# Patient Record
Sex: Male | Born: 1945 | Race: White | Hispanic: No | Marital: Married | Smoking: Never smoker
Health system: Southern US, Community
[De-identification: ages and names within clinical notes are randomized; demographics above are authoritative.]

## PROBLEM LIST (undated history)

## (undated) DIAGNOSIS — E785 Hyperlipidemia, unspecified: Secondary | ICD-10-CM

## (undated) DIAGNOSIS — K219 Gastro-esophageal reflux disease without esophagitis: Secondary | ICD-10-CM

## (undated) DIAGNOSIS — I2510 Atherosclerotic heart disease of native coronary artery without angina pectoris: Secondary | ICD-10-CM

## (undated) DIAGNOSIS — E119 Type 2 diabetes mellitus without complications: Secondary | ICD-10-CM

## (undated) DIAGNOSIS — D649 Anemia, unspecified: Secondary | ICD-10-CM

## (undated) DIAGNOSIS — G4700 Insomnia, unspecified: Secondary | ICD-10-CM

## (undated) DIAGNOSIS — I8390 Asymptomatic varicose veins of unspecified lower extremity: Secondary | ICD-10-CM

## (undated) DIAGNOSIS — R5383 Other fatigue: Secondary | ICD-10-CM

## (undated) DIAGNOSIS — I5030 Unspecified diastolic (congestive) heart failure: Secondary | ICD-10-CM

## (undated) DIAGNOSIS — R5381 Other malaise: Secondary | ICD-10-CM

## (undated) DIAGNOSIS — I639 Cerebral infarction, unspecified: Secondary | ICD-10-CM

## (undated) DIAGNOSIS — M545 Low back pain, unspecified: Secondary | ICD-10-CM

## (undated) DIAGNOSIS — I890 Lymphedema, not elsewhere classified: Secondary | ICD-10-CM

## (undated) DIAGNOSIS — J449 Chronic obstructive pulmonary disease, unspecified: Secondary | ICD-10-CM

## (undated) DIAGNOSIS — I10 Essential (primary) hypertension: Secondary | ICD-10-CM

## (undated) DIAGNOSIS — B029 Zoster without complications: Secondary | ICD-10-CM

## (undated) DIAGNOSIS — R079 Chest pain, unspecified: Secondary | ICD-10-CM

## (undated) DIAGNOSIS — E1140 Type 2 diabetes mellitus with diabetic neuropathy, unspecified: Secondary | ICD-10-CM

## (undated) DIAGNOSIS — J4489 Other specified chronic obstructive pulmonary disease: Secondary | ICD-10-CM

## (undated) HISTORY — DX: Anemia, unspecified: D64.9

## (undated) HISTORY — DX: Asymptomatic varicose veins of unspecified lower extremity: I83.90

## (undated) HISTORY — DX: Other fatigue: R53.81

## (undated) HISTORY — DX: Chest pain, unspecified: R07.9

## (undated) HISTORY — DX: Zoster without complications: B02.9

## (undated) HISTORY — DX: Other specified chronic obstructive pulmonary disease: J44.89

## (undated) HISTORY — DX: Gastro-esophageal reflux disease without esophagitis: K21.9

## (undated) HISTORY — DX: Low back pain, unspecified: M54.50

## (undated) HISTORY — DX: Chronic obstructive pulmonary disease, unspecified: J44.9

## (undated) HISTORY — DX: Atherosclerotic heart disease of native coronary artery without angina pectoris: I25.10

## (undated) HISTORY — DX: Essential (primary) hypertension: I10

## (undated) HISTORY — PX: CORONARY ARTERY BYPASS GRAFT: SHX141

## (undated) HISTORY — DX: Other fatigue: R53.83

## (undated) HISTORY — DX: Cerebral infarction, unspecified: I63.9

## (undated) HISTORY — DX: Low back pain: M54.5

## (undated) HISTORY — PX: CHOLECYSTECTOMY: SHX55

## (undated) HISTORY — DX: Type 2 diabetes mellitus with diabetic neuropathy, unspecified: E11.40

## (undated) HISTORY — DX: Hyperlipidemia, unspecified: E78.5

## (undated) HISTORY — DX: Insomnia, unspecified: G47.00

## (undated) HISTORY — DX: Type 2 diabetes mellitus without complications: E11.9

---

## 2003-12-28 ENCOUNTER — Emergency Department (HOSPITAL_COMMUNITY): Admission: EM | Admit: 2003-12-28 | Discharge: 2003-12-28 | Payer: Self-pay | Admitting: Emergency Medicine

## 2004-09-15 ENCOUNTER — Inpatient Hospital Stay: Payer: Self-pay | Admitting: Internal Medicine

## 2005-06-19 ENCOUNTER — Ambulatory Visit: Payer: Self-pay | Admitting: Family Medicine

## 2006-04-16 ENCOUNTER — Ambulatory Visit: Payer: Self-pay | Admitting: Cardiology

## 2006-05-03 ENCOUNTER — Ambulatory Visit: Payer: Self-pay

## 2007-04-05 ENCOUNTER — Encounter: Payer: Self-pay | Admitting: Unknown Physician Specialty

## 2007-04-06 ENCOUNTER — Encounter: Payer: Self-pay | Admitting: Unknown Physician Specialty

## 2007-05-06 ENCOUNTER — Encounter: Payer: Self-pay | Admitting: Unknown Physician Specialty

## 2007-05-21 ENCOUNTER — Ambulatory Visit: Payer: Self-pay | Admitting: Internal Medicine

## 2008-01-03 ENCOUNTER — Inpatient Hospital Stay: Payer: Self-pay | Admitting: Internal Medicine

## 2008-01-03 ENCOUNTER — Other Ambulatory Visit: Payer: Self-pay

## 2008-01-30 ENCOUNTER — Encounter: Payer: Self-pay | Admitting: Internal Medicine

## 2008-02-06 ENCOUNTER — Encounter: Payer: Self-pay | Admitting: Internal Medicine

## 2008-03-03 ENCOUNTER — Ambulatory Visit: Payer: Self-pay | Admitting: Internal Medicine

## 2008-04-03 ENCOUNTER — Encounter: Payer: Self-pay | Admitting: Internal Medicine

## 2008-04-05 ENCOUNTER — Encounter: Payer: Self-pay | Admitting: Internal Medicine

## 2008-05-05 ENCOUNTER — Encounter: Payer: Self-pay | Admitting: Internal Medicine

## 2008-06-05 ENCOUNTER — Encounter: Payer: Self-pay | Admitting: Internal Medicine

## 2008-10-12 ENCOUNTER — Ambulatory Visit: Payer: Self-pay | Admitting: Internal Medicine

## 2009-04-13 ENCOUNTER — Ambulatory Visit: Payer: Self-pay | Admitting: Unknown Physician Specialty

## 2009-10-03 ENCOUNTER — Emergency Department: Payer: Self-pay | Admitting: Internal Medicine

## 2009-10-06 ENCOUNTER — Inpatient Hospital Stay: Payer: Self-pay | Admitting: Internal Medicine

## 2011-06-29 DIAGNOSIS — E782 Mixed hyperlipidemia: Secondary | ICD-10-CM | POA: Diagnosis not present

## 2011-06-29 DIAGNOSIS — I509 Heart failure, unspecified: Secondary | ICD-10-CM | POA: Diagnosis not present

## 2011-06-29 DIAGNOSIS — E669 Obesity, unspecified: Secondary | ICD-10-CM | POA: Diagnosis not present

## 2011-06-29 DIAGNOSIS — I10 Essential (primary) hypertension: Secondary | ICD-10-CM | POA: Diagnosis not present

## 2011-06-29 DIAGNOSIS — E119 Type 2 diabetes mellitus without complications: Secondary | ICD-10-CM | POA: Diagnosis not present

## 2011-06-29 DIAGNOSIS — G4710 Hypersomnia, unspecified: Secondary | ICD-10-CM | POA: Diagnosis not present

## 2011-06-29 DIAGNOSIS — I8310 Varicose veins of unspecified lower extremity with inflammation: Secondary | ICD-10-CM | POA: Diagnosis not present

## 2011-06-29 DIAGNOSIS — E1142 Type 2 diabetes mellitus with diabetic polyneuropathy: Secondary | ICD-10-CM | POA: Diagnosis not present

## 2011-07-13 DIAGNOSIS — I509 Heart failure, unspecified: Secondary | ICD-10-CM | POA: Diagnosis not present

## 2011-07-13 DIAGNOSIS — R0602 Shortness of breath: Secondary | ICD-10-CM | POA: Diagnosis not present

## 2011-07-13 DIAGNOSIS — R059 Cough, unspecified: Secondary | ICD-10-CM | POA: Diagnosis not present

## 2011-07-13 DIAGNOSIS — I10 Essential (primary) hypertension: Secondary | ICD-10-CM | POA: Diagnosis not present

## 2011-07-13 DIAGNOSIS — J209 Acute bronchitis, unspecified: Secondary | ICD-10-CM | POA: Diagnosis not present

## 2011-07-13 DIAGNOSIS — R05 Cough: Secondary | ICD-10-CM | POA: Diagnosis not present

## 2011-07-13 DIAGNOSIS — R5381 Other malaise: Secondary | ICD-10-CM | POA: Diagnosis not present

## 2011-07-13 DIAGNOSIS — E119 Type 2 diabetes mellitus without complications: Secondary | ICD-10-CM | POA: Diagnosis not present

## 2011-07-20 DIAGNOSIS — J209 Acute bronchitis, unspecified: Secondary | ICD-10-CM | POA: Diagnosis not present

## 2011-07-20 DIAGNOSIS — R062 Wheezing: Secondary | ICD-10-CM | POA: Diagnosis not present

## 2011-07-20 DIAGNOSIS — E119 Type 2 diabetes mellitus without complications: Secondary | ICD-10-CM | POA: Diagnosis not present

## 2011-07-20 DIAGNOSIS — J441 Chronic obstructive pulmonary disease with (acute) exacerbation: Secondary | ICD-10-CM | POA: Diagnosis not present

## 2011-07-20 DIAGNOSIS — D619 Aplastic anemia, unspecified: Secondary | ICD-10-CM | POA: Diagnosis not present

## 2011-07-20 DIAGNOSIS — I10 Essential (primary) hypertension: Secondary | ICD-10-CM | POA: Diagnosis not present

## 2011-07-20 DIAGNOSIS — I25810 Atherosclerosis of coronary artery bypass graft(s) without angina pectoris: Secondary | ICD-10-CM | POA: Diagnosis not present

## 2011-10-05 ENCOUNTER — Ambulatory Visit: Payer: Self-pay

## 2011-10-05 DIAGNOSIS — R5383 Other fatigue: Secondary | ICD-10-CM | POA: Diagnosis not present

## 2011-10-05 DIAGNOSIS — R5381 Other malaise: Secondary | ICD-10-CM | POA: Diagnosis not present

## 2011-10-05 DIAGNOSIS — I509 Heart failure, unspecified: Secondary | ICD-10-CM | POA: Diagnosis not present

## 2011-10-05 DIAGNOSIS — G4710 Hypersomnia, unspecified: Secondary | ICD-10-CM | POA: Diagnosis not present

## 2011-10-05 DIAGNOSIS — I890 Lymphedema, not elsewhere classified: Secondary | ICD-10-CM | POA: Diagnosis not present

## 2011-10-05 DIAGNOSIS — G4730 Sleep apnea, unspecified: Secondary | ICD-10-CM | POA: Diagnosis not present

## 2011-10-05 DIAGNOSIS — E119 Type 2 diabetes mellitus without complications: Secondary | ICD-10-CM | POA: Diagnosis not present

## 2011-10-05 DIAGNOSIS — M7989 Other specified soft tissue disorders: Secondary | ICD-10-CM | POA: Diagnosis not present

## 2011-10-05 DIAGNOSIS — L02419 Cutaneous abscess of limb, unspecified: Secondary | ICD-10-CM | POA: Diagnosis not present

## 2011-10-05 DIAGNOSIS — J449 Chronic obstructive pulmonary disease, unspecified: Secondary | ICD-10-CM | POA: Diagnosis not present

## 2011-10-20 DIAGNOSIS — I8310 Varicose veins of unspecified lower extremity with inflammation: Secondary | ICD-10-CM | POA: Diagnosis not present

## 2011-10-20 DIAGNOSIS — I5042 Chronic combined systolic (congestive) and diastolic (congestive) heart failure: Secondary | ICD-10-CM | POA: Diagnosis not present

## 2011-10-20 DIAGNOSIS — I25810 Atherosclerosis of coronary artery bypass graft(s) without angina pectoris: Secondary | ICD-10-CM | POA: Diagnosis not present

## 2011-10-20 DIAGNOSIS — L03119 Cellulitis of unspecified part of limb: Secondary | ICD-10-CM | POA: Diagnosis not present

## 2011-10-20 DIAGNOSIS — I890 Lymphedema, not elsewhere classified: Secondary | ICD-10-CM | POA: Diagnosis not present

## 2011-10-20 DIAGNOSIS — R634 Abnormal weight loss: Secondary | ICD-10-CM | POA: Diagnosis not present

## 2011-10-20 DIAGNOSIS — I10 Essential (primary) hypertension: Secondary | ICD-10-CM | POA: Diagnosis not present

## 2011-10-20 DIAGNOSIS — E119 Type 2 diabetes mellitus without complications: Secondary | ICD-10-CM | POA: Diagnosis not present

## 2011-10-20 DIAGNOSIS — L02419 Cutaneous abscess of limb, unspecified: Secondary | ICD-10-CM | POA: Diagnosis not present

## 2011-10-27 DIAGNOSIS — I10 Essential (primary) hypertension: Secondary | ICD-10-CM | POA: Diagnosis not present

## 2011-10-27 DIAGNOSIS — E785 Hyperlipidemia, unspecified: Secondary | ICD-10-CM | POA: Diagnosis not present

## 2011-10-27 DIAGNOSIS — I2510 Atherosclerotic heart disease of native coronary artery without angina pectoris: Secondary | ICD-10-CM | POA: Diagnosis not present

## 2011-10-27 DIAGNOSIS — G4730 Sleep apnea, unspecified: Secondary | ICD-10-CM | POA: Diagnosis not present

## 2012-01-09 ENCOUNTER — Other Ambulatory Visit: Payer: Self-pay

## 2012-01-09 DIAGNOSIS — R5381 Other malaise: Secondary | ICD-10-CM | POA: Diagnosis not present

## 2012-01-09 DIAGNOSIS — D619 Aplastic anemia, unspecified: Secondary | ICD-10-CM | POA: Diagnosis not present

## 2012-01-09 DIAGNOSIS — I890 Lymphedema, not elsewhere classified: Secondary | ICD-10-CM | POA: Diagnosis not present

## 2012-01-09 DIAGNOSIS — I509 Heart failure, unspecified: Secondary | ICD-10-CM | POA: Diagnosis not present

## 2012-01-09 DIAGNOSIS — R0602 Shortness of breath: Secondary | ICD-10-CM | POA: Diagnosis not present

## 2012-01-09 DIAGNOSIS — L02419 Cutaneous abscess of limb, unspecified: Secondary | ICD-10-CM | POA: Diagnosis not present

## 2012-01-09 DIAGNOSIS — R5383 Other fatigue: Secondary | ICD-10-CM | POA: Diagnosis not present

## 2012-01-09 DIAGNOSIS — R609 Edema, unspecified: Secondary | ICD-10-CM | POA: Diagnosis not present

## 2012-01-09 DIAGNOSIS — J449 Chronic obstructive pulmonary disease, unspecified: Secondary | ICD-10-CM | POA: Diagnosis not present

## 2012-01-09 DIAGNOSIS — L03119 Cellulitis of unspecified part of limb: Secondary | ICD-10-CM | POA: Diagnosis not present

## 2012-01-09 DIAGNOSIS — I8310 Varicose veins of unspecified lower extremity with inflammation: Secondary | ICD-10-CM | POA: Diagnosis not present

## 2012-01-09 LAB — COMPREHENSIVE METABOLIC PANEL
Albumin: 3.6 g/dL (ref 3.4–5.0)
Anion Gap: 7 (ref 7–16)
BUN: 16 mg/dL (ref 7–18)
Bilirubin,Total: 1.2 mg/dL — ABNORMAL HIGH (ref 0.2–1.0)
Calcium, Total: 9 mg/dL (ref 8.5–10.1)
Chloride: 104 mmol/L (ref 98–107)
EGFR (African American): >60
Osmolality: 280 (ref 275–301)
Potassium: 4 mmol/L (ref 3.5–5.1)
SGOT(AST): 26 U/L (ref 15–37)
SGPT (ALT): 24 U/L (ref 12–78)

## 2012-01-09 LAB — CBC WITH DIFFERENTIAL/PLATELET
Basophil %: 0.2 %
Eosinophil #: 0.2 10*3/uL (ref 0.0–0.7)
Eosinophil %: 1.9 %
HCT: 43.2 % (ref 40.0–52.0)
HGB: 15.1 g/dL (ref 13.0–18.0)
Lymphocyte %: 18 %
MCHC: 35.1 g/dL (ref 32.0–36.0)
MCV: 94 fL (ref 80–100)
Monocyte %: 7.7 %
Neutrophil #: 6.8 10*3/uL — ABNORMAL HIGH (ref 1.4–6.5)
RBC: 4.57 10*6/uL (ref 4.40–5.90)
RDW: 13.5 % (ref 11.5–14.5)
WBC: 9.4 10*3/uL (ref 3.8–10.6)

## 2012-01-09 LAB — HEMOGLOBIN A1C: Hemoglobin A1C: 6.7 % — ABNORMAL HIGH (ref 4.2–6.3)

## 2012-01-09 LAB — PRO B NATRIURETIC PEPTIDE: B-Type Natriuretic Peptide: 49 pg/mL (ref 0–125)

## 2012-01-09 LAB — TSH: Thyroid Stimulating Horm: 4.8 u[IU]/mL — ABNORMAL HIGH

## 2012-01-09 LAB — FOLATE: Folic Acid: 18.1 ng/mL (ref 3.1–100.0)

## 2012-01-12 DIAGNOSIS — R0789 Other chest pain: Secondary | ICD-10-CM | POA: Diagnosis not present

## 2012-01-12 DIAGNOSIS — R0602 Shortness of breath: Secondary | ICD-10-CM | POA: Diagnosis not present

## 2012-01-12 DIAGNOSIS — R5381 Other malaise: Secondary | ICD-10-CM | POA: Diagnosis not present

## 2012-01-12 DIAGNOSIS — R5383 Other fatigue: Secondary | ICD-10-CM | POA: Diagnosis not present

## 2012-01-16 DIAGNOSIS — I359 Nonrheumatic aortic valve disorder, unspecified: Secondary | ICD-10-CM | POA: Diagnosis not present

## 2012-01-16 DIAGNOSIS — R5383 Other fatigue: Secondary | ICD-10-CM | POA: Diagnosis not present

## 2012-01-16 DIAGNOSIS — R609 Edema, unspecified: Secondary | ICD-10-CM | POA: Diagnosis not present

## 2012-01-16 DIAGNOSIS — R5381 Other malaise: Secondary | ICD-10-CM | POA: Diagnosis not present

## 2012-01-16 DIAGNOSIS — J449 Chronic obstructive pulmonary disease, unspecified: Secondary | ICD-10-CM | POA: Diagnosis not present

## 2012-01-16 DIAGNOSIS — R0602 Shortness of breath: Secondary | ICD-10-CM | POA: Diagnosis not present

## 2012-01-16 DIAGNOSIS — I890 Lymphedema, not elsewhere classified: Secondary | ICD-10-CM | POA: Diagnosis not present

## 2012-01-16 DIAGNOSIS — I10 Essential (primary) hypertension: Secondary | ICD-10-CM | POA: Diagnosis not present

## 2012-01-25 DIAGNOSIS — R0602 Shortness of breath: Secondary | ICD-10-CM | POA: Diagnosis not present

## 2012-01-25 DIAGNOSIS — I209 Angina pectoris, unspecified: Secondary | ICD-10-CM | POA: Diagnosis not present

## 2012-01-25 DIAGNOSIS — I119 Hypertensive heart disease without heart failure: Secondary | ICD-10-CM | POA: Diagnosis not present

## 2012-01-25 DIAGNOSIS — I2510 Atherosclerotic heart disease of native coronary artery without angina pectoris: Secondary | ICD-10-CM | POA: Diagnosis not present

## 2012-03-01 DIAGNOSIS — I2510 Atherosclerotic heart disease of native coronary artery without angina pectoris: Secondary | ICD-10-CM | POA: Diagnosis not present

## 2012-03-01 DIAGNOSIS — E782 Mixed hyperlipidemia: Secondary | ICD-10-CM | POA: Diagnosis not present

## 2012-03-01 DIAGNOSIS — I10 Essential (primary) hypertension: Secondary | ICD-10-CM | POA: Diagnosis not present

## 2012-03-01 DIAGNOSIS — E119 Type 2 diabetes mellitus without complications: Secondary | ICD-10-CM | POA: Diagnosis not present

## 2012-04-19 DIAGNOSIS — R5381 Other malaise: Secondary | ICD-10-CM | POA: Diagnosis not present

## 2012-04-19 DIAGNOSIS — M545 Low back pain, unspecified: Secondary | ICD-10-CM | POA: Diagnosis not present

## 2012-04-19 DIAGNOSIS — I890 Lymphedema, not elsewhere classified: Secondary | ICD-10-CM | POA: Diagnosis not present

## 2012-04-19 DIAGNOSIS — E1142 Type 2 diabetes mellitus with diabetic polyneuropathy: Secondary | ICD-10-CM | POA: Diagnosis not present

## 2012-04-19 DIAGNOSIS — E119 Type 2 diabetes mellitus without complications: Secondary | ICD-10-CM | POA: Diagnosis not present

## 2012-04-19 DIAGNOSIS — R609 Edema, unspecified: Secondary | ICD-10-CM | POA: Diagnosis not present

## 2012-04-26 DIAGNOSIS — H2510 Age-related nuclear cataract, unspecified eye: Secondary | ICD-10-CM | POA: Diagnosis not present

## 2012-04-26 DIAGNOSIS — E119 Type 2 diabetes mellitus without complications: Secondary | ICD-10-CM | POA: Diagnosis not present

## 2012-05-28 DIAGNOSIS — J45909 Unspecified asthma, uncomplicated: Secondary | ICD-10-CM | POA: Diagnosis not present

## 2012-05-28 DIAGNOSIS — E1142 Type 2 diabetes mellitus with diabetic polyneuropathy: Secondary | ICD-10-CM | POA: Diagnosis not present

## 2012-05-28 DIAGNOSIS — E119 Type 2 diabetes mellitus without complications: Secondary | ICD-10-CM | POA: Diagnosis not present

## 2012-05-28 DIAGNOSIS — I25810 Atherosclerosis of coronary artery bypass graft(s) without angina pectoris: Secondary | ICD-10-CM | POA: Diagnosis not present

## 2012-05-28 DIAGNOSIS — G4700 Insomnia, unspecified: Secondary | ICD-10-CM | POA: Diagnosis not present

## 2012-05-28 DIAGNOSIS — M159 Polyosteoarthritis, unspecified: Secondary | ICD-10-CM | POA: Diagnosis not present

## 2012-05-28 DIAGNOSIS — I10 Essential (primary) hypertension: Secondary | ICD-10-CM | POA: Diagnosis not present

## 2012-06-20 DIAGNOSIS — E785 Hyperlipidemia, unspecified: Secondary | ICD-10-CM | POA: Diagnosis not present

## 2012-06-20 DIAGNOSIS — E119 Type 2 diabetes mellitus without complications: Secondary | ICD-10-CM | POA: Diagnosis not present

## 2012-06-20 DIAGNOSIS — I10 Essential (primary) hypertension: Secondary | ICD-10-CM | POA: Diagnosis not present

## 2012-06-20 DIAGNOSIS — I2510 Atherosclerotic heart disease of native coronary artery without angina pectoris: Secondary | ICD-10-CM | POA: Diagnosis not present

## 2012-08-23 DIAGNOSIS — I10 Essential (primary) hypertension: Secondary | ICD-10-CM | POA: Diagnosis not present

## 2012-08-23 DIAGNOSIS — J45909 Unspecified asthma, uncomplicated: Secondary | ICD-10-CM | POA: Diagnosis not present

## 2012-08-23 DIAGNOSIS — E119 Type 2 diabetes mellitus without complications: Secondary | ICD-10-CM | POA: Diagnosis not present

## 2012-08-23 DIAGNOSIS — R5381 Other malaise: Secondary | ICD-10-CM | POA: Diagnosis not present

## 2012-08-23 DIAGNOSIS — G4710 Hypersomnia, unspecified: Secondary | ICD-10-CM | POA: Diagnosis not present

## 2012-08-23 DIAGNOSIS — J069 Acute upper respiratory infection, unspecified: Secondary | ICD-10-CM | POA: Diagnosis not present

## 2012-08-23 DIAGNOSIS — R0602 Shortness of breath: Secondary | ICD-10-CM | POA: Diagnosis not present

## 2012-08-27 DIAGNOSIS — R5381 Other malaise: Secondary | ICD-10-CM | POA: Diagnosis not present

## 2012-09-02 DIAGNOSIS — Z006 Encounter for examination for normal comparison and control in clinical research program: Secondary | ICD-10-CM | POA: Diagnosis not present

## 2012-09-02 DIAGNOSIS — G4730 Sleep apnea, unspecified: Secondary | ICD-10-CM | POA: Diagnosis not present

## 2012-09-02 DIAGNOSIS — R05 Cough: Secondary | ICD-10-CM | POA: Diagnosis not present

## 2012-09-02 DIAGNOSIS — J449 Chronic obstructive pulmonary disease, unspecified: Secondary | ICD-10-CM | POA: Diagnosis not present

## 2012-09-02 DIAGNOSIS — I10 Essential (primary) hypertension: Secondary | ICD-10-CM | POA: Diagnosis not present

## 2012-09-02 DIAGNOSIS — G4710 Hypersomnia, unspecified: Secondary | ICD-10-CM | POA: Diagnosis not present

## 2012-09-02 DIAGNOSIS — R609 Edema, unspecified: Secondary | ICD-10-CM | POA: Diagnosis not present

## 2012-09-02 DIAGNOSIS — R059 Cough, unspecified: Secondary | ICD-10-CM | POA: Diagnosis not present

## 2012-09-10 DIAGNOSIS — G4720 Circadian rhythm sleep disorder, unspecified type: Secondary | ICD-10-CM | POA: Diagnosis not present

## 2012-09-10 DIAGNOSIS — G4710 Hypersomnia, unspecified: Secondary | ICD-10-CM | POA: Diagnosis not present

## 2012-10-01 DIAGNOSIS — R5381 Other malaise: Secondary | ICD-10-CM | POA: Diagnosis not present

## 2012-10-01 DIAGNOSIS — E669 Obesity, unspecified: Secondary | ICD-10-CM | POA: Diagnosis not present

## 2012-10-01 DIAGNOSIS — J069 Acute upper respiratory infection, unspecified: Secondary | ICD-10-CM | POA: Diagnosis not present

## 2012-10-01 DIAGNOSIS — I10 Essential (primary) hypertension: Secondary | ICD-10-CM | POA: Diagnosis not present

## 2012-10-01 DIAGNOSIS — J449 Chronic obstructive pulmonary disease, unspecified: Secondary | ICD-10-CM | POA: Diagnosis not present

## 2012-10-01 DIAGNOSIS — I25810 Atherosclerosis of coronary artery bypass graft(s) without angina pectoris: Secondary | ICD-10-CM | POA: Diagnosis not present

## 2012-10-01 DIAGNOSIS — R5383 Other fatigue: Secondary | ICD-10-CM | POA: Diagnosis not present

## 2012-10-01 DIAGNOSIS — R609 Edema, unspecified: Secondary | ICD-10-CM | POA: Diagnosis not present

## 2012-10-01 DIAGNOSIS — E1142 Type 2 diabetes mellitus with diabetic polyneuropathy: Secondary | ICD-10-CM | POA: Diagnosis not present

## 2012-11-07 DIAGNOSIS — G4730 Sleep apnea, unspecified: Secondary | ICD-10-CM | POA: Diagnosis not present

## 2012-11-07 DIAGNOSIS — G4710 Hypersomnia, unspecified: Secondary | ICD-10-CM | POA: Diagnosis not present

## 2012-11-07 DIAGNOSIS — G4720 Circadian rhythm sleep disorder, unspecified type: Secondary | ICD-10-CM | POA: Diagnosis not present

## 2012-11-07 DIAGNOSIS — J449 Chronic obstructive pulmonary disease, unspecified: Secondary | ICD-10-CM | POA: Diagnosis not present

## 2012-11-22 DIAGNOSIS — E1142 Type 2 diabetes mellitus with diabetic polyneuropathy: Secondary | ICD-10-CM | POA: Diagnosis not present

## 2012-11-22 DIAGNOSIS — L02419 Cutaneous abscess of limb, unspecified: Secondary | ICD-10-CM | POA: Diagnosis not present

## 2012-11-22 DIAGNOSIS — E119 Type 2 diabetes mellitus without complications: Secondary | ICD-10-CM | POA: Diagnosis not present

## 2012-11-22 DIAGNOSIS — R0602 Shortness of breath: Secondary | ICD-10-CM | POA: Diagnosis not present

## 2012-11-22 DIAGNOSIS — R0789 Other chest pain: Secondary | ICD-10-CM | POA: Diagnosis not present

## 2012-11-22 DIAGNOSIS — L03119 Cellulitis of unspecified part of limb: Secondary | ICD-10-CM | POA: Diagnosis not present

## 2012-12-03 DIAGNOSIS — I10 Essential (primary) hypertension: Secondary | ICD-10-CM | POA: Diagnosis not present

## 2012-12-03 DIAGNOSIS — L03119 Cellulitis of unspecified part of limb: Secondary | ICD-10-CM | POA: Diagnosis not present

## 2012-12-03 DIAGNOSIS — E1142 Type 2 diabetes mellitus with diabetic polyneuropathy: Secondary | ICD-10-CM | POA: Diagnosis not present

## 2012-12-03 DIAGNOSIS — G4700 Insomnia, unspecified: Secondary | ICD-10-CM | POA: Diagnosis not present

## 2012-12-03 DIAGNOSIS — E119 Type 2 diabetes mellitus without complications: Secondary | ICD-10-CM | POA: Diagnosis not present

## 2012-12-03 DIAGNOSIS — R5381 Other malaise: Secondary | ICD-10-CM | POA: Diagnosis not present

## 2012-12-03 DIAGNOSIS — R609 Edema, unspecified: Secondary | ICD-10-CM | POA: Diagnosis not present

## 2012-12-03 DIAGNOSIS — L02419 Cutaneous abscess of limb, unspecified: Secondary | ICD-10-CM | POA: Diagnosis not present

## 2012-12-03 DIAGNOSIS — J449 Chronic obstructive pulmonary disease, unspecified: Secondary | ICD-10-CM | POA: Diagnosis not present

## 2012-12-03 DIAGNOSIS — J441 Chronic obstructive pulmonary disease with (acute) exacerbation: Secondary | ICD-10-CM | POA: Diagnosis not present

## 2012-12-03 DIAGNOSIS — R5383 Other fatigue: Secondary | ICD-10-CM | POA: Diagnosis not present

## 2012-12-24 DIAGNOSIS — I25810 Atherosclerosis of coronary artery bypass graft(s) without angina pectoris: Secondary | ICD-10-CM | POA: Diagnosis not present

## 2012-12-24 DIAGNOSIS — E1142 Type 2 diabetes mellitus with diabetic polyneuropathy: Secondary | ICD-10-CM | POA: Diagnosis not present

## 2012-12-24 DIAGNOSIS — I10 Essential (primary) hypertension: Secondary | ICD-10-CM | POA: Diagnosis not present

## 2012-12-24 DIAGNOSIS — J441 Chronic obstructive pulmonary disease with (acute) exacerbation: Secondary | ICD-10-CM | POA: Diagnosis not present

## 2012-12-24 DIAGNOSIS — R5381 Other malaise: Secondary | ICD-10-CM | POA: Diagnosis not present

## 2012-12-24 DIAGNOSIS — R062 Wheezing: Secondary | ICD-10-CM | POA: Diagnosis not present

## 2012-12-24 DIAGNOSIS — R5383 Other fatigue: Secondary | ICD-10-CM | POA: Diagnosis not present

## 2012-12-24 DIAGNOSIS — J209 Acute bronchitis, unspecified: Secondary | ICD-10-CM | POA: Diagnosis not present

## 2012-12-24 DIAGNOSIS — R0602 Shortness of breath: Secondary | ICD-10-CM | POA: Diagnosis not present

## 2012-12-31 DIAGNOSIS — R609 Edema, unspecified: Secondary | ICD-10-CM | POA: Diagnosis not present

## 2012-12-31 DIAGNOSIS — I10 Essential (primary) hypertension: Secondary | ICD-10-CM | POA: Diagnosis not present

## 2012-12-31 DIAGNOSIS — E785 Hyperlipidemia, unspecified: Secondary | ICD-10-CM | POA: Diagnosis not present

## 2012-12-31 DIAGNOSIS — I2510 Atherosclerotic heart disease of native coronary artery without angina pectoris: Secondary | ICD-10-CM | POA: Diagnosis not present

## 2013-01-01 ENCOUNTER — Inpatient Hospital Stay: Payer: Self-pay | Admitting: Internal Medicine

## 2013-01-01 DIAGNOSIS — Z7982 Long term (current) use of aspirin: Secondary | ICD-10-CM | POA: Diagnosis not present

## 2013-01-01 DIAGNOSIS — J440 Chronic obstructive pulmonary disease with acute lower respiratory infection: Secondary | ICD-10-CM | POA: Diagnosis not present

## 2013-01-01 DIAGNOSIS — J441 Chronic obstructive pulmonary disease with (acute) exacerbation: Secondary | ICD-10-CM | POA: Diagnosis not present

## 2013-01-01 DIAGNOSIS — Z951 Presence of aortocoronary bypass graft: Secondary | ICD-10-CM | POA: Diagnosis not present

## 2013-01-01 DIAGNOSIS — I2510 Atherosclerotic heart disease of native coronary artery without angina pectoris: Secondary | ICD-10-CM | POA: Diagnosis present

## 2013-01-01 DIAGNOSIS — J189 Pneumonia, unspecified organism: Secondary | ICD-10-CM | POA: Diagnosis not present

## 2013-01-01 DIAGNOSIS — R079 Chest pain, unspecified: Secondary | ICD-10-CM | POA: Diagnosis not present

## 2013-01-01 DIAGNOSIS — Z8673 Personal history of transient ischemic attack (TIA), and cerebral infarction without residual deficits: Secondary | ICD-10-CM | POA: Diagnosis not present

## 2013-01-01 DIAGNOSIS — Z79899 Other long term (current) drug therapy: Secondary | ICD-10-CM | POA: Diagnosis not present

## 2013-01-01 DIAGNOSIS — R7881 Bacteremia: Secondary | ICD-10-CM | POA: Diagnosis not present

## 2013-01-01 DIAGNOSIS — D619 Aplastic anemia, unspecified: Secondary | ICD-10-CM | POA: Diagnosis not present

## 2013-01-01 DIAGNOSIS — F40298 Other specified phobia: Secondary | ICD-10-CM | POA: Diagnosis present

## 2013-01-01 DIAGNOSIS — J45909 Unspecified asthma, uncomplicated: Secondary | ICD-10-CM | POA: Diagnosis not present

## 2013-01-01 DIAGNOSIS — J209 Acute bronchitis, unspecified: Secondary | ICD-10-CM | POA: Diagnosis not present

## 2013-01-01 DIAGNOSIS — G4733 Obstructive sleep apnea (adult) (pediatric): Secondary | ICD-10-CM | POA: Diagnosis present

## 2013-01-01 DIAGNOSIS — Z6841 Body Mass Index (BMI) 40.0 and over, adult: Secondary | ICD-10-CM | POA: Diagnosis not present

## 2013-01-01 DIAGNOSIS — R197 Diarrhea, unspecified: Secondary | ICD-10-CM | POA: Diagnosis present

## 2013-01-01 DIAGNOSIS — J479 Bronchiectasis, uncomplicated: Secondary | ICD-10-CM | POA: Diagnosis present

## 2013-01-01 DIAGNOSIS — I129 Hypertensive chronic kidney disease with stage 1 through stage 4 chronic kidney disease, or unspecified chronic kidney disease: Secondary | ICD-10-CM | POA: Diagnosis present

## 2013-01-01 DIAGNOSIS — N179 Acute kidney failure, unspecified: Secondary | ICD-10-CM | POA: Diagnosis not present

## 2013-01-01 DIAGNOSIS — E119 Type 2 diabetes mellitus without complications: Secondary | ICD-10-CM | POA: Diagnosis not present

## 2013-01-01 DIAGNOSIS — E785 Hyperlipidemia, unspecified: Secondary | ICD-10-CM | POA: Diagnosis not present

## 2013-01-01 DIAGNOSIS — J310 Chronic rhinitis: Secondary | ICD-10-CM | POA: Diagnosis present

## 2013-01-01 DIAGNOSIS — R0602 Shortness of breath: Secondary | ICD-10-CM | POA: Diagnosis not present

## 2013-01-01 DIAGNOSIS — Z87891 Personal history of nicotine dependence: Secondary | ICD-10-CM | POA: Diagnosis not present

## 2013-01-01 DIAGNOSIS — I2789 Other specified pulmonary heart diseases: Secondary | ICD-10-CM | POA: Diagnosis not present

## 2013-01-01 DIAGNOSIS — E669 Obesity, unspecified: Secondary | ICD-10-CM | POA: Diagnosis present

## 2013-01-01 DIAGNOSIS — N189 Chronic kidney disease, unspecified: Secondary | ICD-10-CM | POA: Diagnosis present

## 2013-01-01 DIAGNOSIS — Z8249 Family history of ischemic heart disease and other diseases of the circulatory system: Secondary | ICD-10-CM | POA: Diagnosis not present

## 2013-01-01 DIAGNOSIS — I10 Essential (primary) hypertension: Secondary | ICD-10-CM | POA: Diagnosis not present

## 2013-01-01 DIAGNOSIS — I509 Heart failure, unspecified: Secondary | ICD-10-CM | POA: Diagnosis not present

## 2013-01-01 LAB — BASIC METABOLIC PANEL
BUN: 18 mg/dL (ref 7–18)
Calcium, Total: 9.2 mg/dL (ref 8.5–10.1)
Chloride: 104 mmol/L (ref 98–107)
Co2: 26 mmol/L (ref 21–32)
EGFR (Non-African Amer.): 34 — ABNORMAL LOW
Glucose: 128 mg/dL — ABNORMAL HIGH (ref 65–99)
Osmolality: 277 (ref 275–301)
Potassium: 4.1 mmol/L (ref 3.5–5.1)

## 2013-01-01 LAB — CBC
HCT: 36.1 % — ABNORMAL LOW (ref 40.0–52.0)
HGB: 12.1 g/dL — ABNORMAL LOW (ref 13.0–18.0)
MCH: 30.6 pg (ref 26.0–34.0)
RBC: 3.96 10*6/uL — ABNORMAL LOW (ref 4.40–5.90)
RDW: 13.3 % (ref 11.5–14.5)
WBC: 11.6 10*3/uL — ABNORMAL HIGH (ref 3.8–10.6)

## 2013-01-01 LAB — TROPONIN I: Troponin-I: <0.02 ng/mL

## 2013-01-02 LAB — LIPID PANEL
HDL Cholesterol: 26 mg/dL — ABNORMAL LOW (ref 40–60)
Ldl Cholesterol, Calc: 64 mg/dL (ref 0–100)
VLDL Cholesterol, Calc: 14 mg/dL (ref 5–40)

## 2013-01-02 LAB — CBC WITH DIFFERENTIAL/PLATELET
Basophil #: 0 10*3/uL (ref 0.0–0.1)
Basophil %: 0.2 %
Eosinophil #: 0 10*3/uL (ref 0.0–0.7)
HCT: 34.4 % — ABNORMAL LOW (ref 40.0–52.0)
MCHC: 34.9 g/dL (ref 32.0–36.0)
Monocyte #: 0.1 x10 3/mm — ABNORMAL LOW (ref 0.2–1.0)
Monocyte %: 1 %
Neutrophil #: 9.6 10*3/uL — ABNORMAL HIGH (ref 1.4–6.5)

## 2013-01-02 LAB — BASIC METABOLIC PANEL
Anion Gap: 8 (ref 7–16)
BUN: 19 mg/dL — ABNORMAL HIGH (ref 7–18)
Calcium, Total: 9.2 mg/dL (ref 8.5–10.1)
Co2: 26 mmol/L (ref 21–32)
Creatinine: 1.81 mg/dL — ABNORMAL HIGH (ref 0.60–1.30)
EGFR (African American): 44 — ABNORMAL LOW
Osmolality: 281 (ref 275–301)
Sodium: 136 mmol/L (ref 136–145)

## 2013-01-02 LAB — TROPONIN I
Troponin-I: <0.02 ng/mL
Troponin-I: <0.02 ng/mL

## 2013-01-03 LAB — BASIC METABOLIC PANEL
BUN: 24 mg/dL — ABNORMAL HIGH (ref 7–18)
Creatinine: 1.56 mg/dL — ABNORMAL HIGH (ref 0.60–1.30)
EGFR (African American): 52 — ABNORMAL LOW
EGFR (Non-African Amer.): 45 — ABNORMAL LOW
Glucose: 180 mg/dL — ABNORMAL HIGH (ref 65–99)
Potassium: 4.6 mmol/L (ref 3.5–5.1)

## 2013-01-03 LAB — TSH: Thyroid Stimulating Horm: 1.31 u[IU]/mL

## 2013-01-04 LAB — BASIC METABOLIC PANEL
Anion Gap: 4 — ABNORMAL LOW (ref 7–16)
BUN: 24 mg/dL — ABNORMAL HIGH (ref 7–18)
Chloride: 103 mmol/L (ref 98–107)
Co2: 30 mmol/L (ref 21–32)
EGFR (African American): 54 — ABNORMAL LOW
EGFR (Non-African Amer.): 47 — ABNORMAL LOW
Potassium: 4.4 mmol/L (ref 3.5–5.1)
Sodium: 137 mmol/L (ref 136–145)

## 2013-01-04 LAB — CBC WITH DIFFERENTIAL/PLATELET
Basophil #: 0 10*3/uL (ref 0.0–0.1)
Basophil %: 0.1 %
Eosinophil #: 0 10*3/uL (ref 0.0–0.7)
HGB: 11.7 g/dL — ABNORMAL LOW (ref 13.0–18.0)
Lymphocyte #: 0.6 10*3/uL — ABNORMAL LOW (ref 1.0–3.6)
Lymphocyte %: 4.1 %
MCH: 31 pg (ref 26.0–34.0)
MCHC: 34.1 g/dL (ref 32.0–36.0)
Monocyte #: 0.5 x10 3/mm (ref 0.2–1.0)
Monocyte %: 3.7 %
Neutrophil #: 12.8 10*3/uL — ABNORMAL HIGH (ref 1.4–6.5)
Neutrophil %: 92 %
RBC: 3.77 10*6/uL — ABNORMAL LOW (ref 4.40–5.90)
RDW: 13.4 % (ref 11.5–14.5)

## 2013-01-04 LAB — CULTURE, BLOOD (SINGLE)

## 2013-01-06 LAB — CULTURE, BLOOD (SINGLE)

## 2013-01-07 DIAGNOSIS — G4730 Sleep apnea, unspecified: Secondary | ICD-10-CM | POA: Diagnosis not present

## 2013-01-07 DIAGNOSIS — R0602 Shortness of breath: Secondary | ICD-10-CM | POA: Diagnosis not present

## 2013-01-07 DIAGNOSIS — G4710 Hypersomnia, unspecified: Secondary | ICD-10-CM | POA: Diagnosis not present

## 2013-01-07 DIAGNOSIS — J440 Chronic obstructive pulmonary disease with acute lower respiratory infection: Secondary | ICD-10-CM | POA: Diagnosis not present

## 2013-01-08 LAB — CULTURE, BLOOD (SINGLE)

## 2013-01-17 ENCOUNTER — Ambulatory Visit: Payer: Self-pay | Admitting: Internal Medicine

## 2013-01-17 ENCOUNTER — Other Ambulatory Visit: Payer: Self-pay | Admitting: Physician Assistant

## 2013-01-17 DIAGNOSIS — J189 Pneumonia, unspecified organism: Secondary | ICD-10-CM | POA: Diagnosis not present

## 2013-01-17 DIAGNOSIS — J984 Other disorders of lung: Secondary | ICD-10-CM | POA: Diagnosis not present

## 2013-01-17 DIAGNOSIS — E860 Dehydration: Secondary | ICD-10-CM | POA: Diagnosis not present

## 2013-01-17 DIAGNOSIS — R059 Cough, unspecified: Secondary | ICD-10-CM | POA: Diagnosis not present

## 2013-01-17 LAB — CBC WITH DIFFERENTIAL/PLATELET
Basophil %: 0.4 %
Eosinophil #: 0.3 10*3/uL (ref 0.0–0.7)
Eosinophil %: 2.4 %
HGB: 11.9 g/dL — ABNORMAL LOW (ref 13.0–18.0)
Lymphocyte %: 13.9 %
MCH: 31 pg (ref 26.0–34.0)
MCHC: 34.4 g/dL (ref 32.0–36.0)
MCV: 90 fL (ref 80–100)
Monocyte %: 8.1 %
Neutrophil #: 8 10*3/uL — ABNORMAL HIGH (ref 1.4–6.5)
Neutrophil %: 75.2 %
RDW: 13.8 % (ref 11.5–14.5)
WBC: 10.6 10*3/uL (ref 3.8–10.6)

## 2013-01-17 LAB — COMPREHENSIVE METABOLIC PANEL
Albumin: 3 g/dL — ABNORMAL LOW (ref 3.4–5.0)
Alkaline Phosphatase: 118 U/L (ref 50–136)
Anion Gap: 4 — ABNORMAL LOW (ref 7–16)
BUN: 11 mg/dL (ref 7–18)
Calcium, Total: 9 mg/dL (ref 8.5–10.1)
Chloride: 103 mmol/L (ref 98–107)
Co2: 29 mmol/L (ref 21–32)
EGFR (African American): 57 — ABNORMAL LOW
SGPT (ALT): 26 U/L (ref 12–78)
Sodium: 136 mmol/L (ref 136–145)
Total Protein: 6.8 g/dL (ref 6.4–8.2)

## 2013-01-20 DIAGNOSIS — J45909 Unspecified asthma, uncomplicated: Secondary | ICD-10-CM | POA: Diagnosis not present

## 2013-01-20 DIAGNOSIS — G4733 Obstructive sleep apnea (adult) (pediatric): Secondary | ICD-10-CM | POA: Diagnosis not present

## 2013-01-21 DIAGNOSIS — G4710 Hypersomnia, unspecified: Secondary | ICD-10-CM | POA: Diagnosis not present

## 2013-01-21 DIAGNOSIS — E1159 Type 2 diabetes mellitus with other circulatory complications: Secondary | ICD-10-CM | POA: Diagnosis not present

## 2013-01-21 DIAGNOSIS — J449 Chronic obstructive pulmonary disease, unspecified: Secondary | ICD-10-CM | POA: Diagnosis not present

## 2013-01-30 DIAGNOSIS — J440 Chronic obstructive pulmonary disease with acute lower respiratory infection: Secondary | ICD-10-CM | POA: Diagnosis not present

## 2013-01-30 DIAGNOSIS — E1142 Type 2 diabetes mellitus with diabetic polyneuropathy: Secondary | ICD-10-CM | POA: Diagnosis not present

## 2013-01-30 DIAGNOSIS — R5381 Other malaise: Secondary | ICD-10-CM | POA: Diagnosis not present

## 2013-01-30 DIAGNOSIS — I8310 Varicose veins of unspecified lower extremity with inflammation: Secondary | ICD-10-CM | POA: Diagnosis not present

## 2013-01-30 DIAGNOSIS — L0291 Cutaneous abscess, unspecified: Secondary | ICD-10-CM | POA: Diagnosis not present

## 2013-01-30 DIAGNOSIS — E119 Type 2 diabetes mellitus without complications: Secondary | ICD-10-CM | POA: Diagnosis not present

## 2013-02-05 DIAGNOSIS — R0602 Shortness of breath: Secondary | ICD-10-CM | POA: Diagnosis not present

## 2013-02-24 DIAGNOSIS — IMO0001 Reserved for inherently not codable concepts without codable children: Secondary | ICD-10-CM | POA: Diagnosis not present

## 2013-02-24 DIAGNOSIS — R609 Edema, unspecified: Secondary | ICD-10-CM | POA: Diagnosis not present

## 2013-02-24 DIAGNOSIS — J440 Chronic obstructive pulmonary disease with acute lower respiratory infection: Secondary | ICD-10-CM | POA: Diagnosis not present

## 2013-02-24 DIAGNOSIS — R5381 Other malaise: Secondary | ICD-10-CM | POA: Diagnosis not present

## 2013-02-24 DIAGNOSIS — I8310 Varicose veins of unspecified lower extremity with inflammation: Secondary | ICD-10-CM | POA: Diagnosis not present

## 2013-02-24 DIAGNOSIS — E1142 Type 2 diabetes mellitus with diabetic polyneuropathy: Secondary | ICD-10-CM | POA: Diagnosis not present

## 2013-02-24 DIAGNOSIS — L02419 Cutaneous abscess of limb, unspecified: Secondary | ICD-10-CM | POA: Diagnosis not present

## 2013-03-05 DIAGNOSIS — I890 Lymphedema, not elsewhere classified: Secondary | ICD-10-CM | POA: Diagnosis not present

## 2013-03-05 DIAGNOSIS — L02419 Cutaneous abscess of limb, unspecified: Secondary | ICD-10-CM | POA: Diagnosis not present

## 2013-03-05 DIAGNOSIS — J449 Chronic obstructive pulmonary disease, unspecified: Secondary | ICD-10-CM | POA: Diagnosis not present

## 2013-03-05 DIAGNOSIS — R0602 Shortness of breath: Secondary | ICD-10-CM | POA: Diagnosis not present

## 2013-03-05 DIAGNOSIS — R5381 Other malaise: Secondary | ICD-10-CM | POA: Diagnosis not present

## 2013-03-05 DIAGNOSIS — IMO0001 Reserved for inherently not codable concepts without codable children: Secondary | ICD-10-CM | POA: Diagnosis not present

## 2013-03-05 DIAGNOSIS — I25810 Atherosclerosis of coronary artery bypass graft(s) without angina pectoris: Secondary | ICD-10-CM | POA: Diagnosis not present

## 2013-03-05 DIAGNOSIS — I10 Essential (primary) hypertension: Secondary | ICD-10-CM | POA: Diagnosis not present

## 2013-03-13 DIAGNOSIS — E782 Mixed hyperlipidemia: Secondary | ICD-10-CM | POA: Diagnosis not present

## 2013-03-13 DIAGNOSIS — E1142 Type 2 diabetes mellitus with diabetic polyneuropathy: Secondary | ICD-10-CM | POA: Diagnosis not present

## 2013-03-13 DIAGNOSIS — E1159 Type 2 diabetes mellitus with other circulatory complications: Secondary | ICD-10-CM | POA: Diagnosis not present

## 2013-03-13 DIAGNOSIS — E669 Obesity, unspecified: Secondary | ICD-10-CM | POA: Diagnosis not present

## 2013-03-13 DIAGNOSIS — I10 Essential (primary) hypertension: Secondary | ICD-10-CM | POA: Diagnosis not present

## 2013-03-18 DIAGNOSIS — E782 Mixed hyperlipidemia: Secondary | ICD-10-CM | POA: Diagnosis not present

## 2013-03-18 DIAGNOSIS — E669 Obesity, unspecified: Secondary | ICD-10-CM | POA: Diagnosis not present

## 2013-03-18 DIAGNOSIS — E1159 Type 2 diabetes mellitus with other circulatory complications: Secondary | ICD-10-CM | POA: Diagnosis not present

## 2013-03-18 DIAGNOSIS — E1142 Type 2 diabetes mellitus with diabetic polyneuropathy: Secondary | ICD-10-CM | POA: Diagnosis not present

## 2013-03-18 DIAGNOSIS — I10 Essential (primary) hypertension: Secondary | ICD-10-CM | POA: Diagnosis not present

## 2013-03-25 DIAGNOSIS — E782 Mixed hyperlipidemia: Secondary | ICD-10-CM | POA: Diagnosis not present

## 2013-03-25 DIAGNOSIS — E1159 Type 2 diabetes mellitus with other circulatory complications: Secondary | ICD-10-CM | POA: Diagnosis not present

## 2013-03-25 DIAGNOSIS — E669 Obesity, unspecified: Secondary | ICD-10-CM | POA: Diagnosis not present

## 2013-03-25 DIAGNOSIS — E1142 Type 2 diabetes mellitus with diabetic polyneuropathy: Secondary | ICD-10-CM | POA: Diagnosis not present

## 2013-03-25 DIAGNOSIS — I10 Essential (primary) hypertension: Secondary | ICD-10-CM | POA: Diagnosis not present

## 2013-04-03 DIAGNOSIS — E782 Mixed hyperlipidemia: Secondary | ICD-10-CM | POA: Diagnosis not present

## 2013-04-03 DIAGNOSIS — E669 Obesity, unspecified: Secondary | ICD-10-CM | POA: Diagnosis not present

## 2013-04-03 DIAGNOSIS — E1159 Type 2 diabetes mellitus with other circulatory complications: Secondary | ICD-10-CM | POA: Diagnosis not present

## 2013-04-03 DIAGNOSIS — E1142 Type 2 diabetes mellitus with diabetic polyneuropathy: Secondary | ICD-10-CM | POA: Diagnosis not present

## 2013-04-03 DIAGNOSIS — I10 Essential (primary) hypertension: Secondary | ICD-10-CM | POA: Diagnosis not present

## 2013-04-04 DIAGNOSIS — D518 Other vitamin B12 deficiency anemias: Secondary | ICD-10-CM | POA: Diagnosis not present

## 2013-04-04 DIAGNOSIS — R7989 Other specified abnormal findings of blood chemistry: Secondary | ICD-10-CM | POA: Diagnosis not present

## 2013-04-04 DIAGNOSIS — D643 Other sideroblastic anemias: Secondary | ICD-10-CM | POA: Diagnosis not present

## 2013-04-04 DIAGNOSIS — M129 Arthropathy, unspecified: Secondary | ICD-10-CM | POA: Diagnosis not present

## 2013-04-04 DIAGNOSIS — D649 Anemia, unspecified: Secondary | ICD-10-CM | POA: Diagnosis not present

## 2013-04-08 DIAGNOSIS — I10 Essential (primary) hypertension: Secondary | ICD-10-CM | POA: Diagnosis not present

## 2013-04-08 DIAGNOSIS — R609 Edema, unspecified: Secondary | ICD-10-CM | POA: Diagnosis not present

## 2013-04-08 DIAGNOSIS — I2510 Atherosclerotic heart disease of native coronary artery without angina pectoris: Secondary | ICD-10-CM | POA: Diagnosis not present

## 2013-04-08 DIAGNOSIS — E785 Hyperlipidemia, unspecified: Secondary | ICD-10-CM | POA: Diagnosis not present

## 2013-04-14 DIAGNOSIS — R609 Edema, unspecified: Secondary | ICD-10-CM | POA: Diagnosis not present

## 2013-04-14 DIAGNOSIS — E1159 Type 2 diabetes mellitus with other circulatory complications: Secondary | ICD-10-CM | POA: Diagnosis not present

## 2013-04-15 DIAGNOSIS — E669 Obesity, unspecified: Secondary | ICD-10-CM | POA: Diagnosis not present

## 2013-04-15 DIAGNOSIS — E1142 Type 2 diabetes mellitus with diabetic polyneuropathy: Secondary | ICD-10-CM | POA: Diagnosis not present

## 2013-04-15 DIAGNOSIS — E782 Mixed hyperlipidemia: Secondary | ICD-10-CM | POA: Diagnosis not present

## 2013-04-15 DIAGNOSIS — I10 Essential (primary) hypertension: Secondary | ICD-10-CM | POA: Diagnosis not present

## 2013-04-15 DIAGNOSIS — E1159 Type 2 diabetes mellitus with other circulatory complications: Secondary | ICD-10-CM | POA: Diagnosis not present

## 2013-05-12 DIAGNOSIS — J449 Chronic obstructive pulmonary disease, unspecified: Secondary | ICD-10-CM | POA: Diagnosis not present

## 2013-05-12 DIAGNOSIS — G4710 Hypersomnia, unspecified: Secondary | ICD-10-CM | POA: Diagnosis not present

## 2013-05-12 DIAGNOSIS — I10 Essential (primary) hypertension: Secondary | ICD-10-CM | POA: Diagnosis not present

## 2013-05-12 DIAGNOSIS — I8310 Varicose veins of unspecified lower extremity with inflammation: Secondary | ICD-10-CM | POA: Diagnosis not present

## 2013-05-12 DIAGNOSIS — IMO0001 Reserved for inherently not codable concepts without codable children: Secondary | ICD-10-CM | POA: Diagnosis not present

## 2013-05-12 DIAGNOSIS — K219 Gastro-esophageal reflux disease without esophagitis: Secondary | ICD-10-CM | POA: Diagnosis not present

## 2013-05-12 DIAGNOSIS — E1142 Type 2 diabetes mellitus with diabetic polyneuropathy: Secondary | ICD-10-CM | POA: Diagnosis not present

## 2013-05-12 DIAGNOSIS — R609 Edema, unspecified: Secondary | ICD-10-CM | POA: Diagnosis not present

## 2013-05-27 DIAGNOSIS — G4700 Insomnia, unspecified: Secondary | ICD-10-CM | POA: Diagnosis not present

## 2013-05-27 DIAGNOSIS — E1149 Type 2 diabetes mellitus with other diabetic neurological complication: Secondary | ICD-10-CM | POA: Diagnosis not present

## 2013-05-27 DIAGNOSIS — J449 Chronic obstructive pulmonary disease, unspecified: Secondary | ICD-10-CM | POA: Diagnosis not present

## 2013-06-04 DIAGNOSIS — R059 Cough, unspecified: Secondary | ICD-10-CM | POA: Diagnosis not present

## 2013-06-04 DIAGNOSIS — J440 Chronic obstructive pulmonary disease with acute lower respiratory infection: Secondary | ICD-10-CM | POA: Diagnosis not present

## 2013-06-04 DIAGNOSIS — R05 Cough: Secondary | ICD-10-CM | POA: Diagnosis not present

## 2013-06-06 DIAGNOSIS — R0902 Hypoxemia: Secondary | ICD-10-CM | POA: Diagnosis not present

## 2013-06-06 DIAGNOSIS — J449 Chronic obstructive pulmonary disease, unspecified: Secondary | ICD-10-CM | POA: Diagnosis not present

## 2013-06-12 DIAGNOSIS — E1142 Type 2 diabetes mellitus with diabetic polyneuropathy: Secondary | ICD-10-CM | POA: Diagnosis not present

## 2013-06-12 DIAGNOSIS — R059 Cough, unspecified: Secondary | ICD-10-CM | POA: Diagnosis not present

## 2013-06-12 DIAGNOSIS — IMO0001 Reserved for inherently not codable concepts without codable children: Secondary | ICD-10-CM | POA: Diagnosis not present

## 2013-06-12 DIAGNOSIS — I8310 Varicose veins of unspecified lower extremity with inflammation: Secondary | ICD-10-CM | POA: Diagnosis not present

## 2013-06-12 DIAGNOSIS — R5381 Other malaise: Secondary | ICD-10-CM | POA: Diagnosis not present

## 2013-06-12 DIAGNOSIS — R05 Cough: Secondary | ICD-10-CM | POA: Diagnosis not present

## 2013-06-12 DIAGNOSIS — J209 Acute bronchitis, unspecified: Secondary | ICD-10-CM | POA: Diagnosis not present

## 2013-06-12 DIAGNOSIS — I10 Essential (primary) hypertension: Secondary | ICD-10-CM | POA: Diagnosis not present

## 2013-06-17 DIAGNOSIS — I25810 Atherosclerosis of coronary artery bypass graft(s) without angina pectoris: Secondary | ICD-10-CM | POA: Diagnosis not present

## 2013-06-17 DIAGNOSIS — I8310 Varicose veins of unspecified lower extremity with inflammation: Secondary | ICD-10-CM | POA: Diagnosis not present

## 2013-06-17 DIAGNOSIS — J440 Chronic obstructive pulmonary disease with acute lower respiratory infection: Secondary | ICD-10-CM | POA: Diagnosis not present

## 2013-06-17 DIAGNOSIS — J449 Chronic obstructive pulmonary disease, unspecified: Secondary | ICD-10-CM | POA: Diagnosis not present

## 2013-06-17 DIAGNOSIS — E1142 Type 2 diabetes mellitus with diabetic polyneuropathy: Secondary | ICD-10-CM | POA: Diagnosis not present

## 2013-06-17 DIAGNOSIS — E119 Type 2 diabetes mellitus without complications: Secondary | ICD-10-CM | POA: Diagnosis not present

## 2013-06-17 DIAGNOSIS — G4700 Insomnia, unspecified: Secondary | ICD-10-CM | POA: Diagnosis not present

## 2013-06-17 DIAGNOSIS — I10 Essential (primary) hypertension: Secondary | ICD-10-CM | POA: Diagnosis not present

## 2013-06-19 ENCOUNTER — Ambulatory Visit: Payer: Self-pay

## 2013-06-19 DIAGNOSIS — E785 Hyperlipidemia, unspecified: Secondary | ICD-10-CM | POA: Diagnosis not present

## 2013-06-19 DIAGNOSIS — I110 Hypertensive heart disease with heart failure: Secondary | ICD-10-CM | POA: Diagnosis not present

## 2013-06-19 DIAGNOSIS — E119 Type 2 diabetes mellitus without complications: Secondary | ICD-10-CM | POA: Diagnosis not present

## 2013-06-19 DIAGNOSIS — G4730 Sleep apnea, unspecified: Secondary | ICD-10-CM | POA: Diagnosis not present

## 2013-06-19 DIAGNOSIS — I509 Heart failure, unspecified: Secondary | ICD-10-CM | POA: Diagnosis not present

## 2013-06-19 DIAGNOSIS — Z713 Dietary counseling and surveillance: Secondary | ICD-10-CM | POA: Diagnosis not present

## 2013-06-29 ENCOUNTER — Emergency Department: Payer: Self-pay | Admitting: Emergency Medicine

## 2013-06-29 DIAGNOSIS — Z951 Presence of aortocoronary bypass graft: Secondary | ICD-10-CM | POA: Diagnosis not present

## 2013-06-29 DIAGNOSIS — R609 Edema, unspecified: Secondary | ICD-10-CM | POA: Diagnosis not present

## 2013-06-29 DIAGNOSIS — Z9089 Acquired absence of other organs: Secondary | ICD-10-CM | POA: Diagnosis not present

## 2013-06-29 DIAGNOSIS — E785 Hyperlipidemia, unspecified: Secondary | ICD-10-CM | POA: Diagnosis not present

## 2013-06-29 DIAGNOSIS — R42 Dizziness and giddiness: Secondary | ICD-10-CM | POA: Diagnosis not present

## 2013-06-29 LAB — COMPREHENSIVE METABOLIC PANEL
ALBUMIN: 3.4 g/dL (ref 3.4–5.0)
ALK PHOS: 85 U/L
AST: 48 U/L — AB (ref 15–37)
Anion Gap: 4 — ABNORMAL LOW (ref 7–16)
BUN: 11 mg/dL (ref 7–18)
Bilirubin,Total: 0.4 mg/dL (ref 0.2–1.0)
Calcium, Total: 8.6 mg/dL (ref 8.5–10.1)
Chloride: 106 mmol/L (ref 98–107)
Co2: 26 mmol/L (ref 21–32)
Creatinine: 1.11 mg/dL (ref 0.60–1.30)
EGFR (Non-African Amer.): >60
GLUCOSE: 131 mg/dL — AB (ref 65–99)
Osmolality: 273 (ref 275–301)
Potassium: 4.6 mmol/L (ref 3.5–5.1)
SGPT (ALT): 25 U/L (ref 12–78)
SODIUM: 136 mmol/L (ref 136–145)
TOTAL PROTEIN: 7.3 g/dL (ref 6.4–8.2)

## 2013-06-29 LAB — URINALYSIS, COMPLETE
Bacteria: NONE SEEN
Bilirubin,UR: NEGATIVE
Blood: NEGATIVE
Glucose,UR: NEGATIVE mg/dL (ref 0–75)
KETONE: NEGATIVE
Leukocyte Esterase: NEGATIVE
NITRITE: NEGATIVE
PH: 6 (ref 4.5–8.0)
Protein: NEGATIVE
RBC,UR: NONE SEEN /HPF (ref 0–5)
SPECIFIC GRAVITY: 1.01 (ref 1.003–1.030)
Squamous Epithelial: NONE SEEN
WBC UR: <1 /HPF (ref 0–5)

## 2013-06-29 LAB — CBC
HCT: 39.9 % — ABNORMAL LOW (ref 40.0–52.0)
HGB: 13.3 g/dL (ref 13.0–18.0)
MCH: 30.7 pg (ref 26.0–34.0)
MCHC: 33.4 g/dL (ref 32.0–36.0)
MCV: 92 fL (ref 80–100)
PLATELETS: 150 10*3/uL (ref 150–440)
RBC: 4.34 10*6/uL — ABNORMAL LOW (ref 4.40–5.90)
RDW: 15.6 % — AB (ref 11.5–14.5)
WBC: 8.9 10*3/uL (ref 3.8–10.6)

## 2013-06-29 LAB — TROPONIN I: Troponin-I: <0.02 ng/mL

## 2013-06-30 DIAGNOSIS — J449 Chronic obstructive pulmonary disease, unspecified: Secondary | ICD-10-CM | POA: Diagnosis not present

## 2013-06-30 DIAGNOSIS — G4730 Sleep apnea, unspecified: Secondary | ICD-10-CM | POA: Diagnosis not present

## 2013-06-30 DIAGNOSIS — E1142 Type 2 diabetes mellitus with diabetic polyneuropathy: Secondary | ICD-10-CM | POA: Diagnosis not present

## 2013-06-30 DIAGNOSIS — G4710 Hypersomnia, unspecified: Secondary | ICD-10-CM | POA: Diagnosis not present

## 2013-06-30 DIAGNOSIS — M79609 Pain in unspecified limb: Secondary | ICD-10-CM | POA: Diagnosis not present

## 2013-06-30 DIAGNOSIS — E119 Type 2 diabetes mellitus without complications: Secondary | ICD-10-CM | POA: Diagnosis not present

## 2013-06-30 DIAGNOSIS — I10 Essential (primary) hypertension: Secondary | ICD-10-CM | POA: Diagnosis not present

## 2013-07-06 ENCOUNTER — Ambulatory Visit: Payer: Self-pay

## 2013-07-06 DIAGNOSIS — E119 Type 2 diabetes mellitus without complications: Secondary | ICD-10-CM | POA: Diagnosis not present

## 2013-07-06 DIAGNOSIS — R197 Diarrhea, unspecified: Secondary | ICD-10-CM | POA: Diagnosis not present

## 2013-07-06 DIAGNOSIS — Z79899 Other long term (current) drug therapy: Secondary | ICD-10-CM | POA: Diagnosis not present

## 2013-07-06 DIAGNOSIS — I10 Essential (primary) hypertension: Secondary | ICD-10-CM | POA: Diagnosis not present

## 2013-07-06 DIAGNOSIS — Z5181 Encounter for therapeutic drug level monitoring: Secondary | ICD-10-CM | POA: Diagnosis not present

## 2013-07-06 DIAGNOSIS — R110 Nausea: Secondary | ICD-10-CM | POA: Diagnosis not present

## 2013-07-06 DIAGNOSIS — R109 Unspecified abdominal pain: Secondary | ICD-10-CM | POA: Diagnosis not present

## 2013-07-06 DIAGNOSIS — Z794 Long term (current) use of insulin: Secondary | ICD-10-CM | POA: Diagnosis not present

## 2013-08-07 DIAGNOSIS — IMO0001 Reserved for inherently not codable concepts without codable children: Secondary | ICD-10-CM | POA: Diagnosis not present

## 2013-08-07 DIAGNOSIS — R5383 Other fatigue: Secondary | ICD-10-CM | POA: Diagnosis not present

## 2013-08-07 DIAGNOSIS — K219 Gastro-esophageal reflux disease without esophagitis: Secondary | ICD-10-CM | POA: Diagnosis not present

## 2013-08-07 DIAGNOSIS — I8310 Varicose veins of unspecified lower extremity with inflammation: Secondary | ICD-10-CM | POA: Diagnosis not present

## 2013-08-07 DIAGNOSIS — I10 Essential (primary) hypertension: Secondary | ICD-10-CM | POA: Diagnosis not present

## 2013-08-07 DIAGNOSIS — R5381 Other malaise: Secondary | ICD-10-CM | POA: Diagnosis not present

## 2013-08-07 DIAGNOSIS — E1142 Type 2 diabetes mellitus with diabetic polyneuropathy: Secondary | ICD-10-CM | POA: Diagnosis not present

## 2013-08-07 DIAGNOSIS — J449 Chronic obstructive pulmonary disease, unspecified: Secondary | ICD-10-CM | POA: Diagnosis not present

## 2013-08-21 DIAGNOSIS — IMO0001 Reserved for inherently not codable concepts without codable children: Secondary | ICD-10-CM | POA: Diagnosis not present

## 2013-08-21 DIAGNOSIS — E1142 Type 2 diabetes mellitus with diabetic polyneuropathy: Secondary | ICD-10-CM | POA: Diagnosis not present

## 2013-08-21 DIAGNOSIS — J449 Chronic obstructive pulmonary disease, unspecified: Secondary | ICD-10-CM | POA: Diagnosis not present

## 2013-08-21 DIAGNOSIS — I25810 Atherosclerosis of coronary artery bypass graft(s) without angina pectoris: Secondary | ICD-10-CM | POA: Diagnosis not present

## 2013-08-21 DIAGNOSIS — G4700 Insomnia, unspecified: Secondary | ICD-10-CM | POA: Diagnosis not present

## 2013-08-21 DIAGNOSIS — I10 Essential (primary) hypertension: Secondary | ICD-10-CM | POA: Diagnosis not present

## 2013-08-21 DIAGNOSIS — I739 Peripheral vascular disease, unspecified: Secondary | ICD-10-CM | POA: Diagnosis not present

## 2013-08-26 ENCOUNTER — Other Ambulatory Visit: Payer: Self-pay | Admitting: *Deleted

## 2013-08-26 DIAGNOSIS — I739 Peripheral vascular disease, unspecified: Secondary | ICD-10-CM

## 2013-09-04 DIAGNOSIS — R5381 Other malaise: Secondary | ICD-10-CM | POA: Diagnosis not present

## 2013-09-04 DIAGNOSIS — I25810 Atherosclerosis of coronary artery bypass graft(s) without angina pectoris: Secondary | ICD-10-CM | POA: Diagnosis not present

## 2013-09-04 DIAGNOSIS — I739 Peripheral vascular disease, unspecified: Secondary | ICD-10-CM | POA: Diagnosis not present

## 2013-09-04 DIAGNOSIS — R5383 Other fatigue: Secondary | ICD-10-CM | POA: Diagnosis not present

## 2013-09-04 DIAGNOSIS — I10 Essential (primary) hypertension: Secondary | ICD-10-CM | POA: Diagnosis not present

## 2013-09-04 DIAGNOSIS — IMO0001 Reserved for inherently not codable concepts without codable children: Secondary | ICD-10-CM | POA: Diagnosis not present

## 2013-09-04 DIAGNOSIS — I8310 Varicose veins of unspecified lower extremity with inflammation: Secondary | ICD-10-CM | POA: Diagnosis not present

## 2013-09-04 DIAGNOSIS — G4710 Hypersomnia, unspecified: Secondary | ICD-10-CM | POA: Diagnosis not present

## 2013-09-04 DIAGNOSIS — G4730 Sleep apnea, unspecified: Secondary | ICD-10-CM | POA: Diagnosis not present

## 2013-09-04 DIAGNOSIS — J449 Chronic obstructive pulmonary disease, unspecified: Secondary | ICD-10-CM | POA: Diagnosis not present

## 2013-09-15 ENCOUNTER — Encounter: Payer: Self-pay | Admitting: Surgery

## 2013-09-18 DIAGNOSIS — R5381 Other malaise: Secondary | ICD-10-CM | POA: Diagnosis not present

## 2013-09-18 DIAGNOSIS — R5383 Other fatigue: Secondary | ICD-10-CM | POA: Diagnosis not present

## 2013-09-18 DIAGNOSIS — E1142 Type 2 diabetes mellitus with diabetic polyneuropathy: Secondary | ICD-10-CM | POA: Diagnosis not present

## 2013-09-18 DIAGNOSIS — I739 Peripheral vascular disease, unspecified: Secondary | ICD-10-CM | POA: Diagnosis not present

## 2013-09-18 DIAGNOSIS — J449 Chronic obstructive pulmonary disease, unspecified: Secondary | ICD-10-CM | POA: Diagnosis not present

## 2013-09-18 DIAGNOSIS — I890 Lymphedema, not elsewhere classified: Secondary | ICD-10-CM | POA: Diagnosis not present

## 2013-09-18 DIAGNOSIS — I10 Essential (primary) hypertension: Secondary | ICD-10-CM | POA: Diagnosis not present

## 2013-09-18 DIAGNOSIS — E119 Type 2 diabetes mellitus without complications: Secondary | ICD-10-CM | POA: Diagnosis not present

## 2013-09-18 DIAGNOSIS — I25810 Atherosclerosis of coronary artery bypass graft(s) without angina pectoris: Secondary | ICD-10-CM | POA: Diagnosis not present

## 2013-09-26 ENCOUNTER — Encounter: Payer: Self-pay | Admitting: Surgery

## 2013-09-29 ENCOUNTER — Encounter: Payer: Self-pay | Admitting: Surgery

## 2013-09-29 ENCOUNTER — Ambulatory Visit (INDEPENDENT_AMBULATORY_CARE_PROVIDER_SITE_OTHER): Payer: Medicare Other | Admitting: Surgery

## 2013-09-29 ENCOUNTER — Ambulatory Visit (HOSPITAL_COMMUNITY)
Admission: RE | Admit: 2013-09-29 | Discharge: 2013-09-29 | Disposition: A | Discharge status: Home / Self Care | Payer: Medicare Other | Source: Ambulatory Visit | Attending: Surgery | Admitting: Surgery

## 2013-09-29 VITALS — BP 149/77 | HR 72 | Ht 66.0 in | Wt 349.0 lb

## 2013-09-29 DIAGNOSIS — I739 Peripheral vascular disease, unspecified: Secondary | ICD-10-CM | POA: Diagnosis not present

## 2013-09-29 NOTE — Addendum Note (Signed)
Addended by: Mena Goes on: 09/29/2013 11:35 AM   Modules accepted: Orders

## 2013-09-29 NOTE — Progress Notes (Signed)
Patient name: Adam Merritt MRN: [id number] DOB: 05/23/1946 Sex: male   Referred by: Dr. Chancy Milroy  Reason for referral:  Chief Complaint  Patient presents with  . New Evaluation    abnormal arterial -outside exam    HISTORY OF PRESENT ILLNESS: This is a 68 year old male referred for abnormal ABI's.  The patient reports significant pain in his legs and feet.  He states that his feet hurt him more than his legs.  He does understand how his feet can be numb but yet cause such significant pain.  He has had some relief from Neurontin.  He will also take Percocet for pain.  The patient also suffers from significant bilateral leg swelling.  He tells me he has undergone an extensive workup.  He has tried to wear compression.  He and attempts to keep his legs elevated, however both of these 2 strategies caused significant pain, and therefore he cannot do them.  The patient is a diabetic.  He reports his blood sugars ranged from 150-200.  He suffers from hypercholesterolemia which is managed with a statin.  He is medically managed for hypertension.  He is a nonsmoker.  He denies nonhealing ulcers on his legs.  Past Medical History  Diagnosis Date  . Obstructive chronic bronchitis without exacerbation   . Insomnia   . Chest pain   . Lumbago   . Herpes zoster without mention of complication   . Neuropathy in diabetes   . Hypertension   . Hyperlipidemia   . Diabetes mellitus without complication   . Coronary artery disease   . Varicose veins   . Esophageal reflux   . Other malaise and fatigue   . Anemia   . CAD (coronary artery disease)   . Stroke     Past Surgical History  Procedure Laterality Date  . Coronary artery bypass graft    . Cholecystectomy      History   Social History  . Marital Status: Married    Spouse Name: N/A    Number of Children: N/A  . Years of Education: N/A   Occupational History  . Not on file.   Social History Main Topics  . Smoking status: Never  Smoker   . Smokeless tobacco: Never Used  . Alcohol Use: No  . Drug Use: No  . Sexual Activity: Not on file   Other Topics Concern  . Not on file   Social History Narrative  . No narrative on file    Family History  Problem Relation Age of Onset  . Diabetes Mother   . Hyperlipidemia Mother   . Hypertension Mother   . Diabetes Father   . Hyperlipidemia Father   . Hypertension Father     Allergies as of 09/29/2013  . (No Known Allergies)    Current Outpatient Prescriptions on File Prior to Visit  Medication Sig Dispense Refill  . Albuterol Sulfate (PROAIR HFA IN) Inhale 90 mcg into the lungs.      Marland Kitchen aspirin 325 MG tablet Take 325 mg by mouth daily.      Marland Kitchen atorvastatin (LIPITOR) 40 MG tablet Take 40 mg by mouth daily.      . bisacodyl (BISACODYL) 5 MG EC tablet Take 5 mg by mouth daily as needed for moderate constipation.      . carvedilol (COREG) 25 MG tablet Take 25 mg by mouth 2 (two) times daily with a meal.      . clobetasol ointment (TEMOVATE)  [id number] % Apply 1 application topically 2 (two) times daily. Bilateral lower leg      . docusate sodium (COLACE) 100 MG capsule Take 100 mg by mouth 2 (two) times daily.      Marland Kitchen Fe Cbn-Fe Gluc-FA-B12-C-DSS (FERRALET 90 PO) Take by mouth. Dual-iron  Tab 90-1 -12-50 mg  One Tab daily      . Fluticasone-Salmeterol (ADVAIR DISKUS) 250-50 MCG/DOSE AEPB Inhale 1 puff into the lungs 2 (two) times daily.      . furosemide (LASIX) 20 MG tablet Take 20 mg by mouth.      . gabapentin (NEURONTIN) 100 MG capsule Take 100 mg by mouth 3 (three) times daily.      Marland Kitchen glucose blood test strip 1 each by Other route as needed for other. Use as instructed      . hydrALAZINE (APRESOLINE) 25 MG tablet Take 25 mg by mouth 3 (three) times daily.      Marland Kitchen ibuprofen (ADVIL,MOTRIN) 600 MG tablet Take 600 mg by mouth every 6 (six) hours as needed.      . Insulin Detemir (LEVEMIR FLEXPEN [place]) Inject 100 Units into the skin.      Marland Kitchen ipratropium-albuterol (DUONEB)  0.5-2.5 (3) MG/3ML SOLN Take 3 mLs by nebulization.      . Liraglutide (VICTOZA) 18 MG/3ML SOPN Inject into the skin.      . montelukast (SINGULAIR) 10 MG tablet Take 10 mg by mouth at bedtime.      . Multiple Vitamin (MULTIVITAMIN) tablet Take 1 tablet by mouth daily.      Marland Kitchen OMEPRAZOLE PO Take 20 mg by mouth daily. Dr      . oxyCODONE-acetaminophen (PERCOCET/ROXICET) 5-325 MG per tablet Take by mouth every 4 (four) hours as needed for severe pain.      Marland Kitchen zolpidem (AMBIEN CR) 12.5 MG CR tablet Take 12.5 mg by mouth at bedtime as needed for sleep.       No current facility-administered medications on file prior to visit.     REVIEW OF SYSTEMS: Cardiovascular: No chest pain, chest pressure, palpitations, positive for pain in legs from walking and lying flat.  Positive for Pulmonary: No productive cough, asthma or wheezing. Neurologic: No weakness, paresthesias, aphasia, or amaurosis. No dizziness. Hematologic: Positive for bleeding problems. Musculoskeletal: No joint pain or joint swelling. Gastrointestinal: No blood in stool or hematemesis Genitourinary: No dysuria or hematuria. Psychiatric:: No history of major depression. Integumentary: No rashes or ulcers. Constitutional: No fever or chills.  PHYSICAL EXAMINATION: General: The patient appears their stated age.  Vital signs are BP 149/77  Pulse 72  Ht 5\' 6"  (1.676 m)  Wt 349 lb (158.305 kg)  BMI 56.36 kg/m2  SpO2 96% HEENT:  No gross abnormalities Pulmonary: Respirations are non-labored Abdomen: Soft and non-tender  Musculoskeletal: There are no major deformities.   Neurologic: No focal weakness or paresthesias are detected, Skin: No open wounds, however he does have significant skin changes bilaterally, left greater than right, from the knee to the ankle Psychiatric: The patient has normal affect. Cardiovascular: There is a regular rate and rhythm without significant murmur appreciated.  I cannot palpate pedal pulses.  He has  nonpitting edema from the forefoot up to the knee.  He has skin discoloration on both lower extremities, left greater than right.  No carotid bruits  Diagnostic Studies: I have reviewed his outside ultrasound which shows an ankle-brachial index of 0.72 on the left and 1.0 on the right.  This study was repeated at  our office.  The ankle-brachial index on the right was 1.26 with triphasic waveforms.  On the left it was a 1.04 with biphasic waveforms.  The patient had a toe pressure of 121 on the right and 100 on the left   Assessment:  Diabetic lower extremity neuropathy Plan: Despite discrepancies with ultrasound findings, I do not feel that the patient's symptoms are related to arterial insufficiency.  He does not endorse symptoms of claudication.  I think it is rest pain is secondary to neuropathy.  I discussed the importance of treating his lymphedema with elevation and compression, however the patient states that he cannot tolerate either of these treatment strategies.  We discussed the importance of daily foot examination given his neuropathy.  In summary, I would not recommend any further imaging , as I do not think his symptoms are related to arterial insufficiency, but rather secondary to diabetic neuropathy.  The patient still has room for increasing his Neurontin dose, which he states does help him.  I have scheduled him to come back in one year for a repeat arterial evaluation.     Eldridge Abrahams, M.D. [facility] Office: [phone] Pager:  [phone]

## 2013-10-10 ENCOUNTER — Emergency Department: Payer: Self-pay | Admitting: Emergency Medicine

## 2013-10-10 DIAGNOSIS — Z951 Presence of aortocoronary bypass graft: Secondary | ICD-10-CM | POA: Diagnosis not present

## 2013-10-10 DIAGNOSIS — Z9089 Acquired absence of other organs: Secondary | ICD-10-CM | POA: Diagnosis not present

## 2013-10-10 DIAGNOSIS — M79609 Pain in unspecified limb: Secondary | ICD-10-CM | POA: Diagnosis not present

## 2013-10-10 DIAGNOSIS — L02419 Cutaneous abscess of limb, unspecified: Secondary | ICD-10-CM | POA: Diagnosis not present

## 2013-10-10 DIAGNOSIS — I10 Essential (primary) hypertension: Secondary | ICD-10-CM | POA: Diagnosis not present

## 2013-10-10 DIAGNOSIS — M7989 Other specified soft tissue disorders: Secondary | ICD-10-CM | POA: Diagnosis not present

## 2013-10-10 DIAGNOSIS — E119 Type 2 diabetes mellitus without complications: Secondary | ICD-10-CM | POA: Diagnosis not present

## 2013-10-10 DIAGNOSIS — E785 Hyperlipidemia, unspecified: Secondary | ICD-10-CM | POA: Diagnosis not present

## 2013-10-10 DIAGNOSIS — R609 Edema, unspecified: Secondary | ICD-10-CM | POA: Diagnosis not present

## 2013-10-10 LAB — CBC WITH DIFFERENTIAL/PLATELET
BASOS ABS: 0.1 10*3/uL (ref 0.0–0.1)
Basophil %: 0.7 %
EOS PCT: 3.4 %
Eosinophil #: 0.3 10*3/uL (ref 0.0–0.7)
HCT: 38.4 % — AB (ref 40.0–52.0)
HGB: 12.7 g/dL — ABNORMAL LOW (ref 13.0–18.0)
LYMPHS ABS: 1.3 10*3/uL (ref 1.0–3.6)
LYMPHS PCT: 14.5 %
MCH: 30.6 pg (ref 26.0–34.0)
MCHC: 33.1 g/dL (ref 32.0–36.0)
MCV: 93 fL (ref 80–100)
MONO ABS: 0.7 x10 3/mm (ref 0.2–1.0)
Monocyte %: 8.4 %
Neutrophil #: 6.5 10*3/uL (ref 1.4–6.5)
Neutrophil %: 73 %
Platelet: 153 10*3/uL (ref 150–440)
RBC: 4.15 10*6/uL — ABNORMAL LOW (ref 4.40–5.90)
RDW: 14.2 % (ref 11.5–14.5)
WBC: 8.9 10*3/uL (ref 3.8–10.6)

## 2013-10-10 LAB — BASIC METABOLIC PANEL
Anion Gap: 4 — ABNORMAL LOW (ref 7–16)
BUN: 14 mg/dL (ref 7–18)
CALCIUM: 8.2 mg/dL — AB (ref 8.5–10.1)
CO2: 28 mmol/L (ref 21–32)
CREATININE: 1.57 mg/dL — AB (ref 0.60–1.30)
Chloride: 108 mmol/L — ABNORMAL HIGH (ref 98–107)
EGFR (African American): 51 — ABNORMAL LOW
EGFR (Non-African Amer.): 44 — ABNORMAL LOW
Glucose: 170 mg/dL — ABNORMAL HIGH (ref 65–99)
OSMOLALITY: 284 (ref 275–301)
Potassium: 3.9 mmol/L (ref 3.5–5.1)
Sodium: 140 mmol/L (ref 136–145)

## 2013-10-11 DIAGNOSIS — M7989 Other specified soft tissue disorders: Secondary | ICD-10-CM | POA: Diagnosis not present

## 2013-10-11 DIAGNOSIS — M79609 Pain in unspecified limb: Secondary | ICD-10-CM | POA: Diagnosis not present

## 2013-10-14 DIAGNOSIS — R609 Edema, unspecified: Secondary | ICD-10-CM | POA: Diagnosis not present

## 2013-10-14 DIAGNOSIS — R5381 Other malaise: Secondary | ICD-10-CM | POA: Diagnosis not present

## 2013-10-14 DIAGNOSIS — R5383 Other fatigue: Secondary | ICD-10-CM | POA: Diagnosis not present

## 2013-10-14 DIAGNOSIS — L03119 Cellulitis of unspecified part of limb: Secondary | ICD-10-CM | POA: Diagnosis not present

## 2013-10-14 DIAGNOSIS — E119 Type 2 diabetes mellitus without complications: Secondary | ICD-10-CM | POA: Diagnosis not present

## 2013-10-14 DIAGNOSIS — I25810 Atherosclerosis of coronary artery bypass graft(s) without angina pectoris: Secondary | ICD-10-CM | POA: Diagnosis not present

## 2013-10-14 DIAGNOSIS — B353 Tinea pedis: Secondary | ICD-10-CM | POA: Diagnosis not present

## 2013-10-14 DIAGNOSIS — L02419 Cutaneous abscess of limb, unspecified: Secondary | ICD-10-CM | POA: Diagnosis not present

## 2013-10-14 DIAGNOSIS — I10 Essential (primary) hypertension: Secondary | ICD-10-CM | POA: Diagnosis not present

## 2013-10-14 DIAGNOSIS — I739 Peripheral vascular disease, unspecified: Secondary | ICD-10-CM | POA: Diagnosis not present

## 2013-10-20 DIAGNOSIS — R609 Edema, unspecified: Secondary | ICD-10-CM | POA: Diagnosis not present

## 2013-10-20 DIAGNOSIS — L03119 Cellulitis of unspecified part of limb: Secondary | ICD-10-CM | POA: Diagnosis not present

## 2013-10-20 DIAGNOSIS — E1142 Type 2 diabetes mellitus with diabetic polyneuropathy: Secondary | ICD-10-CM | POA: Diagnosis not present

## 2013-10-20 DIAGNOSIS — E119 Type 2 diabetes mellitus without complications: Secondary | ICD-10-CM | POA: Diagnosis not present

## 2013-10-20 DIAGNOSIS — L02419 Cutaneous abscess of limb, unspecified: Secondary | ICD-10-CM | POA: Diagnosis not present

## 2013-10-20 DIAGNOSIS — I10 Essential (primary) hypertension: Secondary | ICD-10-CM | POA: Diagnosis not present

## 2013-10-20 DIAGNOSIS — R5381 Other malaise: Secondary | ICD-10-CM | POA: Diagnosis not present

## 2013-10-29 DIAGNOSIS — I219 Acute myocardial infarction, unspecified: Secondary | ICD-10-CM | POA: Diagnosis not present

## 2013-10-29 DIAGNOSIS — R0602 Shortness of breath: Secondary | ICD-10-CM | POA: Diagnosis not present

## 2013-10-29 DIAGNOSIS — E782 Mixed hyperlipidemia: Secondary | ICD-10-CM | POA: Insufficient documentation

## 2013-10-29 DIAGNOSIS — I10 Essential (primary) hypertension: Secondary | ICD-10-CM | POA: Diagnosis not present

## 2013-10-29 DIAGNOSIS — I6350 Cerebral infarction due to unspecified occlusion or stenosis of unspecified cerebral artery: Secondary | ICD-10-CM | POA: Diagnosis not present

## 2013-11-10 DIAGNOSIS — H25049 Posterior subcapsular polar age-related cataract, unspecified eye: Secondary | ICD-10-CM | POA: Diagnosis not present

## 2013-12-02 DIAGNOSIS — I739 Peripheral vascular disease, unspecified: Secondary | ICD-10-CM | POA: Diagnosis not present

## 2013-12-02 DIAGNOSIS — E119 Type 2 diabetes mellitus without complications: Secondary | ICD-10-CM | POA: Diagnosis not present

## 2013-12-02 DIAGNOSIS — J449 Chronic obstructive pulmonary disease, unspecified: Secondary | ICD-10-CM | POA: Diagnosis not present

## 2013-12-02 DIAGNOSIS — R5383 Other fatigue: Secondary | ICD-10-CM | POA: Diagnosis not present

## 2013-12-02 DIAGNOSIS — B353 Tinea pedis: Secondary | ICD-10-CM | POA: Diagnosis not present

## 2013-12-02 DIAGNOSIS — R5381 Other malaise: Secondary | ICD-10-CM | POA: Diagnosis not present

## 2013-12-02 DIAGNOSIS — E1142 Type 2 diabetes mellitus with diabetic polyneuropathy: Secondary | ICD-10-CM | POA: Diagnosis not present

## 2013-12-02 DIAGNOSIS — D619 Aplastic anemia, unspecified: Secondary | ICD-10-CM | POA: Diagnosis not present

## 2013-12-15 ENCOUNTER — Other Ambulatory Visit: Payer: Self-pay

## 2013-12-15 DIAGNOSIS — R5381 Other malaise: Secondary | ICD-10-CM | POA: Diagnosis not present

## 2013-12-15 DIAGNOSIS — D649 Anemia, unspecified: Secondary | ICD-10-CM | POA: Diagnosis not present

## 2013-12-15 DIAGNOSIS — R0602 Shortness of breath: Secondary | ICD-10-CM | POA: Diagnosis not present

## 2013-12-15 DIAGNOSIS — R5383 Other fatigue: Secondary | ICD-10-CM | POA: Diagnosis not present

## 2013-12-15 LAB — COMPREHENSIVE METABOLIC PANEL
ALBUMIN: 3.2 g/dL — AB (ref 3.4–5.0)
ANION GAP: 7 (ref 7–16)
Alkaline Phosphatase: 88 U/L
BILIRUBIN TOTAL: 0.4 mg/dL (ref 0.2–1.0)
BUN: 11 mg/dL (ref 7–18)
CALCIUM: 8.3 mg/dL — AB (ref 8.5–10.1)
CHLORIDE: 106 mmol/L (ref 98–107)
Co2: 26 mmol/L (ref 21–32)
Creatinine: 1.52 mg/dL — ABNORMAL HIGH (ref 0.60–1.30)
EGFR (Non-African Amer.): 46 — ABNORMAL LOW
GFR CALC AF AMER: 53 — AB
GLUCOSE: 129 mg/dL — AB (ref 65–99)
Osmolality: 279 (ref 275–301)
Potassium: 3.9 mmol/L (ref 3.5–5.1)
SGOT(AST): 26 U/L (ref 15–37)
SGPT (ALT): 24 U/L (ref 12–78)
Sodium: 139 mmol/L (ref 136–145)
Total Protein: 7.2 g/dL (ref 6.4–8.2)

## 2013-12-15 LAB — CBC WITH DIFFERENTIAL/PLATELET
BASOS PCT: 0.8 %
Basophil #: 0.1 10*3/uL (ref 0.0–0.1)
EOS PCT: 4.4 %
Eosinophil #: 0.4 10*3/uL (ref 0.0–0.7)
HCT: 39.8 % — ABNORMAL LOW (ref 40.0–52.0)
HGB: 13.3 g/dL (ref 13.0–18.0)
Lymphocyte #: 1.2 10*3/uL (ref 1.0–3.6)
Lymphocyte %: 13.2 %
MCH: 31 pg (ref 26.0–34.0)
MCHC: 33.5 g/dL (ref 32.0–36.0)
MCV: 93 fL (ref 80–100)
MONOS PCT: 6 %
Monocyte #: 0.6 x10 3/mm (ref 0.2–1.0)
NEUTROS PCT: 75.6 %
Neutrophil #: 7.1 10*3/uL — ABNORMAL HIGH (ref 1.4–6.5)
Platelet: 164 10*3/uL (ref 150–440)
RBC: 4.3 10*6/uL — AB (ref 4.40–5.90)
RDW: 14 % (ref 11.5–14.5)
WBC: 9.4 10*3/uL (ref 3.8–10.6)

## 2013-12-15 LAB — TSH: THYROID STIMULATING HORM: 1.76 u[IU]/mL

## 2013-12-15 LAB — FERRITIN: Ferritin (ARMC): 31 ng/mL (ref 8–388)

## 2013-12-15 LAB — FOLATE: Folic Acid: 33.4 ng/mL (ref 3.1–100.0)

## 2013-12-30 ENCOUNTER — Ambulatory Visit: Payer: Self-pay | Admitting: Ophthalmology

## 2013-12-30 DIAGNOSIS — Z01810 Encounter for preprocedural cardiovascular examination: Secondary | ICD-10-CM | POA: Diagnosis not present

## 2013-12-30 DIAGNOSIS — I10 Essential (primary) hypertension: Secondary | ICD-10-CM | POA: Diagnosis not present

## 2013-12-30 DIAGNOSIS — Z01812 Encounter for preprocedural laboratory examination: Secondary | ICD-10-CM | POA: Diagnosis not present

## 2013-12-30 DIAGNOSIS — H2510 Age-related nuclear cataract, unspecified eye: Secondary | ICD-10-CM | POA: Diagnosis not present

## 2013-12-30 LAB — POTASSIUM: Potassium: 4.4 mmol/L (ref 3.5–5.1)

## 2014-01-05 ENCOUNTER — Observation Stay: Payer: Self-pay | Admitting: Internal Medicine

## 2014-01-05 DIAGNOSIS — K219 Gastro-esophageal reflux disease without esophagitis: Secondary | ICD-10-CM | POA: Diagnosis not present

## 2014-01-05 DIAGNOSIS — Z6841 Body Mass Index (BMI) 40.0 and over, adult: Secondary | ICD-10-CM | POA: Diagnosis not present

## 2014-01-05 DIAGNOSIS — G4733 Obstructive sleep apnea (adult) (pediatric): Secondary | ICD-10-CM | POA: Diagnosis not present

## 2014-01-05 DIAGNOSIS — E1142 Type 2 diabetes mellitus with diabetic polyneuropathy: Secondary | ICD-10-CM | POA: Diagnosis not present

## 2014-01-05 DIAGNOSIS — Z794 Long term (current) use of insulin: Secondary | ICD-10-CM | POA: Diagnosis not present

## 2014-01-05 DIAGNOSIS — I129 Hypertensive chronic kidney disease with stage 1 through stage 4 chronic kidney disease, or unspecified chronic kidney disease: Secondary | ICD-10-CM | POA: Diagnosis not present

## 2014-01-05 DIAGNOSIS — E119 Type 2 diabetes mellitus without complications: Secondary | ICD-10-CM | POA: Diagnosis not present

## 2014-01-05 DIAGNOSIS — R52 Pain, unspecified: Secondary | ICD-10-CM | POA: Diagnosis not present

## 2014-01-05 DIAGNOSIS — R0602 Shortness of breath: Secondary | ICD-10-CM | POA: Diagnosis not present

## 2014-01-05 DIAGNOSIS — R079 Chest pain, unspecified: Secondary | ICD-10-CM | POA: Diagnosis not present

## 2014-01-05 DIAGNOSIS — N183 Chronic kidney disease, stage 3 unspecified: Secondary | ICD-10-CM | POA: Diagnosis not present

## 2014-01-05 DIAGNOSIS — E1149 Type 2 diabetes mellitus with other diabetic neurological complication: Secondary | ICD-10-CM | POA: Diagnosis not present

## 2014-01-05 DIAGNOSIS — Z79899 Other long term (current) drug therapy: Secondary | ICD-10-CM | POA: Diagnosis not present

## 2014-01-05 DIAGNOSIS — E669 Obesity, unspecified: Secondary | ICD-10-CM | POA: Diagnosis not present

## 2014-01-05 DIAGNOSIS — I200 Unstable angina: Secondary | ICD-10-CM | POA: Diagnosis not present

## 2014-01-05 DIAGNOSIS — J449 Chronic obstructive pulmonary disease, unspecified: Secondary | ICD-10-CM | POA: Diagnosis not present

## 2014-01-05 DIAGNOSIS — I2510 Atherosclerotic heart disease of native coronary artery without angina pectoris: Secondary | ICD-10-CM | POA: Diagnosis not present

## 2014-01-05 DIAGNOSIS — I209 Angina pectoris, unspecified: Secondary | ICD-10-CM | POA: Diagnosis not present

## 2014-01-05 DIAGNOSIS — Z951 Presence of aortocoronary bypass graft: Secondary | ICD-10-CM | POA: Diagnosis not present

## 2014-01-05 DIAGNOSIS — R0789 Other chest pain: Secondary | ICD-10-CM | POA: Diagnosis not present

## 2014-01-05 DIAGNOSIS — Z7982 Long term (current) use of aspirin: Secondary | ICD-10-CM | POA: Diagnosis not present

## 2014-01-05 LAB — BASIC METABOLIC PANEL
ANION GAP: 7 (ref 7–16)
BUN: 14 mg/dL (ref 7–18)
CALCIUM: 8.9 mg/dL (ref 8.5–10.1)
Chloride: 102 mmol/L (ref 98–107)
Co2: 31 mmol/L (ref 21–32)
Creatinine: 1.64 mg/dL — ABNORMAL HIGH (ref 0.60–1.30)
EGFR (African American): 49 — ABNORMAL LOW
EGFR (Non-African Amer.): 42 — ABNORMAL LOW
GLUCOSE: 162 mg/dL — AB (ref 65–99)
Osmolality: 283 (ref 275–301)
POTASSIUM: 4.3 mmol/L (ref 3.5–5.1)
SODIUM: 140 mmol/L (ref 136–145)

## 2014-01-05 LAB — PROTIME-INR
INR: 1
PROTHROMBIN TIME: 12.9 s (ref 11.5–14.7)

## 2014-01-05 LAB — CBC
HCT: 39.1 % — AB (ref 40.0–52.0)
HGB: 12.7 g/dL — ABNORMAL LOW (ref 13.0–18.0)
MCH: 30.6 pg (ref 26.0–34.0)
MCHC: 32.4 g/dL (ref 32.0–36.0)
MCV: 94 fL (ref 80–100)
PLATELETS: 150 10*3/uL (ref 150–440)
RBC: 4.14 10*6/uL — ABNORMAL LOW (ref 4.40–5.90)
RDW: 14.1 % (ref 11.5–14.5)
WBC: 11.2 10*3/uL — ABNORMAL HIGH (ref 3.8–10.6)

## 2014-01-05 LAB — CK TOTAL AND CKMB (NOT AT ARMC)
CK, TOTAL: 118 U/L
CK, TOTAL: 77 U/L
CK, Total: 90 U/L
CK-MB: 1.5 ng/mL (ref 0.5–3.6)
CK-MB: 1.2 ng/mL (ref 0.5–3.6)
CK-MB: 1.2 ng/mL (ref 0.5–3.6)

## 2014-01-05 LAB — PRO B NATRIURETIC PEPTIDE: B-Type Natriuretic Peptide: 144 pg/mL — ABNORMAL HIGH (ref 0–125)

## 2014-01-05 LAB — TROPONIN I
Troponin-I: <0.02 ng/mL
Troponin-I: 0.04 ng/mL
Troponin-I: 0.05 ng/mL

## 2014-01-06 DIAGNOSIS — J449 Chronic obstructive pulmonary disease, unspecified: Secondary | ICD-10-CM | POA: Diagnosis not present

## 2014-01-06 DIAGNOSIS — E119 Type 2 diabetes mellitus without complications: Secondary | ICD-10-CM | POA: Diagnosis not present

## 2014-01-06 DIAGNOSIS — I209 Angina pectoris, unspecified: Secondary | ICD-10-CM | POA: Diagnosis not present

## 2014-01-06 DIAGNOSIS — K219 Gastro-esophageal reflux disease without esophagitis: Secondary | ICD-10-CM | POA: Diagnosis not present

## 2014-01-06 DIAGNOSIS — R0602 Shortness of breath: Secondary | ICD-10-CM | POA: Diagnosis not present

## 2014-01-06 DIAGNOSIS — I200 Unstable angina: Secondary | ICD-10-CM | POA: Diagnosis not present

## 2014-01-06 DIAGNOSIS — I2510 Atherosclerotic heart disease of native coronary artery without angina pectoris: Secondary | ICD-10-CM | POA: Diagnosis not present

## 2014-01-06 LAB — BASIC METABOLIC PANEL
Anion Gap: 9 (ref 7–16)
BUN: 11 mg/dL (ref 7–18)
Calcium, Total: 8.9 mg/dL (ref 8.5–10.1)
Chloride: 102 mmol/L (ref 98–107)
Co2: 30 mmol/L (ref 21–32)
Creatinine: 1.36 mg/dL — ABNORMAL HIGH (ref 0.60–1.30)
EGFR (African American): >60
EGFR (Non-African Amer.): 53 — ABNORMAL LOW
Glucose: 121 mg/dL — ABNORMAL HIGH (ref 65–99)
Osmolality: 282 (ref 275–301)
Potassium: 3.6 mmol/L (ref 3.5–5.1)
Sodium: 141 mmol/L (ref 136–145)

## 2014-01-06 LAB — CBC WITH DIFFERENTIAL/PLATELET
Basophil #: 0 10*3/uL (ref 0.0–0.1)
Basophil %: 0.5 %
Eosinophil #: 0.5 10*3/uL (ref 0.0–0.7)
Eosinophil %: 6.8 %
HCT: 35.7 % — ABNORMAL LOW (ref 40.0–52.0)
HGB: 12 g/dL — ABNORMAL LOW (ref 13.0–18.0)
Lymphocyte #: 1.3 10*3/uL (ref 1.0–3.6)
Lymphocyte %: 17 %
MCH: 31.3 pg (ref 26.0–34.0)
MCHC: 33.7 g/dL (ref 32.0–36.0)
MCV: 93 fL (ref 80–100)
Monocyte #: 0.8 x10 3/mm (ref 0.2–1.0)
Monocyte %: 10.6 %
Neutrophil #: 5.1 10*3/uL (ref 1.4–6.5)
Neutrophil %: 65.1 %
Platelet: 134 10*3/uL — ABNORMAL LOW (ref 150–440)
RBC: 3.84 10*6/uL — ABNORMAL LOW (ref 4.40–5.90)
RDW: 14.2 % (ref 11.5–14.5)
WBC: 7.8 10*3/uL (ref 3.8–10.6)

## 2014-01-08 DIAGNOSIS — R5383 Other fatigue: Secondary | ICD-10-CM | POA: Diagnosis not present

## 2014-01-08 DIAGNOSIS — E1142 Type 2 diabetes mellitus with diabetic polyneuropathy: Secondary | ICD-10-CM | POA: Diagnosis not present

## 2014-01-08 DIAGNOSIS — I25810 Atherosclerosis of coronary artery bypass graft(s) without angina pectoris: Secondary | ICD-10-CM | POA: Diagnosis not present

## 2014-01-08 DIAGNOSIS — R0789 Other chest pain: Secondary | ICD-10-CM | POA: Diagnosis not present

## 2014-01-08 DIAGNOSIS — I8310 Varicose veins of unspecified lower extremity with inflammation: Secondary | ICD-10-CM | POA: Diagnosis not present

## 2014-01-08 DIAGNOSIS — J449 Chronic obstructive pulmonary disease, unspecified: Secondary | ICD-10-CM | POA: Diagnosis not present

## 2014-01-08 DIAGNOSIS — R5381 Other malaise: Secondary | ICD-10-CM | POA: Diagnosis not present

## 2014-01-08 DIAGNOSIS — I10 Essential (primary) hypertension: Secondary | ICD-10-CM | POA: Diagnosis not present

## 2014-01-08 DIAGNOSIS — E119 Type 2 diabetes mellitus without complications: Secondary | ICD-10-CM | POA: Diagnosis not present

## 2014-01-13 ENCOUNTER — Ambulatory Visit: Payer: Self-pay | Admitting: Ophthalmology

## 2014-01-13 DIAGNOSIS — Z7982 Long term (current) use of aspirin: Secondary | ICD-10-CM | POA: Diagnosis not present

## 2014-01-13 DIAGNOSIS — E119 Type 2 diabetes mellitus without complications: Secondary | ICD-10-CM | POA: Diagnosis not present

## 2014-01-13 DIAGNOSIS — Z79899 Other long term (current) drug therapy: Secondary | ICD-10-CM | POA: Diagnosis not present

## 2014-01-13 DIAGNOSIS — I498 Other specified cardiac arrhythmias: Secondary | ICD-10-CM | POA: Diagnosis not present

## 2014-01-13 DIAGNOSIS — H2510 Age-related nuclear cataract, unspecified eye: Secondary | ICD-10-CM | POA: Diagnosis not present

## 2014-01-13 DIAGNOSIS — H9190 Unspecified hearing loss, unspecified ear: Secondary | ICD-10-CM | POA: Diagnosis not present

## 2014-01-13 DIAGNOSIS — I10 Essential (primary) hypertension: Secondary | ICD-10-CM | POA: Diagnosis not present

## 2014-01-13 DIAGNOSIS — R0601 Orthopnea: Secondary | ICD-10-CM | POA: Diagnosis not present

## 2014-01-13 DIAGNOSIS — G589 Mononeuropathy, unspecified: Secondary | ICD-10-CM | POA: Diagnosis not present

## 2014-01-13 DIAGNOSIS — G4730 Sleep apnea, unspecified: Secondary | ICD-10-CM | POA: Diagnosis not present

## 2014-01-13 DIAGNOSIS — I2510 Atherosclerotic heart disease of native coronary artery without angina pectoris: Secondary | ICD-10-CM | POA: Diagnosis not present

## 2014-01-13 DIAGNOSIS — D619 Aplastic anemia, unspecified: Secondary | ICD-10-CM | POA: Diagnosis not present

## 2014-01-13 DIAGNOSIS — Z8673 Personal history of transient ischemic attack (TIA), and cerebral infarction without residual deficits: Secondary | ICD-10-CM | POA: Diagnosis not present

## 2014-01-13 DIAGNOSIS — H269 Unspecified cataract: Secondary | ICD-10-CM | POA: Diagnosis not present

## 2014-01-13 DIAGNOSIS — R609 Edema, unspecified: Secondary | ICD-10-CM | POA: Diagnosis not present

## 2014-01-13 DIAGNOSIS — Z951 Presence of aortocoronary bypass graft: Secondary | ICD-10-CM | POA: Diagnosis not present

## 2014-01-19 DIAGNOSIS — N183 Chronic kidney disease, stage 3 unspecified: Secondary | ICD-10-CM | POA: Insufficient documentation

## 2014-01-19 DIAGNOSIS — IMO0001 Reserved for inherently not codable concepts without codable children: Secondary | ICD-10-CM | POA: Insufficient documentation

## 2014-01-19 DIAGNOSIS — E119 Type 2 diabetes mellitus without complications: Secondary | ICD-10-CM

## 2014-01-19 DIAGNOSIS — R079 Chest pain, unspecified: Secondary | ICD-10-CM | POA: Diagnosis not present

## 2014-01-19 DIAGNOSIS — I10 Essential (primary) hypertension: Secondary | ICD-10-CM | POA: Diagnosis not present

## 2014-01-19 DIAGNOSIS — I25810 Atherosclerosis of coronary artery bypass graft(s) without angina pectoris: Secondary | ICD-10-CM | POA: Diagnosis not present

## 2014-01-19 DIAGNOSIS — G4733 Obstructive sleep apnea (adult) (pediatric): Secondary | ICD-10-CM | POA: Diagnosis not present

## 2014-01-19 DIAGNOSIS — E1140 Type 2 diabetes mellitus with diabetic neuropathy, unspecified: Secondary | ICD-10-CM | POA: Insufficient documentation

## 2014-01-19 DIAGNOSIS — J209 Acute bronchitis, unspecified: Secondary | ICD-10-CM | POA: Insufficient documentation

## 2014-01-19 DIAGNOSIS — J440 Chronic obstructive pulmonary disease with acute lower respiratory infection: Secondary | ICD-10-CM

## 2014-01-19 DIAGNOSIS — E1122 Type 2 diabetes mellitus with diabetic chronic kidney disease: Secondary | ICD-10-CM | POA: Insufficient documentation

## 2014-01-19 DIAGNOSIS — Z794 Long term (current) use of insulin: Secondary | ICD-10-CM

## 2014-02-12 DIAGNOSIS — R5383 Other fatigue: Secondary | ICD-10-CM | POA: Diagnosis not present

## 2014-02-12 DIAGNOSIS — I8310 Varicose veins of unspecified lower extremity with inflammation: Secondary | ICD-10-CM | POA: Diagnosis not present

## 2014-02-12 DIAGNOSIS — R5381 Other malaise: Secondary | ICD-10-CM | POA: Diagnosis not present

## 2014-02-12 DIAGNOSIS — I739 Peripheral vascular disease, unspecified: Secondary | ICD-10-CM | POA: Diagnosis not present

## 2014-02-12 DIAGNOSIS — E1142 Type 2 diabetes mellitus with diabetic polyneuropathy: Secondary | ICD-10-CM | POA: Diagnosis not present

## 2014-02-12 DIAGNOSIS — E119 Type 2 diabetes mellitus without complications: Secondary | ICD-10-CM | POA: Diagnosis not present

## 2014-02-12 DIAGNOSIS — J449 Chronic obstructive pulmonary disease, unspecified: Secondary | ICD-10-CM | POA: Diagnosis not present

## 2014-02-12 DIAGNOSIS — I10 Essential (primary) hypertension: Secondary | ICD-10-CM | POA: Diagnosis not present

## 2014-05-04 DIAGNOSIS — J449 Chronic obstructive pulmonary disease, unspecified: Secondary | ICD-10-CM | POA: Diagnosis not present

## 2014-05-04 DIAGNOSIS — J209 Acute bronchitis, unspecified: Secondary | ICD-10-CM | POA: Diagnosis not present

## 2014-05-04 DIAGNOSIS — E1165 Type 2 diabetes mellitus with hyperglycemia: Secondary | ICD-10-CM | POA: Diagnosis not present

## 2014-05-04 DIAGNOSIS — G4700 Insomnia, unspecified: Secondary | ICD-10-CM | POA: Diagnosis not present

## 2014-05-04 DIAGNOSIS — R0602 Shortness of breath: Secondary | ICD-10-CM | POA: Diagnosis not present

## 2014-05-14 DIAGNOSIS — J449 Chronic obstructive pulmonary disease, unspecified: Secondary | ICD-10-CM | POA: Diagnosis not present

## 2014-05-14 DIAGNOSIS — J209 Acute bronchitis, unspecified: Secondary | ICD-10-CM | POA: Diagnosis not present

## 2014-05-14 DIAGNOSIS — E1140 Type 2 diabetes mellitus with diabetic neuropathy, unspecified: Secondary | ICD-10-CM | POA: Diagnosis not present

## 2014-05-14 DIAGNOSIS — E1165 Type 2 diabetes mellitus with hyperglycemia: Secondary | ICD-10-CM | POA: Diagnosis not present

## 2014-05-14 DIAGNOSIS — M25579 Pain in unspecified ankle and joints of unspecified foot: Secondary | ICD-10-CM | POA: Diagnosis not present

## 2014-05-14 DIAGNOSIS — I10 Essential (primary) hypertension: Secondary | ICD-10-CM | POA: Diagnosis not present

## 2014-05-14 DIAGNOSIS — Z23 Encounter for immunization: Secondary | ICD-10-CM | POA: Diagnosis not present

## 2014-05-19 DIAGNOSIS — G4733 Obstructive sleep apnea (adult) (pediatric): Secondary | ICD-10-CM | POA: Diagnosis not present

## 2014-05-19 DIAGNOSIS — I25810 Atherosclerosis of coronary artery bypass graft(s) without angina pectoris: Secondary | ICD-10-CM | POA: Diagnosis not present

## 2014-05-19 DIAGNOSIS — E782 Mixed hyperlipidemia: Secondary | ICD-10-CM | POA: Diagnosis not present

## 2014-05-19 DIAGNOSIS — I10 Essential (primary) hypertension: Secondary | ICD-10-CM | POA: Diagnosis not present

## 2014-06-22 DIAGNOSIS — J449 Chronic obstructive pulmonary disease, unspecified: Secondary | ICD-10-CM | POA: Diagnosis not present

## 2014-06-22 DIAGNOSIS — E119 Type 2 diabetes mellitus without complications: Secondary | ICD-10-CM | POA: Diagnosis not present

## 2014-06-22 DIAGNOSIS — D509 Iron deficiency anemia, unspecified: Secondary | ICD-10-CM | POA: Diagnosis not present

## 2014-06-22 DIAGNOSIS — J069 Acute upper respiratory infection, unspecified: Secondary | ICD-10-CM | POA: Diagnosis not present

## 2014-06-29 DIAGNOSIS — E119 Type 2 diabetes mellitus without complications: Secondary | ICD-10-CM | POA: Diagnosis not present

## 2014-06-29 DIAGNOSIS — R944 Abnormal results of kidney function studies: Secondary | ICD-10-CM | POA: Diagnosis not present

## 2014-06-29 DIAGNOSIS — D509 Iron deficiency anemia, unspecified: Secondary | ICD-10-CM | POA: Diagnosis not present

## 2014-06-29 DIAGNOSIS — J069 Acute upper respiratory infection, unspecified: Secondary | ICD-10-CM | POA: Diagnosis not present

## 2014-06-29 DIAGNOSIS — I10 Essential (primary) hypertension: Secondary | ICD-10-CM | POA: Diagnosis not present

## 2014-06-29 DIAGNOSIS — E1140 Type 2 diabetes mellitus with diabetic neuropathy, unspecified: Secondary | ICD-10-CM | POA: Diagnosis not present

## 2014-07-07 DIAGNOSIS — Z23 Encounter for immunization: Secondary | ICD-10-CM | POA: Diagnosis not present

## 2014-07-07 DIAGNOSIS — J449 Chronic obstructive pulmonary disease, unspecified: Secondary | ICD-10-CM | POA: Diagnosis not present

## 2014-07-07 DIAGNOSIS — R0602 Shortness of breath: Secondary | ICD-10-CM | POA: Diagnosis not present

## 2014-07-07 DIAGNOSIS — J209 Acute bronchitis, unspecified: Secondary | ICD-10-CM | POA: Diagnosis not present

## 2014-07-07 DIAGNOSIS — E1165 Type 2 diabetes mellitus with hyperglycemia: Secondary | ICD-10-CM | POA: Diagnosis not present

## 2014-09-25 NOTE — Discharge Summary (Signed)
PATIENT NAME:  Adam Merritt, DODA MR#:  [id number] DATE OF BIRTH:  [birth year]  DATE OF ADMISSION:  01/01/2013  DATE OF DISCHARGE:  01/04/2013  DISCHARGE DIAGNOSES: 1.  Chronic obstructive pulmonary disease exacerbation.   3.  Acute on chronic renal failure.  4.  Hypertension.  5.  Diabetes. 7 6.  Obesity.  7.  Obstructive sleep apnea.  8.  Hyperlipidemia.   CONDITION ON DISCHARGE: Stable.   CODE STATUS ON DISCHARGE:  Full Code.    MEDICATIONS ON DISCHARGE: 1.  Aspirin 325 mg once a day.  2.  Carvedilol 25 mg 2 times a day.  3.  Atorvastatin 80 mg oral tablet once a day.  4.  Multivitamin with minerals once a day.  5.  Omeprazole 20 mg once a day.  6.  Zolpidem 12.5 mg oral once a day.  7.  Gabapentin 100 mg, take 2 capsules 3 times a day.  8.  Symbicort 2 puffs 2 times a day.  9.  Prednisone 10 mg, start 60 mg and taper x 10 mg daily until complete.  10.  Glipizide 5 mg oral 2 times a day.  11.  Amlodipine 10 mg once a day.  12.  Furosemide 500 mg 2 times a day for 12 days.  13.  Furosemide 20 mg once a day.  14.  Montelukast 10 mg once a day.  15.  Albuterol 2 puffs inhaled 4 times a day as needed for shortness of breath.   DIET: Low sodium, low fat, low cholesterol, carbohydrate-controlled ADA diet. Diet consistency: Regular.   ACTIVITY LIMITATION: As tolerated.   FOLLOWUP:  Timeframe to follow-up within 1 to 2 weeks with Dr. Raul Del and also advised to check kidney function in 1 to 2 weeks for correction, and as routine with PMD.    HISTORY OF PRESENT ILLNESS: The patient is a 69 year old man who had bronchitis symptoms for 11 days, shortness of breath and cough. He took antibiotic, Levaquin, for seven days, but his coughing was not going away, and he was hardly able to eat anything because of that and shortness of breath. He was also not able to sleep properly because of cough and shortness of breath. He also had diarrhea on and off and felt worse, and so decided to come to  the hospital for further [place]:   1.  Acute bronchitis.  The patient's blood culture done on admission was positive for Staphylococcus epidermides. Renal function was improving gradually.  2.  Diabetes. We maintained him on Lantus 10 units subcutaneous and held his metformin.  3.  Hyperlipidemia. We continued home medication.  4.  Coronary artery disease history. We continued Coreg and aspirin and statin.   5.  Hypertension. Remained under control with Coreg.  6.  Sleep apnea. He was unable to tolerate CPAP and did not want to have it.  7.  History of cerebrovascular accident. We continued aspirin and statin.  8.  Diarrhea. The plan was to send stool for C. difficile, but the patient's diarrhea stopped and he was not able to give any sample, and so we just cancelled this now, as there was no more diarrhea.    LABORATORY RESULTS:  On presentation, creatinine was 1.95. WBC was 11,600. Hemoglobin was 12.1. Blood culture was positive for Staph epidermidis which was sensitive to oxacillin. His creatinine level gradually improved to 1.56 on August 1.  Blood cultures repeated on August 1 were negative.   Total time spent  on this discharge: 45 minutes.    ____________________________ Ceasar Lund Anselm Jungling, MD vgv:nts D: 01/07/2013 22:08:19 ET T: 01/08/2013 04:46:52 ET JOB#: [id number]  cc: Ceasar Lund. Anselm Jungling, MD, <Dictator> Herbon E. Raul Del, MD [name], MD  Rosalio Macadamia [facility] MD ELECTRONICALLY SIGNED 01/16/2013 1:34

## 2014-09-25 NOTE — H&P (Signed)
PATIENT NAME:  Adam Merritt, Adam Merritt MR#:  [id number] DATE OF BIRTH:  1945/07/27  DATE OF ADMISSION:  01/01/2013  PRIMARY CARE PHYSICIAN: Dr. [name].   CARDIOLOGIST: Dr. Nehemiah Massed.   CHIEF COMPLAINT: Shortness of breath and cough.   HISTORY OF PRESENT ILLNESS: This is a 69 year old man who has been battling a bronchitis for 11 days with shortness of breath and cough. He has been on antibiotics, Levaquin. This is day 7. He could not stop coughing. He can hardly eat. He can hardly sleep. He has been having diarrhea on and off. He actually feels worse than last week. Hospitalist services were contacted for further evaluation.   PAST MEDICAL HISTORY: Aplastic anemia, coronary artery disease, hypertension, hyperlipidemia, diabetes, edema, sleep apnea, CVA and obesity.   PAST SURGICAL HISTORY: CABG, a mole on the head, cholecystectomy.   ALLERGIES: No known drug allergies.   MEDICATIONS: Include aspirin 325 mg daily, atorvastatin 80 mg at bedtime, Coreg 25 mg twice a day, Lasix 40 mg twice a day, gabapentin 100 mg 2 tablets 3 times a day, glyburide/metformin 2.5/500 two tablets twice a day, ibuprofen 800 mg 3 times a day as needed for pain, Levaquin day 7 of ten 500 mg daily, multivitamin 1 tablet daily, omeprazole 20 mg daily, Slo-Niacin 500 mg extended release 2 tablets at bedtime, Symbicort 2 puffs twice a day 160/4.5, Ambien 12.5 mg extended release nightly.   SOCIAL HISTORY: No smoking. Did stop drinking alcohol in 1989. 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   FAMILY HISTORY: Mother died of COPD. Father died of alcohol. Brother died of alcohol. Sister with hypertension and heart disease.   REVIEW OF SYSTEMS:   CONSTITUTIONAL: Positive for sweats. Positive for weight gain. Positive for fatigue and weakness.  EYES: Does wear glasses.  EARS, NOSE, MOUTH AND THROAT: Decreased hearing. Positive for runny nose. No sore throat.  CARDIOVASCULAR: No chest  pain. No palpitations.  RESPIRATORY: Positive for shortness of breath. Positive for cough. Positive for wheeze. No hemoptysis. Does not look at the color of his sputum.  GASTROINTESTINAL: Positive for vomiting the other day. Positive for nausea, some lower abdominal pain and diarrhea.  GENITOURINARY: No burning on urination. No hematuria.  MUSCULOSKELETAL: History of joint pain.  INTEGUMENT: Chronic lower extremity discoloration.  NEUROLOGIC: No fainting or blackouts.  PSYCHIATRIC: No anxiety.  ENDOCRINE: No thyroid problems.  HEMATOLOGIC AND LYMPHATIC: History of aplastic anemia.   PHYSICAL EXAMINATION:  VITAL SIGNS: Temperature 98.6, pulse 88, respirations 22, blood pressure 165/64, pulse ox 99%.  GENERAL: No respiratory distress. Continuously coughing.  EYES: Conjunctivae and lids normal. Pupils equal, round and reactive to light. Extraocular muscles intact. No nystagmus.  EARS, NOSE, MOUTH AND THROAT: Tympanic membranes: No erythema. Nasal mucosa: No erythema. Throat: No erythema. No exudate seen. Lips and gums: No lesions.  NECK: No JVD. No bruits. No lymphadenopathy. No thyromegaly. No thyroid nodules palpated.  RESPIRATORY: Poor inspiratory effort. Positive wheeze throughout entire lung field.  CARDIOVASCULAR SYSTEM: S1, S2 normal. No gallops, rubs or murmurs heard. Carotid upstroke 2+ bilaterally. No bruits.  EXTREMITIES: Dorsalis pedis pulses difficult to palpate secondary to 3+ edema of the lower extremity.  ABDOMEN: Soft, slight tenderness in the lower abdomen. No organo- or splenomegaly. Normoactive bowel sounds. No masses felt.  LYMPHATIC: No lymph nodes in the neck.  MUSCULOSKELETAL: Edema 3+. No clubbing. No cyanosis.  SKIN: Chronic lower extremity discoloration bilateral lower extremities. Slight pinkish but no warmth.  NEUROLOGIC:  Cranial nerves II through XII grossly intact. Deep tendon reflexes 0.5+ bilateral lower extremities.  PSYCHIATRIC: The patient is oriented to  person, place and time.   LABORATORY AND RADIOLOGICAL DATA: EKG showed a normal sinus rhythm, 93 beats per minute. No acute ST-T wave changes. Chest x-ray shows low lung volumes. Mild basilar opacities likely secondary to atelectasis. Troponin negative. Glucose 128, BUN 18, creatinine 1.95, sodium 137, potassium 4.1, chloride 104, CO2 26, calcium 9.2. White blood cell count 11.6, H and H 12.1 and 36.1, platelet count of 192. BNP 125.   ASSESSMENT AND PLAN:  1. Asthmatic bronchitis: I will give low-dose Solu-Medrol 40 mg intravenous x1 and 20 mg intravenous b.i.d. Will give budesonide nebulizer, DuoNeb nebulizer. Continue Symbicort. Will change Levaquin to Rocephin and Zithromax. I think the reason why he is not getting better is he is not on steroids. The patient was very hesitant about steroids at this point, stating that his sugars go up very high when put on steroids.  2. Acute renal failure: Will hold Lasix. Give 1 liter of intravenous fluid. Glucophage is contraindicated. Ibuprofen is contraindicated.  3. Diabetes: The patient states that sugars go up very high when on steroids. Will start Lantus 10 units subcutaneous injection at bedtime. Put on sliding scale. Continue Glyburide. Glucophage is contraindicated.  4. Hyperlipidemia: Continue current medications.  5. History of coronary artery disease, on Coreg and aspirin.  6. Hypertension: Continue Coreg.  7. Sleep apnea and obesity: Unable to tolerate CPAP.  8. History of cerebrovascular accident, on aspirin.  9. Diarrhea: Will send off a stool for Clostridium difficile.    TIME SPENT ON ADMISSION: 55 minutes.   CODE STATUS: The patient is a FULL CODE.   ____________________________ Tana Conch. Leslye Peer, MD rjw:gb D: 01/01/2013 20:38:39 ET T: 01/01/2013 21:03:53 ET JOB#: [id number]  cc: Tana Conch. Leslye Peer, MD, <Dictator> [name], MD Corey Skains, MD Marisue Brooklyn MD ELECTRONICALLY SIGNED 01/13/2013 11:53

## 2014-09-26 NOTE — Op Note (Signed)
PATIENT NAME:  Adam Merritt, Adam Merritt MR#:  [id number] DATE OF BIRTH:  06/27/1945  DATE OF PROCEDURE:  01/13/2014  LOCATION:  [place]  PREOPERATIVE DIAGNOSIS: Visually significant cataract of the right eye.   POSTOPERATIVE DIAGNOSIS: Visually significant cataract of the right eye.   OPERATIVE PROCEDURE: Cataract extraction by phacoemulsification with implant of intraocular lens to right eye.   SURGEON: Birder Robson, MD.   ANESTHESIA:  1. Managed anesthesia care.  2. Topical tetracaine drops followed by 2% Xylocaine jelly applied in the preoperative holding area.   COMPLICATIONS: None.   TECHNIQUE:  Stop and chop.  DESCRIPTION OF PROCEDURE: The patient was examined and consented in the preoperative holding area where the aforementioned topical anesthesia was applied to the right eye and then brought back to the Operating Room where the right eye was prepped and draped in the usual sterile ophthalmic fashion and a lid speculum was placed. A paracentesis was created with the side port blade and the anterior chamber was filled with viscoelastic. A near clear corneal incision was performed with the steel keratome. A continuous curvilinear capsulorrhexis was performed with a cystotome followed by the capsulorrhexis forceps. Hydrodissection and hydrodelineation were carried out with BSS on a blunt cannula. The lens was removed in a stop and chop technique and the remaining cortical material was removed with the irrigation-aspiration handpiece. The capsular bag was inflated with viscoelastic and the Tecnis ZCB00 21.0 diopter lens, serial [id number] was placed in the capsular bag without complication. The remaining viscoelastic was removed from the eye with the irrigation-aspiration handpiece. The wounds were hydrated. The anterior chamber was flushed with Miostat and the eye was inflated to physiologic pressure. 0.1 mL of cefuroxime concentration 10 mg/mL was placed in the anterior chamber.  The wounds were found to be water tight. The eye was dressed with Vigamox and Combigan. The patient was given protective glasses to wear throughout the day and a shield with which to sleep tonight. The patient was also given drops with which to begin a drop regimen today and will follow-up with me in one day.     ____________________________ Livingston Diones. Malan Werk, MD wlp:DT D: 01/13/2014 19:16:51 ET T: 01/13/2014 20:05:04 ET JOB#: [id number]  cc: Tonda Wiederhold L. Rhen Kawecki, MD, <Dictator> Livingston Diones Mukhtar Shams MD ELECTRONICALLY SIGNED 01/14/2014 11:28

## 2014-09-26 NOTE — Discharge Summary (Signed)
PATIENT NAME:  Adam Merritt, Adam Merritt MR#:  [id number] DATE OF BIRTH:  [birth year]  DATE OF ADMISSION:  01/05/2014 DATE OF DISCHARGE:  01/06/2014  ADMITTING PHYSICIAN:  Gladstone Lighter, M.D.   DISCHARGING PHYSICIAN:  Gladstone Lighter, M.D.   PRIMARY CARE PHYSICIAN: [name], M. D.   PRIMARY CARDIOLOGIST:  Corey Skains, M.D.   CONSULTATIONS IN THE HOSPITAL: Cardiology consultation with Dr. [name].   DISCHARGE DIAGNOSES:  1.  Chest pain, likely gastroesophageal reflux disease versus unstable angina.  The patient wanted to get an outpatient cardiac catheterization.  2.  Coronary artery disease, status post bypass graft surgery in 1996.  3.  Chronic obstructive pulmonary disease, not on any home oxygen.  4.  Obstructive sleep apnea, unable to tolerate CPAP.  5.  Insulin-dependent diabetes mellitus.  6.  Diabetic neuropathy.  7.  History of aplastic anemia. 8.  Chronic kidney disease stage III.  9.  Hypertension.  10. Gastroesophageal reflux disease.   HOME MEDICATIONS:   1.  Aspirin 325 mg p.o. daily.  2.  Carvedilol 25 mg p.o. b.i.d.  3.  Symbicort 160/4.5 two puffs b.i.d.  4.  Montelukast 10 mg p.o. daily.  5.  Albuterol inhaler 2 puffs 4 times a day as needed for shortness of breath.  6.  Victoza 18 mg/3 mL subcutaneous solution, 1 application subcutaneously daily.  7.  Oxycodone 5 mg q. 8 hours p.r.n. for pain.  8.  Gabapentin 100 mg 4 capsules 3 times a day. 9.  Lipitor 40 mg p.o. daily.  10. Lasix 40 mg p.o. daily.  11. Colace 100 mg p.o. b.i.d.  12. Hydralazine 25 mg p.o. 3 times a day.  13. Multivitamin with minerals 1 tablet p.o. daily.  14. Ambien 12.5 mg 1 tablet at bedtime.  15. Omeprazole 20 mg p.o. b.i.d.  15. Maalox 30 mL q. 6 hours p.r.n. for dyspepsia.  16. Sublingual nitroglycerin 0.4 mg p.r.n. for chest pain.   DISCHARGE DIET: Low-sodium, ADA 1800 calorie diet.   DISCHARGE ACTIVITY: As tolerated.   FOLLOWUP INSTRUCTIONS:  1.  PCP followup in 1-2  weeks.  2.  Cardiology followup in 1-2 weeks.   LABORATORIES AND IMAGING STUDIES PRIOR TO DISCHARGE: WBC 7.8, hemoglobin 12.0, hematocrit 35.7, platelet count 134,000, sodium 141, potassium 3.6, chloride 102, bicarbonate 30, BUN 11, creatinine 1.36, glucose 121 and calcium of 8.9. Troponins have remained negative during the hospital course. BNP is borderline at 144, INR is 1.0.   Echocardiogram Doppler showing LV ejection fraction of 50-55%, borderline LV hypertrophy.    Chest x-ray on admission showing no acute cardiopulmonary disease. Changes of CABG reflected. No effusion, edema or infiltrate noted.   BRIEF HOSPITAL COURSE: Mr. Venegas is an obese 69 year old Caucasian male with past medical history significant for coronary artery disease, status post bypass graft surgery, COPD, not on home oxygen, hypertension, diabetes, sleep apnea, not on CPAP, who presented to the hospital secondary to chest pain.  1.  Chest pain. It was a burning pain in the chest, could have been gastroesophageal reflux disease. The patient could not tolerate a stress test because he is extremely claustrophobic. He was monitored on telemetry. Enzymes remain negative and he has not had further episodes of chest pain requiring any nitroglycerin. He was seen by cardiologist, Dr. [name], who recommended that if his symptoms did not improve he might need a cardiac catheterization to reveal his anatomy. His bypass surgery was in 1996 and after that he has been stable and has not needed any  stents. The patient was feeling great and he wanted to go home and want to have cardiac catheterization evaluation done as an outpatient if needed. All his cardiac medications are being continued at the time of discharge. The patient is on aspirin, Coreg and sublingual nitroglycerin as needed, and a statin.  2.  Gastroesophageal reflux disease. Could have been the cause for his pain. He is started on Maalox p.r.n. for dyspepsia and also his Prilosec  is increased to b.i.d.  3.  Diabetes mellitus. The patient is on Victoza which is continued.  4.  Chronic obstructive pulmonary disease, stable. His inhalers are being continued. The patient has a diagnosis of sleep apnea but could not tolerate CPAP at home.  5.  Chronic kidney disease stage III. At baseline, not on any ACE or ERB due to that.  Echo showed normal ejection fraction of 50%. No wall motion abnormalities noted.   His course has been otherwise uneventful in the hospital.   DISCHARGE CONDITION: Stable.   DISCHARGE DISPOSITION: Home.   Time spent on discharge: 45 minutes.    ____________________________ Gladstone Lighter, MD rk:lt D: 01/06/2014 12:51:32 ET T: 01/06/2014 13:13:02 ET JOB#: [id number]  cc: Gladstone Lighter, MD, <Dictator> Corey Skains, MD [name], MD  Gladstone Lighter MD ELECTRONICALLY SIGNED 01/20/2014 14:03

## 2014-09-26 NOTE — Consult Note (Signed)
PATIENT NAME:  Adam Merritt, Adam Merritt MR#:  [id number] DATE OF BIRTH:  1946-03-21  DATE OF CONSULTATION:  01/05/2014  REFERRING PHYSICIAN:   CONSULTING PHYSICIAN:  Mitzi Lilja D. [name], MD  FAMILY PHYSICIAN:  [name], MD  INDICATION: Chest pain.   HISTORY OF PRESENT ILLNESS: The patient is a 69 year old white male with history of coronary artery disease, , and diabetes, diabetic neuropathy, COPD, obstructive sleep apnea, aplastic anemia, hypertension, reflux disease who presented to the hospital with chest pain symptoms and chest pressure for over 24 hours.  Patient states she has had bypass surgery in 1996. Since then, he has been doing fine without significant followup and studies.  He has seen Dr. Nehemiah Massed in the past, but has not had any functional studies. No catheterization done. Patient states he is  claustrophobic, so he refuses to have a stress test. The patient noticed burning in his chest mid sternal with radiation to his shoulders. He had a band-like tightness similar to when he had his bypass.  It was waxing, waning, and nagging all day. He woke up suddenly in the middle of the night with diaphoresis, sick to his stomach, and chest pain. Presented to Emergency Room. He was able to be given aspirin. Became pain-free. Has a history of reflux disease. Is not clear. The symptoms are not related to that. He was to be admitted for further evaluation and management.  PAST MEDICAL HISTORY: Coronary artery disease, COPD, diabetes, diabetic neuropathy, obesity, obstructive sleep apnea, reflux, history of aplastic anemia, hypertension, renal insufficiency.   PAST SURGICAL HISTORY: Coronary bypass surgery, cholecystectomy, skin surgery on scalp for mole removal.   ALLERGIES:  None.   MEDICATIONS: Lasix 40 a day, Colace 100 a day, gabapentin 400 two times a day, aspirin 325 a day, hydralazine 25 three times a day, Lipitor 40 a day, multivitamin 1 tablet daily, Coreg 25 twice a day, Prilosec 20 mg a day.    SOCIAL HISTORY: Lives with his wife. Denies smoking or alcohol use.  Uses a cane because of unsteady gait.  FAMILY HISTORY: Alcoholism.   REVIEW OF SYSTEMS: Denies  . Denies significant nausea or vomiting. No fever.  No chills. No cough. No weight loss. No weight gain. No hemoptysis.  No  blood per rectum. Has had some chest discomfort.   PHYSICAL EXAMINATION:  VITAL SIGNS: Blood pressure 126/70, pulse of 100, respiratory rate of 14, afebrile.  HEENT: Normocephalic, atraumatic. Pupils equal and reactive to light.  NECK: Supple. No significant JVD or adenopathy.  LUNGS: Clear to auscultation and percussion. No significant wheeze, rhonchi, or rale.  HEART: Regular rate and rhythm. Positive bowel sounds. No rebound, guarding, or tenderness.  EXTREMITIES: Within normal limits.  NEUROLOGIC: Intact.  SKIN: Normal.   LABORATORY: White count of 11, hemoglobin 12.7, hematocrit 39, platelet count 115,000. Sodium 140, potassium 4.3, chloride 102, bicarbonate 31, BUN of 14, creatinine 1.6, calcium of 8.9.  Troponin less than 0.02. INR 1.0. BNP 144.   CHEST X-RAY: Cardiomegaly, nonspecific finding.   EKG: Sinus tachycardia, nonspecific ST-T wave changes.   ASSESSMENT:  1.  Possible unstable angina.  2.  Chest pain.  3.  Obesity.  4.  Coronary artery disease.  5.  History of hypertension. 6.  Reflux. 7.  Diabetes. 8.  Chronic obstructive pulmonary disease. 9.  Possible obstructive sleep apnea.   PLAN:  1.  Agree with admit. Rule out for myocardial infarction. Follow up cardiac enzymes. Follow up EKG. Anticoagulation for possible acute coronary syndrome. DVT prophylaxis.  Echocardiogram will be helpful for assessment of reflux   . Recommended blockade therapy in addition to nitrate.   GI workup  2.  Renal insufficiency. Continue mild hydration. Oxygen therapy as necessary. If the patient rules out, would consider further work-up. He is unable to tolerate a nuclear study and cannot walk for a  regular treadmill. Will consider whether catheterization is indicated even though he has renal insufficiency. Continue the patient   in that regard.    ____________________________ Loran Senters [name], MD ddc:am D: 01/05/2014 23:57:00 ET T: 01/06/2014 05:50:07 ET JOB#: [id number]  cc: Braun Rocca D. [name], MD, <Dictator> Yolonda Kida MD ELECTRONICALLY SIGNED 01/28/2014 14:05

## 2014-09-26 NOTE — Consult Note (Signed)
Brief Consult Note: Diagnosis: [place]/CAD/DM/OSA.   Patient was seen by consultant.   Consult note dictated.   Recommend further assessment or treatment.   Orders entered.   Discussed with Attending MD.   Comments: [place] cp cad htn DM Obesity COPD Hyperlipidemia GERD . PLAN ROMI NTG Insulin for DM 02 Inhalers HTN control Agree with ECHO Consider cardiac cath.  Electronic Signatures: Lujean Amel D (MD)  (Signed 04-Aug-15 07:34)  Authored: Brief Consult Note   Last Updated: 04-Aug-15 07:34 by Lujean Amel D (MD)

## 2014-09-26 NOTE — H&P (Signed)
PATIENT NAME:  Adam Merritt, Adam Merritt MR#:  [id number] DATE OF BIRTH:  December 25, 1945  DATE OF ADMISSION:  01/05/2014  ADMITTING PHYSICIAN: Gladstone Lighter, M.D.   PRIMARY CARE PHYSICIAN: Dr. [name].   CHIEF COMPLAINT: Chest pain.   PRIMARY CARDIOLOGIST: Dr. Nehemiah Massed.   HISTORY OF PRESENT ILLNESS: Adam Merritt is a very obese, 69 year old Caucasian male, with past medical history significant for coronary artery disease status post bypass graft surgery, diabetes, diabetic neuropathy, COPD, obstructive sleep apnea, history of aplastic anemia, hypertension, reflux disease, who presents to the hospital secondary to chest pain that started yesterday. The patient states that his bypass surgery was in 1996. Since then, he has been doing fine, has not had a recent stress test because he is extremely claustrophobic and cannot go in the stress test machine. He is being followed by Dr. Nehemiah Massed almost once every year. Since yesterday, he noticed that he had a burning kind of pain in the left chest that was radiating up to the shoulder, and down his arm. He did not feel any bandlike tightness, which he had initially when he had the bypass surgery. It was nagging all day yesterday, and he went to bed last night, but he woke up suddenly in the middle of the night with diaphoresis, extremely sick to his stomach, worsening chest pain and presented to the ER. He did get aspirin here in the ER, and he has been chest pain free since then. He also has reflux disease and has been belching a lot. He does not know if this is his reflux acting up or unstable angina. His first set of troponins has been negative.   PAST MEDICAL HISTORY:  1. Coronary artery disease, status post bypass graft surgery in 1996.  2. COPD.  3. Diabetes mellitus.  4. Diabetic neuropathy.  5. History of aplastic anemia.  6. Gastroesophageal reflux disease.  7. Hypertension.  8. Obstructive sleep apnea, intolerant to CPAP.  9. CKD, stage III.  PAST  SURGICAL HISTORY:  1. Coronary artery bypass graft surgery.  2. Cholecystectomy.  3. Skin surgery on the scalp for a mole removal.   ALLERGIES TO MEDICATIONS: No known drug allergies.   CURRENT HOME MEDICATIONS:  1.         Zolpidem 12.5mg  extended releasetab, p.o. at bedtime.  2. Lasix 40 mg p.o. daily.  3. Colace 100 mg p.o. daily.  4. Gabapentin 400 mg p.o. t.i.d.  5. Montelukast 10 mg p.o. daily.  6. Aspirin 325 mg p.o. daily.  7. Hydralazine 25 mg 3 times a day. 8. Lipitor 40 mg p.o. daily.  9. Multivitamin 1 tablet p.o. daily.  10. Coreg 25 mg p.o. b.i.d.  11. Prilosec 20 mg p.o. daily.   SOCIAL HISTORY: Lives at home with his wife. Unsteady gait. No smoking or alcohol use. He uses a cane at baseline.   FAMILY HISTORY: Mom with . Dad had died from complications of alcohol.   REVIEW OF SYSTEMS:  CONSTITUTIONAL: No fever, fatigue, or weakness.  EYES: No double vision or blurred vision, has cataracts. Scheduled for cataract surgery next week. No glaucoma or inflammation. ENT: No tinnitus, ear pain, epistaxis or discharge.  RESPIRATORY: No cough, no wheezing. Positive for history of COPD and sleep apnea, and positive dyspnea on exertion.  CARDIOVASCULAR: Posterior chest pain. No orthopnea, edema, arrhythmia, palpitations or syncope.  GASTROINTESTINAL: No nausea or vomiting, diarrhea, abdominal pain, hematemesis or melena.  GENITOURINARY: No dysuria, hematuria, renal calculus, frequency or incontinence.  ENDOCRINE: No polyuria, nocturia,  thyroid problems, heat or cold intolerance.  HEMATOLOGY: No anemia, easy bruising or bleeding. SKIN: No acne, rash or lesions. MUSCULOSKELETAL: no joint swelling or tenderness. NEUROLOGIC: Positive for peripheral neuropathy. No CVA, TIA or seizures.  PSYCHOLOGICAL: No anxiety or depression.   PHYSICAL EXAMINATION:  VITAL SIGNS: Temperature 97.3 degrees Fahrenheit, pulse 99, respirations 20, blood pressure 126/72, pulse oximetry 91% on room  air and 94% on 2 liters nasal cannula.  GENERAL: Well-developed, well-nourished male, lying in bed, not in any acute distress. HEENT: Normocephalic, atraumatic. Pupils equal, round, and reactive to light. Anicteric sclerae. Extraocular movements are intact. Oropharynx is clear. No erythema, mass or exudates. NECK: Supple, no thyromegaly, JVD, or carotid bruits, and no lymphadenopathy noted. Normal range of motion without pain.  RESPIRATORY: Moving air well bilaterally. No wheezes or crackles. No use of accessory muscles for breathing  CARDIOVASCULAR: S1, S2 regular rate and rhythm, systolic murmur heard. No rubs or gallops. ABDOMEN: Obese, soft, nontender, nondistended. No hepatosplenomegaly, normal bowel tones. EXTREMITIES: Has 3+ pedal edema, no clubbing or cyanosis. Unable to palpate dorsalis pedis pulses.  SKIN: No acne, rash or lesions. Has chronic skin changes on bilateral lower extremities from chronic edema.  LYMPHATICS: No cervical lymphadenopathy.  NEUROLOGIC: Cranial nerves intact. No focal neuro deficits. The patient is awake, alert oriented x 3.   LABORATORY DATA: WBC 11.2, hemoglobin 12.7, hematocrit 39.1, platelet count 115,000. Sodium 140, potassium 4.3, chloride 102, bicarbonate 31, BUN 14, creatinine 1.6, glucose 161,  calcium of 8.9. Troponin is less than 0.02. CK 90, CK-MB 1.2.   Chest x-ray showing cardiomegaly, post bypass graft surgery changes. No edema or consolidation, effusion, or pneumothorax. there is chronic pleural or extrapleural thickening noted along mid left chest wall noted, which is likely the scar.   INR is 1.0. BNP is slightly elevated at 144.   EKG showing sinus tachycardia, heart rate of 115. No acute ST-T wave changes.   ASSESSMENT AND PLAN: A 69 year old male with past medical history significant for hypertension, diabetes, coronary artery disease status post bypass graft surgery, presenting with chest pain.   1. Chest pain. Could be either unstable  angina versus gastroesophageal reflux disease because of the burning pain. However, with his risk of cardiac history, he is being admitted under observation. Admit to telemetry. We will check cardiac enzymes x 3 sets, echocardiogram. Ideally would like to do a stress test; however, the patient is extremely claustrophobic and cannot undergo even a chemical stress test. We will place a cardiology consult for Dr. Nehemiah Massed. Continue cardiac medications, aspirin, Coreg, statin. Also nitroglycerin has been added for further chest pain.  2. Gastroesophageal reflux disease. Increase his PPI to b.i.d. and add Maalox p.r.n. at this time and monitor.  3. Hypertension. Continue home medications.  4. Diabetes mellitus. The patient is on Victoza. 5. Chronic obstructive pulmonary disease and obstructive sleep apnea, unable to tolerate CPAP. Continue inhalers. Lungs are clear at this time.  6. Deep vein thrombosis prophylaxis with Lovenox.   CODE STATUS: Full Code.   TIME SPENT ON ADMISSION: 50 minutes.   ____________________________ Gladstone Lighter, MD rk:jr D: 01/05/2014 13:59:05 ET T: 01/05/2014 15:21:29 ET JOB#: [id number]  cc: Gladstone Lighter, MD, <Dictator> [name], MD Corey Skains, MD  Gladstone Lighter MD ELECTRONICALLY SIGNED 01/20/2014 14:03

## 2014-09-26 NOTE — Consult Note (Signed)
Chief Complaint:  Subjective/Chief Complaint Pt states no cp today. No sob. He does not want a cath and refuses a myoview because of clastophobia.Feels ready to go home.   VITAL SIGNS/ANCILLARY NOTES: **Vital Signs.:   04-Aug-15 07:53  Pulse Pulse 92  Respirations Respirations 20  Systolic BP Systolic BP 782  Diastolic BP (mmHg) Diastolic BP (mmHg) 90  Mean BP 111  Pulse Ox % Pulse Ox % 94  Pulse Ox Activity Level  At rest  Oxygen Delivery Room Air/ 21 %  *Intake and Output.:   04-Aug-15 08:15  Grand Totals Intake:  240 Output:      Net:  240 24 Hr.:  240  Oral Intake      In:  240  Percentage of Meal Eaten  80   Brief Assessment:  GEN well developed, well nourished, no acute distress   Cardiac Regular   Respiratory normal resp effort   Gastrointestinal Normal   Gastrointestinal details normal Soft   EXTR negative cyanosis/clubbing, negative edema   Lab Results: Routine Chem:  04-Aug-15 04:56   Glucose, Serum  121  BUN 11  Creatinine (comp)  1.36  Sodium, Serum 141  Potassium, Serum 3.6  Chloride, Serum 102  CO2, Serum 30  Calcium (Total), Serum 8.9  Anion Gap 9  Osmolality (calc) 282  eGFR (African American) >60  eGFR (Non-African American)  53 (eGFR values <18m/min/1.73 m2 may be an indication of chronic kidney disease (CKD). Calculated eGFR is useful in patients with stable renal function. The eGFR calculation will not be reliable in acutely ill patients when serum creatinine is changing rapidly. It is not useful in  patients on dialysis. The eGFR calculation may not be applicable to patients at the low and high extremes of body sizes, pregnant women, and vegetarians.)  Routine Hem:  04-Aug-15 04:56   WBC (CBC) 7.8  RBC (CBC)  3.84  Hemoglobin (CBC)  12.0  Hematocrit (CBC)  35.7  Platelet Count (CBC)  134  MCV 93  MCH 31.3  MCHC 33.7  RDW 14.2  Neutrophil % 65.1  Lymphocyte % 17.0  Monocyte % 10.6  Eosinophil % 6.8  Basophil % 0.5   Neutrophil # 5.1  Lymphocyte # 1.3  Monocyte # 0.8  Eosinophil # 0.5  Basophil # 0.0 (Result(s) reported on 06 Jan 2014 at 05:48AM.)   Radiology Results: XRay:    03-Aug-15 06:40, Chest PA and Lateral  Chest PA and Lateral   REASON FOR EXAM:    cp  COMMENTS:       PROCEDURE: DXR - DXR CHEST PA (OR AP) AND LATERAL  - Jan 05 2014  6:40AM     CLINICAL DATA:  Chest pain for 1 day.    EXAM:  CHEST  2 VIEW    COMPARISON:  06/29/2013    FINDINGS:  Stable mild cardiomegaly, accentuated by mediastinal fat. Status  post CABG. There is chronic fracturing of multiple sternal wires.  Unchanged cerclage wire alignment. Unchanged upper mediastinal  contours.    There is no edema, consolidation, effusion, or pneumothorax. Chronic  pleural or extrapleural thickening along mid left chest wall, likely  scar.     IMPRESSION:  No active cardiopulmonary disease.      Electronically Signed    By: JJorje GuildM.D.    On: 01/05/2014 06:56     Verified By: JGilford Silvius M.D.,  Cardiology:    03-Aug-15 05:53, ED ECG  Ventricular Rate 115  Atrial Rate 115  P-R Interval 144  QRS Duration 106  QT 328  QTc 453  P Axis -20  R Axis 54  T Axis 26  ECG interpretation   Sinus tachycardia  Septal infarct , age undetermined  Abnormal ECG  When compared with ECG of 30-Dec-2013 14:41,  No significant change was found  ----------unconfirmed----------  Confirmed by OVERREAD, NOT ([id number]), editor PEARSON, BARBARA ([id number]) on 01/05/2014 2:14:39 PM  ED ECG     03-Aug-15 14:12, Echo Doppler  Echo Doppler   REASON FOR EXAM:      COMMENTS:       PROCEDURE: [facility] - ECHO DOPPLER COMPLETE(TRANSTHOR)  - Jan 05 2014  2:12PM     RESULT: Echocardiogram Report    Patient Name:   Adam Merritt Date of Exam: 01/05/2014  Medical Rec #:  [id number]        Custom1:  Date of Birth:  12/29/1945     Height:       66.0 in  Patient Age:    69 years      Weight:       351.0 lb  Patient Gender: M             BSA:           2.54 m??    Indications: Angina  Sonographer:    Sherrie Sport RDCS  Referring Phys: Gladstone Lighter    Sonographer Comments: Technically very difficult study due to very poor   echo windows.    Summary:   1. Left ventricular ejection fraction, by visual estimation, is 50 to   55%.   2. Low normal global left ventricular systolic function.   3. Borderline left ventricular hypertrophy.   4. Mildly increased left ventricular posterior wall thickness.  2D AND M-MODE MEASUREMENTS (normal ranges within parentheses):  Left Ventricle:          Normal  IVSd (2D):      1.37 cm (0.7-1.1)  LVPWd (2D):     1.15 cm (0.7-1.1) Aorta/LA:                  Normal  LVIDd (2D):     3.60 cm (3.4-5.7) Aortic Root (2D): 2.20 cm (2.4-3.7)  LVIDs (2D):     2.65 cm           Left Atrium (2D): 3.90 cm (1.9-4.0)  LV FS (2D):     26.4 %   (>25%)  LV EF (2D):     52.6 %   (>50%)                                    Right Ventricle:                                    RVd (2D):  SPECTRAL DOPPLER ANALYSIS (where applicable):  LVOT Vmax:  LVOT VTI:  LVOT Diameter: 2.20 cm    PHYSICIAN INTERPRETATION:  Left Ventricle: Not well seen.The left ventricular internal cavity size   was normal. LV septal wall thickness was normal. LV posterior wall   thickness was mildly increased. Borderline left ventricular hypertrophy.   Global LV systolic function was low normal. Left ventricular ejection   fraction, by visual estimation, is 50 to 55%.  Right Ventricle: The right ventricle was not well seen. The right   ventricular size  is normal. Global RV systolic function is normal.  Left Atrium: The left atrium is normal in size.  Right Atrium: The right atrium was not well visualized. The right atrium   is normal in size.  Pericardium: There is no evidence of pericardial effusion.  Mitral Valve: The mitral valve is not well seen. The mitral valve is   normal in structure. Trace mitral valve regurgitation is  seen.  Tricuspid Valve: The tricuspid valve is normal. Trivial tricuspid   regurgitation is visualized.  Aortic Valve: The aortic valve is normal.  Pulmonic Valve: The pulmonic valve is normal.    Lincoln MD  Electronically signed by Trego MD  Signature Date/Time: 01/05/2014/5:34:32 PM    *** Final ***    IMPRESSION: .        Verified By: Yolonda Kida, M.D., MD   Assessment/Plan:  Assessment/Plan:  Assessment IMP [place] Obesity  OSA CAD HTN DM .   Plan PLAN Rec cath before d/c but pt refuses Ptr also co an not tolerate myoview because of clastophobia Rec wgt loss Continue asa and meds for CAD Statin therapy for lipids Agree witth DM control Continue htn control F/U with BK 1-2 weeks   Electronic Signatures: Lujean Amel D (MD)  (Signed 04-Aug-15 23:11)  Authored: Chief Complaint, VITAL SIGNS/ANCILLARY NOTES, Brief Assessment, Lab Results, Radiology Results, Assessment/Plan   Last Updated: 04-Aug-15 23:11 by Lujean Amel D (MD)

## 2014-09-28 ENCOUNTER — Other Ambulatory Visit
Admit: 2014-09-28 | Disposition: A | Discharge status: Home / Self Care | Payer: Self-pay | Attending: Nurse Practitioner | Admitting: Nurse Practitioner

## 2014-09-28 DIAGNOSIS — R0602 Shortness of breath: Secondary | ICD-10-CM | POA: Diagnosis not present

## 2014-09-28 DIAGNOSIS — R4182 Altered mental status, unspecified: Secondary | ICD-10-CM | POA: Diagnosis not present

## 2014-09-28 DIAGNOSIS — J449 Chronic obstructive pulmonary disease, unspecified: Secondary | ICD-10-CM | POA: Diagnosis not present

## 2014-09-28 DIAGNOSIS — E1165 Type 2 diabetes mellitus with hyperglycemia: Secondary | ICD-10-CM | POA: Diagnosis not present

## 2014-09-28 DIAGNOSIS — R5383 Other fatigue: Secondary | ICD-10-CM | POA: Diagnosis not present

## 2014-09-28 DIAGNOSIS — I25791 Atherosclerosis of other coronary artery bypass graft(s) with angina pectoris with documented spasm: Secondary | ICD-10-CM | POA: Diagnosis not present

## 2014-09-28 DIAGNOSIS — D510 Vitamin B12 deficiency anemia due to intrinsic factor deficiency: Secondary | ICD-10-CM | POA: Diagnosis not present

## 2014-09-28 DIAGNOSIS — G4730 Sleep apnea, unspecified: Secondary | ICD-10-CM | POA: Diagnosis not present

## 2014-09-28 DIAGNOSIS — D509 Iron deficiency anemia, unspecified: Secondary | ICD-10-CM | POA: Diagnosis not present

## 2014-09-28 DIAGNOSIS — I10 Essential (primary) hypertension: Secondary | ICD-10-CM | POA: Diagnosis not present

## 2014-09-28 DIAGNOSIS — J069 Acute upper respiratory infection, unspecified: Secondary | ICD-10-CM | POA: Diagnosis not present

## 2014-09-28 LAB — FOLATE: FOLIC ACID: 29 ng/mL

## 2014-09-28 LAB — CBC WITH DIFFERENTIAL/PLATELET
Basophil #: 0 10*3/uL (ref 0.0–0.1)
Basophil %: 0.5 %
Eosinophil #: 0.3 10*3/uL (ref 0.0–0.7)
Eosinophil %: 3.7 %
HCT: 40.9 % (ref 40.0–52.0)
HGB: 13.8 g/dL (ref 13.0–18.0)
Lymphocyte #: 1.7 10*3/uL (ref 1.0–3.6)
Lymphocyte %: 18.9 %
MCH: 31.1 pg (ref 26.0–34.0)
MCHC: 33.8 g/dL (ref 32.0–36.0)
MCV: 92 fL (ref 80–100)
MONOS PCT: 7.3 %
Monocyte #: 0.6 x10 3/mm (ref 0.2–1.0)
Neutrophil #: 6.1 10*3/uL (ref 1.4–6.5)
Neutrophil %: 69.6 %
Platelet: 142 10*3/uL — ABNORMAL LOW (ref 150–440)
RBC: 4.44 10*6/uL (ref 4.40–5.90)
RDW: 13.5 % (ref 11.5–14.5)
WBC: 8.8 10*3/uL (ref 3.8–10.6)

## 2014-09-28 LAB — COMPREHENSIVE METABOLIC PANEL
AST: 27 U/L
Albumin: 3.6 g/dL
Alkaline Phosphatase: 88 U/L
Anion Gap: 8 (ref 7–16)
BUN: 12 mg/dL
Bilirubin,Total: 0.6 mg/dL
CHLORIDE: 103 mmol/L
Calcium, Total: 8.5 mg/dL — ABNORMAL LOW
Co2: 27 mmol/L
Creatinine: 1.41 mg/dL — ABNORMAL HIGH
EGFR (African American): 58 — ABNORMAL LOW
GFR CALC NON AF AMER: 50 — AB
Glucose: 149 mg/dL — ABNORMAL HIGH
Potassium: 3.5 mmol/L
SGPT (ALT): 22 U/L
Sodium: 138 mmol/L
Total Protein: 6.8 g/dL

## 2014-09-28 LAB — FERRITIN: FERRITIN (ARMC): 35 ng/mL

## 2014-10-01 DIAGNOSIS — E119 Type 2 diabetes mellitus without complications: Secondary | ICD-10-CM | POA: Diagnosis not present

## 2014-10-05 ENCOUNTER — Encounter (HOSPITAL_COMMUNITY): Payer: Medicare Other

## 2014-10-05 ENCOUNTER — Ambulatory Visit: Payer: Medicare Other | Admitting: Family

## 2014-10-05 ENCOUNTER — Other Ambulatory Visit (HOSPITAL_COMMUNITY): Payer: Medicare Other

## 2014-10-05 DIAGNOSIS — J449 Chronic obstructive pulmonary disease, unspecified: Secondary | ICD-10-CM | POA: Diagnosis not present

## 2014-10-15 DIAGNOSIS — N390 Urinary tract infection, site not specified: Secondary | ICD-10-CM | POA: Diagnosis not present

## 2014-11-06 DIAGNOSIS — I10 Essential (primary) hypertension: Secondary | ICD-10-CM | POA: Insufficient documentation

## 2014-11-09 DIAGNOSIS — L03119 Cellulitis of unspecified part of limb: Secondary | ICD-10-CM | POA: Diagnosis not present

## 2014-11-17 ENCOUNTER — Encounter: Payer: Medicare Other | Attending: Surgery | Admitting: Surgery

## 2014-11-17 DIAGNOSIS — E11622 Type 2 diabetes mellitus with other skin ulcer: Secondary | ICD-10-CM | POA: Insufficient documentation

## 2014-11-17 DIAGNOSIS — J449 Chronic obstructive pulmonary disease, unspecified: Secondary | ICD-10-CM | POA: Diagnosis not present

## 2014-11-17 DIAGNOSIS — X58XXXA Exposure to other specified factors, initial encounter: Secondary | ICD-10-CM | POA: Diagnosis not present

## 2014-11-17 DIAGNOSIS — I509 Heart failure, unspecified: Secondary | ICD-10-CM | POA: Diagnosis not present

## 2014-11-17 DIAGNOSIS — E11610 Type 2 diabetes mellitus with diabetic neuropathic arthropathy: Secondary | ICD-10-CM | POA: Diagnosis not present

## 2014-11-17 DIAGNOSIS — I10 Essential (primary) hypertension: Secondary | ICD-10-CM | POA: Insufficient documentation

## 2014-11-17 DIAGNOSIS — I890 Lymphedema, not elsewhere classified: Secondary | ICD-10-CM | POA: Diagnosis not present

## 2014-11-17 DIAGNOSIS — I2510 Atherosclerotic heart disease of native coronary artery without angina pectoris: Secondary | ICD-10-CM | POA: Insufficient documentation

## 2014-11-17 DIAGNOSIS — E0840 Diabetes mellitus due to underlying condition with diabetic neuropathy, unspecified: Secondary | ICD-10-CM | POA: Diagnosis not present

## 2014-11-17 DIAGNOSIS — G4730 Sleep apnea, unspecified: Secondary | ICD-10-CM | POA: Diagnosis not present

## 2014-11-17 DIAGNOSIS — S81801A Unspecified open wound, right lower leg, initial encounter: Secondary | ICD-10-CM | POA: Diagnosis not present

## 2014-11-17 DIAGNOSIS — L97811 Non-pressure chronic ulcer of other part of right lower leg limited to breakdown of skin: Secondary | ICD-10-CM | POA: Diagnosis not present

## 2014-11-18 NOTE — Progress Notes (Signed)
ROMULUS, CRAFTS ([id number]) Visit Report for 11/17/2014 Abuse/Suicide Risk Screen Details Patient Name: Adam Merritt, Adam Merritt Date of Service: 11/17/2014 9:00 AM Medical Record Number: [id number] Patient Account Number: [id number] Date of Birth/Sex: 1945-07-12 (69 y.o. Male) Treating RN: Montey Hora Primary Care Physician: [name] Other Clinician: Referring Physician: [name] Treating Physician/Extender: Frann Rider in Treatment: 0 Abuse/Suicide Risk Screen Items Answer ABUSE/SUICIDE RISK SCREEN: Has anyone close to you tried to hurt or harm you recentlyo No Do you feel uncomfortable with anyone in your familyo No Has anyone forced you do things that you didnot want to doo No Do you have any thoughts of harming yourselfo No Patient displays signs or symptoms of abuse and/or neglect. No Electronic Signature(s) Signed: 11/17/2014 5:02:20 PM By: Montey Hora Entered By: Montey Hora on 11/17/2014 09:33:48 Adam Merritt ([id number]) -------------------------------------------------------------------------------- Activities of Daily Living Details Patient Name: Adam Merritt, Adam Merritt Date of Service: 11/17/2014 9:00 AM Medical Record Number: [id number] Patient Account Number: [id number] Date of Birth/Sex: [birth year] (69 y.o. Male) Treating RN: Montey Hora Primary Care Physician: [name] Other Clinician: Referring Physician: [name] Treating Physician/Extender: Frann Rider in Treatment: 0 Activities of Daily Living Items Answer Activities of Daily Living (Please select one for each item) Drive Automobile Need Assistance Take Medications Completely Able Use Telephone Completely Able Care for Appearance Completely Able Use Toilet Completely Able Bath / Shower Need Assistance Dress Self Completely Able Feed Self Completely Able Walk Need Assistance Get In / Out Bed Completely Able Housework Need Assistance Prepare Meals Completely [place] for Self Need Assistance Electronic Signature(s) Signed: 11/17/2014 5:02:20 PM By: Montey Hora Entered By: Montey Hora on 11/17/2014 09:34:33 Adam Merritt ([id number]) -------------------------------------------------------------------------------- Education Assessment Details Patient Name: Adam Merritt Date of Service: 11/17/2014 9:00 AM Medical Record Number: [id number] Patient Account Number: [id number] Date of Birth/Sex: 11-21-1945 (69 y.o. Male) Treating RN: Montey Hora Primary Care Physician: [name] Other Clinician: Referring Physician: [name] Treating Physician/Extender: Frann Rider in Treatment: 0 Primary Learner Assessed: Patient Learning Preferences/Education Level/Primary Language Learning Preference: Explanation, Demonstration, Printed Material Highest Education Level: High School Preferred Language: English Cognitive Barrier Assessment/Beliefs Language Barrier: No Translator Needed: No Memory Deficit: No Emotional Barrier: No Cultural/Religious Beliefs Affecting Medical No Care: Physical Barrier Assessment Impaired Vision: Yes right eye lazy Impaired Hearing: No Decreased Hand dexterity: No Knowledge/Comprehension Assessment Knowledge Level: Medium Comprehension Level: Medium Ability to understand written Medium instructions: Ability to understand verbal Medium instructions: Motivation Assessment Anxiety Level: Calm Cooperation: Cooperative Education Importance: Acknowledges Need Interest in Health Problems: Asks Questions Perception: Coherent Willingness to Engage in Self- Medium Management Activities: Readiness to Engage in Self- Medium Management Activities: Electronic Signature(s) Adam Merritt, Adam Merritt ([id number]) Signed: 11/17/2014 5:02:20 PM By: Montey Hora Entered By: Montey Hora on 11/17/2014 09:39:30 Adam Merritt, Adam Merritt  ([id number]) -------------------------------------------------------------------------------- Fall Risk Assessment Details Patient Name: Adam Merritt Date of Service: 11/17/2014 9:00 AM Medical Record Number: [id number] Patient Account Number: [id number] Date of Birth/Sex: 1945/09/17 (69 y.o. Male) Treating RN: Montey Hora Primary Care Physician: [name] Other Clinician: Referring Physician: [name] Treating Physician/Extender: Frann Rider in Treatment: 0 Fall Risk Assessment Items FALL RISK ASSESSMENT: History of falling - immediate or within 3 months 25 Yes Secondary diagnosis 0 No Ambulatory aid None/bed rest/wheelchair/nurse 0 No Crutches/cane/walker 15 Yes Furniture 0 No IV Access/Saline Lock 0 No Gait/Training Normal/bed rest/immobile 0 No Weak 10 Yes Impaired 0 No Mental Status Oriented to own ability 0 Yes  Electronic Signature(s) Signed: 11/17/2014 5:02:20 PM By: Montey Hora Entered By: Montey Hora on 11/17/2014 09:35:04 Adam Merritt ([id number]) -------------------------------------------------------------------------------- Foot Assessment Details Patient Name: Adam Merritt Date of Service: 11/17/2014 9:00 AM Medical Record Number: [id number] Patient Account Number: [id number] Date of Birth/Sex: [birth year] (69 y.o. Male) Treating RN: Montey Hora Primary Care Physician: [name] Other Clinician: Referring Physician: [name] Treating Physician/Extender: Frann Rider in Treatment: 0 Foot Assessment Items Site Locations + = Sensation present, - = Sensation absent, C = Callus, U = Ulcer R = Redness, W = Warmth, M = Maceration, PU = Pre-ulcerative lesion F = Fissure, S = Swelling, D = Dryness Assessment Right: Left: Other Deformity: No No Prior Foot Ulcer: No No Prior Amputation: No No Charcot Joint: No No Ambulatory Status: Ambulatory With Help Assistance Device: Cane Gait: Steady Electronic Signature(s) Signed:  11/17/2014 5:02:20 PM By: Montey Hora Entered By: Montey Hora on 11/17/2014 10:13:32 Adam Merritt ([id number]) -------------------------------------------------------------------------------- Nutrition Risk Assessment Details Patient Name: Adam Merritt Date of Service: 11/17/2014 9:00 AM Medical Record Number: [id number] Patient Account Number: [id number] Date of Birth/Sex: 1945/10/05 (69 y.o. Male) Treating RN: Montey Hora Primary Care Physician: [name] Other Clinician: Referring Physician: [name] Treating Physician/Extender: Frann Rider in Treatment: 0 Height (in): 65 Weight (lbs): 342 Body Mass Index (BMI): 56.9 Nutrition Risk Assessment Items NUTRITION RISK SCREEN: I have an illness or condition that made me change the kind and/or 0 No amount of food I eat I eat fewer than two meals per day 0 No I eat few fruits and vegetables, or milk products 0 No I have three or more drinks of beer, liquor or wine almost every day 0 No I have tooth or mouth problems that make it hard for me to eat 0 No I don't always have enough money to buy the food I need 0 No I eat alone most of the time 0 No I take three or more different prescribed or over-the-counter drugs a 1 Yes day Without wanting to, I have lost or gained 10 pounds in the last six 0 No months I am not always physically able to shop, cook and/or feed myself 0 No Nutrition Protocols Good Risk Protocol Moderate Risk Protocol Electronic Signature(s) Signed: 11/17/2014 5:02:20 PM By: Montey Hora Entered By: Montey Hora on 11/17/2014 09:35:36

## 2014-11-18 NOTE — Progress Notes (Signed)
[name], GOTTSHALL ([id number]) Visit Report for 11/17/2014 Chief Complaint Document Details Patient Name: Adam Merritt, Adam Merritt Date of Service: 11/17/2014 9:00 AM Medical Record Number: [id number] Patient Account Number: [id number] Date of Birth/Sex: 01-Jan-1946 (69 y.o. Male) Treating RN: Primary Care Physician: [name] Other Clinician: Referring Physician: [name] Treating Physician/Extender: Frann Rider in Treatment: 0 Information Obtained from: Patient Chief Complaint Patient presents to the wound care center for a consult due non healing wound. 69 year old gentleman who comes with bilateral lower limb edema for over 20 years and recent attack of blisters and redness for about 2 weeks. Electronic Signature(s) Signed: 11/17/2014 12:20:02 PM By: Christin Fudge MD, FACS Entered By: Christin Fudge on 11/17/2014 10:33:50 Adam Merritt ([id number]) -------------------------------------------------------------------------------- HPI Details Patient Name: Adam Merritt Date of Service: 11/17/2014 9:00 AM Medical Record Number: [id number] Patient Account Number: [id number] Date of Birth/Sex: Jul 04, 1945 (69 y.o. Male) Treating RN: Primary Care Physician: [name] Other Clinician: Referring Physician: [name] Treating Physician/Extender: Frann Rider in Treatment: 0 History of Present Illness Location: bilateral lower leg edema with some blisters recently. Quality: Patient reports experiencing a dull pain to affected area(s). Severity: Patient states wound (s) are getting better. Duration: Patient has had the wound for < 2 weeks prior to presenting for treatment Timing: Pain in wound is Intermittent (comes and goes Context: The wound appeared gradually over time Modifying Factors: Consults to this date include: he has been seen by his PCP and started on Keflex recently. Associated Signs and Symptoms: Patient reports having difficulty standing for long periods. HPI  Description: The patient says that he may have scratched some blisters and started getting a infection and he is now better after his PCP put him on Keflex for 2 weeks. he has had bilateral lower limb edema since 1996. He has been noncompliant with wearing wraps in the past and even was given a boot to wear at night which she never tolerated and didn't wear it. He has a past medical history of sleep apnea, neuropathy with diabetes, hypertension, hyperlipidemia, coronary artery disease, varicose veins, carotid artery disease and is status post CABG and cholecystectomy. He was seen by the vascular surgeon on 09/29/2013 and Dr. Trula Slade opinion was:"I have reviewed his outside ultrasound which shows an ankle-brachial index of 0.72 on the left and 1.0 on the right.o This study was repeated at our office.o The ankle-brachial index on the right was 1.26 with triphasic waveforms.o On the left it was a 1.04 with biphasic waveforms.o The patient had a toe pressure of 121 on the right and 100 on the left. In summary, I would not recommend any further imaging , as I do not think his symptoms are related to arterial insufficiency, but rather secondary to diabetic neuropathy.o The patient still has room for increasing his Neurontin dose, which he states does help him.o I have scheduled him to come back in one year for a repeat arterial evaluation." Electronic Signature(s) Signed: 11/17/2014 12:20:02 PM By: Christin Fudge MD, FACS Entered By: Christin Fudge on 11/17/2014 10:36:09 Adam Merritt ([id number]) -------------------------------------------------------------------------------- Physical Exam Details Patient Name: Adam Merritt Date of Service: 11/17/2014 9:00 AM Medical Record Number: [id number] Patient Account Number: [id number] Date of Birth/Sex: 14-Feb-1946 (69 y.o. Male) Treating RN: Primary Care Physician: [name] Other Clinician: Referring Physician: [name] Treating  Physician/Extender: Frann Rider in Treatment: 0 Constitutional . Pulse regular. Respirations normal and unlabored. Afebrile. . Eyes Nonicteric. Reactive to light. Ears, Nose, Mouth, and Throat Lips, teeth,  and gums WNL.Marland Kitchen Moist mucosa without lesions . Neck supple and nontender. No palpable supraclavicular or cervical adenopathy. Normal sized without goiter. Respiratory WNL. No retractions.. Cardiovascular Pedal Pulses WNL. the pedal pulses and they were noncompressible and ABI could not be measured.. he has stage II lymphedema bilateral lower legs left worse than right.. Integumentary (Hair, Skin) small ulcerated area on the right lower extremity.. he has significant thickening of both lower extremities but the left is worse than the right and is colored dermal thickening and changes of hyperkeratosis.Marland Kitchen Psychiatric Judgement and insight Intact.. No evidence of depression, anxiety, or agitation.. Electronic Signature(s) Signed: 11/17/2014 12:20:02 PM By: Christin Fudge MD, FACS Entered By: Christin Fudge on 11/17/2014 10:38:41 Adam Merritt ([id number]) -------------------------------------------------------------------------------- Physician Orders Details Patient Name: Adam Merritt Date of Service: 11/17/2014 9:00 AM Medical Record Number: [id number] Patient Account Number: [id number] Date of Birth/Sex: June 28, 1945 (69 y.o. 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 Anesthetic Wound #1 Right,Posterior Lower Leg o Topical Lidocaine 4% cream applied to wound bed prior to debridement Primary Wound Dressing Wound #1 Right,Posterior Lower Leg o Aquacel Ag Dressing Change Frequency Wound #1 Right,Posterior Lower Leg o Change dressing every week o Other: - RN visit Friday if needed Follow-up Appointments Wound #1 Right,Posterior Lower Leg o Return Appointment in 1 week. o Nurse Visit as needed - Friday Edema Control o 2 Layer Lite Compression System - Right Lower Extremity Medications-please add to medication list. Wound #1 Right,Posterior Lower Leg o P.O. Antibiotics - Continue antibiotics prescribed by PCP Adam Merritt ([id number]) Services and Therapies o Venous Studies -Bilateral oooo Electronic Signature(s) Signed: 11/17/2014 12:20:02 PM By: Christin Fudge MD, FACS Signed: 11/17/2014 5:14:43 PM By: Junious Dresser RN Entered By: Junious Dresser on 11/17/2014 10:29:33 Adam Merritt ([id number]) -------------------------------------------------------------------------------- Problem List Details Patient Name: Adam Merritt Date of Service: 11/17/2014 9:00 AM Medical Record Number: [id number] Patient Account Number: [id number] Date of Birth/Sex: 1945-10-13 (69 y.o. Male) Treating RN: Primary Care Physician: [name] Other Clinician: Referring Physician: [name] Treating Physician/Extender: Frann Rider in Treatment: 0 Active Problems ICD-10 Encounter Code Description Active Date Diagnosis E11.610 Type 2 diabetes mellitus with diabetic neuropathic 11/17/2014 Yes arthropathy E08.40 Diabetes mellitus due to underlying condition with diabetic 11/17/2014 Yes neuropathy, unspecified I89.0 Lymphedema, not elsewhere classified 11/17/2014 Yes E66.01 Morbid (severe) obesity due to excess calories 11/17/2014  Yes Inactive Problems Resolved Problems Electronic Signature(s) Signed: 11/17/2014 12:20:02 PM By: Christin Fudge MD, FACS Entered By: Christin Fudge on 11/17/2014 10:33:07 Adam Merritt ([id number]) -------------------------------------------------------------------------------- Progress Note Details Patient Name: Adam Merritt Date of Service: 11/17/2014 9:00 AM Medical Record Number: [id number] Patient Account Number: [id number] Date of Birth/Sex: 10/25/1945 (69 y.o. Male) Treating RN: Primary Care Physician: [name] Other Clinician: Referring Physician: [name] Treating Physician/Extender: Frann Rider in Treatment: 0 Subjective Chief Complaint Information obtained from Patient Patient presents to the wound care center for a consult due non healing wound. 69 year old gentleman who comes with bilateral lower limb edema for over 20 years  and recent attack of blisters and redness for about 2 weeks. History of Present Illness (HPI) The following HPI elements were documented for the patient's wound: Location: bilateral lower leg edema with some blisters recently. Quality: Patient reports experiencing a dull pain to affected area(s). Severity: Patient states wound (s) are getting better. Duration: Patient has had the wound for < 2 weeks prior to presenting for treatment Timing: Pain in wound is Intermittent (comes and goes Context: The wound appeared gradually over time Modifying Factors: Consults to this date include: he has been seen by his PCP and started on Keflex recently. Associated Signs and Symptoms: Patient reports having difficulty standing for long periods. The patient says that he may have scratched some blisters and started getting a infection and he is now better after his PCP put him on Keflex for 2 weeks. he has had bilateral lower limb edema since 1996. He has been noncompliant with wearing wraps in the past and even was given a boot to wear at night  which she never tolerated and didn't wear it. He has a past medical history of sleep apnea, neuropathy with diabetes, hypertension, hyperlipidemia, coronary artery disease, varicose veins, carotid artery disease and is status post CABG and cholecystectomy. He was seen by the vascular surgeon on 09/29/2013 and Dr. Trula Slade opinion was:"I have reviewed his outside ultrasound which shows an ankle-brachial index of 0.72 on the left and 1.0 on the right. This study was repeated at our office. The ankle-brachial index on the right was 1.26 with triphasic waveforms. On the left it was a 1.04 with biphasic waveforms. The patient had a toe pressure of 121 on the right and 100 on the left. In summary, I would not recommend any further imaging , as I do not think his symptoms are related to arterial insufficiency, but rather secondary to diabetic neuropathy. The patient still has room for increasing his Neurontin dose, which he states does help him. I have scheduled him to come back in one year for a repeat arterial evaluation." I have reviewed some recent arterial studies from 2015 and also got his most recent hemoglobin A1c done on 09/28/2014 to be 7.5. ADONY, BURNINGHAM ([id number]) Patient History Allergies Corticosteroids (Glucocorticoids) Family History Cancer - Paternal Grandparents, Kidney Disease - Siblings, Lung Disease - Mother, No family history of Diabetes, Heart Disease, Hereditary Spherocytosis, Hypertension, Seizures, Stroke, Thyroid Problems, Tuberculosis. Social History Never smoker, Marital Status - Married, Alcohol Use - Never, Drug Use - No History, Caffeine Use - Never. Medical History Eyes Denies history of Cataracts, Glaucoma, Optic Neuritis Ear/Nose/Mouth/Throat Denies history of Chronic sinus problems/congestion, Middle ear problems Hematologic/Lymphatic Patient has history of Anemia - aplastic anemia, Lymphedema Denies history of Hemophilia, Human Immunodeficiency Virus,  Sickle Cell Disease Respiratory Patient has history of Chronic Obstructive Pulmonary Disease (COPD), Sleep Apnea Denies history of Aspiration, Asthma, Pneumothorax, Tuberculosis Cardiovascular Patient has history of Congestive Heart Failure, Coronary Artery Disease, Hypertension, Peripheral Venous Disease Denies history of Angina, Arrhythmia, Deep Vein Thrombosis, Hypotension, Myocardial Infarction, Peripheral Arterial Disease, Phlebitis, Vasculitis Gastrointestinal Denies history of Cirrhosis , Colitis, Crohn s, Hepatitis A, Hepatitis B, Hepatitis C Endocrine Patient has history of Type II Diabetes Denies history of Type I Diabetes Genitourinary Denies history of End Stage Renal Disease Immunological Denies history of Lupus Erythematosus, Raynaud s, Scleroderma Integumentary (Skin) Denies history of History of Burn, History of pressure wounds Musculoskeletal Patient has history of Osteoarthritis Denies history of Gout, Rheumatoid Arthritis, Osteomyelitis Neurologic Patient has history of Neuropathy Denies  history of Dementia, Quadriplegia, Paraplegia, Seizure Disorder Oncologic Patient has history of Received Chemotherapy - last dose 2006 Denies history of Received Radiation GAIL, BOTSCH ([id number]) Psychiatric Denies history of Anorexia/bulimia, Confinement Anxiety Patient is treated with Insulin. Blood sugar is tested. Hospitalization/Surgery History - 11/03/2013, [facility], bronchitis. Medical And Surgical History Notes Cardiovascular varicose veins Neurologic CVA - 1992 Review of Systems (ROS) Constitutional Symptoms (General Health) The patient has no complaints or symptoms. Eyes Complains or has symptoms of Vision Changes - stroke in eye. Denies complaints or symptoms of Dry Eyes, Glasses / Contacts. Ear/Nose/Mouth/Throat The patient has no complaints or symptoms. Hematologic/Lymphatic The patient has no complaints or symptoms. Respiratory Complains or has  symptoms of Shortness of Breath. Denies complaints or symptoms of Chronic or frequent coughs. Cardiovascular Complains or has symptoms of LE edema. Denies complaints or symptoms of Chest pain. Gastrointestinal The patient has no complaints or symptoms. Endocrine The patient has no complaints or symptoms. Genitourinary The patient has no complaints or symptoms. Immunological The patient has no complaints or symptoms. Integumentary (Skin) The patient has no complaints or symptoms. Musculoskeletal The patient has no complaints or symptoms. Neurologic The patient has no complaints or symptoms. Oncologic The patient has no complaints or symptoms. Psychiatric The patient has no complaints or symptoms. Medications: reviewed her list of medications and he is on DuoNeb, ferrous sulfate, furosemide, gabapentin, hydralazine, ibuprofen, Keflex, Lipitor, omeprazole, oxycodone, pentoxifylline, Singulair, Victoza, zolpidem. KEVANTE, NARDINI ([id number]) Objective Constitutional Pulse regular. Respirations normal and unlabored. Afebrile. Vitals Time Taken: 9:31 AM, Height: 65 in, Source: Stated, Weight: 342 lbs, Source: Stated, BMI: 56.9, Temperature: 97.9 F, Pulse: 80 bpm, Respiratory Rate: 22 breaths/min, Blood Pressure: 152/117 mmHg, Capillary Blood Glucose: 170 mg/dl. Eyes Nonicteric. Reactive to light. Ears, Nose, Mouth, and Throat Lips, teeth, and gums WNL.Marland Kitchen Moist mucosa without lesions . Neck supple and nontender. No palpable supraclavicular or cervical adenopathy. Normal sized without goiter. Respiratory WNL. No retractions.. Cardiovascular Pedal Pulses WNL. the pedal pulses and they were noncompressible and ABI could not be measured.. he has stage II lymphedema bilateral lower legs left worse than right.. Psychiatric Judgement and insight Intact.. No evidence of depression, anxiety, or agitation.. Integumentary (Hair, Skin) small ulcerated area on the right lower extremity..  he has significant thickening of both lower extremities but the left is worse than the right and is colored dermal thickening and changes of hyperkeratosis.. Wound #1 status is Open. Original cause of wound was Gradually Appeared. The wound is located on the Right,Posterior Lower Leg. The wound measures 0.7cm length x 0.4cm width x 0.1cm depth; 0.22cm^2 area and 0.022cm^3 volume. The wound is limited to skin breakdown. There is no tunneling or undermining noted. There is a none present amount of drainage noted. The wound margin is flat and intact. There is large (67-100%) red granulation within the wound bed. There is no necrotic tissue within the wound bed. The periwound skin appearance did not exhibit: Callus, Crepitus, Excoriation, Fluctuance, LAKIM, LEWANDOSKI. ([id number]) Induration, Localized Edema, Rash, Scarring, Dry/Scaly, Maceration, Moist, Atrophie Blanche, Cyanosis, Ecchymosis, Hemosiderin Staining, Mottled, Pallor, Rubor, Erythema. Periwound temperature was noted as No Abnormality. Assessment Active Problems ICD-10 E11.610 - Type 2 diabetes mellitus with diabetic neuropathic arthropathy E08.40 - Diabetes mellitus due to underlying condition with diabetic neuropathy, unspecified I89.0 - Lymphedema, not elsewhere classified E66.01 - Morbid (severe) obesity due to excess calories Diagnoses ICD-10 E11.610: Type 2 diabetes mellitus with diabetic neuropathic arthropathy E08.40: Diabetes mellitus due to underlying condition with diabetic neuropathy,  unspecified I89.0: Lymphedema, not elsewhere classified E66.01: Morbid (severe) obesity due to excess calories E11.622: Type 2 diabetes mellitus with other skin ulcer 69 year old gentleman has been worked up in the past both arterial and for possible DVTs of his legs. Vascular opinion was that most of his pain in his legs is due to neuropathy due to his diabetes and his lymphedema needed to treated with chronic compression and  lymphedema pumps but the patient was not compliant. at this stage he has agreed to wear a light compression on the right side and we will use some silver alginate and a 2 layer compression on the right lower extremity. I will also recommend venous Doppler studies to see if he has any significant varicose veins. I'm going to refer him to the lymphedema clinic at [facility] to see if they will accept his care and see if they can work with him. I was asked to come back and see me at regular intervals and he is agreeable about this. Plan Wound Cleansing: Wound #1 Right,Posterior Lower Leg: Clean wound with Normal Saline. Anesthetic: HAGOP, MAKELY ([id number]) Wound #1 Right,Posterior Lower Leg: Topical Lidocaine 4% cream applied to wound bed prior to debridement Primary Wound Dressing: Wound #1 Right,Posterior Lower Leg: Aquacel Ag Dressing Change Frequency: Wound #1 Right,Posterior Lower Leg: Change dressing every week Other: - RN visit Friday if needed Follow-up Appointments: Wound #1 Right,Posterior Lower Leg: Return Appointment in 1 week. Nurse Visit as needed - Friday Edema Control: 2 Layer Lite Compression System - Right Lower Extremity Medications-please add to medication list.: Wound #1 Right,Posterior Lower Leg: P.O. Antibiotics - Continue antibiotics prescribed by PCP Services and Therapies ordered were: Venous Studies -Bilateral Follow-Up Appointments: A follow-up appointment should be scheduled. A Patient Clinical Summary of Care was provided to BF 69 year old gentleman has been worked up in the past both arterial and for possible DVTs of his legs. Vascular opinion was that most of his pain in his legs is due to neuropathy due to his diabetes and his lymphedema needed to treated with chronic compression and lymphedema pumps but the patient was not compliant. at this stage he has agreed to wear a light compression on the right side and we will use  some silver alginate and a 2 layer compression on the right lower extremity. I will also recommend venous Doppler studies to see if he has any significant varicose veins. I'm going to refer him to the lymphedema clinic at [facility] to see if they will accept his care and see if they can work with him. I was asked to come back and see me at regular intervals and he is agreeable about this. Electronic Signature(s) Signed: 11/17/2014 5:08:26 PM By: Junious Dresser RN Signed: 11/18/2014 4:45:26 PM By: Christin Fudge MD, FACS Previous Signature: 11/17/2014 12:20:02 PM Version By: Christin Fudge MD, FACS LAQUENTIN, PREGLER ([id number]) Entered By: Junious Dresser on 11/17/2014 17:08:26 Adam Merritt ([id number]) -------------------------------------------------------------------------------- ROS/PFSH Details Patient Name: Adam Merritt Date of Service: 11/17/2014 9:00 AM Medical Record Number: [id number] Patient Account Number: [id number] Date of Birth/Sex: [birth year] (69 y.o. Male) Treating RN: Montey Hora Primary Care Physician: [name] Other Clinician: Referring Physician: [name] Treating Physician/Extender: Frann Rider in Treatment: 0 Wound History Eyes Complaints and Symptoms: Positive for: Vision Changes - stroke in eye Negative for: Dry Eyes; Glasses / Contacts Medical History: Negative for: Cataracts; Glaucoma; Optic Neuritis Respiratory Complaints and Symptoms: Positive for: Shortness of Breath Negative for:  Chronic or frequent coughs Medical History: Positive for: Chronic Obstructive Pulmonary Disease (COPD); Sleep Apnea Negative for: Aspiration; Asthma; Pneumothorax; Tuberculosis Cardiovascular Complaints and Symptoms: Positive for: LE edema Negative for: Chest pain Medical History: Positive for: Congestive Heart Failure; Coronary Artery Disease; Hypertension; Peripheral Venous Disease Negative for: Angina; Arrhythmia; Deep Vein  Thrombosis; Hypotension; Myocardial Infarction; Peripheral Arterial Disease; Phlebitis; Vasculitis Past Medical History Notes: varicose veins Constitutional Symptoms (General Health) Complaints and Symptoms: No Complaints or Symptoms Ear/Nose/Mouth/Throat Complaints and Symptoms: No Complaints or Symptoms LOVELLE, WESTLING ([id number]) Medical History: Negative for: Chronic sinus problems/congestion; Middle ear problems Hematologic/Lymphatic Complaints and Symptoms: No Complaints or Symptoms Medical History: Positive for: Anemia - aplastic anemia; Lymphedema Negative for: Hemophilia; Human Immunodeficiency Virus; Sickle Cell Disease Gastrointestinal Complaints and Symptoms: No Complaints or Symptoms Medical History: Negative for: Cirrhosis ; Colitis; Crohnos; Hepatitis A; Hepatitis B; Hepatitis C Endocrine Complaints and Symptoms: No Complaints or Symptoms Medical History: Positive for: Type II Diabetes Negative for: Type I Diabetes Time with diabetes: since 2006 Treated with: Insulin Blood sugar tested every day: Yes Tested : Genitourinary Complaints and Symptoms: No Complaints or Symptoms Medical History: Negative for: End Stage Renal Disease Immunological Complaints and Symptoms: No Complaints or Symptoms Medical History: Negative for: Lupus Erythematosus; Raynaudos; Scleroderma Integumentary (Skin) DUAINE, LOCURTO ([id number]) Complaints and Symptoms: No Complaints or Symptoms Medical History: Negative for: History of Burn; History of pressure wounds Musculoskeletal Complaints and Symptoms: No Complaints or Symptoms Medical History: Positive for: Osteoarthritis Negative for: Gout; Rheumatoid Arthritis; Osteomyelitis Neurologic Complaints and Symptoms: No Complaints or Symptoms Medical History: Positive for: Neuropathy Negative for: Dementia; Quadriplegia; Paraplegia; Seizure Disorder Past Medical History Notes: CVA - 1992 Oncologic Complaints and  Symptoms: No Complaints or Symptoms Medical History: Positive for: Received Chemotherapy - last dose 2006 Negative for: Received Radiation Psychiatric Complaints and Symptoms: No Complaints or Symptoms Medical History: Negative for: Anorexia/bulimia; Confinement Anxiety Hospitalization / Surgery History Name of Hospital Purpose of Hospitalization/Surgery Date [facility] bronchitis 11/03/2013 Family and Social History Cancer: Yes - Paternal Grandparents; Diabetes: No; Heart Disease: No; Hereditary Spherocytosis: No; Hypertension: No; Kidney Disease: Yes - Siblings; Lung Disease: Yes - Mother; Seizures: No; Stroke: No; Thyroid Problems: No; Tuberculosis: No; Never smoker; Marital Status - Married; Alcohol Use: Never; Drug VI, KANAN ([id number]) Use: No History; Caffeine Use: Never; Financial Concerns: No; Food, Clothing or Shelter Needs: No; Support System Lacking: No; Transportation Concerns: No; Advanced Directives: No; Patient does not want information on Advanced Directives Physician Affirmation I have reviewed and agree with the above information. Electronic Signature(s) Signed: 11/17/2014 12:20:02 PM By: Christin Fudge MD, FACS Signed: 11/17/2014 5:02:20 PM By: Montey Hora Entered By: Christin Fudge on 11/17/2014 10:10:41 Adam Merritt ([id number]) -------------------------------------------------------------------------------- SuperBill Details Patient Name: Adam Merritt Date of Service: 11/17/2014 Medical Record Number: [id number] Patient Account Number: [id number] Date of Birth/Sex: Oct 05, 1945 (69 y.o. 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 E08.40 Diabetes mellitus due to underlying condition with diabetic neuropathy,  unspecified I89.0 Lymphedema, not elsewhere classified E66.01 Morbid (severe) obesity due to excess calories E11.622 Type 2 diabetes mellitus with other skin ulcer Facility Procedures CPT4: Description Modifier Quantity Code [id number] - WOUND CARE VISIT-LEV 3 EST PT 1 CPT4: [id number] (Facility Use Only) [id number] - APPLY MULTLAY COMPRS LWR RT 1 LEG Physician Procedures CPT4: Description Modifier Quantity Code  [id number] - WC PHYS LEVEL 4 - NEW PT 1 ICD-10 Description Diagnosis 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

## 2014-11-18 NOTE — Progress Notes (Signed)
SERGI, PICKRON ([id number]) Visit Report for 11/17/2014 Allergy List Details Patient Name: Adam Merritt, Adam Merritt Date of Service: 11/17/2014 9:00 AM Medical Record Number: [id number] Patient Account Number: [id number] Date of Birth/Sex: Oct 09, 1945 (69 y.o. Male) Treating RN: Montey Hora Primary Care Physician: [name] Other Clinician: Referring Physician: [name] Treating Physician/Extender: Frann Rider in Treatment: 0 Allergies Active Allergies Corticosteroids (Glucocorticoids) Allergy Notes Electronic Signature(s) Signed: 11/17/2014 5:02:20 PM By: Montey Hora Entered By: Montey Hora on 11/17/2014 09:33:33 Adam Merritt ([id number]) -------------------------------------------------------------------------------- Arrival Information Details Patient Name: Adam Merritt Date of Service: 11/17/2014 9:00 AM Medical Record Number: [id number] Patient Account Number: [id number] Date of Birth/Sex: 06/14/1945 (69 y.o. Male) Treating RN: Montey Hora Primary Care Physician: [name] Other Clinician: Referring Physician: [name] Treating Physician/Extender: Frann Rider in Treatment: 0 Visit Information Patient Arrived: Cane Arrival Time: 09:30 Accompanied By: spouse Transfer Assistance: None Patient Identification Verified: Yes Secondary Verification Process Yes Completed: Patient Has Alerts: Yes Patient Alerts: Patient on Blood Thinner DMII asa 325 ABI [place] bilateral Electronic Signature(s) Signed: 11/17/2014 5:02:20 PM By: Montey Hora Entered By: Montey Hora on 11/17/2014 10:12:03 Adam Merritt ([id number]) -------------------------------------------------------------------------------- Clinic Level of Care Assessment Details Patient Name: Adam Merritt Date of Service: 11/17/2014 9:00 AM Medical Record Number: [id number] Patient Account Number: [id number] Date of Birth/Sex: May 17, 1946 (69 y.o. Male) Treating RN: Junious Dresser Primary  Care Physician: [name] Other Clinician: Referring Physician: [name] Treating Physician/Extender: Frann Rider in Treatment: 0 Clinic Level of Care Assessment Items TOOL 1 Quantity Score []  - Use when EandM and Procedure is performed on INITIAL visit 0 ASSESSMENTS - Nursing Assessment / Reassessment []  - General Physical Exam (combine w/ comprehensive assessment (listed just 0 below) when performed on new pt. evals) X - Comprehensive Assessment (HX, ROS, Risk Assessments, Wounds Hx, etc.) 1 25 ASSESSMENTS - Wound and Skin Assessment / Reassessment []  - Dermatologic / Skin Assessment (not related to wound area) 0 ASSESSMENTS - Ostomy and/or Continence Assessment and Care []  - Incontinence Assessment and Management 0 []  - Ostomy Care Assessment and Management (repouching, etc.) 0 PROCESS - Coordination of Care X - Simple Patient / Family Education for ongoing care 1 15 []  - Complex (extensive) Patient / Family Education for ongoing care 0 X - Staff obtains Programmer, systems, Records, Test Results / Process Orders 1 10 []  - Staff telephones HHA, Nursing Homes / Clarify orders / etc 0 []  - Routine Transfer to another Facility (non-emergent condition) 0 []  - Routine Hospital Admission (non-emergent condition) 0 X - New Admissions / Biomedical engineer / Ordering NPWT, Apligraf, etc. 1 15 []  - Emergency Hospital Admission (emergent condition) 0 PROCESS - Special Needs []  - Pediatric / Minor Patient Management 0 []  - Isolation Patient Management 0 Adam Merritt, Adam Merritt ([id number]) []  - Hearing / Language / Visual special needs 0 []  - Assessment of Community assistance (transportation, D/C planning, etc.) 0 []  - Additional assistance / Altered mentation 0 []  - Support Surface(s) Assessment (bed, cushion, seat, etc.) 0 INTERVENTIONS - Miscellaneous []  - External ear exam 0 []  - Patient Transfer (multiple staff / Civil Service fast streamer / Similar devices) 0 []  - Simple Staple / Suture removal  (25 or less) 0 []  - Complex Staple / Suture removal (26 or more) 0 []  - Hypo/Hyperglycemic Management (do not check if billed separately) 0 X - Ankle / Brachial Index (ABI) - do not check if billed separately 1 15 Has the patient been seen at the hospital within the  last three years: Yes Total Score: 80 Level Of Care: New/Established - Level 3 Electronic Signature(s) Signed: 11/17/2014 5:14:43 PM By: Junious Dresser RN Entered By: Junious Dresser on 11/17/2014 11:14:23 Adam Merritt ([id number]) -------------------------------------------------------------------------------- Encounter Discharge Information Details Patient Name: Adam Merritt Date of Service: 11/17/2014 9:00 AM Medical Record Number: [id number] Patient Account Number: [id number] Date of Birth/Sex: 23-Mar-1946 (69 y.o. Male) Treating RN: Primary Care Physician: [name] Other Clinician: Referring Physician: [name] Treating Physician/Extender: Frann Rider in Treatment: 0 Encounter Discharge Information Items Discharge Pain Level: 0 Discharge Condition: Stable Ambulatory Status: Ambulatory Discharge Destination: Home Transportation: Private Auto Accompanied By: spouse Schedule Follow-up Appointment: Yes Medication Reconciliation completed and provided to Patient/Care No Cassia Fein: Provided on Clinical Summary of Care: 11/17/2014 Form Type Recipient Paper Patient BF Electronic Signature(s) Signed: 11/17/2014 12:20:11 PM By: Montey Hora Previous Signature: 11/17/2014 10:44:23 AM Version By: Ruthine Dose Entered By: Montey Hora on 11/17/2014 12:20:11 Adam Merritt ([id number]) -------------------------------------------------------------------------------- Lower Extremity Assessment Details Patient Name: Adam Merritt Date of Service: 11/17/2014 9:00 AM Medical Record Number: [id number] Patient Account Number: [id number] Date of Birth/Sex: 1946/02/08 (69 y.o. Male) Treating RN: Montey Hora Primary Care Physician: [name] Other Clinician: Referring Physician: [name] Treating Physician/Extender: Frann Rider in Treatment: 0 Edema Assessment Assessed: [Left: No] [Right: No] E[Left: dema] [Right: :] Calf Left: Right: Point of Measurement: 34 cm From Medial Instep 63 cm 57 cm Ankle Left: Right: Point of Measurement: 10 cm From Medial Instep 38.4 cm 34 cm Vascular Assessment Pulses: Posterior Tibial Palpable: [Left:No] [Right:No] Doppler: [Left:Multiphasic] [Right:Multiphasic] Dorsalis Pedis Palpable: [Left:Yes] [Right:Yes] Doppler: [Left:Multiphasic] [Right:Multiphasic] Extremity colors, hair growth, and conditions: Extremity Color: [Left:Hyperpigmented] [Right:Hyperpigmented] Hair Growth on Extremity: [Left:No] [Right:No] Temperature of Extremity: [Left:Warm] [Right:Warm] Capillary Refill: [Left:< 3 seconds] [Right:< 3 seconds] Toe Nail Assessment Left: Right: Thick: Yes Yes Discolored: No No Deformed: No No Improper Length and Hygiene: No No Electronic Signature(s) Signed: 11/17/2014 5:02:20 PM By: Leonides Cave ([id number]) Entered By: Montey Hora on 11/17/2014 10:11:37 Adam Merritt ([id number]) -------------------------------------------------------------------------------- Multi Wound Chart Details Patient Name: Adam Merritt Date of Service: 11/17/2014 9:00 AM Medical Record Number: [id number] Patient Account Number: [id number] Date of Birth/Sex: 17-Apr-1946 (69 y.o. Male) Treating RN: Junious Dresser Primary Care Physician: [name] Other Clinician: Referring Physician: [name] Treating Physician/Extender: Frann Rider in Treatment: 0 Vital Signs Height(in): 65 Capillary Blood 170 Glucose(mg/dl): Weight(lbs): 342 Pulse(bpm): 80 Body Mass Index(BMI): 57 Blood Pressure Temperature(F): 97.9 152/117 (mmHg): Respiratory Rate 22 (breaths/min): Photos: [1:No Photos] [N/A:N/A] Wound  Location: [1:Right Lower Leg - Posterior] [N/A:N/A] Wounding Event: [1:Gradually Appeared] [N/A:N/A] Primary Etiology: [1:Lymphedema] [N/A:N/A] Comorbid History: [1:Anemia, Lymphedema, Chronic Obstructive Pulmonary Disease (COPD), Sleep Apnea, Congestive Heart Failure, Coronary Artery Disease, Hypertension, Peripheral Venous Disease, Type II Diabetes, Osteoarthritis, Neuropathy, Received  Chemotherapy] [N/A:N/A] Date Acquired: [1:10/26/2014] [N/A:N/A] Weeks of Treatment: [1:0] [N/A:N/A] Wound Status: [1:Open] [N/A:N/A] Measurements L x W x D 0.7x0.4x0.1 [N/A:N/A] (cm) Area (cm) : [1:0.22] [N/A:N/A] Volume (cm) : [1:0.022] [N/A:N/A] % Reduction in Area: [1:0.00%] [N/A:N/A] % Reduction in Volume: 0.00% [N/A:N/A] Classification: [1:Partial Thickness] [N/A:N/A] HBO Classification: [1:Grade 1] [N/A:N/A] Exudate Amount: [1:None Present] [N/A:N/A] Wound Margin: [1:Flat and Intact] [N/A:N/A] Granulation Amount: [1:Large (67-100%)] [N/A:N/A] Granulation Quality: Red N/A N/A Necrotic Amount: None Present (0%) N/A N/A Exposed Structures: Fascia: No N/A N/A Fat: No Tendon: No Muscle: No Joint: No Bone: No Limited to Skin Breakdown Epithelialization: None N/A N/A Periwound Skin Texture: Edema: No N/A N/A Excoriation: No Induration:  No Callus: No Crepitus: No Fluctuance: No Friable: No Rash: No Scarring: No Periwound Skin Maceration: No N/A N/A Moisture: Moist: No Dry/Scaly: No Periwound Skin Color: Atrophie Blanche: No N/A N/A Cyanosis: No Ecchymosis: No Erythema: No Hemosiderin Staining: No Mottled: No Pallor: No Rubor: No Temperature: No Abnormality N/A N/A Tenderness on No N/A N/A Palpation: Wound Preparation: Ulcer Cleansing: N/A N/A Rinsed/Irrigated with Saline Topical Anesthetic Applied: Other: lidocaine 4% Treatment Notes Electronic Signature(s) Signed: 11/17/2014 5:14:43 PM By: Junious Dresser RN Entered By: Junious Dresser on 11/17/2014 10:24:19 Adam Merritt ([id number]) -------------------------------------------------------------------------------- [place] Details Patient Name: Adam Merritt Date of Service: 11/17/2014 9:00 AM Medical Record Number: [id number] Patient Account Number: [id number] Date of Birth/Sex: June 08, 1945 (69 y.o. Male) Treating RN: Junious Dresser Primary Care Physician: [name] Other Clinician: Referring Physician: [name] Treating Physician/Extender: Frann Rider in Treatment: 0 Active Inactive Abuse / Safety / Falls / Self Care Management Nursing Diagnoses: Potential for falls Goals: Patient will remain injury free Date Initiated: 11/17/2014 Goal Status: Active Patient/caregiver will verbalize understanding of skin care regimen Date Initiated: 11/17/2014 Goal Status: Active Patient/caregiver will verbalize/demonstrate measures taken to prevent injury and/or falls Date Initiated: 11/17/2014 Goal Status: Active Patient/caregiver will verbalize/demonstrate understanding of what to do in case of emergency Date Initiated: 11/17/2014 Goal Status: Active Interventions: Assess fall risk on admission and as needed Assess: immobility, friction, shearing, incontinence upon admission and as needed Assess impairment of mobility on admission and as needed per policy Provide education on basic hygiene Provide education on fall prevention Notes: Orientation to the Wound Care Program Nursing Diagnoses: Knowledge deficit related to the wound healing center program Goals: Patient/caregiver will verbalize understanding of the [place] Date Initiated: 11/17/2014 Adam Merritt, Adam Merritt ([id number]) Goal Status: Active Interventions: Provide education on orientation to the wound center Notes: Venous Leg Ulcer Nursing Diagnoses: Knowledge deficit related to disease process and management Potential for venous Insuffiency (use before diagnosis  confirmed) Goals: Non-invasive venous studies are completed as ordered Date Initiated: 11/17/2014 Goal Status: Active Patient will maintain optimal edema control Date Initiated: 11/17/2014 Goal Status: Active Patient/caregiver will verbalize understanding of disease process and disease management Date Initiated: 11/17/2014 Goal Status: Active Verify adequate tissue perfusion prior to therapeutic compression application Date Initiated: 11/17/2014 Goal Status: Active Interventions: Assess peripheral edema status every visit. Compression as ordered Provide education on venous insufficiency Treatment Activities: Non-invasive vascular studies : 11/17/2014 Test ordered outside of clinic : 11/17/2014 Therapeutic compression applied : 11/17/2014 Notes: Wound/Skin Impairment Nursing Diagnoses: Impaired tissue integrity Knowledge deficit related to ulceration/compromised skin integrity Goals: Adam Merritt, Adam Merritt ([id number]) Patient/caregiver will verbalize understanding of skin care regimen Date Initiated: 11/17/2014 Goal Status: Active Ulcer/skin breakdown will heal within 14 weeks Date Initiated: 11/17/2014 Goal Status: Active Interventions: Assess patient/caregiver ability to obtain necessary supplies Assess patient/caregiver ability to perform ulcer/skin care regimen upon admission and as needed Assess ulceration(s) every visit Provide education on ulcer and skin care Treatment Activities: Skin care regimen initiated : 11/17/2014 Topical wound management initiated : 11/17/2014 Notes: Electronic Signature(s) Signed: 11/17/2014 5:14:43 PM By: Junious Dresser RN Entered By: Junious Dresser on 11/17/2014 10:24:01 Adam Merritt ([id number]) -------------------------------------------------------------------------------- Pain Assessment Details Patient Name: Adam Merritt Date of Service: 11/17/2014 9:00 AM Medical Record Number: [id number] Patient Account Number: [id number] Date of Birth/Sex:  05-29-1946 (69 y.o. Male) Treating RN: Montey Hora Primary Care Physician: [name] Other Clinician: Referring Physician: [name] Treating Physician/Extender: Frann Rider in Treatment: 0 Active Problems  Location of Pain Severity and Description of Pain Patient Has Paino Yes Site Locations Pain Location: Generalized Pain Pain Management and Medication Current Pain Management: Electronic Signature(s) Signed: 11/17/2014 5:02:20 PM By: Montey Hora Entered By: Montey Hora on 11/17/2014 10:12:17 Adam Merritt ([id number]) -------------------------------------------------------------------------------- Patient/Caregiver Education Details Patient Name: Adam Merritt Date of Service: 11/17/2014 9:00 AM Medical Record Number: [id number] Patient Account Number: [id number] Date of Birth/Gender: 11/09/1945 (69 y.o. Male) Treating RN: Montey Hora Primary Care Physician: [name] Other Clinician: Referring Physician: [name] Treating Physician/Extender: Frann Rider in Treatment: 0 Education Assessment Education Provided To: Patient and Caregiver Education Topics Provided Venous: Handouts: Other: compression wrap safety Methods: Demonstration, Explain/Verbal Responses: State content correctly Electronic Signature(s) Signed: 11/17/2014 12:20:39 PM By: Montey Hora Entered By: Montey Hora on 11/17/2014 12:20:39 Adam Merritt ([id number]) -------------------------------------------------------------------------------- Wound Assessment Details Patient Name: Adam Merritt Date of Service: 11/17/2014 9:00 AM Medical Record Number: [id number] Patient Account Number: [id number] Date of Birth/Sex: 19-Feb-1946 (69 y.o. Male) Treating RN: Montey Hora Primary Care Physician: [name] Other Clinician: Referring Physician: [name] Treating Physician/Extender: Frann Rider in Treatment: 0 Wound Status Wound Number: 1 Primary  Lymphedema Etiology: Wound Location: Right Lower Leg - Posterior Wound Open Wounding Event: Gradually Appeared Status: Date Acquired: 10/26/2014 Comorbid Anemia, Lymphedema, Chronic Weeks Of Treatment: 0 History: Obstructive Pulmonary Disease (COPD), Clustered Wound: No Sleep Apnea, Congestive Heart Failure, Coronary Artery Disease, Hypertension, Peripheral Venous Disease, Type II Diabetes, Osteoarthritis, Neuropathy, Received Chemotherapy Photos Photo Uploaded By: Montey Hora on 11/17/2014 12:16:05 Wound Measurements Length: (cm) 0.7 Width: (cm) 0.4 Depth: (cm) 0.1 Area: (cm) 0.22 Volume: (cm) 0.022 % Reduction in Area: 0% % Reduction in Volume: 0% Epithelialization: None Tunneling: No Undermining: No Wound Description Classification: Partial Thickness Foul Odor A Diabetic Severity ([place]): Grade 1 Wound Margin: Flat and Intact Exudate Amount: None Present fter Cleansing: No Wound Bed Granulation Amount: Large (67-100%) Exposed Structure Adam Merritt, Adam Merritt ([id number]) Granulation Quality: Red Fascia Exposed: No Necrotic Amount: None Present (0%) Fat Layer Exposed: No Tendon Exposed: No Muscle Exposed: No Joint Exposed: No Bone Exposed: No Limited to Skin Breakdown Periwound Skin Texture Texture Color No Abnormalities Noted: No No Abnormalities Noted: No Callus: No Atrophie Blanche: No Crepitus: No Cyanosis: No Excoriation: No Ecchymosis: No Fluctuance: No Erythema: No Friable: No Hemosiderin Staining: No Induration: No Mottled: No Localized Edema: No Pallor: No Rash: No Rubor: No Scarring: No Temperature / Pain Moisture Temperature: No Abnormality No Abnormalities Noted: No Dry / Scaly: No Maceration: No Moist: No Wound Preparation Ulcer Cleansing: Rinsed/Irrigated with Saline Topical Anesthetic Applied: Other: lidocaine 4%, Treatment Notes Wound #1 (Right, Posterior Lower Leg) 1. Cleansed with: Clean wound with Normal Saline 2.  Anesthetic Topical Lidocaine 4% cream to wound bed prior to debridement 4. Dressing Applied: Aquacel Ag 5. Secondary Dressing Applied ABD Pad 7. Secured with 2 Layer Lite Compression System - Right Lower Extremity Electronic Signature(s) Signed: 11/17/2014 5:02:20 PM By: Montey Hora Entered By: Montey Hora on 11/17/2014 10:03:36 Adam Merritt ([id number]) Adam Merritt, Adam Merritt ([id number]) -------------------------------------------------------------------------------- Vitals Details Patient Name: Adam Merritt Date of Service: 11/17/2014 9:00 AM Medical Record Number: [id number] Patient Account Number: [id number] Date of Birth/Sex: 1946/03/23 (69 y.o. Male) Treating RN: Montey Hora Primary Care Physician: [name] Other Clinician: Referring Physician: [name] Treating Physician/Extender: Frann Rider in Treatment: 0 Vital Signs Time Taken: 09:31 Temperature (F): 97.9 Height (in): 65 Pulse (bpm): 80 Source: Stated Respiratory Rate (breaths/min): 22 Weight (lbs): 342 Blood  Pressure (mmHg): 152/117 Source: Stated Capillary Blood Glucose (mg/dl): 170 Body Mass Index (BMI): 56.9 Reference Range: 80 - 120 mg / dl Electronic Signature(s) Signed: 11/17/2014 5:02:20 PM By: Montey Hora Entered By: Montey Hora on 11/17/2014 09:32:22

## 2014-11-19 DIAGNOSIS — L03119 Cellulitis of unspecified part of limb: Secondary | ICD-10-CM | POA: Diagnosis not present

## 2014-11-19 DIAGNOSIS — E11622 Type 2 diabetes mellitus with other skin ulcer: Secondary | ICD-10-CM | POA: Diagnosis not present

## 2014-11-19 DIAGNOSIS — I739 Peripheral vascular disease, unspecified: Secondary | ICD-10-CM | POA: Diagnosis not present

## 2014-11-19 DIAGNOSIS — S81801A Unspecified open wound, right lower leg, initial encounter: Secondary | ICD-10-CM | POA: Diagnosis not present

## 2014-11-19 DIAGNOSIS — E11610 Type 2 diabetes mellitus with diabetic neuropathic arthropathy: Secondary | ICD-10-CM | POA: Diagnosis not present

## 2014-11-19 DIAGNOSIS — M25579 Pain in unspecified ankle and joints of unspecified foot: Secondary | ICD-10-CM | POA: Diagnosis not present

## 2014-11-19 DIAGNOSIS — I10 Essential (primary) hypertension: Secondary | ICD-10-CM | POA: Diagnosis not present

## 2014-11-19 DIAGNOSIS — I890 Lymphedema, not elsewhere classified: Secondary | ICD-10-CM | POA: Diagnosis not present

## 2014-11-19 DIAGNOSIS — E1140 Type 2 diabetes mellitus with diabetic neuropathy, unspecified: Secondary | ICD-10-CM | POA: Diagnosis not present

## 2014-11-19 DIAGNOSIS — I509 Heart failure, unspecified: Secondary | ICD-10-CM | POA: Diagnosis not present

## 2014-11-19 NOTE — Progress Notes (Signed)
DONLEY, BIERMANN ([id number]) Visit Report for 11/19/2014 Arrival Information Details Patient Name: Adam Merritt, DAMP Date of Service: 11/19/2014 11:30 AM Medical Record Number: [id number] Patient Account Number: [id number] Date of Birth/Sex: 22-Mar-1946 (69 y.o. Male) Treating RN: Afful, RN, BSN, Velva Harman Primary Care Physician: [name] Other Clinician: Referring Physician: [name] Treating Physician/Extender: Frann Rider in Treatment: 0 Visit Information History Since Last Visit Any new allergies or adverse reactions: No Patient Arrived: [name] Had a fall or experienced change in No Arrival Time: 11:25 activities of daily living that may affect Accompanied By: wife risk of falls: Transfer Assistance: None Signs or symptoms of abuse/neglect since last No Patient Identification Verified: Yes visito Secondary Verification Process Yes Hospitalized since last visit: No Completed: Has Dressing in Place as Prescribed: Yes Patient Has Alerts: Yes Has Compression in Place as Prescribed: Yes Patient Alerts: Patient on Blood Pain Present Now: No Thinner DMII asa 325 ABI [place] bilateral Electronic Signature(s) Signed: 11/19/2014 4:04:56 PM By: Regan Lemming BSN, RN Entered By: Regan Lemming on 11/19/2014 11:37:08 Adam Merritt ([id number]) -------------------------------------------------------------------------------- Encounter Discharge Information Details Patient Name: Adam Merritt Date of Service: 11/19/2014 11:30 AM Medical Record Number: [id number] Patient Account Number: [id number] Date of Birth/Sex: [birth year] (69 y.o. Male) Treating RN: Baruch Gouty, RN, BSN, Velva Harman Primary Care Physician: [name] Other Clinician: Referring Physician: [name] Treating Physician/Extender: Frann Rider in Treatment: 0 Encounter Discharge Information Items Discharge Pain Level: 0 Discharge Condition: Stable Ambulatory Status: Cane Discharge Destination:  Home Private Transportation: Auto Accompanied By: wife Schedule Follow-up Appointment: No Medication Reconciliation completed and No provided to Patient/Care Kaislyn Gulas: Clinical Summary of Care: Electronic Signature(s) Signed: 11/19/2014 4:04:56 PM By: Regan Lemming BSN, RN Entered By: Regan Lemming on 11/19/2014 11:43:56 Adam Merritt ([id number]) -------------------------------------------------------------------------------- Patient/Caregiver Education Details Patient Name: Adam Merritt Date of Service: 11/19/2014 11:30 AM Medical Record Number: [id number] Patient Account Number: [id number] Date of Birth/Gender: 23-May-1946 (69 y.o. Male) Treating RN: Baruch Gouty, RN, BSN, Velva Harman Primary Care Physician: [name] Other Clinician: Referring Physician: [name] Treating Physician/Extender: Frann Rider in Treatment: 0 Education Assessment Education Provided To: Patient Education Topics Provided Venous: Methods: Explain/Verbal Responses: State content correctly Electronic Signature(s) Signed: 11/19/2014 4:04:56 PM By: Regan Lemming BSN, RN Entered By: Regan Lemming on 11/19/2014 11:43:34

## 2014-11-24 ENCOUNTER — Encounter: Payer: Medicare Other | Admitting: Surgery

## 2014-11-24 DIAGNOSIS — I890 Lymphedema, not elsewhere classified: Secondary | ICD-10-CM | POA: Diagnosis not present

## 2014-11-24 DIAGNOSIS — I509 Heart failure, unspecified: Secondary | ICD-10-CM | POA: Diagnosis not present

## 2014-11-24 DIAGNOSIS — E669 Obesity, unspecified: Secondary | ICD-10-CM | POA: Diagnosis not present

## 2014-11-24 DIAGNOSIS — L97211 Non-pressure chronic ulcer of right calf limited to breakdown of skin: Secondary | ICD-10-CM | POA: Diagnosis not present

## 2014-11-24 DIAGNOSIS — I83209 Varicose veins of unspecified lower extremity with both ulcer of unspecified site and inflammation: Secondary | ICD-10-CM | POA: Diagnosis not present

## 2014-11-24 DIAGNOSIS — I10 Essential (primary) hypertension: Secondary | ICD-10-CM | POA: Diagnosis not present

## 2014-11-24 DIAGNOSIS — E785 Hyperlipidemia, unspecified: Secondary | ICD-10-CM | POA: Diagnosis not present

## 2014-11-24 DIAGNOSIS — S81801A Unspecified open wound, right lower leg, initial encounter: Secondary | ICD-10-CM | POA: Diagnosis not present

## 2014-11-24 DIAGNOSIS — E119 Type 2 diabetes mellitus without complications: Secondary | ICD-10-CM | POA: Diagnosis not present

## 2014-11-24 DIAGNOSIS — M7989 Other specified soft tissue disorders: Secondary | ICD-10-CM | POA: Diagnosis not present

## 2014-11-24 DIAGNOSIS — L97209 Non-pressure chronic ulcer of unspecified calf with unspecified severity: Secondary | ICD-10-CM | POA: Diagnosis not present

## 2014-11-24 DIAGNOSIS — E0840 Diabetes mellitus due to underlying condition with diabetic neuropathy, unspecified: Secondary | ICD-10-CM | POA: Diagnosis not present

## 2014-11-24 DIAGNOSIS — E11622 Type 2 diabetes mellitus with other skin ulcer: Secondary | ICD-10-CM | POA: Diagnosis not present

## 2014-11-24 DIAGNOSIS — E11610 Type 2 diabetes mellitus with diabetic neuropathic arthropathy: Secondary | ICD-10-CM | POA: Diagnosis not present

## 2014-11-24 DIAGNOSIS — Z09 Encounter for follow-up examination after completed treatment for conditions other than malignant neoplasm: Secondary | ICD-10-CM | POA: Diagnosis not present

## 2014-11-24 DIAGNOSIS — Z872 Personal history of diseases of the skin and subcutaneous tissue: Secondary | ICD-10-CM | POA: Diagnosis not present

## 2014-11-24 DIAGNOSIS — M79609 Pain in unspecified limb: Secondary | ICD-10-CM | POA: Diagnosis not present

## 2014-11-24 DIAGNOSIS — I2510 Atherosclerotic heart disease of native coronary artery without angina pectoris: Secondary | ICD-10-CM | POA: Diagnosis not present

## 2014-11-25 NOTE — Progress Notes (Signed)
Adam Merritt, Adam Merritt ([id number]) Visit Report for 11/24/2014 Arrival Information Details Patient Name: Adam Merritt, Adam Merritt Date of Service: 11/24/2014 11:30 AM Medical Record Number: [id number] Patient Account Number: [id number] Date of Birth/Sex: 1945-07-06 (69 y.o. Male) Treating RN: Afful, RN, BSN, Velva Harman Primary Care Physician: [name] Other Clinician: Referring Physician: [name] Treating Physician/Extender: Frann Rider in Treatment: 1 Visit Information History Since Last Visit Any new allergies or adverse reactions: No Patient Arrived: Adam Merritt Had a fall or experienced change in No Arrival Time: 11:24 activities of daily living that may affect Transfer Assistance: None risk of falls: Patient Identification Verified: Yes Signs or symptoms of abuse/neglect since last No Secondary Verification Process Yes visito Completed: Hospitalized since last visit: No Patient Has Alerts: Yes Has Dressing in Place as Prescribed: Yes Patient Alerts: Patient on Blood Has Compression in Place as Prescribed: Yes Thinner Pain Present Now: No DMII asa 325 ABI [place] bilateral Electronic Signature(s) Signed: 11/24/2014 4:04:52 PM By: Regan Lemming BSN, RN Entered By: Regan Lemming on 11/24/2014 11:25:13 Adam Merritt ([id number]) -------------------------------------------------------------------------------- Clinic Level of Care Assessment Details Patient Name: Adam Merritt Date of Service: 11/24/2014 11:30 AM Medical Record Number: [id number] Patient Account Number: [id number] Date of Birth/Sex: 1945/12/27 (69 y.o. Male) Treating RN: Junious Dresser Primary Care Physician: [name] Other Clinician: Referring Physician: [name] Treating Physician/Extender: Frann Rider in Treatment: 1 Clinic Level of Care Assessment Items TOOL 4 Quantity Score []  - Use when only an EandM is performed on FOLLOW-UP visit 0 ASSESSMENTS - Nursing Assessment / Reassessment []  - Reassessment of  Co-morbidities (includes updates in patient status) 0 []  - Reassessment of Adherence to Treatment Plan 0 ASSESSMENTS - Wound and Skin Assessment / Reassessment []  - Simple Wound Assessment / Reassessment - one wound 0 []  - Complex Wound Assessment / Reassessment - multiple wounds 0 []  - Dermatologic / Skin Assessment (not related to wound area) 0 ASSESSMENTS - Focused Assessment X - Circumferential Edema Measurements - multi extremities 1 5 []  - Nutritional Assessment / Counseling / Intervention 0 X - Lower Extremity Assessment (monofilament, tuning fork, pulses) 1 5 []  - Peripheral Arterial Disease Assessment (using hand held doppler) 0 ASSESSMENTS - Ostomy and/or Continence Assessment and Care []  - Incontinence Assessment and Management 0 []  - Ostomy Care Assessment and Management (repouching, etc.) 0 PROCESS - Coordination of Care X - Simple Patient / Family Education for ongoing care 1 15 []  - Complex (extensive) Patient / Family Education for ongoing care 0 X - Staff obtains Programmer, systems, Records, Test Results / Process Orders 1 10 []  - Staff telephones HHA, Nursing Homes / Clarify orders / etc 0 []  - Routine Transfer to another Facility (non-emergent condition) 0 Adam Merritt, Adam Merritt ([id number]) []  - Routine Hospital Admission (non-emergent condition) 0 []  - New Admissions / Biomedical engineer / Ordering NPWT, Apligraf, etc. 0 []  - Emergency Hospital Admission (emergent condition) 0 X - Simple Discharge Coordination 1 10 []  - Complex (extensive) Discharge Coordination 0 PROCESS - Special Needs []  - Pediatric / Minor Patient Management 0 []  - Isolation Patient Management 0 []  - Hearing / Language / Visual special needs 0 []  - Assessment of Community assistance (transportation, D/C planning, etc.) 0 []  - Additional assistance / Altered mentation 0 []  - Support Surface(s) Assessment (bed, cushion, seat, etc.) 0 INTERVENTIONS - Wound Cleansing / Measurement []  - Simple Wound  Cleansing - one wound 0 []  - Complex Wound Cleansing - multiple wounds 0 X - Wound Imaging (photographs - any  number of wounds) 1 5 []  - Wound Tracing (instead of photographs) 0 []  - Simple Wound Measurement - one wound 0 []  - Complex Wound Measurement - multiple wounds 0 INTERVENTIONS - Wound Dressings []  - Small Wound Dressing one or multiple wounds 0 []  - Medium Wound Dressing one or multiple wounds 0 []  - Large Wound Dressing one or multiple wounds 0 []  - Application of Medications - topical 0 []  - Application of Medications - injection 0 INTERVENTIONS - Miscellaneous []  - External ear exam 0 Adam Merritt, Adam Merritt ([id number]) []  - Specimen Collection (cultures, biopsies, blood, body fluids, etc.) 0 []  - Specimen(s) / Culture(s) sent or taken to Lab for analysis 0 []  - Patient Transfer (multiple staff / Harrel Lemon Lift / Similar devices) 0 []  - Simple Staple / Suture removal (25 or less) 0 []  - Complex Staple / Suture removal (26 or more) 0 []  - Hypo / Hyperglycemic Management (close monitor of Blood Glucose) 0 []  - Ankle / Brachial Index (ABI) - do not check if billed separately 0 X - Vital Signs 1 5 Has the patient been seen at the hospital within the last three years: Yes Total Score: 55 Level Of Care: New/Established - Level 2 Electronic Signature(s) Signed: 11/24/2014 5:19:44 PM By: Junious Dresser RN Entered By: Junious Dresser on 11/24/2014 11:42:54 Adam Merritt ([id number]) -------------------------------------------------------------------------------- Encounter Discharge Information Details Patient Name: Adam Merritt Date of Service: 11/24/2014 11:30 AM Medical Record Number: [id number] Patient Account Number: [id number] Date of Birth/Sex: [birth year] (69 y.o. Male) Treating RN: Primary Care Physician: [name] Other Clinician: Referring Physician: [name] Treating Physician/Extender: Frann Rider in Treatment: 1 Encounter Discharge Information Items Schedule  Follow-up Appointment: No Medication Reconciliation completed No and provided to Patient/Care Adam Merritt: Provided on Clinical Summary of Care: 11/24/2014 Form Type Recipient Paper Patient JT Electronic Signature(s) Signed: 11/24/2014 11:44:01 AM By: Ruthine Dose Entered By: Ruthine Dose on 11/24/2014 11:44:01 Adam Merritt ([id number]) -------------------------------------------------------------------------------- General Visit Notes Details Patient Name: Adam Merritt Date of Service: 11/24/2014 11:30 AM Medical Record Number: [id number] Patient Account Number: [id number] Date of Birth/Sex: 1946/05/06 (69 y.o. Male) Treating RN: Baruch Gouty, RN, BSN, Velva Harman Primary Care Physician: [name] Other Clinician: Referring Physician: [name] Treating Physician/Extender: Frann Rider in Treatment: 1 Notes Applied moisturizing lotion applied to left lower leg. Electronic Signature(s) Signed: 11/24/2014 4:04:52 PM By: Regan Lemming BSN, RN Entered By: Regan Lemming on 11/24/2014 11:49:10 Adam Merritt ([id number]) -------------------------------------------------------------------------------- Lower Extremity Assessment Details Patient Name: Adam Merritt Date of Service: 11/24/2014 11:30 AM Medical Record Number: [id number] Patient Account Number: [id number] Date of Birth/Sex: [birth year] (69 y.o. Male) Treating RN: Afful, RN, BSN, Velva Harman Primary Care Physician: [name] Other Clinician: Referring Physician: [name] Treating Physician/Extender: Frann Rider in Treatment: 1 Edema Assessment Assessed: [Left: No] [Right: No] E[Left: dema] [Right: :] Calf Left: Right: Point of Measurement: 34 cm From Medial Instep cm 57.8 cm Ankle Left: Right: Point of Measurement: 10 cm From Medial Instep cm 34.7 cm Vascular Assessment Claudication: Claudication Assessment [Right:None] Pulses: Posterior Tibial Dorsalis Pedis Palpable: [Right:Yes] Extremity colors, hair  growth, and conditions: Extremity Color: [Right:Hyperpigmented] Hair Growth on Extremity: [Right:No] Temperature of Extremity: [Right:Warm] Capillary Refill: [Right:< 3 seconds] Dependent Rubor: [Right:No] Blanched when Elevated: [Right:No] Lipodermatosclerosis: [Right:No] Toe Nail Assessment Left: Right: Thick: Yes Discolored: Yes Deformed: No Improper Length and Hygiene: No Adam Merritt, Adam Merritt ([id number]) Electronic Signature(s) Signed: 11/24/2014 4:04:52 PM By: Regan Lemming BSN, RN Entered By: Regan Lemming on 11/24/2014 11:25:56  Adam Merritt, Adam Merritt ([id number]) -------------------------------------------------------------------------------- Multi Wound Chart Details Patient Name: Adam Merritt, Adam Merritt Date of Service: 11/24/2014 11:30 AM Medical Record Number: [id number] Patient Account Number: [id number] Date of Birth/Sex: 1946/05/28 (69 y.o. Male) Treating RN: Junious Dresser Primary Care Physician: [name] Other Clinician: Referring Physician: [name] Treating Physician/Extender: Frann Rider in Treatment: 1 Vital Signs Height(in): 65 Pulse(bpm): 75 Weight(lbs): 342 Blood Pressure 165/82 (mmHg): Body Mass Index(BMI): 57 Temperature(F): 97.9 Respiratory Rate 21 (breaths/min): Photos: [1:No Photos] [N/A:N/A] Wound Location: [1:Right Lower Leg - Posterior] [N/A:N/A] Wounding Event: [1:Gradually Appeared] [N/A:N/A] Primary Etiology: [1:Lymphedema] [N/A:N/A] Comorbid History: [1:Anemia, Lymphedema, Chronic Obstructive Pulmonary Disease (COPD), Sleep Apnea, Congestive Heart Failure, Coronary Artery Disease, Hypertension, Peripheral Venous Disease, Type II Diabetes, Osteoarthritis, Neuropathy, Received  Chemotherapy] [N/A:N/A] Date Acquired: [1:10/26/2014] [N/A:N/A] Weeks of Treatment: [1:1] [N/A:N/A] Wound Status: [1:Open] [N/A:N/A] Measurements L x W x D 0x0x0 [N/A:N/A] (cm) Area (cm) : [1:0] [N/A:N/A] Volume (cm) : [1:0] [N/A:N/A] % Reduction in Area: [1:100.00%]  [N/A:N/A] % Reduction in Volume: 100.00% [N/A:N/A] Classification: [1:Partial Thickness] [N/A:N/A] HBO Classification: [1:Grade 1] [N/A:N/A] Exudate Amount: [1:None Present] [N/A:N/A] Wound Margin: [1:Flat and Intact] [N/A:N/A] Granulation Amount: [1:Large (67-100%)] [N/A:N/A] Granulation Quality: Red N/A N/A Necrotic Amount: None Present (0%) N/A N/A Exposed Structures: Fascia: No N/A N/A Fat: No Tendon: No Muscle: No Joint: No Bone: No Limited to Skin Breakdown Epithelialization: Large (67-100%) N/A N/A Periwound Skin Texture: Edema: Yes N/A N/A Excoriation: No Induration: No Callus: No Crepitus: No Fluctuance: No Friable: No Rash: No Scarring: No Periwound Skin Maceration: No N/A N/A Moisture: Moist: No Dry/Scaly: No Periwound Skin Color: Atrophie Blanche: No N/A N/A Cyanosis: No Ecchymosis: No Erythema: No Hemosiderin Staining: No Mottled: No Pallor: No Rubor: No Temperature: No Abnormality N/A N/A Tenderness on No N/A N/A Palpation: Wound Preparation: Ulcer Cleansing: Other: N/A N/A soap and water Treatment Notes Electronic Signature(s) Signed: 11/24/2014 5:19:44 PM By: Junious Dresser RN Entered By: Junious Dresser on 11/24/2014 11:39:39 Adam Merritt ([id number]) -------------------------------------------------------------------------------- [place] Details Patient Name: Adam Merritt Date of Service: 11/24/2014 11:30 AM Medical Record Number: [id number] Patient Account Number: [id number] Date of Birth/Sex: 05/19/1946 (69 y.o. Male) Treating RN: Junious Dresser Primary Care Physician: [name] Other Clinician: Referring Physician: [name] Treating Physician/Extender: Frann Rider in Treatment: 1 Active Inactive Electronic Signature(s) Signed: 11/24/2014 5:19:44 PM By: Junious Dresser RN Entered By: Junious Dresser on 11/24/2014 11:39:30 Adam Merritt  ([id number]) -------------------------------------------------------------------------------- Pain Assessment Details Patient Name: Adam Merritt Date of Service: 11/24/2014 11:30 AM Medical Record Number: [id number] Patient Account Number: [id number] Date of Birth/Sex: 1945-06-29 (69 y.o. Male) Treating RN: Baruch Gouty, RN, BSN, Velva Harman Primary Care Physician: [name] Other Clinician: Referring Physician: [name] Treating Physician/Extender: Frann Rider in Treatment: 1 Active Problems Location of Pain Severity and Description of Pain Patient Has Paino No Site Locations Pain Management and Medication Current Pain Management: Electronic Signature(s) Signed: 11/24/2014 4:04:52 PM By: Regan Lemming BSN, RN Entered By: Regan Lemming on 11/24/2014 11:25:19 Adam Merritt ([id number]) -------------------------------------------------------------------------------- Wound Assessment Details Patient Name: Adam Merritt Date of Service: 11/24/2014 11:30 AM Medical Record Number: [id number] Patient Account Number: [id number] Date of Birth/Sex: 29-Oct-1945 (69 y.o. Male) Treating RN: Baruch Gouty, RN, BSN, Velva Harman Primary Care Physician: [name] Other Clinician: Referring Physician: [name] Treating Physician/Extender: Frann Rider in Treatment: 1 Wound Status Wound Number: 1 Primary Lymphedema Etiology: Wound Location: Right Lower Leg - Posterior Wound Healed - Epithelialized Wounding Event: Gradually Appeared Status: Date Acquired: 10/26/2014 Comorbid  Anemia, Lymphedema, Chronic Weeks Of Treatment: 1 History: Obstructive Pulmonary Disease (COPD), Clustered Wound: No Sleep Apnea, Congestive Heart Failure, Coronary Artery Disease, Hypertension, Peripheral Venous Disease, Type II Diabetes, Osteoarthritis, Neuropathy, Received Chemotherapy Photos Photo Uploaded By: Regan Lemming on 11/24/2014 15:53:14 Wound Measurements Length: (cm) 0 % Reduction Width: (cm) 0 %  Reduction Depth: (cm) 0 Epitheliali Area: (cm) 0 Tunneling: Volume: (cm) 0 Underminin in Area: 100% in Volume: 100% zation: Large (67-100%) No g: No Wound Description Classification: Partial Thickness Foul Odor Diabetic Severity Adam [place]): Grade 1 Wound Margin: Flat and Intact Exudate Amount: None Present After Cleansing: No Wound Bed Granulation Amount: None Present (0%) Exposed Structure Adam Merritt, Adam Merritt ([id number]) Necrotic Amount: None Present (0%) Fascia Exposed: No Fat Layer Exposed: No Tendon Exposed: No Muscle Exposed: No Joint Exposed: No Bone Exposed: No Limited to Skin Breakdown Periwound Skin Texture Texture Color No Abnormalities Noted: No No Abnormalities Noted: No Callus: No Atrophie Blanche: No Crepitus: No Cyanosis: No Excoriation: No Ecchymosis: No Fluctuance: No Erythema: No Friable: No Hemosiderin Staining: No Induration: No Mottled: No Localized Edema: Yes Pallor: No Rash: No Rubor: No Scarring: No Temperature / Pain Moisture Temperature: No Abnormality No Abnormalities Noted: No Dry / Scaly: No Maceration: No Moist: No Wound Preparation Ulcer Cleansing: Other: soap and water, Electronic Signature(s) Signed: 11/24/2014 4:04:52 PM By: Regan Lemming BSN, RN Signed: 11/24/2014 5:19:44 PM By: Junious Dresser RN Entered By: Junious Dresser on 11/24/2014 11:41:09 Adam Merritt ([id number]) -------------------------------------------------------------------------------- Vitals Details Patient Name: Adam Merritt Date of Service: 11/24/2014 11:30 AM Medical Record Number: [id number] Patient Account Number: [id number] Date of Birth/Sex: [birth year] (69 y.o. Male) Treating RN: Afful, RN, BSN, [place] Primary Care Physician: [name] Other Clinician: Referring Physician: [name] Treating Physician/Extender: Frann Rider in Treatment: 1 Vital Signs Time Taken: 11:30 Temperature (F): 97.9 Height (in): 65 Pulse (bpm):  75 Weight (lbs): 342 Respiratory Rate (breaths/min): 21 Body Mass Index (BMI): 56.9 Blood Pressure (mmHg): 165/82 Reference Range: 80 - 120 mg / dl Electronic Signature(s) Signed: 11/24/2014 4:04:52 PM By: Regan Lemming BSN, RN Entered By: Regan Lemming on 11/24/2014 11:30:40

## 2014-11-26 NOTE — Progress Notes (Signed)
Adam Merritt ([id number]) Visit Report for 11/24/2014 Chief Complaint Document Details Patient Name: Adam Merritt Date of Service: 11/24/2014 11:30 AM Medical Record Number: [id number] Patient Account Number: [id number] Date of Birth/Sex: January 18, 1946 (69 y.o. Male) Treating RN: Primary Care Physician: [name] Other Clinician: Referring Physician: [name] Treating Physician/Extender: Frann Rider in Treatment: 1 Information Obtained from: Patient Chief Complaint Patient presents to the wound care center for a consult due non healing wound. 69 year old gentleman who comes with bilateral lower limb edema for over 20 years and recent attack of blisters and redness for about 2 weeks. Electronic Signature(s) Signed: 11/24/2014 12:24:35 PM By: Christin Fudge MD, FACS Entered By: Christin Fudge on 11/24/2014 11:45:04 Adam Merritt ([id number]) -------------------------------------------------------------------------------- HPI Details Patient Name: Adam Merritt Date of Service: 11/24/2014 11:30 AM Medical Record Number: [id number] Patient Account Number: [id number] Date of Birth/Sex: Oct 10, 1945 (69 y.o. Male) Treating RN: Primary Care Physician: [name] Other Clinician: Referring Physician: [name] Treating Physician/Extender: Frann Rider in Treatment: 1 History of Present Illness Location: bilateral lower leg edema with some blisters recently. Quality: Patient reports experiencing a dull pain to affected area(s). Severity: Patient states wound (s) are getting better. Duration: Patient has had the wound for < 2 weeks prior to presenting for treatment Timing: Pain in wound is Intermittent (comes and goes Context: The wound appeared gradually over time Modifying Factors: Consults to this date include: he has been seen by his PCP and started on Keflex recently. Associated Signs and Symptoms: Patient reports having difficulty standing for long periods. HPI  Description: The patient says that he may have scratched some blisters and started getting a infection and he is now better after his PCP put him on Keflex for 2 weeks. he has had bilateral lower limb edema since 1996. He has been noncompliant with wearing wraps in the past and even was given a boot to wear at night which she never tolerated and didn't wear it. He has a past medical history of sleep apnea, neuropathy with diabetes, hypertension, hyperlipidemia, coronary artery disease, varicose veins, carotid artery disease and is status post CABG and cholecystectomy. He was seen by the vascular surgeon on 09/29/2013 and Dr. Trula Slade opinion was:"I have reviewed his outside ultrasound which shows an ankle-brachial index of 0.72 on the left and 1.0 on the right.o This study was repeated at our office.o The ankle-brachial index on the right was 1.26 with triphasic waveforms.o On the left it was a 1.04 with biphasic waveforms.o The patient had a toe pressure of 121 on the right and 100 on the left. In summary, I would not recommend any further imaging , as I do not think his symptoms are related to arterial insufficiency, but rather secondary to diabetic neuropathy.o The patient still has room for increasing his Neurontin dose, which he states does help him.o I have scheduled him to come back in one year for a repeat arterial evaluation." 11/24/2014 -- he has done very well and he has a venous duplex study to be done this afternoon. He is also going to be accepted to the lymphedema clinic and we will make the necessary arrangements. Electronic Signature(s) Signed: 11/24/2014 12:24:35 PM By: Christin Fudge MD, FACS Entered By: Christin Fudge on 11/24/2014 11:45:41 Adam Merritt ([id number]) -------------------------------------------------------------------------------- Physical Exam Details Patient Name: Adam Merritt Date of Service: 11/24/2014 11:30 AM Medical Record Number: [id number] Patient  Account Number: [id number] Date of Birth/Sex: 24-Jan-1946 (69 y.o. Male) Treating RN: Primary Care Physician: Humphrey Rolls,  [place].Rei Other Clinician: Referring Physician: [name] Treating Physician/Extender: Frann Rider in Treatment: 1 Constitutional . Pulse regular. Respirations normal and unlabored. Afebrile. . Eyes Nonicteric. Reactive to light. Ears, Nose, Mouth, and Throat Lips, teeth, and gums WNL.Marland Kitchen Moist mucosa without lesions . Neck supple and nontender. No palpable supraclavicular or cervical adenopathy. Normal sized without goiter. Respiratory WNL. No retractions.. Cardiovascular Pedal Pulses WNL. massive bilateral lymphedema stage II. Integumentary (Hair, Skin) the ulceration is a right lower extremity is completely healed.. No crepitus or fluctuance. No peri-wound warmth or erythema. No masses.Marland Kitchen Psychiatric Judgement and insight Intact.. No evidence of depression, anxiety, or agitation.. Electronic Signature(s) Signed: 11/24/2014 12:24:35 PM By: Christin Fudge MD, FACS Entered By: Christin Fudge on 11/24/2014 11:46:35 Adam Merritt ([id number]) -------------------------------------------------------------------------------- Physician Orders Details Patient Name: Adam Merritt Date of Service: 11/24/2014 11:30 AM Medical Record Number: [id number] Patient Account Number: [id number] Date of Birth/Sex: 1945-08-01 (69 y.o. Male) Treating RN: Junious Dresser Primary Care Physician: [name] Other Clinician: Referring Physician: [name] Treating Physician/Extender: Frann Rider in Treatment: 1 Verbal / Phone Orders: Yes Clinician: Junious Dresser Read Back and Verified: Yes Diagnosis Coding Skin Barriers/Peri-Wound Care o Moisturizing lotion Edema Control o Support Garment 20-30 mm/Hg pressure to: Discharge From [facility] Services o Discharge from [place] [facility] [place] [facility] - Refer to clinic oooo Electronic  Signature(s) Signed: 11/24/2014 12:24:35 PM By: Christin Fudge MD, FACS Signed: 11/24/2014 5:19:44 PM By: Junious Dresser RN Entered By: Junious Dresser on 11/24/2014 11:42:26 Adam Merritt ([id number]) -------------------------------------------------------------------------------- Problem List Details Patient Name: Adam Merritt Date of Service: 11/24/2014 11:30 AM Medical Record Number: [id number] Patient Account Number: [id number] Date of Birth/Sex: 07-14-1945 (69 y.o. 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/17/2014 Yes arthropathy E08.40 Diabetes mellitus due to underlying condition with diabetic 11/17/2014 Yes neuropathy, unspecified I89.0 Lymphedema, not elsewhere classified 11/17/2014 Yes E66.01 Morbid (severe) obesity due to excess calories 11/17/2014 Yes Inactive Problems Resolved Problems Electronic Signature(s) Signed: 11/24/2014 12:24:35 PM By: Christin Fudge MD, FACS Entered By: Christin Fudge on 11/24/2014 11:44:57 Adam Merritt ([id number]) -------------------------------------------------------------------------------- Progress Note Details Patient Name: Adam Merritt Date of Service: 11/24/2014 11:30 AM Medical Record Number: [id number] Patient Account Number: [id number] Date of Birth/Sex: 1945/07/27 (69 y.o. Male) Treating RN: Primary Care Physician: [name] Other Clinician: Referring Physician: [name] Treating Physician/Extender: Frann Rider in Treatment: 1 Subjective Chief Complaint Information obtained from Patient Patient presents to the wound care center for a consult due non healing wound. 69 year old gentleman who comes with bilateral lower limb edema for over 20 years and recent  attack of blisters and redness for about 2 weeks. History of Present Illness (HPI) The following HPI elements were documented for the patient's wound: Location: bilateral lower leg edema with some blisters recently. Quality: Patient reports experiencing a dull pain to affected area(s). Severity: Patient states wound (s) are getting better. Duration: Patient has had the wound for < 2 weeks prior to presenting for treatment Timing: Pain in wound is Intermittent (comes and goes Context: The wound appeared gradually over time Modifying Factors: Consults to this date include: he has been seen by his PCP and started on Keflex recently. Associated Signs and Symptoms: Patient reports having difficulty standing for long periods. The patient says that he may have  scratched some blisters and started getting a infection and he is now better after his PCP put him on Keflex for 2 weeks. he has had bilateral lower limb edema since 1996. He has been noncompliant with wearing wraps in the past and even was given a boot to wear at night which she never tolerated and didn't wear it. He has a past medical history of sleep apnea, neuropathy with diabetes, hypertension, hyperlipidemia, coronary artery disease, varicose veins, carotid artery disease and is status post CABG and cholecystectomy. He was seen by the vascular surgeon on 09/29/2013 and Dr. Trula Slade opinion was:"I have reviewed his outside ultrasound which shows an ankle-brachial index of 0.72 on the left and 1.0 on the right. This study was repeated at our office. The ankle-brachial index on the right was 1.26 with triphasic waveforms. On the left it was a 1.04 with biphasic waveforms. The patient had a toe pressure of 121 on the right and 100 on the left. In summary, I would not recommend any further imaging , as I do not think his symptoms are related to arterial insufficiency, but rather secondary to diabetic neuropathy. The patient still has room  for increasing his Neurontin dose, which he states does help him. I have scheduled him to come back in one year for a repeat arterial evaluation." 11/24/2014 -- he has done very well and he has a venous duplex study to be done this afternoon. He is also going to be accepted to the lymphedema clinic and we will make the necessary arrangements. Adam Merritt, Adam Merritt ([id number]) Objective Constitutional Pulse regular. Respirations normal and unlabored. Afebrile. Vitals Time Taken: 11:30 AM, Height: 65 in, Weight: 342 lbs, BMI: 56.9, Temperature: 97.9 F, Pulse: 75 bpm, Respiratory Rate: 21 breaths/min, Blood Pressure: 165/82 mmHg. Eyes Nonicteric. Reactive to light. Ears, Nose, Mouth, and Throat Lips, teeth, and gums WNL.Marland Kitchen Moist mucosa without lesions . Neck supple and nontender. No palpable supraclavicular or cervical adenopathy. Normal sized without goiter. Respiratory WNL. No retractions.. Cardiovascular Pedal Pulses WNL. massive bilateral lymphedema stage II. Psychiatric Judgement and insight Intact.. No evidence of depression, anxiety, or agitation.. Integumentary (Hair, Skin) the ulceration is a right lower extremity is completely healed.. No crepitus or fluctuance. No peri-wound warmth or erythema. No masses.. Wound #1 status is Healed - Epithelialized. Original cause of wound was Gradually Appeared. The wound is located on the Right,Posterior Lower Leg. The wound measures 0cm length x 0cm width x 0cm depth; 0cm^2 area and 0cm^3 volume. The wound is limited to skin breakdown. There is no tunneling or undermining noted. There is a none present amount of drainage noted. The wound margin is flat and intact. There is no granulation within the wound bed. There is no necrotic tissue within the wound bed. The periwound skin appearance exhibited: Localized Edema. The periwound skin appearance did not exhibit: Callus, Crepitus, Excoriation, Fluctuance, Friable, Induration, Rash, Scarring,  Dry/Scaly, Maceration, Moist, Atrophie Blanche, Cyanosis, Ecchymosis, Hemosiderin Staining, Mottled, Pallor, Rubor, Erythema. Periwound temperature was noted as No Abnormality. Adam Merritt, Adam Merritt ([id number]) Assessment Active Problems ICD-10 E11.610 - Type 2 diabetes mellitus with diabetic neuropathic arthropathy E08.40 - Diabetes mellitus due to underlying condition with diabetic neuropathy, unspecified I89.0 - Lymphedema, not elsewhere classified E66.01 - Morbid (severe) obesity due to excess calories Diagnoses ICD-10 E11.610: Type 2 diabetes mellitus with diabetic neuropathic arthropathy E08.40: Diabetes mellitus due to underlying condition with diabetic neuropathy, unspecified I89.0: Lymphedema, not elsewhere classified E66.01: Morbid (severe) obesity due to excess calories He has done very  well and he has a venous duplex study to be done this afternoon. He is also going to be accepted to the lymphedema clinic and we will make the necessary arrangements. I have advised him to wear compression stockings of the 20-30 mm variety tell he gets into the lymphedema clinic. A prescription is given. Asked him to follow-up with the vascular surgeons in case they have significant varicose veins. He is discharged with wound care services and will be seen back as needed. Plan Skin Barriers/Peri-Wound Care: Moisturizing lotion Edema Control: Support Garment 20-30 mm/Hg pressure to: Discharge From [facility] Services: Discharge from [place] and Therapies ordered were: Lymphedema Clinic - Refer to clinic Adam Merritt, Adam Merritt. ([id number]) He has done very well and he has a venous duplex study to be done this afternoon. He is also going to be accepted to the lymphedema clinic and we will make the necessary arrangements. I have advised him to wear compression stockings of the 20-30 mm variety tell he gets into the lymphedema clinic. A prescription is given. Asked him to follow-up with the  vascular surgeons in case they have significant varicose veins. He is discharged with wound care services and will be seen back as needed. Electronic Signature(s) Signed: 11/24/2014 5:13:38 PM By: Junious Dresser RN Signed: 11/25/2014 5:31:33 PM By: Christin Fudge MD, FACS Previous Signature: 11/24/2014 12:24:35 PM Version By: Christin Fudge MD, FACS Entered By: Junious Dresser on 11/24/2014 17:13:38 Adam Merritt ([id number]) -------------------------------------------------------------------------------- SuperBill Details Patient Name: Adam Merritt Date of Service: 11/24/2014 Medical Record Number: [id number] Patient Account Number: [id number] Date of Birth/Sex: 03/24/1946 (69 y.o. 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 E08.40 Diabetes mellitus due to underlying condition with diabetic neuropathy, unspecified I89.0 Lymphedema, not elsewhere classified E66.01 Morbid (severe) obesity due to excess calories Facility Procedures CPT4 Code: [id number] Description: [phone] - WOUND CARE VISIT-LEV 2 EST PT Modifier: Quantity: 1 Physician Procedures CPT4: Description Modifier Quantity Code [id number] - WC PHYS LEVEL 3 - EST PT 1 ICD-10 Description Diagnosis E11.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

## 2014-11-30 DIAGNOSIS — I5032 Chronic diastolic (congestive) heart failure: Secondary | ICD-10-CM | POA: Diagnosis not present

## 2014-11-30 DIAGNOSIS — G4733 Obstructive sleep apnea (adult) (pediatric): Secondary | ICD-10-CM | POA: Diagnosis not present

## 2014-11-30 DIAGNOSIS — R600 Localized edema: Secondary | ICD-10-CM | POA: Diagnosis not present

## 2014-11-30 DIAGNOSIS — I25810 Atherosclerosis of coronary artery bypass graft(s) without angina pectoris: Secondary | ICD-10-CM | POA: Diagnosis not present

## 2014-12-16 DIAGNOSIS — R0602 Shortness of breath: Secondary | ICD-10-CM | POA: Diagnosis not present

## 2014-12-17 ENCOUNTER — Ambulatory Visit: Payer: Medicare Other | Admitting: Occupational Therapy

## 2014-12-24 DIAGNOSIS — E11610 Type 2 diabetes mellitus with diabetic neuropathic arthropathy: Secondary | ICD-10-CM | POA: Diagnosis not present

## 2014-12-24 DIAGNOSIS — I10 Essential (primary) hypertension: Secondary | ICD-10-CM | POA: Diagnosis not present

## 2014-12-24 DIAGNOSIS — M792 Neuralgia and neuritis, unspecified: Secondary | ICD-10-CM | POA: Diagnosis not present

## 2014-12-24 DIAGNOSIS — J449 Chronic obstructive pulmonary disease, unspecified: Secondary | ICD-10-CM | POA: Diagnosis not present

## 2014-12-24 DIAGNOSIS — J069 Acute upper respiratory infection, unspecified: Secondary | ICD-10-CM | POA: Diagnosis not present

## 2014-12-24 DIAGNOSIS — M150 Primary generalized (osteo)arthritis: Secondary | ICD-10-CM | POA: Diagnosis not present

## 2014-12-28 ENCOUNTER — Ambulatory Visit: Payer: Medicare Other | Attending: Surgery | Admitting: Occupational Therapy

## 2014-12-28 ENCOUNTER — Encounter: Payer: Self-pay | Admitting: Occupational Therapy

## 2014-12-28 VITALS — Ht 66.0 in | Wt 349.0 lb

## 2014-12-28 DIAGNOSIS — I890 Lymphedema, not elsewhere classified: Secondary | ICD-10-CM

## 2014-12-29 ENCOUNTER — Encounter: Payer: Self-pay | Admitting: Occupational Therapy

## 2014-12-30 NOTE — Patient Instructions (Signed)

## 2014-12-30 NOTE — Therapy (Signed)
[place] [facility] SERVICES [address] [place], [place], [address] Phone: [phone]   Fax:  [phone]  Occupational Therapy Evaluation  Patient Details  Name: Adam Merritt MRN: [id number] Date of Birth: 1946/02/20 Referring Provider:  Christin Fudge, MD  Encounter Date: 12/28/2014      OT End of Session - 12/30/14 0829    Visit Number 1   Number of Visits 36   Date for OT Re-Evaluation 03/28/15   OT Start Time 1300   OT Stop Time 1350   OT Time Calculation (min) 50 min   Activity Tolerance Patient tolerated treatment well;No increased pain   Behavior During Therapy [facility] for tasks assessed/performed      Past Medical History  Diagnosis Date  . Obstructive chronic bronchitis without exacerbation   . Insomnia   . Chest pain   . Lumbago   . Herpes zoster without mention of complication   . Neuropathy in diabetes   . Hypertension   . Hyperlipidemia   . Diabetes mellitus without complication   . Coronary artery disease   . Varicose veins   . Esophageal reflux   . Other malaise and fatigue   . Anemia   . CAD (coronary artery disease)   . Stroke     Past Surgical History  Procedure Laterality Date  . Coronary artery bypass graft    . Cholecystectomy      Filed Vitals:   12/28/14 1316  Height: 5\' 6"  (1.676 m)  Weight: 349 lb (158.305 kg)    Visit Diagnosis:  Lymphedema - Plan: Ot plan of care cert/re-cert      Subjective Assessment - 12/29/14 1147    Subjective  Pt presents for OT evaluation and treatment for BLE lymphedema (LE)  2/2 to periferal vascular disease and obesity, L>R, w/ onset > 20 years . LLE is considered mild stage III elephantiasis in keeping w/ tissue condition and skin changes, and RLE appears consistent w/ moderate stage II LE. Pt endorses worsening sensory symptoms and funtional limitations associated w/ LE.Marland Kitchen   Patient is accompained by: Family member   Pertinent History DM precautions, Carotid  artery precautions, diabetic neuropathy, hx vascular ulcers, hx keflex, OA, no hx DVT   Limitations difficulty walking, altered sensation, chronic leg pain,    Patient Stated Goals decrease swelling,, limit infection, prevents chronic non healing wounds   Currently in Pain? No/denies             LYMPHEDEMA/ONCOLOGY QUESTIONNAIRE - 12/29/14 1204    What other symptoms do you have   Are you Having Heaviness or Tightness Yes   Are you having Pain Yes   Are you having pitting edema No   Is it Hard or Difficult finding clothes that fit Yes   Do you have infections Yes   Stemmer Sign Yes   Lymphedema Stage   Stage STAGE 3 ELEPHANTIASIS   Lymphedema Assessments   Lymphedema Assessments Lower extremities   Right Lower Extremity Lymphedema   Other TBA   Left Lower Extremity Lymphedema   Other TBA                       OT Education - 12/30/14 0826    Education provided Yes   Education Details Skilled education provided for lymphedema (LE) etiology, progression and treatment. Discussed infection risk and implications for comorbidity w/ DM. Provided LE Workbook.   Person(s) Educated Patient;Spouse   Methods Explanation;Demonstration;Tactile cues;Verbal cues;Handout  Comprehension Verbalized understanding;Need further instruction             OT Long Term Goals - 12/30/14 0855    OT LONG TERM GOAL #1   Title Pt able to correctly apply gradient compression wraps to below knee with moderate assistance from caregiver within 2 weeks for optimal limb volume reduction.   Baseline dependent   Time 2   Period Weeks   Status New   OT LONG TERM GOAL #2   Title 20% limb volume reduction to be achhieved bilaterally by end of Intensive Phase CDT   Baseline dependent   Time 12   Period Weeks   Status New   OT LONG TERM GOAL #3   Title Pt mod assist and at least 75% compliant with all LE self-care protocols, including simple self-manual lymphatic drainage (MLD), skin  care, lymphatic pumping ther ex, and donning/ doffing progression garments.   Baseline dependent   Time 12   Period Weeks   Status New   OT LONG TERM GOAL #4   Title Pt to remain infection free to limit non healing wounds/ leg ulcers, infection and LE progression.   Baseline dependent   Time 12   Period Weeks   Status On-going   OT LONG TERM GOAL #5   Title During Management Phase CDT Pt to sustain limb volume reductions achieved during Intensive Phase CDT within 5% utilizing LE self care protocols.   Baseline dependent   Time 6   Period Months   Status New               Plan - 12/30/14 0830    Clinical Impression Statement Pt presents with BLE lymphedema, L>R, secondary to PVD and obesity. RLE is assessed as moderate, stage II and LLE is assessed as moderate, stage 3 elephantiasis. Swelling and associated pain / discomfort limits ambulation and functional mobility and Pt's ability to perform basic and instrumental ADLs. LE limits his participation in productive and leisure activities, and limits his ability to participate in family and community activities. Without skilled Occupational Therapy for Complete Decongestive Therapy (CDT) to address chronic, progressive BLE LE, This Pt's condition will worsen and further functional decline is expected.   Pt will benefit from skilled therapeutic intervention in order to improve on the following deficits (Retired) Decreased skin integrity;Decreased knowledge of precautions;Impaired perceived functional ability;Decreased activity tolerance;Decreased knowledge of use of DME;Decreased mobility;Difficulty walking;Impaired sensation;Obesity;Decreased range of motion;Increased edema;Pain   Rehab Potential Good   OT Frequency 3x / week   OT Duration 12 weeks   OT Treatment/Interventions Self-care/ADL training;Therapeutic exercise;Patient/family education;Manual Therapy;Manual lymph drainage;DME and/or AE instruction;Compression  bandaging;Therapeutic activities   Plan Emphasis of CDT will be on LLE Rx. Once limb volume is reduced and reaches a clinical plateau, Pt will be fit with custom, flat knit, ccl 3 or 4 Jobst Elvarex knee high for full time daily use. He will also be fit with an adjustable, convoluted foam boot (Reid Sleeve) calibrated to 30 mmHg to assist with reduction of tissue fibrosis and to facilitate lymphatic decongestion during HOS. Once CDT to LLE is complete, we'll repeat the above Intensive Phase course for the RLE. Pt will be encouraged to follow along with OT on regular basis during Management Phase to facilitate successful, ongoing LE self-care.   OT Home Exercise Plan see Pt Instructions   Recommended Other Services Bilateral, custom Jobst Elvarex AD,  ccl 3-4, T heel, open toe, w/ silicone top band; Reid sleeve classic AD, Consider  toe caps PRN.   Consulted and Agree with Plan of Care Patient          G-Codes - 11-Jan-2015 0904    Functional Assessment Tool Used clinical observation and assessment, anatomical measurements and limb volumetrics, interview   Functional Limitation Self care   Self Care Current Status [phone]) At least 80 percent but less than 100 percent impaired, limited or restricted   Self Care Goal Status [id number]) At least 20 percent but less than 40 percent impaired, limited or restricted      Problem List Patient Active Problem List   Diagnosis Date Noted  . Peripheral vascular disease, unspecified 09/29/2013   Andrey Spearman, MS, OTR/L, CLT-LANA 01/11/15 9:19 AM  Ansel Bong 01/11/2015, 9:19 AM  [place] [facility] SERVICES [address] [place], [place], [address] Phone: [phone]   Fax:  [phone]

## 2015-01-04 ENCOUNTER — Ambulatory Visit: Payer: Medicare Other | Attending: Surgery | Admitting: Occupational Therapy

## 2015-01-04 ENCOUNTER — Encounter: Payer: Self-pay | Admitting: Occupational Therapy

## 2015-01-04 VITALS — Ht 66.0 in | Wt 345.0 lb

## 2015-01-04 DIAGNOSIS — I890 Lymphedema, not elsewhere classified: Secondary | ICD-10-CM | POA: Diagnosis not present

## 2015-01-04 NOTE — Therapy (Signed)
[place] MAIN [facility] SERVICES [address] [place], [place], [address] Phone: [phone]   Fax:  [phone]  Occupational Therapy Treatment  Patient Details  Name: Adam Merritt MRN: [id number] Date of Birth: Jan 15, 1946 Referring Provider:  Christin Fudge, MD  Encounter Date: 01/04/2015      OT End of Session - 01/04/15 1038    Visit Number 2   Number of Visits 36   Date for OT Re-Evaluation 03/28/15   OT Start Time 0805   OT Stop Time 0910   OT Time Calculation (min) 65 min      Past Medical History  Diagnosis Date  . Obstructive chronic bronchitis without exacerbation   . Insomnia   . Chest pain   . Lumbago   . Herpes zoster without mention of complication   . Neuropathy in diabetes   . Hypertension   . Hyperlipidemia   . Diabetes mellitus without complication   . Coronary artery disease   . Varicose veins   . Esophageal reflux   . Other malaise and fatigue   . Anemia   . CAD (coronary artery disease)   . Stroke     Past Surgical History  Procedure Laterality Date  . Coronary artery bypass graft    . Cholecystectomy      Filed Vitals:   01/04/15 1026  Height: 5\' 6"  (1.676 m)  Weight: 345 lb (156.491 kg)    Visit Diagnosis:  Lymphedema      Subjective Assessment - 01/04/15 1203    Subjective  Pt presents for OT visit 2 for CDT to BLE lymphedema (LE)  2/2 to periferal vascular disease and obesity, L>R, w/ onset > 20 years . 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 Pt endorses worsening sensory symptoms and funtional limitations associated w/ LE.Marland Kitchen Pt is accompanied by his wife, Adam Merritt. He states, "I'm not feeling good today. I almost didnt come. Pt has hx of non-compliance w/ lymphedema self care and participation in OT. We discussed importance of follow through with OT instructions for optimal clinical  outcome, and discussed importance of attending all scheduled apppointments.  Pt  agreed to let me know in advance if he  finds the burden of care is too great for him.   Patient is accompained by: Family member   Pertinent History DM precautions, Carotid artery precautions, diabetic neuropathy, hx vascular ulcers, hx keflex, OA, no hx DVT   Limitations difficulty walking, altered sensation, chronic leg pain,    Patient Stated Goals decrease swelling,, limit infection, prevents chronic non healing wounds             LYMPHEDEMA/ONCOLOGY QUESTIONNAIRE - 01/04/15 1034    Right Lower Extremity Lymphedema   Other 7025.48ml   Left Lower Extremity Lymphedema   Other 9479.92   Other limb volume differential (LVD)= 25.89%, L>R                 OT Treatments/Exercises (OP) - 01/04/15 0001    ADLs   ADL Comments Provided intro level Pt and caregiver edu for gradient compressin wrapping and lymphatic pumping ther ex. Pt unable to wrap without max assistance 2/2 difficulty reaching his feet and distal legs   ADL Education Given Yes   Manual Therapy   Manual Therapy Edema management;Compression Bandaging   Compression Bandaging LLE gradient compression wraps applied circumferentially below the knee: toe wrap x1 ,  under cotton stockinett; 8 cm x 1 to foot, + 10 cm x 1, 12 cm x 2 all over .04 x 12 cm Rosidol Soft x 1.5 foam,   from A-D.   Other Manual Therapy BLE volum,etrics- below knees  only                OT Education - 01/04/15 1036    Education provided Yes   Education Details Skilled Pt and caregiver edu for lymphatic pumping ther ex and intro level compression wrapping   Person(s) Educated Patient;Spouse   Methods Explanation;Demonstration;Tactile cues;Verbal cues;Handout   Comprehension Verbalized understanding;Returned demonstration;Need further instruction;Tactile cues required;Verbal cues required             OT Long Term Goals - 12/30/14 0855    OT LONG  TERM GOAL #1   Title Pt able to correctly apply gradient compression wraps to below knee with moderate assistance from caregiver within 2 weeks for optimal limb volume reduction.   Baseline dependent   Time 2   Period Weeks   Status New   OT LONG TERM GOAL #2   Title 20% limb volume reduction to be achhieved bilaterally by end of Intensive Phase CDT   Baseline dependent   Time 12   Period Weeks   Status New   OT LONG TERM GOAL #3   Title Pt mod assist and at least 75% compliant with all LE self-care protocols, including simple self-manual lymphatic drainage (MLD), skin care, lymphatic pumping ther ex, and donning/ doffing progression garments.   Baseline dependent   Time 12   Period Weeks   Status New   OT LONG TERM GOAL #4   Title Pt to remain infection free to limit non healing wounds/ leg ulcers, infection and LE progression.   Baseline dependent   Time 12   Period Weeks   Status On-going   OT LONG TERM GOAL #5   Title During Management Phase CDT Pt to sustain limb volume reductions achieved during Intensive Phase CDT within 5% utilizing LE self care protocols.   Baseline dependent   Time 6   Period Months   Status New               Plan - 01/04/15 1207    Clinical Impression Statement Pt participation in OT for CDT was limited by pain and fatigue. BLE comparative limb volumetrics reveal a significant 25.89% limb volume differential (LVD), L>R. After skilled education for Lymphedema (LE) self care, Pt able to perform BLE lymphatic pumping ther ex with verbal and tactile cues. He verbalized rational for compression wraps and ther ex, but did not participate in wrapping.    Pt will benefit from skilled therapeutic intervention in order to improve on the following deficits (Retired) Decreased skin integrity;Decreased knowledge of precautions;Impaired perceived functional ability;Decreased activity tolerance;Decreased knowledge of use of DME;Decreased mobility;Difficulty  walking;Impaired sensation;Obesity;Decreased range of motion;Increased edema;Pain   Rehab Potential Good   OT Treatment/Interventions Self-care/ADL training;Therapeutic exercise;Patient/family education;Manual Therapy;Manual lymph drainage;DME and/or AE instruction;Compression bandaging;Therapeutic activities   Plan Cont LE self care edu and practice . Once Pt and spouse are able to apply compression wraps using recommended techniques, we'll begin LLE manual lymphatic drainage (MLD) Cont to monitior skin closely.   OT Home Exercise Plan 10 reps each ther ex in order, 2 x daily-bilaterally. See Pt Instructions.   Consulted and Agree with Plan of Care Patient;Family member/caregiver        Problem List Patient Active Problem List  Diagnosis Date Noted  . Peripheral vascular disease, unspecified 09/29/2013   Andrey Spearman, MS, OTR/L, CLT-LANA 01/04/2015 12:13 PM  Ansel Bong 01/04/2015, 12:13 PM  [place] MAIN [facility] SERVICES [address] [place], [place], [address] Phone: [phone]   Fax:  [phone]

## 2015-01-04 NOTE — Patient Instructions (Signed)

## 2015-01-08 ENCOUNTER — Ambulatory Visit: Payer: Medicare Other | Admitting: Occupational Therapy

## 2015-01-08 DIAGNOSIS — I890 Lymphedema, not elsewhere classified: Secondary | ICD-10-CM

## 2015-01-08 NOTE — Patient Instructions (Signed)
Practice wrpas over the weekend

## 2015-01-08 NOTE — Therapy (Signed)
[place] MAIN [facility] SERVICES [address] [place], [place], [address] Phone: [phone]   Fax:  [phone]  Occupational Therapy Treatment  Patient Details  Name: Adam Merritt MRN: [id number] Date of Birth: 1945/11/07 Referring Provider:  Christin Fudge, MD  Encounter Date: 01/08/2015      OT End of Session - 01/08/15 0902    Visit Number 3   Number of Visits 36   Date for OT Re-Evaluation 03/28/15   OT Start Time 0804   OT Stop Time 0900   OT Time Calculation (min) 56 min   Activity Tolerance Patient tolerated treatment well   Behavior During Therapy [facility] for tasks assessed/performed      Past Medical History  Diagnosis Date  . Obstructive chronic bronchitis without exacerbation   . Insomnia   . Chest pain   . Lumbago   . Herpes zoster without mention of complication   . Neuropathy in diabetes   . Hypertension   . Hyperlipidemia   . Diabetes mellitus without complication   . Coronary artery disease   . Varicose veins   . Esophageal reflux   . Other malaise and fatigue   . Anemia   . CAD (coronary artery disease)   . Stroke     Past Surgical History  Procedure Laterality Date  . Coronary artery bypass graft    . Cholecystectomy      There were no vitals filed for this visit.  Visit Diagnosis:  Lymphedema      Subjective Assessment - 01/08/15 0811    Subjective  Pt presents for OT visit 3 for CDT to BLE lymphedema (LE)  2/2 to periferal vascular disease and obesity, L>R, w/ onset > 20 years Pt reports he tolerated compression wrap during visit interval without difficulty. Pt reports wraps fell down yesterday and he finally took it off.   Patient is accompained by: Family member   Pertinent History DM precautions, Carotid artery precautions, diabetic neuropathy, hx vascular ulcers, hx keflex, OA, no hx DVT   Limitations difficulty walking, altered sensation, chronic leg pain,    Patient Stated Goals decrease  swelling,, limit infection, prevents chronic non healing wounds   Currently in Pain? No/denies                      OT Treatments/Exercises (OP) - 01/08/15 0001    ADLs   ADL Comments Pt and caregiver edu for gradient compression . Caregiver needed max A w/ VC and PC to apply toe wraps x 2 using correct techniques.   ADL Education Given Yes   Manual Therapy   Manual Therapy Edema management;Compression Bandaging   Edema Management slight viusible reduction on LLE limb volume today   Compression Bandaging LLE gradient compression wraps applied circumferentially below the knee: toe wrap x1 ,  under cotton stockinett; 8 cm x 1 to foot, + 10 cm x 1, 12 cm x 2 all over .04 x 12 cm Rosidol Soft x 1.5 foam,   from A-D.   Other Manual Therapy skin care w/ low pH lotion to BLE                OT Education - 01/08/15 0902    Education provided Yes   Education Details Pt and caregiver edu for gradient compression . Caregiver needed max A w/ VC and PC to apply toe wraps x 2 using correct techniques.   Person(s) Educated Patient;Spouse   Methods Explanation;Demonstration;Tactile cues;Verbal  cues;Handout   Comprehension Verbalized understanding;Returned demonstration;Verbal cues required;Tactile cues required;Need further instruction             OT Long Term Goals - 12/30/14 0855    OT LONG TERM GOAL #1   Title Pt able to correctly apply gradient compression wraps to below knee with moderate assistance from caregiver within 2 weeks for optimal limb volume reduction.   Baseline dependent   Time 2   Period Weeks   Status New   OT LONG TERM GOAL #2   Title 20% limb volume reduction to be achhieved bilaterally by end of Intensive Phase CDT   Baseline dependent   Time 12   Period Weeks   Status New   OT LONG TERM GOAL #3   Title Pt mod assist and at least 75% compliant with all LE self-care protocols, including simple self-manual lymphatic drainage (MLD), skin care,  lymphatic pumping ther ex, and donning/ doffing progression garments.   Baseline dependent   Time 12   Period Weeks   Status New   OT LONG TERM GOAL #4   Title Pt to remain infection free to limit non healing wounds/ leg ulcers, infection and LE progression.   Baseline dependent   Time 12   Period Weeks   Status On-going   OT LONG TERM GOAL #5   Title During Management Phase CDT Pt to sustain limb volume reductions achieved during Intensive Phase CDT within 5% utilizing LE self care protocols.   Baseline dependent   Time 6   Period Months   Status New               Plan - 01/08/15 1028    Clinical Impression Statement Pt tolerating compression wraps during last visit interval without difficulty. Spouse is eager to learn compression wrapping and fully participated in session today. Pt was able to direct her to adjust application . We'll continue next session w/ emphasis on skilled edu and commence MLD once those skills are mastered to ensure optimal continuity w/ LE self care at home between sessions.   Pt will benefit from skilled therapeutic intervention in order to improve on the following deficits (Retired) Decreased skin integrity;Decreased knowledge of precautions;Impaired perceived functional ability;Decreased activity tolerance;Decreased knowledge of use of DME;Decreased mobility;Difficulty walking;Impaired sensation;Obesity;Decreased range of motion;Increased edema;Pain   Rehab Potential Good   OT Treatment/Interventions Self-care/ADL training;Therapeutic exercise;Patient/family education;Manual Therapy;Manual lymph drainage;DME and/or AE instruction;Compression bandaging;Therapeutic activities   OT Home Exercise Plan 10 reps each ther ex in order, 2 x daily-bilaterally. See Pt Instructions.   Consulted and Agree with Plan of Care Patient;Family member/caregiver        Problem List Patient Active Problem List   Diagnosis Date Noted  . Peripheral vascular disease,  unspecified 09/29/2013  Andrey Spearman, MS, OTR/L, CLT-LANA 01/08/2015 10:32 AM    Ansel Bong 01/08/2015, 10:32 AM  [place] MAIN [facility] SERVICES [address] [place], [place], [address] Phone: [phone]   Fax:  [phone]

## 2015-01-11 ENCOUNTER — Encounter: Payer: Medicare Other | Admitting: Occupational Therapy

## 2015-01-13 ENCOUNTER — Ambulatory Visit: Payer: Medicare Other | Admitting: Occupational Therapy

## 2015-01-13 DIAGNOSIS — I890 Lymphedema, not elsewhere classified: Secondary | ICD-10-CM | POA: Diagnosis not present

## 2015-01-13 NOTE — Therapy (Signed)
[place] MAIN [facility] SERVICES [address] [place], [place], [address] Phone: [phone]   Fax:  [phone]  Occupational Therapy Treatment  Patient Details  Name: Adam Merritt MRN: [id number] Date of Birth: May 16, 1946 Referring Provider:  Christin Fudge, MD  Encounter Date: 01/13/2015      OT End of Session - 01/13/15 1221    Visit Number 4   Number of Visits 36   Date for OT Re-Evaluation 03/28/15   OT Start Time 1031   OT Stop Time 1140   OT Time Calculation (min) 69 min   Equipment Utilized During Treatment compression wraps and supplies, Eucerine lotion      Past Medical History  Diagnosis Date  . Obstructive chronic bronchitis without exacerbation   . Insomnia   . Chest pain   . Lumbago   . Herpes zoster without mention of complication   . Neuropathy in diabetes   . Hypertension   . Hyperlipidemia   . Diabetes mellitus without complication   . Coronary artery disease   . Varicose veins   . Esophageal reflux   . Other malaise and fatigue   . Anemia   . CAD (coronary artery disease)   . Stroke     Past Surgical History  Procedure Laterality Date  . Coronary artery bypass graft    . Cholecystectomy      There were no vitals filed for this visit.  Visit Diagnosis:  Lymphedema      Subjective Assessment - 01/13/15 1033    Subjective  Pt presents for OT visit 4 for CDT to BLE lymphedema (LE)  2/2 to periferal vascular disease and obesity, L>R, w/ onset > 20 years Pt reports he did not let his spouse practice compression wrapping during visit interval b/c :his legs hurt and he didn't want to be messed with."  We had a lengthy discussion during skilled Pt and CG edu portion of visit re the rational and importance of daily bandage changes to limit infection risk  and formation of non-healing wounds, and to optimize interstitial compression to facilitate optimal decomgestion Pt agreed to assist his wife with practice..   Patient is accompained by: Family member   Limitations difficulty walking, decreased B/I ADL performance, limited functional mobility, poor skin condition   Repetition Increases Symptoms   Patient Stated Goals improve legs so I can walk better and so I dont lose a leg   Currently in Pain? No/denies                      OT Treatments/Exercises (OP) - 01/13/15 0001    ADLs   ADL Comments Pt and CG edu for LE self care: compression wrapping, skin care, ther ex and simple self MLD. Pt and CG edu for adapted ADL equipment, including sock donners and long handled sponge   ADL Education Given Yes   Manual Therapy   Manual Therapy Edema management;Compression Bandaging   Compression Bandaging LLE gradient compression wraps applied circumferentially below the knee: toe wrap x1 ,  under cotton stockinett; 8 cm x 1 to foot, + 10 cm x 1, 12 cm x 2 all over .04 x 12 cm Rosidol Soft x 1.5 foam,   from A-D.   Other Manual Therapy skin care w/ low pH lotion to BLE                OT Education - 01/13/15 1220    Education provided Yes  Education Details Pt and CG edu for LE self care: compression wrapping, skin care, ther ex and simple self MLD. Pt and CG edu for adapted ADL equipment, including sock donners and long handled sponge. CG's learning is somewhat delayed. Pt did a good job of verbally cuing her w/ bandaging sequence today.   Person(s) Educated Patient;Spouse   Methods Explanation;Demonstration;Tactile cues;Verbal cues;Handout   Comprehension Verbalized understanding;Returned demonstration;Verbal cues required;Tactile cues required;Need further instruction             OT Long Term Goals - 12/30/14 0855    OT LONG TERM GOAL #1   Title Pt able to correctly apply gradient compression wraps to below knee with moderate assistance from caregiver within 2 weeks for optimal limb volume reduction.   Baseline dependent   Time 2   Period Weeks   Status New   OT LONG TERM  GOAL #2   Title 20% limb volume reduction to be achhieved bilaterally by end of Intensive Phase CDT   Baseline dependent   Time 12   Period Weeks   Status New   OT LONG TERM GOAL #3   Title Pt mod assist and at least 75% compliant with all LE self-care protocols, including simple self-manual lymphatic drainage (MLD), skin care, lymphatic pumping ther ex, and donning/ doffing progression garments.   Baseline dependent   Time 12   Period Weeks   Status New   OT LONG TERM GOAL #4   Title Pt to remain infection free to limit non healing wounds/ leg ulcers, infection and LE progression.   Baseline dependent   Time 12   Period Weeks   Status On-going   OT LONG TERM GOAL #5   Title During Management Phase CDT Pt to sustain limb volume reductions achieved during Intensive Phase CDT within 5% utilizing LE self care protocols.   Baseline dependent   Time 6   Period Months   Status New               Plan - 01/13/15 1222    Clinical Impression Statement Pt demonstrates slow progress towards learning LE self care thus far. Pt and caregiver benefit from repetitions, hands on learning style and from clear explanation of rational for each component of Intensive Phase CDT. Cont as per POC. Hope next session is last for teaching wrapping   so we can move on to MLD and extra skin care.   Pt will benefit from skilled therapeutic intervention in order to improve on the following deficits (Retired) Decreased skin integrity;Decreased knowledge of precautions;Impaired perceived functional ability;Decreased activity tolerance;Decreased knowledge of use of DME;Decreased mobility;Difficulty walking;Impaired sensation;Obesity;Decreased range of motion;Increased edema;Pain   Rehab Potential Good   OT Frequency 3x / week   OT Duration 12 weeks   OT Treatment/Interventions Self-care/ADL training;Therapeutic exercise;Patient/family education;Manual Therapy;Manual lymph drainage;DME and/or AE  instruction;Compression bandaging;Therapeutic activities   OT Home Exercise Plan 10 reps each ther ex in order, 2 x daily-bilaterally. See Pt Instructions.   Consulted and Agree with Plan of Care Patient;Family member/caregiver        Problem List Patient Active Problem List   Diagnosis Date Noted  . Peripheral vascular disease, unspecified 09/29/2013   Andrey Spearman, MS, OTR/L, CLT-LANA 01/13/2015 12:25 PM  Ansel Bong 01/13/2015, 12:25 PM  [name] MAIN [facility] SERVICES [address] [place], [place], [address] Phone: [phone]   Fax:  [phone]

## 2015-01-13 NOTE — Patient Instructions (Signed)

## 2015-01-15 ENCOUNTER — Ambulatory Visit: Payer: Medicare Other | Admitting: Occupational Therapy

## 2015-01-15 DIAGNOSIS — I890 Lymphedema, not elsewhere classified: Secondary | ICD-10-CM

## 2015-01-15 NOTE — Therapy (Signed)
[place] MAIN [facility] SERVICES [address] [place], [place], [address] Phone: [phone]   Fax:  [phone]  Occupational Therapy Treatment  Patient Details  Name: Adam Merritt MRN: [id number] Date of Birth: [birth year] Referring Provider:  Christin Fudge, MD  Encounter Date: 01/15/2015      OT End of Session - 01/15/15 0906    Visit Number 5   Number of Visits 36   Date for OT Re-Evaluation 03/28/15   OT Start Time 0805   OT Stop Time 0900   OT Time Calculation (min) 55 min   Equipment Utilized During Treatment compression wraps and supplies, Eucerine lotion   Activity Tolerance Patient tolerated treatment well;Patient limited by pain   Behavior During Therapy [facility] for tasks assessed/performed      Past Medical History  Diagnosis Date  . Obstructive chronic bronchitis without exacerbation   . Insomnia   . Chest pain   . Lumbago   . Herpes zoster without mention of complication   . Neuropathy in diabetes   . Hypertension   . Hyperlipidemia   . Diabetes mellitus without complication   . Coronary artery disease   . Varicose veins   . Esophageal reflux   . Other malaise and fatigue   . Anemia   . CAD (coronary artery disease)   . Stroke     Past Surgical History  Procedure Laterality Date  . Coronary artery bypass graft    . Cholecystectomy      There were no vitals filed for this visit.  Visit Diagnosis:  Lymphedema      Subjective Assessment - 01/15/15 0834    Subjective  Pt presents for OT visit 5 for CDT to BLE lymphedema (LE)  2/2 to periferal vascular disease and obesity, L>R, w/ onset > 20 years. Pt and spouse collaborated to rewrap bandages during visit interval. Pt and spouse are feeling more confident with compression wrapping between visits.   Patient is accompained by: Family member   Pertinent History DM precautions, Carotid artery precautions, diabetic neuropathy, hx vascular ulcers, hx keflex, OA, no hx  DVT   Limitations difficulty walking, decreased B/I ADL performance, limited functional mobility, poor skin condition   Repetition Increases Symptoms   Patient Stated Goals improve legs so I can walk better and so I dont lose a leg   Currently in Pain? Yes   Pain Score 3    Pain Location Leg   Pain Orientation Right;Left   Pain Descriptors / Indicators Aching   Pain Type Chronic pain   Pain Onset More than a month ago   Pain Frequency Constant   Aggravating Factors  standing, walking   Pain Relieving Factors compression wrapping, elevation   Effect of Pain on Daily Activities pain and BLE swelling limits ability to perform B/I ADLs, productive activities, liezure pursuits and social participation             LYMPHEDEMA/ONCOLOGY QUESTIONNAIRE - 01/15/15 0901    Left Lower Extremity Lymphedema   Other LLE limb volume below knee=8867.80 ml, decrease6.46% since 8/1/16d by    Other LVD=20.78%, L>R                 OT Treatments/Exercises (OP) - 01/15/15 0001    ADLs   ADL Comments see Pt EDU section   ADL Education Given Yes   Manual Therapy   Manual Therapy Edema management;Compression Bandaging   Edema Management slight viusible reduction on LLE limb volume today  Compression Bandaging LLE gradient compression wraps applied circumferentially below the knee: toe wrap x1 ,  under cotton stockinett; 8 cm x 1 to foot, + 10 cm x 1, 12 cm x 2 all over .04 x 12 cm Rosidol Soft x 1.5 foam,   from A-D.   Other Manual Therapy skin care w/ low pH lotion to LLE                OT Education - 01/15/15 1145    Education provided Yes             OT Long Term Goals - 12/30/14 0855    OT LONG TERM GOAL #1   Title Pt able to correctly apply gradient compression wraps to below knee with moderate assistance from caregiver within 2 weeks for optimal limb volume reduction.   Baseline dependent   Time 2   Period Weeks   Status New   OT LONG TERM GOAL #2   Title 20%  limb volume reduction to be achhieved bilaterally by end of Intensive Phase CDT   Baseline dependent   Time 12   Period Weeks   Status New   OT LONG TERM GOAL #3   Title Pt mod assist and at least 75% compliant with all LE self-care protocols, including simple self-manual lymphatic drainage (MLD), skin care, lymphatic pumping ther ex, and donning/ doffing progression garments.   Baseline dependent   Time 12   Period Weeks   Status New   OT LONG TERM GOAL #4   Title Pt to remain infection free to limit non healing wounds/ leg ulcers, infection and LE progression.   Baseline dependent   Time 12   Period Weeks   Status On-going   OT LONG TERM GOAL #5   Title During Management Phase CDT Pt to sustain limb volume reductions achieved during Intensive Phase CDT within 5% utilizing LE self care protocols.   Baseline dependent   Time 6   Period Months   Status New               Plan - 01/15/15 1146    Clinical Impression Statement Pt arrived w/ wraps in place. Spouse and Pt rewrapped together once during visit interval as directed with fair results. Continued practice will improve technique. Comparative LLE limb volumetrics reveal 6.46% limb volume reduction in the LLE below the knee since initally measured and calculated on  01/15/15. LVD is decreased  to 20.78%   from 25.89%. Pt is tolerating CDT without difficulty and is engaged throughout Rx sessions.. Cont as per POC.   Pt will benefit from skilled therapeutic intervention in order to improve on the following deficits (Retired) Decreased skin integrity;Decreased knowledge of precautions;Impaired perceived functional ability;Decreased activity tolerance;Decreased knowledge of use of DME;Decreased mobility;Difficulty walking;Impaired sensation;Obesity;Decreased range of motion;Increased edema;Pain   Rehab Potential Good   OT Frequency 3x / week   OT Duration 12 weeks   OT Treatment/Interventions Self-care/ADL training;Therapeutic  exercise;Patient/family education;Manual Therapy;Manual lymph drainage;DME and/or AE instruction;Compression bandaging;Therapeutic activities   OT Home Exercise Plan 10 reps each ther ex in order, 2 x daily-bilaterally. See Pt Instructions.   Consulted and Agree with Plan of Care Patient;Family member/caregiver        Problem List Patient Active Problem List   Diagnosis Date Noted  . Peripheral vascular disease, unspecified 09/29/2013   Andrey Spearman, MS, OTR/L, CLT-LANA 01/15/2015 11:51 AM   Ansel Bong 01/15/2015, 11:51 AM  [place] MAIN  [place], [place], [address] Phone: [phone]   Fax:  [phone]

## 2015-01-15 NOTE — Patient Instructions (Signed)

## 2015-01-18 ENCOUNTER — Encounter: Payer: Medicare Other | Admitting: Occupational Therapy

## 2015-01-19 ENCOUNTER — Ambulatory Visit: Payer: Medicare Other | Admitting: Occupational Therapy

## 2015-01-19 DIAGNOSIS — I890 Lymphedema, not elsewhere classified: Secondary | ICD-10-CM | POA: Diagnosis not present

## 2015-01-19 NOTE — Therapy (Signed)
[place] MAIN [facility] SERVICES [address] [place], [place], [address] Phone: [phone]   Fax:  [phone]  Occupational Therapy Treatment  Patient Details  Name: Adam Merritt MRN: [id number] Date of Birth: 11/04/1945 Referring Provider:  Christin Fudge, MD  Encounter Date: 01/19/2015      OT End of Session - 01/19/15 0918    Visit Number 6   Number of Visits 36   Date for OT Re-Evaluation 03/28/15   OT Start Time 0822   OT Stop Time 0855   OT Time Calculation (min) 33 min   Equipment Utilized During Treatment compression wraps and supplies, Eucerine lotion   Activity Tolerance Patient limited by fatigue;Patient limited by lethargy   Behavior During Therapy Flat affect      Past Medical History  Diagnosis Date  . Obstructive chronic bronchitis without exacerbation   . Insomnia   . Chest pain   . Lumbago   . Herpes zoster without mention of complication   . Neuropathy in diabetes   . Hypertension   . Hyperlipidemia   . Diabetes mellitus without complication   . Coronary artery disease   . Varicose veins   . Esophageal reflux   . Other malaise and fatigue   . Anemia   . CAD (coronary artery disease)   . Stroke     Past Surgical History  Procedure Laterality Date  . Coronary artery bypass graft    . Cholecystectomy      There were no vitals filed for this visit.  Visit Diagnosis:  Lymphedema      Subjective Assessment - 01/19/15 0911    Subjective  Pt presents for OT visit 6 for CDT to BLE lymphedema (LE)  2/2 to periferal vascular disease and obesity, L>R, w/ onset > 20 years. Pt states, "I'm not good today.I don't feel good." Spouse reports Pt removed wraps 2/2 foot and distal leg pain at about 11 last night. Pt is very lethargic  and falling asleep sitting upright throughout visit. Visit is abbreviated today to maximize participation and to limit discomfort.    Patient is accompained by: Family member   Pertinent  History DM precautions, Carotid artery precautions, diabetic neuropathy, hx vascular ulcers, hx keflex, OA, no hx DVT   Limitations difficulty walking, decreased B/I ADL performance, limited functional mobility, poor skin condition   Repetition Increases Symptoms   Patient Stated Goals improve legs so I can walk better and so I dont lose a leg   Pain Score Asleep   Pain Location Foot   Pain Orientation Right;Left   Pain Type Neuropathic pain;Chronic pain   Aggravating Factors  standing, walking, compression too tight   Effect of Pain on Daily Activities see eval                      OT Treatments/Exercises (OP) - 01/19/15 0001    ADLs   ADL Comments see Pt EDU- no skilled ADL training todaty   Manual Therapy   Manual Therapy Edema management;Compression Bandaging   Compression Bandaging LLE gradient compression wraps applied circumferentially below the knee: toe wrap x1 ,  under cotton stockinett; 8 cm x 1 to foot, + 10 cm x 1, 12 cm x 2 all over .04 x 12 cm Rosidol Soft x 1.5 foam,   from A-D.   Other Manual Therapy skin care w/ low pH lotion to LLE  OT Education - 01/19/15 [phone]    Education provided No   Education Details Pt too sleepy to participate in LE self care training today. Encouraged Pt to utilize rolling walker at home and in community since he appears moderately unsteady on his feet today. Pt declines walker.   Person(s) Educated Patient;Spouse   Methods Explanation   Comprehension Verbalized understanding;Need further instruction             OT Long Term Goals - 12/30/14 0855    OT LONG TERM GOAL #1   Title Pt able to correctly apply gradient compression wraps to below knee with moderate assistance from caregiver within 2 weeks for optimal limb volume reduction.   Baseline dependent   Time 2   Period Weeks   Status New   OT LONG TERM GOAL #2   Title 20% limb volume reduction to be achhieved bilaterally by end of Intensive  Phase CDT   Baseline dependent   Time 12   Period Weeks   Status New   OT LONG TERM GOAL #3   Title Pt mod assist and at least 75% compliant with all LE self-care protocols, including simple self-manual lymphatic drainage (MLD), skin care, lymphatic pumping ther ex, and donning/ doffing progression garments.   Baseline dependent   Time 12   Period Weeks   Status New   OT LONG TERM GOAL #4   Title Pt to remain infection free to limit non healing wounds/ leg ulcers, infection and LE progression.   Baseline dependent   Time 12   Period Weeks   Status On-going   OT LONG TERM GOAL #5   Title During Management Phase CDT Pt to sustain limb volume reductions achieved during Intensive Phase CDT within 5% utilizing LE self care protocols.   Baseline dependent   Time 6   Period Months   Status New               Plan - 01/19/15 0919    Clinical Impression Statement Pt participation in CDT very limited tLimb volume reduction visible at last visit appears unchanged today. Skin remains today 2/2 sleepiness, generalized discomfort, and neuropathic pain in deet and distal legs. LLE remains. significantly enlarged, nonpitting, malodorous w/  hyperpigmented, cobblestone-like lesions extending from the malleolus to several inches below the knee. The surrounding area is slightly erythematous and tender. Despite these symptoms, signs of infection are absennt . Pt and spouse in aagreement with plan to leave toes unwrapped today and until next visit while continuing to apply all other wraps in an effort to limit neuropathic pain and tolerance of compression. Pt and spouse in agreement w/ plan to contact vendor to start researching benefits for alternative, adjustable compression knee highs ( circ aid?) and [employer].   Pt will benefit from skilled therapeutic intervention in order to improve on the following deficits (Retired) Decreased skin integrity;Decreased knowledge of precautions;Impaired  perceived functional ability;Decreased activity tolerance;Decreased knowledge of use of DME;Decreased mobility;Difficulty walking;Impaired sensation;Obesity;Decreased range of motion;Increased edema;Pain   Rehab Potential Good   OT Frequency 3x / week   OT Duration 12 weeks   OT Treatment/Interventions Self-care/ADL training;Therapeutic exercise;Patient/family education;Manual Therapy;Manual lymph drainage;DME and/or AE instruction;Compression bandaging;Therapeutic activities   OT Home Exercise Plan 10 reps each ther ex in order, 2 x daily-bilaterally. See Pt Instructions.   Consulted and Agree with Plan of Care Patient;Family member/caregiver        Problem List Patient Active Problem List   Diagnosis Date Noted  .  Peripheral vascular disease, unspecified 09/29/2013   Andrey Spearman, MS, OTR/L, CLT-LANA 01/19/2015 9:32 AM   Ansel Bong 01/19/2015, 9:32 AM  [place] MAIN [facility] SERVICES [address] [place], [place], [address] Phone: [phone]   Fax:  [phone]

## 2015-01-20 DIAGNOSIS — J069 Acute upper respiratory infection, unspecified: Secondary | ICD-10-CM | POA: Diagnosis not present

## 2015-01-20 DIAGNOSIS — M792 Neuralgia and neuritis, unspecified: Secondary | ICD-10-CM | POA: Diagnosis not present

## 2015-01-20 DIAGNOSIS — I739 Peripheral vascular disease, unspecified: Secondary | ICD-10-CM | POA: Diagnosis not present

## 2015-01-20 DIAGNOSIS — M25579 Pain in unspecified ankle and joints of unspecified foot: Secondary | ICD-10-CM | POA: Diagnosis not present

## 2015-01-20 DIAGNOSIS — I10 Essential (primary) hypertension: Secondary | ICD-10-CM | POA: Diagnosis not present

## 2015-01-20 DIAGNOSIS — J449 Chronic obstructive pulmonary disease, unspecified: Secondary | ICD-10-CM | POA: Diagnosis not present

## 2015-01-21 ENCOUNTER — Encounter: Payer: Medicare Other | Admitting: Occupational Therapy

## 2015-01-22 ENCOUNTER — Ambulatory Visit: Payer: Medicare Other | Admitting: Occupational Therapy

## 2015-01-27 ENCOUNTER — Encounter: Payer: Medicare Other | Admitting: Occupational Therapy

## 2015-01-28 ENCOUNTER — Ambulatory Visit: Payer: Medicare Other | Admitting: Occupational Therapy

## 2015-01-28 ENCOUNTER — Encounter: Payer: Self-pay | Admitting: Occupational Therapy

## 2015-01-28 DIAGNOSIS — I890 Lymphedema, not elsewhere classified: Secondary | ICD-10-CM

## 2015-01-28 NOTE — Therapy (Unsigned)
[place] MAIN [facility] SERVICES [address] [place], [place], [address] Phone: [phone]   Fax:  [phone]  January 28, 2015    _0 @  Occupational Therapy Discharge Summary   Patient: Adam Merritt MRN: [id number] Date of Birth: Nov 30, 1945  Diagnosis: Lymphedema  Referring Provider:  Christin Fudge, MD, MD The above patient had been seen in Occupational Therapy for intensive phase Complete Decongestive Therapy (CDT) to address severe, stage 3, BLE lymphedema (LE). He attended 6 of 36 visits, including intial evaluation. He had 2 cancellations 2/2 illness.  The treatment consisted of CDT w/ emphasis to LLE, including extensive patient and caregiver education and ADL training for LE self-care, knee high gradient compression wrapping, skin care, Manual Lymphatic Drainage (MLD), and therapeutic exercise.   The patient demonstrates no improvement in skin condition. Caregiver is able to apply compression wraps with fair technique with moderate assistance and ongoing verbal cues at DC.  Subjective: Pt's spouse declines further OT forCDT per telephone to clinic today, stating that pt is no longer interested in participating in therapy.  Discharge Findings: OT long term goals not met. Condition unchanged. Pt declines further OT for CDT and is discharged from care this date.  Functional Status at Discharge: Pt dependent for all aspects of LE self care. Neuropathic pain, functional mobility and ambulation difficulties are unchanged. Lymphedema is unchanged and condition is likely to progress along with associated functional decline. Pt continues to present with a substantial infection risk and risk of non healing wounds.      OT Long Term Goals - 12/30/14 0855    OT LONG TERM GOAL #1   Title Pt able to correctly apply gradient compression wraps to below knee with moderate assistance from caregiver within 2 weeks for optimal limb volume  reduction.   Baseline dependent   Time 2   Period Weeks   Status New   OT LONG TERM GOAL #2   Title 20% limb volume reduction to be achhieved bilaterally by end of Intensive Phase CDT   Baseline dependent   Time 12   Period Weeks   Status New   OT LONG TERM GOAL #3   Title Pt mod assist and at least 75% compliant with all LE self-care protocols, including simple self-manual lymphatic drainage (MLD), skin care, lymphatic pumping ther ex, and donning/ doffing progression garments.   Baseline dependent   Time 12   Period Weeks   Status New   OT LONG TERM GOAL #4   Title Pt to remain infection free to limit non healing wounds/ leg ulcers, infection and LE progression.   Baseline dependent   Time 12   Period Weeks   Status On-going   OT LONG TERM GOAL #5   Title During Management Phase CDT Pt to sustain limb volume reductions achieved during Intensive Phase CDT within 5% utilizing LE self care protocols.   Baseline dependent   Time 6   Period Months   Status New       Sincerely,  Andrey Spearman, MS, OTR/L, [facility] 01/28/2015 10:26 AM    [place] _1 @  [place], [place], [address] Phone: [phone]   Fax:  [phone]

## 2015-01-29 ENCOUNTER — Encounter: Payer: Medicare Other | Admitting: Occupational Therapy

## 2015-02-01 ENCOUNTER — Encounter: Payer: Medicare Other | Admitting: Occupational Therapy

## 2015-02-04 ENCOUNTER — Encounter: Payer: Medicare Other | Admitting: Occupational Therapy

## 2015-02-05 ENCOUNTER — Encounter: Payer: Medicare Other | Admitting: Occupational Therapy

## 2015-02-09 ENCOUNTER — Encounter: Payer: Medicare Other | Admitting: Occupational Therapy

## 2015-02-10 ENCOUNTER — Encounter: Payer: Medicare Other | Admitting: Occupational Therapy

## 2015-02-12 ENCOUNTER — Observation Stay
Admission: EM | Admit: 2015-02-12 | Discharge: 2015-02-13 | Disposition: A | Discharge status: Home / Self Care | Payer: Medicare Other | Attending: Specialist | Admitting: Specialist

## 2015-02-12 ENCOUNTER — Encounter: Payer: Self-pay | Admitting: Emergency Medicine

## 2015-02-12 ENCOUNTER — Emergency Department: Payer: Medicare Other

## 2015-02-12 ENCOUNTER — Encounter: Payer: Medicare Other | Admitting: Occupational Therapy

## 2015-02-12 DIAGNOSIS — E1140 Type 2 diabetes mellitus with diabetic neuropathy, unspecified: Secondary | ICD-10-CM | POA: Diagnosis not present

## 2015-02-12 DIAGNOSIS — M545 Low back pain: Secondary | ICD-10-CM | POA: Insufficient documentation

## 2015-02-12 DIAGNOSIS — Z7982 Long term (current) use of aspirin: Secondary | ICD-10-CM | POA: Insufficient documentation

## 2015-02-12 DIAGNOSIS — D619 Aplastic anemia, unspecified: Secondary | ICD-10-CM | POA: Insufficient documentation

## 2015-02-12 DIAGNOSIS — R5381 Other malaise: Secondary | ICD-10-CM | POA: Insufficient documentation

## 2015-02-12 DIAGNOSIS — G4733 Obstructive sleep apnea (adult) (pediatric): Secondary | ICD-10-CM | POA: Insufficient documentation

## 2015-02-12 DIAGNOSIS — E785 Hyperlipidemia, unspecified: Secondary | ICD-10-CM | POA: Diagnosis not present

## 2015-02-12 DIAGNOSIS — I517 Cardiomegaly: Secondary | ICD-10-CM | POA: Diagnosis not present

## 2015-02-12 DIAGNOSIS — R200 Anesthesia of skin: Secondary | ICD-10-CM | POA: Diagnosis not present

## 2015-02-12 DIAGNOSIS — Z833 Family history of diabetes mellitus: Secondary | ICD-10-CM | POA: Insufficient documentation

## 2015-02-12 DIAGNOSIS — E119 Type 2 diabetes mellitus without complications: Secondary | ICD-10-CM | POA: Diagnosis not present

## 2015-02-12 DIAGNOSIS — Z8673 Personal history of transient ischemic attack (TIA), and cerebral infarction without residual deficits: Secondary | ICD-10-CM | POA: Insufficient documentation

## 2015-02-12 DIAGNOSIS — F40240 Claustrophobia: Secondary | ICD-10-CM | POA: Diagnosis not present

## 2015-02-12 DIAGNOSIS — Z79899 Other long term (current) drug therapy: Secondary | ICD-10-CM | POA: Insufficient documentation

## 2015-02-12 DIAGNOSIS — Z8249 Family history of ischemic heart disease and other diseases of the circulatory system: Secondary | ICD-10-CM | POA: Diagnosis not present

## 2015-02-12 DIAGNOSIS — Z6839 Body mass index (BMI) 39.0-39.9, adult: Secondary | ICD-10-CM | POA: Insufficient documentation

## 2015-02-12 DIAGNOSIS — R112 Nausea with vomiting, unspecified: Secondary | ICD-10-CM | POA: Insufficient documentation

## 2015-02-12 DIAGNOSIS — Z951 Presence of aortocoronary bypass graft: Secondary | ICD-10-CM | POA: Insufficient documentation

## 2015-02-12 DIAGNOSIS — I739 Peripheral vascular disease, unspecified: Secondary | ICD-10-CM | POA: Insufficient documentation

## 2015-02-12 DIAGNOSIS — K219 Gastro-esophageal reflux disease without esophagitis: Secondary | ICD-10-CM | POA: Diagnosis not present

## 2015-02-12 DIAGNOSIS — I10 Essential (primary) hypertension: Secondary | ICD-10-CM | POA: Diagnosis not present

## 2015-02-12 DIAGNOSIS — Z794 Long term (current) use of insulin: Secondary | ICD-10-CM | POA: Diagnosis not present

## 2015-02-12 DIAGNOSIS — R5383 Other fatigue: Secondary | ICD-10-CM | POA: Diagnosis not present

## 2015-02-12 DIAGNOSIS — R918 Other nonspecific abnormal finding of lung field: Secondary | ICD-10-CM | POA: Diagnosis not present

## 2015-02-12 DIAGNOSIS — Z7951 Long term (current) use of inhaled steroids: Secondary | ICD-10-CM | POA: Insufficient documentation

## 2015-02-12 DIAGNOSIS — R29810 Facial weakness: Secondary | ICD-10-CM | POA: Diagnosis not present

## 2015-02-12 DIAGNOSIS — D649 Anemia, unspecified: Secondary | ICD-10-CM | POA: Diagnosis not present

## 2015-02-12 DIAGNOSIS — R51 Headache: Secondary | ICD-10-CM | POA: Insufficient documentation

## 2015-02-12 DIAGNOSIS — J449 Chronic obstructive pulmonary disease, unspecified: Secondary | ICD-10-CM | POA: Diagnosis not present

## 2015-02-12 DIAGNOSIS — G510 Bell's palsy: Secondary | ICD-10-CM | POA: Insufficient documentation

## 2015-02-12 DIAGNOSIS — I69392 Facial weakness following cerebral infarction: Secondary | ICD-10-CM | POA: Insufficient documentation

## 2015-02-12 DIAGNOSIS — R531 Weakness: Secondary | ICD-10-CM | POA: Insufficient documentation

## 2015-02-12 DIAGNOSIS — R079 Chest pain, unspecified: Secondary | ICD-10-CM | POA: Insufficient documentation

## 2015-02-12 DIAGNOSIS — I2510 Atherosclerotic heart disease of native coronary artery without angina pectoris: Secondary | ICD-10-CM | POA: Diagnosis not present

## 2015-02-12 DIAGNOSIS — I8390 Asymptomatic varicose veins of unspecified lower extremity: Secondary | ICD-10-CM | POA: Diagnosis not present

## 2015-02-12 DIAGNOSIS — Z9049 Acquired absence of other specified parts of digestive tract: Secondary | ICD-10-CM | POA: Diagnosis not present

## 2015-02-12 DIAGNOSIS — G4700 Insomnia, unspecified: Secondary | ICD-10-CM | POA: Diagnosis not present

## 2015-02-12 DIAGNOSIS — I639 Cerebral infarction, unspecified: Principal | ICD-10-CM | POA: Insufficient documentation

## 2015-02-12 LAB — COMPREHENSIVE METABOLIC PANEL
ALBUMIN: 3.6 g/dL (ref 3.5–5.0)
ALT: 22 U/L (ref 17–63)
ANION GAP: 7 (ref 5–15)
AST: 29 U/L (ref 15–41)
Alkaline Phosphatase: 85 U/L (ref 38–126)
BILIRUBIN TOTAL: 0.5 mg/dL (ref 0.3–1.2)
BUN: 13 mg/dL (ref 6–20)
CHLORIDE: 104 mmol/L (ref 101–111)
CO2: 26 mmol/L (ref 22–32)
Calcium: 8.9 mg/dL (ref 8.9–10.3)
Creatinine, Ser: 1.49 mg/dL — ABNORMAL HIGH (ref 0.61–1.24)
GFR calc Af Amer: 53 mL/min — ABNORMAL LOW (ref >60)
GFR calc non Af Amer: 46 mL/min — ABNORMAL LOW (ref >60)
GLUCOSE: 142 mg/dL — AB (ref 65–99)
POTASSIUM: 4.3 mmol/L (ref 3.5–5.1)
SODIUM: 137 mmol/L (ref 135–145)
TOTAL PROTEIN: 7.1 g/dL (ref 6.5–8.1)

## 2015-02-12 LAB — DIFFERENTIAL
BASOS PCT: 1 %
Basophils Absolute: 0.1 10*3/uL (ref 0–0.1)
EOS ABS: 0.3 10*3/uL (ref 0–0.7)
EOS PCT: 3 %
Lymphocytes Relative: 14 %
Lymphs Abs: 1.4 10*3/uL (ref 1.0–3.6)
Monocytes Absolute: 0.8 10*3/uL (ref 0.2–1.0)
Monocytes Relative: 8 %
NEUTROS PCT: 74 %
Neutro Abs: 7.7 10*3/uL — ABNORMAL HIGH (ref 1.4–6.5)

## 2015-02-12 LAB — CBC
HCT: 40.2 % (ref 40.0–52.0)
Hemoglobin: 13.8 g/dL (ref 13.0–18.0)
MCH: 31.9 pg (ref 26.0–34.0)
MCHC: 34.3 g/dL (ref 32.0–36.0)
MCV: 93.1 fL (ref 80.0–100.0)
PLATELETS: 148 10*3/uL — AB (ref 150–440)
RBC: 4.32 MIL/uL — ABNORMAL LOW (ref 4.40–5.90)
RDW: 13.3 % (ref 11.5–14.5)
WBC: 10.3 10*3/uL (ref 3.8–10.6)

## 2015-02-12 LAB — PROTIME-INR
INR: 1.02
PROTHROMBIN TIME: 13.6 s (ref 11.4–15.0)

## 2015-02-12 LAB — APTT: APTT: 36 s (ref 24–36)

## 2015-02-12 LAB — GLUCOSE, CAPILLARY: Glucose-Capillary: 153 mg/dL — ABNORMAL HIGH (ref 65–99)

## 2015-02-12 MED ORDER — ONDANSETRON HCL 4 MG/2ML IJ SOLN
4.0000 mg | Freq: Four times a day (QID) | INTRAMUSCULAR | Status: DC | PRN
  Administered 2015-02-13: 10:00:00 4 mg via INTRAVENOUS
  Filled 2015-02-12: qty 2

## 2015-02-12 MED ORDER — GABAPENTIN 100 MG PO CAPS
200.0000 mg | ORAL_CAPSULE | Freq: Three times a day (TID) | ORAL | Status: DC
  Administered 2015-02-12 – 2015-02-13 (×2): 200 mg via ORAL
  Filled 2015-02-12 (×2): qty 2

## 2015-02-12 MED ORDER — ASPIRIN EC 325 MG PO TBEC: 325.0000 mg | DELAYED_RELEASE_TABLET | Freq: Every day | ORAL | Status: DC

## 2015-02-12 MED ORDER — HYDRALAZINE HCL 20 MG/ML IJ SOLN
10.0000 mg | Freq: Four times a day (QID) | INTRAMUSCULAR | Status: DC | PRN
  Administered 2015-02-12 (×2): 10 mg via INTRAVENOUS

## 2015-02-12 MED ORDER — CARVEDILOL 3.125 MG PO TABS
25.0000 mg | ORAL_TABLET | Freq: Two times a day (BID) | ORAL | Status: DC
  Administered 2015-02-13: 25 mg via ORAL
  Filled 2015-02-12: qty 8

## 2015-02-12 MED ORDER — MONTELUKAST SODIUM 10 MG PO TABS: 10.0000 mg | ORAL_TABLET | Freq: Every day | ORAL | Status: DC | PRN

## 2015-02-12 MED ORDER — HYDRALAZINE HCL 25 MG PO TABS
25.0000 mg | ORAL_TABLET | Freq: Three times a day (TID) | ORAL | Status: DC
  Administered 2015-02-12 – 2015-02-13 (×2): 25 mg via ORAL
  Filled 2015-02-12 (×2): qty 1

## 2015-02-12 MED ORDER — DIAZEPAM 5 MG/ML IJ SOLN
5.0000 mg | Freq: Once | INTRAMUSCULAR | Status: AC
  Administered 2015-02-13: 09:00:00 5 mg via INTRAVENOUS
  Filled 2015-02-12: qty 2

## 2015-02-12 MED ORDER — HYDRALAZINE HCL 20 MG/ML IJ SOLN
INTRAMUSCULAR | Status: AC
  Administered 2015-02-12: 10 mg via INTRAVENOUS
  Filled 2015-02-12: qty 1

## 2015-02-12 MED ORDER — LIRAGLUTIDE 18 MG/3ML ~~LOC~~ SOPN: 1.8000 mg | PEN_INJECTOR | Freq: Every day | SUBCUTANEOUS | Status: DC

## 2015-02-12 MED ORDER — INSULIN ASPART 100 UNIT/ML ~~LOC~~ SOLN
0.0000 [IU] | Freq: Three times a day (TID) | SUBCUTANEOUS | Status: DC
  Administered 2015-02-13: 4 [IU] via SUBCUTANEOUS
  Administered 2015-02-13: 3 [IU] via SUBCUTANEOUS
  Filled 2015-02-12: qty 3
  Filled 2015-02-12: qty 4

## 2015-02-12 MED ORDER — CLOBETASOL PROPIONATE 0.05 % EX OINT
1.0000 "application " | TOPICAL_OINTMENT | Freq: Two times a day (BID) | CUTANEOUS | Status: DC
  Filled 2015-02-12: qty 15

## 2015-02-12 MED ORDER — HEPARIN SODIUM (PORCINE) 5000 UNIT/ML IJ SOLN
5000.0000 [IU] | Freq: Three times a day (TID) | INTRAMUSCULAR | Status: DC
  Filled 2015-02-12: qty 1

## 2015-02-12 MED ORDER — ASPIRIN 81 MG PO CHEW
324.0000 mg | CHEWABLE_TABLET | Freq: Once | ORAL | Status: AC
  Administered 2015-02-12: 324 mg via ORAL
  Filled 2015-02-12: qty 4

## 2015-02-12 MED ORDER — ZOLPIDEM TARTRATE 5 MG PO TABS
5.0000 mg | ORAL_TABLET | Freq: Every evening | ORAL | Status: DC | PRN
  Administered 2015-02-12: 5 mg via ORAL
  Filled 2015-02-12: qty 1

## 2015-02-12 MED ORDER — ONDANSETRON HCL 4 MG PO TABS: 4.0000 mg | ORAL_TABLET | Freq: Four times a day (QID) | ORAL | Status: DC | PRN

## 2015-02-12 MED ORDER — IPRATROPIUM-ALBUTEROL 0.5-2.5 (3) MG/3ML IN SOLN: 3.0000 mL | RESPIRATORY_TRACT | Status: DC | PRN

## 2015-02-12 MED ORDER — ACETAMINOPHEN 650 MG RE SUPP: 650.0000 mg | Freq: Four times a day (QID) | RECTAL | Status: DC | PRN

## 2015-02-12 MED ORDER — ADULT MULTIVITAMIN W/MINERALS CH
1.0000 | ORAL_TABLET | Freq: Every day | ORAL | Status: DC
  Administered 2015-02-12 – 2015-02-13 (×2): 1 via ORAL
  Filled 2015-02-12 (×2): qty 1

## 2015-02-12 MED ORDER — ACETAMINOPHEN 325 MG PO TABS: 650.0000 mg | ORAL_TABLET | Freq: Four times a day (QID) | ORAL | Status: DC | PRN

## 2015-02-12 MED ORDER — ALBUTEROL SULFATE (2.5 MG/3ML) 0.083% IN NEBU: 2.5000 mg | INHALATION_SOLUTION | Freq: Four times a day (QID) | RESPIRATORY_TRACT | Status: DC | PRN

## 2015-02-12 MED ORDER — PANTOPRAZOLE SODIUM 40 MG PO TBEC
40.0000 mg | DELAYED_RELEASE_TABLET | Freq: Every day | ORAL | Status: DC
  Administered 2015-02-13: 08:00:00 40 mg via ORAL
  Filled 2015-02-12: qty 1

## 2015-02-12 MED ORDER — ATORVASTATIN CALCIUM 20 MG PO TABS
20.0000 mg | ORAL_TABLET | Freq: Every day | ORAL | Status: DC
  Administered 2015-02-12: 23:00:00 20 mg via ORAL
  Filled 2015-02-12: qty 1

## 2015-02-12 MED ORDER — INSULIN ASPART 100 UNIT/ML ~~LOC~~ SOLN: 0.0000 [IU] | Freq: Every day | SUBCUTANEOUS | Status: DC

## 2015-02-12 MED ORDER — SODIUM CHLORIDE 0.9 % IJ SOLN
3.0000 mL | Freq: Two times a day (BID) | INTRAMUSCULAR | Status: DC
  Administered 2015-02-12 – 2015-02-13 (×2): 3 mL via INTRAVENOUS

## 2015-02-12 MED ORDER — OXYCODONE-ACETAMINOPHEN 7.5-325 MG PO TABS
1.0000 | ORAL_TABLET | Freq: Four times a day (QID) | ORAL | Status: DC | PRN
  Administered 2015-02-12 – 2015-02-13 (×3): 1 via ORAL
  Filled 2015-02-12 (×3): qty 1

## 2015-02-12 NOTE — H&P (Signed)
[facility] [place] NAME: Adam Merritt    MR#:  [id number]  DATE OF BIRTH:  1945/06/29  DATE OF ADMISSION:  02/12/2015  PRIMARY CARE PHYSICIAN: [name], MD   REQUESTING/REFERRING PHYSICIAN: Dr. Larae Grooms  CHIEF COMPLAINT:   Chief Complaint  Patient presents with  . Code Stroke   of right-sided facial droop, left-sided facial numbness.  HISTORY OF PRESENT ILLNESS:  [name]  is a 69 y.o. male with a known history of morbid obesity, COPD, diabetes, hypertension, hyperlipidemia, history of coronary disease status post bypass, who presents to the hospital due to right-sided facial droop and left-sided numbness that began earlier today. Patient denies any upper or lower extremity weakness or numbness. He does admit to a mild headache and some nausea and vomiting 2 days ago. Patient presented to the ER and was noted to be significantly hypertensive with systolic blood pressures in the 190s to 200s. Given his acute neurologic symptoms hospitalist services were contacted further treatment and evaluation.  PAST MEDICAL HISTORY:   Past Medical History  Diagnosis Date  . Obstructive chronic bronchitis without exacerbation   . Insomnia   . Chest pain   . Lumbago   . Herpes zoster without mention of complication   . Neuropathy in diabetes   . Hypertension   . Hyperlipidemia   . Diabetes mellitus without complication   . Coronary artery disease   . Varicose veins   . Esophageal reflux   . Other malaise and fatigue   . Anemia   . CAD (coronary artery disease)   . Stroke     PAST SURGICAL HISTORY:   Past Surgical History  Procedure Laterality Date  . Coronary artery bypass graft    . Cholecystectomy      SOCIAL HISTORY:   Social History  Substance Use Topics  . Smoking status: Never Smoker   . Smokeless tobacco: Never Used  . Alcohol Use: No    FAMILY HISTORY:   Family History  Problem Relation Age of  Onset  . Diabetes Mother   . Hyperlipidemia Mother   . Hypertension Mother   . Diabetes Father   . Hyperlipidemia Father   . Hypertension Father     DRUG ALLERGIES:  No Known Allergies  REVIEW OF SYSTEMS:   Review of Systems  Constitutional: Negative for fever and weight loss.  HENT: Negative for congestion, nosebleeds and tinnitus.   Eyes: Negative for blurred vision, double vision and redness.  Respiratory: Negative for cough, hemoptysis and shortness of breath.   Cardiovascular: Negative for chest pain, orthopnea, leg swelling and PND.  Gastrointestinal: Negative for nausea, vomiting, abdominal pain, diarrhea and melena.  Genitourinary: Negative for dysuria, urgency and hematuria.  Musculoskeletal: Negative for joint pain and falls.  Neurological: Positive for sensory change (left-sided facial numbness) and speech change. Negative for dizziness, tingling, focal weakness, seizures, weakness and headaches.  Endo/Heme/Allergies: Negative for polydipsia. Does not bruise/bleed easily.  Psychiatric/Behavioral: Negative for depression and memory loss. The patient is not nervous/anxious.     MEDICATIONS AT HOME:   Prior to Admission medications   Medication Sig Start Date End Date Taking? Authorizing Provider  albuterol (PROVENTIL HFA;VENTOLIN HFA) 108 (90 BASE) MCG/ACT inhaler Inhale 1 puff into the lungs every 6 (six) hours as needed for wheezing or shortness of breath.   Yes Historical Provider, MD  aspirin EC 325 MG tablet Take 325 mg by mouth at bedtime.   Yes Historical Provider,  MD  atorvastatin (LIPITOR) 40 MG tablet Take 20 mg by mouth at bedtime.    Yes Historical Provider, MD  carvedilol (COREG) 25 MG tablet Take 25 mg by mouth 2 (two) times daily with a meal.   Yes Historical Provider, MD  clobetasol ointment (TEMOVATE) [id number] % Apply 1 application topically 2 (two) times daily. Pt applies to lower legs.   Yes Historical Provider, MD  gabapentin (NEURONTIN) 100 MG capsule  Take 200 mg by mouth 3 (three) times daily.    Yes Historical Provider, MD  hydrALAZINE (APRESOLINE) 25 MG tablet Take 25 mg by mouth 3 (three) times daily.   Yes Historical Provider, MD  ipratropium-albuterol (DUONEB) 0.5-2.5 (3) MG/3ML SOLN Take 3 mLs by nebulization every 4 (four) hours as needed (for shortness of breath).   Yes Historical Provider, MD  Liraglutide (VICTOZA) 18 MG/3ML SOPN Inject 1.8 mg into the skin daily.    Yes Historical Provider, MD  montelukast (SINGULAIR) 10 MG tablet Take 10 mg by mouth daily as needed (for allergies).    Yes Historical Provider, MD  Multiple Vitamin (MULTIVITAMIN WITH MINERALS) TABS tablet Take 1 tablet by mouth daily.   Yes Historical Provider, MD  omeprazole (PRILOSEC) 20 MG capsule Take 20 mg by mouth daily.   Yes Historical Provider, MD  oxyCODONE-acetaminophen (PERCOCET) 7.5-325 MG per tablet Take 1 tablet by mouth 4 (four) times daily as needed for severe pain.   Yes Historical Provider, MD  zolpidem (AMBIEN CR) 12.5 MG CR tablet Take 12.5 mg by mouth at bedtime as needed for sleep.   Yes Historical Provider, MD      VITAL SIGNS:  Blood pressure 196/108, pulse 86, temperature 97.7 F (36.5 C), temperature source Oral, resp. rate 15, height 5\' 5"  (1.651 m), weight 108.863 kg (240 lb), SpO2 94 %.  PHYSICAL EXAMINATION:  Physical Exam  GENERAL:  69 y.o.-year-old morbidly obese patient lying in the bed with no acute distress.  EYES: Pupils equal, round, reactive to light and accommodation. No scleral icterus. Extraocular muscles intact.  HEENT: Head atraumatic, normocephalic. Oropharynx and nasopharynx clear. No oropharyngeal erythema, moist oral mucosa  NECK:  Supple, no jugular venous distention. No thyroid enlargement, no tenderness.  LUNGS: Normal breath sounds bilaterally, no wheezing, rales, rhonchi. No use of accessory muscles of respiration.  CARDIOVASCULAR: S1, S2 RRR. No murmurs, rubs, gallops, clicks.  ABDOMEN: Soft, nontender,  nondistended. Bowel sounds present. No organomegaly or mass.  EXTREMITIES: Chronic lymphedema bilaterally, no cyanosis, or clubbing. + 2 radial pulses b/l.   NEUROLOGIC: Cranial nerves II through XII are intact. No focal Motor or sensory deficits appreciated b/l, globally weak.  Right-sided facial droop.   PSYCHIATRIC: The patient is alert and oriented x 3. Good affect.  SKIN: No obvious rash, lesion, or ulcer.   LABORATORY PANEL:   CBC  Recent Labs Lab 02/12/15 1838  WBC 10.3  HGB 13.8  HCT 40.2  PLT 148*   ------------------------------------------------------------------------------------------------------------------  Chemistries   Recent Labs Lab 02/12/15 1838  NA 137  K 4.3  CL 104  CO2 26  GLUCOSE 142*  BUN 13  CREATININE 1.49*  CALCIUM 8.9  AST 29  ALT 22  ALKPHOS 85  BILITOT 0.5   ------------------------------------------------------------------------------------------------------------------  Cardiac Enzymes No results for input(s): TROPONINI in the last 168 hours. ------------------------------------------------------------------------------------------------------------------  RADIOLOGY:  Dg Chest 1 View  02/12/2015   CLINICAL DATA:  RIGHT-sided facial numbness which began earlier today. Hypertension.  EXAM: CHEST  1 VIEW  COMPARISON:  01/05/2014.  FINDINGS: Cardiomegaly. Low lung volumes. Previous median sternotomy for CABG. No active infiltrates or failure. No effusion or pneumothorax.  IMPRESSION: Stable chest.   Electronically Signed   By: Staci Righter M.D.   On: 02/12/2015 19:37   Ct Head Wo Contrast  02/12/2015   CLINICAL DATA:  Right-sided facial droop and right-sided weakness. Code stroke.  EXAM: CT HEAD WITHOUT CONTRAST  TECHNIQUE: Contiguous axial images were obtained from the base of the skull through the vertex without intravenous contrast.  COMPARISON:  06/29/2013  FINDINGS: Vertebrobasilar dolichoectasia and vascular calcifications  reidentified. No acute hemorrhage, infarct, or mass lesion is identified. No midline shift. Ventricles are normal in size. Orbits and paranasal sinuses are unremarkable. No skull fracture. No midline shift. No acute osseous abnormality. Orbits and paranasal sinuses are unremarkable.  IMPRESSION: No acute intracranial finding. Critical Value/emergent results were called by telephone at the time of interpretation on 02/12/2015 at 6:44 pm to Dr. Larae Grooms , who verbally acknowledged these results.   Electronically Signed   By: Conchita Paris M.D.   On: 02/12/2015 18:43     IMPRESSION AND PLAN:   69 year old male with past medical history of COPD, obstructive sleep apnea, morbid obesity, hypertension, hyperlipidemia, GERD, history of aplastic anemia came into the hospital due to right-sided facial droop and also left-sided facial numbness.  #1 CVA-this is a likely diagnosis given the patient's facial droop. -CT head on admission is negative for any acute pathology. I will get MRI of the brain, carotid duplex, and two-dimensional echocardiogram -Continue aspirin, statin, follow every 4 hours neuro checks. Get a neurology consult. -I will get a speech, physical therapy evaluation.  #2 accelerated hypertension-continue Coreg. We'll start when necessary hydralazine -We will tolerate some element of hypertension given his possible acute CVA.  #3 hyperlipidemia-continue atorvastatin.  #4 GERD-continue Protonix  #5 diabetes-continue sliding scale insulin.  #6 diabetic neuropathy-continue Neurontin.   All the records are reviewed and case discussed with ED provider. Management plans discussed with the patient, family and they are in agreement.  CODE STATUS: Full  TOTAL TIME TAKING CARE OF THIS PATIENT: 45 minutes.    Henreitta Leber M.D on 02/12/2015 at 8:24 PM  Between 7am to 6pm - Pager - [phone]  After 6pm go to [email or web address] - password [email or web address]   Office  [phone]  CC: Primary care physician; [name], MD

## 2015-02-12 NOTE — ED Notes (Signed)
hospitalist in to see pt.

## 2015-02-12 NOTE — Plan of Care (Signed)
Problem: Discharge/Transitional Outcomes Goal: Barriers To Progression Addressed/Resolved Individualization Pt prefers to be called Adam Merritt who lives at home with his wife.  Hx HTN, Hyperlipdemia, DM, Cad controlled by home medications.  Moderate fall risk. Bed alarm on, hourly rounding. Pt shows understanding how to use call system for assistance.

## 2015-02-12 NOTE — Progress Notes (Signed)
   02/12/15 1800  Clinical Encounter Type  Visited With Patient and family together  Visit Type Code  Referral From Nurse  Consult/Referral To Chaplain  Provided pastoral support and presence to patient and spouse for a code stroke.  Pt was responsive.  Pt's wife appeared to be calm and thanked me for coming to visit with them.  No other needs identified.  [place] [phone]

## 2015-02-12 NOTE — ED Notes (Signed)
Report received from amy, rn.

## 2015-02-12 NOTE — ED Notes (Addendum)
Pt brought in by wife from home with left side weakness and facial droop on the right side since 1100am today. Pt has a headache in the back of head.  Pt alert.  Speech slurred.  md at bedside on arrival to treatment room and then pt taken to ct with er tech colton.   Iv started and labsent.  ekg done.  nsr on monitor.

## 2015-02-12 NOTE — ED Notes (Signed)
Dr. Clearnce Hasten notified regarding pt's blood pressure of 196/108. No new orders received.

## 2015-02-12 NOTE — ED Notes (Signed)
Pt to ED with c/o right sided facial dropping and right sided weakness since 1100 this am, pt has obvious right sided facial droop present on exam

## 2015-02-12 NOTE — ED Provider Notes (Signed)
[facility] Emergency Department Provider Note  ____________________________________________  Time seen: Seen upon arrival to the emergency department  I have reviewed the triage vital signs and the nursing notes.   HISTORY  Chief Complaint Code Stroke    HPI Adam Merritt is a 69 y.o. male with a history of hypertension and hyperlipidemia who is presenting today with several days of diarrhea and now right-sided facial droop with left lower extremity weakness. He denies any pain at this time. Says that he noticed the weakness at about 11 AM this morning upon waking.Takes only aspirin for anticoagulation.   Past Medical History  Diagnosis Date  . Obstructive chronic bronchitis without exacerbation   . Insomnia   . Chest pain   . Lumbago   . Herpes zoster without mention of complication   . Neuropathy in diabetes   . Hypertension   . Hyperlipidemia   . Diabetes mellitus without complication   . Coronary artery disease   . Varicose veins   . Esophageal reflux   . Other malaise and fatigue   . Anemia   . CAD (coronary artery disease)   . Stroke     Patient Active Problem List   Diagnosis Date Noted  . Peripheral vascular disease, unspecified 09/29/2013    Past Surgical History  Procedure Laterality Date  . Coronary artery bypass graft    . Cholecystectomy      Current Outpatient Rx  Name  Route  Sig  Dispense  Refill  . aspirin EC 325 MG tablet   Oral   Take 325 mg by mouth at bedtime.         . Multiple Vitamin (MULTIVITAMIN WITH MINERALS) TABS tablet   Oral   Take 1 tablet by mouth daily.         Marland Kitchen atorvastatin (LIPITOR) 40 MG tablet   Oral   Take 40 mg by mouth daily.         . bisacodyl (BISACODYL) 5 MG EC tablet   Oral   Take 5 mg by mouth daily as needed for moderate constipation.         . carvedilol (COREG) 25 MG tablet   Oral   Take 25 mg by mouth 2 (two) times daily with a meal.         . clobetasol  ointment (TEMOVATE) 0.05 %   Topical   Apply 1 application topically 2 (two) times daily. Bilateral lower leg         . Fe Cbn-Fe Gluc-FA-B12-C-DSS (FERRALET 90 PO)   Oral   Take by mouth. Dual-iron  Tab 90-1 -12-50 mg  One Tab daily         . Fluticasone-Salmeterol (ADVAIR DISKUS) 250-50 MCG/DOSE AEPB   Inhalation   Inhale 1 puff into the lungs 2 (two) times daily.         . furosemide (LASIX) 20 MG tablet   Oral   Take 20 mg by mouth.         . gabapentin (NEURONTIN) 100 MG capsule   Oral   Take 100 mg by mouth 3 (three) times daily.         . hydrALAZINE (APRESOLINE) 25 MG tablet   Oral   Take 25 mg by mouth 3 (three) times daily.         Marland Kitchen ibuprofen (ADVIL,MOTRIN) 600 MG tablet   Oral   Take 600 mg by mouth every 6 (six) hours as needed.         Marland Kitchen  Insulin Detemir (LEVEMIR FLEXPEN [place])   Subcutaneous   Inject 100 Units into the skin.         Marland Kitchen ipratropium-albuterol (DUONEB) 0.5-2.5 (3) MG/3ML SOLN   Nebulization   Take 3 mLs by nebulization.         . Liraglutide (VICTOZA) 18 MG/3ML SOPN   Subcutaneous   Inject into the skin.         . montelukast (SINGULAIR) 10 MG tablet   Oral   Take 10 mg by mouth at bedtime.         . OMEPRAZOLE PO   Oral   Take 20 mg by mouth daily. Dr         . oxyCODONE-acetaminophen (PERCOCET/ROXICET) 5-325 MG per tablet   Oral   Take by mouth every 4 (four) hours as needed for severe pain.         Marland Kitchen zolpidem (AMBIEN CR) 12.5 MG CR tablet   Oral   Take 12.5 mg by mouth at bedtime as needed for sleep.           Allergies Review of patient's allergies indicates no known allergies.  Family History  Problem Relation Age of Onset  . Diabetes Mother   . Hyperlipidemia Mother   . Hypertension Mother   . Diabetes Father   . Hyperlipidemia Father   . Hypertension Father     Social History Social History  Substance Use Topics  . Smoking status: Never Smoker   . Smokeless tobacco: Never Used  .  Alcohol Use: No    Review of Systems Constitutional: No fever/chills Eyes: No visual changes. ENT: No sore throat. Cardiovascular: Denies chest pain. Respiratory: Denies shortness of breath. Gastrointestinal: No abdominal pain.  No nausea, no vomiting.  No diarrhea.  No constipation. Genitourinary: Negative for dysuria. Musculoskeletal: Negative for back pain. Skin: Negative for rash. Neurological: Negative for headaches, numbness.  10-point ROS otherwise negative.  ____________________________________________   PHYSICAL EXAM:  VITAL SIGNS: ED Triage Vitals  Enc Vitals Group     BP 02/12/15 1821 183/97 mmHg     Pulse Rate 02/12/15 1821 83     Resp 02/12/15 1821 18     Temp 02/12/15 1821 97.7 F (36.5 C)     Temp Source 02/12/15 1821 Oral     SpO2 02/12/15 1821 97 %     Weight 02/12/15 1821 240 lb (108.863 kg)     Height 02/12/15 1821 5\' 5"  (1.651 m)     Head Cir --      Peak Flow --      Pain Score --      Pain Loc --      Pain Edu? --      Excl. in [place]? --     Constitutional: Alert and oriented. Well appearing and in no acute distress. Eyes: Conjunctivae are normal. PERRL. EOMI. Head: Atraumatic. Nose: No congestion/rhinnorhea. Mouth/Throat: Mucous membranes are moist.  Oropharynx non-erythematous. Neck: No stridor.   Cardiovascular: Normal rate, regular rhythm. Grossly normal heart sounds.  Good peripheral circulation. Respiratory: Normal respiratory effort.  No retractions. Lungs CTAB. Gastrointestinal: Soft and nontender. No distention. No abdominal bruits. No CVA tenderness. Musculoskeletal: No lower extremity tenderness nor edema.  No joint effusions. Neurologic:  Normal speech and language. Right-sided facial droop. Also with weak 3 out of 5 strength the left lower extremity. No ataxia on finger to nose. Skin:  Skin is warm, dry and intact. No rash noted. Psychiatric: Mood and affect are normal. Speech and  behavior are normal.  NIH Stroke Scale  Person  Administering Scale: Doran Stabler  Administer stroke scale items in the order listed. Record performance in each category after each subscale exam. Do not go back and change scores. Follow directions provided for each exam technique. Scores should reflect what the patient does, not what the clinician thinks the patient can do. The clinician should record answers while administering the exam and work quickly. Except where indicated, the patient should not be coached (i.e., repeated requests to patient to make a special effort).   1a  Level of consciousness: 0=alert; keenly responsive  1b. LOC questions:  0=Performs both tasks correctly  1c. LOC commands: 0=Performs both tasks correctly  2.  Best Gaze: 0=normal  3.  Visual: 0=No visual loss  4. Facial Palsy: 2=Partial paralysis (total or near total paralysis of the lower face)  5a.  Motor left arm: 0=No drift, limb holds 90 (or 45) degrees for full 10 seconds  5b.  Motor right arm: 0=No drift, limb holds 90 (or 45) degrees for full 10 seconds  6a. motor left leg: 2=Some effort against gravity, limb cannot get to or maintain (if cured) 90 (or 45) degrees, drifts down to bed, but has some effort against gravity  6b  Motor right leg:  0=No drift, limb holds 90 (or 45) degrees for full 10 seconds  7. Limb Ataxia: 0=Absent  8.  Sensory: 0=Normal; no sensory loss  9. Best Language:  0=No aphasia, normal  10. Dysarthria: 0=Normal  11. Extinction and Inattention: 0=No abnormality  12. Distal motor function: 0=Normal   Total:   4    ____________________________________________   LABS (all labs ordered are listed, but only abnormal results are displayed)  Labs Reviewed  CBC - Abnormal; Notable for the following:    RBC 4.32 (*)    Platelets 148 (*)    All other components within normal limits  DIFFERENTIAL - Abnormal; Notable for the following:    Neutro Abs 7.7 (*)    All other components within normal limits  COMPREHENSIVE METABOLIC  PANEL - Abnormal; Notable for the following:    Glucose, Bld 142 (*)    Creatinine, Ser 1.49 (*)    GFR calc non Af Amer 46 (*)    GFR calc Af Amer 53 (*)    All other components within normal limits  PROTIME-INR  APTT  CBG MONITORING, ED   ____________________________________________  EKG  ED ECG REPORT I, Doran Stabler, the attending physician, personally viewed and interpreted this ECG.   Date: 02/12/2015  EKG Time: 1842  Rate: 86  Rhythm: normal EKG, normal sinus rhythm  Axis: Normal axis  Intervals:none  ST&T Change: No ST segment elevations or depressions. No abnormal T-wave inversion.  ____________________________________________  RADIOLOGY  CAT scan of the brain without acute findings. ____________________________________________   PROCEDURES   ____________________________________________   INITIAL IMPRESSION / ASSESSMENT AND PLAN / ED COURSE  Pertinent labs & imaging results that were available during my care of the patient were reviewed by me and considered in my medical decision making (see chart for details).  ----------------------------------------- 7:27 PM on 02/12/2015 -----------------------------------------  Patient Merritt be admitted to the hospital we'll give aspirin. Signed out to Dr. Verdell Carmine. We'll presume acute CVA. Patient is out of the window for TPA administration. ____________________________________________   FINAL CLINICAL IMPRESSION(S) / ED DIAGNOSES  Acute CVA. Initial visit.    Orbie Pyo, MD 02/12/15 [phone]

## 2015-02-13 ENCOUNTER — Encounter: Payer: Self-pay | Admitting: Radiology

## 2015-02-13 ENCOUNTER — Observation Stay: Payer: Medicare Other

## 2015-02-13 ENCOUNTER — Observation Stay
Admit: 2015-02-13 | Discharge: 2015-02-13 | Disposition: A | Discharge status: Home / Self Care | Payer: Medicare Other | Attending: Specialist | Admitting: Specialist

## 2015-02-13 DIAGNOSIS — I639 Cerebral infarction, unspecified: Secondary | ICD-10-CM | POA: Diagnosis not present

## 2015-02-13 DIAGNOSIS — I63232 Cerebral infarction due to unspecified occlusion or stenosis of left carotid arteries: Secondary | ICD-10-CM | POA: Diagnosis not present

## 2015-02-13 DIAGNOSIS — G510 Bell's palsy: Secondary | ICD-10-CM | POA: Diagnosis not present

## 2015-02-13 DIAGNOSIS — I10 Essential (primary) hypertension: Secondary | ICD-10-CM | POA: Diagnosis not present

## 2015-02-13 DIAGNOSIS — E119 Type 2 diabetes mellitus without complications: Secondary | ICD-10-CM | POA: Diagnosis not present

## 2015-02-13 DIAGNOSIS — I63231 Cerebral infarction due to unspecified occlusion or stenosis of right carotid arteries: Secondary | ICD-10-CM | POA: Diagnosis not present

## 2015-02-13 DIAGNOSIS — R29810 Facial weakness: Secondary | ICD-10-CM | POA: Diagnosis not present

## 2015-02-13 DIAGNOSIS — I638 Other cerebral infarction: Secondary | ICD-10-CM | POA: Diagnosis not present

## 2015-02-13 LAB — CBC
HCT: 37.7 % — ABNORMAL LOW (ref 40.0–52.0)
Hemoglobin: 12.9 g/dL — ABNORMAL LOW (ref 13.0–18.0)
MCH: 32.1 pg (ref 26.0–34.0)
MCHC: 34.4 g/dL (ref 32.0–36.0)
MCV: 93.4 fL (ref 80.0–100.0)
PLATELETS: 133 10*3/uL — AB (ref 150–440)
RBC: 4.03 MIL/uL — ABNORMAL LOW (ref 4.40–5.90)
RDW: 13.1 % (ref 11.5–14.5)
WBC: 9.9 10*3/uL (ref 3.8–10.6)

## 2015-02-13 LAB — BASIC METABOLIC PANEL
Anion gap: 7 (ref 5–15)
BUN: 15 mg/dL (ref 6–20)
CO2: 26 mmol/L (ref 22–32)
CREATININE: 1.47 mg/dL — AB (ref 0.61–1.24)
Calcium: 8.5 mg/dL — ABNORMAL LOW (ref 8.9–10.3)
Chloride: 105 mmol/L (ref 101–111)
GFR calc Af Amer: 54 mL/min — ABNORMAL LOW (ref >60)
GFR, EST NON AFRICAN AMERICAN: 47 mL/min — AB (ref >60)
GLUCOSE: 136 mg/dL — AB (ref 65–99)
Potassium: 3.8 mmol/L (ref 3.5–5.1)
SODIUM: 138 mmol/L (ref 135–145)

## 2015-02-13 LAB — GLUCOSE, CAPILLARY
GLUCOSE-CAPILLARY: 160 mg/dL — AB (ref 65–99)
Glucose-Capillary: 150 mg/dL — ABNORMAL HIGH (ref 65–99)

## 2015-02-13 MED ORDER — CLOBETASOL PROPIONATE 0.05 % EX CREA
TOPICAL_CREAM | Freq: Two times a day (BID) | CUTANEOUS | Status: DC
  Filled 2015-02-13: qty 15

## 2015-02-13 MED ORDER — PREDNISONE 10 MG PO TABS: 10.0000 mg | ORAL_TABLET | Freq: Every day | ORAL | Status: DC

## 2015-02-13 NOTE — Discharge Summary (Signed)
[facility] [place] NAME: Adam Merritt    MR#:  [id number]  DATE OF BIRTH:  July 05, 1945  DATE OF ADMISSION:  02/12/2015 ADMITTING PHYSICIAN: Henreitta Leber, MD  DATE OF DISCHARGE: 02/13/2015  1:53 PM  PRIMARY CARE PHYSICIAN: [name], MD    ADMISSION DIAGNOSIS:  Cerebral infarction due to unspecified mechanism [I63.9]  DISCHARGE DIAGNOSIS:  Active Problems:   CVA (cerebral infarction)  Bell's palsy  SECONDARY DIAGNOSIS:   Past Medical History  Diagnosis Date  . Obstructive chronic bronchitis without exacerbation   . Insomnia   . Chest pain   . Lumbago   . Herpes zoster without mention of complication   . Neuropathy in diabetes   . Hypertension   . Hyperlipidemia   . Diabetes mellitus without complication   . Coronary artery disease   . Varicose veins   . Esophageal reflux   . Other malaise and fatigue   . Anemia   . CAD (coronary artery disease)   . Stroke     HOSPITAL COURSE:   69 year old male with past medical history of COPD, obstructive sleep apnea, morbid obesity, hypertension, hyperlipidemia, GERD, history of aplastic anemia came into the hospital due to right-sided facial droop and also left-sided facial numbness.  #1 CVA ruled out/Bell's palsy-this was a suspected diagnosis given patient's right-sided facial droop.  -CT head on admission is negative for any acute pathology. Patient could not tolerate MRI due to claustrophobia despite getting anxiolytics and therefore repeat CT obtained which was negative for acute CVA. Patient had a carotid duplex which showed no evidence of hemodynamically significant carotid stenosis. -Patient only had ipsilateral right-sided facial weakness and therefore was seen by neurology and this was likely secondary to Bell's palsy rather than acute CVA. -Patient was seen by physical therapy and he did not recommend any services, also seen by speech and he was tolerating by mouth  well. Patient was therefore discharged on oral prednisone taper.  #2 accelerated hypertension-much improved while in the hospital. -continue Coreg, hydralazine.   #3 hyperlipidemia-patient will continue atorvastatin.  #4 GERD-continue Protonix  #5 diabetes-on the hospital patient was maintained on sliding scale insulin but he will resume his Victoza upon discharge  #6 diabetic neuropathy-patient will continue Neurontin.  DISCHARGE CONDITIONS:   Stable  CONSULTS OBTAINED:  Treatment Team:  Leotis Pain, MD  DRUG ALLERGIES:  No Known Allergies  DISCHARGE MEDICATIONS:   Discharge Medication List as of 02/13/2015 12:50 PM    START taking these medications   Details  predniSONE (DELTASONE) 10 MG tablet Take 1 tablet (10 mg total) by mouth daily with breakfast., Starting 02/13/2015, Until Discontinued, Print      CONTINUE these medications which have NOT CHANGED   Details  albuterol (PROVENTIL HFA;VENTOLIN HFA) 108 (90 BASE) MCG/ACT inhaler Inhale 1 puff into the lungs every 6 (six) hours as needed for wheezing or shortness of breath., Until Discontinued, Historical Med    aspirin EC 325 MG tablet Take 325 mg by mouth at bedtime., Until Discontinued, Historical Med    atorvastatin (LIPITOR) 40 MG tablet Take 20 mg by mouth at bedtime. , Until Discontinued, Historical Med    carvedilol (COREG) 25 MG tablet Take 25 mg by mouth 2 (two) times daily with a meal., Until Discontinued, Historical Med    clobetasol ointment (TEMOVATE) [id number] % Apply 1 application topically 2 (two) times daily. Pt applies to lower legs., Until Discontinued, Historical Med    gabapentin (  NEURONTIN) 100 MG capsule Take 200 mg by mouth 3 (three) times daily. , Until Discontinued, Historical Med    hydrALAZINE (APRESOLINE) 25 MG tablet Take 25 mg by mouth 3 (three) times daily., Until Discontinued, Historical Med    ipratropium-albuterol (DUONEB) 0.5-2.5 (3) MG/3ML SOLN Take 3 mLs by nebulization every 4  (four) hours as needed (for shortness of breath)., Until Discontinued, Historical Med    Liraglutide (VICTOZA) 18 MG/3ML SOPN Inject 1.8 mg into the skin daily. , Until Discontinued, Historical Med    montelukast (SINGULAIR) 10 MG tablet Take 10 mg by mouth daily as needed (for allergies). , Until Discontinued, Historical Med    Multiple Vitamin (MULTIVITAMIN WITH MINERALS) TABS tablet Take 1 tablet by mouth daily., Until Discontinued, Historical Med    omeprazole (PRILOSEC) 20 MG capsule Take 20 mg by mouth daily., Until Discontinued, Historical Med    oxyCODONE-acetaminophen (PERCOCET) 7.5-325 MG per tablet Take 1 tablet by mouth 4 (four) times daily as needed for severe pain., Until Discontinued, Historical Med    zolpidem (AMBIEN CR) 12.5 MG CR tablet Take 12.5 mg by mouth at bedtime as needed for sleep., Until Discontinued, Historical Med         DISCHARGE INSTRUCTIONS:   DIET:  Cardiac diet and Diabetic diet  DISCHARGE CONDITION:  Stable  ACTIVITY:  Activity as tolerated  OXYGEN:  Home Oxygen: No.   Oxygen Delivery: room air  DISCHARGE LOCATION:  home   If you experience worsening of your admission symptoms, develop shortness of breath, life threatening emergency, suicidal or homicidal thoughts you must seek medical attention immediately by calling 911 or calling your MD immediately  if symptoms less severe.  You Must read complete instructions/literature along with all the possible adverse reactions/side effects for all the Medicines you take and that have been prescribed to you. Take any new Medicines after you have completely understood and accpet all the possible adverse reactions/side effects.   Please note  You were cared for by a hospitalist during your hospital stay. If you have any questions about your discharge medications or the care you received while you were in the hospital after you are discharged, you can call the unit and asked to speak with the  hospitalist on call if the hospitalist that took care of you is not available. Once you are discharged, your primary care physician will handle any further medical issues. Please note that NO REFILLS for any discharge medications will be authorized once you are discharged, as it is imperative that you return to your primary care physician (or establish a relationship with a primary care physician if you do not have one) for your aftercare needs so that they can reassess your need for medications and monitor your lab values.   DATA REVIEW:   CBC  Recent Labs Lab 02/13/15 0448  WBC 9.9  HGB 12.9*  HCT 37.7*  PLT 133*    Chemistries   Recent Labs Lab 02/12/15 1838 02/13/15 0448  NA 137 138  K 4.3 3.8  CL 104 105  CO2 26 26  GLUCOSE 142* 136*  BUN 13 15  CREATININE 1.49* 1.47*  CALCIUM 8.9 8.5*  AST 29  --   ALT 22  --   ALKPHOS 85  --   BILITOT 0.5  --     Cardiac Enzymes No results for input(s): TROPONINI in the last 168 hours.  RADIOLOGY:  Dg Chest 1 View  02/12/2015   CLINICAL DATA:  RIGHT-sided facial numbness which  began earlier today. Hypertension.  EXAM: CHEST  1 VIEW  COMPARISON:  01/05/2014.  FINDINGS: Cardiomegaly. Low lung volumes. Previous median sternotomy for CABG. No active infiltrates or failure. No effusion or pneumothorax.  IMPRESSION: Stable chest.   Electronically Signed   By: Staci Righter M.D.   On: 02/12/2015 19:37   Ct Head Wo Contrast  02/13/2015   CLINICAL DATA:  RIGHT-sided facial droop and LEFT-sided facial numbness. This began yesterday.  EXAM: CT HEAD WITHOUT CONTRAST  TECHNIQUE: Contiguous axial images were obtained from the base of the skull through the vertex without intravenous contrast.  COMPARISON:  02/12/2015.  FINDINGS: Unchanged mild atrophy and white matter hypoattenuation, age-related. No visible acute stroke or hemorrhage. No mass lesion or hydrocephalus. No extra-axial fluid no CT signs of proximal vascular thrombosis. Calvarium  intact. Vascular calcification. No sinus or mastoid air fluid level.  IMPRESSION: No acute intracranial findings. Stable appearance from yesterday's CT head.   Electronically Signed   By: Staci Righter M.D.   On: 02/13/2015 11:44   Ct Head Wo Contrast  02/12/2015   CLINICAL DATA:  Right-sided facial droop and right-sided weakness. Code stroke.  EXAM: CT HEAD WITHOUT CONTRAST  TECHNIQUE: Contiguous axial images were obtained from the base of the skull through the vertex without intravenous contrast.  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 No acute hemorrhage, infarct, or mass lesion is identified. No midline shift. Ventricles are normal in size. Orbits and paranasal sinuses are unremarkable. No skull fracture. No midline shift. No acute osseous abnormality. Orbits and paranasal sinuses are unremarkable.  IMPRESSION: No acute intracranial finding. Critical Value/emergent results were called by telephone at the time of interpretation on 02/12/2015 at 6:44 pm to Dr. Larae Grooms , who verbally acknowledged these results.   Electronically Signed   By: Conchita Paris M.D.   On: 02/12/2015 18:43   US Carotid Bilateral  02/13/2015   CLINICAL DATA:  Cerebrovascular accident.  EXAM: BILATERAL CAROTID DUPLEX ULTRASOUND  TECHNIQUE: Pearline Cables scale imaging, color Doppler and duplex ultrasound were performed of bilateral carotid and vertebral arteries in the neck.  COMPARISON:  None.  FINDINGS: Criteria: Quantification of carotid stenosis is based on velocity parameters that correlate the residual internal carotid diameter with NASCET-based stenosis levels, using the diameter of the distal internal carotid lumen as the denominator for stenosis measurement.  The following velocity measurements were obtained:  RIGHT  ICA:  124/33 cm/sec  CCA:  [id number] cm/sec  SYSTOLIC ICA/CCA RATIO:  1.5  DIASTOLIC ICA/CCA RATIO:  2.1  ECA:  185 cm/sec  LEFT  ICA:  139/82 cm/sec   CCA:  123XX123 cm/sec  SYSTOLIC ICA/CCA RATIO:  1.2  DIASTOLIC ICA/CCA RATIO:  1.2  ECA:  117 cm/sec  RIGHT CAROTID ARTERY: Mild eccentric partially calcified plaque is noted in the right carotid bulb and proximal right internal carotid artery consistent with less than 50% diameter stenosis based on ultrasound and Doppler criteria.  RIGHT VERTEBRAL ARTERY:  Antegrade flow is noted.  LEFT CAROTID ARTERY: Irregular and partially calcified plaque formation is noted in the proximal left internal carotid artery consistent with 50-69% stenosis based on Doppler and ultrasound criteria.  LEFT VERTEBRAL ARTERY:  Antegrade flow is noted.  IMPRESSION: Mild eccentric partially calcified plaque noted in right carotid bulb and proximal right internal carotid artery consistent with less than 50% diameter stenosis based on ultrasound and Doppler criteria.  Irregular and partially calcified plaque formation is noted in the proximal left internal carotid  artery consistent with 50-69% stenosis based on Doppler and ultrasound criteria.   Electronically Signed   By: Marijo Conception, M.D.   On: 02/13/2015 14:05      Management plans discussed with the patient, family and they are in agreement.  CODE STATUS:     Code Status Orders        Start     Ordered   02/12/15 2157  Full code   Continuous     02/12/15 2156      TOTAL TIME TAKING CARE OF THIS PATIENT: 40 minutes.    Henreitta Leber M.D on 02/13/2015 at 2:35 PM  Between 7am to 6pm - Pager - [phone]  After 6pm go to [email or web address] - password [email or web address] [facility]  Office  [phone]  CC: Primary care physician; [name], MD

## 2015-02-13 NOTE — Evaluation (Signed)
  Speech Language Pathology Evaluation Patient Details Name: Adam Merritt MRN: [id number] DOB: 1945/12/02 Today's Date: 02/13/2015 Time:  -1200     Problem List:  Patient Active Problem List   Diagnosis Date Noted  . CVA (cerebral infarction) 02/12/2015  . Peripheral vascular disease, unspecified 09/29/2013   Past Medical History:  Past Medical History  Diagnosis Date  . Obstructive chronic bronchitis without exacerbation   . Insomnia   . Chest pain   . Lumbago   . Herpes zoster without mention of complication   . Neuropathy in diabetes   . Hypertension   . Hyperlipidemia   . Diabetes mellitus without complication   . Coronary artery disease   . Varicose veins   . Esophageal reflux   . Other malaise and fatigue   . Anemia   . CAD (coronary artery disease)   . Stroke    Past Surgical History:  Past Surgical History  Procedure Laterality Date  . Coronary artery bypass graft    . Cholecystectomy     HPI:    Adam Merritt is an 69 y.o. male . male with a known history of morbid obesity, COPD, diabetes, hypertension, hyperlipidemia, history of coronary disease status post bypass, who presents to the hospital due to right-sided facial droop and left-sided numbness that began earlier 9/9.   Assessment / Plan / Recommendation Clinical Impression  Pt presents with functional swallowing at bedside with no overt s/s of aspiration with any tested consistency. Oral mech exam revealed decreased strength on the right with obvios droop. Pt reports normal sensation. Pt tolerated thin liquids from the spoon, cup and straw but seemed to do better from the straw secondary to right sided droop. Pt was able to masticate solids despite not wearing dentures. No pocketing and only mild oral residue after the swallow.Vocal quality remained clear throughout assesment, laryngeal elevation appeared adequate. Noted mild dysarthria but speech was mostly intelligible.No apparent language or cognitive  deficits.     SLP Assessment   No apparent dysphagia. Noted mild Dysarthria and definite, right facial droop and difficulty closing his right eye. Neurology confirmed Bells Palsy.   Follow Up Recommendations   No ST needs at discharge    Frequency and Duration min 3x week     Pertinent Vitals/Pain Pain Assessment: No/denies pain   SLP Goals  Patient/Family Stated Goal: Pt reported he wants to go home today  SLP Evaluation Prior Functioning   Able to tolerate all consistencies per Pt. Speech normal per family and now with mild dysarthria.   Cognition    WFL   Comprehension    WFL   Expression   Mild dysathria but no mostly intelligible speech  Oral / Motor Oral Motor/Sensory Function Labial ROM: Reduced right Labial Symmetry: Abnormal symmetry right Labial Strength: Reduced Labial Sensation: Reduced Lingual ROM: Reduced right Lingual Symmetry: Abnormal symmetry right Lingual Strength: Reduced Lingual Sensation: Reduced Facial ROM: Reduced right Facial Symmetry: Right droop Facial Strength: Reduced Facial Sensation: Reduced Mandible: Within Functional Limits       [name] 02/13/2015, 12:20 PM

## 2015-02-13 NOTE — Consult Note (Signed)
CC: R facial droop and R sided numbness   HPI: Adam Merritt is an 69 y.o. male . male with a known history of morbid obesity, COPD, diabetes, hypertension, hyperlipidemia, history of coronary disease status post bypass, who presents to the hospital due to right-sided facial droop and left-sided numbness that began earlier today. Patient presented to the ER and was noted to be significantly hypertensive with systolic blood pressures in the 190s to 200s.  CTH no acute intracranial abnormality but pt is claustrophobic.    Past Medical History  Diagnosis Date  . Obstructive chronic bronchitis without exacerbation   . Insomnia   . Chest pain   . Lumbago   . Herpes zoster without mention of complication   . Neuropathy in diabetes   . Hypertension   . Hyperlipidemia   . Diabetes mellitus without complication   . Coronary artery disease   . Varicose veins   . Esophageal reflux   . Other malaise and fatigue   . Anemia   . CAD (coronary artery disease)   . Stroke     Past Surgical History  Procedure Laterality Date  . Coronary artery bypass graft    . Cholecystectomy      Family History  Problem Relation Age of Onset  . Diabetes Mother   . Hyperlipidemia Mother   . Hypertension Mother   . Diabetes Father   . Hyperlipidemia Father   . Hypertension Father     Social History:  reports that he has never smoked. He has never used smokeless tobacco. He reports that he does not drink alcohol or use illicit drugs.  No Known Allergies  Medications: I have reviewed the patient's current medications.  ROS: History obtained from the patient  General ROS: negative for - chills, fatigue, fever, night sweats, weight gain or weight loss Psychological ROS: negative for - behavioral disorder, hallucinations, memory difficulties, mood swings or suicidal ideation Ophthalmic ROS: negative for - blurry vision, double vision, eye pain or loss of vision ENT ROS: negative for - epistaxis, nasal  discharge, oral lesions, sore throat, tinnitus or vertigo Allergy and Immunology ROS: negative for - hives or itchy/watery eyes Hematological and Lymphatic ROS: negative for - bleeding problems, bruising or swollen lymph nodes Endocrine ROS: negative for - galactorrhea, hair pattern changes, polydipsia/polyuria or temperature intolerance Respiratory ROS: negative for - cough, hemoptysis, shortness of breath or wheezing Cardiovascular ROS: negative for - chest pain, dyspnea on exertion, edema or irregular heartbeat Gastrointestinal ROS: negative for - abdominal pain, diarrhea, hematemesis, nausea/vomiting or stool incontinence Genito-Urinary ROS: negative for - dysuria, hematuria, incontinence or urinary frequency/urgency Musculoskeletal ROS: negative for - joint swelling or muscular weakness Neurological ROS: as noted in HPI Dermatological ROS: negative for rash and skin lesion changes  Physical Examination: Blood pressure 168/88, pulse 85, temperature 98 F (36.7 C), temperature source Oral, resp. rate 18, height 5\' 5"  (1.651 m), weight 108.863 kg (240 lb), SpO2 96 %.  Neurological Examination Mental Status: Alert, oriented, thought content appropriate.  Speech fluent without evidence of aphasia.  Able to follow 3 step commands without difficulty. Cranial Nerves: II: Discs flat bilaterally; Visual fields grossly normal, pupils equal, round, reactive to light and accommodation III,IV, VI: R eye difficulty closing V,VII: R facial droop VIII: hearing normal bilaterally IX,X: gag reflex present XI: bilateral shoulder shrug XII: midline tongue extension Motor: Right : Upper extremity   4+/5    Left:     Upper extremity   4+/5  Lower  extremity   4+/5     Lower extremity   4+/5 Tone and bulk:normal tone throughout; no atrophy noted Sensory: Pinprick and light touch intact throughout, bilaterally Deep Tendon Reflexes: 1+ and symmetric throughout Plantars: Right: downgoing   Left:  downgoing Cerebellar: normal finger-to-nose, normal rapid alternating movements and normal heel-to-shin test Gait: not tested      Laboratory Studies:   Basic Metabolic Panel:  Recent Labs Lab 02/12/15 1838 02/13/15 0448  NA 137 138  K 4.3 3.8  CL 104 105  CO2 26 26  GLUCOSE 142* 136*  BUN 13 15  CREATININE 1.49* 1.47*  CALCIUM 8.9 8.5*    Liver Function Tests:  Recent Labs Lab 02/12/15 1838  AST 29  ALT 22  ALKPHOS 85  BILITOT 0.5  PROT 7.1  ALBUMIN 3.6   No results for input(s): LIPASE, AMYLASE in the last 168 hours. No results for input(s): AMMONIA in the last 168 hours.  CBC:  Recent Labs Lab 02/12/15 1838 02/13/15 0448  WBC 10.3 9.9  NEUTROABS 7.7*  --   HGB 13.8 12.9*  HCT 40.2 37.7*  MCV 93.1 93.4  PLT 148* 133*    Cardiac Enzymes: No results for input(s): CKTOTAL, CKMB, CKMBINDEX, TROPONINI in the last 168 hours.  BNP: Invalid input(s): POCBNP  CBG:  Recent Labs Lab 02/12/15 2314 02/13/15 0733  GLUCAP 153* 150*    Microbiology: Results for orders placed or performed in visit on 01/01/13  Culture, blood (single)     Status: None   Collection Time: 01/01/13  5:07 PM  Result Value Ref Range Status   Micro Text Report   Final       ORGANISM 1                STAPHYLOCOCCUS EPIDERMIDIS   COMMENT                   IN AEROBIC AND ANAEROBIC BOTTLES   COMMENT                   -   GRAM STAIN                GRAM POSITIVE COCCI IN CLUSTERS   GRAM STAIN                -   ANTIBIOTIC                    ORG#1    ORG#2     CIPROFLOXACIN                 R                  CLINDAMYCIN                   R                  ERYTHROMYCIN                  R                  GENTAMICIN                    S                  LEVOFLOXACIN                  I  LINEZOLID                     S                  OXACILLIN                     S                    Culture, blood (single)     Status: None   Collection Time: 01/01/13  5:07  PM  Result Value Ref Range Status   Micro Text Report   Final       ORGANISM 1                STAPHYLOCOCCUS EPIDERMIDIS   COMMENT                   IN AEROBIC BOTTLE ONLY   GRAM STAIN                GRAM POSITIVE COCCI IN CLUSTERS   ANTIBIOTIC                    ORG#1     CIPROFLOXACIN                 R         CLINDAMYCIN                   R         ERYTHROMYCIN                  R         GENTAMICIN                    S         LEVOFLOXACIN                  I         LINEZOLID                     S         OXACILLIN                     S           Culture, blood (single)     Status: None   Collection Time: 01/03/13 10:28 AM  Result Value Ref Range Status   Micro Text Report   Final       COMMENT                   NO GROWTH AEROBICALLY/ANAEROBICALLY IN 5 DAYS   ANTIBIOTIC                                                      Culture, blood (single)     Status: None   Collection Time: 01/03/13 10:28 AM  Result Value Ref Range Status   Micro Text Report   Final       COMMENT                   NO GROWTH AEROBICALLY/ANAEROBICALLY IN 5 DAYS   ANTIBIOTIC  Coagulation Studies:  Recent Labs  02/12/15 1838  LABPROT 13.6  INR 1.02    Urinalysis: No results for input(s): COLORURINE, LABSPEC, PHURINE, GLUCOSEU, HGBUR, BILIRUBINUR, KETONESUR, PROTEINUR, UROBILINOGEN, NITRITE, LEUKOCYTESUR in the last 168 hours.  Invalid input(s): APPERANCEUR  Lipid Panel:     Component Value Date/Time   CHOL 104 01/02/2013 0149   TRIG 69 01/02/2013 0149   HDL 26* 01/02/2013 0149   VLDL 14 01/02/2013 0149   LDLCALC 64 01/02/2013 0149    HgbA1C:  Lab Results  Component Value Date   HGBA1C 6.7* 01/09/2012    Urine Drug Screen:  No results found for: LABOPIA, COCAINSCRNUR, LABBENZ, AMPHETMU, THCU, LABBARB  Alcohol Level: No results for input(s): ETH in the last 168 hours.  Other results: EKG: normal EKG, normal sinus rhythm,  unchanged from previous tracings.  Imaging: Dg Chest 1 View  02/12/2015   CLINICAL DATA:  RIGHT-sided facial numbness which began earlier today. Hypertension.  EXAM: CHEST  1 VIEW  COMPARISON:  01/05/2014.  FINDINGS: Cardiomegaly. Low lung volumes. Previous median sternotomy for CABG. No active infiltrates or failure. No effusion or pneumothorax.  IMPRESSION: Stable chest.   Electronically Signed   By: Staci Righter M.D.   On: 02/12/2015 19:37   Ct Head Wo Contrast  02/12/2015   CLINICAL DATA:  Right-sided facial droop and right-sided weakness. Code stroke.  EXAM: CT HEAD WITHOUT CONTRAST  TECHNIQUE: Contiguous axial images were obtained from the base of the skull through the vertex without intravenous contrast.  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 No acute hemorrhage, infarct, or mass lesion is identified. No midline shift. Ventricles are normal in size. Orbits and paranasal sinuses are unremarkable. No skull fracture. No midline shift. No acute osseous abnormality. Orbits and paranasal sinuses are unremarkable.  IMPRESSION: No acute intracranial finding. Critical Value/emergent results were called by telephone at the time of interpretation on 02/12/2015 at 6:44 pm to Dr. Larae Grooms , who verbally acknowledged these results.   Electronically Signed   By: Conchita Paris M.D.   On: 02/12/2015 18:43     Assessment/Plan:  69 y.o. male . male with a known history of morbid obesity, COPD, diabetes, hypertension, hyperlipidemia, history of coronary disease status post bypass, who presents to the hospital due to right-sided facial droop and left-sided numbness that began earlier today. Patient presented to the ER and was noted to be significantly hypertensive with systolic blood pressures in the 190s to 200s.  CTH no acute intracranial abnormality but pt is claustrophobic.    Repeat CTH today prelim no acute abnormality seen From  examination pt has difficulty closing R eye and R facial droop. This appears to e Bell's Palsy Do no think needs an more stroke work up Pt is on ASA please con't as out pt.  D/c planning today on 5 day course medrol pack. Eyes drop to prevent drying.  Do not think he needs an eye patch as he can close the R eye but it is week. Leotis Pain  02/13/2015, 10:58 AM

## 2015-02-13 NOTE — Progress Notes (Signed)
Chart reviewed. Pt has been out for CT for past hr. Reportedly tolerating diet. Will reattempt eval when Pt is back in his room.

## 2015-02-13 NOTE — Progress Notes (Signed)
[facility] [place] NAME: Keno Tuckerman    MR#:  [id number]  DATE OF BIRTH:  1946/02/19  SUBJECTIVE:  CHIEF COMPLAINT:   Chief Complaint  Patient presents with  . Code Stroke   right-sided facial droop and left facial numbness.  Patient denies any headache, nausea, vomiting. Patient could not tolerate MRI despite getting some anxiolytics.  REVIEW OF SYSTEMS:    Review of Systems  Constitutional: Negative for fever and chills.  HENT: Negative for congestion and tinnitus.   Eyes: Negative for blurred vision and double vision.  Respiratory: Negative for cough, shortness of breath and wheezing.   Cardiovascular: Negative for chest pain, orthopnea and PND.  Gastrointestinal: Negative for nausea, vomiting, abdominal pain and diarrhea.  Genitourinary: Negative for dysuria and hematuria.  Neurological: Positive for sensory change (right-sided facial droop). Negative for dizziness and focal weakness.  All other systems reviewed and are negative.   Nutrition: Heart healthy Tolerating Diet: Yes Tolerating PT: Await eval   DRUG ALLERGIES:  No Known Allergies  VITALS:  Blood pressure 168/88, pulse 85, temperature 98 F (36.7 C), temperature source Oral, resp. rate 18, height 5\' 5"  (1.651 m), weight 108.863 kg (240 lb), SpO2 96 %.  PHYSICAL EXAMINATION:   Physical Exam  GENERAL:  69 y.o.-year-old patient lying in the bed with no acute distress.  EYES: Pupils equal, round, reactive to light and accommodation. No scleral icterus. Extraocular muscles intact.  HEENT: Head atraumatic, normocephalic. Oropharynx and nasopharynx clear.  NECK:  Supple, no jugular venous distention. No thyroid enlargement, no tenderness.  LUNGS: Normal breath sounds bilaterally, no wheezing, rales, rhonchi. No use of accessory muscles of respiration.  CARDIOVASCULAR: S1, S2 normal. No murmurs, rubs, or gallops.  ABDOMEN: Soft, nontender, nondistended. Bowel  sounds present. No organomegaly or mass.  EXTREMITIES: No cyanosis, clubbing or edema b/l.    NEUROLOGIC: Right-sided facial droop. Cranial nerves II through XII are intact. 5 out of 5 strength in the upper extremities bilaterally. Lower extremities are globally weak which is unchanged and chronic PSYCHIATRIC: The patient is alert and oriented x 3. Good affect SKIN: No obvious rash, lesion, or ulcer.    LABORATORY PANEL:   CBC  Recent Labs Lab 02/13/15 0448  WBC 9.9  HGB 12.9*  HCT 37.7*  PLT 133*   ------------------------------------------------------------------------------------------------------------------  Chemistries   Recent Labs Lab 02/12/15 1838 02/13/15 0448  NA 137 138  K 4.3 3.8  CL 104 105  CO2 26 26  GLUCOSE 142* 136*  BUN 13 15  CREATININE 1.49* 1.47*  CALCIUM 8.9 8.5*  AST 29  --   ALT 22  --   ALKPHOS 85  --   BILITOT 0.5  --    ------------------------------------------------------------------------------------------------------------------  Cardiac Enzymes No results for input(s): TROPONINI in the last 168 hours. ------------------------------------------------------------------------------------------------------------------  RADIOLOGY:  Dg Chest 1 View  02/12/2015   CLINICAL DATA:  RIGHT-sided facial numbness which began earlier today. Hypertension.  EXAM: CHEST  1 VIEW  COMPARISON:  01/05/2014.  FINDINGS: Cardiomegaly. Low lung volumes. Previous median sternotomy for CABG. No active infiltrates or failure. No effusion or pneumothorax.  IMPRESSION: Stable chest.   Electronically Signed   By: Staci Righter M.D.   On: 02/12/2015 19:37   Ct Head Wo Contrast  02/12/2015   CLINICAL DATA:  Right-sided facial droop and right-sided weakness. Code stroke.  EXAM: CT HEAD WITHOUT CONTRAST  TECHNIQUE: Contiguous axial images were obtained from the base of the skull through  the vertex without intravenous contrast.  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 No acute hemorrhage, infarct, or mass lesion is identified. No midline shift. Ventricles are normal in size. Orbits and paranasal sinuses are unremarkable. No skull fracture. No midline shift. No acute osseous abnormality. Orbits and paranasal sinuses are unremarkable.  IMPRESSION: No acute intracranial finding. Critical Value/emergent results were called by telephone at the time of interpretation on 02/12/2015 at 6:44 pm to Dr. Larae Grooms , who verbally acknowledged these results.   Electronically Signed   By: Conchita Paris M.D.   On: 02/12/2015 18:43     ASSESSMENT AND PLAN:   69 year old male with past medical history of COPD, obstructive sleep apnea, morbid obesity, hypertension, hyperlipidemia, GERD, history of aplastic anemia came into the hospital due to right-sided facial droop and also left-sided facial numbness.  #1 CVA-this was a suspected diagnosis given patient's right-sided facial droop. No other focal neurologic deficits and therefore this could also be Bell's palsy -CT head on admission is negative for any acute pathology. Patient could not tolerate MRI due to claustrophobia despite getting anxiolytics and therefore we'll get repeat CT head today. -Await carotid duplex, and two-dimensional echocardiogram results -Continue aspirin, statin.  Discussed plan with neurology and will await input -Await speech, PT eval  #2 accelerated hypertension-much improved since yesterday  -continue Coreg, hydralazine.  Continue when necessary hydralazine IV -We will tolerate some element of hypertension given his possible acute CVA.  #3 hyperlipidemia-continue atorvastatin.  #4 GERD-continue Protonix  #5 diabetes-continue sliding scale insulin.  #6 diabetic neuropathy-continue Neurontin.  All the records are reviewed and case discussed with Care Management/Social Workerr. Management plans discussed with the  patient, family and they are in agreement.  CODE STATUS: Full  DVT Prophylaxis: Heparin subcutaneous  TOTAL TIME TAKING CARE OF THIS PATIENT: 30 minutes.   POSSIBLE D/C IN 1-2 DAYS, DEPENDING ON CLINICAL CONDITION.   Henreitta Leber M.D on 02/13/2015 at 9:48 AM  Between 7am to 6pm - Pager - [phone]  After 6pm go to [email or web address] - password [email or web address] [facility]  Office  [phone]  CC: Primary care physician; [name], MD

## 2015-02-13 NOTE — Plan of Care (Signed)
Problem: Discharge/Transitional Outcomes Goal: Other Discharge Outcomes/Goals Outcome: Progressing Plan of care progress to goals: Barriers to progression- none identified at present. Educational plan complete- admission information given and stroke education given, continue to educate patient as needed.  Hemodynamically stable- VSS, Head CT results negative per report, continue to monitor labs. Independent mobility- pt instructed to call for assistance with ambulation while here in the hospital, moderate fall risk with a history of 1 fall in the past 6 months.  Tolerating diet- no c/o of nausea or vomiting since admission. Passed nursing bedside swallow evaluation in the ED per report from ED RN. Pt has had no difficulty swallowing since arrival to room. INR monitor plan- baseline PT/INR established prior to admission to room 119.  Family and patient agree on discharge plan- plan is that patient will be discharged to home when stable.  Family/caregiver willing and able to support plan- patient verbalized that his wife and his daughter are willing and able to assist with his care after discharge. PCP appointment- family can provided transportation, will need to obtain appointment time. Ability to attain medications- pt verbalized no difficulty attaining medications.  A&O patient admitted with stroke to room 119. Pt scored a "3" on the NIHSS. Off unit telemetry is NSR, hr 70's with continuous sp02. Elevated BP, MD aware, otherwise VSS. Moderate fall risk.

## 2015-02-13 NOTE — Progress Notes (Signed)
PT AND WIFE GIVEN INSTRUCTIONS R/T ACTIVITY, DIET, FOLLOWUP CARE, WHEN TO CALL MD AND MEDICATIONS, VOICED UNDERSTANDING, PRESCRIPTION X 1 GIVEN TO PT WIFE, BELLS PALSY INFORMATION SHEET GIVEN TO THE PT, PT D/C HOME VIA WHEELCHAIR ESCORTED BY STAFF AND FAMILY

## 2015-02-13 NOTE — Discharge Instructions (Signed)
°  DIET:  °Cardiac diet and Diabetic diet ° °DISCHARGE CONDITION:  °Stable ° °ACTIVITY:  °Activity as tolerated ° °OXYGEN:  °Home Oxygen: No. °  °Oxygen Delivery: room air ° °DISCHARGE LOCATION:  °home  ° °If you experience worsening of your admission symptoms, develop shortness of breath, life threatening emergency, suicidal or homicidal thoughts you must seek medical attention immediately by calling 911 or calling your MD immediately  if symptoms less severe. ° °You Must read complete instructions/literature along with all the possible adverse reactions/side effects for all the Medicines you take and that have been prescribed to you. Take any new Medicines after you have completely understood and accpet all the possible adverse reactions/side effects.  ° °Please note ° °You were cared for by a hospitalist during your hospital stay. If you have any questions about your discharge medications or the care you received while you were in the hospital after you are discharged, you can call the unit and asked to speak with the hospitalist on call if the hospitalist that took care of you is not available. Once you are discharged, your primary care physician will handle any further medical issues. Please note that NO REFILLS for any discharge medications will be authorized once you are discharged, as it is imperative that you return to your primary care physician (or establish a relationship with a primary care physician if you do not have one) for your aftercare needs so that they can reassess your need for medications and monitor your lab values. ° ° ° °

## 2015-02-13 NOTE — Evaluation (Signed)
Physical Therapy Evaluation Patient Details Name: Adam Merritt MRN: [id number] DOB: 1945/09/21 Today's Date: 02/13/2015   History of Present Illness  Pt here with R facial droop, some UE numbness  Clinical Impression  Pt shows good confidence and safety with all requested acts.  Adjusted SPC to more appropriate height and he reports that he feels better with it.  He has no LOBs and generally shows good, near baseline mobiltiy and ambulation.  He is able to negotiate up/down 6 steps with single rail and ultimately shows no significant need for further PT intervention.    Follow Up Recommendations No PT follow up    Equipment Recommendations       Recommendations for Other Services       Precautions / Restrictions Precautions Precautions: Fall Restrictions Weight Bearing Restrictions: No      Mobility  Bed Mobility Overal bed mobility: Independent                Transfers Overall transfer level: Independent               General transfer comment: Pt is able to easily rise and maintain standing balance   Ambulation/Gait Ambulation/Gait assistance: Modified independent (Device/Increase time) Ambulation Distance (Feet): 260 Feet Assistive device: Straight cane       General Gait Details: Pt ambulates with good confidence and consistent cadence and speed.  He reports that he somewhat slower than his baseline, but feels good about how he did agrees that he does not need further PT at home.  Stairs            Wheelchair Mobility    Modified Rankin (Stroke Patients Only)       Balance                                             Pertinent Vitals/Pain Pain Assessment: No/denies pain    Home Living Family/patient expects to be discharged to:: Private residence Living Arrangements: Spouse/significant other   Type of Home: House Home Access: Stairs to enter;Ramped entrance Entrance Stairs-Rails: Can reach both Entrance  Stairs-Number of Steps: 5          Prior Function Level of Independence: Independent with assistive device(s) (recently started using a cane)         Comments: "I can do everything I need"     Hand Dominance        Extremity/Trunk Assessment   Upper Extremity Assessment: Overall WFL for tasks assessed           Lower Extremity Assessment: Overall WFL for tasks assessed         Communication   Communication: No difficulties  Cognition Arousal/Alertness: Awake/alert Behavior During Therapy: WFL for tasks assessed/performed Overall Cognitive Status: Within Functional Limits for tasks assessed                      General Comments      Exercises        Assessment/Plan    PT Assessment Patent does not need any further PT services  PT Diagnosis Difficulty walking   PT Problem List    PT Treatment Interventions     PT Goals (Current goals can be found in the Care Plan section) Acute Rehab PT Goals Patient Stated Goal: "I want to be home by 7:30 to watch the [facility] game"  Frequency     Barriers to discharge        Co-evaluation               End of Session Equipment Utilized During Treatment: Gait belt Activity Tolerance: Patient tolerated treatment well Patient left: with bed alarm set      Functional Assessment Tool Used:  (clinical judgement) Functional Limitation: Mobility: Walking and moving around Mobility: Walking and Moving Around Current Status [phone]): 0 percent impaired, limited or restricted Mobility: Walking and Moving Around Goal Status [phone]): 0 percent impaired, limited or restricted Mobility: Walking and Moving Around Discharge Status [phone]): 0 percent impaired, limited or restricted    Time: [id number] PT Time Calculation (min) (ACUTE ONLY): 18 min   Charges:   PT Evaluation $Initial PT Evaluation Tier I: 1 Procedure     PT G Codes:   PT G-Codes **NOT FOR INPATIENT CLASS** Functional Assessment Tool Used:   (clinical judgement) Functional Limitation: Mobility: Walking and moving around Mobility: Walking and Moving Around Current Status [id number]): 0 percent impaired, limited or restricted Mobility: Walking and Moving Around Goal Status [id number]): 0 percent impaired, limited or restricted Mobility: Walking and Moving Around Discharge Status [id number]): 0 percent impaired, limited or restricted   [name], PT, DPT [phone]  Kreg Shropshire 02/13/2015, 1:13 PM

## 2015-02-15 ENCOUNTER — Encounter: Payer: Medicare Other | Admitting: Occupational Therapy

## 2015-02-16 DIAGNOSIS — H4901 Third [oculomotor] nerve palsy, right eye: Secondary | ICD-10-CM | POA: Diagnosis not present

## 2015-02-16 DIAGNOSIS — E1140 Type 2 diabetes mellitus with diabetic neuropathy, unspecified: Secondary | ICD-10-CM | POA: Diagnosis not present

## 2015-02-16 DIAGNOSIS — G4700 Insomnia, unspecified: Secondary | ICD-10-CM | POA: Diagnosis not present

## 2015-02-16 DIAGNOSIS — I8310 Varicose veins of unspecified lower extremity with inflammation: Secondary | ICD-10-CM | POA: Diagnosis not present

## 2015-02-16 DIAGNOSIS — L03119 Cellulitis of unspecified part of limb: Secondary | ICD-10-CM | POA: Diagnosis not present

## 2015-02-17 ENCOUNTER — Encounter: Payer: Medicare Other | Admitting: Occupational Therapy

## 2015-02-19 ENCOUNTER — Encounter: Payer: Medicare Other | Admitting: Occupational Therapy

## 2015-02-22 ENCOUNTER — Encounter: Payer: Medicare Other | Admitting: Occupational Therapy

## 2015-02-24 ENCOUNTER — Encounter: Payer: Medicare Other | Admitting: Occupational Therapy

## 2015-02-26 ENCOUNTER — Encounter: Payer: Medicare Other | Admitting: Occupational Therapy

## 2015-03-01 ENCOUNTER — Encounter: Payer: Medicare Other | Admitting: Occupational Therapy

## 2015-03-02 DIAGNOSIS — G2581 Restless legs syndrome: Secondary | ICD-10-CM | POA: Diagnosis not present

## 2015-03-02 DIAGNOSIS — G4730 Sleep apnea, unspecified: Secondary | ICD-10-CM | POA: Diagnosis not present

## 2015-03-02 DIAGNOSIS — R601 Generalized edema: Secondary | ICD-10-CM | POA: Diagnosis not present

## 2015-03-03 ENCOUNTER — Encounter: Payer: Medicare Other | Admitting: Occupational Therapy

## 2015-03-05 ENCOUNTER — Encounter: Payer: Medicare Other | Admitting: Occupational Therapy

## 2015-03-18 DIAGNOSIS — I10 Essential (primary) hypertension: Secondary | ICD-10-CM | POA: Diagnosis not present

## 2015-03-18 DIAGNOSIS — M25579 Pain in unspecified ankle and joints of unspecified foot: Secondary | ICD-10-CM | POA: Diagnosis not present

## 2015-03-18 DIAGNOSIS — G4700 Insomnia, unspecified: Secondary | ICD-10-CM | POA: Diagnosis not present

## 2015-03-18 DIAGNOSIS — I739 Peripheral vascular disease, unspecified: Secondary | ICD-10-CM | POA: Diagnosis not present

## 2015-03-18 DIAGNOSIS — E11610 Type 2 diabetes mellitus with diabetic neuropathic arthropathy: Secondary | ICD-10-CM | POA: Diagnosis not present

## 2015-03-18 DIAGNOSIS — G510 Bell's palsy: Secondary | ICD-10-CM | POA: Diagnosis not present

## 2015-03-18 DIAGNOSIS — Z0001 Encounter for general adult medical examination with abnormal findings: Secondary | ICD-10-CM | POA: Diagnosis not present

## 2015-03-24 DIAGNOSIS — H16211 Exposure keratoconjunctivitis, right eye: Secondary | ICD-10-CM | POA: Diagnosis not present

## 2015-04-05 DIAGNOSIS — G510 Bell's palsy: Secondary | ICD-10-CM | POA: Diagnosis not present

## 2015-04-20 DIAGNOSIS — I10 Essential (primary) hypertension: Secondary | ICD-10-CM | POA: Diagnosis not present

## 2015-04-20 DIAGNOSIS — M25579 Pain in unspecified ankle and joints of unspecified foot: Secondary | ICD-10-CM | POA: Diagnosis not present

## 2015-04-20 DIAGNOSIS — G510 Bell's palsy: Secondary | ICD-10-CM | POA: Diagnosis not present

## 2015-04-20 DIAGNOSIS — J449 Chronic obstructive pulmonary disease, unspecified: Secondary | ICD-10-CM | POA: Diagnosis not present

## 2015-04-20 DIAGNOSIS — E1140 Type 2 diabetes mellitus with diabetic neuropathy, unspecified: Secondary | ICD-10-CM | POA: Diagnosis not present

## 2015-05-04 ENCOUNTER — Ambulatory Visit: Payer: Medicare Other | Attending: Nurse Practitioner | Admitting: Speech Pathology

## 2015-05-04 DIAGNOSIS — G510 Bell's palsy: Secondary | ICD-10-CM | POA: Insufficient documentation

## 2015-05-04 NOTE — Therapy (Signed)
[place] MAIN [facility] SERVICES [address] 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  The patient's speech is intelligible and he has a functional swallow. Unfortunately, there is no speech therapy approach that will reverse the Bell's Palsy.  The patient elected to not continue with the evaluation after this was explained.  The patient declined the physical therapy as well.  Leroy Sea, MS/CCC- SLP    Lou Miner 05/04/2015, 3:25 PM  [place] MAIN [facility] SERVICES [address] [place], [place], [address] Phone: [phone]   Fax:  [phone]

## 2015-05-11 ENCOUNTER — Encounter: Payer: Medicare Other | Admitting: Speech Pathology

## 2015-05-13 ENCOUNTER — Ambulatory Visit: Payer: Medicare Other | Admitting: Physical Therapy

## 2015-05-13 ENCOUNTER — Encounter: Payer: Medicare Other | Admitting: Speech Pathology

## 2015-05-24 DIAGNOSIS — I10 Essential (primary) hypertension: Secondary | ICD-10-CM | POA: Diagnosis not present

## 2015-05-24 DIAGNOSIS — E1140 Type 2 diabetes mellitus with diabetic neuropathy, unspecified: Secondary | ICD-10-CM | POA: Diagnosis not present

## 2015-05-24 DIAGNOSIS — J449 Chronic obstructive pulmonary disease, unspecified: Secondary | ICD-10-CM | POA: Diagnosis not present

## 2015-05-24 DIAGNOSIS — G4700 Insomnia, unspecified: Secondary | ICD-10-CM | POA: Diagnosis not present

## 2015-05-24 DIAGNOSIS — M150 Primary generalized (osteo)arthritis: Secondary | ICD-10-CM | POA: Diagnosis not present

## 2015-05-24 DIAGNOSIS — G510 Bell's palsy: Secondary | ICD-10-CM | POA: Diagnosis not present

## 2015-05-24 DIAGNOSIS — I739 Peripheral vascular disease, unspecified: Secondary | ICD-10-CM | POA: Diagnosis not present

## 2015-06-22 DIAGNOSIS — G510 Bell's palsy: Secondary | ICD-10-CM | POA: Diagnosis not present

## 2015-06-22 DIAGNOSIS — M25579 Pain in unspecified ankle and joints of unspecified foot: Secondary | ICD-10-CM | POA: Diagnosis not present

## 2015-06-22 DIAGNOSIS — E1165 Type 2 diabetes mellitus with hyperglycemia: Secondary | ICD-10-CM | POA: Diagnosis not present

## 2015-06-22 DIAGNOSIS — M792 Neuralgia and neuritis, unspecified: Secondary | ICD-10-CM | POA: Diagnosis not present

## 2015-06-22 DIAGNOSIS — I10 Essential (primary) hypertension: Secondary | ICD-10-CM | POA: Diagnosis not present

## 2015-06-22 DIAGNOSIS — G4700 Insomnia, unspecified: Secondary | ICD-10-CM | POA: Diagnosis not present

## 2015-06-22 DIAGNOSIS — J449 Chronic obstructive pulmonary disease, unspecified: Secondary | ICD-10-CM | POA: Diagnosis not present

## 2015-06-24 DIAGNOSIS — G4733 Obstructive sleep apnea (adult) (pediatric): Secondary | ICD-10-CM | POA: Diagnosis not present

## 2015-06-24 DIAGNOSIS — R0602 Shortness of breath: Secondary | ICD-10-CM | POA: Diagnosis not present

## 2015-06-24 DIAGNOSIS — I25810 Atherosclerosis of coronary artery bypass graft(s) without angina pectoris: Secondary | ICD-10-CM | POA: Diagnosis not present

## 2015-06-24 DIAGNOSIS — I10 Essential (primary) hypertension: Secondary | ICD-10-CM | POA: Diagnosis not present

## 2015-07-29 DIAGNOSIS — M25579 Pain in unspecified ankle and joints of unspecified foot: Secondary | ICD-10-CM | POA: Diagnosis not present

## 2015-07-29 DIAGNOSIS — E11610 Type 2 diabetes mellitus with diabetic neuropathic arthropathy: Secondary | ICD-10-CM | POA: Diagnosis not present

## 2015-07-29 DIAGNOSIS — R0602 Shortness of breath: Secondary | ICD-10-CM | POA: Diagnosis not present

## 2015-07-29 DIAGNOSIS — M545 Low back pain: Secondary | ICD-10-CM | POA: Diagnosis not present

## 2015-07-29 DIAGNOSIS — I739 Peripheral vascular disease, unspecified: Secondary | ICD-10-CM | POA: Diagnosis not present

## 2015-07-29 DIAGNOSIS — J209 Acute bronchitis, unspecified: Secondary | ICD-10-CM | POA: Diagnosis not present

## 2015-08-03 DIAGNOSIS — J449 Chronic obstructive pulmonary disease, unspecified: Secondary | ICD-10-CM | POA: Diagnosis not present

## 2015-08-03 DIAGNOSIS — G4730 Sleep apnea, unspecified: Secondary | ICD-10-CM | POA: Diagnosis not present

## 2015-08-03 DIAGNOSIS — I5042 Chronic combined systolic (congestive) and diastolic (congestive) heart failure: Secondary | ICD-10-CM | POA: Diagnosis not present

## 2015-09-16 DIAGNOSIS — M792 Neuralgia and neuritis, unspecified: Secondary | ICD-10-CM | POA: Diagnosis not present

## 2015-09-16 DIAGNOSIS — M25579 Pain in unspecified ankle and joints of unspecified foot: Secondary | ICD-10-CM | POA: Diagnosis not present

## 2015-09-16 DIAGNOSIS — L03119 Cellulitis of unspecified part of limb: Secondary | ICD-10-CM | POA: Diagnosis not present

## 2015-09-16 DIAGNOSIS — I739 Peripheral vascular disease, unspecified: Secondary | ICD-10-CM | POA: Diagnosis not present

## 2015-09-16 DIAGNOSIS — E1165 Type 2 diabetes mellitus with hyperglycemia: Secondary | ICD-10-CM | POA: Diagnosis not present

## 2015-09-16 DIAGNOSIS — J449 Chronic obstructive pulmonary disease, unspecified: Secondary | ICD-10-CM | POA: Diagnosis not present

## 2015-10-21 DIAGNOSIS — J069 Acute upper respiratory infection, unspecified: Secondary | ICD-10-CM | POA: Diagnosis not present

## 2015-10-21 DIAGNOSIS — J449 Chronic obstructive pulmonary disease, unspecified: Secondary | ICD-10-CM | POA: Diagnosis not present

## 2015-10-21 DIAGNOSIS — I10 Essential (primary) hypertension: Secondary | ICD-10-CM | POA: Diagnosis not present

## 2015-10-21 DIAGNOSIS — E1165 Type 2 diabetes mellitus with hyperglycemia: Secondary | ICD-10-CM | POA: Diagnosis not present

## 2015-10-21 DIAGNOSIS — G4700 Insomnia, unspecified: Secondary | ICD-10-CM | POA: Diagnosis not present

## 2015-11-11 DIAGNOSIS — L03119 Cellulitis of unspecified part of limb: Secondary | ICD-10-CM | POA: Diagnosis not present

## 2015-11-11 DIAGNOSIS — M150 Primary generalized (osteo)arthritis: Secondary | ICD-10-CM | POA: Diagnosis not present

## 2015-11-11 DIAGNOSIS — J449 Chronic obstructive pulmonary disease, unspecified: Secondary | ICD-10-CM | POA: Diagnosis not present

## 2015-11-11 DIAGNOSIS — J069 Acute upper respiratory infection, unspecified: Secondary | ICD-10-CM | POA: Diagnosis not present

## 2015-11-11 DIAGNOSIS — E11610 Type 2 diabetes mellitus with diabetic neuropathic arthropathy: Secondary | ICD-10-CM | POA: Diagnosis not present

## 2015-11-11 DIAGNOSIS — I739 Peripheral vascular disease, unspecified: Secondary | ICD-10-CM | POA: Diagnosis not present

## 2015-11-18 ENCOUNTER — Encounter: Payer: Medicare Other | Attending: Surgery | Admitting: Surgery

## 2015-11-18 DIAGNOSIS — I739 Peripheral vascular disease, unspecified: Secondary | ICD-10-CM | POA: Insufficient documentation

## 2015-11-18 DIAGNOSIS — Z79899 Other long term (current) drug therapy: Secondary | ICD-10-CM | POA: Diagnosis not present

## 2015-11-18 DIAGNOSIS — J449 Chronic obstructive pulmonary disease, unspecified: Secondary | ICD-10-CM | POA: Insufficient documentation

## 2015-11-18 DIAGNOSIS — Z6841 Body Mass Index (BMI) 40.0 and over, adult: Secondary | ICD-10-CM | POA: Insufficient documentation

## 2015-11-18 DIAGNOSIS — Z7982 Long term (current) use of aspirin: Secondary | ICD-10-CM | POA: Diagnosis not present

## 2015-11-18 DIAGNOSIS — D619 Aplastic anemia, unspecified: Secondary | ICD-10-CM | POA: Diagnosis not present

## 2015-11-18 DIAGNOSIS — G4730 Sleep apnea, unspecified: Secondary | ICD-10-CM | POA: Diagnosis not present

## 2015-11-18 DIAGNOSIS — I890 Lymphedema, not elsewhere classified: Secondary | ICD-10-CM | POA: Diagnosis not present

## 2015-11-18 DIAGNOSIS — E11620 Type 2 diabetes mellitus with diabetic dermatitis: Secondary | ICD-10-CM | POA: Diagnosis not present

## 2015-11-18 DIAGNOSIS — I2510 Atherosclerotic heart disease of native coronary artery without angina pectoris: Secondary | ICD-10-CM | POA: Diagnosis not present

## 2015-11-18 DIAGNOSIS — Z794 Long term (current) use of insulin: Secondary | ICD-10-CM | POA: Diagnosis not present

## 2015-11-18 DIAGNOSIS — E1151 Type 2 diabetes mellitus with diabetic peripheral angiopathy without gangrene: Secondary | ICD-10-CM | POA: Diagnosis not present

## 2015-11-18 DIAGNOSIS — I110 Hypertensive heart disease with heart failure: Secondary | ICD-10-CM | POA: Diagnosis not present

## 2015-11-18 DIAGNOSIS — I509 Heart failure, unspecified: Secondary | ICD-10-CM | POA: Insufficient documentation

## 2015-11-18 DIAGNOSIS — Z8673 Personal history of transient ischemic attack (TIA), and cerebral infarction without residual deficits: Secondary | ICD-10-CM | POA: Insufficient documentation

## 2015-11-18 DIAGNOSIS — E1140 Type 2 diabetes mellitus with diabetic neuropathy, unspecified: Secondary | ICD-10-CM | POA: Insufficient documentation

## 2015-11-18 DIAGNOSIS — E785 Hyperlipidemia, unspecified: Secondary | ICD-10-CM | POA: Insufficient documentation

## 2015-11-18 NOTE — Progress Notes (Addendum)
DEMARKO, LUMBARD ([id number]) Visit Report for 11/18/2015 Allergy List Details Patient Name: Adam Merritt, Adam Merritt Date of Service: 11/18/2015 8:00 AM Medical Record Number: [id number] Patient Account Number: [id number] Date of Birth/Sex: 14-Mar-1946 (70 y.o. Male) Treating RN: Cornell Barman Primary Care Physician: [name] Other Clinician: Referring Physician: [name] Treating Physician/Extender: Frann Rider in Treatment: 0 Allergies Active Allergies Corticosteroids (Glucocorticoids) Allergy Notes Electronic Signature(s) Signed: 11/18/2015 11:40:57 AM By: Gretta Cool, RN, BSN, Kim RN, BSN Entered By: Gretta Cool, RN, BSN, Kim on 11/18/2015 08:34:01 Adam Merritt ([id number]) -------------------------------------------------------------------------------- [place] Details Patient Name: Adam Merritt Date of Service: 11/18/2015 8:00 AM Medical Record Number: [id number] Patient Account Number: [id number] Date of Birth/Sex: Oct 04, 1945 (70 y.o. Male) Treating RN: Cornell Barman Primary Care Physician: [name] Other Clinician: Referring Physician: [name] Treating Physician/Extender: Frann Rider in Treatment: 0 Visit Information Patient Arrived: [name] Arrival Time: 08:09 Accompanied By: wife Transfer Assistance: None Patient Identification Verified: Yes Secondary Verification Process Yes Completed: Patient Requires Transmission- No Based Precautions: Patient Has Alerts: Yes Patient Alerts: Type Diabetic II Aspirin 325 mg ABI: Right Leg Non-compressibe >220 History Since Last Visit Added or deleted any medications: No Any new allergies or adverse reactions: No Had a fall or experienced change in activities of daily living that may affect risk of falls: No Electronic Signature(s) Signed: 11/18/2015 11:40:57 AM By: Gretta Cool, RN, BSN, Kim RN, BSN Entered By: Gretta Cool, RN, BSN, Kim on 11/18/2015 09:27:24 Adam Merritt  ([id number]) -------------------------------------------------------------------------------- Clinic Level of Care Assessment Details Patient Name: Adam Merritt Date of Service: 11/18/2015 8:00 AM Medical Record Number: [id number] Patient Account Number: [id number] Date of Birth/Sex: 11-Mar-1946 (70 y.o. Male) Treating RN: Cornell Barman Primary Care Physician: [name] Other Clinician: Referring Physician: [name] Treating Physician/Extender: Frann Rider in Treatment: 0 Clinic Level of Care Assessment Items TOOL 2 Quantity Score []  - Use when only an EandM is performed on the INITIAL visit 0 ASSESSMENTS - Nursing Assessment / Reassessment X - General Physical Exam (combine w/ comprehensive assessment (listed just 1 20 below) when performed on new pt. evals) X - Comprehensive Assessment (HX, ROS, Risk Assessments, Wounds Hx, etc.) 1 25 ASSESSMENTS - Wound and Skin Assessment / Reassessment []  - Simple Wound Assessment / Reassessment - one wound 0 []  - Complex Wound Assessment / Reassessment - multiple wounds 0 []  - Dermatologic / Skin Assessment (not related to wound area) 0 ASSESSMENTS - Ostomy and/or Continence Assessment and Care []  - Incontinence Assessment and Management 0 []  - Ostomy Care Assessment and Management (repouching, etc.) 0 PROCESS - Coordination of Care X - Simple Patient / Family Education for ongoing care 1 15 []  - Complex (extensive) Patient / Family Education for ongoing care 0 X - Staff obtains Programmer, systems, Records, Test Results / Process Orders 1 10 []  - Staff telephones HHA, Nursing Homes / Clarify orders / etc 0 []  - Routine Transfer to another Facility (non-emergent condition) 0 []  - Routine Hospital Admission (non-emergent condition) 0 []  - New Admissions / Biomedical engineer / Ordering NPWT, Apligraf, etc. 0 []  - Emergency Hospital Admission (emergent condition) 0 X - Simple Discharge Coordination 1 10 Adam Merritt, ARGUDO. ([id number]) []  -  Complex (extensive) Discharge Coordination 0 PROCESS - Special Needs []  - Pediatric / Minor Patient Management 0 []  - Isolation Patient Management 0 []  - Hearing / Language / Visual special needs 0 []  - Assessment of Community assistance (transportation, D/C planning, etc.) 0 []  - Additional assistance / Altered mentation 0 []  -  Support Surface(s) Assessment (bed, cushion, seat, etc.) 0 INTERVENTIONS - Wound Cleansing / Measurement []  - Wound Imaging (photographs - any number of wounds) 0 []  - Wound Tracing (instead of photographs) 0 []  - Simple Wound Measurement - one wound 0 []  - Complex Wound Measurement - multiple wounds 0 []  - Simple Wound Cleansing - one wound 0 []  - Complex Wound Cleansing - multiple wounds 0 INTERVENTIONS - Wound Dressings []  - Small Wound Dressing one or multiple wounds 0 []  - Medium Wound Dressing one or multiple wounds 0 []  - Large Wound Dressing one or multiple wounds 0 []  - Application of Medications - injection 0 INTERVENTIONS - Miscellaneous []  - External ear exam 0 []  - Specimen Collection (cultures, biopsies, blood, body fluids, etc.) 0 []  - Specimen(s) / Culture(s) sent or taken to Lab for analysis 0 []  - Patient Transfer (multiple staff / Harrel Lemon Lift / Similar devices) 0 []  - Simple Staple / Suture removal (25 or less) 0 []  - Complex Staple / Suture removal (26 or more) 0 Adam Merritt, BUDZYNSKI. ([id number]) []  - Hypo / Hyperglycemic Management (close monitor of Blood Glucose) 0 X - Ankle / Brachial Index (ABI) - do not check if billed separately 1 15 Has the patient been seen at the hospital within the last three years: Yes Total Score: 95 Level Of Care: New/Established - Level 3 Electronic Signature(s) Signed: 11/18/2015 11:40:57 AM By: Gretta Cool, RN, BSN, Kim RN, BSN Entered By: Gretta Cool, RN, BSN, Kim on 11/18/2015 09:02:17 Adam Merritt ([id number]) -------------------------------------------------------------------------------- Encounter Discharge  Information Details Patient Name: Adam Merritt Date of Service: 11/18/2015 8:00 AM Medical Record Number: [id number] Patient Account Number: [id number] Date of Birth/Sex: [birth year] (70 y.o. Male) Treating RN: Cornell Barman Primary Care Physician: [name] Other Clinician: Referring Physician: [name] Treating Physician/Extender: Frann Rider in Treatment: 0 Encounter Discharge Information Items Discharge Pain Level: 0 Discharge Condition: Stable Ambulatory Status: Ambulatory Discharge Destination: Home Transportation: Private Auto Accompanied By: wife Schedule Follow-up Appointment: Yes Medication Reconciliation completed and provided to Patient/Care No Mimie Goering: Provided on Clinical Summary of Care: 11/18/2015 Form Type Recipient Paper Patient JT Electronic Signature(s) Signed: 11/18/2015 9:06:02 AM By: Ruthine Dose Entered By: Ruthine Dose on 11/18/2015 [place] Adam Merritt ([id number]) -------------------------------------------------------------------------------- General Visit Notes Details Patient Name: Adam Merritt Date of Service: 11/18/2015 8:00 AM Medical Record Number: [id number] Patient Account Number: [id number] Date of Birth/Sex: [birth year] (70 y.o. Male) Treating RN: Cornell Barman Primary Care Physician: [name] Other Clinician: Referring Physician: [name] Treating Physician/Extender: Frann Rider in Treatment: 0 Notes Patient came in today with no wounds. He was referred to [place] for wounds across his toes. There are no open areas today. 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 I was unable to get ABI on left due to size of the leg and the right was non compressible >220. Referral for Arterial Studies will be made. Patient is instructed to follow up with [place] once the studies are complete. Patient also aware to call wound care center if any of the wounds open up. Electronic Signature(s) Signed: 11/18/2015 11:40:57 AM By: Gretta Cool,  RN, BSN, Kim RN, BSN Entered By: Gretta Cool, RN, BSN, Kim on 11/18/2015 09:43:45 Adam Merritt ([id number]) -------------------------------------------------------------------------------- Lower Extremity Assessment Details Patient Name: Adam Merritt, Adam Merritt Date of Service: 11/18/2015 8:00 AM Medical Record Number: [id number] Patient Account Number: [id number] Date of Birth/Sex: 1946-04-04 (70 y.o. Male) Treating RN: Cornell Barman Primary Care Physician: [name] Other Clinician: Referring Physician: [name] Treating Physician/Extender:  Britto, [name] [name]: 0 Vascular Assessment Pulses: Posterior Tibial Palpable: [Left:Yes] [Right:Yes] Doppler: [Left:Monophasic] [Right:Monophasic] Dorsalis Pedis Palpable: [Left:Yes] [Right:Yes] Doppler: [Left:Monophasic] [Right:Monophasic] Extremity colors, hair growth, and conditions: Extremity Color: [Left:Mottled] [Right:Mottled] Hair Growth on Extremity: [Left:No] [Right:No] Temperature of Extremity: [Left:Warm] [Right:Warm] Capillary Refill: [Left:> 3 seconds] [Right:> 3 seconds] Toe Nail Assessment Left: Right: Thick: No No Discolored: No No Deformed: Yes Yes Improper Length and Hygiene: Yes Yes Notes Right leg patient is non-compressible > 220. Left leg was to large, cuff would not fit patient. Electronic Signature(s) Signed: 11/18/2015 11:40:57 AM By: Gretta Cool, RN, BSN, Kim RN, BSN Entered By: Gretta Cool, RN, BSN, Kim on 11/18/2015 09:26:33 Adam Merritt, Adam Merritt ([id number]) -------------------------------------------------------------------------------- Multi Wound Chart Details Patient Name: Adam Merritt Date of Service: 11/18/2015 8:00 AM Medical Record Number: [id number] Patient Account Number: [id number] Date of Birth/Sex: 08-04-1945 (70 y.o. Male) Treating RN: Cornell Barman Primary Care Physician: [name] Other Clinician: Referring Physician: [name] Treating Physician/Extender: Frann Rider in Treatment: 0 Vital  Signs Height(in): 65 Pulse(bpm): 82 Weight(lbs): 333 Blood Pressure 166/90 (mmHg): Body Mass Index(BMI): 55 Temperature(F): 98.2 Respiratory Rate 20 (breaths/min): Wound Assessments Treatment Notes Electronic Signature(s) Signed: 11/18/2015 11:40:57 AM By: Gretta Cool, RN, BSN, Kim RN, BSN Entered By: Gretta Cool, RN, BSN, Kim on 11/18/2015 08:59:41 Adam Merritt ([id number]) -------------------------------------------------------------------------------- [place] Details Patient Name: Adam Merritt, MCFEELEY. Date of Service: 11/18/2015 8:00 AM Medical Record Number: [id number] Patient Account Number: [id number] Date of Birth/Sex: 01/22/1946 (70 y.o. Male) Treating RN: Cornell Barman Primary Care Physician: [name] Other Clinician: Referring Physician: [name] Treating Physician/Extender: Frann Rider in Treatment: 0 Active Inactive Electronic Signature(s) Signed: 11/26/2015 9:19:18 AM By: Gretta Cool, RN, BSN, Kim RN, BSN Previous Signature: 11/18/2015 11:40:57 AM Version By: Gretta Cool, RN, BSN, Kim RN, BSN Entered By: Gretta Cool, RN, BSN, Kim on 11/26/2015 09:19:18 Adam Merritt ([id number]) -------------------------------------------------------------------------------- Non-Wound Condition Assessment Details Patient Name: Adam Merritt Date of Service: 11/18/2015 8:00 AM Medical Record Number: [id number] Patient Account Number: [id number] Date of Birth/Sex: Feb 04, 1946 (70 y.o. Male) Treating RN: Cornell Barman Primary Care Physician: [name] Other Clinician: Referring Physician: [name] Treating Physician/Extender: Frann Rider in Treatment: 0 Non-Wound Condition: Condition: Lymphedema Location: Leg Side: Bilateral Periwound Skin Texture Texture Color No Abnormalities Noted: No No Abnormalities Noted: No Callus: No Atrophie Blanche: No Crepitus: No Cyanosis: No Excoriation: No Ecchymosis: No Fluctuance: No Erythema: No Friable: No Hemosiderin  Staining: Yes Induration: Yes Mottled: No Localized Edema: No Pallor: No Rash: No Rubor: No Scarring: No Temperature / Pain Moisture Temperature: No Abnormality No Abnormalities Noted: No Dry / Scaly: Yes Maceration: No Moist: No Electronic Signature(s) Signed: 11/18/2015 11:40:57 AM By: Gretta Cool, RN, BSN, Kim RN, BSN Entered By: Gretta Cool, RN, BSN, Kim on 11/18/2015 09:44:55 Adam Merritt ([id number]) -------------------------------------------------------------------------------- Pain Assessment Details Patient Name: Adam Merritt Date of Service: 11/18/2015 8:00 AM Medical Record Number: [id number] Patient Account Number: [id number] Date of Birth/Sex: July 05, 1945 (70 y.o. Male) Treating RN: Cornell Barman Primary Care Physician: [name] Other Clinician: Referring Physician: [name] Treating Physician/Extender: Frann Rider in Treatment: 0 Active Problems Location of Pain Severity and Description of Pain Patient Has Paino No Site Locations With Dressing Change: No Pain Management and Medication Current Pain Management: Electronic Signature(s) Signed: 11/18/2015 11:40:57 AM By: Gretta Cool, RN, BSN, Kim RN, BSN Entered By: Gretta Cool, RN, BSN, Kim on 11/18/2015 08:14:29 Adam Merritt ([id number]) -------------------------------------------------------------------------------- Patient/Caregiver Education Details Patient Name: Adam Merritt Date of Service: 11/18/2015 8:00 AM Medical  Record Number: [id number] Patient Account Number: [id number] Date of Birth/Gender: 1945-12-06 (70 y.o. Male) Treating RN: Cornell Barman Primary Care Physician: [name] Other Clinician: Referring Physician: [name] Treating Physician/Extender: Frann Rider in Treatment: 0 Education Assessment Education Provided To: Patient Education Topics Provided Welcome To [facility]: Handouts: Welcome To [facility] Methods: Demonstration Responses: State content  correctly Electronic Signature(s) Signed: 11/18/2015 11:40:57 AM By: Gretta Cool, RN, BSN, Kim RN, BSN Entered By: Gretta Cool, RN, BSN, Kim on 11/18/2015 09:02:50 Adam Merritt ([id number]) -------------------------------------------------------------------------------- [place] Details Patient Name: Adam Merritt Date of Service: 11/18/2015 8:00 AM Medical Record Number: [id number] Patient Account Number: [id number] Date of Birth/Sex: 1945-12-04 (70 y.o. Male) Treating RN: Cornell Barman Primary Care Physician: [name] Other Clinician: Referring Physician: [name] Treating Physician/Extender: Frann Rider in Treatment: 0 Vital Signs Time Taken: 08:14 Temperature (F): 98.2 Height (in): 65 Pulse (bpm): 82 Weight (lbs): 333 Respiratory Rate (breaths/min): 20 Body Mass Index (BMI): 55.4 Blood Pressure (mmHg): 166/90 Reference Range: 80 - 120 mg / dl Notes Patient states he has not taken BP meds today. Electronic Signature(s) Signed: 11/18/2015 11:40:57 AM By: Gretta Cool, RN, BSN, Kim RN, BSN Entered By: Gretta Cool, RN, BSN, Kim on 11/18/2015 08:16:07

## 2015-11-18 NOTE — Progress Notes (Signed)
DARRIS, BIPPUS ([id number]) Visit Report for 11/18/2015 Chief Complaint Document Details Patient Name: Adam Merritt, Adam Merritt Date of Service: 11/18/2015 8:00 AM Medical Record Number: [id number] Patient Account Number: [id number] Date of Birth/Sex: Feb 28, 1946 (70 y.o. Male) Treating RN: Cornell Barman Primary Care Physician: [name] Other Clinician: Referring Physician: [name] Treating Physician/Extender: Frann Rider in Treatment: 0 Information Obtained from: Patient Chief Complaint the patient was a poor historian comes to the wound center sent by his PCP for possible open wounds of his toes and bluish discoloration of them on and off for the last several weeks. Electronic Signature(s) Signed: 11/18/2015 9:22:08 AM By: Christin Fudge MD, FACS Entered By: Christin Fudge on 11/18/2015 09:22:08 Adam Merritt ([id number]) -------------------------------------------------------------------------------- HPI Details Patient Name: Adam Merritt Date of Service: 11/18/2015 8:00 AM Medical Record Number: [id number] Patient Account Number: [id number] Date of Birth/Sex: 07-26-1945 (70 y.o. Male) Treating RN: Cornell Barman Primary Care Physician: [name] Other Clinician: Referring Physician: [name] Treating Physician/Extender: Frann Rider in Treatment: 0 History of Present Illness Location: bilateral lower leg edema with some blisters recently near the toes of his right foot. Quality: Patient reports experiencing a dull pain to affected area(s). Severity: Patient states wound (s) are getting better. Duration: Patient has had the wound for < 2 weeks prior to presenting for treatment Timing: Pain in wound is Intermittent (comes and goes Context: The wound appeared gradually over time Modifying Factors: Consults to this date include: he has been seen by his PCP and she sent him off to see [place]. Associated Signs and Symptoms: Patient reports having difficulty standing for long  periods. HPI Description: 70 year old gentleman who comes with bilateral lower limb edema for over 20 years and recent attack of blisters and redness for about 2 weeks. The patient says he has had bilateral lower limb edema since 1996. He has been noncompliant with wearing wraps in the past and even was given a boot to wear at night which she never tolerated and didn't wear it. He has a past medical history of sleep apnea, neuropathy with diabetes, hypertension, hyperlipidemia, coronary artery disease, varicose veins, carotid artery disease and is status post CABG and cholecystectomy. He was seen by the vascular surgeon on 09/29/2013 and Dr. Trula Slade opinion was:"I have reviewed his outside ultrasound which shows an ankle-brachial index of 0.72 on the left and 1.0 on the right.o This study was repeated at our office.o The ankle-brachial index on the right was 1.26 with triphasic waveforms.o On the left it was a 1.04 with biphasic waveforms.o The patient had a toe pressure of 121 on the right and 100 on the left. In summary, I would not recommend any further imaging , as I do not think his symptoms are related to arterial insufficiency, but rather secondary to diabetic neuropathy.o The patient still has room for increasing his Neurontin dose, which he states does help him.o I have scheduled him to come back in one year for a repeat arterial evaluation." in June 2016, a year ago, he has a venous duplex study to be done. He was also going to be accepted to the lymphedema clinic but I did not believe the patient went regularly for this. Electronic Signature(s) Signed: 11/18/2015 9:32:04 AM By: Christin Fudge MD, FACS Previous Signature: 11/18/2015 9:24:02 AM Version By: Christin Fudge MD, FACS Entered By: Christin Fudge on 11/18/2015 09:32:04 Adam Merritt ([id number]) -------------------------------------------------------------------------------- Physical Exam Details Patient Name: Adam Merritt Date of Service: 11/18/2015 8:00 AM Medical Record  Number: [id number] Patient Account Number: [id number] Date of Birth/Sex: 06-12-1945 (70 y.o. Male) Treating RN: Cornell Barman Primary Care Physician: [name] Other Clinician: Referring Physician: [name] Treating Physician/Extender: Frann Rider in Treatment: 0 Constitutional . Pulse regular. Respirations normal and unlabored. Afebrile. . Eyes Nonicteric. Reactive to light. Ears, Nose, Mouth, and Throat Lips, teeth, and gums WNL.Marland Kitchen Moist mucosa without lesions. Neck supple and nontender. No palpable supraclavicular or cervical adenopathy. Normal sized without goiter. Respiratory WNL. No retractions.. Cardiovascular Pedal Pulses WNL. the right lower extremity was noncompressible while trying to check his ABIs. No clubbing, cyanosis or edema. Chest Breasts symmetical and no nipple discharge.. Breast tissue WNL, no masses, lumps, or tenderness.. Gastrointestinal (GI) Abdomen without masses or tenderness.. No liver or spleen enlargement or tenderness.. Lymphatic No adneopathy. No adenopathy. No adenopathy. Musculoskeletal Adexa without tenderness or enlargement.. Digits and nails w/o clubbing, cyanosis, infection, petechiae, ischemia, or inflammatory conditions.. Integumentary (Hair, Skin) No suspicious lesions. No crepitus or fluctuance. No peri-wound warmth or erythema. No masses.Marland Kitchen Psychiatric Judgement and insight Intact.. No evidence of depression, anxiety, or agitation.. Notes The patient has bilateral lymphedema stage III on the left and stage II on the right. He has no open ulcerations. The areas on his toes where he had open wounds have all now healed. No evidence of any skin ulcerations or lesions. Electronic Signature(s) Signed: 11/18/2015 9:34:21 AM By: Christin Fudge MD, FACS Adam Merritt, Adam Merritt ([id number]) Entered By: Christin Fudge on 11/18/2015 09:34:21 Adam Merritt  ([id number]) -------------------------------------------------------------------------------- Physician Orders Details Patient Name: Adam Merritt Date of Service: 11/18/2015 8:00 AM Medical Record Number: [id number] Patient Account Number: [id number] Date of Birth/Sex: 01-20-1946 (70 y.o. Male) Treating RN: Cornell Barman Primary Care Physician: [name] Other Clinician: Referring Physician: [name] Treating Physician/Extender: Frann Rider in Treatment: 0 Verbal / Phone Orders: Yes Clinician: Cornell Barman Read Back and Verified: Yes Diagnosis Coding Follow-up Appointments o Other: - Return after Arterial studies are complete. Consults o Cardiology - Arterial Studies Electronic Signature(s) Signed: 11/18/2015 11:40:57 AM By: Gretta Cool, RN, BSN, Kim RN, BSN Signed: 11/18/2015 3:21:55 PM By: Christin Fudge MD, FACS Entered By: Gretta Cool RN, BSN, Kim on 11/18/2015 09:00:58 Adam Merritt ([id number]) -------------------------------------------------------------------------------- Problem List Details Patient Name: Adam Merritt, Adam Merritt. Date of Service: 11/18/2015 8:00 AM Medical Record Number: [id number] Patient Account Number: [id number] Date of Birth/Sex: 1946-06-01 (70 y.o. 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 11/18/2015 Yes E11.51 Type 2 diabetes mellitus with diabetic peripheral 11/18/2015 Yes angiopathy without gangrene I73.9 Peripheral vascular disease, unspecified 11/18/2015 Yes I89.0 Lymphedema, not elsewhere classified 11/18/2015 Yes E66.01 Morbid (severe) obesity due to excess calories 11/18/2015 Yes Inactive Problems Resolved Problems Electronic Signature(s) Signed: 11/18/2015 9:54:39 AM By: Christin Fudge MD, FACS Previous Signature: 11/18/2015 9:21:13 AM Version By: Christin Fudge MD, FACS Entered By: Christin Fudge on 11/18/2015 09:54:38 Adam Merritt ([id number]) -------------------------------------------------------------------------------- Progress Note Details Patient Name: Adam Merritt Date of Service: 11/18/2015 8:00 AM Medical Record Number: [id number] Patient Account Number: [id number] Date of Birth/Sex: 1945/12/15 (70 y.o. Male) Treating RN: Cornell Barman Primary Care Physician: [name] Other Clinician: Referring Physician: [name] Treating Physician/Extender: Frann Rider in Treatment: 0 Subjective Chief Complaint Information obtained from Patient the patient was a poor historian comes to the wound center sent by his PCP  for possible open wounds of his toes and bluish discoloration of them on and off for the last several weeks. History of Present Illness (HPI) The following HPI elements were documented for the patient's wound: Location: bilateral lower leg edema with some blisters recently near the toes of his right foot. Quality: Patient reports experiencing a dull pain to affected area(s). Severity: Patient states wound (s) are getting better. Duration: Patient has had the wound for < 2 weeks prior to presenting for treatment Timing: Pain in wound is Intermittent (comes and goes Context: The wound appeared gradually over time Modifying Factors: Consults to this date include: he has been seen by his PCP and she sent him off to see [place]. Associated Signs and Symptoms: Patient reports having difficulty standing for long periods. 70 year old gentleman who comes with bilateral lower limb edema for over 20 years and recent attack of blisters and redness for about 2 weeks. The patient says he has had bilateral lower limb edema since 1996. He has been noncompliant with wearing wraps in the past and even was given a boot to wear at night which she never tolerated  and didn't wear it. He has a past medical history of sleep apnea, neuropathy with diabetes, hypertension, hyperlipidemia, coronary artery disease, varicose veins, carotid artery disease and is status post CABG and cholecystectomy. He was seen by the vascular surgeon on 09/29/2013 and Dr. Trula Slade opinion was:"I have reviewed his outside ultrasound which shows an ankle-brachial index of 0.72 on the left and 1.0 on the right. This study was repeated at our office. The ankle-brachial index on the right was 1.26 with triphasic waveforms. On the left it was a 1.04 with biphasic waveforms. The patient had a toe pressure of 121 on the right and 100 on the left. In summary, I would not recommend any further imaging , as I do not think his symptoms are related to arterial insufficiency, but rather secondary to diabetic neuropathy. The patient still has room for increasing his Neurontin dose, which he states does help him. I have scheduled him to come back in one year for a repeat arterial evaluation." in June 2016, a year ago, he has a venous duplex study to be done. He was also going to be accepted to the lymphedema clinic but I did not believe the patient went regularly for this. Adam Merritt, Adam Merritt ([id number]) Patient History Information obtained from Patient. Allergies Corticosteroids (Glucocorticoids) Family History Cancer - Paternal Grandparents, Kidney Disease - Siblings, Lung Disease - Mother, No family history of Diabetes, Heart Disease, Hereditary Spherocytosis, Hypertension, Seizures, Stroke, Thyroid Problems, Tuberculosis. Social History Never smoker, Marital Status - Married, Alcohol Use - Never, Drug Use - No History, Caffeine Use - Never. Medical History Endocrine Patient has history of Type II Diabetes Denies history of Type I Diabetes Patient is treated with Insulin. Blood sugar is tested. Blood sugar results noted at the following times: Breakfast - 209. Hospitalization/Surgery  History - 11/03/2013, [facility], bronchitis. Medical And Surgical History Notes Cardiovascular varicose veins Neurologic CVA - 1992 Review of Systems (ROS) Eyes The patient has no complaints or symptoms. Ear/Nose/Mouth/Throat The patient has no complaints or symptoms. Hematologic/Lymphatic The patient has no complaints or symptoms. Cardiovascular The patient has no complaints or symptoms. Gastrointestinal The patient has no complaints or symptoms. Endocrine The patient has no complaints or symptoms. Genitourinary The patient has no complaints or symptoms. Immunological The patient has no complaints or symptoms. Integumentary (Skin) The patient has no complaints or symptoms. Musculoskeletal Adam Merritt, Adam  W. ([id number]) The patient has no complaints or symptoms. Neurologic The patient has no complaints or symptoms. Oncologic The patient has no complaints or symptoms. Psychiatric The patient has no complaints or symptoms. Medications carvedilol 25 mg tablet oral 1 1 tablet oral two times daily aspirin 325 mg tablet,delayed release oral 1 1 tablet,delayed release (DR/EC) oral daily gabapentin 400 mg capsule oral 2 2 capsules oral (800mg .) three times daily Victoza 3-Pak 0.6 mg/0.1 mL (18 mg/3 mL) subcutaneous pen injector subcutaneous pen injector subcutaneous inject 1.18 units daily Lipitor 40 mg tablet oral 1 1 tablet oral daily hydralazine 25 mg tablet oral 1 1 tablet oral three times daily Requip 0.25 mg tablet oral 1 1 tablet oral nightly ProAir HFA 90 mcg/actuation aerosol inhaler inhalation 2 2 PUFFS HFA aerosol inhaler inhalation every four to six hours as needed pentoxifylline ER 400 mg tablet,extended release oral 1 1 tablet extended release oral three times daily Humalog KwikPen 100 unit/mL subcutaneous subcutaneous insulin pen subcutaneous use per sliding scale docusate sodium 100 mg capsule oral 2 2 capsule oral DAILY Dulcolax (bisacodyl) 5 mg tablet,delayed release  oral 2 2 tablet,delayed release (DR/EC) oral daily as needed furosemide 40 mg tablet oral 1 1 tablet oral daily multivitamin tablet oral 1 1 tablet oral daily oxycodone-acetaminophen 7.5 mg-325 mg tablet oral 1 1 tablet oral three to four times daily as needed ibuprofen 600 mg tablet oral 1 1 tablet oral two times daily as needed omeprazole 20 mg capsule,delayed release oral 1 1 capsule,delayed release(DR/EC) oral daily zolpidem ER 12.5 mg tablet,extended release,multiphase oral 1 1 tablet,ext release multiphase oral nightly as needed clobetasol 0.05 % topical ointment topical ointment topical apply to bilateral lower legs two times daily Objective Constitutional Pulse regular. Respirations normal and unlabored. Afebrile. Vitals Time Taken: 8:14 AM, Height: 65 in, Weight: 333 lbs, BMI: 55.4, Temperature: 98.2 F, Pulse: 82 bpm, Respiratory Rate: 20 breaths/min, Blood Pressure: 166/90 mmHg. General Notes: Patient states he has not taken BP meds today. Adam Merritt, Adam Merritt ([id number]) Eyes Nonicteric. Reactive to light. Ears, Nose, Mouth, and Throat Lips, teeth, and gums WNL.Marland Kitchen Moist mucosa without lesions. Neck supple and nontender. No palpable supraclavicular or cervical adenopathy. Normal sized without goiter. Respiratory WNL. No retractions.. Cardiovascular Pedal Pulses WNL. the right lower extremity was noncompressible while trying to check his ABIs. No clubbing, cyanosis or edema. Chest Breasts symmetical and no nipple discharge.. Breast tissue WNL, no masses, lumps, or tenderness.. Gastrointestinal (GI) Abdomen without masses or tenderness.. No liver or spleen enlargement or tenderness.. Lymphatic No adneopathy. No adenopathy. No adenopathy. Musculoskeletal Adexa without tenderness or enlargement.. Digits and nails w/o clubbing, cyanosis, infection, petechiae, ischemia, or inflammatory conditions.Marland Kitchen Psychiatric Judgement and insight Intact.. No evidence of depression, anxiety, or  agitation.. General Notes: The patient has bilateral lymphedema stage III on the left and stage II on the right. He has no open ulcerations. The areas on his toes where he had open wounds have all now healed. No evidence of any skin ulcerations or lesions. Integumentary (Hair, Skin) No suspicious lesions. No crepitus or fluctuance. No peri-wound warmth or erythema. No masses.. Other Condition(s) Patient presents with Lymphedema located on the Bilateral Leg. The skin appearance exhibited: Dry/Scaly, Hemosiderin Staining, Induration. The skin appearance did not exhibit: Atrophie Blanche, Callus, Crepitus, Cyanosis, Ecchymosis, Erythema, Excoriation, Fluctuance, Friable, Localized Edema, Maceration, Moist, Mottled, Pallor, Rash, Rubor, Scarring. Skin temperature was noted as No Abnormality. Assessment Adam Merritt, Adam Merritt ([id number]) Active Problems ICD-10 E11.620 - Type 2 diabetes mellitus with diabetic dermatitis  E11.51 - Type 2 diabetes mellitus with diabetic peripheral angiopathy without gangrene I73.9 - Peripheral vascular disease, unspecified I89.0 - Lymphedema, not elsewhere classified E66.01 - Morbid (severe) obesity due to excess calories 70 year old gentleman has been worked up in the past both arterial and for possible DVTs of his legs,was seen by me last year. Vascular opinion was that most of his pain in his legs is due to neuropathy due to his diabetes and his lymphedema needed to treated with chronic compression and lymphedema pumps but the patient was not compliant. He had referred him to the lymphedema clinic and he was accepted but he did not follow-up with them. At this stage he has agreed to get a repeat arterial study done and he has seen Dr. Trula Slade in [place]. I had also recommend venous Doppler studies to see if he has any significant varicose veins, last year and I believe he may have had them done and we will check on these reports. I was asked to come back and see  me at regular intervals and he is agreeable about this. Plan Follow-up Appointments: Other: - Return after Arterial studies are complete. Consults ordered were: Cardiology - Arterial Studies 70 year old gentleman has been worked up in the past both arterial and for possible DVTs of his legs,was seen by me last year. Vascular opinion was that most of his pain in his legs is due to neuropathy due to his diabetes and his lymphedema needed to treated with chronic compression and lymphedema pumps but the patient was not compliant. He had referred him to the lymphedema clinic and he was accepted but he did not follow-up with them. Adam Merritt, Adam Merritt ([id number]) At this stage he has agreed to get a repeat arterial study done and he has seen Dr. Trula Slade in [place]. I had also recommend venous Doppler studies to see if he has any significant varicose veins, last year and I believe he may have had them done and we will check on these reports. I was asked to come back and see me at regular intervals and he is agreeable about this. Electronic Signature(s) Signed: 11/18/2015 9:55:05 AM By: Christin Fudge MD, FACS Previous Signature: 11/18/2015 9:54:53 AM Version By: Christin Fudge MD, FACS Previous Signature: 11/18/2015 9:38:34 AM Version By: Christin Fudge MD, FACS Entered By: Christin Fudge on 11/18/2015 09:55:05 Adam Merritt ([id number]) -------------------------------------------------------------------------------- ROS/PFSH Details Patient Name: Adam Merritt Date of Service: 11/18/2015 8:00 AM Medical Record Number: [id number] Patient Account Number: [id number] Date of Birth/Sex: April 25, 1946 (70 y.o. Male) Treating RN: Cornell Barman Primary Care Physician: [name] Other Clinician: Referring Physician: [name] Treating Physician/Extender: Frann Rider in Treatment: 0 Information Obtained From Patient Wound History Do you currently have one or more open woundso No Eyes Complaints  and Symptoms: No Complaints or Symptoms Medical History: Negative for: Cataracts; Glaucoma; Optic Neuritis Ear/Nose/Mouth/Throat Complaints and Symptoms: No Complaints or Symptoms Medical History: Negative for: Chronic sinus problems/congestion; Middle ear problems Hematologic/Lymphatic Complaints and Symptoms: No Complaints or Symptoms Medical History: Positive for: Anemia - aplastic anemia; Lymphedema Negative for: Hemophilia; Human Immunodeficiency Virus; Sickle Cell Disease Respiratory Medical History: Positive for: Chronic Obstructive Pulmonary Disease (COPD); Sleep Apnea Negative for: Aspiration; Asthma; Pneumothorax; Tuberculosis Cardiovascular Complaints and Symptoms: No Complaints or Symptoms Medical History: Positive for: Congestive Heart Failure; Coronary Artery Disease; Hypertension; Peripheral Venous Adam Merritt, SCHULL. ([id number]) Disease Negative for: Angina; Arrhythmia; Deep Vein Thrombosis; Hypotension; Myocardial Infarction; Peripheral Arterial Disease; Phlebitis; Vasculitis Past Medical History Notes: varicose veins Gastrointestinal Complaints  and Symptoms: No Complaints or Symptoms Medical History: Negative for: Cirrhosis ; Colitis; Crohnos; Hepatitis A; Hepatitis B; Hepatitis C Endocrine Complaints and Symptoms: No Complaints or Symptoms Medical History: Positive for: Type II Diabetes Negative for: Type I Diabetes Time with diabetes: since 2006 Treated with: Insulin Blood sugar tested every day: Yes Tested : Blood sugar testing results: Breakfast: 209 Genitourinary Complaints and Symptoms: No Complaints or Symptoms Medical History: Negative for: End Stage Renal Disease Immunological Complaints and Symptoms: No Complaints or Symptoms Medical History: Negative for: Lupus Erythematosus; Raynaudos; Scleroderma Integumentary (Skin) Complaints and Symptoms: No Complaints or Symptoms Medical History: JYREE, DEE ([id number]) Negative for:  History of Burn; History of pressure wounds Musculoskeletal Complaints and Symptoms: No Complaints or Symptoms Medical History: Positive for: Osteoarthritis Negative for: Gout; Rheumatoid Arthritis; Osteomyelitis Neurologic Complaints and Symptoms: No Complaints or Symptoms Medical History: Positive for: Neuropathy Negative for: Dementia; Quadriplegia; Paraplegia; Seizure Disorder Past Medical History Notes: CVA - 1992 Oncologic Complaints and Symptoms: No Complaints or Symptoms Medical History: Positive for: Received Chemotherapy - last dose 2006 Negative for: Received Radiation Psychiatric Complaints and Symptoms: No Complaints or Symptoms Medical History: Negative for: Anorexia/bulimia; Confinement Anxiety Hospitalization / Surgery History Name of Hospital Purpose of Hospitalization/Surgery Date [facility] bronchitis 11/03/2013 Family and Social History Cancer: Yes - Paternal Grandparents; Diabetes: No; Heart Disease: No; Hereditary Spherocytosis: No; Hypertension: No; Kidney Disease: Yes - Siblings; Lung Disease: Yes - Mother; Seizures: No; Stroke: No; Thyroid Problems: No; Tuberculosis: No; Never smoker; Marital Status - Married; Alcohol Use: Never; Drug Use: No History; Caffeine Use: Never; Financial Concerns: No; Food, Clothing or Shelter Needs: No; Support System Lacking: No; Transportation Concerns: No; Advanced Directives: No; Patient does not want information on [place]. ([id number]) I have reviewed and agree with the above information. Electronic Signature(s) Signed: 11/18/2015 9:54:15 AM By: Christin Fudge MD, FACS Signed: 11/18/2015 11:40:57 AM By: Gretta Cool RN, BSN, Kim RN, BSN Entered By: Christin Fudge on 11/18/2015 09:54:15 Adam Merritt ([id number]) -------------------------------------------------------------------------------- [place] Details Patient Name: Adam Merritt Date of Service: 11/18/2015 Medical Record  Number: [id number] Patient Account Number: [id number] Date of Birth/Sex: 04/25/1946 (70 y.o. 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 gangrene I73.9 Peripheral vascular disease, unspecified I89.0 Lymphedema, not elsewhere classified Facility Procedures CPT4 Code: [id number] Description: 99213 - WOUND CARE VISIT-LEV 3 EST PT Modifier: Quantity: 1 Physician Procedures CPT4: Description Modifier Quantity Code [id number] - WC PHYS LEVEL 4 - EST PT 1 ICD-10 Description Diagnosis 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

## 2015-11-18 NOTE — Progress Notes (Signed)
COLTRANE, TIRABASSI ([id number]) Visit Report for 11/18/2015 Abuse/Suicide Risk Screen Details Patient Name: Adam Merritt, Adam Merritt Date of Service: 11/18/2015 8:00 AM Medical Record Number: [id number] Patient Account Number: [id number] Date of Birth/Sex: 1946-01-16 (70 y.o. Male) Treating RN: Cornell Barman Primary Care Physician: [name] Other Clinician: Referring Physician: [name] Treating Physician/Extender: Frann Rider in Treatment: 0 Abuse/Suicide Risk Screen Items Answer ABUSE/SUICIDE RISK SCREEN: Has anyone close to you tried to hurt or harm you recentlyo No Do you feel uncomfortable with anyone in your familyo No Has anyone forced you do things that you didnot want to doo No Do you have any thoughts of harming yourselfo No Patient displays signs or symptoms of abuse and/or neglect. No Electronic Signature(s) Signed: 11/18/2015 11:40:57 AM By: Gretta Cool, RN, BSN, Kim RN, BSN Entered By: Gretta Cool, RN, BSN, Kim on 11/18/2015 08:20:55 Adam Merritt ([id number]) -------------------------------------------------------------------------------- Activities of Daily Living Details Patient Name: Adam Merritt, Adam Merritt Date of Service: 11/18/2015 8:00 AM Medical Record Number: [id number] Patient Account Number: [id number] Date of Birth/Sex: 05/29/1946 (70 y.o. Male) Treating RN: Cornell Barman Primary Care Physician: [name] Other Clinician: Referring Physician: [name] Treating Physician/Extender: Frann Rider in Treatment: 0 Activities of Daily Living Items Answer Activities of Daily Living (Please select one for each item) Drive Automobile Completely Able Take Medications Completely Able Use Telephone Completely Able Care for Appearance Completely Able Use Toilet Completely Able Bath / Shower Completely Able Dress Self Completely Able Feed Self Completely Able Walk Completely Able Get In / Out Bed Completely Able Housework Completely Able Prepare Meals Completely  [place] for Self Completely Able Electronic Signature(s) Signed: 11/18/2015 11:40:57 AM By: Gretta Cool, RN, BSN, Kim RN, BSN Entered By: Gretta Cool, RN, BSN, Kim on 11/18/2015 08:21:08 Adam Merritt ([id number]) -------------------------------------------------------------------------------- Education Assessment Details Patient Name: Adam Merritt Date of Service: 11/18/2015 8:00 AM Medical Record Number: [id number] Patient Account Number: [id number] Date of Birth/Sex: [birth year] (70 y.o. Male) Treating RN: Cornell Barman Primary Care Physician: [name] Other Clinician: Referring Physician: [name] Treating Physician/Extender: Frann Rider in Treatment: 0 Primary Learner Assessed: Patient Learning Preferences/Education Level/Primary Language Learning Preference: Explanation Highest Education Level: High School Preferred Language: English Cognitive Barrier Assessment/Beliefs Language Barrier: No Translator Needed: No Memory Deficit: No Emotional Barrier: No Cultural/Religious Beliefs Affecting Medical No Care: Physical Barrier Assessment Impaired Vision: No Impaired Hearing: No Knowledge/Comprehension Assessment Knowledge Level: High Comprehension Level: High Ability to understand written High instructions: Ability to understand verbal High instructions: Motivation Assessment Anxiety Level: Calm Cooperation: Cooperative Education Importance: Acknowledges Need Interest in Health Problems: Asks Questions Perception: Coherent Willingness to Engage in Self- High Management Activities: Readiness to Engage in Self- High Management Activities: Electronic Signature(s) Signed: 11/18/2015 11:40:57 AM By: Gretta Cool, RN, BSN, Kim RN, BSN [address], [place] ([id number]) Entered By: Gretta Cool, RN, BSN, Kim on 11/18/2015 08:21:32 Adam Merritt, Adam Merritt ([id number]) -------------------------------------------------------------------------------- Fall Risk  Assessment Details Patient Name: Adam Merritt Date of Service: 11/18/2015 8:00 AM Medical Record Number: [id number] Patient Account Number: [id number] Date of Birth/Sex: 01-24-1946 (70 y.o. Male) Treating RN: Cornell Barman Primary Care Physician: [name] Other Clinician: Referring Physician: [name] Treating Physician/Extender: Frann Rider in Treatment: 0 Fall Risk Assessment Items Have you had 2 or more falls in the last 12 monthso 0 No Have you had any fall that resulted in injury in the last 12 monthso 0 No FALL RISK ASSESSMENT: History of falling - immediate or within 3 months 0 No Secondary diagnosis  0 No Ambulatory aid None/bed rest/wheelchair/nurse 0 No Crutches/cane/walker 15 Yes Furniture 0 No IV Access/Saline Lock 0 No Gait/Training Normal/bed rest/immobile 0 Yes Weak 0 No Impaired 0 No Mental Status Oriented to own ability 0 Yes Electronic Signature(s) Signed: 11/18/2015 11:40:57 AM By: Gretta Cool, RN, BSN, Kim RN, BSN Entered By: Gretta Cool, RN, BSN, Kim on 11/18/2015 08:22:04 Adam Merritt ([id number]) -------------------------------------------------------------------------------- Foot Assessment Details Patient Name: Adam Merritt Date of Service: 11/18/2015 8:00 AM Medical Record Number: [id number] Patient Account Number: [id number] Date of Birth/Sex: [birth year] (70 y.o. Male) Treating RN: Cornell Barman Primary Care Physician: [name] Other Clinician: Referring Physician: [name] Treating Physician/Extender: Frann Rider in Treatment: 0 Foot Assessment Items Site Locations + = Sensation present, - = Sensation absent, C = Callus, U = Ulcer R = Redness, W = Warmth, M = Maceration, PU = Pre-ulcerative lesion F = Fissure, S = Swelling, D = Dryness Assessment Right: Left: Other Deformity: No No Prior Foot Ulcer: No No Prior Amputation: No No Charcot Joint: No No Ambulatory Status: Ambulatory With Help Assistance Device: Cane Gait:  Steady Electronic Signature(s) Signed: 11/18/2015 11:40:57 AM By: Gretta Cool, RN, BSN, Kim RN, BSN Entered By: Gretta Cool, RN, BSN, Kim on 11/18/2015 08:23:12 Adam Merritt ([id number]) -------------------------------------------------------------------------------- Nutrition Risk Assessment Details Patient Name: Adam Merritt Date of Service: 11/18/2015 8:00 AM Medical Record Number: [id number] Patient Account Number: [id number] Date of Birth/Sex: 09-Sep-1945 (70 y.o. Male) Treating RN: Cornell Barman Primary Care Physician: [name] Other Clinician: Referring Physician: [name] Treating Physician/Extender: Frann Rider in Treatment: 0 Height (in): 65 Weight (lbs): 333 Body Mass Index (BMI): 55.4 Nutrition Risk Assessment Items NUTRITION RISK SCREEN: I have an illness or condition that made me change the kind and/or 0 No amount of food I eat I eat fewer than two meals per day 0 No I eat few fruits and vegetables, or milk products 0 No I have three or more drinks of beer, liquor or wine almost every day 0 No I have tooth or mouth problems that make it hard for me to eat 0 No I don't always have enough money to buy the food I need 0 No I eat alone most of the time 0 No I take three or more different prescribed or over-the-counter drugs a 1 Yes day Without wanting to, I have lost or gained 10 pounds in the last six 0 No months I am not always physically able to shop, cook and/or feed myself 0 No Nutrition Protocols Good Risk Protocol Provide education on elevated blood sugars Moderate Risk Protocol 0 and impact on wound healing, as applicable Electronic Signature(s) Signed: 11/18/2015 11:40:57 AM By: Gretta Cool, RN, BSN, Kim RN, BSN Entered By: Gretta Cool, RN, BSN, Kim on 11/18/2015 [id number]

## 2015-12-01 ENCOUNTER — Other Ambulatory Visit: Payer: Self-pay

## 2015-12-01 DIAGNOSIS — I739 Peripheral vascular disease, unspecified: Secondary | ICD-10-CM

## 2015-12-21 ENCOUNTER — Other Ambulatory Visit
Admission: RE | Admit: 2015-12-21 | Discharge: 2015-12-21 | Disposition: A | Discharge status: Home / Self Care | Payer: Medicare Other | Source: Ambulatory Visit | Attending: Nurse Practitioner | Admitting: Nurse Practitioner

## 2015-12-21 DIAGNOSIS — I739 Peripheral vascular disease, unspecified: Secondary | ICD-10-CM | POA: Diagnosis not present

## 2015-12-21 DIAGNOSIS — R5383 Other fatigue: Secondary | ICD-10-CM | POA: Insufficient documentation

## 2015-12-21 DIAGNOSIS — E1165 Type 2 diabetes mellitus with hyperglycemia: Secondary | ICD-10-CM | POA: Diagnosis not present

## 2015-12-21 DIAGNOSIS — I10 Essential (primary) hypertension: Secondary | ICD-10-CM | POA: Diagnosis not present

## 2015-12-21 DIAGNOSIS — M129 Arthropathy, unspecified: Secondary | ICD-10-CM | POA: Insufficient documentation

## 2015-12-21 DIAGNOSIS — D619 Aplastic anemia, unspecified: Secondary | ICD-10-CM | POA: Insufficient documentation

## 2015-12-21 DIAGNOSIS — L03119 Cellulitis of unspecified part of limb: Secondary | ICD-10-CM | POA: Diagnosis not present

## 2015-12-21 DIAGNOSIS — M25579 Pain in unspecified ankle and joints of unspecified foot: Secondary | ICD-10-CM | POA: Diagnosis not present

## 2015-12-21 LAB — COMPREHENSIVE METABOLIC PANEL
ALK PHOS: 70 U/L (ref 38–126)
ALT: 27 U/L (ref 17–63)
ANION GAP: 8 (ref 5–15)
AST: 27 U/L (ref 15–41)
Albumin: 3.7 g/dL (ref 3.5–5.0)
BILIRUBIN TOTAL: 0.5 mg/dL (ref 0.3–1.2)
BUN: 11 mg/dL (ref 6–20)
CALCIUM: 8.9 mg/dL (ref 8.9–10.3)
CO2: 26 mmol/L (ref 22–32)
CREATININE: 1.35 mg/dL — AB (ref 0.61–1.24)
Chloride: 104 mmol/L (ref 101–111)
GFR, EST AFRICAN AMERICAN: 59 mL/min — AB (ref >60)
GFR, EST NON AFRICAN AMERICAN: 51 mL/min — AB (ref >60)
Glucose, Bld: 129 mg/dL — ABNORMAL HIGH (ref 65–99)
Potassium: 3.4 mmol/L — ABNORMAL LOW (ref 3.5–5.1)
SODIUM: 138 mmol/L (ref 135–145)
TOTAL PROTEIN: 6.7 g/dL (ref 6.5–8.1)

## 2015-12-21 LAB — CBC
HCT: 41.3 % (ref 40.0–52.0)
HEMOGLOBIN: 14.3 g/dL (ref 13.0–18.0)
MCH: 32.9 pg (ref 26.0–34.0)
MCHC: 34.7 g/dL (ref 32.0–36.0)
MCV: 95 fL (ref 80.0–100.0)
Platelets: 150 10*3/uL (ref 150–440)
RBC: 4.35 MIL/uL — AB (ref 4.40–5.90)
RDW: 13 % (ref 11.5–14.5)
WBC: 9.1 10*3/uL (ref 3.8–10.6)

## 2015-12-21 LAB — VITAMIN B12: VITAMIN B 12: 400 pg/mL (ref 180–914)

## 2015-12-21 LAB — SEDIMENTATION RATE: SED RATE: 37 mm/h — AB (ref 0–20)

## 2015-12-21 LAB — FOLATE: FOLATE: 36 ng/mL (ref >5.9)

## 2015-12-21 LAB — FERRITIN: Ferritin: 55 ng/mL (ref 24–336)

## 2015-12-21 LAB — T4, FREE: FREE T4: 0.98 ng/dL (ref 0.61–1.12)

## 2015-12-21 LAB — TSH: TSH: 4.421 u[IU]/mL (ref 0.350–4.500)

## 2015-12-22 LAB — ANA W/REFLEX IF POSITIVE: ANA: NEGATIVE

## 2015-12-22 LAB — RHEUMATOID FACTOR: Rhuematoid fact SerPl-aCnc: <10 IU/mL (ref 0.0–13.9)

## 2016-01-03 ENCOUNTER — Encounter (HOSPITAL_COMMUNITY): Payer: Medicare Other

## 2016-01-03 ENCOUNTER — Ambulatory Visit: Payer: Medicare Other | Admitting: Surgery

## 2016-02-01 DIAGNOSIS — G4730 Sleep apnea, unspecified: Secondary | ICD-10-CM | POA: Diagnosis not present

## 2016-02-01 DIAGNOSIS — I5042 Chronic combined systolic (congestive) and diastolic (congestive) heart failure: Secondary | ICD-10-CM | POA: Diagnosis not present

## 2016-02-01 DIAGNOSIS — G4700 Insomnia, unspecified: Secondary | ICD-10-CM | POA: Diagnosis not present

## 2016-02-01 DIAGNOSIS — J449 Chronic obstructive pulmonary disease, unspecified: Secondary | ICD-10-CM | POA: Diagnosis not present

## 2016-02-02 DIAGNOSIS — I739 Peripheral vascular disease, unspecified: Secondary | ICD-10-CM | POA: Diagnosis not present

## 2016-02-09 DIAGNOSIS — I25810 Atherosclerosis of coronary artery bypass graft(s) without angina pectoris: Secondary | ICD-10-CM | POA: Diagnosis not present

## 2016-02-09 DIAGNOSIS — R601 Generalized edema: Secondary | ICD-10-CM | POA: Diagnosis not present

## 2016-02-09 DIAGNOSIS — I10 Essential (primary) hypertension: Secondary | ICD-10-CM | POA: Diagnosis not present

## 2016-02-09 DIAGNOSIS — R600 Localized edema: Secondary | ICD-10-CM | POA: Diagnosis not present

## 2016-02-09 DIAGNOSIS — I5032 Chronic diastolic (congestive) heart failure: Secondary | ICD-10-CM | POA: Diagnosis not present

## 2016-02-22 DIAGNOSIS — D619 Aplastic anemia, unspecified: Secondary | ICD-10-CM | POA: Diagnosis not present

## 2016-02-22 DIAGNOSIS — M25579 Pain in unspecified ankle and joints of unspecified foot: Secondary | ICD-10-CM | POA: Diagnosis not present

## 2016-02-22 DIAGNOSIS — E1165 Type 2 diabetes mellitus with hyperglycemia: Secondary | ICD-10-CM | POA: Diagnosis not present

## 2016-02-22 DIAGNOSIS — M792 Neuralgia and neuritis, unspecified: Secondary | ICD-10-CM | POA: Diagnosis not present

## 2016-02-22 DIAGNOSIS — I10 Essential (primary) hypertension: Secondary | ICD-10-CM | POA: Diagnosis not present

## 2016-02-22 DIAGNOSIS — I739 Peripheral vascular disease, unspecified: Secondary | ICD-10-CM | POA: Diagnosis not present

## 2016-02-25 DIAGNOSIS — I2510 Atherosclerotic heart disease of native coronary artery without angina pectoris: Secondary | ICD-10-CM | POA: Diagnosis not present

## 2016-02-25 DIAGNOSIS — M79609 Pain in unspecified limb: Secondary | ICD-10-CM | POA: Diagnosis not present

## 2016-02-25 DIAGNOSIS — I70223 Atherosclerosis of native arteries of extremities with rest pain, bilateral legs: Secondary | ICD-10-CM | POA: Diagnosis not present

## 2016-02-25 DIAGNOSIS — E785 Hyperlipidemia, unspecified: Secondary | ICD-10-CM | POA: Diagnosis not present

## 2016-02-25 DIAGNOSIS — E119 Type 2 diabetes mellitus without complications: Secondary | ICD-10-CM | POA: Diagnosis not present

## 2016-02-25 DIAGNOSIS — M7989 Other specified soft tissue disorders: Secondary | ICD-10-CM | POA: Diagnosis not present

## 2016-02-25 DIAGNOSIS — E669 Obesity, unspecified: Secondary | ICD-10-CM | POA: Diagnosis not present

## 2016-02-25 DIAGNOSIS — L97209 Non-pressure chronic ulcer of unspecified calf with unspecified severity: Secondary | ICD-10-CM | POA: Diagnosis not present

## 2016-02-25 DIAGNOSIS — I10 Essential (primary) hypertension: Secondary | ICD-10-CM | POA: Diagnosis not present

## 2016-02-25 DIAGNOSIS — I83209 Varicose veins of unspecified lower extremity with both ulcer of unspecified site and inflammation: Secondary | ICD-10-CM | POA: Diagnosis not present

## 2016-02-25 DIAGNOSIS — I890 Lymphedema, not elsewhere classified: Secondary | ICD-10-CM | POA: Diagnosis not present

## 2016-02-28 ENCOUNTER — Ambulatory Visit: Admission: RE | Admit: 2016-02-28 | Payer: Medicare Other | Source: Ambulatory Visit | Admitting: Vascular Surgery

## 2016-02-28 ENCOUNTER — Encounter: Admission: RE | Payer: Self-pay | Source: Ambulatory Visit

## 2016-02-28 ENCOUNTER — Other Ambulatory Visit: Payer: Self-pay | Admitting: Vascular Surgery

## 2016-02-28 SURGERY — LOWER EXTREMITY ANGIOGRAPHY
Anesthesia: Moderate Sedation | Site: Leg Lower | Laterality: Right

## 2016-03-06 ENCOUNTER — Ambulatory Visit: Admit: 2016-03-06 | Payer: Medicare Other | Admitting: Vascular Surgery

## 2016-03-06 SURGERY — LOWER EXTREMITY ANGIOGRAPHY
Anesthesia: Moderate Sedation | Site: Leg Lower | Laterality: Left

## 2016-03-20 DIAGNOSIS — M792 Neuralgia and neuritis, unspecified: Secondary | ICD-10-CM | POA: Diagnosis not present

## 2016-03-20 DIAGNOSIS — E1165 Type 2 diabetes mellitus with hyperglycemia: Secondary | ICD-10-CM | POA: Diagnosis not present

## 2016-03-20 DIAGNOSIS — I10 Essential (primary) hypertension: Secondary | ICD-10-CM | POA: Diagnosis not present

## 2016-03-20 DIAGNOSIS — J449 Chronic obstructive pulmonary disease, unspecified: Secondary | ICD-10-CM | POA: Diagnosis not present

## 2016-03-20 DIAGNOSIS — Z0001 Encounter for general adult medical examination with abnormal findings: Secondary | ICD-10-CM | POA: Diagnosis not present

## 2016-03-20 DIAGNOSIS — M79669 Pain in unspecified lower leg: Secondary | ICD-10-CM | POA: Diagnosis not present

## 2016-03-20 DIAGNOSIS — I739 Peripheral vascular disease, unspecified: Secondary | ICD-10-CM | POA: Diagnosis not present

## 2016-04-12 DIAGNOSIS — J449 Chronic obstructive pulmonary disease, unspecified: Secondary | ICD-10-CM | POA: Diagnosis not present

## 2016-04-12 DIAGNOSIS — I739 Peripheral vascular disease, unspecified: Secondary | ICD-10-CM | POA: Diagnosis not present

## 2016-04-12 DIAGNOSIS — M792 Neuralgia and neuritis, unspecified: Secondary | ICD-10-CM | POA: Diagnosis not present

## 2016-04-12 DIAGNOSIS — M79669 Pain in unspecified lower leg: Secondary | ICD-10-CM | POA: Diagnosis not present

## 2016-04-12 DIAGNOSIS — E1165 Type 2 diabetes mellitus with hyperglycemia: Secondary | ICD-10-CM | POA: Diagnosis not present

## 2016-04-12 DIAGNOSIS — G894 Chronic pain syndrome: Secondary | ICD-10-CM | POA: Diagnosis not present

## 2016-05-23 DIAGNOSIS — G4730 Sleep apnea, unspecified: Secondary | ICD-10-CM | POA: Diagnosis not present

## 2016-05-23 DIAGNOSIS — J449 Chronic obstructive pulmonary disease, unspecified: Secondary | ICD-10-CM | POA: Diagnosis not present

## 2016-05-23 DIAGNOSIS — I5042 Chronic combined systolic (congestive) and diastolic (congestive) heart failure: Secondary | ICD-10-CM | POA: Diagnosis not present

## 2016-05-23 DIAGNOSIS — G4700 Insomnia, unspecified: Secondary | ICD-10-CM | POA: Diagnosis not present

## 2016-05-25 DIAGNOSIS — E1165 Type 2 diabetes mellitus with hyperglycemia: Secondary | ICD-10-CM | POA: Diagnosis not present

## 2016-05-25 DIAGNOSIS — G4714 Hypersomnia due to medical condition: Secondary | ICD-10-CM | POA: Diagnosis not present

## 2016-05-25 DIAGNOSIS — N182 Chronic kidney disease, stage 2 (mild): Secondary | ICD-10-CM | POA: Diagnosis not present

## 2016-05-25 DIAGNOSIS — M79661 Pain in right lower leg: Secondary | ICD-10-CM | POA: Diagnosis not present

## 2016-05-25 DIAGNOSIS — M792 Neuralgia and neuritis, unspecified: Secondary | ICD-10-CM | POA: Diagnosis not present

## 2016-06-11 ENCOUNTER — Emergency Department: Payer: Medicare Other

## 2016-06-11 ENCOUNTER — Emergency Department
Admission: EM | Admit: 2016-06-11 | Discharge: 2016-06-11 | Disposition: A | Discharge status: Home / Self Care | Payer: Medicare Other | Attending: Emergency Medicine | Admitting: Emergency Medicine

## 2016-06-11 DIAGNOSIS — I10 Essential (primary) hypertension: Secondary | ICD-10-CM | POA: Diagnosis not present

## 2016-06-11 DIAGNOSIS — Z951 Presence of aortocoronary bypass graft: Secondary | ICD-10-CM | POA: Insufficient documentation

## 2016-06-11 DIAGNOSIS — Z794 Long term (current) use of insulin: Secondary | ICD-10-CM | POA: Diagnosis not present

## 2016-06-11 DIAGNOSIS — Z79899 Other long term (current) drug therapy: Secondary | ICD-10-CM | POA: Diagnosis not present

## 2016-06-11 DIAGNOSIS — Z7982 Long term (current) use of aspirin: Secondary | ICD-10-CM | POA: Insufficient documentation

## 2016-06-11 DIAGNOSIS — J069 Acute upper respiratory infection, unspecified: Secondary | ICD-10-CM | POA: Diagnosis not present

## 2016-06-11 DIAGNOSIS — E1165 Type 2 diabetes mellitus with hyperglycemia: Secondary | ICD-10-CM | POA: Diagnosis not present

## 2016-06-11 DIAGNOSIS — J449 Chronic obstructive pulmonary disease, unspecified: Secondary | ICD-10-CM | POA: Insufficient documentation

## 2016-06-11 DIAGNOSIS — I2510 Atherosclerotic heart disease of native coronary artery without angina pectoris: Secondary | ICD-10-CM | POA: Insufficient documentation

## 2016-06-11 DIAGNOSIS — R05 Cough: Secondary | ICD-10-CM | POA: Diagnosis present

## 2016-06-11 DIAGNOSIS — R739 Hyperglycemia, unspecified: Secondary | ICD-10-CM

## 2016-06-11 LAB — BASIC METABOLIC PANEL
Anion gap: 8 (ref 5–15)
BUN: 25 mg/dL — ABNORMAL HIGH (ref 6–20)
CO2: 26 mmol/L (ref 22–32)
Calcium: 9.3 mg/dL (ref 8.9–10.3)
Chloride: 101 mmol/L (ref 101–111)
Creatinine, Ser: 1.44 mg/dL — ABNORMAL HIGH (ref 0.61–1.24)
GFR, EST AFRICAN AMERICAN: 55 mL/min — AB (ref >60)
GFR, EST NON AFRICAN AMERICAN: 47 mL/min — AB (ref >60)
GLUCOSE: 354 mg/dL — AB (ref 65–99)
POTASSIUM: 3.6 mmol/L (ref 3.5–5.1)
Sodium: 135 mmol/L (ref 135–145)

## 2016-06-11 LAB — CBC
HEMATOCRIT: 41.6 % (ref 40.0–52.0)
HEMOGLOBIN: 14.3 g/dL (ref 13.0–18.0)
MCH: 32.4 pg (ref 26.0–34.0)
MCHC: 34.4 g/dL (ref 32.0–36.0)
MCV: 94.2 fL (ref 80.0–100.0)
Platelets: 145 10*3/uL — ABNORMAL LOW (ref 150–440)
RBC: 4.41 MIL/uL (ref 4.40–5.90)
RDW: 12.6 % (ref 11.5–14.5)
WBC: 8.3 10*3/uL (ref 3.8–10.6)

## 2016-06-11 LAB — URINALYSIS, COMPLETE (UACMP) WITH MICROSCOPIC
BACTERIA UA: NONE SEEN
BILIRUBIN URINE: NEGATIVE
Glucose, UA: >=500 mg/dL — AB
HGB URINE DIPSTICK: NEGATIVE
KETONES UR: NEGATIVE mg/dL
Leukocytes, UA: NEGATIVE
NITRITE: NEGATIVE
PROTEIN: NEGATIVE mg/dL
RBC / HPF: NONE SEEN RBC/hpf (ref 0–5)
SPECIFIC GRAVITY, URINE: 1.022 (ref 1.005–1.030)
pH: 5 (ref 5.0–8.0)

## 2016-06-11 LAB — RAPID INFLUENZA A&B ANTIGENS (ARMC ONLY)
INFLUENZA A (ARMC): NEGATIVE
INFLUENZA B (ARMC): NEGATIVE

## 2016-06-11 LAB — GLUCOSE, CAPILLARY
GLUCOSE-CAPILLARY: 440 mg/dL — AB (ref 65–99)
Glucose-Capillary: 323 mg/dL — ABNORMAL HIGH (ref 65–99)

## 2016-06-11 MED ORDER — INSULIN ASPART 100 UNIT/ML ~~LOC~~ SOLN
10.0000 [IU] | Freq: Once | SUBCUTANEOUS | Status: AC
  Administered 2016-06-11: 10 [IU] via SUBCUTANEOUS
  Filled 2016-06-11: qty 10

## 2016-06-11 MED ORDER — IPRATROPIUM-ALBUTEROL 0.5-2.5 (3) MG/3ML IN SOLN
3.0000 mL | Freq: Once | RESPIRATORY_TRACT | Status: AC
  Administered 2016-06-11: 3 mL via RESPIRATORY_TRACT
  Filled 2016-06-11: qty 3

## 2016-06-11 NOTE — ED Triage Notes (Signed)
First Nurse:  Arrives c/o hyperglycemia for several days.  Sent to ED for evaluation by PCP.  Patient is AAOx3.  Skin warm and dry.  NAD

## 2016-06-11 NOTE — ED Triage Notes (Signed)
Pt reports hyperglycemia with a reading of 553 this am - at this time cbg is 440 - pt c/o excessive thrist and unable to quench it - reports increased urination - pt has been sick with bronchitis and c/o shortness of breath r/t that

## 2016-06-11 NOTE — ED Provider Notes (Signed)
[facility] Emergency Department Provider Note  ____________________________________________  Time seen: Approximately 12:49 PM  I have reviewed the triage vital signs and the nursing notes.   HISTORY  Chief Complaint Hyperglycemia    HPI Adam Merritt is a 71 y.o. male with a history of obstructive chronic bronchitis, diabetes, CAD, HTN, HL, presenting to the emergency department for hyperglycemia. The patient and his wife described that he has just completed a Z-Pak today for bronchitis. He has had an improving cough, but his blood sugar was in the 500s this morning. The patient reports thirst and urinary frequency but no nausea or vomiting, diarrhea, shortness of breath.   Past Medical History:  Diagnosis Date  . Anemia   . CAD (coronary artery disease)   . Chest pain   . Coronary artery disease   . Diabetes mellitus without complication ([place])   . Esophageal reflux   . Herpes zoster without mention of complication   . Hyperlipidemia   . Hypertension   . Insomnia   . Lumbago   . Neuropathy in diabetes ([place])   . Obstructive chronic bronchitis without exacerbation ([place])   . Other malaise and fatigue   . Stroke ([place])   . Varicose veins     Patient Active Problem List   Diagnosis Date Noted  . CVA (cerebral infarction) 02/12/2015  . Peripheral vascular disease, unspecified 09/29/2013    Past Surgical History:  Procedure Laterality Date  . CHOLECYSTECTOMY    . CORONARY ARTERY BYPASS GRAFT      Current Outpatient Rx  . Order #: [id number] Class: Historical Med  . Order #: [id number] Class: Historical Med  . Order #: [id number] Class: Historical Med  . Order #: [id number] Class: Historical Med  . Order #: [id number] Class: Historical Med  . Order #: [id number] Class: Historical Med  . Order #: [id number] Class: Historical Med  . Order #: [id number] Class: Historical Med  . Order #: [id number] Class: Historical Med  . Order #: [id number] Class: Historical  Med  . Order #: [id number] Class: Historical Med  . Order #: [id number] Class: Historical Med  . Order #: [id number] Class: Historical Med  . Order #: [id number] Class: Print  . Order #: [id number] Class: Historical Med    Allergies Patient has no known allergies.  Family History  Problem Relation Age of Onset  . Diabetes Mother   . Hyperlipidemia Mother   . Hypertension Mother   . Diabetes Father   . Hyperlipidemia Father   . Hypertension Father     Social History Social History  Substance Use Topics  . Smoking status: Never Smoker  . Smokeless tobacco: Never Used  . Alcohol use No    Review of Systems Constitutional: No fever/chills.No lightheadedness or syncope. Eyes: No visual changes. No eye discharge. ENT: No sore throat. Positive mild congestion and rhinorrhea. Cardiovascular: Denies chest pain. Denies palpitations. Respiratory: Denies shortness of breath.  Positive improving cough. Gastrointestinal: No abdominal pain.  No nausea, no vomiting.  No diarrhea.  No constipation. Genitourinary: Negative for dysuria. Musculoskeletal: Negative for back pain. Skin: Negative for rash. Neurological: Negative for headaches. No focal numbness, tingling or weakness.   10-point ROS otherwise negative.  ____________________________________________   PHYSICAL EXAM:  VITAL SIGNS: ED Triage Vitals  Enc Vitals Group     BP 06/11/16 1223 (!) 175/89     Pulse Rate 06/11/16 1223 75     Resp 06/11/16 1223 18     Temp 06/11/16 1223 97.8 F (36.6 C)     Temp Source 06/11/16 1223 Oral  SpO2 06/11/16 1223 96 %     Weight 06/11/16 1224 (!) 320 lb (145.2 kg)     Height 06/11/16 1224 5\' 5"  (1.651 m)     Head Circumference --      Peak Flow --      Pain Score 06/11/16 1227 0     Pain Loc --      Pain Edu? --      Excl. in [place]? --     Constitutional: Alert and oriented. Chronically ill appearing but in no acute distress. Answers questions appropriately. Eyes: Patient wears a eye  patch over the right eye. The left eye has normal extraocular movements Head: Atraumatic. Nose: No congestion/rhinnorhea. Mouth/Throat: Mucous membranes are moist.  Neck: No stridor.  Supple.  No meningismus. Cardiovascular: Normal rate, regular rhythm. No murmurs, rubs or gallops.  Respiratory: Normal respiratory effort.  No accessory muscle use or retractions. Lungs CTAB.  Mild end expiratory wheezing bilaterally.  Gastrointestinal: Obese. Soft, nontender and nondistended.  No guarding or rebound.  No peritoneal signs. Musculoskeletal: Symmetric pitting lower extremity edema to the mid thighs. No ttp in the calves or palpable cords.  Negative Homan's sign. Neurologic:  A&Ox3.  Speech is clear.  Face and smile are symmetric.  EOMI.  Moves all extremities well. Skin:  Skin is warm, dry and intact. No rash noted. Psychiatric: Mood and affect are normal. Speech and behavior are normal.  Normal judgement.  ____________________________________________   LABS (all labs ordered are listed, but only abnormal results are displayed)  Labs Reviewed  GLUCOSE, CAPILLARY - Abnormal; Notable for the following:       Result Value   Glucose-Capillary 440 (*)    All other components within normal limits  BASIC METABOLIC PANEL - Abnormal; Notable for the following:    Glucose, Bld 354 (*)    BUN 25 (*)    Creatinine, Ser 1.44 (*)    GFR calc non Af Amer 47 (*)    GFR calc Af Amer 55 (*)    All other components within normal limits  CBC - Abnormal; Notable for the following:    Platelets 145 (*)    All other components within normal limits  URINALYSIS, COMPLETE (UACMP) WITH MICROSCOPIC - Abnormal; Notable for the following:    Color, Urine STRAW (*)    APPearance CLEAR (*)    Glucose, UA >=500 (*)    Squamous Epithelial / LPF 0-5 (*)    All other components within normal limits  BLOOD GAS, VENOUS - Abnormal; Notable for the following:    Bicarbonate 29.1 (*)    Acid-Base Excess 3.1 (*)    All  other components within normal limits  RAPID INFLUENZA A&B ANTIGENS ([facility] ONLY)  CBG MONITORING, ED   ____________________________________________  EKG  ED ECG REPORT I, Eula Listen, the attending physician, personally viewed and interpreted this ECG.   Date: 06/11/2016  EKG Time: 1304  Rate: 74  Rhythm: normal sinus rhythm  Axis: normal  Intervals:none  ST&T Change: No STEMI  ____________________________________________  RADIOLOGY  Dg Chest 2 View  Result Date: 06/11/2016 CLINICAL DATA:  Hyperglycemia. EXAM: CHEST  2 VIEW COMPARISON:  Single-view of the chest 02/12/2015. PA and lateral chest 01/05/2014. FINDINGS: The patient is status post CABG with broken median sternotomy wires unchanged. Lungs are clear. Heart size is normal. No pneumothorax or pleural effusion. No acute bony abnormality. IMPRESSION: No acute disease. Electronically Signed   By: Inge Rise M.D.   On: 06/11/2016 13:08  ____________________________________________   PROCEDURES  Procedure(s) performed: None  Procedures  Critical Care performed: No ____________________________________________   INITIAL IMPRESSION / ASSESSMENT AND PLAN / ED COURSE  Pertinent labs & imaging results that were available during my care of the patient were reviewed by me and considered in my medical decision making (see chart for details).  71 y.o. male with a history of chronic bronchitis, diabetes, and CAD presenting with hyperglycemia in the setting of a recent bronchitis flare. The patient's pulmonary symptoms appear to be improving, per his history. We will evaluate him for DKA. Also get a screening EKG. We'll get a chest x-ray to rule out pneumonia. Overall, the patient's vital signs are reassuring, and his hyperglycemia may be from his recent illness. He has not been on steroids which would be a potential culprit for hyperglycemia.  ----------------------------------------- 1:40 PM on  06/11/2016 -----------------------------------------  The patient has hyperglycemia without DKA. He does not have any evidence of pneumonia on his chest x-ray. The remainder of his electrolytes are reassuring, and he does not have UTI. He does have some glucose in his urine, which is expected given his hyperglycemia. At this time, the patient continues to be hemodynamic stable and after we lower his blood sugar, he'll be discharged home. I had a long discussion with the patient and his wife about sugar management, and recommended that they continue their prescribed long-acting insulin and sliding scale in the mornings. They will call her primary care physician Mar[facility] morning for further management. Return precautions were discussed.  ____________________________________________  FINAL CLINICAL IMPRESSION(S) / ED DIAGNOSES  Final diagnoses:  Hyperglycemia  Upper respiratory tract infection, unspecified type    Clinical Course       NEW MEDICATIONS STARTED DURING THIS VISIT:  New Prescriptions   No medications on file      Eula Listen, MD 06/11/16 1344

## 2016-06-11 NOTE — Discharge Instructions (Signed)
Please complete your last day of azithromycin. Please continue to take your insulin, including the sliding scale, as prescribed.  Return to the emergency department for fever, vomiting, severe pain, altered mental status, or any other symptoms concerning to you.

## 2016-06-12 LAB — BLOOD GAS, VENOUS
Acid-Base Excess: 3.1 mmol/L — ABNORMAL HIGH (ref 0.0–2.0)
Bicarbonate: 29.1 mmol/L — ABNORMAL HIGH (ref 20.0–28.0)
O2 SAT: 78.1 %
PO2 VEN: 43 mmHg (ref 32.0–45.0)
Patient temperature: 37
pCO2, Ven: 48 mmHg (ref 44.0–60.0)
pH, Ven: 7.39 (ref 7.250–7.430)

## 2016-06-15 DIAGNOSIS — J449 Chronic obstructive pulmonary disease, unspecified: Secondary | ICD-10-CM | POA: Diagnosis not present

## 2016-07-03 ENCOUNTER — Other Ambulatory Visit
Admission: RE | Admit: 2016-07-03 | Discharge: 2016-07-03 | Disposition: A | Discharge status: Home / Self Care | Payer: Medicare Other | Source: Ambulatory Visit | Attending: Internal Medicine | Admitting: Internal Medicine

## 2016-07-03 DIAGNOSIS — G4714 Hypersomnia due to medical condition: Secondary | ICD-10-CM | POA: Insufficient documentation

## 2016-07-03 DIAGNOSIS — E1122 Type 2 diabetes mellitus with diabetic chronic kidney disease: Secondary | ICD-10-CM | POA: Insufficient documentation

## 2016-07-03 DIAGNOSIS — M79661 Pain in right lower leg: Secondary | ICD-10-CM | POA: Diagnosis present

## 2016-07-03 LAB — COMPREHENSIVE METABOLIC PANEL
ALK PHOS: 80 U/L (ref 38–126)
ALT: 18 U/L (ref 17–63)
AST: 32 U/L (ref 15–41)
Albumin: 3.8 g/dL (ref 3.5–5.0)
Anion gap: 11 (ref 5–15)
BILIRUBIN TOTAL: 0.5 mg/dL (ref 0.3–1.2)
BUN: 43 mg/dL — ABNORMAL HIGH (ref 6–20)
CALCIUM: 8.8 mg/dL — AB (ref 8.9–10.3)
CO2: 27 mmol/L (ref 22–32)
CREATININE: 2.31 mg/dL — AB (ref 0.61–1.24)
Chloride: 96 mmol/L — ABNORMAL LOW (ref 101–111)
GFR, EST AFRICAN AMERICAN: 31 mL/min — AB (ref >60)
GFR, EST NON AFRICAN AMERICAN: 27 mL/min — AB (ref >60)
Glucose, Bld: 292 mg/dL — ABNORMAL HIGH (ref 65–99)
Potassium: 2.9 mmol/L — ABNORMAL LOW (ref 3.5–5.1)
Sodium: 134 mmol/L — ABNORMAL LOW (ref 135–145)
Total Protein: 7.2 g/dL (ref 6.5–8.1)

## 2016-07-03 LAB — LIPID PANEL
CHOLESTEROL: 107 mg/dL (ref 0–200)
HDL: 17 mg/dL — ABNORMAL LOW (ref >40)
LDL Cholesterol: 28 mg/dL (ref 0–99)
Total CHOL/HDL Ratio: 6.3 RATIO
Triglycerides: 311 mg/dL — ABNORMAL HIGH (ref <150)
VLDL: 62 mg/dL — AB (ref 0–40)

## 2016-07-03 LAB — SEDIMENTATION RATE: Sed Rate: 44 mm/hr — ABNORMAL HIGH (ref 0–20)

## 2016-07-03 LAB — CBC
HCT: 40.6 % (ref 40.0–52.0)
HEMOGLOBIN: 14.2 g/dL (ref 13.0–18.0)
MCH: 32.6 pg (ref 26.0–34.0)
MCHC: 34.9 g/dL (ref 32.0–36.0)
MCV: 93.3 fL (ref 80.0–100.0)
Platelets: 151 10*3/uL (ref 150–440)
RBC: 4.35 MIL/uL — AB (ref 4.40–5.90)
RDW: 12.6 % (ref 11.5–14.5)
WBC: 9.9 10*3/uL (ref 3.8–10.6)

## 2016-07-03 LAB — TSH: TSH: 2.326 u[IU]/mL (ref 0.350–4.500)

## 2016-07-03 LAB — VITAMIN B12: VITAMIN B 12: 453 pg/mL (ref 180–914)

## 2016-07-03 LAB — T4, FREE: Free T4: 0.93 ng/dL (ref 0.61–1.12)

## 2016-07-03 LAB — PSA: PSA: 0.17 ng/mL (ref 0.00–4.00)

## 2016-08-30 ENCOUNTER — Emergency Department: Payer: Medicare Other

## 2016-08-30 ENCOUNTER — Encounter: Payer: Self-pay | Admitting: Emergency Medicine

## 2016-08-30 ENCOUNTER — Emergency Department
Admission: EM | Admit: 2016-08-30 | Discharge: 2016-08-30 | Disposition: A | Discharge status: Home / Self Care | Payer: Medicare Other | Attending: Student in an Organized Health Care Education/Training Program | Admitting: Student in an Organized Health Care Education/Training Program

## 2016-08-30 DIAGNOSIS — I2510 Atherosclerotic heart disease of native coronary artery without angina pectoris: Secondary | ICD-10-CM | POA: Insufficient documentation

## 2016-08-30 DIAGNOSIS — I10 Essential (primary) hypertension: Secondary | ICD-10-CM | POA: Insufficient documentation

## 2016-08-30 DIAGNOSIS — I872 Venous insufficiency (chronic) (peripheral): Secondary | ICD-10-CM | POA: Insufficient documentation

## 2016-08-30 DIAGNOSIS — Z7982 Long term (current) use of aspirin: Secondary | ICD-10-CM | POA: Insufficient documentation

## 2016-08-30 DIAGNOSIS — Z7984 Long term (current) use of oral hypoglycemic drugs: Secondary | ICD-10-CM | POA: Diagnosis not present

## 2016-08-30 DIAGNOSIS — Z79899 Other long term (current) drug therapy: Secondary | ICD-10-CM | POA: Insufficient documentation

## 2016-08-30 DIAGNOSIS — E1165 Type 2 diabetes mellitus with hyperglycemia: Secondary | ICD-10-CM | POA: Diagnosis not present

## 2016-08-30 DIAGNOSIS — R739 Hyperglycemia, unspecified: Secondary | ICD-10-CM

## 2016-08-30 LAB — URINALYSIS, COMPLETE (UACMP) WITH MICROSCOPIC
Bacteria, UA: NONE SEEN
Bilirubin Urine: NEGATIVE
GLUCOSE, UA: NEGATIVE mg/dL
HGB URINE DIPSTICK: NEGATIVE
Ketones, ur: NEGATIVE mg/dL
LEUKOCYTES UA: NEGATIVE
NITRITE: NEGATIVE
Protein, ur: NEGATIVE mg/dL
SPECIFIC GRAVITY, URINE: 1.014 (ref 1.005–1.030)
Squamous Epithelial / LPF: NONE SEEN
pH: 5 (ref 5.0–8.0)

## 2016-08-30 LAB — BASIC METABOLIC PANEL
Anion gap: 11 (ref 5–15)
BUN: 29 mg/dL — AB (ref 6–20)
CO2: 25 mmol/L (ref 22–32)
Calcium: 9 mg/dL (ref 8.9–10.3)
Chloride: 101 mmol/L (ref 101–111)
Creatinine, Ser: 2 mg/dL — ABNORMAL HIGH (ref 0.61–1.24)
GFR calc non Af Amer: 32 mL/min — ABNORMAL LOW (ref >60)
GFR, EST AFRICAN AMERICAN: 37 mL/min — AB (ref >60)
Glucose, Bld: 253 mg/dL — ABNORMAL HIGH (ref 65–99)
POTASSIUM: 3.1 mmol/L — AB (ref 3.5–5.1)
SODIUM: 137 mmol/L (ref 135–145)

## 2016-08-30 LAB — CBC
HEMATOCRIT: 38.5 % — AB (ref 40.0–52.0)
Hemoglobin: 13.7 g/dL (ref 13.0–18.0)
MCH: 33.2 pg (ref 26.0–34.0)
MCHC: 35.7 g/dL (ref 32.0–36.0)
MCV: 93.1 fL (ref 80.0–100.0)
PLATELETS: 169 10*3/uL (ref 150–440)
RBC: 4.13 MIL/uL — ABNORMAL LOW (ref 4.40–5.90)
RDW: 13.5 % (ref 11.5–14.5)
WBC: 9.5 10*3/uL (ref 3.8–10.6)

## 2016-08-30 LAB — GLUCOSE, CAPILLARY: Glucose-Capillary: 254 mg/dL — ABNORMAL HIGH (ref 65–99)

## 2016-08-30 NOTE — ED Triage Notes (Signed)
Pt comes into the ED via POV c/o hyperglycemia and cellulitis in bilateral lower legs.  Patient in NAD at this time with even and unlabored respirations.  Patient currently taking an antibiotic (doxycyline) and placed on a cream (hydrocortisone) for his cellulitis.  Patient has even and unlabored respirations.

## 2016-08-30 NOTE — ED Notes (Signed)
CBG 247 

## 2016-08-30 NOTE — ED Notes (Signed)
Pt discharged to home.  Family member driving.  Discharge instructions reviewed.  Verbalized understanding.  No questions or concerns at this time.  Teach back verified.  Pt in NAD.  No items left in ED.   

## 2016-08-30 NOTE — ED Provider Notes (Signed)
[facility] Emergency Department Provider Note    First MD Initiated Contact with Patient 08/30/16 2158     (approximate)  I have reviewed the triage vital signs and the nursing notes.   HISTORY  Chief Complaint Cellulitis and Hyperglycemia    HPI Adam Merritt is a 71 y.o. male presents with wife due to concern for generalized malaise that started this morning. Patient states he just "felt bad". States his blood sugars been elevated. He is currently on antibiotics, doxycycline as well as steroid cream for cellulitis and what appears to be chronic venous stasis ulcers bilateral lower extremities. Has not had any fevers. He denies any shortness of breath or chest pain. No orthopnea. Has a home wound care nurse that comes to check on him daily. She checked his blood pressure this morning was elevated. She called the patient's primary care doctor who told the patient come to the ER.  Her out of the ER he denies any complaints at this time. States is been taking his medication as directed. Denies any numbness or tingling.   Past Medical History:  Diagnosis Date  . Anemia   . CAD (coronary artery disease)   . Chest pain   . Coronary artery disease   . Diabetes mellitus without complication ([place])   . Esophageal reflux   . Herpes zoster without mention of complication   . Hyperlipidemia   . Hypertension   . Insomnia   . Lumbago   . Neuropathy in diabetes ([place])   . Obstructive chronic bronchitis without exacerbation ([place])   . Other malaise and fatigue   . Stroke ([place])   . Varicose veins    Family History  Problem Relation Age of Onset  . Diabetes Mother   . Hyperlipidemia Mother   . Hypertension Mother   . Diabetes Father   . Hyperlipidemia Father   . Hypertension Father    Past Surgical History:  Procedure Laterality Date  . CHOLECYSTECTOMY    . CORONARY ARTERY BYPASS GRAFT     Patient Active Problem List   Diagnosis Date Noted  . CVA  (cerebral infarction) 02/12/2015  . Peripheral vascular disease, unspecified 09/29/2013      Prior to Admission medications   Medication Sig Start Date End Date Taking? Authorizing Provider  albuterol (PROVENTIL HFA;VENTOLIN HFA) 108 (90 BASE) MCG/ACT inhaler Inhale 1 puff into the lungs every 6 (six) hours as needed for wheezing or shortness of breath.    Historical Provider, MD  aspirin EC 325 MG tablet Take 325 mg by mouth at bedtime.    Historical Provider, MD  atorvastatin (LIPITOR) 40 MG tablet Take 20 mg by mouth at bedtime.     Historical Provider, MD  carvedilol (COREG) 25 MG tablet Take 25 mg by mouth 2 (two) times daily with a meal.    Historical Provider, MD  clobetasol ointment (TEMOVATE) 7.25 % Apply 1 application topically 2 (two) times daily. Pt applies to lower legs.    Historical Provider, MD  gabapentin (NEURONTIN) 100 MG capsule Take 200 mg by mouth 3 (three) times daily.     Historical Provider, MD  hydrALAZINE (APRESOLINE) 25 MG tablet Take 25 mg by mouth 3 (three) times daily.    Historical Provider, MD  ipratropium-albuterol (DUONEB) 0.5-2.5 (3) MG/3ML SOLN Take 3 mLs by nebulization every 4 (four) hours as needed (for shortness of breath).    Historical Provider, MD  Liraglutide (VICTOZA) 18 MG/3ML SOPN Inject 1.8 mg into the skin  daily.     Historical Provider, MD  montelukast (SINGULAIR) 10 MG tablet Take 10 mg by mouth daily as needed (for allergies).     Historical Provider, MD  Multiple Vitamin (MULTIVITAMIN WITH MINERALS) TABS tablet Take 1 tablet by mouth daily.    Historical Provider, MD  omeprazole (PRILOSEC) 20 MG capsule Take 20 mg by mouth daily.    Historical Provider, MD  oxyCODONE-acetaminophen (PERCOCET) 7.5-325 MG per tablet Take 1 tablet by mouth 4 (four) times daily as needed for severe pain.    Historical Provider, MD  predniSONE (DELTASONE) 10 MG tablet Take 1 tablet (10 mg total) by mouth daily with breakfast. 02/13/15   Henreitta Leber, MD    zolpidem (AMBIEN CR) 12.5 MG CR tablet Take 12.5 mg by mouth at bedtime as needed for sleep.    Historical Provider, MD    Allergies Patient has no known allergies.    Social History Social History  Substance Use Topics  . Smoking status: Never Smoker  . Smokeless tobacco: Never Used  . Alcohol use No    Review of Systems Patient denies headaches, rhinorrhea, blurry vision, numbness, shortness of breath, chest pain, edema, cough, abdominal pain, nausea, vomiting, diarrhea, dysuria, fevers, rashes or hallucinations unless otherwise stated above in HPI. ____________________________________________   PHYSICAL EXAM:  VITAL SIGNS: Vitals:   08/30/16 1710 08/30/16 2049  BP: (!) 146/95 (!) 132/92  Pulse: 79 74  Resp: 16 20  Temp: 97.7 F (36.5 C) 98.3 F (36.8 C)    Constitutional: Alert and oriented. Chronically ill appearing and in no acute distress. Eyes: Conjunctivae are normal. PERRL. EOMI. Head: Atraumatic. Nose: No congestion/rhinnorhea. Mouth/Throat: Mucous membranes are moist.  Oropharynx non-erythematous. Neck: No stridor. Painless ROM. No cervical spine tenderness to palpation Hematological/Lymphatic/Immunilogical: No cervical lymphadenopathy. Cardiovascular: Normal rate, regular rhythm. Grossly normal heart sounds.  Good peripheral circulation. Respiratory: Normal respiratory effort.  No retractions. Lungs with bibasilar crackles. Gastrointestinal: Soft and nontender. No distention. No abdominal bruits. No CVA tenderness. Musculoskeletal: No lower extremity tenderness 3+ lymphedema bilaterally with chronic venous stasis ulcers, brisk cap refill bilaterally. No open wounds, no blisters or crepitus.  No joint effusions. Neurologic:  Normal speech and language. No gross focal neurologic deficits are appreciated. No gait instability. Skin:  Skin is warm, dry and intact. No rash noted. Psychiatric: Mood and affect are normal. Speech and behavior are  normal.  ____________________________________________   LABS (all labs ordered are listed, but only abnormal results are displayed)  Results for orders placed or performed during the hospital encounter of 08/30/16 (from the past 24 hour(s))  Urinalysis, Complete w Microscopic     Status: Abnormal   Collection Time: 08/30/16 10:15 AM  Result Value Ref Range   Color, Urine YELLOW (A) YELLOW   APPearance CLEAR (A) CLEAR   Specific Gravity, Urine 1.014 1.005 - 1.030   pH 5.0 5.0 - 8.0   Glucose, UA NEGATIVE NEGATIVE mg/dL   Hgb urine dipstick NEGATIVE NEGATIVE   Bilirubin Urine NEGATIVE NEGATIVE   Ketones, ur NEGATIVE NEGATIVE mg/dL   Protein, ur NEGATIVE NEGATIVE mg/dL   Nitrite NEGATIVE NEGATIVE   Leukocytes, UA NEGATIVE NEGATIVE   RBC / HPF 0-5 0 - 5 RBC/hpf   WBC, UA 0-5 0 - 5 WBC/hpf   Bacteria, UA NONE SEEN NONE SEEN   Squamous Epithelial / LPF NONE SEEN NONE SEEN   Mucous PRESENT    Hyaline Casts, UA PRESENT   Basic metabolic panel     Status:  Abnormal   Collection Time: 08/30/16  5:06 PM  Result Value Ref Range   Sodium 137 135 - 145 mmol/L   Potassium 3.1 (L) 3.5 - 5.1 mmol/L   Chloride 101 101 - 111 mmol/L   CO2 25 22 - 32 mmol/L   Glucose, Bld 253 (H) 65 - 99 mg/dL   BUN 29 (H) 6 - 20 mg/dL   Creatinine, Ser 2.00 (H) 0.61 - 1.24 mg/dL   Calcium 9.0 8.9 - 10.3 mg/dL   GFR calc non Af Amer 32 (L) >60 mL/min   GFR calc Af Amer 37 (L) >60 mL/min   Anion gap 11 5 - 15  CBC     Status: Abnormal   Collection Time: 08/30/16  5:06 PM  Result Value Ref Range   WBC 9.5 3.8 - 10.6 K/uL   RBC 4.13 (L) 4.40 - 5.90 MIL/uL   Hemoglobin 13.7 13.0 - 18.0 g/dL   HCT 38.5 (L) 40.0 - 52.0 %   MCV 93.1 80.0 - 100.0 fL   MCH 33.2 26.0 - 34.0 pg   MCHC 35.7 32.0 - 36.0 g/dL   RDW 13.5 11.5 - 14.5 %   Platelets 169 150 - 440 K/uL  Glucose, capillary     Status: Abnormal   Collection Time: 08/30/16  5:13 PM  Result Value Ref Range   Glucose-Capillary 254 (H) 65 - 99 mg/dL    ____________________________________________  EKG My review and personal interpretation at Time: 23:05   Indication: dizziness  Rate: 75  Rhythm: sinus Axis: normal Other: no acute ischemia, normal intervals ____________________________________________  RADIOLOGY  I personally reviewed all radiographic images ordered to evaluate for the above acute complaints and reviewed radiology reports and findings.  These findings were personally discussed with the patient.  Please see medical record for radiology report.  ____________________________________________   PROCEDURES  Procedure(s) performed:  Procedures    Critical Care performed: no ____________________________________________   INITIAL IMPRESSION / ASSESSMENT AND PLAN / ED COURSE  Pertinent labs & imaging results that were available during my care of the patient were reviewed by me and considered in my medical decision making (see chart for details).  DDX: hypoerglycemia, dehydration, chf, cellulitis  HOOVER GREWE is a 70 y.o. who presents to the ED with 2 complaints as above. Patient arrives afebrile hemodynamic stable. He is currently asymptomatic. Blood work is reassuring and at his baseline. No evidence of systemic infection.  Blood pressure has improved he denies any chest pain or shortness of breath. We'll check urinalysis to evaluate for any evidence of ketones given his hyperglycemia though he does not have any acidosis.  Clinical Course as of Aug 30 2309  Wed Aug 30, 2016  2301 Patient had acute distress. Urinalysis without any evidence of infection. Chest x-ray without any edema or acute abnormality. Patient stable for discharge home with outpatient follow-up.  [PR]    Clinical Course User Index [PR] Merlyn Lot, MD     ____________________________________________   FINAL CLINICAL IMPRESSION(S) / ED DIAGNOSES  Final diagnoses:  Hyperglycemia      NEW MEDICATIONS STARTED DURING THIS  VISIT:  New Prescriptions   No medications on file     Note:  This document was prepared using Dragon voice recognition software and may include unintentional dictation errors.    Merlyn Lot, MD 08/30/16 [phone]

## 2016-08-30 NOTE — ED Notes (Signed)
Patient transported to X-ray 

## 2016-08-30 NOTE — Discharge Instructions (Signed)
Return for worsening symptoms.  Follow up with PCP.

## 2016-09-15 ENCOUNTER — Observation Stay
Admission: EM | Admit: 2016-09-15 | Discharge: 2016-09-16 | Disposition: A | Discharge status: Home / Self Care | Payer: Medicare Other | Attending: Internal Medicine | Admitting: Internal Medicine

## 2016-09-15 ENCOUNTER — Emergency Department: Payer: Medicare Other

## 2016-09-15 ENCOUNTER — Encounter: Payer: Self-pay | Admitting: Emergency Medicine

## 2016-09-15 DIAGNOSIS — E1140 Type 2 diabetes mellitus with diabetic neuropathy, unspecified: Secondary | ICD-10-CM | POA: Insufficient documentation

## 2016-09-15 DIAGNOSIS — R531 Weakness: Secondary | ICD-10-CM

## 2016-09-15 DIAGNOSIS — G4700 Insomnia, unspecified: Secondary | ICD-10-CM | POA: Insufficient documentation

## 2016-09-15 DIAGNOSIS — N189 Chronic kidney disease, unspecified: Secondary | ICD-10-CM | POA: Insufficient documentation

## 2016-09-15 DIAGNOSIS — J449 Chronic obstructive pulmonary disease, unspecified: Secondary | ICD-10-CM | POA: Diagnosis not present

## 2016-09-15 DIAGNOSIS — E1122 Type 2 diabetes mellitus with diabetic chronic kidney disease: Secondary | ICD-10-CM | POA: Insufficient documentation

## 2016-09-15 DIAGNOSIS — E785 Hyperlipidemia, unspecified: Secondary | ICD-10-CM | POA: Diagnosis not present

## 2016-09-15 DIAGNOSIS — R29810 Facial weakness: Secondary | ICD-10-CM | POA: Insufficient documentation

## 2016-09-15 DIAGNOSIS — Z79899 Other long term (current) drug therapy: Secondary | ICD-10-CM | POA: Insufficient documentation

## 2016-09-15 DIAGNOSIS — Z6841 Body Mass Index (BMI) 40.0 and over, adult: Secondary | ICD-10-CM | POA: Insufficient documentation

## 2016-09-15 DIAGNOSIS — R079 Chest pain, unspecified: Secondary | ICD-10-CM | POA: Insufficient documentation

## 2016-09-15 DIAGNOSIS — I129 Hypertensive chronic kidney disease with stage 1 through stage 4 chronic kidney disease, or unspecified chronic kidney disease: Secondary | ICD-10-CM | POA: Diagnosis not present

## 2016-09-15 DIAGNOSIS — L03116 Cellulitis of left lower limb: Secondary | ICD-10-CM | POA: Insufficient documentation

## 2016-09-15 DIAGNOSIS — R200 Anesthesia of skin: Secondary | ICD-10-CM | POA: Insufficient documentation

## 2016-09-15 DIAGNOSIS — I639 Cerebral infarction, unspecified: Secondary | ICD-10-CM | POA: Diagnosis present

## 2016-09-15 DIAGNOSIS — Z7982 Long term (current) use of aspirin: Secondary | ICD-10-CM | POA: Insufficient documentation

## 2016-09-15 DIAGNOSIS — G459 Transient cerebral ischemic attack, unspecified: Principal | ICD-10-CM | POA: Insufficient documentation

## 2016-09-15 DIAGNOSIS — I6522 Occlusion and stenosis of left carotid artery: Secondary | ICD-10-CM | POA: Insufficient documentation

## 2016-09-15 DIAGNOSIS — Z7952 Long term (current) use of systemic steroids: Secondary | ICD-10-CM | POA: Insufficient documentation

## 2016-09-15 DIAGNOSIS — E1151 Type 2 diabetes mellitus with diabetic peripheral angiopathy without gangrene: Secondary | ICD-10-CM | POA: Diagnosis not present

## 2016-09-15 DIAGNOSIS — R4781 Slurred speech: Secondary | ICD-10-CM | POA: Diagnosis not present

## 2016-09-15 DIAGNOSIS — L03115 Cellulitis of right lower limb: Secondary | ICD-10-CM | POA: Insufficient documentation

## 2016-09-15 DIAGNOSIS — Z8673 Personal history of transient ischemic attack (TIA), and cerebral infarction without residual deficits: Secondary | ICD-10-CM | POA: Insufficient documentation

## 2016-09-15 DIAGNOSIS — I2510 Atherosclerotic heart disease of native coronary artery without angina pectoris: Secondary | ICD-10-CM | POA: Insufficient documentation

## 2016-09-15 DIAGNOSIS — Z951 Presence of aortocoronary bypass graft: Secondary | ICD-10-CM | POA: Diagnosis not present

## 2016-09-15 DIAGNOSIS — Z9049 Acquired absence of other specified parts of digestive tract: Secondary | ICD-10-CM | POA: Diagnosis not present

## 2016-09-15 LAB — PROTIME-INR
INR: 1.04
PROTHROMBIN TIME: 13.6 s (ref 11.4–15.2)

## 2016-09-15 LAB — DIFFERENTIAL
Basophils Absolute: 0 10*3/uL (ref 0–0.1)
Basophils Relative: 0 %
EOS PCT: 3 %
Eosinophils Absolute: 0.3 10*3/uL (ref 0–0.7)
LYMPHS PCT: 14 %
Lymphs Abs: 1.3 10*3/uL (ref 1.0–3.6)
MONO ABS: 0.6 10*3/uL (ref 0.2–1.0)
MONOS PCT: 6 %
Neutro Abs: 7.1 10*3/uL — ABNORMAL HIGH (ref 1.4–6.5)
Neutrophils Relative %: 77 %

## 2016-09-15 LAB — APTT: aPTT: 38 seconds — ABNORMAL HIGH (ref 24–36)

## 2016-09-15 LAB — TROPONIN I
Troponin I: <0.03 ng/mL (ref <0.03)
Troponin I: <0.03 ng/mL (ref <0.03)

## 2016-09-15 LAB — COMPREHENSIVE METABOLIC PANEL
ALK PHOS: 77 U/L (ref 38–126)
ALT: 24 U/L (ref 17–63)
AST: 29 U/L (ref 15–41)
Albumin: 3.2 g/dL — ABNORMAL LOW (ref 3.5–5.0)
Anion gap: 8 (ref 5–15)
BUN: 20 mg/dL (ref 6–20)
CHLORIDE: 106 mmol/L (ref 101–111)
CO2: 24 mmol/L (ref 22–32)
CREATININE: 1.42 mg/dL — AB (ref 0.61–1.24)
Calcium: 8.6 mg/dL — ABNORMAL LOW (ref 8.9–10.3)
GFR calc Af Amer: 55 mL/min — ABNORMAL LOW (ref >60)
GFR calc non Af Amer: 48 mL/min — ABNORMAL LOW (ref >60)
Glucose, Bld: 187 mg/dL — ABNORMAL HIGH (ref 65–99)
Potassium: 4.2 mmol/L (ref 3.5–5.1)
Sodium: 138 mmol/L (ref 135–145)
Total Bilirubin: 0.7 mg/dL (ref 0.3–1.2)
Total Protein: 6.4 g/dL — ABNORMAL LOW (ref 6.5–8.1)

## 2016-09-15 LAB — GLUCOSE, CAPILLARY: GLUCOSE-CAPILLARY: 156 mg/dL — AB (ref 65–99)

## 2016-09-15 LAB — CBC
HEMATOCRIT: 38.9 % — AB (ref 40.0–52.0)
Hemoglobin: 13.3 g/dL (ref 13.0–18.0)
MCH: 32.8 pg (ref 26.0–34.0)
MCHC: 34.2 g/dL (ref 32.0–36.0)
MCV: 95.7 fL (ref 80.0–100.0)
PLATELETS: 124 10*3/uL — AB (ref 150–440)
RBC: 4.06 MIL/uL — AB (ref 4.40–5.90)
RDW: 13.7 % (ref 11.5–14.5)
WBC: 9.2 10*3/uL (ref 3.8–10.6)

## 2016-09-15 LAB — TSH: TSH: 1.746 u[IU]/mL (ref 0.350–4.500)

## 2016-09-15 MED ORDER — ASPIRIN 81 MG PO CHEW
324.0000 mg | CHEWABLE_TABLET | Freq: Once | ORAL | Status: AC
  Administered 2016-09-15: 324 mg via ORAL
  Filled 2016-09-15: qty 4

## 2016-09-15 MED ORDER — SODIUM CHLORIDE 0.9 % IV SOLN
INTRAVENOUS | Status: DC
Start: 2016-09-15 — End: 2016-09-16
  Administered 2016-09-15: via INTRAVENOUS

## 2016-09-15 MED ORDER — FENTANYL 25 MCG/HR TD PT72
25.0000 ug | MEDICATED_PATCH | TRANSDERMAL | Status: DC
  Administered 2016-09-16: 25 ug via TRANSDERMAL
  Filled 2016-09-15 (×2): qty 1

## 2016-09-15 MED ORDER — ACETAMINOPHEN 650 MG RE SUPP: 650.0000 mg | RECTAL | Status: DC | PRN

## 2016-09-15 MED ORDER — OXYCODONE HCL 5 MG PO TABS
5.0000 mg | ORAL_TABLET | Freq: Three times a day (TID) | ORAL | Status: DC | PRN
  Administered 2016-09-15: 5 mg via ORAL
  Filled 2016-09-15: qty 1

## 2016-09-15 MED ORDER — ENOXAPARIN SODIUM 40 MG/0.4ML ~~LOC~~ SOLN
40.0000 mg | SUBCUTANEOUS | Status: DC
Start: 2016-09-15 — End: 2016-09-16
  Administered 2016-09-15: 40 mg via SUBCUTANEOUS
  Filled 2016-09-15: qty 0.4

## 2016-09-15 MED ORDER — LABETALOL HCL 5 MG/ML IV SOLN
5.0000 mg | Freq: Once | INTRAVENOUS | Status: AC
  Administered 2016-09-15: 5 mg via INTRAVENOUS
  Filled 2016-09-15: qty 4

## 2016-09-15 MED ORDER — CARVEDILOL 25 MG PO TABS
25.0000 mg | ORAL_TABLET | Freq: Two times a day (BID) | ORAL | Status: DC
  Administered 2016-09-16: 25 mg via ORAL
  Filled 2016-09-15: qty 1

## 2016-09-15 MED ORDER — ACETAMINOPHEN 160 MG/5ML PO SOLN
650.0000 mg | ORAL | Status: DC | PRN
  Filled 2016-09-15: qty 20.3

## 2016-09-15 MED ORDER — STROKE: EARLY STAGES OF RECOVERY BOOK
Freq: Once | Status: AC
  Administered 2016-09-16: 01:00:00

## 2016-09-15 MED ORDER — SENNOSIDES-DOCUSATE SODIUM 8.6-50 MG PO TABS: 1.0000 | ORAL_TABLET | Freq: Every evening | ORAL | Status: DC | PRN

## 2016-09-15 MED ORDER — ALBUTEROL SULFATE (2.5 MG/3ML) 0.083% IN NEBU: 2.5000 mg | INHALATION_SOLUTION | Freq: Four times a day (QID) | RESPIRATORY_TRACT | Status: DC | PRN

## 2016-09-15 MED ORDER — NITROGLYCERIN 0.4 MG SL SUBL: 0.4000 mg | SUBLINGUAL_TABLET | SUBLINGUAL | Status: DC | PRN

## 2016-09-15 MED ORDER — ASPIRIN EC 325 MG PO TBEC: 325.0000 mg | DELAYED_RELEASE_TABLET | Freq: Every day | ORAL | Status: DC

## 2016-09-15 MED ORDER — PREDNISONE 10 MG PO TABS
10.0000 mg | ORAL_TABLET | Freq: Every day | ORAL | Status: DC
  Administered 2016-09-16: 10 mg via ORAL
  Filled 2016-09-15 (×2): qty 1

## 2016-09-15 MED ORDER — IPRATROPIUM-ALBUTEROL 0.5-2.5 (3) MG/3ML IN SOLN: 3.0000 mL | RESPIRATORY_TRACT | Status: DC | PRN

## 2016-09-15 MED ORDER — MONTELUKAST SODIUM 10 MG PO TABS: 10.0000 mg | ORAL_TABLET | Freq: Every day | ORAL | Status: DC | PRN

## 2016-09-15 MED ORDER — MORPHINE SULFATE (PF) 4 MG/ML IV SOLN
1.0000 mg | INTRAVENOUS | Status: DC | PRN
  Administered 2016-09-16: 1 mg via INTRAVENOUS
  Filled 2016-09-15: qty 1

## 2016-09-15 MED ORDER — LORAZEPAM 2 MG/ML IJ SOLN
0.5000 mg | Freq: Once | INTRAMUSCULAR | Status: AC
  Administered 2016-09-15: 0.5 mg via INTRAVENOUS
  Filled 2016-09-15: qty 1

## 2016-09-15 MED ORDER — IOPAMIDOL (ISOVUE-370) INJECTION 76%
125.0000 mL | Freq: Once | INTRAVENOUS | Status: AC | PRN
  Administered 2016-09-15: 125 mL via INTRAVENOUS

## 2016-09-15 MED ORDER — ATORVASTATIN CALCIUM 20 MG PO TABS
20.0000 mg | ORAL_TABLET | Freq: Every day | ORAL | Status: DC
  Administered 2016-09-15: 20 mg via ORAL
  Filled 2016-09-15: qty 1

## 2016-09-15 MED ORDER — INSULIN ASPART 100 UNIT/ML ~~LOC~~ SOLN
0.0000 [IU] | SUBCUTANEOUS | Status: DC
  Administered 2016-09-16 (×2): 4 [IU] via SUBCUTANEOUS
  Administered 2016-09-16: 7 [IU] via SUBCUTANEOUS
  Filled 2016-09-15: qty 4
  Filled 2016-09-15: qty 7
  Filled 2016-09-15: qty 4

## 2016-09-15 MED ORDER — ADULT MULTIVITAMIN W/MINERALS CH
1.0000 | ORAL_TABLET | Freq: Every day | ORAL | Status: DC
  Administered 2016-09-16: 1 via ORAL
  Filled 2016-09-15: qty 1

## 2016-09-15 MED ORDER — GABAPENTIN 100 MG PO CAPS
200.0000 mg | ORAL_CAPSULE | Freq: Three times a day (TID) | ORAL | Status: DC
  Administered 2016-09-15 – 2016-09-16 (×2): 200 mg via ORAL
  Filled 2016-09-15 (×2): qty 2

## 2016-09-15 MED ORDER — ACETAMINOPHEN 325 MG PO TABS: 650.0000 mg | ORAL_TABLET | ORAL | Status: DC | PRN

## 2016-09-15 NOTE — ED Triage Notes (Signed)
Pt reports left sided chest pain and left shoulder pain last night about 1700 after walking in driveway. Pt also reports right sided facial numbness since 1700 yesterday. Pt with left sided facial droop, family unsure if new or old. Pt also with some slurred speech, pt reports this is new since yesterday.

## 2016-09-15 NOTE — ED Provider Notes (Signed)
[facility] Emergency Department Provider Note    First MD Initiated Contact with Patient 09/15/16 1722     (approximate)  I have reviewed the triage vital signs and the nursing notes.   HISTORY  Chief Complaint Numbness and Chest Pain    HPI Adam Merritt is a 71 y.o. male presents with chest pain and right facial droop that occurred last night in the evening. Patient was working in the driveway and started complaining of left-sided shoulder pain that was radiating through to his back and right side of his chest. This lasted roughly 1 hour. States that he started feeling numbness and tingling on the right side of his face. Woke up this morning and had trouble closing his right eye as well as feeling that he is having slurred speech. His home health nurse came to check on him and was concerned for stroke as he has a history of TIA. Denies any active chest pain at this moment.   Past Medical History:  Diagnosis Date  . Anemia   . CAD (coronary artery disease)   . Chest pain   . Coronary artery disease   . Diabetes mellitus without complication ([place])   . Esophageal reflux   . Herpes zoster without mention of complication   . Hyperlipidemia   . Hypertension   . Insomnia   . Lumbago   . Neuropathy in diabetes ([place])   . Obstructive chronic bronchitis without exacerbation ([place])   . Other malaise and fatigue   . Stroke ([place])   . Varicose veins    Family History  Problem Relation Age of Onset  . Diabetes Mother   . Hyperlipidemia Mother   . Hypertension Mother   . Diabetes Father   . Hyperlipidemia Father   . Hypertension Father    Past Surgical History:  Procedure Laterality Date  . CHOLECYSTECTOMY    . CORONARY ARTERY BYPASS GRAFT     Patient Active Problem List   Diagnosis Date Noted  . CVA (cerebral vascular accident) ([place]) 09/15/2016  . CVA (cerebral infarction) 02/12/2015  . Peripheral vascular disease, unspecified 09/29/2013       Prior to Admission medications   Medication Sig Start Date End Date Taking? Authorizing Provider  aspirin EC 325 MG tablet Take 325 mg by mouth at bedtime.   Yes Historical Provider, MD  clobetasol ointment (TEMOVATE) 3.87 % Apply 1 application topically 2 (two) times daily. Pt applies to lower legs.   Yes Historical Provider, MD  fentaNYL (DURAGESIC - DOSED MCG/HR) 25 MCG/HR patch 1 patch every 3 (three) days. 09/14/16  Yes Historical Provider, MD  Liraglutide (VICTOZA) 18 MG/3ML SOPN Inject 1.8 mg into the skin daily.    Yes Historical Provider, MD  Multiple Vitamin (MULTIVITAMIN WITH MINERALS) TABS tablet Take 1 tablet by mouth daily.   Yes Historical Provider, MD  Oxycodone HCl 10 MG TABS Take 0.5-1 tablets by mouth 3 (three) times daily as needed. 08/31/16  Yes Historical Provider, MD  zolpidem (AMBIEN CR) 12.5 MG CR tablet Take 12.5 mg by mouth at bedtime as needed for sleep.   Yes Historical Provider, MD  albuterol (PROVENTIL HFA;VENTOLIN HFA) 108 (90 BASE) MCG/ACT inhaler Inhale 1 puff into the lungs every 6 (six) hours as needed for wheezing or shortness of breath.    Historical Provider, MD  atorvastatin (LIPITOR) 40 MG tablet Take 20 mg by mouth at bedtime.     Historical Provider, MD  carvedilol (COREG) 25 MG tablet Take  25 mg by mouth 2 (two) times daily with a meal.    Historical Provider, MD  gabapentin (NEURONTIN) 100 MG capsule Take 200 mg by mouth 3 (three) times daily.     Historical Provider, MD  ipratropium-albuterol (DUONEB) 0.5-2.5 (3) MG/3ML SOLN Take 3 mLs by nebulization every 4 (four) hours as needed (for shortness of breath).    Historical Provider, MD  montelukast (SINGULAIR) 10 MG tablet Take 10 mg by mouth daily as needed (for allergies).     Historical Provider, MD  predniSONE (DELTASONE) 10 MG tablet Take 1 tablet (10 mg total) by mouth daily with breakfast. 02/13/15   Henreitta Leber, MD    Allergies Patient has no known allergies.    Social  History Social History  Substance Use Topics  . Smoking status: Never Smoker  . Smokeless tobacco: Never Used  . Alcohol use No    Review of Systems Patient denies headaches, rhinorrhea, blurry vision, numbness, shortness of breath, chest pain, edema, cough, abdominal pain, nausea, vomiting, diarrhea, dysuria, fevers, rashes or hallucinations unless otherwise stated above in HPI. ____________________________________________   PHYSICAL EXAM:  VITAL SIGNS: Vitals:   09/15/16 1930 09/15/16 2100  BP: (!) 155/93 (!) 152/74  Pulse: 72 79  Resp: 17 15  Temp:      Constitutional: Alert and oriented. Well appearing and in no acute distress. Eyes: Conjunctivae are normal. PERRL. EOMI. Head: Atraumatic. Nose: No congestion/rhinnorhea. Mouth/Throat: Mucous membranes are moist.  Oropharynx non-erythematous. Neck: No stridor. Painless ROM. No cervical spine tenderness to palpation Hematological/Lymphatic/Immunilogical: No cervical lymphadenopathy. Cardiovascular: Normal rate, regular rhythm. Grossly normal heart sounds.  Good peripheral circulation. Respiratory: Normal respiratory effort.  No retractions. Lungs CTAB. Gastrointestinal: Soft and nontender. No distention. No abdominal bruits. No CVA tenderness. Musculoskeletal: extensive BLE edema and venous stasis ulcerations Neurologic:  Right facial droop sparing the forehead, RUE weakness, + right arm drift. Skin:  Skin is warm, dry and intact. No rash noted. Psychiatric: Mood and affect are normal. Speech and behavior are normal.  ____________________________________________   LABS (all labs ordered are listed, but only abnormal results are displayed)  Results for orders placed or performed during the hospital encounter of 09/15/16 (from the past 24 hour(s))  Protime-INR     Status: None   Collection Time: 09/15/16  4:45 PM  Result Value Ref Range   Prothrombin Time 13.6 11.4 - 15.2 seconds   INR 1.04   APTT     Status: Abnormal    Collection Time: 09/15/16  4:45 PM  Result Value Ref Range   aPTT 38 (H) 24 - 36 seconds  CBC     Status: Abnormal   Collection Time: 09/15/16  4:45 PM  Result Value Ref Range   WBC 9.2 3.8 - 10.6 K/uL   RBC 4.06 (L) 4.40 - 5.90 MIL/uL   Hemoglobin 13.3 13.0 - 18.0 g/dL   HCT 38.9 (L) 40.0 - 52.0 %   MCV 95.7 80.0 - 100.0 fL   MCH 32.8 26.0 - 34.0 pg   MCHC 34.2 32.0 - 36.0 g/dL   RDW 13.7 11.5 - 14.5 %   Platelets 124 (L) 150 - 440 K/uL  Differential     Status: Abnormal   Collection Time: 09/15/16  4:45 PM  Result Value Ref Range   Neutrophils Relative % 77 %   Neutro Abs 7.1 (H) 1.4 - 6.5 K/uL   Lymphocytes Relative 14 %   Lymphs Abs 1.3 1.0 - 3.6 K/uL   Monocytes Relative  6 %   Monocytes Absolute 0.6 0.2 - 1.0 K/uL   Eosinophils Relative 3 %   Eosinophils Absolute 0.3 0 - 0.7 K/uL   Basophils Relative 0 %   Basophils Absolute 0.0 0 - 0.1 K/uL  Comprehensive metabolic panel     Status: Abnormal   Collection Time: 09/15/16  4:45 PM  Result Value Ref Range   Sodium 138 135 - 145 mmol/L   Potassium 4.2 3.5 - 5.1 mmol/L   Chloride 106 101 - 111 mmol/L   CO2 24 22 - 32 mmol/L   Glucose, Bld 187 (H) 65 - 99 mg/dL   BUN 20 6 - 20 mg/dL   Creatinine, Ser 1.42 (H) 0.61 - 1.24 mg/dL   Calcium 8.6 (L) 8.9 - 10.3 mg/dL   Total Protein 6.4 (L) 6.5 - 8.1 g/dL   Albumin 3.2 (L) 3.5 - 5.0 g/dL   AST 29 15 - 41 U/L   ALT 24 17 - 63 U/L   Alkaline Phosphatase 77 38 - 126 U/L   Total Bilirubin 0.7 0.3 - 1.2 mg/dL   GFR calc non Af Amer 48 (L) >60 mL/min   GFR calc Af Amer 55 (L) >60 mL/min   Anion gap 8 5 - 15  Troponin I     Status: None   Collection Time: 09/15/16  4:45 PM  Result Value Ref Range   Troponin I <0.03 <0.03 ng/mL   ____________________________________________  EKG My review and personal interpretation at Time: 16:33   Indication: cva  Rate: 70  Rhythm: sinus Axis: normal Other: normal intervals, no acute  ischemia ____________________________________________  RADIOLOGY  I personally reviewed all radiographic images ordered to evaluate for the above acute complaints and reviewed radiology reports and findings.  These findings were personally discussed with the patient.  Please see medical record for radiology report.  ____________________________________________   PROCEDURES  Procedure(s) performed:  Procedures    Critical Care performed: no ____________________________________________   INITIAL IMPRESSION / ASSESSMENT AND PLAN / ED COURSE  Pertinent labs & imaging results that were available during my care of the patient were reviewed by me and considered in my medical decision making (see chart for details).  DDX: cva, tia, hypoglycemia, dehydration, electrolyte abnormality, dissection, sepsis   JACARIUS HANDEL is a 71 y.o. who presents to the ED with complaints of chest pain as well as right facial droop as described above. This does appear new as I just saw the patient 2 weeks ago here in the ER for lower extremity swelling. Blood work otherwise reassuring and shows no explanation for today's symptoms. EKG shows no evidence of acute ischemia and troponin is negative. Based on this chest pain and weakness CT dissection protocol ordered to evaluate for evidence of acute abnormality.  Clinical Course as of Sep 16 2139  Fri Sep 15, 2016  2014 Patient did have contrast extravasation during CT angiogram. Checked his left upper extremity does appear well perfused. We'll continue to monitor. No evidence of significant hemorrhage or mass effect on CT imaging. No evidence of dissection. He has no evidence of acute ischemia on EKG and his troponin is negative. Based on his presentation with persistent neuro deficits or do feel patient will need admission for further evaluation of his stroke.  Have discussed with the patient and available family all diagnostics and treatments performed thus far  and all questions were answered to the best of my ability. The patient demonstrates understanding and agreement with plan.   [PR]  Clinical Course User Index [PR] Merlyn Lot, MD     ____________________________________________   FINAL CLINICAL IMPRESSION(S) / ED DIAGNOSES  Final diagnoses:  Acute right-sided weakness  Facial droop  Chest pain, unspecified type      NEW MEDICATIONS STARTED DURING THIS VISIT:  New Prescriptions   No medications on file     Note:  This document was prepared using Dragon voice recognition software and may include unintentional dictation errors.    Merlyn Lot, MD 09/15/16 2141

## 2016-09-15 NOTE — ED Notes (Signed)
Pt refusing CT scan  

## 2016-09-15 NOTE — H&P (Signed)
[name] Admission History and Physical [name], D.O.  ---------------------------------------------------------------------------------------------------------------------   PATIENT NAME: Adam Merritt MR#: [id number] DATE OF BIRTH: 08/15/1945 DATE OF ADMISSION: 09/15/2016 PRIMARY CARE PHYSICIAN: [name], MD  REQUESTING/REFERRING PHYSICIAN: ED Dr. Quentin Cornwall  CHIEF COMPLAINT: Chief Complaint  Patient presents with  . Numbness  . Chest Pain    HISTORY OF PRESENT ILLNESS: Adam Merritt is a 71 y.o. male with a known history of CAD, DM, GERD, HLD, HTN, COPD, CVA was in a usual state of health until last Night when he noticed right-sided facial droop.Adam Merritt He also reports numbness and tingling on the right side of his face. He went to sleep and woke up this morning with slurred speech, right-sided facial droop and trouble closing his right eye. Of note he has a left-sided facial droop secondary to Bell's palsy which is old. Patient had a TIA or CVA in the past with no residual deficits.  Also of note patient reports left-sided chest pain that radiated to the right side of his chest and through to his back and lasted about one hour. Despite his pressure, 6 out of 10 and resolved on its own.  Patient is being treated for bilateral lower extremity cellulitis and has a home care nurse coming to do wound care.  Otherwise there has been no change in status. Patient has been taking medication as prescribed and there has been no recent change in medication or diet.  There has been no recent illness, travel or sick contacts.    Patient denies fevers/chills, weakness, dizziness, shortness of breath, N/V/C/D, abdominal pain, dysuria/frequency, changes in mental status.   EMS/ED COURSE:  Patient rec'd aspirin, labetalol, Ativan  PAST MEDICAL HISTORY: Past Medical History:  Diagnosis Date  . Anemia   . CAD (coronary artery disease)   . Chest pain   . Coronary  artery disease   . Diabetes mellitus without complication ([place])   . Esophageal reflux   . Herpes zoster without mention of complication   . Hyperlipidemia   . Hypertension   . Insomnia   . Lumbago   . Neuropathy in diabetes ([place])   . Obstructive chronic bronchitis without exacerbation ([place])   . Other malaise and fatigue   . Stroke ([place])   . Varicose veins       PAST SURGICAL HISTORY: Past Surgical History:  Procedure Laterality Date  . CHOLECYSTECTOMY    . CORONARY ARTERY BYPASS GRAFT        SOCIAL HISTORY: Social History  Substance Use Topics  . Smoking status: Never Smoker  . Smokeless tobacco: Never Used  . Alcohol use No      FAMILY HISTORY: Family History  Problem Relation Age of Onset  . Diabetes Mother   . Hyperlipidemia Mother   . Hypertension Mother   . Diabetes Father   . Hyperlipidemia Father   . Hypertension Father      MEDICATIONS AT HOME: Prior to Admission medications   Medication Sig Start Date End Date Taking? Authorizing Provider  aspirin EC 325 MG tablet Take 325 mg by mouth at bedtime.   Yes Historical Provider, MD  clobetasol ointment (TEMOVATE) 1.01 % Apply 1 application topically 2 (two) times daily. Pt applies to lower legs.   Yes Historical Provider, MD  fentaNYL (DURAGESIC - DOSED MCG/HR) 25 MCG/HR patch 1 patch every 3 (three) days. 09/14/16  Yes Historical Provider, MD  Liraglutide (VICTOZA) 18 MG/3ML SOPN Inject 1.8 mg into the skin daily.  Yes Historical Provider, MD  Multiple Vitamin (MULTIVITAMIN WITH MINERALS) TABS tablet Take 1 tablet by mouth daily.   Yes Historical Provider, MD  Oxycodone HCl 10 MG TABS Take 0.5-1 tablets by mouth 3 (three) times daily as needed. 08/31/16  Yes Historical Provider, MD  zolpidem (AMBIEN CR) 12.5 MG CR tablet Take 12.5 mg by mouth at bedtime as needed for sleep.   Yes Historical Provider, MD  albuterol (PROVENTIL HFA;VENTOLIN HFA) 108 (90 BASE) MCG/ACT inhaler Inhale 1 puff into the lungs every 6  (six) hours as needed for wheezing or shortness of breath.    Historical Provider, MD  atorvastatin (LIPITOR) 40 MG tablet Take 20 mg by mouth at bedtime.     Historical Provider, MD  carvedilol (COREG) 25 MG tablet Take 25 mg by mouth 2 (two) times daily with a meal.    Historical Provider, MD  gabapentin (NEURONTIN) 100 MG capsule Take 200 mg by mouth 3 (three) times daily.     Historical Provider, MD  ipratropium-albuterol (DUONEB) 0.5-2.5 (3) MG/3ML SOLN Take 3 mLs by nebulization every 4 (four) hours as needed (for shortness of breath).    Historical Provider, MD  montelukast (SINGULAIR) 10 MG tablet Take 10 mg by mouth daily as needed (for allergies).     Historical Provider, MD  predniSONE (DELTASONE) 10 MG tablet Take 1 tablet (10 mg total) by mouth daily with breakfast. 02/13/15   Adam Leber, MD    DRUG ALLERGIES: No Known Allergies   REVIEW OF SYSTEMS: CONSTITUTIONAL: No fever/chills, fatigue, weakness, weight gain/loss, headache EYES: No blurry or double vision. ENT: No tinnitus, postnasal drip, redness or soreness of the oropharynx. RESPIRATORY: No cough, wheeze, hemoptysis, dyspnea. CARDIOVASCULAR: No chest pain, orthopnea, palpitations, syncope. GASTROINTESTINAL: No nausea, vomiting, constipation, diarrhea, abdominal pain, hematemesis, melena or hematochezia. GENITOURINARY: No dysuria or hematuria. ENDOCRINE: No polyuria or nocturia. No heat or cold intolerance. HEMATOLOGY: No anemia, bruising, bleeding. INTEGUMENTARY: No rashes, ulcers, lesions. MUSCULOSKELETAL: No arthritis, swelling, gout. NEUROLOGIC: No numbness, tingling, weakness or ataxia. No seizure-type activity. PSYCHIATRIC: No anxiety, depression, insomnia.  PHYSICAL EXAMINATION: VITAL SIGNS: Blood pressure (!) 174/104, pulse 72, temperature 97.9 F (36.6 C), temperature source Oral, resp. rate 19, height 5\' 6"  (1.676 m), weight (!) 149.7 kg (330 lb), SpO2 99 %.  GENERAL: 71 y.o.-year-old male patient,  well-developed, well-nourished lying in the bed in no acute distress.  Pleasant and cooperative.   HEENT: Head atraumatic, normocephalic. Pupils equal, round, reactive to light and accommodation. No scleral icterus. Extraocular muscles intact. Nares are patent. Oropharynx is clear. Mucus membranes moist. NECK: Supple, full range of motion. No JVD, no bruit heard. No thyroid enlargement, no tenderness, no cervical lymphadenopathy. CHEST: Normal breath sounds bilaterally. No wheezing, rales, rhonchi or crackles. No use of accessory muscles of respiration.  No reproducible chest wall tenderness.  CARDIOVASCULAR: S1, S2 normal. No murmurs, rubs, or gallops. Cap refill <2 seconds. ABDOMEN: Soft, nontender, nondistended. No rebound, guarding, rigidity. Normoactive bowel sounds present in all four quadrants. No organomegaly or mass. EXTREMITIES: There is massive edema bilateral lower extremities to mid calf with erythema and with chronic hypertrophic skin changes. NEUROLOGIC: There is a right facial droop with tongue deviation to the left, minor speech impairment. Left upper extremity and left lower extremity strength are 5/5, right upper from a right lower extremity strength are 3/5. Sensation is intact. PSYCHIATRIC: The patient is alert and oriented x 3. Normal affect, mood, thought content. SKIN: Warm, dry, and intact without obvious rash, lesion,  or ulcer.  LABORATORY PANEL:  CBC  Recent Labs Lab 09/15/16 1645  WBC 9.2  HGB 13.3  HCT 38.9*  PLT 124*   ----------------------------------------------------------------------------------------------------------------- Chemistries  Recent Labs Lab 09/15/16 1645  NA 138  K 4.2  CL 106  CO2 24  GLUCOSE 187*  BUN 20  CREATININE 1.42*  CALCIUM 8.6*  AST 29  ALT 24  ALKPHOS 77  BILITOT 0.7   ------------------------------------------------------------------------------------------------------------------ Cardiac Enzymes  Recent  Labs Lab 09/15/16 1645  TROPONINI <0.03   ------------------------------------------------------------------------------------------------------------------  RADIOLOGY: Ct Angio Head W Or Wo Contrast  Result Date: 09/15/2016 CLINICAL DATA:  Initial evaluation for right-sided facial numbness with left-sided facial droop. Slurred speech. EXAM: CT ANGIOGRAPHY HEAD AND NECK TECHNIQUE: Multidetector CT imaging of the head and neck was performed using the standard protocol during bolus administration of intravenous contrast. Multiplanar CT image reconstructions and MIPs were obtained to evaluate the vascular anatomy. Carotid stenosis measurements (when applicable) are obtained utilizing NASCET criteria, using the distal internal carotid diameter as the denominator. CONTRAST:  125 cc of Isovue 370. COMPARISON:  Prior CT from 02/13/2015. FINDINGS: CT HEAD FINDINGS Brain: Diffuse prominence of the CSF containing spaces is compatible with generalized cerebral atrophy. Mild chronic microvascular changes present within the periventricular and deep white matter both cerebral hemispheres. No acute intracranial hemorrhage. No definite evidence for acute large vessel territory infarct. Somewhat vague asymmetric hypodensity involving the E left occipital pole is relatively stable from prior, likely related to chronic microvascular ischemic changes. No mass lesion, midline shift or mass effect. No hydrocephalus. No extra-axial fluid collection. Vascular: Prominent vascular calcifications throughout the carotid siphons and distal vertebral arteries. No hyperdense vessel. Skull: Scalp soft tissues within normal limits.  Calvarium intact. Sinuses: Paranasal sinuses and mastoid air cells are clear. Orbits: Globes and oval soft tissues within normal limits. Patient status post lens extraction on the right. CTA NECK FINDINGS Aortic arch: Examination is limited as only at the contrast bolus lies within the arterial vasculature.  Remaining half infiltrated into the patient's arm during injection. Visualized aortic arch of normal caliber with normal branch pattern. Mild atheromatous plaque within the aortic arch and about the origin the great vessels without flow-limiting stenosis. Visualized subclavian arteries widely patent. Right carotid system: Right common carotid artery widely patent from its origin to the bifurcation. A centric calcified plaque about the proximal right ICA without significant stenosis. Right ICA patent distally to the skullbase without stenosis, dissection, or occlusion. Left carotid system: Left common carotid artery patent from its origin to the bifurcation. Mild atheromatous plaque at the proximal left ICA without flow-limiting stenosis. Left ICA patent distally without stenosis, dissection, or occlusion. Vertebral arteries: Both of the vertebral arteries arise from the subclavian arteries. Focal plaque at the origin of the vertebral arteries bilaterally with probable moderate stenoses. Vertebral arteries otherwise patent within the neck. No obvious stenosis or other acute vascular abnormality, although evaluation limited on this exam. Skeleton: No acute osseous abnormality. No worrisome lytic or blastic osseous lesions. Moderate degenerative spondylolysis at C4-5 through C6-7. Other neck: Soft tissues of the neck demonstrate no acute abnormality. No adenopathy. 1 cm hypodense left thyroid nodule noted, of doubtful significance. Upper chest: Visualized mediastinum within normal limits. Visualized lungs are clear. Review of the MIP images confirms the above findings CTA HEAD FINDINGS Anterior circulation: Petrous segments widely patent bilaterally. Moderate atheromatous plaque within the cavernous ICAs with mild to moderate multifocal narrowing. No high-grade flow-limiting stenosis. A1 segments patent. Left A1 segment slightly hypoplastic.  Anterior communicating artery normal. Anterior cerebral arteries patent to  their distal aspects. M1 segments patent without stenosis or occlusion. Distal MCA branches opacified and symmetric. Posterior circulation: Moderate atheromatous plaque within the V4 segments bilaterally without flow-limiting stenosis. Posterior inferior cerebral arteries not well evaluated on this exam. Basilar artery widely patent to its distal aspect. Superior cerebral arteries grossly patent proximally. Both of the posterior cerebral arteries primarily supplied via the basilar artery. PCAs are patent to their distal aspects without obvious high-grade stenosis. Venous sinuses: Patent. Anatomic variants: No significant anatomic variant. No obvious aneurysm or vascular malformation. Delayed phase: No pathologic enhancement. Review of the MIP images confirms the above findings IMPRESSION: CTA NECK IMPRESSION: 1. Somewhat limited study due to timing of the contrast bolus and body habitus. 2. No acute arterial vascular abnormality identified within the neck. No dissection. 3. Mild atheromatous plaque about the carotid bifurcations without flow-limiting stenosis. 4. Atheromatous plaque at the origin of the vertebral arteries with suspected moderate stenosis. Vertebral arteries otherwise patent within the neck. 5. Mild aortic arch atherosclerotic disease without significant stenosis. CTA HEAD IMPRESSION: 1. Somewhat limited study due to timing of the contrast bolus and body habitus. 2. Negative CTA for large vessel occlusion. 3. Moderate atheromatous plaque throughout the carotid siphons and distal vertebral arteries without high-grade stenosis. No high-grade or correctable stenosis within the intracranial circulation. Electronically Signed   By: Jeannine Boga M.D.   On: 09/15/2016 19:55   Ct Angio Neck W And/or Wo Contrast  Result Date: 09/15/2016 CLINICAL DATA:  Initial evaluation for right-sided facial numbness with left-sided facial droop. Slurred speech. EXAM: CT ANGIOGRAPHY HEAD AND NECK TECHNIQUE:  Multidetector CT imaging of the head and neck was performed using the standard protocol during bolus administration of intravenous contrast. Multiplanar CT image reconstructions and MIPs were obtained to evaluate the vascular anatomy. Carotid stenosis measurements (when applicable) are obtained utilizing NASCET criteria, using the distal internal carotid diameter as the denominator. CONTRAST:  125 cc of Isovue 370. COMPARISON:  Prior CT from 02/13/2015. FINDINGS: CT HEAD FINDINGS Brain: Diffuse prominence of the CSF containing spaces is compatible with generalized cerebral atrophy. Mild chronic microvascular changes present within the periventricular and deep white matter both cerebral hemispheres. No acute intracranial hemorrhage. No definite evidence for acute large vessel territory infarct. Somewhat vague asymmetric hypodensity involving the E left occipital pole is relatively stable from prior, likely related to chronic microvascular ischemic changes. No mass lesion, midline shift or mass effect. No hydrocephalus. No extra-axial fluid collection. Vascular: Prominent vascular calcifications throughout the carotid siphons and distal vertebral arteries. No hyperdense vessel. Skull: Scalp soft tissues within normal limits.  Calvarium intact. Sinuses: Paranasal sinuses and mastoid air cells are clear. Orbits: Globes and oval soft tissues within normal limits. Patient status post lens extraction on the right. CTA NECK FINDINGS Aortic arch: Examination is limited as only at the contrast bolus lies within the arterial vasculature. Remaining half infiltrated into the patient's arm during injection. Visualized aortic arch of normal caliber with normal branch pattern. Mild atheromatous plaque within the aortic arch and about the origin the great vessels without flow-limiting stenosis. Visualized subclavian arteries widely patent. Right carotid system: Right common carotid artery widely patent from its origin to the  bifurcation. A centric calcified plaque about the proximal right ICA without significant stenosis. Right ICA patent distally to the skullbase without stenosis, dissection, or occlusion. Left carotid system: Left common carotid artery patent from its origin to the bifurcation. Mild atheromatous plaque at  the proximal left ICA without flow-limiting stenosis. Left ICA patent distally without stenosis, dissection, or occlusion. Vertebral arteries: Both of the vertebral arteries arise from the subclavian arteries. Focal plaque at the origin of the vertebral arteries bilaterally with probable moderate stenoses. Vertebral arteries otherwise patent within the neck. No obvious stenosis or other acute vascular abnormality, although evaluation limited on this exam. Skeleton: No acute osseous abnormality. No worrisome lytic or blastic osseous lesions. Moderate degenerative spondylolysis at C4-5 through C6-7. Other neck: Soft tissues of the neck demonstrate no acute abnormality. No adenopathy. 1 cm hypodense left thyroid nodule noted, of doubtful significance. Upper chest: Visualized mediastinum within normal limits. Visualized lungs are clear. Review of the MIP images confirms the above findings CTA HEAD FINDINGS Anterior circulation: Petrous segments widely patent bilaterally. Moderate atheromatous plaque within the cavernous ICAs with mild to moderate multifocal narrowing. No high-grade flow-limiting stenosis. A1 segments patent. Left A1 segment slightly hypoplastic. Anterior communicating artery normal. Anterior cerebral arteries patent to their distal aspects. M1 segments patent without stenosis or occlusion. Distal MCA branches opacified and symmetric. Posterior circulation: Moderate atheromatous plaque within the V4 segments bilaterally without flow-limiting stenosis. Posterior inferior cerebral arteries not well evaluated on this exam. Basilar artery widely patent to its distal aspect. Superior cerebral arteries grossly  patent proximally. Both of the posterior cerebral arteries primarily supplied via the basilar artery. PCAs are patent to their distal aspects without obvious high-grade stenosis. Venous sinuses: Patent. Anatomic variants: No significant anatomic variant. No obvious aneurysm or vascular malformation. Delayed phase: No pathologic enhancement. Review of the MIP images confirms the above findings IMPRESSION: CTA NECK IMPRESSION: 1. Somewhat limited study due to timing of the contrast bolus and body habitus. 2. No acute arterial vascular abnormality identified within the neck. No dissection. 3. Mild atheromatous plaque about the carotid bifurcations without flow-limiting stenosis. 4. Atheromatous plaque at the origin of the vertebral arteries with suspected moderate stenosis. Vertebral arteries otherwise patent within the neck. 5. Mild aortic arch atherosclerotic disease without significant stenosis. CTA HEAD IMPRESSION: 1. Somewhat limited study due to timing of the contrast bolus and body habitus. 2. Negative CTA for large vessel occlusion. 3. Moderate atheromatous plaque throughout the carotid siphons and distal vertebral arteries without high-grade stenosis. No high-grade or correctable stenosis within the intracranial circulation. Electronically Signed   By: Jeannine Boga M.D.   On: 09/15/2016 19:55   Ct Angio Chest Aorta W And/or Wo Contrast  Result Date: 09/15/2016 CLINICAL DATA:  71 year old male with acute chest pain. Query dissection. EXAM: CT ANGIOGRAPHY CHEST WITH CONTRAST TECHNIQUE: Multidetector CT imaging of the chest was performed using the standard protocol during bolus administration of intravenous contrast. Multiplanar CT image reconstructions and MIPs were obtained to evaluate the vascular anatomy. CONTRAST:  125 mL Isovue 370, an estimated half of which infiltrated into the patient's left forearm at the IV site. COMPARISON:  Chest CTA 01/03/2008 FINDINGS: Cardiovascular: Suboptimal  intravascular contrast enhancement due to extravasation into the left upper extremity. Associated streak artifact from the left upper extremity contrast, most pronounced in the upper abdomen. Previous CABG. Relatively mild Calcified aortic atherosclerosis. No thoracic or upper abdominal aortic aneurysm. No aortic dissection identified. Other major mediastinal vascular structures appear patent. No pericardial effusion. Mediastinum/Nodes: Negative.  No lymphadenopathy. Lungs/Pleura: Atelectatic changes to the major airways. Lower lung volumes. Mild crowding of lung markings. Mild respiratory motion artifact at the lung bases. Stable small right middle lobe pulmonary nodules since 2009 (series 6, images 22 and 23). Stable mild  scarring in the left costophrenic angle. No other abnormal pulmonary opacity. No pleural effusion. Upper Abdomen: Streak artifact from the left upper extremity contrast extravasation. Surgically absent gallbladder. Negative visualized largely noncontrast liver, spleen, pancreas, adrenal glands, kidneys, and bowel in the upper abdomen. Musculoskeletal: Chronic nonunion of the median sternotomy. No acute osseous abnormality identified. Review of the MIP images confirms the above findings. IMPRESSION: 1. Estimated 65 mL contrast extravasation into the patient's left forearm at the IV site. This was discussed by telephone with Dr. Merlyn Lot on 09/15/2016 at 19:50 . 2. No evidence of thoracic or upper abdominal aortic aneurysm or dissection. 3. No acute findings in the chest. Pulmonary atelectasis. Small benign right middle lobe pulmonary nodules. Calcified aortic and coronary artery atherosclerosis with prior CABG. Electronically Signed   By: Genevie Ann M.D.   On: 09/15/2016 19:50    EKG:  NSR@70bpm , normal axis, nonspecific ST-T wave changes  IMPRESSION AND PLAN:  This is a 71 y.o. male with a history of  CAD, DM, GERD, HLD, HTN, COPD, CVA now being admitted with:  1. CVA.  - Admit  telemetry observation for neuro workup including: - Studies: MRA/MRI, Echo, Carotids - Labs: CBC, BMP, Lipids, TFTs, A1C - Nursing: Neurochecks, O2, dysphagia screen, permissive hypertension.  - Consults: Neurology, PT/OT, S/S consults.  - Meds: Daily aspirin 325 mg.   - Fluids: IVNS@75cc /hr.   - Routine DVT Px: with Lovenox, SCDs, early ambulation  2. Chest pain, rule out ACS - Trend troponins, check lipids and TSH. - Morphine, nitro, aspirin and statin ordered.  Hold beta blocker - Check echo - Cardiology consultation has been requested.  3. CKD, stable baseline - Monitor BMP  4. H/O HTN - Hold Coreg for now  5. H/O HLD - Continue Lipitor  6. H/o Diabetes - Accuchecks Q4h with RISS coverage  Admission status: Observation, tele IVFs: NS Diet: NPO Consults: Neuro Code Status: Full Disposition Plan: To home in <24 hours  All the records are reviewed and case discussed with ED provider. Management plans discussed with the patient and/or family who express understanding and agree with plan of care.   TOTAL TIME TAKING CARE OF THIS PATIENT: 60 minutes.   [name] [name] D.O. on 09/15/2016 at 8:59 PM Between 7am to 6pm - Pager - [phone] After 6pm go to [email or web address] - [employer]reader [facility] [place] [facility] Office [phone] CC: Primary care physician; [name], MD     Note: This dictation was prepared with Dragon dictation along with smaller phrase technology. Any transcriptional errors that result from this process are unintentional.

## 2016-09-15 NOTE — ED Notes (Signed)
FIRST NURSE: Pt starting having some weakness yesterday per home health.

## 2016-09-16 ENCOUNTER — Observation Stay
Admit: 2016-09-16 | Discharge: 2016-09-16 | Disposition: A | Discharge status: Home / Self Care | Payer: Medicare Other | Attending: Family Medicine | Admitting: Family Medicine

## 2016-09-16 ENCOUNTER — Observation Stay: Payer: Medicare Other

## 2016-09-16 DIAGNOSIS — G459 Transient cerebral ischemic attack, unspecified: Secondary | ICD-10-CM | POA: Diagnosis not present

## 2016-09-16 LAB — COMPREHENSIVE METABOLIC PANEL
ALT: 18 U/L (ref 17–63)
AST: 25 U/L (ref 15–41)
Albumin: 2.6 g/dL — ABNORMAL LOW (ref 3.5–5.0)
Alkaline Phosphatase: 63 U/L (ref 38–126)
Anion gap: 6 (ref 5–15)
BILIRUBIN TOTAL: 0.8 mg/dL (ref 0.3–1.2)
BUN: 17 mg/dL (ref 6–20)
CHLORIDE: 106 mmol/L (ref 101–111)
CO2: 27 mmol/L (ref 22–32)
Calcium: 8.3 mg/dL — ABNORMAL LOW (ref 8.9–10.3)
Creatinine, Ser: 1.18 mg/dL (ref 0.61–1.24)
GFR calc Af Amer: >60 mL/min (ref >60)
GFR, EST NON AFRICAN AMERICAN: 60 mL/min — AB (ref >60)
Glucose, Bld: 93 mg/dL (ref 65–99)
POTASSIUM: 3.8 mmol/L (ref 3.5–5.1)
Sodium: 139 mmol/L (ref 135–145)
TOTAL PROTEIN: 5.5 g/dL — AB (ref 6.5–8.1)

## 2016-09-16 LAB — URINALYSIS, DIPSTICK ONLY
BILIRUBIN URINE: NEGATIVE
Glucose, UA: NEGATIVE mg/dL
HGB URINE DIPSTICK: NEGATIVE
Ketones, ur: NEGATIVE mg/dL
Leukocytes, UA: NEGATIVE
Nitrite: NEGATIVE
PROTEIN: NEGATIVE mg/dL
Specific Gravity, Urine: 1.017 (ref 1.005–1.030)
pH: 6 (ref 5.0–8.0)

## 2016-09-16 LAB — LIPID PANEL
CHOL/HDL RATIO: 3.5 ratio
Cholesterol: 81 mg/dL (ref 0–200)
HDL: 23 mg/dL — AB (ref >40)
LDL Cholesterol: 35 mg/dL (ref 0–99)
Triglycerides: 114 mg/dL (ref <150)
VLDL: 23 mg/dL (ref 0–40)

## 2016-09-16 LAB — URINE DRUG SCREEN, QUALITATIVE (ARMC ONLY)
Amphetamines, Ur Screen: NOT DETECTED
Barbiturates, Ur Screen: NOT DETECTED
Benzodiazepine, Ur Scrn: NOT DETECTED
COCAINE METABOLITE, UR ~~LOC~~: NOT DETECTED
Cannabinoid 50 Ng, Ur ~~LOC~~: NOT DETECTED
MDMA (ECSTASY) UR SCREEN: NOT DETECTED
Methadone Scn, Ur: NOT DETECTED
OPIATE, UR SCREEN: NOT DETECTED
PHENCYCLIDINE (PCP) UR S: NOT DETECTED
Tricyclic, Ur Screen: NOT DETECTED

## 2016-09-16 LAB — GLUCOSE, CAPILLARY
GLUCOSE-CAPILLARY: 104 mg/dL — AB (ref 65–99)
GLUCOSE-CAPILLARY: 166 mg/dL — AB (ref 65–99)
Glucose-Capillary: 232 mg/dL — ABNORMAL HIGH (ref 65–99)

## 2016-09-16 LAB — CBC
HCT: 35.5 % — ABNORMAL LOW (ref 40.0–52.0)
Hemoglobin: 12.3 g/dL — ABNORMAL LOW (ref 13.0–18.0)
MCH: 33.5 pg (ref 26.0–34.0)
MCHC: 34.7 g/dL (ref 32.0–36.0)
MCV: 96.4 fL (ref 80.0–100.0)
PLATELETS: 114 10*3/uL — AB (ref 150–440)
RBC: 3.68 MIL/uL — ABNORMAL LOW (ref 4.40–5.90)
RDW: 13.5 % (ref 11.5–14.5)
WBC: 8.2 10*3/uL (ref 3.8–10.6)

## 2016-09-16 LAB — TROPONIN I
Troponin I: <0.03 ng/mL (ref <0.03)
Troponin I: <0.03 ng/mL (ref <0.03)

## 2016-09-16 MED ORDER — ENOXAPARIN SODIUM 40 MG/0.4ML ~~LOC~~ SOLN: 40.0000 mg | Freq: Two times a day (BID) | SUBCUTANEOUS | Status: DC

## 2016-09-16 MED ORDER — CLOPIDOGREL BISULFATE 75 MG PO TABS: 75.0000 mg | ORAL_TABLET | Freq: Every day | ORAL | 1 refills | Status: DC

## 2016-09-16 MED ORDER — ASPIRIN 81 MG PO TBEC: 81.0000 mg | DELAYED_RELEASE_TABLET | Freq: Every day | ORAL | 1 refills | Status: DC

## 2016-09-16 MED ORDER — ASPIRIN EC 81 MG PO TBEC
81.0000 mg | DELAYED_RELEASE_TABLET | Freq: Every day | ORAL | Status: DC
  Administered 2016-09-16: 81 mg via ORAL
  Filled 2016-09-16: qty 1

## 2016-09-16 MED ORDER — CLOPIDOGREL BISULFATE 75 MG PO TABS
75.0000 mg | ORAL_TABLET | Freq: Every day | ORAL | Status: DC
  Administered 2016-09-16: 75 mg via ORAL
  Filled 2016-09-16: qty 1

## 2016-09-16 NOTE — Discharge Summary (Signed)
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 Anemia   . CAD (coronary artery disease)   . Chest pain   . Coronary artery disease   . Diabetes mellitus without complication ([place])   . Esophageal reflux   . Herpes zoster without mention of complication   . Hyperlipidemia   . Hypertension   . Insomnia   . Lumbago   . Neuropathy in diabetes ([place])   . Obstructive chronic bronchitis without exacerbation ([place])   . Other malaise and fatigue   . Stroke ([place])   . Varicose veins     HOSPITAL COURSE:   71 y.o. male with a history of  CAD, DM, GERD, HLD, HTN, COPD, CVA now being admitted with:  1. Right facial numbness,droop and weakness-improving suspected TIA  -- Studies: MRA/MRI -pt declined - Carotids--show left ICA mild to moderate stenosis--follow up vascualr as oupt -d/w Neurology Dr. Doy Mince recommends add Plavix. Continue aspirin at 81 mg -Patient advised healthy diet and exercise - Physical therapy recommended home health PT -Lipid profile within normal limits continue statins  2. Chest pain, rule out ACS -  troponins 3 negative - Morphine, nitro, aspirin and statin .  -patient is chest pain-free.  3. Morbid obesity  -Patient advised that an exercise. Recommended healthy choices of meals  4. H/O HTN -Continue home meds  5. H/O HLD - Continue Lipitor  6. H/o Diabetes - Accuchecks Q4h with RISS coverage -Resume home meds  Seen by physical  therapy recommends PT. Home health PT arranged  Discharge home. Discharge instructions discussed with patient's wife. CONSULTS OBTAINED:  Treatment Team:  Alexis Goodell, MD  DRUG ALLERGIES:  No Known Allergies  DISCHARGE MEDICATIONS:   Current Discharge Medication List    START taking these medications   Details  clopidogrel (PLAVIX) 75 MG tablet Take 1 tablet (75 mg total) by mouth daily. Qty: 30 tablet, Refills: 1      CONTINUE these medications which have CHANGED   Details  aspirin EC 81 MG EC tablet Take 1 tablet (81 mg total) by mouth daily. Qty: 30 tablet, Refills: 1      CONTINUE these medications which have NOT CHANGED   Details  clobetasol ointment (TEMOVATE) 1.69 % Apply 1 application topically 2 (two) times daily. Pt applies to lower legs.    fentaNYL (DURAGESIC - DOSED MCG/HR) 25 MCG/HR patch 1 patch every 3 (three) days. Refills: 0    Liraglutide (VICTOZA) 18 MG/3ML SOPN Inject 1.8 mg into the skin daily.     Multiple Vitamin (MULTIVITAMIN WITH MINERALS) TABS tablet Take 1 tablet by mouth daily.    Oxycodone HCl 10 MG TABS Take 0.5-1 tablets by mouth 3 (three) times daily as needed. Refills: 0    zolpidem (AMBIEN CR) 12.5 MG CR tablet Take 12.5 mg by mouth at bedtime as needed for sleep.    albuterol (PROVENTIL HFA;VENTOLIN HFA) 108 (90 BASE) MCG/ACT inhaler Inhale 1 puff into the lungs every 6 (six) hours  as needed for wheezing or shortness of breath.    atorvastatin (LIPITOR) 40 MG tablet Take 20 mg by mouth at bedtime.     carvedilol (COREG) 25 MG tablet Take 25 mg by mouth 2 (two) times daily with a meal.    gabapentin (NEURONTIN) 100 MG capsule Take 200 mg by mouth 3 (three) times daily.     ipratropium-albuterol (DUONEB) 0.5-2.5 (3) MG/3ML SOLN Take 3 mLs by nebulization every 4 (four) hours as needed (for shortness of breath).    montelukast (SINGULAIR) 10 MG tablet Take 10 mg by mouth daily as needed (for allergies).     predniSONE  (DELTASONE) 10 MG tablet Take 1 tablet (10 mg total) by mouth daily with breakfast. Qty: 15 tablet, Refills: 0        If you experience worsening of your admission symptoms, develop shortness of breath, life threatening emergency, suicidal or homicidal thoughts you must seek medical attention immediately by calling 911 or calling your MD immediately  if symptoms less severe.  You Must read complete instructions/literature along with all the possible adverse reactions/side effects for all the Medicines you take and that have been prescribed to you. Take any new Medicines after you have completely understood and accept all the possible adverse reactions/side effects.   Please note  You were cared for by a hospitalist during your hospital stay. If you have any questions about your discharge medications or the care you received while you were in the hospital after you are discharged, you can call the unit and asked to speak with the hospitalist on call if the hospitalist that took care of you is not available. Once you are discharged, your primary care physician will handle any further medical issues. Please note that NO REFILLS for any discharge medications will be authorized once you are discharged, as it is imperative that you return to your primary care physician (or establish a relationship with a primary care physician if you do not have one) for your aftercare needs so that they can reassess your need for medications and monitor your lab values. Today   SUBJECTIVE  Right-sided symptoms improving. Patient feels near baseline   VITAL SIGNS:  Blood pressure (!) 169/100, pulse 90, temperature 98.2 F (36.8 C), temperature source Oral, resp. rate 20, height 5\' 6"  (1.676 m), weight (!) 154.5 kg (340 lb 11.2 oz), SpO2 98 %.  I/O:   Intake/Output Summary (Last 24 hours) at 09/16/16 1245 Last data filed at 09/16/16 0500  Gross per 24 hour  Intake                0 ml  Output             1300 ml   Net            -1300 ml    PHYSICAL EXAMINATION:  GENERAL:  71 y.o.-year-old patient lying in the bed with no acute distress. Morbidly obese EYES: Pupils equal, round, reactive to light and accommodation. No scleral icterus. Extraocular muscles intact.  HEENT: Head atraumatic, normocephalic. Oropharynx and nasopharynx clear.  NECK:  Supple, no jugular venous distention. No thyroid enlargement, no tenderness.  LUNGS: Normal breath sounds bilaterally, no wheezing, rales,rhonchi or crepitation. No use of accessory muscles of respiration.  CARDIOVASCULAR: S1, S2 normal. No murmurs, rubs, or gallops.  ABDOMEN: Soft, non-tender, non-distended. Bowel sounds present. No organomegaly or mass.  EXTREMITIES: No pedal edema, cyanosis, or clubbing. Chronic venous stasis NEUROLOGIC: Cranial nerves II through XII are intact.  Muscle strength 5/5 in all extremities. Sensation intact. Gait not checked.  PSYCHIATRIC: The patient is alert and oriented x 3.  SKIN: No obvious rash, lesion, or ulcer.   DATA REVIEW:   CBC   Recent Labs Lab 09/16/16 0423  WBC 8.2  HGB 12.3*  HCT 35.5*  PLT 114*    Chemistries   Recent Labs Lab 09/16/16 0423  NA 139  K 3.8  CL 106  CO2 27  GLUCOSE 93  BUN 17  CREATININE 1.18  CALCIUM 8.3*  AST 25  ALT 18  ALKPHOS 63  BILITOT 0.8    Microbiology Results   No results found for this or any previous visit (from the past 240 hour(s)).  RADIOLOGY:  Ct Angio Head W Or Wo Contrast  Result Date: 09/15/2016 CLINICAL DATA:  Initial evaluation for right-sided facial numbness with left-sided facial droop. Slurred speech. EXAM: CT ANGIOGRAPHY HEAD AND NECK TECHNIQUE: Multidetector CT imaging of the head and neck was performed using the standard protocol during bolus administration of intravenous contrast. Multiplanar CT image reconstructions and MIPs were obtained to evaluate the vascular anatomy. Carotid stenosis measurements (when applicable) are obtained  utilizing NASCET criteria, using the distal internal carotid diameter as the denominator. CONTRAST:  125 cc of Isovue 370. COMPARISON:  Prior CT from 02/13/2015. FINDINGS: CT HEAD FINDINGS Brain: Diffuse prominence of the CSF containing spaces is compatible with generalized cerebral atrophy. Mild chronic microvascular changes present within the periventricular and deep white matter both cerebral hemispheres. No acute intracranial hemorrhage. No definite evidence for acute large vessel territory infarct. Somewhat vague asymmetric hypodensity involving the E left occipital pole is relatively stable from prior, likely related to chronic microvascular ischemic changes. No mass lesion, midline shift or mass effect. No hydrocephalus. No extra-axial fluid collection. Vascular: Prominent vascular calcifications throughout the carotid siphons and distal vertebral arteries. No hyperdense vessel. Skull: Scalp soft tissues within normal limits.  Calvarium intact. Sinuses: Paranasal sinuses and mastoid air cells are clear. Orbits: Globes and oval soft tissues within normal limits. Patient status post lens extraction on the right. CTA NECK FINDINGS Aortic arch: Examination is limited as only at the contrast bolus lies within the arterial vasculature. Remaining half infiltrated into the patient's arm during injection. Visualized aortic arch of normal caliber with normal branch pattern. Mild atheromatous plaque within the aortic arch and about the origin the great vessels without flow-limiting stenosis. Visualized subclavian arteries widely patent. Right carotid system: Right common carotid artery widely patent from its origin to the bifurcation. A centric calcified plaque about the proximal right ICA without significant stenosis. Right ICA patent distally to the skullbase without stenosis, dissection, or occlusion. Left carotid system: Left common carotid artery patent from its origin to the bifurcation. Mild atheromatous plaque  at the proximal left ICA without flow-limiting stenosis. Left ICA patent distally without stenosis, dissection, or occlusion. Vertebral arteries: Both of the vertebral arteries arise from the subclavian arteries. Focal plaque at the origin of the vertebral arteries bilaterally with probable moderate stenoses. Vertebral arteries otherwise patent within the neck. No obvious stenosis or other acute vascular abnormality, although evaluation limited on this exam. Skeleton: No acute osseous abnormality. No worrisome lytic or blastic osseous lesions. Moderate degenerative spondylolysis at C4-5 through C6-7. Other neck: Soft tissues of the neck demonstrate no acute abnormality. No adenopathy. 1 cm hypodense left thyroid nodule noted, of doubtful significance. Upper chest: Visualized mediastinum within normal limits. Visualized lungs are clear. Review of the MIP images confirms the above  findings CTA HEAD FINDINGS Anterior circulation: Petrous segments widely patent bilaterally. Moderate atheromatous plaque within the cavernous ICAs with mild to moderate multifocal narrowing. No high-grade flow-limiting stenosis. A1 segments patent. Left A1 segment slightly hypoplastic. Anterior communicating artery normal. Anterior cerebral arteries patent to their distal aspects. M1 segments patent without stenosis or occlusion. Distal MCA branches opacified and symmetric. Posterior circulation: Moderate atheromatous plaque within the V4 segments bilaterally without flow-limiting stenosis. Posterior inferior cerebral arteries not well evaluated on this exam. Basilar artery widely patent to its distal aspect. Superior cerebral arteries grossly patent proximally. Both of the posterior cerebral arteries primarily supplied via the basilar artery. PCAs are patent to their distal aspects without obvious high-grade stenosis. Venous sinuses: Patent. Anatomic variants: No significant anatomic variant. No obvious aneurysm or vascular malformation.  Delayed phase: No pathologic enhancement. Review of the MIP images confirms the above findings IMPRESSION: CTA NECK IMPRESSION: 1. Somewhat limited study due to timing of the contrast bolus and body habitus. 2. No acute arterial vascular abnormality identified within the neck. No dissection. 3. Mild atheromatous plaque about the carotid bifurcations without flow-limiting stenosis. 4. Atheromatous plaque at the origin of the vertebral arteries with suspected moderate stenosis. Vertebral arteries otherwise patent within the neck. 5. Mild aortic arch atherosclerotic disease without significant stenosis. CTA HEAD IMPRESSION: 1. Somewhat limited study due to timing of the contrast bolus and body habitus. 2. Negative CTA for large vessel occlusion. 3. Moderate atheromatous plaque throughout the carotid siphons and distal vertebral arteries without high-grade stenosis. No high-grade or correctable stenosis within the intracranial circulation. Electronically Signed   By: Jeannine Boga M.D.   On: 09/15/2016 19:55   Ct Angio Neck W And/or Wo Contrast  Result Date: 09/15/2016 CLINICAL DATA:  Initial evaluation for right-sided facial numbness with left-sided facial droop. Slurred speech. EXAM: CT ANGIOGRAPHY HEAD AND NECK TECHNIQUE: Multidetector CT imaging of the head and neck was performed using the standard protocol during bolus administration of intravenous contrast. Multiplanar CT image reconstructions and MIPs were obtained to evaluate the vascular anatomy. Carotid stenosis measurements (when applicable) are obtained utilizing NASCET criteria, using the distal internal carotid diameter as the denominator. CONTRAST:  125 cc of Isovue 370. COMPARISON:  Prior CT from 02/13/2015. FINDINGS: CT HEAD FINDINGS Brain: Diffuse prominence of the CSF containing spaces is compatible with generalized cerebral atrophy. Mild chronic microvascular changes present within the periventricular and deep white matter both cerebral  hemispheres. No acute intracranial hemorrhage. No definite evidence for acute large vessel territory infarct. Somewhat vague asymmetric hypodensity involving the E left occipital pole is relatively stable from prior, likely related to chronic microvascular ischemic changes. No mass lesion, midline shift or mass effect. No hydrocephalus. No extra-axial fluid collection. Vascular: Prominent vascular calcifications throughout the carotid siphons and distal vertebral arteries. No hyperdense vessel. Skull: Scalp soft tissues within normal limits.  Calvarium intact. Sinuses: Paranasal sinuses and mastoid air cells are clear. Orbits: Globes and oval soft tissues within normal limits. Patient status post lens extraction on the right. CTA NECK FINDINGS Aortic arch: Examination is limited as only at the contrast bolus lies within the arterial vasculature. Remaining half infiltrated into the patient's arm during injection. Visualized aortic arch of normal caliber with normal branch pattern. Mild atheromatous plaque within the aortic arch and about the origin the great vessels without flow-limiting stenosis. Visualized subclavian arteries widely patent. Right carotid system: Right common carotid artery widely patent from its origin to the bifurcation. A centric calcified plaque about the proximal  right ICA without significant stenosis. Right ICA patent distally to the skullbase without stenosis, dissection, or occlusion. Left carotid system: Left common carotid artery patent from its origin to the bifurcation. Mild atheromatous plaque at the proximal left ICA without flow-limiting stenosis. Left ICA patent distally without stenosis, dissection, or occlusion. Vertebral arteries: Both of the vertebral arteries arise from the subclavian arteries. Focal plaque at the origin of the vertebral arteries bilaterally with probable moderate stenoses. Vertebral arteries otherwise patent within the neck. No obvious stenosis or other acute  vascular abnormality, although evaluation limited on this exam. Skeleton: No acute osseous abnormality. No worrisome lytic or blastic osseous lesions. Moderate degenerative spondylolysis at C4-5 through C6-7. Other neck: Soft tissues of the neck demonstrate no acute abnormality. No adenopathy. 1 cm hypodense left thyroid nodule noted, of doubtful significance. Upper chest: Visualized mediastinum within normal limits. Visualized lungs are clear. Review of the MIP images confirms the above findings CTA HEAD FINDINGS Anterior circulation: Petrous segments widely patent bilaterally. Moderate atheromatous plaque within the cavernous ICAs with mild to moderate multifocal narrowing. No high-grade flow-limiting stenosis. A1 segments patent. Left A1 segment slightly hypoplastic. Anterior communicating artery normal. Anterior cerebral arteries patent to their distal aspects. M1 segments patent without stenosis or occlusion. Distal MCA branches opacified and symmetric. Posterior circulation: Moderate atheromatous plaque within the V4 segments bilaterally without flow-limiting stenosis. Posterior inferior cerebral arteries not well evaluated on this exam. Basilar artery widely patent to its distal aspect. Superior cerebral arteries grossly patent proximally. Both of the posterior cerebral arteries primarily supplied via the basilar artery. PCAs are patent to their distal aspects without obvious high-grade stenosis. Venous sinuses: Patent. Anatomic variants: No significant anatomic variant. No obvious aneurysm or vascular malformation. Delayed phase: No pathologic enhancement. Review of the MIP images confirms the above findings IMPRESSION: CTA NECK IMPRESSION: 1. Somewhat limited study due to timing of the contrast bolus and body habitus. 2. No acute arterial vascular abnormality identified within the neck. No dissection. 3. Mild atheromatous plaque about the carotid bifurcations without flow-limiting stenosis. 4. Atheromatous  plaque at the origin of the vertebral arteries with suspected moderate stenosis. Vertebral arteries otherwise patent within the neck. 5. Mild aortic arch atherosclerotic disease without significant stenosis. CTA HEAD IMPRESSION: 1. Somewhat limited study due to timing of the contrast bolus and body habitus. 2. Negative CTA for large vessel occlusion. 3. Moderate atheromatous plaque throughout the carotid siphons and distal vertebral arteries without high-grade stenosis. No high-grade or correctable stenosis within the intracranial circulation. Electronically Signed   By: Jeannine Boga M.D.   On: 09/15/2016 19:55   US Carotid Bilateral (at [facility] Only)  Result Date: 09/16/2016 CLINICAL DATA:  CVA.  Blurry vision.  Morbid obesity. EXAM: BILATERAL CAROTID DUPLEX ULTRASOUND TECHNIQUE: Pearline Cables scale imaging, color Doppler and duplex ultrasound were performed of bilateral carotid and vertebral arteries in the neck. COMPARISON:  None. FINDINGS: Criteria: Quantification of carotid stenosis is based on velocity parameters that correlate the residual internal carotid diameter with NASCET-based stenosis levels, using the diameter of the distal internal carotid lumen as the denominator for stenosis measurement. The following velocity measurements were obtained: RIGHT ICA:  124 cm/sec CCA:  80 cm/sec SYSTOLIC ICA/CCA RATIO:  1.5 DIASTOLIC ICA/CCA RATIO:  2.1 ECA:  185 cm/sec LEFT ICA:  139 cm/sec CCA:  580 cm/sec SYSTOLIC ICA/CCA RATIO:  1.2 DIASTOLIC ICA/CCA RATIO:  1.2 ECA:  117 cm/sec RIGHT CAROTID ARTERY: There is mild focal calcified plaque in the bulb. Low resistance internal  carotid Doppler pattern. RIGHT VERTEBRAL ARTERY:  Antegrade. LEFT CAROTID ARTERY: There is moderate calcified plaque in the left bulb. Low resistance internal carotid Doppler pattern is preserved. Tachycardia is noted. LEFT VERTEBRAL ARTERY:  Antegrade. IMPRESSION: Less than 50% stenosis in the right internal carotid artery. 50-69%  stenosis in the left internal carotid artery. Electronically Signed   By: Marybelle Killings M.D.   On: 09/16/2016 12:09   Ct Angio Chest Aorta W And/or Wo Contrast  Result Date: 09/15/2016 CLINICAL DATA:  71 year old male with acute chest pain. Query dissection. EXAM: CT ANGIOGRAPHY CHEST WITH CONTRAST TECHNIQUE: Multidetector CT imaging of the chest was performed using the standard protocol during bolus administration of intravenous contrast. Multiplanar CT image reconstructions and MIPs were obtained to evaluate the vascular anatomy. CONTRAST:  125 mL Isovue 370, an estimated half of which infiltrated into the patient's left forearm at the IV site. COMPARISON:  Chest CTA 01/03/2008 FINDINGS: Cardiovascular: Suboptimal intravascular contrast enhancement due to extravasation into the left upper extremity. Associated streak artifact from the left upper extremity contrast, most pronounced in the upper abdomen. Previous CABG. Relatively mild Calcified aortic atherosclerosis. No thoracic or upper abdominal aortic aneurysm. No aortic dissection identified. Other major mediastinal vascular structures appear patent. No pericardial effusion. Mediastinum/Nodes: Negative.  No lymphadenopathy. Lungs/Pleura: Atelectatic changes to the major airways. Lower lung volumes. Mild crowding of lung markings. Mild respiratory motion artifact at the lung bases. Stable small right middle lobe pulmonary nodules since 2009 (series 6, images 22 and 23). Stable mild scarring in the left costophrenic angle. No other abnormal pulmonary opacity. No pleural effusion. Upper Abdomen: Streak artifact from the left upper extremity contrast extravasation. Surgically absent gallbladder. Negative visualized largely noncontrast liver, spleen, pancreas, adrenal glands, kidneys, and bowel in the upper abdomen. Musculoskeletal: Chronic nonunion of the median sternotomy. No acute osseous abnormality identified. Review of the MIP images confirms the above  findings. IMPRESSION: 1. Estimated 65 mL contrast extravasation into the patient's left forearm at the IV site. This was discussed by telephone with Dr. Merlyn Lot on 09/15/2016 at 19:50 . 2. No evidence of thoracic or upper abdominal aortic aneurysm or dissection. 3. No acute findings in the chest. Pulmonary atelectasis. Small benign right middle lobe pulmonary nodules. Calcified aortic and coronary artery atherosclerosis with prior CABG. Electronically Signed   By: Genevie Ann M.D.   On: 09/15/2016 19:50     Management plans discussed with the patient, family and they are in agreement.  CODE STATUS:     Code Status Orders        Start     Ordered   09/15/16 2234  Full code  Continuous     09/15/16 2233    Code Status History    Date Active Date Inactive Code Status Order ID Comments User Context   02/12/2015  9:56 PM 02/13/2015  4:59 PM Full Code [id number]  Henreitta Leber, MD Inpatient      TOTAL TIME TAKING CARE OF THIS PATIENT: 40 minutes.    Chaka Jefferys M.D on 09/16/2016 at 12:45 PM  Between 7am to 6pm - Pager - [phone] After 6pm go to [email or web address] - password [email or web address] [facility]  Office  [phone]  CC: Primary care physician; [name], MD

## 2016-09-16 NOTE — Progress Notes (Signed)
Patient is admitted to room 243 with the diagnosis of CVA. Alert and oriented x 4. Tele box reconciled to CCMD with Estill Bamberg NT as a second verifier. Skin assessment done with Maeola Harman RN, noted non pitting edema on bilateral LE , redness on bil. LE as well and MASD to bilateral abdominal folds. Bed alarm activated and the bed is in the lowest position. No acute distress noted. Will continue to monitor.

## 2016-09-16 NOTE — Progress Notes (Signed)
Anticoagulation monitoring(Lovenox):  71yo  M ordered Lovenox 40 mg Q24h  Filed Weights   09/15/16 1629 09/15/16 2239  Weight: (!) 330 lb (149.7 kg) (!) 340 lb 11.2 oz (154.5 kg)   BMI 53.4   Lab Results  Component Value Date   CREATININE 1.18 09/16/2016   CREATININE 1.42 (H) 09/15/2016   CREATININE 2.00 (H) 08/30/2016   Estimated Creatinine Clearance: 80.1 mL/min (by C-G formula based on SCr of 1.18 mg/dL). Hemoglobin & Hematocrit     Component Value Date/Time   HGB 12.3 (L) 09/16/2016 0423   HGB 13.8 09/28/2014 1320   HCT 35.5 (L) 09/16/2016 0423   HCT 40.9 09/28/2014 1320     Per Protocol for Patient with estCrcl > 30 ml/min and BMI > 40, will transition to Lovenox 40 mg Q12h.      Chinita Greenland PharmD Clinical Pharmacist 09/16/2016

## 2016-09-16 NOTE — Evaluation (Signed)
Physical Therapy Evaluation Patient Details Name: TANIA PERROTT MRN: [id number] DOB: May 10, 1946 Today's Date: 09/16/2016   History of Present Illness  Pt is a 71 y.o. male with a known history of CAD, DM, GERD, HLD, HTN, COPD, CVA was in a usual state of health until last night when he noticed right-sided facial droop.Marland Kitchen He also reports numbness and tingling on the right side of his face. He went to sleep and woke up this morning with slurred speech, right-sided facial droop and trouble closing his right eye. Of note he has a left-sided facial droop secondary to Bell's palsy which is old. Patient had a TIA or CVA in the past with no residual deficits  Clinical Impression  Pt is returning home soon with HHPT previously ordered and will resume with pt.  He is getting around well on RW, will need to use his at home and progress with gait and strengthening as ordered previously.  Follow acutely as he remains in hosp and will try stairs as needed for progression of strength.  PT is requesting that pt follow up with HHPT and pt and his wife are in agreement.    Follow Up Recommendations Home health PT;Supervision for mobility/OOB    Equipment Recommendations  None recommended by PT    Recommendations for Other Services       Precautions / Restrictions Precautions Precautions: Fall (telemetry) Restrictions Weight Bearing Restrictions: No      Mobility  Bed Mobility Overal bed mobility: Modified Independent             General bed mobility comments: additional time/effort to perform, no physical assist needed, heavy reliance on bed rails  Transfers Overall transfer level: Needs assistance Equipment used: Rolling walker (2 wheeled);1 person hand held assist Transfers: Sit to/from [facility] Sit to Stand: Min guard Stand pivot transfers: Min assist       General transfer comment: uses good hand placmeent  Ambulation/Gait Ambulation/Gait assistance: Min  guard Ambulation Distance (Feet): 50 Feet Assistive device: Rolling walker (2 wheeled) Gait Pattern/deviations: Step-through pattern;Decreased stride length;Wide base of support;Shuffle Gait velocity: reduced Gait velocity interpretation: Below normal speed for age/gender General Gait Details: has some stiffness in ankles related to chronic LE edema  Stairs            Wheelchair Mobility    Modified Rankin (Stroke Patients Only)       Balance Overall balance assessment: Needs assistance Sitting-balance support: Feet supported Sitting balance-Leahy Scale: Good   Postural control: Posterior lean Standing balance support: Bilateral upper extremity supported Standing balance-Leahy Scale: Fair                               Pertinent Vitals/Pain Pain Assessment: No/denies pain Pain Score: 10-Worst pain ever Pain Location: feet Pain Descriptors / Indicators: Aching Pain Intervention(s): Premedicated before session;Repositioned;Monitored during session;Limited activity within patient's tolerance    Home Living Family/patient expects to be discharged to:: Private residence Living Arrangements: Spouse/significant other Available Help at Discharge: Family;Friend(s);Available 24 hours/day Type of Home: House Home Access: Stairs to enter;Ramped entrance Entrance Stairs-Rails: Right;Left Entrance Stairs-Number of Steps: 4 Home Layout: One level Home Equipment: Walker - 2 wheels;Toilet riser;Shower seat;Hand held Veterinary surgeon - single point      Prior Function Level of Independence: Independent with assistive device(s)         Comments: pt reports able to perform ADL, IADL independently, wife drives, has walker but doesn't  use it, says he will use it moving foward; reports 4-5 falls in past 12 months     Hand Dominance        Extremity/Trunk Assessment   Upper Extremity Assessment Upper Extremity Assessment: RUE deficits/detail RUE Deficits /  Details: weakness on RUE     Lower Extremity Assessment Lower Extremity Assessment: Overall WFL for tasks assessed    Cervical / Trunk Assessment Cervical / Trunk Assessment: Normal  Communication   Communication: No difficulties  Cognition Arousal/Alertness: Awake/alert Behavior During Therapy: WFL for tasks assessed/performed Overall Cognitive Status: Within Functional Limits for tasks assessed                                        General Comments      Exercises Other Exercises Other Exercises: observed ankles are 0 deg DF due to edema   Assessment/Plan    PT Assessment Patient needs continued PT services  PT Problem List Decreased strength;Decreased range of motion;Decreased balance;Decreased activity tolerance;Decreased mobility;Decreased coordination;Decreased knowledge of use of DME;Decreased safety awareness;Cardiopulmonary status limiting activity;Obesity;Decreased skin integrity       PT Treatment Interventions DME instruction;Gait training;Stair training;Functional mobility training;Therapeutic activities;Therapeutic exercise;Neuromuscular re-education;Balance training;Patient/family education    PT Goals (Current goals can be found in the Care Plan section)  Acute Rehab PT Goals Patient Stated Goal: go home PT Goal Formulation: With patient/family Time For Goal Achievement: 09/30/16 Potential to Achieve Goals: Good    Frequency Min 2X/week   Barriers to discharge Inaccessible home environment ramp/stairs    Co-evaluation               End of Session   Activity Tolerance: Patient tolerated treatment well;Patient limited by fatigue Patient left: in chair;with call bell/phone within reach;with family/visitor present Nurse Communication: Mobility status PT Visit Diagnosis: Unsteadiness on feet (R26.81)    Time: 7517-0017 PT Time Calculation (min) (ACUTE ONLY): 30 min   Charges:   PT Evaluation $PT Eval Moderate Complexity: 1  Procedure PT Treatments $Gait Training: 8-22 mins   PT G Codes:   PT G-Codes **NOT FOR INPATIENT CLASS** Functional Assessment Tool Used: AM-PAC 6 Clicks Basic Mobility;Clinical judgement Functional Limitation: Mobility: Walking and moving around Mobility: Walking and Moving Around Current Status (C9449): At least 20 percent but less than 40 percent impaired, limited or restricted Mobility: Walking and Moving Around Goal Status [phone]): At least 1 percent but less than 20 percent impaired, limited or restricted     Ramond Dial 09/16/2016, 1:52 PM   Mee Hives, PT MS Acute Rehab Dept. Number: [place] and [place]

## 2016-09-16 NOTE — Progress Notes (Signed)
Patient passed swallowing screen x 2 both on the floor and at ER. Dr. Ara Kussmaul notified and she indicated she will place a diet order in. Will continue to monitor.

## 2016-09-16 NOTE — Discharge Instructions (Signed)

## 2016-09-16 NOTE — Care Management Note (Addendum)
Case Management Note  Patient Details  Name: Adam Merritt MRN: [id number] Date of Birth: [birth year]  Subjective/Objective:      A referral for HH-PT was called to [place] at [facility].                Action/Plan:   Expected Discharge Date:  09/16/16               Expected Discharge Plan:     In-House Referral:     Discharge planning Services     Post Acute Care Choice:    Choice offered to:     DME Arranged:    DME Agency:     HH Arranged:    HH Agency:     Status of Service:     If discussed at [employer], dates discussed:    Additional Comments:  Libra Gatz A, RN 09/16/2016, 12:49 PM

## 2016-09-16 NOTE — Evaluation (Signed)
Speech Language Pathology Evaluation Patient Details Name: Adam Merritt MRN: [id number] DOB: 1946/02/19 Today's Date: 09/16/2016 Time: 1030-1100 SLP Time Calculation (min) (ACUTE ONLY): 30 min  Problem List:  Patient Active Problem List   Diagnosis Date Noted  . CVA (cerebral vascular accident) ([place]) 09/15/2016  . CVA (cerebral infarction) 02/12/2015  . Peripheral vascular disease, unspecified ([place]) 09/29/2013   Past Medical History:  Past Medical History:  Diagnosis Date  . Anemia   . CAD (coronary artery disease)   . Chest pain   . Coronary artery disease   . Diabetes mellitus without complication ([place])   . Esophageal reflux   . Herpes zoster without mention of complication   . Hyperlipidemia   . Hypertension   . Insomnia   . Lumbago   . Neuropathy in diabetes ([place])   . Obstructive chronic bronchitis without exacerbation ([place])   . Other malaise and fatigue   . Stroke ([place])   . Varicose veins    Past Surgical History:  Past Surgical History:  Procedure Laterality Date  . CHOLECYSTECTOMY    . CORONARY ARTERY BYPASS GRAFT     HPI:      Assessment / Plan / Recommendation Clinical Impression  pt presents with a mild dysarthria characterized by a baseline left sided bels palsy and new right sided weakness. Pt states that speech is much harder to produce and is having to repeat himself. pt was noted to have left and right sided facial droop with right side being new. SLP instructed pt on ROM excercises and strengthening activities to improve speech intellegibility.     SLP Assessment  SLP Recommendation/Assessment: Patient needs continued Speech Lanaguage Pathology Services SLP Visit Diagnosis: Dysarthria and anarthria (R47.1)    Follow Up Recommendations       Frequency and Duration min 3x week  1 week      SLP Evaluation Cognition  Overall Cognitive Status: Within Functional Limits for tasks assessed Arousal/Alertness: Awake/alert Orientation Level: Oriented  X4 Attention: Focused Focused Attention: Appears intact Memory: Appears intact Awareness: Appears intact Problem Solving: Appears intact Executive Function: Reasoning;Sequencing Reasoning: Appears intact Sequencing: Appears intact Behaviors: Restless Safety/Judgment: Appears intact       Comprehension  Auditory Comprehension Overall Auditory Comprehension: Appears within functional limits for tasks assessed Yes/No Questions: Within Functional Limits Commands: Within Functional Limits Conversation: Complex Visual Recognition/Discrimination Discrimination: Within Function Limits Reading Comprehension Reading Status: Within funtional limits    Expression Expression Primary Mode of Expression: Verbal Verbal Expression Overall Verbal Expression: Appears within functional limits for tasks assessed Repetition: No impairment Naming: No impairment Pragmatics: No impairment   Oral / Motor  Oral Motor/Sensory Function Overall Oral Motor/Sensory Function: Mild impairment Facial ROM: Reduced right;Reduced left Facial Symmetry: Abnormal symmetry right;Abnormal symmetry left Facial Strength: Reduced right;Reduced left Lingual Strength: Reduced Motor Speech Overall Motor Speech: Impaired Respiration: Within functional limits Phonation: Normal Resonance: Within functional limits Articulation: Impaired Level of Impairment: Phrase Intelligibility: Intelligible Motor Planning: Witnin functional limits Motor Speech Errors: Not applicable   GO                    Adam Merritt 09/16/2016, 11:41 AM

## 2016-09-16 NOTE — Evaluation (Signed)
Occupational Therapy Evaluation Patient Details Name: Adam Merritt MRN: [id number] DOB: Aug 05, 1945 Today's Date: 09/16/2016    History of Present Illness Pt is a 71 y.o. male with a known history of CAD, DM, GERD, HLD, HTN, COPD, CVA was in a usual state of health until last night when he noticed right-sided facial droop.Marland Kitchen He also reports numbness and tingling on the right side of his face. He went to sleep and woke up this morning with slurred speech, right-sided facial droop and trouble closing his right eye. Of note he has a left-sided facial droop secondary to Bell's palsy which is old. Patient had a TIA or CVA in the past with no residual deficits   Clinical Impression   Pt seen for OT evaluation this date. Pt presents with impairments in RUE strength, decreased activity tolerance, decreased knowledge of DME/AE for ADL, and increased falls risk (hx of 4-5 falls in past 12 months). Pt will benefit from skilled OT services to address noted impairments and functional deficits in dressing, bathing, to maximize return to PLOF and minimize future falls risk, injury, and rehospitalization. Recommend HHOT services following hospitalization.    Follow Up Recommendations  Home health OT    Equipment Recommendations  None recommended by OT    Recommendations for Other Services       Precautions / Restrictions Precautions Precautions: Fall Restrictions Weight Bearing Restrictions: No      Mobility Bed Mobility Overal bed mobility: Modified Independent             General bed mobility comments: additional time/effort to perform, no physical assist needed, heavy reliance on bed rails  Transfers Overall transfer level: Needs assistance   Transfers: Sit to/from Stand Sit to Stand: Min guard         General transfer comment: no LOB, good confidence    Balance Overall balance assessment: History of Falls;Needs assistance Sitting-balance support: No upper extremity  supported;Feet supported Sitting balance-Leahy Scale: Good     Standing balance support: Single extremity supported Standing balance-Leahy Scale: Good                             ADL either performed or assessed with clinical judgement   ADL Overall ADL's : Needs assistance/impaired Eating/Feeding: Sitting;Set up   Grooming: Sitting;Set up   Upper Body Bathing: Set up;Sitting;Supervision/ safety   Lower Body Bathing: Minimal assistance;Sitting/lateral leans;Sit to/from stand   Upper Body Dressing : Sitting;Set up   Lower Body Dressing: Minimal assistance;Sitting/lateral leans;Sit to/from stand Lower Body Dressing Details (indicate cue type and reason): min assist to initiate donning over feet; pt may benefit from AE for dressing to improve functional independence   Toilet Transfer Details (indicate cue type and reason): deferred due to 10/10 pain/safety           General ADL Comments: pt generally min assist for LB ADL     Vision Baseline Vision/History: Wears glasses (pt reports [facility] is "trying to figure out what's going on with my right eye. It's like looking at nighttime when I look out that eye.") Wears Glasses: At all times Patient Visual Report: Blurring of vision (pt reports slight blurriness before hospitalization as well, no worsening at this time) Vision Assessment?: Vision impaired- to be further tested in functional context Additional Comments: visual impairment difficult to assess, appears not to be functionally limiting pt other than no longer driving "because my wife won't let me"  Perception     Praxis Praxis Praxis tested?: Within functional limits    Pertinent Vitals/Pain Pain Assessment: 0-10 Pain Score: 10-Worst pain ever Pain Location: feet Pain Descriptors / Indicators: Aching Pain Intervention(s): Premedicated before session;Repositioned;Monitored during session;Limited activity within patient's tolerance     Hand Dominance      Extremity/Trunk Assessment Upper Extremity Assessment Upper Extremity Assessment: RUE deficits/detail (LUE WFL) RUE Deficits / Details: ROM WFL, grossly 3+/5, intact sensation   Lower Extremity Assessment Lower Extremity Assessment: Defer to PT evaluation;Generalized weakness   Cervical / Trunk Assessment Cervical / Trunk Assessment: Normal   Communication Communication Communication: No difficulties   Cognition Arousal/Alertness: Awake/alert Behavior During Therapy: WFL for tasks assessed/performed Overall Cognitive Status: Within Functional Limits for tasks assessed                                     General Comments       Exercises Other Exercises Other Exercises: pt educated in safety/falls prevention, importance of using RW at home for mobility   Shoulder Instructions      Home Living Family/patient expects to be discharged to:: Private residence Living Arrangements: Spouse/significant other Available Help at Discharge: Family;Friend(s);Available 24 hours/day Type of Home: House Home Access: Stairs to enter [employer] of Steps: 4 Entrance Stairs-Rails: Right;Left (cannot reach both at same time) Home Layout: One level     Bathroom Shower/Tub: Walk-in shower;Door   Bathroom Toilet: Handicapped height (standard with riser attached) Bathroom Accessibility: Yes How Accessible: Accessible via walker Home Equipment: Walker - 2 wheels;Toilet riser;Shower seat;Hand held shower head;Cane - single point          Prior Functioning/Environment Level of Independence: Independent with assistive device(s)        Comments: pt reports able to perform ADL, IADL independently, wife drives, has walker but doesn't use it, says he will use it moving foward; reports 4-5 falls in past 12 months        OT Problem List: Decreased strength;Pain;Decreased activity tolerance;Obesity;Decreased knowledge of use of DME or AE;Impaired UE functional use       OT Treatment/Interventions: Self-care/ADL training;Energy conservation;DME and/or AE instruction;Patient/family education;Therapeutic exercise;Therapeutic activities    OT Goals(Current goals can be found in the care plan section) Acute Rehab OT Goals Patient Stated Goal: go home OT Goal Formulation: With patient Time For Goal Achievement: 09/30/16 Potential to Achieve Goals: Good  OT Frequency: Min 1X/week   Barriers to D/C:            Co-evaluation              End of Session    Activity Tolerance: Patient limited by pain Patient left: in bed;with call bell/phone within reach;Other (comment) (SLP in room to assess)  OT Visit Diagnosis: Repeated falls (R29.6);History of falling (Z91.81);Muscle weakness (generalized) (M62.81);Other symptoms and signs involving cognitive function;Pain Pain - Right/Left:  (both) Pain - part of body: Ankle and joints of foot                Time: 0950-1013 OT Time Calculation (min): 23 min Charges:  OT General Charges $OT Visit: 1 Procedure OT Evaluation $OT Eval Moderate Complexity: 1 Procedure OT Treatments $Self Care/Home Management : 8-22 mins G-Codes: OT G-codes **NOT FOR INPATIENT CLASS** Functional Assessment Tool Used: AM-PAC 6 Clicks Daily Activity;Clinical judgement Functional Limitation: Self care Self Care Current Status (I7124): At least 20 percent but less than 40  percent impaired, limited or restricted Self Care Goal Status (I9485): At least 1 percent but less than 20 percent impaired, limited or restricted   Jeni Salles, MPH, MS, OTR/L ascom [phone] 09/16/16, 10:44 AM

## 2016-09-17 LAB — HEMOGLOBIN A1C
Hgb A1c MFr Bld: 9 % — ABNORMAL HIGH (ref 4.8–5.6)
MEAN PLASMA GLUCOSE: 212 mg/dL

## 2016-09-17 LAB — ECHOCARDIOGRAM COMPLETE
Height: 66 in
Weight: 5451.2 [oz_av]

## 2016-09-28 ENCOUNTER — Encounter: Payer: Medicare Other | Attending: Surgery | Admitting: Surgery

## 2016-09-28 DIAGNOSIS — I2510 Atherosclerotic heart disease of native coronary artery without angina pectoris: Secondary | ICD-10-CM | POA: Diagnosis not present

## 2016-09-28 DIAGNOSIS — Z9221 Personal history of antineoplastic chemotherapy: Secondary | ICD-10-CM | POA: Diagnosis not present

## 2016-09-28 DIAGNOSIS — I890 Lymphedema, not elsewhere classified: Secondary | ICD-10-CM | POA: Insufficient documentation

## 2016-09-28 DIAGNOSIS — J449 Chronic obstructive pulmonary disease, unspecified: Secondary | ICD-10-CM | POA: Insufficient documentation

## 2016-09-28 DIAGNOSIS — Z8673 Personal history of transient ischemic attack (TIA), and cerebral infarction without residual deficits: Secondary | ICD-10-CM | POA: Diagnosis not present

## 2016-09-28 DIAGNOSIS — L97311 Non-pressure chronic ulcer of right ankle limited to breakdown of skin: Secondary | ICD-10-CM | POA: Diagnosis not present

## 2016-09-28 DIAGNOSIS — Z9119 Patient's noncompliance with other medical treatment and regimen: Secondary | ICD-10-CM | POA: Diagnosis not present

## 2016-09-28 DIAGNOSIS — L97221 Non-pressure chronic ulcer of left calf limited to breakdown of skin: Secondary | ICD-10-CM | POA: Diagnosis not present

## 2016-09-28 DIAGNOSIS — E1140 Type 2 diabetes mellitus with diabetic neuropathy, unspecified: Secondary | ICD-10-CM | POA: Diagnosis not present

## 2016-09-28 DIAGNOSIS — I10 Essential (primary) hypertension: Secondary | ICD-10-CM | POA: Insufficient documentation

## 2016-09-28 DIAGNOSIS — E11620 Type 2 diabetes mellitus with diabetic dermatitis: Secondary | ICD-10-CM | POA: Insufficient documentation

## 2016-09-28 DIAGNOSIS — E1151 Type 2 diabetes mellitus with diabetic peripheral angiopathy without gangrene: Secondary | ICD-10-CM | POA: Diagnosis not present

## 2016-09-28 DIAGNOSIS — Z6841 Body Mass Index (BMI) 40.0 and over, adult: Secondary | ICD-10-CM | POA: Insufficient documentation

## 2016-09-28 DIAGNOSIS — G4730 Sleep apnea, unspecified: Secondary | ICD-10-CM | POA: Diagnosis not present

## 2016-09-29 NOTE — Progress Notes (Signed)
ROURKE, MCQUITTY ([id number]) Visit Report for 09/28/2016 Abuse/Suicide Risk Screen Details Patient Name: Adam Merritt, Adam Merritt Date of Service: 09/28/2016 8:00 AM Medical Record Number: [id number] Patient Account Number: [id number] Date of Birth/Sex: [birth year] (71 y.o. Male) Treating RN: Montey Hora Primary Care Guenevere Roorda: [name] Other Clinician: Referring Reann Dobias: [name] Treating Reghan Thul/Extender: Frann Rider in Treatment: 0 Abuse/Suicide Risk Screen Items Answer ABUSE/SUICIDE RISK SCREEN: Has anyone close to you tried to hurt or harm you recentlyo No Do you feel uncomfortable with anyone in your familyo No Has anyone forced you do things that you didnot want to doo No Do you have any thoughts of harming yourselfo No Patient displays signs or symptoms of abuse and/or neglect. No Electronic Signature(s) Signed: 09/28/2016 5:14:16 PM By: Montey Hora Entered By: Montey Hora on 09/28/2016 08:24:06 Adam Merritt ([id number]) -------------------------------------------------------------------------------- Activities of Daily Living Details Patient Name: Adam Merritt Date of Service: 09/28/2016 8:00 AM Medical Record Number: [id number] Patient Account Number: [id number] Date of Birth/Sex: Jan 18, 1946 (71 y.o. Male) Treating RN: Montey Hora Primary Care Rael Yo: [name] Other Clinician: Referring Marwin Primmer: [name] Treating Siara Gorder/Extender: Frann Rider in Treatment: 0 Activities of Daily Living Items Answer Activities of Daily Living (Please select one for each item) Drive Automobile Need Assistance Take Medications Completely Able Use Telephone Completely [place] for Appearance Completely Able Use Toilet Completely Able Bath / Shower Need Assistance Dress Self Need Assistance Feed Self Completely Able Walk Need Assistance Get In / Out Bed Need Assistance Housework Need Assistance Prepare Meals Need Assistance Handle Money  Completely Able Shop for Self Need Assistance Electronic Signature(s) Signed: 09/28/2016 5:14:16 PM By: Montey Hora Entered By: Montey Hora on 09/28/2016 08:24:43 Adam Merritt ([id number]) -------------------------------------------------------------------------------- Education Assessment Details Patient Name: Adam Merritt Date of Service: 09/28/2016 8:00 AM Medical Record Number: [id number] Patient Account Number: [id number] Date of Birth/Sex: [birth year] (71 y.o. Male) Treating RN: Montey Hora Primary Care Jontavia Leatherbury: [name] Other Clinician: Referring Arraya Buck: [name] Treating Penny Frisbie/Extender: Frann Rider in Treatment: 0 Primary Learner Assessed: Caregiver Reason Patient is not Primary Learner: wound location Learning Preferences/Education Level/Primary Language Learning Preference: Explanation, Demonstration Highest Education Level: High School Preferred Language: English Cognitive Barrier Assessment/Beliefs Language Barrier: No Translator Needed: No Memory Deficit: No Emotional Barrier: No Cultural/Religious Beliefs Affecting Medical No Care: Physical Barrier Assessment Impaired Vision: No Impaired Hearing: No Decreased Hand dexterity: No Knowledge/Comprehension Assessment Knowledge Level: Medium Comprehension Level: Medium Ability to understand written Medium instructions: Ability to understand verbal Medium instructions: Motivation Assessment Anxiety Level: Calm Cooperation: Cooperative Education Importance: Acknowledges Need Interest in Health Problems: Uninterested Perception: Coherent Willingness to [employer] in Self- Low Management Activities: Readiness to Engage in Self- Low Management Activities: VERNA, HAMON ([id number]) Electronic Signature(s) Signed: 09/28/2016 5:14:16 PM By: Montey Hora Entered By: Montey Hora on 09/28/2016 08:25:15 Adam Merritt  ([id number]) -------------------------------------------------------------------------------- Fall Risk Assessment Details Patient Name: Adam Merritt Date of Service: 09/28/2016 8:00 AM Medical Record Number: [id number] Patient Account Number: [id number] Date of Birth/Sex: 10-Aug-1945 (71 y.o. Male) Treating RN: Montey Hora Primary Care Troy Hartzog: [name] Other Clinician: Referring Aki Abalos: [name] Treating Kalden Wanke/Extender: Frann Rider in Treatment: 0 Fall Risk Assessment Items Have you had 2 or more falls in the last 12 monthso 0 Yes Have you had any fall that resulted in injury in the last 12 monthso 0 No FALL RISK ASSESSMENT: History of falling - immediate or within 3 months 25 Yes Secondary diagnosis 0 No Ambulatory aid None/bed rest/wheelchair/nurse  0 No Crutches/cane/walker 15 Yes Furniture 0 No IV Access/Saline Lock 0 No Gait/Training Normal/bed rest/immobile 0 No Weak 10 Yes Impaired 0 No Mental Status Oriented to own ability 0 Yes Electronic Signature(s) Signed: 09/28/2016 5:14:16 PM By: Montey Hora Entered By: Montey Hora on 09/28/2016 08:25:30 Adam Merritt ([id number]) -------------------------------------------------------------------------------- Foot Assessment Details Patient Name: Adam Merritt Date of Service: 09/28/2016 8:00 AM Medical Record Number: [id number] Patient Account Number: [id number] Date of Birth/Sex: May 07, 1946 (71 y.o. Male) Treating RN: Montey Hora Primary Care Marylouise Mallet: [name] Other Clinician: Referring Ayisha Pol: [name] Treating Tayanna Talford/Extender: Frann Rider in Treatment: 0 Foot Assessment Items Site Locations + = Sensation present, - = Sensation absent, C = Callus, U = Ulcer R = Redness, W = Warmth, M = Maceration, PU = Pre-ulcerative lesion F = Fissure, S = Swelling, D = Dryness Assessment Right: Left: Other Deformity: No No Prior Foot Ulcer: No No Prior Amputation: No  No Charcot Joint: No No Ambulatory Status: Ambulatory With Help Assistance Device: Cane Gait: Buyer, retail Signature(s) Signed: 09/28/2016 5:14:16 PM By: Montey Hora Entered By: Montey Hora on 09/28/2016 08:29:56 Adam Merritt ([id number]) -------------------------------------------------------------------------------- Nutrition Risk Assessment Details Patient Name: Adam Merritt Date of Service: 09/28/2016 8:00 AM Medical Record Number: [id number] Patient Account Number: [id number] Date of Birth/Sex: Sep 05, 1945 (71 y.o. Male) Treating RN: Montey Hora Primary Care Margaret Cockerill: [name] Other Clinician: Referring Tynisha Ogan: [name] Treating Jaylan Hinojosa/Extender: Frann Rider in Treatment: 0 Height (in): 65 Weight (lbs): 336 Body Mass Index (BMI): 55.9 Nutrition Risk Assessment Items NUTRITION RISK SCREEN: I have an illness or condition that made me change the kind and/or 0 No amount of food I eat I eat fewer than two meals per day 0 No I eat few fruits and vegetables, or milk products 0 No I have three or more drinks of beer, liquor or wine almost every day 0 No I have tooth or mouth problems that make it hard for me to eat 0 No I don't always have enough money to buy the food I need 0 No I eat alone most of the time 0 No I take three or more different prescribed or over-the-counter drugs a 1 Yes day Without wanting to, I have lost or gained 10 pounds in the last six 0 No months I am not always physically able to shop, cook and/or feed myself 0 No Nutrition Protocols Good Risk Protocol 0 No interventions needed Moderate Risk Protocol Electronic Signature(s) Signed: 09/28/2016 5:14:16 PM By: Montey Hora Entered By: Montey Hora on 09/28/2016 08:25:36

## 2016-09-29 NOTE — Progress Notes (Signed)
Adam Merritt, Adam Merritt ([id number]) Visit Report for 09/28/2016 Chief Complaint Document Details Patient Name: Adam Merritt, Adam Merritt Date of Service: 09/28/2016 8:00 AM Medical Record Number: [id number] Patient Account Number: [id number] Date of Birth/Sex: Apr 25, 1946 (71 y.o. Male) Treating RN: Montey Hora Primary Care Provider: [name] Other Clinician: Referring Provider: [name] Treating Provider/Extender: Frann Rider in Treatment: 0 Information Obtained from: Patient Chief Complaint the patient comes along with his wife for excessive swelling of both lower extremities left more than right and weeping of fluid for over 2 months now Electronic Signature(s) Signed: 09/28/2016 8:59:56 AM By: Christin Fudge MD, FACS Entered By: Christin Fudge on 09/28/2016 08:59:56 Adam Merritt ([id number]) -------------------------------------------------------------------------------- HPI Details Patient Name: Adam Merritt Date of Service: 09/28/2016 8:00 AM Medical Record Number: [id number] Patient Account Number: [id number] Date of Birth/Sex: March 12, 1946 (71 y.o. Male) Treating RN: Montey Hora Primary Care Provider: [name] Other Clinician: Referring Provider: [name] Treating Provider/Extender: Frann Rider in Treatment: 0 History of Present Illness Location: bilateral lower leg edema with some blisters recently and weeping of fluid left side more than right Quality: Patient reports experiencing a dull pain to affected area(s). Severity: Patient states wound are getting worse. Duration: Patient has had the wound for > 3 months prior to seeking treatment at the wound center Timing: Pain in wound is Intermittent (comes and goes Context: The wound appeared gradually over time Modifying Factors: Consults to this date include: he has been seen by his PCP and she sent him off to see [place]. Associated Signs and Symptoms: Patient reports having difficulty standing for long  periods. HPI Description: 71 year old gentleman seen earlier at our clinic in June 2016 and again in June 2017 is noncompliant with recommendations and does not follow-up regularly. 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 He had referred him to the lymphedema clinic and he was accepted but he did not follow-up with them. At that stage he had agreed to get a repeat arterial study done and he has seen Dr. Trula Slade in [place]. This time around he has been seen by his PCP Dr. Tiburcio Bash who referred him urgently to [place] for bilateral lower extremity cellulitis in spite of being on antibiotics -- he was placed on Keflex and 500 mg 3 times a day for 10 days. His comorbidities include coronary artery disease, diabetes mellitus, hypertension, Bell's palsy and chronic lymphedema. he was also recently admitted to the hospital on April 13 and kept overnight with the diagnosis of a TIA and a left ICA stenosis to follow-up with vascular surgery as an outpatient ==== Old Notes: 71 year old gentleman who comes with bilateral lower limb edema for over 20 years and recent attack of blisters and redness for about 2 weeks. The patient says he has had bilateral lower limb edema since 1996. He has been noncompliant with wearing wraps in the past and even was given a boot to wear at night which she never tolerated and didn't wear it. He has a past medical history of sleep apnea, neuropathy with diabetes, hypertension, hyperlipidemia, coronary artery disease, varicose veins, carotid artery disease and is status post CABG and cholecystectomy. He was seen by the vascular surgeon on 09/29/2013 and Dr. Trula Slade opinion was:"I have reviewed his outside ultrasound which shows an ankle-brachial  index of 0.72 on the  left and 1.0 on the right.o This study was repeated at our office.o The ankle-brachial index on the right was 1.26 with triphasic waveforms.o On MERLE, CIRELLI. ([id number]) the left it was a 1.04 with biphasic waveforms.o The patient had a toe pressure of 121 on the right and 100 on the left. In summary, I would not recommend any further imaging , as I do not think his symptoms are related to arterial insufficiency, but rather secondary to diabetic neuropathy.o The patient still has room for increasing his Neurontin dose, which he states does help him.o I have scheduled him to come back in one year for a repeat arterial evaluation." in June 2016, a year ago, he has a venous duplex study to be done. He was also going to be accepted to the lymphedema clinic but I did not believe the patient went regularly for this. ==== Electronic Signature(s) Signed: 09/28/2016 9:00:34 AM By: Christin Fudge MD, FACS Previous Signature: 09/28/2016 8:32:42 AM Version By: Christin Fudge MD, FACS Previous Signature: 09/28/2016 8:31:58 AM Version By: Christin Fudge MD, FACS Entered By: Christin Fudge on 09/28/2016 09:00:34 Adam Merritt ([id number]) -------------------------------------------------------------------------------- Physical Exam Details Patient Name: Adam Merritt Date of Service: 09/28/2016 8:00 AM Medical Record Number: [id number] Patient Account Number: [id number] Date of Birth/Sex: [birth year] (71 y.o. Male) Treating RN: Montey Hora Primary Care Provider: [name] Other Clinician: Referring Provider: [name] Treating Provider/Extender: Frann Rider in Treatment: 0 Constitutional . Pulse regular. Respirations normal and unlabored. Afebrile. . Eyes Nonicteric. Reactive to light. Ears, Nose, Mouth, and Throat Lips, teeth, and gums WNL.Marland Kitchen Moist mucosa without lesions. Neck supple and nontender. No palpable supraclavicular or cervical adenopathy. Normal sized without  goiter. Respiratory WNL. No retractions.. Cardiovascular Pedal Pulses WNL. ABI was noncompressible bilaterally. stage III lymphedema left side worse than right side. Chest Breasts symmetical and no nipple discharge.. Breast tissue WNL, no masses, lumps, or tenderness.. Gastrointestinal (GI) Abdomen without masses or tenderness.. No liver or spleen enlargement or tenderness.. Lymphatic No adneopathy. No adenopathy. No adenopathy. Musculoskeletal Adexa without tenderness or enlargement.. Digits and nails w/o clubbing, cyanosis, infection, petechiae, ischemia, or inflammatory conditions.. Integumentary (Hair, Skin) No suspicious lesions. No crepitus or fluctuance. No peri-wound warmth or erythema. No masses.Marland Kitchen Psychiatric Judgement and insight Intact.. No evidence of depression, anxiety, or agitation.. Notes the patient has massive bilateral lymphedema left worse than right and the left side has breakdown of skin with micro-ulcerations and oozing with some evidence of deeper breakdown of skin. Right side as was a very tiny ulcer which is limited to breakdown of skin. Electronic Signature(s) Signed: 09/28/2016 9:01:42 AM By: Christin Fudge MD, FACS Entered By: Christin Fudge on 09/28/2016 09:01:42 Adam Merritt ([id number]) Adam Merritt, Adam Merritt ([id number]) -------------------------------------------------------------------------------- Physician Orders Details Patient Name: Adam Merritt Date of Service: 09/28/2016 8:00 AM Medical Record Number: [id number] Patient Account Number: [id number] Date of Birth/Sex: 05/17/1946 (71 y.o. Male) Treating RN: Montey Hora Primary Care Provider: [name] Other Clinician: Referring Provider: [name] Treating Provider/Extender: Frann Rider in Treatment: 0 Verbal / Phone Orders: No Diagnosis Coding Wound Cleansing Wound #2 Left,Circumferential Lower Leg o Clean wound with Normal Saline. o Cleanse wound with mild soap and  water Wound #3 Right Lower Leg o Clean wound with Normal Saline. o Cleanse wound with mild soap and water Primary Wound Dressing Wound #2 Left,Circumferential Lower Leg o Aquacel Ag Wound #3 Right Lower Leg o Aquacel Ag Secondary Dressing Wound #2 Left,Circumferential Lower  Leg o ABD pad o XtraSorb Wound #3 Right Lower Leg o ABD pad o XtraSorb Dressing Change Frequency Wound #2 Left,Circumferential Lower Leg o Three times weekly Wound #3 Right Lower Leg o Three times weekly Follow-up Appointments Wound #2 Left,Circumferential Lower Leg o Return Appointment in 1 week. VIVIAN, OKELLEY ([id number]) Wound #3 Right Lower Leg o Return Appointment in 1 week. Edema Control Wound #2 Left,Circumferential Lower Leg o Kerlix and Coban - Bilateral - may anchor with unna paste Wound #3 Right Lower Leg o Kerlix and Coban - Bilateral - may anchor with unna paste Additional Orders / Instructions Wound #2 Left,Circumferential Lower Leg o Increase protein intake. o Other: - please work to keep blood sugars below 180 Wound #3 Right Lower Leg o Increase protein intake. o Other: - please work to keep blood sugars below [place] #2 Left,Circumferential Lower Leg o [place] Visits - [place] Nurse may visit PRN to address patientos wound care needs. o FACE TO FACE ENCOUNTER: MEDICARE and MEDICAID PATIENTS: I certify that this patient is under my care and that I had a face-to-face encounter that meets the physician face-to-face encounter requirements with this patient on this date. The encounter with the patient was in whole or in part for the following MEDICAL CONDITION: (primary reason for [place]) MEDICAL NECESSITY: I certify, that based on my findings, NURSING services are a medically necessary home health service. HOME BOUND STATUS: I certify that my clinical findings support that this patient is  homebound (i.e., Due to illness or injury, pt requires aid of supportive devices such as crutches, cane, wheelchairs, walkers, the use of special transportation or the assistance of another person to leave their place of residence. There is a normal inability to leave the home and doing so requires considerable and taxing effort. Other absences are for medical reasons / religious services and are infrequent or of short duration when for other reasons). o If current dressing causes regression in wound condition, may D/C ordered dressing product/s and apply Normal Saline Moist Dressing daily until next [place] / Other MD appointment. [place] of regression in wound condition at [phone]. o Please direct any NON-WOUND related issues/requests for orders to patient's Primary Care Physician Wound #3 Right Lower Leg o [place] Visits - [place] Nurse may visit PRN to address patientos wound care needs. o FACE TO FACE ENCOUNTER: MEDICARE and MEDICAID PATIENTS: I certify that this patient is under my care and that I had a face-to-face encounter that meets the physician face-to-face encounter requirements with this patient on this date. The encounter with the patient was in [place]. ([id number]) whole or in part for the following MEDICAL CONDITION: (primary reason for [place]) MEDICAL NECESSITY: I certify, that based on my findings, NURSING services are a medically necessary home health service. HOME BOUND STATUS: I certify that my clinical findings support that this patient is homebound (i.e., Due to illness or injury, pt requires aid of supportive devices such as crutches, cane, wheelchairs, walkers, the use of special transportation or the assistance of another person to leave their place of residence. There is a normal inability to leave the home and doing so requires considerable and taxing effort.  Other absences are for medical reasons / religious services and are infrequent or of short duration when for other reasons). o If current dressing causes regression in wound condition, may D/C ordered dressing product/s  and apply Normal Saline Moist Dressing daily until next [place] / Other MD appointment. [place] of regression in wound condition at [phone]. o Please direct any NON-WOUND related issues/requests for orders to patient's Primary Care Physician Electronic Signature(s) Signed: 09/28/2016 4:50:03 PM By: Christin Fudge MD, FACS Signed: 09/28/2016 5:14:16 PM By: Montey Hora Entered By: Montey Hora on 09/28/2016 08:56:54 Adam Merritt ([id number]) -------------------------------------------------------------------------------- Problem List Details Patient Name: Adam Merritt Date of Service: 09/28/2016 8:00 AM Medical Record Number: [id number] Patient Account Number: [id number] Date of Birth/Sex: Feb 27, 1946 (71 y.o. 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 Male) Treating RN: Montey Hora Primary Care Provider: [name] Other Clinician: Referring Provider: [name] Treating Provider/Extender: Frann Rider in Treatment: 0 Subjective Chief Complaint Information obtained from Patient the patient comes along with his wife for excessive swelling of both lower extremities left more than right and weeping of fluid for over 2 months now History of Present Illness (HPI) The following HPI elements were documented for the patient's wound: Location: bilateral lower leg edema with some blisters recently and weeping of fluid left side more than right Quality: Patient reports experiencing a dull pain to affected area(s). Severity: Patient states wound are getting worse. Duration: Patient has had the wound for > 3 months prior to seeking treatment at the wound center Timing: Pain in wound is Intermittent (comes and goes Context: The wound appeared gradually over time Modifying Factors: Consults to this date include: he has been seen by his PCP and she sent him off to see [place]. Associated Signs and Symptoms: Patient reports having difficulty standing for long periods. 71 year old gentleman seen earlier at our clinic in June 2016 and again in June 2017 is noncompliant with recommendations and does not follow-up regularly. 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 He had referred him to the lymphedema clinic and he was accepted but he did not follow-up with them. At that stage he had agreed to get a repeat arterial study done and he has seen Dr. Trula Slade  in [place]. This time around he has been seen by his PCP Dr. Tiburcio Bash who referred him urgently to [place] for bilateral lower extremity cellulitis in spite of being on antibiotics -- he was placed on Keflex and 500 mg 3 times a day for 10 days. His comorbidities include coronary artery disease, diabetes mellitus, hypertension, Bell's palsy and chronic lymphedema. he was also recently admitted to the hospital on April  13 and kept overnight with the diagnosis of a TIA and a left ICA stenosis to follow-up with vascular surgery as an outpatient ==== Old Notes: 71 year old gentleman who comes with bilateral lower limb edema for over 20 years and recent attack of blisters and redness for about 2 weeks. The patient says he has had bilateral lower limb edema since 1996. He has been noncompliant with wearing wraps in the past and even was given a boot to wear at night which she never tolerated and didn't Adam Merritt, Adam Merritt. ([id number]) wear it. He has a past medical history of sleep apnea, neuropathy with diabetes, hypertension, hyperlipidemia, coronary artery disease, varicose veins, carotid artery disease and is status post CABG and cholecystectomy. He was seen by the vascular surgeon on 09/29/2013 and Dr. Trula Slade opinion was:"I have reviewed his outside ultrasound which shows an ankle-brachial index of 0.72 on the left and 1.0 on the right. This study was repeated at our office. The ankle-brachial index on the right was 1.26 with triphasic waveforms. On the left it was a 1.04 with biphasic waveforms. The patient had a toe pressure of 121 on the right and 100 on the left. In summary, I would not recommend any further imaging , as I do not think his symptoms are related to arterial insufficiency, but rather secondary to diabetic neuropathy. The patient still has room for increasing his Neurontin dose, which he states does help him. I have scheduled him to come back in one year for a repeat arterial  evaluation." in June 2016, a year ago, he has a venous duplex study to be done. He was also going to be accepted to the lymphedema clinic but I did not believe the patient went regularly for this. ==== Wound History Patient presents with 2 open wounds that have been present for approximately 2 months. Patient has been treating wounds in the following manner: open to air. Laboratory tests have not been performed in the last month. Patient reportedly has not tested positive for an antibiotic resistant organism. Patient reportedly has not tested positive for osteomyelitis. Patient reportedly has had testing performed to evaluate circulation in the legs. Patient experiences the following problems associated with their wounds: swelling. Patient History Information obtained from Patient. Allergies Corticosteroids (Glucocorticoids) Family History Cancer - Paternal Grandparents, Kidney Disease - Siblings, Lung Disease - Mother, No family history of Diabetes, Heart Disease, Hereditary Spherocytosis, Hypertension, Seizures, Stroke, Thyroid Problems, Tuberculosis. Social History Never smoker, Marital Status - Married, Alcohol Use - Never, Drug Use - No History, Caffeine Use - Never. Medical History Hospitalization/Surgery History - 11/03/2013, [facility], bronchitis. Medical And Surgical History Notes Cardiovascular varicose veins Neurologic CVA - 1992 Review of Systems (ROS) Adam Merritt, Adam Merritt. ([id number]) Genitourinary Complains or has symptoms of Kidney failure/ Dialysis - CKD stage 3. Medications carvedilol 25 mg tablet oral 1 1 tablet oral two times daily Duragesic 25 mcg/hr transdermal patch transdermal 1 1 patch 72 hour transdermal oxycodone 10 mg tablet oral one half to one tablet oral three times daily as needed Victoza 2-Pak 0.6 mg/0.1 mL (18 mg/3 mL) subcutaneous pen injector subcutaneous pen injector subcutaneous as directed hydralazine 25 mg tablet oral 1 1 tablet oral three times  daily ProAir HFA 90 mcg/actuation aerosol inhaler inhalation 2 2 HFA aerosol inhaler inhalations every four to six hours as needed ipratropium-albuterol 0.5 mg-3 mg(2.5 mg base)/3 mL nebulization soln inhalation 1 1 solution for nebulization inhalation four times daily as needed pentoxifylline ER 400 mg tablet,extended release oral 1 1 tablet  extended release oral three times daily Basaglar KwikPen U-100 Insulin 100 unit/mL (3 mL) subcutaneous subcutaneous insulin pen subcutaneous inject 10 units at bedtime Humalog KwikPen (U-100) Insulin 100 unit/mL subcutaneous subcutaneous insulin pen subcutaneous sliding scale Dulcolax (bisacodyl) 5 mg tablet,delayed release oral 2 2 tablets,delayed release (DR/EC) oral daily as needed multivitamin tablet oral 1 1 tablet oral daily ibuprofen 600 mg tablet oral 1 1 tablet oral two times daily as needed potassium chloride ER 10 mEq tablet,extended release oral 1 1 tablet extended release oral daily as needed omeprazole 20 mg capsule,delayed release oral 1 1 capsule,delayed release(DR/EC) oral daily zolpidem ER 12.5 mg tablet,extended release,multiphase oral 1 1 tablet,ext release multiphase oral nightly as needed metolazone 2.5 mg tablet oral 1 1 tablet oral daily urea 20 % topical cream topical cream topical as directed three times weekly hydrocortisone 2.5 % topical cream topical cream topical apply three times weekly Objective Constitutional Pulse regular. Respirations normal and unlabored. Afebrile. Vitals Time Taken: 8:20 AM, Height: 65 in, Source: Measured, Weight: 336 lbs, Source: Measured, BMI: 55.9, Temperature: 97.5 F, Pulse: 69 bpm, Respiratory Rate: 20 breaths/min, Blood Pressure: 158/83 mmHg. Adam Merritt, Adam Merritt ([id number]) Eyes Nonicteric. Reactive to light. Ears, Nose, Mouth, and Throat Lips, teeth, and gums WNL.Marland Kitchen Moist mucosa without lesions. Neck supple and nontender. No palpable supraclavicular or cervical adenopathy. Normal sized  without goiter. Respiratory WNL. No retractions.. Cardiovascular Pedal Pulses WNL. ABI was noncompressible bilaterally. stage III lymphedema left side worse than right side. Chest Breasts symmetical and no nipple discharge.. Breast tissue WNL, no masses, lumps, or tenderness.. Gastrointestinal (GI) Abdomen without masses or tenderness.. No liver or spleen enlargement or tenderness.. Lymphatic No adneopathy. No adenopathy. No adenopathy. Musculoskeletal Adexa without tenderness or enlargement.. Digits and nails w/o clubbing, cyanosis, infection, petechiae, ischemia, or inflammatory conditions.Marland Kitchen Psychiatric Judgement and insight Intact.. No evidence of depression, anxiety, or agitation.. General Notes: the patient has massive bilateral lymphedema left worse than right and the left side has breakdown of skin with micro-ulcerations and oozing with some evidence of deeper breakdown of skin. Right side as was a very tiny ulcer which is limited to breakdown of skin. Integumentary (Hair, Skin) No suspicious lesions. No crepitus or fluctuance. No peri-wound warmth or erythema. No masses.. Wound #2 status is Open. Original cause of wound was Gradually Appeared. The wound is located on the Left,Circumferential Lower Leg. The wound measures 12cm length x 34cm width x 0.1cm depth; 320.442cm^2 area and 32.044cm^3 volume. The wound is limited to skin breakdown. There is no tunneling or undermining noted. There is a large amount of serous drainage noted. The wound margin is flat and intact. There is medium (34-66%) pink granulation within the wound bed. There is a medium (34-66%) amount of necrotic tissue within the wound bed including Adherent Slough. The periwound skin appearance did not exhibit: Callus, Crepitus, Excoriation, Induration, Rash, Scarring, Dry/Scaly, Maceration, Atrophie Blanche, Cyanosis, Ecchymosis, Hemosiderin Staining, Mottled, Pallor, Rubor, Erythema. Periwound temperature was  noted as No Abnormality. Wound #3 status is Open. Original cause of wound was Gradually Appeared. The wound is located on the Adam Merritt, Adam Merritt. ([id number]) Right Lower Leg. The wound measures 3cm length x 2cm width x 0.1cm depth; 4.712cm^2 area and 0.471cm^3 volume. The wound is limited to skin breakdown. There is no tunneling or undermining noted. There is a large amount of serous drainage noted. The wound margin is flat and intact. There is large (67- 100%) red granulation within the wound bed. There is no necrotic tissue within the wound  bed. The periwound skin appearance did not exhibit: Callus, Crepitus, Excoriation, Induration, Rash, Scarring, Dry/Scaly, Maceration, Atrophie Blanche, Cyanosis, Ecchymosis, Hemosiderin Staining, Mottled, Pallor, Rubor, Erythema. Periwound temperature was noted as No Abnormality. 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 is rather noncompliant and refuses to have compression stockings applied and work with the lymphedema clinic. 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 He had referred him to the lymphedema clinic and he was accepted but he did not follow-up with them. after review I have recommended: 1. Silver alginate and a light Kerlix and Coban dressing both lower extremities. 2. Continue with his antibiotics  as prescribed by his PCP 3. Good control of his diabetes mellitus 4. Elevation and exercise. 5. Regular visits to the wound center. Once this skin breakdown is healed we will refer him back to the lymphedema clinic and I have urged him and his wife to be compliant with our recommendations I was asked to come back and see me at regular intervals and he is agreeable about this. Plan Adam Merritt, Adam Merritt ([id number]) Wound Cleansing: Wound #2 Left,Circumferential Lower Leg: Clean wound with Normal Saline. Cleanse wound with mild soap and water Wound #3 Right Lower Leg: Clean wound with Normal Saline. Cleanse wound with mild soap and water Primary Wound Dressing: Wound #2 Left,Circumferential Lower Leg: Aquacel Ag Wound #3 Right Lower Leg: Aquacel Ag Secondary Dressing: Wound #2 Left,Circumferential Lower Leg: ABD pad XtraSorb Wound #3 Right Lower Leg: ABD pad XtraSorb Dressing Change Frequency: Wound #2 Left,Circumferential Lower Leg: Three times weekly Wound #3 Right Lower Leg: Three times weekly Follow-up Appointments: Wound #2 Left,Circumferential Lower Leg: Return Appointment in 1 week. Wound #3 Right Lower Leg: Return Appointment in 1 week. Edema Control: Wound #2 Left,Circumferential Lower Leg: Kerlix and Coban - Bilateral - may anchor with unna paste Wound #3 Right Lower Leg: Kerlix and Coban - Bilateral - may anchor with unna paste Additional Orders / Instructions: Wound #2 Left,Circumferential Lower Leg: Increase protein intake. Other: - please work to keep blood sugars below 180 Wound #3 Right Lower Leg: Increase protein intake. Other: - please work to keep blood sugars below [place]: Wound #2 Left,Circumferential Lower Leg: Continue Home Health Visits - [place] Nurse may visit PRN to address patient s wound care needs. FACE TO FACE ENCOUNTER: MEDICARE and MEDICAID PATIENTS: I certify that this patient is under my care and that I had a  face-to-face encounter that meets the physician face-to-face encounter requirements with this patient on this date. The encounter with the patient was in whole or in part for the following MEDICAL CONDITION: (primary reason for [place]) MEDICAL NECESSITY: I certify, that based on my findings, NURSING services are a medically necessary home health service. [place] ([id number]) BOUND STATUS: I certify that my clinical findings support that this patient is homebound (i.e., Due to illness  or injury, pt requires aid of supportive devices such as crutches, cane, wheelchairs, walkers, the use of special transportation or the assistance of another person to leave their place of residence. There is a normal inability to leave the home and doing so requires considerable and taxing effort. Other absences are for medical reasons / religious services and are infrequent or of short duration when for other reasons). If current dressing causes regression in wound condition, may D/C ordered dressing product/s and apply Normal Saline Moist Dressing daily until next [place] / Other MD appointment. [place] of regression in wound condition at [phone]. Please direct any NON-WOUND related issues/requests for orders to patient's Primary Care Physician Wound #3 Right Lower Leg: [place] Visits - [place] Nurse may visit PRN to address patient s wound care needs. FACE TO FACE ENCOUNTER: MEDICARE and MEDICAID PATIENTS: I certify that this patient is under my care and that I had a face-to-face encounter that meets the physician face-to-face encounter requirements with this patient on this date. The encounter with the patient was in whole or in part for the following MEDICAL CONDITION: (primary reason for [place]) MEDICAL NECESSITY: I certify, that based on my findings, NURSING services are a medically necessary home health service.  HOME BOUND STATUS: I certify that my clinical findings support that this patient is homebound (i.e., Due to illness or injury, pt requires aid of supportive devices such as crutches, cane, wheelchairs, walkers, the use of special transportation or the assistance of another person to leave their place of residence. There is a normal inability to leave the home and doing so requires considerable and taxing effort. Other absences are for medical reasons / religious services and are infrequent or of short duration when for other reasons). If current dressing causes regression in wound condition, may D/C ordered dressing product/s and apply Normal Saline Moist Dressing daily until next [place] / Other MD appointment. [place] of regression in wound condition at [phone]. Please direct any NON-WOUND related issues/requests for orders to patient's Primary Care Physician 71 year old gentleman has been worked up in the past both arterial and for possible DVTs of his legs,was seen by me last year. is rather noncompliant and refuses to have compression stockings applied and work with the lymphedema clinic. 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 He had referred him to the lymphedema clinic and he was accepted but he did not follow-up with them. after review I have recommended: 1. Silver alginate and a light Kerlix and Coban dressing both lower extremities. 2. Continue with his antibiotics as prescribed by his PCP 3. Good control of his diabetes mellitus 4. Elevation and exercise. 5. Regular visits to the wound center. Once this skin breakdown is healed we will refer him back to the lymphedema clinic and I have urged him and his wife to be compliant with our  recommendations Adam Merritt, Adam Merritt ([id number]) I was asked to come back and see me at regular intervals and he is agreeable about this. Electronic Signature(s) Signed: 09/28/2016 12:22:12 PM By: Christin Fudge MD, FACS Previous Signature: 09/28/2016 9:05:23 AM Version By: Christin Fudge MD, FACS Entered By: Christin Fudge on 09/28/2016 12:22:12 Adam Merritt, Adam Merritt ([id number]) -------------------------------------------------------------------------------- ROS/PFSH Details Patient Name:  Adam Merritt Date of Service: 09/28/2016 8:00 AM Medical Record Number: [id number] Patient Account Number: [id number] Date of Birth/Sex: 12/14/1945 (71 y.o. Male) Treating RN: Montey Hora Primary Care Provider: [name] Other Clinician: Referring Provider: [name] Treating Provider/Extender: Frann Rider in Treatment: 0 Information Obtained From Patient Wound History Do you currently have one or more open woundso Yes How many open wounds do you currently haveo 2 Approximately how long have you had your woundso 2 months How have you been treating your wound(s) until nowo open to air Has your wound(s) ever healed and then re-openedo No Have you had any lab work done in the past montho No Have you tested positive for an antibiotic resistant organism (MRSA, VRE)o No Have you tested positive for osteomyelitis (bone infection)o No Have you had any tests for circulation on your legso Yes Who ordered the testo PCP Where was the test doneo [facility] Have you had other problems associated with your woundso Swelling Genitourinary Complaints and Symptoms: Positive for: Kidney failure/ Dialysis - CKD stage 3 Medical History: Negative for: End Stage Renal Disease Eyes Medical History: Negative for: Cataracts; Glaucoma; Optic Neuritis Ear/Nose/Mouth/Throat Medical History: Negative for: Chronic sinus problems/congestion; Middle ear problems Hematologic/Lymphatic Medical History: Positive for: Anemia -  aplastic anemia; Lymphedema Negative for: Hemophilia; Human Immunodeficiency Virus; Sickle Cell Disease Respiratory Adam Merritt, Adam Merritt ([id number]) Medical History: Positive for: Chronic Obstructive Pulmonary Disease (COPD); Sleep Apnea Negative for: Aspiration; Asthma; Pneumothorax; Tuberculosis Cardiovascular Medical History: Positive for: Congestive Heart Failure; Coronary Artery Disease; Hypertension; Peripheral Venous Disease Negative for: Angina; Arrhythmia; Deep Vein Thrombosis; Hypotension; Myocardial Infarction; Peripheral Arterial Disease; Phlebitis; Vasculitis Past Medical History Notes: varicose veins Gastrointestinal Medical History: Negative for: Cirrhosis ; Colitis; Crohnos; Hepatitis A; Hepatitis B; Hepatitis C Endocrine Medical History: Positive for: Type II Diabetes Negative for: Type I Diabetes Time with diabetes: since 2006 Treated with: Insulin Blood sugar tested every day: Yes Tested : Blood sugar testing results: Breakfast: 209 Immunological Medical History: Negative for: Lupus Erythematosus; Raynaudos; Scleroderma Integumentary (Skin) Medical History: Negative for: History of Burn; History of pressure wounds Musculoskeletal Medical History: Positive for: Osteoarthritis Negative for: Gout; Rheumatoid Arthritis; Osteomyelitis Neurologic Medical History: Positive for: Neuropathy Negative for: Dementia; Quadriplegia; Paraplegia; Seizure Disorder Adam Merritt, KISTLER ([id number]) Past Medical History Notes: CVA - 1992 Oncologic Medical History: Positive for: Received Chemotherapy - last dose 2006 Negative for: Received Radiation Psychiatric Medical History: Negative for: Anorexia/bulimia; Confinement Anxiety Immunizations Pneumococcal Vaccine: Received Pneumococcal Vaccination: Yes Hospitalization / Surgery History Name of Hospital Purpose of Hospitalization/Surgery Date [facility] bronchitis 11/03/2013 Family and Social History Cancer: Yes - Paternal  Grandparents; Diabetes: No; Heart Disease: No; Hereditary Spherocytosis: No; Hypertension: No; Kidney Disease: Yes - Siblings; Lung Disease: Yes - Mother; Seizures: No; Stroke: No; Thyroid Problems: No; Tuberculosis: No; Never smoker; Marital Status - Married; Alcohol Use: Never; Drug Use: No History; Caffeine Use: Never; Financial Concerns: No; Food, Clothing or Shelter Needs: No; Support System Lacking: No; Transportation Concerns: No; Advanced Directives: No; Patient does not want information on Advanced Directives Physician Affirmation I have reviewed and agree with the above information. Electronic Signature(s) Signed: 09/28/2016 4:50:03 PM By: Christin Fudge MD, FACS Signed: 09/28/2016 5:14:16 PM By: Montey Hora Entered By: Christin Fudge on 09/28/2016 08:49:34 CLANCY, MULLARKEY ([id number]) -------------------------------------------------------------------------------- SuperBill Details Patient Name: Adam Merritt Date of Service: 09/28/2016 Medical Record Number: [id number] Patient Account Number: [id number] Date of Birth/Sex: 1946/03/24 (71 y.o. 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 Facility Procedures CPT4 Code: [id number] Description: 94854 - WOUND CARE VISIT-LEV 5 EST PT Modifier: Quantity: 1 Physician Procedures CPT4 Code Description: [id number] - WC PHYS LEVEL 4 - EST PT ICD-10 Description Diagnosis 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

## 2016-09-29 NOTE — Progress Notes (Signed)
ICARUS, PARTCH ([id number]) Visit Report for 09/28/2016 Allergy List Details Patient Name: Adam Merritt, Adam Merritt Date of Service: 09/28/2016 8:00 AM Medical Record Number: [id number] Patient Account Number: [id number] Date of Birth/Sex: November 11, 1945 (71 y.o. Male) Treating RN: Montey Hora Primary Care Danarius Mcconathy: [name] Other Clinician: Referring Forestine Macho: [name] Treating Shiasia Porro/Extender: Frann Rider in Treatment: 0 Allergies Active Allergies Corticosteroids (Glucocorticoids) Allergy Notes Electronic Signature(s) Signed: 09/28/2016 5:14:16 PM By: Montey Hora Entered By: Montey Hora on 09/28/2016 08:21:52 Adam Merritt ([id number]) -------------------------------------------------------------------------------- [place] Details Patient Name: Adam Merritt Date of Service: 09/28/2016 8:00 AM Medical Record Number: [id number] Patient Account Number: [id number] Date of Birth/Sex: March 19, 1946 (71 y.o. Male) Treating RN: Montey Hora Primary Care Clessie Karras: [name] Other Clinician: Referring Duron Meister: [name] Treating Phala Schraeder/Extender: Frann Rider in Treatment: 0 Visit Information Patient Arrived: [name] Arrival Time: 08:18 Accompanied By: spouse Transfer Assistance: None Patient Identification Verified: Yes Secondary Verification Process Yes Completed: Patient Has Alerts: Yes Patient Alerts: DMII ABI [place] BILATERAL >220 History Since Last Visit Added or deleted any medications: No Any new allergies or adverse reactions: No Had a fall or experienced change in activities of daily living that may affect risk of falls: No Signs or symptoms of abuse/neglect since last visito No Hospitalized since last visit: No Electronic Signature(s) Signed: 09/28/2016 5:14:16 PM By: Montey Hora Entered By: Montey Hora on 09/28/2016 08:44:28 Adam Merritt  ([id number]) -------------------------------------------------------------------------------- Clinic Level of Care Assessment Details Patient Name: Adam Merritt Date of Service: 09/28/2016 8:00 AM Medical Record Number: [id number] Patient Account Number: [id number] Date of Birth/Sex: [birth year] (71 y.o. Male) Treating RN: Montey Hora Primary Care Doninique Lwin: [name] Other Clinician: Referring Katricia Prehn: [name] Treating Jereld Presti/Extender: Frann Rider in Treatment: 0 Clinic Level of Care Assessment Items TOOL 2 Quantity Score []  - Use when only an EandM is performed on the INITIAL visit 0 ASSESSMENTS - Nursing Assessment / Reassessment X - General Physical Exam (combine w/ comprehensive assessment (listed just 1 20 below) when performed on new pt. evals) X - Comprehensive Assessment (HX, ROS, Risk Assessments, Wounds Hx, etc.) 1 25 ASSESSMENTS - Wound and Skin Assessment / Reassessment []  - Simple Wound Assessment / Reassessment - one wound 0 X - Complex Wound Assessment / Reassessment - multiple wounds 2 5 []  - Dermatologic / Skin Assessment (not related to wound area) 0 ASSESSMENTS - Ostomy and/or Continence Assessment and Care []  - Incontinence Assessment and Management 0 []  - Ostomy Care Assessment and Management (repouching, etc.) 0 PROCESS - Coordination of Care X - Simple Patient / Family Education for ongoing care 1 15 []  - Complex (extensive) Patient / Family Education for ongoing care 0 []  - Staff obtains Programmer, systems, Records, Test Results / Process Orders 0 []  - Staff telephones HHA, Nursing Homes / Clarify orders / etc 0 []  - Routine Transfer to another Facility (non-emergent condition) 0 []  - Routine Hospital Admission (non-emergent condition) 0 X - New Admissions / Biomedical engineer / Ordering NPWT, Apligraf, etc. 1 15 []  - Emergency Hospital Admission (emergent condition) 0 X - Simple Discharge Coordination 1 10 Adam Merritt, PTACEK. ([id number]) []  -  Complex (extensive) Discharge Coordination 0 PROCESS - Special Needs []  - Pediatric / Minor Patient Management 0 []  - Isolation Patient Management 0 []  - Hearing / Language / Visual special needs 0 []  - Assessment of Community assistance (transportation, D/C planning, etc.) 0 []  - Additional assistance / Altered mentation 0 []  - Support Surface(s) Assessment (bed, cushion, seat, etc.)  0 INTERVENTIONS - Wound Cleansing / Measurement X - Wound Imaging (photographs - any number of wounds) 1 5 []  - Wound Tracing (instead of photographs) 0 []  - Simple Wound Measurement - one wound 0 X - Complex Wound Measurement - multiple wounds 2 5 []  - Simple Wound Cleansing - one wound 0 X - Complex Wound Cleansing - multiple wounds 2 5 INTERVENTIONS - Wound Dressings []  - Small Wound Dressing one or multiple wounds 0 []  - Medium Wound Dressing one or multiple wounds 0 X - Large Wound Dressing one or multiple wounds 2 20 []  - Application of Medications - injection 0 INTERVENTIONS - Miscellaneous []  - External ear exam 0 []  - Specimen Collection (cultures, biopsies, blood, body fluids, etc.) 0 []  - Specimen(s) / Culture(s) sent or taken to Lab for analysis 0 []  - Patient Transfer (multiple staff / Harrel Lemon Lift / Similar devices) 0 []  - Simple Staple / Suture removal (25 or less) 0 []  - Complex Staple / Suture removal (26 or more) 0 Adam Merritt, BIEL. ([id number]) []  - Hypo / Hyperglycemic Management (close monitor of Blood Glucose) 0 X - Ankle / Brachial Index (ABI) - do not check if billed separately 1 15 Has the patient been seen at the hospital within the last three years: Yes Total Score: 175 Level Of Care: New/Established - Level 5 Electronic Signature(s) Signed: 09/28/2016 5:14:16 PM By: Montey Hora Entered By: Montey Hora on 09/28/2016 10:08:12 Adam Merritt ([id number]) -------------------------------------------------------------------------------- Encounter Discharge Information  Details Patient Name: Adam Merritt Date of Service: 09/28/2016 8:00 AM Medical Record Number: [id number] Patient Account Number: [id number] Date of Birth/Sex: Sep 03, 1945 (71 y.o. Male) Treating RN: Montey Hora Primary Care Kiffany Schelling: [name] Other Clinician: Referring Debria Broecker: [name] Treating Shabnam Ladd/Extender: Frann Rider in Treatment: 0 Encounter Discharge Information Items Discharge Pain Level: 0 Discharge Condition: Stable Ambulatory Status: Cane Discharge Destination: Home Transportation: Private Auto Accompanied By: spouse Schedule Follow-up Appointment: Yes Medication Reconciliation completed No and provided to Patient/Care Beckett Hickmon: Provided on Clinical Summary of Care: 09/28/2016 Form Type Recipient Paper Patient JT Electronic Signature(s) Signed: 09/28/2016 9:16:09 AM By: Ruthine Dose Entered By: Ruthine Dose on 09/28/2016 09:16:09 Adam Merritt ([id number]) -------------------------------------------------------------------------------- General Visit Notes Details Patient Name: Adam Merritt Date of Service: 09/28/2016 8:00 AM Medical Record Number: [id number] Patient Account Number: [id number] Date of Birth/Sex: [birth year] (71 y.o. Male) Treating RN: Montey Hora Primary Care Shenelle Klas: [name] Other Clinician: Referring Paytyn Mesta: [name] Treating Lyriq Jarchow/Extender: Frann Rider in Treatment: 0 Notes CBG log given to patient and patient educated to record CBGs Electronic Signature(s) Signed: 09/28/2016 5:04:49 PM By: Montey Hora Entered By: Montey Hora on 09/28/2016 17:04:49 Adam Merritt ([id number]) -------------------------------------------------------------------------------- Lower Extremity Assessment Details Patient Name: Adam Merritt Date of Service: 09/28/2016 8:00 AM Medical Record Number: [id number] Patient Account Number: [id number] Date of Birth/Sex: [birth year] (71 y.o. Male) Treating RN: Montey Hora Primary Care Dianne Bady: [name] Other Clinician: Referring Christinea Brizuela: [name] Treating Donne Robillard/Extender: Frann Rider in Treatment: 0 Edema Assessment Assessed: [Left: No] [Right: No] Edema: [Left: Yes] [Right: Yes] Calf Left: Right: Point of Measurement: 34 cm From Medial Instep 59.7 cm 69.5 cm Ankle Left: Right: Point of Measurement: 12 cm From Medial Instep 41 cm 33.4 cm Vascular Assessment Pulses: Dorsalis Pedis Palpable: [Left:Yes] [Right:Yes] Doppler Audible: [Left:Yes] [Right:Yes] Posterior Tibial Palpable: [Left:No] [Right:No] Doppler Audible: [Left:Yes] [Right:Yes] Extremity colors, hair growth, and conditions: Extremity Color: [Left:Hyperpigmented] [Right:Hyperpigmented] Hair Growth on Extremity: [Left:No] [Right:No] Temperature of Extremity: [Left:Warm] [  Right:Warm] Capillary Refill: [Left:< 3 seconds] [Right:< 3 seconds] Toe Nail Assessment Left: Right: Thick: Yes Yes Discolored: Yes Yes Deformed: No No Improper Length and Hygiene: No No Notes ABI [place] BILATERAL >220 Adam Merritt, Adam Merritt ([id number]) Electronic Signature(s) Signed: 09/28/2016 5:14:16 PM By: Montey Hora Entered By: Montey Hora on 09/28/2016 08:44:11 Adam Merritt ([id number]) -------------------------------------------------------------------------------- Multi Wound Chart Details Patient Name: Adam Merritt Date of Service: 09/28/2016 8:00 AM Medical Record Number: [id number] Patient Account Number: [id number] Date of Birth/Sex: [birth year] (71 y.o. Male) Treating RN: Montey Hora Primary Care Jasier Calabretta: [name] Other Clinician: Referring Leslie Jester: [name] Treating Taeden Geller/Extender: Frann Rider in Treatment: 0 Vital Signs Height(in): 65 Pulse(bpm): 69 Weight(lbs): 336 Blood Pressure 158/83 (mmHg): Body Mass Index(BMI): 56 Temperature(F): 97.5 Respiratory Rate 20 (breaths/min): Photos: [2:No Photos] [3:No Photos] [N/A:N/A] Wound Location:  [2:Left Lower Leg - Circumfernential] [3:Right Lower Leg] [N/A:N/A] Wounding Event: [2:Gradually Appeared] [3:Gradually Appeared] [N/A:N/A] Primary Etiology: [2:Lymphedema] [3:Lymphedema] [N/A:N/A] Comorbid History: [2:Anemia, Lymphedema, Chronic Obstructive Pulmonary Disease (COPD), Sleep Apnea, Congestive Heart Failure, Coronary Artery Disease, Hypertension, Peripheral Venous Disease, Type II Diabetes, Osteoarthritis, Neuropathy, Received  Chemotherapy] [3:Anemia, Lymphedema, Chronic Obstructive Pulmonary Disease (COPD), Sleep Apnea, Congestive Heart Failure, Coronary Artery Disease, Hypertension, Peripheral Venous Disease, Type II Diabetes, Osteoarthritis, Neuropathy, Received  Chemotherapy] [N/A:N/A] Date Acquired: [2:07/10/2016] [3:07/10/2016] [N/A:N/A] Weeks of Treatment: [2:0] [3:0] [N/A:N/A] Wound Status: [2:Open] [3:Open] [N/A:N/A] Measurements L x W x D 12x34x0.1 [3:3x2x0.1] [N/A:N/A] (cm) Area (cm) : [2:320.442] [3:4.712] [N/A:N/A] Volume (cm) : [2:32.044] [3:0.471] [N/A:N/A] Classification: [2:Partial Thickness] [3:Partial Thickness] [N/A:N/A] HBO Classification: [2:Grade 1] [3:Grade 1] [N/A:N/A] Exudate Amount: [2:Large] [3:Large] [N/A:N/A] Exudate Type: [2:Serous] [3:Serous] [N/A:N/A] Exudate Color: [2:amber] [3:amber] [N/A:N/A] Wound Margin: [2:Flat and Intact] [3:Flat and Intact] [N/A:N/A] Granulation Amount: [2:Medium (34-66%)] [3:Large (67-100%)] [N/A:N/A] Granulation Quality: Pink Red N/A Necrotic Amount: Medium (34-66%) None Present (0%) N/A Exposed Structures: Fascia: No Fascia: No N/A Fat Layer (Subcutaneous Fat Layer (Subcutaneous Tissue) Exposed: No Tissue) Exposed: No Tendon: No Tendon: No Muscle: No Muscle: No Joint: No Joint: No Bone: No Bone: No Limited to Skin Limited to Skin Breakdown Breakdown Epithelialization: Small (1-33%) None N/A Periwound Skin Texture: Excoriation: No Excoriation: No N/A Induration: No Induration: No Callus: No Callus:  No Crepitus: No Crepitus: No Rash: No Rash: No Scarring: No Scarring: No Periwound Skin Maceration: No Maceration: No N/A Moisture: Dry/Scaly: No Dry/Scaly: No Periwound Skin Color: Atrophie Blanche: No Atrophie Blanche: No N/A Cyanosis: No Cyanosis: No Ecchymosis: No Ecchymosis: No Erythema: No Erythema: No Hemosiderin Staining: No Hemosiderin Staining: No Mottled: No Mottled: No Pallor: No Pallor: No Rubor: No Rubor: No Temperature: No Abnormality No Abnormality N/A Tenderness on No No N/A Palpation: Wound Preparation: Ulcer Cleansing: Ulcer Cleansing: N/A Rinsed/Irrigated with Rinsed/Irrigated with Saline Saline Topical Anesthetic Topical Anesthetic Applied: None Applied: None Treatment Notes Electronic Signature(s) Signed: 09/28/2016 8:59:22 AM By: Christin Fudge MD, FACS Entered By: Christin Fudge on 09/28/2016 08:59:21 Adam Merritt ([id number]) -------------------------------------------------------------------------------- [place] Details Patient Name: Adam Merritt Date of Service: 09/28/2016 8:00 AM Medical Record Number: [id number] Patient Account Number: [id number] Date of Birth/Sex: [birth year] (71 y.o. Male) Treating RN: Montey Hora Primary Care Jelani Trueba: [name] Other Clinician: Referring Deseree Zemaitis: [name] Treating Demetruis Depaul/Extender: Frann Rider in Treatment: 0 Active Inactive ` Abuse / Safety / Falls / Self Care Management Nursing Diagnoses: Impaired physical mobility Potential for falls Goals: Patient will remain injury free Date Initiated: 09/28/2016 Target Resolution Date: 12/08/2016 Goal Status: Active Interventions: Assess fall risk on admission and as  needed Notes: ` Orientation to the Wound Care Program Nursing Diagnoses: Knowledge deficit related to the wound healing center program Goals: Patient/caregiver will verbalize understanding of the [place] Program Date Initiated:  09/28/2016 Target Resolution Date: 12/08/2016 Goal Status: Active Interventions: Provide education on orientation to the wound center Notes: ` Wound/Skin Impairment Nursing Diagnoses: Impaired tissue integrity Adam Merritt, Adam Merritt ([id number]) Goals: Patient/caregiver will verbalize understanding of skin care regimen Date Initiated: 09/28/2016 Target Resolution Date: 12/08/2016 Goal Status: Active Ulcer/skin breakdown will have a volume reduction of 30% by week 4 Date Initiated: 09/28/2016 Target Resolution Date: 12/08/2016 Goal Status: Active Ulcer/skin breakdown will have a volume reduction of 50% by week 8 Date Initiated: 09/28/2016 Target Resolution Date: 12/08/2016 Goal Status: Active Ulcer/skin breakdown will have a volume reduction of 80% by week 12 Date Initiated: 09/28/2016 Target Resolution Date: 12/08/2016 Goal Status: Active Ulcer/skin breakdown will heal within 14 weeks Date Initiated: 09/28/2016 Target Resolution Date: 12/09/2016 Goal Status: Active Interventions: Assess patient/caregiver ability to obtain necessary supplies Assess patient/caregiver ability to perform ulcer/skin care regimen upon admission and as needed Assess ulceration(s) every visit Notes: Electronic Signature(s) Signed: 09/28/2016 5:14:16 PM By: Montey Hora Entered By: Montey Hora on 09/28/2016 08:53:22 Adam Merritt ([id number]) -------------------------------------------------------------------------------- Pain Assessment Details Patient Name: Adam Merritt Date of Service: 09/28/2016 8:00 AM Medical Record Number: [id number] Patient Account Number: [id number] Date of Birth/Sex: 1945/09/05 (71 y.o. Male) Treating RN: Montey Hora Primary Care Joselle Deeds: [name] Other Clinician: Referring Osama Coleson: [name] Treating Esmirna Ravan/Extender: Frann Rider in Treatment: 0 Active Problems Location of Pain Severity and Description of Pain Patient Has Paino Yes Site Locations Pain  Location: Pain in Ulcers Duration of the Pain. Constant / Intermittento Constant Pain Management and Medication Current Pain Management: Notes Topical or injectable lidocaine is offered to patient for acute pain when surgical debridement is performed. If needed, Patient is instructed to use over the counter pain medication for the following 24-48 hours after debridement. Wound care MDs do not prescribed pain medications. Patient has chronic pain or uncontrolled pain. Patient has been instructed to make an appointment with their Primary Care Physician for pain management. Electronic Signature(s) Signed: 09/28/2016 5:14:16 PM By: Montey Hora Entered By: Montey Hora on 09/28/2016 08:20:13 Adam Merritt ([id number]) -------------------------------------------------------------------------------- Patient/Caregiver Education Details Patient Name: Adam Merritt Date of Service: 09/28/2016 8:00 AM Medical Record Number: [id number] Patient Account Number: [id number] Date of Birth/Gender: 21-Jun-1945 (71 y.o. Male) Treating RN: Montey Hora Primary Care Physician: [name] Other Clinician: Referring Physician: [name] Treating Physician/Extender: Frann Rider in Treatment: 0 Education Assessment Education Provided To: Patient and Caregiver Education Topics Provided Venous: Handouts: Other: importance of following up with ymphedema clinic Methods: Explain/Verbal Responses: State content correctly Electronic Signature(s) Signed: 09/28/2016 5:14:16 PM By: Montey Hora Entered By: Montey Hora on 09/28/2016 08:54:54 Adam Merritt ([id number]) -------------------------------------------------------------------------------- Wound Assessment Details Patient Name: Adam Merritt Date of Service: 09/28/2016 8:00 AM Medical Record Number: [id number] Patient Account Number: [id number] Date of Birth/Sex: 06-13-1945 (71 y.o. Male) Treating RN: Montey Hora Primary  Care Brealyn Baril: [name] Other Clinician: Referring Hazen Brumett: [name] Treating Kayci [place]/Extender: Frann Rider in Treatment: 0 Wound Status Wound Number: 2 Primary Lymphedema Etiology: Wound Location: Left Lower Leg - Circumfernential Wound Open Status: Wounding Event: Gradually Appeared Comorbid Anemia, Lymphedema, Chronic Date Acquired: 07/10/2016 History: Obstructive Pulmonary Disease (COPD), Weeks Of Treatment: 0 Sleep Apnea, Congestive Heart Failure, Clustered Wound: No Coronary Artery Disease, Hypertension, Peripheral Venous Disease, Type II Diabetes, Osteoarthritis,  Neuropathy, Received Chemotherapy Photos Wound Measurements Length: (cm) 12 Width: (cm) 34 Depth: (cm) 0.1 Area: (cm) 320.442 Volume: (cm) 32.044 % Reduction in Area: 0% % Reduction in Volume: 0% Epithelialization: Small (1-33%) Tunneling: No Undermining: No Wound Description Classification: Partial Thickness Foul Odor After Diabetic Severity Earleen Newport): Grade 1 Slough/Fibrino Wound Margin: Flat and Intact Exudate Amount: Large Exudate Type: Serous Exudate Color: amber Cleansing: No Yes Wound Bed Adam Merritt, Adam Merritt ([id number]) Granulation Amount: Medium (34-66%) Exposed Structure Granulation Quality: Pink Fascia Exposed: No Necrotic Amount: Medium (34-66%) Fat Layer (Subcutaneous Tissue) Exposed: No Necrotic Quality: Adherent Slough Tendon Exposed: No Muscle Exposed: No Joint Exposed: No Bone Exposed: No Limited to Skin Breakdown Periwound Skin Texture Texture Color No Abnormalities Noted: No No Abnormalities Noted: No Callus: No Atrophie Blanche: No Crepitus: No Cyanosis: No Excoriation: No Ecchymosis: No Induration: No Erythema: No Rash: No Hemosiderin Staining: No Scarring: No Mottled: No Pallor: No Moisture Rubor: No No Abnormalities Noted: No Dry / Scaly: No Temperature / Pain Maceration: No Temperature: No Abnormality Wound Preparation Ulcer Cleansing:  Rinsed/Irrigated with Saline Topical Anesthetic Applied: None Treatment Notes Wound #2 (Left, Circumferential Lower Leg) 1. Cleansed with: Clean wound with Normal Saline 4. Dressing Applied: Aquacel Ag Other dressing (specify in notes) 5. Secondary Dressing Applied ABD Pad 7. Secured with Tape Other (specify in notes) Notes xtrasorb, kerlix and coban wrap with unna to anchor Electronic Signature(s) Signed: 09/28/2016 5:14:16 PM By: Montey Hora Entered By: Montey Hora on 09/28/2016 10:14:59 Adam Merritt ([id number]) Adam Merritt, Adam Merritt ([id number]) -------------------------------------------------------------------------------- Wound Assessment Details Patient Name: Adam Merritt Date of Service: 09/28/2016 8:00 AM Medical Record Number: [id number] Patient Account Number: [id number] Date of Birth/Sex: 05/22/1946 (71 y.o. Male) Treating RN: Montey Hora Primary Care Ramiel Forti: [name] Other Clinician: Referring Ayson Cherubini: [name] Treating Murvin Gift/Extender: Frann Rider in Treatment: 0 Wound Status Wound Number: 3 Primary Lymphedema Etiology: Wound Location: Right Lower Leg Wound Open Wounding Event: Gradually Appeared Status: Date Acquired: 07/10/2016 Comorbid Anemia, Lymphedema, Chronic Weeks Of Treatment: 0 History: Obstructive Pulmonary Disease (COPD), Clustered Wound: No Sleep Apnea, Congestive Heart Failure, Coronary Artery Disease, Hypertension, Peripheral Venous Disease, Type II Diabetes, Osteoarthritis, Neuropathy, Received Chemotherapy Photos Photo Uploaded By: Montey Hora on 09/28/2016 10:13:58 Wound Measurements Length: (cm) 3 Width: (cm) 2 Depth: (cm) 0.1 Area: (cm) 4.712 Volume: (cm) 0.471 % Reduction in Area: % Reduction in Volume: Epithelialization: None Tunneling: No Undermining: No Wound Description Classification: Partial Thickness Foul Odor After Diabetic Severity Earleen Newport): Grade 1 Slough/Fibrino Wound Margin:  Flat and Intact Exudate Amount: Large Exudate Type: Serous Exudate Color: amber Adam Merritt, Adam Merritt ([id number]) Cleansing: No No Wound Bed Granulation Amount: Large (67-100%) Exposed Structure Granulation Quality: Red Fascia Exposed: No Necrotic Amount: None Present (0%) Fat Layer (Subcutaneous Tissue) Exposed: No Tendon Exposed: No Muscle Exposed: No Joint Exposed: No Bone Exposed: No Limited to Skin Breakdown Periwound Skin Texture Texture Color No Abnormalities Noted: No No Abnormalities Noted: No Callus: No Atrophie Blanche: No Crepitus: No Cyanosis: No Excoriation: No Ecchymosis: No Induration: No Erythema: No Rash: No Hemosiderin Staining: No Scarring: No Mottled: No Pallor: No Moisture Rubor: No No Abnormalities Noted: No Dry / Scaly: No Temperature / Pain Maceration: No Temperature: No Abnormality Wound Preparation Ulcer Cleansing: Rinsed/Irrigated with Saline Topical Anesthetic Applied: None Treatment Notes Wound #3 (Right Lower Leg) 1. Cleansed with: Clean wound with Normal Saline 4. Dressing Applied: Aquacel Ag Other dressing (specify in notes) 5. Secondary Dressing Applied ABD Pad 7. Secured with Tape Other (specify in  notes) Notes xtrasorb, kerlix and coban wrap with unna to anchor Electronic Signature(s) Signed: 09/28/2016 5:14:16 PM By: Montey Hora Entered By: Montey Hora on 09/28/2016 08:33:32 Adam Merritt, Adam Merritt ([id number]) Adam Merritt, Adam Merritt ([id number]) -------------------------------------------------------------------------------- Vitals Details Patient Name: Adam Merritt Date of Service: 09/28/2016 8:00 AM Medical Record Number: [id number] Patient Account Number: [id number] Date of Birth/Sex: Nov 09, 1945 (71 y.o. Male) Treating RN: Montey Hora Primary Care Reyne Falconi: [name] Other Clinician: Referring Katheryne Gorr: [name] Treating Waino Mounsey/Extender: Frann Rider in Treatment: 0 Vital Signs Time Taken:  08:20 Temperature (F): 97.5 Height (in): 65 Pulse (bpm): 69 Source: Measured Respiratory Rate (breaths/min): 20 Weight (lbs): 336 Blood Pressure (mmHg): 158/83 Source: Measured Reference Range: 80 - 120 mg / dl Body Mass Index (BMI): 55.9 Electronic Signature(s) Signed: 09/28/2016 5:14:16 PM By: Montey Hora Entered By: Montey Hora on 09/28/2016 08:21:43

## 2016-10-05 ENCOUNTER — Encounter: Payer: Medicare Other | Attending: Surgery | Admitting: Surgery

## 2016-10-05 DIAGNOSIS — I2510 Atherosclerotic heart disease of native coronary artery without angina pectoris: Secondary | ICD-10-CM | POA: Insufficient documentation

## 2016-10-05 DIAGNOSIS — L97221 Non-pressure chronic ulcer of left calf limited to breakdown of skin: Secondary | ICD-10-CM | POA: Diagnosis not present

## 2016-10-05 DIAGNOSIS — G510 Bell's palsy: Secondary | ICD-10-CM | POA: Insufficient documentation

## 2016-10-05 DIAGNOSIS — I6522 Occlusion and stenosis of left carotid artery: Secondary | ICD-10-CM | POA: Insufficient documentation

## 2016-10-05 DIAGNOSIS — L97311 Non-pressure chronic ulcer of right ankle limited to breakdown of skin: Secondary | ICD-10-CM | POA: Diagnosis not present

## 2016-10-05 DIAGNOSIS — Z6841 Body Mass Index (BMI) 40.0 and over, adult: Secondary | ICD-10-CM | POA: Insufficient documentation

## 2016-10-05 DIAGNOSIS — E785 Hyperlipidemia, unspecified: Secondary | ICD-10-CM | POA: Diagnosis not present

## 2016-10-05 DIAGNOSIS — E11620 Type 2 diabetes mellitus with diabetic dermatitis: Secondary | ICD-10-CM | POA: Insufficient documentation

## 2016-10-05 DIAGNOSIS — Z951 Presence of aortocoronary bypass graft: Secondary | ICD-10-CM | POA: Diagnosis not present

## 2016-10-05 DIAGNOSIS — Z8673 Personal history of transient ischemic attack (TIA), and cerebral infarction without residual deficits: Secondary | ICD-10-CM | POA: Insufficient documentation

## 2016-10-05 DIAGNOSIS — I509 Heart failure, unspecified: Secondary | ICD-10-CM | POA: Insufficient documentation

## 2016-10-05 DIAGNOSIS — E1151 Type 2 diabetes mellitus with diabetic peripheral angiopathy without gangrene: Secondary | ICD-10-CM | POA: Diagnosis not present

## 2016-10-05 DIAGNOSIS — E1140 Type 2 diabetes mellitus with diabetic neuropathy, unspecified: Secondary | ICD-10-CM | POA: Diagnosis not present

## 2016-10-05 DIAGNOSIS — I890 Lymphedema, not elsewhere classified: Secondary | ICD-10-CM | POA: Insufficient documentation

## 2016-10-05 DIAGNOSIS — I110 Hypertensive heart disease with heart failure: Secondary | ICD-10-CM | POA: Diagnosis not present

## 2016-10-05 DIAGNOSIS — G4730 Sleep apnea, unspecified: Secondary | ICD-10-CM | POA: Diagnosis not present

## 2016-10-07 NOTE — Progress Notes (Signed)
PROMISE, BUSHONG ([id number]) Visit Report for 10/05/2016 Chief Complaint Document Details Patient Name: Adam Merritt, Adam Merritt Date of Service: 10/05/2016 12:30 PM Medical Record Number: [id number] Patient Account Number: [id number] Date of Birth/Sex: December 29, 1945 (71 y.o. Male) Treating RN: Montey Hora Primary Care Provider: [name] Other Clinician: Referring Provider: [name] Treating Provider/Extender: Frann Rider in Treatment: 1 Information Obtained from: Patient Chief Complaint the patient comes along with his wife for excessive swelling of both lower extremities left more than right and weeping of fluid for over 2 months now Electronic Signature(s) Signed: 10/05/2016 1:16:41 PM By: Christin Fudge MD, FACS Entered By: Christin Fudge on 10/05/2016 13:16:41 Adam Merritt ([id number]) -------------------------------------------------------------------------------- HPI Details Patient Name: Adam Merritt Date of Service: 10/05/2016 12:30 PM Medical Record Number: [id number] Patient Account Number: [id number] Date of Birth/Sex: May 02, 1946 (71 y.o. Male) Treating RN: Montey Hora Primary Care Provider: [name] Other Clinician: Referring Provider: [name] Treating Provider/Extender: Frann Rider in Treatment: 1 History of Present Illness Location: bilateral lower leg edema with some blisters recently and weeping of fluid left side more than right Quality: Patient reports experiencing a dull pain to affected area(s). Severity: Patient states wound are getting worse. Duration: Patient has had the wound for > 3 months prior to seeking treatment at the wound center Timing: Pain in wound is Intermittent (comes and goes Context: The wound appeared gradually over time Modifying Factors: Consults to this date include: he has been seen by his PCP and she sent him off to see [place]. Associated Signs and Symptoms: Patient reports having difficulty standing for long  periods. HPI Description: 71 year old gentleman seen earlier at our clinic in June 2016 and again in June 2017 is noncompliant with recommendations and does not follow-up regularly. 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 He had referred him to the lymphedema clinic and he was accepted but he did not follow-up with them. At that stage he had agreed to get a repeat arterial study done and he has seen Dr. Trula Slade in [place]. This time around he has been seen by his PCP Dr. Tiburcio Bash who referred him urgently to [place] for bilateral lower extremity cellulitis in spite of being on antibiotics -- he was placed on Keflex and 500 mg 3 times a day for 10 days. His comorbidities include coronary artery disease, diabetes mellitus, hypertension, Bell's palsy and chronic lymphedema. he was also recently admitted to the hospital on April 13 and kept overnight with the diagnosis of a TIA and a left ICA stenosis to follow-up with vascular surgery as an outpatient 10/05/2016 -- patient has been compliant this week and left his compression and dressing on for the entire week and had home health change it appropriately. ==== Old Notes: 71 year old gentleman who comes with bilateral lower limb edema for over 20 years and recent attack of blisters and redness for about 2 weeks. The patient says he has had bilateral lower limb edema since 1996. He has been noncompliant with wearing wraps in the past and even was given a boot to wear at night which she never tolerated and didn't wear it. He has a past medical history of sleep apnea, neuropathy with diabetes, hypertension, hyperlipidemia, coronary artery disease, varicose veins, carotid artery disease and is status post  CABG and cholecystectomy. He was  seen by the vascular surgeon on 09/29/2013 and Dr. Trula Slade opinion was:"I have reviewed his HAEDEN, HUDOCK ([id number]) outside ultrasound which shows an ankle-brachial index of 0.72 on the left and 1.0 on the right.o This study was repeated at our office.o The ankle-brachial index on the right was 1.26 with triphasic waveforms.o On the left it was a 1.04 with biphasic waveforms.o The patient had a toe pressure of 121 on the right and 100 on the left. In summary, I would not recommend any further imaging , as I do not think his symptoms are related to arterial insufficiency, but rather secondary to diabetic neuropathy.o The patient still has room for increasing his Neurontin dose, which he states does help him.o I have scheduled him to come back in one year for a repeat arterial evaluation." in June 2016, a year ago, he has a venous duplex study to be done. He was also going to be accepted to the lymphedema clinic but I did not believe the patient went regularly for this. ==== Electronic Signature(s) Signed: 10/05/2016 1:17:26 PM By: Christin Fudge MD, FACS Entered By: Christin Fudge on 10/05/2016 13:17:26 Adam Merritt ([id number]) -------------------------------------------------------------------------------- Physical Exam Details Patient Name: Adam Merritt Date of Service: 10/05/2016 12:30 PM Medical Record Number: [id number] Patient Account Number: [id number] Date of Birth/Sex: [birth year] (71 y.o. Male) Treating RN: Montey Hora Primary Care Provider: [name] Other Clinician: Referring Provider: [name] Treating Provider/Extender: Frann Rider in Treatment: 1 Constitutional . Pulse regular. Respirations normal and unlabored. Afebrile. . Eyes Nonicteric. Reactive to light. Ears, Nose, Mouth, and Throat Lips, teeth, and gums WNL.Marland Kitchen Moist mucosa without lesions. Neck supple and nontender. No palpable supraclavicular or cervical adenopathy. Normal sized without  goiter. Respiratory WNL. No retractions.. Breath sounds WNL, No rubs, rales, rhonchi, or wheeze.. Cardiovascular Heart rhythm and rate regular, no murmur or gallop.. Pedal Pulses WNL. No clubbing, cyanosis or edema. Chest Breasts symmetical and no nipple discharge.. Breast tissue WNL, no masses, lumps, or tenderness.. Lymphatic No adneopathy. No adenopathy. No adenopathy. Musculoskeletal Adexa without tenderness or enlargement.. Digits and nails w/o clubbing, cyanosis, infection, petechiae, ischemia, or inflammatory conditions.. Integumentary (Hair, Skin) No suspicious lesions. No crepitus or fluctuance. No peri-wound warmth or erythema. No masses.Marland Kitchen Psychiatric Judgement and insight Intact.. No evidence of depression, anxiety, or agitation.. Notes the left-sided lymphedema continues to be more significant and he has got several areas of micro- ulcerations and closing but there is no evidence of gross cellulitis. The right side is much better with minimal breakdown of skin. Electronic Signature(s) Signed: 10/05/2016 1:17:57 PM By: Christin Fudge MD, FACS Entered By: Christin Fudge on 10/05/2016 13:17:57 Adam Merritt ([id number]) -------------------------------------------------------------------------------- Physician Orders Details Patient Name: Adam Merritt Date of Service: 10/05/2016 12:30 PM Medical Record Number: [id number] Patient Account Number: [id number] Date of Birth/Sex: 1946/06/04 (71 y.o. Male) Treating RN: Montey Hora Primary Care Provider: [name] Other Clinician: Referring Provider: [name] Treating Provider/Extender: Frann Rider in Treatment: 1 Verbal / Phone Orders: No Diagnosis Coding Wound Cleansing Wound #2 Left,Circumferential Lower Leg o Clean wound with Normal Saline. o Cleanse wound with mild soap and water Wound #3 Right Lower Leg o Clean wound with Normal Saline. o Cleanse wound with mild soap and water Primary Wound  Dressing Wound #2 Left,Circumferential Lower Leg o Aquacel Ag Wound #3 Right Lower Leg o Aquacel Ag Secondary Dressing Wound #2 Left,Circumferential Lower Leg o ABD pad o XtraSorb Wound #3 Right Lower Leg   o ABD pad o XtraSorb Dressing Change Frequency Wound #2 Left,Circumferential Lower Leg o Three times weekly Wound #3 Right Lower Leg o Three times weekly Follow-up Appointments Wound #2 Left,Circumferential Lower Leg o Return Appointment in 1 week. DEKLIN, BIELER ([id number]) Wound #3 Right Lower Leg o Return Appointment in 1 week. Edema Control Wound #2 Left,Circumferential Lower Leg o Kerlix and Coban - Bilateral - may anchor with unna paste Wound #3 Right Lower Leg o Kerlix and Coban - Bilateral - may anchor with unna paste Additional Orders / Instructions Wound #2 Left,Circumferential Lower Leg o Increase protein intake. o Other: - please work to keep blood sugars below 180 Wound #3 Right Lower Leg o Increase protein intake. o Other: - please work to keep blood sugars below [place] #2 Left,Circumferential Lower Leg o [place] Visits - [place] Nurse may visit PRN to address patientos wound care needs. o FACE TO FACE ENCOUNTER: MEDICARE and MEDICAID PATIENTS: I certify that this patient is under my care and that I had a face-to-face encounter that meets the physician face-to-face encounter requirements with this patient on this date. The encounter with the patient was in whole or in part for the following MEDICAL CONDITION: (primary reason for [place]) MEDICAL NECESSITY: I certify, that based on my findings, NURSING services are a medically necessary home health service. HOME BOUND STATUS: I certify that my clinical findings support that this patient is homebound (i.e., Due to illness or injury, pt requires aid of supportive devices such as crutches, cane, wheelchairs, walkers, the  use of special transportation or the assistance of another person to leave their place of residence. There is a normal inability to leave the home and doing so requires considerable and taxing effort. Other absences are for medical reasons / religious services and are infrequent or of short duration when for other reasons). o If current dressing causes regression in wound condition, may D/C ordered dressing product/s and apply Normal Saline Moist Dressing daily until next [place] / Other MD appointment. [place] of regression in wound condition at [phone]. o Please direct any NON-WOUND related issues/requests for orders to patient's Primary Care Physician Wound #3 Right Lower Leg o [place] Visits - [place] Nurse may visit PRN to address patientos wound care needs. o FACE TO FACE ENCOUNTER: MEDICARE and MEDICAID PATIENTS: I certify that this patient is under my care and that I had a face-to-face encounter that meets the physician face-to-face encounter requirements with this patient on this date. The encounter with the patient was in [place]. ([id number]) whole or in part for the following MEDICAL CONDITION: (primary reason for [place]) MEDICAL NECESSITY: I certify, that based on my findings, NURSING services are a medically necessary home health service. HOME BOUND STATUS: I certify that my clinical findings support that this patient is homebound (i.e., Due to illness or injury, pt requires aid of supportive devices such as crutches, cane, wheelchairs, walkers, the use of special transportation or the assistance of another person to leave their place of residence. There is a normal inability to leave the home and doing so requires considerable and taxing effort. Other absences are for medical reasons / religious services and are infrequent or of short duration when for other reasons). o If current  dressing causes regression in wound condition, may D/C ordered dressing product/s and apply Normal Saline Moist Dressing daily until next [facility] / Other MD appointment. [place] of regression in wound condition at [phone]. o Please direct any NON-WOUND related issues/requests for orders to patient's Primary Care Physician Electronic Signature(s) Signed: 10/05/2016 3:54:55 PM By: Christin Fudge MD, FACS Signed: 10/05/2016 4:40:41 PM By: Montey Hora Entered By: Montey Hora on 10/05/2016 13:00:35 Adam Merritt ([id number]) -------------------------------------------------------------------------------- Problem List Details Patient Name: Adam Merritt Date of Service: 10/05/2016 12:30 PM Medical Record Number: [id number] Patient Account Number: [id number] Date of Birth/Sex: [birth year] (71 y.o. 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 Male) Treating RN: Montey Hora Primary Care Provider: [name] Other Clinician: Referring Provider: [name] Treating Provider/Extender: Frann Rider in Treatment: 1 Subjective Chief Complaint Information obtained from Patient the patient comes along with his wife for excessive swelling of both lower extremities left more than right and weeping of fluid for over 2 months now History of Present Illness (HPI) The following HPI elements were documented for the patient's wound: Location: bilateral lower leg edema with some blisters recently and weeping of fluid left side more than right Quality: Patient reports experiencing a dull pain to affected area(s). Severity: Patient states wound are getting worse. Duration: Patient has had the wound for > 3 months prior to seeking treatment at the wound center Timing: Pain in wound is Intermittent (comes and goes Context: The wound appeared gradually over time Modifying Factors: Consults to this date include: he has been seen by his PCP and she sent him off to see [place]. Associated Signs and Symptoms: Patient reports having difficulty standing for long periods. 71 year old gentleman seen earlier at our clinic in June 2016 and again in June 2017 is noncompliant with recommendations and does not follow-up regularly. 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 He had referred him to the lymphedema clinic and he was accepted but he did not follow-up with them. At that stage he had agreed to get a repeat arterial study done and he has seen Dr. Trula Slade in [place]. This time around he has been seen by his PCP Dr. Tiburcio Bash who referred him urgently to [place] for bilateral lower extremity  cellulitis in spite of being on antibiotics -- he was placed on Keflex and 500 mg 3 times a day for 10 days. His comorbidities include coronary artery disease, diabetes mellitus, hypertension, Bell's palsy and chronic lymphedema. he was also recently admitted to the hospital on April 13 and kept overnight with the diagnosis of a TIA and  a left ICA stenosis to follow-up with vascular surgery as an outpatient 10/05/2016 -- patient has been compliant this week and left his compression and dressing on for the entire week and had home health change it appropriately. ==== Old Notes: 71 year old gentleman who comes with bilateral lower limb edema for over 20 years and recent attack of blisters and redness for about 2 weeks. NATHYN, LUIZ ([id number]) The patient says he has had bilateral lower limb edema since 1996. He has been noncompliant with wearing wraps in the past and even was given a boot to wear at night which she never tolerated and didn't wear it. He has a past medical history of sleep apnea, neuropathy with diabetes, hypertension, hyperlipidemia, coronary artery disease, varicose veins, carotid artery disease and is status post CABG and cholecystectomy. He was seen by the vascular surgeon on 09/29/2013 and Dr. Trula Slade opinion was:"I have reviewed his outside ultrasound which shows an ankle-brachial index of 0.72 on the left and 1.0 on the right. This study was repeated at our office. The ankle-brachial index on the right was 1.26 with triphasic waveforms. On the left it was a 1.04 with biphasic waveforms. The patient had a toe pressure of 121 on the right and 100 on the left. In summary, I would not recommend any further imaging , as I do not think his symptoms are related to arterial insufficiency, but rather secondary to diabetic neuropathy. The patient still has room for increasing his Neurontin dose, which he states does help him. I have scheduled him to come back in one year for  a repeat arterial evaluation." in June 2016, a year ago, he has a venous duplex study to be done. He was also going to be accepted to the lymphedema clinic but I did not believe the patient went regularly for this. ==== Objective Constitutional Pulse regular. Respirations normal and unlabored. Afebrile. Vitals Time Taken: 12:48 PM, Height: 65 in, Weight: 336 lbs, BMI: 55.9, Temperature: 97.5 F, Pulse: 75 bpm, Respiratory Rate: 20 breaths/min, Blood Pressure: 130/69 mmHg. Eyes Nonicteric. Reactive to light. Ears, Nose, Mouth, and Throat Lips, teeth, and gums WNL.Marland Kitchen Moist mucosa without lesions. Neck supple and nontender. No palpable supraclavicular or cervical adenopathy. Normal sized without goiter. Respiratory WNL. No retractions.. Breath sounds WNL, No rubs, rales, rhonchi, or wheeze.. Cardiovascular Heart rhythm and rate regular, no murmur or gallop.. Pedal Pulses WNL. No clubbing, cyanosis or edema. Chest Breasts symmetical and no nipple discharge.. Breast tissue WNL, no masses, lumps, or tenderness.LAMEL, MCCARLEY ([id number]) Lymphatic No adneopathy. No adenopathy. No adenopathy. Musculoskeletal Adexa without tenderness or enlargement.. Digits and nails w/o clubbing, cyanosis, infection, petechiae, ischemia, or inflammatory conditions.Marland Kitchen Psychiatric Judgement and insight Intact.. No evidence of depression, anxiety, or agitation.. General Notes: the left-sided lymphedema continues to be more significant and he has got several areas of micro-ulcerations and closing but there is no evidence of gross cellulitis. The right side is much better with minimal breakdown of skin. Integumentary (Hair, Skin) No suspicious lesions. No crepitus or fluctuance. No peri-wound warmth or erythema. No masses.. Wound #2 status is Open. Original cause of wound was Gradually Appeared. The wound is located on the Left,Circumferential Lower Leg. The wound measures 11cm length x 34cm width x 0.1cm  depth; 293.739cm^2 area and 29.374cm^3 volume. The wound is limited to skin breakdown. There is no tunneling or undermining noted. There is a large amount of serous drainage noted. The wound margin is flat and intact. There is medium (34-66%) pink granulation  within the wound bed. There is a medium (34-66%) amount of necrotic tissue within the wound bed including Adherent Slough. The periwound skin appearance did not exhibit: Callus, Crepitus, Excoriation, Induration, Rash, Scarring, Dry/Scaly, Maceration, Atrophie Blanche, Cyanosis, Ecchymosis, Hemosiderin Staining, Mottled, Pallor, Rubor, Erythema. Periwound temperature was noted as No Abnormality. Wound #3 status is Open. Original cause of wound was Gradually Appeared. The wound is located on the Right Lower Leg. The wound measures 2cm length x 1.5cm width x 0.1cm depth; 2.356cm^2 area and 0.236cm^3 volume. The wound is limited to skin breakdown. There is no tunneling or undermining noted. There is a large amount of serous drainage noted. The wound margin is flat and intact. There is large (67- 100%) red granulation within the wound bed. There is no necrotic tissue within the wound bed. The periwound skin appearance did not exhibit: Callus, Crepitus, Excoriation, Induration, Rash, Scarring, Dry/Scaly, Maceration, Atrophie Blanche, Cyanosis, Ecchymosis, Hemosiderin Staining, Mottled, Pallor, Rubor, Erythema. Periwound temperature was noted as No Abnormality. 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 Cleanse  wound with mild soap and water Wound #3 Right Lower Leg: Clean wound with Normal Saline. Cleanse wound with mild soap and water Primary Wound Dressing: Wound #2 Left,Circumferential Lower Leg: Aquacel Ag Wound #3 Right Lower Leg: Aquacel Ag Secondary Dressing: Wound #2 Left,Circumferential Lower Leg: ABD pad XtraSorb Wound #3 Right Lower Leg: ABD pad XtraSorb Dressing Change Frequency: Wound #2 Left,Circumferential Lower Leg: Three times weekly Wound #3 Right Lower Leg: Three times weekly Follow-up Appointments: Wound #2 Left,Circumferential Lower Leg: Return Appointment in 1 week. Wound #3 Right Lower Leg: Return Appointment in 1 week. Edema Control: Wound #2 Left,Circumferential Lower Leg: Kerlix and Coban - Bilateral - may anchor with unna paste Wound #3 Right Lower Leg: Kerlix and Coban - Bilateral - may anchor with unna paste Additional Orders / Instructions: Wound #2 Left,Circumferential Lower Leg: Increase protein intake. Other: - please work to keep blood sugars below 180 Wound #3 Right Lower Leg: Increase protein intake. Other: - please work to keep blood sugars below [place] ([id number]) Home Health: Wound #2 Left,Circumferential Lower Leg: Continue Home Health Visits - [place] Nurse may visit PRN to address patient s wound care needs. FACE TO FACE ENCOUNTER: MEDICARE and MEDICAID PATIENTS: I certify that this patient is under my care and that I had a face-to-face encounter that meets the physician face-to-face encounter requirements with this patient on this date. The encounter with the patient was in whole or in part for the following MEDICAL CONDITION: (primary reason for [place]) MEDICAL NECESSITY: I certify, that based on my findings, NURSING services are a medically necessary home health service. HOME BOUND STATUS: I certify that my clinical findings support that this patient is homebound (i.e., Due to illness or injury,  pt requires aid of supportive devices such as crutches, cane, wheelchairs, walkers, the use of special transportation or the assistance of another person to leave their place of residence. There is a normal inability to leave the home and doing so requires considerable and taxing effort. Other absences are for medical reasons / religious services  and are infrequent or of short duration when for other reasons). If current dressing causes regression in wound condition, may D/C ordered dressing product/s and apply Normal Saline Moist Dressing daily until next [place] / Other MD appointment. [place] of regression in wound condition at [phone]. Please direct any NON-WOUND related issues/requests for orders to patient's Primary Care Physician Wound #3 Right Lower Leg: [place] Visits - [place] Nurse may visit PRN to address patient s wound care needs. FACE TO FACE ENCOUNTER: MEDICARE and MEDICAID PATIENTS: I certify that this patient is under my care and that I had a face-to-face encounter that meets the physician face-to-face encounter requirements with this patient on this date. The encounter with the patient was in whole or in part for the following MEDICAL CONDITION: (primary reason for [place]) MEDICAL NECESSITY: I certify, that based on my findings, NURSING services are a medically necessary home health service. HOME BOUND STATUS: I certify that my clinical findings support that this patient is homebound (i.e., Due to illness or injury, pt requires aid of supportive devices such as crutches, cane, wheelchairs, walkers, the use of special transportation or the assistance of another person to leave their place of residence. There is a normal inability to leave the home and doing so requires considerable and taxing effort. Other absences are for medical reasons / religious services and are infrequent or of short duration when for  other reasons). If current dressing causes regression in wound condition, may D/C ordered dressing product/s and apply Normal Saline Moist Dressing daily until next [place] / Other MD appointment. [place] of regression in wound condition at [phone]. Please direct any NON-WOUND related issues/requests for orders to patient's Primary Care Physician the patient has been much more compliant and has kept his dressings on. After review I have recommended: 1. Silver alginate and a light Kerlix and Coban dressing both lower extremities. 2. Good control of his diabetes mellitus 3. Elevation and exercise. 4. Regular visits to the wound center. Once this skin breakdown is healed we will refer him back to the lymphedema clinic and I have urged him and his wife to be compliant with our recommendations JOANTHONY, HAMZA ([id number]) I was asked to come back and see me at regular intervals and he is agreeable about this. Electronic Signature(s) Signed: 10/05/2016 1:19:22 PM By: Christin Fudge MD, FACS Entered By: Christin Fudge on 10/05/2016 13:19:21 Adam Merritt ([id number]) -------------------------------------------------------------------------------- SuperBill Details Patient Name: Adam Merritt Date of Service: 10/05/2016 Medical Record Number: [id number] Patient Account Number: [id number] Date of Birth/Sex: [birth year] (71 y.o. 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 Facility Procedures CPT4 Code: [id number] Description: 99214 - WOUND CARE VISIT-LEV 4 EST PT Modifier: Quantity: 1 Physician Procedures CPT4 Code Description: [id number] [id number] - WC PHYS LEVEL 3 - EST PT ICD-10 Description Diagnosis 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

## 2016-10-07 NOTE — Progress Notes (Signed)
CORDIE, BUENING ([id number]) Visit Report for 10/05/2016 Arrival Information Details Patient Name: Adam Merritt, Adam Merritt Date of Service: 10/05/2016 12:30 PM Medical Record Number: [id number] Patient Account Number: [id number] Date of Birth/Sex: February 02, 1946 (71 y.o. Male) Treating RN: Montey Hora Primary Care Caitland Porchia: [name] Other Clinician: Referring Jocilynn Grade: [name] Treating Zariya Minner/Extender: Frann Rider in Treatment: 1 Visit Information History Since Last Visit Added or deleted any medications: No Patient Arrived: Ambulatory Any new allergies or adverse reactions: No Arrival Time: 12:40 Had a fall or experienced change in No Accompanied By: spouse activities of daily living that may affect Transfer Assistance: None risk of falls: Patient Identification Verified: Yes Signs or symptoms of abuse/neglect since last No Secondary Verification Yes visito Process Completed: Hospitalized since last visit: No Patient Has Alerts: Yes Has Dressing in Place as Prescribed: Yes Patient Alerts: DMII Has Compression in Place as Prescribed: Yes ABI Clever BILATERAL Pain Present Now: Yes >220 Electronic Signature(s) Signed: 10/05/2016 4:40:41 PM By: Montey Hora Entered By: Montey Hora on 10/05/2016 12:40:53 Adam Merritt ([id number]) -------------------------------------------------------------------------------- Clinic Level of Care Assessment Details Patient Name: Adam Merritt Date of Service: 10/05/2016 12:30 PM Medical Record Number: [id number] Patient Account Number: [id number] Date of Birth/Sex: [birth year] (71 y.o. Male) Treating RN: Montey Hora Primary Care Jaira Canady: [name] Other Clinician: Referring Yosef Krogh: [name] Treating Rylea Selway/Extender: Frann Rider in Treatment: 1 Clinic Level of Care Assessment Items TOOL 4 Quantity Score []  - Use when only an EandM is performed on FOLLOW-UP visit 0 ASSESSMENTS - Nursing Assessment / Reassessment X  - Reassessment of Co-morbidities (includes updates in patient status) 1 10 X - Reassessment of Adherence to Treatment Plan 1 5 ASSESSMENTS - Wound and Skin Assessment / Reassessment []  - Simple Wound Assessment / Reassessment - one wound 0 X - Complex Wound Assessment / Reassessment - multiple wounds 2 5 []  - Dermatologic / Skin Assessment (not related to wound area) 0 ASSESSMENTS - Focused Assessment X - Circumferential Edema Measurements - multi extremities 2 5 []  - Nutritional Assessment / Counseling / Intervention 0 X - Lower Extremity Assessment (monofilament, tuning fork, pulses) 1 5 []  - Peripheral Arterial Disease Assessment (using hand held doppler) 0 ASSESSMENTS - Ostomy and/or Continence Assessment and Care []  - Incontinence Assessment and Management 0 []  - Ostomy Care Assessment and Management (repouching, etc.) 0 PROCESS - Coordination of Care X - Simple Patient / Family Education for ongoing care 1 15 []  - Complex (extensive) Patient / Family Education for ongoing care 0 []  - Staff obtains Programmer, systems, Records, Test Results / Process Orders 0 []  - Staff telephones HHA, Nursing Homes / Clarify orders / etc 0 []  - Routine Transfer to another Facility (non-emergent condition) 0 Adam Merritt, Adam Merritt ([id number]) []  - Routine Hospital Admission (non-emergent condition) 0 []  - New Admissions / Biomedical engineer / Ordering NPWT, Apligraf, etc. 0 []  - Emergency Hospital Admission (emergent condition) 0 X - Simple Discharge Coordination 1 10 []  - Complex (extensive) Discharge Coordination 0 PROCESS - Special Needs []  - Pediatric / Minor Patient Management 0 []  - Isolation Patient Management 0 []  - Hearing / Language / Visual special needs 0 []  - Assessment of Community assistance (transportation, D/C planning, etc.) 0 []  - Additional assistance / Altered mentation 0 []  - Support Surface(s) Assessment (bed, cushion, seat, etc.) 0 INTERVENTIONS - Wound Cleansing / Measurement []  -  Simple Wound Cleansing - one wound 0 X - Complex Wound Cleansing - multiple wounds 2 5 X - Wound Imaging (  photographs - any number of wounds) 1 5 []  - Wound Tracing (instead of photographs) 0 []  - Simple Wound Measurement - one wound 0 X - Complex Wound Measurement - multiple wounds 2 5 INTERVENTIONS - Wound Dressings []  - Small Wound Dressing one or multiple wounds 0 []  - Medium Wound Dressing one or multiple wounds 0 X - Large Wound Dressing one or multiple wounds 2 20 []  - Application of Medications - topical 0 []  - Application of Medications - injection 0 INTERVENTIONS - Miscellaneous []  - External ear exam 0 Adam Merritt, Adam Merritt ([id number]) []  - Specimen Collection (cultures, biopsies, blood, body fluids, etc.) 0 []  - Specimen(s) / Culture(s) sent or taken to Lab for analysis 0 []  - Patient Transfer (multiple staff / Harrel Lemon Lift / Similar devices) 0 []  - Simple Staple / Suture removal (25 or less) 0 []  - Complex Staple / Suture removal (26 or more) 0 []  - Hypo / Hyperglycemic Management (close monitor of Blood Glucose) 0 []  - Ankle / Brachial Index (ABI) - do not check if billed separately 0 X - Vital Signs 1 5 Has the patient been seen at the hospital within the last three years: Yes Total Score: 135 Level Of Care: New/Established - Level 4 Electronic Signature(s) Signed: 10/05/2016 4:40:41 PM By: Montey Hora Entered By: Montey Hora on 10/05/2016 13:01:38 Adam Merritt ([id number]) -------------------------------------------------------------------------------- Encounter Discharge Information Details Patient Name: Adam Merritt Date of Service: 10/05/2016 12:30 PM Medical Record Number: [id number] Patient Account Number: [id number] Date of Birth/Sex: 04/19/1946 (71 y.o. Male) Treating RN: Montey Hora Primary Care Belvie Iribe: [name] Other Clinician: Referring Kierre Hintz: [name] Treating Rykin Route/Extender: Frann Rider in Treatment: 1 Encounter Discharge  Information Items Discharge Pain Level: 0 Discharge Condition: Stable Ambulatory Status: Ambulatory Discharge Destination: Home Transportation: Private Auto Accompanied By: spouse Schedule Follow-up Appointment: Yes Medication Reconciliation completed and provided to Patient/Care No Elena Cothern: Provided on Clinical Summary of Care: 10/05/2016 Form Type Recipient Paper Patient JT Electronic Signature(s) Signed: 10/05/2016 3:03:59 PM By: Montey Hora Previous Signature: 10/05/2016 1:22:05 PM Version By: Ruthine Dose Entered By: Montey Hora on 10/05/2016 15:03:59 Adam Merritt ([id number]) -------------------------------------------------------------------------------- Lower Extremity Assessment Details Patient Name: Adam Merritt Date of Service: 10/05/2016 12:30 PM Medical Record Number: [id number] Patient Account Number: [id number] Date of Birth/Sex: Apr 26, 1946 (71 y.o. Male) Treating RN: Montey Hora Primary Care Jayleon Mcfarlane: [name] Other Clinician: Referring Joydan Gretzinger: [name] Treating Bascom Biel/Extender: Frann Rider in Treatment: 1 Edema Assessment Assessed: [Left: No] [Right: No] E[Left: dema] [Right: :] Calf Left: Right: Point of Measurement: 34 cm From Medial Instep 66.2 cm 56.2 cm Ankle Left: Right: Point of Measurement: 12 cm From Medial Instep 39 cm 33 cm Vascular Assessment Pulses: Dorsalis Pedis Palpable: [Left:Yes] [Right:Yes] Posterior Tibial Extremity colors, hair growth, and conditions: Extremity Color: [Left:Hyperpigmented] [Right:Hyperpigmented] Hair Growth on Extremity: [Left:No] [Right:No] Temperature of Extremity: [Left:Warm] [Right:Warm] Capillary Refill: [Left:< 3 seconds] [Right:< 3 seconds] Electronic Signature(s) Signed: 10/05/2016 4:40:41 PM By: Montey Hora Entered By: Montey Hora on 10/05/2016 12:48:19 Adam Merritt ([id number]) -------------------------------------------------------------------------------- Multi  Wound Chart Details Patient Name: Adam Merritt Date of Service: 10/05/2016 12:30 PM Medical Record Number: [id number] Patient Account Number: [id number] Date of Birth/Sex: 02-Aug-1945 (71 y.o. Male) Treating RN: Montey Hora Primary Care Zaydyn Havey: [name] Other Clinician: Referring Jeydi Klingel: [name] Treating Geniyah Eischeid/Extender: Frann Rider in Treatment: 1 Vital Signs Height(in): 65 Pulse(bpm): 75 Weight(lbs): 336 Blood Pressure 130/69 (mmHg): Body Mass Index(BMI): 56 Temperature(F): 97.5 Respiratory Rate 20 (breaths/min): Photos: [  2:No Photos] [3:No Photos] [N/A:N/A] Wound Location: [2:Left Lower Leg - Circumfernential] [3:Right Lower Leg] [N/A:N/A] Wounding Event: [2:Gradually Appeared] [3:Gradually Appeared] [N/A:N/A] Primary Etiology: [2:Lymphedema] [3:Lymphedema] [N/A:N/A] Comorbid History: [2:Anemia, Lymphedema, Chronic Obstructive Pulmonary Disease (COPD), Sleep Apnea, Congestive Heart Failure, Coronary Artery Disease, Hypertension, Peripheral Venous Disease, Type II Diabetes, Osteoarthritis, Neuropathy, Received  Chemotherapy] [3:Anemia, Lymphedema, Chronic Obstructive Pulmonary Disease (COPD), Sleep Apnea, Congestive Heart Failure, Coronary Artery Disease, Hypertension, Peripheral Venous Disease, Type II Diabetes, Osteoarthritis, Neuropathy, Received  Chemotherapy] [N/A:N/A] Date Acquired: [2:07/10/2016] [3:07/10/2016] [N/A:N/A] Weeks of Treatment: [2:1] [3:1] [N/A:N/A] Wound Status: [2:Open] [3:Open] [N/A:N/A] Measurements L x W x D 11x34x0.1 [3:2x1.5x0.1] [N/A:N/A] (cm) Area (cm) : [2:293.739] [3:2.356] [N/A:N/A] Volume (cm) : [6:23.762] [3:0.236] [N/A:N/A] % Reduction in Area: [2:8.30%] [3:50.00%] [N/A:N/A] % Reduction in Volume: 8.30% [3:49.90%] [N/A:N/A] Classification: [2:Partial Thickness] [3:Partial Thickness] [N/A:N/A] HBO Classification: [2:Grade 1] [3:Grade 1] [N/A:N/A] Exudate Amount: [2:Large] [3:Large] [N/A:N/A] Exudate Type: [2:Serous]  [3:Serous] [N/A:N/A] Exudate Color: [2:amber] [3:amber] [N/A:N/A] Wound Margin: Flat and Intact Flat and Intact N/A Granulation Amount: Medium (34-66%) Large (67-100%) N/A Granulation Quality: Pink Red N/A Necrotic Amount: Medium (34-66%) None Present (0%) N/A Exposed Structures: Fascia: No Fascia: No N/A Fat Layer (Subcutaneous Fat Layer (Subcutaneous Tissue) Exposed: No Tissue) Exposed: No Tendon: No Tendon: No Muscle: No Muscle: No Joint: No Joint: No Bone: No Bone: No Limited to Skin Limited to Skin Breakdown Breakdown Epithelialization: Small (1-33%) None N/A Periwound Skin Texture: Excoriation: No Excoriation: No N/A Induration: No Induration: No Callus: No Callus: No Crepitus: No Crepitus: No Rash: No Rash: No Scarring: No Scarring: No Periwound Skin Maceration: No Maceration: No N/A Moisture: Dry/Scaly: No Dry/Scaly: No Periwound Skin Color: Atrophie Blanche: No Atrophie Blanche: No N/A Cyanosis: No Cyanosis: No Ecchymosis: No Ecchymosis: No Erythema: No Erythema: No Hemosiderin Staining: No Hemosiderin Staining: No Mottled: No Mottled: No Pallor: No Pallor: No Rubor: No Rubor: No Temperature: No Abnormality No Abnormality N/A Tenderness on No No N/A Palpation: Wound Preparation: Ulcer Cleansing: Ulcer Cleansing: N/A Rinsed/Irrigated with Rinsed/Irrigated with Saline Saline Topical Anesthetic Topical Anesthetic Applied: None Applied: None Treatment Notes Electronic Signature(s) Signed: 10/05/2016 1:16:32 PM By: Christin Fudge MD, FACS Entered By: Christin Fudge on 10/05/2016 13:16:32 Adam Merritt ([id number]) -------------------------------------------------------------------------------- [place] Details Patient Name: Adam Merritt Date of Service: 10/05/2016 12:30 PM Medical Record Number: [id number] Patient Account Number: [id number] Date of Birth/Sex: [birth year] (71 y.o. Male) Treating RN: Montey Hora Primary  Care Osiah Haring: [name] Other Clinician: Referring Ilaria Much: [name] Treating Jlyn Bracamonte/Extender: Frann Rider in Treatment: 1 Active Inactive ` Abuse / Safety / Falls / Self Care Management Nursing Diagnoses: Impaired physical mobility Potential for falls Goals: Patient will remain injury free Date Initiated: 09/28/2016 Target Resolution Date: 12/08/2016 Goal Status: Active Interventions: Assess fall risk on admission and as needed Notes: ` Orientation to the Wound Care Program Nursing Diagnoses: Knowledge deficit related to the wound healing center program Goals: Patient/caregiver will verbalize understanding of the [facility] Program Date Initiated: 09/28/2016 Target Resolution Date: 12/08/2016 Goal Status: Active Interventions: Provide education on orientation to the wound center Notes: ` Wound/Skin Impairment Nursing Diagnoses: Impaired tissue integrity Adam Merritt, Adam Merritt ([id number]) Goals: Patient/caregiver will verbalize understanding of skin care regimen Date Initiated: 09/28/2016 Target Resolution Date: 12/08/2016 Goal Status: Active Ulcer/skin breakdown will have a volume reduction of 30% by week 4 Date Initiated: 09/28/2016 Target Resolution Date: 12/08/2016 Goal Status: Active Ulcer/skin breakdown will have a volume reduction of 50% by week 8 Date Initiated: 09/28/2016 Target Resolution  Date: 12/08/2016 Goal Status: Active Ulcer/skin breakdown will have a volume reduction of 80% by week 12 Date Initiated: 09/28/2016 Target Resolution Date: 12/08/2016 Goal Status: Active Ulcer/skin breakdown will heal within 14 weeks Date Initiated: 09/28/2016 Target Resolution Date: 12/09/2016 Goal Status: Active Interventions: Assess patient/caregiver ability to obtain necessary supplies Assess patient/caregiver ability to perform ulcer/skin care regimen upon admission and as needed Assess ulceration(s) every visit Notes: Electronic Signature(s) Signed:  10/05/2016 4:40:41 PM By: Montey Hora Entered By: Montey Hora on 10/05/2016 12:59:58 Adam Merritt ([id number]) -------------------------------------------------------------------------------- Pain Assessment Details Patient Name: Adam Merritt Date of Service: 10/05/2016 12:30 PM Medical Record Number: [id number] Patient Account Number: [id number] Date of Birth/Sex: Jul 17, 1945 (71 y.o. Male) Treating RN: Montey Hora Primary Care Vegas Coffin: [name] Other Clinician: Referring Joury Allcorn: [name] Treating Brekyn Huntoon/Extender: Frann Rider in Treatment: 1 Active Problems Location of Pain Severity and Description of Pain Patient Has Paino Yes Site Locations Pain Management and Medication Current Pain Management: Notes Topical or injectable lidocaine is offered to patient for acute pain when surgical debridement is performed. If needed, Patient is instructed to use over the counter pain medication for the following 24-48 hours after debridement. Wound care MDs do not prescribed pain medications. Patient has chronic pain or uncontrolled pain. Patient has been instructed to make an appointment with their Primary Care Physician for pain management. [id number] Electronic Signature(s) Signed: 10/05/2016 4:40:41 PM By: Montey Hora Entered By: Montey Hora on 10/05/2016 12:41:02 Adam Merritt ([id number]) -------------------------------------------------------------------------------- Patient/Caregiver Education Details Patient Name: Adam Merritt Date of Service: 10/05/2016 12:30 PM Medical Record Number: [id number] Patient Account Number: [id number] Date of Birth/Gender: 1945/11/25 (71 y.o. Male) Treating RN: Montey Hora Primary Care Physician: [name] Other Clinician: Referring Physician: [name] Treating Physician/Extender: Frann Rider in Treatment: 1 Education Assessment Education Provided To: Patient Education Topics Provided Wound/Skin  Impairment: Handouts: Other: wound care as ordered Methods: Demonstration, Explain/Verbal Responses: State content correctly Electronic Signature(s) Signed: 10/05/2016 4:40:41 PM By: Montey Hora Entered By: Montey Hora on 10/05/2016 15:06:04 Adam Merritt ([id number]) -------------------------------------------------------------------------------- Wound Assessment Details Patient Name: Adam Merritt Date of Service: 10/05/2016 12:30 PM Medical Record Number: [id number] Patient Account Number: [id number] Date of Birth/Sex: 06-Jun-1945 (71 y.o. Male) Treating RN: Montey Hora Primary Care Timmy Cleverly: [name] Other Clinician: Referring Angelina Venard: [name] Treating Quetzally Callas/Extender: Frann Rider in Treatment: 1 Wound Status Wound Number: 2 Primary Lymphedema Etiology: Wound Location: Left Lower Leg - Circumfernential Wound Open Status: Wounding Event: Gradually Appeared Comorbid Anemia, Lymphedema, Chronic Date Acquired: 07/10/2016 History: Obstructive Pulmonary Disease (COPD), Weeks Of Treatment: 1 Sleep Apnea, Congestive Heart Failure, Clustered Wound: No Coronary Artery Disease, Hypertension, Peripheral Venous Disease, Type II Diabetes, Osteoarthritis, Neuropathy, Received Chemotherapy Photos Photo Uploaded By: Montey Hora on 10/06/2016 12:50:45 Wound Measurements Length: (cm) 11 Width: (cm) 34 Depth: (cm) 0.1 Area: (cm) 293.739 Volume: (cm) 29.374 % Reduction in Area: 8.3% % Reduction in Volume: 8.3% Epithelialization: Small (1-33%) Tunneling: No Undermining: No Wound Description Classification: Partial Thickness Foul Odor After Diabetic Severity Adam Newport): Grade 1 Slough/Fibrino Wound Margin: Flat and Intact Exudate Amount: Large Exudate Type: Serous Exudate Color: amber Adam Merritt, Adam Merritt ([id number]) Cleansing: No Yes Wound Bed Granulation Amount: Medium (34-66%) Exposed Structure Granulation Quality: Pink Fascia Exposed: No Necrotic  Amount: Medium (34-66%) Fat Layer (Subcutaneous Tissue) Exposed: No Necrotic Quality: Adherent Slough Tendon Exposed: No Muscle Exposed: No Joint Exposed: No Bone Exposed: No Limited to Skin Breakdown Periwound Skin Texture Texture Color No Abnormalities Noted: No No Abnormalities  Noted: No Callus: No Atrophie Blanche: No Crepitus: No Cyanosis: No Excoriation: No Ecchymosis: No Induration: No Erythema: No Rash: No Hemosiderin Staining: No Scarring: No Mottled: No Pallor: No Moisture Rubor: No No Abnormalities Noted: No Dry / Scaly: No Temperature / Pain Maceration: No Temperature: No Abnormality Wound Preparation Ulcer Cleansing: Rinsed/Irrigated with Saline Topical Anesthetic Applied: None Treatment Notes Wound #2 (Left, Circumferential Lower Leg) 1. Cleansed with: Clean wound with Normal Saline 4. Dressing Applied: Aquacel Ag Other dressing (specify in notes) 5. Secondary Dressing Applied ABD Pad 7. Secured with Tape Other (specify in notes) Notes xtrasorb, kerlix and coban wrap with unna to anchor Electronic Signature(s) Signed: 10/05/2016 4:40:41 PM By: Montey Hora Entered By: Montey Hora on 10/05/2016 12:59:21 Adam Merritt ([id number]) Adam Merritt, Adam Merritt ([id number]) -------------------------------------------------------------------------------- Wound Assessment Details Patient Name: Adam Merritt Date of Service: 10/05/2016 12:30 PM Medical Record Number: [id number] Patient Account Number: [id number] Date of Birth/Sex: 21-Feb-1946 (71 y.o. Male) Treating RN: Montey Hora Primary Care Neysha Criado: [name] Other Clinician: Referring Talley Kreiser: [name] Treating Demira Gwynne/Extender: Frann Rider in Treatment: 1 Wound Status Wound Number: 3 Primary Lymphedema Etiology: Wound Location: Right Lower Leg Wound Open Wounding Event: Gradually Appeared Status: Date Acquired: 07/10/2016 Comorbid Anemia, Lymphedema, Chronic Weeks Of  Treatment: 1 History: Obstructive Pulmonary Disease (COPD), Clustered Wound: No Sleep Apnea, Congestive Heart Failure, Coronary Artery Disease, Hypertension, Peripheral Venous Disease, Type II Diabetes, Osteoarthritis, Neuropathy, Received Chemotherapy Photos Photo Uploaded By: Montey Hora on 10/06/2016 12:51:21 Wound Measurements Length: (cm) 2 Width: (cm) 1.5 Depth: (cm) 0.1 Area: (cm) 2.356 Volume: (cm) 0.236 % Reduction in Area: 50% % Reduction in Volume: 49.9% Epithelialization: None Tunneling: No Undermining: No Wound Description Classification: Partial Thickness Foul Odor After Diabetic Severity Adam Newport): Grade 1 Slough/Fibrino Wound Margin: Flat and Intact Exudate Amount: Large Exudate Type: Serous Exudate Color: amber Adam Merritt, Adam Merritt ([id number]) Cleansing: No No Wound Bed Granulation Amount: Large (67-100%) Exposed Structure Granulation Quality: Red Fascia Exposed: No Necrotic Amount: None Present (0%) Fat Layer (Subcutaneous Tissue) Exposed: No Tendon Exposed: No Muscle Exposed: No Joint Exposed: No Bone Exposed: No Limited to Skin Breakdown Periwound Skin Texture Texture Color No Abnormalities Noted: No No Abnormalities Noted: No Callus: No Atrophie Blanche: No Crepitus: No Cyanosis: No Excoriation: No Ecchymosis: No Induration: No Erythema: No Rash: No Hemosiderin Staining: No Scarring: No Mottled: No Pallor: No Moisture Rubor: No No Abnormalities Noted: No Dry / Scaly: No Temperature / Pain Maceration: No Temperature: No Abnormality Wound Preparation Ulcer Cleansing: Rinsed/Irrigated with Saline Topical Anesthetic Applied: None Treatment Notes Wound #3 (Right Lower Leg) 1. Cleansed with: Clean wound with Normal Saline 4. Dressing Applied: Aquacel Ag Other dressing (specify in notes) 5. Secondary Dressing Applied ABD Pad 7. Secured with Tape Other (specify in notes) Notes xtrasorb, kerlix and coban wrap with unna to  anchor Electronic Signature(s) Signed: 10/05/2016 4:40:41 PM By: Montey Hora Entered By: Montey Hora on 10/05/2016 12:59:32 Adam Merritt ([id number]) MALIQUE, DRISKILL ([id number]) -------------------------------------------------------------------------------- [place] Details Patient Name: Adam Merritt Date of Service: 10/05/2016 12:30 PM Medical Record Number: [id number] Patient Account Number: [id number] Date of Birth/Sex: [birth year] (71 y.o. Male) Treating RN: Montey Hora Primary Care Promise Weldin: [name] Other Clinician: Referring Jawad Wiacek: [name] Treating Dawson Albers/Extender: Frann Rider in Treatment: 1 Vital Signs Time Taken: 12:48 Temperature (F): 97.5 Height (in): 65 Pulse (bpm): 75 Weight (lbs): 336 Respiratory Rate (breaths/min): 20 Body Mass Index (BMI): 55.9 Blood Pressure (mmHg): 130/69 Reference Range: 80 - 120 mg / dl Electronic  Signature(s) Signed: 10/05/2016 4:40:41 PM By: Montey Hora Entered By: Montey Hora on 10/05/2016 12:49:18

## 2016-10-12 ENCOUNTER — Ambulatory Visit: Payer: Medicare Other | Admitting: Surgery

## 2016-10-16 ENCOUNTER — Encounter: Payer: Medicare Other | Admitting: Surgery

## 2016-10-16 DIAGNOSIS — E11620 Type 2 diabetes mellitus with diabetic dermatitis: Secondary | ICD-10-CM | POA: Diagnosis not present

## 2016-10-17 NOTE — Progress Notes (Signed)
DEVUN, ANNA ([id number]) Visit Report for 10/16/2016 Chief Complaint Document Details Patient Name: Adam Merritt, Adam Merritt Date of Service: 10/16/2016 2:30 PM Medical Record Number: [id number] Patient Account Number: [id number] Date of Birth/Sex: May 30, 1946 (71 y.o. Male) Treating RN: Montey Hora Primary Care Provider: [name] Other Clinician: Referring Provider: [name] Treating Provider/Extender: Frann Rider in Treatment: 2 Information Obtained from: Patient Chief Complaint the patient comes along with his wife for excessive swelling of both lower extremities left more than right and weeping of fluid for over 2 months now Electronic Signature(s) Signed: 10/16/2016 2:42:34 PM By: Christin Fudge MD, FACS Entered By: Christin Fudge on 10/16/2016 14:42:33 Adam Merritt ([id number]) -------------------------------------------------------------------------------- HPI Details Patient Name: Adam Merritt Date of Service: 10/16/2016 2:30 PM Medical Record Number: [id number] Patient Account Number: [id number] Date of Birth/Sex: 1945-09-08 (71 y.o. Male) Treating RN: Montey Hora Primary Care Provider: [name] Other Clinician: Referring Provider: [name] Treating Provider/Extender: Frann Rider in Treatment: 2 History of Present Illness Location: bilateral lower leg edema with some blisters recently and weeping of fluid left side more than right Quality: Patient reports experiencing a dull pain to affected area(s). Severity: Patient states wound are getting worse. Duration: Patient has had the wound for > 3 months prior to seeking treatment at the wound center Timing: Pain in wound is Intermittent (comes and goes Context: The wound appeared gradually over time Modifying Factors: Consults to this date include: he has been seen by his PCP and she sent him off to see [place]. Associated Signs and Symptoms: Patient reports having difficulty standing for long  periods. HPI Description: 72 year old gentleman seen earlier at our clinic in June 2016 and again in June 2017 is noncompliant with recommendations and does not follow-up regularly. 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 He had referred him to the lymphedema clinic and he was accepted but he did not follow-up with them. At that stage he had agreed to get a repeat arterial study done and he has seen Dr. Trula Slade in [place]. This time around he has been seen by his PCP Dr. Tiburcio Bash who referred him urgently to [place] for bilateral lower extremity cellulitis in spite of being on antibiotics -- he was placed on Keflex and 500 mg 3 times a day for 10 days. His comorbidities include coronary artery disease, diabetes mellitus, hypertension, Bell's palsy and chronic lymphedema. he was also recently admitted to the hospital on April 13 and kept overnight with the diagnosis of a TIA and a left ICA stenosis to follow-up with vascular surgery as an outpatient 10/05/2016 -- patient has been compliant this week and left his compression and dressing on for the entire week and had home health change it appropriately. ==== Old Notes: 71 year old gentleman who comes with bilateral lower limb edema for over 20 years and recent attack of blisters and redness for about 2 weeks. The patient says he has had bilateral lower limb edema since 1996. He has been noncompliant with wearing wraps in the past and even was given a boot to wear at night which she never tolerated and didn't wear it. He has a past medical history of sleep apnea, neuropathy with diabetes, hypertension, hyperlipidemia, coronary artery disease, varicose veins, carotid artery disease and is status post  CABG and cholecystectomy. He was  seen by the vascular surgeon on 09/29/2013 and Dr. Trula Slade opinion was:"I have reviewed his Adam Merritt, Adam Merritt ([id number]) outside ultrasound which shows an ankle-brachial index of 0.72 on the left and 1.0 on the right.o This study was repeated at our office.o The ankle-brachial index on the right was 1.26 with triphasic waveforms.o On the left it was a 1.04 with biphasic waveforms.o The patient had a toe pressure of 121 on the right and 100 on the left. In summary, I would not recommend any further imaging , as I do not think his symptoms are related to arterial insufficiency, but rather secondary to diabetic neuropathy.o The patient still has room for increasing his Neurontin dose, which he states does help him.o I have scheduled him to come back in one year for a repeat arterial evaluation." in June 2016, a year ago, he has a venous duplex study to be done. He was also going to be accepted to the lymphedema clinic but I did not believe the patient went regularly for this. ==== Electronic Signature(s) Signed: 10/16/2016 2:42:44 PM By: Christin Fudge MD, FACS Entered By: Christin Fudge on 10/16/2016 14:42:43 Adam Merritt ([id number]) -------------------------------------------------------------------------------- Physical Exam Details Patient Name: Adam Merritt Date of Service: 10/16/2016 2:30 PM Medical Record Number: [id number] Patient Account Number: [id number] Date of Birth/Sex: 19-Jun-1945 (71 y.o. Male) Treating RN: Montey Hora Primary Care Provider: [name] Other Clinician: Referring Provider: [name] Treating Provider/Extender: Frann Rider in Treatment: 2 Constitutional . Pulse regular. Respirations normal and unlabored. Afebrile. . Eyes Nonicteric. Reactive to light. Ears, Nose, Mouth, and Throat Lips, teeth, and gums WNL.Marland Kitchen Moist mucosa without lesions. Neck supple and nontender. No palpable supraclavicular or cervical adenopathy. Normal sized without  goiter. Respiratory WNL. No retractions.. Cardiovascular Pedal Pulses WNL. No clubbing, cyanosis or edema. Lymphatic No adneopathy. No adenopathy. No adenopathy. Musculoskeletal Adexa without tenderness or enlargement.. Digits and nails w/o clubbing, cyanosis, infection, petechiae, ischemia, or inflammatory conditions.. Integumentary (Hair, Skin) No suspicious lesions. No crepitus or fluctuance. No peri-wound warmth or erythema. No masses.Marland Kitchen Psychiatric Judgement and insight Intact.. No evidence of depression, anxiety, or agitation.. Notes the right leg looks better than the left leg and the microulceration and weeping purposes. The overall improvement has been about the same as last week Electronic Signature(s) Signed: 10/16/2016 2:43:32 PM By: Christin Fudge MD, FACS Entered By: Christin Fudge on 10/16/2016 14:43:32 Adam Merritt ([id number]) -------------------------------------------------------------------------------- Physician Orders Details Patient Name: Adam Merritt Date of Service: 10/16/2016 2:30 PM Medical Record Number: [id number] Patient Account Number: [id number] Date of Birth/Sex: Oct 31, 1945 (71 y.o. Male) Treating RN: Montey Hora Primary Care Provider: [name] Other Clinician: Referring Provider: [name] Treating Provider/Extender: Frann Rider in Treatment: 2 Verbal / Phone Orders: No Diagnosis Coding Wound Cleansing Wound #2 Left,Circumferential Lower Leg o Clean wound with Normal Saline. o Cleanse wound with mild soap and water Wound #3 Right Lower Leg o Clean wound with Normal Saline. o Cleanse wound with mild soap and water Primary Wound Dressing Wound #2 Left,Circumferential Lower Leg o Aquacel Ag Wound #3 Right Lower Leg o Aquacel Ag Secondary Dressing Wound #2 Left,Circumferential Lower Leg o ABD pad o XtraSorb - HHRN PLEASE PROVIDE XTRASORB FOR PATIENT Wound #3 Right Lower Leg o ABD pad o  XtraSorb Dressing Change Frequency Wound #2 Left,Circumferential Lower Leg o Three times weekly Wound #3 Right Lower Leg o Three times weekly Follow-up Appointments Wound #2 Left,Circumferential Lower Leg o Return Appointment in 1  week. RAAHIL, ONG ([id number]) Wound #3 Right Lower Leg o Return Appointment in 1 week. Edema Control Wound #2 Left,Circumferential Lower Leg o Kerlix and Coban - Bilateral - may anchor with unna paste Wound #3 Right Lower Leg o Kerlix and Coban - Bilateral - may anchor with unna paste Additional Orders / Instructions Wound #2 Left,Circumferential Lower Leg o Increase protein intake. o Other: - please work to keep blood sugars below 180 Wound #3 Right Lower Leg o Increase protein intake. o Other: - please work to keep blood sugars below [place] #2 Left,Circumferential Lower Leg o [place] Visits - [place] Nurse may visit PRN to address patientos wound care needs. o FACE TO FACE ENCOUNTER: MEDICARE and MEDICAID PATIENTS: I certify that this patient is under my care and that I had a face-to-face encounter that meets the physician face-to-face encounter requirements with this patient on this date. The encounter with the patient was in whole or in part for the following MEDICAL CONDITION: (primary reason for [place]) MEDICAL NECESSITY: I certify, that based on my findings, NURSING services are a medically necessary home health service. HOME BOUND STATUS: I certify that my clinical findings support that this patient is homebound (i.e., Due to illness or injury, pt requires aid of supportive devices such as crutches, cane, wheelchairs, walkers, the use of special transportation or the assistance of another person to leave their place of residence. There is a normal inability to leave the home and doing so requires considerable and taxing effort. Other absences are for medical  reasons / religious services and are infrequent or of short duration when for other reasons). o If current dressing causes regression in wound condition, may D/C ordered dressing product/s and apply Normal Saline Moist Dressing daily until next [place] / Other MD appointment. [place] of regression in wound condition at [phone]. o Please direct any NON-WOUND related issues/requests for orders to patient's Primary Care Physician Wound #3 Right Lower Leg o [place] Visits - [place] Nurse may visit PRN to address patientos wound care needs. o FACE TO FACE ENCOUNTER: MEDICARE and MEDICAID PATIENTS: I certify that this patient is under my care and that I had a face-to-face encounter that meets the physician face-to-face encounter requirements with this patient on this date. The encounter with the patient was in [place]. ([id number]) whole or in part for the following MEDICAL CONDITION: (primary reason for [place]) MEDICAL NECESSITY: I certify, that based on my findings, NURSING services are a medically necessary home health service. HOME BOUND STATUS: I certify that my clinical findings support that this patient is homebound (i.e., Due to illness or injury, pt requires aid of supportive devices such as crutches, cane, wheelchairs, walkers, the use of special transportation or the assistance of another person to leave their place of residence. There is a normal inability to leave the home and doing so requires considerable and taxing effort. Other absences are for medical reasons / religious services and are infrequent or of short duration when for other reasons). o If current dressing causes regression in wound condition, may D/C ordered dressing product/s and apply Normal Saline Moist Dressing daily until next [place] / Other MD appointment. [place] of regression in wound  condition at [phone]. o Please direct any NON-WOUND related issues/requests for orders to patient's Primary Care Physician Electronic Signature(s) Signed: 10/16/2016 4:48:28 PM By: Con Memos,  Roderick Pee MD, FACS Signed: 10/16/2016 5:05:09 PM By: Montey Hora Entered By: Montey Hora on 10/16/2016 14:41:30 Adam Merritt ([id number]) -------------------------------------------------------------------------------- Problem List Details Patient Name: Adam Merritt Date of Service: 10/16/2016 2:30 PM Medical Record Number: [id number] Patient Account Number: [id number] Date of Birth/Sex: 05-28-1946 (71 y.o. 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 Male) Treating RN: Montey Hora Primary Care Provider: [name] Other Clinician: Referring Provider: [name] Treating Provider/Extender: Frann Rider in Treatment:  2 Subjective Chief Complaint Information obtained from Patient the patient comes along with his wife for excessive swelling of both lower extremities left more than right and weeping of fluid for over 2 months now History of Present Illness (HPI) The following HPI elements were documented for the patient's wound: Location: bilateral lower leg edema with some blisters recently and weeping of fluid left side more than right Quality: Patient reports experiencing a dull pain to affected area(s). Severity: Patient states wound are getting worse. Duration: Patient has had the wound for > 3 months prior to seeking treatment at the wound center Timing: Pain in wound is Intermittent (comes and goes Context: The wound appeared gradually over time Modifying Factors: Consults to this date include: he has been seen by his PCP and she sent him off to see [place]. Associated Signs and Symptoms: Patient reports having difficulty standing for long periods. 71 year old gentleman seen earlier at our clinic in June 2016 and again in June 2017 is noncompliant with recommendations and does not follow-up regularly. 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 He had referred him to the lymphedema clinic and he was accepted but he did not follow-up with them. At that stage he had agreed to get a repeat arterial study done and he has seen Dr. Trula Slade in [place]. This time around he has been seen by his PCP Dr. Tiburcio Bash who referred him urgently to [place] for bilateral lower extremity cellulitis in spite of being on antibiotics -- he was placed on Keflex and 500 mg 3 times a day for 10 days. His comorbidities include coronary artery disease, diabetes mellitus, hypertension, Bell's palsy and chronic lymphedema. he was  also recently admitted to the hospital on April 13 and kept overnight with the diagnosis of a TIA and a left ICA stenosis to follow-up with vascular surgery as an outpatient 10/05/2016 -- patient has been compliant this week and left his compression and dressing on for the entire week and had home health change it appropriately. ====  Old Notes: 71 year old gentleman who comes with bilateral lower limb edema for over 20 years and recent attack of blisters and redness for about 2 weeks. Adam Merritt, Adam Merritt ([id number]) The patient says he has had bilateral lower limb edema since 1996. He has been noncompliant with wearing wraps in the past and even was given a boot to wear at night which she never tolerated and didn't wear it. He has a past medical history of sleep apnea, neuropathy with diabetes, hypertension, hyperlipidemia, coronary artery disease, varicose veins, carotid artery disease and is status post CABG and cholecystectomy. He was seen by the vascular surgeon on 09/29/2013 and Dr. Trula Slade opinion was:"I have reviewed his outside ultrasound which shows an ankle-brachial index of 0.72 on the left and 1.0 on the right. This study was repeated at our office. The ankle-brachial index on the right was 1.26 with triphasic waveforms. On the left it was a 1.04 with biphasic waveforms. The patient had a toe pressure of 121 on the right and 100 on the left. In summary, I would not recommend any further imaging , as I do not think his symptoms are related to arterial insufficiency, but rather secondary to diabetic neuropathy. The patient still has room for increasing his Neurontin dose, which he states does help him. I have scheduled him to come back in one year for a repeat arterial evaluation." in June 2016, a year ago, he has a venous duplex study to be done. He was also going to be accepted to the lymphedema clinic but I did not believe the patient went regularly for  this. ==== Objective Constitutional Pulse regular. Respirations normal and unlabored. Afebrile. Vitals Time Taken: 2:18 PM, Height: 65 in, Weight: 336 lbs, BMI: 55.9, Temperature: 98.1 F, Pulse: 76 bpm, Respiratory Rate: 20 breaths/min, Blood Pressure: 131/64 mmHg. Eyes Nonicteric. Reactive to light. Ears, Nose, Mouth, and Throat Lips, teeth, and gums WNL.Marland Kitchen Moist mucosa without lesions. Neck supple and nontender. No palpable supraclavicular or cervical adenopathy. Normal sized without goiter. Respiratory WNL. No retractions.. Cardiovascular Pedal Pulses WNL. No clubbing, cyanosis or edema. Lymphatic No adneopathy. No adenopathy. No adenopathy. Adam Merritt, Adam Merritt ([id number]) Musculoskeletal Adexa without tenderness or enlargement.. Digits and nails w/o clubbing, cyanosis, infection, petechiae, ischemia, or inflammatory conditions.Marland Kitchen Psychiatric Judgement and insight Intact.. No evidence of depression, anxiety, or agitation.. General Notes: the right leg looks better than the left leg and the microulceration and weeping purposes. The overall improvement has been about the same as last week Integumentary (Hair, Skin) No suspicious lesions. No crepitus or fluctuance. No peri-wound warmth or erythema. No masses.. Wound #2 status is Open. Original cause of wound was Gradually Appeared. The wound is located on the Left,Circumferential Lower Leg. The wound measures 13cm length x 37cm width x 0.1cm depth; 377.777cm^2 area and 37.778cm^3 volume. The wound is limited to skin breakdown. There is no tunneling or undermining noted. There is a large amount of serous drainage noted. The wound margin is flat and intact. There is large (67-100%) pink granulation within the wound bed. There is a small (1-33%) amount of necrotic tissue within the wound bed including Adherent Slough. The periwound skin appearance did not exhibit: Callus, Crepitus, Excoriation, Induration, Rash, Scarring, Dry/Scaly,  Maceration, Atrophie Blanche, Cyanosis, Ecchymosis, Hemosiderin Staining, Mottled, Pallor, Rubor, Erythema. Periwound temperature was noted as No Abnormality. Wound #3 status is Open. Original cause of wound was Gradually Appeared. The wound is located on the Right Lower Leg. The wound measures 2.5cm length x 2cm width x 0.1cm  depth; 3.927cm^2 area and 0.393cm^3 volume. The wound is limited to skin breakdown. There is no tunneling or undermining noted. There is a large amount of serous drainage noted. The wound margin is flat and intact. There is large (67- 100%) red granulation within the wound bed. There is no necrotic tissue within the wound bed. The periwound skin appearance did not exhibit: Callus, Crepitus, Excoriation, Induration, Rash, Scarring, Dry/Scaly, Maceration, Atrophie Blanche, Cyanosis, Ecchymosis, Hemosiderin Staining, Mottled, Pallor, Rubor, Erythema. Periwound temperature was noted as No Abnormality. 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 Cleanse wound with mild soap and water Wound #3 Right Lower Leg: Clean wound with Normal Saline. Cleanse wound with mild soap and water Primary Wound Dressing: Wound #2 Left,Circumferential Lower Leg: Aquacel Ag Wound #3 Right Lower Leg: Aquacel Ag Secondary Dressing: Wound #2 Left,Circumferential Lower Leg: ABD pad XtraSorb - HHRN PLEASE PROVIDE XTRASORB FOR PATIENT Wound #3 Right Lower Leg: ABD pad XtraSorb Dressing Change Frequency: Wound #2 Left,Circumferential Lower Leg: Three times  weekly Wound #3 Right Lower Leg: Three times weekly Follow-up Appointments: Wound #2 Left,Circumferential Lower Leg: Return Appointment in 1 week. Wound #3 Right Lower Leg: Return Appointment in 1 week. Edema Control: Wound #2 Left,Circumferential Lower Leg: Kerlix and Coban - Bilateral - may anchor with unna paste Wound #3 Right Lower Leg: Kerlix and Coban - Bilateral - may anchor with unna paste Additional Orders / Instructions: Wound #2 Left,Circumferential Lower Leg: Increase protein intake. Other: - please work to keep blood sugars below 180 Wound #3 Right Lower Leg: Increase protein intake. Other: - please work to keep blood sugars below [place]: Wound #2 Left,Circumferential Lower Leg: Continue Home Health Visits - [place] Nurse may visit PRN to address patient s wound care needs. Adam Merritt, Adam Merritt ([id number]) FACE TO FACE ENCOUNTER: MEDICARE and MEDICAID PATIENTS: I certify that this patient is under my care and that I had a face-to-face encounter that meets the physician face-to-face encounter requirements with this patient on this date. The encounter with the patient was in whole or in part for the following MEDICAL CONDITION: (primary reason for [place]) MEDICAL NECESSITY: I certify, that based on my findings, NURSING services are a medically necessary home health service. HOME BOUND STATUS: I certify that my clinical findings support that this patient is homebound (i.e., Due to illness or injury, pt requires aid of supportive devices such as crutches, cane, wheelchairs, walkers, the use of special transportation or the assistance of another person to leave their place of residence. There is a normal inability to leave the home and doing so requires considerable and taxing effort. Other absences are for medical reasons / religious services and are infrequent or of short duration when for other reasons). If current dressing causes regression in  wound condition, may D/C ordered dressing product/s and apply Normal Saline Moist Dressing daily until next [place] / Other MD appointment. [place] of regression in wound condition at [phone]. Please direct any NON-WOUND related issues/requests for orders to patient's Primary Care Physician Wound #3 Right Lower Leg: [place] Visits - [facility]  [facility] Nurse may visit PRN to address patient s wound care needs. FACE TO FACE ENCOUNTER: MEDICARE and MEDICAID PATIENTS: I certify that this patient is under my care and that I had a face-to-face encounter that meets the physician face-to-face encounter requirements with this patient on this date. The encounter with the patient was in whole or in part for the following MEDICAL CONDITION: (primary reason for [place]) MEDICAL NECESSITY: I certify, that based on my findings, NURSING services are a medically necessary home health service. HOME BOUND STATUS: I certify that my clinical findings support that this patient is homebound (i.e., Due to illness or injury, pt requires aid of supportive devices such as crutches, cane, wheelchairs, walkers, the use of special transportation or the assistance of another person to leave their place of residence. There is a normal inability to leave the home and doing so requires considerable and taxing effort. Other absences are for medical reasons / religious services and are infrequent or of short duration when for other reasons). If current dressing causes regression in wound condition, may D/C ordered dressing product/s and apply Normal Saline Moist Dressing daily until next [place] / Other MD appointment. [place] of regression in wound condition at [phone]. Please direct any NON-WOUND related issues/requests for orders to patient's Primary Care Physician the response to light compression is slow and after review I have  recommended: 1. Silver alginate and a light Kerlix and Coban dressing both lower extremities. 2. Good control of his diabetes mellitus 3. Elevation and exercise. 4. Regular visits to the wound center. Once this skin breakdown is healed we will refer him back to the lymphedema clinic and I have urged him and his wife to be compliant with our recommendations I was asked to come back and see me at regular intervals and he is agreeable about this. Electronic Signature(s) Adam Merritt, Adam Merritt ([id number]) Signed: 10/16/2016 2:46:15 PM By: Christin Fudge MD, FACS Entered By: Christin Fudge on 10/16/2016 14:46:14 Adam Merritt ([id number]) -------------------------------------------------------------------------------- SuperBill Details Patient Name: Adam Merritt Date of Service: 10/16/2016 Medical Record Number: [id number] Patient Account Number: [id number] Date of Birth/Sex: September 05, 1945 (71 y.o. 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 Facility Procedures CPT4 Code: [id number] Description: 99214 - WOUND CARE VISIT-LEV 4 EST PT Modifier: Quantity: 1 Physician Procedures CPT4 Code Description: 5170017 [id number] - WC PHYS LEVEL 3 - EST PT ICD-10 Description Diagnosis 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

## 2016-10-17 NOTE — Progress Notes (Signed)
Adam Merritt ([id number]) Visit Report for 10/16/2016 Arrival Information Details Patient Name: Adam Merritt, Adam Merritt Date of Service: 10/16/2016 2:30 PM Medical Record Number: [id number] Patient Account Number: [id number] Date of Birth/Sex: 21-Aug-1945 (71 y.o. Male) Treating RN: Montey Hora Primary Care Jhamal Plucinski: [name] Other Clinician: Referring Rosella Crandell: [name] Treating Rennee Coyne/Extender: Frann Rider in Treatment: 2 Visit Information History Since Last Visit Added or deleted any medications: No Patient Arrived: [name] Any new allergies or adverse reactions: No Arrival Time: 14:13 Had a fall or experienced change in No Accompanied By: spouse activities of daily living that may affect Transfer Assistance: None risk of falls: Patient Identification Verified: Yes Signs or symptoms of abuse/neglect since last No Secondary Verification Yes visito Process Completed: Hospitalized since last visit: No Patient Has Alerts: Yes Has Dressing in Place as Prescribed: Yes Patient Alerts: DMII Has Compression in Place as Prescribed: Yes ABI [place] BILATERAL Pain Present Now: Yes >220 Electronic Signature(s) Signed: 10/16/2016 5:05:09 PM By: Montey Hora Entered By: Montey Hora on 10/16/2016 14:14:09 Adam Merritt ([id number]) -------------------------------------------------------------------------------- Clinic Level of Care Assessment Details Patient Name: Adam Merritt Date of Service: 10/16/2016 2:30 PM Medical Record Number: [id number] Patient Account Number: [id number] Date of Birth/Sex: 06-28-1945 (71 y.o. Male) Treating RN: Montey Hora Primary Care Amante Fomby: [name] Other Clinician: Referring Ariyannah Pauling: [name] Treating Mayreli Alden/Extender: Frann Rider in Treatment: 2 Clinic Level of Care Assessment Items TOOL 4 Quantity Score []  - Use when only an EandM is performed on FOLLOW-UP visit 0 ASSESSMENTS - Nursing Assessment / Reassessment X -  Reassessment of Co-morbidities (includes updates in patient status) 1 10 X - Reassessment of Adherence to Treatment Plan 1 5 ASSESSMENTS - Wound and Skin Assessment / Reassessment []  - Simple Wound Assessment / Reassessment - one wound 0 X - Complex Wound Assessment / Reassessment - multiple wounds 2 5 []  - Dermatologic / Skin Assessment (not related to wound area) 0 ASSESSMENTS - Focused Assessment X - Circumferential Edema Measurements - multi extremities 2 5 []  - Nutritional Assessment / Counseling / Intervention 0 X - Lower Extremity Assessment (monofilament, tuning fork, pulses) 1 5 []  - Peripheral Arterial Disease Assessment (using hand held doppler) 0 ASSESSMENTS - Ostomy and/or Continence Assessment and Care []  - Incontinence Assessment and Management 0 []  - Ostomy Care Assessment and Management (repouching, etc.) 0 PROCESS - Coordination of Care X - Simple Patient / Family Education for ongoing care 1 15 []  - Complex (extensive) Patient / Family Education for ongoing care 0 []  - Staff obtains Programmer, systems, Records, Test Results / Process Orders 0 []  - Staff telephones HHA, Nursing Homes / Clarify orders / etc 0 []  - Routine Transfer to another Facility (non-emergent condition) 0 YASIEL, GOYNE ([id number]) []  - Routine Hospital Admission (non-emergent condition) 0 []  - New Admissions / Biomedical engineer / Ordering NPWT, Apligraf, etc. 0 []  - Emergency Hospital Admission (emergent condition) 0 X - Simple Discharge Coordination 1 10 []  - Complex (extensive) Discharge Coordination 0 PROCESS - Special Needs []  - Pediatric / Minor Patient Management 0 []  - Isolation Patient Management 0 []  - Hearing / Language / Visual special needs 0 []  - Assessment of Community assistance (transportation, D/C planning, etc.) 0 []  - Additional assistance / Altered mentation 0 []  - Support Surface(s) Assessment (bed, cushion, seat, etc.) 0 INTERVENTIONS - Wound Cleansing / Measurement []  -  Simple Wound Cleansing - one wound 0 X - Complex Wound Cleansing - multiple wounds 2 5 X - Wound Imaging (  photographs - any number of wounds) 1 5 []  - Wound Tracing (instead of photographs) 0 []  - Simple Wound Measurement - one wound 0 X - Complex Wound Measurement - multiple wounds 2 5 INTERVENTIONS - Wound Dressings []  - Small Wound Dressing one or multiple wounds 0 []  - Medium Wound Dressing one or multiple wounds 0 X - Large Wound Dressing one or multiple wounds 2 20 []  - Application of Medications - topical 0 []  - Application of Medications - injection 0 INTERVENTIONS - Miscellaneous []  - External ear exam 0 TARANCE, BALAN ([id number]) []  - Specimen Collection (cultures, biopsies, blood, body fluids, etc.) 0 []  - Specimen(s) / Culture(s) sent or taken to Lab for analysis 0 []  - Patient Transfer (multiple staff / Civil Service fast streamer / Similar devices) 0 []  - Simple Staple / Suture removal (25 or less) 0 []  - Complex Staple / Suture removal (26 or more) 0 []  - Hypo / Hyperglycemic Management (close monitor of Blood Glucose) 0 []  - Ankle / Brachial Index (ABI) - do not check if billed separately 0 X - Vital Signs 1 5 Has the patient been seen at the hospital within the last three years: Yes Total Score: 135 Level Of Care: New/Established - Level 4 Electronic Signature(s) Signed: 10/16/2016 5:05:09 PM By: Montey Hora Entered By: Montey Hora on 10/16/2016 15:13:23 Adam Merritt ([id number]) -------------------------------------------------------------------------------- Encounter Discharge Information Details Patient Name: Adam Merritt Date of Service: 10/16/2016 2:30 PM Medical Record Number: [id number] Patient Account Number: [id number] Date of Birth/Sex: [birth year] (71 y.o. Male) Treating RN: Montey Hora Primary Care Joshua Soulier: [name] Other Clinician: Referring Vonn Sliger: [name] Treating Feiga Nadel/Extender: Frann Rider in Treatment: 2 Encounter Discharge  Information Items Discharge Pain Level: 0 Discharge Condition: Stable Ambulatory Status: Ambulatory Discharge Destination: Home Transportation: Private Auto Accompanied By: spouse Schedule Follow-up Appointment: Yes Medication Reconciliation completed and provided to Patient/Care No Anneliese Leblond: Provided on Clinical Summary of Care: 10/16/2016 Form Type Recipient Paper Patient JT Electronic Signature(s) Signed: 10/16/2016 2:59:56 PM By: Ruthine Dose Entered By: Ruthine Dose on 10/16/2016 14:59:55 Adam Merritt ([id number]) -------------------------------------------------------------------------------- Lower Extremity Assessment Details Patient Name: Adam Merritt Date of Service: 10/16/2016 2:30 PM Medical Record Number: [id number] Patient Account Number: [id number] Date of Birth/Sex: [birth year] (71 y.o. Male) Treating RN: Montey Hora Primary Care Robben Jagiello: [name] Other Clinician: Referring Caroll Cunnington: [name] Treating Aveya Beal/Extender: Frann Rider in Treatment: 2 Edema Assessment Assessed: [Left: No] [Right: No] Edema: [Left: Yes] [Right: Yes] Calf Left: Right: Point of Measurement: 34 cm From Medial Instep 64.3 cm 57.4 cm Ankle Left: Right: Point of Measurement: 12 cm From Medial Instep 40.8 cm 32.8 cm Vascular Assessment Pulses: Dorsalis Pedis Palpable: [Left:Yes] [Right:Yes] Posterior Tibial Extremity colors, hair growth, and conditions: Extremity Color: [Left:Hyperpigmented] [Right:Hyperpigmented] Hair Growth on Extremity: [Left:No] [Right:No] Temperature of Extremity: [Left:Warm] [Right:Warm] Capillary Refill: [Left:< 3 seconds] [Right:< 3 seconds] Electronic Signature(s) Signed: 10/16/2016 5:05:09 PM By: Montey Hora Entered By: Montey Hora on 10/16/2016 14:31:20 Adam Merritt ([id number]) -------------------------------------------------------------------------------- Multi Wound Chart Details Patient Name: Adam Merritt Date  of Service: 10/16/2016 2:30 PM Medical Record Number: [id number] Patient Account Number: [id number] Date of Birth/Sex: 10-13-1945 (71 y.o. Male) Treating RN: Montey Hora Primary Care Lior Cartelli: [name] Other Clinician: Referring Renesmee Raine: [name] Treating Katianne Barre/Extender: Frann Rider in Treatment: 2 Vital Signs Height(in): 65 Pulse(bpm): 76 Weight(lbs): 336 Blood Pressure 131/64 (mmHg): Body Mass Index(BMI): 56 Temperature(F): 98.1 Respiratory Rate 20 (breaths/min): Photos: [N/A:N/A] Wound Location: Left Lower Leg - Right  Lower Leg N/A Circumfernential Wounding Event: Gradually Appeared Gradually Appeared N/A Primary Etiology: Lymphedema Lymphedema N/A Comorbid History: Anemia, Lymphedema, Anemia, Lymphedema, N/A Chronic Obstructive Chronic Obstructive Pulmonary Disease Pulmonary Disease (COPD), Sleep Apnea, (COPD), Sleep Apnea, Congestive Heart Failure, Congestive Heart Failure, Coronary Artery Disease, Coronary Artery Disease, Hypertension, Peripheral Hypertension, Peripheral Venous Disease, Type II Venous Disease, Type II Diabetes, Osteoarthritis, Diabetes, Osteoarthritis, Neuropathy, Received Neuropathy, Received Chemotherapy Chemotherapy Date Acquired: 07/10/2016 07/10/2016 N/A BRYANT, SAYE ([id number]) Weeks of Treatment: 2 2 N/A Wound Status: Open Open N/A Measurements L x W x D 13x37x0.1 2.5x2x0.1 N/A (cm) Area (cm) : 377.777 3.927 N/A Volume (cm) : 37.778 0.393 N/A % Reduction in Area: -17.90% 16.70% N/A % Reduction in Volume: -17.90% 16.60% N/A Classification: Partial Thickness Partial Thickness N/A HBO Classification: Grade 1 Grade 1 N/A Exudate Amount: Large Large N/A Exudate Type: Serous Serous N/A Exudate Color: amber amber N/A Wound Margin: Flat and Intact Flat and Intact N/A Granulation Amount: Large (67-100%) Large (67-100%) N/A Granulation Quality: Pink Red N/A Necrotic Amount: Small (1-33%) None Present (0%) N/A Exposed  Structures: Fascia: No Fascia: No N/A Fat Layer (Subcutaneous Fat Layer (Subcutaneous Tissue) Exposed: No Tissue) Exposed: No Tendon: No Tendon: No Muscle: No Muscle: No Joint: No Joint: No Bone: No Bone: No Limited to Skin Limited to Skin Breakdown Breakdown Epithelialization: Small (1-33%) None N/A Periwound Skin Texture: Excoriation: No Excoriation: No N/A Induration: No Induration: No Callus: No Callus: No Crepitus: No Crepitus: No Rash: No Rash: No Scarring: No Scarring: No Periwound Skin Maceration: No Maceration: No N/A Moisture: Dry/Scaly: No Dry/Scaly: No Periwound Skin Color: Atrophie Blanche: No Atrophie Blanche: No N/A Cyanosis: No Cyanosis: No Ecchymosis: No Ecchymosis: No Erythema: No Erythema: No Hemosiderin Staining: No Hemosiderin Staining: No Mottled: No Mottled: No Pallor: No Pallor: No Rubor: No Rubor: No Temperature: No Abnormality No Abnormality N/A Tenderness on No No N/A Palpation: Wound Preparation: Ulcer Cleansing: Ulcer Cleansing: N/A Rinsed/Irrigated with Rinsed/Irrigated with Saline Saline JEROLD, YOSS ([id number]) Topical Anesthetic Topical Anesthetic Applied: None Applied: None Treatment Notes Electronic Signature(s) Signed: 10/16/2016 2:42:19 PM By: Christin Fudge MD, FACS Entered By: Christin Fudge on 10/16/2016 14:42:18 Adam Merritt ([id number]) -------------------------------------------------------------------------------- [place] Details Patient Name: Adam Merritt Date of Service: 10/16/2016 2:30 PM Medical Record Number: [id number] Patient Account Number: [id number] Date of Birth/Sex: 1945-06-07 (71 y.o. Male) Treating RN: Montey Hora Primary Care Doye Montilla: [name] Other Clinician: Referring Izza Bickle: [name] Treating Tyjuan Demetro/Extender: Frann Rider in Treatment: 2 Active Inactive ` Abuse / Safety / Falls / Self Care Management Nursing Diagnoses: Impaired  physical mobility Potential for falls Goals: Patient will remain injury free Date Initiated: 09/28/2016 Target Resolution Date: 12/08/2016 Goal Status: Active Interventions: Assess fall risk on admission and as needed Notes: ` Orientation to the Wound Care Program Nursing Diagnoses: Knowledge deficit related to the wound healing center program Goals: Patient/caregiver will verbalize understanding of the [place] Program Date Initiated: 09/28/2016 Target Resolution Date: 12/08/2016 Goal Status: Active Interventions: Provide education on orientation to the wound center Notes: ` Wound/Skin Impairment Nursing Diagnoses: Impaired tissue integrity SANDRA, TELLEFSEN ([id number]) Goals: Patient/caregiver will verbalize understanding of skin care regimen Date Initiated: 09/28/2016 Target Resolution Date: 12/08/2016 Goal Status: Active Ulcer/skin breakdown will have a volume reduction of 30% by week 4 Date Initiated: 09/28/2016 Target Resolution Date: 12/08/2016 Goal Status: Active Ulcer/skin breakdown will have a volume reduction of 50% by week 8 Date Initiated: 09/28/2016 Target Resolution Date: 12/08/2016 Goal Status: Active Ulcer/skin  breakdown will have a volume reduction of 80% by week 12 Date Initiated: 09/28/2016 Target Resolution Date: 12/08/2016 Goal Status: Active Ulcer/skin breakdown will heal within 14 weeks Date Initiated: 09/28/2016 Target Resolution Date: 12/09/2016 Goal Status: Active Interventions: Assess patient/caregiver ability to obtain necessary supplies Assess patient/caregiver ability to perform ulcer/skin care regimen upon admission and as needed Assess ulceration(s) every visit Notes: Electronic Signature(s) Signed: 10/16/2016 5:05:09 PM By: Montey Hora Entered By: Montey Hora on 10/16/2016 14:38:48 Adam Merritt ([id number]) -------------------------------------------------------------------------------- Pain Assessment Details Patient Name:  Adam Merritt Date of Service: 10/16/2016 2:30 PM Medical Record Number: [id number] Patient Account Number: [id number] Date of Birth/Sex: 04/01/1946 (71 y.o. Male) Treating RN: Montey Hora Primary Care Satya Bohall: [name] Other Clinician: Referring Taym Twist: [name] Treating Aubriee Szeto/Extender: Frann Rider in Treatment: 2 Active Problems Location of Pain Severity and Description of Pain Patient Has Paino Yes Site Locations Pain Location: Generalized Pain With Dressing Change: Yes Duration of the Pain. Constant / Intermittento Intermittent Pain Management and Medication Current Pain Management: Notes Topical or injectable lidocaine is offered to patient for acute pain when surgical debridement is performed. If needed, Patient is instructed to use over the counter pain medication for the following 24-48 hours after debridement. Wound care MDs do not prescribed pain medications. Patient has chronic pain or uncontrolled pain. Patient has been instructed to make an appointment with their Primary Care Physician for pain management. Electronic Signature(s) Signed: 10/16/2016 5:05:09 PM By: Montey Hora Entered By: Montey Hora on 10/16/2016 14:14:30 Adam Merritt ([id number]) -------------------------------------------------------------------------------- Patient/Caregiver Education Details Patient Name: Adam Merritt Date of Service: 10/16/2016 2:30 PM Medical Record Number: [id number] Patient Account Number: [id number] Date of Birth/Gender: 1945/07/05 (70 y.o. Male) Treating RN: Montey Hora Primary Care Physician: [name] Other Clinician: Referring Physician: [name] Treating Physician/Extender: Frann Rider in Treatment: 2 Education Assessment Education Provided To: Patient and Caregiver Education Topics Provided Venous: Handouts: Other: compression and leg elevation Methods: Explain/Verbal Responses: State content  correctly Electronic Signature(s) Signed: 10/16/2016 5:05:09 PM By: Montey Hora Entered By: Montey Hora on 10/16/2016 14:40:31 Adam Merritt ([id number]) -------------------------------------------------------------------------------- Wound Assessment Details Patient Name: Adam Merritt Date of Service: 10/16/2016 2:30 PM Medical Record Number: [id number] Patient Account Number: [id number] Date of Birth/Sex: 02/05/1946 (71 y.o. Male) Treating RN: Montey Hora Primary Care Ryen Heitmeyer: [name] Other Clinician: Referring Hermann Dottavio: [name] Treating Jason Frisbee/Extender: Frann Rider in Treatment: 2 Wound Status Wound Number: 2 Primary Lymphedema Etiology: Wound Location: Left Lower Leg - Circumfernential Wound Open Status: Wounding Event: Gradually Appeared Comorbid Anemia, Lymphedema, Chronic Date Acquired: 07/10/2016 History: Obstructive Pulmonary Disease (COPD), Weeks Of Treatment: 2 Sleep Apnea, Congestive Heart Failure, Clustered Wound: No Coronary Artery Disease, Hypertension, Peripheral Venous Disease, Type II Diabetes, Osteoarthritis, Neuropathy, Received Chemotherapy Photos Wound Measurements Length: (cm) 13 Width: (cm) 37 Depth: (cm) 0.1 Area: (cm) 377.777 Volume: (cm) 37.778 % Reduction in Area: -17.9% % Reduction in Volume: -17.9% Epithelialization: Small (1-33%) Tunneling: No Undermining: No Wound Description Classification: Partial Thickness Foul Odor After Diabetic Severity Earleen Newport): Grade 1 Slough/Fibrino Wound Margin: Flat and Intact Exudate Amount: Large Exudate Type: Serous Exudate Color: amber Cleansing: No Yes Wound Bed STAVROS, CAIL ([id number]) Granulation Amount: Large (67-100%) Exposed Structure Granulation Quality: Pink Fascia Exposed: No Necrotic Amount: Small (1-33%) Fat Layer (Subcutaneous Tissue) Exposed: No Necrotic Quality: Adherent Slough Tendon Exposed: No Muscle Exposed: No Joint Exposed: No Bone  Exposed: No Limited to Skin Breakdown Periwound Skin Texture Texture Color No Abnormalities Noted: No No  Abnormalities Noted: No Callus: No Atrophie Blanche: No Crepitus: No Cyanosis: No Excoriation: No Ecchymosis: No Induration: No Erythema: No Rash: No Hemosiderin Staining: No Scarring: No Mottled: No Pallor: No Moisture Rubor: No No Abnormalities Noted: No Dry / Scaly: No Temperature / Pain Maceration: No Temperature: No Abnormality Wound Preparation Ulcer Cleansing: Rinsed/Irrigated with Saline Topical Anesthetic Applied: None Treatment Notes Wound #2 (Left, Circumferential Lower Leg) 1. Cleansed with: Cleanse wound with antibacterial soap and water 4. Dressing Applied: Aquacel Ag Other dressing (specify in notes) 5. Secondary Dressing Applied ABD Pad 7. Secured with Other (specify in notes) Notes kerlix and coban wrap with unna to anchor Electronic Signature(s) Signed: 10/16/2016 2:32:58 PM By: Montey Hora Entered By: Montey Hora on 10/16/2016 14:32:58 Adam Merritt ([id number]) -------------------------------------------------------------------------------- Wound Assessment Details Patient Name: Adam Merritt Date of Service: 10/16/2016 2:30 PM Medical Record Number: [id number] Patient Account Number: [id number] Date of Birth/Sex: 03/23/1946 (71 y.o. Male) Treating RN: Montey Hora Primary Care Zayaan Kozak: [name] Other Clinician: Referring Hawa Henly: [name] Treating Joella Saefong/Extender: Frann Rider in Treatment: 2 Wound Status Wound Number: 3 Primary Lymphedema Etiology: Wound Location: Right Lower Leg Wound Open Wounding Event: Gradually Appeared Status: Date Acquired: 07/10/2016 Comorbid Anemia, Lymphedema, Chronic Weeks Of Treatment: 2 History: Obstructive Pulmonary Disease (COPD), Clustered Wound: No Sleep Apnea, Congestive Heart Failure, Coronary Artery Disease, Hypertension, Peripheral Venous Disease, Type  II Diabetes, Osteoarthritis, Neuropathy, Received Chemotherapy Photos Wound Measurements Length: (cm) 2.5 Width: (cm) 2 Depth: (cm) 0.1 Area: (cm) 3.927 Volume: (cm) 0.393 % Reduction in Area: 16.7% % Reduction in Volume: 16.6% Epithelialization: None Tunneling: No Undermining: No Wound Description Classification: Partial Thickness Foul Odor After Diabetic Severity Earleen Newport): Grade 1 Slough/Fibrino Wound Margin: Flat and Intact Exudate Amount: Large Exudate Type: Serous Exudate Color: amber Cleansing: No No Wound Bed LEONDRE, TAUL ([id number]) Granulation Amount: Large (67-100%) Exposed Structure Granulation Quality: Red Fascia Exposed: No Necrotic Amount: None Present (0%) Fat Layer (Subcutaneous Tissue) Exposed: No Tendon Exposed: No Muscle Exposed: No Joint Exposed: No Bone Exposed: No Limited to Skin Breakdown Periwound Skin Texture Texture Color No Abnormalities Noted: No No Abnormalities Noted: No Callus: No Atrophie Blanche: No Crepitus: No Cyanosis: No Excoriation: No Ecchymosis: No Induration: No Erythema: No Rash: No Hemosiderin Staining: No Scarring: No Mottled: No Pallor: No Moisture Rubor: No No Abnormalities Noted: No Dry / Scaly: No Temperature / Pain Maceration: No Temperature: No Abnormality Wound Preparation Ulcer Cleansing: Rinsed/Irrigated with Saline Topical Anesthetic Applied: None Treatment Notes Wound #3 (Right Lower Leg) 1. Cleansed with: Cleanse wound with antibacterial soap and water 4. Dressing Applied: Aquacel Ag Other dressing (specify in notes) 5. Secondary Dressing Applied ABD Pad 7. Secured with Other (specify in notes) Notes kerlix and coban wrap with unna to anchor Electronic Signature(s) Signed: 10/16/2016 2:33:34 PM By: Montey Hora Entered By: Montey Hora on 10/16/2016 14:33:34 Adam Merritt ([id number]) -------------------------------------------------------------------------------- [place]  Details Patient Name: Adam Merritt Date of Service: 10/16/2016 2:30 PM Medical Record Number: [id number] Patient Account Number: [id number] Date of Birth/Sex: 01-Feb-1946 (71 y.o. Male) Treating RN: Montey Hora Primary Care Riley Hallum: [name] Other Clinician: Referring Jadarius Commons: [name] Treating Marcellis Frampton/Extender: Frann Rider in Treatment: 2 Vital Signs Time Taken: 14:18 Temperature (F): 98.1 Height (in): 65 Pulse (bpm): 76 Weight (lbs): 336 Respiratory Rate (breaths/min): 20 Body Mass Index (BMI): 55.9 Blood Pressure (mmHg): 131/64 Reference Range: 80 - 120 mg / dl Electronic Signature(s) Signed: 10/16/2016 5:05:09 PM By: Montey Hora Entered By: Montey Hora on 10/16/2016 14:18:37

## 2016-10-23 ENCOUNTER — Encounter: Payer: Medicare Other | Admitting: Surgery

## 2016-10-23 DIAGNOSIS — E11620 Type 2 diabetes mellitus with diabetic dermatitis: Secondary | ICD-10-CM | POA: Diagnosis not present

## 2016-10-24 NOTE — Progress Notes (Signed)
Adam, Merritt ([id number]) Visit Report for 10/23/2016 Arrival Information Details Patient Name: Adam Merritt, Adam Merritt Date of Service: 10/23/2016 9:45 AM Medical Record Number: [id number] Patient Account Number: [id number] Date of Birth/Sex: [birth year] (71 y.o. Male) Treating RN: Ahmed Prima Primary Care Faven Watterson: [name] Other Clinician: Referring Ry Moody: [name] Treating Embree Brawley/Extender: Frann Rider in Treatment: 3 Visit Information History Since Last Visit All ordered tests and consults were completed: No Patient Arrived: [name] Added or deleted any medications: No Arrival Time: 10:15 Any new allergies or adverse reactions: No Accompanied By: wife Had a fall or experienced change in No Transfer Assistance: EasyPivot Patient activities of daily living that may affect Lift risk of falls: Patient Identification Verified: Yes Signs or symptoms of abuse/neglect since last No Secondary Verification Process Yes visito Completed: Hospitalized since last visit: No Patient Requires Transmission- No Has Dressing in Place as Prescribed: Yes Based Precautions: Has Compression in Place as Prescribed: Yes Patient Has Alerts: Yes Pain Present Now: No Patient Alerts: DMII ABI [place] BILATERAL >220 Electronic Signature(s) Signed: 10/23/2016 4:36:09 PM By: Alric Quan Entered By: Alric Quan on 10/23/2016 10:18:09 Adam Merritt ([id number]) -------------------------------------------------------------------------------- Clinic Level of Care Assessment Details Patient Name: Adam Merritt Date of Service: 10/23/2016 9:45 AM Medical Record Number: [id number] Patient Account Number: [id number] Date of Birth/Sex: 1945-11-26 (71 y.o. Male) Treating RN: Montey Hora Primary Care Alvilda Mckenna: [name] Other Clinician: Referring Renard Caperton: [name] Treating Akira Adelsberger/Extender: Frann Rider in Treatment: 3 Clinic Level of Care Assessment Items TOOL 4  Quantity Score []  - Use when only an EandM is performed on FOLLOW-UP visit 0 ASSESSMENTS - Nursing Assessment / Reassessment X - Reassessment of Co-morbidities (includes updates in patient status) 1 10 X - Reassessment of Adherence to Treatment Plan 1 5 ASSESSMENTS - Wound and Skin Assessment / Reassessment []  - Simple Wound Assessment / Reassessment - one wound 0 X - Complex Wound Assessment / Reassessment - multiple wounds 2 5 []  - Dermatologic / Skin Assessment (not related to wound area) 0 ASSESSMENTS - Focused Assessment X - Circumferential Edema Measurements - multi extremities 2 5 []  - Nutritional Assessment / Counseling / Intervention 0 X - Lower Extremity Assessment (monofilament, tuning fork, pulses) 1 5 []  - Peripheral Arterial Disease Assessment (using hand held doppler) 0 ASSESSMENTS - Ostomy and/or Continence Assessment and Care []  - Incontinence Assessment and Management 0 []  - Ostomy Care Assessment and Management (repouching, etc.) 0 PROCESS - Coordination of Care X - Simple Patient / Family Education for ongoing care 1 15 []  - Complex (extensive) Patient / Family Education for ongoing care 0 []  - Staff obtains Programmer, systems, Records, Test Results / Process Orders 0 []  - Staff telephones HHA, Nursing Homes / Clarify orders / etc 0 []  - Routine Transfer to another Facility (non-emergent condition) 0 COE, ANGELOS ([id number]) []  - Routine Hospital Admission (non-emergent condition) 0 []  - New Admissions / Biomedical engineer / Ordering NPWT, Apligraf, etc. 0 []  - Emergency Hospital Admission (emergent condition) 0 X - Simple Discharge Coordination 1 10 []  - Complex (extensive) Discharge Coordination 0 PROCESS - Special Needs []  - Pediatric / Minor Patient Management 0 []  - Isolation Patient Management 0 []  - Hearing / Language / Visual special needs 0 []  - Assessment of Community assistance (transportation, D/C planning, etc.) 0 []  - Additional assistance / Altered  mentation 0 []  - Support Surface(s) Assessment (bed, cushion, seat, etc.) 0 INTERVENTIONS - Wound Cleansing / Measurement []  - Simple Wound Cleansing - one  wound 0 X - Complex Wound Cleansing - multiple wounds 2 5 X - Wound Imaging (photographs - any number of wounds) 1 5 []  - Wound Tracing (instead of photographs) 0 []  - Simple Wound Measurement - one wound 0 X - Complex Wound Measurement - multiple wounds 2 5 INTERVENTIONS - Wound Dressings []  - Small Wound Dressing one or multiple wounds 0 []  - Medium Wound Dressing one or multiple wounds 0 X - Large Wound Dressing one or multiple wounds 1 20 []  - Application of Medications - topical 0 []  - Application of Medications - injection 0 INTERVENTIONS - Miscellaneous []  - External ear exam 0 WESSLEY, EMERT ([id number]) []  - Specimen Collection (cultures, biopsies, blood, body fluids, etc.) 0 []  - Specimen(s) / Culture(s) sent or taken to Lab for analysis 0 []  - Patient Transfer (multiple staff / Harrel Lemon Lift / Similar devices) 0 []  - Simple Staple / Suture removal (25 or less) 0 []  - Complex Staple / Suture removal (26 or more) 0 []  - Hypo / Hyperglycemic Management (close monitor of Blood Glucose) 0 []  - Ankle / Brachial Index (ABI) - do not check if billed separately 0 X - Vital Signs 1 5 Has the patient been seen at the hospital within the last three years: Yes Total Score: 115 Level Of Care: New/Established - Level 3 Electronic Signature(s) Signed: 10/23/2016 4:49:11 PM By: Montey Hora Entered By: Montey Hora on 10/23/2016 11:10:55 Adam Merritt ([id number]) -------------------------------------------------------------------------------- Encounter Discharge Information Details Patient Name: Adam Merritt Date of Service: 10/23/2016 9:45 AM Medical Record Number: [id number] Patient Account Number: [id number] Date of Birth/Sex: 1945/09/04 (71 y.o. Male) Treating RN: Montey Hora Primary Care Paloma Grange: [name] Other  Clinician: Referring Norabelle Kondo: [name] Treating Pearlee Arvizu/Extender: Frann Rider in Treatment: 3 Encounter Discharge Information Items Discharge Pain Level: 0 Discharge Condition: Stable Ambulatory Status: Cane Discharge Destination: Home Transportation: Private Auto Accompanied By: spouse Schedule Follow-up Appointment: Yes Medication Reconciliation completed No and provided to Patient/Care Onnie Hatchel: Provided on Clinical Summary of Care: 10/23/2016 Form Type Recipient Paper Patient JT Electronic Signature(s) Signed: 10/23/2016 11:00:51 AM By: Ruthine Dose Entered By: Ruthine Dose on 10/23/2016 11:00:50 Adam Merritt ([id number]) -------------------------------------------------------------------------------- Lower Extremity Assessment Details Patient Name: Adam Merritt Date of Service: 10/23/2016 9:45 AM Medical Record Number: [id number] Patient Account Number: [id number] Date of Birth/Sex: [birth year] (71 y.o. Male) Treating RN: Ahmed Prima Primary Care Coutney Wildermuth: [name] Other Clinician: Referring Parthenia Tellefsen: [name] Treating Ryder Chesmore/Extender: Frann Rider in Treatment: 3 Edema Assessment Assessed: [Left: No] [Right: No] Edema: [Left: Yes] [Right: Yes] Calf Left: Right: Point of Measurement: 34 cm From Medial Instep 62.3 cm 56.5 cm Ankle Left: Right: Point of Measurement: 12 cm From Medial Instep 43 cm 33.5 cm Vascular Assessment Pulses: Dorsalis Pedis Palpable: [Left:Yes] [Right:Yes] Posterior Tibial Extremity colors, hair growth, and conditions: Extremity Color: [Left:Hyperpigmented] [Right:Hyperpigmented] Temperature of Extremity: [Left:Warm] [Right:Warm] Capillary Refill: [Left:< 3 seconds] [Right:< 3 seconds] Electronic Signature(s) Signed: 10/23/2016 4:36:09 PM By: Alric Quan Entered By: Alric Quan on 10/23/2016 10:34:02 Adam Merritt  ([id number]) -------------------------------------------------------------------------------- Multi Wound Chart Details Patient Name: Adam Merritt Date of Service: 10/23/2016 9:45 AM Medical Record Number: [id number] Patient Account Number: [id number] Date of Birth/Sex: 29-Jun-1945 (71 y.o. Male) Treating RN: Ahmed Prima Primary Care Lilian Fuhs: [name] Other Clinician: Referring Kerryn Tennant: [name] Treating Jin Shockley/Extender: Frann Rider in Treatment: 3 Vital Signs Height(in): 65 Pulse(bpm): 88 Weight(lbs): 336 Blood Pressure 128/71 (mmHg): Body Mass Index(BMI): 56 Temperature(F): 98.2 Respiratory Rate 20 (  breaths/min): Photos: [2:No Photos] [3:No Photos] [N/A:N/A] Wound Location: [2:Left Lower Leg - Circumfernential] [3:Right Lower Leg] [N/A:N/A] Wounding Event: [2:Gradually Appeared] [3:Gradually Appeared] [N/A:N/A] Primary Etiology: [2:Lymphedema] [3:Lymphedema] [N/A:N/A] Comorbid History: [2:Anemia, Lymphedema, Chronic Obstructive Pulmonary Disease (COPD), Sleep Apnea, Congestive Heart Failure, Coronary Artery Disease, Hypertension, Peripheral Venous Disease, Type II Diabetes, Osteoarthritis, Neuropathy, Received  Chemotherapy] [3:Anemia, Lymphedema, Chronic Obstructive Pulmonary Disease (COPD), Sleep Apnea, Congestive Heart Failure, Coronary Artery Disease, Hypertension, Peripheral Venous Disease, Type II Diabetes, Osteoarthritis, Neuropathy, Received  Chemotherapy] [N/A:N/A] Date Acquired: [2:07/10/2016] [3:07/10/2016] [N/A:N/A] Weeks of Treatment: [2:3] [3:3] [N/A:N/A] Wound Status: [2:Open] [3:Open] [N/A:N/A] Measurements L x W x D 13x57.8x0.1 [3:0.1x0.1x0.1] [N/A:N/A] (cm) Area (cm) : [2:590.148] [3:0.008] [N/A:N/A] Volume (cm) : [2:59.015] [3:0.001] [N/A:N/A] % Reduction in Area: [2:-84.20%] [3:99.80%] [N/A:N/A] % Reduction in Volume: -84.20% [3:99.80%] [N/A:N/A] Classification: [2:Partial Thickness] [3:Partial Thickness] [N/A:N/A] HBO  Classification: [2:Grade 1] [3:Grade 1] [N/A:N/A] Exudate Amount: [2:Large] [3:Large] [N/A:N/A] Exudate Type: [2:Serosanguineous] [3:Serous] [N/A:N/A] Exudate Color: [2:red, brown] [3:amber] [N/A:N/A] Foul Odor After Yes No N/A Cleansing: Odor Anticipated Due to No N/A N/A Product Use: Wound Margin: Flat and Intact Flat and Intact N/A Granulation Amount: Medium (34-66%) Medium (34-66%) N/A Granulation Quality: Pink Red N/A Necrotic Amount: Medium (34-66%) Medium (34-66%) N/A Exposed Structures: Fascia: No Fascia: No N/A Fat Layer (Subcutaneous Fat Layer (Subcutaneous Tissue) Exposed: No Tissue) Exposed: No Tendon: No Tendon: No Muscle: No Muscle: No Joint: No Joint: No Bone: No Bone: No Limited to Skin Limited to Skin Breakdown Breakdown Epithelialization: Small (1-33%) None N/A Periwound Skin Texture: Excoriation: No Excoriation: No N/A Induration: No Induration: No Callus: No Callus: No Crepitus: No Crepitus: No Rash: No Rash: No Scarring: No Scarring: No Periwound Skin Maceration: Yes Maceration: No N/A Moisture: Dry/Scaly: No Dry/Scaly: No Periwound Skin Color: Atrophie Blanche: No Atrophie Blanche: No N/A Cyanosis: No Cyanosis: No Ecchymosis: No Ecchymosis: No Erythema: No Erythema: No Hemosiderin Staining: No Hemosiderin Staining: No Mottled: No Mottled: No Pallor: No Pallor: No Rubor: No Rubor: No Temperature: No Abnormality No Abnormality N/A Tenderness on Yes Yes N/A Palpation: Wound Preparation: Ulcer Cleansing: Ulcer Cleansing: N/A Rinsed/Irrigated with Rinsed/Irrigated with Saline Saline Topical Anesthetic Topical Anesthetic Applied: Other: lidocaine Applied: Other: lidocaine 4% 4% Treatment Notes Electronic Signature(s) Signed: 10/23/2016 10:48:13 AM By: Christin Fudge MD, FACS RASHOD, GOUGEON ([id number]) Entered By: Christin Fudge on 10/23/2016 10:48:12 Adam Merritt  ([id number]) -------------------------------------------------------------------------------- [place] Details Patient Name: Adam Merritt Date of Service: 10/23/2016 9:45 AM Medical Record Number: [id number] Patient Account Number: [id number] Date of Birth/Sex: 1945-08-10 (71 y.o. Male) Treating RN: Ahmed Prima Primary Care Dymond Gutt: [name] Other Clinician: Referring Kaelah Hayashi: [name] Treating Demarie Uhlig/Extender: Frann Rider in Treatment: 3 Active Inactive ` Abuse / Safety / Falls / Self Care Management Nursing Diagnoses: Impaired physical mobility Potential for falls Goals: Patient will remain injury free Date Initiated: 09/28/2016 Target Resolution Date: 12/08/2016 Goal Status: Active Interventions: Assess fall risk on admission and as needed Notes: ` Orientation to the Wound Care Program Nursing Diagnoses: Knowledge deficit related to the wound healing center program Goals: Patient/caregiver will verbalize understanding of the [place] Date Initiated: 09/28/2016 Target Resolution Date: 12/08/2016 Goal Status: Active Interventions: Provide education on orientation to the wound center Notes: ` Wound/Skin Impairment Nursing Diagnoses: Impaired tissue integrity RAMIREZ, FULLBRIGHT ([id number]) Goals: Patient/caregiver will verbalize understanding of skin care regimen Date Initiated: 09/28/2016 Target Resolution Date: 12/08/2016 Goal Status: Active Ulcer/skin breakdown will have a volume reduction of 30% by week 4 Date  Initiated: 09/28/2016 Target Resolution Date: 12/08/2016 Goal Status: Active Ulcer/skin breakdown will have a volume reduction of 50% by week 8 Date Initiated: 09/28/2016 Target Resolution Date: 12/08/2016 Goal Status: Active Ulcer/skin breakdown will have a volume reduction of 80% by week 12 Date Initiated: 09/28/2016 Target Resolution Date: 12/08/2016 Goal Status: Active Ulcer/skin breakdown will heal  within 14 weeks Date Initiated: 09/28/2016 Target Resolution Date: 12/09/2016 Goal Status: Active Interventions: Assess patient/caregiver ability to obtain necessary supplies Assess patient/caregiver ability to perform ulcer/skin care regimen upon admission and as needed Assess ulceration(s) every visit Notes: Electronic Signature(s) Signed: 10/23/2016 4:36:09 PM By: Alric Quan Entered By: Alric Quan on 10/23/2016 10:34:14 Adam Merritt ([id number]) -------------------------------------------------------------------------------- Pain Assessment Details Patient Name: Adam Merritt Date of Service: 10/23/2016 9:45 AM Medical Record Number: [id number] Patient Account Number: [id number] Date of Birth/Sex: 1945-11-30 (71 y.o. Male) Treating RN: Ahmed Prima Primary Care Tyvion Edmondson: [name] Other Clinician: Referring Masiyah Engen: [name] Treating Haniel Fix/Extender: Frann Rider in Treatment: 3 Active Problems Location of Pain Severity and Description of Pain Patient Has Paino No Site Locations With Dressing Change: No Pain Management and Medication Current Pain Management: Electronic Signature(s) Signed: 10/23/2016 4:36:09 PM By: Alric Quan Entered By: Alric Quan on 10/23/2016 10:18:17 Adam Merritt ([id number]) -------------------------------------------------------------------------------- Patient/Caregiver Education Details Patient Name: Adam Merritt Date of Service: 10/23/2016 9:45 AM Medical Record Number: [id number] Patient Account Number: [id number] Date of Birth/Gender: 05-07-1946 (71 y.o. Male) Treating RN: Montey Hora Primary Care Physician: [name] Other Clinician: Referring Physician: [name] Treating Physician/Extender: Frann Rider in Treatment: 3 Education Assessment Education Provided To: Patient and Caregiver Education Topics Provided Venous: Handouts: Other: need for compression on RLE Methods:  Explain/Verbal Responses: State content correctly Electronic Signature(s) Signed: 10/23/2016 4:49:11 PM By: Montey Hora Entered By: Montey Hora on 10/23/2016 10:42:13 Adam Merritt ([id number]) -------------------------------------------------------------------------------- Wound Assessment Details Patient Name: Adam Merritt Date of Service: 10/23/2016 9:45 AM Medical Record Number: [id number] Patient Account Number: [id number] Date of Birth/Sex: 1945/06/09 (71 y.o. Male) Treating RN: Ahmed Prima Primary Care Zaydn Gutridge: [name] Other Clinician: Referring Bennett Vanscyoc: [name] Treating Shayan Bramhall/Extender: Frann Rider in Treatment: 3 Wound Status Wound Number: 2 Primary Lymphedema Etiology: Wound Location: Left Lower Leg - Circumfernential Wound Open Status: Wounding Event: Gradually Appeared Comorbid Anemia, Lymphedema, Chronic Date Acquired: 07/10/2016 History: Obstructive Pulmonary Disease (COPD), Weeks Of Treatment: 3 Sleep Apnea, Congestive Heart Failure, Clustered Wound: No Coronary Artery Disease, Hypertension, Peripheral Venous Disease, Type II Diabetes, Osteoarthritis, Neuropathy, Received Chemotherapy Photos Photo Uploaded By: Alric Quan on 10/23/2016 16:15:35 Wound Measurements Length: (cm) 13 Width: (cm) 57.8 Depth: (cm) 0.1 Area: (cm) 590.148 Volume: (cm) 59.015 % Reduction in Area: -84.2% % Reduction in Volume: -84.2% Epithelialization: Small (1-33%) Tunneling: No Undermining: No Wound Description Classification: Partial Thickness Diabetic Severity Earleen Newport): Grade 1 Wound Margin: Flat and Intact Exudate Amount: Large Exudate Type: Serosanguineous Exudate Color: red, brown TAHJAE, CLAUSING ([id number]) Foul Odor After Cleansing: Yes Due to Product Use: No Slough/Fibrino Yes Wound Bed Granulation Amount: Medium (34-66%) Exposed Structure Granulation Quality: Pink Fascia Exposed: No Necrotic Amount: Medium (34-66%) Fat  Layer (Subcutaneous Tissue) Exposed: No Necrotic Quality: Adherent Slough Tendon Exposed: No Muscle Exposed: No Joint Exposed: No Bone Exposed: No Limited to Skin Breakdown Periwound Skin Texture Texture Color No Abnormalities Noted: No No Abnormalities Noted: No Callus: No Atrophie Blanche: No Crepitus: No Cyanosis: No Excoriation: No Ecchymosis: No Induration: No Erythema: No Rash: No Hemosiderin Staining: No Scarring: No Mottled: No Pallor: No Moisture  Rubor: No No Abnormalities Noted: No Dry / Scaly: No Temperature / Pain Maceration: Yes Temperature: No Abnormality Tenderness on Palpation: Yes Wound Preparation Ulcer Cleansing: Rinsed/Irrigated with Saline Topical Anesthetic Applied: Other: lidocaine 4%, Treatment Notes Wound #2 (Left, Circumferential Lower Leg) 1. Cleansed with: Cleanse wound with antibacterial soap and water 2. Anesthetic Topical Lidocaine 4% cream to wound bed prior to debridement 4. Dressing Applied: Aquacel Ag Other dressing (specify in notes) Notes kerlix and coban wrap with unna to anchor, xtrasorb Electronic Signature(s) Signed: 10/23/2016 4:36:09 PM By: Alric Quan Entered By: Alric Quan on 10/23/2016 10:30:52 Adam Merritt ([id number]) -------------------------------------------------------------------------------- Wound Assessment Details Patient Name: Adam Merritt Date of Service: 10/23/2016 9:45 AM Medical Record Number: [id number] Patient Account Number: [id number] Date of Birth/Sex: 17-Oct-1945 (71 y.o. Male) Treating RN: Montey Hora Primary Care Jru Pense: [name] Other Clinician: Referring Judge Duque: [name] Treating Aaliyah Cancro/Extender: Frann Rider in Treatment: 3 Wound Status Wound Number: 3 Primary Lymphedema Etiology: Wound Location: Right Lower Leg Wound Open Wounding Event: Gradually Appeared Status: Date Acquired: 07/10/2016 Comorbid Anemia, Lymphedema, Chronic Weeks Of  Treatment: 3 History: Obstructive Pulmonary Disease (COPD), Clustered Wound: No Sleep Apnea, Congestive Heart Failure, Coronary Artery Disease, Hypertension, Peripheral Venous Disease, Type II Diabetes, Osteoarthritis, Neuropathy, Received Chemotherapy Photos Photo Uploaded By: Alric Quan on 10/23/2016 16:16:04 Wound Measurements Length: (cm) 0.1 Width: (cm) 0.1 Depth: (cm) 0.1 Area: (cm) 0.008 Volume: (cm) 0.001 % Reduction in Area: 99.8% % Reduction in Volume: 99.8% Epithelialization: None Tunneling: No Undermining: No Wound Description Classification: Partial Thickness Foul Odor After Diabetic Severity Earleen Newport): Grade 1 Slough/Fibrino Wound Margin: Flat and Intact Exudate Amount: Large Exudate Type: Serous Exudate Color: amber SHANDON, BURLINGAME ([id number]) Cleansing: No No Wound Bed Granulation Amount: Medium (34-66%) Exposed Structure Granulation Quality: Red Fascia Exposed: No Necrotic Amount: Medium (34-66%) Fat Layer (Subcutaneous Tissue) Exposed: No Tendon Exposed: No Muscle Exposed: No Joint Exposed: No Bone Exposed: No Limited to Skin Breakdown Periwound Skin Texture Texture Color No Abnormalities Noted: No No Abnormalities Noted: No Callus: No Atrophie Blanche: No Crepitus: No Cyanosis: No Excoriation: No Ecchymosis: No Induration: No Erythema: No Rash: No Hemosiderin Staining: No Scarring: No Mottled: No Pallor: No Moisture Rubor: No No Abnormalities Noted: No Dry / Scaly: No Temperature / Pain Maceration: No Temperature: No Abnormality Tenderness on Palpation: Yes Wound Preparation Ulcer Cleansing: Rinsed/Irrigated with Saline Topical Anesthetic Applied: Other: lidocaine 4%, Electronic Signature(s) Signed: 10/23/2016 4:49:11 PM By: Montey Hora Entered By: Montey Hora on 10/23/2016 10:42:41 Adam Merritt ([id number]) -------------------------------------------------------------------------------- [place]  Details Patient Name: Adam Merritt Date of Service: 10/23/2016 9:45 AM Medical Record Number: [id number] Patient Account Number: [id number] Date of Birth/Sex: 02-Jul-1945 (71 y.o. Male) Treating RN: Ahmed Prima Primary Care Daniele Dillow: [name] Other Clinician: Referring Sheletha Bow: [name] Treating Kensly Bowmer/Extender: Frann Rider in Treatment: 3 Vital Signs Time Taken: 10:18 Temperature (F): 98.2 Height (in): 65 Pulse (bpm): 88 Weight (lbs): 336 Respiratory Rate (breaths/min): 20 Body Mass Index (BMI): 55.9 Blood Pressure (mmHg): 128/71 Reference Range: 80 - 120 mg / dl Electronic Signature(s) Signed: 10/23/2016 4:36:09 PM By: Alric Quan Entered By: Alric Quan on 10/23/2016 10:21:59

## 2016-10-24 NOTE — Progress Notes (Signed)
Adam Merritt ([id number]) Visit Report for 10/23/2016 Chief Complaint Document Details Patient Name: Adam Merritt Date of Service: 10/23/2016 9:45 AM Medical Record Number: [id number] Patient Account Number: [id number] Date of Birth/Sex: 1946/04/19 (71 y.o. Male) Treating RN: Adam Merritt Primary Care Provider: Clayborn Merritt Other Clinician: Referring Provider: Clayborn Merritt Treating Provider/Extender: Adam Merritt in Treatment: 3 Information Obtained from: Patient Chief Complaint the patient comes along with his wife for excessive swelling of both lower extremities left more than right and weeping of fluid for over 2 months now Electronic Signature(s) Signed: 10/23/2016 10:48:22 AM By: Adam Fudge MD, FACS Entered By: Adam Merritt on 10/23/2016 10:48:22 Adam Merritt ([id number]) -------------------------------------------------------------------------------- HPI Details Patient Name: Adam Merritt Date of Service: 10/23/2016 9:45 AM Medical Record Number: [id number] Patient Account Number: [id number] Date of Birth/Sex: 1945/08/21 (71 y.o. Male) Treating RN: Adam Merritt Primary Care Provider: Clayborn Merritt Other Clinician: Referring Provider: Clayborn Merritt Treating Provider/Extender: Adam Merritt in Treatment: 3 History of Present Illness Location: bilateral lower leg edema with some blisters recently and weeping of fluid left side more than right Quality: Patient reports experiencing a dull pain to affected area(s). Severity: Patient states wound are getting worse. Duration: Patient has had the wound for > 3 months prior to seeking treatment at the wound center Timing: Pain in wound is Intermittent (comes and goes Context: The wound appeared gradually over time Modifying Factors: Consults to this date include: he has been seen by his PCP and she sent him off to see [place]. Associated Signs and Symptoms: Patient reports having difficulty standing for long  periods. HPI Description: 71 year old gentleman seen earlier at our clinic in June 2016 and again in June 2017 is noncompliant with recommendations and does not follow-up regularly. 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 He had referred him to the lymphedema clinic and he was accepted but he did not follow-up with them. At that stage he had agreed to get a repeat arterial study done and he has seen Adam Merritt in [place]. This time around he has been seen by his PCP Adam Merritt who referred him urgently to [place] for bilateral lower extremity cellulitis in spite of being on antibiotics -- he was placed on Keflex and 500 mg 3 times a day for 10 days. His comorbidities include coronary artery disease, diabetes mellitus, hypertension, Bell's palsy and chronic lymphedema. he was also recently admitted to the hospital on April 13 and kept overnight with the diagnosis of a TIA and a left ICA stenosis to follow-up with vascular surgery as an outpatient 10/05/2016 -- patient has been compliant this week and left his compression and dressing on for the entire week and had home health change it appropriately. ==== Old Notes: 71 year old gentleman who comes with bilateral lower limb edema for over 20 years and recent attack of blisters and redness for about 2 weeks. The patient says he has had bilateral lower limb edema since 1996. He has been noncompliant with wearing wraps in the past and even was given a boot to wear at night which she never tolerated and didn't wear it. He has a past medical history of sleep apnea, neuropathy with diabetes, hypertension, hyperlipidemia, coronary artery disease, varicose veins, carotid artery disease and is status post  CABG and cholecystectomy. He was  seen by the vascular surgeon on 09/29/2013 and Adam Merritt opinion was:"I have reviewed his Adam Merritt ([id number]) outside ultrasound which shows an ankle-brachial index of 0.72 on the left and 1.0 on the right.o This study was repeated at our office.o The ankle-brachial index on the right was 1.26 with triphasic waveforms.o On the left it was a 1.04 with biphasic waveforms.o The patient had a toe pressure of 121 on the right and 100 on the left. In summary, I would not recommend any further imaging , as I do not think his symptoms are related to arterial insufficiency, but rather secondary to diabetic neuropathy.o The patient still has room for increasing his Neurontin dose, which he states does help him.o I have scheduled him to come back in one year for a repeat arterial evaluation." in June 2016, a year ago, he has a venous duplex study to be done. He was also going to be accepted to the lymphedema clinic but I did not believe the patient went regularly for this. ==== Electronic Signature(s) Signed: 10/23/2016 10:48:29 AM By: Adam Fudge MD, FACS Entered By: Adam Merritt on 10/23/2016 10:48:28 Adam Merritt ([id number]) -------------------------------------------------------------------------------- Physical Exam Details Patient Name: Adam Merritt Date of Service: 10/23/2016 9:45 AM Medical Record Number: [id number] Patient Account Number: [id number] Date of Birth/Sex: [birth year] (71 y.o. Male) Treating RN: Adam Merritt Primary Care Provider: Clayborn Merritt Other Clinician: Referring Provider: Clayborn Merritt Treating Provider/Extender: Adam Merritt in Treatment: 3 Constitutional . Pulse regular. Respirations normal and unlabored. Afebrile. . Eyes Nonicteric. Reactive to light. Ears, Nose, Mouth, and Throat Lips, teeth, and gums WNL.Marland Kitchen Moist mucosa without lesions. Neck supple and nontender. No palpable supraclavicular or cervical adenopathy. Normal sized without  goiter. Respiratory WNL. No retractions.. Breath sounds WNL, No rubs, rales, rhonchi, or wheeze.. Cardiovascular Heart rhythm and rate regular, no murmur or gallop.. Pedal Pulses WNL. No clubbing, cyanosis or edema. Chest Breasts symmetical and no nipple discharge.. Breast tissue WNL, no masses, lumps, or tenderness.. Lymphatic No adneopathy. No adenopathy. No adenopathy. Musculoskeletal Adexa without tenderness or enlargement.. Digits and nails w/o clubbing, cyanosis, infection, petechiae, ischemia, or inflammatory conditions.. Integumentary (Hair, Skin) No suspicious lesions. No crepitus or fluctuance. No peri-wound warmth or erythema. No masses.Marland Kitchen Psychiatric Judgement and insight Intact.. No evidence of depression, anxiety, or agitation.. Notes the right leg has healed and there is no open ulceration. The left leg continues to have a lot of weeping and "micro-perforations. No sharp debridement was required today. Electronic Signature(s) Signed: 10/23/2016 10:49:01 AM By: Adam Fudge MD, FACS Entered By: Adam Merritt on 10/23/2016 10:49:00 Adam Merritt ([id number]) -------------------------------------------------------------------------------- Physician Orders Details Patient Name: Adam Merritt Date of Service: 10/23/2016 9:45 AM Medical Record Number: [id number] Patient Account Number: [id number] Date of Birth/Sex: Apr 23, 1946 (71 y.o. Male) Treating RN: Montey Hora Primary Care Provider: Clayborn Merritt Other Clinician: Referring Provider: Clayborn Merritt Treating Provider/Extender: Adam Merritt in Treatment: 3 Verbal / Phone Orders: No Diagnosis Coding Wound Cleansing Wound #2 Left,Circumferential Lower Leg o Clean wound with Normal Saline. o Cleanse wound with mild soap and water Wound #3 Right Lower Leg o Clean wound with Normal Saline. o Cleanse wound with mild soap and water Primary Wound Dressing Wound #2 Left,Circumferential Lower Leg o Aquacel  Ag Secondary Dressing Wound #2 Left,Circumferential Lower Leg o ABD pad o XtraSorb - HHRN PLEASE PROVIDE XTRASORB FOR PATIENT Dressing Change Frequency Wound #2 Left,Circumferential Lower Leg o Three times weekly Follow-up Appointments Wound #  2 Left,Circumferential Lower Leg o Return Appointment in 1 week. Wound #3 Right Lower Leg o Return Appointment in 1 week. Edema Control Wound #2 Left,Circumferential Lower Leg o Kerlix and Coban - Bilateral - may anchor with unna paste Wound #3 Right Lower Leg o Patient to wear own Juxtalite/Juzo compression garment. - HHRN to teach patient and spouse how to apply juxtalite once available Adam Merritt, Adam Merritt ([id number]) Additional Orders / Instructions Wound #2 Left,Circumferential Lower Leg o Increase protein intake. o Other: - please work to keep blood sugars below 180 Wound #3 Right Lower Leg o Increase protein intake. o Other: - please work to keep blood sugars below [place] #2 Left,Circumferential Lower Leg o [place] Visits - [place] Nurse may visit PRN to address patientos wound care needs. o FACE TO FACE ENCOUNTER: MEDICARE and MEDICAID PATIENTS: I certify that this patient is under my care and that I had a face-to-face encounter that meets the physician face-to-face encounter requirements with this patient on this date. The encounter with the patient was in whole or in part for the following MEDICAL CONDITION: (primary reason for [place]) MEDICAL NECESSITY: I certify, that based on my findings, NURSING services are a medically necessary home health service. HOME BOUND STATUS: I certify that my clinical findings support that this patient is homebound (i.e., Due to illness or injury, pt requires aid of supportive devices such as crutches, cane, wheelchairs, walkers, the use of special transportation or the assistance of another person to leave their place of  residence. There is a normal inability to leave the home and doing so requires considerable and taxing effort. Other absences are for medical reasons / religious services and are infrequent or of short duration when for other reasons). o If current dressing causes regression in wound condition, may D/C ordered dressing product/s and apply Normal Saline Moist Dressing daily until next [place] / Other MD appointment. [place] of regression in wound condition at [phone]. o Please direct any NON-WOUND related issues/requests for orders to patient's Primary Care Physician Wound #3 Right Lower Leg o [place] Visits - [place] Nurse may visit PRN to address patientos wound care needs. o FACE TO FACE ENCOUNTER: MEDICARE and MEDICAID PATIENTS: I certify that this patient is under my care and that I had a face-to-face encounter that meets the physician face-to-face encounter requirements with this patient on this date. The encounter with the patient was in whole or in part for the following MEDICAL CONDITION: (primary reason for [place]) MEDICAL NECESSITY: I certify, that based on my findings, NURSING services are a medically necessary home health service. HOME BOUND STATUS: I certify that my clinical findings support that this patient is homebound (i.e., Due to illness or injury, pt requires aid of supportive devices such as crutches, cane, wheelchairs, walkers, the use of special transportation or the assistance of another person to leave their place of residence. There is a normal inability to leave the home and doing so requires considerable and taxing effort. Other absences are for medical reasons / religious services and are infrequent or of short duration when for other reasons). Adam Merritt, Adam Merritt ([id number]) o If current dressing causes regression in wound condition, may D/C ordered dressing product/s and apply  Normal Saline Moist Dressing daily until next [place] / Other MD appointment. [place] of regression in wound condition at [phone]. o Please direct any  NON-WOUND related issues/requests for orders to patient's Primary Care Physician Electronic Signature(s) Signed: 10/23/2016 4:05:08 PM By: Adam Fudge MD, FACS Signed: 10/23/2016 4:49:11 PM By: Montey Hora Entered By: Montey Hora on 10/23/2016 10:44:22 Adam Merritt ([id number]) -------------------------------------------------------------------------------- Problem List Details Patient Name: Adam Merritt Date of Service: 10/23/2016 9:45 AM Medical Record Number: [id number] Patient Account Number: [id number] Date of Birth/Sex: 08/22/1945 (71 y.o. 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 Male) Treating RN: Adam Merritt Primary Care  Provider: Clayborn Merritt Other Clinician: Referring Provider: Clayborn Merritt Treating Provider/Extender: Adam Merritt in Treatment: 3 Subjective Chief Complaint Information obtained from Patient the patient comes along with his wife for excessive swelling of both lower extremities left more than right and weeping of fluid for over 2 months now History of Present Illness (HPI) The following HPI elements were documented for the patient's wound: Location: bilateral lower leg edema with some blisters recently and weeping of fluid left side more than right Quality: Patient reports experiencing a dull pain to affected area(s). Severity: Patient states wound are getting worse. Duration: Patient has had the wound for > 3 months prior to seeking treatment at the wound center Timing: Pain in wound is Intermittent (comes and goes Context: The wound appeared gradually over time Modifying Factors: Consults to this date include: he has been seen by his PCP and she sent him off to see [place]. Associated Signs and Symptoms: Patient reports having difficulty standing for long periods. 71 year old gentleman seen earlier at our clinic in June 2016 and again in June 2017 is noncompliant with recommendations and does not follow-up regularly. 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 He had referred him to the lymphedema clinic and he was accepted but he did not follow-up with them. At that stage he had agreed to get a repeat arterial study done and he has seen Adam Merritt in [place]. This time around he has been seen by his PCP Adam Merritt who referred him urgently to [place] for bilateral lower extremity cellulitis in spite of being on antibiotics -- he was placed on Keflex and 500 mg 3 times a day for  10 days. His comorbidities include coronary artery disease, diabetes mellitus, hypertension, Bell's palsy and chronic lymphedema. he was also recently admitted to the hospital on April 13 and kept overnight with the diagnosis of a TIA and a left ICA stenosis to follow-up with vascular surgery as an outpatient 10/05/2016 -- patient has been compliant this week  and left his compression and dressing on for the entire week and had home health change it appropriately. ==== Old Notes: 71 year old gentleman who comes with bilateral lower limb edema for over 20 years and recent attack of blisters and redness for about 2 weeks. Adam Merritt, Adam Merritt ([id number]) The patient says he has had bilateral lower limb edema since 1996. He has been noncompliant with wearing wraps in the past and even was given a boot to wear at night which she never tolerated and didn't wear it. He has a past medical history of sleep apnea, neuropathy with diabetes, hypertension, hyperlipidemia, coronary artery disease, varicose veins, carotid artery disease and is status post CABG and cholecystectomy. He was seen by the vascular surgeon on 09/29/2013 and Adam Merritt opinion was:"I have reviewed his outside ultrasound which shows an ankle-brachial index of 0.72 on the left and 1.0 on the right. This study was repeated at our office. The ankle-brachial index on the right was 1.26 with triphasic waveforms. On the left it was a 1.04 with biphasic waveforms. The patient had a toe pressure of 121 on the right and 100 on the left. In summary, I would not recommend any further imaging , as I do not think his symptoms are related to arterial insufficiency, but rather secondary to diabetic neuropathy. The patient still has room for increasing his Neurontin dose, which he states does help him. I have scheduled him to come back in one year for a repeat arterial evaluation." in June 2016, a year ago, he has a venous duplex study to be done. He  was also going to be accepted to the lymphedema clinic but I did not believe the patient went regularly for this. ==== Objective Constitutional Pulse regular. Respirations normal and unlabored. Afebrile. Vitals Time Taken: 10:18 AM, Height: 65 in, Weight: 336 lbs, BMI: 55.9, Temperature: 98.2 F, Pulse: 88 bpm, Respiratory Rate: 20 breaths/min, Blood Pressure: 128/71 mmHg. Eyes Nonicteric. Reactive to light. Ears, Nose, Mouth, and Throat Lips, teeth, and gums WNL.Marland Kitchen Moist mucosa without lesions. Neck supple and nontender. No palpable supraclavicular or cervical adenopathy. Normal sized without goiter. Respiratory WNL. No retractions.. Breath sounds WNL, No rubs, rales, rhonchi, or wheeze.. Cardiovascular Heart rhythm and rate regular, no murmur or gallop.. Pedal Pulses WNL. No clubbing, cyanosis or edema. Chest Breasts symmetical and no nipple discharge.. Breast tissue WNL, no masses, lumps, or tenderness.Adam Merritt, Adam Merritt ([id number]) Lymphatic No adneopathy. No adenopathy. No adenopathy. Musculoskeletal Adexa without tenderness or enlargement.. Digits and nails w/o clubbing, cyanosis, infection, petechiae, ischemia, or inflammatory conditions.Marland Kitchen Psychiatric Judgement and insight Intact.. No evidence of depression, anxiety, or agitation.. General Notes: the right leg has healed and there is no open ulceration. The left leg continues to have a lot of weeping and "micro-perforations. No sharp debridement was required today. Integumentary (Hair, Skin) No suspicious lesions. No crepitus or fluctuance. No peri-wound warmth or erythema. No masses.. Wound #2 status is Open. Original cause of wound was Gradually Appeared. The wound is located on the Left,Circumferential Lower Leg. The wound measures 13cm length x 57.8cm width x 0.1cm depth; 590.148cm^2 area and 59.015cm^3 volume. The wound is limited to skin breakdown. There is no tunneling or undermining noted. There is a large amount of  serosanguineous drainage noted. The wound margin is flat and intact. There is medium (34-66%) pink granulation within the wound bed. There is a medium (34- 66%) amount of necrotic tissue within the wound bed including Adherent Slough. The periwound skin appearance exhibited: Maceration. The  periwound skin appearance did not exhibit: Callus, Crepitus, Excoriation, Induration, Rash, Scarring, Dry/Scaly, Atrophie Blanche, Cyanosis, Ecchymosis, Hemosiderin Staining, Mottled, Pallor, Rubor, Erythema. Periwound temperature was noted as No Abnormality. The periwound has tenderness on palpation. Wound #3 status is Open. Original cause of wound was Gradually Appeared. The wound is located on the Right Lower Leg. The wound measures 0.1cm length x 0.1cm width x 0.1cm depth; 0.008cm^2 area and 0.001cm^3 volume. The wound is limited to skin breakdown. There is no tunneling or undermining noted. There is a large amount of serous drainage noted. The wound margin is flat and intact. There is medium (34-66%) red granulation within the wound bed. There is a medium (34-66%) amount of necrotic tissue within the wound bed. The periwound skin appearance did not exhibit: Callus, Crepitus, Excoriation, Induration, Rash, Scarring, Dry/Scaly, Maceration, Atrophie Blanche, Cyanosis, Ecchymosis, Hemosiderin Staining, Mottled, Pallor, Rubor, Erythema. Periwound temperature was noted as No Abnormality. The periwound has tenderness on palpation. 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 Cleanse wound with mild soap and water Wound #3 Right Lower Leg: Clean wound with Normal Saline. Cleanse wound with mild soap and water Primary Wound Dressing: Wound #2 Left,Circumferential Lower Leg: Aquacel Ag Secondary Dressing: Wound #2 Left,Circumferential Lower Leg: ABD pad XtraSorb - HHRN PLEASE PROVIDE XTRASORB FOR PATIENT Dressing Change Frequency: Wound #2 Left,Circumferential Lower Leg: Three times weekly Follow-up Appointments: Wound #2 Left,Circumferential Lower Leg: Return Appointment in 1 week. Wound #3 Right Lower Leg: Return Appointment in 1 week. Edema Control: Wound #2 Left,Circumferential Lower Leg: Kerlix and Coban - Bilateral - may anchor with unna paste Wound #3 Right Lower Leg: Patient to wear own Juxtalite/Juzo compression garment. - HHRN to teach patient and spouse how to apply juxtalite once available Additional Orders / Instructions: Wound #2 Left,Circumferential Lower Leg: Increase protein intake. Other: - please work to keep blood sugars below 180 Wound #3 Right Lower Leg: Increase protein intake. Other: - please work to keep blood sugars below [place]: Wound #2 Left,Circumferential Lower Leg: Continue Home Health Visits - [place] Nurse may visit PRN to address patient s wound care needs. FACE TO FACE ENCOUNTER: MEDICARE and MEDICAID PATIENTS: I certify that this patient is under Adam Merritt, Adam Merritt ([id number]) my care and that I had a face-to-face encounter that meets the physician face-to-face encounter requirements with this patient on this date. The encounter with the patient was in whole or in part for the following MEDICAL CONDITION: (primary reason for [place]) MEDICAL NECESSITY: I certify, that based on my findings, NURSING services are a medically necessary home health service. HOME BOUND STATUS: I certify that my clinical findings support that this patient is homebound (i.e., Due to illness or injury,  pt requires aid of supportive devices such as crutches, cane, wheelchairs, walkers, the use of special transportation or the assistance of another person to leave their place of residence. There is a normal inability to leave the home and doing so requires considerable and taxing effort. Other absences are for medical reasons / religious services and are infrequent or of short duration when for other reasons). If current dressing  causes regression in wound condition, may D/C ordered dressing product/s and apply Normal Saline Moist Dressing daily until next [place] / Other MD appointment. [place] of regression in wound condition at [phone]. Please direct any NON-WOUND related issues/requests for orders to patient's Primary Care Physician Wound #3 Right Lower Leg: [place] Visits - [place] Nurse may visit PRN to address patient s wound care needs. FACE TO FACE ENCOUNTER: MEDICARE and MEDICAID PATIENTS: I certify that this patient is under my care and that I had a face-to-face encounter that meets the physician face-to-face encounter requirements with this patient on this date. The encounter with the patient was in whole or in part for the following MEDICAL CONDITION: (primary reason for [place]) MEDICAL NECESSITY: I certify, that based on my findings, NURSING services are a medically necessary home health service. HOME BOUND STATUS: I certify that my clinical findings support that this patient is homebound (i.e., Due to illness or injury, pt requires aid of supportive devices such as crutches, cane, wheelchairs, walkers, the use of special transportation or the assistance of another person to leave their place of residence. There is a normal inability to leave the home and doing so requires considerable and taxing effort. Other absences are for medical reasons / religious services and are infrequent or of short duration when for  other reasons). If current dressing causes regression in wound condition, may D/C ordered dressing product/s and apply Normal Saline Moist Dressing daily until next [place] / Other MD appointment. [place] of regression in wound condition at [phone]. Please direct any NON-WOUND related issues/requests for orders to patient's Primary Care Physician his right leg has healed and we will continue with compression to the left lower extremity. I have recommended: 1. Silver alginate and a light Kerlix and Coban dressing to the left lower extremities. 2. order juxta lites for the right lower extremity 3. Good control of his diabetes mellitus 4. Elevation and exercise. Electronic Signature(s) Signed: 10/23/2016 10:50:22 AM By: Adam Fudge MD, FACS Entered By: Adam Merritt on 10/23/2016 10:50:22 Adam Merritt ([id number]) -------------------------------------------------------------------------------- SuperBill Details Patient Name: Adam Merritt Date of Service: 10/23/2016 Medical Record Number: [id number] Patient Account Number: [id number] Date of Birth/Sex: [birth year] (71 y.o. 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 Facility Procedures CPT4 Code: [id number] Description: 99213 - WOUND CARE VISIT-LEV 3 EST PT Modifier: Quantity: 1 Physician Procedures CPT4 Code Description: [id number] - WC PHYS LEVEL 3 - EST PT ICD-10 Description Diagnosis E11.620 Type 2  diabetes mellitus with diabetic dermatitis I89.0 Lymphedema, not elsewhere classified L97.221 Non-pressure chronic ulcer of left calf limited to  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

## 2016-11-02 ENCOUNTER — Encounter: Payer: Medicare Other | Admitting: Surgery

## 2016-11-02 DIAGNOSIS — E11620 Type 2 diabetes mellitus with diabetic dermatitis: Secondary | ICD-10-CM | POA: Diagnosis not present

## 2016-11-03 NOTE — Progress Notes (Signed)
Adam, Merritt ([id number]) Visit Report for 11/02/2016 Arrival Information Details Patient Name: Adam Merritt, Adam Merritt Date of Service: 11/02/2016 9:45 AM Medical Record Number: [id number] Patient Account Number: [id number] Date of Birth/Sex: 07-26-1945 (71 y.o. Male) Treating RN: Ahmed Prima Primary Care Mimi Debellis: [name] Other Clinician: Referring Ishi Danser: [name] Treating Kylinn Shropshire/Extender: Frann Rider in Treatment: 5 Visit Information History Since Last Visit All ordered tests and consults were completed: No Patient Arrived: [name] Added or deleted any medications: No Arrival Time: 09:40 Any new allergies or adverse reactions: No Accompanied By: wife Had a fall or experienced change in No Transfer Assistance: EasyPivot Patient activities of daily living that may affect Lift risk of falls: Patient Identification Verified: Yes Signs or symptoms of abuse/neglect since last No Secondary Verification Process Yes visito Completed: Hospitalized since last visit: No Patient Requires Transmission- No Has Dressing in Place as Prescribed: Yes Based Precautions: Has Compression in Place as Prescribed: Yes Patient Has Alerts: Yes Pain Present Now: No Patient Alerts: DMII ABI [place] BILATERAL >220 Electronic Signature(s) Signed: 11/02/2016 4:16:11 PM By: Alric Quan Entered By: Alric Quan on 11/02/2016 09:44:56 Adam Merritt ([id number]) -------------------------------------------------------------------------------- Clinic Level of Care Assessment Details Patient Name: Adam Merritt Date of Service: 11/02/2016 9:45 AM Medical Record Number: [id number] Patient Account Number: [id number] Date of Birth/Sex: 1946/01/29 (71 y.o. Male) Treating RN: Ahmed Prima Primary Care Terasa Orsini: [name] Other Clinician: Referring Sayid Moll: [name] Treating Charlcie Prisco/Extender: Frann Rider in Treatment: 5 Clinic Level of Care Assessment Items TOOL 4  Quantity Score X - Use when only an EandM is performed on FOLLOW-UP visit 1 0 ASSESSMENTS - Nursing Assessment / Reassessment X - Reassessment of Co-morbidities (includes updates in patient status) 1 10 X - Reassessment of Adherence to Treatment Plan 1 5 ASSESSMENTS - Wound and Skin Assessment / Reassessment []  - Simple Wound Assessment / Reassessment - one wound 0 X - Complex Wound Assessment / Reassessment - multiple wounds 2 5 []  - Dermatologic / Skin Assessment (not related to wound area) 0 ASSESSMENTS - Focused Assessment X - Circumferential Edema Measurements - multi extremities 2 5 []  - Nutritional Assessment / Counseling / Intervention 0 []  - Lower Extremity Assessment (monofilament, tuning fork, pulses) 0 []  - Peripheral Arterial Disease Assessment (using hand held doppler) 0 ASSESSMENTS - Ostomy and/or Continence Assessment and Care []  - Incontinence Assessment and Management 0 []  - Ostomy Care Assessment and Management (repouching, etc.) 0 PROCESS - Coordination of Care []  - Simple Patient / Family Education for ongoing care 0 X - Complex (extensive) Patient / Family Education for ongoing care 1 20 X - Staff obtains Programmer, systems, Records, Test Results / Process Orders 1 10 X - Staff telephones HHA, Nursing Homes / Clarify orders / etc 1 10 []  - Routine Transfer to another Facility (non-emergent condition) 0 [facility], [facility] ([id number]) []  - Routine Hospital Admission (non-emergent condition) 0 []  - New Admissions / Biomedical engineer / Ordering NPWT, Apligraf, etc. 0 []  - Emergency Hospital Admission (emergent condition) 0 X - Simple Discharge Coordination 1 10 []  - Complex (extensive) Discharge Coordination 0 PROCESS - Special Needs []  - Pediatric / Minor Patient Management 0 []  - Isolation Patient Management 0 []  - Hearing / Language / Visual special needs 0 []  - Assessment of Community assistance (transportation, D/C planning, etc.) 0 []  - Additional assistance /  Altered mentation 0 []  - Support Surface(s) Assessment (bed, cushion, seat, etc.) 0 INTERVENTIONS - Wound Cleansing / Measurement []  - Simple Wound Cleansing -  one wound 0 X - Complex Wound Cleansing - multiple wounds 2 5 X - Wound Imaging (photographs - any number of wounds) 1 5 []  - Wound Tracing (instead of photographs) 0 []  - Simple Wound Measurement - one wound 0 X - Complex Wound Measurement - multiple wounds 2 5 INTERVENTIONS - Wound Dressings []  - Small Wound Dressing one or multiple wounds 0 []  - Medium Wound Dressing one or multiple wounds 0 X - Large Wound Dressing one or multiple wounds 2 20 X - Application of Medications - topical 1 5 []  - Application of Medications - injection 0 INTERVENTIONS - Miscellaneous []  - External ear exam 0 ZORAWAR, STROLLO ([id number]) []  - Specimen Collection (cultures, biopsies, blood, body fluids, etc.) 0 []  - Specimen(s) / Culture(s) sent or taken to Lab for analysis 0 []  - Patient Transfer (multiple staff / Civil Service fast streamer / Similar devices) 0 []  - Simple Staple / Suture removal (25 or less) 0 []  - Complex Staple / Suture removal (26 or more) 0 []  - Hypo / Hyperglycemic Management (close monitor of Blood Glucose) 0 []  - Ankle / Brachial Index (ABI) - do not check if billed separately 0 X - Vital Signs 1 5 Has the patient been seen at the hospital within the last three years: Yes Total Score: 160 Level Of Care: New/Established - Level 5 Electronic Signature(s) Signed: 11/02/2016 4:16:11 PM By: Alric Quan Entered By: Alric Quan on 11/02/2016 11:14:56 Adam Merritt ([id number]) -------------------------------------------------------------------------------- Encounter Discharge Information Details Patient Name: Adam Merritt Date of Service: 11/02/2016 9:45 AM Medical Record Number: [id number] Patient Account Number: [id number] Date of Birth/Sex: December 20, 1945 (71 y.o. Male) Treating RN: Ahmed Prima Primary Care Mussa Groesbeck: [name] Other Clinician: Referring Ivalene Platte: [name] Treating Winefred Hillesheim/Extender: Frann Rider in Treatment: 5 Encounter Discharge Information Items Discharge Pain Level: 0 Discharge Condition: Stable Ambulatory Status: Cane Discharge Destination: Home Transportation: Private Auto Accompanied By: wife Schedule Follow-up Appointment: Yes Medication Reconciliation completed No and provided to Patient/Care Autumnrose Yore: Provided on Clinical Summary of Care: 11/02/2016 Form Type Recipient Paper Patient JT Electronic Signature(s) Signed: 11/02/2016 10:18:28 AM By: Ruthine Dose Entered By: Ruthine Dose on 11/02/2016 10:18:28 Adam Merritt ([id number]) -------------------------------------------------------------------------------- Lower Extremity Assessment Details Patient Name: Adam Merritt Date of Service: 11/02/2016 9:45 AM Medical Record Number: [id number] Patient Account Number: [id number] Date of Birth/Sex: Aug 22, 1945 (71 y.o. Male) Treating RN: Carolyne Fiscal, Debi Primary Care Auna Mikkelsen: [name] Other Clinician: Referring Jamir Rone: [name] Treating Marsean Elkhatib/Extender: Frann Rider in Treatment: 5 Edema Assessment Assessed: [Left: No] [Right: No] E[Left: dema] [Right: :] Calf Left: Right: Point of Measurement: 34 cm From Medial Instep 62.3 cm 56.5 cm Ankle Left: Right: Point of Measurement: 12 cm From Medial Instep 43 cm 33.5 cm Vascular Assessment Pulses: Dorsalis Pedis Palpable: [Left:Yes] [Right:Yes] Posterior Tibial Extremity colors, hair growth, and conditions: Extremity Color: [Left:Hyperpigmented] [Right:Hyperpigmented] Temperature of Extremity: [Left:Warm] [Right:Warm] Capillary Refill: [Left:< 3 seconds] [Right:< 3 seconds] Electronic Signature(s) Signed: 11/02/2016 4:16:11 PM By: Alric Quan Entered By: Alric Quan on 11/02/2016 10:01:37 Adam Merritt  ([id number]) -------------------------------------------------------------------------------- Multi Wound Chart Details Patient Name: Adam Merritt Date of Service: 11/02/2016 9:45 AM Medical Record Number: [id number] Patient Account Number: [id number] Date of Birth/Sex: [birth year] (71 y.o. Male) Treating RN: Ahmed Prima Primary Care Wylie Coon: [name] Other Clinician: Referring Hilberto Burzynski: [name] Treating Javonnie Illescas/Extender: Frann Rider in Treatment: 5 Vital Signs Height(in): 65 Pulse(bpm): 85 Weight(lbs): 336 Blood Pressure 173/78 (mmHg): Body Mass Index(BMI): 56 Temperature(F): 98.1 Respiratory Rate  18 (breaths/min): Photos: [2:No Photos] [3:No Photos] [N/A:N/A] Wound Location: [2:Left Lower Leg - Circumfernential] [3:Right Lower Leg] [N/A:N/A] Wounding Event: [2:Gradually Appeared] [3:Gradually Appeared] [N/A:N/A] Primary Etiology: [2:Lymphedema] [3:Lymphedema] [N/A:N/A] Comorbid History: [2:Anemia, Lymphedema, Chronic Obstructive Pulmonary Disease (COPD), Sleep Apnea, Congestive Heart Failure, Coronary Artery Disease, Hypertension, Peripheral Venous Disease, Type II Diabetes, Osteoarthritis, Neuropathy, Received  Chemotherapy] [3:Anemia, Lymphedema, Chronic Obstructive Pulmonary Disease (COPD), Sleep Apnea, Congestive Heart Failure, Coronary Artery Disease, Hypertension, Peripheral Venous Disease, Type II Diabetes, Osteoarthritis, Neuropathy, Received  Chemotherapy] [N/A:N/A] Date Acquired: [2:07/10/2016] [3:07/10/2016] [N/A:N/A] Weeks of Treatment: [2:5] [3:5] [N/A:N/A] Wound Status: [2:Open] [3:Open] [N/A:N/A] Measurements L x W x D 8.5x56x0.1 [3:0.1x0.1x0.1] [N/A:N/A] (cm) Area (cm) : [2:373.85] [3:0.008] [N/A:N/A] Volume (cm) : [2:37.385] [3:0.001] [N/A:N/A] % Reduction in Area: [2:-16.70%] [3:99.80%] [N/A:N/A] % Reduction in Volume: -16.70% [3:99.80%] [N/A:N/A] Classification: [2:Partial Thickness] [3:Partial Thickness] [N/A:N/A] HBO Classification:  [2:Grade 1] [3:Grade 1] [N/A:N/A] Exudate Amount: [2:Large] [3:Large] [N/A:N/A] Exudate Type: [2:Serous] [3:Serous] [N/A:N/A] Exudate Color: [2:amber] [3:amber] [N/A:N/A] Foul Odor After Yes No N/A Cleansing: Odor Anticipated Due to No N/A N/A Product Use: Wound Margin: Flat and Intact Flat and Intact N/A Granulation Amount: Medium (34-66%) Medium (34-66%) N/A Granulation Quality: Pink Red N/A Necrotic Amount: Medium (34-66%) Medium (34-66%) N/A Exposed Structures: Fascia: No Fascia: No N/A Fat Layer (Subcutaneous Fat Layer (Subcutaneous Tissue) Exposed: No Tissue) Exposed: No Tendon: No Tendon: No Muscle: No Muscle: No Joint: No Joint: No Bone: No Bone: No Limited to Skin Limited to Skin Breakdown Breakdown Epithelialization: Small (1-33%) None N/A Periwound Skin Texture: Excoriation: No Excoriation: No N/A Induration: No Induration: No Callus: No Callus: No Crepitus: No Crepitus: No Rash: No Rash: No Scarring: No Scarring: No Periwound Skin Maceration: Yes Maceration: No N/A Moisture: Dry/Scaly: No Dry/Scaly: No Periwound Skin Color: Atrophie Blanche: No Atrophie Blanche: No N/A Cyanosis: No Cyanosis: No Ecchymosis: No Ecchymosis: No Erythema: No Erythema: No Hemosiderin Staining: No Hemosiderin Staining: No Mottled: No Mottled: No Pallor: No Pallor: No Rubor: No Rubor: No Temperature: No Abnormality No Abnormality N/A Tenderness on Yes Yes N/A Palpation: Wound Preparation: Ulcer Cleansing: Ulcer Cleansing: N/A Rinsed/Irrigated with Rinsed/Irrigated with Saline Saline Topical Anesthetic Topical Anesthetic Applied: Other: lidocaine Applied: Other: lidocaine 4% 4% Treatment Notes Wound #2 (Left, Circumferential Lower Leg) 1. Cleansed with: Clean wound with Normal Saline Cleanse wound with antibacterial soap and water CHING, RABIDEAU. ([id number]) 2. Anesthetic Topical Lidocaine 4% cream to wound bed prior to debridement 4. Dressing  Applied: Aquacel Ag 5. Secondary Dressing Applied ABD Pad Dry Gauze 7. Secured with Tape Notes kerlix. coban, xtrasorb Wound #3 (Right Lower Leg) 1. Cleansed with: Clean wound with Normal Saline Cleanse wound with antibacterial soap and water 2. Anesthetic Topical Lidocaine 4% cream to wound bed prior to debridement 4. Dressing Applied: Aquacel Ag 5. Secondary Dressing Applied ABD Pad Dry Gauze 7. Secured with Tape Notes kerlix. Myrle Sheng, xtrasorb Engineer, maintenance) Signed: 11/02/2016 11:10:10 AM By: Christin Fudge MD, FACS Entered By: Christin Fudge on 11/02/2016 11:10:10 Adam Merritt ([id number]) -------------------------------------------------------------------------------- [place] Details Patient Name: Adam Merritt Date of Service: 11/02/2016 9:45 AM Medical Record Number: [id number] Patient Account Number: [id number] Date of Birth/Sex: 07-17-1945 (71 y.o. Male) Treating RN: Ahmed Prima Primary Care Tamar Miano: [name] Other Clinician: Referring Calani Gick: [name] Treating Ottie Tillery/Extender: Frann Rider in Treatment: 5 Active Inactive ` Abuse / Safety / Falls / Self Care Management Nursing Diagnoses: Impaired physical mobility Potential for falls Goals: Patient will remain injury free Date Initiated: 09/28/2016 Target Resolution Date:  12/08/2016 Goal Status: Active Interventions: Assess fall risk on admission and as needed Notes: ` Orientation to the Wound Care Program Nursing Diagnoses: Knowledge deficit related to the wound healing center program Goals: Patient/caregiver will verbalize understanding of the [place] Program Date Initiated: 09/28/2016 Target Resolution Date: 12/08/2016 Goal Status: Active Interventions: Provide education on orientation to the wound center Notes: ` Wound/Skin Impairment Nursing Diagnoses: Impaired tissue integrity ANDERSSON, LARRABEE  ([id number]) Goals: Patient/caregiver will verbalize understanding of skin care regimen Date Initiated: 09/28/2016 Target Resolution Date: 12/08/2016 Goal Status: Active Ulcer/skin breakdown will have a volume reduction of 30% by week 4 Date Initiated: 09/28/2016 Target Resolution Date: 12/08/2016 Goal Status: Active Ulcer/skin breakdown will have a volume reduction of 50% by week 8 Date Initiated: 09/28/2016 Target Resolution Date: 12/08/2016 Goal Status: Active Ulcer/skin breakdown will have a volume reduction of 80% by week 12 Date Initiated: 09/28/2016 Target Resolution Date: 12/08/2016 Goal Status: Active Ulcer/skin breakdown will heal within 14 weeks Date Initiated: 09/28/2016 Target Resolution Date: 12/09/2016 Goal Status: Active Interventions: Assess patient/caregiver ability to obtain necessary supplies Assess patient/caregiver ability to perform ulcer/skin care regimen upon admission and as needed Assess ulceration(s) every visit Notes: Electronic Signature(s) Signed: 11/02/2016 4:16:11 PM By: Alric Quan Entered By: Alric Quan on 11/02/2016 10:01:42 Adam Merritt ([id number]) -------------------------------------------------------------------------------- Pain Assessment Details Patient Name: Adam Merritt Date of Service: 11/02/2016 9:45 AM Medical Record Number: [id number] Patient Account Number: [id number] Date of Birth/Sex: 1945-12-17 (71 y.o. Male) Treating RN: Ahmed Prima Primary Care Maram Bently: [name] Other Clinician: Referring Brody Bonneau: [name] Treating Mandela Bello/Extender: Frann Rider in Treatment: 5 Active Problems Location of Pain Severity and Description of Pain Patient Has Paino No Site Locations With Dressing Change: No Pain Management and Medication Current Pain Management: Electronic Signature(s) Signed: 11/02/2016 4:16:11 PM By: Alric Quan Entered By: Alric Quan on 11/02/2016 09:45:01 Adam Merritt  ([id number]) -------------------------------------------------------------------------------- Patient/Caregiver Education Details Patient Name: Adam Merritt Date of Service: 11/02/2016 9:45 AM Medical Record Number: [id number] Patient Account Number: [id number] Date of Birth/Gender: 10/07/1945 (71 y.o. Male) Treating RN: Ahmed Prima Primary Care Physician: [name] Other Clinician: Referring Physician: [name] Treating Physician/Extender: Frann Rider in Treatment: 5 Education Assessment Education Provided To: Patient Education Topics Provided Wound/Skin Impairment: Handouts: Other: change dressing as ordered Methods: Demonstration, Explain/Verbal Responses: State content correctly Electronic Signature(s) Signed: 11/02/2016 4:16:11 PM By: Alric Quan Entered By: Alric Quan on 11/02/2016 09:59:29 Adam Merritt ([id number]) -------------------------------------------------------------------------------- Wound Assessment Details Patient Name: Adam Merritt Date of Service: 11/02/2016 9:45 AM Medical Record Number: [id number] Patient Account Number: [id number] Date of Birth/Sex: 1945/09/18 (71 y.o. Male) Treating RN: Ahmed Prima Primary Care Carlous Olivares: [name] Other Clinician: Referring Peg Fifer: [name] Treating Hasna Stefanik/Extender: Frann Rider in Treatment: 5 Wound Status Wound Number: 2 Primary Lymphedema Etiology: Wound Location: Left Lower Leg - Circumfernential Wound Open Status: Wounding Event: Gradually Appeared Comorbid Anemia, Lymphedema, Chronic Date Acquired: 07/10/2016 History: Obstructive Pulmonary Disease (COPD), Weeks Of Treatment: 5 Sleep Apnea, Congestive Heart Failure, Clustered Wound: No Coronary Artery Disease, Hypertension, Peripheral Venous Disease, Type II Diabetes, Osteoarthritis, Neuropathy, Received Chemotherapy Photos Photo Uploaded By: Alric Quan on 11/02/2016 15:04:22 Wound  Measurements Length: (cm) 8.5 Width: (cm) 56 Depth: (cm) 0.1 Area: (cm) 373.85 Volume: (cm) 37.385 % Reduction in Area: -16.7% % Reduction in Volume: -16.7% Epithelialization: Small (1-33%) Tunneling: No Undermining: No Wound Description Classification: Partial Thickness Foul Odor After Diabetic Severity Earleen Newport): Grade 1 Due to Product Wound Margin: Flat and Intact  Slough/Fibrino Exudate Amount: Large Exudate Type: Serous Exudate Color: amber RAYSHAWN, MANEY ([id number]) Cleansing: Yes Use: No Yes Wound Bed Granulation Amount: Medium (34-66%) Exposed Structure Granulation Quality: Pink Fascia Exposed: No Necrotic Amount: Medium (34-66%) Fat Layer (Subcutaneous Tissue) Exposed: No Necrotic Quality: Adherent Slough Tendon Exposed: No Muscle Exposed: No Joint Exposed: No Bone Exposed: No Limited to Skin Breakdown Periwound Skin Texture Texture Color No Abnormalities Noted: No No Abnormalities Noted: No Callus: No Atrophie Blanche: No Crepitus: No Cyanosis: No Excoriation: No Ecchymosis: No Induration: No Erythema: No Rash: No Hemosiderin Staining: No Scarring: No Mottled: No Pallor: No Moisture Rubor: No No Abnormalities Noted: No Dry / Scaly: No Temperature / Pain Maceration: Yes Temperature: No Abnormality Tenderness on Palpation: Yes Wound Preparation Ulcer Cleansing: Rinsed/Irrigated with Saline Topical Anesthetic Applied: Other: lidocaine 4%, Treatment Notes Wound #2 (Left, Circumferential Lower Leg) 1. Cleansed with: Clean wound with Normal Saline Cleanse wound with antibacterial soap and water 2. Anesthetic Topical Lidocaine 4% cream to wound bed prior to debridement 4. Dressing Applied: Aquacel Ag 5. Secondary Dressing Applied ABD Pad Dry Gauze 7. Secured with Tape Notes kerlix. Myrle Sheng xtrasorb [place], [place] ([id number]) Electronic Signature(s) Signed: 11/02/2016 4:16:11 PM By: Alric Quan Entered By: Alric Quan on  11/02/2016 09:54:56 Adam Merritt ([id number]) -------------------------------------------------------------------------------- Wound Assessment Details Patient Name: Adam Merritt Date of Service: 11/02/2016 9:45 AM Medical Record Number: [id number] Patient Account Number: [id number] Date of Birth/Sex: December 10, 1945 (71 y.o. Male) Treating RN: Ahmed Prima Primary Care Aime Carreras: [name] Other Clinician: Referring Rielyn Krupinski: [name] Treating Bradi Arbuthnot/Extender: Frann Rider in Treatment: 5 Wound Status Wound Number: 3 Primary Lymphedema Etiology: Wound Location: Right Lower Leg Wound Open Wounding Event: Gradually Appeared Status: Date Acquired: 07/10/2016 Comorbid Anemia, Lymphedema, Chronic Weeks Of Treatment: 5 History: Obstructive Pulmonary Disease (COPD), Clustered Wound: No Sleep Apnea, Congestive Heart Failure, Coronary Artery Disease, Hypertension, Peripheral Venous Disease, Type II Diabetes, Osteoarthritis, Neuropathy, Received Chemotherapy Photos Photo Uploaded By: Alric Quan on 11/02/2016 15:05:09 Wound Measurements Length: (cm) 0.1 Width: (cm) 0.1 Depth: (cm) 0.1 Area: (cm) 0.008 Volume: (cm) 0.001 % Reduction in Area: 99.8% % Reduction in Volume: 99.8% Epithelialization: None Tunneling: No Undermining: No Wound Description Classification: Partial Thickness Foul Odor After Diabetic Severity Earleen Newport): Grade 1 Slough/Fibrino Wound Margin: Flat and Intact Exudate Amount: Large Exudate Type: Serous Exudate Color: amber TRISTON, SKARE ([id number]) Cleansing: No No Wound Bed Granulation Amount: Medium (34-66%) Exposed Structure Granulation Quality: Red Fascia Exposed: No Necrotic Amount: Medium (34-66%) Fat Layer (Subcutaneous Tissue) Exposed: No Tendon Exposed: No Muscle Exposed: No Joint Exposed: No Bone Exposed: No Limited to Skin Breakdown Periwound Skin Texture Texture Color No Abnormalities Noted: No No Abnormalities  Noted: No Callus: No Atrophie Blanche: No Crepitus: No Cyanosis: No Excoriation: No Ecchymosis: No Induration: No Erythema: No Rash: No Hemosiderin Staining: No Scarring: No Mottled: No Pallor: No Moisture Rubor: No No Abnormalities Noted: No Dry / Scaly: No Temperature / Pain Maceration: No Temperature: No Abnormality Tenderness on Palpation: Yes Wound Preparation Ulcer Cleansing: Rinsed/Irrigated with Saline Topical Anesthetic Applied: Other: lidocaine 4%, Treatment Notes Wound #3 (Right Lower Leg) 1. Cleansed with: Clean wound with Normal Saline Cleanse wound with antibacterial soap and water 2. Anesthetic Topical Lidocaine 4% cream to wound bed prior to debridement 4. Dressing Applied: Aquacel Ag 5. Secondary Dressing Applied ABD Pad Dry Gauze 7. Secured with Tape Notes kerlix. guled, gahan ([id number]) Electronic Signature(s) Signed: 11/02/2016 4:16:11 PM By: Alric Quan Entered By: Alric Quan on  11/02/2016 09:54:05 EDSEL, SHIVES ([id number]) -------------------------------------------------------------------------------- Vitals Details Patient Name: KELIJAH, TOWRY Date of Service: 11/02/2016 9:45 AM Medical Record Number: [id number] Patient Account Number: [id number] Date of Birth/Sex: September 14, 1945 (71 y.o. Male) Treating RN: Ahmed Prima Primary Care Kaimani Clayson: [name] Other Clinician: Referring Meggie Laseter: [name] Treating Andre Swander/Extender: Frann Rider in Treatment: 5 Vital Signs Time Taken: 09:45 Temperature (F): 98.1 Height (in): 65 Pulse (bpm): 85 Weight (lbs): 336 Respiratory Rate (breaths/min): 18 Body Mass Index (BMI): 55.9 Blood Pressure (mmHg): 173/78 Reference Range: 80 - 120 mg / dl Electronic Signature(s) Signed: 11/02/2016 4:16:11 PM By: Alric Quan Entered By: Alric Quan on 11/02/2016 09:46:45

## 2016-11-04 NOTE — Progress Notes (Signed)
BUDDIE, MARSTON ([id number]) Visit Report for 11/02/2016 Chief Complaint Document Details Patient Name: Adam Merritt, Adam Merritt Date of Service: 11/02/2016 9:45 AM Medical Record Number: [id number] Patient Account Number: [id number] Date of Birth/Sex: 1945-06-17 (71 y.o. Male) Treating RN: Ahmed Prima Primary Care Provider: [name] Other Clinician: Referring Provider: [name] Treating Provider/Extender: Frann Rider in Treatment: 5 Information Obtained from: Patient Chief Complaint the patient comes along with his wife for excessive swelling of both lower extremities left more than right and weeping of fluid for over 2 months now Electronic Signature(s) Signed: 11/02/2016 11:10:20 AM By: Christin Fudge MD, FACS Entered By: Christin Fudge on 11/02/2016 11:10:20 Adam Merritt ([id number]) -------------------------------------------------------------------------------- HPI Details Patient Name: Adam Merritt Date of Service: 11/02/2016 9:45 AM Medical Record Number: [id number] Patient Account Number: [id number] Date of Birth/Sex: 31-Dec-1945 (71 y.o. Male) Treating RN: Ahmed Prima Primary Care Provider: [name] Other Clinician: Referring Provider: [name] Treating Provider/Extender: Frann Rider in Treatment: 5 History of Present Illness Location: bilateral lower leg edema with some blisters recently and weeping of fluid left side more than right Quality: Patient reports experiencing a dull pain to affected area(s). Severity: Patient states wound are getting worse. Duration: Patient has had the wound for > 3 months prior to seeking treatment at the wound center Timing: Pain in wound is Intermittent (comes and goes Context: The wound appeared gradually over time Modifying Factors: Consults to this date include: he has been seen by his PCP and she sent him off to see [place]. Associated Signs and Symptoms: Patient reports having difficulty standing for long  periods. HPI Description: 71 year old gentleman seen earlier at our clinic in June 2016 and again in June 2017 is noncompliant with recommendations and does not follow-up regularly. 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 He had referred him to the lymphedema clinic and he was accepted but he did not follow-up with them. At that stage he had agreed to get a repeat arterial study done and he has seen Dr. Trula Slade in [place]. This time around he has been seen by his PCP Dr. Tiburcio Bash who referred him urgently to [place] for bilateral lower extremity cellulitis in spite of being on antibiotics -- he was placed on Keflex and 500 mg 3 times a day for 10 days. His comorbidities include coronary artery disease, diabetes mellitus, hypertension, Bell's palsy and chronic lymphedema. he was also recently admitted to the hospital on April 13 and kept overnight with the diagnosis of a TIA and a left ICA stenosis to follow-up with vascular surgery as an outpatient 10/05/2016 -- patient has been compliant this week and left his compression and dressing on for the entire week and had home health change it appropriately. ==== Old Notes: 71 year old gentleman who comes with bilateral lower limb edema for over 20 years and recent attack of blisters and redness for about 2 weeks. The patient says he has had bilateral lower limb edema since 1996. He has been noncompliant with wearing wraps in the past and even was given a boot to wear at night which she never tolerated and didn't wear it. He has a past medical history of sleep apnea, neuropathy with diabetes, hypertension, hyperlipidemia, coronary artery disease, varicose veins, carotid artery disease and is status post  CABG and cholecystectomy. He was  seen by the vascular surgeon on 09/29/2013 and Dr. Trula Slade opinion was:"I have reviewed his YOSHIO, SELIGA ([id number]) outside ultrasound which shows an ankle-brachial index of 0.72 on the left and 1.0 on the right.o This study was repeated at our office.o The ankle-brachial index on the right was 1.26 with triphasic waveforms.o On the left it was a 1.04 with biphasic waveforms.o The patient had a toe pressure of 121 on the right and 100 on the left. In summary, I would not recommend any further imaging , as I do not think his symptoms are related to arterial insufficiency, but rather secondary to diabetic neuropathy.o The patient still has room for increasing his Neurontin dose, which he states does help him.o I have scheduled him to come back in one year for a repeat arterial evaluation." in June 2016, a year ago, he has a venous duplex study to be done. He was also going to be accepted to the lymphedema clinic but I did not believe the patient went regularly for this. ==== Electronic Signature(s) Signed: 11/02/2016 11:10:50 AM By: Christin Fudge MD, FACS Entered By: Christin Fudge on 11/02/2016 11:10:50 Adam Merritt ([id number]) -------------------------------------------------------------------------------- Physical Exam Details Patient Name: Adam Merritt Date of Service: 11/02/2016 9:45 AM Medical Record Number: [id number] Patient Account Number: [id number] Date of Birth/Sex: Mar 26, 1946 (71 y.o. Male) Treating RN: Ahmed Prima Primary Care Provider: [name] Other Clinician: Referring Provider: [name] Treating Provider/Extender: Frann Rider in Treatment: 5 Constitutional . Pulse regular. Respirations normal and unlabored. Afebrile. . Eyes Nonicteric. Reactive to light. Ears, Nose, Mouth, and Throat Lips, teeth, and gums WNL.Marland Kitchen Moist mucosa without lesions. Neck supple and nontender. No palpable supraclavicular or cervical adenopathy. Normal sized without  goiter. Respiratory WNL. No retractions.. Breath sounds WNL, No rubs, rales, rhonchi, or wheeze.. Cardiovascular Heart rhythm and rate regular, no murmur or gallop.. Pedal Pulses WNL. No clubbing, cyanosis or edema. Lymphatic No adneopathy. No adenopathy. No adenopathy. Musculoskeletal Adexa without tenderness or enlargement.. Digits and nails w/o clubbing, cyanosis, infection, petechiae, ischemia, or inflammatory conditions.. Integumentary (Hair, Skin) No suspicious lesions. No crepitus or fluctuance. No peri-wound warmth or erythema. No masses.Marland Kitchen Psychiatric Judgement and insight Intact.. No evidence of depression, anxiety, or agitation.. Notes the right leg has started oozing a bit from the medial calf area. The left leg continues to have a lot of open ulceration which is superficial. No sharp debridement was required today. Electronic Signature(s) Signed: 11/02/2016 11:11:28 AM By: Christin Fudge MD, FACS Entered By: Christin Fudge on 11/02/2016 11:11:27 Adam Merritt ([id number]) -------------------------------------------------------------------------------- Physician Orders Details Patient Name: Adam Merritt Date of Service: 11/02/2016 9:45 AM Medical Record Number: [id number] Patient Account Number: [id number] Date of Birth/Sex: Jun 20, 1945 (71 y.o. Male) Treating RN: Ahmed Prima Primary Care Provider: [name] Other Clinician: Referring Provider: [name] Treating Provider/Extender: Frann Rider in Treatment: 5 Verbal / Phone Orders: Yes ClinicianCarolyne Fiscal, Debi Read Back and Verified: Yes Diagnosis Coding Wound Cleansing Wound #2 Left,Circumferential Lower Leg o Clean wound with Normal Saline. o Cleanse wound with mild soap and water Wound #3 Right Lower Leg o Clean wound with Normal Saline. o Cleanse wound with mild soap and water Primary Wound Dressing Wound #2 Left,Circumferential Lower Leg o Aquacel Ag Wound #3 Right Lower Leg o  Aquacel Ag Secondary Dressing Wound #2 Left,Circumferential Lower Leg o ABD pad o XtraSorb - HHRN PLEASE PROVIDE XTRASORB FOR PATIENT Wound #3 Right Lower Leg o ABD pad o XtraSorb -  HHRN PLEASE PROVIDE XTRASORB FOR PATIENT Dressing Change Frequency Wound #2 Left,Circumferential Lower Leg o Three times weekly Wound #3 Right Lower Leg o Three times weekly Follow-up Appointments Wound #2 Left,Circumferential Lower Leg o Return Appointment in 1 week. KODY, BRANDL ([id number]) Wound #3 Right Lower Leg o Return Appointment in 1 week. Edema Control Wound #2 Left,Circumferential Lower Leg o Kerlix and Coban - Bilateral - may anchor with unna paste Wound #3 Right Lower Leg o Patient to wear own Juxtalite/Juzo compression garment. - HHRN to teach patient and spouse how to apply juxtalite once available Additional Orders / Instructions Wound #2 Left,Circumferential Lower Leg o Increase protein intake. o Other: - please work to keep blood sugars below 180 Wound #3 Right Lower Leg o Increase protein intake. o Other: - please work to keep blood sugars below [place] #2 Left,Circumferential Lower Leg o [place] Visits - [place] Nurse may visit PRN to address patientos wound care needs. o FACE TO FACE ENCOUNTER: MEDICARE and MEDICAID PATIENTS: I certify that this patient is under my care and that I had a face-to-face encounter that meets the physician face-to-face encounter requirements with this patient on this date. The encounter with the patient was in whole or in part for the following MEDICAL CONDITION: (primary reason for [place]) MEDICAL NECESSITY: I certify, that based on my findings, NURSING services are a medically necessary home health service. HOME BOUND STATUS: I certify that my clinical findings support that this patient is homebound (i.e., Due to illness or injury, pt requires aid  of supportive devices such as crutches, cane, wheelchairs, walkers, the use of special transportation or the assistance of another person to leave their place of residence. There is a normal inability to leave the home and doing so requires considerable and taxing effort. Other absences are for medical reasons / religious services and are infrequent or of short duration when for other reasons). o If current dressing causes regression in wound condition, may D/C ordered dressing product/s and apply Normal Saline Moist Dressing daily until next [place] / Other MD appointment. [place] of regression in wound condition at [phone]. o Please direct any NON-WOUND related issues/requests for orders to patient's Primary Care Physician Wound #3 Right Lower Leg o [place] Visits - [place] Nurse may visit PRN to address patientos wound care needs. o FACE TO FACE ENCOUNTER: MEDICARE and MEDICAID PATIENTS: I certify that this patient is under my care and that I had a face-to-face encounter that meets the physician face-to-face JERMON, CHALFANT ([id number]) encounter requirements with this patient on this date. The encounter with the patient was in whole or in part for the following MEDICAL CONDITION: (primary reason for [place]) MEDICAL NECESSITY: I certify, that based on my findings, NURSING services are a medically necessary home health service. HOME BOUND STATUS: I certify that my clinical findings support that this patient is homebound (i.e., Due to illness or injury, pt requires aid of supportive devices such as crutches, cane, wheelchairs, walkers, the use of special transportation or the assistance of another person to leave their place of residence. There is a normal inability to leave the home and doing so requires considerable and taxing effort. Other absences are for medical reasons / religious services and are  infrequent or of short duration when for other reasons). o If current dressing causes regression in wound condition, may D/C ordered dressing product/s and  apply Normal Saline Moist Dressing daily until next [place] / Other MD appointment. [place] of regression in wound condition at [phone]. o Please direct any NON-WOUND related issues/requests for orders to patient's Primary Care Physician Electronic Signature(s) Signed: 11/02/2016 4:16:11 PM By: Alric Quan Signed: 11/02/2016 4:34:04 PM By: Christin Fudge MD, FACS Entered By: Alric Quan on 11/02/2016 10:02:57 Adam Merritt ([id number]) -------------------------------------------------------------------------------- Problem List Details Patient Name: Adam Merritt Date of Service: 11/02/2016 9:45 AM Medical Record Number: [id number] Patient Account Number: [id number] Date of Birth/Sex: 09/12/1945 (71 y.o. 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 Male) Treating RN: Ahmed Prima Primary Care Provider: [name] Other Clinician: Referring Provider: [name] Treating Provider/Extender: Frann Rider in Treatment: 5 Subjective Chief Complaint Information obtained from Patient the patient comes along with his wife for excessive swelling of both lower extremities left more than right and weeping of fluid for over 2 months now History of Present Illness (HPI) The following HPI elements were documented for the patient's wound: Location: bilateral lower leg edema with some blisters recently and weeping of fluid left side more than right Quality: Patient reports experiencing a dull pain to affected area(s). Severity: Patient states wound are getting worse. Duration: Patient has had the wound for > 3 months prior to seeking treatment at the wound center Timing: Pain in wound is Intermittent (comes and goes Context: The wound appeared gradually over time Modifying Factors: Consults to this date include: he has been seen by his PCP and she sent him off to see [place]. Associated Signs and Symptoms: Patient reports having difficulty standing for long periods. 71 year old gentleman seen earlier at our clinic in June 2016 and again in June 2017 is noncompliant with recommendations and does not follow-up regularly. 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 He had referred him to the lymphedema clinic and he was accepted but he did not follow-up with them. At that stage he had agreed to get a repeat arterial study done and he has seen Dr. Trula Slade in [place]. This time around he has been seen by his PCP Dr.  Tiburcio Bash who referred him urgently to [place] for bilateral lower extremity cellulitis in spite of being on antibiotics -- he was placed on Keflex and 500 mg 3 times a day for 10 days. His comorbidities include coronary artery disease, diabetes mellitus, hypertension, Bell's palsy and chronic lymphedema. he was also recently admitted to the hospital on April 13  and kept overnight with the diagnosis of a TIA and a left ICA stenosis to follow-up with vascular surgery as an outpatient 10/05/2016 -- patient has been compliant this week and left his compression and dressing on for the entire week and had home health change it appropriately. ==== Old Notes: 71 year old gentleman who comes with bilateral lower limb edema for over 20 years and recent attack of blisters and redness for about 2 weeks. ZAMEER, BORMAN ([id number]) The patient says he has had bilateral lower limb edema since 1996. He has been noncompliant with wearing wraps in the past and even was given a boot to wear at night which she never tolerated and didn't wear it. He has a past medical history of sleep apnea, neuropathy with diabetes, hypertension, hyperlipidemia, coronary artery disease, varicose veins, carotid artery disease and is status post CABG and cholecystectomy. He was seen by the vascular surgeon on 09/29/2013 and Dr. Trula Slade opinion was:"I have reviewed his outside ultrasound which shows an ankle-brachial index of 0.72 on the left and 1.0 on the right. This study was repeated at our office. The ankle-brachial index on the right was 1.26 with triphasic waveforms. On the left it was a 1.04 with biphasic waveforms. The patient had a toe pressure of 121 on the right and 100 on the left. In summary, I would not recommend any further imaging , as I do not think his symptoms are related to arterial insufficiency, but rather secondary to diabetic neuropathy. The patient still has room for increasing his Neurontin dose, which he  states does help him. I have scheduled him to come back in one year for a repeat arterial evaluation." in June 2016, a year ago, he has a venous duplex study to be done. He was also going to be accepted to the lymphedema clinic but I did not believe the patient went regularly for this. ==== Objective Constitutional Pulse regular. Respirations normal and unlabored. Afebrile. Vitals Time Taken: 9:45 AM, Height: 65 in, Weight: 336 lbs, BMI: 55.9, Temperature: 98.1 F, Pulse: 85 bpm, Respiratory Rate: 18 breaths/min, Blood Pressure: 173/78 mmHg. Eyes Nonicteric. Reactive to light. Ears, Nose, Mouth, and Throat Lips, teeth, and gums WNL.Marland Kitchen Moist mucosa without lesions. Neck supple and nontender. No palpable supraclavicular or cervical adenopathy. Normal sized without goiter. Respiratory WNL. No retractions.. Breath sounds WNL, No rubs, rales, rhonchi, or wheeze.. Cardiovascular Heart rhythm and rate regular, no murmur or gallop.. Pedal Pulses WNL. No clubbing, cyanosis or edema. Lymphatic No adneopathy. No adenopathy. No adenopathy. AVEON, COLQU[place]N ([id number]) Musculoskeletal Adexa without tenderness or enlargement.. Digits and nails w/o clubbing, cyanosis, infection, petechiae, ischemia, or inflammatory conditions.Marland Kitchen Psychiatric Judgement and insight Intact.. No evidence of depression, anxiety, or agitation.. General Notes: the right leg has started oozing a bit from the medial calf area. The left leg continues to have a lot of open ulceration which is superficial. No sharp debridement was required today. Integumentary (Hair, Skin) No suspicious lesions. No crepitus or fluctuance. No peri-wound warmth or erythema. No masses.. Wound #2 status is Open. Original cause of wound was Gradually Appeared. The wound is located on the Left,Circumferential Lower Leg. The wound measures 8.5cm length x 56cm width x 0.1cm depth; 373.85cm^2 area and 37.385cm^3 volume. The wound is limited to skin  breakdown. There is no tunneling or undermining noted. There is a large amount of serous drainage noted. The wound margin is flat and intact. There is medium (34-66%) pink granulation within the wound bed. There is a medium (34-66%)  amount of necrotic tissue within the wound bed including Adherent Slough. The periwound skin appearance exhibited: Maceration. The periwound skin appearance did not exhibit: Callus, Crepitus, Excoriation, Induration, Rash, Scarring, Dry/Scaly, Atrophie Blanche, Cyanosis, Ecchymosis, Hemosiderin Staining, Mottled, Pallor, Rubor, Erythema. Periwound temperature was noted as No Abnormality. The periwound has tenderness on palpation. Wound #3 status is Open. Original cause of wound was Gradually Appeared. The wound is located on the Right Lower Leg. The wound measures 0.1cm length x 0.1cm width x 0.1cm depth; 0.008cm^2 area and 0.001cm^3 volume. The wound is limited to skin breakdown. There is no tunneling or undermining noted. There is a large amount of serous drainage noted. The wound margin is flat and intact. There is medium (34-66%) red granulation within the wound bed. There is a medium (34-66%) amount of necrotic tissue within the wound bed. The periwound skin appearance did not exhibit: Callus, Crepitus, Excoriation, Induration, Rash, Scarring, Dry/Scaly, Maceration, Atrophie Blanche, Cyanosis, Ecchymosis, Hemosiderin Staining, Mottled, Pallor, Rubor, Erythema. Periwound temperature was noted as No Abnormality. The periwound has tenderness on palpation. 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 Cleanse wound with mild soap and water Wound #3 Right Lower Leg: Clean wound with Normal Saline. Cleanse wound with mild soap and water Primary Wound Dressing: Wound #2 Left,Circumferential Lower Leg: Aquacel Ag Wound #3 Right Lower Leg: Aquacel Ag Secondary Dressing: Wound #2 Left,Circumferential Lower Leg: ABD pad XtraSorb - HHRN PLEASE PROVIDE XTRASORB FOR PATIENT Wound #3 Right Lower Leg: ABD pad XtraSorb - HHRN PLEASE PROVIDE XTRASORB FOR PATIENT Dressing Change Frequency: Wound #2 Left,Circumferential Lower Leg: Three times weekly Wound #3 Right Lower Leg: Three times weekly Follow-up Appointments: Wound #2 Left,Circumferential Lower Leg: Return Appointment in 1 week. Wound #3 Right Lower Leg: Return Appointment in 1 week. Edema Control: Wound #2 Left,Circumferential Lower Leg: Kerlix and Coban - Bilateral - may anchor with unna paste Wound #3 Right Lower Leg: Patient to wear own Juxtalite/Juzo compression garment. - HHRN to teach patient and spouse how to apply juxtalite once available Additional Orders / Instructions: Wound #2 Left,Circumferential Lower Leg: Increase protein intake. Other: - please work to keep blood sugars below 180 Wound #3 Right Lower Leg: Increase protein intake. Other: - please work to keep blood sugars below [place] ([id number]) Home Health: Wound #2 Left,Circumferential Lower Leg: Continue Home Health Visits - [place] Nurse may visit PRN to address patient s wound care needs. FACE TO FACE ENCOUNTER: MEDICARE and MEDICAID PATIENTS: I certify that this patient is under my care and that I had a face-to-face encounter that meets the physician face-to-face encounter requirements with this patient on this date. The encounter with the patient was in whole or in part for the following MEDICAL CONDITION: (primary reason for [place]) MEDICAL  NECESSITY: I certify, that based on my findings, NURSING services are a medically necessary home health service. HOME BOUND STATUS: I certify that my clinical findings support that this patient is homebound (i.e., Due to illness or injury, pt requires aid of supportive devices such as crutches, cane, wheelchairs, walkers, the use of special transportation or the assistance of  another person to leave their place of residence. There is a normal inability to leave the home and doing so requires considerable and taxing effort. Other absences are for medical reasons / religious services and are infrequent or of short duration when for other reasons). If current dressing causes regression in wound condition, may D/C ordered dressing product/s and apply Normal Saline Moist Dressing daily until next [place] / Other MD appointment. [place] of regression in wound condition at [phone]. Please direct any NON-WOUND related issues/requests for orders to patient's Primary Care Physician Wound #3 Right Lower Leg: [place] Visits - [place] Nurse may visit PRN to address patient s wound care needs. FACE TO FACE ENCOUNTER: MEDICARE and MEDICAID PATIENTS: I certify that this patient is under my care and that I had a face-to-face encounter that meets the physician face-to-face encounter requirements with this patient on this date. The encounter with the patient was in whole or in part for the following MEDICAL CONDITION: (primary reason for [place]) MEDICAL NECESSITY: I certify, that based on my findings, NURSING services are a medically necessary home health service. HOME BOUND STATUS: I certify that my clinical findings support that this patient is homebound (i.e., Due to illness or injury, pt requires aid of supportive devices such as crutches, cane, wheelchairs, walkers, the use of special transportation or the assistance of another person to  leave their place of residence. There is a normal inability to leave the home and doing so requires considerable and taxing effort. Other absences are for medical reasons / religious services and are infrequent or of short duration when for other reasons). If current dressing causes regression in wound condition, may D/C ordered dressing product/s and apply Normal Saline Moist Dressing daily until next [place] / Other MD appointment. Andrew of regression in wound condition at [phone]. Please direct any NON-WOUND related issues/requests for orders to patient's Primary Care Physician His right leg has minor superficial ulcers and we will continue with compression to both lower extremity. I have recommended: 1. Silver alginate and a light Kerlix and Coban dressing to both lower extremities. 2. order juxta lites for the right lower extremity 3. Good control of his diabetes mellitus 4. Elevation and exercise. NASHAUN, HILLMER ([id number]) Electronic Signature(s) Signed: 11/02/2016 11:12:47 AM By: Christin Fudge MD, FACS Entered By: Christin Fudge on 11/02/2016 11:12:47 Adam Merritt ([id number]) -------------------------------------------------------------------------------- SuperBill Details Patient Name: Adam Merritt Date of Service: 11/02/2016 Medical Record Number: [id number] Patient Account Number: [id number] Date of Birth/Sex: [birth year] (71 y.o. 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 Facility Procedures CPT4 Code: [id number] Description: 03559 - WOUND CARE VISIT-LEV 5 EST PT Modifier: Quantity: 1 Physician Procedures CPT4 Code Description: 7416384 99213 - WC PHYS LEVEL 3 - EST PT ICD-10 Description Diagnosis 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

## 2016-11-06 ENCOUNTER — Encounter: Payer: Medicare Other | Attending: Surgery | Admitting: Surgery

## 2016-11-06 DIAGNOSIS — Z9221 Personal history of antineoplastic chemotherapy: Secondary | ICD-10-CM | POA: Insufficient documentation

## 2016-11-06 DIAGNOSIS — Z6841 Body Mass Index (BMI) 40.0 and over, adult: Secondary | ICD-10-CM | POA: Diagnosis not present

## 2016-11-06 DIAGNOSIS — I2510 Atherosclerotic heart disease of native coronary artery without angina pectoris: Secondary | ICD-10-CM | POA: Insufficient documentation

## 2016-11-06 DIAGNOSIS — Z9119 Patient's noncompliance with other medical treatment and regimen: Secondary | ICD-10-CM | POA: Insufficient documentation

## 2016-11-06 DIAGNOSIS — J449 Chronic obstructive pulmonary disease, unspecified: Secondary | ICD-10-CM | POA: Insufficient documentation

## 2016-11-06 DIAGNOSIS — Z8673 Personal history of transient ischemic attack (TIA), and cerebral infarction without residual deficits: Secondary | ICD-10-CM | POA: Insufficient documentation

## 2016-11-06 DIAGNOSIS — I890 Lymphedema, not elsewhere classified: Secondary | ICD-10-CM | POA: Diagnosis not present

## 2016-11-06 DIAGNOSIS — L97311 Non-pressure chronic ulcer of right ankle limited to breakdown of skin: Secondary | ICD-10-CM | POA: Diagnosis not present

## 2016-11-06 DIAGNOSIS — E1140 Type 2 diabetes mellitus with diabetic neuropathy, unspecified: Secondary | ICD-10-CM | POA: Diagnosis not present

## 2016-11-06 DIAGNOSIS — L97221 Non-pressure chronic ulcer of left calf limited to breakdown of skin: Secondary | ICD-10-CM | POA: Insufficient documentation

## 2016-11-06 DIAGNOSIS — E1151 Type 2 diabetes mellitus with diabetic peripheral angiopathy without gangrene: Secondary | ICD-10-CM | POA: Insufficient documentation

## 2016-11-06 DIAGNOSIS — G4730 Sleep apnea, unspecified: Secondary | ICD-10-CM | POA: Diagnosis not present

## 2016-11-06 DIAGNOSIS — E11620 Type 2 diabetes mellitus with diabetic dermatitis: Secondary | ICD-10-CM | POA: Diagnosis not present

## 2016-11-06 DIAGNOSIS — I10 Essential (primary) hypertension: Secondary | ICD-10-CM | POA: Diagnosis not present

## 2016-11-06 NOTE — Progress Notes (Signed)
HOSTEEN, KIENAST ([id number]) Visit Report for 11/06/2016 Arrival Information Details Patient Name: Adam Merritt, Adam Merritt Date of Service: 11/06/2016 1:30 PM Medical Record Number: [id number] Patient Account Number: [id number] Date of Birth/Sex: [birth year] (71 y.o. Male) Treating RN: Montey Hora Primary Care Sevin Farone: [name] Other Clinician: Referring Gift Rueckert: [name] Treating Zaylei Mullane/Extender: Frann Rider in Treatment: 5 Visit Information History Since Last Visit Added or deleted any medications: No Patient Arrived: [name] Any new allergies or adverse reactions: No Arrival Time: 13:24 Had a fall or experienced change in No Accompanied By: spouse activities of daily living that may affect Transfer Assistance: None risk of falls: Patient Identification Verified: Yes Signs or symptoms of abuse/neglect since last No Secondary Verification Process Yes visito Completed: Hospitalized since last visit: No Patient Requires Transmission- No Has Dressing in Place as Prescribed: Yes Based Precautions: Has Compression in Place as Prescribed: Yes Patient Has Alerts: Yes Pain Present Now: No Patient Alerts: DMII ABI [place] BILATERAL >220 Electronic Signature(s) Signed: 11/06/2016 3:57:57 PM By: Montey Hora Entered By: Montey Hora on 11/06/2016 13:25:53 Adam Merritt ([id number]) -------------------------------------------------------------------------------- Clinic Level of Care Assessment Details Patient Name: Adam Merritt Date of Service: 11/06/2016 1:30 PM Medical Record Number: [id number] Patient Account Number: [id number] Date of Birth/Sex: 05/27/1946 (71 y.o. Male) Treating RN: Montey Hora Primary Care Cain Fitzhenry: [name] Other Clinician: Referring Aylssa Herrig: [name] Treating Jaselyn Nahm/Extender: Frann Rider in Treatment: 5 Clinic Level of Care Assessment Items TOOL 4 Quantity Score []  - Use when only an EandM is performed on FOLLOW-UP visit  0 ASSESSMENTS - Nursing Assessment / Reassessment X - Reassessment of Co-morbidities (includes updates in patient status) 1 10 X - Reassessment of Adherence to Treatment Plan 1 5 ASSESSMENTS - Wound and Skin Assessment / Reassessment []  - Simple Wound Assessment / Reassessment - one wound 0 X - Complex Wound Assessment / Reassessment - multiple wounds 2 5 []  - Dermatologic / Skin Assessment (not related to wound area) 0 ASSESSMENTS - Focused Assessment X - Circumferential Edema Measurements - multi extremities 2 5 []  - Nutritional Assessment / Counseling / Intervention 0 X - Lower Extremity Assessment (monofilament, tuning fork, pulses) 1 5 []  - Peripheral Arterial Disease Assessment (using hand held doppler) 0 ASSESSMENTS - Ostomy and/or Continence Assessment and Care []  - Incontinence Assessment and Management 0 []  - Ostomy Care Assessment and Management (repouching, etc.) 0 PROCESS - Coordination of Care X - Simple Patient / Family Education for ongoing care 1 15 []  - Complex (extensive) Patient / Family Education for ongoing care 0 []  - Staff obtains Programmer, systems, Records, Test Results / Process Orders 0 []  - Staff telephones HHA, Nursing Homes / Clarify orders / etc 0 []  - Routine Transfer to another Facility (non-emergent condition) 0 DONALDSON, RICHTER ([id number]) []  - Routine Hospital Admission (non-emergent condition) 0 []  - New Admissions / Biomedical engineer / Ordering NPWT, Apligraf, etc. 0 []  - Emergency Hospital Admission (emergent condition) 0 X - Simple Discharge Coordination 1 10 []  - Complex (extensive) Discharge Coordination 0 PROCESS - Special Needs []  - Pediatric / Minor Patient Management 0 []  - Isolation Patient Management 0 []  - Hearing / Language / Visual special needs 0 []  - Assessment of Community assistance (transportation, D/C planning, etc.) 0 []  - Additional assistance / Altered mentation 0 []  - Support Surface(s) Assessment (bed, cushion, seat, etc.)  0 INTERVENTIONS - Wound Cleansing / Measurement []  - Simple Wound Cleansing - one wound 0 X - Complex Wound Cleansing - multiple wounds  2 5 X - Wound Imaging (photographs - any number of wounds) 1 5 []  - Wound Tracing (instead of photographs) 0 []  - Simple Wound Measurement - one wound 0 X - Complex Wound Measurement - multiple wounds 2 5 INTERVENTIONS - Wound Dressings []  - Small Wound Dressing one or multiple wounds 0 []  - Medium Wound Dressing one or multiple wounds 0 X - Large Wound Dressing one or multiple wounds 2 20 []  - Application of Medications - topical 0 []  - Application of Medications - injection 0 INTERVENTIONS - Miscellaneous []  - External ear exam 0 ORVIL, FARAONE ([id number]) []  - Specimen Collection (cultures, biopsies, blood, body fluids, etc.) 0 []  - Specimen(s) / Culture(s) sent or taken to Lab for analysis 0 []  - Patient Transfer (multiple staff / Civil Service fast streamer / Similar devices) 0 []  - Simple Staple / Suture removal (25 or less) 0 []  - Complex Staple / Suture removal (26 or more) 0 []  - Hypo / Hyperglycemic Management (close monitor of Blood Glucose) 0 []  - Ankle / Brachial Index (ABI) - do not check if billed separately 0 X - Vital Signs 1 5 Has the patient been seen at the hospital within the last three years: Yes Total Score: 135 Level Of Care: New/Established - Level 4 Electronic Signature(s) Signed: 11/06/2016 3:57:57 PM By: Montey Hora Entered By: Montey Hora on 11/06/2016 14:19:59 Adam Merritt ([id number]) -------------------------------------------------------------------------------- Encounter Discharge Information Details Patient Name: Adam Merritt Date of Service: 11/06/2016 1:30 PM Medical Record Number: [id number] Patient Account Number: [id number] Date of Birth/Sex: 09-26-1945 (71 y.o. Male) Treating RN: Montey Hora Primary Care Ryman Rathgeber: [name] Other Clinician: Referring Cardelia Sassano: [name] Treating Kriss Ishler/Extender: Frann Rider in Treatment: 5 Encounter Discharge Information Items Discharge Pain Level: 0 Discharge Condition: Stable Ambulatory Status: Cane Discharge Destination: Home Transportation: Private Auto Accompanied By: spouse Schedule Follow-up Appointment: Yes Medication Reconciliation completed No and provided to Patient/Care Lailoni Baquera: Provided on Clinical Summary of Care: 11/13/2016 Form Type Recipient Paper Patient JT Electronic Signature(s) Signed: 11/06/2016 2:15:01 PM By: Sharon Mt Entered By: Sharon Mt on 11/06/2016 14:15:00 Adam Merritt ([id number]) -------------------------------------------------------------------------------- Lower Extremity Assessment Details Patient Name: Adam Merritt Date of Service: 11/06/2016 1:30 PM Medical Record Number: [id number] Patient Account Number: [id number] Date of Birth/Sex: [birth year] (71 y.o. Male) Treating RN: Montey Hora Primary Care Exilda Wilhite: [name] Other Clinician: Referring Cullen Lahaie: [name] Treating Shanterria Franta/Extender: Frann Rider in Treatment: 5 Edema Assessment Assessed: [Left: No] [Right: No] E[Left: dema] [Right: :] Calf Left: Right: Point of Measurement: 34 cm From Medial Instep 63.5 cm 55.3 cm Ankle Left: Right: Point of Measurement: 12 cm From Medial Instep 40.5 cm 32.5 cm Vascular Assessment Pulses: Dorsalis Pedis Palpable: [Left:Yes] [Right:Yes] Posterior Tibial Extremity colors, hair growth, and conditions: Extremity Color: [Left:Hyperpigmented] [Right:Hyperpigmented] Hair Growth on Extremity: [Left:No] [Right:No] Temperature of Extremity: [Left:Warm] [Right:Warm] Capillary Refill: [Left:< 3 seconds] [Right:< 3 seconds] Toe Nail Assessment Left: Right: Thick: Yes Yes Discolored: Yes Yes Deformed: Yes Yes Improper Length and Hygiene: No No Electronic Signature(s) Signed: 11/06/2016 3:57:57 PM By: Montey Hora Entered By: Montey Hora on 11/06/2016 13:43:26 Adam Merritt  ([id number]) -------------------------------------------------------------------------------- Multi Wound Chart Details Patient Name: Adam Merritt Date of Service: 11/06/2016 1:30 PM Medical Record Number: [id number] Patient Account Number: [id number] Date of Birth/Sex: 1945/11/21 (71 y.o. Male) Treating RN: Montey Hora Primary Care Loella Hickle: [name] Other Clinician: Referring Destanee Bedonie: [name] Treating Shain Pauwels/Extender: Frann Rider in Treatment: 5 Vital Signs Height(in): 65 Pulse(bpm): 85  Weight(lbs): 336 Blood Pressure 143/65 (mmHg): Body Mass Index(BMI): 56 Temperature(F): 98.3 Respiratory Rate 18 (breaths/min): Photos: [2:No Photos] [3:No Photos] [N/A:N/A] Wound Location: [2:Left Lower Leg - Circumfernential] [3:Right Lower Leg] [N/A:N/A] Wounding Event: [2:Gradually Appeared] [3:Gradually Appeared] [N/A:N/A] Primary Etiology: [2:Lymphedema] [3:Lymphedema] [N/A:N/A] Comorbid History: [2:Anemia, Lymphedema, Chronic Obstructive Pulmonary Disease (COPD), Sleep Apnea, Congestive Heart Failure, Coronary Artery Disease, Hypertension, Peripheral Venous Disease, Type II Diabetes, Osteoarthritis, Neuropathy, Received  Chemotherapy] [3:Anemia, Lymphedema, Chronic Obstructive Pulmonary Disease (COPD), Sleep Apnea, Congestive Heart Failure, Coronary Artery Disease, Hypertension, Peripheral Venous Disease, Type II Diabetes, Osteoarthritis, Neuropathy, Received  Chemotherapy] [N/A:N/A] Date Acquired: [2:07/10/2016] [3:07/10/2016] [N/A:N/A] Weeks of Treatment: [2:5] [3:5] [N/A:N/A] Wound Status: [2:Open] [3:Open] [N/A:N/A] Measurements L x W x D 8x34x0.1 [3:2x1x0.1] [N/A:N/A] (cm) Area (cm) : [2:213.628] [3:1.571] [N/A:N/A] Volume (cm) : [2:21.363] [3:0.157] [N/A:N/A] % Reduction in Area: [2:33.30%] [3:66.70%] [N/A:N/A] % Reduction in Volume: 33.30% [3:66.70%] [N/A:N/A] Classification: [2:Partial Thickness] [3:Partial Thickness] [N/A:N/A] HBO Classification: [2:Grade  1] [3:Grade 1] [N/A:N/A] Exudate Amount: [2:Large] [3:Large] [N/A:N/A] Exudate Type: [2:Serous] [3:Serous] [N/A:N/A] Exudate Color: [2:amber] [3:amber] [N/A:N/A] Foul Odor After Yes No N/A Cleansing: Odor Anticipated Due to No N/A N/A Product Use: Wound Margin: Flat and Intact Flat and Intact N/A Granulation Amount: Medium (34-66%) Medium (34-66%) N/A Granulation Quality: Pink Red N/A Necrotic Amount: Medium (34-66%) Medium (34-66%) N/A Exposed Structures: Fascia: No Fascia: No N/A Fat Layer (Subcutaneous Fat Layer (Subcutaneous Tissue) Exposed: No Tissue) Exposed: No Tendon: No Tendon: No Muscle: No Muscle: No Joint: No Joint: No Bone: No Bone: No Limited to Skin Limited to Skin Breakdown Breakdown Epithelialization: Small (1-33%) None N/A Periwound Skin Texture: Excoriation: No Excoriation: No N/A Induration: No Induration: No Callus: No Callus: No Crepitus: No Crepitus: No Rash: No Rash: No Scarring: No Scarring: No Periwound Skin Maceration: Yes Maceration: No N/A Moisture: Dry/Scaly: No Dry/Scaly: No Periwound Skin Color: Atrophie Blanche: No Atrophie Blanche: No N/A Cyanosis: No Cyanosis: No Ecchymosis: No Ecchymosis: No Erythema: No Erythema: No Hemosiderin Staining: No Hemosiderin Staining: No Mottled: No Mottled: No Pallor: No Pallor: No Rubor: No Rubor: No Temperature: No Abnormality No Abnormality N/A Tenderness on Yes Yes N/A Palpation: Wound Preparation: Ulcer Cleansing: Other: Ulcer Cleansing: Other: N/A soap and water soap and water Topical Anesthetic Topical Anesthetic Applied: Other: lidocaine Applied: Other: lidocaine 4% 4% Treatment Notes Electronic Signature(s) Signed: 11/06/2016 2:02:21 PM By: Christin Fudge MD, FACS Entered By: Christin Fudge on 11/06/2016 14:02:21 Adam Merritt ([id number]) ASHYR, HEDGEPATH ([id number]) -------------------------------------------------------------------------------- [place] Details Patient Name: Adam Merritt Date of Service: 11/06/2016 1:30 PM Medical Record Number: [id number] Patient Account Number: [id number] Date of Birth/Sex: August 23, 1945 (71 y.o. Male) Treating RN: Montey Hora Primary Care Chrisann Melaragno: [name] Other Clinician: Referring Chamberlain Steinborn: [name] Treating Gael Delude/Extender: Frann Rider in Treatment: 5 Active Inactive ` Abuse / Safety / Falls / Self Care Management Nursing Diagnoses: Impaired physical mobility Potential for falls Goals: Patient will remain injury free Date Initiated: 09/28/2016 Target Resolution Date: 12/08/2016 Goal Status: Active Interventions: Assess fall risk on admission and as needed Notes: ` Orientation to the Wound Care Program Nursing Diagnoses: Knowledge deficit related to the wound healing center program Goals: Patient/caregiver will verbalize understanding of the [place] Date Initiated: 09/28/2016 Target Resolution Date: 12/08/2016 Goal Status: Active Interventions: Provide education on orientation to the wound center Notes: ` Wound/Skin Impairment Nursing Diagnoses: Impaired tissue integrity DIOR, STEPTER ([id number]) Goals: Patient/caregiver will verbalize understanding of skin care regimen Date Initiated: 09/28/2016 Target Resolution Date: 12/08/2016  Goal Status: Active Ulcer/skin breakdown will have a volume reduction of 30% by week 4 Date Initiated: 09/28/2016 Target Resolution Date: 12/08/2016 Goal Status: Active Ulcer/skin breakdown will have a volume reduction of 50% by week 8 Date Initiated: 09/28/2016 Target Resolution Date: 12/08/2016 Goal Status: Active Ulcer/skin breakdown will have a volume reduction of 80% by week 12 Date Initiated: 09/28/2016 Target Resolution Date: 12/08/2016 Goal Status: Active Ulcer/skin breakdown will heal within 14 weeks Date Initiated: 09/28/2016 Target Resolution Date: 12/09/2016 Goal Status: Active Interventions: Assess  patient/caregiver ability to obtain necessary supplies Assess patient/caregiver ability to perform ulcer/skin care regimen upon admission and as needed Assess ulceration(s) every visit Notes: Electronic Signature(s) Signed: 11/06/2016 3:57:57 PM By: Montey Hora Entered By: Montey Hora on 11/06/2016 13:43:39 Adam Merritt ([id number]) -------------------------------------------------------------------------------- Pain Assessment Details Patient Name: Adam Merritt Date of Service: 11/06/2016 1:30 PM Medical Record Number: [id number] Patient Account Number: [id number] Date of Birth/Sex: [birth year] (71 y.o. Male) Treating RN: Montey Hora Primary Care Takenya Travaglini: [name] Other Clinician: Referring Malyiah Fellows: [name] Treating Lyvia Mondesir/Extender: Frann Rider in Treatment: 5 Active Problems Location of Pain Severity and Description of Pain Patient Has Paino No Site Locations Pain Management and Medication Current Pain Management: Notes Topical or injectable lidocaine is offered to patient for acute pain when surgical debridement is performed. If needed, Patient is instructed to use over the counter pain medication for the following 24-48 hours after debridement. Wound care MDs do not prescribed pain medications. Patient has chronic pain or uncontrolled pain. Patient has been instructed to make an appointment with their Primary Care Physician for pain management. Electronic Signature(s) Signed: 11/06/2016 3:57:57 PM By: Montey Hora Entered By: Montey Hora on 11/06/2016 13:26:00 Adam Merritt ([id number]) -------------------------------------------------------------------------------- Patient/Caregiver Education Details Patient Name: Adam Merritt Date of Service: 11/06/2016 1:30 PM Medical Record Number: [id number] Patient Account Number: [id number] Date of Birth/Gender: Jan 03, 1946 (71 y.o. Male) Treating RN: Montey Hora Primary Care Physician: [name] Other Clinician: Referring Physician: [name] Treating Physician/Extender: Frann Rider in Treatment: 5 Education Assessment Education Provided To: Patient and Caregiver Education Topics Provided Venous: Handouts: Other: leg elevation Methods: Explain/Verbal Responses: State content correctly Electronic Signature(s) Signed: 11/06/2016 3:57:57 PM By: Montey Hora Entered By: Montey Hora on 11/06/2016 13:44:47 Adam Merritt ([id number]) -------------------------------------------------------------------------------- Wound Assessment Details Patient Name: Adam Merritt Date of Service: 11/06/2016 1:30 PM Medical Record Number: [id number] Patient Account Number: [id number] Date of Birth/Sex: 06-Feb-1946 (71 y.o. Male) Treating RN: Montey Hora Primary Care Keondrick Dilks: [name] Other Clinician: Referring Devell Parkerson: [name] Treating Baby Gieger/Extender: Frann Rider in Treatment: 5 Wound Status Wound Number: 2 Primary Lymphedema Etiology: Wound Location: Left Lower Leg - Circumfernential Wound Open Status: Wounding Event: Gradually Appeared Comorbid Anemia, Lymphedema, Chronic Date Acquired: 07/10/2016 History: Obstructive Pulmonary Disease (COPD), Weeks Of Treatment: 5 Sleep Apnea, Congestive Heart Failure, Clustered Wound: No Coronary Artery Disease, Hypertension, Peripheral Venous Disease, Type II Diabetes, Osteoarthritis, Neuropathy, Received Chemotherapy Photos Photo Uploaded By: Montey Hora on 11/06/2016 15:59:34 Wound Measurements Length: (cm) 8 Width: (cm) 34 Depth: (cm) 0.1 Area: (cm) 213.628 Volume: (cm) 21.363 % Reduction in Area: 33.3% % Reduction in Volume: 33.3% Epithelialization: Small (1-33%) Tunneling: No Undermining: No Wound Description Classification: Partial Thickness Foul Odor After Diabetic Severity Earleen Newport): Grade 1 Due to Product Wound Margin: Flat and Intact Slough/Fibrino Exudate Amount:  Large Exudate Type: Serous Exudate Color: amber ROSHUN, KLINGENSMITH. ([id number]) Cleansing: Yes Use: No Yes Wound Bed Granulation Amount: Medium (34-66%) Exposed Structure Granulation Quality: Pink  Fascia Exposed: No Necrotic Amount: Medium (34-66%) Fat Layer (Subcutaneous Tissue) Exposed: No Necrotic Quality: Adherent Slough Tendon Exposed: No Muscle Exposed: No Joint Exposed: No Bone Exposed: No Limited to Skin Breakdown Periwound Skin Texture Texture Color No Abnormalities Noted: No No Abnormalities Noted: No Callus: No Atrophie Blanche: No Crepitus: No Cyanosis: No Excoriation: No Ecchymosis: No Induration: No Erythema: No Rash: No Hemosiderin Staining: No Scarring: No Mottled: No Pallor: No Moisture Rubor: No No Abnormalities Noted: No Dry / Scaly: No Temperature / Pain Maceration: Yes Temperature: No Abnormality Tenderness on Palpation: Yes Wound Preparation Ulcer Cleansing: Other: soap and water, Topical Anesthetic Applied: Other: lidocaine 4%, Treatment Notes Wound #2 (Left, Circumferential Lower Leg) 1. Cleansed with: Clean wound with Normal Saline Cleanse wound with antibacterial soap and water 4. Dressing Applied: Aquacel Ag Other dressing (specify in notes) 5. Secondary Dressing Applied ABD Pad 7. Secured with Tape Other (specify in notes) Notes kerlix. coban, [employer]) JAMILL, WETMORE ([id number]) Signed: 11/06/2016 3:57:57 PM By: Montey Hora Entered By: Montey Hora on 11/06/2016 13:40:39 Adam Merritt ([id number]) -------------------------------------------------------------------------------- Wound Assessment Details Patient Name: Adam Merritt Date of Service: 11/06/2016 1:30 PM Medical Record Number: [id number] Patient Account Number: [id number] Date of Birth/Sex: [birth year] (71 y.o. Male) Treating RN: Montey Hora Primary Care Elisse Pennick: [name] Other Clinician: Referring Kaidyn Javid: [name] Treating Marletta Bousquet/Extender: Frann Rider in Treatment: 5 Wound Status Wound Number: 3 Primary Lymphedema Etiology: Wound Location: Right Lower Leg Wound Open Wounding Event: Gradually Appeared Status: Date Acquired: 07/10/2016 Comorbid Anemia, Lymphedema, Chronic Weeks Of Treatment: 5 History: Obstructive Pulmonary Disease (COPD), Clustered Wound: No Sleep Apnea, Congestive Heart Failure, Coronary Artery Disease, Hypertension, Peripheral Venous Disease, Type II Diabetes, Osteoarthritis, Neuropathy, Received Chemotherapy Photos Photo Uploaded By: Montey Hora on 11/06/2016 15:59:35 Wound Measurements Length: (cm) 2 Width: (cm) 1 Depth: (cm) 0.1 Area: (cm) 1.571 Volume: (cm) 0.157 % Reduction in Area: 66.7% % Reduction in Volume: 66.7% Epithelialization: None Tunneling: No Undermining: No Wound Description Classification: Partial Thickness Foul Odor After Diabetic Severity Earleen Newport): Grade 1 Slough/Fibrino Wound Margin: Flat and Intact Exudate Amount: Large Exudate Type: Serous Exudate Color: amber EILAN, MCINERNY ([id number]) Cleansing: No No Wound Bed Granulation Amount: Medium (34-66%) Exposed Structure Granulation Quality: Red Fascia Exposed: No Necrotic Amount: Medium (34-66%) Fat Layer (Subcutaneous Tissue) Exposed: No Tendon Exposed: No Muscle Exposed: No Joint Exposed: No Bone Exposed: No Limited to Skin Breakdown Periwound Skin Texture Texture Color No Abnormalities Noted: No No Abnormalities Noted: No Callus: No Atrophie Blanche: No Crepitus: No Cyanosis: No Excoriation: No Ecchymosis: No Induration: No Erythema: No Rash: No Hemosiderin Staining: No Scarring: No Mottled: No Pallor: No Moisture Rubor: No No Abnormalities Noted: No Dry / Scaly: No Temperature / Pain Maceration: No Temperature: No Abnormality Tenderness on Palpation: Yes Wound Preparation Ulcer Cleansing: Other: soap and water, Topical Anesthetic  Applied: Other: lidocaine 4%, Treatment Notes Wound #3 (Right Lower Leg) 1. Cleansed with: Clean wound with Normal Saline Cleanse wound with antibacterial soap and water 4. Dressing Applied: Aquacel Ag Other dressing (specify in notes) 5. Secondary Dressing Applied ABD Pad 7. Secured with Tape Other (specify in notes) Notes kerlix. Myrle Sheng, [employer]) GRACYN, SANTILLANES ([id number]) Signed: 11/06/2016 3:57:57 PM By: Montey Hora Entered By: Montey Hora on 11/06/2016 13:41:09 Adam Merritt ([id number]) -------------------------------------------------------------------------------- [place] Details Patient Name: Adam Merritt Date of Service: 11/06/2016 1:30 PM Medical Record Number: [id number] Patient Account Number: [id number] Date of Birth/Sex: 04/25/1946 (71 y.o. Male) Treating RN: Marjory Lies,  Di Kindle Primary Care Jaunice Mirza: [name] Other Clinician: Referring Cruzito Standre: [name] Treating Alvah Lagrow/Extender: Frann Rider in Treatment: 5 Vital Signs Time Taken: 13:27 Temperature (F): 98.3 Height (in): 65 Pulse (bpm): 85 Weight (lbs): 336 Respiratory Rate (breaths/min): 18 Body Mass Index (BMI): 55.9 Blood Pressure (mmHg): 143/65 Reference Range: 80 - 120 mg / dl Electronic Signature(s) Signed: 11/06/2016 3:57:57 PM By: Montey Hora Entered By: Montey Hora on 11/06/2016 13:28:54

## 2016-11-06 NOTE — Progress Notes (Addendum)
Adam Merritt, Adam Merritt ([id number]) Visit Report for 11/06/2016 Chief Complaint Document Details Patient Name: Adam, Merritt Date of Service: 11/06/2016 1:30 PM Medical Record Number: [id number] Patient Account Number: [id number] Date of Birth/Sex: 03/30/1946 (71 y.o. Male) Treating RN: Adam Merritt Primary Care Provider: Clayborn Merritt Other Clinician: Referring Provider: Clayborn Merritt Treating Provider/Extender: Adam Merritt in Treatment: 5 Information Obtained from: Patient Chief Complaint the patient comes along with his wife for excessive swelling of both lower extremities left more than right and weeping of fluid for over 2 months now Electronic Signature(s) Signed: 11/06/2016 2:02:28 PM By: Adam Fudge MD, FACS Entered By: Adam Merritt on 11/06/2016 14:02:28 Adam Merritt ([id number]) -------------------------------------------------------------------------------- HPI Details Patient Name: Adam Merritt Date of Service: 11/06/2016 1:30 PM Medical Record Number: [id number] Patient Account Number: [id number] Date of Birth/Sex: 04/23/1946 (71 y.o. Male) Treating RN: Adam Merritt Primary Care Provider: Clayborn Merritt Other Clinician: Referring Provider: Clayborn Merritt Treating Provider/Extender: Adam Merritt in Treatment: 5 History of Present Illness Location: bilateral lower leg edema with some blisters recently and weeping of fluid left side more than right Quality: Patient reports experiencing a dull pain to affected area(s). Severity: Patient states wound are getting worse. Duration: Patient has had the wound for > 3 months prior to seeking treatment at the wound center Timing: Pain in wound is Intermittent (comes and goes Context: The wound appeared gradually over time Modifying Factors: Consults to this date include: he has been seen by his PCP and she sent him off to see [place]. Associated Signs and Symptoms: Patient reports having difficulty standing for long  periods. HPI Description: 71 year old gentleman seen earlier at our clinic in June 2016 and again in June 2017 is noncompliant with recommendations and does not follow-up regularly. 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 He had referred him to the lymphedema clinic and he was accepted but he did not follow-up with them. At that stage he had agreed to get a repeat arterial study done and he has seen Dr. Trula Merritt in [place]. This time around he has been seen by his PCP Dr. Tiburcio Merritt who referred him urgently to [place] for bilateral lower extremity cellulitis in spite of being on antibiotics -- he was placed on Keflex and 500 mg 3 times a day for 10 days. His comorbidities include coronary artery disease, diabetes mellitus, hypertension, Bell's palsy and chronic lymphedema. he was also recently admitted to the hospital on April 13 and kept overnight with the diagnosis of a TIA and a left ICA stenosis to follow-up with vascular surgery as an outpatient 10/05/2016 -- patient has been compliant this week and left his compression and dressing on for the entire week and had home health change it appropriately. ==== Old Notes: 71 year old gentleman who comes with bilateral lower limb edema for over 20 years and recent attack of blisters and redness for about 2 weeks. The patient says he has had bilateral lower limb edema since 1996. He has been noncompliant with wearing wraps in the past and even was given a boot to wear at night which she never tolerated and didn't wear it. He has a past medical history of sleep apnea, neuropathy with diabetes, hypertension, hyperlipidemia, coronary artery disease, varicose veins, carotid artery disease and is status post  CABG and cholecystectomy. He was  seen by the vascular surgeon on 09/29/2013 and Dr. Trula Merritt opinion was:"I have reviewed his TAVORIS, BRISK ([id number]) outside ultrasound which shows an ankle-brachial index of 0.72 on the left and 1.0 on the right.o This study was repeated at our office.o The ankle-brachial index on the right was 1.26 with triphasic waveforms.o On the left it was a 1.04 with biphasic waveforms.o The patient had a toe pressure of 121 on the right and 100 on the left. In summary, I would not recommend any further imaging , as I do not think his symptoms are related to arterial insufficiency, but rather secondary to diabetic neuropathy.o The patient still has room for increasing his Neurontin dose, which he states does help him.o I have scheduled him to come back in one year for a repeat arterial evaluation." in June 2016, a year ago, he has a venous duplex study to be done. He was also going to be accepted to the lymphedema clinic but I did not believe the patient went regularly for this. ==== Electronic Signature(s) Signed: 11/06/2016 2:02:34 PM By: Adam Fudge MD, FACS Entered By: Adam Merritt on 11/06/2016 14:02:34 Adam Merritt ([id number]) -------------------------------------------------------------------------------- Physical Exam Details Patient Name: Adam Merritt Date of Service: 11/06/2016 1:30 PM Medical Record Number: [id number] Patient Account Number: [id number] Date of Birth/Sex: [birth year] (71 y.o. Male) Treating RN: Adam Merritt Primary Care Provider: Clayborn Merritt Other Clinician: Referring Provider: Clayborn Merritt Treating Provider/Extender: Adam Merritt in Treatment: 5 Constitutional . Pulse regular. Respirations normal and unlabored. Afebrile. . Eyes Nonicteric. Reactive to light. Ears, Nose, Mouth, and Throat Lips, teeth, and gums WNL.Marland Kitchen Moist mucosa without lesions. Neck supple and nontender. No palpable supraclavicular or cervical adenopathy. Normal sized without  goiter. Respiratory WNL. No retractions.. Breath sounds WNL, No rubs, rales, rhonchi, or wheeze.. Cardiovascular Heart rhythm and rate regular, no murmur or gallop.. Pedal Pulses WNL. No clubbing, cyanosis or edema. Chest Breasts symmetical and no nipple discharge.. Breast tissue WNL, no masses, lumps, or tenderness.. Lymphatic No adneopathy. No adenopathy. No adenopathy. Musculoskeletal Adexa without tenderness or enlargement.. Digits and nails w/o clubbing, cyanosis, infection, petechiae, ischemia, or inflammatory conditions.. Integumentary (Hair, Skin) No suspicious lesions. No crepitus or fluctuance. No peri-wound warmth or erythema. No masses.Marland Kitchen Psychiatric Judgement and insight Intact.. No evidence of depression, anxiety, or agitation.. Notes the left leg continues to be much bigger and causing fluid much more from his ulceration on the lateral calf. The right leg has minimal opening medially. Electronic Signature(s) Signed: 11/06/2016 2:03:05 PM By: Adam Fudge MD, FACS Entered By: Adam Merritt on 11/06/2016 14:03:05 Adam Merritt ([id number]) -------------------------------------------------------------------------------- Physician Orders Details Patient Name: Adam Merritt, Adam Merritt. Date of Service: 11/06/2016 1:30 PM Medical Record Number: [id number] Patient Account Number: [id number] Date of Birth/Sex: November 26, 1945 (71 y.o. 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 o Cleanse wound with mild soap and water Wound #3 Right Lower Leg o Clean wound with Normal Saline. o Cleanse wound with mild soap  and water Primary Wound Dressing Wound #2 Left,Circumferential Lower Leg o Aquacel Ag Wound #3 Right Lower Leg o Aquacel Ag Secondary Dressing Wound #2 Left,Circumferential Lower Leg o ABD pad o XtraSorb - HHRN PLEASE PROVIDE XTRASORB FOR PATIENT Wound #3 Right Lower Leg o ABD pad o XtraSorb - HHRN PLEASE PROVIDE XTRASORB FOR PATIENT Dressing Change Frequency Wound #2 Left,Circumferential Lower Leg MARGUES, FILIPPINI ([id number]) o Three times weekly Wound #3 Right Lower Leg o Three times weekly Follow-up Appointments Wound #2 Left,Circumferential Lower Leg o Return Appointment in 1 week. Wound #3 Right Lower Leg o Return Appointment in 1 week. Edema Control Wound #2 Left,Circumferential Lower Leg o Kerlix and Coban - Bilateral - may anchor with unna paste Wound #3 Right Lower Leg o Patient to wear own Juxtalite/Juzo compression garment. - HHRN to teach patient and spouse how to apply juxtalite once available Additional Orders / Instructions Wound #2 Left,Circumferential Lower Leg o Increase protein intake. o Other: - please work to keep blood sugars below 180 Wound #3 Right Lower Leg o Increase protein intake. o Other: - please work to keep blood sugars below [place] #2 Left,Circumferential Lower Leg o [place] Visits - [place] Nurse may visit PRN to address patientos wound care needs. o FACE TO FACE ENCOUNTER: MEDICARE and MEDICAID PATIENTS: I certify that this patient is under my care and that I had a face-to-face encounter that meets the physician face-to-face encounter requirements with this patient on this date. The encounter with the patient was in whole  or in part for the following MEDICAL CONDITION: (primary reason for [place]) MEDICAL NECESSITY: I certify, that based on my findings, NURSING services are a medically necessary home health service. HOME BOUND STATUS: I certify that my clinical findings support that this patient is homebound (i.e., Due to illness or injury, pt requires aid of supportive devices such as crutches, cane, wheelchairs, walkers, the use of special transportation or the assistance of another person to leave their place of residence. There is a normal inability to leave the home and doing so requires considerable and taxing effort. Other absences are for medical reasons / religious services and are infrequent or of short duration when for other reasons). o If current dressing causes regression in wound condition, may D/C ordered dressing product/s and apply Normal Saline Moist Dressing daily until next [place] / Other MD appointment. [employer] of regression in wound condition at [place] ([id number]) o Please direct any NON-WOUND related issues/requests for orders to patient's Primary Care Physician Wound #3 Right Lower Leg o [place] Visits - [place] Nurse may visit PRN to address patientos wound care needs. o FACE TO FACE ENCOUNTER: MEDICARE and MEDICAID PATIENTS: I certify that this patient is under my care and that I had a face-to-face encounter that meets the physician face-to-face encounter requirements with this patient on this date. The encounter with the patient was in whole or in part for the following MEDICAL CONDITION: (primary reason for [place]) MEDICAL NECESSITY: I certify, that based on my findings, NURSING services are a medically necessary home health service. HOME BOUND STATUS: I certify that my clinical findings support that this patient is homebound (i.e., Due to illness or injury, pt requires  aid of supportive devices such as crutches, cane, wheelchairs, walkers, the use of special transportation or the assistance of another person to leave their place of residence. There  is a normal inability to leave the home and doing so requires considerable and taxing effort. Other absences are for medical reasons / religious services and are infrequent or of short duration when for other reasons). o If current dressing causes regression in wound condition, may D/C ordered dressing product/s and apply Normal Saline Moist Dressing daily until next [place] / Other MD appointment. [place] of regression in wound condition at [phone]. o Please direct any NON-WOUND related issues/requests for orders to patient's Primary Care Physician Electronic Signature(s) Signed: 11/06/2016 3:36:12 PM By: Adam Fudge MD, FACS Signed: 11/06/2016 3:57:57 PM By: Adam Merritt Entered By: Adam Merritt on 11/06/2016 14:14:35 Adam Merritt ([id number]) -------------------------------------------------------------------------------- Problem List Details Patient Name: Adam Merritt Date of Service: 11/06/2016 1:30 PM Medical Record Number: [id number] Patient Account Number: [id number] Date of Birth/Sex: 10/03/1945 (71 y.o. 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 Male) Treating RN: Adam Merritt Primary Care Provider: Clayborn Merritt Other Clinician: Referring Provider: Clayborn Merritt Treating Provider/Extender: Adam Merritt in Treatment: 5 Subjective Chief Complaint Information obtained from Patient the patient comes along with his wife for excessive swelling of both lower extremities left more than right and weeping of fluid for over 2 months now History of Present Illness (HPI) The following HPI elements were documented for the patient's wound: Location: bilateral lower leg edema with some blisters recently and weeping of fluid left side more than right Quality: Patient reports experiencing a dull pain to affected area(s). Severity: Patient states wound are getting worse. Duration: Patient has had the wound for > 3 months prior to seeking treatment at the wound center Timing: Pain in wound is Intermittent (comes and goes Context: The wound appeared gradually over time Modifying Factors: Consults to this date include: he has been seen by his PCP and she sent him off to see [place]. Associated Signs and Symptoms: Patient reports having difficulty standing for long periods. 71 year old gentleman seen earlier at our clinic in June 2016 and again in June 2017 is noncompliant with recommendations and does not follow-up regularly. 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 He had referred him to the lymphedema clinic and he was accepted but he did not follow-up with them. At that stage he had agreed to get a repeat arterial study done and he has seen Dr. Trula Merritt in [place]. This time around he has been seen by his PCP Dr. Tiburcio Merritt who referred him urgently to [place] for  bilateral lower extremity cellulitis in spite of being on antibiotics -- he was placed on Keflex and 500 mg 3 times a day for 10 days. His comorbidities include coronary artery disease, diabetes mellitus, hypertension, Bell's palsy and chronic lymphedema. he was also recently admitted to the hospital on April 13 and kept overnight with the diagnosis of a TIA and a left ICA stenosis to follow-up with vascular surgery as an outpatient 10/05/2016 -- patient has been compliant this week and left his compression and dressing on for the entire week and had home health change it appropriately. ==== Old Notes: 71 year old gentleman who comes with bilateral lower limb edema for over 20 years and recent attack of blisters and redness for about 2 weeks. Adam Merritt, Adam Merritt ([id number]) The patient says he has had bilateral lower limb edema since 1996. He has been noncompliant with wearing wraps in the past and even was given a boot to wear at night which she never tolerated and didn't wear it. He has a past medical history of sleep apnea, neuropathy with diabetes, hypertension, hyperlipidemia, coronary artery disease, varicose veins, carotid artery disease and is status post CABG and cholecystectomy. He was seen by the vascular surgeon on 09/29/2013 and Dr. Trula Merritt opinion was:"I have reviewed his outside ultrasound which shows an ankle-brachial index of 0.72 on the left and 1.0 on the right. This study was repeated at our office. The ankle-brachial index on the right was 1.26 with triphasic waveforms. On the left it was a 1.04  with biphasic waveforms. The patient had a toe pressure of 121 on the right and 100 on the left. In summary, I would not recommend any further imaging , as I do not think his symptoms are related to arterial insufficiency, but rather secondary to diabetic neuropathy. The patient still has room for increasing his Neurontin dose, which he states does help him. I have scheduled him to come back in one year for a repeat arterial evaluation." in June 2016, a year ago, he has a venous duplex study to be done. He was also going to be accepted to the lymphedema clinic but I did not believe the patient went regularly for this. ==== Objective Constitutional Pulse regular. Respirations normal and unlabored. Afebrile. Vitals Time Taken: 1:27 PM, Height: 65 in, Weight: 336 lbs, BMI: 55.9, Temperature: 98.3 F, Pulse: 85 bpm, Respiratory Rate: 18 breaths/min, Blood Pressure: 143/65 mmHg. Eyes Nonicteric. Reactive to light. Ears, Nose, Mouth, and Throat Lips, teeth, and gums WNL.Marland Kitchen Moist mucosa without lesions. Neck supple and nontender. No palpable supraclavicular or cervical adenopathy. Normal sized without goiter. Respiratory WNL. No retractions.. Breath sounds WNL, No rubs, rales, rhonchi, or wheeze.. Cardiovascular Heart rhythm and rate regular, no murmur or gallop.. Pedal Pulses WNL. No clubbing, cyanosis or edema. Chest Breasts symmetical and no nipple discharge.. Breast tissue WNL, no masses, lumps, or tenderness.Adam Merritt, Adam Merritt ([id number]) Lymphatic No adneopathy. No adenopathy. No adenopathy. Musculoskeletal Adexa without tenderness or enlargement.. Digits and nails w/o clubbing, cyanosis, infection, petechiae, ischemia, or inflammatory conditions.Marland Kitchen Psychiatric Judgement and insight Intact.. No evidence of depression, anxiety, or agitation.. General Notes: the left leg continues to be much bigger and causing fluid much more from his ulceration on the lateral calf. The right leg has  minimal opening medially. Integumentary (Hair, Skin) No suspicious lesions. No crepitus or fluctuance. No peri-wound warmth or erythema. No masses.. Wound #2 status is Open. Original cause of wound was Gradually Appeared. The wound is located on the Left,Circumferential Lower Leg. The  wound measures 8cm length x 34cm width x 0.1cm depth; 213.628cm^2 area and 21.363cm^3 volume. The wound is limited to skin breakdown. There is no tunneling or undermining noted. There is a large amount of serous drainage noted. The wound margin is flat and intact. There is medium (34-66%) pink granulation within the wound bed. There is a medium (34-66%) amount of necrotic tissue within the wound bed including Adherent Slough. The periwound skin appearance exhibited: Maceration. The periwound skin appearance did not exhibit: Callus, Crepitus, Excoriation, Induration, Rash, Scarring, Dry/Scaly, Atrophie Blanche, Cyanosis, Ecchymosis, Hemosiderin Staining, Mottled, Pallor, Rubor, Erythema. Periwound temperature was noted as No Abnormality. The periwound has tenderness on palpation. Wound #3 status is Open. Original cause of wound was Gradually Appeared. The wound is located on the Right Lower Leg. The wound measures 2cm length x 1cm width x 0.1cm depth; 1.571cm^2 area and 0.157cm^3 volume. The wound is limited to skin breakdown. There is no tunneling or undermining noted. There is a large amount of serous drainage noted. The wound margin is flat and intact. There is medium (34-66%) red granulation within the wound bed. There is a medium (34-66%) amount of necrotic tissue within the wound bed. The periwound skin appearance did not exhibit: Callus, Crepitus, Excoriation, Induration, Rash, Scarring, Dry/Scaly, Maceration, Atrophie Blanche, Cyanosis, Ecchymosis, Hemosiderin Staining, Mottled, Pallor, Rubor, Erythema. Periwound temperature was noted as No Abnormality. The periwound has tenderness on  palpation. 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 Cleanse wound with mild soap and water Wound #3 Right Lower Leg: Clean wound with Normal Saline. Cleanse wound with mild soap and water Primary Wound Dressing: Wound #2 Left,Circumferential Lower Leg: Aquacel Ag Wound #3 Right Lower Leg: Aquacel Ag Secondary Dressing: Wound #2 Left,Circumferential Lower Leg: ABD pad XtraSorb - HHRN PLEASE PROVIDE XTRASORB FOR PATIENT Wound #3 Right Lower Leg: ABD pad XtraSorb - HHRN PLEASE PROVIDE XTRASORB FOR PATIENT Dressing Change Frequency: Wound #2 Left,Circumferential Lower Leg: Three times weekly Wound #3 Right Lower Leg: Three times weekly Follow-up Appointments: Wound #2 Left,Circumferential Lower Leg: Return Appointment in 1 week. Wound #3 Right Lower Leg: Return Appointment in 1 week. Edema Control: Wound #2 Left,Circumferential Lower Leg: Kerlix and Coban - Bilateral - may anchor with unna paste Wound #3 Right Lower Leg: Patient to wear own Juxtalite/Juzo compression garment. - HHRN to teach patient and spouse how to apply juxtalite once available Additional Orders / Instructions: Wound #2 Left,Circumferential Lower Leg: Increase protein intake. Other: - please work to keep blood sugars below 180 Wound #3 Right Lower Leg: Adam Merritt, Adam Merritt. ([id number]) Increase protein intake. Other: - please work to keep blood sugars below [place]: Wound #2 Left,Circumferential Lower Leg: Continue Home Health Visits - [place] Nurse  may visit PRN to address patient s wound care needs. FACE TO FACE ENCOUNTER: MEDICARE and MEDICAID PATIENTS: I certify that this patient is under my care and that I had a face-to-face encounter that meets the physician face-to-face encounter requirements with this patient on this date. The encounter with the patient was in whole or in part for the following MEDICAL CONDITION: (primary reason for [place])  MEDICAL NECESSITY: I certify, that based on my findings, NURSING services are a medically necessary home health service. HOME BOUND STATUS: I certify that my clinical findings support that this patient is homebound (i.e., Due to illness or injury, pt requires aid of supportive devices such as crutches, cane, wheelchairs, walkers, the use of special transportation or the assistance of another person to leave their place of residence. There is a normal inability to leave the home and doing so requires considerable and taxing effort. Other absences are for medical reasons / religious services and are infrequent or of short duration when for other reasons). If current dressing causes regression in wound condition, may D/C ordered dressing product/s and apply Normal Saline Moist Dressing daily until next [place] / Other MD appointment. [place] of regression in wound condition at [phone]. Please direct any NON-WOUND related issues/requests for orders to patient's Primary Care Physician Wound #3 Right Lower Leg: [place] Visits - [place] Nurse may visit PRN to address patient s wound care needs. FACE TO FACE ENCOUNTER: MEDICARE and MEDICAID PATIENTS: I certify that this patient is under my care and that I had a face-to-face encounter that meets the physician face-to-face encounter requirements with this patient on this date. The encounter with the patient was in whole or in part for the following MEDICAL CONDITION: (primary reason  for [place]) MEDICAL NECESSITY: I certify, that based on my findings, NURSING services are a medically necessary home health service. HOME BOUND STATUS: I certify that my clinical findings support that this patient is homebound (i.e., Due to illness or injury, pt requires aid of supportive devices such as crutches, cane, wheelchairs, walkers, the use of special transportation or the assistance of another person to leave their place of residence. There is a normal inability to leave the home and doing so requires considerable and taxing effort. Other absences are for medical reasons / religious services and are infrequent or of short duration when for other reasons). If current dressing causes regression in wound condition, may D/C ordered dressing product/s and apply Normal Saline Moist Dressing daily until next [place] / Other MD appointment. [place] of regression in wound condition at [phone]. Please direct any NON-WOUND related issues/requests for orders to patient's Primary Care Physician He has not yet received his juxta light stockings and I have asked our office to look into this and reorder them if need be I have recommended: 1. Silver alginate and a light Kerlix and Coban dressing to both lower extremities. 2. order juxta lites for the right lower extremity Adam Merritt, Adam Merritt. ([id number]) 3. Good control of his diabetes mellitus 4. Elevation and exercise. Electronic Signature(s) Signed: 11/06/2016 3:40:42 PM By: Adam Fudge MD, FACS Previous Signature: 11/06/2016 2:03:58 PM Version By: Adam Fudge MD, FACS Entered By: Adam Merritt on 11/06/2016 15:40:42 Adam Merritt ([id number]) -------------------------------------------------------------------------------- SuperBill Details Patient Name: Adam Merritt Date of Service: 11/06/2016 Medical Record Number: [id number] Patient Account Number: [id number] Date of Birth/Sex: 06/13/1945 (71 y.o.  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  Facility Procedures CPT4 Code: 85927639 Description: 43200 - WOUND CARE VISIT-LEV 4 EST PT Modifier: Quantity: 1 Physician Procedures CPT4 Code Description: [id number] 19012 - WC PHYS LEVEL 3 - EST PT ICD-10 Description Diagnosis 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

## 2016-11-13 ENCOUNTER — Encounter: Payer: Medicare Other | Admitting: Surgery

## 2016-11-13 DIAGNOSIS — L97221 Non-pressure chronic ulcer of left calf limited to breakdown of skin: Secondary | ICD-10-CM | POA: Diagnosis not present

## 2016-11-14 NOTE — Progress Notes (Signed)
KALID, GHAN ([id number]) Visit Report for 11/13/2016 Chief Complaint Document Details Patient Name: Adam Merritt, Adam Merritt Date of Service: 11/13/2016 3:00 PM Medical Record Number: [id number] Patient Account Number: [id number] Date of Birth/Sex: 1945-08-19 (71 y.o. Male) Treating RN: Montey Hora Primary Care Provider: [name] Other Clinician: Referring Provider: [name] Treating Provider/Extender: Frann Rider in Treatment: 6 Information Obtained from: Patient Chief Complaint the patient comes along with his wife for excessive swelling of both lower extremities left more than right and weeping of fluid for over 2 months now Electronic Signature(s) Signed: 11/13/2016 3:27:13 PM By: Christin Fudge MD, FACS Entered By: Christin Fudge on 11/13/2016 15:27:13 Adam Merritt ([id number]) -------------------------------------------------------------------------------- HPI Details Patient Name: Adam Merritt Date of Service: 11/13/2016 3:00 PM Medical Record Number: [id number] Patient Account Number: [id number] Date of Birth/Sex: 16-Jun-1945 (71 y.o. Male) Treating RN: Montey Hora Primary Care Provider: [name] Other Clinician: Referring Provider: [name] Treating Provider/Extender: Frann Rider in Treatment: 6 History of Present Illness Location: bilateral lower leg edema with some blisters recently and weeping of fluid left side more than right Quality: Patient reports experiencing a dull pain to affected area(s). Severity: Patient states wound are getting worse. Duration: Patient has had the wound for > 3 months prior to seeking treatment at the wound center Timing: Pain in wound is Intermittent (comes and goes Context: The wound appeared gradually over time Modifying Factors: Consults to this date include: he has been seen by his PCP and she sent him off to see [place]. Associated Signs and Symptoms: Patient reports having difficulty standing for long  periods. HPI Description: 71 year old gentleman seen earlier at our clinic in June 2016 and again in June 2017 is noncompliant with recommendations and does not follow-up regularly. 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 He had referred him to the lymphedema clinic and he was accepted but he did not follow-up with them. At that stage he had agreed to get a repeat arterial study done and he has seen Dr. Trula Slade in [place]. This time around he has been seen by his PCP Dr. Tiburcio Bash who referred him urgently to [place] for bilateral lower extremity cellulitis in spite of being on antibiotics -- he was placed on Keflex and 500 mg 3 times a day for 10 days. His comorbidities include coronary artery disease, diabetes mellitus, hypertension, Bell's palsy and chronic lymphedema. he was also recently admitted to the hospital on April 13 and kept overnight with the diagnosis of a TIA and a left ICA stenosis to follow-up with vascular surgery as an outpatient 10/05/2016 -- patient has been compliant this week and left his compression and dressing on for the entire week and had home health change it appropriately. 11/13/2016 -- his insurance will not cover compression stockings for lymphedema and hence we will have to send him to the lymphedema clinic as soon as we have finished with his healing. ==== Old Notes: 71 year old gentleman who comes with bilateral lower limb edema for over 20 years and recent attack of blisters and redness for about 2 weeks. The patient says he has had bilateral lower limb edema since 1996. He has been noncompliant with wearing wraps in the past and even was given a boot to wear at night which she  never tolerated and didn't wear it. He has a past medical history of  sleep apnea, neuropathy with diabetes, hypertension, hyperlipidemia, coronary artery disease, varicose veins, carotid artery disease and is status post CABG and Adam Merritt, Adam Merritt. ([id number]) cholecystectomy. He was seen by the vascular surgeon on 09/29/2013 and Dr. Trula Slade opinion was:"I have reviewed his outside ultrasound which shows an ankle-brachial index of 0.72 on the left and 1.0 on the right.o This study was repeated at our office.o The ankle-brachial index on the right was 1.26 with triphasic waveforms.o On the left it was a 1.04 with biphasic waveforms.o The patient had a toe pressure of 121 on the right and 100 on the left. In summary, I would not recommend any further imaging , as I do not think his symptoms are related to arterial insufficiency, but rather secondary to diabetic neuropathy.o The patient still has room for increasing his Neurontin dose, which he states does help him.o I have scheduled him to come back in one year for a repeat arterial evaluation." in June 2016, a year ago, he has a venous duplex study to be done. He was also going to be accepted to the lymphedema clinic but I did not believe the patient went regularly for this. ==== Electronic Signature(s) Signed: 11/13/2016 3:27:50 PM By: Christin Fudge MD, FACS Entered By: Christin Fudge on 11/13/2016 15:27:49 Adam Merritt ([id number]) -------------------------------------------------------------------------------- Physical Exam Details Patient Name: Adam Merritt Date of Service: 11/13/2016 3:00 PM Medical Record Number: [id number] Patient Account Number: [id number] Date of Birth/Sex: [birth year] (71 y.o. Male) Treating RN: Montey Hora Primary Care Provider: [name] Other Clinician: Referring Provider: [name] Treating Provider/Extender: Frann Rider in Treatment: 6 Constitutional . Pulse regular. Respirations normal and unlabored. Afebrile. . Eyes Nonicteric. Reactive to light. Ears,  Nose, Mouth, and Throat Lips, teeth, and gums WNL.Marland Kitchen Moist mucosa without lesions. Neck supple and nontender. No palpable supraclavicular or cervical adenopathy. Normal sized without goiter. Respiratory WNL. No retractions.. Breath sounds WNL, No rubs, rales, rhonchi, or wheeze.. Cardiovascular Heart rhythm and rate regular, no murmur or gallop.. Pedal Pulses WNL. No clubbing, cyanosis or edema. Chest Breasts symmetical and no nipple discharge.. Breast tissue WNL, no masses, lumps, or tenderness.. Gastrointestinal (GI) Abdomen without masses or tenderness.. No liver or spleen enlargement or tenderness.. Genitourinary (GU) No hydrocele, spermatocele, tenderness of the cord, or testicular mass.Marland Kitchen Penis without lesions.Lowella Fairy without lesions. No cystocele, or rectocele. Pelvic support intact, no discharge.Marland Kitchen Urethra without masses, tenderness or scarring.Marland Kitchen Lymphatic No adneopathy. No adenopathy. No adenopathy. Musculoskeletal Adexa without tenderness or enlargement.. Digits and nails w/o clubbing, cyanosis, infection, petechiae, ischemia, or inflammatory conditions.. Integumentary (Hair, Skin) No suspicious lesions. No crepitus or fluctuance. No peri-wound warmth or erythema. No masses.Marland Kitchen Psychiatric Judgement and insight Intact.. No evidence of depression, anxiety, or agitation.. Notes the left leg is looking much better and has micro-ulcerations with minimal oozing Electronic Signature(s) Signed: 11/13/2016 3:28:12 PM By: Christin Fudge MD, FACS Adam Merritt, Adam Merritt ([id number]) Entered By: Christin Fudge on 11/13/2016 15:28:10 Adam Merritt ([id number]) -------------------------------------------------------------------------------- Physician Orders Details Patient Name: Adam Merritt Date of Service: 11/13/2016 3:00 PM Medical Record Number: [id number] Patient Account Number: [id number] Date of Birth/Sex: [birth year] (71 y.o. Male) Treating RN: Montey Hora Primary Care Provider:  [name] Other Clinician: Referring Provider: [name] Treating Provider/Extender: Frann Rider in Treatment: 6 Verbal / Phone Orders: No Diagnosis Coding Wound Cleansing Wound #2 Left,Circumferential Lower Leg o Clean wound with Normal Saline. o Cleanse wound with mild  soap and water Wound #3 Right Lower Leg o Clean wound with Normal Saline. o Cleanse wound with mild soap and water Primary Wound Dressing Wound #2 Left,Circumferential Lower Leg o Aquacel Ag Wound #3 Right Lower Leg o Aquacel Ag Secondary Dressing Wound #2 Left,Circumferential Lower Leg o ABD pad o XtraSorb - HHRN PLEASE PROVIDE XTRASORB FOR PATIENT Wound #3 Right Lower Leg o ABD pad o XtraSorb - HHRN PLEASE PROVIDE XTRASORB FOR PATIENT Dressing Change Frequency Wound #2 Left,Circumferential Lower Leg o Three times weekly Wound #3 Right Lower Leg o Three times weekly Follow-up Appointments Wound #2 Left,Circumferential Lower Leg o Return Appointment in 1 week. Adam Merritt, Adam Merritt ([id number]) Wound #3 Right Lower Leg o Return Appointment in 1 week. Edema Control Wound #2 Left,Circumferential Lower Leg o Kerlix and Coban - Bilateral - may anchor with unna paste Wound #3 Right Lower Leg o Patient to wear own Juxtalite/Juzo compression garment. - HHRN to teach patient and spouse how to apply juxtalite once available Additional Orders / Instructions Wound #2 Left,Circumferential Lower Leg o Increase protein intake. o Other: - please work to keep blood sugars below 180 Wound #3 Right Lower Leg o Increase protein intake. o Other: - please work to keep blood sugars below [place] #2 Left,Circumferential Lower Leg o [place] Visits - [place] Nurse may visit PRN to address patientos wound care needs. o FACE TO FACE ENCOUNTER: MEDICARE and MEDICAID PATIENTS: I certify that this patient is under my care and that I  had a face-to-face encounter that meets the physician face-to-face encounter requirements with this patient on this date. The encounter with the patient was in whole or in part for the following MEDICAL CONDITION: (primary reason for [place]) MEDICAL NECESSITY: I certify, that based on my findings, NURSING services are a medically necessary home health service. HOME BOUND STATUS: I certify that my clinical findings support that this patient is homebound (i.e., Due to illness or injury, pt requires aid of supportive devices such as crutches, cane, wheelchairs, walkers, the use of special transportation or the assistance of another person to leave their place of residence. There is a normal inability to leave the home and doing so requires considerable and taxing effort. Other absences are for medical reasons / religious services and are infrequent or of short duration when for other reasons). o If current dressing causes regression in wound condition, may D/C ordered dressing product/s and apply Normal Saline Moist Dressing daily until next [place] / Other MD appointment. [place] of regression in wound condition at [phone]. o Please direct any NON-WOUND related issues/requests for orders to patient's Primary Care Physician Wound #3 Right Lower Leg o [place] Visits - [place] Nurse may visit PRN to address patientos wound care needs. o FACE TO FACE ENCOUNTER: MEDICARE and MEDICAID PATIENTS: I certify that this patient is under my care and that I had a face-to-face encounter that meets the physician face-to-face Adam Merritt, Adam Merritt ([id number]) encounter requirements with this patient on this date. The encounter with the patient was in whole or in part for the following MEDICAL CONDITION: (primary reason for [place]) MEDICAL NECESSITY: I certify, that based on my findings, NURSING services are a  medically necessary home health service. HOME BOUND STATUS: I certify that my clinical findings support that this patient is homebound (i.e., Due to illness or injury, pt requires aid of supportive devices such as crutches, cane,  wheelchairs, walkers, the use of special transportation or the assistance of another person to leave their place of residence. There is a normal inability to leave the home and doing so requires considerable and taxing effort. Other absences are for medical reasons / religious services and are infrequent or of short duration when for other reasons). o If current dressing causes regression in wound condition, may D/C ordered dressing product/s and apply Normal Saline Moist Dressing daily until next [place] / Other MD appointment. [place] of regression in wound condition at [phone]. o Please direct any NON-WOUND related issues/requests for orders to patient's Primary Care Physician Electronic Signature(s) Signed: 11/13/2016 4:03:37 PM By: Christin Fudge MD, FACS Signed: 11/13/2016 4:59:03 PM By: Montey Hora Entered By: Montey Hora on 11/13/2016 15:21:13 Adam Merritt ([id number]) -------------------------------------------------------------------------------- Problem List Details Patient Name: Adam Merritt Date of Service: 11/13/2016 3:00 PM Medical Record Number: [id number] Patient Account Number: [id number] Date of Birth/Sex: [birth year] (71 y.o. 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 Male) Treating RN: Montey Hora Primary Care Provider: [name] Other Clinician: Referring Provider: [name] Treating Provider/Extender: Frann Rider in Treatment: 6 Subjective Chief Complaint Information obtained from Patient the patient comes along with his wife for excessive swelling of both lower extremities left more than right and weeping of fluid for over 2 months now History of Present Illness (HPI) The following HPI elements were documented for the patient's wound: Location: bilateral lower leg edema with some blisters recently and weeping of fluid left side more than right Quality: Patient reports experiencing a dull pain to affected area(s). Severity: Patient states wound are getting worse. Duration: Patient has had the wound for > 3 months prior to seeking treatment at the wound center Timing: Pain in wound is Intermittent (comes and goes Context: The wound appeared gradually over time Modifying Factors: Consults to this date include: he has been seen by his PCP and she sent him off to see [place]. Associated Signs and Symptoms: Patient reports having difficulty standing for long periods. 71 year old gentleman seen earlier at our clinic in June 2016 and again in June 2017 is noncompliant with recommendations and does not follow-up  regularly. 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 He had referred him to the lymphedema clinic and he was accepted but he did not follow-up with them. At that stage he had agreed to get a repeat arterial study done and he has seen Dr. Trula Slade in [place].  This time around he has been seen by his PCP Dr. Tiburcio Bash who referred him urgently to [place] for bilateral lower extremity cellulitis in spite of being on antibiotics -- he was placed on Keflex and 500 mg 3 times a day for 10 days. His comorbidities include coronary artery disease, diabetes mellitus, hypertension, Bell's palsy and chronic lymphedema. he was also recently admitted to the hospital on April 13 and kept overnight with the diagnosis of a TIA and a left ICA stenosis to follow-up with vascular surgery as an outpatient 10/05/2016 -- patient has been compliant this week and left his compression and dressing on for the entire week and had home health change it appropriately. 11/13/2016 -- his insurance will not cover compression stockings for lymphedema and hence we will have to send him to the lymphedema clinic as soon as we have finished with his healing. ==== Old Notes: Adam Merritt, Adam Merritt ([id number]) 71 year old gentleman who comes with bilateral lower limb edema for over 20 years and recent attack of blisters and redness for about 2 weeks. The patient says he has had bilateral lower limb edema since 1996. He has been noncompliant with wearing wraps in the past and even was given a boot to wear at night which she never tolerated and didn't wear it. He has a past medical history of sleep apnea, neuropathy with diabetes, hypertension, hyperlipidemia, coronary artery disease, varicose veins, carotid artery disease and is status  post CABG and cholecystectomy. He was seen by the vascular surgeon on 09/29/2013 and Dr. Trula Slade opinion was:"I have reviewed his outside ultrasound which shows an ankle-brachial index of 0.72 on the left and 1.0 on the right. This study was repeated at our office. The ankle-brachial index on the right was 1.26 with triphasic waveforms. On the left it was a 1.04 with biphasic waveforms. The patient had a toe pressure of 121 on the right and 100 on the left. In summary, I would not recommend any further imaging , as I do not think his symptoms are related to arterial insufficiency, but rather secondary to diabetic neuropathy. The patient still has room for increasing his Neurontin dose, which he states does help him. I have scheduled him to come back in one year for a repeat arterial evaluation." in June 2016, a year ago, he has a venous duplex study to be done. He was also going to be accepted to the lymphedema clinic but I did not believe the patient went regularly for this. ==== Objective Constitutional Pulse regular. Respirations normal and unlabored. Afebrile. Vitals Time Taken: 2:56 PM, Height: 65 in, Weight: 336 lbs, BMI: 55.9, Temperature: 98.4 F, Pulse: 82 bpm, Respiratory Rate: 20 breaths/min, Blood Pressure: 143/68 mmHg. Eyes Nonicteric. Reactive to light. Ears, Nose, Mouth, and Throat Lips, teeth, and gums WNL.Marland Kitchen Moist mucosa without lesions. Neck supple and nontender. No palpable supraclavicular or cervical adenopathy. Normal sized without goiter. Respiratory WNL. No retractions.. Breath sounds WNL, No rubs, rales, rhonchi, or wheeze.. Cardiovascular Heart rhythm and rate regular, no murmur or gallop.. Pedal Pulses WNL. No clubbing, cyanosis or edema. Adam Merritt, Adam Merritt ([id number]) Chest Breasts symmetical and no nipple discharge.. Breast tissue WNL, no masses, lumps, or tenderness.. Gastrointestinal (GI) Abdomen without masses or tenderness.. No liver or spleen enlargement  or tenderness.. Genitourinary (GU) No hydrocele, spermatocele, tenderness of the cord, or testicular mass.Marland Kitchen Penis without lesions.Lowella Fairy without lesions. No cystocele, or rectocele. Pelvic support intact, no discharge.Marland Kitchen Urethra without masses, tenderness or scarring.Marland Kitchen Lymphatic No adneopathy. No adenopathy. No  adenopathy. Musculoskeletal Adexa without tenderness or enlargement.. Digits and nails w/o clubbing, cyanosis, infection, petechiae, ischemia, or inflammatory conditions.Marland Kitchen Psychiatric Judgement and insight Intact.. No evidence of depression, anxiety, or agitation.. General Notes: the left leg is looking much better and has micro-ulcerations with minimal oozing Integumentary (Hair, Skin) No suspicious lesions. No crepitus or fluctuance. No peri-wound warmth or erythema. No masses.. Wound #2 status is Open. Original cause of wound was Gradually Appeared. The wound is located on the Left,Circumferential Lower Leg. The wound measures 8cm length x 16cm width x 0.1cm depth; 100.531cm^2 area and 10.053cm^3 volume. The wound is limited to skin breakdown. There is no tunneling or undermining noted. There is a large amount of serous drainage noted. The wound margin is flat and intact. There is medium (34-66%) pink granulation within the wound bed. There is a medium (34-66%) amount of necrotic tissue within the wound bed including Adherent Slough. The periwound skin appearance exhibited: Maceration. The periwound skin appearance did not exhibit: Callus, Crepitus, Excoriation, Induration, Rash, Scarring, Dry/Scaly, Atrophie Blanche, Cyanosis, Ecchymosis, Hemosiderin Staining, Mottled, Pallor, Rubor, Erythema. Periwound temperature was noted as No Abnormality. The periwound has tenderness on palpation. Wound #3 status is Open. Original cause of wound was Gradually Appeared. The wound is located on the Right Lower Leg. The wound measures 0.1cm length x 0.1cm width x 0.1cm depth; 0.008cm^2 area  and 0.001cm^3 volume. The wound is limited to skin breakdown. There is no tunneling or undermining noted. There is a large amount of serous drainage noted. The wound margin is flat and intact. There is medium (34-66%) red granulation within the wound bed. There is a medium (34-66%) amount of necrotic tissue within the wound bed. The periwound skin appearance did not exhibit: Callus, Crepitus, Excoriation, Induration, Rash, Scarring, Dry/Scaly, Maceration, Atrophie Blanche, Cyanosis, Ecchymosis, Hemosiderin Staining, Mottled, Pallor, Rubor, Erythema. Periwound temperature was noted as No Abnormality. The periwound has tenderness on palpation. 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 Cleanse wound with mild soap and water Wound #3 Right Lower Leg: Clean wound with Normal Saline. Cleanse wound with mild soap and water Primary Wound Dressing: Wound #2 Left,Circumferential Lower Leg: Aquacel Ag Wound #3 Right Lower Leg: Aquacel Ag Secondary Dressing: Wound #2 Left,Circumferential Lower Leg: ABD pad XtraSorb - HHRN PLEASE PROVIDE XTRASORB FOR PATIENT Wound #3 Right Lower Leg: ABD pad XtraSorb - HHRN PLEASE PROVIDE XTRASORB FOR PATIENT Dressing Change Frequency: Wound #2 Left,Circumferential Lower Leg: Three times weekly Wound #3 Right Lower Leg: Three times weekly Follow-up Appointments: Wound #2 Left,Circumferential Lower Leg: Return Appointment in 1 week. Wound #3 Right Lower Leg: Return Appointment in 1 week. Edema Control: Wound #2  Left,Circumferential Lower Leg: Kerlix and Coban - Bilateral - may anchor with unna paste Adam Merritt, Adam Merritt ([id number]) Wound #3 Right Lower Leg: Patient to wear own Juxtalite/Juzo compression garment. - HHRN to teach patient and spouse how to apply juxtalite once available Additional Orders / Instructions: Wound #2 Left,Circumferential Lower Leg: Increase protein intake. Other: - please work to keep blood sugars below 180 Wound #3 Right Lower Leg: Increase protein intake. Other: - please work to keep blood sugars below  [place]: Wound #2 Left,Circumferential Lower Leg: Continue Home Health Visits - [place] Nurse may visit PRN to address patient s wound care needs. FACE TO FACE ENCOUNTER: MEDICARE and MEDICAID PATIENTS: I certify that this patient is under my care and that I had a face-to-face encounter that meets the physician face-to-face encounter requirements with this patient on this date. The encounter with the patient was in whole or in part for the following MEDICAL CONDITION: (primary reason for [place]) MEDICAL NECESSITY: I certify, that based on my findings, NURSING services are a medically necessary home health service. HOME BOUND STATUS: I certify that my clinical findings support that this patient is homebound (i.e., Due to illness or injury, pt requires aid of supportive devices such as crutches, cane, wheelchairs, walkers, the use of special transportation or the assistance of another person to leave their place of residence. There is a normal inability to leave the home and doing so requires considerable and taxing effort. Other absences are for medical reasons / religious services and are infrequent or of short duration when for other reasons). If current dressing causes regression in wound condition, may D/C ordered dressing product/s and apply Normal Saline Moist Dressing daily until next [place] / Other MD appointment. [place] of regression in wound condition at [phone]. Please direct any NON-WOUND related issues/requests for orders to patient's Primary Care Physician Wound #3 Right Lower Leg: Miguel Barrera Visits - [place] Nurse may visit PRN to address patient s wound care needs. FACE TO FACE ENCOUNTER: MEDICARE and MEDICAID PATIENTS: I certify that this patient is under my care and that I had a face-to-face encounter that meets the physician face-to-face encounter requirements with this patient on this date. The encounter with the patient was in whole or in part for the following MEDICAL CONDITION: (primary reason for [place]) MEDICAL NECESSITY: I certify, that based on my findings, NURSING services are a medically necessary home health service. HOME BOUND STATUS: I certify that my clinical findings support that this patient is homebound (i.e., Due to illness or injury, pt requires aid of supportive devices such as crutches, cane, wheelchairs, walkers, the use of special transportation or the assistance of another person to leave their place of residence. There is a normal inability to leave the home and doing so requires considerable and taxing effort. Other absences are for medical reasons / religious services and are infrequent or of short duration when for other reasons). If current dressing causes regression in wound condition, may D/C ordered dressing product/s and apply Normal Saline Moist Dressing daily until next [place] / Other MD appointment. [place] of regression in wound condition at [phone]. Please direct any NON-WOUND related issues/requests for orders to patient's Primary Care Physician Adam Merritt, Adam Merritt. ([id number]) He will not be getting any compression stockings and hence we were left to elevate complete closure of his wounds before he gets to the lymphedema clinic. I have recommended: 1. Silver alginate  and a light Kerlix and Coban dressing to both lower extremities. 2. start his paperwork to refer him to the lymphedema clinic 3. Good control of his diabetes mellitus 4. Elevation and exercise. Electronic Signature(s) Signed: 11/13/2016 3:29:13 PM By: Christin Fudge MD, FACS Entered By: Christin Fudge on 11/13/2016 15:29:13 Adam Merritt ([id number]) -------------------------------------------------------------------------------- SuperBill Details Patient Name: Adam Merritt Date of Service: 11/13/2016 Medical Record Number: [id number] Patient Account Number: [id number] Date of Birth/Sex:  02/18/1946 (71 y.o. 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 Facility Procedures CPT4 Code: [id number] Description: 99214 - WOUND CARE VISIT-LEV 4 EST PT Modifier: Quantity: 1 Physician Procedures CPT4 Code Description: [id number] 19509 - WC PHYS LEVEL 3 - EST PT ICD-10 Description Diagnosis 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

## 2016-11-14 NOTE — Progress Notes (Signed)
Adam Merritt, Adam Merritt ([id number]) Visit Report for 11/13/2016 Arrival Information Details Patient Name: Adam Merritt Date of Service: 11/13/2016 3:00 PM Medical Record Number: [id number] Patient Account Number: [id number] Date of Birth/Sex: [birth year] (71 y.o. Male) Treating RN: Montey Hora Primary Care Ridley Dileo: [name] Other Clinician: Referring Hallie Ertl: [name] Treating Laurella Tull/Extender: Frann Rider in Treatment: 6 Visit Information History Since Last Visit Added or deleted any medications: No Patient Arrived: [name] Any new allergies or adverse reactions: No Arrival Time: 14:54 Had a fall or experienced change in No Accompanied By: spouse activities of daily living that may affect Transfer Assistance: None risk of falls: Patient Identification Verified: Yes Signs or symptoms of abuse/neglect since last No Secondary Verification Process Yes visito Completed: Hospitalized since last visit: No Patient Requires Transmission- No Has Dressing in Place as Prescribed: Yes Based Precautions: Has Compression in Place as Prescribed: Yes Patient Has Alerts: Yes Pain Present Now: No Patient Alerts: DMII ABI [place] BILATERAL >220 Electronic Signature(s) Signed: 11/13/2016 4:59:03 PM By: Montey Hora Entered By: Montey Hora on 11/13/2016 14:56:35 Adam Merritt ([id number]) -------------------------------------------------------------------------------- Clinic Level of Care Assessment Details Patient Name: Adam Merritt Date of Service: 11/13/2016 3:00 PM Medical Record Number: [id number] Patient Account Number: [id number] Date of Birth/Sex: 1946-06-04 (71 y.o. Male) Treating RN: Montey Hora Primary Care Beyza Bellino: [name] Other Clinician: Referring Rihan Schueler: [name] Treating Dakoda Bassette/Extender: Frann Rider in Treatment: 6 Clinic Level of Care Assessment Items TOOL 4 Quantity Score []  - Use when only an EandM is performed on FOLLOW-UP visit  0 ASSESSMENTS - Nursing Assessment / Reassessment X - Reassessment of Co-morbidities (includes updates in patient status) 1 10 X - Reassessment of Adherence to Treatment Plan 1 5 ASSESSMENTS - Wound and Skin Assessment / Reassessment []  - Simple Wound Assessment / Reassessment - one wound 0 X - Complex Wound Assessment / Reassessment - multiple wounds 2 5 []  - Dermatologic / Skin Assessment (not related to wound area) 0 ASSESSMENTS - Focused Assessment X - Circumferential Edema Measurements - multi extremities 2 5 []  - Nutritional Assessment / Counseling / Intervention 0 X - Lower Extremity Assessment (monofilament, tuning fork, pulses) 1 5 []  - Peripheral Arterial Disease Assessment (using hand held doppler) 0 ASSESSMENTS - Ostomy and/or Continence Assessment and Care []  - Incontinence Assessment and Management 0 []  - Ostomy Care Assessment and Management (repouching, etc.) 0 PROCESS - Coordination of Care X - Simple Patient / Family Education for ongoing care 1 15 []  - Complex (extensive) Patient / Family Education for ongoing care 0 []  - Staff obtains Programmer, systems, Records, Test Results / Process Orders 0 []  - Staff telephones HHA, Nursing Homes / Clarify orders / etc 0 []  - Routine Transfer to another Facility (non-emergent condition) 0 Adam Merritt, Adam Merritt ([id number]) []  - Routine Hospital Admission (non-emergent condition) 0 []  - New Admissions / Biomedical engineer / Ordering NPWT, Apligraf, etc. 0 []  - Emergency Hospital Admission (emergent condition) 0 X - Simple Discharge Coordination 1 10 []  - Complex (extensive) Discharge Coordination 0 PROCESS - Special Needs []  - Pediatric / Minor Patient Management 0 []  - Isolation Patient Management 0 []  - Hearing / Language / Visual special needs 0 []  - Assessment of Community assistance (transportation, D/C planning, etc.) 0 []  - Additional assistance / Altered mentation 0 []  - Support Surface(s) Assessment (bed, cushion, seat, etc.)  0 INTERVENTIONS - Wound Cleansing / Measurement []  - Simple Wound Cleansing - one wound 0 X - Complex Wound Cleansing - multiple wounds  2 5 X - Wound Imaging (photographs - any number of wounds) 1 5 []  - Wound Tracing (instead of photographs) 0 []  - Simple Wound Measurement - one wound 0 X - Complex Wound Measurement - multiple wounds 2 5 INTERVENTIONS - Wound Dressings []  - Small Wound Dressing one or multiple wounds 0 []  - Medium Wound Dressing one or multiple wounds 0 X - Large Wound Dressing one or multiple wounds 2 20 []  - Application of Medications - topical 0 []  - Application of Medications - injection 0 INTERVENTIONS - Miscellaneous []  - External ear exam 0 Adam Merritt, Adam Merritt ([id number]) []  - Specimen Collection (cultures, biopsies, blood, body fluids, etc.) 0 []  - Specimen(s) / Culture(s) sent or taken to Lab for analysis 0 []  - Patient Transfer (multiple staff / Civil Service fast streamer / Similar devices) 0 []  - Simple Staple / Suture removal (25 or less) 0 []  - Complex Staple / Suture removal (26 or more) 0 []  - Hypo / Hyperglycemic Management (close monitor of Blood Glucose) 0 []  - Ankle / Brachial Index (ABI) - do not check if billed separately 0 X - Vital Signs 1 5 Has the patient been seen at the hospital within the last three years: Yes Total Score: 135 Level Of Care: New/Established - Level 4 Electronic Signature(s) Signed: 11/13/2016 4:59:03 PM By: Montey Hora Entered By: Montey Hora on 11/13/2016 16:25:13 Adam Merritt ([id number]) -------------------------------------------------------------------------------- Encounter Discharge Information Details Patient Name: Adam Merritt Date of Service: 11/13/2016 3:00 PM Medical Record Number: [id number] Patient Account Number: [id number] Date of Birth/Sex: 09/05/1945 (71 y.o. Male) Treating RN: Montey Hora Primary Care Rontrell Moquin: [name] Other Clinician: Referring Weylin Plagge: [name] Treating Brenya Taulbee/Extender:  Frann Rider in Treatment: 6 Encounter Discharge Information Items Discharge Pain Level: 0 Discharge Condition: Stable Ambulatory Status: Cane Discharge Destination: Home Transportation: Private Auto Accompanied By: spouse Schedule Follow-up Appointment: Yes Medication Reconciliation completed No and provided to Patient/Care Preslee Regas: Provided on Clinical Summary of Care: 11/13/2016 Form Type Recipient Paper Patient JT Electronic Signature(s) Signed: 11/13/2016 3:40:39 PM By: Ruthine Dose Entered By: Ruthine Dose on 11/13/2016 15:40:39 Adam Merritt ([id number]) -------------------------------------------------------------------------------- Lower Extremity Assessment Details Patient Name: Adam Merritt Date of Service: 11/13/2016 3:00 PM Medical Record Number: [id number] Patient Account Number: [id number] Date of Birth/Sex: [birth year] (71 y.o. Male) Treating RN: Montey Hora Primary Care Aadin Gaut: [name] Other Clinician: Referring Cadince Hilscher: [name] Treating Anaelle Dunton/Extender: Frann Rider in Treatment: 6 Edema Assessment Assessed: [Left: No] [Right: No] E[Left: dema] [Right: :] Calf Left: Right: Point of Measurement: 34 cm From Medial Instep 61.5 cm 53.2 cm Ankle Left: Right: Point of Measurement: 12 cm From Medial Instep 38.4 cm 32 cm Vascular Assessment Pulses: Dorsalis Pedis Palpable: [Left:Yes] [Right:Yes] Posterior Tibial Extremity colors, hair growth, and conditions: Extremity Color: [Left:Hyperpigmented] [Right:Hyperpigmented] Hair Growth on Extremity: [Left:No] [Right:No] Temperature of Extremity: [Left:Warm] [Right:Warm] Capillary Refill: [Left:< 3 seconds] [Right:< 3 seconds] Electronic Signature(s) Signed: 11/13/2016 4:59:03 PM By: Montey Hora Entered By: Montey Hora on 11/13/2016 15:04:36 Adam Merritt ([id number]) -------------------------------------------------------------------------------- Multi Wound Chart  Details Patient Name: Adam Merritt Date of Service: 11/13/2016 3:00 PM Medical Record Number: [id number] Patient Account Number: [id number] Date of Birth/Sex: [birth year] (71 y.o. Male) Treating RN: Montey Hora Primary Care Cashe Gatt: [name] Other Clinician: Referring Maleiya Pergola: [name] Treating Jalen Daluz/Extender: Frann Rider in Treatment: 6 Vital Signs Height(in): 65 Pulse(bpm): 82 Weight(lbs): 336 Blood Pressure 143/68 (mmHg): Body Mass Index(BMI): 56 Temperature(F): 98.4 Respiratory Rate 20 (breaths/min): Photos: [2:No Photos] [3:No  Photos] [N/A:N/A] Wound Location: [2:Left Lower Leg - Circumfernential] [3:Right Lower Leg] [N/A:N/A] Wounding Event: [2:Gradually Appeared] [3:Gradually Appeared] [N/A:N/A] Primary Etiology: [2:Lymphedema] [3:Lymphedema] [N/A:N/A] Comorbid History: [2:Anemia, Lymphedema, Chronic Obstructive Pulmonary Disease (COPD), Sleep Apnea, Congestive Heart Failure, Coronary Artery Disease, Hypertension, Peripheral Venous Disease, Type II Diabetes, Osteoarthritis, Neuropathy, Received  Chemotherapy] [3:Anemia, Lymphedema, Chronic Obstructive Pulmonary Disease (COPD), Sleep Apnea, Congestive Heart Failure, Coronary Artery Disease, Hypertension, Peripheral Venous Disease, Type II Diabetes, Osteoarthritis, Neuropathy, Received  Chemotherapy] [N/A:N/A] Date Acquired: [2:07/10/2016] [3:07/10/2016] [N/A:N/A] Weeks of Treatment: [2:6] [3:6] [N/A:N/A] Wound Status: [2:Open] [3:Open] [N/A:N/A] Measurements L x W x D 8x16x0.1 [3:0.1x0.1x0.1] [N/A:N/A] (cm) Area (cm) : [2:100.531] [3:0.008] [N/A:N/A] Volume (cm) : [2:10.053] [3:0.001] [N/A:N/A] % Reduction in Area: [2:68.60%] [3:99.80%] [N/A:N/A] % Reduction in Volume: 68.60% [3:99.80%] [N/A:N/A] Classification: [2:Partial Thickness] [3:Partial Thickness] [N/A:N/A] HBO Classification: [2:Grade 1] [3:Grade 1] [N/A:N/A] Exudate Amount: [2:Large] [3:Large] [N/A:N/A] Exudate Type: [2:Serous]  [3:Serous] [N/A:N/A] Exudate Color: [2:amber] [3:amber] [N/A:N/A] Foul Odor After Yes No N/A Cleansing: Odor Anticipated Due to No N/A N/A Product Use: Wound Margin: Flat and Intact Flat and Intact N/A Granulation Amount: Medium (34-66%) Medium (34-66%) N/A Granulation Quality: Pink Red N/A Necrotic Amount: Medium (34-66%) Medium (34-66%) N/A Exposed Structures: Fascia: No Fascia: No N/A Fat Layer (Subcutaneous Fat Layer (Subcutaneous Tissue) Exposed: No Tissue) Exposed: No Tendon: No Tendon: No Muscle: No Muscle: No Joint: No Joint: No Bone: No Bone: No Limited to Skin Limited to Skin Breakdown Breakdown Epithelialization: Small (1-33%) None N/A Periwound Skin Texture: Excoriation: No Excoriation: No N/A Induration: No Induration: No Callus: No Callus: No Crepitus: No Crepitus: No Rash: No Rash: No Scarring: No Scarring: No Periwound Skin Maceration: Yes Maceration: No N/A Moisture: Dry/Scaly: No Dry/Scaly: No Periwound Skin Color: Atrophie Blanche: No Atrophie Blanche: No N/A Cyanosis: No Cyanosis: No Ecchymosis: No Ecchymosis: No Erythema: No Erythema: No Hemosiderin Staining: No Hemosiderin Staining: No Mottled: No Mottled: No Pallor: No Pallor: No Rubor: No Rubor: No Temperature: No Abnormality No Abnormality N/A Tenderness on Yes Yes N/A Palpation: Wound Preparation: Ulcer Cleansing: Other: Ulcer Cleansing: Other: N/A soap and water soap and water Topical Anesthetic Topical Anesthetic Applied: Other: lidocaine Applied: Other: lidocaine 4% 4% Treatment Notes Electronic Signature(s) Signed: 11/13/2016 3:27:07 PM By: Christin Fudge MD, FACS Entered By: Christin Fudge on 11/13/2016 15:27:07 Adam Merritt ([id number]) Adam Merritt, Adam Merritt ([id number]) -------------------------------------------------------------------------------- [place] Details Patient Name: Adam Merritt Date of Service: 11/13/2016 3:00 PM Medical Record  Number: [id number] Patient Account Number: [id number] Date of Birth/Sex: 09-19-1945 (71 y.o. Male) Treating RN: Montey Hora Primary Care Earlyn Sylvan: [name] Other Clinician: Referring Tandi Hanko: [name] Treating Jaden Batchelder/Extender: Frann Rider in Treatment: 6 Active Inactive ` Abuse / Safety / Falls / Self Care Management Nursing Diagnoses: Impaired physical mobility Potential for falls Goals: Patient will remain injury free Date Initiated: 09/28/2016 Target Resolution Date: 12/08/2016 Goal Status: Active Interventions: Assess fall risk on admission and as needed Notes: ` Orientation to the Wound Care Program Nursing Diagnoses: Knowledge deficit related to the wound healing center program Goals: Patient/caregiver will verbalize understanding of the [place] Date Initiated: 09/28/2016 Target Resolution Date: 12/08/2016 Goal Status: Active Interventions: Provide education on orientation to the wound center Notes: ` Wound/Skin Impairment Nursing Diagnoses: Impaired tissue integrity Adam Merritt, Adam Merritt ([id number]) Goals: Patient/caregiver will verbalize understanding of skin care regimen Date Initiated: 09/28/2016 Target Resolution Date: 12/08/2016 Goal Status: Active Ulcer/skin breakdown will have a volume reduction of 30% by week 4 Date Initiated: 09/28/2016 Target Resolution  Date: 12/08/2016 Goal Status: Active Ulcer/skin breakdown will have a volume reduction of 50% by week 8 Date Initiated: 09/28/2016 Target Resolution Date: 12/08/2016 Goal Status: Active Ulcer/skin breakdown will have a volume reduction of 80% by week 12 Date Initiated: 09/28/2016 Target Resolution Date: 12/08/2016 Goal Status: Active Ulcer/skin breakdown will heal within 14 weeks Date Initiated: 09/28/2016 Target Resolution Date: 12/09/2016 Goal Status: Active Interventions: Assess patient/caregiver ability to obtain necessary supplies Assess patient/caregiver ability to  perform ulcer/skin care regimen upon admission and as needed Assess ulceration(s) every visit Notes: Electronic Signature(s) Signed: 11/13/2016 4:59:03 PM By: Montey Hora Entered By: Montey Hora on 11/13/2016 15:19:39 Adam Merritt ([id number]) -------------------------------------------------------------------------------- Pain Assessment Details Patient Name: Adam Merritt Date of Service: 11/13/2016 3:00 PM Medical Record Number: [id number] Patient Account Number: [id number] Date of Birth/Sex: 1945-09-26 (71 y.o. Male) Treating RN: Montey Hora Primary Care Cameka Rae: [name] Other Clinician: Referring Detrich Rakestraw: [name] Treating Jenney Brester/Extender: Frann Rider in Treatment: 6 Active Problems Location of Pain Severity and Description of Pain Patient Has Paino No Site Locations Pain Management and Medication Current Pain Management: Notes Topical or injectable lidocaine is offered to patient for acute pain when surgical debridement is performed. If needed, Patient is instructed to use over the counter pain medication for the following 24-48 hours after debridement. Wound care MDs do not prescribed pain medications. Patient has chronic pain or uncontrolled pain. Patient has been instructed to make an appointment with their Primary Care Physician for pain management. Electronic Signature(s) Signed: 11/13/2016 4:59:03 PM By: Montey Hora Entered By: Montey Hora on 11/13/2016 14:56:42 Adam Merritt ([id number]) -------------------------------------------------------------------------------- Patient/Caregiver Education Details Patient Name: Adam Merritt Date of Service: 11/13/2016 3:00 PM Medical Record Number: [id number] Patient Account Number: [id number] Date of Birth/Gender: 03/26/1946 (71 y.o. Male) Treating RN: Montey Hora Primary Care Physician: [name] Other Clinician: Referring Physician: [name] Treating Physician/Extender:  Frann Rider in Treatment: 6 Education Assessment Education Provided To: Patient and Caregiver Education Topics Provided Venous: Handouts: Other: leg elevation and need to f/u with lymphedema clinic asap Methods: Explain/Verbal Responses: State content correctly Electronic Signature(s) Signed: 11/13/2016 4:59:03 PM By: Montey Hora Entered By: Montey Hora on 11/13/2016 15:22:16 Adam Merritt ([id number]) -------------------------------------------------------------------------------- Wound Assessment Details Patient Name: Adam Merritt Date of Service: 11/13/2016 3:00 PM Medical Record Number: [id number] Patient Account Number: [id number] Date of Birth/Sex: December 31, 1945 (72 y.o. Male) Treating RN: Montey Hora Primary Care Jaedan Huttner: [name] Other Clinician: Referring Danetra Glock: [name] Treating Mayford Alberg/Extender: Frann Rider in Treatment: 6 Wound Status Wound Number: 2 Primary Lymphedema Etiology: Wound Location: Left Lower Leg - Circumfernential Wound Open Status: Wounding Event: Gradually Appeared Comorbid Anemia, Lymphedema, Chronic Date Acquired: 07/10/2016 History: Obstructive Pulmonary Disease (COPD), Weeks Of Treatment: 6 Sleep Apnea, Congestive Heart Failure, Clustered Wound: No Coronary Artery Disease, Hypertension, Peripheral Venous Disease, Type II Diabetes, Osteoarthritis, Neuropathy, Received Chemotherapy Photos Photo Uploaded By: Montey Hora on 11/13/2016 16:38:58 Wound Measurements Length: (cm) 8 Width: (cm) 16 Depth: (cm) 0.1 Area: (cm) 100.531 Volume: (cm) 10.053 % Reduction in Area: 68.6% % Reduction in Volume: 68.6% Epithelialization: Small (1-33%) Tunneling: No Undermining: No Wound Description Classification: Partial Thickness Foul Odor After Diabetic Severity Adam Newport): Grade 1 Due to Product Wound Margin: Flat and Intact Slough/Fibrino Exudate Amount: Large Exudate Type: Serous Exudate Color:  amber Adam Merritt, Adam Merritt ([id number]) Cleansing: Yes Use: No Yes Wound Bed Granulation Amount: Medium (34-66%) Exposed Structure Granulation Quality: Pink Fascia Exposed: No Necrotic Amount: Medium (34-66%) Fat Layer (Subcutaneous Tissue) Exposed:  No Necrotic Quality: Adherent Slough Tendon Exposed: No Muscle Exposed: No Joint Exposed: No Bone Exposed: No Limited to Skin Breakdown Periwound Skin Texture Texture Color No Abnormalities Noted: No No Abnormalities Noted: No Callus: No Atrophie Blanche: No Crepitus: No Cyanosis: No Excoriation: No Ecchymosis: No Induration: No Erythema: No Rash: No Hemosiderin Staining: No Scarring: No Mottled: No Pallor: No Moisture Rubor: No No Abnormalities Noted: No Dry / Scaly: No Temperature / Pain Maceration: Yes Temperature: No Abnormality Tenderness on Palpation: Yes Wound Preparation Ulcer Cleansing: Other: soap and water, Topical Anesthetic Applied: Other: lidocaine 4%, Treatment Notes Wound #2 (Left, Circumferential Lower Leg) 1. Cleansed with: Clean wound with Normal Saline Cleanse wound with antibacterial soap and water 4. Dressing Applied: Aquacel Ag 5. Secondary Dressing Applied ABD Pad 7. Secured with Tape Other (specify in notes) Notes kerlix. Adora Fridge to Engineer, production) Signed: 11/13/2016 4:59:03 PM By: Leonides Cave ([id number]) Entered By: Montey Hora on 11/13/2016 15:20:08 Adam Merritt ([id number]) -------------------------------------------------------------------------------- Wound Assessment Details Patient Name: Adam Merritt Date of Service: 11/13/2016 3:00 PM Medical Record Number: [id number] Patient Account Number: [id number] Date of Birth/Sex: 1946/04/22 (71 y.o. Male) Treating RN: Montey Hora Primary Care Ginnie Marich: [name] Other Clinician: Referring Diezel Mazur: [name] Treating Aceyn Kathol/Extender: Frann Rider in Treatment: 6 Wound  Status Wound Number: 3 Primary Lymphedema Etiology: Wound Location: Right Lower Leg Wound Open Wounding Event: Gradually Appeared Status: Date Acquired: 07/10/2016 Comorbid Anemia, Lymphedema, Chronic Weeks Of Treatment: 6 History: Obstructive Pulmonary Disease (COPD), Clustered Wound: No Sleep Apnea, Congestive Heart Failure, Coronary Artery Disease, Hypertension, Peripheral Venous Disease, Type II Diabetes, Osteoarthritis, Neuropathy, Received Chemotherapy Photos Photo Uploaded By: Montey Hora on 11/13/2016 16:39:16 Wound Measurements Length: (cm) 0.1 Width: (cm) 0.1 Depth: (cm) 0.1 Area: (cm) 0.008 Volume: (cm) 0.001 % Reduction in Area: 99.8% % Reduction in Volume: 99.8% Epithelialization: None Tunneling: No Undermining: No Wound Description Classification: Partial Thickness Foul Odor After Diabetic Severity Adam Newport): Grade 1 Slough/Fibrino Wound Margin: Flat and Intact Exudate Amount: Large Exudate Type: Serous Exudate Color: amber Adam Merritt, Adam Merritt ([id number]) Cleansing: No No Wound Bed Granulation Amount: Medium (34-66%) Exposed Structure Granulation Quality: Red Fascia Exposed: No Necrotic Amount: Medium (34-66%) Fat Layer (Subcutaneous Tissue) Exposed: No Tendon Exposed: No Muscle Exposed: No Joint Exposed: No Bone Exposed: No Limited to Skin Breakdown Periwound Skin Texture Texture Color No Abnormalities Noted: No No Abnormalities Noted: No Callus: No Atrophie Blanche: No Crepitus: No Cyanosis: No Excoriation: No Ecchymosis: No Induration: No Erythema: No Rash: No Hemosiderin Staining: No Scarring: No Mottled: No Pallor: No Moisture Rubor: No No Abnormalities Noted: No Dry / Scaly: No Temperature / Pain Maceration: No Temperature: No Abnormality Tenderness on Palpation: Yes Wound Preparation Ulcer Cleansing: Other: soap and water, Topical Anesthetic Applied: Other: lidocaine 4%, Treatment Notes Wound #3 (Right Lower  Leg) 1. Cleansed with: Clean wound with Normal Saline Cleanse wound with antibacterial soap and water 4. Dressing Applied: Aquacel Ag 5. Secondary Dressing Applied ABD Pad 7. Secured with Tape Other (specify in notes) Notes kerlix. Adora Fridge to Engineer, production) Signed: 11/13/2016 4:59:03 PM By: Leonides Cave ([id number]) Entered By: Montey Hora on 11/13/2016 15:20:26 Adam Merritt ([id number]) -------------------------------------------------------------------------------- Vitals Details Patient Name: Adam Merritt Date of Service: 11/13/2016 3:00 PM Medical Record Number: [id number] Patient Account Number: [id number] Date of Birth/Sex: Oct 16, 1945 (71 y.o. Male) Treating RN: Montey Hora Primary Care Michalina Calbert: [name] Other Clinician: Referring Nakiya [name]: [name] Treating Ulis Kaps/Extender: Frann Rider in  Treatment: 6 Vital Signs Time Taken: 14:56 Temperature (F): 98.4 Height (in): 65 Pulse (bpm): 82 Weight (lbs): 336 Respiratory Rate (breaths/min): 20 Body Mass Index (BMI): 55.9 Blood Pressure (mmHg): 143/68 Reference Range: 80 - 120 mg / dl Electronic Signature(s) Signed: 11/13/2016 4:59:03 PM By: Montey Hora Entered By: Montey Hora on 11/13/2016 14:57:05

## 2016-11-20 ENCOUNTER — Encounter: Payer: Medicare Other | Admitting: Surgery

## 2016-11-20 DIAGNOSIS — L97221 Non-pressure chronic ulcer of left calf limited to breakdown of skin: Secondary | ICD-10-CM | POA: Diagnosis not present

## 2016-11-21 NOTE — Progress Notes (Signed)
Adam Merritt ([id number]) Visit Report for 11/20/2016 Chief Complaint Document Details Patient Name: Adam Merritt, Adam Merritt Date of Service: 11/20/2016 2:30 PM Medical Record Number: [id number] Patient Account Number: [id number] Date of Birth/Sex: 05-Mar-1946 (71 y.o. Male) Treating RN: Montey Hora Primary Care Provider: [name] Other Clinician: Referring Provider: [name] Treating Provider/Extender: Frann Rider in Treatment: 7 Information Obtained from: Patient Chief Complaint the patient comes along with his wife for excessive swelling of both lower extremities left more than right and weeping of fluid for over 2 months now Electronic Signature(s) Signed: 11/20/2016 3:03:29 PM By: Christin Fudge MD, FACS Entered By: Christin Fudge on 11/20/2016 15:03:28 Adam Merritt ([id number]) -------------------------------------------------------------------------------- HPI Details Patient Name: Adam Merritt Date of Service: 11/20/2016 2:30 PM Medical Record Number: [id number] Patient Account Number: [id number] Date of Birth/Sex: [birth year] (71 y.o. Male) Treating RN: Montey Hora Primary Care Provider: [name] Other Clinician: Referring Provider: [name] Treating Provider/Extender: Frann Rider in Treatment: 7 History of Present Illness Location: bilateral lower leg edema with some blisters recently and weeping of fluid left side more than right Quality: Patient reports experiencing a dull pain to affected area(s). Severity: Patient states wound are getting worse. Duration: Patient has had the wound for > 3 months prior to seeking treatment at the wound center Timing: Pain in wound is Intermittent (comes and goes Context: The wound appeared gradually over time Modifying Factors: Consults to this date include: he has been seen by his PCP and she sent him off to see [place]. Associated Signs and Symptoms: Patient reports having difficulty standing for long  periods. HPI Description: 71 year old gentleman seen earlier at our clinic in June 2016 and again in June 2017 is noncompliant with recommendations and does not follow-up regularly. 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 He had referred him to the lymphedema clinic and he was accepted but he did not follow-up with them. At that stage he had agreed to get a repeat arterial study done and he has seen Dr. Trula Slade in [place]. This time around he has been seen by his PCP Dr. Tiburcio Bash who referred him urgently to [place] for bilateral lower extremity cellulitis in spite of being on antibiotics -- he was placed on Keflex and 500 mg 3 times a day for 10 days. His comorbidities include coronary artery disease, diabetes mellitus, hypertension, Bell's palsy and chronic lymphedema. he was also recently admitted to the hospital on April 13 and kept overnight with the diagnosis of a TIA and a left ICA stenosis to follow-up with vascular surgery as an outpatient 10/05/2016 -- patient has been compliant this week and left his compression and dressing on for the entire week and had home health change it appropriately. 11/13/2016 -- his insurance will not cover compression stockings for lymphedema and hence we will have to send him to the lymphedema clinic as soon as we have finished with his healing. 11/20/2016 -- his lymphedema clinic appointment is still pending but other than that he has done very well and we are ready to transition him over. ==== Old Notes: 71 year old gentleman who comes with bilateral lower limb edema for over 20 years and recent attack of Adam Merritt. ([id number]) blisters and redness for about 2 weeks. The patient says he  has had bilateral lower limb edema since 1996. He  has been noncompliant with wearing wraps in the past and even was given a boot to wear at night which she never tolerated and didn't wear it. He has a past medical history of sleep apnea, neuropathy with diabetes, hypertension, hyperlipidemia, coronary artery disease, varicose veins, carotid artery disease and is status post CABG and cholecystectomy. He was seen by the vascular surgeon on 09/29/2013 and Dr. Trula Slade opinion was:"I have reviewed his outside ultrasound which shows an ankle-brachial index of 0.72 on the left and 1.0 on the right.o This study was repeated at our office.o The ankle-brachial index on the right was 1.26 with triphasic waveforms.o On the left it was a 1.04 with biphasic waveforms.o The patient had a toe pressure of 121 on the right and 100 on the left. In summary, I would not recommend any further imaging , as I do not think his symptoms are related to arterial insufficiency, but rather secondary to diabetic neuropathy.o The patient still has room for increasing his Neurontin dose, which he states does help him.o I have scheduled him to come back in one year for a repeat arterial evaluation." in June 2016, a year ago, he has a venous duplex study to be done. He was also going to be accepted to the lymphedema clinic but I did not believe the patient went regularly for this. ==== Electronic Signature(s) Signed: 11/20/2016 3:04:18 PM By: Christin Fudge MD, FACS Entered By: Christin Fudge on 11/20/2016 15:04:17 Adam Merritt ([id number]) -------------------------------------------------------------------------------- Physical Exam Details Patient Name: Adam Merritt Date of Service: 11/20/2016 2:30 PM Medical Record Number: [id number] Patient Account Number: [id number] Date of Birth/Sex: 12-09-1945 (71 y.o. Male) Treating RN: Montey Hora Primary Care Provider: [name] Other Clinician: Referring Provider: [name] Treating Provider/Extender: Frann Rider in Treatment: 7 Constitutional . Pulse regular. Respirations normal and unlabored. Afebrile. . Eyes Nonicteric. Reactive to light. Ears, Nose, Mouth, and Throat Lips, teeth, and gums WNL.Marland Kitchen Moist mucosa without lesions. Neck supple and nontender. No palpable supraclavicular or cervical adenopathy. Normal sized without goiter. Respiratory WNL. No retractions.. Breath sounds WNL, No rubs, rales, rhonchi, or wheeze.. Cardiovascular Heart rhythm and rate regular, no murmur or gallop.. Pedal Pulses WNL. No clubbing, cyanosis or edema. Chest Breasts symmetical and no nipple discharge.. Breast tissue WNL, no masses, lumps, or tenderness.. Lymphatic No adneopathy. No adenopathy. No adenopathy. Musculoskeletal Adexa without tenderness or enlargement.. Digits and nails w/o clubbing, cyanosis, infection, petechiae, ischemia, or inflammatory conditions.. Integumentary (Hair, Skin) No suspicious lesions. No crepitus or fluctuance. No peri-wound warmth or erythema. No masses.Marland Kitchen Psychiatric Judgement and insight Intact.. No evidence of depression, anxiety, or agitation.. Notes the lymphedema on the right leg is much less than on the left but both of the wounds have healed nicely and there are no open ulcerations. Electronic Signature(s) Signed: 11/20/2016 3:04:47 PM By: Christin Fudge MD, FACS Entered By: Christin Fudge on 11/20/2016 15:04:46 Adam Merritt ([id number]) -------------------------------------------------------------------------------- Physician Orders Details Patient Name: Adam Merritt Date of Service: 11/20/2016 2:30 PM Medical Record Number: [id number] Patient Account Number: [id number] Date of Birth/Sex: [birth year] (71 y.o. Male) Treating RN: Montey Hora Primary Care Provider: [name] Other Clinician: Referring Provider: [name] Treating Provider/Extender: Frann Rider in Treatment: 7 Verbal / Phone Orders: No Diagnosis Coding Wound  Cleansing Wound #2 Left,Circumferential Lower Leg o Clean wound with Normal Saline. o Cleanse wound with mild soap and water Wound #3 Right Lower Leg o Clean wound  with Normal Saline. o Cleanse wound with mild soap and water Primary Wound Dressing Wound #2 Left,Circumferential Lower Leg o Aquacel Ag Wound #3 Right Lower Leg o Aquacel Ag Secondary Dressing Wound #2 Left,Circumferential Lower Leg o ABD pad Wound #3 Right Lower Leg o ABD pad Dressing Change Frequency Wound #2 Left,Circumferential Lower Leg o Three times weekly Wound #3 Right Lower Leg o Three times weekly Follow-up Appointments Wound #2 Left,Circumferential Lower Leg o Return Appointment in 1 week. Wound #3 Right Lower Leg o Return Appointment in 1 week. Adam Merritt, Adam Merritt ([id number]) Edema Control Wound #2 Left,Circumferential Lower Leg o Kerlix and Coban - Bilateral - may anchor with unna paste Wound #3 Right Lower Leg o Patient to wear own Juxtalite/Juzo compression garment. - HHRN to teach patient and spouse how to apply juxtalite once available Additional Orders / Instructions Wound #2 Left,Circumferential Lower Leg o Increase protein intake. o Other: - please work to keep blood sugars below 180 Wound #3 Right Lower Leg o Increase protein intake. o Other: - please work to keep blood sugars below [place] #2 Left,Circumferential Lower Leg o [place] Visits - [place] Nurse may visit PRN to address patientos wound care needs. o FACE TO FACE ENCOUNTER: MEDICARE and MEDICAID PATIENTS: I certify that this patient is under my care and that I had a face-to-face encounter that meets the physician face-to-face encounter requirements with this patient on this date. The encounter with the patient was in whole or in part for the following MEDICAL CONDITION: (primary reason for [place]) MEDICAL NECESSITY: I certify, that based  on my findings, NURSING services are a medically necessary home health service. HOME BOUND STATUS: I certify that my clinical findings support that this patient is homebound (i.e., Due to illness or injury, pt requires aid of supportive devices such as crutches, cane, wheelchairs, walkers, the use of special transportation or the assistance of another person to leave their place of residence. There is a normal inability to leave the home and doing so requires considerable and taxing effort. Other absences are for medical reasons / religious services and are infrequent or of short duration when for other reasons). o If current dressing causes regression in wound condition, may D/C ordered dressing product/s and apply Normal Saline Moist Dressing Merritt until next [place] / Other MD appointment. [place] of regression in wound condition at [phone]. o Please direct any NON-WOUND related issues/requests for orders to patient's Primary Care Physician Wound #3 Right Lower Leg o [place] Visits - [place] Nurse may visit PRN to address patientos wound care needs. o FACE TO FACE ENCOUNTER: MEDICARE and MEDICAID PATIENTS: I certify that this patient is under my care and that I had a face-to-face encounter that meets the physician face-to-face encounter requirements with this patient on this date. The encounter with the patient was in whole or in part for the following MEDICAL CONDITION: (primary reason for [place]) MEDICAL NECESSITY: I certify, that based on my findings, NURSING services are a medically Adam Merritt, Adam Merritt ([id number]) necessary home health service. HOME BOUND STATUS: I certify that my clinical findings support that this patient is homebound (i.e., Due to illness or injury, pt requires aid of supportive devices such as crutches, cane, wheelchairs, walkers, the use of special transportation or the assistance of  another person to leave their place of residence. There is a normal inability to leave the home and  doing so requires considerable and taxing effort. Other absences are for medical reasons / religious services and are infrequent or of short duration when for other reasons). o If current dressing causes regression in wound condition, may D/C ordered dressing product/s and apply Normal Saline Moist Dressing Merritt until next [place] / Other MD appointment. [place] of regression in wound condition at [phone]. o Please direct any NON-WOUND related issues/requests for orders to patient's Primary Care Physician Electronic Signature(s) Signed: 11/20/2016 3:10:48 PM By: Christin Fudge MD, FACS Signed: 11/20/2016 4:28:06 PM By: Montey Hora Entered By: Montey Hora on 11/20/2016 14:45:17 Adam Merritt ([id number]) -------------------------------------------------------------------------------- Problem List Details Patient Name: Adam Merritt Date of Service: 11/20/2016 2:30 PM Medical Record Number: [id number] Patient Account Number: [id number] Date of Birth/Sex: [birth year] (71 y.o. 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 Male) Treating RN: Montey Hora Primary Care Provider: [name] Other Clinician: Referring Provider: [name] Treating Provider/Extender: Frann Rider in Treatment: 7 Subjective Chief Complaint Information obtained from Patient the patient comes along with his wife for excessive swelling of both lower extremities left more than right and weeping of fluid for over 2 months now History of Present Illness (HPI) The following HPI elements were documented for the patient's wound: Location: bilateral lower leg edema with some blisters recently and weeping of fluid left side more than right Quality: Patient reports experiencing a dull pain to affected area(s). Severity: Patient states wound are getting worse. Duration: Patient has had the wound for > 3 months prior to seeking treatment at the wound center Timing: Pain in wound is Intermittent (comes and goes Context: The wound appeared gradually over time Modifying Factors: Consults to this date include: he has been seen by his PCP and she sent him off to see [place]. Associated Signs and Symptoms: Patient reports having difficulty standing for long periods. 71 year old gentleman seen earlier at our clinic in June 2016 and again in June 2017 is noncompliant with recommendations and does not follow-up regularly. 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 He had referred him to the lymphedema clinic and  he was accepted but he did not follow-up with them. At that stage he had agreed to get a repeat arterial study done and he has seen Dr. Trula Slade in [place]. This time around he has been seen by his PCP Dr. Tiburcio Bash who referred him urgently to [place] for bilateral lower extremity cellulitis in spite of being on  antibiotics -- he was placed on Keflex and 500 mg 3 times a day for 10 days. His comorbidities include coronary artery disease, diabetes mellitus, hypertension, Bell's palsy and chronic lymphedema. he was also recently admitted to the hospital on April 13 and kept overnight with the diagnosis of a TIA and a left ICA stenosis to follow-up with vascular surgery as an outpatient 10/05/2016 -- patient has been compliant this week and left his compression and dressing on for the entire week and had home health change it appropriately. 11/13/2016 -- his insurance will not cover compression stockings for lymphedema and hence we will have to send him to the lymphedema clinic as soon as we have finished with his healing. 11/20/2016 -- his lymphedema clinic appointment is still pending but other than that he has done very well Adam Merritt, Adam Merritt. ([id number]) and we are ready to transition him over. ==== Old Notes: 71 year old gentleman who comes with bilateral lower limb edema for over 20 years and recent attack of blisters and redness for about 2 weeks. The patient says he has had bilateral lower limb edema since 1996. He has been noncompliant with wearing wraps in the past and even was given a boot to wear at night which she never tolerated and didn't wear it. He has a past medical history of sleep apnea, neuropathy with diabetes, hypertension, hyperlipidemia, coronary artery disease, varicose veins, carotid artery disease and is status post CABG and cholecystectomy. He was seen by the vascular surgeon on 09/29/2013 and Dr. Trula Slade opinion was:"I have reviewed his outside ultrasound which shows  an ankle-brachial index of 0.72 on the left and 1.0 on the right. This study was repeated at our office. The ankle-brachial index on the right was 1.26 with triphasic waveforms. On the left it was a 1.04 with biphasic waveforms. The patient had a toe pressure of 121 on the right and 100 on the left. In summary, I would not recommend any further imaging , as I do not think his symptoms are related to arterial insufficiency, but rather secondary to diabetic neuropathy. The patient still has room for increasing his Neurontin dose, which he states does help him. I have scheduled him to come back in one year for a repeat arterial evaluation." in June 2016, a year ago, he has a venous duplex study to be done. He was also going to be accepted to the lymphedema clinic but I did not believe the patient went regularly for this. ==== Objective Constitutional Pulse regular. Respirations normal and unlabored. Afebrile. Vitals Time Taken: 2:23 PM, Height: 65 in, Weight: 336 lbs, BMI: 55.9, Temperature: 98.2 F, Pulse: 80 bpm, Respiratory Rate: 20 breaths/min, Blood Pressure: 132/71 mmHg. Eyes Nonicteric. Reactive to light. Ears, Nose, Mouth, and Throat Lips, teeth, and gums WNL.Marland Kitchen Moist mucosa without lesions. Neck supple and nontender. No palpable supraclavicular or cervical adenopathy. Normal sized without goiter. Adam Merritt, Adam Merritt ([id number]) Respiratory WNL. No retractions.. Breath sounds WNL, No rubs, rales, rhonchi, or wheeze.. Cardiovascular Heart rhythm and rate regular, no murmur or gallop.. Pedal Pulses WNL. No clubbing, cyanosis or edema. Chest Breasts symmetical and no nipple discharge.. Breast tissue WNL, no masses, lumps, or tenderness.. Lymphatic No adneopathy. No adenopathy. No adenopathy. Musculoskeletal Adexa without tenderness or enlargement.. Digits and nails w/o clubbing, cyanosis, infection, petechiae, ischemia, or inflammatory conditions.Marland Kitchen Psychiatric Judgement and insight  Intact.. No evidence of depression, anxiety, or agitation.. General Notes: the lymphedema on the right leg is much less than on the left but both of the  wounds have healed nicely and there are no open ulcerations. Integumentary (Hair, Skin) No suspicious lesions. No crepitus or fluctuance. No peri-wound warmth or erythema. No masses.. Wound #2 status is Open. Original cause of wound was Gradually Appeared. The wound is located on the Left,Circumferential Lower Leg. The wound measures 3cm length x 5cm width x 0.1cm depth; 11.781cm^2 area and 1.178cm^3 volume. The wound is limited to skin breakdown. There is no tunneling or undermining noted. There is a large amount of serous drainage noted. The wound margin is flat and intact. There is large (67-100%) pink granulation within the wound bed. There is no necrotic tissue within the wound bed. The periwound skin appearance exhibited: Maceration. The periwound skin appearance did not exhibit: Callus, Crepitus, Excoriation, Induration, Rash, Scarring, Dry/Scaly, Atrophie [place], Cyanosis, Ecchymosis, Hemosiderin Staining, Mottled, Pallor, Rubor, Erythema. Periwound temperature was noted as No Abnormality. The periwound has tenderness on palpation. Wound #3 status is Open. Original cause of wound was Gradually Appeared. The wound is located on the Right Lower Leg. The wound measures 0.1cm length x 0.1cm width x 0.1cm depth; 0.008cm^2 area and 0.001cm^3 volume. The wound is limited to skin breakdown. There is no tunneling or undermining noted. There is a large amount of serous drainage noted. The wound margin is flat and intact. There is large (67- 100%) red granulation within the wound bed. There is no necrotic tissue within the wound bed. The periwound skin appearance did not exhibit: Callus, Crepitus, Excoriation, Induration, Rash, Scarring, Dry/Scaly, Maceration, Atrophie Blanche, Cyanosis, Ecchymosis, Hemosiderin Staining, Mottled, Pallor, Rubor,  Erythema. Periwound temperature was noted as No Abnormality. The periwound has tenderness on palpation. 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 Cleanse wound with mild soap and water Wound #3 Right Lower Leg: Clean wound with Normal Saline. Cleanse wound with mild soap and water Primary Wound Dressing: Wound #2 Left,Circumferential Lower Leg: Aquacel Ag Wound #3 Right Lower Leg: Aquacel Ag Secondary Dressing: Wound #2 Left,Circumferential Lower Leg: ABD pad Wound #3 Right Lower Leg: ABD pad Dressing Change Frequency: Wound #2 Left,Circumferential Lower Leg: Three times weekly Wound #3 Right Lower Leg: Three times weekly Follow-up Appointments: Wound #2 Left,Circumferential Lower Leg: Return Appointment in 1 week. Wound #3 Right Lower Leg: Return Appointment in 1 week. Edema Control: Wound #2 Left,Circumferential Lower Leg: Kerlix and Coban - Bilateral - may anchor with unna paste Wound #3 Right Lower Leg: Patient to wear own Juxtalite/Juzo compression garment. - HHRN to teach patient and spouse how to Adam Merritt, Adam Merritt. ([id number]) apply juxtalite once available Additional Orders / Instructions: Wound #2 Left,Circumferential Lower Leg: Increase protein intake. Other: - please work to keep blood sugars below 180 Wound #3 Right Lower Leg: Increase protein intake. Other: - please work to keep blood sugars below [place]: Wound #2 Left,Circumferential Lower Leg: Continue Home Health Visits - [place] Nurse may visit PRN  to address patient s wound care needs. FACE TO FACE ENCOUNTER: MEDICARE and MEDICAID PATIENTS: I certify that this patient is under my care and that I had a face-to-face encounter that meets the physician face-to-face encounter requirements with this patient on  this date. The encounter with the patient was in whole or in part for the following MEDICAL CONDITION: (primary reason for [place]) MEDICAL NECESSITY: I certify, that based on my findings, NURSING services are a medically necessary home health service. HOME BOUND STATUS: I certify that my clinical findings support that this patient is homebound (i.e., Due to illness or injury, pt requires aid of supportive devices such as crutches, cane, wheelchairs, walkers, the use of special transportation or the assistance of another person to leave their place of residence. There is a normal inability to leave the home and doing so requires considerable and taxing effort. Other absences are for medical reasons / religious services and are infrequent or of short duration when for other reasons). If current dressing causes regression in wound condition, may D/C ordered dressing product/s and apply Normal Saline Moist Dressing Merritt until next [place] / Other MD appointment. [place] of regression in wound condition at [phone]. Please direct any NON-WOUND related issues/requests for orders to patient's Primary Care Physician Wound #3 Right Lower Leg: [place] Visits - [place] Nurse may visit PRN to address patient s wound care needs. FACE TO FACE ENCOUNTER: MEDICARE and MEDICAID PATIENTS: I certify that this patient is under my care and that I had a face-to-face encounter that meets the physician face-to-face encounter requirements with this patient on this date. The encounter with the patient was in whole or in part for the following MEDICAL CONDITION: (primary reason for [place]) MEDICAL NECESSITY: I certify, that based on my findings, NURSING services are a medically necessary home health service. HOME BOUND STATUS: I certify that my clinical findings support that this patient is homebound (i.e., Due to illness or injury, pt requires aid of supportive devices such as crutches, cane, wheelchairs, walkers, the use of special transportation or the assistance of another person to leave their place of residence. There is a normal inability to leave the home and doing so requires considerable and taxing effort. Other absences are for medical reasons / religious services and are infrequent or of short duration when for other reasons). If current dressing causes regression in wound condition, may D/C ordered dressing product/s and apply Normal Saline Moist Dressing [place] until next [place] / Other MD appointment. [place] of regression in wound condition at [phone]. Please direct any NON-WOUND related issues/requests for orders to patient's Primary Care Physician Adam Merritt, Adam Merritt. ([id number]) He has complete closure of his wounds and we would like to get him to the lymphedema clinic. I have recommended: 1. Silver alginate and a light Kerlix and Coban dressing to both lower extremities. 2. refer him to the lymphedema clinic 3. Good control of his diabetes mellitus 4. Elevation and exercise. Electronic Signature(s) Signed: 11/20/2016 3:06:39 PM By: Christin Fudge MD, FACS Entered By: Christin Fudge on 11/20/2016 15:06:38 Adam Merritt ([id number]) -------------------------------------------------------------------------------- SuperBill Details Patient Name: Adam Merritt Date of Service: 11/20/2016 Medical Record Number: [id number] Patient Account Number: [id number] Date of Birth/Sex: 10-13-1945 (71 y.o. 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 Facility Procedures CPT4 Code: [id number] Description: 43154 - WOUND CARE VISIT-LEV 4 EST PT Modifier: Quantity: 1 Physician Procedures CPT4 Code Description: [id number] - WC PHYS LEVEL 3 - EST PT ICD-10 Description Diagnosis 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

## 2016-11-21 NOTE — Progress Notes (Signed)
GEORDAN, XU ([id number]) Visit Report for 11/20/2016 Arrival Information Details Patient Name: Adam Merritt, Adam Merritt Date of Service: 11/20/2016 2:30 PM Medical Record Number: [id number] Patient Account Number: [id number] Date of Birth/Sex: Apr 03, 1946 (71 y.o. Male) Treating RN: Adam Merritt Hora Primary Care Latrina Guttman: [name] Other Clinician: Referring Mckaylee Dimalanta: [name] Treating Nels Munn/Extender: Frann Rider in Treatment: 7 Visit Information History Since Last Visit Added or deleted any medications: No Patient Arrived: Adam Merritt Any new allergies or adverse reactions: No Arrival Time: 14:20 Had a fall or experienced change in No Accompanied By: self activities of daily living that may affect Transfer Assistance: None risk of falls: Patient Identification Verified: Yes Signs or symptoms of abuse/neglect since last No Secondary Verification Process Yes visito Completed: Hospitalized since last visit: No Patient Requires Transmission- No Has Dressing in Place as Prescribed: Yes Based Precautions: Has Compression in Place as Prescribed: Yes Patient Has Alerts: Yes Pain Present Now: No Patient Alerts: DMII ABI Donald BILATERAL >220 Electronic Signature(s) Signed: 11/20/2016 4:28:06 PM By: Adam Merritt Hora Entered By: Adam Merritt Hora on 11/20/2016 14:23:29 Adam Merritt ([id number]) -------------------------------------------------------------------------------- Clinic Level of Care Assessment Details Patient Name: Adam Merritt Date of Service: 11/20/2016 2:30 PM Medical Record Number: [id number] Patient Account Number: [id number] Date of Birth/Sex: 1946-06-01 (71 y.o. Male) Treating RN: Adam Merritt Hora Primary Care Zaheer Wageman: [name] Other Clinician: Referring Tamura Lasky: [name] Treating Bekim Werntz/Extender: Frann Rider in Treatment: 7 Clinic Level of Care Assessment Items TOOL 4 Quantity Score []  - Use when only an EandM is performed on FOLLOW-UP visit  0 ASSESSMENTS - Nursing Assessment / Reassessment X - Reassessment of Co-morbidities (includes updates in patient status) 1 10 X - Reassessment of Adherence to Treatment Plan 1 5 ASSESSMENTS - Wound and Skin Assessment / Reassessment []  - Simple Wound Assessment / Reassessment - one wound 0 X - Complex Wound Assessment / Reassessment - multiple wounds 2 5 []  - Dermatologic / Skin Assessment (not related to wound area) 0 ASSESSMENTS - Focused Assessment X - Circumferential Edema Measurements - multi extremities 2 5 []  - Nutritional Assessment / Counseling / Intervention 0 X - Lower Extremity Assessment (monofilament, tuning fork, pulses) 1 5 []  - Peripheral Arterial Disease Assessment (using hand held doppler) 0 ASSESSMENTS - Ostomy and/or Continence Assessment and Care []  - Incontinence Assessment and Management 0 []  - Ostomy Care Assessment and Management (repouching, etc.) 0 PROCESS - Coordination of Care X - Simple Patient / Family Education for ongoing care 1 15 []  - Complex (extensive) Patient / Family Education for ongoing care 0 []  - Staff obtains Programmer, systems, Records, Test Results / Process Orders 0 []  - Staff telephones HHA, Nursing Homes / Clarify orders / etc 0 []  - Routine Transfer to another Facility (non-emergent condition) 0 Adam Merritt, Adam Merritt ([id number]) []  - Routine Hospital Admission (non-emergent condition) 0 []  - New Admissions / Biomedical engineer / Ordering NPWT, Apligraf, etc. 0 []  - Emergency Hospital Admission (emergent condition) 0 X - Simple Discharge Coordination 1 10 []  - Complex (extensive) Discharge Coordination 0 PROCESS - Special Needs []  - Pediatric / Minor Patient Management 0 []  - Isolation Patient Management 0 []  - Hearing / Language / Visual special needs 0 []  - Assessment of Community assistance (transportation, D/C planning, etc.) 0 []  - Additional assistance / Altered mentation 0 []  - Support Surface(s) Assessment (bed, cushion, seat, etc.)  0 INTERVENTIONS - Wound Cleansing / Measurement []  - Simple Wound Cleansing - one wound 0 X - Complex Wound Cleansing - multiple wounds  2 5 X - Wound Imaging (photographs - any number of wounds) 1 5 []  - Wound Tracing (instead of photographs) 0 []  - Simple Wound Measurement - one wound 0 X - Complex Wound Measurement - multiple wounds 2 5 INTERVENTIONS - Wound Dressings []  - Small Wound Dressing one or multiple wounds 0 X - Medium Wound Dressing one or multiple wounds 2 15 []  - Large Wound Dressing one or multiple wounds 0 []  - Application of Medications - topical 0 []  - Application of Medications - injection 0 INTERVENTIONS - Miscellaneous []  - External ear exam 0 Adam Merritt, Adam Merritt ([id number]) []  - Specimen Collection (cultures, biopsies, blood, body fluids, etc.) 0 []  - Specimen(s) / Culture(s) sent or taken to Lab for analysis 0 []  - Patient Transfer (multiple staff / Civil Service fast streamer / Similar devices) 0 []  - Simple Staple / Suture removal (25 or less) 0 []  - Complex Staple / Suture removal (26 or more) 0 []  - Hypo / Hyperglycemic Management (close monitor of Blood Glucose) 0 []  - Ankle / Brachial Index (ABI) - do not check if billed separately 0 X - Vital Signs 1 5 Has the patient been seen at the hospital within the last three years: Yes Total Score: 125 Level Of Care: New/Established - Level 4 Electronic Signature(s) Signed: 11/20/2016 4:28:06 PM By: Adam Merritt Hora Entered By: Adam Merritt Hora on 11/20/2016 15:26:53 Adam Merritt ([id number]) -------------------------------------------------------------------------------- Encounter Discharge Information Details Patient Name: Adam Merritt Date of Service: 11/20/2016 2:30 PM Medical Record Number: [id number] Patient Account Number: [id number] Date of Birth/Sex: 1945/12/09 (71 y.o. Male) Treating RN: Adam Merritt Hora Primary Care Carolos Fecher: [name] Other Clinician: Referring Yasmeen Manka: [name] Treating Deborahann Poteat/Extender:  Frann Rider in Treatment: 7 Encounter Discharge Information Items Discharge Pain Level: 0 Discharge Condition: Stable Ambulatory Status: Cane Discharge Destination: Home Transportation: Private Auto Accompanied By: spouse Schedule Follow-up Appointment: Yes Medication Reconciliation completed No and provided to Patient/Care Fouad Taul: Provided on Clinical Summary of Care: 11/20/2016 Form Type Recipient Paper Patient JT Electronic Signature(s) Signed: 11/20/2016 3:01:24 PM By: Adam Merritt Entered By: Adam Merritt on 11/20/2016 15:01:24 Adam Merritt ([id number]) -------------------------------------------------------------------------------- Lower Extremity Assessment Details Patient Name: Adam Merritt Date of Service: 11/20/2016 2:30 PM Medical Record Number: [id number] Patient Account Number: [id number] Date of Birth/Sex: [birth year] (71 y.o. Male) Treating RN: Adam Merritt Hora Primary Care Gertude Benito: [name] Other Clinician: Referring Karla Vines: [name] Treating Exavier Lina/Extender: Frann Rider in Treatment: 7 Edema Assessment Assessed: [Left: No] [Right: No] Edema: [Left: Yes] [Right: Yes] Calf Left: Right: Point of Measurement: 34 cm From Medial Instep 60.2 cm 52.3 cm Ankle Left: Right: Point of Measurement: 12 cm From Medial Instep 38.4 cm 31.1 cm Vascular Assessment Pulses: Dorsalis Pedis Palpable: [Left:Yes] [Right:Yes] Posterior Tibial Extremity colors, hair growth, and conditions: Extremity Color: [Left:Hyperpigmented] [Right:Hyperpigmented] Hair Growth on Extremity: [Left:No] [Right:No] Temperature of Extremity: [Left:Warm] [Right:Warm] Capillary Refill: [Left:< 3 seconds] [Right:< 3 seconds] Electronic Signature(s) Signed: 11/20/2016 4:28:06 PM By: Adam Merritt Hora Entered By: Adam Merritt Hora on 11/20/2016 14:31:24 Adam Merritt ([id number]) -------------------------------------------------------------------------------- Multi Wound  Chart Details Patient Name: Adam Merritt Date of Service: 11/20/2016 2:30 PM Medical Record Number: [id number] Patient Account Number: [id number] Date of Birth/Sex: 24-Jan-1946 (71 y.o. Male) Treating RN: Adam Merritt Hora Primary Care Natalyia Innes: [name] Other Clinician: Referring Kyara Boxer: [name] Treating Senon Nixon/Extender: Frann Rider in Treatment: 7 Vital Signs Height(in): 65 Pulse(bpm): 80 Weight(lbs): 336 Blood Pressure 132/71 (mmHg): Body Mass Index(BMI): 56 Temperature(F): 98.2 Respiratory Rate 20 (breaths/min): Photos: [2:No Photos] [  3:No Photos] [N/A:N/A] Wound Location: [2:Left Lower Leg - Circumfernential] [3:Right Lower Leg] [N/A:N/A] Wounding Event: [2:Gradually Appeared] [3:Gradually Appeared] [N/A:N/A] Primary Etiology: [2:Lymphedema] [3:Lymphedema] [N/A:N/A] Comorbid History: [2:Anemia, Lymphedema, Chronic Obstructive Pulmonary Disease (COPD), Sleep Apnea, Congestive Heart Failure, Coronary Artery Disease, Hypertension, Peripheral Venous Disease, Type II Diabetes, Osteoarthritis, Neuropathy, Received  Chemotherapy] [3:Anemia, Lymphedema, Chronic Obstructive Pulmonary Disease (COPD), Sleep Apnea, Congestive Heart Failure, Coronary Artery Disease, Hypertension, Peripheral Venous Disease, Type II Diabetes, Osteoarthritis, Neuropathy, Received  Chemotherapy] [N/A:N/A] Date Acquired: [2:07/10/2016] [3:07/10/2016] [N/A:N/A] Weeks of Treatment: [2:7] [3:7] [N/A:N/A] Wound Status: [2:Open] [3:Open] [N/A:N/A] Measurements L x W x D 3x5x0.1 [3:0.1x0.1x0.1] [N/A:N/A] (cm) Area (cm) : [2:11.781] [3:0.008] [N/A:N/A] Volume (cm) : [2:1.178] [3:0.001] [N/A:N/A] % Reduction in Area: [2:96.30%] [3:99.80%] [N/A:N/A] % Reduction in Volume: 96.30% [3:99.80%] [N/A:N/A] Classification: [2:Partial Thickness] [3:Partial Thickness] [N/A:N/A] HBO Classification: [2:Grade 1] [3:Grade 1] [N/A:N/A] Exudate Amount: [2:Large] [3:Large] [N/A:N/A] Exudate Type: [2:Serous]  [3:Serous] [N/A:N/A] Exudate Color: [2:amber] [3:amber] [N/A:N/A] Foul Odor After Yes No N/A Cleansing: Odor Anticipated Due to No N/A N/A Product Use: Wound Margin: Flat and Intact Flat and Intact N/A Granulation Amount: Large (67-100%) Large (67-100%) N/A Granulation Quality: Pink Red N/A Necrotic Amount: None Present (0%) None Present (0%) N/A Exposed Structures: Fascia: No Fascia: No N/A Fat Layer (Subcutaneous Fat Layer (Subcutaneous Tissue) Exposed: No Tissue) Exposed: No Tendon: No Tendon: No Muscle: No Muscle: No Joint: No Joint: No Bone: No Bone: No Limited to Skin Limited to Skin Breakdown Breakdown Epithelialization: Large (67-100%) Large (67-100%) N/A Periwound Skin Texture: Excoriation: No Excoriation: No N/A Induration: No Induration: No Callus: No Callus: No Crepitus: No Crepitus: No Rash: No Rash: No Scarring: No Scarring: No Periwound Skin Maceration: Yes Maceration: No N/A Moisture: Dry/Scaly: No Dry/Scaly: No Periwound Skin Color: Atrophie Blanche: No Atrophie Blanche: No N/A Cyanosis: No Cyanosis: No Ecchymosis: No Ecchymosis: No Erythema: No Erythema: No Hemosiderin Staining: No Hemosiderin Staining: No Mottled: No Mottled: No Pallor: No Pallor: No Rubor: No Rubor: No Temperature: No Abnormality No Abnormality N/A Tenderness on Yes Yes N/A Palpation: Wound Preparation: Ulcer Cleansing: Other: Ulcer Cleansing: Other: N/A soap and water soap and water Topical Anesthetic Topical Anesthetic Applied: Other: lidocaine Applied: Other: lidocaine 4% 4% Treatment Notes Electronic Signature(s) Signed: 11/20/2016 3:03:20 PM By: Adam Fudge MD, FACS Entered By: Adam Merritt on 11/20/2016 15:03:20 Adam Merritt ([id number]) Adam Merritt, Adam Merritt ([id number]) -------------------------------------------------------------------------------- [place] Details Patient Name: Adam Merritt Date of Service: 11/20/2016 2:30  PM Medical Record Number: [id number] Patient Account Number: [id number] Date of Birth/Sex: [birth year] (71 y.o. Male) Treating RN: Adam Merritt Hora Primary Care Delyle Weider: [name] Other Clinician: Referring Stefanie Hodgens: [name] Treating Devany Aja/Extender: Frann Rider in Treatment: 7 Active Inactive ` Abuse / Safety / Falls / Self Care Management Nursing Diagnoses: Impaired physical mobility Potential for falls Goals: Patient will remain injury free Date Initiated: 09/28/2016 Target Resolution Date: 12/08/2016 Goal Status: Active Interventions: Assess fall risk on admission and as needed Notes: ` Orientation to the Wound Care Program Nursing Diagnoses: Knowledge deficit related to the wound healing center program Goals: Patient/caregiver will verbalize understanding of the [place] Date Initiated: 09/28/2016 Target Resolution Date: 12/08/2016 Goal Status: Active Interventions: Provide education on orientation to the wound center Notes: ` Wound/Skin Impairment Nursing Diagnoses: Impaired tissue integrity Adam Merritt, Adam Merritt ([id number]) Goals: Patient/caregiver will verbalize understanding of skin care regimen Date Initiated: 09/28/2016 Target Resolution Date: 12/08/2016 Goal Status: Active Ulcer/skin breakdown will have a volume reduction of 30% by week 4 Date  Initiated: 09/28/2016 Target Resolution Date: 12/08/2016 Goal Status: Active Ulcer/skin breakdown will have a volume reduction of 50% by week 8 Date Initiated: 09/28/2016 Target Resolution Date: 12/08/2016 Goal Status: Active Ulcer/skin breakdown will have a volume reduction of 80% by week 12 Date Initiated: 09/28/2016 Target Resolution Date: 12/08/2016 Goal Status: Active Ulcer/skin breakdown will heal within 14 weeks Date Initiated: 09/28/2016 Target Resolution Date: 12/09/2016 Goal Status: Active Interventions: Assess patient/caregiver ability to obtain necessary supplies Assess  patient/caregiver ability to perform ulcer/skin care regimen upon admission and as needed Assess ulceration(s) every visit Notes: Electronic Signature(s) Signed: 11/20/2016 4:28:06 PM By: Adam Merritt Hora Entered By: Adam Merritt Hora on 11/20/2016 14:42:16 Adam Merritt ([id number]) -------------------------------------------------------------------------------- Pain Assessment Details Patient Name: Adam Merritt Date of Service: 11/20/2016 2:30 PM Medical Record Number: [id number] Patient Account Number: [id number] Date of Birth/Sex: [birth year] (71 y.o. Male) Treating RN: Adam Merritt Hora Primary Care Christipher Rieger: [name] Other Clinician: Referring Shyonna Carlin: [name] Treating Veta Dambrosia/Extender: Frann Rider in Treatment: 7 Active Problems Location of Pain Severity and Description of Pain Patient Has Paino No Site Locations Pain Management and Medication Current Pain Management: Notes Topical or injectable lidocaine is offered to patient for acute pain when surgical debridement is performed. If needed, Patient is instructed to use over the counter pain medication for the following 24-48 hours after debridement. Wound care MDs do not prescribed pain medications. Patient has chronic pain or uncontrolled pain. Patient has been instructed to make an appointment with their Primary Care Physician for pain management. Electronic Signature(s) Signed: 11/20/2016 4:28:06 PM By: Adam Merritt Hora Entered By: Adam Merritt Hora on 11/20/2016 14:23:37 Adam Merritt ([id number]) -------------------------------------------------------------------------------- Patient/Caregiver Education Details Patient Name: Adam Merritt Date of Service: 11/20/2016 2:30 PM Medical Record Number: [id number] Patient Account Number: [id number] Date of Birth/Gender: Mar 04, 1946 (71 y.o. Male) Treating RN: Adam Merritt Hora Primary Care Physician: [name] Other Clinician: Referring Physician: [name] Treating Physician/Extender: Frann Rider in Treatment: 7 Education Assessment Education Provided To: Patient and Caregiver Education Topics Provided Venous: Handouts: Other: referral will be sent to lymphedema clinic Methods: Explain/Verbal Responses: State content correctly Electronic Signature(s) Signed: 11/20/2016 4:28:06 PM By: Adam Merritt Hora Entered By: Adam Merritt Hora on 11/20/2016 14:44:16 Adam Merritt ([id number]) -------------------------------------------------------------------------------- Wound Assessment Details Patient Name: Adam Merritt Date of Service: 11/20/2016 2:30 PM Medical Record Number: [id number] Patient Account Number: [id number] Date of Birth/Sex: 1946/04/29 (71 y.o. Male) Treating RN: Adam Merritt Hora Primary Care Tiburcio Linder: [name] Other Clinician: Referring Alandra Sando: [name] Treating Adam Merritt/Extender: Frann Rider in Treatment: 7 Wound Status Wound Number: 2 Primary Lymphedema Etiology: Wound Location: Left Lower Leg - Circumfernential Wound Open Status: Wounding Event: Gradually Appeared Comorbid Anemia, Lymphedema, Chronic Date Acquired: 07/10/2016 History: Obstructive Pulmonary Disease (COPD), Weeks Of Treatment: 7 Sleep Apnea, Congestive Heart Failure, Clustered Wound: No Coronary Artery Disease, Hypertension, Peripheral Venous Disease, Type II Diabetes, Osteoarthritis, Neuropathy, Received Chemotherapy Photos Photo Uploaded By: Adam Merritt Hora on 11/20/2016 15:49:21 Wound Measurements Length: (cm) 3 Width: (cm) 5 Depth: (cm) 0.1 Area: (cm) 11.781 Volume: (cm) 1.178 % Reduction in Area: 96.3% % Reduction in Volume: 96.3% Epithelialization: Large (67-100%) Tunneling: No Undermining: No Wound Description Classification: Partial Thickness Foul Odor After Diabetic Severity Adam Merritt): Grade 1 Due to Product Wound Margin: Flat and Intact Slough/Fibrino Exudate Amount: Large Exudate Type:  Serous Exudate Color: amber Adam Merritt, Adam Merritt ([id number]) Cleansing: Yes Use: No Yes Wound Bed Granulation Amount: Large (67-100%) Exposed Structure Granulation Quality: Pink Fascia Exposed: No Necrotic Amount: None Present (0%) Fat Layer (Subcutaneous  Tissue) Exposed: No Tendon Exposed: No Muscle Exposed: No Joint Exposed: No Bone Exposed: No Limited to Skin Breakdown Periwound Skin Texture Texture Color No Abnormalities Noted: No No Abnormalities Noted: No Callus: No Atrophie Blanche: No Crepitus: No Cyanosis: No Excoriation: No Ecchymosis: No Induration: No Erythema: No Rash: No Hemosiderin Staining: No Scarring: No Mottled: No Pallor: No Moisture Rubor: No No Abnormalities Noted: No Dry / Scaly: No Temperature / Pain Maceration: Yes Temperature: No Abnormality Tenderness on Palpation: Yes Wound Preparation Ulcer Cleansing: Other: soap and water, Topical Anesthetic Applied: Other: lidocaine 4%, Treatment Notes Wound #2 (Left, Circumferential Lower Leg) 1. Cleansed with: Clean wound with Normal Saline Cleanse wound with antibacterial soap and water 4. Dressing Applied: Aquacel Ag 5. Secondary Dressing Applied ABD Pad 7. Secured with Tape Other (specify in notes) Notes kerlix. Adam Merritt to Engineer, production) Signed: 11/20/2016 4:28:06 PM By: Adam Merritt ([id number]) Entered By: Adam Merritt Hora on 11/20/2016 14:36:31 Adam Merritt ([id number]) -------------------------------------------------------------------------------- Wound Assessment Details Patient Name: Adam Merritt Date of Service: 11/20/2016 2:30 PM Medical Record Number: [id number] Patient Account Number: [id number] Date of Birth/Sex: Jul 02, 1945 (71 y.o. Male) Treating RN: Adam Merritt Hora Primary Care Harrold Fitchett: [name] Other Clinician: Referring Arber Wiemers: [name] Treating Manie Bealer/Extender: Frann Rider in Treatment: 7 Wound  Status Wound Number: 3 Primary Lymphedema Etiology: Wound Location: Right Lower Leg Wound Open Wounding Event: Gradually Appeared Status: Date Acquired: 07/10/2016 Comorbid Anemia, Lymphedema, Chronic Weeks Of Treatment: 7 History: Obstructive Pulmonary Disease (COPD), Clustered Wound: No Sleep Apnea, Congestive Heart Failure, Coronary Artery Disease, Hypertension, Peripheral Venous Disease, Type II Diabetes, Osteoarthritis, Neuropathy, Received Chemotherapy Photos Photo Uploaded By: Adam Merritt Hora on 11/20/2016 15:48:47 Wound Measurements Length: (cm) 0.1 Width: (cm) 0.1 Depth: (cm) 0.1 Area: (cm) 0.008 Volume: (cm) 0.001 % Reduction in Area: 99.8% % Reduction in Volume: 99.8% Epithelialization: Large (67-100%) Tunneling: No Undermining: No Wound Description Classification: Partial Thickness Foul Odor After Diabetic Severity Adam Merritt): Grade 1 Slough/Fibrino Wound Margin: Flat and Intact Exudate Amount: Large Exudate Type: Serous Exudate Color: amber Adam Merritt, Adam Merritt ([id number]) Cleansing: No No Wound Bed Granulation Amount: Large (67-100%) Exposed Structure Granulation Quality: Red Fascia Exposed: No Necrotic Amount: None Present (0%) Fat Layer (Subcutaneous Tissue) Exposed: No Tendon Exposed: No Muscle Exposed: No Joint Exposed: No Bone Exposed: No Limited to Skin Breakdown Periwound Skin Texture Texture Color No Abnormalities Noted: No No Abnormalities Noted: No Callus: No Atrophie Blanche: No Crepitus: No Cyanosis: No Excoriation: No Ecchymosis: No Induration: No Erythema: No Rash: No Hemosiderin Staining: No Scarring: No Mottled: No Pallor: No Moisture Rubor: No No Abnormalities Noted: No Dry / Scaly: No Temperature / Pain Maceration: No Temperature: No Abnormality Tenderness on Palpation: Yes Wound Preparation Ulcer Cleansing: Other: soap and water, Topical Anesthetic Applied: Other: lidocaine 4%, Treatment Notes Wound #3 (Right  Lower Leg) 1. Cleansed with: Clean wound with Normal Saline Cleanse wound with antibacterial soap and water 4. Dressing Applied: Aquacel Ag 5. Secondary Dressing Applied ABD Pad 7. Secured with Tape Other (specify in notes) Notes kerlix. Adam Merritt to Engineer, production) Signed: 11/20/2016 4:28:06 PM By: Adam Merritt ([id number]) Entered By: Adam Merritt Hora on 11/20/2016 14:36:48 Adam Merritt ([id number]) -------------------------------------------------------------------------------- Vitals Details Patient Name: Adam Merritt Date of Service: 11/20/2016 2:30 PM Medical Record Number: [id number] Patient Account Number: [id number] Date of Birth/Sex: 03/02/1946 (72 y.o. Male) Treating RN: Adam Merritt Hora Primary Care Erian Lariviere: [name] Other Clinician: Referring Amanda Steuart: [name] Treating Ashanta Amoroso/Extender: Frann Rider in  Treatment: 7 Vital Signs Time Taken: 14:23 Temperature (F): 98.2 Height (in): 65 Pulse (bpm): 80 Weight (lbs): 336 Respiratory Rate (breaths/min): 20 Body Mass Index (BMI): 55.9 Blood Pressure (mmHg): 132/71 Reference Range: 80 - 120 mg / dl Electronic Signature(s) Signed: 11/20/2016 4:28:06 PM By: Adam Merritt Hora Entered By: Adam Merritt Hora on 11/20/2016 14:23:56

## 2016-11-27 ENCOUNTER — Encounter: Payer: Medicare Other | Admitting: Physician Assistant

## 2016-11-27 DIAGNOSIS — L97221 Non-pressure chronic ulcer of left calf limited to breakdown of skin: Secondary | ICD-10-CM | POA: Diagnosis not present

## 2016-11-28 NOTE — Progress Notes (Signed)
[employer] ([id number]) Visit Report for 11/27/2016 Chief Complaint Document Details Patient Name: Adam Merritt Date of Service: 11/27/2016 3:45 PM Medical Record Number: [id number] Patient Account Number: [id number] Date of Birth/Sex: 01-04-1946 (71 y.o. Male) Treating RN: Cornell Barman Primary Care Provider: [name] Other Clinician: Referring Provider: [name] Treating Provider/Extender: Melburn Hake, [name]: 8 Information Obtained from: Patient Chief Complaint the patient comes along with his wife for excessive swelling of both lower extremities left more than right and weeping of fluid Electronic Signature(s) Signed: 11/27/2016 5:09:57 PM By: Worthy Keeler PA-C Entered By: Worthy Keeler on 11/27/2016 16:22:49 Adam Merritt ([id number]) -------------------------------------------------------------------------------- HPI Details Patient Name: Adam Merritt Date of Service: 11/27/2016 3:45 PM Medical Record Number: [id number] Patient Account Number: [id number] Date of Birth/Sex: [birth year] (71 y.o. Male) Treating RN: Cornell Barman Primary Care Provider: [name] Other Clinician: Referring Provider: [name] Treating Provider/Extender: Melburn Hake, [name]: 8 History of Present Illness Location: bilateral lower leg edema with some blisters recently and weeping of fluid left side more than right Quality: Patient has no wounds Severity: Patient states wound are getting better Duration: Patient has had the wound for > 3 months prior to seeking treatment at the wound center Timing: Patient has no pain Context: The wound appeared gradually over time Modifying Factors: Consults to this date include: he has been seen by his PCP and she sent him off to see [place]. Associated Signs and Symptoms: Patient reports having difficulty standing for long periods. HPI Description: 71 year old gentleman seen earlier at our clinic in June 2016 and again  in June 2017 is noncompliant with recommendations and does not follow-up regularly. 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 He had referred him to the lymphedema clinic and he was accepted but he did not follow-up with them. At that stage he had agreed to get a repeat arterial study done and he has seen Dr. Trula Slade in [place]. This time around he has been seen by his PCP Dr. Tiburcio Bash who referred him urgently to [place] for bilateral lower extremity cellulitis in spite of being on antibiotics -- he was placed on Keflex and 500 mg 3 times a day for 10 days. His comorbidities include coronary artery disease, diabetes mellitus, hypertension, Bell's palsy and chronic lymphedema. he was also recently admitted to the hospital on April 13 and kept overnight with the diagnosis of a TIA and a left ICA stenosis to follow-up with vascular surgery as an outpatient 10/05/2016 -- patient has been compliant this week and left his compression and dressing on for the entire week and had home health change it appropriately. 11/13/2016 -- his insurance will not cover compression stockings for lymphedema and hence we will have to send him to the lymphedema clinic as soon as we have finished with his healing. 11/20/2016 -- his lymphedema clinic appointment is still pending but other than that he has done very well and we are ready to transition him over. 11/27/16 patient's wounds appeared to be completely healed on evaluation today. He still has bilateral left edema and has been referred to the lymphedema clinic although we are still waiting to hear back from them on scheduling. Apparently she has to be discharged from home health  services prior to being able to establish with the lymphedema clinic. At this  point considering he is completely healed from the standpoint of wounds of the bilateral lower extremities I think it is appropriate to discharge him from home health and Adam Merritt, Adam Merritt. ([id number]) initiate weekly wraps on our end. There is no evidence of infection. ==== Old Notes: 71 year old gentleman who comes with bilateral lower limb edema for over 20 years and recent attack of blisters and redness for about 2 weeks. The patient says he has had bilateral lower limb edema since 1996. He has been noncompliant with wearing wraps in the past and even was given a boot to wear at night which she never tolerated and didn't wear it. He has a past medical history of sleep apnea, neuropathy with diabetes, hypertension, hyperlipidemia, coronary artery disease, varicose veins, carotid artery disease and is status post CABG and cholecystectomy. He was seen by the vascular surgeon on 09/29/2013 and Dr. Trula Slade opinion was:"I have reviewed his outside ultrasound which shows an ankle-brachial index of 0.72 on the left and 1.0 on the right.o This study was repeated at our office.o The ankle-brachial index on the right was 1.26 with triphasic waveforms.o On the left it was a 1.04 with biphasic waveforms.o The patient had a toe pressure of 121 on the right and 100 on the left. In summary, I would not recommend any further imaging , as I do not think his symptoms are related to arterial insufficiency, but rather secondary to diabetic neuropathy.o The patient still has room for increasing his Neurontin dose, which he states does help him.o I have scheduled him to come back in one year for a repeat arterial evaluation." in June 2016, a year ago, he has a venous duplex study to be done. He was also going to be accepted to the lymphedema clinic but I did not believe the patient went regularly for this. ==== Electronic Signature(s) Signed: 11/27/2016 5:09:57 PM By: Worthy Keeler PA-C Entered By: Worthy Keeler on 11/27/2016 16:35:24 Adam Merritt ([id number]) -------------------------------------------------------------------------------- Physical Exam Details Patient Name: Adam Merritt Date of Service: 11/27/2016 3:45 PM Medical Record Number: [id number] Patient Account Number: [id number] Date of Birth/Sex: 1945-08-15 (71 y.o. Male) Treating RN: Cornell Barman Primary Care Provider: [name] Other Clinician: Referring Provider: [name] Treating Provider/Extender: Melburn Hake, [name]: 8 Constitutional Obese and well-hydrated in no acute distress. Respiratory normal breathing without difficulty. clear to auscultation bilaterally. Cardiovascular Bilateral lower extremity lymphedema. Psychiatric this patient is able to make decisions and demonstrates good insight into disease process. Alert and Oriented x 3. pleasant and cooperative. Notes But lymphedema but no wounds noted on inspection today. Electronic Signature(s) Signed: 11/27/2016 5:09:57 PM By: Worthy Keeler PA-C Entered By: Worthy Keeler on 11/27/2016 16:36:37 Adam Merritt ([id number]) -------------------------------------------------------------------------------- Physician Orders Details Patient Name: Adam Merritt Date of Service: 11/27/2016 3:45 PM Medical Record Number: [id number] Patient Account Number: [id number] Date of Birth/Sex: 25-Aug-1945 (71 y.o. 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 On the phone today they told us they could get him in for evaluation rather quickly initially but to fully assume care I'm not sure how long that will take. In the interim we will continue to perform the compression wrapping for him in order to prevent further outbreak of wounds. Patient is in agreement with this plan and his wife was present during evaluation today. Electronic Signature(s) Signed: 11/27/2016 5:09:57 PM By: Worthy Keeler PA-C Entered By: Worthy Keeler on 11/27/2016 16:37:47 Adam Merritt ([id number]) -------------------------------------------------------------------------------- Problem List Details Patient Name: Adam Merritt Date of Service: 11/27/2016 3:45 PM Medical Record Number: [id number] Patient Account Number: [id number] Date of Birth/Sex: 1946/04/06 (71 y.o. 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 Male) Treating RN: Cornell Barman Primary Care Provider: [name] Other Clinician: Referring Provider: [name] Treating Provider/Extender: Melburn Hake, [name]: 8 Subjective Chief Complaint Information obtained from Patient the patient comes along with his wife for excessive swelling of both lower extremities left more than right and weeping of fluid History of Present Illness (HPI) The following HPI elements were documented for the patient's wound: Location: bilateral lower leg edema with some blisters recently and weeping of fluid left side more than right Quality: Patient has no wounds Severity: Patient states wound are getting better Duration: Patient has had the wound for > 3 months prior to seeking treatment at the wound center Timing: Patient has no pain Context: The wound appeared gradually over time Modifying Factors: Consults to this date include: he has been seen by his PCP and she sent him off to see [place]. Associated Signs and Symptoms: Patient reports having difficulty standing for long periods. 71 year old gentleman seen earlier at our clinic in June 2016 and again in June 2017 is noncompliant with recommendations and does not follow-up regularly. 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 He had referred him to the lymphedema clinic and he was accepted but he  did not follow-up with them. At that stage he had agreed to get a repeat arterial study done and he has seen Dr. Trula Slade in [place]. This time around he has been seen by his PCP Dr. Tiburcio Bash who referred him urgently to [place] for bilateral lower extremity cellulitis in spite of being on antibiotics -- he was placed on Keflex and 500 mg 3 times a day for 10 days. His comorbidities include coronary artery disease, diabetes mellitus, hypertension, Bell's palsy and chronic lymphedema. he was also recently admitted to the hospital on April 13 and kept overnight with the diagnosis of a TIA and a left ICA stenosis to follow-up with vascular surgery as an outpatient 10/05/2016 -- patient has been compliant this week and left his compression and dressing on for the entire week and had home health change it appropriately. 11/13/2016 -- his insurance will not cover compression stockings for lymphedema and hence we will have to send him to the lymphedema clinic as soon as we have finished with his healing. 11/20/2016 -- his lymphedema clinic appointment is still pending but other than that he has done very well Adam Merritt, Adam Merritt. ([id number]) and we are ready to transition him over. 11/27/16 patient's wounds appeared to be completely healed on evaluation today. He still has bilateral left edema and has been referred to the lymphedema clinic although we are still waiting to hear back from them on scheduling. Apparently she has to be discharged from home health services prior to being able to establish with the lymphedema clinic. At this point considering he is completely healed from the standpoint of wounds of the bilateral lower extremities I think it is appropriate to discharge him from home health and initiate weekly  wraps on our end. There is no evidence of infection. ==== Old Notes: 71 year old gentleman who comes with bilateral lower limb edema for over 20 years and recent attack of blisters and redness for about 2 weeks. The patient says he has had bilateral lower limb edema since 1996. He has been noncompliant with wearing wraps in the past and even was given a boot to wear at night which she never tolerated and didn't wear it. He has a past medical history of sleep apnea, neuropathy with diabetes, hypertension, hyperlipidemia, coronary artery disease, varicose veins, carotid artery disease and is status post CABG and cholecystectomy. He was seen by the vascular surgeon on 09/29/2013 and Dr. Trula Slade opinion was:"I have reviewed his outside ultrasound which shows an ankle-brachial index of 0.72 on the left and 1.0 on the right. This study was repeated at our office. The ankle-brachial index on the right was 1.26 with triphasic waveforms. On the left it was a 1.04 with biphasic waveforms. The patient had a toe pressure of 121 on the right and 100 on the left. In summary, I would not recommend any further imaging , as I do not think his symptoms are related to arterial insufficiency, but rather secondary to diabetic neuropathy. The patient still has room for increasing his Neurontin dose, which he states does help him. I have scheduled him to come back in one year for a repeat arterial evaluation." in June 2016, a year ago, he has a venous duplex study to be done. He was also going to be accepted to the lymphedema clinic but I did not believe the patient went regularly for this. ==== Objective Constitutional Obese and well-hydrated in no acute distress. Vitals Time Taken: 3:55 PM, Height: 65 in, Weight: 336 lbs, BMI: 55.9,  Temperature: 97.7 F, Pulse: 76 bpm, Respiratory Rate: 18 breaths/min, Blood Pressure: 106/63 mmHg. Respiratory Adam Merritt, Adam Merritt ([id number]) normal breathing without difficulty.  clear to auscultation bilaterally. Cardiovascular Bilateral lower extremity lymphedema. Psychiatric this patient is able to make decisions and demonstrates good insight into disease process. Alert and Oriented x 3. pleasant and cooperative. General Notes: But lymphedema but no wounds noted on inspection today. Integumentary (Hair, Skin) Wound #2 status is Healed - Epithelialized. Original cause of wound was Gradually Appeared. The wound is located on the Left,Circumferential Lower Leg. The wound measures 0cm length x 0cm width x 0cm depth; 0cm^2 area and 0cm^3 volume. The wound is limited to skin breakdown. There is a none present amount of drainage noted. The wound margin is flat and intact. There is large (67-100%) pink granulation within the wound bed. There is no necrotic tissue within the wound bed. The periwound skin appearance exhibited: Maceration. The periwound skin appearance did not exhibit: Callus, Crepitus, Excoriation, Induration, Rash, Scarring, Dry/Scaly, Atrophie Blanche, Cyanosis, Ecchymosis, Hemosiderin Staining, Mottled, Pallor, Rubor, Erythema. Periwound temperature was noted as No Abnormality. The periwound has tenderness on palpation. Wound #3 status is Healed - Epithelialized. Original cause of wound was Gradually Appeared. The wound is located on the Right Lower Leg. The wound measures 0cm length x 0cm width x 0cm depth; 0cm^2 area and 0cm^3 volume. The wound is limited to skin breakdown. There is no tunneling or undermining noted. There is a large amount of serous drainage noted. The wound margin is flat and intact. There is large (67- 100%) red granulation within the wound bed. There is no necrotic tissue within the wound bed. The periwound skin appearance did not exhibit: Callus, Crepitus, Excoriation, Induration, Rash, Scarring, Dry/Scaly, Maceration, Atrophie Blanche, Cyanosis, Ecchymosis, Hemosiderin Staining, Mottled, Pallor, Rubor, Erythema. Periwound  temperature was noted as No Abnormality. The periwound has tenderness on palpation. 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 - Non-pressure chronic ulcer of right ankle limited to breakdown of skin Adam Merritt, Adam Merritt ([id number]) Plan Wound Cleansing: Cleanse wound with mild soap and water Anesthetic: Topical Lidocaine 4% cream applied to wound bed prior to debridement Secondary Dressing: Gauze and Kerlix/Conform Edema Control: Elevate legs to the level of the heart and pump ankles as often as possible Additional Orders / Instructions: Other: - Referral to Lymphedema Clinic Home Health: D/C Home Health Services - Wounds healed General Notes: Will continue to see this patient for wraps weekly to provide compression until his care is fully taken over by the lymphedema clinic. On the phone today they told us they could get him in for evaluation rather quickly initially but to fully assume care I'm not sure how long that will take. In the interim we will continue to perform the compression wrapping for him in order to prevent further outbreak of wounds. Patient is in agreement with this plan and his wife was present during evaluation today. Electronic Signature(s) Signed: 11/27/2016 5:09:57 PM By: Worthy Keeler PA-C Entered By: Worthy Keeler on 11/27/2016 16:38:28 Adam Merritt, Adam Merritt ([id number]) -------------------------------------------------------------------------------- SuperBill Details Patient Name: Adam Merritt Date of Service: 11/27/2016 Medical Record Number: [id number] Patient Account Number: [id number] Date of Birth/Sex: 04-26-1946 (71 y.o. 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

## 2016-11-28 NOTE — Progress Notes (Signed)
Adam Merritt, Adam Merritt ([id number]) Visit Report for 11/27/2016 Arrival Information Details Patient Name: Adam Merritt Date of Service: 11/27/2016 3:45 PM Medical Record Number: [id number] Patient Account Number: [id number] Date of Birth/Sex: May 06, 1946 (71 y.o. Male) Treating RN: Cornell Barman Primary Care Uchenna Seufert: [name] Other Clinician: Referring Naiomy Watters: [name] Treating Adam Demary/Extender: Melburn Hake, [name]: 8 Visit Information History Since Last Visit Added or deleted any medications: No Patient Arrived: Ambulatory Any new allergies or adverse reactions: No Arrival Time: 15:54 Had a fall or experienced change in No Accompanied By: wife activities of daily living that may affect Transfer Assistance: None risk of falls: Patient Identification Verified: Yes Signs or symptoms of abuse/neglect since last No Secondary Verification Process Yes visito Completed: Hospitalized since last visit: No Patient Requires Transmission- No Has Dressing in Place as Prescribed: Yes Based Precautions: Has Compression in Place as Prescribed: Yes Patient Has Alerts: Yes Pain Present Now: No Patient Alerts: DMII ABI [place] BILATERAL >220 Electronic Signature(s) Signed: 11/27/2016 4:17:42 PM By: Gretta Cool, BSN, RN, CWS, Kim RN, BSN Entered By: Gretta Cool, BSN, RN, CWS, Kim on 11/27/2016 15:54:57 Adam Merritt ([id number]) -------------------------------------------------------------------------------- Clinic Level of Care Assessment Details Patient Name: Adam Merritt Date of Service: 11/27/2016 3:45 PM Medical Record Number: [id number] Patient Account Number: [id number] Date of Birth/Sex: 01-15-1946 (71 y.o. Male) Treating RN: Cornell Barman Primary Care Evangelyne Loja: [name] Other Clinician: Referring Vidyuth Belsito: [name] Treating Brevin Mcfadden/Extender: Melburn Hake, [name]: 8 Clinic Level of Care Assessment Items TOOL 4 Quantity Score []  - Use when only an EandM is  performed on FOLLOW-UP visit 0 ASSESSMENTS - Nursing Assessment / Reassessment []  - Reassessment of Co-morbidities (includes updates in patient status) 0 X - Reassessment of Adherence to Treatment Plan 1 5 ASSESSMENTS - Wound and Skin Assessment / Reassessment X - Simple Wound Assessment / Reassessment - one wound 1 5 []  - Complex Wound Assessment / Reassessment - multiple wounds 0 []  - Dermatologic / Skin Assessment (not related to wound area) 0 ASSESSMENTS - Focused Assessment []  - Circumferential Edema Measurements - multi extremities 0 []  - Nutritional Assessment / Counseling / Intervention 0 []  - Lower Extremity Assessment (monofilament, tuning fork, pulses) 0 []  - Peripheral Arterial Disease Assessment (using hand held doppler) 0 ASSESSMENTS - Ostomy and/or Continence Assessment and Care []  - Incontinence Assessment and Management 0 []  - Ostomy Care Assessment and Management (repouching, etc.) 0 PROCESS - Coordination of Care X - Simple Patient / Family Education for ongoing care 1 15 []  - Complex (extensive) Patient / Family Education for ongoing care 0 []  - Staff obtains Programmer, systems, Records, Test Results / Process Orders 0 []  - Staff telephones HHA, Nursing Homes / Clarify orders / etc 0 []  - Routine Transfer to another Facility (non-emergent condition) 0 CURVIN, HUNGER ([id number]) []  - Routine Hospital Admission (non-emergent condition) 0 []  - New Admissions / Biomedical engineer / Ordering NPWT, Apligraf, etc. 0 []  - Emergency Hospital Admission (emergent condition) 0 X - Simple Discharge Coordination 1 10 []  - Complex (extensive) Discharge Coordination 0 PROCESS - Special Needs []  - Pediatric / Minor Patient Management 0 []  - Isolation Patient Management 0 []  - Hearing / Language / Visual special needs 0 []  - Assessment of Community assistance (transportation, D/C planning, etc.) 0 []  - Additional assistance / Altered mentation 0 []  - Support Surface(s) Assessment  (bed, cushion, seat, etc.) 0 INTERVENTIONS - Wound Cleansing / Measurement X - Simple Wound Cleansing - one wound 1 5 []  -  Complex Wound Cleansing - multiple wounds 0 X - Wound Imaging (photographs - any number of wounds) 1 5 []  - Wound Tracing (instead of photographs) 0 X - Simple Wound Measurement - one wound 1 5 []  - Complex Wound Measurement - multiple wounds 0 INTERVENTIONS - Wound Dressings []  - Small Wound Dressing one or multiple wounds 0 []  - Medium Wound Dressing one or multiple wounds 0 X - Large Wound Dressing one or multiple wounds 2 20 []  - Application of Medications - topical 0 []  - Application of Medications - injection 0 INTERVENTIONS - Miscellaneous []  - External ear exam 0 Adam Merritt ([id number]) []  - Specimen Collection (cultures, biopsies, blood, body fluids, etc.) 0 []  - Specimen(s) / Culture(s) sent or taken to Lab for analysis 0 []  - Patient Transfer (multiple staff / Harrel Lemon Lift / Similar devices) 0 []  - Simple Staple / Suture removal (25 or less) 0 []  - Complex Staple / Suture removal (26 or more) 0 []  - Hypo / Hyperglycemic Management (close monitor of Blood Glucose) 0 []  - Ankle / Brachial Index (ABI) - do not check if billed separately 0 X - Vital Signs 1 5 Has the patient been seen at the hospital within the last three years: Yes Total Score: 95 Level Of Care: New/Established - Level 3 Electronic Signature(s) Signed: 11/27/2016 5:15:36 PM By: Gretta Cool, BSN, RN, CWS, Kim RN, BSN Entered By: Gretta Cool, BSN, RN, CWS, Kim on 11/27/2016 16:51:56 Adam Merritt ([id number]) -------------------------------------------------------------------------------- Encounter Discharge Information Details Patient Name: Adam Merritt Date of Service: 11/27/2016 3:45 PM Medical Record Number: [id number] Patient Account Number: [id number] Date of Birth/Sex: 08-04-1945 (71 y.o. Male) Treating RN: Cornell Barman Primary Care Shenetta Schnackenberg: [name] Other Clinician: Referring  Michol Emory: [name] Treating Ileane Sando/Extender: Melburn Hake, [name]: 8 Encounter Discharge Information Items Discharge Pain Level: 3 Discharge Condition: Stable Ambulatory Status: Ambulatory Discharge Destination: Home Transportation: Private Auto Accompanied By: self Schedule Follow-up Appointment: Yes Medication Reconciliation completed and provided to Patient/Care Yes Oluwatomisin Hustead: Provided on Clinical Summary of Care: 11/27/2016 Form Type Recipient Paper Patient JT Electronic Signature(s) Signed: 11/27/2016 4:53:10 PM By: Gretta Cool, BSN, RN, CWS, Kim RN, BSN Previous Signature: 11/27/2016 4:39:26 PM Version By: Ruthine Dose Entered By: Gretta Cool, BSN, RN, CWS, Kim on 11/27/2016 16:53:10 Adam Merritt ([id number]) -------------------------------------------------------------------------------- Lower Extremity Assessment Details Patient Name: Adam Merritt Date of Service: 11/27/2016 3:45 PM Medical Record Number: [id number] Patient Account Number: [id number] Date of Birth/Sex: 04/25/1946 (71 y.o. Male) Treating RN: Cornell Barman Primary Care Lenah Messenger: [name] Other Clinician: Referring Chaniya Genter: [name] Treating Jafari Mckillop/Extender: Melburn Hake, [name]: 8 Edema Assessment Assessed: [Left: No] [Right: No] E[Left: dema] [Right: :] Calf Left: Right: Point of Measurement: 34 cm From Medial Instep 55.8 cm 49 cm Ankle Left: Right: Point of Measurement: 12 cm From Medial Instep 34.5 cm 29.9 cm Vascular Assessment Pulses: Dorsalis Pedis Palpable: [Left:Yes] [Right:Yes] Posterior Tibial Extremity colors, hair growth, and conditions: Extremity Color: [Left:Hyperpigmented] [Right:Hyperpigmented] Hair Growth on Extremity: [Left:No] [Right:No] Temperature of Extremity: [Left:Warm] [Right:Warm] Capillary Refill: [Left:< 3 seconds] [Right:< 3 seconds] Dependent Rubor: [Left:No] [Right:No] Blanched when Elevated: [Left:No]  [Right:No] Lipodermatosclerosis: [Left:No] [Right:No] Toe Nail Assessment Left: Right: Thick: Yes Yes Discolored: Yes Yes Deformed: Yes Yes Improper Length and Hygiene: Yes Yes Electronic Signature(s) Signed: 11/27/2016 4:17:42 PM By: Gretta Cool, BSN, RN, CWS, Kim RN, BSN [address] [place] ([id number]) Entered By: Gretta Cool, BSN, RN, CWS, Kim on 11/27/2016 16:09:06 Adam Merritt ([id number]) -------------------------------------------------------------------------------- Multi Wound Chart  Details Patient Name: NICHALAS, COIN Date of Service: 11/27/2016 3:45 PM Medical Record Number: [id number] Patient Account Number: [id number] Date of Birth/Sex: 05/11/1946 (71 y.o. Male) Treating RN: Cornell Barman Primary Care [name] [name]: [name] Other Clinician: Referring Kacyn Souder: [name] Treating Selmer Adduci/Extender: Melburn Hake, [name]: 8 Vital Signs Height(in): 65 Pulse(bpm): 76 Weight(lbs): 336 Blood Pressure 106/63 (mmHg): Body Mass Index(BMI): 56 Temperature(F): 97.7 Respiratory Rate 18 (breaths/min): Photos: [N/A:N/A] Wound Location: Left Lower Leg - Right Lower Leg N/A Circumfernential Wounding Event: Gradually Appeared Gradually Appeared N/A Primary Etiology: Lymphedema Lymphedema N/A Comorbid History: Anemia, Lymphedema, Anemia, Lymphedema, N/A Chronic Obstructive Chronic Obstructive Pulmonary Disease Pulmonary Disease (COPD), Sleep Apnea, (COPD), Sleep Apnea, Congestive Heart Failure, Congestive Heart Failure, Coronary Artery Disease, Coronary Artery Disease, Hypertension, Peripheral Hypertension, Peripheral Venous Disease, Type II Venous Disease, Type II Diabetes, Osteoarthritis, Diabetes, Osteoarthritis, Neuropathy, Received Neuropathy, Received Chemotherapy Chemotherapy Date Acquired: 07/10/2016 07/10/2016 N/A Weeks of Treatment: 8 8 N/A Wound Status: Healed - Epithelialized Healed - Epithelialized N/A Measurements L x W x D 0x0x0 0x0x0 N/A (cm) Area (cm) :  0 0 N/A Volume (cm) : 0 0 N/A % Reduction in Area: 100.00% 100.00% N/A % Reduction in Volume: 100.00% 100.00% N/A Classification: Partial Thickness Partial Thickness N/A FLEM, ENDERLE ([id number]) HBO Classification: Grade 1 Grade 1 N/A Exudate Amount: None Present Large N/A Exudate Type: N/A Serous N/A Exudate Color: N/A amber N/A Foul Odor After Yes No N/A Cleansing: Odor Anticipated Due to No N/A N/A Product Use: Wound Margin: Flat and Intact Flat and Intact N/A Granulation Amount: Large (67-100%) Large (67-100%) N/A Granulation Quality: Pink Red N/A Necrotic Amount: None Present (0%) None Present (0%) N/A Exposed Structures: Fascia: No Fascia: No N/A Fat Layer (Subcutaneous Fat Layer (Subcutaneous Tissue) Exposed: No Tissue) Exposed: No Tendon: No Tendon: No Muscle: No Muscle: No Joint: No Joint: No Bone: No Bone: No Limited to Skin Limited to Skin Breakdown Breakdown Epithelialization: Large (67-100%) Large (67-100%) N/A Periwound Skin Texture: Excoriation: No Excoriation: No N/A Induration: No Induration: No Callus: No Callus: No Crepitus: No Crepitus: No Rash: No Rash: No Scarring: No Scarring: No Periwound Skin Maceration: Yes Maceration: No N/A Moisture: Dry/Scaly: No Dry/Scaly: No Periwound Skin Color: Atrophie Blanche: No Atrophie Blanche: No N/A Cyanosis: No Cyanosis: No Ecchymosis: No Ecchymosis: No Erythema: No Erythema: No Hemosiderin Staining: No Hemosiderin Staining: No Mottled: No Mottled: No Pallor: No Pallor: No Rubor: No Rubor: No Temperature: No Abnormality No Abnormality N/A Tenderness on Yes Yes N/A Palpation: Wound Preparation: Ulcer Cleansing: Other: Ulcer Cleansing: Other: N/A soap and water soap and water Topical Anesthetic Topical Anesthetic Applied: Other: lidocaine Applied: Other: lidocaine 4% 4% Treatment Notes MITSURU, DAULT ([id number]) Electronic Signature(s) Signed: 11/27/2016 5:15:36 PM By: Gretta Cool,  BSN, RN, CWS, Kim RN, BSN Previous Signature: 11/27/2016 4:15:25 PM Version By: Gretta Cool, BSN, RN, CWS, Kim RN, BSN Entered By: Gretta Cool, BSN, RN, CWS, Kim on 11/27/2016 16:24:32 Adam Merritt ([id number]) -------------------------------------------------------------------------------- [place] Details Patient Name: HILLMAN, ATTIG Date of Service: 11/27/2016 3:45 PM Medical Record Number: [id number] Patient Account Number: [id number] Date of Birth/Sex: 14-Sep-1945 (71 y.o. Male) Treating RN: Cornell Barman Primary Care Rosaleah Person: [name] Other Clinician: Referring Yurianna Tusing: [name] Treating Danyela Posas/Extender: Melburn Hake, [name]: 8 Active Inactive ` Abuse / Safety / Falls / Self Care Management Nursing Diagnoses: Impaired physical mobility Potential for falls Goals: Patient will remain injury free Date Initiated: 09/28/2016 Target Resolution Date: 12/08/2016 Goal Status: Active Interventions: Assess fall  risk on admission and as needed Notes: ` Orientation to the Wound Care Program Nursing Diagnoses: Knowledge deficit related to the wound healing center program Goals: Patient/caregiver will verbalize understanding of the [place] Date Initiated: 09/28/2016 Target Resolution Date: 12/08/2016 Goal Status: Active Interventions: Provide education on orientation to the wound center Notes: ` Wound/Skin Impairment Nursing Diagnoses: Impaired tissue integrity ADISON, JERGER ([id number]) Goals: Patient/caregiver will verbalize understanding of skin care regimen Date Initiated: 09/28/2016 Target Resolution Date: 12/08/2016 Goal Status: Active Ulcer/skin breakdown will have a volume reduction of 30% by week 4 Date Initiated: 09/28/2016 Target Resolution Date: 12/08/2016 Goal Status: Active Ulcer/skin breakdown will have a volume reduction of 50% by week 8 Date Initiated: 09/28/2016 Target Resolution Date: 12/08/2016 Goal Status:  Active Ulcer/skin breakdown will have a volume reduction of 80% by week 12 Date Initiated: 09/28/2016 Target Resolution Date: 12/08/2016 Goal Status: Active Ulcer/skin breakdown will heal within 14 weeks Date Initiated: 09/28/2016 Target Resolution Date: 12/09/2016 Goal Status: Active Interventions: Assess patient/caregiver ability to obtain necessary supplies Assess patient/caregiver ability to perform ulcer/skin care regimen upon admission and as needed Assess ulceration(s) every visit Notes: Electronic Signature(s) Signed: 11/27/2016 4:17:42 PM By: Gretta Cool, BSN, RN, CWS, Kim RN, BSN Entered By: Gretta Cool, BSN, RN, CWS, Kim on 11/27/2016 16:09:11 Adam Merritt ([id number]) -------------------------------------------------------------------------------- Pain Assessment Details Patient Name: Adam Merritt Date of Service: 11/27/2016 3:45 PM Medical Record Number: [id number] Patient Account Number: [id number] Date of Birth/Sex: [birth year] (71 y.o. Male) Treating RN: Cornell Barman Primary Care Mauri Temkin: [name] Other Clinician: Referring Danisa Kopec: [name] Treating Vastie Douty/Extender: Melburn Hake, [name]: 8 Active Problems Location of Pain Severity and Description of Pain Patient Has Paino No Site Locations With Dressing Change: No Duration of the Pain. Constant / Intermittento Intermittent Rate the pain. Current Pain Level: 3 Character of Pain Describe the Pain: Burning Pain Management and Medication Current Pain Management: Goals for Pain Management Topical or injectable lidocaine is offered to patient for acute pain when surgical debridement is performed. If needed, Patient is instructed to use over the counter pain medication for the following 24-48 hours after debridement. Wound care MDs do not prescribed pain medications. Patient has chronic pain or uncontrolled pain. Patient has been instructed to make an appointment with their Primary Care Physician for pain  management. Electronic Signature(s) Signed: 11/27/2016 4:17:42 PM By: Gretta Cool, BSN, RN, CWS, Kim RN, BSN Entered By: Gretta Cool, BSN, RN, CWS, Kim on 11/27/2016 15:55:28 Adam Merritt ([id number]) -------------------------------------------------------------------------------- Patient/Caregiver Education Details Patient Name: Adam Merritt Date of Service: 11/27/2016 3:45 PM Medical Record Number: [id number] Patient Account Number: [id number] Date of Birth/Gender: [birth year] (71 y.o. Male) Treating RN: Cornell Barman Primary Care Physician: [name] Other Clinician: Referring Physician: [name] Treating Physician/Extender: Sharalyn Ink in Treatment: 8 Education Assessment Education Provided To: Patient Education Topics Provided Wound/Skin Impairment: Handouts: Caring for Your Ulcer Methods: Demonstration Responses: State content correctly Electronic Signature(s) Signed: 11/27/2016 5:15:36 PM By: Gretta Cool, BSN, RN, CWS, Kim RN, BSN Entered By: Gretta Cool, BSN, RN, CWS, Kim on 11/27/2016 16:54:27 Adam Merritt ([id number]) -------------------------------------------------------------------------------- Wound Assessment Details Patient Name: Adam Merritt Date of Service: 11/27/2016 3:45 PM Medical Record Number: [id number] Patient Account Number: [id number] Date of Birth/Sex: 1945-06-13 (71 y.o. 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  Wound Healed - Epithelialized Status: Wounding Event: Gradually Appeared Comorbid Anemia, Lymphedema, Chronic Date Acquired: 07/10/2016 History: Obstructive Pulmonary Disease (COPD), Weeks Of Treatment: 8 Sleep Apnea, Congestive Heart Failure, Clustered Wound: No Coronary Artery Disease,  Hypertension, Peripheral Venous Disease, Type II Diabetes, Osteoarthritis, Neuropathy, Received Chemotherapy Photos Wound Measurements Length: (cm) 0 % Reduction Width: (cm) 0 % Reduction Depth: (cm) 0 Epitheliali Area: (cm) 0 Volume: (cm) 0 in Area: 100% in Volume: 100% zation: Large (67-100%) Wound Description Classification: Partial Thickness Foul Odor A Diabetic Severity (Wagner): Grade 1 Due to Prod Wound Margin: Flat and Intact Slough/Fibr Exudate Amount: None Present fter Cleansing: Yes uct Use: No ino Yes Wound Bed Granulation Amount: Large (67-100%) Exposed Structure Granulation Quality: Pink Fascia Exposed: No Necrotic Amount: None Present (0%) Fat Layer (Subcutaneous Tissue) Exposed: No Tendon Exposed: No DAVONTAY, [place] ([id number]) Muscle Exposed: No Joint Exposed: No Bone Exposed: No Limited to Skin Breakdown Periwound Skin Texture Texture Color No Abnormalities Noted: No No Abnormalities Noted: No Callus: No Atrophie Blanche: No Crepitus: No Cyanosis: No Excoriation: No Ecchymosis: No Induration: No Erythema: No Rash: No Hemosiderin Staining: No Scarring: No Mottled: No Pallor: No Moisture Rubor: No No Abnormalities Noted: No Dry / Scaly: No Temperature / Pain Maceration: Yes Temperature: No Abnormality Tenderness on Palpation: Yes Wound Preparation Ulcer Cleansing: Other: soap and water, Topical Anesthetic Applied: Other: lidocaine 4%, Electronic Signature(s) Signed: 11/27/2016 4:17:42 PM By: Gretta Cool, BSN, RN, CWS, Kim RN, BSN Entered By: Gretta Cool, BSN, RN, CWS, Kim on 11/27/2016 16:05:42 Adam Merritt ([id number]) -------------------------------------------------------------------------------- Wound Assessment Details Patient Name: Adam Merritt Date of Service: 11/27/2016 3:45 PM Medical Record Number: [id number] Patient Account Number: [id number] Date of Birth/Sex: 10-05-1945 (71 y.o. Male) Treating RN: Cornell Barman Primary Care  Gotham Raden: [name] Other Clinician: Referring Treyshawn Muldrew: [name] Treating Zada Haser/Extender: Melburn Hake, [name]: 8 Wound Status Wound Number: 3 Primary Lymphedema Etiology: Wound Location: Right Lower Leg Wound Healed - Epithelialized Wounding Event: Gradually Appeared Status: Date Acquired: 07/10/2016 Comorbid Anemia, Lymphedema, Chronic Weeks Of Treatment: 8 History: Obstructive Pulmonary Disease (COPD), Clustered Wound: No Sleep Apnea, Congestive Heart Failure, Coronary Artery Disease, Hypertension, Peripheral Venous Disease, Type II Diabetes, Osteoarthritis, Neuropathy, Received Chemotherapy Photos Wound Measurements Length: (cm) 0 % Reduction Width: (cm) 0 % Reduction Depth: (cm) 0 Epitheliali Area: (cm) 0 Tunneling: Volume: (cm) 0 Underminin in Area: 100% in Volume: 100% zation: Large (67-100%) No g: No Wound Description Classification: Partial Thickness Foul Odor A Diabetic Severity (Wagner): Grade 1 Slough/Fibr Wound Margin: Flat and Intact Exudate Amount: Large Exudate Type: Serous Exudate Color: amber fter Cleansing: No ino No Wound Bed Granulation Amount: Large (67-100%) Exposed Structure Granulation Quality: Red Fascia Exposed: No BREYTON, VANSCYOC ([id number]) Necrotic Amount: None Present (0%) Fat Layer (Subcutaneous Tissue) Exposed: No Tendon Exposed: No Muscle Exposed: No Joint Exposed: No Bone Exposed: No Limited to Skin Breakdown Periwound Skin Texture Texture Color No Abnormalities Noted: No No Abnormalities Noted: No Callus: No Atrophie Blanche: No Crepitus: No Cyanosis: No Excoriation: No Ecchymosis: No Induration: No Erythema: No Rash: No Hemosiderin Staining: No Scarring: No Mottled: No Pallor: No Moisture Rubor: No No Abnormalities Noted: No Dry / Scaly: No Temperature / Pain Maceration: No Temperature: No Abnormality Tenderness on Palpation: Yes Wound Preparation Ulcer Cleansing: Other:  soap and water, Topical Anesthetic Applied: Other: lidocaine 4%, Electronic Signature(s) Signed: 11/27/2016 4:17:42 PM By: Gretta Cool, BSN, RN, CWS, Kim RN, BSN Entered By: Gretta Cool, BSN, RN, CWS, Kim on 11/27/2016 16:06:10 [name]  Viona Gilmore ([id number]) -------------------------------------------------------------------------------- [place] Details Patient Name: JAXIN, FULFER Date of Service: 11/27/2016 3:45 PM Medical Record Number: [id number] Patient Account Number: [id number] Date of Birth/Sex: 12/11/1945 (71 y.o. Male) Treating RN: Cornell Barman Primary Care Toniette Devera: [name] Other Clinician: Referring Chasen Mendell: [name] Treating Bryannah Boston/Extender: Melburn Hake, [name]: 8 Vital Signs Time Taken: 15:55 Temperature (F): 97.7 Height (in): 65 Pulse (bpm): 76 Weight (lbs): 336 Respiratory Rate (breaths/min): 18 Body Mass Index (BMI): 55.9 Blood Pressure (mmHg): 106/63 Reference Range: 80 - 120 mg / dl Electronic Signature(s) Signed: 11/27/2016 4:17:42 PM By: Gretta Cool, BSN, RN, CWS, Kim RN, BSN Entered By: Gretta Cool, BSN, RN, CWS, Kim on 11/27/2016 15:55:55

## 2016-12-04 ENCOUNTER — Ambulatory Visit: Payer: Medicare Other | Admitting: Surgery

## 2016-12-04 ENCOUNTER — Encounter: Payer: Medicare Other | Attending: Surgery | Admitting: Surgery

## 2016-12-04 DIAGNOSIS — Z9049 Acquired absence of other specified parts of digestive tract: Secondary | ICD-10-CM | POA: Insufficient documentation

## 2016-12-04 DIAGNOSIS — Z6841 Body Mass Index (BMI) 40.0 and over, adult: Secondary | ICD-10-CM | POA: Insufficient documentation

## 2016-12-04 DIAGNOSIS — E11620 Type 2 diabetes mellitus with diabetic dermatitis: Secondary | ICD-10-CM | POA: Insufficient documentation

## 2016-12-04 DIAGNOSIS — G510 Bell's palsy: Secondary | ICD-10-CM | POA: Diagnosis not present

## 2016-12-04 DIAGNOSIS — I509 Heart failure, unspecified: Secondary | ICD-10-CM | POA: Insufficient documentation

## 2016-12-04 DIAGNOSIS — E785 Hyperlipidemia, unspecified: Secondary | ICD-10-CM | POA: Diagnosis not present

## 2016-12-04 DIAGNOSIS — E1151 Type 2 diabetes mellitus with diabetic peripheral angiopathy without gangrene: Secondary | ICD-10-CM | POA: Insufficient documentation

## 2016-12-04 DIAGNOSIS — Z8673 Personal history of transient ischemic attack (TIA), and cerebral infarction without residual deficits: Secondary | ICD-10-CM | POA: Diagnosis not present

## 2016-12-04 DIAGNOSIS — E1140 Type 2 diabetes mellitus with diabetic neuropathy, unspecified: Secondary | ICD-10-CM | POA: Diagnosis not present

## 2016-12-04 DIAGNOSIS — Z951 Presence of aortocoronary bypass graft: Secondary | ICD-10-CM | POA: Diagnosis not present

## 2016-12-04 DIAGNOSIS — I2510 Atherosclerotic heart disease of native coronary artery without angina pectoris: Secondary | ICD-10-CM | POA: Insufficient documentation

## 2016-12-04 DIAGNOSIS — G4730 Sleep apnea, unspecified: Secondary | ICD-10-CM | POA: Diagnosis not present

## 2016-12-04 DIAGNOSIS — I890 Lymphedema, not elsewhere classified: Secondary | ICD-10-CM | POA: Diagnosis not present

## 2016-12-04 DIAGNOSIS — I110 Hypertensive heart disease with heart failure: Secondary | ICD-10-CM | POA: Diagnosis not present

## 2016-12-04 DIAGNOSIS — L97221 Non-pressure chronic ulcer of left calf limited to breakdown of skin: Secondary | ICD-10-CM | POA: Diagnosis not present

## 2016-12-04 DIAGNOSIS — L97311 Non-pressure chronic ulcer of right ankle limited to breakdown of skin: Secondary | ICD-10-CM | POA: Diagnosis not present

## 2016-12-05 NOTE — Progress Notes (Signed)
Adam, Merritt ([id number]) Visit Report for 12/04/2016 Arrival Information Details Patient Name: Adam Merritt, Adam Merritt Date of Service: 12/04/2016 12:30 PM Medical Record Number: [id number] Patient Account Number: [id number] Date of Birth/Sex: [birth year] (71 y.o. Male) Treating RN: Montey Hora Primary Care Blossom Crume: [name] Other Clinician: Referring Nami Strawder: [name] Treating Shanaia Sievers/Extender: Frann Rider in Treatment: 9 Visit Information History Since Last Visit Added or deleted any medications: No Patient Arrived: [name] Any new allergies or adverse reactions: No Arrival Time: 12:26 Had a fall or experienced change in No Accompanied By: spouse activities of daily living that may affect Transfer Assistance: None risk of falls: Patient Identification Verified: Yes Signs or symptoms of abuse/neglect since last No Secondary Verification Process Yes visito Completed: Hospitalized since last visit: No Patient Requires Transmission- No Has Compression in Place as Prescribed: Yes Based Precautions: Pain Present Now: No Patient Has Alerts: Yes Patient Alerts: DMII ABI [place] BILATERAL >220 Electronic Signature(s) Signed: 12/04/2016 4:56:49 PM By: Montey Hora Entered By: Montey Hora on 12/04/2016 12:27:37 Adam Merritt ([id number]) -------------------------------------------------------------------------------- Clinic Level of Care Assessment Details Patient Name: Adam Merritt Date of Service: 12/04/2016 12:30 PM Medical Record Number: [id number] Patient Account Number: [id number] Date of Birth/Sex: 01/19/1946 (71 y.o. Male) Treating RN: Montey Hora Primary Care Samarah Hogle: [name] Other Clinician: Referring Jaxson Anglin: [name] Treating Bliss Behnke/Extender: Frann Rider in Treatment: 9 Clinic Level of Care Assessment Items TOOL 4 Quantity Score []  - Use when only an EandM is performed on FOLLOW-UP visit 0 ASSESSMENTS - Nursing Assessment /  Reassessment X - Reassessment of Co-morbidities (includes updates in patient status) 1 10 X - Reassessment of Adherence to Treatment Plan 1 5 ASSESSMENTS - Wound and Skin Assessment / Reassessment []  - Simple Wound Assessment / Reassessment - one wound 0 []  - Complex Wound Assessment / Reassessment - multiple wounds 0 X - Dermatologic / Skin Assessment (not related to wound area) 1 10 ASSESSMENTS - Focused Assessment X - Circumferential Edema Measurements - multi extremities 2 5 []  - Nutritional Assessment / Counseling / Intervention 0 X - Lower Extremity Assessment (monofilament, tuning fork, pulses) 1 5 []  - Peripheral Arterial Disease Assessment (using hand held doppler) 0 ASSESSMENTS - Ostomy and/or Continence Assessment and Care []  - Incontinence Assessment and Management 0 []  - Ostomy Care Assessment and Management (repouching, etc.) 0 PROCESS - Coordination of Care X - Simple Patient / Family Education for ongoing care 1 15 []  - Complex (extensive) Patient / Family Education for ongoing care 0 []  - Staff obtains Programmer, systems, Records, Test Results / Process Orders 0 []  - Staff telephones HHA, Nursing Homes / Clarify orders / etc 0 []  - Routine Transfer to another Facility (non-emergent condition) 0 AWAIS, COBARRUBIAS ([id number]) []  - Routine Hospital Admission (non-emergent condition) 0 []  - New Admissions / Biomedical engineer / Ordering NPWT, Apligraf, etc. 0 []  - Emergency Hospital Admission (emergent condition) 0 X - Simple Discharge Coordination 1 10 []  - Complex (extensive) Discharge Coordination 0 PROCESS - Special Needs []  - Pediatric / Minor Patient Management 0 []  - Isolation Patient Management 0 []  - Hearing / Language / Visual special needs 0 []  - Assessment of Community assistance (transportation, D/C planning, etc.) 0 []  - Additional assistance / Altered mentation 0 []  - Support Surface(s) Assessment (bed, cushion, seat, etc.) 0 INTERVENTIONS - Wound Cleansing /  Measurement []  - Simple Wound Cleansing - one wound 0 []  - Complex Wound Cleansing - multiple wounds 0 []  - Wound Imaging (photographs -  any number of wounds) 0 []  - Wound Tracing (instead of photographs) 0 []  - Simple Wound Measurement - one wound 0 []  - Complex Wound Measurement - multiple wounds 0 INTERVENTIONS - Wound Dressings []  - Small Wound Dressing one or multiple wounds 0 []  - Medium Wound Dressing one or multiple wounds 0 X - Large Wound Dressing one or multiple wounds 2 20 []  - Application of Medications - topical 0 []  - Application of Medications - injection 0 INTERVENTIONS - Miscellaneous []  - External ear exam 0 HEBERTO, STURDEVANT ([id number]) []  - Specimen Collection (cultures, biopsies, blood, body fluids, etc.) 0 []  - Specimen(s) / Culture(s) sent or taken to Lab for analysis 0 []  - Patient Transfer (multiple staff / Harrel Lemon Lift / Similar devices) 0 []  - Simple Staple / Suture removal (25 or less) 0 []  - Complex Staple / Suture removal (26 or more) 0 []  - Hypo / Hyperglycemic Management (close monitor of Blood Glucose) 0 []  - Ankle / Brachial Index (ABI) - do not check if billed separately 0 X - Vital Signs 1 5 Has the patient been seen at the hospital within the last three years: Yes Total Score: 110 Level Of Care: New/Established - Level 3 Electronic Signature(s) Signed: 12/04/2016 4:56:49 PM By: Montey Hora Entered By: Montey Hora on 12/04/2016 13:25:28 Adam Merritt ([id number]) -------------------------------------------------------------------------------- Encounter Discharge Information Details Patient Name: Adam Merritt Date of Service: 12/04/2016 12:30 PM Medical Record Number: [id number] Patient Account Number: [id number] Date of Birth/Sex: 1945/06/26 (71 y.o. Male) Treating RN: Montey Hora Primary Care Neisha Hinger: [name] Other Clinician: Referring Renelda Kilian: [name] Treating Jayden Kratochvil/Extender: Frann Rider in Treatment:  9 Encounter Discharge Information Items Discharge Pain Level: 0 Discharge Condition: Stable Ambulatory Status: Cane Discharge Destination: Home Transportation: Private Auto Accompanied By: spouse Schedule Follow-up Appointment: No Medication Reconciliation completed No and provided to Patient/Care Suzannah Bettes: Provided on Clinical Summary of Care: 12/04/2016 Form Type Recipient Paper Patient JT Electronic Signature(s) Signed: 12/04/2016 1:26:01 PM By: Montey Hora Previous Signature: 12/04/2016 1:11:11 PM Version By: Ruthine Dose Entered By: Montey Hora on 12/04/2016 13:26:01 Adam Merritt ([id number]) -------------------------------------------------------------------------------- General Visit Notes Details Patient Name: Adam Merritt Date of Service: 12/04/2016 12:30 PM Medical Record Number: [id number] Patient Account Number: [id number] Date of Birth/Sex: 06/27/1945 (71 y.o. Male) Treating RN: Montey Hora Primary Care Derrico Zhong: [name] Other Clinician: Referring Jaquanda Wickersham: [name] Treating Danyiel Crespin/Extender: Frann Rider in Treatment: 9 Notes kerlix/coban wrap applied to both lower legs Electronic Signature(s) Signed: 12/04/2016 1:26:49 PM By: Montey Hora Entered By: Montey Hora on 12/04/2016 13:26:49 Adam Merritt ([id number]) -------------------------------------------------------------------------------- Lower Extremity Assessment Details Patient Name: Adam Merritt Date of Service: 12/04/2016 12:30 PM Medical Record Number: [id number] Patient Account Number: [id number] Date of Birth/Sex: 1945/12/10 (71 y.o. Male) Treating RN: Montey Hora Primary Care Manroop Jakubowicz: [name] Other Clinician: Referring Leighanna Kirn: [name] Treating Givanni Staron/Extender: Frann Rider in Treatment: 9 Edema Assessment Assessed: [Left: No] [Right: No] Edema: [Left: Yes] [Right: Yes] Calf Left: Right: Point of Measurement: 34 cm From Medial Instep 59 cm  53 cm Ankle Left: Right: Point of Measurement: 12 cm From Medial Instep 36.5 cm 31.6 cm Vascular Assessment Pulses: Posterior Tibial Extremity colors, hair growth, and conditions: Extremity Color: [Left:Hyperpigmented] [Right:Hyperpigmented] Hair Growth on Extremity: [Left:No] [Right:No] Temperature of Extremity: [Left:Warm] [Right:Warm] Capillary Refill: [Left:< 3 seconds] [Right:< 3 seconds] Electronic Signature(s) Signed: 12/04/2016 4:56:49 PM By: Montey Hora Entered By: Montey Hora on 12/04/2016 12:35:09 Adam Merritt ([id number]) -------------------------------------------------------------------------------- Multi Wound  Chart Details Patient Name: JAYMESON, MENGEL Date of Service: 12/04/2016 12:30 PM Medical Record Number: [id number] Patient Account Number: [id number] Date of Birth/Sex: 1945-08-12 (71 y.o. Male) Treating RN: Montey Hora Primary Care Diyari Cherne: [name] Other Clinician: Referring Keniah Klemmer: [name] Treating Smera Guyette/Extender: Frann Rider in Treatment: 9 Vital Signs Height(in): 65 Pulse(bpm): 78 Weight(lbs): 336 Blood Pressure 144/72 (mmHg): Body Mass Index(BMI): 56 Temperature(F): 97.9 Respiratory Rate 18 (breaths/min): Wound Assessments Treatment Notes Electronic Signature(s) Signed: 12/04/2016 1:09:34 PM By: Christin Fudge MD, FACS Entered By: Christin Fudge on 12/04/2016 13:09:34 Adam Merritt ([id number]) -------------------------------------------------------------------------------- [place] Details Patient Name: Adam Merritt Date of Service: 12/04/2016 12:30 PM Medical Record Number: [id number] Patient Account Number: [id number] Date of Birth/Sex: [birth year] (71 y.o. Male) Treating RN: Montey Hora Primary Care Lorae Roig: [name] Other Clinician: Referring Kean Gautreau: [name] Treating Latasia Silberstein/Extender: Frann Rider in Treatment: 9 Active Inactive Electronic Signature(s) Signed:  12/04/2016 1:24:38 PM By: Montey Hora Entered By: Montey Hora on 12/04/2016 13:24:37 Adam Merritt ([id number]) -------------------------------------------------------------------------------- Non-Wound Condition Assessment Details Patient Name: Adam Merritt Date of Service: 12/04/2016 12:30 PM Medical Record Number: [id number] Patient Account Number: [id number] Date of Birth/Sex: 05/30/1946 (71 y.o. Male) Treating RN: Montey Hora Primary Care Vadis Slabach: [name] Other Clinician: Referring Rochel Privett: [name] Treating Marquitta Persichetti/Extender: Frann Rider in Treatment: 9 Non-Wound Condition: Condition: Lymphedema Location: Leg Side: Bilateral Photos Periwound Skin Texture Texture Color No Abnormalities Noted: No No Abnormalities Noted: No Induration: Yes Hemosiderin Staining: Yes Mottled: Yes Moisture No Abnormalities Noted: No Temperature / Pain Dry / Scaly: Yes Temperature: No Abnormality Electronic Signature(s) Signed: 12/04/2016 2:40:47 PM By: Montey Hora Entered By: Montey Hora on 12/04/2016 14:40:46 Adam Merritt ([id number]) -------------------------------------------------------------------------------- Pain Assessment Details Patient Name: Adam Merritt Date of Service: 12/04/2016 12:30 PM Medical Record Number: [id number] Patient Account Number: [id number] Date of Birth/Sex: 11-01-1945 (71 y.o. Male) Treating RN: Montey Hora Primary Care Radley Barto: [name] Other Clinician: Referring Shalandra Leu: [name] Treating Lakresha Stifter/Extender: Frann Rider in Treatment: 9 Active Problems Location of Pain Severity and Description of Pain Patient Has Paino No Site Locations Pain Management and Medication Current Pain Management: Notes Topical or injectable lidocaine is offered to patient for acute pain when surgical debridement is performed. If needed, Patient is instructed to use over the counter pain medication for the following 24-48  hours after debridement. Wound care MDs do not prescribed pain medications. Patient has chronic pain or uncontrolled pain. Patient has been instructed to make an appointment with their Primary Care Physician for pain management. Electronic Signature(s) Signed: 12/04/2016 4:56:49 PM By: Montey Hora Entered By: Montey Hora on 12/04/2016 12:27:45 Adam Merritt ([id number]) -------------------------------------------------------------------------------- Patient/Caregiver Education Details Patient Name: Adam Merritt Date of Service: 12/04/2016 12:30 PM Medical Record Number: [id number] Patient Account Number: [id number] Date of Birth/Gender: 07-20-1945 (71 y.o. Male) Treating RN: Montey Hora Primary Care Physician: [name] Other Clinician: Referring Physician: [name] Treating Physician/Extender: Frann Rider in Treatment: 9 Education Assessment Education Provided To: Patient and Caregiver Education Topics Provided Venous: Handouts: Other: keep apt with lymphedema clinic Methods: Explain/Verbal Responses: State content correctly Electronic Signature(s) Signed: 12/04/2016 4:56:49 PM By: Montey Hora Entered By: Montey Hora on 12/04/2016 13:26:25 Adam Merritt ([id number]) -------------------------------------------------------------------------------- [place] Details Patient Name: Adam Merritt Date of Service: 12/04/2016 12:30 PM Medical Record Number: [id number] Patient Account Number: [id number] Date of Birth/Sex: 06-Aug-1945 (71 y.o. Male) Treating RN: Montey Hora Primary Care Datron Brakebill: [name] Other Clinician: Referring Calysta Craigo: [name]  Treating Roiza Wiedel/Extender: Frann Rider in Treatment: 9 Vital Signs Time Taken: 12:27 Temperature (F): 97.9 Height (in): 65 Pulse (bpm): 78 Weight (lbs): 336 Respiratory Rate (breaths/min): 18 Body Mass Index (BMI): 55.9 Blood Pressure (mmHg): 144/72 Reference Range: 80 - 120 mg /  dl Electronic Signature(s) Signed: 12/04/2016 4:56:49 PM By: Montey Hora Entered By: Montey Hora on 12/04/2016 12:29:09

## 2016-12-05 NOTE — Progress Notes (Signed)
KASHUS, KARLEN ([id number]) Visit Report for 12/04/2016 Chief Complaint Document Details Patient Name: Adam Merritt, Adam Merritt Date of Service: 12/04/2016 12:30 PM Medical Record Number: [id number] Patient Account Number: [id number] Date of Birth/Sex: 1946-01-09 (71 y.o. Male) Treating RN: Montey Hora Primary Care Provider: [name] Other Clinician: Referring Provider: [name] Treating Provider/Extender: Frann Rider in Treatment: 9 Information Obtained from: Patient Chief Complaint the patient comes along with his wife for excessive swelling of both lower extremities left more than right and weeping of fluid Electronic Signature(s) Signed: 12/04/2016 1:09:40 PM By: Christin Fudge MD, FACS Entered By: Christin Fudge on 12/04/2016 13:09:40 Adam Merritt ([id number]) -------------------------------------------------------------------------------- HPI Details Patient Name: Adam Merritt Date of Service: 12/04/2016 12:30 PM Medical Record Number: [id number] Patient Account Number: [id number] Date of Birth/Sex: [birth year] (71 y.o. Male) Treating RN: Montey Hora Primary Care Provider: [name] Other Clinician: Referring Provider: [name] Treating Provider/Extender: Frann Rider in Treatment: 9 History of Present Illness Location: bilateral lower leg edema with some blisters recently and weeping of fluid left side more than right Quality: Patient has no wounds Severity: Patient states wound are getting better Duration: Patient has had the wound for > 3 months prior to seeking treatment at the wound center Timing: Patient has no pain Context: The wound appeared gradually over time Modifying Factors: Consults to this date include: he has been seen by his PCP and she sent him off to see [place]. Associated Signs and Symptoms: Patient reports having difficulty standing for long periods. HPI Description: 70 year old gentleman seen earlier at our clinic in June 2016 and again  in June 2017 is noncompliant with recommendations and does not follow-up regularly. 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 He had referred him to the lymphedema clinic and he was accepted but he did not follow-up with them. At that stage he had agreed to get a repeat arterial study done and he has seen Dr. Trula Adam Merritt in [place]. This time around he has been seen by his PCP Dr. Tiburcio Bash who referred him urgently to [place] for bilateral lower extremity cellulitis in spite of being on antibiotics -- he was placed on Keflex and 500 mg 3 times a day for 10 days. His comorbidities include coronary artery disease, diabetes mellitus, hypertension, Bell's palsy and chronic lymphedema. he was also recently admitted to the hospital on April 13 and kept overnight with the diagnosis of a TIA and a left ICA stenosis to follow-up with vascular surgery as an outpatient 10/05/2016 -- patient has been compliant this week and left his compression and dressing on for the entire week and had home health change it appropriately. 11/13/2016 -- his insurance will not cover compression stockings for lymphedema and hence we will have to send him to the lymphedema clinic as soon as we have finished with his healing. 11/20/2016 -- his lymphedema clinic appointment is still pending but other than that he has done very well and we are ready to transition him over. 11/27/16 patient's wounds appeared to be completely healed on evaluation today. He still has bilateral left edema and has been referred to the lymphedema clinic although we are still waiting to hear back from them on scheduling. Apparently she has to be discharged from home health services prior to  being able to establish with the lymphedema clinic. At this  point considering he is completely healed from the standpoint of wounds of the bilateral lower extremities I think it is appropriate to discharge him from home health and Adam Merritt, Adam Merritt. ([id number]) initiate weekly wraps on our end. There is no evidence of infection. ==== Old Notes: 71 year old gentleman who comes with bilateral lower limb edema for over 20 years and recent attack of blisters and redness for about 2 weeks. The patient says he has had bilateral lower limb edema since 1996. He has been noncompliant with wearing wraps in the past and even was given a boot to wear at night which she never tolerated and didn't wear it. He has a past medical history of sleep apnea, neuropathy with diabetes, hypertension, hyperlipidemia, coronary artery disease, varicose veins, carotid artery disease and is status post CABG and cholecystectomy. He was seen by the vascular surgeon on 09/29/2013 and Dr. Trula Adam Merritt opinion was:"I have reviewed his outside ultrasound which shows an ankle-brachial index of 0.72 on the left and 1.0 on the right.o This study was repeated at our office.o The ankle-brachial index on the right was 1.26 with triphasic waveforms.o On the left it was a 1.04 with biphasic waveforms.o The patient had a toe pressure of 121 on the right and 100 on the left. In summary, I would not recommend any further imaging , as I do not think his symptoms are related to arterial insufficiency, but rather secondary to diabetic neuropathy.o The patient still has room for increasing his Neurontin dose, which he states does help him.o I have scheduled him to come back in one year for a repeat arterial evaluation." in June 2016, a year ago, he has a venous duplex study to be done. He was also going to be accepted to the lymphedema clinic but I did not believe the patient went regularly for this. ==== Electronic Signature(s) Signed: 12/04/2016 1:09:46 PM By: Christin Fudge MD, FACS Entered By: Christin Fudge on 12/04/2016 13:09:46 Adam Merritt ([id number]) -------------------------------------------------------------------------------- Physical Exam Details Patient Name: Adam Merritt Date of Service: 12/04/2016 12:30 PM Medical Record Number: [id number] Patient Account Number: [id number] Date of Birth/Sex: 02-08-1946 (71 y.o. Male) Treating RN: Montey Hora Primary Care Provider: [name] Other Clinician: Referring Provider: [name] Treating Provider/Extender: Frann Rider in Treatment: 9 Constitutional . Pulse regular. Respirations normal and unlabored. Afebrile. . Eyes Nonicteric. Reactive to light. Ears, Nose, Mouth, and Throat Lips, teeth, and gums WNL.Marland Kitchen Moist mucosa without lesions. Neck supple and nontender. No palpable supraclavicular or cervical adenopathy. Normal sized without goiter. Respiratory WNL. No retractions.. Breath sounds WNL, No rubs, rales, rhonchi, or wheeze.. Cardiovascular Heart rhythm and rate regular, no murmur or gallop.. Pedal Pulses WNL. No clubbing, cyanosis or edema. Chest Breasts symmetical and no nipple discharge.. Breast tissue WNL, no masses, lumps, or tenderness.. Lymphatic No adneopathy. No adenopathy. No adenopathy. Musculoskeletal Adexa without tenderness or enlargement.. Digits and nails w/o clubbing, cyanosis, infection, petechiae, ischemia, or inflammatory conditions.. Integumentary (Hair, Skin) No suspicious lesions. No crepitus or fluctuance. No peri-wound warmth or erythema. No masses.Marland Kitchen Psychiatric Judgement and insight Intact.. No evidence of depression, anxiety, or agitation.. Notes his ulcers have all healed and he has significant stage II lymphedema Electronic Signature(s) Signed: 12/04/2016 1:10:06 PM By: Christin Fudge MD, FACS Entered By: Christin Fudge on 12/04/2016 13:10:05 Adam Merritt ([id number]) -------------------------------------------------------------------------------- Physician Orders  Details Patient Name: Adam Merritt Date of Service: 12/04/2016 12:30 PM Medical Record Number: [id number]  Patient Account Number: [id number] Date of Birth/Sex: 1946/02/23 (71 y.o. Male) Treating RN: Montey Hora Primary Care Provider: [name] Other Clinician: Referring Provider: [name] Treating Provider/Extender: Frann Rider in Treatment: 9 Verbal / Phone Orders: No Diagnosis Coding Edema Control o Kerlix and Coban - Bilateral Discharge From [facility] o Discharge from [facility] Signature(s) Signed: 12/04/2016 4:25:24 PM By: Christin Fudge MD, FACS Signed: 12/04/2016 4:56:49 PM By: Montey Hora Entered By: Montey Hora on 12/04/2016 12:56:01 Adam Merritt ([id number]) -------------------------------------------------------------------------------- Problem List Details Patient Name: Adam Merritt Date of Service: 12/04/2016 12:30 PM Medical Record Number: [id number] Patient Account Number: [id number] Date of Birth/Sex: [birth year] (71 y.o. 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 Male) Treating RN: Montey Hora Primary Care Provider: [name] Other Clinician: Referring Provider: [name] Treating Provider/Extender: Frann Rider in Treatment: 9 Subjective Chief Complaint Information obtained from Patient the patient comes along with his wife for excessive swelling of both lower extremities left more than right and weeping of fluid History of Present Illness (HPI) The following HPI elements were documented for the patient's wound: Location: bilateral lower leg edema with some blisters recently and weeping of fluid left side more than right Quality: Patient has no wounds Severity: Patient states wound are getting better Duration: Patient has had the wound for > 3 months prior to seeking treatment at the wound center Timing: Patient has no pain Context: The wound appeared gradually over time Modifying Factors: Consults to this date include: he has been seen by his PCP and she sent him off to see [place]. Associated Signs and Symptoms: Patient reports having difficulty standing for long periods. 71 year old gentleman seen earlier at our clinic in June 2016 and again in June 2017 is noncompliant with recommendations and does not follow-up regularly. 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 He had referred him to the lymphedema clinic and he was accepted but he did not follow-up with them. At that stage he had agreed to get a repeat arterial study done and he has seen  Dr. Trula Adam Merritt in [place]. This time around he has been seen by his PCP Dr. Tiburcio Bash who referred him urgently to [place] for bilateral lower extremity cellulitis in spite of being on antibiotics -- he was placed on Keflex and 500 mg 3 times a day for 10 days. His comorbidities include coronary artery disease, diabetes mellitus, hypertension, Bell's palsy and chronic lymphedema. he was also recently admitted to  the hospital on April 13 and kept overnight with the diagnosis of a TIA and a left ICA stenosis to follow-up with vascular surgery as an outpatient 10/05/2016 -- patient has been compliant this week and left his compression and dressing on for the entire week and had home health change it appropriately. 11/13/2016 -- his insurance will not cover compression stockings for lymphedema and hence we will have to send him to the lymphedema clinic as soon as we have finished with his healing. 11/20/2016 -- his lymphedema clinic appointment is still pending but other than that he has done very well Adam Merritt, Adam Merritt. ([id number]) and we are ready to transition him over. 11/27/16 patient's wounds appeared to be completely healed on evaluation today. He still has bilateral left edema and has been referred to the lymphedema clinic although we are still waiting to hear back from them on scheduling. Apparently she has to be discharged from home health services prior to being able to establish with the lymphedema clinic. At this point considering he is completely healed from the standpoint of wounds of the bilateral lower extremities I think it is appropriate to discharge him from home health and initiate weekly wraps on our end. There is no evidence of infection. ==== Old Notes: 71 year old gentleman who comes with bilateral lower limb edema for over 20 years and recent attack of blisters and redness for about 2 weeks. The patient says he has had bilateral lower limb edema since 1996. He has been  noncompliant with wearing wraps in the past and even was given a boot to wear at night which she never tolerated and didn't wear it. He has a past medical history of sleep apnea, neuropathy with diabetes, hypertension, hyperlipidemia, coronary artery disease, varicose veins, carotid artery disease and is status post CABG and cholecystectomy. He was seen by the vascular surgeon on 09/29/2013 and Dr. Trula Adam Merritt opinion was:"I have reviewed his outside ultrasound which shows an ankle-brachial index of 0.72 on the left and 1.0 on the right. This study was repeated at our office. The ankle-brachial index on the right was 1.26 with triphasic waveforms. On the left it was a 1.04 with biphasic waveforms. The patient had a toe pressure of 121 on the right and 100 on the left. In summary, I would not recommend any further imaging , as I do not think his symptoms are related to arterial insufficiency, but rather secondary to diabetic neuropathy. The patient still has room for increasing his Neurontin dose, which he states does help him. I have scheduled him to come back in one year for a repeat arterial evaluation." in June 2016, a year ago, he has a venous duplex study to be done. He was also going to be accepted to the lymphedema clinic but I did not believe the patient went regularly for this. ==== Objective Constitutional Pulse regular. Respirations normal and unlabored. Afebrile. Vitals Time Taken: 12:27 PM, Height: 65 in, Weight: 336 lbs, BMI: 55.9, Temperature: 97.9 F, Pulse: 78 bpm, Respiratory Rate: 18 breaths/min, Blood Pressure: 144/72 mmHg. Eyes Nonicteric. Reactive to light. Adam Merritt, Adam Merritt ([id number]) Ears, Nose, Mouth, and Throat Lips, teeth, and gums WNL.Marland Kitchen Moist mucosa without lesions. Neck supple and nontender. No palpable supraclavicular or cervical adenopathy. Normal sized without goiter. Respiratory WNL. No retractions.. Breath sounds WNL, No rubs, rales, rhonchi, or  wheeze.. Cardiovascular Heart rhythm and rate regular, no murmur or gallop.. Pedal Pulses WNL. No clubbing, cyanosis or edema. Chest Breasts symmetical and no nipple discharge.. Breast tissue  WNL, no masses, lumps, or tenderness.. Lymphatic No adneopathy. No adenopathy. No adenopathy. Musculoskeletal Adexa without tenderness or enlargement.. Digits and nails w/o clubbing, cyanosis, infection, petechiae, ischemia, or inflammatory conditions.Marland Kitchen Psychiatric Judgement and insight Intact.. No evidence of depression, anxiety, or agitation.. General Notes: his ulcers have all healed and he has significant stage II lymphedema Integumentary (Hair, Skin) No suspicious lesions. No crepitus or fluctuance. No peri-wound warmth or erythema. No masses.. Other Condition(s) Patient presents with Lymphedema located on the Bilateral Leg. The skin appearance exhibited: Dry/Scaly, Hemosiderin Staining, Induration, Mottled. Skin temperature was noted as No Abnormality. 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 foam and a light Kerlix and Coban dressing to both lower extremities. 2. refer him to the lymphedema clinic - Appointment on July 10 3. Good control of his diabetes mellitus 4. Elevation and exercise. 5. he is discharged from the wound care services and will be seen back only if needed Electronic Signature(s) Signed: 12/04/2016 4:27:06 PM By: Christin Fudge  MD, FACS Previous Signature: 12/04/2016 1:11:23 PM Version By: Christin Fudge MD, FACS Entered By: Christin Fudge on 12/04/2016 16:27:05 Adam Merritt ([id number]) -------------------------------------------------------------------------------- SuperBill Details Patient Name: Adam Merritt Date of Service: 12/04/2016 Medical Record Number: [id number] Patient Account Number: [id number] Date of Birth/Sex: 12/28/1945 (72 y.o. 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 Facility Procedures CPT4 Code: [id number] Description: 99213 - WOUND CARE VISIT-LEV 3 EST PT Modifier: Quantity: 1 Physician Procedures CPT4 Code Description: [id number] - WC PHYS LEVEL 3 - EST PT ICD-10 Description Diagnosis 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

## 2016-12-12 ENCOUNTER — Encounter: Payer: Self-pay | Admitting: Occupational Therapy

## 2016-12-12 ENCOUNTER — Ambulatory Visit: Payer: Medicare Other | Attending: Surgery | Admitting: Occupational Therapy

## 2016-12-12 DIAGNOSIS — I890 Lymphedema, not elsewhere classified: Secondary | ICD-10-CM | POA: Insufficient documentation

## 2016-12-12 NOTE — Therapy (Signed)
[place] MAIN [facility] SERVICES [address] [place], [place], [address] Phone: [phone]   Fax:  [phone]  Occupational Therapy Evaluation  Patient Details  Name: Adam Merritt MRN: [id number] Date of Birth: Jun 05, 1946 No Data Recorded  Encounter Date: 12/12/2016      OT End of Session - 12/12/16 1215    Visit Number 1   Number of Visits 36   Date for OT Re-Evaluation 03/12/17   OT Start Time 0905   OT Stop Time 1003   OT Time Calculation (min) 58 min   Activity Tolerance Patient tolerated treatment well;No increased pain   Behavior During Therapy WFL for tasks assessed/performed      Past Medical History:  Diagnosis Date  . Anemia   . CAD (coronary artery disease)   . Chest pain   . Coronary artery disease   . Diabetes mellitus without complication ([place])   . Esophageal reflux   . Herpes zoster without mention of complication   . Hyperlipidemia   . Hypertension   . Insomnia   . Lumbago   . Neuropathy in diabetes ([place])   . Obstructive chronic bronchitis without exacerbation ([place])   . Other malaise and fatigue   . Stroke ([place])   . Varicose veins     Past Surgical History:  Procedure Laterality Date  . CHOLECYSTECTOMY    . CORONARY ARTERY BYPASS GRAFT      There were no vitals filed for this visit.      Subjective Assessment - 12/12/16 1725    Subjective  Pt is referred for Occupational Therapy evaluation and treatment of BLE lymphedema by Sharolyn Douglas, MD. Pt was previously evaluated by this OT for the same diagnosis on 12/28/2014., but patient did not return for treatment.  Pt is accompanied by his spouse, Adam Merritt, again today. Pt was recently DC'd from the [facility] wound clinic after treatment for a vascular leg ulcer.    Patient is accompained by: Family member   Pertinent History DM, OSA, CAROTID artery disease, diabetic neuropathy, Hx TIA, recent Dopler negative for DVT, hx recurrent cellulitis, Obesity, CVI    Limitations difficulty walking, impaired functional mobility, difficulty bathing feet and legs, difficulty fitting lower body clothing and street shoes;  difficulty donning preferred shoes and socks;    Repetition Increases Symptoms   Patient Stated Goals Get my legs better so I dont have to worry about losing them all the time.    Currently in Pain? Yes   Pain Score 10-Worst pain ever   Pain Location Leg   Pain Orientation Right;Left   Pain Descriptors / Indicators Aching;Burning;Sore;Numbness;Stabbing;Cramping;Tender;Pins and needles;Tightness;Discomfort;Tingling;Pressure;Tiring;Sharp;Heaviness;Shooting   Pain Type Chronic pain   Pain Onset Other (comment)   Aggravating Factors  prolonged standing, walking and sitting, dependent positioning   Pain Relieving Factors elevation   Effect of Pain on Daily Activities see LIMITATIONS above                                 OT Long Term Goals - 12/12/16 1438      OT LONG TERM GOAL #1   Title Pt independent w/ lymphedema precautions and prevention principals and strategies to limit LE progression and infection risk using printed handout for reference to limit lymphedema progression and infection risk.   Baseline dependent   Time 1   Period Weeks   Status New     OT LONG TERM GOAL #  2   Title Lymphedema (LE) management/ self-care: Pt able to apply knee length, multi layered,  gradient compression wraps with maximum caregiver assistance using proper techniques within 2 weeks to achieve optimal limb volume reductions bilaterally.   Baseline dependent   Time 2   Period Weeks   Status New     OT LONG TERM GOAL #3   Title Lymphedema (LE) management/ self-care:  Pt to achieve at least 10% limb volume reduction  bilaterally below the knees during Intensive Phase CDT to limit progression, to reduce leg pain, and to improve ADLs performance, and to facilitate safe functional mobility and ambulation.   Baseline dependent   Time  12   Period Weeks   Status New     OT LONG TERM GOAL #4   Title Lymphedema (LE) management/ self-care:  Pt to tolerate daily compression wraps, compression garments and/ or HOS devices in keeping w/ prescribed wear regime within 1 week of issue date of each to progress and retain clinical and functional gains and to limit LE progression.   Baseline dependent   Time 12   Period Weeks     OT LONG TERM GOAL #5   Title Lymphedema (LE) management/ self-care:  During Management Phase CDT Pt to sustain current limb volumes within 5%, and all other clinical gains achieved during OT treatment with needed level of caregiver assistance to limit LE progression, infection risk and further functional decline.   Baseline dependent   Time 6   Period Months   Status New     Long Term Additional Goals   Additional Long Term Goals Yes     OT LONG TERM GOAL #6   Title Pt to remain wound and nfection free in BLE for 6 months to limit lymphedema progression , to limit non-heealing leg wounds , and to limit increased difficulty w/ functional ambulation and mobility needed for optimal ADLs performance   Baseline dependent   Time 6   Period Weeks   Status New               Plan - 12/12/16 1647    Clinical Impression Statement Pt presents fow/ severe, stage III, (welephantiasis) LLE lymphedema, and severe stage II, RLE lymphedema secondary to chronic venous insufficiency and obesity, LLE LE is significantly exacerbated due to L surgical scarring at medial knee across lymphatic "bottleneck". Onset ~ 20 years ago reportedly cooincides with vein harvest for heart surgery.  Episodes of venous ulcers and hx of recurrent cellulitis infections contribute to worsening of leg condition and progression of lymphedema over time . BLE leg swelling, associated soft tissue changes and pain limits Pt's ability to ambulate, to perform funtional transfers and bed mobility, to perform basic and instrumental ADLs, limits  ability to participate in productive, leisure and social activities, and contributes to falls risk and decreased balance. Without skilled Occupational Therapy for Complete Decongestive Therapy (CDT to limit chronic, progressive LE, Pt's condition is expected to worsen and further functional decline is expected.   Occupational Profile and client history currently impacting functional performance Retired , married gentleman enjoys attending grandson's baseball games , working on his computer, and being on the go.No compression garments used at present   Occupational performance deficits (Please refer to evaluation for details): ADL's;IADL's;Rest and Sleep;Work;Leisure;Social Participation   Rehab Potential Good   Current Impairments/barriers affecting progress: difficulty reaching distal legs and feet for skin infection, dressing, bathing, nail care and skin care. ZUnable to apply compression wraps himself. Will require  max caregiver assist for optimal outcome. Deep tissue induration may limit clinical response to CDT. Hx of wounds and  recurrent infection  Falls risk   OT Frequency 3x / week   OT Duration 12 weeks   OT Treatment/Interventions Self-care/ADL training;Therapeutic exercise;Patient/family education;Manual Therapy;Manual lymph drainage;Therapeutic exercises;DME and/or AE instruction;Compression bandaging;Other (comment)  skin care with low PH lotion   or castor oil during MLD   Plan Treat one leg at a time beginning w/ LLE. When Intensive Phase CDT is complete fit w/ custom, BLE compression knee highs ( consider ccl 3 Elvarex) and convoluted foam boots to facilitate improved lymphatic circulation and to decrease fibrosis formation during HOS.   Clinical Decision Making Several treatment options, min-mod task modification necessary   Consulted and Agree with Plan of Care Patient;Family member/caregiver      Patient will benefit from skilled therapeutic intervention in order to improve the  following deficits and impairments:  Decreased endurance, Decreased skin integrity, Decreased knowledge of precautions, Decreased scar mobility, Impaired perceived functional ability, Improper body mechanics, Decreased activity tolerance, Decreased knowledge of use of DME, Impaired flexibility, Decreased balance, Decreased mobility, Difficulty walking, Impaired sensation, Obesity, Decreased range of motion, Increased edema, Pain, Other (comment) (increased fall risk; increased infection risk; increased risk non-healing wounds)  Visit Diagnosis: Lymphedema, not elsewhere classified - Plan: Ot plan of care cert/re-cert      G-Codes - 88/41/66 09-04-1216    Functional Assessment Tool Used (Outpatient only) medical chart review,physical evaluation, interview, comparative limb volumetrics, observation   Functional Limitation Self care   Self Care Current Status (A6301) At least 80 percent but less than 100 percent impaired, limited or restricted   Self Care Goal Status (S0109) At least 20 percent but less than 40 percent impaired, limited or restricted      Problem List Patient Active Problem List   Diagnosis Date Noted  . CVA (cerebral vascular accident) ([place]) 09/15/2016  . CVA (cerebral infarction) 02/12/2015  . Peripheral vascular disease, unspecified ([place]) 09/29/2013    Andrey Spearman, MS, OTR/L, [facility] 12/12/16 5:35 PM  [place] MAIN [facility] SERVICES [address] [place], [place], [address] Phone: [phone]   Fax:  [phone]  Name: Adam Merritt MRN: [id number] Date of Birth: 07-20-1945

## 2017-01-01 ENCOUNTER — Encounter: Payer: Medicare Other | Admitting: Surgery

## 2017-01-01 DIAGNOSIS — E11620 Type 2 diabetes mellitus with diabetic dermatitis: Secondary | ICD-10-CM | POA: Diagnosis not present

## 2017-01-02 NOTE — Progress Notes (Addendum)
SCOTTY, WEIGELT ([id number]) Visit Report for 01/01/2017 Arrival Information Details Patient Name: Adam Merritt, Adam Merritt Date of Service: 01/01/2017 8:00 AM Medical Record Number: [id number] Patient Account Number: [id number] Date of Birth/Sex: 03/05/1946 (71 y.o. Male) Treating RN: Ahmed Prima Primary Care [place]: [name] Other Clinician: Referring Hiya Point: [name] Treating Latorie Montesano/Extender: Frann Rider in Treatment: 13 Visit Information History Since Last Visit All ordered tests and consults were completed: No Patient Arrived: [name] Added or deleted any medications: No Arrival Time: 08:11 Any new allergies or adverse reactions: No Accompanied By: wife Had a fall or experienced change in No Transfer Assistance: None activities of daily living that may affect Patient Identification Verified: Yes risk of falls: Secondary Verification Process Yes Signs or symptoms of abuse/neglect since last No Completed: visito Patient Requires Transmission- No Hospitalized since last visit: No Based Precautions: Pain Present Now: No Patient Has Alerts: Yes Patient Alerts: DMII ABI [place] BILATERAL >220 Electronic Signature(s) Signed: 01/01/2017 5:05:30 PM By: Alric Quan Entered By: Alric Quan on 01/01/2017 08:13:03 Adam Merritt ([id number]) -------------------------------------------------------------------------------- Clinic Level of Care Assessment Details Patient Name: Adam Merritt Date of Service: 01/01/2017 8:00 AM Medical Record Number: [id number] Patient Account Number: [id number] Date of Birth/Sex: 10-13-1945 (71 y.o. Male) Treating RN: Ahmed Prima Primary Care Jordain Radin: [name] Other Clinician: Referring Tyler Cubit: [name] Treating Deshara Rossi/Extender: Frann Rider in Treatment: 13 Clinic Level of Care Assessment Items TOOL 4 Quantity Score X - Use when only an EandM is performed on FOLLOW-UP visit 1 0 ASSESSMENTS - Nursing  Assessment / Reassessment X - Reassessment of Co-morbidities (includes updates in patient status) 1 10 X - Reassessment of Adherence to Treatment Plan 1 5 ASSESSMENTS - Wound and Skin Assessment / Reassessment [] - Simple Wound Assessment / Reassessment - one wound 0 X - Complex Wound Assessment / Reassessment - multiple wounds 2 5 [] - Dermatologic / Skin Assessment (not related to wound area) 0 ASSESSMENTS - Focused Assessment [] - Circumferential Edema Measurements - multi extremities 0 [] - Nutritional Assessment / Counseling / Intervention 0 [] - Lower Extremity Assessment (monofilament, tuning fork, pulses) 0 [] - Peripheral Arterial Disease Assessment (using hand held doppler) 0 ASSESSMENTS - Ostomy and/or Continence Assessment and Care [] - Incontinence Assessment and Management 0 [] - Ostomy Care Assessment and Management (repouching, etc.) 0 PROCESS - Coordination of Care [] - Simple Patient / Family Education for ongoing care 0 X - Complex (extensive) Patient / Family Education for ongoing care 1 20 X - Staff obtains Programmer, systems, Records, Test Results / Process Orders 1 10 [] - Staff telephones HHA, Nursing Homes / Clarify orders / etc 0 [] - Routine Transfer to another Facility (non-emergent condition) 0 KARA, MIERZEJEWSKI ([id number]) [] - Routine Hospital Admission (non-emergent condition) 0 [] - New Admissions / Biomedical engineer / Ordering NPWT, Apligraf, etc. 0 [] - Emergency Hospital Admission (emergent condition) 0 X - Simple Discharge Coordination 1 10 [] - Complex (extensive) Discharge Coordination 0 PROCESS - Special Needs [] - Pediatric / Minor Patient Management 0 [] - Isolation Patient Management 0 [] - Hearing / Language / Visual special needs 0 [] - Assessment of Community assistance (transportation, D/C planning, etc.) 0 [] - Additional assistance / Altered mentation 0 [] - Support Surface(s) Assessment (bed, cushion, seat, etc.) 0 INTERVENTIONS - Wound  Cleansing / Measurement [] - Simple Wound Cleansing - one wound 0 X - Complex Wound Cleansing - multiple wounds 2 5 [] - Wound Imaging (  photographs - any number of wounds) 0 X - Wound Tracing (instead of photographs) 1 5 [] - Simple Wound Measurement - one wound 0 X - Complex Wound Measurement - multiple wounds 2 5 INTERVENTIONS - Wound Dressings [] - Small Wound Dressing one or multiple wounds 0 [] - Medium Wound Dressing one or multiple wounds 0 X - Large Wound Dressing one or multiple wounds 2 20 X - Application of Medications - topical 1 5 [] - Application of Medications - injection 0 INTERVENTIONS - Miscellaneous [] - External ear exam 0 TYESON, TANIMOTO ([id number]) [] - Specimen Collection (cultures, biopsies, blood, body fluids, etc.) 0 [] - Specimen(s) / Culture(s) sent or taken to Lab for analysis 0 [] - Patient Transfer (multiple staff / Harrel Lemon Lift / Similar devices) 0 [] - Simple Staple / Suture removal (25 or less) 0 [] - Complex Staple / Suture removal (26 or more) 0 [] - Hypo / Hyperglycemic Management (close monitor of Blood Glucose) 0 [] - Ankle / Brachial Index (ABI) - do not check if billed separately 0 X - Vital Signs 1 5 Has the patient been seen at the hospital within the last three years: Yes Total Score: 140 Level Of Care: New/Established - Level 4 Electronic Signature(s) Signed: 01/01/2017 5:05:30 PM By: Alric Quan Entered By: Alric Quan on 01/01/2017 10:10:57 Adam Merritt ([id number]) -------------------------------------------------------------------------------- Encounter Discharge Information Details Patient Name: Adam Merritt Date of Service: 01/01/2017 8:00 AM Medical Record Number: [id number] Patient Account Number: [id number] Date of Birth/Sex: 01-29-1946 (71 y.o. Male) Treating RN: Ahmed Prima Primary Care : [name] Other Clinician: Referring : [name] Treating /Extender: Frann Rider in  Treatment: 13 Encounter Discharge Information Items Discharge Pain Level: 0 Discharge Condition: Stable Ambulatory Status: Cane Discharge Destination: Home Transportation: Private Auto Accompanied By: wife Schedule Follow-up Appointment: Yes Medication Reconciliation completed No and provided to Patient/Care : Provided on Clinical Summary of Care: 01/01/2017 Form Type Recipient Paper Patient JT Electronic Signature(s) Signed: 01/01/2017 5:05:30 PM By: Alric Quan Previous Signature: 01/01/2017 9:10:45 AM Version By: Ruthine Dose Entered By: Alric Quan on 01/01/2017 09:18:36 Adam Merritt ([id number]) -------------------------------------------------------------------------------- Lower Extremity Assessment Details Patient Name: Adam Merritt Date of Service: 01/01/2017 8:00 AM Medical Record Number: [id number] Patient Account Number: [id number] Date of Birth/Sex: [birth year] (71 y.o. Male) Treating RN: Ahmed Prima Primary Care : [name] Other Clinician: Referring : [name] Treating /Extender: Frann Rider in Treatment: 13 Edema Assessment Assessed: [Left: No] [Right: No] E[Left: dema] [Right: :] Calf Left: Right: Point of Measurement: 34 cm From Medial Instep 57 cm 51 cm Ankle Left: Right: Point of Measurement: 12 cm From Medial Instep 36.5 cm 30.5 cm Vascular Assessment Pulses: Dorsalis Pedis Palpable: [Left:Yes] [Right:Yes] Posterior Tibial Extremity colors, hair growth, and conditions: Extremity Color: [Left:Hyperpigmented] [Right:Hyperpigmented] Temperature of Extremity: [Left:Warm] [Right:Warm] Capillary Refill: [Left:< 3 seconds] [Right:< 3 seconds] Toe Nail Assessment Left: Right: Thick: Yes Yes Discolored: Yes Yes Deformed: No No Improper Length and Hygiene: Yes Yes Electronic Signature(s) Signed: 01/01/2017 5:05:30 PM By: Alric Quan Entered By: Alric Quan on 01/01/2017  08:29:46 Adam Merritt ([id number]) -------------------------------------------------------------------------------- Multi Wound Chart Details Patient Name: Adam Merritt Date of Service: 01/01/2017 8:00 AM Medical Record Number: [id number] Patient Account Number: [id number] Date of Birth/Sex: July 05, 1945 (71 y.o. Male) Treating RN: Ahmed Prima Primary Care : [name] Other Clinician: Referring : [name] Treating /Extender: Frann Rider in Treatment: 13 Vital Signs Height(in): 65 Pulse(bpm): 74 Weight(lbs): 336  Blood Pressure 130/71 (mmHg): Body Mass Index(BMI): 56 Temperature(F): 98.1 Respiratory Rate 18 (breaths/min): Photos: [4:No Photos] [5:No Photos] [N/A:N/A] Wound Location: [4:Left, Medial Lower Leg] [5:Left Lower Leg - Midline, Proximal] [N/A:N/A] Wounding Event: [4:Gradually Appeared] [5:Gradually Appeared] [N/A:N/A] Primary Etiology: [4:Lymphedema] [5:Lymphedema] [N/A:N/A] Comorbid History: [4:Anemia, Lymphedema, Chronic Obstructive Pulmonary Disease (COPD), Sleep Apnea, Congestive Heart Failure, Coronary Artery Disease, Hypertension, Peripheral Venous Disease, Type II Diabetes, Osteoarthritis, Neuropathy, Received  Chemotherapy] [5:Anemia, Lymphedema, Chronic Obstructive Pulmonary Disease (COPD), Sleep Apnea, Congestive Heart Failure, Coronary Artery Disease, Hypertension, Peripheral Venous Disease, Type II Diabetes, Osteoarthritis, Neuropathy, Received  Chemotherapy] [N/A:N/A] Date Acquired: [4:12/25/2016] [5:12/25/2016] [N/A:N/A] Weeks of Treatment: [4:0] [5:0] [N/A:N/A] Wound Status: [4:Open] [5:Open] [N/A:N/A] Measurements L x W x D 1.6x0.8x0.2 [5:0.2x0.2x0.1] [N/A:N/A] (cm) Area (cm) : [4:1.005] [5:0.031] [N/A:N/A] Volume (cm) : [4:0.201] [5:0.003] [N/A:N/A] Classification: [4:Partial Thickness] [5:Partial Thickness] [N/A:N/A] HBO Classification: [4:Grade 1] [5:Grade 1] [N/A:N/A] Exudate Amount: [4:Large] [5:Large]  [N/A:N/A] Exudate Type: [4:Serous] [5:Serous] [N/A:N/A] Exudate Color: [4:amber] [5:amber] [N/A:N/A] Wound Margin: [4:Distinct, outline attached] [5:Distinct, outline attached] [N/A:N/A] Granulation Amount: [4:None Present (0%)] [5:None Present (0%)] [N/A:N/A] Necrotic Amount: Large (67-100%) Large (67-100%) N/A Epithelialization: None None N/A Periwound Skin Texture: No Abnormalities Noted No Abnormalities Noted N/A Periwound Skin No Abnormalities Noted No Abnormalities Noted N/A Moisture: Periwound Skin Color: No Abnormalities Noted No Abnormalities Noted N/A Temperature: No Abnormality No Abnormality N/A Tenderness on Yes Yes N/A Palpation: Wound Preparation: Ulcer Cleansing: Ulcer Cleansing: N/A Rinsed/Irrigated with Rinsed/Irrigated with Saline Saline Topical Anesthetic Topical Anesthetic Applied: Other: lidocaine Applied: Other: lidocaine 4% 4% Treatment Notes Electronic Signature(s) Signed: 01/01/2017 9:04:27 AM By: Christin Fudge MD, FACS Entered By: Christin Fudge on 01/01/2017 09:04:27 Adam Merritt ([id number]) -------------------------------------------------------------------------------- [place] Details Patient Name: Adam Merritt Date of Service: 01/01/2017 8:00 AM Medical Record Number: [id number] Patient Account Number: [id number] Date of Birth/Sex: 1945-11-23 (71 y.o. Male) Treating RN: Ahmed Prima Primary Care Daqwan Dougal: [name] Other Clinician: Referring Ai Sonnenfeld: [name] Treating Mills Mitton/Extender: Frann Rider in Treatment: 72 Active Inactive ` Abuse / Safety / Falls / Self Care Management Nursing Diagnoses: Impaired physical mobility Potential for falls Goals: Patient will remain injury free Target Resolution Date Initiated: 09/28/2016 Date Inactivated: 12/04/2016 Date: 12/08/2016 Goal Status: Met Patient will remain injury free related to falls Date Initiated: 01/01/2017 Target Resolution Date: 03/10/2017 Goal  Status: Active Interventions: Assess fall risk on admission and as needed Assess impairment of mobility on admission and as needed per policy Notes: ` Orientation to the Wound Care Program Nursing Diagnoses: Knowledge deficit related to the wound healing center program Goals: Patient/caregiver will verbalize understanding of the [place] Program Date Initiated: 09/28/2016 Target Resolution Date: 05/05/2017 Goal Status: Active Interventions: Provide education on orientation to the wound center Notes: HOANG, PETTINGILL ([id number]) ` Wound/Skin Impairment Nursing Diagnoses: Impaired tissue integrity Knowledge deficit related to ulceration/compromised skin integrity Goals: Ulcer/skin breakdown will have a volume reduction of 80% by week 12 Date Initiated: 09/28/2016 Target Resolution Date: 04/14/2017 Goal Status: Active Interventions: Assess patient/caregiver ability to perform ulcer/skin care regimen upon admission and as needed Assess ulceration(s) every visit Notes: Electronic Signature(s) Signed: 01/01/2017 5:05:30 PM By: Alric Quan Entered By: Alric Quan on 01/01/2017 08:37:15 Adam Merritt ([id number]) -------------------------------------------------------------------------------- Pain Assessment Details Patient Name: Adam Merritt Date of Service: 01/01/2017 8:00 AM Medical Record Number: [id number] Patient Account Number: [id number] Date of Birth/Sex: Dec 24, 1945 (71 y.o. Male) Treating RN: Ahmed Prima Primary Care Hester Forget: [name] Other Clinician: Referring Ajay Strubel: [name] Treating Kalsey Lull/Extender: Con Memos,  [name] [name]: 13 Active Problems Location of Pain Severity and Description of Pain Patient Has Paino No Site Locations With Dressing Change: No Pain Management and Medication Current Pain Management: Electronic Signature(s) Signed: 01/01/2017 5:05:30 PM By: Alric Quan Entered By: Alric Quan on  01/01/2017 08:13:10 Adam Merritt ([id number]) -------------------------------------------------------------------------------- Patient/Caregiver Education Details Patient Name: Adam Merritt Date of Service: 01/01/2017 8:00 AM Medical Record Number: [id number] Patient Account Number: [id number] Date of Birth/Gender: 11/24/1945 (71 y.o. Male) Treating RN: Ahmed Prima Primary Care Physician: [name] Other Clinician: Referring Physician: [name] Treating Physician/Extender: Frann Rider in Treatment: 13 Education Assessment Education Provided To: Patient and Caregiver Education Topics Provided Welcome To [facility] [place]: Handouts: Welcome To [facility] Methods: Explain/Verbal Responses: State content correctly Wound/Skin Impairment: Handouts: Other: change dressing as ordered Methods: Demonstration, Explain/Verbal Responses: State content correctly Electronic Signature(s) Signed: 01/01/2017 5:05:30 PM By: Alric Quan Entered By: Alric Quan on 01/01/2017 08:38:13 Adam Merritt ([id number]) -------------------------------------------------------------------------------- Wound Assessment Details Patient Name: Adam Merritt Date of Service: 01/01/2017 8:00 AM Medical Record Number: [id number] Patient Account Number: [id number] Date of Birth/Sex: 1946-05-30 (71 y.o. Male) Treating RN: Ahmed Prima Primary Care : [name] Other Clinician: Referring : [name] Treating /Extender: Frann Rider in Treatment: 13 Wound Status Wound Number: 4 Primary Lymphedema Etiology: Wound Location: Left, Medial Lower Leg Wound Open Wounding Event: Gradually Appeared Status: Date Acquired: 12/25/2016 Comorbid Anemia, Lymphedema, Chronic Weeks Of Treatment: 0 History: Obstructive Pulmonary Disease (COPD), Clustered Wound: No Sleep Apnea, Congestive Heart Failure, Coronary Artery Disease,  Hypertension, Peripheral Venous Disease, Type II Diabetes, Osteoarthritis, Neuropathy, Received Chemotherapy Wound Measurements Length: (cm) 1.6 Width: (cm) 0.8 Depth: (cm) 0.2 Area: (cm) 1.005 Volume: (cm) 0.201 % Reduction in Area: % Reduction in Volume: Epithelialization: None Tunneling: No Undermining: No Wound Description Classification: Partial Thickness Foul O Diabetic Severity (Wagner): Grade 1 Slough Wound Margin: Distinct, outline attached Exudate Amount: Large Exudate Type: Serous Exudate Color: amber dor After Cleansing: No /Fibrino Yes Wound Bed Granulation Amount: None Present (0%) Necrotic Amount: Large (67-100%) Necrotic Quality: Adherent Slough Periwound Skin Texture Texture Color No Abnormalities Noted: No No Abnormalities Noted: No Moisture Temperature / Pain No Abnormalities Noted: No Temperature: No Abnormality Tenderness on Palpation: Yes HAREL, REPETTO ([id number]) Wound Preparation Ulcer Cleansing: Rinsed/Irrigated with Saline Topical Anesthetic Applied: Other: lidocaine 4%, Electronic Signature(s) Signed: 01/01/2017 5:05:30 PM By: Alric Quan Entered By: Alric Quan on 01/01/2017 08:25:35 Adam Merritt ([id number]) -------------------------------------------------------------------------------- Wound Assessment Details Patient Name: Adam Merritt Date of Service: 01/01/2017 8:00 AM Medical Record Number: [id number] Patient Account Number: [id number] Date of Birth/Sex: 1945-06-30 (71 y.o. Male) Treating RN: Ahmed Prima Primary Care : [name] Other Clinician: Referring : [name] Treating /Extender: Frann Rider in Treatment: 13 Wound Status Wound Number: 5 Primary Lymphedema Etiology: Wound Location: Left Lower Leg - Midline, Proximal Wound Open Status: Wounding Event: Gradually Appeared Comorbid Anemia, Lymphedema, Chronic Date Acquired: 12/25/2016 History: Obstructive  Pulmonary Disease (COPD), Weeks Of Treatment: 0 Sleep Apnea, Congestive Heart Failure, Clustered Wound: No Coronary Artery Disease, Hypertension, Peripheral Venous Disease, Type II Diabetes, Osteoarthritis, Neuropathy, Received Chemotherapy Wound Measurements Length: (cm) 0.2 Width: (cm) 0.2 Depth: (cm) 0.1 Area: (cm) 0.031 Volume: (cm) 0.003 % Reduction in Area: % Reduction in Volume: Epithelialization: None Tunneling: No Undermining: No Wound Description Classification: Partial Thickness Foul O Diabetic Severity (Wagner): Grade 1 Slough Wound Margin: Distinct, outline attached Exudate Amount: Large Exudate Type: Serous Exudate Color:  amber dor After Cleansing: No /Fibrino Yes Wound Bed Granulation Amount: None Present (0%) Necrotic Amount: Large (67-100%) Necrotic Quality: Adherent Slough Periwound Skin Texture Texture Color No Abnormalities Noted: No No Abnormalities Noted: No Moisture Temperature / Pain No Abnormalities Noted: No Temperature: No Abnormality Tenderness on Palpation: Yes DOYCE, SALING ([id number]) Wound Preparation Ulcer Cleansing: Rinsed/Irrigated with Saline Topical Anesthetic Applied: Other: lidocaine 4%, Treatment Notes Wound #5 (Left, Proximal, Midline Lower Leg) 1. Cleansed with: Clean wound with Normal Saline 2. Anesthetic Topical Lidocaine 4% cream to wound bed prior to debridement 3. Peri-wound Care: Moisturizing lotion 4. Dressing Applied: Aquacel Ag 5. Secondary Dressing Applied ABD Pad 7. Secured with Tape Notes kerlix, coban, unna to Engineer, production) Signed: 01/01/2017 5:05:30 PM By: Alric Quan Entered By: Alric Quan on 01/01/2017 08:29:36 Adam Merritt ([id number]) -------------------------------------------------------------------------------- Wound Assessment Details Patient Name: Adam Merritt Date of Service: 01/01/2017 8:00 AM Medical Record Number: [id number] Patient Account  Number: [id number] Date of Birth/Sex: [birth year] (71 y.o. Male) Treating RN: Ahmed Prima Primary Care Gwen Edler: [name] Other Clinician: Referring Iyania Denne: [name] Treating Keyron Pokorski/Extender: Frann Rider in Treatment: 13 Wound Status Wound Number: 6 Primary Diabetic Wound/Ulcer of the Lower Etiology: Extremity Wound Location: Right Lower Leg - Medial Wound Open Wounding Event: Gradually Appeared Status: Date Acquired: 12/25/2016 Comorbid Anemia, Lymphedema, Chronic Weeks Of Treatment: 0 History: Obstructive Pulmonary Disease (COPD), Clustered Wound: No Sleep Apnea, Congestive Heart Failure, Coronary Artery Disease, Hypertension, Peripheral Venous Disease, Type II Diabetes, Osteoarthritis, Neuropathy, Received Chemotherapy Wound Measurements Length: (cm) 0.1 Width: (cm) 0.1 Depth: (cm) 0.1 Area: (cm) 0.008 Volume: (cm) 0.001 % Reduction in Area: % Reduction in Volume: Epithelialization: None Tunneling: No Undermining: No Wound Description Classification: Grade 1 Foul Odor Aft Wound Margin: Flat and Intact Slough/Fibrin Exudate Amount: Large Exudate Type: Serous Exudate Color: amber er Cleansing: No o Yes Wound Bed Granulation Amount: None Present (0%) Necrotic Amount: Large (67-100%) Necrotic Quality: Adherent Slough Periwound Skin Texture Texture Color No Abnormalities Noted: No No Abnormalities Noted: No Moisture Temperature / Pain No Abnormalities Noted: No Temperature: No Abnormality Tenderness on Palpation: Yes Wound Preparation ABOU, STERKEL ([id number]) Ulcer Cleansing: Rinsed/Irrigated with Saline Topical Anesthetic Applied: Other: lidocaine 4%, Treatment Notes Wound #6 (Right, Medial Lower Leg) 1. Cleansed with: Clean wound with Normal Saline 2. Anesthetic Topical Lidocaine 4% cream to wound bed prior to debridement 4. Dressing Applied: Aquacel Ag 5. Secondary Dressing Applied ABD Pad 7. Secured  with Tape Notes kerlix, coban, unna to Engineer, production) Signed: 01/01/2017 5:05:30 PM By: Alric Quan Entered By: Alric Quan on 01/01/2017 17:00:17 Adam Merritt ([id number]) -------------------------------------------------------------------------------- [place] Details Patient Name: Adam Merritt Date of Service: 01/01/2017 8:00 AM Medical Record Number: [id number] Patient Account Number: [id number] Date of Birth/Sex: 1946/02/28 (71 y.o. Male) Treating RN: Ahmed Prima Primary Care [place] Hair: [name] Other Clinician: Referring Taviana Westergren: [name] Treating Nickisha Hum/Extender: Frann Rider in Treatment: 13 Vital Signs Time Taken: 08:17 Temperature (F): 98.1 Height (in): 65 Pulse (bpm): 74 Weight (lbs): 336 Respiratory Rate (breaths/min): 18 Body Mass Index (BMI): 55.9 Blood Pressure (mmHg): 130/71 Reference Range: 80 - 120 mg / dl Electronic Signature(s) Signed: 01/01/2017 5:05:30 PM By: Alric Quan Entered By: Alric Quan on 01/01/2017 08:18:43

## 2017-01-04 NOTE — Progress Notes (Addendum)
Adam Merritt, Adam Merritt ([id number]) Visit Report for 01/01/2017 Chief Complaint Document Details Patient Name: Adam Merritt, Adam Merritt Date of Service: 01/01/2017 8:00 AM Medical Record Number: [id number] Patient Account Number: [id number] Date of Birth/Sex: 09/01/1945 (71 y.o. Male) Treating RN: Ahmed Prima Primary Care Provider: [name] Other Clinician: Referring Provider: [name] Treating Provider/Extender: Frann Rider in Treatment: 13 Information Obtained from: Patient Chief Complaint the patient comes along with his wife for excessive swelling of both lower extremities left more than right and weeping of fluid Electronic Signature(s) Signed: 01/01/2017 9:04:33 AM By: Christin Fudge MD, FACS Entered By: Christin Fudge on 01/01/2017 09:04:33 Adam Merritt ([id number]) -------------------------------------------------------------------------------- HPI Details Patient Name: Adam Merritt Date of Service: 01/01/2017 8:00 AM Medical Record Number: [id number] Patient Account Number: [id number] Date of Birth/Sex: July 01, 1945 (71 y.o. Male) Treating RN: Ahmed Prima Primary Care Provider: [name] Other Clinician: Referring Provider: [name] Treating Provider/Extender: Frann Rider in Treatment: 13 History of Present Illness Location: bilateral lower leg edema with some blisters recently and weeping of fluid left side more than right Quality: Patient has no wounds Severity: Patient states wound are getting better Duration: Patient has had the wound for > 3 months prior to seeking treatment at the wound center Timing: Patient has no pain Context: The wound appeared gradually over time Modifying Factors: Consults to this date include: he has been seen by his PCP and she sent him off to see [place]. Associated Signs and Symptoms: Patient reports having difficulty standing for long periods. HPI Description: 71 year old gentleman seen earlier at our clinic in June 2016 and  again in June 2017 is noncompliant with recommendations and does not follow-up regularly. 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 He had referred him to the lymphedema clinic and he was accepted but he did not follow-up with them. At that stage he had agreed to get a repeat arterial study done and he has seen Dr. Trula Slade in [place]. This time around he has been seen by his PCP Dr. Tiburcio Bash who referred him urgently to [place] for bilateral lower extremity cellulitis in spite of being on antibiotics -- he was placed on Keflex and 500 mg 3 times a day for 10 days. His comorbidities include coronary artery disease, diabetes mellitus, hypertension, Bell's palsy and chronic lymphedema. he was also recently admitted to the hospital on April 13 and kept overnight with the diagnosis of a TIA and a left ICA stenosis to follow-up with vascular surgery as an outpatient 10/05/2016 -- patient has been compliant this week and left his compression and dressing on for the entire week and had home health change it appropriately. 11/13/2016 -- his insurance will not cover compression stockings for lymphedema and hence we will have to send him to the lymphedema clinic as soon as we have finished with his healing. 11/20/2016 -- his lymphedema clinic appointment is still pending but other than that he has done very well and we are ready to transition him over. 11/27/16 patient's wounds appeared to be completely healed on evaluation today. He still has bilateral left edema and has been referred to the lymphedema clinic although we are still waiting to hear back from them on scheduling. Apparently she has to be discharged from home health services prior to  being able to establish with the lymphedema clinic. At  this point considering he is completely healed from the standpoint of wounds of the bilateral lower extremities I think it is appropriate to discharge him from home health and Adam Merritt, Adam Merritt. ([id number]) initiate weekly wraps on our end. There is no evidence of infection. 01/01/2017 -- the patient was recently discharged on 12/04/2016 and asked to follow-up in the lymphedema clinic. However they seem to be some issues with his going to the lymphedema clinic and then referring him to dermatology for an opinion regarding his skin. Ultimately the patient did not have a follow-up and has now redeveloped wheezing from the right lower extremity and ulceration on the left lower extremity. The patient and his wife are rather helpless and do not seem to be very compliant with their care. ==== Old Notes: 71 year old gentleman who comes with bilateral lower limb edema for over 20 years and recent attack of blisters and redness for about 2 weeks. The patient says he has had bilateral lower limb edema since 1996. He has been noncompliant with wearing wraps in the past and even was given a boot to wear at night which she never tolerated and didn't wear it. He has a past medical history of sleep apnea, neuropathy with diabetes, hypertension, hyperlipidemia, coronary artery disease, varicose veins, carotid artery disease and is status post CABG and cholecystectomy. He was seen by the vascular surgeon on 09/29/2013 and Dr. Trula Slade opinion was:"I have reviewed his outside ultrasound which shows an ankle-brachial index of 0.72 on the left and 1.0 on the right.o This study was repeated at our office.o The ankle-brachial index on the right was 1.26 with triphasic waveforms.o On the left it was a 1.04 with biphasic waveforms.o The patient had a toe pressure of 121 on the right and 100 on the left. In summary, I would not recommend any further imaging , as I do not think his symptoms are related to arterial  insufficiency, but rather secondary to diabetic neuropathy.o The patient still has room for increasing his Neurontin dose, which he states does help him.o I have scheduled him to come back in one year for a repeat arterial evaluation." in June 2016, a year ago, he has a venous duplex study to be done. He was also going to be accepted to the lymphedema clinic but I did not believe the patient went regularly for this. ==== Electronic Signature(s) Signed: 01/01/2017 9:05:43 AM By: Christin Fudge MD, FACS Entered By: Christin Fudge on 01/01/2017 09:05:42 Adam Merritt ([id number]) -------------------------------------------------------------------------------- Physical Exam Details Patient Name: Adam Merritt Date of Service: 01/01/2017 8:00 AM Medical Record Number: [id number] Patient Account Number: [id number] Date of Birth/Sex: Jan 25, 1946 (71 y.o. Male) Treating RN: Ahmed Prima Primary Care Provider: [name] Other Clinician: Referring Provider: [name] Treating Provider/Extender: Frann Rider in Treatment: 13 Constitutional . Pulse regular. Respirations normal and unlabored. Afebrile. . Eyes Nonicteric. Reactive to light. Ears, Nose, Mouth, and Throat Lips, teeth, and gums WNL.Marland Kitchen Moist mucosa without lesions. Neck supple and nontender. No palpable supraclavicular or cervical adenopathy. Normal sized without goiter. Respiratory WNL. No retractions.. Cardiovascular Pedal Pulses WNL. No clubbing, cyanosis or edema. Lymphatic No adneopathy. No adenopathy. No adenopathy. Musculoskeletal Adexa without tenderness or enlargement.. Digits and nails w/o clubbing, cyanosis, infection, petechiae, ischemia, or inflammatory conditions.. Integumentary (Hair, Skin) No suspicious lesions. No crepitus or fluctuance. No peri-wound warmth or erythema. No masses.Marland Kitchen Psychiatric Judgement and insight Intact.. No evidence of depression, anxiety, or agitation.. Notes  the lymphedema  has increased and the left lower extremity is much larger than the right. On the right lower extremity he's got some fluid draining from his calf medially but there are no open ulcerations. On the left lower extremity he has an open ulceration on the lower anterior calf and there is some oozing from the posterior calf. No sharp debridement was required today. Electronic Signature(s) Signed: 01/01/2017 9:06:36 AM By: Christin Fudge MD, FACS Entered By: Christin Fudge on 01/01/2017 09:06:35 Adam Merritt ([id number]) -------------------------------------------------------------------------------- Physician Orders Details Patient Name: Adam Merritt Date of Service: 01/01/2017 8:00 AM Medical Record Number: [id number] Patient Account Number: [id number] Date of Birth/Sex: 02/13/1946 (71 y.o. Male) Treating RN: Ahmed Prima Primary Care Provider: [name] Other Clinician: Referring Provider: [name] Treating Provider/Extender: Frann Rider in Treatment: 2 Verbal / Phone Orders: Yes ClinicianCarolyne Fiscal, Debi Read Back and Verified: Yes Diagnosis Coding Wound Cleansing Wound #4 Left,Distal,Medial Lower Leg o Clean wound with Normal Saline. o May shower with protection. Wound #6 Right,Medial Lower Leg o Clean wound with Normal Saline. o May shower with protection. Wound #5 Left,Proximal,Midline Lower Leg o Clean wound with Normal Saline. o May shower with protection. Anesthetic Wound #4 Left,Distal,Medial Lower Leg o Topical Lidocaine 4% cream applied to wound bed prior to debridement Wound #5 Left,Proximal,Midline Lower Leg o Topical Lidocaine 4% cream applied to wound bed prior to debridement Wound #6 Right,Medial Lower Leg o Topical Lidocaine 4% cream applied to wound bed prior to debridement Skin Barriers/Peri-Wound Care Wound #4 Left,Distal,Medial Lower Leg o Moisturizing lotion Wound #6 Right,Medial Lower Leg o Moisturizing  lotion Wound #5 Left,Proximal,Midline Lower Leg o Moisturizing lotion Primary Wound Dressing Wound #4 Left,Distal,Medial Lower Leg o Aquacel Ag Adam Merritt, Adam Merritt ([id number]) Wound #6 Right,Medial Lower Leg o Aquacel Ag Wound #5 Left,Proximal,Midline Lower Leg o Aquacel Ag Secondary Dressing Wound #4 Left,Distal,Medial Lower Leg o ABD pad Wound #6 Right,Medial Lower Leg o ABD pad Wound #5 Left,Proximal,Midline Lower Leg o ABD pad Dressing Change Frequency Wound #4 Left,Distal,Medial Lower Leg o Change dressing every week Wound #5 Left,Proximal,Midline Lower Leg o Change dressing every week Wound #6 Right,Medial Lower Leg o Change dressing every week Follow-up Appointments Wound #4 Left,Distal,Medial Lower Leg o Return Appointment in 1 week. Wound #5 Left,Proximal,Midline Lower Leg o Return Appointment in 1 week. Wound #6 Right,Medial Lower Leg o Return Appointment in 1 week. Edema Control Wound #4 Left,Distal,Medial Lower Leg o Kerlix and Coban - Bilateral - unna to anchor Wound #6 Right,Medial Lower Leg o Kerlix and Coban - Bilateral - unna to anchor Wound #5 Left,Proximal,Midline Lower Leg o Kerlix and Coban - Bilateral - unna to anchor Adam Merritt, Adam Merritt ([id number]) Additional Orders / Instructions Wound #4 Left,Distal,Medial Lower Leg o Increase protein intake. Wound #6 Right,Medial Lower Leg o Increase protein intake. Wound #5 Left,Proximal,Midline Lower Leg o Increase protein intake. Electronic Signature(s) Signed: 01/01/2017 5:05:30 PM By: Alric Quan Signed: 01/02/2017 5:18:49 PM By: Christin Fudge MD, FACS Previous Signature: 01/01/2017 4:12:07 PM Version By: Christin Fudge MD, FACS Entered By: Alric Quan on 01/01/2017 17:01:14 Adam Merritt ([id number]) -------------------------------------------------------------------------------- Problem List Details Patient Name: Adam Merritt, Adam Merritt Date of Service: 01/01/2017  8:00 AM Medical Record Number: [id number] Patient Account Number: [id number] Date of Birth/Sex: [birth year] (71 y.o. 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 Male) Treating RN: Ahmed Prima Primary Care Provider: [name] Other Clinician: Referring Provider: [name] Treating Provider/Extender: Frann Rider in Treatment: 13 Subjective Chief Complaint Information obtained from Patient the patient comes along with his wife for excessive swelling of both lower extremities left more than right and weeping of fluid History of Present Illness (HPI) The following HPI elements were documented for the patient's wound: Location: bilateral lower leg edema with some blisters recently and weeping of fluid left side more than right Quality: Patient has no wounds Severity: Patient states wound are getting better Duration:  Patient has had the wound for > 3 months prior to seeking treatment at the wound center Timing: Patient has no pain Context: The wound appeared gradually over time Modifying Factors: Consults to this date include: he has been seen by his PCP and she sent him off to see [place]. Associated Signs and Symptoms: Patient reports having difficulty standing for long periods. 71 year old gentleman seen earlier at our clinic in June 2016 and again in June 2017 is noncompliant with recommendations and does not follow-up regularly. 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 He had referred him to the lymphedema clinic and he was accepted but he did not follow-up with them. At that stage he had agreed to get a repeat arterial study done and he has seen Dr. Trula Slade in [place]. This time around he has been seen by his PCP Dr. Tiburcio Bash who referred him urgently to [place] for bilateral lower extremity cellulitis in spite of being on antibiotics -- he was placed on Keflex and 500 mg 3 times a day for 10 days. His comorbidities include coronary artery disease, diabetes mellitus, hypertension, Bell's palsy and chronic lymphedema. he was also recently admitted to the hospital on April 13 and kept overnight with the diagnosis of a TIA and a left ICA stenosis to follow-up with vascular surgery as an outpatient 10/05/2016 -- patient has been compliant this week and left his compression and dressing on for the entire week and had home health change it appropriately. 11/13/2016 -- his insurance will not cover compression stockings for lymphedema and hence we will have to send him to the lymphedema clinic as soon as we have finished with his healing. 11/20/2016 -- his lymphedema clinic appointment is still pending but  other than that he has done very well Adam Merritt, Adam Merritt. ([id number]) and we are ready to transition him over. 11/27/16 patient's wounds appeared to be completely healed on evaluation today. He still has bilateral left edema and has been referred to the lymphedema clinic although we are still waiting to hear back from them on scheduling. Apparently she  has to be discharged from home health services prior to being able to establish with the lymphedema clinic. At this point considering he is completely healed from the standpoint of wounds of the bilateral lower extremities I think it is appropriate to discharge him from home health and initiate weekly wraps on our end. There is no evidence of infection. 01/01/2017 -- the patient was recently discharged on 12/04/2016 and asked to follow-up in the lymphedema clinic. However they seem to be some issues with his going to the lymphedema clinic and then referring him to dermatology for an opinion regarding his skin. Ultimately the patient did not have a follow-up and has now redeveloped wheezing from the right lower extremity and ulceration on the left lower extremity. The patient and his wife are rather helpless and do not seem to be very compliant with their care. ==== Old Notes: 71 year old gentleman who comes with bilateral lower limb edema for over 20 years and recent attack of blisters and redness for about 2 weeks. The patient says he has had bilateral lower limb edema since 1996. He has been noncompliant with wearing wraps in the past and even was given a boot to wear at night which she never tolerated and didn't wear it. He has a past medical history of sleep apnea, neuropathy with diabetes, hypertension, hyperlipidemia, coronary artery disease, varicose veins, carotid artery disease and is status post CABG and cholecystectomy. He was seen by the vascular surgeon on 09/29/2013 and Dr. Trula Slade opinion was:"I have reviewed his outside ultrasound  which shows an ankle-brachial index of 0.72 on the left and 1.0 on the right. This study was repeated at our office. The ankle-brachial index on the right was 1.26 with triphasic waveforms. On the left it was a 1.04 with biphasic waveforms. The patient had a toe pressure of 121 on the right and 100 on the left. In summary, I would not recommend any further imaging , as I do not think his symptoms are related to arterial insufficiency, but rather secondary to diabetic neuropathy. The patient still has room for increasing his Neurontin dose, which he states does help him. I have scheduled him to come back in one year for a repeat arterial evaluation." in June 2016, a year ago, he has a venous duplex study to be done. He was also going to be accepted to the lymphedema clinic but I did not believe the patient went regularly for this. ==== Adam Merritt, Adam Merritt. ([id number]) Objective Constitutional Pulse regular. Respirations normal and unlabored. Afebrile. Vitals Time Taken: 8:17 AM, Height: 65 in, Weight: 336 lbs, BMI: 55.9, Temperature: 98.1 F, Pulse: 74 bpm, Respiratory Rate: 18 breaths/min, Blood Pressure: 130/71 mmHg. Eyes Nonicteric. Reactive to light. Ears, Nose, Mouth, and Throat Lips, teeth, and gums WNL.Marland Kitchen Moist mucosa without lesions. Neck supple and nontender. No palpable supraclavicular or cervical adenopathy. Normal sized without goiter. Respiratory WNL. No retractions.. Cardiovascular Pedal Pulses WNL. No clubbing, cyanosis or edema. Lymphatic No adneopathy. No adenopathy. No adenopathy. Musculoskeletal Adexa without tenderness or enlargement.. Digits and nails w/o clubbing, cyanosis, infection, petechiae, ischemia, or inflammatory conditions.Marland Kitchen Psychiatric Judgement and insight Intact.. No evidence of depression, anxiety, or agitation.. General Notes: the lymphedema has increased and the left lower extremity is much larger than the right. On the right lower extremity he's got  some fluid draining from his calf medially but there are no open ulcerations. On the left lower extremity he has an open ulceration on the lower anterior calf and there is some oozing  from the posterior calf. No sharp debridement was required today. Integumentary (Hair, Skin) No suspicious lesions. No crepitus or fluctuance. No peri-wound warmth or erythema. No masses.. Wound #4 status is Open. Original cause of wound was Gradually Appeared. The wound is located on the Left,Distal,Medial Lower Leg. The wound measures 1.6cm length x 0.8cm width x 0.2cm depth; 1.005cm^2 area and 0.201cm^3 volume. There is no tunneling or undermining noted. There is a large amount of serous drainage noted. The wound margin is distinct with the outline attached to the wound base. There is no granulation within the wound bed. There is a large (67-100%) amount of necrotic tissue within the wound bed including Adherent Slough. Periwound temperature was noted as No Abnormality. The periwound has LOUI, [place]. ([id number]) tenderness on palpation. Wound #5 status is Open. Original cause of wound was Gradually Appeared. The wound is located on the Left,Proximal,Midline Lower Leg. The wound measures 0.2cm length x 0.2cm width x 0.1cm depth; 0.031cm^2 area and 0.003cm^3 volume. There is no tunneling or undermining noted. There is a large amount of serous drainage noted. The wound margin is distinct with the outline attached to the wound base. There is no granulation within the wound bed. There is a large (67-100%) amount of necrotic tissue within the wound bed including Adherent Slough. Periwound temperature was noted as No Abnormality. The periwound has tenderness on palpation. Wound #6 status is Open. Original cause of wound was Gradually Appeared. The wound is located on the Right,Medial Lower Leg. The wound measures 0.1cm length x 0.1cm width x 0.1cm depth; 0.008cm^2 area and 0.001cm^3 volume. There is no tunneling or  undermining noted. There is a large amount of serous drainage noted. The wound margin is flat and intact. There is no granulation within the wound bed. There is a large (67-100%) amount of necrotic tissue within the wound bed including Adherent Slough. Periwound temperature was noted as No Abnormality. The periwound has tenderness on palpation. 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 May shower with protection. Wound #6 Right,Medial Lower Leg: Clean wound with Normal Saline. May shower with protection. Wound #5 Left,Proximal,Midline Lower Leg: Clean wound with Normal Saline. May shower with protection. Anesthetic: Wound #4 Left,Distal,Medial Lower Leg: Adam Merritt, Adam Merritt ([id number]) Topical Lidocaine 4% cream applied to wound bed prior to debridement Wound #5 Left,Proximal,Midline Lower Leg: Topical Lidocaine 4% cream applied to wound bed prior to debridement Wound #6 Right,Medial Lower Leg: Topical Lidocaine 4% cream applied to wound bed prior to debridement Skin Barriers/Peri-Wound Care: Wound #4 Left,Distal,Medial Lower Leg: Moisturizing lotion Wound #6 Right,Medial Lower Leg: Moisturizing lotion Wound #5 Left,Proximal,Midline Lower Leg: Moisturizing lotion Primary Wound Dressing: Wound #4 Left,Distal,Medial Lower Leg: Aquacel Ag Wound #6 Right,Medial Lower Leg: Aquacel Ag Wound #5 Left,Proximal,Midline Lower Leg: Aquacel Ag Secondary Dressing: Wound #4 Left,Distal,Medial Lower Leg: ABD pad Wound #6 Right,Medial Lower Leg: ABD pad Wound #5 Left,Proximal,Midline Lower  Leg: ABD pad Dressing Change Frequency: Wound #4 Left,Distal,Medial Lower Leg: Change dressing every week Wound #5 Left,Proximal,Midline Lower Leg: Change dressing every week Wound #6 Right,Medial Lower Leg: Change dressing every week Follow-up Appointments: Wound #4 Left,Distal,Medial Lower Leg: Return Appointment in 1  week. Wound #5 Left,Proximal,Midline Lower Leg: Return Appointment in 1 week. Wound #6 Right,Medial Lower Leg: Return Appointment in 1 week. Edema Control: Wound #4 Left,Distal,Medial Lower Leg: Kerlix and Coban - Bilateral - unna to anchor Wound #6 Right,Medial Lower Leg: Kerlix and Coban - Bilateral - unna to anchor Wound #5 Left,Proximal,Midline Lower Leg: Kerlix and Coban - Bilateral - unna to anchor Additional Orders / Instructions: Wound #4 Left,Distal,Medial Lower Leg: Increase protein intake. Wound #6 Right,Medial Lower Leg: Adam Merritt, Adam Merritt. ([id number]) Increase protein intake. Wound #5 Left,Proximal,Midline Lower Leg: Increase protein intake. the patient and his wife are rather helpless and have not been very compliant with their lymphedema clinic follow-up. We will give them a call and talk to the lymphedema clinic to find out what issues have arisen in the last month. In the meanwhile he will need treatment for his ulceration and I have recommended: 1. Silver alginate and a light Kerlix and Coban dressing to both lower extremities. 2. elevation and exercise have been again reiterated 3. Good control of his diabetes mellitus 4. follow-up with the lymphedema clinic as soon as possible Electronic Signature(s) Signed: 01/02/2017 5:20:16 PM By: Christin Fudge MD, FACS Previous Signature: 01/01/2017 4:14:08 PM Version By: Christin Fudge MD, FACS Previous Signature: 01/01/2017 9:08:48 AM Version By: Christin Fudge MD, FACS Entered By: Christin Fudge on 01/02/2017 17:20:16 Adam Merritt  ([id number]) -------------------------------------------------------------------------------- SuperBill Details Patient Name: Adam Merritt Date of Service: 01/01/2017 Medical Record Number: [id number] Patient Account Number: [id number] Date of Birth/Sex: [birth year] (71 y.o. 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 Facility Procedures CPT4 Code: [id number] Description: 99214 - WOUND CARE VISIT-LEV 4 EST PT Modifier: Quantity: 1 Physician Procedures CPT4 Code Description: [id number] - WC PHYS LEVEL 3 - EST PT ICD-10 Description Diagnosis 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

## 2017-01-08 ENCOUNTER — Encounter: Payer: Medicare Other | Attending: Physician Assistant | Admitting: Physician Assistant

## 2017-01-08 DIAGNOSIS — E1151 Type 2 diabetes mellitus with diabetic peripheral angiopathy without gangrene: Secondary | ICD-10-CM | POA: Insufficient documentation

## 2017-01-08 DIAGNOSIS — L97221 Non-pressure chronic ulcer of left calf limited to breakdown of skin: Secondary | ICD-10-CM | POA: Insufficient documentation

## 2017-01-08 DIAGNOSIS — I110 Hypertensive heart disease with heart failure: Secondary | ICD-10-CM | POA: Diagnosis not present

## 2017-01-08 DIAGNOSIS — G510 Bell's palsy: Secondary | ICD-10-CM | POA: Diagnosis not present

## 2017-01-08 DIAGNOSIS — E785 Hyperlipidemia, unspecified: Secondary | ICD-10-CM | POA: Diagnosis not present

## 2017-01-08 DIAGNOSIS — E1140 Type 2 diabetes mellitus with diabetic neuropathy, unspecified: Secondary | ICD-10-CM | POA: Diagnosis not present

## 2017-01-08 DIAGNOSIS — Z8673 Personal history of transient ischemic attack (TIA), and cerebral infarction without residual deficits: Secondary | ICD-10-CM | POA: Insufficient documentation

## 2017-01-08 DIAGNOSIS — G4730 Sleep apnea, unspecified: Secondary | ICD-10-CM | POA: Insufficient documentation

## 2017-01-08 DIAGNOSIS — Z6841 Body Mass Index (BMI) 40.0 and over, adult: Secondary | ICD-10-CM | POA: Diagnosis not present

## 2017-01-08 DIAGNOSIS — I890 Lymphedema, not elsewhere classified: Secondary | ICD-10-CM | POA: Diagnosis not present

## 2017-01-08 DIAGNOSIS — Z9049 Acquired absence of other specified parts of digestive tract: Secondary | ICD-10-CM | POA: Diagnosis not present

## 2017-01-08 DIAGNOSIS — L97311 Non-pressure chronic ulcer of right ankle limited to breakdown of skin: Secondary | ICD-10-CM | POA: Diagnosis not present

## 2017-01-08 DIAGNOSIS — E11620 Type 2 diabetes mellitus with diabetic dermatitis: Secondary | ICD-10-CM | POA: Diagnosis not present

## 2017-01-08 DIAGNOSIS — I509 Heart failure, unspecified: Secondary | ICD-10-CM | POA: Diagnosis not present

## 2017-01-08 DIAGNOSIS — I2510 Atherosclerotic heart disease of native coronary artery without angina pectoris: Secondary | ICD-10-CM | POA: Insufficient documentation

## 2017-01-08 DIAGNOSIS — Z951 Presence of aortocoronary bypass graft: Secondary | ICD-10-CM | POA: Diagnosis not present

## 2017-01-09 NOTE — Progress Notes (Signed)
Adam Merritt, Adam Merritt ([id number]) Visit Report for 01/08/2017 Chief Complaint Document Details Patient Name: Adam Merritt, Adam Merritt Date of Service: 01/08/2017 1:30 PM Medical Record Number: [id number] Patient Account Number: [id number] Date of Birth/Sex: May 08, 1946 (71 y.o. Male) Treating RN: Montey Hora Primary Care Provider: [name] Other Clinician: Referring Provider: [name] Treating Provider/Extender: Melburn Hake, Joquan Lotz [name]: 14 Information Obtained from: Patient Chief Complaint the patient comes along with his wife for excessive swelling of both lower extremities left more than right and weeping of fluid Electronic Signature(s) Signed: 01/08/2017 4:13:08 PM By: Worthy Keeler PA-C Entered By: Worthy Keeler on 01/08/2017 13:41:58 Adam Merritt ([id number]) -------------------------------------------------------------------------------- HPI Details Patient Name: Adam Merritt Date of Service: 01/08/2017 1:30 PM Medical Record Number: [id number] Patient Account Number: [id number] Date of Birth/Sex: [birth year] (71 y.o. Male) Treating RN: Montey Hora Primary Care Provider: [name] Other Clinician: Referring Provider: [name] Treating Provider/Extender: Melburn Hake, Tahjay Binion [name]: 14 History of Present Illness Location: bilateral lower leg edema with some blisters recently and weeping of fluid left side more than right Severity: Patient states wound are getting better Duration: Patient has had the wound for > 3 months prior to seeking treatment at the wound center Context: The wound appeared gradually over time Modifying Factors: Consults to this date include: he has been seen by his PCP and she sent him off to see [place]. Associated Signs and Symptoms: Patient reports having difficulty standing for long periods. HPI Description: 71 year old gentleman seen earlier at our clinic in June 2016 and again in June 2017 is noncompliant with recommendations and  does not follow-up regularly. 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 He had referred him to the lymphedema clinic and he was accepted but he did not follow-up with them. At that stage he had agreed to get a repeat arterial study done and he has seen Dr. Trula Slade in [place]. This time around he has been seen by his PCP Dr. Tiburcio Bash who referred him urgently to [place] for bilateral lower extremity cellulitis in spite of being on antibiotics -- he was placed on Keflex and 500 mg 3 times a day for 10 days. His comorbidities include coronary artery disease, diabetes mellitus, hypertension, Bell's palsy and chronic lymphedema. he was also recently admitted to the hospital on April 13 and kept overnight with the diagnosis of a TIA and a left ICA stenosis to follow-up with vascular surgery as an outpatient 10/05/2016 -- patient has been compliant this week and left his compression and dressing on for the entire week and had home health change it appropriately. 11/13/2016 -- his insurance will not cover compression stockings for lymphedema and hence we will have to send him to the lymphedema clinic as soon as we have finished with his healing. 11/20/2016 -- his lymphedema clinic appointment is still pending but other than that he has done very well and we are ready to transition him over. 11/27/16 patient's wounds appeared to be completely healed on evaluation today. He still has bilateral left edema and has been referred to the lymphedema clinic although we are still waiting to hear back from them on scheduling. Apparently she has to be discharged from home health services prior to being able to establish with the lymphedema  clinic. At this point considering he is completely healed from the  standpoint of wounds of the bilateral lower extremities I think it is appropriate to discharge him from home health and initiate weekly wraps on our end. There is no evidence of infection. JA, [place] ([id number]) 01/01/2017 -- the patient was recently discharged on 12/04/2016 and asked to follow-up in the lymphedema clinic. However they seem to be some issues with his going to the lymphedema clinic and then referring him to dermatology for an opinion regarding his skin. Ultimately the patient did not have a follow-up and has now redeveloped wheezing from the right lower extremity and ulceration on the left lower extremity. The patient and his wife are rather helpless and do not seem to be very compliant with their care. 01/08/17 on evaluation today patient has just a single lower extremity wounds noted. Fortunately he is having no significant pain, no bowe or bladder dysfunction, no nausea and no vomiting. There's nothing to indicate a systemic worsening infection. ==== Old Notes: 71 year old gentleman who comes with bilateral lower limb edema for over 20 years and recent attack of blisters and redness for about 2 weeks. The patient says he has had bilateral lower limb edema since 1996. He has been noncompliant with wearing wraps in the past and even was given a boot to wear at night which she never tolerated and didn't wear it. He has a past medical history of sleep apnea, neuropathy with diabetes, hypertension, hyperlipidemia, coronary artery disease, varicose veins, carotid artery disease and is status post CABG and cholecystectomy. He was seen by the vascular surgeon on 09/29/2013 and Dr. Trula Slade opinion was:"I have reviewed his outside ultrasound which shows an ankle-brachial index of 0.72 on the left and 1.0 on the right.o This study was repeated at our office.o The ankle-brachial index on the right was 1.26 with triphasic waveforms.o On the left it was a 1.04 with biphasic  waveforms.o The patient had a toe pressure of 121 on the right and 100 on the left. In summary, I would not recommend any further imaging , as I do not think his symptoms are related to arterial insufficiency, but rather secondary to diabetic neuropathy.o The patient still has room for increasing his Neurontin dose, which he states does help him.o I have scheduled him to come back in one year for a repeat arterial evaluation." in June 2016, a year ago, he has a venous duplex study to be done. He was also going to be accepted to the lymphedema clinic but I did not believe the patient went regularly for this. ==== Electronic Signature(s) Signed: 01/08/2017 4:13:08 PM By: Worthy Keeler PA-C Entered By: Worthy Keeler on 01/08/2017 14:29:49 Adam Merritt ([id number]) -------------------------------------------------------------------------------- Physical Exam Details Patient Name: Adam Merritt Date of Service: 01/08/2017 1:30 PM Medical Record Number: [id number] Patient Account Number: [id number] Date of Birth/Sex: 09-Oct-1945 (71 y.o. Male) Treating RN: Montey Hora Primary Care Provider: [name] Other Clinician: Referring Provider: [name] Treating Provider/Extender: Melburn Hake, Jenaveve Fenstermaker [name]: 14 Constitutional Obese and well-hydrated in no acute distress. Respiratory normal breathing without difficulty. clear to auscultation bilaterally. Cardiovascular regular rate and rhythm with normal S1, S2. 2+ pitting edema of the bilateral lower extremities, Left worse than right.. Psychiatric this patient is able to make decisions and demonstrates good insight into disease process. Alert and Oriented x 3. pleasant and cooperative. Notes Patient's wound has no surrounding erythema and no purulent discharge. He has an excellent granular bed  noted at this point. Electronic Signature(s) Signed: 01/08/2017 4:13:08 PM By: Worthy Keeler PA-C Entered By: Worthy Keeler on  01/08/2017 14:30:49 Adam Merritt ([id number]) -------------------------------------------------------------------------------- Physician Orders Details Patient Name: Adam Merritt Date of Service: 01/08/2017 1:30 PM Medical Record Number: [id number] Patient Account Number: [id number] Date of Birth/Sex: 08/07/1945 (71 y.o. 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 o May shower with protection. Anesthetic Wound #4 Left,Distal,Medial Lower Leg o Topical Lidocaine 4% cream applied to wound bed prior to debridement Skin Barriers/Peri-Wound Care Wound #4 Left,Distal,Medial Lower Leg o Moisturizing lotion Primary Wound Dressing Wound #4 Left,Distal,Medial Lower Leg o Aquacel Ag Secondary Dressing Wound #4 Left,Distal,Medial Lower Leg o ABD pad Dressing Change Frequency Wound #4 Left,Distal,Medial Lower Leg o Change dressing every week Follow-up Appointments Adam Merritt, Adam Merritt. ([id number]) Wound #4 Left,Distal,Medial Lower Leg o Return Appointment in 1 week. Edema Control Wound #4 Left,Distal,Medial Lower Leg o Kerlix and Coban - Bilateral - unna to anchor Additional Orders / Instructions Wound #4 Left,Distal,Medial Lower Leg o Increase protein intake. Notes We are going to switch his dressing per above to a silver collagen dressing for the next week.  We will see him for reevaluation at that point. If anything worsens in the interim patient will contact our office for additional recommendations. Otherwise it was a pleasure seeing him today and hopefully this one will continue to show signs of great improvement over the next several weeks. Electronic Signature(s) Signed: 01/08/2017 4:13:08 PM By: Worthy Keeler PA-C Entered By: Worthy Keeler on 01/08/2017 14:31:24 Adam Merritt ([id number]) -------------------------------------------------------------------------------- Problem List Details Patient Name: Adam Merritt Date of Service: 01/08/2017 1:30 PM Medical Record Number: [id number] Patient Account Number: [id number] Date of Birth/Sex: 1945-08-12 (71 y.o. 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 Male) Treating RN: Montey Hora Primary Care Provider: [name] Other  Clinician: Referring Provider: [name] Treating Provider/Extender: Melburn Hake, Shervin Cypert [name]: 14 Subjective Chief Complaint Information obtained from Patient the patient comes along with his wife for excessive swelling of both lower extremities left more than right and weeping of fluid History of Present Illness (HPI) The following HPI elements were documented for the patient's  wound: Location: bilateral lower leg edema with some blisters recently and weeping of fluid left side more than right Severity: Patient states wound are getting better Duration: Patient has had the wound for > 3 months prior to seeking treatment at the wound center Context: The wound appeared gradually over time Modifying Factors: Consults to this date include: he has been seen by his PCP and she sent him off to see [place]. Associated Signs and Symptoms: Patient reports having difficulty standing for long periods. 71 year old gentleman seen earlier at our clinic in June 2016 and again in June 2017 is noncompliant with recommendations and does not follow-up regularly. 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 He had referred him to the lymphedema clinic and he was accepted but he did not follow-up with them. At that stage he had agreed to get a repeat arterial study done and he has seen Dr. Trula Slade in [place]. This time around he has been seen by his PCP Dr. Tiburcio Bash who referred him urgently to [place] for bilateral lower extremity cellulitis in spite of being on antibiotics -- he was placed on Keflex and 500 mg 3 times a day for 10 days. His comorbidities include coronary artery disease, diabetes mellitus, hypertension, Bell's palsy and chronic lymphedema. he was also recently admitted to the  hospital on April 13 and kept overnight with the diagnosis of a TIA and a left ICA stenosis to follow-up with vascular surgery as an outpatient 10/05/2016 -- patient has been compliant this week and left his compression and dressing on for the entire week and had home health change it appropriately. 11/13/2016 -- his insurance will not cover compression stockings for lymphedema and hence we will have to send him to the lymphedema clinic as soon as we have finished with his healing. 11/20/2016 -- his lymphedema clinic appointment is still pending but other than that he has done very well and we are ready to transition him over. Adam Merritt, Adam Merritt ([id number]) 11/27/16 patient's wounds appeared to be completely healed on evaluation today. He still has bilateral left edema and has been referred to the lymphedema clinic although we are still waiting to hear back from them on scheduling. Apparently she has to be discharged from home health services prior to being able to establish with the lymphedema clinic. At this point considering he is completely healed from the standpoint of wounds of the bilateral lower extremities I think it is appropriate to discharge him from home health and initiate weekly wraps on our end. There is no evidence of infection. 01/01/2017 -- the patient was recently discharged on 12/04/2016 and asked to follow-up in the lymphedema clinic. However they seem to be some issues with his going to the lymphedema clinic and then referring him to dermatology for an opinion regarding his skin. Ultimately the patient did not have a follow-up and has now redeveloped wheezing from the right lower extremity and ulceration on the left lower extremity. The patient and his wife are rather helpless and do not seem to be very compliant with their care. 01/08/17 on evaluation today patient has just a single lower extremity wounds noted. Fortunately he is having no significant pain, no bowe or bladder  dysfunction, no nausea and no vomiting. There's nothing to indicate a systemic  worsening infection. ==== Old Notes: 71 year old gentleman who comes with bilateral lower limb edema for over 20 years and recent attack of blisters and redness for about 2 weeks. The patient says he has had bilateral lower limb edema since 1996. He has been noncompliant with wearing wraps in the past and even was given a boot to wear at night which she never tolerated and didn't wear it. He has a past medical history of sleep apnea, neuropathy with diabetes, hypertension, hyperlipidemia, coronary artery disease, varicose veins, carotid artery disease and is status post CABG and cholecystectomy. He was seen by the vascular surgeon on 09/29/2013 and Dr. Trula Slade opinion was:"I have reviewed his outside ultrasound which shows an ankle-brachial index of 0.72 on the left and 1.0 on the right. This study was repeated at our office. The ankle-brachial index on the right was 1.26 with triphasic waveforms. On the left it was a 1.04 with biphasic waveforms. The patient had a toe pressure of 121 on the right and 100 on the left. In summary, I would not recommend any further imaging , as I do not think his symptoms are related to arterial insufficiency, but rather secondary to diabetic neuropathy. The patient still has room for increasing his Neurontin dose, which he states does help him. I have scheduled him to come back in one year for a repeat arterial evaluation." in June 2016, a year ago, he has a venous duplex study to be done. He was also going to be accepted to the lymphedema clinic but I did not believe the patient went regularly for this. ==== Objective Adam Merritt, Adam Merritt ([id number]) Constitutional Obese and well-hydrated in no acute distress. Vitals Time Taken: 1:29 PM, Height: 65 in, Weight: 336 lbs, BMI: 55.9, Temperature: 98.4 F, Pulse: 72 bpm, Respiratory Rate: 18 breaths/min, Blood Pressure: 148/72  mmHg. Respiratory normal breathing without difficulty. clear to auscultation bilaterally. Cardiovascular regular rate and rhythm with normal S1, S2. 2+ pitting edema of the bilateral lower extremities, Left worse than right.. Psychiatric this patient is able to make decisions and demonstrates good insight into disease process. Alert and Oriented x 3. pleasant and cooperative. General Notes: Patient's wound has no surrounding erythema and no purulent discharge. He has an excellent granular bed noted at this point. Integumentary (Hair, Skin) Wound #4 status is Open. Original cause of wound was Gradually Appeared. The wound is located on the Left,Distal,Medial Lower Leg. The wound measures 1cm length x 0.8cm width x 0.1cm depth; 0.628cm^2 area and 0.063cm^3 volume. There is Fat Layer (Subcutaneous Tissue) Exposed exposed. There is no tunneling or undermining noted. There is a large amount of serous drainage noted. The wound margin is flat and intact. There is medium (34-66%) red granulation within the wound bed. There is a medium (34-66%) amount of necrotic tissue within the wound bed including Adherent Slough. The periwound skin appearance did not exhibit: Callus, Crepitus, Excoriation, Induration, Rash, Scarring, Dry/Scaly, Maceration, Atrophie Blanche, Cyanosis, Ecchymosis, Hemosiderin Staining, Mottled, Pallor, Rubor, Erythema. Periwound temperature was noted as No Abnormality. Wound #5 status is Healed - Epithelialized. Original cause of wound was Gradually Appeared. The wound is located on the Left,Proximal,Midline Lower Leg. The wound measures 0cm length x 0cm width x 0cm depth; 0cm^2 area and 0cm^3 volume. Wound #6 status is Healed - Epithelialized. Original cause of wound was Gradually Appeared. The wound is located on the Right,Medial Lower Leg. The wound measures 0cm length x 0cm width x 0cm depth; 0cm^2 area and 0cm^3 volume. 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 May shower with protection. Anesthetic: Wound #4 Left,Distal,Medial Lower Leg: Topical Lidocaine 4% cream applied to wound bed prior to debridement Skin Barriers/Peri-Wound Care: Wound #4 Left,Distal,Medial Lower Leg: Moisturizing lotion Primary Wound Dressing: Wound #4 Left,Distal,Medial Lower Leg: Aquacel Ag Secondary Dressing: Wound #4 Left,Distal,Medial Lower Leg: ABD pad Dressing Change Frequency: Wound #4 Left,Distal,Medial Lower Leg: Change dressing every week Follow-up Appointments: Wound #4 Left,Distal,Medial Lower Leg: Return Appointment in 1 week. Edema Control: Wound #4 Left,Distal,Medial Lower Leg: Kerlix and Coban - Bilateral - unna to anchor Additional Orders / Instructions: Wound #4 Left,Distal,Medial Lower Leg: Increase protein intake. General Notes: We are going to switch his dressing per above to a silver collagen dressing for the next week. We will see him for reevaluation at that point. If anything worsens in the interim patient will contact our office for additional recommendations. Otherwise it was a pleasure seeing him today and hopefully this one will continue to show signs of great improvement over the next several weeks. Adam Merritt, Adam Merritt ([id number]) Electronic Signature(s) Signed: 01/08/2017 4:13:08 PM By: Worthy Keeler PA-C Entered By: Worthy Keeler on 01/08/2017 14:31:35 Adam Merritt ([id number]) -------------------------------------------------------------------------------- SuperBill Details Patient Name:  Adam Merritt Date of Service: 01/08/2017 Medical Record Number: [id number] Patient Account Number: [id number] Date of Birth/Sex: Aug 11, 1945 (71 y.o. 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 Facility Procedures CPT4 Code: [id number] Description: 99214 - WOUND CARE VISIT-LEV 4 EST PT Modifier: Quantity: 1 Physician Procedures CPT4 Code Description: [id number] - WC PHYS LEVEL 3 - EST PT ICD-10 Description Diagnosis 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

## 2017-01-09 NOTE — Progress Notes (Signed)
JHADEN, PIZZUTO ([id number]) Visit Report for 01/08/2017 Arrival Information Details Patient Name: Adam Merritt, Adam Merritt Date of Service: 01/08/2017 1:30 PM Medical Record Number: [id number] Patient Account Number: [id number] Date of Birth/Sex: 07/17/1945 (71 y.o. Male) Treating RN: Montey Hora Primary Care Wilver Tignor: [name] Other Clinician: Referring Cane Dubray: [name] Treating Davionte Lusby/Extender: Melburn Hake, [name]: 14 Visit Information History Since Last Visit All ordered tests and consults were completed: No Patient Arrived: [name] Added or deleted any medications: No Arrival Time: 13:28 Any new allergies or adverse reactions: No Accompanied By: spouse Had a fall or experienced change in No Transfer Assistance: None activities of daily living that may affect Patient Identification Verified: Yes risk of falls: Secondary Verification Process Yes Signs or symptoms of abuse/neglect since last No Completed: visito Patient Requires Transmission- No Hospitalized since last visit: No Based Precautions: Has Dressing in Place as Prescribed: Yes Patient Has Alerts: Yes Has Compression in Place as Prescribed: Yes Patient Alerts: DMII Pain Present Now: Yes ABI [place] BILATERAL >220 Electronic Signature(s) Signed: 01/08/2017 4:48:06 PM By: Montey Hora Entered By: Montey Hora on 01/08/2017 13:29:22 Adam Merritt ([id number]) -------------------------------------------------------------------------------- Clinic Level of Care Assessment Details Patient Name: Adam Merritt Date of Service: 01/08/2017 1:30 PM Medical Record Number: [id number] Patient Account Number: [id number] Date of Birth/Sex: 11/05/1945 (71 y.o. Male) Treating RN: Montey Hora Primary Care Pinchos Topel: [name] Other Clinician: Referring Anahy Esh: [name] Treating Jacquel Redditt/Extender: Melburn Hake, [name]: 14 Clinic Level of Care Assessment Items TOOL 4 Quantity Score _0  - Use  when only an EandM is performed on FOLLOW-UP visit 0 ASSESSMENTS - Nursing Assessment / Reassessment X - Reassessment of Co-morbidities (includes updates in patient status) 1 10 X - Reassessment of Adherence to Treatment Plan 1 5 ASSESSMENTS - Wound and Skin Assessment / Reassessment _1  - Simple Wound Assessment / Reassessment - one wound 0 X - Complex Wound Assessment / Reassessment - multiple wounds 3 5 _2  - Dermatologic / Skin Assessment (not related to wound area) 0 ASSESSMENTS - Focused Assessment X - Circumferential Edema Measurements - multi extremities 2 5 _3  - Nutritional Assessment / Counseling / Intervention 0 X - Lower Extremity Assessment (monofilament, tuning fork, pulses) 1 5 _4  - Peripheral Arterial Disease Assessment (using hand held doppler) 0 ASSESSMENTS - Ostomy and/or Continence Assessment and Care _5  - Incontinence Assessment and Management 0 _6  - Ostomy Care Assessment and Management (repouching, etc.) 0 PROCESS - Coordination of Care X - Simple Patient / Family Education for ongoing care 1 15 _7  - Complex (extensive) Patient / Family Education for ongoing care 0 _8  - Staff obtains Programmer, systems, Records, Test Results / Process Orders 0 _9  - Staff telephones HHA, Nursing Homes / Clarify orders / etc 0 _10  - Routine Transfer to another Facility (non-emergent condition) 0 BEARETT, PORCARO ([id number]) _11  - Routine Hospital Admission (non-emergent condition) 0 _12  - New Admissions / Biomedical engineer / Ordering NPWT, Apligraf, etc. 0 _13  - Emergency Hospital Admission (emergent condition) 0 X - Simple Discharge Coordination 1 10 _14  - Complex (extensive) Discharge Coordination 0 PROCESS - Special Needs _15  - Pediatric / Minor Patient Management 0 _16  - Isolation Patient Management 0 _17  - Hearing / Language / Visual special needs 0 _18  - Assessment of Community assistance (transportation, D/C planning, etc.) 0 _19  - Additional assistance / Altered mentation 0 _20  - Support  Surface(s) Assessment (bed, cushion, seat, etc.) 0 INTERVENTIONS - Wound Cleansing / Measurement _21  - Simple Wound Cleansing - one  wound 0 X - Complex Wound Cleansing - multiple wounds 3 5 X - Wound Imaging (photographs - any number of wounds) 1 5 _0  - Wound Tracing (instead of photographs) 0 _1  - Simple Wound Measurement - one wound 0 X - Complex Wound Measurement - multiple wounds 3 5 INTERVENTIONS - Wound Dressings _2  - Small Wound Dressing one or multiple wounds 0 _3  - Medium Wound Dressing one or multiple wounds 0 X - Large Wound Dressing one or multiple wounds 1 20 <CBJSEGBTDVVOHYWV>_3<\/XTGGYIRSWNIOEVOJ>_5  - Application of Medications - topical 0 <KKXFGHWEXHBZJIRC>_7<\/ELFYBOFBPZWCHENI>_7  - Application of Medications - injection 0 INTERVENTIONS - Miscellaneous _6  - External ear exam 0 LAZER, WOLLARD ([id number]) _7  - Specimen Collection (cultures, biopsies, blood, body fluids, etc.) 0 _8  - Specimen(s) / Culture(s) sent or taken to Lab for analysis 0 _9  - Patient Transfer (multiple staff / Harrel Lemon Lift / Similar devices) 0 _10  - Simple Staple / Suture removal (25 or less) 0 _11  - Complex Staple / Suture removal (26 or more) 0 _12  - Hypo / Hyperglycemic Management (close monitor of Blood Glucose) 0 _13  - Ankle / Brachial Index (ABI) - do not check if billed separately 0 X - Vital Signs 1 5 Has the patient been seen at the hospital within the last three years: Yes Total Score: 130 Level Of Care: New/Established - Level 4 Electronic Signature(s) Signed: 01/08/2017 4:48:06 PM By: Montey Hora Entered By: Montey Hora on 01/08/2017 15:23:17 Adam Merritt ([id number]) -------------------------------------------------------------------------------- Encounter Discharge Information Details Patient Name: Adam Merritt Date of Service: 01/08/2017 1:30 PM Medical Record Number: [id number] Patient Account Number: [id number] Date of Birth/Sex: 01/25/1946 (71 y.o. Male) Treating RN: Montey Hora Primary Care Abel Ra: [name] Other Clinician: Referring Saraiyah Hemminger:  [name] Treating Latrisa Hellums/Extender: Melburn Hake, [name]: 14 Encounter Discharge Information Items Discharge Pain Level: 0 Discharge Condition: Stable Ambulatory Status: Cane Discharge Destination: Home Transportation: Private Auto Accompanied By: spouse Schedule Follow-up Appointment: Yes Medication Reconciliation completed No and provided to Patient/Care Trellis Vanoverbeke: Provided on Clinical Summary of Care: 01/08/2017 Form Type Recipient Paper Patient JT Electronic Signature(s) Signed: 01/08/2017 3:24:33 PM By: Montey Hora Previous Signature: 01/08/2017 2:12:07 PM Version By: Ruthine Dose Entered By: Montey Hora on 01/08/2017 15:24:32 Adam Merritt ([id number]) -------------------------------------------------------------------------------- Lower Extremity Assessment Details Patient Name: Adam Merritt Date of Service: 01/08/2017 1:30 PM Medical Record Number: [id number] Patient Account Number: [id number] Date of Birth/Sex: 1945/06/13 (71 y.o. Male) Treating RN: Montey Hora Primary Care Rye Dorado: [name] Other Clinician: Referring Vivaan Helseth: [name] Treating Juanito Gonyer/Extender: Melburn Hake, [name]: 14 Edema Assessment Assessed: [Left: No] [Right: No] Edema: [Left: Yes] [Right: Yes] Calf Left: Right: Point of Measurement: 34 cm From Medial Instep 53.6 cm 48.3 cm Ankle Left: Right: Point of Measurement: 12 cm From Medial Instep 34.5 cm 29.5 cm Vascular Assessment Pulses: Dorsalis Pedis Palpable: [Left:Yes] [Right:Yes] Posterior Tibial Extremity colors, hair growth, and conditions: Extremity Color: [Left:Hyperpigmented] [Right:Hyperpigmented] Hair Growth on Extremity: [Left:No] [Right:No] Temperature of Extremity: [Left:Warm] [Right:Warm] Capillary Refill: [Left:< 3 seconds] [Right:< 3 seconds] Electronic Signature(s) Signed: 01/08/2017 4:48:06 PM By: Montey Hora Entered By: Montey Hora on 01/08/2017 13:53:13 Adam Merritt ([id number]) -------------------------------------------------------------------------------- Multi Wound Chart Details Patient Name: Adam Merritt Date of Service: 01/08/2017 1:30 PM Medical Record Number: [id number] Patient Account Number: [id number] Date of Birth/Sex: 05-27-1946 (71 y.o. Male) Treating RN: Montey Hora Primary Care Tiny Chaudhary: [name] Other Clinician: Referring Lavette Yankovich: [name] Treating Yelena Metzer/Extender: STONE III, [name]: 14 Vital Signs Height(in):  65 Pulse(bpm): 72 Weight(lbs): 336 Blood Pressure 148/72 (mmHg): Body Mass Index(BMI): 56 Temperature(F): 98.4 Respiratory Rate 18 (breaths/min): Photos: [4:No Photos] [5:No Photos] [6:No Photos] Wound Location: [4:Left Lower Leg - Medial, Distal] [5:Left, Proximal, Midline Lower Leg] [6:Right, Medial Lower Leg] Wounding Event: [4:Gradually Appeared] [5:Gradually Appeared] [6:Gradually Appeared] Primary Etiology: [4:Lymphedema] [5:Lymphedema] [6:Diabetic Wound/Ulcer of the Lower Extremity] Comorbid History: [4:Anemia, Lymphedema, Chronic Obstructive Pulmonary Disease (COPD), Sleep Apnea, Congestive Heart Failure, Coronary Artery Disease, Hypertension, Peripheral Venous Disease, Type II Diabetes, Osteoarthritis, Neuropathy, Received  Chemotherapy] [5:N/A] [6:N/A] Date Acquired: [4:12/25/2016] [5:12/25/2016] [6:12/25/2016] Weeks of Treatment: [4:1] [5:1] [6:1] Wound Status: [4:Open] [5:Healed - Epithelialized] [6:Healed - Epithelialized] Measurements L x W x D 1x0.8x0.1 [5:0x0x0] [6:0x0x0] (cm) Area (cm) : [4:0.628] [5:0] [6:0] Volume (cm) : [4:0.063] [5:0] [6:0] % Reduction in Area: [4:37.50%] [5:100.00%] [6:100.00%] % Reduction in Volume: 68.70% [5:100.00%] [6:100.00%] Classification: [4:Full Thickness Without Exposed Support Structures] [5:Partial Thickness] [6:Grade 1] HBO Classification: [4:Grade 1] [5:N/A] [6:N/A] Exudate Amount: Large N/A N/A Exudate Type: Serous N/A  N/A Exudate Color: amber N/A N/A Wound Margin: Flat and Intact N/A N/A Granulation Amount: Medium (34-66%) N/A N/A Granulation Quality: Red N/A N/A Necrotic Amount: Medium (34-66%) N/A N/A Exposed Structures: Fat Layer (Subcutaneous N/A N/A Tissue) Exposed: Yes Fascia: No Tendon: No Muscle: No Joint: No Bone: No Epithelialization: None N/A N/A Periwound Skin Texture: Excoriation: No No Abnormalities Noted No Abnormalities Noted Induration: No Callus: No Crepitus: No Rash: No Scarring: No Periwound Skin Maceration: No No Abnormalities Noted No Abnormalities Noted Moisture: Dry/Scaly: No Periwound Skin Color: Atrophie Blanche: No No Abnormalities Noted No Abnormalities Noted Cyanosis: No Ecchymosis: No Erythema: No Hemosiderin Staining: No Mottled: No Pallor: No Rubor: No Temperature: No Abnormality N/A N/A Tenderness on No No No Palpation: Wound Preparation: Ulcer Cleansing: N/A N/A Rinsed/Irrigated with Saline, Other: soap and water Topical Anesthetic Applied: None Treatment Notes Electronic Signature(s) Signed: 01/08/2017 4:48:06 PM By: Montey Hora Entered By: Montey Hora on 01/08/2017 13:53:28 Adam Merritt ([id number]) -------------------------------------------------------------------------------- [place] Details Patient Name: Adam Merritt Date of Service: 01/08/2017 1:30 PM Medical Record Number: [id number] Patient Account Number: [id number] Date of Birth/Sex: August 31, 1945 (71 y.o. Male) Treating RN: Montey Hora Primary Care Maresha Anastos: [name] Other Clinician: Referring Jayli Fogleman: [name] Treating Reveca Desmarais/Extender: Melburn Hake, [name]: 14 Active Inactive ` Abuse / Safety / Falls / Self Care Management Nursing Diagnoses: Impaired physical mobility Potential for falls Goals: Patient will remain injury free Target Resolution Date Initiated: 09/28/2016 Date Inactivated: 12/04/2016 Date: 12/08/2016 Goal  Status: Met Patient will remain injury free related to falls Date Initiated: 01/01/2017 Target Resolution Date: 03/10/2017 Goal Status: Active Interventions: Assess fall risk on admission and as needed Assess impairment of mobility on admission and as needed per policy Notes: ` Orientation to the Wound Care Program Nursing Diagnoses: Knowledge deficit related to the wound healing center program Goals: Patient/caregiver will verbalize understanding of the [place] Program Date Initiated: 09/28/2016 Target Resolution Date: 05/05/2017 Goal Status: Active Interventions: Provide education on orientation to the wound center Notes: HINTON, LUELLEN ([id number]) ` Wound/Skin Impairment Nursing Diagnoses: Impaired tissue integrity Knowledge deficit related to ulceration/compromised skin integrity Goals: Ulcer/skin breakdown will have a volume reduction of 80% by week 12 Date Initiated: 09/28/2016 Target Resolution Date: 04/14/2017 Goal Status: Active Interventions: Assess patient/caregiver ability to perform ulcer/skin care regimen upon admission and as needed Assess ulceration(s) every visit Notes: Electronic Signature(s) Signed: 01/08/2017 4:48:06 PM By: Montey Hora Entered By: Montey Hora on 01/08/2017 13:53:19  KAIZEN, IBSEN ([id number]) -------------------------------------------------------------------------------- Pain Assessment Details Patient Name: HARNOOR, KOHLES Date of Service: 01/08/2017 1:30 PM Medical Record Number: [id number] Patient Account Number: [id number] Date of Birth/Sex: Jan 01, 1946 (71 y.o. Male) Treating RN: Montey Hora Primary Care Abigail Teall: [name] Other Clinician: Referring Leani Myron: [name] Treating Steele Ledonne/Extender: Melburn Hake, [name]: 14 Active Problems Location of Pain Severity and Description of Pain Patient Has Paino Yes Site Locations Rate the pain. Current Pain Level: 3 Character of Pain Describe  the Pain: Aching Pain Management and Medication Current Pain Management: Electronic Signature(s) Signed: 01/08/2017 4:48:06 PM By: Montey Hora Entered By: Montey Hora on 01/08/2017 13:29:47 Adam Merritt ([id number]) -------------------------------------------------------------------------------- Patient/Caregiver Education Details Patient Name: Adam Merritt Date of Service: 01/08/2017 1:30 PM Medical Record Number: [id number] Patient Account Number: [id number] Date of Birth/Gender: 12-16-1945 (71 y.o. Male) Treating RN: Montey Hora Primary Care Physician: [name] Other Clinician: Referring Physician: [name] Treating Physician/Extender: Sharalyn Ink in Treatment: 14 Education Assessment Education Provided To: Patient and Caregiver Education Topics Provided Venous: Handouts: Other: leg elevation Methods: Explain/Verbal Responses: State content correctly Electronic Signature(s) Signed: 01/08/2017 4:48:06 PM By: Montey Hora Entered By: Montey Hora on 01/08/2017 15:24:51 Adam Merritt ([id number]) -------------------------------------------------------------------------------- Wound Assessment Details Patient Name: Adam Merritt Date of Service: 01/08/2017 1:30 PM Medical Record Number: [id number] Patient Account Number: [id number] Date of Birth/Sex: 06/15/1945 (71 y.o. Male) Treating RN: Montey Hora Primary Care Dayona Shaheen: [name] Other Clinician: Referring Jylian Pappalardo: [name] Treating Kollyns Mickelson/Extender: Melburn Hake, [name]: 14 Wound Status Wound Number: 4 Primary Lymphedema Etiology: Wound Location: Left Lower Leg - Medial, Distal Wound Open Wounding Event: Gradually Appeared Status: Date Acquired: 12/25/2016 Comorbid Anemia, Lymphedema, Chronic Weeks Of Treatment: 1 History: Obstructive Pulmonary Disease (COPD), Clustered Wound: No Sleep Apnea, Congestive Heart Failure, Coronary Artery Disease,  Hypertension, Peripheral Venous Disease, Type II Diabetes, Osteoarthritis, Neuropathy, Received Chemotherapy Photos Photo Uploaded By: Montey Hora on 01/08/2017 14:57:46 Wound Measurements Length: (cm) 1 Width: (cm) 0.8 Depth: (cm) 0.1 Area: (cm) 0.628 Volume: (cm) 0.063 % Reduction in Area: 37.5% % Reduction in Volume: 68.7% Epithelialization: None Tunneling: No Undermining: No Wound Description Full Thickness Without Classification: Exposed Support Structures Diabetic Severity Grade 1 (Wagner): Wound Margin: Flat and Intact Exudate Amount: Large Exudate Type: Serous JAYDE, DAFFIN ([id number]) Foul Odor After Cleansing: No Slough/Fibrino Yes Exudate Color: amber Wound Bed Granulation Amount: Medium (34-66%) Exposed Structure Granulation Quality: Red Fascia Exposed: No Necrotic Amount: Medium (34-66%) Fat Layer (Subcutaneous Tissue) Exposed: Yes Necrotic Quality: Adherent Slough Tendon Exposed: No Muscle Exposed: No Joint Exposed: No Bone Exposed: No Periwound Skin Texture Texture Color No Abnormalities Noted: No No Abnormalities Noted: No Callus: No Atrophie Blanche: No Crepitus: No Cyanosis: No Excoriation: No Ecchymosis: No Induration: No Erythema: No Rash: No Hemosiderin Staining: No Scarring: No Mottled: No Pallor: No Moisture Rubor: No No Abnormalities Noted: No Dry / Scaly: No Temperature / Pain Maceration: No Temperature: No Abnormality Wound Preparation Ulcer Cleansing: Rinsed/Irrigated with Saline, Other: soap and water, Topical Anesthetic Applied: None Treatment Notes Wound #4 (Left, Distal, Medial Lower Leg) 1. Cleansed with: Cleanse wound with antibacterial soap and water 2. Anesthetic Topical Lidocaine 4% cream to wound bed prior to debridement 3. Peri-wound Care: Moisturizing lotion 4. Dressing Applied: Prisma Ag 5. Secondary Dressing Applied ABD Pad 7. Secured with Other (specify in notes) Notes kerlix, coban,  unna to anchor LUI, BELLIS ([id number]) Electronic Signature(s) Signed: 01/08/2017 4:48:06 PM By: Montey Hora Entered By:  Montey Hora on 01/08/2017 13:40:26 ALMOND, FITZGIBBON ([id number]) -------------------------------------------------------------------------------- Wound Assessment Details Patient Name: ASAPH, SERENA Date of Service: 01/08/2017 1:30 PM Medical Record Number: [id number] Patient Account Number: [id number] Date of Birth/Sex: [birth year] (71 y.o. Male) Treating RN: Montey Hora Primary Care Lorijean Husser: [name] Other Clinician: Referring Sunny Gains: [name] Treating Berea Majkowski/Extender: Melburn Hake, [name]: 14 Wound Status Wound Number: 5 Primary Etiology: Lymphedema Wound Location: Left, Proximal, Midline Lower Wound Status: Healed - Epithelialized Leg Wounding Event: Gradually Appeared Date Acquired: 12/25/2016 Weeks Of Treatment: 1 Clustered Wound: No Photos Photo Uploaded By: Montey Hora on 01/08/2017 14:58:05 Wound Measurements Length: (cm) 0 % Reduction Width: (cm) 0 % Reduction Depth: (cm) 0 Area: (cm) 0 Volume: (cm) 0 in Area: 100% in Volume: 100% Wound Description Classification: Partial Thickness Periwound Skin Texture Texture Color No Abnormalities Noted: No No Abnormalities Noted: No Moisture No Abnormalities Noted: No Electronic Signature(s) Signed: 01/08/2017 4:48:06 PM By: Leonides Cave ([id number]) Entered By: Montey Hora on 01/08/2017 13:40:41 Adam Merritt ([id number]) -------------------------------------------------------------------------------- Wound Assessment Details Patient Name: Adam Merritt Date of Service: 01/08/2017 1:30 PM Medical Record Number: [id number] Patient Account Number: [id number] Date of Birth/Sex: Aug 04, 1945 (71 y.o. Male) Treating RN: Montey Hora Primary Care Metzli Pollick: [name] Other Clinician: Referring Sammye Staff: [name] Treating  Banesa Tristan/Extender: STONE III, [name]: 14 Wound Status Wound Number: 6 Primary Diabetic Wound/Ulcer of the Lower Etiology: Extremity Wound Location: Right, Medial Lower Leg Wound Status: Healed - Epithelialized Wounding Event: Gradually Appeared Date Acquired: 12/25/2016 Weeks Of Treatment: 1 Clustered Wound: No Photos Photo Uploaded By: Montey Hora on 01/08/2017 14:57:03 Wound Measurements Length: (cm) 0 % Reduction Width: (cm) 0 % Reduction Depth: (cm) 0 Area: (cm) 0 Volume: (cm) 0 in Area: 100% in Volume: 100% Wound Description Classification: Grade 1 Periwound Skin Texture Texture Color No Abnormalities Noted: No No Abnormalities Noted: No Moisture No Abnormalities Noted: No Electronic Signature(s) Signed: 01/08/2017 4:48:06 PM By: Leonides Cave ([id number]) Entered By: Montey Hora on 01/08/2017 13:39:10 Adam Merritt ([id number]) -------------------------------------------------------------------------------- Vitals Details Patient Name: Adam Merritt Date of Service: 01/08/2017 1:30 PM Medical Record Number: [id number] Patient Account Number: [id number] Date of Birth/Sex: 12/11/1945 (71 y.o. Male) Treating RN: Montey Hora Primary Care Amay Mijangos: [name] Other Clinician: Referring Jakhiya Brower: [name] Treating Sascha Baugher/Extender: STONE III, [name]: 14 Vital Signs Time Taken: 13:29 Temperature (F): 98.4 Height (in): 65 Pulse (bpm): 72 Weight (lbs): 336 Respiratory Rate (breaths/min): 18 Body Mass Index (BMI): 55.9 Blood Pressure (mmHg): 148/72 Reference Range: 80 - 120 mg / dl Electronic Signature(s) Signed: 01/08/2017 4:48:06 PM By: Montey Hora Entered By: Montey Hora on 01/08/2017 13:31:42

## 2017-01-16 ENCOUNTER — Ambulatory Visit: Payer: Medicare Other | Admitting: Occupational Therapy

## 2017-01-16 ENCOUNTER — Encounter: Payer: Medicare Other | Admitting: Physician Assistant

## 2017-01-16 DIAGNOSIS — E11620 Type 2 diabetes mellitus with diabetic dermatitis: Secondary | ICD-10-CM | POA: Diagnosis not present

## 2017-01-18 ENCOUNTER — Ambulatory Visit: Payer: Medicare Other | Admitting: Occupational Therapy

## 2017-01-18 NOTE — Progress Notes (Signed)
Adam Merritt, Adam Merritt ([id number]) Visit Report for 01/16/2017 Arrival Information Details Patient Name: Adam Merritt, Adam Merritt Date of Service: 01/16/2017 11:00 AM Medical Record Number: [id number] Patient Account Number: [id number] Date of Birth/Sex: [birth year] (71 y.o. Male) Treating RN: Montey Hora Primary Care Amarie Tarte: [name] Other Clinician: Referring Japneet Staggs: [name] Treating Arletha Marschke/Extender: Melburn Hake, [name]: 15 Visit Information History Since Last Visit All ordered tests and consults were completed: No Patient Arrived: [name] Added or deleted any medications: No Arrival Time: 11:13 Any new allergies or adverse reactions: No Accompanied By: wife Had a fall or experienced change in No Transfer Assistance: EasyPivot Patient activities of daily living that may affect Lift risk of falls: Patient Identification Verified: Yes Signs or symptoms of abuse/neglect since last No Secondary Verification Process Yes visito Completed: Hospitalized since last visit: No Patient Requires Transmission- No Has Dressing in Place as Prescribed: Yes Based Precautions: Has Compression in Place as Prescribed: Yes Patient Has Alerts: Yes Pain Present Now: No Patient Alerts: DMII ABI [place] BILATERAL >220 Electronic Signature(s) Signed: 01/16/2017 4:51:44 PM By: Montey Hora Entered By: Montey Hora on 01/16/2017 11:13:39 Adam Merritt ([id number]) -------------------------------------------------------------------------------- Clinic Level of Care Assessment Details Patient Name: Adam Merritt Date of Service: 01/16/2017 11:00 AM Medical Record Number: [id number] Patient Account Number: [id number] Date of Birth/Sex: August 05, 1945 (71 y.o. Male) Treating RN: Montey Hora Primary Care Velma Hanna: [name] Other Clinician: Referring Senetra Dillin: [name] Treating Latese Dufault/Extender: Melburn Hake, [name]: 15 Clinic Level of Care Assessment Items TOOL 4  Quantity Score '[]'  - Use when only an EandM is performed on FOLLOW-UP visit 0 ASSESSMENTS - Nursing Assessment / Reassessment X - Reassessment of Co-morbidities (includes updates in patient status) 1 10 X - Reassessment of Adherence to Treatment Plan 1 5 ASSESSMENTS - Wound and Skin Assessment / Reassessment X - Simple Wound Assessment / Reassessment - one wound 1 5 '[]'  - Complex Wound Assessment / Reassessment - multiple wounds 0 '[]'  - Dermatologic / Skin Assessment (not related to wound area) 0 ASSESSMENTS - Focused Assessment X - Circumferential Edema Measurements - multi extremities 1 5 '[]'  - Nutritional Assessment / Counseling / Intervention 0 X - Lower Extremity Assessment (monofilament, tuning fork, pulses) 1 5 '[]'  - Peripheral Arterial Disease Assessment (using hand held doppler) 0 ASSESSMENTS - Ostomy and/or Continence Assessment and Care '[]'  - Incontinence Assessment and Management 0 '[]'  - Ostomy Care Assessment and Management (repouching, etc.) 0 PROCESS - Coordination of Care X - Simple Patient / Family Education for ongoing care 1 15 '[]'  - Complex (extensive) Patient / Family Education for ongoing care 0 '[]'  - Staff obtains Programmer, systems, Records, Test Results / Process Orders 0 '[]'  - Staff telephones HHA, Nursing Homes / Clarify orders / etc 0 '[]'  - Routine Transfer to another Facility (non-emergent condition) 0 Adam Merritt, Adam Merritt ([id number]) '[]'  - Routine Hospital Admission (non-emergent condition) 0 '[]'  - New Admissions / Biomedical engineer / Ordering NPWT, Apligraf, etc. 0 '[]'  - Emergency Hospital Admission (emergent condition) 0 X - Simple Discharge Coordination 1 10 '[]'  - Complex (extensive) Discharge Coordination 0 PROCESS - Special Needs '[]'  - Pediatric / Minor Patient Management 0 '[]'  - Isolation Patient Management 0 '[]'  - Hearing / Language / Visual special needs 0 '[]'  - Assessment of Community assistance (transportation, D/C planning, etc.) 0 '[]'  - Additional assistance / Altered  mentation 0 '[]'  - Support Surface(s) Assessment (bed, cushion, seat, etc.) 0 INTERVENTIONS - Wound Cleansing / Measurement X - Simple Wound Cleansing -  one wound 1 5 '[]'  - Complex Wound Cleansing - multiple wounds 0 X - Wound Imaging (photographs - any number of wounds) 1 5 '[]'  - Wound Tracing (instead of photographs) 0 X - Simple Wound Measurement - one wound 1 5 '[]'  - Complex Wound Measurement - multiple wounds 0 INTERVENTIONS - Wound Dressings '[]'  - Small Wound Dressing one or multiple wounds 0 '[]'  - Medium Wound Dressing one or multiple wounds 0 X - Large Wound Dressing one or multiple wounds 1 20 '[]'  - Application of Medications - topical 0 '[]'  - Application of Medications - injection 0 INTERVENTIONS - Miscellaneous '[]'  - External ear exam 0 Adam Merritt, Adam Merritt ([id number]) '[]'  - Specimen Collection (cultures, biopsies, blood, body fluids, etc.) 0 '[]'  - Specimen(s) / Culture(s) sent or taken to Lab for analysis 0 '[]'  - Patient Transfer (multiple staff / Civil Service fast streamer / Similar devices) 0 '[]'  - Simple Staple / Suture removal (25 or less) 0 '[]'  - Complex Staple / Suture removal (26 or more) 0 '[]'  - Hypo / Hyperglycemic Management (close monitor of Blood Glucose) 0 '[]'  - Ankle / Brachial Index (ABI) - do not check if billed separately 0 X - Vital Signs 1 5 Has the patient been seen at the hospital within the last three years: Yes Total Score: 95 Level Of Care: New/Established - Level 3 Electronic Signature(s) Signed: 01/16/2017 4:51:44 PM By: Montey Hora Entered By: Montey Hora on 01/16/2017 13:00:13 Adam Merritt ([id number]) -------------------------------------------------------------------------------- Encounter Discharge Information Details Patient Name: Adam Merritt Date of Service: 01/16/2017 11:00 AM Medical Record Number: [id number] Patient Account Number: [id number] Date of Birth/Sex: 1946-05-23 (71 y.o. Male) Treating RN: Montey Hora Primary Care Rylin Seavey: [name] Other  Clinician: Referring Matej Sappenfield: [name] Treating Neima Lacross/Extender: Melburn Hake, [name]: 15 Encounter Discharge Information Items Discharge Pain Level: 0 Discharge Condition: Stable Ambulatory Status: Cane Discharge Destination: Home Transportation: Private Auto Accompanied By: spouse Schedule Follow-up Appointment: Yes Medication Reconciliation completed No and provided to Patient/Care Donetta Isaza: Provided on Clinical Summary of Care: 01/16/2017 Form Type Recipient Paper Patient JT Electronic Signature(s) Signed: 01/16/2017 1:01:12 PM By: Montey Hora Entered By: Montey Hora on 01/16/2017 13:01:12 Adam Merritt ([id number]) -------------------------------------------------------------------------------- Lower Extremity Assessment Details Patient Name: Adam Merritt Date of Service: 01/16/2017 11:00 AM Medical Record Number: [id number] Patient Account Number: [id number] Date of Birth/Sex: 08-Oct-1945 (71 y.o. Male) Treating RN: Montey Hora Primary Care Appollonia Klee: [name] Other Clinician: Referring Wynston Romey: [name] Treating Brigetta Beckstrom/Extender: Melburn Hake, [name]: 15 Edema Assessment Assessed: [Left: No] [Right: No] E[Left: dema] [Right: :] Calf Left: Right: Point of Measurement: 34 cm From Medial Instep 53.6 cm 48.3 cm Ankle Left: Right: Point of Measurement: 12 cm From Medial Instep 34.5 cm 29.5 cm Vascular Assessment Pulses: Dorsalis Pedis Palpable: [Left:Yes] [Right:Yes] Posterior Tibial Extremity colors, hair growth, and conditions: Extremity Color: [Left:Hyperpigmented] [Right:Hyperpigmented] Temperature of Extremity: [Left:Warm] [Right:Warm] Capillary Refill: [Left:< 3 seconds] [Right:< 3 seconds] Toe Nail Assessment Left: Right: Thick: Yes Yes Discolored: Yes Yes Deformed: No No Improper Length and Hygiene: Yes Yes Electronic Signature(s) Signed: 01/16/2017 4:51:44 PM By: Montey Hora Entered By: Montey Hora on 01/16/2017 11:25:24 Adam Merritt ([id number]) -------------------------------------------------------------------------------- Multi Wound Chart Details Patient Name: Adam Merritt Date of Service: 01/16/2017 11:00 AM Medical Record Number: [id number] Patient Account Number: [id number] Date of Birth/Sex: 10/15/1945 (71 y.o. Male) Treating RN: Montey Hora Primary Care Aliesha Dolata: [name] Other Clinician: Referring Livana Yerian: [name] Treating Kareli Hossain/Extender: STONE III, [name] in  Treatment: 15 Vital Signs Height(in): 65 Pulse(bpm): 79 Weight(lbs): 336 Blood Pressure 152/87 (mmHg): Body Mass Index(BMI): 56 Temperature(F): 98.1 Respiratory Rate 18 (breaths/min): Photos: [4:No Photos] [N/A:N/A] Wound Location: [4:Left Lower Leg - Medial, Distal] [N/A:N/A] Wounding Event: [4:Gradually Appeared] [N/A:N/A] Primary Etiology: [4:Lymphedema] [N/A:N/A] Comorbid History: [4:Anemia, Lymphedema, Chronic Obstructive Pulmonary Disease (COPD), Sleep Apnea, Congestive Heart Failure, Coronary Artery Disease, Hypertension, Peripheral Venous Disease, Type II Diabetes, Osteoarthritis, Neuropathy, Received  Chemotherapy] [N/A:N/A] Date Acquired: [4:12/25/2016] [N/A:N/A] Weeks of Treatment: [4:2] [N/A:N/A] Wound Status: [4:Open] [N/A:N/A] Measurements L x W x D 0.5x0.4x0.1 [N/A:N/A] (cm) Area (cm) : [4:0.157] [N/A:N/A] Volume (cm) : [4:0.016] [N/A:N/A] % Reduction in Area: [4:84.40%] [N/A:N/A] % Reduction in Volume: 92.00% [N/A:N/A] Classification: [4:Full Thickness Without Exposed Support Structures] [N/A:N/A] HBO Classification: [4:Grade 1] [N/A:N/A] Exudate Amount: [4:Large] [N/A:N/A] Exudate Type: Serous N/A N/A Exudate Color: amber N/A N/A Wound Margin: Flat and Intact N/A N/A Granulation Amount: Medium (34-66%) N/A N/A Granulation Quality: Red N/A N/A Necrotic Amount: Medium (34-66%) N/A N/A Exposed Structures: Fat Layer (Subcutaneous N/A N/A Tissue)  Exposed: Yes Fascia: No Tendon: No Muscle: No Joint: No Bone: No Epithelialization: Medium (34-66%) N/A N/A Periwound Skin Texture: Excoriation: No N/A N/A Induration: No Callus: No Crepitus: No Rash: No Scarring: No Periwound Skin Maceration: No N/A N/A Moisture: Dry/Scaly: No Periwound Skin Color: Atrophie Blanche: No N/A N/A Cyanosis: No Ecchymosis: No Erythema: No Hemosiderin Staining: No Mottled: No Pallor: No Rubor: No Temperature: No Abnormality N/A N/A Tenderness on No N/A N/A Palpation: Wound Preparation: Ulcer Cleansing: N/A N/A Rinsed/Irrigated with Saline, Other: soap and water Topical Anesthetic Applied: None Treatment Notes Electronic Signature(s) Signed: 01/16/2017 4:51:44 PM By: Montey Hora Entered By: Montey Hora on 01/16/2017 11:26:04 Adam Merritt ([id number]) -------------------------------------------------------------------------------- [place] Details Patient Name: Adam Merritt Date of Service: 01/16/2017 11:00 AM Medical Record Number: [id number] Patient Account Number: [id number] Date of Birth/Sex: [birth year] (71 y.o. Male) Treating RN: Montey Hora Primary Care Renn Dirocco: [name] Other Clinician: Referring Suhayla Chisom: [name] Treating Adhira Jamil/Extender: Melburn Hake, [name]: 15 Active Inactive ` Abuse / Safety / Falls / Self Care Management Nursing Diagnoses: Impaired physical mobility Potential for falls Goals: Patient will remain injury free Target Resolution Date Initiated: 09/28/2016 Date Inactivated: 12/04/2016 Date: 12/08/2016 Goal Status: Met Patient will remain injury free related to falls Date Initiated: 01/01/2017 Target Resolution Date: 03/10/2017 Goal Status: Active Interventions: Assess fall risk on admission and as needed Assess impairment of mobility on admission and as needed per policy Notes: ` Orientation to the Wound Care Program Nursing Diagnoses: Knowledge  deficit related to the wound healing center program Goals: Patient/caregiver will verbalize understanding of the Real Program Date Initiated: 09/28/2016 Target Resolution Date: 05/05/2017 Goal Status: Active Interventions: Provide education on orientation to the wound center Notes: Adam Merritt, Adam Merritt ([id number]) ` Wound/Skin Impairment Nursing Diagnoses: Impaired tissue integrity Knowledge deficit related to ulceration/compromised skin integrity Goals: Ulcer/skin breakdown will have a volume reduction of 80% by week 12 Date Initiated: 09/28/2016 Target Resolution Date: 04/14/2017 Goal Status: Active Interventions: Assess patient/caregiver ability to perform ulcer/skin care regimen upon admission and as needed Assess ulceration(s) every visit Notes: Electronic Signature(s) Signed: 01/16/2017 4:51:44 PM By: Montey Hora Entered By: Montey Hora on 01/16/2017 11:25:38 Adam Merritt ([id number]) -------------------------------------------------------------------------------- Pain Assessment Details Patient Name: Adam Merritt Date of Service: 01/16/2017 11:00 AM Medical Record Number: [id number] Patient Account Number: [id number] Date of Birth/Sex: 06-13-1945 (71 y.o. Male) Treating RN: Montey Hora Primary Care Anja Neuzil: [name] Other  Clinician: Referring Courteney Alderete: [name] Treating Refoel Palladino/Extender: Melburn Hake, [name]: 15 Active Problems Location of Pain Severity and Description of Pain Patient Has Paino No Site Locations With Dressing Change: No Character of Pain Describe the Pain: Splitting Pain Management and Medication Current Pain Management: Electronic Signature(s) Signed: 01/16/2017 4:51:44 PM By: Montey Hora Entered By: Montey Hora on 01/16/2017 11:13:49 Adam Merritt ([id number]) -------------------------------------------------------------------------------- Patient/Caregiver Education Details Patient Name:  Adam Merritt Date of Service: 01/16/2017 11:00 AM Medical Record Number: [id number] Patient Account Number: [id number] Date of Birth/Gender: 1946/02/27 (71 y.o. Male) Treating RN: Montey Hora Primary Care Physician: [name] Other Clinician: Referring Physician: [name] Treating Physician/Extender: Sharalyn Ink in Treatment: 15 Education Assessment Education Provided To: Patient Education Topics Provided Venous: Handouts: Other: leg elevation Methods: Explain/Verbal Responses: State content correctly Electronic Signature(s) Signed: 01/16/2017 4:51:44 PM By: Montey Hora Entered By: Montey Hora on 01/16/2017 13:01:56 Adam Merritt ([id number]) -------------------------------------------------------------------------------- Wound Assessment Details Patient Name: Adam Merritt Date of Service: 01/16/2017 11:00 AM Medical Record Number: [id number] Patient Account Number: [id number] Date of Birth/Sex: [birth year] (71 y.o. Male) Treating RN: Montey Hora Primary Care Treacy Holcomb: [name] Other Clinician: Referring Callahan Peddie: [name] Treating Romie Tay/Extender: Melburn Hake, [name]: 15 Wound Status Wound Number: 4 Primary Lymphedema Etiology: Wound Location: Left Lower Leg - Medial, Distal Wound Open Wounding Event: Gradually Appeared Status: Date Acquired: 12/25/2016 Comorbid Anemia, Lymphedema, Chronic Weeks Of Treatment: 2 History: Obstructive Pulmonary Disease (COPD), Clustered Wound: No Sleep Apnea, Congestive Heart Failure, Coronary Artery Disease, Hypertension, Peripheral Venous Disease, Type II Diabetes, Osteoarthritis, Neuropathy, Received Chemotherapy Photos Photo Uploaded By: Montey Hora on 01/16/2017 12:52:21 Wound Measurements Length: (cm) 0.5 Width: (cm) 0.4 Depth: (cm) 0.1 Area: (cm) 0.157 Volume: (cm) 0.016 % Reduction in Area: 84.4% % Reduction in Volume: 92% Epithelialization: Medium  (34-66%) Tunneling: No Undermining: No Wound Description Full Thickness Without Classification: Exposed Support Structures Diabetic Severity Grade 1 (Wagner): Wound Margin: Flat and Intact Exudate Amount: Large Exudate Type: Serous Adam Merritt, Adam Merritt ([id number]) Foul Odor After Cleansing: No Slough/Fibrino Yes Exudate Color: amber Wound Bed Granulation Amount: Medium (34-66%) Exposed Structure Granulation Quality: Red Fascia Exposed: No Necrotic Amount: Medium (34-66%) Fat Layer (Subcutaneous Tissue) Exposed: Yes Necrotic Quality: Adherent Slough Tendon Exposed: No Muscle Exposed: No Joint Exposed: No Bone Exposed: No Periwound Skin Texture Texture Color No Abnormalities Noted: No No Abnormalities Noted: No Callus: No Atrophie Blanche: No Crepitus: No Cyanosis: No Excoriation: No Ecchymosis: No Induration: No Erythema: No Rash: No Hemosiderin Staining: No Scarring: No Mottled: No Pallor: No Moisture Rubor: No No Abnormalities Noted: No Dry / Scaly: No Temperature / Pain Maceration: No Temperature: No Abnormality Wound Preparation Ulcer Cleansing: Rinsed/Irrigated with Saline, Other: soap and water, Topical Anesthetic Applied: None Treatment Notes Wound #4 (Left, Distal, Medial Lower Leg) 1. Cleansed with: Cleanse wound with antibacterial soap and water 3. Peri-wound Care: Moisturizing lotion 4. Dressing Applied: Aquacel Ag 5. Secondary Dressing Applied Dry Gauze 7. Secured with Tape Other (specify in notes) Notes kerlix, coban, unna to Engineer, production) Signed: 01/16/2017 4:51:44 PM By: Leonides Cave ([id number]) Entered By: Montey Hora on 01/16/2017 11:24:21 Adam Merritt ([id number]) -------------------------------------------------------------------------------- Vitals Details Patient Name: Adam Merritt Date of Service: 01/16/2017 11:00 AM Medical Record Number: [id number] Patient Account Number:  [id number] Date of Birth/Sex: [birth year] (71 y.o. Male) Treating RN: Montey Hora Primary Care Laird Runnion: [name] Other Clinician: Referring Ossie Yebra: [name] Treating Tenille Morrill/Extender: Melburn Hake, [name]  in Treatment: 15 Vital Signs Time Taken: 11:13 Temperature (F): 98.1 Height (in): 65 Pulse (bpm): 79 Weight (lbs): 336 Respiratory Rate (breaths/min): 18 Body Mass Index (BMI): 55.9 Blood Pressure (mmHg): 152/87 Reference Range: 80 - 120 mg / dl Electronic Signature(s) Signed: 01/16/2017 4:51:44 PM By: Montey Hora Entered By: Montey Hora on 01/16/2017 11:14:14

## 2017-01-18 NOTE — Progress Notes (Signed)
FINNEAN, CERAMI ([id number]) Visit Report for 01/16/2017 Chief Complaint Document Details Patient Name: Adam Merritt, Adam Merritt Date of Service: 01/16/2017 11:00 AM Medical Record Number: [id number] Patient Account Number: [id number] Date of Birth/Sex: 1945/06/26 (71 y.o. Male) Treating RN: Montey Hora Primary Care Provider: [name] Other Clinician: Referring Provider: [name] Treating Provider/Extender: Melburn Hake,  [name]: 15 Information Obtained from: Patient Chief Complaint the patient comes along with his wife for excessive swelling of both lower extremities left more than right and weeping of fluid Electronic Signature(s) Signed: 01/17/2017 1:25:22 AM By: Worthy Keeler PA-C Entered By: Worthy Keeler on 01/16/2017 11:33:03 Adam Merritt ([id number]) -------------------------------------------------------------------------------- HPI Details Patient Name: Adam Merritt Date of Service: 01/16/2017 11:00 AM Medical Record Number: [id number] Patient Account Number: [id number] Date of Birth/Sex: 12/31/1945 (71 y.o. Male) Treating RN: Montey Hora Primary Care Provider: [name] Other Clinician: Referring Provider: [name] Treating Provider/Extender: Melburn Hake,  [name]: 15 History of Present Illness Location: bilateral lower leg edema with some blisters recently and weeping of fluid left side more than right Severity: Patient states wound are getting better Duration: Patient has had the wound for > 3 months prior to seeking treatment at the wound center Context: The wound appeared gradually over time Modifying Factors: Consults to this date include: he has been seen by his PCP and she sent him off to see [place]. Associated Signs and Symptoms: Patient reports having difficulty standing for long periods. HPI Description: 71 year old gentleman seen earlier at our clinic in June 2016 and again in June 2017 is noncompliant with recommendations  and does not follow-up regularly. 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 He had referred him to the lymphedema clinic and he was accepted but he did not follow-up with them. At that stage he had agreed to get a repeat arterial study done and he has seen Dr. Trula Slade in [place]. This time around he has been seen by his PCP Dr. Tiburcio Bash who referred him urgently to [place] for bilateral lower extremity cellulitis in spite of being on antibiotics -- he was placed on Keflex and 500 mg 3 times a day for 10 days. His comorbidities include coronary artery disease, diabetes mellitus, hypertension, Bell's palsy and chronic lymphedema. he was also recently admitted to the hospital on April 13 and kept overnight with the diagnosis of a TIA and a left ICA stenosis to follow-up with vascular surgery as an outpatient 10/05/2016 -- patient has been compliant this week and left his compression and dressing on for the entire week and had home health change it appropriately. 11/13/2016 -- his insurance will not cover compression stockings for lymphedema and hence we will have to send him to the lymphedema clinic as soon as we have finished with his healing. 11/20/2016 -- his lymphedema clinic appointment is still pending but other than that he has done very well and we are ready to transition him over. 11/27/16 patient's wounds appeared to be completely healed on evaluation today. He still has bilateral left edema and has been referred to the lymphedema clinic although we are still waiting to hear back from them on scheduling. Apparently she has to be discharged from home health services prior to being able to establish with the lymphedema  clinic. At this point considering he is completely healed from the  standpoint of wounds of the bilateral lower extremities I think it is appropriate to discharge him from home health and initiate weekly wraps on our end. There is no evidence of infection. Adam Merritt, Adam Merritt ([id number]) 01/01/2017 -- the patient was recently discharged on 12/04/2016 and asked to follow-up in the lymphedema clinic. However they seem to be some issues with his going to the lymphedema clinic and then referring him to dermatology for an opinion regarding his skin. Ultimately the patient did not have a follow-up and has now redeveloped wheezing from the right lower extremity and ulceration on the left lower extremity. The patient and his wife are rather helpless and do not seem to be very compliant with their care. 01/08/17 on evaluation today patient has just a single lower extremity wounds noted. Fortunately he is having no significant pain, no bowe or bladder dysfunction, no nausea and no vomiting. There's nothing to indicate a systemic worsening infection. 01/16/17 on a violation today patient appears to be doing fairly well in regard to his bilateral lower extremities. He has a very small ulcer which is barely openable left lower extremity and otherwise is completely closed in regard to both legs. I believe we may be ready to get him back to the lymphedema clinic at this point. ==== Old Notes: 71 year old gentleman who comes with bilateral lower limb edema for over 20 years and recent attack of blisters and redness for about 2 weeks. The patient says he has had bilateral lower limb edema since 1996. He has been noncompliant with wearing wraps in the past and even was given a boot to wear at night which she never tolerated and didn't wear it. He has a past medical history of sleep apnea, neuropathy with diabetes, hypertension, hyperlipidemia, coronary artery disease, varicose veins, carotid artery disease and is status post CABG and cholecystectomy. He was seen by the vascular  surgeon on 09/29/2013 and Dr. Trula Slade opinion was:"I have reviewed his outside ultrasound which shows an ankle-brachial index of 0.72 on the left and 1.0 on the right.o This study was repeated at our office.o The ankle-brachial index on the right was 1.26 with triphasic waveforms.o On the left it was a 1.04 with biphasic waveforms.o The patient had a toe pressure of 121 on the right and 100 on the left. In summary, I would not recommend any further imaging , as I do not think his symptoms are related to arterial insufficiency, but rather secondary to diabetic neuropathy.o The patient still has room for increasing his Neurontin dose, which he states does help him.o I have scheduled him to come back in one year for a repeat arterial evaluation." in June 2016, a year ago, he has a venous duplex study to be done. He was also going to be accepted to the lymphedema clinic but I did not believe the patient went regularly for this. ==== Electronic Signature(s) Signed: 01/17/2017 1:25:22 AM By: Worthy Keeler PA-C Entered By: Worthy Keeler on 01/16/2017 11:34:12 Adam Merritt ([id number]) -------------------------------------------------------------------------------- Physical Exam Details Patient Name: Adam Merritt Date of Service: 01/16/2017 11:00 AM Medical Record Number: [id number] Patient Account Number: [id number] Date of Birth/Sex: 01-27-1946 (71 y.o. Male) Treating RN: Montey Hora Primary Care Provider: [name] Other Clinician: Referring Provider: [name] Treating Provider/Extender: Melburn Hake,  [name]: 15 Constitutional Obese and well-hydrated in no acute distress. Respiratory normal breathing without difficulty. clear to auscultation bilaterally. Cardiovascular regular  rate and rhythm with normal S1, S2. 1+ pitting edema of the bilateral lower extremities. Psychiatric this patient is able to make decisions and demonstrates good insight into disease  process. Alert and Oriented x 3. pleasant and cooperative. Notes Patient's remaining wound is mostly capitalized with a very tiny area still open into pinpoint locations of the wound. I completely expect this to be close next week. Electronic Signature(s) Signed: 01/17/2017 1:25:22 AM By: Worthy Keeler PA-C Entered By: Worthy Keeler on 01/16/2017 11:34:49 Adam Merritt ([id number]) -------------------------------------------------------------------------------- Physician Orders Details Patient Name: Adam Merritt Date of Service: 01/16/2017 11:00 AM Medical Record Number: [id number] Patient Account Number: [id number] Date of Birth/Sex: 12-05-1945 (71 y.o. 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 o May shower with protection. Anesthetic Wound #4 Left,Distal,Medial Lower Leg o Topical Lidocaine 4% cream applied to wound bed prior to debridement Skin Barriers/Peri-Wound Care Wound #4 Left,Distal,Medial Lower Leg o Moisturizing lotion Primary Wound Dressing Wound #4 Left,Distal,Medial Lower Leg o Aquacel Ag Secondary Dressing Wound #4 Left,Distal,Medial Lower Leg o ABD pad Dressing Change Frequency Wound #4 Left,Distal,Medial Lower Leg o Change dressing every week Follow-up  Appointments Adam Merritt, GEHRES. ([id number]) Wound #4 Left,Distal,Medial Lower Leg o Return Appointment in 1 week. Edema Control Wound #4 Left,Distal,Medial Lower Leg o Kerlix and Coban - Bilateral - unna to anchor Additional Orders / Instructions Wound #4 Left,Distal,Medial Lower Leg o Increase protein intake. Electronic Signature(s) Signed: 01/16/2017 4:51:44 PM By: Montey Hora Signed: 01/17/2017 1:25:22 AM By: Worthy Keeler PA-C Entered By: Montey Hora on 01/16/2017 11:30:44 Adam Merritt ([id number]) -------------------------------------------------------------------------------- Problem List Details Patient Name: Adam Merritt Date of Service: 01/16/2017 11:00 AM Medical Record Number: [id number] Patient Account Number: [id number] Date of Birth/Sex: June 24, 1945 (71 y.o. 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  Male) Treating RN: Montey Hora Primary Care Provider: [name] Other Clinician: Referring Provider: [name] Treating Provider/Extender: Melburn Hake,  [name]: 15 Subjective Chief Complaint Information obtained from Patient the patient comes along with his wife for excessive swelling of both lower extremities left more than right and weeping of fluid History of Present Illness (  HPI) The following HPI elements were documented for the patient's wound: Location: bilateral lower leg edema with some blisters recently and weeping of fluid left side more than right Severity: Patient states wound are getting better Duration: Patient has had the wound for > 3 months prior to seeking treatment at the wound center Context: The wound appeared gradually over time Modifying Factors: Consults to this date include: he has been seen by his PCP and she sent him off to see [place]. Associated Signs and Symptoms: Patient reports having difficulty standing for long periods. 70 year old gentleman seen earlier at our clinic in June 2016 and again in June 2017 is noncompliant with recommendations and does not follow-up regularly. 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 He had referred him to the lymphedema clinic and he was accepted but he did not follow-up with them. At that stage he had agreed to get a repeat arterial study done and he has seen Dr. Trula Slade in [place]. This time around he has been seen by his PCP Dr. Tiburcio Bash who referred him urgently to [place] for bilateral lower extremity cellulitis in spite of being on antibiotics -- he was placed on Keflex and 500 mg 3 times a day for 10 days. His comorbidities include coronary artery disease, diabetes mellitus, hypertension,  Bell's palsy and chronic lymphedema. he was also recently admitted to the hospital on April 13 and kept overnight with the diagnosis of a TIA and a left ICA stenosis to follow-up with vascular surgery as an outpatient 10/05/2016 -- patient has been compliant this week and left his compression and dressing on for the entire week and had home health change it appropriately. 11/13/2016 -- his insurance will not cover compression stockings for lymphedema and hence we will have to send him to the lymphedema clinic as soon as we have finished with his healing. 11/20/2016 -- his lymphedema clinic appointment is still pending but other than that he has done very well and we are ready to transition him over. Adam Merritt, Adam Merritt ([id number]) 11/27/16 patient's wounds appeared to be completely healed on evaluation today. He still has bilateral left edema and has been referred to the lymphedema clinic although we are still waiting to hear back from them on scheduling. Apparently she has to be discharged from home health services prior to being able to establish with the lymphedema clinic. At this point considering he is completely healed from the standpoint of wounds of the bilateral lower extremities I think it is appropriate to discharge him from home health and initiate weekly wraps on our end. There is no evidence of infection. 01/01/2017 -- the patient was recently discharged on 12/04/2016 and asked to follow-up in the lymphedema clinic. However they seem to be some issues with his going to the lymphedema clinic and then referring him to dermatology for an opinion regarding his skin. Ultimately the patient did not have a follow-up and has now redeveloped wheezing from the right lower extremity and ulceration on the left lower extremity. The patient and his wife are rather helpless and do not seem to be very compliant with their care. 01/08/17 on evaluation today patient has just a single lower extremity  wounds noted. Fortunately he is having no significant pain, no bowe or bladder dysfunction, no  nausea and no vomiting. There's nothing to indicate a systemic worsening infection. 01/16/17 on a violation today patient appears to be doing fairly well in regard to his bilateral lower extremities. He has a very small ulcer which is barely openable left lower extremity and otherwise is completely closed in regard to both legs. I believe we may be ready to get him back to the lymphedema clinic at this point. ==== Old Notes: 71 year old gentleman who comes with bilateral lower limb edema for over 20 years and recent attack of blisters and redness for about 2 weeks. The patient says he has had bilateral lower limb edema since 1996. He has been noncompliant with wearing wraps in the past and even was given a boot to wear at night which she never tolerated and didn't wear it. He has a past medical history of sleep apnea, neuropathy with diabetes, hypertension, hyperlipidemia, coronary artery disease, varicose veins, carotid artery disease and is status post CABG and cholecystectomy. He was seen by the vascular surgeon on 09/29/2013 and Dr. Trula Slade opinion was:"I have reviewed his outside ultrasound which shows an ankle-brachial index of 0.72 on the left and 1.0 on the right. This study was repeated at our office. The ankle-brachial index on the right was 1.26 with triphasic waveforms. On the left it was a 1.04 with biphasic waveforms. The patient had a toe pressure of 121 on the right and 100 on the left. In summary, I would not recommend any further imaging , as I do not think his symptoms are related to arterial insufficiency, but rather secondary to diabetic neuropathy. The patient still has room for increasing his Neurontin dose, which he states does help him. I have scheduled him to come back in one year for a repeat arterial evaluation." in June 2016, a year ago, he has a venous duplex study to  be done. He was also going to be accepted to the lymphedema clinic but I did not believe the patient went regularly for this. ==== Adam Merritt, Adam Merritt. ([id number]) Objective Constitutional Obese and well-hydrated in no acute distress. Vitals Time Taken: 11:13 AM, Height: 65 in, Weight: 336 lbs, BMI: 55.9, Temperature: 98.1 F, Pulse: 79 bpm, Respiratory Rate: 18 breaths/min, Blood Pressure: 152/87 mmHg. Respiratory normal breathing without difficulty. clear to auscultation bilaterally. Cardiovascular regular rate and rhythm with normal S1, S2. 1+ pitting edema of the bilateral lower extremities. Psychiatric this patient is able to make decisions and demonstrates good insight into disease process. Alert and Oriented x 3. pleasant and cooperative. General Notes: Patient's remaining wound is mostly capitalized with a very tiny area still open into pinpoint locations of the wound. I completely expect this to be close next week. Integumentary (Hair, Skin) Wound #4 status is Open. Original cause of wound was Gradually Appeared. The wound is located on the Left,Distal,Medial Lower Leg. The wound measures 0.5cm length x 0.4cm width x 0.1cm depth; 0.157cm^2 area and 0.016cm^3 volume. There is Fat Layer (Subcutaneous Tissue) Exposed exposed. There is no tunneling or undermining noted. There is a large amount of serous drainage noted. The wound margin is flat and intact. There is medium (34-66%) red granulation within the wound bed. There is a medium (34-66%) amount of necrotic tissue within the wound bed including Adherent Slough. The periwound skin appearance did not exhibit: Callus, Crepitus, Excoriation, Induration, Rash, Scarring, Dry/Scaly, Maceration, Atrophie Blanche, Cyanosis, Ecchymosis, Hemosiderin Staining, Mottled, Pallor, Rubor, Erythema. Periwound temperature was noted as No Abnormality. 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 May shower with protection. Anesthetic: Wound #4 Left,Distal,Medial Lower Leg: Topical Lidocaine 4% cream applied to wound bed prior to debridement Skin Barriers/Peri-Wound Care: Wound #4 Left,Distal,Medial Lower Leg: Moisturizing lotion Primary Wound Dressing: Wound #4 Left,Distal,Medial Lower Leg: Aquacel Ag Secondary Dressing: Wound #4 Left,Distal,Medial Lower Leg: ABD pad Dressing Change Frequency: Wound #4 Left,Distal,Medial Lower Leg: Change dressing every week Follow-up Appointments: Wound #4 Left,Distal,Medial Lower Leg: Return Appointment in 1 week. Edema Control: Wound #4 Left,Distal,Medial Lower Leg: Kerlix and Coban - Bilateral - unna to anchor Additional Orders / Instructions: Wound #4 Left,Distal,Medial Lower Leg: Increase protein intake. I'm going to recommend that we continue with the Kerlex in [place] wraps for the next week. We will see him for reevaluation at that point ahead and make the referral back to the lymphedema clinic for him as well. If you have any other concerns in the meantime he will contact the office for additional recommendations. Otherwise we will see him for reevaluation in one weeks time. Electronic Signature(s) Adam Merritt, Adam Merritt ([id number]) Signed: 01/17/2017 1:25:22 AM By: Worthy Keeler PA-C Entered By: Worthy Keeler on 01/16/2017 11:35:33 Adam Merritt ([id number]) -------------------------------------------------------------------------------- SuperBill Details Patient Name: Adam Merritt Date of Service: 01/16/2017 Medical Record Number:  [id number] Patient Account Number: [id number] Date of Birth/Sex: 07-Jun-1945 (71 y.o. 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 Facility Procedures CPT4 Code: [id number] Description: 99213 - WOUND CARE VISIT-LEV 3 EST PT Modifier: Quantity: 1 Physician Procedures CPT4 Code Description: [id number] - WC PHYS LEVEL 3 - EST PT ICD-10 Description Diagnosis 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

## 2017-01-22 ENCOUNTER — Ambulatory Visit: Payer: Medicare Other | Admitting: Occupational Therapy

## 2017-01-23 ENCOUNTER — Ambulatory Visit: Payer: Medicare Other | Admitting: Occupational Therapy

## 2017-01-25 ENCOUNTER — Encounter: Payer: Medicare Other | Admitting: Surgery

## 2017-01-25 ENCOUNTER — Ambulatory Visit: Payer: Medicare Other | Admitting: Occupational Therapy

## 2017-01-25 DIAGNOSIS — E11620 Type 2 diabetes mellitus with diabetic dermatitis: Secondary | ICD-10-CM | POA: Diagnosis not present

## 2017-01-27 NOTE — Progress Notes (Signed)
Merritt, Adam ([id number]) Visit Report for 01/25/2017 Chief Complaint Document Details Patient Name: Adam Merritt, Adam Merritt Date of Service: 01/25/2017 12:00 PM Medical Record Number: [id number] Patient Account Number: [id number] Date of Birth/Sex: 1945-08-22 (71 y.o. Male) Treating RN: Montey Hora Primary Care Provider: [name] Other Clinician: Referring Provider: [name] Treating Provider/Extender: Frann Rider in Treatment: 17 Information Obtained from: Patient Chief Complaint the patient comes along with his wife for excessive swelling of both lower extremities left more than right and weeping of fluid Electronic Signature(s) Signed: 01/25/2017 3:24:30 PM By: Christin Fudge MD, FACS Entered By: Christin Fudge on 01/25/2017 12:28:13 Adam Merritt ([id number]) -------------------------------------------------------------------------------- HPI Details Patient Name: Adam Merritt Date of Service: 01/25/2017 12:00 PM Medical Record Number: [id number] Patient Account Number: [id number] Date of Birth/Sex: 04/13/1946 (71 y.o. Male) Treating RN: Montey Hora Primary Care Provider: [name] Other Clinician: Referring Provider: [name] Treating Provider/Extender: Frann Rider in Treatment: 17 History of Present Illness Location: bilateral lower leg edema with some blisters recently and weeping of fluid left side more than right Severity: Patient states wound are getting better Duration: Patient has had the wound for > 3 months prior to seeking treatment at the wound center Context: The wound appeared gradually over time Modifying Factors: Consults to this date include: he has been seen by his PCP and she sent him off to see [place]. Associated Signs and Symptoms: Patient reports having difficulty standing for long periods. HPI Description: 71 year old gentleman seen earlier at our clinic in June 2016 and again in June 2017 is noncompliant with recommendations and  does not follow-up regularly. 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 He had referred him to the lymphedema clinic and he was accepted but he did not follow-up with them. At that stage he had agreed to get a repeat arterial study done and he has seen Dr. Trula Slade in [place]. This time around he has been seen by his PCP Dr. Tiburcio Bash who referred him urgently to [place] for bilateral lower extremity cellulitis in spite of being on antibiotics -- he was placed on Keflex and 500 mg 3 times a day for 10 days. His comorbidities include coronary artery disease, diabetes mellitus, hypertension, Bell's palsy and chronic lymphedema. he was also recently admitted to the hospital on April 13 and kept overnight with the diagnosis of a TIA and a left ICA stenosis to follow-up with vascular surgery as an outpatient 10/05/2016 -- patient has been compliant this week and left his compression and dressing on for the entire week and had home health change it appropriately. 11/13/2016 -- his insurance will not cover compression stockings for lymphedema and hence we will have to send him to the lymphedema clinic as soon as we have finished with his healing. 11/20/2016 -- his lymphedema clinic appointment is still pending but other than that he has done very well and we are ready to transition him over. 11/27/16 patient's wounds appeared to be completely healed on evaluation today. He still has bilateral left edema and has been referred to the lymphedema clinic although we are still waiting to hear back from them on scheduling. Apparently she has to be discharged from home health services prior to being able to establish with the lymphedema clinic. At this  point considering he is completely healed from the  standpoint of wounds of the bilateral lower extremities I think it is appropriate to discharge him from home health and initiate weekly wraps on our end. There is no evidence of infection. Merritt, Adam ([id number]) 01/01/2017 -- the patient was recently discharged on 12/04/2016 and asked to follow-up in the lymphedema clinic. However they seem to be some issues with his going to the lymphedema clinic and then referring him to dermatology for an opinion regarding his skin. Ultimately the patient did not have a follow-up and has now redeveloped wheezing from the right lower extremity and ulceration on the left lower extremity. The patient and his wife are rather helpless and do not seem to be very compliant with their care. 01/08/17 on evaluation today patient has just a single lower extremity wounds noted. Fortunately he is having no significant pain, no bowe or bladder dysfunction, no nausea and no vomiting. There's nothing to indicate a systemic worsening infection. 01/16/17 on a violation today patient appears to be doing fairly well in regard to his bilateral lower extremities. He has a very small ulcer which is barely openable left lower extremity and otherwise is completely closed in regard to both legs. I believe we may be ready to get him back to the lymphedema clinic at this point. ==== Old Notes: 71 year old gentleman who comes with bilateral lower limb edema for over 20 years and recent attack of blisters and redness for about 2 weeks. The patient says he has had bilateral lower limb edema since 1996. He has been noncompliant with wearing wraps in the past and even was given a boot to wear at night which she never tolerated and didn't wear it. He has a past medical history of sleep apnea, neuropathy with diabetes, hypertension, hyperlipidemia, coronary artery disease, varicose veins, carotid artery disease and is status post CABG and cholecystectomy. He was seen by the vascular  surgeon on 09/29/2013 and Dr. Trula Slade opinion was:"I have reviewed his outside ultrasound which shows an ankle-brachial index of 0.72 on the left and 1.0 on the right.o This study was repeated at our office.o The ankle-brachial index on the right was 1.26 with triphasic waveforms.o On the left it was a 1.04 with biphasic waveforms.o The patient had a toe pressure of 121 on the right and 100 on the left. In summary, I would not recommend any further imaging , as I do not think his symptoms are related to arterial insufficiency, but rather secondary to diabetic neuropathy.o The patient still has room for increasing his Neurontin dose, which he states does help him.o I have scheduled him to come back in one year for a repeat arterial evaluation." in June 2016, a year ago, he has a venous duplex study to be done. He was also going to be accepted to the lymphedema clinic but I did not believe the patient went regularly for this. ==== Electronic Signature(s) Signed: 01/25/2017 3:24:30 PM By: Christin Fudge MD, FACS Entered By: Christin Fudge on 01/25/2017 12:28:24 Adam Merritt ([id number]) -------------------------------------------------------------------------------- Physical Exam Details Patient Name: Adam Merritt Date of Service: 01/25/2017 12:00 PM Medical Record Number: [id number] Patient Account Number: [id number] Date of Birth/Sex: 11/25/1945 (71 y.o. Male) Treating RN: Montey Hora Primary Care Provider: [name] Other Clinician: Referring Provider: [name] Treating Provider/Extender: Frann Rider in Treatment: 17 Constitutional . Pulse regular. Respirations normal and unlabored. Afebrile. . Eyes Nonicteric. Reactive to light. Ears, Nose, Mouth, and Throat Lips, teeth, and gums  WNL.. Moist mucosa without lesions. Neck supple and nontender. No palpable supraclavicular or cervical adenopathy. Normal sized without goiter. Respiratory WNL. No  retractions.. Cardiovascular Pedal Pulses WNL. No clubbing, cyanosis or edema. Lymphatic No adneopathy. No adenopathy. No adenopathy. Musculoskeletal Adexa without tenderness or enlargement.. Digits and nails w/o clubbing, cyanosis, infection, petechiae, ischemia, or inflammatory conditions.. Integumentary (Hair, Skin) No suspicious lesions. No crepitus or fluctuance. No peri-wound warmth or erythema. No masses.Marland Kitchen Psychiatric Judgement and insight Intact.. No evidence of depression, anxiety, or agitation.. Notes the left lower extremity continues to have signs of his chronic lymphedema and there is no open area of ulceration. Electronic Signature(s) Signed: 01/25/2017 3:24:30 PM By: Christin Fudge MD, FACS Entered By: Christin Fudge on 01/25/2017 12:28:51 Adam Merritt ([id number]) -------------------------------------------------------------------------------- Physician Orders Details Patient Name: Adam Merritt Date of Service: 01/25/2017 12:00 PM Medical Record Number: [id number] Patient Account Number: [id number] Date of Birth/Sex: 1945-06-20 (71 y.o. Male) Treating RN: Cornell Barman Primary Care Provider: [name] Other Clinician: Referring Provider: [name] Treating Provider/Extender: Frann Rider in Treatment: 37 Verbal / Phone Orders: No Diagnosis Coding Wound Cleansing Wound #4 Left,Distal,Medial Lower Leg o Clean wound with Normal Saline. o May shower with protection. Skin Barriers/Peri-Wound Care Wound #4 Left,Distal,Medial Lower Leg o Moisturizing lotion Dressing Change Frequency Wound #4 Left,Distal,Medial Lower Leg o Change dressing every week Follow-up Appointments Wound #4 Left,Distal,Medial Lower Leg o Return Appointment in 1 week. Edema Control Wound #4 Left,Distal,Medial Lower Leg o Kerlix and Coban - Left Lower Extremity - unna paste to anchor Additional Orders / Instructions Wound #4 Left,Distal,Medial Lower Leg o Increase  protein intake. Discharge From [facility] Wound #4 Left,Distal,Medial Lower Leg o Discharge from [place] - Treatment complete. Follow up with Lymphedema Clinic. Electronic Signature(s) Signed: 01/25/2017 3:24:30 PM By: Christin Fudge MD, FACS Signed: 01/25/2017 7:53:58 PM By: Gretta Cool, BSN, RN, CWS, Kim RN, BSN Entered By: Gretta Cool, BSN, RN, CWS, Kim on 01/25/2017 12:22:45 Adam Merritt ([id number]) KEMET, NIJJAR ([id number]) -------------------------------------------------------------------------------- Problem List Details Patient Name: MELBERT, BOTELHO Date of Service: 01/25/2017 12:00 PM Medical Record Number: [id number] Patient Account Number: [id number] Date of Birth/Sex: Jul 23, 1945 (71 y.o. 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 Male) Treating RN: Montey Hora Primary Care Provider: [name] Other  Clinician: Referring Provider: [name] Treating Provider/Extender: Frann Rider in Treatment: 17 Subjective Chief Complaint Information obtained from Patient the patient comes along with his wife for excessive swelling of both lower extremities left more than right and weeping of fluid History of Present Illness (HPI) The following HPI elements were documented for the patient's wound: Location: bilateral lower leg edema with some blisters recently and weeping of fluid left side more  than right Severity: Patient states wound are getting better Duration: Patient has had the wound for > 3 months prior to seeking treatment at the wound center Context: The wound appeared gradually over time Modifying Factors: Consults to this date include: he has been seen by his PCP and she sent him off to see [place]. Associated Signs and Symptoms: Patient reports having difficulty standing for long periods. 71 year old gentleman seen earlier at our clinic in June 2016 and again in June 2017 is noncompliant with recommendations and does not follow-up regularly. 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 He had referred him to the lymphedema clinic and he was accepted but he did not follow-up with them. At that stage he had agreed to get a repeat arterial study done and he has seen Dr. Trula Slade in [place]. This time around he has been seen by his PCP Dr. Tiburcio Bash who referred him urgently to [place] for bilateral lower extremity cellulitis in spite of being on antibiotics -- he was placed on Keflex and 500 mg 3 times a day for 10 days. His comorbidities include coronary artery disease, diabetes mellitus, hypertension, Bell's palsy and chronic lymphedema. he was also recently admitted to the hospital  on April 13 and kept overnight with the diagnosis of a TIA and a left ICA stenosis to follow-up with vascular surgery as an outpatient 10/05/2016 -- patient has been compliant this week and left his compression and dressing on for the entire week and had home health change it appropriately. 11/13/2016 -- his insurance will not cover compression stockings for lymphedema and hence we will have to send him to the lymphedema clinic as soon as we have finished with his healing. 11/20/2016 -- his lymphedema clinic appointment is still pending but other than that he has done very well and we are ready to transition him over. JAVAUN, DIMPERIO ([id number]) 11/27/16 patient's wounds appeared to be completely healed on evaluation today. He still has bilateral left edema and has been referred to the lymphedema clinic although we are still waiting to hear back from them on scheduling. Apparently she has to be discharged from home health services prior to being able to establish with the lymphedema clinic. At this point considering he is completely healed from the standpoint of wounds of the bilateral lower extremities I think it is appropriate to discharge him from home health and initiate weekly wraps on our end. There is no evidence of infection. 01/01/2017 -- the patient was recently discharged on 12/04/2016 and asked to follow-up in the lymphedema clinic. However they seem to be some issues with his going to the lymphedema clinic and then referring him to dermatology for an opinion regarding his skin. Ultimately the patient did not have a follow-up and has now redeveloped wheezing from the right lower extremity and ulceration on the left lower extremity. The patient and his wife are rather helpless and do not seem to be very compliant with their care. 01/08/17 on evaluation today patient has just a single lower extremity wounds noted. Fortunately he is having no significant pain, no bowe or bladder  dysfunction, no nausea and no vomiting. There's nothing to indicate a systemic worsening infection. 01/16/17 on a violation today patient appears to be doing fairly well in regard to  his bilateral lower extremities. He has a very small ulcer which is barely openable left lower extremity and otherwise is completely closed in regard to both legs. I believe we may be ready to get him back to the lymphedema clinic at this point. ==== Old Notes: 71 year old gentleman who comes with bilateral lower limb edema for over 20 years and recent attack of blisters and redness for about 2 weeks. The patient says he has had bilateral lower limb edema since 1996. He has been noncompliant with wearing wraps in the past and even was given a boot to wear at night which she never tolerated and didn't wear it. He has a past medical history of sleep apnea, neuropathy with diabetes, hypertension, hyperlipidemia, coronary artery disease, varicose veins, carotid artery disease and is status post CABG and cholecystectomy. He was seen by the vascular surgeon on 09/29/2013 and Dr. Trula Slade opinion was:"I have reviewed his outside ultrasound which shows an ankle-brachial index of 0.72 on the left and 1.0 on the right. This study was repeated at our office. The ankle-brachial index on the right was 1.26 with triphasic waveforms. On the left it was a 1.04 with biphasic waveforms. The patient had a toe pressure of 121 on the right and 100 on the left. In summary, I would not recommend any further imaging , as I do not think his symptoms are related to arterial insufficiency, but rather secondary to diabetic neuropathy. The patient still has room for increasing his Neurontin dose, which he states does help him. I have scheduled him to come back in one year for a repeat arterial evaluation." in June 2016, a year ago, he has a venous duplex study to be done. He was also going to be accepted to the lymphedema clinic but I did  not believe the patient went regularly for this. ==== LYNDELL, ALLAIRE. ([id number]) Objective Constitutional Pulse regular. Respirations normal and unlabored. Afebrile. Vitals Time Taken: 11:41 AM, Height: 65 in, Weight: 336 lbs, BMI: 55.9, Temperature: 98.1 F, Pulse: 79 bpm, Respiratory Rate: 16 breaths/min, Blood Pressure: 131/73 mmHg. Eyes Nonicteric. Reactive to light. Ears, Nose, Mouth, and Throat Lips, teeth, and gums WNL.Marland Kitchen Moist mucosa without lesions. Neck supple and nontender. No palpable supraclavicular or cervical adenopathy. Normal sized without goiter. Respiratory WNL. No retractions.. Cardiovascular Pedal Pulses WNL. No clubbing, cyanosis or edema. Lymphatic No adneopathy. No adenopathy. No adenopathy. Musculoskeletal Adexa without tenderness or enlargement.. Digits and nails w/o clubbing, cyanosis, infection, petechiae, ischemia, or inflammatory conditions.Marland Kitchen Psychiatric Judgement and insight Intact.. No evidence of depression, anxiety, or agitation.. General Notes: the left lower extremity continues to have signs of his chronic lymphedema and there is no open area of ulceration. Integumentary (Hair, Skin) No suspicious lesions. No crepitus or fluctuance. No peri-wound warmth or erythema. No masses.. Wound #4 status is Open. Original cause of wound was Gradually Appeared. The wound is located on the Left,Distal,Medial Lower Leg. The wound measures 0.1cm length x 0.1cm width x 0.1cm depth; 0.008cm^2 area and 0.001cm^3 volume. 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 May shower with protection. Skin Barriers/Peri-Wound Care: Wound #4 Left,Distal,Medial Lower Leg: Moisturizing  lotion Dressing Change Frequency: Wound #4 Left,Distal,Medial Lower Leg: Change dressing every week Follow-up Appointments: Wound #4 Left,Distal,Medial Lower Leg: Return Appointment in 1 week. Edema Control: Wound #4 Left,Distal,Medial Lower Leg: Kerlix and Coban - Left Lower Extremity - unna paste to anchor Additional Orders / Instructions: Wound #4 Left,Distal,Medial Lower Leg: Increase protein intake. Discharge From [facility]: Wound #4 Left,Distal,Medial Lower Leg: Discharge from [place] - Treatment complete. Follow up with Lymphedema Clinic. the patient's wounds have completely healed and I have discharged him from the wound care clinic. CAMRY, THEISS ([id number]) I have recommended: 1. foam over the areas recently healed and a light Kerlix and Coban dressing to both lower extremities. 2. elevation and exercise have been again reiterated 3. Good control of his diabetes mellitus 4. follow-up with the lymphedema clinic as soon as possible -- he and his wife have been urged to follow- up with them this week Electronic Signature(s) Signed: 01/25/2017 3:24:30 PM By: Christin Fudge MD, FACS Entered By: Christin Fudge on 01/25/2017 12:30:23 Adam Merritt ([id number]) -------------------------------------------------------------------------------- [place] Details Patient Name: Adam Merritt Date of Service: 01/25/2017 Medical Record Number: [id number] Patient Account Number: [id number] Date of Birth/Sex: 10/04/1945 (71 y.o. 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 Facility Procedures CPT4 Code: [id number] Description: 00174 - WOUND CARE VISIT-LEV 2 EST PT Modifier: Quantity: 1 Physician Procedures CPT4 Code Description: [id number] 91638 - WC PHYS LEVEL 3 - EST PT ICD-10 Description Diagnosis 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

## 2017-01-28 NOTE — Progress Notes (Signed)
[name], Adam ([id number]) Visit Report for 01/25/2017 Arrival Information Details Patient Name: Adam Merritt, Adam Merritt Date of Service: 01/25/2017 12:00 PM Medical Record Number: [id number] Patient Account Number: [id number] Date of Birth/Sex: 1946/01/31 (71 y.o. Male) Treating RN: Ahmed Prima Primary Care Syncere Kaminski: [name] Other Clinician: Referring Haskell Rihn: [name] Treating Camden Knotek/Extender: Frann Rider in Treatment: 72 Visit Information History Since Last Visit All ordered tests and consults were completed: No Patient Arrived: Adam Merritt Added or deleted any medications: No Arrival Time: 11:40 Any new allergies or adverse reactions: No Accompanied By: wife Had a fall or experienced change in No Transfer Assistance: None activities of daily living that may affect Patient Identification Verified: Yes risk of falls: Secondary Verification Process Yes Signs or symptoms of abuse/neglect since last No Completed: visito Patient Requires Transmission- No Hospitalized since last visit: No Based Precautions: Has Dressing in Place as Prescribed: No Patient Has Alerts: Yes Has Compression in Place as Prescribed: No Patient Alerts: DMII Pain Present Now: No ABI [place] BILATERAL >220 Electronic Signature(s) Signed: 01/26/2017 4:07:04 PM By: Alric Quan Entered By: Alric Quan on 01/25/2017 11:40:58 Adam Merritt ([id number]) -------------------------------------------------------------------------------- Clinic Level of Care Assessment Details Patient Name: Adam Merritt Date of Service: 01/25/2017 12:00 PM Medical Record Number: [id number] Patient Account Number: [id number] Date of Birth/Sex: 1945/10/17 (71 y.o. Male) Treating RN: Cornell Barman Primary Care Keyonni Percival: [name] Other Clinician: Referring Kaleb Linquist: [name] Treating Marte Celani/Extender: Frann Rider in Treatment: 17 Clinic Level of Care Assessment Items TOOL 4 Quantity Score []  - Use  when only an EandM is performed on FOLLOW-UP visit 0 ASSESSMENTS - Nursing Assessment / Reassessment []  - Reassessment of Co-morbidities (includes updates in patient status) 0 X - Reassessment of Adherence to Treatment Plan 1 5 ASSESSMENTS - Wound and Skin Assessment / Reassessment X - Simple Wound Assessment / Reassessment - one wound 1 5 []  - Complex Wound Assessment / Reassessment - multiple wounds 0 []  - Dermatologic / Skin Assessment (not related to wound area) 0 ASSESSMENTS - Focused Assessment []  - Circumferential Edema Measurements - multi extremities 0 []  - Nutritional Assessment / Counseling / Intervention 0 []  - Lower Extremity Assessment (monofilament, tuning fork, pulses) 0 []  - Peripheral Arterial Disease Assessment (using hand held doppler) 0 ASSESSMENTS - Ostomy and/or Continence Assessment and Care []  - Incontinence Assessment and Management 0 []  - Ostomy Care Assessment and Management (repouching, etc.) 0 PROCESS - Coordination of Care X - Simple Patient / Family Education for ongoing care 1 15 []  - Complex (extensive) Patient / Family Education for ongoing care 0 []  - Staff obtains Programmer, systems, Records, Test Results / Process Orders 0 []  - Staff telephones HHA, Nursing Homes / Clarify orders / etc 0 []  - Routine Transfer to another Facility (non-emergent condition) 0 Adam, Merritt ([id number]) []  - Routine Hospital Admission (non-emergent condition) 0 []  - New Admissions / Biomedical engineer / Ordering NPWT, Apligraf, etc. 0 []  - Emergency Hospital Admission (emergent condition) 0 X - Simple Discharge Coordination 1 10 []  - Complex (extensive) Discharge Coordination 0 PROCESS - Special Needs []  - Pediatric / Minor Patient Management 0 []  - Isolation Patient Management 0 []  - Hearing / Language / Visual special needs 0 []  - Assessment of Community assistance (transportation, D/C planning, etc.) 0 []  - Additional assistance / Altered mentation 0 []  - Support  Surface(s) Assessment (bed, cushion, seat, etc.) 0 INTERVENTIONS - Wound Cleansing / Measurement X - Simple Wound Cleansing - one wound 1 5 []  -  Complex Wound Cleansing - multiple wounds 0 X - Wound Imaging (photographs - any number of wounds) 1 5 []  - Wound Tracing (instead of photographs) 0 X - Simple Wound Measurement - one wound 1 5 []  - Complex Wound Measurement - multiple wounds 0 INTERVENTIONS - Wound Dressings []  - Small Wound Dressing one or multiple wounds 0 X - Medium Wound Dressing one or multiple wounds 1 15 []  - Large Wound Dressing one or multiple wounds 0 []  - Application of Medications - topical 0 []  - Application of Medications - injection 0 INTERVENTIONS - Miscellaneous []  - External ear exam 0 Adam, Merritt ([id number]) []  - Specimen Collection (cultures, biopsies, blood, body fluids, etc.) 0 []  - Specimen(s) / Culture(s) sent or taken to Lab for analysis 0 []  - Patient Transfer (multiple staff / Harrel Lemon Lift / Similar devices) 0 []  - Simple Staple / Suture removal (25 or less) 0 []  - Complex Staple / Suture removal (26 or more) 0 []  - Hypo / Hyperglycemic Management (close monitor of Blood Glucose) 0 []  - Ankle / Brachial Index (ABI) - do not check if billed separately 0 X - Vital Signs 1 5 Has the patient been seen at the hospital within the last three years: Yes Total Score: 70 Level Of Care: New/Established - Level 2 Electronic Signature(s) Signed: 01/25/2017 7:53:58 PM By: Adam Merritt, BSN, RN, CWS, Kim RN, BSN Entered By: Adam Merritt, BSN, RN, CWS, Kim on 01/25/2017 12:29:53 Adam Merritt ([id number]) -------------------------------------------------------------------------------- Encounter Discharge Information Details Patient Name: Adam Merritt Date of Service: 01/25/2017 12:00 PM Medical Record Number: [id number] Patient Account Number: [id number] Date of Birth/Sex: 1946-02-28 (71 y.o. Male) Treating RN: Montey Hora Primary Care Adam Merritt: [name] Other  Clinician: Referring Ozzie Remmers: [name] Treating Fabricio Endsley/Extender: Frann Rider in Treatment: 50 Encounter Discharge Information Items Discharge Pain Level: 0 Discharge Condition: Stable Ambulatory Status: Ambulatory Discharge Destination: Home Transportation: Private Auto Accompanied By: wife Schedule Follow-up Appointment: Yes Medication Reconciliation completed and provided to Patient/Care Yes Nyles Mitton: Provided on Clinical Summary of Care: 01/25/2017 Form Type Recipient Paper Patient JT Electronic Signature(s) Signed: 01/25/2017 7:53:58 PM By: Adam Merritt, BSN, RN, CWS, Kim RN, BSN Entered By: Adam Merritt, BSN, RN, CWS, Kim on 01/25/2017 12:31:05 Adam Merritt ([id number]) -------------------------------------------------------------------------------- Lower Extremity Assessment Details Patient Name: Adam Merritt Date of Service: 01/25/2017 12:00 PM Medical Record Number: [id number] Patient Account Number: [id number] Date of Birth/Sex: 1946/05/19 (71 y.o. Male) Treating RN: Ahmed Prima Primary Care Latiqua Daloia: [name] Other Clinician: Referring Alawna Graybeal: [name] Treating Vaneza Pickart/Extender: Frann Rider in Treatment: 17 Edema Assessment Assessed: [Left: No] [Right: No] E[Left: dema] [Right: :] Calf Left: Right: Point of Measurement: 34 cm From Medial Instep 55 cm 50 cm Ankle Left: Right: Point of Measurement: 12 cm From Medial Instep 34.5 cm 30 cm Vascular Assessment Pulses: Dorsalis Pedis Palpable: [Left:Yes] [Right:Yes] Posterior Tibial Extremity colors, hair growth, and conditions: Extremity Color: [Left:Hyperpigmented] [Right:Hyperpigmented] Temperature of Extremity: [Left:Warm] [Right:Warm] Capillary Refill: [Left:< 3 seconds] [Right:< 3 seconds] Toe Nail Assessment Left: Right: Thick: Yes Yes Discolored: Yes Yes Deformed: No No Improper Length and Hygiene: Yes Yes Electronic Signature(s) Signed: 01/26/2017 4:07:04 PM By: Alric Quan Entered By: Alric Quan on 01/25/2017 11:51:04 Adam Merritt ([id number]) -------------------------------------------------------------------------------- Multi Wound Chart Details Patient Name: Adam Merritt Date of Service: 01/25/2017 12:00 PM Medical Record Number: [id number] Patient Account Number: [id number] Date of Birth/Sex: 1945-09-16 (71 y.o. Male) Treating RN: Cornell Barman Primary Care Laynee Lockamy: [name] Other Clinician: Referring Jackilyn Umphlett: Humphrey Rolls,  FOZIA Treating Rooney Gladwin/Extender: Frann Rider in Treatment: 17 Vital Signs Height(in): 65 Pulse(bpm): 79 Weight(lbs): 336 Blood Pressure 131/73 (mmHg): Body Mass Index(BMI): 56 Temperature(F): 98.1 Respiratory Rate 16 (breaths/min): Photos: [N/A:N/A] Wound Location: Left, Distal, Medial Lower N/A N/A Leg Wounding Event: Gradually Appeared N/A N/A Primary Etiology: Lymphedema N/A N/A Date Acquired: 12/25/2016 N/A N/A Weeks of Treatment: 3 N/A N/A Wound Status: Open N/A N/A Measurements L x W x D 0.1x0.1x0.1 N/A N/A (cm) Area (cm) : 0.008 N/A N/A Volume (cm) : 0.001 N/A N/A % Reduction in Area: 99.20% N/A N/A % Reduction in Volume: 99.50% N/A N/A Classification: Full Thickness Without N/A N/A Exposed Support Structures Periwound Skin Texture: No Abnormalities Noted N/A N/A Periwound Skin No Abnormalities Noted N/A N/A Moisture: Periwound Skin Color: No Abnormalities Noted N/A N/A Tenderness on No N/A N/A Palpation: Treatment Notes JARRIN, STALEY ([id number]) Electronic Signature(s) Signed: 01/25/2017 3:24:30 PM By: Christin Fudge MD, FACS Entered By: Christin Fudge on 01/25/2017 12:28:06 Adam Merritt ([id number]) -------------------------------------------------------------------------------- [place] Details Patient Name: Adam Merritt Date of Service: 01/25/2017 12:00 PM Medical Record Number: [id number] Patient Account Number: [id number] Date of Birth/Sex:  [birth year] (71 y.o. Male) Treating RN: Cornell Barman Primary Care Timberlyn Pickford: [name] Other Clinician: Referring [place]: [name] Treating Laquanna Veazey/Extender: Frann Rider in Treatment: 88 Active Inactive Electronic Signature(s) Signed: 01/25/2017 7:53:58 PM By: Adam Merritt, BSN, RN, CWS, Kim RN, BSN Entered By: Adam Merritt, BSN, RN, CWS, Kim on 01/25/2017 12:20:42 Adam Merritt ([id number]) -------------------------------------------------------------------------------- Pain Assessment Details Patient Name: Adam Merritt Date of Service: 01/25/2017 12:00 PM Medical Record Number: [id number] Patient Account Number: [id number] Date of Birth/Sex: 1946/03/25 (71 y.o. Male) Treating RN: Ahmed Prima Primary Care Donold Marotto: [name] Other Clinician: Referring Tiffanyann Deroo: [name] Treating Kewan Mcnease/Extender: Frann Rider in Treatment: 17 Active Problems Location of Pain Severity and Description of Pain Patient Has Paino No Site Locations Pain Management and Medication Current Pain Management: Electronic Signature(s) Signed: 01/26/2017 4:07:04 PM By: Alric Quan Entered By: Alric Quan on 01/25/2017 11:41:03 Adam Merritt ([id number]) -------------------------------------------------------------------------------- Wound Assessment Details Patient Name: Adam Merritt Date of Service: 01/25/2017 12:00 PM Medical Record Number: [id number] Patient Account Number: [id number] Date of Birth/Sex: 08/30/1945 (71 y.o. Male) Treating RN: Cornell Barman Primary Care Deandre Stansel: [name] Other Clinician: Referring Paeton Studer: [name] Treating Sebastian Dzik/Extender: Frann Rider in Treatment: 17 Wound Status Wound Number: 4 Primary Etiology: Lymphedema Wound Location: Left, Distal, Medial Lower Leg Wound Status: Open Wounding Event: Gradually Appeared Date Acquired: 12/25/2016 Weeks Of Treatment: 3 Clustered Wound: No Photos Photo Uploaded By: Adam Merritt, BSN, RN,  CWS, Kim on 01/25/2017 11:47:44 Wound Measurements Length: (cm) 0.1 Width: (cm) 0.1 Depth: (cm) 0.1 Area: (cm) 0.008 Volume: (cm) 0.001 % Reduction in Area: 99.2% % Reduction in Volume: 99.5% Wound Description Full Thickness Without Exposed Classification: Support Structures Periwound Skin Texture Texture Color No Abnormalities Noted: No No Abnormalities Noted: No Moisture No Abnormalities Noted: No Electronic Signature(s) Signed: 01/25/2017 7:53:58 PM By: Adam Merritt, BSN, RN, CWS, Kim RN, BSN Entered By: Adam Merritt, BSN, RN, CWS, Kim on 01/25/2017 11:47:00 Adam Merritt ([id number]) JAKHARI, SPACE ([id number]) -------------------------------------------------------------------------------- [place] Details Patient Name: Adam Merritt Date of Service: 01/25/2017 12:00 PM Medical Record Number: [id number] Patient Account Number: [id number] Date of Birth/Sex: 1945/10/07 (71 y.o. Male) Treating RN: Ahmed Prima Primary Care Aadin Gaut: [name] Other Clinician: Referring Afnan Cadiente: [name] Treating Khoen Genet/Extender: Frann Rider in Treatment: 17 Vital Signs Time Taken: 11:41 Temperature (F): 98.1 Height (in): 65  Pulse (bpm): 79 Weight (lbs): 336 Respiratory Rate (breaths/min): 16 Body Mass Index (BMI): 55.9 Blood Pressure (mmHg): 131/73 Reference Range: 80 - 120 mg / dl Electronic Signature(s) Signed: 01/26/2017 4:07:04 PM By: Alric Quan Entered By: Alric Quan on 01/25/2017 11:42:38

## 2017-01-29 ENCOUNTER — Ambulatory Visit: Payer: Medicare Other | Admitting: Occupational Therapy

## 2017-01-30 ENCOUNTER — Ambulatory Visit: Payer: Medicare Other | Admitting: Occupational Therapy

## 2017-02-01 ENCOUNTER — Encounter: Payer: Medicare Other | Admitting: Occupational Therapy

## 2017-02-06 ENCOUNTER — Encounter: Payer: Medicare Other | Admitting: Occupational Therapy

## 2017-02-08 ENCOUNTER — Encounter: Payer: Medicare Other | Admitting: Occupational Therapy

## 2017-02-12 ENCOUNTER — Encounter: Payer: Medicare Other | Admitting: Occupational Therapy

## 2017-02-13 ENCOUNTER — Encounter: Payer: Medicare Other | Admitting: Occupational Therapy

## 2017-02-15 ENCOUNTER — Encounter: Payer: Medicare Other | Admitting: Occupational Therapy

## 2017-02-19 ENCOUNTER — Ambulatory Visit: Payer: Medicare Other | Attending: Surgery | Admitting: Occupational Therapy

## 2017-02-19 ENCOUNTER — Encounter: Payer: Medicare Other | Admitting: Occupational Therapy

## 2017-02-19 DIAGNOSIS — I890 Lymphedema, not elsewhere classified: Secondary | ICD-10-CM | POA: Diagnosis not present

## 2017-02-19 NOTE — Patient Instructions (Signed)

## 2017-02-19 NOTE — Therapy (Signed)
[place] MAIN [facility] SERVICES [address] [place], [place], [address] Phone: [phone]   Fax:  [phone]  Occupational Therapy Treatment  Patient Details  Name: Adam Merritt MRN: [id number] Date of Birth: [birth year] No Data Recorded  Encounter Date: 02/19/2017      OT End of Session - 02/19/17 1219    Visit Number 2   Number of Visits 36   Date for OT Re-Evaluation 03/12/17   OT Start Time 1050   OT Stop Time 1200   OT Time Calculation (min) 70 min   Activity Tolerance Patient tolerated treatment well;No increased pain   Behavior During Therapy WFL for tasks assessed/performed      Past Medical History:  Diagnosis Date  . Anemia   . CAD (coronary artery disease)   . Chest pain   . Coronary artery disease   . Diabetes mellitus without complication ([place])   . Esophageal reflux   . Herpes zoster without mention of complication   . Hyperlipidemia   . Hypertension   . Insomnia   . Lumbago   . Neuropathy in diabetes ([place])   . Obstructive chronic bronchitis without exacerbation ([place])   . Other malaise and fatigue   . Stroke ([place])   . Varicose veins     Past Surgical History:  Procedure Laterality Date  . CHOLECYSTECTOMY    . CORONARY ARTERY BYPASS GRAFT      There were no vitals filed for this visit.      Subjective Assessment - 02/19/17 1050    Subjective  Pt returns to OT today for CDT to BLE, LLE initially and then L. Pt was last seen for 2nd LE evaluation on 12/12/16. He has been undergoing LE wound care treatment during the interval. Pt is accompanied by his spouse, Bethena Roys, who is in agreement with plan to learn to assist Pt with daily LE self care home program,  to include compression wrapping, skin care, ther ex and  simple self-MLD, Pt reports BLE foot pain 7-8/10 2/2 nperiferal neuropathy. "I forgot to put a pain patch on this morning before we left."   Patient is accompained by: Family member   Pertinent History  DM, OSA, CAROTID artery disease, diabetic neuropathy, Hx TIA, recent Dopler negative for DVT, hx recurrent cellulitis, Obesity, CVI   Limitations difficulty walking, impaired functional mobility, difficulty bathing feet and legs, difficulty fitting lower body clothing and street shoes;  difficulty donning preferred shoes and socks;    Repetition Increases Symptoms   Patient Stated Goals Get my legs better so I dont have to worry about losing them all the time.    Currently in Pain? Yes   Pain Score 7    Pain Location Foot   Pain Orientation Right;Left   Pain Descriptors / Indicators Aching;Jabbing;Sore;Constant;Burning;Numbness;Cramping;Penetrating;Throbbing;Tightness;Pins and needles;Discomfort;Tingling;Dull;Tiring;Heaviness   Pain Type Chronic pain;Neuropathic pain   Pain Onset Other (comment)   Pain Frequency Constant   Aggravating Factors  standing, walking, prolonged sitting   Pain Relieving Factors meds,    Effect of Pain on Daily Activities limits ambulation and functional mobility, limits all basic and instrumental ADLs, limits productive activities performance and leisure pusuits, impairs social participation              LYMPHEDEMA/ONCOLOGY QUESTIONNAIRE - 02/19/17 1209      Lymphedema Assessments   Lymphedema Assessments Lower extremities     Right Lower Extremity Lymphedema   Other RLE A-D =6357.71 ml     Left Lower  Extremity Lymphedema   Other LLE A-D limb volume = 8069.35 ml   Other limb volume diffierential (LVD) = 21.21%, L>R                 OT Treatments/Exercises (OP) - 02/19/17 0001      ADLs   ADL Education Given Yes     Manual Therapy   Manual Therapy Edema management;Compression Bandaging   Edema Management BLE comparative limb volumetrics- from ankle to tibial tuberosity (A-D)                     OT Long Term Goals - 12/12/16 1438      OT LONG TERM GOAL #1   Title Pt independent w/ lymphedema precautions and prevention  principals and strategies to limit LE progression and infection risk using printed handout for reference to limit lymphedema progression and infection risk.   Baseline dependent   Time 1   Period Weeks   Status New     OT LONG TERM GOAL #2   Title Lymphedema (LE) management/ self-care: Pt able to apply knee length, multi layered,  gradient compression wraps with maximum caregiver assistance using proper techniques within 2 weeks to achieve optimal limb volume reductions bilaterally.   Baseline dependent   Time 2   Period Weeks   Status New     OT LONG TERM GOAL #3   Title Lymphedema (LE) management/ self-care:  Pt to achieve at least 10% limb volume reduction  bilaterally below the knees during Intensive Phase CDT to limit progression, to reduce leg pain, and to improve ADLs performance, and to facilitate safe functional mobility and ambulation.   Baseline dependent   Time 12   Period Weeks   Status New     OT LONG TERM GOAL #4   Title Lymphedema (LE) management/ self-care:  Pt to tolerate daily compression wraps, compression garments and/ or HOS devices in keeping w/ prescribed wear regime within 1 week of issue date of each to progress and retain clinical and functional gains and to limit LE progression.   Baseline dependent   Time 12   Period Weeks     OT LONG TERM GOAL #5   Title Lymphedema (LE) management/ self-care:  During Management Phase CDT Pt to sustain current limb volumes within 5%, and all other clinical gains achieved during OT treatment with needed level of caregiver assistance to limit LE progression, infection risk and further functional decline.   Baseline dependent   Time 6   Period Months   Status New     Long Term Additional Goals   Additional Long Term Goals Yes     OT LONG TERM GOAL #6   Title Pt to remain wound and nfection free in BLE for 6 months to limit lymphedema progression , to limit non-heealing leg wounds , and to limit increased difficulty w/  functional ambulation and mobility needed for optimal ADLs performance   Baseline dependent   Time 6   Period Weeks   Status New               Plan - 02/19/17 1212    Clinical Impression Statement Initial BLE comparative limb volumetrics reveal limb volume diffierential (LVD)  measuring 21.21%, L>R below the knees (A-=D). Pt tolerated knee length compression application using shourt stretch wraps. Spouse asked for clarification orepeatedly on wear and care instructions. No Pt or spouse edu on application  steps for wraps as Iit is anticipated that they  will need extra time to learn proper gradient technique, plenty of opportunities to practice, and frequent review and demonstration until skills are mastered for self care ,during visit intervals.   Occupational performance deficits (Please refer to evaluation for details): ADL's;IADL's;Rest and Sleep;Education;Play;Leisure;Social Participation;Other  functional ambulation , transfers and bed mobility   Rehab Potential Good   Current Impairments/barriers affecting progress: difficulty reaching distal legs and feet for skin infection, dressing, bathing, nail care and skin care. ZUnable to apply compression wraps himself. Will require max caregiver assist for optimal outcome. Deep tissue induration may limit clinical response to CDT. Hx of wounds and  recurrent infection  Falls risk   OT Frequency 3x / week   OT Duration 12 weeks   OT Treatment/Interventions Self-care/ADL training;Therapeutic exercise;Patient/family education;Manual Therapy;Manual lymph drainage;Therapeutic exercises;DME and/or AE instruction;Compression bandaging;Other (comment)  skin care with low PH lotion   or castor oil during MLD   Consulted and Agree with Plan of Care Patient;Family member/caregiver      Patient will benefit from skilled therapeutic intervention in order to improve the following deficits and impairments:  Decreased endurance, Decreased skin integrity,  Decreased knowledge of precautions, Decreased scar mobility, Impaired perceived functional ability, Improper body mechanics, Decreased activity tolerance, Decreased knowledge of use of DME, Impaired flexibility, Decreased balance, Decreased mobility, Difficulty walking, Impaired sensation, Obesity, Decreased range of motion, Increased edema, Pain, Other (comment) (increased fall risk; increased infection risk; increased risk non-healing wounds)  Visit Diagnosis: Lymphedema, not elsewhere classified    Problem List Patient Active Problem List   Diagnosis Date Noted  . CVA (cerebral vascular accident) ([place]) 09/15/2016  . CVA (cerebral infarction) 02/12/2015  . Peripheral vascular disease, unspecified ([place]) 09/29/2013   Andrey Spearman, MS, OTR/L, [facility] 02/19/17 12:21 PM  [place] MAIN [facility] SERVICES [address] [place], [place], [address] Phone: [phone]   Fax:  [phone]  Name: Adam Merritt MRN: [id number] Date of Birth: Nov 19, 1945

## 2017-02-20 ENCOUNTER — Encounter: Payer: Medicare Other | Admitting: Occupational Therapy

## 2017-02-20 ENCOUNTER — Ambulatory Visit: Payer: Medicare Other | Admitting: Occupational Therapy

## 2017-02-20 DIAGNOSIS — I890 Lymphedema, not elsewhere classified: Secondary | ICD-10-CM | POA: Diagnosis not present

## 2017-02-20 NOTE — Therapy (Signed)
[place] MAIN [facility] SERVICES [address] [place], [place], [address] Phone: [phone]   Fax:  [phone]  Occupational Therapy Treatment  Patient Details  Name: Adam Merritt MRN: [id number] Date of Birth: May 24, 1946 No Data Recorded  Encounter Date: 02/20/2017      OT End of Session - 02/20/17 1230    Visit Number 3   Number of Visits 36   Date for OT Re-Evaluation 03/12/17   OT Start Time 1100   OT Stop Time 1145   OT Time Calculation (min) 45 min   Activity Tolerance Patient tolerated treatment well;No increased pain   Behavior During Therapy WFL for tasks assessed/performed      Past Medical History:  Diagnosis Date  . Anemia   . CAD (coronary artery disease)   . Chest pain   . Coronary artery disease   . Diabetes mellitus without complication ([place])   . Esophageal reflux   . Herpes zoster without mention of complication   . Hyperlipidemia   . Hypertension   . Insomnia   . Lumbago   . Neuropathy in diabetes ([place])   . Obstructive chronic bronchitis without exacerbation ([place])   . Other malaise and fatigue   . Stroke ([place])   . Varicose veins     Past Surgical History:  Procedure Laterality Date  . CHOLECYSTECTOMY    . CORONARY ARTERY BYPASS GRAFT      There were no vitals filed for this visit.      Subjective Assessment - 02/20/17 1225    Subjective  Pt returns for OT  visit 3/ 36 for CDT to BLE, Pt is accompanied by his spouse and 57 month old grandson. Pt presents with knee length RLE compression wraps in place. Pt states he had no trouble tolerating compression wraps over night.   Patient is accompained by: Family member   Pertinent History DM, OSA, CAROTID artery disease, diabetic neuropathy, Hx TIA, recent Dopler negative for DVT, hx recurrent cellulitis, Obesity, CVI   Limitations difficulty walking, impaired functional mobility, difficulty bathing feet and legs, difficulty fitting lower body clothing and  street shoes;  difficulty donning preferred shoes and socks;    Repetition Increases Symptoms   Patient Stated Goals Get my legs better so I dont have to worry about losing them all the time.    Currently in Pain? No/denies   Pain Onset Other (comment)             LYMPHEDEMA/ONCOLOGY QUESTIONNAIRE - 02/19/17 1209      Lymphedema Assessments   Lymphedema Assessments Lower extremities     Right Lower Extremity Lymphedema   Other RLE A-D =6357.71 ml     Left Lower Extremity Lymphedema   Other LLE A-D limb volume = 8069.35 ml   Other limb volume diffierential (LVD) = 21.21%, L>R                 OT Treatments/Exercises (OP) - 02/20/17 0001      ADLs   ADL Education Given Yes     Manual Therapy   Manual Therapy Edema management;Compression Bandaging   Compression Bandaging Ankle to below Knee (A-D)  LLE gradient compression wraps applied from foot to below knee as follows: Toe wrap omitted as not indicated. . Knee length cotton stockinett from base of toes to knee. One roll Rasidol soft foam. cumferentially from foot to tibila tuberosity w/ ~ 50% overlap; single 8 cm x 5 m  to foot and ankle,  one 12 cm  x 5 m ankle to achilles origin...all applied circumferentially in custommary layered gradient configuration. Fitted w/ 3 x non-slip sock as Pt able to fit shoe over wraps.                OT Education - 02/20/17 1229    Education provided Yes   Education Details Provided Pt and spouse edu throughout session for gradient compression wrapping with short stretch compression wraps.   Person(s) Educated Patient;Spouse   Methods Explanation;Demonstration;Tactile cues;Verbal cues;Handout   Comprehension Verbalized understanding;Returned demonstration;Verbal cues required;Tactile cues required;Need further instruction             OT Long Term Goals - 12/12/16 1438      OT LONG TERM GOAL #1   Title Pt independent w/ lymphedema precautions and prevention  principals and strategies to limit LE progression and infection risk using printed handout for reference to limit lymphedema progression and infection risk.   Baseline dependent   Time 1   Period Weeks   Status New     OT LONG TERM GOAL #2   Title Lymphedema (LE) management/ self-care: Pt able to apply knee length, multi layered,  gradient compression wraps with maximum caregiver assistance using proper techniques within 2 weeks to achieve optimal limb volume reductions bilaterally.   Baseline dependent   Time 2   Period Weeks   Status New     OT LONG TERM GOAL #3   Title Lymphedema (LE) management/ self-care:  Pt to achieve at least 10% limb volume reduction  bilaterally below the knees during Intensive Phase CDT to limit progression, to reduce leg pain, and to improve ADLs performance, and to facilitate safe functional mobility and ambulation.   Baseline dependent   Time 12   Period Weeks   Status New     OT LONG TERM GOAL #4   Title Lymphedema (LE) management/ self-care:  Pt to tolerate daily compression wraps, compression garments and/ or HOS devices in keeping w/ prescribed wear regime within 1 week of issue date of each to progress and retain clinical and functional gains and to limit LE progression.   Baseline dependent   Time 12   Period Weeks     OT LONG TERM GOAL #5   Title Lymphedema (LE) management/ self-care:  During Management Phase CDT Pt to sustain current limb volumes within 5%, and all other clinical gains achieved during OT treatment with needed level of caregiver assistance to limit LE progression, infection risk and further functional decline.   Baseline dependent   Time 6   Period Months   Status New     Long Term Additional Goals   Additional Long Term Goals Yes     OT LONG TERM GOAL #6   Title Pt to remain wound and nfection free in BLE for 6 months to limit lymphedema progression , to limit non-heealing leg wounds , and to limit increased difficulty w/  functional ambulation and mobility needed for optimal ADLs performance   Baseline dependent   Time 6   Period Weeks   Status New               Plan - 02/20/17 1230    Clinical Impression Statement Pt's LLE is dramatically decreased in limb volume and limb density is notably decreased since last visit . Emphasis of today's visit on Pt adu for LE self care, including lymphatic pumping ther ex and gradient compression wrapping. After skilled training Pt able to perform  LE ther ex with min A. By end of session spouse needed max A to apply compression wraps. Cont as per POC. Emphasis next session on compression wrapping and LE ADL training until mastered we won't commence MLD to ensure clinical gains will be sustained over time .   Occupational performance deficits (Please refer to evaluation for details): ADL's;IADL's;Rest and Sleep;Work;Leisure;Social Participation   Rehab Potential Good   Current Impairments/barriers affecting progress: difficulty reaching distal legs and feet for skin infection, dressing, bathing, nail care and skin care. ZUnable to apply compression wraps himself. Will require max caregiver assist for optimal outcome. Deep tissue induration may limit clinical response to CDT. Hx of wounds and  recurrent infection  Falls risk   OT Frequency 3x / week   OT Duration 12 weeks   OT Treatment/Interventions Self-care/ADL training;Therapeutic exercise;Patient/family education;Manual Therapy;Manual lymph drainage;Therapeutic exercises;DME and/or AE instruction;Compression bandaging;Other (comment)  skin care with low PH lotion   or castor oil during MLD   Consulted and Agree with Plan of Care Patient;Family member/caregiver      Patient will benefit from skilled therapeutic intervention in order to improve the following deficits and impairments:  Decreased endurance, Decreased skin integrity, Decreased knowledge of precautions, Decreased scar mobility, Impaired perceived  functional ability, Improper body mechanics, Decreased activity tolerance, Decreased knowledge of use of DME, Impaired flexibility, Decreased balance, Decreased mobility, Difficulty walking, Impaired sensation, Obesity, Decreased range of motion, Increased edema, Pain, Other (comment) (increased fall risk; increased infection risk; increased risk non-healing wounds)  Visit Diagnosis: Lymphedema, not elsewhere classified    Problem List Patient Active Problem List   Diagnosis Date Noted  . CVA (cerebral vascular accident) ([place]) 09/15/2016  . CVA (cerebral infarction) 02/12/2015  . Peripheral vascular disease, unspecified ([place]) 09/29/2013    Andrey Spearman, MS, OTR/L, [facility] 02/20/17 12:35 PM  [place] MAIN [facility] SERVICES [address] [place], [place], [address] Phone: [phone]   Fax:  [phone]  Name: Adam Merritt MRN: [id number] Date of Birth: Dec 22, 1945

## 2017-02-22 ENCOUNTER — Ambulatory Visit: Payer: Medicare Other | Admitting: Occupational Therapy

## 2017-02-22 ENCOUNTER — Encounter: Payer: Medicare Other | Admitting: Occupational Therapy

## 2017-02-22 DIAGNOSIS — I890 Lymphedema, not elsewhere classified: Secondary | ICD-10-CM | POA: Diagnosis not present

## 2017-02-22 NOTE — Therapy (Signed)
[place] MAIN [facility] SERVICES [address] [place], [place], [address] Phone: [phone]   Fax:  [phone]  Occupational Therapy Treatment  Patient Details  Name: Adam Merritt MRN: [id number] Date of Birth: November 03, 1945 No Data Recorded  Encounter Date: 02/22/2017      OT End of Session - 02/22/17 1352    Visit Number 4   Number of Visits 36   Date for OT Re-Evaluation 03/12/17   OT Start Time 1100   OT Stop Time 1200   OT Time Calculation (min) 60 min   Activity Tolerance Patient tolerated treatment well;No increased pain   Behavior During Therapy WFL for tasks assessed/performed      Past Medical History:  Diagnosis Date  . Anemia   . CAD (coronary artery disease)   . Chest pain   . Coronary artery disease   . Diabetes mellitus without complication ([place])   . Esophageal reflux   . Herpes zoster without mention of complication   . Hyperlipidemia   . Hypertension   . Insomnia   . Lumbago   . Neuropathy in diabetes ([place])   . Obstructive chronic bronchitis without exacerbation ([place])   . Other malaise and fatigue   . Stroke ([place])   . Varicose veins     Past Surgical History:  Procedure Laterality Date  . CHOLECYSTECTOMY    . CORONARY ARTERY BYPASS GRAFT      There were no vitals filed for this visit.      Subjective Assessment - 02/22/17 1350    Subjective  Pt returns for OT  visit 4/ 36 for CDT to BLE, Pt is accompanied by his spouse. Pt has no new complaints.    Patient is accompained by: Family member   Pertinent History DM, OSA, CAROTID artery disease, diabetic neuropathy, Hx TIA, recent Dopler negative for DVT, hx recurrent cellulitis, Obesity, CVI   Limitations difficulty walking, impaired functional mobility, difficulty bathing feet and legs, difficulty fitting lower body clothing and street shoes;  difficulty donning preferred shoes and socks;    Repetition Increases Symptoms   Patient Stated Goals Get my legs  better so I dont have to worry about losing them all the time.    Currently in Pain? No/denies   Pain Onset Other (comment)                      OT Treatments/Exercises (OP) - 02/22/17 0001      ADLs   ADL Education Given Yes     Manual Therapy   Manual Therapy Edema management;Compression Bandaging   Compression Bandaging Ankle to below Knee (A-D)  LLE gradient compression wraps applied from foot to below knee as follows: Toe wrap omitted as not indicated. . Knee length cotton stockinett from base of toes to knee. One roll Rasidol soft foam. cumferentially from foot to tibila tuberosity w/ ~ 50% overlap; single 8 cm x 5 m  to foot and ankle, one 12 cm  x 5 m ankle to achilles origin...all applied circumferentially in custommary layered gradient configuration. Fitted w/ 3 x non-slip sock as Pt able to fit shoe over wraps.                OT Education - 02/22/17 1351    Education provided Yes   Education Details Emphasis of LE self care training in Pt and CG training for propper aplication of knee lengtj gradient compression wraps   Person(s) Educated Patient;Spouse  Methods Explanation;Demonstration;Tactile cues;Verbal cues;Handout   Comprehension Verbalized understanding;Returned demonstration;Verbal cues required;Tactile cues required;Need further instruction             OT Long Term Goals - 12/12/16 1438      OT LONG TERM GOAL #1   Title Pt independent w/ lymphedema precautions and prevention principals and strategies to limit LE progression and infection risk using printed handout for reference to limit lymphedema progression and infection risk.   Baseline dependent   Time 1   Period Weeks   Status New     OT LONG TERM GOAL #2   Title Lymphedema (LE) management/ self-care: Pt able to apply knee length, multi layered,  gradient compression wraps with maximum caregiver assistance using proper techniques within 2 weeks to achieve optimal limb volume  reductions bilaterally.   Baseline dependent   Time 2   Period Weeks   Status New     OT LONG TERM GOAL #3   Title Lymphedema (LE) management/ self-care:  Pt to achieve at least 10% limb volume reduction  bilaterally below the knees during Intensive Phase CDT to limit progression, to reduce leg pain, and to improve ADLs performance, and to facilitate safe functional mobility and ambulation.   Baseline dependent   Time 12   Period Weeks   Status New     OT LONG TERM GOAL #4   Title Lymphedema (LE) management/ self-care:  Pt to tolerate daily compression wraps, compression garments and/ or HOS devices in keeping w/ prescribed wear regime within 1 week of issue date of each to progress and retain clinical and functional gains and to limit LE progression.   Baseline dependent   Time 12   Period Weeks     OT LONG TERM GOAL #5   Title Lymphedema (LE) management/ self-care:  During Management Phase CDT Pt to sustain current limb volumes within 5%, and all other clinical gains achieved during OT treatment with needed level of caregiver assistance to limit LE progression, infection risk and further functional decline.   Baseline dependent   Time 6   Period Months   Status New     Long Term Additional Goals   Additional Long Term Goals Yes     OT LONG TERM GOAL #6   Title Pt to remain wound and nfection free in BLE for 6 months to limit lymphedema progression , to limit non-heealing leg wounds , and to limit increased difficulty w/ functional ambulation and mobility needed for optimal ADLs performance   Baseline dependent   Time 6   Period Weeks   Status New               Plan - 02/22/17 1353    Clinical Impression Statement After skilled teaching spouse able to apply knee length compression wraps using propper gradient techniques with moderate assistance. Cont as pre POC. Cont to focus on wrap education until mastered before commencing CDT.   Occupational performance deficits  (Please refer to evaluation for details): ADL's;IADL's;Rest and Sleep;Work;Leisure;Social Participation;Other  functional ambulation and transfers   Rehab Potential Good   Current Impairments/barriers affecting progress: difficulty reaching distal legs and feet for skin infection, dressing, bathing, nail care and skin care. ZUnable to apply compression wraps himself. Will require max caregiver assist for optimal outcome. Deep tissue induration may limit clinical response to CDT. Hx of wounds and  recurrent infection  Falls risk   OT Frequency 3x / week   OT Duration 12 weeks   OT Treatment/Interventions  Self-care/ADL training;Therapeutic exercise;Patient/family education;Manual Therapy;Manual lymph drainage;Therapeutic exercises;DME and/or AE instruction;Compression bandaging;Other (comment)  skin care with low PH lotion   or castor oil during MLD   Consulted and Agree with Plan of Care Patient;Family member/caregiver      Patient will benefit from skilled therapeutic intervention in order to improve the following deficits and impairments:  Decreased endurance, Decreased skin integrity, Decreased knowledge of precautions, Decreased scar mobility, Impaired perceived functional ability, Improper body mechanics, Decreased activity tolerance, Decreased knowledge of use of DME, Impaired flexibility, Decreased balance, Decreased mobility, Difficulty walking, Impaired sensation, Obesity, Decreased range of motion, Increased edema, Pain, Other (comment) (increased fall risk; increased infection risk; increased risk non-healing wounds)  Visit Diagnosis: Lymphedema, not elsewhere classified    Problem List Patient Active Problem List   Diagnosis Date Noted  . CVA (cerebral vascular accident) ([place]) 09/15/2016  . CVA (cerebral infarction) 02/12/2015  . Peripheral vascular disease, unspecified ([place]) 09/29/2013    Andrey Spearman, MS, OTR/L, [facility] 02/22/17 1:55 PM  [place] MAIN [facility] SERVICES [address] [place], [place], [address] Phone: [phone]   Fax:  [phone]  Name: Adam Merritt MRN: [id number] Date of Birth: [birth year]

## 2017-02-26 ENCOUNTER — Encounter: Payer: Medicare Other | Admitting: Occupational Therapy

## 2017-02-26 ENCOUNTER — Ambulatory Visit: Payer: Medicare Other | Admitting: Occupational Therapy

## 2017-02-26 DIAGNOSIS — I890 Lymphedema, not elsewhere classified: Secondary | ICD-10-CM | POA: Diagnosis not present

## 2017-02-26 NOTE — Therapy (Signed)
[place] MAIN [facility] SERVICES [address] [place], [place], [address] Phone: [phone]   Fax:  [phone]  Occupational Therapy Treatment  Patient Details  Name: Adam Merritt MRN: [id number] Date of Birth: 1946/02/02 No Data Recorded  Encounter Date: 02/26/2017      OT End of Session - 02/26/17 1648    Visit Number 5   Number of Visits 36   Date for OT Re-Evaluation 03/12/17   OT Start Time 1110   OT Stop Time 1210   OT Time Calculation (min) 60 min   Activity Tolerance Patient tolerated treatment well;No increased pain   Behavior During Therapy WFL for tasks assessed/performed      Past Medical History:  Diagnosis Date  . Anemia   . CAD (coronary artery disease)   . Chest pain   . Coronary artery disease   . Diabetes mellitus without complication ([place])   . Esophageal reflux   . Herpes zoster without mention of complication   . Hyperlipidemia   . Hypertension   . Insomnia   . Lumbago   . Neuropathy in diabetes ([place])   . Obstructive chronic bronchitis without exacerbation ([place])   . Other malaise and fatigue   . Stroke ([place])   . Varicose veins     Past Surgical History:  Procedure Laterality Date  . CHOLECYSTECTOMY    . CORONARY ARTERY BYPASS GRAFT      There were no vitals filed for this visit.      Subjective Assessment - 02/26/17 1644    Subjective  Pt returns for OT  visit 5/ 36 for CDT to BLE, Pt is accompanied by his spouse. Spouse complains that Pt would't allow her to apply wraps over the weekend.   Patient is accompained by: Family member   Pertinent History DM, OSA, CAROTID artery disease, diabetic neuropathy, Hx TIA, recent Dopler negative for DVT, hx recurrent cellulitis, Obesity, CVI   Limitations difficulty walking, impaired functional mobility, difficulty bathing feet and legs, difficulty fitting lower body clothing and street shoes;  difficulty donning preferred shoes and socks;    Repetition  Increases Symptoms   Patient Stated Goals Get my legs better so I dont have to worry about losing them all the time.    Currently in Pain? Yes  hurts all over. I was sickl all weekend.   Pain Score 8    Pain Location Leg   Pain Orientation Right;Left   Pain Descriptors / Indicators Aching;Sore;Numbness;Pins and needles;Tightness;Tingling;Tiring;Other (Comment);Heaviness;Burning  fullness, stiffness   Pain Type Chronic pain;Neuropathic pain   Pain Onset Other (comment)                              OT Education - 02/26/17 1647    Education provided Yes   Education Details Continues skilled Pt and CG edu for multilayer compression wraps below the knee.    Person(s) Educated Patient;Spouse   Methods Explanation;Demonstration;Tactile cues;Verbal cues;Handout   Comprehension Verbalized understanding;Returned demonstration;Verbal cues required;Tactile cues required;Need further instruction             OT Long Term Goals - 12/12/16 1438      OT LONG TERM GOAL #1   Title Pt independent w/ lymphedema precautions and prevention principals and strategies to limit LE progression and infection risk using printed handout for reference to limit lymphedema progression and infection risk.   Baseline dependent   Time 1   Period  Weeks   Status New     OT LONG TERM GOAL #2   Title Lymphedema (LE) management/ self-care: Pt able to apply knee length, multi layered,  gradient compression wraps with maximum caregiver assistance using proper techniques within 2 weeks to achieve optimal limb volume reductions bilaterally.   Baseline dependent   Time 2   Period Weeks   Status New     OT LONG TERM GOAL #3   Title Lymphedema (LE) management/ self-care:  Pt to achieve at least 10% limb volume reduction  bilaterally below the knees during Intensive Phase CDT to limit progression, to reduce leg pain, and to improve ADLs performance, and to facilitate safe functional mobility and  ambulation.   Baseline dependent   Time 12   Period Weeks   Status New     OT LONG TERM GOAL #4   Title Lymphedema (LE) management/ self-care:  Pt to tolerate daily compression wraps, compression garments and/ or HOS devices in keeping w/ prescribed wear regime within 1 week of issue date of each to progress and retain clinical and functional gains and to limit LE progression.   Baseline dependent   Time 12   Period Weeks     OT LONG TERM GOAL #5   Title Lymphedema (LE) management/ self-care:  During Management Phase CDT Pt to sustain current limb volumes within 5%, and all other clinical gains achieved during OT treatment with needed level of caregiver assistance to limit LE progression, infection risk and further functional decline.   Baseline dependent   Time 6   Period Months   Status New     Long Term Additional Goals   Additional Long Term Goals Yes     OT LONG TERM GOAL #6   Title Pt to remain wound and nfection free in BLE for 6 months to limit lymphedema progression , to limit non-heealing leg wounds , and to limit increased difficulty w/ functional ambulation and mobility needed for optimal ADLs performance   Baseline dependent   Time 6   Period Weeks   Status New               Plan - 02/26/17 1652    Clinical Impression Statement Full hour devoted to Pt and spouse edu for application of multilayer , gradient compression wraps below the knee. Pt is beginning to give verrbal cues to spuse to correct techniques. Spouse is having really difficult time learning these sinple techniques,. OT was frank with Pt   and spouse that without  consistent 23/7 compression during  Intensive CDT in his case, his prognosis for controlling leg swelling and limiting progression is poor. Will teach wrap application again next  visit and all week PRN.   Occupational performance deficits (Please refer to evaluation for details): ADL's;Leisure;Social Participation;IADL's;Rest and Sleep;Work    Rehab Potential Good   Current Impairments/barriers affecting progress: difficulty reaching distal legs and feet for skin infection, dressing, bathing, nail care and skin care. ZUnable to apply compression wraps himself. Will require max caregiver assist for optimal outcome. Deep tissue induration may limit clinical response to CDT. Hx of wounds and  recurrent infection  Falls risk   OT Frequency 3x / week   OT Duration 12 weeks   OT Treatment/Interventions Self-care/ADL training;Therapeutic exercise;Patient/family education;Manual Therapy;Manual lymph drainage;Therapeutic exercises;DME and/or AE instruction;Compression bandaging;Other (comment)  skin care with low PH lotion   or castor oil during MLD   Consulted and Agree with Plan of Care Patient;Family member/caregiver  Patient will benefit from skilled therapeutic intervention in order to improve the following deficits and impairments:  Decreased endurance, Decreased skin integrity, Decreased knowledge of precautions, Decreased scar mobility, Impaired perceived functional ability, Improper body mechanics, Decreased activity tolerance, Decreased knowledge of use of DME, Impaired flexibility, Decreased balance, Decreased mobility, Difficulty walking, Impaired sensation, Obesity, Decreased range of motion, Increased edema, Pain, Other (comment) (increased fall risk; increased infection risk; increased risk non-healing wounds)  Visit Diagnosis: Lymphedema, not elsewhere classified    Problem List Patient Active Problem List   Diagnosis Date Noted  . CVA (cerebral vascular accident) ([place]) 09/15/2016  . CVA (cerebral infarction) 02/12/2015  . Peripheral vascular disease, unspecified ([place]) 09/29/2013    Andrey Spearman, MS, OTR/L, [facility] 02/26/17 4:59 PM  [place] MAIN [facility] SERVICES [address] [place], [place], [address] Phone: [phone]   Fax:  [phone]  Name: WEBB WEED MRN: [id number] Date of Birth: 1945/09/20

## 2017-02-27 ENCOUNTER — Encounter: Payer: Medicare Other | Admitting: Occupational Therapy

## 2017-02-27 ENCOUNTER — Ambulatory Visit: Payer: Medicare Other | Admitting: Occupational Therapy

## 2017-03-01 ENCOUNTER — Encounter: Payer: Medicare Other | Admitting: Occupational Therapy

## 2017-03-01 ENCOUNTER — Ambulatory Visit: Payer: Medicare Other | Admitting: Occupational Therapy

## 2017-03-05 ENCOUNTER — Ambulatory Visit: Payer: Medicare Other | Attending: Surgery | Admitting: Occupational Therapy

## 2017-03-05 ENCOUNTER — Ambulatory Visit: Payer: Medicare Other | Admitting: Occupational Therapy

## 2017-03-05 ENCOUNTER — Encounter: Payer: Medicare Other | Admitting: Occupational Therapy

## 2017-03-05 DIAGNOSIS — I890 Lymphedema, not elsewhere classified: Secondary | ICD-10-CM | POA: Insufficient documentation

## 2017-03-05 NOTE — Therapy (Signed)
[place] MAIN [facility] SERVICES [address] [place], [place], [address] Phone: [phone]   Fax:  [phone]  Occupational Therapy Treatment  Patient Details  Name: Adam Merritt MRN: [id number] Date of Birth: 10/23/1945 No Data Recorded  Encounter Date: 03/05/2017      OT End of Session - 03/05/17 1537    Visit Number 6   Number of Visits 36   Date for OT Re-Evaluation 03/12/17   OT Start Time 1105   OT Stop Time 1200   OT Time Calculation (min) 55 min   Activity Tolerance Patient tolerated treatment well;No increased pain   Behavior During Therapy WFL for tasks assessed/performed      Past Medical History:  Diagnosis Date  . Anemia   . CAD (coronary artery disease)   . Chest pain   . Coronary artery disease   . Diabetes mellitus without complication ([place])   . Esophageal reflux   . Herpes zoster without mention of complication   . Hyperlipidemia   . Hypertension   . Insomnia   . Lumbago   . Neuropathy in diabetes ([place])   . Obstructive chronic bronchitis without exacerbation ([place])   . Other malaise and fatigue   . Stroke ([place])   . Varicose veins     Past Surgical History:  Procedure Laterality Date  . CHOLECYSTECTOMY    . CORONARY ARTERY BYPASS GRAFT      There were no vitals filed for this visit.      Subjective Assessment - 03/05/17 1534    Subjective  Pt returns for OT  visit 5/ 36 for CDT to BLE, Pt is accompanied by his spouse. Again, Pt would not allow spouse to apply wraps over the weekend. Pt confirms that he has not utilized compression on the LLE since last seen last week.   Patient is accompained by: Family member   Pertinent History DM, OSA, CAROTID artery disease, diabetic neuropathy, Hx TIA, recent Dopler negative for DVT, hx recurrent cellulitis, Obesity, CVI   Limitations difficulty walking, impaired functional mobility, difficulty bathing feet and legs, difficulty fitting lower body clothing and  street shoes;  difficulty donning preferred shoes and socks;    Repetition Increases Symptoms   Patient Stated Goals Get my legs better so I dont have to worry about losing them all the time.    Currently in Pain? Yes  unchanged from initial evaluation.   Pain Onset Other (comment)                      OT Treatments/Exercises (OP) - 03/05/17 0001      ADLs   ADL Education Given Yes     Manual Therapy   Manual Therapy Edema management;Compression Bandaging   Compression Bandaging Ankle to below Knee (A-D)  LLE gradient compression wraps applied from foot to below knee as follows: Toe wrap omitted as not indicated. . Knee length cotton stockinett from base of toes to knee. One roll Rasidol soft foam. cumferentially from foot to tibila tuberosity w/ ~ 50% overlap; single 8 cm x 5 m  to foot and ankle, one 12 cm  x 5 m ankle to achilles origin...all applied circumferentially in custommary layered gradient configuration. Fitted w/ 3 x non-slip sock as Pt able to fit shoe over wraps.                OT Education - 03/05/17 1536    Education provided Yes   Education  Details Emphasis of session on Pt and cvaregiver training for gradient compression wrapping.   Person(s) Educated Patient;Spouse   Methods Explanation;Demonstration;Tactile cues;Verbal cues;Handout   Comprehension Verbalized understanding;Returned demonstration;Verbal cues required;Tactile cues required;Need further instruction             OT Long Term Goals - 12/12/16 1438      OT LONG TERM GOAL #1   Title Pt independent w/ lymphedema precautions and prevention principals and strategies to limit LE progression and infection risk using printed handout for reference to limit lymphedema progression and infection risk.   Baseline dependent   Time 1   Period Weeks   Status New     OT LONG TERM GOAL #2   Title Lymphedema (LE) management/ self-care: Pt able to apply knee length, multi layered,  gradient  compression wraps with maximum caregiver assistance using proper techniques within 2 weeks to achieve optimal limb volume reductions bilaterally.   Baseline dependent   Time 2   Period Weeks   Status New     OT LONG TERM GOAL #3   Title Lymphedema (LE) management/ self-care:  Pt to achieve at least 10% limb volume reduction  bilaterally below the knees during Intensive Phase CDT to limit progression, to reduce leg pain, and to improve ADLs performance, and to facilitate safe functional mobility and ambulation.   Baseline dependent   Time 12   Period Weeks   Status New     OT LONG TERM GOAL #4   Title Lymphedema (LE) management/ self-care:  Pt to tolerate daily compression wraps, compression garments and/ or HOS devices in keeping w/ prescribed wear regime within 1 week of issue date of each to progress and retain clinical and functional gains and to limit LE progression.   Baseline dependent   Time 12   Period Weeks     OT LONG TERM GOAL #5   Title Lymphedema (LE) management/ self-care:  During Management Phase CDT Pt to sustain current limb volumes within 5%, and all other clinical gains achieved during OT treatment with needed level of caregiver assistance to limit LE progression, infection risk and further functional decline.   Baseline dependent   Time 6   Period Months   Status New     Long Term Additional Goals   Additional Long Term Goals Yes     OT LONG TERM GOAL #6   Title Pt to remain wound and nfection free in BLE for 6 months to limit lymphedema progression , to limit non-heealing leg wounds , and to limit increased difficulty w/ functional ambulation and mobility needed for optimal ADLs performance   Baseline dependent   Time 6   Period Weeks   Status New               Plan - 03/05/17 1537    Clinical Impression Statement Again,  emphasis of full session on Pt and spouse training for self compression wrapping using gradient techniques. By end of session ,  spouse abble to comple knee length wrap using correnct techniques      with mod A. OT had frank discussion with Pt and spouse regarding compliance with compression. They verbalized understanding that without compliance with all OT LE management protociols, prognosis is poor at best. Pt has been 100% uncomplianrt to date. He and spouse understand that if they show no progress with compression compliance by end of this week they will be DC from OT and can resume when they are able to fully  participate.    Occupational performance deficits (Please refer to evaluation for details): ADL's;IADL's;Work;Leisure;Social Participation   Rehab Potential Good   Current Impairments/barriers affecting progress: difficulty reaching distal legs and feet for skin infection, dressing, bathing, nail care and skin care. ZUnable to apply compression wraps himself. Will require max caregiver assist for optimal outcome. Deep tissue induration may limit clinical response to CDT. Hx of wounds and  recurrent infection  Falls risk   OT Frequency 3x / week   OT Duration 12 weeks   OT Treatment/Interventions Self-care/ADL training;Therapeutic exercise;Patient/family education;Manual Therapy;Manual lymph drainage;Therapeutic exercises;DME and/or AE instruction;Compression bandaging;Other (comment)  skin care with low PH lotion   or castor oil during MLD   Consulted and Agree with Plan of Care Patient;Family member/caregiver      Patient will benefit from skilled therapeutic intervention in order to improve the following deficits and impairments:  Decreased endurance, Decreased skin integrity, Decreased knowledge of precautions, Decreased scar mobility, Impaired perceived functional ability, Improper body mechanics, Decreased activity tolerance, Decreased knowledge of use of DME, Impaired flexibility, Decreased balance, Decreased mobility, Difficulty walking, Impaired sensation, Obesity, Decreased range of motion, Increased edema,  Pain, Other (comment) (increased fall risk; increased infection risk; increased risk non-healing wounds)  Visit Diagnosis: Lymphedema, not elsewhere classified    Problem List Patient Active Problem List   Diagnosis Date Noted  . CVA (cerebral vascular accident) ([place]) 09/15/2016  . CVA (cerebral infarction) 02/12/2015  . Peripheral vascular disease, unspecified ([place]) 09/29/2013    Andrey Spearman, MS, OTR/L, [facility] 03/05/17 3:41 PM  [place] MAIN [facility] SERVICES [address] [place], [place], [address] Phone: [phone]   Fax:  [phone]  Name: Adam Merritt MRN: [id number] Date of Birth: February 05, 1946

## 2017-03-06 ENCOUNTER — Ambulatory Visit: Payer: Medicare Other | Admitting: Occupational Therapy

## 2017-03-06 ENCOUNTER — Encounter: Payer: Medicare Other | Admitting: Occupational Therapy

## 2017-03-08 ENCOUNTER — Ambulatory Visit: Payer: Medicare Other | Admitting: Occupational Therapy

## 2017-03-08 ENCOUNTER — Encounter: Payer: Medicare Other | Admitting: Occupational Therapy

## 2017-03-08 DIAGNOSIS — I890 Lymphedema, not elsewhere classified: Secondary | ICD-10-CM | POA: Diagnosis not present

## 2017-03-08 NOTE — Therapy (Signed)
[place] MAIN [facility] SERVICES [address] [place], [place], [address] Phone: [phone]   Fax:  [phone]  Occupational Therapy Treatment  Patient Details  Name: Adam Merritt MRN: [id number] Date of Birth: Mar 13, 1946 No Data Recorded  Encounter Date: 03/08/2017      OT End of Session - 03/08/17 1332    Visit Number 7   Number of Visits 36   Date for OT Re-Evaluation 03/12/17   OT Start Time 1100   OT Stop Time 1200   OT Time Calculation (min) 60 min   Activity Tolerance Patient tolerated treatment well;No increased pain   Behavior During Therapy WFL for tasks assessed/performed      Past Medical History:  Diagnosis Date  . Anemia   . CAD (coronary artery disease)   . Chest pain   . Coronary artery disease   . Diabetes mellitus without complication ([place])   . Esophageal reflux   . Herpes zoster without mention of complication   . Hyperlipidemia   . Hypertension   . Insomnia   . Lumbago   . Neuropathy in diabetes ([place])   . Obstructive chronic bronchitis without exacerbation ([place])   . Other malaise and fatigue   . Stroke ([place])   . Varicose veins     Past Surgical History:  Procedure Laterality Date  . CHOLECYSTECTOMY    . CORONARY ARTERY BYPASS GRAFT      There were no vitals filed for this visit.      Subjective Assessment - 03/08/17 1330    Subjective  Pt returns for OT  visit 7/ 36 for CDT to BLE, Pt is accompanied by his spouse. APt arrives with compression wraps in place as applied by spouse during visit interval x 2.   Patient is accompained by: Family member   Pertinent History DM, OSA, CAROTID artery disease, diabetic neuropathy, Hx TIA, recent Dopler negative for DVT, hx recurrent cellulitis, Obesity, CVI   Limitations difficulty walking, impaired functional mobility, difficulty bathing feet and legs, difficulty fitting lower body clothing and street shoes;  difficulty donning preferred shoes and socks;     Repetition Increases Symptoms   Patient Stated Goals Get my legs better so I dont have to worry about losing them all the time.    Currently in Pain? No/denies   Pain Onset Other (comment)                              OT Education - 03/08/17 1331    Education provided Yes   Education Details LE sel;f care training for simple self MLD today. Caregiver practices J stroke with good return.   Person(s) Educated Patient;Spouse   Methods Explanation;Demonstration;Verbal cues;Tactile cues;Handout   Comprehension Verbalized understanding;Returned demonstration;Verbal cues required;Tactile cues required             OT Long Term Goals - 12/12/16 1438      OT LONG TERM GOAL #1   Title Pt independent w/ lymphedema precautions and prevention principals and strategies to limit LE progression and infection risk using printed handout for reference to limit lymphedema progression and infection risk.   Baseline dependent   Time 1   Period Weeks   Status New     OT LONG TERM GOAL #2   Title Lymphedema (LE) management/ self-care: Pt able to apply knee length, multi layered,  gradient compression wraps with maximum caregiver assistance using proper techniques within 2  weeks to achieve optimal limb volume reductions bilaterally.   Baseline dependent   Time 2   Period Weeks   Status New     OT LONG TERM GOAL #3   Title Lymphedema (LE) management/ self-care:  Pt to achieve at least 10% limb volume reduction  bilaterally below the knees during Intensive Phase CDT to limit progression, to reduce leg pain, and to improve ADLs performance, and to facilitate safe functional mobility and ambulation.   Baseline dependent   Time 12   Period Weeks   Status New     OT LONG TERM GOAL #4   Title Lymphedema (LE) management/ self-care:  Pt to tolerate daily compression wraps, compression garments and/ or HOS devices in keeping w/ prescribed wear regime within 1 week of issue date of each  to progress and retain clinical and functional gains and to limit LE progression.   Baseline dependent   Time 12   Period Weeks     OT LONG TERM GOAL #5   Title Lymphedema (LE) management/ self-care:  During Management Phase CDT Pt to sustain current limb volumes within 5%, and all other clinical gains achieved during OT treatment with needed level of caregiver assistance to limit LE progression, infection risk and further functional decline.   Baseline dependent   Time 6   Period Months   Status New     Long Term Additional Goals   Additional Long Term Goals Yes     OT LONG TERM GOAL #6   Title Pt to remain wound and nfection free in BLE for 6 months to limit lymphedema progression , to limit non-heealing leg wounds , and to limit increased difficulty w/ functional ambulation and mobility needed for optimal ADLs performance   Baseline dependent   Time 6   Period Weeks   Status New               Plan - 03/08/17 1333    Clinical Impression Statement Spouse reports 100 compliance with daily skin care, bathing and BLE compression wrapping below the knee during visit interval. LLE is palpably less dense and visually mildly reduced in limb volume today. Skin is less tight and easier to mobilize for MLD. Pt tolerated MLD to LLE/LLQ without difficulty today. Pt and spouse encouraged to continue as to [place] for all home program componendts for optimal outcome.   Rehab Potential Good   Current Impairments/barriers affecting progress: difficulty reaching distal legs and feet for skin infection, dressing, bathing, nail care and skin care. ZUnable to apply compression wraps himself. Will require max caregiver assist for optimal outcome. Deep tissue induration may limit clinical response to CDT. Hx of wounds and  recurrent infection  Falls risk   OT Frequency 3x / week   OT Duration 12 weeks   OT Treatment/Interventions Self-care/ADL training;Therapeutic exercise;Patient/family education;Manual  Therapy;Manual lymph drainage;Therapeutic exercises;DME and/or AE instruction;Compression bandaging;Other (comment)  skin care with low PH lotion   or castor oil during MLD   Consulted and Agree with Plan of Care Patient;Family member/caregiver      Patient will benefit from skilled therapeutic intervention in order to improve the following deficits and impairments:  Decreased endurance, Decreased skin integrity, Decreased knowledge of precautions, Decreased scar mobility, Impaired perceived functional ability, Improper body mechanics, Decreased activity tolerance, Decreased knowledge of use of DME, Impaired flexibility, Decreased balance, Decreased mobility, Difficulty walking, Impaired sensation, Obesity, Decreased range of motion, Increased edema, Pain, Other (comment) (increased fall risk; increased infection risk; increased risk  non-healing wounds)  Visit Diagnosis: Lymphedema, not elsewhere classified    Problem List Patient Active Problem List   Diagnosis Date Noted  . CVA (cerebral vascular accident) ([place]) 09/15/2016  . CVA (cerebral infarction) 02/12/2015  . Peripheral vascular disease, unspecified ([place]) 09/29/2013    Andrey Spearman, MS, OTR/L, [facility] 03/08/17 1:36 PM  [place] MAIN [facility] SERVICES [address] [place], [place], [address] Phone: [phone]   Fax:  [phone]  Name: MARIE CHOW MRN: [id number] Date of Birth: 1945/09/17

## 2017-03-12 ENCOUNTER — Ambulatory Visit: Payer: Medicare Other | Admitting: Occupational Therapy

## 2017-03-12 ENCOUNTER — Encounter: Payer: Medicare Other | Admitting: Occupational Therapy

## 2017-03-12 DIAGNOSIS — I890 Lymphedema, not elsewhere classified: Secondary | ICD-10-CM | POA: Diagnosis not present

## 2017-03-12 NOTE — Therapy (Signed)
[place] MAIN [facility] SERVICES [address] [place], [place], [address] Phone: [phone]   Fax:  [phone]  Occupational Therapy Treatment  Patient Details  Name: Adam Merritt MRN: [id number] Date of Birth: July 13, 1945 No Data Recorded  Encounter Date: 03/12/2017      OT End of Session - 03/12/17 1336    Visit Number 8   Number of Visits 36   Date for OT Re-Evaluation 03/12/17   OT Start Time 1105   OT Stop Time 1202   OT Time Calculation (min) 57 min   Activity Tolerance Patient tolerated treatment well;No increased pain   Behavior During Therapy WFL for tasks assessed/performed      Past Medical History:  Diagnosis Date  . Anemia   . CAD (coronary artery disease)   . Chest pain   . Coronary artery disease   . Diabetes mellitus without complication ([place])   . Esophageal reflux   . Herpes zoster without mention of complication   . Hyperlipidemia   . Hypertension   . Insomnia   . Lumbago   . Neuropathy in diabetes ([place])   . Obstructive chronic bronchitis without exacerbation ([place])   . Other malaise and fatigue   . Stroke ([place])   . Varicose veins     Past Surgical History:  Procedure Laterality Date  . CHOLECYSTECTOMY    . CORONARY ARTERY BYPASS GRAFT      There were no vitals filed for this visit.      Subjective Assessment - 03/12/17 1117    Subjective  Pt returns for OT  visit 8/ 36 for CDT to BLE, Pt is accompanied by his spouse. APt arrives with compression wraps in place. Pt and spouse have no new complaints.   Patient is accompained by: Family member   Pertinent History DM, OSA, CAROTID artery disease, diabetic neuropathy, Hx TIA, recent Dopler negative for DVT, hx recurrent cellulitis, Obesity, CVI   Limitations difficulty walking, impaired functional mobility, difficulty bathing feet and legs, difficulty fitting lower body clothing and street shoes;  difficulty donning preferred shoes and socks;    Repetition Increases Symptoms   Patient Stated Goals Get my legs better so I dont have to worry about losing them all the time.    Currently in Pain? No/denies   Pain Onset Other (comment)                      OT Treatments/Exercises (OP) - 03/12/17 0001      ADLs   ADL Education Given Yes     Manual Therapy   Manual Therapy Compression Bandaging;Manual Lymphatic Drainage (MLD)   Manual therapy comments skin care to LLE   Manual Lymphatic Drainage (MLD) MLD to LLE/LLQ in supine utilizing short neck sequence and in tact deep abdominal and ipsilateral inguinal LN. Pt encouraged to perform diaphramatic breathing throughout.   Compression Bandaging Ankle to below Knee (A-D)  LLE gradient compression wraps applied  as follows: Toe wrap omitted. . Knee length cotton stockinett from base of toes to knee. One roll Rasidol soft foam. cumferentially from foot to tibila tuberosity w/ ~ 50% overlap; single 8 cm x 5 m  to foot and ankle, one 12 cm  x 5 m ankle to achilles origin...all applied circumferentially in custommary layered gradient configuration. Fitted w/ 3 x non-slip sock as Pt able to fit shoe over wraps.  OT Education - 03/12/17 1120    Education provided (P)  Yes   Education Details (P)  Continued skilled Pt/caregiver education  And LE ADL training throughout visit for lymphedema self care/ home program, including compression wrapping, compression garment and device wear/care, lymphatic pumping ther ex, simple self-MLD, and skin care. Discussed progress towards goals   Person(s) Educated (P)  Patient;Spouse   Methods (P)  Explanation;Demonstration;Tactile cues;Verbal cues   Comprehension (P)  Verbalized understanding;Returned demonstration;Verbal cues required;Tactile cues required             OT Long Term Goals - 12/12/16 1438      OT LONG TERM GOAL #1   Title Pt independent w/ lymphedema precautions and prevention principals and strategies  to limit LE progression and infection risk using printed handout for reference to limit lymphedema progression and infection risk.   Baseline dependent   Time 1   Period Weeks   Status New     OT LONG TERM GOAL #2   Title Lymphedema (LE) management/ self-care: Pt able to apply knee length, multi layered,  gradient compression wraps with maximum caregiver assistance using proper techniques within 2 weeks to achieve optimal limb volume reductions bilaterally.   Baseline dependent   Time 2   Period Weeks   Status New     OT LONG TERM GOAL #3   Title Lymphedema (LE) management/ self-care:  Pt to achieve at least 10% limb volume reduction  bilaterally below the knees during Intensive Phase CDT to limit progression, to reduce leg pain, and to improve ADLs performance, and to facilitate safe functional mobility and ambulation.   Baseline dependent   Time 12   Period Weeks   Status New     OT LONG TERM GOAL #4   Title Lymphedema (LE) management/ self-care:  Pt to tolerate daily compression wraps, compression garments and/ or HOS devices in keeping w/ prescribed wear regime within 1 week of issue date of each to progress and retain clinical and functional gains and to limit LE progression.   Baseline dependent   Time 12   Period Weeks     OT LONG TERM GOAL #5   Title Lymphedema (LE) management/ self-care:  During Management Phase CDT Pt to sustain current limb volumes within 5%, and all other clinical gains achieved during OT treatment with needed level of caregiver assistance to limit LE progression, infection risk and further functional decline.   Baseline dependent   Time 6   Period Months   Status New     Long Term Additional Goals   Additional Long Term Goals Yes     OT LONG TERM GOAL #6   Title Pt to remain wound and nfection free in BLE for 6 months to limit lymphedema progression , to limit non-heealing leg wounds , and to limit increased difficulty w/ functional ambulation and  mobility needed for optimal ADLs performance   Baseline dependent   Time 6   Period Weeks   Status New               Plan - 03/12/17 1337    Clinical Impression Statement LLE limb volume notably decreased again today after weekend visit interval. Spouse is applying compression garments daily between visits using proper techniques. Skin condition condition continues to improve daily. Pt slept through MLD today and CG edu for simple n self-MLD sequences. Cont as per POC.   Occupational performance deficits (Please refer to evaluation for details): ADL's;IADL's;Leisure;Social Participation   Rehab  Potential Good   Current Impairments/barriers affecting progress: difficulty reaching distal legs and feet for skin infection, dressing, bathing, nail care and skin care. ZUnable to apply compression wraps himself. Will require max caregiver assist for optimal outcome. Deep tissue induration may limit clinical response to CDT. Hx of wounds and  recurrent infection  Falls risk   OT Frequency 3x / week   OT Duration 12 weeks   OT Treatment/Interventions Self-care/ADL training;Therapeutic exercise;Patient/family education;Manual Therapy;Manual lymph drainage;Therapeutic exercises;DME and/or AE instruction;Compression bandaging;Other (comment)  skin care with low PH lotion   or castor oil during MLD   Consulted and Agree with Plan of Care Patient;Family member/caregiver      Patient will benefit from skilled therapeutic intervention in order to improve the following deficits and impairments:  Decreased endurance, Decreased skin integrity, Decreased knowledge of precautions, Decreased scar mobility, Impaired perceived functional ability, Improper body mechanics, Decreased activity tolerance, Decreased knowledge of use of DME, Impaired flexibility, Decreased balance, Decreased mobility, Difficulty walking, Impaired sensation, Obesity, Decreased range of motion, Increased edema, Pain, Other (comment)  (increased fall risk; increased infection risk; increased risk non-healing wounds)  Visit Diagnosis: Lymphedema, not elsewhere classified    Problem List Patient Active Problem List   Diagnosis Date Noted  . CVA (cerebral vascular accident) ([place]) 09/15/2016  . CVA (cerebral infarction) 02/12/2015  . Peripheral vascular disease, unspecified ([place]) 09/29/2013    Andrey Spearman, MS, OTR/L, [facility] 03/12/17 1:41 PM  [place] MAIN [facility] SERVICES [address] [place], [place], [address] Phone: [phone]   Fax:  [phone]  Name: Adam Merritt MRN: [id number] Date of Birth: [birth year]

## 2017-03-13 ENCOUNTER — Ambulatory Visit: Payer: Medicare Other | Admitting: Occupational Therapy

## 2017-03-13 ENCOUNTER — Encounter: Payer: Medicare Other | Admitting: Occupational Therapy

## 2017-03-15 ENCOUNTER — Encounter: Payer: Medicare Other | Admitting: Occupational Therapy

## 2017-03-15 ENCOUNTER — Ambulatory Visit: Payer: Medicare Other | Admitting: Occupational Therapy

## 2017-03-15 DIAGNOSIS — I890 Lymphedema, not elsewhere classified: Secondary | ICD-10-CM

## 2017-03-15 NOTE — Therapy (Signed)
[place] MAIN [facility] SERVICES [address] [place], [place], [address] Phone: [phone]   Fax:  [phone]  Occupational Therapy Treatment  Patient Details  Name: Adam Merritt MRN: [id number] Date of Birth: 01/29/1946 No Data Recorded  Encounter Date: 03/15/2017      OT End of Session - 03/15/17 1209    Visit Number 9   Number of Visits 36   Date for OT Re-Evaluation 03/12/17   OT Start Time 1105   OT Stop Time 1200   OT Time Calculation (min) 55 min   Activity Tolerance Patient tolerated treatment well;No increased pain   Behavior During Therapy WFL for tasks assessed/performed      Past Medical History:  Diagnosis Date  . Anemia   . CAD (coronary artery disease)   . Chest pain   . Coronary artery disease   . Diabetes mellitus without complication ([place])   . Esophageal reflux   . Herpes zoster without mention of complication   . Hyperlipidemia   . Hypertension   . Insomnia   . Lumbago   . Neuropathy in diabetes ([place])   . Obstructive chronic bronchitis without exacerbation ([place])   . Other malaise and fatigue   . Stroke ([place])   . Varicose veins     Past Surgical History:  Procedure Laterality Date  . CHOLECYSTECTOMY    . CORONARY ARTERY BYPASS GRAFT      There were no vitals filed for this visit.      Subjective Assessment - 03/15/17 1109    Subjective  Pt returns for OT  visit 9/ 36 for CDT to BLE, Pt is accompanied by his spouse. APt arrives with compression wraps in place. Pt and spouse have no new complaints.   Patient is accompained by: Family member   Pertinent History DM, OSA, CAROTID artery disease, diabetic neuropathy, Hx TIA, recent Dopler negative for DVT, hx recurrent cellulitis, Obesity, CVI   Limitations difficulty walking, impaired functional mobility, difficulty bathing feet and legs, difficulty fitting lower body clothing and street shoes;  difficulty donning preferred shoes and socks;    Repetition Increases Symptoms   Patient Stated Goals Get my legs better so I dont have to worry about losing them all the time.    Currently in Pain? No/denies   Pain Onset Other (comment)             LYMPHEDEMA/ONCOLOGY QUESTIONNAIRE - 03/15/17 1159      Left Lower Extremity Lymphedema   Other LLE A-D limb volume = 6357.71 ml   Other Pt has achieved 18.81 % LLE A-D volume reduction since  initial measurements taken 02/19/2017. This value meets and beats the 10% limb volume reduction goal.                  OT Treatments/Exercises (OP) - 03/15/17 0001      ADLs   ADL Education Given Yes     Manual Therapy   Manual Therapy Edema management;Manual Lymphatic Drainage (MLD);Compression Bandaging   Manual therapy comments skin care to BLE   Edema Management LLE comparative  volumetrics   Manual Lymphatic Drainage (MLD) MLD to LLE/LLQ in supine utilizing short neck sequence and in tact deep abdominal and ipsilateral inguinal LN. Pt encouraged to perform diaphramatic breathing throughout.   Compression Bandaging Ankle to below Knee (A-D)  LLE gradient compression wraps applied  as follows: Toe wrap omitted. . Knee length cotton stockinett from base of toes to knee. One roll  Rasidol soft foam. cumferentially from foot to tibila tuberosity w/ ~ 50% overlap; single 8 cm x 5 m  to foot and ankle, one 12 cm  x 5 m ankle to achilles origin...all applied circumferentially in custommary layered gradient configuration. Fitted w/ 3 x non-slip sock as Pt able to fit shoe over wraps.                      OT Long Term Goals - 12/12/16 1438      OT LONG TERM GOAL #1   Title Pt independent w/ lymphedema precautions and prevention principals and strategies to limit LE progression and infection risk using printed handout for reference to limit lymphedema progression and infection risk.   Baseline dependent   Time 1   Period Weeks   Status New     OT LONG TERM GOAL #2   Title  Lymphedema (LE) management/ self-care: Pt able to apply knee length, multi layered,  gradient compression wraps with maximum caregiver assistance using proper techniques within 2 weeks to achieve optimal limb volume reductions bilaterally.   Baseline dependent   Time 2   Period Weeks   Status New     OT LONG TERM GOAL #3   Title Lymphedema (LE) management/ self-care:  Pt to achieve at least 10% limb volume reduction  bilaterally below the knees during Intensive Phase CDT to limit progression, to reduce leg pain, and to improve ADLs performance, and to facilitate safe functional mobility and ambulation.   Baseline dependent   Time 12   Period Weeks   Status New     OT LONG TERM GOAL #4   Title Lymphedema (LE) management/ self-care:  Pt to tolerate daily compression wraps, compression garments and/ or HOS devices in keeping w/ prescribed wear regime within 1 week of issue date of each to progress and retain clinical and functional gains and to limit LE progression.   Baseline dependent   Time 12   Period Weeks     OT LONG TERM GOAL #5   Title Lymphedema (LE) management/ self-care:  During Management Phase CDT Pt to sustain current limb volumes within 5%, and all other clinical gains achieved during OT treatment with needed level of caregiver assistance to limit LE progression, infection risk and further functional decline.   Baseline dependent   Time 6   Period Months   Status New     Long Term Additional Goals   Additional Long Term Goals Yes     OT LONG TERM GOAL #6   Title Pt to remain wound and nfection free in BLE for 6 months to limit lymphedema progression , to limit non-heealing leg wounds , and to limit increased difficulty w/ functional ambulation and mobility needed for optimal ADLs performance   Baseline dependent   Time 6   Period Weeks   Status New               Plan - 03/15/17 1204    Clinical Impression Statement Pt has achieved 18.81 % LLE A-D volume  reduction since  initial measurements taken 02/19/2017. This value meets and beats the 10% limb volume reduction goal.  Pt tolerated manual protocols today. Spouse continues to practice skills for LE sef care in clinic and betweebn visits.   Occupational performance deficits (Please refer to evaluation for details): ADL's;IADL's;Rest and Sleep;Leisure;Social Participation   Rehab Potential Good   Current Impairments/barriers affecting progress: difficulty reaching distal legs and feet for skin infection,  dressing, bathing, nail care and skin care. ZUnable to apply compression wraps himself. Will require max caregiver assist for optimal outcome. Deep tissue induration may limit clinical response to CDT. Hx of wounds and  recurrent infection  Falls risk   OT Frequency 3x / week   OT Duration 12 weeks   OT Treatment/Interventions Self-care/ADL training;Therapeutic exercise;Patient/family education;Manual Therapy;Manual lymph drainage;Therapeutic exercises;DME and/or AE instruction;Compression bandaging;Other (comment)  skin care with low PH lotion   or castor oil during MLD   Consulted and Agree with Plan of Care Patient;Family member/caregiver      Patient will benefit from skilled therapeutic intervention in order to improve the following deficits and impairments:  Decreased endurance, Decreased skin integrity, Decreased knowledge of precautions, Decreased scar mobility, Impaired perceived functional ability, Improper body mechanics, Decreased activity tolerance, Decreased knowledge of use of DME, Impaired flexibility, Decreased balance, Decreased mobility, Difficulty walking, Impaired sensation, Obesity, Decreased range of motion, Increased edema, Pain, Other (comment) (increased fall risk; increased infection risk; increased risk non-healing wounds)  Visit Diagnosis: Lymphedema, not elsewhere classified    Problem List Patient Active Problem List   Diagnosis Date Noted  . CVA (cerebral vascular  accident) ([place]) 09/15/2016  . CVA (cerebral infarction) 02/12/2015  . Peripheral vascular disease, unspecified ([place]) 09/29/2013    Andrey Spearman, MS, OTR/L, [facility] 03/15/17 12:10 PM   [place] MAIN [facility] SERVICES [address] [place], [place], [address] Phone: [phone]   Fax:  [phone]  Name: MICHELE KERLIN MRN: [id number] Date of Birth: Mar 10, 1946

## 2017-03-19 ENCOUNTER — Ambulatory Visit: Payer: Medicare Other | Admitting: Occupational Therapy

## 2017-03-19 ENCOUNTER — Encounter: Payer: Medicare Other | Admitting: Occupational Therapy

## 2017-03-19 DIAGNOSIS — I890 Lymphedema, not elsewhere classified: Secondary | ICD-10-CM | POA: Diagnosis not present

## 2017-03-19 NOTE — Therapy (Addendum)
[place] MAIN [facility] SERVICES [address] [place], [place], [address] Phone: [phone]   Fax:  [phone]  Occupational Therapy Treatment Note and Progress Report  Patient Details  Name: Adam Merritt MRN: [id number] Date of Birth: Nov 24, 1945 No Data Recorded  Encounter Date: 03/19/2017      OT End of Session - 03/19/17 1701    Visit Number 10   Number of Visits 36   Date for OT Re-Evaluation 03/12/17   OT Start Time 0130   OT Stop Time 0235   OT Time Calculation (min) 65 min   Activity Tolerance Patient tolerated treatment well;No increased pain   Behavior During Therapy WFL for tasks assessed/performed      Past Medical History:  Diagnosis Date  . Anemia   . CAD (coronary artery disease)   . Chest pain   . Coronary artery disease   . Diabetes mellitus without complication ([place])   . Esophageal reflux   . Herpes zoster without mention of complication   . Hyperlipidemia   . Hypertension   . Insomnia   . Lumbago   . Neuropathy in diabetes ([place])   . Obstructive chronic bronchitis without exacerbation ([place])   . Other malaise and fatigue   . Stroke ([place])   . Varicose veins     Past Surgical History:  Procedure Laterality Date  . CHOLECYSTECTOMY    . CORONARY ARTERY BYPASS GRAFT      There were no vitals filed for this visit.      Subjective Assessment - 03/19/17 1658    Subjective  Pt returns for OT  visit 10/ 36 for CDT to BLE, Pt is accompanied by his spouse and arrives with compression wraps in place. Pt and spouse have no new complaints.   Patient is accompained by: Family member   Pertinent History DM, OSA, CAROTID artery disease, diabetic neuropathy, Hx TIA, recent Dopler negative for DVT, hx recurrent cellulitis, Obesity, CVI   Limitations difficulty walking, impaired functional mobility, difficulty bathing feet and legs, difficulty fitting lower body clothing and street shoes;  difficulty donning preferred  shoes and socks;    Repetition Increases Symptoms   Patient Stated Goals Get my legs better so I dont have to worry about losing them all the time.    Currently in Pain? No/denies   Pain Onset Other (comment)                              OT Education - 03/19/17 1700    Education provided Yes   Education Details Continued skilled Pt/caregiver education  And LE ADL training throughout visit for lymphedema self care/ home program, including compression wrapping, compression garment and device wear/care, lymphatic pumping ther ex, simple self-MLD, and skin care. Discussed progress towards goals.   Person(s) Educated Patient;Spouse   Methods Explanation;Demonstration;Tactile cues;Verbal cues;Handout   Comprehension Verbalized understanding;Returned demonstration;Verbal cues required;Tactile cues required;Need further instruction             OT Long Term Goals - 03/19/17 1702      OT LONG TERM GOAL #1   Title Pt independent w/ lymphedema precautions and prevention principals and strategies to limit LE progression and infection risk using printed handout for reference to limit lymphedema progression and infection risk.   Baseline dependent  03/19/2017 : Mod A w/ VC   Time 1   Period Weeks   Status Partially Met  OT LONG TERM GOAL #2   Title Lymphedema (LE) management/ self-care: Pt able to apply knee length, multi layered,  gradient compression wraps with maximum caregiver assistance using proper techniques within 2 weeks to achieve optimal limb volume reductions bilaterally.   Baseline dependent  03/19/2017 : Spouse independent using correct techniques   Time 2   Period Weeks   Status Achieved     OT LONG TERM GOAL #3   Title Lymphedema (LE) management/ self-care:  Pt to achieve at least 10% limb volume reduction  bilaterally below the knees during Intensive Phase CDT to limit progression, to reduce leg pain, and to improve ADLs performance, and to facilitate  safe functional mobility and ambulation.   Baseline dependent  03/19/2017 :  Achieved for LLE with  18% limb volume reduction below the knee.    Time 12   Period Weeks   Status Partially Met     OT LONG TERM GOAL #4   Title Lymphedema (LE) management/ self-care:  Pt to tolerate daily compression wraps, compression garments and/ or HOS devices in keeping w/ prescribed wear regime within 1 week of issue date of each to progress and retain clinical and functional gains and to limit LE progression.   Baseline dependent  03/19/2017 : MET for LLE   Time 12   Period Weeks   Status Partially Met     OT LONG TERM GOAL #5   Title Lymphedema (LE) management/ self-care:  During Management Phase CDT Pt to sustain current limb volumes within 5%, and all other clinical gains achieved during OT treatment with needed level of caregiver assistance to limit LE progression, infection risk and further functional decline.   Baseline dependent   Time 6   Period Months   Status On-going     OT LONG TERM GOAL #6   Title Pt to remain wound and nfection free in BLE for 6 months to limit lymphedema progression , to limit non-heealing leg wounds , and to limit increased difficulty w/ functional ambulation and mobility needed for optimal ADLs performance   Baseline dependent   Time 6   Period Weeks   Status New               Plan - 03/19/17 1705    Clinical Impression Statement After a slow start with difficulty with home program compliance, Pt demonstrates steady progress towards all goals for LLE. Spouse is very supportive and assists Pt with all self care protocols. Pt tolerating all aspects of CDT without difficulty. We began discussion re pros and cons of traditional knee length compression stockings vs adjustable, easier to don/ doff alternatives today. Cont as per POC. Commence RLE CDT once garment fitting complete for LLE and condition  remains stable.   Occupational performance deficits (Please  refer to evaluation for details): ADL's;IADL's;Rest and Sleep;Work;Leisure;Social Participation   Rehab Potential Good   Current Impairments/barriers affecting progress: difficulty reaching distal legs and feet for skin infection, dressing, bathing, nail care and skin care. ZUnable to apply compression wraps himself. Will require max caregiver assist for optimal outcome. Deep tissue induration may limit clinical response to CDT. Hx of wounds and  recurrent infection  Falls risk   OT Frequency 3x / week   OT Duration 12 weeks   OT Treatment/Interventions Self-care/ADL training;Therapeutic exercise;Patient/family education;Manual Therapy;Manual lymph drainage;Therapeutic exercises;DME and/or AE instruction;Compression bandaging;Other (comment)  skin care with low PH lotion   or castor oil during MLD   Consulted and Agree with Plan of  Care Patient;Family member/caregiver      Patient will benefit from skilled therapeutic intervention in order to improve the following deficits and impairments:  Decreased endurance, Decreased skin integrity, Decreased knowledge of precautions, Decreased scar mobility, Impaired perceived functional ability, Improper body mechanics, Decreased activity tolerance, Decreased knowledge of use of DME, Impaired flexibility, Decreased balance, Decreased mobility, Difficulty walking, Impaired sensation, Obesity, Decreased range of motion, Increased edema, Pain, Other (comment) (increased fall risk; increased infection risk; increased risk non-healing wounds)  Visit Diagnosis: Lymphedema, not elsewhere classified      G-Codes - March 23, 2017 1705    Functional Assessment Tool Used (Outpatient only) chart review, physical examination, comparative limb volumetrics, interview, observation   Functional Limitation Self care   Self Care Current Status (T6256) At least 60 percent but less than 80 percent impaired, limited or restricted   Self Care Goal Status (L8937) At least 20 percent  but less than 40 percent impaired, limited or restricted      Problem List Patient Active Problem List   Diagnosis Date Noted  . CVA (cerebral vascular accident) ([place]) 09/15/2016  . CVA (cerebral infarction) 02/12/2015  . Peripheral vascular disease, unspecified ([place]) 09/29/2013    Andrey Spearman, MS, OTR/L, [facility] 03/20/17 10:31 AM  [place] MAIN [facility] SERVICES [address] [place], [place], [address] Phone: [phone]   Fax:  [phone]  Name: KAEVION SINCLAIR MRN: [id number] Date of Birth: 1946-02-14

## 2017-03-20 ENCOUNTER — Encounter: Payer: Medicare Other | Admitting: Occupational Therapy

## 2017-03-22 ENCOUNTER — Ambulatory Visit: Payer: Medicare Other | Admitting: Occupational Therapy

## 2017-03-22 ENCOUNTER — Encounter: Payer: Medicare Other | Admitting: Occupational Therapy

## 2017-03-22 DIAGNOSIS — I890 Lymphedema, not elsewhere classified: Secondary | ICD-10-CM | POA: Diagnosis not present

## 2017-03-22 NOTE — Therapy (Signed)
[place] MAIN [facility] SERVICES [address] [place], [place], [address] Phone: [phone]   Fax:  [phone]  Occupational Therapy Treatment  Patient Details  Name: Adam Merritt MRN: [id number] Date of Birth: 1946/02/27 No Data Recorded  Encounter Date: 03/22/2017      OT End of Session - 03/22/17 1501    Visit Number 11   Number of Visits 36   Date for OT Re-Evaluation 03/12/17   OT Start Time 0130   OT Stop Time 0230   OT Time Calculation (min) 60 min   Activity Tolerance Patient tolerated treatment well;No increased pain   Behavior During Therapy WFL for tasks assessed/performed      Past Medical History:  Diagnosis Date  . Anemia   . CAD (coronary artery disease)   . Chest pain   . Coronary artery disease   . Diabetes mellitus without complication ([place])   . Esophageal reflux   . Herpes zoster without mention of complication   . Hyperlipidemia   . Hypertension   . Insomnia   . Lumbago   . Neuropathy in diabetes ([place])   . Obstructive chronic bronchitis without exacerbation ([place])   . Other malaise and fatigue   . Stroke ([place])   . Varicose veins     Past Surgical History:  Procedure Laterality Date  . CHOLECYSTECTOMY    . CORONARY ARTERY BYPASS GRAFT      There were no vitals filed for this visit.      Subjective Assessment - 03/22/17 1455    Subjective  Pt returns for OT  visit 11/ 36 for CDT to BLE, Pt is accompanied by his spouse and arrives with compression wraps in place.    Patient is accompained by: Family member   Pertinent History DM, OSA, CAROTID artery disease, diabetic neuropathy, Hx TIA, recent Dopler negative for DVT, hx recurrent cellulitis, Obesity, CVI   Limitations difficulty walking, impaired functional mobility, difficulty bathing feet and legs, difficulty fitting lower body clothing and street shoes;  difficulty donning preferred shoes and socks;    Repetition Increases Symptoms   Patient  Stated Goals Get my legs better so I dont have to worry about losing them all the time.    Pain Onset Other (comment)                      OT Treatments/Exercises (OP) - 03/22/17 0001      ADLs   ADL Education Given Yes     Manual Therapy   Manual Therapy Edema management;Compression Bandaging   Edema Management completed anatomical measurements -for LLE Circaid Premium Juxtafit afjustable wrap around day time compression knee high and Jobst Relax HOS device medically necesary to reduce new tissue fibrosis formation during HOS.Marland Kitchen       Compression Bandaging Telfa pad placed over open skin shin. Ankle to below Knee (A-D)  LLE gradient compression wraps applied  as follows: Toe wrap omitted. . Knee length cotton stockinett from base of toes to knee. One roll Rasidol soft foam. cumferentially from foot to tibila tuberosity w/ ~ 50% overlap; single 8 cm x 5 m  to foot and ankle, one 12 cm  x 5 m ankle to achilles origin...all applied circumferentially in custommary layered gradient configuration. Fitted w/ 3 x non-slip sock as Pt able to fit shoe over wraps.                 OT Education - 03/22/17 1456  Education provided Yes   Education Details Continued skilled Pt/caregiver education  And LE ADL training throughout visit for lymphedema self care/ home program, including compression wrapping, compression garment and device wear/care, lymphatic pumping ther ex, simple self-MLD, and skin care. Discussed progress towards goals.   Person(s) Educated Patient;Spouse   Methods Explanation;Demonstration   Comprehension Verbalized understanding;Returned demonstration             OT Long Term Goals - 03/19/17 1702      OT LONG TERM GOAL #1   Title Pt independent w/ lymphedema precautions and prevention principals and strategies to limit LE progression and infection risk using printed handout for reference to limit lymphedema progression and infection risk.   Baseline  dependent  03/19/2017 : Mod A w/ VC   Time 1   Period Weeks   Status Partially Met     OT LONG TERM GOAL #2   Title Lymphedema (LE) management/ self-care: Pt able to apply knee length, multi layered,  gradient compression wraps with maximum caregiver assistance using proper techniques within 2 weeks to achieve optimal limb volume reductions bilaterally.   Baseline dependent  03/19/2017 : Spouse independent using correct techniques   Time 2   Period Weeks   Status Achieved     OT LONG TERM GOAL #3   Title Lymphedema (LE) management/ self-care:  Pt to achieve at least 10% limb volume reduction  bilaterally below the knees during Intensive Phase CDT to limit progression, to reduce leg pain, and to improve ADLs performance, and to facilitate safe functional mobility and ambulation.   Baseline dependent  03/19/2017 :  Achieved for LLE with  18% limb volume reduction below the knee.    Time 12   Period Weeks   Status Partially Met     OT LONG TERM GOAL #4   Title Lymphedema (LE) management/ self-care:  Pt to tolerate daily compression wraps, compression garments and/ or HOS devices in keeping w/ prescribed wear regime within 1 week of issue date of each to progress and retain clinical and functional gains and to limit LE progression.   Baseline dependent  03/19/2017 : MET for LLE   Time 12   Period Weeks   Status Partially Met     OT LONG TERM GOAL #5   Title Lymphedema (LE) management/ self-care:  During Management Phase CDT Pt to sustain current limb volumes within 5%, and all other clinical gains achieved during OT treatment with needed level of caregiver assistance to limit LE progression, infection risk and further functional decline.   Baseline dependent   Time 6   Period Months   Status On-going     OT LONG TERM GOAL #6   Title Pt to remain wound and nfection free in BLE for 6 months to limit lymphedema progression , to limit non-heealing leg wounds , and to limit increased  difficulty w/ functional ambulation and mobility needed for optimal ADLs performance   Baseline dependent   Time 6   Period Weeks   Status New               Plan - 03/22/17 1457    Clinical Impression Statement Pt presenting with worsennig superficial wound on anterior leg. Pt/ spouse have not purchased   Telfa non stick pads and recommended. This wound had closed initially, but now opens up each time cotton stockinett is removed. Pt sent to wound clinic today for consult to limit progression. No plan to disco0ntinue OT at this time,  unless advised by Dr. Con Memos. completed anatomical measurements -for LLE Circaid Premium Juxtafit afjustable wrap around day time compression knee high and Jobst Relax HOS device medically necesary to reduce new tissue fibrosis formation during HOS..   Cont as per POC.    Occupational performance deficits (Please refer to evaluation for details): ADL's;IADL's;Rest and Sleep;Work;Leisure;Social Participation   Rehab Potential Good   Current Impairments/barriers affecting progress: difficulty reaching distal legs and feet for skin infection, dressing, bathing, nail care and skin care. ZUnable to apply compression wraps himself. Will require max caregiver assist for optimal outcome. Deep tissue induration may limit clinical response to CDT. Hx of wounds and  recurrent infection  Falls risk   OT Frequency 3x / week   OT Duration 12 weeks   OT Treatment/Interventions Self-care/ADL training;Therapeutic exercise;Patient/family education;Manual Therapy;Manual lymph drainage;Therapeutic exercises;DME and/or AE instruction;Compression bandaging;Other (comment)  skin care with low PH lotion   or castor oil during MLD   Consulted and Agree with Plan of Care Patient;Family member/caregiver      Patient will benefit from skilled therapeutic intervention in order to improve the following deficits and impairments:  Decreased endurance, Decreased skin integrity, Decreased  knowledge of precautions, Decreased scar mobility, Impaired perceived functional ability, Improper body mechanics, Decreased activity tolerance, Decreased knowledge of use of DME, Impaired flexibility, Decreased balance, Decreased mobility, Difficulty walking, Impaired sensation, Obesity, Decreased range of motion, Increased edema, Pain, Other (comment) (increased fall risk; increased infection risk; increased risk non-healing wounds)  Visit Diagnosis: Lymphedema, not elsewhere classified    Problem List Patient Active Problem List   Diagnosis Date Noted  . CVA (cerebral vascular accident) ([place]) 09/15/2016  . CVA (cerebral infarction) 02/12/2015  . Peripheral vascular disease, unspecified ([place]) 09/29/2013   Andrey Spearman, MS, OTR/L, [facility] 03/22/17 3:05 PM  [place] MAIN [facility] SERVICES [address] [place], [place], [address] Phone: [phone]   Fax:  [phone]  Name: Adam Merritt MRN: [id number] Date of Birth: 28-May-1946

## 2017-03-26 ENCOUNTER — Encounter: Payer: Medicare Other | Attending: Surgery | Admitting: Surgery

## 2017-03-26 ENCOUNTER — Encounter: Payer: Medicare Other | Admitting: Occupational Therapy

## 2017-03-26 ENCOUNTER — Ambulatory Visit: Payer: Medicare Other | Admitting: Occupational Therapy

## 2017-03-26 DIAGNOSIS — I6522 Occlusion and stenosis of left carotid artery: Secondary | ICD-10-CM | POA: Diagnosis not present

## 2017-03-26 DIAGNOSIS — E11620 Type 2 diabetes mellitus with diabetic dermatitis: Secondary | ICD-10-CM | POA: Diagnosis not present

## 2017-03-26 DIAGNOSIS — Z6841 Body Mass Index (BMI) 40.0 and over, adult: Secondary | ICD-10-CM | POA: Diagnosis not present

## 2017-03-26 DIAGNOSIS — Z8673 Personal history of transient ischemic attack (TIA), and cerebral infarction without residual deficits: Secondary | ICD-10-CM | POA: Diagnosis not present

## 2017-03-26 DIAGNOSIS — L97221 Non-pressure chronic ulcer of left calf limited to breakdown of skin: Secondary | ICD-10-CM | POA: Diagnosis not present

## 2017-03-26 DIAGNOSIS — Z9221 Personal history of antineoplastic chemotherapy: Secondary | ICD-10-CM | POA: Diagnosis not present

## 2017-03-26 DIAGNOSIS — G4730 Sleep apnea, unspecified: Secondary | ICD-10-CM | POA: Insufficient documentation

## 2017-03-26 DIAGNOSIS — I890 Lymphedema, not elsewhere classified: Secondary | ICD-10-CM | POA: Insufficient documentation

## 2017-03-26 DIAGNOSIS — E785 Hyperlipidemia, unspecified: Secondary | ICD-10-CM | POA: Diagnosis not present

## 2017-03-26 DIAGNOSIS — I2510 Atherosclerotic heart disease of native coronary artery without angina pectoris: Secondary | ICD-10-CM | POA: Diagnosis not present

## 2017-03-26 DIAGNOSIS — G510 Bell's palsy: Secondary | ICD-10-CM | POA: Insufficient documentation

## 2017-03-26 DIAGNOSIS — I110 Hypertensive heart disease with heart failure: Secondary | ICD-10-CM | POA: Diagnosis not present

## 2017-03-26 DIAGNOSIS — I509 Heart failure, unspecified: Secondary | ICD-10-CM | POA: Insufficient documentation

## 2017-03-27 ENCOUNTER — Encounter: Payer: Medicare Other | Admitting: Occupational Therapy

## 2017-03-27 NOTE — Progress Notes (Signed)
DAVIDSON, PALMIERI ([id number]) Visit Report for 03/26/2017 Allergy List Details Patient Name: JERIK, FALLETTA Date of Service: 03/26/2017 3:00 PM Medical Record Number: [id number] Patient Account Number: [id number] Date of Birth/Sex: Jan 12, 1946 (71 y.o. Male) Treating RN: Montey Hora Primary Care Erisha Paugh: [name] Other Clinician: Referring Elaysha Bevard: [name] Treating Keelyn Monjaras/Extender: Frann Rider in Treatment: 0 Allergies Active Allergies Corticosteroids (Glucocorticoids) Allergy Notes Electronic Signature(s) Signed: 03/26/2017 4:16:02 PM By: Montey Hora Entered By: Montey Hora on 03/26/2017 14:53:48 [name] ([id number]) -------------------------------------------------------------------------------- [place] Information Details Patient Name: [name] Date of Service: 03/26/2017 3:00 PM Medical Record Number: [id number] Patient Account Number: [id number] Date of Birth/Sex: [birth year] (71 y.o. Male) Treating RN: Montey Hora Primary Care Rosie Torrez: [name] Other Clinician: Referring Paytience Bures: [name] Treating Djon Tith/Extender: Frann Rider in Treatment: 0 Visit Information Patient Arrived: [name] Arrival Time: 14:49 Accompanied By: spouse Transfer Assistance: None Patient Identification Verified: Yes Secondary Verification Process Completed: Yes Patient Has Alerts: Yes Patient Alerts: DMII History Since Last Visit Added or deleted any medications: No Any new allergies or adverse reactions: No Had a fall or experienced change in activities of daily living that may affect risk of falls: No Signs or symptoms of abuse/neglect since last visito No Hospitalized since last visit: No Has Dressing in Place as Prescribed: Yes Has Compression in Place as Prescribed: Yes Electronic Signature(s) Signed: 03/26/2017 4:16:02 PM By: Montey Hora Entered By: Montey Hora on 03/26/2017 14:51:08 [name]  ([id number]) -------------------------------------------------------------------------------- Clinic Level of Care Assessment Details Patient Name: [name] Date of Service: 03/26/2017 3:00 PM Medical Record Number: [id number] Patient Account Number: [id number] Date of Birth/Sex: 21-May-1946 (71 y.o. Male) Treating RN: Montey Hora Primary Care Monti Villers: [name] Other Clinician: Referring Okema Rollinson: [name] Treating Lavarius Doughten/Extender: Frann Rider in Treatment: 0 Clinic Level of Care Assessment Items TOOL 1 Quantity Score []  - Use when EandM and Procedure is performed on INITIAL visit 0 ASSESSMENTS - Nursing Assessment / Reassessment X - General Physical Exam (combine w/ comprehensive assessment (listed just below) when 1 20 performed on new pt. evals) X- 1 25 Comprehensive Assessment (HX, ROS, Risk Assessments, Wounds Hx, etc.) ASSESSMENTS - Wound and Skin Assessment / Reassessment []  - Dermatologic / Skin Assessment (not related to wound area) 0 ASSESSMENTS - Ostomy and/or Continence Assessment and Care []  - Incontinence Assessment and Management 0 []  - 0 Ostomy Care Assessment and Management (repouching, etc.) PROCESS - Coordination of Care X - Simple Patient / Family Education for ongoing care 1 15 []  - 0 Complex (extensive) Patient / Family Education for ongoing care X- 1 10 Staff obtains Programmer, systems, Records, Test Results / Process Orders []  - 0 Staff telephones HHA, Nursing Homes / Clarify orders / etc []  - 0 Routine Transfer to another Facility (non-emergent condition) []  - 0 Routine Hospital Admission (non-emergent condition) X- 1 15 New Admissions / Biomedical engineer / Ordering NPWT, Apligraf, etc. []  - 0 Emergency Hospital Admission (emergent condition) PROCESS - Special Needs []  - Pediatric / Minor Patient Management 0 []  - 0 Isolation Patient Management []  - 0 Hearing / Language / Visual special needs []  - 0 Assessment of Community  assistance (transportation, D/C planning, etc.) []  - 0 Additional assistance / Altered mentation []  - 0 Support Surface(s) Assessment (bed, cushion, seat, etc.) BILLYJOE, GO ([id number]) INTERVENTIONS - Miscellaneous []  - External ear exam 0 []  - 0 Patient Transfer (multiple staff / Civil Service fast streamer / Similar devices) []  - 0 Simple Staple / Suture  removal (25 or less) []  - 0 Complex Staple / Suture removal (26 or more) []  - 0 Hypo/Hyperglycemic Management (do not check if billed separately) []  - 0 Ankle / Brachial Index (ABI) - do not check if billed separately Has the patient been seen at the hospital within the last three years: Yes Total Score: 85 Level Of Care: New/Established - Level 3 Electronic Signature(s) Signed: 03/26/2017 4:16:02 PM By: Montey Hora Entered By: Montey Hora on 03/26/2017 16:11:56 [name] ([id number]) -------------------------------------------------------------------------------- Compression Therapy Details Patient Name: [name] Date of Service: 03/26/2017 3:00 PM Medical Record Number: [id number] Patient Account Number: [id number] Date of Birth/Sex: [birth year] (71 y.o. Male) Treating RN: Montey Hora Primary Care Linley Moxley: [name] Other Clinician: Referring Keaghan Bowens: [name] Treating Christine Morton/Extender: Frann Rider in Treatment: 0 Compression Therapy Performed for Wound Assessment: Wound #7 Left,Anterior Lower Leg Performed By: Clinician Montey Hora, RN Compression Type: Four Layer Post Procedure Diagnosis Same as Pre-procedure Notes Patient has had vascular testing and is currently being treated at the lymphedema clinic - per Dr Con Memos it is alright to wrap his leg Electronic Signature(s) Signed: 03/26/2017 4:13:05 PM By: Montey Hora Entered By: Montey Hora on 03/26/2017 16:13:05 [name] ([id number]) -------------------------------------------------------------------------------- Encounter  Discharge Information Details Patient Name: [name] Date of Service: 03/26/2017 3:00 PM Medical Record Number: [id number] Patient Account Number: [id number] Date of Birth/Sex: June 15, 1945 (71 y.o. Male) Treating RN: Montey Hora Primary Care Delois Tolbert: [name] Other Clinician: Referring Maricarmen Braziel: [name] Treating Tait Balistreri/Extender: Frann Rider in Treatment: 0 Encounter Discharge Information Items Discharge Pain Level: 0 Discharge Condition: Stable Ambulatory Status: Cane Discharge Destination: Home Transportation: Private Auto Accompanied By: spouse Schedule Follow-up Appointment: Yes Medication Reconciliation completed and No provided to Patient/Care Alohilani Levenhagen: Provided on Clinical Summary of Care: 03/26/2017 Form Type Recipient Paper Patient JT Electronic Signature(s) Signed: 03/26/2017 4:13:49 PM By: Montey Hora Entered By: Montey Hora on 03/26/2017 16:13:49 [name] ([id number]) -------------------------------------------------------------------------------- Lower Extremity Assessment Details Patient Name: [name] Date of Service: 03/26/2017 3:00 PM Medical Record Number: [id number] Patient Account Number: [id number] Date of Birth/Sex: May 03, 1946 (71 y.o. Male) Treating RN: Montey Hora Primary Care Jisele Price: [name] Other Clinician: Referring Congetta Odriscoll: [name] Treating Mckenna Boruff/Extender: Frann Rider in Treatment: 0 Edema Assessment Assessed: [Left: No] [Right: No] Edema: [Left: Ye] [Right: s] Calf Left: Right: Point of Measurement: 34 cm From Medial Instep 54.7 cm cm Ankle Left: Right: Point of Measurement: 12 cm From Medial Instep 36.5 cm cm Vascular Assessment Pulses: Dorsalis Pedis Palpable: [Left:Yes] Doppler Audible: [Left:Yes] Posterior Tibial Palpable: [Left:No] Doppler Audible: [Left:Yes] Extremity colors, hair growth, and conditions: Extremity Color: [Left:Hyperpigmented] Hair Growth on  Extremity: [Left:No] Temperature of Extremity: [Left:Warm] Capillary Refill: [Left:< 3 seconds] Toe Nail Assessment Left: Right: Thick: Yes Discolored: Yes Deformed: No Improper Length and Hygiene: No Electronic Signature(s) Signed: 03/26/2017 4:16:02 PM By: Montey Hora Entered By: Montey Hora on 03/26/2017 15:20:03 [name] ([id number]) -------------------------------------------------------------------------------- Multi Wound Chart Details Patient Name: [name] Date of Service: 03/26/2017 3:00 PM Medical Record Number: [id number] Patient Account Number: [id number] Date of Birth/Sex: [birth year] (71 y.o. 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] [N/A:N/A] Comorbid History: [7:Anemia, Lymphedema, Chronic Obstructive Pulmonary Disease (COPD), Sleep Apnea, Congestive Heart Failure, Coronary Artery Disease, Hypertension, Peripheral Venous Disease, Type II Diabetes, Osteoarthritis, Neuropathy, Received  Chemotherapy] [N/A:N/A] Date Acquired: [7:03/16/2017] [N/A:N/A] Weeks of Treatment: [7:0] [N/A:N/A] Wound Status: [7:Open] [N/A:N/A] Measurements L x W x D [7:1.3x1x0.1] [N/A:N/A] (cm) Area (cm) : [7:1.021] [N/A:N/A] Volume (cm) : [7:0.102] [N/A:N/A] Classification: [7:Partial Thickness] [N/A:N/A] Exudate Amount: [7:Large] [N/A:N/A] Exudate Type: [7:Serous] [N/A:N/A] Exudate Color: [7:amber]  [N/A:N/A] Wound Margin: [7:Flat and Intact] [N/A:N/A] Granulation Amount: [7:Large (67-100%)] [N/A:N/A] Granulation Quality: [7:Pink] [N/A:N/A] Necrotic Amount: [7:None Present (0%)] [N/A:N/A] Exposed Structures: [7:Fascia: No Fat Layer (Subcutaneous Tissue) Exposed: No Tendon: No Muscle: No] [N/A:N/A] Joint: No Bone: No Epithelialization: None N/A N/A Periwound Skin Texture: Excoriation: No N/A N/A Induration: No Callus: No Crepitus: No Rash: No Scarring: No Periwound Skin Moisture: Maceration: No N/A N/A Dry/Scaly: No Periwound Skin Color: Atrophie Blanche: No N/A N/A Cyanosis: No Ecchymosis: No Erythema: No Hemosiderin Staining: No Mottled: No Pallor: No Rubor: No Temperature: No Abnormality N/A N/A Tenderness on Palpation: Yes N/A N/A Wound Preparation: Ulcer Cleansing: N/A N/A Rinsed/Irrigated with Saline Topical Anesthetic Applied: None Treatment Notes Electronic Signature(s) Signed: 03/26/2017 3:32:36 PM By: Christin Fudge MD, FACS Entered By: Christin Fudge on 03/26/2017 15:32:35 [name] ([id number]) -------------------------------------------------------------------------------- [place] Details Patient Name: [name] Date of Service: 03/26/2017 3:00 PM Medical Record Number: [id number] Patient Account Number: [id number] Date of Birth/Sex: 1946/05/17 (71 y.o. Male) Treating RN: Montey Hora Primary Care Dejon Jungman: [name] Other Clinician: Referring Taiwo Fish: [name] Treating Abraham Entwistle/Extender: Frann Rider in Treatment: 0 Active Inactive ` Abuse / Safety / Falls / Self Care Management Nursing Diagnoses: Potential for falls Goals: Patient will remain injury free related to falls Date Initiated: 03/26/2017 Target Resolution Date: 06/09/2017 Goal Status: Active Interventions: Assess fall risk on admission and as needed Notes: ` Orientation to the Wound Care Program Nursing Diagnoses: Knowledge  deficit related to the wound healing center program Goals: Patient/caregiver will verbalize understanding of the [facility] Program Date Initiated: 03/26/2017 Target Resolution Date: 06/09/2017 Goal Status: Active Interventions: Provide education on orientation to the wound center Notes: ` Wound/Skin Impairment Nursing Diagnoses: Impaired tissue integrity Goals: Ulcer/skin breakdown will heal within 14 weeks Date Initiated: 03/26/2017 Target Resolution Date: 07/14/2017 Goal Status: Active Interventions: RYOSUKE, ERICKSEN ([id number]) Assess patient/caregiver ability to obtain necessary supplies Assess patient/caregiver ability to perform ulcer/skin care regimen upon admission and as needed Assess ulceration(s) every visit Notes: Electronic Signature(s) Signed: 03/26/2017 4:16:02 PM By: Montey Hora Entered By: Montey Hora on 03/26/2017 15:21:21 [name] ([id number]) -------------------------------------------------------------------------------- Pain Assessment Details Patient Name: [name] Date of Service: 03/26/2017 3:00 PM Medical Record Number: [id number] Patient Account Number: [id number] Date of Birth/Sex: September 14, 1945 (71 y.o. Male) Treating RN: Montey Hora Primary Care Louden Houseworth: [name] Other Clinician: Referring Ichael Pullara: [name] Treating Hardy Harcum/Extender: Frann Rider in Treatment: 0 Active Problems Location of Pain Severity and Description of Pain Patient Has Paino No Site Locations Pain Management and Medication Current Pain Management: Notes Topical or injectable lidocaine is offered to patient for acute pain when surgical debridement is performed. If needed, Patient is instructed to use over the counter pain medication for the following 24-48 hours after debridement. Wound care MDs do not prescribed pain medications. Patient has chronic pain or uncontrolled pain. Patient has been instructed to make an appointment with  their Primary Care Physician for pain  management. Electronic Signature(s) Signed: 03/26/2017 4:16:02 PM By: Montey Hora Entered By: Montey Hora on 03/26/2017 14:51:29 [name] ([id number]) -------------------------------------------------------------------------------- Patient/Caregiver Education Details Patient Name: [name] Date of Service: 03/26/2017 3:00 PM Medical Record Number: [id number] Patient Account Number: [id number] Date of Birth/Gender: 03-30-1946 (71 y.o. Male) Treating RN: Montey Hora Primary Care Physician: [name] Other Clinician: Referring Physician: [name] Treating Physician/Extender: Frann Rider in Treatment: 0 Education Assessment Education Provided To: Patient Education Topics Provided Venous: Handouts: Other: continue leg elevation Methods: Explain/Verbal Responses: State content correctly Electronic Signature(s) Signed: 03/26/2017 4:16:02 PM By: Montey Hora Entered By: Montey Hora on 03/26/2017 16:14:05 [name] ([id number]) -------------------------------------------------------------------------------- Wound Assessment Details Patient Name: [name] Date of Service: 03/26/2017 3:00 PM Medical Record Number: [id number] Patient Account Number: [id number] Date of Birth/Sex: 11-02-1945 (71 y.o. Male) Treating RN: Montey Hora Primary Care Jameeka Marcy: [name] Other Clinician: Referring Lilian Fuhs: [name] Treating Julietta Batterman/Extender: Frann Rider in Treatment: 0 Wound Status Wound Number: 7 Primary Lymphedema Etiology: Wound Location: Left Lower Leg - Anterior Secondary Diabetic Wound/Ulcer of the Lower Extremity Wounding Event: Gradually Appeared Etiology: Date Acquired: 03/16/2017 Wound Open Weeks Of Treatment: 0 Status: Clustered Wound: No Comorbid Anemia, Lymphedema, Chronic Obstructive History: Pulmonary Disease (COPD), Sleep Apnea, Congestive Heart Failure, Coronary  Artery Disease, Hypertension, Peripheral Venous Disease, Type II Diabetes, Osteoarthritis, Neuropathy, Received Chemotherapy Photos Photo Uploaded By: Montey Hora on 03/26/2017 16:15:53 Wound Measurements Length: (cm) 1.3 Width: (cm) 1 Depth: (cm) 0.1 Area: (cm) 1.021 Volume: (cm) 0.102 % Reduction in Area: % Reduction in Volume: Epithelialization: None Tunneling: No Undermining: No Wound Description Classification: Partial Thickness Wound Margin: Flat and Intact Exudate Amount: Large Exudate Type: Serous Exudate Color: amber Foul Odor After Cleansing: No Slough/Fibrino No Wound Bed Granulation Amount: Large (67-100%) Exposed Structure Granulation Quality: Pink Fascia Exposed: No Necrotic Amount: None Present (0%) Fat Layer (Subcutaneous Tissue) Exposed: No Tendon Exposed: No MAKI, SWEETSER ([id number]) Muscle Exposed: No Joint Exposed: No Bone Exposed: No Periwound Skin Texture Texture Color No Abnormalities Noted: No No Abnormalities Noted: No Callus: No Atrophie Blanche: No Crepitus: No Cyanosis: No Excoriation: No Ecchymosis: No Induration: No Erythema: No Rash: No Hemosiderin Staining: No Scarring: No Mottled: No Pallor: No Moisture Rubor: No No Abnormalities Noted: No Dry / Scaly: No Temperature / Pain Maceration: No Temperature: No Abnormality Tenderness on Palpation: Yes Wound Preparation Ulcer Cleansing: Rinsed/Irrigated with Saline Topical Anesthetic Applied: None Treatment Notes Wound #7 (Left, Anterior Lower Leg) 1. Cleansed with: Clean wound with Normal Saline 4. Dressing Applied: Other dressing (specify in notes) 5. Secondary Dressing Applied ABD Pad 7. Secured with 4-Layer Compression System - Left Lower Extremity Notes [employer] Electronic Signature(s) Signed: 03/26/2017 4:16:02 PM By: Montey Hora Entered By: Montey Hora on 03/26/2017 15:02:57 [name]  ([id number]) -------------------------------------------------------------------------------- [place] Details Patient Name: [name] Date of Service: 03/26/2017 3:00 PM Medical Record Number: [id number] Patient Account Number: [id number] Date of Birth/Sex: 05/12/1946 (71 y.o. Male) Treating RN: Montey Hora Primary Care Daquane Aguilar: [name] Other Clinician: Referring Arlo Butt: [name] Treating Deyja Sochacki/Extender: Frann Rider in Treatment: 0 Vital Signs Time Taken: 14:52 Temperature (F): 98.3 Height (in): 64 Pulse (bpm): 85 Source: Measured Respiratory Rate (breaths/min): 18 Weight (lbs): 298 Blood Pressure (mmHg): 132/81 Source: Measured Reference Range: 80 - 120 mg / dl Body Mass Index (BMI): 51.1 Electronic Signature(s) Signed: 03/26/2017 4:16:02 PM By: Montey Hora Entered By: Montey Hora on 03/26/2017 14:53:42

## 2017-03-27 NOTE — Progress Notes (Signed)
S[place], DIOP ([id number]) Visit Report for 03/26/2017 Abuse/Suicide Risk Screen Details Patient Name: Adam Merritt, Adam Merritt Date of Service: 03/26/2017 3:00 PM Medical Record Number: [id number] Patient Account Number: [id number] Date of Birth/Sex: 1946/01/07 (71 y.o. Male) Treating RN: Montey Hora Primary Care Shima Compere: [name] Other Clinician: Referring Ariannie Penaloza: [name] Treating Katja Blue/Extender: Frann Rider in Treatment: 0 Abuse/Suicide Risk Screen Items Answer ABUSE/SUICIDE RISK SCREEN: Has anyone close to you tried to hurt or harm you recentlyo No Do you feel uncomfortable with anyone in your familyo No Has anyone forced you do things that you didnot want to doo No Do you have any thoughts of harming yourselfo No Patient displays signs or symptoms of abuse and/or neglect. No Electronic Signature(s) Signed: 03/26/2017 4:16:02 PM By: Montey Hora Entered By: Montey Hora on 03/26/2017 14:54:50 Adam Merritt ([id number]) -------------------------------------------------------------------------------- Activities of Daily Living Details Patient Name: Adam Merritt Date of Service: 03/26/2017 3:00 PM Medical Record Number: [id number] Patient Account Number: [id number] Date of Birth/Sex: 05/01/1946 (71 y.o. Male) Treating RN: Montey Hora Primary Care Luetta Piazza: [name] Other Clinician: Referring Steel Kerney: [name] Treating Shanen Norris/Extender: Frann Rider in Treatment: 0 Activities of Daily Living Items Answer Activities of Daily Living (Please select one for each item) Drive Automobile Completely Able Take Medications Completely Able Use Telephone Completely [place] for Appearance Completely Able Use Toilet Completely Able Bath / Shower Completely Able Dress Self Completely Able Feed Self Completely Able Walk Completely Able Get In / Out Bed Completely Able Housework Completely Able Prepare Meals Completely [place] for Self Completely Able Electronic Signature(s) Signed: 03/26/2017 4:16:02 PM By: Montey Hora Entered By: Montey Hora on 03/26/2017 14:55:13 Adam Merritt ([id number]) -------------------------------------------------------------------------------- Education Assessment Details Patient Name: Adam Merritt Date of Service: 03/26/2017 3:00 PM Medical Record Number: [id number] Patient Account Number: [id number] Date of Birth/Sex: April 03, 1946 (71 y.o. Male) Treating RN: Montey Hora Primary Care Akshith Moncus: [name] Other Clinician: Referring Jessamy Torosyan: [name] Treating Terrill Alperin/Extender: Frann Rider in Treatment: 0 Primary Learner Assessed: Patient Learning Preferences/Education Level/Primary Language Learning Preference: Explanation, Demonstration Highest Education Level: High School Preferred Language: English Cognitive Barrier Assessment/Beliefs Language Barrier: No Translator Needed: No Memory Deficit: No Emotional Barrier: No Cultural/Religious Beliefs Affecting Medical Care: No Physical Barrier Assessment Impaired Vision: No Impaired Hearing: No Decreased Hand dexterity: No Knowledge/Comprehension Assessment Knowledge Level: Medium Comprehension Level: Medium Ability to understand written Medium instructions: Ability to understand verbal Medium instructions: Motivation Assessment Anxiety Level: Calm Cooperation: Cooperative Education Importance: Acknowledges Need Interest in Health Problems: Asks Questions Perception: Coherent Willingness to Engage in Self- Medium Management Activities: Readiness to Engage in Self- Medium Management Activities: Electronic Signature(s) Signed: 03/26/2017 4:16:02 PM By: Montey Hora Entered By: Montey Hora on 03/26/2017 14:55:36 Adam Merritt ([id number]) -------------------------------------------------------------------------------- Fall Risk Assessment Details Patient Name:  Adam Merritt Date of Service: 03/26/2017 3:00 PM Medical Record Number: [id number] Patient Account Number: [id number] Date of Birth/Sex: 05-Dec-1945 (71 y.o. Male) Treating RN: Montey Hora Primary Care Arlana Canizales: [name] Other Clinician: Referring Tashe Purdon: [name] Treating Mckala Pantaleon/Extender: Frann Rider in Treatment: 0 Fall Risk Assessment Items Have you had 2 or more falls in the last 12 monthso 0 No Have you had any fall that resulted in injury in the last 12 monthso 0 No FALL RISK ASSESSMENT: History of falling - immediate or within 3 months 0 No Secondary diagnosis 0 No Ambulatory aid None/bed rest/wheelchair/nurse 0 Yes Crutches/cane/walker 0 No Furniture 0 No IV Access/Saline Lock  0 No Gait/Training Normal/bed rest/immobile 0 No Weak 10 Yes Impaired 0 No Mental Status Oriented to own ability 0 Yes Electronic Signature(s) Signed: 03/26/2017 4:16:02 PM By: Montey Hora Entered By: Montey Hora on 03/26/2017 14:55:58 Adam Merritt ([id number]) -------------------------------------------------------------------------------- Foot Assessment Details Patient Name: Adam Merritt Date of Service: 03/26/2017 3:00 PM Medical Record Number: [id number] Patient Account Number: [id number] Date of Birth/Sex: 10/17/1945 (71 y.o. Male) Treating RN: Montey Hora Primary Care Zyria Fiscus: [name] Other Clinician: Referring Martina Brodbeck: [name] Treating Chancey Ringel/Extender: Frann Rider in Treatment: 0 Foot Assessment Items Site Locations + = Sensation present, - = Sensation absent, C = Callus, U = Ulcer R = Redness, W = Warmth, M = Maceration, PU = Pre-ulcerative lesion F = Fissure, S = Swelling, D = Dryness Assessment Right: Left: Other Deformity: No No Prior Foot Ulcer: No No Prior Amputation: No No Charcot Joint: No No Ambulatory Status: Ambulatory With Help Assistance Device: Cane Gait: Steady Electronic Signature(s) Signed: 03/26/2017  4:16:02 PM By: Montey Hora Entered By: Montey Hora on 03/26/2017 14:56:29 Adam Merritt ([id number]) -------------------------------------------------------------------------------- Nutrition Risk Assessment Details Patient Name: Adam Merritt Date of Service: 03/26/2017 3:00 PM Medical Record Number: [id number] Patient Account Number: [id number] Date of Birth/Sex: [birth year] (71 y.o. Male) Treating RN: Montey Hora Primary Care Milynn Quirion: [name] Other Clinician: Referring Ahan Eisenberger: [name] Treating Mannat Benedetti/Extender: Frann Rider in Treatment: 0 Height (in): 64 Weight (lbs): 298 Body Mass Index (BMI): 51.1 Nutrition Risk Assessment Items NUTRITION RISK SCREEN: I have an illness or condition that made me change the kind and/or amount of 0 No food I eat I eat fewer than two meals per day 0 No I eat few fruits and vegetables, or milk products 0 No I have three or more drinks of beer, liquor or wine almost every day 0 No I have tooth or mouth problems that make it hard for me to eat 0 No I don't always have enough money to buy the food I need 0 No I eat alone most of the time 0 No I take three or more different prescribed or over-the-counter drugs a day 1 Yes Without wanting to, I have lost or gained 10 pounds in the last six months 0 No I am not always physically able to shop, cook and/or feed myself 0 No Nutrition Protocols Good Risk Protocol 0 No interventions needed Moderate Risk Protocol Electronic Signature(s) Signed: 03/26/2017 4:16:02 PM By: Montey Hora Entered By: Montey Hora on 03/26/2017 14:56:06

## 2017-03-27 NOTE — Progress Notes (Addendum)
Adam Merritt, Adam Merritt ([id number]) Visit Report for 03/26/2017 Chief Complaint Document Details Patient Name: Adam Merritt, Adam Merritt Date of Service: 03/26/2017 3:00 PM Medical Record Number: [id number] Patient Account Number: [id number] Date of Birth/Sex: Dec 09, 1945 (71 y.o. Male) Treating RN: Adam Merritt Primary Care Provider: [name] Other Clinician: Referring Provider: [name] Treating Provider/Extender: Frann Rider in Treatment: 0 Information Obtained from: Patient Chief Complaint the patient has been at the lymphedema clinic for the last month and recently was noted to have a superficial open wound on the left lower extremity about a week ago Electronic Signature(s) Signed: 03/26/2017 3:33:02 PM By: Christin Fudge MD, FACS Entered By: Christin Fudge on 03/26/2017 15:33:02 Adam Merritt ([id number]) -------------------------------------------------------------------------------- HPI Details Patient Name: Adam Merritt Date of Service: 03/26/2017 3:00 PM Medical Record Number: [id number] Patient Account Number: [id number] Date of Birth/Sex: 1946-02-28 (71 y.o. Male) Treating RN: Adam Merritt Primary Care Provider: [name] Other Clinician: Referring Provider: [name] Treating Provider/Extender: Frann Rider in Treatment: 0 History of Present Illness Location: left lower extremity lymphedema with a small open wound which is fairly superficial on his left anterior calf Quality: Patient reports No Pain. Severity: Patient states wound are getting better Duration: his happened only last week Context: The wound appeared gradually over time Modifying Factors: he is being treated at the lymphedema clinic with regular wrapping of his left lower extremity Associated Signs and Symptoms: Patient reports having difficulty standing for long periods. HPI Description: 71 year old gentleman with chronic bilateral lower extremity lymphedema associated with diabetes  mellitus and obesity has been very compliant with his management and has been going to the lymphedema clinic in the recent past. He now returns with a small superficial ulcer on the left anterior lower calf about a week's duration He has been under the treatment of the lymphedema clinic for the last month. ===== Old notes: 71 year old gentleman seen earlier at our clinic in June 2016 and again in June 2017 is noncompliant with recommendations and does not follow-up regularly. 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 He had referred him to the lymphedema clinic and he was accepted but he did not follow-up with them. At that stage he had agreed to get a repeat arterial study done and he has seen Dr. Trula Slade in [place]. This time around he has been seen by his PCP Dr. Tiburcio Bash who referred him urgently to [place] for bilateral lower extremity cellulitis in spite of being on antibiotics -- he was placed on Keflex and 500 mg 3 times a day for 10 days. His comorbidities include coronary artery disease, diabetes mellitus, hypertension, Bell's palsy and chronic lymphedema. he was also recently admitted to the hospital on April 13 and kept overnight with the diagnosis of a TIA and a left ICA stenosis to follow-up with vascular surgery as an outpatient 10/05/2016 -- patient has been compliant this week and left his compression and dressing on for the entire week and had home health change it appropriately. 11/13/2016 -- his insurance will not cover compression stockings for lymphedema and hence we will have to send him to the lymphedema clinic as soon as we have finished with his healing. 11/20/2016 -- his lymphedema clinic appointment is still pending but other than that he has done very well  and we  are ready to transition him over. 11/27/16 patient's wounds appeared to be completely healed on evaluation today. He still has bilateral left edema and has been referred to the lymphedema clinic although we are still waiting to hear back from them on scheduling. Apparently she has to be discharged from home health services prior to being able to establish with the lymphedema clinic. At this point considering he is completely healed from the standpoint of wounds of the bilateral lower extremities I think it is appropriate to discharge him from home health and initiate weekly wraps on our end. There is no evidence of infection. 01/01/2017 -- the patient was recently discharged on 12/04/2016 and asked to follow-up in the lymphedema clinic. However they seem to be some issues with his going to the lymphedema clinic and then referring him to dermatology for an opinion regarding his skin. Ultimately the patient did not have a follow-up and has now redeveloped wheezing from the right lower extremity and ulceration on the left lower extremity. [name], [name] ([id number]) The patient and his wife are rather helpless and do not seem to be very compliant with their care. 01/08/17 on evaluation today patient has just a single lower extremity wounds noted. Fortunately he is having no significant pain, no bowe or bladder dysfunction, no nausea and no vomiting. There's nothing to indicate a systemic worsening infection. 01/16/17 on a violation today patient appears to be doing fairly well in regard to his bilateral lower extremities. He has a very small ulcer which is barely openable left lower extremity and otherwise is completely closed in regard to both legs. I believe we may be ready to get him back to the lymphedema clinic at this point. ==== Old Notes: 71 year old gentleman who comes with bilateral lower limb edema for over 20 years and recent attack of blisters and redness for about 2 weeks. The patient  says he has had bilateral lower limb edema since 1996. He has been noncompliant with wearing wraps in the past and even was given a boot to wear at night which she never tolerated and didn't wear it. He has a past medical history of sleep apnea, neuropathy with diabetes, hypertension, hyperlipidemia, coronary artery disease, varicose veins, carotid artery disease and is status post CABG and cholecystectomy. He was seen by the vascular surgeon on 09/29/2013 and Dr. Trula Slade opinion was:"I have reviewed his outside ultrasound which shows an ankle-brachial index of 0.72 on the left and 1.0 on the right.o This study was repeated at our office.o The ankle-brachial index on the right was 1.26 with triphasic waveforms.o On the left it was a 1.04 with biphasic waveforms.o The patient had a toe pressure of 121 on the right and 100 on the left. In summary, I would not recommend any further imaging , as I do not think his symptoms are related to arterial insufficiency, but rather secondary to diabetic neuropathy.o The patient still has room for increasing his Neurontin dose, which he states does help him.o I have scheduled him to come back in one year for a repeat arterial evaluation." in June 2016, a year ago, he has a venous duplex study to be done. He was also going to be accepted to the lymphedema clinic but I did not believe the patient went regularly for this. ==== Electronic Signature(s) Signed: 03/26/2017 3:34:58 PM By: Christin Fudge MD, FACS Previous Signature: 03/26/2017 2:58:42 PM Version By: Christin Fudge MD, FACS Previous Signature: 03/26/2017 2:54:35 PM Version By: Christin Fudge MD,  FACS Entered By: Christin Fudge on 03/26/2017 15:34:57 Adam Merritt ([id number]) -------------------------------------------------------------------------------- Physical Exam Details Patient Name: Adam Merritt, Adam Merritt Date of Service: 03/26/2017 3:00 PM Medical Record Number: [id number] Patient Account Number:  [id number] Date of Birth/Sex: Jul 24, 1945 (71 y.o. Male) Treating RN: Adam Merritt Primary Care Provider: [name] Other Clinician: Referring Provider: [name] Treating Provider/Extender: Frann Rider in Treatment: 0 Constitutional . Pulse regular. Respirations normal and unlabored. Afebrile. . Eyes Nonicteric. Reactive to light. Ears, Nose, Mouth, and Throat Lips, teeth, and gums WNL.Marland Kitchen Moist mucosa without lesions. Neck supple and nontender. No palpable supraclavicular or cervical adenopathy. Normal sized without goiter. Respiratory WNL. No retractions.. Cardiovascular Pedal Pulses WNL. No clubbing, cyanosis or edema. Gastrointestinal (GI) Abdomen without masses or tenderness.. No liver or spleen enlargement or tenderness.. Lymphatic No adneopathy. No adenopathy. No adenopathy. Musculoskeletal Adexa without tenderness or enlargement.. Digits and nails w/o clubbing, cyanosis, infection, petechiae, ischemia, or inflammatory conditions.. Integumentary (Hair, Skin) No suspicious lesions. No crepitus or fluctuance. No peri-wound warmth or erythema. No masses.Marland Kitchen Psychiatric Judgement and insight Intact.. No evidence of depression, anxiety, or agitation.. Notes he has a superficial ulceration on the left lateral calf which has been limited to breakdown of the skin. No sharp debridement was required. Lymphedema is looking better than it was before and there is minimal fluid draining from the left lower extremity Electronic Signature(s) Signed: 03/26/2017 3:42:33 PM By: Christin Fudge MD, FACS Entered By: Christin Fudge on 03/26/2017 15:42:31 Adam Merritt ([id number]) -------------------------------------------------------------------------------- Physician Orders Details Patient Name: Adam Merritt Date of Service: 03/26/2017 3:00 PM Medical Record Number: [id number] Patient Account Number: [id number] Date of Birth/Sex: 24-Oct-1945 (71 y.o. Male) Treating RN: Adam Merritt Primary Care Provider: [name] Other Clinician: Referring Provider: [name] Treating Provider/Extender: Frann Rider in Treatment: 0 Verbal / Phone Orders: No Diagnosis Coding Wound Cleansing Wound #7 Left,Anterior Lower Leg o May shower with protection. Primary Wound Dressing Wound #7 Left,Anterior Lower Leg o Silvercel Non-Adherent Secondary Dressing Wound #7 Left,Anterior Lower Leg o ABD pad Dressing Change Frequency Wound #7 Left,Anterior Lower Leg o Change dressing every week Follow-up Appointments Wound #7 Left,Anterior Lower Leg o Return Appointment in 1 week. Edema Control Wound #7 Left,Anterior Lower Leg o 4-Layer Compression System - Left Lower Extremity Additional Orders / Instructions Wound #7 Left,Anterior Lower Leg o Increase protein intake. Electronic Signature(s) Signed: 03/26/2017 4:00:40 PM By: Christin Fudge MD, FACS Signed: 03/26/2017 4:16:02 PM By: Adam Merritt Entered By: Adam Merritt on 03/26/2017 15:22:39 Adam Merritt ([id number]) -------------------------------------------------------------------------------- Problem List Details Patient Name: Adam Merritt Date of Service: 03/26/2017 3:00 PM Medical Record Number: [id number] Patient Account Number: [id number] Date of Birth/Sex: 1945-09-24 (71 y.o. 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 03/26/2017 Yes I89.0 Lymphedema, not elsewhere classified 03/26/2017 Yes E66.01 Morbid (severe) obesity due to excess calories 03/26/2017 Yes L97.221 Non-pressure chronic ulcer of left calf limited to breakdown of skin 03/26/2017 Yes Inactive Problems Resolved Problems Electronic  Signature(s) Signed: 03/26/2017 3:32:30 PM By: Christin Fudge MD, FACS Entered By: Christin Fudge on 03/26/2017 15:32:30 Adam Merritt ([id number]) -------------------------------------------------------------------------------- Progress Note Details Patient Name: Adam Merritt Date of Service: 03/26/2017 3:00 PM Medical Record Number: [id number] Patient Account Number: [id number] Date of Birth/Sex: 09-22-1945 (71 y.o. Male) Treating RN: Adam Merritt Primary Care Provider:  [name] Other Clinician: Referring Provider: [name] Treating Provider/Extender: Frann Rider in Treatment: 0 Subjective Chief Complaint Information obtained from Patient the patient has been at the lymphedema clinic for the last month and recently was noted to have a superficial open wound on the left lower extremity about a week ago History of Present Illness (HPI) The following HPI elements were documented for the patient's wound: Location: left lower extremity lymphedema with a small open wound which is fairly superficial on his left anterior calf Quality: Patient reports No Pain. Severity: Patient states wound are getting better Duration: his happened only last week Context: The wound appeared gradually over time Modifying Factors: he is being treated at the lymphedema clinic with regular wrapping of his left lower extremity Associated Signs and Symptoms: Patient reports having difficulty standing for long periods. 71 year old gentleman with chronic bilateral lower extremity lymphedema associated with diabetes mellitus and obesity has been very compliant with his management and has been going to the lymphedema clinic in the recent past. He now returns with a small superficial ulcer on the left anterior lower calf about a week's duration He has been under the treatment of the lymphedema clinic for the last month. ===== Old notes: 71 year old gentleman seen earlier at our clinic in June 2016  and again in June 2017 is noncompliant with recommendations and does not follow-up regularly. 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 He had referred him to the lymphedema clinic and he was accepted but he did not follow-up with them. At that stage he had agreed to get a repeat arterial study done and he has seen Dr. Trula Slade in [place]. This time around he has been seen by his PCP Dr. Tiburcio Bash who referred him urgently to [place] for bilateral lower extremity cellulitis in spite of being on antibiotics -- he was placed on Keflex and 500 mg 3 times a day for 10 days. His comorbidities include coronary artery disease, diabetes mellitus, hypertension, Bell's palsy and chronic lymphedema. he was also recently admitted to the hospital on April 13 and kept overnight with the diagnosis of a TIA and a left ICA stenosis to follow-up with vascular surgery as an outpatient 10/05/2016 -- patient has been compliant this week and left his compression and dressing on for the entire week and had home health change it appropriately. 11/13/2016 -- his insurance will not cover compression stockings for lymphedema and hence we will have to send him to the lymphedema clinic as soon as we have finished with his healing. 11/20/2016 -- his lymphedema clinic appointment is still pending but other than that he has done very well and we are ready to transition him over. 11/27/16 patient's wounds appeared to be completely healed on evaluation today. He still has bilateral left edema and has EMETT, STAPEL. ([id number]) been referred to the lymphedema clinic although we are still waiting to hear back from them on scheduling. Apparently she has to be discharged from home health services prior to being able to establish  with the lymphedema clinic. At this point considering he is completely healed from the standpoint of wounds of the bilateral lower extremities I think it is appropriate to discharge him from home health and initiate weekly wraps on our end. There is no  evidence of infection. 01/01/2017 -- the patient was recently discharged on 12/04/2016 and asked to follow-up in the lymphedema clinic. However they seem to be some issues with his going to the lymphedema clinic and then referring him to dermatology for an opinion regarding his skin. Ultimately the patient did not have a follow-up and has now redeveloped wheezing from the right lower extremity and ulceration on the left lower extremity. The patient and his wife are rather helpless and do not seem to be very compliant with their care. 01/08/17 on evaluation today patient has just a single lower extremity wounds noted. Fortunately he is having no significant pain, no bowe or bladder dysfunction, no nausea and no vomiting. There's nothing to indicate a systemic worsening infection. 01/16/17 on a violation today patient appears to be doing fairly well in regard to his bilateral lower extremities. He has a very small ulcer which is barely openable left lower extremity and otherwise is completely closed in regard to both legs. I believe we may be ready to get him back to the lymphedema clinic at this point. ==== Old Notes: 71 year old gentleman who comes with bilateral lower limb edema for over 20 years and recent attack of blisters and redness for about 2 weeks. The patient says he has had bilateral lower limb edema since 1996. He has been noncompliant with wearing wraps in the past and even was given a boot to wear at night which she never tolerated and didn't wear it. He has a past medical history of sleep apnea, neuropathy with diabetes, hypertension, hyperlipidemia, coronary artery disease, varicose veins, carotid artery disease and is status post CABG  and cholecystectomy. He was seen by the vascular surgeon on 09/29/2013 and Dr. Trula Slade opinion was:"I have reviewed his outside ultrasound which shows an ankle-brachial index of 0.72 on the left and 1.0 on the right. This study was repeated at our office. The ankle-brachial index on the right was 1.26 with triphasic waveforms. On the left it was a 1.04 with biphasic waveforms. The patient had a toe pressure of 121 on the right and 100 on the left. In summary, I would not recommend any further imaging , as I do not think his symptoms are related to arterial insufficiency, but rather secondary to diabetic neuropathy. The patient still has room for increasing his Neurontin dose, which he states does help him. I have scheduled him to come back in one year for a repeat arterial evaluation." in June 2016, a year ago, he has a venous duplex study to be done. He was also going to be accepted to the lymphedema clinic but I did not believe the patient went regularly for this. ==== Wound History Patient presents with 1 open wound that has been present for approximately 10 days. Patient has been treating wound in the following manner: dry dressing. Laboratory tests have not been performed in the last month. Patient reportedly has not tested positive for an antibiotic resistant organism. Patient reportedly has not tested positive for osteomyelitis. Patient reportedly has had testing performed to evaluate circulation in the legs. Patient History Information obtained from Patient. Allergies Corticosteroids (Glucocorticoids) Family History Cancer - Paternal Grandparents, Kidney Disease - Siblings, Lung Disease - Mother, No family history of Diabetes, Heart Disease, Hereditary Spherocytosis, Hypertension, Seizures, Stroke, Thyroid Problems, Tuberculosis. Social History Never smoker, Marital Status - Married, Alcohol Use - Never, Drug Use - No History, Caffeine Use - Never. Adam Merritt, Adam Merritt  ([id number]) Medical History Hospitalization/Surgery History - 11/03/2013, [facility], bronchitis. Medical  And Surgical History Notes Cardiovascular varicose veins Neurologic CVA - 1992 Objective Constitutional Pulse regular. Respirations normal and unlabored. Afebrile. Vitals Time Taken: 2:52 PM, Height: 64 in, Source: Measured, Weight: 298 lbs, Source: Measured, BMI: 51.1, Temperature: 98.3 F, Pulse: 85 bpm, Respiratory Rate: 18 breaths/min, Blood Pressure: 132/81 mmHg. Eyes Nonicteric. Reactive to light. Ears, Nose, Mouth, and Throat Lips, teeth, and gums WNL.Marland Kitchen Moist mucosa without lesions. Neck supple and nontender. No palpable supraclavicular or cervical adenopathy. Normal sized without goiter. Respiratory WNL. No retractions.. Cardiovascular Pedal Pulses WNL. No clubbing, cyanosis or edema. Gastrointestinal (GI) Abdomen without masses or tenderness.. No liver or spleen enlargement or tenderness.. Lymphatic No adneopathy. No adenopathy. No adenopathy. Musculoskeletal Adexa without tenderness or enlargement.. Digits and nails w/o clubbing, cyanosis, infection, petechiae, ischemia, or inflammatory conditions.Marland Kitchen Psychiatric Judgement and insight Intact.. No evidence of depression, anxiety, or agitation.. General Notes: he has a superficial ulceration on the left lateral calf which has been limited to breakdown of the skin. No sharp debridement was required. Lymphedema is looking better than it was before and there is minimal fluid draining from the Adam Merritt, Adam Merritt. ([id number]) left lower extremity Integumentary (Hair, Skin) No suspicious lesions. No crepitus or fluctuance. No peri-wound warmth or erythema. No masses.. Wound #7 status is Open. Original cause of wound was Gradually Appeared. The wound is located on the Left,Anterior Lower Leg. The wound measures 1.3cm length x 1cm width x 0.1cm depth; 1.021cm^2 area and 0.102cm^3 volume. There is no tunneling or undermining noted.  There is a large amount of serous drainage noted. The wound margin is flat and intact. There is large (67-100%) pink granulation within the wound bed. There is no necrotic tissue within the wound bed. The periwound skin appearance did not exhibit: Callus, Crepitus, Excoriation, Induration, Rash, Scarring, Dry/Scaly, Maceration, Atrophie Blanche, Cyanosis, Ecchymosis, Hemosiderin Staining, Mottled, Pallor, Rubor, Erythema. Periwound temperature was noted as No Abnormality. The periwound has tenderness on palpation. 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 There was a Four Layer Compression Therapy Procedure by Adam Hora, RN. Post procedure Diagnosis Wound #7: Same as Pre-Procedure Notes: Patient has had vascular testing and is currently being treated at the lymphedema clinic - per Dr Con Memos it is alright to wrap his leg. Plan Wound Cleansing: Wound #7 Left,Anterior Lower Leg: May shower with protection. Primary Wound Dressing: Wound #7 Left,Anterior Lower Leg: Silvercel Non-Adherent Secondary Dressing: Wound #7 Left,Anterior Lower Leg: ABD pad Dressing Change Frequency: Wound #7 Left,Anterior Lower Leg: Change dressing every week Adam Merritt, Adam Merritt ([id number]) Follow-up Appointments: Wound #7 Left,Anterior Lower Leg: Return Appointment in 1 week. Edema Control: Wound #7 Left,Anterior Lower Leg: 4-Layer Compression System - Left Lower Extremity Additional Orders / Instructions: Wound #7 Left,Anterior Lower Leg: Increase protein intake. he has been under the care of the occupational therapist at the lymphedema clinic and has been doing  well except for the small superficial ulceration. In the meanwhile, he will need treatment for his ulceration and I have recommended: 1. Silver alginate and a 3 layer Profore wrap to his left lower extremity. 2. elevation and exercise have been again reiterated 3. Good control of his diabetes mellitus 4. reviewed by me next week and follow-up with  the lymphedema clinic as soon as possible Electronic Signature(s) Signed: 03/27/2017 10:34:40 AM By: Christin Fudge MD, FACS Previous Signature: 03/26/2017 3:44:21 PM Version By: Christin Fudge MD, FACS Entered By: Christin Fudge on 03/27/2017 10:34:40 Adam Merritt ([id number]) -------------------------------------------------------------------------------- ROS/PFSH Details Patient Name: Adam Merritt Date of Service: 03/26/2017 3:00 PM Medical Record Number: [id number] Patient Account Number: [id number] Date of Birth/Sex: Aug 30, 1945 (71 y.o. Male) Treating RN: Adam Merritt Primary Care Provider: [name] Other Clinician: Referring Provider: [name] Treating Provider/Extender: Frann Rider in Treatment: 0 Information Obtained From Patient Wound History Do you currently have one or more open woundso Yes How many open wounds do you currently haveo 1 Approximately how long have you had your woundso 10 days How have you been treating your wound(s) until nowo dry dressing Has your wound(s) ever healed and then re-openedo No Have you had any lab work done in the past montho No Have you tested positive for an antibiotic resistant organism (MRSA, VRE)o No Have you tested positive for osteomyelitis (bone infection)o No Have you had any tests for circulation on your legso Yes Where was the test doneo [facility] Eyes Medical History: Negative for: Cataracts; Glaucoma; Optic Neuritis Ear/Nose/Mouth/Throat Medical History: Negative for: Chronic sinus problems/congestion; Middle ear problems Hematologic/Lymphatic Medical  History: Positive for: Anemia - aplastic anemia; Lymphedema Negative for: Hemophilia; Human Immunodeficiency Virus; Sickle Cell Disease Respiratory Medical History: Positive for: Chronic Obstructive Pulmonary Disease (COPD); Sleep Apnea Negative for: Aspiration; Asthma; Pneumothorax; Tuberculosis Cardiovascular Medical History: Positive for: Congestive Heart Failure; Coronary Artery Disease; Hypertension; Peripheral Venous Disease Negative for: Angina; Arrhythmia; Deep Vein Thrombosis; Hypotension; Myocardial Infarction; Peripheral Arterial Disease; Phlebitis; Vasculitis Past Medical History Notes: varicose veins Gastrointestinal Medical History: Negative for: Cirrhosis ; Colitis; Crohnos; Hepatitis A; Hepatitis B; Hepatitis Adam Merritt, Adam Merritt ([id number]) Endocrine Medical History: Positive for: Type II Diabetes Negative for: Type I Diabetes Time with diabetes: since 2006 Treated with: Insulin Blood sugar tested every day: Yes Tested : Blood sugar testing results: Breakfast: 209 Genitourinary Medical History: Negative for: End Stage Renal Disease Immunological Medical History: Negative for: Lupus Erythematosus; Raynaudos; Scleroderma Integumentary (Skin) Medical History: Negative for: History of Burn; History of pressure wounds Musculoskeletal Medical History: Positive for: Osteoarthritis Negative for: Gout; Rheumatoid Arthritis; Osteomyelitis Neurologic Medical History: Positive for: Neuropathy Negative for: Dementia; Quadriplegia; Paraplegia; Seizure Disorder Past Medical History Notes: CVA - 1992 Oncologic Medical History: Positive for: Received Chemotherapy - last dose 2006 Negative for: Received Radiation Psychiatric Medical History: Negative for: Anorexia/bulimia; Confinement Anxiety Immunizations Pneumococcal Vaccine: Received Pneumococcal Vaccination: Yes Implantable Devices Hospitalization / Surgery History Adam Merritt, Adam Merritt ([id number]) Name of  Hospital Purpose of Hospitalization/Surgery Date [facility] bronchitis 11/03/2013 Family and Social History Cancer: Yes - Paternal Grandparents; Diabetes: No; Heart Disease: No; Hereditary Spherocytosis: No; Hypertension: No; Kidney Disease: Yes - Siblings; Lung Disease: Yes - Mother; Seizures: No; Stroke: No; Thyroid Problems: No; Tuberculosis: No; Never smoker; Marital Status - Married; Alcohol Use: Never; Drug Use: No History; Caffeine Use: Never; Financial Concerns: No; Food, Clothing or Shelter Needs: No; Support System Lacking: No; Transportation Concerns: No; Advanced Directives: No; Patient does not want information on Advanced Directives Physician Affirmation I have reviewed and agree with the above information. Electronic Signature(s) Signed: 03/26/2017 4:00:40 PM By: Christin Fudge MD, FACS Signed: 03/26/2017 4:16:02 PM By: Adam Merritt Entered By: Christin Fudge on 03/26/2017 15:31:27 Adam Merritt ([id number]) -------------------------------------------------------------------------------- SuperBill Details Patient Name: Adam Merritt Date of Service: 03/26/2017 Medical Record Number: [id number] Patient Account Number: [id number]  Date of Birth/Sex: Oct 04, 1945 (71 y.o. 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 I89.0 Lymphedema, not elsewhere classified E66.01 Morbid (severe) obesity due to excess calories L97.221 Non-pressure chronic ulcer of left calf limited to breakdown of skin Facility Procedures CPT4 Code: 94320037 Description: [place] VISIT-LEV 3 EST PT Modifier: Quantity: 1 CPT4 Code: 94446190 Description: (Facility Use Only) 29581LT - [place] LWR LT LEG Modifier: Quantity: 1 Physician  Procedures CPT4 Code: 1222411 Description: 46431 - WC PHYS LEVEL 4 - EST PT ICD-10 Diagnosis Description 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

## 2017-03-28 ENCOUNTER — Encounter: Payer: Medicare Other | Admitting: Occupational Therapy

## 2017-03-29 ENCOUNTER — Encounter: Payer: Medicare Other | Admitting: Occupational Therapy

## 2017-03-29 ENCOUNTER — Ambulatory Visit: Payer: Medicare Other | Admitting: Occupational Therapy

## 2017-04-02 ENCOUNTER — Encounter: Payer: Medicare Other | Admitting: Occupational Therapy

## 2017-04-02 ENCOUNTER — Ambulatory Visit: Payer: Medicare Other | Admitting: Occupational Therapy

## 2017-04-02 ENCOUNTER — Encounter: Payer: Medicare Other | Admitting: Surgery

## 2017-04-02 DIAGNOSIS — E11620 Type 2 diabetes mellitus with diabetic dermatitis: Secondary | ICD-10-CM | POA: Diagnosis not present

## 2017-04-03 ENCOUNTER — Encounter: Payer: Medicare Other | Admitting: Occupational Therapy

## 2017-04-03 NOTE — Progress Notes (Addendum)
Adam Merritt, Adam Merritt ([id number]) Visit Report for 04/02/2017 Chief Complaint Document Details Patient Name: Adam Merritt, Adam Merritt Date of Service: 04/02/2017 1:30 PM Medical Record Number: [id number] Patient Account Number: [id number] Date of Birth/Sex: [birth year] (71 y.o. Male) Treating RN: Ahmed Prima Primary Care Provider: [name] Other Clinician: Referring Provider: [name] Treating Provider/Extender: Frann Rider in Treatment: 1 Information Obtained from: Patient Chief Complaint the patient has been at the lymphedema clinic for the last month and recently was noted to have a superficial open wound on the left lower extremity about a week ago Electronic Signature(s) Signed: 04/02/2017 1:58:39 PM By: Christin Fudge MD, FACS Entered By: Christin Fudge on 04/02/2017 13:58:39 Adam Merritt ([id number]) -------------------------------------------------------------------------------- HPI Details Patient Name: Adam Merritt Date of Service: 04/02/2017 1:30 PM Medical Record Number: [id number] Patient Account Number: [id number] Date of Birth/Sex: 10/29/1945 (71 y.o. Male) Treating RN: Ahmed Prima Primary Care Provider: [name] Other Clinician: Referring Provider: [name] Treating Provider/Extender: Frann Rider in Treatment: 1 History of Present Illness Location: left lower extremity lymphedema with a small open wound which is fairly superficial on his left anterior calf Quality: Patient reports No Pain. Severity: Patient states wound are getting better Duration: his happened only last week Context: The wound appeared gradually over time Modifying Factors: he is being treated at the lymphedema clinic with regular wrapping of his left lower extremity Associated Signs and Symptoms: Patient reports having difficulty standing for long periods. HPI Description: 71 year old gentleman with chronic bilateral lower extremity lymphedema associated with diabetes  mellitus and obesity has been very compliant with his management and has been going to the lymphedema clinic in the recent past. He now returns with a small superficial ulcer on the left anterior lower calf about a week's duration He has been under the treatment of the lymphedema clinic for the last month. ===== Old notes: 71 year old gentleman seen earlier at our clinic in June 2016 and again in June 2017 is noncompliant with recommendations and does not follow-up regularly. 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 He had referred him to the lymphedema clinic and he was accepted but he did not follow-up with them. At that stage he had agreed to get a repeat arterial study done and he has seen Dr. Trula Slade in [place]. This time around he has been seen by his PCP Dr. Tiburcio Bash who referred him urgently to [place] for bilateral lower extremity cellulitis in spite of being on antibiotics -- he was placed on Keflex and 500 mg 3 times a day for 10 days. His comorbidities include coronary artery disease, diabetes mellitus, hypertension, Bell's palsy and chronic lymphedema. he was also recently admitted to the hospital on April 13 and kept overnight with the diagnosis of a TIA and a left ICA stenosis to follow-up with vascular surgery as an outpatient 10/05/2016 -- patient has been compliant this week and left his compression and dressing on for the entire week and had home health change it appropriately. 11/13/2016 -- his insurance will not cover compression stockings for lymphedema and hence we will have to send him to the lymphedema clinic as soon as we have finished with his healing. 11/20/2016 -- his lymphedema clinic appointment is still pending but other than that he has done very well  and we  are ready to transition him over. 11/27/16 patient's wounds appeared to be completely healed on evaluation today. He still has bilateral left edema and has been referred to the lymphedema clinic although we are still waiting to hear back from them on scheduling. Apparently she has to be discharged from home health services prior to being able to establish with the lymphedema clinic. At this point considering he is completely healed from the standpoint of wounds of the bilateral lower extremities I think it is appropriate to discharge him from home health and initiate weekly wraps on our end. There is no evidence of infection. 01/01/2017 -- the patient was recently discharged on 12/04/2016 and asked to follow-up in the lymphedema clinic. However they seem to be some issues with his going to the lymphedema clinic and then referring him to dermatology for an opinion regarding his skin. Ultimately the patient did not have a follow-up and has now redeveloped wheezing from the right lower extremity and ulceration on the left lower extremity. Adam Merritt, Adam Merritt ([id number]) The patient and his wife are rather helpless and do not seem to be very compliant with their care. 01/08/17 on evaluation today patient has just a single lower extremity wounds noted. Fortunately he is having no significant pain, no bowe or bladder dysfunction, no nausea and no vomiting. There's nothing to indicate a systemic worsening infection. 01/16/17 on a violation today patient appears to be doing fairly well in regard to his bilateral lower extremities. He has a very small ulcer which is barely openable left lower extremity and otherwise is completely closed in regard to both legs. I believe we may be ready to get him back to the lymphedema clinic at this point. ==== Old Notes: 71 year old gentleman who comes with bilateral lower limb edema for over 20 years and recent attack of blisters and redness for about 2 weeks. The patient  says he has had bilateral lower limb edema since 1996. He has been noncompliant with wearing wraps in the past and even was given a boot to wear at night which she never tolerated and didn't wear it. He has a past medical history of sleep apnea, neuropathy with diabetes, hypertension, hyperlipidemia, coronary artery disease, varicose veins, carotid artery disease and is status post CABG and cholecystectomy. He was seen by the vascular surgeon on 09/29/2013 and Dr. Trula Slade opinion was:"I have reviewed his outside ultrasound which shows an ankle-brachial index of 0.72 on the left and 1.0 on the right.o This study was repeated at our office.o The ankle-brachial index on the right was 1.26 with triphasic waveforms.o On the left it was a 1.04 with biphasic waveforms.o The patient had a toe pressure of 121 on the right and 100 on the left. In summary, I would not recommend any further imaging , as I do not think his symptoms are related to arterial insufficiency, but rather secondary to diabetic neuropathy.o The patient still has room for increasing his Neurontin dose, which he states does help him.o I have scheduled him to come back in one year for a repeat arterial evaluation." in June 2016, a year ago, he has a venous duplex study to be done. He was also going to be accepted to the lymphedema clinic but I did not believe the patient went regularly for this. ==== Electronic Signature(s) Signed: 04/02/2017 1:58:48 PM By: Christin Fudge MD, FACS Entered By: Christin Fudge on 04/02/2017 13:58:47 Adam Merritt ([id number]) -------------------------------------------------------------------------------- Physical Exam Details Patient Name: Adam Merritt. Date  of Service: 04/02/2017 1:30 PM Medical Record Number: [id number] Patient Account Number: [id number] Date of Birth/Sex: August 11, 1945 (71 y.o. Male) Treating RN: Ahmed Prima Primary Care Provider: [name] Other Clinician: Referring Provider:  [name] Treating Provider/Extender: Frann Rider in Treatment: 1 Constitutional . Pulse regular. Respirations normal and unlabored. Afebrile. . Eyes Nonicteric. Reactive to light. Ears, Nose, Mouth, and Throat Lips, teeth, and gums WNL.Marland Kitchen Moist mucosa without lesions. Neck supple and nontender. No palpable supraclavicular or cervical adenopathy. Normal sized without goiter. Respiratory WNL. No retractions.. Cardiovascular Pedal Pulses WNL. No clubbing, cyanosis or edema. Lymphatic No adneopathy. No adenopathy. No adenopathy. Musculoskeletal Adexa without tenderness or enlargement.. Digits and nails w/o clubbing, cyanosis, infection, petechiae, ischemia, or inflammatory conditions.. Integumentary (Hair, Skin) No suspicious lesions. No crepitus or fluctuance. No peri-wound warmth or erythema. No masses.Marland Kitchen Psychiatric Judgement and insight Intact.. No evidence of depression, anxiety, or agitation.. Notes the wound on his left lower extremity is completely healed and is lymphedema is well controlled. Electronic Signature(s) Signed: 04/02/2017 1:59:36 PM By: Christin Fudge MD, FACS Entered By: Christin Fudge on 04/02/2017 13:59:35 Adam Merritt ([id number]) -------------------------------------------------------------------------------- Physician Orders Details Patient Name: Adam Merritt Date of Service: 04/02/2017 1:30 PM Medical Record Number: [id number] Patient Account Number: [id number] Date of Birth/Sex: [birth year] (71 y.o. 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 Electronic Signature(s) Signed: 04/02/2017 4:25:36 PM By: Christin Fudge MD, FACS Signed: 04/04/2017 4:35:14 PM By: Alric Quan Previous Signature: 04/02/2017 2:11:11 PM Version By: Alric Quan Previous Signature: 04/02/2017 2:06:23 PM Version By: Alric Quan Entered By: Alric Quan on 04/02/2017 14:12:14 Adam Merritt ([id number]) -------------------------------------------------------------------------------- Problem List Details Patient Name: Adam Merritt Date of Service: 04/02/2017 1:30 PM Medical Record Number: [id number] Patient Account Number: [id number] Date of Birth/Sex: 03/20/1946 (71 y.o. 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 03/26/2017 Yes I89.0 Lymphedema, not elsewhere classified 03/26/2017 Yes E66.01 Morbid (severe) obesity due to excess calories 03/26/2017 Yes L97.221 Non-pressure chronic ulcer of left calf limited to breakdown of skin 03/26/2017 Yes Inactive Problems Resolved Problems Electronic Signature(s) Signed: 04/02/2017 1:58:24 PM By: Christin Fudge MD, FACS Entered By: Christin Fudge on 04/02/2017 13:58:23 Adam Merritt ([id number]) -------------------------------------------------------------------------------- Progress Note Details Patient Name: Adam Merritt Date of Service: 04/02/2017 1:30 PM Medical Record Number:  [id number] Patient Account Number: [id number] Date of Birth/Sex: [birth year] (71 y.o. Male) Treating RN: Ahmed Prima Primary Care Provider: [name] Other Clinician: Referring Provider: [name] Treating Provider/Extender: Frann Rider in Treatment: 1 Subjective Chief Complaint Information obtained from Patient the patient has been at the lymphedema clinic for the last month and recently was noted to have a superficial open  wound on the left lower extremity about a week ago History of Present Illness (HPI) The following HPI elements were documented for the patient's wound: Location: left lower extremity lymphedema with a small open wound which is fairly superficial on his left anterior calf Quality: Patient reports No Pain. Severity: Patient states wound are getting better Duration: his happened only last week Context: The wound appeared gradually over time Modifying Factors: he is being treated at the lymphedema clinic with regular wrapping of his left lower extremity Associated Signs and Symptoms: Patient reports having difficulty standing for long periods. 70 year old gentleman with chronic bilateral lower extremity lymphedema associated with diabetes mellitus and obesity has been very compliant with his management and has been going to the lymphedema clinic in the recent past. He now returns with a small superficial ulcer on the left anterior lower calf about a week's duration He has been under the treatment of the lymphedema clinic for the last month. ===== Old notes: 71 year old gentleman seen earlier at our clinic in June 2016 and again in June 2017 is noncompliant with recommendations and does not follow-up regularly. 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  He had referred him to the lymphedema clinic and he was accepted but he did not follow-up with them. At that stage he had agreed to get a repeat arterial study done and he has seen Dr. Trula Slade in [place]. This time around he has been seen by his PCP Dr. Tiburcio Bash who referred him urgently to [place] for bilateral lower extremity cellulitis in spite of being on antibiotics -- he was placed on Keflex and 500 mg 3 times a day for 10 days. His comorbidities include coronary artery disease, diabetes mellitus, hypertension, Bell's palsy and chronic lymphedema. he was also recently admitted to the hospital on April 13 and kept overnight with the diagnosis of a TIA and a left ICA stenosis to follow-up with vascular surgery as an outpatient 10/05/2016 -- patient has been compliant this week and left his compression and dressing on for the entire week and had home health change it appropriately. 11/13/2016 -- his insurance will not cover compression stockings for lymphedema and hence we will have to send him to the lymphedema clinic as soon as we have finished with his healing. 11/20/2016 -- his lymphedema clinic appointment is still pending but other than that he has done very well and we are ready to transition him over. 11/27/16 patient's wounds appeared to be completely healed on evaluation today. He still has bilateral left edema and has Adam Merritt, Adam Merritt. ([id number]) been referred to the lymphedema clinic although we are still waiting to hear back from them on scheduling. Apparently she has to be discharged from home health services prior to being able to establish with the lymphedema clinic. At this point considering he is completely healed from the standpoint of wounds of the bilateral lower extremities I think it is appropriate to discharge him from home health and initiate weekly wraps on our end. There is no evidence of infection. 01/01/2017 -- the patient was recently discharged on 12/04/2016 and  asked to follow-up in the lymphedema clinic. However they seem to be some issues with his going to the lymphedema clinic and then referring him to dermatology for an opinion regarding  his skin. Ultimately the patient did not have a follow-up and has now redeveloped wheezing from the right lower extremity and ulceration on the left lower extremity. The patient and his wife are rather helpless and do not seem to be very compliant with their care. 01/08/17 on evaluation today patient has just a single lower extremity wounds noted. Fortunately he is having no significant pain, no bowe or bladder dysfunction, no nausea and no vomiting. There's nothing to indicate a systemic worsening infection. 01/16/17 on a violation today patient appears to be doing fairly well in regard to his bilateral lower extremities. He has a very small ulcer which is barely openable left lower extremity and otherwise is completely closed in regard to both legs. I believe we may be ready to get him back to the lymphedema clinic at this point. ==== Old Notes: 71 year old gentleman who comes with bilateral lower limb edema for over 20 years and recent attack of blisters and redness for about 2 weeks. The patient says he has had bilateral lower limb edema since 1996. He has been noncompliant with wearing wraps in the past and even was given a boot to wear at night which she never tolerated and didn't wear it. He has a past medical history of sleep apnea, neuropathy with diabetes, hypertension, hyperlipidemia, coronary artery disease, varicose veins, carotid artery disease and is status post CABG and cholecystectomy. He was seen by the vascular surgeon on 09/29/2013 and Dr. Trula Slade opinion was:"I have reviewed his outside ultrasound which shows an ankle-brachial index of 0.72 on the left and 1.0 on the right. This study was repeated at our office. The ankle-brachial index on the right was 1.26 with triphasic waveforms. On the left it  was a 1.04 with biphasic waveforms. The patient had a toe pressure of 121 on the right and 100 on the left. In summary, I would not recommend any further imaging , as I do not think his symptoms are related to arterial insufficiency, but rather secondary to diabetic neuropathy. The patient still has room for increasing his Neurontin dose, which he states does help him. I have scheduled him to come back in one year for a repeat arterial evaluation." in June 2016, a year ago, he has a venous duplex study to be done. He was also going to be accepted to the lymphedema clinic but I did not believe the patient went regularly for this. ==== Patient History Information obtained from Patient. Family History Cancer - Paternal Grandparents, Kidney Disease - Siblings, Lung Disease - Mother, No family history of Diabetes, Heart Disease, Hereditary Spherocytosis, Hypertension, Seizures, Stroke, Thyroid Problems, Tuberculosis. Social History Never smoker, Marital Status - Married, Alcohol Use - Never, Drug Use - No History, Caffeine Use - Never. Medical History Hospitalization/Surgery History - 11/03/2013, [facility], bronchitis. Medical And Surgical History Notes Cardiovascular varicose veins Neurologic CVA - [place] ([id number]) Objective Constitutional Pulse regular. Respirations normal and unlabored. Afebrile. Vitals Time Taken: 1:43 PM, Height: 64 in, Weight: 298 lbs, BMI: 51.1, Temperature: 98.3 F, Pulse: 85 bpm, Respiratory Rate: 18 breaths/min, Blood Pressure: 160/89 mmHg. Eyes Nonicteric. Reactive to light. Ears, Nose, Mouth, and Throat Lips, teeth, and gums WNL.Marland Kitchen Moist mucosa without lesions. Neck supple and nontender. No palpable supraclavicular or cervical adenopathy. Normal sized without goiter. Respiratory WNL. No retractions.. Cardiovascular Pedal Pulses WNL. No clubbing, cyanosis or edema. Lymphatic No adneopathy. No adenopathy. No adenopathy. Musculoskeletal Adexa  without tenderness or enlargement.. Digits and nails w/o clubbing, cyanosis, infection, petechiae, ischemia,  or inflammatory conditions.Marland Kitchen Psychiatric Judgement and insight Intact.. No evidence of depression, anxiety, or agitation.. General Notes: the wound on his left lower extremity is completely healed and is lymphedema is well controlled. Integumentary (Hair, Skin) No suspicious lesions. No crepitus or fluctuance. No peri-wound warmth or erythema. No masses.. Wound #7 status is Open. Original cause of wound was Gradually Appeared. The wound is located on the Left,Anterior Lower Leg. The wound measures 0cm length x 0cm width x 0cm depth; 0cm^2 area and 0cm^3 volume. There is no tunneling or undermining noted. There is a none present amount of drainage noted. The wound margin is flat and intact. There is no granulation within the wound bed. There is no necrotic tissue within the wound bed. The periwound skin appearance did not exhibit: Callus, Crepitus, Excoriation, Induration, Rash, Scarring, Dry/Scaly, Maceration, Atrophie Blanche, Cyanosis, Ecchymosis, Hemosiderin Staining, Mottled, Pallor, Rubor, Erythema. Periwound temperature was noted as No Abnormality. The periwound has tenderness on palpation. 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 now that his wound is healed we can send him back under the care of the occupational therapist at the  lymphedema clinic. In the meanwhile, he will need treatment for his ulceration and I have recommended: Silver alginate and a 3 layer Profore wrap to his left lower extremity. elevation and exercise have been again reiterated Good control of his diabetes mellitus Electronic Signature(s) Signed: 04/02/2017 2:51:43 PM By: Christin Fudge MD, FACS Previous Signature: 04/02/2017 2:01:16 PM Version By: Christin Fudge MD, FACS Entered By: Christin Fudge on 04/02/2017 14:51:42 Adam Merritt ([id number]) -------------------------------------------------------------------------------- ROS/PFSH Details Patient Name: Adam Merritt Date of Service: 04/02/2017 1:30 PM Medical Record Number: [id number] Patient Account Number: [id number] Date of Birth/Sex: 1945-07-06 (71 y.o. Male) Treating RN: Ahmed Prima Primary Care Provider: [name] Other Clinician: Referring Provider: [name] Treating Provider/Extender: Frann Rider in Treatment: 1 Information Obtained From Patient Wound History Do you currently have one or more open woundso Yes How many open wounds do you currently haveo 1 Approximately how long have you had your woundso 10 days How have you been treating your wound(s) until nowo dry dressing Has your wound(s) ever healed and then re-openedo No Have you had any lab work done in the past montho No Have you tested positive for an antibiotic resistant organism (MRSA, VRE)o No Have you tested positive for osteomyelitis (bone infection)o No Have you had any tests for circulation on your legso Yes Where was the test doneo [facility] Medical History: Negative for: Cataracts; Glaucoma; Optic Neuritis Ear/Nose/Mouth/Throat Medical History: Negative for: Chronic sinus problems/congestion; Middle ear problems Hematologic/Lymphatic Medical History: Positive for: Anemia - aplastic anemia; Lymphedema Negative for: Hemophilia; Human Immunodeficiency Virus; Sickle Cell  Disease Respiratory Medical History: Positive for: Chronic Obstructive Pulmonary Disease (COPD); Sleep Apnea Negative for: Aspiration; Asthma; Pneumothorax; Tuberculosis Cardiovascular Medical History: Positive for: Congestive Heart Failure; Coronary Artery Disease; Hypertension; Peripheral Venous Disease Negative for: Angina; Arrhythmia; Deep Vein Thrombosis; Hypotension; Myocardial Infarction; Peripheral Arterial Disease; Phlebitis; Vasculitis  Past Medical History Notes: varicose veins Gastrointestinal Medical History: Negative for: Cirrhosis ; Colitis; Crohnos; Hepatitis A; Hepatitis B; Hepatitis Adam Merritt, Adam Merritt ([id number]) Endocrine Medical History: Positive for: Type II Diabetes Negative for: Type I Diabetes Time with diabetes: since 2006 Treated with: Insulin Blood sugar tested every day: Yes Tested : Blood sugar testing results: Breakfast: 209 Genitourinary Medical History: Negative for: End Stage Renal Disease Immunological Medical History: Negative for: Lupus Erythematosus; Raynaudos; Scleroderma Integumentary (Skin) Medical History: Negative for: History of Burn; History of pressure wounds Musculoskeletal Medical History: Positive for: Osteoarthritis Negative for: Gout; Rheumatoid Arthritis; Osteomyelitis Neurologic Medical History: Positive for: Neuropathy Negative for: Dementia; Quadriplegia; Paraplegia; Seizure Disorder Past Medical History Notes: CVA - 1992 Oncologic Medical History: Positive for: Received Chemotherapy - last dose 2006 Negative for: Received Radiation Psychiatric Medical History: Negative for: Anorexia/bulimia; Confinement Anxiety Immunizations Pneumococcal Vaccine: Received Pneumococcal Vaccination: Yes Implantable Devices Hospitalization / Surgery History Adam Merritt, Adam Merritt ([id number]) Name of Hospital Purpose of Hospitalization/Surgery Date [facility] bronchitis 11/03/2013 Family and Social History Cancer: Yes - Paternal  Grandparents; Diabetes: No; Heart Disease: No; Hereditary Spherocytosis: No; Hypertension: No; Kidney Disease: Yes - Siblings; Lung Disease: Yes - Mother; Seizures: No; Stroke: No; Thyroid Problems: No; Tuberculosis: No; Never smoker; Marital Status - Married; Alcohol Use: Never; Drug Use: No History; Caffeine Use: Never; Financial Concerns: No; Food, Clothing or Shelter Needs: No; Support System Lacking: No; Transportation Concerns: No; Advanced Directives: No; Patient does not want information on Advanced Directives Physician Affirmation I have reviewed and agree with the above information. Electronic Signature(s) Signed: 04/02/2017 4:25:36 PM By: Christin Fudge MD, FACS Signed: 04/04/2017 4:35:14 PM By: Alric Quan Entered By: Christin Fudge on 04/02/2017 13:58:59 Adam Merritt ([id number]) -------------------------------------------------------------------------------- Ernest Details Patient Name: Adam Merritt Date of Service: 04/02/2017 Medical Record Number: [id number] Patient Account Number: [id number] Date of Birth/Sex: 1945/12/08 (71 y.o. 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 I89.0 Lymphedema, not elsewhere classified E66.01 Morbid (severe) obesity due to excess calories L97.221 Non-pressure chronic ulcer of left calf limited to breakdown of skin Facility Procedures CPT4 Code: 86754492 Description: (Facility Use Only) [id number] - [place] L[facility] LT LEG Modifier: Quantity: 1 Physician Procedures CPT4 Code: 1975883 Description: 25498 - WC PHYS LEVEL 2 - EST PT ICD-10 Diagnosis Description 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

## 2017-04-05 ENCOUNTER — Encounter: Payer: Medicare Other | Admitting: Occupational Therapy

## 2017-04-05 ENCOUNTER — Ambulatory Visit: Payer: Medicare Other | Admitting: Occupational Therapy

## 2017-04-06 NOTE — Progress Notes (Signed)
Adam Merritt ([id number]) Visit Report for 04/02/2017 Arrival Information Details Patient Name: Adam Merritt, Adam Merritt Date of Service: 04/02/2017 1:30 PM Medical Record Number: [id number] Patient Account Number: [id number] Date of Birth/Sex: [birth year] (71 y.o. Male) Treating RN: Ahmed Prima Primary Care Denora Wysocki: [name] Other Clinician: Referring Jalin Alicea: [name] Treating Yasmin Dibello/Extender: Frann Rider in Treatment: 1 Visit Information History Since Last Visit All ordered tests and consults were completed: No Patient Arrived: [name] Added or deleted any medications: No Arrival Time: 13:42 Any new allergies or adverse reactions: No Accompanied By: wife Had a fall or experienced change in No Transfer Assistance: EasyPivot Patient activities of daily living that may affect Lift risk of falls: Patient Identification Verified: Yes Signs or symptoms of abuse/neglect since last visito No Secondary Verification Process Yes Hospitalized since last visit: No Completed: Has Dressing in Place as Prescribed: Yes Patient Requires Transmission-Based No Precautions: Has Compression in Place as Prescribed: Yes Patient Has Alerts: Yes Pain Present Now: No Patient Alerts: DMII Electronic Signature(s) Signed: 04/04/2017 4:35:14 PM By: Alric Quan Entered By: Alric Quan on 04/02/2017 13:43:10 Adam Merritt ([id number]) -------------------------------------------------------------------------------- Encounter Discharge Information Details Patient Name: Adam Merritt Date of Service: 04/02/2017 1:30 PM Medical Record Number: [id number] Patient Account Number: [id number] Date of Birth/Sex: 1945-07-28 (71 y.o. Male) Treating RN: Ahmed Prima Primary Care Libbey Duce: [name] Other Clinician: Referring Lexton Hidalgo: [name] Treating Angline Schweigert/Extender: Frann Rider in Treatment: 1 Encounter Discharge Information Items Discharge Pain Level: 0 Discharge  Condition: Stable Ambulatory Status: Cane Discharge Destination: Home Transportation: Private Auto Accompanied By: wife Schedule Follow-up Appointment: No Medication Reconciliation completed and No provided to Patient/Care Mazi Brailsford: Provided on Clinical Summary of Care: 04/02/2017 Form Type Recipient Paper Patient JT Electronic Signature(s) Signed: 04/02/2017 2:06:47 PM By: Alric Quan Entered By: Alric Quan on 04/02/2017 14:06:47 Adam Merritt ([id number]) -------------------------------------------------------------------------------- Lower Extremity Assessment Details Patient Name: Adam Merritt Date of Service: 04/02/2017 1:30 PM Medical Record Number: [id number] Patient Account Number: [id number] Date of Birth/Sex: [birth year] (71 y.o. Male) Treating RN: Ahmed Prima Primary Care Finnean Cerami: [name] Other Clinician: Referring Rachelle Edwards: [name] Treating Harlee Eckroth/Extender: Frann Rider in Treatment: 1 Edema Assessment Assessed: [Left: No] [Right: No] [Left: Edema] [Right: :] Calf Left: Right: Point of Measurement: 34 cm From Medial Instep 54.6 cm cm Ankle Left: Right: Point of Measurement: 12 cm From Medial Instep 36.4 cm cm Vascular Assessment Pulses: Dorsalis Pedis Palpable: [Left:Yes] Posterior Tibial Extremity colors, hair growth, and conditions: Extremity Color: [Left:Hyperpigmented] Temperature of Extremity: [Left:Warm] Capillary Refill: [Left:< 3 seconds] Toe Nail Assessment Left: Right: Thick: Yes Discolored: Yes Deformed: No Improper Length and Hygiene: No Electronic Signature(s) Signed: 04/02/2017 2:05:23 PM By: Alric Quan Entered By: Alric Quan on 04/02/2017 14:05:23 Adam Merritt ([id number]) -------------------------------------------------------------------------------- Multi Wound Chart Details Patient Name: Adam Merritt Date of Service: 04/02/2017 1:30 PM Medical Record Number:  [id number] Patient Account Number: [id number] Date of Birth/Sex: 25-Sep-1945 (71 y.o. Male) Treating RN: Ahmed Prima Primary Care Azelyn Batie: [name] Other Clinician: Referring Emmalynne Courtney: [name] Treating Jaegar Croft/Extender: Frann Rider in Treatment: 1 Vital Signs Height(in): 64 Pulse(bpm): 7 Weight(lbs): 298 Blood Pressure(mmHg): 160/89 Body Mass Index(BMI): 51 Temperature(F): 98.3 Respiratory Rate 18 (breaths/min): Photos: [7:No Photos] [N/A:N/A] Wound Location: [7:Left Lower Leg - Anterior] [N/A:N/A] Wounding Event: [7:Gradually Appeared] [N/A:N/A] Primary Etiology: [7:Lymphedema] [N/A:N/A] Secondary Etiology: [7:Diabetic Wound/Ulcer of the Lower Extremity] [N/A:N/A] Comorbid History: [7:Anemia, Lymphedema, Chronic Obstructive Pulmonary Disease (COPD), Sleep Apnea, Congestive Heart Failure, Coronary Artery Disease, Hypertension, Peripheral Venous Disease, Type  II Diabetes, Osteoarthritis, Neuropathy, Received  Chemotherapy] [N/A:N/A] Date Acquired: [7:03/16/2017] [N/A:N/A] Weeks of Treatment: [7:1] [N/A:N/A] Wound Status: [7:Open] [N/A:N/A] Measurements L x W x D [7:0x0x0] [N/A:N/A] (cm) Area (cm) : [7:0] [N/A:N/A] Volume (cm) : [7:0] [N/A:N/A] % Reduction in Area: [7:100.00%] [N/A:N/A] % Reduction in Volume: [7:100.00%] [N/A:N/A] Classification: [7:Partial Thickness] [N/A:N/A] Exudate Amount: [7:None Present] [N/A:N/A] Wound Margin: [7:Flat and Intact] [N/A:N/A] Granulation Amount: [7:None Present (0%)] [N/A:N/A] Necrotic Amount: [7:None Present (0%)] [N/A:N/A] Exposed Structures: [7:Fascia: No Fat Layer (Subcutaneous Tissue) Exposed: No Tendon: No Muscle: No Joint: No Bone: No] [N/A:N/A] Epithelialization: Large (67-100%) N/A N/A Periwound Skin Texture: Excoriation: No N/A N/A Induration: No Callus: No Crepitus: No Rash: No Scarring: No Periwound Skin Moisture: Maceration: No N/A N/A Dry/Scaly: No Periwound Skin Color: Atrophie Blanche: No  N/A N/A Cyanosis: No Ecchymosis: No Erythema: No Hemosiderin Staining: No Mottled: No Pallor: No Rubor: No Temperature: No Abnormality N/A N/A Tenderness on Palpation: Yes N/A N/A Wound Preparation: Ulcer Cleansing: N/A N/A Rinsed/Irrigated with Saline Topical Anesthetic Applied: None Treatment Notes Electronic Signature(s) Signed: 04/02/2017 2:06:09 PM By: Alric Quan Previous Signature: 04/02/2017 1:58:29 PM Version By: Christin Fudge MD, FACS Entered By: Alric Quan on 04/02/2017 14:06:09 Adam Merritt ([id number]) -------------------------------------------------------------------------------- [place] Details Patient Name: Adam Merritt Date of Service: 04/02/2017 1:30 PM Medical Record Number: [id number] Patient Account Number: [id number] Date of Birth/Sex: [birth year] (71 y.o. Male) Treating RN: Ahmed Prima Primary Care Fruma [place]: [name] Other Clinician: Referring Leovanni Bjorkman: [name] Treating Rajanee Schuelke/Extender: Frann Rider in Treatment: 1 Active Inactive Electronic Signature(s) Signed: 04/02/2017 2:05:59 PM By: Alric Quan Previous Signature: 04/02/2017 2:05:52 PM Version By: Alric Quan Entered By: Alric Quan on 04/02/2017 14:05:58 Adam Merritt ([id number]) -------------------------------------------------------------------------------- Pain Assessment Details Patient Name: Adam Merritt Date of Service: 04/02/2017 1:30 PM Medical Record Number: [id number] Patient Account Number: [id number] Date of Birth/Sex: 04/04/1946 (71 y.o. Male) Treating RN: Ahmed Prima Primary Care Taisa Deloria: [name] Other Clinician: Referring Monica Codd: [name] Treating Edward Trevino/Extender: Frann Rider in Treatment: 1 Active Problems Location of Pain Severity and Description of Pain Patient Has Paino No Site Locations Pain Management and Medication Current Pain Management: Electronic  Signature(s) Signed: 04/04/2017 4:35:14 PM By: Alric Quan Entered By: Alric Quan on 04/02/2017 13:43:27 Adam Merritt ([id number]) -------------------------------------------------------------------------------- Patient/Caregiver Education Details Patient Name: Adam Merritt Date of Service: 04/02/2017 1:30 PM Medical Record Number: [id number] Patient Account Number: [id number] Date of Birth/Gender: 1945/12/27 (71 y.o. Male) Treating RN: Ahmed Prima Primary Care Physician: [name] Other Clinician: Referring Physician: [name] Treating Physician/Extender: Frann Rider in Treatment: 1 Education Assessment Education Provided To: Patient Education Topics Provided Wound/Skin Impairment: Handouts: Other: Discharged to lymphedema clinic. Electronic Signature(s) Signed: 04/04/2017 4:35:14 PM By: Alric Quan Entered By: Alric Quan on 04/02/2017 14:07:29 Adam Merritt ([id number]) -------------------------------------------------------------------------------- Wound Assessment Details Patient Name: Adam Merritt Date of Service: 04/02/2017 1:30 PM Medical Record Number: [id number] Patient Account Number: [id number] Date of Birth/Sex: [birth year] (71 y.o. Male) Treating RN: Ahmed Prima Primary Care Daijanae Rafalski: [name] Other Clinician: Referring Ocean Kearley: [name] Treating Chelsei Mcchesney/Extender: Frann Rider in Treatment: 1 Wound Status Wound Number: [id number] Primary Lymphedema Etiology: Wound Location: Left Lower Leg - Anterior Secondary Diabetic Wound/Ulcer of the Lower Extremity Wounding Event: Gradually Appeared Etiology: Date Acquired: 03/16/2017 Wound Open Weeks Of Treatment: 1 Status: Clustered Wound: No Comorbid Anemia, Lymphedema, Chronic Obstructive History: Pulmonary Disease (COPD), Sleep Apnea, Congestive Heart Failure, Coronary Artery Disease, Hypertension, Peripheral Venous Disease, Type II Diabetes,  Osteoarthritis, Neuropathy, Received Chemotherapy Wound Measurements Length: (cm) 0 % Reduct Width: (cm) 0 % Reduct Depth: (cm) 0 Epitheli Area: (cm) 0 Tunneli Volume: (cm) 0 Undermi ion in Area: 100% ion in Volume: 100% alization: Large (67-100%) ng: No ning: No Wound Description Classification: Partial Thickness Foul Odo Wound Margin: Flat and Intact Slough/F Exudate Amount: None Present r After Cleansing: No ibrino No Wound Bed Granulation Amount: None Present (0%) Exposed Structure Necrotic Amount: None Present (0%) Fascia Exposed: No Fat Layer (Subcutaneous Tissue) Exposed: No Tendon Exposed: No Muscle Exposed: No Joint Exposed: No Bone Exposed: No Periwound Skin Texture Texture Color No Abnormalities Noted: No No Abnormalities Noted: No Callus: No Atrophie Blanche: No Crepitus: No Cyanosis: No Excoriation: No Ecchymosis: No Induration: No Erythema: No Rash: No Hemosiderin Staining: No Scarring: No Mottled: No Pallor: No Moisture Rubor: No No Abnormalities Noted: No ARISTOTELIS, VILARDI ([id number]) Dry / Scaly: No Temperature / Pain Maceration: No Temperature: No Abnormality Tenderness on Palpation: Yes Wound Preparation Ulcer Cleansing: Rinsed/Irrigated with Saline Topical Anesthetic Applied: None Electronic Signature(s) Signed: 04/04/2017 4:35:14 PM By: Alric Quan Entered By: Alric Quan on 04/02/2017 14:02:56 Adam Merritt ([id number]) -------------------------------------------------------------------------------- Vitals Details Patient Name: Adam Merritt Date of Service: 04/02/2017 1:30 PM Medical Record Number: [id number] Patient Account Number: [id number] Date of Birth/Sex: February 17, 1946 (71 y.o. Male) Treating RN: Ahmed Prima Primary Care Tamekia Rotter: [name] Other Clinician: Referring Lorence Nagengast: [name] Treating Dwan Fennel/Extender: Frann Rider in Treatment: 1 Vital Signs Time Taken: 13:43 Temperature (F):  98.3 Height (in): 64 Pulse (bpm): 85 Weight (lbs): 298 Respiratory Rate (breaths/min): 18 Body Mass Index (BMI): 51.1 Blood Pressure (mmHg): 160/89 Reference Range: 80 - 120 mg / dl Electronic Signature(s) Signed: 04/04/2017 4:35:14 PM By: Alric Quan Entered By: Alric Quan on 04/02/2017 13:46:59

## 2017-04-09 ENCOUNTER — Ambulatory Visit: Payer: Medicare Other | Admitting: Surgery

## 2017-04-09 ENCOUNTER — Ambulatory Visit: Payer: Medicare Other | Attending: Surgery | Admitting: Occupational Therapy

## 2017-04-09 ENCOUNTER — Ambulatory Visit: Payer: Medicare Other | Admitting: Occupational Therapy

## 2017-04-09 DIAGNOSIS — I890 Lymphedema, not elsewhere classified: Secondary | ICD-10-CM | POA: Diagnosis not present

## 2017-04-09 NOTE — Therapy (Signed)
[place] MAIN [facility] SERVICES [address] [place], [place], [address] Phone: [phone]   Fax:  [phone]  Occupational Therapy Treatment  Patient Details  Name: Adam Merritt MRN: [id number] Date of Birth: December 29, 1945 No Data Recorded  Encounter Date: 04/09/2017  OT End of Session - 04/09/17 1409    Visit Number  12  (Pended)     Number of Visits  41  (Pended)     Date for OT Re-Evaluation  03/12/17  (Pended)     OT Start Time  0100  (Pended)     OT Stop Time  0205  (Pended)     OT Time Calculation (min)  65 min  (Pended)     Activity Tolerance  Patient tolerated treatment well;No increased pain  (Pended)     Behavior During Therapy  WFL for tasks assessed/performed  (Pended)        Past Medical History:  Diagnosis Date  . Anemia   . CAD (coronary artery disease)   . Chest pain   . Coronary artery disease   . Diabetes mellitus without complication ([place])   . Esophageal reflux   . Herpes zoster without mention of complication   . Hyperlipidemia   . Hypertension   . Insomnia   . Lumbago   . Neuropathy in diabetes ([place])   . Obstructive chronic bronchitis without exacerbation ([place])   . Other malaise and fatigue   . Stroke ([place])   . Varicose veins     Past Surgical History:  Procedure Laterality Date  . CHOLECYSTECTOMY    . CORONARY ARTERY BYPASS GRAFT      There were no vitals filed for this visit.  Subjective Assessment - 04/09/17 1305    Subjective   Pt returns for OT  visit 12/ 36 for CDT to BLE, Pt is accompanied by his spouse. Pt was last seen ao OT for LE care on 10/    Patient is accompained by:  Family member    Pertinent History  DM, OSA, CAROTID artery disease, diabetic neuropathy, Hx TIA, recent Dopler negative for DVT, hx recurrent cellulitis, Obesity, CVI    Limitations  difficulty walking, impaired functional mobility, difficulty bathing feet and legs, difficulty fitting lower body clothing and street shoes;   difficulty donning preferred shoes and socks;     Repetition  Increases Symptoms    Patient Stated Goals  Get my legs better so I dont have to worry about losing them all the time.     Currently in Pain?  Yes    Pain Score  -- not rated. Pt reports "I'm sore" after machanical fall forward onto outstretched hands at home.   not rated. Pt reports "I'm sore" after machanical fall forward onto outstretched hands at home.   Pain Onset  Other (comment)                   OT Treatments/Exercises (OP) - 04/09/17 0001      ADLs   ADL Education Given  Yes      Manual Therapy   Manual Therapy  Manual Lymphatic Drainage (MLD);Compression Bandaging;Edema management    Manual therapy comments  skin care to LLE    Manual Lymphatic Drainage (MLD)  MLD to LLE/LLQ in supine utilizing short neck sequence and in tact deep abdominal and ipsilateral inguinal LN. Pt encouraged to perform diaphramatic breathing throughout.    Compression Bandaging  single layer ss wrap over Rosidal foan to mid calf.  Spouse to complete 4 layer wrap at homee as instructed.             OT Education - 04/09/17 1408    Education provided  Yes    Education Details  Cont LE self care training and CG training for compression wrapping, skin acre and self-MLD throufgout session.    Person(s) Educated  Patient;Spouse    Methods  Demonstration;Explanation    Comprehension  Verbalized understanding          OT Long Term Goals - 03/19/17 1702      OT LONG TERM GOAL #1   Title  Pt independent w/ lymphedema precautions and prevention principals and strategies to limit LE progression and infection risk using printed handout for reference to limit lymphedema progression and infection risk.    Baseline  dependent  03/19/2017 : Mod A w/ VC    Time  1    Period  Weeks    Status  Partially Met      OT LONG TERM GOAL #2   Title  Lymphedema (LE) management/ self-care: Pt able to apply knee length, multi layered,   gradient compression wraps with maximum caregiver assistance using proper techniques within 2 weeks to achieve optimal limb volume reductions bilaterally.    Baseline  dependent  03/19/2017 : Spouse independent using correct techniques    Time  2    Period  Weeks    Status  Achieved      OT LONG TERM GOAL #3   Title  Lymphedema (LE) management/ self-care:  Pt to achieve at least 10% limb volume reduction  bilaterally below the knees during Intensive Phase CDT to limit progression, to reduce leg pain, and to improve ADLs performance, and to facilitate safe functional mobility and ambulation.    Baseline  dependent  03/19/2017 :  Achieved for LLE with  18% limb volume reduction below the knee.     Time  12    Period  Weeks    Status  Partially Met      OT LONG TERM GOAL #4   Title  Lymphedema (LE) management/ self-care:  Pt to tolerate daily compression wraps, compression garments and/ or HOS devices in keeping w/ prescribed wear regime within 1 week of issue date of each to progress and retain clinical and functional gains and to limit LE progression.    Baseline  dependent  03/19/2017 : MET for LLE    Time  12    Period  Weeks    Status  Partially Met      OT LONG TERM GOAL #5   Title  Lymphedema (LE) management/ self-care:  During Management Phase CDT Pt to sustain current limb volumes within 5%, and all other clinical gains achieved during OT treatment with needed level of caregiver assistance to limit LE progression, infection risk and further functional decline.    Baseline  dependent    Time  6    Period  Months    Status  On-going      OT LONG TERM GOAL #6   Title  Pt to remain wound and nfection free in BLE for 6 months to limit lymphedema progression , to limit non-heealing leg wounds , and to limit increased difficulty w/ functional ambulation and mobility needed for optimal ADLs performance    Baseline  dependent    Time  6    Period  Weeks    Status  New  Plan - 04/09/17 1556    Clinical Impression Statement  Pt returning to OT today to resume CDT to BLE for moderate lymphedema. Superficial wound on anterior L leg is closed. Leg swelling below the knee is approaching volume observed when Pt was initially evaluated. Pt has not retained limb volumwe reduction goals for LLE as spouse has not been applying short stretch  compression wraps during 3 week interval. Skin hydration , however, is improved from last visit on 10/17. Pt tolerated MLD and skin care to LLE without difficulty, although he slept through most of session. Spouse reporting Pt appears uninjured after fall at home this morning, but Pt has not seen a doctor. Spouse required extensive review for proper compression wrapping technique, despite her having achieved an acceptable level of independence  with application during prior visits. OT issed additional foam today as foam spouse brings to clinic is wet. Applied single 12 cm wrap only as Pt did not bring compression supplies to clinic today. Spouse agrees to add 2 additional wraps once they return home.  Cont as per POC w/ Rx emphasis on LLE.    Occupational performance deficits (Please refer to evaluation for details):  ADL's;IADL's;Rest and Sleep;Leisure;Social Participation    Rehab Potential  Good    Current Impairments/barriers affecting progress:  difficulty reaching distal legs and feet for skin infection, dressing, bathing, nail care and skin care. ZUnable to apply compression wraps himself. Will require max caregiver assist for optimal outcome. Deep tissue induration may limit clinical response to CDT. Hx of wounds and  recurrent infection  Falls risk    OT Frequency  3x / week    OT Duration  12 weeks    OT Treatment/Interventions  Self-care/ADL training;Therapeutic exercise;Patient/family education;Manual Therapy;Manual lymph drainage;Therapeutic exercises;DME and/or AE instruction;Compression bandaging;Other (comment) skin  care with low PH lotion   or castor oil during MLD   skin care with low PH lotion   or castor oil during MLD   Consulted and Agree with Plan of Care  Patient;Family member/caregiver       Patient will benefit from skilled therapeutic intervention in order to improve the following deficits and impairments:  Decreased endurance, Decreased skin integrity, Decreased knowledge of precautions, Decreased scar mobility, Impaired perceived functional ability, Improper body mechanics, Decreased activity tolerance, Decreased knowledge of use of DME, Impaired flexibility, Decreased balance, Decreased mobility, Difficulty walking, Impaired sensation, Obesity, Decreased range of motion, Increased edema, Pain, Other (comment)(increased fall risk; increased infection risk; increased risk non-healing wounds)  Visit Diagnosis: Lymphedema, not elsewhere classified    Problem List Patient Active Problem List   Diagnosis Date Noted  . CVA (cerebral vascular accident) ([place]) 09/15/2016  . CVA (cerebral infarction) 02/12/2015  . Peripheral vascular disease, unspecified ([place]) 09/29/2013    Andrey Spearman, MS, OTR/L, [facility] 04/09/17 4:06 PM  [place] MAIN [facility] SERVICES [address] [place], [place], [address] Phone: [phone]   Fax:  [phone]  Name: THELONIOUS KAUFFMANN MRN: [id number] Date of Birth: [birth year]

## 2017-04-10 ENCOUNTER — Encounter: Payer: Medicare Other | Admitting: Occupational Therapy

## 2017-04-12 ENCOUNTER — Ambulatory Visit: Payer: Medicare Other | Admitting: Occupational Therapy

## 2017-04-12 DIAGNOSIS — I890 Lymphedema, not elsewhere classified: Secondary | ICD-10-CM

## 2017-04-12 NOTE — Therapy (Signed)
[place] MAIN [facility] SERVICES [address] [place], [place], [address] Phone: [phone]   Fax:  [phone]  Occupational Therapy Treatment  Patient Details  Name: Adam Merritt MRN: [id number] Date of Birth: 25-Sep-1945 No Data Recorded  Encounter Date: 04/12/2017  OT End of Session - 04/12/17 1612    Visit Number  13    Number of Visits  36    Date for OT Re-Evaluation  03/12/17    OT Start Time  0737    OT Stop Time  1330    OT Time Calculation (min)  35 min    Activity Tolerance  Patient tolerated treatment well;No increased pain    Behavior During Therapy  WFL for tasks assessed/performed       Past Medical History:  Diagnosis Date  . Anemia   . CAD (coronary artery disease)   . Chest pain   . Coronary artery disease   . Diabetes mellitus without complication ([place])   . Esophageal reflux   . Herpes zoster without mention of complication   . Hyperlipidemia   . Hypertension   . Insomnia   . Lumbago   . Neuropathy in diabetes ([place])   . Obstructive chronic bronchitis without exacerbation ([place])   . Other malaise and fatigue   . Stroke ([place])   . Varicose veins     Past Surgical History:  Procedure Laterality Date  . CHOLECYSTECTOMY    . CORONARY ARTERY BYPASS GRAFT      There were no vitals filed for this visit.  Subjective Assessment - 04/12/17 1340    Subjective   Pt returns for OT  visit 13/ 36 for CDT to BLE, Pt is accompanied by his spouse. Pt denies falls since last visit. He had fallen the morning of his last visit, reporting he fell forward on hi hands. "It took me 40 minurtes to get up."  Pt reports her got up this morning and his L heel was very painful when he stood to walk. Spouse states she was unable to apply wraps due top pain in foot.    Patient is accompained by:  Family member    Pertinent History  DM, OSA, CAROTID artery disease, diabetic neuropathy, Hx TIA, recent Dopler negative for DVT, hx recurrent  cellulitis, Obesity, CVI    Limitations  difficulty walking, impaired functional mobility, difficulty bathing feet and legs, difficulty fitting lower body clothing and street shoes;  difficulty donning preferred shoes and socks;     Repetition  Increases Symptoms    Patient Stated Goals  Get my legs better so I dont have to worry about losing them all the time.     Currently in Pain?  Yes    Pain Score  7     Pain Location  Heel    Pain Orientation  Left    Pain Descriptors / Indicators  Sharp;Sore;Stabbing;Penetrating    Pain Onset  Today    Aggravating Factors   weight bearing, shoes, compression, light palpation    Effect of Pain on Daily Activities  limits ambulation, all ADLs and lymphedema self care                   OT Treatments/Exercises (OP) - 04/12/17 0001      ADLs   ADL Education Given  Yes      Manual Therapy   Manual Therapy  Edema management             OT  Education - 04/12/17 1345    Education provided  Yes    Education Details  Pt and CG edu for positioning in bed  and when legs elevated in long sitting for floating L heel on pillows for pressure relief.    Person(s) Educated  Patient;Spouse    Methods  Explanation;Demonstration    Comprehension  Returned demonstration;Verbalized understanding          OT Long Term Goals - 03/19/17 1702      OT LONG TERM GOAL #1   Title  Pt independent w/ lymphedema precautions and prevention principals and strategies to limit LE progression and infection risk using printed handout for reference to limit lymphedema progression and infection risk.    Baseline  dependent  03/19/2017 : Mod A w/ VC    Time  1    Period  Weeks    Status  Partially Met      OT LONG TERM GOAL #2   Title  Lymphedema (LE) management/ self-care: Pt able to apply knee length, multi layered,  gradient compression wraps with maximum caregiver assistance using proper techniques within 2 weeks to achieve optimal limb volume  reductions bilaterally.    Baseline  dependent  03/19/2017 : Spouse independent using correct techniques    Time  2    Period  Weeks    Status  Achieved      OT LONG TERM GOAL #3   Title  Lymphedema (LE) management/ self-care:  Pt to achieve at least 10% limb volume reduction  bilaterally below the knees during Intensive Phase CDT to limit progression, to reduce leg pain, and to improve ADLs performance, and to facilitate safe functional mobility and ambulation.    Baseline  dependent  03/19/2017 :  Achieved for LLE with  18% limb volume reduction below the knee.     Time  12    Period  Weeks    Status  Partially Met      OT LONG TERM GOAL #4   Title  Lymphedema (LE) management/ self-care:  Pt to tolerate daily compression wraps, compression garments and/ or HOS devices in keeping w/ prescribed wear regime within 1 week of issue date of each to progress and retain clinical and functional gains and to limit LE progression.    Baseline  dependent  03/19/2017 : MET for LLE    Time  12    Period  Weeks    Status  Partially Met      OT LONG TERM GOAL #5   Title  Lymphedema (LE) management/ self-care:  During Management Phase CDT Pt to sustain current limb volumes within 5%, and all other clinical gains achieved during OT treatment with needed level of caregiver assistance to limit LE progression, infection risk and further functional decline.    Baseline  dependent    Time  6    Period  Months    Status  On-going      OT LONG TERM GOAL #6   Title  Pt to remain wound and nfection free in BLE for 6 months to limit lymphedema progression , to limit non-heealing leg wounds , and to limit increased difficulty w/ functional ambulation and mobility needed for optimal ADLs performance    Baseline  dependent    Time  6    Period  Weeks    Status  New            Plan - 04/12/17 1347    Clinical Impression Statement  Pt presenting with painful, 7/ 10 blood blister in L heel. No manual  therapy today so as not to puncture blister. Pt was unable to tolerate comrpession wraups upon rising this morning due to heel pain. OT educated Pt on technique for floating heels on pillows to eliminate pressue , and called wound center to facilitate appointment. Pt instructed to keep off of feet and elevate without pressure on heels until medical appointment. OT encouraged Pt to call wound clinic to check cancellation list frequently to expedite visit. Will not DC OT or cancel future appointments until were advised by referring MD.    Occupational performance deficits (Please refer to evaluation for details):  ADL's;IADL's;Rest and Sleep;Leisure;Work;Social Participation    Rehab Potential  Good    Current Impairments/barriers affecting progress:  difficulty reaching distal legs and feet for skin infection, dressing, bathing, nail care and skin care. ZUnable to apply compression wraps himself. Will require max caregiver assist for optimal outcome. Deep tissue induration may limit clinical response to CDT. Hx of wounds and  recurrent infection  Falls risk    OT Frequency  3x / week    OT Duration  12 weeks    OT Treatment/Interventions  Self-care/ADL training;Therapeutic exercise;Patient/family education;Manual Therapy;Manual lymph drainage;Therapeutic exercises;DME and/or AE instruction;Compression bandaging;Other (comment) skin care with low PH lotion   or castor oil during MLD    Consulted and Agree with Plan of Care  Patient;Family member/caregiver       Patient will benefit from skilled therapeutic intervention in order to improve the following deficits and impairments:  Decreased endurance, Decreased skin integrity, Decreased knowledge of precautions, Decreased scar mobility, Impaired perceived functional ability, Improper body mechanics, Decreased activity tolerance, Decreased knowledge of use of DME, Impaired flexibility, Decreased balance, Decreased mobility, Difficulty walking, Impaired  sensation, Obesity, Decreased range of motion, Increased edema, Pain, Other (comment)(increased fall risk; increased infection risk; increased risk non-healing wounds)  Visit Diagnosis: Lymphedema, not elsewhere classified    Problem List Patient Active Problem List   Diagnosis Date Noted  . CVA (cerebral vascular accident) ([place]) 09/15/2016  . CVA (cerebral infarction) 02/12/2015  . Peripheral vascular disease, unspecified ([place]) 09/29/2013    Andrey Spearman, MS, OTR/L, [facility] 04/12/17 4:19 PM  [place] MAIN [facility] SERVICES [address] [place], [place], [address] Phone: [phone]   Fax:  [phone]  Name: Adam Merritt MRN: [id number] Date of Birth: [birth year]

## 2017-04-12 NOTE — Patient Instructions (Signed)
Limit weight bearing on L heel.  Elevate legs at all times when seated .  Float L heel in bed with pillos under knee and leg to off load pressure on blister. Call wound clinic daily and inquire if they have a cancellation you may attend to expedite care and limit infection risk.

## 2017-04-16 ENCOUNTER — Ambulatory Visit: Payer: Medicare Other | Admitting: Occupational Therapy

## 2017-04-17 ENCOUNTER — Encounter: Payer: Medicare Other | Admitting: Occupational Therapy

## 2017-04-19 ENCOUNTER — Ambulatory Visit: Payer: Medicare Other | Admitting: Occupational Therapy

## 2017-04-20 ENCOUNTER — Encounter: Payer: Medicare Other | Attending: Surgery | Admitting: Surgery

## 2017-04-20 DIAGNOSIS — I110 Hypertensive heart disease with heart failure: Secondary | ICD-10-CM | POA: Insufficient documentation

## 2017-04-20 DIAGNOSIS — G4730 Sleep apnea, unspecified: Secondary | ICD-10-CM | POA: Insufficient documentation

## 2017-04-20 DIAGNOSIS — E1140 Type 2 diabetes mellitus with diabetic neuropathy, unspecified: Secondary | ICD-10-CM | POA: Diagnosis not present

## 2017-04-20 DIAGNOSIS — L97221 Non-pressure chronic ulcer of left calf limited to breakdown of skin: Secondary | ICD-10-CM | POA: Diagnosis not present

## 2017-04-20 DIAGNOSIS — J449 Chronic obstructive pulmonary disease, unspecified: Secondary | ICD-10-CM | POA: Insufficient documentation

## 2017-04-20 DIAGNOSIS — Z9221 Personal history of antineoplastic chemotherapy: Secondary | ICD-10-CM | POA: Diagnosis not present

## 2017-04-20 DIAGNOSIS — I2510 Atherosclerotic heart disease of native coronary artery without angina pectoris: Secondary | ICD-10-CM | POA: Insufficient documentation

## 2017-04-20 DIAGNOSIS — Z794 Long term (current) use of insulin: Secondary | ICD-10-CM | POA: Diagnosis not present

## 2017-04-20 DIAGNOSIS — L89623 Pressure ulcer of left heel, stage 3: Secondary | ICD-10-CM | POA: Insufficient documentation

## 2017-04-20 DIAGNOSIS — I509 Heart failure, unspecified: Secondary | ICD-10-CM | POA: Insufficient documentation

## 2017-04-20 DIAGNOSIS — E11620 Type 2 diabetes mellitus with diabetic dermatitis: Secondary | ICD-10-CM | POA: Insufficient documentation

## 2017-04-20 DIAGNOSIS — M1990 Unspecified osteoarthritis, unspecified site: Secondary | ICD-10-CM | POA: Diagnosis not present

## 2017-04-20 DIAGNOSIS — I890 Lymphedema, not elsewhere classified: Secondary | ICD-10-CM | POA: Insufficient documentation

## 2017-04-22 NOTE — Progress Notes (Signed)
DREVIN, ORTNER ([id number]) Visit Report for 04/20/2017 Chief Complaint Document Details Patient Name: Adam Merritt, Adam Merritt Date of Service: 04/20/2017 11:00 AM Medical Record Number: [id number] Patient Account Number: [id number] Date of Birth/Sex: 1945/09/13 (71 y.o. Male) Treating RN: Montey Hora Primary Care Provider: [name] Other Clinician: Referring Provider: [name] Treating Provider/Extender: Frann Rider in Treatment: 3 Information Obtained from: Patient Chief Complaint the patient was recently discharged and now is back for a pressure ulcer on his left heel which was caused about 2 weeks ago Electronic Signature(s) Signed: 04/20/2017 11:50:49 AM By: Christin Fudge MD, FACS Entered By: Christin Fudge on 04/20/2017 11:50:49 Adam Merritt ([id number]) -------------------------------------------------------------------------------- Debridement Details Patient Name: Adam Merritt Date of Service: 04/20/2017 11:00 AM Medical Record Number: [id number] Patient Account Number: [id number] Date of Birth/Sex: [birth year] (71 y.o. Male) Treating RN: Montey Hora Primary Care Provider: [name] Other Clinician: Referring Provider: [name] Treating Provider/Extender: Frann Rider in Treatment: 3 Debridement Performed for Wound #8 Left Calcaneus Assessment: Performed By: Physician Christin Fudge, MD Debridement: Debridement Severity of Tissue Pre Fat layer exposed Debridement: Pre-procedure Verification/Time Yes - 11:35 Out Taken: Start Time: 11:35 Pain Control: Lidocaine 4% Topical Solution Level: Skin/Subcutaneous Tissue Total Area Debrided (L x W): 2 (cm) x 3.6 (cm) = 7.2 (cm) Tissue and other material Viable, Non-Viable, Eschar, Fibrin/Slough, Skin, Subcutaneous debrided: Instrument: Forceps Bleeding: Minimum Hemostasis Achieved: Pressure End Time: 11:37 Procedural Pain: 0 Post Procedural Pain: 0 Response to Treatment: Procedure was  tolerated well Post Debridement Measurements of Total Wound Length: (cm) 2 Stage: Category/Stage III Width: (cm) 3.6 Depth: (cm) 0.2 Volume: (cm) 1.131 Character of Wound/Ulcer Post Improved Debridement: Severity of Tissue Post Debridement: Fat layer exposed Post Procedure Diagnosis Same as Pre-procedure Electronic Signature(s) Signed: 04/20/2017 11:50:22 AM By: Christin Fudge MD, FACS Signed: 04/20/2017 5:02:13 PM By: Montey Hora Entered By: Christin Fudge on 04/20/2017 11:50:21 Adam Merritt ([id number]) -------------------------------------------------------------------------------- HPI Details Patient Name: Adam Merritt Date of Service: 04/20/2017 11:00 AM Medical Record Number: [id number] Patient Account Number: [id number] Date of Birth/Sex: [birth year] (71 y.o. Male) Treating RN: Montey Hora Primary Care Provider: [name] Other Clinician: Referring Provider: [name] Treating Provider/Extender: Frann Rider in Treatment: 3 History of Present Illness Location: left lower extremity lymphedema with a small open wound which is fairly superficial on his left anterior calf Quality: Patient reports No Pain. Severity: Patient states wound are getting better Duration: his happened only last week Context: The wound appeared gradually over time Modifying Factors: he is being treated at the lymphedema clinic with regular wrapping of his left lower extremity Associated Signs and Symptoms: Patient reports having difficulty standing for long periods. HPI Description: 71 year old gentleman with chronic bilateral lower extremity lymphedema associated with diabetes mellitus and obesity has been very compliant with his management and has been going to the lymphedema clinic in the recent past. He now returns with a small superficial ulcer on the left anterior lower calf about a week's duration He has been under the treatment of the lymphedema clinic for the last  month. 04/20/2017 -- the patient was discharged a few weeks ago for lymphedema with ulceration and is under the care of the lymphedema clinic. However he had a fall about 2 weeks ago where he lost consciousness and was laying on the floor for about an hour. During this time he has sustained a pressure injury to the left heel. Of note he has had a vascular consult by Dr. Annamarie Major -- on 09/29/2013  and Dr. Trula Slade opinion was:"I have reviewed his outside ultrasound which shows an ankle-brachial index of 0.72 on the left and 1.0 on the right.o This study was repeated at our office.o The ankle-brachial index on the right was 1.26 with triphasic waveforms.o On the left it was a 1.04 with biphasic waveforms.o The patient had a toe pressure of 121 on the right and 100 on the left. In summary, I would not recommend any further imaging , as I do not think his symptoms are related to arterial insufficiency, " ===== Old notes: 71 year old gentleman seen earlier at our clinic in June 2016 and again in June 2017 is noncompliant with recommendations and does not follow-up regularly. 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 He had referred him to the lymphedema clinic and he was accepted but he did not follow-up with them. At that stage he had agreed to get a repeat arterial study done and he has seen Dr. Trula Slade in [place]. This time around he has been seen by his PCP Dr. Tiburcio Bash who referred him urgently to [place] for bilateral lower extremity cellulitis in spite of being on antibiotics -- he was placed on Keflex and 500 mg 3 times a day for 10 days. His comorbidities include coronary artery disease, diabetes mellitus, hypertension, Bell's palsy and chronic lymphedema. he was also recently admitted  to the hospital on April 13 and kept overnight with the diagnosis of a TIA and a left ICA stenosis to follow-up with vascular surgery as an outpatient 10/05/2016 -- patient has been compliant this week and left his compression and dressing on for the entire week and had home health change it appropriately. 11/13/2016 -- his insurance will not cover compression stockings for lymphedema and hence we will have to send him to the lymphedema clinic as soon as we have finished with his healing. 11/20/2016 -- his lymphedema clinic appointment is still pending but other than that he has done very well and we are ready to transition him over. 11/27/16 patient's wounds appeared to be completely healed on evaluation today. He still has bilateral left edema and has SAKET, HELLSTROM. ([id number]) been referred to the lymphedema clinic although we are still waiting to hear back from them on scheduling. Apparently she has to be discharged from home health services prior to being able to establish with the lymphedema clinic. At this point considering he is completely healed from the standpoint of wounds of the bilateral lower extremities I think it is appropriate to discharge him from home health and initiate weekly wraps on our end. There is no evidence of infection. 01/01/2017 -- the patient was recently discharged on 12/04/2016 and asked to follow-up in the lymphedema clinic. However they seem to be some issues with his going to the lymphedema clinic and then referring him to dermatology for an opinion regarding his skin. Ultimately the patient did not have a follow-up and has now redeveloped wheezing from the right lower extremity and ulceration on the left lower extremity. The patient and his wife are rather helpless and do not seem to be very compliant with their care. 01/08/17 on evaluation today patient has just a single lower extremity wounds noted. Fortunately he is having no significant pain, no bowe or  bladder dysfunction, no  nausea and no vomiting. There's nothing to indicate a systemic worsening infection. 01/16/17 on a violation today patient appears to be doing fairly well in regard to his bilateral lower extremities. He has a very small ulcer which is barely openable left lower extremity and otherwise is completely closed in regard to both legs. I believe we may be ready to get him back to the lymphedema clinic at this point. ==== Old Notes: 71 year old gentleman who comes with bilateral lower limb edema for over 20 years and recent attack of blisters and redness for about 2 weeks. The patient says he has had bilateral lower limb edema since 1996. He has been noncompliant with wearing wraps in the past and even was given a boot to wear at night which she never tolerated and didn't wear it. He has a past medical history of sleep apnea, neuropathy with diabetes, hypertension, hyperlipidemia, coronary artery disease, varicose veins, carotid artery disease and is status post CABG and cholecystectomy. He was seen by the vascular surgeon on 09/29/2013 and Dr. Trula Slade opinion was:"I have reviewed his outside ultrasound which shows an ankle-brachial index of 0.72 on the left and 1.0 on the right.o This study was repeated at our office.o The ankle-brachial index on the right was 1.26 with triphasic waveforms.o On the left it was a 1.04 with biphasic waveforms.o The patient had a toe pressure of 121 on the right and 100 on the left. In summary, I would not recommend any further imaging , as I do not think his symptoms are related to arterial insufficiency, but rather secondary to diabetic neuropathy.o The patient still has room for increasing his Neurontin dose, which he states does help him.o I have scheduled him to come back in one year for a repeat arterial evaluation." in June 2016, a year ago, he has a venous duplex study to be done. He was also going to be accepted to the lymphedema clinic but  I did not believe the patient went regularly for this. ==== Electronic Signature(s) Signed: 04/20/2017 11:53:52 AM By: Christin Fudge MD, FACS Entered By: Christin Fudge on 04/20/2017 11:53:52 Adam Merritt ([id number]) -------------------------------------------------------------------------------- Physical Exam Details Patient Name: Adam Merritt Date of Service: 04/20/2017 11:00 AM Medical Record Number: [id number] Patient Account Number: [id number] Date of Birth/Sex: [birth year] (71 y.o. Male) Treating RN: Montey Hora Primary Care Provider: [name] Other Clinician: Referring Provider: [name] Treating Provider/Extender: Frann Rider in Treatment: 3 Constitutional . Pulse regular. Respirations normal and unlabored. Afebrile. . Eyes Nonicteric. Reactive to light. Ears, Nose, Mouth, and Throat Lips, teeth, and gums WNL.Marland Kitchen Moist mucosa without lesions. Neck supple and nontender. No palpable supraclavicular or cervical adenopathy. Normal sized without goiter. Respiratory WNL. No retractions.. Cardiovascular Pedal Pulses WNL. No clubbing, cyanosis or edema. Lymphatic No adneopathy. No adenopathy. No adenopathy. Musculoskeletal Adexa without tenderness or enlargement.. Digits and nails w/o clubbing, cyanosis, infection, petechiae, ischemia, or inflammatory conditions.. Integumentary (Hair, Skin) No suspicious lesions. No crepitus or fluctuance. No peri-wound warmth or erythema. No masses.Marland Kitchen Psychiatric Judgement and insight Intact.. No evidence of depression, anxiety, or agitation.. Notes the lymphedema on the left lower extremity still persistent but the new wound on the left heel had some subcutaneous debris and eschar which I sharply removed with the forceps and blunt dissection and sharp dissection. Minimal bleeding controlled with pressure Electronic Signature(s) Signed: 04/20/2017 11:55:17 AM By: Christin Fudge MD, FACS Entered By: Christin Fudge on  04/20/2017 11:55:16 Adam Merritt ([id number]) -------------------------------------------------------------------------------- Physician Orders Details Patient Name: LECOMTE,  [name] Date of Service: 04/20/2017 11:00 AM Medical Record Number: [id number] Patient Account Number: [id number] Date of Birth/Sex: November 23, 1945 (71 y.o. Male) Treating RN: Montey Hora Primary Care Provider: [name] Other Clinician: Referring Provider: [name] Treating Provider/Extender: Frann Rider in Treatment: 3 Verbal / Phone Orders: No Diagnosis Coding Wound Cleansing Wound #8 Left Calcaneus o Clean wound with Normal Saline. o May Shower, gently pat wound dry prior to applying new dressing. Anesthetic Wound #8 Left Calcaneus o Topical Lidocaine 4% cream applied to wound bed prior to debridement Primary Wound Dressing Wound #8 Left Calcaneus o Aquacel Ag Secondary Dressing Wound #8 Left Calcaneus o Dry Gauze o Conform/Kerlix o Foam Dressing Change Frequency Wound #8 Left Calcaneus o Change dressing every day. Follow-up Appointments Wound #8 Left Calcaneus o Return Appointment in 1 week. Edema Control Wound #8 Left Calcaneus o Patient to wear own compression stockings Additional Orders / Instructions Wound #8 Left Calcaneus o Increase protein intake. Electronic Signature(s) Signed: 04/20/2017 4:27:13 PM By: Christin Fudge MD, FACS Entered By: Christin Fudge on 04/20/2017 11:55:26 Adam Merritt ([id number]) ARTICE, BERGERSON ([id number]) -------------------------------------------------------------------------------- Problem List Details Patient Name: Adam Merritt Date of Service: 04/20/2017 11:00 AM Medical Record Number: [id number] Patient Account Number: [id number] Date of Birth/Sex: 06-25-1945 (71 y.o. 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 03/26/2017 Yes I89.0 Lymphedema, not elsewhere classified 03/26/2017 Yes E66.01 Morbid (severe) obesity due to excess calories 03/26/2017 Yes L97.221 Non-pressure chronic ulcer of left calf limited to breakdown of skin 03/26/2017 Yes L89.623 Pressure ulcer of left heel, stage 3 04/20/2017 Yes Inactive Problems Resolved Problems Electronic Signature(s) Signed: 04/20/2017 11:50:03 AM By: Christin Fudge MD, FACS Entered By: Christin Fudge on 04/20/2017 11:50:03 Adam Merritt ([id number]) -------------------------------------------------------------------------------- Progress Note Details Patient Name: Adam Merritt Date of Service: 04/20/2017 11:00 AM Medical Record Number: [id number] Patient Account Number: [id number] Date of Birth/Sex: December 02, 1945 (71 y.o. Male) Treating RN: Montey Hora Primary Care Provider: [name] Other Clinician: Referring Provider: [name] Treating Provider/Extender: Frann Rider in Treatment: 3 Subjective Chief Complaint Information obtained from Patient the patient was recently discharged and now is back for a pressure ulcer on his left heel which was caused about 2 weeks ago History of Present Illness (HPI) The following HPI elements were documented for the patient's wound: Location: left lower extremity lymphedema with a small open wound which is fairly superficial on his left anterior calf Quality: Patient reports No Pain. Severity: Patient states wound are getting better Duration: his happened only last week Context: The wound appeared gradually over time Modifying Factors: he is being treated at the lymphedema clinic with regular wrapping of his left lower extremity Associated Signs and Symptoms: Patient reports having difficulty standing for long  periods. 71 year old gentleman with chronic bilateral lower extremity lymphedema associated with diabetes mellitus and obesity has been very compliant with his management and has been going to the lymphedema clinic in the recent past. He now returns with a small superficial ulcer on the left anterior lower calf about a week's duration He has been under the treatment of the lymphedema clinic for the last month. 04/20/2017 -- the patient was discharged a few weeks ago for lymphedema with ulceration and is under the care of the lymphedema clinic. However he had a fall about  2 weeks ago where he lost consciousness and was laying on the floor for about an hour. During this time he has sustained a pressure injury to the left heel. Of note he has had a vascular consult by Dr. Annamarie Major -- on 09/29/2013 and Dr. Trula Slade opinion was:"I have reviewed his outside ultrasound which shows an ankle-brachial index of 0.72 on the left and 1.0 on the right. This study was repeated at our office. The ankle-brachial index on the right was 1.26 with triphasic waveforms. On the left it was a 1.04 with biphasic waveforms. The patient had a toe pressure of 121 on the right and 100 on the left. In summary, I would not recommend any further imaging , as I do not think his symptoms are related to arterial insufficiency, " ===== Old notes: 71 year old gentleman seen earlier at our clinic in June 2016 and again in June 2017 is noncompliant with recommendations and does not follow-up regularly. 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 He had referred him to the lymphedema clinic and he was accepted but he did not follow-up with them. At that stage he had agreed to get a repeat arterial study done and he has seen Dr.  Trula Slade in [place]. This time around he has been seen by his PCP Dr. Tiburcio Bash who referred him urgently to [place] for bilateral lower extremity cellulitis in spite of being on antibiotics -- he was placed on Keflex and 500 mg 3 times a day for 10 days. His comorbidities include coronary artery disease, diabetes mellitus, hypertension, Bell's palsy and chronic lymphedema. he was also recently admitted to the hospital on April 13 and kept overnight with the diagnosis of a TIA and a left ICA stenosis to follow-up with vascular surgery as an outpatient 10/05/2016 -- patient has been compliant this week and left his compression and dressing on for the entire week and had RYNE, MCTIGUE. ([id number]) home health change it appropriately. 11/13/2016 -- his insurance will not cover compression stockings for lymphedema and hence we will have to send him to the lymphedema clinic as soon as we have finished with his healing. 11/20/2016 -- his lymphedema clinic appointment is still pending but other than that he has done very well and we are ready to transition him over. 11/27/16 patient's wounds appeared to be completely healed on evaluation today. He still has bilateral left edema and has been referred to the lymphedema clinic although we are still waiting to hear back from them on scheduling. Apparently she has to be discharged from home health services prior to being able to establish with the lymphedema clinic. At this point considering he is completely healed from the standpoint of wounds of the bilateral lower extremities I think it is appropriate to discharge him from home health and initiate weekly wraps on our end. There is no evidence of infection. 01/01/2017 -- the patient was recently discharged on 12/04/2016 and asked to follow-up in the lymphedema clinic. However they seem to be some issues with his going to the lymphedema clinic and then referring him to dermatology for an opinion regarding his  skin. Ultimately the patient did not have a follow-up and has now redeveloped wheezing from the right lower extremity and ulceration on the left lower extremity.  The patient and his wife are rather helpless and do not seem to be very compliant with their care. 01/08/17 on evaluation today patient has just a single lower extremity wounds noted. Fortunately he is having no significant pain, no bowe or bladder dysfunction, no nausea and no vomiting. There's nothing to indicate a systemic worsening infection. 01/16/17 on a violation today patient appears to be doing fairly well in regard to his bilateral lower extremities. He has a very small ulcer which is barely openable left lower extremity and otherwise is completely closed in regard to both legs. I believe we may be ready to get him back to the lymphedema clinic at this point. ==== Old Notes: 71 year old gentleman who comes with bilateral lower limb edema for over 20 years and recent attack of blisters and redness for about 2 weeks. The patient says he has had bilateral lower limb edema since 1996. He has been noncompliant with wearing wraps in the past and even was given a boot to wear at night which she never tolerated and didn't wear it. He has a past medical history of sleep apnea, neuropathy with diabetes, hypertension, hyperlipidemia, coronary artery disease, varicose veins, carotid artery disease and is status post CABG and cholecystectomy. He was seen by the vascular surgeon on 09/29/2013 and Dr. Trula Slade opinion was:"I have reviewed his outside ultrasound which shows an ankle-brachial index of 0.72 on the left and 1.0 on the right. This study was repeated at our office. The ankle-brachial index on the right was 1.26 with triphasic waveforms. On the left it was a 1.04 with biphasic waveforms. The patient had a toe pressure of 121 on the right and 100 on the left. In summary, I would not recommend any further imaging , as I do not think his  symptoms are related to arterial insufficiency, but rather secondary to diabetic neuropathy. The patient still has room for increasing his Neurontin dose, which he states does help him. I have scheduled him to come back in one year for a repeat arterial evaluation." in June 2016, a year ago, he has a venous duplex study to be done. He was also going to be accepted to the lymphedema clinic but I did not believe the patient went regularly for this. ==== Patient History Information obtained from Patient. Family History Cancer - Paternal Grandparents, Kidney Disease - Siblings, Lung Disease - Mother, No family history of Diabetes, Heart Disease, Hereditary Spherocytosis, Hypertension, Seizures, Stroke, Thyroid Problems, Tuberculosis. Social History Never smoker, Marital Status - Married, Alcohol Use - Never, Drug Use - No History, Caffeine Use - Never. Medical History KAYDON, HUSBY ([id number]) Hospitalization/Surgery History - 11/03/2013, [facility], bronchitis. Medical And Surgical History Notes Cardiovascular varicose veins Neurologic CVA - 1992 Objective Constitutional Pulse regular. Respirations normal and unlabored. Afebrile. Vitals Time Taken: 11:10 AM, Height: 64 in, Weight: 298 lbs, BMI: 51.1, Temperature: 98.1 F, Pulse: 59 bpm, Respiratory Rate: 18 breaths/min, Blood Pressure: 146/76 mmHg. Eyes Nonicteric. Reactive to light. Ears, Nose, Mouth, and Throat Lips, teeth, and gums WNL.Marland Kitchen Moist mucosa without lesions. Neck supple and nontender. No palpable supraclavicular or cervical adenopathy. Normal sized without goiter. Respiratory WNL. No retractions.. Cardiovascular Pedal Pulses WNL. No clubbing, cyanosis or edema. Lymphatic No adneopathy. No adenopathy. No adenopathy. Musculoskeletal Adexa without tenderness or enlargement.. Digits and nails w/o clubbing, cyanosis, infection, petechiae, ischemia, or inflammatory conditions.Marland Kitchen Psychiatric Judgement and insight Intact.. No  evidence of depression, anxiety, or agitation.. General Notes: the lymphedema on the left lower extremity still persistent but  the new wound on the left heel had some subcutaneous debris and eschar which I sharply removed with the forceps and blunt dissection and sharp dissection. Minimal bleeding controlled with pressure Integumentary (Hair, Skin) KAMUELA, MAGOS. ([id number]) No suspicious lesions. No crepitus or fluctuance. No peri-wound warmth or erythema. No masses.. Wound #8 status is Open. Original cause of wound was Pressure Injury. The wound is located on the Left Calcaneus. The wound measures 2cm length x 3.6cm width x 0.1cm depth; 5.655cm^2 area and 0.565cm^3 volume. There is Fat Layer (Subcutaneous Tissue) Exposed exposed. There is no tunneling or undermining noted. There is a large amount of serous drainage noted. The wound margin is flat and intact. There is medium (34-66%) red granulation within the wound bed. There is a medium (34-66%) amount of necrotic tissue within the wound bed including Eschar and Adherent Slough. The periwound skin appearance did not exhibit: Callus, Crepitus, Excoriation, Induration, Rash, Scarring, Dry/Scaly, Maceration, Atrophie Blanche, Cyanosis, Ecchymosis, Hemosiderin Staining, Mottled, Pallor, Rubor, Erythema. Periwound temperature was noted as No Abnormality. The periwound has tenderness on palpation. 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 There was a Skin/Subcutaneous Tissue Debridement ([id number])  debridement with total area of 7.2 sq cm performed by Christin Fudge, MD. with the following instrument(s): Forceps to remove Viable and Non-Viable tissue/material including Fibrin/Slough, Eschar, Skin, and Subcutaneous after achieving pain control using Lidocaine 4% Topical Solution. A time out was conducted at 11:35, prior to the start of the procedure. A Minimum amount of bleeding was controlled with Pressure. The procedure was tolerated well with a pain level of 0 throughout and a pain level of 0 following the procedure. Post Debridement Measurements: 2cm length x 3.6cm width x 0.2cm depth; 1.131cm^3 volume. Post debridement Stage noted as Category/Stage III. Character of Wound/Ulcer Post Debridement is improved. Severity of Tissue Post Debridement is: Fat layer exposed. Post procedure Diagnosis Wound #8: Same as Pre-Procedure Plan Wound Cleansing: Wound #8 Left Calcaneus: Clean wound with Normal Saline. May Shower, gently pat wound dry prior to applying new dressing. Anesthetic: Wound #8 Left Calcaneus: Topical Lidocaine 4% cream applied to wound bed prior to debridement KOHLE, WINNER ([id number]) Primary Wound Dressing: Wound #8 Left Calcaneus: Aquacel Ag Secondary Dressing: Wound #8 Left Calcaneus: Dry Gauze Conform/Kerlix Foam Dressing Change Frequency: Wound #8 Left Calcaneus: Change dressing every day. Follow-up Appointments: Wound #8 Left Calcaneus: Return Appointment in 1 week. Edema Control: Wound #8 Left Calcaneus: Patient to wear own compression stockings Additional Orders / Instructions: Wound #8 Left Calcaneus: Increase protein intake. 71 year old gentleman who had been recently discharged to the lymphedema clinic now comes back with a pressure injury to the left heel which is a stage III pressure injury and after review today I have recommended: 1. Silver alginate and a heel cup to be applied and changed every other day 2. Offloading has been discussed with  him in great detail 3. Appropriate treatment for now and if the wound does not heal within the reasonable amount of time I believe he would benefit from a repeat arterial duplex study 4. Regular visits to the wound  center. Electronic Signature(s) Signed: 04/20/2017 11:57:02 AM By: Christin Fudge MD, FACS Entered By: Christin Fudge on 04/20/2017 11:57:02 Adam Merritt ([id number]) -------------------------------------------------------------------------------- ROS/PFSH Details Patient Name: Adam Merritt Date of Service: 04/20/2017 11:00 AM Medical Record Number: [id number] Patient Account Number: [id number] Date of Birth/Sex: 09-Jan-1946 (71 y.o. Male) Treating RN: Montey Hora Primary Care Provider: [name] Other Clinician: Referring Provider: [name] Treating Provider/Extender: Frann Rider in Treatment: 3 Information Obtained From Patient Wound History Do you currently have one or more open woundso Yes How many open wounds do you currently haveo 1 Approximately how long have you had your woundso 10 days How have you been treating your wound(s) until nowo dry dressing Has your wound(s) ever healed and then re-openedo No Have you had any lab work done in the past montho No Have you tested positive for an antibiotic resistant organism (MRSA, VRE)o No Have you tested positive for osteomyelitis (bone infection)o No Have you had any tests for circulation on your legso Yes Where was the test doneo [facility] Eyes Medical History: Negative for: Cataracts; Glaucoma; Optic Neuritis Ear/Nose/Mouth/Throat Medical History: Negative for: Chronic sinus problems/congestion; Middle ear problems Hematologic/Lymphatic Medical History: Positive for: Anemia - aplastic anemia; Lymphedema Negative for: Hemophilia; Human Immunodeficiency Virus; Sickle Cell Disease Respiratory Medical History: Positive for: Chronic Obstructive Pulmonary Disease (COPD); Sleep Apnea Negative for:  Aspiration; Asthma; Pneumothorax; Tuberculosis Cardiovascular Medical History: Positive for: Congestive Heart Failure; Coronary Artery Disease; Hypertension; Peripheral Venous Disease Negative for: Angina; Arrhythmia; Deep Vein Thrombosis; Hypotension; Myocardial Infarction; Peripheral Arterial Disease; Phlebitis; Vasculitis Past Medical History Notes: varicose veins Gastrointestinal Medical History: Negative for: Cirrhosis ; Colitis; Crohnos; Hepatitis A; Hepatitis B; Hepatitis AIDYN, KELLIS ([id number]) Endocrine Medical History: Positive for: Type II Diabetes Negative for: Type I Diabetes Time with diabetes: since 2006 Treated with: Insulin Blood sugar tested every day: Yes Tested : Blood sugar testing results: Breakfast: 209 Genitourinary Medical History: Negative for: End Stage Renal Disease Immunological Medical History: Negative for: Lupus Erythematosus; Raynaudos; Scleroderma Integumentary (Skin) Medical History: Negative for: History of Burn; History of pressure wounds Musculoskeletal Medical History: Positive for: Osteoarthritis Negative for: Gout; Rheumatoid Arthritis; Osteomyelitis Neurologic Medical History: Positive for: Neuropathy Negative for: Dementia; Quadriplegia; Paraplegia; Seizure Disorder Past Medical History Notes: CVA - 1992 Oncologic Medical History: Positive for: Received Chemotherapy - last dose 2006 Negative for: Received Radiation Psychiatric Medical History: Negative for: Anorexia/bulimia; Confinement Anxiety Immunizations Pneumococcal Vaccine: Received Pneumococcal Vaccination: Yes Implantable Devices Hospitalization / Surgery History KIERRE, HINTZ ([id number]) Name of Hospital Purpose of Hospitalization/Surgery Date [facility] bronchitis 11/03/2013 Family and Social History Cancer: Yes - Paternal Grandparents; Diabetes: No; Heart Disease: No; Hereditary Spherocytosis: No; Hypertension: No; Kidney Disease: Yes - Siblings; Lung  Disease: Yes - Mother; Seizures: No; Stroke: No; Thyroid Problems: No; Tuberculosis: No; Never smoker; Marital Status - Married; Alcohol Use: Never; Drug Use: No History; Caffeine Use: Never; Financial Concerns: No; Food, Clothing or Shelter Needs: No; Support System Lacking: No; Transportation Concerns: No; Advanced Directives: No; Patient does not want information on Advanced Directives Physician Affirmation I have reviewed and agree with the above information. Electronic Signature(s) Signed: 04/20/2017 4:27:13 PM By: Christin Fudge MD, FACS Signed: 04/20/2017 5:02:13 PM By: Montey Hora Entered By: Christin Fudge on 04/20/2017 11:54:11 Adam Merritt ([id number]) -------------------------------------------------------------------------------- SuperBill Details Patient Name: Adam Merritt Date of Service: 04/20/2017 Medical Record Number: [id number] Patient Account Number: [id number] Date of Birth/Sex: February 10, 1946 (71 y.o. 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 I89.0 Lymphedema, not elsewhere classified E66.01 Morbid (severe) obesity due to excess calories L97.221 Non-pressure chronic ulcer of left calf limited to breakdown of skin L89.623 Pressure ulcer of left heel, stage 3 Facility Procedures CPT4 Code: [id number] Description: 81191 - DEB SUBQ TISSUE 20 SQ CM/< ICD-10 Diagnosis Description 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 I89.0 Lymphedema, not elsewhere classified E66.01 Morbid (severe) obesity due to excess calories L89.623 Pressure ulcer  of left heel, stage 3 Modifier: Quantity: 1 Physician Procedures CPT4 Code: 4782956 Description: 99213 - WC PHYS LEVEL 3 - EST PT ICD-10 Diagnosis Description E11.620 Type  2 diabetes mellitus with diabetic dermatitis L89.623 Pressure ulcer of left heel, stage 3 I89.0 Lymphedema, not elsewhere classified Modifier: 25 Quantity: 1 CPT4 Code: 2130865 Description: 78469 - WC PHYS SUBQ TISS 20 SQ CM ICD-10 Diagnosis Description 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

## 2017-04-22 NOTE — Progress Notes (Signed)
Adam Merritt, Adam Merritt ([id number]) Visit Report for 04/20/2017 Arrival Information Details Patient Name: Adam Merritt Date of Service: 04/20/2017 11:00 AM Medical Record Number: [id number] Patient Account Number: [id number] Date of Birth/Sex: March 04, 1946 (71 y.o. Male) Treating RN: Montey Hora Primary Care Meyah Corle: [name] Other Clinician: Referring Lawrencia Mauney: [name] Treating Noemi Ishmael/Extender: Frann Rider in Treatment: 3 Visit Information History Since Last Visit Added or deleted any medications: No Patient Arrived: [name] Any new allergies or adverse reactions: No Arrival Time: 11:06 Had a fall or experienced change in No Accompanied By: spouse activities of daily living that may affect Transfer Assistance: None risk of falls: Patient Identification Verified: Yes Signs or symptoms of abuse/neglect since last visito No Secondary Verification Process Completed: Yes Hospitalized since last visit: No Patient Requires Transmission-Based Precautions: No Has Dressing in Place as Prescribed: Yes Patient Has Alerts: Yes Has Compression in Place as Prescribed: No Patient Alerts: DMII Pain Present Now: No Electronic Signature(s) Signed: 04/20/2017 5:02:13 PM By: Montey Hora Entered By: Montey Hora on 04/20/2017 11:09:28 Adam Merritt ([id number]) -------------------------------------------------------------------------------- Encounter Discharge Information Details Patient Name: Adam Merritt Date of Service: 04/20/2017 11:00 AM Medical Record Number: [id number] Patient Account Number: [id number] Date of Birth/Sex: 1946/05/09 (71 y.o. Male) Treating RN: Montey Hora Primary Care Brailey Buescher: [name] Other Clinician: Referring Maryon Kemnitz: [name] Treating Robina Hamor/Extender: Frann Rider in Treatment: 3 Encounter Discharge Information Items Discharge Pain Level: 0 Discharge Condition: Stable Ambulatory Status: Cane Discharge Destination:  Home Transportation: Private Auto Accompanied By: spouse Schedule Follow-up Appointment: Yes Medication Reconciliation completed and No provided to Patient/Care Lashaundra Lehrmann: Provided on Clinical Summary of Care: 04/20/2017 Form Type Recipient Paper Patient JT Electronic Signature(s) Signed: 04/20/2017 2:12:30 PM By: Montey Hora Entered By: Montey Hora on 04/20/2017 14:12:29 Adam Merritt ([id number]) -------------------------------------------------------------------------------- Lower Extremity Assessment Details Patient Name: Adam Merritt Date of Service: 04/20/2017 11:00 AM Medical Record Number: [id number] Patient Account Number: [id number] Date of Birth/Sex: 1945/12/07 (71 y.o. Male) Treating RN: Montey Hora Primary Care Shell Yandow: [name] Other Clinician: Referring Abdulkarim Eberlin: [name] Treating Stevey Stapleton/Extender: Frann Rider in Treatment: 3 Edema Assessment Assessed: [Left: No] [Right: No] Edema: [Left: Ye] [Right: s] Calf Left: Right: Point of Measurement: 34 cm From Medial Instep cm cm Ankle Left: Right: Point of Measurement: 12 cm From Medial Instep cm cm Vascular Assessment Pulses: Dorsalis Pedis Palpable: [Left:Yes] Posterior Tibial Palpable: [Left:Yes] Extremity colors, hair growth, and conditions: Extremity Color: [Left:Hyperpigmented] Hair Growth on Extremity: [Left:No] Temperature of Extremity: [Left:Warm] Capillary Refill: [Left:< 3 seconds] Electronic Signature(s) Signed: 04/20/2017 5:02:13 PM By: Montey Hora Entered By: Montey Hora on 04/20/2017 11:22:08 Adam Merritt ([id number]) -------------------------------------------------------------------------------- Multi Wound Chart Details Patient Name: Adam Merritt Date of Service: 04/20/2017 11:00 AM Medical Record Number: [id number] Patient Account Number: [id number] Date of Birth/Sex: [birth year] (71 y.o. Male) Treating RN: Montey Hora Primary Care Anibal Quinby:  [name] Other Clinician: Referring Madeline Pho: [name] Treating Joshuajames Moehring/Extender: Frann Rider in Treatment: 3 Vital Signs Height(in): 71 Pulse(bpm): 71 Weight(lbs): 298 Blood Pressure(mmHg): 146/76 Body Mass Index(BMI): 51 Temperature(F): 98.1 Respiratory Rate 18 (breaths/min): Photos: [8:No Photos] [N/A:N/A] Wound Location: [8:Left Calcaneus] [N/A:N/A] Wounding Event: [8:Pressure Injury] [N/A:N/A] Primary Etiology: [8:Pressure Ulcer] [N/A:N/A] Secondary Etiology: [8:Diabetic Wound/Ulcer of the Lower Extremity] [N/A:N/A] Comorbid History: [8:Anemia, Lymphedema, Chronic Obstructive Pulmonary Disease (COPD), Sleep Apnea, Congestive Heart Failure, Coronary Artery Disease, Hypertension, Peripheral Venous Disease, Type II Diabetes, Osteoarthritis, Neuropathy, Received  Chemotherapy] [N/A:N/A] Date Acquired: [8:04/16/2017] [N/A:N/A] Weeks of Treatment: [8:0] [N/A:N/A] Wound Status: [8:Open] [N/A:N/A] Measurements  L x W x D [8:2x3.6x0.1] [N/A:N/A] (cm) Area (cm) : [8:5.655] [N/A:N/A] Volume (cm) : [8:0.565] [N/A:N/A] Classification: [8:Category/Stage III] [N/A:N/A] Exudate Amount: [8:Large] [N/A:N/A] Exudate Type: [8:Serous] [N/A:N/A] Exudate Color: [8:amber] [N/A:N/A] Wound Margin: [8:Flat and Intact] [N/A:N/A] Granulation Amount: [8:Medium (34-66%)] [N/A:N/A] Granulation Quality: [8:Red] [N/A:N/A] Necrotic Amount: [8:Medium (34-66%)] [N/A:N/A] Necrotic Tissue: [8:Eschar, Adherent Slough] [N/A:N/A] Exposed Structures: [8:Fat Layer (Subcutaneous Tissue) Exposed: Yes Fascia: No Tendon: No Muscle: No] [N/A:N/A] Joint: No Bone: No Epithelialization: Small (1-33%) N/A N/A Debridement: Debridement ([id number]) N/A N/A Pre-procedure 11:35 N/A N/A Verification/Time Out Taken: Pain Control: Lidocaine 4% Topical Solution N/A N/A Tissue Debrided: Necrotic/Eschar, N/A N/A Fibrin/Slough, Skin, Subcutaneous Level: Skin/Subcutaneous Tissue N/A N/A Debridement Area  (sq cm): 7.2 N/A N/A Instrument: Forceps N/A N/A Bleeding: Minimum N/A N/A Hemostasis Achieved: Pressure N/A N/A Procedural Pain: 0 N/A N/A Post Procedural Pain: 0 N/A N/A Debridement Treatment Procedure was tolerated well N/A N/A Response: Post Debridement 2x3.6x0.2 N/A N/A Measurements L x W x D (cm) Post Debridement Volume: 1.131 N/A N/A (cm) Post Debridement Stage: Category/Stage III N/A N/A Periwound Skin Texture: Excoriation: No N/A N/A Induration: No Callus: No Crepitus: No Rash: No Scarring: No Periwound Skin Moisture: Maceration: No N/A N/A Dry/Scaly: No Periwound Skin Color: Atrophie Blanche: No N/A N/A Cyanosis: No Ecchymosis: No Erythema: No Hemosiderin Staining: No Mottled: No Pallor: No Rubor: No Temperature: No Abnormality N/A N/A Tenderness on Palpation: Yes N/A N/A Wound Preparation: Ulcer Cleansing: N/A N/A Rinsed/Irrigated with Saline Topical Anesthetic Applied: Other: lidocaine 4% Procedures Performed: Debridement N/A N/A Treatment Notes Electronic Signature(s) Signed: 04/20/2017 11:50:09 AM By: Christin Fudge MD, FACS Entered By: Christin Fudge on 04/20/2017 11:50:09 Adam Merritt ([id number]) Adam Merritt, Adam Merritt ([id number]) -------------------------------------------------------------------------------- [place] Details Patient Name: Adam Merritt Date of Service: 04/20/2017 11:00 AM Medical Record Number: [id number] Patient Account Number: [id number] Date of Birth/Sex: 20-May-1946 (71 y.o. Male) Treating RN: Montey Hora Primary Care Shonnie Poudrier: [name] Other Clinician: Referring Mikhayla Phillis: [name] Treating Rin Gorton/Extender: Frann Rider in Treatment: 3 Active Inactive Electronic Signature(s) Signed: 04/20/2017 2:11:17 PM By: Montey Hora Entered By: Montey Hora on 04/20/2017 14:11:16 Adam Merritt  ([id number]) -------------------------------------------------------------------------------- Pain Assessment Details Patient Name: Adam Merritt Date of Service: 04/20/2017 11:00 AM Medical Record Number: [id number] Patient Account Number: [id number] Date of Birth/Sex: [birth year] (71 y.o. Male) Treating RN: Montey Hora Primary Care Cheyla Duchemin: [name] Other Clinician: Referring Nailyn Dearinger: [name] Treating Maura Braaten/Extender: Frann Rider in Treatment: 3 Active Problems Location of Pain Severity and Description of Pain Patient Has Paino Yes Site Locations Pain Location: Pain in Ulcers With Dressing Change: Yes Duration of the Pain. Constant / Intermittento Constant Pain Management and Medication Current Pain Management: Electronic Signature(s) Signed: 04/20/2017 5:02:13 PM By: Montey Hora Entered By: Montey Hora on 04/20/2017 11:09:49 Adam Merritt ([id number]) -------------------------------------------------------------------------------- Patient/Caregiver Education Details Patient Name: Adam Merritt Date of Service: 04/20/2017 11:00 AM Medical Record Number: [id number] Patient Account Number: [id number] Date of Birth/Gender: Aug 29, 1945 (71 y.o. Male) Treating RN: Montey Hora Primary Care Physician: [name] Other Clinician: Referring Physician: [name] Treating Physician/Extender: Frann Rider in Treatment: 3 Education Assessment Education Provided To: Patient and Caregiver Education Topics Provided Wound/Skin Impairment: Handouts: Other: wound care and compression as ordered Methods: Demonstration, Explain/Verbal Responses: State content correctly Electronic Signature(s) Signed: 04/20/2017 5:02:13 PM By: Montey Hora Entered By: Montey Hora on 04/20/2017 14:13:24 Adam Merritt ([id number]) -------------------------------------------------------------------------------- Wound Assessment Details Patient Name: Adam Merritt Date of Service: 04/20/2017 11:00 AM Medical  Record Number: [id number] Patient Account Number: [id number] Date of Birth/Sex: Sep 30, 1945 (71 y.o. Male) Treating RN: Montey Hora Primary Care Fabien Travelstead: [name] Other Clinician: Referring Daveda Larock: [name] Treating Jolanda Mccann/Extender: Frann Rider in Treatment: 3 Wound Status Wound Number: 8 Primary Pressure Ulcer Etiology: Wound Location: Left Calcaneus Secondary Diabetic Wound/Ulcer of the Lower Extremity Wounding Event: Pressure Injury Etiology: Date Acquired: 04/16/2017 Wound Open Weeks Of Treatment: 0 Status: Clustered Wound: No Comorbid Anemia, Lymphedema, Chronic Obstructive History: Pulmonary Disease (COPD), Sleep Apnea, Congestive Heart Failure, Coronary Artery Disease, Hypertension, Peripheral Venous Disease, Type II Diabetes, Osteoarthritis, Neuropathy, Received Chemotherapy Photos Photo Uploaded By: Montey Hora on 04/20/2017 14:27:20 Wound Measurements Length: (cm) 2 Width: (cm) 3.6 Depth: (cm) 0.1 Area: (cm) 5.655 Volume: (cm) 0.565 % Reduction in Area: % Reduction in Volume: Epithelialization: Small (1-33%) Tunneling: No Undermining: No Wound Description Classification: Category/Stage III Wound Margin: Flat and Intact Exudate Amount: Large Exudate Type: Serous Exudate Color: amber Foul Odor After Cleansing: No Slough/Fibrino Yes Wound Bed Granulation Amount: Medium (34-66%) Exposed Structure Granulation Quality: Red Fascia Exposed: No Necrotic Amount: Medium (34-66%) Fat Layer (Subcutaneous Tissue) Exposed: Yes Necrotic Quality: Eschar, Adherent Slough Tendon Exposed: No Adam Merritt, Adam Merritt ([id number]) Muscle Exposed: No Joint Exposed: No Bone Exposed: No Periwound Skin Texture Texture Color No Abnormalities Noted: No No Abnormalities Noted: No Callus: No Atrophie Blanche: No Crepitus: No Cyanosis: No Excoriation: No Ecchymosis: No Induration: No Erythema:  No Rash: No Hemosiderin Staining: No Scarring: No Mottled: No Pallor: No Moisture Rubor: No No Abnormalities Noted: No Dry / Scaly: No Temperature / Pain Maceration: No Temperature: No Abnormality Tenderness on Palpation: Yes Wound Preparation Ulcer Cleansing: Rinsed/Irrigated with Saline Topical Anesthetic Applied: Other: lidocaine 4%, Treatment Notes Wound #8 (Left Calcaneus) 1. Cleansed with: Clean wound with Normal Saline 2. Anesthetic Topical Lidocaine 4% cream to wound bed prior to debridement 4. Dressing Applied: Santyl Ointment 5. Secondary Dressing Applied Dry Gauze Foam Kerlix/Conform 7. Secured with Tape Patient to wear own compression stockings Notes netting Electronic Signature(s) Signed: 04/20/2017 5:02:13 PM By: Montey Hora Entered By: Montey Hora on 04/20/2017 11:21:46 Adam Merritt ([id number]) -------------------------------------------------------------------------------- [place] Details Patient Name: Adam Merritt Date of Service: 04/20/2017 11:00 AM Medical Record Number: [id number] Patient Account Number: [id number] Date of Birth/Sex: Aug 06, 1945 (71 y.o. Male) Treating RN: Montey Hora Primary Care Radiance [name]: [name] Other Clinician: Referring Collins Kerby: [name] Treating Shevaun Lovan/Extender: Frann Rider in Treatment: 3 Vital Signs Time Taken: 11:10 Temperature (F): 98.1 Height (in): 64 Pulse (bpm): 59 Weight (lbs): 298 Respiratory Rate (breaths/min): 18 Body Mass Index (BMI): 51.1 Blood Pressure (mmHg): 146/76 Reference Range: 80 - 120 mg / dl Electronic Signature(s) Signed: 04/20/2017 5:02:13 PM By: Montey Hora Entered By: Montey Hora on 04/20/2017 11:11:15

## 2017-04-23 ENCOUNTER — Ambulatory Visit: Payer: Medicare Other | Admitting: Occupational Therapy

## 2017-04-24 ENCOUNTER — Encounter: Payer: Medicare Other | Admitting: Occupational Therapy

## 2017-04-30 ENCOUNTER — Encounter: Payer: Medicare Other | Admitting: Occupational Therapy

## 2017-05-03 ENCOUNTER — Encounter: Payer: Medicare Other | Admitting: Surgery

## 2017-05-03 ENCOUNTER — Encounter: Payer: Medicare Other | Admitting: Occupational Therapy

## 2017-05-03 DIAGNOSIS — E11620 Type 2 diabetes mellitus with diabetic dermatitis: Secondary | ICD-10-CM | POA: Diagnosis not present

## 2017-05-06 NOTE — Progress Notes (Signed)
Adam, Merritt ([id number]) Visit Report for 05/03/2017 Chief Complaint Document Details Patient Name: Adam Merritt, Adam Merritt Date of Service: 05/03/2017 3:15 PM Medical Record Number: [id number] Patient Account Number: [id number] Date of Birth/Sex: [birth year] (71 y.o. Male) Treating RN: Montey Hora Primary Care Provider: [name] Other Clinician: Referring Provider: [name] Treating Provider/Extender: Frann Rider in Treatment: 5 Information Obtained from: Patient Chief Complaint the patient was recently discharged and now is back for a pressure ulcer on his left heel which was caused about 2 weeks ago Electronic Signature(s) Signed: 05/03/2017 4:13:27 PM By: Christin Fudge MD, FACS Entered By: Christin Fudge on 05/03/2017 16:13:27 Adam Merritt ([id number]) -------------------------------------------------------------------------------- HPI Details Patient Name: Adam Merritt Date of Service: 05/03/2017 3:15 PM Medical Record Number: [id number] Patient Account Number: [id number] Date of Birth/Sex: [birth year] (71 y.o. Male) Treating RN: Montey Hora Primary Care Provider: [name] Other Clinician: Referring Provider: [name] Treating Provider/Extender: Frann Rider in Treatment: 5 History of Present Illness Location: left lower extremity lymphedema with a small open wound which is fairly superficial on his left anterior calf Quality: Patient reports No Pain. Severity: Patient states wound are getting better Duration: his happened only last week Context: The wound appeared gradually over time Modifying Factors: he is being treated at the lymphedema clinic with regular wrapping of his left lower extremity Associated Signs and Symptoms: Patient reports having difficulty standing for long periods. HPI Description: 71 year old gentleman with chronic bilateral lower extremity lymphedema associated with diabetes mellitus and obesity has been very compliant  with his management and has been going to the lymphedema clinic in the recent past. He now returns with a small superficial ulcer on the left anterior lower calf about a week's duration He has been under the treatment of the lymphedema clinic for the last month. 04/20/2017 -- the patient was discharged a few weeks ago for lymphedema with ulceration and is under the care of the lymphedema clinic. However he had a fall about 2 weeks ago where he lost consciousness and was laying on the floor for about an hour. During this time he has sustained a pressure injury to the left heel. Of note he has had a vascular consult by Dr. Annamarie Major -- on 09/29/2013 and Dr. Trula Slade opinion was:"I have reviewed his outside ultrasound which shows an ankle-brachial index of 0.72 on the left and 1.0 on the right.o This study was repeated at our office.o The ankle-brachial index on the right was 1.26 with triphasic waveforms.o On the left it was a 1.04 with biphasic waveforms.o The patient had a toe pressure of 121 on the right and 100 on the left. In summary, I would not recommend any further imaging , as I do not think his symptoms are related to arterial insufficiency, " ===== Old notes: 71 year old gentleman seen earlier at our clinic in June 2016 and again in June 2017 is noncompliant with recommendations and does not follow-up regularly. 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 He had referred him to the lymphedema clinic and he was accepted but he did not follow-up with them. At that stage he had agreed to get a repeat arterial study done and he has seen Dr. Trula Slade in [place]. This time around he has been seen by his PCP  Dr. Tiburcio Bash who referred him urgently to [place] for bilateral lower  extremity cellulitis in spite of being on antibiotics -- he was placed on Keflex and 500 mg 3 times a day for 10 days. His comorbidities include coronary artery disease, diabetes mellitus, hypertension, Bell's palsy and chronic lymphedema. he was also recently admitted to the hospital on April 13 and kept overnight with the diagnosis of a TIA and a left ICA stenosis to follow-up with vascular surgery as an outpatient 10/05/2016 -- patient has been compliant this week and left his compression and dressing on for the entire week and had home health change it appropriately. 11/13/2016 -- his insurance will not cover compression stockings for lymphedema and hence we will have to send him to the lymphedema clinic as soon as we have finished with his healing. 11/20/2016 -- his lymphedema clinic appointment is still pending but other than that he has done very well and we are ready to transition him over. 11/27/16 patient's wounds appeared to be completely healed on evaluation today. He still has bilateral left edema and has JERMAIN, CURT. ([id number]) been referred to the lymphedema clinic although we are still waiting to hear back from them on scheduling. Apparently she has to be discharged from home health services prior to being able to establish with the lymphedema clinic. At this point considering he is completely healed from the standpoint of wounds of the bilateral lower extremities I think it is appropriate to discharge him from home health and initiate weekly wraps on our end. There is no evidence of infection. 01/01/2017 -- the patient was recently discharged on 12/04/2016 and asked to follow-up in the lymphedema clinic. However they seem to be some issues with his going to the lymphedema clinic and then referring him to dermatology for an opinion regarding his skin. Ultimately the patient did not have a follow-up and has now redeveloped wheezing from the right lower extremity and ulceration  on the left lower extremity. The patient and his wife are rather helpless and do not seem to be very compliant with their care. 01/08/17 on evaluation today patient has just a single lower extremity wounds noted. Fortunately he is having no significant pain, no bowe or bladder dysfunction, no nausea and no vomiting. There's nothing to indicate a systemic worsening infection. 01/16/17 on a violation today patient appears to be doing fairly well in regard to his bilateral lower extremities. He has a very small ulcer which is barely openable left lower extremity and otherwise is completely closed in regard to both legs. I believe we may be ready to get him back to the lymphedema clinic at this point. ==== Old Notes: 71 year old gentleman who comes with bilateral lower limb edema for over 20 years and recent attack of blisters and redness for about 2 weeks. The patient says he has had bilateral lower limb edema since 1996. He has been noncompliant with wearing wraps in the past and even was given a boot to wear at night which she never tolerated and didn't wear it. He has a past medical history of sleep apnea, neuropathy with diabetes, hypertension, hyperlipidemia, coronary artery disease, varicose veins, carotid artery disease and is status post CABG and cholecystectomy. He was seen by the vascular surgeon on 09/29/2013 and Dr. Trula Slade opinion was:"I have reviewed his outside ultrasound which shows an ankle-brachial index of 0.72 on the left and 1.0 on the right.o This study was repeated at our office.o The ankle-brachial index on the right was  1.26 with triphasic waveforms.o On the left it was a 1.04 with biphasic waveforms.o The patient had a toe pressure of 121 on the right and 100 on the left. In summary, I would not recommend any further imaging , as I do not think his symptoms are related to arterial insufficiency, but rather secondary to diabetic neuropathy.o The patient still has room for  increasing his Neurontin dose, which he states does help him.o I have scheduled him to come back in one year for a repeat arterial evaluation." in June 2016, a year ago, he has a venous duplex study to be done. He was also going to be accepted to the lymphedema clinic but I did not believe the patient went regularly for this. ==== Electronic Signature(s) Signed: 05/03/2017 4:14:10 PM By: Christin Fudge MD, FACS Entered By: Christin Fudge on 05/03/2017 16:14:10 Adam Merritt ([id number]) -------------------------------------------------------------------------------- Physical Exam Details Patient Name: Adam Merritt Date of Service: 05/03/2017 3:15 PM Medical Record Number: [id number] Patient Account Number: [id number] Date of Birth/Sex: March 15, 1946 (71 y.o. Male) Treating RN: Montey Hora Primary Care Provider: [name] Other Clinician: Referring Provider: [name] Treating Provider/Extender: Frann Rider in Treatment: 5 Constitutional . Pulse regular. Respirations normal and unlabored. Afebrile. . Eyes Nonicteric. Reactive to light. Ears, Nose, Mouth, and Throat Lips, teeth, and gums WNL.Marland Kitchen Moist mucosa without lesions. Neck supple and nontender. No palpable supraclavicular or cervical adenopathy. Normal sized without goiter. Respiratory WNL. No retractions.. Cardiovascular Pedal Pulses WNL. No clubbing, cyanosis or edema. Lymphatic No adneopathy. No adenopathy. No adenopathy. Musculoskeletal Adexa without tenderness or enlargement.. Digits and nails w/o clubbing, cyanosis, infection, petechiae, ischemia, or inflammatory conditions.. Integumentary (Hair, Skin) No suspicious lesions. No crepitus or fluctuance. No peri-wound warmth or erythema. No masses.Marland Kitchen Psychiatric Judgement and insight Intact.. No evidence of depression, anxiety, or agitation.. Notes the wound on the left calcaneal area continues to be clean and no sharp debridement was required  today. Electronic Signature(s) Signed: 05/03/2017 4:14:47 PM By: Christin Fudge MD, FACS Entered By: Christin Fudge on 05/03/2017 16:14:46 Adam Merritt ([id number]) -------------------------------------------------------------------------------- Physician Orders Details Patient Name: Adam Merritt Date of Service: 05/03/2017 3:15 PM Medical Record Number: [id number] Patient Account Number: [id number] Date of Birth/Sex: 1945/10/17 (71 y.o. Male) Treating RN: Montey Hora Primary Care Provider: [name] Other Clinician: Referring Provider: [name] Treating Provider/Extender: Frann Rider in Treatment: 5 Verbal / Phone Orders: No Diagnosis Coding Wound Cleansing Wound #8 Left Calcaneus o Clean wound with Normal Saline. o May Shower, gently pat wound dry prior to applying new dressing. Anesthetic Wound #8 Left Calcaneus o Topical Lidocaine 4% cream applied to wound bed prior to debridement Primary Wound Dressing Wound #8 Left Calcaneus o Aquacel Ag Secondary Dressing Wound #8 Left Calcaneus o Dry Gauze o Conform/Kerlix o Foam Dressing Change Frequency Wound #8 Left Calcaneus o Change dressing every day. Follow-up Appointments Wound #8 Left Calcaneus o Return Appointment in 1 week. Edema Control Wound #8 Left Calcaneus o Patient to wear own compression stockings Additional Orders / Instructions Wound #8 Left Calcaneus o Increase protein intake. Electronic Signature(s) Signed: 05/03/2017 4:36:05 PM By: Christin Fudge MD, FACS Signed: 05/04/2017 4:46:33 PM By: Montey Hora Entered By: Montey Hora on 05/03/2017 15:38:36 AHYAN, KREEGER ([id number]) SHLOK, RAZ ([id number]) -------------------------------------------------------------------------------- Problem List Details Patient Name: AVYUKTH, BONTEMPO Date of Service: 05/03/2017 3:15 PM Medical Record Number: [id number] Patient Account Number: [id number] Date of Birth/Sex:  1946/06/02 (71 y.o. 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 03/26/2017 Yes I89.0 Lymphedema, not elsewhere classified 03/26/2017 Yes E66.01 Morbid (severe) obesity due to excess calories 03/26/2017 Yes L97.221 Non-pressure chronic ulcer of left calf limited to breakdown of skin 03/26/2017 Yes L89.623 Pressure ulcer of left heel, stage 3 04/20/2017 Yes Inactive Problems Resolved Problems Electronic Signature(s) Signed: 05/03/2017 4:13:17 PM By: Christin Fudge MD, FACS Entered By: Christin Fudge on 05/03/2017 16:13:16 Adam Merritt ([id number]) -------------------------------------------------------------------------------- Progress Note Details Patient Name: Adam Merritt Date of Service: 05/03/2017 3:15 PM Medical Record Number: [id number] Patient Account Number: [id number] Date of Birth/Sex: January 02, 1946 (71 y.o. Male) Treating RN: Montey Hora Primary Care Provider: [name] Other Clinician: Referring Provider: [name] Treating Provider/Extender: Frann Rider in Treatment: 5 Subjective Chief Complaint Information obtained from Patient the patient was recently discharged and now is back for a pressure ulcer on his left heel which was caused about 2 weeks ago History of Present Illness (HPI) The following HPI elements were documented for the patient's wound: Location: left lower extremity lymphedema with a small open wound which is fairly superficial on his left anterior calf Quality: Patient reports No Pain. Severity: Patient states wound are getting better Duration: his happened only last week Context: The wound appeared gradually over time Modifying Factors: he is being treated at the  lymphedema clinic with regular wrapping of his left lower extremity Associated Signs and Symptoms: Patient reports having difficulty standing for long periods. 71 year old gentleman with chronic bilateral lower extremity lymphedema associated with diabetes mellitus and obesity has been very compliant with his management and has been going to the lymphedema clinic in the recent past. He now returns with a small superficial ulcer on the left anterior lower calf about a week's duration He has been under the treatment of the lymphedema clinic for the last month. 04/20/2017 -- the patient was discharged a few weeks ago for lymphedema with ulceration and is under the care of the lymphedema clinic. However he had a fall about 2 weeks ago where he lost consciousness and was laying on the floor for about an hour. During this time he has sustained a pressure injury to the left heel. Of note he has had a vascular consult by Dr. Annamarie Major -- on 09/29/2013 and Dr. Trula Slade opinion was:"I have reviewed his outside ultrasound which shows an ankle-brachial index of 0.72 on the left and 1.0 on the right. This study was repeated at our office. The ankle-brachial index on the right was 1.26 with triphasic waveforms. On the left it was a 1.04 with biphasic waveforms. The patient had a toe pressure of 121 on the right and 100 on the left. In summary, I would not recommend any further imaging , as I do not think his symptoms are related to arterial insufficiency, " ===== Old notes: 71 year old gentleman seen earlier at our clinic in June 2016 and again in June 2017 is noncompliant with recommendations and does not follow-up regularly. 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 He had referred him to the  lymphedema clinic and he was accepted but he did not follow-up with them. At  that stage he had agreed to get a repeat arterial study done and he has seen Dr. Trula Slade in [place]. This time around he has been seen by his PCP Dr. Tiburcio Bash who referred him urgently to [place] for bilateral lower extremity cellulitis in spite of being on antibiotics -- he was placed on Keflex and 500 mg 3 times a day for 10 days. His comorbidities include coronary artery disease, diabetes mellitus, hypertension, Bell's palsy and chronic lymphedema. he was also recently admitted to the hospital on April 13 and kept overnight with the diagnosis of a TIA and a left ICA stenosis to follow-up with vascular surgery as an outpatient 10/05/2016 -- patient has been compliant this week and left his compression and dressing on for the entire week and had PHUC, KLUTTZ. ([id number]) home health change it appropriately. 11/13/2016 -- his insurance will not cover compression stockings for lymphedema and hence we will have to send him to the lymphedema clinic as soon as we have finished with his healing. 11/20/2016 -- his lymphedema clinic appointment is still pending but other than that he has done very well and we are ready to transition him over. 11/27/16 patient's wounds appeared to be completely healed on evaluation today. He still has bilateral left edema and has been referred to the lymphedema clinic although we are still waiting to hear back from them on scheduling. Apparently she has to be discharged from home health services prior to being able to establish with the lymphedema clinic. At this point considering he is completely healed from the standpoint of wounds of the bilateral lower extremities I think it is appropriate to discharge him from home health and initiate weekly wraps on our end. There is no evidence of infection. 01/01/2017 -- the patient was recently discharged on 12/04/2016 and asked to follow-up in the  lymphedema clinic. However they seem to be some issues with his going to the lymphedema clinic and then referring him to dermatology for an opinion regarding his skin. Ultimately the patient did not have a follow-up and has now redeveloped wheezing from the right lower extremity and ulceration on the left lower extremity. The patient and his wife are rather helpless and do not seem to be very compliant with their care. 01/08/17 on evaluation today patient has just a single lower extremity wounds noted. Fortunately he is having no significant pain, no bowe or bladder dysfunction, no nausea and no vomiting. There's nothing to indicate a systemic worsening infection. 01/16/17 on a violation today patient appears to be doing fairly well in regard to his bilateral lower extremities. He has a very small ulcer which is barely openable left lower extremity and otherwise is completely closed in regard to both legs. I believe we may be ready to get him back to the lymphedema clinic at this point. ==== Old Notes: 71 year old gentleman who comes with bilateral lower limb edema for over 20 years and recent attack of blisters and redness for about 2 weeks. The patient says he has had bilateral lower limb edema since 1996. He has been noncompliant with wearing wraps in the past and even was given a boot to wear at night which she never tolerated and didn't wear it. He has a past medical history of sleep apnea, neuropathy with diabetes, hypertension, hyperlipidemia, coronary artery disease, varicose veins, carotid artery disease and is status post CABG and cholecystectomy. He was seen by the vascular surgeon on 09/29/2013 and Dr. Trula Slade opinion was:"I have reviewed his outside ultrasound  which shows an ankle-brachial index of 0.72 on the left and 1.0 on the right. This study was repeated at our office. The ankle-brachial index on the right was 1.26 with triphasic waveforms. On the left it was a 1.04 with biphasic  waveforms. The patient had a toe pressure of 121 on the right and 100 on the left. In summary, I would not recommend any further imaging , as I do not think his symptoms are related to arterial insufficiency, but rather secondary to diabetic neuropathy. The patient still has room for increasing his Neurontin dose, which he states does help him. I have scheduled him to come back in one year for a repeat arterial evaluation." in June 2016, a year ago, he has a venous duplex study to be done. He was also going to be accepted to the lymphedema clinic but I did not believe the patient went regularly for this. ==== Patient History Information obtained from Patient. Family History Cancer - Paternal Grandparents, Kidney Disease - Siblings, Lung Disease - Mother, No family history of Diabetes, Heart Disease, Hereditary Spherocytosis, Hypertension, Seizures, Stroke, Thyroid Problems, Tuberculosis. Social History Never smoker, Marital Status - Married, Alcohol Use - Never, Drug Use - No History, Caffeine Use - Never. Medical History EMIEL, KIELTY ([id number]) Hospitalization/Surgery History - 11/03/2013, [facility], bronchitis. Medical And Surgical History Notes Cardiovascular varicose veins Neurologic CVA - 1992 Objective Constitutional Pulse regular. Respirations normal and unlabored. Afebrile. Vitals Time Taken: 3:19 PM, Height: 64 in, Weight: 298 lbs, BMI: 51.1, Temperature: 97.8 F, Pulse: 72 bpm, Respiratory Rate: 18 breaths/min, Blood Pressure: 163/97 mmHg. Eyes Nonicteric. Reactive to light. Ears, Nose, Mouth, and Throat Lips, teeth, and gums WNL.Marland Kitchen Moist mucosa without lesions. Neck supple and nontender. No palpable supraclavicular or cervical adenopathy. Normal sized without goiter. Respiratory WNL. No retractions.. Cardiovascular Pedal Pulses WNL. No clubbing, cyanosis or edema. Lymphatic No adneopathy. No adenopathy. No adenopathy. Musculoskeletal Adexa without tenderness or  enlargement.. Digits and nails w/o clubbing, cyanosis, infection, petechiae, ischemia, or inflammatory conditions.Marland Kitchen Psychiatric Judgement and insight Intact.. No evidence of depression, anxiety, or agitation.. General Notes: the wound on the left calcaneal area continues to be clean and no sharp debridement was required today. Integumentary (Hair, Skin) No suspicious lesions. No crepitus or fluctuance. No peri-wound warmth or erythema. No masses.JAKEEL, STARLIPER ([id number]) Wound #8 status is Open. Original cause of wound was Pressure Injury. The wound is located on the Left Calcaneus. The wound measures 1.2cm length x 2.1cm width x 0.1cm depth; 1.979cm^2 area and 0.198cm^3 volume. There is Fat Layer (Subcutaneous Tissue) Exposed exposed. There is no tunneling or undermining noted. There is a large amount of serous drainage noted. The wound margin is flat and intact. There is large (67-100%) red granulation within the wound bed. There is a small (1-33%) amount of necrotic tissue within the wound bed including Eschar and Adherent Slough. The periwound skin appearance did not exhibit: Callus, Crepitus, Excoriation, Induration, Rash, Scarring, Dry/Scaly, Maceration, Atrophie Blanche, Cyanosis, Ecchymosis, Hemosiderin Staining, Mottled, Pallor, Rubor, Erythema. Periwound temperature was noted as No Abnormality. The periwound has tenderness on palpation. 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 May Shower, gently pat wound dry prior  to applying new dressing. Anesthetic: Wound #8 Left Calcaneus: Topical Lidocaine 4% cream applied to wound  bed prior to debridement Primary Wound Dressing: Wound #8 Left Calcaneus: Aquacel Ag Secondary Dressing: Wound #8 Left Calcaneus: Dry Gauze Conform/Kerlix Foam Dressing Change Frequency: Wound #8 Left Calcaneus: Change dressing every day. Follow-up Appointments: Wound #8 Left Calcaneus: Return Appointment in 1 week. Edema Control: Wound #8 Left Calcaneus: Patient to wear own compression stockings Additional Orders / Instructions: Wound #8 Left Calcaneus: Increase protein intake. INRI, SOBIESKI. ([id number]) the wound did not require any debridement today and after review today I have recommended: 1. Silver alginate and a heel cup to be applied and changed every other day 2. Offloading has been discussed with him in great detail 3. Appropriate treatment for now and if the wound does not heal within the reasonable amount of time I believe he would benefit from a repeat arterial duplex study 4. Regular visits to the wound center. Electronic Signature(s) Signed: 05/03/2017 4:15:29 PM By: Christin Fudge MD, FACS Entered By: Christin Fudge on 05/03/2017 16:15:29 Adam Merritt ([id number]) -------------------------------------------------------------------------------- ROS/PFSH Details Patient Name: Adam Merritt Date of Service: 05/03/2017 3:15 PM Medical Record Number: [id number] Patient Account Number: [id number] Date of Birth/Sex: [birth year] (71 y.o. Male) Treating RN: Montey Hora Primary Care Provider: [name] Other Clinician: Referring Provider: [name] Treating Provider/Extender: Frann Rider in Treatment: 5 Information Obtained From Patient Wound History Do you currently have one or more open woundso Yes How many open wounds do you currently haveo 1 Approximately how long have you had your woundso 10 days How have you been treating your wound(s) until nowo dry dressing Has your wound(s) ever healed and then re-openedo No Have you had any lab work  done in the past montho No Have you tested positive for an antibiotic resistant organism (MRSA, VRE)o No Have you tested positive for osteomyelitis (bone infection)o No Have you had any tests for circulation on your legso Yes Where was the test doneo [facility] Eyes Medical History: Negative for: Cataracts; Glaucoma; Optic Neuritis Ear/Nose/Mouth/Throat Medical History: Negative for: Chronic sinus problems/congestion; Middle ear problems Hematologic/Lymphatic Medical History: Positive for: Anemia - aplastic anemia; Lymphedema Negative for: Hemophilia; Human Immunodeficiency Virus; Sickle Cell Disease Respiratory Medical History: Positive for: Chronic Obstructive Pulmonary Disease (COPD); Sleep Apnea Negative for: Aspiration; Asthma; Pneumothorax; Tuberculosis Cardiovascular Medical History: Positive for: Congestive Heart Failure; Coronary Artery Disease; Hypertension; Peripheral Venous Disease Negative for: Angina; Arrhythmia; Deep Vein Thrombosis; Hypotension; Myocardial Infarction; Peripheral Arterial Disease; Phlebitis; Vasculitis Past Medical History Notes: varicose veins Gastrointestinal Medical History: Negative for: Cirrhosis ; Colitis; Crohnos; Hepatitis A; Hepatitis B; Hepatitis CARMEL, WADDINGTON ([id number]) Endocrine Medical History: Positive for: Type II Diabetes Negative for: Type I Diabetes Time with diabetes: since 2006 Treated with: Insulin Blood sugar tested every day: Yes Tested : Blood sugar testing results: Breakfast: 209 Genitourinary Medical History: Negative for: End Stage Renal Disease Immunological Medical History: Negative for: Lupus Erythematosus; Raynaudos; Scleroderma Integumentary (Skin) Medical History: Negative for: History of Burn; History of pressure wounds Musculoskeletal Medical History: Positive for: Osteoarthritis Negative for: Gout; Rheumatoid Arthritis; Osteomyelitis Neurologic Medical History: Positive for: Neuropathy Negative  for: Dementia; Quadriplegia; Paraplegia; Seizure Disorder Past Medical History Notes: CVA - 1992 Oncologic Medical History: Positive for: Received Chemotherapy - last dose 2006 Negative for: Received Radiation Psychiatric Medical History: Negative for: Anorexia/bulimia; Confinement Anxiety Immunizations Pneumococcal Vaccine: Received Pneumococcal Vaccination: Yes Implantable Devices Hospitalization / Surgery History JOREY, DOLLARD ([id number]) Name of [place]  of Hospitalization/Surgery Date [facility] bronchitis 11/03/2013 Family and Social History Cancer: Yes - Paternal Grandparents; Diabetes: No; Heart Disease: No; Hereditary Spherocytosis: No; Hypertension: No; Kidney Disease: Yes - Siblings; Lung Disease: Yes - Mother; Seizures: No; Stroke: No; Thyroid Problems: No; Tuberculosis: No; Never smoker; Marital Status - Married; Alcohol Use: Never; Drug Use: No History; Caffeine Use: Never; Financial Concerns: No; Food, Clothing or Shelter Needs: No; Support System Lacking: No; Transportation Concerns: No; Advanced Directives: No; Patient does not want information on Advanced Directives Physician Affirmation I have reviewed and agree with the above information. Electronic Signature(s) Signed: 05/03/2017 4:36:05 PM By: Christin Fudge MD, FACS Signed: 05/04/2017 4:46:33 PM By: Montey Hora Entered By: Christin Fudge on 05/03/2017 16:14:18 Adam Merritt ([id number]) -------------------------------------------------------------------------------- [place] Details Patient Name: Adam Merritt Date of Service: 05/03/2017 Medical Record Number: [id number] Patient Account Number: [id number] Date of Birth/Sex: 1945-10-20 (71 y.o. 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, stage 3 Facility Procedures CPT4 Code: 78295621 Description: 30865 - WOUND CARE VISIT-LEV 3 EST PT Modifier: Quantity: 1 Physician Procedures CPT4 Code: 7846962 Description: 95284 - WC PHYS LEVEL 3 - EST PT ICD-10 Diagnosis Description 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

## 2017-05-06 NOTE — Progress Notes (Signed)
Adam Merritt ([id number]) Visit Report for 05/03/2017 Arrival Information Details Patient Name: Adam, Merritt Date of Service: 05/03/2017 3:15 PM Medical Record Number: [id number] Patient Account Number: [id number] Date of Birth/Sex: 10-16-1945 (71 y.o. Male) Treating RN: Montey Hora Primary Care Berthel Bagnall: [name] Other Clinician: Referring Laporsha Grealish: [name] Treating Dewaun Kinzler/Extender: Frann Rider in Treatment: 5 Visit Information History Since Last Visit Added or deleted any medications: No Patient Arrived: Adam Merritt Any new allergies or adverse reactions: No Arrival Time: 15:18 Had a fall or experienced change in No Accompanied By: spouse activities of daily living that may affect Transfer Assistance: None risk of falls: Patient Identification Verified: Yes Signs or symptoms of abuse/neglect since last visito No Secondary Verification Process Completed: Yes Hospitalized since last visit: No Patient Requires Transmission-Based Precautions: No Has Dressing in Place as Prescribed: Yes Patient Has Alerts: Yes Pain Present Now: No Patient Alerts: DMII Electronic Signature(s) Signed: 05/04/2017 4:46:33 PM By: Montey Hora Entered By: Montey Hora on 05/03/2017 15:18:24 Adam Merritt ([id number]) -------------------------------------------------------------------------------- Clinic Level of Care Assessment Details Patient Name: Adam Merritt Date of Service: 05/03/2017 3:15 PM Medical Record Number: [id number] Patient Account Number: [id number] Date of Birth/Sex: Aug 22, 1945 (71 y.o. Male) Treating RN: Montey Hora Primary Care Navraj Dreibelbis: [name] Other Clinician: Referring Lyanna Blystone: [name] Treating Lamyia Cdebaca/Extender: Frann Rider in Treatment: 5 Clinic Level of Care Assessment Items TOOL 4 Quantity Score []  - Use when only an EandM is performed on FOLLOW-UP visit 0 ASSESSMENTS - Nursing Assessment / Reassessment X - Reassessment of  Co-morbidities (includes updates in patient status) 1 10 X- 1 5 Reassessment of Adherence to Treatment Plan ASSESSMENTS - Wound and Skin Assessment / Reassessment X - Simple Wound Assessment / Reassessment - one wound 1 5 []  - 0 Complex Wound Assessment / Reassessment - multiple wounds []  - 0 Dermatologic / Skin Assessment (not related to wound area) ASSESSMENTS - Focused Assessment []  - Circumferential Edema Measurements - multi extremities 0 []  - 0 Nutritional Assessment / Counseling / Intervention X- 1 5 Lower Extremity Assessment (monofilament, tuning fork, pulses) []  - 0 Peripheral Arterial Disease Assessment (using hand held doppler) ASSESSMENTS - Ostomy and/or Continence Assessment and Care []  - Incontinence Assessment and Management 0 []  - 0 Ostomy Care Assessment and Management (repouching, etc.) PROCESS - Coordination of Care X - Simple Patient / Family Education for ongoing care 1 15 []  - 0 Complex (extensive) Patient / Family Education for ongoing care []  - 0 Staff obtains Programmer, systems, Records, Test Results / Process Orders []  - 0 Staff telephones HHA, Nursing Homes / Clarify orders / etc []  - 0 Routine Transfer to another Facility (non-emergent condition) []  - 0 Routine Hospital Admission (non-emergent condition) []  - 0 New Admissions / Biomedical engineer / Ordering NPWT, Apligraf, etc. []  - 0 Emergency Hospital Admission (emergent condition) X- 1 10 Simple Discharge Coordination Adam Merritt, Adam Merritt ([id number]) []  - 0 Complex (extensive) Discharge Coordination PROCESS - Special Needs []  - Pediatric / Minor Patient Management 0 []  - 0 Isolation Patient Management []  - 0 Hearing / Language / Visual special needs []  - 0 Assessment of Community assistance (transportation, D/C planning, etc.) []  - 0 Additional assistance / Altered mentation []  - 0 Support Surface(s) Assessment (bed, cushion, seat, etc.) INTERVENTIONS - Wound Cleansing / Measurement X -  Simple Wound Cleansing - one wound 1 5 []  - 0 Complex Wound Cleansing - multiple wounds X- 1 5 Wound Imaging (photographs - any number of wounds) []  - 0  Wound Tracing (instead of photographs) X- 1 5 Simple Wound Measurement - one wound []  - 0 Complex Wound Measurement - multiple wounds INTERVENTIONS - Wound Dressings []  - Small Wound Dressing one or multiple wounds 0 X- 1 15 Medium Wound Dressing one or multiple wounds []  - 0 Large Wound Dressing one or multiple wounds []  - 0 Application of Medications - topical []  - 0 Application of Medications - injection INTERVENTIONS - Miscellaneous []  - External ear exam 0 []  - 0 Specimen Collection (cultures, biopsies, blood, body fluids, etc.) []  - 0 Specimen(s) / Culture(s) sent or taken to Lab for analysis []  - 0 Patient Transfer (multiple staff / Civil Service fast streamer / Similar devices) []  - 0 Simple Staple / Suture removal (25 or less) []  - 0 Complex Staple / Suture removal (26 or more) []  - 0 Hypo / Hyperglycemic Management (close monitor of Blood Glucose) []  - 0 Ankle / Brachial Index (ABI) - do not check if billed separately X- 1 5 Vital Signs Adam Merritt, Adam Merritt ([id number]) Has the patient been seen at the hospital within the last three years: Yes Total Score: 85 Level Of Care: New/Established - Level 3 Electronic Signature(s) Signed: 05/04/2017 4:46:33 PM By: Montey Hora Entered By: Montey Hora on 05/03/2017 16:26:32 Adam Merritt ([id number]) -------------------------------------------------------------------------------- Encounter Discharge Information Details Patient Name: Adam Merritt Date of Service: 05/03/2017 3:15 PM Medical Record Number: [id number] Patient Account Number: [id number] Date of Birth/Sex: September 16, 1945 (71 y.o. Male) Treating RN: Montey Hora Primary Care Shawon Denzer: [name] Other Clinician: Referring Hollie Bartus: [name] Treating Neida Ellegood/Extender: Frann Rider in Treatment:  5 Encounter Discharge Information Items Discharge Pain Level: 0 Discharge Condition: Stable Ambulatory Status: Cane Discharge Destination: Home Private Transportation: Auto Accompanied By: spouse Schedule Follow-up Appointment: Yes Medication Reconciliation completed and provided No to Patient/Care Yidel Teuscher: Clinical Summary of Care: Electronic Signature(s) Signed: 05/03/2017 4:27:41 PM By: Montey Hora Entered By: Montey Hora on 05/03/2017 16:27:41 Adam Merritt ([id number]) -------------------------------------------------------------------------------- Lower Extremity Assessment Details Patient Name: Adam Merritt Date of Service: 05/03/2017 3:15 PM Medical Record Number: [id number] Patient Account Number: [id number] Date of Birth/Sex: Aug 29, 1945 (71 y.o. Male) Treating RN: Montey Hora Primary Care Jamarie Mussa: [name] Other Clinician: Referring Presten Joost: [name] Treating Kaisy Severino/Extender: Frann Rider in Treatment: 5 Vascular Assessment Pulses: Dorsalis Pedis Palpable: [Left:Yes] Posterior Tibial Extremity colors, hair growth, and conditions: Extremity Color: [Left:Hyperpigmented] Hair Growth on Extremity: [Left:No] Temperature of Extremity: [Left:Warm] Capillary Refill: [Left:< 3 seconds] Toe Nail Assessment Left: Right: Thick: Yes Discolored: Yes Deformed: No Improper Length and Hygiene: No Electronic Signature(s) Signed: 05/04/2017 4:46:33 PM By: Montey Hora Entered By: Montey Hora on 05/03/2017 15:29:31 Adam Merritt ([id number]) -------------------------------------------------------------------------------- Multi Wound Chart Details Patient Name: Adam Merritt Date of Service: 05/03/2017 3:15 PM Medical Record Number: [id number] Patient Account Number: [id number] Date of Birth/Sex: 08/26/1945 (71 y.o. Male) Treating RN: Montey Hora Primary Care Garrick Midgley: [name] Other Clinician: Referring Koya Hunger: [name] Treating Aseem Sessums/Extender: Frann Rider in Treatment: 5 Vital Signs Height(in): 64 Pulse(bpm): 72 Weight(lbs): 298 Blood Pressure(mmHg): 163/97 Body Mass Index(BMI): 51 Temperature(F): 97.8 Respiratory Rate 18 (breaths/min): Photos: [8:No Photos] [N/A:N/A] Wound Location: [8:Left Calcaneus] [N/A:N/A] Wounding Event: [8:Pressure Injury] [N/A:N/A] Primary Etiology: [8:Pressure Ulcer] [N/A:N/A] Secondary Etiology: [8:Diabetic Wound/Ulcer of the Lower Extremity] [N/A:N/A] Comorbid History: [8:Anemia, Lymphedema, Chronic Obstructive Pulmonary Disease (COPD), Sleep Apnea, Congestive Heart Failure, Coronary Artery Disease, Hypertension, Peripheral Venous Disease, Type II Diabetes, Osteoarthritis, Neuropathy, Received  Chemotherapy] [N/A:N/A] Date Acquired: [8:04/16/2017] [N/A:N/A] Weeks of Treatment: [8:1] [  N/A:N/A] Wound Status: [8:Open] [N/A:N/A] Measurements L x W x D [8:1.2x2.1x0.1] [N/A:N/A] (cm) Area (cm) : [8:1.979] [N/A:N/A] Volume (cm) : [8:0.198] [N/A:N/A] % Reduction in Area: [8:65.00%] [N/A:N/A] % Reduction in Volume: [8:65.00%] [N/A:N/A] Classification: [8:Category/Stage III] [N/A:N/A] Exudate Amount: [8:Large] [N/A:N/A] Exudate Type: [8:Serous] [N/A:N/A] Exudate Color: [8:amber] [N/A:N/A] Wound Margin: [8:Flat and Intact] [N/A:N/A] Granulation Amount: [8:Large (67-100%)] [N/A:N/A] Granulation Quality: [8:Red] [N/A:N/A] Necrotic Amount: [8:Small (1-33%)] [N/A:N/A] Necrotic Tissue: [8:Eschar, Adherent Slough] [N/A:N/A] Exposed Structures: [8:Fat Layer (Subcutaneous Tissue) Exposed: Yes Fascia: No] [N/A:N/A] Tendon: No Muscle: No Joint: No Bone: No Epithelialization: Small (1-33%) N/A N/A Periwound Skin Texture: Excoriation: No N/A N/A Induration: No Callus: No Crepitus: No Rash: No Scarring: No Periwound Skin Moisture: Maceration: No N/A N/A Dry/Scaly: No Periwound Skin Color: Atrophie Blanche: No N/A N/A Cyanosis: No Ecchymosis:  No Erythema: No Hemosiderin Staining: No Mottled: No Pallor: No Rubor: No Temperature: No Abnormality N/A N/A Tenderness on Palpation: Yes N/A N/A Wound Preparation: Ulcer Cleansing: N/A N/A Rinsed/Irrigated with Saline Topical Anesthetic Applied: Other: lidocaine 4% Treatment Notes Electronic Signature(s) Signed: 05/03/2017 4:13:20 PM By: Adam Fudge MD, FACS Entered By: Adam Merritt on 05/03/2017 16:13:20 Adam Merritt ([id number]) -------------------------------------------------------------------------------- [place] Details Patient Name: Adam Merritt Date of Service: 05/03/2017 3:15 PM Medical Record Number: [id number] Patient Account Number: [id number] Date of Birth/Sex: September 18, 1945 (71 y.o. Male) Treating RN: Montey Hora Primary Care Sarajane Fambrough: [name] Other Clinician: Referring Jahzara Slattery: [name] Treating Kord Monette/Extender: Frann Rider in Treatment: 5 Active Inactive Electronic Signature(s) Signed: 05/04/2017 4:46:33 PM By: Montey Hora Entered By: Montey Hora on 05/03/2017 15:37:36 Adam Merritt ([id number]) -------------------------------------------------------------------------------- Pain Assessment Details Patient Name: Adam Merritt Date of Service: 05/03/2017 3:15 PM Medical Record Number: [id number] Patient Account Number: [id number] Date of Birth/Sex: 03-Jun-1946 (71 y.o. Male) Treating RN: Montey Hora Primary Care Mayar Whittier: [name] Other Clinician: Referring Ruhaan Nordahl: [name] Treating Ramari Bray/Extender: Frann Rider in Treatment: 5 Active Problems Location of Pain Severity and Description of Pain Patient Has Paino No Site Locations Pain Management and Medication Current Pain Management: Notes Topical or injectable lidocaine is offered to patient for acute pain when surgical debridement is performed. If needed, Patient is instructed to use over the counter pain medication for the  following 24-48 hours after debridement. Wound care MDs do not prescribed pain medications. Patient has chronic pain or uncontrolled pain. Patient has been instructed to make an appointment with their Primary Care Physician for pain management. Electronic Signature(s) Signed: 05/04/2017 4:46:33 PM By: Montey Hora Entered By: Montey Hora on 05/03/2017 15:19:14 Adam Merritt ([id number]) -------------------------------------------------------------------------------- Patient/Caregiver Education Details Patient Name: Adam Merritt Date of Service: 05/03/2017 3:15 PM Medical Record Number: [id number] Patient Account Number: [id number] Date of Birth/Gender: 02/09/1946 (71 y.o. Male) Treating RN: Montey Hora Primary Care Physician: [name] Other Clinician: Referring Physician: [name] Treating Physician/Extender: Frann Rider in Treatment: 5 Education Assessment Education Provided To: Patient and Caregiver Education Topics Provided Wound/Skin Impairment: Handouts: Other: wound care as ordered Methods: Demonstration, Explain/Verbal Responses: State content correctly Electronic Signature(s) Signed: 05/04/2017 4:46:33 PM By: Montey Hora Entered By: Montey Hora on 05/03/2017 16:28:00 Adam Merritt ([id number]) -------------------------------------------------------------------------------- Wound Assessment Details Patient Name: Adam Merritt Date of Service: 05/03/2017 3:15 PM Medical Record Number: [id number] Patient Account Number: [id number] Date of Birth/Sex: 10/05/1945 (71 y.o. Male) Treating RN: Montey Hora Primary Care Bardia Wangerin: [name] Other Clinician: Referring Tymere Depuy: [name] Treating Aaran Enberg/Extender: Frann Rider in Treatment: 5 Wound Status Wound Number: 8 Primary  Pressure Ulcer Etiology: Wound Location: Left Calcaneus Secondary Diabetic Wound/Ulcer of the Lower Extremity Wounding Event: Pressure  Injury Etiology: Date Acquired: 04/16/2017 Wound Open Weeks Of Treatment: 1 Status: Clustered Wound: No Comorbid Anemia, Lymphedema, Chronic Obstructive History: Pulmonary Disease (COPD), Sleep Apnea, Congestive Heart Failure, Coronary Artery Disease, Hypertension, Peripheral Venous Disease, Type II Diabetes, Osteoarthritis, Neuropathy, Received Chemotherapy Photos Photo Uploaded By: Gretta Cool, BSN, RN, CWS, Kim on 05/04/2017 12:55:57 Wound Measurements Length: (cm) 1.2 Width: (cm) 2.1 Depth: (cm) 0.1 Area: (cm) 1.979 Volume: (cm) 0.198 % Reduction in Area: 65% % Reduction in Volume: 65% Epithelialization: Small (1-33%) Tunneling: No Undermining: No Wound Description Classification: Category/Stage III Foul Odor Wound Margin: Flat and Intact Slough/Fi Exudate Amount: Large Exudate Type: Serous Exudate Color: amber After Cleansing: No brino Yes Wound Bed Granulation Amount: Large (67-100%) Exposed Structure Granulation Quality: Red Fascia Exposed: No Necrotic Amount: Small (1-33%) Fat Layer (Subcutaneous Tissue) Exposed: Yes Necrotic Quality: Eschar, Adherent Slough Tendon Exposed: No MASSAI, Adam Merritt ([id number]) Muscle Exposed: No Joint Exposed: No Bone Exposed: No Periwound Skin Texture Texture Color No Abnormalities Noted: No No Abnormalities Noted: No Callus: No Atrophie Blanche: No Crepitus: No Cyanosis: No Excoriation: No Ecchymosis: No Induration: No Erythema: No Rash: No Hemosiderin Staining: No Scarring: No Mottled: No Pallor: No Moisture Rubor: No No Abnormalities Noted: No Dry / Scaly: No Temperature / Pain Maceration: No Temperature: No Abnormality Tenderness on Palpation: Yes Wound Preparation Ulcer Cleansing: Rinsed/Irrigated with Saline Topical Anesthetic Applied: Other: lidocaine 4%, Treatment Notes Wound #8 (Left Calcaneus) 1. Cleansed with: Clean wound with Normal Saline 2. Anesthetic Topical Lidocaine 4% cream to wound bed  prior to debridement 4. Dressing Applied: Other dressing (specify in notes) 5. Secondary Dressing Applied Dry Gauze Foam Kerlix/Conform Notes silvercell, netting Electronic Signature(s) Signed: 05/04/2017 4:46:33 PM By: Montey Hora Entered By: Montey Hora on 05/03/2017 15:28:55 Adam Merritt ([id number]) -------------------------------------------------------------------------------- [place] Details Patient Name: Adam Merritt Date of Service: 05/03/2017 3:15 PM Medical Record Number: [id number] Patient Account Number: [id number] Date of Birth/Sex: 05-01-1946 (71 y.o. Male) Treating RN: Montey Hora Primary Care Shakeeta Godette: [name] Other Clinician: Referring Ambar Raphael: [name] Treating Gwendalyn Mcgonagle/Extender: Frann Rider in Treatment: 5 Vital Signs Time Taken: 15:19 Temperature (F): 97.8 Height (in): 64 Pulse (bpm): 72 Weight (lbs): 298 Respiratory Rate (breaths/min): 18 Body Mass Index (BMI): 51.1 Blood Pressure (mmHg): 163/97 Reference Range: 80 - 120 mg / dl Electronic Signature(s) Signed: 05/04/2017 4:46:33 PM By: Montey Hora Entered By: Montey Hora on 05/03/2017 15:21:30

## 2017-05-07 ENCOUNTER — Encounter: Payer: Medicare Other | Admitting: Occupational Therapy

## 2017-05-08 ENCOUNTER — Encounter: Payer: Medicare Other | Admitting: Occupational Therapy

## 2017-05-10 ENCOUNTER — Encounter: Payer: Medicare Other | Admitting: Occupational Therapy

## 2017-05-10 ENCOUNTER — Encounter: Payer: Medicare Other | Attending: Surgery | Admitting: Surgery

## 2017-05-10 DIAGNOSIS — M1990 Unspecified osteoarthritis, unspecified site: Secondary | ICD-10-CM | POA: Insufficient documentation

## 2017-05-10 DIAGNOSIS — I890 Lymphedema, not elsewhere classified: Secondary | ICD-10-CM | POA: Insufficient documentation

## 2017-05-10 DIAGNOSIS — Z794 Long term (current) use of insulin: Secondary | ICD-10-CM | POA: Diagnosis not present

## 2017-05-10 DIAGNOSIS — Z8673 Personal history of transient ischemic attack (TIA), and cerebral infarction without residual deficits: Secondary | ICD-10-CM | POA: Diagnosis not present

## 2017-05-10 DIAGNOSIS — Z9221 Personal history of antineoplastic chemotherapy: Secondary | ICD-10-CM | POA: Diagnosis not present

## 2017-05-10 DIAGNOSIS — I6522 Occlusion and stenosis of left carotid artery: Secondary | ICD-10-CM | POA: Insufficient documentation

## 2017-05-10 DIAGNOSIS — E11620 Type 2 diabetes mellitus with diabetic dermatitis: Secondary | ICD-10-CM | POA: Diagnosis not present

## 2017-05-10 DIAGNOSIS — I509 Heart failure, unspecified: Secondary | ICD-10-CM | POA: Insufficient documentation

## 2017-05-10 DIAGNOSIS — G4730 Sleep apnea, unspecified: Secondary | ICD-10-CM | POA: Diagnosis not present

## 2017-05-10 DIAGNOSIS — E785 Hyperlipidemia, unspecified: Secondary | ICD-10-CM | POA: Insufficient documentation

## 2017-05-10 DIAGNOSIS — Z6841 Body Mass Index (BMI) 40.0 and over, adult: Secondary | ICD-10-CM | POA: Diagnosis not present

## 2017-05-10 DIAGNOSIS — L89623 Pressure ulcer of left heel, stage 3: Secondary | ICD-10-CM | POA: Insufficient documentation

## 2017-05-10 DIAGNOSIS — I110 Hypertensive heart disease with heart failure: Secondary | ICD-10-CM | POA: Insufficient documentation

## 2017-05-10 DIAGNOSIS — Z951 Presence of aortocoronary bypass graft: Secondary | ICD-10-CM | POA: Diagnosis not present

## 2017-05-10 DIAGNOSIS — L97221 Non-pressure chronic ulcer of left calf limited to breakdown of skin: Secondary | ICD-10-CM | POA: Insufficient documentation

## 2017-05-10 DIAGNOSIS — I2510 Atherosclerotic heart disease of native coronary artery without angina pectoris: Secondary | ICD-10-CM | POA: Diagnosis not present

## 2017-05-10 DIAGNOSIS — E1140 Type 2 diabetes mellitus with diabetic neuropathy, unspecified: Secondary | ICD-10-CM | POA: Diagnosis not present

## 2017-05-12 NOTE — Progress Notes (Signed)
OLUWADAMILARE, TOBLER ([id number]) Visit Report for 05/10/2017 Chief Complaint Document Details Patient Name: Adam Merritt, Adam Merritt Date of Service: 05/10/2017 3:15 PM Medical Record Number: [id number] Patient Account Number: [id number] Date of Birth/Sex: 11-01-1945 (71 y.o. Male) Treating RN: Montey Hora Primary Care Provider: [name] Other Clinician: Referring Provider: [name] Treating Provider/Extender: Frann Rider in Treatment: 6 Information Obtained from: Patient Chief Complaint the patient was recently discharged and now is back for a pressure ulcer on his left heel which was caused about 2 weeks ago Electronic Signature(s) Signed: 05/10/2017 3:48:52 PM By: Christin Fudge MD, FACS Entered By: Christin Fudge on 05/10/2017 15:48:52 Adam Merritt ([id number]) -------------------------------------------------------------------------------- HPI Details Patient Name: Adam Merritt Date of Service: 05/10/2017 3:15 PM Medical Record Number: [id number] Patient Account Number: [id number] Date of Birth/Sex: 12-04-1945 (71 y.o. Male) Treating RN: Montey Hora Primary Care Provider: [name] Other Clinician: Referring Provider: [name] Treating Provider/Extender: Frann Rider in Treatment: 6 History of Present Illness Location: left lower extremity lymphedema with a small open wound which is fairly superficial on his left anterior calf Quality: Patient reports No Pain. Severity: Patient states wound are getting better Duration: his happened only last week Context: The wound appeared gradually over time Modifying Factors: he is being treated at the lymphedema clinic with regular wrapping of his left lower extremity Associated Signs and Symptoms: Patient reports having difficulty standing for long periods. HPI Description: 71 year old gentleman with chronic bilateral lower extremity lymphedema associated with diabetes mellitus and obesity has been very compliant with  his management and has been going to the lymphedema clinic in the recent past. He now returns with a small superficial ulcer on the left anterior lower calf about a week's duration He has been under the treatment of the lymphedema clinic for the last month. 04/20/2017 -- the patient was discharged a few weeks ago for lymphedema with ulceration and is under the care of the lymphedema clinic. However he had a fall about 2 weeks ago where he lost consciousness and was laying on the floor for about an hour. During this time he has sustained a pressure injury to the left heel. Of note he has had a vascular consult by Dr. Annamarie Major -- on 09/29/2013 and Dr. Trula Slade opinion was:"I have reviewed his outside ultrasound which shows an ankle-brachial index of 0.72 on the left and 1.0 on the right.o This study was repeated at our office.o The ankle-brachial index on the right was 1.26 with triphasic waveforms.o On the left it was a 1.04 with biphasic waveforms.o The patient had a toe pressure of 121 on the right and 100 on the left. In summary, I would not recommend any further imaging , as I do not think his symptoms are related to arterial insufficiency, " ===== Old notes: 71 year old gentleman seen earlier at our clinic in June 2016 and again in June 2017 is noncompliant with recommendations and does not follow-up regularly. 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 He had referred him to the lymphedema clinic and he was accepted but he did not follow-up with them. At that stage he had agreed to get a repeat arterial study done and he has seen Dr. Trula Slade in [place]. This time around he has been seen by his PCP  Dr. Tiburcio Bash who referred him urgently to [place] for bilateral lower  extremity cellulitis in spite of being on antibiotics -- he was placed on Keflex and 500 mg 3 times a day for 10 days. His comorbidities include coronary artery disease, diabetes mellitus, hypertension, Bell's palsy and chronic lymphedema. he was also recently admitted to the hospital on April 13 and kept overnight with the diagnosis of a TIA and a left ICA stenosis to follow-up with vascular surgery as an outpatient 10/05/2016 -- patient has been compliant this week and left his compression and dressing on for the entire week and had home health change it appropriately. 11/13/2016 -- his insurance will not cover compression stockings for lymphedema and hence we will have to send him to the lymphedema clinic as soon as we have finished with his healing. 11/20/2016 -- his lymphedema clinic appointment is still pending but other than that he has done very well and we are ready to transition him over. 11/27/16 patient's wounds appeared to be completely healed on evaluation today. He still has bilateral left edema and has Adam Merritt, Adam Merritt. ([id number]) been referred to the lymphedema clinic although we are still waiting to hear back from them on scheduling. Apparently she has to be discharged from home health services prior to being able to establish with the lymphedema clinic. At this point considering he is completely healed from the standpoint of wounds of the bilateral lower extremities I think it is appropriate to discharge him from home health and initiate weekly wraps on our end. There is no evidence of infection. 01/01/2017 -- the patient was recently discharged on 12/04/2016 and asked to follow-up in the lymphedema clinic. However they seem to be some issues with his going to the lymphedema clinic and then referring him to dermatology for an opinion regarding his skin. Ultimately the patient did not have a follow-up and has now redeveloped wheezing from the right lower extremity and ulceration  on the left lower extremity. The patient and his wife are rather helpless and do not seem to be very compliant with their care. 01/08/17 on evaluation today patient has just a single lower extremity wounds noted. Fortunately he is having no significant pain, no bowe or bladder dysfunction, no nausea and no vomiting. There's nothing to indicate a systemic worsening infection. 01/16/17 on a violation today patient appears to be doing fairly well in regard to his bilateral lower extremities. He has a very small ulcer which is barely openable left lower extremity and otherwise is completely closed in regard to both legs. I believe we may be ready to get him back to the lymphedema clinic at this point. ==== Old Notes: 71 year old gentleman who comes with bilateral lower limb edema for over 20 years and recent attack of blisters and redness for about 2 weeks. The patient says he has had bilateral lower limb edema since 1996. He has been noncompliant with wearing wraps in the past and even was given a boot to wear at night which she never tolerated and didn't wear it. He has a past medical history of sleep apnea, neuropathy with diabetes, hypertension, hyperlipidemia, coronary artery disease, varicose veins, carotid artery disease and is status post CABG and cholecystectomy. He was seen by the vascular surgeon on 09/29/2013 and Dr. Trula Slade opinion was:"I have reviewed his outside ultrasound which shows an ankle-brachial index of 0.72 on the left and 1.0 on the right.o This study was repeated at our office.o The ankle-brachial index on the right was  1.26 with triphasic waveforms.o On the left it was a 1.04 with biphasic waveforms.o The patient had a toe pressure of 121 on the right and 100 on the left. In summary, I would not recommend any further imaging , as I do not think his symptoms are related to arterial insufficiency, but rather secondary to diabetic neuropathy.o The patient still has room for  increasing his Neurontin dose, which he states does help him.o I have scheduled him to come back in one year for a repeat arterial evaluation." in June 2016, a year ago, he has a venous duplex study to be done. He was also going to be accepted to the lymphedema clinic but I did not believe the patient went regularly for this. ==== Electronic Signature(s) Signed: 05/10/2017 3:49:03 PM By: Christin Fudge MD, FACS Entered By: Christin Fudge on 05/10/2017 15:49:03 Adam Merritt ([id number]) -------------------------------------------------------------------------------- Physical Exam Details Patient Name: Adam Merritt Date of Service: 05/10/2017 3:15 PM Medical Record Number: [id number] Patient Account Number: [id number] Date of Birth/Sex: April 02, 1946 (71 y.o. Male) Treating RN: Montey Hora Primary Care Provider: [name] Other Clinician: Referring Provider: [name] Treating Provider/Extender: Frann Rider in Treatment: 6 Constitutional . Pulse regular. Respirations normal and unlabored. Afebrile. . Eyes Nonicteric. Reactive to light. Ears, Nose, Mouth, and Throat Lips, teeth, and gums WNL.Marland Kitchen Moist mucosa without lesions. Neck supple and nontender. No palpable supraclavicular or cervical adenopathy. Normal sized without goiter. Respiratory WNL. No retractions.. Cardiovascular Pedal Pulses WNL. No clubbing, cyanosis or edema. Lymphatic No adneopathy. No adenopathy. No adenopathy. Musculoskeletal Adexa without tenderness or enlargement.. Digits and nails w/o clubbing, cyanosis, infection, petechiae, ischemia, or inflammatory conditions.. Integumentary (Hair, Skin) No suspicious lesions. No crepitus or fluctuance. No peri-wound warmth or erythema. No masses.Marland Kitchen Psychiatric Judgement and insight Intact.. No evidence of depression, anxiety, or agitation.. Notes the wound on the left heel continues to be fairly clean and no sharp debridement was required today. We will  continue local care with silver alginate. Electronic Signature(s) Signed: 05/10/2017 3:49:43 PM By: Christin Fudge MD, FACS Entered By: Christin Fudge on 05/10/2017 15:49:43 Adam Merritt ([id number]) -------------------------------------------------------------------------------- Physician Orders Details Patient Name: Adam Merritt Date of Service: 05/10/2017 3:15 PM Medical Record Number: [id number] Patient Account Number: [id number] Date of Birth/Sex: [birth year] (71 y.o. Male) Treating RN: Montey Hora Primary Care Provider: [name] Other Clinician: Referring Provider: [name] Treating Provider/Extender: Frann Rider in Treatment: 6 Verbal / Phone Orders: Yes Clinician: Montey Hora Read Back and Verified: Yes Diagnosis Coding Wound Cleansing Wound #8 Left Calcaneus o Clean wound with Normal Saline. o May Shower, gently pat wound dry prior to applying new dressing. Anesthetic Wound #8 Left Calcaneus o Topical Lidocaine 4% cream applied to wound bed prior to debridement Primary Wound Dressing Wound #8 Left Calcaneus o Aquacel Ag Secondary Dressing Wound #8 Left Calcaneus o Dry Gauze o Conform/Kerlix o Foam - Allevyn Heel Cup Dressing Change Frequency Wound #8 Left Calcaneus o Change dressing every day. Follow-up Appointments Wound #8 Left Calcaneus o Return Appointment in 1 week. Edema Control Wound #8 Left Calcaneus o Patient to wear own compression stockings Additional Orders / Instructions Wound #8 Left Calcaneus o Increase protein intake. Electronic Signature(s) Signed: 05/10/2017 4:19:51 PM By: Christin Fudge MD, FACS Signed: 05/10/2017 4:53:14 PM By: Montey Hora Entered By: Montey Hora on 05/10/2017 15:44:23 Adam Merritt, Adam Merritt ([id number]) Adam Merritt, Adam Merritt ([id number]) -------------------------------------------------------------------------------- Problem List Details Patient Name: Adam Merritt, Adam Merritt. Date of Service:  05/10/2017 3:15 PM Medical Record Number:  [id number] Patient Account Number: [id number] Date of Birth/Sex: [birth year] (71 y.o. 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 03/26/2017 Yes I89.0 Lymphedema, not elsewhere classified 03/26/2017 Yes E66.01 Morbid (severe) obesity due to excess calories 03/26/2017 Yes L97.221 Non-pressure chronic ulcer of left calf limited to breakdown of skin 03/26/2017 Yes L89.623 Pressure ulcer of left heel, stage 3 04/20/2017 Yes Inactive Problems Resolved Problems Electronic Signature(s) Signed: 05/10/2017 3:48:38 PM By: Christin Fudge MD, FACS Entered By: Christin Fudge on 05/10/2017 15:48:37 Adam Merritt ([id number]) -------------------------------------------------------------------------------- Progress Note Details Patient Name: Adam Merritt Date of Service: 05/10/2017 3:15 PM Medical Record Number: [id number] Patient Account Number: [id number] Date of Birth/Sex: [birth year] (71 y.o. Male) Treating RN: Montey Hora Primary Care Provider: [name] Other Clinician: Referring Provider: [name] Treating Provider/Extender: Frann Rider in Treatment: 6 Subjective Chief Complaint Information obtained from Patient the patient was recently discharged and now is back for a pressure ulcer on his left heel which was caused about 2 weeks ago History of Present Illness (HPI) The following HPI elements were documented for the patient's wound: Location: left lower extremity lymphedema with a small open wound which is fairly superficial on his left anterior calf Quality: Patient reports No Pain. Severity: Patient states wound are getting better Duration: his happened only last  week Context: The wound appeared gradually over time Modifying Factors: he is being treated at the lymphedema clinic with regular wrapping of his left lower extremity Associated Signs and Symptoms: Patient reports having difficulty standing for long periods. 71 year old gentleman with chronic bilateral lower extremity lymphedema associated with diabetes mellitus and obesity has been very compliant with his management and has been going to the lymphedema clinic in the recent past. He now returns with a small superficial ulcer on the left anterior lower calf about a week's duration He has been under the treatment of the lymphedema clinic for the last month. 04/20/2017 -- the patient was discharged a few weeks ago for lymphedema with ulceration and is under the care of the lymphedema clinic. However he had a fall about 2 weeks ago where he lost consciousness and was laying on the floor for about an hour. During this time he has sustained a pressure injury to the left heel. Of note he has had a vascular consult by Dr. Annamarie Major -- on 09/29/2013 and Dr. Trula Slade opinion was:"I have reviewed his outside ultrasound which shows an ankle-brachial index of 0.72 on the left and 1.0 on the right. This study was repeated at our office. The ankle-brachial index on the right was 1.26 with triphasic waveforms. On the left it was a 1.04 with biphasic waveforms. The patient had a toe pressure of 121 on the right and 100 on the left. In summary, I would not recommend any further imaging , as I do not think his symptoms are related to arterial insufficiency, " ===== Old notes: 71 year old gentleman seen earlier at our clinic in June 2016 and again in June 2017 is noncompliant with recommendations and does not follow-up regularly. 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  He had referred him to the lymphedema clinic and he was accepted but he did not follow-up with them. At that stage he had agreed to get a repeat arterial study done and he has seen Dr. Trula Slade in [place]. This time around he has been seen by his PCP Dr. Tiburcio Bash who referred him urgently to [place] for bilateral lower extremity cellulitis in spite of being on antibiotics -- he was placed on Keflex and 500 mg 3 times a day for 10 days. His comorbidities include coronary artery disease, diabetes mellitus, hypertension, Bell's palsy and chronic lymphedema. he was also recently admitted to the hospital on April 13 and kept overnight with the diagnosis of a TIA and a left ICA stenosis to follow-up with vascular surgery as an outpatient 10/05/2016 -- patient has been compliant this week and left his compression and dressing on for the entire week and had Adam Merritt, Adam Merritt. ([id number]) home health change it appropriately. 11/13/2016 -- his insurance will not cover compression stockings for lymphedema and hence we will have to send him to the lymphedema clinic as soon as we have finished with his healing. 11/20/2016 -- his lymphedema clinic appointment is still pending but other than that he has done very well and we are ready to transition him over. 11/27/16 patient's wounds appeared to be completely healed on evaluation today. He still has bilateral left edema and has been referred to the lymphedema clinic although we are still waiting to hear back from them on scheduling. Apparently she has to be discharged from home health services prior to being able to establish with the lymphedema clinic. At this point considering he is completely healed from the standpoint of wounds of the bilateral lower extremities I think it is appropriate to discharge him from home health and initiate weekly wraps on our end. There is no evidence of  infection. 01/01/2017 -- the patient was recently discharged on 12/04/2016 and asked to follow-up in the lymphedema clinic. However they seem to be some issues with his going to the lymphedema clinic and then referring him to dermatology for an opinion regarding his skin. Ultimately the patient did not have a follow-up and has now redeveloped wheezing from the right lower extremity and ulceration on the left lower extremity. The patient and his wife are rather helpless and do not seem to be very compliant with their care. 01/08/17 on evaluation today patient has just a single lower extremity wounds noted. Fortunately he is having no significant pain, no bowe or bladder dysfunction, no nausea and no vomiting. There's nothing to indicate a systemic worsening infection. 01/16/17 on a violation today patient appears to be doing fairly well in regard to his bilateral lower extremities. He has a very small ulcer which is barely openable left lower extremity and otherwise is completely closed in regard to both legs. I believe we may be ready to get him back to the lymphedema clinic at this point. ==== Old Notes: 71 year old gentleman who comes with bilateral lower limb edema for over 20 years and recent attack of blisters and redness for about 2 weeks. The patient says he has had bilateral lower limb edema since 1996. He has been noncompliant with wearing wraps in the past and even was given a boot to wear at night which she never tolerated and didn't wear it. He has a past medical history of sleep apnea, neuropathy with diabetes, hypertension, hyperlipidemia, coronary artery disease, varicose veins, carotid artery disease and is status post CABG and  cholecystectomy. He was seen by the vascular surgeon on 09/29/2013 and Dr. Trula Slade opinion was:"I have reviewed his outside ultrasound which shows an ankle-brachial index of 0.72 on the left and 1.0 on the right. This study was repeated at our office.  The ankle-brachial index on the right was 1.26 with triphasic waveforms. On the left it was a 1.04 with biphasic waveforms. The patient had a toe pressure of 121 on the right and 100 on the left. In summary, I would not recommend any further imaging , as I do not think his symptoms are related to arterial insufficiency, but rather secondary to diabetic neuropathy. The patient still has room for increasing his Neurontin dose, which he states does help him. I have scheduled him to come back in one year for a repeat arterial evaluation." in June 2016, a year ago, he has a venous duplex study to be done. He was also going to be accepted to the lymphedema clinic but I did not believe the patient went regularly for this. ==== Patient History Information obtained from Patient. Family History Cancer - Paternal Grandparents, Kidney Disease - Siblings, Lung Disease - Mother, No family history of Diabetes, Heart Disease, Hereditary Spherocytosis, Hypertension, Seizures, Stroke, Thyroid Problems, Tuberculosis. Social History Never smoker, Marital Status - Married, Alcohol Use - Never, Drug Use - No History, Caffeine Use - Never. Medical History ANIRUDDH, CIAVARELLA ([id number]) Hospitalization/Surgery History - 11/03/2013, [facility], bronchitis. Medical And Surgical History Notes Cardiovascular varicose veins Neurologic CVA - 1992 Objective Constitutional Pulse regular. Respirations normal and unlabored. Afebrile. Vitals Time Taken: 3:17 PM, Height: 64 in, Weight: 298 lbs, BMI: 51.1, Temperature: 97.9 F, Pulse: 75 bpm, Respiratory Rate: 20 breaths/min, Blood Pressure: 149/85 mmHg. Eyes Nonicteric. Reactive to light. Ears, Nose, Mouth, and Throat Lips, teeth, and gums WNL.Marland Kitchen Moist mucosa without lesions. Neck supple and nontender. No palpable supraclavicular or cervical adenopathy. Normal sized without goiter. Respiratory WNL. No retractions.. Cardiovascular Pedal Pulses WNL. No clubbing, cyanosis or  edema. Lymphatic No adneopathy. No adenopathy. No adenopathy. Musculoskeletal Adexa without tenderness or enlargement.. Digits and nails w/o clubbing, cyanosis, infection, petechiae, ischemia, or inflammatory conditions.Marland Kitchen Psychiatric Judgement and insight Intact.. No evidence of depression, anxiety, or agitation.. General Notes: the wound on the left heel continues to be fairly clean and no sharp debridement was required today. We will continue local care with silver alginate. Integumentary (Hair, Skin) No suspicious lesions. No crepitus or fluctuance. No peri-wound warmth or erythema. No masses.Adam Merritt, Adam Merritt ([id number]) Wound #8 status is Open. Original cause of wound was Pressure Injury. The wound is located on the Left Calcaneus. The wound measures 1.1cm length x 1.9cm width x 0.1cm depth; 1.641cm^2 area and 0.164cm^3 volume. There is Fat Layer (Subcutaneous Tissue) Exposed exposed. There is no tunneling or undermining noted. There is a large amount of serous drainage noted. The wound margin is flat and intact. There is large (67-100%) red granulation within the wound bed. There is a small (1-33%) amount of necrotic tissue within the wound bed including Eschar and Adherent Slough. The periwound skin appearance did not exhibit: Callus, Crepitus, Excoriation, Induration, Rash, Scarring, Dry/Scaly, Maceration, Atrophie Blanche, Cyanosis, Ecchymosis, Hemosiderin Staining, Mottled, Pallor, Rubor, Erythema. Periwound temperature was noted as No Abnormality. The periwound has tenderness on palpation. 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 May Shower, gently pat wound dry prior to applying new dressing. Anesthetic: Wound #8 Left Calcaneus: Topical Lidocaine 4% cream applied to wound bed prior to debridement Primary Wound Dressing: Wound #8 Left Calcaneus: Aquacel Ag Secondary Dressing: Wound #8 Left Calcaneus: Dry Gauze Conform/Kerlix Foam - Allevyn Heel Cup Dressing Change Frequency: Wound #8 Left Calcaneus: Change dressing every day. Follow-up Appointments: Wound #8 Left Calcaneus: Return Appointment in 1 week. Edema Control: Wound #8 Left Calcaneus: Patient to wear own compression stockings Additional Orders / Instructions: Wound #8 Left Calcaneus: Increase protein intake. YSIDRO, RAMSAY. ([id number]) the wound did not require any debridement today and after review today I have recommended: 1. Silver alginate and a heel cup to be applied and changed every other day 2. Offloading has been discussed with him in great detail 3. Appropriate treatment for now and if the wound does not heal within the reasonable amount of time I believe he would benefit from a repeat arterial duplex study 4. Regular visits to the wound center. Electronic Signature(s) Signed: 05/10/2017 3:50:18 PM By: Christin Fudge MD, FACS Entered By: Christin Fudge on 05/10/2017 15:50:18 Adam Merritt ([id number]) -------------------------------------------------------------------------------- ROS/PFSH Details Patient Name: Adam Merritt Date of Service: 05/10/2017 3:15 PM Medical Record Number: [id number] Patient Account Number: [id number] Date of Birth/Sex: October 02, 1945 (71 y.o. Male) Treating RN: Montey Hora Primary Care Provider: [name] Other Clinician: Referring Provider: [name] Treating Provider/Extender: Frann Rider in Treatment: 6 Information Obtained From Patient Wound History Do you currently have one or more open woundso Yes How many open wounds do you currently haveo 1 Approximately how long have you  had your woundso 10 days How have you been treating your wound(s) until nowo dry dressing Has your wound(s) ever healed and then re-openedo No Have you had any lab work done in the past montho No Have you tested positive for an antibiotic resistant organism (MRSA, VRE)o No Have you tested positive for osteomyelitis (bone infection)o No Have you had any tests for circulation on your legso Yes Where was the test doneo [facility] Eyes Medical History: Negative for: Cataracts; Glaucoma; Optic Neuritis Ear/Nose/Mouth/Throat Medical History: Negative for: Chronic sinus problems/congestion; Middle ear problems Hematologic/Lymphatic Medical History: Positive for: Anemia - aplastic anemia; Lymphedema Negative for: Hemophilia; Human Immunodeficiency Virus; Sickle Cell Disease Respiratory Medical History: Positive for: Chronic Obstructive Pulmonary Disease (COPD); Sleep Apnea Negative for: Aspiration; Asthma; Pneumothorax; Tuberculosis Cardiovascular Medical History: Positive for: Congestive Heart Failure; Coronary Artery Disease; Hypertension; Peripheral Venous Disease Negative for: Angina; Arrhythmia; Deep Vein Thrombosis; Hypotension; Myocardial Infarction; Peripheral Arterial Disease; Phlebitis; Vasculitis Past Medical History Notes: varicose veins Gastrointestinal Medical History: Negative for: Cirrhosis ; Colitis; Crohnos; Hepatitis A; Hepatitis B; Hepatitis THADIUS, SMISEK ([id number]) Endocrine Medical History: Positive for: Type II Diabetes Negative for: Type I Diabetes Time with diabetes: since 2006 Treated with: Insulin Blood sugar tested every day: Yes Tested : Blood sugar testing results: Breakfast: 209 Genitourinary Medical History: Negative for: End Stage Renal Disease Immunological Medical History: Negative for: Lupus Erythematosus; Raynaudos; Scleroderma Integumentary (Skin) Medical History: Negative for: History of Burn; History of pressure  wounds Musculoskeletal Medical History: Positive for: Osteoarthritis Negative for: Gout; Rheumatoid Arthritis; Osteomyelitis Neurologic Medical History: Positive for: Neuropathy Negative for: Dementia; Quadriplegia; Paraplegia; Seizure Disorder Past Medical History Notes: CVA - 1992 Oncologic Medical History: Positive for: Received Chemotherapy - last dose 2006 Negative  for: Received Radiation Psychiatric Medical History: Negative for: Anorexia/bulimia; Confinement Anxiety Immunizations Pneumococcal Vaccine: Received Pneumococcal Vaccination: Yes Implantable Devices Hospitalization / Surgery History NICKOLIS, DIEL ([id number]) Name of Hospital Purpose of Hospitalization/Surgery Date [facility] bronchitis 11/03/2013 Family and Social History Cancer: Yes - Paternal Grandparents; Diabetes: No; Heart Disease: No; Hereditary Spherocytosis: No; Hypertension: No; Kidney Disease: Yes - Siblings; Lung Disease: Yes - Mother; Seizures: No; Stroke: No; Thyroid Problems: No; Tuberculosis: No; Never smoker; Marital Status - Married; Alcohol Use: Never; Drug Use: No History; Caffeine Use: Never; Financial Concerns: No; Food, Clothing or Shelter Needs: No; Support System Lacking: No; Transportation Concerns: No; Advanced Directives: No; Patient does not want information on Advanced Directives Physician Affirmation I have reviewed and agree with the above information. Electronic Signature(s) Signed: 05/10/2017 4:19:51 PM By: Christin Fudge MD, FACS Signed: 05/10/2017 4:53:14 PM By: Montey Hora Entered By: Christin Fudge on 05/10/2017 15:49:11 Adam Merritt ([id number]) -------------------------------------------------------------------------------- SuperBill Details Patient Name: Adam Merritt Date of Service: 05/10/2017 Medical Record Number: [id number] Patient Account Number: [id number] Date of Birth/Sex: May 11, 1946 (71 y.o. 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, stage 3 Facility Procedures CPT4 Code: 89211941 Description: 74081 - WOUND CARE VISIT-LEV 3 EST PT Modifier: Quantity: 1 Physician Procedures CPT4 Code: 4481856 Description: 31497 - WC PHYS LEVEL 3 - EST PT ICD-10 Diagnosis Description 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

## 2017-05-14 ENCOUNTER — Encounter: Payer: Medicare Other | Admitting: Occupational Therapy

## 2017-05-17 ENCOUNTER — Encounter: Payer: Medicare Other | Admitting: Occupational Therapy

## 2017-05-18 ENCOUNTER — Ambulatory Visit: Payer: Medicare Other | Admitting: Surgery

## 2017-05-18 NOTE — Progress Notes (Signed)
TRITON, HEIDRICH ([id number]) Visit Report for 05/10/2017 Arrival Information Details Patient Name: Adam Merritt, Adam Merritt Date of Service: 05/10/2017 3:15 PM Medical Record Number: [id number] Patient Account Number: [id number] Date of Birth/Sex: 26-Dec-1945 (71 y.o. Male) Treating RN: Montey Hora Primary Care Daci Stubbe: [name] Other Clinician: Referring Dontrey Snellgrove: [name] Treating Wadsworth Skolnick/Extender: Frann Rider in Treatment: 6 Visit Information History Since Last Visit Added or deleted any medications: No Patient Arrived: Adam Merritt Any new allergies or adverse reactions: No Arrival Time: 15:10 Had a fall or experienced change in No Accompanied By: spouse activities of daily living that may affect Transfer Assistance: None risk of falls: Patient Identification Verified: Yes Signs or symptoms of abuse/neglect since last visito No Secondary Verification Process Completed: Yes Hospitalized since last visit: No Patient Requires Transmission-Based Precautions: No Has Dressing in Place as Prescribed: Yes Patient Has Alerts: Yes Pain Present Now: No Patient Alerts: DMII Electronic Signature(s) Signed: 05/10/2017 4:53:14 PM By: Montey Hora Entered By: Montey Hora on 05/10/2017 15:10:47 Adam Merritt ([id number]) -------------------------------------------------------------------------------- Clinic Level of Care Assessment Details Patient Name: Adam Merritt Date of Service: 05/10/2017 3:15 PM Medical Record Number: [id number] Patient Account Number: [id number] Date of Birth/Sex: December 20, 1945 (71 y.o. Male) Treating RN: Montey Hora Primary Care Cynethia Schindler: [name] Other Clinician: Referring Jakhia Buxton: [name] Treating Prajwal Fellner/Extender: Frann Rider in Treatment: 6 Clinic Level of Care Assessment Items TOOL 4 Quantity Score []  - Use when only an EandM is performed on FOLLOW-UP visit 0 ASSESSMENTS - Nursing Assessment / Reassessment X - Reassessment of  Co-morbidities (includes updates in patient status) 1 10 X- 1 5 Reassessment of Adherence to Treatment Plan ASSESSMENTS - Wound and Skin Assessment / Reassessment X - Simple Wound Assessment / Reassessment - one wound 1 5 []  - 0 Complex Wound Assessment / Reassessment - multiple wounds []  - 0 Dermatologic / Skin Assessment (not related to wound area) ASSESSMENTS - Focused Assessment []  - Circumferential Edema Measurements - multi extremities 0 []  - 0 Nutritional Assessment / Counseling / Intervention X- 1 5 Lower Extremity Assessment (monofilament, tuning fork, pulses) []  - 0 Peripheral Arterial Disease Assessment (using hand held doppler) ASSESSMENTS - Ostomy and/or Continence Assessment and Care []  - Incontinence Assessment and Management 0 []  - 0 Ostomy Care Assessment and Management (repouching, etc.) PROCESS - Coordination of Care X - Simple Patient / Family Education for ongoing care 1 15 []  - 0 Complex (extensive) Patient / Family Education for ongoing care []  - 0 Staff obtains Programmer, systems, Records, Test Results / Process Orders []  - 0 Staff telephones HHA, Nursing Homes / Clarify orders / etc []  - 0 Routine Transfer to another Facility (non-emergent condition) []  - 0 Routine Hospital Admission (non-emergent condition) []  - 0 New Admissions / Biomedical engineer / Ordering NPWT, Apligraf, etc. []  - 0 Emergency Hospital Admission (emergent condition) X- 1 10 Simple Discharge Coordination Adam Merritt, Adam Merritt ([id number]) []  - 0 Complex (extensive) Discharge Coordination PROCESS - Special Needs []  - Pediatric / Minor Patient Management 0 []  - 0 Isolation Patient Management []  - 0 Hearing / Language / Visual special needs []  - 0 Assessment of Community assistance (transportation, D/C planning, etc.) []  - 0 Additional assistance / Altered mentation []  - 0 Support Surface(s) Assessment (bed, cushion, seat, etc.) INTERVENTIONS - Wound Cleansing / Measurement X -  Simple Wound Cleansing - one wound 1 5 []  - 0 Complex Wound Cleansing - multiple wounds X- 1 5 Wound Imaging (photographs - any number of wounds) []  - 0  Wound Tracing (instead of photographs) X- 1 5 Simple Wound Measurement - one wound []  - 0 Complex Wound Measurement - multiple wounds INTERVENTIONS - Wound Dressings X - Small Wound Dressing one or multiple wounds 1 10 []  - 0 Medium Wound Dressing one or multiple wounds []  - 0 Large Wound Dressing one or multiple wounds []  - 0 Application of Medications - topical []  - 0 Application of Medications - injection INTERVENTIONS - Miscellaneous []  - External ear exam 0 []  - 0 Specimen Collection (cultures, biopsies, blood, body fluids, etc.) []  - 0 Specimen(s) / Culture(s) sent or taken to Lab for analysis []  - 0 Patient Transfer (multiple staff / Civil Service fast streamer / Similar devices) []  - 0 Simple Staple / Suture removal (25 or less) []  - 0 Complex Staple / Suture removal (26 or more) []  - 0 Hypo / Hyperglycemic Management (close monitor of Blood Glucose) []  - 0 Ankle / Brachial Index (ABI) - do not check if billed separately X- 1 5 Vital Signs Adam Merritt, Adam Merritt ([id number]) Has the patient been seen at the hospital within the last three years: Yes Total Score: 80 Level Of Care: New/Established - Level 3 Electronic Signature(s) Signed: 05/10/2017 4:53:14 PM By: Montey Hora Entered By: Montey Hora on 05/10/2017 15:58:13 Adam Merritt ([id number]) -------------------------------------------------------------------------------- Encounter Discharge Information Details Patient Name: Adam Merritt Date of Service: 05/10/2017 3:15 PM Medical Record Number: [id number] Patient Account Number: [id number] Date of Birth/Sex: February 16, 1946 (71 y.o. Male) Treating RN: Montey Hora Primary Care Scottlynn Lindell: [name] Other Clinician: Referring Ebon Ketchum: [name] Treating Harneet Noblett/Extender: Frann Rider in Treatment: 6 Encounter  Discharge Information Items Discharge Pain Level: 0 Discharge Condition: Stable Ambulatory Status: Ambulatory Discharge Destination: Home Transportation: Private Auto Accompanied By: spouse Schedule Follow-up Appointment: Yes Medication Reconciliation completed and No provided to Patient/Care Sanah Kraska: Provided on Clinical Summary of Care: 05/10/2017 Form Type Recipient Paper Patient JT Electronic Signature(s) Signed: 05/16/2017 9:14:59 AM By: Ruthine Dose Entered By: Ruthine Dose on 05/10/2017 15:49:09 Adam Merritt ([id number]) -------------------------------------------------------------------------------- Lower Extremity Assessment Details Patient Name: Adam Merritt Date of Service: 05/10/2017 3:15 PM Medical Record Number: [id number] Patient Account Number: [id number] Date of Birth/Sex: 04/05/1946 (71 y.o. Male) Treating RN: Montey Hora Primary Care Vernica Wachtel: [name] Other Clinician: Referring Suprena Travaglini: [name] Treating Rileyann Florance/Extender: Frann Rider in Treatment: 6 Vascular Assessment Pulses: Dorsalis Pedis Palpable: [Left:Yes] Posterior Tibial Extremity colors, hair growth, and conditions: Extremity Color: [Left:Hyperpigmented] Hair Growth on Extremity: [Left:No] Temperature of Extremity: [Left:Warm] Capillary Refill: [Left:< 3 seconds] Electronic Signature(s) Signed: 05/10/2017 4:53:14 PM By: Montey Hora Entered By: Montey Hora on 05/10/2017 15:18:53 Adam Merritt ([id number]) -------------------------------------------------------------------------------- Multi Wound Chart Details Patient Name: Adam Merritt Date of Service: 05/10/2017 3:15 PM Medical Record Number: [id number] Patient Account Number: [id number] Date of Birth/Sex: 10-12-1945 (71 y.o. Male) Treating RN: Montey Hora Primary Care Thelma Viana: [name] Other Clinician: Referring Mitch Arquette: [name] Treating Hawken Bielby/Extender: Frann Rider in Treatment:  6 Vital Signs Height(in): 64 Pulse(bpm): 75 Weight(lbs): 298 Blood Pressure(mmHg): 149/85 Body Mass Index(BMI): 51 Temperature(F): 97.9 Respiratory Rate 20 (breaths/min): Photos: [8:No Photos] [N/A:N/A] Wound Location: [8:Left Calcaneus] [N/A:N/A] Wounding Event: [8:Pressure Injury] [N/A:N/A] Primary Etiology: [8:Pressure Ulcer] [N/A:N/A] Secondary Etiology: [8:Diabetic Wound/Ulcer of the Lower Extremity] [N/A:N/A] Comorbid History: [8:Anemia, Lymphedema, Chronic Obstructive Pulmonary Disease (COPD), Sleep Apnea, Congestive Heart Failure, Coronary Artery Disease, Hypertension, Peripheral Venous Disease, Type II Diabetes, Osteoarthritis, Neuropathy, Received  Chemotherapy] [N/A:N/A] Date Acquired: [8:04/16/2017] [N/A:N/A] Weeks of Treatment: [8:2] [N/A:N/A] Wound Status: [8:Open] [N/A:N/A] Measurements  L x W x D [8:1.1x1.9x0.1] [N/A:N/A] (cm) Area (cm) : [8:1.641] [N/A:N/A] Volume (cm) : [8:0.164] [N/A:N/A] % Reduction in Area: [8:71.00%] [N/A:N/A] % Reduction in Volume: [8:71.00%] [N/A:N/A] Classification: [8:Category/Stage III] [N/A:N/A] Exudate Amount: [8:Large] [N/A:N/A] Exudate Type: [8:Serous] [N/A:N/A] Exudate Color: [8:amber] [N/A:N/A] Wound Margin: [8:Flat and Intact] [N/A:N/A] Granulation Amount: [8:Large (67-100%)] [N/A:N/A] Granulation Quality: [8:Red] [N/A:N/A] Necrotic Amount: [8:Small (1-33%)] [N/A:N/A] Necrotic Tissue: [8:Eschar, Adherent Slough] [N/A:N/A] Exposed Structures: [8:Fat Layer (Subcutaneous Tissue) Exposed: Yes Fascia: No] [N/A:N/A] Tendon: No Muscle: No Joint: No Bone: No Epithelialization: Small (1-33%) N/A N/A Periwound Skin Texture: Excoriation: No N/A N/A Induration: No Callus: No Crepitus: No Rash: No Scarring: No Periwound Skin Moisture: Maceration: No N/A N/A Dry/Scaly: No Periwound Skin Color: Atrophie Blanche: No N/A N/A Cyanosis: No Ecchymosis: No Erythema: No Hemosiderin Staining: No Mottled: No Pallor:  No Rubor: No Temperature: No Abnormality N/A N/A Tenderness on Palpation: Yes N/A N/A Wound Preparation: Ulcer Cleansing: N/A N/A Rinsed/Irrigated with Saline Topical Anesthetic Applied: Other: lidocaine 4% Treatment Notes Wound #8 (Left Calcaneus) 1. Cleansed with: Clean wound with Normal Saline 2. Anesthetic Topical Lidocaine 4% cream to wound bed prior to debridement 4. Dressing Applied: Other dressing (specify in notes) 5. Secondary Dressing Applied Dry Gauze Foam Kerlix/Conform 7. Secured with Tape Notes silvercell, netting, heel cup Electronic Signature(s) Signed: 05/10/2017 3:48:42 PM By: Christin Fudge MD, FACS Entered By: Christin Fudge on 05/10/2017 15:48:42 Adam Merritt ([id number]) -------------------------------------------------------------------------------- [place] Details Patient Name: Adam Merritt Date of Service: 05/10/2017 3:15 PM Medical Record Number: [id number] Patient Account Number: [id number] Date of Birth/Sex: 05/29/1946 (71 y.o. Male) Treating RN: Montey Hora Primary Care Flavio Lindroth: [name] Other Clinician: Referring Weaver Tweed: [name] Treating Ginevra Tacker/Extender: Frann Rider in Treatment: 6 Active Inactive Electronic Signature(s) Signed: 05/10/2017 4:53:14 PM By: Montey Hora Entered By: Montey Hora on 05/10/2017 15:18:59 Adam Merritt ([id number]) -------------------------------------------------------------------------------- Pain Assessment Details Patient Name: Adam Merritt Date of Service: 05/10/2017 3:15 PM Medical Record Number: [id number] Patient Account Number: [id number] Date of Birth/Sex: 03-13-1946 (71 y.o. Male) Treating RN: Montey Hora Primary Care Nell Schrack: [name] Other Clinician: Referring Cande Mastropietro: [name] Treating Gaila Engebretsen/Extender: Frann Rider in Treatment: 6 Active Problems Location of Pain Severity and Description of Pain Patient Has Paino No Site  Locations Pain Management and Medication Current Pain Management: Electronic Signature(s) Signed: 05/10/2017 4:53:14 PM By: Montey Hora Entered By: Montey Hora on 05/10/2017 15:11:52 Adam Merritt ([id number]) -------------------------------------------------------------------------------- Patient/Caregiver Education Details Patient Name: Adam Merritt Date of Service: 05/10/2017 3:15 PM Medical Record Number: [id number] Patient Account Number: [id number] Date of Birth/Gender: [birth year] (71 y.o. Male) Treating RN: Montey Hora Primary Care Physician: [name] Other Clinician: Referring Physician: [name] Treating Physician/Extender: Frann Rider in Treatment: 6 Education Assessment Education Provided To: Patient Education Topics Provided Wound/Skin Impairment: Handouts: Other: change dressing as ordered Methods: Demonstration, Explain/Verbal Responses: State content correctly Electronic Signature(s) Signed: 05/10/2017 4:53:14 PM By: Montey Hora Entered By: Montey Hora on 05/10/2017 Adam Merritt, Adam W. ([id number]) -------------------------------------------------------------------------------- Wound Assessment Details Patient Name: Adam Merritt Date of Service: 05/10/2017 3:15 PM Medical Record Number: [id number] Patient Account Number: [id number] Date of Birth/Sex: 1945-11-10 (71 y.o. Male) Treating RN: Montey Hora Primary Care Orilla Templeman: [name] Other Clinician: Referring Geovani Tootle: [name] Treating Brayln Duque/Extender: Frann Rider in Treatment: 6 Wound Status Wound Number: 8 Primary Pressure Ulcer Etiology: Wound Location: Left Calcaneus Secondary Diabetic Wound/Ulcer of the Lower Extremity Wounding Event: Pressure Injury Etiology: Date Acquired: 04/16/2017 Wound Open Weeks Of  Treatment: 2 Status: Clustered Wound: No Comorbid Anemia, Lymphedema, Chronic Obstructive History: Pulmonary Disease (COPD), Sleep  Apnea, Congestive Heart Failure, Coronary Artery Disease, Hypertension, Peripheral Venous Disease, Type II Diabetes, Osteoarthritis, Neuropathy, Received Chemotherapy Photos Photo Uploaded By: Montey Hora on 05/10/2017 15:59:54 Wound Measurements Length: (cm) 1.1 Width: (cm) 1.9 Depth: (cm) 0.1 Area: (cm) 1.641 Volume: (cm) 0.164 % Reduction in Area: 71% % Reduction in Volume: 71% Epithelialization: Small (1-33%) Tunneling: No Undermining: No Wound Description Classification: Category/Stage III Wound Margin: Flat and Intact Exudate Amount: Large Exudate Type: Serous Exudate Color: amber Foul Odor After Cleansing: No Slough/Fibrino Yes Wound Bed Granulation Amount: Large (67-100%) Exposed Structure Granulation Quality: Red Fascia Exposed: No Necrotic Amount: Small (1-33%) Fat Layer (Subcutaneous Tissue) Exposed: Yes Necrotic Quality: Eschar, Adherent Slough Tendon Exposed: No Adam Merritt, Adam Merritt ([id number]) Muscle Exposed: No Joint Exposed: No Bone Exposed: No Periwound Skin Texture Texture Color No Abnormalities Noted: No No Abnormalities Noted: No Callus: No Atrophie Blanche: No Crepitus: No Cyanosis: No Excoriation: No Ecchymosis: No Induration: No Erythema: No Rash: No Hemosiderin Staining: No Scarring: No Mottled: No Pallor: No Moisture Rubor: No No Abnormalities Noted: No Dry / Scaly: No Temperature / Pain Maceration: No Temperature: No Abnormality Tenderness on Palpation: Yes Wound Preparation Ulcer Cleansing: Rinsed/Irrigated with Saline Topical Anesthetic Applied: Other: lidocaine 4%, Treatment Notes Wound #8 (Left Calcaneus) 1. Cleansed with: Clean wound with Normal Saline 2. Anesthetic Topical Lidocaine 4% cream to wound bed prior to debridement 4. Dressing Applied: Other dressing (specify in notes) 5. Secondary Dressing Applied Dry Gauze Foam Kerlix/Conform 7. Secured with Tape Notes silvercell, netting, heel  cup Electronic Signature(s) Signed: 05/10/2017 4:53:14 PM By: Montey Hora Entered By: Montey Hora on 05/10/2017 15:17:11 Adam Merritt ([id number]) -------------------------------------------------------------------------------- [place] Details Patient Name: Adam Merritt Date of Service: 05/10/2017 3:15 PM Medical Record Number: [id number] Patient Account Number: [id number] Date of Birth/Sex: [birth year] (71 y.o. Male) Treating RN: Montey Hora Primary Care Davinci Glotfelty: [name] Other Clinician: Referring Laquitha Heslin: [name] Treating Ashyla Luth/Extender: Frann Rider in Treatment: 6 Vital Signs Time Taken: 15:17 Temperature (F): 97.9 Height (in): 64 Pulse (bpm): 75 Weight (lbs): 298 Respiratory Rate (breaths/min): 20 Body Mass Index (BMI): 51.1 Blood Pressure (mmHg): 149/85 Reference Range: 80 - 120 mg / dl Electronic Signature(s) Signed: 05/10/2017 4:53:14 PM By: Montey Hora Entered By: Montey Hora on 05/10/2017 15:17:45

## 2017-05-21 ENCOUNTER — Encounter: Payer: Medicare Other | Admitting: Occupational Therapy

## 2017-05-24 ENCOUNTER — Encounter: Payer: Medicare Other | Admitting: Occupational Therapy

## 2017-05-25 ENCOUNTER — Encounter: Payer: Medicare Other | Admitting: Physician Assistant

## 2017-05-25 DIAGNOSIS — E11620 Type 2 diabetes mellitus with diabetic dermatitis: Secondary | ICD-10-CM | POA: Diagnosis not present

## 2017-05-27 NOTE — Progress Notes (Signed)
Adam Merritt, Adam Merritt ([id number]) Visit Report for 05/25/2017 Chief Complaint Document Details Patient Name: Adam Merritt Date of Service: 05/25/2017 1:30 PM Medical Record Number: [id number] Patient Account Number: [id number] Date of Birth/Sex: [birth year] (71 y.o. Male) Treating RN: Montey Hora Primary Care Provider: [name] Other Clinician: Referring Provider: [name] Treating Provider/Extender: Melburn Hake, [name]: 8 Information Obtained from: Patient Chief Complaint the patient was recently discharged and now is back for a pressure ulcer on his left heel Electronic Signature(s) Signed: 05/27/2017 1:59:17 AM By: Worthy Keeler PA-C Entered By: Worthy Keeler on 05/25/2017 13:44:40 Adam Merritt ([id number]) -------------------------------------------------------------------------------- Debridement Details Patient Name: Adam Merritt Date of Service: 05/25/2017 1:30 PM Medical Record Number: [id number] Patient Account Number: [id number] Date of Birth/Sex: 02-10-1946 (71 y.o. Male) Treating RN: Montey Hora Primary Care Provider: [name] Other Clinician: Referring Provider: [name] Treating Provider/Extender: Melburn Hake, [name]: 8 Debridement Performed for Wound #8 Left Calcaneus Assessment: Performed By: Physician STONE III, HOYT E., PA-C Debridement: Debridement Severity of Tissue Pre Fat layer exposed Debridement: Pre-procedure Verification/Time Yes - 13:47 Out Taken: Start Time: 13:47 Pain Control: Lidocaine 4% Topical Solution Level: Skin/Subcutaneous Tissue Total Area Debrided (L x W): 0.7 (cm) x 1.5 (cm) = 1.05 (cm) Tissue and other material Viable, Non-Viable, Fibrin/Slough, Subcutaneous debrided: Instrument: Curette Bleeding: Minimum Hemostasis Achieved: Pressure End Time: 13:49 Procedural Pain: 0 Post Procedural Pain: 0 Response to Treatment: Procedure was tolerated well Post Debridement  Measurements of Total Wound Length: (cm) 0.7 Stage: Category/Stage III Width: (cm) 1.5 Depth: (cm) 0.2 Volume: (cm) 0.165 Character of Wound/Ulcer Post Improved Debridement: Severity of Tissue Post Debridement: Fat layer exposed Post Procedure Diagnosis Same as Pre-procedure Electronic Signature(s) Signed: 05/25/2017 4:38:31 PM By: Montey Hora Signed: 05/27/2017 1:59:17 AM By: Worthy Keeler PA-C Entered By: Montey Hora on 05/25/2017 13:50:33 Adam Merritt ([id number]) -------------------------------------------------------------------------------- HPI Details Patient Name: Adam Merritt Date of Service: 05/25/2017 1:30 PM Medical Record Number: [id number] Patient Account Number: [id number] Date of Birth/Sex: Mar 03, 1946 (71 y.o. Male) Treating RN: Montey Hora Primary Care Provider: [name] Other Clinician: Referring Provider: [name] Treating Provider/Extender: STONE III, [name]: 8 History of Present Illness Location: left lower extremity lymphedema with a small open wound which is fairly superficial on his left anterior calf Quality: Patient reports No Pain. Severity: Patient states wound are getting better Duration: his happened only last week Context: The wound appeared gradually over time Modifying Factors: he is being treated at the lymphedema clinic with regular wrapping of his left lower extremity Associated Signs and Symptoms: Patient reports having difficulty standing for long periods. HPI Description: 71 year old gentleman with chronic bilateral lower extremity lymphedema associated with diabetes mellitus and obesity has been very compliant with his management and has been going to the lymphedema clinic in the recent past. He now returns with a small superficial ulcer on the left anterior lower calf about a week's duration He has been under the treatment of the lymphedema clinic for the last month. 04/20/2017 -- the patient was  discharged a few weeks ago for lymphedema with ulceration and is under the care of the lymphedema clinic. However he had a fall about 2 weeks ago where he lost consciousness and was laying on the floor for about an hour. During this time he has sustained a pressure injury to the left heel. Of note he has had a vascular consult by Dr. Annamarie Major -- on 09/29/2013 and Dr. Trula Slade  opinion was:"I have reviewed his outside ultrasound which shows an ankle-brachial index of 0.72 on the left and 1.0 on the right.o This study was repeated at our office.o The ankle-brachial index on the right was 1.26 with triphasic waveforms.o On the left it was a 1.04 with biphasic waveforms.o The patient had a toe pressure of 121 on the right and 100 on the left. In summary, I would not recommend any further imaging , as I do not think his symptoms are related to arterial insufficiency, " 05/25/17 on evaluation today patient appears to be doing excellent in regard to his left heel wound. He has been tolerating the dressing changes without complication. No fevers, chills, nausea, or vomiting noted at this time. Overall his wound seems to be progressing very nicely and he is having minimal pain he states other than when he comes in here and we clean it. ===== Old notes: 71 year old gentleman seen earlier at our clinic in June 2016 and again in June 2017 is noncompliant with recommendations and does not follow-up regularly. 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 He had referred him to the lymphedema clinic and he was accepted but he did not follow-up with them. At that stage he had agreed to get a repeat arterial study done and he has seen Dr. Trula Slade in [place]. This time around he has been seen by his PCP Dr.  Tiburcio Bash who referred him urgently to [place] for bilateral lower extremity cellulitis in spite of being on antibiotics -- he was placed on Keflex and 500 mg 3 times a day for 10 days. His comorbidities include coronary artery disease, diabetes mellitus, hypertension, Bell's palsy and chronic lymphedema. he was also recently admitted to the hospital on April 13 and kept overnight with the diagnosis of a TIA and a left ICA stenosis to follow-up with vascular surgery as an outpatient 10/05/2016 -- patient has been compliant this week and left his compression and dressing on for the entire week and had home health change it appropriately. 11/13/2016 -- his insurance will not cover compression stockings for lymphedema and hence we will have to send him to the lymphedema clinic as soon as we have finished with his healing. Adam Merritt, Adam Merritt ([id number]) 11/20/2016 -- his lymphedema clinic appointment is still pending but other than that he has done very well and we are ready to transition him over. 11/27/16 patient's wounds appeared to be completely healed on evaluation today. He still has bilateral left edema and has been referred to the lymphedema clinic although we are still waiting to hear back from them on scheduling. Apparently she has to be discharged from home health services prior to being able to establish with the lymphedema clinic. At this point considering he is completely healed from the standpoint of wounds of the bilateral lower extremities I think it is appropriate to discharge him from home health and initiate weekly wraps on our end. There is no evidence of infection. 01/01/2017 -- the patient was recently discharged on 12/04/2016 and asked to follow-up in the lymphedema clinic. However they seem to be some issues with his going to the lymphedema clinic and then referring him to dermatology for an opinion regarding his skin. Ultimately the patient did not have a follow-up and has  now  redeveloped wheezing from the right lower extremity and ulceration on the left lower extremity. The patient and his wife are rather helpless and do not seem to be very compliant with their care. 01/08/17 on evaluation today patient has just a single lower extremity wounds noted. Fortunately he is having no significant pain, no bowe or bladder dysfunction, no nausea and no vomiting. There's nothing to indicate a systemic worsening infection. 01/16/17 on a violation today patient appears to be doing fairly well in regard to his bilateral lower extremities. He has a very small ulcer which is barely openable left lower extremity and otherwise is completely closed in regard to both legs. I believe we may be ready to get him back to the lymphedema clinic at this point. ==== Old Notes: 71 year old gentleman who comes with bilateral lower limb edema for over 20 years and recent attack of blisters and redness for about 2 weeks. The patient says he has had bilateral lower limb edema since 1996. He has been noncompliant with wearing wraps in the past and even was given a boot to wear at night which she never tolerated and didn't wear it. He has a past medical history of sleep apnea, neuropathy with diabetes, hypertension, hyperlipidemia, coronary artery disease, varicose veins, carotid artery disease and is status post CABG and cholecystectomy. He was seen by the vascular surgeon on 09/29/2013 and Dr. Trula Slade opinion was:"I have reviewed his outside ultrasound which shows an ankle-brachial index of 0.72 on the left and 1.0 on the right.o This study was repeated at our office.o The ankle-brachial index on the right was 1.26 with triphasic waveforms.o On the left it was a 1.04 with biphasic waveforms.o The patient had a toe pressure of 121 on the right and 100 on the left. In summary, I would not recommend any further imaging , as I do not think his symptoms are related to arterial insufficiency, but rather  secondary to diabetic neuropathy.o The patient still has room for increasing his Neurontin dose, which he states does help him.o I have scheduled him to come back in one year for a repeat arterial evaluation." in June 2016, a year ago, he has a venous duplex study to be done. He was also going to be accepted to the lymphedema clinic but I did not believe the patient went regularly for this. ==== Electronic Signature(s) Signed: 05/27/2017 1:59:17 AM By: Worthy Keeler PA-C Entered By: Worthy Keeler on 05/25/2017 13:56:46 Adam Merritt ([id number]) -------------------------------------------------------------------------------- Physical Exam Details Patient Name: Adam Merritt Date of Service: 05/25/2017 1:30 PM Medical Record Number: [id number] Patient Account Number: [id number] Date of Birth/Sex: July 15, 1945 (71 y.o. Male) Treating RN: Montey Hora Primary Care Provider: [name] Other Clinician: Referring Provider: [name] Treating Provider/Extender: Melburn Hake, [name]: 8 Constitutional Obese and well-hydrated in no acute distress. Respiratory normal breathing without difficulty. Psychiatric this patient is able to make decisions and demonstrates good insight into disease process. Alert and Oriented x 3. pleasant and cooperative. Notes Patient did have slough covering the wound bed although this was minimal at this point in time. No redness surrounding the wound bed was noted. I did sharply debride the wound with a curette which patient tolerated well without complication. Electronic Signature(s) Signed: 05/27/2017 1:59:17 AM By: Worthy Keeler PA-C Entered By: Worthy Keeler on 05/25/2017 13:57:45 Adam Merritt ([id number]) -------------------------------------------------------------------------------- Physician Orders Details Patient Name: Adam Merritt Date of Service: 05/25/2017 1:30 PM Medical Record Number: [id number]  Patient Account  Number: [id number] Date of Birth/Sex: October 01, 1945 (71 y.o. 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 o May Shower, gently pat wound dry prior to applying new dressing. Anesthetic (add to Medication List) Wound #8 Left Calcaneus o Topical Lidocaine 4% cream applied to wound bed prior to debridement (In Clinic Only). Primary Wound Dressing Wound #8 Left Calcaneus o Aquacel Ag Secondary Dressing Wound #8 Left Calcaneus o Dry Gauze o Conform/Kerlix - DO NOT WRAP TIGHT o Foam - Allevyn Heel Cup Dressing Change Frequency Wound #8 Left Calcaneus o Change dressing every other day. Follow-up Appointments o Return Appointment in 1 week. Edema Control Wound #8 Left Calcaneus o Patient to wear own compression stockings Additional Orders / Instructions Wound #8 Left Calcaneus o Increase protein intake. Adam Merritt, Adam Merritt ([id number]) Home Health Wound #8 Left Calcaneus o [place] Visits - [place] Nurse may visit PRN to address patientos wound care needs. o FACE TO FACE ENCOUNTER: MEDICARE and MEDICAID PATIENTS: I certify that this patient is under my care and that I had a face-to-face encounter that meets the physician face-to-face encounter requirements with this patient on this date. The encounter with the patient was in whole or in part for the following MEDICAL CONDITION:  (primary reason for [place]) MEDICAL NECESSITY: I certify, that based on my findings, NURSING services are a medically necessary home health service. HOME BOUND STATUS: I certify that my clinical findings support that this patient is homebound (i.e., Due to illness or injury, pt requires aid of supportive devices such as crutches, cane, wheelchairs, walkers, the use of special transportation or the assistance of another person to leave their place of residence. There is a normal inability to leave the home and doing so requires considerable and taxing effort. Other absences are for medical reasons / religious services and are infrequent or of short duration when for other reasons). o If current dressing causes regression in wound condition, may D/C ordered dressing product/s and apply Normal Saline Moist Dressing daily until next [place] / Other MD appointment. [place] of regression in wound condition at [phone]. o Please direct any NON-WOUND related issues/requests for orders to patient's Primary Care Physician Electronic Signature(s) Signed: 05/25/2017 4:38:31 PM By: Montey Hora Signed: 05/27/2017 1:59:17 AM By: Worthy Keeler PA-C Entered By: Montey Hora on 05/25/2017 13:55:05 Adam Merritt ([id number]) -------------------------------------------------------------------------------- Problem List Details Patient Name: Adam Merritt Date of Service: 05/25/2017 1:30 PM Medical Record Number: [id number] Patient Account Number: [id number] Date of Birth/Sex: [birth year] (71 y.o. 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 03/26/2017 Yes I89.0 Lymphedema, not  elsewhere classified 03/26/2017 Yes E66.01 Morbid (severe) obesity due to excess calories 03/26/2017 Yes L97.221 Non-pressure chronic ulcer of left calf limited to breakdown of skin 03/26/2017 Yes L89.623 Pressure  ulcer of left heel, stage 3 04/20/2017 Yes Inactive Problems Resolved Problems Electronic Signature(s) Signed: 05/27/2017 1:59:17 AM By: Worthy Keeler PA-C Entered By: Worthy Keeler on 05/25/2017 13:44:10 Adam Merritt ([id number]) -------------------------------------------------------------------------------- Progress Note Details Patient Name: Adam Merritt Date of Service: 05/25/2017 1:30 PM Medical Record Number: [id number] Patient Account Number: [id number] Date of Birth/Sex: 06-28-1945 (71 y.o. Male) Treating RN: Montey Hora Primary Care Provider: [name] Other Clinician: Referring Provider: [name] Treating Provider/Extender: Melburn Hake, [name]: 8 Subjective Chief Complaint Information obtained from Patient the patient was recently discharged and now is back for a pressure ulcer on his left heel History of Present Illness (HPI) The following HPI elements were documented for the patient's wound: Location: left lower extremity lymphedema with a small open wound which is fairly superficial on his left anterior calf Quality: Patient reports No Pain. Severity: Patient states wound are getting better Duration: his happened only last week Context: The wound appeared gradually over time Modifying Factors: he is being treated at the lymphedema clinic with regular wrapping of his left lower extremity Associated Signs and Symptoms: Patient reports having difficulty standing for long periods. 71 year old gentleman with chronic bilateral lower extremity lymphedema associated with diabetes mellitus and obesity has been very compliant with his management and has been going to the lymphedema clinic in the recent past. He now returns with a small  superficial ulcer on the left anterior lower calf about a week's duration He has been under the treatment of the lymphedema clinic for the last month. 04/20/2017 -- the patient was discharged a few weeks ago for lymphedema with ulceration and is under the care of the lymphedema clinic. However he had a fall about 2 weeks ago where he lost consciousness and was laying on the floor for about an hour. During this time he has sustained a pressure injury to the left heel. Of note he has had a vascular consult by Dr. Annamarie Major -- on 09/29/2013 and Dr. Trula Slade opinion was:"I have reviewed his outside ultrasound which shows an ankle-brachial index of 0.72 on the left and 1.0 on the right. This study was repeated at our office. The ankle-brachial index on the right was 1.26 with triphasic waveforms. On the left it was a 1.04 with biphasic waveforms. The patient had a toe pressure of 121 on the right and 100 on the left. In summary, I would not recommend any further imaging , as I do not think his symptoms are related to arterial insufficiency, " 05/25/17 on evaluation today patient appears to be doing excellent in regard to his left heel wound. He has been tolerating the dressing changes without complication. No fevers, chills, nausea, or vomiting noted at this time. Overall his wound seems to be progressing very nicely and he is having minimal pain he states other than when he comes in here and we clean it. ===== Old notes: 71 year old gentleman seen earlier at our clinic in June 2016 and again in June 2017 is noncompliant with recommendations and does not follow-up regularly. 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 He had referred him to the lymphedema clinic and he was accepted but he did  not follow-up with them. At that stage he had agreed to get a  repeat arterial study done and he has seen Dr. Trula Slade in [place]. This time around he has been seen by his PCP Dr. Tiburcio Bash who referred him urgently to [place] for bilateral lower extremity cellulitis in spite of being on antibiotics -- he was placed on Keflex and 500 mg 3 times a day for 10 days. His comorbidities include coronary artery disease, diabetes mellitus, hypertension, Bell's palsy and chronic lymphedema. he was also recently admitted to the hospital on April 13 and kept overnight with the diagnosis of a TIA and a left ICA stenosis Adam Merritt, Adam Merritt ([id number]) to follow-up with vascular surgery as an outpatient 10/05/2016 -- patient has been compliant this week and left his compression and dressing on for the entire week and had home health change it appropriately. 11/13/2016 -- his insurance will not cover compression stockings for lymphedema and hence we will have to send him to the lymphedema clinic as soon as we have finished with his healing. 11/20/2016 -- his lymphedema clinic appointment is still pending but other than that he has done very well and we are ready to transition him over. 11/27/16 patient's wounds appeared to be completely healed on evaluation today. He still has bilateral left edema and has been referred to the lymphedema clinic although we are still waiting to hear back from them on scheduling. Apparently she has to be discharged from home health services prior to being able to establish with the lymphedema clinic. At this point considering he is completely healed from the standpoint of wounds of the bilateral lower extremities I think it is appropriate to discharge him from home health and initiate weekly wraps on our end. There is no evidence of infection. 01/01/2017 -- the patient was recently discharged on 12/04/2016 and asked to follow-up in the lymphedema clinic. However they seem to be some  issues with his going to the lymphedema clinic and then referring him to dermatology for an opinion regarding his skin. Ultimately the patient did not have a follow-up and has now redeveloped wheezing from the right lower extremity and ulceration on the left lower extremity. The patient and his wife are rather helpless and do not seem to be very compliant with their care. 01/08/17 on evaluation today patient has just a single lower extremity wounds noted. Fortunately he is having no significant pain, no bowe or bladder dysfunction, no nausea and no vomiting. There's nothing to indicate a systemic worsening infection. 01/16/17 on a violation today patient appears to be doing fairly well in regard to his bilateral lower extremities. He has a very small ulcer which is barely openable left lower extremity and otherwise is completely closed in regard to both legs. I believe we may be ready to get him back to the lymphedema clinic at this point. ==== Old Notes: 71 year old gentleman who comes with bilateral lower limb edema for over 20 years and recent attack of blisters and redness for about 2 weeks. The patient says he has had bilateral lower limb edema since 1996. He has been noncompliant with wearing wraps in the past and even was given a boot to wear at night which she never tolerated and didn't wear it. He has a past medical history of sleep apnea, neuropathy with diabetes, hypertension, hyperlipidemia, coronary artery disease, varicose veins, carotid artery disease and is status post CABG and cholecystectomy. He was seen by the vascular surgeon on 09/29/2013 and Dr. Trula Slade opinion was:"I have reviewed his outside ultrasound which shows an ankle-brachial index of 0.72 on  the left and 1.0 on the right. This study was repeated at our office. The ankle-brachial index on the right was 1.26 with triphasic waveforms. On the left it was a 1.04 with biphasic waveforms. The patient had a toe pressure of 121  on the right and 100 on the left. In summary, I would not recommend any further imaging , as I do not think his symptoms are related to arterial insufficiency, but rather secondary to diabetic neuropathy. The patient still has room for increasing his Neurontin dose, which he states does help him. I have scheduled him to come back in one year for a repeat arterial evaluation." in June 2016, a year ago, he has a venous duplex study to be done. He was also going to be accepted to the lymphedema clinic but I did not believe the patient went regularly for this. ==== Patient History Information obtained from Patient. Family History Cancer - Paternal Grandparents, Kidney Disease - Siblings, Lung Disease - Mother, No family history of Diabetes, Heart Disease, Hereditary Spherocytosis, Hypertension, Seizures, Stroke, Thyroid Problems, Tuberculosis. Social History Adam Merritt, Adam Merritt ([id number]) Never smoker, Marital Status - Married, Alcohol Use - Never, Drug Use - No History, Caffeine Use - Never. Medical History Hospitalization/Surgery History - 11/03/2013, [facility], bronchitis. Medical And Surgical History Notes Cardiovascular varicose veins Neurologic CVA - 1992 Review of Systems (ROS) Constitutional Symptoms (General Health) Denies complaints or symptoms of Fever, Chills. Respiratory The patient has no complaints or symptoms. Cardiovascular Complains or has symptoms of LE edema. Psychiatric The patient has no complaints or symptoms. Objective Constitutional Obese and well-hydrated in no acute distress. Vitals Time Taken: 1:34 PM, Height: 64 in, Weight: 298 lbs, BMI: 51.1, Temperature: 98.3 F, Pulse: 79 bpm, Respiratory Rate: 20 breaths/min, Blood Pressure: 153/69 mmHg. Respiratory normal breathing without difficulty. Psychiatric this patient is able to make decisions and demonstrates good insight into disease process. Alert and Oriented x 3. pleasant and cooperative. General Notes:  Patient did have slough covering the wound bed although this was minimal at this point in time. No redness surrounding the wound bed was noted. I did sharply debride the wound with a curette which patient tolerated well without complication. Integumentary (Hair, Skin) Wound #8 status is Open. Original cause of wound was Pressure Injury. The wound is located on the Left Calcaneus. The wound measures 0.7cm length x 1.5cm width x 0.1cm depth; 0.825cm^2 area and 0.082cm^3 volume. There is Fat Layer (Subcutaneous Tissue) Exposed exposed. There is no tunneling or undermining noted. There is a large amount of serous drainage noted. The wound margin is flat and intact. There is large (67-100%) red granulation within the wound bed. There is a small (1-33%) amount of necrotic tissue within the wound bed including Eschar and Adherent Slough. The periwound skin appearance did not exhibit: Callus, Crepitus, Excoriation, Induration, Rash, Scarring, Dry/Scaly, Maceration, Atrophie Blanche, Cyanosis, Ecchymosis, Hemosiderin Staining, Mottled, Pallor, Rubor, Erythema. Periwound temperature was noted Adam Merritt, Adam Merritt. ([id number]) as No Abnormality. The periwound has tenderness on palpation. 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 There was a Skin/Subcutaneous Tissue Debridement ([id number]) debridement with  total area of 1.05 sq cm performed by STONE III, HOYT E., PA-C. with the following instrument(s): Curette to remove Viable and Non- Viable tissue/material including  Fibrin/Slough and Subcutaneous after achieving pain control using Lidocaine 4% Topical Solution. A time out was conducted at 13:47, prior to the start of the procedure. A Minimum amount of bleeding was controlled with Pressure. The procedure was tolerated well with a pain level of 0 throughout and a pain level of 0 following the procedure. Post Debridement Measurements: 0.7cm length x 1.5cm width x 0.2cm depth; 0.165cm^3 volume. Post debridement Stage noted as Category/Stage III. Character of Wound/Ulcer Post Debridement is improved. Severity of Tissue Post Debridement is: Fat layer exposed. Post procedure Diagnosis Wound #8: Same as Pre-Procedure Plan Wound Cleansing: Wound #8 Left Calcaneus: Clean wound with Normal Saline. May Shower, gently pat wound dry prior to applying new dressing. Anesthetic (add to Medication List): Wound #8 Left Calcaneus: Topical Lidocaine 4% cream applied to wound bed prior to debridement (In Clinic Only). Primary Wound Dressing: Wound #8 Left Calcaneus: Aquacel Ag Secondary Dressing: Wound #8 Left Calcaneus: Dry Gauze Conform/Kerlix - DO NOT WRAP TIGHT Foam - Allevyn Heel Cup Dressing Change Frequency: Adam Merritt, Adam Merritt ([id number]) Wound #8 Left Calcaneus: Change dressing every other day. Follow-up Appointments: Return Appointment in 1 week. Edema Control: Wound #8 Left Calcaneus: Patient to wear own compression stockings Additional Orders / Instructions: Wound #8 Left Calcaneus: Increase protein intake. Home Health: Wound #8 Left Calcaneus: Continue Home Health Visits - [employer] Nurse may visit PRN to address patient s wound care needs. FACE TO FACE ENCOUNTER: MEDICARE and MEDICAID PATIENTS: I certify that this patient is under my care and that I had a face-to-face encounter that meets the physician face-to-face encounter requirements with this patient on this date. The encounter with the patient was in whole or in part for the  following MEDICAL CONDITION: (primary reason for [place]) MEDICAL NECESSITY: I certify, that based on my findings, NURSING services are a medically necessary home health service. HOME BOUND STATUS: I certify that my clinical findings support that this patient is homebound (i.e., Due to illness or injury, pt requires aid of supportive devices such as crutches, cane, wheelchairs, walkers, the use of special transportation or the assistance of another person to leave their place of residence. There is a normal inability to leave the home and doing so requires considerable and taxing effort. Other absences are for medical reasons / religious services and are infrequent or of short duration when for other reasons). If current dressing causes regression in wound condition, may D/C ordered dressing product/s and apply Normal Saline Moist Dressing daily until next [place] / Other MD appointment. [place] of regression in wound condition at [phone]. Please direct any NON-WOUND related issues/requests for orders to patient's Primary Care Physician I am going to recommend that we continue with the Current wound care measures for the next week. Hopefully this will continue to show signs of good improvement. Please see above for specific wound care orders. We will see patient for re- evaluation in 1 week(s) here in the clinic. If anything worsens or changes patient will contact our office for additional recommendations. Electronic Signature(s) Signed: 05/27/2017 1:59:17 AM By: Worthy Keeler PA-C Entered By: Worthy Keeler on 05/25/2017 13:58:06 Adam Merritt ([id number]) -------------------------------------------------------------------------------- ROS/PFSH Details Patient Name: Adam Merritt Date of Service: 05/25/2017 1:30 PM Medical Record Number: [id number] Patient Account Number: [id number] Date of Birth/Sex:  Oct 08, 1945 (71 y.o. Male) Treating RN:  Montey Hora Primary Care Provider: [name] Other Clinician: Referring Provider: [name] Treating Provider/Extender: Melburn Hake, [name]: 8 Information Obtained From Patient Wound History Do you currently have one or more open woundso Yes How many open wounds do you currently haveo 1 Approximately how long have you had your woundso 10 days How have you been treating your wound(s) until nowo dry dressing Has your wound(s) ever healed and then re-openedo No Have you had any lab work done in the past montho No Have you tested positive for an antibiotic resistant organism (MRSA, VRE)o No Have you tested positive for osteomyelitis (bone infection)o No Have you had any tests for circulation on your legso Yes Where was the test doneo [facility] Constitutional Symptoms (General Health) Complaints and Symptoms: Negative for: Fever; Chills Cardiovascular Complaints and Symptoms: Positive for: LE edema Medical History: Positive for: Congestive Heart Failure; Coronary Artery Disease; Hypertension; Peripheral Venous Disease Negative for: Angina; Arrhythmia; Deep Vein Thrombosis; Hypotension; Myocardial Infarction; Peripheral Arterial Disease; Phlebitis; Vasculitis Past Medical History Notes: varicose veins Eyes Medical History: Negative for: Cataracts; Glaucoma; Optic Neuritis Ear/Nose/Mouth/Throat Medical History: Negative for: Chronic sinus problems/congestion; Middle ear problems Hematologic/Lymphatic Medical History: Positive for: Anemia - aplastic anemia; Lymphedema Negative for: Hemophilia; Human Immunodeficiency Virus; Sickle Cell Disease Respiratory Adam Merritt, GOBERT. ([id number]) Complaints and Symptoms: No Complaints or Symptoms Medical History: Positive for: Chronic Obstructive Pulmonary Disease (COPD); Sleep Apnea Negative for: Aspiration; Asthma; Pneumothorax; Tuberculosis Gastrointestinal Medical History: Negative for: Cirrhosis ; Colitis; Crohnos;  Hepatitis A; Hepatitis B; Hepatitis C Endocrine Medical History: Positive for: Type II Diabetes Negative for: Type I Diabetes Time with diabetes: since 2006 Treated with: Insulin Blood sugar tested every day: Yes Tested : Blood sugar testing results: Breakfast: 209 Genitourinary Medical History: Negative for: End Stage Renal Disease Immunological Medical History: Negative for: Lupus Erythematosus; Raynaudos; Scleroderma Integumentary (Skin) Medical History: Negative for: History of Burn; History of pressure wounds Musculoskeletal Medical History: Positive for: Osteoarthritis Negative for: Gout; Rheumatoid Arthritis; Osteomyelitis Neurologic Medical History: Positive for: Neuropathy Negative for: Dementia; Quadriplegia; Paraplegia; Seizure Disorder Past Medical History Notes: CVA - 1992 Oncologic Medical History: Positive for: Received Chemotherapy - last dose 2006 Negative for: Received Radiation Adam Merritt, Adam Merritt ([id number]) Psychiatric Complaints and Symptoms: No Complaints or Symptoms Medical History: Negative for: Anorexia/bulimia; Confinement Anxiety Immunizations Pneumococcal Vaccine: Received Pneumococcal Vaccination: Yes Implantable Devices Hospitalization / Surgery History Name of Hospital Purpose of Hospitalization/Surgery Date [facility] bronchitis 11/03/2013 Family and Social History Cancer: Yes - Paternal Grandparents; Diabetes: No; Heart Disease: No; Hereditary Spherocytosis: No; Hypertension: No; Kidney Disease: Yes - Siblings; Lung Disease: Yes - Mother; Seizures: No; Stroke: No; Thyroid Problems: No; Tuberculosis: No; Never smoker; Marital Status - Married; Alcohol Use: Never; Drug Use: No History; Caffeine Use: Never; Financial Concerns: No; Food, Clothing or Shelter Needs: No; Support System Lacking: No; Transportation Concerns: No; Advanced Directives: No; Patient does not want information on Advanced Directives Physician Affirmation I have reviewed and  agree with the above information. Electronic Signature(s) Signed: 05/25/2017 4:38:31 PM By: Montey Hora Signed: 05/27/2017 1:59:17 AM By: Worthy Keeler PA-C Entered By: Worthy Keeler on 05/25/2017 13:57:14 Adam Merritt ([id number]) -------------------------------------------------------------------------------- SuperBill Details Patient Name: Adam Merritt Date of Service: 05/25/2017 Medical Record Number: [id number] Patient Account Number: [id number] Date of Birth/Sex: 1945/08/15 (71 y.o. 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

## 2017-05-27 NOTE — Progress Notes (Signed)
LEVERN, PITTER ([id number]) Visit Report for 05/25/2017 Arrival Information Details Patient Name: Adam Merritt. Date of Service: 05/25/2017 1:30 PM Medical Record Number: [id number] Patient Account Number: [id number] Date of Birth/Sex: 24-Jun-1945 (71 y.o. Male) Treating RN: Montey Hora Primary Care Carmisha Larusso: Adam Merritt Other Clinician: Referring Delshon Blanchfield: Adam Merritt Treating Eytan Carrigan/Extender: Melburn Hake, [name]: 8 Visit Information History Since Last Visit Added or deleted any medications: No Patient Arrived: Cane Any new allergies or adverse reactions: No Arrival Time: 13:29 Had a fall or experienced change in No Accompanied By: spouse activities of daily living that may affect Transfer Assistance: None risk of falls: Patient Identification Verified: Yes Signs or symptoms of abuse/neglect since last visito No Secondary Verification Process Completed: Yes Hospitalized since last visit: No Patient Requires Transmission-Based Precautions: No Has Dressing in Place as Prescribed: Yes Patient Has Alerts: Yes Pain Present Now: No Patient Alerts: DMII Electronic Signature(s) Signed: 05/25/2017 4:38:31 PM By: Montey Hora Entered By: Montey Hora on 05/25/2017 13:36:03 Adam Merritt ([id number]) -------------------------------------------------------------------------------- Encounter Discharge Information Details Patient Name: Adam Merritt Date of Service: 05/25/2017 1:30 PM Medical Record Number: [id number] Patient Account Number: [id number] Date of Birth/Sex: 08-Mar-1946 (71 y.o. Male) Treating RN: Montey Hora Primary Care Marcelus Dubberly: Adam Merritt Other Clinician: Referring Wayland Baik: Adam Merritt Treating Andilyn Bettcher/Extender: Melburn Hake, [name]: 8 Encounter Discharge Information Items Discharge Pain Level: 0 Discharge Condition: Stable Ambulatory Status: Cane Discharge Destination: Home Transportation: Private Auto Accompanied By:  spouse Schedule Follow-up Appointment: Yes Medication Reconciliation completed and No provided to Patient/Care Carrah Eppolito: Provided on Clinical Summary of Care: 05/25/2017 Form Type Recipient Paper Patient JT Electronic Signature(s) Signed: 05/25/2017 2:12:56 PM By: Montey Hora Entered By: Montey Hora on 05/25/2017 14:12:56 Adam Merritt ([id number]) -------------------------------------------------------------------------------- Lower Extremity Assessment Details Patient Name: Adam Merritt Date of Service: 05/25/2017 1:30 PM Medical Record Number: [id number] Patient Account Number: [id number] Date of Birth/Sex: 1946-03-07 (71 y.o. Male) Treating RN: Montey Hora Primary Care Wenda Vanschaick: Adam Merritt Other Clinician: Referring Bao Coreas: Adam Merritt Treating Sheela Mcculley/Extender: Melburn Hake, [name]: 8 Vascular Assessment Pulses: Dorsalis Pedis Palpable: [Left:Yes] Posterior Tibial Extremity colors, hair growth, and conditions: Extremity Color: [Left:Hyperpigmented] Hair Growth on Extremity: [Left:No] Temperature of Extremity: [Left:Warm] Capillary Refill: [Left:< 3 seconds] Electronic Signature(s) Signed: 05/25/2017 4:38:31 PM By: Montey Hora Entered By: Montey Hora on 05/25/2017 13:40:18 Adam Merritt ([id number]) -------------------------------------------------------------------------------- Multi Wound Chart Details Patient Name: Adam Merritt Date of Service: 05/25/2017 1:30 PM Medical Record Number: [id number] Patient Account Number: [id number] Date of Birth/Sex: 1946-04-23 (71 y.o. 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] [N/A:N/A] Comorbid History: [8:Anemia, Lymphedema, Chronic Obstructive Pulmonary Disease (COPD), Sleep Apnea, Congestive Heart Failure, Coronary Artery Disease, Hypertension, Peripheral Venous Disease, Type II Diabetes, Osteoarthritis, Neuropathy, Received  Chemotherapy] [N/A:N/A] Date Acquired: [8:04/16/2017] [N/A:N/A] Weeks of Treatment: [8:5] [N/A:N/A] Wound Status: [8:Open] [N/A:N/A] Measurements L x W x D [8:0.7x1.5x0.1] [N/A:N/A] (cm) Area (cm) : [8:0.825] [N/A:N/A] Volume (cm) : [8:0.082] [N/A:N/A] % Reduction in Area: [8:85.40%] [N/A:N/A] % Reduction in Volume: [8:85.50%] [N/A:N/A] Classification: [8:Category/Stage III] [N/A:N/A] Exudate Amount: [8:Large] [N/A:N/A] Exudate Type: [8:Serous] [N/A:N/A] Exudate  Color: [8:amber] [N/A:N/A] Wound Margin: [8:Flat and Intact] [N/A:N/A] Granulation Amount: [8:Large (67-100%)] [N/A:N/A] Granulation Quality: [8:Red] [N/A:N/A] Necrotic Amount: [8:Small (1-33%)] [N/A:N/A] Necrotic Tissue: [8:Eschar, Adherent Slough] [N/A:N/A] Exposed Structures: [8:Fat Layer (Subcutaneous Tissue) Exposed: Yes Fascia: No] [N/A:N/A] Tendon: No Muscle: No Joint: No Bone: No Epithelialization: Small (1-33%) N/A N/A Periwound Skin Texture: Excoriation: No N/A N/A Induration: No Callus: No Crepitus: No Rash: No Scarring: No Periwound Skin Moisture: Maceration: No N/A N/A Dry/Scaly: No Periwound Skin Color: Atrophie Blanche: No N/A N/A Cyanosis: No Ecchymosis: No Erythema: No Hemosiderin Staining: No Mottled: No Pallor: No Rubor: No Temperature: No Abnormality N/A N/A Tenderness on Palpation: Yes N/A N/A Wound Preparation: Ulcer Cleansing: N/A N/A Rinsed/Irrigated with  Saline Topical Anesthetic Applied: Other: lidocaine 4% Treatment Notes Electronic Signature(s) Signed: 05/25/2017 4:38:31 PM By: Montey Hora Entered By: Montey Hora on 05/25/2017 13:46:00 Adam Merritt ([id number]) -------------------------------------------------------------------------------- [address] Details Patient Name: Adam Merritt Date of Service: 05/25/2017 1:30 PM Medical Record Number: [id number] Patient Account Number: [id number] Date of Birth/Sex: 1945/08/23 (71 y.o. Male) Treating RN: Montey Hora Primary Care Veena Sturgess: Adam Merritt Other Clinician: Referring Ellias Mcelreath: Adam Merritt Treating Margi Edmundson/Extender: Melburn Hake, [name]: 8 Active Inactive Electronic Signature(s) Signed: 05/25/2017 4:38:31 PM By: Montey Hora Entered By: Montey Hora on 05/25/2017 13:45:47 Adam Merritt ([id number]) -------------------------------------------------------------------------------- Pain Assessment Details Patient Name: Adam Merritt Date of Service: 05/25/2017 1:30 PM Medical Record Number: [id number] Patient Account Number: [id number] Date of Birth/Sex: 1945-08-02 (71 y.o. Male) Treating RN: Montey Hora Primary Care Zerenity Bowron: Adam Merritt Other Clinician: Referring Brycin Kille: Adam Merritt Treating Sura Canul/Extender: Melburn Hake, [name]: 8 Active Problems Location of Pain Severity and Description of Pain Patient Has Paino No Site Locations Pain Management and Medication Current Pain Management: Notes Topical or injectable lidocaine is offered to patient for acute pain when surgical debridement is performed. If needed, Patient is instructed to use over the counter pain medication for the following 24-48 hours after debridement. Wound care MDs do not prescribed pain medications. Patient has chronic pain or uncontrolled pain. Patient has been instructed to make an appointment with their Primary Care Physician  for pain management. Electronic Signature(s) Signed: 05/25/2017 4:38:31 PM By: Montey Hora Entered By: Montey Hora on 05/25/2017 13:36:09 Adam Merritt ([id number]) -------------------------------------------------------------------------------- Patient/Caregiver Education Details Patient Name: Adam Merritt Date of Service: 05/25/2017 1:30 PM Medical Record Number: [id number] Patient Account Number: [id number] Date of Birth/Gender: [birth year] (71 y.o. Male) Treating RN: Montey Hora Primary Care Physician: Adam Merritt Other Clinician: Referring Physician: Clayborn Merritt Treating Physician/Extender: Sharalyn Ink in Treatment: 8 Education Assessment Education Provided To: Patient and Caregiver Education Topics Provided Wound/Skin Impairment: Handouts: Other: wound care as ordered Methods: Demonstration, Explain/Verbal Responses: State content correctly Electronic Signature(s) Signed: 05/25/2017 4:38:31 PM By: Montey Hora Entered By: Montey Hora on 05/25/2017 14:13:10 Adam Merritt ([id number]) -------------------------------------------------------------------------------- Wound Assessment Details Patient Name: Adam Merritt Date of Service: 05/25/2017 1:30 PM Medical Record Number: [id number] Patient Account Number: [id number] Date of Birth/Sex: 11/10/1945 (71 y.o. Male) Treating RN: Montey Hora Primary Care Seng Fouts: Adam Merritt Other Clinician: Referring Salam Micucci: Adam Merritt Treating Dymond Gutt/Extender: Melburn Hake, [name]: 8 Wound Status Wound Number: 8 Primary Pressure Ulcer Etiology: Wound Location: Left Calcaneus Secondary Diabetic Wound/Ulcer of the Lower Extremity Wounding Event: Pressure Injury Etiology: Date Acquired: 04/16/2017 Wound Open Weeks Of Treatment: 5 Status: Clustered Wound: No Comorbid Anemia, Lymphedema, Chronic Obstructive History: Pulmonary Disease (COPD), Sleep Apnea, Congestive Heart Failure,  Coronary Artery Disease, Hypertension, Peripheral Venous Disease, Type II Diabetes, Osteoarthritis, Neuropathy, Received Chemotherapy Photos Photo Uploaded By: Montey Hora on 05/25/2017 14:30:55 Wound Measurements Length: (cm) 0.7 Width: (cm) 1.5 Depth: (cm) 0.1 Area: (cm) 0.825 Volume: (cm) 0.082 % Reduction in Area: 85.4% % Reduction in Volume: 85.5% Epithelialization: Small (1-33%) Tunneling: No Undermining: No Wound Description Classification: Category/Stage III Wound Margin: Flat and Intact Exudate Amount: Large Exudate Type: Serous Exudate Color: amber Foul Odor After Cleansing: No Slough/Fibrino Yes Wound Bed Granulation Amount: Large (67-100%) Exposed Structure Granulation Quality: Red Fascia Exposed: No Necrotic Amount: Small (1-33%) Fat Layer (Subcutaneous Tissue) Exposed: Yes Necrotic Quality: Eschar, Adherent Slough Tendon Exposed: No SAKETH, DAUBERT ([id number]) Muscle Exposed: No Joint Exposed: No Bone Exposed: No Periwound Skin Texture Texture Color No Abnormalities Noted: No No Abnormalities Noted: No Callus: No Atrophie Blanche: No Crepitus: No Cyanosis: No Excoriation: No Ecchymosis: No Induration: No Erythema: No Rash: No Hemosiderin Staining: No Scarring: No Mottled: No Pallor: No Moisture Rubor: No No Abnormalities Noted: No Dry / Scaly: No Temperature / Pain Maceration: No Temperature: No Abnormality Tenderness on Palpation: Yes Wound Preparation Ulcer Cleansing: Rinsed/Irrigated with Saline Topical Anesthetic Applied: Other: lidocaine 4%, Treatment Notes Wound #8 (Left Calcaneus) 1. Cleansed with: Clean wound with Normal Saline 2. Anesthetic Topical Lidocaine 4% cream to wound bed prior to debridement 4. Dressing Applied: Other dressing (specify in notes) 5. Secondary Dressing Applied Dry Gauze Foam Kerlix/Conform 7. Secured with Tape Notes silvercell, netting, heel cup Electronic Signature(s) Signed:  05/25/2017 4:38:31 PM By: Montey Hora Entered By: Montey Hora on 05/25/2017 13:40:04 Adam Merritt ([id number]) -------------------------------------------------------------------------------- [place] Details Patient Name: Adam Merritt Date of Service: 05/25/2017 1:30 PM Medical Record Number: [id number] Patient Account Number: [id number] Date of Birth/Sex: [birth year] (71 y.o. Male) Treating RN: Montey Hora Primary Care Evelena Masci: Adam Merritt Other Clinician: Referring Shakyia Bosso: Adam Merritt Treating Caitlin Hillmer/Extender: Melburn Hake, [name]: 8 Vital Signs Time Taken: 13:34 Temperature (F): 98.3 Height (in): 64 Pulse (bpm): 79 Weight (lbs): 298 Respiratory Rate (breaths/min): 20 Body Mass Index (BMI): 51.1 Blood Pressure (mmHg): 153/69 Reference Range: 80 - 120 mg / dl Electronic Signature(s) Signed: 05/25/2017 4:38:31 PM By: Montey Hora Entered By: Montey Hora on 05/25/2017 13:36:14

## 2017-06-04 ENCOUNTER — Encounter: Payer: Medicare Other | Admitting: Physician Assistant

## 2017-06-04 ENCOUNTER — Encounter: Payer: Medicare Other | Admitting: Occupational Therapy

## 2017-06-04 DIAGNOSIS — E11620 Type 2 diabetes mellitus with diabetic dermatitis: Secondary | ICD-10-CM | POA: Diagnosis not present

## 2017-06-06 NOTE — Progress Notes (Signed)
Adam Merritt, Adam Merritt ([id number]) Visit Report for 06/04/2017 Arrival Information Details Patient Name: Adam Merritt Date of Service: 06/04/2017 2:30 PM Medical Record Number: [id number] Patient Account Number: [id number] Date of Birth/Sex: [birth year] (71 y.o. Male) Treating RN: Montey Hora Primary Care Sharran Caratachea: [name] Other Clinician: Referring Alanah Sakuma: [name] Treating Raquan Iannone/Extender: Melburn Hake, [name]: 10 Visit Information History Since Last Visit Added or deleted any medications: No Patient Arrived: Cane Any new allergies or adverse reactions: No Arrival Time: 13:09 Had a fall or experienced change in No Accompanied By: spouse activities of daily living that may affect Transfer Assistance: None risk of falls: Patient Identification Verified: Yes Signs or symptoms of abuse/neglect since last visito No Secondary Verification Process Completed: Yes Hospitalized since last visit: No Patient Requires Transmission-Based Precautions: No Has Dressing in Place as Prescribed: Yes Patient Has Alerts: Yes Pain Present Now: No Patient Alerts: DMII Electronic Signature(s) Signed: 06/04/2017 4:34:32 PM By: Montey Hora Entered By: Montey Hora on 06/04/2017 13:09:55 Adam Merritt ([id number]) -------------------------------------------------------------------------------- Clinic Level of Care Assessment Details Patient Name: Adam Merritt Date of Service: 06/04/2017 2:30 PM Medical Record Number: [id number] Patient Account Number: [id number] Date of Birth/Sex: 08-14-1945 (72 y.o. Male) Treating RN: Montey Hora Primary Care Teneshia Hedeen: [name] Other Clinician: Referring Jamariah Tony: [name] Treating Keston Seever/Extender: Melburn Hake, [name]: 10 Clinic Level of Care Assessment Items TOOL 4 Quantity Score []  - Use when only an EandM is performed on FOLLOW-UP visit 0 ASSESSMENTS - Nursing Assessment / Reassessment X - Reassessment  of Co-morbidities (includes updates in patient status) 1 10 X- 1 5 Reassessment of Adherence to Treatment Plan ASSESSMENTS - Wound and Skin Assessment / Reassessment X - Simple Wound Assessment / Reassessment - one wound 1 5 []  - 0 Complex Wound Assessment / Reassessment - multiple wounds []  - 0 Dermatologic / Skin Assessment (not related to wound area) ASSESSMENTS - Focused Assessment []  - Circumferential Edema Measurements - multi extremities 0 []  - 0 Nutritional Assessment / Counseling / Intervention X- 1 5 Lower Extremity Assessment (monofilament, tuning fork, pulses) []  - 0 Peripheral Arterial Disease Assessment (using hand held doppler) ASSESSMENTS - Ostomy and/or Continence Assessment and Care []  - Incontinence Assessment and Management 0 []  - 0 Ostomy Care Assessment and Management (repouching, etc.) PROCESS - Coordination of Care X - Simple Patient / Family Education for ongoing care 1 15 []  - 0 Complex (extensive) Patient / Family Education for ongoing care []  - 0 Staff obtains Programmer, systems, Records, Test Results / Process Orders []  - 0 Staff telephones HHA, Nursing Homes / Clarify orders / etc []  - 0 Routine Transfer to another Facility (non-emergent condition) []  - 0 Routine Hospital Admission (non-emergent condition) []  - 0 New Admissions / Biomedical engineer / Ordering NPWT, Apligraf, etc. []  - 0 Emergency Hospital Admission (emergent condition) X- 1 10 Simple Discharge Coordination Adam Merritt, Adam Merritt ([id number]) []  - 0 Complex (extensive) Discharge Coordination PROCESS - Special Needs []  - Pediatric / Minor Patient Management 0 []  - 0 Isolation Patient Management []  - 0 Hearing / Language / Visual special needs []  - 0 Assessment of Community assistance (transportation, D/C planning, etc.) []  - 0 Additional assistance / Altered mentation []  - 0 Support Surface(s) Assessment (bed, cushion, seat, etc.) INTERVENTIONS - Wound Cleansing / Measurement X -  Simple Wound Cleansing - one wound 1 5 []  - 0 Complex Wound Cleansing - multiple wounds X- 1 5 Wound Imaging (photographs - any number of wounds) []  -  0 Wound Tracing (instead of photographs) X- 1 5 Simple Wound Measurement - one wound []  - 0 Complex Wound Measurement - multiple wounds INTERVENTIONS - Wound Dressings X - Small Wound Dressing one or multiple wounds 1 10 []  - 0 Medium Wound Dressing one or multiple wounds []  - 0 Large Wound Dressing one or multiple wounds []  - 0 Application of Medications - topical []  - 0 Application of Medications - injection INTERVENTIONS - Miscellaneous []  - External ear exam 0 []  - 0 Specimen Collection (cultures, biopsies, blood, body fluids, etc.) []  - 0 Specimen(s) / Culture(s) sent or taken to Lab for analysis []  - 0 Patient Transfer (multiple staff / Civil Service fast streamer / Similar devices) []  - 0 Simple Staple / Suture removal (25 or less) []  - 0 Complex Staple / Suture removal (26 or more) []  - 0 Hypo / Hyperglycemic Management (close monitor of Blood Glucose) []  - 0 Ankle / Brachial Index (ABI) - do not check if billed separately X- 1 5 Vital Signs Adam Merritt, Adam Merritt ([id number]) Has the patient been seen at the hospital within the last three years: Yes Total Score: 80 Level Of Care: New/Established - Level 3 Electronic Signature(s) Signed: 06/04/2017 4:34:32 PM By: Montey Hora Entered By: Montey Hora on 06/04/2017 15:52:56 Adam Merritt ([id number]) -------------------------------------------------------------------------------- Encounter Discharge Information Details Patient Name: Adam Merritt Date of Service: 06/04/2017 2:30 PM Medical Record Number: [id number] Patient Account Number: [id number] Date of Birth/Sex: 05/26/1946 (72 y.o. Male) Treating RN: Montey Hora Primary Care Makynlie Rossini: [name] Other Clinician: Referring Ladina Shutters: [name] Treating Maddex Garlitz/Extender: Melburn Hake, [name]:  10 Encounter Discharge Information Items Discharge Pain Level: 0 Discharge Condition: Stable Ambulatory Status: Cane Discharge Destination: Home Transportation: Private Auto Accompanied By: spouse Schedule Follow-up Appointment: Yes Medication Reconciliation completed and No provided to Patient/Care Carolann Brazell: Provided on Clinical Summary of Care: 06/04/2017 Form Type Recipient Paper Patient JT Electronic Signature(s) Signed: 06/04/2017 3:55:08 PM By: Montey Hora Entered By: Montey Hora on 06/04/2017 15:55:08 Adam Merritt ([id number]) -------------------------------------------------------------------------------- Lower Extremity Assessment Details Patient Name: Adam Merritt Date of Service: 06/04/2017 2:30 PM Medical Record Number: [id number] Patient Account Number: [id number] Date of Birth/Sex: Apr 01, 1946 (72 y.o. Male) Treating RN: Montey Hora Primary Care Maxson Oddo: [name] Other Clinician: Referring Cornella Emmer: [name] Treating Lorann Tani/Extender: Melburn Hake, [name]: 10 Vascular Assessment Pulses: Dorsalis Pedis Palpable: [Left:Yes] Posterior Tibial Extremity colors, hair growth, and conditions: Extremity Color: [Left:Hyperpigmented] Hair Growth on Extremity: [Left:No] Temperature of Extremity: [Left:Warm] Capillary Refill: [Left:< 3 seconds] Toe Nail Assessment Left: Right: Thick: Yes Discolored: Yes Deformed: Yes Improper Length and Hygiene: No Electronic Signature(s) Signed: 06/04/2017 4:34:32 PM By: Montey Hora Entered By: Montey Hora on 06/04/2017 13:19:29 Adam Merritt ([id number]) -------------------------------------------------------------------------------- Multi Wound Chart Details Patient Name: Adam Merritt Date of Service: 06/04/2017 2:30 PM Medical Record Number: [id number] Patient Account Number: [id number] Date of Birth/Sex: [birth year] (72 y.o. 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] [N/A:N/A] Comorbid History: [8:Anemia, Lymphedema, Chronic Obstructive Pulmonary Disease (COPD), Sleep Apnea, Congestive Heart Failure, Coronary Artery Disease, Hypertension, Peripheral Venous Disease, Type II Diabetes,  Osteoarthritis, Neuropathy, Received  Chemotherapy] [N/A:N/A] Date Acquired: [8:04/16/2017] [N/A:N/A] Weeks of Treatment: [8:6] [N/A:N/A] Wound Status: [8:Open] [N/A:N/A] Measurements L x W x D [8:0.5x1.2x0.1] [N/A:N/A] (cm) Area (cm) : [8:0.471] [N/A:N/A] Volume (cm) : [8:0.047] [N/A:N/A] % Reduction in Area: [8:91.70%] [N/A:N/A] % Reduction in Volume: [8:91.70%] [N/A:N/A] Classification: [8:Category/Stage III] [N/A:N/A] Exudate Amount: [8:Large] [N/A:N/A] Exudate Type: [8:Serous] [N/A:N/A] Exudate Color: [8:amber] [N/A:N/A] Wound Margin: [8:Flat and Intact] [N/A:N/A] Granulation Amount: [8:Large (67-100%)] [N/A:N/A] Granulation Quality: [8:Red] [N/A:N/A] Necrotic Amount: [8:Small (1-33%)] [N/A:N/A] Necrotic Tissue: [8:Eschar, Adherent Slough] [N/A:N/A] Exposed Structures: [8:Fat Layer (Subcutaneous Tissue) Exposed: Yes Fascia: No] [N/A:N/A] Tendon: No Muscle: No Joint: No Bone: No Epithelialization: Medium (34-66%) N/A N/A Periwound Skin Texture: Excoriation: No N/A N/A Induration: No Callus: No Crepitus: No Rash: No Scarring: No Periwound Skin Moisture: Maceration: No N/A N/A Dry/Scaly: No Periwound  Skin Color: Atrophie Blanche: No N/A N/A Cyanosis: No Ecchymosis: No Erythema: No Hemosiderin Staining: No Mottled: No Pallor: No Rubor: No Temperature: No Abnormality N/A N/A Tenderness on Palpation: Yes N/A N/A Wound Preparation: Ulcer Cleansing: N/A N/A Rinsed/Irrigated with Saline Topical Anesthetic Applied: Other: lidocaine 4% Treatment Notes Electronic Signature(s) Signed: 06/04/2017 4:34:32 PM By: Montey Hora Entered By: Montey Hora on 06/04/2017 13:56:23 Adam Merritt ([id number]) -------------------------------------------------------------------------------- [place] Details Patient Name: Adam Merritt Date of Service: 06/04/2017 2:30 PM Medical Record Number: [id number] Patient Account Number: [id number] Date of Birth/Sex: November 14, 1945 (72 y.o. Male) Treating RN: Montey Hora Primary Care Trana Ressler: [name] Other Clinician: Referring Linton Stolp: [name] Treating Nachmen Mansel/Extender: Melburn Hake, [name]: 10 Active Inactive Electronic Signature(s) Signed: 06/04/2017 4:34:32 PM By: Montey Hora Entered By: Montey Hora on 06/04/2017 13:56:16 Adam Merritt ([id number]) -------------------------------------------------------------------------------- Pain Assessment Details Patient Name: Adam Merritt Date of Service: 06/04/2017 2:30 PM Medical Record Number: [id number] Patient Account Number: [id number] Date of Birth/Sex: 1946-01-24 (72 y.o. Male) Treating RN: Montey Hora Primary Care Delia Sitar: [name] Other Clinician: Referring Makailee Nudelman: [name] Treating Karson Reede/Extender: Melburn Hake, [name]: 10 Active Problems Location of Pain Severity and Description of Pain Patient Has Paino No Site Locations Pain Management and Medication Current Pain Management: Notes Topical or injectable lidocaine is offered to patient for acute pain when surgical debridement is performed. If needed,  Patient is instructed to use over the counter pain medication for the following 24-48 hours after debridement. Wound care MDs do not prescribed pain medications. Patient has chronic pain or uncontrolled pain. Patient has been instructed to make an appointment with their Primary Care Physician for pain management. Electronic Signature(s) Signed: 06/04/2017 4:34:32 PM By: Montey Hora Entered By: Montey Hora on 06/04/2017 13:10:12 Adam Merritt ([id number]) -------------------------------------------------------------------------------- Patient/Caregiver Education Details Patient Name: Adam Merritt Date of Service: 06/04/2017 2:30 PM Medical Record Number: [id number] Patient Account Number: [id number] Date of Birth/Gender: 06-28-1945 (72 y.o. Male) Treating RN: Montey Hora Primary Care Physician: [name] Other Clinician: Referring Physician: [name] Treating Physician/Extender: Sharalyn Ink in Treatment: 10 Education Assessment Education Provided To: Patient Education Topics Provided Wound/Skin Impairment: Handouts: Other: wound care as ordered Methods: Demonstration, Explain/Verbal Responses: State content correctly Electronic Signature(s) Signed: 06/04/2017 4:34:32 PM By: Montey Hora Entered By: Montey Hora on 06/04/2017 15:55:25 Adam Merritt ([id number]) -------------------------------------------------------------------------------- Wound Assessment Details Patient Name: Adam Merritt Date of Service: 06/04/2017 2:30 PM Medical Record Number: [id number] Patient Account Number: [id number] Date of Birth/Sex: [birth year] (72 y.o. Male) Treating RN: Montey Hora Primary Care Kyren Vaux: [name] Other Clinician: Referring Maressa Apollo: [name] Treating  Latrelle Bazar/Extender: Melburn Hake, [name]: 10 Wound Status Wound Number: 8 Primary Pressure Ulcer Etiology: Wound Location: Left Calcaneus Secondary Diabetic Wound/Ulcer of  the Lower Extremity Wounding Event: Pressure Injury Etiology: Date Acquired: 04/16/2017 Wound Open Weeks Of Treatment: 6 Status: Clustered Wound: No Comorbid Anemia, Lymphedema, Chronic Obstructive History: Pulmonary Disease (COPD), Sleep Apnea, Congestive Heart Failure, Coronary Artery Disease, Hypertension, Peripheral Venous Disease, Type II Diabetes, Osteoarthritis, Neuropathy, Received Chemotherapy Photos Photo Uploaded By: Montey Hora on 06/04/2017 16:33:37 Wound Measurements Length: (cm) 0.5 Width: (cm) 1.2 Depth: (cm) 0.1 Area: (cm) 0.471 Volume: (cm) 0.047 % Reduction in Area: 91.7% % Reduction in Volume: 91.7% Epithelialization: Medium (34-66%) Tunneling: No Undermining: No Wound Description Classification: Category/Stage III Wound Margin: Flat and Intact Exudate Amount: Large Exudate Type: Serous Exudate Color: amber Foul Odor After Cleansing: No Slough/Fibrino Yes Wound Bed Granulation Amount: Large (67-100%) Exposed Structure Granulation Quality: Red Fascia Exposed: No Necrotic Amount: Small (1-33%) Fat Layer (Subcutaneous Tissue) Exposed: Yes Necrotic Quality: Eschar, Adherent Slough Tendon Exposed: No Adam Merritt, Adam Merritt ([id number]) Muscle Exposed: No Joint Exposed: No Bone Exposed: No Periwound Skin Texture Texture Color No Abnormalities Noted: No No Abnormalities Noted: No Callus: No Atrophie Blanche: No Crepitus: No Cyanosis: No Excoriation: No Ecchymosis: No Induration: No Erythema: No Rash: No Hemosiderin Staining: No Scarring: No Mottled: No Pallor: No Moisture Rubor: No No Abnormalities Noted: No Dry / Scaly: No Temperature / Pain Maceration: No Temperature: No Abnormality Tenderness on Palpation: Yes Wound Preparation Ulcer Cleansing: Rinsed/Irrigated with Saline Topical Anesthetic Applied: Other: lidocaine 4%, Treatment Notes Wound #8 (Left Calcaneus) 1. Cleansed with: Clean wound with Normal Saline 2.  Anesthetic Topical Lidocaine 4% cream to wound bed prior to debridement 4. Dressing Applied: Other dressing (specify in notes) 5. Secondary Dressing Applied Dry Gauze Foam Kerlix/Conform 7. Secured with Tape Notes silvercell, netting, heel cup Electronic Signature(s) Signed: 06/04/2017 4:34:32 PM By: Montey Hora Entered By: Montey Hora on 06/04/2017 13:18:07 Adam Merritt ([id number]) -------------------------------------------------------------------------------- Interior Details Patient Name: Adam Merritt Date of Service: 06/04/2017 2:30 PM Medical Record Number: [id number] Patient Account Number: [id number] Date of Birth/Sex: May 07, 1946 (71 y.o. Male) Treating RN: Montey Hora Primary Care Shamari Lofquist: [name] Other Clinician: Referring Heidy Mccubbin: [name] Treating Lamyah Creed/Extender: Melburn Hake, [name]: 10 Vital Signs Time Taken: 13:10 Temperature (F): 97.8 Height (in): 64 Pulse (bpm): 69 Weight (lbs): 298 Respiratory Rate (breaths/min): 22 Body Mass Index (BMI): 51.1 Blood Pressure (mmHg): 159/71 Reference Range: 80 - 120 mg / dl Electronic Signature(s) Signed: 06/04/2017 4:34:32 PM By: Montey Hora Entered By: Montey Hora on 06/04/2017 13:13:36

## 2017-06-06 NOTE — Progress Notes (Signed)
[name], [name] ([id number]) Visit Report for 06/04/2017 Chief Complaint Document Details Patient Name: Adam Merritt, Adam Merritt Date of Service: 06/04/2017 2:30 PM Medical Record Number: [id number] Patient Account Number: [id number] Date of Birth/Sex: [birth year] (71 y.o. Male) Treating RN: Montey Hora Primary Care Provider: [name] Other Clinician: Referring Provider: [name] Treating Provider/Extender: Melburn Hake, [name]: 10 Information Obtained from: Patient Chief Complaint the patient was recently discharged and now is back for a pressure ulcer on his left heel Electronic Signature(s) Signed: 06/06/2017 11:24:19 AM By: Worthy Keeler PA-C Entered By: Worthy Keeler on 06/04/2017 13:54:10 Adam Merritt ([id number]) -------------------------------------------------------------------------------- HPI Details Patient Name: Adam Merritt Date of Service: 06/04/2017 2:30 PM Medical Record Number: [id number] Patient Account Number: [id number] Date of Birth/Sex: [birth year] (72 y.o. Male) Treating RN: Montey Hora Primary Care Provider: [name] Other Clinician: Referring Provider: [name] Treating Provider/Extender: Melburn Hake, [name]: 10 History of Present Illness Location: left lower extremity lymphedema with a small open wound which is fairly superficial on his left anterior calf Quality: Patient reports No Pain. Severity: Patient states wound are getting better Duration: his happened only last week Context: The wound appeared gradually over time Modifying Factors: he is being treated at the lymphedema clinic with regular wrapping of his left lower extremity Associated Signs and Symptoms: Patient reports having difficulty standing for long periods. HPI Description: 72 year old gentleman with chronic bilateral lower extremity lymphedema associated with diabetes mellitus and obesity has been very compliant with his management and has been  going to the lymphedema clinic in the recent past. He now returns with a small superficial ulcer on the left anterior lower calf about a week's duration He has been under the treatment of the lymphedema clinic for the last month. 04/20/2017 -- the patient was discharged a few weeks ago for lymphedema with ulceration and is under the care of the lymphedema clinic. However he had a fall about 2 weeks ago where he lost consciousness and was laying on the floor for about an hour. During this time he has sustained a pressure injury to the left heel. Of note he has had a vascular consult by Dr. Annamarie Major -- on 09/29/2013 and Dr. Trula Slade opinion was:"I have reviewed his outside ultrasound which shows an ankle-brachial index of 0.72 on the left and 1.0 on the right.o This study was repeated at our office.o The ankle-brachial index on the right was 1.26 with triphasic waveforms.o On the left it was a 1.04 with biphasic waveforms.o The patient had a toe pressure of 121 on the right and 100 on the left. In summary, I would not recommend any further imaging , as I do not think his symptoms are related to arterial insufficiency, " 06/04/17 patient's heel ulcer appears to be doing excellent today. He has been tolerating the dressing changes without complication. No fevers, chills, nausea, or vomiting noted at this time. I am very pleased with the progress he has made since my last evaluation with him. Patient is having no pain. ===== Old notes: 72 year old gentleman seen earlier at our clinic in June 2016 and again in June 2017 is noncompliant with recommendations and does not follow-up regularly. 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 He had referred him  to the lymphedema clinic and he was accepted  but he did not follow-up with them. At that stage he had agreed to get a repeat arterial study done and he has seen Dr. Trula Slade in [place]. This time around he has been seen by his PCP Dr. Tiburcio Bash who referred him urgently to [place] for bilateral lower extremity cellulitis in spite of being on antibiotics -- he was placed on Keflex and 500 mg 3 times a day for 10 days. His comorbidities include coronary artery disease, diabetes mellitus, hypertension, Bell's palsy and chronic lymphedema. he was also recently admitted to the hospital on April 13 and kept overnight with the diagnosis of a TIA and a left ICA stenosis to follow-up with vascular surgery as an outpatient 10/05/2016 -- patient has been compliant this week and left his compression and dressing on for the entire week and had home health change it appropriately. 11/13/2016 -- his insurance will not cover compression stockings for lymphedema and hence we will have to send him to the lymphedema clinic as soon as we have finished with his healing. 11/20/2016 -- his lymphedema clinic appointment is still pending but other than that he has done very well and we are ready Adam Merritt, NESBIT. ([id number]) to transition him over. 11/27/16 patient's wounds appeared to be completely healed on evaluation today. He still has bilateral left edema and has been referred to the lymphedema clinic although we are still waiting to hear back from them on scheduling. Apparently she has to be discharged from home health services prior to being able to establish with the lymphedema clinic. At this point considering he is completely healed from the standpoint of wounds of the bilateral lower extremities I think it is appropriate to discharge him from home health and initiate weekly wraps on our end. There is no evidence of infection. 01/01/2017 -- the patient was recently discharged on 12/04/2016 and asked to follow-up in the lymphedema clinic. However they seem to  be some issues with his going to the lymphedema clinic and then referring him to dermatology for an opinion regarding his skin. Ultimately the patient did not have a follow-up and has now redeveloped wheezing from the right lower extremity and ulceration on the left lower extremity. The patient and his wife are rather helpless and do not seem to be very compliant with their care. 01/08/17 on evaluation today patient has just a single lower extremity wounds noted. Fortunately he is having no significant pain, no bowe or bladder dysfunction, no nausea and no vomiting. There's nothing to indicate a systemic worsening infection. 01/16/17 on a violation today patient appears to be doing fairly well in regard to his bilateral lower extremities. He has a very small ulcer which is barely openable left lower extremity and otherwise is completely closed in regard to both legs. I believe we may be ready to get him back to the lymphedema clinic at this point. ==== Old Notes: 72 year old gentleman who comes with bilateral lower limb edema for over 20 years and recent attack of blisters and redness for about 2 weeks. The patient says he has had bilateral lower limb edema since 1996. He has been noncompliant with wearing wraps in the past and even was given a boot to wear at night which she never tolerated and didn't wear it. He has a past medical history of sleep apnea, neuropathy with diabetes, hypertension, hyperlipidemia, coronary artery disease, varicose veins, carotid artery disease and is status post CABG and cholecystectomy. He was  seen by the vascular surgeon on 09/29/2013 and Dr. Trula Slade opinion was:"I have reviewed his outside ultrasound which shows an ankle-brachial index of 0.72 on the left and 1.0 on the right.o This study was repeated at our office.o The ankle-brachial index on the right was 1.26 with triphasic waveforms.o On the left it was a 1.04 with biphasic waveforms.o The patient had a toe  pressure of 121 on the right and 100 on the left. In summary, I would not recommend any further imaging , as I do not think his symptoms are related to arterial insufficiency, but rather secondary to diabetic neuropathy.o The patient still has room for increasing his Neurontin dose, which he states does help him.o I have scheduled him to come back in one year for a repeat arterial evaluation." in June 2016, a year ago, he has a venous duplex study to be done. He was also going to be accepted to the lymphedema clinic but I did not believe the patient went regularly for this. ==== Electronic Signature(s) Signed: 06/06/2017 11:24:19 AM By: Worthy Keeler PA-C Entered By: Worthy Keeler on 06/04/2017 14:09:26 Adam Merritt ([id number]) -------------------------------------------------------------------------------- Physical Exam Details Patient Name: Adam Merritt Date of Service: 06/04/2017 2:30 PM Medical Record Number: [id number] Patient Account Number: [id number] Date of Birth/Sex: [birth year] (72 y.o. Male) Treating RN: Montey Hora Primary Care Provider: [name] Other Clinician: Referring Provider: [name] Treating Provider/Extender: Melburn Hake, [name]: 10 Constitutional Obese and well-hydrated in no acute distress. Respiratory normal breathing without difficulty. clear to auscultation bilaterally. Cardiovascular regular rate and rhythm with normal S1, S2. 1+ pitting edema of the bilateral lower extremities. Psychiatric this patient is able to make decisions and demonstrates good insight into disease process. Alert and Oriented x 3. pleasant and cooperative. Notes Patient has an excellent granular surface to the wound and there is no evidence of infection or Slough noted at this point. No debridement required. Electronic Signature(s) Signed: 06/06/2017 11:24:19 AM By: Worthy Keeler PA-C Entered By: Worthy Keeler on 06/04/2017 14:10:12 Adam Merritt  ([id number]) -------------------------------------------------------------------------------- Physician Orders Details Patient Name: Adam Merritt Date of Service: 06/04/2017 2:30 PM Medical Record Number: [id number] Patient Account Number: [id number] Date of Birth/Sex: 01-07-1946 (72 y.o. 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 o May Shower, gently pat wound dry prior to applying new dressing. Anesthetic (add to Medication List) Wound #8 Left Calcaneus o Topical Lidocaine 4% cream applied to wound bed prior to debridement (In Clinic Only). Primary Wound Dressing Wound #8 Left Calcaneus o Aquacel Ag Secondary Dressing Wound #8 Left Calcaneus o Dry Gauze o Conform/Kerlix - DO NOT WRAP TIGHT o Foam - Allevyn Heel Cup Dressing Change Frequency Wound #8 Left Calcaneus o Change dressing every other day. Follow-up Appointments o Return Appointment in 1 week. Edema Control Wound #8 Left Calcaneus o Patient to wear own compression stockings Additional Orders / Instructions Wound #8 Left Calcaneus o Increase protein intake. Adam Merritt, Adam Merritt ([id number]) Home Health Wound #8 Left Calcaneus   o [place] Visits - [facility] Nurse may visit PRN to address patientos wound care needs. o FACE TO FACE ENCOUNTER: MEDICARE and MEDICAID PATIENTS: I certify that this patient is  under my care and that I had a face-to-face encounter that meets the physician face-to-face encounter requirements with this patient on this date. The encounter with the patient was in whole or in part for the following MEDICAL CONDITION: (primary reason for [place]) MEDICAL NECESSITY: I certify, that based on my findings, NURSING services are a medically necessary home health service. HOME BOUND STATUS: I certify that my clinical findings support that this patient is homebound (i.e., Due to illness or injury, pt requires aid of supportive devices such as crutches, cane, wheelchairs, walkers, the use of special transportation or the assistance of another person to leave their place of residence. There is a normal inability to leave the home and doing so requires considerable and taxing effort. Other absences are for medical reasons / religious services and are infrequent or of short duration when for other reasons). o If current dressing causes regression in wound condition, may D/C ordered dressing product/s and apply Normal Saline Moist Dressing daily until next [place] / Other MD appointment. [place] of regression in wound condition at [phone]. o Please direct any NON-WOUND related issues/requests for orders to patient's Primary Care Physician Electronic Signature(s) Signed: 06/04/2017 4:34:32 PM By: Montey Hora Signed: 06/06/2017 11:24:19 AM By: Worthy Keeler PA-C Entered By: Montey Hora on 06/04/2017 14:06:27 Adam Merritt ([id number]) -------------------------------------------------------------------------------- Problem List Details Patient Name: Adam Merritt Date of Service: 06/04/2017 2:30 PM Medical Record Number: [id number] Patient Account Number: [id number] Date of Birth/Sex: [birth year] (72 y.o. 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 03/26/2017 Yes I89.0 Lymphedema, not elsewhere classified 03/26/2017 Yes E66.01 Morbid (severe) obesity due to excess calories 03/26/2017 Yes L97.221 Non-pressure chronic ulcer of left calf limited to breakdown of skin 03/26/2017 Yes L89.623 Pressure ulcer of left heel, stage 3 04/20/2017 Yes Inactive Problems Resolved Problems Electronic Signature(s) Signed: 06/06/2017 11:24:19 AM By: Worthy Keeler PA-C Entered By: Worthy Keeler on 06/04/2017 13:54:02 Adam Merritt ([id number]) -------------------------------------------------------------------------------- Progress Note Details Patient Name: Adam Merritt Date of Service: 06/04/2017 2:30 PM Medical Record Number: [id number] Patient Account Number: [id number] Date of Birth/Sex: 13-Jul-1945 (72 y.o. Male) Treating RN: Montey Hora Primary Care Provider: [name] Other Clinician: Referring Provider: [name] Treating Provider/Extender: Melburn Hake, [name]: 10 Subjective Chief Complaint Information obtained from Patient the patient was recently discharged and now is back for a pressure ulcer on his left heel History of Present Illness (HPI) The following HPI elements were documented for the patient's wound: Location: left lower extremity lymphedema with a small open wound which is fairly superficial on his left anterior calf Quality: Patient reports No Pain. Severity: Patient states wound are getting better Duration: his happened only last week Context: The wound appeared gradually over time Modifying Factors: he is being treated at the lymphedema clinic with regular wrapping of his left lower extremity Associated Signs and Symptoms: Patient reports having difficulty standing for long  periods. 72 year old gentleman with chronic bilateral lower extremity lymphedema associated with diabetes mellitus and obesity has been very compliant with his management  and has been going to the lymphedema clinic in the recent past. He now returns with a small superficial ulcer on the left anterior lower calf about a week's duration He has been under the treatment of the lymphedema clinic for the last month. 04/20/2017 -- the patient was discharged a few weeks ago for lymphedema with ulceration and is under the care of the lymphedema clinic. However he had a fall about 2 weeks ago where he lost consciousness and was laying on the floor for about an hour. During this time he has sustained a pressure injury to the left heel. Of note he has had a vascular consult by Dr. Annamarie Major -- on 09/29/2013 and Dr. Trula Slade opinion was:"I have reviewed his outside ultrasound which shows an ankle-brachial index of 0.72 on the left and 1.0 on the right. This study was repeated at our office. The ankle-brachial index on the right was 1.26 with triphasic waveforms. On the left it was a 1.04 with biphasic waveforms. The patient had a toe pressure of 121 on the right and 100 on the left. In summary, I would not recommend any further imaging , as I do not think his symptoms are related to arterial insufficiency, " 06/04/17 patient's heel ulcer appears to be doing excellent today. He has been tolerating the dressing changes without complication. No fevers, chills, nausea, or vomiting noted at this time. I am very pleased with the progress he has made since my last evaluation with him. Patient is having no pain. ===== Old notes: 72 year old gentleman seen earlier at our clinic in June 2016 and again in June 2017 is noncompliant with recommendations and does not follow-up regularly. 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 He had referred him to the lymphedema clinic and he was accepted but he did not follow-up with them. At that stage he had agreed to get a repeat arterial study done and he has seen Dr. Trula Slade in [place]. This time around he has been seen by his PCP Dr. Tiburcio Bash who referred him urgently to [place] for bilateral lower extremity cellulitis in spite of being on antibiotics -- he was placed on Keflex and 500 mg 3 times a day for 10 days. His comorbidities include coronary artery disease, diabetes mellitus, hypertension, Bell's palsy and chronic lymphedema. he was also recently admitted to the hospital on April 13 and kept overnight with the diagnosis of a TIA and a left ICA stenosis to follow-up with vascular surgery as an outpatient Adam Merritt, Adam Merritt ([id number]) 10/05/2016 -- patient has been compliant this week and left his compression and dressing on for the entire week and had home health change it appropriately. 11/13/2016 -- his insurance will not cover compression stockings for lymphedema and hence we will have to send him to the lymphedema clinic as soon as we have finished with his healing. 11/20/2016 -- his lymphedema clinic appointment is still pending but other than that he has done very well and we are ready to transition him over. 11/27/16 patient's wounds appeared to be completely healed on evaluation today. He still has bilateral left edema and has been referred to the lymphedema clinic although we are still waiting to hear back from them on scheduling. Apparently she has to be discharged from home  health services prior to being able to establish with the lymphedema clinic. At this point considering he is completely healed from the standpoint of wounds of the bilateral lower extremities I think it is appropriate to discharge him from home health and initiate weekly wraps  on our end. There is no evidence of infection. 01/01/2017 -- the patient was recently discharged on 12/04/2016 and asked to follow-up in the lymphedema clinic. However they seem to be some issues with his going to the lymphedema clinic and then referring him to dermatology for an opinion regarding his skin. Ultimately the patient did not have a follow-up and has now redeveloped wheezing from the right lower extremity and ulceration on the left lower extremity. The patient and his wife are rather helpless and do not seem to be very compliant with their care. 01/08/17 on evaluation today patient has just a single lower extremity wounds noted. Fortunately he is having no significant pain, no bowe or bladder dysfunction, no nausea and no vomiting. There's nothing to indicate a systemic worsening infection. 01/16/17 on a violation today patient appears to be doing fairly well in regard to his bilateral lower extremities. He has a very small ulcer which is barely openable left lower extremity and otherwise is completely closed in regard to both legs. I believe we may be ready to get him back to the lymphedema clinic at this point. ==== Old Notes: 72 year old gentleman who comes with bilateral lower limb edema for over 20 years and recent attack of blisters and redness for about 2 weeks. The patient says he has had bilateral lower limb edema since 1996. He has been noncompliant with wearing wraps in the past and even was given a boot to wear at night which she never tolerated and didn't wear it. He has a past medical history of sleep apnea, neuropathy with diabetes, hypertension, hyperlipidemia, coronary artery disease, varicose veins, carotid artery disease and is status post CABG and cholecystectomy. He was seen by the vascular surgeon on 09/29/2013 and Dr. Trula Slade opinion was:"I have reviewed his outside ultrasound which shows an ankle-brachial index of 0.72 on the left and 1.0 on the right. This study  was repeated at our office. The ankle-brachial index on the right was 1.26 with triphasic waveforms. On the left it was a 1.04 with biphasic waveforms. The patient had a toe pressure of 121 on the right and 100 on the left. In summary, I would not recommend any further imaging , as I do not think his symptoms are related to arterial insufficiency, but rather secondary to diabetic neuropathy. The patient still has room for increasing his Neurontin dose, which he states does help him. I have scheduled him to come back in one year for a repeat arterial evaluation." in June 2016, a year ago, he has a venous duplex study to be done. He was also going to be accepted to the lymphedema clinic but I did not believe the patient went regularly for this. ==== Patient History Information obtained from Patient. Family History Cancer - Paternal Grandparents, Kidney Disease - Siblings, Lung Disease - Mother, No family history of Diabetes, Heart Disease, Hereditary Spherocytosis, Hypertension, Seizures, Stroke, Thyroid Problems, Tuberculosis. Social History Never smoker, Marital Status - Married, Alcohol Use - Never, Drug Use - No History, Caffeine Use - Never. Adam Merritt, Adam Merritt ([id number]) Medical History Hospitalization/Surgery History - 11/03/2013, [facility], bronchitis. Medical And Surgical History Notes Cardiovascular varicose veins Neurologic CVA - 1992 Review of Systems (ROS) Constitutional Symptoms (General Health) Denies  complaints or symptoms of Fever, Chills. Respiratory The patient has no complaints or symptoms. Cardiovascular Complains or has symptoms of LE edema. Psychiatric The patient has no complaints or symptoms. Objective Constitutional Obese and well-hydrated in no acute distress. Vitals Time Taken: 1:10 PM, Height: 64 in, Weight: 298 lbs, BMI: 51.1, Temperature: 97.8 F, Pulse: 69 bpm, Respiratory Rate: 22 breaths/min, Blood Pressure: 159/71 mmHg. Respiratory normal breathing  without difficulty. clear to auscultation bilaterally. Cardiovascular regular rate and rhythm with normal S1, S2. 1+ pitting edema of the bilateral lower extremities. Psychiatric this patient is able to make decisions and demonstrates good insight into disease process. Alert and Oriented x 3. pleasant and cooperative. General Notes: Patient has an excellent granular surface to the wound and there is no evidence of infection or Slough noted at this point. No debridement required. Integumentary (Hair, Skin) Wound #8 status is Open. Original cause of wound was Pressure Injury. The wound is located on the Left Calcaneus. The wound measures 0.5cm length x 1.2cm width x 0.1cm depth; 0.471cm^2 area and 0.047cm^3 volume. There is Fat Layer (Subcutaneous Tissue) Exposed exposed. There is no tunneling or undermining noted. There is a large amount of serous drainage noted. The wound margin is flat and intact. There is large (67-100%) red granulation within the wound bed. There is a small (1-33%) amount of necrotic tissue within the wound bed including Eschar and Adherent Slough. The periwound skin appearance did not exhibit: Callus, Crepitus, Excoriation, Induration, Rash, Scarring, Dry/Scaly, Maceration, Adam Merritt, Adam Merritt. ([id number]) Blanche, Cyanosis, Ecchymosis, Hemosiderin Staining, Mottled, Pallor, Rubor, Erythema. Periwound temperature was noted as No Abnormality. The periwound has tenderness on palpation. 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 May Shower, gently pat wound dry prior to applying new dressing. Anesthetic (add to  Medication List): Wound #8 Left Calcaneus: Topical Lidocaine 4% cream applied to wound bed prior to debridement (In Clinic Only). Primary Wound Dressing: Wound #8 Left Calcaneus: Aquacel Ag Secondary Dressing: Wound #8 Left Calcaneus: Dry Gauze Conform/Kerlix - DO NOT WRAP TIGHT Foam - Allevyn Heel Cup Dressing Change Frequency: Wound #8 Left Calcaneus: Change dressing every other day. Follow-up Appointments: Return Appointment in 1 week. Edema Control: Wound #8 Left Calcaneus: Patient to wear own compression stockings Additional Orders / Instructions: Wound #8 Left Calcaneus: Increase protein intake. Home Health: Wound #8 Left Calcaneus: Continue Home Health Visits - [place] Nurse may visit PRN to address patient s wound care needs. FACE TO FACE ENCOUNTER: MEDICARE and MEDICAID PATIENTS: I certify that this patient is under my care and that I had a face-to-face encounter that meets the physician face-to-face encounter requirements with this patient on this date. The encounter with the patient was in whole or in part for the following MEDICAL CONDITION: (primary reason for [place]) MEDICAL NECESSITY: I certify, that based on my findings, NURSING services are a medically necessary home Adam Merritt, Adam Merritt ([id number]) health service. HOME BOUND STATUS: I certify that my clinical findings support that this patient is homebound (i.e., Due to illness or injury, pt requires aid of supportive devices such as crutches, cane, wheelchairs, walkers, the use of special transportation or the assistance of another person to  leave their place of residence. There is a normal inability to leave the home and doing so requires considerable and taxing effort. Other absences are for medical reasons / religious services and are infrequent or of short duration when for other reasons). If current dressing causes regression in wound condition, may D/C ordered dressing product/s and apply  Normal Saline Moist Dressing daily until next [place] / Other MD appointment. [place] of regression in wound condition at [phone]. Please direct any NON-WOUND related issues/requests for orders to patient's Primary Care Physician I am going to recommend that we continue with the Current wound care measures as patient appears to be progressing very nicely. He is in agreement with plan. Please see above for specific wound care orders. We will see patient for re-evaluation in 2 week(s) here in the clinic. If anything worsens or changes patient will contact our office for additional recommendations. Electronic Signature(s) Signed: 06/06/2017 11:24:19 AM By: Worthy Keeler PA-C Entered By: Worthy Keeler on 06/04/2017 14:10:35 Adam Merritt ([id number]) -------------------------------------------------------------------------------- ROS/PFSH Details Patient Name: Adam Merritt Date of Service: 06/04/2017 2:30 PM Medical Record Number: [id number] Patient Account Number: [id number] Date of Birth/Sex: [birth year] (72 y.o. Male) Treating RN: Montey Hora Primary Care Provider: [name] Other Clinician: Referring Provider: [name] Treating Provider/Extender: Melburn Hake, [name]: 10 Information Obtained From Patient Wound History Do you currently have one or more open woundso Yes How many open wounds do you currently haveo 1 Approximately how long have you had your woundso 10 days How have you been treating your wound(s) until nowo dry dressing Has your wound(s) ever healed and then re-openedo No Have you had any lab work done in the past montho No Have you tested positive for an antibiotic resistant organism (MRSA, VRE)o No Have you tested positive for osteomyelitis (bone infection)o No Have you had any tests for circulation on your legso Yes Where was the test doneo [facility] Constitutional Symptoms (General Health) Complaints and  Symptoms: Negative for: Fever; Chills Cardiovascular Complaints and Symptoms: Positive for: LE edema Medical History: Positive for: Congestive Heart Failure; Coronary Artery Disease; Hypertension; Peripheral Venous Disease Negative for: Angina; Arrhythmia; Deep Vein Thrombosis; Hypotension; Myocardial Infarction; Peripheral Arterial Disease; Phlebitis; Vasculitis Past Medical History Notes: varicose veins Eyes Medical History: Negative for: Cataracts; Glaucoma; Optic Neuritis Ear/Nose/Mouth/Throat Medical History: Negative for: Chronic sinus problems/congestion; Middle ear problems Hematologic/Lymphatic Medical History: Positive for: Anemia - aplastic anemia; Lymphedema Negative for: Hemophilia; Human Immunodeficiency Virus; Sickle Cell Disease Respiratory Adam Merritt, MOGLE. ([id number]) Complaints and Symptoms: No Complaints or Symptoms Medical History: Positive for: Chronic Obstructive Pulmonary Disease (COPD); Sleep Apnea Negative for: Aspiration; Asthma; Pneumothorax; Tuberculosis Gastrointestinal Medical History: Negative for: Cirrhosis ; Colitis; Crohnos; Hepatitis A; Hepatitis B; Hepatitis C Endocrine Medical History: Positive for: Type II Diabetes Negative for: Type I Diabetes Time with diabetes: since 2006 Treated with: Insulin Blood sugar tested every day: Yes Tested : Blood sugar testing results: Breakfast: 209 Genitourinary Medical History: Negative for: End Stage Renal Disease Immunological Medical History: Negative for: Lupus Erythematosus; Raynaudos; Scleroderma Integumentary (Skin) Medical History: Negative for: History of Burn; History of pressure wounds Musculoskeletal Medical History: Positive for: Osteoarthritis Negative for: Gout; Rheumatoid Arthritis; Osteomyelitis Neurologic Medical History: Positive for: Neuropathy Negative for: Dementia; Quadriplegia; Paraplegia; Seizure Disorder Past Medical History Notes: CVA - 1992 Oncologic Medical  History: Positive for: Received Chemotherapy - last dose 2006 Negative for: Received Radiation Adam Merritt, Adam Merritt ([id number]) Psychiatric Complaints and Symptoms: No  Complaints or Symptoms Medical History: Negative for: Anorexia/bulimia; Confinement Anxiety Immunizations Pneumococcal Vaccine: Received Pneumococcal Vaccination: Yes Implantable Devices Hospitalization / Surgery History Name of Hospital Purpose of Hospitalization/Surgery Date [facility] bronchitis 11/03/2013 Family and Social History Cancer: Yes - Paternal Grandparents; Diabetes: No; Heart Disease: No; Hereditary Spherocytosis: No; Hypertension: No; Kidney Disease: Yes - Siblings; Lung Disease: Yes - Mother; Seizures: No; Stroke: No; Thyroid Problems: No; Tuberculosis: No; Never smoker; Marital Status - Married; Alcohol Use: Never; Drug Use: No History; Caffeine Use: Never; Financial Concerns: No; Food, Clothing or Shelter Needs: No; Support System Lacking: No; Transportation Concerns: No; Advanced Directives: No; Patient does not want information on Advanced Directives Physician Affirmation I have reviewed and agree with the above information. Electronic Signature(s) Signed: 06/04/2017 4:34:32 PM By: Montey Hora Signed: 06/06/2017 11:24:19 AM By: Worthy Keeler PA-C Entered By: Worthy Keeler on 06/04/2017 14:09:52 Adam Merritt ([id number]) -------------------------------------------------------------------------------- SuperBill Details Patient Name: Adam Merritt Date of Service: 06/04/2017 Medical Record Number: [id number] Patient Account Number: [id number] Date of Birth/Sex: [birth year] (72 y.o. 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 I89.0 Lymphedema, not elsewhere  classified E66.01 Morbid (severe) obesity due to excess calories L97.221 Non-pressure chronic ulcer of left calf limited to breakdown of skin L89.623 Pressure ulcer of left heel, stage 3 Facility Procedures CPT4 Code: [id number] Description: 37902 - WOUND CARE VISIT-LEV 3 EST PT Modifier: Quantity: 1 Physician Procedures CPT4 Code: [id number] Description: 29924 - WC PHYS LEVEL 3 - EST PT ICD-10 Diagnosis Description 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

## 2017-06-07 ENCOUNTER — Encounter: Payer: Medicare Other | Admitting: Occupational Therapy

## 2017-06-18 ENCOUNTER — Encounter: Payer: Self-pay | Admitting: Internal Medicine

## 2017-06-18 ENCOUNTER — Encounter: Payer: Medicare Other | Attending: Physician Assistant | Admitting: Physician Assistant

## 2017-06-18 ENCOUNTER — Ambulatory Visit (INDEPENDENT_AMBULATORY_CARE_PROVIDER_SITE_OTHER): Payer: Medicare Other | Admitting: Internal Medicine

## 2017-06-18 VITALS — BP 138/72 | HR 82 | Resp 16 | Ht 66.0 in | Wt 304.2 lb

## 2017-06-18 DIAGNOSIS — Z794 Long term (current) use of insulin: Secondary | ICD-10-CM | POA: Diagnosis not present

## 2017-06-18 DIAGNOSIS — E1140 Type 2 diabetes mellitus with diabetic neuropathy, unspecified: Secondary | ICD-10-CM | POA: Insufficient documentation

## 2017-06-18 DIAGNOSIS — G4733 Obstructive sleep apnea (adult) (pediatric): Secondary | ICD-10-CM | POA: Diagnosis not present

## 2017-06-18 DIAGNOSIS — G4730 Sleep apnea, unspecified: Secondary | ICD-10-CM | POA: Insufficient documentation

## 2017-06-18 DIAGNOSIS — M1990 Unspecified osteoarthritis, unspecified site: Secondary | ICD-10-CM | POA: Insufficient documentation

## 2017-06-18 DIAGNOSIS — I110 Hypertensive heart disease with heart failure: Secondary | ICD-10-CM | POA: Diagnosis not present

## 2017-06-18 DIAGNOSIS — I890 Lymphedema, not elsewhere classified: Secondary | ICD-10-CM | POA: Diagnosis not present

## 2017-06-18 DIAGNOSIS — E11620 Type 2 diabetes mellitus with diabetic dermatitis: Secondary | ICD-10-CM | POA: Insufficient documentation

## 2017-06-18 DIAGNOSIS — Z8673 Personal history of transient ischemic attack (TIA), and cerebral infarction without residual deficits: Secondary | ICD-10-CM | POA: Insufficient documentation

## 2017-06-18 DIAGNOSIS — Z23 Encounter for immunization: Secondary | ICD-10-CM | POA: Diagnosis not present

## 2017-06-18 DIAGNOSIS — L97221 Non-pressure chronic ulcer of left calf limited to breakdown of skin: Secondary | ICD-10-CM | POA: Insufficient documentation

## 2017-06-18 DIAGNOSIS — I2510 Atherosclerotic heart disease of native coronary artery without angina pectoris: Secondary | ICD-10-CM | POA: Diagnosis not present

## 2017-06-18 DIAGNOSIS — L89623 Pressure ulcer of left heel, stage 3: Secondary | ICD-10-CM | POA: Diagnosis not present

## 2017-06-18 DIAGNOSIS — I509 Heart failure, unspecified: Secondary | ICD-10-CM | POA: Diagnosis not present

## 2017-06-18 DIAGNOSIS — E785 Hyperlipidemia, unspecified: Secondary | ICD-10-CM | POA: Diagnosis not present

## 2017-06-18 DIAGNOSIS — Z6841 Body Mass Index (BMI) 40.0 and over, adult: Secondary | ICD-10-CM | POA: Diagnosis not present

## 2017-06-18 DIAGNOSIS — Z951 Presence of aortocoronary bypass graft: Secondary | ICD-10-CM | POA: Insufficient documentation

## 2017-06-18 DIAGNOSIS — I5033 Acute on chronic diastolic (congestive) heart failure: Secondary | ICD-10-CM

## 2017-06-18 NOTE — Patient Instructions (Signed)
Sleep  Sleep Apnea Sleep apnea is a condition that affects breathing. People with sleep apnea have moments during sleep when their breathing pauses briefly or gets shallow. Sleep apnea can cause these symptoms:  Trouble staying asleep.  Sleepiness or tiredness during the day.  Irritability.  Loud snoring.  Morning headaches.  Trouble concentrating.  Forgetting things.  Less interest in sex.  Being sleepy for no reason.  Mood swings.  Personality changes.  Depression.  Waking up a lot during the night to pee (urinate).  Dry mouth.  Sore throat.  Follow these instructions at home:  Make any changes in your routine that your doctor recommends.  Eat a healthy, well-balanced diet.  Take over-the-counter and prescription medicines only as told by your doctor.  Avoid using alcohol, calming medicines (sedatives), and narcotic medicines.  Take steps to lose weight if you are overweight.  If you were given a machine (device) to use while you sleep, use it only as told by your doctor.  Do not use any tobacco products, such as cigarettes, chewing tobacco, and e-cigarettes. If you need help quitting, ask your doctor.  Keep all follow-up visits as told by your doctor. This is important. Contact a doctor if:  The machine that you were given to use during sleep is uncomfortable or does not seem to be working.  Your symptoms do not get better.  Your symptoms get worse. Get help right away if:  Your chest hurts.  You have trouble breathing in enough air (shortness of breath).  You have an uncomfortable feeling in your back, arms, or stomach.  You have trouble talking.  One side of your body feels weak.  A part of your face is hanging down (drooping). These symptoms may be an emergency. Do not wait to see if the symptoms will go away. Get medical help right away. Call your local emergency services (911 in the [place] Do not drive yourself to the hospital. This  information is not intended to replace advice given to you by your health care provider. Make sure you discuss any questions you have with your health care provider. Document Released: 02/29/2008 Document Revised: 01/16/2016 Document Reviewed: 03/01/2015 Elsevier Interactive Patient Education  [employer].

## 2017-06-18 NOTE — Progress Notes (Signed)
[facility] [place], [place] [address]  Pulmonary Sleep Medicine  Office Visit Note  Patient Name: Adam Merritt DOB: 09/27/1945 MRN [id number]  Date of Service: 06/18/2017     Complaints/HPI: he is doing well overall. Has no admissions. He contineus to see the wound care doctor on his left heal. No cough or congestion noted. He uses oxygen at night when he sleeps. He does snore does not tolerate CPAP. PFT done had shown no change stable at this time  Current Medication: Outpatient Encounter Medications as of 06/18/2017  Medication Sig  . albuterol (PROVENTIL HFA;VENTOLIN HFA) 108 (90 BASE) MCG/ACT inhaler Inhale 1 puff into the lungs every 6 (six) hours as needed for wheezing or shortness of breath.  Marland Kitchen aspirin EC 81 MG EC tablet Take 1 tablet (81 mg total) by mouth daily.  Marland Kitchen atorvastatin (LIPITOR) 40 MG tablet Take 20 mg by mouth at bedtime.   . carvedilol (COREG) 25 MG tablet Take 25 mg by mouth 2 (two) times daily with a meal.  . clobetasol ointment (TEMOVATE) 0.92 % Apply 1 application topically 2 (two) times daily. Pt applies to lower legs.  . clopidogrel (PLAVIX) 75 MG tablet Take 1 tablet (75 mg total) by mouth daily.  . fentaNYL (DURAGESIC - DOSED MCG/HR) 25 MCG/HR patch 1 patch every 3 (three) days.  Marland Kitchen gabapentin (NEURONTIN) 100 MG capsule Take 200 mg by mouth 3 (three) times daily.   . hydrALAZINE (APRESOLINE) 25 MG tablet Take 25 mg by mouth 3 (three) times daily.  . hydrocortisone 2.5 % cream Apply topically 2 (two) times daily.  Marland Kitchen ibuprofen (ADVIL,MOTRIN) 600 MG tablet Take 600 mg by mouth 2 (two) times daily as needed.  Marland Kitchen ipratropium-albuterol (DUONEB) 0.5-2.5 (3) MG/3ML SOLN Take 3 mLs by nebulization every 4 (four) hours as needed (for shortness of breath).  . Liraglutide (VICTOZA) 18 MG/3ML SOPN Inject 1.8 mg into the skin daily.   . metolazone (ZAROXOLYN) 2.5 MG tablet Take 2.5 mg by mouth daily.  . montelukast (SINGULAIR) 10 MG tablet Take  10 mg by mouth daily as needed (for allergies).   . Multiple Vitamin (MULTIVITAMIN WITH MINERALS) TABS tablet Take 1 tablet by mouth daily.  Marland Kitchen omeprazole (PRILOSEC) 20 MG capsule Take 20 mg by mouth daily.  . Oxycodone HCl 10 MG TABS Take 0.5-1 tablets by mouth 3 (three) times daily as needed.  . pentoxifylline (TRENTAL) 400 MG CR tablet Take 400 mg by mouth 3 (three) times daily.  . predniSONE (DELTASONE) 10 MG tablet Take 1 tablet (10 mg total) by mouth daily with breakfast.  . zolpidem (AMBIEN CR) 12.5 MG CR tablet Take 12.5 mg by mouth at bedtime as needed for sleep.   No facility-administered encounter medications on file as of 06/18/2017.     Surgical History: Past Surgical History:  Procedure Laterality Date  . CHOLECYSTECTOMY    . CORONARY ARTERY BYPASS GRAFT      Medical History: Past Medical History:  Diagnosis Date  . Anemia   . CAD (coronary artery disease)   . Chest pain   . Coronary artery disease   . Diabetes mellitus without complication ([place])   . Esophageal reflux   . Herpes zoster without mention of complication   . Hyperlipidemia   . Hypertension   . Insomnia   . Lumbago   . Neuropathy in diabetes ([place])   . Obstructive chronic bronchitis without exacerbation ([place])   . Other malaise and fatigue   . Stroke (  [place])   . Varicose veins     Family History: Family History  Problem Relation Age of Onset  . Diabetes Mother   . Hyperlipidemia Mother   . Hypertension Mother   . Diabetes Father   . Hyperlipidemia Father   . Hypertension Father     Social History: Social History   Socioeconomic History  . Marital status: Married    Spouse name: Not on file  . Number of children: Not on file  . Years of education: Not on file  . Highest education level: Not on file  Social Needs  . Financial resource strain: Not on file  . Food insecurity - worry: Not on file  . Food insecurity - inability: Not on file  . Transportation needs - medical: Not on file  .  Transportation needs - non-medical: Not on file  Occupational History  . Not on file  Tobacco Use  . Smoking status: Never Smoker  . Smokeless tobacco: Never Used  Substance and Sexual Activity  . Alcohol use: No  . Drug use: No  . Sexual activity: Not on file  Other Topics Concern  . Not on file  Social History Narrative  . Not on file     ROS  General: (-) fever, (-) chills, (-) night sweats, (-) weakness Skin: (-) rashes, (-) itching,. Eyes: (-) visual changes, (-) redness, (-) itching, (-) double or blurred vision. Nose and Sinuses: (-) nasal stuffiness or itchiness, (-) postnasal drip, (-) nosebleeds, (-) sinus trouble. Mouth and Throat: (-) sore throat, (-) hoarseness. Neck: (-) swollen glands, (-) enlarged thyroid, (-) neck pain. Respiratory: (-) cough, (-) bloody sputum, +shortness of breath, (-) wheezing. Cardiovascular: +ankle swelling, (-) chest pain. Lymphatic: (-) lymph node enlargement, (-) lymph node tenderness. Neurologic: (-) numbness, (-) tingling,(-) dizziness. Psychiatric: (-) anxiety, (-) depression.  Vital Signs: Blood pressure 138/72, pulse 82, resp. rate 16, height 5\' 6"  (1.676 m), weight (!) 304 lb 3.2 oz (138 kg), SpO2 98 %.  Examination: General Appearance: The patient is well-developed, well-nourished, and in no distress. Skin: Gross inspection of skin demonstrates no evidence of abnormality. Head: Patient's head is normocephalic, no gross deformities. Eyes: no gross deformities noted. ENT: ears appear grossly normal. Nasopharynx appears to be normal. Neck: Supple. No thyromegaly. No LAD. Respiratory: Lungs are clear to auscultation with no adventitious sounds. Cardiovascular: Normal S1 and S2 without murmur or rub. Extremities: No cyanosis. pulses are equal. Neurologic: Alert and oriented. No involuntary movements.  LABS: No results found for this or any previous visit (from the past 2160 hour(s)).  Radiology: US Carotid Bilateral (at  [facility])  Result Date: 09/16/2016 CLINICAL DATA:  CVA.  Blurry vision.  Morbid obesity. EXAM: BILATERAL CAROTID DUPLEX ULTRASOUND TECHNIQUE: Pearline Cables scale imaging, color Doppler and duplex ultrasound were performed of bilateral carotid and vertebral arteries in the neck. COMPARISON:  None. FINDINGS: Criteria: Quantification of carotid stenosis is based on velocity parameters that correlate the residual internal carotid diameter with NASCET-based stenosis levels, using the diameter of the distal internal carotid lumen as the denominator for stenosis measurement. The following velocity measurements were obtained: RIGHT ICA:  124 cm/sec CCA:  80 cm/sec SYSTOLIC ICA/CCA RATIO:  1.5 DIASTOLIC ICA/CCA RATIO:  2.1 ECA:  185 cm/sec LEFT ICA:  139 cm/sec CCA:  818 cm/sec SYSTOLIC ICA/CCA RATIO:  1.2 DIASTOLIC ICA/CCA RATIO:  1.2 ECA:  117 cm/sec RIGHT CAROTID ARTERY: There is mild focal calcified plaque in the bulb. Low resistance internal carotid Doppler  pattern. RIGHT VERTEBRAL ARTERY:  Antegrade. LEFT CAROTID ARTERY: There is moderate calcified plaque in the left bulb. Low resistance internal carotid Doppler pattern is preserved. Tachycardia is noted. LEFT VERTEBRAL ARTERY:  Antegrade. IMPRESSION: Less than 50% stenosis in the right internal carotid artery. 50-69% stenosis in the left internal carotid artery. Electronically Signed   By: Marybelle Killings M.D.   On: 09/16/2016 12:09    No results found.  No results found.    Assessment and Plan: Patient Active Problem List   Diagnosis Date Noted  . CVA (cerebral vascular accident) ([place]) 09/15/2016  . CVA (cerebral infarction) 02/12/2015  . Peripheral vascular disease, unspecified ([place]) 09/29/2013    1. OSA Doing fine with is oxygen therapy Not able to tolerate the CPAP Work on weight loss  2. Morbid Obesity Again dietary discussed needs to lose weight  3. Combined systolic diastolic heart failure Follow with cards and PCP  4. CKD Stage  3 Follow labs and see PCP last cr was 1.18   General Counseling: I have discussed the findings of the evaluation and examination with Umair.  I have also discussed any further diagnostic evaluation thatmay be needed or ordered today. Cade verbalizes understanding of the findings of todays visit. We also reviewed his medications today and discussed drug interactions and side effects including but not limited excessive drowsiness and altered mental states. We also discussed that there is always a risk not just to him but also people around him. he has been encouraged to call the office with any questions or concerns that should arise related to todays visit.    Time spent: 41min  I have personally obtained a history, examined the patient, evaluated laboratory and imaging results, formulated the assessment and plan and placed orders.    Allyne Gee, MD [facility] Pulmonary and Critical Care Sleep medicine

## 2017-06-19 NOTE — Progress Notes (Signed)
Adam Merritt ([id number]) Visit Report for 06/18/2017 Chief Complaint Document Details Patient Name: Adam Merritt, Adam Merritt Date of Service: 06/18/2017 1:45 PM Medical Record Number: [id number] Patient Account Number: [id number] Date of Birth/Sex: [birth year] (71 y.o. Male) Treating RN: Montey Hora Primary Care Provider: [name] Other Clinician: Referring Provider: [name] Treating Provider/Extender: Melburn Hake, Neil Errickson [name]: 12 Information Obtained from: Patient Chief Complaint the patient was recently discharged and now is back for a pressure ulcer on his left heel Electronic Signature(s) Signed: 06/19/2017 10:30:07 AM By: Worthy Keeler PA-C Entered By: Worthy Keeler on 06/18/2017 13:54:21 Adam Merritt ([id number]) -------------------------------------------------------------------------------- Debridement Details Patient Name: Adam Merritt Date of Service: 06/18/2017 1:45 PM Medical Record Number: [id number] Patient Account Number: [id number] Date of Birth/Sex: Mar 18, 1946 (72 y.o. Male) Treating RN: Montey Hora Primary Care Provider: [name] Other Clinician: Referring Provider: [name] Treating Provider/Extender: Melburn Hake, Jaymon Dudek [name]: 12 Debridement Performed for Wound #8 Left Calcaneus Assessment: Performed By: Physician STONE III, Lezley Bedgood E., PA-C Debridement: Debridement Severity of Tissue Pre Fat layer exposed Debridement: Pre-procedure Verification/Time Yes - 14:35 Out Taken: Start Time: 14:35 Pain Control: Lidocaine 4% Topical Solution Level: Skin/Subcutaneous Tissue Total Area Debrided (L x W): 0.4 (cm) x 1.2 (cm) = 0.48 (cm) Tissue and other material Viable, Non-Viable, Callus, Eschar, Exudate, Fibrin/Slough, Subcutaneous debrided: Instrument: Curette Bleeding: None End Time: 14:41 Procedural Pain: 0 Post Procedural Pain: 0 Response to Treatment: Procedure was tolerated well Post Debridement Measurements of Total  Wound Length: (cm) 0.1 Stage: Category/Stage III Width: (cm) 0.3 Depth: (cm) 0.1 Volume: (cm) 0.002 Character of Wound/Ulcer Post Improved Debridement: Severity of Tissue Post Debridement: Fat layer exposed Post Procedure Diagnosis Same as Pre-procedure Electronic Signature(s) Signed: 06/18/2017 4:52:46 PM By: Montey Hora Signed: 06/19/2017 10:30:07 AM By: Worthy Keeler PA-C Entered By: Montey Hora on 06/18/2017 14:44:00 Adam Merritt ([id number]) -------------------------------------------------------------------------------- HPI Details Patient Name: Adam Merritt Date of Service: 06/18/2017 1:45 PM Medical Record Number: [id number] Patient Account Number: [id number] Date of Birth/Sex: February 22, 1946 (72 y.o. Male) Treating RN: Montey Hora Primary Care Provider: [name] Other Clinician: Referring Provider: [name] Treating Provider/Extender: STONE III, Ahmiya Abee [name]: 12 History of Present Illness Location: left lower extremity lymphedema with a small open wound which is fairly superficial on his left anterior calf Quality: Patient reports No Pain. Severity: Patient states wound are getting better Duration: his happened only last week Context: The wound appeared gradually over time Modifying Factors: he is being treated at the lymphedema clinic with regular wrapping of his left lower extremity Associated Signs and Symptoms: Patient reports having difficulty standing for long periods. HPI Description: 72 year old gentleman with chronic bilateral lower extremity lymphedema associated with diabetes mellitus and obesity has been very compliant with his management and has been going to the lymphedema clinic in the recent past. He now returns with a small superficial ulcer on the left anterior lower calf about a week's duration He has been under the treatment of the lymphedema clinic for the last month. 04/20/2017 -- the patient was discharged a few weeks  ago for lymphedema with ulceration and is under the care of the lymphedema clinic. However he had a fall about 2 weeks ago where he lost consciousness and was laying on the floor for about an hour. During this time he has sustained a pressure injury to the left heel. Of note he has had a vascular consult by Dr. Annamarie Major -- on 09/29/2013 and Dr. Trula Slade  opinion was:"I have reviewed his outside ultrasound which shows an ankle-brachial index of 0.72 on the left and 1.0 on the right.o This study was repeated at our office.o The ankle-brachial index on the right was 1.26 with triphasic waveforms.o On the left it was a 1.04 with biphasic waveforms.o The patient had a toe pressure of 121 on the right and 100 on the left. In summary, I would not recommend any further imaging , as I do not think his symptoms are related to arterial insufficiency, " 06/04/17 patient's heel ulcer appears to be doing excellent today. He has been tolerating the dressing changes without complication. No fevers, chills, nausea, or vomiting noted at this time. I am very pleased with the progress he has made since my last evaluation with him. Patient is having no pain. 06/18/17 on evaluation today patient appears to be doing very well in regard to his left heel wound. In fact this appears to be almost healed. There was eschar covering which did require removal today and underlying there did appear to still be a small opening although this is very minimal compared to where he has been. Overall I'm extremely pleased with the progress that he has made. Patient likewise is also pleased. ===== Old notes: 72 year old gentleman seen earlier at our clinic in June 2016 and again in June 2017 is noncompliant with recommendations and does not follow-up regularly. 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 He had referred him to the lymphedema clinic and he was accepted but he did not follow-up with them. At that stage he had agreed to get a repeat arterial study done and he has seen Dr. Trula Slade in [place]. This time around he has been seen by his PCP Dr. Tiburcio Bash who referred him urgently to [place] for bilateral lower extremity cellulitis in spite of being on antibiotics -- he was placed on Keflex and 500 mg 3 times a day for 10 days. His comorbidities include coronary artery disease, diabetes mellitus, hypertension, Bell's palsy and chronic lymphedema. he was also recently admitted to the hospital on April 13 and kept overnight with the diagnosis of a TIA and a left ICA stenosis to follow-up with vascular surgery as an outpatient 10/05/2016 -- patient has been compliant this week and left his compression and dressing on for the entire week and had home health change it appropriately. LOUI, [place] ([id number]) 11/13/2016 -- his insurance will not cover compression stockings for lymphedema and hence we will have to send him to the lymphedema clinic as soon as we have finished with his healing. 11/20/2016 -- his lymphedema clinic appointment is still pending but other than that he has done very well and we are ready to transition him over. 11/27/16 patient's wounds appeared to be completely healed on evaluation today. He still has bilateral left edema and has been referred to the lymphedema clinic although we are still waiting to hear back from them on scheduling. Apparently she has to be discharged from home health services prior to being able to establish with the lymphedema clinic. At this point considering he is completely healed from the standpoint of wounds of the bilateral lower extremities I think it is appropriate to discharge him from home health and initiate weekly wraps on our end.  There is no evidence of  infection. 01/01/2017 -- the patient was recently discharged on 12/04/2016 and asked to follow-up in the lymphedema clinic. However they seem to be some issues with his going to the lymphedema clinic and then referring him to dermatology for an opinion regarding his skin. Ultimately the patient did not have a follow-up and has now redeveloped wheezing from the right lower extremity and ulceration on the left lower extremity. The patient and his wife are rather helpless and do not seem to be very compliant with their care. 01/08/17 on evaluation today patient has just a single lower extremity wounds noted. Fortunately he is having no significant pain, no bowe or bladder dysfunction, no nausea and no vomiting. There's nothing to indicate a systemic worsening infection. 01/16/17 on a violation today patient appears to be doing fairly well in regard to his bilateral lower extremities. He has a very small ulcer which is barely openable left lower extremity and otherwise is completely closed in regard to both legs. I believe we may be ready to get him back to the lymphedema clinic at this point. ==== Old Notes: 72 year old gentleman who comes with bilateral lower limb edema for over 20 years and recent attack of blisters and redness for about 2 weeks. The patient says he has had bilateral lower limb edema since 1996. He has been noncompliant with wearing wraps in the past and even was given a boot to wear at night which she never tolerated and didn't wear it. He has a past medical history of sleep apnea, neuropathy with diabetes, hypertension, hyperlipidemia, coronary artery disease, varicose veins, carotid artery disease and is status post CABG and cholecystectomy. He was seen by the vascular surgeon on 09/29/2013 and Dr. Trula Slade opinion was:"I have reviewed his outside ultrasound which shows an ankle-brachial index of 0.72 on the left and 1.0 on the right.o This study was repeated at our office.o  The ankle-brachial index on the right was 1.26 with triphasic waveforms.o On the left it was a 1.04 with biphasic waveforms.o The patient had a toe pressure of 121 on the right and 100 on the left. In summary, I would not recommend any further imaging , as I do not think his symptoms are related to arterial insufficiency, but rather secondary to diabetic neuropathy.o The patient still has room for increasing his Neurontin dose, which he states does help him.o I have scheduled him to come back in one year for a repeat arterial evaluation." in June 2016, a year ago, he has a venous duplex study to be done. He was also going to be accepted to the lymphedema clinic but I did not believe the patient went regularly for this. ==== Electronic Signature(s) Signed: 06/19/2017 10:30:07 AM By: Worthy Keeler PA-C Entered By: Worthy Keeler on 06/18/2017 16:31:16 Adam Merritt ([id number]) -------------------------------------------------------------------------------- Physical Exam Details Patient Name: Adam Merritt Date of Service: 06/18/2017 1:45 PM Medical Record Number: [id number] Patient Account Number: [id number] Date of Birth/Sex: [birth year] (72 y.o. Male) Treating RN: Montey Hora Primary Care Provider: [name] Other Clinician: Referring Provider: [name] Treating Provider/Extender: Melburn Hake, Augustina Braddock [name]: 12 Constitutional Obese and well-hydrated in no acute distress. Respiratory normal breathing without difficulty. Psychiatric this patient is able to make decisions and demonstrates good insight into disease process. Alert and Oriented x 3. pleasant and cooperative. Notes On the wound bed post debridement there did not appear to be anything two significant although there was a small opening that is  still going to take a little bit of time to completely resolved. However I do believe that it's possible in the next week this will be completely closed. Due to the  fact that it is so dry I am gonna recommend the dressing change utilizing hydrogel. Electronic Signature(s) Signed: 06/19/2017 10:30:07 AM By: Worthy Keeler PA-C Entered By: Worthy Keeler on 06/18/2017 16:32:14 Adam Merritt ([id number]) -------------------------------------------------------------------------------- Physician Orders Details Patient Name: Adam Merritt Date of Service: 06/18/2017 1:45 PM Medical Record Number: [id number] Patient Account Number: [id number] Date of Birth/Sex: Jul 28, 1945 (72 y.o. 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 o May Shower, gently pat wound dry prior to applying new dressing. Anesthetic (add to Medication List) Wound #8 Left Calcaneus o Topical Lidocaine 4% cream applied to wound bed prior to debridement (In Clinic Only). Primary Wound Dressing Wound #8 Left Calcaneus o Hydrogel Secondary Dressing Wound #8 Left Calcaneus o Dry Gauze o Conform/Kerlix - DO NOT WRAP TIGHT o Foam - Allevyn Heel Cup Dressing Change Frequency Wound #8 Left Calcaneus o Change dressing every other day. Follow-up Appointments o Return Appointment in 1 week. Edema Control Wound #8 Left Calcaneus o Patient to wear own compression stockings Additional Orders / Instructions Wound #8 Left Calcaneus o Increase protein intake. THURMON, MIZELL ([id number]) Home Health Wound  #8 Left Calcaneus o [facility] - [place] Nurse may visit PRN to address patientos wound care needs. o FACE TO FACE ENCOUNTER: MEDICARE and MEDICAID PATIENTS: I certify that this patient is under my care and that I had a face-to-face encounter that meets the physician face-to-face encounter requirements with this patient on this date. The encounter with the patient was in whole or in part for the following MEDICAL CONDITION: (primary reason for [place]) MEDICAL NECESSITY: I certify, that based on my findings, NURSING services are a medically necessary home health service. HOME BOUND STATUS: I certify that my clinical findings support that this patient is homebound (i.e., Due to illness or injury, pt requires aid of supportive devices such as crutches, cane, wheelchairs, walkers, the use of special transportation or the assistance of another person to leave their place of residence. There is a normal inability to leave the home and doing so requires considerable and taxing effort. Other absences are for medical reasons / religious services and are infrequent or of short duration when for other reasons). o If current dressing causes regression in wound condition, may D/C ordered dressing product/s and apply Normal Saline Moist Dressing daily until next [place] / Other MD appointment. [place] of regression in wound condition at [phone]. o Please direct any NON-WOUND related issues/requests for orders to patient's Primary Care Physician Electronic Signature(s) Signed: 06/18/2017 4:52:46 PM By: Montey Hora Signed: 06/19/2017 10:30:07 AM By: Worthy Keeler PA-C Entered By: Montey Hora on 06/18/2017 14:44:51 Adam Merritt ([id number]) -------------------------------------------------------------------------------- Problem List Details Patient Name: Adam Merritt Date of Service: 06/18/2017 1:45 PM Medical  Record Number: [id number]  Patient Account Number: [id number] Date of Birth/Sex: 07-Mar-1946 (72 y.o. 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 03/26/2017 Yes I89.0 Lymphedema, not elsewhere classified 03/26/2017 Yes E66.01 Morbid (severe) obesity due to excess calories 03/26/2017 Yes L97.221 Non-pressure chronic ulcer of left calf limited to breakdown of skin 03/26/2017 Yes L89.623 Pressure ulcer of left heel, stage 3 04/20/2017 Yes Inactive Problems Resolved Problems Electronic Signature(s) Signed: 06/19/2017 10:30:07 AM By: Worthy Keeler PA-C Entered By: Worthy Keeler on 06/18/2017 13:54:12 Adam Merritt ([id number]) -------------------------------------------------------------------------------- Progress Note Details Patient Name: Adam Merritt Date of Service: 06/18/2017 1:45 PM Medical Record Number: [id number] Patient Account Number: [id number] Date of Birth/Sex: 10-Jul-1945 (72 y.o. Male) Treating RN: Montey Hora Primary Care Provider: [name] Other Clinician: Referring Provider: [name] Treating Provider/Extender: Melburn Hake, Deedee Lybarger [name]: 12 Subjective Chief Complaint Information obtained from Patient the patient was recently discharged and now is back for a pressure ulcer on his left heel History of Present Illness (HPI) The following HPI elements were documented for the patient's wound: Location: left lower extremity lymphedema with a small open wound which is fairly superficial on his left anterior calf Quality: Patient reports No Pain. Severity: Patient states wound are getting better Duration: his happened only last week Context: The wound appeared gradually over  time Modifying Factors: he is being treated at the lymphedema clinic with regular wrapping of his left lower extremity Associated Signs and Symptoms: Patient reports having difficulty standing for long periods. 72 year old gentleman with chronic bilateral lower extremity lymphedema associated with diabetes mellitus and obesity has been very compliant with his management and has been going to the lymphedema clinic in the recent past. He now returns with a small superficial ulcer on the left anterior lower calf about a week's duration He has been under the treatment of the lymphedema clinic for the last month. 04/20/2017 -- the patient was discharged a few weeks ago for lymphedema with ulceration and is under the care of the lymphedema clinic. However he had a fall about 2 weeks ago where he lost consciousness and was laying on the floor for about an hour. During this time he has sustained a pressure injury to the left heel. Of note he has had a vascular consult by Dr. Annamarie Major -- on 09/29/2013 and Dr. Trula Slade opinion was:"I have reviewed his outside ultrasound which shows an ankle-brachial index of 0.72 on the left and 1.0 on the right. This study was repeated at our office. The ankle-brachial index on the right was 1.26 with triphasic waveforms. On the left it was a 1.04 with biphasic waveforms. The patient had a toe pressure of 121 on the right and 100 on the left. In summary, I would not recommend any further imaging , as I do not think his symptoms are related to arterial insufficiency, " 06/04/17 patient's heel ulcer appears to be doing excellent today. He has been tolerating the dressing changes without complication. No fevers, chills, nausea, or vomiting noted at this time. I am very pleased with the progress he has made since my last evaluation with him. Patient is having no pain. 06/18/17 on evaluation today patient appears to be doing very well in regard to his left heel wound. In fact  this appears to be almost healed. There was  eschar covering which did require removal today and underlying there did appear to still be a small opening although this is very minimal compared to where he has been. Overall I'm extremely pleased with the progress that he has made. Patient likewise is also pleased. ===== Old notes: 72 year old gentleman seen earlier at our clinic in June 2016 and again in June 2017 is noncompliant with recommendations and does not follow-up regularly. 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 He had referred him to the lymphedema clinic and he was accepted but he did not follow-up with them. At that stage he had agreed to get a repeat arterial study done and he has seen Dr. Trula Slade in [place]. THAMAS, APPLEYARD ([id number]) This time around he has been seen by his PCP Dr. Tiburcio Bash who referred him urgently to [place] for bilateral lower extremity cellulitis in spite of being on antibiotics -- he was placed on Keflex and 500 mg 3 times a day for 10 days. His comorbidities include coronary artery disease, diabetes mellitus, hypertension, Bell's palsy and chronic lymphedema. he was also recently admitted to the hospital on April 13 and kept overnight with the diagnosis of a TIA and a left ICA stenosis to follow-up with vascular surgery as an outpatient 10/05/2016 -- patient has been compliant this week and left his compression and dressing on for the entire week and had home health change it appropriately. 11/13/2016 -- his insurance will not cover compression stockings for lymphedema and hence we will have to send him to the lymphedema clinic as soon as we have finished with his healing. 11/20/2016 -- his lymphedema clinic appointment is still pending but other than  that he has done very well and we are ready to transition him over. 11/27/16 patient's wounds appeared to be completely healed on evaluation today. He still has bilateral left edema and has been referred to the lymphedema clinic although we are still waiting to hear back from them on scheduling. Apparently she has to be discharged from home health services prior to being able to establish with the lymphedema clinic. At this point considering he is completely healed from the standpoint of wounds of the bilateral lower extremities I think it is appropriate to discharge him from home health and initiate weekly wraps on our end. There is no evidence of infection. 01/01/2017 -- the patient was recently discharged on 12/04/2016 and asked to follow-up in the lymphedema clinic. However they seem to be some issues with his going to the lymphedema clinic and then referring him to dermatology for an opinion regarding his skin. Ultimately the patient did not have a follow-up and has now redeveloped wheezing from the right lower extremity and ulceration on the left lower extremity. The patient and his wife are rather helpless and do not seem to be very compliant with their care. 01/08/17 on evaluation today patient has just a single lower extremity wounds noted. Fortunately he is having no significant pain, no bowe or bladder dysfunction, no nausea and no vomiting. There's nothing to indicate a systemic worsening infection. 01/16/17 on a violation today patient appears to be doing fairly well in regard to his bilateral lower extremities. He has a very small ulcer which is barely openable left lower extremity and otherwise is completely  closed in regard to both legs. I believe we may be ready to get him back to the lymphedema clinic at this point. ==== Old Notes: 72 year old gentleman who comes with bilateral lower limb edema for over 20 years and recent attack of blisters and redness for about 2 weeks. The  patient says he has had bilateral lower limb edema since 1996. He has been noncompliant with wearing wraps in the past and even was given a boot to wear at night which she never tolerated and didn't wear it. He has a past medical history of sleep apnea, neuropathy with diabetes, hypertension, hyperlipidemia, coronary artery disease, varicose veins, carotid artery disease and is status post CABG and cholecystectomy. He was seen by the vascular surgeon on 09/29/2013 and Dr. Trula Slade opinion was:"I have reviewed his outside ultrasound which shows an ankle-brachial index of 0.72 on the left and 1.0 on the right. This study was repeated at our office. The ankle-brachial index on the right was 1.26 with triphasic waveforms. On the left it was a 1.04 with biphasic waveforms. The patient had a toe pressure of 121 on the right and 100 on the left. In summary, I would not recommend any further imaging , as I do not think his symptoms are related to arterial insufficiency, but rather secondary to diabetic neuropathy. The patient still has room for increasing his Neurontin dose, which he states does help him. I have scheduled him to come back in one year for a repeat arterial evaluation." in June 2016, a year ago, he has a venous duplex study to be done. He was also going to be accepted to the lymphedema clinic but I did not believe the patient went regularly for this. ==== Patient History Information obtained from Patient. Family History Cancer - Paternal Grandparents, Kidney Disease - Siblings, Lung Disease - Mother, BEACHER, EVERY ([id number]) No family history of Diabetes, Heart Disease, Hereditary Spherocytosis, Hypertension, Seizures, Stroke, Thyroid Problems, Tuberculosis. Social History Never smoker, Marital Status - Married, Alcohol Use - Never, Drug Use - No History, Caffeine Use - Never. Medical History Hospitalization/Surgery History - 11/03/2013, [facility], bronchitis. Medical And Surgical History  Notes Cardiovascular varicose veins Neurologic CVA - 1992 Review of Systems (ROS) Constitutional Symptoms (General Health) Denies complaints or symptoms of Fever, Chills. Respiratory The patient has no complaints or symptoms. Cardiovascular Complains or has symptoms of LE edema. Psychiatric The patient has no complaints or symptoms. Objective Constitutional Obese and well-hydrated in no acute distress. Vitals Time Taken: 2:16 PM, Height: 64 in, Weight: 298 lbs, BMI: 51.1, Temperature: 98.1 F, Pulse: 72 bpm, Respiratory Rate: 20 breaths/min, Blood Pressure: 146/88 mmHg. Respiratory normal breathing without difficulty. Psychiatric this patient is able to make decisions and demonstrates good insight into disease process. Alert and Oriented x 3. pleasant and cooperative. General Notes: On the wound bed post debridement there did not appear to be anything two significant although there was a small opening that is still going to take a little bit of time to completely resolved. However I do believe that it's possible in the next week this will be completely closed. Due to the fact that it is so dry I am gonna recommend the dressing change utilizing hydrogel. Integumentary (Hair, Skin) Wound #8 status is Open. Original cause of wound was Pressure Injury. The wound is located on the Left Calcaneus. The wound measures 0.4cm length x 1.2cm width x 0.1cm depth; 0.377cm^2 area and 0.038cm^3 volume. There is Fat SUNDIATA, FERRICK. ([id number]) (Subcutaneous Tissue) Exposed  exposed. There is no tunneling or undermining noted. There is a large amount of serous drainage noted. The wound margin is flat and intact. There is no granulation within the wound bed. There is a large (67-100%) amount of necrotic tissue within the wound bed including Eschar. The periwound skin appearance did not exhibit: Callus, Crepitus, Excoriation, Induration, Rash, Scarring, Dry/Scaly, Maceration, Atrophie Blanche,  Cyanosis, Ecchymosis, Hemosiderin Staining, Mottled, Pallor, Rubor, Erythema. Periwound temperature was noted as No Abnormality. The periwound has tenderness on palpation. 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 There was a Skin/Subcutaneous Tissue Debridement ([id number]) debridement with total area of 0.48 sq cm performed by STONE III, Mirakle Tomlin E., PA-C. with the following instrument(s): Curette to remove Viable and Non- Viable tissue/material including Exudate, Fibrin/Slough, Eschar, Callus, and Subcutaneous after achieving pain control using Lidocaine 4% Topical Solution. A time out was conducted at 14:35, prior to the start of the procedure. There was no bleeding. The procedure was tolerated well with a pain level of 0 throughout and a pain level of 0 following the procedure. Post Debridement Measurements: 0.1cm length x 0.3cm width x 0.1cm depth; 0.002cm^3 volume. Post debridement Stage noted as Category/Stage III. Character of Wound/Ulcer Post Debridement is improved. Severity of Tissue Post Debridement is: Fat layer exposed. Post procedure Diagnosis Wound #8: Same as Pre-Procedure Plan Wound Cleansing: Wound #8 Left Calcaneus: Clean wound with Normal Saline. May Shower, gently pat wound dry prior to applying new dressing. Anesthetic (add to Medication List): Wound #8 Left Calcaneus: Topical Lidocaine 4% cream applied to wound bed prior to debridement (In Clinic Only). Primary Wound Dressing: Wound #8 Left Calcaneus: Hydrogel Secondary  Dressing: VERDON, FERRANTE ([id number]) Wound #8 Left Calcaneus: Dry Gauze Conform/Kerlix - DO NOT WRAP TIGHT Foam - Allevyn Heel Cup Dressing Change Frequency: Wound #8 Left Calcaneus: Change dressing every other day. Follow-up Appointments: Return Appointment in 1 week. Edema Control: Wound #8 Left Calcaneus: Patient to wear own compression stockings Additional Orders / Instructions: Wound #8 Left Calcaneus: Increase protein intake. Home Health: Wound #8 Left Calcaneus: Continue Home Health Visits - [place] Nurse may visit PRN to address patient s wound care needs. FACE TO FACE ENCOUNTER: MEDICARE and MEDICAID PATIENTS: I certify that this patient is under my care and that I had a face-to-face encounter that meets the physician face-to-face encounter requirements with this patient on this date. The encounter with the patient was in whole or in part for the following MEDICAL CONDITION: (primary reason for [place]) MEDICAL NECESSITY: I certify, that based on my findings, NURSING services are a medically necessary home health service. HOME BOUND STATUS: I certify that my clinical findings support that this patient is homebound (i.e., Due to illness or injury, pt requires aid of supportive devices such as crutches, cane, wheelchairs, walkers, the use of special transportation or the assistance of another person to leave their place of residence. There is a normal inability to leave the home and doing so requires considerable and taxing effort. Other absences are for medical reasons / religious services and are infrequent or of short duration when  for other reasons). If current dressing causes regression in wound condition, may D/C ordered dressing product/s and apply Normal Saline Moist Dressing daily until next [place] / Other MD appointment. [place] of regression in wound condition at [phone]. Please direct any NON-WOUND  related issues/requests for orders to patient's Primary Care Physician I am going to suggest that we initiate the above wound care orders for the next week. We will see were things stand following and how his heel is doing although I expect that he may be completely healed in one weeks time. Patient is glad to hear this. Please see above for specific wound care orders. We will see patient for re-evaluation in 1 week(s) here in the clinic. If anything worsens or changes patient will contact our office for additional recommendations. Electronic Signature(s) Signed: 06/19/2017 10:30:07 AM By: Worthy Keeler PA-C Entered By: Worthy Keeler on 06/18/2017 16:32:49 Adam Merritt ([id number]) -------------------------------------------------------------------------------- ROS/PFSH Details Patient Name: Adam Merritt Date of Service: 06/18/2017 1:45 PM Medical Record Number: [id number] Patient Account Number: [id number] Date of Birth/Sex: 1946-03-08 (72 y.o. Male) Treating RN: Montey Hora Primary Care Provider: [name] Other Clinician: Referring Provider: [name] Treating Provider/Extender: Melburn Hake, Donyell Carrell [name]: 12 Information Obtained From Patient Wound History Do you currently have one or more open woundso Yes How many open wounds do you currently haveo 1 Approximately how long have you had your woundso 10 days How have you been treating your wound(s) until nowo dry dressing Has your wound(s) ever healed and then re-openedo No Have you had any lab work done in the past montho No Have you tested positive for an antibiotic resistant organism (MRSA, VRE)o No Have you tested positive for osteomyelitis (bone infection)o No Have you had any tests for circulation on your legso Yes Where was the test doneo [facility] Constitutional Symptoms (General Health) Complaints and Symptoms: Negative for: Fever; Chills Cardiovascular Complaints and Symptoms: Positive for: LE  edema Medical History: Positive for: Congestive Heart Failure; Coronary Artery Disease; Hypertension; Peripheral Venous Disease Negative for: Angina; Arrhythmia; Deep Vein Thrombosis; Hypotension; Myocardial Infarction; Peripheral Arterial Disease; Phlebitis; Vasculitis Past Medical History Notes: varicose veins Eyes Medical History: Negative for: Cataracts; Glaucoma; Optic Neuritis Ear/Nose/Mouth/Throat Medical History: Negative for: Chronic sinus problems/congestion; Middle ear problems Hematologic/Lymphatic Medical History: Positive for: Anemia - aplastic anemia; Lymphedema Negative for: Hemophilia; Human Immunodeficiency Virus; Sickle Cell Disease Respiratory ZACK, CRAGER. ([id number]) Complaints and Symptoms: No Complaints or Symptoms Medical History: Positive for: Chronic Obstructive Pulmonary Disease (COPD); Sleep Apnea Negative for: Aspiration; Asthma; Pneumothorax; Tuberculosis Gastrointestinal Medical History: Negative for: Cirrhosis ; Colitis; Crohnos; Hepatitis A; Hepatitis B; Hepatitis C Endocrine Medical History: Positive for: Type II Diabetes Negative for: Type I Diabetes Time with diabetes: since 2006 Treated with: Insulin Blood sugar tested every day: Yes Tested : Blood sugar testing results: Breakfast: 209 Genitourinary Medical History: Negative for: End Stage Renal Disease Immunological Medical History: Negative for: Lupus Erythematosus; Raynaudos; Scleroderma Integumentary (Skin) Medical History: Negative for: History of Burn; History of pressure wounds Musculoskeletal Medical History: Positive for: Osteoarthritis Negative for: Gout; Rheumatoid Arthritis; Osteomyelitis Neurologic Medical History: Positive for: Neuropathy Negative for: Dementia; Quadriplegia; Paraplegia; Seizure Disorder Past Medical History Notes: CVA - 1992 Oncologic Medical History: Positive for: Received Chemotherapy - last dose 2006 Negative for: Received  Radiation DIESEL, LINA ([id number]) Psychiatric Complaints and Symptoms: No Complaints or Symptoms Medical History: Negative for: Anorexia/bulimia; Confinement Anxiety Immunizations Pneumococcal Vaccine: Received Pneumococcal Vaccination: Yes Implantable  Devices Hospitalization / Surgery History Name of Hospital Purpose of Hospitalization/Surgery Date [facility] bronchitis 11/03/2013 Family and Social History Cancer: Yes - Paternal Grandparents; Diabetes: No; Heart Disease: No; Hereditary Spherocytosis: No; Hypertension: No; Kidney Disease: Yes - Siblings; Lung Disease: Yes - Mother; Seizures: No; Stroke: No; Thyroid Problems: No; Tuberculosis: No; Never smoker; Marital Status - Married; Alcohol Use: Never; Drug Use: No History; Caffeine Use: Never; Financial Concerns: No; Food, Clothing or Shelter Needs: No; Support System Lacking: No; Transportation Concerns: No; Advanced Directives: No; Patient does not want information on Advanced Directives Physician Affirmation I have reviewed and agree with the above information. Electronic Signature(s) Signed: 06/18/2017 4:52:46 PM By: Montey Hora Signed: 06/19/2017 10:30:07 AM By: Worthy Keeler PA-C Entered By: Worthy Keeler on 06/18/2017 16:31:50 Adam Merritt ([id number]) -------------------------------------------------------------------------------- SuperBill Details Patient Name: Adam Merritt Date of Service: 06/18/2017 Medical Record Number: [id number] Patient Account Number: [id number] Date of Birth/Sex: 1946-03-11 (72 y.o. 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 I89.0 Lymphedema, not elsewhere classified E66.01 Morbid (severe) obesity due to excess calories L97.221 Non-pressure  chronic ulcer of left calf limited to breakdown of skin L89.623 Pressure ulcer of left heel, stage 3 Facility Procedures CPT4 Code: [id number] Description: 85631 - DEB SUBQ TISSUE 20 SQ CM/< ICD-10 Diagnosis Description L89.623 Pressure ulcer of left heel, stage 3 Modifier: Quantity: 1 Physician Procedures CPT4 Code: [id number] Description: 11042 - WC PHYS SUBQ TISS 20 SQ CM ICD-10 Diagnosis Description L89.623 Pressure ulcer of left heel, stage 3 Modifier: Quantity: 1 Electronic Signature(s) Signed: 06/19/2017 10:30:07 AM By: Worthy Keeler PA-C Entered By: Worthy Keeler on 06/18/2017 16:33:10

## 2017-06-19 NOTE — Progress Notes (Signed)
ARY, LAVINE ([id number]) Visit Report for 06/18/2017 Arrival Information Details Patient Name: Adam Merritt, Adam Merritt. Date of Service: 06/18/2017 1:45 PM Medical Record Number: [id number] Patient Account Number: [id number] Date of Birth/Sex: January 18, 1946 (71 y.o. Male) Treating RN: Montey Hora Primary Care Liliahna Cudd: [name] Other Clinician: Referring Delylah Stanczyk: [name] Treating Dmauri Rosenow/Extender: Melburn Hake, [name]: 12 Visit Information History Since Last Visit Added or deleted any medications: No Patient Arrived: Cane Any new allergies or adverse reactions: No Arrival Time: 14:14 Had a fall or experienced change in No Accompanied By: spouse activities of daily living that may affect Transfer Assistance: None risk of falls: Patient Identification Verified: Yes Signs or symptoms of abuse/neglect since last visito No Secondary Verification Process Completed: Yes Hospitalized since last visit: No Patient Requires Transmission-Based Precautions: No Has Dressing in Place as Prescribed: Yes Patient Has Alerts: Yes Pain Present Now: No Patient Alerts: DMII Electronic Signature(s) Signed: 06/18/2017 4:52:46 PM By: Montey Hora Entered By: Montey Hora on 06/18/2017 14:14:50 Adam Merritt ([id number]) -------------------------------------------------------------------------------- Encounter Discharge Information Details Patient Name: Adam Merritt Date of Service: 06/18/2017 1:45 PM Medical Record Number: [id number] Patient Account Number: [id number] Date of Birth/Sex: 05-Jul-1945 (72 y.o. Male) Treating RN: Montey Hora Primary Care Arisa Congleton: [name] Other Clinician: Referring Krystianna Soth: [name] Treating Japleen Tornow/Extender: Melburn Hake, [name]: 12 Encounter Discharge Information Items Discharge Pain Level: 0 Discharge Condition: Stable Ambulatory Status: Cane Discharge Destination: Home Transportation: Private Auto Accompanied By:  spouse Schedule Follow-up Appointment: Yes Medication Reconciliation completed and No provided to Patient/Care Tashonda Pinkus: Provided on Clinical Summary of Care: 06/18/2017 Form Type Recipient Paper Patient JT Electronic Signature(s) Signed: 06/18/2017 4:46:17 PM By: Montey Hora Entered By: Montey Hora on 06/18/2017 16:46:17 Adam Merritt ([id number]) -------------------------------------------------------------------------------- Lower Extremity Assessment Details Patient Name: Adam Merritt Date of Service: 06/18/2017 1:45 PM Medical Record Number: [id number] Patient Account Number: [id number] Date of Birth/Sex: [birth year] (72 y.o. Male) Treating RN: Montey Hora Primary Care Frederika Hukill: [name] Other Clinician: Referring Mackinze Criado: [name] Treating Willean Schurman/Extender: Melburn Hake, [name]: 12 Vascular Assessment Pulses: Dorsalis Pedis Palpable: [Left:Yes] Posterior Tibial Extremity colors, hair growth, and conditions: Extremity Color: [Left:Hyperpigmented] Hair Growth on Extremity: [Left:No] Temperature of Extremity: [Left:Warm] Capillary Refill: [Left:< 3 seconds] Electronic Signature(s) Signed: 06/18/2017 4:52:46 PM By: Montey Hora Entered By: Montey Hora on 06/18/2017 14:22:10 Adam Merritt ([id number]) -------------------------------------------------------------------------------- Multi Wound Chart Details Patient Name: Adam Merritt Date of Service: 06/18/2017 1:45 PM Medical Record Number: [id number] Patient Account Number: [id number] Date of Birth/Sex: [birth year] (72 y.o. 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] [N/A:N/A] Comorbid History: [8:Anemia, Lymphedema, Chronic Obstructive Pulmonary Disease (COPD), Sleep Apnea, Congestive Heart Failure, Coronary Artery Disease, Hypertension, Peripheral Venous Disease, Type II Diabetes, Osteoarthritis, Neuropathy, Received  Chemotherapy] [N/A:N/A] Date Acquired: [8:04/16/2017] [N/A:N/A] Weeks of Treatment: [8:8] [N/A:N/A] Wound Status: [8:Open] [N/A:N/A] Measurements L x W x D [8:0.4x1.2x0.1] [N/A:N/A] (cm) Area (cm) : [8:0.377] [N/A:N/A] Volume (cm) : [8:0.038] [N/A:N/A] % Reduction in Area: [8:93.30%] [N/A:N/A] % Reduction in Volume: [8:93.30%] [N/A:N/A] Classification: [8:Category/Stage III] [N/A:N/A] Exudate Amount: [8:Large] [N/A:N/A] Exudate Type: [8:Serous] [N/A:N/A] Exudate  Color: [8:amber] [N/A:N/A] Wound Margin: [8:Flat and Intact] [N/A:N/A] Granulation Amount: [8:None Present (0%)] [N/A:N/A] Necrotic Amount: [8:Large (67-100%)] [N/A:N/A] Necrotic Tissue: [8:Eschar] [N/A:N/A] Exposed Structures: [8:Fat Layer (Subcutaneous Tissue) Exposed: Yes Fascia: No Tendon: No] [N/A:N/A] Muscle: No Joint: No Bone: No Epithelialization: Medium (34-66%) N/A N/A Periwound Skin Texture: Excoriation: No N/A N/A Induration: No Callus: No Crepitus: No Rash: No Scarring: No Periwound Skin Moisture: Maceration: No N/A N/A Dry/Scaly: No Periwound Skin Color: Atrophie Blanche: No N/A N/A Cyanosis: No Ecchymosis: No Erythema: No Hemosiderin Staining: No Mottled: No Pallor: No Rubor: No Temperature: No Abnormality N/A N/A Tenderness on Palpation: Yes N/A N/A Wound Preparation: Ulcer Cleansing: N/A N/A Rinsed/Irrigated with Saline Topical Anesthetic Applied: Other: lidocaine  4% Treatment Notes Electronic Signature(s) Signed: 06/18/2017 4:52:46 PM By: Montey Hora Entered By: Montey Hora on 06/18/2017 14:34:47 Adam Merritt ([id number]) -------------------------------------------------------------------------------- [place] Details Patient Name: Adam Merritt Date of Service: 06/18/2017 1:45 PM Medical Record Number: [id number] Patient Account Number: [id number] Date of Birth/Sex: 21-Aug-1945 (72 y.o. Male) Treating RN: Montey Hora Primary Care Sigifredo Pignato: [name] Other Clinician: Referring Keats Kingry: [name] Treating Jaquanna Ballentine/Extender: Melburn Hake, [name]: 12 Active Inactive Electronic Signature(s) Signed: 06/18/2017 4:52:46 PM By: Montey Hora Entered By: Montey Hora on 06/18/2017 14:22:55 Adam Merritt ([id number]) -------------------------------------------------------------------------------- Pain Assessment Details Patient Name: Adam Merritt Date of Service: 06/18/2017 1:45 PM Medical Record Number: [id number] Patient Account Number: [id number] Date of Birth/Sex: Aug 08, 1945 (72 y.o. Male) Treating RN: Montey Hora Primary Care Anelise Staron: [name] Other Clinician: Referring Cortne Amara: [name] Treating Bronc Brosseau/Extender: Melburn Hake, [name]: 12 Active Problems Location of Pain Severity and Description of Pain Patient Has Paino No Site Locations Pain Management and Medication Current Pain Management: Electronic Signature(s) Signed: 06/18/2017 4:52:46 PM By: Montey Hora Entered By: Montey Hora on 06/18/2017 14:16:04 Adam Merritt ([id number]) -------------------------------------------------------------------------------- Patient/Caregiver Education Details Patient Name: Adam Merritt Date of Service: 06/18/2017 1:45 PM Medical Record Number: [id number] Patient Account Number: [id number] Date of Birth/Gender: [birth year] (72 y.o. Male) Treating RN: Montey Hora Primary Care Physician: [name] Other Clinician: Referring Physician: [name] Treating Physician/Extender: Sharalyn Ink in Treatment: 12 Education Assessment Education Provided To: Patient Education Topics Provided Offloading: Handouts: Other: continue offloading heel Methods: Explain/Verbal Responses: State content correctly Electronic Signature(s) Signed: 06/18/2017 4:52:46 PM By: Montey Hora Entered By: Montey Hora on 06/18/2017 16:46:37 Adam Merritt ([id number]) -------------------------------------------------------------------------------- Wound Assessment Details Patient Name: Adam Merritt Date of Service: 06/18/2017 1:45 PM Medical Record Number: [id number] Patient Account Number: [id number] Date of Birth/Sex: [birth year] (72 y.o. Male) Treating RN: Montey Hora Primary Care Decarlo Rivet: [name] Other Clinician: Referring Alarik Radu: [name] Treating Taos Tapp/Extender: Melburn Hake, [name]: 12 Wound Status Wound Number: 8 Primary Pressure Ulcer Etiology: Wound Location: Left Calcaneus Secondary Diabetic Wound/Ulcer of the Lower Extremity Wounding Event: Pressure Injury Etiology: Date Acquired: 04/16/2017 Wound Open Weeks Of Treatment: 8 Status: Clustered Wound: No Comorbid Anemia, Lymphedema, Chronic Obstructive History: Pulmonary Disease (COPD), Sleep Apnea, Congestive Heart Failure, Coronary Artery Disease, Hypertension, Peripheral Venous Disease, Type II Diabetes, Osteoarthritis, Neuropathy, Received Chemotherapy Photos Photo Uploaded By: Montey Hora on 06/19/2017 10:03:05 Wound Measurements Length: (cm) 0.4 Width: (cm) 1.2 Depth: (cm) 0.1 Area: (cm) 0.377 Volume: (cm) 0.038 % Reduction in Area: 93.3% % Reduction in Volume: 93.3% Epithelialization: Medium (34-66%) Tunneling: No Undermining: No Wound Description Classification: Category/Stage III Wound Margin: Flat and Intact Exudate  Amount: Large Exudate Type: Serous Exudate Color: amber Foul  Odor After Cleansing: No Slough/Fibrino Yes Wound Bed Granulation Amount: None Present (0%) Exposed Structure Necrotic Amount: Large (67-100%) Fascia Exposed: No Necrotic Quality: Eschar Fat Layer (Subcutaneous Tissue) Exposed: Yes Tendon Exposed: No KELLER, BOUNDS ([id number]) Muscle Exposed: No Joint Exposed: No Bone Exposed: No Periwound Skin Texture Texture Color No Abnormalities Noted: No No Abnormalities Noted: No Callus: No Atrophie Blanche: No Crepitus: No Cyanosis: No Excoriation: No Ecchymosis: No Induration: No Erythema: No Rash: No Hemosiderin Staining: No Scarring: No Mottled: No Pallor: No Moisture Rubor: No No Abnormalities Noted: No Dry / Scaly: No Temperature / Pain Maceration: No Temperature: No Abnormality Tenderness on Palpation: Yes Wound Preparation Ulcer Cleansing: Rinsed/Irrigated with Saline Topical Anesthetic Applied: Other: lidocaine 4%, Treatment Notes Wound #8 (Left Calcaneus) 1. Cleansed with: Clean wound with Normal Saline 2. Anesthetic Topical Lidocaine 4% cream to wound bed prior to debridement 4. Dressing Applied: Hydrogel 5. Secondary Dressing Applied Dry [place] Signature(s) Signed: 06/18/2017 4:52:46 PM By: Montey Hora Entered By: Montey Hora on 06/18/2017 14:21:49 Adam Merritt ([id number]) -------------------------------------------------------------------------------- [place] Details Patient Name: Adam Merritt Date of Service: 06/18/2017 1:45 PM Medical Record Number: [id number] Patient Account Number: [id number] Date of Birth/Sex: 08-Apr-1946 (72 y.o. Male) Treating RN: Montey Hora Primary Care Lylith Bebeau: [name] Other Clinician: Referring Tamim Skog: [name] Treating Kim Oki/Extender: Melburn Hake, [name]: 12 Vital Signs Time Taken: 14:16 Temperature (F): 98.1 Height (in): 64 Pulse (bpm):  72 Weight (lbs): 298 Respiratory Rate (breaths/min): 20 Body Mass Index (BMI): 51.1 Blood Pressure (mmHg): 146/88 Reference Range: 80 - 120 mg / dl Electronic Signature(s) Signed: 06/18/2017 4:52:46 PM By: Montey Hora Entered By: Montey Hora on 06/18/2017 14:17:32

## 2017-06-21 ENCOUNTER — Telehealth: Payer: Self-pay

## 2017-06-21 NOTE — Telephone Encounter (Signed)
Carmen from [facility] left vm asking for verbal orders for compression stocking and coban for pt.  I called back and got vm and I left message for ok to get supplies.  dbs

## 2017-06-25 ENCOUNTER — Ambulatory Visit: Payer: Medicare Other | Admitting: Physician Assistant

## 2017-06-29 ENCOUNTER — Ambulatory Visit: Payer: Self-pay | Admitting: Nurse Practitioner

## 2017-06-29 ENCOUNTER — Ambulatory Visit (INDEPENDENT_AMBULATORY_CARE_PROVIDER_SITE_OTHER): Payer: Medicare Other | Admitting: Nurse Practitioner

## 2017-06-29 ENCOUNTER — Encounter: Payer: Self-pay | Admitting: Nurse Practitioner

## 2017-06-29 VITALS — BP 176/94 | HR 76 | Resp 16 | Ht 66.0 in | Wt 309.8 lb

## 2017-06-29 DIAGNOSIS — F5101 Primary insomnia: Secondary | ICD-10-CM | POA: Diagnosis not present

## 2017-06-29 DIAGNOSIS — E11610 Type 2 diabetes mellitus with diabetic neuropathic arthropathy: Secondary | ICD-10-CM

## 2017-06-29 DIAGNOSIS — G894 Chronic pain syndrome: Secondary | ICD-10-CM

## 2017-06-29 LAB — POCT GLYCOSYLATED HEMOGLOBIN (HGB A1C): Hemoglobin A1C: 6.8

## 2017-06-29 MED ORDER — OXYCODONE HCL 10 MG PO TABS: 10.0000 mg | ORAL_TABLET | Freq: Three times a day (TID) | ORAL | 0 refills | Status: DC | PRN

## 2017-06-29 MED ORDER — ZOLPIDEM TARTRATE ER 12.5 MG PO TBCR: 12.5000 mg | EXTENDED_RELEASE_TABLET | Freq: Every evening | ORAL | 3 refills | Status: DC | PRN

## 2017-06-29 MED ORDER — FENTANYL 50 MCG/HR TD PT72: 50.0000 ug | MEDICATED_PATCH | TRANSDERMAL | 0 refills | Status: DC

## 2017-07-06 ENCOUNTER — Encounter: Payer: Medicare Other | Attending: Physician Assistant | Admitting: Physician Assistant

## 2017-07-06 DIAGNOSIS — L89623 Pressure ulcer of left heel, stage 3: Secondary | ICD-10-CM | POA: Insufficient documentation

## 2017-07-06 DIAGNOSIS — Z794 Long term (current) use of insulin: Secondary | ICD-10-CM | POA: Insufficient documentation

## 2017-07-06 DIAGNOSIS — E1151 Type 2 diabetes mellitus with diabetic peripheral angiopathy without gangrene: Secondary | ICD-10-CM | POA: Insufficient documentation

## 2017-07-06 DIAGNOSIS — E11620 Type 2 diabetes mellitus with diabetic dermatitis: Secondary | ICD-10-CM | POA: Insufficient documentation

## 2017-07-06 DIAGNOSIS — I252 Old myocardial infarction: Secondary | ICD-10-CM | POA: Diagnosis not present

## 2017-07-06 DIAGNOSIS — J449 Chronic obstructive pulmonary disease, unspecified: Secondary | ICD-10-CM | POA: Diagnosis not present

## 2017-07-06 DIAGNOSIS — Z9221 Personal history of antineoplastic chemotherapy: Secondary | ICD-10-CM | POA: Insufficient documentation

## 2017-07-06 DIAGNOSIS — I10 Essential (primary) hypertension: Secondary | ICD-10-CM | POA: Insufficient documentation

## 2017-07-06 DIAGNOSIS — Z86718 Personal history of other venous thrombosis and embolism: Secondary | ICD-10-CM | POA: Diagnosis not present

## 2017-07-06 DIAGNOSIS — G4730 Sleep apnea, unspecified: Secondary | ICD-10-CM | POA: Insufficient documentation

## 2017-07-06 DIAGNOSIS — E1140 Type 2 diabetes mellitus with diabetic neuropathy, unspecified: Secondary | ICD-10-CM | POA: Diagnosis not present

## 2017-07-06 DIAGNOSIS — Z6841 Body Mass Index (BMI) 40.0 and over, adult: Secondary | ICD-10-CM | POA: Insufficient documentation

## 2017-07-06 DIAGNOSIS — Z8673 Personal history of transient ischemic attack (TIA), and cerebral infarction without residual deficits: Secondary | ICD-10-CM | POA: Insufficient documentation

## 2017-07-06 DIAGNOSIS — I2510 Atherosclerotic heart disease of native coronary artery without angina pectoris: Secondary | ICD-10-CM | POA: Diagnosis not present

## 2017-07-08 NOTE — Progress Notes (Signed)
Adam Merritt, Adam Merritt ([id number]) Visit Report for 07/06/2017 Arrival Information Details Patient Name: Adam Merritt, Adam Merritt. Date of Service: 07/06/2017 12:30 PM Medical Record Number: [id number] Patient Account Number: [id number] Date of Birth/Sex: 1946-03-17 (71 y.o. Male) Treating RN: Ahmed Prima Primary Care Glenna Brunkow: [name] Other Clinician: Referring Mora Pedraza: [name] Treating Geovanny Sartin/Extender: Melburn Hake, [name]: 14 Visit Information History Since Last Visit All ordered tests and consults were completed: No Patient Arrived: [name] Added or deleted any medications: No Arrival Time: 13:02 Any new allergies or adverse reactions: No Accompanied By: wife Had a fall or experienced change in No Transfer Assistance: None activities of daily living that may affect Patient Identification Verified: Yes risk of falls: Secondary Verification Process Completed: Yes Signs or symptoms of abuse/neglect since last visito No Patient Requires Transmission-Based Precautions: No Hospitalized since last visit: No Patient Has Alerts: Yes Has Dressing in Place as Prescribed: Yes Patient Alerts: DMII Pain Present Now: No Electronic Signature(s) Signed: 07/06/2017 4:17:47 PM By: Alric Quan Entered By: Alric Quan on 07/06/2017 13:04:56 Adam Merritt ([id number]) -------------------------------------------------------------------------------- Encounter Discharge Information Details Patient Name: Adam Merritt Date of Service: 07/06/2017 12:30 PM Medical Record Number: [id number] Patient Account Number: [id number] Date of Birth/Sex: [birth year] (72 y.o. Male) Treating RN: Ahmed Prima Primary Care Darra Rosa: [name] Other Clinician: Referring Kaliyah Gladman: [name] Treating Marylynne Keelin/Extender: Melburn Hake, [name]: 14 Encounter Discharge Information Items Discharge Pain Level: 0 Discharge Condition: Stable Ambulatory Status: Cane Discharge Destination:  Home Transportation: Private Auto Accompanied By: self Schedule Follow-up Appointment: Yes Medication Reconciliation completed and No provided to Patient/Care Yaretsi Humphres: Provided on Clinical Summary of Care: 07/06/2017 Form Type Recipient Paper Patient JT Electronic Signature(s) Signed: 07/06/2017 1:39:57 PM By: Alric Quan Entered By: Alric Quan on 07/06/2017 13:39:56 Adam Merritt ([id number]) -------------------------------------------------------------------------------- Lower Extremity Assessment Details Patient Name: Adam Merritt Date of Service: 07/06/2017 12:30 PM Medical Record Number: [id number] Patient Account Number: [id number] Date of Birth/Sex: 12/09/1945 (72 y.o. Male) Treating RN: Ahmed Prima Primary Care Delroy Ordway: [name] Other Clinician: Referring Jayelle Page: [name] Treating Jackelynn Hosie/Extender: Melburn Hake, [name]: 14 Vascular Assessment Pulses: Dorsalis Pedis Palpable: [Left:Yes] Posterior Tibial Extremity colors, hair growth, and conditions: Extremity Color: [Left:Hyperpigmented] Temperature of Extremity: [Left:Warm] Capillary Refill: [Left:< 3 seconds] Toe Nail Assessment Left: Right: Thick: Yes Discolored: Yes Deformed: Yes Improper Length and Hygiene: No Electronic Signature(s) Signed: 07/06/2017 4:17:47 PM By: Alric Quan Entered By: Alric Quan on 07/06/2017 13:23:25 Adam Merritt ([id number]) -------------------------------------------------------------------------------- Multi Wound Chart Details Patient Name: Adam Merritt Date of Service: 07/06/2017 12:30 PM Medical Record Number: [id number] Patient Account Number: [id number] Date of Birth/Sex: 28-May-1946 (72 y.o. Male) Treating RN: Ahmed Prima Primary Care Basha Krygier: [name] Other Clinician: Referring Imagene Boss: [name] Treating Reily Treloar/Extender: STONE III, [name]: 14 Vital Signs Height(in): 64 Pulse(bpm):  72 Weight(lbs): 298 Blood Pressure(mmHg): 175/82 Body Mass Index(BMI): 51 Temperature(F): 97.7 Respiratory Rate 20 (breaths/min): Wound Assessments Treatment Notes Electronic Signature(s) Signed: 07/06/2017 4:17:47 PM By: Alric Quan Entered By: Alric Quan on 07/06/2017 13:24:25 Adam Merritt ([id number]) -------------------------------------------------------------------------------- [place] Details Patient Name: Adam Merritt Date of Service: 07/06/2017 12:30 PM Medical Record Number: [id number] Patient Account Number: [id number] Date of Birth/Sex: [birth year] (72 y.o. Male) Treating RN: Ahmed Prima Primary Care Cylie Dor: [name] Other Clinician: Referring Nivan Melendrez: [name] Treating Tomasa Dobransky/Extender: Melburn Hake, [name]: 14 Active Inactive Electronic Signature(s) Signed: 07/06/2017 4:17:47 PM By: Alric Quan Entered By: Alric Quan on 07/06/2017  13:23:35 Adam Merritt, Adam Merritt ([id number]) -------------------------------------------------------------------------------- Pain Assessment Details Patient Name: Adam Merritt, Adam Merritt Date of Service: 07/06/2017 12:30 PM Medical Record Number: [id number] Patient Account Number: [id number] Date of Birth/Sex: [birth year] (71 y.o. Male) Treating RN: Ahmed Prima Primary Care Izaia Say: [name] Other Clinician: Referring Mar Zettler: [name] Treating Seana Underwood/Extender: Melburn Hake, [name]: 14 Active Problems Location of Pain Severity and Description of Pain Patient Has Paino No Site Locations Pain Management and Medication Current Pain Management: Electronic Signature(s) Signed: 07/06/2017 4:17:47 PM By: Alric Quan Entered By: Alric Quan on 07/06/2017 13:05:03 Adam Merritt ([id number]) -------------------------------------------------------------------------------- Patient/Caregiver Education Details Patient Name: Adam Merritt Date of  Service: 07/06/2017 12:30 PM Medical Record Number: [id number] Patient Account Number: [id number] Date of Birth/Gender: December 26, 1945 (72 y.o. Male) Treating RN: Ahmed Prima Primary Care Physician: [name] Other Clinician: Referring Physician: [name] Treating Physician/Extender: Sharalyn Ink in Treatment: 14 Education Assessment Education Provided To: Patient Education Topics Provided Wound/Skin Impairment: Handouts: Caring for Your Ulcer, Other: change dressing as ordered Methods: Demonstration, Explain/Verbal Responses: State content correctly Electronic Signature(s) Signed: 07/06/2017 4:17:47 PM By: Alric Quan Entered By: Alric Quan on 07/06/2017 13:40:14 Adam Merritt ([id number]) -------------------------------------------------------------------------------- Wound Assessment Details Patient Name: Adam Merritt Date of Service: 07/06/2017 12:30 PM Medical Record Number: [id number] Patient Account Number: [id number] Date of Birth/Sex: [birth year] (72 y.o. Male) Treating RN: Ahmed Prima Primary Care Ibrahim Mcpheeters: [name] Other Clinician: Referring Alisah Grandberry: [name] Treating Shaunda Tipping/Extender: Melburn Hake, [name]: 14 Wound Status Wound Number: 8 Primary Pressure Ulcer Etiology: Wound Location: Left Calcaneus Secondary Diabetic Wound/Ulcer of the Lower Extremity Wounding Event: Pressure Injury Etiology: Date Acquired: 04/16/2017 Wound Open Weeks Of Treatment: 11 Status: Clustered Wound: No Comorbid Anemia, Lymphedema, Chronic Obstructive History: Pulmonary Disease (COPD), Sleep Apnea, Congestive Heart Failure, Coronary Artery Disease, Hypertension, Peripheral Venous Disease, Type II Diabetes, Osteoarthritis, Neuropathy, Received Chemotherapy Photos Photo Uploaded By: Alric Quan on 07/06/2017 13:46:01 Wound Measurements Length: (cm) 0.1 Width: (cm) 0.1 Depth: (cm) 0.1 Area: (cm) 0.008 Volume: (cm)  0.001 % Reduction in Area: 99.9% % Reduction in Volume: 99.8% Epithelialization: Medium (34-66%) Tunneling: No Undermining: No Wound Description Classification: Category/Stage III Wound Margin: Flat and Intact Exudate Amount: Medium Exudate Type: Serous Exudate Color: amber Foul Odor After Cleansing: No Slough/Fibrino Yes Wound Bed Granulation Amount: None Present (0%) Exposed Structure Necrotic Amount: Large (67-100%) Fascia Exposed: No Necrotic Quality: Eschar Fat Layer (Subcutaneous Tissue) Exposed: Yes Tendon Exposed: No AUREN, VALDES ([id number]) Muscle Exposed: No Joint Exposed: No Bone Exposed: No Periwound Skin Texture Texture Color No Abnormalities Noted: No No Abnormalities Noted: No Callus: No Atrophie Blanche: No Crepitus: No Cyanosis: No Excoriation: No Ecchymosis: No Induration: No Erythema: No Rash: No Hemosiderin Staining: No Scarring: No Mottled: No Pallor: No Moisture Rubor: No No Abnormalities Noted: No Dry / Scaly: No Temperature / Pain Maceration: No Temperature: No Abnormality Tenderness on Palpation: Yes Wound Preparation Ulcer Cleansing: Rinsed/Irrigated with Saline Topical Anesthetic Applied: Other: lidocaine 4%, Treatment Notes Wound #8 (Left Calcaneus) 1. Cleansed with: Clean wound with Normal Saline 2. Anesthetic Topical Lidocaine 4% cream to wound bed prior to debridement 4. Dressing Applied: Prisma Ag 5. Secondary Dressing Applied Dry Gauze Kerlix/Conform 7. Secured with Tape Notes netting, heel cup Electronic Signature(s) Signed: 07/06/2017 4:17:47 PM By: Alric Quan Entered By: Alric Quan on 07/06/2017 13:25:57 Adam Merritt ([id number]) -------------------------------------------------------------------------------- [place] Details Patient Name: Adam Merritt Date of Service: 07/06/2017 12:30 PM Medical Record Number: [id number] Patient Account Number:  [id number] Date of Birth/Sex: 1945/10/28 (72  y.o. Male) Treating RN: Carolyne Fiscal, Debi Primary Care Sevana Grandinetti: [name] Other Clinician: Referring Jalan Fariss: [name] Treating Charlies Rayburn/Extender: Melburn Hake, [name]: 14 Vital Signs Time Taken: 13:05 Temperature (F): 97.7 Height (in): 64 Pulse (bpm): 72 Weight (lbs): 298 Respiratory Rate (breaths/min): 20 Body Mass Index (BMI): 51.1 Blood Pressure (mmHg): 175/82 Reference Range: 80 - 120 mg / dl Electronic Signature(s) Signed: 07/06/2017 4:17:47 PM By: Alric Quan Entered By: Alric Quan on 07/06/2017 13:14:51

## 2017-07-08 NOTE — Progress Notes (Addendum)
DAVE, MERGEN ([id number]) Visit Report for 07/06/2017 Chief Complaint Document Details Patient Name: Adam Merritt, Adam Merritt Date of Service: 07/06/2017 12:30 PM Medical Record Number: [id number] Patient Account Number: [id number] Date of Birth/Sex: 08/22/1945 (71 y.o. Male) Treating RN: Ahmed Prima Primary Care Provider: [name] Other Clinician: Referring Provider: [name] Treating Provider/Extender: Melburn Hake, [name]: 14 Information Obtained from: Patient Chief Complaint the patient was recently discharged and now is back for a pressure ulcer on his left heel Electronic Signature(s) Signed: 07/06/2017 4:48:53 PM By: Worthy Keeler PA-C Entered By: Worthy Keeler on 07/06/2017 13:08:06 Adam [facility]itt ([id number]) -------------------------------------------------------------------------------- Debridement Details Patient Name: Adam Merritt Date of Service: 07/06/2017 12:30 PM Medical Record Number: [id number] Patient Account Number: [id number] Date of Birth/Sex: Jul 10, 1945 (72 y.o. Male) Treating RN: Ahmed Prima Primary Care Provider: [name] Other Clinician: Referring Provider: [name] Treating Provider/Extender: Melburn Hake, [name]: 14 Debridement Performed for Wound #8 Left Calcaneus Assessment: Performed By: Physician STONE III, HOYT E., PA-C Debridement: Debridement Severity of Tissue Pre Fat layer exposed Debridement: Pre-procedure Verification/Time Yes - 13:24 Out Taken: Start Time: 13:25 Pain Control: Lidocaine 4% Topical Solution Level: Skin/Subcutaneous Tissue Total Area Debrided (L x W): 0.1 (cm) x 0.1 (cm) = 0.01 (cm) Tissue and other material Viable, Non-Viable, Exudate, Fibrin/Slough, Subcutaneous debrided: Instrument: Curette Bleeding: Minimum Hemostasis Achieved: Pressure End Time: 13:26 Procedural Pain: 0 Post Procedural Pain: 0 Response to Treatment: Procedure was tolerated well Post Debridement  Measurements of Total Wound Length: (cm) 0.2 Stage: Category/Stage III Width: (cm) 0.2 Depth: (cm) 0.2 Volume: (cm) 0.006 Character of Wound/Ulcer Post Requires Further Debridement Debridement: Severity of Tissue Post Debridement: Fat layer exposed Post Procedure Diagnosis Same as Pre-procedure Electronic Signature(s) Signed: 07/06/2017 4:17:47 PM By: Alric Quan Signed: 07/06/2017 4:48:53 PM By: Worthy Keeler PA-C Entered By: Alric Quan on 07/06/2017 13:26:56 Adam [facility]itt ([id number]) -------------------------------------------------------------------------------- HPI Details Patient Name: Adam Merritt Date of Service: 07/06/2017 12:30 PM Medical Record Number: [id number] Patient Account Number: [id number] Date of Birth/Sex: Jul 25, 1945 (72 y.o. Male) Treating RN: Ahmed Prima Primary Care Provider: [name] Other Clinician: Referring Provider: [name] Treating Provider/Extender: STONE III, [name]: 14 History of Present Illness Location: left lower extremity lymphedema with a small open wound which is fairly superficial on his left anterior calf Quality: Patient reports No Pain. Severity: Patient states wound are getting better Duration: his happened only last week Context: The wound appeared gradually over time Modifying Factors: he is being treated at the lymphedema clinic with regular wrapping of his left lower extremity Associated Signs and Symptoms: Patient reports having difficulty standing for long periods. HPI Description: 72 year old gentleman with chronic bilateral lower extremity lymphedema associated with diabetes mellitus and obesity has been very compliant with his management and has been going to the lymphedema clinic in the recent past. He now returns with a small superficial ulcer on the left anterior lower calf about a week's duration He has been under the treatment of the lymphedema clinic for the last month. 04/20/2017  -- the patient was discharged a few weeks ago for lymphedema with ulceration and is under the care of the lymphedema clinic. However he had a fall about 2 weeks ago where he lost consciousness and was laying on the floor for about an hour. During this time he has sustained a pressure injury to the left heel. Of note he has had a vascular consult by Dr. Annamarie Major -- on 09/29/2013  and Dr. Trula Slade opinion was:"I have reviewed his outside ultrasound which shows an ankle-brachial index of 0.72 on the left and 1.0 on the right.o This study was repeated at our office.o The ankle-brachial index on the right was 1.26 with triphasic waveforms.o On the left it was a 1.04 with biphasic waveforms.o The patient had a toe pressure of 121 on the right and 100 on the left. In summary, I would not recommend any further imaging , as I do not think his symptoms are related to arterial insufficiency, " 06/04/17 patient's heel ulcer appears to be doing excellent today. He has been tolerating the dressing changes without complication. No fevers, chills, nausea, or vomiting noted at this time. I am very pleased with the progress he has made since my last evaluation with him. Patient is having no pain. 06/18/17 on evaluation today patient appears to be doing very well in regard to his left heel wound. In fact this appears to be almost healed. There was eschar covering which did require removal today and underlying there did appear to still be a small opening although this is very minimal compared to where he has been. Overall I'm extremely pleased with the progress that he has made. Patient likewise is also pleased. 07/06/17 on evaluation today patient appears to be doing okay in regard to the left heel ulcer. There is still an eschar covering this although it does appear to be softer than previously noted. Last week it was a little bit too firmly attached to attempt removal. Nonetheless I was able to remove this today  without complication and clean all some Slough that was over the wound surface there is still a small wound opening noted at this point. ===== Old notes: 72 year old gentleman seen earlier at our clinic in June 2016 and again in June 2017 is noncompliant with recommendations and does not follow-up regularly. 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 He had referred him to the lymphedema clinic and he was accepted but he did not follow-up with them. At that stage he had agreed to get a repeat arterial study done and he has seen Dr. Trula Slade in [place]. This time around he has been seen by his PCP Dr. Tiburcio Bash who referred him urgently to [place] for bilateral lower extremity cellulitis in spite of being on antibiotics -- he was placed on Keflex and 500 mg 3 times a day for 10 days. His comorbidities include coronary artery disease, diabetes mellitus, hypertension, Bell's palsy and chronic lymphedema. Adam Merritt, Adam Merritt ([id number]) he was also recently admitted to the hospital on April 13 and kept overnight with the diagnosis of a TIA and a left ICA stenosis to follow-up with vascular surgery as an outpatient 10/05/2016 -- patient has been compliant this week and left his compression and dressing on for the entire week and had home health change it appropriately. 11/13/2016 -- his insurance will not cover compression stockings for lymphedema and hence we will have to send him to the lymphedema clinic as soon as we have finished with his healing. 11/20/2016 -- his lymphedema clinic appointment is still pending but other than that he has done very well and we are ready to transition him over. 11/27/16 patient's wounds appeared to be completely healed on evaluation today.  He still has bilateral left  edema and has been referred to the lymphedema clinic although we are still waiting to hear back from them on scheduling. Apparently she has to be discharged from home health services prior to being able to establish with the lymphedema clinic. At this point considering he is completely healed from the standpoint of wounds of the bilateral lower extremities I think it is appropriate to discharge him from home health and initiate weekly wraps on our end. There is no evidence of infection. 01/01/2017 -- the patient was recently discharged on 12/04/2016 and asked to follow-up in the lymphedema clinic. However they seem to be some issues with his going to the lymphedema clinic and then referring him to dermatology for an opinion regarding his skin. Ultimately the patient did not have a follow-up and has now redeveloped wheezing from the right lower extremity and ulceration on the left lower extremity. The patient and his wife are rather helpless and do not seem to be very compliant with their care. 01/08/17 on evaluation today patient has just a single lower extremity wounds noted. Fortunately he is having no significant pain, no bowe or bladder dysfunction, no nausea and no vomiting. There's nothing to indicate a systemic worsening infection. 01/16/17 on a violation today patient appears to be doing fairly well in regard to his bilateral lower extremities. He has a very small ulcer which is barely openable left lower extremity and otherwise is completely closed in regard to both legs. I believe we may be ready to get him back to the lymphedema clinic at this point. ==== Old Notes: 72 year old gentleman who comes with bilateral lower limb edema for over 20 years and recent attack of blisters and redness for about 2 weeks. The patient says he has had bilateral lower limb edema since 1996. He has been noncompliant with wearing wraps in the past and even was given a boot to wear at night which she never  tolerated and didn't wear it. He has a past medical history of sleep apnea, neuropathy with diabetes, hypertension, hyperlipidemia, coronary artery disease, varicose veins, carotid artery disease and is status post CABG and cholecystectomy. He was seen by the vascular surgeon on 09/29/2013 and Dr. Trula Slade opinion was:"I have reviewed his outside ultrasound which shows an ankle-brachial index of 0.72 on the left and 1.0 on the right.o This study was repeated at our office.o The ankle-brachial index on the right was 1.26 with triphasic waveforms.o On the left it was a 1.04 with biphasic waveforms.o The patient had a toe pressure of 121 on the right and 100 on the left. In summary, I would not recommend any further imaging , as I do not think his symptoms are related to arterial insufficiency, but rather secondary to diabetic neuropathy.o The patient still has room for increasing his Neurontin dose, which he states does help him.o I have scheduled him to come back in one year for a repeat arterial evaluation." in June 2016, a year ago, he has a venous duplex study to be done. He was also going to be accepted to the lymphedema clinic but I did not believe the patient went regularly for this. ==== Electronic Signature(s) Signed: 07/06/2017 4:48:53 PM By: Worthy Keeler PA-C Entered By: Worthy Keeler on 07/06/2017 14:06:43 Adam [facility]itt ([id number]) -------------------------------------------------------------------------------- Physical Exam Details Patient Name: Adam Merritt Date of Service: 07/06/2017 12:30 PM Medical Record Number: [id number] Patient Account Number: [id number] Date of Birth/Sex: 07/08/1945 (72 y.o. Male) Treating RN:  Carolyne Fiscal Debi Primary Care Provider: [name] Other Clinician: Referring Provider: [name] Treating Provider/Extender: Melburn Hake, [name]: 29 Constitutional Well-nourished and well-hydrated in no acute distress. Respiratory normal  breathing without difficulty. clear to auscultation bilaterally. Cardiovascular regular rate and rhythm with normal S1, S2. Psychiatric this patient is able to make decisions and demonstrates good insight into disease process. Alert and Oriented x 3. pleasant and cooperative. Notes On evaluation today patient's wound did have eschar covering which was removed utilizing a curette and patient tolerated this with no pain. There was slough on the surface of the wound which also required debride anyway. Fortunately post debridement the wound bed appear to be doing very well. No fevers, chills, nausea, or vomiting noted at this time. There is no purulent discharge. Electronic Signature(s) Signed: 07/06/2017 4:48:53 PM By: Worthy Keeler PA-C Entered By: Worthy Keeler on 07/06/2017 14:08:46 Adam Merritt ([id number]) -------------------------------------------------------------------------------- Physician Orders Details Patient Name: Adam Merritt Date of Service: 07/06/2017 12:30 PM Medical Record Number: [id number] Patient Account Number: [id number] Date of Birth/Sex: 1945/10/03 (72 y.o. 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 o Cleanse wound with mild soap and water o May Shower, gently pat wound dry prior to applying new dressing. Anesthetic (add to  Medication List) Wound #8 Left Calcaneus o Topical Lidocaine 4% cream applied to wound bed prior to debridement (In Clinic Only). Primary Wound Dressing Wound #8 Left Calcaneus o Prisma Ag - moisten with saline Secondary Dressing Wound #8 Left Calcaneus o Dry Gauze o Conform/Kerlix - DO NOT WRAP TIGHT o Foam - Allevyn Heel Cup o Other - stretch netting #5 Dressing Change Frequency Wound #8 Left Calcaneus o Change dressing every other day. Follow-up Appointments o Return Appointment in 1 week. Edema Control Wound #8 Left Calcaneus o Patient to wear own compression stockings Off-Loading KYRILLOS, ADAMS ([id number]) Wound #8 Left Calcaneus o Other: - float heels when lying in bed or sitting Additional Orders / Instructions Wound #8 Left Calcaneus o Increase protein intake. Home Health Wound #8 Left [place] Visits - [facility] Nurse may visit PRN to address patientos wound care needs. o FACE TO FACE ENCOUNTER: MEDICARE and MEDICAID PATIENTS: I certify that this patient is under my care and that I had a face-to-face encounter that meets the physician face-to-face encounter requirements with this patient on this date. The encounter with the patient was in whole or in part for the following MEDICAL CONDITION: (primary reason for [place]) MEDICAL NECESSITY: I certify, that based on my findings, NURSING services are a medically necessary home health service. HOME BOUND STATUS: I certify that my clinical findings support that this patient is homebound (i.e., Due to illness or injury, pt requires aid of supportive devices such as crutches, cane, wheelchairs, walkers, the use of special transportation or the assistance of another person to leave their place of residence. There is a normal inability to leave the home  and doing so requires considerable and taxing effort. Other absences are for medical reasons /  religious services and are infrequent or of short duration when for other reasons). o If current dressing causes regression in wound condition, may D/C ordered dressing product/s and apply Normal Saline Moist Dressing daily until next [place] / Other MD appointment. [place] of regression in wound condition at [phone]. o Please direct any NON-WOUND related issues/requests for orders to patient's Primary Care Physician Patient Medications Allergies: Corticosteroids (Glucocorticoids) Notifications Medication Indication Start End lidocaine DOSE 1 - topical 4 % cream - 1 cream topical Electronic Signature(s) Signed: 07/06/2017 4:17:47 PM By: Alric Quan Signed: 07/06/2017 4:48:53 PM By: Worthy Keeler PA-C Entered By: Alric Quan on 07/06/2017 13:41:03 Adam Merritt ([id number]) -------------------------------------------------------------------------------- Prescription 07/06/2017 Patient Name: Adam Merritt Provider: Worthy Keeler PA-C Date of Birth: January 25, 1946 NPI#: [id number] Sex: M DEA#: [id number] Phone #: [phone] License #: Patient Address: [facility] [place], [place] [address] [address] [place],  [address] [phone] Allergies Corticosteroids (Glucocorticoids) Medication Medication: Route: Strength: Form: lidocaine 4 % topical cream topical 4% cream Class: TOPICAL LOCAL ANESTHETICS Dose: Frequency / Time: Indication: 1 1 cream topical Number of Refills: Number of Units: 0 Generic Substitution: Start Date: End Date: One Time Use: Substitution Permitted No Note to Pharmacy: Signature(s): Date(s): Electronic Signature(s) Signed: 07/06/2017 4:17:47 PM By: Alric Quan Signed: 07/06/2017 4:48:53 PM By: Worthy Keeler PA-C Entered By: Alric Quan on 07/06/2017 13:41:05 Adam Merritt  ([id number]) --------------------------------------------------------------------------------  Problem List Details Patient Name: Adam Merritt Date of Service: 07/06/2017 12:30 PM Medical Record Number: [id number] Patient Account Number: [id number] Date of Birth/Sex: 1945-06-12 (72 y.o. 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 03/26/2017 Yes I89.0 Lymphedema, not elsewhere classified 03/26/2017 Yes E66.01 Morbid (severe) obesity due to excess calories 03/26/2017 Yes L97.222 Non-pressure chronic ulcer of left calf with fat layer exposed 03/26/2017 Yes L89.623 Pressure ulcer of left heel, stage 3 04/20/2017 Yes Inactive Problems Resolved Problems Electronic Signature(s) Signed: 07/06/2017 4:48:53 PM By: Worthy Keeler PA-C Entered By: Worthy Keeler on 07/06/2017 14:12:12 Adam Merritt ([id number]) -------------------------------------------------------------------------------- Progress Note Details Patient Name: Adam Merritt Date of Service: 07/06/2017 12:30 PM Medical Record Number: [id number] Patient Account Number: [id number] Date of Birth/Sex: 1946/05/29 (72 y.o. Male) Treating RN: Ahmed Prima Primary Care Provider: [name] Other Clinician: Referring Provider: [name] Treating Provider/Extender: Melburn Hake, [name]: 14 Subjective Chief Complaint Information obtained from Patient the patient was recently discharged and now is back for a pressure ulcer on his left heel History of Present Illness (HPI) The following HPI elements were documented for the patient's wound: Location: left lower extremity lymphedema with a small open wound which is fairly superficial on his left  anterior calf Quality: Patient reports No Pain. Severity: Patient states wound are getting better Duration: his happened only last week Context: The wound appeared gradually over time Modifying Factors: he is being treated at the lymphedema clinic with regular wrapping of his left lower extremity Associated Signs and Symptoms: Patient reports having difficulty standing for long periods. 72 year old gentleman with chronic bilateral lower extremity lymphedema associated with diabetes mellitus and obesity has been very  compliant with his management and has been going to the lymphedema clinic in the recent past. He now returns with a small superficial ulcer on the left anterior lower calf about a week's duration He has been under the treatment of the lymphedema clinic for the last month. 04/20/2017 -- the patient was discharged a few weeks ago for lymphedema with ulceration and is under the care of the lymphedema clinic. However he had a fall about 2 weeks ago where he lost consciousness and was laying on the floor for about an hour. During this time he has sustained a pressure injury to the left heel. Of note he has had a vascular consult by Dr. Annamarie Major -- on 09/29/2013 and Dr. Trula Slade opinion was:"I have reviewed his outside ultrasound which shows an ankle-brachial index of 0.72 on the left and 1.0 on the right. This study was repeated at our office. The ankle-brachial index on the right was 1.26 with triphasic waveforms. On the left it was a 1.04 with biphasic waveforms. The patient had a toe pressure of 121 on the right and 100 on the left. In summary, I would not recommend any further imaging , as I do not think his symptoms are related to arterial insufficiency, " 06/04/17 patient's heel ulcer appears to be doing excellent today. He has been tolerating the dressing changes without complication. No fevers, chills, nausea, or vomiting noted at this time. I am very pleased with the progress  he has made since my last evaluation with him. Patient is having no pain. 06/18/17 on evaluation today patient appears to be doing very well in regard to his left heel wound. In fact this appears to be almost healed. There was eschar covering which did require removal today and underlying there did appear to still be a small opening although this is very minimal compared to where he has been. Overall I'm extremely pleased with the progress that he has made. Patient likewise is also pleased. 07/06/17 on evaluation today patient appears to be doing okay in regard to the left heel ulcer. There is still an eschar covering this although it does appear to be softer than previously noted. Last week it was a little bit too firmly attached to attempt removal. Nonetheless I was able to remove this today without complication and clean all some Slough that was over the wound surface there is still a small wound opening noted at this point. ===== Old notes: 72 year old gentleman seen earlier at our clinic in June 2016 and again in June 2017 is noncompliant with recommendations Adam Merritt, Adam Merritt. ([id number]) and does not follow-up regularly. 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 He had referred him to the lymphedema clinic and he was accepted but he did not follow-up with them. At that stage he had agreed to get a repeat arterial study done and he has seen Dr. Trula Slade in [place]. This time around he has been seen by his PCP Dr. Tiburcio Bash who referred him urgently to [place] for bilateral lower extremity cellulitis in spite of being on antibiotics -- he was placed on Keflex and 500 mg 3 times a day for 10 days. His comorbidities include coronary artery disease, diabetes mellitus, hypertension, Bell's palsy  and chronic lymphedema. he was also recently admitted  to the hospital on April 13 and kept overnight with the diagnosis of a TIA and a left ICA stenosis to follow-up with vascular surgery as an outpatient 10/05/2016 -- patient has been compliant this week and left his compression and dressing on for the entire week and had home health change it appropriately. 11/13/2016 -- his insurance will not cover compression stockings for lymphedema and hence we will have to send him to the lymphedema clinic as soon as we have finished with his healing. 11/20/2016 -- his lymphedema clinic appointment is still pending but other than that he has done very well and we are ready to transition him over. 11/27/16 patient's wounds appeared to be completely healed on evaluation today. He still has bilateral left edema and has been referred to the lymphedema clinic although we are still waiting to hear back from them on scheduling. Apparently she has to be discharged from home health services prior to being able to establish with the lymphedema clinic. At this point considering he is completely healed from the standpoint of wounds of the bilateral lower extremities I think it is appropriate to discharge him from home health and initiate weekly wraps on our end. There is no evidence of infection. 01/01/2017 -- the patient was recently discharged on 12/04/2016 and asked to follow-up in the lymphedema clinic. However they seem to be some issues with his going to the lymphedema clinic and then referring him to dermatology for an opinion regarding his skin. Ultimately the patient did not have a follow-up and has now redeveloped wheezing from the right lower extremity and ulceration on the left lower extremity. The patient and his wife are rather helpless and do not seem to be very compliant with their care. 01/08/17 on evaluation today patient has just a single lower extremity wounds noted. Fortunately he is having no  significant pain, no bowe or bladder dysfunction, no nausea and no vomiting. There's nothing to indicate a systemic worsening infection. 01/16/17 on a violation today patient appears to be doing fairly well in regard to his bilateral lower extremities. He has a very small ulcer which is barely openable left lower extremity and otherwise is completely closed in regard to both legs. I believe we may be ready to get him back to the lymphedema clinic at this point. ==== Old Notes: 72 year old gentleman who comes with bilateral lower limb edema for over 20 years and recent attack of blisters and redness for about 2 weeks. The patient says he has had bilateral lower limb edema since 1996. He has been noncompliant with wearing wraps in the past and even was given a boot to wear at night which she never tolerated and didn't wear it. He has a past medical history of sleep apnea, neuropathy with diabetes, hypertension, hyperlipidemia, coronary artery disease, varicose veins, carotid artery disease and is status post CABG and cholecystectomy. He was seen by the vascular surgeon on 09/29/2013 and Dr. Trula Slade opinion was:"I have reviewed his outside ultrasound which shows an ankle-brachial index of 0.72 on the left and 1.0 on the right. This study was repeated at our office. The ankle-brachial index on the right was 1.26 with triphasic waveforms. On the left it was a 1.04 with biphasic waveforms. The patient had a toe pressure of 121 on the right and 100 on the left. In summary, I would not recommend any further imaging , as I do not think his symptoms are related to arterial insufficiency, but rather secondary to diabetic neuropathy. The patient  still has room for increasing his Neurontin dose, which he states does help him. I have scheduled him to come back in one year for a repeat arterial evaluation." in June 2016, a year ago, he has a venous duplex study to be done. He was also going to be accepted to the  lymphedema clinic but I did not believe the patient went regularly for this. ==== Adam Merritt, Adam Merritt ([id number]) Patient History Information obtained from Patient. Family History Cancer - Paternal Grandparents, Kidney Disease - Siblings, Lung Disease - Mother, No family history of Diabetes, Heart Disease, Hereditary Spherocytosis, Hypertension, Seizures, Stroke, Thyroid Problems, Tuberculosis. Social History Never smoker, Marital Status - Married, Alcohol Use - Never, Drug Use - No History, Caffeine Use - Never. Medical History Hospitalization/Surgery History - 11/03/2013, [facility], bronchitis. Medical And Surgical History Notes Cardiovascular varicose veins Neurologic CVA - 1992 Review of Systems (ROS) Constitutional Symptoms (General Health) Denies complaints or symptoms of Fever, Chills. Respiratory The patient has no complaints or symptoms. Cardiovascular Complains or has symptoms of LE edema. Psychiatric The patient has no complaints or symptoms. Objective Constitutional Well-nourished and well-hydrated in no acute distress. Vitals Time Taken: 1:05 PM, Height: 64 in, Weight: 298 lbs, BMI: 51.1, Temperature: 97.7 F, Pulse: 72 bpm, Respiratory Rate: 20 breaths/min, Blood Pressure: 175/82 mmHg. Respiratory normal breathing without difficulty. clear to auscultation bilaterally. Cardiovascular regular rate and rhythm with normal S1, S2. Psychiatric this patient is able to make decisions and demonstrates good insight into disease process. Alert and Oriented x 3. pleasant and cooperative. General Notes: On evaluation today patient's wound did have eschar covering which was removed utilizing a curette and Adam Merritt, Adam Merritt. ([id number]) patient tolerated this with no pain. There was slough on the surface of the wound which also required debride anyway. Fortunately post debridement the wound bed appear to be doing very well. No fevers, chills, nausea, or vomiting noted at this time.  There is no purulent discharge. Integumentary (Hair, Skin) Wound #8 status is Open. Original cause of wound was Pressure Injury. The wound is located on the Left Calcaneus. The wound measures 0.1cm length x 0.1cm width x 0.1cm depth; 0.008cm^2 area and 0.001cm^3 volume. There is Fat Layer (Subcutaneous Tissue) Exposed exposed. There is no tunneling or undermining noted. There is a medium amount of serous drainage noted. The wound margin is flat and intact. There is no granulation within the wound bed. There is a large (67-100%) amount of necrotic tissue within the wound bed including Eschar. The periwound skin appearance did not exhibit: Callus, Crepitus, Excoriation, Induration, Rash, Scarring, Dry/Scaly, Maceration, Atrophie Blanche, Cyanosis, Ecchymosis, Hemosiderin Staining, Mottled, Pallor, Rubor, Erythema. Periwound temperature was noted as No Abnormality. The periwound has tenderness on palpation. 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 There was a Skin/Subcutaneous Tissue Debridement ([id number]) debridement with total area of 0.01 sq cm performed by STONE III, HOYT E., PA-C. with the following instrument(s): Curette to remove Viable and Non- Viable tissue/material including Exudate, Fibrin/Slough, and Subcutaneous after achieving pain control using Lidocaine 4% Topical Solution. A time out was conducted at 13:24, prior to the start of  the procedure. A Minimum amount of bleeding was controlled with Pressure. The procedure was tolerated well with a  pain level of 0 throughout and a pain level of 0 following the procedure. Post Debridement Measurements: 0.2cm length x 0.2cm width x 0.2cm depth; 0.006cm^3 volume. Post debridement Stage noted as Category/Stage III. Character of Wound/Ulcer Post Debridement requires further debridement. Severity of Tissue Post Debridement is: Fat layer exposed. Post procedure Diagnosis Wound #8: Same as Pre-Procedure Plan Wound Cleansing: Wound #8 Left Calcaneus: Clean wound with Normal Saline. Adam Merritt, Adam Merritt ([id number]) Cleanse wound with mild soap and water May Shower, gently pat wound dry prior to applying new dressing. Anesthetic (add to Medication List): Wound #8 Left Calcaneus: Topical Lidocaine 4% cream applied to wound bed prior to debridement (In Clinic Only). Primary Wound Dressing: Wound #8 Left Calcaneus: Prisma Ag - moisten with saline Secondary Dressing: Wound #8 Left Calcaneus: Dry Gauze Conform/Kerlix - DO NOT WRAP TIGHT Foam - Allevyn Heel Cup Other - stretch netting #5 Dressing Change Frequency: Wound #8 Left Calcaneus: Change dressing every other day. Follow-up Appointments: Return Appointment in 1 week. Edema Control: Wound #8 Left Calcaneus: Patient to wear own compression stockings Off-Loading: Wound #8 Left Calcaneus: Other: - float heels when lying in bed or sitting Additional Orders / Instructions: Wound #8 Left Calcaneus: Increase protein intake. Home Health: Wound #8 Left Calcaneus: Continue Home Health Visits - [place] Nurse may visit PRN to address patient s wound care needs. FACE TO FACE ENCOUNTER: MEDICARE and MEDICAID PATIENTS: I certify that this patient is under my care and that I had a face-to-face encounter that meets the physician face-to-face encounter requirements with this patient on this date. The encounter with the patient was in whole or in part for the following MEDICAL CONDITION: (primary reason for [place]) MEDICAL  NECESSITY: I certify, that based on my findings, NURSING services are a medically necessary home health service. HOME BOUND STATUS: I certify that my clinical findings support that this patient is homebound (i.e., Due to illness or injury, pt requires aid of supportive devices such as crutches, cane, wheelchairs, walkers, the use of special transportation or the assistance of another person to leave their place of residence. There is a normal inability to leave the home and doing so requires considerable and taxing effort. Other absences are for medical reasons / religious services and are infrequent or of short duration when for other reasons). If current dressing causes regression in wound condition, may D/C ordered dressing product/s and apply Normal Saline Moist Dressing daily until next [place] / Other MD appointment. Weed of regression in wound condition at [phone]. Please direct any NON-WOUND related issues/requests for orders to patient's Primary Care Physician The following medication(s) was prescribed: lidocaine topical 4 % cream 1 1 cream topical was prescribed at facility I am going to recommend that we continue with the Current wound care measures per above including adding Prisma to help hopefully with closure of the wound more rapidly. Patient is in agreement with this plan. We will see him for reevaluation in two weeks time to see were things stand. Hopefully this will be close to that point if not at least significantly smaller patient is in agreement with this plan. Adam Merritt, Adam Merritt ([id number]) Please see above for specific wound care orders. We will see patient for re-evaluation in 1 week(s) here in the clinic. If anything worsens or changes patient will contact our office for additional recommendations. Electronic  Signature(s) Signed: 07/24/2017 10:42:09 AM By: Worthy Keeler PA-C Previous Signature: 07/06/2017 4:48:53 PM Version By: Worthy Keeler PA-C Entered By: Worthy Keeler on 07/24/2017 08:15:15 Adam Merritt ([id number]) -------------------------------------------------------------------------------- ROS/PFSH Details Patient Name: Adam Merritt Date of Service: 07/06/2017 12:30 PM Medical Record Number: [id number] Patient Account Number: [id number] Date of Birth/Sex: 1945/07/26 (72 y.o. Male) Treating RN: Ahmed Prima Primary Care Provider: [name] Other Clinician: Referring Provider: [name] Treating Provider/Extender: Melburn Hake, [name]: 14 Information Obtained From Patient Wound History Do you currently have one or more open woundso Yes How many open wounds do you currently haveo 1 Approximately how long have you had your woundso 10 days How have you been treating your wound(s) until nowo dry dressing Has your wound(s) ever healed and then re-openedo No Have you had any lab work done in the past montho No Have you tested positive for an antibiotic resistant organism (MRSA, VRE)o No Have you tested positive for osteomyelitis (bone infection)o No Have you had any tests for circulation on your legso Yes Where was the test doneo [facility] Constitutional Symptoms (General Health) Complaints and Symptoms: Negative for: Fever; Chills Cardiovascular Complaints and Symptoms: Positive for: LE edema Medical History: Positive for: Congestive Heart Failure; Coronary Artery Disease; Hypertension; Peripheral Venous Disease Negative for: Angina; Arrhythmia; Deep Vein Thrombosis; Hypotension; Myocardial Infarction; Peripheral Arterial Disease; Phlebitis; Vasculitis Past Medical History Notes: varicose veins Eyes Medical History: Negative for: Cataracts; Glaucoma; Optic Neuritis Ear/Nose/Mouth/Throat Medical History: Negative for: Chronic sinus problems/congestion; Middle ear problems Hematologic/Lymphatic Medical History: Positive for: Anemia - aplastic anemia; Lymphedema Negative for:  Hemophilia; Human Immunodeficiency Virus; Sickle Cell Disease Respiratory Adam Merritt, Adam Merritt. ([id number]) Complaints and Symptoms: No Complaints or Symptoms Medical History: Positive for: Chronic Obstructive Pulmonary Disease (COPD); Sleep Apnea Negative for: Aspiration; Asthma; Pneumothorax; Tuberculosis Gastrointestinal Medical History: Negative for: Cirrhosis ; Colitis; Crohnos; Hepatitis A; Hepatitis B; Hepatitis C Endocrine Medical History: Positive for: Type II Diabetes Negative for: Type I Diabetes Time with diabetes: since 2006 Treated with: Insulin Blood sugar tested every day: Yes Tested : Blood sugar testing results: Breakfast: 209 Genitourinary Medical History: Negative for: End Stage Renal Disease Immunological Medical History: Negative for: Lupus Erythematosus; Raynaudos; Scleroderma Integumentary (Skin) Medical History: Negative for: History of Burn; History of pressure wounds Musculoskeletal Medical History: Positive for: Osteoarthritis Negative for: Gout; Rheumatoid Arthritis; Osteomyelitis Neurologic Medical History: Positive for: Neuropathy Negative for: Dementia; Quadriplegia; Paraplegia; Seizure Disorder Past Medical History Notes: CVA - 1992 Oncologic Medical History: Positive for: Received Chemotherapy - last dose 2006 Negative for: Received Radiation Adam Merritt, Adam Merritt ([id number]) Psychiatric Complaints and Symptoms: No Complaints or Symptoms Medical History: Negative for: Anorexia/bulimia; Confinement Anxiety Immunizations Pneumococcal Vaccine: Received Pneumococcal Vaccination: Yes Implantable Devices Hospitalization / Surgery History Name of Hospital Purpose of Hospitalization/Surgery Date [facility] bronchitis 11/03/2013 Family and Social History Cancer: Yes - Paternal Grandparents; Diabetes: No; Heart Disease: No; Hereditary Spherocytosis: No; Hypertension: No; Kidney Disease: Yes - Siblings; Lung Disease: Yes - Mother; Seizures: No; Stroke:  No; Thyroid Problems: No; Tuberculosis: No; Never smoker; Marital Status - Married; Alcohol Use: Never; Drug Use: No History; Caffeine Use: Never; Financial Concerns: No; Food, Clothing or Shelter Needs: No; Support System Lacking: No; Transportation Concerns: No; Advanced Directives: No; Patient does not want information on Advanced Directives Physician Affirmation I have reviewed and agree with the above information. Electronic Signature(s) Signed: 07/06/2017 4:17:47 PM By: Alric Quan Signed: 07/06/2017 4:48:53 PM By: Worthy Keeler PA-C Entered By: Melburn Hake,  Hoyt on 07/06/2017 14:07:15 Adam Merritt, Adam Merritt ([id number]) -------------------------------------------------------------------------------- SuperBill Details Patient Name: Adam Merritt, Adam Merritt. Date of Service: 07/06/2017 Medical Record Number: [id number] Patient Account Number: [id number] Date of Birth/Sex: Feb 05, 1946 (71 y.o. 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 I89.0 Lymphedema, not elsewhere classified E66.01 Morbid (severe) obesity due to excess calories L97.222 Non-pressure chronic ulcer of left calf with fat layer exposed L89.623 Pressure ulcer of left heel, stage 3 Facility Procedures CPT4 Code: 24469507 Description: 22575 - DEB SUBQ TISSUE 20 SQ CM/< ICD-10 Diagnosis Description L97.222 Non-pressure chronic ulcer of left calf with fat layer expo Modifier: sed Quantity: 1 Physician Procedures CPT4 Code: [id number] Description: 82518 - WC PHYS SUBQ TISS 20 SQ CM ICD-10 Diagnosis Description L97.222 Non-pressure chronic ulcer of left calf with fat layer expo Modifier: sed Quantity: 1 Electronic Signature(s) Signed: 07/06/2017 4:48:53 PM By: Worthy Keeler PA-C Entered By:  Worthy Keeler on 07/06/2017 14:13:11

## 2017-07-09 ENCOUNTER — Other Ambulatory Visit: Payer: Self-pay

## 2017-07-09 ENCOUNTER — Ambulatory Visit (INDEPENDENT_AMBULATORY_CARE_PROVIDER_SITE_OTHER): Payer: Medicare Other | Admitting: Nurse Practitioner

## 2017-07-09 ENCOUNTER — Encounter: Payer: Self-pay | Admitting: Nurse Practitioner

## 2017-07-09 ENCOUNTER — Emergency Department: Payer: Medicare Other

## 2017-07-09 ENCOUNTER — Encounter: Payer: Self-pay | Admitting: Emergency Medicine

## 2017-07-09 ENCOUNTER — Emergency Department
Admission: EM | Admit: 2017-07-09 | Discharge: 2017-07-09 | Disposition: A | Discharge status: Home / Self Care | Payer: Medicare Other | Attending: Emergency Medicine | Admitting: Emergency Medicine

## 2017-07-09 VITALS — BP 150/99 | HR 64 | Temp 97.1°F | Resp 16 | Ht 67.0 in | Wt 295.2 lb

## 2017-07-09 DIAGNOSIS — Z8673 Personal history of transient ischemic attack (TIA), and cerebral infarction without residual deficits: Secondary | ICD-10-CM | POA: Insufficient documentation

## 2017-07-09 DIAGNOSIS — I130 Hypertensive heart and chronic kidney disease with heart failure and stage 1 through stage 4 chronic kidney disease, or unspecified chronic kidney disease: Secondary | ICD-10-CM | POA: Insufficient documentation

## 2017-07-09 DIAGNOSIS — R112 Nausea with vomiting, unspecified: Secondary | ICD-10-CM

## 2017-07-09 DIAGNOSIS — R110 Nausea: Secondary | ICD-10-CM | POA: Diagnosis not present

## 2017-07-09 DIAGNOSIS — N183 Chronic kidney disease, stage 3 (moderate): Secondary | ICD-10-CM | POA: Insufficient documentation

## 2017-07-09 DIAGNOSIS — I10 Essential (primary) hypertension: Secondary | ICD-10-CM | POA: Diagnosis not present

## 2017-07-09 DIAGNOSIS — I5032 Chronic diastolic (congestive) heart failure: Secondary | ICD-10-CM | POA: Insufficient documentation

## 2017-07-09 DIAGNOSIS — Z7982 Long term (current) use of aspirin: Secondary | ICD-10-CM | POA: Diagnosis not present

## 2017-07-09 DIAGNOSIS — Z79899 Other long term (current) drug therapy: Secondary | ICD-10-CM | POA: Insufficient documentation

## 2017-07-09 DIAGNOSIS — Z7902 Long term (current) use of antithrombotics/antiplatelets: Secondary | ICD-10-CM | POA: Insufficient documentation

## 2017-07-09 DIAGNOSIS — R5381 Other malaise: Secondary | ICD-10-CM | POA: Insufficient documentation

## 2017-07-09 DIAGNOSIS — E1165 Type 2 diabetes mellitus with hyperglycemia: Secondary | ICD-10-CM

## 2017-07-09 DIAGNOSIS — I2510 Atherosclerotic heart disease of native coronary artery without angina pectoris: Secondary | ICD-10-CM | POA: Diagnosis not present

## 2017-07-09 DIAGNOSIS — E1122 Type 2 diabetes mellitus with diabetic chronic kidney disease: Secondary | ICD-10-CM | POA: Insufficient documentation

## 2017-07-09 DIAGNOSIS — J449 Chronic obstructive pulmonary disease, unspecified: Secondary | ICD-10-CM | POA: Diagnosis not present

## 2017-07-09 DIAGNOSIS — Z7984 Long term (current) use of oral hypoglycemic drugs: Secondary | ICD-10-CM | POA: Diagnosis not present

## 2017-07-09 DIAGNOSIS — R5383 Other fatigue: Secondary | ICD-10-CM

## 2017-07-09 LAB — URINALYSIS, COMPLETE (UACMP) WITH MICROSCOPIC
Bilirubin Urine: NEGATIVE
GLUCOSE, UA: NEGATIVE mg/dL
HGB URINE DIPSTICK: NEGATIVE
Ketones, ur: NEGATIVE mg/dL
Leukocytes, UA: NEGATIVE
Nitrite: NEGATIVE
PH: 6 (ref 5.0–8.0)
PROTEIN: NEGATIVE mg/dL
Specific Gravity, Urine: 1.01 (ref 1.005–1.030)

## 2017-07-09 LAB — CBC
HCT: 43.4 % (ref 40.0–52.0)
HEMOGLOBIN: 14.8 g/dL (ref 13.0–18.0)
MCH: 32.4 pg (ref 26.0–34.0)
MCHC: 34.2 g/dL (ref 32.0–36.0)
MCV: 94.9 fL (ref 80.0–100.0)
Platelets: 185 10*3/uL (ref 150–440)
RBC: 4.58 MIL/uL (ref 4.40–5.90)
RDW: 13.5 % (ref 11.5–14.5)
WBC: 9.1 10*3/uL (ref 3.8–10.6)

## 2017-07-09 LAB — COMPREHENSIVE METABOLIC PANEL
ALBUMIN: 4 g/dL (ref 3.5–5.0)
ALT: 26 U/L (ref 17–63)
AST: 33 U/L (ref 15–41)
Alkaline Phosphatase: 73 U/L (ref 38–126)
Anion gap: 11 (ref 5–15)
BILIRUBIN TOTAL: 1 mg/dL (ref 0.3–1.2)
BUN: 22 mg/dL — AB (ref 6–20)
CO2: 25 mmol/L (ref 22–32)
Calcium: 9.4 mg/dL (ref 8.9–10.3)
Chloride: 100 mmol/L — ABNORMAL LOW (ref 101–111)
Creatinine, Ser: 1.75 mg/dL — ABNORMAL HIGH (ref 0.61–1.24)
GFR calc Af Amer: 43 mL/min — ABNORMAL LOW (ref >60)
GFR calc non Af Amer: 37 mL/min — ABNORMAL LOW (ref >60)
GLUCOSE: 107 mg/dL — AB (ref 65–99)
POTASSIUM: 3.8 mmol/L (ref 3.5–5.1)
Sodium: 136 mmol/L (ref 135–145)
TOTAL PROTEIN: 8.1 g/dL (ref 6.5–8.1)

## 2017-07-09 LAB — POCT CBG (FASTING - GLUCOSE)-MANUAL ENTRY: Glucose Fasting, POC: 104 mg/dL — AB (ref 70–99)

## 2017-07-09 LAB — TROPONIN I: Troponin I: <0.03 ng/mL (ref <0.03)

## 2017-07-09 LAB — LIPASE, BLOOD: Lipase: 29 U/L (ref 11–51)

## 2017-07-09 MED ORDER — ONDANSETRON 4 MG PO TBDP: 4.0000 mg | ORAL_TABLET | Freq: Three times a day (TID) | ORAL | 0 refills | Status: DC | PRN

## 2017-07-09 MED ORDER — SODIUM CHLORIDE 0.9 % IV BOLUS (SEPSIS)
1000.0000 mL | Freq: Once | INTRAVENOUS | Status: AC
  Administered 2017-07-09: 1000 mL via INTRAVENOUS

## 2017-07-09 MED ORDER — ONDANSETRON HCL 8 MG PO TABS: 8.0000 mg | ORAL_TABLET | Freq: Three times a day (TID) | ORAL | 1 refills | Status: DC | PRN

## 2017-07-09 MED ORDER — ONDANSETRON HCL 4 MG/2ML IJ SOLN
4.0000 mg | Freq: Once | INTRAMUSCULAR | Status: AC
  Administered 2017-07-09: 4 mg via INTRAVENOUS
  Filled 2017-07-09: qty 2

## 2017-07-09 NOTE — ED Provider Notes (Signed)
[facility] Emergency Department Provider Note   First MD Initiated Contact with Patient 07/09/17 1535     (approximate)  I have reviewed the triage vital signs and the nursing notes.   HISTORY  Chief Complaint Nausea    HPI ANTONE SUMMONS is a 72 y.o. male with below list of chronic medical conditions presents with "possible dehydration secondary to persistent nausea times 4 days. Patient denies any abdominal pain or vomiting. Patient denies any diarrhea or fever. Patient denies any urinary symptoms.   Past Medical History:  Diagnosis Date  . Anemia   . CAD (coronary artery disease)   . Chest pain   . Coronary artery disease   . Diabetes mellitus without complication ([place])   . Esophageal reflux   . Herpes zoster without mention of complication   . Hyperlipidemia   . Hypertension   . Insomnia   . Lumbago   . Neuropathy in diabetes ([place])   . Obstructive chronic bronchitis without exacerbation ([place])   . Other malaise and fatigue   . Stroke ([place])   . Varicose veins     Patient Active Problem List   Diagnosis Date Noted  . Other malaise and fatigue 07/09/2017  . CVA (cerebral vascular accident) ([place]) 09/15/2016  . 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 CVA (cerebral infarction) 02/12/2015  . Chronic diastolic CHF (congestive heart failure), NYHA class 3 ([place]) 11/30/2014  . Benign essential hypertension 11/06/2014  . CAD (coronary artery disease) of artery bypass graft 01/19/2014  . CKD (chronic kidney disease), stage III ([place]) 01/19/2014  . COPD (chronic obstructive pulmonary disease) ([place]) 01/19/2014  . Diabetic neuropathy ([place]) 01/19/2014  . Insulin dependent diabetes mellitus ([place]) 01/19/2014  . OSA (obstructive sleep apnea) 01/19/2014  . Mixed hyperlipidemia 10/29/2013  . Peripheral vascular disease, unspecified ([place]) 09/29/2013    Past Surgical History:  Procedure Laterality Date  . CHOLECYSTECTOMY    . CORONARY ARTERY BYPASS  GRAFT      Prior to Admission medications   Medication Sig Start Date End Date Taking? Authorizing Provider  albuterol (PROVENTIL HFA;VENTOLIN HFA) 108 (90 BASE) MCG/ACT inhaler Inhale 1 puff into the lungs every 6 (six) hours as needed for wheezing or shortness of breath.    [provider]  aspirin EC 81 MG EC tablet Take 1 tablet (81 mg total) by mouth daily. 09/16/16   Fritzi Mandes, MD  atorvastatin (LIPITOR) 40 MG tablet Take 20 mg by mouth at bedtime.     [provider]  carvedilol (COREG) 25 MG tablet Take 25 mg by mouth 2 (two) times daily with a meal.    [provider]  clobetasol ointment (TEMOVATE) 2.69 % Apply 1 application topically 2 (two) times daily. Pt applies to lower legs.    [provider]  clopidogrel (PLAVIX) 75 MG tablet Take 1 tablet (75 mg total) by mouth daily. 09/16/16   Fritzi Mandes, MD  fentaNYL (DURAGESIC - DOSED MCG/HR) 50 MCG/HR Place 1 patch (50 mcg total) onto the skin every 3 (three) days. 06/29/17   Ronnell Freshwater, NP  gabapentin (NEURONTIN) 100 MG capsule Take 200 mg by mouth 3 (three) times daily.     [provider]  hydrALAZINE (APRESOLINE) 25 MG tablet Take 25 mg by mouth 3 (three) times daily.    [provider]  hydrocortisone 2.5 % cream Apply topically 2 (two) times daily.    [provider]  ibuprofen (ADVIL,MOTRIN) 600 MG tablet Take 600 mg by  mouth 2 (two) times daily as needed.    [provider]  ipratropium-albuterol (DUONEB) 0.5-2.5 (3) MG/3ML SOLN Take 3 mLs by nebulization every 4 (four) hours as needed (for shortness of breath).    [provider]  Liraglutide (VICTOZA) 18 MG/3ML SOPN Inject 1.8 mg into the skin daily.     [provider]  metolazone (ZAROXOLYN) 2.5 MG tablet Take 2.5 mg by mouth daily.    [provider]  montelukast (SINGULAIR) 10 MG tablet Take 10 mg by mouth daily as needed (for allergies).     [provider]    Multiple Vitamin (MULTIVITAMIN WITH MINERALS) TABS tablet Take 1 tablet by mouth daily.    [provider]  omeprazole (PRILOSEC) 20 MG capsule Take 20 mg by mouth daily.    [provider]  ondansetron (ZOFRAN) 8 MG tablet Take 1 tablet (8 mg total) by mouth every 8 (eight) hours as needed for nausea or vomiting. 07/09/17   Ronnell Freshwater, NP  Oxycodone HCl 10 MG TABS Take 1 tablet (10 mg total) by mouth 3 (three) times daily as needed. 06/29/17   Ronnell Freshwater, NP  pentoxifylline (TRENTAL) 400 MG CR tablet Take 400 mg by mouth 3 (three) times daily.    [provider]  predniSONE (DELTASONE) 10 MG tablet Take 1 tablet (10 mg total) by mouth daily with breakfast. 02/13/15   Henreitta Leber, MD  zolpidem (AMBIEN CR) 12.5 MG CR tablet Take 1 tablet (12.5 mg total) by mouth at bedtime as needed for sleep. 06/29/17   Ronnell Freshwater, NP    Allergies No known drug allergies  Family History  Problem Relation Age of Onset  . Diabetes Mother   . Hyperlipidemia Mother   . Hypertension Mother   . Diabetes Father   . Hyperlipidemia Father   . Hypertension Father     Social History Social History   Tobacco Use  . Smoking status: Never Smoker  . Smokeless tobacco: Never Used  Substance Use Topics  . Alcohol use: No  . Drug use: No    Review of Systems Constitutional: No fever/chills Eyes: No visual changes. ENT: No sore throat. Cardiovascular: Denies chest pain. Respiratory: Denies shortness of breath. Gastrointestinal: No abdominal pain.  Positive for nausea, no vomiting.  No diarrhea.  No constipation. Genitourinary: Negative for dysuria. Musculoskeletal: Negative for neck pain.  Negative for back pain. Integumentary: Negative for rash. Neurological: Negative for headaches, focal weakness or numbness.   ____________________________________________   PHYSICAL EXAM:  VITAL SIGNS: ED Triage Vitals  Enc Vitals Group     BP 07/09/17 1311 (!)  162/87     Pulse Rate 07/09/17 1311 77     Resp 07/09/17 1311 16     Temp 07/09/17 1311 97.8 F (36.6 C)     Temp Source 07/09/17 1311 Oral     SpO2 07/09/17 1311 97 %     Weight 07/09/17 1309 133.8 kg (295 lb)     Height 07/09/17 1309 1.702 m (5\' 7" )     Head Circumference --      Peak Flow --      Pain Score 07/09/17 1309 0     Pain Loc --      Pain Edu? --      Excl. in [place]? --     Constitutional: Alert and oriented. Well appearing and in no acute distress. Eyes: Conjunctivae are normal.  Head: Atraumatic. Mouth/Throat: Mucous membranes are dry.  Oropharynx  non-erythematous. Neck: No stridor.   Cardiovascular: Normal rate, regular rhythm. Good peripheral circulation. Grossly normal heart sounds. Respiratory: Normal respiratory effort.  No retractions. Lungs CTAB. Gastrointestinal: Soft and nontender. No distention.  Musculoskeletal: No lower extremity tenderness nor edema. No gross deformities of extremities. Neurologic:  Normal speech and language. No gross focal neurologic deficits are appreciated.  Skin:  Skin is warm, dry and intact. No rash noted. Psychiatric: Mood and affect are normal. Speech and behavior are normal.  ____________________________________________   LABS (all labs ordered are listed, but only abnormal results are displayed)  Labs Reviewed  COMPREHENSIVE METABOLIC PANEL - Abnormal; Notable for the following components:      Result Value   Chloride 100 (*)    Glucose, Bld 107 (*)    BUN 22 (*)    Creatinine, Ser 1.75 (*)    GFR calc non Af Amer 37 (*)    GFR calc Af Amer 43 (*)    All other components within normal limits  URINALYSIS, COMPLETE (UACMP) WITH MICROSCOPIC - Abnormal; Notable for the following components:   Color, Urine YELLOW (*)    APPearance CLEAR (*)    All other components within normal limits  LIPASE, BLOOD  TROPONIN I  CBC    Procedures   ____________________________________________   INITIAL IMPRESSION / ASSESSMENT  AND PLAN / ED COURSE  As part of my medical decision making, I reviewed the following data within the electronic MEDICAL RECORD NUMBER 72 year old male presenting with above history and physical exam secondary to nausea. Lab data revealed elevated creatinine & BUN most likely secondary to dehydration. Plan to perform a CT abdomen however patient refused to have CT performed. Mr Reaume received IV NS 1 liter and left the ED AMA. I encouraged him to have urther evaluation performed by his PCP ____________________________________________  FINAL CLINICAL IMPRESSION(S) / ED DIAGNOSES  Final diagnoses:  Nausea     MEDICATIONS GIVEN DURING THIS VISIT:  Medications - No data to display   ED Discharge Orders    None       Note:  This document was prepared using Dragon voice recognition software and may include unintentional dictation errors.    Gregor Hams, MD 07/09/17 [id number]

## 2017-07-09 NOTE — ED Notes (Addendum)
Pt states N&V x 5 days. States no longer vomiting d/t not eating/drinking x 5 days. States nothing left to throw up. Saw PCP and was sent here. States constipation. States has had BM and is longer constipated. Denies fever. Denies pain other than chronic pain. States still nauseous. No gallbladder.

## 2017-07-09 NOTE — Progress Notes (Addendum)
[facility] [place], [place] [id number]  Internal MEDICINE  Office Visit Note  Patient Name: Adam Merritt  [id number]  [id number]  Date of Service: 07/09/2017  Chief Complaint  Patient presents with  . Follow-up    1 month, medications    The patient is currently on pain medication regimen. This consists of fentanyl patch at 50mcg/hour. He is using oxycodone 10mg  twice daily when needed for breakthrough pain. He does state that he continues to have moderate pain in both lower legs and feet, however, the pain is manageable and he is overall doing well. Today, he has no new concerns or complaints.    Other  This is a chronic problem. The current episode started more than 1 year ago. The problem occurs constantly. The problem has been gradually improving. Associated symptoms include arthralgias, fatigue, joint swelling, myalgias, a rash and weakness. Pertinent negatives include no abdominal pain, chest pain, chills, congestion, coughing, nausea, neck pain, numbness, sore throat or vomiting. The symptoms are aggravated by stress and exertion. He has tried position changes (oral ad transdermal pain medication. ) for the symptoms. The treatment provided moderate relief.    Pt is here for routine follow up.    Current Medication: Outpatient Encounter Medications as of 06/29/2017  Medication Sig  . albuterol (PROVENTIL HFA;VENTOLIN HFA) 108 (90 BASE) MCG/ACT inhaler Inhale 1 puff into the lungs every 6 (six) hours as needed for wheezing or shortness of breath.  Marland Kitchen aspirin EC 81 MG EC tablet Take 1 tablet (81 mg total) by mouth daily.  Marland Kitchen atorvastatin (LIPITOR) 40 MG tablet Take 20 mg by mouth at bedtime.   . carvedilol (COREG) 25 MG tablet Take 25 mg by mouth 2 (two) times daily with a meal.  . clobetasol ointment (TEMOVATE) 8.52 % Apply 1 application topically 2 (two) times daily. Pt applies to lower legs.  . clopidogrel (PLAVIX) 75 MG tablet Take 1 tablet (75 mg  total) by mouth daily.  . fentaNYL (DURAGESIC - DOSED MCG/HR) 50 MCG/HR Place 1 patch (50 mcg total) onto the skin every 3 (three) days.  Marland Kitchen gabapentin (NEURONTIN) 100 MG capsule Take 200 mg by mouth 3 (three) times daily.   . hydrALAZINE (APRESOLINE) 25 MG tablet Take 25 mg by mouth 3 (three) times daily.  . hydrocortisone 2.5 % cream Apply topically 2 (two) times daily.  Marland Kitchen ibuprofen (ADVIL,MOTRIN) 600 MG tablet Take 600 mg by mouth 2 (two) times daily as needed.  Marland Kitchen ipratropium-albuterol (DUONEB) 0.5-2.5 (3) MG/3ML SOLN Take 3 mLs by nebulization every 4 (four) hours as needed (for shortness of breath).  . Liraglutide (VICTOZA) 18 MG/3ML SOPN Inject 1.8 mg into the skin daily.   . metolazone (ZAROXOLYN) 2.5 MG tablet Take 2.5 mg by mouth daily.  . montelukast (SINGULAIR) 10 MG tablet Take 10 mg by mouth daily as needed (for allergies).   . Multiple Vitamin (MULTIVITAMIN WITH MINERALS) TABS tablet Take 1 tablet by mouth daily.  Marland Kitchen omeprazole (PRILOSEC) 20 MG capsule Take 20 mg by mouth daily.  . Oxycodone HCl 10 MG TABS Take 1 tablet (10 mg total) by mouth 3 (three) times daily as needed.  . pentoxifylline (TRENTAL) 400 MG CR tablet Take 400 mg by mouth 3 (three) times daily.  . predniSONE (DELTASONE) 10 MG tablet Take 1 tablet (10 mg total) by mouth daily with breakfast.  . zolpidem (AMBIEN CR) 12.5 MG CR tablet Take 1 tablet (12.5 mg total) by mouth at bedtime as needed  for sleep.  . [DISCONTINUED] fentaNYL (DURAGESIC - DOSED MCG/HR) 25 MCG/HR patch 1 patch every 3 (three) days.  . [DISCONTINUED] Oxycodone HCl 10 MG TABS Take 0.5-1 tablets by mouth 3 (three) times daily as needed.  . [DISCONTINUED] zolpidem (AMBIEN CR) 12.5 MG CR tablet Take 12.5 mg by mouth at bedtime as needed for sleep.   No facility-administered encounter medications on file as of 06/29/2017.     Surgical History: Past Surgical History:  Procedure Laterality Date  . CHOLECYSTECTOMY    . CORONARY ARTERY BYPASS GRAFT       Medical History: Past Medical History:  Diagnosis Date  . Anemia   . CAD (coronary artery disease)   . Chest pain   . Coronary artery disease   . Diabetes mellitus without complication ([place])   . Esophageal reflux   . Herpes zoster without mention of complication   . Hyperlipidemia   . Hypertension   . Insomnia   . Lumbago   . Neuropathy in diabetes ([place])   . Obstructive chronic bronchitis without exacerbation ([place])   . Other malaise and fatigue   . Stroke ([place])   . Varicose veins     Family History: Family History  Problem Relation Age of Onset  . Diabetes Mother   . Hyperlipidemia Mother   . Hypertension Mother   . Diabetes Father   . Hyperlipidemia Father   . Hypertension Father     Social History   Socioeconomic History  . Marital status: Married    Spouse name: Not on file  . Number of children: Not on file  . Years of education: Not on file  . Highest education level: Not on file  Social Needs  . Financial resource strain: Not on file  . Food insecurity - worry: Not on file  . Food insecurity - inability: Not on file  . Transportation needs - medical: Not on file  . Transportation needs - non-medical: Not on file  Occupational History  . Not on file  Tobacco Use  . Smoking status: Never Smoker  . Smokeless tobacco: Never Used  Substance and Sexual Activity  . Alcohol use: No  . Drug use: No  . Sexual activity: Not on file  Other Topics Concern  . Not on file  Social History Narrative  . Not on file      Review of Systems  Constitutional: Positive for fatigue. Negative for activity change, chills and unexpected weight change.  HENT: Negative for congestion, postnasal drip, rhinorrhea, sneezing and sore throat.   Eyes: Negative.  Negative for redness.  Respiratory: Positive for shortness of breath. Negative for cough and chest tightness.   Cardiovascular: Positive for leg swelling. Negative for chest pain and palpitations.   Gastrointestinal: Negative for abdominal pain, constipation, diarrhea, nausea and vomiting.  Endocrine:       Overall, blood sugar control is improved.   Genitourinary: Negative for dysuria and frequency.  Musculoskeletal: Positive for arthralgias, joint swelling and myalgias. Negative for back pain and neck pain.  Skin: Positive for rash.  Allergic/Immunologic: Negative for environmental allergies, food allergies and immunocompromised state.  Neurological: Positive for weakness. Negative for tremors and numbness.  Hematological: Negative for adenopathy. Does not bruise/bleed easily.  Psychiatric/Behavioral: Negative for sleep disturbance and suicidal ideas. Behavioral problem: Depression.    Today's Vitals   06/29/17 1302  BP: (!) 176/94  Pulse: 76  Resp: 16  SpO2: 96%  Weight: (!) 309 lb 12.8 oz (140.5 kg)  Height: 5\' 6"  (  1.676 m)    Physical Exam  Constitutional: He is oriented to person, place, and time. He appears well-developed and well-nourished. No distress.  HENT:  Head: Normocephalic and atraumatic.  Mouth/Throat: Oropharynx is clear and moist. No oropharyngeal exudate.  Eyes: EOM are normal. Pupils are equal, round, and reactive to light.  Neck: Normal range of motion. Neck supple. No JVD present. No tracheal deviation present. No thyromegaly present.  Cardiovascular: Normal rate and regular rhythm. Exam reveals decreased pulses. Exam reveals no gallop and no friction rub.  Murmur heard.  Systolic murmur is present with a grade of 2/6. Pulses:      Dorsalis pedis pulses are 1+ on the right side, and 1+ on the left side.  Bilateral lower extremity swelling with redness and warmth of both lower legs. Pulses are present but diminished, bilaterally.   Pulmonary/Chest: Effort normal and breath sounds normal. No respiratory distress. He has no wheezes. He has no rales. He exhibits no tenderness.  Abdominal: Soft. Bowel sounds are normal. There is no tenderness.   Musculoskeletal:  Moderate to severe lower leg pain, bilaterally. Worse when standing and putting pressure on both feet. Walking increases pain.   Lymphadenopathy:    He has no cervical adenopathy.  Neurological: He is alert and oriented to person, place, and time. No cranial nerve deficit.  Skin: Skin is warm and dry. Rash noted. He is not diaphoretic. There is erythema.  Redness, warmth, and tenderness of the skin in bilateral lower extremities.   Psychiatric: He has a normal mood and affect. His behavior is normal. Judgment and thought content normal.  Nursing note and vitals reviewed.  Assessment/Plan:  1. Type 2 diabetes mellitus with diabetic neuropathic arthropathy, unspecified whether long term insulin use (HCC) - POCT HgB A1C 6.5 today. Much improved. No changes made to diabetic medications today.  2. Chronic pain disorder Overall, good pain management with current regimen.  - Oxycodone HCl 10 MG TABS; Take 1 tablet (10 mg total) by mouth 3 (three) times daily as needed.  Dispense: 90 tablet; Refill: 0 - fentaNYL (DURAGESIC - DOSED MCG/HR) 50 MCG/HR; Place 1 patch (50 mcg total) onto the skin every 3 (three) days.  Dispense: 10 patch; Refill: 0  3. Primary insomnia - zolpidem (AMBIEN CR) 12.5 MG CR tablet; Take 1 tablet (12.5 mg total) by mouth at bedtime as needed for sleep.  Dispense: 30 tablet; Refill: 3  General Counseling: Isahi verbalizes understanding of the findings of todays visit and agrees with plan of treatment. I have discussed any further diagnostic evaluation that may be needed or ordered today. We also reviewed his medications today. he has been encouraged to call the office with any questions or concerns that should arise related to todays visit.   Reviewed risks and possible side effects associated with taking opiates and benzodiazepines. Combination of these could cause dizziness and drowsiness. Advised him not to drive or operate machinery when taking these  medications, as he could put his life and the lives of others at risk. He voiced understanding.   This patient was seen by [name], FNP- C in Collaboration with Dr [name] as a part of collaborative care agreement    Orders Placed This Encounter  Procedures  . POCT HgB A1C    Meds ordered this encounter  Medications  . Oxycodone HCl 10 MG TABS    Sig: Take 1 tablet (10 mg total) by mouth 3 (three) times daily as needed.    Dispense:  90 tablet    Refill:  0    Order Specific Question:   Supervising Provider    Answer:   [name] [[id number]]  . fentaNYL (DURAGESIC - DOSED MCG/HR) 50 MCG/HR    Sig: Place 1 patch (50 mcg total) onto the skin every 3 (three) days.    Dispense:  10 patch    Refill:  0    Order Specific Question:   Supervising Provider    Answer:   [name] [[id number]]  . zolpidem (AMBIEN CR) 12.5 MG CR tablet    Sig: Take 1 tablet (12.5 mg total) by mouth at bedtime as needed for sleep.    Dispense:  30 tablet    Refill:  3    Order Specific Question:   Supervising Provider    Answer:   [name] [[id number]]    Time spent: 47 Minutes     Dr [name] Internal medicine

## 2017-07-09 NOTE — ED Triage Notes (Signed)
Sent  to ED by  PCP for possible dehydraion.  Patient  states he has been nauseated to 3-4 days, decreased appetitive.

## 2017-07-09 NOTE — Progress Notes (Addendum)
[facility] [place], [place] [address]  Internal MEDICINE  Office Visit Note  Patient Name: Adam Merritt  [id number]  [id number]  Date of Service: 07/09/2017  Chief Complaint  Patient presents with  . Nausea  . Other    no appetite and last night glucose was 80     Other  This is a new problem. The current episode started in the past 7 days. The problem occurs constantly. The problem has been unchanged. Associated symptoms include anorexia, arthralgias, chills, fatigue, headaches, nausea, a visual change, vomiting and weakness. Pertinent negatives include no chest pain, congestion, coughing or rash. Nothing aggravates the symptoms. He has tried nothing for the symptoms. The treatment provided no relief.   Pt is here for a sick visit.     Current Medication:  Outpatient Encounter Medications as of 07/09/2017  Medication Sig  . albuterol (PROVENTIL HFA;VENTOLIN HFA) 108 (90 BASE) MCG/ACT inhaler Inhale 1 puff into the lungs every 6 (six) hours as needed for wheezing or shortness of breath.  Marland Kitchen aspirin EC 81 MG EC tablet Take 1 tablet (81 mg total) by mouth daily.  Marland Kitchen atorvastatin (LIPITOR) 40 MG tablet Take 20 mg by mouth at bedtime.   . carvedilol (COREG) 25 MG tablet Take 25 mg by mouth 2 (two) times daily with a meal.  . clobetasol ointment (TEMOVATE) 5.46 % Apply 1 application topically 2 (two) times daily. Pt applies to lower legs.  . clopidogrel (PLAVIX) 75 MG tablet Take 1 tablet (75 mg total) by mouth daily.  . fentaNYL (DURAGESIC - DOSED MCG/HR) 50 MCG/HR Place 1 patch (50 mcg total) onto the skin every 3 (three) days.  Marland Kitchen gabapentin (NEURONTIN) 100 MG capsule Take 200 mg by mouth 3 (three) times daily.   . hydrALAZINE (APRESOLINE) 25 MG tablet Take 25 mg by mouth 3 (three) times daily.  . hydrocortisone 2.5 % cream Apply topically 2 (two) times daily.  Marland Kitchen ibuprofen (ADVIL,MOTRIN) 600 MG tablet Take 600 mg by mouth 2 (two) times daily as needed.  Marland Kitchen  ipratropium-albuterol (DUONEB) 0.5-2.5 (3) MG/3ML SOLN Take 3 mLs by nebulization every 4 (four) hours as needed (for shortness of breath).  . Liraglutide (VICTOZA) 18 MG/3ML SOPN Inject 1.8 mg into the skin daily.   . metolazone (ZAROXOLYN) 2.5 MG tablet Take 2.5 mg by mouth daily.  . montelukast (SINGULAIR) 10 MG tablet Take 10 mg by mouth daily as needed (for allergies).   . Multiple Vitamin (MULTIVITAMIN WITH MINERALS) TABS tablet Take 1 tablet by mouth daily.  Marland Kitchen omeprazole (PRILOSEC) 20 MG capsule Take 20 mg by mouth daily.  . Oxycodone HCl 10 MG TABS Take 1 tablet (10 mg total) by mouth 3 (three) times daily as needed.  . pentoxifylline (TRENTAL) 400 MG CR tablet Take 400 mg by mouth 3 (three) times daily.  . predniSONE (DELTASONE) 10 MG tablet Take 1 tablet (10 mg total) by mouth daily with breakfast.  . zolpidem (AMBIEN CR) 12.5 MG CR tablet Take 1 tablet (12.5 mg total) by mouth at bedtime as needed for sleep.   No facility-administered encounter medications on file as of 07/09/2017.       Medical History: Past Medical History:  Diagnosis Date  . Anemia   . CAD (coronary artery disease)   . Chest pain   . Coronary artery disease   . Diabetes mellitus without complication ([place])   . Esophageal reflux   . Herpes zoster without mention of complication   .  Hyperlipidemia   . Hypertension   . Insomnia   . Lumbago   . Neuropathy in diabetes ([place])   . Obstructive chronic bronchitis without exacerbation ([place])   . Other malaise and fatigue   . Stroke ([place])   . Varicose veins      Today's Vitals   07/09/17 1042  BP: (!) 150/99  Pulse: 64  Resp: 16  Temp: (!) 97.1 F (36.2 C)  SpO2: 96%  Weight: 295 lb 3.2 oz (133.9 kg)  Height: 5\' 7"  (1.702 m)    Review of Systems  Constitutional: Positive for activity change, appetite change, chills and fatigue.       Has not eaten anything in 4 days and has no desire to eat anything. Smell of food makes him feel like he is going to  vomit.  HENT: Negative for congestion, postnasal drip, rhinorrhea, sinus pain and voice change.   Eyes: Positive for pain and itching.  Respiratory: Negative for cough, shortness of breath and wheezing.   Cardiovascular: Positive for leg swelling. Negative for chest pain and palpitations.  Gastrointestinal: Positive for anorexia, nausea and vomiting.  Endocrine:       "low" blood sugars. Have been running around 80. Has not had daily insulin today.   Musculoskeletal: Positive for arthralgias.  Skin: Negative for color change, pallor, rash and wound.  Allergic/Immunologic: Negative for environmental allergies, food allergies and immunocompromised state.  Neurological: Positive for weakness and headaches.  Hematological: Negative for adenopathy. Does not bruise/bleed easily.  Psychiatric/Behavioral: Negative for behavioral problems.    Physical Exam  Constitutional: He is oriented to person, place, and time. He appears well-developed and well-nourished. He is cooperative. He has a sickly appearance.  HENT:  Head: Normocephalic and atraumatic.  Eyes: Pupils are equal, round, and reactive to light.  Neck: Normal range of motion. Neck supple.  Cardiovascular: Normal rate and regular rhythm.  Murmur heard. Pulmonary/Chest: Effort normal and breath sounds normal. No respiratory distress. He has no wheezes.  Abdominal: Soft. Bowel sounds are normal. There is tenderness.  Lymphadenopathy:    He has no cervical adenopathy.  Neurological: He is alert and oriented to person, place, and time.  Skin: Skin is warm and dry.  Psychiatric: He has a normal mood and affect. His behavior is normal. Judgment and thought content normal.  Nursing note and vitals reviewed.   Assessment/Plan: 1. Nausea and vomiting, intractability of vomiting not specified, unspecified vomiting type - ondansetron (ZOFRAN) 8 MG tablet; Take 1 tablet (8 mg total) by mouth every 8 (eight) hours as needed for nausea or  vomiting.  Dispense: 30 tablet; Refill: 1  2. Uncontrolled type 2 diabetes mellitus with hyperglycemia (HCC) - POCT CBG (Fasting - Glucose) Blood sugar 104 today. Advised him and his wife, that insulin and other DM medication should be taken as prescribed, even during times of illness.   3. Benign essential hypertension Oveall, bp stable. Continue all meds as prescribed.   4. Chronic obstructive pulmonary disease, unspecified COPD type ([place]) Use inhalers and respiratory medications as prescribed.   Based on new symptoms and lack of appetite for 4 days, along with nausea and vomiting, advised the patient to be seen in ER to get IVfluids and possibly antibiotics as indicated.   General Counseling: Lemar verbalizes understanding of the findings of todays visit and agrees with plan of treatment. I have discussed any further diagnostic evaluation that may be needed or ordered today. We also reviewed his medications today. he has been encouraged  to call the office with any questions or concerns that should arise related to todays visit.   Orders Placed This Encounter  Procedures  . POCT CBG (Fasting - Glucose)      Time spent: 15 Minutes

## 2017-07-09 NOTE — ED Notes (Signed)
Pt to finish half of fluids before leaving AMA.

## 2017-07-09 NOTE — ED Notes (Signed)
CT came to take pt for scan. Pt refused. MD informed.

## 2017-07-12 ENCOUNTER — Telehealth: Payer: Self-pay

## 2017-07-12 NOTE — Telephone Encounter (Signed)
GAVE VERBAL ORDERS FOR [facility] FOR CONTINUE CARE FOR PHYSICAL THERAPY/ BR

## 2017-07-20 ENCOUNTER — Encounter: Payer: Medicare Other | Admitting: Physician Assistant

## 2017-07-20 DIAGNOSIS — L89623 Pressure ulcer of left heel, stage 3: Secondary | ICD-10-CM | POA: Diagnosis not present

## 2017-07-23 NOTE — Progress Notes (Signed)
Adam, Merritt ([id number]) Visit Report for 07/20/2017 Arrival Information Details Patient Name: Adam Merritt, Adam Merritt Date of Service: 07/20/2017 12:30 PM Medical Record Number: [id number] Patient Account Number: [id number] Date of Birth/Sex: 12/02/1945 (71 y.o. Male) Treating RN: Adam Merritt Primary Care Adam Merritt: Adam Merritt Other Clinician: Referring Adam Merritt: Adam Merritt Treating Adam Merritt/Extender: Adam Merritt, Adam Merritt in Treatment: 16 Visit Information History Since Last Visit Added or deleted any medications: No Patient Arrived: Cane Any new allergies or adverse reactions: No Arrival Time: 12:35 Had a fall or experienced change in No Accompanied By: spouse activities of daily living that may affect Transfer Assistance: None risk of falls: Patient Identification Verified: Yes Signs or symptoms of abuse/neglect since last visito No Secondary Verification Process Completed: Yes Hospitalized since last visit: No Patient Requires Transmission-Based Precautions: No Has Dressing in Place as Prescribed: Yes Patient Has Alerts: Yes Pain Present Now: No Patient Alerts: DMII Electronic Signature(s) Signed: 07/20/2017 4:58:58 PM By: Adam Merritt Entered By: Adam Merritt on 07/20/2017 12:39:59 Adam Merritt ([id number]) -------------------------------------------------------------------------------- Clinic Level of Care Assessment Details Patient Name: Adam Merritt Date of Service: 07/20/2017 12:30 PM Medical Record Number: [id number] Patient Account Number: [id number] Date of Birth/Sex: Oct 03, 1945 (72 y.o. Male) Treating RN: Adam Merritt Primary Care Adam Merritt: Adam Merritt Other Clinician: Referring Adam Merritt: Adam Merritt Treating Adam Merritt/Extender: Adam Merritt, Adam Merritt in Treatment: 16 Clinic Level of Care Assessment Items TOOL 4 Quantity Score []  - Use when only an EandM is performed on FOLLOW-UP visit 0 ASSESSMENTS - Nursing Assessment / Reassessment X - Reassessment  of Co-morbidities (includes updates in patient status) 1 10 X- 1 5 Reassessment of Adherence to Treatment Plan ASSESSMENTS - Wound and Skin Assessment / Reassessment X - Simple Wound Assessment / Reassessment - one wound 1 5 []  - 0 Complex Wound Assessment / Reassessment - multiple wounds []  - 0 Dermatologic / Skin Assessment (not related to wound area) ASSESSMENTS - Focused Assessment []  - Circumferential Edema Measurements - multi extremities 0 []  - 0 Nutritional Assessment / Counseling / Intervention X- 1 5 Lower Extremity Assessment (monofilament, tuning fork, pulses) []  - 0 Peripheral Arterial Disease Assessment (using hand held doppler) ASSESSMENTS - Ostomy and/or Continence Assessment and Care []  - Incontinence Assessment and Management 0 []  - 0 Ostomy Care Assessment and Management (repouching, etc.) PROCESS - Coordination of Care X - Simple Patient / Family Education for ongoing care 1 15 []  - 0 Complex (extensive) Patient / Family Education for ongoing care []  - 0 Staff obtains Programmer, systems, Records, Test Results / Process Orders []  - 0 Staff telephones HHA, Nursing Homes / Clarify orders / etc []  - 0 Routine Transfer to another Facility (non-emergent condition) []  - 0 Routine Hospital Admission (non-emergent condition) []  - 0 New Admissions / Biomedical engineer / Ordering NPWT, Apligraf, etc. []  - 0 Emergency Hospital Admission (emergent condition) X- 1 10 Simple Discharge Coordination Adam Merritt, Adam Merritt ([id number]) []  - 0 Complex (extensive) Discharge Coordination PROCESS - Special Needs []  - Pediatric / Minor Patient Management 0 []  - 0 Isolation Patient Management []  - 0 Hearing / Language / Visual special needs []  - 0 Assessment of Community assistance (transportation, D/C planning, etc.) []  - 0 Additional assistance / Altered mentation []  - 0 Support Surface(s) Assessment (bed, cushion, seat, etc.) INTERVENTIONS - Wound Cleansing / Measurement X -  Simple Wound Cleansing - one wound 1 5 []  - 0 Complex Wound Cleansing - multiple wounds X- 1 5 Wound Imaging (photographs - any number of wounds) []  -  0 Wound Tracing (instead of photographs) X- 1 5 Simple Wound Measurement - one wound []  - 0 Complex Wound Measurement - multiple wounds INTERVENTIONS - Wound Dressings []  - Small Wound Dressing one or multiple wounds 0 []  - 0 Medium Wound Dressing one or multiple wounds []  - 0 Large Wound Dressing one or multiple wounds []  - 0 Application of Medications - topical []  - 0 Application of Medications - injection INTERVENTIONS - Miscellaneous []  - External ear exam 0 []  - 0 Specimen Collection (cultures, biopsies, blood, body fluids, etc.) []  - 0 Specimen(s) / Culture(s) sent or taken to Lab for analysis []  - 0 Patient Transfer (multiple staff / Civil Service fast streamer / Similar devices) []  - 0 Simple Staple / Suture removal (25 or less) []  - 0 Complex Staple / Suture removal (26 or more) []  - 0 Hypo / Hyperglycemic Management (close monitor of Blood Glucose) []  - 0 Ankle / Brachial Index (ABI) - do not check if billed separately X- 1 5 Vital Signs Adam Merritt, Adam Merritt ([id number]) Has the patient been seen at the hospital within the last three years: Yes Total Score: 70 Level Of Care: New/Established - Level 2 Electronic Signature(s) Signed: 07/20/2017 4:58:58 PM By: Adam Merritt Entered By: Adam Merritt on 07/20/2017 14:52:12 Adam Merritt ([id number]) -------------------------------------------------------------------------------- Encounter Discharge Information Details Patient Name: Adam Merritt Date of Service: 07/20/2017 12:30 PM Medical Record Number: [id number] Patient Account Number: [id number] Date of Birth/Sex: 1945-08-13 (72 y.o. Male) Treating RN: Adam Merritt Primary Care Adam Merritt: Adam Merritt Other Clinician: Referring Adam Merritt: Adam Merritt Treating Adam Merritt/Extender: Adam Merritt, Adam Merritt in Treatment:  16 Encounter Discharge Information Items Discharge Pain Level: 0 Discharge Condition: Stable Ambulatory Status: Cane Discharge Destination: Home Transportation: Private Auto Accompanied By: spouse Schedule Follow-up Appointment: No Medication Reconciliation completed and No provided to Patient/Care Yomar Mejorado: Provided on Clinical Summary of Care: 07/20/2017 Form Type Recipient Paper Patient JT Electronic Signature(s) Signed: 07/20/2017 2:52:40 PM By: Adam Merritt Entered By: Adam Merritt on 07/20/2017 14:52:39 Adam Merritt ([id number]) -------------------------------------------------------------------------------- Lower Extremity Assessment Details Patient Name: Adam Merritt Date of Service: 07/20/2017 12:30 PM Medical Record Number: [id number] Patient Account Number: [id number] Date of Birth/Sex: 02/08/1946 (72 y.o. Male) Treating RN: Adam Merritt Primary Care Cassadie Pankonin: Adam Merritt Other Clinician: Referring Axell Trigueros: Adam Merritt Treating Chigozie Basaldua/Extender: Adam Merritt, Adam Merritt in Treatment: 16 Vascular Assessment Pulses: Dorsalis Pedis Palpable: [Left:Yes] Posterior Tibial Extremity colors, hair growth, and conditions: Extremity Color: [Left:Hyperpigmented] Hair Growth on Extremity: [Left:No] Temperature of Extremity: [Left:Warm] Capillary Refill: [Left:< 3 seconds] Electronic Signature(s) Signed: 07/20/2017 4:58:58 PM By: Adam Merritt Entered By: Adam Merritt on 07/20/2017 12:47:21 Adam Merritt ([id number]) -------------------------------------------------------------------------------- [place] Details Patient Name: Adam Merritt Date of Service: 07/20/2017 12:30 PM Medical Record Number: [id number] Patient Account Number: [id number] Date of Birth/Sex: 02-Feb-1946 (72 y.o. Male) Treating RN: Adam Merritt Primary Care Yahira Timberman: Adam Merritt Other Clinician: Referring Calieb Lichtman: Adam Merritt Treating Lasonya Hubner/Extender: Adam Merritt,  Adam Merritt in Treatment: 16 Active Inactive Electronic Signature(s) Signed: 07/20/2017 4:58:58 PM By: Adam Merritt Entered By: Adam Merritt on 07/20/2017 13:09:09 Adam Merritt ([id number]) -------------------------------------------------------------------------------- Pain Assessment Details Patient Name: Adam Merritt Date of Service: 07/20/2017 12:30 PM Medical Record Number: [id number] Patient Account Number: [id number] Date of Birth/Sex: 09/19/1945 (72 y.o. Male) Treating RN: Adam Merritt Primary Care Quandre Polinski: Adam Merritt Other Clinician: Referring Jaleah Lefevre: Adam Merritt Treating Daekwon Beswick/Extender: STONE III, Adam Merritt in Treatment: 16 Active Problems Location of Pain Severity and Description of Pain Patient Has Paino No  Site Locations Pain Management and Medication Current Pain Management: Notes Topical or injectable lidocaine is offered to patient for acute pain when surgical debridement is performed. If needed, Patient is instructed to use over the counter pain medication for the following 24-48 hours after debridement. Wound care MDs do not prescribed pain medications. Patient has chronic pain or uncontrolled pain. Patient has been instructed to make an appointment with their Primary Care Physician for pain management. Electronic Signature(s) Signed: 07/20/2017 4:58:58 PM By: Adam Merritt Entered By: Adam Merritt on 07/20/2017 12:40:09 Adam Merritt ([id number]) -------------------------------------------------------------------------------- Patient/Caregiver Education Details Patient Name: Adam Merritt Date of Service: 07/20/2017 12:30 PM Medical Record Number: [id number] Patient Account Number: [id number] Date of Birth/Gender: 27-Aug-1945 (71 y.o. Male) Treating RN: Adam Merritt Primary Care Physician: Adam Merritt Other Clinician: Referring Physician: Clayborn Merritt Treating Physician/Extender: Sharalyn Ink in Treatment: 16 Education  Assessment Education Provided To: Patient and Caregiver Education Topics Provided Basic Hygiene: Handouts: Other: care of newly healed ulcer site Methods: Explain/Verbal Responses: State content correctly Electronic Signature(s) Signed: 07/20/2017 4:58:58 PM By: Adam Merritt Entered By: Adam Merritt on 07/20/2017 14:53:05 Adam Merritt ([id number]) -------------------------------------------------------------------------------- Wound Assessment Details Patient Name: Adam Merritt Date of Service: 07/20/2017 12:30 PM Medical Record Number: [id number] Patient Account Number: [id number] Date of Birth/Sex: [birth year] (72 y.o. Male) Treating RN: Adam Merritt Primary Care [employer]: Adam Merritt Other Clinician: Referring Madell Heino: Adam Merritt Treating Tristan Proto/Extender: Adam Merritt, Adam Merritt in Treatment: 16 Wound Status Wound Number: 8 Primary Etiology: Pressure Ulcer Wound Location: Left Calcaneus Secondary Diabetic Wound/Ulcer of the Lower Etiology: Extremity Wounding Event: Pressure Injury Wound Status: Healed - Epithelialized Date Acquired: 04/16/2017 Merritt Of Treatment: 13 Clustered Wound: No Photos Photo Uploaded By: Adam Merritt on 07/20/2017 16:28:00 Wound Measurements Length: (cm) 0 % Width: (cm) 0 % Depth: (cm) 0 Area: (cm) 0 Volume: (cm) 0 Reduction in Area: 100% Reduction in Volume: 100% Wound Description Classification: Category/Stage III Periwound Skin Texture Texture Color No Abnormalities Noted: No No Abnormalities Noted: No Moisture No Abnormalities Noted: No Electronic Signature(s) Signed: 07/20/2017 4:58:58 PM By: Adam Merritt Entered By: Adam Merritt on 07/20/2017 13:08:46 Adam Merritt ([id number]) -------------------------------------------------------------------------------- Vitals Details Patient Name: Adam Merritt Date of Service: 07/20/2017 12:30 PM Medical Record Number: [id number] Patient Account Number:  [id number] Date of Birth/Sex: Feb 14, 1946 (72 y.o. Male) Treating RN: Adam Merritt Primary Care Murphy Duzan: Adam Merritt Other Clinician: Referring Maydelin Deming: Adam Merritt Treating Arna [name]/Extender: STONE III, Adam Merritt in Treatment: 16 Vital Signs Time Taken: 12:40 Temperature (F): 97.9 Height (in): 64 Pulse (bpm): 70 Weight (lbs): 298 Respiratory Rate (breaths/min): 20 Body Mass Index (BMI): 51.1 Blood Pressure (mmHg): 111/66 Reference Range: 80 - 120 mg / dl Electronic Signature(s) Signed: 07/20/2017 4:58:58 PM By: Adam Merritt Entered By: Adam Merritt on 07/20/2017 12:40:54

## 2017-07-23 NOTE — Progress Notes (Signed)
ICKER, SWIGERT ([id number]) Visit Report for 07/20/2017 Chief Complaint Document Details Patient Name: Adam Merritt, Adam Merritt Date of Service: 07/20/2017 12:30 PM Medical Record Number: [id number] Patient Account Number: [id number] Date of Birth/Sex: 1946-01-28 (71 y.o. Male) Treating RN: Montey Hora Primary Care Provider: [name] Other Clinician: Referring Provider: [name] Treating Provider/Extender: Melburn Hake, Crystol Walpole [name]: 16 Information Obtained from: Patient Chief Complaint the patient was recently discharged and now is back for a pressure ulcer on his left heel Electronic Signature(s) Signed: 07/23/2017 8:04:39 AM By: Worthy Keeler PA-C Entered By: Worthy Keeler on 07/20/2017 12:53:41 Adam Merritt ([id number]) -------------------------------------------------------------------------------- HPI Details Patient Name: Adam Merritt Date of Service: 07/20/2017 12:30 PM Medical Record Number: [id number] Patient Account Number: [id number] Date of Birth/Sex: August 22, 1945 (72 y.o. Male) Treating RN: Montey Hora Primary Care Provider: [name] Other Clinician: Referring Provider: [name] Treating Provider/Extender: Melburn Hake, Ariannie Penaloza [name]: 16 History of Present Illness HPI Description: 72 year old gentleman with chronic bilateral lower extremity lymphedema associated with diabetes mellitus and obesity has been very compliant with his management and has been going to the lymphedema clinic in the recent past. He now returns with a small superficial ulcer on the left anterior lower calf about a week's duration He has been under the treatment of the lymphedema clinic for the last month. 04/20/2017 -- the patient was discharged a few weeks ago for lymphedema with ulceration and is under the care of the lymphedema clinic. However he had a fall about 2 weeks ago where he lost consciousness and was laying on the floor for about an hour. During this time he  has sustained a pressure injury to the left heel. Of note he has had a vascular consult by Dr. Annamarie Major -- on 09/29/2013 and Dr. Trula Slade opinion was:"I have reviewed his outside ultrasound which shows an ankle-brachial index of 0.72 on the left and 1.0 on the right.o This study was repeated at our office.o The ankle-brachial index on the right was 1.26 with triphasic waveforms.o On the left it was a 1.04 with biphasic waveforms.o The patient had a toe pressure of 121 on the right and 100 on the left. In summary, I would not recommend any further imaging , as I do not think his symptoms are related to arterial insufficiency, " 06/04/17 patient's heel ulcer appears to be doing excellent today. He has been tolerating the dressing changes without complication. No fevers, chills, nausea, or vomiting noted at this time. I am very pleased with the progress he has made since my last evaluation with him. Patient is having no pain. 06/18/17 on evaluation today patient appears to be doing very well in regard to his left heel wound. In fact this appears to be almost healed. There was eschar covering which did require removal today and underlying there did appear to still be a small opening although this is very minimal compared to where he has been. Overall I'm extremely pleased with the progress that he has made. Patient likewise is also pleased. 07/20/17 on evaluation today patient appears to be doing very well in regard to his left heel. There is a small blister but no open wound at this point blister separate from the wound which has completely healed currently. Patient has been tolerating the dressing changes without complication. There does not appear to be any evidence of infection and I think the small blister likely came from rubbing on the current shoes the good news is he just  picked up today his diabetic shoes which I think are gonna be much better for him. ===== Old notes: 72 year old  gentleman seen earlier at our clinic in June 2016 and again in June 2017 is noncompliant with recommendations and does not follow-up regularly. 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 He had referred him to the lymphedema clinic and he was accepted but he did not follow-up with them. At that stage he had agreed to get a repeat arterial study done and he has seen Dr. Trula Slade in [place]. This time around he has been seen by his PCP Dr. Tiburcio Bash who referred him urgently to [place] for bilateral lower extremity cellulitis in spite of being on antibiotics -- he was placed on Keflex and 500 mg 3 times a day for 10 days. His comorbidities include coronary artery disease, diabetes mellitus, hypertension, Bell's palsy and chronic lymphedema. he was also recently admitted to the hospital on April 13 and kept overnight with the diagnosis of a TIA and a left ICA stenosis to follow-up with vascular surgery as an outpatient 10/05/2016 -- patient has been compliant this week and left his compression and dressing on for the entire week and had home health change it appropriately. Adam Merritt, Adam Merritt ([id number]) 11/13/2016 -- his insurance will not cover compression stockings for lymphedema and hence we will have to send him to the lymphedema clinic as soon as we have finished with his healing. 11/20/2016 -- his lymphedema clinic appointment is still pending but other than that he has done very well and we are ready to transition him over. 11/27/16 patient's wounds appeared to be completely healed on evaluation today. He still has bilateral left edema and has been referred to the lymphedema clinic although we are still waiting to hear back from them on scheduling. Apparently she has to be discharged from home  health services prior to being able to establish with the lymphedema clinic. At this point considering he is completely healed from the standpoint of wounds of the bilateral lower extremities I think it is appropriate to discharge him from home health and initiate weekly wraps on our end. There is no evidence of infection. 01/01/2017 -- the patient was recently discharged on 12/04/2016 and asked to follow-up in the lymphedema clinic. However they seem to be some issues with his going to the lymphedema clinic and then referring him to dermatology for an opinion regarding his skin. Ultimately the patient did not have a follow-up and has now redeveloped wheezing from the right lower extremity and ulceration on the left lower extremity. The patient and his wife are rather helpless and do not seem to be very compliant with their care. 01/08/17 on evaluation today patient has just a single lower extremity wounds noted. Fortunately he is having no significant pain, no bowe or bladder dysfunction, no nausea and no vomiting. There's nothing to indicate a systemic worsening infection. 01/16/17 on a violation today patient appears to be doing fairly well in regard to his bilateral lower extremities. He has a very small ulcer which is barely openable left lower extremity and otherwise is completely closed in regard to both legs. I believe we may be ready to get him back to the lymphedema clinic at this point. ==== Old Notes: 72 year old gentleman  who comes with bilateral lower limb edema for over 20 years and recent attack of blisters and redness for about 2 weeks. The patient says he has had bilateral lower limb edema since 1996. He has been noncompliant with wearing wraps in the past and even was given a boot to wear at night which she never tolerated and didn't wear it. He has a past medical history of sleep apnea, neuropathy with diabetes, hypertension, hyperlipidemia, coronary artery disease, varicose  veins, carotid artery disease and is status post CABG and cholecystectomy. He was seen by the vascular surgeon on 09/29/2013 and Dr. Trula Slade opinion was:"I have reviewed his outside ultrasound which shows an ankle-brachial index of 0.72 on the left and 1.0 on the right.o This study was repeated at our office.o The ankle-brachial index on the right was 1.26 with triphasic waveforms.o On the left it was a 1.04 with biphasic waveforms.o The patient had a toe pressure of 121 on the right and 100 on the left. In summary, I would not recommend any further imaging , as I do not think his symptoms are related to arterial insufficiency, but rather secondary to diabetic neuropathy.o The patient still has room for increasing his Neurontin dose, which he states does help him.o I have scheduled him to come back in one year for a repeat arterial evaluation." in June 2016, a year ago, he has a venous duplex study to be done. He was also going to be accepted to the lymphedema clinic but I did not believe the patient went regularly for this. ==== Electronic Signature(s) Signed: 07/23/2017 8:04:39 AM By: Worthy Keeler PA-C Entered By: Worthy Keeler on 07/20/2017 13:26:24 Adam Merritt ([id number]) -------------------------------------------------------------------------------- Physical Exam Details Patient Name: Adam Merritt Date of Service: 07/20/2017 12:30 PM Medical Record Number: [id number] Patient Account Number: [id number] Date of Birth/Sex: 1946-05-27 (72 y.o. Male) Treating RN: Montey Hora Primary Care Provider: [name] Other Clinician: Referring Provider: [name] Treating Provider/Extender: Melburn Hake, Afsheen Antony [name]: 16 Constitutional Obese and well-hydrated in no acute distress. Respiratory normal breathing without difficulty. clear to auscultation bilaterally. Cardiovascular regular rate and rhythm with normal S1, S2. Psychiatric this patient is able to make  decisions and demonstrates good insight into disease process. Alert and Oriented x 3. pleasant and cooperative. Notes Patient's wound is completely healed on evaluation today. The small blister which is adjacent to the wound I think is just due to rubbing of the shoe and I believe will be okay. However this is something I did tell patient's wife to keep an eye on for [place]. Electronic Signature(s) Signed: 07/23/2017 8:04:39 AM By: Worthy Keeler PA-C Entered By: Worthy Keeler on 07/20/2017 13:28:20 Adam Merritt ([id number]) -------------------------------------------------------------------------------- Physician Orders Details Patient Name: Adam Merritt Date of Service: 07/20/2017 12:30 PM Medical Record Number: [id number] Patient Account Number: [id number] Date of Birth/Sex: 07-25-1945 (71 y.o. 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 I89.0 Lymphedema, not elsewhere classified E66.01 Morbid (severe) obesity due to excess calories L97.222 Non-pressure chronic ulcer of left calf with fat layer exposed L89.623 Pressure ulcer of left heel, stage 3 Discharge From [facility] o Discharge from [place] Signature(s) Signed: 07/20/2017 4:58:58 PM By: Montey Hora Signed: 07/23/2017 8:04:39 AM By: Worthy Keeler PA-C  Entered By: Montey Hora on 07/20/2017 13:09:22 Adam Merritt ([id number]) -------------------------------------------------------------------------------- Problem List Details Patient Name: Adam Merritt Date of Service: 07/20/2017 12:30 PM Medical Record Number: [id number] Patient Account Number: [id number] Date of Birth/Sex: 1946/04/26 (71 y.o. 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 03/26/2017 Yes I89.0 Lymphedema, not elsewhere classified 03/26/2017 Yes E66.01 Morbid (severe) obesity due to excess calories 03/26/2017 Yes L97.222 Non-pressure chronic ulcer of left calf with fat layer exposed 03/26/2017 Yes L89.623 Pressure ulcer of left heel, stage 3 04/20/2017 Yes Inactive Problems Resolved Problems Electronic Signature(s) Signed: 07/23/2017 8:04:39 AM By: Worthy Keeler PA-C Entered By: Worthy Keeler on 07/20/2017 12:53:34 Adam Merritt ([id number]) -------------------------------------------------------------------------------- Progress Note Details Patient Name: Adam Merritt Date of Service: 07/20/2017 12:30 PM Medical Record Number: [id number] Patient Account Number: [id number] Date of Birth/Sex: 17-Mar-1946 (72 y.o. Male) Treating RN: Montey Hora Primary Care Provider: [name] Other Clinician: Referring Provider: [name] Treating Provider/Extender: Melburn Hake, Draeden Kellman [name]: 16 Subjective Chief Complaint Information obtained from Patient the patient was recently discharged and now is back for a pressure ulcer on his left heel History of Present Illness (HPI) 72 year old gentleman with chronic bilateral lower extremity lymphedema associated with diabetes mellitus and obesity has been very compliant with his management and has been going to the lymphedema clinic in the recent past. He now returns with a small superficial ulcer on the left anterior lower calf about a week's duration He has been under the treatment of the lymphedema clinic for the last month. 04/20/2017 -- the patient was discharged a few weeks ago for lymphedema with  ulceration and is under the care of the lymphedema clinic. However he had a fall about 2 weeks ago where he lost consciousness and was laying on the floor for about an hour. During this time he has sustained a pressure injury to the left heel. Of note he has had a vascular consult by Dr. Annamarie Major -- on 09/29/2013 and Dr. Trula Slade opinion was:"I have reviewed his outside ultrasound which shows an ankle-brachial index of 0.72 on the left and 1.0 on the right. This study was repeated at our office. The ankle-brachial index on the right was 1.26 with triphasic waveforms. On the left it was a 1.04 with biphasic waveforms. The patient had a toe pressure of 121 on the right and 100 on the left. In summary, I would not recommend any further imaging , as I do not think his symptoms are related to arterial insufficiency, " 06/04/17 patient's heel ulcer appears to be doing excellent today. He has been tolerating the dressing changes without complication. No fevers, chills, nausea, or vomiting noted at this time. I am very pleased with the progress he has made since my last evaluation with him. Patient is having no pain. 06/18/17 on evaluation today patient appears to be doing very well in regard to his left heel wound. In fact this appears to be almost healed. There was eschar covering which did require removal today and underlying there did appear to still be a small opening although this is very minimal compared to where he has been. Overall I'm extremely pleased with the progress that he has made. Patient likewise is also pleased. 07/20/17 on evaluation today patient appears to be doing very  well in regard to his left heel. There is a small blister but no open wound at this point blister separate from the wound which has completely healed currently. Patient has been tolerating the dressing changes without complication. There does not appear to be any evidence of infection and I think the small  blister likely came from rubbing on the current shoes the good news is he just picked up today his diabetic shoes which I think are gonna be much better for him. ===== Old notes: 72 year old gentleman seen earlier at our clinic in June 2016 and again in June 2017 is noncompliant with recommendations and does not follow-up regularly. 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 He had referred him to the lymphedema clinic and he was accepted but he did not follow-up with them. At that stage he had agreed to get a repeat arterial study done and he has seen Dr. Trula Slade in [place]. This time around he has been seen by his PCP Dr. Tiburcio Bash who referred him urgently to [place] for bilateral lower extremity cellulitis in spite of being on antibiotics -- he was placed on Keflex and 500 mg 3 times a day for 10 days. His comorbidities include coronary artery disease, diabetes mellitus, hypertension, Bell's palsy and chronic lymphedema. Adam Merritt, Adam Merritt ([id number]) he was also recently admitted to the hospital on April 13 and kept overnight with the diagnosis of a TIA and a left ICA stenosis to follow-up with vascular surgery as an outpatient 10/05/2016 -- patient has been compliant this week and left his compression and dressing on for the entire week and had home health change it appropriately. 11/13/2016 -- his insurance will not cover compression stockings for lymphedema and hence we will have to send him to the lymphedema clinic as soon as we have finished with his healing. 11/20/2016 -- his lymphedema clinic appointment is still pending but other than that he has done very well and we are ready to transition him over. 11/27/16 patient's wounds appeared to be completely healed on evaluation today. He  still has bilateral left edema and has been referred to the lymphedema clinic although we are still waiting to hear back from them on scheduling. Apparently she has to be discharged from home health services prior to being able to establish with the lymphedema clinic. At this point considering he is completely healed from the standpoint of wounds of the bilateral lower extremities I think it is appropriate to discharge him from home health and initiate weekly wraps on our end. There is no evidence of infection. 01/01/2017 -- the patient was recently discharged on 12/04/2016 and asked to follow-up in the lymphedema clinic. However they seem to be some issues with his going to the lymphedema clinic and then referring him to dermatology for an opinion regarding his skin. Ultimately the patient did not have a follow-up and has now redeveloped wheezing from the right lower extremity and ulceration on the left lower extremity. The patient and his wife are rather helpless and do not seem to be very compliant with their care. 01/08/17 on evaluation today patient has just a single lower extremity wounds noted. Fortunately he is having no significant pain, no bowe or bladder dysfunction, no nausea and no vomiting. There's nothing to indicate  a systemic worsening infection. 01/16/17 on a violation today patient appears to be doing fairly well in regard to his bilateral lower extremities. He has a very small ulcer which is barely openable left lower extremity and otherwise is completely closed in regard to both legs. I believe we may be ready to get him back to the lymphedema clinic at this point. ==== Old Notes: 72 year old gentleman who comes with bilateral lower limb edema for over 20 years and recent attack of blisters and redness for about 2 weeks. The patient says he has had bilateral lower limb edema since 1996. He has been noncompliant with wearing wraps in the past and even was given a boot to wear at  night which she never tolerated and didn't wear it. He has a past medical history of sleep apnea, neuropathy with diabetes, hypertension, hyperlipidemia, coronary artery disease, varicose veins, carotid artery disease and is status post CABG and cholecystectomy. He was seen by the vascular surgeon on 09/29/2013 and Dr. Trula Slade opinion was:"I have reviewed his outside ultrasound which shows an ankle-brachial index of 0.72 on the left and 1.0 on the right. This study was repeated at our office. The ankle-brachial index on the right was 1.26 with triphasic waveforms. On the left it was a 1.04 with biphasic waveforms. The patient had a toe pressure of 121 on the right and 100 on the left. In summary, I would not recommend any further imaging , as I do not think his symptoms are related to arterial insufficiency, but rather secondary to diabetic neuropathy. The patient still has room for increasing his Neurontin dose, which he states does help him. I have scheduled him to come back in one year for a repeat arterial evaluation." in June 2016, a year ago, he has a venous duplex study to be done. He was also going to be accepted to the lymphedema clinic but I did not believe the patient went regularly for this. ==== Patient History Information obtained from Patient. Family History Cancer - Paternal Grandparents, Kidney Disease - Siblings, Lung Disease - Mother, No family history of Diabetes, Heart Disease, Hereditary Spherocytosis, Hypertension, Seizures, Stroke, Thyroid Problems, Tuberculosis. Adam Merritt, Adam Merritt ([id number]) Social History Never smoker, Marital Status - Married, Alcohol Use - Never, Drug Use - No History, Caffeine Use - Never. Medical History Hospitalization/Surgery History - 11/03/2013, [facility], bronchitis. Medical And Surgical History Notes Cardiovascular varicose veins Neurologic CVA - 1992 Review of Systems (ROS) Constitutional Symptoms (General Health) Denies complaints or  symptoms of Fever, Chills. Respiratory The patient has no complaints or symptoms. Cardiovascular Complains or has symptoms of LE edema. Psychiatric The patient has no complaints or symptoms. Objective Constitutional Obese and well-hydrated in no acute distress. Vitals Time Taken: 12:40 PM, Height: 64 in, Weight: 298 lbs, BMI: 51.1, Temperature: 97.9 F, Pulse: 70 bpm, Respiratory Rate: 20 breaths/min, Blood Pressure: 111/66 mmHg. Respiratory normal breathing without difficulty. clear to auscultation bilaterally. Cardiovascular regular rate and rhythm with normal S1, S2. Psychiatric this patient is able to make decisions and demonstrates good insight into disease process. Alert and Oriented x 3. pleasant and cooperative. General Notes: Patient's wound is completely healed on evaluation today. The small blister which is adjacent to the wound I think is just due to rubbing of the shoe and I believe will be okay. However this is something I did tell patient's wife to keep an eye on for [place]. Integumentary (Hair, Skin) Wound #8 status is Healed - Epithelialized. Original cause of wound was Pressure Injury.  The wound is located on the Left Calcaneus. The wound measures 0cm length x 0cm width x 0cm depth; 0cm^2 area and 0cm^3 volume. Adam Merritt, Adam Merritt ([id number]) Assessment Active Problems ICD-10 E11.620 - Type 2 diabetes mellitus with diabetic dermatitis I89.0 - Lymphedema, not elsewhere classified E66.01 - Morbid (severe) obesity due to excess calories L97.222 - Non-pressure chronic ulcer of left calf with fat layer exposed L89.623 - Pressure ulcer of left heel, stage 3 Plan Discharge From [facility]: Discharge from [place] At this point patient appears to be healed and I'm going to discharge him from current wound care services. He's in agreement with plan. We will see him for reevaluation in future as needed if anything worsens or changes. He is in agreement with the  plan. He will utilize Betadine as well as a Band-Aid over the blister area if this opens and caused any problems he will definitely let me know. Electronic Signature(s) Signed: 07/23/2017 8:04:39 AM By: Worthy Keeler PA-C Entered By: Worthy Keeler on 07/20/2017 13:31:25 Adam Merritt ([id number]) -------------------------------------------------------------------------------- ROS/PFSH Details Patient Name: Adam Merritt Date of Service: 07/20/2017 12:30 PM Medical Record Number: [id number] Patient Account Number: [id number] Date of Birth/Sex: March 04, 1946 (72 y.o. Male) Treating RN: Montey Hora Primary Care Provider: [name] Other Clinician: Referring Provider: [name] Treating Provider/Extender: Melburn Hake, Jenise Iannelli [name]: 16 Information Obtained From Patient Wound History Do you currently have one or more open woundso Yes How many open wounds do you currently haveo 1 Approximately how long have you had your woundso 10 days How have you been treating your wound(s) until nowo dry dressing Has your wound(s) ever healed and then re-openedo No Have you had any lab work done in the past montho No Have you tested positive for an antibiotic resistant organism (MRSA, VRE)o No Have you tested positive for osteomyelitis (bone infection)o No Have you had any tests for circulation on your legso Yes Where was the test doneo [facility] Constitutional Symptoms (General Health) Complaints and Symptoms: Negative for: Fever; Chills Cardiovascular Complaints and Symptoms: Positive for: LE edema Medical History: Positive for: Congestive Heart Failure; Coronary Artery Disease; Hypertension; Peripheral Venous Disease Negative for: Angina; Arrhythmia; Deep Vein Thrombosis; Hypotension; Myocardial Infarction; Peripheral Arterial Disease; Phlebitis; Vasculitis Past Medical History Notes: varicose veins Eyes Medical History: Negative for: Cataracts; Glaucoma; Optic  Neuritis Ear/Nose/Mouth/Throat Medical History: Negative for: Chronic sinus problems/congestion; Middle ear problems Hematologic/Lymphatic Medical History: Positive for: Anemia - aplastic anemia; Lymphedema Negative for: Hemophilia; Human Immunodeficiency Virus; Sickle Cell Disease Respiratory Adam Merritt, Adam Merritt. ([id number]) Complaints and Symptoms: No Complaints or Symptoms Medical History: Positive for: Chronic Obstructive Pulmonary Disease (COPD); Sleep Apnea Negative for: Aspiration; Asthma; Pneumothorax; Tuberculosis Gastrointestinal Medical History: Negative for: Cirrhosis ; Colitis; Crohnos; Hepatitis A; Hepatitis B; Hepatitis C Endocrine Medical History: Positive for: Type II Diabetes Negative for: Type I Diabetes Time with diabetes: since 2006 Treated with: Insulin Blood sugar tested every day: Yes Tested : Blood sugar testing results: Breakfast: 209 Genitourinary Medical History: Negative for: End Stage Renal Disease Immunological Medical History: Negative for: Lupus Erythematosus; Raynaudos; Scleroderma Integumentary (Skin) Medical History: Negative for: History of Burn; History of pressure wounds Musculoskeletal Medical History: Positive for: Osteoarthritis Negative for: Gout; Rheumatoid Arthritis; Osteomyelitis Neurologic Medical History: Positive for: Neuropathy Negative for: Dementia; Quadriplegia; Paraplegia; Seizure Disorder Past Medical History Notes: CVA - 1992 Oncologic Medical History: Positive for: Received Chemotherapy - last dose 2006 Negative for: Received Radiation Adam Merritt, Adam Merritt ([id number]) Psychiatric Complaints and Symptoms:  No Complaints or Symptoms Medical History: Negative for: Anorexia/bulimia; Confinement Anxiety Immunizations Pneumococcal Vaccine: Received Pneumococcal Vaccination: Yes Implantable Devices Hospitalization / Surgery History Name of Hospital Purpose of Hospitalization/Surgery Date [facility] bronchitis  11/03/2013 Family and Social History Cancer: Yes - Paternal Grandparents; Diabetes: No; Heart Disease: No; Hereditary Spherocytosis: No; Hypertension: No; Kidney Disease: Yes - Siblings; Lung Disease: Yes - Mother; Seizures: No; Stroke: No; Thyroid Problems: No; Tuberculosis: No; Never smoker; Marital Status - Married; Alcohol Use: Never; Drug Use: No History; Caffeine Use: Never; Financial Concerns: No; Food, Clothing or Shelter Needs: No; Support System Lacking: No; Transportation Concerns: No; Advanced Directives: No; Patient does not want information on Advanced Directives Physician Affirmation I have reviewed and agree with the above information. Electronic Signature(s) Signed: 07/20/2017 4:58:58 PM By: Montey Hora Signed: 07/23/2017 8:04:39 AM By: Worthy Keeler PA-C Entered By: Worthy Keeler on 07/20/2017 13:26:40 Adam Merritt ([id number]) -------------------------------------------------------------------------------- SuperBill Details Patient Name: Adam Merritt Date of Service: 07/20/2017 Medical Record Number: [id number] Patient Account Number: [id number] Date of Birth/Sex: July 15, 1945 (72 y.o. 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 I89.0 Lymphedema, not elsewhere classified E66.01 Morbid (severe) obesity due to excess calories L97.222 Non-pressure chronic ulcer of left calf with fat layer exposed L89.623 Pressure ulcer of left heel, stage 3 Facility Procedures CPT4 Code: [id number] Description: [phone] - WOUND CARE VISIT-LEV 2 EST PT Modifier: Quantity: 1 Physician Procedures CPT4 Code: 1583094 Description: 07680 - WC PHYS LEVEL 2 - EST PT ICD-10 Diagnosis Description E11.620 Type 2 diabetes mellitus with diabetic  dermatitis I89.0 Lymphedema, not elsewhere classified E66.01 Morbid (severe) obesity due to excess calories L97.222 Non-pressure  chronic ulcer of left calf with fat layer expo Modifier: sed Quantity: 1 Electronic Signature(s) Signed: 07/20/2017 2:52:21 PM By: Montey Hora Signed: 07/23/2017 8:04:39 AM By: Worthy Keeler PA-C Entered By: Montey Hora on 07/20/2017 14:52:20

## 2017-07-26 ENCOUNTER — Telehealth: Payer: Self-pay

## 2017-07-26 ENCOUNTER — Other Ambulatory Visit: Payer: Self-pay

## 2017-07-26 MED ORDER — FUROSEMIDE 40 MG PO TABS: 40.0000 mg | ORAL_TABLET | Freq: Every day | ORAL | 1 refills | Status: DC

## 2017-07-26 NOTE — Telephone Encounter (Signed)
Called and advised pt was having extreme swelling in lower extremities (legs) and pt has been taking lasix 20mg  once daily.  I spoke to [facility] and she approved for pt to take lasix 40 mg once daily for the swelling.  Informed nurse of changes.  dbs

## 2017-08-10 ENCOUNTER — Telehealth: Payer: Self-pay

## 2017-08-10 NOTE — Telephone Encounter (Signed)
Adam Merritt with [facility] requesting verbal orders for PT for 2 x wk for 4 wks.  I gave approval to go ahead.  dbs

## 2017-08-13 ENCOUNTER — Encounter: Payer: Self-pay | Admitting: Nurse Practitioner

## 2017-08-13 ENCOUNTER — Ambulatory Visit (INDEPENDENT_AMBULATORY_CARE_PROVIDER_SITE_OTHER): Payer: Medicare Other | Admitting: Nurse Practitioner

## 2017-08-13 VITALS — BP 159/79 | HR 87 | Resp 16 | Ht 66.0 in | Wt 290.0 lb

## 2017-08-13 DIAGNOSIS — N183 Chronic kidney disease, stage 3 unspecified: Secondary | ICD-10-CM

## 2017-08-13 DIAGNOSIS — E119 Type 2 diabetes mellitus without complications: Secondary | ICD-10-CM

## 2017-08-13 DIAGNOSIS — Z794 Long term (current) use of insulin: Secondary | ICD-10-CM

## 2017-08-13 DIAGNOSIS — E1142 Type 2 diabetes mellitus with diabetic polyneuropathy: Secondary | ICD-10-CM

## 2017-08-13 DIAGNOSIS — L209 Atopic dermatitis, unspecified: Secondary | ICD-10-CM | POA: Diagnosis not present

## 2017-08-13 DIAGNOSIS — G894 Chronic pain syndrome: Secondary | ICD-10-CM

## 2017-08-13 DIAGNOSIS — IMO0001 Reserved for inherently not codable concepts without codable children: Secondary | ICD-10-CM

## 2017-08-13 MED ORDER — OXYCODONE HCL 10 MG PO TABS: 10.0000 mg | ORAL_TABLET | Freq: Three times a day (TID) | ORAL | 0 refills | Status: DC | PRN

## 2017-08-13 MED ORDER — FENTANYL 50 MCG/HR TD PT72: 50.0000 ug | MEDICATED_PATCH | TRANSDERMAL | 0 refills | Status: DC

## 2017-08-13 MED ORDER — CLOBETASOL PROPIONATE 0.05 % EX OINT: TOPICAL_OINTMENT | CUTANEOUS | 3 refills | Status: DC

## 2017-08-13 NOTE — Progress Notes (Signed)
[facility] [place], [place] [address]  Internal MEDICINE  Office Visit Note  Patient Name: Adam Merritt  [id number]  [id number]  Date of Service: 09/02/2017  No chief complaint on file.   The patient is here for routine follow up exam. Today, he is c/o left foot pain. It is red and itchy. Has not been using prescription medication for dermatitis. Also states that he "just isn't feeling well" today. When discussing this with patient, discovered that he took wrong medication this morning. He accidentally took his wife's medication. Pill boxes are identical, however, labeled on the bottom with their names. This is the first time this has happened.  The patient was recently seen at [facility] after being seen at ER due to dehydration. Was found to have elevated renal functions and low potassium. Prior lab check 07/09/2017, did show elevated renal functions but potassium was normal. Was instructed to double his potassium dosing, but this has not arrived at his home yet.     Pt is here for routine follow up.    Current Medication: Outpatient Encounter Medications as of 08/13/2017  Medication Sig  . albuterol (PROVENTIL HFA;VENTOLIN HFA) 108 (90 BASE) MCG/ACT inhaler Inhale 1 puff into the lungs every 6 (six) hours as needed for wheezing or shortness of breath.  Marland Kitchen atorvastatin (LIPITOR) 40 MG tablet Take 40 mg by mouth at bedtime.   . carvedilol (COREG) 25 MG tablet Take 25 mg by mouth 2 (two) times daily with a meal.  . clobetasol ointment (TEMOVATE) 0.05 % Apply to all affected areas on lower legs and feet bid  . fentaNYL (DURAGESIC - DOSED MCG/HR) 50 MCG/HR Place 1 patch (50 mcg total) onto the skin every 3 (three) days.  Marland Kitchen gabapentin (NEURONTIN) 400 MG capsule Take 400 mg by mouth 3 (three) times daily.   . hydrALAZINE (APRESOLINE) 25 MG tablet Take 25 mg by mouth 3 (three) times daily.  Marland Kitchen ibuprofen (ADVIL,MOTRIN) 600 MG tablet Take 600 mg by mouth 2 (two) times daily as  needed.  Marland Kitchen ipratropium-albuterol (DUONEB) 0.5-2.5 (3) MG/3ML SOLN Take 3 mLs by nebulization every 4 (four) hours as needed (for shortness of breath).  . Liraglutide (VICTOZA) 18 MG/3ML SOPN Inject 1.8 mg into the skin daily.   . Multiple Vitamin (MULTIVITAMIN WITH MINERALS) TABS tablet Take 1 tablet by mouth daily.  Marland Kitchen omeprazole (PRILOSEC) 20 MG capsule Take 20 mg by mouth daily.  . ondansetron (ZOFRAN) 8 MG tablet Take 1 tablet (8 mg total) by mouth every 8 (eight) hours as needed for nausea or vomiting.  . Oxycodone HCl 10 MG TABS Take 1 tablet (10 mg total) by mouth 3 (three) times daily as needed.  . pentoxifylline (TRENTAL) 400 MG CR tablet Take 400 mg by mouth 3 (three) times daily.  Marland Kitchen zolpidem (AMBIEN CR) 12.5 MG CR tablet Take 1 tablet (12.5 mg total) by mouth at bedtime as needed for sleep.  . [DISCONTINUED] aspirin EC 81 MG EC tablet Take 1 tablet (81 mg total) by mouth daily. (Patient not taking: Reported on 08/29/2017)  . [DISCONTINUED] clobetasol ointment (TEMOVATE) 9.03 % Apply 1 application topically 2 (two) times daily. Pt applies to lower legs.  . [DISCONTINUED] clopidogrel (PLAVIX) 75 MG tablet Take 1 tablet (75 mg total) by mouth daily. (Patient not taking: Reported on 08/29/2017)  . [DISCONTINUED] fentaNYL (DURAGESIC - DOSED MCG/HR) 50 MCG/HR Place 1 patch (50 mcg total) onto the skin every 3 (three) days.  . [DISCONTINUED] furosemide (LASIX)  40 MG tablet Take 1 tablet (40 mg total) by mouth daily.  . [DISCONTINUED] hydrocortisone 2.5 % cream Apply topically 2 (two) times daily.  . [DISCONTINUED] metolazone (ZAROXOLYN) 2.5 MG tablet Take 2.5 mg by mouth daily.  . [DISCONTINUED] montelukast (SINGULAIR) 10 MG tablet Take 10 mg by mouth daily as needed (for allergies).   . [DISCONTINUED] ondansetron (ZOFRAN ODT) 4 MG disintegrating tablet Take 1 tablet (4 mg total) by mouth every 8 (eight) hours as needed for nausea or vomiting. (Patient not taking: Reported on 08/29/2017)  .  [DISCONTINUED] Oxycodone HCl 10 MG TABS Take 1 tablet (10 mg total) by mouth 3 (three) times daily as needed.  . [DISCONTINUED] predniSONE (DELTASONE) 10 MG tablet Take 1 tablet (10 mg total) by mouth daily with breakfast. (Patient not taking: Reported on 08/29/2017)   No facility-administered encounter medications on file as of 08/13/2017.     Surgical History: Past Surgical History:  Procedure Laterality Date  . CHOLECYSTECTOMY    . CORONARY ARTERY BYPASS GRAFT      Medical History: Past Medical History:  Diagnosis Date  . Anemia   . CAD (coronary artery disease)   . Chest pain   . Coronary artery disease   . Diabetes mellitus without complication ([place])   . Esophageal reflux   . Herpes zoster without mention of complication   . Hyperlipidemia   . Hypertension   . Insomnia   . Lumbago   . Neuropathy in diabetes ([place])   . Obstructive chronic bronchitis without exacerbation ([place])   . Other malaise and fatigue   . Stroke ([place])   . Varicose veins     Family History: Family History  Problem Relation Age of Onset  . Diabetes Mother   . Hyperlipidemia Mother   . Hypertension Mother   . Diabetes Father   . Hyperlipidemia Father   . Hypertension Father     Social History   Socioeconomic History  . Marital status: Married    Spouse name: Not on file  . Number of children: Not on file  . Years of education: Not on file  . Highest education level: Not on file  Occupational History  . Not on file  Social Needs  . Financial resource strain: Not on file  . Food insecurity:    Worry: Not on file    Inability: Not on file  . Transportation needs:    Medical: Not on file    Non-medical: Not on file  Tobacco Use  . Smoking status: Never Smoker  . Smokeless tobacco: Never Used  Substance and Sexual Activity  . Alcohol use: No  . Drug use: No  . Sexual activity: Not on file  Lifestyle  . Physical activity:    Days per week: Not on file    Minutes per session: Not on  file  . Stress: Not on file  Relationships  . Social connections:    Talks on phone: Not on file    Gets together: Not on file    Attends religious service: Not on file    Active member of club or organization: Not on file    Attends meetings of clubs or organizations: Not on file    Relationship status: Not on file  . Intimate partner violence:    Fear of current or ex partner: Not on file    Emotionally abused: Not on file    Physically abused: Not on file    Forced sexual activity: Not on file  Other Topics Concern  . Not on file  Social History Narrative  . Not on file      Review of Systems  Constitutional: Positive for fatigue. Negative for activity change, chills and unexpected weight change.  HENT: Negative for congestion, postnasal drip, rhinorrhea, sneezing and sore throat.   Eyes: Negative.  Negative for redness.  Respiratory: Positive for shortness of breath. Negative for cough and chest tightness.   Cardiovascular: Positive for leg swelling. Negative for chest pain and palpitations.  Gastrointestinal: Negative for abdominal pain, constipation, diarrhea, nausea and vomiting.  Endocrine:       Overall, blood sugar control is improved.   Genitourinary: Negative for dysuria and frequency.  Musculoskeletal: Positive for arthralgias, joint swelling and myalgias. Negative for back pain and neck pain.  Skin: Positive for rash.  Allergic/Immunologic: Negative for environmental allergies, food allergies and immunocompromised state.  Neurological: Positive for weakness. Negative for tremors and numbness.  Hematological: Negative for adenopathy. Does not bruise/bleed easily.  Psychiatric/Behavioral: Negative for sleep disturbance and suicidal ideas. Behavioral problem: Depression.    Today's Vitals   08/13/17 1423  BP: (!) 159/79  Pulse: 87  Resp: 16  SpO2: 98%  Weight: 290 lb (131.5 kg)  Height: 5\' 6"  (1.676 m)    Physical Exam  Constitutional: He is oriented to  person, place, and time. He appears well-developed and well-nourished. No distress.  HENT:  Head: Normocephalic and atraumatic.  Mouth/Throat: Oropharynx is clear and moist. No oropharyngeal exudate.  Eyes: EOM are normal. Pupils are equal, round, and reactive to light.  Neck: Normal range of motion. Neck supple. No JVD present. No tracheal deviation present. No thyromegaly present.  Cardiovascular: Normal rate and regular rhythm. Exam reveals decreased pulses. Exam reveals no gallop and no friction rub.  Murmur heard.  Systolic murmur is present with a grade of 2/6. Pulses:      Dorsalis pedis pulses are 1+ on the right side, and 1+ on the left side.  Bilateral lower extremity swelling with redness and warmth of both lower legs. Pulses are present but diminished, bilaterally.   Pulmonary/Chest: Effort normal and breath sounds normal. No respiratory distress. He has no wheezes. He has no rales. He exhibits no tenderness.  Abdominal: Soft. Bowel sounds are normal. There is no tenderness.  Musculoskeletal:  Moderate to severe lower leg pain, bilaterally. Worse when standing and putting pressure on both feet. Walking increases pain.   Lymphadenopathy:    He has no cervical adenopathy.  Neurological: He is alert and oriented to person, place, and time. No cranial nerve deficit.  Skin: Skin is warm and dry. Rash noted. He is not diaphoretic. There is erythema.  Redness, warmth, and tenderness of the skin in bilateral lower extremities.   Psychiatric: He has a normal mood and affect. His behavior is normal. Judgment and thought content normal.  Nursing note and vitals reviewed.  Assessment/Plan: 1. Atopic dermatitis, unspecified type - clobetasol ointment (TEMOVATE) 0.05 %; Apply to all affected areas on lower legs and feet bid  Dispense: 60 g; Refill: 3  2. Insulin dependent diabetes mellitus (HCC) Continue diabetic medications and insulin at current dose.   3. Diabetic polyneuropathy  associated with type 2 diabetes mellitus (HCC) Continue gabapentin as prescribed   4. CKD (chronic kidney disease), stage III (HCC) Elevated renal functioins at recent hospitalization, likely from dehydration. Recheck BMP and CBC for further evaluation.  - Basic Metabolic Panel (BMET) - CBC  5. Chronic pain disorder Continue fentanyl patch, replacing  this every third day. Use oxycodone as needed and as prescribed. For breakthrough pain. New prescriptions for both were sent to the patient's pharmacy.  - fentaNYL (DURAGESIC - DOSED MCG/HR) 50 MCG/HR; Place 1 patch (50 mcg total) onto the skin every 3 (three) days.  Dispense: 10 patch; Refill: 0 - Oxycodone HCl 10 MG TABS; Take 1 tablet (10 mg total) by mouth 3 (three) times daily as needed.  Dispense: 90 tablet; Refill: 0  General Counseling: Dayle verbalizes understanding of the findings of todays visit and agrees with plan of treatment. I have discussed any further diagnostic evaluation that may be needed or ordered today. We also reviewed his medications today. he has been encouraged to call the office with any questions or concerns that should arise related to todays visit.  Reviewed risks and possible side effects associated with taking opiates and benzodiazepines. Combination of these could cause dizziness and drowsiness. Advised him not to drive or operate machinery when taking these medications, as he could put his life and the lives of others at risk. He voiced understanding.   This patient was seen by [name], FNP- C in Collaboration with Dr [name] as a part of collaborative care agreement    Orders Placed This Encounter  Procedures  . Basic Metabolic Panel (BMET)  . CBC    Meds ordered this encounter  Medications  . clobetasol ointment (TEMOVATE) 0.05 %    Sig: Apply to all affected areas on lower legs and feet bid    Dispense:  60 g    Refill:  3    Order Specific Question:   Supervising Provider    Answer:    [name] [place]  . fentaNYL (DURAGESIC - DOSED MCG/HR) 50 MCG/HR    Sig: Place 1 patch (50 mcg total) onto the skin every 3 (three) days.    Dispense:  10 patch    Refill:  0    Order Specific Question:   Supervising Provider    Answer:   [name] [[id number]]  . Oxycodone HCl 10 MG TABS    Sig: Take 1 tablet (10 mg total) by mouth 3 (three) times daily as needed.    Dispense:  90 tablet    Refill:  0    Order Specific Question:   Supervising Provider    Answer:   [name] [[id number]]    Time spent: 52  Minutes     Dr [name] Internal medicine

## 2017-08-29 ENCOUNTER — Emergency Department: Payer: Medicare Other

## 2017-08-29 ENCOUNTER — Inpatient Hospital Stay: Payer: Medicare Other

## 2017-08-29 ENCOUNTER — Inpatient Hospital Stay
Admission: EM | Admit: 2017-08-29 | Discharge: 2017-08-31 | DRG: 563 | Disposition: A | Discharge status: Home Health | Payer: Medicare Other | Attending: Internal Medicine | Admitting: Internal Medicine

## 2017-08-29 ENCOUNTER — Other Ambulatory Visit: Payer: Self-pay

## 2017-08-29 DIAGNOSIS — E875 Hyperkalemia: Secondary | ICD-10-CM | POA: Diagnosis not present

## 2017-08-29 DIAGNOSIS — Z79899 Other long term (current) drug therapy: Secondary | ICD-10-CM

## 2017-08-29 DIAGNOSIS — E871 Hypo-osmolality and hyponatremia: Secondary | ICD-10-CM | POA: Diagnosis present

## 2017-08-29 DIAGNOSIS — N183 Chronic kidney disease, stage 3 (moderate): Secondary | ICD-10-CM | POA: Diagnosis not present

## 2017-08-29 DIAGNOSIS — Z79891 Long term (current) use of opiate analgesic: Secondary | ICD-10-CM

## 2017-08-29 DIAGNOSIS — I129 Hypertensive chronic kidney disease with stage 1 through stage 4 chronic kidney disease, or unspecified chronic kidney disease: Secondary | ICD-10-CM | POA: Diagnosis present

## 2017-08-29 DIAGNOSIS — N3000 Acute cystitis without hematuria: Secondary | ICD-10-CM | POA: Diagnosis present

## 2017-08-29 DIAGNOSIS — T502X5A Adverse effect of carbonic-anhydrase inhibitors, benzothiadiazides and other diuretics, initial encounter: Secondary | ICD-10-CM | POA: Diagnosis not present

## 2017-08-29 DIAGNOSIS — Y92009 Unspecified place in unspecified non-institutional (private) residence as the place of occurrence of the external cause: Secondary | ICD-10-CM | POA: Diagnosis not present

## 2017-08-29 DIAGNOSIS — Z7982 Long term (current) use of aspirin: Secondary | ICD-10-CM

## 2017-08-29 DIAGNOSIS — G894 Chronic pain syndrome: Secondary | ICD-10-CM | POA: Diagnosis present

## 2017-08-29 DIAGNOSIS — E876 Hypokalemia: Secondary | ICD-10-CM | POA: Diagnosis present

## 2017-08-29 DIAGNOSIS — F40240 Claustrophobia: Secondary | ICD-10-CM | POA: Diagnosis present

## 2017-08-29 DIAGNOSIS — J9811 Atelectasis: Secondary | ICD-10-CM | POA: Diagnosis present

## 2017-08-29 DIAGNOSIS — Z6841 Body Mass Index (BMI) 40.0 and over, adult: Secondary | ICD-10-CM

## 2017-08-29 DIAGNOSIS — W19XXXA Unspecified fall, initial encounter: Secondary | ICD-10-CM | POA: Diagnosis not present

## 2017-08-29 DIAGNOSIS — J9611 Chronic respiratory failure with hypoxia: Secondary | ICD-10-CM | POA: Diagnosis not present

## 2017-08-29 DIAGNOSIS — R600 Localized edema: Secondary | ICD-10-CM | POA: Diagnosis present

## 2017-08-29 DIAGNOSIS — Z8673 Personal history of transient ischemic attack (TIA), and cerebral infarction without residual deficits: Secondary | ICD-10-CM

## 2017-08-29 DIAGNOSIS — E1122 Type 2 diabetes mellitus with diabetic chronic kidney disease: Secondary | ICD-10-CM | POA: Diagnosis present

## 2017-08-29 DIAGNOSIS — J90 Pleural effusion, not elsewhere classified: Secondary | ICD-10-CM | POA: Diagnosis present

## 2017-08-29 DIAGNOSIS — E878 Other disorders of electrolyte and fluid balance, not elsewhere classified: Secondary | ICD-10-CM | POA: Diagnosis present

## 2017-08-29 DIAGNOSIS — M545 Low back pain: Secondary | ICD-10-CM | POA: Diagnosis present

## 2017-08-29 DIAGNOSIS — M25572 Pain in left ankle and joints of left foot: Secondary | ICD-10-CM | POA: Diagnosis not present

## 2017-08-29 DIAGNOSIS — M623 Immobility syndrome (paraplegic): Secondary | ICD-10-CM | POA: Diagnosis present

## 2017-08-29 DIAGNOSIS — D72828 Other elevated white blood cell count: Secondary | ICD-10-CM | POA: Diagnosis present

## 2017-08-29 DIAGNOSIS — J449 Chronic obstructive pulmonary disease, unspecified: Secondary | ICD-10-CM | POA: Diagnosis present

## 2017-08-29 DIAGNOSIS — Z9989 Dependence on other enabling machines and devices: Secondary | ICD-10-CM

## 2017-08-29 DIAGNOSIS — N179 Acute kidney failure, unspecified: Secondary | ICD-10-CM | POA: Diagnosis present

## 2017-08-29 DIAGNOSIS — Z9119 Patient's noncompliance with other medical treatment and regimen: Secondary | ICD-10-CM

## 2017-08-29 DIAGNOSIS — G4700 Insomnia, unspecified: Secondary | ICD-10-CM | POA: Diagnosis present

## 2017-08-29 DIAGNOSIS — I2510 Atherosclerotic heart disease of native coronary artery without angina pectoris: Secondary | ICD-10-CM | POA: Diagnosis present

## 2017-08-29 DIAGNOSIS — R296 Repeated falls: Secondary | ICD-10-CM | POA: Diagnosis present

## 2017-08-29 DIAGNOSIS — M109 Gout, unspecified: Secondary | ICD-10-CM | POA: Diagnosis not present

## 2017-08-29 DIAGNOSIS — E860 Dehydration: Secondary | ICD-10-CM | POA: Diagnosis not present

## 2017-08-29 DIAGNOSIS — E785 Hyperlipidemia, unspecified: Secondary | ICD-10-CM | POA: Diagnosis not present

## 2017-08-29 DIAGNOSIS — E1142 Type 2 diabetes mellitus with diabetic polyneuropathy: Secondary | ICD-10-CM | POA: Diagnosis not present

## 2017-08-29 DIAGNOSIS — Z794 Long term (current) use of insulin: Secondary | ICD-10-CM

## 2017-08-29 DIAGNOSIS — D631 Anemia in chronic kidney disease: Secondary | ICD-10-CM | POA: Diagnosis not present

## 2017-08-29 DIAGNOSIS — Z8619 Personal history of other infectious and parasitic diseases: Secondary | ICD-10-CM

## 2017-08-29 DIAGNOSIS — S8391XA Sprain of unspecified site of right knee, initial encounter: Secondary | ICD-10-CM | POA: Diagnosis not present

## 2017-08-29 DIAGNOSIS — G4733 Obstructive sleep apnea (adult) (pediatric): Secondary | ICD-10-CM | POA: Diagnosis present

## 2017-08-29 DIAGNOSIS — K219 Gastro-esophageal reflux disease without esophagitis: Secondary | ICD-10-CM | POA: Diagnosis present

## 2017-08-29 DIAGNOSIS — I872 Venous insufficiency (chronic) (peripheral): Secondary | ICD-10-CM | POA: Diagnosis present

## 2017-08-29 DIAGNOSIS — Z951 Presence of aortocoronary bypass graft: Secondary | ICD-10-CM

## 2017-08-29 LAB — COMPREHENSIVE METABOLIC PANEL
ALT: 26 U/L (ref 17–63)
ANION GAP: 15 (ref 5–15)
AST: 48 U/L — ABNORMAL HIGH (ref 15–41)
Albumin: 3.5 g/dL (ref 3.5–5.0)
Alkaline Phosphatase: 71 U/L (ref 38–126)
BUN: 44 mg/dL — ABNORMAL HIGH (ref 6–20)
CHLORIDE: 91 mmol/L — AB (ref 101–111)
CO2: 28 mmol/L (ref 22–32)
Calcium: 8.6 mg/dL — ABNORMAL LOW (ref 8.9–10.3)
Creatinine, Ser: 2.43 mg/dL — ABNORMAL HIGH (ref 0.61–1.24)
GFR calc Af Amer: 29 mL/min — ABNORMAL LOW (ref >60)
GFR calc non Af Amer: 25 mL/min — ABNORMAL LOW (ref >60)
Glucose, Bld: 168 mg/dL — ABNORMAL HIGH (ref 65–99)
Potassium: 2.9 mmol/L — ABNORMAL LOW (ref 3.5–5.1)
SODIUM: 134 mmol/L — AB (ref 135–145)
Total Bilirubin: 1.1 mg/dL (ref 0.3–1.2)
Total Protein: 7.9 g/dL (ref 6.5–8.1)

## 2017-08-29 LAB — CBC WITH DIFFERENTIAL/PLATELET
BASOS ABS: 0 10*3/uL (ref 0–0.1)
Basophils Relative: 0 %
EOS ABS: 0.2 10*3/uL (ref 0–0.7)
Eosinophils Relative: 2 %
HCT: 38.8 % — ABNORMAL LOW (ref 40.0–52.0)
HEMOGLOBIN: 13.3 g/dL (ref 13.0–18.0)
LYMPHS ABS: 1.1 10*3/uL (ref 1.0–3.6)
LYMPHS PCT: 8 %
MCH: 32.2 pg (ref 26.0–34.0)
MCHC: 34.3 g/dL (ref 32.0–36.0)
MCV: 93.7 fL (ref 80.0–100.0)
Monocytes Absolute: 1 10*3/uL (ref 0.2–1.0)
Monocytes Relative: 7 %
NEUTROS PCT: 83 %
Neutro Abs: 11.6 10*3/uL — ABNORMAL HIGH (ref 1.4–6.5)
Platelets: 184 10*3/uL (ref 150–440)
RBC: 4.14 MIL/uL — AB (ref 4.40–5.90)
RDW: 13.5 % (ref 11.5–14.5)
WBC: 14 10*3/uL — AB (ref 3.8–10.6)

## 2017-08-29 LAB — URINALYSIS, COMPLETE (UACMP) WITH MICROSCOPIC
BACTERIA UA: NONE SEEN
BILIRUBIN URINE: NEGATIVE
Glucose, UA: NEGATIVE mg/dL
Ketones, ur: NEGATIVE mg/dL
LEUKOCYTES UA: NEGATIVE
NITRITE: NEGATIVE
PROTEIN: NEGATIVE mg/dL
Specific Gravity, Urine: 1.012 (ref 1.005–1.030)
pH: 6 (ref 5.0–8.0)

## 2017-08-29 LAB — MAGNESIUM: MAGNESIUM: 2.1 mg/dL (ref 1.7–2.4)

## 2017-08-29 LAB — SYNOVIAL CELL COUNT + DIFF, W/ CRYSTALS
EOSINOPHILS-SYNOVIAL: 0 %
LYMPHOCYTES-SYNOVIAL FLD: 3 %
MONOCYTE-MACROPHAGE-SYNOVIAL FLUID: 3 %
Neutrophil, Synovial: 94 %
OTHER CELLS-SYN: 0
WBC, Synovial: 13482 /mm3 — ABNORMAL HIGH (ref 0–200)

## 2017-08-29 LAB — HEMOGLOBIN A1C
HEMOGLOBIN A1C: 6.6 % — AB (ref 4.8–5.6)
MEAN PLASMA GLUCOSE: 142.72 mg/dL

## 2017-08-29 LAB — BRAIN NATRIURETIC PEPTIDE: B NATRIURETIC PEPTIDE 5: 27 pg/mL (ref 0.0–100.0)

## 2017-08-29 LAB — URIC ACID: URIC ACID, SERUM: 16.5 mg/dL — AB (ref 4.4–7.6)

## 2017-08-29 LAB — TROPONIN I

## 2017-08-29 LAB — GLUCOSE, CAPILLARY: GLUCOSE-CAPILLARY: 129 mg/dL — AB (ref 65–99)

## 2017-08-29 MED ORDER — GABAPENTIN 400 MG PO CAPS
400.0000 mg | ORAL_CAPSULE | Freq: Three times a day (TID) | ORAL | Status: DC
  Administered 2017-08-29 – 2017-08-31 (×6): 400 mg via ORAL
  Filled 2017-08-29 (×7): qty 1

## 2017-08-29 MED ORDER — ATORVASTATIN CALCIUM 20 MG PO TABS
40.0000 mg | ORAL_TABLET | Freq: Every day | ORAL | Status: DC
  Administered 2017-08-29 – 2017-08-30 (×2): 40 mg via ORAL
  Filled 2017-08-29 (×2): qty 2

## 2017-08-29 MED ORDER — BUPIVACAINE HCL (PF) 0.5 % IJ SOLN
10.0000 mL | Freq: Once | INTRAMUSCULAR | Status: DC
  Filled 2017-08-29: qty 10

## 2017-08-29 MED ORDER — POTASSIUM CHLORIDE IN NACL 20-0.9 MEQ/L-% IV SOLN
INTRAVENOUS | Status: AC
  Administered 2017-08-29 – 2017-08-30 (×2): via INTRAVENOUS
  Filled 2017-08-29 (×2): qty 1000

## 2017-08-29 MED ORDER — ACETAMINOPHEN 650 MG RE SUPP: 650.0000 mg | Freq: Four times a day (QID) | RECTAL | Status: DC | PRN

## 2017-08-29 MED ORDER — POTASSIUM CHLORIDE 20 MEQ PO PACK
40.0000 meq | PACK | Freq: Once | ORAL | Status: AC
  Administered 2017-08-29: 40 meq via ORAL
  Filled 2017-08-29: qty 2

## 2017-08-29 MED ORDER — ALBUTEROL SULFATE (2.5 MG/3ML) 0.083% IN NEBU: 2.5000 mg | INHALATION_SOLUTION | Freq: Four times a day (QID) | RESPIRATORY_TRACT | Status: DC | PRN

## 2017-08-29 MED ORDER — ONDANSETRON HCL 4 MG PO TABS: 4.0000 mg | ORAL_TABLET | Freq: Four times a day (QID) | ORAL | Status: DC | PRN

## 2017-08-29 MED ORDER — HEPARIN SODIUM (PORCINE) 5000 UNIT/ML IJ SOLN
5000.0000 [IU] | Freq: Three times a day (TID) | INTRAMUSCULAR | Status: DC
  Administered 2017-08-29 – 2017-08-31 (×5): 5000 [IU] via SUBCUTANEOUS
  Filled 2017-08-29 (×6): qty 1

## 2017-08-29 MED ORDER — ONDANSETRON HCL 4 MG PO TABS: 8.0000 mg | ORAL_TABLET | Freq: Three times a day (TID) | ORAL | Status: DC | PRN

## 2017-08-29 MED ORDER — FLEET ENEMA 7-19 GM/118ML RE ENEM: 1.0000 | ENEMA | Freq: Once | RECTAL | Status: DC

## 2017-08-29 MED ORDER — ONDANSETRON HCL 4 MG/2ML IJ SOLN: 4.0000 mg | Freq: Four times a day (QID) | INTRAMUSCULAR | Status: DC | PRN

## 2017-08-29 MED ORDER — SODIUM CHLORIDE 0.9 % IV SOLN
Freq: Once | INTRAVENOUS | Status: AC
  Administered 2017-08-29: 09:00:00 via INTRAVENOUS

## 2017-08-29 MED ORDER — TRIAMCINOLONE ACETONIDE 40 MG/ML IJ SUSP
80.0000 mg | Freq: Once | INTRAMUSCULAR | Status: DC
  Filled 2017-08-29: qty 2

## 2017-08-29 MED ORDER — ALBUTEROL SULFATE HFA 108 (90 BASE) MCG/ACT IN AERS: 1.0000 | INHALATION_SPRAY | Freq: Four times a day (QID) | RESPIRATORY_TRACT | Status: DC | PRN

## 2017-08-29 MED ORDER — IPRATROPIUM-ALBUTEROL 0.5-2.5 (3) MG/3ML IN SOLN: 3.0000 mL | RESPIRATORY_TRACT | Status: DC | PRN

## 2017-08-29 MED ORDER — FENTANYL 50 MCG/HR TD PT72
50.0000 ug | MEDICATED_PATCH | TRANSDERMAL | Status: DC
  Administered 2017-08-29: 50 ug via TRANSDERMAL
  Filled 2017-08-29: qty 1

## 2017-08-29 MED ORDER — CARVEDILOL 25 MG PO TABS
25.0000 mg | ORAL_TABLET | Freq: Two times a day (BID) | ORAL | Status: DC
  Administered 2017-08-29 – 2017-08-31 (×4): 25 mg via ORAL
  Filled 2017-08-29 (×4): qty 1

## 2017-08-29 MED ORDER — POTASSIUM CHLORIDE CRYS ER 20 MEQ PO TBCR
40.0000 meq | EXTENDED_RELEASE_TABLET | Freq: Once | ORAL | Status: AC
  Administered 2017-08-29: 40 meq via ORAL
  Filled 2017-08-29: qty 2

## 2017-08-29 MED ORDER — ACETAMINOPHEN 325 MG PO TABS: 650.0000 mg | ORAL_TABLET | Freq: Four times a day (QID) | ORAL | Status: DC | PRN

## 2017-08-29 MED ORDER — BISACODYL 10 MG RE SUPP: 10.0000 mg | Freq: Every day | RECTAL | Status: DC | PRN

## 2017-08-29 MED ORDER — POTASSIUM CHLORIDE CRYS ER 20 MEQ PO TBCR
20.0000 meq | EXTENDED_RELEASE_TABLET | Freq: Two times a day (BID) | ORAL | Status: DC
  Administered 2017-08-29 – 2017-08-31 (×5): 20 meq via ORAL
  Filled 2017-08-29 (×5): qty 1

## 2017-08-29 MED ORDER — CLOBETASOL PROPIONATE 0.05 % EX OINT: TOPICAL_OINTMENT | Freq: Two times a day (BID) | CUTANEOUS | Status: DC | PRN

## 2017-08-29 MED ORDER — COLCHICINE 0.6 MG PO TABS
0.6000 mg | ORAL_TABLET | Freq: Two times a day (BID) | ORAL | Status: AC
  Administered 2017-08-29 – 2017-08-30 (×4): 0.6 mg via ORAL
  Filled 2017-08-29 (×4): qty 1

## 2017-08-29 MED ORDER — ASPIRIN EC 325 MG PO TBEC
325.0000 mg | DELAYED_RELEASE_TABLET | Freq: Every day | ORAL | Status: DC
  Administered 2017-08-29 – 2017-08-31 (×3): 325 mg via ORAL
  Filled 2017-08-29 (×3): qty 1

## 2017-08-29 MED ORDER — PANTOPRAZOLE SODIUM 40 MG PO TBEC
40.0000 mg | DELAYED_RELEASE_TABLET | Freq: Every day | ORAL | Status: DC
  Administered 2017-08-29 – 2017-08-31 (×3): 40 mg via ORAL
  Filled 2017-08-29 (×3): qty 1

## 2017-08-29 MED ORDER — SODIUM CHLORIDE 0.9 % IV SOLN
1.0000 g | Freq: Once | INTRAVENOUS | Status: AC
  Administered 2017-08-29: 1 g via INTRAVENOUS
  Filled 2017-08-29: qty 10

## 2017-08-29 MED ORDER — INSULIN GLARGINE 100 UNIT/ML ~~LOC~~ SOLN
30.0000 [IU] | Freq: Every day | SUBCUTANEOUS | Status: DC
  Administered 2017-08-29 – 2017-08-30 (×2): 30 [IU] via SUBCUTANEOUS
  Filled 2017-08-29 (×3): qty 0.3

## 2017-08-29 MED ORDER — PENTOXIFYLLINE ER 400 MG PO TBCR
400.0000 mg | EXTENDED_RELEASE_TABLET | Freq: Three times a day (TID) | ORAL | Status: DC
  Administered 2017-08-29 – 2017-08-31 (×6): 400 mg via ORAL
  Filled 2017-08-29 (×8): qty 1

## 2017-08-29 MED ORDER — OXYCODONE HCL 5 MG PO TABS
10.0000 mg | ORAL_TABLET | Freq: Three times a day (TID) | ORAL | Status: DC | PRN
  Administered 2017-08-29 – 2017-08-30 (×2): 10 mg via ORAL
  Filled 2017-08-29 (×2): qty 2

## 2017-08-29 MED ORDER — SENNOSIDES-DOCUSATE SODIUM 8.6-50 MG PO TABS
1.0000 | ORAL_TABLET | Freq: Two times a day (BID) | ORAL | Status: DC
  Administered 2017-08-29 – 2017-08-30 (×2): 1 via ORAL
  Administered 2017-08-30: 2 via ORAL
  Administered 2017-08-31: 1 via ORAL
  Filled 2017-08-29: qty 2
  Filled 2017-08-29: qty 1
  Filled 2017-08-29 (×2): qty 2
  Filled 2017-08-29: qty 1
  Filled 2017-08-29: qty 2
  Filled 2017-08-29: qty 1
  Filled 2017-08-29: qty 2

## 2017-08-29 MED ORDER — POLYETHYLENE GLYCOL 3350 17 G PO PACK: 17.0000 g | PACK | Freq: Every day | ORAL | Status: DC | PRN

## 2017-08-29 MED ORDER — MORPHINE SULFATE (PF) 4 MG/ML IV SOLN: 4.0000 mg | INTRAVENOUS | Status: DC | PRN

## 2017-08-29 MED ORDER — HYDRALAZINE HCL 25 MG PO TABS
25.0000 mg | ORAL_TABLET | Freq: Three times a day (TID) | ORAL | Status: DC
  Administered 2017-08-29 – 2017-08-31 (×6): 25 mg via ORAL
  Filled 2017-08-29 (×7): qty 1

## 2017-08-29 MED ORDER — ADULT MULTIVITAMIN W/MINERALS CH
1.0000 | ORAL_TABLET | Freq: Every day | ORAL | Status: DC
  Administered 2017-08-29 – 2017-08-31 (×3): 1 via ORAL
  Filled 2017-08-29 (×3): qty 1

## 2017-08-29 NOTE — ED Triage Notes (Signed)
Pt arrives via ems with complaints of increasing weakness in the right leg, as well as an increase of falls recently. Ems reports they have been to pt residence multiple times for falls, ems helped pt up but refused coming to the ed. Pt complains of increasing right knee pain over the last week. Pt in not apparent distress.

## 2017-08-29 NOTE — Progress Notes (Signed)
PT Cancellation Note  Patient Details Name: Adam Merritt MRN: [id number] DOB: [birth year]   Cancelled Treatment:    Reason Eval/Treat Not Completed: Medical issues which prohibited therapy; Pt's not on cardiac monitoring with Ka currently 2.9 which is outside guidelines for participation with PT services.  Will attempt to see pt at a future date as medically appropriate.    Linus Salmons PT, DPT 08/29/17, 3:26 PM

## 2017-08-29 NOTE — ED Notes (Signed)
Recollected lavender tube

## 2017-08-29 NOTE — Consult Note (Signed)
ORTHOPAEDIC CONSULTATION  REQUESTING PHYSICIAN: Dustin Flock, MD  Chief Complaint:   Right knee pain.  History of Present Illness: Adam Merritt is a 72 y.o. male with multiple medical problems including coronary artery disease, diabetes, hyperlipidemia, hypertension, diabetic neuropathy, COPD, anemia, and chronic venous insufficiency who lives independently.  Apparently, the patient has fallen several times over the past several weeks due to weakness in his right lower extremity.  Previously, the patient had refused to come to the hospital for further workup, but because of increased right knee pain, he agreed to be brought to the hospital by the EMS folks today after they were called to help him get up from a fall.  The patient subsequently was admitted for renal insufficiency, as well as for further workup of his right knee pain.  The patient denies any loss of consciousness or dizziness that did contribute to his falls.  He also denies any associated injuries resulting from his falls.  Past Medical History:  Diagnosis Date  . Anemia   . CAD (coronary artery disease)   . Chest pain   . Coronary artery disease   . Diabetes mellitus without complication ([place])   . Esophageal reflux   . Herpes zoster without mention of complication   . Hyperlipidemia   . Hypertension   . Insomnia   . Lumbago   . Neuropathy in diabetes ([place])   . Obstructive chronic bronchitis without exacerbation ([place])   . Other malaise and fatigue   . Stroke ([place])   . Varicose veins    Past Surgical History:  Procedure Laterality Date  . CHOLECYSTECTOMY    . CORONARY ARTERY BYPASS GRAFT     Social History   Socioeconomic History  . Marital status: Married    Spouse name: Not on file  . Number of children: Not on file  . Years of education: Not on file  . Highest education level: Not on file  Occupational History  . Not on file  Social Needs   . Financial resource strain: Not on file  . Food insecurity:    Worry: Not on file    Inability: Not on file  . Transportation needs:    Medical: Not on file    Non-medical: Not on file  Tobacco Use  . Smoking status: Never Smoker  . Smokeless tobacco: Never Used  Substance and Sexual Activity  . Alcohol use: No  . Drug use: No  . Sexual activity: Not on file  Lifestyle  . Physical activity:    Days per week: Not on file    Minutes per session: Not on file  . Stress: Not on file  Relationships  . Social connections:    Talks on phone: Not on file    Gets together: Not on file    Attends religious service: Not on file    Active member of club or organization: Not on file    Attends meetings of clubs or organizations: Not on file    Relationship status: Not on file  Other Topics Concern  . Not on file  Social History Narrative  . Not on file   Family History  Problem Relation Age of Onset  . Diabetes Mother   . Hyperlipidemia Mother   . Hypertension Mother   . Diabetes Father   . Hyperlipidemia Father   . Hypertension Father    No Known Allergies Prior to Admission medications   Medication Sig Start Date End Date Taking? Authorizing Provider  albuterol (PROVENTIL  HFA;VENTOLIN HFA) 108 (90 BASE) MCG/ACT inhaler Inhale 1 puff into the lungs every 6 (six) hours as needed for wheezing or shortness of breath.   Yes [provider]  aspirin 325 MG EC tablet Take 325 mg by mouth daily.   Yes [provider]  atorvastatin (LIPITOR) 40 MG tablet Take 40 mg by mouth at bedtime.    Yes [provider]  carvedilol (COREG) 25 MG tablet Take 25 mg by mouth 2 (two) times daily with a meal.   Yes [provider]  clobetasol ointment (TEMOVATE) 0.05 % Apply to all affected areas on lower legs and feet bid 08/13/17  Yes Boscia, Heather E, NP  fentaNYL (DURAGESIC - DOSED MCG/HR) 50 MCG/HR Place 1 patch (50 mcg total) onto the skin every 3 (three) days.  08/13/17  Yes Boscia, Greer Ee, NP  furosemide (LASIX) 40 MG tablet Take 1 tablet (40 mg total) by mouth daily. 07/26/17  Yes Boscia, Greer Ee, NP  gabapentin (NEURONTIN) 400 MG capsule Take 400 mg by mouth 3 (three) times daily.    Yes [provider]  hydrALAZINE (APRESOLINE) 25 MG tablet Take 25 mg by mouth 3 (three) times daily.   Yes [provider]  ibuprofen (ADVIL,MOTRIN) 600 MG tablet Take 600 mg by mouth 2 (two) times daily as needed.   Yes [provider]  Insulin Glargine (BASAGLAR KWIKPEN) 100 UNIT/ML SOPN Inject 30 Units into the skin at bedtime.   Yes [provider]  ipratropium-albuterol (DUONEB) 0.5-2.5 (3) MG/3ML SOLN Take 3 mLs by nebulization every 4 (four) hours as needed (for shortness of breath).   Yes [provider]  Liraglutide (VICTOZA) 18 MG/3ML SOPN Inject 1.8 mg into the skin daily.    Yes [provider]  metolazone (ZAROXOLYN) 2.5 MG tablet Take 2.5 mg by mouth daily.   Yes [provider]  Multiple Vitamin (MULTIVITAMIN WITH MINERALS) TABS tablet Take 1 tablet by mouth daily.   Yes [provider]  omeprazole (PRILOSEC) 20 MG capsule Take 20 mg by mouth daily.   Yes [provider]  ondansetron (ZOFRAN) 8 MG tablet Take 1 tablet (8 mg total) by mouth every 8 (eight) hours as needed for nausea or vomiting. 07/09/17  Yes Boscia, Greer Ee, NP  Oxycodone HCl 10 MG TABS Take 1 tablet (10 mg total) by mouth 3 (three) times daily as needed. 08/13/17  Yes Boscia, Greer Ee, NP  pentoxifylline (TRENTAL) 400 MG CR tablet Take 400 mg by mouth 3 (three) times daily.   Yes [provider]  potassium chloride SA (K-DUR,KLOR-CON) 20 MEQ tablet Take 20 mEq by mouth 2 (two) times daily.   Yes [provider]  sennosides-docusate sodium (SENOKOT-S) 8.6-50 MG tablet Take 1-2 tablets by mouth 2 (two) times daily.   Yes [provider]  zolpidem (AMBIEN CR) 12.5 MG CR tablet Take 1  tablet (12.5 mg total) by mouth at bedtime as needed for sleep. 06/29/17  Yes Ronnell Freshwater, NP   Dg Ankle Complete Left  Result Date: 08/29/2017 CLINICAL DATA:  Pain following fall EXAM: LEFT ANKLE COMPLETE - 3+ VIEW COMPARISON:  None. FINDINGS: Frontal, oblique, and lateral views were obtained. There is generalized soft tissue swelling. No fracture or joint effusion. No appreciable joint space narrowing or erosion. Ankle mortise appears intact. There are spurs arising from the posterior and inferior calcaneus. IMPRESSION: Calcaneal spurs. Diffuse soft tissue swelling. No fracture. Ankle mortise appears intact. Electronically Signed   By: Gwyndolyn Saxon  Jasmine December III M.D.   On: 08/29/2017 08:20   US Renal  Result Date: 08/29/2017 CLINICAL DATA:  Acute renal failure. EXAM: RENAL / URINARY TRACT ULTRASOUND COMPLETE COMPARISON:  None. FINDINGS: Right Kidney: Length: 10.7 cm. Echogenicity within normal limits. No mass or hydronephrosis visualized. Left Kidney: Length: 12.7 cm. Echogenicity within normal limits. No mass or hydronephrosis visualized. Bladder: Appears normal for degree of bladder distention. Postvoid residual of 128 cc. IMPRESSION: 1. No acute abnormality. Normal sonographic appearance of the bilateral kidneys. 2. Postvoid bladder residual of 128 cc. Electronically Signed   By: Titus Dubin M.D.   On: 08/29/2017 15:34   Dg Chest Port 1 View  Result Date: 08/29/2017 CLINICAL DATA:  Several recent falls.  Hypertension. EXAM: PORTABLE CHEST 1 VIEW COMPARISON:  Chest radiograph August 30, 2016 and chest CT September 15, 2016 FINDINGS: There is mild atelectatic change in the left base with small left pleural effusion. Lungs elsewhere are clear. Heart is mildly enlarged with pulmonary vascularity normal. Patient is status post coronary artery bypass grafting. No adenopathy. No bone lesions. IMPRESSION: Stable cardiac prominence. Mild left base atelectasis with small left pleural effusion. Lungs elsewhere  clear. Electronically Signed   By: Lowella Grip III M.D.   On: 08/29/2017 07:27   Dg Knee Complete 4 Views Right  Result Date: 08/29/2017 CLINICAL DATA:  Pain following fall EXAM: RIGHT KNEE - COMPLETE 4+ VIEW COMPARISON:  None. FINDINGS: Frontal, lateral, and bilateral oblique views were obtained. No acute fracture or dislocation. There is a small joint effusion. There is mild narrowing medially and in the patellofemoral joint regions. No erosive change. IMPRESSION: Small joint effusion. No acute fracture or dislocation. Joint space narrowing medially and in the patellofemoral joint regions. Electronically Signed   By: Lowella Grip III M.D.   On: 08/29/2017 08:20    Positive ROS: All other systems have been reviewed and were otherwise negative with the exception of those mentioned in the HPI and as above.  Physical Exam: General:  Alert, no acute distress Psychiatric:  Patient is competent for consent with normal mood and affect   Cardiovascular:  No pedal edema Respiratory:  No wheezing, non-labored breathing GI:  Abdomen is soft and non-tender Skin:  No lesions in the area of chief complaint Neurologic:  Sensation intact distally Lymphatic:  No axillary or cervical lymphadenopathy  Orthopedic Exam:  Orthopedic examination is limited to the right knee.  Skin inspection around the right knee is unremarkable.  There is mild swelling, but no erythema, ecchymosis, abrasions, or other skin abnormalities identified.  He does appear to have a 1+ effusion.  The patient is able to perform a straight leg raise and is able to actively extend his knee fully and flex beyond 90 degrees with only minimal discomfort.  The knee is stable to varus and valgus stressing.  He has a negative Lachman's test.  His patella tracks well and is without crepitance.  He has chronic venous stasis changes involving both lower legs and feet, but otherwise is neurovascularly intact to his right foot.  X-rays:   Recent x-rays of his right knee are available for review.  These films demonstrate perhaps mild degenerative changes as manifested by some mild narrowing of the medial compartment, but no fractures, lytic lesions, or other acute bony abnormalities are identified.  Assessment: Right knee sprain with probable gouty flare.  Plan: The treatment options have been discussed with the patient as well as with his son who is at the bedside.  Based on the patient's examination and laboratory data, I do not feel the patient has a septic knee, nor do I feel that the patient has a major traumatic injury to the knee.  The patient is offered and accepts a right knee aspiration and steroid injection.  After obtaining verbal consent, the right knee is aspirated sterilely of approximately 20 cc of slightly turbid yellowish fluid.  This fluid was sent off for cell count and differential, culture and sensitivity, Gram stain, and crystals.  The knee is then injected sterilely with a solution of 2 cc of Kenalog 40 (80 mg) and 8 cc of 0.5% Sensorcaine.  The patient tolerated the procedure well.  The patient may be mobilized with physical therapy as symptoms permit.  He may have ice applied to the knee as needed, and take appropriate pain medication as necessary.  Thank you for asked me to participate in the care of this most pleasant man.  I will be happy to follow him with you.   Pascal Lux, MD  Beeper #:  [phone]  08/29/2017 8:15 PM

## 2017-08-29 NOTE — ED Provider Notes (Signed)
[facility] Emergency Department Provider Note       Time seen: ----------------------------------------- 7:00 AM on 08/29/2017 -----------------------------------------   I have reviewed the triage vital signs and the nursing notes.  HISTORY   Chief Complaint Fall and Weakness    HPI Adam Merritt is a 72 y.o. male with a history of anemia, coronary artery disease, chest pain, diabetes, hyperlipidemia, hypertension and neuropathy who presents to the ED for weakness in the right leg.  Patient has also had increase of falls recently.  EMS reports he been to the residence multiple times recently for falls.  EMS helped the patient up but he refused to go to the bed.  He is complaining of increased right knee pain over the past week and left ankle pain for the last month.  He denies fevers, chills or other complaints.  Past Medical History:  Diagnosis Date  . Anemia   . CAD (coronary artery disease)   . Chest pain   . Coronary artery disease   . Diabetes mellitus without complication ([place])   . Esophageal reflux   . Herpes zoster without mention of complication   . Hyperlipidemia   . Hypertension   . Insomnia   . Lumbago   . Neuropathy in diabetes ([place])   . Obstructive chronic bronchitis without exacerbation ([place])   . Other malaise and fatigue   . Stroke ([place])   . Varicose veins     Patient Active Problem List   Diagnosis Date Noted  . Other malaise and fatigue 07/09/2017  . CVA (cerebral vascular accident) ([place]) 09/15/2016  . 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 CVA (cerebral infarction) 02/12/2015  . Chronic diastolic CHF (congestive heart failure), NYHA class 3 ([place]) 11/30/2014  . Benign essential hypertension 11/06/2014  . CAD (coronary artery disease) of artery bypass graft 01/19/2014  . CKD (chronic kidney disease), stage III ([place]) 01/19/2014  . COPD (chronic obstructive pulmonary disease) ([place]) 01/19/2014  . Diabetic neuropathy ([place])  01/19/2014  . Insulin dependent diabetes mellitus ([place]) 01/19/2014  . OSA (obstructive sleep apnea) 01/19/2014  . Mixed hyperlipidemia 10/29/2013  . Peripheral vascular disease, unspecified ([place]) 09/29/2013    Past Surgical History:  Procedure Laterality Date  . CHOLECYSTECTOMY    . CORONARY ARTERY BYPASS GRAFT      Allergies Patient has no known allergies.  Social History Social History   Tobacco Use  . Smoking status: Never Smoker  . Smokeless tobacco: Never Used  Substance Use Topics  . Alcohol use: No  . Drug use: No   Review of Systems Constitutional: Negative for fever. Cardiovascular: Negative for chest pain. Respiratory: Negative for shortness of breath. Gastrointestinal: Negative for abdominal pain, vomiting and diarrhea. Musculoskeletal: Positive for right knee pain and ankle pain Skin: Negative for rash. Neurological: Negative for headaches, positive for right leg weakness  All systems negative/normal/unremarkable except as stated in the HPI  ____________________________________________   PHYSICAL EXAM:  VITAL SIGNS: ED Triage Vitals  Enc Vitals Group     BP 08/29/17 0657 (!) 150/71     Pulse Rate 08/29/17 0657 87     Resp 08/29/17 0657 20     Temp 08/29/17 0657 98 F (36.7 C)     Temp Source 08/29/17 0657 Oral     SpO2 08/29/17 0651 98 %     Weight 08/29/17 0659 290 lb (131.5 kg)     Height 08/29/17 0659 5\' 6"  (1.676 m)     Head Circumference --  Peak Flow --      Pain Score 08/29/17 0658 10     Pain Loc --      Pain Edu? --      Excl. in [place]? --    Constitutional: Alert and oriented.  Chronically ill-appearing, no distress Eyes: Conjunctivae are normal. Normal extraocular movements. ENT   Head: Normocephalic and atraumatic.   Nose: No congestion/rhinnorhea.   Mouth/Throat: Mucous membranes are moist.   Neck: No stridor. Cardiovascular: Normal rate, regular rhythm. No murmurs, rubs, or gallops. Respiratory: Normal  respiratory effort without tachypnea nor retractions. Breath sounds are clear and equal bilaterally. No wheezes/rales/rhonchi. Gastrointestinal: Soft and nontender. Normal bowel sounds Musculoskeletal:  Bilateral pitting edema that appears chronic, limited range of motion, tenderness over the right knee and left ankle Neurologic:  Normal speech and language. No gross focal neurologic deficits are appreciated.  Generalized weakness, nothing focal  Skin:  Skin is warm, dry and intact. No rash noted. Psychiatric: Mood and affect are normal. Speech and behavior are normal.  ____________________________________________  EKG: Interpreted by me.  Sinus rhythm the rate 88 bpm, normal PR interval, normal QRS size, borderline long QT.  Flat T waves.  ____________________________________________  ED COURSE:  As part of my medical decision making, I reviewed the following data within the [place] History obtained from family if available, nursing notes, old chart and ekg, as well as notes from prior ED visits. Patient presented for weakness and frequent falls, we will assess with labs and imaging as indicated at this time.   Procedures ____________________________________________   LABS (pertinent positives/negatives)  Labs Reviewed  URINALYSIS, COMPLETE (UACMP) WITH MICROSCOPIC - Abnormal; Notable for the following components:      Result Value   Color, Urine YELLOW (*)    APPearance CLEAR (*)    Hgb urine dipstick SMALL (*)    Squamous Epithelial / LPF 0-5 (*)    All other components within normal limits  COMPREHENSIVE METABOLIC PANEL - Abnormal; Notable for the following components:   Sodium 134 (*)    Potassium 2.9 (*)    Chloride 91 (*)    Glucose, Bld 168 (*)    BUN 44 (*)    Creatinine, Ser 2.43 (*)    Calcium 8.6 (*)    AST 48 (*)    GFR calc non Af Amer 25 (*)    GFR calc Af Amer 29 (*)    All other components within normal limits  CBC WITH  DIFFERENTIAL/PLATELET - Abnormal; Notable for the following components:   WBC 14.0 (*)    RBC 4.14 (*)    HCT 38.8 (*)    Neutro Abs 11.6 (*)    All other components within normal limits  URINE CULTURE  TROPONIN I  BRAIN NATRIURETIC PEPTIDE    RADIOLOGY Images were viewed by me  Right knee x-ray, left ankle x-ray Not reveal any acute process ____________________________________________  DIFFERENTIAL DIAGNOSIS   General debility, dehydration, electrolyte abnormality, fracture, contusion  FINAL ASSESSMENT AND PLAN  Weakness, falls, acute renal failure, UTI   Plan: The patient had presented for weakness with recent falls. Patient's labs revealed acute on chronic renal failure with mild hypokalemia and some leukocytosis likely from underlying UTI. Patient's imaging did not reveal any acute process.  He was given IV fluids as well as IV Rocephin.  He will need a PT consult, I will discussed with the hospitalist for admission.   Laurence Aly, MD   Note: This note  was generated in part or whole with voice recognition software. Voice recognition is usually quite accurate but there are transcription errors that can and very often do occur. I apologize for any typographical errors that were not detected and corrected.     Earleen Newport, MD 08/29/17 [phone]

## 2017-08-29 NOTE — ED Notes (Signed)
Dr salary at bedside

## 2017-08-29 NOTE — ED Notes (Signed)
Pt presents via ems after multiple recent falls. This morning he "slid to the floor" from his bed, crawled to the LR and had his wife call ems. Pt states he can walk "a tiny bit" with his walker, but that he increasingly falls when trying to walk. Pt alert & oriented with nad noted.

## 2017-08-29 NOTE — H&P (Signed)
[facility] NAME: Adam Merritt    MR#:  [id number]  DATE OF BIRTH:  May 25, 1946  DATE OF ADMISSION:  08/29/2017  PRIMARY CARE PHYSICIAN: [name], MD   REQUESTING/REFERRING PHYSICIAN:   CHIEF COMPLAINT:   Chief Complaint  Patient presents with  . Fall  . Weakness    HISTORY OF PRESENT ILLNESS: Adam Merritt  is a 72 y.o. male with a known history per below which also includes chronic pain syndrome, chronic hypoxic respiratory failure-on 2 L at night-patient noncompliant with use over the last 1 week, unable to use CPAP due to claustrophobia, known history of frequent falls, patient has been seen by EMS numerous times over the last month-patient always refused to come to the emergency room for evaluation, patient stated that he fell 3 weeks ago down the stairs resulting in right knee pain/left ankle pain, right knee pain is 10 out of 10 currently, left ankle pain is 3 out of 10 at rest, patient brought to the emergency room via EMS for recurrent fall, unable to get off the floor, ER workup noted for potassium 2.9, sodium 134, chloride 91, creatinine 2.4 with baseline normal, white count 14,000, UA negative, left ankle x-rays noted for soft tissue swelling, right knee films negative for any acute fracture, chest x-ray noted for left atelectasis with pleural effusion, hospitalist call for further evaluation/care, patient evaluated emergency room, wife at the bedside, patient is now been admitted for acute kidney injury with chronic kidney disease, acute hypokalemia, acute right knee/ankle pain, and acute immobility syndrome.  PAST MEDICAL HISTORY:   Past Medical History:  Diagnosis Date  . Anemia   . CAD (coronary artery disease)   . Chest pain   . Coronary artery disease   . Diabetes mellitus without complication ([place])   . Esophageal reflux   . Herpes zoster without mention of complication   . Hyperlipidemia   . Hypertension   .  Insomnia   . Lumbago   . Neuropathy in diabetes ([place])   . Obstructive chronic bronchitis without exacerbation ([place])   . Other malaise and fatigue   . Stroke ([place])   . Varicose veins     PAST SURGICAL HISTORY:  Past Surgical History:  Procedure Laterality Date  . CHOLECYSTECTOMY    . CORONARY ARTERY BYPASS GRAFT      SOCIAL HISTORY:  Social History   Tobacco Use  . Smoking status: Never Smoker  . Smokeless tobacco: Never Used  Substance Use Topics  . Alcohol use: No    FAMILY HISTORY:  Family History  Problem Relation Age of Onset  . Diabetes Mother   . Hyperlipidemia Mother   . Hypertension Mother   . Diabetes Father   . Hyperlipidemia Father   . Hypertension Father     DRUG ALLERGIES: NKDA  REVIEW OF SYSTEMS:   CONSTITUTIONAL: No fever, fatigue or weakness.  EYES: No blurred or double vision.  EARS, NOSE, AND THROAT: No tinnitus or ear pain.  RESPIRATORY: No cough, shortness of breath, wheezing or hemoptysis.  CARDIOVASCULAR: No chest pain, orthopnea, edema.  GASTROINTESTINAL: No nausea, vomiting, diarrhea or abdominal pain.  GENITOURINARY: No dysuria, hematuria.  ENDOCRINE: No polyuria, nocturia,  HEMATOLOGY: No anemia, easy bruising or bleeding SKIN: No rash or lesion. MUSCULOSKELETAL: Right knee, left ankle pain    NEUROLOGIC: No tingling, numbness, weakness.  PSYCHIATRY: No anxiety or depression.   MEDICATIONS AT HOME:  Prior to Admission medications  Medication Sig Start Date End Date Taking? Authorizing Provider  albuterol (PROVENTIL HFA;VENTOLIN HFA) 108 (90 BASE) MCG/ACT inhaler Inhale 1 puff into the lungs every 6 (six) hours as needed for wheezing or shortness of breath.   Yes [provider]  aspirin 325 MG EC tablet Take 325 mg by mouth daily.   Yes [provider]  atorvastatin (LIPITOR) 40 MG tablet Take 40 mg by mouth at bedtime.    Yes [provider]  carvedilol (COREG) 25 MG tablet Take 25 mg by mouth 2 (two)  times daily with a meal.   Yes [provider]  clobetasol ointment (TEMOVATE) 0.05 % Apply to all affected areas on lower legs and feet bid 08/13/17  Yes Boscia, Heather E, NP  fentaNYL (DURAGESIC - DOSED MCG/HR) 50 MCG/HR Place 1 patch (50 mcg total) onto the skin every 3 (three) days. 08/13/17  Yes Boscia, Greer Ee, NP  furosemide (LASIX) 40 MG tablet Take 1 tablet (40 mg total) by mouth daily. 07/26/17  Yes Boscia, Greer Ee, NP  gabapentin (NEURONTIN) 400 MG capsule Take 400 mg by mouth 3 (three) times daily.    Yes [provider]  hydrALAZINE (APRESOLINE) 25 MG tablet Take 25 mg by mouth 3 (three) times daily.   Yes [provider]  ibuprofen (ADVIL,MOTRIN) 600 MG tablet Take 600 mg by mouth 2 (two) times daily as needed.   Yes [provider]  Insulin Glargine (BASAGLAR KWIKPEN) 100 UNIT/ML SOPN Inject 30 Units into the skin at bedtime.   Yes [provider]  ipratropium-albuterol (DUONEB) 0.5-2.5 (3) MG/3ML SOLN Take 3 mLs by nebulization every 4 (four) hours as needed (for shortness of breath).   Yes [provider]  Liraglutide (VICTOZA) 18 MG/3ML SOPN Inject 1.8 mg into the skin daily.    Yes [provider]  metolazone (ZAROXOLYN) 2.5 MG tablet Take 2.5 mg by mouth daily.   Yes [provider]  Multiple Vitamin (MULTIVITAMIN WITH MINERALS) TABS tablet Take 1 tablet by mouth daily.   Yes [provider]  omeprazole (PRILOSEC) 20 MG capsule Take 20 mg by mouth daily.   Yes [provider]  ondansetron (ZOFRAN) 8 MG tablet Take 1 tablet (8 mg total) by mouth every 8 (eight) hours as needed for nausea or vomiting. 07/09/17  Yes Boscia, Greer Ee, NP  Oxycodone HCl 10 MG TABS Take 1 tablet (10 mg total) by mouth 3 (three) times daily as needed. 08/13/17  Yes Boscia, Greer Ee, NP  pentoxifylline (TRENTAL) 400 MG CR tablet Take 400 mg by mouth 3 (three) times daily.   Yes [provider]  potassium  chloride SA (K-DUR,KLOR-CON) 20 MEQ tablet Take 20 mEq by mouth 2 (two) times daily.   Yes [provider]  sennosides-docusate sodium (SENOKOT-S) 8.6-50 MG tablet Take 1-2 tablets by mouth 2 (two) times daily.   Yes [provider]  zolpidem (AMBIEN CR) 12.5 MG CR tablet Take 1 tablet (12.5 mg total) by mouth at bedtime as needed for sleep. 06/29/17  Yes Boscia, Heather E, NP      PHYSICAL EXAMINATION:   VITAL SIGNS: Blood pressure (!) 150/71, pulse 87, temperature 98 F (36.7 C), temperature source Oral, resp. rate 20, height 5\' 6"  (1.676 m), weight 131.5 kg (290 lb), SpO2 94 %.  GENERAL:  72 y.o.-year-old patient lying in the bed with no acute distress.  Extreme morbid obesity EYES: Pupils equal, round, reactive to light and accommodation. No scleral icterus. Extraocular muscles  intact.  HEENT: Head atraumatic, normocephalic. Oropharynx and nasopharynx clear.  NECK:  Supple, no jugular venous distention. No thyroid enlargement, no tenderness.  LUNGS: Normal breath sounds bilaterally, no wheezing, rales,rhonchi or crepitation. No use of accessory muscles of respiration.  CARDIOVASCULAR: S1, S2 normal. No murmurs, rubs, or gallops.  ABDOMEN: Soft, nontender, nondistended. Bowel sounds present. No organomegaly or mass.  EXTREMITIES: Chronic lymphedema, no cyanosis, or clubbing.  NEUROLOGIC: Cranial nerves II through XII are intact. MAEAS -decreased range of motion right knee/left ankle Gait not checked.  PSYCHIATRIC: The patient is alert and oriented x 3.  SKIN: No obvious rash, lesion, or ulcer.   LABORATORY PANEL:   CBC Recent Labs  Lab 08/29/17 0704  WBC 14.0*  HGB 13.3  HCT 38.8*  PLT 184  MCV 93.7  MCH 32.2  MCHC 34.3  RDW 13.5  LYMPHSABS 1.1  MONOABS 1.0  EOSABS 0.2  BASOSABS 0.0   ------------------------------------------------------------------------------------------------------------------  Chemistries  Recent Labs  Lab 08/29/17 0704  NA  134*  K 2.9*  CL 91*  CO2 28  GLUCOSE 168*  BUN 44*  CREATININE 2.43*  CALCIUM 8.6*  AST 48*  ALT 26  ALKPHOS 71  BILITOT 1.1   ------------------------------------------------------------------------------------------------------------------ estimated creatinine clearance is 34.8 mL/min (A) (by C-G formula based on SCr of 2.43 mg/dL (H)). ------------------------------------------------------------------------------------------------------------------ No results for input(s): TSH, T4TOTAL, T3FREE, THYROIDAB in the last 72 hours.  Invalid input(s): FREET3   Coagulation profile No results for input(s): INR, PROTIME in the last 168 hours. ------------------------------------------------------------------------------------------------------------------- No results for input(s): DDIMER in the last 72 hours. -------------------------------------------------------------------------------------------------------------------  Cardiac Enzymes Recent Labs  Lab 08/29/17 0704  TROPONINI <0.03   ------------------------------------------------------------------------------------------------------------------ Invalid input(s): POCBNP  ---------------------------------------------------------------------------------------------------------------  Urinalysis    Component Value Date/Time   COLORURINE YELLOW (A) 08/29/2017 0704   APPEARANCEUR CLEAR (A) 08/29/2017 0704   APPEARANCEUR Clear 06/29/2013 1514   LABSPEC 1.012 08/29/2017 0704   LABSPEC 1.010 06/29/2013 1514   PHURINE 6.0 08/29/2017 0704   GLUCOSEU NEGATIVE 08/29/2017 0704   GLUCOSEU Negative 06/29/2013 1514   HGBUR SMALL (A) 08/29/2017 0704   BILIRUBINUR NEGATIVE 08/29/2017 0704   BILIRUBINUR Negative 06/29/2013 1514   KETONESUR NEGATIVE 08/29/2017 0704   PROTEINUR NEGATIVE 08/29/2017 0704   NITRITE NEGATIVE 08/29/2017 0704   LEUKOCYTESUR NEGATIVE 08/29/2017 0704   LEUKOCYTESUR Negative 06/29/2013 1514      RADIOLOGY: Dg Ankle Complete Left  Result Date: 08/29/2017 CLINICAL DATA:  Pain following fall EXAM: LEFT ANKLE COMPLETE - 3+ VIEW COMPARISON:  None. FINDINGS: Frontal, oblique, and lateral views were obtained. There is generalized soft tissue swelling. No fracture or joint effusion. No appreciable joint space narrowing or erosion. Ankle mortise appears intact. There are spurs arising from the posterior and inferior calcaneus. IMPRESSION: Calcaneal spurs. Diffuse soft tissue swelling. No fracture. Ankle mortise appears intact. Electronically Signed   By: Lowella Grip III M.D.   On: 08/29/2017 08:20   Dg Chest Port 1 View  Result Date: 08/29/2017 CLINICAL DATA:  Several recent falls.  Hypertension. EXAM: PORTABLE CHEST 1 VIEW COMPARISON:  Chest radiograph August 30, 2016 and chest CT September 15, 2016 FINDINGS: There is mild atelectatic change in the left base with small left pleural effusion. Lungs elsewhere are clear. Heart is mildly enlarged with pulmonary vascularity normal. Patient is status post coronary artery bypass grafting. No adenopathy. No bone lesions. IMPRESSION: Stable cardiac prominence. Mild left base atelectasis with small left pleural effusion. Lungs elsewhere clear. Electronically Signed   By: Lowella Grip III M.D.  On: 08/29/2017 07:27   Dg Knee Complete 4 Views Right  Result Date: 08/29/2017 CLINICAL DATA:  Pain following fall EXAM: RIGHT KNEE - COMPLETE 4+ VIEW COMPARISON:  None. FINDINGS: Frontal, lateral, and bilateral oblique views were obtained. No acute fracture or dislocation. There is a small joint effusion. There is mild narrowing medially and in the patellofemoral joint regions. No erosive change. IMPRESSION: Small joint effusion. No acute fracture or dislocation. Joint space narrowing medially and in the patellofemoral joint regions. Electronically Signed   By: Lowella Grip III M.D.   On: 08/29/2017 08:20    EKG: Orders placed or performed during the  hospital encounter of 08/29/17  . ED EKG  . ED EKG  . EKG 12-Lead  . EKG 12-Lead    IMPRESSION AND PLAN: 1 acute kidney injury with chronic kidney disease III Most likely secondary to multifactorial process which includes medication side effect from Lasix/Zaroxolyn, NSAID use Admit to regular nursing floor bed, gentle IV fluids for rehydration, avoid nephrotoxic agents, check renal ultrasound, nephrology to see, strict I&O monitoring, daily weights, hold diuretics for now, BMP in the morning  2 acute hypokalemia, hyponatremia, hypochloremia Secondary to diuretic use Replete with p.o. potassium, IV fluids, check magnesium level, BMP in the morning  3 acute right knee pain/left ankle pain Most likely multifactorial in etiology-secondary to recent trauma/falling events, most likely compounded by poly-articular gouty arthritis which can be exacerbated with trauma Check uric acid level, continue home chronic pain measures, IV morphine as needed severe pain, add colchicine twice daily for now, consult orthopedic surgery given severe right knee pain-?  Need for injection, and continue close medical monitoring Left ankle x-rays noted for soft tissue swelling, right knee x-ray negative for acute fracture  4 chronic extreme morbid obesity Most likely secondary to excess calories Lifestyle modification recommended  5 chronic hypoxic respiratory failure Noncompliant with O2 requirement at night-the patient and the patient's wife does not know how much oxygen he is supposed to be on at night, has been noncompliant with its use for the last 1 week  6 chronic obstructive sleep apnea Noncompliant with CPAP due to claustrophobia Continue supplemental oxygen as needed  7 chronic diabetes mellitus type 2 Stable Hold Victoza continue long-acting insulin, sliding scale insulin with Accu-Cheks per routine, check hemoglobin A1c determine level of control  8 history of CVA Aspiration/fall  precautions Physical therapy to evaluate/treat  9 acute leukocytosis Etiology unknown UA unimpressive, chest x-ray noted for left atelectasis with pleural effusion Repeat CBC in the morning, avoid antibiotics at this time  10 chronic GERD without esophagitis PPI daily   All the records are reviewed and case discussed with ED provider. Management plans discussed with the patient, family and they are in agreement.  CODE STATUS:full Code Status History    Date Active Date Inactive Code Status Order ID Comments User Context   09/15/2016 2233 09/16/2016 1942 Full Code [id number]  [name], DO Inpatient   02/12/2015 2156 02/13/2015 1659 Full Code [id number]  Henreitta Leber, MD Inpatient       TOTAL TIME TAKING CARE OF THIS PATIENT: 45 minutes.    Avel Peace Shermaine Rivet M.D on 08/29/2017   Between 7am to 6pm - Pager - [phone]  After 6pm go to [email or web address] - password [email or web address] [facility]  Office  [phone]  CC: Primary care physician; [name], MD   Note: This dictation was prepared with Dragon dictation along with smaller phrase technology. Any transcriptional errors  that result from this process are unintentional.

## 2017-08-30 DIAGNOSIS — N3000 Acute cystitis without hematuria: Secondary | ICD-10-CM | POA: Diagnosis not present

## 2017-08-30 DIAGNOSIS — S8391XA Sprain of unspecified site of right knee, initial encounter: Secondary | ICD-10-CM | POA: Diagnosis not present

## 2017-08-30 LAB — BASIC METABOLIC PANEL
ANION GAP: 12 (ref 5–15)
BUN: 32 mg/dL — ABNORMAL HIGH (ref 6–20)
CHLORIDE: 100 mmol/L — AB (ref 101–111)
CO2: 24 mmol/L (ref 22–32)
Calcium: 8.5 mg/dL — ABNORMAL LOW (ref 8.9–10.3)
Creatinine, Ser: 1.59 mg/dL — ABNORMAL HIGH (ref 0.61–1.24)
GFR calc non Af Amer: 41 mL/min — ABNORMAL LOW (ref >60)
GFR, EST AFRICAN AMERICAN: 48 mL/min — AB (ref >60)
Glucose, Bld: 283 mg/dL — ABNORMAL HIGH (ref 65–99)
POTASSIUM: 4 mmol/L (ref 3.5–5.1)
SODIUM: 136 mmol/L (ref 135–145)

## 2017-08-30 LAB — CBC
HCT: 40.6 % (ref 40.0–52.0)
Hemoglobin: 13.4 g/dL (ref 13.0–18.0)
MCH: 31.7 pg (ref 26.0–34.0)
MCHC: 33.1 g/dL (ref 32.0–36.0)
MCV: 95.6 fL (ref 80.0–100.0)
Platelets: 181 10*3/uL (ref 150–440)
RBC: 4.24 MIL/uL — AB (ref 4.40–5.90)
RDW: 13.4 % (ref 11.5–14.5)
WBC: 6.3 10*3/uL (ref 3.8–10.6)

## 2017-08-30 LAB — GLUCOSE, CAPILLARY
GLUCOSE-CAPILLARY: 349 mg/dL — AB (ref 65–99)
GLUCOSE-CAPILLARY: 233 mg/dL — AB (ref 65–99)

## 2017-08-30 LAB — URINE CULTURE
CULTURE: NO GROWTH
SPECIAL REQUESTS: NORMAL

## 2017-08-30 MED ORDER — INSULIN ASPART 100 UNIT/ML ~~LOC~~ SOLN
0.0000 [IU] | Freq: Three times a day (TID) | SUBCUTANEOUS | Status: DC
  Administered 2017-08-30: 7 [IU] via SUBCUTANEOUS
  Administered 2017-08-31 (×2): 3 [IU] via SUBCUTANEOUS
  Filled 2017-08-30 (×3): qty 1

## 2017-08-30 MED ORDER — INSULIN ASPART 100 UNIT/ML ~~LOC~~ SOLN
0.0000 [IU] | Freq: Every day | SUBCUTANEOUS | Status: DC
  Administered 2017-08-30: 2 [IU] via SUBCUTANEOUS
  Filled 2017-08-30: qty 1

## 2017-08-30 MED ORDER — SODIUM CHLORIDE 0.9 % IV SOLN
INTRAVENOUS | Status: DC
  Administered 2017-08-30 (×2): via INTRAVENOUS

## 2017-08-30 MED ORDER — PREDNISONE 20 MG PO TABS
40.0000 mg | ORAL_TABLET | Freq: Every day | ORAL | Status: DC
  Administered 2017-08-31: 40 mg via ORAL
  Filled 2017-08-30: qty 2

## 2017-08-30 MED ORDER — POTASSIUM CHLORIDE IN NACL 20-0.9 MEQ/L-% IV SOLN
INTRAVENOUS | Status: DC
  Filled 2017-08-30 (×2): qty 1000

## 2017-08-30 NOTE — Consult Note (Signed)
[facility] CONSULT NOTE    Date: 08/30/2017                  Patient Name:  Adam Merritt  MRN: [id number]  DOB: 1945/06/25  Age / Sex: 72 y.o., male         PCP: [name], MD                 Service Requesting Consult: Hospitalist                 Reason for Consult: Acute renal failure            History of Present Illness: Patient is a 72 y.o. male with a PMHx of coronary artery disease status post CABG, diabetes mellitus type 2, GERD, hyperlipidemia, hypertension, peripheral neuropathy, history of CVA, who was admitted to [facility] on 08/29/2017 for evaluation of recent fall.  Patient expressed a fall approximately 3 weeks ago.  He then evaluated by EMS multiple times over the past month but declined emergency room evaluation.  He was brought here yesterday and found to have a upon initial evaluation creatinine was 0.4.  He was also hyperkalemic with a potassium of 2.9.  He is noted on multiple diuretics.  He had rather poor p.o. intake over the past several weeks.  He was taking oxycodone for pain.  However he denies taking NSAIDs otherwise.  Renal ultrasound was performed yesterday and was negative for hydronephrosis.  Patient currently maintained on IV fluid hydration with 0.9 normal saline.   Medications: Outpatient medications: Medications Prior to Admission  Medication Sig Dispense Refill Last Dose  . albuterol (PROVENTIL HFA;VENTOLIN HFA) 108 (90 BASE) MCG/ACT inhaler Inhale 1 puff into the lungs every 6 (six) hours as needed for wheezing or shortness of breath.   PRN at PRN  . aspirin 325 MG EC tablet Take 325 mg by mouth daily.   08/28/2017 at AM  . atorvastatin (LIPITOR) 40 MG tablet Take 40 mg by mouth at bedtime.    08/28/2017 at PM  . carvedilol (COREG) 25 MG tablet Take 25 mg by mouth 2 (two) times daily with a meal.   08/28/2017 at PM  . clobetasol ointment (TEMOVATE) 0.05 % Apply to all affected areas on lower legs and feet bid 60 g 3 UTD at UTD   . fentaNYL (DURAGESIC - DOSED MCG/HR) 50 MCG/HR Place 1 patch (50 mcg total) onto the skin every 3 (three) days. 10 patch 0 UTD at UTD  . furosemide (LASIX) 40 MG tablet Take 1 tablet (40 mg total) by mouth daily. 30 tablet 1 08/28/2017 at AM  . gabapentin (NEURONTIN) 400 MG capsule Take 400 mg by mouth 3 (three) times daily.    08/28/2017 at PM  . hydrALAZINE (APRESOLINE) 25 MG tablet Take 25 mg by mouth 3 (three) times daily.   08/28/2017 at PM  . ibuprofen (ADVIL,MOTRIN) 600 MG tablet Take 600 mg by mouth 2 (two) times daily as needed.   PRN at PRN  . Insulin Glargine (BASAGLAR KWIKPEN) 100 UNIT/ML SOPN Inject 30 Units into the skin at bedtime.   08/28/2017 at PM  . ipratropium-albuterol (DUONEB) 0.5-2.5 (3) MG/3ML SOLN Take 3 mLs by nebulization every 4 (four) hours as needed (for shortness of breath).   PRN at PRN  . Liraglutide (VICTOZA) 18 MG/3ML SOPN Inject 1.8 mg into the skin daily.    08/28/2017 at AM  . metolazone (ZAROXOLYN) 2.5 MG tablet Take 2.5 mg by  mouth daily.   08/28/2017 at AM  . Multiple Vitamin (MULTIVITAMIN WITH MINERALS) TABS tablet Take 1 tablet by mouth daily.   08/28/2017 at AM  . omeprazole (PRILOSEC) 20 MG capsule Take 20 mg by mouth daily.   08/28/2017 at AM  . ondansetron (ZOFRAN) 8 MG tablet Take 1 tablet (8 mg total) by mouth every 8 (eight) hours as needed for nausea or vomiting. 30 tablet 1 PRN at PRN  . Oxycodone HCl 10 MG TABS Take 1 tablet (10 mg total) by mouth 3 (three) times daily as needed. 90 tablet 0 PRN at PRN  . pentoxifylline (TRENTAL) 400 MG CR tablet Take 400 mg by mouth 3 (three) times daily.   08/28/2017 at PM  . potassium chloride SA (K-DUR,KLOR-CON) 20 MEQ tablet Take 20 mEq by mouth 2 (two) times daily.   08/28/2017 at PM  . sennosides-docusate sodium (SENOKOT-S) 8.6-50 MG tablet Take 1-2 tablets by mouth 2 (two) times daily.   08/28/2017 at AM  . zolpidem (AMBIEN CR) 12.5 MG CR tablet Take 1 tablet (12.5 mg total) by mouth at bedtime as needed for sleep.  30 tablet 3 PRN    Current medications: Current Facility-Administered Medications  Medication Dose Route Frequency Provider Last Rate Last Dose  . acetaminophen (TYLENOL) tablet 650 mg  650 mg Oral Q6H PRN Salary, Montell D, MD       Or  . acetaminophen (TYLENOL) suppository 650 mg  650 mg Rectal Q6H PRN Salary, Montell D, MD      . albuterol (PROVENTIL) (2.5 MG/3ML) 0.083% nebulizer solution 2.5 mg  2.5 mg Nebulization Q6H PRN Salary, Montell D, MD      . aspirin EC tablet 325 mg  325 mg Oral Daily Salary, Montell D, MD   325 mg at 08/29/17 1336  . atorvastatin (LIPITOR) tablet 40 mg  40 mg Oral QHS Salary, Holly Bodily D, MD   40 mg at 08/29/17 2208  . bisacodyl (DULCOLAX) suppository 10 mg  10 mg Rectal Daily PRN Salary, Montell D, MD      . bupivacaine (MARCAINE) 0.5 % injection 10 mL  10 mL Intra-articular Once Poggi, Marshall Cork, MD      . carvedilol (COREG) tablet 25 mg  25 mg Oral BID WC Salary, Montell D, MD   25 mg at 08/29/17 1611  . clobetasol ointment (TEMOVATE) 0.05 %   Topical BID PRN Salary, Montell D, MD      . colchicine tablet 0.6 mg  0.6 mg Oral BID Salary, Montell D, MD   0.6 mg at 08/29/17 2207  . fentaNYL (DURAGESIC - dosed mcg/hr) 50 mcg  50 mcg Transdermal Q72H Salary, Montell D, MD   50 mcg at 08/29/17 1336  . gabapentin (NEURONTIN) capsule 400 mg  400 mg Oral TID Loney Hering D, MD   400 mg at 08/29/17 2208  . heparin injection 5,000 Units  5,000 Units Subcutaneous Q8H Salary, Montell D, MD   5,000 Units at 08/29/17 2210  . hydrALAZINE (APRESOLINE) tablet 25 mg  25 mg Oral TID Loney Hering D, MD   25 mg at 08/29/17 2208  . insulin glargine (LANTUS) injection 30 Units  30 Units Subcutaneous QHS Loney Hering D, MD   30 Units at 08/29/17 2209  . ipratropium-albuterol (DUONEB) 0.5-2.5 (3) MG/3ML nebulizer solution 3 mL  3 mL Nebulization Q4H PRN Salary, Montell D, MD      . morphine 4 MG/ML injection 4 mg  4 mg Intravenous Q2H PRN Salary, Montell D,  MD      .  multivitamin with minerals tablet 1 tablet  1 tablet Oral Daily Salary, Montell D, MD   1 tablet at 08/29/17 1336  . ondansetron (ZOFRAN) tablet 4 mg  4 mg Oral Q6H PRN Salary, Montell D, MD       Or  . ondansetron (ZOFRAN) injection 4 mg  4 mg Intravenous Q6H PRN Salary, Montell D, MD      . oxyCODONE (Oxy IR/ROXICODONE) immediate release tablet 10 mg  10 mg Oral TID PRN Salary, Holly Bodily D, MD   10 mg at 08/29/17 1600  . pantoprazole (PROTONIX) EC tablet 40 mg  40 mg Oral Daily Salary, Montell D, MD   40 mg at 08/29/17 1336  . pentoxifylline (TRENTAL) CR tablet 400 mg  400 mg Oral TID Loney Hering D, MD   400 mg at 08/29/17 2208  . polyethylene glycol (MIRALAX / GLYCOLAX) packet 17 g  17 g Oral Daily PRN Salary, Montell D, MD      . potassium chloride SA (K-DUR,KLOR-CON) CR tablet 20 mEq  20 mEq Oral BID Loney Hering D, MD   20 mEq at 08/29/17 2208  . senna-docusate (Senokot-S) tablet 1-2 tablet  1-2 tablet Oral BID Gorden Harms, MD   1 tablet at 08/29/17 2208  . sodium phosphate (FLEET) 7-19 GM/118ML enema 1 enema  1 enema Rectal Once Salary, Montell D, MD      . triamcinolone acetonide (KENALOG-40) injection 80 mg  80 mg Intra-articular Once Poggi, Marshall Cork, MD          Allergies: No Known Allergies    Past Medical History: Past Medical History:  Diagnosis Date  . Anemia   . CAD (coronary artery disease)   . Chest pain   . Coronary artery disease   . Diabetes mellitus without complication ([place])   . Esophageal reflux   . Herpes zoster without mention of complication   . Hyperlipidemia   . Hypertension   . Insomnia   . Lumbago   . Neuropathy in diabetes ([place])   . Obstructive chronic bronchitis without exacerbation ([place])   . Other malaise and fatigue   . Stroke ([place])   . Varicose veins      Past Surgical History: Past Surgical History:  Procedure Laterality Date  . CHOLECYSTECTOMY    . CORONARY ARTERY BYPASS GRAFT       Family History: Family History   Problem Relation Age of Onset  . Diabetes Mother   . Hyperlipidemia Mother   . Hypertension Mother   . Diabetes Father   . Hyperlipidemia Father   . Hypertension Father      Social History: Social History   Socioeconomic History  . Marital status: Married    Spouse name: Not on file  . Number of children: Not on file  . Years of education: Not on file  . Highest education level: Not on file  Occupational History  . Not on file  Social Needs  . Financial resource strain: Not on file  . Food insecurity:    Worry: Not on file    Inability: Not on file  . Transportation needs:    Medical: Not on file    Non-medical: Not on file  Tobacco Use  . Smoking status: Never Smoker  . Smokeless tobacco: Never Used  Substance and Sexual Activity  . Alcohol use: No  . Drug use: No  . Sexual activity: Not on file  Lifestyle  . Physical activity:  Days per week: Not on file    Minutes per session: Not on file  . Stress: Not on file  Relationships  . Social connections:    Talks on phone: Not on file    Gets together: Not on file    Attends religious service: Not on file    Active member of club or organization: Not on file    Attends meetings of clubs or organizations: Not on file    Relationship status: Not on file  . Intimate partner violence:    Fear of current or ex partner: Not on file    Emotionally abused: Not on file    Physically abused: Not on file    Forced sexual activity: Not on file  Other Topics Concern  . Not on file  Social History Narrative  . Not on file     Review of Systems: Review of Systems  Constitutional: Positive for malaise/fatigue. Negative for chills and fever.  HENT: Negative for hearing loss, nosebleeds and tinnitus.   Eyes: Negative for blurred vision and double vision.  Respiratory: Negative for cough, hemoptysis and sputum production.   Cardiovascular: Negative for chest pain, palpitations and orthopnea.  Gastrointestinal:  Negative for heartburn, nausea and vomiting.  Genitourinary: Negative for dysuria, frequency and urgency.  Musculoskeletal: Positive for falls.  Skin: Negative for itching and rash.  Neurological: Positive for weakness. Negative for dizziness.  Endo/Heme/Allergies: Negative for polydipsia. Does not bruise/bleed easily.  Psychiatric/Behavioral: Negative for depression and hallucinations.     Vital Signs: Blood pressure (!) 156/81, pulse 67, temperature 97.8 F (36.6 C), temperature source Oral, resp. rate 17, height 5\' 6"  (1.676 m), weight 131.5 kg (290 lb), SpO2 95 %.  Weight trends: Filed Weights   08/29/17 0659  Weight: 131.5 kg (290 lb)    Physical Exam: General: NAD, resting in bed  Head: Normocephalic, atraumatic.  Eyes: Anicteric, EOMI  Nose: Mucous membranes moist, not inflammed, nonerythematous.  Throat: Oropharynx nonerythematous, no exudate appreciated.   Neck: Supple, trachea midline.  Lungs:  Normal respiratory effort. Clear to auscultation BL without crackles or wheezes.  Heart: RRR. S1 and S2 normal without gallop, murmur, or rubs.  Abdomen:  BS normoactive. Soft, Nondistended, non-tender.  No masses or organomegaly.  Extremities: trace pretibial edema.  Neurologic: A&O X3, Motor strength is 5/5 in the all 4 extremities  Skin: No visible rashes, scars.    Lab results: Basic Metabolic Panel: Recent Labs  Lab 08/29/17 0704 08/29/17 1416  NA 134*  --   K 2.9*  --   CL 91*  --   CO2 28  --   GLUCOSE 168*  --   BUN 44*  --   CREATININE 2.43*  --   CALCIUM 8.6*  --   MG  --  2.1    Liver Function Tests: Recent Labs  Lab 08/29/17 0704  AST 48*  ALT 26  ALKPHOS 71  BILITOT 1.1  PROT 7.9  ALBUMIN 3.5   No results for input(s): LIPASE, AMYLASE in the last 168 hours. No results for input(s): AMMONIA in the last 168 hours.  CBC: Recent Labs  Lab 08/29/17 0704  WBC 14.0*  NEUTROABS 11.6*  HGB 13.3  HCT 38.8*  MCV 93.7  PLT 184    Cardiac  Enzymes: Recent Labs  Lab 08/29/17 0704  TROPONINI <0.03    BNP: Invalid input(s): POCBNP  CBG: Recent Labs  Lab 08/29/17 [place]    Microbiology: Results for orders placed or  performed during the hospital encounter of 08/29/17  Body fluid culture     Status: None (Preliminary result)   Collection Time: 08/29/17  8:00 PM  Result Value Ref Range Status   Specimen Description   Final    SYNOVIAL RIGHT KNEE Performed at [facility], [address], [place], [place] [address]    Special Requests   Final    NONE Performed at [facility], [address] [place], [place] [address]    Gram Stain   Final    FEW WBC PRESENT, PREDOMINANTLY PMN NO ORGANISMS SEEN Performed at [facility], [place] [address] [place], [place] [address]    Culture PENDING  Incomplete   Report Status PENDING  Incomplete    Coagulation Studies: No results for input(s): LABPROT, INR in the last 72 hours.  Urinalysis: Recent Labs    08/29/17 0704  COLORURINE YELLOW*  LABSPEC 1.012  PHURINE 6.0  GLUCOSEU NEGATIVE  HGBUR SMALL*  BILIRUBINUR NEGATIVE  KETONESUR NEGATIVE  PROTEINUR NEGATIVE  NITRITE NEGATIVE  LEUKOCYTESUR NEGATIVE      Imaging: Dg Ankle Complete Left  Result Date: 08/29/2017 CLINICAL DATA:  Pain following fall EXAM: LEFT ANKLE COMPLETE - 3+ VIEW COMPARISON:  None. FINDINGS: Frontal, oblique, and lateral views were obtained. There is generalized soft tissue swelling. No fracture or joint effusion. No appreciable joint space narrowing or erosion. Ankle mortise appears intact. There are spurs arising from the posterior and inferior calcaneus. IMPRESSION: Calcaneal spurs. Diffuse soft tissue swelling. No fracture. Ankle mortise appears intact. Electronically Signed   By: Lowella Grip III M.D.   On: 08/29/2017 08:20   US Renal  Result Date: 08/29/2017 CLINICAL DATA:  Acute renal failure. EXAM: RENAL / URINARY TRACT ULTRASOUND COMPLETE  COMPARISON:  None. FINDINGS: Right Kidney: Length: 10.7 cm. Echogenicity within normal limits. No mass or hydronephrosis visualized. Left Kidney: Length: 12.7 cm. Echogenicity within normal limits. No mass or hydronephrosis visualized. Bladder: Appears normal for degree of bladder distention. Postvoid residual of 128 cc. IMPRESSION: 1. No acute abnormality. Normal sonographic appearance of the bilateral kidneys. 2. Postvoid bladder residual of 128 cc. Electronically Signed   By: Titus Dubin M.D.   On: 08/29/2017 15:34   Dg Chest Port 1 View  Result Date: 08/29/2017 CLINICAL DATA:  Several recent falls.  Hypertension. EXAM: PORTABLE CHEST 1 VIEW COMPARISON:  Chest radiograph August 30, 2016 and chest CT September 15, 2016 FINDINGS: There is mild atelectatic change in the left base with small left pleural effusion. Lungs elsewhere are clear. Heart is mildly enlarged with pulmonary vascularity normal. Patient is status post coronary artery bypass grafting. No adenopathy. No bone lesions. IMPRESSION: Stable cardiac prominence. Mild left base atelectasis with small left pleural effusion. Lungs elsewhere clear. Electronically Signed   By: Lowella Grip III M.D.   On: 08/29/2017 07:27   Dg Knee Complete 4 Views Right  Result Date: 08/29/2017 CLINICAL DATA:  Pain following fall EXAM: RIGHT KNEE - COMPLETE 4+ VIEW COMPARISON:  None. FINDINGS: Frontal, lateral, and bilateral oblique views were obtained. No acute fracture or dislocation. There is a small joint effusion. There is mild narrowing medially and in the patellofemoral joint regions. No erosive change. IMPRESSION: Small joint effusion. No acute fracture or dislocation. Joint space narrowing medially and in the patellofemoral joint regions. Electronically Signed   By: Lowella Grip III M.D.   On: 08/29/2017 08:20      Assessment & Plan: Pt is a 72 y.o. male with  a PMHx of coronary artery disease status post CABG, diabetes mellitus type 2, GERD,  hyperlipidemia, hypertension, peripheral neuropathy, history of CVA, who was admitted to [facility] on 08/29/2017 for evaluation of recent fall.   1.  Acute renal failure.  The patient's baseline creatinine is 1.1.  Suspect acute renal failure is multifactorial with contributions from poor p.o. intake, diuretic usage.  Ibuprofen is on his medication list however patient states that he was not taking this.  Renal ultrasound was performed and was negative for hydronephrosis.  Continue patient on IV fluid hydration with 0.9 normal saline.  We will set rate at 75 cc/h.  We will plan to recheck serum electrolytes today.  Also check SPEP, UPEP, and ANA.  2.  Hypertension.  Maintain the patient on carvedilol as well as hydralazine.  3.  Lower extremity edema.  Hold metolazone as well as Lasix for now.

## 2017-08-30 NOTE — Care Management (Signed)
Patient presents with weakness. Has been falling at home and has right knee pain. Ortho aspirated knee with injection of steroids.Physical therapy ws contraindicated yesterday due to low potassium.

## 2017-08-30 NOTE — Evaluation (Signed)
Physical Therapy Evaluation Patient Details Name: Adam Merritt MRN: [id number] DOB: 1946-03-30 Today's Date: 08/30/2017   History of Present Illness  Pt is a 72 y/o M presenting to hospital 08/29/17 w/ weakness of R leg, recent falls, R knee pain 10/10 and L ankle pain 3/10. Pt admitted 08/29/17 w/ diagnosis of acute kidney injury, hypokalemia, acute knee/ankle pain, acute immobility. PMHx includes: anemia, coronary artery disease, chest pain, diabetes, hyperlipidemia, neuropathy, DM II, lumbago, chronic bronchitis, varicose veins, bells palsy (2016), and chronic hypoxic respiratory failure.    Clinical Impression  Pt demonstrated bed mobility (CGA), transfers, and ambulation (RW, CGA) (see mobility and ambulation for details). Pt ambulated RW CGA 80 ft and demonstrated safe use of RW, step-through pattern, and stated that he does not feel hardly any pain in the R knee (0-1/10) and that the R knee feels stronger. Pt demonstrated BLE weakness (see extremity assessment for details). Pt would benefit from skilled PT to progress strength and activity tolerance. Recommend HHPT upon discharge from acute hospitalization.    Follow Up Recommendations Home health PT    Equipment Recommendations  Rolling walker with 5" wheels;3in1 (PT)    Recommendations for Other Services       Precautions / Restrictions Precautions Precautions: Fall Precaution Comments: history of R knee buckling Restrictions Weight Bearing Restrictions: No      Mobility  Bed Mobility Overal bed mobility: Needs Assistance Bed Mobility: Supine to Sit     Supine to sit: Min guard;HOB elevated     General bed mobility comments: Pt required extra time and effort to move from supine (HOB elevated) to sit EOB; CGA provided but not required.  Transfers Overall transfer level: Needs assistance Equipment used: Rolling walker (2 wheeled) Transfers: Sit to/from Stand Sit to Stand: Min guard         General transfer  comment: Pt demonstrated sit to stand w RW CGA; vc's provided to push off of bed and then put hands on RW  Ambulation/Gait Ambulation/Gait assistance: Min guard Ambulation Distance (Feet): 80 Feet Assistive device: Rolling walker (2 wheeled) Gait Pattern/deviations: Step-through pattern Gait velocity: decreased per request of therapist (for safety) due to history of falls   General Gait Details: Pt ambulated w/ RW CGA;  Pt demonstrated safe use of RW and reported 0-1/10 pain in R knee. Pt reports that he RLE feels much better and does not feel like it is going to give out.  Stairs            Wheelchair Mobility    Modified Rankin (Stroke Patients Only)       Balance Overall balance assessment: Needs assistance Sitting-balance support: Feet supported;No upper extremity supported Sitting balance-Leahy Scale: Good Sitting balance - Comments: Pt demonstrated good sitting balance at EOB prior to standing.     Standing balance-Leahy Scale: Fair Standing balance comment: RW CGA for safety in standing, prior to ambulation                             Pertinent Vitals/Pain Pain Assessment: 0-10 Pain Score: 1  Pain Location: RLE Pain Descriptors / Indicators: Sore Pain Intervention(s): Limited activity within patient's tolerance;Monitored during session;Repositioned    Home Living Family/patient expects to be discharged to:: Private residence Living Arrangements: Spouse/significant other Available Help at Discharge: Family;Available 24 hours/day Type of Home: House Home Access: Ramped entrance     Home Layout: One level Home Equipment: Walker - 4 wheels;Cane - single  point      Prior Function Level of Independence: Independent with assistive device(s)         Comments: Pt reports prior to hospitalization he ambulated household and community distances w/ either a spc or 4WW.     Hand Dominance   Dominant Hand: Left    Extremity/Trunk Assessment    Upper Extremity Assessment Upper Extremity Assessment: RUE deficits/detail;LUE deficits/detail;Generalized weakness RUE Deficits / Details: BUE AROM WNL when raising arms overhead; Pt BUE strength 4+/5 w/ squeeze test LUE Deficits / Details: BUE AROM WNL when raising arms overhead; Pt BUE strength 4+/5 w/ squeeze test    Lower Extremity Assessment Lower Extremity Assessment: Generalized weakness;RLE deficits/detail;LLE deficits/detail RLE Deficits / Details: BLE AROM WNL and strength at least 3/5 when bringing knees to chest. LLE Deficits / Details: BLE AROM WNL and strength at least 3/5 when bringing knees to chest.    Cervical / Trunk Assessment Cervical / Trunk Assessment: Normal  Communication   Communication: No difficulties  Cognition Arousal/Alertness: Awake/alert Behavior During Therapy: WFL for tasks assessed/performed Overall Cognitive Status: Within Functional Limits for tasks assessed                                 General Comments: Pt A&O x 4      General Comments General comments (skin integrity, edema, etc.): RLE and LLE edema noted Pt agreeable to session.   Exercises     Assessment/Plan    PT Assessment Patient needs continued PT services  PT Problem List Decreased strength;Decreased activity tolerance;Decreased balance;Decreased mobility;Decreased knowledge of use of DME;Decreased safety awareness;Decreased knowledge of precautions;Decreased coordination       PT Treatment Interventions DME instruction;Gait training;Functional mobility training;Stair training;Therapeutic activities;Therapeutic exercise;Balance training;Patient/family education    PT Goals (Current goals can be found in the Care Plan section)  Acute Rehab PT Goals Patient Stated Goal: I want to get stronger. PT Goal Formulation: With patient Time For Goal Achievement: 09/13/17 Potential to Achieve Goals: Good    Frequency Min 2X/week   Barriers to discharge         Co-evaluation               AM-PAC PT "6 Clicks" Daily Activity  Outcome Measure Difficulty turning over in bed (including adjusting bedclothes, sheets and blankets)?: A Little Difficulty moving from lying on back to sitting on the side of the bed? : A Little Difficulty sitting down on and standing up from a chair with arms (e.g., wheelchair, bedside commode, etc,.)?: Unable Help needed moving to and from a bed to chair (including a wheelchair)?: A Little Help needed walking in hospital room?: A Little Help needed climbing 3-5 steps with a railing? : A Little 6 Click Score: 16    End of Session Equipment Utilized During Treatment: Gait belt Activity Tolerance: Patient tolerated treatment well Patient left: in chair;with call bell/phone within reach;with chair alarm set(B heels elevated by pillow) Nurse Communication: Mobility status PT Visit Diagnosis: Difficulty in walking, not elsewhere classified (R26.2);Other abnormalities of gait and mobility (R26.89);History of falling (Z91.81);Muscle weakness (generalized) (M62.81);Unsteadiness on feet (R26.81);Pain Pain - Right/Left: Right Pain - part of body: Knee    Time: 1440-1504 PT Time Calculation (min) (ACUTE ONLY): 24 min   Charges:         PT G Codes:         Trust Leh Mondrian-Pardue, SPT 08/30/2017, 3:30 PM

## 2017-08-30 NOTE — Progress Notes (Signed)
[place] at [facility]                                                                                                                                                                                  Patient Demographics   Adam Merritt, is a 72 y.o. male, DOB - 21-Feb-1946, FYB:[id number]  Admit date - 08/29/2017   Admitting Physician Gorden Harms, MD  Outpatient Primary MD for the patient is [name], MD   LOS - 1  Subjective: Pt admitted with fall and noted to have acute renal failure also knee pain    Review of Systems:   CONSTITUTIONAL: No documented fever. No fatigue, weakness. No weight gain, no weight loss.  EYES: No blurry or double vision.  ENT: No tinnitus. No postnasal drip. No redness of the oropharynx.  RESPIRATORY: No cough, no wheeze, no hemoptysis. No dyspnea.  CARDIOVASCULAR: No chest pain. No orthopnea. No palpitations. No syncope.  GASTROINTESTINAL: No nausea, no vomiting or diarrhea. No abdominal pain. No melena or hematochezia.  GENITOURINARY: No dysuria or hematuria.  ENDOCRINE: No polyuria or nocturia. No heat or cold intolerance.  HEMATOLOGY: No anemia. No bruising. No bleeding.  INTEGUMENTARY: No rashes. No lesions.  MUSCULOSKELETAL: Knee pain improved  nEUROLOGIC: No numbness, tingling, or ataxia. No seizure-type activity.  PSYCHIATRIC: No anxiety. No insomnia. No ADD.    Vitals:   Vitals:   08/29/17 1549 08/29/17 2003 08/30/17 0509 08/30/17 1246  BP: 125/69 124/70 (!) 156/81 136/76  Pulse: 77 77 67 74  Resp: 18 (!) 21 17 18   Temp:  98.6 F (37 C) 97.8 F (36.6 C) 97.7 F (36.5 C)  TempSrc:  Oral Oral Oral  SpO2: 97% 96% 95% 97%  Weight:      Height:        Wt Readings from Last 3 Encounters:  08/29/17 290 lb (131.5 kg)  08/13/17 290 lb (131.5 kg)  07/09/17 295 lb (133.8 kg)     Intake/Output Summary (Last 24 hours) at 08/30/2017 1332 Last data filed at 08/30/2017 1145 Gross per 24 hour  Intake 2537  ml  Output 1275 ml  Net 1262 ml    Physical Exam:   GENERAL: Pleasant-appearing in no apparent distress.  HEAD, EYES, EARS, NOSE AND THROAT: Atraumatic, normocephalic. Extraocular muscles are intact. Pupils equal and reactive to light. Sclerae anicteric. No conjunctival injection. No oro-pharyngeal erythema.  NECK: Supple. There is no jugular venous distention. No bruits, no lymphadenopathy, no thyromegaly.  HEART: Regular rate and rhythm,. No murmurs, no rubs, no clicks.  LUNGS: Clear to auscultation bilaterally. No rales or rhonchi. No wheezes.  ABDOMEN: Soft, flat, nontender, nondistended. Has  good bowel sounds. No hepatosplenomegaly appreciated.  EXTREMITIES: No evidence of any cyanosis, clubbing, or peripheral edema.  +2 pedal and radial pulses bilaterally.  NEUROLOGIC: The patient is alert, awake, and oriented x3 with no focal motor or sensory deficits appreciated bilaterally.  SKIN: Moist and warm with no rashes appreciated.  Psych: Not anxious, depressed LN: No inguinal LN enlargement    Antibiotics   Anti-infectives (From admission, onward)   Start     Dose/Rate Route Frequency Ordered Stop   08/29/17 1000  cefTRIAXone (ROCEPHIN) 1 g in sodium chloride 0.9 % 100 mL IVPB     1 g 200 mL/hr over 30 Minutes Intravenous  Once 08/29/17 0952 08/29/17 1222      Medications   Scheduled Meds: . aspirin  325 mg Oral Daily  . atorvastatin  40 mg Oral QHS  . bupivacaine  10 mL Intra-articular Once  . carvedilol  25 mg Oral BID WC  . colchicine  0.6 mg Oral BID  . fentaNYL  50 mcg Transdermal Q72H  . gabapentin  400 mg Oral TID  . heparin  5,000 Units Subcutaneous Q8H  . hydrALAZINE  25 mg Oral TID  . insulin aspart  0-5 Units Subcutaneous QHS  . insulin aspart  0-9 Units Subcutaneous TID WC  . insulin glargine  30 Units Subcutaneous QHS  . multivitamin with minerals  1 tablet Oral Daily  . pantoprazole  40 mg Oral Daily  . pentoxifylline  400 mg Oral TID  . potassium  chloride SA  20 mEq Oral BID  . [START ON 08/31/2017] predniSONE  40 mg Oral Q breakfast  . senna-docusate  1-2 tablet Oral BID  . sodium phosphate  1 enema Rectal Once  . triamcinolone acetonide  80 mg Intra-articular Once   Continuous Infusions: . sodium chloride 75 mL/hr at 08/30/17 1040   PRN Meds:.acetaminophen **OR** acetaminophen, albuterol, bisacodyl, clobetasol ointment, ipratropium-albuterol, morphine injection, ondansetron **OR** ondansetron (ZOFRAN) IV, oxyCODONE, polyethylene glycol   Data Review:   Micro Results Recent Results (from the past 240 hour(s))  Urine culture     Status: None   Collection Time: 08/29/17  7:04 AM  Result Value Ref Range Status   Specimen Description   Final    URINE, CATHETERIZED Performed at [facility], [address], [place], [place] [address]    Special Requests   Final    Normal Performed at [facility], [address] [place], [place] [address]    Culture   Final    NO GROWTH Performed at [facility], [place] [address], [place], [place] [address]    Report Status 08/30/2017 FINAL  Final  Body fluid culture     Status: None (Preliminary result)   Collection Time: 08/29/17  8:00 PM  Result Value Ref Range Status   Specimen Description   Final    SYNOVIAL RIGHT KNEE Performed at [facility], [address] [place], [place] [address]    Special Requests   Final    NONE Performed at [facility], [address] [place], [place] [address]    Gram Stain   Final    FEW WBC PRESENT, PREDOMINANTLY PMN NO ORGANISMS SEEN Performed at [facility], [place] [address] [place],  [address]    Culture PENDING  Incomplete   Report Status PENDING  Incomplete    Radiology Reports Dg Ankle Complete Left  Result Date: 08/29/2017 CLINICAL DATA:  Pain following fall EXAM: LEFT ANKLE  COMPLETE - 3+ VIEW COMPARISON:  None. FINDINGS: Frontal, oblique, and lateral views were obtained.  There is generalized soft tissue swelling. No fracture or joint effusion. No appreciable joint space narrowing or erosion. Ankle mortise appears intact. There are spurs arising from the posterior and inferior calcaneus. IMPRESSION: Calcaneal spurs. Diffuse soft tissue swelling. No fracture. Ankle mortise appears intact. Electronically Signed   By: Lowella Grip III M.D.   On: 08/29/2017 08:20   US Renal  Result Date: 08/29/2017 CLINICAL DATA:  Acute renal failure. EXAM: RENAL / URINARY TRACT ULTRASOUND COMPLETE COMPARISON:  None. FINDINGS: Right Kidney: Length: 10.7 cm. Echogenicity within normal limits. No mass or hydronephrosis visualized. Left Kidney: Length: 12.7 cm. Echogenicity within normal limits. No mass or hydronephrosis visualized. Bladder: Appears normal for degree of bladder distention. Postvoid residual of 128 cc. IMPRESSION: 1. No acute abnormality. Normal sonographic appearance of the bilateral kidneys. 2. Postvoid bladder residual of 128 cc. Electronically Signed   By: Titus Dubin M.D.   On: 08/29/2017 15:34   Dg Chest Port 1 View  Result Date: 08/29/2017 CLINICAL DATA:  Several recent falls.  Hypertension. EXAM: PORTABLE CHEST 1 VIEW COMPARISON:  Chest radiograph August 30, 2016 and chest CT September 15, 2016 FINDINGS: There is mild atelectatic change in the left base with small left pleural effusion. Lungs elsewhere are clear. Heart is mildly enlarged with pulmonary vascularity normal. Patient is status post coronary artery bypass grafting. No adenopathy. No bone lesions. IMPRESSION: Stable cardiac prominence. Mild left base atelectasis with small left pleural effusion. Lungs elsewhere clear. Electronically Signed   By: Lowella Grip III M.D.   On: 08/29/2017 07:27   Dg Knee Complete 4 Views Right  Result Date: 08/29/2017 CLINICAL DATA:  Pain following fall EXAM: RIGHT KNEE - COMPLETE 4+ VIEW COMPARISON:  None. FINDINGS: Frontal, lateral, and bilateral oblique views were  obtained. No acute fracture or dislocation. There is a small joint effusion. There is mild narrowing medially and in the patellofemoral joint regions. No erosive change. IMPRESSION: Small joint effusion. No acute fracture or dislocation. Joint space narrowing medially and in the patellofemoral joint regions. Electronically Signed   By: Lowella Grip III M.D.   On: 08/29/2017 08:20     CBC Recent Labs  Lab 08/29/17 0704 08/30/17 0947  WBC 14.0* 6.3  HGB 13.3 13.4  HCT 38.8* 40.6  PLT 184 181  MCV 93.7 95.6  MCH 32.2 31.7  MCHC 34.3 33.1  RDW 13.5 13.4  LYMPHSABS 1.1  --   MONOABS 1.0  --   EOSABS 0.2  --   BASOSABS 0.0  --     Chemistries  Recent Labs  Lab 08/29/17 0704 08/29/17 1416 08/30/17 1043  NA 134*  --  136  K 2.9*  --  4.0  CL 91*  --  100*  CO2 28  --  24  GLUCOSE 168*  --  283*  BUN 44*  --  32*  CREATININE 2.43*  --  1.59*  CALCIUM 8.6*  --  8.5*  MG  --  2.1  --   AST 48*  --   --   ALT 26  --   --   ALKPHOS 71  --   --   BILITOT 1.1  --   --    ------------------------------------------------------------------------------------------------------------------ estimated creatinine clearance is 53.2 mL/min (A) (by C-G formula based on SCr of 1.59 mg/dL (H)). ------------------------------------------------------------------------------------------------------------------ Recent Labs    08/29/17 1416  HGBA1C 6.6*   ------------------------------------------------------------------------------------------------------------------ No  results for input(s): CHOL, HDL, LDLCALC, TRIG, CHOLHDL, LDLDIRECT in the last 72 hours. ------------------------------------------------------------------------------------------------------------------ No results for input(s): TSH, T4TOTAL, T3FREE, THYROIDAB in the last 72 hours.  Invalid input(s):  FREET3 ------------------------------------------------------------------------------------------------------------------ No results for input(s): VITAMINB12, FOLATE, FERRITIN, TIBC, IRON, RETICCTPCT in the last 72 hours.  Coagulation profile No results for input(s): INR, PROTIME in the last 168 hours.  No results for input(s): DDIMER in the last 72 hours.  Cardiac Enzymes Recent Labs  Lab 08/29/17 0704  TROPONINI <0.03   ------------------------------------------------------------------------------------------------------------------ Invalid input(s): POCBNP    Assessment & Plan   1 acute kidney injury with chronic kidney disease III Due to dehydration and diuretic Patient's renal function improved with IV fluids  2 acute hypokalemia, hyponatremia, hypochloremia Secondary to diuretic use Now improved  3 acute right knee pain/left ankle pain This is due to gout I will start patient on prednisone already oncologist   4 chronic extreme morbid obesity Most likely secondary to excess calories Lifestyle modification recommended  5 chronic hypoxic respiratory failure Noncompliant with O2 requirement at night-the patient    6 chronic obstructive sleep apnea Noncompliant with CPAP due to claustrophobia Continue supplemental oxygen as needed  7 chronic diabetes mellitus type 2 Stable Hold Victoza continue long-acting insulin, sliding scale insulin with A hemoglobin A1c 6.6  8 history of CVA Aspiration/fall precautions Physical therapy to evaluate/treat  9 acute leukocytosis Etiology unknown UA unimpressive, chest x-ray noted for left atelectasis with pleural effusion Repeat CBC in the morning, avoid antibiotics at this time  10 chronic GERD without esophagitis PPI daily  Generalized weakness obtain PT evaluation     Code Status Orders  (From admission, onward)        Start     Ordered   08/29/17 1211  Full code  Continuous     08/29/17 1210     Code Status History    Date Active Date Inactive Code Status Order ID Comments User Context   09/15/2016 2233 09/16/2016 1942 Full Code [id number]  [name], DO Inpatient   02/12/2015 2156 02/13/2015 1659 Full Code [id number]  Henreitta Leber, MD Inpatient           Consults none  DVT Prophylaxis  Lovenox   Lab Results  Component Value Date   PLT 181 08/30/2017     Time Spent in minutes  56min Greater than 50% of time spent in care coordination and counseling patient regarding the condition and plan of care.   Dustin Flock M.D on 08/30/2017 at 1:32 PM  Between 7am to 6pm - Pager - [phone]  After 6pm go to [email or web address] - Proofreader  [employer] [facility]   Office  [phone]

## 2017-08-30 NOTE — Progress Notes (Signed)
Subjective: The patient notes substantial improvement in his right knee symptoms today.  He feels that the injection was quite beneficial.  He has no new orthopedic concerns at this time.   Objective: Vital signs in last 24 hours: Temp:  [97.7 F (36.5 C)-98.6 F (37 C)] 97.7 F (36.5 C) (03/28 1246) Pulse Rate:  [67-77] 68 (03/28 1452) Resp:  [17-21] 18 (03/28 1246) BP: (124-156)/(70-81) 136/76 (03/28 1246) SpO2:  [95 %-98 %] 98 % (03/28 1452)  Intake/Output from previous day: 03/27 0701 - 03/28 0700 In: 1627 [P.O.:480; I.V.:1147] Out: 600 [Urine:600] Intake/Output this shift: Total I/O In: 3448 [P.O.:480; I.V.:2968] Out: 675 [Urine:675]  Recent Labs    08/29/17 0704 08/30/17 0947  HGB 13.3 13.4   Recent Labs    08/29/17 0704 08/30/17 0947  WBC 14.0* 6.3  RBC 4.14* 4.24*  HCT 38.8* 40.6  PLT 184 181   Recent Labs    08/29/17 0704 08/30/17 1043  NA 134* 136  K 2.9* 4.0  CL 91* 100*  CO2 28 24  BUN 44* 32*  CREATININE 2.43* 1.59*  GLUCOSE 168* 283*  CALCIUM 8.6* 8.5*   No results for input(s): LABPT, INR in the last 72 hours.  Physical Exam: On examination, the patient's right knee shows no overlying erythema, ecchymosis, abrasions, or other skin abnormalities.  There is mild residual swelling, as well as a small effusion.  He has minimal tenderness to palpation over the medial joint line, but there is no lateral joint line tenderness.  He is able to actively range his knee from 0-100 degrees without any discomfort.  His patella tracks well and is without crepitance.  He has no ligamentous laxity.  He is neurovascularly intact to the right lower extremity and foot, other than the chronic venous stasis changes noted previously.  Assessment: Right knee pain and effusion secondary to gouty flare.  Plan: The patient may be mobilized with physical therapy as symptoms permit.  I will sign off at this time.  He may follow-up in my office on an as necessary basis.     Thank you for asking me to participate in the care of this most pleasant man.  Please recontact me if you have any further orthopedic questions or concerns.   Verdell Dykman Poggi 08/30/2017, 6:33 PM

## 2017-08-31 DIAGNOSIS — M109 Gout, unspecified: Secondary | ICD-10-CM | POA: Insufficient documentation

## 2017-08-31 DIAGNOSIS — N3000 Acute cystitis without hematuria: Secondary | ICD-10-CM | POA: Diagnosis not present

## 2017-08-31 DIAGNOSIS — S8391XA Sprain of unspecified site of right knee, initial encounter: Secondary | ICD-10-CM | POA: Diagnosis not present

## 2017-08-31 LAB — PROTEIN ELECTROPHORESIS, SERUM
A/G Ratio: 0.8 (ref 0.7–1.7)
ALPHA-2-GLOBULIN: 0.9 g/dL (ref 0.4–1.0)
Albumin ELP: 2.5 g/dL — ABNORMAL LOW (ref 2.9–4.4)
Alpha-1-Globulin: 0.3 g/dL (ref 0.0–0.4)
Beta Globulin: 0.9 g/dL (ref 0.7–1.3)
GLOBULIN, TOTAL: 3 g/dL (ref 2.2–3.9)
Gamma Globulin: 1 g/dL (ref 0.4–1.8)
Total Protein ELP: 5.5 g/dL — ABNORMAL LOW (ref 6.0–8.5)

## 2017-08-31 LAB — BASIC METABOLIC PANEL
Anion gap: 10 (ref 5–15)
BUN: 36 mg/dL — AB (ref 6–20)
CHLORIDE: 102 mmol/L (ref 101–111)
CO2: 24 mmol/L (ref 22–32)
Calcium: 8.3 mg/dL — ABNORMAL LOW (ref 8.9–10.3)
Creatinine, Ser: 1.37 mg/dL — ABNORMAL HIGH (ref 0.61–1.24)
GFR calc Af Amer: 57 mL/min — ABNORMAL LOW (ref >60)
GFR calc non Af Amer: 50 mL/min — ABNORMAL LOW (ref >60)
GLUCOSE: 271 mg/dL — AB (ref 65–99)
POTASSIUM: 4 mmol/L (ref 3.5–5.1)
SODIUM: 136 mmol/L (ref 135–145)

## 2017-08-31 LAB — GLUCOSE, CAPILLARY
GLUCOSE-CAPILLARY: 250 mg/dL — AB (ref 65–99)
Glucose-Capillary: 241 mg/dL — ABNORMAL HIGH (ref 65–99)

## 2017-08-31 LAB — ANA W/REFLEX IF POSITIVE: ANA: NEGATIVE

## 2017-08-31 MED ORDER — COLCHICINE 0.6 MG PO TABS: 0.6000 mg | ORAL_TABLET | Freq: Every day | ORAL | 11 refills | Status: DC

## 2017-08-31 MED ORDER — PREDNISONE 20 MG PO TABS: 40.0000 mg | ORAL_TABLET | Freq: Every day | ORAL | 0 refills | Status: AC

## 2017-08-31 NOTE — Progress Notes (Signed)
Inpatient Diabetes Program Recommendations  AACE/ADA: New Consensus Statement on Inpatient Glycemic Control (2015)  Target Ranges:  Prepandial:   less than 140 mg/dL      Peak postprandial:   less than 180 mg/dL (1-2 hours)      Critically ill patients:  140 - 180 mg/dL   Results for Adam Merritt, Adam Merritt (MRN [id number]) as of 08/31/2017 08:27  Ref. Range 08/29/2017 12:24 08/30/2017 17:15 08/30/2017 22:38 08/31/2017 07:46  Glucose-Capillary Latest Ref Range: 65 - 99 mg/dL 129 (H) 349 (H)  7 units NOVOLOG  233 (H)  2 units NOVOLOG +  30 units LANTUS 250 (H)  3 units NOVOLOG      Home DM Meds: Basaglar 30 units QHS        Victoza 1.8 mg daily  Current Orders: Lantus 30 units QHS      Novolog Sensitive Correction Scale/ SSI (0-9 units) TID AC + HS      MD- Note patient Getting Prednisone 40 mg daily.  CBG elevated to 250 mg/dl this AM.  Please consider the following:  1. Increase Lantus to 35 units QHS  2. Start Novolog Meal Coverage: Novolog 6 units TID with meals (hold if pt eats <50% of meal)     --Will follow patient during hospitalization--  Wyn Quaker RN, MSN, CDE Diabetes Coordinator Inpatient Glycemic Control Team Team Pager: [phone] (8a-5p)

## 2017-08-31 NOTE — Progress Notes (Signed)
[facility]  ROUNDING NOTE   Subjective:  Patient appears to be doing better. Creatinine down to 1.37. Normal renal ultrasound noted. Good urine output noted.   Objective:  Vital signs in last 24 hours:  Temp:  [97.6 F (36.4 C)-98.2 F (36.8 C)] 98.2 F (36.8 C) (03/29 0811) Pulse Rate:  [60-74] 64 (03/29 0811) Resp:  [18-20] 18 (03/29 0811) BP: (136-151)/(76-91) 138/77 (03/29 0913) SpO2:  [94 %-99 %] 99 % (03/29 0811)  Weight change:  Filed Weights   08/29/17 0659  Weight: 131.5 kg (290 lb)    Intake/Output: I/O last 3 completed shifts: In: 5479.3 [P.O.:600; I.V.:4879.3] Out: 2725 [Urine:2725]   Intake/Output this shift:  Total I/O In: 149 [I.V.:149] Out: -   Physical Exam: General: No acute distress  Head: Normocephalic, atraumatic. Moist oral mucosal membranes  Eyes: Anicteric  Neck: Supple, trachea midline  Lungs:  Clear to auscultation, normal effort  Heart: S1S2 no rubs  Abdomen:  Soft, nontender, bowel sounds present  Extremities: 1+ peripheral edema.  Neurologic: Awake, alert, following commands  Skin: No lesions       Basic Metabolic Panel: Recent Labs  Lab 08/29/17 0704 08/29/17 1416 08/30/17 1043 08/31/17 0554  NA 134*  --  136 136  K 2.9*  --  4.0 4.0  CL 91*  --  100* 102  CO2 28  --  24 24  GLUCOSE 168*  --  283* 271*  BUN 44*  --  32* 36*  CREATININE 2.43*  --  1.59* 1.37*  CALCIUM 8.6*  --  8.5* 8.3*  MG  --  2.1  --   --     Liver Function Tests: Recent Labs  Lab 08/29/17 0704  AST 48*  ALT 26  ALKPHOS 71  BILITOT 1.1  PROT 7.9  ALBUMIN 3.5   No results for input(s): LIPASE, AMYLASE in the last 168 hours. No results for input(s): AMMONIA in the last 168 hours.  CBC: Recent Labs  Lab 08/29/17 0704 08/30/17 0947  WBC 14.0* 6.3  NEUTROABS 11.6*  --   HGB 13.3 13.4  HCT 38.8* 40.6  MCV 93.7 95.6  PLT 184 181    Cardiac Enzymes: Recent Labs  Lab 08/29/17 0704  TROPONINI <0.03     BNP: Invalid input(s): POCBNP  CBG: Recent Labs  Lab 08/29/17 1224 08/30/17 1715 08/30/17 2238 08/31/17 0746  GLUCAP 129* 349* 233* 250*    Microbiology: Results for orders placed or performed during the hospital encounter of 08/29/17  Urine culture     Status: None   Collection Time: 08/29/17  7:04 AM  Result Value Ref Range Status   Specimen Description   Final    URINE, CATHETERIZED Performed at [facility], [address] [place], [place] [address]    Special Requests   Final    Normal Performed at [facility], [address] [place], [place] [address]    Culture   Final    NO GROWTH Performed at [facility], [place] [address], [place], [place] [address]    Report Status 08/30/2017 FINAL  Final  Body fluid culture     Status: None (Preliminary result)   Collection Time: 08/29/17  8:00 PM  Result Value Ref Range Status   Specimen Description   Final    SYNOVIAL RIGHT KNEE Performed at [facility], [address], [place], [place] [address]    Special Requests   Final    NONE Performed at  [place], [place] [address]    Gram Stain   Final    FEW WBC PRESENT, PREDOMINANTLY PMN NO ORGANISMS SEEN    Culture   Final    NO GROWTH 1 DAY Performed at [place] [address] [place], [place] [address]    Report Status PENDING  Incomplete    Coagulation Studies: No results for input(s): LABPROT, INR in the last 72 hours.  Urinalysis: Recent Labs    08/29/17 0704  COLORURINE YELLOW*  LABSPEC 1.012  PHURINE 6.0  GLUCOSEU NEGATIVE  HGBUR SMALL*  BILIRUBINUR NEGATIVE  KETONESUR NEGATIVE  PROTEINUR NEGATIVE  NITRITE NEGATIVE  LEUKOCYTESUR NEGATIVE      Imaging: US Renal  Result Date: 08/29/2017 CLINICAL DATA:  Acute renal failure. EXAM: RENAL / URINARY TRACT ULTRASOUND COMPLETE COMPARISON:  None. FINDINGS: Right Kidney: Length: 10.7 cm. Echogenicity within  normal limits. No mass or hydronephrosis visualized. Left Kidney: Length: 12.7 cm. Echogenicity within normal limits. No mass or hydronephrosis visualized. Bladder: Appears normal for degree of bladder distention. Postvoid residual of 128 cc. IMPRESSION: 1. No acute abnormality. Normal sonographic appearance of the bilateral kidneys. 2. Postvoid bladder residual of 128 cc. Electronically Signed   By: Titus Dubin M.D.   On: 08/29/2017 15:34     Medications:   . sodium chloride 75 mL/hr at 08/30/17 2323   . aspirin  325 mg Oral Daily  . atorvastatin  40 mg Oral QHS  . bupivacaine  10 mL Intra-articular Once  . carvedilol  25 mg Oral BID WC  . fentaNYL  50 mcg Transdermal Q72H  . gabapentin  400 mg Oral TID  . heparin  5,000 Units Subcutaneous Q8H  . hydrALAZINE  25 mg Oral TID  . insulin aspart  0-5 Units Subcutaneous QHS  . insulin aspart  0-9 Units Subcutaneous TID WC  . insulin glargine  30 Units Subcutaneous QHS  . multivitamin with minerals  1 tablet Oral Daily  . pantoprazole  40 mg Oral Daily  . pentoxifylline  400 mg Oral TID  . potassium chloride SA  20 mEq Oral BID  . predniSONE  40 mg Oral Q breakfast  . senna-docusate  1-2 tablet Oral BID  . sodium phosphate  1 enema Rectal Once  . triamcinolone acetonide  80 mg Intra-articular Once   acetaminophen **OR** acetaminophen, albuterol, bisacodyl, clobetasol ointment, ipratropium-albuterol, morphine injection, ondansetron **OR** ondansetron (ZOFRAN) IV, oxyCODONE, polyethylene glycol  Assessment/ Plan:  72 y.o. male with a PMHx of coronary artery disease status post CABG, diabetes mellitus type 2, GERD, hyperlipidemia, hypertension, peripheral neuropathy, history of CVA, who was admitted to [facility] on 08/29/2017 for evaluation of recent fall.   1.  Acute renal failure.    Baseline creatinine 1.1.  With IV fluid hydration serum creatinine is come down to 1.37.  Continue fluids while the patient remains admitted.  Otherwise avoid  nephrotoxins as possible.  2.  Hypertension.    Blood pressure currently 138/77.  Maintain the patient on hydralazine and carvedilol.  3.  Lower extremity edema.  Continue to hold diuretic therapy for now.     LOS: 2 Nyajah Hyson 3/29/201910:56 AM

## 2017-08-31 NOTE — Discharge Instructions (Signed)
[facility] - [place] at [facility] ° °DIET:  °Cardiac diet, diabetic diet ° °DISCHARGE CONDITION:  °Stable ° °ACTIVITY:  °Activity as tolerated ° °OXYGEN:  °Home Oxygen: No. °  °Oxygen Delivery: room air ° °DISCHARGE LOCATION:  °home  ° ° °ADDITIONAL DISCHARGE INSTRUCTION: ° ° °If you experience worsening of your admission symptoms, develop shortness of breath, life threatening emergency, suicidal or homicidal thoughts you must seek medical attention immediately by calling 911 or calling your MD immediately  if symptoms less severe. ° °You Must read complete instructions/literature along with all the possible adverse reactions/side effects for all the Medicines you take and that have been prescribed to you. Take any new Medicines after you have completely understood and accpet all the possible adverse reactions/side effects.  ° °Please note ° °You were cared for by a hospitalist during your hospital stay. If you have any questions about your discharge medications or the care you received while you were in the hospital after you are discharged, you can call the unit and asked to speak with the hospitalist on call if the hospitalist that took care of you is not available. Once you are discharged, your primary care physician will handle any further medical issues. Please note that NO REFILLS for any discharge medications will be authorized once you are discharged, as it is imperative that you return to your primary care physician (or establish a relationship with a primary care physician if you do not have one) for your aftercare needs so that they can reassess your need for medications and monitor your lab values. ° ° °

## 2017-08-31 NOTE — Discharge Summary (Signed)
[address] at [facility], 72 y.o., DOB Oct 05, 1945, MRN [id number]. Admission date: 08/29/2017 Discharge Date 08/31/2017 Primary MD [name], MD Admitting Physician Gorden Harms, MD  Admission Diagnosis  Acute cystitis without hematuria [N30.00] Fall, initial encounter [W19.XXXA] Acute renal failure, unspecified acute renal failure type ([place]) [N17.9]  Discharge Diagnosis   Active Problems:  Acute kidney injury on chronic kidney injury stage III Acute hypokalemia hyponatremia and hypochloremia Acute knee pain due to gout attack Chronic hypoxic respiratory failure Morbid obesity Sleep apnea Diabetes type 2 History of CVA Leukocytosis reactive  Hospital Course Adam Merritt  is a 72 y.o. male with a known history per below which also includes chronic pain syndrome, chronic hypoxic respiratory failure-on 2 L at night-patient noncompliant with use over the last 1 week, unable to use CPAP due to claustrophobia, known history of frequent falls, patient has been seen by EMS numerous times over the last month-patient always refused to come to the emergency room for evaluation, patient fell and was was unable to get up therefore  was brought to the emergency room.  Patient in the emergency room was noted to have hyponatremia hypokalemia and acute renal failure.  Patient was given IV fluids.  With resolution of his renal failure as well as electrolyte imbalances.  Patient also was complaining of pain in his knee and therefore was seen by orthopedics.  And underwent arthrocentesis with steroid injection.  He was noted to have gout crystals therefore he was treated for acute gout flare.  He was seen by PT recommended home health PT.  Patient's doing much better stable for discharge to home.               Consults  orthopedic surgery , nephrology  Significant Tests:  See full reports for all details     Dg Ankle Complete Left  Result Date:  08/29/2017 CLINICAL DATA:  Pain following fall EXAM: LEFT ANKLE COMPLETE - 3+ VIEW COMPARISON:  None. FINDINGS: Frontal, oblique, and lateral views were obtained. There is generalized soft tissue swelling. No fracture or joint effusion. No appreciable joint space narrowing or erosion. Ankle mortise appears intact. There are spurs arising from the posterior and inferior calcaneus. IMPRESSION: Calcaneal spurs. Diffuse soft tissue swelling. No fracture. Ankle mortise appears intact. Electronically Signed   By: Lowella Grip III M.D.   On: 08/29/2017 08:20   US Renal  Result Date: 08/29/2017 CLINICAL DATA:  Acute renal failure. EXAM: RENAL / URINARY TRACT ULTRASOUND COMPLETE COMPARISON:  None. FINDINGS: Right Kidney: Length: 10.7 cm. Echogenicity within normal limits. No mass or hydronephrosis visualized. Left Kidney: Length: 12.7 cm. Echogenicity within normal limits. No mass or hydronephrosis visualized. Bladder: Appears normal for degree of bladder distention. Postvoid residual of 128 cc. IMPRESSION: 1. No acute abnormality. Normal sonographic appearance of the bilateral kidneys. 2. Postvoid bladder residual of 128 cc. Electronically Signed   By: Titus Dubin M.D.   On: 08/29/2017 15:34   Dg Chest Port 1 View  Result Date: 08/29/2017 CLINICAL DATA:  Several recent falls.  Hypertension. EXAM: PORTABLE CHEST 1 VIEW COMPARISON:  Chest radiograph August 30, 2016 and chest CT September 15, 2016 FINDINGS: There is mild atelectatic change in the left base with small left pleural effusion. Lungs elsewhere are clear. Heart is mildly enlarged with pulmonary vascularity normal. Patient is status post coronary artery bypass grafting. No adenopathy. No bone lesions. IMPRESSION: Stable cardiac prominence. Mild left base atelectasis with small  left pleural effusion. Lungs elsewhere clear. Electronically Signed   By: Lowella Grip III M.D.   On: 08/29/2017 07:27   Dg Knee Complete 4 Views Right  Result Date:  08/29/2017 CLINICAL DATA:  Pain following fall EXAM: RIGHT KNEE - COMPLETE 4+ VIEW COMPARISON:  None. FINDINGS: Frontal, lateral, and bilateral oblique views were obtained. No acute fracture or dislocation. There is a small joint effusion. There is mild narrowing medially and in the patellofemoral joint regions. No erosive change. IMPRESSION: Small joint effusion. No acute fracture or dislocation. Joint space narrowing medially and in the patellofemoral joint regions. Electronically Signed   By: Lowella Grip III M.D.   On: 08/29/2017 08:20       Today   Subjective:   Adam Merritt feeling much better denies any complaints Objective:   Blood pressure (!) 152/74, pulse (!) 57, temperature 97.7 F (36.5 C), temperature source Oral, resp. rate 18, height 5\' 6"  (1.676 m), weight 290 lb (131.5 kg), SpO2 97 %.  .  Intake/Output Summary (Last 24 hours) at 08/31/2017 1401 Last data filed at 08/31/2017 1100 Gross per 24 hour  Intake 3673.25 ml  Output 1450 ml  Net 2223.25 ml    Exam VITAL SIGNS: Blood pressure (!) 152/74, pulse (!) 57, temperature 97.7 F (36.5 C), temperature source Oral, resp. rate 18, height 5\' 6"  (1.676 m), weight 290 lb (131.5 kg), SpO2 97 %.  GENERAL:  72 y.o.-year-old patient lying in the bed with no acute distress.  EYES: Pupils equal, round, reactive to light and accommodation. No scleral icterus. Extraocular muscles intact.  HEENT: Head atraumatic, normocephalic. Oropharynx and nasopharynx clear.  NECK:  Supple, no jugular venous distention. No thyroid enlargement, no tenderness.  LUNGS: Normal breath sounds bilaterally, no wheezing, rales,rhonchi or crepitation. No use of accessory muscles of respiration.  CARDIOVASCULAR: S1, S2 normal. No murmurs, rubs, or gallops.  ABDOMEN: Soft, nontender, nondistended. Bowel sounds present. No organomegaly or mass.  EXTREMITIES: No pedal edema, cyanosis, or clubbing.  NEUROLOGIC: Cranial nerves II through XII are intact.  Muscle strength 5/5 in all extremities. Sensation intact. Gait not checked.  PSYCHIATRIC: The patient is alert and oriented x 3.  SKIN: No obvious rash, lesion, or ulcer.   Data Review     CBC w Diff:  Lab Results  Component Value Date   WBC 6.3 08/30/2017   HGB 13.4 08/30/2017   HGB 13.8 09/28/2014   HCT 40.6 08/30/2017   HCT 40.9 09/28/2014   PLT 181 08/30/2017   PLT 142 (L) 09/28/2014   LYMPHOPCT 8 08/29/2017   LYMPHOPCT 18.9 09/28/2014   MONOPCT 7 08/29/2017   MONOPCT 7.3 09/28/2014   EOSPCT 2 08/29/2017   EOSPCT 3.7 09/28/2014   BASOPCT 0 08/29/2017   BASOPCT 0.5 09/28/2014   CMP:  Lab Results  Component Value Date   NA 136 08/31/2017   NA 138 09/28/2014   K 4.0 08/31/2017   K 3.5 09/28/2014   CL 102 08/31/2017   CL 103 09/28/2014   CO2 24 08/31/2017   CO2 27 09/28/2014   BUN 36 (H) 08/31/2017   BUN 12 09/28/2014   CREATININE 1.37 (H) 08/31/2017   CREATININE 1.41 (H) 09/28/2014   PROT 7.9 08/29/2017   PROT 6.8 09/28/2014   ALBUMIN 3.5 08/29/2017   ALBUMIN 3.6 09/28/2014   BILITOT 1.1 08/29/2017   BILITOT 0.6 09/28/2014   ALKPHOS 71 08/29/2017   ALKPHOS 88 09/28/2014   AST 48 (H) 08/29/2017   AST 27 09/28/2014  ALT 26 08/29/2017   ALT 22 09/28/2014  .  Micro Results Recent Results (from the past 240 hour(s))  Urine culture     Status: None   Collection Time: 08/29/17  7:04 AM  Result Value Ref Range Status   Specimen Description   Final    URINE, CATHETERIZED Performed at [facility], [address], [place], [place] [address]    Special Requests   Final    Normal Performed at [facility], [address] [place], [place] [address]    Culture   Final    NO GROWTH Performed at [facility], [place] [address], [place], [place] [address]    Report Status 08/30/2017 FINAL  Final  Body fluid culture     Status: None (Preliminary result)   Collection Time: 08/29/17  8:00 PM  Result Value Ref Range Status   Specimen  Description   Final    SYNOVIAL RIGHT KNEE Performed at [facility], [address], [place], [place] [address]    Special Requests   Final    NONE Performed at [facility], [address] [place], [place] [address]    Gram Stain   Final    FEW WBC PRESENT, PREDOMINANTLY PMN NO ORGANISMS SEEN    Culture   Final    NO GROWTH 1 DAY Performed at [facility], [place] [address] [place], [place] [address]    Report Status PENDING  Incomplete        Code Status Orders  (From admission, onward)        Start     Ordered   08/29/17 1211  Full code  Continuous     08/29/17 1210    Code Status History    Date Active Date Inactive Code Status Order ID Comments User Context   09/15/2016 2233 09/16/2016 1942 Full Code [id number]  [name], DO Inpatient   02/12/2015 2156 02/13/2015 1659 Full Code [id number]  Henreitta Leber, MD Inpatient          Follow-up Information    [name], MD. Go on 09/12/2017.   Specialty:  Internal Medicine Why:  Wednesday at 10:15am for hospital follow-up Contact information: [address] [place] [address] [phone]           Discharge Medications   Allergies as of 08/31/2017   No Known Allergies     Medication List    STOP taking these medications   furosemide 40 MG tablet Commonly known as:  LASIX   metolazone 2.5 MG tablet Commonly known as:  ZAROXOLYN   potassium chloride SA 20 MEQ tablet Commonly known as:  K-DUR,KLOR-CON     TAKE these medications   albuterol 108 (90 Base) MCG/ACT inhaler Commonly known as:  PROVENTIL HFA;VENTOLIN HFA Inhale 1 puff into the lungs every 6 (six) hours as needed for wheezing or shortness of breath.   aspirin 325 MG EC tablet Take 325 mg by mouth daily.   atorvastatin 40 MG tablet Commonly known as:  LIPITOR Take 40 mg by mouth at bedtime.   BASAGLAR KWIKPEN 100 UNIT/ML Sopn Inject 30 Units into the skin at bedtime.   carvedilol 25 MG  tablet Commonly known as:  COREG Take 25 mg by mouth 2 (two) times daily with a meal.   clobetasol ointment 0.05 % Commonly known as:  TEMOVATE Apply to all affected areas on lower legs and feet bid   colchicine 0.6 MG tablet Take  1 tablet (0.6 mg total) by mouth daily.   fentaNYL 50 MCG/HR Commonly known as:  DURAGESIC - dosed mcg/hr Place 1 patch (50 mcg total) onto the skin every 3 (three) days.   gabapentin 400 MG capsule Commonly known as:  NEURONTIN Take 400 mg by mouth 3 (three) times daily.   hydrALAZINE 25 MG tablet Commonly known as:  APRESOLINE Take 25 mg by mouth 3 (three) times daily.   ibuprofen 600 MG tablet Commonly known as:  ADVIL,MOTRIN Take 600 mg by mouth 2 (two) times daily as needed.   ipratropium-albuterol 0.5-2.5 (3) MG/3ML Soln Commonly known as:  DUONEB Take 3 mLs by nebulization every 4 (four) hours as needed (for shortness of breath).   multivitamin with minerals Tabs tablet Take 1 tablet by mouth daily.   omeprazole 20 MG capsule Commonly known as:  PRILOSEC Take 20 mg by mouth daily.   ondansetron 8 MG tablet Commonly known as:  ZOFRAN Take 1 tablet (8 mg total) by mouth every 8 (eight) hours as needed for nausea or vomiting.   Oxycodone HCl 10 MG Tabs Take 1 tablet (10 mg total) by mouth 3 (three) times daily as needed.   pentoxifylline 400 MG CR tablet Commonly known as:  TRENTAL Take 400 mg by mouth 3 (three) times daily.   predniSONE 20 MG tablet Commonly known as:  DELTASONE Take 2 tablets (40 mg total) by mouth daily with breakfast for 4 days. Start taking on:  09/01/2017   sennosides-docusate sodium 8.6-50 MG tablet Commonly known as:  SENOKOT-S Take 1-2 tablets by mouth 2 (two) times daily.   VICTOZA 18 MG/3ML Sopn Generic drug:  liraglutide Inject 1.8 mg into the skin daily.   zolpidem 12.5 MG CR tablet Commonly known as:  AMBIEN CR Take 1 tablet (12.5 mg total) by mouth at bedtime as needed for sleep.           Total Time in preparing paper work, data evaluation and todays exam - 35 minutes  Dustin Flock M.D on 08/31/2017 at 2:01 PM [place]  [phone]

## 2017-08-31 NOTE — Care Management (Signed)
Physical therapy evaluation completed. Recommending home with Home Health/Physical therapy. Discussed services at the bedside. States that [facility] already comes to his home. Skilled nursing and physical therapy. Will update Caryl Asp, [facility] representative updated.  Lives with wife. Last seen [place] NP 08/13/17. Rolling walker, rollayor, shower chair, and ramp in the home.  Discharge to home today per Dr. Posey Pronto Family will transport Shelbie Ammons RN MSN [facility] [phone]

## 2017-09-02 DIAGNOSIS — L209 Atopic dermatitis, unspecified: Secondary | ICD-10-CM | POA: Insufficient documentation

## 2017-09-02 DIAGNOSIS — G894 Chronic pain syndrome: Secondary | ICD-10-CM | POA: Insufficient documentation

## 2017-09-02 LAB — BODY FLUID CULTURE: Culture: NO GROWTH

## 2017-09-03 LAB — PROTEIN ELECTRO, RANDOM URINE
ALBUMIN ELP UR: 18.8 %
ALPHA-1-GLOBULIN, U: 4.3 %
ALPHA-2-GLOBULIN, U: 18.7 %
BETA GLOBULIN, U: 38.4 %
GAMMA GLOBULIN, U: 19.8 %
M Component, Ur: 22.9 % — ABNORMAL HIGH
TOTAL PROTEIN, URINE-UPE24: 21.3 mg/dL

## 2017-09-04 ENCOUNTER — Encounter: Payer: Self-pay | Admitting: Internal Medicine

## 2017-09-04 ENCOUNTER — Ambulatory Visit (INDEPENDENT_AMBULATORY_CARE_PROVIDER_SITE_OTHER): Payer: Medicare Other | Admitting: Internal Medicine

## 2017-09-04 DIAGNOSIS — I890 Lymphedema, not elsewhere classified: Secondary | ICD-10-CM

## 2017-09-04 DIAGNOSIS — N183 Chronic kidney disease, stage 3 unspecified: Secondary | ICD-10-CM

## 2017-09-04 DIAGNOSIS — E1142 Type 2 diabetes mellitus with diabetic polyneuropathy: Secondary | ICD-10-CM

## 2017-09-04 DIAGNOSIS — E1165 Type 2 diabetes mellitus with hyperglycemia: Secondary | ICD-10-CM

## 2017-09-04 DIAGNOSIS — M10361 Gout due to renal impairment, right knee: Secondary | ICD-10-CM | POA: Diagnosis not present

## 2017-09-04 MED ORDER — FEBUXOSTAT 40 MG PO TABS: ORAL_TABLET | ORAL | 2 refills | Status: DC

## 2017-09-04 NOTE — Progress Notes (Signed)
[facility] [place], [place] [address]  Internal MEDICINE  Office Visit Note  Patient Name: Adam Merritt  [id number]  [id number]  Date of Service: 09/04/2017  Chief Complaint  Patient presents with  . Hospitalization Follow-up    fell 3 times in 2 days    HPI  Pt is here for follow up after hospital admission, He was c/o knee pain with acute gout, he was found to be in ARF, and all diuretics were held at the time, potassium was elevated. Wife has all 3 bottles with her ( potassium, lasix and Metolazone). She is holding his SS insulin also. He is c/o leg and feet pain. Pt has chronic lymphedema with severe chronic venous stasis. Was getting some sort of home health but not anymore. He does respond to chronic pain therapy with Duragesic patch and prn use of Oxycodone. Wife is in the room with him. Compliacne with diabetic diet is poor.Labs did show elevated uric acid level.    Current Medication: Outpatient Encounter Medications as of 09/04/2017  Medication Sig  . albuterol (PROVENTIL HFA;VENTOLIN HFA) 108 (90 BASE) MCG/ACT inhaler Inhale 1 puff into the lungs every 6 (six) hours as needed for wheezing or shortness of breath.  Marland Kitchen aspirin 325 MG EC tablet Take 325 mg by mouth daily.  Marland Kitchen atorvastatin (LIPITOR) 40 MG tablet Take 40 mg by mouth at bedtime.   . carvedilol (COREG) 25 MG tablet Take 25 mg by mouth 2 (two) times daily with a meal.  . clobetasol ointment (TEMOVATE) 0.05 % Apply to all affected areas on lower legs and feet bid  . colchicine 0.6 MG tablet Take 1 tablet (0.6 mg total) by mouth daily.  . febuxostat (ULORIC) 40 MG tablet Take one tab a day for gout, cannot take allopurinol  . fentaNYL (DURAGESIC - DOSED MCG/HR) 50 MCG/HR Place 1 patch (50 mcg total) onto the skin every 3 (three) days.  Marland Kitchen gabapentin (NEURONTIN) 400 MG capsule Take 400 mg by mouth 3 (three) times daily.   . hydrALAZINE (APRESOLINE) 25 MG tablet Take 25 mg by mouth 3 (three)  times daily.  Marland Kitchen ibuprofen (ADVIL,MOTRIN) 600 MG tablet Take 600 mg by mouth 2 (two) times daily as needed.  . Insulin Glargine (BASAGLAR KWIKPEN) 100 UNIT/ML SOPN Inject 30 Units into the skin at bedtime.  Marland Kitchen ipratropium-albuterol (DUONEB) 0.5-2.5 (3) MG/3ML SOLN Take 3 mLs by nebulization every 4 (four) hours as needed (for shortness of breath).  . Liraglutide (VICTOZA) 18 MG/3ML SOPN Inject 1.8 mg into the skin daily.   . Multiple Vitamin (MULTIVITAMIN WITH MINERALS) TABS tablet Take 1 tablet by mouth daily.  Marland Kitchen omeprazole (PRILOSEC) 20 MG capsule Take 20 mg by mouth daily.  . ondansetron (ZOFRAN) 8 MG tablet Take 1 tablet (8 mg total) by mouth every 8 (eight) hours as needed for nausea or vomiting.  . Oxycodone HCl 10 MG TABS Take 1 tablet (10 mg total) by mouth 3 (three) times daily as needed.  . pentoxifylline (TRENTAL) 400 MG CR tablet Take 400 mg by mouth 3 (three) times daily.  . predniSONE (DELTASONE) 20 MG tablet Take 2 tablets (40 mg total) by mouth daily with breakfast for 4 days.  . sennosides-docusate sodium (SENOKOT-S) 8.6-50 MG tablet Take 1-2 tablets by mouth 2 (two) times daily.  Marland Kitchen zolpidem (AMBIEN CR) 12.5 MG CR tablet Take 1 tablet (12.5 mg total) by mouth at bedtime as needed for sleep.   No facility-administered encounter medications on file  as of 09/04/2017.     Surgical History: Past Surgical History:  Procedure Laterality Date  . CHOLECYSTECTOMY    . CORONARY ARTERY BYPASS GRAFT      Medical History: Past Medical History:  Diagnosis Date  . Anemia   . CAD (coronary artery disease)   . Chest pain   . Coronary artery disease   . Diabetes mellitus without complication ([place])   . Esophageal reflux   . Herpes zoster without mention of complication   . Hyperlipidemia   . Hypertension   . Insomnia   . Lumbago   . Neuropathy in diabetes ([place])   . Obstructive chronic bronchitis without exacerbation ([place])   . Other malaise and fatigue   . Stroke ([place])   . Varicose  veins     Family History: Family History  Problem Relation Age of Onset  . Diabetes Mother   . Hyperlipidemia Mother   . Hypertension Mother   . Diabetes Father   . Hyperlipidemia Father   . Hypertension Father     Social History   Socioeconomic History  . Marital status: Married    Spouse name: Not on file  . Number of children: Not on file  . Years of education: Not on file  . Highest education level: Not on file  Occupational History  . Not on file  Social Needs  . Financial resource strain: Not on file  . Food insecurity:    Worry: Not on file    Inability: Not on file  . Transportation needs:    Medical: Not on file    Non-medical: Not on file  Tobacco Use  . Smoking status: Never Smoker  . Smokeless tobacco: Never Used  Substance and Sexual Activity  . Alcohol use: No  . Drug use: No  . Sexual activity: Not on file  Lifestyle  . Physical activity:    Days per week: Not on file    Minutes per session: Not on file  . Stress: Not on file  Relationships  . Social connections:    Talks on phone: Not on file    Gets together: Not on file    Attends religious service: Not on file    Active member of club or organization: Not on file    Attends meetings of clubs or organizations: Not on file    Relationship status: Not on file  . Intimate partner violence:    Fear of current or ex partner: Not on file    Emotionally abused: Not on file    Physically abused: Not on file    Forced sexual activity: Not on file  Other Topics Concern  . Not on file  Social History Narrative  . Not on file    Review of Systems  Constitutional: Negative for chills, fatigue and unexpected weight change.  HENT: Positive for postnasal drip. Negative for congestion, rhinorrhea, sneezing and sore throat.   Eyes: Negative for redness.  Respiratory: Negative for cough, chest tightness and shortness of breath.   Cardiovascular: Positive for leg swelling. Negative for chest pain and  palpitations.  Gastrointestinal: Negative for abdominal pain, constipation, diarrhea, nausea and vomiting.  Genitourinary: Negative for dysuria and frequency.  Musculoskeletal: Positive for gait problem and joint swelling. Negative for arthralgias, back pain and neck pain.  Skin: Negative for rash.  Neurological: Negative for tremors and numbness.  Hematological: Negative for adenopathy. Does not bruise/bleed easily.  Psychiatric/Behavioral: Negative for behavioral problems (Depression), sleep disturbance and suicidal ideas. The patient  is not nervous/anxious.     Vital Signs: BP 110/64 (BP Location: Left Arm, Patient Position: Sitting)   Pulse 69   Resp 16   Ht 5\' 6"  (1.676 m)   Wt 297 lb 6.4 oz (134.9 kg)   SpO2 97%   BMI 48.00 kg/m    Physical Exam  Constitutional: He is oriented to person, place, and time. He appears well-developed and well-nourished. No distress.  HENT:  Head: Normocephalic and atraumatic.  Mouth/Throat: Oropharynx is clear and moist. No oropharyngeal exudate.  Eyes: Pupils are equal, round, and reactive to light. EOM are normal.  Neck: Normal range of motion. Neck supple. No JVD present. No tracheal deviation present. No thyromegaly present.  Cardiovascular: Normal rate, regular rhythm and normal heart sounds. Exam reveals no gallop and no friction rub.  No murmur heard. Pulmonary/Chest: Effort normal. No respiratory distress. He has no wheezes. He has no rales. He exhibits no tenderness.  Abdominal: Soft. Bowel sounds are normal.  Musculoskeletal: Normal range of motion.  Lymphadenopathy:    He has no cervical adenopathy.  Neurological: He is alert and oriented to person, place, and time. No cranial nerve deficit.  Skin: Skin is warm and dry. Rash noted. He is not diaphoretic. There is erythema.  Skin breakdown and severe scarring.  Psychiatric: He has a normal mood and affect. His behavior is normal. Judgment and thought content normal.    Assessment/Plan: 1. Acute gout due to renal impairment involving right knee - febuxostat (ULORIC) 40 MG tablet; Take one tab a day for gout, cannot take allopurinol  Dispense: 90 tablet; Refill: 2 - Ambulatory referral to Home Health  2. Diabetic polyneuropathy associated with type 2 diabetes mellitus ([place]) - Ambulatory referral to Home Health  3. CKD (chronic kidney disease), stage III (HCC) - Basic metabolic panel  4. Lymphedema of both lower extremities - Ambulatory referral to Home Health. Might need PT and skin care managment  5. Uncontrolled type 2 diabetes mellitus with hyperglycemia ([facility]) - Pt was instructed to go back on  Victoza, SS and Bsalgar  General Counseling: Riel verbalizes understanding of the findings of todays visit and agrees with plan of treatment. I have discussed any further diagnostic evaluation that may be needed or ordered today. We also reviewed his medications today. he has been encouraged to call the office with any questions or concerns that should arise related to todays visit.  Cardiac risk factor modification:  1. Control blood pressure. 2. Exercise as prescribed. 3. Follow low sodium, low fat diet. and low fat and low cholestrol diet. 4. Take ASA 81mg  once a day.    Orders Placed This Encounter  Procedures  . Basic metabolic panel  . Ambulatory referral to [facility] ordered this encounter  Medications  . febuxostat (ULORIC) 40 MG tablet    Sig: Take one tab a day for gout, cannot take allopurinol    Dispense:  90 tablet    Refill:  2    Time spent:25 Minutes  Dr [name] Internal medicine

## 2017-09-05 ENCOUNTER — Telehealth: Payer: Self-pay

## 2017-09-05 LAB — BASIC METABOLIC PANEL
BUN / CREAT RATIO: 22 (ref 10–24)
BUN: 33 mg/dL — ABNORMAL HIGH (ref 8–27)
CO2: 18 mmol/L — ABNORMAL LOW (ref 20–29)
CREATININE: 1.48 mg/dL — AB (ref 0.76–1.27)
Calcium: 8.6 mg/dL (ref 8.6–10.2)
Chloride: 102 mmol/L (ref 96–106)
GFR calc Af Amer: 53 mL/min/{1.73_m2} — ABNORMAL LOW (ref >59)
GFR calc non Af Amer: 46 mL/min/{1.73_m2} — ABNORMAL LOW (ref >59)
GLUCOSE: 352 mg/dL — AB (ref 65–99)
Potassium: 5.6 mmol/L — ABNORMAL HIGH (ref 3.5–5.2)
Sodium: 135 mmol/L (ref 134–144)

## 2017-09-05 NOTE — Telephone Encounter (Signed)
GAVE VERBAL ORDER TO [facility] PHYSICAL THERAPHY TWICE A WEEK FOR WEEKS AS PER DFK [phone])

## 2017-09-07 ENCOUNTER — Telehealth: Payer: Self-pay | Admitting: Internal Medicine

## 2017-09-07 NOTE — Telephone Encounter (Signed)
Prior auth for [employer] has been approved , notified [facility]

## 2017-09-10 ENCOUNTER — Telehealth: Payer: Self-pay | Admitting: Internal Medicine

## 2017-09-10 ENCOUNTER — Other Ambulatory Visit: Payer: Self-pay

## 2017-09-10 MED ORDER — IPRATROPIUM-ALBUTEROL 0.5-2.5 (3) MG/3ML IN SOLN: 3.0000 mL | RESPIRATORY_TRACT | 3 refills | Status: DC | PRN

## 2017-09-10 MED ORDER — ALBUTEROL SULFATE HFA 108 (90 BASE) MCG/ACT IN AERS: 1.0000 | INHALATION_SPRAY | Freq: Four times a day (QID) | RESPIRATORY_TRACT | 5 refills | Status: DC | PRN

## 2017-09-10 NOTE — Telephone Encounter (Signed)
-----   Message from [name], MD sent at 09/10/2017  8:41 AM EDT ----- Regarding: labs  Potassium level is high He should not take any potassium and any diuretics at home, he is on Lasix and metalazone. Make sure wife understands  He can take a new diurectic, I will be sending a rx in few min

## 2017-09-10 NOTE — Telephone Encounter (Signed)
Spoke to wife and informed her to stop pt's potassium, lasix and metolazone per [facility]FK and that [facility] will be sending in a new diuretic to pharmacy.  Also I sent in refills for albuterol inhaler and Duo-neb neb solution.  dbs

## 2017-09-11 ENCOUNTER — Other Ambulatory Visit: Payer: Self-pay | Admitting: Internal Medicine

## 2017-09-11 ENCOUNTER — Telehealth: Payer: Self-pay

## 2017-09-11 MED ORDER — INDAPAMIDE 2.5 MG PO TABS: 2.5000 mg | ORAL_TABLET | Freq: Every day | ORAL | 3 refills | Status: DC

## 2017-09-11 MED ORDER — LEVOFLOXACIN 500 MG PO TABS: 500.0000 mg | ORAL_TABLET | Freq: Every day | ORAL | 0 refills | Status: DC

## 2017-09-11 NOTE — Telephone Encounter (Signed)
Pt wife called about uloric and colchicine as per dfk wife advised pt can finished colchicine and then start uloric

## 2017-09-11 NOTE — Telephone Encounter (Signed)
Spoke with pt wife that we going to send new pres for diurectic indapamide and his bronchitis levaquin and dr Humphrey Rolls like to see him 2 weeks

## 2017-09-12 ENCOUNTER — Ambulatory Visit: Payer: Self-pay | Admitting: Internal Medicine

## 2017-09-14 ENCOUNTER — Ambulatory Visit: Payer: Self-pay | Admitting: Nurse Practitioner

## 2017-09-20 ENCOUNTER — Ambulatory Visit (INDEPENDENT_AMBULATORY_CARE_PROVIDER_SITE_OTHER): Payer: Medicare Other | Admitting: Nurse Practitioner

## 2017-09-20 ENCOUNTER — Emergency Department: Payer: Medicare Other

## 2017-09-20 ENCOUNTER — Telehealth: Payer: Self-pay

## 2017-09-20 ENCOUNTER — Encounter: Payer: Self-pay | Admitting: Nurse Practitioner

## 2017-09-20 ENCOUNTER — Encounter: Payer: Self-pay | Admitting: Emergency Medicine

## 2017-09-20 ENCOUNTER — Inpatient Hospital Stay
Admission: EM | Admit: 2017-09-20 | Discharge: 2017-09-23 | DRG: 603 | Disposition: A | Discharge status: Home Health | Payer: Medicare Other | Attending: Specialist | Admitting: Specialist

## 2017-09-20 ENCOUNTER — Other Ambulatory Visit: Payer: Self-pay

## 2017-09-20 VITALS — BP 137/78 | HR 82 | Resp 16 | Ht 65.0 in | Wt 286.8 lb

## 2017-09-20 DIAGNOSIS — Z6841 Body Mass Index (BMI) 40.0 and over, adult: Secondary | ICD-10-CM | POA: Diagnosis not present

## 2017-09-20 DIAGNOSIS — E669 Obesity, unspecified: Secondary | ICD-10-CM | POA: Diagnosis present

## 2017-09-20 DIAGNOSIS — K219 Gastro-esophageal reflux disease without esophagitis: Secondary | ICD-10-CM | POA: Diagnosis present

## 2017-09-20 DIAGNOSIS — I2510 Atherosclerotic heart disease of native coronary artery without angina pectoris: Secondary | ICD-10-CM | POA: Diagnosis present

## 2017-09-20 DIAGNOSIS — N179 Acute kidney failure, unspecified: Secondary | ICD-10-CM | POA: Diagnosis present

## 2017-09-20 DIAGNOSIS — E782 Mixed hyperlipidemia: Secondary | ICD-10-CM | POA: Diagnosis present

## 2017-09-20 DIAGNOSIS — T502X5A Adverse effect of carbonic-anhydrase inhibitors, benzothiadiazides and other diuretics, initial encounter: Secondary | ICD-10-CM | POA: Diagnosis present

## 2017-09-20 DIAGNOSIS — G894 Chronic pain syndrome: Secondary | ICD-10-CM | POA: Diagnosis not present

## 2017-09-20 DIAGNOSIS — G8929 Other chronic pain: Secondary | ICD-10-CM | POA: Diagnosis present

## 2017-09-20 DIAGNOSIS — I8390 Asymptomatic varicose veins of unspecified lower extremity: Secondary | ICD-10-CM | POA: Diagnosis present

## 2017-09-20 DIAGNOSIS — R609 Edema, unspecified: Secondary | ICD-10-CM

## 2017-09-20 DIAGNOSIS — J449 Chronic obstructive pulmonary disease, unspecified: Secondary | ICD-10-CM | POA: Diagnosis present

## 2017-09-20 DIAGNOSIS — L03119 Cellulitis of unspecified part of limb: Secondary | ICD-10-CM | POA: Insufficient documentation

## 2017-09-20 DIAGNOSIS — I872 Venous insufficiency (chronic) (peripheral): Secondary | ICD-10-CM | POA: Diagnosis not present

## 2017-09-20 DIAGNOSIS — L02419 Cutaneous abscess of limb, unspecified: Secondary | ICD-10-CM | POA: Diagnosis not present

## 2017-09-20 DIAGNOSIS — Z833 Family history of diabetes mellitus: Secondary | ICD-10-CM

## 2017-09-20 DIAGNOSIS — N184 Chronic kidney disease, stage 4 (severe): Secondary | ICD-10-CM | POA: Diagnosis present

## 2017-09-20 DIAGNOSIS — L03115 Cellulitis of right lower limb: Secondary | ICD-10-CM | POA: Diagnosis present

## 2017-09-20 DIAGNOSIS — Z8249 Family history of ischemic heart disease and other diseases of the circulatory system: Secondary | ICD-10-CM

## 2017-09-20 DIAGNOSIS — I129 Hypertensive chronic kidney disease with stage 1 through stage 4 chronic kidney disease, or unspecified chronic kidney disease: Secondary | ICD-10-CM | POA: Diagnosis present

## 2017-09-20 DIAGNOSIS — Z794 Long term (current) use of insulin: Secondary | ICD-10-CM

## 2017-09-20 DIAGNOSIS — Z7982 Long term (current) use of aspirin: Secondary | ICD-10-CM

## 2017-09-20 DIAGNOSIS — E1122 Type 2 diabetes mellitus with diabetic chronic kidney disease: Secondary | ICD-10-CM | POA: Diagnosis present

## 2017-09-20 DIAGNOSIS — G4700 Insomnia, unspecified: Secondary | ICD-10-CM | POA: Diagnosis present

## 2017-09-20 DIAGNOSIS — M7989 Other specified soft tissue disorders: Secondary | ICD-10-CM | POA: Diagnosis present

## 2017-09-20 DIAGNOSIS — E1151 Type 2 diabetes mellitus with diabetic peripheral angiopathy without gangrene: Secondary | ICD-10-CM | POA: Diagnosis present

## 2017-09-20 DIAGNOSIS — I5032 Chronic diastolic (congestive) heart failure: Secondary | ICD-10-CM | POA: Diagnosis not present

## 2017-09-20 DIAGNOSIS — E119 Type 2 diabetes mellitus without complications: Secondary | ICD-10-CM

## 2017-09-20 DIAGNOSIS — E876 Hypokalemia: Secondary | ICD-10-CM | POA: Diagnosis present

## 2017-09-20 DIAGNOSIS — D631 Anemia in chronic kidney disease: Secondary | ICD-10-CM | POA: Diagnosis present

## 2017-09-20 DIAGNOSIS — L03116 Cellulitis of left lower limb: Secondary | ICD-10-CM | POA: Diagnosis present

## 2017-09-20 DIAGNOSIS — Z8349 Family history of other endocrine, nutritional and metabolic diseases: Secondary | ICD-10-CM

## 2017-09-20 DIAGNOSIS — M109 Gout, unspecified: Secondary | ICD-10-CM | POA: Diagnosis present

## 2017-09-20 DIAGNOSIS — L0390 Cellulitis, unspecified: Secondary | ICD-10-CM | POA: Diagnosis present

## 2017-09-20 DIAGNOSIS — IMO0001 Reserved for inherently not codable concepts without codable children: Secondary | ICD-10-CM

## 2017-09-20 DIAGNOSIS — R600 Localized edema: Secondary | ICD-10-CM

## 2017-09-20 DIAGNOSIS — Z951 Presence of aortocoronary bypass graft: Secondary | ICD-10-CM

## 2017-09-20 DIAGNOSIS — Z79899 Other long term (current) drug therapy: Secondary | ICD-10-CM

## 2017-09-20 DIAGNOSIS — E1142 Type 2 diabetes mellitus with diabetic polyneuropathy: Secondary | ICD-10-CM | POA: Diagnosis present

## 2017-09-20 DIAGNOSIS — Z8673 Personal history of transient ischemic attack (TIA), and cerebral infarction without residual deficits: Secondary | ICD-10-CM | POA: Diagnosis not present

## 2017-09-20 LAB — COMPREHENSIVE METABOLIC PANEL
ALK PHOS: 85 U/L (ref 38–126)
ALT: 22 U/L (ref 17–63)
AST: 26 U/L (ref 15–41)
Albumin: 3.6 g/dL (ref 3.5–5.0)
Anion gap: 8 (ref 5–15)
BILIRUBIN TOTAL: 1.2 mg/dL (ref 0.3–1.2)
BUN: 25 mg/dL — AB (ref 6–20)
CALCIUM: 9.1 mg/dL (ref 8.9–10.3)
CO2: 27 mmol/L (ref 22–32)
CREATININE: 2.02 mg/dL — AB (ref 0.61–1.24)
Chloride: 100 mmol/L — ABNORMAL LOW (ref 101–111)
GFR, EST AFRICAN AMERICAN: 36 mL/min — AB (ref >60)
GFR, EST NON AFRICAN AMERICAN: 31 mL/min — AB (ref >60)
Glucose, Bld: 201 mg/dL — ABNORMAL HIGH (ref 65–99)
Potassium: 3.5 mmol/L (ref 3.5–5.1)
Sodium: 135 mmol/L (ref 135–145)
TOTAL PROTEIN: 7.5 g/dL (ref 6.5–8.1)

## 2017-09-20 LAB — CBC WITH DIFFERENTIAL/PLATELET
BASOS ABS: 0 10*3/uL (ref 0–0.1)
BASOS PCT: 0 %
EOS ABS: 0.2 10*3/uL (ref 0–0.7)
EOS PCT: 4 %
HCT: 41.2 % (ref 40.0–52.0)
HEMOGLOBIN: 14 g/dL (ref 13.0–18.0)
Lymphocytes Relative: 12 %
Lymphs Abs: 0.8 10*3/uL — ABNORMAL LOW (ref 1.0–3.6)
MCH: 32.4 pg (ref 26.0–34.0)
MCHC: 34 g/dL (ref 32.0–36.0)
MCV: 95.1 fL (ref 80.0–100.0)
Monocytes Absolute: 0.5 10*3/uL (ref 0.2–1.0)
Monocytes Relative: 7 %
NEUTROS PCT: 77 %
Neutro Abs: 5.1 10*3/uL (ref 1.4–6.5)
PLATELETS: 128 10*3/uL — AB (ref 150–440)
RBC: 4.33 MIL/uL — AB (ref 4.40–5.90)
RDW: 13.8 % (ref 11.5–14.5)
WBC: 6.6 10*3/uL (ref 3.8–10.6)

## 2017-09-20 LAB — HEMOGLOBIN A1C
HEMOGLOBIN A1C: 7.6 % — AB (ref 4.8–5.6)
MEAN PLASMA GLUCOSE: 171.42 mg/dL

## 2017-09-20 LAB — GLUCOSE, CAPILLARY: Glucose-Capillary: 147 mg/dL — ABNORMAL HIGH (ref 65–99)

## 2017-09-20 MED ORDER — INSULIN ASPART 100 UNIT/ML ~~LOC~~ SOLN
0.0000 [IU] | Freq: Three times a day (TID) | SUBCUTANEOUS | Status: DC
  Administered 2017-09-21 – 2017-09-22 (×2): 2 [IU] via SUBCUTANEOUS
  Administered 2017-09-22: 3 [IU] via SUBCUTANEOUS
  Administered 2017-09-23 (×2): 2 [IU] via SUBCUTANEOUS
  Filled 2017-09-20 (×4): qty 1

## 2017-09-20 MED ORDER — INDAPAMIDE 2.5 MG PO TABS
2.5000 mg | ORAL_TABLET | Freq: Every day | ORAL | Status: DC
  Administered 2017-09-21 – 2017-09-23 (×3): 2.5 mg via ORAL
  Filled 2017-09-20 (×3): qty 1

## 2017-09-20 MED ORDER — OXYCODONE HCL 5 MG PO TABS
10.0000 mg | ORAL_TABLET | Freq: Three times a day (TID) | ORAL | Status: DC | PRN
  Administered 2017-09-21 (×3): 10 mg via ORAL
  Filled 2017-09-20 (×3): qty 2

## 2017-09-20 MED ORDER — ZOLPIDEM TARTRATE 5 MG PO TABS
5.0000 mg | ORAL_TABLET | Freq: Every evening | ORAL | Status: DC | PRN
  Administered 2017-09-20 – 2017-09-23 (×4): 5 mg via ORAL
  Filled 2017-09-20 (×4): qty 1

## 2017-09-20 MED ORDER — ADULT MULTIVITAMIN W/MINERALS CH
1.0000 | ORAL_TABLET | Freq: Every day | ORAL | Status: DC
  Administered 2017-09-21 – 2017-09-23 (×3): 1 via ORAL
  Filled 2017-09-20 (×3): qty 1

## 2017-09-20 MED ORDER — INSULIN ASPART 100 UNIT/ML ~~LOC~~ SOLN: 0.0000 [IU] | Freq: Every day | SUBCUTANEOUS | Status: DC

## 2017-09-20 MED ORDER — SODIUM CHLORIDE 0.9 % IV SOLN
3.0000 g | Freq: Four times a day (QID) | INTRAVENOUS | Status: DC
  Administered 2017-09-20 – 2017-09-21 (×2): 3 g via INTRAVENOUS
  Filled 2017-09-20 (×5): qty 3

## 2017-09-20 MED ORDER — ASPIRIN EC 325 MG PO TBEC
325.0000 mg | DELAYED_RELEASE_TABLET | Freq: Every day | ORAL | Status: DC
  Administered 2017-09-21 – 2017-09-23 (×3): 325 mg via ORAL
  Filled 2017-09-20 (×3): qty 1

## 2017-09-20 MED ORDER — SENNOSIDES-DOCUSATE SODIUM 8.6-50 MG PO TABS
1.0000 | ORAL_TABLET | Freq: Two times a day (BID) | ORAL | Status: DC
  Administered 2017-09-20 – 2017-09-23 (×6): 1 via ORAL
  Filled 2017-09-20 (×6): qty 1

## 2017-09-20 MED ORDER — VANCOMYCIN HCL IN DEXTROSE 1-5 GM/200ML-% IV SOLN
1000.0000 mg | Freq: Once | INTRAVENOUS | Status: AC
Start: 2017-09-20 — End: 2017-09-20
  Administered 2017-09-20: 1000 mg via INTRAVENOUS
  Filled 2017-09-20: qty 200

## 2017-09-20 MED ORDER — SODIUM CHLORIDE 0.9 % IV SOLN
1500.0000 mg | INTRAVENOUS | Status: DC
  Administered 2017-09-21 – 2017-09-22 (×3): 1500 mg via INTRAVENOUS
  Filled 2017-09-20 (×4): qty 1500

## 2017-09-20 MED ORDER — FENTANYL 50 MCG/HR TD PT72: 50.0000 ug | MEDICATED_PATCH | TRANSDERMAL | 0 refills | Status: DC

## 2017-09-20 MED ORDER — HYDRALAZINE HCL 25 MG PO TABS
25.0000 mg | ORAL_TABLET | Freq: Three times a day (TID) | ORAL | Status: DC
  Administered 2017-09-20 – 2017-09-23 (×5): 25 mg via ORAL
  Filled 2017-09-20 (×8): qty 1

## 2017-09-20 MED ORDER — LIRAGLUTIDE 18 MG/3ML ~~LOC~~ SOPN: 1.8000 mg | PEN_INJECTOR | Freq: Every day | SUBCUTANEOUS | Status: DC

## 2017-09-20 MED ORDER — PENTOXIFYLLINE ER 400 MG PO TBCR
400.0000 mg | EXTENDED_RELEASE_TABLET | Freq: Three times a day (TID) | ORAL | Status: DC
  Administered 2017-09-20 – 2017-09-23 (×8): 400 mg via ORAL
  Filled 2017-09-20 (×10): qty 1

## 2017-09-20 MED ORDER — GABAPENTIN 400 MG PO CAPS
400.0000 mg | ORAL_CAPSULE | Freq: Three times a day (TID) | ORAL | Status: DC
  Administered 2017-09-20 – 2017-09-23 (×8): 400 mg via ORAL
  Filled 2017-09-20 (×8): qty 1

## 2017-09-20 MED ORDER — FUROSEMIDE 10 MG/ML IJ SOLN
40.0000 mg | Freq: Two times a day (BID) | INTRAMUSCULAR | Status: DC
  Administered 2017-09-20: 40 mg via INTRAVENOUS
  Filled 2017-09-20: qty 4

## 2017-09-20 MED ORDER — FENTANYL 50 MCG/HR TD PT72
50.0000 ug | MEDICATED_PATCH | TRANSDERMAL | Status: DC
Start: 2017-09-20 — End: 2017-09-23
  Administered 2017-09-20: 50 ug via TRANSDERMAL
  Filled 2017-09-20: qty 1

## 2017-09-20 MED ORDER — CARVEDILOL 25 MG PO TABS
25.0000 mg | ORAL_TABLET | Freq: Two times a day (BID) | ORAL | Status: DC
  Administered 2017-09-21 – 2017-09-23 (×5): 25 mg via ORAL
  Filled 2017-09-20 (×5): qty 1

## 2017-09-20 MED ORDER — IPRATROPIUM-ALBUTEROL 0.5-2.5 (3) MG/3ML IN SOLN: 3.0000 mL | RESPIRATORY_TRACT | Status: DC | PRN

## 2017-09-20 MED ORDER — FEBUXOSTAT 40 MG PO TABS
40.0000 mg | ORAL_TABLET | Freq: Every day | ORAL | Status: DC
  Administered 2017-09-21 – 2017-09-23 (×3): 40 mg via ORAL
  Filled 2017-09-20 (×3): qty 1

## 2017-09-20 MED ORDER — OXYCODONE HCL 10 MG PO TABS: 10.0000 mg | ORAL_TABLET | Freq: Three times a day (TID) | ORAL | 0 refills | Status: DC | PRN

## 2017-09-20 MED ORDER — SODIUM CHLORIDE 0.9 % IV SOLN
Freq: Once | INTRAVENOUS | Status: AC
  Administered 2017-09-20: 18:00:00 via INTRAVENOUS

## 2017-09-20 MED ORDER — BASAGLAR KWIKPEN 100 UNIT/ML ~~LOC~~ SOPN: 30.0000 [IU] | PEN_INJECTOR | Freq: Every day | SUBCUTANEOUS | Status: DC

## 2017-09-20 MED ORDER — HEPARIN SODIUM (PORCINE) 5000 UNIT/ML IJ SOLN
5000.0000 [IU] | Freq: Three times a day (TID) | INTRAMUSCULAR | Status: DC
  Administered 2017-09-20 – 2017-09-23 (×8): 5000 [IU] via SUBCUTANEOUS
  Filled 2017-09-20 (×8): qty 1

## 2017-09-20 MED ORDER — ATORVASTATIN CALCIUM 20 MG PO TABS
40.0000 mg | ORAL_TABLET | Freq: Every day | ORAL | Status: DC
  Administered 2017-09-20 – 2017-09-22 (×3): 40 mg via ORAL
  Filled 2017-09-20 (×3): qty 2

## 2017-09-20 MED ORDER — COLCHICINE 0.6 MG PO TABS
0.6000 mg | ORAL_TABLET | Freq: Every day | ORAL | Status: DC
  Administered 2017-09-21 – 2017-09-23 (×3): 0.6 mg via ORAL
  Filled 2017-09-20 (×3): qty 1

## 2017-09-20 MED ORDER — PANTOPRAZOLE SODIUM 40 MG PO TBEC
40.0000 mg | DELAYED_RELEASE_TABLET | Freq: Every day | ORAL | Status: DC
  Administered 2017-09-21 – 2017-09-23 (×3): 40 mg via ORAL
  Filled 2017-09-20 (×3): qty 1

## 2017-09-20 MED ORDER — FUROSEMIDE 10 MG/ML IJ SOLN
40.0000 mg | Freq: Every day | INTRAMUSCULAR | Status: DC
  Administered 2017-09-21 – 2017-09-22 (×2): 40 mg via INTRAVENOUS
  Filled 2017-09-20 (×2): qty 4

## 2017-09-20 MED ORDER — SODIUM CHLORIDE 0.9 % IV SOLN
INTRAVENOUS | Status: DC | PRN
  Administered 2017-09-20: via INTRAVENOUS

## 2017-09-20 MED ORDER — PIPERACILLIN-TAZOBACTAM 3.375 G IVPB 30 MIN
3.3750 g | Freq: Once | INTRAVENOUS | Status: AC
  Administered 2017-09-20: 3.375 g via INTRAVENOUS
  Filled 2017-09-20: qty 50

## 2017-09-20 MED ORDER — INSULIN GLARGINE 100 UNIT/ML ~~LOC~~ SOLN
30.0000 [IU] | Freq: Every day | SUBCUTANEOUS | Status: DC
  Administered 2017-09-20 – 2017-09-22 (×2): 30 [IU] via SUBCUTANEOUS
  Filled 2017-09-20 (×4): qty 0.3

## 2017-09-20 NOTE — Consult Note (Signed)
Pharmacy Antibiotic Note  Adam Merritt is a 72 y.o. male admitted on 09/20/2017 with cellulitis.  Pharmacy has been consulted for unasyn and vancomycin dosing.  Plan: Vancomycin 1g once in ED. Will give next dose in 6 hours for stacked dosing Vancomycin 1500 IV every 24 hours.  Goal trough 10-15 mcg/mL.  Trough prior to 5th total dose unasyn 3g q 6 hours  Height: 5\' 6"  (167.6 cm) Weight: 300 lb (136.1 kg) IBW/kg (Calculated) : 63.8  Temp (24hrs), Avg:98.1 F (36.7 C), Min:98.1 F (36.7 C), Max:98.1 F (36.7 C)  Recent Labs  Lab 09/20/17 1226  WBC 6.6  CREATININE 2.02*    Estimated Creatinine Clearance: 42.7 mL/min (A) (by C-G formula based on SCr of 2.02 mg/dL (H)).    No Known Allergies  Antimicrobials this admission: zosyn 4/18 >> one dose unasyn 4/18 >>  Vancomycin 4/18>>  Dose adjustments this admission:  Microbiology results:   Thank you for allowing pharmacy to be a part of this patient's care.  Ramond Dial, Pharm.D, BCPS Clinical Pharmacist  09/20/2017 4:38 PM

## 2017-09-20 NOTE — ED Notes (Signed)
Pt sleeping at this time. Pt in NAD.

## 2017-09-20 NOTE — ED Notes (Signed)
Nurse called dietary about a tray for pt.

## 2017-09-20 NOTE — ED Notes (Signed)
Patient transported to [place] 

## 2017-09-20 NOTE — Telephone Encounter (Signed)
Per [name] patient was a direct admitted via private transportation to the ER [facility]) for severe edema bilateral lower extremities for cellulitis; spoke with Phillis Haggis ER. [place]

## 2017-09-20 NOTE — H&P (Signed)
[place] [place] NAME: Adam Merritt    MR#:  [id number]  DATE OF BIRTH:  [birth year]  DATE OF ADMISSION:  09/20/2017  PRIMARY CARE PHYSICIAN: [name], MD   REQUESTING/REFERRING PHYSICIAN: Dr. Conni Slipper  CHIEF COMPLAINT:   Chief Complaint  Patient presents with  . Cellulitis    HISTORY OF PRESENT ILLNESS:  Adam Merritt  is a 72 y.o. 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 Patient was in the hospital recently Secondary to acute renal failure and electrolyte abnormalities.  He was given IV fluids and discharged home.  He states that he always has both lower extremity edema secondary to venous insufficiency since his bypass surgery.  His left leg has some erythema but for the last couple of days he has noticed worsening of his swelling with increased erythema and weeping blisters.  Denies any fevers but had chills at home and complaining of weakness.  He ambulates with a walker at baseline.  He also had gout confirmed with arthrocentesis and get a gout crystals on aspiration last admission requiring a steroid injection.  His labs show acute renal failure again.  Dopplers are negative for DVT.  He is being admitted for bilateral lower extremity cellulitis.  PAST MEDICAL HISTORY:   Past Medical History:  Diagnosis Date  . Anemia   . CAD (coronary artery disease)   . Chest pain   . Coronary artery disease   . Diabetes mellitus without complication ([place])   . Esophageal reflux   . Herpes zoster without mention of complication   . Hyperlipidemia   . Hypertension   . Insomnia   . Lumbago   . Neuropathy in diabetes ([place])   . Obstructive chronic bronchitis without exacerbation ([place])   . Other malaise and fatigue   .  Stroke ([place])   . Varicose veins     PAST SURGICAL HISTORY:   Past Surgical History:  Procedure Laterality Date  . CHOLECYSTECTOMY    . CORONARY ARTERY BYPASS GRAFT      SOCIAL HISTORY:   Social History   Tobacco Use  . Smoking status: Never Smoker  . Smokeless tobacco: Never Used  Substance Use Topics  . Alcohol use: No    FAMILY HISTORY:   Family History  Problem Relation Age of Onset  . Diabetes Mother   . Hyperlipidemia Mother   . Hypertension Mother   . Diabetes Father   . Hyperlipidemia Father   . Hypertension Father     DRUG ALLERGIES:  No Known Allergies  REVIEW OF SYSTEMS:   Review of Systems  Constitutional: Positive for malaise/fatigue. Negative for chills, fever and weight loss.  HENT: Negative for ear discharge, ear pain, hearing loss, nosebleeds and tinnitus.   Eyes: Negative for blurred vision, double vision and photophobia.  Respiratory: Negative for cough, hemoptysis, shortness of breath and wheezing.   Cardiovascular: Positive for leg swelling. Negative for chest pain, palpitations and orthopnea.  Gastrointestinal: Negative for abdominal pain, constipation, diarrhea, heartburn, melena, nausea and vomiting.  Genitourinary: Negative for dysuria, frequency, hematuria and urgency.  Musculoskeletal: Positive for myalgias. Negative for back pain and neck pain.  Skin: Positive for rash.  Neurological: Negative for dizziness, tingling, tremors, sensory change, speech change, focal weakness and headaches.  Endo/Heme/Allergies: Does  not bruise/bleed easily.  Psychiatric/Behavioral: Negative for depression.    MEDICATIONS AT HOME:   Prior to Admission medications   Medication Sig Start Date End Date Taking? Authorizing Provider  Oxycodone HCl 10 MG TABS Take 1 tablet (10 mg total) by mouth 3 (three) times daily as needed. 09/20/17  Yes Boscia, Greer Ee, NP  albuterol (PROVENTIL HFA;VENTOLIN HFA) 108 (90 Base) MCG/ACT inhaler Inhale 1 puff into the  lungs every 6 (six) hours as needed for wheezing or shortness of breath. 09/10/17   [name], MD  aspirin 325 MG EC tablet Take 325 mg by mouth daily.    [provider]  atorvastatin (LIPITOR) 40 MG tablet Take 40 mg by mouth at bedtime.     [provider]  carvedilol (COREG) 25 MG tablet Take 25 mg by mouth 2 (two) times daily with a meal.    [provider]  clobetasol ointment (TEMOVATE) 0.05 % Apply to all affected areas on lower legs and feet bid 08/13/17   Ronnell Freshwater, NP  colchicine 0.6 MG tablet Take 1 tablet (0.6 mg total) by mouth daily. 08/31/17 08/31/18  Dustin Flock, MD  febuxostat (ULORIC) 40 MG tablet Take one tab a day for gout, cannot take allopurinol 09/04/17   [name], MD  fentaNYL (DURAGESIC - DOSED MCG/HR) 50 MCG/HR Place 1 patch (50 mcg total) onto the skin every 3 (three) days. 09/20/17   Ronnell Freshwater, NP  gabapentin (NEURONTIN) 400 MG capsule Take 400 mg by mouth 3 (three) times daily.     [provider]  hydrALAZINE (APRESOLINE) 25 MG tablet Take 25 mg by mouth 3 (three) times daily.    [provider]  ibuprofen (ADVIL,MOTRIN) 600 MG tablet Take 600 mg by mouth 2 (two) times daily as needed.    [provider]  indapamide (LOZOL) 2.5 MG tablet Take 1 tablet (2.5 mg total) by mouth daily. 09/11/17   [name], MD  Insulin Glargine [facility]) 100 UNIT/ML SOPN Inject 30 Units into the skin at bedtime.    [provider]  ipratropium-albuterol (DUONEB) 0.5-2.5 (3) MG/3ML SOLN Take 3 mLs by nebulization every 4 (four) hours as needed (for shortness of breath). 09/10/17   [name], MD  Liraglutide (VICTOZA) 18 MG/3ML SOPN Inject 1.8 mg into the skin daily.     [provider]  Multiple Vitamin (MULTIVITAMIN WITH MINERALS) TABS tablet Take 1 tablet by mouth daily.    [provider]  omeprazole (PRILOSEC) 20 MG capsule Take 20 mg by mouth daily.    [provider]  ondansetron (ZOFRAN) 8 MG tablet Take 1 tablet (8 mg total) by mouth every 8 (eight) hours as needed for nausea or vomiting. 07/09/17   Ronnell Freshwater, NP  pentoxifylline (TRENTAL) 400 MG CR tablet Take 400 mg by mouth 3 (three) times daily.    [provider]  sennosides-docusate sodium (SENOKOT-S) 8.6-50 MG tablet Take 1-2 tablets by mouth 2 (two) times daily.    [provider]  UREA 20 INTENSIVE HYDRATING 20 % cream USE AS DIRECTED 3 TIMES A WEEK 09/12/17   [name], MD  zolpidem (AMBIEN CR) 12.5 MG CR tablet Take 1 tablet (12.5 mg total) by mouth at bedtime as needed for sleep. 06/29/17   Ronnell Freshwater, NP      VITAL SIGNS:  Blood pressure 138/78, pulse 89, temperature 98.1 F (36.7 C), temperature source Oral, resp. rate 16, height  5\' 6"  (1.676 m), weight 136.1 kg (300 lb), SpO2 100 %.  PHYSICAL EXAMINATION:   Physical Exam  GENERAL:  72 y.o.-year-old obese patient lying in the bed with no acute distress.  EYES: Pupils equal, round, reactive to light and accommodation. No scleral icterus. Extraocular muscles intact.  HEENT: Head atraumatic, normocephalic. Oropharynx and nasopharynx clear.  NECK:  Supple, no jugular venous distention. No thyroid enlargement, no tenderness.  LUNGS: Normal breath sounds bilaterally, no wheezing, rales,rhonchi or crepitation. No use of accessory muscles of respiration. Decreased bibasilar breath sounds CARDIOVASCULAR: S1, S2 normal. No murmurs, rubs, or gallops.  ABDOMEN: Soft, nontender, nondistended. Bowel sounds present. No organomegaly or mass.  EXTREMITIES: 4+ lateral lower extremity edema with erythema and weeping blisters noted on both lower extremities. no  cyanosis, or clubbing.  NEUROLOGIC: Cranial nerves II through XII are intact. Muscle strength 5/5 in all extremities. Sensation intact. Gait not checked. Global weakness PSYCHIATRIC: The patient is alert and oriented x 3.  SKIN: No obvious rash, lesion, or ulcer.     LABORATORY PANEL:   CBC Recent Labs  Lab 09/20/17 1226  WBC 6.6  HGB 14.0  HCT 41.2  PLT 128*   ------------------------------------------------------------------------------------------------------------------  Chemistries  Recent Labs  Lab 09/20/17 1226  NA 135  K 3.5  CL 100*  CO2 27  GLUCOSE 201*  BUN 25*  CREATININE 2.02*  CALCIUM 9.1  AST 26  ALT 22  ALKPHOS 85  BILITOT 1.2   ------------------------------------------------------------------------------------------------------------------  Cardiac Enzymes No results for input(s): TROPONINI in the last 168 hours. ------------------------------------------------------------------------------------------------------------------  RADIOLOGY:  US Venous Img Lower Bilateral  Result Date: 09/20/2017 CLINICAL DATA:  Redness and swelling in lower extremities. Possible cellulitis. Rule out DVT. EXAM: BILATERAL LOWER EXTREMITY VENOUS DOPPLER ULTRASOUND TECHNIQUE: Gray-scale sonography with graded compression, as well as color Doppler and duplex ultrasound were performed to evaluate the lower extremity deep venous systems from the level of the common femoral vein and including the common femoral, femoral, profunda femoral, popliteal and calf veins including the posterior tibial, peroneal and gastrocnemius veins when visible. The superficial great saphenous vein was also interrogated. Spectral Doppler was utilized to evaluate flow at rest and with distal augmentation maneuvers in the common femoral, femoral and popliteal veins. COMPARISON:  10/11/2013 FINDINGS: RIGHT LOWER EXTREMITY Common Femoral Vein: No evidence of thrombus. Normal compressibility, respiratory phasicity and response to augmentation. Saphenofemoral Junction: No evidence of thrombus. Normal compressibility and flow on color Doppler imaging. Profunda Femoral Vein: No evidence of thrombus. Normal compressibility and flow on color Doppler imaging. Femoral Vein: No  evidence of thrombus. Normal compressibility, respiratory phasicity and response to augmentation. Popliteal Vein: No evidence of thrombus. Normal compressibility, respiratory phasicity and response to augmentation. Calf Veins: Calf veins not visualized. LEFT LOWER EXTREMITY Common Femoral Vein: No evidence of thrombus. Normal compressibility, respiratory phasicity and response to augmentation. Saphenofemoral Junction: No evidence of thrombus. Normal compressibility and flow on color Doppler imaging. Profunda Femoral Vein: No evidence of thrombus. Normal compressibility and flow on color Doppler imaging. Femoral Vein: No evidence of thrombus. Normal compressibility, respiratory phasicity and response to augmentation. Popliteal Vein: No evidence of thrombus. Normal compressibility, respiratory phasicity and response to augmentation. Calf Veins: Calf veins not visualized. Other Findings:  Subcutaneous edema. IMPRESSION: No evidence of deep venous thrombosis in the lower extremities. However, calf veins could not be evaluated due to body habitus and edema. Electronically Signed   By: Markus Daft M.D.   On: 09/20/2017 16:26    EKG:  Orders placed or performed during the hospital encounter of 08/29/17  . ED EKG  . ED EKG  . EKG 12-Lead  . EKG 12-Lead    IMPRESSION AND PLAN:   Rodrick Payson  is a 72 y.o. 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  1.  Bilateral lower extremity cellulitis- -Started on vancomycin and Unasyn -Lasix for his edema -Wound care consult for dressing changes -Has chronic venous insufficiency.  He can follow-up with vascular as outpatient  2.  Acute renal failure on CKD-has CKD stage IV with baseline creatinine around 1.5.  Received fluids during last admission 2 weeks  ago. -Increased fluid retention now.  Will start IV Lasix.  Monitor carefully -If worsens, consult nephrology for fluid balance -Strict input and output monitoring  3.  Diabetes mellitus-continue Lantus, Victoza. -Sliding scale insulin ordered  4.  CAD-stable.  Continue cardiac medications  5.  Chronic pain-continue fentanyl patch and as needed pain medications.  6.  DVT prophylaxis-subcutaneous heparin    All the records are reviewed and case discussed with ED provider. Management plans discussed with the patient, family and they are in agreement.  CODE STATUS: Full Code  TOTAL TIME TAKING CARE OF THIS PATIENT: 50 minutes.    Gladstone Lighter M.D on 09/20/2017 at 4:57 PM  Between 7am to 6pm - Pager - [phone]  After 6pm go to [email or web address] - password [email or web address] [place] [facility]  Office  [phone]  CC: Primary care physician; [name], MD

## 2017-09-20 NOTE — Progress Notes (Signed)
[facility] [place], [place] [address]  Internal MEDICINE  Office Visit Note  Patient Name: Adam Merritt  [id number]  [id number]  Date of Service: 10/10/2017  Chief Complaint  Patient presents with  . Recurrent Skin Infections    leaking fluids and seem to have leaked more this morning. having pain form scale 1-10 complaining of pain being a 6 right now, the past few days its been a 10. wife stated that blood sugar was 120 this morning.    The patient has been having excess swelling in both lower legs for past few days. Swelling has become so severe that fluid is constantly leaking from the skin. Legs are very tender and warm. Fluid is clear. He states that he feels bad in general. Feels tired and weak. Denies fever or chills. Does not have nausea, vomiting, or idarrhea.    Pt is here for routine follow up.    Current Medication: Outpatient Encounter Medications as of 09/20/2017  Medication Sig Note  . albuterol (PROVENTIL HFA;VENTOLIN HFA) 108 (90 Base) MCG/ACT inhaler Inhale 1 puff into the lungs every 6 (six) hours as needed for wheezing or shortness of breath.   Marland Kitchen aspirin 325 MG EC tablet Take 325 mg by mouth daily.   Marland Kitchen atorvastatin (LIPITOR) 40 MG tablet Take 40 mg by mouth at bedtime.    . carvedilol (COREG) 25 MG tablet Take 25 mg by mouth 2 (two) times daily with a meal.   . colchicine 0.6 MG tablet Take 1 tablet (0.6 mg total) by mouth daily.   . febuxostat (ULORIC) 40 MG tablet Take one tab a day for gout, cannot take allopurinol (Patient taking differently: Take 40 mg by mouth daily. )   . fentaNYL (DURAGESIC - DOSED MCG/HR) 50 MCG/HR Place 1 patch (50 mcg total) onto the skin every 3 (three) days. 09/20/2017: Patient currently NOT wearing patch. Patient's wife has a patch  . gabapentin (NEURONTIN) 400 MG capsule Take 400 mg by mouth 3 (three) times daily.    . hydrALAZINE (APRESOLINE) 25 MG tablet Take 25 mg by mouth 3 (three) times daily.   Marland Kitchen  ibuprofen (ADVIL,MOTRIN) 600 MG tablet Take 600 mg by mouth 2 (two) times daily as needed for mild pain or moderate pain.    . indapamide (LOZOL) 2.5 MG tablet Take 1 tablet (2.5 mg total) by mouth daily.   . Insulin Glargine (BASAGLAR KWIKPEN) 100 UNIT/ML SOPN Inject 30 Units into the skin at bedtime.   Marland Kitchen ipratropium-albuterol (DUONEB) 0.5-2.5 (3) MG/3ML SOLN Take 3 mLs by nebulization every 4 (four) hours as needed (for shortness of breath).   . Liraglutide (VICTOZA) 18 MG/3ML SOPN Inject 1.8 mg into the skin daily.    . Multiple Vitamin (MULTIVITAMIN WITH MINERALS) TABS tablet Take 1 tablet by mouth daily.   Marland Kitchen omeprazole (PRILOSEC) 20 MG capsule Take 20 mg by mouth daily.   . Oxycodone HCl 10 MG TABS Take 1 tablet (10 mg total) by mouth 3 (three) times daily as needed.   . pentoxifylline (TRENTAL) 400 MG CR tablet Take 400 mg by mouth 3 (three) times daily.   . sennosides-docusate sodium (SENOKOT-S) 8.6-50 MG tablet Take 1-2 tablets by mouth 2 (two) times daily.   Marland Kitchen UREA 20 INTENSIVE HYDRATING 20 % cream USE AS DIRECTED 3 TIMES A WEEK   . zolpidem (AMBIEN CR) 12.5 MG CR tablet Take 1 tablet (12.5 mg total) by mouth at bedtime as needed for sleep.   . [  DISCONTINUED] clobetasol ointment (TEMOVATE) 0.05 % Apply to all affected areas on lower legs and feet bid (Patient not taking: Reported on 09/20/2017)   . [DISCONTINUED] fentaNYL (DURAGESIC - DOSED MCG/HR) 50 MCG/HR Place 1 patch (50 mcg total) onto the skin every 3 (three) days.   . [DISCONTINUED] levofloxacin (LEVAQUIN) 500 MG tablet Take 1 tablet (500 mg total) by mouth daily.   . [DISCONTINUED] ondansetron (ZOFRAN) 8 MG tablet Take 1 tablet (8 mg total) by mouth every 8 (eight) hours as needed for nausea or vomiting. (Patient not taking: Reported on 09/20/2017)   . [DISCONTINUED] Oxycodone HCl 10 MG TABS Take 1 tablet (10 mg total) by mouth 3 (three) times daily as needed.    No facility-administered encounter medications on file as of  09/20/2017.     Surgical History: Past Surgical History:  Procedure Laterality Date  . CHOLECYSTECTOMY    . CORONARY ARTERY BYPASS GRAFT      Medical History: Past Medical History:  Diagnosis Date  . Anemia   . CAD (coronary artery disease)   . Chest pain   . Coronary artery disease   . Diabetes mellitus without complication ([place])   . Esophageal reflux   . Herpes zoster without mention of complication   . Hyperlipidemia   . Hypertension   . Insomnia   . Lumbago   . Neuropathy in diabetes ([place])   . Obstructive chronic bronchitis without exacerbation ([place])   . Other malaise and fatigue   . Stroke ([place])   . Varicose veins     Family History: Family History  Problem Relation Age of Onset  . Diabetes Mother   . Hyperlipidemia Mother   . Hypertension Mother   . Diabetes Father   . Hyperlipidemia Father   . Hypertension Father     Social History   Socioeconomic History  . Marital status: Married    Spouse name: Not on file  . Number of children: Not on file  . Years of education: Not on file  . Highest education level: Not on file  Occupational History  . Not on file  Social Needs  . Financial resource strain: Not on file  . Food insecurity:    Worry: Not on file    Inability: Not on file  . Transportation needs:    Medical: Not on file    Non-medical: Not on file  Tobacco Use  . Smoking status: Never Smoker  . Smokeless tobacco: Never Used  Substance and Sexual Activity  . Alcohol use: No  . Drug use: No  . Sexual activity: Not on file  Lifestyle  . Physical activity:    Days per week: Not on file    Minutes per session: Not on file  . Stress: Not on file  Relationships  . Social connections:    Talks on phone: Not on file    Gets together: Not on file    Attends religious service: Not on file    Active member of club or organization: Not on file    Attends meetings of clubs or organizations: Not on file    Relationship status: Not on file  .  Intimate partner violence:    Fear of current or ex partner: Not on file    Emotionally abused: Not on file    Physically abused: Not on file    Forced sexual activity: Not on file  Other Topics Concern  . Not on file  Social History Narrative  . Not on file  Review of Systems  Constitutional: Positive for activity change and fatigue. Negative for chills and unexpected weight change.  HENT: Negative for congestion, postnasal drip, rhinorrhea, sneezing and sore throat.   Eyes: Negative.  Negative for redness.  Respiratory: Negative for cough, chest tightness, shortness of breath and wheezing.   Cardiovascular: Positive for leg swelling. Negative for chest pain and palpitations.  Gastrointestinal: Negative for abdominal pain, constipation, diarrhea, nausea and vomiting.  Endocrine: Negative for cold intolerance, heat intolerance, polydipsia, polyphagia and polyuria.       Blood sugars doing well   Genitourinary: Negative for dysuria and frequency.  Musculoskeletal: Positive for arthralgias, back pain, gait problem and myalgias. Negative for joint swelling and neck pain.  Skin: Negative for rash.  Allergic/Immunologic: Negative for environmental allergies, food allergies and immunocompromised state.  Neurological: Positive for weakness and headaches. Negative for tremors and numbness.  Hematological: Negative for adenopathy. Does not bruise/bleed easily.  Psychiatric/Behavioral: Negative for behavioral problems (Depression), sleep disturbance and suicidal ideas. The patient is not nervous/anxious.     Vital Signs: BP 137/78 (BP Location: Left Arm, Patient Position: Sitting, Cuff Size: Large)   Pulse 82   Resp 16   Ht 5\' 5"  (1.651 m)   Wt 286 lb 12.8 oz (130.1 kg)   SpO2 98%   BMI 47.73 kg/m    Physical Exam  Constitutional: He is oriented to person, place, and time. He appears well-developed and well-nourished. No distress.  HENT:  Head: Normocephalic and atraumatic.   Mouth/Throat: Oropharynx is clear and moist. No oropharyngeal exudate.  Eyes: Pupils are equal, round, and reactive to light. Conjunctivae and EOM are normal.  Neck: Normal range of motion. Neck supple. No JVD present. No tracheal deviation present. No thyromegaly present.  Cardiovascular: Normal rate and regular rhythm. Exam reveals no gallop and no friction rub.  Murmur heard. Severe bilateral lower extremity edema. Both lower legs are warm and very tender to palpate. There is moderate amount of clear fluid leaking from both legs. No distinct wound or lesion is noted at this time.   Pulmonary/Chest: Effort normal and breath sounds normal. No respiratory distress. He has no wheezes. He has no rales. He exhibits no tenderness.  Abdominal: Soft. Bowel sounds are normal. He exhibits no distension.  Musculoskeletal: Normal range of motion.  Lymphadenopathy:    He has no cervical adenopathy.  Neurological: He is alert and oriented to person, place, and time. He displays normal reflexes. No cranial nerve deficit.  Skin: Skin is warm and dry. Capillary refill takes more than 3 seconds. He is not diaphoretic. There is erythema.  Cellulitis present in both lower legs. Both are warm, tender, and red, with moderate to copious clear fluid draining from the skin of both legs.   Psychiatric: He has a normal mood and affect. His behavior is normal. Judgment and thought content normal.  Nursing note and vitals reviewed.  Assessment/Plan: 1. Cellulitis and abscess of lower extremity Due to severity of swelling and drainage, encouraged the patient to be seen in the ER. Likely required IV antibiotics. He has agreed to go to ER at this time.   2. Edema of both lower legs due to peripheral venous insufficiency Likely significant cellulitis of both lower legs. Recommended immmediate treatment in ER. conitnue diuretics as prescribed.   3. Chronic diastolic CHF (congestive heart failure), NYHA class 3 (HCC) BP  medication and diuretics should be continued as prescribed. Cardiology visits as scheduled.   4. Chronic pain disorder Pain medication  treatment as previously prescribed. Refills provided today.  - Oxycodone HCl 10 MG TABS; Take 1 tablet (10 mg total) by mouth 3 (three) times daily as needed.  Dispense: 90 tablet; Refill: 0 - fentaNYL (DURAGESIC - DOSED MCG/HR) 50 MCG/HR; Place 1 patch (50 mcg total) onto the skin every 3 (three) days.  Dispense: 10 patch; Refill: 0  5. Chronic obstructive pulmonary disease, unspecified COPD type ([place]) Use inhalers and respiratory medications as prescribed.   General Counseling: Damar verbalizes understanding of the findings of todays visit and agrees with plan of treatment. I have discussed any further diagnostic evaluation that may be needed or ordered today. We also reviewed his medications today. he has been encouraged to call the office with any questions or concerns that should arise related to todays visit.  This patient was seen by [name], FNP- C in Collaboration with Dr [name] as a part of collaborative care agreement  Meds ordered this encounter  Medications  . Oxycodone HCl 10 MG TABS    Sig: Take 1 tablet (10 mg total) by mouth 3 (three) times daily as needed.    Dispense:  90 tablet    Refill:  0    Order Specific Question:   Supervising Provider    Answer:   [name] [[id number]]  . fentaNYL (DURAGESIC - DOSED MCG/HR) 50 MCG/HR    Sig: Place 1 patch (50 mcg total) onto the skin every 3 (three) days.    Dispense:  10 patch    Refill:  0    Order Specific Question:   Supervising Provider    Answer:   [name] [[id number]]    Time spent: 16 Minutes      Dr [name] Internal medicine

## 2017-09-20 NOTE — ED Triage Notes (Signed)
Pt in via POV, sent over from PCP due to cellulitis.  Swelling, redness, weeping noted to BLE.  Pt A/Ox4, vitals WDL, NAD noted at this time.

## 2017-09-20 NOTE — ED Provider Notes (Signed)
[facility] Emergency Department Provider Note   ____________________________________________   First MD Initiated Contact with Patient 09/20/17 1432     (approximate)  I have reviewed the triage vital signs and the nursing notes.   HISTORY  Chief Complaint Cellulitis    HPI Adam Merritt is a 72 y.o. male who reports legs have been swelling for some time but in the last 2 days they have begun getting red, weeping and very painful.  He is also feeling bad feeling hot and cold chills.  Redness is up toward the knee now   Past Medical History:  Diagnosis Date  . Anemia   . CAD (coronary artery disease)   . Chest pain   . Coronary artery disease   . Diabetes mellitus without complication ([place])   . Esophageal reflux   . Herpes zoster without mention of complication   . Hyperlipidemia   . Hypertension   . Insomnia   . Lumbago   . Neuropathy in diabetes ([place])   . Obstructive chronic bronchitis without exacerbation ([place])   . Other malaise and fatigue   . Stroke ([place])   . Varicose veins     Patient Active Problem List   Diagnosis Date Noted  . Cellulitis and abscess of lower extremity 09/20/2017  . Edema of both lower legs due to peripheral venous insufficiency 09/20/2017  . Atopic dermatitis 09/02/2017  . Chronic pain disorder 09/02/2017  . Acute gout of right knee 08/31/2017  . AKI (acute kidney injury) ([place]) 08/29/2017  . Other malaise and fatigue 07/09/2017  . CVA (cerebral vascular accident) ([place]) 09/15/2016  . 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 CVA (cerebral infarction) 02/12/2015  . Chronic diastolic CHF (congestive heart failure), NYHA class 3 ([place]) 11/30/2014  . Benign essential hypertension 11/06/2014  . CAD (coronary artery disease) of artery bypass graft 01/19/2014  . CKD (chronic kidney disease), stage III ([place]) 01/19/2014  . COPD (chronic obstructive pulmonary disease) ([place]) 01/19/2014  . Diabetic neuropathy ([place])  01/19/2014  . Insulin dependent diabetes mellitus ([place]) 01/19/2014  . OSA (obstructive sleep apnea) 01/19/2014  . Mixed hyperlipidemia 10/29/2013  . Peripheral vascular disease, unspecified ([place]) 09/29/2013    Past Surgical History:  Procedure Laterality Date  . CHOLECYSTECTOMY    . CORONARY ARTERY BYPASS GRAFT      Prior to Admission medications   Medication Sig Start Date End Date Taking? Authorizing Provider  albuterol (PROVENTIL HFA;VENTOLIN HFA) 108 (90 Base) MCG/ACT inhaler Inhale 1 puff into the lungs every 6 (six) hours as needed for wheezing or shortness of breath. 09/10/17   [name], MD  aspirin 325 MG EC tablet Take 325 mg by mouth daily.    [provider]  atorvastatin (LIPITOR) 40 MG tablet Take 40 mg by mouth at bedtime.     [provider]  carvedilol (COREG) 25 MG tablet Take 25 mg by mouth 2 (two) times daily with a meal.    [provider]  clobetasol ointment (TEMOVATE) 0.05 % Apply to all affected areas on lower legs and feet bid 08/13/17   Ronnell Freshwater, NP  colchicine 0.6 MG tablet Take 1 tablet (0.6 mg total) by mouth daily. 08/31/17 08/31/18  Dustin Flock, MD  febuxostat (ULORIC) 40 MG tablet Take one tab a day for gout, cannot take allopurinol 09/04/17   [name], MD  fentaNYL (DURAGESIC - DOSED MCG/HR) 50 MCG/HR Place 1 patch (50 mcg total) onto the skin every 3 (three) days.  09/20/17   Ronnell Freshwater, NP  gabapentin (NEURONTIN) 400 MG capsule Take 400 mg by mouth 3 (three) times daily.     [provider]  hydrALAZINE (APRESOLINE) 25 MG tablet Take 25 mg by mouth 3 (three) times daily.    [provider]  ibuprofen (ADVIL,MOTRIN) 600 MG tablet Take 600 mg by mouth 2 (two) times daily as needed.    [provider]  indapamide (LOZOL) 2.5 MG tablet Take 1 tablet (2.5 mg total) by mouth daily. 09/11/17   [name], MD  Insulin Glargine [facility]) 100 UNIT/ML SOPN Inject 30 Units into the  skin at bedtime.    [provider]  ipratropium-albuterol (DUONEB) 0.5-2.5 (3) MG/3ML SOLN Take 3 mLs by nebulization every 4 (four) hours as needed (for shortness of breath). 09/10/17   [name], MD  Liraglutide (VICTOZA) 18 MG/3ML SOPN Inject 1.8 mg into the skin daily.     [provider]  Multiple Vitamin (MULTIVITAMIN WITH MINERALS) TABS tablet Take 1 tablet by mouth daily.    [provider]  omeprazole (PRILOSEC) 20 MG capsule Take 20 mg by mouth daily.    [provider]  ondansetron (ZOFRAN) 8 MG tablet Take 1 tablet (8 mg total) by mouth every 8 (eight) hours as needed for nausea or vomiting. 07/09/17   Ronnell Freshwater, NP  Oxycodone HCl 10 MG TABS Take 1 tablet (10 mg total) by mouth 3 (three) times daily as needed. 09/20/17   Ronnell Freshwater, NP  pentoxifylline (TRENTAL) 400 MG CR tablet Take 400 mg by mouth 3 (three) times daily.    [provider]  sennosides-docusate sodium (SENOKOT-S) 8.6-50 MG tablet Take 1-2 tablets by mouth 2 (two) times daily.    [provider]  UREA 20 INTENSIVE HYDRATING 20 % cream USE AS DIRECTED 3 TIMES A WEEK 09/12/17   [name], MD  zolpidem (AMBIEN CR) 12.5 MG CR tablet Take 1 tablet (12.5 mg total) by mouth at bedtime as needed for sleep. 06/29/17   Ronnell Freshwater, NP    Allergies Patient has no known allergies.  Family History  Problem Relation Age of Onset  . Diabetes Mother   . Hyperlipidemia Mother   . Hypertension Mother   . Diabetes Father   . Hyperlipidemia Father   . Hypertension Father     Social History Social History   Tobacco Use  . Smoking status: Never Smoker  . Smokeless tobacco: Never Used  Substance Use Topics  . Alcohol use: No  . Drug use: No    Review of Systems  Constitutional: Subjective fever/chills Eyes: No visual changes. ENT: No sore throat. Cardiovascular: Denies chest pain. Respiratory: Denies shortness of breath. Gastrointestinal: No  abdominal pain.  No nausea, no vomiting.  No diarrhea.  No constipation. Genitourinary: Negative for dysuria. Musculoskeletal: Negative for back pain. Skin: Negative for rash. Neurological: Negative for headaches, focal weakness  ____________________________________________   PHYSICAL EXAM:  VITAL SIGNS: ED Triage Vitals  Enc Vitals Group     BP 09/20/17 1222 138/78     Pulse Rate 09/20/17 1222 89     Resp 09/20/17 1222 16     Temp 09/20/17 1222 98.1 F (36.7 C)     Temp Source 09/20/17 1222 Oral     SpO2 09/20/17 1222 100 %     Weight 09/20/17 1224 300 lb (136.1 kg)     Height 09/20/17 1224 5\' 6"  (1.676 m)  Head Circumference --      Peak Flow --      Pain Score 09/20/17 1223 7     Pain Loc --      Pain Edu? --      Excl. in [place]? --     Constitutional: Alert and oriented. Well appearing and in no acute distress. Eyes: Conjunctivae are normal.  Head: Atraumatic. Nose: No congestion/rhinnorhea. Mouth/Throat: Mucous membranes are moist.  Oropharynx non-erythematous. Neck: No stridor.   Cardiovascular: Normal rate, regular rhythm. Grossly normal heart sounds.  Good peripheral circulation. Respiratory: Normal respiratory effort.  No retractions. Lungs CTAB. Gastrointestinal: Soft and nontender. No distention. No abdominal bruits. No CVA tenderness. Musculoskeletal: Both legs are red swollen tender warm and oozy. Neurologic:  Normal speech and language. No gross focal neurologic deficits are appreciated Skin:  Skin is warm, dry and intact. No rash noted. Psychiatric: Mood and affect are normal. Speech and behavior are normal.  ____________________________________________   LABS (all labs ordered are listed, but only abnormal results are displayed)  Labs Reviewed  COMPREHENSIVE METABOLIC PANEL - Abnormal; Notable for the following components:      Result Value   Chloride 100 (*)    Glucose, Bld 201 (*)    BUN 25 (*)    Creatinine, Ser 2.02 (*)    GFR calc non Af  Amer 31 (*)    GFR calc Af Amer 36 (*)    All other components within normal limits  CBC WITH DIFFERENTIAL/PLATELET - Abnormal; Notable for the following components:   RBC 4.33 (*)    Platelets 128 (*)    Lymphs Abs 0.8 (*)    All other components within normal limits   ____________________________________________  EKG   ____________________________________________  RADIOLOGY  ED MD interpretation:    Official radiology report(s): No results found.  ____________________________________________   PROCEDURES  Procedure(s) performed:   Procedures  Critical Care performed:   ____________________________________________   INITIAL IMPRESSION / ASSESSMENT AND PLAN / ED COURSE  Patient's creatinine is rising and his GFR is falling again.  I will give him IV antibiotics for his apparent cellulitis some fluids and anticipate admitting the patient         ____________________________________________   FINAL CLINICAL IMPRESSION(S) / ED DIAGNOSES  Final diagnoses:  Cellulitis of lower extremity, unspecified laterality  AKI (acute kidney injury) [facility])     ED Discharge Orders    None       Note:  This document was prepared using Dragon voice recognition software and may include unintentional dictation errors.    Nena Polio, MD 09/20/17 1501

## 2017-09-20 NOTE — ED Notes (Signed)
Nurse spoke with pt's wife to update her on pt's status per Mr. Ruybal's request.

## 2017-09-21 LAB — GLUCOSE, CAPILLARY
GLUCOSE-CAPILLARY: 119 mg/dL — AB (ref 65–99)
GLUCOSE-CAPILLARY: 138 mg/dL — AB (ref 65–99)
Glucose-Capillary: 93 mg/dL (ref 65–99)
Glucose-Capillary: 110 mg/dL — ABNORMAL HIGH (ref 65–99)

## 2017-09-21 LAB — CREATININE, SERUM
Creatinine, Ser: 1.61 mg/dL — ABNORMAL HIGH (ref 0.61–1.24)
GFR calc non Af Amer: 41 mL/min — ABNORMAL LOW (ref >60)
GFR, EST AFRICAN AMERICAN: 47 mL/min — AB (ref >60)

## 2017-09-21 NOTE — Consult Note (Signed)
Pharmacy Antibiotic Note  Adam Merritt is a 72 y.o. male admitted on 09/20/2017 with cellulitis.  Pharmacy has been consulted for unasyn and vancomycin dosing.  Plan: Vancomycin 1g once in ED. Will give next dose in 6 hours for stacked dosing Vancomycin 1500 IV every 24 hours.  Goal trough 10-15 mcg/mL.  Trough prior to 5th total dose  -unasyn 3g q 6 hours   4/19: Scr improved to 1.61. PK: Ke= 0.035  T1/2  19.8  Vd 65.9. AdjBW=94.1 kg Will continue current dosing. Follow renal fxn.   Height: 5\' 6"  (167.6 cm) Weight: (!) 307 lb 9.6 oz (139.5 kg) IBW/kg (Calculated) : 63.8  Temp (24hrs), Avg:98.3 F (36.8 C), Min:98.1 F (36.7 C), Max:98.5 F (36.9 C)  Recent Labs  Lab 09/20/17 1226 09/21/17 0828  WBC 6.6  --   CREATININE 2.02* 1.61*    Estimated Creatinine Clearance: 54.4 mL/min (A) (by C-G formula based on SCr of 1.61 mg/dL (H)).    No Known Allergies  Antimicrobials this admission: zosyn 4/18 >> one dose unasyn 4/18 >>  Vancomycin 4/18>>  Dose adjustments this admission:  Microbiology results:   Thank you for allowing pharmacy to be a part of this patient's care.  Chinita Greenland PharmD Clinical Pharmacist 09/21/2017

## 2017-09-21 NOTE — Progress Notes (Signed)
Per MD hold hydralazine at this time and give other BP meds.

## 2017-09-21 NOTE — Care Management (Addendum)
Lives with wife. Last seen [place] NP 08/13/17. Rolling walker, rollayor, shower chair, and ramp in the home.  Referral was went to [facility] on 08/31/17.  Brittney  With [facility] notified of admission.  Open with RN, PT,and OT. PT consult pending.  [place] nurse is recommending home health dressing changes

## 2017-09-21 NOTE — Progress Notes (Signed)
Per Dr. Jannifer Franklin hold lantus insulin as pt has not had appetite and did not eat supper tray blood glucose of 110.

## 2017-09-21 NOTE — Consult Note (Addendum)
[place] Nurse wound consult note Reason for Consult: Consult requested for bilat legs.  Pt states he was followed in the past by the outpatient wound care center for a stasis ulcer, which has healed.  He was wearing compression wraps until about a month ago.  He was no longer wearing wraps or followed by the wound center after the site healed.  He developed redness and swelling several days ago, and was admitted with cellulitis to BLE;  Pt is on systemic coverage with antibiotics. Wound type: Previous blisters have evolved into patchy areas of partial thickness skin loss, largest location is 3X3X.1cm, all other areas are smaller, draining large amt clear yellow fluid and red moist wound beds, surrounded by loose peeling skin.  Large amt  generalized edema and erythremia.  Dressing procedure/placement/frequency: Instructions provided for bedside nurses to apply xeroform gauze to promote drying and healing, and ABD pads and kerlex to absorb drainage. Ace wraps for light compression.  Discussed plan of care with patient and he verbalized understanding. Pt could benefit from home health for dressing change assistance after discharge, please order if desired. Please re-consult if further assistance is needed.  Thank-you,  Julien Girt MSN, [employer], [place], [place], [place]

## 2017-09-21 NOTE — Progress Notes (Signed)
[place] [facility] [place] NAME: Adam Merritt    MR#:  [id number]  DATE OF BIRTH:  02-08-1946  SUBJECTIVE:   Patient here due to bilateral lower extremity redness and swelling and suspected to have cellulitis.  Seen by wound care earlier today and legs were wrapped.  Patient does have significant redness of his left lower extremity.  No fever, elevated white cell count overnight.  REVIEW OF SYSTEMS:    Review of Systems  Constitutional: Negative for chills and fever.  HENT: Negative for congestion and tinnitus.   Eyes: Negative for blurred vision and double vision.  Respiratory: Negative for cough, shortness of breath and wheezing.   Cardiovascular: Negative for chest pain, orthopnea and PND.  Gastrointestinal: Negative for abdominal pain, diarrhea, nausea and vomiting.  Genitourinary: Negative for dysuria and hematuria.  Neurological: Negative for dizziness, sensory change and focal weakness.  All other systems reviewed and are negative.   Nutrition: Heart Healthy/Carb control Tolerating Diet: Yes Tolerating PT: Await Eval.    DRUG ALLERGIES:  No Known Allergies  VITALS:  Blood pressure 134/78, pulse 92, temperature 98.5 F (36.9 C), temperature source Oral, resp. rate 17, height 5\' 6"  (1.676 m), weight (!) 139.5 kg (307 lb 9.6 oz), SpO2 96 %.  PHYSICAL EXAMINATION:   Physical Exam  GENERAL:  72 y.o.-year-old obese patient lying in bed in no acute distress.  EYES: Pupils equal, round, reactive to light and accommodation. No scleral icterus. Extraocular muscles intact.  HEENT: Head atraumatic, normocephalic. Oropharynx and nasopharynx clear.  NECK:  Supple, no jugular venous distention. No thyroid enlargement, no tenderness.  LUNGS: Normal breath sounds bilaterally, no wheezing, rales, rhonchi. No use of accessory muscles of respiration.  CARDIOVASCULAR: S1, S2 normal. No murmurs, rubs, or gallops.  ABDOMEN: Soft, nontender,  nondistended. Bowel sounds present. No organomegaly or mass.  EXTREMITIES: No cyanosis, clubbing, + 2 edema b/l.  Legs wrapped b/l in ACE bandage.  LLE redness and warmth compared to right.   NEUROLOGIC: Cranial nerves II through XII are intact. No focal Motor or sensory deficits b/l.  Globally weak.  PSYCHIATRIC: The patient is alert and oriented x 3.  SKIN: No obvious rash, lesion, or ulcer.    LABORATORY PANEL:   CBC Recent Labs  Lab 09/20/17 1226  WBC 6.6  HGB 14.0  HCT 41.2  PLT 128*   ------------------------------------------------------------------------------------------------------------------  Chemistries  Recent Labs  Lab 09/20/17 1226 09/21/17 0828  NA 135  --   K 3.5  --   CL 100*  --   CO2 27  --   GLUCOSE 201*  --   BUN 25*  --   CREATININE 2.02* 1.61*  CALCIUM 9.1  --   AST 26  --   ALT 22  --   ALKPHOS 85  --   BILITOT 1.2  --    ------------------------------------------------------------------------------------------------------------------  Cardiac Enzymes No results for input(s): TROPONINI in the last 168 hours. ------------------------------------------------------------------------------------------------------------------  RADIOLOGY:  US Venous Img Lower Bilateral  Result Date: 09/20/2017 CLINICAL DATA:  Redness and swelling in lower extremities. Possible cellulitis. Rule out DVT. EXAM: BILATERAL LOWER EXTREMITY VENOUS DOPPLER ULTRASOUND TECHNIQUE: Gray-scale sonography with graded compression, as well as color Doppler and duplex ultrasound were performed to evaluate the lower extremity deep venous systems from the level of the common femoral vein and including the common femoral, femoral, profunda femoral, popliteal and calf veins including the posterior tibial, peroneal and gastrocnemius veins when visible. The superficial great saphenous  vein was also interrogated. Spectral Doppler was utilized to evaluate flow at rest and with distal  augmentation maneuvers in the common femoral, femoral and popliteal veins. COMPARISON:  10/11/2013 FINDINGS: RIGHT LOWER EXTREMITY Common Femoral Vein: No evidence of thrombus. Normal compressibility, respiratory phasicity and response to augmentation. Saphenofemoral Junction: No evidence of thrombus. Normal compressibility and flow on color Doppler imaging. Profunda Femoral Vein: No evidence of thrombus. Normal compressibility and flow on color Doppler imaging. Femoral Vein: No evidence of thrombus. Normal compressibility, respiratory phasicity and response to augmentation. Popliteal Vein: No evidence of thrombus. Normal compressibility, respiratory phasicity and response to augmentation. Calf Veins: Calf veins not visualized. LEFT LOWER EXTREMITY Common Femoral Vein: No evidence of thrombus. Normal compressibility, respiratory phasicity and response to augmentation. Saphenofemoral Junction: No evidence of thrombus. Normal compressibility and flow on color Doppler imaging. Profunda Femoral Vein: No evidence of thrombus. Normal compressibility and flow on color Doppler imaging. Femoral Vein: No evidence of thrombus. Normal compressibility, respiratory phasicity and response to augmentation. Popliteal Vein: No evidence of thrombus. Normal compressibility, respiratory phasicity and response to augmentation. Calf Veins: Calf veins not visualized. Other Findings:  Subcutaneous edema. IMPRESSION: No evidence of deep venous thrombosis in the lower extremities. However, calf veins could not be evaluated due to body habitus and edema. Electronically Signed   By: Markus Daft M.D.   On: 09/20/2017 16:26     ASSESSMENT AND PLAN:   Adam Merritt  is a 72 y.o. 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  1.   Bilateral lower extremity cellulitis-unclear if this is true cellulitis versus chronic venous stasis given his poor mobility. - Continue IV vancomycin, will DC IV Unasyn for now.  Continue Lasix. -Appreciate wound team care consult and continue local wound care.  Patient may benefit from [facility] boot to be in place prior to discharge.  Dopplers of lower extremities are negative for DVT.  Continue supportive care. -  He can follow-up with vascular as outpatient  2.  Acute renal failure on CKD-has CKD stage IV with baseline creatinine around 1.5.  Received fluids during last admission 2 weeks ago. -Increased fluid retention now.  cont. IV Lasix and Cr. Improving.   3.  Diabetes mellitus-continue Lantus, Victoza. - cont. SSI and BS stable.   4.  History of gout-no acute attack.  Continue colchicine, Uloric  5.  CAD-stable.  No chest pain.  - cont. aspirin, atorvastatin, carvedilol.  6.  Chronic pain-continue fentanyl patch, Oxycodone.   7. GERD - cont. Protonix.   8. Hx of PAD - cont. Trental.     All the records are reviewed and case discussed with Care Management/Social Worker. Management plans discussed with the patient, family and they are in agreement.  CODE STATUS: Full code  DVT Prophylaxis: Hep SQ  TOTAL TIME TAKING CARE OF THIS PATIENT: 30 minutes.   POSSIBLE D/C IN 1-2 DAYS, DEPENDING ON CLINICAL CONDITION.   Henreitta Leber M.D on 09/21/2017 at 2:30 PM  Between 7am to 6pm - Pager - [phone]  After 6pm go to [email or web address] - password [email or web address] [facility]  Office  [phone]  CC: Primary care physician; [name], MD

## 2017-09-22 LAB — GLUCOSE, CAPILLARY
GLUCOSE-CAPILLARY: 113 mg/dL — AB (ref 65–99)
Glucose-Capillary: 166 mg/dL — ABNORMAL HIGH (ref 65–99)
Glucose-Capillary: 131 mg/dL — ABNORMAL HIGH (ref 65–99)
Glucose-Capillary: 164 mg/dL — ABNORMAL HIGH (ref 65–99)

## 2017-09-22 LAB — BASIC METABOLIC PANEL
Anion gap: 6 (ref 5–15)
BUN: 17 mg/dL (ref 6–20)
CALCIUM: 8.4 mg/dL — AB (ref 8.9–10.3)
CHLORIDE: 101 mmol/L (ref 101–111)
CO2: 33 mmol/L — ABNORMAL HIGH (ref 22–32)
CREATININE: 1.51 mg/dL — AB (ref 0.61–1.24)
GFR calc Af Amer: 51 mL/min — ABNORMAL LOW (ref >60)
GFR, EST NON AFRICAN AMERICAN: 44 mL/min — AB (ref >60)
Glucose, Bld: 113 mg/dL — ABNORMAL HIGH (ref 65–99)
Potassium: 2.5 mmol/L — CL (ref 3.5–5.1)
SODIUM: 140 mmol/L (ref 135–145)

## 2017-09-22 LAB — MAGNESIUM: MAGNESIUM: 1.6 mg/dL — AB (ref 1.7–2.4)

## 2017-09-22 MED ORDER — POTASSIUM CHLORIDE CRYS ER 20 MEQ PO TBCR
20.0000 meq | EXTENDED_RELEASE_TABLET | Freq: Two times a day (BID) | ORAL | Status: DC
  Administered 2017-09-22 – 2017-09-23 (×3): 20 meq via ORAL
  Filled 2017-09-22 (×3): qty 1

## 2017-09-22 MED ORDER — MAGNESIUM SULFATE 4 GM/100ML IV SOLN
4.0000 g | Freq: Once | INTRAVENOUS | Status: AC
  Administered 2017-09-22: 4 g via INTRAVENOUS
  Filled 2017-09-22: qty 100

## 2017-09-22 MED ORDER — POTASSIUM CHLORIDE CRYS ER 20 MEQ PO TBCR
40.0000 meq | EXTENDED_RELEASE_TABLET | Freq: Once | ORAL | Status: AC
  Administered 2017-09-22: 40 meq via ORAL
  Filled 2017-09-22: qty 2

## 2017-09-22 NOTE — Progress Notes (Signed)
Patient requested medication for sleep. PRN administered per order. ELQ

## 2017-09-22 NOTE — Progress Notes (Signed)
[facility] [place] NAME: Adam Merritt    MR#:  [id number]  DATE OF BIRTH:  1945/12/09  SUBJECTIVE:   No acute events overnight, potassium level down to 2.5 this morning.  Lower extremities are less swollen and less painful as per the patient.  REVIEW OF SYSTEMS:    Review of Systems  Constitutional: Negative for chills and fever.  HENT: Negative for congestion and tinnitus.   Eyes: Negative for blurred vision and double vision.  Respiratory: Negative for cough, shortness of breath and wheezing.   Cardiovascular: Negative for chest pain, orthopnea and PND.  Gastrointestinal: Negative for abdominal pain, diarrhea, nausea and vomiting.  Genitourinary: Negative for dysuria and hematuria.  Neurological: Negative for dizziness, sensory change and focal weakness.  All other systems reviewed and are negative.   Nutrition: Heart Healthy/Carb control Tolerating Diet: Yes Tolerating PT: Await Eval.    DRUG ALLERGIES:  No Known Allergies  VITALS:  Blood pressure 136/71, pulse 75, temperature 97.7 F (36.5 C), temperature source Oral, resp. rate 16, height 5\' 6"  (1.676 m), weight (!) 136.3 kg (300 lb 6.4 oz), SpO2 97 %.  PHYSICAL EXAMINATION:   Physical Exam  GENERAL:  72 y.o.-year-old obese patient lying in bed in no acute distress.  EYES: Pupils equal, round, reactive to light and accommodation. No scleral icterus. Extraocular muscles intact.  HEENT: Head atraumatic, normocephalic. Oropharynx and nasopharynx clear.  NECK:  Supple, no jugular venous distention. No thyroid enlargement, no tenderness.  LUNGS: Normal breath sounds bilaterally, no wheezing, rales, rhonchi. No use of accessory muscles of respiration.  CARDIOVASCULAR: S1, S2 normal. No murmurs, rubs, or gallops.  ABDOMEN: Soft, nontender, nondistended. Bowel sounds present. No organomegaly or mass.  EXTREMITIES: No cyanosis, clubbing, + 2 edema b/l.  Legs wrapped b/l in ACE  bandage.   NEUROLOGIC: Cranial nerves II through XII are intact. No focal Motor or sensory deficits b/l.  Globally weak.  PSYCHIATRIC: The patient is alert and oriented x 3.  SKIN: No obvious rash, lesion, or ulcer.    LABORATORY PANEL:   CBC Recent Labs  Lab 09/20/17 1226  WBC 6.6  HGB 14.0  HCT 41.2  PLT 128*   ------------------------------------------------------------------------------------------------------------------  Chemistries  Recent Labs  Lab 09/20/17 1226  09/22/17 0621  NA 135  --  140  K 3.5  --  2.5*  CL 100*  --  101  CO2 27  --  33*  GLUCOSE 201*  --  113*  BUN 25*  --  17  CREATININE 2.02*   < > 1.51*  CALCIUM 9.1  --  8.4*  MG  --   --  1.6*  AST 26  --   --   ALT 22  --   --   ALKPHOS 85  --   --   BILITOT 1.2  --   --    < > = values in this interval not displayed.   ------------------------------------------------------------------------------------------------------------------  Cardiac Enzymes No results for input(s): TROPONINI in the last 168 hours. ------------------------------------------------------------------------------------------------------------------  RADIOLOGY:  US Venous Img Lower Bilateral  Result Date: 09/20/2017 CLINICAL DATA:  Redness and swelling in lower extremities. Possible cellulitis. Rule out DVT. EXAM: BILATERAL LOWER EXTREMITY VENOUS DOPPLER ULTRASOUND TECHNIQUE: Gray-scale sonography with graded compression, as well as color Doppler and duplex ultrasound were performed to evaluate the lower extremity deep venous systems from the level of the common femoral vein and including the common femoral, femoral, profunda femoral, popliteal and calf  veins including the posterior tibial, peroneal and gastrocnemius veins when visible. The superficial great saphenous vein was also interrogated. Spectral Doppler was utilized to evaluate flow at rest and with distal augmentation maneuvers in the common femoral, femoral and  popliteal veins. COMPARISON:  10/11/2013 FINDINGS: RIGHT LOWER EXTREMITY Common Femoral Vein: No evidence of thrombus. Normal compressibility, respiratory phasicity and response to augmentation. Saphenofemoral Junction: No evidence of thrombus. Normal compressibility and flow on color Doppler imaging. Profunda Femoral Vein: No evidence of thrombus. Normal compressibility and flow on color Doppler imaging. Femoral Vein: No evidence of thrombus. Normal compressibility, respiratory phasicity and response to augmentation. Popliteal Vein: No evidence of thrombus. Normal compressibility, respiratory phasicity and response to augmentation. Calf Veins: Calf veins not visualized. LEFT LOWER EXTREMITY Common Femoral Vein: No evidence of thrombus. Normal compressibility, respiratory phasicity and response to augmentation. Saphenofemoral Junction: No evidence of thrombus. Normal compressibility and flow on color Doppler imaging. Profunda Femoral Vein: No evidence of thrombus. Normal compressibility and flow on color Doppler imaging. Femoral Vein: No evidence of thrombus. Normal compressibility, respiratory phasicity and response to augmentation. Popliteal Vein: No evidence of thrombus. Normal compressibility, respiratory phasicity and response to augmentation. Calf Veins: Calf veins not visualized. Other Findings:  Subcutaneous edema. IMPRESSION: No evidence of deep venous thrombosis in the lower extremities. However, calf veins could not be evaluated due to body habitus and edema. Electronically Signed   By: Markus Daft M.D.   On: 09/20/2017 16:26     ASSESSMENT AND PLAN:   Adam Merritt  is a 72 y.o. 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  1.  Bilateral lower extremity cellulitis-unclear if this is true  cellulitis versus chronic venous stasis given his poor mobility. - Continue IV vancomycin and will d/c IV lasix for now.  Swelling and pain has improved since admission. . -Appreciate wound team care consult and continue local wound care.  Patient may benefit from Unna boots prior to discharge.  Dopplers of lower extremities are negative for DVT.  Continue supportive care. - vascular follow up as outpatient.   2.  Acute renal failure on CKD-has CKD stage IV with baseline creatinine around 1.5.   - Cr. Close to baseline now with IV diuresis and will follow BUN/Cr.   3.  Diabetes mellitus-continue Lantus, Victoza. - cont. SSI and BS stable.   4. Hypokalemia - due to diuresis. Will replace repeat level in the morning.  Patient's magnesium level was low and patient given mag sulfate too.  5.  History of gout-no acute attack.  Continue colchicine, Uloric  6.  CAD-stable.  No chest pain.  - cont. aspirin, atorvastatin, carvedilol.  7.  Chronic pain-continue fentanyl patch, Oxycodone.   8. GERD - cont. Protonix.   9. Hx of PAD - cont. Trental.   Await PT eval but likely d/c home tomorrow.   All the records are reviewed and case discussed with Care Management/Social Worker. Management plans discussed with the patient, family and they are in agreement.  CODE STATUS: Full code  DVT Prophylaxis: Hep SQ  TOTAL TIME TAKING CARE OF THIS PATIENT: 30 minutes.   POSSIBLE D/C IN 1-2 DAYS, DEPENDING ON CLINICAL CONDITION.   Henreitta Leber M.D on 09/22/2017 at 1:49 PM  Between 7am to 6pm - Pager - [phone]  After 6pm go to  [email or web address] - password [email or web address] [facility]  Office  [phone]  CC: Primary care physician; [name], MD

## 2017-09-22 NOTE — Progress Notes (Signed)
PT Cancellation Note  Patient Details Name: Adam Merritt MRN: [id number] DOB: Aug 28, 1945   Cancelled Treatment:    Reason Eval/Treat Not Completed: Medical issues which prohibited therapy.  Pt is not able to be seen and will reck his K+ values to reschedule for PT evaluation.   Ramond Dial 09/22/2017, 1:08 PM   Mee Hives, PT MS Acute Rehab Dept. Number: [place] [facility] [place]

## 2017-09-22 NOTE — Progress Notes (Signed)
CRITICAL VALUE ALERT  Critical Value:  K 2.5  Date & Time Notied:  09/22/17 0715  Provider Notified: Dr. Verdell Carmine  Orders Received/Actions taken: awaiting call back

## 2017-09-22 NOTE — Consult Note (Signed)
Pharmacy Antibiotic Note  Adam Merritt is a 72 y.o. male admitted on 09/20/2017 with cellulitis.  Pharmacy has been consulted for unasyn and vancomycin dosing. Unasyn d/c'd 4/19.  Plan: Vancomycin 1g once in ED. Will give next dose in 6 hours for stacked dosing Vancomycin 1500 IV every 24 hours.  Goal trough 10-15 mcg/mL.  Trough prior to 5th total dose  4/19: Scr improved to 1.61. PK: Ke= 0.035  T1/2  19.8  Vd 65.9. AdjBW=94.1 kg Will continue current dosing. Follow renal fxn.  4/20 SCr 1.51, Ke 0.037, half life 18.7 h. Will continue current dosing and follow renal function.    Height: 5\' 6"  (167.6 cm) Weight: (!) 300 lb 6.4 oz (136.3 kg) IBW/kg (Calculated) : 63.8  Temp (24hrs), Avg:98 F (36.7 C), Min:97.7 F (36.5 C), Max:98.3 F (36.8 C)  Recent Labs  Lab 09/20/17 1226 09/21/17 0828 09/22/17 0621  WBC 6.6  --   --   CREATININE 2.02* 1.61* 1.51*    Estimated Creatinine Clearance: 57.2 mL/min (A) (by C-G formula based on SCr of 1.51 mg/dL (H)).    No Known Allergies  Antimicrobials this admission: zosyn 4/18 >> one dose unasyn 4/18 >> 4/19 Vancomycin 4/18>>  Dose adjustments this admission:  Microbiology results: none  Thank you for allowing pharmacy to be a part of this patient's care.  Rayna Sexton, PharmD, BCPS Clinical Pharmacist 09/22/2017 1:53 PM

## 2017-09-23 LAB — BASIC METABOLIC PANEL
ANION GAP: 8 (ref 5–15)
BUN: 17 mg/dL (ref 6–20)
CHLORIDE: 100 mmol/L — AB (ref 101–111)
CO2: 31 mmol/L (ref 22–32)
Calcium: 8.4 mg/dL — ABNORMAL LOW (ref 8.9–10.3)
Creatinine, Ser: 1.52 mg/dL — ABNORMAL HIGH (ref 0.61–1.24)
GFR calc Af Amer: 51 mL/min — ABNORMAL LOW (ref >60)
GFR calc non Af Amer: 44 mL/min — ABNORMAL LOW (ref >60)
Glucose, Bld: 108 mg/dL — ABNORMAL HIGH (ref 65–99)
POTASSIUM: 2.9 mmol/L — AB (ref 3.5–5.1)
SODIUM: 139 mmol/L (ref 135–145)

## 2017-09-23 LAB — CBC
HEMATOCRIT: 35.7 % — AB (ref 40.0–52.0)
HEMOGLOBIN: 12.5 g/dL — AB (ref 13.0–18.0)
MCH: 32.8 pg (ref 26.0–34.0)
MCHC: 35.2 g/dL (ref 32.0–36.0)
MCV: 93.1 fL (ref 80.0–100.0)
Platelets: 128 10*3/uL — ABNORMAL LOW (ref 150–440)
RBC: 3.83 MIL/uL — AB (ref 4.40–5.90)
RDW: 13.6 % (ref 11.5–14.5)
WBC: 4.8 10*3/uL (ref 3.8–10.6)

## 2017-09-23 LAB — MAGNESIUM: Magnesium: 1.9 mg/dL (ref 1.7–2.4)

## 2017-09-23 LAB — GLUCOSE, CAPILLARY
GLUCOSE-CAPILLARY: 132 mg/dL — AB (ref 65–99)
Glucose-Capillary: 126 mg/dL — ABNORMAL HIGH (ref 65–99)

## 2017-09-23 LAB — POTASSIUM: POTASSIUM: 3.3 mmol/L — AB (ref 3.5–5.1)

## 2017-09-23 MED ORDER — POTASSIUM CHLORIDE CRYS ER 20 MEQ PO TBCR
40.0000 meq | EXTENDED_RELEASE_TABLET | Freq: Once | ORAL | Status: AC
  Administered 2017-09-23: 40 meq via ORAL
  Filled 2017-09-23: qty 2

## 2017-09-23 MED ORDER — DOXYCYCLINE HYCLATE 100 MG PO CAPS: 100.0000 mg | ORAL_CAPSULE | Freq: Two times a day (BID) | ORAL | 0 refills | Status: DC

## 2017-09-23 NOTE — Discharge Summary (Signed)
[facility] [place] NAME: Adam Merritt    MR#:  [id number]  DATE OF BIRTH:  1945/09/15  DATE OF ADMISSION:  09/20/2017 ADMITTING PHYSICIAN: Gladstone Lighter, MD  DATE OF DISCHARGE: 09/23/2017  PRIMARY CARE PHYSICIAN: [name], MD    ADMISSION DIAGNOSIS:  AKI (acute kidney injury) ([place]) [N17.9] Cellulitis of lower extremity, unspecified laterality [L03.119]  DISCHARGE DIAGNOSIS:  Active Problems:   Cellulitis   SECONDARY DIAGNOSIS:   Past Medical History:  Diagnosis Date  . Anemia   . CAD (coronary artery disease)   . Chest pain   . Coronary artery disease   . Diabetes mellitus without complication ([place])   . Esophageal reflux   . Herpes zoster without mention of complication   . Hyperlipidemia   . Hypertension   . Insomnia   . Lumbago   . Neuropathy in diabetes ([place])   . Obstructive chronic bronchitis without exacerbation ([place])   . Other malaise and fatigue   . Stroke ([place])   . 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  1.Bilateral lower extremitycellulitis-was likely a combination of mild cellulitis along with chronic venous stasis. -Patient was given IV Lasix for the swelling, IV vancomycin for the cellulitis and a wound team consult was obtained. - Has clinically improved with therapy as mentioned above.  He is being discharged home with home health nursing services for dressing changes.  He is also being discharged on oral doxycycline empirically to treat for underlying cellulitis.  He will continue oral diuretics for his swelling.  Follow-up with his primary care physician as an outpatient.  2. Acute renal failure on CKD-has CKD stage  IV with baseline creatinine around 1.5. Presented with Cr. Of 2.0 and improved with diuresis and Cr. Back to baseline now. Follow up Cr. As outpatient.   3. Diabetes mellitus- BS stable while in hospital and pt. Will resume his Lantus, Victoza upon discharge.   4. Hypokalemia - due to diuresis. Improved with supplementation. Mg level checked and it was low and pt. Given IV mg and that has improved too.   5.  History of gout-no acute attack.  pt. Will Continue colchicine, Uloric  6. CAD-stable. No chest pain while in hospital.  - pt. Will cont. aspirin, atorvastatin, carvedilol.  7. Chronic pain- pt. Will continue fentanyl patch, Oxycodone.   8. GERD - pt. Will resume his Omeprazole   9. Hx of PAD - pt. Will resume his Trental   DISCHARGE CONDITIONS:   Stable  CONSULTS OBTAINED:    DRUG ALLERGIES:  No Known Allergies  DISCHARGE MEDICATIONS:   Allergies as of 09/23/2017   No Known Allergies     Medication List    STOP taking these medications   clobetasol ointment 0.05 % Commonly known as:  TEMOVATE   ondansetron 8 MG tablet Commonly known as:  ZOFRAN     TAKE these medications   albuterol 108 (90 Base) MCG/ACT inhaler Commonly known as:  PROVENTIL HFA;VENTOLIN HFA Inhale 1 puff into the lungs every 6 (six) hours as needed for wheezing or shortness of breath.   aspirin 325 MG EC tablet Take 325 mg by mouth daily.   atorvastatin 40 MG tablet Commonly known as:  LIPITOR Take 40 mg by mouth at bedtime.   BASAGLAR KWIKPEN 100 UNIT/ML Sopn Inject 30 Units into the skin at bedtime.   carvedilol 25 MG tablet Commonly known as:  COREG Take 25 mg by mouth 2 (two) times daily with a meal.   colchicine 0.6 MG tablet Take 1 tablet (0.6 mg total) by mouth daily.   doxycycline 100 MG capsule Commonly known as:  VIBRAMYCIN Take 1 capsule (100 mg total) by mouth 2 (two) times daily for 7 days.   febuxostat 40 MG tablet Commonly known as:  ULORIC Take one  tab a day for gout, cannot take allopurinol What changed:    how much to take  how to take this  when to take this  additional instructions   fentaNYL 50 MCG/HR Commonly known as:  DURAGESIC - dosed mcg/hr Place 1 patch (50 mcg total) onto the skin every 3 (three) days.   gabapentin 400 MG capsule Commonly known as:  NEURONTIN Take 400 mg by mouth 3 (three) times daily.   hydrALAZINE 25 MG tablet Commonly known as:  APRESOLINE Take 25 mg by mouth 3 (three) times daily.   ibuprofen 600 MG tablet Commonly known as:  ADVIL,MOTRIN Take 600 mg by mouth 2 (two) times daily as needed for mild pain or moderate pain.   indapamide 2.5 MG tablet Commonly known as:  LOZOL Take 1 tablet (2.5 mg total) by mouth daily.   ipratropium-albuterol 0.5-2.5 (3) MG/3ML Soln Commonly known as:  DUONEB Take 3 mLs by nebulization every 4 (four) hours as needed (for shortness of breath).   multivitamin with minerals Tabs tablet Take 1 tablet by mouth daily.   omeprazole 20 MG capsule Commonly known as:  PRILOSEC Take 20 mg by mouth daily.   Oxycodone HCl 10 MG Tabs Take 1 tablet (10 mg total) by mouth 3 (three) times daily as needed.   pentoxifylline 400 MG CR tablet Commonly known as:  TRENTAL Take 400 mg by mouth 3 (three) times daily.   sennosides-docusate sodium 8.6-50 MG tablet Commonly known as:  SENOKOT-S Take 1-2 tablets by mouth 2 (two) times daily.   UREA 20 INTENSIVE HYDRATING 20 % cream Generic drug:  urea USE AS DIRECTED 3 TIMES A WEEK   VICTOZA 18 MG/3ML Sopn Generic drug:  liraglutide Inject 1.8 mg into the skin daily.   zolpidem 12.5 MG CR tablet Commonly known as:  AMBIEN CR Take 1 tablet (12.5 mg total) by mouth at bedtime as needed for sleep.         DISCHARGE INSTRUCTIONS:   DIET:  Cardiac diet and Diabetic diet  DISCHARGE CONDITION:  Stable  ACTIVITY:  Activity as tolerated  OXYGEN:  Home Oxygen: No.   Oxygen Delivery: room  air  DISCHARGE LOCATION:  Home with Home Health PT, RN   If you experience worsening of your admission symptoms, develop shortness of breath, life threatening emergency, suicidal or homicidal thoughts you must seek medical attention immediately by calling 911 or calling your MD immediately  if symptoms less severe.  You Must read complete instructions/literature along with all the possible adverse reactions/side effects for all the Medicines you take and that have been prescribed to you. Take any new Medicines after you have completely understood and accpet all the possible adverse reactions/side effects.   Please note  You were cared for by a hospitalist during your hospital stay. If you have any questions about your discharge medications or the care you received while you were in  the hospital after you are discharged, you can call the unit and asked to speak with the hospitalist on call if the hospitalist that took care of you is not available. Once you are discharged, your primary care physician will handle any further medical issues. Please note that NO REFILLS for any discharge medications will be authorized once you are discharged, as it is imperative that you return to your primary care physician (or establish a relationship with a primary care physician if you do not have one) for your aftercare needs so that they can reassess your need for medications and monitor your lab values.     Today   Afebrile, lower extremity edema and redness and pain has improved.  Patient will be discharged home with home health services.  VITAL SIGNS:  Blood pressure 135/67, pulse 66, temperature 98.2 F (36.8 C), temperature source Oral, resp. rate 20, height 5\' 6"  (1.676 m), weight 132.2 kg (291 lb 6.4 oz), SpO2 97 %.  I/O:    Intake/Output Summary (Last 24 hours) at 09/23/2017 1201 Last data filed at 09/23/2017 0900 Gross per 24 hour  Intake 1700 ml  Output 500 ml  Net 1200 ml    PHYSICAL  EXAMINATION:   GENERAL:  72 y.o.-year-old obese patient lying in bed in no acute distress.  EYES: Pupils equal, round, reactive to light and accommodation. No scleral icterus. Extraocular muscles intact.  HEENT: Head atraumatic, normocephalic. Oropharynx and nasopharynx clear.  NECK:  Supple, no jugular venous distention. No thyroid enlargement, no tenderness.  LUNGS: Normal breath sounds bilaterally, no wheezing, rales, rhonchi. No use of accessory muscles of respiration.  CARDIOVASCULAR: S1, S2 normal. No murmurs, rubs, or gallops.  ABDOMEN: Soft, nontender, nondistended. Bowel sounds present. No organomegaly or mass.  EXTREMITIES: No cyanosis, clubbing, + 1 edema b/l.  Legs wrapped b/l in ACE bandage. Signs of Chronic Venous stasis b/l.    NEUROLOGIC: Cranial nerves II through XII are intact. No focal Motor or sensory deficits b/l.  Globally weak.  PSYCHIATRIC: The patient is alert and oriented x 3.  SKIN: No obvious rash, lesion, or ulcer.     DATA REVIEW:   CBC Recent Labs  Lab 09/23/17 0539  WBC 4.8  HGB 12.5*  HCT 35.7*  PLT 128*    Chemistries  Recent Labs  Lab 09/20/17 1226  09/23/17 0539  NA 135   < > 139  K 3.5   < > 2.9*  CL 100*   < > 100*  CO2 27   < > 31  GLUCOSE 201*   < > 108*  BUN 25*   < > 17  CREATININE 2.02*   < > 1.52*  CALCIUM 9.1   < > 8.4*  MG  --    < > 1.9  AST 26  --   --   ALT 22  --   --   ALKPHOS 85  --   --   BILITOT 1.2  --   --    < > = values in this interval not displayed.    Cardiac Enzymes No results for input(s): TROPONINI in the last 168 hours.  Microbiology Results  Results for orders placed or performed during the hospital encounter of 08/29/17  Urine culture     Status: None   Collection Time: 08/29/17  7:04 AM  Result Value Ref Range Status   Specimen Description   Final    URINE, CATHETERIZED Performed at [name], [place],  [place] [address]    Special Requests   Final     Normal Performed at [facility], [address] [place], [place] [address]    Culture   Final    NO GROWTH Performed at [facility], [place] [address] [place], [place] [address]    Report Status 08/30/2017 FINAL  Final  Body fluid culture     Status: None   Collection Time: 08/29/17  8:00 PM  Result Value Ref Range Status   Specimen Description   Final    SYNOVIAL RIGHT KNEE Performed at [facility], [address], [place], [place] [address]    Special Requests   Final    NONE Performed at [facility], [address] [place], [place] [address]    Gram Stain   Final    FEW WBC PRESENT, PREDOMINANTLY PMN NO ORGANISMS SEEN    Culture   Final    No growth aerobically or anaerobically. Performed at [facility], [place] [address], [place], [place] [address]    Report Status 09/02/2017 FINAL  Final    RADIOLOGY:  No results found.    Management plans discussed with the patient, family and they are in agreement.  CODE STATUS:  Code Status History    Date Active Date Inactive Code Status Order ID Comments User Context   08/29/2017 1210 08/31/2017 1755 Full Code [id number]  Salary, Avel Peace, MD Inpatient   TOTAL TIME TAKING CARE OF THIS PATIENT: 40 minutes.    Henreitta Leber M.D on 09/23/2017 at 12:01 PM  Between 7am to 6pm - Pager - [phone]  After 6pm go to [email or web address] - password [email or web address] [facility]  Office  [phone]  CC: Primary care physician; [name], MD

## 2017-09-23 NOTE — Progress Notes (Signed)
Discharge order received. Patient is alert and oriented. Vital signs stable . No signs of acute distress. Discharge instructions given. Patient verbalized understanding. No other issues noted at this time.   

## 2017-09-23 NOTE — Care Management (Signed)
Discharge to home today per Dr. Heron Sabins. Resumption of services + wound care. Will update [place] from [facility]. Family/friend will transport Shelbie Ammons RN MSN [place] [facility] [phone]

## 2017-09-23 NOTE — Evaluation (Signed)
Physical Therapy Evaluation Patient Details Name: Adam Merritt MRN: [id number] DOB: [birth year] Today's Date: 09/23/2017   History of Present Illness  72 yo male with onset of cellulitis on BLE's with wraps and acute renal failure, has been cleared for DVT.  Pt has PMHx:  hout, PAD, aplastic anemia, stroke, CABG, DM, CAD,   Clinical Impression  Pt is up to walk with assistance, and noted his endurance was affected by medical issues.  However, O2 sats were 97% with exertion and so will be seeing HHPT for work on tolerance of mobility.  Family is expecting to assist him and should be reasonable to transition there as pt can move mainly with support of non weight bearing activity.  Follow acutely as pt is here to increase strength and balance with endurance to increase safety with gait.    Follow Up Recommendations Home health PT    Equipment Recommendations  None recommended by PT    Recommendations for Other Services       Precautions / Restrictions Precautions Precautions: Fall(telemetry) Precaution Comments: history of R knee buckling Restrictions Weight Bearing Restrictions: No      Mobility  Bed Mobility Overal bed mobility: Needs Assistance Bed Mobility: Supine to Sit;Sit to Supine     Supine to sit: Min guard Sit to supine: Min guard   General bed mobility comments: uses bed rail and leveled foot of bed  Transfers Overall transfer level: Needs assistance Equipment used: Rolling walker (2 wheeled) Transfers: Sit to/from Stand Sit to Stand: Min guard         General transfer comment: used min guard in case pt needed to be assisted  Ambulation/Gait Ambulation/Gait assistance: Min guard Ambulation Distance (Feet): 150 Feet Assistive device: Rolling walker (2 wheeled) Gait Pattern/deviations: Step-through pattern;Decreased stride length Gait velocity: decreased per request of therapist (for safety) due to history of falls Gait velocity interpretation: <1.31  ft/sec, indicative of household ambulator General Gait Details: pt is able to be assisted on RW with cues for sitting down to rest briefly on the 150' walk  Stairs            Wheelchair Mobility    Modified Rankin (Stroke Patients Only)       Balance Overall balance assessment: Needs assistance Sitting-balance support: Feet supported Sitting balance-Leahy Scale: Good     Standing balance support: Bilateral upper extremity supported Standing balance-Leahy Scale: Fair                               Pertinent Vitals/Pain Pain Assessment: Faces Faces Pain Scale: Hurts little more Pain Location: RLE Pain Descriptors / Indicators: Sore Pain Intervention(s): Limited activity within patient's tolerance;Monitored during session;Premedicated before session;Repositioned    Home Living Family/patient expects to be discharged to:: Private residence Living Arrangements: Spouse/significant other Available Help at Discharge: Family;Available 24 hours/day Type of Home: House Home Access: Ramped entrance     Home Layout: One level Home Equipment: Walker - 4 wheels;Cane - single point      Prior Function Level of Independence: Independent with assistive device(s)         Comments: SPC outside and 4WW inside     Hand Dominance   Dominant Hand: Left    Extremity/Trunk Assessment   Upper Extremity Assessment Upper Extremity Assessment: Overall WFL for tasks assessed    Lower Extremity Assessment Lower Extremity Assessment: Overall WFL for tasks assessed    Cervical / Trunk Assessment Cervical /  Trunk Assessment: Normal  Communication   Communication: No difficulties  Cognition Arousal/Alertness: Awake/alert Behavior During Therapy: WFL for tasks assessed/performed Overall Cognitive Status: Within Functional Limits for tasks assessed                                        General Comments General comments (skin integrity, edema,  etc.): redness on skin with edema outside compression wraps on LE's    Exercises Other Exercises Other Exercises: strength on LE's is WFL at 4+ to 5   Assessment/Plan    PT Assessment Patient needs continued PT services  PT Problem List Decreased strength;Decreased range of motion;Decreased activity tolerance;Decreased balance;Decreased mobility;Decreased coordination;Decreased knowledge of use of DME;Decreased safety awareness;Cardiopulmonary status limiting activity;Obesity;Decreased skin integrity;Pain       PT Treatment Interventions DME instruction;Gait training;Functional mobility training;Stair training;Therapeutic activities;Therapeutic exercise;Balance training;Patient/family education    PT Goals (Current goals can be found in the Care Plan section)  Acute Rehab PT Goals Patient Stated Goal: to get home and feel stronger PT Goal Formulation: With patient Time For Goal Achievement: 10/07/17 Potential to Achieve Goals: Good    Frequency Min 2X/week   Barriers to discharge Other (comment)(will need to work on strength) HHPT recommended    Co-evaluation               AM-PAC PT "6 Clicks" Daily Activity  Outcome Measure Difficulty turning over in bed (including adjusting bedclothes, sheets and blankets)?: A Little Difficulty moving from lying on back to sitting on the side of the bed? : A Little Difficulty sitting down on and standing up from a chair with arms (e.g., wheelchair, bedside commode, etc,.)?: A Little Help needed moving to and from a bed to chair (including a wheelchair)?: A Little Help needed walking in hospital room?: A Little Help needed climbing 3-5 steps with a railing? : A Little 6 Click Score: 18    End of Session Equipment Utilized During Treatment: Gait belt Activity Tolerance: Patient tolerated treatment well Patient left: in bed;with call bell/phone within reach;with bed alarm set(asked pt to call for help to BR) Nurse Communication:  Mobility status PT Visit Diagnosis: Unsteadiness on feet (R26.81);History of falling (Z91.81);Difficulty in walking, not elsewhere classified (R26.2);Pain Pain - Right/Left: (B) Pain - part of body: Leg    Time: 0935-1001 PT Time Calculation (min) (ACUTE ONLY): 26 min   Charges:   PT Evaluation $PT Eval Moderate Complexity: 1 Mod PT Treatments $Gait Training: 8-22 mins   PT G Codes:   PT G-Codes **NOT FOR INPATIENT CLASS** Functional Assessment Tool Used: AM-PAC 6 Clicks Basic Mobility   Ramond Dial 09/23/2017, 1:25 PM   Mee Hives, PT MS Acute Rehab Dept. Number: [place] and [place]

## 2017-09-24 ENCOUNTER — Other Ambulatory Visit: Payer: Self-pay

## 2017-09-24 MED ORDER — FUROSEMIDE 40 MG PO TABS: ORAL_TABLET | ORAL | 3 refills | Status: DC

## 2017-09-25 ENCOUNTER — Encounter: Payer: Self-pay | Admitting: Internal Medicine

## 2017-09-25 ENCOUNTER — Ambulatory Visit (INDEPENDENT_AMBULATORY_CARE_PROVIDER_SITE_OTHER): Payer: Medicare Other | Admitting: Internal Medicine

## 2017-09-25 VITALS — BP 106/80 | HR 84 | Resp 16 | Ht 66.0 in | Wt 290.0 lb

## 2017-09-25 DIAGNOSIS — L02419 Cutaneous abscess of limb, unspecified: Secondary | ICD-10-CM

## 2017-09-25 DIAGNOSIS — L03119 Cellulitis of unspecified part of limb: Secondary | ICD-10-CM

## 2017-09-25 DIAGNOSIS — N183 Chronic kidney disease, stage 3 unspecified: Secondary | ICD-10-CM

## 2017-09-25 DIAGNOSIS — L270 Generalized skin eruption due to drugs and medicaments taken internally: Secondary | ICD-10-CM | POA: Diagnosis not present

## 2017-09-25 MED ORDER — AMOXICILLIN-POT CLAVULANATE 875-125 MG PO TABS: 1.0000 | ORAL_TABLET | Freq: Two times a day (BID) | ORAL | 0 refills | Status: DC

## 2017-09-25 NOTE — Progress Notes (Signed)
[facility] [place], [place] [address]  Internal MEDICINE  Office Visit Note  Patient Name: Adam Merritt  [id number]  [id number]  Date of Service: 09/25/2017  Chief Complaint  Patient presents with  . Hospitalization Follow-up    infection in both legs.  d/c on sunday    HPI  Pt is here for routine follow up. Pt was hospitalized with cellulitis of bilateral LE. He has textbook Lymphedema of his BLE. Pt was started on Doxy and was referred to wound care nurses on out patient basis. C/o rash and itching allover his body. He thinks he might be having allergies to this medicine. Potassium was found to be low. DM is under better control   Current Medication: Outpatient Encounter Medications as of 09/25/2017  Medication Sig Note  . albuterol (PROVENTIL HFA;VENTOLIN HFA) 108 (90 Base) MCG/ACT inhaler Inhale 1 puff into the lungs every 6 (six) hours as needed for wheezing or shortness of breath.   Marland Kitchen amoxicillin-clavulanate (AUGMENTIN) 875-125 MG tablet Take 1 tablet by mouth 2 (two) times daily.   Marland Kitchen aspirin 325 MG EC tablet Take 325 mg by mouth daily.   Marland Kitchen atorvastatin (LIPITOR) 40 MG tablet Take 40 mg by mouth at bedtime.    . carvedilol (COREG) 25 MG tablet Take 25 mg by mouth 2 (two) times daily with a meal.   . colchicine 0.6 MG tablet Take 1 tablet (0.6 mg total) by mouth daily.   . febuxostat (ULORIC) 40 MG tablet Take one tab a day for gout, cannot take allopurinol (Patient taking differently: Take 40 mg by mouth daily. )   . fentaNYL (DURAGESIC - DOSED MCG/HR) 50 MCG/HR Place 1 patch (50 mcg total) onto the skin every 3 (three) days. 09/20/2017: Patient currently NOT wearing patch. Patient's wife has a patch  . furosemide (LASIX) 40 MG tablet Take one tablet (40mg ) by mouth daily   . gabapentin (NEURONTIN) 400 MG capsule Take 400 mg by mouth 3 (three) times daily.    . hydrALAZINE (APRESOLINE) 25 MG tablet Take 25 mg by mouth 3 (three) times daily.   Marland Kitchen  ibuprofen (ADVIL,MOTRIN) 600 MG tablet Take 600 mg by mouth 2 (two) times daily as needed for mild pain or moderate pain.    . indapamide (LOZOL) 2.5 MG tablet Take 1 tablet (2.5 mg total) by mouth daily.   . Insulin Glargine (BASAGLAR KWIKPEN) 100 UNIT/ML SOPN Inject 30 Units into the skin at bedtime.   Marland Kitchen ipratropium-albuterol (DUONEB) 0.5-2.5 (3) MG/3ML SOLN Take 3 mLs by nebulization every 4 (four) hours as needed (for shortness of breath).   . Liraglutide (VICTOZA) 18 MG/3ML SOPN Inject 1.8 mg into the skin daily.    . Multiple Vitamin (MULTIVITAMIN WITH MINERALS) TABS tablet Take 1 tablet by mouth daily.   Marland Kitchen omeprazole (PRILOSEC) 20 MG capsule Take 20 mg by mouth daily.   . Oxycodone HCl 10 MG TABS Take 1 tablet (10 mg total) by mouth 3 (three) times daily as needed.   . pentoxifylline (TRENTAL) 400 MG CR tablet Take 400 mg by mouth 3 (three) times daily.   . sennosides-docusate sodium (SENOKOT-S) 8.6-50 MG tablet Take 1-2 tablets by mouth 2 (two) times daily.   Marland Kitchen UREA 20 INTENSIVE HYDRATING 20 % cream USE AS DIRECTED 3 TIMES A WEEK   . zolpidem (AMBIEN CR) 12.5 MG CR tablet Take 1 tablet (12.5 mg total) by mouth at bedtime as needed for sleep.   . [DISCONTINUED] doxycycline (VIBRAMYCIN) 100  MG capsule Take 1 capsule (100 mg total) by mouth 2 (two) times daily for 7 days. 09/25/2017: Hives    No facility-administered encounter medications on file as of 09/25/2017.     Surgical History: Past Surgical History:  Procedure Laterality Date  . CHOLECYSTECTOMY    . CORONARY ARTERY BYPASS GRAFT      Medical History: Past Medical History:  Diagnosis Date  . Anemia   . CAD (coronary artery disease)   . Chest pain   . Coronary artery disease   . Diabetes mellitus without complication ([place])   . Esophageal reflux   . Herpes zoster without mention of complication   . Hyperlipidemia   . Hypertension   . Insomnia   . Lumbago   . Neuropathy in diabetes ([place])   . Obstructive chronic  bronchitis without exacerbation ([place])   . Other malaise and fatigue   . Stroke ([place])   . Varicose veins     Family History: Family History  Problem Relation Age of Onset  . Diabetes Mother   . Hyperlipidemia Mother   . Hypertension Mother   . Diabetes Father   . Hyperlipidemia Father   . Hypertension Father     Social History   Socioeconomic History  . Marital status: Married    Spouse name: Not on file  . Number of children: Not on file  . Years of education: Not on file  . Highest education level: Not on file  Occupational History  . Not on file  Social Needs  . Financial resource strain: Not on file  . Food insecurity:    Worry: Not on file    Inability: Not on file  . Transportation needs:    Medical: Not on file    Non-medical: Not on file  Tobacco Use  . Smoking status: Never Smoker  . Smokeless tobacco: Never Used  Substance and Sexual Activity  . Alcohol use: No  . Drug use: No  . Sexual activity: Not on file  Lifestyle  . Physical activity:    Days per week: Not on file    Minutes per session: Not on file  . Stress: Not on file  Relationships  . Social connections:    Talks on phone: Not on file    Gets together: Not on file    Attends religious service: Not on file    Active member of club or organization: Not on file    Attends meetings of clubs or organizations: Not on file    Relationship status: Not on file  . Intimate partner violence:    Fear of current or ex partner: Not on file    Emotionally abused: Not on file    Physically abused: Not on file    Forced sexual activity: Not on file  Other Topics Concern  . Not on file  Social History Narrative  . Not on file   Review of Systems  Constitutional: Negative for chills, fatigue and unexpected weight change.       NO SHOES  HENT: Negative for congestion, postnasal drip, rhinorrhea, sneezing and sore throat.   Eyes: Negative for redness.  Respiratory: Negative for cough, chest  tightness and shortness of breath.   Cardiovascular: Positive for leg swelling. Negative for chest pain and palpitations.       Lymphedema   Gastrointestinal: Negative for abdominal pain, constipation, diarrhea, nausea and vomiting.  Genitourinary: Negative for dysuria and frequency.  Musculoskeletal: Negative for arthralgias, back pain, joint swelling and neck  pain.  Skin: Positive for rash.       Skin breakdown BLE  Neurological: Negative.  Negative for tremors and numbness.  Hematological: Negative for adenopathy. Does not bruise/bleed easily.  Psychiatric/Behavioral: Negative for behavioral problems (Depression), sleep disturbance and suicidal ideas. The patient is not nervous/anxious.    Vital Signs: BP 106/80 (BP Location: Left Arm, Patient Position: Sitting)   Pulse 84   Resp 16   Ht 5\' 6"  (1.676 m)   Wt 290 lb (131.5 kg)   SpO2 94%   BMI 46.81 kg/m    Physical Exam  Constitutional:  OBESE   HENT:  Head: Normocephalic and atraumatic.  Eyes: EOM are normal.  Neck: Normal range of motion.  Cardiovascular:  Lymphedema and rash with skin breakdown and oozing   Pulmonary/Chest: Effort normal and breath sounds normal.  Abdominal: Soft.  Skin: Rash noted.  Maculopapular rash in skin folds, torso consistent with drug rash  Psychiatric: He has a normal mood and affect.   Assessment/Plan: 1. Cellulitis and abscess of lower extremity - Start amoxicillin-clavulanate (AUGMENTIN) 875-125 MG tablet; Take 1 tablet by mouth 2 (two) times daily.  Dispense: 40 tablet; Refill: 0. Home health nurse to visit   2. Chronic kidney disease, stage III (moderate) (HCC) - Basic metabolic panel  3. Drug-induced skin rash - DC Doxy. Allergies are updated  General Counseling: Hosea verbalizes understanding of the findings of todays visit and agrees with plan of treatment. I have discussed any further diagnostic evaluation that may be needed or ordered today. We also reviewed his medications  today. he has been encouraged to call the office with any questions or concerns that should arise related to todays visit.  Orders Placed This Encounter  Procedures  . Basic metabolic panel    Meds ordered this encounter  Medications  . amoxicillin-clavulanate (AUGMENTIN) 875-125 MG tablet    Sig: Take 1 tablet by mouth 2 (two) times daily.    Dispense:  40 tablet    Refill:  0    Time spent:25 Minutes   Dr [name] Internal medicine

## 2017-09-27 ENCOUNTER — Telehealth: Payer: Self-pay

## 2017-09-27 NOTE — Telephone Encounter (Signed)
GAVE VERBAL ORDER T[facility]ELCARE PT ALDRIN [phone] FOR MR Moffitt PT TWICE A WEEK FOR 5 WEEK AS PER HEATHER

## 2017-09-28 ENCOUNTER — Telehealth: Payer: Self-pay

## 2017-09-28 NOTE — Telephone Encounter (Signed)
Pt's wife called and advised that pt was still having a lot of itching.  I spoke to [facility] and she advised for pt to take Claritin and also can do benadryl.  She advised that it was going to take a while for the rash to clear.  I spoke back to pt and told him what DFK said.  dbs

## 2017-10-15 ENCOUNTER — Other Ambulatory Visit: Payer: Self-pay | Admitting: Nurse Practitioner

## 2017-10-15 ENCOUNTER — Telehealth: Payer: Self-pay

## 2017-10-15 DIAGNOSIS — J209 Acute bronchitis, unspecified: Secondary | ICD-10-CM

## 2017-10-15 MED ORDER — LEVOFLOXACIN 500 MG PO TABS: 500.0000 mg | ORAL_TABLET | Freq: Every day | ORAL | 0 refills | Status: DC

## 2017-10-15 NOTE — Telephone Encounter (Signed)
Pt advised of abx sent to pharmacy and to make appt if no better.  dbs

## 2017-10-15 NOTE — Telephone Encounter (Signed)
Patient called office c/o bronchitis type symptoms. Sent in rx for levofloxacin 500mg  daily for next 10 days. Use nebulizer treatments as needed for SOB. Schedule appointment if no improvement in next few days

## 2017-10-15 NOTE — Progress Notes (Signed)
Patient called office c/o bronchitis type symptoms. Sent in rx for levofloxacin 500mg  daily for next 10 days. Use nebulizer treatments as needed for SOB. Schedule appointment if no improvement in next few days

## 2017-10-18 ENCOUNTER — Encounter: Payer: Medicare Other | Attending: Nurse Practitioner | Admitting: Nurse Practitioner

## 2017-10-18 DIAGNOSIS — I509 Heart failure, unspecified: Secondary | ICD-10-CM | POA: Diagnosis not present

## 2017-10-18 DIAGNOSIS — L97822 Non-pressure chronic ulcer of other part of left lower leg with fat layer exposed: Secondary | ICD-10-CM | POA: Diagnosis not present

## 2017-10-18 DIAGNOSIS — E11622 Type 2 diabetes mellitus with other skin ulcer: Secondary | ICD-10-CM | POA: Insufficient documentation

## 2017-10-18 DIAGNOSIS — Z794 Long term (current) use of insulin: Secondary | ICD-10-CM | POA: Diagnosis not present

## 2017-10-18 DIAGNOSIS — I2510 Atherosclerotic heart disease of native coronary artery without angina pectoris: Secondary | ICD-10-CM | POA: Insufficient documentation

## 2017-10-18 DIAGNOSIS — I890 Lymphedema, not elsewhere classified: Secondary | ICD-10-CM | POA: Diagnosis not present

## 2017-10-18 DIAGNOSIS — L97812 Non-pressure chronic ulcer of other part of right lower leg with fat layer exposed: Secondary | ICD-10-CM | POA: Diagnosis not present

## 2017-10-18 DIAGNOSIS — J449 Chronic obstructive pulmonary disease, unspecified: Secondary | ICD-10-CM | POA: Diagnosis not present

## 2017-10-18 DIAGNOSIS — I110 Hypertensive heart disease with heart failure: Secondary | ICD-10-CM | POA: Diagnosis not present

## 2017-10-18 DIAGNOSIS — Z6841 Body Mass Index (BMI) 40.0 and over, adult: Secondary | ICD-10-CM | POA: Diagnosis not present

## 2017-10-18 DIAGNOSIS — E1140 Type 2 diabetes mellitus with diabetic neuropathy, unspecified: Secondary | ICD-10-CM | POA: Diagnosis not present

## 2017-10-18 DIAGNOSIS — E669 Obesity, unspecified: Secondary | ICD-10-CM | POA: Diagnosis not present

## 2017-10-18 DIAGNOSIS — Z8673 Personal history of transient ischemic attack (TIA), and cerebral infarction without residual deficits: Secondary | ICD-10-CM | POA: Insufficient documentation

## 2017-10-18 DIAGNOSIS — G4730 Sleep apnea, unspecified: Secondary | ICD-10-CM | POA: Diagnosis not present

## 2017-10-18 DIAGNOSIS — Z9221 Personal history of antineoplastic chemotherapy: Secondary | ICD-10-CM | POA: Diagnosis not present

## 2017-10-18 DIAGNOSIS — L97222 Non-pressure chronic ulcer of left calf with fat layer exposed: Secondary | ICD-10-CM | POA: Diagnosis present

## 2017-10-20 NOTE — Progress Notes (Signed)
SEITH, AIKEY ([id number]) Visit Report for 10/18/2017 Abuse/Suicide Risk Screen Details Patient Name: Adam Merritt, Adam Merritt Date of Service: 10/18/2017 9:45 AM Medical Record Number: [id number] Patient Account Number: [id number] Date of Birth/Sex: [birth year] (72 y.o. M) Treating RN: Montey Hora Primary Care Adiya Selmer: [name] Other Clinician: Referring Jaydynn Wolford: Referral, Self Treating Bryli Mantey/Extender: Cathie Olden in Treatment: 0 Abuse/Suicide Risk Screen Items Answer ABUSE/SUICIDE RISK SCREEN: Has anyone close to you tried to hurt or harm you recentlyo No Do you feel uncomfortable with anyone in your familyo No Has anyone forced you do things that you didnot want to doo No Do you have any thoughts of harming yourselfo No Patient displays signs or symptoms of abuse and/or neglect. No Electronic Signature(s) Signed: 10/18/2017 4:44:42 PM By: Montey Hora Entered By: Montey Hora on 10/18/2017 10:18:40 Adam Merritt ([id number]) -------------------------------------------------------------------------------- Activities of Daily Living Details Patient Name: Adam Merritt Date of Service: 10/18/2017 9:45 AM Medical Record Number: [id number] Patient Account Number: [id number] Date of Birth/Sex: 1946/04/01 (72 y.o. M) Treating RN: Montey Hora Primary Care Ivy Puryear: [name] Other Clinician: Referring Xyon Lukasik: Referral, Self Treating Marquavis Hannen/Extender: Cathie Olden in Treatment: 0 Activities of Daily Living Items Answer Activities of Daily Living (Please select one for each item) Drive Automobile Completely Able Take Medications Completely Able Use Telephone Completely [place] Number One for Appearance Need Assistance Use Toilet Completely Able Bath / Shower Need Assistance Dress Self Need Assistance Feed Self Completely Able Walk Need Assistance Get In / Out Bed Completely [place] for Self Completely Able Electronic Signature(s) Signed: 10/18/2017 4:44:42 PM By: Montey Hora Entered By: Montey Hora on 10/18/2017 10:19:15 Adam Merritt ([id number]) -------------------------------------------------------------------------------- Education Assessment Details Patient Name: Adam Merritt Date of Service: 10/18/2017 9:45 AM Medical Record Number: [id number] Patient Account Number: [id number] Date of Birth/Sex: Nov 10, 1945 (72 y.o. M) Treating RN: Montey Hora Primary Care Deondrea Aguado: [name] Other Clinician: Referring Debby Clyne: Referral, Self Treating Loys Shugars/Extender: Cathie Olden in Treatment: 0 Primary Learner Assessed: Caregiver HHRN Reason Patient is not Primary Learner: wound location Learning Preferences/Education Level/Primary Language Learning Preference: Explanation, Demonstration, Printed Material Highest Education Level: High School Preferred Language: English Cognitive Barrier Assessment/Beliefs Language Barrier: No Translator Needed: No Memory Deficit: No Emotional Barrier: No Cultural/Religious Beliefs Affecting Medical Care: No Physical Barrier Assessment Impaired Vision: No Impaired Hearing: No Decreased Hand dexterity: No Knowledge/Comprehension Assessment Knowledge Level: Medium Comprehension Level: Medium Ability to understand written Medium instructions: Ability to understand verbal Medium instructions: Motivation Assessment Anxiety Level: Calm Cooperation: Cooperative Education Importance: Acknowledges Need Interest in Health Problems: Asks Questions Perception: Coherent Willingness to Engage in Self- Medium Management Activities: Readiness to Engage in Self- Medium Management Activities: Electronic Signature(s) Signed: 10/18/2017 4:44:42 PM By: Montey Hora Entered By: Montey Hora on 10/18/2017 10:19:46 Adam Merritt  ([id number]) -------------------------------------------------------------------------------- Fall Risk Assessment Details Patient Name: Adam Merritt Date of Service: 10/18/2017 9:45 AM Medical Record Number: [id number] Patient Account Number: [id number] Date of Birth/Sex: May 10, 1946 (72 y.o. M) Treating RN: Montey Hora Primary Care Lanya Bucks: [name] Other Clinician: Referring Tya Haughey: Referral, Self Treating Dael Howland/Extender: Cathie Olden in Treatment: 0 Fall Risk Assessment Items Have you had 2 or more falls in the last 12 monthso 0 Yes Have you had any fall that resulted in injury in the last 12 monthso 0 No FALL RISK ASSESSMENT: History of falling - immediate or within 3 months 25 Yes Secondary diagnosis 0 No Ambulatory aid None/bed rest/wheelchair/nurse  0 No Crutches/cane/walker 15 Yes Furniture 0 No IV Access/Saline Lock 0 No Gait/Training Normal/bed rest/immobile 0 No Weak 10 Yes Impaired 0 No Mental Status Oriented to own ability 0 Yes Electronic Signature(s) Signed: 10/18/2017 4:44:42 PM By: Montey Hora Entered By: Montey Hora on 10/18/2017 10:20:13 Adam Merritt ([id number]) -------------------------------------------------------------------------------- Foot Assessment Details Patient Name: Adam Merritt Date of Service: 10/18/2017 9:45 AM Medical Record Number: [id number] Patient Account Number: [id number] Date of Birth/Sex: 1946/05/04 (72 y.o. M) Treating RN: Montey Hora Primary Care Lashaundra Lehrmann: [name] Other Clinician: Referring Jakobee Brackins: Referral, Self Treating Devani Odonnel/Extender: Cathie Olden in Treatment: 0 Foot Assessment Items Site Locations + = Sensation present, - = Sensation absent, C = Callus, U = Ulcer R = Redness, W = Warmth, M = Maceration, PU = Pre-ulcerative lesion F = Fissure, S = Swelling, D = Dryness Assessment Right: Left: Other Deformity: No No Prior Foot Ulcer: No No Prior Amputation: No No Charcot  Joint: No No Ambulatory Status: Ambulatory With Help Assistance Device: Cane Gait: Steady Electronic Signature(s) Signed: 10/18/2017 4:44:42 PM By: Montey Hora Entered By: Montey Hora on 10/18/2017 10:21:31 Adam Merritt ([id number]) -------------------------------------------------------------------------------- Nutrition Risk Assessment Details Patient Name: Adam Merritt Date of Service: 10/18/2017 9:45 AM Medical Record Number: [id number] Patient Account Number: [id number] Date of Birth/Sex: 04/21/1946 (72 y.o. M) Treating RN: Montey Hora Primary Care Solita Macadam: [name] Other Clinician: Referring Bethanny Toelle: Referral, Self Treating Liliana Brentlinger/Extender: Cathie Olden in Treatment: 0 Height (in): 66 Weight (lbs): 298 Body Mass Index (BMI): 48.1 Nutrition Risk Assessment Items NUTRITION RISK SCREEN: I have an illness or condition that made me change the kind and/or amount of 0 No food I eat I eat fewer than two meals per day 0 No I eat few fruits and vegetables, or milk products 0 No I have three or more drinks of beer, liquor or wine almost every day 0 No I have tooth or mouth problems that make it hard for me to eat 0 No I don't always have enough money to buy the food I need 0 No I eat alone most of the time 0 No I take three or more different prescribed or over-the-counter drugs a day 1 Yes Without wanting to, I have lost or gained 10 pounds in the last six months 0 No I am not always physically able to shop, cook and/or feed myself 0 No Nutrition Protocols Good Risk Protocol 0 No interventions needed Moderate Risk Protocol Electronic Signature(s) Signed: 10/18/2017 4:44:42 PM By: Montey Hora Entered By: Montey Hora on 10/18/2017 10:20:26

## 2017-10-21 NOTE — Progress Notes (Addendum)
MOUNIR, SKIPPER ([id number]) Visit Report for 10/18/2017 Chief Complaint Document Details Patient Name: Adam Merritt, Adam Merritt Date of Service: 10/18/2017 9:45 AM Medical Record Number: [id number] Patient Account Number: [id number] Date of Birth/Sex: [birth year] (72 y.o. M) Treating RN: Ahmed Prima Primary Care Provider: [name] Other Clinician: Referring Provider: [name] Treating Provider/Extender: Cathie Olden in Treatment: 0 Information Obtained from: Patient Chief Complaint he is here for evaluation of bilateral lower extremity lymphedema/venous ulcers Electronic Signature(s) Signed: 10/18/2017 1:08:29 PM By: Lawanda Cousins Entered By: Lawanda Cousins on 10/18/2017 13:08:29 Adam Merritt ([id number]) -------------------------------------------------------------------------------- HPI Details Patient Name: Adam Merritt Date of Service: 10/18/2017 9:45 AM Medical Record Number: [id number] Patient Account Number: [id number] Date of Birth/Sex: Jun 09, 1945 (72 y.o. M) Treating RN: Ahmed Prima Primary Care Provider: [name] Other Clinician: Referring Provider: [name] Treating Provider/Extender: Cathie Olden in Treatment: 0 History of Present Illness Location: bilateral lower extremity Quality: no pain to ulcerations Duration: ulcerations have been present 3-4 weeks Context: ulcerations of secondary to edema, blister formation HPI Description: 10/18/17-He is here for initial evaluation of bilateral lower extremity ulcerations in the presence of venous insufficiency and lymphedema. He has been seen by vascular medicine in the past, Dr. Lucky Cowboy, last seen in 2016. He does have a history of abnormal ABIs, which is to be expected given his lymphedema and venous insufficiency. According to Epic, it appears that all attempts for arterial evaluation and/or angiography were not follow through with by patient. He does have a history of being seen in lymphedema clinic in  2018, stopped going approximately 6 months ago stating "it didn't do any good". He does not have lymphedema pumps, he does not have custom fit compression wrap/stockings. He is diabetic and his recent A1c last month was 7.6. He admits to chronic bilateral lower extremity pain, no change in pain since blister and ulceration development. He is currently being treated with Levaquin for bronchitis. He has home health and we will continue. Electronic Signature(s) Signed: 10/25/2017 8:04:42 AM By: Lawanda Cousins Previous Signature: 10/18/2017 1:22:58 PM Version By: Lawanda Cousins Previous Signature: 10/18/2017 1:10:19 PM Version By: Lawanda Cousins Entered By: Lawanda Cousins on 10/25/2017 08:04:42 Adam Merritt ([id number]) -------------------------------------------------------------------------------- Physical Exam Details Patient Name: Adam Merritt Date of Service: 10/18/2017 9:45 AM Medical Record Number: [id number] Patient Account Number: [id number] Date of Birth/Sex: [birth year] (72 y.o. M) Treating RN: Ahmed Prima Primary Care Provider: [name] Other Clinician: Referring Provider: [name] Treating Provider/Extender: Lawanda Cousins [name]: 0 Respiratory respirations are even and unlabored. Cardiovascular s1 s2 regular rate and rhythm. BLE-nonpalpable pedal pulses; multiphasic Doppler signal to bilateral DP, PT and pedal arch. BLE- fibrotic skin changes consistent with lymphedema; hyperpigmentation, dry, warm to touch. Musculoskeletal ambulates with cane. Psychiatric appears to have poor insight and/or judgement. oriented x4. calm, cooperative. Electronic Signature(s) Signed: 10/18/2017 1:24:36 PM By: Lawanda Cousins Entered By: Lawanda Cousins on 10/18/2017 13:24:36 Adam Merritt ([id number]) -------------------------------------------------------------------------------- Physician Orders Details Patient Name: Adam Merritt Date of Service: 10/18/2017 9:45 AM Medical  Record Number: [id number] Patient Account Number: [id number] Date of Birth/Sex: Feb 08, 1946 (72 y.o. M) Treating RN: Ahmed Prima Primary Care Provider: [name] Other Clinician: Referring Provider: [name] Treating Provider/Extender: Cathie Olden in Treatment: 0 Verbal / Phone Orders: Yes Clinician: Pinkerton, Debi Read Back and Verified: Yes Diagnosis Coding Wound Cleansing Wound #10 Left,Anterior Lower Leg o Clean wound with Normal Saline. o Cleanse wound with mild soap and water Wound #11 Left,Medial Lower Leg o  Clean wound with Normal Saline. o Cleanse wound with mild soap and water Wound #12 Left,Posterior Lower Leg o Clean wound with Normal Saline. o Cleanse wound with mild soap and water Wound #9 Right,Anterior Lower Leg o Clean wound with Normal Saline. o Cleanse wound with mild soap and water Anesthetic (add to Medication List) Wound #10 Left,Anterior Lower Leg o Topical Lidocaine 4% cream applied to wound bed prior to debridement (In Clinic Only). Wound #11 Left,Medial Lower Leg o Topical Lidocaine 4% cream applied to wound bed prior to debridement (In Clinic Only). Wound #12 Left,Posterior Lower Leg o Topical Lidocaine 4% cream applied to wound bed prior to debridement (In Clinic Only). Wound #9 Right,Anterior Lower Leg o Topical Lidocaine 4% cream applied to wound bed prior to debridement (In Clinic Only). Skin Barriers/Peri-Wound Care o Moisturizing lotion - not on wounds Primary Wound Dressing Wound #10 Left,Anterior Lower Leg o Hydrafera Blue Ready Transfer - upon removing from wound moisten with warm soapy water Wound #11 Left,Medial Lower Leg o Hydrafera Blue Ready Transfer - upon removing from wound moisten with warm soapy water Wound #12 Left,Posterior Lower Leg o Hydrafera Blue Ready Transfer - upon removing from wound moisten with warm soapy water Wound #9 Right,Anterior Lower Leg o Hydrafera Blue Ready  Transfer - upon removing from wound moisten with warm soapy water Adam Merritt, Adam Merritt ([id number]) Secondary Dressing Wound #10 Left,Anterior Lower Leg o ABD pad Wound #11 Left,Medial Lower Leg o ABD pad Wound #12 Left,Posterior Lower Leg o ABD pad Wound #9 Right,Anterior Lower Leg o ABD pad Dressing Change Frequency Wound #10 Left,Anterior Lower Leg o Dressing is to be changed Monday and Thursday. - start next week o Other: - Please go out to see pt tomorrow (Friday) 10/19/17 to change wraps Wound #11 Left,Medial Lower Leg o Dressing is to be changed Monday and Thursday. - start next week o Other: - Please go out to see pt tomorrow (Friday) 10/19/17 to change wraps Wound #12 Left,Posterior Lower Leg o Dressing is to be changed Monday and Thursday. - start next week o Other: - Please go out to see pt tomorrow (Friday) 10/19/17 to change wraps Wound #9 Right,Anterior Lower Leg o Dressing is to be changed Monday and Thursday. - start next week o Other: - Please go out to see pt tomorrow (Friday) 10/19/17 to change wraps Follow-up Appointments Wound #10 Left,Anterior Lower Leg o Return Appointment in 1 week. Wound #11 Left,Medial Lower Leg o Return Appointment in 1 week. Wound #12 Left,Posterior Lower Leg o Return Appointment in 1 week. Wound #9 Right,Anterior Lower Leg o Return Appointment in 1 week. Edema Control Wound #10 Left,Anterior Lower Leg o 3 Layer Compression System - Bilateral - unna to anchor Wound #11 Left,Medial Lower Leg o 3 Layer Compression System - Bilateral - unna to anchor Wound #12 Left,Posterior Lower Leg o 3 Layer Compression System - Bilateral - unna to anchor Wound #9 Right,Anterior Lower Leg o 3 Layer Compression System - Bilateral - unna to anchor Adam Merritt, Adam Merritt ([id number]) Additional Orders / Instructions Wound #10 Left,Anterior Lower Leg o Increase protein intake. Wound #11 Left,Medial Lower Leg o  Increase protein intake. Wound #12 Left,Posterior Lower Leg o Increase protein intake. Wound #9 Right,Anterior Lower Leg o Increase protein intake. Home Health Wound #10 [place] Visits - Please go out to see pt tomorrow (Friday) 10/19/17 to change wraps Wound #11 Left,Medial Lower Leg o [place] Visits - Please go out to  see pt tomorrow (Friday) 10/19/17 to change wraps Wound #12 Left,Posterior Lower Leg o [place] Visits - Please go out to see pt tomorrow (Friday) 10/19/17 to change wraps Wound #9 Right,Anterior Lower Leg o [place] Visits - Please go out to see pt tomorrow (Friday) 10/19/17 to change wraps Services and Therapies o Arterial Studies- Bilateral - AVVS Patient Medications Allergies: Corticosteroids (Glucocorticoids) Notifications Medication Indication Start End lidocaine DOSE 1 - topical 4 % cream - 1 cream topical Electronic Signature(s) Signed: 10/18/2017 5:24:28 PM By: Lawanda Cousins Signed: 10/19/2017 4:38:26 PM By: Alric Quan Entered By: Alric Quan on 10/18/2017 11:05:22 Adam Merritt ([id number]) -------------------------------------------------------------------------------- Prescription 10/18/2017 Patient Name: Adam Merritt Provider: Lawanda Cousins NP Date of Birth: 1945-07-10 NPI#: [id number] Sex: M DEA#: [id number] Phone #: [phone] License #: Patient Address: [facility] [place], [place] [address] [address], [place], [place] [address] [phone] Allergies Corticosteroids (Glucocorticoids) Medication Medication: Route: Strength: Form: lidocaine 4 % topical cream topical 4% cream Class: TOPICAL LOCAL ANESTHETICS Dose: Frequency / Time: Indication: 1 1 cream topical Number of Refills: Number of Units: 0 Generic Substitution: Start Date: End  Date: One Time Use: Substitution Permitted No Note to Pharmacy: Signature(s): Date(s): Electronic Signature(s) Signed: 10/18/2017 5:24:28 PM By: Lawanda Cousins Signed: 10/19/2017 4:38:26 PM By: Alric Quan Entered By: Alric Quan on 10/18/2017 11:05:26 Adam Merritt ([id number]) --------------------------------------------------------------------------------  Problem List Details Patient Name: Adam Merritt Date of Service: 10/18/2017 9:45 AM Medical Record Number: [id number] Patient Account Number: [id number] Date of Birth/Sex: [birth year] (73 y.o. 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 M) Treating RN: Ahmed Prima Primary Care Provider: [name] Other Clinician: Referring Provider: [name] Treating Provider/Extender: Cathie Olden in Treatment: 0 Subjective Chief Complaint Information obtained from Patient he is here for evaluation of bilateral lower extremity lymphedema/venous  ulcers History of Present Illness (HPI) The following HPI elements were documented for the patient's wound: Location: bilateral lower extremity Quality: no pain to ulcerations Duration: ulcerations have been present 3-4 weeks Context: ulcerations of secondary to edema, blister formation 10/18/17-He is here for initial evaluation of bilateral lower extremity ulcerations in the presence of venous insufficiency and lymphedema. He has been seen by vascular medicine in the past, Dr. Lucky Cowboy, last seen in 2016. He does have a history of abnormal ABIs, which is to be expected given his lymphedema and venous insufficiency. According to Epic, it appears that all attempts for arterial evaluation and/or angiography were not follow through with by patient. He does have a history of being seen in lymphedema clinic in 2018, stopped going approximately 6 months ago stating "it didn't do any good". He does not have lymphedema pumps, he does not have custom fit compression  wrap/stockings. He is diabetic and his recent A1c last month was 7.6. He admits to chronic bilateral lower extremity pain, no change in pain since blister and ulceration development. He is currently being treated with Levaquin for bronchitis. He has home health and we will continue. Wound History Patient presents with 3 open wounds that have been present for approximately 4 weeks. Patient has been treating wounds in the following manner: kerlix and ace wraps. Laboratory tests have not been performed in the last month. Patient reportedly has not tested positive for an antibiotic resistant organism. Patient reportedly has not tested positive for osteomyelitis. Patient reportedly has had testing performed to evaluate circulation in the legs. Patient experiences the following problems associated with their wounds: swelling. Patient History Information obtained from Patient. Allergies Corticosteroids (Glucocorticoids) Family History Cancer -  Paternal Grandparents, Kidney Disease - Siblings, Lung Disease - Mother, No family history of Diabetes, Heart Disease, Hereditary Spherocytosis, Hypertension, Seizures, Stroke, Thyroid Problems, Tuberculosis. Social History Never smoker, Marital Status - Married, Alcohol Use - Never, Drug Use - No History, Caffeine Use - Never. Medical History Hospitalization/Surgery History - 11/03/2013, [facility], bronchitis. Adam Merritt, Adam Merritt ([id number]) Medical And Surgical History Notes Respiratory home O2 at night as needed Cardiovascular varicose veins Neurologic CVA - 1992 Review of Systems (ROS) Eyes Denies complaints or symptoms of Dry Eyes, Vision Changes, Glasses / Contacts. Ear/Nose/Mouth/Throat Denies complaints or symptoms of Difficult clearing ears, Sinusitis. Hematologic/Lymphatic Denies complaints or symptoms of Bleeding / Clotting Disorders, Human Immunodeficiency Virus. Respiratory Denies complaints or symptoms of Chronic or frequent coughs, Shortness of Breath. Cardiovascular Denies complaints or symptoms of Chest pain, LE edema. Gastrointestinal Denies complaints or symptoms of Frequent diarrhea, Nausea, Vomiting. Endocrine Denies complaints or symptoms of Hepatitis, Thyroid disease, Polydypsia (Excessive Thirst). Immunological Denies complaints or symptoms of Hives, Itching. Integumentary (Skin) Complains or has symptoms of Wounds, Swelling. Denies complaints or symptoms of Bleeding or bruising tendency, Breakdown. Musculoskeletal Complains or has symptoms of Muscle Weakness. Denies complaints or symptoms of Muscle Pain. Neurologic Denies complaints or symptoms of Numbness/parasthesias, Focal/Weakness. Psychiatric Denies complaints or symptoms of Anxiety, Claustrophobia. Objective Constitutional Vitals Time Taken: 10:08 AM, Height: 66 in, Source: Measured, Weight: 298 lbs, Source: Measured, BMI: 48.1, Temperature: 97.9 F, Pulse: 76 bpm, Respiratory Rate: 20 breaths/min,  Blood Pressure: 141/65 mmHg. Respiratory respirations are even and unlabored. Cardiovascular s1 s2 regular rate and rhythm. BLE-nonpalpable pedal pulses; multiphasic Doppler signal to bilateral DP, PT and pedal arch. BLE- fibrotic skin changes consistent with lymphedema; hyperpigmentation, dry, warm to touch. Adam Merritt, Adam Merritt ([id number]) Musculoskeletal ambulates with cane. Psychiatric appears to have poor insight and/or judgement. oriented x4. calm, cooperative. Integumentary (Hair, Skin) Wound #10 status is Open. Original cause of wound was Blister. The wound is located on the Left,Anterior Lower Leg. The wound measures 0.9cm length x 4cm width x 0.1cm depth; 2.827cm^2 area and 0.283cm^3 volume. There is Fat Layer (Subcutaneous Tissue) Exposed exposed. There is no tunneling or undermining noted. There is a medium amount of serous drainage noted. The wound margin is flat and intact. There is medium (34-66%) red granulation within the wound bed. There is a medium (34-66%) amount of necrotic tissue within the wound bed including Adherent Slough. The periwound skin appearance exhibited: Hemosiderin Staining. The periwound skin appearance did not exhibit: Callus, Crepitus, Excoriation, Induration, Rash, Scarring, Dry/Scaly, Maceration, Atrophie Blanche, Cyanosis, Ecchymosis, Mottled, Pallor, Rubor, Erythema. Periwound temperature was noted as No Abnormality. The periwound has tenderness on palpation. Wound #11 status is Open. Original cause of wound  was Blister. The wound is located on the Left,Medial Lower Leg. The wound measures 1.6cm length x 4.3cm width x 0.1cm depth; 5.404cm^2 area and 0.54cm^3 volume. There is Fat Layer (Subcutaneous Tissue) Exposed exposed. There is no tunneling or undermining noted. There is a large amount of serous drainage noted. The wound margin is flat and intact. There is medium (34-66%) red granulation within the wound bed. There is a medium (34-66%) amount of  necrotic tissue within the wound bed including Adherent Slough. The periwound skin appearance exhibited: Hemosiderin Staining. The periwound skin appearance did not exhibit: Callus, Crepitus, Excoriation, Induration, Rash, Scarring, Dry/Scaly, Maceration, Atrophie Blanche, Cyanosis, Ecchymosis, Mottled, Pallor, Rubor, Erythema. Periwound temperature was noted as No Abnormality. The periwound has tenderness on palpation. Wound #12 status is Open. Original cause of wound was Blister. The wound is located on the Left,Posterior Lower Leg. The wound measures 2.4cm length x 1.9cm width x 0.1cm depth; 3.581cm^2 area and 0.358cm^3 volume. There is Fat Layer (Subcutaneous Tissue) Exposed exposed. There is no tunneling or undermining noted. There is a large amount of serous drainage noted. The wound margin is flat and intact. There is large (67-100%) red granulation within the wound bed. There is a small (1-33%) amount of necrotic tissue within the wound bed including Adherent Slough. The periwound skin appearance exhibited: Hemosiderin Staining. The periwound skin appearance did not exhibit: Callus, Crepitus, Excoriation, Induration, Rash, Scarring, Dry/Scaly, Maceration, Atrophie Blanche, Cyanosis, Ecchymosis, Mottled, Pallor, Rubor, Erythema. Periwound temperature was noted as No Abnormality. Wound #9 status is Open. Original cause of wound was Blister. The wound is located on the Right,Anterior Lower Leg. The wound measures 1cm length x 0.4cm width x 0.1cm depth; 0.314cm^2 area and 0.031cm^3 volume. There is Fat Layer (Subcutaneous Tissue) Exposed exposed. There is no tunneling or undermining noted. There is a large amount of serous drainage noted. The wound margin is flat and intact. There is medium (34-66%) red granulation within the wound bed. There is a medium (34-66%) amount of necrotic tissue within the wound bed including Adherent Slough. The periwound skin appearance exhibited: Hemosiderin  Staining. The periwound skin appearance did not exhibit: Callus, Crepitus, Excoriation, Induration, Rash, Scarring, Dry/Scaly, Maceration, Atrophie Blanche, Cyanosis, Ecchymosis, Mottled, Pallor, Rubor, Erythema. Periwound temperature was noted as No Abnormality. The periwound has tenderness on palpation. 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 ([id number]) Plan Wound Cleansing: Wound #10 Left,Anterior Lower Leg: Clean wound with Normal Saline. Cleanse wound with mild soap and water Wound #11 Left,Medial Lower Leg: Clean wound with Normal Saline. Cleanse wound with mild soap and water Wound #12 Left,Posterior Lower Leg: Clean wound with Normal Saline. Cleanse wound with mild soap and water Wound #9 Right,Anterior Lower Leg: Clean wound with Normal Saline. Cleanse wound with mild soap and water Anesthetic (add to Medication List): Wound #10 Left,Anterior Lower Leg: Topical Lidocaine 4% cream applied to wound bed prior to debridement (In Clinic Only). Wound #11 Left,Medial Lower Leg: Topical Lidocaine 4% cream applied to wound bed prior to debridement (In Clinic Only). Wound #12 Left,Posterior Lower Leg: Topical Lidocaine 4% cream applied to wound bed prior to debridement (In Clinic Only). Wound #9 Right,Anterior Lower Leg: Topical Lidocaine 4% cream applied to wound bed prior to debridement (In Clinic Only). Skin Barriers/Peri-Wound Care: Moisturizing lotion - not on wounds Primary  Wound Dressing: Wound #10 Left,Anterior Lower Leg: Hydrafera Blue Ready Transfer - upon removing from wound moisten with warm soapy water Wound #11 Left,Medial Lower Leg: Hydrafera Blue Ready Transfer - upon removing from wound moisten with warm soapy water Wound #12  Left,Posterior Lower Leg: Hydrafera Blue Ready Transfer - upon removing from wound moisten with warm soapy water Wound #9 Right,Anterior Lower Leg: Hydrafera Blue Ready Transfer - upon removing from wound moisten with warm soapy water Secondary Dressing: Wound #10 Left,Anterior Lower Leg: ABD pad Wound #11 Left,Medial Lower Leg: ABD pad Wound #12 Left,Posterior Lower Leg: ABD pad Wound #9 Right,Anterior Lower Leg: ABD pad Dressing Change Frequency: Wound #10 Left,Anterior Lower Leg: Dressing is to be changed Monday and Thursday. - start next week Other: - Please go out to see pt tomorrow (Friday) 10/19/17 to change wraps Wound #11 Left,Medial Lower Leg: Dressing is to be changed Monday and Thursday. - start next week Other: - Please go out to see pt tomorrow (Friday) 10/19/17 to change wraps Wound #12 Left,Posterior Lower Leg: Dressing is to be changed Monday and Thursday. - start next week Other: - Please go out to see pt tomorrow (Friday) 10/19/17 to change wraps Wound #9 Right,Anterior Lower Leg: Dressing is to be changed Monday and Thursday. - start next week Adam Merritt, Adam Merritt ([id number]) Other: - Please go out to see pt tomorrow (Friday) 10/19/17 to change wraps Follow-up Appointments: Wound #10 Left,Anterior Lower Leg: Return Appointment in 1 week. Wound #11 Left,Medial Lower Leg: Return Appointment in 1 week. Wound #12 Left,Posterior Lower Leg: Return Appointment in 1 week. Wound #9 Right,Anterior Lower Leg: Return Appointment in 1 week. Edema Control: Wound #10 Left,Anterior Lower Leg: 3 Layer Compression System - Bilateral - unna to anchor Wound #11 Left,Medial Lower Leg: 3 Layer Compression System - Bilateral - unna to anchor Wound #12 Left,Posterior Lower Leg: 3 Layer Compression System - Bilateral - unna to anchor Wound #9 Right,Anterior Lower Leg: 3 Layer Compression System - Bilateral - unna to anchor Additional Orders / Instructions: Wound #10 Left,Anterior  Lower Leg: Increase protein intake. Wound #11 Left,Medial Lower Leg: Increase protein intake. Wound #12 Left,Posterior Lower Leg: Increase protein intake. Wound #9 Right,Anterior Lower Leg: Increase protein intake. Home Health: Wound #10 Left,Anterior Lower Leg: [place] Visits - Please go out to see pt tomorrow (Friday) 10/19/17 to change wraps Wound #11 Left,Medial Lower Leg: [place] Visits - Please go out to see pt tomorrow (Friday) 10/19/17 to change wraps Wound #12 Left,Posterior Lower Leg: [place] Visits - Please go out to see pt tomorrow (Friday) 10/19/17 to change wraps Wound #9 Right,Anterior Lower Leg: [place] Visits - Please go out to see pt tomorrow (Friday) 10/19/17 to change wraps Services and Therapies ordered were: Arterial Studies- Bilateral - [facility] The following medication(s) was prescribed: lidocaine topical 4 % cream 1 1 cream topical was prescribed at facility Electronic Signature(s) Signed: 10/25/2017 8:04:57 AM By: Lawanda Cousins Previous Signature: 10/19/2017 2:21:08 PM Version By: Lawanda Cousins Previous Signature: 10/18/2017 1:24:58 PM Version By: Lawanda Cousins Entered By: Lawanda Cousins on 10/25/2017 08:04:57 Adam Merritt ([id number]) -------------------------------------------------------------------------------- ROS/PFSH Details Patient Name: Adam Merritt Date of Service: 10/18/2017 9:45 AM Medical Record Number: [id number] Patient Account Number: [id number] Date of Birth/Sex: [birth year] (72 y.o. M) Treating RN: Montey Hora Primary Care Provider: [name] Other Clinician: Referring Provider: [name] Treating Provider/Extender: Cathie Olden in Treatment: 0 Information Obtained From Patient Wound History Do you currently have one  or more open woundso Yes How many open wounds do you currently haveo 3 Approximately how long have you had your woundso 4 weeks How have you been treating  your wound(s) until nowo kerlix and ace wraps Has your wound(s) ever healed and then re-openedo No Have you had any lab work done in the past montho No Have you tested positive for an antibiotic resistant organism (MRSA, VRE)o No Have you tested positive for osteomyelitis (bone infection)o No Have you had any tests for circulation on your legso Yes Who ordered the testo [facility] Where was the test doneo [facility] Have you had other problems associated with your woundso Swelling Eyes Complaints and Symptoms: Negative for: Dry Eyes; Vision Changes; Glasses / Contacts Medical History: Negative for: Cataracts; Glaucoma; Optic Neuritis Ear/Nose/Mouth/Throat Complaints and Symptoms: Negative for: Difficult clearing ears; Sinusitis Medical History: Negative for: Chronic sinus problems/congestion; Middle ear problems Hematologic/Lymphatic Complaints and Symptoms: Negative for: Bleeding / Clotting Disorders; Human Immunodeficiency Virus Medical History: Positive for: Anemia - aplastic anemia; Lymphedema Negative for: Hemophilia; Human Immunodeficiency Virus; Sickle Cell Disease Respiratory Complaints and Symptoms: Negative for: Chronic or frequent coughs; Shortness of Breath Medical History: Positive for: Chronic Obstructive Pulmonary Disease (COPD); Sleep Apnea Negative for: Aspiration; Asthma; Pneumothorax; Tuberculosis Adam Merritt, Adam Merritt ([id number]) Past Medical History Notes: home O2 at night as needed Cardiovascular Complaints and Symptoms: Negative for: Chest pain; LE edema Medical History: Positive for: Congestive Heart Failure; Coronary Artery Disease; Hypertension; Peripheral Venous Disease Negative for: Angina; Arrhythmia; Deep Vein Thrombosis; Hypotension; Myocardial Infarction; Peripheral Arterial Disease; Phlebitis; Vasculitis Past Medical History Notes: varicose veins Gastrointestinal Complaints and Symptoms: Negative for: Frequent diarrhea; Nausea; Vomiting Medical  History: Negative for: Cirrhosis ; Colitis; Crohnos; Hepatitis A; Hepatitis B; Hepatitis C Endocrine Complaints and Symptoms: Negative for: Hepatitis; Thyroid disease; Polydypsia (Excessive Thirst) Medical History: Positive for: Type II Diabetes Negative for: Type I Diabetes Time with diabetes: since 2006 Treated with: Insulin Blood sugar tested every day: Yes Tested : Blood sugar testing results: Breakfast: 209 Immunological Complaints and Symptoms: Negative for: Hives; Itching Medical History: Negative for: Lupus Erythematosus; Raynaudos; Scleroderma Integumentary (Skin) Complaints and Symptoms: Positive for: Wounds; Swelling Negative for: Bleeding or bruising tendency; Breakdown Medical History: Negative for: History of Burn; History of pressure wounds Musculoskeletal Complaints and Symptoms: Positive for: Muscle Weakness Negative for: Muscle Pain Adam Merritt, Adam Merritt ([id number]) Medical History: Positive for: Osteoarthritis Negative for: Gout; Rheumatoid Arthritis; Osteomyelitis Neurologic Complaints and Symptoms: Negative for: Numbness/parasthesias; Focal/Weakness Medical History: Positive for: Neuropathy Negative for: Dementia; Quadriplegia; Paraplegia; Seizure Disorder Past Medical History Notes: CVA - 1992 Psychiatric Complaints and Symptoms: Negative for: Anxiety; Claustrophobia Medical History: Negative for: Anorexia/bulimia; Confinement Anxiety Genitourinary Medical History: Negative for: End Stage Renal Disease Oncologic Medical History: Positive for: Received Chemotherapy - last dose 2006 Negative for: Received Radiation Immunizations Pneumococcal Vaccine: Received Pneumococcal Vaccination: Yes Implantable Devices Hospitalization / Surgery History Name of Hospital Purpose of Hospitalization/Surgery Date [facility] bronchitis 11/03/2013 Family and Social History Cancer: Yes - Paternal Grandparents; Diabetes: No; Heart Disease: No; Hereditary Spherocytosis:  No; Hypertension: No; Kidney Disease: Yes - Siblings; Lung Disease: Yes - Mother; Seizures: No; Stroke: No; Thyroid Problems: No; Tuberculosis: No; Never smoker; Marital Status - Married; Alcohol Use: Never; Drug Use: No History; Caffeine Use: Never; Financial Concerns: No; Food, Clothing or Shelter Needs: No; Support System Lacking: No; Transportation Concerns: No; Advanced Directives: No; Patient does not want information on Visual merchandiser Signature(s) Signed: 10/18/2017 4:44:42 PM By: Montey Hora Signed: 10/18/2017 5:24:28 PM By:  Myisha Pickerel Entered By: Montey Hora on 10/18/2017 10:18:28 Adam Merritt ([id number], Adam Merritt ([id number]) -------------------------------------------------------------------------------- [place] Details Patient Name: Adam Merritt Date of Service: 10/18/2017 Medical Record Number: [id number] Patient Account Number: [id number] Date of Birth/Sex: 1946-04-10 (72 y.o. 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 I89.0 Lymphedema, not elsewhere classified E66.9 Obesity, unspecified Facility Procedures CPT4 Code: [id number] Description: [phone] - WOUND CARE VISIT-LEV 5 EST PT Modifier: Quantity: 1 Physician Procedures CPT4 Code: [id number] Description: 14431 - WC PHYS LEVEL 3 - EST PT ICD-10 Diagnosis Description L97.211 Non-pressure chronic ulcer of right calf limited to breakdo L97.222 Non-pressure chronic ulcer of left calf with fat layer expo I89.0 Lymphedema, not elsewhere classified Modifier: wn of skin sed Quantity: 1 Electronic Signature(s) Signed: 10/18/2017 2:43:44 PM By: Alric Quan Signed: 10/18/2017 5:24:28 PM By: Lawanda Cousins Previous Signature: 10/18/2017 1:35:23 PM Version By: Lawanda Cousins Entered By: Alric Quan on 10/18/2017 14:43:44

## 2017-10-21 NOTE — Progress Notes (Signed)
KOVEN, BELINSKY ([id number]) Visit Report for 10/18/2017 Allergy List Details Patient Name: Adam Merritt, Adam Merritt Date of Service: 10/18/2017 9:45 AM Medical Record Number: [id number] Patient Account Number: [id number] Date of Birth/Sex: Oct 24, 1945 (72 y.o. M) Treating RN: Montey Hora Primary Care Shauna Bodkins: [name] Other Clinician: Referring Vania Rosero: Referral, Self Treating Bruce Churilla/Extender: Cathie Olden in Treatment: 0 Allergies Active Allergies Corticosteroids (Glucocorticoids) Allergy Notes Electronic Signature(s) Signed: 10/18/2017 4:44:42 PM By: Montey Hora Entered By: Montey Hora on 10/18/2017 10:14:25 Adam Merritt ([id number]) -------------------------------------------------------------------------------- Arrival Information Details Patient Name: Adam Merritt Date of Service: 10/18/2017 9:45 AM Medical Record Number: [id number] Patient Account Number: [id number] Date of Birth/Sex: 03-01-1946 (72 y.o. M) Treating RN: Montey Hora Primary Care Eddy Termine: [name] Other Clinician: Referring Airi Copado: Referral, Self Treating Aqua Denslow/Extender: Cathie Olden in Treatment: 0 Visit Information Patient Arrived: Cane Arrival Time: 10:05 Accompanied By: wife Transfer Assistance: None Patient Identification Verified: Yes Secondary Verification Process Completed: Yes Patient Has Alerts: Yes Patient Alerts: DMII History Since Last Visit Added or deleted any medications: No Any new allergies or adverse reactions: No Had a fall or experienced change in activities of daily living that may affect risk of falls: No Signs or symptoms of abuse/neglect since last visito No Hospitalized since last visit: No Implantable device outside of the clinic excluding cellular tissue based products placed in the center since last visit: No Has Compression in Place as Prescribed: Yes Electronic Signature(s) Signed: 10/18/2017 4:44:42 PM By: Montey Hora Entered By:  Montey Hora on 10/18/2017 10:05:58 Adam Merritt ([id number]) -------------------------------------------------------------------------------- Clinic Level of Care Assessment Details Patient Name: Adam Merritt Date of Service: 10/18/2017 9:45 AM Medical Record Number: [id number] Patient Account Number: [id number] Date of Birth/Sex: [birth year] (72 y.o. M) Treating RN: Ahmed Prima Primary Care Kylina Vultaggio: [name] Other Clinician: Referring Kenora Spayd: Referral, Self Treating Nneka Blanda/Extender: Cathie Olden in Treatment: 0 Clinic Level of Care Assessment Items TOOL 2 Quantity Score X - Use when only an EandM is performed on the INITIAL visit 1 0 ASSESSMENTS - Nursing Assessment / Reassessment X - General Physical Exam (combine w/ comprehensive assessment (listed just below) when 1 20 performed on new pt. evals) X- 1 25 Comprehensive Assessment (HX, ROS, Risk Assessments, Wounds Hx, etc.) ASSESSMENTS - Wound and Skin Assessment / Reassessment []  - Simple Wound Assessment / Reassessment - one wound 0 X- 4 5 Complex Wound Assessment / Reassessment - multiple wounds []  - 0 Dermatologic / Skin Assessment (not related to wound area) ASSESSMENTS - Ostomy and/or Continence Assessment and Care []  - Incontinence Assessment and Management 0 []  - 0 Ostomy Care Assessment and Management (repouching, etc.) PROCESS - Coordination of Care []  - Simple Patient / Family Education for ongoing care 0 X- 1 20 Complex (extensive) Patient / Family Education for ongoing care X- 1 10 Staff obtains Programmer, systems, Records, Test Results / Process Orders X- 1 10 Staff telephones HHA, Nursing Homes / Clarify orders / etc []  - 0 Routine Transfer to another Facility (non-emergent condition) []  - 0 Routine Hospital Admission (non-emergent condition) X- 1 15 New Admissions / Biomedical engineer / Ordering NPWT, Apligraf, etc. []  - 0 Emergency Hospital Admission (emergent condition) X- 1  10 Simple Discharge Coordination []  - 0 Complex (extensive) Discharge Coordination PROCESS - Special Needs []  - Pediatric / Minor Patient Management 0 []  - 0 Isolation Patient Management Adam Merritt, GERHOLD. ([id number]) []  - 0 Hearing / Language / Visual special needs []  - 0 Assessment of Community assistance (transportation, D/C planning,  etc.) []  - 0 Additional assistance / Altered mentation []  - 0 Support Surface(s) Assessment (bed, cushion, seat, etc.) INTERVENTIONS - Wound Cleansing / Measurement X - Wound Imaging (photographs - any number of wounds) 1 5 []  - 0 Wound Tracing (instead of photographs) []  - 0 Simple Wound Measurement - one wound X- 4 5 Complex Wound Measurement - multiple wounds []  - 0 Simple Wound Cleansing - one wound X- 4 5 Complex Wound Cleansing - multiple wounds INTERVENTIONS - Wound Dressings []  - Small Wound Dressing one or multiple wounds 0 []  - 0 Medium Wound Dressing one or multiple wounds X- 2 20 Large Wound Dressing one or multiple wounds []  - 0 Application of Medications - injection INTERVENTIONS - Miscellaneous []  - External ear exam 0 []  - 0 Specimen Collection (cultures, biopsies, blood, body fluids, etc.) []  - 0 Specimen(s) / Culture(s) sent or taken to Lab for analysis []  - 0 Patient Transfer (multiple staff / Civil Service fast streamer / Similar devices) []  - 0 Simple Staple / Suture removal (25 or less) []  - 0 Complex Staple / Suture removal (26 or more) []  - 0 Hypo / Hyperglycemic Management (close monitor of Blood Glucose) []  - 0 Ankle / Brachial Index (ABI) - do not check if billed separately Has the patient been seen at the hospital within the last three years: Yes Total Score: 215 Level Of Care: New/Established - Level 5 Electronic Signature(s) Signed: 10/19/2017 4:38:26 PM By: Alric Quan Entered By: Alric Quan on 10/18/2017 14:43:29 Adam Merritt  ([id number]) -------------------------------------------------------------------------------- Encounter Discharge Information Details Patient Name: Adam Merritt Date of Service: 10/18/2017 9:45 AM Medical Record Number: [id number] Patient Account Number: [id number] Date of Birth/Sex: 1945/11/25 (72 y.o. M) Treating RN: Roger Shelter Primary Care Zayvion Stailey: [name] Other Clinician: Referring Toshiye Kever: Referral, Self Treating Jael Kostick/Extender: Cathie Olden in Treatment: 0 Encounter Discharge Information Items Discharge Condition: Stable Ambulatory Status: Cane Discharge Destination: Home Transportation: Private Auto Accompanied By: wife Schedule Follow-up Appointment: Yes Clinical Summary of Care: Electronic Signature(s) Signed: 10/18/2017 4:26:21 PM By: Roger Shelter Entered By: Roger Shelter on 10/18/2017 11:26:56 Adam Merritt ([id number]) -------------------------------------------------------------------------------- Lower Extremity Assessment Details Patient Name: Adam Merritt Date of Service: 10/18/2017 9:45 AM Medical Record Number: [id number] Patient Account Number: [id number] Date of Birth/Sex: 05-Mar-1946 (72 y.o. M) Treating RN: Montey Hora Primary Care Allyssa Abruzzese: [name] Other Clinician: Referring Jonah Gingras: Referral, Self Treating Raiden Haydu/Extender: Cathie Olden in Treatment: 0 Edema Assessment Assessed: [Left: No] [Right: No] Edema: [Left: Yes] [Right: Yes] Calf Left: Right: Point of Measurement: 32 cm From Medial Instep 62 cm 56 cm Ankle Left: Right: Point of Measurement: 11 cm From Medial Instep 36.6 cm 32.4 cm Vascular Assessment Pulses: Dorsalis Pedis Palpable: [Left:Yes] [Right:Yes] Doppler Audible: [Left:Yes] [Right:Yes] Posterior Tibial Palpable: [Left:No] [Right:No] Doppler Audible: [Left:Yes] [Right:Yes] Extremity colors, hair growth, and conditions: Extremity Color: [Left:Hyperpigmented]  [Right:Hyperpigmented] Hair Growth on Extremity: [Left:No] [Right:No] Temperature of Extremity: [Left:Warm] [Right:Warm] Capillary Refill: [Left:< 3 seconds] [Right:< 3 seconds] Toe Nail Assessment Left: Right: Thick: Yes Yes Discolored: Yes Yes Deformed: No No Improper Length and Hygiene: No No Notes unable to get ABI r/t leg size and bp cuff size Electronic Signature(s) Signed: 10/18/2017 4:44:42 PM By: Montey Hora Entered By: Montey Hora on 10/18/2017 10:38:31 Adam Merritt ([id number]) -------------------------------------------------------------------------------- Multi Wound Chart Details Patient Name: Adam Merritt Date of Service: 10/18/2017 9:45 AM Medical Record Number: [id number] Patient Account Number: [id number] Date of Birth/Sex: [birth year] (72 y.o. 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] [11:Diabetic Wound/Ulcer of the Lower Extremity] [12:Diabetic Wound/Ulcer of the Lower Extremity] Secondary Etiology: [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] [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] [12:Venous Leg Ulcer] Comorbid History: [10:Anemia, Lymphedema, Chronic Obstructive Pulmonary Disease (COPD), Sleep Apnea, Congestive Heart Failure, Coronary Artery Disease, Hypertension, Peripheral Venous Disease, Type II Diabetes, Osteoarthritis, Neuropathy, Received  Chemotherapy] [11:Anemia, Lymphedema, Chronic Obstructive  Pulmonary Disease (COPD), Sleep Apnea, Congestive Heart Failure, Coronary Artery Disease, Hypertension, Peripheral Venous Disease, Type II Diabetes, Osteoarthritis, Neuropathy, Received  Chemotherapy] [12:Anemia, Lymphedema, Chronic Obstructive Pulmonary Disease (COPD), Sleep Apnea, Congestive Heart Failure, Coronary Artery Disease, Hypertension, Peripheral Venous Disease, Type II Diabetes, Osteoarthritis, Neuropathy, Received  Chemotherapy] Date Acquired: [10:10/01/2017] [11:10/01/2017] [12:10/01/2017] Weeks of Treatment: [10:0] [11:0] [12:0] Wound Status: [10:Open] [11:Open] [12:Open] Measurements L x W x D [10:0.9x4x0.1] [11:1.6x4.3x0.1] [12:2.4x1.9x0.1] (cm) Area (cm) : [10:2.827] [11:5.404] [12:3.581] Volume (cm) : [10:0.283] [11:0.54] [12:0.358] Classification: [10:Grade 1] [11:Grade 1] [12:Grade 1] Exudate Amount: [10:Medium] [11:Large] [12:Large] Exudate Type: [10:Serous] [11:Serous] [12:Serous] Exudate Color: [10:amber] [11:amber] [12:amber] Wound Margin: [10:Flat and Intact] [11:Flat and Intact] [12:Flat and Intact] Granulation Amount: [10:Medium (34-66%)] [11:Medium (34-66%)] [12:Large (67-100%)] Granulation Quality: [10:Red] [11:Red] [12:Red] Necrotic Amount: [10:Medium (34-66%)] [11:Medium (34-66%)] [12:Small (1-33%)] Exposed Structures: [10:Fat Layer (Subcutaneous Tissue) Exposed: Yes Fascia: No Tendon: No Muscle: No] [11:Fat Layer (Subcutaneous Tissue) Exposed: Yes Fascia: No Tendon: No Muscle: No] [12:Fat Layer (Subcutaneous Tissue) Exposed: Yes Fascia: No Tendon: No Muscle: No] Joint: No Joint: No Joint: No Bone: No Bone: No Bone: No Epithelialization: Small (1-33%) Small (1-33%) None Periwound Skin Texture: Excoriation: No Excoriation: No Excoriation: No Induration: No Induration: No Induration: No Callus: No Callus: No Callus: No Crepitus: No Crepitus: No Crepitus: No Rash: No Rash: No Rash: No Scarring: No Scarring: No Scarring: No Periwound Skin  Moisture: Maceration: No Maceration: No Maceration: No Dry/Scaly: No Dry/Scaly: No Dry/Scaly: No Periwound Skin Color: Hemosiderin Staining: Yes Hemosiderin Staining: Yes Hemosiderin Staining: Yes Atrophie Blanche: No Atrophie Blanche: No Atrophie Blanche: No Cyanosis: No Cyanosis: No Cyanosis: No Ecchymosis: No Ecchymosis: No Ecchymosis: No Erythema: No Erythema: No Erythema: No Mottled: No Mottled: No Mottled: No Pallor: No Pallor: No Pallor: No Rubor: No Rubor: No Rubor: No Temperature: No Abnormality No Abnormality No Abnormality Tenderness on Palpation: Yes Yes No Wound Preparation: Ulcer Cleansing: Ulcer Cleansing: Ulcer Cleansing: Rinsed/Irrigated with Saline Rinsed/Irrigated with Saline Rinsed/Irrigated with Saline Topical Anesthetic Applied: Topical Anesthetic Applied: Topical Anesthetic Applied: Other: lidocaine 4% Other: lidocaine 4% Other: lidocaine 4% Wound Number: 9 N/A N/A Photos: No Photos N/A N/A Wound Location: Right Lower Leg - Anterior N/A N/A Wounding Event: Blister N/A N/A Primary Etiology: Diabetic Wound/Ulcer of the N/A N/A Lower Extremity Secondary Etiology: Venous Leg Ulcer N/A N/A Comorbid History: Anemia, Lymphedema, N/A N/A Chronic Obstructive Pulmonary Disease (COPD), Sleep Apnea, Congestive Heart Failure, Coronary Artery Disease, Hypertension, Peripheral Venous Disease, Type II Diabetes, Osteoarthritis, Neuropathy, Received Chemotherapy Date Acquired: 10/01/2017 N/A N/A Weeks of Treatment: 0 N/A N/A Wound Status: Open N/A N/A Measurements L x W x D 1x0.4x0.1 N/A N/A (cm) Area (cm) : 0.314 N/A N/A Volume (cm) : 0.031 N/A N/A Classification:  Grade 1 N/A N/A Exudate Amount: Large N/A N/A Exudate Type: Serous N/A N/A Exudate Color: amber N/A N/A Wound Margin: Flat and Intact N/A N/A Granulation Amount: Medium (34-66%) N/A N/A Granulation Quality: Red N/A N/A Adam Merritt, Adam Merritt ([id number]) Necrotic Amount: Medium  (34-66%) N/A N/A Exposed Structures: Fat Layer (Subcutaneous N/A N/A Tissue) Exposed: Yes Fascia: No Tendon: No Muscle: No Joint: No Bone: No Epithelialization: None N/A N/A Periwound Skin Texture: Excoriation: No N/A N/A Induration: No Callus: No Crepitus: No Rash: No Scarring: No Periwound Skin Moisture: Maceration: No N/A N/A Dry/Scaly: No Periwound Skin Color: Hemosiderin Staining: Yes N/A N/A Atrophie Blanche: No Cyanosis: No Ecchymosis: No Erythema: No Mottled: No Pallor: No Rubor: No Temperature: No Abnormality N/A N/A Tenderness on Palpation: Yes N/A N/A Wound Preparation: Ulcer Cleansing: N/A N/A Rinsed/Irrigated with Saline Topical Anesthetic Applied: Other: lidocaine 4% Treatment Notes Wound #10 (Left, Anterior Lower Leg) 1. Cleansed with: Clean wound with Normal Saline 2. Anesthetic Topical Lidocaine 4% cream to wound bed prior to debridement 3. Peri-wound Care: Moisturizing lotion 4. Dressing Applied: Hydrafera Blue 5. Secondary Dressing Applied ABD Pad 7. Secured with 3 Layer Compression System - Bilateral Wound #11 (Left, Medial Lower Leg) 1. Cleansed with: Clean wound with Normal Saline 2. Anesthetic Topical Lidocaine 4% cream to wound bed prior to debridement 3. Peri-wound Care: Moisturizing lotion Adam Merritt, LABAN. ([id number]) 4. Dressing Applied: Hydrafera Blue 5. Secondary Dressing Applied ABD Pad 7. Secured with 3 Layer Compression System - Bilateral Wound #12 (Left, Posterior Lower Leg) 1. Cleansed with: Clean wound with Normal Saline 2. Anesthetic Topical Lidocaine 4% cream to wound bed prior to debridement 3. Peri-wound Care: Moisturizing lotion 4. Dressing Applied: Hydrafera Blue 5. Secondary Dressing Applied ABD Pad 7. Secured with 3 Layer Compression System - Bilateral Wound #9 (Right, Anterior Lower Leg) 1. Cleansed with: Clean wound with Normal Saline 2. Anesthetic Topical Lidocaine 4% cream to wound bed prior  to debridement 3. Peri-wound Care: Moisturizing lotion 4. Dressing Applied: Hydrafera Blue 5. Secondary Dressing Applied ABD Pad 7. Secured with 3 Layer Compression System - Bilateral Electronic Signature(s) Signed: 10/18/2017 1:06:12 PM By: Lawanda Cousins Entered By: Lawanda Cousins on 10/18/2017 Webster, Javiel W. ([id number]) -------------------------------------------------------------------------------- [place] Details Patient Name: Adam Merritt Date of Service: 10/18/2017 9:45 AM Medical Record Number: [id number] Patient Account Number: [id number] Date of Birth/Sex: 1946-04-05 (72 y.o. M) Treating RN: Ahmed Prima Primary Care Malaia Buchta: [name] Other Clinician: Referring Taneia Mealor: Referral, Self Treating Jamoni Broadfoot/Extender: Cathie Olden in Treatment: 0 Active Inactive ` Abuse / Safety / Falls / Self Care Management Nursing Diagnoses: Potential for falls Goals: Patient will not experience any injury related to falls Date Initiated: 10/18/2017 Target Resolution Date: 02/09/2018 Goal Status: Active Interventions: Assess Activities of Daily Living upon admission and as needed Assess fall risk on admission and as needed Assess: immobility, friction, shearing, incontinence upon admission and as needed Assess impairment of mobility on admission and as needed per policy Assess personal safety and home safety (as indicated) on admission and as needed Assess self care needs on admission and as needed Notes: ` Orientation to the Wound Care Program Nursing Diagnoses: Knowledge deficit related to the wound healing center program Goals: Patient/caregiver will verbalize understanding of the [facility] Program Date Initiated: 10/18/2017 Target Resolution Date: 11/10/2017 Goal Status: Active Interventions: Provide education on orientation to the wound center Notes: ` Wound/Skin Impairment Nursing Diagnoses: Impaired tissue  integrity Knowledge deficit related to ulceration/compromised skin integrity Adam Merritt. (  [id number]) Goals: Ulcer/skin breakdown will have a volume reduction of 80% by week 12 Date Initiated: 10/18/2017 Target Resolution Date: 01/05/2018 Goal Status: Active Interventions: Assess patient/caregiver ability to perform ulcer/skin care regimen upon admission and as needed Assess ulceration(s) every visit Notes: Electronic Signature(s) Signed: 10/19/2017 4:38:26 PM By: Alric Quan Entered By: Alric Quan on 10/18/2017 10:54:22 Adam Merritt ([id number]) -------------------------------------------------------------------------------- Pain Assessment Details Patient Name: Adam Merritt Date of Service: 10/18/2017 9:45 AM Medical Record Number: [id number] Patient Account Number: [id number] Date of Birth/Sex: 12-Jan-1946 (72 y.o. M) Treating RN: Montey Hora Primary Care Kurtis Anastasia: [name] Other Clinician: Referring Cyndia Degraff: Referral, Self Treating Quinnie Barcelo/Extender: Cathie Olden in Treatment: 0 Active Problems Location of Pain Severity and Description of Pain Patient Has Paino Yes Site Locations Pain Location: Pain in Ulcers With Dressing Change: Yes Duration of the Pain. Constant / Intermittento Constant Character of Pain Describe the Pain: Aching Pain Management and Medication Current Pain Management: Electronic Signature(s) Signed: 10/18/2017 4:44:42 PM By: Montey Hora Entered By: Montey Hora on 10/18/2017 10:06:22 Adam Merritt ([id number]) -------------------------------------------------------------------------------- Patient/Caregiver Education Details Patient Name: Adam Merritt Date of Service: 10/18/2017 9:45 AM Medical Record Number: [id number] Patient Account Number: [id number] Date of Birth/Gender: 06/27/1945 (72 y.o. M) Treating RN: Roger Shelter Primary Care Physician: [name] Other Clinician: Referring Physician: Referral,  Self Treating Physician/Extender: Cathie Olden in Treatment: 0 Education Assessment Education Provided To: Patient Education Topics Provided Wound/Skin Impairment: Methods: Explain/Verbal Responses: State content correctly Electronic Signature(s) Signed: 10/18/2017 4:26:21 PM By: Roger Shelter Entered By: Roger Shelter on 10/18/2017 11:27:33 Adam Merritt ([id number]) -------------------------------------------------------------------------------- Wound Assessment Details Patient Name: Adam Merritt Date of Service: 10/18/2017 9:45 AM Medical Record Number: [id number] Patient Account Number: [id number] Date of Birth/Sex: [birth year] (72 y.o. M) Treating RN: Montey Hora Primary Care Kaleb Sek: [name] Other Clinician: Referring Airyonna Franklyn: Referral, Self Treating Bellah Alia/Extender: Cathie Olden in Treatment: 0 Wound Status Wound Number: 10 Primary Diabetic Wound/Ulcer of the Lower Extremity Etiology: Wound Location: Left Lower Leg - Anterior Secondary Venous Leg Ulcer Wounding Event: Blister Etiology: Date Acquired: 10/01/2017 Wound Open Weeks Of Treatment: 0 Status: Clustered Wound: No Comorbid Anemia, Lymphedema, Chronic Obstructive History: Pulmonary Disease (COPD), Sleep Apnea, Congestive Heart Failure, Coronary Artery Disease, Hypertension, Peripheral Venous Disease, Type II Diabetes, Osteoarthritis, Neuropathy, Received Chemotherapy Photos Photo Uploaded By: Montey Hora on 10/18/2017 16:16:15 Wound Measurements Length: (cm) 0.9 Width: (cm) 4 Depth: (cm) 0.1 Area: (cm) 2.827 Volume: (cm) 0.283 % Reduction in Area: % Reduction in Volume: Epithelialization: Small (1-33%) Tunneling: No Undermining: No Wound Description Classification: Grade 1 Wound Margin: Flat and Intact Exudate Amount: Medium Exudate Type: Serous Exudate Color: amber Foul Odor After Cleansing: No Slough/Fibrino Yes Wound Bed Granulation Amount: Medium  (34-66%) Exposed Structure Granulation Quality: Red Fascia Exposed: No Necrotic Amount: Medium (34-66%) Fat Layer (Subcutaneous Tissue) Exposed: Yes Necrotic Quality: Adherent Slough Tendon Exposed: No Adam Merritt, Adam Merritt ([id number]) Muscle Exposed: No Joint Exposed: No Bone Exposed: No Periwound Skin Texture Texture Color No Abnormalities Noted: No No Abnormalities Noted: No Callus: No Atrophie Blanche: No Crepitus: No Cyanosis: No Excoriation: No Ecchymosis: No Induration: No Erythema: No Rash: No Hemosiderin Staining: Yes Scarring: No Mottled: No Pallor: No Moisture Rubor: No No Abnormalities Noted: No Dry / Scaly: No Temperature / Pain Maceration: No Temperature: No Abnormality Tenderness on Palpation: Yes Wound Preparation Ulcer Cleansing: Rinsed/Irrigated with Saline Topical Anesthetic Applied: Other: lidocaine 4%, Treatment Notes Wound #10 (Left, Anterior Lower Leg) 1. Cleansed with: Clean wound with Normal  Saline 2. Anesthetic Topical Lidocaine 4% cream to wound bed prior to debridement 3. Peri-wound Care: Moisturizing lotion 4. Dressing Applied: Hydrafera Blue 5. Secondary Dressing Applied ABD Pad 7. Secured with 3 Layer Compression System - Bilateral Electronic Signature(s) Signed: 10/18/2017 4:44:42 PM By: Montey Hora Entered By: Montey Hora on 10/18/2017 10:28:40 Adam Merritt ([id number]) -------------------------------------------------------------------------------- Wound Assessment Details Patient Name: Adam Merritt Date of Service: 10/18/2017 9:45 AM Medical Record Number: [id number] Patient Account Number: [id number] Date of Birth/Sex: 1946-02-22 (72 y.o. M) Treating RN: Montey Hora Primary Care Megann Easterwood: [name] Other Clinician: Referring Julita Ozbun: Referral, Self Treating Sumer Moorehouse/Extender: Cathie Olden in Treatment: 0 Wound Status Wound Number: 11 Primary Diabetic Wound/Ulcer of the Lower  Extremity Etiology: Wound Location: Left Lower Leg - Medial Secondary Venous Leg Ulcer Wounding Event: Blister Etiology: Date Acquired: 10/01/2017 Wound Open Weeks Of Treatment: 0 Status: Clustered Wound: No Comorbid Anemia, Lymphedema, Chronic Obstructive History: Pulmonary Disease (COPD), Sleep Apnea, Congestive Heart Failure, Coronary Artery Disease, Hypertension, Peripheral Venous Disease, Type II Diabetes, Osteoarthritis, Neuropathy, Received Chemotherapy Photos Photo Uploaded By: Montey Hora on 10/18/2017 16:16:16 Wound Measurements Length: (cm) 1.6 Width: (cm) 4.3 Depth: (cm) 0.1 Area: (cm) 5.404 Volume: (cm) 0.54 % Reduction in Area: % Reduction in Volume: Epithelialization: Small (1-33%) Tunneling: No Undermining: No Wound Description Classification: Grade 1 Foul O Wound Margin: Flat and Intact Slough Exudate Amount: Large Exudate Type: Serous Exudate Color: amber dor After Cleansing: No /Fibrino Yes Wound Bed Granulation Amount: Medium (34-66%) Exposed Structure Granulation Quality: Red Fascia Exposed: No Necrotic Amount: Medium (34-66%) Fat Layer (Subcutaneous Tissue) Exposed: Yes Necrotic Quality: Adherent Slough Tendon Exposed: No Adam Merritt, Adam Merritt ([id number]) Muscle Exposed: No Joint Exposed: No Bone Exposed: No Periwound Skin Texture Texture Color No Abnormalities Noted: No No Abnormalities Noted: No Callus: No Atrophie Blanche: No Crepitus: No Cyanosis: No Excoriation: No Ecchymosis: No Induration: No Erythema: No Rash: No Hemosiderin Staining: Yes Scarring: No Mottled: No Pallor: No Moisture Rubor: No No Abnormalities Noted: No Dry / Scaly: No Temperature / Pain Maceration: No Temperature: No Abnormality Tenderness on Palpation: Yes Wound Preparation Ulcer Cleansing: Rinsed/Irrigated with Saline Topical Anesthetic Applied: Other: lidocaine 4%, Treatment Notes Wound #11 (Left, Medial Lower Leg) 1. Cleansed  with: Clean wound with Normal Saline 2. Anesthetic Topical Lidocaine 4% cream to wound bed prior to debridement 3. Peri-wound Care: Moisturizing lotion 4. Dressing Applied: Hydrafera Blue 5. Secondary Dressing Applied ABD Pad 7. Secured with 3 Layer Compression System - Bilateral Electronic Signature(s) Signed: 10/18/2017 4:44:42 PM By: Montey Hora Entered By: Montey Hora on 10/18/2017 10:30:26 Adam Merritt ([id number]) -------------------------------------------------------------------------------- Wound Assessment Details Patient Name: Adam Merritt Date of Service: 10/18/2017 9:45 AM Medical Record Number: [id number] Patient Account Number: [id number] Date of Birth/Sex: Apr 21, 1946 (72 y.o. M) Treating RN: Montey Hora Primary Care Dreyton Roessner: [name] Other Clinician: Referring Pharrah Rottman: Referral, Self Treating Carrie Usery/Extender: Cathie Olden in Treatment: 0 Wound Status Wound Number: 12 Primary Diabetic Wound/Ulcer of the Lower Extremity Etiology: Wound Location: Left Lower Leg - Posterior Secondary Venous Leg Ulcer Wounding Event: Blister Etiology: Date Acquired: 10/01/2017 Wound Open Weeks Of Treatment: 0 Status: Clustered Wound: No Comorbid Anemia, Lymphedema, Chronic Obstructive History: Pulmonary Disease (COPD), Sleep Apnea, Congestive Heart Failure, Coronary Artery Disease, Hypertension, Peripheral Venous Disease, Type II Diabetes, Osteoarthritis, Neuropathy, Received Chemotherapy Photos Wound Measurements Length: (cm) 2.4 % Reduction in Width: (cm) 1.9 % Reduction in Depth: (cm) 0.1 Epithelializat Area: (cm) 3.581 Tunneling: Volume: (cm) 0.358 Undermining: Area: 0% Volume: 0% ion: None  No No Wound Description Classification: Grade 1 Foul Odor Afte Wound Margin: Flat and Intact Slough/Fibrino Exudate Amount: Large Exudate Type: Serous Exudate Color: amber r Cleansing: No Yes Wound Bed Granulation Amount: Large (67-100%)  Exposed Structure Granulation Quality: Red Fascia Exposed: No Necrotic Amount: Small (1-33%) Fat Layer (Subcutaneous Tissue) Exposed: Yes Necrotic Quality: Adherent Slough Tendon Exposed: No Muscle Exposed: No Adam Merritt, Adam Merritt ([id number]) Joint Exposed: No Bone Exposed: No Periwound Skin Texture Texture Color No Abnormalities Noted: No No Abnormalities Noted: No Callus: No Atrophie Blanche: No Crepitus: No Cyanosis: No Excoriation: No Ecchymosis: No Induration: No Erythema: No Rash: No Hemosiderin Staining: Yes Scarring: No Mottled: No Pallor: No Moisture Rubor: No No Abnormalities Noted: No Dry / Scaly: No Temperature / Pain Maceration: No Temperature: No Abnormality Wound Preparation Ulcer Cleansing: Rinsed/Irrigated with Saline Topical Anesthetic Applied: Other: lidocaine 4%, Treatment Notes Wound #12 (Left, Posterior Lower Leg) 1. Cleansed with: Clean wound with Normal Saline 2. Anesthetic Topical Lidocaine 4% cream to wound bed prior to debridement 3. Peri-wound Care: Moisturizing lotion 4. Dressing Applied: Hydrafera Blue 5. Secondary Dressing Applied ABD Pad 7. Secured with 3 Layer Compression System - Bilateral Electronic Signature(s) Signed: 10/18/2017 4:44:42 PM By: Montey Hora Entered By: Montey Hora on 10/18/2017 16:17:49 Adam Merritt ([id number]) -------------------------------------------------------------------------------- Wound Assessment Details Patient Name: Adam Merritt Date of Service: 10/18/2017 9:45 AM Medical Record Number: [id number] Patient Account Number: [id number] Date of Birth/Sex: 02/28/1946 (72 y.o. M) Treating RN: Montey Hora Primary Care Kerrigan Gombos: [name] Other Clinician: Referring Evetta Renner: Referral, Self Treating Clarance Bollard/Extender: Cathie Olden in Treatment: 0 Wound Status Wound Number: 9 Primary Diabetic Wound/Ulcer of the Lower Extremity Etiology: Wound Location: Right Lower Leg -  Anterior Secondary Venous Leg Ulcer Wounding Event: Blister Etiology: Date Acquired: 10/01/2017 Wound Open Weeks Of Treatment: 0 Status: Clustered Wound: No Comorbid Anemia, Lymphedema, Chronic Obstructive History: Pulmonary Disease (COPD), Sleep Apnea, Congestive Heart Failure, Coronary Artery Disease, Hypertension, Peripheral Venous Disease, Type II Diabetes, Osteoarthritis, Neuropathy, Received Chemotherapy Photos Wound Measurements Length: (cm) 1 % Reduction in Width: (cm) 0.4 % Reduction in Depth: (cm) 0.1 Epithelializat Area: (cm) 0.314 Tunneling: Volume: (cm) 0.031 Undermining: Area: 0% Volume: 0% ion: None No No Wound Description Classification: Grade 1 Foul Odor Afte Wound Margin: Flat and Intact Slough/Fibrino Exudate Amount: Large Exudate Type: Serous Exudate Color: amber r Cleansing: No Yes Wound Bed Granulation Amount: Medium (34-66%) Exposed Structure Granulation Quality: Red Fascia Exposed: No Necrotic Amount: Medium (34-66%) Fat Layer (Subcutaneous Tissue) Exposed: Yes Necrotic Quality: Adherent Slough Tendon Exposed: No Muscle Exposed: No Adam Merritt, Adam Merritt ([id number]) Joint Exposed: No Bone Exposed: No Periwound Skin Texture Texture Color No Abnormalities Noted: No No Abnormalities Noted: No Callus: No Atrophie Blanche: No Crepitus: No Cyanosis: No Excoriation: No Ecchymosis: No Induration: No Erythema: No Rash: No Hemosiderin Staining: Yes Scarring: No Mottled: No Pallor: No Moisture Rubor: No No Abnormalities Noted: No Dry / Scaly: No Temperature / Pain Maceration: No Temperature: No Abnormality Tenderness on Palpation: Yes Wound Preparation Ulcer Cleansing: Rinsed/Irrigated with Saline Topical Anesthetic Applied: Other: lidocaine 4%, Treatment Notes Wound #9 (Right, Anterior Lower Leg) 1. Cleansed with: Clean wound with Normal Saline 2. Anesthetic Topical Lidocaine 4% cream to wound bed prior to debridement 3.  Peri-wound Care: Moisturizing lotion 4. Dressing Applied: Hydrafera Blue 5. Secondary Dressing Applied ABD Pad 7. Secured with 3 Layer Compression System - Bilateral Electronic Signature(s) Signed: 10/18/2017 4:44:42 PM By: Montey Hora Entered By: Montey Hora on 10/18/2017 16:18:25 Adam Merritt ([id number]) --------------------------------------------------------------------------------  Vitals Details Patient Name: Adam Merritt, Adam Merritt Date of Service: 10/18/2017 9:45 AM Medical Record Number: [id number] Patient Account Number: [id number] Date of Birth/Sex: 06-Nov-1945 (72 y.o. M) Treating RN: Montey Hora Primary Care Santiaga Butzin: [name] Other Clinician: Referring Arleta Ostrum: Referral, Self Treating Ashleynicole Mcclees/Extender: Cathie Olden in Treatment: 0 Vital Signs Time Taken: 10:08 Temperature (F): 97.9 Height (in): 66 Pulse (bpm): 76 Source: Measured Respiratory Rate (breaths/min): 20 Weight (lbs): 298 Blood Pressure (mmHg): 141/65 Source: Measured Reference Range: 80 - 120 mg / dl Body Mass Index (BMI): 48.1 Electronic Signature(s) Signed: 10/18/2017 4:44:42 PM By: Montey Hora Entered By: Montey Hora on 10/18/2017 10:08:30

## 2017-10-22 ENCOUNTER — Ambulatory Visit (INDEPENDENT_AMBULATORY_CARE_PROVIDER_SITE_OTHER): Payer: Medicare Other

## 2017-10-22 ENCOUNTER — Telehealth: Payer: Self-pay | Admitting: Nurse Practitioner

## 2017-10-22 ENCOUNTER — Other Ambulatory Visit (INDEPENDENT_AMBULATORY_CARE_PROVIDER_SITE_OTHER): Payer: Self-pay | Admitting: *Deleted

## 2017-10-22 ENCOUNTER — Other Ambulatory Visit: Payer: Self-pay | Admitting: Nurse Practitioner

## 2017-10-22 DIAGNOSIS — L97211 Non-pressure chronic ulcer of right calf limited to breakdown of skin: Secondary | ICD-10-CM | POA: Diagnosis not present

## 2017-10-22 DIAGNOSIS — J209 Acute bronchitis, unspecified: Secondary | ICD-10-CM

## 2017-10-22 MED ORDER — LEVOFLOXACIN 750 MG PO TABS: 750.0000 mg | ORAL_TABLET | Freq: Every day | ORAL | 0 refills | Status: DC

## 2017-10-22 NOTE — Progress Notes (Signed)
patient c/o continued bronchitis. Increased levofloxacin to 750mg  daily for 10 days. Send to [facility]. Use neb treatments as needed and as prescribed for wheezing, cough, and SOB.

## 2017-10-22 NOTE — Telephone Encounter (Signed)
patient c/o continued bronchitis. Increased levofloxacin to 750mg  daily for 10 days. Send to [facility]. Use neb treatments as needed and as prescribed for wheezing, cough, and SOB.

## 2017-10-25 ENCOUNTER — Encounter: Payer: Self-pay | Admitting: Nurse Practitioner

## 2017-10-25 ENCOUNTER — Encounter: Payer: Medicare Other | Admitting: Nurse Practitioner

## 2017-10-25 ENCOUNTER — Ambulatory Visit (INDEPENDENT_AMBULATORY_CARE_PROVIDER_SITE_OTHER): Payer: Medicare Other | Admitting: Nurse Practitioner

## 2017-10-25 VITALS — BP 159/83 | HR 76 | Temp 96.1°F | Resp 18 | Ht 66.0 in | Wt 303.6 lb

## 2017-10-25 DIAGNOSIS — R062 Wheezing: Secondary | ICD-10-CM | POA: Diagnosis not present

## 2017-10-25 DIAGNOSIS — G894 Chronic pain syndrome: Secondary | ICD-10-CM | POA: Diagnosis not present

## 2017-10-25 DIAGNOSIS — E11622 Type 2 diabetes mellitus with other skin ulcer: Secondary | ICD-10-CM | POA: Diagnosis not present

## 2017-10-25 DIAGNOSIS — J440 Chronic obstructive pulmonary disease with acute lower respiratory infection: Secondary | ICD-10-CM | POA: Diagnosis not present

## 2017-10-25 DIAGNOSIS — J209 Acute bronchitis, unspecified: Secondary | ICD-10-CM

## 2017-10-25 DIAGNOSIS — E1142 Type 2 diabetes mellitus with diabetic polyneuropathy: Secondary | ICD-10-CM | POA: Diagnosis not present

## 2017-10-25 MED ORDER — IPRATROPIUM-ALBUTEROL 0.5-2.5 (3) MG/3ML IN SOLN
3.0000 mL | Freq: Once | RESPIRATORY_TRACT | Status: AC
  Administered 2017-10-25: 3 mL via RESPIRATORY_TRACT

## 2017-10-25 MED ORDER — FENTANYL 50 MCG/HR TD PT72: 50.0000 ug | MEDICATED_PATCH | TRANSDERMAL | 0 refills | Status: DC

## 2017-10-25 MED ORDER — METHYLPREDNISOLONE 4 MG PO TBPK: ORAL_TABLET | ORAL | 0 refills | Status: DC

## 2017-10-25 MED ORDER — OXYCODONE HCL 10 MG PO TABS: 10.0000 mg | ORAL_TABLET | Freq: Three times a day (TID) | ORAL | 0 refills | Status: DC | PRN

## 2017-10-25 NOTE — Progress Notes (Signed)
[facility] [place], [place] [id number]  Internal MEDICINE  Office Visit Note  Patient Name: Adam Merritt  [id number]  [id number]  Date of Service: 10/25/2017   Pt is here for a sick visit.   Chief Complaint  Patient presents with  . Bronchitis     Patient having cough, congestion, and shortness of breath for over 2 weeks. Finished levofloxacin 500mg  daily, today. Has already been prescribed levofloxacin 750mg  daily, but has not started this yet. He is using nebulizer 3 to 4 times per day. Does improve cough and breathing. Denies fever, chills, or body aches.       Current Medication:  Outpatient Encounter Medications as of 10/25/2017  Medication Sig Note  . levofloxacin (LEVAQUIN) 750 MG tablet Take 1 tablet (750 mg total) by mouth daily.   Marland Kitchen albuterol (PROVENTIL HFA;VENTOLIN HFA) 108 (90 Base) MCG/ACT inhaler Inhale 1 puff into the lungs every 6 (six) hours as needed for wheezing or shortness of breath.   Marland Kitchen amoxicillin-clavulanate (AUGMENTIN) 875-125 MG tablet Take 1 tablet by mouth 2 (two) times daily. (Patient not taking: Reported on 10/25/2017)   . aspirin 325 MG EC tablet Take 325 mg by mouth daily.   Marland Kitchen atorvastatin (LIPITOR) 40 MG tablet Take 40 mg by mouth at bedtime.    . carvedilol (COREG) 25 MG tablet Take 25 mg by mouth 2 (two) times daily with a meal.   . colchicine 0.6 MG tablet Take 1 tablet (0.6 mg total) by mouth daily.   . febuxostat (ULORIC) 40 MG tablet Take one tab a day for gout, cannot take allopurinol (Patient taking differently: Take 40 mg by mouth daily. )   . fentaNYL (DURAGESIC - DOSED MCG/HR) 50 MCG/HR Place 1 patch (50 mcg total) onto the skin every 3 (three) days.   . furosemide (LASIX) 40 MG tablet Take one tablet (40mg ) by mouth daily   . gabapentin (NEURONTIN) 400 MG capsule Take 400 mg by mouth 3 (three) times daily.    . hydrALAZINE (APRESOLINE) 25 MG tablet Take 25 mg by mouth 3 (three) times daily.   Marland Kitchen ibuprofen  (ADVIL,MOTRIN) 600 MG tablet Take 600 mg by mouth 2 (two) times daily as needed for mild pain or moderate pain.    . indapamide (LOZOL) 2.5 MG tablet Take 1 tablet (2.5 mg total) by mouth daily.   . Insulin Glargine (BASAGLAR KWIKPEN) 100 UNIT/ML SOPN Inject 30 Units into the skin at bedtime.   Marland Kitchen ipratropium-albuterol (DUONEB) 0.5-2.5 (3) MG/3ML SOLN Take 3 mLs by nebulization every 4 (four) hours as needed (for shortness of breath).   . Liraglutide (VICTOZA) 18 MG/3ML SOPN Inject 1.8 mg into the skin daily.    . methylPREDNISolone (MEDROL) 4 MG TBPK tablet Take by mouth as directed for 6 days   . Multiple Vitamin (MULTIVITAMIN WITH MINERALS) TABS tablet Take 1 tablet by mouth daily.   Marland Kitchen omeprazole (PRILOSEC) 20 MG capsule Take 20 mg by mouth daily.   . Oxycodone HCl 10 MG TABS Take 1 tablet (10 mg total) by mouth 3 (three) times daily as needed.   . pentoxifylline (TRENTAL) 400 MG CR tablet Take 400 mg by mouth 3 (three) times daily.   . sennosides-docusate sodium (SENOKOT-S) 8.6-50 MG tablet Take 1-2 tablets by mouth 2 (two) times daily.   Marland Kitchen UREA 20 INTENSIVE HYDRATING 20 % cream USE AS DIRECTED 3 TIMES A WEEK   . zolpidem (AMBIEN CR) 12.5 MG CR tablet Take 1 tablet (12.5  mg total) by mouth at bedtime as needed for sleep.   . [DISCONTINUED] fentaNYL (DURAGESIC - DOSED MCG/HR) 50 MCG/HR Place 1 patch (50 mcg total) onto the skin every 3 (three) days. 09/20/2017: Patient currently NOT wearing patch. Patient's wife has a patch  . [DISCONTINUED] Oxycodone HCl 10 MG TABS Take 1 tablet (10 mg total) by mouth 3 (three) times daily as needed.   . [EXPIRED] ipratropium-albuterol (DUONEB) 0.5-2.5 (3) MG/3ML nebulizer solution 3 mL     No facility-administered encounter medications on file as of 10/25/2017.       Medical History: Past Medical History:  Diagnosis Date  . Anemia   . CAD (coronary artery disease)   . Chest pain   . Coronary artery disease   . Diabetes mellitus without complication  ([place])   . Esophageal reflux   . Herpes zoster without mention of complication   . Hyperlipidemia   . Hypertension   . Insomnia   . Lumbago   . Neuropathy in diabetes ([place])   . Obstructive chronic bronchitis without exacerbation ([place])   . Other malaise and fatigue   . Stroke ([place])   . Varicose veins      Vital Signs: BP (!) 159/83   Pulse 76   Temp (!) 96.1 F (35.6 C)   Resp 18   Ht 5\' 6"  (1.676 m)   Wt (!) 303 lb 9.6 oz (137.7 kg)   SpO2 96%   BMI 49.00 kg/m    Review of Systems  Constitutional: Positive for activity change and fatigue.  HENT: Positive for congestion and postnasal drip.   Eyes: Negative.   Respiratory: Positive for cough, chest tightness, shortness of breath and wheezing.   Cardiovascular: Positive for leg swelling. Negative for chest pain and palpitations.  Gastrointestinal: Negative for constipation, diarrhea, nausea and vomiting.  Endocrine:       Blood sugars doing well   Genitourinary: Negative for flank pain, frequency and urgency.  Musculoskeletal: Positive for arthralgias, back pain and myalgias.  Skin: Negative.   Allergic/Immunologic: Positive for environmental allergies.  Neurological: Negative for dizziness, light-headedness and headaches.  Hematological: Negative for adenopathy.  Psychiatric/Behavioral: Negative for agitation and dysphoric mood. The patient is not nervous/anxious.     Physical Exam  Constitutional: He is oriented to person, place, and time. He appears well-developed and well-nourished. He appears ill. No distress.  HENT:  Head: Normocephalic and atraumatic.  Mouth/Throat: Oropharynx is clear and moist. No oropharyngeal exudate.  Eyes: Pupils are equal, round, and reactive to light. EOM are normal.  Neck: Normal range of motion. Neck supple. No JVD present. No tracheal deviation present. No thyromegaly present.  Cardiovascular: Normal rate, regular rhythm and normal heart sounds. Exam reveals no gallop and no friction  rub.  No murmur heard. Pulmonary/Chest: Accessory muscle usage present. No respiratory distress. He has wheezes. He has rhonchi. He has no rales. He exhibits no tenderness.  Congested, non-productive cough present.   Abdominal: Soft. Bowel sounds are normal.  Musculoskeletal: Normal range of motion.  Moderate to severe lower leg pain, bilaterally. Worse when standing and putting pressure on both feet. Walking increases pain.    Lymphadenopathy:    He has no cervical adenopathy.  Neurological: He is alert and oriented to person, place, and time. No cranial nerve deficit.  Skin: Skin is warm and dry. He is not diaphoretic.  Psychiatric: He has a normal mood and affect. His behavior is normal. Judgment and thought content normal.  Nursing note and vitals  reviewed.  Assessment/Plan: 1. Acute bronchitis with COPD ([place]) Patient to start levofloxacin 750mg  daily today. Will take for 10 days. Add medrol 4mg , six day dose pack. Breathing treatment wit hDuoNeb administered in the office. Patient reporting mild improvement in symptoms after treatment.  - ipratropium-albuterol (DUONEB) 0.5-2.5 (3) MG/3ML nebulizer solution 3 mL - methylPREDNISolone (MEDROL) 4 MG TBPK tablet; Take by mouth as directed for 6 days  Dispense: 21 tablet; Refill: 0  2. Wheezing Patient given DuoNeb breathing treatment in the office. Mild improvement in symptoms after treatment. Patient tolerated well  3. Diabetic polyneuropathy associated with type 2 diabetes mellitus ([place]) Patient to closely monitor blood sugars while taking medrol dose pack. Sliding scale insulin to treat hyperglycemia. Contact the office if blood sugars over 400 and no improvement after insulin injection.   4. Chronic pain disorder Renew fentanyl and oxycodone. No dosing changes.  - Oxycodone HCl 10 MG TABS; Take 1 tablet (10 mg total) by mouth 3 (three) times daily as needed.  Dispense: 90 tablet; Refill: 0 - fentaNYL (DURAGESIC - DOSED MCG/HR) 50  MCG/HR; Place 1 patch (50 mcg total) onto the skin every 3 (three) days.  Dispense: 10 patch; Refill: 0  General Counseling: Tranquilino verbalizes understanding of the findings of todays visit and agrees with plan of treatment. I have discussed any further diagnostic evaluation that may be needed or ordered today. We also reviewed his medications today. he has been encouraged to call the office with any questions or concerns that should arise related to todays visit.   Rest and increase fluids. Continue using OTC medication to control symptoms.    Meds ordered this encounter  Medications  . ipratropium-albuterol (DUONEB) 0.5-2.5 (3) MG/3ML nebulizer solution 3 mL  . Oxycodone HCl 10 MG TABS    Sig: Take 1 tablet (10 mg total) by mouth 3 (three) times daily as needed.    Dispense:  90 tablet    Refill:  0    Order Specific Question:   Supervising Provider    Answer:   [name] [[id number]]  . methylPREDNISolone (MEDROL) 4 MG TBPK tablet    Sig: Take by mouth as directed for 6 days    Dispense:  21 tablet    Refill:  0    Order Specific Question:   Supervising Provider    Answer:   [name] [place]  . fentaNYL (DURAGESIC - DOSED MCG/HR) 50 MCG/HR    Sig: Place 1 patch (50 mcg total) onto the skin every 3 (three) days.    Dispense:  10 patch    Refill:  0    Order Specific Question:   Supervising Provider    Answer:   [name] [[id number]]    Time spent: 25 Minutes

## 2017-10-27 NOTE — Progress Notes (Signed)
Adam, Merritt ([id number]) Visit Report for 10/25/2017 Arrival Information Details Patient Name: Adam Merritt, Adam Merritt Date of Service: 10/25/2017 12:30 PM Medical Record Number: [id number] Patient Account Number: [id number] Date of Birth/Sex: 05-20-1946 (72 y.o. M) Treating RN: Cornell Barman Primary Care Ileigh Mettler: [name] Other Clinician: Referring Male Iafrate: [name] Treating Samiyah Stupka/Extender: Cathie Olden in Treatment: 1 Visit Information History Since Last Visit Added or deleted any medications: Yes Patient Arrived: Cane Any new allergies or adverse reactions: No Arrival Time: 12:40 Had a fall or experienced change in No Accompanied By: wife activities of daily living that may affect Transfer Assistance: None risk of falls: Patient Identification Verified: Yes Signs or symptoms of abuse/neglect since last visito No Secondary Verification Process Completed: Yes Hospitalized since last visit: No Patient Has Alerts: Yes Implantable device outside of the clinic excluding No Patient Alerts: DMII cellular tissue based products placed in the center since last visit: Has Dressing in Place as Prescribed: No Has Compression in Place as Prescribed: No Pain Present Now: No Electronic Signature(s) Signed: 10/25/2017 5:02:07 PM By: Gretta Cool, BSN, RN, CWS, Kim RN, BSN Entered By: Gretta Cool, BSN, RN, CWS, Kim on 10/25/2017 12:41:55 Adam Merritt ([id number]) -------------------------------------------------------------------------------- Compression Therapy Details Patient Name: Adam Merritt Date of Service: 10/25/2017 12:30 PM Medical Record Number: [id number] Patient Account Number: [id number] Date of Birth/Sex: 07-03-1945 (72 y.o. M) Treating RN: Roger Shelter Primary Care Jyaire Koudelka: [name] Other Clinician: Referring Laporchia Nakajima: [name] Treating Kathlene Yano/Extender: Cathie Olden in Treatment: 1 Compression Therapy Performed for Wound Assessment: Wound #10  Left,Anterior Lower Leg Performed By: Clinician Roger Shelter, RN Compression Type: Three Layer Post Procedure Diagnosis Same as Pre-procedure Electronic Signature(s) Signed: 10/25/2017 4:33:19 PM By: Roger Shelter Entered By: Roger Shelter on 10/25/2017 13:01:08 Adam Merritt ([id number]) -------------------------------------------------------------------------------- Compression Therapy Details Patient Name: Adam Merritt Date of Service: 10/25/2017 12:30 PM Medical Record Number: [id number] Patient Account Number: [id number] Date of Birth/Sex: Sep 22, 1945 (72 y.o. M) Treating RN: Roger Shelter Primary Care Landis Dowdy: [name] Other Clinician: Referring Mckynna Vanloan: [name] Treating Jeziel Hoffmann/Extender: Cathie Olden in Treatment: 1 Compression Therapy Performed for Wound Assessment: Wound #11 Left,Medial Lower Leg Performed By: Clinician Roger Shelter, RN Compression Type: Three Layer Post Procedure Diagnosis Same as Pre-procedure Electronic Signature(s) Signed: 10/25/2017 4:33:19 PM By: Roger Shelter Entered By: Roger Shelter on 10/25/2017 13:01:09 Adam Merritt ([id number]) -------------------------------------------------------------------------------- Compression Therapy Details Patient Name: Adam Merritt Date of Service: 10/25/2017 12:30 PM Medical Record Number: [id number] Patient Account Number: [id number] Date of Birth/Sex: 1945-11-22 (72 y.o. M) Treating RN: Roger Shelter Primary Care Reginald Mangels: [name] Other Clinician: Referring Keidrick Murty: [name] Treating Tra Wilemon/Extender: Cathie Olden in Treatment: 1 Compression Therapy Performed for Wound Assessment: Wound #12 Left,Posterior Lower Leg Performed By: Clinician Roger Shelter, RN Compression Type: Three Layer Post Procedure Diagnosis Same as Pre-procedure Electronic Signature(s) Signed: 10/25/2017 4:33:19 PM By: Roger Shelter Entered By: Roger Shelter on  10/25/2017 13:01:09 Adam Merritt ([id number]) -------------------------------------------------------------------------------- Encounter Discharge Information Details Patient Name: Adam Merritt Date of Service: 10/25/2017 12:30 PM Medical Record Number: [id number] Patient Account Number: [id number] Date of Birth/Sex: 29-May-1946 (72 y.o. M) Treating RN: Cornell Barman Primary Care Fread Kottke: [name] Other Clinician: Referring Jayten Gabbard: [name] Treating Gaynor Ferreras/Extender: Cathie Olden in Treatment: 1 Encounter Discharge Information Items Discharge Condition: Stable Ambulatory Status: Ambulatory Discharge Destination: Home Transportation: Private Auto Accompanied By: wife Schedule Follow-up Appointment: Yes Clinical Summary of Care: Electronic Signature(s) Signed: 10/25/2017 4:46:39 PM By: Gretta Cool, BSN, RN, CWS, Kim RN,  BSN Entered By: Gretta Cool, BSN, RN, CWS, Kim on 10/25/2017 16:46:39 Adam Merritt ([id number]) -------------------------------------------------------------------------------- Lower Extremity Assessment Details Patient Name: Adam Merritt Date of Service: 10/25/2017 12:30 PM Medical Record Number: [id number] Patient Account Number: [id number] Date of Birth/Sex: Feb 28, 1946 (72 y.o. M) Treating RN: Cornell Barman Primary Care Westin Knotts: [name] Other Clinician: Referring Shaye Elling: [name] Treating Gerrick Ray/Extender: Cathie Olden in Treatment: 1 Edema Assessment Assessed: [Left: No] [Right: No] [Left: Edema] [Right: :] Calf Left: Right: Point of Measurement: 32 cm From Medial Instep 58.7 cm 52.6 cm Ankle Left: Right: Point of Measurement: 11 cm From Medial Instep 40.7 cm 35 cm Vascular Assessment Pulses: Dorsalis Pedis Palpable: [Left:Yes] [Right:Yes] Posterior Tibial Extremity colors, hair growth, and conditions: Extremity Color: [Left:Hyperpigmented] [Right:Hyperpigmented] Hair Growth on Extremity: [Left:No] [Right:No] Temperature of  Extremity: [Left:Warm] [Right:Warm] Capillary Refill: [Left:< 3 seconds] [Right:< 3 seconds] Toe Nail Assessment Left: Right: Thick: Yes Yes Discolored: Yes Yes Deformed: No No Improper Length and Hygiene: No No Electronic Signature(s) Signed: 10/25/2017 5:02:07 PM By: Gretta Cool, BSN, RN, CWS, Kim RN, BSN Entered By: Gretta Cool, BSN, RN, CWS, Kim on 10/25/2017 12:50:34 Adam Merritt ([id number]) -------------------------------------------------------------------------------- Multi Wound Chart Details Patient Name: Adam Merritt Date of Service: 10/25/2017 12:30 PM Medical Record Number: [id number] Patient Account Number: [id number] Date of Birth/Sex: 1945-10-12 (72 y.o. 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] [11:Diabetic Wound/Ulcer of the Lower Extremity] [12:Diabetic Wound/Ulcer of the Lower Extremity] Secondary Etiology: [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] [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] [12:Venous Leg Ulcer] Comorbid History: [10:Anemia, Lymphedema, Chronic Obstructive Pulmonary Disease (COPD), Sleep Apnea, Congestive Heart Failure, Coronary Artery Disease, Hypertension, Peripheral Venous Disease, Type II Diabetes, Osteoarthritis, Neuropathy, Received  Chemotherapy] [11:Anemia, Lymphedema, Chronic Obstructive Pulmonary Disease (COPD), Sleep Apnea, Congestive Heart Failure, Coronary Artery Disease, Hypertension, Peripheral  Venous Disease, Type II Diabetes, Osteoarthritis, Neuropathy, Received  Chemotherapy] [12:Anemia, Lymphedema, Chronic Obstructive Pulmonary Disease (COPD), Sleep Apnea, Congestive Heart Failure, Coronary Artery Disease, Hypertension, Peripheral Venous Disease, Type II Diabetes, Osteoarthritis, Neuropathy, Received  Chemotherapy] Date Acquired: [10:10/01/2017] [11:10/01/2017] [12:10/01/2017] Weeks of Treatment: [10:1] [11:1] [12:1] Wound Status: [10:Open] [11:Open] [12:Open] Measurements L x W x D [10:0.1x0.1x0.1] [11:1x2x0.1] [12:2.2x1.8x0.1] (cm) Area (cm) : [10:0.008] [11:1.571] [12:3.11] Volume (cm) : [10:0.001] [11:0.157] [12:0.311] % Reduction in Area: [10:99.70%] [11:70.90%] [12:13.20%] % Reduction in Volume: [10:99.60%] [11:70.90%] [12:13.10%] Classification: [10:Grade 1] [11:Grade 1] [12:Grade 1] Exudate Amount: [10:Medium] [11:Large] [12:Large] Exudate Type: [10:Serous] [11:Serous] [12:Serous] Exudate Color: [10:amber] [11:amber] [12:amber] Wound Margin: [10:Flat and Intact] [11:Flat and Intact] [12:Flat and Intact] Granulation Amount: [10:Medium (34-66%)] [11:Medium (34-66%)] [12:Large (67-100%)] Granulation Quality: [10:Red] [11:Red] [12:Red] Necrotic Amount: [10:Medium (34-66%)] [11:Medium (34-66%)] [12:Small (1-33%)] Exposed Structures: [10:Fat Layer (Subcutaneous Tissue) Exposed: Yes Fascia: No Tendon: No] [11:Fat Layer (Subcutaneous Tissue) Exposed: Yes Fascia: No Tendon: No] [12:Fat Layer (Subcutaneous Tissue) Exposed: Yes Fascia: No Tendon: No] Muscle: No Muscle: No Muscle: No Joint: No Joint: No Joint: No Bone: No Bone: No Bone: No Epithelialization: Small (1-33%) Small (1-33%) None Periwound Skin Texture: Excoriation: No Excoriation: No Excoriation: No Induration: No Induration: No Induration: No Callus: No Callus: No Callus: No  Crepitus: No Crepitus: No Crepitus: No Rash: No Rash: No Rash: No Scarring: No Scarring: No Scarring: No Periwound Skin  Moisture: Maceration: No Maceration: No Maceration: No Dry/Scaly: No Dry/Scaly: No Dry/Scaly: No Periwound Skin Color: Hemosiderin Staining: Yes Hemosiderin Staining: Yes Hemosiderin Staining: Yes Atrophie Blanche: No Atrophie Blanche: No Atrophie Blanche: No Cyanosis: No Cyanosis: No Cyanosis: No Ecchymosis: No Ecchymosis: No Ecchymosis: No Erythema: No Erythema: No Erythema: No Mottled: No Mottled: No Mottled: No Pallor: No Pallor: No Pallor: No Rubor: No Rubor: No Rubor: No Temperature: No Abnormality No Abnormality No Abnormality Tenderness on Palpation: Yes Yes No Wound Preparation: Ulcer Cleansing: Ulcer Cleansing: Ulcer Cleansing: Rinsed/Irrigated with Saline Rinsed/Irrigated with Saline Rinsed/Irrigated with Saline Topical Anesthetic Applied: Topical Anesthetic Applied: Topical Anesthetic Applied: Other: lidocaine 4% Other: lidocaine 4% Other: lidocaine 4% Procedures Performed: Compression Therapy Compression Therapy Compression Therapy Wound Number: 9 N/A N/A Photos: No Photos N/A N/A Wound Location: Right, Anterior Lower Leg N/A N/A Wounding Event: Blister N/A N/A Primary Etiology: Diabetic Wound/Ulcer of the N/A N/A Lower Extremity Secondary Etiology: Venous Leg Ulcer N/A N/A Comorbid History: N/A N/A N/A Date Acquired: 10/01/2017 N/A N/A Weeks of Treatment: 1 N/A N/A Wound Status: Open N/A N/A Measurements L x W x D 0x0x0 N/A N/A (cm) Area (cm) : 0 N/A N/A Volume (cm) : 0 N/A N/A % Reduction in Area: 100.00% N/A N/A % Reduction in Volume: 100.00% N/A N/A Classification: Grade 1 N/A N/A Exudate Amount: N/A N/A N/A Exudate Type: N/A N/A N/A Exudate Color: N/A N/A N/A Wound Margin: N/A N/A N/A Granulation Amount: N/A N/A N/A Granulation Quality: N/A N/A N/A Necrotic Amount: N/A N/A N/A Exposed Structures: N/A N/A N/A Epithelialization: N/A N/A N/A Periwound Skin Texture: No Abnormalities Noted N/A N/A ROLONDO, PIERRE  ([id number]) Periwound Skin Moisture: No Abnormalities Noted N/A N/A Periwound Skin Color: No Abnormalities Noted N/A N/A Temperature: N/A N/A N/A Tenderness on Palpation: No N/A N/A Wound Preparation: N/A N/A N/A Procedures Performed: N/A N/A N/A Treatment Notes Electronic Signature(s) Signed: 10/25/2017 1:02:11 PM By: Lawanda Cousins Entered By: Lawanda Cousins on 10/25/2017 13:02:10 Adam Merritt ([id number]) -------------------------------------------------------------------------------- [facility] Details Patient Name: Adam Merritt Date of Service: 10/25/2017 12:30 PM Medical Record Number: [id number] Patient Account Number: [id number] Date of Birth/Sex: 03-08-1946 (72 y.o. M) Treating RN: Roger Shelter Primary Care Tamea Bai: [name] Other Clinician: Referring Niv Darley: [name] Treating Nitesh Pitstick/Extender: Cathie Olden in Treatment: 1 Active Inactive ` Abuse / Safety / Falls / Self Care Management Nursing Diagnoses: Potential for falls Goals: Patient will not experience any injury related to falls Date Initiated: 10/18/2017 Target Resolution Date: 02/09/2018 Goal Status: Active Interventions: Assess Activities of Daily Living upon admission and as needed Assess fall risk on admission and as needed Assess: immobility, friction, shearing, incontinence upon admission and as needed Assess impairment of mobility on admission and as needed per policy Assess personal safety and home safety (as indicated) on admission and as needed Assess self care needs on admission and as needed Notes: ` Orientation to the Wound Care Program Nursing Diagnoses: Knowledge deficit related to the wound healing center program Goals: Patient/caregiver will verbalize understanding of the [facility] Date Initiated: 10/18/2017 Target Resolution Date: 11/10/2017 Goal Status: Active Interventions: Provide education on orientation to the wound  center Notes: ` Wound/Skin Impairment Nursing Diagnoses: Impaired tissue integrity Knowledge deficit related to ulceration/compromised skin integrity DEOVION, BATREZ ([id number]) Goals: Ulcer/skin breakdown will have a volume reduction of 80% by week 12  Date Initiated: 10/18/2017 Target Resolution Date: 01/05/2018 Goal Status: Active Interventions: Assess patient/caregiver ability to perform ulcer/skin care regimen upon admission and as needed Assess ulceration(s) every visit Notes: Electronic Signature(s) Signed: 10/25/2017 4:33:19 PM By: Roger Shelter Entered By: Roger Shelter on 10/25/2017 12:55:46 Adam Merritt ([id number]) -------------------------------------------------------------------------------- Pain Assessment Details Patient Name: Adam Merritt Date of Service: 10/25/2017 12:30 PM Medical Record Number: [id number] Patient Account Number: [id number] Date of Birth/Sex: 1945-06-23 (72 y.o. M) Treating RN: Cornell Barman Primary Care Marcelle Bebout: [name] Other Clinician: Referring Darlena Koval: [name] Treating Mercedies Ganesh/Extender: Cathie Olden in Treatment: 1 Active Problems Location of Pain Severity and Description of Pain Patient Has Paino No Site Locations With Dressing Change: No Pain Management and Medication Current Pain Management: Goals for Pain Management Topical or injectable lidocaine is offered to patient for acute pain when surgical debridement is performed. If needed, Patient is instructed to use over the counter pain medication for the following 24-48 hours after debridement. Wound care MDs do not prescribed pain medications. Patient has chronic pain or uncontrolled pain. Patient has been instructed to make an appointment with their Primary Care Physician for pain management. Electronic Signature(s) Signed: 10/25/2017 5:02:07 PM By: Gretta Cool, BSN, RN, CWS, Kim RN, BSN Entered By: Gretta Cool, BSN, RN, CWS, Kim on 10/25/2017 12:42:04 Adam Merritt  ([id number]) -------------------------------------------------------------------------------- Patient/Caregiver Education Details Patient Name: Adam Merritt Date of Service: 10/25/2017 12:30 PM Medical Record Number: [id number] Patient Account Number: [id number] Date of Birth/Gender: 08/28/1945 (72 y.o. M) Treating RN: Cornell Barman Primary Care Physician: [name] Other Clinician: Referring Physician: [name] Treating Physician/Extender: Cathie Olden in Treatment: 1 Education Assessment Education Provided To: Patient Education Topics Provided Venous: Handouts: Controlling Swelling with Multilayered Compression Wraps, Other: wound care as prescribed Methods: Demonstration, Explain/Verbal Responses: State content correctly Electronic Signature(s) Signed: 10/25/2017 5:02:07 PM By: Gretta Cool, BSN, RN, CWS, Kim RN, BSN Entered By: Gretta Cool, BSN, RN, CWS, Kim on 10/25/2017 16:47:16 Adam Merritt ([id number]) -------------------------------------------------------------------------------- Wound Assessment Details Patient Name: Adam Merritt Date of Service: 10/25/2017 12:30 PM Medical Record Number: [id number] Patient Account Number: [id number] Date of Birth/Sex: 1945-11-12 (72 y.o. M) Treating RN: Cornell Barman Primary Care Mayur Duman: [name] Other Clinician: Referring Lacara Dunsworth: [name] Treating Kerrilyn Azbill/Extender: Lawanda Cousins [name]: 1 Wound Status Wound Number: 10 Primary Diabetic Wound/Ulcer of the Lower Extremity Etiology: Wound Location: Left Lower Leg - Anterior Secondary Venous Leg Ulcer Wounding Event: Blister Etiology: Date Acquired: 10/01/2017 Wound Open Weeks Of Treatment: 1 Status: Clustered Wound: No Comorbid Anemia, Lymphedema, Chronic Obstructive History: Pulmonary Disease (COPD), Sleep Apnea, Congestive Heart Failure, Coronary Artery Disease, Hypertension, Peripheral Venous Disease, Type II Diabetes, Osteoarthritis, Neuropathy,  Received Chemotherapy Photos Photo Uploaded By: Gretta Cool, BSN, RN, CWS, Kim on 10/25/2017 17:13:57 Wound Measurements Length: (cm) 0.1 Width: (cm) 0.1 Depth: (cm) 0.1 Area: (cm) 0.008 Volume: (cm) 0.001 % Reduction in Area: 99.7% % Reduction in Volume: 99.6% Epithelialization: Small (1-33%) Tunneling: No Undermining: No Wound Description Classification: Grade 1 Foul Odor Wound Margin: Flat and Intact Slough/Fi Exudate Amount: Medium Exudate Type: Serous Exudate Color: amber After Cleansing: No brino Yes Wound Bed Granulation Amount: Medium (34-66%) Exposed Structure Granulation Quality: Red Fascia Exposed: No Necrotic Amount: Medium (34-66%) Fat Layer (Subcutaneous Tissue) Exposed: Yes Necrotic Quality: Adherent Slough Tendon Exposed: No KOLBI, ALTADONNA ([id number]) Muscle Exposed: No Joint Exposed: No Bone Exposed: No Periwound Skin Texture Texture Color No Abnormalities Noted: No No Abnormalities Noted: No Callus: No Atrophie Blanche: No Crepitus: No Cyanosis: No  Excoriation: No Ecchymosis: No Induration: No Erythema: No Rash: No Hemosiderin Staining: Yes Scarring: No Mottled: No Pallor: No Moisture Rubor: No No Abnormalities Noted: No Dry / Scaly: No Temperature / Pain Maceration: No Temperature: No Abnormality Tenderness on Palpation: Yes Wound Preparation Ulcer Cleansing: Rinsed/Irrigated with Saline Topical Anesthetic Applied: Other: lidocaine 4%, Treatment Notes Wound #10 (Left, Anterior Lower Leg) 1. Cleansed with: Clean wound with Normal Saline 2. Anesthetic Topical Lidocaine 4% cream to wound bed prior to debridement 3. Peri-wound Care: Moisturizing lotion 4. Dressing Applied: Hydrafera Blue 5. Secondary Dressing Applied ABD Pad 7. Secured with 3 Layer Compression System - Bilateral Electronic Signature(s) Signed: 10/25/2017 5:02:07 PM By: Gretta Cool, BSN, RN, CWS, Kim RN, BSN Entered By: Gretta Cool, BSN, RN, CWS, Kim on 10/25/2017  12:50:49 Adam Merritt ([id number]) -------------------------------------------------------------------------------- Wound Assessment Details Patient Name: Adam Merritt Date of Service: 10/25/2017 12:30 PM Medical Record Number: [id number] Patient Account Number: [id number] Date of Birth/Sex: [birth year] (72 y.o. M) Treating RN: Cornell Barman Primary Care Guerin Lashomb: [name] Other Clinician: Referring Taneia Mealor: [name] Treating Tiffani Kadow/Extender: Lawanda Cousins [name]: 1 Wound Status Wound Number: 11 Primary Diabetic Wound/Ulcer of the Lower Extremity Etiology: Wound Location: Left Lower Leg - Medial Secondary Venous Leg Ulcer Wounding Event: Blister Etiology: Date Acquired: 10/01/2017 Wound Open Weeks Of Treatment: 1 Status: Clustered Wound: No Comorbid Anemia, Lymphedema, Chronic Obstructive History: Pulmonary Disease (COPD), Sleep Apnea, Congestive Heart Failure, Coronary Artery Disease, Hypertension, Peripheral Venous Disease, Type II Diabetes, Osteoarthritis, Neuropathy, Received Chemotherapy Photos Photo Uploaded By: Gretta Cool, BSN, RN, CWS, Kim on 10/25/2017 17:13:57 Wound Measurements Length: (cm) 1 Width: (cm) 2 Depth: (cm) 0.1 Area: (cm) 1.571 Volume: (cm) 0.157 % Reduction in Area: 70.9% % Reduction in Volume: 70.9% Epithelialization: Small (1-33%) Tunneling: No Undermining: No Wound Description Classification: Grade 1 Foul Odor Wound Margin: Flat and Intact Slough/Fi Exudate Amount: Large Exudate Type: Serous Exudate Color: amber After Cleansing: No brino Yes Wound Bed Granulation Amount: Medium (34-66%) Exposed Structure Granulation Quality: Red Fascia Exposed: No Necrotic Amount: Medium (34-66%) Fat Layer (Subcutaneous Tissue) Exposed: Yes Necrotic Quality: Adherent Slough Tendon Exposed: No TSUNEO, FAISON ([id number]) Muscle Exposed: No Joint Exposed: No Bone Exposed: No Periwound Skin Texture Texture Color No Abnormalities  Noted: No No Abnormalities Noted: No Callus: No Atrophie Blanche: No Crepitus: No Cyanosis: No Excoriation: No Ecchymosis: No Induration: No Erythema: No Rash: No Hemosiderin Staining: Yes Scarring: No Mottled: No Pallor: No Moisture Rubor: No No Abnormalities Noted: No Dry / Scaly: No Temperature / Pain Maceration: No Temperature: No Abnormality Tenderness on Palpation: Yes Wound Preparation Ulcer Cleansing: Rinsed/Irrigated with Saline Topical Anesthetic Applied: Other: lidocaine 4%, Treatment Notes Wound #11 (Left, Medial Lower Leg) 1. Cleansed with: Clean wound with Normal Saline 2. Anesthetic Topical Lidocaine 4% cream to wound bed prior to debridement 3. Peri-wound Care: Moisturizing lotion 4. Dressing Applied: Hydrafera Blue 5. Secondary Dressing Applied ABD Pad 7. Secured with 3 Layer Compression System - Bilateral Electronic Signature(s) Signed: 10/25/2017 5:02:07 PM By: Gretta Cool, BSN, RN, CWS, Kim RN, BSN Entered By: Gretta Cool, BSN, RN, CWS, Kim on 10/25/2017 12:51:02 Adam Merritt ([id number]) -------------------------------------------------------------------------------- Wound Assessment Details Patient Name: Adam Merritt Date of Service: 10/25/2017 12:30 PM Medical Record Number: [id number] Patient Account Number: [id number] Date of Birth/Sex: [birth year] (72 y.o. M) Treating RN: Cornell Barman Primary Care Bennye Nix: [name] Other Clinician: Referring Samhita Kretsch: [name] Treating Cleven Jansma/Extender: Lawanda Cousins [name]: 1 Wound Status Wound Number: 12 Primary Diabetic Wound/Ulcer of the Lower Extremity  Etiology: Wound Location: Left Lower Leg - Posterior Secondary Venous Leg Ulcer Wounding Event: Blister Etiology: Date Acquired: 10/01/2017 Wound Open Weeks Of Treatment: 1 Status: Clustered Wound: No Comorbid Anemia, Lymphedema, Chronic Obstructive History: Pulmonary Disease (COPD), Sleep Apnea, Congestive Heart Failure, Coronary  Artery Disease, Hypertension, Peripheral Venous Disease, Type II Diabetes, Osteoarthritis, Neuropathy, Received Chemotherapy Photos Photo Uploaded By: Gretta Cool, BSN, RN, CWS, Kim on 10/25/2017 17:14:44 Wound Measurements Length: (cm) 2.2 Width: (cm) 1.8 Depth: (cm) 0.1 Area: (cm) 3.11 Volume: (cm) 0.311 % Reduction in Area: 13.2% % Reduction in Volume: 13.1% Epithelialization: None Tunneling: No Undermining: No Wound Description Classification: Grade 1 Foul Odor Wound Margin: Flat and Intact Slough/Fi Exudate Amount: Large Exudate Type: Serous Exudate Color: amber After Cleansing: No brino Yes Wound Bed Granulation Amount: Large (67-100%) Exposed Structure Granulation Quality: Red Fascia Exposed: No Necrotic Amount: Small (1-33%) Fat Layer (Subcutaneous Tissue) Exposed: Yes Necrotic Quality: Adherent Slough Tendon Exposed: No DENILSON, SALMINEN ([id number]) Muscle Exposed: No Joint Exposed: No Bone Exposed: No Periwound Skin Texture Texture Color No Abnormalities Noted: No No Abnormalities Noted: No Callus: No Atrophie Blanche: No Crepitus: No Cyanosis: No Excoriation: No Ecchymosis: No Induration: No Erythema: No Rash: No Hemosiderin Staining: Yes Scarring: No Mottled: No Pallor: No Moisture Rubor: No No Abnormalities Noted: No Dry / Scaly: No Temperature / Pain Maceration: No Temperature: No Abnormality Wound Preparation Ulcer Cleansing: Rinsed/Irrigated with Saline Topical Anesthetic Applied: Other: lidocaine 4%, Treatment Notes Wound #12 (Left, Posterior Lower Leg) 1. Cleansed with: Clean wound with Normal Saline 2. Anesthetic Topical Lidocaine 4% cream to wound bed prior to debridement 3. Peri-wound Care: Moisturizing lotion 4. Dressing Applied: Hydrafera Blue 5. Secondary Dressing Applied ABD Pad 7. Secured with 3 Layer Compression System - Bilateral Electronic Signature(s) Signed: 10/25/2017 5:02:07 PM By: Gretta Cool, BSN, RN, CWS, Kim RN,  BSN Entered By: Gretta Cool, BSN, RN, CWS, Kim on 10/25/2017 12:51:14 Adam Merritt ([id number]) -------------------------------------------------------------------------------- Wound Assessment Details Patient Name: Adam Merritt Date of Service: 10/25/2017 12:30 PM Medical Record Number: [id number] Patient Account Number: [id number] Date of Birth/Sex: 11/18/1945 (72 y.o. 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 Wounding Event: Blister Etiology: Date Acquired: 10/01/2017 Wound Status: Open Weeks Of Treatment: 1 Clustered Wound: No Photos Photo Uploaded By: Gretta Cool, BSN, RN, CWS, Kim on 10/25/2017 17:14:44 Wound Measurements Length: (cm) 0 Width: (cm) 0 Depth: (cm) 0 Area: (cm) 0 Volume: (cm) 0 % Reduction in Area: 100% % Reduction in Volume: 100% Wound Description Classification: Grade 1 Periwound Skin Texture Texture Color No Abnormalities Noted: No No Abnormalities Noted: No Moisture No Abnormalities Noted: No Electronic Signature(s) Signed: 10/25/2017 5:02:07 PM By: Gretta Cool, BSN, RN, CWS, Kim RN, BSN Entered By: Gretta Cool, BSN, RN, CWS, Kim on 10/25/2017 12:48:27 Adam Merritt ([id number]) -------------------------------------------------------------------------------- [facility] Details Patient Name: Adam Merritt Date of Service: 10/25/2017 12:30 PM Medical Record Number: [id number] Patient Account Number: [id number] Date of Birth/Sex: August 12, 1945 (72 y.o. M) Treating RN: Cornell Barman Primary Care Bronc Brosseau: [name] Other Clinician: Referring Janda Cargo: [name] Treating Renita Brocks/Extender: Cathie Olden in Treatment: 1 Vital Signs Time Taken: 12:42 Temperature (F):  97.9 Height (in): 66 Pulse (bpm): 76 Weight (lbs): 298 Respiratory Rate (breaths/min): 18 Body Mass Index (BMI): 48.1 Blood Pressure (mmHg): 144/66 Reference Range: 80 - 120  mg / dl Electronic Signature(s) Signed: 10/25/2017 5:02:07 PM By: Gretta Cool, BSN, RN, CWS, Kim RN, BSN Entered By: Gretta Cool, BSN, RN, CWS, Kim on 10/25/2017 12:44:11

## 2017-10-27 NOTE — Progress Notes (Signed)
Adam Merritt, Adam Merritt ([id number]) Visit Report for 10/25/2017 Chief Complaint Document Details Patient Name: Adam Merritt, Adam Merritt Date of Service: 10/25/2017 12:30 PM Medical Record Number: [id number] Patient Account Number: [id number] Date of Birth/Sex: 22-Aug-1945 (72 y.o. M) Treating RN: Roger Shelter Primary Care Provider: [name] Other Clinician: Referring Provider: [name] Treating Provider/Extender: Cathie Olden in Treatment: 1 Information Obtained from: Patient Chief Complaint he is here for evaluation of bilateral lower extremity lymphedema/venous ulcers Electronic Signature(s) Signed: 10/25/2017 1:02:29 PM By: Lawanda Cousins Entered By: Lawanda Cousins on 10/25/2017 13:02:29 Adam Merritt ([id number]) -------------------------------------------------------------------------------- HPI Details Patient Name: Adam Merritt Date of Service: 10/25/2017 12:30 PM Medical Record Number: [id number] Patient Account Number: [id number] Date of Birth/Sex: January 04, 1946 (72 y.o. M) Treating RN: Roger Shelter Primary Care Provider: [name] Other Clinician: Referring Provider: [name] Treating Provider/Extender: Cathie Olden in Treatment: 1 History of Present Illness Location: bilateral lower extremity Quality: no pain to ulcerations Duration: ulcerations have been present 3-4 weeks Context: ulcerations of secondary to edema, blister formation HPI Description: 10/18/17-He is here for initial evaluation of bilateral lower extremity ulcerations in the presence of venous insufficiency and lymphedema. He has been seen by vascular medicine in the past, Dr. Lucky Cowboy, last seen in 2016. He does have a history of abnormal ABIs, which is to be expected given his lymphedema and venous insufficiency. According to Epic, it appears that all attempts for arterial evaluation and/or angiography were not follow through with by patient. He does have a history of being seen in lymphedema clinic  in 2018, stopped going approximately 6 months ago stating "it didn't do any good". He does not have lymphedema pumps, he does not have custom fit compression wrap/stockings. He is diabetic and his recent A1c last month was 7.6. He admits to chronic bilateral lower extremity pain, no change in pain since blister and ulceration development. He is currently being treated with Levaquin for bronchitis. He has home health and we will continue. 10/25/17-He is here in follow-up evaluation for bilateral lower extremity ulcerationssubtle he remains on Levaquin for bronchitis. Right lower extremity with no evidence of drainage or ulceration, persistent left lower extremity ulceration. He states that home health has not been out since his appointment. He went to [facility] on Tuesday, studies revealed: RIGHT ABI 0.9, TBI 0.6 LEFT ABI 1.1, TBI 0.6 with triphasic flow bilaterally. We will continue his same treatment plan. He has been educated on compression therapy and need for elevation. He will benefit from lymphedema pumps Electronic Signature(s) Signed: 10/25/2017 1:22:20 PM By: Lawanda Cousins Previous Signature: 10/25/2017 1:13:37 PM Version By: Lawanda Cousins Entered By: Lawanda Cousins on 10/25/2017 13:22:20 Adam Merritt ([id number]) -------------------------------------------------------------------------------- Physician Orders Details Patient Name: Adam Merritt Date of Service: 10/25/2017 12:30 PM Medical Record Number: [id number] Patient Account Number: [id number] Date of Birth/Sex: 1945/09/13 (72 y.o. M) Treating RN: Roger Shelter Primary Care Provider: [name] Other Clinician: Referring Provider: [name] Treating Provider/Extender: Cathie Olden in Treatment: 1 Verbal / Phone Orders: No Diagnosis Coding Wound Cleansing Wound #10 Left,Anterior Lower Leg o Clean wound with Normal Saline. o Cleanse wound with mild soap and water Wound #11 Left,Medial Lower  Leg o Clean wound with Normal Saline. o Cleanse wound with mild soap and water Wound #12 Left,Posterior Lower Leg o Clean wound with Normal Saline. o Cleanse wound with mild soap and water Anesthetic (add to Medication List) Wound #10 Left,Anterior Lower Leg o Topical Lidocaine 4% cream applied to wound bed  prior to debridement (In Clinic Only). Wound #11 Left,Medial Lower Leg o Topical Lidocaine 4% cream applied to wound bed prior to debridement (In Clinic Only). Wound #12 Left,Posterior Lower Leg o Topical Lidocaine 4% cream applied to wound bed prior to debridement (In Clinic Only). Skin Barriers/Peri-Wound Care o Moisturizing lotion - not on wounds Primary Wound Dressing Wound #10 Left,Anterior Lower Leg o Hydrafera Blue Ready Transfer - upon removing from wound moisten with warm soapy water Wound #11 Left,Medial Lower Leg o Hydrafera Blue Ready Transfer - upon removing from wound moisten with warm soapy water Wound #12 Left,Posterior Lower Leg o Hydrafera Blue Ready Transfer - upon removing from wound moisten with warm soapy water Secondary Dressing Wound #10 Left,Anterior Lower Leg o ABD pad Wound #11 Left,Medial Lower Leg o ABD pad Wound #12 Left,Posterior Lower Leg Adam Merritt, Adam Merritt ([id number]) o ABD pad Dressing Change Frequency Wound #11 Left,Medial Lower Leg o Dressing is to be changed Monday and Thursday. - patient comes to clinic for wound care on Thursday and [facility] needs to change dressing on Mondays Wound #10 Left,Anterior Lower Leg o Dressing is to be changed Monday and Thursday. - patient comes to clinic for wound care on Thursday and [facility] needs to change dressing on Mondays Wound #12 Left,Posterior Lower Leg o Dressing is to be changed Monday and Thursday. - patient comes to clinic for wound care on Thursday and [facility] needs to change dressing on Mondays Follow-up Appointments Wound #10 Left,Anterior Lower Leg o Return  Appointment in 1 week. Wound #11 Left,Medial Lower Leg o Return Appointment in 1 week. Wound #12 Left,Posterior Lower Leg o Return Appointment in 1 week. Edema Control Wound #10 Left,Anterior Lower Leg o 3 Layer Compression System - Bilateral - unna to anchor o Other: - Clinic to order compression pumps for home use Wound #11 Left,Medial Lower Leg o 3 Layer Compression System - Bilateral - unna to anchor Wound #12 Left,Posterior Lower Leg o 3 Layer Compression System - Bilateral - unna to anchor Additional Orders / Instructions Wound #10 Left,Anterior Lower Leg o Increase protein intake. Wound #11 Left,Medial Lower Leg o Increase protein intake. Wound #12 Left,Posterior Lower Leg o Increase protein intake. Home Health Wound #10 [place] Visits - Please go out to see pt tomorrow (Friday) 10/19/17 to change wraps Wound #11 Left,Medial Lower Leg o [place] Visits - Please go out to see pt tomorrow (Friday) 10/19/17 to change wraps Wound #12 [place] Visits - Please go out to see pt tomorrow (Friday) 10/19/17 to change wraps Adam Merritt, Adam Merritt ([id number]) Electronic Signature(s) Signed: 10/25/2017 4:33:19 PM By: Roger Shelter Signed: 10/26/2017 8:17:02 AM By: Lawanda Cousins Entered By: Roger Shelter on 10/25/2017 12:59:52 Adam Merritt ([id number]) -------------------------------------------------------------------------------- Problem List Details Patient Name: Adam Merritt Date of Service: 10/25/2017 12:30 PM Medical Record Number: [id number] Patient Account Number: [id number] Date of Birth/Sex: [birth year] (72 y.o. 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 10/18/2017 Yes I89.0 Lymphedema, not elsewhere classified 10/18/2017 Yes E66.9 Obesity, unspecified 10/18/2017 Yes Inactive Problems Resolved Problems Electronic Signature(s) Signed: 10/25/2017 1:02:04 PM By: Lawanda Cousins Entered By: Lawanda Cousins  on 10/25/2017 13:02:04 Adam Merritt, Adam Merritt ([id number]) -------------------------------------------------------------------------------- Progress Note Details Patient Name: Adam Merritt, Adam Merritt Date of Service: 10/25/2017 12:30 PM Medical Record Number: [id number] Patient Account Number: [id number] Date of Birth/Sex: 1945-06-14 (72 y.o. M) Treating RN: Roger Shelter Primary Care Provider: [name] Other Clinician: Referring Provider: [name] Treating Provider/Extender: Cathie Olden in Treatment: 1 Subjective Chief Complaint Information obtained from Patient he is here for evaluation of bilateral lower extremity lymphedema/venous ulcers History of Present Illness (HPI) The following HPI elements were documented for the patient's wound: Location: bilateral lower extremity Quality: no pain to ulcerations Duration: ulcerations have been present 3-4 weeks Context: ulcerations of secondary to edema, blister formation 10/18/17-He is here for initial evaluation of bilateral lower extremity ulcerations in the presence of venous insufficiency and lymphedema. He has been seen by vascular medicine in the past, Dr. Lucky Cowboy, last seen in 2016. He does have a history of abnormal ABIs, which is to be expected given his lymphedema and venous insufficiency. According to Epic, it appears that all attempts for arterial evaluation and/or angiography were not follow through with by patient. He does have a history of being seen in lymphedema clinic in 2018, stopped going  approximately 6 months ago stating "it didn't do any good". He does not have lymphedema pumps, he does not have custom fit compression wrap/stockings. He is diabetic and his recent A1c last month was 7.6. He admits to chronic bilateral lower extremity pain, no change in pain since blister and ulceration development. He is currently being treated with Levaquin for bronchitis. He has home health and we will continue. 10/25/17-He is here in follow-up evaluation for bilateral lower extremity ulcerationssubtle he remains on Levaquin for bronchitis. Right lower extremity with no evidence of drainage or ulceration, persistent left lower extremity ulceration. He states that home health has not been out since his appointment. He went to [facility] on Tuesday, studies revealed: RIGHT ABI 0.9, TBI 0.6 LEFT ABI 1.1, TBI 0.6 with triphasic flow bilaterally. We will continue his same treatment plan. He has been educated on compression therapy and need for elevation. He will benefit from lymphedema pumps Patient History Information obtained from Patient. Family History Cancer - Paternal Grandparents, Kidney Disease - Siblings, Lung Disease - Mother, No family history of Diabetes, Heart Disease, Hereditary Spherocytosis, Hypertension, Seizures, Stroke, Thyroid Problems, Tuberculosis. Social History Never smoker, Marital Status - Married, Alcohol Use - Never, Drug Use - No History, Caffeine Use - Never. Medical History Hospitalization/Surgery History - 11/03/2013, [facility], bronchitis. Medical And Surgical History Notes Respiratory home O2 at night as needed Cardiovascular varicose veins Adam Merritt, Adam Merritt ([id number]) Neurologic CVA - 1992 Objective Constitutional Vitals Time Taken: 12:42 PM, Height: 66 in, Weight: 298 lbs, BMI: 48.1, Temperature: 97.9 F, Pulse: 76 bpm, Respiratory Rate: 18 breaths/min, Blood Pressure: 144/66 mmHg. Integumentary (Hair, Skin) Wound #10 status is Open. Original  cause of wound was Blister. The wound is located on the Left,Anterior Lower Leg. The wound measures 0.1cm length x 0.1cm width x 0.1cm depth; 0.008cm^2 area and 0.001cm^3 volume. There is Fat Layer (Subcutaneous Tissue) Exposed exposed. There is no tunneling or undermining noted. There is a medium amount of serous drainage noted. The wound margin is flat and intact. There is medium (34-66%) red granulation within the wound bed. There is a medium (34-66%) amount of necrotic tissue within the wound bed including Adherent Slough. The periwound skin appearance exhibited: Hemosiderin Staining. The periwound skin appearance did not exhibit: Callus, Crepitus, Excoriation, Induration,  Rash, Scarring, Dry/Scaly, Maceration, Atrophie Blanche, Cyanosis, Ecchymosis, Mottled, Pallor, Rubor, Erythema. Periwound temperature was noted as No Abnormality. The periwound has tenderness on palpation. Wound #11 status is Open. Original cause of wound was Blister. The wound is located on the Left,Medial Lower Leg. The wound measures 1cm length x 2cm width x 0.1cm depth; 1.571cm^2 area and 0.157cm^3 volume. There is Fat Layer (Subcutaneous Tissue) Exposed exposed. There is no tunneling or undermining noted. There is a large amount of serous drainage noted. The wound margin is flat and intact. There is medium (34-66%) red granulation within the wound bed. There is a medium (34-66%) amount of necrotic tissue within the wound bed including Adherent Slough. The periwound skin appearance exhibited: Hemosiderin Staining. The periwound skin appearance did not exhibit: Callus, Crepitus, Excoriation, Induration, Rash, Scarring, Dry/Scaly, Maceration, Atrophie Blanche, Cyanosis, Ecchymosis, Mottled, Pallor, Rubor, Erythema. Periwound temperature was noted as No Abnormality. The periwound has tenderness on palpation. Wound #12 status is Open. Original cause of wound was Blister. The wound is located on the Left,Posterior Lower Leg.  The wound measures 2.2cm length x 1.8cm width x 0.1cm depth; 3.11cm^2 area and 0.311cm^3 volume. There is Fat Layer (Subcutaneous Tissue) Exposed exposed. There is no tunneling or undermining noted. There is a large amount of serous drainage noted. The wound margin is flat and intact. There is large (67-100%) red granulation within the wound bed. There is a small (1-33%) amount of necrotic tissue within the wound bed including Adherent Slough. The periwound skin appearance exhibited: Hemosiderin Staining. The periwound skin appearance did not exhibit: Callus, Crepitus, Excoriation, Induration, Rash, Scarring, Dry/Scaly, Maceration, Atrophie Blanche, Cyanosis, Ecchymosis, Mottled, Pallor, Rubor, Erythema. Periwound temperature was noted as No Abnormality. Wound #9 status is Open. Original cause of wound was Blister. The wound is located on the Right,Anterior Lower Leg. The wound measures 0cm length x 0cm width x 0cm depth; 0cm^2 area and 0cm^3 volume. 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 There was a Three Layer Compression Therapy Procedure by Roger Shelter, RN. Post procedure Diagnosis Wound #10: Same as Pre-Procedure Wound #11 Pre-procedure diagnosis of Wound #11 is a Diabetic Wound/Ulcer of the Lower Extremity located on the Left,Medial Lower Leg . There was a Three Layer Compression Therapy Procedure by Roger Shelter, RN. Post procedure Diagnosis Wound #11: Same as Pre-Procedure Wound #12 Pre-procedure diagnosis of Wound #12 is a Diabetic Wound/Ulcer of the Lower Extremity located on the  Left,Posterior Lower Leg . There was a Three Layer Compression Therapy Procedure by Roger Shelter, RN. Post procedure Diagnosis Wound #12: Same as Pre-Procedure Plan Wound Cleansing: Wound #10 Left,Anterior Lower Leg: Clean wound with Normal Saline. Cleanse wound with mild soap and water Wound #11 Left,Medial Lower Leg: Clean wound with Normal Saline. Cleanse wound with mild soap and water Wound #12 Left,Posterior Lower Leg: Clean wound with Normal Saline. Cleanse wound with mild soap and water Anesthetic (add to Medication List): Wound #10 Left,Anterior Lower Leg: Topical Lidocaine 4% cream applied to wound bed prior to debridement (In Clinic Only). Wound #11 Left,Medial Lower Leg: Topical Lidocaine 4% cream applied to wound bed prior to debridement (In Clinic Only). Wound #12 Left,Posterior Lower  Leg: Topical Lidocaine 4% cream applied to wound bed prior to debridement (In Clinic Only). Skin Barriers/Peri-Wound Care: Moisturizing lotion - not on wounds Primary Wound Dressing: Wound #10 Left,Anterior Lower Leg: ADLER, CHARTRAND ([id number]) Hydrafera Blue Ready Transfer - upon removing from wound moisten with warm soapy water Wound #11 Left,Medial Lower Leg: Hydrafera Blue Ready Transfer - upon removing from wound moisten with warm soapy water Wound #12 Left,Posterior Lower Leg: Hydrafera Blue Ready Transfer - upon removing from wound moisten with warm soapy water Secondary Dressing: Wound #10 Left,Anterior Lower Leg: ABD pad Wound #11 Left,Medial Lower Leg: ABD pad Wound #12 Left,Posterior Lower Leg: ABD pad Dressing Change Frequency: Wound #11 Left,Medial Lower Leg: Dressing is to be changed Monday and Thursday. - patient comes to clinic for wound care on Thursday and [facility] needs to change dressing on Mondays Wound #10 Left,Anterior Lower Leg: Dressing is to be changed Monday and Thursday. - patient comes to clinic for wound care on Thursday and [facility] needs to change  dressing on Mondays Wound #12 Left,Posterior Lower Leg: Dressing is to be changed Monday and Thursday. - patient comes to clinic for wound care on Thursday and [facility] needs to change dressing on Mondays Follow-up Appointments: Wound #10 Left,Anterior Lower Leg: Return Appointment in 1 week. Wound #11 Left,Medial Lower Leg: Return Appointment in 1 week. Wound #12 Left,Posterior Lower Leg: Return Appointment in 1 week. Edema Control: Wound #10 Left,Anterior Lower Leg: 3 Layer Compression System - Bilateral - unna to anchor Other: - Clinic to order compression pumps for home use Wound #11 Left,Medial Lower Leg: 3 Layer Compression System - Bilateral - unna to anchor Wound #12 Left,Posterior Lower Leg: 3 Layer Compression System - Bilateral - unna to anchor Additional Orders / Instructions: Wound #10 Left,Anterior Lower Leg: Increase protein intake. Wound #11 Left,Medial Lower Leg: Increase protein intake. Wound #12 Left,Posterior Lower Leg: Increase protein intake. Home Health: Wound #10 Left,Anterior Lower Leg: [place] Visits - Please go out to see pt tomorrow (Friday) 10/19/17 to change wraps Wound #11 Left,Medial Lower Leg: [place] Visits - Please go out to see pt tomorrow (Friday) 10/19/17 to change wraps Wound #12 Left,Posterior Lower Leg: [place] Visits - Please go out to see pt tomorrow (Friday) 10/19/17 to change wraps Electronic Signature(s) Signed: 10/25/2017 1:22:37 PM By: Lawanda Cousins Previous Signature: 10/25/2017 1:14:00 PM Version By: Tami Lin ([id number]) Entered By: Lawanda Cousins on 10/25/2017 13:22:37 Adam Merritt ([id number]) -------------------------------------------------------------------------------- ROS/PFSH Details Patient Name: Adam Merritt Date of Service: 10/25/2017 12:30 PM Medical Record Number: [id number] Patient Account Number: [id number] Date of Birth/Sex: [birth year] (72 y.o.  M) Treating RN: Roger Shelter Primary Care Provider: [name] Other Clinician: Referring Provider: [name] Treating Provider/Extender: Cathie Olden in Treatment: 1 Information Obtained From Patient Wound History Do you currently have one or more open woundso Yes How many open wounds do you currently haveo 3 Approximately how long have you had your woundso 4 weeks How have you been treating your wound(s) until nowo kerlix and ace wraps Has your wound(s) ever healed and then re-openedo No Have you had any lab work done in the past montho No Have you tested positive for an antibiotic resistant organism (MRSA, VRE)o No Have you tested positive for osteomyelitis (bone infection)o No Have you had any tests for circulation on your legso Yes Who ordered the testo [facility] Where was the test doneo [facility] Have you had other problems associated with your  woundso Swelling Eyes Medical History: Negative for: Cataracts; Glaucoma; Optic Neuritis Ear/Nose/Mouth/Throat Medical History: Negative for: Chronic sinus problems/congestion; Middle ear problems Hematologic/Lymphatic Medical History: Positive for: Anemia - aplastic anemia; Lymphedema Negative for: Hemophilia; Human Immunodeficiency Virus; Sickle Cell Disease Respiratory Medical History: Positive for: Chronic Obstructive Pulmonary Disease (COPD); Sleep Apnea Negative for: Aspiration; Asthma; Pneumothorax; Tuberculosis Past Medical History Notes: home O2 at night as needed Cardiovascular Medical History: Positive for: Congestive Heart Failure; Coronary Artery Disease; Hypertension; Peripheral Venous Disease Negative for: Angina; Arrhythmia; Deep Vein Thrombosis; Hypotension; Myocardial Infarction; Peripheral Arterial Disease; Phlebitis; Vasculitis Past Medical History Notes: varicose veins Adam Merritt, Adam Merritt ([id number]) Gastrointestinal Medical History: Negative for: Cirrhosis ; Colitis; Crohnos; Hepatitis A; Hepatitis  B; Hepatitis C Endocrine Medical History: Positive for: Type II Diabetes Negative for: Type I Diabetes Time with diabetes: since 2006 Treated with: Insulin Blood sugar tested every day: Yes Tested : Blood sugar testing results: Breakfast: 209 Genitourinary Medical History: Negative for: End Stage Renal Disease Immunological Medical History: Negative for: Lupus Erythematosus; Raynaudos; Scleroderma Integumentary (Skin) Medical History: Negative for: History of Burn; History of pressure wounds Musculoskeletal Medical History: Positive for: Osteoarthritis Negative for: Gout; Rheumatoid Arthritis; Osteomyelitis Neurologic Medical History: Positive for: Neuropathy Negative for: Dementia; Quadriplegia; Paraplegia; Seizure Disorder Past Medical History Notes: CVA - 1992 Oncologic Medical History: Positive for: Received Chemotherapy - last dose 2006 Negative for: Received Radiation Psychiatric Medical History: Negative for: Anorexia/bulimia; Confinement Anxiety Immunizations Pneumococcal Vaccine: GIOVONNIE, Adam Merritt ([id number]) Received Pneumococcal Vaccination: Yes Implantable Devices Hospitalization / Surgery History Name of Hospital Purpose of Hospitalization/Surgery Date [facility] bronchitis 11/03/2013 Family and Social History Cancer: Yes - Paternal Grandparents; Diabetes: No; Heart Disease: No; Hereditary Spherocytosis: No; Hypertension: No; Kidney Disease: Yes - Siblings; Lung Disease: Yes - Mother; Seizures: No; Stroke: No; Thyroid Problems: No; Tuberculosis: No; Never smoker; Marital Status - Married; Alcohol Use: Never; Drug Use: No History; Caffeine Use: Never; Financial Concerns: No; Food, Clothing or Shelter Needs: No; Support System Lacking: No; Transportation Concerns: No; Advanced Directives: No; Patient does not want information on Advanced Directives Physician Affirmation I have reviewed and agree with the above information. Electronic Signature(s) Signed:  10/25/2017 4:33:19 PM By: Roger Shelter Signed: 10/26/2017 8:17:02 AM By: Lawanda Cousins Entered By: Lawanda Cousins on 10/25/2017 13:13:46 Adam Merritt ([id number]) -------------------------------------------------------------------------------- [place] Details Patient Name: Adam Merritt Date of Service: 10/25/2017 Medical Record Number: [id number] Patient Account Number: [id number] Date of Birth/Sex: 12/08/1945 (72 y.o. 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

## 2017-11-01 ENCOUNTER — Encounter: Payer: Medicare Other | Admitting: Nurse Practitioner

## 2017-11-01 DIAGNOSIS — E11622 Type 2 diabetes mellitus with other skin ulcer: Secondary | ICD-10-CM | POA: Diagnosis not present

## 2017-11-02 ENCOUNTER — Other Ambulatory Visit: Payer: Self-pay | Admitting: Nurse Practitioner

## 2017-11-02 ENCOUNTER — Telehealth: Payer: Self-pay

## 2017-11-02 DIAGNOSIS — F5101 Primary insomnia: Secondary | ICD-10-CM

## 2017-11-02 MED ORDER — ZOLPIDEM TARTRATE ER 12.5 MG PO TBCR: 12.5000 mg | EXTENDED_RELEASE_TABLET | Freq: Every evening | ORAL | 3 refills | Status: DC | PRN

## 2017-11-02 NOTE — Telephone Encounter (Signed)
Refilled zolpidem ER 12.5mg  tablets per pharmacy request.

## 2017-11-02 NOTE — Progress Notes (Signed)
Refilled zolpidem ER 12.5mg  tablets per pharmacy request.

## 2017-11-04 NOTE — Progress Notes (Signed)
PRAKASH, KIMBERLING ([id number]) Visit Report for 11/01/2017 Arrival Information Details Patient Name: Adam Merritt, Adam Merritt Date of Service: 11/01/2017 2:00 PM Medical Record Number: [id number] Patient Account Number: [id number] Date of Birth/Sex: 06/21/1945 (72 y.o. M) Treating RN: Montey Hora Primary Care Debhora Titus: [name] Other Clinician: Referring Lindsi Bayliss: [name] Treating Peggy Monk/Extender: Cathie Olden in Treatment: 2 Visit Information History Since Last Visit Added or deleted any medications: No Patient Arrived: Cane Any new allergies or adverse reactions: No Arrival Time: 14:06 Had a fall or experienced change in No Accompanied By: spouse activities of daily living that may affect Transfer Assistance: None risk of falls: Patient Identification Verified: Yes Signs or symptoms of abuse/neglect since last visito No Secondary Verification Process Completed: Yes Hospitalized since last visit: No Patient Has Alerts: Yes Implantable device outside of the clinic excluding No Patient Alerts: DMII cellular tissue based products placed in the center since last visit: Has Dressing in Place as Prescribed: Yes Has Compression in Place as Prescribed: Yes Pain Present Now: No Electronic Signature(s) Signed: 11/01/2017 4:32:43 PM By: Montey Hora Entered By: Montey Hora on 11/01/2017 14:07:10 Adam Merritt ([id number]) -------------------------------------------------------------------------------- Encounter Discharge Information Details Patient Name: Adam Merritt Date of Service: 11/01/2017 2:00 PM Medical Record Number: [id number] Patient Account Number: [id number] Date of Birth/Sex: 1945-06-07 (72 y.o. M) Treating RN: Roger Shelter Primary Care Donavyn Fecher: [name] Other Clinician: Referring Judine Arciniega: [name] Treating Jaquil Todt/Extender: Cathie Olden in Treatment: 2 Encounter Discharge Information Items Discharge Condition: Stable Ambulatory  Status: Cane Discharge Destination: Home Transportation: Private Auto Accompanied By: wife Schedule Follow-up Appointment: Yes Clinical Summary of Care: Electronic Signature(s) Signed: 11/01/2017 4:00:46 PM By: Roger Shelter Entered By: Roger Shelter on 11/01/2017 14:54:52 Adam Merritt ([id number]) -------------------------------------------------------------------------------- Lower Extremity Assessment Details Patient Name: Adam Merritt Date of Service: 11/01/2017 2:00 PM Medical Record Number: [id number] Patient Account Number: [id number] Date of Birth/Sex: [birth year] (72 y.o. M) Treating RN: Montey Hora Primary Care Zaelyn Noack: [name] Other Clinician: Referring Valeriano Bain: [name] Treating Marrah Vanevery/Extender: Cathie Olden in Treatment: 2 Edema Assessment Assessed: [Left: No] [Right: No] Edema: [Left: Yes] [Right: Yes] Calf Left: Right: Point of Measurement: 32 cm From Medial Instep 62.4 cm 55.6 cm Ankle Left: Right: Point of Measurement: 11 cm From Medial Instep 33.2 cm 28.6 cm Vascular Assessment Pulses: Dorsalis Pedis Palpable: [Left:Yes] [Right:Yes] Posterior Tibial Extremity colors, hair growth, and conditions: Extremity Color: [Left:Hyperpigmented] [Right:Hyperpigmented] Hair Growth on Extremity: [Left:No] [Right:No] Temperature of Extremity: [Left:Warm] [Right:Warm] Capillary Refill: [Left:< 3 seconds] [Right:< 3 seconds] Toe Nail Assessment Left: Right: Thick: Yes Yes Discolored: No No Deformed: No No Improper Length and Hygiene: No No Electronic Signature(s) Signed: 11/01/2017 4:32:43 PM By: Montey Hora Entered By: Montey Hora on 11/01/2017 14:19:44 Adam Merritt ([id number]) -------------------------------------------------------------------------------- Multi Wound Chart Details Patient Name: Adam Merritt Date of Service: 11/01/2017 2:00 PM Medical Record Number: [id number] Patient Account Number: [id number] Date of  Birth/Sex: 1945/07/21 (72 y.o. M) Treating RN: Ahmed Prima Primary Care Giavanni Odonovan: [name] Other Clinician: Referring Tarick Parenteau: [name] Treating Masayuki Sakai/Extender: Cathie Olden in Treatment: 2 Vital Signs Height(in): 66 Pulse(bpm): 90 Weight(lbs): 298 Blood Pressure(mmHg): 136/84 Body Mass Index(BMI): 48 Temperature(F): 98.2 Respiratory Rate 22 (breaths/min): Photos: [10:No Photos] [11:No Photos] [12:No Photos] Wound Location: [10:Left, Anterior Lower Leg] [11:Left Lower Leg - Medial] [12:Left Lower Leg - Posterior] Wounding Event: [10:Blister] [11:Blister] [12:Blister] Primary Etiology: [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] [11:Diabetic Wound/Ulcer of the Lower Extremity] [12:Diabetic Wound/Ulcer of the Lower Extremity] Secondary Etiology: [10:Venous  Leg Ulcer] [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] [12:Venous Leg Ulcer] Comorbid History: [10:N/A] [11:Anemia, Lymphedema, Chronic Obstructive Pulmonary Disease (COPD), Sleep Apnea, Congestive Heart Failure, Coronary Artery Disease, Hypertension, Peripheral Venous Disease, Type II Diabetes, Osteoarthritis, Neuropathy,  Received Chemotherapy] [12:Anemia, Lymphedema, Chronic Obstructive Pulmonary Disease (COPD), Sleep Apnea, Congestive Heart Failure, Coronary Artery Disease, Hypertension, Peripheral Venous Disease, Type II Diabetes, Osteoarthritis, Neuropathy, Received  Chemotherapy] Date Acquired: [10:10/01/2017] [11:10/01/2017] [12:10/01/2017] Weeks of Treatment: [10:2] [11:2] [12:2] Wound Status: [10:Healed - Epithelialized] [11:Open] [12:Open] Measurements L x W x D [10:0x0x0] [11:1.4x1.1x0.1] [12:0.1x0.1x0.1] (cm) Area (cm) : [10:0] [11:1.21] [12:0.008] Volume (cm) : [10:0] [11:0.121] [12:0.001] % Reduction in Area: [10:100.00%] [11:77.60%] [12:99.80%] % Reduction in Volume: [10:100.00%] [11:77.60%] [12:99.70%] Classification: [10:Grade 1] [11:Grade 1] [12:Grade 1] Exudate Amount: [10:N/A] [11:Large]  [12:Large] Exudate Type: [10:N/A] [11:Serous] [12:Serous] Exudate Color: [10:N/A] [11:amber] [12:amber] Wound Margin: [10:N/A] [11:Flat and Intact] [12:Flat and Intact] Granulation Amount: [10:N/A] [11:Large (67-100%)] [12:Large (67-100%)] Granulation Quality: [10:N/A] [11:Red] [12:Red] Necrotic Amount: [10:N/A] [11:Small (1-33%)] [12:Small (1-33%)] Epithelialization: [10:N/A] [11:Small (1-33%)] [12:None] Periwound Skin Texture: [10:No Abnormalities Noted] [11:Excoriation: No Induration: No Callus: No] [12:Excoriation: No Induration: No Callus: No] Crepitus: No Crepitus: No Rash: No Rash: No Scarring: No Scarring: No Periwound Skin Moisture: No Abnormalities Noted Maceration: No Maceration: No Dry/Scaly: No Dry/Scaly: No Periwound Skin Color: No Abnormalities Noted Hemosiderin Staining: Yes Hemosiderin Staining: Yes Atrophie Blanche: No Atrophie Blanche: No Cyanosis: No Cyanosis: No Ecchymosis: No Ecchymosis: No Erythema: No Erythema: No Mottled: No Mottled: No Pallor: No Pallor: No Rubor: No Rubor: No Temperature: N/A No Abnormality No Abnormality Tenderness on Palpation: No Yes No Wound Preparation: N/A Ulcer Cleansing: Ulcer Cleansing: Rinsed/Irrigated with Saline Rinsed/Irrigated with Saline Topical Anesthetic Applied: Topical Anesthetic Applied: Other: lidocaine 4% Other: lidocaine 4% Treatment Notes Electronic Signature(s) Signed: 11/01/2017 2:34:48 PM By: Lawanda Cousins Entered By: Lawanda Cousins on 11/01/2017 14:34:48 Adam Merritt ([id number]) -------------------------------------------------------------------------------- [place] Details Patient Name: Adam Merritt Date of Service: 11/01/2017 2:00 PM Medical Record Number: [id number] Patient Account Number: [id number] Date of Birth/Sex: 08-13-1945 (72 y.o. M) Treating RN: Ahmed Prima Primary Care Derral Colucci: [name] Other Clinician: Referring Charlottie Peragine: [name] Treating  Catalyna Reilly/Extender: Cathie Olden in Treatment: 2 Active Inactive ` Abuse / Safety / Falls / Self Care Management Nursing Diagnoses: Potential for falls Goals: Patient will not experience any injury related to falls Date Initiated: 10/18/2017 Target Resolution Date: 02/09/2018 Goal Status: Active Interventions: Assess Activities of Daily Living upon admission and as needed Assess fall risk on admission and as needed Assess: immobility, friction, shearing, incontinence upon admission and as needed Assess impairment of mobility on admission and as needed per policy Assess personal safety and home safety (as indicated) on admission and as needed Assess self care needs on admission and as needed Notes: ` Orientation to the Wound Care Program Nursing Diagnoses: Knowledge deficit related to the wound healing center program Goals: Patient/caregiver will verbalize understanding of the [place] Program Date Initiated: 10/18/2017 Target Resolution Date: 11/10/2017 Goal Status: Active Interventions: Provide education on orientation to the wound center Notes: ` Wound/Skin Impairment Nursing Diagnoses: Impaired tissue integrity Knowledge deficit related to ulceration/compromised skin integrity Adam Merritt, Adam Merritt ([id number]) Goals: Ulcer/skin breakdown will have a volume reduction of 80% by week 12 Date Initiated: 10/18/2017 Target Resolution Date: 01/05/2018 Goal Status: Active Interventions: Assess patient/caregiver ability to perform ulcer/skin care regimen upon admission and as needed Assess ulceration(s) every visit Notes: Electronic Signature(s) Signed: 11/02/2017 5:38:20 PM By: Alric Quan Entered By: Alric Quan on 11/01/2017  14:25:21 Adam Merritt, Adam Merritt ([id number]) -------------------------------------------------------------------------------- Pain Assessment Details Patient Name: Adam Merritt, Adam Merritt Date of Service: 11/01/2017 2:00 PM Medical Record Number:  [id number] Patient Account Number: [id number] Date of Birth/Sex: 05-03-1946 (72 y.o. M) Treating RN: Montey Hora Primary Care Daoud Lobue: [name] Other Clinician: Referring Zyrell Carmean: [name] Treating Twanisha Foulk/Extender: Cathie Olden in Treatment: 2 Active Problems Location of Pain Severity and Description of Pain Patient Has Paino Yes Site Locations Pain Location: Generalized Pain With Dressing Change: No Duration of the Pain. Constant / Intermittento Intermittent Pain Management and Medication Current Pain Management: Goals for Pain Management legs and feet Electronic Signature(s) Signed: 11/01/2017 4:32:43 PM By: Montey Hora Entered By: Montey Hora on 11/01/2017 14:07:59 Adam Merritt ([id number]) -------------------------------------------------------------------------------- Patient/Caregiver Education Details Patient Name: Adam Merritt Date of Service: 11/01/2017 2:00 PM Medical Record Number: [id number] Patient Account Number: [id number] Date of Birth/Gender: 1945-12-29 (72 y.o. M) Treating RN: Roger Shelter Primary Care Physician: [name] Other Clinician: Referring Physician: [name] Treating Physician/Extender: Cathie Olden in Treatment: 2 Education Assessment Education Provided To: Patient Education Topics Provided Wound Debridement: Handouts: Wound Debridement Methods: Explain/Verbal Responses: State content correctly Wound/Skin Impairment: Handouts: Caring for Your Ulcer Methods: Explain/Verbal Responses: State content correctly Electronic Signature(s) Signed: 11/01/2017 4:00:46 PM By: Roger Shelter Entered By: Roger Shelter on 11/01/2017 14:55:09 Adam Merritt ([id number]) -------------------------------------------------------------------------------- Wound Assessment Details Patient Name: Adam Merritt Date of Service: 11/01/2017 2:00 PM Medical Record Number: [id number] Patient Account Number:  [id number] Date of Birth/Sex: Oct 25, 1945 (72 y.o. M) Treating RN: Montey Hora Primary Care Kimberle Stanfill: [name] Other Clinician: Referring Romy Ipock: [name] Treating Alyzah Pelly/Extender: Lawanda Cousins [name]: 2 Wound Status Wound Number: 10 Primary Etiology: Diabetic Wound/Ulcer of the Lower Extremity Wound Location: Left, Anterior Lower Leg Secondary Venous Leg Ulcer Wounding Event: Blister Etiology: Date Acquired: 10/01/2017 Wound Status: Healed - Epithelialized Weeks Of Treatment: 2 Clustered Wound: No Photos Photo Uploaded By: Montey Hora on 11/01/2017 16:59:57 Wound Measurements Length: (cm) 0 % Width: (cm) 0 % Depth: (cm) 0 Area: (cm) 0 Volume: (cm) 0 Reduction in Area: 100% Reduction in Volume: 100% Wound Description Classification: Grade 1 Periwound Skin Texture Texture Color No Abnormalities Noted: No No Abnormalities Noted: No Moisture No Abnormalities Noted: No Electronic Signature(s) Signed: 11/01/2017 4:32:43 PM By: Montey Hora Entered By: Montey Hora on 11/01/2017 14:16:38 Adam Merritt ([id number]) -------------------------------------------------------------------------------- Wound Assessment Details Patient Name: Adam Merritt Date of Service: 11/01/2017 2:00 PM Medical Record Number: [id number] Patient Account Number: [id number] Date of Birth/Sex: [birth year] (72 y.o. M) Treating RN: Montey Hora Primary Care Tomisha Reppucci: [name] Other Clinician: Referring Aodhan Scheidt: [name] Treating Atarah Cadogan/Extender: Cathie Olden in Treatment: 2 Wound Status Wound Number: 11 Primary Diabetic Wound/Ulcer of the Lower Extremity Etiology: Wound Location: Left Lower Leg - Medial Secondary Venous Leg Ulcer Wounding Event: Blister Etiology: Date Acquired: 10/01/2017 Wound Open Weeks Of Treatment: 2 Status: Clustered Wound: No Comorbid Anemia, Lymphedema, Chronic Obstructive History: Pulmonary Disease (COPD), Sleep  Apnea, Congestive Heart Failure, Coronary Artery Disease, Hypertension, Peripheral Venous Disease, Type II Diabetes, Osteoarthritis, Neuropathy, Received Chemotherapy Photos Photo Uploaded By: Montey Hora on 11/01/2017 17:00:18 Wound Measurements Length: (cm) 1.4 Width: (cm) 1.1 Depth: (cm) 0.1 Area: (cm) 1.21 Volume: (cm) 0.121 % Reduction in Area: 77.6% % Reduction in Volume: 77.6% Epithelialization: Small (1-33%) Tunneling: No Undermining: No Wound Description Classification: Grade 1 Foul O Wound Margin: Flat and Intact Slough Exudate Amount: Large Exudate Type: Serous Exudate Color: amber dor After Cleansing: No Adam Merritt Yes  Wound Bed Granulation Amount: Large (67-100%) Exposed Structure Granulation Quality: Red Fascia Exposed: No Necrotic Amount: Small (1-33%) Fat Layer (Subcutaneous Tissue) Exposed: Yes Necrotic Quality: Adherent Slough Tendon Exposed: No Adam Merritt, Adam Merritt ([id number]) Muscle Exposed: No Joint Exposed: No Bone Exposed: No Periwound Skin Texture Texture Color No Abnormalities Noted: No No Abnormalities Noted: No Callus: No Atrophie Blanche: No Crepitus: No Cyanosis: No Excoriation: No Ecchymosis: No Induration: No Erythema: No Rash: No Hemosiderin Staining: Yes Scarring: No Mottled: No Pallor: No Moisture Rubor: No No Abnormalities Noted: No Dry / Scaly: No Temperature / Pain Maceration: No Temperature: No Abnormality Tenderness on Palpation: Yes Wound Preparation Ulcer Cleansing: Rinsed/Irrigated with Saline Topical Anesthetic Applied: Other: lidocaine 4%, Treatment Notes Wound #11 (Left, Medial Lower Leg) 1. Cleansed with: Clean wound with Normal Saline 2. Anesthetic Topical Lidocaine 4% cream to wound bed prior to debridement 3. Peri-wound Care: Moisturizing lotion 4. Dressing Applied: Hydrafera Blue 7. Secured with 3 Layer Compression System - Bilateral Notes unna boot to Marketing executive) Signed: 11/01/2017 4:32:43 PM By: Montey Hora Entered By: Montey Hora on 11/01/2017 14:17:16 Adam Merritt ([id number]) -------------------------------------------------------------------------------- Wound Assessment Details Patient Name: Adam Merritt Date of Service: 11/01/2017 2:00 PM Medical Record Number: [id number] Patient Account Number: [id number] Date of Birth/Sex: August 25, 1945 (72 y.o. M) Treating RN: Montey Hora Primary Care Tonie Elsey: [name] Other Clinician: Referring Sheniah Supak: [name] Treating Jacci Ruberg/Extender: Cathie Olden in Treatment: 2 Wound Status Wound Number: 12 Primary Diabetic Wound/Ulcer of the Lower Extremity Etiology: Wound Location: Left Lower Leg - Posterior Secondary Venous Leg Ulcer Wounding Event: Blister Etiology: Date Acquired: 10/01/2017 Wound Open Weeks Of Treatment: 2 Status: Clustered Wound: No Comorbid Anemia, Lymphedema, Chronic Obstructive History: Pulmonary Disease (COPD), Sleep Apnea, Congestive Heart Failure, Coronary Artery Disease, Hypertension, Peripheral Venous Disease, Type II Diabetes, Osteoarthritis, Neuropathy, Received Chemotherapy Photos Photo Uploaded By: Montey Hora on 11/01/2017 17:00:19 Wound Measurements Length: (cm) 0.1 Width: (cm) 0.1 Depth: (cm) 0.1 Area: (cm) 0.008 Volume: (cm) 0.001 % Reduction in Area: 99.8% % Reduction in Volume: 99.7% Epithelialization: None Tunneling: No Undermining: No Wound Description Classification: Grade 1 Wound Margin: Flat and Intact Exudate Amount: Large Exudate Type: Serous Exudate Color: amber Foul Odor After Cleansing: No Slough/Fibrino Yes Wound Bed Granulation Amount: Large (67-100%) Exposed Structure Granulation Quality: Red Fascia Exposed: No Necrotic Amount: Small (1-33%) Fat Layer (Subcutaneous Tissue) Exposed: Yes Necrotic Quality: Adherent Slough Tendon Exposed: No Adam Merritt, Adam Merritt ([id number]) Muscle Exposed:  No Joint Exposed: No Bone Exposed: No Periwound Skin Texture Texture Color No Abnormalities Noted: No No Abnormalities Noted: No Callus: No Atrophie Blanche: No Crepitus: No Cyanosis: No Excoriation: No Ecchymosis: No Induration: No Erythema: No Rash: No Hemosiderin Staining: Yes Scarring: No Mottled: No Pallor: No Moisture Rubor: No No Abnormalities Noted: No Dry / Scaly: No Temperature / Pain Maceration: No Temperature: No Abnormality Wound Preparation Ulcer Cleansing: Rinsed/Irrigated with Saline Topical Anesthetic Applied: Other: lidocaine 4%, Treatment Notes Wound #12 (Left, Posterior Lower Leg) 1. Cleansed with: Clean wound with Normal Saline 2. Anesthetic Topical Lidocaine 4% cream to wound bed prior to debridement 3. Peri-wound Care: Moisturizing lotion 4. Dressing Applied: Hydrafera Blue 7. Secured with 3 Layer Compression System - Bilateral Notes unna boot to Engineer, production) Signed: 11/01/2017 4:32:43 PM By: Montey Hora Entered By: Montey Hora on 11/01/2017 14:17:29 Adam Merritt ([id number]) -------------------------------------------------------------------------------- [place] Details Patient Name: Adam Merritt Date of Service: 11/01/2017 2:00 PM Medical Record Number: [id number] Patient Account Number: [id number] Date of Birth/Sex: 18-Oct-1945 (  72 y.o. M) Treating RN: Montey Hora Primary Care Gerica Koble: [name] Other Clinician: Referring Satomi Buda: [name] Treating Maebelle Sulton/Extender: Cathie Olden in Treatment: 2 Vital Signs Time Taken: 14:08 Temperature (F): 98.2 Height (in): 66 Pulse (bpm): 90 Weight (lbs): 298 Respiratory Rate (breaths/min): 22 Body Mass Index (BMI): 48.1 Blood Pressure (mmHg): 136/84 Reference Range: 80 - 120 mg / dl Electronic Signature(s) Signed: 11/01/2017 4:32:43 PM By: Montey Hora Entered By: Montey Hora on 11/01/2017 14:15:08

## 2017-11-04 NOTE — Progress Notes (Signed)
Adam, Merritt ([id number]) Visit Report for 11/01/2017 Chief Complaint Document Details Patient Name: Adam Merritt, Adam Merritt Date of Service: 11/01/2017 2:00 PM Medical Record Number: [id number] Patient Account Number: [id number] Date of Birth/Sex: [birth year] (72 y.o. M) Treating RN: Ahmed Prima Primary Care Provider: [name] Other Clinician: Referring Provider: [name] Treating Provider/Extender: Cathie Olden in Treatment: 2 Information Obtained from: Patient Chief Complaint he is here for evaluation of bilateral lower extremity lymphedema/venous ulcers Electronic Signature(s) Signed: 11/01/2017 2:35:09 PM By: Lawanda Cousins Entered By: Lawanda Cousins on 11/01/2017 14:35:08 Adam Merritt ([id number]) -------------------------------------------------------------------------------- HPI Details Patient Name: Adam Merritt Date of Service: 11/01/2017 2:00 PM Medical Record Number: [id number] Patient Account Number: [id number] Date of Birth/Sex: March 16, 1946 (72 y.o. M) Treating RN: Ahmed Prima Primary Care Provider: [name] Other Clinician: Referring Provider: [name] Treating Provider/Extender: Cathie Olden in Treatment: 2 History of Present Illness Location: bilateral lower extremity Quality: no pain to ulcerations Duration: ulcerations have been present 3-4 weeks Context: ulcerations of secondary to edema, blister formation HPI Description: 10/18/17-He is here for initial evaluation of bilateral lower extremity ulcerations in the presence of venous insufficiency and lymphedema. He has been seen by vascular medicine in the past, Dr. Lucky Cowboy, last seen in 2016. He does have a history of abnormal ABIs, which is to be expected given his lymphedema and venous insufficiency. According to Epic, it appears that all attempts for arterial evaluation and/or angiography were not follow through with by patient. He does have a history of being seen in lymphedema clinic in  2018, stopped going approximately 6 months ago stating "it didn't do any good". He does not have lymphedema pumps, he does not have custom fit compression wrap/stockings. He is diabetic and his recent A1c last month was 7.6. He admits to chronic bilateral lower extremity pain, no change in pain since blister and ulceration development. He is currently being treated with Levaquin for bronchitis. He has home health and we will continue. 10/25/17-He is here in follow-up evaluation for bilateral lower extremity ulcerationssubtle he remains on Levaquin for bronchitis. Right lower extremity with no evidence of drainage or ulceration, persistent left lower extremity ulceration. He states that home health has not been out since his appointment. He went to [facility] on Tuesday, studies revealed: RIGHT ABI 0.9, TBI 0.6 LEFT ABI 1.1, TBI 0.6 with triphasic flow bilaterally. We will continue his same treatment plan. He has been educated on compression therapy and need for elevation. He will benefit from lymphedema pumps 11/01/17-He is here in follow-up evaluation for left lower extremity ulcer. The right lower extremity remains healed. He has home health services, but they have not been out to see the patient for 2-3 weeks. He states it home health physical therapy changed his dressing yesterday after therapy; he placed Ace wrap compression. We are still waiting for lymphedema pumps, reordered d/t need for company change. Electronic Signature(s) Signed: 11/01/2017 2:47:46 PM By: Lawanda Cousins Entered By: Lawanda Cousins on 11/01/2017 14:47:46 Adam Merritt ([id number]) -------------------------------------------------------------------------------- Physician Orders Details Patient Name: Adam Merritt Date of Service: 11/01/2017 2:00 PM Medical Record Number: [id number] Patient Account Number: [id number] Date of Birth/Sex: 1945/09/18 (72 y.o. M) Treating RN: Ahmed Prima Primary Care  Provider: [name] Other Clinician: Referring Provider: [name] Treating Provider/Extender: Cathie Olden in Treatment: 2 Verbal / Phone Orders: Yes Clinician: Pinkerton, Debi Read Back and Verified: Yes Diagnosis Coding Wound Cleansing Wound #11 Left,Medial Lower Leg o Clean wound with Normal Saline.   o Cleanse wound with mild soap and water Wound #12 Left,Posterior Lower Leg o Clean wound with Normal Saline. o Cleanse wound with mild soap and water Anesthetic (add to Medication List) Wound #11 Left,Medial Lower Leg o Topical Lidocaine 4% cream applied to wound bed prior to debridement (In Clinic Only). Wound #12 Left,Posterior Lower Leg o Topical Lidocaine 4% cream applied to wound bed prior to debridement (In Clinic Only). Skin Barriers/Peri-Wound Care o Moisturizing lotion - not on wounds on both legs Primary Wound Dressing Wound #11 Left,Medial Lower Leg o Hydrafera Blue Ready Transfer - upon removing from wound moisten with warm soapy water Wound #12 Left,Posterior Lower Leg o Hydrafera Blue Ready Transfer - upon removing from wound moisten with warm soapy water Secondary Dressing Wound #11 Left,Medial Lower Leg o ABD pad Wound #12 Left,Posterior Lower Leg o ABD pad Dressing Change Frequency Wound #11 Left,Medial Lower Leg o Dressing is to be changed Monday and Thursday. - patient comes to clinic for wound care on Thursday and [facility] needs to change dressing on Mondays Wound #12 Left,Posterior Lower Leg o Dressing is to be changed Monday and Thursday. - patient comes to clinic for wound care on Thursday and [facility] needs to change dressing on Mondays KOLEMAN, MARLING ([id number]) Follow-up Appointments Wound #11 Left,Medial Lower Leg o Return Appointment in 1 week. Wound #12 Left,Posterior Lower Leg o Return Appointment in 1 week. Edema Control Wound #11 Left,Medial Lower Leg o 3 Layer Compression System - Bilateral - unna to  anchor both legs Wound #12 Left,Posterior Lower Leg o 3 Layer Compression System - Bilateral - unna to anchor both legs Additional Orders / Instructions Wound #11 Left,Medial Lower Leg o Increase protein intake. Wound #12 Left,Posterior Lower Leg o Increase protein intake. Home Health Wound #11 Left,Medial Lower Leg o Continue Home Health Visits - Please go see pt on [place] Nurse may visit PRN to address patientos wound care needs. o FACE TO FACE ENCOUNTER: MEDICARE and MEDICAID PATIENTS: I certify that this patient is under my care and that I had a face-to-face encounter that meets the physician face-to-face encounter requirements with this patient on this date. The encounter with the patient was in whole or in part for the following MEDICAL CONDITION: (primary reason for [place]) MEDICAL NECESSITY: I certify, that based on my findings, NURSING services are a medically necessary home health service. HOME BOUND STATUS: I certify that my clinical findings support that this patient is homebound (i.e., Due to illness or injury, pt requires aid of supportive devices such as crutches, cane, wheelchairs, walkers, the use of special transportation or the assistance of another person to leave their place of residence. There is a normal inability to leave the home and doing so requires considerable and taxing effort. Other absences are for medical reasons / religious services and are infrequent or of short duration when for other reasons). o If current dressing causes regression in wound condition, may D/C ordered dressing product/s and apply Normal Saline Moist Dressing daily until next [place] / Other MD appointment. [place] of regression in wound condition at [phone]. o Please direct any NON-WOUND related issues/requests for orders to patient's Primary Care Physician Wound #12 [place] Visits - Please go see pt on [place] Nurse may visit PRN to address patientos wound care needs. o FACE TO FACE ENCOUNTER: MEDICARE and MEDICAID PATIENTS: I certify that this patient is under  my care and that I had a face-to-face encounter that meets the physician face-to-face encounter requirements with this patient on this date. The encounter with the patient was in whole or in part for the following MEDICAL CONDITION: (primary reason for [place]) MEDICAL NECESSITY: I certify, that based on my findings, NURSING services are a medically necessary home health service. HOME BOUND STATUS: I certify that my clinical findings support that this patient is homebound (i.e., Due to illness or injury, pt requires aid of supportive devices such as crutches, cane, wheelchairs, walkers, the use of special transportation or the assistance of another person to leave their place of residence. There is a normal inability to leave the home and doing so requires considerable and taxing effort. Other absences are for medical reasons / religious services and are infrequent or of short duration when for other reasons). JONANTHAN, BOLENDER ([id number]) o If current dressing causes regression in wound condition, may D/C ordered dressing product/s and apply Normal Saline Moist Dressing daily until next [place] / Other MD appointment. [place] of regression in wound condition at [phone]. o Please direct any NON-WOUND related issues/requests for orders to patient's Primary Care Physician Patient Medications Allergies: Corticosteroids (Glucocorticoids) Notifications Medication Indication Start End lidocaine DOSE 1 - topical 4 % cream - 1 cream topical Electronic Signature(s) Signed: 11/02/2017 6:54:27 AM By: Lawanda Cousins Signed: 11/02/2017 5:38:20 PM By: Alric Quan Entered By: Alric Quan on 11/01/2017 14:33:24 Adam Merritt  ([id number]) -------------------------------------------------------------------------------- Prescription 11/01/2017 Patient Name: Adam Merritt Provider: Lawanda Cousins NP Date of Birth: 1946-03-26 NPI#: [id number] Sex: M DEA#: [id number] Phone #: [phone] License #: Patient Address: [facility] [place], [place] [address] [address], [place], [place] [address] [phone] Allergies Corticosteroids (Glucocorticoids) Medication Medication: Route: Strength: Form: lidocaine 4 % topical cream topical 4% cream Class: TOPICAL LOCAL ANESTHETICS Dose: Frequency / Time: Indication: 1 1 cream topical Number of Refills: Number of Units: 0 Generic Substitution: Start Date: End Date: One Time Use: Substitution Permitted No Note to Pharmacy: Signature(s): Date(s): Electronic Signature(s) Signed: 11/02/2017 6:54:27 AM By: Lawanda Cousins Signed: 11/02/2017 5:38:20 PM By: Alric Quan Entered By: Alric Quan on 11/01/2017 14:33:27 Adam Merritt ([id number]) --------------------------------------------------------------------------------  Problem List Details Patient Name: Adam Merritt Date of Service: 11/01/2017 2:00 PM Medical Record Number: [id number] Patient Account Number: [id number] Date of Birth/Sex: 07/18/1945 (72 y.o. M) Treating RN: Ahmed Prima Primary Care Provider: [name] Other Clinician: Referring Provider: [name] Treating Provider/Extender: Cathie Olden in Treatment: 2 Active Problems ICD-10 Impacting Encounter Code Description Active Date Wound Healing Diagnosis L97.222 Non-pressure chronic ulcer of left calf with fat layer exposed 10/18/2017 No Yes I89.0 Lymphedema, not elsewhere classified 10/18/2017 No Yes E66.9 Obesity, unspecified 10/18/2017 No Yes Inactive Problems Resolved Problems ICD-10 Code Description Active Date Resolved  Date L97.211 Non-pressure chronic ulcer of right calf limited to breakdown of skin 10/18/2017 10/18/2017 Electronic Signature(s) Signed: 11/01/2017 2:54:01 PM By: Lawanda Cousins Previous Signature: 11/01/2017 2:34:40 PM Version By: Lawanda Cousins Entered By: Lawanda Cousins on 11/01/2017 14:54:00 Adam Merritt ([id number]) -------------------------------------------------------------------------------- Progress Note Details Patient Name: Adam Merritt Date of Service: 11/01/2017 2:00 PM Medical Record Number: [id number] Patient Account Number: [id number] Date of Birth/Sex: 04/03/1946 (72 y.o. M) Treating RN: Ahmed Prima Primary Care Provider: [name] Other Clinician: Referring Provider: [name] Treating Provider/Extender: Cathie Olden in Treatment: 2 Subjective Chief Complaint Information obtained from  Patient he is here for evaluation of bilateral lower extremity lymphedema/venous ulcers History of Present Illness (HPI) The following HPI elements were documented for the patient's wound: Location: bilateral lower extremity Quality: no pain to ulcerations Duration: ulcerations have been present 3-4 weeks Context: ulcerations of secondary to edema, blister formation 10/18/17-He is here for initial evaluation of bilateral lower extremity ulcerations in the presence of venous insufficiency and lymphedema. He has been seen by vascular medicine in the past, Dr. Lucky Cowboy, last seen in 2016. He does have a history of abnormal ABIs, which is to be expected given his lymphedema and venous insufficiency. According to Epic, it appears that all attempts for arterial evaluation and/or angiography were not follow through with by patient. He does have a history of being seen in lymphedema clinic in 2018, stopped going approximately 6 months ago stating "it didn't do any good". He does not have lymphedema pumps, he does not have custom fit compression wrap/stockings. He is diabetic and his  recent A1c last month was 7.6. He admits to chronic bilateral lower extremity pain, no change in pain since blister and ulceration development. He is currently being treated with Levaquin for bronchitis. He has home health and we will continue. 10/25/17-He is here in follow-up evaluation for bilateral lower extremity ulcerationssubtle he remains on Levaquin for bronchitis. Right lower extremity with no evidence of drainage or ulceration, persistent left lower extremity ulceration. He states that home health has not been out since his appointment. [facility], studies revealed: RIGHT ABI 0.9, TBI 0.6 LEFT ABI 1.1, TBI 0.6 with triphasic flow bilaterally. We will continue his same treatment plan. He has been educated on compression therapy and need for elevation. He will benefit from lymphedema pumps 11/01/17-He is here in follow-up evaluation for left lower extremity ulcer. The right lower extremity remains healed. He has home health services, but they have not been out to see the patient for 2-3 weeks. He states it home health physical therapy changed his dressing yesterday after therapy; he placed Ace wrap compression. We are still waiting for lymphedema pumps, reordered d/t need for company change. Patient History Information obtained from Patient. Family History Cancer - Paternal Grandparents, Kidney Disease - Siblings, Lung Disease - Mother, No family history of Diabetes, Heart Disease, Hereditary Spherocytosis, Hypertension, Seizures, Stroke, Thyroid Problems, Tuberculosis. Social History Never smoker, Marital Status - Married, Alcohol Use - Never, Drug Use - No History, Caffeine Use - Never. Medical History Hospitalization/Surgery History - 11/03/2013, [facility], bronchitis. Medical And Surgical History Notes MCGUIRE, GASPARYAN ([id number]) Respiratory home O2 at night as needed Cardiovascular varicose veins Neurologic CVA -  1992 Objective Constitutional Vitals Time Taken: 2:08 PM, Height: 66 in, Weight: 298 lbs, BMI: 48.1, Temperature: 98.2 F, Pulse: 90 bpm, Respiratory Rate: 22 breaths/min, Blood Pressure: 136/84 mmHg. Integumentary (Hair, Skin) Wound #10 status is Healed - Epithelialized. Original cause of wound was Blister. The wound is located on the Left,Anterior Lower Leg. The wound measures 0cm length x 0cm width x 0cm depth; 0cm^2 area and 0cm^3 volume. Wound #11 status is Open. Original cause of wound was Blister. The wound is located on the Left,Medial Lower Leg. The wound measures 1.4cm length x 1.1cm width x 0.1cm depth; 1.21cm^2 area and 0.121cm^3 volume. There is Fat Layer (Subcutaneous Tissue) Exposed exposed. There is no tunneling or undermining noted. There is a large amount of serous drainage noted. The wound margin is flat and intact. There is large (67-100%) red granulation  within the wound bed. There is a small (1-33%) amount of necrotic tissue within the wound bed including Adherent Slough. The periwound skin appearance exhibited: Hemosiderin Staining. The periwound skin appearance did not exhibit: Callus, Crepitus, Excoriation, Induration, Rash, Scarring, Dry/Scaly, Maceration, Atrophie Blanche, Cyanosis, Ecchymosis, Mottled, Pallor, Rubor, Erythema. Periwound temperature was noted as No Abnormality. The periwound has tenderness on palpation. Wound #12 status is Open. Original cause of wound was Blister. The wound is located on the Left,Posterior Lower Leg. The wound measures 0.1cm length x 0.1cm width x 0.1cm depth; 0.008cm^2 area and 0.001cm^3 volume. There is Fat Layer (Subcutaneous Tissue) Exposed exposed. There is no tunneling or undermining noted. There is a large amount of serous drainage noted. The wound margin is flat and intact. There is large (67-100%) red granulation within the wound bed. There is a small (1-33%) amount of necrotic tissue within the wound bed including Adherent  Slough. The periwound skin appearance exhibited: Hemosiderin Staining. The periwound skin appearance did not exhibit: Callus, Crepitus, Excoriation, Induration, Rash, Scarring, Dry/Scaly, Maceration, Atrophie Blanche, Cyanosis, Ecchymosis, Mottled, Pallor, Rubor, Erythema. Periwound temperature was noted as No Abnormality. 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 ([id number]) Plan Wound Cleansing: Wound #11 Left,Medial Lower Leg: Clean wound with Normal Saline. Cleanse wound with mild soap and water Wound #12 Left,Posterior Lower Leg: Clean wound with Normal Saline. Cleanse wound with mild soap and water Anesthetic (add to Medication List): Wound #11 Left,Medial Lower Leg: Topical Lidocaine 4% cream applied to wound bed prior to debridement (In Clinic Only). Wound #12 Left,Posterior Lower Leg: Topical Lidocaine 4% cream applied to wound bed prior to debridement (In Clinic Only). Skin Barriers/Peri-Wound Care: Moisturizing lotion - not on wounds on both legs Primary Wound Dressing: Wound #11 Left,Medial Lower Leg: Hydrafera Blue Ready Transfer - upon removing from wound moisten with warm soapy water Wound #12 Left,Posterior Lower Leg: Hydrafera Blue Ready Transfer - upon removing from wound moisten with warm soapy water Secondary Dressing: Wound #11 Left,Medial Lower Leg: ABD pad Wound #12 Left,Posterior Lower Leg: ABD pad Dressing Change Frequency: Wound #11 Left,Medial Lower Leg: Dressing is to be changed Monday and Thursday. - patient comes to clinic for wound care on Thursday and [facility] needs to change dressing on Mondays Wound #12 Left,Posterior Lower Leg: Dressing is to be changed Monday and Thursday. - patient comes to clinic for wound care on Thursday and [facility] needs to change dressing on Mondays Follow-up  Appointments: Wound #11 Left,Medial Lower Leg: Return Appointment in 1 week. Wound #12 Left,Posterior Lower Leg: Return Appointment in 1 week. Edema Control: Wound #11 Left,Medial Lower Leg: 3 Layer Compression System - Bilateral - unna to anchor both legs Wound #12 Left,Posterior Lower Leg: 3 Layer Compression System - Bilateral - unna to anchor both legs Additional Orders / Instructions: Wound #11 Left,Medial Lower Leg: Increase protein intake. Wound #12 Left,Posterior Lower Leg: Increase protein intake. Home Health: Wound #11 Left,Medial Lower Leg: Continue Home Health Visits - Please go see pt on [place] Nurse may visit PRN to address patient s wound care needs. FACE TO FACE ENCOUNTER: MEDICARE and MEDICAID PATIENTS: I certify that this patient is under my care and that I had a face-to-face encounter that meets the physician face-to-face encounter requirements with this patient on this date. The encounter with the patient was in whole or in part for the following MEDICAL  CONDITION: (primary reason for Home Healthcare) MEDICAL NECESSITY: I certify, that based on my findings, NURSING services are a medically necessary home SHAKAI, DOLLEY ([id number]) health service. HOME BOUND STATUS: I certify that my clinical findings support that this patient is homebound (i.e., Due to illness or injury, pt requires aid of supportive devices such as crutches, cane, wheelchairs, walkers, the use of special transportation or the assistance of another person to leave their place of residence. There is a normal inability to leave the home and doing so requires considerable and taxing effort. Other absences are for medical reasons / religious services and are infrequent or of short duration when for other reasons). If current dressing causes regression in wound condition, may D/C ordered dressing product/s and apply Normal Saline Moist Dressing daily until next [place] / Other  MD appointment. [place] of regression in wound condition at [phone]. Please direct any NON-WOUND related issues/requests for orders to patient's Primary Care Physician Wound #12 Left,Posterior Lower Leg: [place] Visits - Please go see pt on [place] Nurse may visit PRN to address patient s wound care needs. FACE TO FACE ENCOUNTER: MEDICARE and MEDICAID PATIENTS: I certify that this patient is under my care and that I had a face-to-face encounter that meets the physician face-to-face encounter requirements with this patient on this date. The encounter with the patient was in whole or in part for the following MEDICAL CONDITION: (primary reason for [employer]) MEDICAL NECESSITY: I certify, that based on my findings, NURSING services are a medically necessary home health service. HOME BOUND STATUS: I certify that my clinical findings support that this patient is homebound (i.e., Due to illness or injury, pt requires aid of supportive devices such as crutches, cane, wheelchairs, walkers, the use of special transportation or the assistance of another person to leave their place of residence. There is a normal inability to leave the home and doing so requires considerable and taxing effort. Other absences are for medical reasons / religious services and are infrequent or of short duration when for other reasons). If current dressing causes regression in wound condition, may D/C ordered dressing product/s and apply Normal Saline Moist Dressing daily until next [place] / Other MD appointment. [address] of regression in wound condition at [phone]. Please direct any NON-WOUND related issues/requests for orders to patient's Primary Care Physician The following medication(s) was prescribed: lidocaine topical 4 % cream 1 1 cream topical was prescribed at facility Electronic Signature(s) Signed: 11/01/2017 2:54:26 PM By:  Lawanda Cousins Previous Signature: 11/01/2017 2:52:37 PM Version By: Lawanda Cousins Entered By: Lawanda Cousins on 11/01/2017 14:54:26 Adam Merritt ([id number]) -------------------------------------------------------------------------------- ROS/PFSH Details Patient Name: Adam Merritt Date of Service: 11/01/2017 2:00 PM Medical Record Number: [id number] Patient Account Number: [id number] Date of Birth/Sex: 1946/03/14 (72 y.o. M) Treating RN: Ahmed Prima Primary Care Provider: [name] Other Clinician: Referring Provider: [name] Treating Provider/Extender: Cathie Olden in Treatment: 2 Information Obtained From Patient Wound History Do you currently have one or more open woundso Yes How many open wounds do you currently haveo 3 Approximately how long have you had your woundso 4 weeks How have you been treating your wound(s) until nowo kerlix and ace wraps Has your wound(s) ever healed and then re-openedo No Have you had any lab work done in the past montho No Have you tested positive for an antibiotic resistant organism (MRSA, VRE)o No Have you tested positive for  osteomyelitis (bone infection)o No Have you had any tests for circulation on your legso Yes Who ordered the testo [facility] Where was the test doneo [facility] Have you had other problems associated with your woundso Swelling Eyes Medical History: Negative for: Cataracts; Glaucoma; Optic Neuritis Ear/Nose/Mouth/Throat Medical History: Negative for: Chronic sinus problems/congestion; Middle ear problems Hematologic/Lymphatic Medical History: Positive for: Anemia - aplastic anemia; Lymphedema Negative for: Hemophilia; Human Immunodeficiency Virus; Sickle Cell Disease Respiratory Medical History: Positive for: Chronic Obstructive Pulmonary Disease (COPD); Sleep Apnea Negative for: Aspiration; Asthma; Pneumothorax; Tuberculosis Past Medical History Notes: home O2 at night as needed Cardiovascular Medical  History: Positive for: Congestive Heart Failure; Coronary Artery Disease; Hypertension; Peripheral Venous Disease Negative for: Angina; Arrhythmia; Deep Vein Thrombosis; Hypotension; Myocardial Infarction; Peripheral Arterial Disease; Phlebitis; Vasculitis Past Medical History Notes: varicose veins ANGELL, HONSE ([id number]) Gastrointestinal Medical History: Negative for: Cirrhosis ; Colitis; Crohnos; Hepatitis A; Hepatitis B; Hepatitis C Endocrine Medical History: Positive for: Type II Diabetes Negative for: Type I Diabetes Time with diabetes: since 2006 Treated with: Insulin Blood sugar tested every day: Yes Tested : Blood sugar testing results: Breakfast: 209 Genitourinary Medical History: Negative for: End Stage Renal Disease Immunological Medical History: Negative for: Lupus Erythematosus; Raynaudos; Scleroderma Integumentary (Skin) Medical History: Negative for: History of Burn; History of pressure wounds Musculoskeletal Medical History: Positive for: Osteoarthritis Negative for: Gout; Rheumatoid Arthritis; Osteomyelitis Neurologic Medical History: Positive for: Neuropathy Negative for: Dementia; Quadriplegia; Paraplegia; Seizure Disorder Past Medical History Notes: CVA - 1992 Oncologic Medical History: Positive for: Received Chemotherapy - last dose 2006 Negative for: Received Radiation Psychiatric Medical History: Negative for: Anorexia/bulimia; Confinement Anxiety Immunizations Pneumococcal Vaccine: SHOLOM, DULUDE ([id number]) Received Pneumococcal Vaccination: Yes Implantable Devices Hospitalization / Surgery History Name of Hospital Purpose of Hospitalization/Surgery Date [facility] bronchitis 11/03/2013 Family and Social History Cancer: Yes - Paternal Grandparents; Diabetes: No; Heart Disease: No; Hereditary Spherocytosis: No; Hypertension: No; Kidney Disease: Yes - Siblings; Lung Disease: Yes - Mother; Seizures: No; Stroke: No; Thyroid Problems: No;  Tuberculosis: No; Never smoker; Marital Status - Married; Alcohol Use: Never; Drug Use: No History; Caffeine Use: Never; Financial Concerns: No; Food, Clothing or Shelter Needs: No; Support System Lacking: No; Transportation Concerns: No; Advanced Directives: No; Patient does not want information on Advanced Directives Physician Affirmation I have reviewed and agree with the above information. Electronic Signature(s) Signed: 11/02/2017 6:54:27 AM By: Lawanda Cousins Signed: 11/02/2017 5:38:20 PM By: Alric Quan Entered By: Lawanda Cousins on 11/01/2017 14:47:59 Adam Merritt ([id number]) -------------------------------------------------------------------------------- [place] Details Patient Name: Adam Merritt Date of Service: 11/01/2017 Medical Record Number: [id number] Patient Account Number: [id number] Date of Birth/Sex: [birth year] (72 y.o. 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 Physician Procedures CPT4 Code: [id number] Description: 02542 - WC PHYS LEVEL 3 - EST PT ICD-10 Diagnosis Description L97.222 Non-pressure chronic ulcer of left calf with fat layer expo Modifier: sed Quantity: 1 Electronic Signature(s) Signed: 11/01/2017 4:11:40 PM By: Alric Quan Signed: 11/02/2017 6:54:27 AM By: Lawanda Cousins Previous Signature: 11/01/2017 2:53:35 PM Version By: Lawanda Cousins Entered By:  Alric Quan on 11/01/2017 16:11:39

## 2017-11-08 ENCOUNTER — Encounter: Payer: Medicare Other | Attending: Nurse Practitioner | Admitting: Nurse Practitioner

## 2017-11-08 DIAGNOSIS — I890 Lymphedema, not elsewhere classified: Secondary | ICD-10-CM | POA: Diagnosis not present

## 2017-11-08 DIAGNOSIS — E1140 Type 2 diabetes mellitus with diabetic neuropathy, unspecified: Secondary | ICD-10-CM | POA: Insufficient documentation

## 2017-11-08 DIAGNOSIS — M79604 Pain in right leg: Secondary | ICD-10-CM | POA: Diagnosis not present

## 2017-11-08 DIAGNOSIS — E669 Obesity, unspecified: Secondary | ICD-10-CM | POA: Diagnosis not present

## 2017-11-08 DIAGNOSIS — L97211 Non-pressure chronic ulcer of right calf limited to breakdown of skin: Secondary | ICD-10-CM | POA: Insufficient documentation

## 2017-11-08 DIAGNOSIS — G4730 Sleep apnea, unspecified: Secondary | ICD-10-CM | POA: Insufficient documentation

## 2017-11-08 DIAGNOSIS — L97222 Non-pressure chronic ulcer of left calf with fat layer exposed: Secondary | ICD-10-CM | POA: Diagnosis present

## 2017-11-08 DIAGNOSIS — I509 Heart failure, unspecified: Secondary | ICD-10-CM | POA: Diagnosis not present

## 2017-11-08 DIAGNOSIS — M79605 Pain in left leg: Secondary | ICD-10-CM | POA: Diagnosis not present

## 2017-11-08 DIAGNOSIS — G8929 Other chronic pain: Secondary | ICD-10-CM | POA: Diagnosis not present

## 2017-11-08 DIAGNOSIS — I110 Hypertensive heart disease with heart failure: Secondary | ICD-10-CM | POA: Insufficient documentation

## 2017-11-08 DIAGNOSIS — I2510 Atherosclerotic heart disease of native coronary artery without angina pectoris: Secondary | ICD-10-CM | POA: Insufficient documentation

## 2017-11-08 DIAGNOSIS — Z6841 Body Mass Index (BMI) 40.0 and over, adult: Secondary | ICD-10-CM | POA: Diagnosis not present

## 2017-11-08 DIAGNOSIS — E11622 Type 2 diabetes mellitus with other skin ulcer: Secondary | ICD-10-CM | POA: Insufficient documentation

## 2017-11-08 DIAGNOSIS — J449 Chronic obstructive pulmonary disease, unspecified: Secondary | ICD-10-CM | POA: Diagnosis not present

## 2017-11-08 DIAGNOSIS — Z794 Long term (current) use of insulin: Secondary | ICD-10-CM | POA: Diagnosis not present

## 2017-11-08 DIAGNOSIS — Z8673 Personal history of transient ischemic attack (TIA), and cerebral infarction without residual deficits: Secondary | ICD-10-CM | POA: Insufficient documentation

## 2017-11-08 DIAGNOSIS — I872 Venous insufficiency (chronic) (peripheral): Secondary | ICD-10-CM | POA: Diagnosis not present

## 2017-11-08 DIAGNOSIS — E1151 Type 2 diabetes mellitus with diabetic peripheral angiopathy without gangrene: Secondary | ICD-10-CM | POA: Insufficient documentation

## 2017-11-08 DIAGNOSIS — M1990 Unspecified osteoarthritis, unspecified site: Secondary | ICD-10-CM | POA: Insufficient documentation

## 2017-11-09 NOTE — Progress Notes (Signed)
NAFTOLI, PENNY ([id number]) Visit Report for 11/08/2017 Arrival Information Details Patient Name: Adam Merritt, Adam Merritt Date of Service: 11/08/2017 1:15 PM Medical Record Number: [id number] Patient Account Number: [id number] Date of Birth/Sex: 01-29-1946 (72 y.o. M) Treating RN: Montey Hora Primary Care Arzu Mcgaughey: [name] Other Clinician: Referring Slate Debroux: [name] Treating Danajah Birdsell/Extender: Cathie Olden in Treatment: 3 Visit Information History Since Last Visit Added or deleted any medications: No Patient Arrived: Cane Any new allergies or adverse reactions: No Arrival Time: 13:19 Had a fall or experienced change in No Accompanied By: spouse activities of daily living that may affect Transfer Assistance: None risk of falls: Patient Identification Verified: Yes Signs or symptoms of abuse/neglect since last visito No Secondary Verification Process Completed: Yes Hospitalized since last visit: No Patient Has Alerts: Yes Implantable device outside of the clinic excluding No Patient Alerts: DMII cellular tissue based products placed in the center since last visit: Has Dressing in Place as Prescribed: Yes Has Compression in Place as Prescribed: No Pain Present Now: No Electronic Signature(s) Signed: 11/08/2017 4:04:19 PM By: Montey Hora Entered By: Montey Hora on 11/08/2017 13:19:53 Adam Merritt ([id number]) -------------------------------------------------------------------------------- Encounter Discharge Information Details Patient Name: Adam Merritt Date of Service: 11/08/2017 1:15 PM Medical Record Number: [id number] Patient Account Number: [id number] Date of Birth/Sex: 1945/08/01 (72 y.o. M) Treating RN: Roger Shelter Primary Care Ferrell Flam: [name] Other Clinician: Referring Sasha Rogel: [name] Treating Kynnedy Carreno/Extender: Cathie Olden in Treatment: 3 Encounter Discharge Information Items Discharge Condition: Stable Ambulatory Status:  Cane Discharge Destination: Home Transportation: Private Auto Schedule Follow-up Appointment: Yes Clinical Summary of Care: Provided Form Type Recipient Paper Patient jt Electronic Signature(s) Signed: 11/08/2017 2:03:40 PM By: Lorine Bears RCP, RRT, CHT Entered By: Lorine Bears on 11/08/2017 14:03:39 Adam Merritt ([id number]) -------------------------------------------------------------------------------- Lower Extremity Assessment Details Patient Name: Adam Merritt Date of Service: 11/08/2017 1:15 PM Medical Record Number: [id number] Patient Account Number: [id number] Date of Birth/Sex: 06/13/1945 (72 y.o. M) Treating RN: Montey Hora Primary Care Sadeen Wiegel: [name] Other Clinician: Referring Briella Hobday: [name] Treating Carter Kaman/Extender: Cathie Olden in Treatment: 3 Edema Assessment Assessed: [Left: No] [Right: No] [Left: Edema] [Right: :] Calf Left: Right: Point of Measurement: 32 cm From Medial Instep 51 cm 61.1 cm Ankle Left: Right: Point of Measurement: 11 cm From Medial Instep 37.4 cm 30.1 cm Vascular Assessment Pulses: Dorsalis Pedis Palpable: [Left:Yes] [Right:Yes] Posterior Tibial Extremity colors, hair growth, and conditions: Extremity Color: [Left:Hyperpigmented] [Right:Hyperpigmented] Hair Growth on Extremity: [Left:No] [Right:No] Temperature of Extremity: [Left:Warm] [Right:Warm] Capillary Refill: [Left:< 3 seconds] [Right:< 3 seconds] Toe Nail Assessment Left: Right: Thick: Yes Yes Discolored: Yes Yes Deformed: No No Improper Length and Hygiene: Yes Yes Electronic Signature(s) Signed: 11/08/2017 4:04:19 PM By: Montey Hora Entered By: Montey Hora on 11/08/2017 13:29:46 Adam Merritt ([id number]) -------------------------------------------------------------------------------- Multi Wound Chart Details Patient Name: Adam Merritt Date of Service: 11/08/2017 1:15 PM Medical Record Number:  [id number] Patient Account Number: [id number] Date of Birth/Sex: 30-Jun-1945 (72 y.o. 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] [12:Diabetic Wound/Ulcer of the Lower Extremity] [N/A:N/A] Secondary Etiology: [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] [  12:Venous Leg Ulcer] [N/A:N/A] Comorbid History: [11:Anemia, Lymphedema, Chronic Obstructive Pulmonary Disease (COPD), Sleep Apnea, Congestive Heart Failure, Coronary Artery Disease, Hypertension, Peripheral Venous Disease, Type II Diabetes, Osteoarthritis, Neuropathy, Received  Chemotherapy] [12:N/A] [N/A:N/A] Date Acquired: [11:10/01/2017] [12:10/01/2017] [N/A:N/A] Weeks of Treatment: [11:3] [12:3] [N/A:N/A] Wound Status: [11:Open] [12:Healed - Epithelialized] [N/A:N/A] Measurements L x W x D [11:0.6x0.6x0.1] [12:0x0x0] [N/A:N/A] (cm) Area (cm) : [11:0.283] [12:0] [N/A:N/A] Volume (cm) : [11:0.028] [12:0] [N/A:N/A] % Reduction in Area: [11:94.80%] [12:100.00%] [N/A:N/A] % Reduction in Volume: [11:94.80%] [12:100.00%] [N/A:N/A] Classification: [11:Grade 1] [12:Grade 1] [N/A:N/A] Exudate Amount: [11:Small] [12:N/A] [N/A:N/A] Exudate Type: [11:Serous] [12:N/A] [N/A:N/A] Exudate Color: [11:amber] [12:N/A] [N/A:N/A] Wound Margin: [11:Flat and Intact] [12:N/A] [N/A:N/A] Granulation Amount: [11:Large (67-100%)] [12:N/A] [N/A:N/A] Granulation Quality: [11:Red] [12:N/A] [N/A:N/A] Necrotic Amount: [11:Small  (1-33%)] [12:N/A] [N/A:N/A] Exposed Structures: [11:Fat Layer (Subcutaneous Tissue) Exposed: Yes Fascia: No Tendon: No] [12:N/A] [N/A:N/A] Muscle: No Joint: No Bone: No Epithelialization: Small (1-33%) N/A N/A Periwound Skin Texture: Excoriation: No No Abnormalities Noted N/A Induration: No Callus: No Crepitus: No Rash: No Scarring: No Periwound Skin Moisture: Maceration: No No Abnormalities Noted N/A Dry/Scaly: No Periwound Skin Color: Hemosiderin Staining: Yes No Abnormalities Noted N/A Atrophie Blanche: No Cyanosis: No Ecchymosis: No Erythema: No Mottled: No Pallor: No Rubor: No Temperature: No Abnormality N/A N/A Tenderness on Palpation: Yes No N/A Wound Preparation: Ulcer Cleansing: N/A N/A Rinsed/Irrigated with Saline Topical Anesthetic Applied: Other: lidocaine 4% Treatment Notes Electronic Signature(s) Signed: 11/08/2017 1:42:16 PM By: Lawanda Cousins Entered By: Lawanda Cousins on 11/08/2017 13:42:16 Adam Merritt ([id number]) -------------------------------------------------------------------------------- [place] Details Patient Name: Adam Merritt Date of Service: 11/08/2017 1:15 PM Medical Record Number: [id number] Patient Account Number: [id number] Date of Birth/Sex: Aug 29, 1945 (72 y.o. M) Treating RN: Ahmed Prima Primary Care Maximum Reiland: [name] Other Clinician: Referring Magin Balbi: [name] Treating [place] Scheiderer/Extender: Cathie Olden in Treatment: 3 Active Inactive ` Abuse / Safety / Falls / Self Care Management Nursing Diagnoses: Potential for falls Goals: Patient will not experience any injury related to falls Date Initiated: 10/18/2017 Target Resolution Date: 02/09/2018 Goal Status: Active Interventions: Assess Activities of Daily Living upon admission and as needed Assess fall risk on admission and as needed Assess: immobility, friction, shearing, incontinence upon admission and as needed Assess impairment of  mobility on admission and as needed per policy Assess personal safety and home safety (as indicated) on admission and as needed Assess self care needs on admission and as needed Notes: ` Orientation to the Wound Care Program Nursing Diagnoses: Knowledge deficit related to the wound healing center program Goals: Patient/caregiver will verbalize understanding of the [place] Program Date Initiated: 10/18/2017 Target Resolution Date: 11/10/2017 Goal Status: Active Interventions: Provide education on orientation to the wound center Notes: ` Wound/Skin Impairment Nursing Diagnoses: Impaired tissue integrity Knowledge deficit related to ulceration/compromised skin integrity Adam Merritt, Adam Merritt ([id number]) Goals: Ulcer/skin breakdown will have a volume reduction of 80% by week 12 Date Initiated: 10/18/2017 Target Resolution Date: 01/05/2018 Goal Status: Active Interventions: Assess patient/caregiver ability to perform ulcer/skin care regimen upon admission and as needed Assess ulceration(s) every visit Notes: Electronic Signature(s) Signed: 11/08/2017 2:43:37 PM By: Alric Quan Entered By: Alric Quan on 11/08/2017 13:32:42 Adam Merritt ([id number]) -------------------------------------------------------------------------------- Pain Assessment Details Patient Name: Adam Merritt Date of Service: 11/08/2017 1:15 PM Medical Record Number: [id number] Patient Account Number: [id number] Date of Birth/Sex: [birth year] (72 y.o. M) Treating RN: Montey Hora Primary Care Hutson Luft: [name] Other Clinician: Referring Nahiem Dredge: [name] Treating Jacee Enerson/Extender: Cathie Olden in Treatment:  3 Active Problems Location of Pain Severity and Description of Pain Patient Has Paino No Site Locations Pain Management and Medication Current Pain Management: Electronic Signature(s) Signed: 11/08/2017 4:04:19 PM By: Montey Hora Entered By: Montey Hora on  11/08/2017 13:20:02 Adam Merritt ([id number]) -------------------------------------------------------------------------------- Patient/Caregiver Education Details Patient Name: Adam Merritt Date of Service: 11/08/2017 1:15 PM Medical Record Number: [id number] Patient Account Number: [id number] Date of Birth/Gender: April 25, 1946 (71 y.o. M) Treating RN: Roger Shelter Primary Care Physician: [name] Other Clinician: Referring Physician: [name] Treating Physician/Extender: Cathie Olden in Treatment: 3 Education Assessment Education Provided To: Patient Education Topics Provided Wound/Skin Impairment: Handouts: Caring for Your Ulcer Methods: Explain/Verbal Responses: State content correctly Electronic Signature(s) Signed: 11/08/2017 2:51:17 PM By: Roger Shelter Entered By: Roger Shelter on 11/08/2017 14:03:09 Adam Merritt ([id number]) -------------------------------------------------------------------------------- Wound Assessment Details Patient Name: Adam Merritt Date of Service: 11/08/2017 1:15 PM Medical Record Number: [id number] Patient Account Number: [id number] Date of Birth/Sex: 10-24-1945 (72 y.o. M) Treating RN: Montey Hora Primary Care Nasteho Glantz: [name] Other Clinician: Referring Jadira Nierman: [name] Treating Donella Pascarella/Extender: Cathie Olden in Treatment: 3 Wound Status Wound Number: 11 Primary Diabetic Wound/Ulcer of the Lower Extremity Etiology: Wound Location: Left Lower Leg - Medial Secondary Venous Leg Ulcer Wounding Event: Blister Etiology: Date Acquired: 10/01/2017 Wound Open Weeks Of Treatment: 3 Status: Clustered Wound: No Comorbid Anemia, Lymphedema, Chronic Obstructive History: Pulmonary Disease (COPD), Sleep Apnea, Congestive Heart Failure, Coronary Artery Disease, Hypertension, Peripheral Venous Disease, Type II Diabetes, Osteoarthritis, Neuropathy, Received Chemotherapy Photos Photo Uploaded By: Montey Hora on 11/08/2017 16:03:11 Wound Measurements Length: (cm) 0.6 Width: (cm) 0.6 Depth: (cm) 0.1 Area: (cm) 0.283 Volume: (cm) 0.028 % Reduction in Area: 94.8% % Reduction in Volume: 94.8% Epithelialization: Small (1-33%) Tunneling: No Undermining: No Wound Description Classification: Grade 1 Wound Margin: Flat and Intact Exudate Amount: Small Exudate Type: Serous Exudate Color: amber Foul Odor After Cleansing: No Slough/Fibrino Yes Wound Bed Granulation Amount: Large (67-100%) Exposed Structure Granulation Quality: Red Fascia Exposed: No Necrotic Amount: Small (1-33%) Fat Layer (Subcutaneous Tissue) Exposed: Yes Necrotic Quality: Adherent Slough Tendon Exposed: No Adam Merritt, Adam Merritt ([id number]) Muscle Exposed: No Joint Exposed: No Bone Exposed: No Periwound Skin Texture Texture Color No Abnormalities Noted: No No Abnormalities Noted: No Callus: No Atrophie Blanche: No Crepitus: No Cyanosis: No Excoriation: No Ecchymosis: No Induration: No Erythema: No Rash: No Hemosiderin Staining: Yes Scarring: No Mottled: No Pallor: No Moisture Rubor: No No Abnormalities Noted: No Dry / Scaly: No Temperature / Pain Maceration: No Temperature: No Abnormality Tenderness on Palpation: Yes Wound Preparation Ulcer Cleansing: Rinsed/Irrigated with Saline Topical Anesthetic Applied: Other: lidocaine 4%, Treatment Notes Wound #11 (Left, Medial Lower Leg) 1. Cleansed with: Clean wound with Normal Saline 2. Anesthetic Topical Lidocaine 4% cream to wound bed prior to debridement 3. Peri-wound Care: Moisturizing lotion 4. Dressing Applied: Hydrafera Blue 5. Secondary Dressing Applied ABD Pad 7. Secured with 3 Layer Compression System - Bilateral Notes unna boot to Engineer, production) Signed: 11/08/2017 4:04:19 PM By: Montey Hora Entered By: Montey Hora on 11/08/2017 13:24:41 Adam Merritt  ([id number]) -------------------------------------------------------------------------------- Wound Assessment Details Patient Name: Adam Merritt Date of Service: 11/08/2017 1:15 PM Medical Record Number: [id number] Patient Account Number: [id number] Date of Birth/Sex: [birth year] (72 y.o. M) Treating RN: Montey Hora Primary Care Daundre Biel: [name] Other Clinician: Referring Zahli Vetsch: [name] Treating Tuan Tippin/Extender: Cathie Olden in Treatment: 3 Wound Status Wound Number: 12 Primary Etiology: Diabetic Wound/Ulcer of the Lower Extremity Wound Location:  Left, Posterior Lower Leg Secondary Venous Leg Ulcer Wounding Event: Blister Etiology: Date Acquired: 10/01/2017 Wound Status: Healed - Epithelialized Weeks Of Treatment: 3 Clustered Wound: No Photos Photo Uploaded By: Montey Hora on 11/08/2017 16:03:12 Wound Measurements Length: (cm) 0 % Width: (cm) 0 % Depth: (cm) 0 Area: (cm) 0 Volume: (cm) 0 Reduction in Area: 100% Reduction in Volume: 100% Wound Description Classification: Grade 1 Periwound Skin Texture Texture Color No Abnormalities Noted: No No Abnormalities Noted: No Moisture No Abnormalities Noted: No Electronic Signature(s) Signed: 11/08/2017 4:04:19 PM By: Montey Hora Entered By: Montey Hora on 11/08/2017 13:23:16 Adam Merritt ([id number]) -------------------------------------------------------------------------------- [place] Details Patient Name: Adam Merritt Date of Service: 11/08/2017 1:15 PM Medical Record Number: [id number] Patient Account Number: [id number] Date of Birth/Sex: 1945/09/09 (72 y.o. M) Treating RN: Montey Hora Primary Care Vincenta Steffey: [name] Other Clinician: Referring Mason Dibiasio: [name] Treating Cyrilla Durkin/Extender: Cathie Olden in Treatment: 3 Vital Signs Time Taken: 13:20 Temperature (F): 98.6 Height (in): 66 Pulse (bpm): 77 Weight (lbs): 298 Respiratory Rate (breaths/min): 18 Body  Mass Index (BMI): 48.1 Blood Pressure (mmHg): 147/75 Reference Range: 80 - 120 mg / dl Pulse Oximetry (%): 98 Electronic Signature(s) Signed: 11/08/2017 4:04:19 PM By: Montey Hora Entered By: Montey Hora on 11/08/2017 13:22:07

## 2017-11-09 NOTE — Progress Notes (Addendum)
Adam Merritt, Adam Merritt ([id number]) Visit Report for 11/08/2017 Chief Complaint Document Details Patient Name: Adam Merritt, Adam Merritt Date of Service: 11/08/2017 1:15 PM Medical Record Number: [id number] Patient Account Number: [id number] Date of Birth/Sex: 02-13-1946 (72 y.o. M) Treating RN: Ahmed Prima Primary Care Provider: [name] Other Clinician: Referring Provider: [name] Treating Provider/Extender: Cathie Olden in Treatment: 3 Information Obtained from: Patient Chief Complaint he is here for evaluation of bilateral lower extremity lymphedema/venous ulcers Electronic Signature(s) Signed: 11/08/2017 1:42:32 PM By: Lawanda Cousins Entered By: Lawanda Cousins on 11/08/2017 13:42:32 Adam Merritt ([id number]) -------------------------------------------------------------------------------- HPI Details Patient Name: Adam Merritt Date of Service: 11/08/2017 1:15 PM Medical Record Number: [id number] Patient Account Number: [id number] Date of Birth/Sex: 01-31-1946 (72 y.o. M) Treating RN: Ahmed Prima Primary Care Provider: [name] Other Clinician: Referring Provider: [name] Treating Provider/Extender: Cathie Olden in Treatment: 3 History of Present Illness Location: bilateral lower extremity Quality: no pain to ulcerations Duration: ulcerations have been present 3-4 weeks Context: ulcerations of secondary to edema, blister formation HPI Description: 10/18/17-He is here for initial evaluation of bilateral lower extremity ulcerations in the presence of venous insufficiency and lymphedema. He has been seen by vascular medicine in the past, Dr. Lucky Cowboy, last seen in 2016. He does have a history of abnormal ABIs, which is to be expected given his lymphedema and venous insufficiency. According to Epic, it appears that all attempts for arterial evaluation and/or angiography were not follow through with by patient. He does have a history of being seen in lymphedema clinic in 2018,  stopped going approximately 6 months ago stating "it didn't do any good". He does not have lymphedema pumps, he does not have custom fit compression wrap/stockings. He is diabetic and his recent A1c last month was 7.6. He admits to chronic bilateral lower extremity pain, no change in pain since blister and ulceration development. He is currently being treated with Levaquin for bronchitis. He has home health and we will continue. 10/25/17-He is here in follow-up evaluation for bilateral lower extremity ulcerationssubtle he remains on Levaquin for bronchitis. Right lower extremity with no evidence of drainage or ulceration, persistent left lower extremity ulceration. He states that home health has not been out since his appointment. He went to [facility] on Tuesday, studies revealed: RIGHT ABI 0.9, TBI 0.6 LEFT ABI 1.1, TBI 0.6 with triphasic flow bilaterally. We will continue his same treatment plan. He has been educated on compression therapy and need for elevation. He will benefit from lymphedema pumps 11/01/17-He is here in follow-up evaluation for left lower extremity ulcer. The right lower extremity remains healed. He has home health services, but they have not been out to see the patient for 2-3 weeks. He states it home health physical therapy changed his dressing yesterday after therapy; he placed Ace wrap compression. We are still waiting for lymphedema pumps, reordered d/t need for company change. 11/08/17 He is here for evaluation of left lower extermity ulcer. It is improved. Edema is significantly improved with compression therapy. We will continue with same treatment plan and he will follow-up next week. No word regarding lymphedema pumps Electronic Signature(s) Signed: 11/15/2017 2:13:05 PM By: Lawanda Cousins Previous Signature: 11/08/2017 1:43:19 PM Version By: Lawanda Cousins Entered By: Lawanda Cousins on 11/15/2017 14:13:05 Adam Merritt  ([id number]) -------------------------------------------------------------------------------- Physician Orders Details Patient Name: Adam Merritt Date of Service: 11/08/2017 1:15 PM Medical Record Number: [id number] Patient Account Number: [id number] Date of Birth/Sex: 17-Sep-1945 (72 y.o. M) Treating RN: Carolyne Fiscal,  Debi Primary Care Provider: [name] Other Clinician: Referring Provider: [name] Treating Provider/Extender: Cathie Olden in Treatment: 3 Verbal / Phone Orders: Yes Clinician: Pinkerton, Debi Read Back and Verified: Yes Diagnosis Coding Wound Cleansing Wound #11 Left,Medial Lower Leg o Clean wound with Normal Saline. o Cleanse wound with mild soap and water Anesthetic (add to Medication List) Wound #11 Left,Medial Lower Leg o Topical Lidocaine 4% cream applied to wound bed prior to debridement (In Clinic Only). Skin Barriers/Peri-Wound Care o Moisturizing lotion - not on wounds on both legs Primary Wound Dressing Wound #11 Left,Medial Lower Leg o Hydrafera Blue Ready Transfer - upon removing from wound moisten with warm soapy water Secondary Dressing Wound #11 Left,Medial Lower Leg o ABD pad Dressing Change Frequency Wound #11 Left,Medial Lower Leg o Dressing is to be changed Monday and Thursday. - patient comes to clinic for wound care on Thursday and [facility] needs to change dressing on Mondays Follow-up Appointments Wound #11 Left,Medial Lower Leg o Return Appointment in 1 week. Edema Control Wound #11 Left,Medial Lower Leg o 3 Layer Compression System - Bilateral - PLEASE MAKE SURE YOU ARE PUTTING ON A 3-LAYER WRAP SYSTEM BILATERALLY unna to anchor both legs Additional Orders / Instructions Wound #11 Left,Medial Lower Leg o Increase protein intake. [place] ([id number]) Wound #11 Left,Medial Lower Leg o [place] Visits - Please go see pt on [place] Nurse may visit PRN to  address patientos wound care needs. o FACE TO FACE ENCOUNTER: MEDICARE and MEDICAID PATIENTS: I certify that this patient is under my care and that I had a face-to-face encounter that meets the physician face-to-face encounter requirements with this patient on this date. The encounter with the patient was in whole or in part for the following MEDICAL CONDITION: (primary reason for [place]) MEDICAL NECESSITY: I certify, that based on my findings, NURSING services are a medically necessary home health service. HOME BOUND STATUS: I certify that my clinical findings support that this patient is homebound (i.e., Due to illness or injury, pt requires aid of supportive devices such as crutches, cane, wheelchairs, walkers, the use of special transportation or the assistance of another person to leave their place of residence. There is a normal inability to leave the home and doing so requires considerable and taxing effort. Other absences are for medical reasons / religious services and are infrequent or of short duration when for other reasons). o If current dressing causes regression in wound condition, may D/C ordered dressing product/s and apply Normal Saline Moist Dressing daily until next [place] / Other MD appointment. [place] of regression in wound condition at [phone]. o Please direct any NON-WOUND related issues/requests for orders to patient's Primary Care Physician Patient Medications Allergies: Corticosteroids (Glucocorticoids) Notifications Medication Indication Start End lidocaine DOSE 1 - topical 4 % cream - 1 cream topical Electronic Signature(s) Signed: 11/08/2017 2:43:37 PM By: Alric Quan Signed: 11/08/2017 4:23:13 PM By: Lawanda Cousins Entered By: Alric Quan on 11/08/2017 13:37:31 Adam Merritt, Adam Merritt ([id number]) -------------------------------------------------------------------------------- Prescription 11/08/2017 Patient  Name: Adam Merritt Provider: Lawanda Cousins NP Date of Birth: 09-29-1945 NPI#: [id number] Sex: [name] DEA#: [id number] Phone #: [phone] License #: Patient Address: [name] [place], [place] [address] [address], [place],  [address] [phone] Allergies Corticosteroids (Glucocorticoids) Medication Medication: Route: Strength: Form: lidocaine 4 % topical cream topical 4% cream Class: TOPICAL LOCAL  ANESTHETICS Dose: Frequency / Time: Indication: 1 1 cream topical Number of Refills: Number of Units: 0 Generic Substitution: Start Date: End Date: One Time Use: Substitution Permitted No Note to Pharmacy: Signature(s): Date(s): Electronic Signature(s) Signed: 11/08/2017 2:43:37 PM By: Alric Quan Signed: 11/08/2017 4:23:13 PM By: Lawanda Cousins Entered By: Alric Quan on 11/08/2017 13:37:32 Adam Merritt ([id number]) --------------------------------------------------------------------------------  Problem List Details Patient Name: Adam Merritt Date of Service: 11/08/2017 1:15 PM Medical Record Number: [id number] Patient Account Number: [id number] Date of Birth/Sex: Mar 31, 1946 (72 y.o. M) Treating RN: Ahmed Prima Primary Care Provider: [name] Other Clinician: Referring Provider: [name] Treating Provider/Extender: Cathie Olden in Treatment: 3 Active Problems ICD-10 Impacting Encounter Code Description Active Date Wound Healing Diagnosis L97.222 Non-pressure chronic ulcer of left calf with fat layer exposed 10/18/2017 No Yes I89.0 Lymphedema, not elsewhere classified 10/18/2017 No Yes E66.9 Obesity, unspecified 10/18/2017 No Yes Inactive Problems Resolved Problems ICD-10 Code Description Active Date Resolved Date L97.211 Non-pressure chronic ulcer of right calf limited to breakdown of skin 10/18/2017 10/18/2017 Electronic  Signature(s) Signed: 11/08/2017 1:41:58 PM By: Lawanda Cousins Entered By: Lawanda Cousins on 11/08/2017 13:41:57 Adam Merritt ([id number]) -------------------------------------------------------------------------------- Progress Note Details Patient Name: Adam Merritt Date of Service: 11/08/2017 1:15 PM Medical Record Number: [id number] Patient Account Number: [id number] Date of Birth/Sex: [birth year] (72 y.o. M) Treating RN: Ahmed Prima Primary Care Provider: [name] Other Clinician: Referring Provider: [name] Treating Provider/Extender: Cathie Olden in Treatment: 3 Subjective Chief Complaint Information obtained from Patient he is here for evaluation of bilateral lower extremity lymphedema/venous ulcers History of Present Illness (HPI) The following HPI elements were documented for the patient's wound: Location: bilateral lower extremity Quality: no pain to ulcerations Duration: ulcerations have been present 3-4 weeks Context: ulcerations of secondary to edema, blister formation 10/18/17-He is here for initial evaluation of bilateral lower extremity ulcerations in the presence of venous insufficiency and lymphedema. He has been seen by vascular medicine in the past, Dr. Lucky Cowboy, last seen in 2016. He does have a history of abnormal ABIs, which is to be expected given his lymphedema and venous insufficiency. According to Epic, it appears that all attempts for arterial evaluation and/or angiography were not follow through with by patient. He does have a history of being seen in lymphedema clinic in 2018, stopped going approximately 6 months ago stating "it didn't do any good". He does not have lymphedema pumps, he does not have custom fit compression wrap/stockings. He is diabetic and his recent A1c last month was 7.6. He admits to chronic bilateral lower extremity pain, no change in pain since blister and ulceration development. He is currently being treated with Levaquin  for bronchitis. He has home health and we will continue. 10/25/17-He is here in follow-up evaluation for bilateral lower extremity ulcerationssubtle he remains on Levaquin for bronchitis. Right lower extremity with no evidence of drainage or ulceration, persistent left lower extremity ulceration. He states that home health has not been out since his appointment. He went to [facility] on Tuesday, studies revealed: RIGHT ABI 0.9, TBI 0.6 LEFT ABI 1.1, TBI 0.6 with triphasic flow bilaterally. We will continue his same treatment plan. He has been educated on compression therapy and need for elevation. He will benefit from lymphedema pumps 11/01/17-He is here in follow-up evaluation for left lower extremity ulcer. The right lower extremity remains healed. He has home health services, but they have not been out to see the patient for 2-3 weeks. He states  it home health physical therapy changed his dressing yesterday after therapy; he placed Ace wrap compression. We are still waiting for lymphedema pumps, reordered d/t need for company change. 11/08/17 He is here for evaluation of left lower extermity ulcer. It is improved. Edema is significantly improved with compression therapy. We will continue with same treatment plan and he will follow-up next week. No word regarding lymphedema pumps Patient History Information obtained from Patient. Family History Cancer - Paternal Grandparents, Kidney Disease - Siblings, Lung Disease - Mother, No family history of Diabetes, Heart Disease, Hereditary Spherocytosis, Hypertension, Seizures, Stroke, Thyroid Problems, Tuberculosis. Social History Never smoker, Marital Status - Married, Alcohol Use - Never, Drug Use - No History, Caffeine Use - Never. Medical History Hospitalization/Surgery History - 11/03/2013, [facility], bronchitis. Adam Merritt, Adam Merritt ([id number]) Medical And Surgical History Notes Respiratory home O2 at night as  needed Cardiovascular varicose veins Neurologic CVA - 1992 Objective Constitutional Vitals Time Taken: 1:20 PM, Height: 66 in, Weight: 298 lbs, BMI: 48.1, Temperature: 98.6 F, Pulse: 77 bpm, Respiratory Rate: 18 breaths/min, Blood Pressure: 147/75 mmHg, Pulse Oximetry: 98 %. Integumentary (Hair, Skin) Wound #11 status is Open. Original cause of wound was Blister. The wound is located on the Left,Medial Lower Leg. The wound measures 0.6cm length x 0.6cm width x 0.1cm depth; 0.283cm^2 area and 0.028cm^3 volume. There is Fat Layer (Subcutaneous Tissue) Exposed exposed. There is no tunneling or undermining noted. There is a small amount of serous drainage noted. The wound margin is flat and intact. There is large (67-100%) red granulation within the wound bed. There is a small (1-33%) amount of necrotic tissue within the wound bed including Adherent Slough. The periwound skin appearance exhibited: Hemosiderin Staining. The periwound skin appearance did not exhibit: Callus, Crepitus, Excoriation, Induration, Rash, Scarring, Dry/Scaly, Maceration, Atrophie Blanche, Cyanosis, Ecchymosis, Mottled, Pallor, Rubor, Erythema. Periwound temperature was noted as No Abnormality. The periwound has tenderness on palpation. Wound #12 status is Healed - Epithelialized. Original cause of wound was Blister. The wound is located on the Left,Posterior Lower Leg. The wound measures 0cm length x 0cm width x 0cm depth; 0cm^2 area and 0cm^3 volume. Assessment Active Problems ICD-10 Non-pressure chronic ulcer of left calf with fat layer exposed Lymphedema, not elsewhere classified Obesity, unspecified Plan Adam Merritt, Adam Merritt. ([id number]) Wound Cleansing: Wound #11 Left,Medial Lower Leg: Clean wound with Normal Saline. Cleanse wound with mild soap and water Anesthetic (add to Medication List): Wound #11 Left,Medial Lower Leg: Topical Lidocaine 4% cream applied to wound bed prior to debridement (In Clinic  Only). Skin Barriers/Peri-Wound Care: Moisturizing lotion - not on wounds on both legs Primary Wound Dressing: Wound #11 Left,Medial Lower Leg: Hydrafera Blue Ready Transfer - upon removing from wound moisten with warm soapy water Secondary Dressing: Wound #11 Left,Medial Lower Leg: ABD pad Dressing Change Frequency: Wound #11 Left,Medial Lower Leg: Dressing is to be changed Monday and Thursday. - patient comes to clinic for wound care on Thursday and [facility] needs to change dressing on Mondays Follow-up Appointments: Wound #11 Left,Medial Lower Leg: Return Appointment in 1 week. Edema Control: Wound #11 Left,Medial Lower Leg: 3 Layer Compression System - Bilateral - PLEASE MAKE SURE YOU ARE PUTTING ON A 3-LAYER WRAP SYSTEM BILATERALLY unna to anchor both legs Additional Orders / Instructions: Wound #11 Left,Medial Lower Leg: Increase protein intake. Home Health: Wound #11 Left,Medial Lower Leg: Continue Home Health Visits - Please go see pt on [place] Nurse may visit PRN to address patient s wound care needs. FACE TO  FACE ENCOUNTER: MEDICARE and MEDICAID PATIENTS: I certify that this patient is under my care and that I had a face-to-face encounter that meets the physician face-to-face encounter requirements with this patient on this date. The encounter with the patient was in whole or in part for the following MEDICAL CONDITION: (primary reason for [place]) MEDICAL NECESSITY: I certify, that based on my findings, NURSING services are a medically necessary home health service. HOME BOUND STATUS: I certify that my clinical findings support that this patient is homebound (i.e., Due to illness or injury, pt requires aid of supportive devices such as crutches, cane, wheelchairs, walkers, the use of special transportation or the assistance of another person to leave their place of residence. There is a normal inability to leave the home and doing so requires considerable  and taxing effort. Other absences are for medical reasons / religious services and are infrequent or of short duration when for other reasons). If current dressing causes regression in wound condition, may D/C ordered dressing product/s and apply Normal Saline Moist Dressing daily until next [place] / Other MD appointment. [place] of regression in wound condition at [phone]. Please direct any NON-WOUND related issues/requests for orders to patient's Primary Care Physician The following medication(s) was prescribed: lidocaine topical 4 % cream 1 1 cream topical was prescribed at facility Electronic Signature(s) Signed: 11/15/2017 2:13:31 PM By: Lawanda Cousins Previous Signature: 11/08/2017 1:43:45 PM Version By: Lawanda Cousins Entered By: Lawanda Cousins on 11/15/2017 14:13:31 Adam Merritt ([id number]) Adam Merritt, Adam Merritt ([id number]) -------------------------------------------------------------------------------- ROS/PFSH Details Patient Name: Adam Merritt Date of Service: 11/08/2017 1:15 PM Medical Record Number: [id number] Patient Account Number: [id number] Date of Birth/Sex: 03-05-1946 (72 y.o. M) Treating RN: Ahmed Prima Primary Care Provider: [name] Other Clinician: Referring Provider: [name] Treating Provider/Extender: Cathie Olden in Treatment: 3 Information Obtained From Patient Wound History Do you currently have one or more open woundso Yes How many open wounds do you currently haveo 3 Approximately how long have you had your woundso 4 weeks How have you been treating your wound(s) until nowo kerlix and ace wraps Has your wound(s) ever healed and then re-openedo No Have you had any lab work done in the past montho No Have you tested positive for an antibiotic resistant organism (MRSA, VRE)o No Have you tested positive for osteomyelitis (bone infection)o No Have you had any tests for circulation on your legso Yes Who ordered  the testo [facility] Where was the test doneo [facility] Have you had other problems associated with your woundso Swelling Eyes Medical History: Negative for: Cataracts; Glaucoma; Optic Neuritis Ear/Nose/Mouth/Throat Medical History: Negative for: Chronic sinus problems/congestion; Middle ear problems Hematologic/Lymphatic Medical History: Positive for: Anemia - aplastic anemia; Lymphedema Negative for: Hemophilia; Human Immunodeficiency Virus; Sickle Cell Disease Respiratory Medical History: Positive for: Chronic Obstructive Pulmonary Disease (COPD); Sleep Apnea Negative for: Aspiration; Asthma; Pneumothorax; Tuberculosis Past Medical History Notes: home O2 at night as needed Cardiovascular Medical History: Positive for: Congestive Heart Failure; Coronary Artery Disease; Hypertension; Peripheral Venous Disease Negative for: Angina; Arrhythmia; Deep Vein Thrombosis; Hypotension; Myocardial Infarction; Peripheral Arterial Disease; Phlebitis; Vasculitis Past Medical History Notes: varicose veins Adam Merritt, Adam Merritt ([id number]) Gastrointestinal Medical History: Negative for: Cirrhosis ; Colitis; Crohnos; Hepatitis A; Hepatitis B; Hepatitis C Endocrine Medical History: Positive for: Type II Diabetes Negative for: Type I Diabetes Time with diabetes: since 2006 Treated with: Insulin Blood sugar tested every day: Yes Tested : Blood sugar testing results: Breakfast: 209 Genitourinary  Medical History: Negative for: End Stage Renal Disease Immunological Medical History: Negative for: Lupus Erythematosus; Raynaudos; Scleroderma Integumentary (Skin) Medical History: Negative for: History of Burn; History of pressure wounds Musculoskeletal Medical History: Positive for: Osteoarthritis Negative for: Gout; Rheumatoid Arthritis; Osteomyelitis Neurologic Medical History: Positive for: Neuropathy Negative for: Dementia; Quadriplegia; Paraplegia; Seizure Disorder Past Medical History  Notes: CVA - 1992 Oncologic Medical History: Positive for: Received Chemotherapy - last dose 2006 Negative for: Received Radiation Psychiatric Medical History: Negative for: Anorexia/bulimia; Confinement Anxiety Immunizations Pneumococcal Vaccine: Adam Merritt, Adam Merritt ([id number]) Received Pneumococcal Vaccination: Yes Implantable Devices Hospitalization / Surgery History Name of Hospital Purpose of Hospitalization/Surgery Date [facility] bronchitis 11/03/2013 Family and Social History Cancer: Yes - Paternal Grandparents; Diabetes: No; Heart Disease: No; Hereditary Spherocytosis: No; Hypertension: No; Kidney Disease: Yes - Siblings; Lung Disease: Yes - Mother; Seizures: No; Stroke: No; Thyroid Problems: No; Tuberculosis: No; Never smoker; Marital Status - Married; Alcohol Use: Never; Drug Use: No History; Caffeine Use: Never; Financial Concerns: No; Food, Clothing or Shelter Needs: No; Support System Lacking: No; Transportation Concerns: No; Advanced Directives: No; Patient does not want information on Advanced Directives Physician Affirmation I have reviewed and agree with the above information. Electronic Signature(s) Signed: 11/08/2017 2:43:37 PM By: Alric Quan Signed: 11/08/2017 4:23:13 PM By: Lawanda Cousins Entered By: Lawanda Cousins on 11/08/2017 13:43:30 Adam Merritt ([id number]) -------------------------------------------------------------------------------- [place] Details Patient Name: Adam Merritt Date of Service: 11/08/2017 Medical Record Number: [id number] Patient Account Number: [id number] Date of Birth/Sex: 02-Jul-1945 (72 y.o. M) Treating RN: Ahmed Prima Primary Care Provider: [name] Other Clinician: Referring Provider: [name] Treating Provider/Extender: Cathie Olden in Treatment: 3 Diagnosis Coding ICD-10 Codes Code Description [id number] Non-pressure chronic ulcer of left calf with fat layer exposed I89.0 Lymphedema, not elsewhere classified E66.9  Obesity, unspecified Facility Procedures CPT4: Description Modifier Quantity Code [id number] BILATERAL: Application of multi-layer venous compression system; leg (below 1 knee), including ankle and foot. Physician Procedures CPT4 Code: 5462703 Description: 50093 - WC PHYS LEVEL 3 - EST PT ICD-10 Diagnosis Description L97.222 Non-pressure chronic ulcer of left calf with fat layer expo I89.0 Lymphedema, not elsewhere classified E66.9 Obesity, unspecified Modifier: sed Quantity: 1 Electronic Signature(s) Signed: 11/08/2017 1:45:13 PM By: Alric Quan Signed: 11/08/2017 4:23:13 PM By: Lawanda Cousins Previous Signature: 11/08/2017 1:44:22 PM Version By: Lawanda Cousins Entered By: Alric Quan on 11/08/2017 13:45:12

## 2017-11-13 ENCOUNTER — Ambulatory Visit: Payer: Self-pay | Admitting: Internal Medicine

## 2017-11-15 ENCOUNTER — Encounter: Payer: Medicare Other | Admitting: Nurse Practitioner

## 2017-11-15 DIAGNOSIS — E11622 Type 2 diabetes mellitus with other skin ulcer: Secondary | ICD-10-CM | POA: Diagnosis not present

## 2017-11-18 ENCOUNTER — Other Ambulatory Visit: Payer: Self-pay | Admitting: Internal Medicine

## 2017-11-18 NOTE — Progress Notes (Addendum)
ABE, [employer] ([id number]) Visit Report for 11/15/2017 Chief Complaint Document Details Patient Name: Adam Merritt, Adam Merritt Date of Service: 11/15/2017 1:15 PM Medical Record Number: [id number] Patient Account Number: [id number] Date of Birth/Sex: 26-Oct-1945 (72 y.o. M) Treating RN: Ahmed Prima Primary Care Provider: [name] Other Clinician: Referring Provider: [name] Treating Provider/Extender: Cathie Olden in Treatment: 4 Information Obtained from: Patient Chief Complaint he is here for evaluation of bilateral lower extremity lymphedema/venous ulcers Electronic Signature(s) Signed: 11/15/2017 1:56:17 PM By: Lawanda Cousins Entered By: Lawanda Cousins on 11/15/2017 13:56:17 Adam Merritt ([id number]) -------------------------------------------------------------------------------- HPI Details Patient Name: Adam Merritt Date of Service: 11/15/2017 1:15 PM Medical Record Number: [id number] Patient Account Number: [id number] Date of Birth/Sex: 04/03/1946 (72 y.o. M) Treating RN: Ahmed Prima Primary Care Provider: [name] Other Clinician: Referring Provider: [name] Treating Provider/Extender: Cathie Olden in Treatment: 4 History of Present Illness Location: bilateral lower extremity Quality: no pain to ulcerations Duration: ulcerations have been present 3-4 weeks Context: ulcerations of secondary to edema, blister formation HPI Description: 10/18/17-He is here for initial evaluation of bilateral lower extremity ulcerations in the presence of venous insufficiency and lymphedema. He has been seen by vascular medicine in the past, Dr. Lucky Cowboy, last seen in 2016. He does have a history of abnormal ABIs, which is to be expected given his lymphedema and venous insufficiency. According to Epic, it appears that all attempts for arterial evaluation and/or angiography were not follow through with by patient. He does have a history of being seen in lymphedema clinic in  2018, stopped going approximately 6 months ago stating "it didn't do any good". He does not have lymphedema pumps, he does not have custom fit compression wrap/stockings. He is diabetic and his recent A1c last month was 7.6. He admits to chronic bilateral lower extremity pain, no change in pain since blister and ulceration development. He is currently being treated with Levaquin for bronchitis. He has home health and we will continue. 10/25/17-He is here in follow-up evaluation for bilateral lower extremity ulcerationssubtle he remains on Levaquin for bronchitis. Right lower extremity with no evidence of drainage or ulceration, persistent left lower extremity ulceration. He states that home health has not been out since his appointment. He went to [facility] on Tuesday, studies revealed: RIGHT ABI 0.9, TBI 0.6 LEFT ABI 1.1, TBI 0.6 with triphasic flow bilaterally. We will continue his same treatment plan. He has been educated on compression therapy and need for elevation. He will benefit from lymphedema pumps 11/01/17-He is here in follow-up evaluation for left lower extremity ulcer. The right lower extremity remains healed. He has home health services, but they have not been out to see the patient for 2-3 weeks. He states it home health physical therapy changed his dressing yesterday after therapy; he placed Ace wrap compression. We are still waiting for lymphedema pumps, reordered d/t need for company change. 11/08/17-He is here in follow-up evaluation for left lower extremity ulcer. It is improved. Edema is significantly improved with compression therapy. We will continue with same treatment plan and he will follow-up next week. No word regarding lymphedema pumps 11/15/17-He is here in follow-up evaluation for left lower extremity ulcer. He is healed and will be discharged from wound care services. I have reached out to [facility] regarding his lymphedema pumps. They have been  unable to reach the patient; the contact number they had with the patient's wife's cell phone and she has not answered any unrecognized calls. Contact should  be made today, trial planned for next week; [facility] will continue to follow Electronic Signature(s) Signed: 11/15/2017 2:07:06 PM By: Lawanda Cousins Entered By: Lawanda Cousins on 11/15/2017 14:07:06 Adam Merritt ([id number]) -------------------------------------------------------------------------------- Physician Orders Details Patient Name: Adam Merritt Date of Service: 11/15/2017 1:15 PM Medical Record Number: [id number] Patient Account Number: [id number] Date of Birth/Sex: Jun 22, 1945 (72 y.o. M) Treating RN: Ahmed Prima Primary Care Provider: [name] Other Clinician: Referring Provider: [name] Treating Provider/Extender: Cathie Olden in Treatment: 4 Verbal / Phone Orders: Yes Clinician: [place], Debi Read Back and Verified: Yes Diagnosis Coding Edema Control o 3 Layer Compression System - Bilateral - unna to anchor o Other: - HHRN to go to patients house Thursday 11/22/17 and cut his 3 -layer wraps off and do not put back on. Continue to go back to orders he had before he came back here to the [place]. Compression pumps have been ordered for pt. [place] Visits o Home Health Nurse may visit PRN to address patientos wound care needs. o FACE TO FACE ENCOUNTER: MEDICARE and MEDICAID PATIENTS: I certify that this patient is under my care and that I had a face-to-face encounter that meets the physician face-to-face encounter requirements with this patient on this date. The encounter with the patient was in whole or in part for the following MEDICAL CONDITION: (primary reason for [place]) MEDICAL NECESSITY: I certify, that based on my findings, NURSING services are a medically necessary home health service. HOME BOUND STATUS: I certify that  my clinical findings support that this patient is homebound (i.e., Due to illness or injury, pt requires aid of supportive devices such as crutches, cane, wheelchairs, walkers, the use of special transportation or the assistance of another person to leave their place of residence. There is a normal inability to leave the home and doing so requires considerable and taxing effort. Other absences are for medical reasons / religious services and are infrequent or of short duration when for other reasons). o If current dressing causes regression in wound condition, may D/C ordered dressing product/s and apply Normal Saline Moist Dressing daily until next [place] / Other MD appointment. Hand of regression in wound condition at [phone]. o Please direct any NON-WOUND related issues/requests for orders to patient's Primary Care Physician Discharge From [name] Services o Discharge from [place] has completed with wound care center Electronic Signature(s) Signed: 11/15/2017 3:04:13 PM By: Alric Quan Signed: 11/15/2017 3:35:38 PM By: Lawanda Cousins Entered By: Alric Quan on 11/15/2017 14:10:00 Adam Merritt ([id number]) -------------------------------------------------------------------------------- Problem List Details Patient Name: Adam Merritt Date of Service: 11/15/2017 1:15 PM Medical Record Number: [id number] Patient Account Number: [id number] Date of Birth/Sex: 1945-06-24 (72 y.o. M) Treating RN: Ahmed Prima Primary Care Provider: [name] Other Clinician: Referring Provider: [name] Treating Provider/Extender: Cathie Olden in Treatment: 4 Active Problems ICD-10 Impacting Encounter Code Description Active Date Wound Healing Diagnosis L97.222 Non-pressure chronic ulcer of left calf with fat layer exposed 10/18/2017 No Yes I89.0 Lymphedema, not elsewhere classified 10/18/2017 No Yes E66.9 Obesity,  unspecified 10/18/2017 No Yes Inactive Problems Resolved Problems ICD-10 Code Description Active Date Resolved Date L97.211 Non-pressure chronic ulcer of right calf limited to breakdown of skin 10/18/2017 10/18/2017 Electronic Signature(s) Signed: 11/15/2017 1:56:00 PM By: Lawanda Cousins Entered By: Lawanda Cousins on 11/15/2017 13:55:59 Adam Merritt ([id number]) -------------------------------------------------------------------------------- Progress Note Details Patient Name: Adam Merritt Date of Service:  11/15/2017 1:15 PM Medical Record Number: [id number] Patient Account Number: [id number] Date of Birth/Sex: Jul 31, 1945 (72 y.o. M) Treating RN: Ahmed Prima Primary Care Provider: [name] Other Clinician: Referring Provider: [name] Treating Provider/Extender: Cathie Olden in Treatment: 4 Subjective Chief Complaint Information obtained from Patient he is here for evaluation of bilateral lower extremity lymphedema/venous ulcers History of Present Illness (HPI) The following HPI elements were documented for the patient's wound: Location: bilateral lower extremity Quality: no pain to ulcerations Duration: ulcerations have been present 3-4 weeks Context: ulcerations of secondary to edema, blister formation 10/18/17-He is here for initial evaluation of bilateral lower extremity ulcerations in the presence of venous insufficiency and lymphedema. He has been seen by vascular medicine in the past, Dr. Lucky Cowboy, last seen in 2016. He does have a history of abnormal ABIs, which is to be expected given his lymphedema and venous insufficiency. According to Epic, it appears that all attempts for arterial evaluation and/or angiography were not follow through with by patient. He does have a history of being seen in lymphedema clinic in 2018, stopped going approximately 6 months ago stating "it didn't do any good". He does not have lymphedema pumps, he does not have custom fit  compression wrap/stockings. He is diabetic and his recent A1c last month was 7.6. He admits to chronic bilateral lower extremity pain, no change in pain since blister and ulceration development. He is currently being treated with Levaquin for bronchitis. He has home health and we will continue. 10/25/17-He is here in follow-up evaluation for bilateral lower extremity ulcerationssubtle he remains on Levaquin for bronchitis. Right lower extremity with no evidence of drainage or ulceration, persistent left lower extremity ulceration. He states that home health has not been out since his appointment. He went to [facility] on Tuesday, studies revealed: RIGHT ABI 0.9, TBI 0.6 LEFT ABI 1.1, TBI 0.6 with triphasic flow bilaterally. We will continue his same treatment plan. He has been educated on compression therapy and need for elevation. He will benefit from lymphedema pumps 11/01/17-He is here in follow-up evaluation for left lower extremity ulcer. The right lower extremity remains healed. He has home health services, but they have not been out to see the patient for 2-3 weeks. He states it home health physical therapy changed his dressing yesterday after therapy; he placed Ace wrap compression. We are still waiting for lymphedema pumps, reordered d/t need for company change. 11/08/17-He is here in follow-up evaluation for left lower extremity ulcer. It is improved. Edema is significantly improved with compression therapy. We will continue with same treatment plan and he will follow-up next week. No word regarding lymphedema pumps 11/15/17-He is here in follow-up evaluation for left lower extremity ulcer. He is healed and will be discharged from wound care services. I have reached out to [facility] regarding his lymphedema pumps. They have been unable to reach the patient; the contact number they had with the patient's wife's cell phone and she has not answered any unrecognized  calls. Contact should be made today, trial planned for next week; [facility] will continue to follow Wound History Patient reportedly has not tested positive for osteomyelitis. Patient reportedly has had testing performed to evaluate circulation in the legs. Patient experiences the following problems associated with their wounds: swelling. Patient History Information obtained from Patient. Family History Cancer - Paternal Grandparents, Kidney Disease - Siblings, Lung Disease - Mother, Adam Merritt, Adam Merritt ([id number]) No family history of Diabetes, Heart Disease, Hereditary Spherocytosis, Hypertension, Seizures, Stroke,  Thyroid Problems, Tuberculosis. Social History Never smoker, Marital Status - Married, Alcohol Use - Never, Drug Use - No History, Caffeine Use - Never. Medical History Hospitalization/Surgery History - 11/03/2013, [facility], bronchitis. Medical And Surgical History Notes Respiratory home O2 at night as needed Cardiovascular varicose veins Neurologic CVA - 1992 Objective Constitutional Vitals Time Taken: 1:17 PM, Height: 66 in, Weight: 298 lbs, BMI: 48.1, Temperature: 97.6 F, Pulse: 76 bpm, Respiratory Rate: 18 breaths/min, Blood Pressure: 133/68 mmHg. Integumentary (Hair, Skin) Wound #11 status is Healed - Epithelialized. Original cause of wound was Blister. The wound is located on the Left,Medial Lower Leg. The wound measures 0cm length x 0cm width x 0cm depth; 0cm^2 area and 0cm^3 volume. Assessment Active Problems ICD-10 Non-pressure chronic ulcer of left calf with fat layer exposed Lymphedema, not elsewhere classified Obesity, unspecified Plan Edema Control: Adam Merritt, Adam Merritt ([id number]) 3 Layer Compression System - Bilateral - unna to anchor Other: - HHRN to go to patients house Thursday 11/22/17 and cut his 3 -layer wraps off and do not put back on. Continue to go back to orders he had before he came back here to the [place]. Compression pumps have  been ordered for pt. Home Health: [place] Nurse may visit PRN to address patient s wound care needs. FACE TO FACE ENCOUNTER: MEDICARE and MEDICAID PATIENTS: I certify that this patient is under my care and that I had a face-to-face encounter that meets the physician face-to-face encounter requirements with this patient on this date. The encounter with the patient was in whole or in part for the following MEDICAL CONDITION: (primary reason for [place]) MEDICAL NECESSITY: I certify, that based on my findings, NURSING services are a medically necessary home health service. HOME BOUND STATUS: I certify that my clinical findings support that this patient is homebound (i.e., Due to illness or injury, pt requires aid of supportive devices such as crutches, cane, wheelchairs, walkers, the use of special transportation or the assistance of another person to leave their place of residence. There is a normal inability to leave the home and doing so requires considerable and taxing effort. Other absences are for medical reasons / religious services and are infrequent or of short duration when for other reasons). If current dressing causes regression in wound condition, may D/C ordered dressing product/s and apply Normal Saline Moist Dressing daily until next [place] / Other MD appointment. [place] of regression in wound condition at [phone]. Please direct any NON-WOUND related issues/requests for orders to patient's Primary Care Physician Discharge From [facility] Services: Discharge from [place] has completed with wound care center Electronic Signature(s) Signed: 11/18/2017 9:08:00 PM By: Lawanda Cousins Previous Signature: 11/15/2017 2:07:40 PM Version By: Lawanda Cousins Entered By: Lawanda Cousins on 11/18/2017 21:08:00 Adam Merritt  ([id number]) -------------------------------------------------------------------------------- ROS/PFSH Details Patient Name: Adam Merritt Date of Service: 11/15/2017 1:15 PM Medical Record Number: [id number] Patient Account Number: [id number] Date of Birth/Sex: 1945-08-19 (72 y.o. M) Treating RN: Ahmed Prima Primary Care Provider: [name] Other Clinician: Referring Provider: [name] Treating Provider/Extender: Cathie Olden in Treatment: 4 Information Obtained From Patient Wound History Do you currently have one or more open woundso No Have you tested positive for osteomyelitis (bone infection)o No Have you had any tests for circulation on your legso Yes Who ordered the testo [facility] Where was the test doneo [facility] Have you had other problems associated with your woundso Swelling Eyes  Medical History: Negative for: Cataracts; Glaucoma; Optic Neuritis Ear/Nose/Mouth/Throat Medical History: Negative for: Chronic sinus problems/congestion; Middle ear problems Hematologic/Lymphatic Medical History: Positive for: Anemia - aplastic anemia; Lymphedema Negative for: Hemophilia; Human Immunodeficiency Virus; Sickle Cell Disease Respiratory Medical History: Positive for: Chronic Obstructive Pulmonary Disease (COPD); Sleep Apnea Negative for: Aspiration; Asthma; Pneumothorax; Tuberculosis Past Medical History Notes: home O2 at night as needed Cardiovascular Medical History: Positive for: Congestive Heart Failure; Coronary Artery Disease; Hypertension; Peripheral Venous Disease Negative for: Angina; Arrhythmia; Deep Vein Thrombosis; Hypotension; Myocardial Infarction; Peripheral Arterial Disease; Phlebitis; Vasculitis Past Medical History Notes: varicose veins Gastrointestinal Medical History: Negative for: Cirrhosis ; Colitis; Crohnos; Hepatitis A; Hepatitis B; Hepatitis C Endocrine Adam Merritt, Adam Merritt ([id number]) Medical History: Positive for: Type II  Diabetes Negative for: Type I Diabetes Time with diabetes: since 2006 Treated with: Insulin Blood sugar tested every day: Yes Tested : Blood sugar testing results: Breakfast: 209 Genitourinary Medical History: Negative for: End Stage Renal Disease Immunological Medical History: Negative for: Lupus Erythematosus; Raynaudos; Scleroderma Integumentary (Skin) Medical History: Negative for: History of Burn; History of pressure wounds Musculoskeletal Medical History: Positive for: Osteoarthritis Negative for: Gout; Rheumatoid Arthritis; Osteomyelitis Neurologic Medical History: Positive for: Neuropathy Negative for: Dementia; Quadriplegia; Paraplegia; Seizure Disorder Past Medical History Notes: CVA - 1992 Oncologic Medical History: Positive for: Received Chemotherapy - last dose 2006 Negative for: Received Radiation Psychiatric Medical History: Negative for: Anorexia/bulimia; Confinement Anxiety Immunizations Pneumococcal Vaccine: Received Pneumococcal Vaccination: Yes Implantable Devices Hospitalization / Surgery History Name of Hospital Purpose of Hospitalization/Surgery Date [facility] bronchitis 11/03/2013 Adam Merritt, Adam Merritt ([id number]) Family and Social History Cancer: Yes - Paternal Grandparents; Diabetes: No; Heart Disease: No; Hereditary Spherocytosis: No; Hypertension: No; Kidney Disease: Yes - Siblings; Lung Disease: Yes - Mother; Seizures: No; Stroke: No; Thyroid Problems: No; Tuberculosis: No; Never smoker; Marital Status - Married; Alcohol Use: Never; Drug Use: No History; Caffeine Use: Never; Financial Concerns: No; Food, Clothing or Shelter Needs: No; Support System Lacking: No; Transportation Concerns: No; Advanced Directives: No; Patient does not want information on Advanced Directives Physician Affirmation I have reviewed and agree with the above information. Electronic Signature(s) Signed: 11/15/2017 3:04:13 PM By: Alric Quan Signed: 11/15/2017 3:35:38 PM  By: Lawanda Cousins Entered By: Lawanda Cousins on 11/15/2017 14:07:24 Adam Merritt ([id number]) -------------------------------------------------------------------------------- [employer] Details Patient Name: Adam Merritt Date of Service: 11/15/2017 Medical Record Number: [id number] Patient Account Number: [id number] Date of Birth/Sex: 16-Nov-1945 (72 y.o. M) Treating RN: Ahmed Prima Primary Care Provider: [name] Other Clinician: Referring Provider: [name] Treating Provider/Extender: Cathie Olden in Treatment: 4 Diagnosis Coding ICD-10 Codes Code Description [phone] Non-pressure chronic ulcer of left calf with fat layer exposed I89.0 Lymphedema, not elsewhere classified E66.9 Obesity, unspecified Facility Procedures CPT4: Description Modifier Quantity Code [phone] [id number] BILATERAL: Application of multi-layer venous compression system; leg (below 1 knee), including ankle and foot. Physician Procedures CPT4 Code: [id number] Description: [phone] - WC PHYS LEVEL 2 - EST PT ICD-10 Diagnosis Description L97.222 Non-pressure chronic ulcer of left calf with fat layer expo I89.0 Lymphedema, not elsewhere classified Modifier: sed Quantity: 1 Electronic Signature(s) Signed: 11/15/2017 2:08:19 PM By: Alric Quan Signed: 11/15/2017 3:35:38 PM By: Lawanda Cousins Previous Signature: 11/15/2017 2:07:55 PM Version By: Lawanda Cousins Entered By: Alric Quan on 11/15/2017 14:08:18

## 2017-11-18 NOTE — Progress Notes (Signed)
RAYNELL, Adam Merritt ([id number]) Visit Report for 11/15/2017 Arrival Information Details Patient Name: Adam Merritt, Adam Merritt Date of Service: 11/15/2017 1:15 PM Medical Record Number: [id number] Patient Account Number: [id number] Date of Birth/Sex: 1945/07/12 (72 y.o. M) Treating RN: Montey Hora Primary Care Miri Jose: [name] Other Clinician: Referring Antowan Samford: [name] Treating Ellajane Stong/Extender: Cathie Olden in Treatment: 4 Visit Information History Since Last Visit Added or deleted any medications: No Patient Arrived: Cane Any new allergies or adverse reactions: No Arrival Time: 13:14 Had a fall or experienced change in No Accompanied By: spouse activities of daily living that may affect Transfer Assistance: None risk of falls: Patient Identification Verified: Yes Signs or symptoms of abuse/neglect since last visito No Secondary Verification Process Completed: Yes Hospitalized since last visit: No Patient Has Alerts: Yes Implantable device outside of the clinic excluding No Patient Alerts: DMII cellular tissue based products placed in the center since last visit: Has Dressing in Place as Prescribed: Yes Has Compression in Place as Prescribed: No Pain Present Now: No Notes patient removed wraps this morning to shower Electronic Signature(s) Signed: 11/15/2017 3:12:42 PM By: Montey Hora Entered By: Montey Hora on 11/15/2017 13:16:09 Adam Merritt ([id number]) -------------------------------------------------------------------------------- Encounter Discharge Information Details Patient Name: Adam Merritt Date of Service: 11/15/2017 1:15 PM Medical Record Number: [id number] Patient Account Number: [id number] Date of Birth/Sex: 19-Jun-1945 (72 y.o. M) Treating RN: Ahmed Prima Primary Care Jazzmyne Rasnick: [name] Other Clinician: Referring Monica Codd: [name] Treating Tyresa Prindiville/Extender: Cathie Olden in Treatment: 4 Encounter Discharge Information  Items Discharge Condition: Stable Ambulatory Status: Cane Discharge Destination: Home Transportation: Private Auto Accompanied By: wife Schedule Follow-up Appointment: No Clinical Summary of Care: Electronic Signature(s) Signed: 11/15/2017 1:56:44 PM By: Alric Quan Entered By: Alric Quan on 11/15/2017 13:56:44 Adam Merritt ([id number]) -------------------------------------------------------------------------------- Lower Extremity Assessment Details Patient Name: Adam Merritt Date of Service: 11/15/2017 1:15 PM Medical Record Number: [id number] Patient Account Number: [id number] Date of Birth/Sex: 11/01/1945 (72 y.o. M) Treating RN: Montey Hora Primary Care Jamerson Vonbargen: [name] Other Clinician: Referring Teena Mangus: [name] Treating Tiffanie Blassingame/Extender: Cathie Olden in Treatment: 4 Edema Assessment Assessed: [Left: No] [Right: No] [Left: Edema] [Right: :] Calf Left: Right: Point of Measurement: 32 cm From Medial Instep 53.2 cm 49.2 cm Ankle Left: Right: Point of Measurement: 11 cm From Medial Instep 37.6 cm 31.2 cm Vascular Assessment Pulses: Dorsalis Pedis Palpable: [Left:Yes] [Right:Yes] Posterior Tibial Extremity colors, hair growth, and conditions: Extremity Color: [Left:Hyperpigmented] [Right:Hyperpigmented] Hair Growth on Extremity: [Left:No] [Right:No] Temperature of Extremity: [Left:Warm] [Right:Warm] Capillary Refill: [Left:< 3 seconds] [Right:< 3 seconds] Toe Nail Assessment Left: Right: Thick: Yes Yes Discolored: Yes Yes Deformed: No No Improper Length and Hygiene: Yes Yes Electronic Signature(s) Signed: 11/15/2017 3:12:42 PM By: Montey Hora Entered By: Montey Hora on 11/15/2017 13:23:12 Adam Merritt ([id number]) -------------------------------------------------------------------------------- Multi Wound Chart Details Patient Name: Adam Merritt Date of Service: 11/15/2017 1:15 PM Medical Record Number:  [id number] Patient Account Number: [id number] Date of Birth/Sex: 01/14/1946 (72 y.o. 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 N/A N/A Date Acquired: 10/01/2017 N/A N/A Weeks of Treatment: 4 N/A N/A Wound  Status: Healed - Epithelialized N/A N/A Measurements L x W x D 0x0x0 N/A N/A (cm) Area (cm) : 0 N/A N/A Volume (cm) : 0 N/A N/A % Reduction in Area: 100.00% N/A N/A % Reduction in Volume: 100.00% N/A N/A Classification: Grade 1 N/A N/A Periwound Skin Texture: No Abnormalities Noted N/A N/A Periwound Skin Moisture: No Abnormalities Noted N/A N/A Periwound Skin Color: No Abnormalities Noted N/A N/A Tenderness on Palpation: No N/A N/A Treatment Notes Electronic Signature(s) Signed: 11/15/2017 1:56:06 PM By: Lawanda Cousins Entered By: Lawanda Cousins on 11/15/2017 13:56:06 Adam Merritt ([id number]) -------------------------------------------------------------------------------- [place] Details Patient Name: Adam Merritt Date of Service: 11/15/2017 1:15 PM Medical Record Number: [id number] Patient Account Number: [id number] Date of Birth/Sex: 18-Jul-1945 (72 y.o. M) Treating RN: Ahmed Prima Primary Care Jaxden Blyden: [name] Other Clinician: Referring Janesa Dockery: [name] Treating Britta Louth/Extender: Cathie Olden in Treatment: 4 Active Inactive Electronic Signature(s) Signed: 11/15/2017 1:55:48 PM By: Alric Quan Entered By:  Alric Quan on 11/15/2017 13:55:47 Adam Merritt ([id number]) -------------------------------------------------------------------------------- Pain Assessment Details Patient Name: Adam Merritt Date of Service: 11/15/2017 1:15 PM Medical Record Number: [id number] Patient Account Number: [id number] Date of Birth/Sex: 24-Sep-1945 (72 y.o. M) Treating RN: Montey Hora Primary Care Willam Munford: [name] Other Clinician: Referring Jonica Bickhart: [name] Treating Abrar Bilton/Extender: Cathie Olden in Treatment: 4 Active Problems Location of Pain Severity and Description of Pain Patient Has Paino No Site Locations Pain Management and Medication Current Pain Management: Electronic Signature(s) Signed: 11/15/2017 3:12:42 PM By: Montey Hora Entered By: Montey Hora on 11/15/2017 13:16:18 Adam Merritt ([id number]) -------------------------------------------------------------------------------- Patient/Caregiver Education Details Patient Name: Adam Merritt Date of Service: 11/15/2017 1:15 PM Medical Record Number: [id number] Patient Account Number: [id number] Date of Birth/Gender: 01-23-1946 (72 y.o. M) Treating RN: Ahmed Prima Primary Care Physician: [name] Other Clinician: Referring Physician: [name] Treating Physician/Extender: Cathie Olden in Treatment: 4 Education Assessment Education Provided To: Patient Education Topics Provided Wound/Skin Impairment: Handouts: Other: follow up about getting your compression pumps Methods: Explain/Verbal Responses: State content correctly Electronic Signature(s) Signed: 11/15/2017 3:04:13 PM By: Alric Quan Entered By: Alric Quan on 11/15/2017 14:09:09 Adam Merritt ([id number]) -------------------------------------------------------------------------------- Wound Assessment Details Patient Name: Adam Merritt Date of Service: 11/15/2017 1:15 PM Medical Record Number: [id number] Patient  Account Number: [id number] Date of Birth/Sex: Dec 10, 1945 (72 y.o. M) Treating RN: Montey Hora Primary Care Channon Brougher: [name] Other Clinician: Referring Alanie Syler: [name] Treating Jamaiyah Pyle/Extender: Cathie Olden in Treatment: 4 Wound Status Wound Number: 11 Primary Etiology: Diabetic Wound/Ulcer of the Lower Extremity Wound Location: Left, Medial Lower Leg Secondary Venous Leg Ulcer Wounding Event: Blister Etiology: Date Acquired: 10/01/2017 Wound Status: Healed - Epithelialized Weeks Of Treatment: 4 Clustered Wound: No Photos Photo Uploaded By: Montey Hora on 11/15/2017 13:27:16 Wound Measurements Length: (cm) 0 % Width: (cm) 0 % Depth: (cm) 0 Area: (cm) 0 Volume: (cm) 0 Reduction in Area: 100% Reduction in Volume: 100% Wound Description Classification: Grade 1 Periwound Skin Texture Texture Color No Abnormalities Noted: No No Abnormalities Noted: No Moisture No Abnormalities Noted: No Electronic Signature(s) Signed: 11/15/2017 3:12:42 PM By: Montey Hora Entered By: Montey Hora on 11/15/2017 13:20:06 Adam Merritt ([id number]) -------------------------------------------------------------------------------- [place] Details Patient Name: Adam Merritt Date of Service: 11/15/2017 1:15 PM Medical Record Number: [id number] Patient Account Number: [id number] Date of Birth/Sex: 1945-07-02 (72 y.o. M) Treating RN: Montey Hora Primary Care Hayes Czaja: [name] Other Clinician: Referring Edrei Norgaard: [name] Treating Breindel Collier/Extender: Cathie Olden in Treatment: 4 Vital Signs  Time Taken: 13:17 Temperature (F): 97.6 Height (in): 66 Pulse (bpm): 76 Weight (lbs): 298 Respiratory Rate (breaths/min): 18 Body Mass Index (BMI): 48.1 Blood Pressure (mmHg): 133/68 Reference Range: 80 - 120 mg / dl Electronic Signature(s) Signed: 11/15/2017 3:12:42 PM By: Montey Hora Entered By: Montey Hora on 11/15/2017 13:18:32

## 2017-11-27 ENCOUNTER — Other Ambulatory Visit
Admission: RE | Admit: 2017-11-27 | Discharge: 2017-11-27 | Disposition: A | Discharge status: Home / Self Care | Payer: Medicare Other | Source: Ambulatory Visit | Attending: Internal Medicine | Admitting: Internal Medicine

## 2017-11-27 ENCOUNTER — Encounter: Payer: Self-pay | Admitting: Internal Medicine

## 2017-11-27 ENCOUNTER — Ambulatory Visit (INDEPENDENT_AMBULATORY_CARE_PROVIDER_SITE_OTHER): Payer: Medicare Other | Admitting: Internal Medicine

## 2017-11-27 ENCOUNTER — Encounter: Payer: Medicare Other | Admitting: Physician Assistant

## 2017-11-27 VITALS — BP 164/90 | HR 66 | Resp 16 | Ht 66.0 in | Wt 296.8 lb

## 2017-11-27 DIAGNOSIS — N183 Chronic kidney disease, stage 3 (moderate): Secondary | ICD-10-CM | POA: Insufficient documentation

## 2017-11-27 DIAGNOSIS — E11622 Type 2 diabetes mellitus with other skin ulcer: Secondary | ICD-10-CM | POA: Diagnosis not present

## 2017-11-27 DIAGNOSIS — G894 Chronic pain syndrome: Secondary | ICD-10-CM

## 2017-11-27 DIAGNOSIS — Z79899 Other long term (current) drug therapy: Secondary | ICD-10-CM | POA: Diagnosis not present

## 2017-11-27 DIAGNOSIS — I890 Lymphedema, not elsewhere classified: Secondary | ICD-10-CM | POA: Diagnosis not present

## 2017-11-27 DIAGNOSIS — E1165 Type 2 diabetes mellitus with hyperglycemia: Secondary | ICD-10-CM

## 2017-11-27 LAB — POCT URINE DRUG SCREEN
POC Amphetamine UR: NOT DETECTED
POC BENZODIAZEPINES UR: NOT DETECTED
POC Barbiturate UR: NOT DETECTED
POC Cocaine UR: NOT DETECTED
POC Ecstasy UR: NOT DETECTED
POC METHADONE UR: NOT DETECTED
POC Marijuana UR: NOT DETECTED
POC Methamphetamine UR: NOT DETECTED
POC OPIATE UR: POSITIVE — AB
POC Oxycodone UR: POSITIVE — AB
POC PHENCYCLIDINE UR: NOT DETECTED
POC TRICYCLICS UR: NOT DETECTED

## 2017-11-27 LAB — BASIC METABOLIC PANEL
Anion gap: 9 (ref 5–15)
BUN: 15 mg/dL (ref 8–23)
CALCIUM: 8.8 mg/dL — AB (ref 8.9–10.3)
CO2: 27 mmol/L (ref 22–32)
CREATININE: 1.41 mg/dL — AB (ref 0.61–1.24)
Chloride: 101 mmol/L (ref 98–111)
GFR calc Af Amer: 56 mL/min — ABNORMAL LOW (ref >60)
GFR, EST NON AFRICAN AMERICAN: 48 mL/min — AB (ref >60)
Glucose, Bld: 133 mg/dL — ABNORMAL HIGH (ref 70–99)
POTASSIUM: 3.6 mmol/L (ref 3.5–5.1)
SODIUM: 137 mmol/L (ref 135–145)

## 2017-11-27 MED ORDER — FENTANYL 50 MCG/HR TD PT72
50.0000 ug | MEDICATED_PATCH | TRANSDERMAL | 0 refills | Status: DC
Start: 2017-11-27 — End: 2017-12-31

## 2017-11-27 MED ORDER — METOLAZONE 2.5 MG PO TABS: 2.5000 mg | ORAL_TABLET | Freq: Every day | ORAL | 3 refills | Status: DC

## 2017-11-27 MED ORDER — INDAPAMIDE 2.5 MG PO TABS: 2.5000 mg | ORAL_TABLET | Freq: Every day | ORAL | 3 refills | Status: DC

## 2017-11-27 NOTE — Progress Notes (Signed)
[facility] [place], [place] [address]  Internal MEDICINE  Office Visit Note  Patient Name: Adam Merritt  [id number]  [id number]  Date of Service: 12/12/2017  Chief Complaint  Patient presents with  . Diabetes  . lymphedema    HPI  Pt is here for routine follow up.    Current Medication: Outpatient Encounter Medications as of 11/27/2017  Medication Sig  . albuterol (PROVENTIL HFA;VENTOLIN HFA) 108 (90 Base) MCG/ACT inhaler Inhale 1 puff into the lungs every 6 (six) hours as needed for wheezing or shortness of breath.  Marland Kitchen aspirin 325 MG EC tablet Take 325 mg by mouth daily.  Marland Kitchen atorvastatin (LIPITOR) 40 MG tablet Take 40 mg by mouth at bedtime.   . carvedilol (COREG) 25 MG tablet Take 25 mg by mouth 2 (two) times daily with a meal.  . docusate sodium (COLACE) 50 MG capsule Take 50 mg by mouth 2 (two) times daily.  . febuxostat (ULORIC) 40 MG tablet Take one tab a day for gout, cannot take allopurinol (Patient taking differently: Take 40 mg by mouth daily. )  . gabapentin (NEURONTIN) 400 MG capsule Take 400 mg by mouth 3 (three) times daily.   . hydrALAZINE (APRESOLINE) 25 MG tablet Take 25 mg by mouth 3 (three) times daily.  . indapamide (LOZOL) 2.5 MG tablet Take 1 tablet (2.5 mg total) by mouth daily.  . Insulin Glargine (BASAGLAR KWIKPEN) 100 UNIT/ML SOPN Inject 30 Units into the skin at bedtime.  . Liraglutide (VICTOZA) 18 MG/3ML SOPN Inject 1.8 mg into the skin daily.   . Multiple Vitamin (MULTIVITAMIN WITH MINERALS) TABS tablet Take 1 tablet by mouth daily.  Marland Kitchen omeprazole (PRILOSEC) 20 MG capsule Take 20 mg by mouth daily.  . potassium chloride SA (K-DUR,KLOR-CON) 20 MEQ tablet Take 20 mEq by mouth 2 (two) times daily.  Marland Kitchen zolpidem (AMBIEN CR) 12.5 MG CR tablet Take 1 tablet (12.5 mg total) by mouth at bedtime as needed for sleep.  . [DISCONTINUED] indapamide (LOZOL) 2.5 MG tablet Take 1 tablet (2.5 mg total) by mouth daily.  . [DISCONTINUED]  pentoxifylline (TRENTAL) 400 MG CR tablet Take 400 mg by mouth 3 (three) times daily.  Marland Kitchen amoxicillin-clavulanate (AUGMENTIN) 875-125 MG tablet Take 1 tablet by mouth 2 (two) times daily. (Patient not taking: Reported on 10/25/2017)  . colchicine 0.6 MG tablet Take 1 tablet (0.6 mg total) by mouth daily. (Patient not taking: Reported on 11/27/2017)  . fentaNYL (DURAGESIC - DOSED MCG/HR) 50 MCG/HR Place 1 patch (50 mcg total) onto the skin every 3 (three) days.  . furosemide (LASIX) 40 MG tablet Take one tablet (40mg ) by mouth daily  . ibuprofen (ADVIL,MOTRIN) 600 MG tablet Take 600 mg by mouth 2 (two) times daily as needed for mild pain or moderate pain.   Marland Kitchen ipratropium-albuterol (DUONEB) 0.5-2.5 (3) MG/3ML SOLN Take 3 mLs by nebulization every 4 (four) hours as needed (for shortness of breath).  Marland Kitchen levofloxacin (LEVAQUIN) 750 MG tablet Take 1 tablet (750 mg total) by mouth daily.  . methylPREDNISolone (MEDROL) 4 MG TBPK tablet Take by mouth as directed for 6 days  . metolazone (ZAROXOLYN) 2.5 MG tablet Take 1 tablet (2.5 mg total) by mouth daily.  . Oxycodone HCl 10 MG TABS Half to one tab as needed prn for pain (Patient taking differently: Half to one tab  prn for pain)  . sennosides-docusate sodium (SENOKOT-S) 8.6-50 MG tablet Take 1-2 tablets by mouth 2 (two) times daily.  Marland Kitchen UREA 20  INTENSIVE HYDRATING 20 % cream USE AS DIRECTED 3 TIMES A WEEK  . [DISCONTINUED] fentaNYL (DURAGESIC - DOSED MCG/HR) 50 MCG/HR Place 1 patch (50 mcg total) onto the skin every 3 (three) days.  . [DISCONTINUED] metolazone (ZAROXOLYN) 2.5 MG tablet TAKE 1 TABLET BY MOUTH DAILY  . [DISCONTINUED] Oxycodone HCl 10 MG TABS Take 1 tablet (10 mg total) by mouth 3 (three) times daily as needed.   No facility-administered encounter medications on file as of 11/27/2017.     Surgical History: Past Surgical History:  Procedure Laterality Date  . CHOLECYSTECTOMY    . CORONARY ARTERY BYPASS GRAFT      Medical History: Past  Medical History:  Diagnosis Date  . Anemia   . CAD (coronary artery disease)   . Chest pain   . Coronary artery disease   . Diabetes mellitus without complication ([place])   . Esophageal reflux   . Herpes zoster without mention of complication   . Hyperlipidemia   . Hypertension   . Insomnia   . Lumbago   . Neuropathy in diabetes ([place])   . Obstructive chronic bronchitis without exacerbation ([place])   . Other malaise and fatigue   . Stroke ([place])   . Varicose veins     Family History: Family History  Problem Relation Age of Onset  . Diabetes Mother   . Hyperlipidemia Mother   . Hypertension Mother   . Diabetes Father   . Hyperlipidemia Father   . Hypertension Father     Social History   Socioeconomic History  . Marital status: Married    Spouse name: Not on file  . Number of children: Not on file  . Years of education: Not on file  . Highest education level: Not on file  Occupational History  . Not on file  Social Needs  . Financial resource strain: Not on file  . Food insecurity:    Worry: Not on file    Inability: Not on file  . Transportation needs:    Medical: Not on file    Non-medical: Not on file  Tobacco Use  . Smoking status: Never Smoker  . Smokeless tobacco: Never Used  Substance and Sexual Activity  . Alcohol use: No  . Drug use: No  . Sexual activity: Not on file  Lifestyle  . Physical activity:    Days per week: Not on file    Minutes per session: Not on file  . Stress: Not on file  Relationships  . Social connections:    Talks on phone: Not on file    Gets together: Not on file    Attends religious service: Not on file    Active member of club or organization: Not on file    Attends meetings of clubs or organizations: Not on file    Relationship status: Not on file  . Intimate partner violence:    Fear of current or ex partner: Not on file    Emotionally abused: Not on file    Physically abused: Not on file    Forced sexual activity:  Not on file  Other Topics Concern  . Not on file  Social History Narrative  . Not on file      Review of Systems  Constitutional: Negative for chills, fatigue and unexpected weight change.  HENT: Negative for congestion, postnasal drip, rhinorrhea, sneezing and sore throat.   Eyes: Negative for redness.  Respiratory: Negative for cough, chest tightness and shortness of breath.   Cardiovascular: Negative  for chest pain and palpitations.  Gastrointestinal: Negative for abdominal pain, constipation, diarrhea, nausea and vomiting.  Genitourinary: Negative for dysuria and frequency.  Musculoskeletal: Positive for arthralgias and joint swelling. Negative for back pain and neck pain.  Skin: Negative for rash.  Neurological: Negative.  Negative for tremors and numbness.  Hematological: Negative for adenopathy. Does not bruise/bleed easily.  Psychiatric/Behavioral: Negative for behavioral problems (Depression), sleep disturbance and suicidal ideas. The patient is not nervous/anxious.     Vital Signs: BP (!) 164/90   Pulse 66   Resp 16   Ht 5\' 6"  (1.676 m)   Wt 296 lb 12.8 oz (134.6 kg)   SpO2 95%   BMI 47.90 kg/m   Physical Exam  Constitutional: He is oriented to person, place, and time. He appears well-developed and well-nourished. No distress.  HENT:  Head: Normocephalic and atraumatic.  Mouth/Throat: Oropharynx is clear and moist. No oropharyngeal exudate.  Eyes: Pupils are equal, round, and reactive to light. EOM are normal.  Neck: Normal range of motion. Neck supple. No JVD present. No tracheal deviation present. No thyromegaly present.  Cardiovascular: Normal rate, regular rhythm and normal heart sounds. Exam reveals no gallop and no friction rub.  No murmur heard. Pulmonary/Chest: Effort normal. No respiratory distress. He has no wheezes. He has no rales. He exhibits no tenderness.  Abdominal: Soft. Bowel sounds are normal.  Musculoskeletal: Normal range of motion.   Lymphadenopathy:    He has no cervical adenopathy.  Neurological: He is alert and oriented to person, place, and time. No cranial nerve deficit.  Skin: Skin is warm and dry. He is not diaphoretic.  Wrapped BLE with UNA boots from wound clinic.   Psychiatric: He has a normal mood and affect. His behavior is normal. Judgment and thought content normal.   Assessment/Plan: 1. Chronic pain disorder Continue  fentaNYL (DURAGESIC - DOSED MCG/HR) 50 MCG/HR; Place 1 patch (50 mcg total) onto the skin every 3 (three) days.  Dispense: 10 patch; Refill: 0  Oxycodone HCl 10 MG TABS; Half to one tab as needed prn for pain (Patient taking differently: Half to one tab  prn for pain)  Dispense: 30 tablet; Refill: 0. bp is elevated as pt is in pain   2. Encounter for long-term (current) use of medications  POCT Urine Drug Screen  3. Uncontrolled type 2 diabetes mellitus with hyperglycemia (HCC) Continue diabetic management as prescribed   Microalbumin, urine  4. Lymphedema of both lower extremities Continue to see wound management  Continue potassium chloride SA (K-DUR,KLOR-CON) 20 MEQ tablet; Take 20 mEq by mouth 2 (two) times daily. - indapamide (LOZOL) 2.5 MG tablet; Take 1 tablet (2.5 mg total) by mouth daily.  Dispense: 90 tablet; Refill: 3 - metolazone (ZAROXOLYN) 2.5 MG tablet; Take 1 tablet (2.5 mg total) by mouth daily.  Dispense: 90 tablet; Refill: 3  General Counseling: Nyeem verbalizes understanding of the findings of todays visit and agrees with plan of treatment. I have discussed any further diagnostic evaluation that may be needed or ordered today. We also reviewed his medications today. he has been encouraged to call the office with any questions or concerns that should arise related to todays visit.  Counseling Reviewed risks and possible side effects associated with taking opiates and benzodiazepines. Combination of these could cause dizziness and drowsiness. Advised him not to drive or  operate machinery when taking these medications, as he could put his life and the lives of others at risk. He voiced understanding.  Orders Placed This Encounter  Procedures  . Microalbumin, urine  . POCT Urine Drug Screen    Meds ordered this encounter  Medications  . fentaNYL (DURAGESIC - DOSED MCG/HR) 50 MCG/HR    Sig: Place 1 patch (50 mcg total) onto the skin every 3 (three) days.    Dispense:  10 patch    Refill:  0  . indapamide (LOZOL) 2.5 MG tablet    Sig: Take 1 tablet (2.5 mg total) by mouth daily.    Dispense:  90 tablet    Refill:  3  . metolazone (ZAROXOLYN) 2.5 MG tablet    Sig: Take 1 tablet (2.5 mg total) by mouth daily.    Dispense:  90 tablet    Refill:  3  . Oxycodone HCl 10 MG TABS    Sig: Half to one tab as needed prn for pain    Dispense:  30 tablet    Refill:  0    Time spent: 88 Minutes   Dr [name] Internal medicine

## 2017-11-28 ENCOUNTER — Telehealth: Payer: Self-pay

## 2017-11-28 LAB — MICROALBUMIN, URINE: Microalbumin, Urine: <3 ug/mL

## 2017-11-28 MED ORDER — OXYCODONE HCL 10 MG PO TABS: ORAL_TABLET | ORAL | 0 refills | Status: DC

## 2017-11-28 NOTE — Telephone Encounter (Signed)
Pt was notified of results and change in directions of oxy.

## 2017-11-28 NOTE — Progress Notes (Signed)
Continue all medications as before including potassium. I am changing direction on her oxy

## 2017-11-28 NOTE — Telephone Encounter (Signed)
-----   Message from [name], MD sent at 11/28/2017  1:33 PM EDT ----- Continue all medications as before including potassium. I am changing direction on her oxy

## 2017-11-29 ENCOUNTER — Ambulatory Visit: Payer: Medicare Other | Admitting: Nurse Practitioner

## 2017-11-29 ENCOUNTER — Ambulatory Visit: Payer: Self-pay | Admitting: Internal Medicine

## 2017-11-29 NOTE — Progress Notes (Signed)
[name], [name] ([id number]) Visit Report for 11/27/2017 Chief Complaint Document Details Patient Name: Adam Merritt, Adam Merritt Date of Service: 11/27/2017 3:45 PM Medical Record Number: [id number] Patient Account Number: [id number] Date of Birth/Sex: Nov 28, 1945 (72 y.o. M) Treating RN: Montey Hora Primary Care Provider: [name] Other Clinician: Referring Provider: [name] Treating Provider/Extender: Melburn Hake, [name]: 5 Information Obtained from: Patient Chief Complaint he is here for evaluation of bilateral lower extremity lymphedema/venous ulcers Electronic Signature(s) Signed: 11/28/2017 12:21:38 AM By: Worthy Keeler PA-C Entered By: Worthy Keeler on 11/27/2017 16:05:49 Adam Merritt ([id number]) -------------------------------------------------------------------------------- HPI Details Patient Name: Adam Merritt Date of Service: 11/27/2017 3:45 PM Medical Record Number: [id number] Patient Account Number: [id number] Date of Birth/Sex: January 18, 1946 (72 y.o. M) Treating RN: Montey Hora Primary Care Provider: [name] Other Clinician: Referring Provider: [name] Treating Provider/Extender: Melburn Hake, [name]: 5 History of Present Illness Location: bilateral lower extremity Quality: no pain to ulcerations Duration: ulcerations have been present 3-4 weeks Context: ulcerations of secondary to edema, blister formation HPI Description: 10/18/17-He is here for initial evaluation of bilateral lower extremity ulcerations in the presence of venous insufficiency and lymphedema. He has been seen by vascular medicine in the past, Dr. Lucky Cowboy, last seen in 2016. He does have a history of abnormal ABIs, which is to be expected given his lymphedema and venous insufficiency. According to Epic, it appears that all attempts for arterial evaluation and/or angiography were not follow through with by patient. He does have a history of being seen in lymphedema  clinic in 2018, stopped going approximately 6 months ago stating "it didn't do any good". He does not have lymphedema pumps, he does not have custom fit compression wrap/stockings. He is diabetic and his recent A1c last month was 7.6. He admits to chronic bilateral lower extremity pain, no change in pain since blister and ulceration development. He is currently being treated with Levaquin for bronchitis. He has home health and we will continue. 10/25/17-He is here in follow-up evaluation for bilateral lower extremity ulcerationssubtle he remains on Levaquin for bronchitis. Right lower extremity with no evidence of drainage or ulceration, persistent left lower extremity ulceration. He states that home health has not been out since his appointment. He went to [facility] on Tuesday, studies revealed: RIGHT ABI 0.9, TBI 0.6 LEFT ABI 1.1, TBI 0.6 with triphasic flow bilaterally. We will continue his same treatment plan. He has been educated on compression therapy and need for elevation. He will benefit from lymphedema pumps 11/01/17-He is here in follow-up evaluation for left lower extremity ulcer. The right lower extremity remains healed. He has home health services, but they have not been out to see the patient for 2-3 weeks. He states it home health physical therapy changed his dressing yesterday after therapy; he placed Ace wrap compression. We are still waiting for lymphedema pumps, reordered d/t need for company change. 11/08/17-He is here in follow-up evaluation for left lower extremity ulcer. It is improved. Edema is significantly improved with compression therapy. We will continue with same treatment plan and he will follow-up next week. No word regarding lymphedema pumps 11/15/17-He is here in follow-up evaluation for left lower extremity ulcer. He is healed and will be discharged from wound care services. I have reached out to [employer] regarding his lymphedema pumps. They  have been unable to reach the patient; the contact number they had with the patient's wife's cell phone and she has not answered  any unrecognized calls. Contact should be made today, trial planned for next week; [employer] will continue to follow 11/27/17 on evaluation today patient has multiple blistered areas over the right lower extremity his left lower extremity appears to be doing okay. These blistered areas show signs of no infection which is great news. With that being said he did have some necrotic skin overlying which was mechanically debrided away with saline and gauze today without complication. Overall post debridement the wounds appear to be doing better but in general his swelling seems to be increased. This is obviously not good news. I think this is what has given rise to the blisters. Electronic Signature(s) Signed: 11/28/2017 12:21:38 AM By: Worthy Keeler PA-C Entered By: Worthy Keeler on 11/28/2017 00:19:26 Adam Merritt ([id number]) -------------------------------------------------------------------------------- Physical Exam Details Patient Name: Adam Merritt Date of Service: 11/27/2017 3:45 PM Medical Record Number: [id number] Patient Account Number: [id number] Date of Birth/Sex: Oct 09, 1945 (72 y.o. M) Treating RN: Montey Hora Primary Care Provider: [name] Other Clinician: Referring Provider: [name] Treating Provider/Extender: STONE III, [name]: 5 Constitutional Obese and well-hydrated in no acute distress. Respiratory normal breathing without difficulty. clear to auscultation bilaterally. Cardiovascular regular rate and rhythm with normal S1, S2. Stage 3 lymphedema. Psychiatric this patient is able to make decisions and demonstrates good insight into disease process. Alert and Oriented x 3. pleasant and cooperative. Notes At this point on inspection there does not appear to be any evidence of infection is best I can tell  at this point. He does have significant lower extremity edema is wraps are not being done all the way up to just below the knee his wife is actually performing the wraps. This is just an ace wrap up to this point. I do think we will need to restart him on a three layer compression wrap in order to get this back under control actually gonna do this for both sides at this point. He is still working on trying to get the lymphedema pumps he has been contacted by the company although no one has actually come out to get everything established at this point. Electronic Signature(s) Signed: 11/28/2017 12:21:38 AM By: Worthy Keeler PA-C Entered By: Worthy Keeler on 11/28/2017 00:20:27 Adam Merritt ([id number]) -------------------------------------------------------------------------------- Physician Orders Details Patient Name: Adam Merritt Date of Service: 11/27/2017 3:45 PM Medical Record Number: [id number] Patient Account Number: [id number] Date of Birth/Sex: [birth year] (72 y.o. M) Treating RN: Montey Hora Primary Care Provider: [name] Other Clinician: Referring Provider: [name] Treating Provider/Extender: Melburn Hake, [name]: 5 Verbal / Phone Orders: No Diagnosis Coding ICD-10 Coding Code Description L97.222 Non-pressure chronic ulcer of left calf with fat layer exposed I89.0 Lymphedema, not elsewhere classified E66.9 Obesity, unspecified Wound Cleansing Wound #13 Right Lower Leg o Clean wound with Normal Saline. o May Shower, gently pat wound dry prior to applying new dressing. Skin Barriers/Peri-Wound Care Wound #13 Right Lower Leg o Moisturizing lotion Primary Wound Dressing Wound #13 Right Lower Leg o Hydrafera Blue Ready Transfer Secondary Dressing Wound #13 Right Lower Leg o ABD pad Dressing Change Frequency Wound #13 Right Lower Leg o Other: - Tuesdays at [place] and Fridays by [facility] Follow-up  Appointments Wound #13 Right Lower Leg o Return Appointment in 1 week. Edema Control Wound #13 Right Lower Leg o 3 Layer Compression System - Bilateral Additional Orders / Instructions Wound #13 Right Lower Leg o Activity as tolerated Home  [place] ([id number]) Wound #13 Right Lower Leg o [place] Nurse may visit PRN to address patientos wound care needs. o FACE TO FACE ENCOUNTER: MEDICARE and MEDICAID PATIENTS: I certify that this patient is under my care and that I had a face-to-face encounter that meets the physician face-to-face encounter requirements with this patient on this date. The encounter with the patient was in whole or in part for the following MEDICAL CONDITION: (primary reason for [place]) MEDICAL NECESSITY: I certify, that based on my findings, NURSING services are a medically necessary home health service. HOME BOUND STATUS: I certify that my clinical findings support that this patient is homebound (i.e., Due to illness or injury, pt requires aid of supportive devices such as crutches, cane, wheelchairs, walkers, the use of special transportation or the assistance of another person to leave their place of residence. There is a normal inability to leave the home and doing so requires considerable and taxing effort. Other absences are for medical reasons / religious services and are infrequent or of short duration when for other reasons). o If current dressing causes regression in wound condition, may D/C ordered dressing product/s and apply Normal Saline Moist Dressing daily until next Miguel Barrera / Other MD appointment. [place] of regression in wound condition at [phone]. o Please direct any NON-WOUND related issues/requests for orders to patient's Primary Care Physician Electronic Signature(s) Signed: 11/27/2017 5:42:13 PM By: Montey Hora Signed: 11/28/2017  12:21:38 AM By: Worthy Keeler PA-C Entered By: Montey Hora on 11/27/2017 16:29:37 Adam Merritt ([id number]) -------------------------------------------------------------------------------- Problem List Details Patient Name: Adam Merritt Date of Service: 11/27/2017 3:45 PM Medical Record Number: [id number] Patient Account Number: [id number] Date of Birth/Sex: 11/12/1945 (72 y.o. M) Treating RN: Montey Hora Primary Care Provider: [name] Other Clinician: Referring Provider: [name] Treating Provider/Extender: Melburn Hake, [name]: 5 Active Problems ICD-10 Evaluated Encounter Code Description Active Date Today Diagnosis L97.222 Non-pressure chronic ulcer of left calf with fat layer exposed 10/18/2017 No Yes I89.0 Lymphedema, not elsewhere classified 10/18/2017 No Yes E66.9 Obesity, unspecified 10/18/2017 No Yes Inactive Problems Resolved Problems ICD-10 Code Description Active Date Resolved Date L97.211 Non-pressure chronic ulcer of right calf limited to breakdown of skin 10/18/2017 10/18/2017 Electronic Signature(s) Signed: 11/28/2017 12:21:38 AM By: Worthy Keeler PA-C Entered By: Worthy Keeler on 11/27/2017 16:05:39 Adam Merritt ([id number]) -------------------------------------------------------------------------------- Progress Note Details Patient Name: Adam Merritt Date of Service: 11/27/2017 3:45 PM Medical Record Number: [id number] Patient Account Number: [id number] Date of Birth/Sex: 07/24/1945 (72 y.o. M) Treating RN: Montey Hora Primary Care Provider: [name] Other Clinician: Referring Provider: [name] Treating Provider/Extender: Melburn Hake, [name]: 5 Subjective Chief Complaint Information obtained from Patient he is here for evaluation of bilateral lower extremity lymphedema/venous ulcers History of Present Illness (HPI) The following HPI elements were documented for the patient's wound: Location:  bilateral lower extremity Quality: no pain to ulcerations Duration: ulcerations have been present 3-4 weeks Context: ulcerations of secondary to edema, blister formation 10/18/17-He is here for initial evaluation of bilateral lower extremity ulcerations in the presence of venous insufficiency and lymphedema. He has been seen by vascular medicine in the past, Dr. Lucky Cowboy, last seen in 2016. He does have a history of abnormal ABIs, which is to be expected given his lymphedema and venous insufficiency. According to Epic, it appears that all attempts for arterial evaluation and/or angiography were not follow  through with by patient. He does have a history of being seen in lymphedema clinic in 2018, stopped going approximately 6 months ago stating "it didn't do any good". He does not have lymphedema pumps, he does not have custom fit compression wrap/stockings. He is diabetic and his recent A1c last month was 7.6. He admits to chronic bilateral lower extremity pain, no change in pain since blister and ulceration development. He is currently being treated with Levaquin for bronchitis. He has home health and we will continue. 10/25/17-He is here in follow-up evaluation for bilateral lower extremity ulcerationssubtle he remains on Levaquin for bronchitis. Right lower extremity with no evidence of drainage or ulceration, persistent left lower extremity ulceration. He states that home health has not been out since his appointment. He went to [facility] on Tuesday, studies revealed: RIGHT ABI 0.9, TBI 0.6 LEFT ABI 1.1, TBI 0.6 with triphasic flow bilaterally. We will continue his same treatment plan. He has been educated on compression therapy and need for elevation. He will benefit from lymphedema pumps 11/01/17-He is here in follow-up evaluation for left lower extremity ulcer. The right lower extremity remains healed. He has home health services, but they have not been out to see the patient for  2-3 weeks. He states it home health physical therapy changed his dressing yesterday after therapy; he placed Ace wrap compression. We are still waiting for lymphedema pumps, reordered d/t need for company change. 11/08/17-He is here in follow-up evaluation for left lower extremity ulcer. It is improved. Edema is significantly improved with compression therapy. We will continue with same treatment plan and he will follow-up next week. No word regarding lymphedema pumps 11/15/17-He is here in follow-up evaluation for left lower extremity ulcer. He is healed and will be discharged from wound care services. I have reached out to [facility] regarding his lymphedema pumps. They have been unable to reach the patient; the contact number they had with the patient's wife's cell phone and she has not answered any unrecognized calls. Contact should be made today, trial planned for next week; [facility] will continue to follow 11/27/17 on evaluation today patient has multiple blistered areas over the right lower extremity his left lower extremity appears to be doing okay. These blistered areas show signs of no infection which is great news. With that being said he did have some necrotic skin overlying which was mechanically debrided away with saline and gauze today without complication. Overall post debridement the wounds appear to be doing better but in general his swelling seems to be increased. This is obviously not good news. I think this is what has given rise to the blisters. Patient History Information obtained from Patient. Adam Merritt, Adam Merritt ([id number]) Family History Cancer - Paternal Grandparents, Kidney Disease - Siblings, Lung Disease - Mother, No family history of Diabetes, Heart Disease, Hereditary Spherocytosis, Hypertension, Seizures, Stroke, Thyroid Problems, Tuberculosis. Social History Never smoker, Marital Status - Married, Alcohol Use - Never, Drug Use - No History, Caffeine  Use - Never. Medical History Hospitalization/Surgery History - 11/03/2013, [facility], bronchitis. Medical And Surgical History Notes Respiratory home O2 at night as needed Cardiovascular varicose veins Neurologic CVA - 1992 Review of Systems (ROS) Constitutional Symptoms (General Health) Denies complaints or symptoms of Fever, Chills. Respiratory The patient has no complaints or symptoms. Cardiovascular Complains or has symptoms of LE edema. Psychiatric The patient has no complaints or symptoms. Objective Constitutional Obese and well-hydrated in no acute distress. Vitals Time Taken: 3:30 PM, Height: 66  in, Weight: 298 lbs, BMI: 48.1, Temperature: 98.2 F, Pulse: 68 bpm, Respiratory Rate: 18 breaths/min, Blood Pressure: 155/86 mmHg. Respiratory normal breathing without difficulty. clear to auscultation bilaterally. Cardiovascular regular rate and rhythm with normal S1, S2. Stage 3 lymphedema. Psychiatric this patient is able to make decisions and demonstrates good insight into disease process. Alert and Oriented x 3. pleasant and cooperative. General Notes: At this point on inspection there does not appear to be any evidence of infection is best I can tell at this point. Adam Merritt, Adam Merritt ([id number]) He does have significant lower extremity edema is wraps are not being done all the way up to just below the knee his wife is actually performing the wraps. This is just an ace wrap up to this point. I do think we will need to restart him on a three layer compression wrap in order to get this back under control actually gonna do this for both sides at this point. He is still working on trying to get the lymphedema pumps he has been contacted by the company although no one has actually come out to get everything established at this point. Integumentary (Hair, Skin) Wound #13 status is Open. Original cause of wound was Gradually Appeared. The wound is located on the Right Lower Leg. The  wound measures 3.7cm length x 15.3cm width x 0.1cm depth; 44.461cm^2 area and 4.446cm^3 volume. There is no tunneling or undermining noted. There is a large amount of serosanguineous drainage noted. There is large (67-100%) red granulation within the wound bed. The periwound skin appearance exhibited: Mottled. Assessment Active Problems ICD-10 Non-pressure chronic ulcer of left calf with fat layer exposed Lymphedema, not elsewhere classified Obesity, unspecified Procedures Wound #13 Pre-procedure diagnosis of Wound #13 is a Lymphedema located on the Right Lower Leg . There was a Three Layer Compression Therapy Procedure with a pre-treatment ABI of 1 by Montey Hora, RN. Post procedure Diagnosis Wound #13: Same as Pre-Procedure Plan Wound Cleansing: Wound #13 Right Lower Leg: Clean wound with Normal Saline. May Shower, gently pat wound dry prior to applying new dressing. Skin Barriers/Peri-Wound Care: Wound #13 Right Lower Leg: Moisturizing lotion Primary Wound Dressing: Wound #13 Right Lower Leg: Hydrafera Blue Ready Transfer Secondary Dressing: Wound #13 Right Lower Leg: ABD pad Dressing Change Frequency: Wound #13 Right Lower Leg: Adam Merritt, Adam Merritt ([id number]) Other: - Tuesdays at [place] and Fridays by [facility] Follow-up Appointments: Wound #13 Right Lower Leg: Return Appointment in 1 week. Edema Control: Wound #13 Right Lower Leg: 3 Layer Compression System - Bilateral Additional Orders / Instructions: Wound #13 Right Lower Leg: Activity as tolerated Home Health: Wound #13 Right Lower Leg: [place] Nurse may visit PRN to address patient s wound care needs. FACE TO FACE ENCOUNTER: MEDICARE and MEDICAID PATIENTS: I certify that this patient is under my care and that I had a face-to-face encounter that meets the physician face-to-face encounter requirements with this patient on this date. The encounter with the patient was  in whole or in part for the following MEDICAL CONDITION: (primary reason for [place]) MEDICAL NECESSITY: I certify, that based on my findings, NURSING services are a medically necessary home health service. HOME BOUND STATUS: I certify that my clinical findings support that this patient is homebound (i.e., Due to illness or injury, pt requires aid of supportive devices such as crutches, cane, wheelchairs, walkers, the use of special transportation or the assistance of another person to leave their place of  residence. There is a normal inability to leave the home and doing so requires considerable and taxing effort. Other absences are for medical reasons / religious services and are infrequent or of short duration when for other reasons). If current dressing causes regression in wound condition, may D/C ordered dressing product/s and apply Normal Saline Moist Dressing daily until next [place] / Other MD appointment. [place] of regression in wound condition at [phone]. Please direct any NON-WOUND related issues/requests for orders to patient's Primary Care Physician I am going to suggest currently that we go ahead and initiate treatment with the above wound care orders for the next week. The patient is in agreement with this plan. We will see were things stand at follow-up. Please see above for specific wound care orders. We will see patient for re-evaluation in 1 week(s) here in the clinic. If anything worsens or changes patient will contact our office for additional recommendations. Electronic Signature(s) Signed: 11/28/2017 12:21:38 AM By: Worthy Keeler PA-C Entered By: Worthy Keeler on 11/28/2017 00:20:47 Adam Merritt ([id number]) -------------------------------------------------------------------------------- ROS/PFSH Details Patient Name: Adam Merritt Date of Service: 11/27/2017 3:45 PM Medical Record Number: [id number] Patient Account  Number: [id number] Date of Birth/Sex: 07-Dec-1945 (72 y.o. M) Treating RN: Montey Hora Primary Care Provider: [name] Other Clinician: Referring Provider: [name] Treating Provider/Extender: Melburn Hake, [name]: 5 Information Obtained From Patient Wound History Do you currently have one or more open woundso No Have you tested positive for osteomyelitis (bone infection)o No Have you had any tests for circulation on your legso Yes Who ordered the testo [facility] Where was the test doneo [facility] Have you had other problems associated with your woundso Swelling Constitutional Symptoms (General Health) Complaints and Symptoms: Negative for: Fever; Chills Cardiovascular Complaints and Symptoms: Positive for: LE edema Medical History: Positive for: Congestive Heart Failure; Coronary Artery Disease; Hypertension; Peripheral Venous Disease Negative for: Angina; Arrhythmia; Deep Vein Thrombosis; Hypotension; Myocardial Infarction; Peripheral Arterial Disease; Phlebitis; Vasculitis Past Medical History Notes: varicose veins Eyes Medical History: Negative for: Cataracts; Glaucoma; Optic Neuritis Ear/Nose/Mouth/Throat Medical History: Negative for: Chronic sinus problems/congestion; Middle ear problems Hematologic/Lymphatic Medical History: Positive for: Anemia - aplastic anemia; Lymphedema Negative for: Hemophilia; Human Immunodeficiency Virus; Sickle Cell Disease Respiratory Complaints and Symptoms: No Complaints or Symptoms Medical History: Positive for: Chronic Obstructive Pulmonary Disease (COPD); Sleep Apnea Adam Merritt, Adam Merritt ([id number]) Negative for: Aspiration; Asthma; Pneumothorax; Tuberculosis Past Medical History Notes: home O2 at night as needed Gastrointestinal Medical History: Negative for: Cirrhosis ; Colitis; Crohnos; Hepatitis A; Hepatitis B; Hepatitis C Endocrine Medical History: Positive for: Type II Diabetes Negative for: Type I  Diabetes Time with diabetes: since 2006 Treated with: Insulin Blood sugar tested every day: Yes Tested : Blood sugar testing results: Breakfast: 209 Genitourinary Medical History: Negative for: End Stage Renal Disease Immunological Medical History: Negative for: Lupus Erythematosus; Raynaudos; Scleroderma Integumentary (Skin) Medical History: Negative for: History of Burn; History of pressure wounds Musculoskeletal Medical History: Positive for: Osteoarthritis Negative for: Gout; Rheumatoid Arthritis; Osteomyelitis Neurologic Medical History: Positive for: Neuropathy Negative for: Dementia; Quadriplegia; Paraplegia; Seizure Disorder Past Medical History Notes: CVA - 1992 Oncologic Medical History: Positive for: Received Chemotherapy - last dose 2006 Negative for: Received Radiation Psychiatric Complaints and Symptoms: No Complaints or Symptoms Adam Merritt, Adam Merritt ([id number]) Medical History: Negative for: Anorexia/bulimia; Confinement Anxiety Immunizations Pneumococcal Vaccine: Received Pneumococcal Vaccination: Yes Implantable Devices Hospitalization / Surgery History Name of Hospital Purpose of Hospitalization/Surgery Date [facility]  bronchitis 11/03/2013 Family and Social History Cancer: Yes - Paternal Grandparents; Diabetes: No; Heart Disease: No; Hereditary Spherocytosis: No; Hypertension: No; Kidney Disease: Yes - Siblings; Lung Disease: Yes - Mother; Seizures: No; Stroke: No; Thyroid Problems: No; Tuberculosis: No; Never smoker; Marital Status - Married; Alcohol Use: Never; Drug Use: No History; Caffeine Use: Never; Financial Concerns: No; Food, Clothing or Shelter Needs: No; Support System Lacking: No; Transportation Concerns: No; Advanced Directives: No; Patient does not want information on Advanced Directives Physician Affirmation I have reviewed and agree with the above information. Electronic Signature(s) Signed: 11/28/2017 12:21:38 AM By: Worthy Keeler  PA-C Signed: 11/28/2017 5:20:21 PM By: Montey Hora Entered By: Worthy Keeler on 11/28/2017 00:19:47 Adam Merritt ([id number]) -------------------------------------------------------------------------------- SuperBill Details Patient Name: Adam Merritt Date of Service: 11/27/2017 Medical Record Number: [id number] Patient Account Number: [id number] Date of Birth/Sex: Jun 15, 1945 (72 y.o. M) Treating RN: Montey Hora Primary Care Provider: [name] Other Clinician: Referring Provider: [name] Treating Provider/Extender: Melburn Hake, [name]: 5 Diagnosis Coding ICD-10 Codes Code Description [phone] Non-pressure chronic ulcer of left calf with fat layer exposed I89.0 Lymphedema, not elsewhere classified E66.9 Obesity, unspecified Facility Procedures CPT4: Description Modifier Quantity Code [id number] BILATERAL: Application of multi-layer venous compression system; leg (below 1 knee), including ankle and foot. Physician Procedures CPT4 Code: 3664403 Description: 47425 - WC PHYS LEVEL 3 - EST PT ICD-10 Diagnosis Description L97.222 Non-pressure chronic ulcer of left calf with fat layer expo I89.0 Lymphedema, not elsewhere classified E66.9 Obesity, unspecified Modifier: sed Quantity: 1 Electronic Signature(s) Signed: 11/28/2017 12:21:38 AM By: Worthy Keeler PA-C Previous Signature: 11/27/2017 5:42:13 PM Version By: Montey Hora Entered By: Worthy Keeler on 11/28/2017 00:21:02

## 2017-11-29 NOTE — Progress Notes (Signed)
Adam Merritt ([id number]) Visit Report for 11/27/2017 Arrival Information Details Patient Name: Adam Merritt Date of Service: 11/27/2017 3:45 PM Medical Record Number: [id number] Patient Account Number: [id number] Date of Birth/Sex: 08/25/1945 (72 y.o. M) Treating RN: [name] Ginger Primary Care Makiah Clauson: [name] Other Clinician: Referring Edmon Magid: [name] Treating Xavier Munger/Extender: Melburn Hake, [name]: 5 Visit Information History Since Last Visit All ordered tests and consults were completed: No Patient Arrived: Ambulatory Added or deleted any medications: No Arrival Time: 15:36 Had a fall or experienced change in No Accompanied By: wife activities of daily living that may affect Transfer Assistance: None risk of falls: Patient Identification Verified: Yes Signs or symptoms of abuse/neglect since last visito No Secondary Verification Process Completed: Yes Hospitalized since last visit: No Patient Has Alerts: Yes Implantable device outside of the clinic excluding No Patient Alerts: DMII cellular tissue based products placed in the center since last visit: Has Dressing in Place as Prescribed: Yes Pain Present Now: Yes Electronic Signature(s) Signed: 11/27/2017 4:32:29 PM By: [name] Ginger Entered By: [name] Ginger on 11/27/2017 15:37:32 Adam Merritt ([id number]) -------------------------------------------------------------------------------- Compression Therapy Details Patient Name: Adam Merritt Date of Service: 11/27/2017 3:45 PM Medical Record Number: [id number] Patient Account Number: [id number] Date of Birth/Sex: 07-Jun-1945 (72 y.o. M) Treating RN: Montey Hora Primary Care Andruw Battie: [name] Other Clinician: Referring Teniyah Seivert: [name] Treating Arthi Mcdonald/Extender: Melburn Hake, [name]: 5 Compression Therapy Performed for Wound Assessment: Wound #13 Right Lower Leg Performed By: Clinician Montey Hora, RN Compression Type:  Three Layer Pre Treatment ABI: 1 Post Procedure Diagnosis Same as Pre-procedure Electronic Signature(s) Signed: 11/27/2017 5:42:13 PM By: Montey Hora Entered By: Montey Hora on 11/27/2017 16:33:07 Adam Merritt ([id number]) -------------------------------------------------------------------------------- Encounter Discharge Information Details Patient Name: Adam Merritt Date of Service: 11/27/2017 3:45 PM Medical Record Number: [id number] Patient Account Number: [id number] Date of Birth/Sex: 1946/06/05 (72 y.o. M) Treating RN: Roger Shelter Primary Care Reesha Debes: [name] Other Clinician: Referring Harsha Yusko: [name] Treating Kashis Penley/Extender: Melburn Hake, [name]: 5 Encounter Discharge Information Items Discharge Condition: Stable Ambulatory Status: Cane Discharge Destination: Home Transportation: Private Auto Schedule Follow-up Appointment: Yes Clinical Summary of Care: Electronic Signature(s) Signed: 11/27/2017 4:47:28 PM By: Roger Shelter Entered By: Roger Shelter on 11/27/2017 16:43:24 Adam Merritt ([id number]) -------------------------------------------------------------------------------- Lower Extremity Assessment Details Patient Name: Adam Merritt Date of Service: 11/27/2017 3:45 PM Medical Record Number: [id number] Patient Account Number: [id number] Date of Birth/Sex: 06-07-1945 (72 y.o. M) Treating RN: [name] Ginger Primary Care Fredrich Cory: [name] Other Clinician: Referring Clearnce Leja: [name] Treating Irby Fails/Extender: Melburn Hake, [name]: 5 Edema Assessment Assessed: [Left: No] [Right: No] [Left: Edema] [Right: :] Calf Left: Right: Point of Measurement: 32 cm From Medial Instep 60 cm 51 cm Ankle Left: Right: Point of Measurement: 11 cm From Medial Instep 33.5 cm 31 cm Vascular Assessment Claudication: Claudication Assessment [Left:None] [Right:None] Pulses: Dorsalis Pedis Palpable: [Left:Yes]  [Right:Yes] Posterior Tibial Extremity colors, hair growth, and conditions: Temperature of Extremity: [Left:Warm] [Right:Warm] Capillary Refill: [Left:< 3 seconds] [Right:< 3 seconds] Toe Nail Assessment Left: Right: Thick: Yes Yes Discolored: Yes Yes Deformed: Yes Yes Improper Length and Hygiene: No No Electronic Signature(s) Signed: 11/27/2017 4:32:29 PM By: [name] Ginger Entered By: [name] Ginger on 11/27/2017 16:02:05 Adam Merritt ([id number]) -------------------------------------------------------------------------------- Multi Wound Chart Details Patient Name: Adam Merritt Date of Service: 11/27/2017 3:45 PM Medical Record Number: [id number] Patient Account Number: [id number] Date of Birth/Sex: [birth year] (72 y.o. M) Treating RN:  Montey Hora Primary Care Lovett Coffin: [name] Other Clinician: Referring Earle Burson: [name] Treating Drina Jobst/Extender: Melburn Hake, [name]: 5 Vital Signs Height(in): 63 Pulse(bpm): 74 Weight(lbs): 298 Blood Pressure(mmHg): 155/86 Body Mass Index(BMI): 48 Temperature(F): 98.2 Respiratory Rate 18 (breaths/min): Photos: [N/A:N/A] Wound Location: Right Lower Leg N/A N/A Wounding Event: Gradually Appeared N/A N/A Primary Etiology: Lymphedema N/A N/A Comorbid History: Anemia, Lymphedema, N/A N/A Chronic Obstructive Pulmonary Disease (COPD), Sleep Apnea, Congestive Heart Failure, Coronary Artery Disease, Hypertension, Peripheral Venous Disease, Type II Diabetes, Osteoarthritis, Neuropathy, Received Chemotherapy Date Acquired: 08/20/2017 N/A N/A Weeks of Treatment: 0 N/A N/A Wound Status: Open N/A N/A Clustered Wound: Yes N/A N/A Measurements L x W x D 3.7x15.3x0.1 N/A N/A (cm) Area (cm) : 44.461 N/A N/A Volume (cm) : 4.446 N/A N/A % Reduction in Area: 54.00% N/A N/A % Reduction in Volume: 54.00% N/A N/A Classification: Partial Thickness N/A N/A Exudate Amount: Large N/A N/A Exudate Type: Serosanguineous N/A  N/A Exudate Color: red, brown N/A N/A Granulation Amount: Large (67-100%) N/A N/A Granulation Quality: Red N/A N/A Periwound Skin Texture: No Abnormalities Noted N/A N/A Periwound Skin Moisture: No Abnormalities Noted N/A N/A NEDIM, OKI ([id number]) Periwound Skin Color: Mottled: Yes N/A N/A Tenderness on Palpation: No N/A N/A Treatment Notes Electronic Signature(s) Signed: 11/27/2017 5:42:13 PM By: Montey Hora Entered By: Montey Hora on 11/27/2017 16:25:23 Adam Merritt ([id number]) -------------------------------------------------------------------------------- [place] Details Patient Name: Adam Merritt Date of Service: 11/27/2017 3:45 PM Medical Record Number: [id number] Patient Account Number: [id number] Date of Birth/Sex: May 06, 1946 (72 y.o. M) Treating RN: Montey Hora Primary Care Christoher Drudge: [name] Other Clinician: Referring Rishita Petron: [name] Treating Gean Larose/Extender: Melburn Hake, [name]: 5 Active Inactive ` Abuse / Safety / Falls / Self Care Management Nursing Diagnoses: Potential for falls Goals: Patient will not experience any injury related to falls Date Initiated: 10/18/2017 Target Resolution Date: 02/09/2018 Goal Status: Active Interventions: Assess Activities of Daily Living upon admission and as needed Assess fall risk on admission and as needed Assess: immobility, friction, shearing, incontinence upon admission and as needed Assess impairment of mobility on admission and as needed per policy Assess personal safety and home safety (as indicated) on admission and as needed Assess self care needs on admission and as needed Notes: ` Orientation to the Wound Care Program Nursing Diagnoses: Knowledge deficit related to the wound healing center program Goals: Patient/caregiver will verbalize understanding of the [place] Program Date Initiated: 10/18/2017 Target Resolution Date:  11/10/2017 Goal Status: Active Interventions: Provide education on orientation to the wound center Notes: ` Wound/Skin Impairment Nursing Diagnoses: Impaired tissue integrity Knowledge deficit related to ulceration/compromised skin integrity ELIGAH, ANELLO ([id number]) Goals: Ulcer/skin breakdown will have a volume reduction of 80% by week 12 Date Initiated: 10/18/2017 Target Resolution Date: 01/05/2018 Goal Status: Active Interventions: Assess patient/caregiver ability to perform ulcer/skin care regimen upon admission and as needed Assess ulceration(s) every visit Notes: Electronic Signature(s) Signed: 11/27/2017 5:42:13 PM By: Montey Hora Entered By: Montey Hora on 11/27/2017 Edmondson, Peyton W. ([id number]) -------------------------------------------------------------------------------- Pain Assessment Details Patient Name: Adam Merritt Date of Service: 11/27/2017 3:45 PM Medical Record Number: [id number] Patient Account Number: [id number] Date of Birth/Sex: 04/12/1946 (72 y.o. M) Treating RN: [name] Ginger Primary Care Nickey Kloepfer: [name] Other Clinician: Referring Zian Mohamed: [name] Treating Fidelia Cathers/Extender: Melburn Hake, [name]: 5 Active Problems Location of Pain Severity and Description of Pain Patient Has Paino Yes Site Locations Character of Pain Describe the Pain:  Burning Pain Management and Medication Current Pain Management: Electronic Signature(s) Signed: 11/27/2017 4:32:29 PM By: [name] Ginger Entered By: [name] Ginger on 11/27/2017 15:40:22 Adam Merritt ([id number]) -------------------------------------------------------------------------------- Patient/Caregiver Education Details Patient Name: Adam Merritt Date of Service: 11/27/2017 3:45 PM Medical Record Number: [id number] Patient Account Number: [id number] Date of Birth/Gender: 1945-11-14 (72 y.o. M) Treating RN: Roger Shelter Primary Care Physician: [name] Other  Clinician: Referring Physician: [name] Treating Physician/Extender: Sharalyn Ink in Treatment: 5 Education Assessment Education Provided To: Patient Education Topics Provided Wound Debridement: Handouts: Wound Debridement Methods: Explain/Verbal Responses: State content correctly Wound/Skin Impairment: Handouts: Caring for Your Ulcer Methods: Explain/Verbal Responses: State content correctly Electronic Signature(s) Signed: 11/27/2017 4:47:28 PM By: Roger Shelter Entered By: Roger Shelter on 11/27/2017 16:43:41 Adam Merritt ([id number]) -------------------------------------------------------------------------------- Wound Assessment Details Patient Name: Adam Merritt Date of Service: 11/27/2017 3:45 PM Medical Record Number: [id number] Patient Account Number: [id number] Date of Birth/Sex: [birth year] (72 y.o. M) Treating RN: Montey Hora Primary Care Murice Barbar: [name] Other Clinician: Referring Ragen Laver: [name] Treating Earland Reish/Extender: STONE III, [name]: 5 Wound Status Wound Number: 13 Primary Lymphedema Etiology: Wound Location: Right Lower Leg Wound Open Wounding Event: Gradually Appeared Status: Date Acquired: 08/20/2017 Comorbid Anemia, Lymphedema, Chronic Obstructive Weeks Of Treatment: 0 History: Pulmonary Disease (COPD), Sleep Apnea, Clustered Wound: Yes Congestive Heart Failure, Coronary Artery Disease, Hypertension, Peripheral Venous Disease, Type II Diabetes, Osteoarthritis, Neuropathy, Received Chemotherapy Photos Photo Uploaded By: [name] Ginger on 11/27/2017 16:10:15 Wound Measurements Length: (cm) 3.7 Width: (cm) 15.3 Depth: (cm) 0.1 Area: (cm) 44.461 Volume: (cm) 4.446 % Reduction in Area: 54% % Reduction in Volume: 54% Tunneling: No Undermining: No Wound Description Classification: Partial Thickness Foul Od Exudate Amount: Large Slough/ Exudate Type: Serosanguineous Exudate Color: red,  brown or After Cleansing: No Fibrino No Wound Bed Granulation Amount: Large (67-100%) Granulation Quality: Red Periwound Skin Texture Texture Color No Abnormalities Noted: No No Abnormalities Noted: No Mottled: Yes Moisture No Abnormalities Noted: No XAVYER, STEENSON ([id number]) Treatment Notes Wound #13 (Right Lower Leg) 1. Cleansed with: Clean wound with Normal Saline 2. Anesthetic Topical Lidocaine 4% cream to wound bed prior to debridement 3. Peri-wound Care: Moisturizing lotion 4. Dressing Applied: Hydrafera Blue 5. Secondary Dressing Applied ABD Pad 7. Secured with 3 Layer Compression System - Bilateral Electronic Signature(s) Signed: 11/27/2017 5:42:13 PM By: Montey Hora Entered By: Montey Hora on 11/27/2017 16:24:45 Adam Merritt ([id number]) -------------------------------------------------------------------------------- Vitals Details Patient Name: Adam Merritt Date of Service: 11/27/2017 3:45 PM Medical Record Number: [id number] Patient Account Number: [id number] Date of Birth/Sex: 23-Sep-1945 (72 y.o. M) Treating RN: [name] Ginger Primary Care Lavarr President: [name] Other Clinician: Referring Yamin Swingler: [name] Treating Bernadene Garside/Extender: Melburn Hake, [name]: 5 Vital Signs Time Taken: 15:30 Temperature (F): 98.2 Height (in): 66 Pulse (bpm): 68 Weight (lbs): 298 Respiratory Rate (breaths/min): 18 Body Mass Index (BMI): 48.1 Blood Pressure (mmHg): 155/86 Reference Range: 80 - 120 mg / dl Electronic Signature(s) Signed: 11/27/2017 4:32:29 PM By: [name] Ginger Entered BySecundino Ginger on 11/27/2017 15:41:02

## 2017-12-04 ENCOUNTER — Encounter: Payer: Medicare Other | Attending: Physician Assistant | Admitting: Physician Assistant

## 2017-12-04 ENCOUNTER — Telehealth: Payer: Self-pay

## 2017-12-04 DIAGNOSIS — Z8673 Personal history of transient ischemic attack (TIA), and cerebral infarction without residual deficits: Secondary | ICD-10-CM | POA: Insufficient documentation

## 2017-12-04 DIAGNOSIS — E1140 Type 2 diabetes mellitus with diabetic neuropathy, unspecified: Secondary | ICD-10-CM | POA: Insufficient documentation

## 2017-12-04 DIAGNOSIS — L97222 Non-pressure chronic ulcer of left calf with fat layer exposed: Secondary | ICD-10-CM | POA: Diagnosis present

## 2017-12-04 DIAGNOSIS — I2510 Atherosclerotic heart disease of native coronary artery without angina pectoris: Secondary | ICD-10-CM | POA: Insufficient documentation

## 2017-12-04 DIAGNOSIS — Z888 Allergy status to other drugs, medicaments and biological substances status: Secondary | ICD-10-CM | POA: Insufficient documentation

## 2017-12-04 DIAGNOSIS — I890 Lymphedema, not elsewhere classified: Secondary | ICD-10-CM | POA: Insufficient documentation

## 2017-12-04 DIAGNOSIS — Z6841 Body Mass Index (BMI) 40.0 and over, adult: Secondary | ICD-10-CM | POA: Diagnosis not present

## 2017-12-04 DIAGNOSIS — Z9221 Personal history of antineoplastic chemotherapy: Secondary | ICD-10-CM | POA: Insufficient documentation

## 2017-12-04 DIAGNOSIS — M1990 Unspecified osteoarthritis, unspecified site: Secondary | ICD-10-CM | POA: Diagnosis not present

## 2017-12-04 DIAGNOSIS — E1151 Type 2 diabetes mellitus with diabetic peripheral angiopathy without gangrene: Secondary | ICD-10-CM | POA: Insufficient documentation

## 2017-12-04 DIAGNOSIS — E669 Obesity, unspecified: Secondary | ICD-10-CM | POA: Insufficient documentation

## 2017-12-04 DIAGNOSIS — E11622 Type 2 diabetes mellitus with other skin ulcer: Secondary | ICD-10-CM | POA: Diagnosis not present

## 2017-12-04 DIAGNOSIS — I110 Hypertensive heart disease with heart failure: Secondary | ICD-10-CM | POA: Insufficient documentation

## 2017-12-04 DIAGNOSIS — I509 Heart failure, unspecified: Secondary | ICD-10-CM | POA: Diagnosis not present

## 2017-12-04 NOTE — Telephone Encounter (Signed)
ERROR

## 2017-12-06 NOTE — Progress Notes (Signed)
VIR, WHETSTINE ([id number]) Visit Report for 12/04/2017 Arrival Information Details Patient Name: Adam Merritt, Adam Merritt Date of Service: 12/04/2017 1:45 PM Medical Record Number: [id number] Patient Account Number: [id number] Date of Birth/Sex: 01-25-1946 (72 y.o. M) Treating RN: Montey Hora Primary Care Afton Mikelson: [name] Other Clinician: Referring Khaila Velarde: [name] Treating Sarrah Fiorenza/Extender: Melburn Hake, [name]: 6 Visit Information History Since Last Visit Added or deleted any medications: No Patient Arrived: Cane Any new allergies or adverse reactions: No Arrival Time: 14:11 Had a fall or experienced change in No Accompanied By: wife activities of daily living that may affect Transfer Assistance: None risk of falls: Patient Identification Verified: Yes Signs or symptoms of abuse/neglect since last visito No Secondary Verification Process Completed: Yes Hospitalized since last visit: No Patient Has Alerts: Yes Implantable device outside of the clinic excluding No Patient Alerts: DMII cellular tissue based products placed in the center since last visit: Has Dressing in Place as Prescribed: Yes Has Compression in Place as Prescribed: Yes Pain Present Now: No Electronic Signature(s) Signed: 12/04/2017 4:10:25 PM By: Montey Hora Entered By: Montey Hora on 12/04/2017 14:13:19 Adam Merritt ([id number]) -------------------------------------------------------------------------------- Encounter Discharge Information Details Patient Name: Adam Merritt Date of Service: 12/04/2017 1:45 PM Medical Record Number: [id number] Patient Account Number: [id number] Date of Birth/Sex: [birth year] (72 y.o. M) Treating RN: Roger Shelter Primary Care Elainna Eshleman: [name] Other Clinician: Referring Jarek Longton: [name] Treating Boris Engelmann/Extender: Melburn Hake, [name]: 6 Encounter Discharge Information Items Discharge Condition: Stable Ambulatory Status:  Cane Discharge Destination: Home Transportation: Private Auto Schedule Follow-up Appointment: Yes Clinical Summary of Care: Electronic Signature(s) Signed: 12/05/2017 3:34:51 PM By: Roger Shelter Entered By: Roger Shelter on 12/04/2017 15:09:45 Adam Merritt ([id number]) -------------------------------------------------------------------------------- Lower Extremity Assessment Details Patient Name: Adam Merritt Date of Service: 12/04/2017 1:45 PM Medical Record Number: [id number] Patient Account Number: [id number] Date of Birth/Sex: 1945/11/25 (72 y.o. M) Treating RN: Montey Hora Primary Care Donicia Druck: [name] Other Clinician: Referring Narcissus Detwiler: [name] Treating Daisia Slomski/Extender: Melburn Hake, [name]: 6 Edema Assessment Assessed: [Left: No] [Right: No] [Left: Edema] [Right: :] Calf Left: Right: Point of Measurement: 32 cm From Medial Instep 56.3 cm 49.8 cm Ankle Left: Right: Point of Measurement: 11 cm From Medial Instep 33.7 cm 28.6 cm Vascular Assessment Pulses: Dorsalis Pedis Palpable: [Left:Yes] [Right:Yes] Posterior Tibial Extremity colors, hair growth, and conditions: Extremity Color: [Left:Hyperpigmented] [Right:Hyperpigmented] Hair Growth on Extremity: [Left:No] [Right:No] Temperature of Extremity: [Left:Warm] [Right:Warm] Capillary Refill: [Left:< 3 seconds] [Right:< 3 seconds] Toe Nail Assessment Left: Right: Thick: Yes Yes Discolored: Yes Yes Deformed: No No Improper Length and Hygiene: No No Electronic Signature(s) Signed: 12/04/2017 4:10:25 PM By: Montey Hora Entered By: Montey Hora on 12/04/2017 14:23:27 Adam Merritt ([id number]) -------------------------------------------------------------------------------- Multi Wound Chart Details Patient Name: Adam Merritt Date of Service: 12/04/2017 1:45 PM Medical Record Number: [id number] Patient Account Number: [id number] Date of Birth/Sex: [birth year] (72 y.o.  M) Treating RN: Ahmed Prima Primary Care Zosia Lucchese: [name] Other Clinician: Referring Neta Upadhyay: [name] Treating Nalea Salce/Extender: Melburn Hake, [name]: 6 Vital Signs Height(in): 19 Pulse(bpm): 33 Weight(lbs): 298 Blood Pressure(mmHg): 134/61 Body Mass Index(BMI): 48 Temperature(F): 98.3 Respiratory Rate 18 (breaths/min): Photos: [13:No Photos] [N/A:N/A] Wound Location: [13:Right Lower Leg] [N/A:N/A] Wounding Event: [13:Gradually Appeared] [N/A:N/A] Primary Etiology: [13:Lymphedema] [N/A:N/A] Comorbid History: [13:Anemia, Lymphedema, Chronic Obstructive Pulmonary Disease (COPD), Sleep Apnea, Congestive Heart Failure, Coronary Artery Disease, Hypertension, Peripheral Venous Disease, Type II Diabetes, Osteoarthritis, Neuropathy, Received  Chemotherapy] [N/A:N/A] Date Acquired: [  13:08/20/2017] [N/A:N/A] Weeks of Treatment: [13:1] [N/A:N/A] Wound Status: [13:Open] [N/A:N/A] Clustered Wound: [13:Yes] [N/A:N/A] Measurements L x W x D [13:3.5x14.2x0.1] [N/A:N/A] (cm) Area (cm) : [13:39.034] [N/A:N/A] Volume (cm) : [13:3.903] [N/A:N/A] % Reduction in Area: [13:12.20%] [N/A:N/A] % Reduction in Volume: [13:12.20%] [N/A:N/A] Classification: [13:Partial Thickness] [N/A:N/A] Exudate Amount: [13:Large] [N/A:N/A] Exudate Type: [13:Serosanguineous] [N/A:N/A] Exudate Color: [13:red, brown] [N/A:N/A] Wound Margin: [13:Flat and Intact] [N/A:N/A] Granulation Amount: [13:Large (67-100%)] [N/A:N/A] Granulation Quality: [13:Red] [N/A:N/A] Necrotic Amount: [13:None Present (0%)] [N/A:N/A] Exposed Structures: [13:Fascia: No Fat Layer (Subcutaneous Tissue) Exposed: No Tendon: No Muscle: No] [N/A:N/A] Joint: No Bone: No Epithelialization: Medium (34-66%) N/A N/A Periwound Skin Texture: Excoriation: No N/A N/A Induration: No Callus: No Crepitus: No Rash: No Scarring: No Periwound Skin Moisture: Maceration: No N/A N/A Dry/Scaly: No Periwound Skin  Color: Hemosiderin Staining: Yes N/A N/A Atrophie Blanche: No Cyanosis: No Ecchymosis: No Erythema: No Mottled: No Pallor: No Rubor: No Temperature: No Abnormality N/A N/A Tenderness on Palpation: No N/A N/A Wound Preparation: Ulcer Cleansing: Other: soap N/A N/A and water Topical Anesthetic Applied: Other: lidocaine 4% Treatment Notes Electronic Signature(s) Signed: 12/04/2017 4:10:39 PM By: Alric Quan Entered By: Alric Quan on 12/04/2017 14:42:14 Adam Merritt ([id number]) -------------------------------------------------------------------------------- [place] Details Patient Name: Adam Merritt Date of Service: 12/04/2017 1:45 PM Medical Record Number: [id number] Patient Account Number: [id number] Date of Birth/Sex: 04/15/1946 (72 y.o. M) Treating RN: Ahmed Prima Primary Care Francene Mcerlean: [name] Other Clinician: Referring Catheryn Slifer: [name] Treating Jannelly Bergren/Extender: Melburn Hake, [name]: 6 Active Inactive ` Abuse / Safety / Falls / Self Care Management Nursing Diagnoses: Potential for falls Goals: Patient will not experience any injury related to falls Date Initiated: 10/18/2017 Target Resolution Date: 02/09/2018 Goal Status: Active Interventions: Assess Activities of Daily Living upon admission and as needed Assess fall risk on admission and as needed Assess: immobility, friction, shearing, incontinence upon admission and as needed Assess impairment of mobility on admission and as needed per policy Assess personal safety and home safety (as indicated) on admission and as needed Assess self care needs on admission and as needed Notes: ` Orientation to the Wound Care Program Nursing Diagnoses: Knowledge deficit related to the wound healing center program Goals: Patient/caregiver will verbalize understanding of the [facility] Date Initiated: 10/18/2017 Target Resolution Date: 11/10/2017 Goal  Status: Active Interventions: Provide education on orientation to the wound center Notes: ` Wound/Skin Impairment Nursing Diagnoses: Impaired tissue integrity Knowledge deficit related to ulceration/compromised skin integrity KAEDYN, POLIVKA ([id number]) Goals: Ulcer/skin breakdown will have a volume reduction of 80% by week 12 Date Initiated: 10/18/2017 Target Resolution Date: 01/05/2018 Goal Status: Active Interventions: Assess patient/caregiver ability to perform ulcer/skin care regimen upon admission and as needed Assess ulceration(s) every visit Notes: Electronic Signature(s) Signed: 12/04/2017 4:10:39 PM By: Alric Quan Entered By: Alric Quan on 12/04/2017 14:41:55 Adam Merritt ([id number]) -------------------------------------------------------------------------------- Pain Assessment Details Patient Name: Adam Merritt Date of Service: 12/04/2017 1:45 PM Medical Record Number: [id number] Patient Account Number: [id number] Date of Birth/Sex: August 16, 1945 (72 y.o. M) Treating RN: Montey Hora Primary Care Tkeyah Burkman: [name] Other Clinician: Referring Viera Okonski: [name] Treating Camrin Gearheart/Extender: Melburn Hake, [name]: 6 Active Problems Location of Pain Severity and Description of Pain Patient Has Paino No Site Locations Pain Management and Medication Current Pain Management: Electronic Signature(s) Signed: 12/04/2017 4:10:25 PM By: Montey Hora Entered By: Montey Hora on 12/04/2017 14:13:27 Adam Merritt ([id number]) -------------------------------------------------------------------------------- Patient/Caregiver Education Details Patient Name: Adam Merritt. Date  of Service: 12/04/2017 1:45 PM Medical Record Number: [id number] Patient Account Number: [id number] Date of Birth/Gender: 10/05/1945 (72 y.o. M) Treating RN: Roger Shelter Primary Care Physician: [name] Other Clinician: Referring Physician: [name] Treating  Physician/Extender: Sharalyn Ink in Treatment: 6 Education Assessment Education Provided To: Patient Education Topics Provided Wound Debridement: Handouts: Wound Debridement Methods: Explain/Verbal Responses: State content correctly Wound/Skin Impairment: Handouts: Caring for Your Ulcer Methods: Explain/Verbal Responses: State content correctly Electronic Signature(s) Signed: 12/05/2017 3:34:51 PM By: Roger Shelter Entered By: Roger Shelter on 12/04/2017 15:10:05 Adam Merritt ([id number]) -------------------------------------------------------------------------------- Wound Assessment Details Patient Name: Adam Merritt Date of Service: 12/04/2017 1:45 PM Medical Record Number: [id number] Patient Account Number: [id number] Date of Birth/Sex: 12/12/1945 (72 y.o. M) Treating RN: Montey Hora Primary Care Polo Mcmartin: [name] Other Clinician: Referring Everley Evora: [name] Treating Tinsley Everman/Extender: Melburn Hake, [name]: 6 Wound Status Wound Number: 13 Primary Lymphedema Etiology: Wound Location: Right Lower Leg Wound Open Wounding Event: Gradually Appeared Status: Date Acquired: 08/20/2017 Comorbid Anemia, Lymphedema, Chronic Obstructive Weeks Of Treatment: 1 History: Pulmonary Disease (COPD), Sleep Apnea, Clustered Wound: Yes Congestive Heart Failure, Coronary Artery Disease, Hypertension, Peripheral Venous Disease, Type II Diabetes, Osteoarthritis, Neuropathy, Received Chemotherapy Photos Photo Uploaded By: Montey Hora on 12/04/2017 15:11:37 Wound Measurements Length: (cm) 3.5 Width: (cm) 14.2 Depth: (cm) 0.1 Area: (cm) 39.034 Volume: (cm) 3.903 % Reduction in Area: 12.2% % Reduction in Volume: 12.2% Epithelialization: Medium (34-66%) Tunneling: No Undermining: No Wound Description Classification: Partial Thickness Foul O Wound Margin: Flat and Intact Slough Exudate Amount: Large Exudate Type: Serosanguineous Exudate  Color: red, brown dor After Cleansing: No /Fibrino No Wound Bed Granulation Amount: Large (67-100%) Exposed Structure Granulation Quality: Red Fascia Exposed: No Necrotic Amount: None Present (0%) Fat Layer (Subcutaneous Tissue) Exposed: No Tendon Exposed: No Muscle Exposed: No Joint Exposed: No RANA, ADORNO ([id number]) Bone Exposed: No Periwound Skin Texture Texture Color No Abnormalities Noted: No No Abnormalities Noted: No Callus: No Atrophie Blanche: No Crepitus: No Cyanosis: No Excoriation: No Ecchymosis: No Induration: No Erythema: No Rash: No Hemosiderin Staining: Yes Scarring: No Mottled: No Pallor: No Moisture Rubor: No No Abnormalities Noted: No Dry / Scaly: No Temperature / Pain Maceration: No Temperature: No Abnormality Wound Preparation Ulcer Cleansing: Other: soap and water, Topical Anesthetic Applied: Other: lidocaine 4%, Treatment Notes Wound #13 (Right Lower Leg) 1. Cleansed with: Clean wound with Normal Saline 2. Anesthetic Topical Lidocaine 4% cream to wound bed prior to debridement 3. Peri-wound Care: Moisturizing lotion 4. Dressing Applied: Hydrafera Blue 5. Secondary Dressing Applied ABD Pad 7. Secured with 3 Layer Compression System - Bilateral Electronic Signature(s) Signed: 12/04/2017 4:10:25 PM By: Montey Hora Entered By: Montey Hora on 12/04/2017 14:27:59 Adam Merritt ([id number]) -------------------------------------------------------------------------------- [place] Details Patient Name: Adam Merritt Date of Service: 12/04/2017 1:45 PM Medical Record Number: [id number] Patient Account Number: [id number] Date of Birth/Sex: [birth year] (72 y.o. M) Treating RN: Montey Hora Primary Care Caitlynne Harbeck: [name] Other Clinician: Referring Kowen Kluth: [name] Treating Coral Timme/Extender: Melburn Hake, [name]: 6 Vital Signs Time Taken: 14:14 Temperature (F): 98.3 Height (in): 66 Pulse (bpm):  77 Weight (lbs): 298 Respiratory Rate (breaths/min): 18 Body Mass Index (BMI): 48.1 Blood Pressure (mmHg): 134/61 Reference Range: 80 - 120 mg / dl Electronic Signature(s) Signed: 12/04/2017 4:10:25 PM By: Montey Hora Entered By: Montey Hora on 12/04/2017 14:15:21

## 2017-12-06 NOTE — Progress Notes (Signed)
Adam Merritt ([id number]) Visit Report for 12/04/2017 Chief Complaint Document Details Patient Name: Adam Merritt Date of Service: 12/04/2017 1:45 PM Medical Record Number: [id number] Patient Account Number: [id number] Date of Birth/Sex: 12-30-1945 (72 y.o. M) Treating RN: Ahmed Prima Primary Care Provider: [name] Other Clinician: Referring Provider: [name] Treating Provider/Extender: Melburn Hake, Virl Coble [name]: 6 Information Obtained from: Patient Chief Complaint he is here for evaluation of bilateral lower extremity lymphedema/venous ulcers Electronic Signature(s) Signed: 12/05/2017 1:22:53 AM By: Worthy Keeler PA-C Entered By: Worthy Keeler on 12/04/2017 13:38:04 Adam Merritt ([id number]) -------------------------------------------------------------------------------- HPI Details Patient Name: Adam Merritt Date of Service: 12/04/2017 1:45 PM Medical Record Number: [id number] Patient Account Number: [id number] Date of Birth/Sex: Sep 14, 1945 (72 y.o. M) Treating RN: Ahmed Prima Primary Care Provider: [name] Other Clinician: Referring Provider: [name] Treating Provider/Extender: Melburn Hake, Tisha Cline [name]: 6 History of Present Illness Location: bilateral lower extremity Quality: no pain to ulcerations Duration: ulcerations have been present 3-4 weeks Context: ulcerations of secondary to edema, blister formation HPI Description: 10/18/17-He is here for initial evaluation of bilateral lower extremity ulcerations in the presence of venous insufficiency and lymphedema. He has been seen by vascular medicine in the past, Dr. Lucky Cowboy, last seen in 2016. He does have a history of abnormal ABIs, which is to be expected given his lymphedema and venous insufficiency. According to Epic, it appears that all attempts for arterial evaluation and/or angiography were not follow through with by patient. He does have a history of being seen in lymphedema  clinic in 2018, stopped going approximately 6 months ago stating "it didn't do any good". He does not have lymphedema pumps, he does not have custom fit compression wrap/stockings. He is diabetic and his recent A1c last month was 7.6. He admits to chronic bilateral lower extremity pain, no change in pain since blister and ulceration development. He is currently being treated with Levaquin for bronchitis. He has home health and we will continue. 10/25/17-He is here in follow-up evaluation for bilateral lower extremity ulcerationssubtle he remains on Levaquin for bronchitis. Right lower extremity with no evidence of drainage or ulceration, persistent left lower extremity ulceration. He states that home health has not been out since his appointment. He went to [facility] on Tuesday, studies revealed: RIGHT ABI 0.9, TBI 0.6 LEFT ABI 1.1, TBI 0.6 with triphasic flow bilaterally. We will continue his same treatment plan. He has been educated on compression therapy and need for elevation. He will benefit from lymphedema pumps 11/01/17-He is here in follow-up evaluation for left lower extremity ulcer. The right lower extremity remains healed. He has home health services, but they have not been out to see the patient for 2-3 weeks. He states it home health physical therapy changed his dressing yesterday after therapy; he placed Ace wrap compression. We are still waiting for lymphedema pumps, reordered d/t need for company change. 11/08/17-He is here in follow-up evaluation for left lower extremity ulcer. It is improved. Edema is significantly improved with compression therapy. We will continue with same treatment plan and he will follow-up next week. No word regarding lymphedema pumps 11/15/17-He is here in follow-up evaluation for left lower extremity ulcer. He is healed and will be discharged from wound care services. I have reached out to [facility] regarding his lymphedema pumps. They  have been unable to reach the patient; the contact number they had with the patient's wife's cell phone and she has not answered  any unrecognized calls. Contact should be made today, trial planned for next week; [facility] will continue to follow 11/27/17 on evaluation today patient has multiple blistered areas over the right lower extremity his left lower extremity appears to be doing okay. These blistered areas show signs of no infection which is great news. With that being said he did have some necrotic skin overlying which was mechanically debrided away with saline and gauze today without complication. Overall post debridement the wounds appear to be doing better but in general his swelling seems to be increased. This is obviously not good news. I think this is what has given rise to the blisters. 12/04/17 on evaluation today patient presents for follow-up concerning his bilateral lower extremity edema in the right lower extremity ulcers. He has been tolerating the dressing changes without complication. With that being said he has had no real issues with the wraps which is also good news. Overall I'm pleased with the progress he's been making. Electronic Signature(s) Signed: 12/05/2017 1:22:53 AM By: Worthy Keeler PA-C Entered By: Worthy Keeler on 12/05/2017 00:05:41 Adam Merritt ([id number]) -------------------------------------------------------------------------------- Physical Exam Details Patient Name: Adam Merritt Date of Service: 12/04/2017 1:45 PM Medical Record Number: [id number] Patient Account Number: [id number] Date of Birth/Sex: 12-Jul-1945 (72 y.o. M) Treating RN: Ahmed Prima Primary Care Provider: [name] Other Clinician: Referring Provider: [name] Treating Provider/Extender: STONE III, Ellianne Gowen [name]: 6 Constitutional Chronically ill appearing but in no apparent acute distress. Respiratory normal breathing without difficulty. clear to  auscultation bilaterally. Cardiovascular regular rate and rhythm with normal S1, S2. Patient has bilateral LE stage 3 lymphedema. Psychiatric this patient is able to make decisions and demonstrates good insight into disease process. Alert and Oriented x 3. pleasant and cooperative. Notes Patient's wounds appear to be doing excellent and did not require sharp debridement today which was good news. Overall I feel like he is showing signs of improving fortunately he did pass the trial for the lymphedema pumps and he will be getting there shortly I think this will also be beneficial for him. Electronic Signature(s) Signed: 12/05/2017 1:22:53 AM By: Worthy Keeler PA-C Entered By: Worthy Keeler on 12/05/2017 00:06:44 Adam Merritt ([id number]) -------------------------------------------------------------------------------- Physician Orders Details Patient Name: Adam Merritt Date of Service: 12/04/2017 1:45 PM Medical Record Number: [id number] Patient Account Number: [id number] Date of Birth/Sex: 03/27/1946 (72 y.o. M) Treating RN: Ahmed Prima Primary Care Provider: [name] Other Clinician: Referring Provider: [name] Treating Provider/Extender: Melburn Hake, Disa Riedlinger [name]: 6 Verbal / Phone Orders: Yes ClinicianCarolyne Fiscal, Debi Read Back and Verified: Yes Diagnosis Coding ICD-10 Coding Code Description L97.222 Non-pressure chronic ulcer of left calf with fat layer exposed I89.0 Lymphedema, not elsewhere classified E66.9 Obesity, unspecified Wound Cleansing Wound #13 Right Lower Leg o Clean wound with Normal Saline. o May Shower, gently pat wound dry prior to applying new dressing. Skin Barriers/Peri-Wound Care Wound #13 Right Lower Leg o Moisturizing lotion Primary Wound Dressing Wound #13 Right Lower Leg o Hydrafera Blue Ready Transfer Secondary Dressing Wound #13 Right Lower Leg o ABD pad Dressing Change Frequency Wound #13 Right Lower  Leg o Other: - HHRN to change wraps on Fridays Tuesdays at [facility] Follow-up Appointments Wound #13 Right Lower Leg o Return Appointment in 1 week. Edema Control Wound #13 Right Lower Leg o 3 Layer Compression System - Bilateral - unna to anchor Additional Orders / Instructions Wound #13 Right Lower Leg o Activity  as tolerated JAMAHL, LEMMONS ([id number]) [place] #13 Right Lower Leg o [place] Visits - HHRN to change wraps on Fridays o Home Health Nurse may visit PRN to address patientos wound care needs. o FACE TO FACE ENCOUNTER: MEDICARE and MEDICAID PATIENTS: I certify that this patient is under my care and that I had a face-to-face encounter that meets the physician face-to-face encounter requirements with this patient on this date. The encounter with the patient was in whole or in part for the following MEDICAL CONDITION: (primary reason for [place]) MEDICAL NECESSITY: I certify, that based on my findings, NURSING services are a medically necessary home health service. HOME BOUND STATUS: I certify that my clinical findings support that this patient is homebound (i.e., Due to illness or injury, pt requires aid of supportive devices such as crutches, cane, wheelchairs, walkers, the use of special transportation or the assistance of another person to leave their place of residence. There is a normal inability to leave the home and doing so requires considerable and taxing effort. Other absences are for medical reasons / religious services and are infrequent or of short duration when for other reasons). o If current dressing causes regression in wound condition, may D/C ordered dressing product/s and apply Normal Saline Moist Dressing daily until next [place] / Other MD appointment. [place] of regression in wound condition at [phone]. o Please direct any NON-WOUND related  issues/requests for orders to patient's Primary Care Physician Patient Medications Allergies: Corticosteroids (Glucocorticoids) Notifications Medication Indication Start End lidocaine DOSE 1 - topical 4 % cream - 1 cream topical Electronic Signature(s) Signed: 12/04/2017 4:10:39 PM By: Alric Quan Signed: 12/05/2017 1:22:53 AM By: Worthy Keeler PA-C Entered By: Alric Quan on 12/04/2017 14:45:05 Adam Merritt ([id number]) -------------------------------------------------------------------------------- Prescription 12/04/2017 Patient Name: Adam Merritt Provider: Worthy Keeler PA-C Date of Birth: 1945/07/08 NPI#: [id number] Sex: M DEA#: [id number] Phone #: [phone] License #: Patient Address: [facility] [place], [place] [address] [address] [place], [place] [address] [phone] Allergies Corticosteroids (Glucocorticoids) Medication Medication: Route: Strength: Form: lidocaine topical 4% cream Class: TOPICAL LOCAL ANESTHETICS Dose: Frequency / Time: Indication: 1 1 cream topical Number of Refills: Number of Units: 0 Generic Substitution: Start Date: End Date: Administered at Substitution Permitted Facility: Yes Time Administered: Time Discontinued: Note to Pharmacy: Signature(s): Date(s): Electronic Signature(s) Signed: 12/04/2017 4:10:39 PM By: Alric Quan Signed: 12/05/2017 1:22:53 AM By: Worthy Keeler PA-C Entered By: Alric Quan on 12/04/2017 14:45:08 Adam Merritt ([id number]) RICK, CARRUTHERS ([id number]) --------------------------------------------------------------------------------  Problem List Details Patient Name: Adam Merritt Date of Service: 12/04/2017 1:45 PM Medical Record Number: [id number] Patient Account Number: [id number] Date of Birth/Sex: 1945/11/14 (72 y.o. M) Treating RN: Ahmed Prima Primary Care Provider:  [name] Other Clinician: Referring Provider: [name] Treating Provider/Extender: Melburn Hake, Kamaiyah Uselton [name]: 6 Active Problems ICD-10 Evaluated Encounter Code Description Active Date Today Diagnosis L97.222 Non-pressure chronic ulcer of left calf with fat layer exposed 10/18/2017 No Yes I89.0 Lymphedema, not elsewhere classified 10/18/2017 No Yes E66.9 Obesity, unspecified 10/18/2017 No Yes Inactive Problems Resolved Problems ICD-10 Code Description Active Date Resolved Date L97.211 Non-pressure chronic ulcer of right calf limited to breakdown of skin 10/18/2017 10/18/2017 Electronic Signature(s) Signed: 12/05/2017 1:22:53 AM By: Worthy Keeler PA-C Entered By: Worthy Keeler on 12/04/2017 13:37:53 Adam Merritt ([id number]) -------------------------------------------------------------------------------- Progress Note Details Patient Name:  Adam Merritt Date of Service: 12/04/2017 1:45 PM Medical Record Number: [id number] Patient Account Number: [id number] Date of Birth/Sex: [birth year] (72 y.o. M) Treating RN: Ahmed Prima Primary Care Provider: [name] Other Clinician: Referring Provider: [name] Treating Provider/Extender: Melburn Hake, Ameliana Brashear [name]: 6 Subjective Chief Complaint Information obtained from Patient he is here for evaluation of bilateral lower extremity lymphedema/venous ulcers History of Present Illness (HPI) The following HPI elements were documented for the patient's wound: Location: bilateral lower extremity Quality: no pain to ulcerations Duration: ulcerations have been present 3-4 weeks Context: ulcerations of secondary to edema, blister formation 10/18/17-He is here for initial evaluation of bilateral lower extremity ulcerations in the presence of venous insufficiency and lymphedema. He has been seen by vascular medicine in the past, Dr. Lucky Cowboy, last seen in 2016. He does have a history of abnormal ABIs, which is to be  expected given his lymphedema and venous insufficiency. According to Epic, it appears that all attempts for arterial evaluation and/or angiography were not follow through with by patient. He does have a history of being seen in lymphedema clinic in 2018, stopped going approximately 6 months ago stating "it didn't do any good". He does not have lymphedema pumps, he does not have custom fit compression wrap/stockings. He is diabetic and his recent A1c last month was 7.6. He admits to chronic bilateral lower extremity pain, no change in pain since blister and ulceration development. He is currently being treated with Levaquin for bronchitis. He has home health and we will continue. 10/25/17-He is here in follow-up evaluation for bilateral lower extremity ulcerationssubtle he remains on Levaquin for bronchitis. Right lower extremity with no evidence of drainage or ulceration, persistent left lower extremity ulceration. He states that home health has not been out since his appointment. He went to [facility] on Tuesday, studies revealed: RIGHT ABI 0.9, TBI 0.6 LEFT ABI 1.1, TBI 0.6 with triphasic flow bilaterally. We will continue his same treatment plan. He has been educated on compression therapy and need for elevation. He will benefit from lymphedema pumps 11/01/17-He is here in follow-up evaluation for left lower extremity ulcer. The right lower extremity remains healed. He has home health services, but they have not been out to see the patient for 2-3 weeks. He states it home health physical therapy changed his dressing yesterday after therapy; he placed Ace wrap compression. We are still waiting for lymphedema pumps, reordered d/t need for company change. 11/08/17-He is here in follow-up evaluation for left lower extremity ulcer. It is improved. Edema is significantly improved with compression therapy. We will continue with same treatment plan and he will follow-up next week. No word  regarding lymphedema pumps 11/15/17-He is here in follow-up evaluation for left lower extremity ulcer. He is healed and will be discharged from wound care services. I have reached out to [facility] regarding his lymphedema pumps. They have been unable to reach the patient; the contact number they had with the patient's wife's cell phone and she has not answered any unrecognized calls. Contact should be made today, trial planned for next week; [facility] will continue to follow 11/27/17 on evaluation today patient has multiple blistered areas over the right lower extremity his left lower extremity appears to be doing okay. These blistered areas show signs of no infection which is great news. With that being said he did have some necrotic skin overlying which was mechanically debrided away with saline and gauze today without complication. Overall post debridement  the wounds appear to be doing better but in general his swelling seems to be increased. This is obviously not good news. I think this is what has given rise to the blisters. 12/04/17 on evaluation today patient presents for follow-up concerning his bilateral lower extremity edema in the right lower extremity ulcers. He has been tolerating the dressing changes without complication. With that being said he has had no real issues with the wraps which is also good news. Overall I'm pleased with the progress he's been making. Adam, Merritt ([id number]) Patient History Information obtained from Patient. Family History Cancer - Paternal Grandparents, Kidney Disease - Siblings, Lung Disease - Mother, No family history of Diabetes, Heart Disease, Hereditary Spherocytosis, Hypertension, Seizures, Stroke, Thyroid Problems, Tuberculosis. Social History Never smoker, Marital Status - Married, Alcohol Use - Never, Drug Use - No History, Caffeine Use - Never. Medical History Hospitalization/Surgery History - 11/03/2013, [facility],  bronchitis. Medical And Surgical History Notes Respiratory home O2 at night as needed Cardiovascular varicose veins Neurologic CVA - 1992 Review of Systems (ROS) Constitutional Symptoms (General Health) Denies complaints or symptoms of Fever, Chills. Respiratory The patient has no complaints or symptoms. Cardiovascular Complains or has symptoms of LE edema. Psychiatric The patient has no complaints or symptoms. Objective Constitutional Chronically ill appearing but in no apparent acute distress. Vitals Time Taken: 2:14 PM, Height: 66 in, Weight: 298 lbs, BMI: 48.1, Temperature: 98.3 F, Pulse: 77 bpm, Respiratory Rate: 18 breaths/min, Blood Pressure: 134/61 mmHg. Respiratory normal breathing without difficulty. clear to auscultation bilaterally. Cardiovascular regular rate and rhythm with normal S1, S2. Patient has bilateral LE stage 3 lymphedema. Psychiatric this patient is able to make decisions and demonstrates good insight into disease process. Alert and Oriented x 3. pleasant Adam, Merritt ([id number]) and cooperative. General Notes: Patient's wounds appear to be doing excellent and did not require sharp debridement today which was good news. Overall I feel like he is showing signs of improving fortunately he did pass the trial for the lymphedema pumps and he will be getting there shortly I think this will also be beneficial for him. Integumentary (Hair, Skin) Wound #13 status is Open. Original cause of wound was Gradually Appeared. The wound is located on the Right Lower Leg. The wound measures 3.5cm length x 14.2cm width x 0.1cm depth; 39.034cm^2 area and 3.903cm^3 volume. There is no tunneling or undermining noted. There is a large amount of serosanguineous drainage noted. The wound margin is flat and intact. There is large (67-100%) red granulation within the wound bed. There is no necrotic tissue within the wound bed. The periwound skin appearance exhibited:  Hemosiderin Staining. The periwound skin appearance did not exhibit: Callus, Crepitus, Excoriation, Induration, Rash, Scarring, Dry/Scaly, Maceration, Atrophie Blanche, Cyanosis, Ecchymosis, Mottled, Pallor, Rubor, Erythema. Periwound temperature was noted as No Abnormality. Assessment Active Problems ICD-10 Non-pressure chronic ulcer of left calf with fat layer exposed Lymphedema, not elsewhere classified Obesity, unspecified Plan Wound Cleansing: Wound #13 Right Lower Leg: Clean wound with Normal Saline. May Shower, gently pat wound dry prior to applying new dressing. Skin Barriers/Peri-Wound Care: Wound #13 Right Lower Leg: Moisturizing lotion Primary Wound Dressing: Wound #13 Right Lower Leg: Hydrafera Blue Ready Transfer Secondary Dressing: Wound #13 Right Lower Leg: ABD pad Dressing Change Frequency: Wound #13 Right Lower Leg: Other: - HHRN to change wraps on Fridays Tuesdays at [place] Follow-up Appointments: Wound #13 Right Lower Leg: Return Appointment in 1 week. Edema Control: Wound #13 Right Lower Leg: 3 Layer  Compression System - Bilateral - unna to anchor Additional Orders / Instructions: Adam Merritt, Adam Merritt ([id number]) Wound #13 Right Lower Leg: Activity as tolerated Home Health: Wound #13 Right Lower Leg: Continue Home Health Visits - HHRN to change wraps on Fridays Home Health Nurse may visit PRN to address patient s wound care needs. FACE TO FACE ENCOUNTER: MEDICARE and MEDICAID PATIENTS: I certify that this patient is under my care and that I had a face-to-face encounter that meets the physician face-to-face encounter requirements with this patient on this date. The encounter with the patient was in whole or in part for the following MEDICAL CONDITION: (primary reason for [place]) MEDICAL NECESSITY: I certify, that based on my findings, NURSING services are a medically necessary home health service. HOME BOUND STATUS: I certify that  my clinical findings support that this patient is homebound (i.e., Due to illness or injury, pt requires aid of supportive devices such as crutches, cane, wheelchairs, walkers, the use of special transportation or the assistance of another person to leave their place of residence. There is a normal inability to leave the home and doing so requires considerable and taxing effort. Other absences are for medical reasons / religious services and are infrequent or of short duration when for other reasons). If current dressing causes regression in wound condition, may D/C ordered dressing product/s and apply Normal Saline Moist Dressing daily until next [place] / Other MD appointment. [place] of regression in wound condition at [phone]. Please direct any NON-WOUND related issues/requests for orders to patient's Primary Care Physician The following medication(s) was prescribed: lidocaine topical 4 % cream 1 1 cream topical was prescribed at facility I am going to recommend that we actually go ahead and continue with the Current wound care measures per above for the next week. The patient is in agreement with plan. We will see were things stand at follow-up. Please see above for specific wound care orders. We will see patient for re-evaluation in 1 week(s) here in the clinic. If anything worsens or changes patient will contact our office for additional recommendations. Electronic Signature(s) Signed: 12/05/2017 1:22:53 AM By: Worthy Keeler PA-C Entered By: Worthy Keeler on 12/05/2017 00:06:57 Adam Merritt ([id number]) -------------------------------------------------------------------------------- ROS/PFSH Details Patient Name: Adam Merritt Date of Service: 12/04/2017 1:45 PM Medical Record Number: [id number] Patient Account Number: [id number] Date of Birth/Sex: 1945-06-27 (72 y.o. M) Treating RN: Ahmed Prima Primary Care Provider: [name] Other  Clinician: Referring Provider: [name] Treating Provider/Extender: Melburn Hake, Rishav Rockefeller [name]: 6 Information Obtained From Patient Wound History Do you currently have one or more open woundso No Have you tested positive for osteomyelitis (bone infection)o No Have you had any tests for circulation on your legso Yes Who ordered the testo [facility] Where was the test doneo [facility] Have you had other problems associated with your woundso Swelling Constitutional Symptoms (General Health) Complaints and Symptoms: Negative for: Fever; Chills Cardiovascular Complaints and Symptoms: Positive for: LE edema Medical History: Positive for: Congestive Heart Failure; Coronary Artery Disease; Hypertension; Peripheral Venous Disease Negative for: Angina; Arrhythmia; Deep Vein Thrombosis; Hypotension; Myocardial Infarction; Peripheral Arterial Disease; Phlebitis; Vasculitis Past Medical History Notes: varicose veins Eyes Medical History: Negative for: Cataracts; Glaucoma; Optic Neuritis Ear/Nose/Mouth/Throat Medical History: Negative for: Chronic sinus problems/congestion; Middle ear problems Hematologic/Lymphatic Medical History: Positive for: Anemia - aplastic anemia; Lymphedema Negative for: Hemophilia; Human Immunodeficiency Virus; Sickle Cell Disease Respiratory Complaints and Symptoms: No Complaints or Symptoms Medical  History: Positive for: Chronic Obstructive Pulmonary Disease (COPD); Sleep Apnea Adam, Merritt ([id number]) Negative for: Aspiration; Asthma; Pneumothorax; Tuberculosis Past Medical History Notes: home O2 at night as needed Gastrointestinal Medical History: Negative for: Cirrhosis ; Colitis; Crohnos; Hepatitis A; Hepatitis B; Hepatitis C Endocrine Medical History: Positive for: Type II Diabetes Negative for: Type I Diabetes Time with diabetes: since 2006 Treated with: Insulin Blood sugar tested every day: Yes Tested : Blood sugar testing  results: Breakfast: 209 Genitourinary Medical History: Negative for: End Stage Renal Disease Immunological Medical History: Negative for: Lupus Erythematosus; Raynaudos; Scleroderma Integumentary (Skin) Medical History: Negative for: History of Burn; History of pressure wounds Musculoskeletal Medical History: Positive for: Osteoarthritis Negative for: Gout; Rheumatoid Arthritis; Osteomyelitis Neurologic Medical History: Positive for: Neuropathy Negative for: Dementia; Quadriplegia; Paraplegia; Seizure Disorder Past Medical History Notes: CVA - 1992 Oncologic Medical History: Positive for: Received Chemotherapy - last dose 2006 Negative for: Received Radiation Psychiatric Complaints and Symptoms: No Complaints or Symptoms Adam, Merritt ([id number]) Medical History: Negative for: Anorexia/bulimia; Confinement Anxiety Immunizations Pneumococcal Vaccine: Received Pneumococcal Vaccination: Yes Implantable Devices Hospitalization / Surgery History Name of Hospital Purpose of Hospitalization/Surgery Date [facility] bronchitis 11/03/2013 Family and Social History Cancer: Yes - Paternal Grandparents; Diabetes: No; Heart Disease: No; Hereditary Spherocytosis: No; Hypertension: No; Kidney Disease: Yes - Siblings; Lung Disease: Yes - Mother; Seizures: No; Stroke: No; Thyroid Problems: No; Tuberculosis: No; Never smoker; Marital Status - Married; Alcohol Use: Never; Drug Use: No History; Caffeine Use: Never; Financial Concerns: No; Food, Clothing or Shelter Needs: No; Support System Lacking: No; Transportation Concerns: No; Advanced Directives: No; Patient does not want information on Advanced Directives Physician Affirmation I have reviewed and agree with the above information. Electronic Signature(s) Signed: 12/05/2017 1:22:53 AM By: Worthy Keeler PA-C Signed: 12/05/2017 3:49:29 PM By: Alric Quan Entered By: Worthy Keeler on 12/05/2017 00:06:00 Adam Merritt  ([id number]) -------------------------------------------------------------------------------- SuperBill Details Patient Name: Adam Merritt Date of Service: 12/04/2017 Medical Record Number: [id number] Patient Account Number: [id number] Date of Birth/Sex: [birth year] (72 y.o. M) Treating RN: Ahmed Prima Primary Care Provider: [name] Other Clinician: Referring Provider: [name] Treating Provider/Extender: Melburn Hake, Elta Angell [name]: 6 Diagnosis Coding ICD-10 Codes Code Description [phone] Non-pressure chronic ulcer of left calf with fat layer exposed I89.0 Lymphedema, not elsewhere classified E66.9 Obesity, unspecified Facility Procedures CPT4: Description Modifier Quantity Code [id number] BILATERAL: Application of multi-layer venous compression system; leg (below 1 knee), including ankle and foot. Physician Procedures CPT4 Code: [id number] Description: 78938 - WC PHYS LEVEL 3 - EST PT ICD-10 Diagnosis Description L97.222 Non-pressure chronic ulcer of left calf with fat layer expo I89.0 Lymphedema, not elsewhere classified E66.9 Obesity, unspecified Modifier: sed Quantity: 1 Electronic Signature(s) Signed: 12/05/2017 1:22:53 AM By: Worthy Keeler PA-C Previous Signature: 12/04/2017 4:10:39 PM Version By: Alric Quan Entered By: Worthy Keeler on 12/05/2017 00:07:41

## 2017-12-07 ENCOUNTER — Other Ambulatory Visit: Payer: Self-pay | Admitting: Internal Medicine

## 2017-12-07 ENCOUNTER — Telehealth: Payer: Self-pay

## 2017-12-07 NOTE — Telephone Encounter (Signed)
Aldren- physical therapist with [facility]. Gave verbal order as per Nira Conn to continue home care and a nurse visit for wound care.

## 2017-12-09 ENCOUNTER — Other Ambulatory Visit: Payer: Self-pay

## 2017-12-09 ENCOUNTER — Encounter: Payer: Self-pay | Admitting: Emergency Medicine

## 2017-12-09 ENCOUNTER — Emergency Department
Admission: EM | Admit: 2017-12-09 | Discharge: 2017-12-09 | Disposition: A | Discharge status: Home / Self Care | Payer: Medicare Other | Attending: Emergency Medicine | Admitting: Emergency Medicine

## 2017-12-09 DIAGNOSIS — Z79899 Other long term (current) drug therapy: Secondary | ICD-10-CM | POA: Insufficient documentation

## 2017-12-09 DIAGNOSIS — I5032 Chronic diastolic (congestive) heart failure: Secondary | ICD-10-CM | POA: Insufficient documentation

## 2017-12-09 DIAGNOSIS — I2510 Atherosclerotic heart disease of native coronary artery without angina pectoris: Secondary | ICD-10-CM | POA: Insufficient documentation

## 2017-12-09 DIAGNOSIS — Z7982 Long term (current) use of aspirin: Secondary | ICD-10-CM | POA: Diagnosis not present

## 2017-12-09 DIAGNOSIS — E860 Dehydration: Secondary | ICD-10-CM

## 2017-12-09 DIAGNOSIS — I130 Hypertensive heart and chronic kidney disease with heart failure and stage 1 through stage 4 chronic kidney disease, or unspecified chronic kidney disease: Secondary | ICD-10-CM | POA: Diagnosis not present

## 2017-12-09 DIAGNOSIS — N183 Chronic kidney disease, stage 3 (moderate): Secondary | ICD-10-CM | POA: Diagnosis not present

## 2017-12-09 DIAGNOSIS — E119 Type 2 diabetes mellitus without complications: Secondary | ICD-10-CM | POA: Insufficient documentation

## 2017-12-09 DIAGNOSIS — K5289 Other specified noninfective gastroenteritis and colitis: Secondary | ICD-10-CM | POA: Diagnosis not present

## 2017-12-09 DIAGNOSIS — K529 Noninfective gastroenteritis and colitis, unspecified: Secondary | ICD-10-CM

## 2017-12-09 DIAGNOSIS — Z8673 Personal history of transient ischemic attack (TIA), and cerebral infarction without residual deficits: Secondary | ICD-10-CM | POA: Insufficient documentation

## 2017-12-09 DIAGNOSIS — Z794 Long term (current) use of insulin: Secondary | ICD-10-CM | POA: Insufficient documentation

## 2017-12-09 LAB — URINALYSIS, COMPLETE (UACMP) WITH MICROSCOPIC
Bacteria, UA: NONE SEEN
Bilirubin Urine: NEGATIVE
Glucose, UA: NEGATIVE mg/dL
Hgb urine dipstick: NEGATIVE
Ketones, ur: NEGATIVE mg/dL
Leukocytes, UA: NEGATIVE
Nitrite: NEGATIVE
Protein, ur: NEGATIVE mg/dL
Specific Gravity, Urine: 1.008 (ref 1.005–1.030)
pH: 7 (ref 5.0–8.0)

## 2017-12-09 LAB — CBC
HCT: 39.7 % — ABNORMAL LOW (ref 40.0–52.0)
HEMOGLOBIN: 14.1 g/dL (ref 13.0–18.0)
MCH: 33.3 pg (ref 26.0–34.0)
MCHC: 35.6 g/dL (ref 32.0–36.0)
MCV: 93.5 fL (ref 80.0–100.0)
PLATELETS: 161 10*3/uL (ref 150–440)
RBC: 4.24 MIL/uL — AB (ref 4.40–5.90)
RDW: 13.3 % (ref 11.5–14.5)
WBC: 8.6 10*3/uL (ref 3.8–10.6)

## 2017-12-09 LAB — COMPREHENSIVE METABOLIC PANEL
ALBUMIN: 3.7 g/dL (ref 3.5–5.0)
ALK PHOS: 69 U/L (ref 38–126)
ALT: 17 U/L (ref 0–44)
ANION GAP: 10 (ref 5–15)
AST: 30 U/L (ref 15–41)
BILIRUBIN TOTAL: 1 mg/dL (ref 0.3–1.2)
BUN: 20 mg/dL (ref 8–23)
CALCIUM: 9.1 mg/dL (ref 8.9–10.3)
CO2: 28 mmol/L (ref 22–32)
CREATININE: 1.45 mg/dL — AB (ref 0.61–1.24)
Chloride: 98 mmol/L (ref 98–111)
GFR calc Af Amer: 54 mL/min — ABNORMAL LOW (ref >60)
GFR calc non Af Amer: 46 mL/min — ABNORMAL LOW (ref >60)
Glucose, Bld: 120 mg/dL — ABNORMAL HIGH (ref 70–99)
Potassium: 3.4 mmol/L — ABNORMAL LOW (ref 3.5–5.1)
Sodium: 136 mmol/L (ref 135–145)
Total Protein: 7.6 g/dL (ref 6.5–8.1)

## 2017-12-09 LAB — LIPASE, BLOOD: Lipase: 29 U/L (ref 11–51)

## 2017-12-09 MED ORDER — SODIUM CHLORIDE 0.9 % IV SOLN: 1000.0000 mL | Freq: Once | INTRAVENOUS | Status: DC

## 2017-12-09 NOTE — ED Notes (Signed)
Pt had fecal matter on him, pt was changed a brief was placed and peri care was preformed. Pt was repositioned and awaiting EDP.

## 2017-12-09 NOTE — ED Notes (Signed)
Pt presents with diarrhea started today, pt states he has had 1 loose stool today. Wife at bedside.

## 2017-12-09 NOTE — ED Notes (Signed)
.   Pt is resting, Respirations even and unlabored, NAD. Stretcher lowest postion and locked. Call bell within reach. Denies any needs at this time RN will continue to monitor.    

## 2017-12-09 NOTE — ED Provider Notes (Signed)
[facility] Emergency Department Provider Note   ____________________________________________    I have reviewed the triage vital signs and the nursing notes.   HISTORY  Chief Complaint Dehydration and Diarrhea     HPI Adam Merritt is a 72 y.o. male with past medical history as detailed below presents with complaints of nausea, decreased p.o. intake and diarrhea over the last 24 hours.  Patient denies abdominal pain.  He states he feels somewhat wiped out.  He states he has had to come to the emergency department before for dehydration and feels dehydrated.  No chest pain palpitations shortness of breath.  No other complaints   Past Medical History:  Diagnosis Date  . Anemia   . CAD (coronary artery disease)   . Chest pain   . Coronary artery disease   . Diabetes mellitus without complication ([place])   . Esophageal reflux   . Herpes zoster without mention of complication   . Hyperlipidemia   . Hypertension   . Insomnia   . Lumbago   . Neuropathy in diabetes ([place])   . Obstructive chronic bronchitis without exacerbation ([place])   . Other malaise and fatigue   . Stroke ([place])   . Varicose veins     Patient Active Problem List   Diagnosis Date Noted  . Wheezing 10/25/2017  . Cellulitis and abscess of lower extremity 09/20/2017  . Edema of both lower legs due to peripheral venous insufficiency 09/20/2017  . Cellulitis 09/20/2017  . Atopic dermatitis 09/02/2017  . Chronic pain disorder 09/02/2017  . Acute gout of right knee 08/31/2017  . AKI (acute kidney injury) ([place]) 08/29/2017  . Other malaise and fatigue 07/09/2017  . CVA (cerebral vascular accident) ([place]) 09/15/2016  . 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 CVA (cerebral infarction) 02/12/2015  . Chronic diastolic CHF (congestive heart failure), NYHA class 3 ([place]) 11/30/2014  . Benign essential hypertension 11/06/2014  . CAD (coronary artery disease) of artery bypass graft  01/19/2014  . CKD (chronic kidney disease), stage III ([place]) 01/19/2014  . Acute bronchitis with COPD ([place]) 01/19/2014  . Diabetic neuropathy ([place]) 01/19/2014  . Insulin dependent diabetes mellitus ([place]) 01/19/2014  . OSA (obstructive sleep apnea) 01/19/2014  . Mixed hyperlipidemia 10/29/2013  . Peripheral vascular disease, unspecified ([place]) 09/29/2013    Past Surgical History:  Procedure Laterality Date  . CHOLECYSTECTOMY    . CORONARY ARTERY BYPASS GRAFT      Prior to Admission medications   Medication Sig Start Date End Date Taking? Authorizing Provider  albuterol (PROVENTIL HFA;VENTOLIN HFA) 108 (90 Base) MCG/ACT inhaler Inhale 1 puff into the lungs every 6 (six) hours as needed for wheezing or shortness of breath. 09/10/17   [name], MD  amoxicillin-clavulanate (AUGMENTIN) 875-125 MG tablet Take 1 tablet by mouth 2 (two) times daily. Patient not taking: Reported on 10/25/2017 09/25/17   [name], MD  aspirin 325 MG EC tablet Take 325 mg by mouth daily.    [provider]  atorvastatin (LIPITOR) 40 MG tablet Take 40 mg by mouth at bedtime.     [provider]  carvedilol (COREG) 25 MG tablet Take 25 mg by mouth 2 (two) times daily with a meal.    [provider]  colchicine 0.6 MG tablet Take 1 tablet (0.6 mg total) by mouth daily. Patient not taking: Reported on 11/27/2017 08/31/17 08/31/18  Dustin Flock, MD  docusate sodium (COLACE) 50 MG capsule Take 50 mg by mouth 2 (two)  times daily.    [provider]  febuxostat (ULORIC) 40 MG tablet Take one tab a day for gout, cannot take allopurinol Patient taking differently: Take 40 mg by mouth daily.  09/04/17   [name], MD  fentaNYL (DURAGESIC - DOSED MCG/HR) 50 MCG/HR Place 1 patch (50 mcg total) onto the skin every 3 (three) days. 11/27/17   [name], MD  furosemide (LASIX) 40 MG tablet Take one tablet (40mg ) by mouth daily 09/24/17   Ronnell Freshwater, NP  gabapentin (NEURONTIN) 400  MG capsule Take 400 mg by mouth 3 (three) times daily.     [provider]  hydrALAZINE (APRESOLINE) 25 MG tablet Take 25 mg by mouth 3 (three) times daily.    [provider]  ibuprofen (ADVIL,MOTRIN) 600 MG tablet Take 600 mg by mouth 2 (two) times daily as needed for mild pain or moderate pain.     [provider]  indapamide (LOZOL) 2.5 MG tablet Take 1 tablet (2.5 mg total) by mouth daily. 11/27/17   [name], MD  Insulin Glargine [facility]) 100 UNIT/ML SOPN Inject 30 Units into the skin at bedtime.    [provider]  ipratropium-albuterol (DUONEB) 0.5-2.5 (3) MG/3ML SOLN Take 3 mLs by nebulization every 4 (four) hours as needed (for shortness of breath). 09/10/17   [name], MD  levofloxacin (LEVAQUIN) 750 MG tablet Take 1 tablet (750 mg total) by mouth daily. 10/22/17   Ronnell Freshwater, NP  Liraglutide (VICTOZA) 18 MG/3ML SOPN Inject 1.8 mg into the skin daily.     [provider]  methylPREDNISolone (MEDROL) 4 MG TBPK tablet Take by mouth as directed for 6 days 10/25/17   Ronnell Freshwater, NP  metolazone (ZAROXOLYN) 2.5 MG tablet Take 1 tablet (2.5 mg total) by mouth daily. 11/27/17   [name], MD  Multiple Vitamin (MULTIVITAMIN WITH MINERALS) TABS tablet Take 1 tablet by mouth daily.    [provider]  omeprazole (PRILOSEC) 20 MG capsule Take 20 mg by mouth daily.    [provider]  Oxycodone HCl 10 MG TABS Half to one tab as needed prn for pain Patient taking differently: Half to one tab  prn for pain 11/28/17   [name], MD  pentoxifylline (TRENTAL) 400 MG CR tablet TAKE 1 TABLET BY MOUTH THREE TIMES DAILY FOR CLAUDICATION 12/07/17   [name], MD  potassium chloride SA (K-DUR,KLOR-CON) 20 MEQ tablet Take 20 mEq by mouth 2 (two) times daily.    [provider]  sennosides-docusate sodium (SENOKOT-S) 8.6-50 MG tablet Take 1-2 tablets by mouth 2 (two) times daily.    [provider]    UREA 20 INTENSIVE HYDRATING 20 % cream USE AS DIRECTED 3 TIMES A WEEK 09/12/17   [name], MD  zolpidem (AMBIEN CR) 12.5 MG CR tablet Take 1 tablet (12.5 mg total) by mouth at bedtime as needed for sleep. 11/02/17   Ronnell Freshwater, NP     Allergies Patient has no known allergies.  Family History  Problem Relation Age of Onset  . Diabetes Mother   . Hyperlipidemia Mother   . Hypertension Mother   . Diabetes Father   . Hyperlipidemia Father   . Hypertension Father     Social History Social History   Tobacco Use  . Smoking status: Never Smoker  . Smokeless tobacco: Never Used  Substance Use Topics  . Alcohol use: No  . Drug use:  No    Review of Systems  Constitutional: No fever/chills Eyes: No visual changes.  ENT: No sore throat. Cardiovascular: Denies chest pain. Respiratory: Denies shortness of breath. Gastrointestinal: No abdominal pain.  As above.  Genitourinary: Negative for dysuria. Musculoskeletal: Negative for back pain. Skin: Negative for rash. Neurological: Negative for headaches    ____________________________________________   PHYSICAL EXAM:  VITAL SIGNS: ED Triage Vitals  Enc Vitals Group     BP 12/09/17 1216 (!) 154/89     Pulse Rate 12/09/17 1216 77     Resp 12/09/17 1216 18     Temp 12/09/17 1216 98.9 F (37.2 C)     Temp Source 12/09/17 1216 Oral     SpO2 12/09/17 1216 93 %     Weight 12/09/17 1217 134.3 kg (296 lb)     Height 12/09/17 1217 1.676 m (5\' 6" )     Head Circumference --      Peak Flow --      Pain Score 12/09/17 1217 0     Pain Loc --      Pain Edu? --      Excl. in [place]? --     Constitutional: Alert and oriented. Eyes: Conjunctivae are normal.   Nose: No congestion/rhinnorhea. Mouth/Throat: Mucous membranes are moist.   Neck:  Painless ROM Cardiovascular: Normal rate, regular rhythm. Grossly normal heart sounds.  Good peripheral circulation. Respiratory: Normal respiratory effort.  No  retractions. Gastrointestinal: Soft and nontender. No distention.   Genitourinary: deferred Musculoskeletal:  Warm and well perfused Neurologic:  Normal speech and language. No gross focal neurologic deficits are appreciated.  Skin:  Skin is warm, dry and intact. No rash noted. Psychiatric: Mood and affect are normal. Speech and behavior are normal.  ____________________________________________   LABS (all labs ordered are listed, but only abnormal results are displayed)  Labs Reviewed  COMPREHENSIVE METABOLIC PANEL - Abnormal; Notable for the following components:      Result Value   Potassium 3.4 (*)    Glucose, Bld 120 (*)    Creatinine, Ser 1.45 (*)    GFR calc non Af Amer 46 (*)    GFR calc Af Amer 54 (*)    All other components within normal limits  CBC - Abnormal; Notable for the following components:   RBC 4.24 (*)    HCT 39.7 (*)    All other components within normal limits  URINALYSIS, COMPLETE (UACMP) WITH MICROSCOPIC - Abnormal; Notable for the following components:   Color, Urine YELLOW (*)    APPearance CLEAR (*)    All other components within normal limits  LIPASE, BLOOD   ____________________________________________  EKG  None ____________________________________________  RADIOLOGY  None ____________________________________________   PROCEDURES  Procedure(s) performed: No  Procedures   Critical Care performed: No ____________________________________________   INITIAL IMPRESSION / ASSESSMENT AND PLAN / ED COURSE  Pertinent labs & imaging results that were available during my care of the patient were reviewed by me and considered in my medical decision making (see chart for details).  Patient overall well-appearing in no acute distress, vital signs unremarkable.  Reassuring exam.  No abdominal tenderness to palpation.  Nausea decreased p.o. intake and diarrhea, suspect viral gastroenteritis versus possible food related illness.  We will give  IV fluids and reevaluate  Work is overall quite reassuring, mildly elevated creatinine, patient is receiving IV fluids.  ----------------------------------------- 2:59 PM on 12/09/2017 -----------------------------------------  Patient is feeling better after receiving minimal IV fluids, will give an additional 250 cc  and prepare for discharge    ____________________________________________   FINAL CLINICAL IMPRESSION(S) / ED DIAGNOSES  Final diagnoses:  Dehydration  Gastroenteritis        Note:  This document was prepared using Dragon voice recognition software and may include unintentional dictation errors.    Lavonia Drafts, MD 12/09/17 1500

## 2017-12-09 NOTE — ED Notes (Signed)
Assisted pt to use urinal while lying in bed. Sent urine sample off to lab.

## 2017-12-09 NOTE — ED Triage Notes (Signed)
Pt arrived via POV with reports of dehydration for the past 3 days. Pt also reports several episodes of diarrhea over the past few days. One episode today, none yesterday.  Pt also c/o nausea and dry heaves.  Pt states he has had a decreased appetite, but has been able to keep water down

## 2017-12-11 ENCOUNTER — Encounter: Payer: Medicare Other | Admitting: Physician Assistant

## 2017-12-11 DIAGNOSIS — E11622 Type 2 diabetes mellitus with other skin ulcer: Secondary | ICD-10-CM | POA: Diagnosis not present

## 2017-12-12 NOTE — Progress Notes (Signed)
GRIFFYN, KUCINSKI ([id number]) Visit Report for 12/11/2017 Arrival Information Details Patient Name: Adam Merritt Date of Service: 12/11/2017 9:15 AM Medical Record Number: [id number] Patient Account Number: [id number] Date of Birth/Sex: Feb 19, 1946 (72 y.o. M) Treating RN: [name] Ginger Primary Care Orvan Papadakis: [name] Other Clinician: Referring Rayshaun Needle: [name] Treating Wilfrido Luedke/Extender: Melburn Hake, [name]: 7 Visit Information History Since Last Visit Added or deleted any medications: No Patient Arrived: Ambulatory Any new allergies or adverse reactions: No Arrival Time: 09:14 Had a fall or experienced change in No Accompanied By: wife activities of daily living that may affect Transfer Assistance: None risk of falls: Patient Has Alerts: Yes Signs or symptoms of abuse/neglect since last visito No Patient Alerts: DMII Hospitalized since last visit: No Implantable device outside of the clinic excluding No cellular tissue based products placed in the center since last visit: Has Dressing in Place as Prescribed: No Pain Present Now: No Electronic Signature(s) Signed: 12/11/2017 4:03:40 PM By: [name] Ginger Entered By: [name] Ginger on 12/11/2017 09:15:08 Adam Merritt ([id number]) -------------------------------------------------------------------------------- Encounter Discharge Information Details Patient Name: Adam Merritt Date of Service: 12/11/2017 9:15 AM Medical Record Number: [id number] Patient Account Number: [id number] Date of Birth/Sex: 02-06-1946 (72 y.o. M) Treating RN: Roger Shelter Primary Care Marguerite Barba: [name] Other Clinician: Referring Jian Hodgman: [name] Treating Emiyah Spraggins/Extender: Melburn Hake, [name]: 7 Encounter Discharge Information Items Discharge Condition: Stable Ambulatory Status: Cane Discharge Destination: Home Transportation: Private Auto Schedule Follow-up Appointment: Yes Clinical Summary of Care: Electronic  Signature(s) Signed: 12/11/2017 11:46:23 AM By: Roger Shelter Entered By: Roger Shelter on 12/11/2017 70:01:74 Adam Merritt ([id number]) -------------------------------------------------------------------------------- Lower Extremity Assessment Details Patient Name: Adam Merritt Date of Service: 12/11/2017 9:15 AM Medical Record Number: [id number] Patient Account Number: [id number] Date of Birth/Sex: May 13, 1946 (72 y.o. M) Treating RN: [name] Ginger Primary Care Rhina Kramme: [name] Other Clinician: Referring Aldean Suddeth: [name] Treating Taylon Louison/Extender: Melburn Hake, [name]: 7 Edema Assessment Assessed: [Left: No] [Right: No] Edema: [Left: Yes] [Right: Yes] Calf Left: Right: Point of Measurement: 32 cm From Medial Instep 53.5 cm 50.5 cm Ankle Left: Right: Point of Measurement: 11 cm From Medial Instep 41 cm 34 cm Vascular Assessment Claudication: Claudication Assessment [Left:None] [Right:None] Pulses: Dorsalis Pedis Palpable: [Left:Yes] [Right:Yes] Posterior Tibial Extremity colors, hair growth, and conditions: Extremity Color: [Left:Hyperpigmented] [Right:Hyperpigmented] Hair Growth on Extremity: [Left:No] [Right:No] Temperature of Extremity: [Left:Cool] [Right:Cool] Capillary Refill: [Left:< 3 seconds] [Right:< 3 seconds] Toe Nail Assessment Left: Right: Thick: Yes Yes Discolored: Yes Yes Deformed: No No Improper Length and Hygiene: No No Notes heel to knee- 46cm Electronic Signature(s) Signed: 12/11/2017 4:03:40 PM By: [name] Ginger Signed: 12/11/2017 5:13:20 PM By: Alric Quan Entered By: Alric Quan on 12/11/2017 10:01:39 Adam Merritt ([id number]) -------------------------------------------------------------------------------- Multi Wound Chart Details Patient Name: Adam Merritt Date of Service: 12/11/2017 9:15 AM Medical Record Number: [id number] Patient Account Number: [id number] Date of Birth/Sex: 1945-11-22 (72 y.o. M) Treating  RN: Ahmed Prima Primary Care Jakira Mcfadden: [name] Other Clinician: Referring Amel Kitch: [name] Treating Janise Gora/Extender: Melburn Hake, [name]: 7 Vital Signs Height(in): 66 Pulse(bpm): 71 Weight(lbs): 298 Blood Pressure(mmHg): 148/79 Body Mass Index(BMI): 48 Temperature(F): 98.2 Respiratory Rate 18 (breaths/min): Photos: [N/A:N/A] Wound Location: Right Lower Leg N/A N/A Wounding Event: Gradually Appeared N/A N/A Primary Etiology: Lymphedema N/A N/A Comorbid History: Anemia, Lymphedema, N/A N/A Chronic Obstructive Pulmonary Disease (COPD), Sleep Apnea, Congestive Heart Failure, Coronary Artery Disease, Hypertension, Peripheral Venous Disease, Type II Diabetes, Osteoarthritis, Neuropathy, Received  Chemotherapy Date Acquired: 08/20/2017 N/A N/A Weeks of Treatment: 2 N/A N/A Wound Status: Open N/A N/A Clustered Wound: Yes N/A N/A Measurements L x W x D 2x3.3x0.1 N/A N/A (cm) Area (cm) : 5.184 N/A N/A Volume (cm) : 0.518 N/A N/A % Reduction in Area: 88.30% N/A N/A % Reduction in Volume: 88.30% N/A N/A Classification: Partial Thickness N/A N/A Exudate Amount: Small N/A N/A Exudate Type: Serous N/A N/A Exudate Color: amber N/A N/A Wound Margin: Flat and Intact N/A N/A Granulation Amount: Large (67-100%) N/A N/A Adam Merritt ([id number]) Granulation Quality: Red N/A N/A Necrotic Amount: None Present (0%) N/A N/A Exposed Structures: Fascia: No N/A N/A Fat Layer (Subcutaneous Tissue) Exposed: No Tendon: No Muscle: No Joint: No Bone: No Epithelialization: Medium (34-66%) N/A N/A Periwound Skin Texture: Excoriation: No N/A N/A Induration: No Callus: No Crepitus: No Rash: No Scarring: No Periwound Skin Moisture: Maceration: No N/A N/A Dry/Scaly: No Periwound Skin Color: Hemosiderin Staining: Yes N/A N/A Atrophie Blanche: No Cyanosis: No Ecchymosis: No Erythema: No Mottled: No Pallor: No Rubor: No Temperature: No Abnormality  N/A N/A Tenderness on Palpation: No N/A N/A Wound Preparation: Ulcer Cleansing: Other: soap N/A N/A and water Topical Anesthetic Applied: Other: lidocaine 4% Treatment Notes Electronic Signature(s) Signed: 12/11/2017 5:13:20 PM By: Alric Quan Entered By: Alric Quan on 12/11/2017 09:52:11 BROLY, HATFIELD ([id number]) -------------------------------------------------------------------------------- [place] Details Patient Name: Adam Merritt Date of Service: 12/11/2017 9:15 AM Medical Record Number: [id number] Patient Account Number: [id number] Date of Birth/Sex: Jun 04, 1946 (72 y.o. M) Treating RN: Ahmed Prima Primary Care Clotiel Troop: [name] Other Clinician: Referring Colie Josten: [name] Treating Tirzah Fross/Extender: Melburn Hake, [name]: 7 Active Inactive ` Abuse / Safety / Falls / Self Care Management Nursing Diagnoses: Potential for falls Goals: Patient will not experience any injury related to falls Date Initiated: 10/18/2017 Target Resolution Date: 02/09/2018 Goal Status: Active Interventions: Assess Activities of Daily Living upon admission and as needed Assess fall risk on admission and as needed Assess: immobility, friction, shearing, incontinence upon admission and as needed Assess impairment of mobility on admission and as needed per policy Assess personal safety and home safety (as indicated) on admission and as needed Assess self care needs on admission and as needed Notes: ` Orientation to the Wound Care Program Nursing Diagnoses: Knowledge deficit related to the wound healing center program Goals: Patient/caregiver will verbalize understanding of the [facility] Date Initiated: 10/18/2017 Target Resolution Date: 11/10/2017 Goal Status: Active Interventions: Provide education on orientation to the wound center Notes: ` Wound/Skin Impairment Nursing Diagnoses: Impaired tissue  integrity Knowledge deficit related to ulceration/compromised skin integrity ZHAIRE, LOCKER ([id number]) Goals: Ulcer/skin breakdown will have a volume reduction of 80% by week 12 Date Initiated: 10/18/2017 Target Resolution Date: 01/05/2018 Goal Status: Active Interventions: Assess patient/caregiver ability to perform ulcer/skin care regimen upon admission and as needed Assess ulceration(s) every visit Notes: Electronic Signature(s) Signed: 12/11/2017 5:13:20 PM By: Alric Quan Entered By: Alric Quan on 12/11/2017 09:52:02 Adam Merritt ([id number]) -------------------------------------------------------------------------------- Pain Assessment Details Patient Name: Adam Merritt Date of Service: 12/11/2017 9:15 AM Medical Record Number: [id number] Patient Account Number: [id number] Date of Birth/Sex: 04-10-1946 (72 y.o. M) Treating RN: [name] Ginger Primary Care Mohmmad Saleeby: [name] Other Clinician: Referring Rithika Seel: [name] Treating Lewis Keats/Extender: Melburn Hake, [name]: 7 Active Problems Location of Pain Severity and Description of Pain Patient Has Paino No Site Locations Pain Management and Medication Current Pain Management: Goals for Pain Management Topical or  injectable lidocaine is offered to patient for acute pain when surgical debridement is performed. If needed, Patient is instructed to use over the counter pain medication for the following 24-48 hours after debridement. Wound care MDs do not prescribed pain medications. Patient has chronic pain or uncontrolled pain. Patient has been instructed to make an appointment with their Primary Care Physician for pain management. Electronic Signature(s) Signed: 12/11/2017 4:03:40 PM By: [name] Ginger Entered By: [name] Ginger on 12/11/2017 09:18:13 Adam Merritt ([id number]) -------------------------------------------------------------------------------- Patient/Caregiver Education Details Patient Name:  Adam Merritt Date of Service: 12/11/2017 9:15 AM Medical Record Number: [id number] Patient Account Number: [id number] Date of Birth/Gender: [birth year] (72 y.o. M) Treating RN: Roger Shelter Primary Care Physician: [name] Other Clinician: Referring Physician: [name] Treating Physician/Extender: Sharalyn Ink in Treatment: 7 Education Assessment Education Provided To: Patient Education Topics Provided Wound Debridement: Handouts: Wound Debridement Methods: Explain/Verbal Responses: State content correctly Wound/Skin Impairment: Handouts: Caring for Your Ulcer Methods: Explain/Verbal Responses: State content correctly Electronic Signature(s) Signed: 12/11/2017 11:46:23 AM By: Roger Shelter Entered By: Roger Shelter on 12/11/2017 10:28:43 Adam Merritt ([id number]) -------------------------------------------------------------------------------- Wound Assessment Details Patient Name: Adam Merritt Date of Service: 12/11/2017 9:15 AM Medical Record Number: [id number] Patient Account Number: [id number] Date of Birth/Sex: 1946/05/10 (72 y.o. M) Treating RN: [name] Ginger Primary Care Cowan Pilar: [name] Other Clinician: Referring Tarique Loveall: [name] Treating Nikola Blackston/Extender: STONE III, [name]: 7 Wound Status Wound Number: 13 Primary Lymphedema Etiology: Wound Location: Right Lower Leg Wound Open Wounding Event: Gradually Appeared Status: Date Acquired: 08/20/2017 Comorbid Anemia, Lymphedema, Chronic Obstructive Weeks Of Treatment: 2 History: Pulmonary Disease (COPD), Sleep Apnea, Clustered Wound: Yes Congestive Heart Failure, Coronary Artery Disease, Hypertension, Peripheral Venous Disease, Type II Diabetes, Osteoarthritis, Neuropathy, Received Chemotherapy Photos Photo Uploaded By: [name] Ginger on 12/11/2017 09:29:53 Wound Measurements Length: (cm) 2 Width: (cm) 3.3 Depth: (cm) 0.1 Area: (cm) 5.184 Volume: (cm) 0.518 %  Reduction in Area: 88.3% % Reduction in Volume: 88.3% Epithelialization: Medium (34-66%) Tunneling: No Undermining: No Wound Description Classification: Partial Thickness Foul Odor A Wound Margin: Flat and Intact Slough/Fibr Exudate Amount: Small Exudate Type: Serous Exudate Color: amber fter Cleansing: No ino No Wound Bed Granulation Amount: Large (67-100%) Exposed Structure Granulation Quality: Red Fascia Exposed: No Necrotic Amount: None Present (0%) Fat Layer (Subcutaneous Tissue) Exposed: No Tendon Exposed: No Muscle Exposed: No Joint Exposed: No TREVER, STREATER ([id number]) Bone Exposed: No Periwound Skin Texture Texture Color No Abnormalities Noted: No No Abnormalities Noted: No Callus: No Atrophie Blanche: No Crepitus: No Cyanosis: No Excoriation: No Ecchymosis: No Induration: No Erythema: No Rash: No Hemosiderin Staining: Yes Scarring: No Mottled: No Pallor: No Moisture Rubor: No No Abnormalities Noted: No Dry / Scaly: No Temperature / Pain Maceration: No Temperature: No Abnormality Wound Preparation Ulcer Cleansing: Other: soap and water, Topical Anesthetic Applied: Other: lidocaine 4%, Treatment Notes Wound #13 (Right Lower Leg) 1. Cleansed with: Clean wound with Normal Saline 2. Anesthetic Topical Lidocaine 4% cream to wound bed prior to debridement 3. Peri-wound Care: Moisturizing lotion 4. Dressing Applied: Hydrafera Blue 5. Secondary Dressing Applied ABD Pad 7. Secured with 3 Layer Compression System - Bilateral Electronic Signature(s) Signed: 12/11/2017 4:03:40 PM By: [name] Ginger Entered By: [name] Ginger on 12/11/2017 99:24:26 Adam Merritt ([id number]) -------------------------------------------------------------------------------- [place] Details Patient Name: Adam Merritt Date of Service: 12/11/2017 9:15 AM Medical Record Number: [id number] Patient Account Number: [id number] Date of Birth/Sex: November 28, 1945 (72 y.o. M) Treating RN:  [name] Ginger Primary Care Montell Leopard:  Humphrey Rolls, [place].Rei Other Clinician: Referring Jasun Gasparini: [name] Treating Tytan Sandate/Extender: STONE III, [name]: 7 Vital Signs Time Taken: 09:10 Temperature (F): 98.2 Height (in): 66 Pulse (bpm): 71 Weight (lbs): 298 Respiratory Rate (breaths/min): 18 Body Mass Index (BMI): 48.1 Blood Pressure (mmHg): 148/79 Reference Range: 80 - 120 mg / dl Electronic Signature(s) Signed: 12/11/2017 4:03:40 PM By: [name] Ginger Entered BySecundino Ginger on 12/11/2017 09:18:49

## 2017-12-12 NOTE — Progress Notes (Signed)
Adam, Merritt ([id number]) Visit Report for 12/11/2017 Chief Complaint Document Details Patient Name: Adam Merritt, Adam Merritt Date of Service: 12/11/2017 9:15 AM Medical Record Number: [id number] Patient Account Number: [id number] Date of Birth/Sex: Dec 15, 1945 (72 y.o. M) Treating RN: Ahmed Prima Primary Care Provider: [name] Other Clinician: Referring Provider: [name] Treating Provider/Extender: Melburn Hake, [name]: 7 Information Obtained from: Patient Chief Complaint he is here for evaluation of bilateral lower extremity lymphedema/venous ulcers Electronic Signature(s) Signed: 12/11/2017 11:53:57 PM By: Worthy Keeler PA-C Entered By: Worthy Keeler on 12/11/2017 09:11:21 Adam Merritt ([id number]) -------------------------------------------------------------------------------- HPI Details Patient Name: Adam Merritt Date of Service: 12/11/2017 9:15 AM Medical Record Number: [id number] Patient Account Number: [id number] Date of Birth/Sex: [birth year] (72 y.o. M) Treating RN: Ahmed Prima Primary Care Provider: [name] Other Clinician: Referring Provider: [name] Treating Provider/Extender: Melburn Hake, [name]: 7 History of Present Illness Location: bilateral lower extremity Quality: no pain to ulcerations Duration: ulcerations have been present 3-4 weeks Context: ulcerations of secondary to edema, blister formation HPI Description: 10/18/17-He is here for initial evaluation of bilateral lower extremity ulcerations in the presence of venous insufficiency and lymphedema. He has been seen by vascular medicine in the past, Dr. Lucky Cowboy, last seen in 2016. He does have a history of abnormal ABIs, which is to be expected given his lymphedema and venous insufficiency. According to Epic, it appears that all attempts for arterial evaluation and/or angiography were not follow through with by patient. He does have a history of being seen in lymphedema  clinic in 2018, stopped going approximately 6 months ago stating "it didn't do any good". He does not have lymphedema pumps, he does not have custom fit compression wrap/stockings. He is diabetic and his recent A1c last month was 7.6. He admits to chronic bilateral lower extremity pain, no change in pain since blister and ulceration development. He is currently being treated with Levaquin for bronchitis. He has home health and we will continue. 10/25/17-He is here in follow-up evaluation for bilateral lower extremity ulcerationssubtle he remains on Levaquin for bronchitis. Right lower extremity with no evidence of drainage or ulceration, persistent left lower extremity ulceration. He states that home health has not been out since his appointment. He went to [facility] on Tuesday, studies revealed: RIGHT ABI 0.9, TBI 0.6 LEFT ABI 1.1, TBI 0.6 with triphasic flow bilaterally. We will continue his same treatment plan. He has been educated on compression therapy and need for elevation. He will benefit from lymphedema pumps 11/01/17-He is here in follow-up evaluation for left lower extremity ulcer. The right lower extremity remains healed. He has home health services, but they have not been out to see the patient for 2-3 weeks. He states it home health physical therapy changed his dressing yesterday after therapy; he placed Ace wrap compression. We are still waiting for lymphedema pumps, reordered d/t need for company change. 11/08/17-He is here in follow-up evaluation for left lower extremity ulcer. It is improved. Edema is significantly improved with compression therapy. We will continue with same treatment plan and he will follow-up next week. No word regarding lymphedema pumps 11/15/17-He is here in follow-up evaluation for left lower extremity ulcer. He is healed and will be discharged from wound care services. I have reached out to [facility] regarding his lymphedema pumps. They  have been unable to reach the patient; the contact number they had with the patient's wife's cell phone and she has not answered  any unrecognized calls. Contact should be made today, trial planned for next week; [facility] will continue to follow 11/27/17 on evaluation today patient has multiple blistered areas over the right lower extremity his left lower extremity appears to be doing okay. These blistered areas show signs of no infection which is great news. With that being said he did have some necrotic skin overlying which was mechanically debrided away with saline and gauze today without complication. Overall post debridement the wounds appear to be doing better but in general his swelling seems to be increased. This is obviously not good news. I think this is what has given rise to the blisters. 12/04/17 on evaluation today patient presents for follow-up concerning his bilateral lower extremity edema in the right lower extremity ulcers. He has been tolerating the dressing changes without complication. With that being said he has had no real issues with the wraps which is also good news. Overall I'm pleased with the progress he's been making. 12/11/17 on evaluation today patient appears to be doing rather well in regard to his right lateral lower extremity ulcer. He's been tolerating the dressing changes without complication. Fortunately there does not appear to be any evidence of infection at this time. Overall I'm pleased with the progress that is being made. Unfortunately he has been in the hospital due to having what sounds to be a stomach virus/flu fortunately that is starting to get better. Electronic Signature(s) Signed: 12/11/2017 11:53:57 PM By: Melynda Ripple ([id number]) Entered By: Worthy Keeler on 12/11/2017 10:12:37 Adam Merritt ([id number]) -------------------------------------------------------------------------------- Physical Exam Details Patient  Name: Adam Merritt Date of Service: 12/11/2017 9:15 AM Medical Record Number: [id number] Patient Account Number: [id number] Date of Birth/Sex: 05-25-1946 (72 y.o. M) Treating RN: Ahmed Prima Primary Care Provider: [name] Other Clinician: Referring Provider: [name] Treating Provider/Extender: STONE III, [name]: 7 Constitutional Obese and well-hydrated in no acute distress. Respiratory normal breathing without difficulty. clear to auscultation bilaterally. Cardiovascular regular rate and rhythm with normal S1, S2. Psychiatric this patient is able to make decisions and demonstrates good insight into disease process. Alert and Oriented x 3. pleasant and cooperative. Notes On evaluation today patient's wound actually showed good granulation that did not appear to be any evidence of infection no slough noted at this point. 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 Electronic Signature(s) Signed: 12/11/2017 11:53:57 PM By: Worthy Keeler PA-C Entered By: Worthy Keeler on 12/11/2017 10:13:37 Adam Merritt ([id number]) -------------------------------------------------------------------------------- Physician Orders Details Patient Name: Adam Merritt Date of Service: 12/11/2017 9:15 AM Medical Record Number: [id number] Patient Account Number: [id number] Date of Birth/Sex: 07/14/1945 (72 y.o. M) Treating RN: Ahmed Prima Primary Care Provider: [name] Other Clinician: Referring Provider: [name] Treating Provider/Extender: Melburn Hake, [name]: 7 Verbal / Phone Orders: Yes ClinicianCarolyne Fiscal, Debi Read Back and Verified: Yes Diagnosis Coding ICD-10 Coding Code Description L97.222 Non-pressure chronic ulcer of left calf with fat layer exposed I89.0 Lymphedema, not elsewhere classified E66.9 Obesity, unspecified Wound Cleansing Wound #13 Right Lower Leg o Clean wound with Normal Saline. o May Shower,  gently pat wound dry prior to applying new dressing. Skin Barriers/Peri-Wound Care Wound #13 Right Lower Leg o Moisturizing lotion Primary Wound Dressing Wound #13 Right Lower Leg o Hydrafera Blue Ready Transfer Secondary Dressing Wound #13 Right Lower Leg o ABD pad Dressing Change Frequency Wound #13 Right Lower Leg o Other: - HHRN to  change wraps on Fridays Tuesdays at [place] Follow-up Appointments Wound #13 Right Lower Leg o Return Appointment in 1 week. Edema Control Wound #13 Right Lower Leg o 3 Layer Compression System - Bilateral - unna to anchor Additional Orders / Instructions Wound #13 Right Lower Leg o Activity as tolerated REMER, COUSE ([id number]) [place] #13 Right Lower Leg o [place] Visits - HHRN to change wraps on Fridays o Home Health Nurse may visit PRN to address patientos wound care needs. o FACE TO FACE ENCOUNTER: MEDICARE and MEDICAID PATIENTS: I certify that this patient is under my care and that I had a face-to-face encounter that meets the physician face-to-face encounter requirements with this patient on this date. The encounter with the patient was in whole or in part for the following MEDICAL CONDITION: (primary reason for [place]) MEDICAL NECESSITY: I certify, that based on my findings, NURSING services are a medically necessary home health service. HOME BOUND STATUS: I certify that my clinical findings support that this patient is homebound (i.e., Due to illness or injury, pt requires aid of supportive devices such as crutches, cane, wheelchairs, walkers, the use of special transportation or the assistance of another person to leave their place of residence. There is a normal inability to leave the home and doing so requires considerable and taxing effort. Other absences are for medical reasons / religious services and are infrequent or of short duration when for other  reasons). o If current dressing causes regression in wound condition, may D/C ordered dressing product/s and apply Normal Saline Moist Dressing daily until next [place] / Other MD appointment. [place] of regression in wound condition at [phone]. o Please direct any NON-WOUND related issues/requests for orders to patient's Primary Care Physician Patient Medications Allergies: Corticosteroids (Glucocorticoids) Notifications Medication Indication Start End lidocaine DOSE 1 - topical 4 % cream - 1 cream topical Electronic Signature(s) Signed: 12/11/2017 5:13:20 PM By: Alric Quan Signed: 12/11/2017 11:53:57 PM By: Worthy Keeler PA-C Entered By: Alric Quan on 12/11/2017 [place] ([id number]) -------------------------------------------------------------------------------- Prescription 12/11/2017 Patient Name: Adam Merritt Provider: Worthy Keeler PA-C Date of Birth: 09/16/1945 NPI#: [id number] Sex: M DEA#: [id number] Phone #: [phone] License #: Patient Address: [facility] [place], [place] [address] [address], [place], [place] [address] [phone] Allergies Corticosteroids (Glucocorticoids) Medication Medication: Route: Strength: Form: lidocaine topical 4% cream Class: TOPICAL LOCAL ANESTHETICS Dose: Frequency / Time: Indication: 1 1 cream topical Number of Refills: Number of Units: 0 Generic Substitution: Start Date: End Date: Administered at Substitution Permitted Facility: Yes Time Administered: Time Discontinued: Note to Pharmacy: Signature(s): Date(s): Electronic Signature(s) Signed: 12/11/2017 5:13:20 PM By: Alric Quan Signed: 12/11/2017 11:53:57 PM By: Worthy Keeler PA-C Entered By: Alric Quan on 12/11/2017 09:55:04 Adam Merritt ([id number]) PREM, COYKENDALL  ([id number]) --------------------------------------------------------------------------------  Problem List Details Patient Name: Adam Merritt Date of Service: 12/11/2017 9:15 AM Medical Record Number: [id number] Patient Account Number: [id number] Date of Birth/Sex: 08-23-1945 (72 y.o. M) Treating RN: Ahmed Prima Primary Care Provider: [name] Other Clinician: Referring Provider: [name] Treating Provider/Extender: Melburn Hake, [name]: 7 Active Problems ICD-10 Evaluated Encounter Code Description Active Date Today Diagnosis L97.222 Non-pressure chronic ulcer of left calf with fat layer exposed 10/18/2017 No Yes I89.0 Lymphedema, not elsewhere classified 10/18/2017 No Yes E66.9 Obesity, unspecified 10/18/2017 No Yes Inactive Problems  Resolved Problems ICD-10 Code Description Active Date Resolved Date L97.211 Non-pressure chronic ulcer of right calf limited to breakdown of skin 10/18/2017 10/18/2017 Electronic Signature(s) Signed: 12/11/2017 11:53:57 PM By: Worthy Keeler PA-C Entered By: Worthy Keeler on 12/11/2017 09:11:09 Adam Merritt ([id number]) -------------------------------------------------------------------------------- Progress Note Details Patient Name: Adam Merritt Date of Service: 12/11/2017 9:15 AM Medical Record Number: [id number] Patient Account Number: [id number] Date of Birth/Sex: 1946-05-17 (72 y.o. M) Treating RN: Ahmed Prima Primary Care Provider: [name] Other Clinician: Referring Provider: [name] Treating Provider/Extender: Melburn Hake, [name]: 7 Subjective Chief Complaint Information obtained from Patient he is here for evaluation of bilateral lower extremity lymphedema/venous ulcers History of Present Illness (HPI) The following HPI elements were documented for the patient's wound: Location: bilateral lower extremity Quality: no pain to ulcerations Duration: ulcerations have been present 3-4  weeks Context: ulcerations of secondary to edema, blister formation 10/18/17-He is here for initial evaluation of bilateral lower extremity ulcerations in the presence of venous insufficiency and lymphedema. He has been seen by vascular medicine in the past, Dr. Lucky Cowboy, last seen in 2016. He does have a history of abnormal ABIs, which is to be expected given his lymphedema and venous insufficiency. According to Epic, it appears that all attempts for arterial evaluation and/or angiography were not follow through with by patient. He does have a history of being seen in lymphedema clinic in 2018, stopped going approximately 6 months ago stating "it didn't do any good". He does not have lymphedema pumps, he does not have custom fit compression wrap/stockings. He is diabetic and his recent A1c last month was 7.6. He admits to chronic bilateral lower extremity pain, no change in pain since blister and ulceration development. He is currently being treated with Levaquin for bronchitis. He has home health and we will continue. 10/25/17-He is here in follow-up evaluation for bilateral lower extremity ulcerationssubtle he remains on Levaquin for bronchitis. Right lower extremity with no evidence of drainage or ulceration, persistent left lower extremity ulceration. He states that home health has not been out since his appointment. He went to [facility] on Tuesday, studies revealed: RIGHT ABI 0.9, TBI 0.6 LEFT ABI 1.1, TBI 0.6 with triphasic flow bilaterally. We will continue his same treatment plan. He has been educated on compression therapy and need for elevation. He will benefit from lymphedema pumps 11/01/17-He is here in follow-up evaluation for left lower extremity ulcer. The right lower extremity remains healed. He has home health services, but they have not been out to see the patient for 2-3 weeks. He states it home health physical therapy changed his dressing yesterday after therapy; he  placed Ace wrap compression. We are still waiting for lymphedema pumps, reordered d/t need for company change. 11/08/17-He is here in follow-up evaluation for left lower extremity ulcer. It is improved. Edema is significantly improved with compression therapy. We will continue with same treatment plan and he will follow-up next week. No word regarding lymphedema pumps 11/15/17-He is here in follow-up evaluation for left lower extremity ulcer. He is healed and will be discharged from wound care services. I have reached out to [facility] regarding his lymphedema pumps. They have been unable to reach the patient; the contact number they had with the patient's wife's cell phone and she has not answered any unrecognized calls. Contact should be made today, trial planned for next week; [facility] will continue to follow 11/27/17 on evaluation today patient has multiple blistered areas  over the right lower extremity his left lower extremity appears to be doing okay. These blistered areas show signs of no infection which is great news. With that being said he did have some necrotic skin overlying which was mechanically debrided away with saline and gauze today without complication. Overall post debridement the wounds appear to be doing better but in general his swelling seems to be increased. This is obviously not good news. I think this is what has given rise to the blisters. 12/04/17 on evaluation today patient presents for follow-up concerning his bilateral lower extremity edema in the right lower extremity ulcers. He has been tolerating the dressing changes without complication. With that being said he has had no real issues with the wraps which is also good news. Overall I'm pleased with the progress he's been making. KEVAUGHN, EWING ([id number]) 12/11/17 on evaluation today patient appears to be doing rather well in regard to his right lateral lower extremity ulcer. He's been tolerating the  dressing changes without complication. Fortunately there does not appear to be any evidence of infection at this time. Overall I'm pleased with the progress that is being made. Unfortunately he has been in the hospital due to having what sounds to be a stomach virus/flu fortunately that is starting to get better. Patient History Information obtained from Patient. Family History Cancer - Paternal Grandparents, Kidney Disease - Siblings, Lung Disease - Mother, No family history of Diabetes, Heart Disease, Hereditary Spherocytosis, Hypertension, Seizures, Stroke, Thyroid Problems, Tuberculosis. Social History Never smoker, Marital Status - Married, Alcohol Use - Never, Drug Use - No History, Caffeine Use - Never. Medical History Hospitalization/Surgery History - 11/03/2013, [facility], bronchitis. Medical And Surgical History Notes Respiratory home O2 at night as needed Cardiovascular varicose veins Neurologic CVA - 1992 Review of Systems (ROS) Constitutional Symptoms (General Health) Denies complaints or symptoms of Fever, Chills. Respiratory The patient has no complaints or symptoms. Cardiovascular Complains or has symptoms of LE edema. Psychiatric The patient has no complaints or symptoms. Objective Constitutional Obese and well-hydrated in no acute distress. Vitals Time Taken: 9:10 AM, Height: 66 in, Weight: 298 lbs, BMI: 48.1, Temperature: 98.2 F, Pulse: 71 bpm, Respiratory Rate: 18 breaths/min, Blood Pressure: 148/79 mmHg. Respiratory normal breathing without difficulty. clear to auscultation bilaterally. MASOUD, NYCE ([id number]) Cardiovascular regular rate and rhythm with normal S1, S2. Psychiatric this patient is able to make decisions and demonstrates good insight into disease process. Alert and Oriented x 3. pleasant and cooperative. General Notes: On evaluation today patient's wound actually showed good granulation that did not appear to be any evidence of infection no  slough noted at this point. 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 Integumentary (Hair, Skin) Wound #13 status is Open. Original cause of wound was Gradually Appeared. The wound is located on the Right Lower Leg. The wound measures 2cm length x 3.3cm width x 0.1cm depth; 5.184cm^2 area and 0.518cm^3 volume. There is no tunneling or undermining noted. There is a small amount of serous drainage noted. The wound margin is flat and intact. There is large (67-100%) red granulation within the wound bed. There is no necrotic tissue within the wound bed. The periwound skin appearance exhibited: Hemosiderin Staining. The periwound skin appearance did not exhibit: Callus, Crepitus, Excoriation, Induration, Rash, Scarring, Dry/Scaly, Maceration, Atrophie Blanche, Cyanosis, Ecchymosis, Mottled, Pallor, Rubor, Erythema. Periwound temperature was noted as No Abnormality. Assessment Active Problems ICD-10 Non-pressure chronic ulcer of left calf with fat layer exposed Lymphedema, not elsewhere classified  Obesity, unspecified Plan Wound Cleansing: Wound #13 Right Lower Leg: Clean wound with Normal Saline. May Shower, gently pat wound dry prior to applying new dressing. Skin Barriers/Peri-Wound Care: Wound #13 Right Lower Leg: Moisturizing lotion Primary Wound Dressing: Wound #13 Right Lower Leg: Hydrafera Blue Ready Transfer Secondary Dressing: Wound #13 Right Lower Leg: ABD pad Dressing Change Frequency: Wound #13 Right Lower Leg: Other: - HHRN to change wraps on Fridays Tuesdays at [place] Follow-up Appointments: Wound #13 Right Lower Leg: Return Appointment in 1 week. JENE, ORAVEC ([id number]) Edema Control: Wound #13 Right Lower Leg: 3 Layer Compression System - Bilateral - unna to anchor Additional Orders / Instructions: Wound #13 Right Lower Leg: Activity as tolerated Home Health: Wound #13 Right Lower Leg: Continue Home Health Visits - HHRN to  change wraps on Fridays Home Health Nurse may visit PRN to address patient s wound care needs. FACE TO FACE ENCOUNTER: MEDICARE and MEDICAID PATIENTS: I certify that this patient is under my care and that I had a face-to-face encounter that meets the physician face-to-face encounter requirements with this patient on this date. The encounter with the patient was in whole or in part for the following MEDICAL CONDITION: (primary reason for [place]) MEDICAL NECESSITY: I certify, that based on my findings, NURSING services are a medically necessary home health service. HOME BOUND STATUS: I certify that my clinical findings support that this patient is homebound (i.e., Due to illness or injury, pt requires aid of supportive devices such as crutches, cane, wheelchairs, walkers, the use of special transportation or the assistance of another person to leave their place of residence. There is a normal inability to leave the home and doing so requires considerable and taxing effort. Other absences are for medical reasons / religious services and are infrequent or of short duration when for other reasons). If current dressing causes regression in wound condition, may D/C ordered dressing product/s and apply Normal Saline Moist Dressing daily until next [place] / Other MD appointment. [place] of regression in wound condition at [phone]. Please direct any NON-WOUND related issues/requests for orders to patient's Primary Care Physician The following medication(s) was prescribed: lidocaine topical 4 % cream 1 1 cream topical was prescribed at facility I am going to suggest at this point that we go ahead and continue with the Current wound care measures for the next week since he seems to be doing so well. The patient is in agreement the plan. We will subsequently see were things stand at follow-up. Please see above for specific wound care orders. We will see patient  for re-evaluation in 1 week(s) here in the clinic. If anything worsens or changes patient will contact our office for additional recommendations. Electronic Signature(s) Signed: 12/11/2017 11:53:57 PM By: Worthy Keeler PA-C Entered By: Worthy Keeler on 12/11/2017 10:13:58 Adam Merritt ([id number]) -------------------------------------------------------------------------------- ROS/PFSH Details Patient Name: Adam Merritt Date of Service: 12/11/2017 9:15 AM Medical Record Number: [id number] Patient Account Number: [id number] Date of Birth/Sex: 1946-06-04 (72 y.o. M) Treating RN: Ahmed Prima Primary Care Provider: [name] Other Clinician: Referring Provider: [name] Treating Provider/Extender: Melburn Hake, [name]: 7 Information Obtained From Patient Wound History Do you currently have one or more open woundso No Have you tested positive for osteomyelitis (bone infection)o No Have you had any tests for circulation on your legso Yes Who ordered the testo [facility] Where was the test doneo [facility] Have you had  other problems associated with your woundso Swelling Constitutional Symptoms (General Health) Complaints and Symptoms: Negative for: Fever; Chills Cardiovascular Complaints and Symptoms: Positive for: LE edema Medical History: Positive for: Congestive Heart Failure; Coronary Artery Disease; Hypertension; Peripheral Venous Disease Negative for: Angina; Arrhythmia; Deep Vein Thrombosis; Hypotension; Myocardial Infarction; Peripheral Arterial Disease; Phlebitis; Vasculitis Past Medical History Notes: varicose veins Eyes Medical History: Negative for: Cataracts; Glaucoma; Optic Neuritis Ear/Nose/Mouth/Throat Medical History: Negative for: Chronic sinus problems/congestion; Middle ear problems Hematologic/Lymphatic Medical History: Positive for: Anemia - aplastic anemia; Lymphedema Negative for: Hemophilia; Human Immunodeficiency Virus; Sickle Cell  Disease Respiratory Complaints and Symptoms: No Complaints or Symptoms Medical History: Positive for: Chronic Obstructive Pulmonary Disease (COPD); Sleep Apnea KASPER, MUDRICK ([id number]) Negative for: Aspiration; Asthma; Pneumothorax; Tuberculosis Past Medical History Notes: home O2 at night as needed Gastrointestinal Medical History: Negative for: Cirrhosis ; Colitis; Crohnos; Hepatitis A; Hepatitis B; Hepatitis C Endocrine Medical History: Positive for: Type II Diabetes Negative for: Type I Diabetes Time with diabetes: since 2006 Treated with: Insulin Blood sugar tested every day: Yes Tested : Blood sugar testing results: Breakfast: 209 Genitourinary Medical History: Negative for: End Stage Renal Disease Immunological Medical History: Negative for: Lupus Erythematosus; Raynaudos; Scleroderma Integumentary (Skin) Medical History: Negative for: History of Burn; History of pressure wounds Musculoskeletal Medical History: Positive for: Osteoarthritis Negative for: Gout; Rheumatoid Arthritis; Osteomyelitis Neurologic Medical History: Positive for: Neuropathy Negative for: Dementia; Quadriplegia; Paraplegia; Seizure Disorder Past Medical History Notes: CVA - 1992 Oncologic Medical History: Positive for: Received Chemotherapy - last dose 2006 Negative for: Received Radiation Psychiatric Complaints and Symptoms: No Complaints or Symptoms LUC, SHAMMAS ([id number]) Medical History: Negative for: Anorexia/bulimia; Confinement Anxiety Immunizations Pneumococcal Vaccine: Received Pneumococcal Vaccination: Yes Implantable Devices Hospitalization / Surgery History Name of Hospital Purpose of Hospitalization/Surgery Date [facility] bronchitis 11/03/2013 Family and Social History Cancer: Yes - Paternal Grandparents; Diabetes: No; Heart Disease: No; Hereditary Spherocytosis: No; Hypertension: No; Kidney Disease: Yes - Siblings; Lung Disease: Yes - Mother; Seizures: No; Stroke:  No; Thyroid Problems: No; Tuberculosis: No; Never smoker; Marital Status - Married; Alcohol Use: Never; Drug Use: No History; Caffeine Use: Never; Financial Concerns: No; Food, Clothing or Shelter Needs: No; Support System Lacking: No; Transportation Concerns: No; Advanced Directives: No; Patient does not want information on Advanced Directives Physician Affirmation I have reviewed and agree with the above information. Electronic Signature(s) Signed: 12/11/2017 5:13:20 PM By: Alric Quan Signed: 12/11/2017 11:53:57 PM By: Worthy Keeler PA-C Entered By: Worthy Keeler on 12/11/2017 10:13:07 Adam Merritt ([id number]) -------------------------------------------------------------------------------- SuperBill Details Patient Name: Adam Merritt Date of Service: 12/11/2017 Medical Record Number: [id number] Patient Account Number: [id number] Date of Birth/Sex: [birth year] (72 y.o. M) Treating RN: Ahmed Prima Primary Care Provider: [name] Other Clinician: Referring Provider: [name] Treating Provider/Extender: Melburn Hake, [name]: 7 Diagnosis Coding ICD-10 Codes Code Description [phone] Non-pressure chronic ulcer of left calf with fat layer exposed I89.0 Lymphedema, not elsewhere classified E66.9 Obesity, unspecified Facility Procedures CPT4: Description Modifier Quantity Code [id number] BILATERAL: Application of multi-layer venous compression system; leg (below 1 knee), including ankle and foot. Physician Procedures CPT4 Code: [id number] Description: 74827 - WC PHYS LEVEL 3 - EST PT ICD-10 Diagnosis Description L97.222 Non-pressure chronic ulcer of left calf with fat layer expo I89.0 Lymphedema, not elsewhere classified E66.9 Obesity, unspecified Modifier: sed Quantity: 1 Electronic Signature(s) Signed: 12/11/2017 11:53:57 PM By: Worthy Keeler PA-C Previous Signature: 12/11/2017 10:06:32 AM Version By: Alric Quan Entered By: Worthy Keeler on  12/11/2017 10:14:23 

## 2017-12-14 ENCOUNTER — Telehealth: Payer: Self-pay

## 2017-12-14 ENCOUNTER — Other Ambulatory Visit: Payer: Self-pay | Admitting: Nurse Practitioner

## 2017-12-14 DIAGNOSIS — E1142 Type 2 diabetes mellitus with diabetic polyneuropathy: Secondary | ICD-10-CM

## 2017-12-14 MED ORDER — GABAPENTIN 400 MG PO CAPS: 400.0000 mg | ORAL_CAPSULE | Freq: Three times a day (TID) | ORAL | 1 refills | Status: DC

## 2017-12-14 NOTE — Telephone Encounter (Signed)
CALLED AND LMOM THAT HIS REFILL HIS READY

## 2017-12-14 NOTE — Telephone Encounter (Signed)
Refilled gabapentin 400mg  TID for neuropathy. Sent to his pharmacy.

## 2017-12-14 NOTE — Progress Notes (Signed)
Refilled gabapentin 400mg  TID as needed. Sent to his pharmacy

## 2017-12-17 ENCOUNTER — Encounter: Payer: Self-pay | Admitting: Adult Health

## 2017-12-17 ENCOUNTER — Ambulatory Visit (INDEPENDENT_AMBULATORY_CARE_PROVIDER_SITE_OTHER): Payer: Medicare Other | Admitting: Adult Health

## 2017-12-17 VITALS — BP 141/66 | HR 78 | Resp 16 | Ht 66.0 in | Wt 294.8 lb

## 2017-12-17 DIAGNOSIS — K529 Noninfective gastroenteritis and colitis, unspecified: Secondary | ICD-10-CM

## 2017-12-17 DIAGNOSIS — G894 Chronic pain syndrome: Secondary | ICD-10-CM | POA: Diagnosis not present

## 2017-12-17 DIAGNOSIS — Z794 Long term (current) use of insulin: Secondary | ICD-10-CM | POA: Diagnosis not present

## 2017-12-17 DIAGNOSIS — E1165 Type 2 diabetes mellitus with hyperglycemia: Secondary | ICD-10-CM | POA: Diagnosis not present

## 2017-12-17 DIAGNOSIS — E119 Type 2 diabetes mellitus without complications: Secondary | ICD-10-CM

## 2017-12-17 DIAGNOSIS — E860 Dehydration: Secondary | ICD-10-CM | POA: Diagnosis not present

## 2017-12-17 DIAGNOSIS — IMO0001 Reserved for inherently not codable concepts without codable children: Secondary | ICD-10-CM

## 2017-12-17 NOTE — Progress Notes (Signed)
[facility] [place], [place] [address]  Internal MEDICINE  Office Visit Note  Patient Name: Adam Merritt  [id number]  [id number]  Date of Service: 12/19/2017  Chief Complaint  Patient presents with  . Diarrhea    going on few days and pt went to Er but they don't gave him anything   . Diabetes  . Osteoarthritis     HPI Pt is here for a sick visit.  Pt reports he was in the Er on 12/09/17.  He was seen for diarrhea and nauesa.  He was diagnosed with gastroenteritis and dehydration.  He has since recovered.  Diarrhea has stopped and he is very happy on his current medication regimen.      Current Medication:  Outpatient Encounter Medications as of 12/17/2017  Medication Sig  . ibuprofen (ADVIL,MOTRIN) 600 MG tablet Take 600 mg by mouth 2 (two) times daily as needed for mild pain or moderate pain.   . indapamide (LOZOL) 2.5 MG tablet Take 1 tablet (2.5 mg total) by mouth daily.  . Insulin Glargine (BASAGLAR KWIKPEN) 100 UNIT/ML SOPN Inject 30 Units into the skin at bedtime.  Marland Kitchen ipratropium-albuterol (DUONEB) 0.5-2.5 (3) MG/3ML SOLN Take 3 mLs by nebulization every 4 (four) hours as needed (for shortness of breath).  Marland Kitchen levofloxacin (LEVAQUIN) 750 MG tablet Take 1 tablet (750 mg total) by mouth daily.  . Liraglutide (VICTOZA) 18 MG/3ML SOPN Inject 1.8 mg into the skin daily.   . methylPREDNISolone (MEDROL) 4 MG TBPK tablet Take by mouth as directed for 6 days  . metolazone (ZAROXOLYN) 2.5 MG tablet Take 1 tablet (2.5 mg total) by mouth daily.  . Multiple Vitamin (MULTIVITAMIN WITH MINERALS) TABS tablet Take 1 tablet by mouth daily.  Marland Kitchen omeprazole (PRILOSEC) 20 MG capsule Take 20 mg by mouth daily.  . Oxycodone HCl 10 MG TABS Half to one tab as needed prn for pain (Patient taking differently: Half to one tab  prn for pain)  . pentoxifylline (TRENTAL) 400 MG CR tablet TAKE 1 TABLET BY MOUTH THREE TIMES DAILY FOR CLAUDICATION  . potassium chloride SA  (K-DUR,KLOR-CON) 20 MEQ tablet Take 20 mEq by mouth 2 (two) times daily.  . sennosides-docusate sodium (SENOKOT-S) 8.6-50 MG tablet Take 1-2 tablets by mouth 2 (two) times daily.  Marland Kitchen UREA 20 INTENSIVE HYDRATING 20 % cream USE AS DIRECTED 3 TIMES A WEEK  . zolpidem (AMBIEN CR) 12.5 MG CR tablet Take 1 tablet (12.5 mg total) by mouth at bedtime as needed for sleep.  Marland Kitchen albuterol (PROVENTIL HFA;VENTOLIN HFA) 108 (90 Base) MCG/ACT inhaler Inhale 1 puff into the lungs every 6 (six) hours as needed for wheezing or shortness of breath.  Marland Kitchen amoxicillin-clavulanate (AUGMENTIN) 875-125 MG tablet Take 1 tablet by mouth 2 (two) times daily. (Patient not taking: Reported on 10/25/2017)  . aspirin 325 MG EC tablet Take 325 mg by mouth daily.  Marland Kitchen atorvastatin (LIPITOR) 40 MG tablet Take 40 mg by mouth at bedtime.   . carvedilol (COREG) 25 MG tablet Take 25 mg by mouth 2 (two) times daily with a meal.  . colchicine 0.6 MG tablet Take 1 tablet (0.6 mg total) by mouth daily. (Patient not taking: Reported on 11/27/2017)  . docusate sodium (COLACE) 50 MG capsule Take 50 mg by mouth 2 (two) times daily.  . febuxostat (ULORIC) 40 MG tablet Take one tab a day for gout, cannot take allopurinol (Patient taking differently: Take 40 mg by mouth daily. )  . fentaNYL (DURAGESIC -  DOSED MCG/HR) 50 MCG/HR Place 1 patch (50 mcg total) onto the skin every 3 (three) days.  . furosemide (LASIX) 40 MG tablet Take one tablet (40mg ) by mouth daily  . gabapentin (NEURONTIN) 400 MG capsule Take 1 capsule (400 mg total) by mouth 3 (three) times daily.  . hydrALAZINE (APRESOLINE) 25 MG tablet Take 25 mg by mouth 3 (three) times daily.   No facility-administered encounter medications on file as of 12/17/2017.    Medical History: Past Medical History:  Diagnosis Date  . Anemia   . CAD (coronary artery disease)   . Chest pain   . Coronary artery disease   . Diabetes mellitus without complication ([place])   . Esophageal reflux   . Herpes  zoster without mention of complication   . Hyperlipidemia   . Hypertension   . Insomnia   . Lumbago   . Neuropathy in diabetes ([place])   . Obstructive chronic bronchitis without exacerbation ([place])   . Other malaise and fatigue   . Stroke ([place])   . Varicose veins      Vital Signs: BP (!) 141/66   Pulse 78   Resp 16   Ht 5\' 6"  (1.676 m)   Wt 294 lb 12.8 oz (133.7 kg)   SpO2 93%   BMI 47.58 kg/m    Review of Systems  Constitutional: Negative.  Negative for chills.  HENT: Negative.   Eyes: Negative.   Respiratory: Negative.   Cardiovascular: Negative.   Gastrointestinal: Negative.  Negative for abdominal distention, abdominal pain, constipation, diarrhea, nausea and vomiting.  Endocrine: Negative.   Genitourinary: Negative.   Musculoskeletal: Negative.   Neurological: Negative.   Psychiatric/Behavioral: Negative.     Physical Exam  Constitutional: He is oriented to person, place, and time. He appears well-developed and well-nourished.  Eyes: Pupils are equal, round, and reactive to light.  Neck: Normal range of motion.  Cardiovascular: Normal rate and normal heart sounds.  Pulmonary/Chest: Effort normal and breath sounds normal.  Abdominal: Soft.  Neurological: He is alert and oriented to person, place, and time.  Skin: Skin is warm and dry.  Nursing note and vitals reviewed.   Assessment/Plan: 1. Gastroenteritis Resolved at this time. Denies diarrhea, vomiting or abdominal pain.   2. Chronic pain disorder Pt happy with gabapentin and fentanyl patch.  States he doesn't want to take oxycodone anymore.  Continue medications as before, stopping oxycodone.   3. Dehydration Pt has been drinking water, and making urine ok.  Denies headache or dizziness.     4. Insulin dependent diabetes mellitus ([place]) Blood sugars stable per patient.  Continue medications as directed follow up as previously ordered.    General Counseling: Tyner verbalizes understanding of the  findings of todays visit and agrees with plan of treatment. I have discussed any further diagnostic evaluation that may be needed or ordered today. We also reviewed his medications today. he has been encouraged to call the office with any questions or concerns that should arise related to todays visit.   Time spent: 25 Minutes  This patient was seen by Orson Gear AGNP-C in Collaboration with Dr [name] as a part of collaborative care agreement

## 2017-12-18 ENCOUNTER — Encounter: Payer: Medicare Other | Admitting: Physician Assistant

## 2017-12-18 DIAGNOSIS — E11622 Type 2 diabetes mellitus with other skin ulcer: Secondary | ICD-10-CM | POA: Diagnosis not present

## 2017-12-20 NOTE — Progress Notes (Signed)
Adam Merritt, Adam Merritt ([id number]) Visit Report for 12/18/2017 Chief Complaint Document Details Patient Name: Adam Merritt, Adam Merritt Date of Service: 12/18/2017 11:15 AM Medical Record Number: [id number] Patient Account Number: [id number] Date of Birth/Sex: 01/05/1946 (72 y.o. M) Treating RN: Ahmed Prima Primary Care Provider: [name] Other Clinician: Referring Provider: [name] Treating Provider/Extender: Melburn Hake, Tallen Schnorr [name]: 8 Information Obtained from: Patient Chief Complaint he is here for evaluation of bilateral lower extremity lymphedema/venous ulcers Electronic Signature(s) Signed: 12/19/2017 1:39:21 PM By: Worthy Keeler PA-C Entered By: Worthy Keeler on 12/18/2017 11:52:20 Adam Merritt ([id number]) -------------------------------------------------------------------------------- HPI Details Patient Name: Adam Merritt Date of Service: 12/18/2017 11:15 AM Medical Record Number: [id number] Patient Account Number: [id number] Date of Birth/Sex: [birth year] (72 y.o. M) Treating RN: Ahmed Prima Primary Care Provider: [name] Other Clinician: Referring Provider: [name] Treating Provider/Extender: Melburn Hake, Janey Petron [name]: 8 History of Present Illness Location: bilateral lower extremity Quality: no pain to ulcerations Duration: ulcerations have been present 3-4 weeks Context: ulcerations of secondary to edema, blister formation HPI Description: 10/18/17-He is here for initial evaluation of bilateral lower extremity ulcerations in the presence of venous insufficiency and lymphedema. He has been seen by vascular medicine in the past, Dr. Lucky Cowboy, last seen in 2016. He does have a history of abnormal ABIs, which is to be expected given his lymphedema and venous insufficiency. According to Epic, it appears that all attempts for arterial evaluation and/or angiography were not follow through with by patient. He does have a history of being seen in  lymphedema clinic in 2018, stopped going approximately 6 months ago stating "it didn't do any good". He does not have lymphedema pumps, he does not have custom fit compression wrap/stockings. He is diabetic and his recent A1c last month was 7.6. He admits to chronic bilateral lower extremity pain, no change in pain since blister and ulceration development. He is currently being treated with Levaquin for bronchitis. He has home health and we will continue. 10/25/17-He is here in follow-up evaluation for bilateral lower extremity ulcerationssubtle he remains on Levaquin for bronchitis. Right lower extremity with no evidence of drainage or ulceration, persistent left lower extremity ulceration. He states that home health has not been out since his appointment. He went to [facility] on Tuesday, studies revealed: RIGHT ABI 0.9, TBI 0.6 LEFT ABI 1.1, TBI 0.6 with triphasic flow bilaterally. We will continue his same treatment plan. He has been educated on compression therapy and need for elevation. He will benefit from lymphedema pumps 11/01/17-He is here in follow-up evaluation for left lower extremity ulcer. The right lower extremity remains healed. He has home health services, but they have not been out to see the patient for 2-3 weeks. He states it home health physical therapy changed his dressing yesterday after therapy; he placed Ace wrap compression. We are still waiting for lymphedema pumps, reordered d/t need for company change. 11/08/17-He is here in follow-up evaluation for left lower extremity ulcer. It is improved. Edema is significantly improved with compression therapy. We will continue with same treatment plan and he will follow-up next week. No word regarding lymphedema pumps 11/15/17-He is here in follow-up evaluation for left lower extremity ulcer. He is healed and will be discharged from wound care services. I have reached out to [facility] regarding his lymphedema  pumps. They have been unable to reach the patient; the contact number they had with the patient's wife's cell phone and she has not answered  any unrecognized calls. Contact should be made today, trial planned for next week; [facility] will continue to follow 11/27/17 on evaluation today patient has multiple blistered areas over the right lower extremity his left lower extremity appears to be doing okay. These blistered areas show signs of no infection which is great news. With that being said he did have some necrotic skin overlying which was mechanically debrided away with saline and gauze today without complication. Overall post debridement the wounds appear to be doing better but in general his swelling seems to be increased. This is obviously not good news. I think this is what has given rise to the blisters. 12/04/17 on evaluation today patient presents for follow-up concerning his bilateral lower extremity edema in the right lower extremity ulcers. He has been tolerating the dressing changes without complication. With that being said he has had no real issues with the wraps which is also good news. Overall I'm pleased with the progress he's been making. 12/11/17 on evaluation today patient appears to be doing rather well in regard to his right lateral lower extremity ulcer. He's been tolerating the dressing changes without complication. Fortunately there does not appear to be any evidence of infection at this time. Overall I'm pleased with the progress that is being made. Unfortunately he has been in the hospital due to having what sounds to be a stomach virus/flu fortunately that is starting to get better. 12/18/17 on evaluation today patient actually appears to be doing very well in regard to his bilateral lower extremities the swelling is under fairly good control his lymphedema pumps are still not up and running quite yet. With that being said he does have several areas of opening noted  as far as wounds are concerned mainly over the left lower extremity. With that Adam Merritt, Adam Merritt ([id number]) being said I do believe once he gets lymphedema pumps this would least help you mention some the fluid and preventing this from occurring. Hopefully that will be set up soon sleeves are Artie in place at his home he just waiting for the machine. Electronic Signature(s) Signed: 12/19/2017 1:39:21 PM By: Worthy Keeler PA-C Entered By: Worthy Keeler on 12/19/2017 13:25:25 Adam Merritt ([id number]) -------------------------------------------------------------------------------- Physical Exam Details Patient Name: Adam Merritt Date of Service: 12/18/2017 11:15 AM Medical Record Number: [id number] Patient Account Number: [id number] Date of Birth/Sex: 1946/05/03 (72 y.o. M) Treating RN: Ahmed Prima Primary Care Provider: [name] Other Clinician: Referring Provider: [name] Treating Provider/Extender: STONE III, Davell Beckstead [name]: 8 Constitutional Obese and well-hydrated in no acute distress. Respiratory normal breathing without difficulty. clear to auscultation bilaterally. Cardiovascular regular rate and rhythm with normal S1, S2. 2+ pitting edema of the bilateral lower extremities. Psychiatric this patient is able to make decisions and demonstrates good insight into disease process. Alert and Oriented x 3. pleasant and cooperative. Notes At this point patient's wound again appears to be very superficial as far as the lower extremities are concerned there is no overall worsening that I see and in fact I think most of this appears to be blistering but has ruptured secondary to the lymphedema. Again this is left greater than right. I do believe this will be aided by she should have lymphedema pumps. Electronic Signature(s) Signed: 12/19/2017 1:39:21 PM By: Worthy Keeler PA-C Entered By: Worthy Keeler on 12/19/2017 13:26:09 Adam Merritt  ([id number]) -------------------------------------------------------------------------------- Physician Orders Details Patient Name: Adam Merritt Date of Service: 12/18/2017 11:15 AM Medical Record  Number: [id number] Patient Account Number: [id number] Date of Birth/Sex: Oct 25, 1945 (72 y.o. M) Treating RN: Ahmed Prima Primary Care Provider: [name] Other Clinician: Referring Provider: [name] Treating Provider/Extender: Melburn Hake, Koni Kannan [name]: 8 Verbal / Phone Orders: Yes ClinicianCarolyne Fiscal, Debi Read Back and Verified: Yes Diagnosis Coding ICD-10 Coding Code Description L97.222 Non-pressure chronic ulcer of left calf with fat layer exposed I89.0 Lymphedema, not elsewhere classified E66.9 Obesity, unspecified Wound Cleansing Wound #13 Right Lower Leg o Clean wound with Normal Saline. o Cleanse wound with mild soap and water o May Shower, gently pat wound dry prior to applying new dressing. Skin Barriers/Peri-Wound Care Wound #13 Right Lower Leg o Moisturizing lotion Primary Wound Dressing Wound #13 Right Lower Leg o Hydrafera Blue Ready Transfer Secondary Dressing Wound #13 Right Lower Leg o ABD pad Dressing Change Frequency Wound #13 Right Lower Leg o Other: - HHRN to change wraps on Fridays Tuesdays at [place] Follow-up Appointments Wound #13 Right Lower Leg o Return Appointment in 1 week. Edema Control Wound #13 Right Lower Leg o 3 Layer Compression System - Bilateral - unna to anchor Additional Orders / Instructions Wound #13 Right Lower Leg o Activity as tolerated Adam Merritt, Adam Merritt ([id number]) [place] #13 Right Lower Leg o [place] Visits - HHRN to change wraps on Fridays o Home Health Nurse may visit PRN to address patientos wound care needs. o FACE TO FACE ENCOUNTER: MEDICARE and MEDICAID PATIENTS: I certify that this patient is under my care and that I had a  face-to-face encounter that meets the physician face-to-face encounter requirements with this patient on this date. The encounter with the patient was in whole or in part for the following MEDICAL CONDITION: (primary reason for [place]) MEDICAL NECESSITY: I certify, that based on my findings, NURSING services are a medically necessary home health service. HOME BOUND STATUS: I certify that my clinical findings support that this patient is homebound (i.e., Due to illness or injury, pt requires aid of supportive devices such as crutches, cane, wheelchairs, walkers, the use of special transportation or the assistance of another person to leave their place of residence. There is a normal inability to leave the home and doing so requires considerable and taxing effort. Other absences are for medical reasons / religious services and are infrequent or of short duration when for other reasons). o If current dressing causes regression in wound condition, may D/C ordered dressing product/s and apply Normal Saline Moist Dressing daily until next [place] / Other MD appointment. [place] of regression in wound condition at [phone]. o Please direct any NON-WOUND related issues/requests for orders to patient's Primary Care Physician Patient Medications Allergies: Corticosteroids (Glucocorticoids) Notifications Medication Indication Start End lidocaine DOSE 1 - topical 4 % cream - 1 cream topical Electronic Signature(s) Signed: 12/19/2017 1:39:21 PM By: Worthy Keeler PA-C Signed: 12/19/2017 5:07:56 PM By: Alric Quan Entered By: Alric Quan on 12/18/2017 12:12:11 Adam Merritt, Adam Merritt ([id number]) -------------------------------------------------------------------------------- Prescription 12/18/2017 Patient Name: Adam Merritt Provider: Worthy Keeler PA-C Date of Birth: 07/06/1945 NPI#: [id number] Sex: M DEA#: [id number] Phone #: [phone]  License #: Patient Address: [facility] [place], [place] [address] [address] [place], [place] [address] [phone] Allergies Corticosteroids (Glucocorticoids) Medication Medication: Route: Strength: Form: lidocaine topical 4% cream Class: TOPICAL LOCAL ANESTHETICS Dose: Frequency / Time: Indication: 1 1 cream topical Number of  Refills: Number of Units: 0 Generic Substitution: Start Date: End Date: Administered at [place]: Yes Time Administered: Time Discontinued: Note to Pharmacy: Signature(s): Date(s): Electronic Signature(s) Signed: 12/19/2017 1:39:21 PM By: Worthy Keeler PA-C Signed: 12/19/2017 5:07:56 PM By: Alric Quan Entered By: Alric Quan on 12/18/2017 12:12:12 Adam Merritt ([id number]) Adam Merritt, Adam Merritt ([id number]) --------------------------------------------------------------------------------  Problem List Details Patient Name: Adam Merritt Date of Service: 12/18/2017 11:15 AM Medical Record Number: [id number] Patient Account Number: [id number] Date of Birth/Sex: 1945-10-16 (72 y.o. M) Treating RN: Ahmed Prima Primary Care Provider: [name] Other Clinician: Referring Provider: [name] Treating Provider/Extender: Melburn Hake, Frazer [place] [name]: 8 Active Problems ICD-10 Evaluated Encounter Code Description Active Date Today Diagnosis L97.222 Non-pressure chronic ulcer of left calf with fat layer exposed 10/18/2017 No Yes I89.0 Lymphedema, not elsewhere classified 10/18/2017 No Yes E66.9 Obesity, unspecified 10/18/2017 No Yes Inactive Problems Resolved Problems ICD-10 Code Description Active Date Resolved Date L97.211 Non-pressure chronic ulcer of right calf limited to breakdown of skin 10/18/2017 10/18/2017 Electronic Signature(s) Signed: 12/19/2017 1:39:21 PM By: Worthy Keeler PA-C Entered By: Worthy Keeler on 12/18/2017 11:52:09 Adam Merritt ([id number]) -------------------------------------------------------------------------------- Progress Note Details Patient Name: Adam Merritt Date of Service: 12/18/2017 11:15 AM Medical Record Number: [id number] Patient Account Number: [id number] Date of Birth/Sex: May 05, 1946 (72 y.o. M) Treating RN: Ahmed Prima Primary Care Provider: [name] Other Clinician: Referring Provider: [name] Treating Provider/Extender: Melburn Hake, Zaeden Lastinger [name]: 8 Subjective Chief Complaint Information obtained from Patient he is here for evaluation of bilateral lower extremity lymphedema/venous ulcers History of Present Illness (HPI) The following HPI elements were documented for the patient's wound: Location: bilateral lower extremity Quality: no pain to ulcerations Duration: ulcerations have been present 3-4 weeks Context: ulcerations of secondary to edema, blister formation 10/18/17-He is here for initial evaluation of bilateral lower extremity ulcerations in the presence of venous insufficiency and lymphedema. He has been seen by vascular medicine in the past, Dr. Lucky Cowboy, last seen in 2016. He does have a history of abnormal ABIs, which is to be expected given his lymphedema and venous insufficiency. According to Epic, it appears that all attempts for arterial evaluation and/or angiography were not follow through with by patient. He does have a history of being seen in lymphedema clinic in 2018, stopped going approximately 6 months ago stating "it didn't do any good". He does not have lymphedema pumps, he does not have custom fit compression wrap/stockings. He is diabetic and his recent A1c last month was 7.6. He admits to chronic bilateral lower extremity pain, no change in pain since blister and ulceration development. He is currently being treated with Levaquin for bronchitis. He has home health and we will continue. 10/25/17-He is  here in follow-up evaluation for bilateral lower extremity ulcerationssubtle he remains on Levaquin for bronchitis. Right lower extremity with no evidence of drainage or ulceration, persistent left lower extremity ulceration. He states that home health has not been out since his appointment. He went to [facility] on Tuesday, studies revealed: RIGHT ABI 0.9, TBI 0.6 LEFT ABI 1.1, TBI 0.6 with triphasic flow bilaterally. We will continue his same treatment plan. He has been educated on compression therapy and need for elevation. He will benefit from lymphedema pumps 11/01/17-He is here in follow-up evaluation for left lower extremity ulcer. The right lower extremity remains healed. He has home health services, but they have not been out to see the patient for 2-3 weeks.  He states it home health physical therapy changed his dressing yesterday after therapy; he placed Ace wrap compression. We are still waiting for lymphedema pumps, reordered d/t need for company change. 11/08/17-He is here in follow-up evaluation for left lower extremity ulcer. It is improved. Edema is significantly improved with compression therapy. We will continue with same treatment plan and he will follow-up next week. No word regarding lymphedema pumps 11/15/17-He is here in follow-up evaluation for left lower extremity ulcer. He is healed and will be discharged from wound care services. I have reached out to [facility] regarding his lymphedema pumps. They have been unable to reach the patient; the contact number they had with the patient's wife's cell phone and she has not answered any unrecognized calls. Contact should be made today, trial planned for next week; [facility] will continue to follow 11/27/17 on evaluation today patient has multiple blistered areas over the right lower extremity his left lower extremity appears to be doing okay. These blistered areas show signs of no infection which is great  news. With that being said he did have some necrotic skin overlying which was mechanically debrided away with saline and gauze today without complication. Overall post debridement the wounds appear to be doing better but in general his swelling seems to be increased. This is obviously not good news. I think this is what has given rise to the blisters. 12/04/17 on evaluation today patient presents for follow-up concerning his bilateral lower extremity edema in the right lower extremity ulcers. He has been tolerating the dressing changes without complication. With that being said he has had no real issues with the wraps which is also good news. Overall I'm pleased with the progress he's been making. Adam Merritt, Adam Merritt ([id number]) 12/11/17 on evaluation today patient appears to be doing rather well in regard to his right lateral lower extremity ulcer. He's been tolerating the dressing changes without complication. Fortunately there does not appear to be any evidence of infection at this time. Overall I'm pleased with the progress that is being made. Unfortunately he has been in the hospital due to having what sounds to be a stomach virus/flu fortunately that is starting to get better. 12/18/17 on evaluation today patient actually appears to be doing very well in regard to his bilateral lower extremities the swelling is under fairly good control his lymphedema pumps are still not up and running quite yet. With that being said he does have several areas of opening noted as far as wounds are concerned mainly over the left lower extremity. With that being said I do believe once he gets lymphedema pumps this would least help you mention some the fluid and preventing this from occurring. Hopefully that will be set up soon sleeves are Artie in place at his home he just waiting for the machine. Patient History Information obtained from Patient. Family History Cancer - Paternal Grandparents, Kidney Disease -  Siblings, Lung Disease - Mother, No family history of Diabetes, Heart Disease, Hereditary Spherocytosis, Hypertension, Seizures, Stroke, Thyroid Problems, Tuberculosis. Social History Never smoker, Marital Status - Married, Alcohol Use - Never, Drug Use - No History, Caffeine Use - Never. Medical History Hospitalization/Surgery History - 11/03/2013, [facility], bronchitis. Medical And Surgical History Notes Respiratory home O2 at night as needed Cardiovascular varicose veins Neurologic CVA - 1992 Review of Systems (ROS) Constitutional Symptoms (General Health) Denies complaints or symptoms of Fever, Chills. Respiratory The patient has no complaints or symptoms. Cardiovascular The patient has no complaints or symptoms.  Psychiatric The patient has no complaints or symptoms. Objective Constitutional Obese and well-hydrated in no acute distress. Vitals Time Taken: 11:36 AM, Height: 66 in, Weight: 298 lbs, BMI: 48.1, Temperature: 98.3 F, Pulse: 74 bpm, Respiratory Adam Merritt, Adam Merritt. ([id number]) Rate: 22 breaths/min, Blood Pressure: 146/63 mmHg. Respiratory normal breathing without difficulty. clear to auscultation bilaterally. Cardiovascular regular rate and rhythm with normal S1, S2. 2+ pitting edema of the bilateral lower extremities. Psychiatric this patient is able to make decisions and demonstrates good insight into disease process. Alert and Oriented x 3. pleasant and cooperative. General Notes: At this point patient's wound again appears to be very superficial as far as the lower extremities are concerned there is no overall worsening that I see and in fact I think most of this appears to be blistering but has ruptured secondary to the lymphedema. Again this is left greater than right. I do believe this will be aided by she should have lymphedema pumps. Integumentary (Hair, Skin) Wound #13 status is Open. Original cause of wound was Gradually Appeared. The wound is located on the  Right Lower Leg. The wound measures 0.1cm length x 0.1cm width x 0.1cm depth; 0.008cm^2 area and 0.001cm^3 volume. There is no tunneling or undermining noted. There is a small amount of serous drainage noted. The wound margin is flat and intact. There is large (67-100%) red granulation within the wound bed. There is no necrotic tissue within the wound bed. The periwound skin appearance exhibited: Hemosiderin Staining. The periwound skin appearance did not exhibit: Callus, Crepitus, Excoriation, Induration, Rash, Scarring, Dry/Scaly, Maceration, Atrophie Blanche, Cyanosis, Ecchymosis, Mottled, Pallor, Rubor, Erythema. Periwound temperature was noted as No Abnormality. Assessment Active Problems ICD-10 Non-pressure chronic ulcer of left calf with fat layer exposed Lymphedema, not elsewhere classified Obesity, unspecified Plan Wound Cleansing: Wound #13 Right Lower Leg: Clean wound with Normal Saline. Cleanse wound with mild soap and water May Shower, gently pat wound dry prior to applying new dressing. Skin Barriers/Peri-Wound Care: Wound #13 Right Lower Leg: Moisturizing lotion Primary Wound Dressing: Wound #13 Right Lower Leg: Hydrafera Blue Ready Transfer Adam Merritt, Adam Merritt ([id number]) Secondary Dressing: Wound #13 Right Lower Leg: ABD pad Dressing Change Frequency: Wound #13 Right Lower Leg: Other: - HHRN to change wraps on Fridays Tuesdays at [place] Follow-up Appointments: Wound #13 Right Lower Leg: Return Appointment in 1 week. Edema Control: Wound #13 Right Lower Leg: 3 Layer Compression System - Bilateral - unna to anchor Additional Orders / Instructions: Wound #13 Right Lower Leg: Activity as tolerated Home Health: Wound #13 Right Lower Leg: Continue Home Health Visits - HHRN to change wraps on Fridays Home Health Nurse may visit PRN to address patient s wound care needs. FACE TO FACE ENCOUNTER: MEDICARE and MEDICAID PATIENTS: I certify that this  patient is under my care and that I had a face-to-face encounter that meets the physician face-to-face encounter requirements with this patient on this date. The encounter with the patient was in whole or in part for the following MEDICAL CONDITION: (primary reason for [place]) MEDICAL NECESSITY: I certify, that based on my findings, NURSING services are a medically necessary home health service. HOME BOUND STATUS: I certify that my clinical findings support that this patient is homebound (i.e., Due to illness or injury, pt requires aid of supportive devices such as crutches, cane, wheelchairs, walkers, the use of special transportation or the assistance of another person to leave their place of residence. There is a normal inability to leave the  home and doing so requires considerable and taxing effort. Other absences are for medical reasons / religious services and are infrequent or of short duration when for other reasons). If current dressing causes regression in wound condition, may D/C ordered dressing product/s and apply Normal Saline Moist Dressing daily until next [place] / Other MD appointment. [place] of regression in wound condition at [phone]. Please direct any NON-WOUND related issues/requests for orders to patient's Primary Care Physician The following medication(s) was prescribed: lidocaine topical 4 % cream 1 1 cream topical was prescribed at facility I'm in a recommend seeing the patient for follow-up in one weeks time we will reevaluate his lower extremities and see were things stand at that point. He is in agreement with plan as is his wife. They are also going to take a trip down to [place] in order to have his legs measured to get compression stockings for once this does heal. Obviously he's wrapped today so I'm not recommending that he go yet for this. Nonetheless if he is healed at the next visit and what we will do is just apply  ace wrap and allow them to go ahead and travel down to [place] in order to get these. Patient and his wife are in agreement with the plan. We will subsequently see him back for reevaluation. Please see above for specific wound care orders. We will see patient for re-evaluation in 1 week(s) here in the clinic. If anything worsens or changes patient will contact our office for additional recommendations. Electronic Signature(s) Signed: 12/19/2017 1:39:21 PM By: Worthy Keeler PA-C Entered By: Worthy Keeler on 12/19/2017 13:26:32 Adam Merritt ([id number]) -------------------------------------------------------------------------------- ROS/PFSH Details Patient Name: Adam Merritt Date of Service: 12/18/2017 11:15 AM Medical Record Number: [id number] Patient Account Number: [id number] Date of Birth/Sex: [birth year] (72 y.o. M) Treating RN: Ahmed Prima Primary Care Provider: [name] Other Clinician: Referring Provider: [name] Treating Provider/Extender: Melburn Hake, Prima Rayner [name]: 8 Information Obtained From Patient Wound History Do you currently have one or more open woundso No Have you tested positive for osteomyelitis (bone infection)o No Have you had any tests for circulation on your legso Yes Who ordered the testo [facility] Where was the test doneo [facility] Have you had other problems associated with your woundso Swelling Constitutional Symptoms (General Health) Complaints and Symptoms: Negative for: Fever; Chills Eyes Medical History: Negative for: Cataracts; Glaucoma; Optic Neuritis Ear/Nose/Mouth/Throat Medical History: Negative for: Chronic sinus problems/congestion; Middle ear problems Hematologic/Lymphatic Medical History: Positive for: Anemia - aplastic anemia; Lymphedema Negative for: Hemophilia; Human Immunodeficiency Virus; Sickle Cell Disease Respiratory Complaints and Symptoms: No Complaints or Symptoms Medical History: Positive for: Chronic  Obstructive Pulmonary Disease (COPD); Sleep Apnea Negative for: Aspiration; Asthma; Pneumothorax; Tuberculosis Past Medical History Notes: home O2 at night as needed Cardiovascular Complaints and Symptoms: No Complaints or Symptoms Medical History: Positive for: Congestive Heart Failure; Coronary Artery Disease; Hypertension; Peripheral Venous Disease Negative for: Angina; Arrhythmia; Deep Vein Thrombosis; Hypotension; Myocardial Infarction; Peripheral Arterial Disease; Adam Merritt, Adam Merritt ([id number]) Phlebitis; Vasculitis Past Medical History Notes: varicose veins Gastrointestinal Medical History: Negative for: Cirrhosis ; Colitis; Crohnos; Hepatitis A; Hepatitis B; Hepatitis C Endocrine Medical History: Positive for: Type II Diabetes Negative for: Type I Diabetes Time with diabetes: since 2006 Treated with: Insulin Blood sugar tested every day: Yes Tested : Blood sugar testing results: Breakfast: 209 Genitourinary Medical History: Negative for: End Stage Renal Disease Immunological Medical History: Negative for: Lupus Erythematosus; Raynaudos;  Scleroderma Integumentary (Skin) Medical History: Negative for: History of Burn; History of pressure wounds Musculoskeletal Medical History: Positive for: Osteoarthritis Negative for: Gout; Rheumatoid Arthritis; Osteomyelitis Neurologic Medical History: Positive for: Neuropathy Negative for: Dementia; Quadriplegia; Paraplegia; Seizure Disorder Past Medical History Notes: CVA - 1992 Oncologic Medical History: Positive for: Received Chemotherapy - last dose 2006 Negative for: Received Radiation Psychiatric Complaints and Symptoms: No Complaints or Symptoms ALVIE, FOWLES ([id number]) Medical History: Negative for: Anorexia/bulimia; Confinement Anxiety Immunizations Pneumococcal Vaccine: Received Pneumococcal Vaccination: Yes Implantable Devices Hospitalization / Surgery History Name of Hospital Purpose of  Hospitalization/Surgery Date [facility] bronchitis 11/03/2013 Family and Social History Cancer: Yes - Paternal Grandparents; Diabetes: No; Heart Disease: No; Hereditary Spherocytosis: No; Hypertension: No; Kidney Disease: Yes - Siblings; Lung Disease: Yes - Mother; Seizures: No; Stroke: No; Thyroid Problems: No; Tuberculosis: No; Never smoker; Marital Status - Married; Alcohol Use: Never; Drug Use: No History; Caffeine Use: Never; Financial Concerns: No; Food, Clothing or Shelter Needs: No; Support System Lacking: No; Transportation Concerns: No; Advanced Directives: No; Patient does not want information on Advanced Directives Physician Affirmation I have reviewed and agree with the above information. Electronic Signature(s) Signed: 12/19/2017 1:39:21 PM By: Worthy Keeler PA-C Signed: 12/19/2017 5:07:56 PM By: Alric Quan Entered By: Worthy Keeler on 12/19/2017 13:25:48 Adam Merritt ([id number]) -------------------------------------------------------------------------------- SuperBill Details Patient Name: Adam Merritt Date of Service: 12/18/2017 Medical Record Number: [id number] Patient Account Number: [id number] Date of Birth/Sex: 04-Jun-1946 (72 y.o. M) Treating RN: Ahmed Prima Primary Care Provider: [name] Other Clinician: Referring Provider: [name] Treating Provider/Extender: Melburn Hake, Bulmaro Feagans [name]: 8 Diagnosis Coding ICD-10 Codes Code Description [phone] Non-pressure chronic ulcer of left calf with fat layer exposed I89.0 Lymphedema, not elsewhere classified E66.9 Obesity, unspecified Facility Procedures CPT4: Description Modifier Quantity Code [id number] BILATERAL: Application of multi-layer venous compression system; leg (below 1 knee), including ankle and foot. Physician Procedures CPT4 Code: 6195093 Description: 26712 - WC PHYS LEVEL 3 - EST PT ICD-10 Diagnosis Description L97.222 Non-pressure chronic ulcer of left calf with fat layer expo  I89.0 Lymphedema, not elsewhere classified E66.9 Obesity, unspecified Modifier: sed Quantity: 1 Electronic Signature(s) Signed: 12/19/2017 1:39:21 PM By: Worthy Keeler PA-C Previous Signature: 12/18/2017 5:28:11 PM Version By: Alric Quan Entered By: Worthy Keeler on 12/19/2017 13:26:57

## 2017-12-20 NOTE — Progress Notes (Signed)
AB, LEAMING ([id number]) Visit Report for 12/18/2017 Arrival Information Details Patient Name: Adam Merritt, Adam Merritt Date of Service: 12/18/2017 11:15 AM Medical Record Number: [id number] Patient Account Number: [id number] Date of Birth/Sex: Apr 15, 1946 (72 y.o. M) Treating RN: Montey Hora Primary Care Jatniel Verastegui: [name] Other Clinician: Referring Jackquelyn Sundberg: [name] Treating Sarajane Fambrough/Extender: Melburn Hake, [name]: 8 Visit Information History Since Last Visit Added or deleted any medications: No Patient Arrived: [place] Any new allergies or adverse reactions: No Arrival Time: 11:34 Had a fall or experienced change in No Accompanied By: wife activities of daily living that may affect Transfer Assistance: None risk of falls: Patient Identification Verified: Yes Signs or symptoms of abuse/neglect since last visito No Secondary Verification Process Completed: Yes Hospitalized since last visit: No Patient Has Alerts: Yes Implantable device outside of the clinic excluding No Patient Alerts: DMII cellular tissue based products placed in the center since last visit: Has Dressing in Place as Prescribed: Yes Has Compression in Place as Prescribed: No Pain Present Now: No Electronic Signature(s) Signed: 12/18/2017 5:46:04 PM By: Montey Hora Entered By: Montey Hora on 12/18/2017 11:35:06 Adam Merritt ([id number]) -------------------------------------------------------------------------------- Encounter Discharge Information Details Patient Name: Adam Merritt Date of Service: 12/18/2017 11:15 AM Medical Record Number: [id number] Patient Account Number: [id number] Date of Birth/Sex: [birth year] (72 y.o. M) Treating RN: Roger Shelter Primary Care Adarryl Goldammer: [name] Other Clinician: Referring Parley Pidcock: [name] Treating Erminie Foulks/Extender: Melburn Hake, [name]: 8 Encounter Discharge Information Items Discharge Condition: Stable Ambulatory  Status: [place] Discharge Destination: Home Transportation: Private Auto Schedule Follow-up Appointment: Yes Clinical Summary of Care: Electronic Signature(s) Signed: 12/18/2017 4:58:15 PM By: Roger Shelter Entered By: Roger Shelter on 12/18/2017 12:35:50 Adam Merritt ([id number]) -------------------------------------------------------------------------------- Lower Extremity Assessment Details Patient Name: Adam Merritt Date of Service: 12/18/2017 11:15 AM Medical Record Number: [id number] Patient Account Number: [id number] Date of Birth/Sex: Jul 03, 1945 (72 y.o. M) Treating RN: Montey Hora Primary Care Birch Farino: [name] Other Clinician: Referring Bryonna Sundby: [name] Treating Asha Grumbine/Extender: Melburn Hake, [name]: 8 Edema Assessment Assessed: [Left: No] [Right: No] [Left: Edema] [Right: :] Calf Left: Right: Point of Measurement: 32 cm From Medial Instep 56.3 cm 53.5 cm Ankle Left: Right: Point of Measurement: 11 cm From Medial Instep 36.4 cm 31.4 cm Vascular Assessment Pulses: Dorsalis Pedis Palpable: [Left:Yes] [Right:Yes] Posterior Tibial Extremity colors, hair growth, and conditions: Extremity Color: [Left:Hyperpigmented] [Right:Hyperpigmented] Hair Growth on Extremity: [Left:No] [Right:No] Temperature of Extremity: [Left:Warm] [Right:Warm] Capillary Refill: [Left:< 3 seconds] [Right:< 3 seconds] Toe Nail Assessment Left: Right: Thick: Yes Yes Discolored: Yes Yes Deformed: No No Improper Length and Hygiene: Yes Yes Electronic Signature(s) Signed: 12/18/2017 5:46:04 PM By: Montey Hora Entered By: Montey Hora on 12/18/2017 11:46:58 Adam Merritt ([id number]) -------------------------------------------------------------------------------- Multi Wound Chart Details Patient Name: Adam Merritt Date of Service: 12/18/2017 11:15 AM Medical Record Number: [id number] Patient Account Number: [id number] Date of Birth/Sex: [birth year] (72  y.o. M) Treating RN: Ahmed Prima Primary Care Jakaree Pickard: [name] Other Clinician: Referring Cathren Sween: [name] Treating Akansha Wyche/Extender: Melburn Hake, [name]: 8 Vital Signs Height(in): 44 Pulse(bpm): 74 Weight(lbs): 298 Blood Pressure(mmHg): 146/63 Body Mass Index(BMI): 48 Temperature(F): 98.3 Respiratory Rate 22 (breaths/min): Photos: [13:No Photos] [N/A:N/A] Wound Location: [13:Right Lower Leg] [N/A:N/A] Wounding Event: [13:Gradually Appeared] [N/A:N/A] Primary Etiology: [13:Lymphedema] [N/A:N/A] Comorbid History: [13:Anemia, Lymphedema, Chronic Obstructive Pulmonary Disease (COPD), Sleep Apnea, Congestive Heart Failure, Coronary Artery Disease, Hypertension, Peripheral Venous Disease, Type II Diabetes, Osteoarthritis, Neuropathy, Received  Chemotherapy] [N/A:N/A] Date Acquired: [  13:08/20/2017] [N/A:N/A] Weeks of Treatment: [13:3] [N/A:N/A] Wound Status: [13:Open] [N/A:N/A] Clustered Wound: [13:Yes] [N/A:N/A] Measurements L x W x D [13:0.1x0.1x0.1] [N/A:N/A] (cm) Area (cm) : [13:0.008] [N/A:N/A] Volume (cm) : [13:0.001] [N/A:N/A] % Reduction in Area: [13:100.00%] [N/A:N/A] % Reduction in Volume: [13:100.00%] [N/A:N/A] Classification: [13:Partial Thickness] [N/A:N/A] Exudate Amount: [13:Small] [N/A:N/A] Exudate Type: [13:Serous] [N/A:N/A] Exudate Color: [13:amber] [N/A:N/A] Wound Margin: [13:Flat and Intact] [N/A:N/A] Granulation Amount: [13:Large (67-100%)] [N/A:N/A] Granulation Quality: [13:Red] [N/A:N/A] Necrotic Amount: [13:None Present (0%)] [N/A:N/A] Exposed Structures: [13:Fascia: No Fat Layer (Subcutaneous Tissue) Exposed: No Tendon: No Muscle: No] [N/A:N/A] Joint: No Bone: No Epithelialization: Medium (34-66%) N/A N/A Periwound Skin Texture: Excoriation: No N/A N/A Induration: No Callus: No Crepitus: No Rash: No Scarring: No Periwound Skin Moisture: Maceration: No N/A N/A Dry/Scaly: No Periwound Skin Color: Hemosiderin  Staining: Yes N/A N/A Atrophie Blanche: No Cyanosis: No Ecchymosis: No Erythema: No Mottled: No Pallor: No Rubor: No Temperature: No Abnormality N/A N/A Tenderness on Palpation: No N/A N/A Wound Preparation: Ulcer Cleansing: Other: soap N/A N/A and water Topical Anesthetic Applied: Other: lidocaine 4% Treatment Notes Electronic Signature(s) Signed: 12/19/2017 5:07:56 PM By: Alric Quan Entered By: Alric Quan on 12/18/2017 12:10:21 Adam Merritt ([id number]) -------------------------------------------------------------------------------- [place] Details Patient Name: Adam Merritt Date of Service: 12/18/2017 11:15 AM Medical Record Number: [id number] Patient Account Number: [id number] Date of Birth/Sex: 23-Apr-1946 (72 y.o. M) Treating RN: Ahmed Prima Primary Care Sheylin Scharnhorst: [name] Other Clinician: Referring Nalla Purdy: [name] Treating Malikiah Debarr/Extender: Melburn Hake, [name]: 8 Active Inactive ` Abuse / Safety / Falls / Self Care Management Nursing Diagnoses: Potential for falls Goals: Patient will not experience any injury related to falls Date Initiated: 10/18/2017 Target Resolution Date: 02/09/2018 Goal Status: Active Interventions: Assess Activities of Daily Living upon admission and as needed Assess fall risk on admission and as needed Assess: immobility, friction, shearing, incontinence upon admission and as needed Assess impairment of mobility on admission and as needed per policy Assess personal safety and home safety (as indicated) on admission and as needed Assess self care needs on admission and as needed Notes: ` Orientation to the Wound Care Program Nursing Diagnoses: Knowledge deficit related to the wound healing center program Goals: Patient/caregiver will verbalize understanding of the [facility] Program Date Initiated: 10/18/2017 Target Resolution Date: 11/10/2017 Goal Status:  Active Interventions: Provide education on orientation to the wound center Notes: ` Wound/Skin Impairment Nursing Diagnoses: Impaired tissue integrity Knowledge deficit related to ulceration/compromised skin integrity Adam Merritt, Adam Merritt ([id number]) Goals: Ulcer/skin breakdown will have a volume reduction of 80% by week 12 Date Initiated: 10/18/2017 Target Resolution Date: 01/05/2018 Goal Status: Active Interventions: Assess patient/caregiver ability to perform ulcer/skin care regimen upon admission and as needed Assess ulceration(s) every visit Notes: Electronic Signature(s) Signed: 12/19/2017 5:07:56 PM By: Alric Quan Entered By: Alric Quan on 12/18/2017 12:10:14 Adam Merritt ([id number]) -------------------------------------------------------------------------------- Pain Assessment Details Patient Name: Adam Merritt Date of Service: 12/18/2017 11:15 AM Medical Record Number: [id number] Patient Account Number: [id number] Date of Birth/Sex: 11/13/1945 (72 y.o. M) Treating RN: Montey Hora Primary Care Clement Deneault: [name] Other Clinician: Referring Vikki Gains: [name] Treating Aneka Fagerstrom/Extender: Melburn Hake, [name]: 8 Active Problems Location of Pain Severity and Description of Pain Patient Has Paino No Site Locations Pain Management and Medication Current Pain Management: Electronic Signature(s) Signed: 12/18/2017 5:46:04 PM By: Montey Hora Entered By: Montey Hora on 12/18/2017 11:36:08 Adam Merritt ([id number]) -------------------------------------------------------------------------------- Patient/Caregiver Education Details Patient Name: Adam Merritt. Date of  Service: 12/18/2017 11:15 AM Medical Record Number: [id number] Patient Account Number: [id number] Date of Birth/Gender: February 06, 1946 (72 y.o. M) Treating RN: Roger Shelter Primary Care Physician: [name] Other Clinician: Referring Physician: [name] Treating  Physician/Extender: Sharalyn Ink in Treatment: 8 Education Assessment Education Provided To: Patient Education Topics Provided Wound/Skin Impairment: Handouts: Caring for Your Ulcer Methods: Explain/Verbal Responses: State content correctly Electronic Signature(s) Signed: 12/18/2017 4:58:15 PM By: Roger Shelter Entered By: Roger Shelter on 12/18/2017 12:36:02 Adam Merritt ([id number]) -------------------------------------------------------------------------------- Wound Assessment Details Patient Name: Adam Merritt Date of Service: 12/18/2017 11:15 AM Medical Record Number: [id number] Patient Account Number: [id number] Date of Birth/Sex: July 31, 1945 (72 y.o. M) Treating RN: Montey Hora Primary Care Aiyanah Kalama: [name] Other Clinician: Referring Erdem Naas: [name] Treating Lavarius Doughten/Extender: Melburn Hake, [name]: 8 Wound Status Wound Number: 13 Primary Lymphedema Etiology: Wound Location: Right Lower Leg Wound Open Wounding Event: Gradually Appeared Status: Date Acquired: 08/20/2017 Comorbid Anemia, Lymphedema, Chronic Obstructive Weeks Of Treatment: 3 History: Pulmonary Disease (COPD), Sleep Apnea, Clustered Wound: Yes Congestive Heart Failure, Coronary Artery Disease, Hypertension, Peripheral Venous Disease, Type II Diabetes, Osteoarthritis, Neuropathy, Received Chemotherapy Wound Measurements Length: (cm) 0.1 Width: (cm) 0.1 Depth: (cm) 0.1 Area: (cm) 0.008 Volume: (cm) 0.001 % Reduction in Area: 100% % Reduction in Volume: 100% Epithelialization: Medium (34-66%) Tunneling: No Undermining: No Wound Description Classification: Partial Thickness Foul Od Wound Margin: Flat and Intact Slough/ Exudate Amount: Small Exudate Type: Serous Exudate Color: amber or After Cleansing: No Fibrino No Wound Bed Granulation Amount: Large (67-100%) Exposed Structure Granulation Quality: Red Fascia Exposed: No Necrotic Amount: None  Present (0%) Fat Layer (Subcutaneous Tissue) Exposed: No Tendon Exposed: No Muscle Exposed: No Joint Exposed: No Bone Exposed: No Periwound Skin Texture Texture Color No Abnormalities Noted: No No Abnormalities Noted: No Callus: No Atrophie Blanche: No Crepitus: No Cyanosis: No Excoriation: No Ecchymosis: No Induration: No Erythema: No Rash: No Hemosiderin Staining: Yes Scarring: No Mottled: No Pallor: No Moisture Rubor: No No Abnormalities Noted: No Adam Merritt, Adam Merritt ([id number]) Dry / Scaly: No Temperature / Pain Maceration: No Temperature: No Abnormality Wound Preparation Ulcer Cleansing: Other: soap and water, Topical Anesthetic Applied: Other: lidocaine 4%, Treatment Notes Wound #13 (Right Lower Leg) 1. Cleansed with: Clean wound with Normal Saline 2. Anesthetic Topical Lidocaine 4% cream to wound bed prior to debridement 4. Dressing Applied: Hydrafera Blue 5. Secondary Dressing Applied ABD Pad 7. Secured with 3 Layer Compression System - Bilateral Electronic Signature(s) Signed: 12/18/2017 5:46:04 PM By: Montey Hora Entered By: Montey Hora on 12/18/2017 11:44:52 Adam Merritt ([id number]) -------------------------------------------------------------------------------- Vitals Details Patient Name: Adam Merritt Date of Service: 12/18/2017 11:15 AM Medical Record Number: [id number] Patient Account Number: [id number] Date of Birth/Sex: 1945-12-31 (72 y.o. M) Treating RN: Montey Hora Primary Care Izrael Peak: [name] Other Clinician: Referring Clint Strupp: [name] Treating Josephene Marrone/Extender: Melburn Hake, [name]: 8 Vital Signs Time Taken: 11:36 Temperature (F): 98.3 Height (in): 66 Pulse (bpm): 74 Weight (lbs): 298 Respiratory Rate (breaths/min): 22 Body Mass Index (BMI): 48.1 Blood Pressure (mmHg): 146/63 Reference Range: 80 - 120 mg / dl Electronic Signature(s) Signed: 12/18/2017 5:46:04 PM By: Montey Hora Entered  By: Montey Hora on 12/18/2017 11:37:47

## 2017-12-21 ENCOUNTER — Other Ambulatory Visit: Payer: Self-pay | Admitting: Internal Medicine

## 2017-12-24 ENCOUNTER — Encounter: Payer: Self-pay | Admitting: Nurse Practitioner

## 2017-12-24 ENCOUNTER — Ambulatory Visit (INDEPENDENT_AMBULATORY_CARE_PROVIDER_SITE_OTHER): Payer: Medicare Other | Admitting: Internal Medicine

## 2017-12-24 VITALS — BP 155/86 | HR 76 | Resp 16 | Ht 66.0 in | Wt 296.0 lb

## 2017-12-24 DIAGNOSIS — G894 Chronic pain syndrome: Secondary | ICD-10-CM | POA: Diagnosis not present

## 2017-12-24 DIAGNOSIS — E1165 Type 2 diabetes mellitus with hyperglycemia: Secondary | ICD-10-CM | POA: Diagnosis not present

## 2017-12-24 DIAGNOSIS — E782 Mixed hyperlipidemia: Secondary | ICD-10-CM

## 2017-12-24 DIAGNOSIS — N183 Chronic kidney disease, stage 3 unspecified: Secondary | ICD-10-CM

## 2017-12-24 DIAGNOSIS — I739 Peripheral vascular disease, unspecified: Secondary | ICD-10-CM

## 2017-12-24 LAB — POCT GLYCOSYLATED HEMOGLOBIN (HGB A1C): HEMOGLOBIN A1C: 6 % — AB (ref 4.0–5.6)

## 2017-12-24 NOTE — Progress Notes (Signed)
[facility] [address], [place] [address]  Internal MEDICINE  Office Visit Note  Patient Name: Adam Merritt  [id number]  [id number]  Date of Service: 12/31/2017  Chief Complaint  Patient presents with  . Diabetes    4 wk follow up  . Medical Management of Chronic Issues  . Medication Management    pain     Diabetes  He presents for his follow-up diabetic visit. He has type 2 (Insulin required ) diabetes mellitus. MedicAlert identification noted. His disease course has been fluctuating. There are no hypoglycemic associated symptoms. Pertinent negatives for hypoglycemia include no nervousness/anxiousness or tremors. Associated symptoms include fatigue and foot ulcerations. Pertinent negatives for diabetes include no chest pain. Symptoms are improving. He has not had a previous visit with a dietitian. He never participates in exercise. An ACE inhibitor/angiotensin II receptor blocker is being taken. He sees a podiatrist.Eye exam is current.   Pt has chronic pain and is on Duragesic patch. He is not taking oxycodone any more. Other problems are in chronic stable condition, non adherence with diet as mostly eat outside   Current Medication: Outpatient Encounter Medications as of 12/24/2017  Medication Sig  . albuterol (PROVENTIL HFA;VENTOLIN HFA) 108 (90 Base) MCG/ACT inhaler Inhale 1 puff into the lungs every 6 (six) hours as needed for wheezing or shortness of breath.  Marland Kitchen aspirin 325 MG EC tablet Take 325 mg by mouth daily.  Marland Kitchen atorvastatin (LIPITOR) 40 MG tablet Take 40 mg by mouth at bedtime.   . carvedilol (COREG) 25 MG tablet Take 25 mg by mouth 2 (two) times daily with a meal.  . docusate sodium (COLACE) 50 MG capsule Take 50 mg by mouth 2 (two) times daily.  . febuxostat (ULORIC) 40 MG tablet Take one tab a day for gout, cannot take allopurinol (Patient taking differently: Take 40 mg by mouth daily. )  . fentaNYL (DURAGESIC - DOSED MCG/HR) 50 MCG/HR Place 1  patch (50 mcg total) onto the skin every 3 (three) days.  . furosemide (LASIX) 40 MG tablet Take one tablet (40mg ) by mouth daily  . gabapentin (NEURONTIN) 400 MG capsule Take 1 capsule (400 mg total) by mouth 3 (three) times daily.  . hydrALAZINE (APRESOLINE) 25 MG tablet TAKE 1 TABLET BY MOUTH THREE TIMES DAILY FOR BLOOD PRESSURE  . ibuprofen (ADVIL,MOTRIN) 600 MG tablet Take 600 mg by mouth 2 (two) times daily as needed for mild pain or moderate pain.   . indapamide (LOZOL) 2.5 MG tablet Take 1 tablet (2.5 mg total) by mouth daily.  . Insulin Glargine (BASAGLAR KWIKPEN) 100 UNIT/ML SOPN Inject 30 Units into the skin at bedtime.  Marland Kitchen ipratropium-albuterol (DUONEB) 0.5-2.5 (3) MG/3ML SOLN Take 3 mLs by nebulization every 4 (four) hours as needed (for shortness of breath).  . Liraglutide (VICTOZA) 18 MG/3ML SOPN Inject 1.8 mg into the skin daily.   . methylPREDNISolone (MEDROL) 4 MG TBPK tablet Take by mouth as directed for 6 days  . metolazone (ZAROXOLYN) 2.5 MG tablet Take 1 tablet (2.5 mg total) by mouth daily.  . Multiple Vitamin (MULTIVITAMIN WITH MINERALS) TABS tablet Take 1 tablet by mouth daily.  Marland Kitchen omeprazole (PRILOSEC) 20 MG capsule Take 20 mg by mouth daily.  . Oxycodone HCl 10 MG TABS Half to one tab as needed prn for pain (Patient taking differently: Half to one tab  prn for pain)  . pentoxifylline (TRENTAL) 400 MG CR tablet TAKE 1 TABLET BY MOUTH THREE TIMES DAILY FOR CLAUDICATION  .  potassium chloride SA (K-DUR,KLOR-CON) 20 MEQ tablet Take 20 mEq by mouth 2 (two) times daily.  . sennosides-docusate sodium (SENOKOT-S) 8.6-50 MG tablet Take 1-2 tablets by mouth 2 (two) times daily.  Marland Kitchen UREA 20 INTENSIVE HYDRATING 20 % cream USE AS DIRECTED 3 TIMES A WEEK  . zolpidem (AMBIEN CR) 12.5 MG CR tablet Take 1 tablet (12.5 mg total) by mouth at bedtime as needed for sleep.  . [DISCONTINUED] fentaNYL (DURAGESIC - DOSED MCG/HR) 50 MCG/HR Place 1 patch (50 mcg total) onto the skin every 3 (three)  days.  . [DISCONTINUED] levofloxacin (LEVAQUIN) 750 MG tablet Take 1 tablet (750 mg total) by mouth daily.  Marland Kitchen amoxicillin-clavulanate (AUGMENTIN) 875-125 MG tablet Take 1 tablet by mouth 2 (two) times daily. (Patient not taking: Reported on 10/25/2017)  . colchicine 0.6 MG tablet Take 1 tablet (0.6 mg total) by mouth daily. (Patient not taking: Reported on 11/27/2017)   No facility-administered encounter medications on file as of 12/24/2017.     Surgical History: Past Surgical History:  Procedure Laterality Date  . CHOLECYSTECTOMY    . CORONARY ARTERY BYPASS GRAFT      Medical History: Past Medical History:  Diagnosis Date  . Anemia   . CAD (coronary artery disease)   . Chest pain   . Coronary artery disease   . Diabetes mellitus without complication ([place])   . Esophageal reflux   . Herpes zoster without mention of complication   . Hyperlipidemia   . Hypertension   . Insomnia   . Lumbago   . Neuropathy in diabetes ([place])   . Obstructive chronic bronchitis without exacerbation ([place])   . Other malaise and fatigue   . Stroke ([place])   . Varicose veins     Family History: Family History  Problem Relation Age of Onset  . Diabetes Mother   . Hyperlipidemia Mother   . Hypertension Mother   . Diabetes Father   . Hyperlipidemia Father   . Hypertension Father     Social History   Socioeconomic History  . Marital status: Married    Spouse name: Not on file  . Number of children: Not on file  . Years of education: Not on file  . Highest education level: Not on file  Occupational History  . Not on file  Social Needs  . Financial resource strain: Not on file  . Food insecurity:    Worry: Not on file    Inability: Not on file  . Transportation needs:    Medical: Not on file    Non-medical: Not on file  Tobacco Use  . Smoking status: Never Smoker  . Smokeless tobacco: Never Used  Substance and Sexual Activity  . Alcohol use: No  . Drug use: No  . Sexual activity: Not  on file  Lifestyle  . Physical activity:    Days per week: Not on file    Minutes per session: Not on file  . Stress: Not on file  Relationships  . Social connections:    Talks on phone: Not on file    Gets together: Not on file    Attends religious service: Not on file    Active member of club or organization: Not on file    Attends meetings of clubs or organizations: Not on file    Relationship status: Not on file  . Intimate partner violence:    Fear of current or ex partner: Not on file    Emotionally abused: Not on file  Physically abused: Not on file    Forced sexual activity: Not on file  Other Topics Concern  . Not on file  Social History Narrative  . Not on file    Review of Systems  Constitutional: Positive for fatigue. Negative for chills and unexpected weight change.  HENT: Positive for postnasal drip. Negative for congestion, rhinorrhea, sneezing and sore throat.   Eyes: Negative for redness.  Respiratory: Negative for cough, chest tightness and shortness of breath.   Cardiovascular: Negative for chest pain and palpitations.  Gastrointestinal: Negative for abdominal pain, constipation, diarrhea, nausea and vomiting.  Genitourinary: Negative for dysuria and frequency.  Musculoskeletal: Positive for joint swelling. Negative for arthralgias, back pain and neck pain.  Skin: Negative for rash.  Neurological: Positive for numbness. Negative for tremors.       Pain in BLE   Hematological: Negative for adenopathy. Does not bruise/bleed easily.  Psychiatric/Behavioral: Negative for behavioral problems (Depression), sleep disturbance and suicidal ideas. The patient is not nervous/anxious.     Vital Signs: BP (!) 155/86 (BP Location: Left Arm, Patient Position: Sitting, Cuff Size: Large)   Pulse 76   Resp 16   Ht 5\' 6"  (1.676 m)   Wt 296 lb (134.3 kg)   SpO2 94%   BMI 47.78 kg/m    Physical Exam  Constitutional: He is oriented to person, place, and time. He  appears well-developed and well-nourished. No distress.  HENT:  Head: Normocephalic and atraumatic.  Mouth/Throat: Oropharynx is clear and moist. No oropharyngeal exudate.  Eyes: Pupils are equal, round, and reactive to light. EOM are normal.  Neck: Normal range of motion. Neck supple. No JVD present. No tracheal deviation present. No thyromegaly present.  Cardiovascular: Normal rate, regular rhythm and normal heart sounds. Exam reveals no gallop and no friction rub.  No murmur heard. Pulmonary/Chest: Effort normal. No respiratory distress. He has no wheezes. He has no rales. He exhibits no tenderness.  Abdominal: Soft. Bowel sounds are normal.  Musculoskeletal: Normal range of motion.  Lymphadenopathy:    He has no cervical adenopathy.  Neurological: He is alert and oriented to person, place, and time. No cranial nerve deficit.  Skin: Capillary refill takes less than 2 seconds. Rash noted. He is not diaphoretic. There is erythema.  Psychiatric: He has a normal mood and affect. His behavior is normal. Judgment and thought content normal.   Assessment/Plan: 1. Uncontrolled type 2 diabetes mellitus with hyperglycemia (HCC) - POCT HgB A1C (6.0) improved from previous visit (7.6)  2. Chronic pain disorder - DC Oxycodone since pain is controlled with duragesic patch and gabapentin - fentaNYL (DURAGESIC - DOSED MCG/HR) 50 MCG/HR; Place 1 patch (50 mcg total) onto the skin every 3 (three) days.  Dispense: 10 patch; Refill: 0  3. Chronic kidney disease, stage III (moderate) (HCC) - Per nephrology but stable   4. Mixed hyperlipidemia - Controlled   5. Peripheral vascular disease, unspecified ([place]) - Pt sees vascular and wound management   General Counseling: Goran verbalizes understanding of the findings of todays visit and agrees with plan of treatment. I have discussed any further diagnostic evaluation that may be needed or ordered today. We also reviewed his medications today. he has been  encouraged to call the office with any questions or concerns that should arise related to todays visit.    Orders Placed This Encounter  Procedures  . POCT HgB A1C    Meds ordered this encounter  Medications  . fentaNYL (DURAGESIC - DOSED MCG/HR) 50  MCG/HR    Sig: Place 1 patch (50 mcg total) onto the skin every 3 (three) days.    Dispense:  10 patch    Refill:  0    Time spent:20 Minutes  Dr [name] Internal medicine

## 2017-12-25 ENCOUNTER — Other Ambulatory Visit: Payer: Self-pay | Admitting: Internal Medicine

## 2017-12-25 ENCOUNTER — Encounter: Payer: Medicare Other | Admitting: Physician Assistant

## 2017-12-25 DIAGNOSIS — E11622 Type 2 diabetes mellitus with other skin ulcer: Secondary | ICD-10-CM | POA: Diagnosis not present

## 2017-12-25 DIAGNOSIS — R600 Localized edema: Secondary | ICD-10-CM

## 2017-12-25 NOTE — Telephone Encounter (Signed)
Ok to fill  This med

## 2017-12-27 ENCOUNTER — Ambulatory Visit: Payer: Self-pay | Admitting: Internal Medicine

## 2017-12-31 ENCOUNTER — Telehealth: Payer: Self-pay | Admitting: Internal Medicine

## 2017-12-31 MED ORDER — FENTANYL 50 MCG/HR TD PT72: 50.0000 ug | MEDICATED_PATCH | TRANSDERMAL | 0 refills | Status: DC

## 2017-12-31 NOTE — Telephone Encounter (Signed)
SIGNED CMN AND PLAN OF CARE PUT INTO WELL CARE FOLDER TO BE PICKED UP.JW

## 2018-01-28 ENCOUNTER — Encounter: Payer: Self-pay | Admitting: Nurse Practitioner

## 2018-01-28 ENCOUNTER — Ambulatory Visit (INDEPENDENT_AMBULATORY_CARE_PROVIDER_SITE_OTHER): Payer: Medicare Other | Admitting: Nurse Practitioner

## 2018-01-28 VITALS — BP 153/76 | HR 79 | Resp 16 | Ht 63.0 in | Wt 314.6 lb

## 2018-01-28 DIAGNOSIS — G894 Chronic pain syndrome: Secondary | ICD-10-CM | POA: Diagnosis not present

## 2018-01-28 DIAGNOSIS — I890 Lymphedema, not elsewhere classified: Secondary | ICD-10-CM | POA: Diagnosis not present

## 2018-01-28 DIAGNOSIS — I10 Essential (primary) hypertension: Secondary | ICD-10-CM | POA: Insufficient documentation

## 2018-01-28 DIAGNOSIS — E1142 Type 2 diabetes mellitus with diabetic polyneuropathy: Secondary | ICD-10-CM

## 2018-01-28 MED ORDER — FENTANYL 62.5 MCG/HR TD PT72: 62.5000 ug | MEDICATED_PATCH | TRANSDERMAL | 0 refills | Status: DC

## 2018-01-28 MED ORDER — FENTANYL 62.5 MCG/HR TD PT72: 50.0000 ug | MEDICATED_PATCH | TRANSDERMAL | 0 refills | Status: DC

## 2018-01-28 NOTE — Progress Notes (Signed)
[facility] [address] [place], [place] [address]  Internal MEDICINE  Office Visit Note  Patient Name: Adam Merritt  [id number]  [id number]  Date of Service: 01/28/2018  Chief Complaint  Patient presents with  . COPD    5wk follow up  . Quality Metric Gaps    OT plan of care    The patient is c/o increased pain in both legs. Currently on fentanyl 77mcg patches, changed every 3 days. Initially working very well, but now, pain in legs and feet is starting to get worse. He states that pain is about 8/10 in severity, can be as low as 5 or 6/10 in severity with the fentanyl patch. Would do better if pain was at level of 3/10 in severity.       Current Medication: Outpatient Encounter Medications as of 01/28/2018  Medication Sig  . albuterol (PROVENTIL HFA;VENTOLIN HFA) 108 (90 Base) MCG/ACT inhaler Inhale 1 puff into the lungs every 6 (six) hours as needed for wheezing or shortness of breath.  Marland Kitchen aspirin 325 MG EC tablet Take 325 mg by mouth daily.  Marland Kitchen atorvastatin (LIPITOR) 40 MG tablet Take 40 mg by mouth at bedtime.   . carvedilol (COREG) 25 MG tablet Take 25 mg by mouth 2 (two) times daily with a meal.  . docusate sodium (COLACE) 50 MG capsule Take 50 mg by mouth 2 (two) times daily.  . febuxostat (ULORIC) 40 MG tablet Take one tab a day for gout, cannot take allopurinol (Patient taking differently: Take 40 mg by mouth daily. )  . fentaNYL 62.5 MCG/HR PT72 Place 62.5 mcg onto the skin every 3 (three) days.  . furosemide (LASIX) 40 MG tablet Take one tablet (40mg ) by mouth daily  . gabapentin (NEURONTIN) 400 MG capsule Take 1 capsule (400 mg total) by mouth 3 (three) times daily.  . hydrALAZINE (APRESOLINE) 25 MG tablet TAKE 1 TABLET BY MOUTH THREE TIMES DAILY FOR BLOOD PRESSURE  . ibuprofen (ADVIL,MOTRIN) 600 MG tablet Take 600 mg by mouth 2 (two) times daily as needed for mild pain or moderate pain.   . indapamide (LOZOL) 2.5 MG tablet Take 1 tablet (2.5 mg total) by  mouth daily.  . Insulin Glargine (BASAGLAR KWIKPEN) 100 UNIT/ML SOPN Inject 30 Units into the skin at bedtime.  Marland Kitchen ipratropium-albuterol (DUONEB) 0.5-2.5 (3) MG/3ML SOLN Take 3 mLs by nebulization every 4 (four) hours as needed (for shortness of breath).  . Liraglutide (VICTOZA) 18 MG/3ML SOPN Inject 1.8 mg into the skin daily.   . methylPREDNISolone (MEDROL) 4 MG TBPK tablet Take by mouth as directed for 6 days  . metolazone (ZAROXOLYN) 2.5 MG tablet Take 1 tablet (2.5 mg total) by mouth daily.  . metolazone (ZAROXOLYN) 2.5 MG tablet TAKE 1 TABLET BY MOUTH DAILY  . Multiple Vitamin (MULTIVITAMIN WITH MINERALS) TABS tablet Take 1 tablet by mouth daily.  Marland Kitchen omeprazole (PRILOSEC) 20 MG capsule Take 20 mg by mouth daily.  . Oxycodone HCl 10 MG TABS Half to one tab as needed prn for pain (Patient taking differently: Half to one tab  prn for pain)  . pentoxifylline (TRENTAL) 400 MG CR tablet TAKE 1 TABLET BY MOUTH THREE TIMES DAILY FOR CLAUDICATION  . potassium chloride SA (K-DUR,KLOR-CON) 20 MEQ tablet Take 20 mEq by mouth 2 (two) times daily.  . sennosides-docusate sodium (SENOKOT-S) 8.6-50 MG tablet Take 1-2 tablets by mouth 2 (two) times daily.  Marland Kitchen UREA 20 INTENSIVE HYDRATING 20 % cream USE AS DIRECTED 3 TIMES  A WEEK  . zolpidem (AMBIEN CR) 12.5 MG CR tablet Take 1 tablet (12.5 mg total) by mouth at bedtime as needed for sleep.  . [DISCONTINUED] fentaNYL (DURAGESIC - DOSED MCG/HR) 50 MCG/HR Place 1 patch (50 mcg total) onto the skin every 3 (three) days.  . [DISCONTINUED] fentaNYL 62.5 MCG/HR PT72 Place 50 mcg onto the skin every 3 (three) days.  Marland Kitchen amoxicillin-clavulanate (AUGMENTIN) 875-125 MG tablet Take 1 tablet by mouth 2 (two) times daily. (Patient not taking: Reported on 10/25/2017)  . colchicine 0.6 MG tablet Take 1 tablet (0.6 mg total) by mouth daily. (Patient not taking: Reported on 11/27/2017)   No facility-administered encounter medications on file as of 01/28/2018.     Surgical  History: Past Surgical History:  Procedure Laterality Date  . CHOLECYSTECTOMY    . CORONARY ARTERY BYPASS GRAFT      Medical History: Past Medical History:  Diagnosis Date  . Anemia   . CAD (coronary artery disease)   . Chest pain   . Coronary artery disease   . Diabetes mellitus without complication ([place])   . Esophageal reflux   . Herpes zoster without mention of complication   . Hyperlipidemia   . Hypertension   . Insomnia   . Lumbago   . Neuropathy in diabetes ([place])   . Obstructive chronic bronchitis without exacerbation ([place])   . Other malaise and fatigue   . Stroke ([place])   . Varicose veins     Family History: Family History  Problem Relation Age of Onset  . Diabetes Mother   . Hyperlipidemia Mother   . Hypertension Mother   . Diabetes Father   . Hyperlipidemia Father   . Hypertension Father     Social History   Socioeconomic History  . Marital status: Married    Spouse name: Not on file  . Number of children: Not on file  . Years of education: Not on file  . Highest education level: Not on file  Occupational History  . Not on file  Social Needs  . Financial resource strain: Not on file  . Food insecurity:    Worry: Not on file    Inability: Not on file  . Transportation needs:    Medical: Not on file    Non-medical: Not on file  Tobacco Use  . Smoking status: Never Smoker  . Smokeless tobacco: Never Used  Substance and Sexual Activity  . Alcohol use: No  . Drug use: No  . Sexual activity: Not on file  Lifestyle  . Physical activity:    Days per week: Not on file    Minutes per session: Not on file  . Stress: Not on file  Relationships  . Social connections:    Talks on phone: Not on file    Gets together: Not on file    Attends religious service: Not on file    Active member of club or organization: Not on file    Attends meetings of clubs or organizations: Not on file    Relationship status: Not on file  . Intimate partner violence:     Fear of current or ex partner: Not on file    Emotionally abused: Not on file    Physically abused: Not on file    Forced sexual activity: Not on file  Other Topics Concern  . Not on file  Social History Narrative  . Not on file      Review of Systems  Constitutional: Positive for fatigue. Negative  for activity change.  HENT: Negative for congestion and postnasal drip.   Eyes: Negative.   Respiratory: Positive for shortness of breath. Negative for cough, chest tightness and wheezing.   Cardiovascular: Positive for leg swelling. Negative for chest pain and palpitations.       Elevated blood pressure.  Gastrointestinal: Negative for constipation, diarrhea, nausea and vomiting.  Endocrine:       Blood sugars doing well   Genitourinary: Negative for flank pain, frequency and urgency.  Musculoskeletal: Positive for arthralgias, back pain and myalgias.  Skin: Positive for rash.  Allergic/Immunologic: Positive for environmental allergies.  Neurological: Negative for dizziness, light-headedness and headaches.  Hematological: Negative for adenopathy.  Psychiatric/Behavioral: Negative for agitation and dysphoric mood. The patient is not nervous/anxious.     Today's Vitals   01/28/18 1112  BP: (!) 153/76  Pulse: 79  Resp: 16  SpO2: 96%  Weight: (!) 314 lb 9.6 oz (142.7 kg)  Height: 5\' 3"  (1.6 m)   Physical Exam  Constitutional: He is oriented to person, place, and time. He appears well-developed and well-nourished. He appears ill. No distress.  HENT:  Head: Normocephalic and atraumatic.  Nose: Nose normal.  Mouth/Throat: Oropharynx is clear and moist. No oropharyngeal exudate.  Eyes: Pupils are equal, round, and reactive to light. Conjunctivae and EOM are normal.  Neck: Normal range of motion. Neck supple. No JVD present. No tracheal deviation present. No thyromegaly present.  Cardiovascular: Normal rate, regular rhythm and normal heart sounds. Exam reveals no gallop and no  friction rub.  No murmur heard. Moderate swelling in bilateral lower legs. Wearing compression stockings on both legs. Tender to touch.  Pulmonary/Chest: Effort normal and breath sounds normal. No respiratory distress. He has no rales. He exhibits no tenderness.  Abdominal: Soft. Bowel sounds are normal. There is no tenderness.  Musculoskeletal: Normal range of motion.  Moderate to severe lower leg pain, bilaterally. Worse when standing and putting pressure on both feet. Walking increases pain.    Lymphadenopathy:    He has no cervical adenopathy.  Neurological: He is alert and oriented to person, place, and time. No cranial nerve deficit.  Skin: Skin is warm and dry. He is not diaphoretic.  Psychiatric: He has a normal mood and affect. His behavior is normal. Judgment and thought content normal.  Nursing note and vitals reviewed.  Assessment/Plan: 1. Diabetic polyneuropathy associated with type 2 diabetes mellitus ([place]) Will continue diabetic medications and gabapentin as prescribed.   2. Chronic pain disorder Bilateral leg pain getting more severe. Increase patches to 62.22mcg every 3 days. Reassess and adjust as indicated at next visit.  - fentaNYL 62.5 MCG/HR PT72; Place 62.16mcg onto the skin every 3 (three) days.  Dispense: 10 patch; Refill: 0  3. Lymphedema of both lower extremities Continue wearing compression stockings and take all medications as prescribed.   4. Essential hypertension Generally well controlled. Continue bp medication as prescribe .  General Counseling: Arael verbalizes understanding of the findings of todays visit and agrees with plan of treatment. I have discussed any further diagnostic evaluation that may be needed or ordered today. We also reviewed his medications today. he has been encouraged to call the office with any questions or concerns that should arise related to todays visit.   Reviewed risks and possible side effects associated with taking opiates  and benzodiazepines. Combination of these could cause dizziness and drowsiness. Advised patient not to drive or operate machinery when taking these medications, as patient's and other's life can  be at risk and will have consequences. Patient verbalized understanding in this matter.   This patient was seen by [place] with Dr [name] as a part of collaborative care agreement   Meds ordered this encounter  Medications  . DISCONTD: fentaNYL 62.5 MCG/HR PT72    Sig: Place 50 mcg onto the skin every 3 (three) days.    Dispense:  10 patch    Refill:  0    Order Specific Question:   Supervising Provider    Answer:   [name] [[id number]]  . fentaNYL 62.5 MCG/HR PT72    Sig: Place 62.5 mcg onto the skin every 3 (three) days.    Dispense:  10 patch    Refill:  0    Please disregard first rx. Should have 66.13mcg patches.    Order Specific Question:   Supervising Provider    Answer:   [name] [[id number]]    Time spent: 28 Minutes      Dr [name] Internal medicine

## 2018-02-10 ENCOUNTER — Other Ambulatory Visit: Payer: Self-pay | Admitting: Internal Medicine

## 2018-02-12 ENCOUNTER — Encounter: Payer: Self-pay | Admitting: Internal Medicine

## 2018-02-12 ENCOUNTER — Ambulatory Visit (INDEPENDENT_AMBULATORY_CARE_PROVIDER_SITE_OTHER): Payer: Medicare Other | Admitting: Internal Medicine

## 2018-02-12 ENCOUNTER — Telehealth: Payer: Self-pay

## 2018-02-12 VITALS — BP 169/89 | HR 78 | Resp 16 | Ht 66.0 in | Wt 323.6 lb

## 2018-02-12 DIAGNOSIS — G894 Chronic pain syndrome: Secondary | ICD-10-CM | POA: Diagnosis not present

## 2018-02-12 DIAGNOSIS — I890 Lymphedema, not elsewhere classified: Secondary | ICD-10-CM | POA: Diagnosis not present

## 2018-02-12 DIAGNOSIS — E1142 Type 2 diabetes mellitus with diabetic polyneuropathy: Secondary | ICD-10-CM

## 2018-02-12 DIAGNOSIS — I10 Essential (primary) hypertension: Secondary | ICD-10-CM

## 2018-02-12 MED ORDER — METOLAZONE 2.5 MG PO TABS: 2.5000 mg | ORAL_TABLET | Freq: Every day | ORAL | 3 refills | Status: DC

## 2018-02-12 MED ORDER — FENTANYL 62.5 MCG/HR TD PT72
62.5000 ug | MEDICATED_PATCH | TRANSDERMAL | 0 refills | Status: DC
Start: 2018-02-12 — End: 2018-03-08

## 2018-02-12 MED ORDER — GABAPENTIN 300 MG PO CAPS: 300.0000 mg | ORAL_CAPSULE | Freq: Two times a day (BID) | ORAL | 3 refills | Status: DC

## 2018-02-12 MED ORDER — FEBUXOSTAT 40 MG PO TABS: 40.0000 mg | ORAL_TABLET | Freq: Every day | ORAL | 3 refills | Status: DC

## 2018-02-12 MED ORDER — HYDRALAZINE HCL 25 MG PO TABS: ORAL_TABLET | ORAL | 3 refills | Status: DC

## 2018-02-12 NOTE — Progress Notes (Signed)
[facility] [place], [place] [address]  Internal MEDICINE  Office Visit Note  Patient Name: Adam Merritt  [id number]  [id number]  Date of Service: 03/03/2018  Chief Complaint  Patient presents with  . Medical Management of Chronic Issues    4wk follow up increased fentanyol patch    Pt has complex medical issues. He continues to gain weight. Requires chronic pain management. Eats from outside, BP is elevated today, wife manges his medications. Went to [place] and had labs done, does show worsening renal functions. He is unable to walk due to severe chronic lymphedema of bilateral lower extremities. He also has diabetic neuropathy. Last hg a1c is 6.0   Current Medication: Outpatient Encounter Medications as of 02/12/2018  Medication Sig  . albuterol (PROVENTIL HFA;VENTOLIN HFA) 108 (90 Base) MCG/ACT inhaler Inhale 1 puff into the lungs every 6 (six) hours as needed for wheezing or shortness of breath.  Marland Kitchen aspirin 325 MG EC tablet Take 325 mg by mouth daily.  Marland Kitchen atorvastatin (LIPITOR) 40 MG tablet Take 40 mg by mouth at bedtime.   . carvedilol (COREG) 25 MG tablet Take 25 mg by mouth 2 (two) times daily with a meal.  . docusate sodium (COLACE) 50 MG capsule Take 50 mg by mouth 2 (two) times daily.  . fentaNYL 62.5 MCG/HR PT72 Place 62.5 mcg onto the skin every 3 (three) days.  Marland Kitchen gabapentin (NEURONTIN) 300 MG capsule Take 1 capsule (300 mg total) by mouth 2 (two) times daily.  . hydrALAZINE (APRESOLINE) 25 MG tablet TAKE 1 TABLET BY MOUTH THREE TIMES DAILY FOR BLOOD PRESSURE  . ibuprofen (ADVIL,MOTRIN) 600 MG tablet Take 600 mg by mouth 2 (two) times daily as needed for mild pain or moderate pain.   . indapamide (LOZOL) 2.5 MG tablet Take 1 tablet (2.5 mg total) by mouth daily.  Marland Kitchen ipratropium-albuterol (DUONEB) 0.5-2.5 (3) MG/3ML SOLN Take 3 mLs by nebulization every 4 (four) hours as needed (for shortness of breath).  . metolazone (ZAROXOLYN) 2.5 MG tablet TAKE 1  TABLET BY MOUTH DAILY  . metolazone (ZAROXOLYN) 2.5 MG tablet Take 1 tablet (2.5 mg total) by mouth daily.  . Multiple Vitamin (MULTIVITAMIN WITH MINERALS) TABS tablet Take 1 tablet by mouth daily.  Marland Kitchen omeprazole (PRILOSEC) 20 MG capsule Take 20 mg by mouth daily.  . Oxycodone HCl 10 MG TABS Half to one tab as needed prn for pain (Patient taking differently: Half to one tab  prn for pain)  . pentoxifylline (TRENTAL) 400 MG CR tablet TAKE 1 TABLET BY MOUTH THREE TIMES DAILY FOR CLAUDICATION  . potassium chloride SA (K-DUR,KLOR-CON) 20 MEQ tablet Take 20 mEq by mouth 2 (two) times daily.  . sennosides-docusate sodium (SENOKOT-S) 8.6-50 MG tablet Take 1-2 tablets by mouth 2 (two) times daily.  Marland Kitchen UREA 20 INTENSIVE HYDRATING 20 % cream USE AS DIRECTED 3 TIMES A WEEK  . VICTOZA 18 MG/3ML SOPN INJECT 1.8 MG UNDER THE SKIN EVERY DAY  . zolpidem (AMBIEN CR) 12.5 MG CR tablet Take 1 tablet (12.5 mg total) by mouth at bedtime as needed for sleep.  . [DISCONTINUED] febuxostat (ULORIC) 40 MG tablet Take one tab a day for gout, cannot take allopurinol (Patient taking differently: Take 40 mg by mouth daily. )  . [DISCONTINUED] fentaNYL 62.5 MCG/HR PT72 Place 62.5 mcg onto the skin every 3 (three) days.  . [DISCONTINUED] gabapentin (NEURONTIN) 400 MG capsule Take 1 capsule (400 mg total) by mouth 3 (three) times daily.  . [  DISCONTINUED] hydrALAZINE (APRESOLINE) 25 MG tablet TAKE 1 TABLET BY MOUTH THREE TIMES DAILY FOR BLOOD PRESSURE  . [DISCONTINUED] Insulin Glargine (BASAGLAR KWIKPEN) 100 UNIT/ML SOPN Inject 30 Units into the skin at bedtime.  . [DISCONTINUED] metolazone (ZAROXOLYN) 2.5 MG tablet Take 1 tablet (2.5 mg total) by mouth daily.  Marland Kitchen amoxicillin-clavulanate (AUGMENTIN) 875-125 MG tablet Take 1 tablet by mouth 2 (two) times daily. (Patient not taking: Reported on 10/25/2017)  . colchicine 0.6 MG tablet Take 1 tablet (0.6 mg total) by mouth daily. (Patient not taking: Reported on 11/27/2017)  . febuxostat  (ULORIC) 40 MG tablet Take 1 tablet (40 mg total) by mouth daily.  . methylPREDNISolone (MEDROL) 4 MG TBPK tablet Take by mouth as directed for 6 days (Patient not taking: Reported on 02/12/2018)  . [DISCONTINUED] furosemide (LASIX) 40 MG tablet Take one tablet (40mg ) by mouth daily (Patient not taking: Reported on 02/12/2018)   No facility-administered encounter medications on file as of 02/12/2018.     Surgical History: Past Surgical History:  Procedure Laterality Date  . CHOLECYSTECTOMY    . CORONARY ARTERY BYPASS GRAFT      Medical History: Past Medical History:  Diagnosis Date  . Anemia   . CAD (coronary artery disease)   . Chest pain   . Coronary artery disease   . Diabetes mellitus without complication ([place])   . Esophageal reflux   . Herpes zoster without mention of complication   . Hyperlipidemia   . Hypertension   . Insomnia   . Lumbago   . Neuropathy in diabetes ([place])   . Obstructive chronic bronchitis without exacerbation ([place])   . Other malaise and fatigue   . Stroke ([place])   . Varicose veins     Family History: Family History  Problem Relation Age of Onset  . Diabetes Mother   . Hyperlipidemia Mother   . Hypertension Mother   . Diabetes Father   . Hyperlipidemia Father   . Hypertension Father     Social History   Socioeconomic History  . Marital status: Married    Spouse name: Not on file  . Number of children: Not on file  . Years of education: Not on file  . Highest education level: Not on file  Occupational History  . Not on file  Social Needs  . Financial resource strain: Not on file  . Food insecurity:    Worry: Not on file    Inability: Not on file  . Transportation needs:    Medical: Not on file    Non-medical: Not on file  Tobacco Use  . Smoking status: Never Smoker  . Smokeless tobacco: Never Used  Substance and Sexual Activity  . Alcohol use: No  . Drug use: No  . Sexual activity: Not on file  Lifestyle  . Physical activity:     Days per week: Not on file    Minutes per session: Not on file  . Stress: Not on file  Relationships  . Social connections:    Talks on phone: Not on file    Gets together: Not on file    Attends religious service: Not on file    Active member of club or organization: Not on file    Attends meetings of clubs or organizations: Not on file    Relationship status: Not on file  . Intimate partner violence:    Fear of current or ex partner: Not on file    Emotionally abused: Not on file  Physically abused: Not on file    Forced sexual activity: Not on file  Other Topics Concern  . Not on file  Social History Narrative  . Not on file   Review of Systems  Constitutional: Negative for chills, fatigue and unexpected weight change.  HENT: Negative for congestion, postnasal drip, rhinorrhea, sneezing and sore throat.   Eyes: Negative for redness.  Respiratory: Negative for cough, chest tightness and shortness of breath.   Cardiovascular: Positive for leg swelling. Negative for chest pain and palpitations.  Gastrointestinal: Negative for abdominal pain, constipation, diarrhea, nausea and vomiting.  Genitourinary: Negative for dysuria and frequency.  Musculoskeletal: Positive for arthralgias. Negative for back pain, joint swelling and neck pain.  Skin: Negative for rash.  Neurological: Negative.  Negative for tremors and numbness.  Hematological: Negative for adenopathy. Does not bruise/bleed easily.  Psychiatric/Behavioral: Negative for behavioral problems (Depression), sleep disturbance and suicidal ideas. The patient is not nervous/anxious.    Vital Signs: BP (!) 169/89 (BP Location: Left Arm, Patient Position: Sitting, Cuff Size: Large)   Pulse 78   Resp 16   Ht 5\' 6"  (1.676 m)   Wt (!) 323 lb 9.6 oz (146.8 kg)   SpO2 96%   BMI 52.23 kg/m    Physical Exam  Constitutional: He is oriented to person, place, and time. No distress.  Morbidly obese   HENT:  Head:  Normocephalic and atraumatic.  Mouth/Throat: Oropharynx is clear and moist. No oropharyngeal exudate.  Eyes: Pupils are equal, round, and reactive to light. EOM are normal.  Neck: Normal range of motion. Neck supple. No JVD present. No tracheal deviation present. No thyromegaly present.  Cardiovascular: Normal rate, regular rhythm and normal heart sounds. Exam reveals no gallop and no friction rub.  No murmur heard. Pulmonary/Chest: Effort normal. No respiratory distress. He has no wheezes. He has no rales. He exhibits no tenderness.  Abdominal: Soft. Bowel sounds are normal.  Musculoskeletal: Normal range of motion. He exhibits edema.  Lymphadenopathy:    He has no cervical adenopathy.  Neurological: He is alert and oriented to person, place, and time. No cranial nerve deficit.  Skin: Skin is warm and dry. Rash noted. He is not diaphoretic. There is erythema.  Psychiatric: He has a normal mood and affect.   Assessment/Plan: 1. Diabetic polyneuropathy associated with type 2 diabetes mellitus ([place]) Continue all meds as before for diabetic control, instructed to reduce gabapentin as needed since it has caused him to gain and retain fluid - gabapentin (NEURONTIN) 300 MG capsule; Take 1 capsule (300 mg total) by mouth 2 (two) times daily.  Dispense: 180 capsule; Refill:   2. Lymphedema of both lower extremities Continue all diuretics as before - metolazone (ZAROXOLYN) 2.5 MG tablet; Take 1 tablet (2.5 mg total) by mouth daily.  Dispense: 90 tablet; Refill: 3  3. Chronic pain disorder Continue to use patch, - fentaNYL 62.5 MCG/HR PT72; Place 62.5 mcg onto the skin every 3 (three) days.  Dispense: 15 patch; Refill: 0  4. Essential hypertension Elevated today, will monitor at home   5. Morbid obesity due to excess calories ([place]) Pt has been very non adherent to his diet   General Counseling: Adam Merritt verbalizes understanding of the findings of todays visit and agrees with plan of treatment. I  have discussed any further diagnostic evaluation that may be needed or ordered today. We also reviewed his medications today. he has been encouraged to call the office with any questions or concerns that should arise related  to todays visit.     Meds ordered this encounter  Medications  . febuxostat (ULORIC) 40 MG tablet    Sig: Take 1 tablet (40 mg total) by mouth daily.    Dispense:  90 tablet    Refill:  3  . gabapentin (NEURONTIN) 300 MG capsule    Sig: Take 1 capsule (300 mg total) by mouth 2 (two) times daily.    Dispense:  180 capsule    Refill:  3  . hydrALAZINE (APRESOLINE) 25 MG tablet    Sig: TAKE 1 TABLET BY MOUTH THREE TIMES DAILY FOR BLOOD PRESSURE    Dispense:  270 tablet    Refill:  3  . metolazone (ZAROXOLYN) 2.5 MG tablet    Sig: Take 1 tablet (2.5 mg total) by mouth daily.    Dispense:  90 tablet    Refill:  3  . fentaNYL 62.5 MCG/HR PT72    Sig: Place 62.5 mcg onto the skin every 3 (three) days.    Dispense:  15 patch    Refill:  0    Please disregard first rx. Should have 97.22mcg patches.    Time spent:25  Minutes      Dr [name] Internal medicine

## 2018-02-12 NOTE — Telephone Encounter (Signed)
[facility] SIGNED CMN FOR HOME HEALTH CARE. IN WELL CARE FOLDER °

## 2018-02-17 ENCOUNTER — Other Ambulatory Visit: Payer: Self-pay | Admitting: Internal Medicine

## 2018-03-08 ENCOUNTER — Encounter: Payer: Self-pay | Admitting: Adult Health

## 2018-03-08 ENCOUNTER — Ambulatory Visit (INDEPENDENT_AMBULATORY_CARE_PROVIDER_SITE_OTHER): Payer: Medicare Other | Admitting: Adult Health

## 2018-03-08 VITALS — BP 152/70 | HR 64 | Temp 96.6°F | Resp 16 | Ht 66.0 in | Wt 318.4 lb

## 2018-03-08 DIAGNOSIS — G894 Chronic pain syndrome: Secondary | ICD-10-CM | POA: Diagnosis not present

## 2018-03-08 DIAGNOSIS — E1142 Type 2 diabetes mellitus with diabetic polyneuropathy: Secondary | ICD-10-CM | POA: Diagnosis not present

## 2018-03-08 DIAGNOSIS — Z23 Encounter for immunization: Secondary | ICD-10-CM | POA: Diagnosis not present

## 2018-03-08 DIAGNOSIS — I10 Essential (primary) hypertension: Secondary | ICD-10-CM

## 2018-03-08 DIAGNOSIS — F5101 Primary insomnia: Secondary | ICD-10-CM

## 2018-03-08 MED ORDER — ZOLPIDEM TARTRATE ER 12.5 MG PO TBCR: 12.5000 mg | EXTENDED_RELEASE_TABLET | Freq: Every evening | ORAL | 3 refills | Status: DC | PRN

## 2018-03-08 MED ORDER — FENTANYL 50 MCG/HR TD PT72: 50.0000 ug | MEDICATED_PATCH | TRANSDERMAL | 0 refills | Status: DC

## 2018-03-08 NOTE — Progress Notes (Addendum)
[facility] [place], [place] [address]  Internal MEDICINE  Office Visit Note  Patient Name: Adam Merritt  [id number]  [id number]  Date of Service: 03/13/2018  Chief Complaint  Patient presents with  . Ankle Pain    right ankle pain , pt was seen by cardiologist yesterday , medication changes and needs refills   HPI Pt is here for a sick visit. Pt reports right ankle pain x 7 days.  He denies fall or trauma.  He states it "just started hurting".  He has been taking ibuprofen with some relief. He sits frequently with his feet down at a computer or watching TV.  Discussed elevating feet. He has limited mobility, and uses a cane when walking.   Current Medication:  Outpatient Encounter Medications as of 03/08/2018  Medication Sig  . aspirin 325 MG EC tablet Take 325 mg by mouth daily.  Marland Kitchen atorvastatin (LIPITOR) 40 MG tablet Take 40 mg by mouth at bedtime.   . carvedilol (COREG) 25 MG tablet Take 25 mg by mouth 2 (two) times daily with a meal.  . docusate sodium (COLACE) 50 MG capsule Take 50 mg by mouth 2 (two) times daily.  . febuxostat (ULORIC) 40 MG tablet Take 1 tablet (40 mg total) by mouth daily.  . furosemide (LASIX) 20 MG tablet Take 20 mg by mouth.  . gabapentin (NEURONTIN) 300 MG capsule Take 1 capsule (300 mg total) by mouth 2 (two) times daily.  . hydrALAZINE (APRESOLINE) 25 MG tablet TAKE 1 TABLET BY MOUTH THREE TIMES DAILY FOR BLOOD PRESSURE  . ibuprofen (ADVIL,MOTRIN) 600 MG tablet Take 600 mg by mouth 2 (two) times daily as needed for mild pain or moderate pain.   . indapamide (LOZOL) 2.5 MG tablet Take 1 tablet (2.5 mg total) by mouth daily.  . Insulin Glargine (BASAGLAR KWIKPEN) 100 UNIT/ML SOPN INJECT 30 TO 36 UNITS UNDER THE SKIN EVERY DAY  . ipratropium-albuterol (DUONEB) 0.5-2.5 (3) MG/3ML SOLN Take 3 mLs by nebulization every 4 (four) hours as needed (for shortness of breath).  . metolazone (ZAROXOLYN) 2.5 MG tablet TAKE 1 TABLET BY MOUTH  DAILY  . metolazone (ZAROXOLYN) 2.5 MG tablet Take 1 tablet (2.5 mg total) by mouth daily.  . Multiple Vitamin (MULTIVITAMIN WITH MINERALS) TABS tablet Take 1 tablet by mouth daily.  Marland Kitchen omeprazole (PRILOSEC) 20 MG capsule Take 20 mg by mouth daily.  . Oxycodone HCl 10 MG TABS Half to one tab as needed prn for pain (Patient taking differently: Half to one tab  prn for pain)  . potassium chloride SA (K-DUR,KLOR-CON) 20 MEQ tablet Take 20 mEq by mouth 2 (two) times daily.  . sennosides-docusate sodium (SENOKOT-S) 8.6-50 MG tablet Take 1-2 tablets by mouth 2 (two) times daily.  Marland Kitchen UREA 20 INTENSIVE HYDRATING 20 % cream USE AS DIRECTED 3 TIMES A WEEK  . VICTOZA 18 MG/3ML SOPN INJECT 1.8 MG UNDER THE SKIN EVERY DAY  . zolpidem (AMBIEN CR) 12.5 MG CR tablet Take 1 tablet (12.5 mg total) by mouth at bedtime as needed for sleep.  . [DISCONTINUED] albuterol (PROVENTIL HFA;VENTOLIN HFA) 108 (90 Base) MCG/ACT inhaler Inhale 1 puff into the lungs every 6 (six) hours as needed for wheezing or shortness of breath.  . [DISCONTINUED] fentaNYL 62.5 MCG/HR PT72 Place 62.5 mcg onto the skin every 3 (three) days.  . [DISCONTINUED] pentoxifylline (TRENTAL) 400 MG CR tablet TAKE 1 TABLET BY MOUTH THREE TIMES DAILY FOR CLAUDICATION  . [DISCONTINUED] zolpidem (AMBIEN CR)  12.5 MG CR tablet Take 1 tablet (12.5 mg total) by mouth at bedtime as needed for sleep.  Marland Kitchen amoxicillin-clavulanate (AUGMENTIN) 875-125 MG tablet Take 1 tablet by mouth 2 (two) times daily. (Patient not taking: Reported on 10/25/2017)  . colchicine 0.6 MG tablet Take 1 tablet (0.6 mg total) by mouth daily. (Patient not taking: Reported on 11/27/2017)  . fentaNYL (DURAGESIC - DOSED MCG/HR) 50 MCG/HR Place 1 patch (50 mcg total) onto the skin every 3 (three) days.  . methylPREDNISolone (MEDROL) 4 MG TBPK tablet Take by mouth as directed for 6 days (Patient not taking: Reported on 02/12/2018)   No facility-administered encounter medications on file as of  03/08/2018.       Medical History: Past Medical History:  Diagnosis Date  . Anemia   . CAD (coronary artery disease)   . Chest pain   . Coronary artery disease   . Diabetes mellitus without complication ([place])   . Esophageal reflux   . Herpes zoster without mention of complication   . Hyperlipidemia   . Hypertension   . Insomnia   . Lumbago   . Neuropathy in diabetes ([place])   . Obstructive chronic bronchitis without exacerbation ([place])   . Other malaise and fatigue   . Stroke ([place])   . Varicose veins      Vital Signs: BP (!) 152/70   Pulse 64   Temp (!) 96.6 F (35.9 C)   Resp 16   Ht 5\' 6"  (1.676 m)   Wt (!) 318 lb 6.4 oz (144.4 kg)   SpO2 97%   BMI 51.39 kg/m    Review of Systems  Constitutional: Negative.  Negative for chills, fatigue and unexpected weight change.  HENT: Negative.  Negative for congestion, rhinorrhea, sneezing and sore throat.   Eyes: Negative for redness.  Respiratory: Negative.  Negative for cough, chest tightness and shortness of breath.   Cardiovascular: Negative.  Negative for chest pain and palpitations.  Gastrointestinal: Negative.  Negative for abdominal pain, constipation, diarrhea, nausea and vomiting.  Endocrine: Negative.   Genitourinary: Negative.  Negative for dysuria and frequency.  Musculoskeletal: Negative.  Negative for arthralgias, back pain, joint swelling and neck pain.  Skin: Negative.  Negative for rash.  Allergic/Immunologic: Negative.   Neurological: Negative.  Negative for tremors and numbness.  Hematological: Negative for adenopathy. Does not bruise/bleed easily.  Psychiatric/Behavioral: Negative.  Negative for behavioral problems, sleep disturbance and suicidal ideas. The patient is not nervous/anxious.     Physical Exam  Constitutional: He is oriented to person, place, and time. He appears well-nourished. No distress.  Obese  HENT:  Head: Normocephalic and atraumatic.  Mouth/Throat: Oropharynx is clear and  moist. No oropharyngeal exudate.  Eyes: Pupils are equal, round, and reactive to light. EOM are normal.  Neck: Normal range of motion. Neck supple. No JVD present. No tracheal deviation present. No thyromegaly present.  Cardiovascular: Normal rate, regular rhythm and normal heart sounds. Exam reveals no gallop and no friction rub.  No murmur heard. Pulmonary/Chest: Effort normal and breath sounds normal. No respiratory distress. He has no wheezes. He has no rales. He exhibits no tenderness.  Abdominal: Soft. There is no tenderness. There is no guarding.  Musculoskeletal: Normal range of motion. He exhibits edema.  Lymphadenopathy:    He has no cervical adenopathy.  Neurological: He is alert and oriented to person, place, and time. No cranial nerve deficit.  Skin: Skin is warm and dry. He is not diaphoretic.  Psychiatric: He has a  normal mood and affect. His behavior is normal. Judgment and thought content normal.  Nursing note and vitals reviewed.  Assessment/Plan: 1. Chronic pain disorder Decreased fentanyl patch back 50 mcg, and increased gabapentin back to three times daily. Pt reports problems seem to start after increasing Fentanyl patch.  - fentaNYL (DURAGESIC - DOSED MCG/HR) 50 MCG/HR; Place 1 patch (50 mcg total) onto the skin every 3 (three) days.  Dispense: 15 patch; Refill: 0  2. Diabetic polyneuropathy associated with type 2 diabetes mellitus ([facility]) Increase gabapentin back to 3 times daily.   3. Primary insomnia Refilled medications. - zolpidem (AMBIEN CR) 12.5 MG CR tablet; Take 1 tablet (12.5 mg total) by mouth at bedtime as needed for sleep.  Dispense: 30 tablet; Refill: 3  4. Flu vaccine need - Flu Vaccine MDCK QUAD PF  5. Essential hypertension Elevated today, most likely from pain.  Will follow at next visit this month.   General Counseling: Ajdin verbalizes understanding of the findings of todays visit and agrees with plan of treatment. I have discussed any further  diagnostic evaluation that may be needed or ordered today. We also reviewed his medications today. he has been encouraged to call the office with any questions or concerns that should arise related to todays visit.   Orders Placed This Encounter  Procedures  . Flu Vaccine MDCK QUAD PF    Meds ordered this encounter  Medications  . fentaNYL (DURAGESIC - DOSED MCG/HR) 50 MCG/HR    Sig: Place 1 patch (50 mcg total) onto the skin every 3 (three) days.    Dispense:  15 patch    Refill:  0  . zolpidem (AMBIEN CR) 12.5 MG CR tablet    Sig: Take 1 tablet (12.5 mg total) by mouth at bedtime as needed for sleep.    Dispense:  30 tablet    Refill:  3    Time spent: 25 Minutes  This patient was seen by Orson Gear AGNP-C in Collaboration with Dr [name] as a part of collaborative care agreement

## 2018-03-08 NOTE — Patient Instructions (Signed)
Neuropathic Pain Neuropathic pain is pain caused by damage to the nerves that are responsible for certain sensations in your body (sensory nerves). The pain can be caused by damage to:  The sensory nerves that send signals to your spinal cord and brain (peripheral nervous system).  The sensory nerves in your brain or spinal cord (central nervous system).  Neuropathic pain can make you more sensitive to pain. What would be a minor sensation for most people may feel very painful if you have neuropathic pain. This is usually a long-term condition that can be difficult to treat. The type of pain can differ from person to person. It may start suddenly (acute), or it may develop slowly and last for a long time (chronic). Neuropathic pain may come and go as damaged nerves heal or may stay at the same level for years. It often causes emotional distress, loss of sleep, and a lower quality of life. What are the causes? The most common cause of damage to a sensory nerve is diabetes. Many other diseases and conditions can also cause neuropathic pain. Causes of neuropathic pain can be classified as:  Toxic. Many drugs and chemicals can cause toxic damage. The most common cause of toxic neuropathic pain is damage from drug treatment for cancer (chemotherapy).  Metabolic. This type of pain can happen when a disease causes imbalances that damage nerves. Diabetes is the most common of these diseases. Vitamin B deficiency caused by long-term alcohol abuse is another common cause.  Traumatic. Any injury that cuts, crushes, or stretches a nerve can cause damage and pain. A common example is feeling pain after losing an arm or leg (phantom limb pain).  Compression-related. If a sensory nerve gets trapped or compressed for a long period of time, the blood supply to the nerve can be cut off.  Vascular. Many blood vessel diseases can cause neuropathic pain by decreasing blood supply and oxygen to nerves.  Autoimmune.  This type of pain results from diseases in which the body's defense system mistakenly attacks sensory nerves. Examples of autoimmune diseases that can cause neuropathic pain include lupus and multiple sclerosis.  Infectious. Many types of viral infections can damage sensory nerves and cause pain. Shingles infection is a common cause of this type of pain.  Inherited. Neuropathic pain can be a symptom of many diseases that are passed down through families (genetic).  What are the signs or symptoms? The main symptom is pain. Neuropathic pain is often described as:  Burning.  Shock-like.  Stinging.  Hot or cold.  Itching.  How is this diagnosed? No single test can diagnose neuropathic pain. Your health care provider will do a physical exam and ask you about your pain. You may use a pain scale to describe how bad your pain is. You may also have tests to see if you have a high sensitivity to pain and to help find the cause and location of any sensory nerve damage. These tests may include:  Imaging studies, such as: ? X-rays. ? CT scan. ? MRI.  Nerve conduction studies to test how well nerve signals travel through your sensory nerves (electrodiagnostic testing).  Stimulating your sensory nerves through electrodes on your skin and measuring the response in your spinal cord and brain (somatosensory evoked potentials).  How is this treated? Treatment for neuropathic pain may change over time. You may need to try different treatment options or a combination of treatments. Some options include:  Over-the-counter pain relievers.  Prescription medicines. Some medicines   used to treat other conditions may also help neuropathic pain. These include medicines to: ? Control seizures (anticonvulsants). ? Relieve depression (antidepressants).  Prescription-strength pain relievers (narcotics). These are usually used when other pain relievers do not help.  Transcutaneous nerve stimulation (TENS).  This uses electrical currents to block painful nerve signals. The treatment is painless.  Topical and local anesthetics. These are medicines that numb the nerves. They can be injected as a nerve block or applied to the skin.  Alternative treatments, such as: ? Acupuncture. ? Meditation. ? Massage. ? Physical therapy. ? Pain management programs. ? Counseling.  Follow these instructions at home:  Learn as much as you can about your condition.  Take medicines only as directed by your health care provider.  Work closely with all your health care providers to find what works best for you.  Have a good support system at home.  Consider joining a chronic pain support group. Contact a health care provider if:  Your pain treatments are not helping.  You are having side effects from your medicines.  You are struggling with fatigue, mood changes, depression, or anxiety. This information is not intended to replace advice given to you by your health care provider. Make sure you discuss any questions you have with your health care provider. Document Released: 02/17/2004 Document Revised: 12/10/2015 Document Reviewed: 10/30/2013 Elsevier Interactive Patient Education  2018 [employer]. Hypertension Hypertension is another name for high blood pressure. High blood pressure forces your heart to work harder to pump blood. This can cause problems over time. There are two numbers in a blood pressure reading. There is a top number (systolic) over a bottom number (diastolic). It is best to have a blood pressure below 120/80. Healthy choices can help lower your blood pressure. You may need medicine to help lower your blood pressure if:  Your blood pressure cannot be lowered with healthy choices.  Your blood pressure is higher than 130/80.  Follow these instructions at home: Eating and drinking  If directed, follow the DASH eating plan. This diet includes: ? Filling half of your plate at each  meal with fruits and vegetables. ? Filling one quarter of your plate at each meal with whole grains. Whole grains include whole wheat pasta, brown rice, and whole grain bread. ? Eating or drinking low-fat dairy products, such as skim milk or low-fat yogurt. ? Filling one quarter of your plate at each meal with low-fat (lean) proteins. Low-fat proteins include fish, skinless chicken, eggs, beans, and tofu. ? Avoiding fatty meat, cured and processed meat, or chicken with skin. ? Avoiding premade or processed food.  Eat less than 1,500 mg of salt (sodium) a day.  Limit alcohol use to no more than 1 drink a day for nonpregnant women and 2 drinks a day for men. One drink equals 12 oz of beer, 5 oz of wine, or 1 oz of hard liquor. Lifestyle  Work with your doctor to stay at a healthy weight or to lose weight. Ask your doctor what the best weight is for you.  Get at least 30 minutes of exercise that causes your heart to beat faster (aerobic exercise) most days of the week. This may include walking, swimming, or biking.  Get at least 30 minutes of exercise that strengthens your muscles (resistance exercise) at least 3 days a week. This may include lifting weights or pilates.  Do not use any products that contain nicotine or tobacco. This includes cigarettes and e-cigarettes. If you need  help quitting, ask your doctor.  Check your blood pressure at home as told by your doctor.  Keep all follow-up visits as told by your doctor. This is important. Medicines  Take over-the-counter and prescription medicines only as told by your doctor. Follow directions carefully.  Do not skip doses of blood pressure medicine. The medicine does not work as well if you skip doses. Skipping doses also puts you at risk for problems.  Ask your doctor about side effects or reactions to medicines that you should watch for. Contact a doctor if:  You think you are having a reaction to the medicine you are taking.  You  have headaches that keep coming back (recurring).  You feel dizzy.  You have swelling in your ankles.  You have trouble with your vision. Get help right away if:  You get a very bad headache.  You start to feel confused.  You feel weak or numb.  You feel faint.  You get very bad pain in your: ? Chest. ? Belly (abdomen).  You throw up (vomit) more than once.  You have trouble breathing. Summary  Hypertension is another name for high blood pressure.  Making healthy choices can help lower blood pressure. If your blood pressure cannot be controlled with healthy choices, you may need to take medicine. This information is not intended to replace advice given to you by your health care provider. Make sure you discuss any questions you have with your health care provider. Document Released: 11/08/2007 Document Revised: 04/19/2016 Document Reviewed: 04/19/2016 Elsevier Interactive Patient Education  [employer].

## 2018-03-10 ENCOUNTER — Other Ambulatory Visit: Payer: Self-pay | Admitting: Internal Medicine

## 2018-03-11 ENCOUNTER — Other Ambulatory Visit: Payer: Self-pay | Admitting: Internal Medicine

## 2018-03-15 ENCOUNTER — Other Ambulatory Visit: Payer: Self-pay

## 2018-03-15 MED ORDER — BASAGLAR KWIKPEN 100 UNIT/ML ~~LOC~~ SOPN: PEN_INJECTOR | SUBCUTANEOUS | 0 refills | Status: DC

## 2018-03-26 ENCOUNTER — Ambulatory Visit (INDEPENDENT_AMBULATORY_CARE_PROVIDER_SITE_OTHER): Payer: Medicare Other | Admitting: Adult Health

## 2018-03-26 ENCOUNTER — Encounter: Payer: Self-pay | Admitting: Adult Health

## 2018-03-26 VITALS — BP 149/80 | HR 81 | Resp 16 | Ht 66.0 in | Wt 324.8 lb

## 2018-03-26 DIAGNOSIS — G894 Chronic pain syndrome: Secondary | ICD-10-CM

## 2018-03-26 DIAGNOSIS — I10 Essential (primary) hypertension: Secondary | ICD-10-CM

## 2018-03-26 DIAGNOSIS — R0602 Shortness of breath: Secondary | ICD-10-CM

## 2018-03-26 DIAGNOSIS — G4733 Obstructive sleep apnea (adult) (pediatric): Secondary | ICD-10-CM | POA: Diagnosis not present

## 2018-03-26 DIAGNOSIS — J449 Chronic obstructive pulmonary disease, unspecified: Secondary | ICD-10-CM | POA: Diagnosis not present

## 2018-03-26 DIAGNOSIS — E1165 Type 2 diabetes mellitus with hyperglycemia: Secondary | ICD-10-CM

## 2018-03-26 MED ORDER — FENTANYL 50 MCG/HR TD PT72: 50.0000 ug | MEDICATED_PATCH | TRANSDERMAL | 0 refills | Status: DC

## 2018-03-26 MED ORDER — LIRAGLUTIDE 18 MG/3ML ~~LOC~~ SOPN: 1.8000 mg | PEN_INJECTOR | Freq: Every day | SUBCUTANEOUS | 0 refills | Status: DC

## 2018-03-26 MED ORDER — BASAGLAR KWIKPEN 100 UNIT/ML ~~LOC~~ SOPN: PEN_INJECTOR | SUBCUTANEOUS | 0 refills | Status: DC

## 2018-03-26 NOTE — Patient Instructions (Signed)

## 2018-03-26 NOTE — Progress Notes (Signed)
[facility] [place] [address]  Pulmonary Sleep Medicine   Office Visit Note  Patient Name: Adam Merritt DOB: 12-10-1945 MRN [id number]  Date of Service: 03/27/2018  Complaints/HPI: Patient here for follow-up on COPD.  He denies any overt shortness of breath that is out of the ordinary for him.  He still reports some dyspnea on exertion.  He has been using his inhalers and nebulizers as prescribed.  He denies any fever chills shortness of breath or palpitations.  He also denies any recent hospitalizations for his breathing.  ROS  General: (-) fever, (-) chills, (-) night sweats, (-) weakness Skin: (-) rashes, (-) itching,. Eyes: (-) visual changes, (-) redness, (-) itching. Nose and Sinuses: (-) nasal stuffiness or itchiness, (-) postnasal drip, (-) nosebleeds, (-) sinus trouble. Mouth and Throat: (-) sore throat, (-) hoarseness. Neck: (-) swollen glands, (-) enlarged thyroid, (-) neck pain. Respiratory: - cough, (-) bloody sputum, - shortness of breath, - wheezing. Cardiovascular: - ankle swelling, (-) chest pain. Lymphatic: (-) lymph node enlargement. Neurologic: (-) numbness, (-) tingling. Psychiatric: (-) anxiety, (-) depression   Current Medication: Outpatient Encounter Medications as of 03/26/2018  Medication Sig  . albuterol (PROVENTIL HFA;VENTOLIN HFA) 108 (90 Base) MCG/ACT inhaler INHALE 1 PUFF INTO THE LUNGS EVERY 6 HOURS AS NEEDED FOR WHEEZING OR SHORTNESS OF BREATH  . amoxicillin-clavulanate (AUGMENTIN) 875-125 MG tablet Take 1 tablet by mouth 2 (two) times daily.  Marland Kitchen aspirin 325 MG EC tablet Take 325 mg by mouth daily.  Marland Kitchen atorvastatin (LIPITOR) 40 MG tablet Take 40 mg by mouth at bedtime.   . carvedilol (COREG) 25 MG tablet Take 25 mg by mouth 2 (two) times daily with a meal.  . colchicine 0.6 MG tablet Take 1 tablet (0.6 mg total) by mouth daily.  Marland Kitchen docusate sodium (COLACE) 50 MG capsule Take 50 mg by mouth 2 (two) times daily.   . febuxostat (ULORIC) 40 MG tablet Take 1 tablet (40 mg total) by mouth daily.  . fentaNYL (DURAGESIC - DOSED MCG/HR) 50 MCG/HR Place 1 patch (50 mcg total) onto the skin every 3 (three) days.  . furosemide (LASIX) 20 MG tablet Take 20 mg by mouth.  . gabapentin (NEURONTIN) 300 MG capsule Take 1 capsule (300 mg total) by mouth 2 (two) times daily.  . hydrALAZINE (APRESOLINE) 25 MG tablet TAKE 1 TABLET BY MOUTH THREE TIMES DAILY FOR BLOOD PRESSURE  . ibuprofen (ADVIL,MOTRIN) 600 MG tablet Take 600 mg by mouth 2 (two) times daily as needed for mild pain or moderate pain.   . indapamide (LOZOL) 2.5 MG tablet Take 1 tablet (2.5 mg total) by mouth daily.  . Insulin Glargine (BASAGLAR KWIKPEN) 100 UNIT/ML SOPN INJECT 30 TO 36 UNITS UNDER THE SKIN EVERY DAY  . ipratropium-albuterol (DUONEB) 0.5-2.5 (3) MG/3ML SOLN Take 3 mLs by nebulization every 4 (four) hours as needed (for shortness of breath).  . liraglutide (VICTOZA) 18 MG/3ML SOPN Inject 0.3 mLs (1.8 mg total) into the skin daily.  . methylPREDNISolone (MEDROL) 4 MG TBPK tablet Take by mouth as directed for 6 days  . metolazone (ZAROXOLYN) 2.5 MG tablet TAKE 1 TABLET BY MOUTH DAILY  . metolazone (ZAROXOLYN) 2.5 MG tablet Take 1 tablet (2.5 mg total) by mouth daily.  . Multiple Vitamin (MULTIVITAMIN WITH MINERALS) TABS tablet Take 1 tablet by mouth daily.  Marland Kitchen omeprazole (PRILOSEC) 20 MG capsule Take 20 mg by mouth daily.  . Oxycodone HCl 10 MG TABS Half to one tab  as needed prn for pain (Patient taking differently: Half to one tab  prn for pain)  . pentoxifylline (TRENTAL) 400 MG CR tablet TAKE 1 TABLET BY MOUTH THREE TIMES DAILY FOR CLAUDICATION  . potassium chloride SA (K-DUR,KLOR-CON) 20 MEQ tablet Take 20 mEq by mouth 2 (two) times daily.  . sennosides-docusate sodium (SENOKOT-S) 8.6-50 MG tablet Take 1-2 tablets by mouth 2 (two) times daily.  Marland Kitchen UREA 20 INTENSIVE HYDRATING 20 % cream USE AS DIRECTED 3 TIMES A WEEK  . zolpidem (AMBIEN CR) 12.5  MG CR tablet Take 1 tablet (12.5 mg total) by mouth at bedtime as needed for sleep.  . [DISCONTINUED] fentaNYL (DURAGESIC - DOSED MCG/HR) 50 MCG/HR Place 1 patch (50 mcg total) onto the skin every 3 (three) days.  . [DISCONTINUED] Insulin Glargine (BASAGLAR KWIKPEN) 100 UNIT/ML SOPN INJECT 30 TO 36 UNITS UNDER THE SKIN EVERY DAY  . [DISCONTINUED] VICTOZA 18 MG/3ML SOPN INJECT 1.8 MG UNDER THE SKIN EVERY DAY   No facility-administered encounter medications on file as of 03/26/2018.     Surgical History: Past Surgical History:  Procedure Laterality Date  . CHOLECYSTECTOMY    . CORONARY ARTERY BYPASS GRAFT      Medical History: Past Medical History:  Diagnosis Date  . Anemia   . CAD (coronary artery disease)   . Chest pain   . Coronary artery disease   . Diabetes mellitus without complication ([place])   . Esophageal reflux   . Herpes zoster without mention of complication   . Hyperlipidemia   . Hypertension   . Insomnia   . Lumbago   . Neuropathy in diabetes ([place])   . Obstructive chronic bronchitis without exacerbation ([place])   . Other malaise and fatigue   . Stroke ([place])   . Varicose veins     Family History: Family History  Problem Relation Age of Onset  . Diabetes Mother   . Hyperlipidemia Mother   . Hypertension Mother   . Diabetes Father   . Hyperlipidemia Father   . Hypertension Father     Social History: Social History   Socioeconomic History  . Marital status: Married    Spouse name: Not on file  . Number of children: Not on file  . Years of education: Not on file  . Highest education level: Not on file  Occupational History  . Not on file  Social Needs  . Financial resource strain: Not on file  . Food insecurity:    Worry: Not on file    Inability: Not on file  . Transportation needs:    Medical: Not on file    Non-medical: Not on file  Tobacco Use  . Smoking status: Never Smoker  . Smokeless tobacco: Never Used  Substance and Sexual Activity  .  Alcohol use: No  . Drug use: No  . Sexual activity: Not on file  Lifestyle  . Physical activity:    Days per week: Not on file    Minutes per session: Not on file  . Stress: Not on file  Relationships  . Social connections:    Talks on phone: Not on file    Gets together: Not on file    Attends religious service: Not on file    Active member of club or organization: Not on file    Attends meetings of clubs or organizations: Not on file    Relationship status: Not on file  . Intimate partner violence:    Fear of current or ex partner:  Not on file    Emotionally abused: Not on file    Physically abused: Not on file    Forced sexual activity: Not on file  Other Topics Concern  . Not on file  Social History Narrative  . Not on file    Vital Signs: Blood pressure (!) 149/80, pulse 81, resp. rate 16, height 5\' 6"  (1.676 m), weight (!) 324 lb 12.8 oz (147.3 kg), SpO2 96 %.  Examination: General Appearance: The patient is well-developed, well-nourished, and in no distress. Skin: Gross inspection of skin unremarkable. Head: normocephalic, no gross deformities. Eyes: no gross deformities noted. ENT: ears appear grossly normal no exudates. Neck: Supple. No thyromegaly. No LAD. Respiratory: clear auscultation. Cardiovascular: Normal S1 and S2 without murmur or rub. Extremities: No cyanosis. pulses are equal. Neurologic: Alert and oriented. No involuntary movements.  LABS: No results found for this or any previous visit (from the past 2160 hour(s)).  Radiology: No results found.  No results found.  No results found.    Assessment and Plan: Patient Active Problem List   Diagnosis Date Noted  . Hypertension 01/28/2018  . Lymphedema of both lower extremities 01/28/2018  . Wheezing 10/25/2017  . Cellulitis and abscess of lower extremity 09/20/2017  . Edema of both lower legs due to peripheral venous insufficiency 09/20/2017  . Cellulitis 09/20/2017  . Atopic dermatitis  09/02/2017  . Chronic pain disorder 09/02/2017  . Acute gout of right knee 08/31/2017  . AKI (acute kidney injury) ([place]) 08/29/2017  . Other malaise and fatigue 07/09/2017  . CVA (cerebral vascular accident) ([place]) 09/15/2016  . 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 CVA (cerebral infarction) 02/12/2015  . Chronic diastolic CHF (congestive heart failure), NYHA class 3 ([place]) 11/30/2014  . Benign essential hypertension 11/06/2014  . CAD (coronary artery disease) of artery bypass graft 01/19/2014  . CKD (chronic kidney disease), stage III ([place]) 01/19/2014  . Acute bronchitis with COPD ([place]) 01/19/2014  . Diabetic neuropathy ([place]) 01/19/2014  . Insulin dependent diabetes mellitus ([place]) 01/19/2014  . OSA (obstructive sleep apnea) 01/19/2014  . Mixed hyperlipidemia 10/29/2013  . Peripheral vascular disease, unspecified ([place]) 09/29/2013   1. Chronic obstructive pulmonary disease, unspecified COPD type ([place]) Patient's breathing is generally doing well.  Continue using nebulizers and inhaler as directed.  2. OSA Patient has obstructive sleep apnea.  He does well using oxygen at 2 L at night.  He reports some nights he forgets to put it on.  He cannot tolerate CPAP mask.  We again discussed weight loss would improve his OSA.  3. Morbid obesity due to excess calories (HCC) Obesity Counseling: Risk Assessment: An assessment of behavioral risk factors was made today and includes lack of exercise sedentary lifestyle, lack of portion control and poor dietary habits.  Risk Modification Advice: She was counseled on portion control guidelines. Restricting daily caloric intake to. . The detrimental long term effects of obesity on her health and ongoing poor compliance was also discussed with the patient.  4. Essential hypertension Patient's blood pressure elevated today in office.  Patient reports he is in severe pain in his feet.  5. 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  He also has chronic back pain that causes him discomfort in his upper legs.  His back and leg pain is well controlled with Bentyl Duragesic patch.  The neuropathic pain is they are refractory to increased dose of fentanyl.  He was placed on gabapentin 300 mg twice daily in the  last year.  He reports mild results with this.  Offered a pain management referral, as I am uncomfortable continuing to add medications were increasing doses of his chronic pain medication.  He declined a pain management referral at this time and states that he will be to manage as he has. Fentanyl Duragesic patch is refilled for patient at this time, after discussing with Dr. Loyal Buba.  6. Shortness of breath And office prior shows FEV1/FVC is 101% of predicted value. - Spirometry with Graph  7. Uncontrolled type 2 diabetes mellitus with hyperglycemia (HCC) Medications refilled at patients request.  - Insulin Glargine (BASAGLAR KWIKPEN) 100 UNIT/ML SOPN; INJECT 30 TO 36 UNITS UNDER THE SKIN EVERY DAY  Dispense: 3 pen; Refill: 0 - liraglutide (VICTOZA) 18 MG/3ML SOPN; Inject 0.3 mLs (1.8 mg total) into the skin daily.  Dispense: 27 mL; Refill: 0   General Counseling: I have discussed the findings of the evaluation and examination with [place].  I have also discussed any further diagnostic evaluation thatmay be needed or ordered today. Chael verbalizes understanding of the findings of todays visit. We also reviewed his medications today and discussed drug interactions and side effects including but not limited excessive drowsiness and altered mental states. We also discussed that there is always a risk not just to him but also people around him. he has been encouraged to call the office with any questions or concerns that should arise related to todays visit.    Time spent: 30  I have personally obtained a history, examined the patient, evaluated laboratory and imaging results, formulated the assessment and plan and  placed orders.    Allyne Gee, MD [facility] Pulmonary and Critical Care Sleep medicine

## 2018-03-28 ENCOUNTER — Ambulatory Visit: Payer: Self-pay | Admitting: Nurse Practitioner

## 2018-04-01 ENCOUNTER — Ambulatory Visit (INDEPENDENT_AMBULATORY_CARE_PROVIDER_SITE_OTHER): Payer: Medicare Other | Admitting: Nurse Practitioner

## 2018-04-01 ENCOUNTER — Encounter: Payer: Self-pay | Admitting: Nurse Practitioner

## 2018-04-01 VITALS — BP 151/89 | HR 81 | Temp 97.7°F | Resp 16 | Ht 66.0 in | Wt 322.4 lb

## 2018-04-01 DIAGNOSIS — M67971 Unspecified disorder of synovium and tendon, right ankle and foot: Secondary | ICD-10-CM | POA: Diagnosis not present

## 2018-04-01 DIAGNOSIS — L03119 Cellulitis of unspecified part of limb: Secondary | ICD-10-CM | POA: Diagnosis not present

## 2018-04-01 DIAGNOSIS — I10 Essential (primary) hypertension: Secondary | ICD-10-CM | POA: Diagnosis not present

## 2018-04-01 DIAGNOSIS — I890 Lymphedema, not elsewhere classified: Secondary | ICD-10-CM

## 2018-04-01 DIAGNOSIS — L02419 Cutaneous abscess of limb, unspecified: Secondary | ICD-10-CM

## 2018-04-01 MED ORDER — DOXYCYCLINE HYCLATE 100 MG PO TABS: 100.0000 mg | ORAL_TABLET | Freq: Two times a day (BID) | ORAL | 0 refills | Status: DC

## 2018-04-01 NOTE — Progress Notes (Signed)
[facility] [place], [place] [address]  Internal MEDICINE  Office Visit Note  Patient Name: Adam Merritt  [id number]  [id number]  Date of Service: 04/02/2018  Chief Complaint  Patient presents with  . Pain    pt is having right ankle pain that is causing difficulty for pt to walk. pt stated he had a stumble last night and it felt like his skin was ripping apart, he stated that he is currently in a lot of pain. he did not take his blood presure medication before coming to the doctor this morning     The patient is c/o increased pain in both legs. Currently on fentanyl 92mcg patches, changed every 3 days. Initially working very well, but now, pain in legs and feet is starting to get worse. He states that pain is about 8/10 in severity, can be as low as 5 or 6/10 in severity with the fentanyl patch. Would do better if pain was at level of 3/10 in severity.  Of late, he has had more severe pain in both lower legs, especially the right ankle. He states that he twisted his ankle this morning and felt a burning, shooting pain from the heel, up through the calf. Feels as though he may have done something to achilles tendon.  Patient is also having increased redness, edema, and pain to the lower extremities. This is similar to episodes of cellulitis he has had in the past. Legs are warm to touch. Legs are leaking clear fluid. Hs seen wound care in the past when this has been issue.   Pt is here for a sick visit.     Current Medication:  Outpatient Encounter Medications as of 04/01/2018  Medication Sig  . albuterol (PROVENTIL HFA;VENTOLIN HFA) 108 (90 Base) MCG/ACT inhaler INHALE 1 PUFF INTO THE LUNGS EVERY 6 HOURS AS NEEDED FOR WHEEZING OR SHORTNESS OF BREATH  . aspirin 325 MG EC tablet Take 325 mg by mouth daily.  Marland Kitchen atorvastatin (LIPITOR) 40 MG tablet Take 40 mg by mouth at bedtime.   . carvedilol (COREG) 25 MG tablet Take 25 mg by mouth 2 (two) times daily with  a meal.  . colchicine 0.6 MG tablet Take 1 tablet (0.6 mg total) by mouth daily.  Marland Kitchen docusate sodium (COLACE) 50 MG capsule Take 50 mg by mouth 2 (two) times daily.  . febuxostat (ULORIC) 40 MG tablet Take 1 tablet (40 mg total) by mouth daily.  . fentaNYL (DURAGESIC - DOSED MCG/HR) 50 MCG/HR Place 1 patch (50 mcg total) onto the skin every 3 (three) days.  . furosemide (LASIX) 20 MG tablet Take 20 mg by mouth.  . gabapentin (NEURONTIN) 300 MG capsule Take 1 capsule (300 mg total) by mouth 2 (two) times daily.  . hydrALAZINE (APRESOLINE) 25 MG tablet TAKE 1 TABLET BY MOUTH THREE TIMES DAILY FOR BLOOD PRESSURE  . ibuprofen (ADVIL,MOTRIN) 600 MG tablet Take 600 mg by mouth 2 (two) times daily as needed for mild pain or moderate pain.   . indapamide (LOZOL) 2.5 MG tablet Take 1 tablet (2.5 mg total) by mouth daily.  . Insulin Glargine (BASAGLAR KWIKPEN) 100 UNIT/ML SOPN INJECT 30 TO 36 UNITS UNDER THE SKIN EVERY DAY  . ipratropium-albuterol (DUONEB) 0.5-2.5 (3) MG/3ML SOLN Take 3 mLs by nebulization every 4 (four) hours as needed (for shortness of breath).  . liraglutide (VICTOZA) 18 MG/3ML SOPN Inject 0.3 mLs (1.8 mg total) into the skin daily.  . metolazone (ZAROXOLYN) 2.5 MG  tablet TAKE 1 TABLET BY MOUTH DAILY  . metolazone (ZAROXOLYN) 2.5 MG tablet Take 1 tablet (2.5 mg total) by mouth daily.  . Multiple Vitamin (MULTIVITAMIN WITH MINERALS) TABS tablet Take 1 tablet by mouth daily.  Marland Kitchen omeprazole (PRILOSEC) 20 MG capsule Take 20 mg by mouth daily.  . Oxycodone HCl 10 MG TABS Half to one tab as needed prn for pain (Patient taking differently: Half to one tab  prn for pain)  . pentoxifylline (TRENTAL) 400 MG CR tablet TAKE 1 TABLET BY MOUTH THREE TIMES DAILY FOR CLAUDICATION  . potassium chloride SA (K-DUR,KLOR-CON) 20 MEQ tablet Take 20 mEq by mouth 2 (two) times daily.  . sennosides-docusate sodium (SENOKOT-S) 8.6-50 MG tablet Take 1-2 tablets by mouth 2 (two) times daily.  Marland Kitchen UREA 20 INTENSIVE  HYDRATING 20 % cream USE AS DIRECTED 3 TIMES A WEEK  . zolpidem (AMBIEN CR) 12.5 MG CR tablet Take 1 tablet (12.5 mg total) by mouth at bedtime as needed for sleep.  Marland Kitchen amoxicillin-clavulanate (AUGMENTIN) 875-125 MG tablet Take 1 tablet by mouth 2 (two) times daily. (Patient not taking: Reported on 04/01/2018)  . doxycycline (VIBRA-TABS) 100 MG tablet Take 1 tablet (100 mg total) by mouth 2 (two) times daily.  . methylPREDNISolone (MEDROL) 4 MG TBPK tablet Take by mouth as directed for 6 days (Patient not taking: Reported on 04/01/2018)   No facility-administered encounter medications on file as of 04/01/2018.       Medical History: Past Medical History:  Diagnosis Date  . Anemia   . CAD (coronary artery disease)   . Chest pain   . Coronary artery disease   . Diabetes mellitus without complication ([place])   . Esophageal reflux   . Herpes zoster without mention of complication   . Hyperlipidemia   . Hypertension   . Insomnia   . Lumbago   . Neuropathy in diabetes ([place])   . Obstructive chronic bronchitis without exacerbation ([address])   . Other malaise and fatigue   . Stroke ([place])   . Varicose veins      Vital Signs: BP (!) 151/89 (BP Location: Left Arm, Patient Position: Sitting, Cuff Size: Large)   Pulse 81   Temp 97.7 F (36.5 C)   Resp 16   Ht 5\' 6"  (1.676 m)   Wt (!) 322 lb 6.4 oz (146.2 kg)   SpO2 96%   BMI 52.04 kg/m    Review of Systems  Constitutional: Positive for activity change and fatigue.  HENT: Negative for congestion and postnasal drip.   Eyes: Negative.   Respiratory: Positive for shortness of breath. Negative for cough, chest tightness and wheezing.   Cardiovascular: Positive for leg swelling. Negative for chest pain and palpitations.       Elevated blood pressure today.   Gastrointestinal: Negative for constipation, diarrhea, nausea and vomiting.  Endocrine:       Blood sugars doing well   Genitourinary: Negative for flank pain, frequency and urgency.   Musculoskeletal: Positive for arthralgias, back pain and myalgias.  Skin: Positive for rash and wound.       Skin of both lower legs is very red and warm. Reports that wounds are starting to drain clear fluid .  Allergic/Immunologic: Positive for environmental allergies.  Neurological: Negative for dizziness, light-headedness and headaches.  Hematological: Negative for adenopathy.  Psychiatric/Behavioral: Positive for dysphoric mood. Negative for agitation. The patient is nervous/anxious.     Physical Exam  Constitutional: He is oriented to person, place, and time. He  appears well-developed and well-nourished. He appears ill. No distress.  HENT:  Head: Normocephalic and atraumatic.  Nose: Nose normal.  Mouth/Throat: Oropharynx is clear and moist. No oropharyngeal exudate.  Eyes: Pupils are equal, round, and reactive to light. Conjunctivae and EOM are normal.  Neck: Normal range of motion. Neck supple. No JVD present. No tracheal deviation present. No thyromegaly present.  Cardiovascular: Normal rate, regular rhythm and normal heart sounds. Exam reveals no gallop and no friction rub.  No murmur heard. Moderate swelling in bilateral lower legs. Both are very red and warm and very tender to palpate even lightly.   Pulmonary/Chest: Effort normal and breath sounds normal. No respiratory distress. He has no rales. He exhibits no tenderness.  Abdominal: Soft. Bowel sounds are normal. There is no tenderness.  Musculoskeletal: Normal range of motion.  Moderate to severe lower leg pain, bilaterally. Worse on the right side, especially after he "twisted it" this morning in the bathroom. Pain is severe from just above the heel and into the calf. Hurts to flex and dorsiflex the right foot. Strength is diminished. Having very difficult time putting any weight on the right foot.   Lymphadenopathy:    He has no cervical adenopathy.  Neurological: He is alert and oriented to person, place, and time. No  cranial nerve deficit.  Skin: Skin is warm and dry. He is not diaphoretic.  Psychiatric: He has a normal mood and affect. His behavior is normal. Judgment and thought content normal.  Nursing note and vitals reviewed.  Assessment/Plan: 1. Achilles tendon disorder, right Rest, ice, and elevate the right foot when possible. Will refer to othropedics for further evaluation and treatment.  - Ambulatory referral to Orthopedic Surgery  2. Cellulitis and abscess of lower extremity Start doxycycline 100mg  twice daily for 10 days. Use previously prescribed ointments to help with infection, externally.  - doxycycline (VIBRA-TABS) 100 MG tablet; Take 1 tablet (100 mg total) by mouth 2 (two) times daily.  Dispense: 20 tablet; Refill: 0  3. Lymphedema of both lower extremities New referral to wound care for further evaluation and treatment.  - Ambulatory referral to Wound Clinic  4. Essential hypertension Generally stable. Continue bp medication as prescribed.   General Counseling: Zeev verbalizes understanding of the findings of todays visit and agrees with plan of treatment. I have discussed any further diagnostic evaluation that may be needed or ordered today. We also reviewed his medications today. he has been encouraged to call the office with any questions or concerns that should arise related to todays visit.    Counseling:  Apply a compressive ACE bandage. Rest and elevate the affected painful area.  Apply cold compresses intermittently as needed.  As pain recedes, begin normal activities slowly as tolerated.  Call if symptoms persist.  This patient was seen by [name] FNP Collaboration with Dr [name] as a part of collaborative care agreement  Orders Placed This Encounter  Procedures  . Ambulatory referral to Orthopedic Surgery  . Ambulatory referral to Wound Clinic    Meds ordered this encounter  Medications  . doxycycline (VIBRA-TABS) 100 MG tablet    Sig: Take 1  tablet (100 mg total) by mouth 2 (two) times daily.    Dispense:  20 tablet    Refill:  0    Order Specific Question:   Supervising Provider    Answer:   [name] [[id number]]    Time spent: 25 Minutes

## 2018-04-02 DIAGNOSIS — M67971 Unspecified disorder of synovium and tendon, right ankle and foot: Secondary | ICD-10-CM | POA: Insufficient documentation

## 2018-04-09 ENCOUNTER — Encounter: Payer: Medicare Other | Attending: Physician Assistant | Admitting: Physician Assistant

## 2018-04-09 DIAGNOSIS — G8929 Other chronic pain: Secondary | ICD-10-CM | POA: Diagnosis not present

## 2018-04-09 DIAGNOSIS — L03116 Cellulitis of left lower limb: Secondary | ICD-10-CM | POA: Insufficient documentation

## 2018-04-09 DIAGNOSIS — L97929 Non-pressure chronic ulcer of unspecified part of left lower leg with unspecified severity: Secondary | ICD-10-CM | POA: Diagnosis not present

## 2018-04-09 DIAGNOSIS — E11622 Type 2 diabetes mellitus with other skin ulcer: Secondary | ICD-10-CM | POA: Insufficient documentation

## 2018-04-09 DIAGNOSIS — G4730 Sleep apnea, unspecified: Secondary | ICD-10-CM | POA: Insufficient documentation

## 2018-04-09 DIAGNOSIS — I110 Hypertensive heart disease with heart failure: Secondary | ICD-10-CM | POA: Diagnosis not present

## 2018-04-09 DIAGNOSIS — Z6841 Body Mass Index (BMI) 40.0 and over, adult: Secondary | ICD-10-CM | POA: Diagnosis not present

## 2018-04-09 DIAGNOSIS — I890 Lymphedema, not elsewhere classified: Secondary | ICD-10-CM | POA: Insufficient documentation

## 2018-04-09 DIAGNOSIS — L03115 Cellulitis of right lower limb: Secondary | ICD-10-CM | POA: Insufficient documentation

## 2018-04-09 DIAGNOSIS — I509 Heart failure, unspecified: Secondary | ICD-10-CM | POA: Diagnosis not present

## 2018-04-09 DIAGNOSIS — J449 Chronic obstructive pulmonary disease, unspecified: Secondary | ICD-10-CM | POA: Diagnosis not present

## 2018-04-09 DIAGNOSIS — I872 Venous insufficiency (chronic) (peripheral): Secondary | ICD-10-CM | POA: Insufficient documentation

## 2018-04-09 DIAGNOSIS — E1140 Type 2 diabetes mellitus with diabetic neuropathy, unspecified: Secondary | ICD-10-CM | POA: Insufficient documentation

## 2018-04-09 DIAGNOSIS — E669 Obesity, unspecified: Secondary | ICD-10-CM | POA: Diagnosis not present

## 2018-04-09 DIAGNOSIS — Z8673 Personal history of transient ischemic attack (TIA), and cerebral infarction without residual deficits: Secondary | ICD-10-CM | POA: Insufficient documentation

## 2018-04-09 DIAGNOSIS — I2510 Atherosclerotic heart disease of native coronary artery without angina pectoris: Secondary | ICD-10-CM | POA: Insufficient documentation

## 2018-04-13 NOTE — Progress Notes (Signed)
ALAZAR, CHERIAN ([id number]) Visit Report for 04/09/2018 Abuse/Suicide Risk Screen Details Patient Name: Adam Merritt, Adam Merritt Date of Service: 04/09/2018 8:45 AM Medical Record Number: [id number] Patient Account Number: [id number] Date of Birth/Sex: [birth year] (72 y.o. M) Treating RN: Cornell Barman Primary Care Vaiden Adames: [name] Other Clinician: Referring Shauni Henner: Referral, Self Treating Yasamin Karel/Extender: STONE III, [name]: 0 Abuse/Suicide Risk Screen Items Answer ABUSE/SUICIDE RISK SCREEN: Has anyone close to you tried to hurt or harm you recentlyo No Do you feel uncomfortable with anyone in your familyo No Has anyone forced you do things that you didnot want to doo No Do you have any thoughts of harming yourselfo No Patient displays signs or symptoms of abuse and/or neglect. No Electronic Signature(s) Signed: 04/11/2018 10:02:59 AM By: Gretta Cool, BSN, RN, CWS, Kim RN, BSN Entered By: Gretta Cool, BSN, RN, CWS, Kim on 04/09/2018 08:56:59 Adam Merritt ([id number]) -------------------------------------------------------------------------------- Activities of Daily Living Details Patient Name: Adam Merritt, Adam Merritt Date of Service: 04/09/2018 8:45 AM Medical Record Number: [id number] Patient Account Number: [id number] Date of Birth/Sex: Feb 04, 1946 (72 y.o. M) Treating RN: Cornell Barman Primary Care Samarie Pinder: [name] Other Clinician: Referring Denard Tuminello: Referral, Self Treating Nyelli Samara/Extender: Melburn Hake, [name]: 0 Activities of Daily Living Items Answer Activities of Daily Living (Please select one for each item) Drive Automobile Need Assistance Take Medications Need Assistance Use Telephone Need Assistance Care for Appearance Need Assistance Use Toilet Need Assistance Bath / Shower Need Assistance Dress Self Need Assistance Feed Self Need Assistance Walk Need Assistance Get In / Out Bed Need Assistance Housework Need Assistance Prepare Meals Need  Assistance Handle Money Need Assistance Shop for Self Need Assistance Electronic Signature(s) Signed: 04/11/2018 10:02:59 AM By: Gretta Cool, BSN, RN, CWS, Kim RN, BSN Entered By: Gretta Cool, BSN, RN, CWS, Kim on 04/09/2018 08:57:14 Adam Merritt ([id number]) -------------------------------------------------------------------------------- Education Assessment Details Patient Name: Adam Merritt Date of Service: 04/09/2018 8:45 AM Medical Record Number: [id number] Patient Account Number: [id number] Date of Birth/Sex: [birth year] (72 y.o. M) Treating RN: Cornell Barman Primary Care Kalem Rockwell: [name] Other Clinician: Referring Jael Waldorf: Referral, Self Treating Chance Karam/Extender: Melburn Hake, [name]: 0 Primary Learner Assessed: Patient Learning Preferences/Education Level/Primary Language Learning Preference: Explanation, Demonstration Highest Education Level: High School Preferred Language: English Cognitive Barrier Assessment/Beliefs Language Barrier: No Translator Needed: No Memory Deficit: No Emotional Barrier: No Cultural/Religious Beliefs Affecting Medical Care: No Physical Barrier Assessment Impaired Vision: No Impaired Hearing: No Decreased Hand dexterity: No Knowledge/Comprehension Assessment Knowledge Level: Low Comprehension Level: Low Ability to understand written Low instructions: Ability to understand verbal Low instructions: Motivation Assessment Anxiety Level: Calm Cooperation: Cooperative Education Importance: Acknowledges Need Interest in Health Problems: Asks Questions Perception: Coherent Willingness to Engage in Self- Low Management Activities: Readiness to Engage in Self- Low Management Activities: Electronic Signature(s) Signed: 04/11/2018 10:02:59 AM By: Gretta Cool, BSN, RN, CWS, Kim RN, BSN Entered By: Gretta Cool, BSN, RN, CWS, Kim on 04/09/2018 08:57:54 Adam Merritt, Adam Merritt  ([id number]) -------------------------------------------------------------------------------- Fall Risk Assessment Details Patient Name: Adam Merritt Date of Service: 04/09/2018 8:45 AM Medical Record Number: [id number] Patient Account Number: [id number] Date of Birth/Sex: [birth year] (72 y.o. M) Treating RN: Cornell Barman Primary Care Krithi Bray: [name] Other Clinician: Referring Keoni Risinger: Referral, Self Treating Dametria Tuzzolino/Extender: Melburn Hake, [name]: 0 Fall Risk Assessment Items Have you had 2 or more falls in the last 12 monthso 0 Yes Have you had any fall that resulted in injury in the last 12 monthso 0 Yes FALL RISK ASSESSMENT: History of  falling - immediate or within 3 months 25 Yes Secondary diagnosis 0 No Ambulatory aid None/bed rest/wheelchair/nurse 0 No Crutches/cane/walker 15 Yes Furniture 0 No IV Access/Saline Lock 0 No Gait/Training Normal/bed rest/immobile 0 No Weak 10 Yes Impaired 0 No Mental Status Oriented to own ability 0 No Electronic Signature(s) Signed: 04/11/2018 10:02:59 AM By: Gretta Cool, BSN, RN, CWS, Kim RN, BSN Entered By: Gretta Cool, BSN, RN, CWS, Kim on 04/09/2018 08:58:18 Adam Merritt ([id number]) -------------------------------------------------------------------------------- Foot Assessment Details Patient Name: Adam Merritt Date of Service: 04/09/2018 8:45 AM Medical Record Number: [id number] Patient Account Number: [id number] Date of Birth/Sex: 1945/07/09 (72 y.o. M) Treating RN: Cornell Barman Primary Care Nicoya Friel: [name] Other Clinician: Referring Bernece Gall: Referral, Self Treating Ardean Melroy/Extender: STONE III, [name]: 0 Foot Assessment Items Site Locations + = Sensation present, - = Sensation absent, C = Callus, U = Ulcer R = Redness, W = Warmth, M = Maceration, PU = Pre-ulcerative lesion F = Fissure, S = Swelling, D = Dryness Assessment Right: Left: Other Deformity: No No Prior Foot Ulcer: No No Prior  Amputation: No No Charcot Joint: No No Ambulatory Status: Ambulatory With Help Assistance Device: Cane Gait: Administrator, arts) Signed: 04/11/2018 10:02:59 AM By: Gretta Cool, BSN, RN, CWS, Kim RN, BSN Entered By: Gretta Cool, BSN, RN, CWS, Kim on 04/09/2018 08:59:34 Adam Merritt ([id number]) -------------------------------------------------------------------------------- Nutrition Risk Assessment Details Patient Name: Adam Merritt Date of Service: 04/09/2018 8:45 AM Medical Record Number: [id number] Patient Account Number: [id number] Date of Birth/Sex: 1945/08/25 (72 y.o. M) Treating RN: Cornell Barman Primary Care Naketa Daddario: [name] Other Clinician: Referring Arissa Fagin: Referral, Self Treating Torsha Lemus/Extender: STONE III, [name]: 0 Height (in): 66 Weight (lbs): 323 Body Mass Index (BMI): 52.1 Nutrition Risk Assessment Items NUTRITION RISK SCREEN: I have an illness or condition that made me change the kind and/or amount of 0 No food I eat I eat fewer than two meals per day 0 No I eat few fruits and vegetables, or milk products 0 No I have three or more drinks of beer, liquor or wine almost every day 0 No I have tooth or mouth problems that make it hard for me to eat 0 No I don't always have enough money to buy the food I need 0 No I eat alone most of the time 0 No I take three or more different prescribed or over-the-counter drugs a day 0 No Without wanting to, I have lost or gained 10 pounds in the last six months 0 No I am not always physically able to shop, cook and/or feed myself 0 No Nutrition Protocols Good Risk Protocol Provide education on Moderate Risk Protocol 0 nutrition Electronic Signature(s) Signed: 04/11/2018 10:02:59 AM By: Gretta Cool, BSN, RN, CWS, Kim RN, BSN Entered By: Gretta Cool, BSN, RN, CWS, Kim on 04/09/2018 08:58:32

## 2018-04-14 NOTE — Progress Notes (Signed)
Adam Merritt, Adam Merritt ([id number]) Visit Report for 04/09/2018 Allergy List Details Patient Name: Adam Merritt Date of Service: 04/09/2018 8:45 AM Medical Record Number: [id number] Patient Account Number: [id number] Date of Birth/Sex: [birth year] (72 y.o. M) Treating RN: Cornell Barman Primary Care Yareth Macdonnell: [name] Other Clinician: Referring Maayan Jenning: Referral, Self Treating Jaxten Brosh/Extender: STONE III, [name]: 0 Allergies Active Allergies Corticosteroids (Glucocorticoids) Allergy Notes Electronic Signature(s) Signed: 04/11/2018 10:02:59 AM By: Gretta Cool, BSN, RN, CWS, Kim RN, BSN Entered By: Gretta Cool, BSN, RN, CWS, Kim on 04/09/2018 08:53:47 Adam Merritt ([id number]) -------------------------------------------------------------------------------- Arrival Information Details Patient Name: Adam Merritt Date of Service: 04/09/2018 8:45 AM Medical Record Number: [id number] Patient Account Number: [id number] Date of Birth/Sex: 07-13-1945 (72 y.o. M) Treating RN: Cornell Barman Primary Care Mishawn Didion: [name] Other Clinician: Referring Juquan Reznick: Referral, Self Treating Dalessandro Baldyga/Extender: Melburn Hake, [name]: 0 Visit Information Patient Arrived: Cane Arrival Time: 08:43 Accompanied By: wife Transfer Assistance: None Patient Identification Verified: Yes Secondary Verification Process Yes Completed: Patient Has Alerts: Yes Patient Alerts: Patient on Blood Thinner Aspirin 325mg  Type II Diabetes ABI 5/19 L 1.1 R 0.9 TBI L 0.6 R 0.6 History Since Last Visit Added or deleted any medications: No Any new allergies or adverse reactions: No Had a fall or experienced change in activities of daily living that may affect risk of falls: No Signs or symptoms of abuse/neglect since last visito No Hospitalized since last visit: No Implantable device outside of the clinic excluding cellular tissue based products placed in the center since last visit: No Pain Present  Now: Yes Electronic Signature(s) Signed: 04/09/2018 5:21:58 PM By: Montey Hora Entered By: Montey Hora on 04/09/2018 09:37:51 Adam Merritt ([id number]) -------------------------------------------------------------------------------- Clinic Level of Care Assessment Details Patient Name: Adam Merritt Date of Service: 04/09/2018 8:45 AM Medical Record Number: [id number] Patient Account Number: [id number] Date of Birth/Sex: 01-19-1946 (72 y.o. M) Treating RN: Montey Hora Primary Care Huntleigh Doolen: [name] Other Clinician: Referring Geniene List: Referral, Self Treating Jaymison Luber/Extender: STONE III, [name]: 0 Clinic Level of Care Assessment Items TOOL 1 Quantity Score []  - Use when EandM and Procedure is performed on INITIAL visit 0 ASSESSMENTS - Nursing Assessment / Reassessment X - General Physical Exam (combine w/ comprehensive assessment (listed just below) when 1 20 performed on new pt. evals) X- 1 25 Comprehensive Assessment (HX, ROS, Risk Assessments, Wounds Hx, etc.) ASSESSMENTS - Wound and Skin Assessment / Reassessment X - Dermatologic / Skin Assessment (not related to wound area) 1 10 ASSESSMENTS - Ostomy and/or Continence Assessment and Care []  - Incontinence Assessment and Management 0 []  - 0 Ostomy Care Assessment and Management (repouching, etc.) PROCESS - Coordination of Care X - Simple Patient / Family Education for ongoing care 1 15 []  - 0 Complex (extensive) Patient / Family Education for ongoing care X- 1 10 Staff obtains Programmer, systems, Records, Test Results / Process Orders []  - 0 Staff telephones HHA, Nursing Homes / Clarify orders / etc []  - 0 Routine Transfer to another Facility (non-emergent condition) []  - 0 Routine Hospital Admission (non-emergent condition) X- 1 15 New Admissions / Biomedical engineer / Ordering NPWT, Apligraf, etc. []  - 0 Emergency Hospital Admission (emergent condition) PROCESS - Special Needs []  -  Pediatric / Minor Patient Management 0 []  - 0 Isolation Patient Management []  - 0 Hearing / Language / Visual special needs []  - 0 Assessment of Community assistance (transportation, D/C planning, etc.) []  - 0 Additional assistance / Altered mentation []  - 0  Support Surface(s) Assessment (bed, cushion, seat, etc.) SHIELDS, PAUTZ ([id number]) INTERVENTIONS - Miscellaneous []  - External ear exam 0 []  - 0 Patient Transfer (multiple staff / Harrel Lemon Lift / Similar devices) []  - 0 Simple Staple / Suture removal (25 or less) []  - 0 Complex Staple / Suture removal (26 or more) []  - 0 Hypo/Hyperglycemic Management (do not check if billed separately) []  - 0 Ankle / Brachial Index (ABI) - do not check if billed separately Has the patient been seen at the hospital within the last three years: Yes Total Score: 95 Level Of Care: ____ Electronic Signature(s) Signed: 04/09/2018 5:21:58 PM By: Montey Hora Entered By: Montey Hora on 04/09/2018 10:08:02 Adam Merritt ([id number]) -------------------------------------------------------------------------------- Compression Therapy Details Patient Name: Adam Merritt Date of Service: 04/09/2018 8:45 AM Medical Record Number: [id number] Patient Account Number: [id number] Date of Birth/Sex: October 01, 1945 (72 y.o. M) Treating RN: Montey Hora Primary Care Xavious Sharrar: [name] Other Clinician: Referring Kaleab Frasier: Referral, Self Treating Gautham Hewins/Extender: STONE III, [name]: 0 Compression Therapy Performed for Wound Assessment: NonWound Condition Lymphedema - Bilateral Leg Performed By: Clinician Montey Hora, RN Compression Type: Three Layer Pre Treatment ABI: 1.1 Post Procedure Diagnosis Same as Pre-procedure Electronic Signature(s) Signed: 04/09/2018 10:07:33 AM By: Montey Hora Entered By: Montey Hora on 04/09/2018 10:07:33 Adam Merritt  ([id number]) -------------------------------------------------------------------------------- Encounter Discharge Information Details Patient Name: Adam Merritt Date of Service: 04/09/2018 8:45 AM Medical Record Number: [id number] Patient Account Number: [id number] Date of Birth/Sex: 1945/10/15 (72 y.o. M) Treating RN: Montey Hora Primary Care Louay Myrie: [name] Other Clinician: Referring Faithanne Verret: Referral, Self Treating Zayah Keilman/Extender: Melburn Hake, [name]: 0 Encounter Discharge Information Items Discharge Condition: Stable Ambulatory Status: Cane Discharge Destination: Home Transportation: Private Auto Accompanied By: spouse Schedule Follow-up Appointment: Yes Clinical Summary of Care: Electronic Signature(s) Signed: 04/09/2018 10:09:49 AM By: Montey Hora Entered By: Montey Hora on 04/09/2018 10:09:49 Adam Merritt ([id number]) -------------------------------------------------------------------------------- Lower Extremity Assessment Details Patient Name: Adam Merritt Date of Service: 04/09/2018 8:45 AM Medical Record Number: [id number] Patient Account Number: [id number] Date of Birth/Sex: [birth year] (72 y.o. M) Treating RN: Cornell Barman Primary Care Messina Kosinski: [name] Other Clinician: Referring Megan Hayduk: Referral, Self Treating Marria Mathison/Extender: STONE III, [name]: 0 Edema Assessment Assessed: [Left: No] [Right: No] [Left: Edema] [Right: :] Calf Left: Right: Point of Measurement: 32 cm From Medial Instep 65.3 cm 59 cm Ankle Left: Right: Point of Measurement: 11 cm From Medial Instep 41 cm 34.5 cm Electronic Signature(s) Signed: 04/11/2018 10:02:59 AM By: Gretta Cool, BSN, RN, CWS, Kim RN, BSN Entered By: Gretta Cool, BSN, RN, CWS, Kim on 04/09/2018 08:51:48 Adam Merritt ([id number]) -------------------------------------------------------------------------------- Multi Wound Chart Details Patient Name: Adam Merritt Date of  Service: 04/09/2018 8:45 AM Medical Record Number: [id number] Patient Account Number: [id number] Date of Birth/Sex: [birth year] (72 y.o. M) Treating RN: Montey Hora Primary Care Gershom Brobeck: [name] Other Clinician: Referring Lainee Lehrman: Referral, Self Treating Geovana Gebel/Extender: STONE III, [name]: 0 Vital Signs Height(in): 66 Pulse(bpm): 72 Weight(lbs): 323 Blood Pressure(mmHg): 131/60 Body Mass Index(BMI): 52 Temperature(F): 97.8 Respiratory Rate 18 (breaths/min): Wound Assessments Treatment Notes Electronic Signature(s) Signed: 04/09/2018 5:21:58 PM By: Montey Hora Entered By: Montey Hora on 04/09/2018 09:35:55 Adam Merritt ([id number]) -------------------------------------------------------------------------------- [place] Details Patient Name: Adam Merritt Date of Service: 04/09/2018 8:45 AM Medical Record Number: [id number] Patient Account Number: [id number] Date of Birth/Sex: 31-Jan-1946 (72 y.o. M) Treating RN: Montey Hora Primary Care Rai Sinagra: [name] Other Clinician: Referring Vaidehi Braddy:  Referral, Self Treating Taryn Nave/Extender: STONE III, [name]: 0 Active Inactive ` Abuse / Safety / Falls / Self Care Management Nursing Diagnoses: Potential for falls Goals: Patient will remain injury free related to falls Date Initiated: 04/09/2018 Target Resolution Date: 07/13/2018 Goal Status: Active Interventions: Assess fall risk on admission and as needed Notes: ` Orientation to the Wound Care Program Nursing Diagnoses: Knowledge deficit related to the wound healing center program Goals: Patient/caregiver will verbalize understanding of the [place] Date Initiated: 04/09/2018 Target Resolution Date: 07/13/2018 Goal Status: Active Interventions: Provide education on orientation to the wound center Notes: ` Venous Leg Ulcer Nursing Diagnoses: Actual venous Insuffiency (use  after diagnosis is confirmed) Goals: Patient will maintain optimal edema control Date Initiated: 04/09/2018 Target Resolution Date: 07/13/2018 Goal Status: Active Interventions: JOBIE, POPP ([id number]) Assess peripheral edema status every visit. Compression as ordered Provide education on venous insufficiency Notes: Electronic Signature(s) Signed: 04/09/2018 5:21:58 PM By: Montey Hora Entered By: Montey Hora on 04/09/2018 09:35:40 Adam Merritt ([id number]) -------------------------------------------------------------------------------- Non-Wound Condition Assessment Details Patient Name: Adam Merritt Date of Service: 04/09/2018 8:45 AM Medical Record Number: [id number] Patient Account Number: [id number] Date of Birth/Sex: 21-Aug-1945 (72 y.o. M) Treating RN: Cornell Barman Primary Care Clydette Privitera: [name] Other Clinician: Referring Jazlynn Nemetz: Referral, Self Treating Harlynn Kimbell/Extender: STONE III, [name]: 0 Non-Wound Condition: Condition: Lymphedema Location: Leg Side: Bilateral Periwound Skin Texture Texture Color No Abnormalities Noted: No No Abnormalities Noted: No Callus: No Atrophie Blanche: No Crepitus: No Cyanosis: No Excoriation: No Ecchymosis: No Friable: No Erythema: No Induration: Yes Hemosiderin Staining: Yes Rash: No Mottled: No Scarring: No Pallor: No Rubor: No Moisture No Abnormalities Noted: No Dry / Scaly: No Maceration: No Notes Patient's legs are swollen and leaking fluid bilateraly Electronic Signature(s) Signed: 04/11/2018 10:02:59 AM By: Gretta Cool, BSN, RN, CWS, Kim RN, BSN Entered By: Gretta Cool, BSN, RN, CWS, Kim on 04/09/2018 08:53:29 Adam Merritt ([id number]) -------------------------------------------------------------------------------- Pain Assessment Details Patient Name: Adam Merritt Date of Service: 04/09/2018 8:45 AM Medical Record Number: [id number] Patient Account Number: [id number] Date of Birth/Sex:  10-05-1945 (72 y.o. M) Treating RN: Cornell Barman Primary Care Kameren Baade: [name] Other Clinician: Referring Latoshia Monrroy: Referral, Self Treating Nailani Full/Extender: STONE III, [name]: 0 Active Problems Location of Pain Severity and Description of Pain Patient Has Paino No Site Locations Rate the pain. Current Pain Level: 8 Character of Pain Describe the Pain: Sharp Pain Management and Medication Current Pain Management: Electronic Signature(s) Signed: 04/11/2018 10:02:59 AM By: Gretta Cool, BSN, RN, CWS, Kim RN, BSN Entered By: Gretta Cool, BSN, RN, CWS, Kim on 04/09/2018 08:45:54 Adam Merritt ([id number]) -------------------------------------------------------------------------------- Patient/Caregiver Education Details Patient Name: Adam Merritt Date of Service: 04/09/2018 8:45 AM Medical Record Number: [id number] Patient Account Number: [id number] Date of Birth/Gender: November 25, 1945 (72 y.o. M) Treating RN: Montey Hora Primary Care Physician: [name] Other Clinician: Referring Physician: Referral, Self Treating Physician/Extender: Sharalyn Ink in Treatment: 0 Education Assessment Education Provided To: Patient and Caregiver Education Topics Provided Venous: Handouts: Other: need for ongoing compression and using lymph pumps Methods: Explain/Verbal Responses: State content correctly Electronic Signature(s) Signed: 04/09/2018 5:21:58 PM By: Montey Hora Entered By: Montey Hora on 04/09/2018 10:08:52 Adam Merritt ([id number]) -------------------------------------------------------------------------------- [place] Details Patient Name: Adam Merritt Date of Service: 04/09/2018 8:45 AM Medical Record Number: [id number] Patient Account Number: [id number] Date of Birth/Sex: 1945-06-17 (72 y.o. M) Treating RN: Cornell Barman Primary Care Ronnie Mallette: [name] Other Clinician: Referring Timohty Renbarger: Referral, Self  Treating Lakesha Levinson/Extender: STONE III,  [name]: 0 Vital Signs Time Taken: 08:45 Temperature (F): 97.8 Height (in): 66 Pulse (bpm): 72 Weight (lbs): 323 Respiratory Rate (breaths/min): 18 Body Mass Index (BMI): 52.1 Blood Pressure (mmHg): 131/60 Reference Range: 80 - 120 mg / dl Electronic Signature(s) Signed: 04/11/2018 10:02:59 AM By: Gretta Cool, BSN, RN, CWS, Kim RN, BSN Entered By: Gretta Cool, BSN, RN, CWS, Kim on 04/09/2018 08:46:40

## 2018-04-14 NOTE — Progress Notes (Signed)
[name], [name] ([id number]) Visit Report for 04/09/2018 Chief Complaint Document Details Patient Name: Adam Merritt, Adam Merritt Date of Service: 04/09/2018 8:45 AM Medical Record Number: [id number] Patient Account Number: [id number] Date of Birth/Sex: 1946/03/31 (72 y.o. M) Treating RN: Montey Hora Primary Care Provider: [name] Other Clinician: Referring Provider: Referral, Self Treating Provider/Extender: Melburn Hake, [name]: 0 Information Obtained from: Patient Chief Complaint he is here for evaluation of bilateral lower extremity lymphedema Electronic Signature(s) Signed: 04/12/2018 1:32:49 PM By: Worthy Keeler PA-C Entered By: Worthy Keeler on 04/09/2018 09:03:39 [name] ([id number]) -------------------------------------------------------------------------------- HPI Details Patient Name: [name] Date of Service: 04/09/2018 8:45 AM Medical Record Number: [id number] Patient Account Number: [id number] Date of Birth/Sex: 1945-11-25 (72 y.o. M) Treating RN: Montey Hora Primary Care Provider: [name] Other Clinician: Referring Provider: Referral, Self Treating Provider/Extender: STONE III, [name]: 0 History of Present Illness Location: bilateral lower extremity Quality: no pain to ulcerations Duration: ulcerations have been present 3-4 weeks Context: ulcerations of secondary to edema, blister formation HPI Description: 10/18/17-He is here for initial evaluation of bilateral lower extremity ulcerations in the presence of venous insufficiency and lymphedema. He has been seen by vascular medicine in the past, Dr. Lucky Cowboy, last seen in 2016. He does have a history of abnormal ABIs, which is to be expected given his lymphedema and venous insufficiency. According to Epic, it appears that all attempts for arterial evaluation and/or angiography were not follow through with by patient. He does have a history of being seen in lymphedema clinic  in 2018, stopped going approximately 6 months ago stating "it didn't do any good". He does not have lymphedema pumps, he does not have custom fit compression wrap/stockings. He is diabetic and his recent A1c last month was 7.6. He admits to chronic bilateral lower extremity pain, no change in pain since blister and ulceration development. He is currently being treated with Levaquin for bronchitis. He has home health and we will continue. 10/25/17-He is here in follow-up evaluation for bilateral lower extremity ulcerationssubtle he remains on Levaquin for bronchitis. Right lower extremity with no evidence of drainage or ulceration, persistent left lower extremity ulceration. He states that home health has not been out since his appointment. He went to [facility] on Tuesday, studies revealed: RIGHT ABI 0.9, TBI 0.6 LEFT ABI 1.1, TBI 0.6 with triphasic flow bilaterally. We will continue his same treatment plan. He has been educated on compression therapy and need for elevation. He will benefit from lymphedema pumps 11/01/17-He is here in follow-up evaluation for left lower extremity ulcer. The right lower extremity remains healed. He has home health services, but they have not been out to see the patient for 2-3 weeks. He states it home health physical therapy changed his dressing yesterday after therapy; he placed Ace wrap compression. We are still waiting for lymphedema pumps, reordered d/t need for company change. 11/08/17-He is here in follow-up evaluation for left lower extremity ulcer. It is improved. Edema is significantly improved with compression therapy. We will continue with same treatment plan and he will follow-up next week. No word regarding lymphedema pumps 11/15/17-He is here in follow-up evaluation for left lower extremity ulcer. He is healed and will be discharged from wound care services. I have reached out to [facility] regarding his lymphedema pumps. They have  been unable to reach the patient; the contact number they had with the patient's wife's cell phone and she has not answered any  unrecognized calls. Contact should be made today, trial planned for next week; [facility] will continue to follow 11/27/17 on evaluation today patient has multiple blistered areas over the right lower extremity his left lower extremity appears to be doing okay. These blistered areas show signs of no infection which is great news. With that being said he did have some necrotic skin overlying which was mechanically debrided away with saline and gauze today without complication. Overall post debridement the wounds appear to be doing better but in general his swelling seems to be increased. This is obviously not good news. I think this is what has given rise to the blisters. 12/04/17 on evaluation today patient presents for follow-up concerning his bilateral lower extremity edema in the right lower extremity ulcers. He has been tolerating the dressing changes without complication. With that being said he has had no real issues with the wraps which is also good news. Overall I'm pleased with the progress he's been making. 12/11/17 on evaluation today patient appears to be doing rather well in regard to his right lateral lower extremity ulcer. He's been tolerating the dressing changes without complication. Fortunately there does not appear to be any evidence of infection at this time. Overall I'm pleased with the progress that is being made. Unfortunately he has been in the hospital due to having what sounds to be a stomach virus/flu fortunately that is starting to get better. 12/18/17 on evaluation today patient actually appears to be doing very well in regard to his bilateral lower extremities the swelling is under fairly good control his lymphedema pumps are still not up and running quite yet. With that being said he does have several areas of opening noted as far as wounds  are concerned mainly over the left lower extremity. With that ARYAN, SPARKS ([id number]) being said I do believe once he gets lymphedema pumps this would least help you mention some the fluid and preventing this from occurring. Hopefully that will be set up soon sleeves are Artie in place at his home he just waiting for the machine. 12/25/17 on evaluation today patient actually appears to be doing excellent in fact all of his ulcers appear to have resolved his legs appear very well. I do think he needs compression stockings we have discussed this and they are actually going to go to [place] today to elastic therapy to get this fitted for him. I think that is definitely a good thing to do. Readmission: 04/09/18 upon evaluation today this patient is seen for readmission due to bilateral lower extremity lymphedema. He has significant swelling of his extremities especially on the left although the right is also swollen he has weeping from both sides. There are no obvious open wounds at this point. Fortunately he has been doing fairly well for quite a bit of time since I last saw him. Nonetheless unfortunately this seems to have reopened and is giving quite a bit of trouble. He states this began about a week ago when he first called [place] to get in to be seen. No fevers, chills, nausea, or vomiting noted at this time. He has not been using his lymphedema pumps due to the fact that they won't fit on his leg at this point likewise is also not been using his compression for essentially the same reason. Electronic Signature(s) Signed: 04/12/2018 1:32:49 PM By: Worthy Keeler PA-C Entered By: Worthy Keeler on 04/12/2018 13:18:40 [name] ([id number]) -------------------------------------------------------------------------------- Physical Exam Details Patient Name: ASHE, GAGO  W. Date of Service: 04/09/2018 8:45 AM Medical Record Number: [id number] Patient Account Number: [id number] Date of  Birth/Sex: January 06, 1946 (72 y.o. M) Treating RN: Montey Hora Primary Care Provider: [name] Other Clinician: Referring Provider: Referral, Self Treating Provider/Extender: STONE III, [name]: 0 Constitutional sitting or standing blood pressure is within target range for patient.. pulse regular and within target range for patient.Marland Kitchen respirations regular, non-labored and within target range for patient.Marland Kitchen temperature within target range for patient.. Obese and well-hydrated in no acute distress. Eyes conjunctiva clear no eyelid edema noted. pupils equal round and reactive to light and accommodation. Ears, Nose, Mouth, and Throat no gross abnormality of ear auricles or external auditory canals. normal hearing noted during conversation. mucus membranes moist. Respiratory normal breathing without difficulty. clear to auscultation bilaterally. Cardiovascular regular rate and rhythm with normal S1, S2. no clubbing, cyanosis, significant edema, <3 sec cap refill. Gastrointestinal (GI) soft, non-tender, non-distended, +BS. no ventral hernia noted. Musculoskeletal Patient unable to walk without assistance. Psychiatric this patient is able to make decisions and demonstrates good insight into disease process. Alert and Oriented x 3. pleasant and cooperative. Notes Patient appears to have no evidence of open wounds at this time although he does have bilateral lower extremity stage III lymphedema with fibrosis changes in regard to his skin. In general I feel like he has worsened quite a been since I last saw him. He is about 15 cm larger in regard to his left calf region compared to last evaluation where we discharged him. Obviously this is indicative of significant swelling fortunately he tells me he has no issues with breathing at this time. Electronic Signature(s) Signed: 04/12/2018 1:32:49 PM By: Worthy Keeler PA-C Entered By: Worthy Keeler on 04/12/2018 13:19:30 [name] ([id number]) -------------------------------------------------------------------------------- Physician Orders Details Patient Name: [name] Date of Service: 04/09/2018 8:45 AM Medical Record Number: [id number] Patient Account Number: [id number] Date of Birth/Sex: [birth year] (72 y.o. M) Treating RN: Montey Hora Primary Care Provider: [name] Other Clinician: Referring Provider: Referral, Self Treating Provider/Extender: Melburn Hake, [name]: 0 Verbal / Phone Orders: No Diagnosis Coding ICD-10 Coding Code Description L03.115 Cellulitis of right lower limb L03.116 Cellulitis of left lower limb I89.0 Lymphedema, not elsewhere classified E66.9 Obesity, unspecified Wound Cleansing o May Shower, gently pat wound dry prior to applying new dressing. - Please do not get your wraps wet but you can remove the wraps prior to nurse visits and wound clinic visits and shower and wash your legs Skin Barriers/Peri-Wound Care o Moisturizing lotion Secondary Dressing o XtraSorb - on any draining lymphedema Dressing Change Frequency o Other: - twice weekly - on Tuesdays and Fridays. Patient comes to [place] on Tuesdays, [facility] to change wraps on Fridays Follow-up Appointments o Return Appointment in 1 week. Edema Control o 3 Layer Compression System - Bilateral - may anchor with unna if needed [place] for [place] Nurse may visit PRN to address patientos wound care needs. o FACE TO FACE ENCOUNTER: MEDICARE and MEDICAID PATIENTS: I certify that this patient is under my care and that I had a face-to-face encounter that meets the physician face-to-face encounter requirements with this patient on this date. The encounter with the patient was in whole or in part for the following MEDICAL CONDITION: (primary reason for [place]) MEDICAL NECESSITY: I certify, that based on my  findings, NURSING services are a medically necessary home health  service. HOME BOUND STATUS: I certify that my clinical findings support that this patient is homebound (i.e., Due to illness or injury, pt requires aid of supportive devices such as crutches, cane, wheelchairs, walkers, the use of special transportation or the assistance of another person to leave their place of residence. There is a normal inability to leave the home and doing so requires considerable and taxing effort. Other absences are for medical reasons / religious services and are infrequent or of short duration when for other reasons). o If current dressing causes regression in wound condition, may D/C ordered dressing product/s and apply Normal Saline Moist Dressing daily until next [place] / Other MD appointment. [place] of regression in wound condition at [phone]. o Please direct any NON-WOUND related issues/requests for orders to patient's Primary Care Physician Electronic Signature(s) MAHAD, NEWSTROM ([id number]) Signed: 04/09/2018 5:21:58 PM By: Montey Hora Signed: 04/12/2018 1:32:49 PM By: Worthy Keeler PA-C Entered By: Montey Hora on 04/09/2018 09:43:20 [name] ([id number]) -------------------------------------------------------------------------------- Problem List Details Patient Name: [name] Date of Service: 04/09/2018 8:45 AM Medical Record Number: [id number] Patient Account Number: [id number] Date of Birth/Sex: May 01, 1946 (72 y.o. M) Treating RN: Montey Hora Primary Care Provider: [name] Other Clinician: Referring Provider: Referral, Self Treating Provider/Extender: Melburn Hake, [name]: 0 Active Problems ICD-10 Evaluated Encounter Code Description Active Date Today Diagnosis L03.115 Cellulitis of right lower limb 04/09/2018 No Yes L03.116 Cellulitis of left lower limb 04/09/2018 No Yes I89.0 Lymphedema, not elsewhere  classified 04/09/2018 No Yes E66.9 Obesity, unspecified 04/09/2018 No Yes Inactive Problems Resolved Problems Electronic Signature(s) Signed: 04/12/2018 1:32:49 PM By: Worthy Keeler PA-C Entered By: Worthy Keeler on 04/09/2018 09:03:05 [name] ([id number]) -------------------------------------------------------------------------------- Progress Note Details Patient Name: [name] Date of Service: 04/09/2018 8:45 AM Medical Record Number: [id number] Patient Account Number: [id number] Date of Birth/Sex: March 17, 1946 (72 y.o. M) Treating RN: Montey Hora Primary Care Provider: [name] Other Clinician: Referring Provider: Referral, Self Treating Provider/Extender: Melburn Hake, [name]: 0 Subjective Chief Complaint Information obtained from Patient he is here for evaluation of bilateral lower extremity lymphedema History of Present Illness (HPI) The following HPI elements were documented for the patient's wound: Location: bilateral lower extremity Quality: no pain to ulcerations Duration: ulcerations have been present 3-4 weeks Context: ulcerations of secondary to edema, blister formation 10/18/17-He is here for initial evaluation of bilateral lower extremity ulcerations in the presence of venous insufficiency and lymphedema. He has been seen by vascular medicine in the past, Dr. Lucky Cowboy, last seen in 2016. He does have a history of abnormal ABIs, which is to be expected given his lymphedema and venous insufficiency. According to Epic, it appears that all attempts for arterial evaluation and/or angiography were not follow through with by patient. He does have a history of being seen in lymphedema clinic in 2018, stopped going approximately 6 months ago stating "it didn't do any good". He does not have lymphedema pumps, he does not have custom fit compression wrap/stockings. He is diabetic and his recent A1c last month was 7.6. He admits to chronic bilateral  lower extremity pain, no change in pain since blister and ulceration development. He is currently being treated with Levaquin for bronchitis. He has home health and we will continue. 10/25/17-He is here in follow-up evaluation for bilateral lower extremity ulcerationssubtle he remains on Levaquin for bronchitis. Right lower extremity with no evidence of drainage or ulceration, persistent left lower  extremity ulceration. He states that home health has not been out since his appointment. He went to [facility] on Tuesday, studies revealed: RIGHT ABI 0.9, TBI 0.6 LEFT ABI 1.1, TBI 0.6 with triphasic flow bilaterally. We will continue his same treatment plan. He has been educated on compression therapy and need for elevation. He will benefit from lymphedema pumps 11/01/17-He is here in follow-up evaluation for left lower extremity ulcer. The right lower extremity remains healed. He has home health services, but they have not been out to see the patient for 2-3 weeks. He states it home health physical therapy changed his dressing yesterday after therapy; he placed Ace wrap compression. We are still waiting for lymphedema pumps, reordered d/t need for company change. 11/08/17-He is here in follow-up evaluation for left lower extremity ulcer. It is improved. Edema is significantly improved with compression therapy. We will continue with same treatment plan and he will follow-up next week. No word regarding lymphedema pumps 11/15/17-He is here in follow-up evaluation for left lower extremity ulcer. He is healed and will be discharged from wound care services. I have reached out to [facility] regarding his lymphedema pumps. They have been unable to reach the patient; the contact number they had with the patient's wife's cell phone and she has not answered any unrecognized calls. Contact should be made today, trial planned for next week; [facility] will continue to follow 11/27/17 on  evaluation today patient has multiple blistered areas over the right lower extremity his left lower extremity appears to be doing okay. These blistered areas show signs of no infection which is great news. With that being said he did have some necrotic skin overlying which was mechanically debrided away with saline and gauze today without complication. Overall post debridement the wounds appear to be doing better but in general his swelling seems to be increased. This is obviously not good news. I think this is what has given rise to the blisters. 12/04/17 on evaluation today patient presents for follow-up concerning his bilateral lower extremity edema in the right lower extremity ulcers. He has been tolerating the dressing changes without complication. With that being said he has had no real issues with the wraps which is also good news. Overall I'm pleased with the progress he's been making. SERGEI, DELO ([id number]) 12/11/17 on evaluation today patient appears to be doing rather well in regard to his right lateral lower extremity ulcer. He's been tolerating the dressing changes without complication. Fortunately there does not appear to be any evidence of infection at this time. Overall I'm pleased with the progress that is being made. Unfortunately he has been in the hospital due to having what sounds to be a stomach virus/flu fortunately that is starting to get better. 12/18/17 on evaluation today patient actually appears to be doing very well in regard to his bilateral lower extremities the swelling is under fairly good control his lymphedema pumps are still not up and running quite yet. With that being said he does have several areas of opening noted as far as wounds are concerned mainly over the left lower extremity. With that being said I do believe once he gets lymphedema pumps this would least help you mention some the fluid and preventing this from occurring. Hopefully that will be set up  soon sleeves are Artie in place at his home he just waiting for the machine. 12/25/17 on evaluation today patient actually appears to be doing excellent in fact all of his  ulcers appear to have resolved his legs appear very well. I do think he needs compression stockings we have discussed this and they are actually going to go to [place] today to elastic therapy to get this fitted for him. I think that is definitely a good thing to do. Readmission: 04/09/18 upon evaluation today this patient is seen for readmission due to bilateral lower extremity lymphedema. He has significant swelling of his extremities especially on the left although the right is also swollen he has weeping from both sides. There are no obvious open wounds at this point. Fortunately he has been doing fairly well for quite a bit of time since I last saw him. Nonetheless unfortunately this seems to have reopened and is giving quite a bit of trouble. He states this began about a week ago when he first called [place] to get in to be seen. No fevers, chills, nausea, or vomiting noted at this time. He has not been using his lymphedema pumps due to the fact that they won't fit on his leg at this point likewise is also not been using his compression for essentially the same reason. Wound History Patient reportedly has not tested positive for osteomyelitis. Patient reportedly has had testing performed to evaluate circulation in the legs. Patient History Information obtained from Patient. Allergies Corticosteroids (Glucocorticoids) Family History Cancer - Paternal Grandparents, Kidney Disease - Siblings, Lung Disease - Mother, No family history of Diabetes, Heart Disease, Hereditary Spherocytosis, Hypertension, Seizures, Stroke, Thyroid Problems, Tuberculosis. Social History Never smoker, Marital Status - Married, Alcohol Use - Never, Drug Use - No History, Caffeine Use - Never. Medical History Hospitalization/Surgery History -  11/03/2013, [facility], bronchitis. Medical And Surgical History Notes Respiratory home O2 at night as needed Cardiovascular varicose veins Neurologic CVA - 1992 Review of Systems (ROS) Eyes The patient has no complaints or symptoms. KYSER, WANDEL ([id number]) Ear/Nose/Mouth/Throat The patient has no complaints or symptoms. Hematologic/Lymphatic The patient has no complaints or symptoms. Respiratory The patient has no complaints or symptoms. Cardiovascular The patient has no complaints or symptoms. Gastrointestinal The patient has no complaints or symptoms. Endocrine The patient has no complaints or symptoms. Genitourinary The patient has no complaints or symptoms. Immunological The patient has no complaints or symptoms. Integumentary (Skin) The patient has no complaints or symptoms. Musculoskeletal The patient has no complaints or symptoms. Neurologic The patient has no complaints or symptoms. Oncologic The patient has no complaints or symptoms. Objective Constitutional sitting or standing blood pressure is within target range for patient.. pulse regular and within target range for patient.Marland Kitchen respirations regular, non-labored and within target range for patient.Marland Kitchen temperature within target range for patient.. Obese and well-hydrated in no acute distress. Vitals Time Taken: 8:45 AM, Height: 66 in, Weight: 323 lbs, BMI: 52.1, Temperature: 97.8 F, Pulse: 72 bpm, Respiratory Rate: 18 breaths/min, Blood Pressure: 131/60 mmHg. Eyes conjunctiva clear no eyelid edema noted. pupils equal round and reactive to light and accommodation. Ears, Nose, Mouth, and Throat no gross abnormality of ear auricles or external auditory canals. normal hearing noted during conversation. mucus membranes moist. Respiratory normal breathing without difficulty. clear to auscultation bilaterally. Cardiovascular regular rate and rhythm with normal S1, S2. no clubbing, cyanosis, significant  edema, Gastrointestinal (GI) soft, non-tender, non-distended, +BS. no ventral hernia noted. DENISE, BRAMBLETT ([id number]) Musculoskeletal Patient unable to walk without assistance. Psychiatric this patient is able to make decisions and demonstrates good insight into disease process. Alert and Oriented x 3. pleasant and cooperative. General Notes: Patient appears to  have no evidence of open wounds at this time although he does have bilateral lower extremity stage III lymphedema with fibrosis changes in regard to his skin. In general I feel like he has worsened quite a been since I last saw him. He is about 15 cm larger in regard to his left calf region compared to last evaluation where we discharged him. Obviously this is indicative of significant swelling fortunately he tells me he has no issues with breathing at this time. Other Condition(s) Patient presents with Lymphedema located on the Bilateral Leg. The skin appearance exhibited: Hemosiderin Staining, Induration. The skin appearance did not exhibit: Atrophie Blanche, Callus, Crepitus, Cyanosis, Dry/Scaly, Ecchymosis, Erythema, Excoriation, Friable, Maceration, Mottled, Pallor, Rash, Rubor, Scarring. General Notes: Patient's legs are swollen and leaking fluid bilateraly Assessment Active Problems ICD-10 Cellulitis of right lower limb Cellulitis of left lower limb Lymphedema, not elsewhere classified Obesity, unspecified Procedures There was a Three Layer Compression Therapy Procedure with a pre-treatment ABI of 1.1 by Montey Hora, RN. Post procedure Diagnosis Wound #: Same as Pre-Procedure Plan Wound Cleansing: May Shower, gently pat wound dry prior to applying new dressing. - Please do not get your wraps wet but you can remove the wraps prior to nurse visits and wound clinic visits and shower and wash your legs Skin Barriers/Peri-Wound Care: Moisturizing lotion Secondary Dressing: XtraSorb - on any draining  lymphedema Dressing Change Frequency: LOWEN, BARRINGER ([id number]) Other: - twice weekly - on Tuesdays and Fridays. Patient comes to [place] on Tuesdays, [facility] to change wraps on Fridays Follow-up Appointments: Return Appointment in 1 week. Edema Control: 3 Layer Compression System - Bilateral - may anchor with unna if needed Home Health: [place] for [place] Nurse may visit PRN to address patient s wound care needs. FACE TO FACE ENCOUNTER: MEDICARE and MEDICAID PATIENTS: I certify that this patient is under my care and that I had a face-to-face encounter that meets the physician face-to-face encounter requirements with this patient on this date. The encounter with the patient was in whole or in part for the following MEDICAL CONDITION: (primary reason for [place]) MEDICAL NECESSITY: I certify, that based on my findings, NURSING services are a medically necessary home health service. HOME BOUND STATUS: I certify that my clinical findings support that this patient is homebound (i.e., Due to illness or injury, pt requires aid of supportive devices such as crutches, cane, wheelchairs, walkers, the use of special transportation or the assistance of another person to leave their place of residence. There is a normal inability to leave the home and doing so requires considerable and taxing effort. Other absences are for medical reasons / religious services and are infrequent or of short duration when for other reasons). If current dressing causes regression in wound condition, may D/C ordered dressing product/s and apply Normal Saline Moist Dressing daily until next [place] / Other MD appointment. Mayer of regression in wound condition at [phone]. Please direct any NON-WOUND related issues/requests for orders to patient's Primary Care Physician My suggestion at this point is going to be that we initiate  between layer compression wraps which have in the past been of great benefit for him as far as getting the swelling down. Subsequently I think he is going to benefit from potentially increasing his fluid pills all of this is not something that I was advocating he do at this point rather that he contact his primary care provider to  discuss whether or not that is something they would recommend for him obviously I do not want to interfere or cause any problems in this regard. Patient and his wife were both instructed of this and their gonna contact the primary care provider. Subsequently we will see him back for reevaluation in one week to see were things stand. If anything changes or worsens in the meantime he will contact the office and let me know. Please see above for specific wound care orders. We will see patient for re-evaluation in 1 week(s) here in the clinic. If anything worsens or changes patient will contact our office for additional recommendations. Electronic Signature(s) Signed: 04/12/2018 1:32:49 PM By: Worthy Keeler PA-C Entered By: Worthy Keeler on 04/12/2018 13:19:45 [name] ([id number]) -------------------------------------------------------------------------------- ROS/PFSH Details Patient Name: [name] Date of Service: 04/09/2018 8:45 AM Medical Record Number: [id number] Patient Account Number: [id number] Date of Birth/Sex: 05/18/1946 (72 y.o. M) Treating RN: Cornell Barman Primary Care Provider: [name] Other Clinician: Referring Provider: Referral, Self Treating Provider/Extender: STONE III, [name]: 0 Information Obtained From Patient Wound History Do you currently have one or more open woundso No Have you tested positive for osteomyelitis (bone infection)o No Have you had any tests for circulation on your legso Yes Eyes Complaints and Symptoms: No Complaints or Symptoms Medical History: Negative for: Cataracts; Glaucoma; Optic  Neuritis Ear/Nose/Mouth/Throat Complaints and Symptoms: No Complaints or Symptoms Medical History: Negative for: Chronic sinus problems/congestion; Middle ear problems Hematologic/Lymphatic Complaints and Symptoms: No Complaints or Symptoms Medical History: Positive for: Anemia - aplastic anemia; Lymphedema Negative for: Hemophilia; Human Immunodeficiency Virus; Sickle Cell Disease Respiratory Complaints and Symptoms: No Complaints or Symptoms Medical History: Positive for: Chronic Obstructive Pulmonary Disease (COPD); Sleep Apnea Negative for: Aspiration; Asthma; Pneumothorax; Tuberculosis Past Medical History Notes: home O2 at night as needed Cardiovascular Complaints and Symptoms: No Complaints or Symptoms Medical History: Positive for: Congestive Heart Failure; Coronary Artery Disease; Hypertension; Peripheral Venous Disease FANNIE, ALOMAR ([id number]) Negative for: Angina; Arrhythmia; Deep Vein Thrombosis; Hypotension; Myocardial Infarction; Peripheral Arterial Disease; Phlebitis; Vasculitis Past Medical History Notes: varicose veins Gastrointestinal Complaints and Symptoms: No Complaints or Symptoms Medical History: Negative for: Cirrhosis ; Colitis; Crohnos; Hepatitis A; Hepatitis B; Hepatitis C Endocrine Complaints and Symptoms: No Complaints or Symptoms Medical History: Positive for: Type II Diabetes Negative for: Type I Diabetes Time with diabetes: since 2006 Treated with: Insulin Blood sugar tested every day: Yes Tested : Blood sugar testing results: Breakfast: 209 Genitourinary Complaints and Symptoms: No Complaints or Symptoms Medical History: Negative for: End Stage Renal Disease Immunological Complaints and Symptoms: No Complaints or Symptoms Medical History: Negative for: Lupus Erythematosus; Raynaudos; Scleroderma Integumentary (Skin) Complaints and Symptoms: No Complaints or Symptoms Medical History: Negative for: History of Burn; History  of pressure wounds Musculoskeletal Complaints and Symptoms: No Complaints or Symptoms Medical History: Positive for: Osteoarthritis KHAI, ARRONA ([id number]) Negative for: Gout; Rheumatoid Arthritis; Osteomyelitis Neurologic Complaints and Symptoms: No Complaints or Symptoms Medical History: Positive for: Neuropathy Negative for: Dementia; Quadriplegia; Paraplegia; Seizure Disorder Past Medical History Notes: CVA - 1992 Oncologic Complaints and Symptoms: No Complaints or Symptoms Medical History: Positive for: Received Chemotherapy - last dose 2006 Negative for: Received Radiation Psychiatric Medical History: Negative for: Anorexia/bulimia; Confinement Anxiety Immunizations Pneumococcal Vaccine: Received Pneumococcal Vaccination: Yes Implantable Devices Hospitalization / Surgery History Name of Hospital Purpose of Hospitalization/Surgery Date [facility] bronchitis 11/03/2013 Family and Social History Cancer: Yes - Paternal Grandparents; Diabetes: No; Heart Disease: No; Hereditary  Spherocytosis: No; Hypertension: No; Kidney Disease: Yes - Siblings; Lung Disease: Yes - Mother; Seizures: No; Stroke: No; Thyroid Problems: No; Tuberculosis: No; Never smoker; Marital Status - Married; Alcohol Use: Never; Drug Use: No History; Caffeine Use: Never; Financial Concerns: No; Food, Clothing or Shelter Needs: No; Support System Lacking: No; Transportation Concerns: No; Advanced Directives: No; Patient does not want information on Administrator) Signed: 04/11/2018 10:02:59 AM By: Gretta Cool, BSN, RN, CWS, Kim RN, BSN Signed: 04/12/2018 1:32:49 PM By: Worthy Keeler PA-C Entered By: Gretta Cool, BSN, RN, CWS, Kim on 04/09/2018 08:56:52 RUDRANSH, BELLANCA ([id number]) -------------------------------------------------------------------------------- Mission Details Patient Name: [name] Date of Service: 04/09/2018 Medical Record Number: [id number] Patient Account Number:  [id number] Date of Birth/Sex: 03-Aug-1945 (72 y.o. M) Treating RN: Montey Hora Primary Care Provider: [name] Other Clinician: Referring Provider: Referral, Self Treating Provider/Extender: Melburn Hake, [name]: 0 Diagnosis Coding ICD-10 Codes Code Description L03.115 Cellulitis of right lower limb L03.116 Cellulitis of left lower limb I89.0 Lymphedema, not elsewhere classified E66.9 Obesity, unspecified Facility Procedures CPT4: Description Modifier Quantity Code [id number] - WOUND CARE VISIT-LEV 3 EST PT 1 CPT4: 02542706 [id number] BILATERAL: Application of multi-layer venous compression system; leg (below 1 knee), including ankle and foot. Physician Procedures CPT4 Code: [id number] Description: 16073 - WC PHYS LEVEL 4 - EST PT ICD-10 Diagnosis Description L03.115 Cellulitis of right lower limb L03.116 Cellulitis of left lower limb I89.0 Lymphedema, not elsewhere classified E66.9 Obesity, unspecified Modifier: Quantity: 1 Electronic Signature(s) Signed: 04/12/2018 1:32:49 PM By: Worthy Keeler PA-C Previous Signature: 04/09/2018 10:08:16 AM Version By: Montey Hora Entered By: Worthy Keeler on 04/10/2018 07:23:13

## 2018-04-16 ENCOUNTER — Encounter: Payer: Medicare Other | Admitting: Physician Assistant

## 2018-04-16 ENCOUNTER — Inpatient Hospital Stay
Admission: EM | Admit: 2018-04-16 | Discharge: 2018-04-18 | DRG: 291 | Disposition: A | Discharge status: Home Health | Payer: Medicare Other | Attending: Internal Medicine | Admitting: Internal Medicine

## 2018-04-16 ENCOUNTER — Emergency Department: Payer: Medicare Other

## 2018-04-16 ENCOUNTER — Other Ambulatory Visit: Payer: Self-pay

## 2018-04-16 ENCOUNTER — Encounter: Payer: Self-pay | Admitting: Emergency Medicine

## 2018-04-16 DIAGNOSIS — E11628 Type 2 diabetes mellitus with other skin complications: Secondary | ICD-10-CM | POA: Diagnosis not present

## 2018-04-16 DIAGNOSIS — Z9111 Patient's noncompliance with dietary regimen: Secondary | ICD-10-CM

## 2018-04-16 DIAGNOSIS — Z9049 Acquired absence of other specified parts of digestive tract: Secondary | ICD-10-CM | POA: Diagnosis not present

## 2018-04-16 DIAGNOSIS — Z8673 Personal history of transient ischemic attack (TIA), and cerebral infarction without residual deficits: Secondary | ICD-10-CM

## 2018-04-16 DIAGNOSIS — Z8349 Family history of other endocrine, nutritional and metabolic diseases: Secondary | ICD-10-CM | POA: Diagnosis not present

## 2018-04-16 DIAGNOSIS — Z79899 Other long term (current) drug therapy: Secondary | ICD-10-CM

## 2018-04-16 DIAGNOSIS — E1151 Type 2 diabetes mellitus with diabetic peripheral angiopathy without gangrene: Secondary | ICD-10-CM | POA: Diagnosis present

## 2018-04-16 DIAGNOSIS — I83009 Varicose veins of unspecified lower extremity with ulcer of unspecified site: Secondary | ICD-10-CM | POA: Diagnosis present

## 2018-04-16 DIAGNOSIS — I2510 Atherosclerotic heart disease of native coronary artery without angina pectoris: Secondary | ICD-10-CM | POA: Diagnosis present

## 2018-04-16 DIAGNOSIS — E1142 Type 2 diabetes mellitus with diabetic polyneuropathy: Secondary | ICD-10-CM | POA: Diagnosis not present

## 2018-04-16 DIAGNOSIS — H9190 Unspecified hearing loss, unspecified ear: Secondary | ICD-10-CM | POA: Diagnosis present

## 2018-04-16 DIAGNOSIS — J449 Chronic obstructive pulmonary disease, unspecified: Secondary | ICD-10-CM | POA: Diagnosis present

## 2018-04-16 DIAGNOSIS — N189 Chronic kidney disease, unspecified: Secondary | ICD-10-CM

## 2018-04-16 DIAGNOSIS — H538 Other visual disturbances: Secondary | ICD-10-CM | POA: Diagnosis present

## 2018-04-16 DIAGNOSIS — Z951 Presence of aortocoronary bypass graft: Secondary | ICD-10-CM | POA: Diagnosis not present

## 2018-04-16 DIAGNOSIS — Z8249 Family history of ischemic heart disease and other diseases of the circulatory system: Secondary | ICD-10-CM | POA: Diagnosis not present

## 2018-04-16 DIAGNOSIS — G4733 Obstructive sleep apnea (adult) (pediatric): Secondary | ICD-10-CM | POA: Diagnosis not present

## 2018-04-16 DIAGNOSIS — E782 Mixed hyperlipidemia: Secondary | ICD-10-CM | POA: Diagnosis present

## 2018-04-16 DIAGNOSIS — N179 Acute kidney failure, unspecified: Secondary | ICD-10-CM | POA: Diagnosis not present

## 2018-04-16 DIAGNOSIS — I890 Lymphedema, not elsewhere classified: Secondary | ICD-10-CM | POA: Diagnosis present

## 2018-04-16 DIAGNOSIS — Z833 Family history of diabetes mellitus: Secondary | ICD-10-CM

## 2018-04-16 DIAGNOSIS — I872 Venous insufficiency (chronic) (peripheral): Secondary | ICD-10-CM | POA: Diagnosis present

## 2018-04-16 DIAGNOSIS — M545 Low back pain: Secondary | ICD-10-CM | POA: Diagnosis not present

## 2018-04-16 DIAGNOSIS — Z7982 Long term (current) use of aspirin: Secondary | ICD-10-CM

## 2018-04-16 DIAGNOSIS — G4700 Insomnia, unspecified: Secondary | ICD-10-CM | POA: Diagnosis present

## 2018-04-16 DIAGNOSIS — N183 Chronic kidney disease, stage 3 (moderate): Secondary | ICD-10-CM | POA: Diagnosis not present

## 2018-04-16 DIAGNOSIS — J810 Acute pulmonary edema: Secondary | ICD-10-CM | POA: Diagnosis present

## 2018-04-16 DIAGNOSIS — D631 Anemia in chronic kidney disease: Secondary | ICD-10-CM | POA: Diagnosis present

## 2018-04-16 DIAGNOSIS — I130 Hypertensive heart and chronic kidney disease with heart failure and stage 1 through stage 4 chronic kidney disease, or unspecified chronic kidney disease: Secondary | ICD-10-CM | POA: Diagnosis not present

## 2018-04-16 DIAGNOSIS — E1122 Type 2 diabetes mellitus with diabetic chronic kidney disease: Secondary | ICD-10-CM | POA: Diagnosis present

## 2018-04-16 DIAGNOSIS — E1140 Type 2 diabetes mellitus with diabetic neuropathy, unspecified: Secondary | ICD-10-CM | POA: Diagnosis not present

## 2018-04-16 DIAGNOSIS — Z6841 Body Mass Index (BMI) 40.0 and over, adult: Secondary | ICD-10-CM

## 2018-04-16 DIAGNOSIS — I8390 Asymptomatic varicose veins of unspecified lower extremity: Secondary | ICD-10-CM | POA: Diagnosis present

## 2018-04-16 DIAGNOSIS — Z794 Long term (current) use of insulin: Secondary | ICD-10-CM

## 2018-04-16 DIAGNOSIS — Z791 Long term (current) use of non-steroidal anti-inflammatories (NSAID): Secondary | ICD-10-CM

## 2018-04-16 DIAGNOSIS — L97909 Non-pressure chronic ulcer of unspecified part of unspecified lower leg with unspecified severity: Secondary | ICD-10-CM | POA: Diagnosis not present

## 2018-04-16 DIAGNOSIS — R011 Cardiac murmur, unspecified: Secondary | ICD-10-CM | POA: Diagnosis present

## 2018-04-16 DIAGNOSIS — I509 Heart failure, unspecified: Secondary | ICD-10-CM

## 2018-04-16 DIAGNOSIS — Z79891 Long term (current) use of opiate analgesic: Secondary | ICD-10-CM

## 2018-04-16 DIAGNOSIS — G8929 Other chronic pain: Secondary | ICD-10-CM | POA: Diagnosis not present

## 2018-04-16 DIAGNOSIS — I5033 Acute on chronic diastolic (congestive) heart failure: Secondary | ICD-10-CM | POA: Diagnosis present

## 2018-04-16 DIAGNOSIS — K219 Gastro-esophageal reflux disease without esophagitis: Secondary | ICD-10-CM | POA: Diagnosis present

## 2018-04-16 DIAGNOSIS — E876 Hypokalemia: Secondary | ICD-10-CM | POA: Diagnosis present

## 2018-04-16 HISTORY — DX: Unspecified diastolic (congestive) heart failure: I50.30

## 2018-04-16 HISTORY — DX: Lymphedema, not elsewhere classified: I89.0

## 2018-04-16 LAB — BASIC METABOLIC PANEL
ANION GAP: 10 (ref 5–15)
BUN: 37 mg/dL — ABNORMAL HIGH (ref 8–23)
CO2: 29 mmol/L (ref 22–32)
CREATININE: 2.2 mg/dL — AB (ref 0.61–1.24)
Calcium: 9 mg/dL (ref 8.9–10.3)
Chloride: 98 mmol/L (ref 98–111)
GFR, EST AFRICAN AMERICAN: 32 mL/min — AB (ref >60)
GFR, EST NON AFRICAN AMERICAN: 28 mL/min — AB (ref >60)
Glucose, Bld: 163 mg/dL — ABNORMAL HIGH (ref 70–99)
Potassium: 3.3 mmol/L — ABNORMAL LOW (ref 3.5–5.1)
SODIUM: 137 mmol/L (ref 135–145)

## 2018-04-16 LAB — TROPONIN I: Troponin I: <0.03 ng/mL (ref <0.03)

## 2018-04-16 LAB — HEMOGLOBIN A1C
Hgb A1c MFr Bld: 6.7 % — ABNORMAL HIGH (ref 4.8–5.6)
Mean Plasma Glucose: 145.59 mg/dL

## 2018-04-16 LAB — BRAIN NATRIURETIC PEPTIDE: B Natriuretic Peptide: 29 pg/mL (ref 0.0–100.0)

## 2018-04-16 LAB — CBC WITH DIFFERENTIAL/PLATELET
Abs Immature Granulocytes: 0.07 10*3/uL (ref 0.00–0.07)
BASOS ABS: 0.1 10*3/uL (ref 0.0–0.1)
Basophils Relative: 0 %
EOS PCT: 3 %
Eosinophils Absolute: 0.3 10*3/uL (ref 0.0–0.5)
HEMATOCRIT: 37.9 % — AB (ref 39.0–52.0)
HEMOGLOBIN: 12.7 g/dL — AB (ref 13.0–17.0)
Immature Granulocytes: 1 %
LYMPHS ABS: 1.3 10*3/uL (ref 0.7–4.0)
Lymphocytes Relative: 12 %
MCH: 32.3 pg (ref 26.0–34.0)
MCHC: 33.5 g/dL (ref 30.0–36.0)
MCV: 96.4 fL (ref 80.0–100.0)
MONO ABS: 0.6 10*3/uL (ref 0.1–1.0)
MONOS PCT: 6 %
Neutro Abs: 8.8 10*3/uL — ABNORMAL HIGH (ref 1.7–7.7)
Neutrophils Relative %: 78 %
Platelets: 151 10*3/uL (ref 150–400)
RBC: 3.93 MIL/uL — ABNORMAL LOW (ref 4.22–5.81)
RDW: 12.7 % (ref 11.5–15.5)
WBC: 11.2 10*3/uL — ABNORMAL HIGH (ref 4.0–10.5)
nRBC: 0 % (ref 0.0–0.2)

## 2018-04-16 LAB — TSH: TSH: 1.978 u[IU]/mL (ref 0.350–4.500)

## 2018-04-16 LAB — URINALYSIS, COMPLETE (UACMP) WITH MICROSCOPIC
BILIRUBIN URINE: NEGATIVE
Glucose, UA: NEGATIVE mg/dL
KETONES UR: NEGATIVE mg/dL
LEUKOCYTES UA: NEGATIVE
Nitrite: NEGATIVE
PROTEIN: NEGATIVE mg/dL
SQUAMOUS EPITHELIAL / LPF: NONE SEEN (ref 0–5)
Specific Gravity, Urine: 1.009 (ref 1.005–1.030)
pH: 5 (ref 5.0–8.0)

## 2018-04-16 LAB — GLUCOSE, CAPILLARY: Glucose-Capillary: 253 mg/dL — ABNORMAL HIGH (ref 70–99)

## 2018-04-16 MED ORDER — ACETAMINOPHEN 650 MG RE SUPP: 650.0000 mg | Freq: Four times a day (QID) | RECTAL | Status: DC | PRN

## 2018-04-16 MED ORDER — INSULIN GLARGINE 100 UNIT/ML ~~LOC~~ SOLN
30.0000 [IU] | Freq: Every day | SUBCUTANEOUS | Status: DC
  Administered 2018-04-16 – 2018-04-17 (×2): 30 [IU] via SUBCUTANEOUS
  Filled 2018-04-16 (×3): qty 0.3

## 2018-04-16 MED ORDER — GABAPENTIN 300 MG PO CAPS
300.0000 mg | ORAL_CAPSULE | Freq: Two times a day (BID) | ORAL | Status: DC
  Administered 2018-04-16 – 2018-04-18 (×4): 300 mg via ORAL
  Filled 2018-04-16 (×4): qty 1

## 2018-04-16 MED ORDER — ONDANSETRON HCL 4 MG PO TABS: 4.0000 mg | ORAL_TABLET | Freq: Four times a day (QID) | ORAL | Status: DC | PRN

## 2018-04-16 MED ORDER — DOCUSATE SODIUM 100 MG PO CAPS
100.0000 mg | ORAL_CAPSULE | Freq: Two times a day (BID) | ORAL | Status: DC
  Administered 2018-04-17 – 2018-04-18 (×3): 100 mg via ORAL
  Filled 2018-04-16 (×4): qty 1

## 2018-04-16 MED ORDER — CARVEDILOL 25 MG PO TABS
25.0000 mg | ORAL_TABLET | Freq: Two times a day (BID) | ORAL | Status: DC
  Administered 2018-04-16 – 2018-04-18 (×4): 25 mg via ORAL
  Filled 2018-04-16 (×3): qty 1
  Filled 2018-04-16: qty 2

## 2018-04-16 MED ORDER — ACETAMINOPHEN 325 MG PO TABS: 650.0000 mg | ORAL_TABLET | Freq: Four times a day (QID) | ORAL | Status: DC | PRN

## 2018-04-16 MED ORDER — PANTOPRAZOLE SODIUM 40 MG PO TBEC
40.0000 mg | DELAYED_RELEASE_TABLET | Freq: Every day | ORAL | Status: DC
  Administered 2018-04-17 – 2018-04-18 (×2): 40 mg via ORAL
  Filled 2018-04-16 (×2): qty 1

## 2018-04-16 MED ORDER — FUROSEMIDE 10 MG/ML IJ SOLN
40.0000 mg | Freq: Once | INTRAMUSCULAR | Status: AC
  Administered 2018-04-16: 40 mg via INTRAVENOUS
  Filled 2018-04-16: qty 4

## 2018-04-16 MED ORDER — FUROSEMIDE 10 MG/ML IJ SOLN
40.0000 mg | Freq: Two times a day (BID) | INTRAMUSCULAR | Status: DC
  Administered 2018-04-17 – 2018-04-18 (×3): 40 mg via INTRAVENOUS
  Filled 2018-04-16 (×3): qty 4

## 2018-04-16 MED ORDER — OXYCODONE HCL 5 MG PO TABS
5.0000 mg | ORAL_TABLET | Freq: Four times a day (QID) | ORAL | Status: DC | PRN
  Administered 2018-04-16 – 2018-04-17 (×3): 10 mg via ORAL
  Filled 2018-04-16 (×4): qty 2

## 2018-04-16 MED ORDER — HYDRALAZINE HCL 50 MG PO TABS
25.0000 mg | ORAL_TABLET | Freq: Three times a day (TID) | ORAL | Status: DC
  Administered 2018-04-16 – 2018-04-18 (×5): 25 mg via ORAL
  Filled 2018-04-16 (×5): qty 1

## 2018-04-16 MED ORDER — POTASSIUM CHLORIDE CRYS ER 20 MEQ PO TBCR
20.0000 meq | EXTENDED_RELEASE_TABLET | Freq: Two times a day (BID) | ORAL | Status: DC
  Administered 2018-04-16 – 2018-04-18 (×4): 20 meq via ORAL
  Filled 2018-04-16 (×4): qty 1

## 2018-04-16 MED ORDER — ADULT MULTIVITAMIN W/MINERALS CH
1.0000 | ORAL_TABLET | Freq: Every day | ORAL | Status: DC
  Administered 2018-04-17 – 2018-04-18 (×2): 1 via ORAL
  Filled 2018-04-16 (×2): qty 1

## 2018-04-16 MED ORDER — INSULIN ASPART 100 UNIT/ML ~~LOC~~ SOLN
0.0000 [IU] | Freq: Three times a day (TID) | SUBCUTANEOUS | Status: DC
  Administered 2018-04-17: 5 [IU] via SUBCUTANEOUS
  Administered 2018-04-17: 3 [IU] via SUBCUTANEOUS
  Administered 2018-04-17: 5 [IU] via SUBCUTANEOUS
  Administered 2018-04-18 (×2): 3 [IU] via SUBCUTANEOUS
  Filled 2018-04-16 (×6): qty 1

## 2018-04-16 MED ORDER — IPRATROPIUM-ALBUTEROL 0.5-2.5 (3) MG/3ML IN SOLN
3.0000 mL | Freq: Once | RESPIRATORY_TRACT | Status: AC
  Administered 2018-04-16: 3 mL via RESPIRATORY_TRACT
  Filled 2018-04-16: qty 3

## 2018-04-16 MED ORDER — IPRATROPIUM-ALBUTEROL 0.5-2.5 (3) MG/3ML IN SOLN: 3.0000 mL | RESPIRATORY_TRACT | Status: DC | PRN

## 2018-04-16 MED ORDER — METHYLPREDNISOLONE SODIUM SUCC 125 MG IJ SOLR
125.0000 mg | Freq: Once | INTRAMUSCULAR | Status: AC
  Administered 2018-04-16: 125 mg via INTRAVENOUS
  Filled 2018-04-16: qty 2

## 2018-04-16 MED ORDER — ONDANSETRON HCL 4 MG/2ML IJ SOLN: 4.0000 mg | Freq: Four times a day (QID) | INTRAMUSCULAR | Status: DC | PRN

## 2018-04-16 MED ORDER — PENTOXIFYLLINE ER 400 MG PO TBCR
400.0000 mg | EXTENDED_RELEASE_TABLET | Freq: Three times a day (TID) | ORAL | Status: DC
  Administered 2018-04-17 – 2018-04-18 (×5): 400 mg via ORAL
  Filled 2018-04-16 (×6): qty 1

## 2018-04-16 MED ORDER — ATORVASTATIN CALCIUM 20 MG PO TABS
40.0000 mg | ORAL_TABLET | Freq: Every day | ORAL | Status: DC
  Administered 2018-04-16 – 2018-04-17 (×2): 40 mg via ORAL
  Filled 2018-04-16 (×2): qty 2

## 2018-04-16 MED ORDER — ZOLPIDEM TARTRATE 5 MG PO TABS
5.0000 mg | ORAL_TABLET | Freq: Every evening | ORAL | Status: DC | PRN
  Administered 2018-04-16 – 2018-04-17 (×2): 5 mg via ORAL
  Filled 2018-04-16 (×2): qty 1

## 2018-04-16 MED ORDER — INSULIN ASPART 100 UNIT/ML ~~LOC~~ SOLN
0.0000 [IU] | Freq: Every day | SUBCUTANEOUS | Status: DC
  Administered 2018-04-16 – 2018-04-17 (×2): 3 [IU] via SUBCUTANEOUS
  Filled 2018-04-16 (×2): qty 1

## 2018-04-16 MED ORDER — SENNOSIDES-DOCUSATE SODIUM 8.6-50 MG PO TABS
1.0000 | ORAL_TABLET | Freq: Two times a day (BID) | ORAL | Status: DC
  Administered 2018-04-17 – 2018-04-18 (×3): 1 via ORAL
  Filled 2018-04-16 (×4): qty 1

## 2018-04-16 MED ORDER — DOCUSATE SODIUM 50 MG PO CAPS
50.0000 mg | ORAL_CAPSULE | Freq: Two times a day (BID) | ORAL | Status: DC
  Filled 2018-04-16: qty 1

## 2018-04-16 MED ORDER — ASPIRIN EC 325 MG PO TBEC
325.0000 mg | DELAYED_RELEASE_TABLET | Freq: Every day | ORAL | Status: DC
  Administered 2018-04-16 – 2018-04-18 (×3): 325 mg via ORAL
  Filled 2018-04-16 (×3): qty 1

## 2018-04-16 MED ORDER — FENTANYL 50 MCG/HR TD PT72: 50.0000 ug | MEDICATED_PATCH | TRANSDERMAL | Status: DC

## 2018-04-16 MED ORDER — FEBUXOSTAT 40 MG PO TABS
40.0000 mg | ORAL_TABLET | Freq: Every day | ORAL | Status: DC
  Administered 2018-04-17 – 2018-04-18 (×2): 40 mg via ORAL
  Filled 2018-04-16 (×2): qty 1

## 2018-04-16 MED ORDER — LIRAGLUTIDE 18 MG/3ML ~~LOC~~ SOPN: 1.8000 mg | PEN_INJECTOR | Freq: Every day | SUBCUTANEOUS | Status: DC

## 2018-04-16 MED ORDER — HEPARIN SODIUM (PORCINE) 5000 UNIT/ML IJ SOLN
5000.0000 [IU] | Freq: Three times a day (TID) | INTRAMUSCULAR | Status: DC
  Administered 2018-04-16 – 2018-04-18 (×5): 5000 [IU] via SUBCUTANEOUS
  Filled 2018-04-16 (×5): qty 1

## 2018-04-16 NOTE — ED Notes (Signed)
Phlebotomist called for blood draw

## 2018-04-16 NOTE — Progress Notes (Signed)
Pt is refusing to wear our CPAP. Pt states he can only wear nasal pillows, which we can not provide. Stated he would pull a nasal mask off. Encouraged pt to have someone bring in home unit and he stated, he would ask his wife if he stayed tomorrow night.

## 2018-04-16 NOTE — ED Notes (Signed)
Patient transported to X-ray 

## 2018-04-16 NOTE — ED Triage Notes (Signed)
Pt states he is not supposed to take steroids due to aplastic anemia and elevated glucose levels.

## 2018-04-16 NOTE — ED Triage Notes (Signed)
Pt has had multiple falls over the past week and has to recently purchase a wheelchair to assist with mobility.

## 2018-04-16 NOTE — ED Provider Notes (Signed)
[facility] Emergency Department Provider Note  ____________________________________________  Time seen: Approximately 3:07 PM  I have reviewed the triage vital signs and the nursing notes.   HISTORY  Chief Complaint Shortness of Breath and Leg Swelling   HPI RANE BLITCH is a 72 y.o. male with a history of obesity, chronic venous stasis, chronic bronchitis, CAD, CHF with preserved EF, hypertension, hyperlipidemia who presents for evaluation of cough and shortness of breath.  Patient reports 2 to 3 weeks of cough productive of thick grey phlegm, no fever or chills. Progressively worsening shortness of breath that is only present with exertion and resolves at rest.  Today he went to the wound clinic for his regular checkup and was sent to the emergency room to be evaluated for possible pneumonia.  Patient recently finished doxycycline for cellulitis of bilateral lower extremities.  According to the wound doctor his legs look much improved today.  Patient agrees with that.  He does have weeping from the left lower extremity which is larger than the right lower extremity however that is chronic for him.  No history of PE or DVT.  He denies any chest pain.  He is not on blood thinners.  Patient has had some wheezing as well.   Past Medical History:  Diagnosis Date  . Anemia   . CAD (coronary artery disease)   . Chest pain   . Coronary artery disease   . Diabetes mellitus without complication ([place])   . Esophageal reflux   . Herpes zoster without mention of complication   . Hyperlipidemia   . Hypertension   . Insomnia   . Lumbago   . Neuropathy in diabetes ([place])   . Obstructive chronic bronchitis without exacerbation ([place])   . Other malaise and fatigue   . Stroke ([place])   . Varicose veins     Patient Active Problem List   Diagnosis Date Noted  . Achilles tendon disorder, right 04/02/2018  . Essential hypertension 01/28/2018  . Lymphedema of both lower  extremities 01/28/2018  . Wheezing 10/25/2017  . Cellulitis and abscess of lower extremity 09/20/2017  . Edema of both lower legs due to peripheral venous insufficiency 09/20/2017  . Cellulitis 09/20/2017  . Atopic dermatitis 09/02/2017  . Chronic pain disorder 09/02/2017  . Acute gout of right knee 08/31/2017  . AKI (acute kidney injury) ([place]) 08/29/2017  . Other malaise and fatigue 07/09/2017  . CVA (cerebral vascular accident) ([address]) 09/15/2016  . 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 CVA (cerebral infarction) 02/12/2015  . Chronic diastolic CHF (congestive heart failure), NYHA class 3 ([place]) 11/30/2014  . Benign essential hypertension 11/06/2014  . CAD (coronary artery disease) of artery bypass graft 01/19/2014  . CKD (chronic kidney disease), stage III ([place]) 01/19/2014  . Acute bronchitis with COPD ([place]) 01/19/2014  . Diabetic neuropathy ([place]) 01/19/2014  . Insulin dependent diabetes mellitus ([place]) 01/19/2014  . OSA (obstructive sleep apnea) 01/19/2014  . Mixed hyperlipidemia 10/29/2013  . Peripheral vascular disease, unspecified ([place]) 09/29/2013    Past Surgical History:  Procedure Laterality Date  . CHOLECYSTECTOMY    . CORONARY ARTERY BYPASS GRAFT      Prior to Admission medications   Medication Sig Start Date End Date Taking? Authorizing Provider  albuterol (PROVENTIL HFA;VENTOLIN HFA) 108 (90 Base) MCG/ACT inhaler INHALE 1 PUFF INTO THE LUNGS EVERY 6 HOURS AS NEEDED FOR WHEEZING OR SHORTNESS OF BREATH 03/11/18   Devona Konig A, MD  amoxicillin-clavulanate (AUGMENTIN) 875-125 MG tablet Take 1  tablet by mouth 2 (two) times daily. Patient not taking: Reported on 04/01/2018 09/25/17   [name], MD  aspirin 325 MG EC tablet Take 325 mg by mouth daily.    [provider]  atorvastatin (LIPITOR) 40 MG tablet Take 40 mg by mouth at bedtime.     [provider]  carvedilol (COREG) 25 MG tablet Take 25 mg by mouth 2 (two) times daily with a meal.     [provider]  colchicine 0.6 MG tablet Take 1 tablet (0.6 mg total) by mouth daily. 08/31/17 08/31/18  Dustin Flock, MD  docusate sodium (COLACE) 50 MG capsule Take 50 mg by mouth 2 (two) times daily.    [provider]  doxycycline (VIBRA-TABS) 100 MG tablet Take 1 tablet (100 mg total) by mouth 2 (two) times daily. 04/01/18   Ronnell Freshwater, NP  febuxostat (ULORIC) 40 MG tablet Take 1 tablet (40 mg total) by mouth daily. 02/12/18   [name], MD  fentaNYL (DURAGESIC - DOSED MCG/HR) 50 MCG/HR Place 1 patch (50 mcg total) onto the skin every 3 (three) days. 03/26/18   Kendell Bane, NP  furosemide (LASIX) 20 MG tablet Take 20 mg by mouth.    [provider]  gabapentin (NEURONTIN) 300 MG capsule Take 1 capsule (300 mg total) by mouth 2 (two) times daily. 02/12/18   [name], MD  hydrALAZINE (APRESOLINE) 25 MG tablet TAKE 1 TABLET BY MOUTH THREE TIMES DAILY FOR BLOOD PRESSURE 02/12/18   [name], MD  ibuprofen (ADVIL,MOTRIN) 600 MG tablet Take 600 mg by mouth 2 (two) times daily as needed for mild pain or moderate pain.     [provider]  indapamide (LOZOL) 2.5 MG tablet Take 1 tablet (2.5 mg total) by mouth daily. 11/27/17   [name], MD  Insulin [facility] [employer] KWIKPEN) 100 UNIT/ML SOPN INJECT 30 TO 36 UNITS UNDER THE SKIN EVERY DAY 03/26/18   Kendell Bane, NP  ipratropium-albuterol (DUONEB) 0.5-2.5 (3) MG/3ML SOLN Take 3 mLs by nebulization every 4 (four) hours as needed (for shortness of breath). 09/10/17   [name], MD  liraglutide (VICTOZA) 18 MG/3ML SOPN Inject 0.3 mLs (1.8 mg total) into the skin daily. 03/26/18   Kendell Bane, NP  methylPREDNISolone (MEDROL) 4 MG TBPK tablet Take by mouth as directed for 6 days Patient not taking: Reported on 04/01/2018 10/25/17   Ronnell Freshwater, NP  metolazone (ZAROXOLYN) 2.5 MG tablet TAKE 1 TABLET BY MOUTH DAILY 12/26/17   Ronnell Freshwater, NP  metolazone (ZAROXOLYN) 2.5 MG  tablet Take 1 tablet (2.5 mg total) by mouth daily. 02/12/18   [name], MD  Multiple Vitamin (MULTIVITAMIN WITH MINERALS) TABS tablet Take 1 tablet by mouth daily.    [provider]  omeprazole (PRILOSEC) 20 MG capsule Take 20 mg by mouth daily.    [provider]  Oxycodone HCl 10 MG TABS Half to one tab as needed prn for pain Patient taking differently: Half to one tab  prn for pain 11/28/17   [name], MD  pentoxifylline (TRENTAL) 400 MG CR tablet TAKE 1 TABLET BY MOUTH THREE TIMES DAILY FOR CLAUDICATION 03/11/18   Ronnell Freshwater, NP  potassium chloride SA (K-DUR,KLOR-CON) 20 MEQ tablet Take 20 mEq by mouth 2 (two) times daily.    [provider]  sennosides-docusate sodium (SENOKOT-S) 8.6-50 MG tablet Take 1-2 tablets by mouth 2 (two) times daily.  [provider]  UREA 20 INTENSIVE HYDRATING 20 % cream USE AS DIRECTED 3 TIMES A WEEK 09/12/17   [name], MD  zolpidem (AMBIEN CR) 12.5 MG CR tablet Take 1 tablet (12.5 mg total) by mouth at bedtime as needed for sleep. 03/08/18   Kendell Bane, NP    Allergies Patient has no known allergies.  Family History  Problem Relation Age of Onset  . Diabetes Mother   . Hyperlipidemia Mother   . Hypertension Mother   . Diabetes Father   . Hyperlipidemia Father   . Hypertension Father     Social History Social History   Tobacco Use  . Smoking status: Never Smoker  . Smokeless tobacco: Never Used  Substance Use Topics  . Alcohol use: No  . Drug use: No    Review of Systems  Constitutional: Negative for fever. Eyes: Negative for visual changes. ENT: Negative for sore throat. Neck: No neck pain  Cardiovascular: Negative for chest pain. Respiratory: + shortness of breath, wheezing, cough Gastrointestinal: Negative for abdominal pain, vomiting or diarrhea. Genitourinary: Negative for dysuria. Musculoskeletal: Negative for back pain. Skin: Negative for rash. Neurological:  Negative for headaches, weakness or numbness. Psych: No SI or HI  ____________________________________________   PHYSICAL EXAM:  VITAL SIGNS: ED Triage Vitals  Enc Vitals Group     BP 04/16/18 1432 123/79     Pulse Rate 04/16/18 1432 72     Resp 04/16/18 1432 16     Temp 04/16/18 1432 98.1 F (36.7 C)     Temp Source 04/16/18 1432 Oral     SpO2 04/16/18 1432 98 %     Weight 04/16/18 1428 (!) 322 lb 6.4 oz (146.2 kg)     Height 04/16/18 1428 5\' 6"  (1.676 m)     Head Circumference --      Peak Flow --      Pain Score 04/16/18 1427 7     Pain Loc --      Pain Edu? --      Excl. in [place]? --     Constitutional: Alert and oriented, no distress.  HEENT:      Head: Normocephalic and atraumatic.         Eyes: Conjunctivae are normal. Sclera is non-icteric.       Mouth/Throat: Mucous membranes are moist.       Neck: Supple with no signs of meningismus. Cardiovascular: Regular rate and rhythm. No murmurs, gallops, or rubs. 2+ symmetrical distal pulses are present in all extremities. No JVD. Respiratory: Normal respiratory effort.  Normal sats, normal air movement bilaterally with expiratory wheezes  Gastrointestinal: Soft, non tender, and non distended with positive bowel sounds. No rebound or guarding. Musculoskeletal: Asymmetric swelling of bilateral lower extremity with left greater than right, pitting edema, changes of chronic venous stasis, there is some redness and weeping of the left lower extremity but no warmth Neurologic: Normal speech and language. Face is symmetric. Moving all extremities. No gross focal neurologic deficits are appreciated. Skin: Skin is warm, dry and intact. No rash noted. Psychiatric: Mood and affect are normal. Speech and behavior are normal.  ____________________________________________   LABS (all labs ordered are listed, but only abnormal results are displayed)  Labs Reviewed  BASIC METABOLIC PANEL - Abnormal; Notable for the following components:       Result Value   Potassium 3.3 (*)    Glucose, Bld 163 (*)    BUN 37 (*)    Creatinine, Ser 2.20 (*)  GFR calc non Af Amer 28 (*)    GFR calc Af Amer 32 (*)    All other components within normal limits  CBC WITH DIFFERENTIAL/PLATELET - Abnormal; Notable for the following components:   WBC 11.2 (*)    RBC 3.93 (*)    Hemoglobin 12.7 (*)    HCT 37.9 (*)    Neutro Abs 8.8 (*)    All other components within normal limits  TROPONIN I  BRAIN NATRIURETIC PEPTIDE   ____________________________________________  EKG  ED ECG REPORT I, Rudene Re, the attending physician, personally viewed and interpreted this ECG.  Normal sinus rhythm, rate of 74, normal intervals, normal axis, no ST elevations or depressions.  Normal EKG. ____________________________________________  RADIOLOGY  I have personally reviewed the images performed during this visit and I agree with the Radiologist's read.   Interpretation by Radiologist:  Dg Chest 2 View  Result Date: 04/16/2018 CLINICAL DATA:  Wheezing. EXAM: CHEST - 2 VIEW COMPARISON:  Radiograph of August 29, 2017. FINDINGS: Stable cardiomegaly. No pneumothorax is noted. Status post coronary bypass graft. Mild bibasilar subsegmental atelectasis is noted. No significant pleural effusion is noted. Bony thorax is unremarkable. IMPRESSION: Mild bibasilar subsegmental atelectasis is noted. Stable cardiomegaly with central pulmonary vascular congestion. Electronically Signed   By: Marijo Conception, M.D.   On: 04/16/2018 15:31   US Venous Img Lower Bilateral  Result Date: 04/16/2018 CLINICAL DATA:  Asymmetric leg swelling for 1 week EXAM: BILATERAL LOWER EXTREMITY VENOUS DUPLEX ULTRASOUND TECHNIQUE: Doppler venous assessment of the bilateral lower extremity deep venous system was performed, including characterization of spectral flow, compressibility, and plasticity. COMPARISON:  None. FINDINGS: There is complete compressibility of the bilateral  common femoral, femoral, and popliteal veins. Doppler analysis demonstrates respiratory phasicity and augmentation of flow with calf compression. No obvious superficial vein or calf vein thrombosis. IMPRESSION: No evidence of lower extremity DVT. Electronically Signed   By: Marybelle Killings M.D.   On: 04/16/2018 16:50      ____________________________________________   PROCEDURES  Procedure(s) performed: None Procedures Critical Care performed: yes  CRITICAL CARE Performed by: Rudene Re  ?  Total critical care time: 35 min  Critical care time was exclusive of separately billable procedures and treating other patients.  Critical care was necessary to treat or prevent imminent or life-threatening deterioration.  Critical care was time spent personally by me on the following activities: development of treatment plan with patient and/or surrogate as well as nursing, discussions with consultants, evaluation of patient's response to treatment, examination of patient, obtaining history from patient or surrogate, ordering and performing treatments and interventions, ordering and review of laboratory studies, ordering and review of radiographic studies, pulse oximetry and re-evaluation of patient's condition.  ____________________________________________   INITIAL IMPRESSION / ASSESSMENT AND PLAN / ED COURSE   72 y.o. male with a history of obesity, chronic venous stasis, chronic bronchitis, CAD, CHF with preserved EF, hypertension, hyperlipidemia who presents for evaluation of cough and shortness of breath x 3 weeks.  Patient has normal work of breathing, normal sats, does have wheezing bilaterally concerning for bronchitis versus COPD versus pneumonia versus viral URI versus CHF.  Will treat with steroids and DuoNeb.  Will get a chest x-ray to rule out pneumonia. Labs pending. EKG with no ischemic changes. Patient does have asymmetric leg swelling which is chronic for him. Doppler US will  be done to rule out DVT. Low suspicion for PE with no tachycardia, hypoxia, tachypnea, or chest pain.  If Doppler  is negative we will not pursue CT injury at this time.  Clinical Course as of Apr 16 1699  Tue Apr 16, 2018  1659 Work up consistent with CHF exacerbation with pulmonary edema however patient also found to have acute on chronic kidney injury.  Therefore I believe it safe for the patient gets admitted to the hospital for diuresis and close monitoring of his kidney function.  We will give 40 mg of IV Lasix and discussed with the hospitalist for admission.   [CV]    Clinical Course User Index [CV] Alfred Levins [place], MD     As part of my medical decision making, I reviewed the following data within the [place] notes reviewed and incorporated, Labs reviewed , EKG interpreted , Old EKG reviewed, Old chart reviewed, Radiograph reviewed , Discussed with admitting physician , Notes from prior ED visits and [place] Controlled Substance Database    Pertinent labs & imaging results that were available during my care of the patient were reviewed by me and considered in my medical decision making (see chart for details).    ____________________________________________   FINAL CLINICAL IMPRESSION(S) / ED DIAGNOSES  Final diagnoses:  Acute pulmonary edema ([place])  Acute kidney injury superimposed on chronic kidney disease ([facility])  Acute on chronic congestive heart failure, unspecified heart failure type ([place])      NEW MEDICATIONS STARTED DURING THIS VISIT:  ED Discharge Orders    None       Note:  This document was prepared using Dragon voice recognition software and may include unintentional dictation errors.    Rudene Re, MD 04/16/18 [phone]

## 2018-04-16 NOTE — ED Triage Notes (Signed)
Pt arrived via EMS from the [facility] with reports of wheezing and increased leg swelling. Provider at [place] concerned pt may have PNA.  Pt reports shortness of breath over the past few weeks.  Pt being seen at wound center for chronic leg wounds.    Pt has productive cough that he describes as gray and milky

## 2018-04-16 NOTE — ED Notes (Signed)
kalisetti at bedside

## 2018-04-16 NOTE — H&P (Signed)
[facility] [place] NAME: Adam Merritt    MR#:  [id number]  DATE OF BIRTH:  12/18/1945  DATE OF ADMISSION:  04/16/2018  PRIMARY CARE PHYSICIAN: [name], MD   REQUESTING/REFERRING PHYSICIAN: Dr. Rudene Re  CHIEF COMPLAINT:   Chief Complaint  Patient presents with  . Shortness of Breath  . Leg Swelling    HISTORY OF PRESENT ILLNESS:  Adam Merritt  is a 72 y.o. male with a known history of CAD status post CABG, diastolic CHF, diabetes mellitus, tonic lymphedema, diabetic neuropathy, history of stroke, anemia of chronic disease presents from wound care clinic secondary to worsening weakness and dyspnea.  Patient feels like for the past month he has been gaining weight, went to see his cardiologist about a month ago and was restarted on his low-dose Lasix apart from metolazone he is taking daily.  He was 22 pounds overweight at the time.  He has chronic lymphedema of bilateral lower extremities which is managed by his outpatient wound physician well.  He feels like he has put on more weight over the last 2 weeks, continues to feel extremely fatigued and tired.  Had 3 falls in the last couple of days secondary to weakness and is currently using a wheelchair instead of his cane and walker.  His wife does not cook and they eat outside food every day.  Denies drinking too much fluids.  His labs also indicate that his renal function is worsening at this time.  Chest x-ray shows pulmonary congestion, however BNP is within normal limits and his renal function is worse than baseline.  PAST MEDICAL HISTORY:   Past Medical History:  Diagnosis Date  . Anemia   . CAD (coronary artery disease)   . Chest pain   . Coronary artery disease   . Diabetes mellitus without complication ([place])   . Diastolic CHF ([place])   . Esophageal reflux   . Herpes zoster without mention of complication   . Hyperlipidemia   . Hypertension   . Insomnia   .  Lumbago   . Lymphedema   . Neuropathy in diabetes ([place])   . Obstructive chronic bronchitis without exacerbation ([place])   . Other malaise and fatigue   . Stroke ([place])   . Varicose veins     PAST SURGICAL HISTORY:   Past Surgical History:  Procedure Laterality Date  . CHOLECYSTECTOMY    . CORONARY ARTERY BYPASS GRAFT      SOCIAL HISTORY:   Social History   Tobacco Use  . Smoking status: Never Smoker  . Smokeless tobacco: Never Used  Substance Use Topics  . Alcohol use: No    Comment: quit alcohol 30 years ago    FAMILY HISTORY:   Family History  Problem Relation Age of Onset  . Diabetes Mother   . Hyperlipidemia Mother   . Hypertension Mother   . Diabetes Father   . Hyperlipidemia Father   . Hypertension Father   . CAD Father     DRUG ALLERGIES:  No Known Allergies  REVIEW OF SYSTEMS:   Review of Systems  Constitutional: Positive for malaise/fatigue. Negative for chills, fever and weight loss.  HENT: Positive for hearing loss. Negative for ear discharge, ear pain, nosebleeds and tinnitus.   Eyes: Positive for blurred vision. Negative for double vision and photophobia.  Respiratory: Positive for shortness of breath. Negative for cough, hemoptysis and wheezing.   Cardiovascular: Positive for orthopnea and leg swelling. Negative  for chest pain and palpitations.  Gastrointestinal: Negative for abdominal pain, constipation, diarrhea, melena, nausea and vomiting.  Genitourinary: Negative for dysuria, frequency, hematuria and urgency.  Musculoskeletal: Positive for back pain and myalgias. Negative for neck pain.  Skin: Negative for rash.  Neurological: Negative for dizziness, tingling, tremors, sensory change, speech change, focal weakness and headaches.  Endo/Heme/Allergies: Does not bruise/bleed easily.  Psychiatric/Behavioral: Negative for depression.    MEDICATIONS AT HOME:   Prior to Admission medications   Medication Sig Start Date End Date Taking?  Authorizing Provider  albuterol (PROVENTIL HFA;VENTOLIN HFA) 108 (90 Base) MCG/ACT inhaler INHALE 1 PUFF INTO THE LUNGS EVERY 6 HOURS AS NEEDED FOR WHEEZING OR SHORTNESS OF BREATH Patient taking differently: Inhale 1 puff into the lungs every 6 (six) hours as needed for wheezing or shortness of breath.  03/11/18  Yes Allyne Gee, MD  aspirin 325 MG EC tablet Take 325 mg by mouth daily.   Yes [provider]  atorvastatin (LIPITOR) 40 MG tablet Take 40 mg by mouth at bedtime.    Yes [provider]  carvedilol (COREG) 25 MG tablet Take 25 mg by mouth 2 (two) times daily with a meal.   Yes [provider]  colchicine 0.6 MG tablet Take 1 tablet (0.6 mg total) by mouth daily. 08/31/17 08/31/18 Yes Dustin Flock, MD  docusate sodium (COLACE) 50 MG capsule Take 50 mg by mouth 2 (two) times daily.   Yes [provider]  febuxostat (ULORIC) 40 MG tablet Take 1 tablet (40 mg total) by mouth daily. 02/12/18  Yes [name], MD  fentaNYL (DURAGESIC - DOSED MCG/HR) 50 MCG/HR Place 1 patch (50 mcg total) onto the skin every 3 (three) days. 03/26/18  Yes Scarboro, Audie Clear, NP  furosemide (LASIX) 20 MG tablet Take 20 mg by mouth daily.    Yes [provider]  gabapentin (NEURONTIN) 300 MG capsule Take 1 capsule (300 mg total) by mouth 2 (two) times daily. 02/12/18  Yes [name], MD  hydrALAZINE (APRESOLINE) 25 MG tablet TAKE 1 TABLET BY MOUTH THREE TIMES DAILY FOR BLOOD PRESSURE Patient taking differently: Take 25 mg by mouth 3 (three) times daily.  02/12/18  Yes [name], MD  ibuprofen (ADVIL,MOTRIN) 600 MG tablet Take 600 mg by mouth 2 (two) times daily as needed for mild pain or moderate pain.    Yes [provider]  indapamide (LOZOL) 2.5 MG tablet Take 1 tablet (2.5 mg total) by mouth daily. 11/27/17  Yes [name], MD  Insulin Glargine (BASAGLAR KWIKPEN) 100 UNIT/ML SOPN INJECT 30 TO 36 UNITS UNDER THE SKIN EVERY DAY Patient taking  differently: Inject 30-36 Units into the skin daily.  03/26/18  Yes Scarboro, Audie Clear, NP  ipratropium-albuterol (DUONEB) 0.5-2.5 (3) MG/3ML SOLN Take 3 mLs by nebulization every 4 (four) hours as needed (for shortness of breath). 09/10/17  Yes [name], MD  liraglutide (VICTOZA) 18 MG/3ML SOPN Inject 0.3 mLs (1.8 mg total) into the skin daily. 03/26/18  Yes Scarboro, Audie Clear, NP  metolazone (ZAROXOLYN) 2.5 MG tablet Take 1 tablet (2.5 mg total) by mouth daily. 02/12/18  Yes [name], MD  Multiple Vitamin (MULTIVITAMIN WITH MINERALS) TABS tablet Take 1 tablet by mouth daily.   Yes [provider]  omeprazole (PRILOSEC) 20 MG capsule Take 20 mg by mouth daily.   Yes [provider]  Oxycodone HCl 10 MG TABS Half to one tab as needed prn for pain  Patient taking differently: Half to one tab  prn for pain 11/28/17  Yes [name], MD  pentoxifylline (TRENTAL) 400 MG CR tablet TAKE 1 TABLET BY MOUTH THREE TIMES DAILY FOR CLAUDICATION Patient taking differently: Take 400 mg by mouth 3 (three) times daily.  03/11/18  Yes Boscia, Heather E, NP  potassium chloride SA (K-DUR,KLOR-CON) 20 MEQ tablet Take 20 mEq by mouth 2 (two) times daily.   Yes [provider]  sennosides-docusate sodium (SENOKOT-S) 8.6-50 MG tablet Take 1-2 tablets by mouth 2 (two) times daily.   Yes [provider]  UREA 20 INTENSIVE HYDRATING 20 % cream USE AS DIRECTED 3 TIMES A WEEK 09/12/17  Yes [name], MD  zolpidem (AMBIEN CR) 12.5 MG CR tablet Take 1 tablet (12.5 mg total) by mouth at bedtime as needed for sleep. 03/08/18  Yes Scarboro, Audie Clear, NP  doxycycline (VIBRA-TABS) 100 MG tablet Take 1 tablet (100 mg total) by mouth 2 (two) times daily. Patient not taking: Reported on 04/16/2018 04/01/18   Ronnell Freshwater, NP      VITAL SIGNS:  Blood pressure (!) 158/72, pulse 72, temperature 98.1 F (36.7 C), temperature source Oral, resp. rate 15, height 5\' 6"  (1.676 m), weight (!) 146.2  kg, SpO2 97 %.  PHYSICAL EXAMINATION:   Physical Exam  GENERAL:  72 y.o.-year-old obese patient lying in the bed with no acute distress.  Hard of hearing EYES: Pupils equal, round, reactive to light and accommodation. No scleral icterus. Extraocular muscles intact.  Decreased visual acuity especially in the right eye. HEENT: Head atraumatic, normocephalic. Oropharynx and nasopharynx clear.  NECK:  Supple, no jugular venous distention. No thyroid enlargement, no tenderness.  LUNGS: Normal breath sounds bilaterally, no  rhonchi or crepitation. No use of accessory muscles of respiration. Fine bibasilar wheezing and decreased breath sounds at the bases.  CARDIOVASCULAR: S1, S2 normal. No  rubs, or gallops. 2/6 systolic murmur present ABDOMEN: Soft, obese, nontender, nondistended. Bowel sounds present. No organomegaly or mass.  EXTREMITIES: No  cyanosis, or clubbing. Bilateral lower extremity lymphedema, left worse than right, superficial abrasions with raw areas noted on left leg NEUROLOGIC: Cranial nerves II through XII are intact. Muscle strength 5/5 in all extremities- the lower extremities weaker than the upper. Sensation intact. Gait not checked. Global weakness noted PSYCHIATRIC: The patient is alert and oriented x 3.  SKIN: No obvious rash, lesion, or ulcer.   LABORATORY PANEL:   CBC Recent Labs  Lab 04/16/18 1536  WBC 11.2*  HGB 12.7*  HCT 37.9*  PLT 151   ------------------------------------------------------------------------------------------------------------------  Chemistries  Recent Labs  Lab 04/16/18 1536  NA 137  K 3.3*  CL 98  CO2 29  GLUCOSE 163*  BUN 37*  CREATININE 2.20*  CALCIUM 9.0   ------------------------------------------------------------------------------------------------------------------  Cardiac Enzymes Recent Labs  Lab 04/16/18 1536  TROPONINI <0.03    ------------------------------------------------------------------------------------------------------------------  RADIOLOGY:  Dg Chest 2 View  Result Date: 04/16/2018 CLINICAL DATA:  Wheezing. EXAM: CHEST - 2 VIEW COMPARISON:  Radiograph of August 29, 2017. FINDINGS: Stable cardiomegaly. No pneumothorax is noted. Status post coronary bypass graft. Mild bibasilar subsegmental atelectasis is noted. No significant pleural effusion is noted. Bony thorax is unremarkable. IMPRESSION: Mild bibasilar subsegmental atelectasis is noted. Stable cardiomegaly with central pulmonary vascular congestion. Electronically Signed   By: Marijo Conception, M.D.   On: 04/16/2018 15:31   US Venous Img Lower Bilateral  Result Date: 04/16/2018 CLINICAL DATA:  Asymmetric leg swelling for 1 week EXAM:  BILATERAL LOWER EXTREMITY VENOUS DUPLEX ULTRASOUND TECHNIQUE: Doppler venous assessment of the bilateral lower extremity deep venous system was performed, including characterization of spectral flow, compressibility, and plasticity. COMPARISON:  None. FINDINGS: There is complete compressibility of the bilateral common femoral, femoral, and popliteal veins. Doppler analysis demonstrates respiratory phasicity and augmentation of flow with calf compression. No obvious superficial vein or calf vein thrombosis. IMPRESSION: No evidence of lower extremity DVT. Electronically Signed   By: Marybelle Killings M.D.   On: 04/16/2018 16:50    EKG:   Orders placed or performed during the hospital encounter of 04/16/18  . ED EKG within 10 minutes  . ED EKG within 10 minutes  . EKG 12-Lead  . EKG 12-Lead  . EKG 12-Lead  . EKG 12-Lead    IMPRESSION AND PLAN:   Adam Merritt  is a 72 y.o. male with a known history of CAD status post CABG, diastolic CHF, diabetes mellitus, tonic lymphedema, diabetic neuropathy, history of stroke, anemia of chronic disease presents from wound care clinic secondary to worsening weakness and dyspnea.  1.   Acute on chronic diastolic CHF exacerbation-increased weight gain. -Admit to telemetry, daily weights, strict input and output monitoring -Noncompliant with diet as eats outside food every day. -Hold metolazone, continue IV Lasix for now. -Cardiology consulted -Repeat echocardiogram.  Last echo from last year showing EF of 60 to 65%  2.  Acute renal failure on CKD stage III-Baseline creatinine seems to be around 1.4.  However most recent labs from [facility] hospital are not available, and that have showed worsening of his renal function according to PCP notes. -Check renal ultrasound.  Now that he will be receiving IV Lasix, need to monitor renal function well. -Nephrology has been consulted -Avoid nephrotoxins -Check urine analysis  3.  Chronic lower extremity edema/lymphedema-follows with vascular and also wound care clinic. -Continue dressing changes as recommended.  Left lower extremity is always larger than the right, Dopplers negative for DVT  4.  Diabetes mellitus-continue Victoza, Lantus and sliding scale insulin  5.  Obstructive sleep apnea-continue CPAP  6.  Chronic low back pain-continue fentanyl patch.  Also pain medications as needed  7.  DVT prophylaxis-subcutaneous heparin  Physical therapy consulted secondary to ongoing weakness   All the records are reviewed and case discussed with ED provider. Management plans discussed with the patient, family and they are in agreement.  CODE STATUS: Full code  TOTAL TIME TAKING CARE OF THIS PATIENT: 55 minutes.    Gladstone Lighter M.D on 04/16/2018 at 5:59 PM  Between 7am to 6pm - Pager - [phone]  After 6pm go to [email or web address] - password [email or web address] [facility]  Office  [phone]  CC: Primary care physician; [name], MD

## 2018-04-17 ENCOUNTER — Inpatient Hospital Stay: Payer: Medicare Other

## 2018-04-17 DIAGNOSIS — I130 Hypertensive heart and chronic kidney disease with heart failure and stage 1 through stage 4 chronic kidney disease, or unspecified chronic kidney disease: Secondary | ICD-10-CM | POA: Diagnosis not present

## 2018-04-17 DIAGNOSIS — J810 Acute pulmonary edema: Secondary | ICD-10-CM | POA: Diagnosis not present

## 2018-04-17 LAB — CBC
HCT: 36.4 % — ABNORMAL LOW (ref 39.0–52.0)
HEMOGLOBIN: 12.7 g/dL — AB (ref 13.0–17.0)
MCH: 32.8 pg (ref 26.0–34.0)
MCHC: 34.9 g/dL (ref 30.0–36.0)
MCV: 94.1 fL (ref 80.0–100.0)
Platelets: 153 10*3/uL (ref 150–400)
RBC: 3.87 MIL/uL — ABNORMAL LOW (ref 4.22–5.81)
RDW: 12.3 % (ref 11.5–15.5)
WBC: 10.1 10*3/uL (ref 4.0–10.5)
nRBC: 0 % (ref 0.0–0.2)

## 2018-04-17 LAB — TROPONIN I: Troponin I: <0.03 ng/mL (ref <0.03)

## 2018-04-17 LAB — BASIC METABOLIC PANEL
Anion gap: 11 (ref 5–15)
BUN: 39 mg/dL — AB (ref 8–23)
CO2: 29 mmol/L (ref 22–32)
Calcium: 9 mg/dL (ref 8.9–10.3)
Chloride: 97 mmol/L — ABNORMAL LOW (ref 98–111)
Creatinine, Ser: 1.89 mg/dL — ABNORMAL HIGH (ref 0.61–1.24)
GFR, EST AFRICAN AMERICAN: 39 mL/min — AB (ref >60)
GFR, EST NON AFRICAN AMERICAN: 34 mL/min — AB (ref >60)
Glucose, Bld: 263 mg/dL — ABNORMAL HIGH (ref 70–99)
POTASSIUM: 3.5 mmol/L (ref 3.5–5.1)
SODIUM: 137 mmol/L (ref 135–145)

## 2018-04-17 LAB — GLUCOSE, CAPILLARY
Glucose-Capillary: 244 mg/dL — ABNORMAL HIGH (ref 70–99)
Glucose-Capillary: 213 mg/dL — ABNORMAL HIGH (ref 70–99)
Glucose-Capillary: 182 mg/dL — ABNORMAL HIGH (ref 70–99)
Glucose-Capillary: 255 mg/dL — ABNORMAL HIGH (ref 70–99)

## 2018-04-17 NOTE — Progress Notes (Signed)
Wound care nurse in to see patient.  Una boots applied to BLLE.  Patient states no complaints at time. Call bell in reach.

## 2018-04-17 NOTE — Plan of Care (Signed)
  Problem: Activity: Goal: Risk for activity intolerance will decrease Outcome: Progressing Note:  Up with one assist standing at the bedside. Tolerating well   Problem: Coping: Goal: Level of anxiety will decrease Outcome: Progressing   Problem: Elimination: Goal: Will not experience complications related to urinary retention Outcome: Progressing   Problem: Pain Managment: Goal: General experience of comfort will improve Outcome: Progressing Note:  Pt having leg/feet/back pain, treated once with 40m oxycodone with relief.    Problem: Safety: Goal: Ability to remain free from injury will improve Outcome: Progressing   Problem: Education: Goal: Knowledge of General Education information will improve Description Including pain rating scale, medication(s)/side effects and non-pharmacologic comfort measures Outcome: Completed/Met   Problem: Nutrition: Goal: Adequate nutrition will be maintained Outcome: Not Applicable

## 2018-04-17 NOTE — Progress Notes (Signed)
Pt refused cpap tonight. 

## 2018-04-17 NOTE — Consult Note (Signed)
[facility] Nurse wound consult note Reason for Consult: Chronic venous insufficiency. Lymphedema.  Recently completed course of antibiotics for cellulitis.  Left leg with >edema and erythema than right leg.  Patient states his legs hurt continuously.  This is not new.  Wound type: Chronic nonhealing nonintact skin to left lower leg.  Chronic edema, requiring compression.  Pressure Injury POA: NA Measurement: Circumferential sloughing epithelium to left lower leg near malleolus.  Wound bed: Drainage (amount, consistency, odor)  Periwound: Dressing procedure/placement/frequency: Cleanse bilateral lower legs with soap and water and pat dry.  Apply Aquacel Ag to nonintact lesion left lower leg.  Wrap bilateral legs with zinc layer from below toes to below knee.  Secure with self adherent coban. Change twice weekly. [facility] team will follow.  Domenic Moras MSN, RN, FNP-BC CWON Wound, Ostomy, Continence Nurse Pager [phone]

## 2018-04-17 NOTE — Progress Notes (Signed)
Inpatient Diabetes Program Recommendations  AACE/ADA: New Consensus Statement on Inpatient Glycemic Control (2019)  Target Ranges:  Prepandial:   less than 140 mg/dL      Peak postprandial:   less than 180 mg/dL (1-2 hours)      Critically ill patients:  140 - 180 mg/dL   Results for Adam Merritt, Adam Merritt (MRN [id number]) as of 04/17/2018 08:44  Ref. Range 04/16/2018 21:24 04/17/2018 08:02  Glucose-Capillary Latest Ref Range: 70 - 99 mg/dL 253 (H) 244 (H)   Results for Adam Merritt, Adam Merritt (MRN [id number]) as of 04/17/2018 08:44  Ref. Range 04/16/2018 19:19  Hemoglobin A1C Latest Ref Range: 4.8 - 5.6 % 6.7 (H)   Review of Glycemic Control  Diabetes history: DM2 Outpatient Diabetes medications: Basaglar 30-36 units daily, Victoza 1.8 mg daily Current orders for Inpatient glycemic control: Lantus 30 units QHS, Novolog 0-15 units TID with meals, Novolog 0-5 units QHS  Inpatient Diabetes Program Recommendations:  Insulin - Basal: Please consider increasing Lantus to 34 units QHS. Insulin - Meal Coverage: If glucose is consistently greater than 180 mg/dl, please consider ordering Novolog 3 units TID with meals for meal coverage if patient eats at least 50% of meals.  Thanks, Barnie Alderman, RN, MSN, CDE Diabetes Coordinator Inpatient Diabetes Program [phone] (Team Pager from 8am to 5pm)

## 2018-04-17 NOTE — Care Management Note (Addendum)
Case Management Note  Patient Details  Name: Adam Merritt MRN: [id number] Date of Birth: 02-Apr-1946  Subjective/Objective:                 Patient sent to the ED from the wound care center due to increased lower extremity edema and wheezing. Was being treated at the clinic for ulcers on lower extremities due to venous insufficiency  He lives at home with his wife who provides transportation to appointments.  He has a walker. Was to have been followed at a lymphoedema clinic in 2018 but he did not follow through. He has oxygen in the home that is for nocturnal.  He has scales but admits to not weighing himself.  He denies having issues obtaining his medications or accessing medical care. Says has never been to the heart failure clinic. Would be open to consider home health   Action/Plan:  Will need home oxygen assessment prior to discharge. Referral to Heart Failure Clinic.  Anticipate home health referral for nursing for CHF, wound, diet and DM management.   [facility] referral   Expected Discharge Date:                  Expected Discharge Plan:     In-House Referral:     Discharge planning Services     Post Acute Care Choice:    Choice offered to:     DME Arranged:    DME Agency:     HH Arranged:    HH Agency:     Status of Service:     If discussed at [employer], dates discussed:    Additional Comments:  Katrina Stack, RN 04/17/2018, 1:21 PM

## 2018-04-17 NOTE — Progress Notes (Signed)
[facility]  ROUNDING NOTE   Subjective:  Patient well-known to [place] from admission in March 2019. Presents now with acute on chronic diastolic heart failure as well as lower extremity edema and weight gain. He now has acute renal failure but this does appear to be improving with Lasix.    Objective:  Vital signs in last 24 hours:  Temp:  [97.5 F (36.4 C)-98.1 F (36.7 C)] 97.7 F (36.5 C) (11/13 0804) Pulse Rate:  [72-87] 76 (11/13 0804) Resp:  [15-18] 18 (11/13 0804) BP: (123-160)/(60-88) 132/60 (11/13 0804) SpO2:  [95 %-98 %] 95 % (11/13 0804) Weight:  [146.2 kg] 146.2 kg (11/12 1428)  Weight change:  Filed Weights   04/16/18 1428  Weight: (!) 146.2 kg    Intake/Output: I/O last 3 completed shifts: In: -  Out: 2900 [Urine:2900]   Intake/Output this shift:  Total I/O In: 120 [P.O.:120] Out: 800 [Urine:800]  Physical Exam: General: No acute distress  Head: Normocephalic, atraumatic. Moist oral mucosal membranes  Eyes: Anicteric  Neck: Supple, trachea midline  Lungs:  Diminished b/l, exam degraded by obesity  Heart: S1S2 no rubs  Abdomen:  Soft, nontender, bowel sounds present  Extremities: 3+ peripheral edema, legs wrapped  Neurologic: Awake, alert, following commands  Skin: LE's wrapped       Basic Metabolic Panel: Recent Labs  Lab 04/16/18 1536 04/17/18 0542  NA 137 137  K 3.3* 3.5  CL 98 97*  CO2 29 29  GLUCOSE 163* 263*  BUN 37* 39*  CREATININE 2.20* 1.89*  CALCIUM 9.0 9.0    Liver Function Tests: No results for input(s): AST, ALT, ALKPHOS, BILITOT, PROT, ALBUMIN in the last 168 hours. No results for input(s): LIPASE, AMYLASE in the last 168 hours. No results for input(s): AMMONIA in the last 168 hours.  CBC: Recent Labs  Lab 04/16/18 1536 04/17/18 0542  WBC 11.2* 10.1  NEUTROABS 8.8*  --   HGB 12.7* 12.7*  HCT 37.9* 36.4*  MCV 96.4 94.1  PLT 151 153    Cardiac Enzymes: Recent Labs  Lab 04/16/18 1536  04/16/18 1919 04/16/18 2335 04/17/18 0542  TROPONINI <0.03 <0.03 <0.03 <0.03    BNP: Invalid input(s): POCBNP  CBG: Recent Labs  Lab 04/16/18 2124 04/17/18 0802 04/17/18 1149  GLUCAP 253* 244* 21*    Microbiology: Results for orders placed or performed during the hospital encounter of 08/29/17  Urine culture     Status: None   Collection Time: 08/29/17  7:04 AM  Result Value Ref Range Status   Specimen Description   Final    URINE, CATHETERIZED Performed at [facility], [address], [place], [place] [address]    Special Requests   Final    Normal Performed at [facility], [address], [place], [place] [address]    Culture   Final    NO GROWTH Performed at [facility], [place] [address], [place], [place] [address]    Report Status 08/30/2017 FINAL  Final  Body fluid culture     Status: None   Collection Time: 08/29/17  8:00 PM  Result Value Ref Range Status   Specimen Description   Final    SYNOVIAL RIGHT KNEE Performed at [facility], [address] [place], [place] [address]    Special Requests   Final    NONE Performed at [facility], [address] [place], [place] [address]    Gram Stain   Final  FEW WBC PRESENT, PREDOMINANTLY PMN NO ORGANISMS SEEN    Culture   Final    No growth aerobically or anaerobically. Performed at [facility], [place] [address], [place], [place] [address]    Report Status 09/02/2017 FINAL  Final    Coagulation Studies: No results for input(s): LABPROT, INR in the last 72 hours.  Urinalysis: Recent Labs    04/16/18 1815  COLORURINE YELLOW*  LABSPEC 1.009  PHURINE 5.0  GLUCOSEU NEGATIVE  HGBUR SMALL*  BILIRUBINUR NEGATIVE  KETONESUR NEGATIVE  PROTEINUR NEGATIVE  NITRITE NEGATIVE  LEUKOCYTESUR NEGATIVE      Imaging: Dg Chest 2 View  Result Date: 04/16/2018 CLINICAL DATA:  Wheezing. EXAM: CHEST - 2 VIEW COMPARISON:  Radiograph of August 29, 2017.  FINDINGS: Stable cardiomegaly. No pneumothorax is noted. Status post coronary bypass graft. Mild bibasilar subsegmental atelectasis is noted. No significant pleural effusion is noted. Bony thorax is unremarkable. IMPRESSION: Mild bibasilar subsegmental atelectasis is noted. Stable cardiomegaly with central pulmonary vascular congestion. Electronically Signed   By: Marijo Conception, M.D.   On: 04/16/2018 15:31   US Renal  Result Date: 04/17/2018 CLINICAL DATA:  Acute renal failure, history hypertension, diabetes mellitus, coronary artery disease EXAM: RENAL / URINARY TRACT ULTRASOUND COMPLETE COMPARISON:  08/29/2017 FINDINGS: Right Kidney: Renal measurements: 10.3 x 4.3 x 4.9 cm = volume: 112 mL. Mild age-related renal cortical atrophy. Mildly lobulated renal contours unchanged. Cortical echogenicity normal. No mass, hydronephrosis or shadowing calcification. Left Kidney: Renal measurements: 12.2 x 6.4 x 6.0 cm = volume: 243 mL. Minimal cortical thinning. Normal cortical echogenicity. No mass, hydronephrosis or shadowing calcification. Bladder: Appears normal for degree of bladder distention. IMPRESSION: Mild age-related renal cortical thinning. Otherwise normal exam. Electronically Signed   By: Lavonia Dana M.D.   On: 04/17/2018 11:07   US Venous Img Lower Bilateral  Result Date: 04/16/2018 CLINICAL DATA:  Asymmetric leg swelling for 1 week EXAM: BILATERAL LOWER EXTREMITY VENOUS DUPLEX ULTRASOUND TECHNIQUE: Doppler venous assessment of the bilateral lower extremity deep venous system was performed, including characterization of spectral flow, compressibility, and plasticity. COMPARISON:  None. FINDINGS: There is complete compressibility of the bilateral common femoral, femoral, and popliteal veins. Doppler analysis demonstrates respiratory phasicity and augmentation of flow with calf compression. No obvious superficial vein or calf vein thrombosis. IMPRESSION: No evidence of lower extremity DVT.  Electronically Signed   By: Marybelle Killings M.D.   On: 04/16/2018 16:50     Medications:    . aspirin  325 mg Oral Daily  . atorvastatin  40 mg Oral QHS  . carvedilol  25 mg Oral BID WC  . docusate sodium  100 mg Oral BID  . febuxostat  40 mg Oral Daily  . [START ON 04/18/2018] fentaNYL  50 mcg Transdermal Q72H  . furosemide  40 mg Intravenous Q12H  . gabapentin  300 mg Oral BID  . heparin  5,000 Units Subcutaneous Q8H  . hydrALAZINE  25 mg Oral TID  . insulin aspart  0-15 Units Subcutaneous TID WC  . insulin aspart  0-5 Units Subcutaneous QHS  . insulin glargine  30 Units Subcutaneous QHS  . multivitamin with minerals  1 tablet Oral Daily  . pantoprazole  40 mg Oral Daily  . pentoxifylline  400 mg Oral TID WC  . potassium chloride SA  20 mEq Oral BID  . senna-docusate  1 tablet Oral BID   acetaminophen **OR** acetaminophen, ipratropium-albuterol, ondansetron **OR** ondansetron (ZOFRAN) IV, oxyCODONE, zolpidem  Assessment/ Plan:  72 y.o. male with a PMHx ofcoronary artery disease status post CABG, diabetes mellitus type 2, GERD, hyperlipidemia, hypertension, peripheral neuropathy, history of CVA, chronic LE edema/lymphedema presents with diastolic heart failure now.   1.  Acute renal failure/chronic kidney disease stage III baseline creatinine 1.4.  Suspect that cardiorenal syndrome may be playing some role at the moment.  Continue with furosemide 40 mg IV every 12 hours as renal function is improving on this regimen.  Renal ultrasound reviewed.  No obstruction.  Continue to monitor renal parameters daily.  2.  Lower extremity edema.  Patient follows at the wound care center.  He has Unna boots on.  Continue Lasix as above.  3.  Acute on chronic diastolic heart failure.  Counseled patient on salt and water restriction.  He has had dietary indiscretion.  Continue Lasix as above.   LOS: 1 Dorin Stooksbury 11/13/20192:02 PM

## 2018-04-17 NOTE — Evaluation (Signed)
Physical Therapy Evaluation Patient Details Name: Adam Merritt MRN: [id number] DOB: 09-24-1945 Today's Date: 04/17/2018   History of Present Illness  Pt is a 72 y.o. male presenting to hospital 04/16/18 with cough, SOB, LE swelling (L>R), and recent falls.  Pt admitted with acute on chronic diastolic CHF exacerbation, acute renal failure on CKD, chronic LE edema/lymphedema, obesity, chronic venous stasis, chronic bronchitis, CAD, CHF with preserved EF, htn, DM, stroke, CABG.  CPAP at night.  B LE's (-) DVT.  Clinical Impression  Prior to hospital admission, pt was limited with household ambulation with 3 falls in last week and had obtained manual w/c to improve safety with mobility about 1 day prior to hospital admission.  Pt lives with his wife in 1 level home with ramp to enter.  Currently pt is modified independent semi-supine to sit; CGA with transfers with RW; and CGA ambulating 10 feet with RW (limited distance ambulating d/t generalized weakness and fatigue; increased L>R LE edema noted which complicates mobility).  Overall steady with RW use.  No c/o pain.  Pt would benefit from skilled PT to address noted impairments and functional limitations (see below for any additional details).  Upon hospital discharge, recommend pt discharge to home with HHPT and support of family.  Anticipate pt may need to be w/c level initially upon hospital discharge for safe functional mobility and then progress back to household ambulation distances with assist of HHPT.    Follow Up Recommendations Home health PT;Supervision for mobility/OOB    Equipment Recommendations  Rolling walker with 5" wheels    Recommendations for Other Services OT consult     Precautions / Restrictions Precautions Precautions: Fall Precaution Comments: B LE Unna boots Restrictions Weight Bearing Restrictions: No      Mobility  Bed Mobility Overal bed mobility: Modified Independent             General bed mobility  comments: Semi-supine to sit with mild increased effort and time.  Transfers Overall transfer level: Needs assistance Equipment used: Rolling walker (2 wheeled) Transfers: Sit to/from [facility] Sit to Stand: Min guard Stand pivot transfers: Min guard       General transfer comment: increased effort to stand on own from bed (less effort to stand from recliner) but steady with RW use  Ambulation/Gait Ambulation/Gait assistance: Min guard Gait Distance (Feet): 10 Feet Assistive device: Rolling walker (2 wheeled)   Gait velocity: decreased   General Gait Details: decreased B step length/foot clearance/heelstrike; steady with RW use  Stairs            Wheelchair Mobility    Modified Rankin (Stroke Patients Only)       Balance Overall balance assessment: Needs assistance Sitting-balance support: No upper extremity supported;Feet supported Sitting balance-Leahy Scale: Normal Sitting balance - Comments: steady sitting reaching outside BOS   Standing balance support: Single extremity supported Standing balance-Leahy Scale: Poor Standing balance comment: pt requires at least single UE support for static standing balance                             Pertinent Vitals/Pain Pain Assessment: No/denies pain  Vitals (HR and O2 on room air) stable and WFL throughout treatment session.    Home Living Family/patient expects to be discharged to:: Private residence Living Arrangements: Spouse/significant other Available Help at Discharge: Family;Available 24 hours/day Type of Home: House Home Access: Ramped entrance     Home Layout: One level  Home Equipment: [place] - 4 wheels;Cane - single point;Wheelchair - manual;Grab bars - tub/shower;Shower seat - built in;Shower seat      Prior Function Level of Independence: Independent with assistive device(s)         Comments: Pt uses SPC outside and 4ww inside but has had increased difficulty  walking last 3 weeks (been holding onto walls and furniture for support with limited household distances; 3 falls in last week).  Obtained manual w/c and used for 1 day prior to hospital admission.     Hand Dominance        Extremity/Trunk Assessment   Upper Extremity Assessment Upper Extremity Assessment: Generalized weakness    Lower Extremity Assessment Lower Extremity Assessment: RLE deficits/detail;LLE deficits/detail RLE Deficits / Details: at least 3/5 hip flexion, knee flexion/extension, and DF LLE Deficits / Details: at least 3/5 hip flexion, knee flexion/extension, and DF (although increased effort to move compared to R LE)    Cervical / Trunk Assessment Cervical / Trunk Assessment: Normal  Communication   Communication: HOH  Cognition Arousal/Alertness: Awake/alert Behavior During Therapy: WFL for tasks assessed/performed Overall Cognitive Status: Within Functional Limits for tasks assessed                                 General Comments: Talkative; likes to joke around      General Comments General comments (skin integrity, edema, etc.): L>R LE edema noted; B Unna boots in place    Exercises   Nursing cleared pt for participation in physical therapy.  Pt agreeable to PT session.  Pt's family left beginning of session.   Assessment/Plan    PT Assessment Patient needs continued PT services  PT Problem List Decreased strength;Decreased range of motion;Decreased activity tolerance;Decreased balance;Decreased mobility;Decreased knowledge of use of DME;Decreased knowledge of precautions;Decreased skin integrity       PT Treatment Interventions DME instruction;Gait training;Functional mobility training;Therapeutic activities;Therapeutic exercise;Balance training;Patient/family education    PT Goals (Current goals can be found in the Care Plan section)  Acute Rehab PT Goals Patient Stated Goal: to go home tomorrow PT Goal Formulation: With  patient Time For Goal Achievement: 05/01/18 Potential to Achieve Goals: Fair    Frequency Min 2X/week   Barriers to discharge        Co-evaluation               AM-PAC PT "6 Clicks" Daily Activity  Outcome Measure Difficulty turning over in bed (including adjusting bedclothes, sheets and blankets)?: A Little Difficulty moving from lying on back to sitting on the side of the bed? : A Lot Difficulty sitting down on and standing up from a chair with arms (e.g., wheelchair, bedside commode, etc,.)?: Unable Help needed moving to and from a bed to chair (including a wheelchair)?: A Little Help needed walking in hospital room?: A Little Help needed climbing 3-5 steps with a railing? : Total 6 Click Score: 13    End of Session Equipment Utilized During Treatment: Gait belt Activity Tolerance: Patient tolerated treatment well Patient left: in chair;with call bell/phone within reach;with chair alarm set;with nursing/sitter in room(nursing adjusting pt's telemetry leads and reporting she would finish setting up pt when she was done) Nurse Communication: Mobility status;Precautions PT Visit Diagnosis: Other abnormalities of gait and mobility (R26.89);Muscle weakness (generalized) (M62.81);Repeated falls (R29.6);History of falling (Z91.81);Difficulty in walking, not elsewhere classified (R26.2)    Time: 8315-1761 PT Time Calculation (min) (ACUTE ONLY): 32 min  Charges:   PT Evaluation $PT Eval Low Complexity: 1 Low PT Treatments $Therapeutic Activity: 8-22 mins       Leitha Bleak, PT 04/17/18, 5:25 PM [phone]

## 2018-04-17 NOTE — Progress Notes (Deleted)
Per report this am from night RN, patietn without urine output.  Patient states no discomfort or feeling of fullness.  Patient states he has not had a lot of intake.  Bladder scan done and found to have 154ml of urine in bladder.  Will make MD aware.

## 2018-04-17 NOTE — Progress Notes (Signed)
Patient TED and SCD not applied.  Patient with UNA boots on BLLE for wound management.  Patient on Heparin subcutaneous Q8 hours for prophylaxis.

## 2018-04-17 NOTE — Progress Notes (Signed)
Adam Merritt NAME: Adam Merritt    MR#:  [id number]  DATE OF BIRTH:  [birth year]  SUBJECTIVE:  CHIEF COMPLAINT:   Chief Complaint  Patient presents with  . Shortness of Breath  . Leg Swelling  says he doesn't know why he's here but wound care clinic sent him to get diuresis and he wants to leave before tomorrow's [place] game REVIEW OF SYSTEMS:  Review of Systems  Constitutional: Negative for diaphoresis, fever, malaise/fatigue and weight loss.  HENT: Negative for ear discharge, ear pain, hearing loss, nosebleeds, sore throat and tinnitus.   Eyes: Negative for blurred vision and pain.  Respiratory: Negative for cough, hemoptysis, shortness of breath and wheezing.   Cardiovascular: Positive for leg swelling. Negative for chest pain, palpitations and orthopnea.  Gastrointestinal: Negative for abdominal pain, blood in stool, constipation, diarrhea, heartburn, nausea and vomiting.  Genitourinary: Negative for dysuria, frequency and urgency.  Musculoskeletal: Negative for back pain and myalgias.  Skin: Negative for itching and rash.  Neurological: Negative for dizziness, tingling, tremors, focal weakness, seizures, weakness and headaches.  Psychiatric/Behavioral: Negative for depression. The patient is not nervous/anxious.    DRUG ALLERGIES:  No Known Allergies VITALS:  Blood pressure 132/60, pulse 76, temperature 97.7 F (36.5 C), temperature source Oral, resp. rate 18, height 5\' 6"  (1.676 m), weight (!) 146.2 kg, SpO2 95 %. PHYSICAL EXAMINATION:  Physical Exam  Constitutional: He is oriented to person, place, and time.  HENT:  Head: Normocephalic and atraumatic.  Eyes: Pupils are equal, round, and reactive to light. Conjunctivae and EOM are normal.  Neck: Normal range of motion. Neck supple. No tracheal deviation present. No thyromegaly present.  Cardiovascular: Normal rate, regular rhythm and normal heart sounds.    Pulmonary/Chest: Effort normal and breath sounds normal. No respiratory distress. He has no wheezes. He exhibits no tenderness.  Abdominal: Soft. Bowel sounds are normal. He exhibits no distension. There is no tenderness.  Musculoskeletal: Normal range of motion.  Bilateral lower extremity lymphedema, left worse than right, superficial abrasions with raw areas noted on left leg - has ace-bandage wrapped  Neurological: He is alert and oriented to person, place, and time. No cranial nerve deficit.  Skin: Skin is warm and dry. No rash noted.   LABORATORY PANEL:  Male CBC Recent Labs  Lab 04/17/18 0542  WBC 10.1  HGB 12.7*  HCT 36.4*  PLT 153   ------------------------------------------------------------------------------------------------------------------ Chemistries  Recent Labs  Lab 04/17/18 0542  NA 137  K 3.5  CL 97*  CO2 29  GLUCOSE 263*  BUN 39*  CREATININE 1.89*  CALCIUM 9.0   RADIOLOGY:  Dg Chest 2 View  Result Date: 04/16/2018 CLINICAL DATA:  Wheezing. EXAM: CHEST - 2 VIEW COMPARISON:  Radiograph of August 29, 2017. FINDINGS: Stable cardiomegaly. No pneumothorax is noted. Status post coronary bypass graft. Mild bibasilar subsegmental atelectasis is noted. No significant pleural effusion is noted. Bony thorax is unremarkable. IMPRESSION: Mild bibasilar subsegmental atelectasis is noted. Stable cardiomegaly with central pulmonary vascular congestion. Electronically Signed   By: Marijo Conception, M.D.   On: 04/16/2018 15:31   US Venous Img Lower Bilateral  Result Date: 04/16/2018 CLINICAL DATA:  Asymmetric leg swelling for 1 week EXAM: BILATERAL LOWER EXTREMITY VENOUS DUPLEX ULTRASOUND TECHNIQUE: Doppler venous assessment of the bilateral lower extremity deep venous system was performed, including characterization of spectral flow, compressibility, and plasticity. COMPARISON:  None. FINDINGS: There is complete compressibility of the bilateral common femoral,  femoral, and  popliteal veins. Doppler analysis demonstrates respiratory phasicity and augmentation of flow with calf compression. No obvious superficial vein or calf vein thrombosis. IMPRESSION: No evidence of lower extremity DVT. Electronically Signed   By: Marybelle Killings M.D.   On: 04/16/2018 16:50   ASSESSMENT AND PLAN:  Adam Merritt  is a 72 y.o. male with a known history of CAD status post CABG, diastolic CHF, diabetes mellitus, tonic lymphedema, diabetic neuropathy, history of stroke, anemia of chronic disease admitted from wound care clinic secondary to worsening weakness and dyspnea.  1.  Acute on chronic diastolic CHF exacerbation-increased weight gain. - daily weights, strict input and output monitoring -Noncompliant with diet as eats outside food every day. -Hold metolazone, continue IV Lasix for now. -Cardiology consult pending -Repeat echocardiogram.  Last echo from last year showing EF of 60 to 65%  2.  Acute renal failure on CKD stage III-Baseline creatinine seems to be around 1.4.  However most recent labs from [facility] hospital are not available, and that have showed worsening of his renal function according to PCP notes. -pending renal ultrasound.  -Nephrology has been consulted -Avoid nephrotoxins  3.  Chronic lower extremity edema/lymphedema-follows with vascular and also wound care clinic. -Continue dressing changes as recommended.  Left lower extremity is always larger than the right, Dopplers negative for DVT - appreciate wound care nurse input  4.  Diabetes mellitus-continue Victoza, Lantus and sliding scale insulin  5.  Obstructive sleep apnea-continue CPAP  6.  Chronic low back pain-continue fentanyl patch.  Also pain medications as needed     All the records are reviewed and case discussed with Care Management/Social Worker. Management plans discussed with the patient, nursing and they are in agreement.  CODE STATUS: Full Code  TOTAL TIME TAKING CARE OF THIS PATIENT: 35  minutes.   More than 50% of the time was spent in counseling/coordination of care: YES  POSSIBLE D/C IN 1 DAYS, DEPENDING ON CLINICAL CONDITION.   Adam Merritt M.D on 04/17/2018 at 10:26 AM  Between 7am to 6pm - Pager - [phone]  After 6pm go to [email or web address] - password [email or web address] [facility]  [facility] [place] [facility]  Office  [phone]  CC: Primary care physician; [name], MD  Note: This dictation was prepared with Dragon dictation along with smaller phrase technology. Any transcriptional errors that result from this process are unintentional.

## 2018-04-18 DIAGNOSIS — I130 Hypertensive heart and chronic kidney disease with heart failure and stage 1 through stage 4 chronic kidney disease, or unspecified chronic kidney disease: Secondary | ICD-10-CM | POA: Diagnosis not present

## 2018-04-18 DIAGNOSIS — J810 Acute pulmonary edema: Secondary | ICD-10-CM | POA: Diagnosis not present

## 2018-04-18 LAB — CBC
HCT: 34.9 % — ABNORMAL LOW (ref 39.0–52.0)
Hemoglobin: 11.9 g/dL — ABNORMAL LOW (ref 13.0–17.0)
MCH: 32.4 pg (ref 26.0–34.0)
MCHC: 34.1 g/dL (ref 30.0–36.0)
MCV: 95.1 fL (ref 80.0–100.0)
NRBC: 0 % (ref 0.0–0.2)
PLATELETS: 141 10*3/uL — AB (ref 150–400)
RBC: 3.67 MIL/uL — AB (ref 4.22–5.81)
RDW: 12.3 % (ref 11.5–15.5)
WBC: 12.3 10*3/uL — AB (ref 4.0–10.5)

## 2018-04-18 LAB — BASIC METABOLIC PANEL
Anion gap: 11 (ref 5–15)
BUN: 45 mg/dL — AB (ref 8–23)
CO2: 34 mmol/L — ABNORMAL HIGH (ref 22–32)
Calcium: 8.7 mg/dL — ABNORMAL LOW (ref 8.9–10.3)
Chloride: 93 mmol/L — ABNORMAL LOW (ref 98–111)
Creatinine, Ser: 1.93 mg/dL — ABNORMAL HIGH (ref 0.61–1.24)
GFR, EST AFRICAN AMERICAN: 38 mL/min — AB (ref >60)
GFR, EST NON AFRICAN AMERICAN: 33 mL/min — AB (ref >60)
Glucose, Bld: 155 mg/dL — ABNORMAL HIGH (ref 70–99)
POTASSIUM: 2.9 mmol/L — AB (ref 3.5–5.1)
SODIUM: 138 mmol/L (ref 135–145)

## 2018-04-18 LAB — GLUCOSE, CAPILLARY
GLUCOSE-CAPILLARY: 151 mg/dL — AB (ref 70–99)
Glucose-Capillary: 182 mg/dL — ABNORMAL HIGH (ref 70–99)

## 2018-04-18 LAB — MAGNESIUM: MAGNESIUM: 2.2 mg/dL (ref 1.7–2.4)

## 2018-04-18 MED ORDER — SODIUM CHLORIDE 0.9% FLUSH
3.0000 mL | Freq: Two times a day (BID) | INTRAVENOUS | Status: DC
  Administered 2018-04-18: 3 mL via INTRAVENOUS

## 2018-04-18 MED ORDER — POTASSIUM CHLORIDE 10 MEQ/100ML IV SOLN
10.0000 meq | INTRAVENOUS | Status: DC
  Administered 2018-04-18: 10 meq via INTRAVENOUS
  Filled 2018-04-18 (×4): qty 100

## 2018-04-18 MED ORDER — SODIUM CHLORIDE 0.9% FLUSH: 3.0000 mL | INTRAVENOUS | Status: DC | PRN

## 2018-04-18 MED ORDER — POTASSIUM CHLORIDE 20 MEQ PO PACK: 20.0000 meq | PACK | Freq: Two times a day (BID) | ORAL | 0 refills | Status: DC

## 2018-04-18 MED ORDER — FUROSEMIDE 40 MG PO TABS: 40.0000 mg | ORAL_TABLET | Freq: Every day | ORAL | 0 refills | Status: DC

## 2018-04-18 MED ORDER — SODIUM CHLORIDE 0.9 % IV SOLN
INTRAVENOUS | Status: DC | PRN
  Administered 2018-04-18: 500 mL via INTRAVENOUS

## 2018-04-18 MED ORDER — POTASSIUM CHLORIDE CRYS ER 20 MEQ PO TBCR
40.0000 meq | EXTENDED_RELEASE_TABLET | Freq: Once | ORAL | Status: AC
  Administered 2018-04-18: 40 meq via ORAL
  Filled 2018-04-18: qty 2

## 2018-04-18 NOTE — Care Management Note (Signed)
Case Management Note  Patient Details  Name: RAHEEN CAPILI MRN: [id number] Date of Birth: [birth year]  Subjective/Objective:      Patient offered choice for home health services.  He has used [facility] in the past and would like to use them again.  Order signed and faxed for hearth failure protocol along with RN, PT, OT and aide.  Made [facility] referral as well.  Plans to discharge later today.                Action/Plan:   Expected Discharge Date:  04/18/18               Expected Discharge Plan:     In-House Referral:  [facility]  Discharge planning Services  CM Consult  Post Acute Care Choice:  Home Health Choice offered to:  Patient  DME Arranged:    DME Agency:     HH Arranged:  RN, PT, OT, Nurse's Aide(heart failure protocol) [place] Agency:  [facility]  Status of Service:  Completed, signed off  If discussed at [place] of Stay Meetings, dates discussed:    Additional Comments:  Elza Rafter, RN 04/18/2018, 10:56 AM

## 2018-04-18 NOTE — Discharge Instructions (Signed)
Heart Failure °Heart failure means your heart has trouble pumping blood. This makes it hard for your body to work well. Heart failure is usually a long-term (chronic) condition. You must take good care of yourself and follow your doctor's treatment plan. °Follow these instructions at home: °· Take your heart medicine as told by your doctor. °? Do not stop taking medicine unless your doctor tells you to. °? Do not skip any dose of medicine. °? Refill your medicines before they run out. °? Take other medicines only as told by your doctor or pharmacist. °· Stay active if told by your doctor. The elderly and people with severe heart failure should talk with a doctor about physical activity. °· Eat heart-healthy foods. Choose foods that are without trans fat and are low in saturated fat, cholesterol, and salt (sodium). This includes fresh or frozen fruits and vegetables, fish, lean meats, fat-free or low-fat dairy foods, whole grains, and high-fiber foods. Lentils and dried peas and beans (legumes) are also good choices. °· Limit salt if told by your doctor. °· Cook in a healthy way. Roast, grill, broil, bake, poach, steam, or stir-fry foods. °· Limit fluids as told by your doctor. °· Weigh yourself every morning. Do this after you pee (urinate) and before you eat breakfast. Write down your weight to give to your doctor. °· Take your blood pressure and write it down if your doctor tells you to. °· Ask your doctor how to check your pulse. Check your pulse as told. °· Lose weight if told by your doctor. °· Stop smoking or chewing tobacco. Do not use gum or patches that help you quit without your doctor's approval. °· Schedule and go to doctor visits as told. °· Nonpregnant women should have no more than 1 drink a day. Men should have no more than 2 drinks a day. Talk to your doctor about drinking alcohol. °· Stop illegal drug use. °· Stay current with shots (immunizations). °· Manage your health conditions as told by your  doctor. °· Learn to manage your stress. °· Rest when you are tired. °· If it is really hot outside: °? Avoid intense activities. °? Use air conditioning or fans, or get in a cooler place. °? Avoid caffeine and alcohol. °? Wear loose-fitting, lightweight, and light-colored clothing. °· If it is really cold outside: °? Avoid intense activities. °? Layer your clothing. °? Wear mittens or gloves, a hat, and a scarf when going outside. °? Avoid alcohol. °· Learn about heart failure and get support as needed. °· Get help to maintain or improve your quality of life and your ability to care for yourself as needed. °Contact a doctor if: °· You gain weight quickly. °· You are more short of breath than usual. °· You cannot do your normal activities. °· You tire easily. °· You cough more than normal, especially with activity. °· You have any or more puffiness (swelling) in areas such as your hands, feet, ankles, or belly (abdomen). °· You cannot sleep because it is hard to breathe. °· You feel like your heart is beating fast (palpitations). °· You get dizzy or light-headed when you stand up. °Get help right away if: °· You have trouble breathing. °· There is a change in mental status, such as becoming less alert or not being able to focus. °· You have chest pain or discomfort. °· You faint. °This information is not intended to replace advice given to you by your health care provider. Make sure you   discuss any questions you have with your health care provider. °Document Released: 02/29/2008 Document Revised: 10/28/2015 Document Reviewed: 07/08/2012 °Elsevier Interactive Patient Education © 2017 [employer] ° °

## 2018-04-18 NOTE — Plan of Care (Signed)
Nutrition Education Note  RD consulted for nutrition education regarding CHF.  Met with pt in room today. Pt reports good appetite and oral intake at baseline. Pt reports that he does not use any salt at home but pt reports eating 100% of his meals outside of the home. Pt reports that he and his wife eat out for all 3 meals. RD discussed with patient the large amount of sodium added to restaurant foods. Discussed ways to decrease sodium intake at restaurants by making special meal requests and eliminating sauces, seasonings and marinades.    RD provided "Low Sodium Nutrition Therapy" handout from the Academy of [employer]etics. Reviewed patient's dietary recall. Provided examples on ways to decrease sodium intake in diet. Discouraged intake of processed foods and use of salt shaker. Encouraged fresh fruits and vegetables as well as whole grain sources of carbohydrates to maximize fiber intake.   RD discussed why it is important for patient to adhere to diet recommendations, and emphasized the role of fluids, foods to avoid, and importance of weighing self daily. Teach back method used.  Expect poor compliance.  Body mass index is 46.45 kg/m. Pt meets criteria for morbid obesity based on current BMI.  Current diet order is HH, patient is consuming approximately 100% of meals at this time. Labs and medications reviewed. No further nutrition interventions warranted at this time. RD contact information provided. If additional nutrition issues arise, please re-consult RD.   Koleen Distance MS, RD, LDN Pager #- [phone] Office#- [phone] After Hours Pager: [phone]

## 2018-04-18 NOTE — Consult Note (Signed)
Reason for Consult: Congestive heart failure bilateral leg edema Referring Physician: Dr. Tressia Miners hospitalist, [name] primary Dr. Nehemiah Massed cardiologist  Adam Merritt is an 72 y.o. male.  HPI: Patient is a 72 year old morbidly obese white male known coronary disease known coronary bypass surgery chronic diastolic congestive heart failure diabetes renal insufficiency bilateral lymphedema lower extremities with venous stasis ulcers diabetic neuropathy CVA anemia chronic low back pain presented with worsening leg edema and 20 pound weight gain.  Patient's been seen by wound care as well as home health nurse for wrapping of his legs he has a history of obstructive sleep apnea is on CPAP.  Patient states he is fallen a couple times over the last few days he has had worsening leg edema even on his feet has been wrapped he has had some congestion shortness of breath denies any chest pain when she was seen by the home health nurse he was referred to the hospital for evaluation for possible worsening congestive heart failure  Past Medical History:  Diagnosis Date  . Anemia   . CAD (coronary artery disease)   . Chest pain   . Coronary artery disease   . Diabetes mellitus without complication ([place])   . Diastolic CHF ([place])   . Esophageal reflux   . Herpes zoster without mention of complication   . Hyperlipidemia   . Hypertension   . Insomnia   . Lumbago   . Lymphedema   . Neuropathy in diabetes ([place])   . Obstructive chronic bronchitis without exacerbation ([place])   . Other malaise and fatigue   . Stroke ([place])   . Varicose veins     Past Surgical History:  Procedure Laterality Date  . CHOLECYSTECTOMY    . CORONARY ARTERY BYPASS GRAFT      Family History  Problem Relation Age of Onset  . Diabetes Mother   . Hyperlipidemia Mother   . Hypertension Mother   . Diabetes Father   . Hyperlipidemia Father   . Hypertension Father   . CAD Father     Social History:  reports that he has  never smoked. He has never used smokeless tobacco. He reports that he does not drink alcohol or use drugs.  Allergies: No Known Allergies  Medications: I have reviewed the patient's current medications.  Results for orders placed or performed during the hospital encounter of 04/16/18 (from the past 48 hour(s))  Basic metabolic panel     Status: Abnormal   Collection Time: 04/16/18  3:36 PM  Result Value Ref Range   Sodium 137 135 - 145 mmol/L   Potassium 3.3 (L) 3.5 - 5.1 mmol/L   Chloride 98 98 - 111 mmol/L   CO2 29 22 - 32 mmol/L   Glucose, Bld 163 (H) 70 - 99 mg/dL   BUN 37 (H) 8 - 23 mg/dL   Creatinine, Ser 2.20 (H) 0.61 - 1.24 mg/dL   Calcium 9.0 8.9 - 10.3 mg/dL   GFR calc non Af Amer 28 (L) >60 mL/min   GFR calc Af Amer 32 (L) >60 mL/min    Comment: (NOTE) The eGFR has been calculated using the CKD EPI equation. This calculation has not been validated in all clinical situations. eGFR's persistently <60 mL/min signify possible Chronic Kidney Disease.    Anion gap 10 5 - 15    Comment: Performed at [facility], [address] [place], [place] [address]  Troponin I - ONCE - STAT     Status: None  Collection Time: 04/16/18  3:36 PM  Result Value Ref Range   Troponin I <0.03 <0.03 ng/mL    Comment: Performed at [facility], [address] [place], [place] [address]  Brain natriuretic peptide     Status: None   Collection Time: 04/16/18  3:36 PM  Result Value Ref Range   B Natriuretic Peptide 29.0 0.0 - 100.0 pg/mL    Comment: Performed at [facility], [address] [place], [place] [address]  CBC with Differential/Platelet     Status: Abnormal   Collection Time: 04/16/18  3:36 PM  Result Value Ref Range   WBC 11.2 (H) 4.0 - 10.5 K/uL   RBC 3.93 (L) 4.22 - 5.81 MIL/uL   Hemoglobin 12.7 (L) 13.0 - 17.0 g/dL   HCT 37.9 (L) 39.0 - 52.0 %   MCV 96.4 80.0 - 100.0 fL   MCH 32.3 26.0 - 34.0 pg   MCHC 33.5 30.0 - 36.0 g/dL   RDW 12.7 11.5  - 15.5 %   Platelets 151 150 - 400 K/uL   nRBC 0.0 0.0 - 0.2 %   Neutrophils Relative % 78 %   Neutro Abs 8.8 (H) 1.7 - 7.7 K/uL   Lymphocytes Relative 12 %   Lymphs Abs 1.3 0.7 - 4.0 K/uL   Monocytes Relative 6 %   Monocytes Absolute 0.6 0.1 - 1.0 K/uL   Eosinophils Relative 3 %   Eosinophils Absolute 0.3 0.0 - 0.5 K/uL   Basophils Relative 0 %   Basophils Absolute 0.1 0.0 - 0.1 K/uL   Immature Granulocytes 1 %   Abs Immature Granulocytes 0.07 0.00 - 0.07 K/uL    Comment: Performed at [facility], [address] [place], [place] [address]  Urinalysis, Complete w Microscopic     Status: Abnormal   Collection Time: 04/16/18  6:15 PM  Result Value Ref Range   Color, Urine YELLOW (A) YELLOW   APPearance CLEAR (A) CLEAR   Specific Gravity, Urine 1.009 1.005 - 1.030   pH 5.0 5.0 - 8.0   Glucose, UA NEGATIVE NEGATIVE mg/dL   Hgb urine dipstick SMALL (A) NEGATIVE   Bilirubin Urine NEGATIVE NEGATIVE   Ketones, ur NEGATIVE NEGATIVE mg/dL   Protein, ur NEGATIVE NEGATIVE mg/dL   Nitrite NEGATIVE NEGATIVE   Leukocytes, UA NEGATIVE NEGATIVE   WBC, UA 0-5 0 - 5 WBC/hpf   Bacteria, UA RARE (A) NONE SEEN   Squamous Epithelial / LPF NONE SEEN 0 - 5   Mucus PRESENT    Hyaline Casts, UA PRESENT     Comment: Performed at [facility], [address] [place], [place] [address]  Troponin I - Now Then Q6H     Status: None   Collection Time: 04/16/18  7:19 PM  Result Value Ref Range   Troponin I <0.03 <0.03 ng/mL    Comment: Performed at [facility], [address] [place], [place] [address]  Hemoglobin A1c     Status: Abnormal   Collection Time: 04/16/18  7:19 PM  Result Value Ref Range   Hgb A1c MFr Bld 6.7 (H) 4.8 - 5.6 %    Comment: (NOTE) Pre diabetes:          5.7%-6.4% Diabetes:              >6.4% Glycemic control for   <7.0% adults with diabetes    Mean Plasma Glucose 145.59 mg/dL    Comment: Performed at [facility], [address],  [place], [place] [address]  TSH     Status: None   Collection Time: 04/16/18  7:19 PM  Result Value Ref Range   TSH 1.978 0.350 - 4.500 uIU/mL    Comment: Performed by a 3rd Generation assay with a functional sensitivity of <=0.01 uIU/mL. Performed at [facility], [address] [place], [place] [address]   Glucose, capillary     Status: Abnormal   Collection Time: 04/16/18  9:24 PM  Result Value Ref Range   Glucose-Capillary 253 (H) 70 - 99 mg/dL  Troponin I - Now Then Q6H     Status: None   Collection Time: 04/16/18 11:35 PM  Result Value Ref Range   Troponin I <0.03 <0.03 ng/mL    Comment: Performed at [facility], [address] [place], [place] [address]  Troponin I - Now Then Q6H     Status: None   Collection Time: 04/17/18  5:42 AM  Result Value Ref Range   Troponin I <0.03 <0.03 ng/mL    Comment: Performed at [facility], [address] [place], [place] [address]  Basic metabolic panel     Status: Abnormal   Collection Time: 04/17/18  5:42 AM  Result Value Ref Range   Sodium 137 135 - 145 mmol/L   Potassium 3.5 3.5 - 5.1 mmol/L   Chloride 97 (L) 98 - 111 mmol/L   CO2 29 22 - 32 mmol/L   Glucose, Bld 263 (H) 70 - 99 mg/dL   BUN 39 (H) 8 - 23 mg/dL   Creatinine, Ser 1.89 (H) 0.61 - 1.24 mg/dL   Calcium 9.0 8.9 - 10.3 mg/dL   GFR calc non Af Amer 34 (L) >60 mL/min   GFR calc Af Amer 39 (L) >60 mL/min    Comment: (NOTE) The eGFR has been calculated using the CKD EPI equation. This calculation has not been validated in all clinical situations. eGFR's persistently <60 mL/min signify possible Chronic Kidney Disease.    Anion gap 11 5 - 15    Comment: Performed at [facility], [address] [place], [place] [address]  CBC     Status: Abnormal   Collection Time: 04/17/18  5:42 AM  Result Value Ref Range   WBC 10.1 4.0 - 10.5 K/uL   RBC 3.87 (L) 4.22 - 5.81 MIL/uL   Hemoglobin 12.7 (L) 13.0 - 17.0 g/dL   HCT 36.4 (L) 39.0 -  52.0 %   MCV 94.1 80.0 - 100.0 fL   MCH 32.8 26.0 - 34.0 pg   MCHC 34.9 30.0 - 36.0 g/dL   RDW 12.3 11.5 - 15.5 %   Platelets 153 150 - 400 K/uL   nRBC 0.0 0.0 - 0.2 %    Comment: Performed at [facility], [address] [place], [place] [address]  Glucose, capillary     Status: Abnormal   Collection Time: 04/17/18  8:02 AM  Result Value Ref Range   Glucose-Capillary 244 (H) 70 - 99 mg/dL  Glucose, capillary     Status: Abnormal   Collection Time: 04/17/18 11:49 AM  Result Value Ref Range   Glucose-Capillary 213 (H) 70 - 99 mg/dL  Glucose, capillary     Status: Abnormal   Collection Time: 04/17/18  4:51 PM  Result Value Ref Range   Glucose-Capillary 182 (H) 70 - 99 mg/dL  Glucose, capillary     Status: Abnormal   Collection Time: 04/17/18  9:06 PM  Result Value Ref Range  Glucose-Capillary 255 (H) 70 - 99 mg/dL   Comment 1 Notify RN    Comment 2 Document in Chart   CBC     Status: Abnormal   Collection Time: 04/18/18  3:24 AM  Result Value Ref Range   WBC 12.3 (H) 4.0 - 10.5 K/uL   RBC 3.67 (L) 4.22 - 5.81 MIL/uL   Hemoglobin 11.9 (L) 13.0 - 17.0 g/dL   HCT 34.9 (L) 39.0 - 52.0 %   MCV 95.1 80.0 - 100.0 fL   MCH 32.4 26.0 - 34.0 pg   MCHC 34.1 30.0 - 36.0 g/dL   RDW 12.3 11.5 - 15.5 %   Platelets 141 (L) 150 - 400 K/uL   nRBC 0.0 0.0 - 0.2 %    Comment: Performed at [facility], [address] [place], [place] [address]  Basic metabolic panel     Status: Abnormal   Collection Time: 04/18/18  3:24 AM  Result Value Ref Range   Sodium 138 135 - 145 mmol/L   Potassium 2.9 (L) 3.5 - 5.1 mmol/L   Chloride 93 (L) 98 - 111 mmol/L   CO2 34 (H) 22 - 32 mmol/L   Glucose, Bld 155 (H) 70 - 99 mg/dL   BUN 45 (H) 8 - 23 mg/dL   Creatinine, Ser 1.93 (H) 0.61 - 1.24 mg/dL   Calcium 8.7 (L) 8.9 - 10.3 mg/dL   GFR calc non Af Amer 33 (L) >60 mL/min   GFR calc Af Amer 38 (L) >60 mL/min    Comment: (NOTE) The eGFR has been calculated using the CKD EPI  equation. This calculation has not been validated in all clinical situations. eGFR's persistently <60 mL/min signify possible Chronic Kidney Disease.    Anion gap 11 5 - 15    Comment: Performed at [facility], [address] [place], [place] [address]  Glucose, capillary     Status: Abnormal   Collection Time: 04/18/18  7:59 AM  Result Value Ref Range   Glucose-Capillary 151 (H) 70 - 99 mg/dL   Comment 1 Notify RN     Dg Chest 2 View  Result Date: 04/16/2018 CLINICAL DATA:  Wheezing. EXAM: CHEST - 2 VIEW COMPARISON:  Radiograph of August 29, 2017. FINDINGS: Stable cardiomegaly. No pneumothorax is noted. Status post coronary bypass graft. Mild bibasilar subsegmental atelectasis is noted. No significant pleural effusion is noted. Bony thorax is unremarkable. IMPRESSION: Mild bibasilar subsegmental atelectasis is noted. Stable cardiomegaly with central pulmonary vascular congestion. Electronically Signed   By: Marijo Conception, M.D.   On: 04/16/2018 15:31   US Renal  Result Date: 04/17/2018 CLINICAL DATA:  Acute renal failure, history hypertension, diabetes mellitus, coronary artery disease EXAM: RENAL / URINARY TRACT ULTRASOUND COMPLETE COMPARISON:  08/29/2017 FINDINGS: Right Kidney: Renal measurements: 10.3 x 4.3 x 4.9 cm = volume: 112 mL. Mild age-related renal cortical atrophy. Mildly lobulated renal contours unchanged. Cortical echogenicity normal. No mass, hydronephrosis or shadowing calcification. Left Kidney: Renal measurements: 12.2 x 6.4 x 6.0 cm = volume: 243 mL. Minimal cortical thinning. Normal cortical echogenicity. No mass, hydronephrosis or shadowing calcification. Bladder: Appears normal for degree of bladder distention. IMPRESSION: Mild age-related renal cortical thinning. Otherwise normal exam. Electronically Signed   By: Lavonia Dana M.D.   On: 04/17/2018 11:07   US Venous Img Lower Bilateral  Result Date: 04/16/2018 CLINICAL DATA:  Asymmetric leg swelling for 1  week EXAM: BILATERAL LOWER EXTREMITY VENOUS DUPLEX ULTRASOUND TECHNIQUE: Doppler venous assessment of the bilateral  lower extremity deep venous system was performed, including characterization of spectral flow, compressibility, and plasticity. COMPARISON:  None. FINDINGS: There is complete compressibility of the bilateral common femoral, femoral, and popliteal veins. Doppler analysis demonstrates respiratory phasicity and augmentation of flow with calf compression. No obvious superficial vein or calf vein thrombosis. IMPRESSION: No evidence of lower extremity DVT. Electronically Signed   By: Marybelle Killings M.D.   On: 04/16/2018 16:50    Review of Systems  Constitutional: Positive for malaise/fatigue.  HENT: Positive for congestion.   Eyes: Negative.   Respiratory: Positive for cough and shortness of breath.   Cardiovascular: Positive for orthopnea, leg swelling and PND.  Gastrointestinal: Negative.   Genitourinary: Negative.   Musculoskeletal: Positive for joint pain.  Skin: Negative.   Neurological: Positive for dizziness and weakness.  Endo/Heme/Allergies: Negative.   Psychiatric/Behavioral: The patient is nervous/anxious.    Blood pressure (!) 132/56, pulse (!) 59, temperature 97.8 F (36.6 C), temperature source Oral, resp. rate 16, height '5\' 6"'  (1.676 m), weight 130.5 kg, SpO2 95 %. Physical Exam  Nursing note and vitals reviewed. Constitutional: He is oriented to person, place, and time. He appears well-developed and well-nourished.  HENT:  Head: Normocephalic and atraumatic.  Eyes: Pupils are equal, round, and reactive to light. Conjunctivae and EOM are normal.  Neck: Normal range of motion. Neck supple.  Cardiovascular: Normal rate, regular rhythm and normal heart sounds.  Respiratory: Effort normal and breath sounds normal.  GI: Soft. Bowel sounds are normal.  Musculoskeletal: He exhibits edema.  Neurological: He is alert and oriented to person, place, and time. He has normal  reflexes.  Skin: There is erythema.  Psychiatric: He has a normal mood and affect.    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 Plan Agree with admit follow-up EKGs troponins Correct electrolytes Continue to wrap legs for lymphedema Diuretic therapy for heart failure Consult nephrology for renal insufficiency Maintain CPAP for obstructive sleep apnea Continue diabetes management and control DVT prophylaxis Chronic pain management for lower back pain   Jerilyn Gillaspie D Baird Polinski 04/18/2018, 9:01 AM

## 2018-04-18 NOTE — Progress Notes (Signed)
Adam Merritt, Adam Merritt ([id number]) Visit Report for 04/16/2018 Arrival Information Details Patient Name: Adam Merritt, Adam Merritt Date of Service: 04/16/2018 12:45 PM Medical Record Number: [id number] Patient Account Number: [id number] Date of Birth/Sex: May 08, 1946 (72 y.o. M) Treating RN: Montey Hora Primary Care Elisama Thissen: [name] Other Clinician: Referring Donatello Kleve: [name] Treating Glanda Spanbauer/Extender: Melburn Hake, [name]: 1 Visit Information History Since Last Visit Added or deleted any medications: No Patient Arrived: Wheel Chair Any new allergies or adverse reactions: No Arrival Time: 12:52 Had a fall or experienced change in No Accompanied By: wife activities of daily living that may affect Transfer Assistance: None risk of falls: Patient Identification Verified: Yes Signs or symptoms of abuse/neglect since last visito No Secondary Verification Process Yes Hospitalized since last visit: No Completed: Implantable device outside of the clinic excluding No Patient Has Alerts: Yes cellular tissue based products placed in the center Patient Alerts: Patient on Blood since last visit: Thinner Has Dressing in Place as Prescribed: Yes Aspirin 325mg  Has Compression in Place as Prescribed: Yes Type II Diabetes ABI 5/19 L 1.1 R 0.9 Pain Present Now: No TBI L 0.6 R 0.6 Electronic Signature(s) Signed: 04/16/2018 5:03:03 PM By: Montey Hora Entered By: Montey Hora on 04/16/2018 12:52:57 Adam Merritt ([id number]) -------------------------------------------------------------------------------- Encounter Discharge Information Details Patient Name: Adam Merritt Date of Service: 04/16/2018 12:45 PM Medical Record Number: [id number] Patient Account Number: [id number] Date of Birth/Sex: May 04, 1946 (72 y.o. M) Treating RN: Montey Hora Primary Care Maclovio Henson: [name] Other Clinician: Referring Treasa Bradshaw: [name] Treating Roddrick Sharron/Extender: Melburn Hake,  [name]: 1 Encounter Discharge Information Items Discharge Condition: Stable Ambulatory Status: Stretcher Discharge Destination: Emergency Room Telephoned: No Orders Sent: Yes Transportation: Ambulance Accompanied By: EMS Schedule Follow-up Appointment: Yes Clinical Summary of Care: Electronic Signature(s) Signed: 04/16/2018 1:46:35 PM By: Montey Hora Entered By: Montey Hora on 04/16/2018 13:46:35 Adam Merritt ([id number]) -------------------------------------------------------------------------------- Lower Extremity Assessment Details Patient Name: Adam Merritt Date of Service: 04/16/2018 12:45 PM Medical Record Number: [id number] Patient Account Number: [id number] Date of Birth/Sex: 24-Jun-1945 (72 y.o. M) Treating RN: Montey Hora Primary Care Mariely Mahr: [name] Other Clinician: Referring Detrice Cales: [name] Treating Kellina Dreese/Extender: Melburn Hake, [name]: 1 Edema Assessment Assessed: [Left: No] [Right: No] [Left: Edema] [Right: :] Calf Left: Right: Point of Measurement: 32 cm From Medial Instep 61 cm 54.5 cm Ankle Left: Right: Point of Measurement: 11 cm From Medial Instep 38.5 cm 31.5 cm Vascular Assessment Pulses: Posterior Tibial Extremity colors, hair growth, and conditions: Extremity Color: [Left:Hyperpigmented] [Right:Hyperpigmented] Hair Growth on Extremity: [Left:No] [Right:No] Temperature of Extremity: [Left:Warm] [Right:Warm] Capillary Refill: [Left:< 3 seconds] [Right:< 3 seconds] Toe Nail Assessment Left: Right: Thick: Yes Yes Discolored: Yes Yes Deformed: Yes Yes Improper Length and Hygiene: No No Electronic Signature(s) Signed: 04/16/2018 5:03:03 PM By: Montey Hora Entered By: Montey Hora on 04/16/2018 13:02:56 Adam Merritt ([id number]) -------------------------------------------------------------------------------- Multi Wound Chart Details Patient Name: Adam Merritt Date of Service:  04/16/2018 12:45 PM Medical Record Number: [id number] Patient Account Number: [id number] Date of Birth/Sex: 20-Sep-1945 (72 y.o. M) Treating RN: Montey Hora Primary Care Oluwakemi Salsberry: [name] Other Clinician: Referring Amberle Lyter: [name] Treating Rudolph Daoust/Extender: Melburn Hake, [name]: 1 Vital Signs Height(in): 66 Pulse(bpm): 70 Weight(lbs): 323 Blood Pressure(mmHg): 158/67 Body Mass Index(BMI): 52 Temperature(F): 97.7 Respiratory Rate 20 (breaths/min): Wound Assessments Treatment Notes Electronic Signature(s) Signed: 04/16/2018 5:03:03 PM By: Montey Hora Entered By: Montey Hora on 04/16/2018 13:03:08 Adam Merritt ([id number]) -------------------------------------------------------------------------------- Landa  Details Patient Name: Adam Merritt, Adam Merritt Date of Service: 04/16/2018 12:45 PM Medical Record Number: [id number] Patient Account Number: [id number] Date of Birth/Sex: [birth year] (72 y.o. M) Treating RN: Montey Hora Primary Care Thamar Holik: [name] Other Clinician: Referring Daisi Kentner: [name] Treating Jasiah Elsen/Extender: Melburn Hake, [name]: 1 Active Inactive ` Abuse / Safety / Falls / Self Care Management Nursing Diagnoses: Potential for falls Goals: Patient will remain injury free related to falls Date Initiated: 04/09/2018 Target Resolution Date: 07/13/2018 Goal Status: Active Interventions: Assess fall risk on admission and as needed Notes: ` Orientation to the Wound Care Program Nursing Diagnoses: Knowledge deficit related to the wound healing center program Goals: Patient/caregiver will verbalize understanding of the [facility] Date Initiated: 04/09/2018 Target Resolution Date: 07/13/2018 Goal Status: Active Interventions: Provide education on orientation to the wound center Notes: ` Venous Leg Ulcer Nursing Diagnoses: Actual venous Insuffiency (use after  diagnosis is confirmed) Goals: Patient will maintain optimal edema control Date Initiated: 04/09/2018 Target Resolution Date: 07/13/2018 Goal Status: Active Interventions: Adam Merritt, Adam Merritt ([id number]) Assess peripheral edema status every visit. Compression as ordered Provide education on venous insufficiency Notes: Electronic Signature(s) Signed: 04/16/2018 5:03:03 PM By: Montey Hora Entered By: Montey Hora on 04/16/2018 13:03:02 Adam Merritt ([id number]) -------------------------------------------------------------------------------- Non-Wound Condition Assessment Details Patient Name: Adam Merritt Date of Service: 04/16/2018 12:45 PM Medical Record Number: [id number] Patient Account Number: [id number] Date of Birth/Sex: 12-29-1945 (72 y.o. M) Treating RN: Montey Hora Primary Care Jeter Tomey: [name] Other Clinician: Referring Shamarie Call: [name] Treating Chundra Sauerwein/Extender: STONE III, [name]: 1 Non-Wound Condition: Condition: Lymphedema Location: Leg Side: Bilateral Photos Periwound Skin Texture Texture Color No Abnormalities Noted: No No Abnormalities Noted: No Callus: No Atrophie Blanche: No Crepitus: No Cyanosis: No Excoriation: No Ecchymosis: No Friable: No Erythema: No Induration: Yes Hemosiderin Staining: Yes Rash: No Mottled: No Scarring: No Pallor: No Rubor: No Moisture No Abnormalities Noted: No Dry / Scaly: No Maceration: No Electronic Signature(s) Signed: 04/16/2018 4:59:39 PM By: Montey Hora Entered By: Montey Hora on 04/16/2018 16:59:39 Adam Merritt ([id number]) -------------------------------------------------------------------------------- Pain Assessment Details Patient Name: Adam Merritt Date of Service: 04/16/2018 12:45 PM Medical Record Number: [id number] Patient Account Number: [id number] Date of Birth/Sex: 10/25/1945 (72 y.o. M) Treating RN: Montey Hora Primary Care Elea Holtzclaw: [name]  Other Clinician: Referring Bastion Bolger: [name] Treating Thiago Ragsdale/Extender: Melburn Hake, [name]: 1 Active Problems Location of Pain Severity and Description of Pain Patient Has Paino Yes Site Locations Pain Location: Generalized Pain With Dressing Change: No Duration of the Pain. Constant / Intermittento Constant Pain Management and Medication Current Pain Management: Electronic Signature(s) Signed: 04/16/2018 5:03:03 PM By: Montey Hora Entered By: Montey Hora on 04/16/2018 12:53:41 Adam Merritt ([id number]) -------------------------------------------------------------------------------- Patient/Caregiver Education Details Patient Name: Adam Merritt Date of Service: 04/16/2018 12:45 PM Medical Record Number: [id number] Patient Account Number: [id number] Date of Birth/Gender: Oct 05, 1945 (72 y.o. M) Treating RN: Montey Hora Primary Care Physician: [name] Other Clinician: Referring Physician: [name] Treating Physician/Extender: Sharalyn Ink in Treatment: 1 Education Assessment Education Provided To: Patient and Caregiver Education Topics Provided Venous: Handouts: Other: leg elevation and reportalbe s/s Methods: Explain/Verbal Responses: State content correctly Electronic Signature(s) Signed: 04/16/2018 5:03:03 PM By: Montey Hora Entered By: Montey Hora on 04/16/2018 13:38:52 Adam Merritt ([id number]) -------------------------------------------------------------------------------- [place] Details Patient Name: Adam Merritt Date of Service: 04/16/2018 12:45 PM Medical Record Number: [id number] Patient Account Number: [id number] Date of Birth/Sex: [birth year] (72 y.o. M)  Treating RN: Montey Hora Primary Care Naeem Quillin: [name] Other Clinician: Referring Darcy Barbara: [name] Treating Jakorian Marengo/Extender: Melburn Hake, [name]: 1 Vital Signs Time Taken: 12:54 Temperature (F): 97.7 Height (in):  66 Pulse (bpm): 70 Weight (lbs): 323 Respiratory Rate (breaths/min): 20 Body Mass Index (BMI): 52.1 Blood Pressure (mmHg): 158/67 Reference Range: 80 - 120 mg / dl Electronic Signature(s) Signed: 04/16/2018 5:03:03 PM By: Montey Hora Entered By: Montey Hora on 04/16/2018 12:55:32

## 2018-04-18 NOTE — Care Management (Signed)
[place] with [facility] notified RNCM that patient is open with [facility] home [facility].  They are aware of patients discharge today.

## 2018-04-18 NOTE — Progress Notes (Signed)
Discharged to home with wife.  Follow up appointments made.  Reviewed changes to medication, and AVS information.

## 2018-04-18 NOTE — Consult Note (Signed)
PHARMACY CONSULT NOTE - FOLLOW UP  Pharmacy Consult for Electrolyte Monitoring and Replacement   Recent Labs: Potassium (mmol/L)  Date Value  04/18/2018 2.9 (L)  09/28/2014 3.5   Magnesium (mg/dL)  Date Value  09/23/2017 1.9   Calcium (mg/dL)  Date Value  04/18/2018 8.7 (L)   Calcium, Total (mg/dL)  Date Value  09/28/2014 8.5 (L)   Albumin (g/dL)  Date Value  12/09/2017 3.7  09/28/2014 3.6  ]   Assessment: Patient potassium low - pharmacy has been consulted for replenishment prior to discharge.  Goal of Therapy:  Potassium ~ 4  Plan:  IV potassium chloride 48meq x 4 - recheck potassium level 11/14@1600  (1 hour after final run) prior to discharge if possible   Lu Duffel ,PharmD Clinical Pharmacist 04/18/2018 10:28 AM

## 2018-04-18 NOTE — Progress Notes (Signed)
[facility]  ROUNDING NOTE   Subjective:  Patient's renal function relatively stable. Creatinine currently 1.93. Potassium low at 2.9 however potassium chloride intravenous has been ordered.    Objective:  Vital signs in last 24 hours:  Temp:  [97.8 F (36.6 C)-98 F (36.7 C)] 97.8 F (36.6 C) (11/14 0441) Pulse Rate:  [59-72] 59 (11/14 0745) Resp:  [16-18] 16 (11/14 0745) BP: (110-132)/(43-72) 132/56 (11/14 0745) SpO2:  [95 %-98 %] 95 % (11/14 0745) Weight:  [130.5 kg] 130.5 kg (11/14 0441)  Weight change: -15.7 kg Filed Weights   04/16/18 1428 04/18/18 0441  Weight: (!) 146.2 kg 130.5 kg    Intake/Output: I/O last 3 completed shifts: In: 360 [P.O.:360] Out: 7450 [Urine:7450]   Intake/Output this shift:  Total I/O In: 240 [P.O.:240] Out: 650 [Urine:650]  Physical Exam: General: No acute distress  Head: Normocephalic, atraumatic. Moist oral mucosal membranes  Eyes: Anicteric  Neck: Supple, trachea midline  Lungs:  Diminished b/l, exam degraded by obesity  Heart: S1S2 no rubs  Abdomen:  Soft, nontender, bowel sounds present  Extremities: 3+ peripheral edema, legs wrapped  Neurologic: Awake, alert, following commands  Skin: LE's wrapped       Basic Metabolic Panel: Recent Labs  Lab 04/16/18 1536 04/17/18 0542 04/18/18 0324  NA 137 137 138  K 3.3* 3.5 2.9*  CL 98 97* 93*  CO2 29 29 34*  GLUCOSE 163* 263* 155*  BUN 37* 39* 45*  CREATININE 2.20* 1.89* 1.93*  CALCIUM 9.0 9.0 8.7*    Liver Function Tests: No results for input(s): AST, ALT, ALKPHOS, BILITOT, PROT, ALBUMIN in the last 168 hours. No results for input(s): LIPASE, AMYLASE in the last 168 hours. No results for input(s): AMMONIA in the last 168 hours.  CBC: Recent Labs  Lab 04/16/18 1536 04/17/18 0542 04/18/18 0324  WBC 11.2* 10.1 12.3*  NEUTROABS 8.8*  --   --   HGB 12.7* 12.7* 11.9*  HCT 37.9* 36.4* 34.9*  MCV 96.4 94.1 95.1  PLT 151 153 141*    Cardiac  Enzymes: Recent Labs  Lab 04/16/18 1536 04/16/18 1919 04/16/18 2335 04/17/18 0542  TROPONINI <0.03 <0.03 <0.03 <0.03    BNP: Invalid input(s): POCBNP  CBG: Recent Labs  Lab 04/17/18 0802 04/17/18 1149 04/17/18 1651 04/17/18 2106 04/18/18 0759  GLUCAP 244* 213* 182* 255* 151*    Microbiology: Results for orders placed or performed during the hospital encounter of 08/29/17  Urine culture     Status: None   Collection Time: 08/29/17  7:04 AM  Result Value Ref Range Status   Specimen Description   Final    URINE, CATHETERIZED Performed at [facility], [address], [place], [place] [address]    Special Requests   Final    Normal Performed at [facility], [address] [place], [place] [address]    Culture   Final    NO GROWTH Performed at [facility], [place] [address] [place], [place] [address]    Report Status 08/30/2017 FINAL  Final  Body fluid culture     Status: None   Collection Time: 08/29/17  8:00 PM  Result Value Ref Range Status   Specimen Description   Final    SYNOVIAL RIGHT KNEE Performed at [facility], [address], [place], [place] [address]    Special Requests   Final    NONE Performed at [facility], [address] [place], [place] [address]  Gram Stain   Final    FEW WBC PRESENT, PREDOMINANTLY PMN NO ORGANISMS SEEN    Culture   Final    No growth aerobically or anaerobically. Performed at [facility], [place] [address], [place], [place] [address]    Report Status 09/02/2017 FINAL  Final    Coagulation Studies: No results for input(s): LABPROT, INR in the last 72 hours.  Urinalysis: Recent Labs    04/16/18 1815  COLORURINE YELLOW*  LABSPEC 1.009  PHURINE 5.0  GLUCOSEU NEGATIVE  HGBUR SMALL*  BILIRUBINUR NEGATIVE  KETONESUR NEGATIVE  PROTEINUR NEGATIVE  NITRITE NEGATIVE  LEUKOCYTESUR NEGATIVE      Imaging: Dg Chest 2 View  Result Date: 04/16/2018 CLINICAL  DATA:  Wheezing. EXAM: CHEST - 2 VIEW COMPARISON:  Radiograph of August 29, 2017. FINDINGS: Stable cardiomegaly. No pneumothorax is noted. Status post coronary bypass graft. Mild bibasilar subsegmental atelectasis is noted. No significant pleural effusion is noted. Bony thorax is unremarkable. IMPRESSION: Mild bibasilar subsegmental atelectasis is noted. Stable cardiomegaly with central pulmonary vascular congestion. Electronically Signed   By: Marijo Conception, M.D.   On: 04/16/2018 15:31   US Renal  Result Date: 04/17/2018 CLINICAL DATA:  Acute renal failure, history hypertension, diabetes mellitus, coronary artery disease EXAM: RENAL / URINARY TRACT ULTRASOUND COMPLETE COMPARISON:  08/29/2017 FINDINGS: Right Kidney: Renal measurements: 10.3 x 4.3 x 4.9 cm = volume: 112 mL. Mild age-related renal cortical atrophy. Mildly lobulated renal contours unchanged. Cortical echogenicity normal. No mass, hydronephrosis or shadowing calcification. Left Kidney: Renal measurements: 12.2 x 6.4 x 6.0 cm = volume: 243 mL. Minimal cortical thinning. Normal cortical echogenicity. No mass, hydronephrosis or shadowing calcification. Bladder: Appears normal for degree of bladder distention. IMPRESSION: Mild age-related renal cortical thinning. Otherwise normal exam. Electronically Signed   By: Lavonia Dana M.D.   On: 04/17/2018 11:07   US Venous Img Lower Bilateral  Result Date: 04/16/2018 CLINICAL DATA:  Asymmetric leg swelling for 1 week EXAM: BILATERAL LOWER EXTREMITY VENOUS DUPLEX ULTRASOUND TECHNIQUE: Doppler venous assessment of the bilateral lower extremity deep venous system was performed, including characterization of spectral flow, compressibility, and plasticity. COMPARISON:  None. FINDINGS: There is complete compressibility of the bilateral common femoral, femoral, and popliteal veins. Doppler analysis demonstrates respiratory phasicity and augmentation of flow with calf compression. No obvious superficial vein or  calf vein thrombosis. IMPRESSION: No evidence of lower extremity DVT. Electronically Signed   By: Marybelle Killings M.D.   On: 04/16/2018 16:50     Medications:   . potassium chloride     . aspirin  325 mg Oral Daily  . atorvastatin  40 mg Oral QHS  . carvedilol  25 mg Oral BID WC  . docusate sodium  100 mg Oral BID  . febuxostat  40 mg Oral Daily  . fentaNYL  50 mcg Transdermal Q72H  . furosemide  40 mg Intravenous Q12H  . gabapentin  300 mg Oral BID  . heparin  5,000 Units Subcutaneous Q8H  . hydrALAZINE  25 mg Oral TID  . insulin aspart  0-15 Units Subcutaneous TID WC  . insulin aspart  0-5 Units Subcutaneous QHS  . insulin glargine  30 Units Subcutaneous QHS  . multivitamin with minerals  1 tablet Oral Daily  . pantoprazole  40 mg Oral Daily  . pentoxifylline  400 mg Oral TID WC  . potassium chloride SA  20 mEq Oral BID  . senna-docusate  1 tablet Oral BID  . sodium chloride flush  3 mL Intravenous Q12H   acetaminophen **OR** acetaminophen, ipratropium-albuterol, ondansetron **OR** ondansetron (ZOFRAN) IV, oxyCODONE, sodium chloride flush, zolpidem  Assessment/ Plan:  72 y.o. male with a PMHx ofcoronary artery disease status post CABG, diabetes mellitus type 2, GERD, hyperlipidemia, hypertension, peripheral neuropathy, history of CVA, chronic LE edema/lymphedema presents with diastolic heart failure now.   1.  Acute renal failure/chronic kidney disease stage III baseline creatinine 1.4.  Suspect that cardiorenal syndrome may be playing some role at the moment.  -Continue furosemide 40 mg IV every 12 hours for now as the patient's weight it does appear to be declining.  Monitor renal parameters closely while on Lasix.  2.  Lower extremity edema.  Continue leg wraps as well as Lasix.  3.  Acute on chronic diastolic heart failure.  Weight is decreasing as above.  Continue furosemide IV for now.   LOS: 2 Kellsey Sansone 11/14/201910:36 AM

## 2018-04-18 NOTE — Progress Notes (Signed)
Adam, Merritt ([id number]) Visit Report for 04/16/2018 Chief Complaint Document Details Patient Name: Adam Merritt, Adam Merritt Date of Service: 04/16/2018 12:45 PM Medical Record Number: [id number] Patient Account Number: [id number] Date of Birth/Sex: 10-Aug-1945 (72 y.o. M) Treating RN: Montey Hora Primary Care Provider: [name] Other Clinician: Referring Provider: [name] Treating Provider/Extender: Melburn Hake, [name]: 1 Information Obtained from: Patient Chief Complaint he is here for evaluation of bilateral lower extremity lymphedema Electronic Signature(s) Signed: 04/16/2018 1:56:23 PM By: Worthy Keeler PA-C Entered By: Worthy Keeler on 04/16/2018 13:30:59 Adam Merritt ([id number]) -------------------------------------------------------------------------------- HPI Details Patient Name: Adam Merritt Date of Service: 04/16/2018 12:45 PM Medical Record Number: [id number] Patient Account Number: [id number] Date of Birth/Sex: 1945/06/18 (72 y.o. M) Treating RN: Montey Hora Primary Care Provider: [name] Other Clinician: Referring Provider: [name] Treating Provider/Extender: Melburn Hake, [name]: 1 History of Present Illness HPI Description: 10/18/17-He is here for initial evaluation of bilateral lower extremity ulcerations in the presence of venous insufficiency and lymphedema. He has been seen by vascular medicine in the past, Dr. Lucky Cowboy, last seen in 2016. He does have a history of abnormal ABIs, which is to be expected given his lymphedema and venous insufficiency. According to Epic, it appears that all attempts for arterial evaluation and/or angiography were not follow through with by patient. He does have a history of being seen in lymphedema clinic in 2018, stopped going approximately 6 months ago stating "it didn't do any good". He does not have lymphedema pumps, he does not have custom fit compression wrap/stockings. He is  diabetic and his recent A1c last month was 7.6. He admits to chronic bilateral lower extremity pain, no change in pain since blister and ulceration development. He is currently being treated with Levaquin for bronchitis. He has home health and we will continue. 10/25/17-He is here in follow-up evaluation for bilateral lower extremity ulcerationssubtle he remains on Levaquin for bronchitis. Right lower extremity with no evidence of drainage or ulceration, persistent left lower extremity ulceration. He states that home health has not been out since his appointment. He went to [facility] on Tuesday, studies revealed: RIGHT ABI 0.9, TBI 0.6 LEFT ABI 1.1, TBI 0.6 with triphasic flow bilaterally. We will continue his same treatment plan. He has been educated on compression therapy and need for elevation. He will benefit from lymphedema pumps 11/01/17-He is here in follow-up evaluation for left lower extremity ulcer. The right lower extremity remains healed. He has home health services, but they have not been out to see the patient for 2-3 weeks. He states it home health physical therapy changed his dressing yesterday after therapy; he placed Ace wrap compression. We are still waiting for lymphedema pumps, reordered d/t need for company change. 11/08/17-He is here in follow-up evaluation for left lower extremity ulcer. It is improved. Edema is significantly improved with compression therapy. We will continue with same treatment plan and he will follow-up next week. No word regarding lymphedema pumps 11/15/17-He is here in follow-up evaluation for left lower extremity ulcer. He is healed and will be discharged from wound care services. I have reached out to [facility] regarding his lymphedema pumps. They have been unable to reach the patient; the contact number they had with the patient's wife's cell phone and she has not answered any unrecognized calls. Contact should be made today,  trial planned for next week; [facility] will continue to follow 11/27/17 on evaluation today patient has  multiple blistered areas over the right lower extremity his left lower extremity appears to be doing okay. These blistered areas show signs of no infection which is great news. With that being said he did have some necrotic skin overlying which was mechanically debrided away with saline and gauze today without complication. Overall post debridement the wounds appear to be doing better but in general his swelling seems to be increased. This is obviously not good news. I think this is what has given rise to the blisters. 12/04/17 on evaluation today patient presents for follow-up concerning his bilateral lower extremity edema in the right lower extremity ulcers. He has been tolerating the dressing changes without complication. With that being said he has had no real issues with the wraps which is also good news. Overall I'm pleased with the progress he's been making. 12/11/17 on evaluation today patient appears to be doing rather well in regard to his right lateral lower extremity ulcer. He's been tolerating the dressing changes without complication. Fortunately there does not appear to be any evidence of infection at this time. Overall I'm pleased with the progress that is being made. Unfortunately he has been in the hospital due to having what sounds to be a stomach virus/flu fortunately that is starting to get better. 12/18/17 on evaluation today patient actually appears to be doing very well in regard to his bilateral lower extremities the swelling is under fairly good control his lymphedema pumps are still not up and running quite yet. With that being said he does have several areas of opening noted as far as wounds are concerned mainly over the left lower extremity. With that being said I do believe once he gets lymphedema pumps this would least help you mention some the fluid and preventing  this from occurring. Hopefully that will be set up soon sleeves are Artie in place at his home he just waiting for the machine. 12/25/17 on evaluation today patient actually appears to be doing excellent in fact all of his ulcers appear to have resolved his MARILYN, WING. ([id number]) legs appear very well. I do think he needs compression stockings we have discussed this and they are actually going to go to [place] today to elastic therapy to get this fitted for him. I think that is definitely a good thing to do. Readmission: 04/09/18 upon evaluation today this patient is seen for readmission due to bilateral lower extremity lymphedema. He has significant swelling of his extremities especially on the left although the right is also swollen he has weeping from both sides. There are no obvious open wounds at this point. Fortunately he has been doing fairly well for quite a bit of time since I last saw him. Nonetheless unfortunately this seems to have reopened and is giving quite a bit of trouble. He states this began about a week ago when he first called [place] to get in to be seen. No fevers, chills, nausea, or vomiting noted at this time. He has not been using his lymphedema pumps due to the fact that they won't fit on his leg at this point likewise is also not been using his compression for essentially the same reason. 04/16/18 upon evaluation today patient actually appears to be doing a little better in regard to the fluid in his bilateral lower extremities. With that being said he's had three falls since I saw him last week. He also states that he's been feeling very poorly. I was concerned last week and feel like that  the concern is still there as far as the congestion in his chest is concerned he seems to be breathing about the same as last week but again he states he's very weak he's not even able to walk further than from the chair to the door. His wife had to buy a wheelchair just to be able to  get them out of the house to get to the appointment today. This has me very concerned. Electronic Signature(s) Signed: 04/16/2018 1:56:23 PM By: Worthy Keeler PA-C Entered By: Worthy Keeler on 04/16/2018 13:52:09 Adam Merritt ([id number]) -------------------------------------------------------------------------------- Physical Exam Details Patient Name: Adam Merritt Date of Service: 04/16/2018 12:45 PM Medical Record Number: [id number] Patient Account Number: [id number] Date of Birth/Sex: 11-01-1945 (72 y.o. M) Treating RN: Montey Hora Primary Care Provider: [name] Other Clinician: Referring Provider: [name] Treating Provider/Extender: STONE III, [name]: 1 Constitutional patient is hypertensive.. pulse regular and within target range for patient.Marland Kitchen respirations regular, non-labored and within target range for patient.Marland Kitchen temperature within target range for patient.. Obese and well-hydrated in no acute distress. Eyes conjunctiva clear no eyelid edema noted. pupils equal round and reactive to light and accommodation. Ears, Nose, Mouth, and Throat no gross abnormality of ear auricles or external auditory canals. normal hearing noted during conversation. mucus membranes moist. Respiratory normal breathing without difficulty. Wheezing noted in bilateral lung fields with rhonchi noted. Cardiovascular regular rate and rhythm with normal S1, S2. Bilateral LE lymphedema. Gastrointestinal (GI) soft, non-tender, non-distended, +BS. no ventral hernia noted. Musculoskeletal Patient unable to walk without assistance. Psychiatric this patient is able to make decisions and demonstrates good insight into disease process. Alert and Oriented x 3. pleasant and cooperative. Notes Upon evaluation patient actually did have congestion in his chest which has me concerned about either the possibility of congestive heart failure or potentially that he may have pneumonia  and/or bronchitis. Nonetheless I think that is going to be necessary for him to be checked out further in this regard. Especially since he's become more week over the past week even this also has me very concerned. Electronic Signature(s) Signed: 04/16/2018 1:56:23 PM By: Worthy Keeler PA-C Entered By: Worthy Keeler on 04/16/2018 13:53:32 Adam Merritt ([id number]) -------------------------------------------------------------------------------- Physician Orders Details Patient Name: Adam Merritt Date of Service: 04/16/2018 12:45 PM Medical Record Number: [id number] Patient Account Number: [id number] Date of Birth/Sex: 1945/09/25 (72 y.o. M) Treating RN: Montey Hora Primary Care Provider: [name] Other Clinician: Referring Provider: [name] Treating Provider/Extender: Melburn Hake, [name]: 1 Verbal / Phone Orders: No Diagnosis Coding ICD-10 Coding Code Description L03.115 Cellulitis of right lower limb L03.116 Cellulitis of left lower limb I89.0 Lymphedema, not elsewhere classified E66.9 Obesity, unspecified Wound Cleansing o May Shower, gently pat wound dry prior to applying new dressing. - Please do not get your wraps wet but you can remove the wraps prior to nurse visits and wound clinic visits and shower and wash your legs Skin Barriers/Peri-Wound Care o Moisturizing lotion Secondary Dressing o XtraSorb - on any draining lymphedema Dressing Change Frequency o Other: - twice weekly - on Tuesdays and Fridays. Patient comes to [place] on Tuesdays, [facility] to change wraps on Fridays Follow-up Appointments o Return Appointment in 1 week. Edema Control o 3 Layer Compression System - Bilateral - may anchor with unna if needed o Other: - Tubigrip in clinic today r/t patient going from here to [place]  Visits o [place] Nurse may visit PRN to address patientos wound care  needs. o FACE TO FACE ENCOUNTER: MEDICARE and MEDICAID PATIENTS: I certify that this patient is under my care and that I had a face-to-face encounter that meets the physician face-to-face encounter requirements with this patient on this date. The encounter with the patient was in whole or in part for the following MEDICAL CONDITION: (primary reason for [place]) MEDICAL NECESSITY: I certify, that based on my findings, NURSING services are a medically necessary home health service. HOME BOUND STATUS: I certify that my clinical findings support that this patient is homebound (i.e., Due to illness or injury, pt requires aid of supportive devices such as crutches, cane, wheelchairs, walkers, the use of special transportation or the assistance of another person to leave their place of residence. There is a normal inability to leave the home and doing so requires considerable and taxing effort. Other absences are for medical reasons / religious services and are infrequent or of short duration when for other reasons). o If current dressing causes regression in wound condition, may D/C ordered dressing product/s and apply Normal Saline Moist Dressing daily until next [place] / Other MD appointment. [place] of regression in wound condition at [phone]. o Please direct any NON-WOUND related issues/requests for orders to patient's Primary Care Physician NAEEM, QUILLIN ([id number]) Electronic Signature(s) Signed: 04/16/2018 1:56:23 PM By: Worthy Keeler PA-C Signed: 04/16/2018 5:03:03 PM By: Montey Hora Entered By: Montey Hora on 04/16/2018 13:46:08 Adam Merritt ([id number]) -------------------------------------------------------------------------------- Problem List Details Patient Name: Adam Merritt Date of Service: 04/16/2018 12:45 PM Medical Record Number: [id number] Patient Account Number: [id number] Date of Birth/Sex: [birth year] (73 y.o.  M) Treating RN: Montey Hora Primary Care Provider: [name] Other Clinician: Referring Provider: [name] Treating Provider/Extender: Melburn Hake, [name]: 1 Active Problems ICD-10 Evaluated Encounter Code Description Active Date Today Diagnosis L03.115 Cellulitis of right lower limb 04/09/2018 No Yes L03.116 Cellulitis of left lower limb 04/09/2018 No Yes I89.0 Lymphedema, not elsewhere classified 04/09/2018 No Yes E66.9 Obesity, unspecified 04/09/2018 No Yes Inactive Problems Resolved Problems Electronic Signature(s) Signed: 04/16/2018 1:56:23 PM By: Worthy Keeler PA-C Entered By: Worthy Keeler on 04/16/2018 13:30:53 Adam Merritt ([id number]) -------------------------------------------------------------------------------- Progress Note Details Patient Name: Adam Merritt Date of Service: 04/16/2018 12:45 PM Medical Record Number: [id number] Patient Account Number: [id number] Date of Birth/Sex: 02-02-1946 (72 y.o. M) Treating RN: Montey Hora Primary Care Provider: [name] Other Clinician: Referring Provider: [name] Treating Provider/Extender: Melburn Hake, [name]: 1 Subjective Chief Complaint Information obtained from Patient he is here for evaluation of bilateral lower extremity lymphedema History of Present Illness (HPI) 10/18/17-He is here for initial evaluation of bilateral lower extremity ulcerations in the presence of venous insufficiency and lymphedema. He has been seen by vascular medicine in the past, Dr. Lucky Cowboy, last seen in 2016. He does have a history of abnormal ABIs, which is to be expected given his lymphedema and venous insufficiency. According to Epic, it appears that all attempts for arterial evaluation and/or angiography were not follow through with by patient. He does have a history of being seen in lymphedema clinic in 2018, stopped going approximately 6 months ago stating "it didn't do any good". He does  not have lymphedema pumps, he does not have custom fit compression wrap/stockings. He is diabetic and his recent A1c last month was 7.6. He admits to chronic bilateral lower extremity  pain, no change in pain since blister and ulceration development. He is currently being treated with Levaquin for bronchitis. He has home health and we will continue. 10/25/17-He is here in follow-up evaluation for bilateral lower extremity ulcerationssubtle he remains on Levaquin for bronchitis. Right lower extremity with no evidence of drainage or ulceration, persistent left lower extremity ulceration. He states that home health has not been out since his appointment. He went to [facility] on Tuesday, studies revealed: RIGHT ABI 0.9, TBI 0.6 LEFT ABI 1.1, TBI 0.6 with triphasic flow bilaterally. We will continue his same treatment plan. He has been educated on compression therapy and need for elevation. He will benefit from lymphedema pumps 11/01/17-He is here in follow-up evaluation for left lower extremity ulcer. The right lower extremity remains healed. He has home health services, but they have not been out to see the patient for 2-3 weeks. He states it home health physical therapy changed his dressing yesterday after therapy; he placed Ace wrap compression. We are still waiting for lymphedema pumps, reordered d/t need for company change. 11/08/17-He is here in follow-up evaluation for left lower extremity ulcer. It is improved. Edema is significantly improved with compression therapy. We will continue with same treatment plan and he will follow-up next week. No word regarding lymphedema pumps 11/15/17-He is here in follow-up evaluation for left lower extremity ulcer. He is healed and will be discharged from wound care services. I have reached out to [facility] regarding his lymphedema pumps. They have been unable to reach the patient; the contact number they had with the patient's wife's cell  phone and she has not answered any unrecognized calls. Contact should be made today, trial planned for next week; [facility] will continue to follow 11/27/17 on evaluation today patient has multiple blistered areas over the right lower extremity his left lower extremity appears to be doing okay. These blistered areas show signs of no infection which is great news. With that being said he did have some necrotic skin overlying which was mechanically debrided away with saline and gauze today without complication. Overall post debridement the wounds appear to be doing better but in general his swelling seems to be increased. This is obviously not good news. I think this is what has given rise to the blisters. 12/04/17 on evaluation today patient presents for follow-up concerning his bilateral lower extremity edema in the right lower extremity ulcers. He has been tolerating the dressing changes without complication. With that being said he has had no real issues with the wraps which is also good news. Overall I'm pleased with the progress he's been making. 12/11/17 on evaluation today patient appears to be doing rather well in regard to his right lateral lower extremity ulcer. He's been tolerating the dressing changes without complication. Fortunately there does not appear to be any evidence of infection at this time. Overall I'm pleased with the progress that is being made. Unfortunately he has been in the hospital due to having what sounds to be a stomach virus/flu fortunately that is starting to get better. 12/18/17 on evaluation today patient actually appears to be doing very well in regard to his bilateral lower extremities the DEADRIAN, TOYA. ([id number]) swelling is under fairly good control his lymphedema pumps are still not up and running quite yet. With that being said he does have several areas of opening noted as far as wounds are concerned mainly over the left lower extremity. With  that being said I do  believe once he gets lymphedema pumps this would least help you mention some the fluid and preventing this from occurring. Hopefully that will be set up soon sleeves are Artie in place at his home he just waiting for the machine. 12/25/17 on evaluation today patient actually appears to be doing excellent in fact all of his ulcers appear to have resolved his legs appear very well. I do think he needs compression stockings we have discussed this and they are actually going to go to [place] today to elastic therapy to get this fitted for him. I think that is definitely a good thing to do. Readmission: 04/09/18 upon evaluation today this patient is seen for readmission due to bilateral lower extremity lymphedema. He has significant swelling of his extremities especially on the left although the right is also swollen he has weeping from both sides. There are no obvious open wounds at this point. Fortunately he has been doing fairly well for quite a bit of time since I last saw him. Nonetheless unfortunately this seems to have reopened and is giving quite a bit of trouble. He states this began about a week ago when he first called [place] to get in to be seen. No fevers, chills, nausea, or vomiting noted at this time. He has not been using his lymphedema pumps due to the fact that they won't fit on his leg at this point likewise is also not been using his compression for essentially the same reason. 04/16/18 upon evaluation today patient actually appears to be doing a little better in regard to the fluid in his bilateral lower extremities. With that being said he's had three falls since I saw him last week. He also states that he's been feeling very poorly. I was concerned last week and feel like that the concern is still there as far as the congestion in his chest is concerned he seems to be breathing about the same as last week but again he states he's very weak he's not even able to walk  further than from the chair to the door. His wife had to buy a wheelchair just to be able to get them out of the house to get to the appointment today. This has me very concerned. Patient History Information obtained from Patient. Family History Cancer - Paternal Grandparents, Kidney Disease - Siblings, Lung Disease - Mother, No family history of Diabetes, Heart Disease, Hereditary Spherocytosis, Hypertension, Seizures, Stroke, Thyroid Problems, Tuberculosis. Social History Never smoker, Marital Status - Married, Alcohol Use - Never, Drug Use - No History, Caffeine Use - Never. Medical History Hospitalization/Surgery History - 11/03/2013, [facility], bronchitis. Medical And Surgical History Notes Respiratory home O2 at night as needed Cardiovascular varicose veins Neurologic CVA - 1992 Review of Systems (ROS) Constitutional Symptoms (General Health) Denies complaints or symptoms of Fever, Chills. Respiratory The patient has no complaints or symptoms. Cardiovascular Complains or has symptoms of LE edema. Psychiatric The patient has no complaints or symptoms. KASAI, BELTRAN ([id number]) Objective Constitutional patient is hypertensive.. pulse regular and within target range for patient.Marland Kitchen respirations regular, non-labored and within target range for patient.Marland Kitchen temperature within target range for patient.. Obese and well-hydrated in no acute distress. Vitals Time Taken: 12:54 PM, Height: 66 in, Weight: 323 lbs, BMI: 52.1, Temperature: 97.7 F, Pulse: 70 bpm, Respiratory Rate: 20 breaths/min, Blood Pressure: 158/67 mmHg. Eyes conjunctiva clear no eyelid edema noted. pupils equal round and reactive to light and accommodation. Ears, Nose, Mouth, and Throat no gross abnormality of ear auricles  or external auditory canals. normal hearing noted during conversation. mucus membranes moist. Respiratory normal breathing without difficulty. Wheezing noted in bilateral lung fields with rhonchi  noted. Cardiovascular regular rate and rhythm with normal S1, S2. Bilateral LE lymphedema. Gastrointestinal (GI) soft, non-tender, non-distended, +BS. no ventral hernia noted. Musculoskeletal Patient unable to walk without assistance. Psychiatric this patient is able to make decisions and demonstrates good insight into disease process. Alert and Oriented x 3. pleasant and cooperative. General Notes: Upon evaluation patient actually did have congestion in his chest which has me concerned about either the possibility of congestive heart failure or potentially that he may have pneumonia and/or bronchitis. Nonetheless I think that is going to be necessary for him to be checked out further in this regard. Especially since he's become more week over the past week even this also has me very concerned. Assessment Active Problems ICD-10 Cellulitis of right lower limb Cellulitis of left lower limb Lymphedema, not elsewhere classified RAYLIN, WINER ([id number]) Obesity, unspecified Plan Wound Cleansing: May Shower, gently pat wound dry prior to applying new dressing. - Please do not get your wraps wet but you can remove the wraps prior to nurse visits and wound clinic visits and shower and wash your legs Skin Barriers/Peri-Wound Care: Moisturizing lotion Secondary Dressing: XtraSorb - on any draining lymphedema Dressing Change Frequency: Other: - twice weekly - on Tuesdays and Fridays. Patient comes to [place] on Tuesdays, [facility] to change wraps on Fridays Follow-up Appointments: Return Appointment in 1 week. Edema Control: 3 Layer Compression System - Bilateral - may anchor with unna if needed Other: - Tubigrip in clinic today r/t patient going from here to [facility] ED [place]: [place] Nurse may visit PRN to address patient s wound care needs. FACE TO FACE ENCOUNTER: MEDICARE and MEDICAID PATIENTS: I certify that this patient is under my  care and that I had a face-to-face encounter that meets the physician face-to-face encounter requirements with this patient on this date. The encounter with the patient was in whole or in part for the following MEDICAL CONDITION: (primary reason for [place]) MEDICAL NECESSITY: I certify, that based on my findings, NURSING services are a medically necessary home health service. HOME BOUND STATUS: I certify that my clinical findings support that this patient is homebound (i.e., Due to illness or injury, pt requires aid of supportive devices such as crutches, cane, wheelchairs, walkers, the use of special transportation or the assistance of another person to leave their place of residence. There is a normal inability to leave the home and doing so requires considerable and taxing effort. Other absences are for medical reasons / religious services and are infrequent or of short duration when for other reasons). If current dressing causes regression in wound condition, may D/C ordered dressing product/s and apply Normal Saline Moist Dressing daily until next [place] / Other MD appointment. [place] of regression in wound condition at [phone]. Please direct any NON-WOUND related issues/requests for orders to patient's Primary Care Physician At this point my concern was for the possibility again of congestive heart failure exacerbation versus possibly the patient having pneumonia and/or bronchitis. I recommended an ER visit to further evaluate this currently. I do not see any evidence of cellulitis in regard to bilateral lower extremities he does have edema unfortunately. This is a little better than last week but again I'm still concerned about the fact that his legs given out he's  falling three times and in general appears to be doing very poorly. At this time I'm gonna recommend that we go ahead and transport him to the ER the EMS since his wife is  having difficulty even getting them into the car. He's in agreement with this plan. If anything changes or worsens meantime they will let me know otherwise will see him back for normal evaluation. We'll see what the ER says concerning his acute worsening. Electronic Signature(s) Signed: 04/16/2018 1:56:23 PM By: Worthy Keeler PA-C Entered By: Worthy Keeler on 04/16/2018 13:54:41 Adam Merritt ([id number]) CATHERINE, OAK ([id number]) -------------------------------------------------------------------------------- ROS/PFSH Details Patient Name: Adam Merritt Date of Service: 04/16/2018 12:45 PM Medical Record Number: [id number] Patient Account Number: [id number] Date of Birth/Sex: 01-Feb-1946 (72 y.o. M) Treating RN: Montey Hora Primary Care Provider: [name] Other Clinician: Referring Provider: [name] Treating Provider/Extender: Melburn Hake, [name]: 1 Information Obtained From Patient Wound History Do you currently have one or more open woundso No Have you tested positive for osteomyelitis (bone infection)o No Have you had any tests for circulation on your legso Yes Constitutional Symptoms (General Health) Complaints and Symptoms: Negative for: Fever; Chills Cardiovascular Complaints and Symptoms: Positive for: LE edema Medical History: Positive for: Congestive Heart Failure; Coronary Artery Disease; Hypertension; Peripheral Venous Disease Negative for: Angina; Arrhythmia; Deep Vein Thrombosis; Hypotension; Myocardial Infarction; Peripheral Arterial Disease; Phlebitis; Vasculitis Past Medical History Notes: varicose veins Eyes Medical History: Negative for: Cataracts; Glaucoma; Optic Neuritis Ear/Nose/Mouth/Throat Medical History: Negative for: Chronic sinus problems/congestion; Middle ear problems Hematologic/Lymphatic Medical History: Positive for: Anemia - aplastic anemia; Lymphedema Negative for: Hemophilia; Human Immunodeficiency Virus;  Sickle Cell Disease Respiratory Complaints and Symptoms: No Complaints or Symptoms Medical History: Positive for: Chronic Obstructive Pulmonary Disease (COPD); Sleep Apnea Negative for: Aspiration; Asthma; Pneumothorax; Tuberculosis Past Medical History Notes: home O2 at night as needed DAVONTE, SIEBENALER ([id number]) Gastrointestinal Medical History: Negative for: Cirrhosis ; Colitis; Crohnos; Hepatitis A; Hepatitis B; Hepatitis C Endocrine Medical History: Positive for: Type II Diabetes Negative for: Type I Diabetes Time with diabetes: since 2006 Treated with: Insulin Blood sugar tested every day: Yes Tested : Blood sugar testing results: Breakfast: 209 Genitourinary Medical History: Negative for: End Stage Renal Disease Immunological Medical History: Negative for: Lupus Erythematosus; Raynaudos; Scleroderma Integumentary (Skin) Medical History: Negative for: History of Burn; History of pressure wounds Musculoskeletal Medical History: Positive for: Osteoarthritis Negative for: Gout; Rheumatoid Arthritis; Osteomyelitis Neurologic Medical History: Positive for: Neuropathy Negative for: Dementia; Quadriplegia; Paraplegia; Seizure Disorder Past Medical History Notes: CVA - 1992 Oncologic Medical History: Positive for: Received Chemotherapy - last dose 2006 Negative for: Received Radiation Psychiatric Complaints and Symptoms: No Complaints or Symptoms Medical History: Negative for: Anorexia/bulimia; Confinement Anxiety TWAIN, STENSETH ([id number]) Immunizations Pneumococcal Vaccine: Received Pneumococcal Vaccination: Yes Implantable Devices Hospitalization / Surgery History Name of Hospital Purpose of Hospitalization/Surgery Date [facility] bronchitis 11/03/2013 Family and Social History Cancer: Yes - Paternal Grandparents; Diabetes: No; Heart Disease: No; Hereditary Spherocytosis: No; Hypertension: No; Kidney Disease: Yes - Siblings; Lung Disease: Yes - Mother; Seizures:  No; Stroke: No; Thyroid Problems: No; Tuberculosis: No; Never smoker; Marital Status - Married; Alcohol Use: Never; Drug Use: No History; Caffeine Use: Never; Financial Concerns: No; Food, Clothing or Shelter Needs: No; Support System Lacking: No; Transportation Concerns: No; Advanced Directives: No; Patient does not want information on Advanced Directives Physician Affirmation I have reviewed and agree with the above information. Electronic Signature(s) Signed: 04/16/2018 1:56:23 PM By: Worthy Keeler  PA-C Signed: 04/16/2018 5:03:03 PM By: Montey Hora Entered By: Worthy Keeler on 04/16/2018 13:52:27 Adam Merritt ([id number]) -------------------------------------------------------------------------------- SuperBill Details Patient Name: Adam Merritt Date of Service: 04/16/2018 Medical Record Number: [id number] Patient Account Number: [id number] Date of Birth/Sex: 1945/10/23 (72 y.o. M) Treating RN: Montey Hora Primary Care Provider: [name] Other Clinician: Referring Provider: [name] Treating Provider/Extender: Melburn Hake, [name]: 1 Diagnosis Coding ICD-10 Codes Code Description L03.115 Cellulitis of right lower limb L03.116 Cellulitis of left lower limb I89.0 Lymphedema, not elsewhere classified E66.9 Obesity, unspecified Facility Procedures CPT4: Description Modifier Quantity Code 08657846 [id number] BILATERAL: Application of multi-layer venous compression system; leg (below 1 knee), including ankle and foot. Physician Procedures CPT4 Code: 2841324 Description: 40102 - WC PHYS LEVEL 4 - EST PT ICD-10 Diagnosis Description L03.115 Cellulitis of right lower limb L03.116 Cellulitis of left lower limb I89.0 Lymphedema, not elsewhere classified E66.9 Obesity, unspecified Modifier: Quantity: 1 Electronic Signature(s) Signed: 04/16/2018 1:56:23 PM By: Worthy Keeler PA-C Entered By: Worthy Keeler on 04/16/2018 13:54:57

## 2018-04-18 NOTE — Progress Notes (Signed)
Inpatient Diabetes Program Recommendations  AACE/ADA: New Consensus Statement on Inpatient Glycemic Control (2019)  Target Ranges:  Prepandial:   less than 140 mg/dL      Peak postprandial:   less than 180 mg/dL (1-2 hours)      Critically ill patients:  140 - 180 mg/dL   Results for LANDY, DUNNAVANT (MRN [id number]) as of 04/18/2018 08:55  Ref. Range 04/17/2018 08:02 04/17/2018 11:49 04/17/2018 16:51 04/17/2018 21:06 04/18/2018 07:59  Glucose-Capillary Latest Ref Range: 70 - 99 mg/dL 244 (H) 213 (H) 182 (H) 255 (H) 151 (H)    Review of Glycemic Control  Diabetes history: DM2 Outpatient Diabetes medications: Basaglar 30-36 units daily, Victoza 1.8 mg daily Current orders for Inpatient glycemic control: Lantus 30 units QHS, Novolog 0-15 units TID with meals, Novolog 0-5 units QHS  Inpatient Diabetes Program Recommendations:  Insulin - Meal Coverage: Please consider ordering Novolog 4 units TID with meals for meal coverage if patient eats at least 50% of meals.  Thanks, Barnie Alderman, RN, MSN, CDE Diabetes Coordinator Inpatient Diabetes Program [phone] (Team Pager from 8am to 5pm)

## 2018-04-20 NOTE — Discharge Summary (Signed)
[facility] [place] NAME: Adam Merritt    MR#:  [id number]  DATE OF BIRTH:  04/24/1946  DATE OF ADMISSION:  04/16/2018   ADMITTING PHYSICIAN: Gladstone Lighter, MD  DATE OF DISCHARGE: 04/18/2018  2:11 PM  PRIMARY CARE PHYSICIAN: [name], MD   ADMISSION DIAGNOSIS:  Acute pulmonary edema (HCC) [J81.0] ARF (acute renal failure) (HCC) [N17.9] Acute kidney injury superimposed on chronic kidney disease ([place]) [N17.9, N18.9] Acute on chronic congestive heart failure, unspecified heart failure type ([place]) [I50.9] DISCHARGE DIAGNOSIS:  Active Problems:   CHF exacerbation ([place])  SECONDARY DIAGNOSIS:   Past Medical History:  Diagnosis Date  . Anemia   . CAD (coronary artery disease)   . Chest pain   . Coronary artery disease   . Diabetes mellitus without complication ([place])   . Diastolic CHF ([place])   . Esophageal reflux   . Herpes zoster without mention of complication   . Hyperlipidemia   . Hypertension   . Insomnia   . Lumbago   . Lymphedema   . Neuropathy in diabetes ([place])   . Obstructive chronic bronchitis without exacerbation ([place])   . Other malaise and fatigue   . Stroke ([place])   . Varicose veins    HOSPITAL COURSE:  Adam Merritt a72 y.o.malewith a known history of CAD status post CABG, diastolic CHF, diabetes mellitus, tonic lymphedema, diabetic neuropathy, history of stroke, anemia of chronic disease admitted from wound care clinic secondary to worsening weakness and dyspnea.  1.Acute on chronic diastolic CHF exacerbation-increased weight gain. - Diuresed well while inpt. -Last echo in April 2018 showing EF of 60 to 65%  2. Acute renal failure on CKD stage III-Baseline creatinine seems to be around 1.4 - close to baseline  3. Chronic lower extremity edema/lymphedema-follows with vascular and also wound care clinic. -Continue dressing changes as recommended. Left lower extremity is always larger than the  right, Dopplers negative for DVT - appreciate wound care nurse input  4. Diabetes mellitus-controlled on home regimen  5. Obstructive sleep apnea-continue CPAP  6. Chronic low back pain-stable  Wound type: Chronic nonhealing nonintact skin to left lower leg.  Chronic edema, requiring compression.  Pressure Injury POA: NA Measurement: Circumferential sloughing epithelium to left lower leg near malleolus.  Dressing procedure/placement/frequency: Cleanse bilateral lower legs with soap and water and pat dry.  Apply Aquacel Ag to nonintact lesion left lower leg.  Wrap bilateral legs with zinc layer from below toes to below knee.  Secure with self adherent coban. Change twice weekly DISCHARGE CONDITIONS:  stable CONSULTS OBTAINED:  Treatment Team:  Anthonette Legato, MD Yolonda Kida, MD DRUG ALLERGIES:  No Known Allergies DISCHARGE MEDICATIONS:   Allergies as of 04/18/2018   No Known Allergies     Medication List    STOP taking these medications   doxycycline 100 MG tablet Commonly known as:  VIBRA-TABS     TAKE these medications   albuterol 108 (90 Base) MCG/ACT inhaler Commonly known as:  PROVENTIL HFA;VENTOLIN HFA INHALE 1 PUFF INTO THE LUNGS EVERY 6 HOURS AS NEEDED FOR WHEEZING OR SHORTNESS OF BREATH What changed:  See the new instructions.   aspirin 325 MG EC tablet Take 325 mg by mouth daily.   atorvastatin 40 MG tablet Commonly known as:  LIPITOR Take 40 mg by mouth at bedtime.   BASAGLAR KWIKPEN 100 UNIT/ML Sopn INJECT 30 TO 36 UNITS UNDER THE SKIN EVERY DAY What changed:    how much  to take  how to take this  when to take this  additional instructions   carvedilol 25 MG tablet Commonly known as:  COREG Take 25 mg by mouth 2 (two) times daily with a meal.   colchicine 0.6 MG tablet Take 1 tablet (0.6 mg total) by mouth daily.   docusate sodium 50 MG capsule Commonly known as:  COLACE Take 50 mg by mouth 2 (two) times daily.     febuxostat 40 MG tablet Commonly known as:  ULORIC Take 1 tablet (40 mg total) by mouth daily.   fentaNYL 50 MCG/HR Commonly known as:  DURAGESIC - dosed mcg/hr Place 1 patch (50 mcg total) onto the skin every 3 (three) days. Notes to patient:  NONE TODAY   furosemide 40 MG tablet Commonly known as:  LASIX Take 1 tablet (40 mg total) by mouth daily. What changed:    medication strength  how much to take   gabapentin 300 MG capsule Commonly known as:  NEURONTIN Take 1 capsule (300 mg total) by mouth 2 (two) times daily.   hydrALAZINE 25 MG tablet Commonly known as:  APRESOLINE TAKE 1 TABLET BY MOUTH THREE TIMES DAILY FOR BLOOD PRESSURE What changed:    how much to take  how to take this  when to take this  additional instructions   ibuprofen 600 MG tablet Commonly known as:  ADVIL,MOTRIN Take 600 mg by mouth 2 (two) times daily as needed for mild pain or moderate pain.   indapamide 2.5 MG tablet Commonly known as:  LOZOL Take 1 tablet (2.5 mg total) by mouth daily. Notes to patient:  NONE GIVEN TODAY.  TAKE ONE WHEN YOU GET HOME OR RESTART TOMORROW   ipratropium-albuterol 0.5-2.5 (3) MG/3ML Soln Commonly known as:  DUONEB Take 3 mLs by nebulization every 4 (four) hours as needed (for shortness of breath).   liraglutide 18 MG/3ML Sopn Commonly known as:  VICTOZA Inject 0.3 mLs (1.8 mg total) into the skin daily. Notes to patient:  NONE GIVEN TODAY, RESTART TOMORROW   metolazone 2.5 MG tablet Commonly known as:  ZAROXOLYN Take 1 tablet (2.5 mg total) by mouth daily. Notes to patient:  NONE GIVEN TODAY.  TAKE ONE WHEN YOU GET HOME OR RESTART TOMORROW   multivitamin with minerals Tabs tablet Take 1 tablet by mouth daily.   omeprazole 20 MG capsule Commonly known as:  PRILOSEC Take 20 mg by mouth daily.   Oxycodone HCl 10 MG Tabs Half to one tab as needed prn for pain What changed:  additional instructions   pentoxifylline 400 MG CR tablet Commonly  known as:  TRENTAL TAKE 1 TABLET BY MOUTH THREE TIMES DAILY FOR CLAUDICATION What changed:  See the new instructions.   potassium chloride 20 MEQ packet Commonly known as:  KLOR-CON Take 20 mEq by mouth 2 (two) times daily. Take it only with lasix   potassium chloride SA 20 MEQ tablet Commonly known as:  K-DUR,KLOR-CON Take 20 mEq by mouth 2 (two) times daily. Notes to patient:  Ignore this it is a duplicate entry.   sennosides-docusate sodium 8.6-50 MG tablet Commonly known as:  SENOKOT-S Take 1-2 tablets by mouth 2 (two) times daily.   UREA 20 INTENSIVE HYDRATING 20 % cream Generic drug:  urea USE AS DIRECTED 3 TIMES A WEEK   zolpidem 12.5 MG CR tablet Commonly known as:  AMBIEN CR Take 1 tablet (12.5 mg total) by mouth at bedtime as needed for sleep.      DISCHARGE  INSTRUCTIONS:   DIET:  Regular diet DISCHARGE CONDITION:  Good ACTIVITY:  Activity as tolerated OXYGEN:  Home Oxygen: No.  Oxygen Delivery: room air DISCHARGE LOCATION:  home with home health, PT  If you experience worsening of your admission symptoms, develop shortness of breath, life threatening emergency, suicidal or homicidal thoughts you must seek medical attention immediately by calling 911 or calling your MD immediately  if symptoms less severe.  You Must read complete instructions/literature along with all the possible adverse reactions/side effects for all the Medicines you take and that have been prescribed to you. Take any new Medicines after you have completely understood and accpet all the possible adverse reactions/side effects.   Please note  You were cared for by a hospitalist during your hospital stay. If you have any questions about your discharge medications or the care you received while you were in the hospital after you are discharged, you can call the unit and asked to speak with the hospitalist on call if the hospitalist that took care of you is not available. Once you are  discharged, your primary care physician will handle any further medical issues. Please note that NO REFILLS for any discharge medications will be authorized once you are discharged, as it is imperative that you return to your primary care physician (or establish a relationship with a primary care physician if you do not have one) for your aftercare needs so that they can reassess your need for medications and monitor your lab values.    On the day of Discharge:  VITAL SIGNS:  Blood pressure (!) 132/56, pulse (!) 59, temperature 97.8 F (36.6 C), temperature source Oral, resp. rate 16, height 5\' 6"  (1.676 m), weight 130.5 kg, SpO2 95 %. PHYSICAL EXAMINATION:  GENERAL:  72 y.o.-year-old patient lying in the bed with no acute distress.  EYES: Pupils equal, round, reactive to light and accommodation. No scleral icterus. Extraocular muscles intact.  HEENT: Head atraumatic, normocephalic. Oropharynx and nasopharynx clear.  NECK:  Supple, no jugular venous distention. No thyroid enlargement, no tenderness.  LUNGS: Normal breath sounds bilaterally, no wheezing, rales,rhonchi or crepitation. No use of accessory muscles of respiration.  CARDIOVASCULAR: S1, S2 normal. No murmurs, rubs, or gallops.  ABDOMEN: Soft, non-tender, non-distended. Bowel sounds present. No organomegaly or mass.  EXTREMITIES: No pedal edema, cyanosis, or clubbing.  NEUROLOGIC: Cranial nerves II through XII are intact. Muscle strength 5/5 in all extremities. Sensation intact. Gait not checked.  PSYCHIATRIC: The patient is alert and oriented x 3.  SKIN: No obvious rash, lesion, or ulcer.  DATA REVIEW:   CBC Recent Labs  Lab 04/18/18 0324  WBC 12.3*  HGB 11.9*  HCT 34.9*  PLT 141*    Chemistries  Recent Labs  Lab 04/18/18 0324  NA 138  K 2.9*  CL 93*  CO2 34*  GLUCOSE 155*  BUN 45*  CREATININE 1.93*  CALCIUM 8.7*  MG 2.2    Follow-up Information    [facility] Follow  up on 04/24/2018.   Specialty:  Cardiology Why:  at 1:00pm Contact information: [place] [phone]           Management plans discussed with the patient, family and they are in agreement.  CODE STATUS: Prior   TOTAL TIME TAKING CARE OF THIS PATIENT: 45 minutes.    Max Sane M.D on 04/20/2018 at 5:57 PM  Between 7am to 6pm - Pager - [phone]  After 6pm go to [email or web address] - password [email or web address] [facility]  [employer] [place] [facility]  Office  [phone]  CC: Primary care physician; [name], MD   Note: This dictation was prepared with Dragon dictation along with smaller phrase technology. Any transcriptional errors that result from this process are unintentional.

## 2018-04-23 ENCOUNTER — Encounter: Payer: Medicare Other | Admitting: Physician Assistant

## 2018-04-23 DIAGNOSIS — I2510 Atherosclerotic heart disease of native coronary artery without angina pectoris: Secondary | ICD-10-CM | POA: Diagnosis not present

## 2018-04-23 DIAGNOSIS — L03116 Cellulitis of left lower limb: Secondary | ICD-10-CM | POA: Diagnosis not present

## 2018-04-23 DIAGNOSIS — I890 Lymphedema, not elsewhere classified: Secondary | ICD-10-CM | POA: Diagnosis not present

## 2018-04-23 DIAGNOSIS — I110 Hypertensive heart disease with heart failure: Secondary | ICD-10-CM | POA: Diagnosis not present

## 2018-04-23 DIAGNOSIS — Z8673 Personal history of transient ischemic attack (TIA), and cerebral infarction without residual deficits: Secondary | ICD-10-CM | POA: Diagnosis not present

## 2018-04-23 DIAGNOSIS — E1140 Type 2 diabetes mellitus with diabetic neuropathy, unspecified: Secondary | ICD-10-CM | POA: Diagnosis present

## 2018-04-23 DIAGNOSIS — I509 Heart failure, unspecified: Secondary | ICD-10-CM | POA: Diagnosis not present

## 2018-04-23 DIAGNOSIS — J449 Chronic obstructive pulmonary disease, unspecified: Secondary | ICD-10-CM | POA: Diagnosis not present

## 2018-04-23 DIAGNOSIS — L03115 Cellulitis of right lower limb: Secondary | ICD-10-CM | POA: Diagnosis not present

## 2018-04-23 DIAGNOSIS — Z6841 Body Mass Index (BMI) 40.0 and over, adult: Secondary | ICD-10-CM | POA: Diagnosis not present

## 2018-04-23 DIAGNOSIS — E11622 Type 2 diabetes mellitus with other skin ulcer: Secondary | ICD-10-CM | POA: Diagnosis not present

## 2018-04-23 DIAGNOSIS — G4730 Sleep apnea, unspecified: Secondary | ICD-10-CM | POA: Diagnosis not present

## 2018-04-23 DIAGNOSIS — I872 Venous insufficiency (chronic) (peripheral): Secondary | ICD-10-CM | POA: Diagnosis not present

## 2018-04-23 DIAGNOSIS — E669 Obesity, unspecified: Secondary | ICD-10-CM | POA: Diagnosis not present

## 2018-04-23 DIAGNOSIS — L97929 Non-pressure chronic ulcer of unspecified part of left lower leg with unspecified severity: Secondary | ICD-10-CM | POA: Diagnosis not present

## 2018-04-23 DIAGNOSIS — G8929 Other chronic pain: Secondary | ICD-10-CM | POA: Diagnosis not present

## 2018-04-24 ENCOUNTER — Ambulatory Visit: Payer: Medicare Other | Admitting: Family

## 2018-04-25 NOTE — Progress Notes (Signed)
BECKETT, MADEN ([id number]) Visit Report for 04/23/2018 Chief Complaint Document Details Patient Name: Adam Merritt, Adam Merritt Date of Service: 04/23/2018 12:45 PM Medical Record Number: [id number] Patient Account Number: [id number] Date of Birth/Sex: [birth year] (72 y.o. M) Treating RN: Montey Hora Primary Care Provider: [name] Other Clinician: Referring Provider: [name] Treating Provider/Extender: Melburn Hake, Miela Desjardin [name]: 2 Information Obtained from: Patient Chief Complaint he is here for evaluation of bilateral lower extremity lymphedema Electronic Signature(s) Signed: 04/23/2018 5:24:21 PM By: Worthy Keeler PA-C Entered By: Worthy Keeler on 04/23/2018 13:23:05 Adam Merritt ([id number]) -------------------------------------------------------------------------------- HPI Details Patient Name: Adam Merritt Date of Service: 04/23/2018 12:45 PM Medical Record Number: [id number] Patient Account Number: [id number] Date of Birth/Sex: 19-Oct-1945 (72 y.o. M) Treating RN: Montey Hora Primary Care Provider: [name] Other Clinician: Referring Provider: [name] Treating Provider/Extender: Melburn Hake, Love Chowning [name]: 2 History of Present Illness HPI Description: 10/18/17-He is here for initial evaluation of bilateral lower extremity ulcerations in the presence of venous insufficiency and lymphedema. He has been seen by vascular medicine in the past, Dr. Lucky Cowboy, last seen in 2016. He does have a history of abnormal ABIs, which is to be expected given his lymphedema and venous insufficiency. According to Epic, it appears that all attempts for arterial evaluation and/or angiography were not follow through with by patient. He does have a history of being seen in lymphedema clinic in 2018, stopped going approximately 6 months ago stating "it didn't do any good". He does not have lymphedema pumps, he does not have custom fit compression wrap/stockings. He is  diabetic and his recent A1c last month was 7.6. He admits to chronic bilateral lower extremity pain, no change in pain since blister and ulceration development. He is currently being treated with Levaquin for bronchitis. He has home health and we will continue. 10/25/17-He is here in follow-up evaluation for bilateral lower extremity ulcerationssubtle he remains on Levaquin for bronchitis. Right lower extremity with no evidence of drainage or ulceration, persistent left lower extremity ulceration. He states that home health has not been out since his appointment. He went to [facility] on Tuesday, studies revealed: RIGHT ABI 0.9, TBI 0.6 LEFT ABI 1.1, TBI 0.6 with triphasic flow bilaterally. We will continue his same treatment plan. He has been educated on compression therapy and need for elevation. He will benefit from lymphedema pumps 11/01/17-He is here in follow-up evaluation for left lower extremity ulcer. The right lower extremity remains healed. He has home health services, but they have not been out to see the patient for 2-3 weeks. He states it home health physical therapy changed his dressing yesterday after therapy; he placed Ace wrap compression. We are still waiting for lymphedema pumps, reordered d/t need for company change. 11/08/17-He is here in follow-up evaluation for left lower extremity ulcer. It is improved. Edema is significantly improved with compression therapy. We will continue with same treatment plan and he will follow-up next week. No word regarding lymphedema pumps 11/15/17-He is here in follow-up evaluation for left lower extremity ulcer. He is healed and will be discharged from wound care services. I have reached out to [facility] regarding his lymphedema pumps. They have been unable to reach the patient; the contact number they had with the patient's wife's cell phone and she has not answered any unrecognized calls. Contact should be made today,  trial planned for next week; [facility] will continue to follow 11/27/17 on evaluation today patient has  multiple blistered areas over the right lower extremity his left lower extremity appears to be doing okay. These blistered areas show signs of no infection which is great news. With that being said he did have some necrotic skin overlying which was mechanically debrided away with saline and gauze today without complication. Overall post debridement the wounds appear to be doing better but in general his swelling seems to be increased. This is obviously not good news. I think this is what has given rise to the blisters. 12/04/17 on evaluation today patient presents for follow-up concerning his bilateral lower extremity edema in the right lower extremity ulcers. He has been tolerating the dressing changes without complication. With that being said he has had no real issues with the wraps which is also good news. Overall I'm pleased with the progress he's been making. 12/11/17 on evaluation today patient appears to be doing rather well in regard to his right lateral lower extremity ulcer. He's been tolerating the dressing changes without complication. Fortunately there does not appear to be any evidence of infection at this time. Overall I'm pleased with the progress that is being made. Unfortunately he has been in the hospital due to having what sounds to be a stomach virus/flu fortunately that is starting to get better. 12/18/17 on evaluation today patient actually appears to be doing very well in regard to his bilateral lower extremities the swelling is under fairly good control his lymphedema pumps are still not up and running quite yet. With that being said he does have several areas of opening noted as far as wounds are concerned mainly over the left lower extremity. With that being said I do believe once he gets lymphedema pumps this would least help you mention some the fluid and preventing  this from occurring. Hopefully that will be set up soon sleeves are Artie in place at his home he just waiting for the machine. 12/25/17 on evaluation today patient actually appears to be doing excellent in fact all of his ulcers appear to have resolved his CORRAN, LALONE. ([id number]) legs appear very well. I do think he needs compression stockings we have discussed this and they are actually going to go to [place] today to elastic therapy to get this fitted for him. I think that is definitely a good thing to do. Readmission: 04/09/18 upon evaluation today this patient is seen for readmission due to bilateral lower extremity lymphedema. He has significant swelling of his extremities especially on the left although the right is also swollen he has weeping from both sides. There are no obvious open wounds at this point. Fortunately he has been doing fairly well for quite a bit of time since I last saw him. Nonetheless unfortunately this seems to have reopened and is giving quite a bit of trouble. He states this began about a week ago when he first called [place] to get in to be seen. No fevers, chills, nausea, or vomiting noted at this time. He has not been using his lymphedema pumps due to the fact that they won't fit on his leg at this point likewise is also not been using his compression for essentially the same reason. 04/16/18 upon evaluation today patient actually appears to be doing a little better in regard to the fluid in his bilateral lower extremities. With that being said he's had three falls since I saw him last week. He also states that he's been feeling very poorly. I was concerned last week and feel like that  the concern is still there as far as the congestion in his chest is concerned he seems to be breathing about the same as last week but again he states he's very weak he's not even able to walk further than from the chair to the door. His wife had to buy a wheelchair just to be able to  get them out of the house to get to the appointment today. This has me very concerned. 04/23/18 on evaluation today patient actually appears to be doing much better than last week's evaluation. At that time actually had to transport him to the ER via EMS and he subsequently was admitted for acute pulmonary edema, acute renal failure, and acute congestive heart failure. Fortunately he is doing much better. Apparently they did dialyze him and were able to take off roughly 35 pounds of fluid. Nonetheless he is feeling much better both in regard to his breathing and he's able to get around much better at this time compared to previous. Overall I'm very happy with how things are at this time. There does not appear to be any evidence of infection currently. No fevers, chills, nausea, or vomiting noted at this time. Electronic Signature(s) Signed: 04/23/2018 5:24:21 PM By: Worthy Keeler PA-C Entered By: Worthy Keeler on 04/23/2018 13:39:43 Adam Merritt ([id number]) -------------------------------------------------------------------------------- Physical Exam Details Patient Name: Adam Merritt Date of Service: 04/23/2018 12:45 PM Medical Record Number: [id number] Patient Account Number: [id number] Date of Birth/Sex: 1946-04-03 (72 y.o. M) Treating RN: Montey Hora Primary Care Provider: [name] Other Clinician: Referring Provider: [name] Treating Provider/Extender: STONE III, Elbie Statzer [name]: 2 Constitutional Well-nourished and well-hydrated in no acute distress. Respiratory normal breathing without difficulty. clear to auscultation bilaterally. Cardiovascular regular rate and rhythm with normal S1, S2. Psychiatric this patient is able to make decisions and demonstrates good insight into disease process. Alert and Oriented x 3. pleasant and cooperative. Notes Patient's bilateral lower extremities show edema at this point he tells me that he has not had the wrap on  41-2 days due to the fact that it was not wrapped appropriately by home health and subsequently it was hurting him he had to take this off. Nonetheless it does appear that he's doing better in general there are no open wounds or weeping areas which is great news. 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 Electronic Signature(s) Signed: 04/23/2018 5:24:21 PM By: Worthy Keeler PA-C Entered By: Worthy Keeler on 04/23/2018 13:40:21 Adam Merritt ([id number]) -------------------------------------------------------------------------------- Physician Orders Details Patient Name: Adam Merritt Date of Service: 04/23/2018 12:45 PM Medical Record Number: [id number] Patient Account Number: [id number] Date of Birth/Sex: 06-20-1945 (72 y.o. M) Treating RN: Montey Hora Primary Care Provider: [name] Other Clinician: Referring Provider: [name] Treating Provider/Extender: Melburn Hake, Guillermo Nehring [name]: 2 Verbal / Phone Orders: No Diagnosis Coding ICD-10 Coding Code Description L03.115 Cellulitis of right lower limb L03.116 Cellulitis of left lower limb I89.0 Lymphedema, not elsewhere classified E66.9 Obesity, unspecified Wound Cleansing o May Shower, gently pat wound dry prior to applying new dressing. - Please do not get your wraps wet but you can remove the wraps prior to nurse visits and wound clinic visits and shower and wash your legs Skin Barriers/Peri-Wound Care o Moisturizing lotion Secondary Dressing o XtraSorb - on any draining lymphedema Dressing Change Frequency o Other: - twice weekly - on Tuesdays and Fridays. Patient comes to [place] on Tuesdays, [facility] to change  wraps on Fridays Follow-up Appointments o Return Appointment in 1 week. Edema Control o 3 Layer Compression System - Bilateral - may anchor with unna if needed o Compression Pump: Use compression pump on left lower extremity for 30 minutes, twice daily.  - Please use for 1 hour twice daily o Compression Pump: Use compression pump on right lower extremity for 30 minutes, twice daily. - Please use for 1 hour twice daily [place] Nurse may visit PRN to address patientos wound care needs. o FACE TO FACE ENCOUNTER: MEDICARE and MEDICAID PATIENTS: I certify that this patient is under my care and that I had a face-to-face encounter that meets the physician face-to-face encounter requirements with this patient on this date. The encounter with the patient was in whole or in part for the following MEDICAL CONDITION: (primary reason for [place]) MEDICAL NECESSITY: I certify, that based on my findings, NURSING services are a medically necessary home health service. HOME BOUND STATUS: I certify that my clinical findings support that this patient is homebound (i.e., Due to illness or injury, pt requires aid of supportive devices such as crutches, cane, wheelchairs, walkers, the use of special transportation or the assistance of another person to leave their place of residence. There is a normal inability to leave the home and doing so requires considerable and taxing effort. Other absences are for medical reasons / religious services and are infrequent or of short duration when for other reasons). o If current dressing causes regression in wound condition, may D/C ordered dressing product/s and apply Normal Saline Moist Dressing daily until next [place] / Other MD appointment. [place] of regression in wound condition at [place] ([id number]) o Please direct any NON-WOUND related issues/requests for orders to patient's Primary Care Physician Electronic Signature(s) Signed: 04/23/2018 4:52:40 PM By: Montey Hora Signed: 04/23/2018 5:24:21 PM By: Worthy Keeler PA-C Entered By: Montey Hora on 04/23/2018 13:30:45 Adam Merritt  ([id number]) -------------------------------------------------------------------------------- Problem List Details Patient Name: Adam Merritt Date of Service: 04/23/2018 12:45 PM Medical Record Number: [id number] Patient Account Number: [id number] Date of Birth/Sex: [birth year] (72 y.o. M) Treating RN: Montey Hora Primary Care Provider: [name] Other Clinician: Referring Provider: [name] Treating Provider/Extender: Melburn Hake, Lilinoe Acklin [name]: 2 Active Problems ICD-10 Evaluated Encounter Code Description Active Date Today Diagnosis L03.115 Cellulitis of right lower limb 04/09/2018 No Yes L03.116 Cellulitis of left lower limb 04/09/2018 No Yes I89.0 Lymphedema, not elsewhere classified 04/09/2018 No Yes E66.9 Obesity, unspecified 04/09/2018 No Yes Inactive Problems Resolved Problems Electronic Signature(s) Signed: 04/23/2018 5:24:21 PM By: Worthy Keeler PA-C Entered By: Worthy Keeler on 04/23/2018 13:22:55 Adam Merritt ([id number]) -------------------------------------------------------------------------------- Progress Note Details Patient Name: Adam Merritt Date of Service: 04/23/2018 12:45 PM Medical Record Number: [id number] Patient Account Number: [id number] Date of Birth/Sex: 09/13/1945 (72 y.o. M) Treating RN: Montey Hora Primary Care Provider: [name] Other Clinician: Referring Provider: [name] Treating Provider/Extender: Melburn Hake, Buren Havey [name]: 2 Subjective Chief Complaint Information obtained from Patient he is here for evaluation of bilateral lower extremity lymphedema History of Present Illness (HPI) 10/18/17-He is here for initial evaluation of bilateral lower extremity ulcerations in the presence of venous insufficiency and lymphedema. He has been seen by vascular medicine in the past, Dr. Lucky Cowboy, last seen in 2016. He does have a history of abnormal ABIs, which is to be expected given his lymphedema and venous  insufficiency. According to Epic, it appears that all attempts for arterial evaluation and/or angiography were not follow through with by patient. He does have a history of being seen in lymphedema clinic in 2018, stopped going approximately 6 months ago stating "it didn't do any good". He does not have lymphedema pumps, he does not have custom fit compression wrap/stockings. He is diabetic and his recent A1c last month was 7.6. He admits to chronic bilateral lower extremity pain, no change in pain since blister and ulceration development. He is currently being treated with Levaquin for bronchitis. He has home health and we will continue. 10/25/17-He is here in follow-up evaluation for bilateral lower extremity ulcerationssubtle he remains on Levaquin for bronchitis. Right lower extremity with no evidence of drainage or ulceration, persistent left lower extremity ulceration. He states that home health has not been out since his appointment. He went to [facility] on Tuesday, studies revealed: RIGHT ABI 0.9, TBI 0.6 LEFT ABI 1.1, TBI 0.6 with triphasic flow bilaterally. We will continue his same treatment plan. He has been educated on compression therapy and need for elevation. He will benefit from lymphedema pumps 11/01/17-He is here in follow-up evaluation for left lower extremity ulcer. The right lower extremity remains healed. He has home health services, but they have not been out to see the patient for 2-3 weeks. He states it home health physical therapy changed his dressing yesterday after therapy; he placed Ace wrap compression. We are still waiting for lymphedema pumps, reordered d/t need for company change. 11/08/17-He is here in follow-up evaluation for left lower extremity ulcer. It is improved. Edema is significantly improved with compression therapy. We will continue with same treatment plan and he will follow-up next week. No word regarding lymphedema pumps 11/15/17-He is  here in follow-up evaluation for left lower extremity ulcer. He is healed and will be discharged from wound care services. I have reached out to [facility] regarding his lymphedema pumps. They have been unable to reach the patient; the contact number they had with the patient's wife's cell phone and she has not answered any unrecognized calls. Contact should be made today, trial planned for next week; [facility] will continue to follow 11/27/17 on evaluation today patient has multiple blistered areas over the right lower extremity his left lower extremity appears to be doing okay. These blistered areas show signs of no infection which is great news. With that being said he did have some necrotic skin overlying which was mechanically debrided away with saline and gauze today without complication. Overall post debridement the wounds appear to be doing better but in general his swelling seems to be increased. This is obviously not good news. I think this is what has given rise to the blisters. 12/04/17 on evaluation today patient presents for follow-up concerning his bilateral lower extremity edema in the right lower extremity ulcers. He has been tolerating the dressing changes without complication. With that being said he has had no real issues with the wraps which is also good news. Overall I'm pleased with the progress he's been making. 12/11/17 on evaluation today patient appears to be doing rather well in regard to his right lateral lower extremity ulcer. He's been tolerating the dressing changes without complication. Fortunately there does not appear to be any evidence of infection at this time. Overall I'm pleased with the progress that is being made. Unfortunately he has been in the hospital due to having what sounds to be a stomach virus/flu fortunately  that is starting to get better. 12/18/17 on evaluation today patient actually appears to be doing very well in regard to his bilateral  lower extremities the JIMMIE, RUETER. ([id number]) swelling is under fairly good control his lymphedema pumps are still not up and running quite yet. With that being said he does have several areas of opening noted as far as wounds are concerned mainly over the left lower extremity. With that being said I do believe once he gets lymphedema pumps this would least help you mention some the fluid and preventing this from occurring. Hopefully that will be set up soon sleeves are Artie in place at his home he just waiting for the machine. 12/25/17 on evaluation today patient actually appears to be doing excellent in fact all of his ulcers appear to have resolved his legs appear very well. I do think he needs compression stockings we have discussed this and they are actually going to go to [place] today to elastic therapy to get this fitted for him. I think that is definitely a good thing to do. Readmission: 04/09/18 upon evaluation today this patient is seen for readmission due to bilateral lower extremity lymphedema. He has significant swelling of his extremities especially on the left although the right is also swollen he has weeping from both sides. There are no obvious open wounds at this point. Fortunately he has been doing fairly well for quite a bit of time since I last saw him. Nonetheless unfortunately this seems to have reopened and is giving quite a bit of trouble. He states this began about a week ago when he first called [place] to get in to be seen. No fevers, chills, nausea, or vomiting noted at this time. He has not been using his lymphedema pumps due to the fact that they won't fit on his leg at this point likewise is also not been using his compression for essentially the same reason. 04/16/18 upon evaluation today patient actually appears to be doing a little better in regard to the fluid in his bilateral lower extremities. With that being said he's had three falls since I saw him last week.  He also states that he's been feeling very poorly. I was concerned last week and feel like that the concern is still there as far as the congestion in his chest is concerned he seems to be breathing about the same as last week but again he states he's very weak he's not even able to walk further than from the chair to the door. His wife had to buy a wheelchair just to be able to get them out of the house to get to the appointment today. This has me very concerned. 04/23/18 on evaluation today patient actually appears to be doing much better than last week's evaluation. At that time actually had to transport him to the ER via EMS and he subsequently was admitted for acute pulmonary edema, acute renal failure, and acute congestive heart failure. Fortunately he is doing much better. Apparently they did dialyze him and were able to take off roughly 35 pounds of fluid. Nonetheless he is feeling much better both in regard to his breathing and he's able to get around much better at this time compared to previous. Overall I'm very happy with how things are at this time. There does not appear to be any evidence of infection currently. No fevers, chills, nausea, or vomiting noted at this time. Patient History Information obtained from Patient. Family History Cancer - Paternal  Grandparents, Kidney Disease - Siblings, Lung Disease - Mother, No family history of Diabetes, Heart Disease, Hereditary Spherocytosis, Hypertension, Seizures, Stroke, Thyroid Problems, Tuberculosis. Social History Never smoker, Marital Status - Married, Alcohol Use - Never, Drug Use - No History, Caffeine Use - Never. Medical History Hospitalization/Surgery History - 11/03/2013, [facility], bronchitis. Medical And Surgical History Notes Respiratory home O2 at night as needed Cardiovascular varicose veins Neurologic CVA - 1992 Review of Systems (ROS) Constitutional Symptoms (General Health) Denies complaints or symptoms of Fever,  Chills. ANSH, FAUBLE ([id number]) Respiratory The patient has no complaints or symptoms. Cardiovascular Complains or has symptoms of LE edema. Psychiatric The patient has no complaints or symptoms. Objective Constitutional Well-nourished and well-hydrated in no acute distress. Vitals Time Taken: 1:03 PM, Height: 66 in, Weight: 323 lbs, BMI: 52.1, Temperature: 97.7 F, Pulse: 71 bpm, Respiratory Rate: 20 breaths/min, Blood Pressure: 162/88 mmHg. Respiratory normal breathing without difficulty. clear to auscultation bilaterally. Cardiovascular regular rate and rhythm with normal S1, S2. Psychiatric this patient is able to make decisions and demonstrates good insight into disease process. Alert and Oriented x 3. pleasant and cooperative. General Notes: Patient's bilateral lower extremities show edema at this point he tells me that he has not had the wrap on 41-2 days due to the fact that it was not wrapped appropriately by home health and subsequently it was hurting him he had to take this off. Nonetheless it does appear that he's doing better in general there are no open wounds or weeping areas which is great news. 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 Assessment Active Problems ICD-10 Cellulitis of right lower limb Cellulitis of left lower limb Lymphedema, not elsewhere classified Obesity, unspecified UZZIAH, RIGG. ([id number]) Procedures There was a Four Layer Compression Therapy Procedure with a pre-treatment ABI of 1.1 by Montey Hora, RN. Post procedure Diagnosis Wound #: Same as Pre-Procedure Plan Wound Cleansing: May Shower, gently pat wound dry prior to applying new dressing. - Please do not get your wraps wet but you can remove the wraps prior to nurse visits and wound clinic visits and shower and wash your legs Skin Barriers/Peri-Wound Care: Moisturizing lotion Secondary Dressing: XtraSorb - on any draining lymphedema Dressing Change  Frequency: Other: - twice weekly - on Tuesdays and Fridays. Patient comes to [facility] on Tuesdays, [facility] to change wraps on Fridays Follow-up Appointments: Return Appointment in 1 week. Edema Control: 3 Layer Compression System - Bilateral - may anchor with unna if needed Compression Pump: Use compression pump on left lower extremity for 30 minutes, twice daily. - Please use for 1 hour twice daily Compression Pump: Use compression pump on right lower extremity for 30 minutes, twice daily. - Please use for 1 hour twice daily Home Health: [place] Nurse may visit PRN to address patient s wound care needs. FACE TO FACE ENCOUNTER: MEDICARE and MEDICAID PATIENTS: I certify that this patient is under my care and that I had a face-to-face encounter that meets the physician face-to-face encounter requirements with this patient on this date. The encounter with the patient was in whole or in part for the following MEDICAL CONDITION: (primary reason for [place]) MEDICAL NECESSITY: I certify, that based on my findings, NURSING services are a medically necessary home health service. HOME BOUND STATUS: I certify that my clinical findings support that this patient is homebound (i.e., Due to illness or injury, pt requires aid of supportive devices such as crutches, cane,  wheelchairs, walkers, the use of special transportation or the assistance of another person to leave their place of residence. There is a normal inability to leave the home and doing so requires considerable and taxing effort. Other absences are for medical reasons / religious services and are infrequent or of short duration when for other reasons). If current dressing causes regression in wound condition, may D/C ordered dressing product/s and apply Normal Saline Moist Dressing daily until next [place] / Other MD appointment. [place] of regression  in wound condition at [phone]. Please direct any NON-WOUND related issues/requests for orders to patient's Primary Care Physician My suggestion currently is going to be for [place] to continue with the above wound care measures for the next week. The patient is in agreement the plan. We will subsequently see were things stand at follow-up. If anything changes or worsens meantime the patient will contact the office and let me know otherwise my hope is that you will continue to show signs of ongoing improvement over the next week he'll probably be ready for discharge. He does need to make his appointment with the [facility] hospital for the Velcro compression wraps as I think this is going to be important to help prevent further fluid buildup and weeping in the future. He understands the states he will call them today that's what I asked him to do. RANDON, SOMERA ([id number]) Please see above for specific wound care orders. We will see patient for re-evaluation in 1 week(s) here in the clinic. If anything worsens or changes patient will contact our office for additional recommendations. Electronic Signature(s) Signed: 04/23/2018 5:24:21 PM By: Worthy Keeler PA-C Entered By: Worthy Keeler on 04/23/2018 13:41:13 Adam Merritt ([id number]) -------------------------------------------------------------------------------- ROS/PFSH Details Patient Name: Adam Merritt Date of Service: 04/23/2018 12:45 PM Medical Record Number: [id number] Patient Account Number: [id number] Date of Birth/Sex: July 23, 1945 (72 y.o. M) Treating RN: Montey Hora Primary Care Provider: [name] Other Clinician: Referring Provider: [name] Treating Provider/Extender: Melburn Hake, Kadarrius Yanke [name]: 2 Information Obtained From Patient Wound History Do you currently have one or more open woundso No Have you tested positive for osteomyelitis (bone infection)o No Have you had any tests for circulation on your  legso Yes Constitutional Symptoms (General Health) Complaints and Symptoms: Negative for: Fever; Chills Cardiovascular Complaints and Symptoms: Positive for: LE edema Medical History: Positive for: Congestive Heart Failure; Coronary Artery Disease; Hypertension; Peripheral Venous Disease Negative for: Angina; Arrhythmia; Deep Vein Thrombosis; Hypotension; Myocardial Infarction; Peripheral Arterial Disease; Phlebitis; Vasculitis Past Medical History Notes: varicose veins Eyes Medical History: Negative for: Cataracts; Glaucoma; Optic Neuritis Ear/Nose/Mouth/Throat Medical History: Negative for: Chronic sinus problems/congestion; Middle ear problems Hematologic/Lymphatic Medical History: Positive for: Anemia - aplastic anemia; Lymphedema Negative for: Hemophilia; Human Immunodeficiency Virus; Sickle Cell Disease Respiratory Complaints and Symptoms: No Complaints or Symptoms Medical History: Positive for: Chronic Obstructive Pulmonary Disease (COPD); Sleep Apnea Negative for: Aspiration; Asthma; Pneumothorax; Tuberculosis Past Medical History Notes: home O2 at night as needed ANDREW, BLASIUS ([id number]) Gastrointestinal Medical History: Negative for: Cirrhosis ; Colitis; Crohnos; Hepatitis A; Hepatitis B; Hepatitis C Endocrine Medical History: Positive for: Type II Diabetes Negative for: Type I Diabetes Time with diabetes: since 2006 Treated with: Insulin Blood sugar tested every day: Yes Tested : Blood sugar testing results: Breakfast: 209 Genitourinary Medical History: Negative for: End Stage Renal Disease Immunological Medical History: Negative for: Lupus Erythematosus; Raynaudos; Scleroderma Integumentary (Skin) Medical History: Negative for: History of Burn; History  of pressure wounds Musculoskeletal Medical History: Positive for: Osteoarthritis Negative for: Gout; Rheumatoid Arthritis; Osteomyelitis Neurologic Medical History: Positive for:  Neuropathy Negative for: Dementia; Quadriplegia; Paraplegia; Seizure Disorder Past Medical History Notes: CVA - 1992 Oncologic Medical History: Positive for: Received Chemotherapy - last dose 2006 Negative for: Received Radiation Psychiatric Complaints and Symptoms: No Complaints or Symptoms Medical History: Negative for: Anorexia/bulimia; Confinement Anxiety ZARION, OLIFF ([id number]) Immunizations Pneumococcal Vaccine: Received Pneumococcal Vaccination: Yes Implantable Devices Hospitalization / Surgery History Name of Hospital Purpose of Hospitalization/Surgery Date [facility] bronchitis 11/03/2013 Family and Social History Cancer: Yes - Paternal Grandparents; Diabetes: No; Heart Disease: No; Hereditary Spherocytosis: No; Hypertension: No; Kidney Disease: Yes - Siblings; Lung Disease: Yes - Mother; Seizures: No; Stroke: No; Thyroid Problems: No; Tuberculosis: No; Never smoker; Marital Status - Married; Alcohol Use: Never; Drug Use: No History; Caffeine Use: Never; Financial Concerns: No; Food, Clothing or Shelter Needs: No; Support System Lacking: No; Transportation Concerns: No; Advanced Directives: No; Patient does not want information on Advanced Directives Physician Affirmation I have reviewed and agree with the above information. Electronic Signature(s) Signed: 04/23/2018 4:52:40 PM By: Montey Hora Signed: 04/23/2018 5:24:21 PM By: Worthy Keeler PA-C Entered By: Worthy Keeler on 04/23/2018 13:39:59 Adam Merritt ([id number]) -------------------------------------------------------------------------------- SuperBill Details Patient Name: Adam Merritt Date of Service: 04/23/2018 Medical Record Number: [id number] Patient Account Number: [id number] Date of Birth/Sex: 12/25/1945 (72 y.o. M) Treating RN: Montey Hora Primary Care Provider: [name] Other Clinician: Referring Provider: [name] Treating Provider/Extender: Melburn Hake, Raistlin Gum [name]:  2 Diagnosis Coding ICD-10 Codes Code Description L03.115 Cellulitis of right lower limb L03.116 Cellulitis of left lower limb I89.0 Lymphedema, not elsewhere classified E66.9 Obesity, unspecified Facility Procedures CPT4: Description Modifier Quantity Code 59458592 [id number] BILATERAL: Application of multi-layer venous compression system; leg (below 1 knee), including ankle and foot. Physician Procedures CPT4 Code: 2863817 Description: 71165 - WC PHYS LEVEL 4 - EST PT ICD-10 Diagnosis Description L03.115 Cellulitis of right lower limb L03.116 Cellulitis of left lower limb I89.0 Lymphedema, not elsewhere classified E66.9 Obesity, unspecified Modifier: Quantity: 1 Electronic Signature(s) Signed: 04/23/2018 5:24:21 PM By: Worthy Keeler PA-C Entered By: Worthy Keeler on 04/23/2018 13:41:26

## 2018-04-26 NOTE — Progress Notes (Signed)
HYMAN, CROSSAN ([id number]) Visit Report for 04/23/2018 Arrival Information Details Patient Name: Adam Merritt, Adam Merritt Date of Service: 04/23/2018 12:45 PM Medical Record Number: [id number] Patient Account Number: [id number] Date of Birth/Sex: [birth year] (72 y.o. M) Treating RN: Cornell Barman Primary Care Jacen Carlini: [name] Other Clinician: Referring Edell Mesenbrink: [name] Treating Alexi Geibel/Extender: Melburn Hake, [name]: 2 Visit Information History Since Last Visit Added or deleted any medications: No Patient Arrived: Ambulatory Any new allergies or adverse reactions: No Arrival Time: 13:02 Had a fall or experienced change in No Accompanied By: wife activities of daily living that may affect Transfer Assistance: None risk of falls: Patient Identification Verified: Yes Signs or symptoms of abuse/neglect since last visito No Secondary Verification Process Yes Hospitalized since last visit: No Completed: Implantable device outside of the clinic excluding No Patient Has Alerts: Yes cellular tissue based products placed in the center Patient Alerts: Patient on Blood since last visit: Thinner Has Dressing in Place as Prescribed: No Aspirin 325mg  Pain Present Now: No Type II Diabetes ABI 5/19 L 1.1 R 0.9 TBI L 0.6 R 0.6 Electronic Signature(s) Signed: 04/24/2018 5:21:06 PM By: Gretta Cool, BSN, RN, CWS, Kim RN, BSN Entered By: Gretta Cool, BSN, RN, CWS, Kim on 04/23/2018 13:02:32 Adam Merritt ([id number]) -------------------------------------------------------------------------------- Compression Therapy Details Patient Name: Adam Merritt Date of Service: 04/23/2018 12:45 PM Medical Record Number: [id number] Patient Account Number: [id number] Date of Birth/Sex: 01-Feb-1946 (72 y.o. M) Treating RN: Montey Hora Primary Care Sapna Padron: [name] Other Clinician: Referring Abigael Mogle: [name] Treating Hasina Kreager/Extender: Melburn Hake, [name]: 2 Compression  Therapy Performed for Wound Assessment: NonWound Condition Lymphedema - Bilateral Leg Performed By: Clinician Montey Hora, RN Compression Type: Four Layer Pre Treatment ABI: 1.1 Post Procedure Diagnosis Same as Pre-procedure Electronic Signature(s) Signed: 04/23/2018 4:52:40 PM By: Montey Hora Entered By: Montey Hora on 04/23/2018 13:37:57 Adam Merritt ([id number]) -------------------------------------------------------------------------------- Encounter Discharge Information Details Patient Name: Adam Merritt Date of Service: 04/23/2018 12:45 PM Medical Record Number: [id number] Patient Account Number: [id number] Date of Birth/Sex: 05-21-1946 (72 y.o. M) Treating RN: Montey Hora Primary Care Antanasia Kaczynski: [name] Other Clinician: Referring Matias Thurman: [name] Treating Tykira Wachs/Extender: Melburn Hake, [name]: 2 Encounter Discharge Information Items Discharge Condition: Stable Ambulatory Status: Ambulatory Discharge Destination: Home Transportation: Private Auto Accompanied By: wife Schedule Follow-up Appointment: Yes Clinical Summary of Care: Patient Declined Electronic Signature(s) Signed: 04/23/2018 4:52:40 PM By: Montey Hora Entered By: Montey Hora on 04/23/2018 13:41:23 Adam Merritt ([id number]) -------------------------------------------------------------------------------- Lower Extremity Assessment Details Patient Name: Adam Merritt Date of Service: 04/23/2018 12:45 PM Medical Record Number: [id number] Patient Account Number: [id number] Date of Birth/Sex: [birth year] (72 y.o. M) Treating RN: Cornell Barman Primary Care Lizzie Cokley: [name] Other Clinician: Referring Shandreka Dante: [name] Treating Bernadette Gores/Extender: Melburn Hake, [name]: 2 Edema Assessment Assessed: [Left: No] [Right: No] [Left: Edema] [Right: :] Calf Left: Right: Point of Measurement: 32 cm From Medial Instep 59.5 cm 55.5 cm Ankle Left:  Right: Point of Measurement: 11 cm From Medial Instep 39 cm 35 cm Vascular Assessment Pulses: Dorsalis Pedis Palpable: [Left:Yes] [Right:Yes] Posterior Tibial Extremity colors, hair growth, and conditions: Extremity Color: [Left:Hyperpigmented] [Right:Hyperpigmented] Hair Growth on Extremity: [Left:No] [Right:No] Temperature of Extremity: [Right:Warm] Capillary Refill: [Left:< 3 seconds] [Right:< 3 seconds] Toe Nail Assessment Left: Right: Thick: Yes Yes Discolored: Yes Yes Deformed: Yes Yes Improper Length and Hygiene: Yes Yes Electronic Signature(s) Signed: 04/24/2018 5:21:06 PM By: Gretta Cool, BSN, RN, CWS, Kim RN, BSN Entered By: Gretta Cool,  BSN, RN, CWS, Kim on 04/23/2018 13:07:48 Adam Merritt, Adam Merritt ([id number]) -------------------------------------------------------------------------------- Multi Wound Chart Details Patient Name: Adam Merritt, Adam Merritt Date of Service: 04/23/2018 12:45 PM Medical Record Number: [id number] Patient Account Number: [id number] Date of Birth/Sex: Jun 12, 1945 (72 y.o. M) Treating RN: Montey Hora Primary Care Tyson Masin: [name] Other Clinician: Referring Arjan Strohm: [name] Treating Kyasia Steuck/Extender: Melburn Hake, [name]: 2 Vital Signs Height(in): 66 Pulse(bpm): 71 Weight(lbs): 323 Blood Pressure(mmHg): 162/88 Body Mass Index(BMI): 52 Temperature(F): 97.7 Respiratory Rate 20 (breaths/min): Wound Assessments Treatment Notes Electronic Signature(s) Signed: 04/23/2018 4:52:40 PM By: Montey Hora Entered By: Montey Hora on 04/23/2018 13:29:41 Adam Merritt ([id number]) -------------------------------------------------------------------------------- [place] Details Patient Name: Adam Merritt Date of Service: 04/23/2018 12:45 PM Medical Record Number: [id number] Patient Account Number: [id number] Date of Birth/Sex: 1945-08-20 (72 y.o. M) Treating RN: Cornell Barman Primary Care Sheppard Luckenbach: [name] Other  Clinician: Referring Sanav Remer: [name] Treating Trenice Mesa/Extender: Melburn Hake, [name]: 2 Active Inactive ` Abuse / Safety / Falls / Self Care Management Nursing Diagnoses: Potential for falls Goals: Patient will remain injury free related to falls Date Initiated: 04/09/2018 Target Resolution Date: 07/13/2018 Goal Status: Active Interventions: Assess fall risk on admission and as needed Notes: ` Orientation to the Wound Care Program Nursing Diagnoses: Knowledge deficit related to the wound healing center program Goals: Patient/caregiver will verbalize understanding of the [place] Program Date Initiated: 04/09/2018 Target Resolution Date: 07/13/2018 Goal Status: Active Interventions: Provide education on orientation to the wound center Notes: ` Venous Leg Ulcer Nursing Diagnoses: Actual venous Insuffiency (use after diagnosis is confirmed) Goals: Patient will maintain optimal edema control Date Initiated: 04/09/2018 Target Resolution Date: 07/13/2018 Goal Status: Active Interventions: Adam Merritt, Adam Merritt ([id number]) Assess peripheral edema status every visit. Compression as ordered Provide education on venous insufficiency Notes: Electronic Signature(s) Signed: 04/23/2018 4:52:40 PM By: Montey Hora Signed: 04/24/2018 5:21:06 PM By: Gretta Cool, BSN, RN, CWS, Kim RN, BSN Entered By: Montey Hora on 04/23/2018 13:29:33 Adam Merritt, Adam Merritt ([id number]) -------------------------------------------------------------------------------- Pain Assessment Details Patient Name: Adam Merritt Date of Service: 04/23/2018 12:45 PM Medical Record Number: [id number] Patient Account Number: [id number] Date of Birth/Sex: [birth year] (72 y.o. M) Treating RN: Cornell Barman Primary Care Nechemia Chiappetta: [name] Other Clinician: Referring Wendel Homeyer: [name] Treating Merriel Zinger/Extender: Melburn Hake, [name]: 2 Active Problems Location of Pain Severity and  Description of Pain Patient Has Paino No Site Locations With Dressing Change: No Pain Management and Medication Current Pain Management: Electronic Signature(s) Signed: 04/24/2018 5:21:06 PM By: Gretta Cool, BSN, RN, CWS, Kim RN, BSN Entered By: Gretta Cool, BSN, RN, CWS, Kim on 04/23/2018 13:02:39 Adam Merritt ([id number]) -------------------------------------------------------------------------------- Patient/Caregiver Education Details Patient Name: Adam Merritt Date of Service: 04/23/2018 12:45 PM Medical Record Number: [id number] Patient Account Number: [id number] Date of Birth/Gender: Jun 17, 1945 (72 y.o. M) Treating RN: Montey Hora Primary Care Physician: [name] Other Clinician: Referring Physician: [name] Treating Physician/Extender: Sharalyn Ink in Treatment: 2 Education Assessment Education Provided To: Patient Education Topics Provided Venous: Handouts: Controlling Swelling with Compression Stockings Methods: Demonstration, Explain/Verbal Responses: State content correctly Electronic Signature(s) Signed: 04/23/2018 4:52:40 PM By: Montey Hora Entered By: Montey Hora on 04/23/2018 13:41:31 Adam Merritt ([id number]) -------------------------------------------------------------------------------- [place] Details Patient Name: Adam Merritt Date of Service: 04/23/2018 12:45 PM Medical Record Number: [id number] Patient Account Number: [id number] Date of Birth/Sex: 07-04-1945 (72 y.o. M) Treating RN: Cornell Barman Primary Care Aneya Daddona: [name] Other Clinician: Referring Minnetta Sandora: [name] Treating Dalanie Kisner/Extender: Melburn Hake,  [name]: 2 Vital Signs Time Taken: 13:03 Temperature (F): 97.7 Height (in): 66 Pulse (bpm): 71 Weight (lbs): 323 Respiratory Rate (breaths/min): 20 Body Mass Index (BMI): 52.1 Blood Pressure (mmHg): 162/88 Reference Range: 80 - 120 mg / dl Electronic Signature(s) Signed: 04/24/2018 5:21:06 PM  By: Gretta Cool, BSN, RN, CWS, Kim RN, BSN Entered By: Gretta Cool, BSN, RN, CWS, Kim on 04/23/2018 13:03:50

## 2018-04-30 ENCOUNTER — Encounter: Payer: Medicare Other | Admitting: Family Medicine

## 2018-04-30 DIAGNOSIS — E11622 Type 2 diabetes mellitus with other skin ulcer: Secondary | ICD-10-CM | POA: Diagnosis not present

## 2018-05-01 ENCOUNTER — Encounter: Payer: Self-pay | Admitting: Nurse Practitioner

## 2018-05-01 ENCOUNTER — Ambulatory Visit (INDEPENDENT_AMBULATORY_CARE_PROVIDER_SITE_OTHER): Payer: Medicare Other | Admitting: Nurse Practitioner

## 2018-05-01 ENCOUNTER — Other Ambulatory Visit: Payer: Self-pay

## 2018-05-01 VITALS — BP 148/74 | HR 78 | Resp 16 | Ht 66.0 in | Wt 307.0 lb

## 2018-05-01 DIAGNOSIS — G894 Chronic pain syndrome: Secondary | ICD-10-CM | POA: Diagnosis not present

## 2018-05-01 DIAGNOSIS — Z0001 Encounter for general adult medical examination with abnormal findings: Secondary | ICD-10-CM | POA: Diagnosis not present

## 2018-05-01 DIAGNOSIS — R300 Dysuria: Secondary | ICD-10-CM | POA: Diagnosis not present

## 2018-05-01 DIAGNOSIS — E1142 Type 2 diabetes mellitus with diabetic polyneuropathy: Secondary | ICD-10-CM | POA: Diagnosis not present

## 2018-05-01 DIAGNOSIS — I10 Essential (primary) hypertension: Secondary | ICD-10-CM

## 2018-05-01 DIAGNOSIS — I890 Lymphedema, not elsewhere classified: Secondary | ICD-10-CM | POA: Diagnosis not present

## 2018-05-01 MED ORDER — POTASSIUM CHLORIDE CRYS ER 20 MEQ PO TBCR: 20.0000 meq | EXTENDED_RELEASE_TABLET | Freq: Two times a day (BID) | ORAL | 3 refills | Status: DC

## 2018-05-01 MED ORDER — GABAPENTIN 300 MG PO CAPS: 300.0000 mg | ORAL_CAPSULE | Freq: Two times a day (BID) | ORAL | 3 refills | Status: DC

## 2018-05-01 MED ORDER — FENTANYL 50 MCG/HR TD PT72: 50.0000 ug | MEDICATED_PATCH | TRANSDERMAL | 0 refills | Status: DC

## 2018-05-01 MED ORDER — ONETOUCH DELICA LANCETS 33G MISC: 3 refills | Status: DC

## 2018-05-01 MED ORDER — ONETOUCH DELICA LANCING DEV MISC: 0 refills | Status: DC

## 2018-05-01 NOTE — Progress Notes (Signed)
[facility] [place], [place] [address]  Internal MEDICINE  Office Visit Note  Patient Name: ROMAR WOODRICK  [id number]  [id number]  Date of Service: 05/05/2018   Pt is here for routine health maintenance examination   Chief Complaint  Patient presents with  . Hypertension  . Diabetes  . Hyperlipidemia  . Annual Exam     The patient is here for routine health maintenance exam. He states that he has been having increase pain in lower legs. Has been having a hard time with using fentanyl patch. States that they are really not sticking to his skin. When it falls off, he will use a new patch when the old one falls off. He state that he is using them up faster than he should be and they do not lat for the full 30 days.  He is also seeing wound care for his lower legs. Legs are less red and swollen. They are currently wrapped in clean dry dressings.    Current Medication: Outpatient Encounter Medications as of 05/01/2018  Medication Sig Note  . albuterol (PROVENTIL HFA;VENTOLIN HFA) 108 (90 Base) MCG/ACT inhaler INHALE 1 PUFF INTO THE LUNGS EVERY 6 HOURS AS NEEDED FOR WHEEZING OR SHORTNESS OF BREATH (Patient taking differently: Inhale 1 puff into the lungs every 6 (six) hours as needed for wheezing or shortness of breath. )   . aspirin 325 MG EC tablet Take 325 mg by mouth daily.   Marland Kitchen atorvastatin (LIPITOR) 40 MG tablet Take 40 mg by mouth at bedtime.    . carvedilol (COREG) 25 MG tablet Take 25 mg by mouth 2 (two) times daily with a meal.   . colchicine 0.6 MG tablet Take 1 tablet (0.6 mg total) by mouth daily.   Marland Kitchen docusate sodium (COLACE) 50 MG capsule Take 50 mg by mouth 2 (two) times daily.   . febuxostat (ULORIC) 40 MG tablet Take 1 tablet (40 mg total) by mouth daily.   . fentaNYL (DURAGESIC - DOSED MCG/HR) 50 MCG/HR Place 1 patch (50 mcg total) onto the skin every 3 (three) days.   . furosemide (LASIX) 40 MG tablet Take 1 tablet (40 mg total) by mouth  daily.   Marland Kitchen gabapentin (NEURONTIN) 300 MG capsule Take 1 capsule (300 mg total) by mouth 2 (two) times daily.   . hydrALAZINE (APRESOLINE) 25 MG tablet TAKE 1 TABLET BY MOUTH THREE TIMES DAILY FOR BLOOD PRESSURE (Patient taking differently: Take 25 mg by mouth 3 (three) times daily. )   . ibuprofen (ADVIL,MOTRIN) 600 MG tablet Take 600 mg by mouth 2 (two) times daily as needed for mild pain or moderate pain.    . indapamide (LOZOL) 2.5 MG tablet Take 1 tablet (2.5 mg total) by mouth daily.   . Insulin Glargine (BASAGLAR KWIKPEN) 100 UNIT/ML SOPN INJECT 30 TO 36 UNITS UNDER THE SKIN EVERY DAY (Patient taking differently: Inject 30-36 Units into the skin daily. )   . ipratropium-albuterol (DUONEB) 0.5-2.5 (3) MG/3ML SOLN Take 3 mLs by nebulization every 4 (four) hours as needed (for shortness of breath).   . liraglutide (VICTOZA) 18 MG/3ML SOPN Inject 0.3 mLs (1.8 mg total) into the skin daily.   . metolazone (ZAROXOLYN) 2.5 MG tablet Take 1 tablet (2.5 mg total) by mouth daily.   . Multiple Vitamin (MULTIVITAMIN WITH MINERALS) TABS tablet Take 1 tablet by mouth daily.   Marland Kitchen omeprazole (PRILOSEC) 20 MG capsule Take 20 mg by mouth daily.   . Oxycodone HCl 10  MG TABS Half to one tab as needed prn for pain (Patient taking differently: Half to one tab  prn for pain)   . pentoxifylline (TRENTAL) 400 MG CR tablet TAKE 1 TABLET BY MOUTH THREE TIMES DAILY FOR CLAUDICATION (Patient taking differently: Take 400 mg by mouth 3 (three) times daily. )   . sennosides-docusate sodium (SENOKOT-S) 8.6-50 MG tablet Take 1-2 tablets by mouth 2 (two) times daily.   Marland Kitchen UREA 20 INTENSIVE HYDRATING 20 % cream USE AS DIRECTED 3 TIMES A WEEK   . zolpidem (AMBIEN CR) 12.5 MG CR tablet Take 1 tablet (12.5 mg total) by mouth at bedtime as needed for sleep.   . [DISCONTINUED] fentaNYL (DURAGESIC - DOSED MCG/HR) 50 MCG/HR Place 1 patch (50 mcg total) onto the skin every 3 (three) days. 04/16/2018: Patient currently wearing 1 patch which  was applied 04/15/2018  . [DISCONTINUED] gabapentin (NEURONTIN) 300 MG capsule Take 1 capsule (300 mg total) by mouth 2 (two) times daily.   . [DISCONTINUED] potassium chloride (KLOR-CON) 20 MEQ packet Take 20 mEq by mouth 2 (two) times daily. Take it only with lasix   . potassium chloride SA (K-DUR,KLOR-CON) 20 MEQ tablet Take 1 tablet (20 mEq total) by mouth 2 (two) times daily.   . [DISCONTINUED] potassium chloride SA (K-DUR,KLOR-CON) 20 MEQ tablet Take 20 mEq by mouth 2 (two) times daily.    No facility-administered encounter medications on file as of 05/01/2018.     Surgical History: Past Surgical History:  Procedure Laterality Date  . CHOLECYSTECTOMY    . CORONARY ARTERY BYPASS GRAFT      Medical History: Past Medical History:  Diagnosis Date  . Anemia   . CAD (coronary artery disease)   . Chest pain   . Coronary artery disease   . Diabetes mellitus without complication ([place])   . Diastolic CHF ([place])   . Esophageal reflux   . Herpes zoster without mention of complication   . Hyperlipidemia   . Hypertension   . Insomnia   . Lumbago   . Lymphedema   . Neuropathy in diabetes ([place])   . Obstructive chronic bronchitis without exacerbation ([place])   . Other malaise and fatigue   . Stroke ([place])   . Varicose veins     Family History: Family History  Problem Relation Age of Onset  . Diabetes Mother   . Hyperlipidemia Mother   . Hypertension Mother   . Diabetes Father   . Hyperlipidemia Father   . Hypertension Father   . CAD Father       Review of Systems  Constitutional: Positive for activity change and fatigue.  HENT: Negative for congestion and postnasal drip.   Eyes: Negative.   Respiratory: Positive for shortness of breath. Negative for cough, chest tightness and wheezing.   Cardiovascular: Positive for leg swelling. Negative for chest pain and palpitations.       Elevated blood pressure today.   Gastrointestinal: Negative for constipation, diarrhea, nausea  and vomiting.  Endocrine: Negative for cold intolerance, heat intolerance, polydipsia and polyuria.       Blood sugars doing well   Musculoskeletal: Positive for arthralgias, back pain and myalgias.  Skin: Positive for rash and wound.       Skin of both lower legs is very red and warm. Reports that wounds are starting to drain clear fluid .  Allergic/Immunologic: Positive for environmental allergies.  Neurological: Negative for dizziness, light-headedness and headaches.  Hematological: Negative for adenopathy.  Psychiatric/Behavioral: Positive for dysphoric mood.  Negative for agitation. The patient is nervous/anxious.      Today's Vitals   05/01/18 1510  BP: (!) 148/74  Pulse: 78  Resp: 16  SpO2: 96%  Weight: (!) 307 lb (139.3 kg)  Height: '5\' 6"'  (1.676 m)    Physical Exam  Constitutional: He is oriented to person, place, and time. He appears well-developed and well-nourished. He appears ill. No distress.  HENT:  Head: Normocephalic and atraumatic.  Nose: Nose normal.  Mouth/Throat: No oropharyngeal exudate.  Eyes: Pupils are equal, round, and reactive to light. Conjunctivae and EOM are normal.  Neck: Normal range of motion. Neck supple. No JVD present. No tracheal deviation present. No thyromegaly present.  Cardiovascular: Normal rate, regular rhythm, normal heart sounds and intact distal pulses. Exam reveals no gallop and no friction rub.  No murmur heard. Moderate swelling in bilateral lower legs.   Pulmonary/Chest: Effort normal and breath sounds normal. No respiratory distress. He has no wheezes. He has no rales. He exhibits no tenderness.  Abdominal: Soft. Bowel sounds are normal. There is no tenderness.  Musculoskeletal: Normal range of motion.  Moderate to severe lower leg pain, bilaterally. Worse on the right side, especially after he "twisted it" this morning in the bathroom. Pain is severe from just above the heel and into the calf. Hurts to flex and dorsiflex the right  foot. Strength is diminished. Having very difficult time putting any weight on the right foot.   Lymphadenopathy:    He has no cervical adenopathy.  Neurological: He is alert and oriented to person, place, and time. No cranial nerve deficit.  Skin: Skin is warm and dry. He is not diaphoretic.  The patient's legs are currently wrapped in clean and dry dressings.   Psychiatric: He has a normal mood and affect. His behavior is normal. Judgment and thought content normal.  Nursing note and vitals reviewed.  Depression screen [facility] 2/9 05/01/2018 04/01/2018 02/12/2018 01/28/2018 12/24/2017  Decreased Interest 0 0 0 0 0  Down, Depressed, Hopeless 0 0 0 0 0  PHQ - 2 Score 0 0 0 0 0    Functional Status Survey: Is the patient deaf or have difficulty hearing?: Yes Does the patient have difficulty seeing, even when wearing glasses/contacts?: No Does the patient have difficulty concentrating, remembering, or making decisions?: No Does the patient have difficulty walking or climbing stairs?: Yes Does the patient have difficulty dressing or bathing?: No Does the patient have difficulty doing errands alone such as visiting a doctor's office or shopping?: No  MMSE - [place] Exam 05/01/2018  Orientation to time 5  Orientation to Place 5  Registration 3  Attention/ Calculation 5  Recall 3  Language- name 2 objects 2  Language- repeat 1  Language- follow 3 step command 3  Language- read & follow direction 1  Write a sentence 1  Copy design 1  Total score 30    Fall Risk  05/01/2018 04/01/2018 02/12/2018 01/28/2018 12/24/2017  Falls in the past year? 1 No No No No  Number falls in past yr: 1 - - - -  Injury with Fall? 1 - - - -  Comment - - - - -     LABS: Recent Results (from the past 2160 hour(s))  Basic metabolic panel     Status: Abnormal   Collection Time: 04/16/18  3:36 PM  Result Value Ref Range   Sodium 137 135 - 145 mmol/L   Potassium 3.3 (L) 3.5 - 5.1 mmol/L  Chloride  98 98 - 111 mmol/L   CO2 29 22 - 32 mmol/L   Glucose, Bld 163 (H) 70 - 99 mg/dL   BUN 37 (H) 8 - 23 mg/dL   Creatinine, Ser 2.20 (H) 0.61 - 1.24 mg/dL   Calcium 9.0 8.9 - 10.3 mg/dL   GFR calc non Af Amer 28 (L) >60 mL/min   GFR calc Af Amer 32 (L) >60 mL/min    Comment: (NOTE) The eGFR has been calculated using the CKD EPI equation. This calculation has not been validated in all clinical situations. eGFR's persistently <60 mL/min signify possible Chronic Kidney Disease.    Anion gap 10 5 - 15    Comment: Performed at [facility], [address] [place], [place] [address]  Troponin I - ONCE - STAT     Status: None   Collection Time: 04/16/18  3:36 PM  Result Value Ref Range   Troponin I <0.03 <0.03 ng/mL    Comment: Performed at [facility], [address] [place], [place] [address]  Brain natriuretic peptide     Status: None   Collection Time: 04/16/18  3:36 PM  Result Value Ref Range   B Natriuretic Peptide 29.0 0.0 - 100.0 pg/mL    Comment: Performed at [facility], [address] [place], [place] [address]  CBC with Differential/Platelet     Status: Abnormal   Collection Time: 04/16/18  3:36 PM  Result Value Ref Range   WBC 11.2 (H) 4.0 - 10.5 K/uL   RBC 3.93 (L) 4.22 - 5.81 MIL/uL   Hemoglobin 12.7 (L) 13.0 - 17.0 g/dL   HCT 37.9 (L) 39.0 - 52.0 %   MCV 96.4 80.0 - 100.0 fL   MCH 32.3 26.0 - 34.0 pg   MCHC 33.5 30.0 - 36.0 g/dL   RDW 12.7 11.5 - 15.5 %   Platelets 151 150 - 400 K/uL   nRBC 0.0 0.0 - 0.2 %   Neutrophils Relative % 78 %   Neutro Abs 8.8 (H) 1.7 - 7.7 K/uL   Lymphocytes Relative 12 %   Lymphs Abs 1.3 0.7 - 4.0 K/uL   Monocytes Relative 6 %   Monocytes Absolute 0.6 0.1 - 1.0 K/uL   Eosinophils Relative 3 %   Eosinophils Absolute 0.3 0.0 - 0.5 K/uL   Basophils Relative 0 %   Basophils Absolute 0.1 0.0 - 0.1 K/uL   Immature Granulocytes 1 %   Abs Immature Granulocytes 0.07 0.00 - 0.07 K/uL    Comment: Performed at  [facility], [address] [place], [place] [address]  Urinalysis, Complete w Microscopic     Status: Abnormal   Collection Time: 04/16/18  6:15 PM  Result Value Ref Range   Color, Urine YELLOW (A) YELLOW   APPearance CLEAR (A) CLEAR   Specific Gravity, Urine 1.009 1.005 - 1.030   pH 5.0 5.0 - 8.0   Glucose, UA NEGATIVE NEGATIVE mg/dL   Hgb urine dipstick SMALL (A) NEGATIVE   Bilirubin Urine NEGATIVE NEGATIVE   Ketones, ur NEGATIVE NEGATIVE mg/dL   Protein, ur NEGATIVE NEGATIVE mg/dL   Nitrite NEGATIVE NEGATIVE   Leukocytes, UA NEGATIVE NEGATIVE   WBC, UA 0-5 0 - 5 WBC/hpf   Bacteria, UA RARE (A) NONE SEEN   Squamous Epithelial / LPF NONE SEEN 0 - 5   Mucus PRESENT    Hyaline Casts, UA PRESENT     Comment: Performed at [facility], [address] [place],  [place] [address]  Troponin I - Now Then Q6H     Status: None   Collection Time: 04/16/18  7:19 PM  Result Value Ref Range   Troponin I <0.03 <0.03 ng/mL    Comment: Performed at [facility], [address] [place], [place] [address]  Hemoglobin A1c     Status: Abnormal   Collection Time: 04/16/18  7:19 PM  Result Value Ref Range   Hgb A1c MFr Bld 6.7 (H) 4.8 - 5.6 %    Comment: (NOTE) Pre diabetes:          5.7%-6.4% Diabetes:              >6.4% Glycemic control for   <7.0% adults with diabetes    Mean Plasma Glucose 145.59 mg/dL    Comment: Performed at [facility] [address] [place], [place] [address]  TSH     Status: None   Collection Time: 04/16/18  7:19 PM  Result Value Ref Range   TSH 1.978 0.350 - 4.500 uIU/mL    Comment: Performed by a 3rd Generation assay with a functional sensitivity of <=0.01 uIU/mL. Performed at [facility], [address] [place], [place] [address]   Glucose, capillary     Status: Abnormal   Collection Time: 04/16/18  9:24 PM  Result Value Ref Range   Glucose-Capillary 253 (H) 70 - 99 mg/dL  Troponin I - Now Then Q6H      Status: None   Collection Time: 04/16/18 11:35 PM  Result Value Ref Range   Troponin I <0.03 <0.03 ng/mL    Comment: Performed at [facility], [address] [place], [place] [address]  Troponin I - Now Then Q6H     Status: None   Collection Time: 04/17/18  5:42 AM  Result Value Ref Range   Troponin I <0.03 <0.03 ng/mL    Comment: Performed at [facility], [address] [place], [place] [address]  Basic metabolic panel     Status: Abnormal   Collection Time: 04/17/18  5:42 AM  Result Value Ref Range   Sodium 137 135 - 145 mmol/L   Potassium 3.5 3.5 - 5.1 mmol/L   Chloride 97 (L) 98 - 111 mmol/L   CO2 29 22 - 32 mmol/L   Glucose, Bld 263 (H) 70 - 99 mg/dL   BUN 39 (H) 8 - 23 mg/dL   Creatinine, Ser 1.89 (H) 0.61 - 1.24 mg/dL   Calcium 9.0 8.9 - 10.3 mg/dL   GFR calc non Af Amer 34 (L) >60 mL/min   GFR calc Af Amer 39 (L) >60 mL/min    Comment: (NOTE) The eGFR has been calculated using the CKD EPI equation. This calculation has not been validated in all clinical situations. eGFR's persistently <60 mL/min signify possible Chronic Kidney Disease.    Anion gap 11 5 - 15    Comment: Performed at [facility], [address] [place], [place] [address]  CBC     Status: Abnormal   Collection Time: 04/17/18  5:42 AM  Result Value Ref Range   WBC 10.1 4.0 - 10.5 K/uL   RBC 3.87 (L) 4.22 - 5.81 MIL/uL   Hemoglobin 12.7 (L) 13.0 - 17.0 g/dL   HCT 36.4 (L) 39.0 - 52.0 %   MCV 94.1 80.0 - 100.0 fL   MCH 32.8 26.0 - 34.0 pg   MCHC 34.9 30.0 - 36.0 g/dL   RDW 12.3 11.5 - 15.5 %  Platelets 153 150 - 400 K/uL   nRBC 0.0 0.0 - 0.2 %    Comment: Performed at [facility], [address] [place], [place] [address]  Glucose, capillary     Status: Abnormal   Collection Time: 04/17/18  8:02 AM  Result Value Ref Range   Glucose-Capillary 244 (H) 70 - 99 mg/dL  Glucose, capillary     Status: Abnormal   Collection Time: 04/17/18 11:49 AM  Result  Value Ref Range   Glucose-Capillary 213 (H) 70 - 99 mg/dL  Glucose, capillary     Status: Abnormal   Collection Time: 04/17/18  4:51 PM  Result Value Ref Range   Glucose-Capillary 182 (H) 70 - 99 mg/dL  Glucose, capillary     Status: Abnormal   Collection Time: 04/17/18  9:06 PM  Result Value Ref Range   Glucose-Capillary 255 (H) 70 - 99 mg/dL   Comment 1 Notify RN    Comment 2 Document in Chart   CBC     Status: Abnormal   Collection Time: 04/18/18  3:24 AM  Result Value Ref Range   WBC 12.3 (H) 4.0 - 10.5 K/uL   RBC 3.67 (L) 4.22 - 5.81 MIL/uL   Hemoglobin 11.9 (L) 13.0 - 17.0 g/dL   HCT 34.9 (L) 39.0 - 52.0 %   MCV 95.1 80.0 - 100.0 fL   MCH 32.4 26.0 - 34.0 pg   MCHC 34.1 30.0 - 36.0 g/dL   RDW 12.3 11.5 - 15.5 %   Platelets 141 (L) 150 - 400 K/uL   nRBC 0.0 0.0 - 0.2 %    Comment: Performed at [facility], [address] [place], [place] [address]  Basic metabolic panel     Status: Abnormal   Collection Time: 04/18/18  3:24 AM  Result Value Ref Range   Sodium 138 135 - 145 mmol/L   Potassium 2.9 (L) 3.5 - 5.1 mmol/L   Chloride 93 (L) 98 - 111 mmol/L   CO2 34 (H) 22 - 32 mmol/L   Glucose, Bld 155 (H) 70 - 99 mg/dL   BUN 45 (H) 8 - 23 mg/dL   Creatinine, Ser 1.93 (H) 0.61 - 1.24 mg/dL   Calcium 8.7 (L) 8.9 - 10.3 mg/dL   GFR calc non Af Amer 33 (L) >60 mL/min   GFR calc Af Amer 38 (L) >60 mL/min    Comment: (NOTE) The eGFR has been calculated using the CKD EPI equation. This calculation has not been validated in all clinical situations. eGFR's persistently <60 mL/min signify possible Chronic Kidney Disease.    Anion gap 11 5 - 15    Comment: Performed at [facility], [address] [place], [place] [address]  Magnesium     Status: None   Collection Time: 04/18/18  3:24 AM  Result Value Ref Range   Magnesium 2.2 1.7 - 2.4 mg/dL    Comment: Performed at [facility], [address] [place], [place] [address]  Glucose, capillary      Status: Abnormal   Collection Time: 04/18/18  7:59 AM  Result Value Ref Range   Glucose-Capillary 151 (H) 70 - 99 mg/dL   Comment 1 Notify RN   Glucose, capillary     Status: Abnormal   Collection Time: 04/18/18 11:32 AM  Result Value Ref Range   Glucose-Capillary 182 (H) 70 - 99 mg/dL   Comment 1 Notify RN   UA/M w/rflx Culture, Routine     Status: None  Collection Time: 05/01/18  3:00 PM  Result Value Ref Range   Specific Gravity, UA 1.019 1.005 - 1.030   pH, UA 5.0 5.0 - 7.5   Color, UA Yellow Yellow   Appearance Ur Clear Clear   Leukocytes, UA Negative Negative   Protein, UA Trace Negative/Trace   Glucose, UA Negative Negative   Ketones, UA Negative Negative   RBC, UA Negative Negative   Bilirubin, UA Negative Negative   Urobilinogen, Ur 0.2 0.2 - 1.0 mg/dL   Nitrite, UA Negative Negative   Microscopic Examination Comment     Comment: Microscopic follows if indicated.   Microscopic Examination See below:     Comment: Microscopic was indicated and was performed.   Urinalysis Reflex Comment     Comment: This specimen will not reflex to a Urine Culture.  Microscopic Examination     Status: Abnormal   Collection Time: 05/01/18  3:00 PM  Result Value Ref Range   WBC, UA 0-5 0 - 5 /hpf   RBC, UA None seen 0 - 2 /hpf   Epithelial Cells (non renal) 0-10 0 - 10 /hpf   Casts Present (A) None seen /lpf   Cast Type Hyaline casts N/A   Mucus, UA Present Not Estab.   Bacteria, UA None seen None seen/Few    Assessment/Plan: 1. Encounter for general adult medical examination with abnormal findings Annual health maintenance exam today  2. Chronic pain disorder Will continue fentanyl 66mg transdermal patches. Recommend he clean off the skin with alcohol cleansing pads prior to application. May also use tegaderm bandage to help with adherence. He has agreed with this plna  - fentaNYL (DURAGESIC - DOSED MCG/HR) 50 MCG/HR; Place 1 patch (50 mcg total) onto the skin every 3 (three)  days.  Dispense: 10 patch; Refill: 0  3. Diabetic polyneuropathy associated with type 2 diabetes mellitus ([place] Continue diabetic medication and gabapentin as prescribed. Refilled gabapentin today - gabapentin (NEURONTIN) 300 MG capsule; Take 1 capsule (300 mg total) by mouth 2 (two) times daily.  Dispense: 180 capsule; Refill: 3  4. Lymphedema of both lower extremities Improving. Continue visits with wound care as scheduled   5. Essential hypertension Stable. Continue bp medication as prescribed   6. Dysuria - UA/M w/rflx Culture, Routine  General Counseling: Korbin verbalizes understanding of the findings of todays visit and agrees with plan of treatment. I have discussed any further diagnostic evaluation that may be needed or ordered today. We also reviewed his medications today. he has been encouraged to call the office with any questions or concerns that should arise related to todays visit.    Counseling:  Reviewed risks and possible side effects associated with taking opiates, benzodiazepines and other CNS depressants. Combination of these could cause dizziness and drowsiness. Advised patient not to drive or operate machinery when taking these medications, as patient's and other's life can be at risk and will have consequences. Patient verbalized understanding in this matter. Dependence and abuse for these drugs will be monitored closely. A Controlled substance policy and procedure is on file which allows [facility] to order a urine drug screen test at any visit. Patient understands and agrees with the plan  This patient was seen by HLeretha PolFNP Collaboration with Dr FLavera Guiseas a part of collaborative care agreement  Orders Placed This Encounter  Procedures  . Microscopic Examination  . UA/M w/rflx Culture, Routine    Meds ordered this encounter  Medications  . fentaNYL (DURAGESIC -  DOSED MCG/HR) 50 MCG/HR    Sig: Place 1 patch (50 mcg total) onto the  skin every 3 (three) days.    Dispense:  10 patch    Refill:  0    Order Specific Question:   Supervising Provider    Answer:   [name] [[id number]]  . gabapentin (NEURONTIN) 300 MG capsule    Sig: Take 1 capsule (300 mg total) by mouth 2 (two) times daily.    Dispense:  180 capsule    Refill:  3    Order Specific Question:   Supervising Provider    Answer:   [name] [[id number]]  . potassium chloride SA (K-DUR,KLOR-CON) 20 MEQ tablet    Sig: Take 1 tablet (20 mEq total) by mouth 2 (two) times daily.    Dispense:  60 tablet    Refill:  3    Patient was given packets by accident last visit. Please fill as tablets. Thanks.    Order Specific Question:   Supervising Provider    Answer:   [name] [[id number]]    Time spent: [place], MD  Internal Medicine

## 2018-05-02 LAB — UA/M W/RFLX CULTURE, ROUTINE
Bilirubin, UA: NEGATIVE
Glucose, UA: NEGATIVE
KETONES UA: NEGATIVE
LEUKOCYTES UA: NEGATIVE
Nitrite, UA: NEGATIVE
PH UA: 5 (ref 5.0–7.5)
RBC, UA: NEGATIVE
Specific Gravity, UA: 1.019 (ref 1.005–1.030)
UUROB: 0.2 mg/dL (ref 0.2–1.0)

## 2018-05-02 LAB — MICROSCOPIC EXAMINATION
BACTERIA UA: NONE SEEN
RBC MICROSCOPIC, UA: NONE SEEN /HPF (ref 0–2)

## 2018-05-05 DIAGNOSIS — R300 Dysuria: Secondary | ICD-10-CM | POA: Insufficient documentation

## 2018-05-05 DIAGNOSIS — Z0001 Encounter for general adult medical examination with abnormal findings: Secondary | ICD-10-CM | POA: Insufficient documentation

## 2018-05-06 NOTE — Progress Notes (Addendum)
MARCO, RAPER ([id number]) Visit Report for 04/30/2018 Chief Complaint Document Details Patient Name: Adam Merritt, Adam Merritt Date of Service: 04/30/2018 12:45 PM Medical Record Number: [id number] Patient Account Number: [id number] Date of Birth/Sex: 29-Oct-1945 (72 y.o. M) Treating RN: Montey Hora Primary Care Provider: [name] Other Clinician: Referring Provider: [name] Treating Provider/Extender: Oneida Arenas in Treatment: 3 Information Obtained from: Patient Chief Complaint he is here for evaluation of bilateral lower extremity lymphedema Electronic Signature(s) Signed: 05/05/2018 5:29:42 PM By: Beather Arbour FNP-C Entered By: Beather Arbour on 04/30/2018 14:14:58 Adam Merritt ([id number]) -------------------------------------------------------------------------------- HPI Details Patient Name: Adam Merritt Date of Service: 04/30/2018 12:45 PM Medical Record Number: [id number] Patient Account Number: [id number] Date of Birth/Sex: 1946-01-20 (72 y.o. M) Treating RN: Montey Hora Primary Care Provider: [name] Other Clinician: Referring Provider: [name] Treating Provider/Extender: Oneida Arenas in Treatment: 3 History of Present Illness HPI Description: 10/18/17-He is here for initial evaluation of bilateral lower extremity ulcerations in the presence of venous insufficiency and lymphedema. He has been seen by vascular medicine in the past, Dr. Lucky Cowboy, last seen in 2016. He does have a history of abnormal ABIs, which is to be expected given his lymphedema and venous insufficiency. According to Epic, it appears that all attempts for arterial evaluation and/or angiography were not follow through with by patient. He does have a history of being seen in lymphedema clinic in 2018, stopped going approximately 6 months ago stating "it didn't do any good". He does not have lymphedema pumps, he does not have custom fit compression wrap/stockings. He is  diabetic and his recent A1c last month was 7.6. He admits to chronic bilateral lower extremity pain, no change in pain since blister and ulceration development. He is currently being treated with Levaquin for bronchitis. He has home health and we will continue. 10/25/17-He is here in follow-up evaluation for bilateral lower extremity ulcerationssubtle he remains on Levaquin for bronchitis. Right lower extremity with no evidence of drainage or ulceration, persistent left lower extremity ulceration. He states that home health has not been out since his appointment. [facility], studies revealed: RIGHT ABI 0.9, TBI 0.6 LEFT ABI 1.1, TBI 0.6 with triphasic flow bilaterally. We will continue his same treatment plan. He has been educated on compression therapy and need for elevation. He will benefit from lymphedema pumps 11/01/17-He is here in follow-up evaluation for left lower extremity ulcer. The right lower extremity remains healed. He has home health services, but they have not been out to see the patient for 2-3 weeks. He states it home health physical therapy changed his dressing yesterday after therapy; he placed Ace wrap compression. We are still waiting for lymphedema pumps, reordered d/t need for company change. 11/08/17-He is here in follow-up evaluation for left lower extremity ulcer. It is improved. Edema is significantly improved with compression therapy. We will continue with same treatment plan and he will follow-up next week. No word regarding lymphedema pumps 11/15/17-He is here in follow-up evaluation for left lower extremity ulcer. He is healed and will be discharged from wound care services. I have reached out to [facility] regarding his lymphedema pumps. They have been unable to reach the patient; the contact number they had with the patient's wife's cell phone and she has not answered any unrecognized calls. Contact should be made today,  trial planned for next week; [facility] will continue to follow 11/27/17 on evaluation today patient has multiple blistered areas over  the right lower extremity his left lower extremity appears to be doing okay. These blistered areas show signs of no infection which is great news. With that being said he did have some necrotic skin overlying which was mechanically debrided away with saline and gauze today without complication. Overall post debridement the wounds appear to be doing better but in general his swelling seems to be increased. This is obviously not good news. I think this is what has given rise to the blisters. 12/04/17 on evaluation today patient presents for follow-up concerning his bilateral lower extremity edema in the right lower extremity ulcers. He has been tolerating the dressing changes without complication. With that being said he has had no real issues with the wraps which is also good news. Overall I'm pleased with the progress he's been making. 12/11/17 on evaluation today patient appears to be doing rather well in regard to his right lateral lower extremity ulcer. He's been tolerating the dressing changes without complication. Fortunately there does not appear to be any evidence of infection at this time. Overall I'm pleased with the progress that is being made. Unfortunately he has been in the hospital due to having what sounds to be a stomach virus/flu fortunately that is starting to get better. 12/18/17 on evaluation today patient actually appears to be doing very well in regard to his bilateral lower extremities the swelling is under fairly good control his lymphedema pumps are still not up and running quite yet. With that being said he does have several areas of opening noted as far as wounds are concerned mainly over the left lower extremity. With that being said I do believe once he gets lymphedema pumps this would least help you mention some the fluid and preventing  this from occurring. Hopefully that will be set up soon sleeves are Artie in place at his home he just waiting for the machine. 12/25/17 on evaluation today patient actually appears to be doing excellent in fact all of his ulcers appear to have resolved his CORDERO, SURETTE. ([id number]) legs appear very well. I do think he needs compression stockings we have discussed this and they are actually going to go to  today to elastic therapy to get this fitted for him. I think that is definitely a good thing to do. Readmission: 04/09/18 upon evaluation today this patient is seen for readmission due to bilateral lower extremity lymphedema. He has significant swelling of his extremities especially on the left although the right is also swollen he has weeping from both sides. There are no obvious open wounds at this point. Fortunately he has been doing fairly well for quite a bit of time since I last saw him. Nonetheless unfortunately this seems to have reopened and is giving quite a bit of trouble. He states this began about a week ago when he first called [place] to get in to be seen. No fevers, chills, nausea, or vomiting noted at this time. He has not been using his lymphedema pumps due to the fact that they won't fit on his leg at this point likewise is also not been using his compression for essentially the same reason. 04/16/18 upon evaluation today patient actually appears to be doing a little better in regard to the fluid in his bilateral lower extremities. With that being said he's had three falls since I saw him last week. He also states that he's been feeling very poorly. I was concerned last week and feel like that the concern is still  there as far as the congestion in his chest is concerned he seems to be breathing about the same as last week but again he states he's very weak he's not even able to walk further than from the chair to the door. His wife had to buy a wheelchair just to be able to  get them out of the house to get to the appointment today. This has me very concerned. 04/23/18 on evaluation today patient actually appears to be doing much better than last week's evaluation. At that time actually had to transport him to the ER via EMS and he subsequently was admitted for acute pulmonary edema, acute renal failure, and acute congestive heart failure. Fortunately he is doing much better. Apparently they did dialyze him and were able to take off roughly 35 pounds of fluid. Nonetheless he is feeling much better both in regard to his breathing and he's able to get around much better at this time compared to previous. Overall I'm very happy with how things are at this time. There does not appear to be any evidence of infection currently. No fevers, chills, nausea, or vomiting noted at this time. 04/30/2018 patient seen today for follow-up and management of bilateral lower extremity lymphedema. He did express being more sad today than usual due to the recent loss of his dog. He states that he has been compliant with using the lymphedema pumps. However he does admit that over the last 2-3 days he has not been using the pumps due to the recent loss of his dog. At this time there is no drainage or open wounds to his lower extremities. The left leg edema is measuring smaller today. Still has a significant amount of edema on bilateral lower extremities with dry flaky skin. He will be referred to the lymphedema clinic for further management. Will continue 3 layer compression wraps and follow-up in 1-2 weeks.Denies any pain, fever, chairs recently. No recent falls or injuries reported during this visit. Electronic Signature(s) Signed: 06/02/2018 12:49:36 AM By: Beather Arbour FNP-C Previous Signature: 05/05/2018 5:29:42 PM Version By: Beather Arbour FNP-C Entered By: Beather Arbour on 06/02/2018 00:47:27 Adam Merritt  ([id number]) -------------------------------------------------------------------------------- Physical Exam Details Patient Name: Adam Merritt Date of Service: 04/30/2018 12:45 PM Medical Record Number: [id number] Patient Account Number: [id number] Date of Birth/Sex: 1946/04/05 (72 y.o. M) Treating RN: Montey Hora Primary Care Provider: [name] Other Clinician: Referring Provider: [name] Treating Provider/Extender: Beather Arbour [name]: 3 Constitutional . appears in no distress. Respiratory Respiratory effort is easy and symmetric bilaterally. Rate is normal at rest and on room air.. Cardiovascular Pedal pulses palpable and strong bilaterally.. Extremities are free of varicosities, clubbing or edema. Peripheral pulses strong and equal. Capillary refill < 3 seconds.. Integumentary (Hair, Skin) lymphoedema. Notes 04/30/2018 patient with lower extremity edema with lymphedema. He has home health that comes to assist with wrapping of his legs. He may not always be compliant with the use of the leg wraps or the lymphedema pumps. Happy to see if there is no weeping or new wounds to the lower extremities. Electronic Signature(s) Signed: 05/05/2018 5:29:42 PM By: Beather Arbour FNP-C Entered By: Beather Arbour on 04/30/2018 14:09:02 Adam Merritt ([id number]) -------------------------------------------------------------------------------- Physician Orders Details Patient Name: Adam Merritt Date of Service: 04/30/2018 12:45 PM Medical Record Number: [id number] Patient Account Number: [id number] Date of Birth/Sex: Nov 21, 1945 (72 y.o. M) Treating RN: Montey Hora Primary Care Provider: [name] Other Clinician: Referring Provider: [name] Treating Provider/Extender: Beather Arbour  [name]: 3 Verbal / Phone Orders: No Diagnosis Coding Wound Cleansing o May Shower, gently pat wound dry prior to applying new dressing. - Please do not get  your wraps wet but you can remove the wraps prior to nurse visits and wound clinic visits and shower and wash your legs Skin Barriers/Peri-Wound Care o Moisturizing lotion Secondary Dressing o XtraSorb - on any draining lymphedema Dressing Change Frequency o Other: - twice weekly - on Tuesdays and Fridays. Patient comes to [place] on Tuesdays, [facility] to change wraps on Fridays Follow-up Appointments o Return Appointment in 1 week. Edema Control o 3 Layer Compression System - Bilateral - may anchor with unna if needed o Compression Pump: Use compression pump on left lower extremity for 30 minutes, twice daily. - Please use for 1 hour twice daily o Compression Pump: Use compression pump on right lower extremity for 30 minutes, twice daily. - Please use for 1 hour twice daily [place] Nurse may visit PRN to address patientos wound care needs. o FACE TO FACE ENCOUNTER: MEDICARE and MEDICAID PATIENTS: I certify that this patient is under my care and that I had a face-to-face encounter that meets the physician face-to-face encounter requirements with this patient on this date. The encounter with the patient was in whole or in part for the following MEDICAL CONDITION: (primary reason for [place]) MEDICAL NECESSITY: I certify, that based on my findings, NURSING services are a medically necessary home health service. HOME BOUND STATUS: I certify that my clinical findings support that this patient is homebound (i.e., Due to illness or injury, pt requires aid of supportive devices such as crutches, cane, wheelchairs, walkers, the use of special transportation or the assistance of another person to leave their place of residence. There is a normal inability to leave the home and doing so requires considerable and taxing effort. Other absences are for medical reasons / religious services and are infrequent or  of short duration when for other reasons). o If current dressing causes regression in wound condition, may D/C ordered dressing product/s and apply Normal Saline Moist Dressing daily until next [place] / Other MD appointment. [place] of regression in wound condition at [phone]. o Please direct any NON-WOUND related issues/requests for orders to patient's Primary Care Physician Services and Therapies o Lymphedema Clinic Electronic Signature(s) Signed: 04/30/2018 5:02:25 PM By: Leonides Cave ([id number]) Signed: 05/05/2018 5:29:42 PM By: Beather Arbour FNP-C Entered By: Montey Hora on 04/30/2018 13:12:28 Adam Merritt ([id number]) -------------------------------------------------------------------------------- Progress Note Details Patient Name: Adam Merritt Date of Service: 04/30/2018 12:45 PM Medical Record Number: [id number] Patient Account Number: [id number] Date of Birth/Sex: 15-Aug-1945 (71 y.o. M) Treating RN: Montey Hora Primary Care Provider: [name] Other Clinician: Referring Provider: [name] Treating Provider/Extender: Oneida Arenas in Treatment: 3 Subjective Chief Complaint Information obtained from Patient he is here for evaluation of bilateral lower extremity lymphedema History of Present Illness (HPI) 10/18/17-He is here for initial evaluation of bilateral lower extremity ulcerations in the presence of venous insufficiency and lymphedema. He has been seen by vascular medicine in the past, Dr. Lucky Cowboy, last seen in 2016. He does have a history of abnormal ABIs, which is to be expected given his lymphedema and venous insufficiency. According to Epic, it appears that all attempts for arterial evaluation and/or angiography were not follow through with by patient. He does have a history of  being seen in lymphedema clinic in 2018, stopped going approximately 6 months ago stating "it didn't do any  good". He does not have lymphedema pumps, he does not have custom fit compression wrap/stockings. He is diabetic and his recent A1c last month was 7.6. He admits to chronic bilateral lower extremity pain, no change in pain since blister and ulceration development. He is currently being treated with Levaquin for bronchitis. He has home health and we will continue. 10/25/17-He is here in follow-up evaluation for bilateral lower extremity ulcerationssubtle he remains on Levaquin for bronchitis. Right lower extremity with no evidence of drainage or ulceration, persistent left lower extremity ulceration. He states that home health has not been out since his appointment. [facility], studies revealed: RIGHT ABI 0.9, TBI 0.6 LEFT ABI 1.1, TBI 0.6 with triphasic flow bilaterally. We will continue his same treatment plan. He has been educated on compression therapy and need for elevation. He will benefit from lymphedema pumps 11/01/17-He is here in follow-up evaluation for left lower extremity ulcer. The right lower extremity remains healed. He has home health services, but they have not been out to see the patient for 2-3 weeks. He states it home health physical therapy changed his dressing yesterday after therapy; he placed Ace wrap compression. We are still waiting for lymphedema pumps, reordered d/t need for company change. 11/08/17-He is here in follow-up evaluation for left lower extremity ulcer. It is improved. Edema is significantly improved with compression therapy. We will continue with same treatment plan and he will follow-up next week. No word regarding lymphedema pumps 11/15/17-He is here in follow-up evaluation for left lower extremity ulcer. He is healed and will be discharged from wound care services. I have reached out to [facility] regarding his lymphedema pumps. They have been unable to reach the patient; the contact number they had with the  patient's wife's cell phone and she has not answered any unrecognized calls. Contact should be made today, trial planned for next week; [facility] will continue to follow 11/27/17 on evaluation today patient has multiple blistered areas over the right lower extremity his left lower extremity appears to be doing okay. These blistered areas show signs of no infection which is great news. With that being said he did have some necrotic skin overlying which was mechanically debrided away with saline and gauze today without complication. Overall post debridement the wounds appear to be doing better but in general his swelling seems to be increased. This is obviously not good news. I think this is what has given rise to the blisters. 12/04/17 on evaluation today patient presents for follow-up concerning his bilateral lower extremity edema in the right lower extremity ulcers. He has been tolerating the dressing changes without complication. With that being said he has had no real issues with the wraps which is also good news. Overall I'm pleased with the progress he's been making. 12/11/17 on evaluation today patient appears to be doing rather well in regard to his right lateral lower extremity ulcer. He's been tolerating the dressing changes without complication. Fortunately there does not appear to be any evidence of infection at this time. Overall I'm pleased with the progress that is being made. Unfortunately he has been in the hospital due to having what sounds to be a stomach virus/flu fortunately that is starting to get better. 12/18/17 on evaluation today patient actually appears to be doing very well in regard to his bilateral lower extremities the  BODEY, FRIZELL. ([id number]) swelling is under fairly good control his lymphedema pumps are still not up and running quite yet. With that being said he does have several areas of opening noted as far as wounds are concerned mainly over the left lower  extremity. With that being said I do believe once he gets lymphedema pumps this would least help you mention some the fluid and preventing this from occurring. Hopefully that will be set up soon sleeves are Artie in place at his home he just waiting for the machine. 12/25/17 on evaluation today patient actually appears to be doing excellent in fact all of his ulcers appear to have resolved his legs appear very well. I do think he needs compression stockings we have discussed this and they are actually going to go to [place] today to elastic therapy to get this fitted for him. I think that is definitely a good thing to do. Readmission: 04/09/18 upon evaluation today this patient is seen for readmission due to bilateral lower extremity lymphedema. He has significant swelling of his extremities especially on the left although the right is also swollen he has weeping from both sides. There are no obvious open wounds at this point. Fortunately he has been doing fairly well for quite a bit of time since I last saw him. Nonetheless unfortunately this seems to have reopened and is giving quite a bit of trouble. He states this began about a week ago when he first called [place] to get in to be seen. No fevers, chills, nausea, or vomiting noted at this time. He has not been using his lymphedema pumps due to the fact that they won't fit on his leg at this point likewise is also not been using his compression for essentially the same reason. 04/16/18 upon evaluation today patient actually appears to be doing a little better in regard to the fluid in his bilateral lower extremities. With that being said he's had three falls since I saw him last week. He also states that he's been feeling very poorly. I was concerned last week and feel like that the concern is still there as far as the congestion in his chest is concerned he seems to be breathing about the same as last week but again he states he's very weak he's not  even able to walk further than from the chair to the door. His wife had to buy a wheelchair just to be able to get them out of the house to get to the appointment today. This has me very concerned. 04/23/18 on evaluation today patient actually appears to be doing much better than last week's evaluation. At that time actually had to transport him to the ER via EMS and he subsequently was admitted for acute pulmonary edema, acute renal failure, and acute congestive heart failure. Fortunately he is doing much better. Apparently they did dialyze him and were able to take off roughly 35 pounds of fluid. Nonetheless he is feeling much better both in regard to his breathing and he's able to get around much better at this time compared to previous. Overall I'm very happy with how things are at this time. There does not appear to be any evidence of infection currently. No fevers, chills, nausea, or vomiting noted at this time. 04/30/2018 patient seen today for follow-up and management of bilateral lower extremity lymphedema. He did express being more sad today than usual due to the recent loss of his dog. He states that he has been compliant  with using the lymphedema pumps. However he does admit a minute over the last 2-3 days he has not been using the pumps due to the recent loss of his dog. At this time there is no drainage or open wounds to his lower extremities. The left leg edema is measuring smaller today. Still has a significant amount of edema on bilateral lower extremities With dry flaky skin. He will be referred to the lymphedema clinic for further management. Will continue 3 layer compression wraps and follow-up in 1-2 weeks.Denies any pain, fever, chairs recently. No recent falls or injuries reported during this visit. Patient History Information obtained from Patient. Family History Cancer - Paternal Grandparents, Kidney Disease - Siblings, Lung Disease - Mother, No family history of  Diabetes, Heart Disease, Hereditary Spherocytosis, Hypertension, Seizures, Stroke, Thyroid Problems, Tuberculosis. Social History Never smoker, Marital Status - Married, Alcohol Use - Never, Drug Use - No History, Caffeine Use - Never. Medical History Hospitalization/Surgery History - 11/03/2013, [facility], bronchitis. Medical And Surgical History Notes Respiratory home O2 at night as needed BORDEN, THUNE ([id number]) Cardiovascular varicose veins Neurologic CVA - 1992 Review of Systems (ROS) Constitutional Symptoms (General Health) The patient has no complaints or symptoms. Respiratory The patient has no complaints or symptoms. Cardiovascular The patient has no complaints or symptoms. Integumentary (Skin) Complains or has symptoms of Swelling - Bil LE. Objective Constitutional appears in no distress. Vitals Time Taken: 12:40 PM, Height: 66 in, Weight: 323 lbs, BMI: 52.1, Temperature: 97.6 F, Pulse: 76 bpm, Respiratory Rate: 18 breaths/min, Blood Pressure: 125/60 mmHg. Respiratory Respiratory effort is easy and symmetric bilaterally. Rate is normal at rest and on room air.. Cardiovascular Pedal pulses palpable and strong bilaterally.. Extremities are free of varicosities, clubbing or edema. Peripheral pulses strong and equal. Capillary refill < 3 seconds.. General Notes: 04/30/2018 patient with lower extremity edema with lymphedema. He has home health that comes to assist with wrapping of his legs. He may not always be compliant with the use of the leg wraps or the lymphedema pumps. Happy to see if there is no weeping or new wounds to the lower extremities. Integumentary (Hair, Skin) lymphoedema. Plan Wound Cleansing: May Shower, gently pat wound dry prior to applying new dressing. - Please do not get your wraps wet but you can remove the wraps prior to nurse visits and wound clinic visits and shower and wash your legs Skin Barriers/Peri-Wound Care: Moisturizing lotion DAVYN, MORANDI ([id number]) Secondary Dressing: Lauraine Rinne - on any draining lymphedema Dressing Change Frequency: Other: - twice weekly - on Tuesdays and Fridays. Patient comes to [place] on Tuesdays, [facility] to change wraps on Fridays Follow-up Appointments: Return Appointment in 1 week. Edema Control: 3 Layer Compression System - Bilateral - may anchor with unna if needed Compression Pump: Use compression pump on left lower extremity for 30 minutes, twice daily. - Please use for 1 hour twice daily Compression Pump: Use compression pump on right lower extremity for 30 minutes, twice daily. - Please use for 1 hour twice daily Home Health: [place] Nurse may visit [facility] to address patient s wound care needs. FACE TO FACE ENCOUNTER: MEDICARE and MEDICAID PATIENTS: I certify that this patient is under my care and that I had a face-to-face encounter that meets the physician face-to-face encounter requirements with this patient on this date. The encounter with the patient was in whole or in part for the following MEDICAL CONDITION: (primary reason for [place]) MEDICAL NECESSITY:  I certify, that based on my findings, NURSING services are a medically necessary home health service. HOME BOUND STATUS: I certify that my clinical findings support that this patient is homebound (i.e., Due to illness or injury, pt requires aid of supportive devices such as crutches, cane, wheelchairs, walkers, the use of special transportation or the assistance of another person to leave their place of residence. There is a normal inability to leave the home and doing so requires considerable and taxing effort. Other absences are for medical reasons / religious services and are infrequent or of short duration when for other reasons). If current dressing causes regression in wound condition, may D/C ordered dressing product/s and apply Normal Saline Moist Dressing daily until  next [place] / Other MD appointment. [place] of regression in wound condition at [phone]. Please direct any NON-WOUND related issues/requests for orders to patient's Primary Care Physician Services and Therapies ordered were: Lymphedema Clinic Electronic Signature(s) Signed: 06/02/2018 12:49:36 AM By: Beather Arbour FNP-C Entered By: Beather Arbour on 06/02/2018 00:43:42 Adam Merritt ([id number]) -------------------------------------------------------------------------------- ROS/PFSH Details Patient Name: Adam Merritt Date of Service: 04/30/2018 12:45 PM Medical Record Number: [id number] Patient Account Number: [id number] Date of Birth/Sex: [birth year] (72 y.o. M) Treating RN: Montey Hora Primary Care Provider: [name] Other Clinician: Referring Provider: [name] Treating Provider/Extender: Oneida Arenas in Treatment: 3 Information Obtained From Patient Wound History Do you currently have one or more open woundso No Have you tested positive for osteomyelitis (bone infection)o No Have you had any tests for circulation on your legso Yes Integumentary (Skin) Complaints and Symptoms: Positive for: Swelling - Bil LE Medical History: Negative for: History of Burn; History of pressure wounds Constitutional Symptoms (General Health) Complaints and Symptoms: No Complaints or Symptoms Eyes Medical History: Negative for: Cataracts; Glaucoma; Optic Neuritis Ear/Nose/Mouth/Throat Medical History: Negative for: Chronic sinus problems/congestion; Middle ear problems Hematologic/Lymphatic Medical History: Positive for: Anemia - aplastic anemia; Lymphedema Negative for: Hemophilia; Human Immunodeficiency Virus; Sickle Cell Disease Respiratory Complaints and Symptoms: No Complaints or Symptoms Medical History: Positive for: Chronic Obstructive Pulmonary Disease (COPD); Sleep Apnea Negative for: Aspiration; Asthma;  Pneumothorax; Tuberculosis Past Medical History Notes: home O2 at night as needed Cardiovascular AVIRAJ, KENTNER ([id number]) Complaints and Symptoms: No Complaints or Symptoms Medical History: Positive for: Congestive Heart Failure; Coronary Artery Disease; Hypertension; Peripheral Venous Disease Negative for: Angina; Arrhythmia; Deep Vein Thrombosis; Hypotension; Myocardial Infarction; Peripheral Arterial Disease; Phlebitis; Vasculitis Past Medical History Notes: varicose veins Gastrointestinal Medical History: Negative for: Cirrhosis ; Colitis; Crohnos; Hepatitis A; Hepatitis B; Hepatitis C Endocrine Medical History: Positive for: Type II Diabetes Negative for: Type I Diabetes Time with diabetes: since 2006 Treated with: Insulin Blood sugar tested every day: Yes Tested : Blood sugar testing results: Breakfast: 209 Genitourinary Medical History: Negative for: End Stage Renal Disease Immunological Medical History: Negative for: Lupus Erythematosus; Raynaudos; Scleroderma Musculoskeletal Medical History: Positive for: Osteoarthritis Negative for: Gout; Rheumatoid Arthritis; Osteomyelitis Neurologic Medical History: Positive for: Neuropathy Negative for: Dementia; Quadriplegia; Paraplegia; Seizure Disorder Past Medical History Notes: CVA - 1992 Oncologic Medical History: Positive for: Received Chemotherapy - last dose 2006 Negative for: Received Radiation Psychiatric COADY, TRAIN ([id number]) Medical History: Negative for: Anorexia/bulimia; Confinement Anxiety Immunizations Pneumococcal Vaccine: Received Pneumococcal Vaccination: Yes Implantable Devices Hospitalization / Surgery History Name of Hospital Purpose of Hospitalization/Surgery Date [facility] bronchitis 11/03/2013 Family and Social History Cancer: Yes - Paternal Grandparents; Diabetes: No; Heart Disease: No; Hereditary Spherocytosis: No; Hypertension: No; Kidney Disease: Yes -  Siblings; Lung Disease: Yes  - Mother; Seizures: No; Stroke: No; Thyroid Problems: No; Tuberculosis: No; Never smoker; Marital Status - Married; Alcohol Use: Never; Drug Use: No History; Caffeine Use: Never; Financial Concerns: No; Food, Clothing or Shelter Needs: No; Support System Lacking: No; Transportation Concerns: No; Advanced Directives: No; Patient does not want information on Advanced Directives Physician Affirmation I have reviewed and agree with the above information. Electronic Signature(s) Signed: 04/30/2018 5:02:25 PM By: Montey Hora Signed: 05/05/2018 5:29:42 PM By: Beather Arbour FNP-C Entered By: Beather Arbour on 04/30/2018 13:35:50 Adam Merritt ([id number]) -------------------------------------------------------------------------------- [place] Details Patient Name: Adam Merritt Date of Service: 04/30/2018 Medical Record Number: [id number] Patient Account Number: [id number] Date of Birth/Sex: 1945/09/03 (72 y.o. M) Treating RN: Montey Hora Primary Care Provider: [name] Other Clinician: Referring Provider: [name] Treating Provider/Extender: Beather Arbour [name]: 3 Diagnosis Coding ICD-10 Codes Code Description L03.115 Cellulitis of right lower limb L03.116 Cellulitis of left lower limb I89.0 Lymphedema, not elsewhere classified E66.9 Obesity, unspecified Facility Procedures CPT4: Description Modifier Quantity Code [id number] BILATERAL: Application of multi-layer venous compression system; leg (below 1 knee), including ankle and foot. ICD-10 Diagnosis Description I89.0 Lymphedema, not elsewhere classified Electronic Signature(s) Signed: 06/02/2018 12:49:36 AM By: Beather Arbour FNP-C Previous Signature: 04/30/2018 5:02:25 PM Version By: Montey Hora Previous Signature: 05/05/2018 5:29:42 PM Version By: Beather Arbour FNP-C Entered By: Beather Arbour on 06/02/2018 00:48:34

## 2018-05-07 ENCOUNTER — Encounter: Payer: Medicare Other | Attending: Physician Assistant | Admitting: Physician Assistant

## 2018-05-07 DIAGNOSIS — G4730 Sleep apnea, unspecified: Secondary | ICD-10-CM | POA: Diagnosis not present

## 2018-05-07 DIAGNOSIS — I509 Heart failure, unspecified: Secondary | ICD-10-CM | POA: Diagnosis not present

## 2018-05-07 DIAGNOSIS — Z9221 Personal history of antineoplastic chemotherapy: Secondary | ICD-10-CM | POA: Insufficient documentation

## 2018-05-07 DIAGNOSIS — L03115 Cellulitis of right lower limb: Secondary | ICD-10-CM | POA: Diagnosis not present

## 2018-05-07 DIAGNOSIS — I890 Lymphedema, not elsewhere classified: Secondary | ICD-10-CM | POA: Insufficient documentation

## 2018-05-07 DIAGNOSIS — Z6841 Body Mass Index (BMI) 40.0 and over, adult: Secondary | ICD-10-CM | POA: Diagnosis not present

## 2018-05-07 DIAGNOSIS — E1140 Type 2 diabetes mellitus with diabetic neuropathy, unspecified: Secondary | ICD-10-CM | POA: Insufficient documentation

## 2018-05-07 DIAGNOSIS — Z794 Long term (current) use of insulin: Secondary | ICD-10-CM | POA: Diagnosis not present

## 2018-05-07 DIAGNOSIS — L97512 Non-pressure chronic ulcer of other part of right foot with fat layer exposed: Secondary | ICD-10-CM | POA: Diagnosis not present

## 2018-05-07 DIAGNOSIS — L97822 Non-pressure chronic ulcer of other part of left lower leg with fat layer exposed: Secondary | ICD-10-CM | POA: Diagnosis not present

## 2018-05-07 DIAGNOSIS — L03116 Cellulitis of left lower limb: Secondary | ICD-10-CM | POA: Insufficient documentation

## 2018-05-07 DIAGNOSIS — Z8673 Personal history of transient ischemic attack (TIA), and cerebral infarction without residual deficits: Secondary | ICD-10-CM | POA: Insufficient documentation

## 2018-05-07 DIAGNOSIS — I110 Hypertensive heart disease with heart failure: Secondary | ICD-10-CM | POA: Insufficient documentation

## 2018-05-07 DIAGNOSIS — J449 Chronic obstructive pulmonary disease, unspecified: Secondary | ICD-10-CM | POA: Insufficient documentation

## 2018-05-07 DIAGNOSIS — I2510 Atherosclerotic heart disease of native coronary artery without angina pectoris: Secondary | ICD-10-CM | POA: Insufficient documentation

## 2018-05-07 DIAGNOSIS — E11621 Type 2 diabetes mellitus with foot ulcer: Secondary | ICD-10-CM | POA: Diagnosis not present

## 2018-05-07 DIAGNOSIS — E669 Obesity, unspecified: Secondary | ICD-10-CM | POA: Diagnosis not present

## 2018-05-11 NOTE — Progress Notes (Signed)
Adam Merritt ([id number]) Visit Report for 05/07/2018 Arrival Information Details Patient Name: Adam Merritt Date of Service: 05/07/2018 2:15 PM Medical Record Number: [id number] Patient Account Number: [id number] Date of Birth/Sex: 11/29/1945 (72 y.o. M) Treating RN: [name] Ginger Primary Care Luba Matzen: [name] Other Clinician: Referring Chemika Nightengale: [name] Treating Belkis Norbeck/Extender: Melburn Hake, [name]: 4 Visit Information History Since Last Visit Added or deleted any medications: No Patient Arrived: Wheel Chair Any new allergies or adverse reactions: No Arrival Time: 14:52 Had a fall or experienced change in No Accompanied By: self activities of daily living that may affect Transfer Assistance: None risk of falls: Patient Identification Verified: Yes Signs or symptoms of abuse/neglect since last visito No Secondary Verification Process Yes Hospitalized since last visit: No Completed: Implantable device outside of the clinic excluding No Patient Has Alerts: Yes cellular tissue based products placed in the center Patient Alerts: Patient on Blood since last visit: Thinner Has Dressing in Place as Prescribed: Yes Aspirin 325mg  Pain Present Now: No Type II Diabetes ABI 5/19 L 1.1 R 0.9 TBI L 0.6 R 0.6 Electronic Signature(s) Signed: 05/07/2018 4:16:42 PM By: [name] Ginger Entered By: [name] Ginger on 05/07/2018 14:54:42 Adam Merritt ([id number]) -------------------------------------------------------------------------------- Compression Therapy Details Patient Name: Adam Merritt Date of Service: 05/07/2018 2:15 PM Medical Record Number: [id number] Patient Account Number: [id number] Date of Birth/Sex: 03/12/1946 (72 y.o. M) Treating RN: Montey Hora Primary Care Dyanna Seiter: [name] Other Clinician: Referring Hanako Tipping: [name] Treating Mercy Leppla/Extender: Melburn Hake, [name]: 4 Compression Therapy Performed for Wound Assessment:  NonWound Condition Lymphedema - Bilateral Leg Performed By: Clinician Montey Hora, RN Compression Type: Three Layer Post Procedure Diagnosis Same as Pre-procedure Electronic Signature(s) Signed: 05/07/2018 5:21:38 PM By: Montey Hora Entered By: Montey Hora on 05/07/2018 15:26:22 Adam Merritt ([id number]) -------------------------------------------------------------------------------- Encounter Discharge Information Details Patient Name: Adam Merritt Date of Service: 05/07/2018 2:15 PM Medical Record Number: [id number] Patient Account Number: [id number] Date of Birth/Sex: 23-Sep-1945 (72 y.o. M) Treating RN: Harold Barban Primary Care Ulysses Alper: [name] Other Clinician: Referring Cloria Ciresi: [name] Treating Lemonte Al/Extender: Melburn Hake, [name]: 4 Encounter Discharge Information Items Discharge Condition: Stable Ambulatory Status: Wheelchair Discharge Destination: Home Transportation: Private Auto Accompanied By: wife Schedule Follow-up Appointment: Yes Clinical Summary of Care: Electronic Signature(s) Signed: 05/07/2018 5:26:41 PM By: Harold Barban Entered By: Harold Barban on 05/07/2018 15:23:26 Adam Merritt ([id number]) -------------------------------------------------------------------------------- Lower Extremity Assessment Details Patient Name: Adam Merritt Date of Service: 05/07/2018 2:15 PM Medical Record Number: [id number] Patient Account Number: [id number] Date of Birth/Sex: [birth year] (72 y.o. M) Treating RN: [name] Ginger Primary Care Rosalynd Mcwright: [name] Other Clinician: Referring Savahanna Almendariz: [name] Treating Emily Massar/Extender: Melburn Hake, [name]: 4 Edema Assessment Assessed: [Left: No] [Right: No] Edema: [Left: Yes] [Right: Yes] Calf Left: Right: Point of Measurement: 35 cm From Medial Instep 66.5 cm 54 cm Ankle Left: Right: Point of Measurement: 11 cm From Medial Instep 41.5 cm 33.5 cm Vascular  Assessment Claudication: Claudication Assessment [Left:None] Pulses: Dorsalis Pedis Palpable: [Left:Yes] [Right:Yes] Posterior Tibial Extremity colors, hair growth, and conditions: Extremity Color: [Left:Hyperpigmented] [Right:Hyperpigmented] Hair Growth on Extremity: [Right:No] Temperature of Extremity: [Left:Warm] [Right:Warm] Capillary Refill: [Right:< 3 seconds] Toe Nail Assessment Left: Right: Thick: Yes Yes Discolored: Yes Yes Deformed: Yes Yes Improper Length and Hygiene: Yes Yes Electronic Signature(s) Signed: 05/07/2018 4:16:42 PM By: [name] Ginger Entered By: [name] Ginger on 05/07/2018 15:00:56 Adam Merritt ([id number]) -------------------------------------------------------------------------------- Multi Wound Chart Details Patient Name: [name],  Quin Merritt Date of Service: 05/07/2018 2:15 PM Medical Record Number: [id number] Patient Account Number: [id number] Date of Birth/Sex: June 11, 1945 (72 y.o. M) Treating RN: Montey Hora Primary Care Mairlyn Tegtmeyer: [name] Other Clinician: Referring Fredrika Canby: [name] Treating Menashe Kafer/Extender: Melburn Hake, [name]: 4 Vital Signs Height(in): 66 Pulse(bpm): 77 Weight(lbs): 323 Blood Pressure(mmHg): 166/80 Body Mass Index(BMI): 52 Temperature(F): 98.1 Respiratory Rate 18 (breaths/min): Wound Assessments Treatment Notes Electronic Signature(s) Signed: 05/07/2018 5:21:38 PM By: Montey Hora Entered By: Montey Hora on 05/07/2018 15:08:14 Adam Merritt ([id number]) -------------------------------------------------------------------------------- [place] Details Patient Name: Adam Merritt Date of Service: 05/07/2018 2:15 PM Medical Record Number: [id number] Patient Account Number: [id number] Date of Birth/Sex: 1945/10/28 (72 y.o. M) Treating RN: Montey Hora Primary Care Breckon Reeves: [name] Other Clinician: Referring Orville Widmann: [name] Treating Jamilee Lafosse/Extender: Melburn Hake,  [name]: 4 Active Inactive Abuse / Safety / Falls / Self Care Management Nursing Diagnoses: Potential for falls Goals: Patient will remain injury free related to falls Date Initiated: 04/09/2018 Target Resolution Date: 07/13/2018 Goal Status: Active Interventions: Assess fall risk on admission and as needed Notes: Orientation to the Wound Care Program Nursing Diagnoses: Knowledge deficit related to the wound healing center program Goals: Patient/caregiver will verbalize understanding of the [place] Program Date Initiated: 04/09/2018 Target Resolution Date: 07/13/2018 Goal Status: Active Interventions: Provide education on orientation to the wound center Notes: Venous Leg Ulcer Nursing Diagnoses: Actual venous Insuffiency (use after diagnosis is confirmed) Goals: Patient will maintain optimal edema control Date Initiated: 04/09/2018 Target Resolution Date: 07/13/2018 Goal Status: Active Interventions: Assess peripheral edema status every visit. KAUSHAL, VANNICE ([id number]) Compression as ordered Provide education on venous insufficiency Notes: Electronic Signature(s) Signed: 05/07/2018 5:21:38 PM By: Montey Hora Entered By: Montey Hora on 05/07/2018 15:07:54 Adam Merritt ([id number]) -------------------------------------------------------------------------------- Non-Wound Condition Assessment Details Patient Name: Adam Merritt Date of Service: 05/07/2018 2:15 PM Medical Record Number: [id number] Patient Account Number: [id number] Date of Birth/Sex: Aug 25, 1945 (72 y.o. M) Treating RN: [name] Ginger Primary Care Teonia Yager: [name] Other Clinician: Referring Starnisha Batrez: [name] Treating Ebbie Cherry/Extender: STONE III, [name]: 4 Non-Wound Condition: Condition: Lymphedema Location: Leg Side: Bilateral Photos Periwound Skin Texture Texture Color No Abnormalities Noted: No No Abnormalities Noted: No Callus: No Atrophie  Blanche: No Crepitus: No Cyanosis: No Excoriation: No Ecchymosis: No Friable: No Erythema: No Induration: Yes Hemosiderin Staining: Yes Rash: No Mottled: No Scarring: No Pallor: No Rubor: No Moisture No Abnormalities Noted: No Dry / Scaly: No Maceration: No Electronic Signature(s) Signed: 05/08/2018 4:49:02 PM By: [name] Ginger Entered By: [name] Ginger on 05/07/2018 16:19:47 Adam Merritt ([id number]) -------------------------------------------------------------------------------- Pain Assessment Details Patient Name: Adam Merritt Date of Service: 05/07/2018 2:15 PM Medical Record Number: [id number] Patient Account Number: [id number] Date of Birth/Sex: 07-10-1945 (72 y.o. M) Treating RN: [name] Ginger Primary Care Cele Mote: [name] Other Clinician: Referring Jaysion Ramseyer: [name] Treating Livi Mcgann/Extender: Melburn Hake, [name]: 4 Active Problems Location of Pain Severity and Description of Pain Patient Has Paino No Site Locations Pain Management and Medication Current Pain Management: Goals for Pain Management pt denies any pain at this time. Electronic Signature(s) Signed: 05/07/2018 4:16:42 PM By: [name] Ginger Entered By: [name] Ginger on 05/07/2018 14:55:23 Adam Merritt ([id number]) -------------------------------------------------------------------------------- Patient/Caregiver Education Details Patient Name: Adam Merritt Date of Service: 05/07/2018 2:15 PM Medical Record Number: [id number] Patient Account Number: [id number] Date of Birth/Gender: 1945/09/06 (72 y.o. M) Treating RN: Harold Barban Primary Care Physician: [name] Other  Clinician: Referring Physician: [name] Treating Physician/Extender: Sharalyn Ink in Treatment: 4 Education Assessment Education Provided To: Patient Education Topics Provided Electronic Signature(s) Signed: 05/07/2018 5:26:41 PM By: Harold Barban Entered By: Harold Barban on 05/07/2018  15:23:32 Adam Merritt ([id number]) -------------------------------------------------------------------------------- Vitals Details Patient Name: Adam Merritt Date of Service: 05/07/2018 2:15 PM Medical Record Number: [id number] Patient Account Number: [id number] Date of Birth/Sex: 19-Oct-1945 (72 y.o. M) Treating RN: [name] Ginger Primary Care Casady Voshell: [name] Other Clinician: Referring Riccardo Holeman: [name] Treating Wanna Gully/Extender: Melburn Hake, [name]: 4 Vital Signs Time Taken: 14:55 Temperature (F): 98.1 Height (in): 66 Pulse (bpm): 77 Weight (lbs): 323 Respiratory Rate (breaths/min): 18 Body Mass Index (BMI): 52.1 Blood Pressure (mmHg): 166/80 Reference Range: 80 - 120 mg / dl Notes pt stated forgot to take BP meds today. NAD at this time. Electronic Signature(s) Signed: 05/07/2018 4:16:42 PM By: [name] Ginger Entered By: [name] Ginger on 05/07/2018 14:56:41

## 2018-05-11 NOTE — Progress Notes (Signed)
KUTTER, SCHNEPF ([id number]) Visit Report for 05/07/2018 Chief Complaint Document Details Patient Name: Adam Merritt, Adam Merritt Date of Service: 05/07/2018 2:15 PM Medical Record Number: [id number] Patient Account Number: [id number] Date of Birth/Sex: [birth year] (72 y.o. M) Treating RN: Montey Hora Primary Care Provider: [name] Other Clinician: Referring Provider: [name] Treating Provider/Extender: Melburn Hake, Artie Mcintyre [name]: 4 Information Obtained from: Patient Chief Complaint he is here for evaluation of bilateral lower extremity lymphedema Electronic Signature(s) Signed: 05/09/2018 1:15:36 AM By: Worthy Keeler PA-C Entered By: Worthy Keeler on 05/07/2018 13:52:26 Adam Merritt ([id number]) -------------------------------------------------------------------------------- HPI Details Patient Name: Adam Merritt Date of Service: 05/07/2018 2:15 PM Medical Record Number: [id number] Patient Account Number: [id number] Date of Birth/Sex: 03-Sep-1945 (72 y.o. M) Treating RN: Montey Hora Primary Care Provider: [name] Other Clinician: Referring Provider: [name] Treating Provider/Extender: Melburn Hake, Skylarr Liz [name]: 4 History of Present Illness HPI Description: 10/18/17-He is here for initial evaluation of bilateral lower extremity ulcerations in the presence of venous insufficiency and lymphedema. He has been seen by vascular medicine in the past, Dr. Lucky Cowboy, last seen in 2016. He does have a history of abnormal ABIs, which is to be expected given his lymphedema and venous insufficiency. According to Epic, it appears that all attempts for arterial evaluation and/or angiography were not follow through with by patient. He does have a history of being seen in lymphedema clinic in 2018, stopped going approximately 6 months ago stating "it didn't do any good". He does not have lymphedema pumps, he does not have custom fit compression wrap/stockings. He is diabetic  and his recent A1c last month was 7.6. He admits to chronic bilateral lower extremity pain, no change in pain since blister and ulceration development. He is currently being treated with Levaquin for bronchitis. He has home health and we will continue. 10/25/17-He is here in follow-up evaluation for bilateral lower extremity ulcerationssubtle he remains on Levaquin for bronchitis. Right lower extremity with no evidence of drainage or ulceration, persistent left lower extremity ulceration. He states that home health has not been out since his appointment. He went to [facility] on Tuesday, studies revealed: RIGHT ABI 0.9, TBI 0.6 LEFT ABI 1.1, TBI 0.6 with triphasic flow bilaterally. We will continue his same treatment plan. He has been educated on compression therapy and need for elevation. He will benefit from lymphedema pumps 11/01/17-He is here in follow-up evaluation for left lower extremity ulcer. The right lower extremity remains healed. He has home health services, but they have not been out to see the patient for 2-3 weeks. He states it home health physical therapy changed his dressing yesterday after therapy; he placed Ace wrap compression. We are still waiting for lymphedema pumps, reordered d/t need for company change. 11/08/17-He is here in follow-up evaluation for left lower extremity ulcer. It is improved. Edema is significantly improved with compression therapy. We will continue with same treatment plan and he will follow-up next week. No word regarding lymphedema pumps 11/15/17-He is here in follow-up evaluation for left lower extremity ulcer. He is healed and will be discharged from wound care services. I have reached out to [facility] regarding his lymphedema pumps. They have been unable to reach the patient; the contact number they had with the patient's wife's cell phone and she has not answered any unrecognized calls. Contact should be made today, trial planned  for next week; [facility] will continue to follow 11/27/17 on evaluation today patient has  multiple blistered areas over the right lower extremity his left lower extremity appears to be doing okay. These blistered areas show signs of no infection which is great news. With that being said he did have some necrotic skin overlying which was mechanically debrided away with saline and gauze today without complication. Overall post debridement the wounds appear to be doing better but in general his swelling seems to be increased. This is obviously not good news. I think this is what has given rise to the blisters. 12/04/17 on evaluation today patient presents for follow-up concerning his bilateral lower extremity edema in the right lower extremity ulcers. He has been tolerating the dressing changes without complication. With that being said he has had no real issues with the wraps which is also good news. Overall I'm pleased with the progress he's been making. 12/11/17 on evaluation today patient appears to be doing rather well in regard to his right lateral lower extremity ulcer. He's been tolerating the dressing changes without complication. Fortunately there does not appear to be any evidence of infection at this time. Overall I'm pleased with the progress that is being made. Unfortunately he has been in the hospital due to having what sounds to be a stomach virus/flu fortunately that is starting to get better. 12/18/17 on evaluation today patient actually appears to be doing very well in regard to his bilateral lower extremities the swelling is under fairly good control his lymphedema pumps are still not up and running quite yet. With that being said he does have several areas of opening noted as far as wounds are concerned mainly over the left lower extremity. With that being said I do believe once he gets lymphedema pumps this would least help you mention some the fluid and preventing this from  occurring. Hopefully that will be set up soon sleeves are Artie in place at his home he just waiting for the machine. 12/25/17 on evaluation today patient actually appears to be doing excellent in fact all of his ulcers appear to have resolved his HUMPHREY, GUERREIRO. ([id number]) legs appear very well. I do think he needs compression stockings we have discussed this and they are actually going to go to [place] today to elastic therapy to get this fitted for him. I think that is definitely a good thing to do. Readmission: 04/09/18 upon evaluation today this patient is seen for readmission due to bilateral lower extremity lymphedema. He has significant swelling of his extremities especially on the left although the right is also swollen he has weeping from both sides. There are no obvious open wounds at this point. Fortunately he has been doing fairly well for quite a bit of time since I last saw him. Nonetheless unfortunately this seems to have reopened and is giving quite a bit of trouble. He states this began about a week ago when he first called [place] to get in to be seen. No fevers, chills, nausea, or vomiting noted at this time. He has not been using his lymphedema pumps due to the fact that they won't fit on his leg at this point likewise is also not been using his compression for essentially the same reason. 04/16/18 upon evaluation today patient actually appears to be doing a little better in regard to the fluid in his bilateral lower extremities. With that being said he's had three falls since I saw him last week. He also states that he's been feeling very poorly. I was concerned last week and feel like that  the concern is still there as far as the congestion in his chest is concerned he seems to be breathing about the same as last week but again he states he's very weak he's not even able to walk further than from the chair to the door. His wife had to buy a wheelchair just to be able to get them  out of the house to get to the appointment today. This has me very concerned. 04/23/18 on evaluation today patient actually appears to be doing much better than last week's evaluation. At that time actually had to transport him to the ER via EMS and he subsequently was admitted for acute pulmonary edema, acute renal failure, and acute congestive heart failure. Fortunately he is doing much better. Apparently they did dialyze him and were able to take off roughly 35 pounds of fluid. Nonetheless he is feeling much better both in regard to his breathing and he's able to get around much better at this time compared to previous. Overall I'm very happy with how things are at this time. There does not appear to be any evidence of infection currently. No fevers, chills, nausea, or vomiting noted at this time. 04/30/2018 patient seen today for follow-up and management of bilateral lower extremity lymphedema. He did express being more sad today than usual due to the recent loss of his dog. He states that he has been compliant with using the lymphedema pumps. However he does admit a minute over the last 2-3 days he has not been using the pumps due to the recent loss of his dog. At this time there is no drainage or open wounds to his lower extremities. The left leg edema is measuring smaller today. Still has a significant amount of edema on bilateral lower extremities With dry flaky skin. He will be referred to the lymphedema clinic for further management. Will continue 3 layer compression wraps and follow-up in 1-2 weeks.Denies any pain, fever, chairs recently. No recent falls or injuries reported during this visit. 05/07/18 on evaluation today patient actually appears to be doing very well in regard to his lower extremities in general all things considered. With that being said he is having some pain in the legs just due to the amount of swelling. He does have an area where he had a blister on the left lateral  lower extremity this is open at this point other than that there's nothing else weeping at this time. Electronic Signature(s) Signed: 05/09/2018 1:15:36 AM By: Worthy Keeler PA-C Entered By: Worthy Keeler on 05/08/2018 21:30:38 Adam Merritt ([id number]) -------------------------------------------------------------------------------- Physical Exam Details Patient Name: Adam Merritt Date of Service: 05/07/2018 2:15 PM Medical Record Number: [id number] Patient Account Number: [id number] Date of Birth/Sex: November 27, 1945 (72 y.o. M) Treating RN: Montey Hora Primary Care Provider: [name] Other Clinician: Referring Provider: [name] Treating Provider/Extender: STONE III, Dynastie Knoop [name]: 4 Constitutional Well-nourished and well-hydrated in no acute distress. Respiratory normal breathing without difficulty. clear to auscultation bilaterally. Cardiovascular regular rate and rhythm with normal S1, S2. Psychiatric this patient is able to make decisions and demonstrates good insight into disease process. Alert and Oriented x 3. pleasant and cooperative. Notes Patient's lower [place] is not require sharp debridement at this point he does have significant swelling I still think lymphedema clinical be a good idea for him fortunately he did get in touch with the [facility] hospital and had his appointment where they are working on getting his Velcro compression wraps. Electronic Signature(s) Signed:  05/09/2018 1:15:36 AM By: Worthy Keeler PA-C Entered By: Worthy Keeler on 05/08/2018 21:31:50 Adam Merritt ([id number]) -------------------------------------------------------------------------------- Physician Orders Details Patient Name: Adam Merritt Date of Service: 05/07/2018 2:15 PM Medical Record Number: [id number] Patient Account Number: [id number] Date of Birth/Sex: 12-27-1945 (72 y.o. M) Treating RN: Montey Hora Primary Care Provider: [name] Other  Clinician: Referring Provider: [name] Treating Provider/Extender: Melburn Hake, Aryeh Butterfield [name]: 4 Verbal / Phone Orders: No Diagnosis Coding ICD-10 Coding Code Description L03.115 Cellulitis of right lower limb L03.116 Cellulitis of left lower limb I89.0 Lymphedema, not elsewhere classified E66.9 Obesity, unspecified Wound Cleansing o May Shower, gently pat wound dry prior to applying new dressing. - Please do not get your wraps wet but you can remove the wraps prior to nurse visits and wound clinic visits and shower and wash your legs Skin Barriers/Peri-Wound Care o Moisturizing lotion Secondary Dressing o XtraSorb - on any draining lymphedema Dressing Change Frequency o Other: - twice weekly - on Tuesdays and Fridays. Patient comes to [place] on Tuesdays, [facility] to change wraps on Fridays Follow-up Appointments o Return Appointment in 1 week. Edema Control o 3 Layer Compression System - Bilateral - may anchor with unna if needed o Compression Pump: Use compression pump on left lower extremity for 30 minutes, twice daily. - Please use for 1 hour twice daily o Compression Pump: Use compression pump on right lower extremity for 30 minutes, twice daily. - Please use for 1 hour twice daily [place] Nurse may visit PRN to address patientos wound care needs. o FACE TO FACE ENCOUNTER: MEDICARE and MEDICAID PATIENTS: I certify that this patient is under my care and that I had a face-to-face encounter that meets the physician face-to-face encounter requirements with this patient on this date. The encounter with the patient was in whole or in part for the following MEDICAL CONDITION: (primary reason for [place]) MEDICAL NECESSITY: I certify, that based on my findings, NURSING services are a medically necessary home health service. HOME BOUND STATUS: I certify that my clinical findings  support that this patient is homebound (i.e., Due to illness or injury, pt requires aid of supportive devices such as crutches, cane, wheelchairs, walkers, the use of special transportation or the assistance of another person to leave their place of residence. There is a normal inability to leave the home and doing so requires considerable and taxing effort. Other absences are for medical reasons / religious services and are infrequent or of short duration when for other reasons). o If current dressing causes regression in wound condition, may D/C ordered dressing product/s and apply Normal Saline Moist Dressing daily until next [place] / Other MD appointment. [place] of regression in wound condition at [place] ([id number]) o Please direct any NON-WOUND related issues/requests for orders to patient's Primary Care Physician Electronic Signature(s) Signed: 05/07/2018 5:21:38 PM By: Montey Hora Signed: 05/09/2018 1:15:36 AM By: Worthy Keeler PA-C Entered By: Montey Hora on 05/07/2018 15:09:15 Adam Merritt ([id number]) -------------------------------------------------------------------------------- Problem List Details Patient Name: Adam Merritt Date of Service: 05/07/2018 2:15 PM Medical Record Number: [id number] Patient Account Number: [id number] Date of Birth/Sex: 08-01-1945 (72 y.o. M) Treating RN: Montey Hora Primary Care Provider: [name] Other Clinician: Referring Provider: [name] Treating Provider/Extender: Melburn Hake, Schelly Chuba [name]: 4 Active Problems ICD-10 Evaluated Encounter Code Description Active Date Today Diagnosis L03.115  Cellulitis of right lower limb 04/09/2018 No Yes L03.116 Cellulitis of left lower limb 04/09/2018 No Yes I89.0 Lymphedema, not elsewhere classified 04/09/2018 No Yes E66.9 Obesity, unspecified 04/09/2018 No Yes Inactive Problems Resolved Problems Electronic  Signature(s) Signed: 05/09/2018 1:15:36 AM By: Worthy Keeler PA-C Entered By: Worthy Keeler on 05/07/2018 13:52:14 Adam Merritt ([id number]) -------------------------------------------------------------------------------- Progress Note Details Patient Name: Adam Merritt Date of Service: 05/07/2018 2:15 PM Medical Record Number: [id number] Patient Account Number: [id number] Date of Birth/Sex: 1945/10/07 (72 y.o. M) Treating RN: Montey Hora Primary Care Provider: [name] Other Clinician: Referring Provider: [name] Treating Provider/Extender: Melburn Hake, Maja Mccaffery [name]: 4 Subjective Chief Complaint Information obtained from Patient he is here for evaluation of bilateral lower extremity lymphedema History of Present Illness (HPI) 10/18/17-He is here for initial evaluation of bilateral lower extremity ulcerations in the presence of venous insufficiency and lymphedema. He has been seen by vascular medicine in the past, Dr. Lucky Cowboy, last seen in 2016. He does have a history of abnormal ABIs, which is to be expected given his lymphedema and venous insufficiency. According to Epic, it appears that all attempts for arterial evaluation and/or angiography were not follow through with by patient. He does have a history of being seen in lymphedema clinic in 2018, stopped going approximately 6 months ago stating "it didn't do any good". He does not have lymphedema pumps, he does not have custom fit compression wrap/stockings. He is diabetic and his recent A1c last month was 7.6. He admits to chronic bilateral lower extremity pain, no change in pain since blister and ulceration development. He is currently being treated with Levaquin for bronchitis. He has home health and we will continue. 10/25/17-He is here in follow-up evaluation for bilateral lower extremity ulcerationssubtle he remains on Levaquin for bronchitis. Right lower extremity with no evidence of drainage or ulceration,  persistent left lower extremity ulceration. He states that home health has not been out since his appointment. He went to [facility] on Tuesday, studies revealed: RIGHT ABI 0.9, TBI 0.6 LEFT ABI 1.1, TBI 0.6 with triphasic flow bilaterally. We will continue his same treatment plan. He has been educated on compression therapy and need for elevation. He will benefit from lymphedema pumps 11/01/17-He is here in follow-up evaluation for left lower extremity ulcer. The right lower extremity remains healed. He has home health services, but they have not been out to see the patient for 2-3 weeks. He states it home health physical therapy changed his dressing yesterday after therapy; he placed Ace wrap compression. We are still waiting for lymphedema pumps, reordered d/t need for company change. 11/08/17-He is here in follow-up evaluation for left lower extremity ulcer. It is improved. Edema is significantly improved with compression therapy. We will continue with same treatment plan and he will follow-up next week. No word regarding lymphedema pumps 11/15/17-He is here in follow-up evaluation for left lower extremity ulcer. He is healed and will be discharged from wound care services. I have reached out to [facility] regarding his lymphedema pumps. They have been unable to reach the patient; the contact number they had with the patient's wife's cell phone and she has not answered any unrecognized calls. Contact should be made today, trial planned for next week; [facility] will continue to follow 11/27/17 on evaluation today patient has multiple blistered areas over the right lower extremity his left lower extremity appears to be doing okay. These blistered areas show signs of no infection which  is great news. With that being said he did have some necrotic skin overlying which was mechanically debrided away with saline and gauze today without complication. Overall post debridement  the wounds appear to be doing better but in general his swelling seems to be increased. This is obviously not good news. I think this is what has given rise to the blisters. 12/04/17 on evaluation today patient presents for follow-up concerning his bilateral lower extremity edema in the right lower extremity ulcers. He has been tolerating the dressing changes without complication. With that being said he has had no real issues with the wraps which is also good news. Overall I'm pleased with the progress he's been making. 12/11/17 on evaluation today patient appears to be doing rather well in regard to his right lateral lower extremity ulcer. He's been tolerating the dressing changes without complication. Fortunately there does not appear to be any evidence of infection at this time. Overall I'm pleased with the progress that is being made. Unfortunately he has been in the hospital due to having what sounds to be a stomach virus/flu fortunately that is starting to get better. 12/18/17 on evaluation today patient actually appears to be doing very well in regard to his bilateral lower extremities the FAIRLEY, COPHER. ([id number]) swelling is under fairly good control his lymphedema pumps are still not up and running quite yet. With that being said he does have several areas of opening noted as far as wounds are concerned mainly over the left lower extremity. With that being said I do believe once he gets lymphedema pumps this would least help you mention some the fluid and preventing this from occurring. Hopefully that will be set up soon sleeves are Artie in place at his home he just waiting for the machine. 12/25/17 on evaluation today patient actually appears to be doing excellent in fact all of his ulcers appear to have resolved his legs appear very well. I do think he needs compression stockings we have discussed this and they are actually going to go to [place] today to elastic therapy to get this  fitted for him. I think that is definitely a good thing to do. Readmission: 04/09/18 upon evaluation today this patient is seen for readmission due to bilateral lower extremity lymphedema. He has significant swelling of his extremities especially on the left although the right is also swollen he has weeping from both sides. There are no obvious open wounds at this point. Fortunately he has been doing fairly well for quite a bit of time since I last saw him. Nonetheless unfortunately this seems to have reopened and is giving quite a bit of trouble. He states this began about a week ago when he first called [place] to get in to be seen. No fevers, chills, nausea, or vomiting noted at this time. He has not been using his lymphedema pumps due to the fact that they won't fit on his leg at this point likewise is also not been using his compression for essentially the same reason. 04/16/18 upon evaluation today patient actually appears to be doing a little better in regard to the fluid in his bilateral lower extremities. With that being said he's had three falls since I saw him last week. He also states that he's been feeling very poorly. I was concerned last week and feel like that the concern is still there as far as the congestion in his chest is concerned he seems to be breathing about the same as last  week but again he states he's very weak he's not even able to walk further than from the chair to the door. His wife had to buy a wheelchair just to be able to get them out of the house to get to the appointment today. This has me very concerned. 04/23/18 on evaluation today patient actually appears to be doing much better than last week's evaluation. At that time actually had to transport him to the ER via EMS and he subsequently was admitted for acute pulmonary edema, acute renal failure, and acute congestive heart failure. Fortunately he is doing much better. Apparently they did dialyze him and were  able to take off roughly 35 pounds of fluid. Nonetheless he is feeling much better both in regard to his breathing and he's able to get around much better at this time compared to previous. Overall I'm very happy with how things are at this time. There does not appear to be any evidence of infection currently. No fevers, chills, nausea, or vomiting noted at this time. 04/30/2018 patient seen today for follow-up and management of bilateral lower extremity lymphedema. He did express being more sad today than usual due to the recent loss of his dog. He states that he has been compliant with using the lymphedema pumps. However he does admit a minute over the last 2-3 days he has not been using the pumps due to the recent loss of his dog. At this time there is no drainage or open wounds to his lower extremities. The left leg edema is measuring smaller today. Still has a significant amount of edema on bilateral lower extremities With dry flaky skin. He will be referred to the lymphedema clinic for further management. Will continue 3 layer compression wraps and follow-up in 1-2 weeks.Denies any pain, fever, chairs recently. No recent falls or injuries reported during this visit. 05/07/18 on evaluation today patient actually appears to be doing very well in regard to his lower extremities in general all things considered. With that being said he is having some pain in the legs just due to the amount of swelling. He does have an area where he had a blister on the left lateral lower extremity this is open at this point other than that there's nothing else weeping at this time. Patient History Information obtained from Patient. Family History Cancer - Paternal Grandparents, Kidney Disease - Siblings, Lung Disease - Mother, No family history of Diabetes, Heart Disease, Hereditary Spherocytosis, Hypertension, Seizures, Stroke, Thyroid Problems, Tuberculosis. Social History Never smoker, Marital Status -  Married, Alcohol Use - Never, Drug Use - No History, Caffeine Use - Never. Medical History ZECHARIAH, BISSONNETTE ([id number]) Hospitalization/Surgery History - 11/03/2013, [facility], bronchitis. Medical And Surgical History Notes Respiratory home O2 at night as needed Cardiovascular varicose veins Neurologic CVA - 1992 Review of Systems (ROS) Constitutional Symptoms (General Health) Denies complaints or symptoms of Fever, Chills. Respiratory The patient has no complaints or symptoms. Cardiovascular Complains or has symptoms of LE edema. Psychiatric The patient has no complaints or symptoms. Objective Constitutional Well-nourished and well-hydrated in no acute distress. Vitals Time Taken: 2:55 PM, Height: 66 in, Weight: 323 lbs, BMI: 52.1, Temperature: 98.1 F, Pulse: 77 bpm, Respiratory Rate: 18 breaths/min, Blood Pressure: 166/80 mmHg. General Notes: pt stated forgot to take BP meds today. NAD at this time. Respiratory normal breathing without difficulty. clear to auscultation bilaterally. Cardiovascular regular rate and rhythm with normal S1, S2. Psychiatric this patient is able to make decisions and demonstrates good insight  into disease process. Alert and Oriented x 3. pleasant and cooperative. General Notes: Patient's lower [place] is not require sharp debridement at this point he does have significant swelling I still think lymphedema clinical be a good idea for him fortunately he did get in touch with the [facility] hospital and had his appointment where they are working on getting his Velcro compression wraps. Other Condition(s) Patient presents with Lymphedema located on the Bilateral Leg. The skin appearance exhibited: Hemosiderin Staining, Induration. The skin appearance did not exhibit: Atrophie Blanche, Callus, Crepitus, Cyanosis, Dry/Scaly, Ecchymosis, Erythema, Excoriation, Friable, Maceration, Mottled, Pallor, Rash, Rubor, Scarring. RAMSES, KLECKA ([id number]) Assessment Active  Problems ICD-10 Cellulitis of right lower limb Cellulitis of left lower limb Lymphedema, not elsewhere classified Obesity, unspecified Procedures There was a Three Layer Compression Therapy Procedure by Montey Hora, RN. Post procedure Diagnosis Wound #: Same as Pre-Procedure Plan Wound Cleansing: May Shower, gently pat wound dry prior to applying new dressing. - Please do not get your wraps wet but you can remove the wraps prior to nurse visits and wound clinic visits and shower and wash your legs Skin Barriers/Peri-Wound Care: Moisturizing lotion Secondary Dressing: XtraSorb - on any draining lymphedema Dressing Change Frequency: Other: - twice weekly - on Tuesdays and Fridays. Patient comes to [facility] on Tuesdays, [facility] to change wraps on Fridays Follow-up Appointments: Return Appointment in 1 week. Edema Control: 3 Layer Compression System - Bilateral - may anchor with unna if needed Compression Pump: Use compression pump on left lower extremity for 30 minutes, twice daily. - Please use for 1 hour twice daily Compression Pump: Use compression pump on right lower extremity for 30 minutes, twice daily. - Please use for 1 hour twice daily Home Health: [facility] Nurse may visit PRN to address patient s wound care needs. FACE TO FACE ENCOUNTER: MEDICARE and MEDICAID PATIENTS: I certify that this patient is under my care and that I had a face-to-face encounter that meets the physician face-to-face encounter requirements with this patient on this date. The encounter with the patient was in whole or in part for the following MEDICAL CONDITION: (primary reason for [place]) MEDICAL NECESSITY: I certify, that based on my findings, NURSING services are a medically necessary home health service. HOME BOUND STATUS: I certify that my clinical findings support that this patient is homebound (i.e., Due to illness or injury, pt requires  aid of supportive devices such as crutches, cane, wheelchairs, walkers, the use of special transportation or the assistance of another person to leave their place of residence. There is a normal inability to leave the home and doing so requires considerable and taxing effort. Other absences are for medical reasons / religious services and ROHAAN, DURNIL. ([id number]) are infrequent or of short duration when for other reasons). If current dressing causes regression in wound condition, may D/C ordered dressing product/s and apply Normal Saline Moist Dressing daily until next [place] / Other MD appointment. [place] of regression in wound condition at [phone]. Please direct any NON-WOUND related issues/requests for orders to patient's Primary Care Physician I'm gonna recommend currently that we continue with the above wound care measures and the patient is in agreement with plan. We will subtly see were things stand at follow-up. Please see above for specific wound care orders. We will see patient for re-evaluation in 1 week(s) here in the clinic. If anything worsens or changes patient will contact our office for  additional recommendations. Electronic Signature(s) Signed: 05/09/2018 1:15:36 AM By: Worthy Keeler PA-C Entered By: Worthy Keeler on 05/08/2018 21:32:02 Adam Merritt ([id number]) -------------------------------------------------------------------------------- ROS/PFSH Details Patient Name: Adam Merritt Date of Service: 05/07/2018 2:15 PM Medical Record Number: [id number] Patient Account Number: [id number] Date of Birth/Sex: Sep 03, 1945 (72 y.o. M) Treating RN: Montey Hora Primary Care Provider: [name] Other Clinician: Referring Provider: [name] Treating Provider/Extender: Melburn Hake, Ganesh Deeg [name]: 4 Information Obtained From Patient Wound History Do you currently have one or more open woundso No Have you tested  positive for osteomyelitis (bone infection)o No Have you had any tests for circulation on your legso Yes Constitutional Symptoms (General Health) Complaints and Symptoms: Negative for: Fever; Chills Cardiovascular Complaints and Symptoms: Positive for: LE edema Medical History: Positive for: Congestive Heart Failure; Coronary Artery Disease; Hypertension; Peripheral Venous Disease Negative for: Angina; Arrhythmia; Deep Vein Thrombosis; Hypotension; Myocardial Infarction; Peripheral Arterial Disease; Phlebitis; Vasculitis Past Medical History Notes: varicose veins Eyes Medical History: Negative for: Cataracts; Glaucoma; Optic Neuritis Ear/Nose/Mouth/Throat Medical History: Negative for: Chronic sinus problems/congestion; Middle ear problems Hematologic/Lymphatic Medical History: Positive for: Anemia - aplastic anemia; Lymphedema Negative for: Hemophilia; Human Immunodeficiency Virus; Sickle Cell Disease Respiratory Complaints and Symptoms: No Complaints or Symptoms Medical History: Positive for: Chronic Obstructive Pulmonary Disease (COPD); Sleep Apnea Negative for: Aspiration; Asthma; Pneumothorax; Tuberculosis Past Medical History Notes: LEOR, WHYTE ([id number]) home O2 at night as needed Gastrointestinal Medical History: Negative for: Cirrhosis ; Colitis; Crohnos; Hepatitis A; Hepatitis B; Hepatitis C Endocrine Medical History: Positive for: Type II Diabetes Negative for: Type I Diabetes Time with diabetes: since 2006 Treated with: Insulin Blood sugar tested every day: Yes Tested : Blood sugar testing results: Breakfast: 209 Genitourinary Medical History: Negative for: End Stage Renal Disease Immunological Medical History: Negative for: Lupus Erythematosus; Raynaudos; Scleroderma Integumentary (Skin) Medical History: Negative for: History of Burn; History of pressure wounds Musculoskeletal Medical History: Positive for: Osteoarthritis Negative for: Gout;  Rheumatoid Arthritis; Osteomyelitis Neurologic Medical History: Positive for: Neuropathy Negative for: Dementia; Quadriplegia; Paraplegia; Seizure Disorder Past Medical History Notes: CVA - 1992 Oncologic Medical History: Positive for: Received Chemotherapy - last dose 2006 Negative for: Received Radiation Psychiatric Complaints and Symptoms: No Complaints or Symptoms Medical History: GARLAN, DREWES ([id number]) Negative for: Anorexia/bulimia; Confinement Anxiety Immunizations Pneumococcal Vaccine: Received Pneumococcal Vaccination: Yes Implantable Devices Hospitalization / Surgery History Name of Hospital Purpose of Hospitalization/Surgery Date [facility] bronchitis 11/03/2013 Family and Social History Cancer: Yes - Paternal Grandparents; Diabetes: No; Heart Disease: No; Hereditary Spherocytosis: No; Hypertension: No; Kidney Disease: Yes - Siblings; Lung Disease: Yes - Mother; Seizures: No; Stroke: No; Thyroid Problems: No; Tuberculosis: No; Never smoker; Marital Status - Married; Alcohol Use: Never; Drug Use: No History; Caffeine Use: Never; Financial Concerns: No; Food, Clothing or Shelter Needs: No; Support System Lacking: No; Transportation Concerns: No; Advanced Directives: No; Patient does not want information on Advanced Directives Physician Affirmation I have reviewed and agree with the above information. Electronic Signature(s) Signed: 05/09/2018 1:15:36 AM By: Worthy Keeler PA-C Signed: 05/09/2018 5:28:33 PM By: Montey Hora Entered By: Worthy Keeler on 05/08/2018 21:31:00 Adam Merritt ([id number]) -------------------------------------------------------------------------------- SuperBill Details Patient Name: Adam Merritt Date of Service: 05/07/2018 Medical Record Number: [id number] Patient Account Number: [id number] Date of Birth/Sex: Jun 10, 1945 (72 y.o. M) Treating RN: Montey Hora Primary Care Provider: [name] Other Clinician: Referring Provider: [name] Treating Provider/Extender: Melburn Hake, Budd Freiermuth [name]: 4 Diagnosis Coding ICD-10 Codes Code Description L03.115 Cellulitis  of right lower limb L03.116 Cellulitis of left lower limb I89.0 Lymphedema, not elsewhere classified E66.9 Obesity, unspecified Facility Procedures CPT4: Description Modifier Quantity Code [id number] BILATERAL: Application of multi-layer venous compression system; leg (below 1 knee), including ankle and foot. Physician Procedures CPT4 Code: 5198242 Description: 99806 - WC PHYS LEVEL 4 - EST PT ICD-10 Diagnosis Description L03.115 Cellulitis of right lower limb L03.116 Cellulitis of left lower limb I89.0 Lymphedema, not elsewhere classified E66.9 Obesity, unspecified Modifier: Quantity: 1 Electronic Signature(s) Signed: 05/09/2018 1:15:36 AM By: Worthy Keeler PA-C Previous Signature: 05/07/2018 5:21:38 PM Version By: Montey Hora Entered By: Worthy Keeler on 05/08/2018 21:32:26

## 2018-05-12 NOTE — Progress Notes (Signed)
Patient ID: Adam Merritt, male    DOB: 1945/09/20, 72 y.o.   MRN: [id number]  HPI  Adam Merritt is a 72 y/o male with a history of CAD, DM, hyperlipidemia, HTN, stroke, COPD, lymphedema and chronic heart failure.   Echo report from 09/16/16 reviewed and showed an EF of 65-70%.  Admitted 04/16/18 due to acute on chronic HF. Cardiology consult obtained. Diuresed and discharged after 2 days.   He presents today for his initial visit with a chief complaint of moderate fatigue upon minimal exertion. He says that this has been present for several years. He has associated shortness of breath, light-headedness and chronic lymphedema along with this. He denies any difficulty sleeping, abdominal distention, palpitations or chest pain. Hasn't been weighing daily but does have scales at home. Says that he and his wife eat out "every meal" because neither of them like to cook. Tends to eat at [employer], [employer], [place], [place], [place] [employer] and [employer].  Past Medical History:  Diagnosis Date  . Anemia   . CAD (coronary artery disease)   . Chest pain   . Coronary artery disease   . Diabetes mellitus without complication ([place])   . Diastolic CHF ([place])   . Esophageal reflux   . Herpes zoster without mention of complication   . Hyperlipidemia   . Hypertension   . Insomnia   . Lumbago   . Lymphedema   . Neuropathy in diabetes ([place])   . Obstructive chronic bronchitis without exacerbation ([place])   . Other malaise and fatigue   . Stroke ([place])   . Varicose veins    Past Surgical History:  Procedure Laterality Date  . CHOLECYSTECTOMY    . CORONARY ARTERY BYPASS GRAFT     Family History  Problem Relation Age of Onset  . Diabetes Mother   . Hyperlipidemia Mother   . Hypertension Mother   . Diabetes Father   . Hyperlipidemia Father   . Hypertension Father   . CAD Father    Social History   Tobacco Use  . Smoking status: Never Smoker  . Smokeless tobacco: Never Used  Substance Use  Topics  . Alcohol use: No    Comment: quit alcohol 30 years ago   No Known Allergies Prior to Admission medications   Medication Sig Start Date End Date Taking? Authorizing Provider  albuterol (PROVENTIL HFA;VENTOLIN HFA) 108 (90 Base) MCG/ACT inhaler INHALE 1 PUFF INTO THE LUNGS EVERY 6 HOURS AS NEEDED FOR WHEEZING OR SHORTNESS OF BREATH Patient taking differently: Inhale 1 puff into the lungs every 6 (six) hours as needed for wheezing or shortness of breath.  03/11/18  Yes Allyne Gee, MD  aspirin 325 MG EC tablet Take 325 mg by mouth daily.   Yes [provider]  atorvastatin (LIPITOR) 40 MG tablet Take 40 mg by mouth at bedtime.    Yes [provider]  carvedilol (COREG) 25 MG tablet Take 25 mg by mouth 2 (two) times daily with a meal.   Yes [provider]  colchicine 0.6 MG tablet Take 1 tablet (0.6 mg total) by mouth daily. 08/31/17 08/31/18 Yes Dustin Flock, MD  docusate sodium (COLACE) 50 MG capsule Take 50 mg by mouth 2 (two) times daily.   Yes [provider]  febuxostat (ULORIC) 40 MG tablet Take 1 tablet (40 mg total) by mouth daily. 02/12/18  Yes [name], MD  fentaNYL (DURAGESIC - DOSED MCG/HR) 50 MCG/HR Place 1 patch (50  mcg total) onto the skin every 3 (three) days. 05/01/18  Yes Boscia, Greer Ee, NP  furosemide (LASIX) 40 MG tablet Take 1 tablet (40 mg total) by mouth daily. 04/18/18  Yes Max Sane, MD  gabapentin (NEURONTIN) 300 MG capsule Take 1 capsule (300 mg total) by mouth 2 (two) times daily. 05/01/18  Yes Boscia, Heather E, NP  hydrALAZINE (APRESOLINE) 25 MG tablet TAKE 1 TABLET BY MOUTH THREE TIMES DAILY FOR BLOOD PRESSURE Patient taking differently: Take 25 mg by mouth 3 (three) times daily.  02/12/18  Yes [name], MD  ibuprofen (ADVIL,MOTRIN) 600 MG tablet Take 600 mg by mouth 2 (two) times daily as needed for mild pain or moderate pain.    Yes [provider]  indapamide (LOZOL) 2.5 MG tablet Take 1 tablet  (2.5 mg total) by mouth daily. 11/27/17  Yes [name], MD  Insulin Glargine (BASAGLAR KWIKPEN) 100 UNIT/ML SOPN INJECT 30 TO 36 UNITS UNDER THE SKIN EVERY DAY Patient taking differently: Inject 30-36 Units into the skin daily.  03/26/18  Yes Scarboro, Audie Clear, NP  ipratropium-albuterol (DUONEB) 0.5-2.5 (3) MG/3ML SOLN Take 3 mLs by nebulization every 4 (four) hours as needed (for shortness of breath). 09/10/17  Yes [name], MD  Lancet Devices (ONE TOUCH DELICA LANCING DEV) MISC Use as directed 05/01/18  Yes Boscia, Greer Ee, NP  liraglutide (VICTOZA) 18 MG/3ML SOPN Inject 0.3 mLs (1.8 mg total) into the skin daily. 03/26/18  Yes Scarboro, Audie Clear, NP  metolazone (ZAROXOLYN) 2.5 MG tablet Take 1 tablet (2.5 mg total) by mouth daily. 02/12/18  Yes [name], MD  Multiple Vitamin (MULTIVITAMIN WITH MINERALS) TABS tablet Take 1 tablet by mouth daily.   Yes [provider]  omeprazole (PRILOSEC) 20 MG capsule Take 20 mg by mouth daily.   Yes [provider]  Jonetta Speak LANCETS 16X MISC Use twice daily diag e11.65 05/01/18  Yes Ronnell Freshwater, NP  Oxycodone HCl 10 MG TABS Half to one tab as needed prn for pain Patient taking differently: Half to one tab  prn for pain 11/28/17  Yes [name], MD  pentoxifylline (TRENTAL) 400 MG CR tablet TAKE 1 TABLET BY MOUTH THREE TIMES DAILY FOR CLAUDICATION Patient taking differently: Take 400 mg by mouth 3 (three) times daily.  03/11/18  Yes Boscia, Greer Ee, NP  potassium chloride SA (K-DUR,KLOR-CON) 20 MEQ tablet Take 1 tablet (20 mEq total) by mouth 2 (two) times daily. 05/01/18  Yes Boscia, Greer Ee, NP  sennosides-docusate sodium (SENOKOT-S) 8.6-50 MG tablet Take 1-2 tablets by mouth 2 (two) times daily.   Yes [provider]  UREA 20 INTENSIVE HYDRATING 20 % cream USE AS DIRECTED 3 TIMES A WEEK 09/12/17  Yes [name], MD  zolpidem (AMBIEN CR) 12.5 MG CR tablet Take 1 tablet (12.5 mg total) by mouth at bedtime  as needed for sleep. 03/08/18  Yes Kendell Bane, NP    Review of Systems  Constitutional: Positive for fatigue (tire easily). Negative for appetite change.  HENT: Positive for hearing loss. Negative for congestion and sore throat.   Eyes: Negative.   Respiratory: Positive for shortness of breath (with minimal exertion). Negative for chest tightness.   Cardiovascular: Positive for leg swelling. Negative for chest pain and palpitations.  Gastrointestinal: Negative for abdominal distention and abdominal pain.  Endocrine: Negative.   Genitourinary: Negative.   Musculoskeletal: Negative for back pain and neck pain.  Skin: Positive for wound (  open blisters on left lower leg).  Allergic/Immunologic: Negative.   Neurological: Positive for light-headedness. Negative for dizziness.  Hematological: Negative for adenopathy. Does not bruise/bleed easily.  Psychiatric/Behavioral: Negative for dysphoric mood and sleep disturbance (sleeping on 2 pillows; oxgyen on 4L at bedtime). The patient is not nervous/anxious.    Vitals:   05/13/18 1329  BP: 134/77  Pulse: 91  Resp: 18  SpO2: 98%  Weight: (!) 320 lb 8 oz (145.4 kg)  Height: 5\' 6"  (1.676 m)   Wt Readings from Last 3 Encounters:  05/13/18 (!) 320 lb 8 oz (145.4 kg)  05/01/18 (!) 307 lb (139.3 kg)  04/18/18 287 lb 12.8 oz (130.5 kg)   Lab Results  Component Value Date   CREATININE 1.93 (H) 04/18/2018   CREATININE 1.89 (H) 04/17/2018   CREATININE 2.20 (H) 04/16/2018   Physical Exam  Constitutional: He is oriented to person, place, and time. He appears well-developed and well-nourished.  HENT:  Head: Normocephalic and atraumatic.  Neck: Normal range of motion. Neck supple.  Cardiovascular: Normal rate and regular rhythm.  Pulmonary/Chest: Effort normal. No respiratory distress. He has no wheezes. He has no rales.  Abdominal: Soft. He exhibits no distension.  Musculoskeletal:       Right lower leg: He exhibits edema (lymphedema). He  exhibits no tenderness.       Left lower leg: He exhibits edema (lymphedema). He exhibits no tenderness.  Neurological: He is alert and oriented to person, place, and time.  Skin: Skin is warm and dry.  Psychiatric: He has a normal mood and affect. His behavior is normal.  Nursing note and vitals reviewed.   Assessment & Plan:  1: Chronic heart failure with preserved ejection fraction- - NYHA class III - euvolemic today although does have chronic lymphedema - not weighing daily but does have scales at home; reviewed the importance of weighing daily first thing in the morning after using the bathroom, write the weight down and call for an overnight weight gain of >2 pounds or a weekly weight gain of >5 pounds - adds salt "on occasion" but does eat out almost every meal because they don't like to cook. Reviewed extensively the importance of closely following a 2000mg  sodium diet and written dietary information was given to him about this. Given nutritional information on restaurants that they frequent often.  - currently has home health nurse coming - saw cardiology Lyndel Pleasure) 03/07/18 - BNP 04/16/18 was 29.0 - says that he's received his flu vaccine for this season   2: HTN- - BP looks good today - saw PCP Junius Creamer) 05/01/18 - BMP from 04/18/18 reviewed and showed sodium 138, potassium 2.9, creatinine 1.93 and GFR 33 - will check a BMP today  3: DM- - A1c 04/16/18 was 6.7%  4: COPD-  - wearing oxygen at 4L at bedtime - saw pulmonologist Versie Starks) 03/26/18  5: Lymphedema- - seen at the wound center 05/07/18 & returns tomorrow - [facility] the [facility] - is waiting on wrap around compression socks as he says that neither he nor his wife can get regular compression socks on  - does have compression boots at home that he tries to use when his legs aren't so swollen that they can't fit - hasn't been elevating his legs much during the day because he sits  at his computer desk most of the time; encouraged him to elevate his legs in a recliner a few times a day (reports improved edema  first thing in the morning after he's been in bed)  Patient did not bring his medications nor a list. Each medication was verbally reviewed with the patient and he was encouraged to bring the bottles to every visit to confirm accuracy of list.  Return in 1 month or sooner for any questions/problems before then.

## 2018-05-13 ENCOUNTER — Telehealth: Payer: Self-pay | Admitting: Family

## 2018-05-13 ENCOUNTER — Ambulatory Visit: Payer: Medicare Other | Attending: Family | Admitting: Family

## 2018-05-13 ENCOUNTER — Encounter: Payer: Self-pay | Admitting: Family

## 2018-05-13 VITALS — BP 134/77 | HR 91 | Resp 18 | Ht 66.0 in | Wt 320.5 lb

## 2018-05-13 DIAGNOSIS — Z79899 Other long term (current) drug therapy: Secondary | ICD-10-CM | POA: Diagnosis not present

## 2018-05-13 DIAGNOSIS — I890 Lymphedema, not elsewhere classified: Secondary | ICD-10-CM

## 2018-05-13 DIAGNOSIS — J449 Chronic obstructive pulmonary disease, unspecified: Secondary | ICD-10-CM | POA: Diagnosis not present

## 2018-05-13 DIAGNOSIS — Z951 Presence of aortocoronary bypass graft: Secondary | ICD-10-CM | POA: Insufficient documentation

## 2018-05-13 DIAGNOSIS — E785 Hyperlipidemia, unspecified: Secondary | ICD-10-CM | POA: Insufficient documentation

## 2018-05-13 DIAGNOSIS — I2510 Atherosclerotic heart disease of native coronary artery without angina pectoris: Secondary | ICD-10-CM | POA: Insufficient documentation

## 2018-05-13 DIAGNOSIS — Z8673 Personal history of transient ischemic attack (TIA), and cerebral infarction without residual deficits: Secondary | ICD-10-CM | POA: Diagnosis not present

## 2018-05-13 DIAGNOSIS — Z794 Long term (current) use of insulin: Secondary | ICD-10-CM | POA: Insufficient documentation

## 2018-05-13 DIAGNOSIS — E1140 Type 2 diabetes mellitus with diabetic neuropathy, unspecified: Secondary | ICD-10-CM | POA: Insufficient documentation

## 2018-05-13 DIAGNOSIS — I110 Hypertensive heart disease with heart failure: Secondary | ICD-10-CM | POA: Diagnosis not present

## 2018-05-13 DIAGNOSIS — I5032 Chronic diastolic (congestive) heart failure: Secondary | ICD-10-CM | POA: Diagnosis not present

## 2018-05-13 DIAGNOSIS — Z791 Long term (current) use of non-steroidal anti-inflammatories (NSAID): Secondary | ICD-10-CM | POA: Diagnosis not present

## 2018-05-13 DIAGNOSIS — E119 Type 2 diabetes mellitus without complications: Secondary | ICD-10-CM

## 2018-05-13 DIAGNOSIS — I10 Essential (primary) hypertension: Secondary | ICD-10-CM

## 2018-05-13 DIAGNOSIS — Z7982 Long term (current) use of aspirin: Secondary | ICD-10-CM | POA: Insufficient documentation

## 2018-05-13 DIAGNOSIS — Z8249 Family history of ischemic heart disease and other diseases of the circulatory system: Secondary | ICD-10-CM | POA: Insufficient documentation

## 2018-05-13 DIAGNOSIS — IMO0001 Reserved for inherently not codable concepts without codable children: Secondary | ICD-10-CM

## 2018-05-13 LAB — BASIC METABOLIC PANEL
Anion gap: 11 (ref 5–15)
BUN: 23 mg/dL (ref 8–23)
CO2: 28 mmol/L (ref 22–32)
Calcium: 8.9 mg/dL (ref 8.9–10.3)
Chloride: 99 mmol/L (ref 98–111)
Creatinine, Ser: 1.84 mg/dL — ABNORMAL HIGH (ref 0.61–1.24)
GFR calc Af Amer: 41 mL/min — ABNORMAL LOW (ref >60)
GFR calc non Af Amer: 36 mL/min — ABNORMAL LOW (ref >60)
Glucose, Bld: 229 mg/dL — ABNORMAL HIGH (ref 70–99)
POTASSIUM: 2.9 mmol/L — AB (ref 3.5–5.1)
Sodium: 138 mmol/L (ref 135–145)

## 2018-05-13 MED ORDER — POTASSIUM CHLORIDE CRYS ER 20 MEQ PO TBCR: 40.0000 meq | EXTENDED_RELEASE_TABLET | Freq: Two times a day (BID) | ORAL | 3 refills | Status: DC

## 2018-05-13 NOTE — Telephone Encounter (Signed)
Spoke with patient regarding lab results that were obtained today (05/13/18). Potassium remains low at 2.9 and he's currently taking 61meq potassium BID. Renal function has improved slightly.  Advised patient to increase his potassium to 51meq BID. He has a PCP appointment scheduled 05/27/18 and can have his labs rechecked on that day.   Patient verbalized understanding of medication change.

## 2018-05-13 NOTE — Patient Instructions (Addendum)
Begin weighing daily and call for an overnight weight gain of > 2 pounds or a weekly weight gain of >5 pounds.    Low-Sodium Eating Plan Sodium, which is an element that makes up salt, helps you maintain a healthy balance of fluids in your body. Too much sodium can increase your blood pressure and cause fluid and waste to be held in your body. Your health care provider or dietitian may recommend following this plan if you have high blood pressure (hypertension), kidney disease, liver disease, or heart failure. Eating less sodium can help lower your blood pressure, reduce swelling, and protect your heart, liver, and kidneys. What are tips for following this plan? General guidelines  Most people on this plan should limit their sodium intake to 2,000 mg (milligrams) of sodium each day. Reading food labels  The Nutrition Facts label lists the amount of sodium in one serving of the food. If you eat more than one serving, you must multiply the listed amount of sodium by the number of servings.  Choose foods with less than 140 mg of sodium per serving.  Avoid foods with 300 mg of sodium or more per serving. Shopping  Look for lower-sodium products, often labeled as "low-sodium" or "no salt added."  Always check the sodium content even if foods are labeled as "unsalted" or "no salt added".  Buy fresh foods. ? Avoid canned foods and premade or frozen meals. ? Avoid canned, cured, or processed meats  Buy breads that have less than 80 mg of sodium per slice. Cooking  Eat more home-cooked food and less restaurant, buffet, and fast food.  Avoid adding salt when cooking. Use salt-free seasonings or herbs instead of table salt or sea salt. Check with your health care provider or pharmacist before using salt substitutes.  Cook with plant-based oils, such as canola, sunflower, or olive oil. Meal planning  When eating at a restaurant, ask that your food be prepared with less salt or no salt, if  possible.  Avoid foods that contain MSG (monosodium glutamate). MSG is sometimes added to [place] food, bouillon, and some canned foods. What foods are recommended? The items listed may not be a complete list. Talk with your dietitian about what dietary choices are best for you. Grains Low-sodium cereals, including oats, puffed wheat and rice, and shredded wheat. Low-sodium crackers. Unsalted rice. Unsalted pasta. Low-sodium bread. Whole-grain breads and whole-grain pasta. Vegetables Fresh or frozen vegetables. "No salt added" canned vegetables. "No salt added" tomato sauce and paste. Low-sodium or reduced-sodium tomato and vegetable juice. Fruits Fresh, frozen, or canned fruit. Fruit juice. Meats and other protein foods Fresh or frozen (no salt added) meat, poultry, seafood, and fish. Low-sodium canned tuna and salmon. Unsalted nuts. Dried peas, beans, and lentils without added salt. Unsalted canned beans. Eggs. Unsalted nut butters. Dairy Milk. Soy milk. Cheese that is naturally low in sodium, such as ricotta cheese, fresh mozzarella, or Swiss cheese Low-sodium or reduced-sodium cheese. Cream cheese. Yogurt. Fats and oils Unsalted butter. Unsalted margarine with no trans fat. Vegetable oils such as canola or olive oils. Seasonings and other foods Fresh and dried herbs and spices. Salt-free seasonings. Low-sodium mustard and ketchup. Sodium-free salad dressing. Sodium-free light mayonnaise. Fresh or refrigerated horseradish. Lemon juice. Vinegar. Homemade, reduced-sodium, or low-sodium soups. Unsalted popcorn and pretzels. Low-salt or salt-free chips. What foods are not recommended? The items listed may not be a complete list. Talk with your dietitian about what dietary choices are best for you. Grains Instant hot  cereals. Bread stuffing, pancake, and biscuit mixes. Croutons. Seasoned rice or pasta mixes. Noodle soup cups. Boxed or frozen macaroni and cheese. Regular salted crackers.  Self-rising flour. Vegetables Sauerkraut, pickled vegetables, and relishes. Olives. [place] fries. Onion rings. Regular canned vegetables (not low-sodium or reduced-sodium). Regular canned tomato sauce and paste (not low-sodium or reduced-sodium). Regular tomato and vegetable juice (not low-sodium or reduced-sodium). Frozen vegetables in sauces. Meats and other protein foods Meat or fish that is salted, canned, smoked, spiced, or pickled. Bacon, ham, sausage, hotdogs, corned beef, chipped beef, packaged lunch meats, salt pork, jerky, pickled herring, anchovies, regular canned tuna, sardines, salted nuts. Dairy Processed cheese and cheese spreads. Cheese curds. Blue cheese. Feta cheese. String cheese. Regular cottage cheese. Buttermilk. Canned milk. Fats and oils Salted butter. Regular margarine. Ghee. Bacon fat. Seasonings and other foods Onion salt, garlic salt, seasoned salt, table salt, and sea salt. Canned and packaged gravies. Worcestershire sauce. Tartar sauce. Barbecue sauce. Teriyaki sauce. Soy sauce, including reduced-sodium. Steak sauce. Fish sauce. Oyster sauce. Cocktail sauce. Horseradish that you find on the shelf. Regular ketchup and mustard. Meat flavorings and tenderizers. Bouillon cubes. Hot sauce and Tabasco sauce. Premade or packaged marinades. Premade or packaged taco seasonings. Relishes. Regular salad dressings. Salsa. Potato and tortilla chips. Corn chips and puffs. Salted popcorn and pretzels. Canned or dried soups. Pizza. Frozen entrees and pot pies. Summary  Eating less sodium can help lower your blood pressure, reduce swelling, and protect your heart, liver, and kidneys.  Most people on this plan should limit their sodium intake to 1,500-2,000 mg (milligrams) of sodium each day.  Canned, boxed, and frozen foods are high in sodium. Restaurant foods, fast foods, and pizza are also very high in sodium. You also get sodium by adding salt to food.  Try to cook at home, eat  more fresh fruits and vegetables, and eat less fast food, canned, processed, or prepared foods. This information is not intended to replace advice given to you by your health care provider. Make sure you discuss any questions you have with your health care provider. Document Released: 11/11/2001 Document Revised: 05/15/2016 Document Reviewed: 05/15/2016 Elsevier Interactive Patient Education  [employer].

## 2018-05-14 ENCOUNTER — Encounter: Payer: Medicare Other | Admitting: Physician Assistant

## 2018-05-14 DIAGNOSIS — I890 Lymphedema, not elsewhere classified: Secondary | ICD-10-CM | POA: Diagnosis not present

## 2018-05-17 ENCOUNTER — Telehealth: Payer: Self-pay | Admitting: Nurse Practitioner

## 2018-05-17 NOTE — Telephone Encounter (Signed)
ot called from [facility] states that Adam Merritt had a fall on Wednesday night, no injury , just wants it documented in chart

## 2018-05-19 NOTE — Progress Notes (Signed)
ILHAN, MADAN ([id number]) Visit Report for 05/14/2018 Arrival Information Details Patient Name: LEROI, HAQUE Date of Service: 05/14/2018 12:45 PM Medical Record Number: [id number] Patient Account Number: [id number] Date of Birth/Sex: 04/20/1946 (72 y.o. M) Treating RN: Harold Barban Primary Care Colbi Schiltz: [name] Other Clinician: Referring Alejah Aristizabal: [name] Treating Dontavian Marchi/Extender: Melburn Hake, [name]: 5 Visit Information History Since Last Visit Added or deleted any medications: No Patient Arrived: Cane Any new allergies or adverse reactions: No Arrival Time: 12:48 Had a fall or experienced change in No Accompanied By: wife activities of daily living that may affect Transfer Assistance: None risk of falls: Patient Identification Verified: Yes Signs or symptoms of abuse/neglect since last visito No Secondary Verification Process Yes Hospitalized since last visit: No Completed: Pain Present Now: No Patient Has Alerts: Yes Patient Alerts: Patient on Blood Thinner Aspirin 325mg  Type II Diabetes ABI 5/19 L 1.1 R 0.9 TBI L 0.6 R 0.6 Electronic Signature(s) Signed: 05/14/2018 5:05:35 PM By: Harold Barban Entered By: Harold Barban on 05/14/2018 12:50:03 [name] ([id number]) -------------------------------------------------------------------------------- Compression Therapy Details Patient Name: [name] Date of Service: 05/14/2018 12:45 PM Medical Record Number: [id number] Patient Account Number: [id number] Date of Birth/Sex: 10-16-1945 (72 y.o. M) Treating RN: Montey Hora Primary Care Assyria Morreale: [name] Other Clinician: Referring Ashawnti Tangen: [name] Treating Rasool Rommel/Extender: Melburn Hake, [name]: 5 Compression Therapy Performed for Wound Assessment: NonWound Condition Lymphedema - Bilateral Leg Performed By: Clinician Montey Hora, RN Compression Type: Three Layer Post Procedure Diagnosis Same as  Pre-procedure Electronic Signature(s) Signed: 05/14/2018 5:28:58 PM By: Montey Hora Entered By: Montey Hora on 05/14/2018 13:15:51 [name] ([id number]) -------------------------------------------------------------------------------- Encounter Discharge Information Details Patient Name: [name] Date of Service: 05/14/2018 12:45 PM Medical Record Number: [id number] Patient Account Number: [id number] Date of Birth/Sex: 14-Jun-1945 (72 y.o. M) Treating RN: Montey Hora Primary Care Mersadez Linden: [name] Other Clinician: Referring Joslynne Klatt: [name] Treating Donzella Carrol/Extender: Melburn Hake, [name]: 5 Encounter Discharge Information Items Discharge Condition: Stable Ambulatory Status: Cane Discharge Destination: Home Transportation: Private Auto Accompanied By: wife Schedule Follow-up Appointment: Yes Clinical Summary of Care: Electronic Signature(s) Signed: 05/14/2018 5:28:58 PM By: Montey Hora Entered By: Montey Hora on 05/14/2018 13:42:24 [name] ([id number]) -------------------------------------------------------------------------------- Lower Extremity Assessment Details Patient Name: [name] Date of Service: 05/14/2018 12:45 PM Medical Record Number: [id number] Patient Account Number: [id number] Date of Birth/Sex: 1946/01/02 (72 y.o. M) Treating RN: Harold Barban Primary Care Shaurya Rawdon: [name] Other Clinician: Referring Dezarae Mcclaran: [name] Treating Merrin Mcvicker/Extender: Melburn Hake, [name]: 5 Edema Assessment Assessed: [Left: No] [Right: No] [Left: Edema] [Right: :] Calf Left: Right: Point of Measurement: 35 cm From Medial Instep 65 cm 61 cm Ankle Left: Right: Point of Measurement: 11 cm From Medial Instep 40 cm 33 cm Vascular Assessment Claudication: Claudication Assessment [Left:None] [Right:None] Pulses: Dorsalis Pedis Palpable: [Left:No] [Right:No] Posterior Tibial Palpable:  [Left:No] [Right:No] Extremity colors, hair growth, and conditions: Hair Growth on Extremity: [Left:No] [Right:No] Temperature of Extremity: [Left:Warm] [Right:Warm] Capillary Refill: [Left:< 3 seconds] [Right:< 3 seconds] Electronic Signature(s) Signed: 05/14/2018 5:05:35 PM By: Harold Barban Entered By: Harold Barban on 05/14/2018 12:55:04 [name] ([id number]) -------------------------------------------------------------------------------- Multi Wound Chart Details Patient Name: [name] Date of Service: 05/14/2018 12:45 PM Medical Record Number: [id number] Patient Account Number: [id number] Date of Birth/Sex: 01-30-1946 (72 y.o. M) Treating RN: Montey Hora Primary Care Cylie Dor: [name] Other Clinician: Referring Dymir Neeson: [name] Treating Liona Wengert/Extender: Melburn Hake, [name]  in Treatment: 5 Vital Signs Height(in): 66 Pulse(bpm): 79 Weight(lbs): 323 Blood Pressure(mmHg): 167/86 Body Mass Index(BMI): 52 Temperature(F): 98.0 Respiratory Rate 18 (breaths/min): Wound Assessments Treatment Notes Electronic Signature(s) Signed: 05/14/2018 5:28:58 PM By: Montey Hora Entered By: Montey Hora on 05/14/2018 13:12:53 [name] ([id number]) -------------------------------------------------------------------------------- [place] Details Patient Name: [name] Date of Service: 05/14/2018 12:45 PM Medical Record Number: [id number] Patient Account Number: [id number] Date of Birth/Sex: 1945-08-23 (72 y.o. M) Treating RN: Montey Hora Primary Care Marlean Mortell: [name] Other Clinician: Referring Ahlana Slaydon: [name] Treating Fishel Wamble/Extender: Melburn Hake, [name]: 5 Active Inactive Abuse / Safety / Falls / Self Care Management Nursing Diagnoses: Potential for falls Goals: Patient will remain injury free related to falls Date Initiated: 04/09/2018 Target Resolution Date: 07/13/2018 Goal Status:  Active Interventions: Assess fall risk on admission and as needed Notes: Orientation to the Wound Care Program Nursing Diagnoses: Knowledge deficit related to the wound healing center program Goals: Patient/caregiver will verbalize understanding of the [place] Program Date Initiated: 04/09/2018 Target Resolution Date: 07/13/2018 Goal Status: Active Interventions: Provide education on orientation to the wound center Notes: Venous Leg Ulcer Nursing Diagnoses: Actual venous Insuffiency (use after diagnosis is confirmed) Goals: Patient will maintain optimal edema control Date Initiated: 04/09/2018 Target Resolution Date: 07/13/2018 Goal Status: Active Interventions: Assess peripheral edema status every visit. KAMAREON, SCIANDRA ([id number]) Compression as ordered Provide education on venous insufficiency Notes: Electronic Signature(s) Signed: 05/14/2018 5:28:58 PM By: Montey Hora Entered By: Montey Hora on 05/14/2018 13:12:46 [name] ([id number]) -------------------------------------------------------------------------------- Non-Wound Condition Assessment Details Patient Name: [name] Date of Service: 05/14/2018 12:45 PM Medical Record Number: [id number] Patient Account Number: [id number] Date of Birth/Sex: August 07, 1945 (72 y.o. M) Treating RN: Harold Barban Primary Care Mory Herrman: [name] Other Clinician: Referring Kirsten Spearing: [name] Treating Othman Masur/Extender: STONE III, [name]: 5 Non-Wound Condition: Condition: Lymphedema Location: Leg Side: Bilateral Photos Periwound Skin Texture Texture Color No Abnormalities Noted: No No Abnormalities Noted: No Callus: No Atrophie Blanche: No Crepitus: No Cyanosis: No Excoriation: No Ecchymosis: No Friable: No Erythema: No Induration: Yes Hemosiderin Staining: Yes Rash: No Mottled: No Scarring: No Pallor: No Rubor: No Moisture No Abnormalities Noted: No Temperature /  Pain Dry / Scaly: No Temperature: No Abnormality Maceration: No Electronic Signature(s) Signed: 05/14/2018 5:05:35 PM By: Harold Barban Entered By: Harold Barban on 05/14/2018 16:44:51 [name] ([id number]) -------------------------------------------------------------------------------- Pain Assessment Details Patient Name: [name] Date of Service: 05/14/2018 12:45 PM Medical Record Number: [id number] Patient Account Number: [id number] Date of Birth/Sex: Jul 18, 1945 (72 y.o. M) Treating RN: Harold Barban Primary Care Forney Kleinpeter: [name] Other Clinician: Referring Nolon Yellin: [name] Treating Dung Salinger/Extender: Melburn Hake, [name]: 5 Active Problems Location of Pain Severity and Description of Pain Patient Has Paino No Site Locations Pain Management and Medication Current Pain Management: Electronic Signature(s) Signed: 05/14/2018 5:05:35 PM By: Harold Barban Entered By: Harold Barban on 05/14/2018 12:50:59 [name] ([id number]) -------------------------------------------------------------------------------- Patient/Caregiver Education Details Patient Name: [name] Date of Service: 05/14/2018 12:45 PM Medical Record Number: [id number] Patient Account Number: [id number] Date of Birth/Gender: [birth year] (72 y.o. M) Treating RN: Montey Hora Primary Care Physician: [name] Other Clinician: Referring Physician: [name] Treating Physician/Extender: Sharalyn Ink in Treatment: 5 Education Assessment Education Provided To: Patient and Caregiver Education Topics Provided Venous: Handouts: Other: need for ymphedema clinic Methods: Explain/Verbal Responses: State content correctly Electronic Signature(s) Signed: 05/14/2018 5:28:58 PM By: Montey Hora Entered By:  Montey Hora on 05/14/2018 13:41:43 KAEDYN, POLIVKA  ([id number]) -------------------------------------------------------------------------------- [place] Details Patient Name: JANES, COLEGROVE Date of Service: 05/14/2018 12:45 PM Medical Record Number: [id number] Patient Account Number: [id number] Date of Birth/Sex: Mar 25, 1946 (72 y.o. M) Treating RN: Harold Barban Primary Care Azizah Lisle: [name] Other Clinician: Referring Anupama Piehl: [name] Treating Kiril Hippe/Extender: Melburn Hake, [name]: 5 Vital Signs Time Taken: 12:50 Temperature (F): 98.0 Height (in): 66 Pulse (bpm): 79 Weight (lbs): 323 Respiratory Rate (breaths/min): 18 Body Mass Index (BMI): 52.1 Blood Pressure (mmHg): 167/86 Reference Range: 80 - 120 mg / dl Electronic Signature(s) Signed: 05/14/2018 5:05:35 PM By: Harold Barban Entered By: Harold Barban on 05/14/2018 12:51:49

## 2018-05-19 NOTE — Progress Notes (Signed)
Adam, Merritt ([id number]) Visit Report for 05/14/2018 Chief Complaint Document Details Patient Name: Adam Merritt, Adam Merritt Date of Service: 05/14/2018 12:45 PM Medical Record Number: [id number] Patient Account Number: [id number] Date of Birth/Sex: 1945/09/07 (72 y.o. M) Treating RN: Montey Hora Primary Care Provider: [name] Other Clinician: Referring Provider: [name] Treating Provider/Extender: Melburn Hake, Wilhelmine Krogstad [name]: 5 Information Obtained from: Patient Chief Complaint he is here for evaluation of bilateral lower extremity lymphedema Electronic Signature(s) Signed: 05/17/2018 8:01:46 PM By: Worthy Keeler PA-C Entered By: Worthy Keeler on 05/14/2018 13:10:02 Adam Merritt ([id number]) -------------------------------------------------------------------------------- HPI Details Patient Name: Adam Merritt Date of Service: 05/14/2018 12:45 PM Medical Record Number: [id number] Patient Account Number: [id number] Date of Birth/Sex: Mar 16, 1946 (72 y.o. M) Treating RN: Montey Hora Primary Care Provider: [name] Other Clinician: Referring Provider: [name] Treating Provider/Extender: Melburn Hake, Tristen Pennino [name]: 5 History of Present Illness HPI Description: 10/18/17-He is here for initial evaluation of bilateral lower extremity ulcerations in the presence of venous insufficiency and lymphedema. He has been seen by vascular medicine in the past, Dr. Lucky Cowboy, last seen in 2016. He does have a history of abnormal ABIs, which is to be expected given his lymphedema and venous insufficiency. According to Epic, it appears that all attempts for arterial evaluation and/or angiography were not follow through with by patient. He does have a history of being seen in lymphedema clinic in 2018, stopped going approximately 6 months ago stating "it didn't do any good". He does not have lymphedema pumps, he does not have custom fit compression wrap/stockings. He is  diabetic and his recent A1c last month was 7.6. He admits to chronic bilateral lower extremity pain, no change in pain since blister and ulceration development. He is currently being treated with Levaquin for bronchitis. He has home health and we will continue. 10/25/17-He is here in follow-up evaluation for bilateral lower extremity ulcerationssubtle he remains on Levaquin for bronchitis. Right lower extremity with no evidence of drainage or ulceration, persistent left lower extremity ulceration. He states that home health has not been out since his appointment. He went to [facility] on Tuesday, studies revealed: RIGHT ABI 0.9, TBI 0.6 LEFT ABI 1.1, TBI 0.6 with triphasic flow bilaterally. We will continue his same treatment plan. He has been educated on compression therapy and need for elevation. He will benefit from lymphedema pumps 11/01/17-He is here in follow-up evaluation for left lower extremity ulcer. The right lower extremity remains healed. He has home health services, but they have not been out to see the patient for 2-3 weeks. He states it home health physical therapy changed his dressing yesterday after therapy; he placed Ace wrap compression. We are still waiting for lymphedema pumps, reordered d/t need for company change. 11/08/17-He is here in follow-up evaluation for left lower extremity ulcer. It is improved. Edema is significantly improved with compression therapy. We will continue with same treatment plan and he will follow-up next week. No word regarding lymphedema pumps 11/15/17-He is here in follow-up evaluation for left lower extremity ulcer. He is healed and will be discharged from wound care services. I have reached out to [facility] regarding his lymphedema pumps. They have been unable to reach the patient; the contact number they had with the patient's wife's cell phone and she has not answered any unrecognized calls. Contact should be made today,  trial planned for next week; [facility] will continue to follow 11/27/17 on evaluation today patient has  multiple blistered areas over the right lower extremity his left lower extremity appears to be doing okay. These blistered areas show signs of no infection which is great news. With that being said he did have some necrotic skin overlying which was mechanically debrided away with saline and gauze today without complication. Overall post debridement the wounds appear to be doing better but in general his swelling seems to be increased. This is obviously not good news. I think this is what has given rise to the blisters. 12/04/17 on evaluation today patient presents for follow-up concerning his bilateral lower extremity edema in the right lower extremity ulcers. He has been tolerating the dressing changes without complication. With that being said he has had no real issues with the wraps which is also good news. Overall I'm pleased with the progress he's been making. 12/11/17 on evaluation today patient appears to be doing rather well in regard to his right lateral lower extremity ulcer. He's been tolerating the dressing changes without complication. Fortunately there does not appear to be any evidence of infection at this time. Overall I'm pleased with the progress that is being made. Unfortunately he has been in the hospital due to having what sounds to be a stomach virus/flu fortunately that is starting to get better. 12/18/17 on evaluation today patient actually appears to be doing very well in regard to his bilateral lower extremities the swelling is under fairly good control his lymphedema pumps are still not up and running quite yet. With that being said he does have several areas of opening noted as far as wounds are concerned mainly over the left lower extremity. With that being said I do believe once he gets lymphedema pumps this would least help you mention some the fluid and preventing  this from occurring. Hopefully that will be set up soon sleeves are Artie in place at his home he just waiting for the machine. 12/25/17 on evaluation today patient actually appears to be doing excellent in fact all of his ulcers appear to have resolved his CORRY, IHNEN. ([id number]) legs appear very well. I do think he needs compression stockings we have discussed this and they are actually going to go to [place] today to elastic therapy to get this fitted for him. I think that is definitely a good thing to do. Readmission: 04/09/18 upon evaluation today this patient is seen for readmission due to bilateral lower extremity lymphedema. He has significant swelling of his extremities especially on the left although the right is also swollen he has weeping from both sides. There are no obvious open wounds at this point. Fortunately he has been doing fairly well for quite a bit of time since I last saw him. Nonetheless unfortunately this seems to have reopened and is giving quite a bit of trouble. He states this began about a week ago when he first called [place] to get in to be seen. No fevers, chills, nausea, or vomiting noted at this time. He has not been using his lymphedema pumps due to the fact that they won't fit on his leg at this point likewise is also not been using his compression for essentially the same reason. 04/16/18 upon evaluation today patient actually appears to be doing a little better in regard to the fluid in his bilateral lower extremities. With that being said he's had three falls since I saw him last week. He also states that he's been feeling very poorly. I was concerned last week and feel like that  the concern is still there as far as the congestion in his chest is concerned he seems to be breathing about the same as last week but again he states he's very weak he's not even able to walk further than from the chair to the door. His wife had to buy a wheelchair just to be able to  get them out of the house to get to the appointment today. This has me very concerned. 04/23/18 on evaluation today patient actually appears to be doing much better than last week's evaluation. At that time actually had to transport him to the ER via EMS and he subsequently was admitted for acute pulmonary edema, acute renal failure, and acute congestive heart failure. Fortunately he is doing much better. Apparently they did dialyze him and were able to take off roughly 35 pounds of fluid. Nonetheless he is feeling much better both in regard to his breathing and he's able to get around much better at this time compared to previous. Overall I'm very happy with how things are at this time. There does not appear to be any evidence of infection currently. No fevers, chills, nausea, or vomiting noted at this time. 04/30/2018 patient seen today for follow-up and management of bilateral lower extremity lymphedema. He did express being more sad today than usual due to the recent loss of his dog. He states that he has been compliant with using the lymphedema pumps. However he does admit a minute over the last 2-3 days he has not been using the pumps due to the recent loss of his dog. At this time there is no drainage or open wounds to his lower extremities. The left leg edema is measuring smaller today. Still has a significant amount of edema on bilateral lower extremities With dry flaky skin. He will be referred to the lymphedema clinic for further management. Will continue 3 layer compression wraps and follow-up in 1-2 weeks.Denies any pain, fever, chairs recently. No recent falls or injuries reported during this visit. 05/07/18 on evaluation today patient actually appears to be doing very well in regard to his lower extremities in general all things considered. With that being said he is having some pain in the legs just due to the amount of swelling. He does have an area where he had a blister on the left  lateral lower extremity this is open at this point other than that there's nothing else weeping at this time. 05/14/18 on evaluation today patient actually appears to be doing excellent all things considered in regard to his lower extremities. He still has a couple areas of weeping on each leg which has continued to be the issue for him. He does have an appointment with the lymphedema clinic although this isn't until February 2020. That was the earliest they had. In the meantime he has continued to tolerate the compression wraps without complication. Electronic Signature(s) Signed: 05/17/2018 8:01:46 PM By: Worthy Keeler PA-C Entered By: Worthy Keeler on 05/14/2018 13:30:45 Adam Merritt ([id number]) -------------------------------------------------------------------------------- Physical Exam Details Patient Name: Adam Merritt Date of Service: 05/14/2018 12:45 PM Medical Record Number: [id number] Patient Account Number: [id number] Date of Birth/Sex: Dec 17, 1945 (72 y.o. M) Treating RN: Montey Hora Primary Care Provider: [name] Other Clinician: Referring Provider: [name] Treating Provider/Extender: STONE III, Vijay Durflinger [name]: 5 Constitutional Obese and well-hydrated in no acute distress. Respiratory normal breathing without difficulty. clear to auscultation bilaterally. Cardiovascular regular rate and rhythm with normal S1, S2. Bilateral stage 3 lymphedema.  Psychiatric this patient is able to make decisions and demonstrates good insight into disease process. Alert and Oriented x 3. pleasant and cooperative. Notes Patient's legs currently do not show any evidence of open wounds he just has several areas of weeping noted at this point. The compression wraps do seem to be helping although I do believe he may be better served that the lymphedema clinic then even here. Electronic Signature(s) Signed: 05/17/2018 8:01:46 PM By: Worthy Keeler PA-C Entered By:  Worthy Keeler on 05/14/2018 13:31:37 Adam Merritt ([id number]) -------------------------------------------------------------------------------- Physician Orders Details Patient Name: Adam Merritt Date of Service: 05/14/2018 12:45 PM Medical Record Number: [id number] Patient Account Number: [id number] Date of Birth/Sex: [birth year] (72 y.o. M) Treating RN: Montey Hora Primary Care Provider: [name] Other Clinician: Referring Provider: [name] Treating Provider/Extender: Melburn Hake, Janyla Biscoe [name]: 5 Verbal / Phone Orders: No Diagnosis Coding ICD-10 Coding Code Description L03.115 Cellulitis of right lower limb L03.116 Cellulitis of left lower limb I89.0 Lymphedema, not elsewhere classified E66.9 Obesity, unspecified Wound Cleansing o May Shower, gently pat wound dry prior to applying new dressing. - Please do not get your wraps wet but you can remove the wraps prior to nurse visits and wound clinic visits and shower and wash your legs Skin Barriers/Peri-Wound Care o Moisturizing lotion Secondary Dressing o XtraSorb - on any draining lymphedema Dressing Change Frequency o Other: - twice weekly - on Tuesdays and Fridays. Patient comes to [place] on Tuesdays, [facility] to change wraps on Fridays Follow-up Appointments o Return Appointment in 2 weeks. - H[facility]N to visit patient twice weekly for wrap changes on weeks that patient does not come into [facility] Edema Control o 3 Layer Compression System - Bilateral - may anchor with unna if needed o Compression Pump: Use compression pump on left lower extremity for 30 minutes, twice daily. - Please use for 1 hour twice daily o Compression Pump: Use compression pump on right lower extremity for 30 minutes, twice daily. - Please use for 1 hour twice daily [place] Nurse may visit PRN to address patientos wound care  needs. o FACE TO FACE ENCOUNTER: MEDICARE and MEDICAID PATIENTS: I certify that this patient is under my care and that I had a face-to-face encounter that meets the physician face-to-face encounter requirements with this patient on this date. The encounter with the patient was in whole or in part for the following MEDICAL CONDITION: (primary reason for [place]) MEDICAL NECESSITY: I certify, that based on my findings, NURSING services are a medically necessary home health service. HOME BOUND STATUS: I certify that my clinical findings support that this patient is homebound (i.e., Due to illness or injury, pt requires aid of supportive devices such as crutches, cane, wheelchairs, walkers, the use of special transportation or the assistance of another person to leave their place of residence. There is a normal inability to leave the home and doing so requires considerable and taxing effort. Other absences are for medical reasons / religious services and are infrequent or of short duration when for other reasons). VIGGO, PERKO ([id number]) o If current dressing causes regression in wound condition, may D/C ordered dressing product/s and apply Normal Saline Moist Dressing daily until next [employer] / Other MD appointment. [place] of regression in wound condition at [phone]. o Please direct any NON-WOUND related issues/requests for orders to patient's Primary Care Physician  Electronic Signature(s) Signed: 05/14/2018 5:28:58 PM By: Montey Hora Signed: 05/17/2018 8:01:46 PM By: Worthy Keeler PA-C Entered By: Montey Hora on 05/14/2018 13:15:21 Adam Merritt ([id number]) -------------------------------------------------------------------------------- Problem List Details Patient Name: Adam Merritt Date of Service: 05/14/2018 12:45 PM Medical Record Number: [id number] Patient Account Number: [id number] Date of Birth/Sex: [birth year] (72 y.o.  M) Treating RN: Montey Hora Primary Care Provider: [name] Other Clinician: Referring Provider: [name] Treating Provider/Extender: Melburn Hake, Shaquera Ansley [name]: 5 Active Problems ICD-10 Evaluated Encounter Code Description Active Date Today Diagnosis L03.115 Cellulitis of right lower limb 04/09/2018 No Yes L03.116 Cellulitis of left lower limb 04/09/2018 No Yes I89.0 Lymphedema, not elsewhere classified 04/09/2018 No Yes E66.9 Obesity, unspecified 04/09/2018 No Yes Inactive Problems Resolved Problems Electronic Signature(s) Signed: 05/17/2018 8:01:46 PM By: Worthy Keeler PA-C Entered By: Worthy Keeler on 05/14/2018 13:09:57 Adam Merritt ([id number]) -------------------------------------------------------------------------------- Progress Note Details Patient Name: Adam Merritt Date of Service: 05/14/2018 12:45 PM Medical Record Number: [id number] Patient Account Number: [id number] Date of Birth/Sex: April 14, 1946 (72 y.o. M) Treating RN: Montey Hora Primary Care Provider: [name] Other Clinician: Referring Provider: [name] Treating Provider/Extender: Melburn Hake, Trinda Harlacher [name]: 5 Subjective Chief Complaint Information obtained from Patient he is here for evaluation of bilateral lower extremity lymphedema History of Present Illness (HPI) 10/18/17-He is here for initial evaluation of bilateral lower extremity ulcerations in the presence of venous insufficiency and lymphedema. He has been seen by vascular medicine in the past, Dr. Lucky Cowboy, last seen in 2016. He does have a history of abnormal ABIs, which is to be expected given his lymphedema and venous insufficiency. According to Epic, it appears that all attempts for arterial evaluation and/or angiography were not follow through with by patient. He does have a history of being seen in lymphedema clinic in 2018, stopped going approximately 6 months ago stating "it didn't do any good". He does  not have lymphedema pumps, he does not have custom fit compression wrap/stockings. He is diabetic and his recent A1c last month was 7.6. He admits to chronic bilateral lower extremity pain, no change in pain since blister and ulceration development. He is currently being treated with Levaquin for bronchitis. He has home health and we will continue. 10/25/17-He is here in follow-up evaluation for bilateral lower extremity ulcerationssubtle he remains on Levaquin for bronchitis. Right lower extremity with no evidence of drainage or ulceration, persistent left lower extremity ulceration. He states that home health has not been out since his appointment. [facility], studies revealed: RIGHT ABI 0.9, TBI 0.6 LEFT ABI 1.1, TBI 0.6 with triphasic flow bilaterally. We will continue his same treatment plan. He has been educated on compression therapy and need for elevation. He will benefit from lymphedema pumps 11/01/17-He is here in follow-up evaluation for left lower extremity ulcer. The right lower extremity remains healed. He has home health services, but they have not been out to see the patient for 2-3 weeks. He states it home health physical therapy changed his dressing yesterday after therapy; he placed Ace wrap compression. We are still waiting for lymphedema pumps, reordered d/t need for company change. 11/08/17-He is here in follow-up evaluation for left lower extremity ulcer. It is improved. Edema is significantly improved with compression therapy. We will continue with same treatment plan and he will follow-up next week. No word regarding lymphedema pumps 11/15/17-He is here in follow-up evaluation for left lower extremity ulcer. He is  healed and will be discharged from wound care services. I have reached out to [facility] regarding his lymphedema pumps. They have been unable to reach the patient; the contact number they had with the patient's wife's cell  phone and she has not answered any unrecognized calls. Contact should be made today, trial planned for next week; [facility] will continue to follow 11/27/17 on evaluation today patient has multiple blistered areas over the right lower extremity his left lower extremity appears to be doing okay. These blistered areas show signs of no infection which is great news. With that being said he did have some necrotic skin overlying which was mechanically debrided away with saline and gauze today without complication. Overall post debridement the wounds appear to be doing better but in general his swelling seems to be increased. This is obviously not good news. I think this is what has given rise to the blisters. 12/04/17 on evaluation today patient presents for follow-up concerning his bilateral lower extremity edema in the right lower extremity ulcers. He has been tolerating the dressing changes without complication. With that being said he has had no real issues with the wraps which is also good news. Overall I'm pleased with the progress he's been making. 12/11/17 on evaluation today patient appears to be doing rather well in regard to his right lateral lower extremity ulcer. He's been tolerating the dressing changes without complication. Fortunately there does not appear to be any evidence of infection at this time. Overall I'm pleased with the progress that is being made. Unfortunately he has been in the hospital due to having what sounds to be a stomach virus/flu fortunately that is starting to get better. 12/18/17 on evaluation today patient actually appears to be doing very well in regard to his bilateral lower extremities the TALOR, CHEEMA. ([id number]) swelling is under fairly good control his lymphedema pumps are still not up and running quite yet. With that being said he does have several areas of opening noted as far as wounds are concerned mainly over the left lower extremity. With  that being said I do believe once he gets lymphedema pumps this would least help you mention some the fluid and preventing this from occurring. Hopefully that will be set up soon sleeves are Artie in place at his home he just waiting for the machine. 12/25/17 on evaluation today patient actually appears to be doing excellent in fact all of his ulcers appear to have resolved his legs appear very well. I do think he needs compression stockings we have discussed this and they are actually going to go to  today to elastic therapy to get this fitted for him. I think that is definitely a good thing to do. Readmission: 04/09/18 upon evaluation today this patient is seen for readmission due to bilateral lower extremity lymphedema. He has significant swelling of his extremities especially on the left although the right is also swollen he has weeping from both sides. There are no obvious open wounds at this point. Fortunately he has been doing fairly well for quite a bit of time since I last saw him. Nonetheless unfortunately this seems to have reopened and is giving quite a bit of trouble. He states this began about a week ago when he first called [place] to get in to be seen. No fevers, chills, nausea, or vomiting noted at this time. He has not been using his lymphedema pumps due to the fact that they won't fit on his leg at  this point likewise is also not been using his compression for essentially the same reason. 04/16/18 upon evaluation today patient actually appears to be doing a little better in regard to the fluid in his bilateral lower extremities. With that being said he's had three falls since I saw him last week. He also states that he's been feeling very poorly. I was concerned last week and feel like that the concern is still there as far as the congestion in his chest is concerned he seems to be breathing about the same as last week but again he states he's very weak he's not even able to walk  further than from the chair to the door. His wife had to buy a wheelchair just to be able to get them out of the house to get to the appointment today. This has me very concerned. 04/23/18 on evaluation today patient actually appears to be doing much better than last week's evaluation. At that time actually had to transport him to the ER via EMS and he subsequently was admitted for acute pulmonary edema, acute renal failure, and acute congestive heart failure. Fortunately he is doing much better. Apparently they did dialyze him and were able to take off roughly 35 pounds of fluid. Nonetheless he is feeling much better both in regard to his breathing and he's able to get around much better at this time compared to previous. Overall I'm very happy with how things are at this time. There does not appear to be any evidence of infection currently. No fevers, chills, nausea, or vomiting noted at this time. 04/30/2018 patient seen today for follow-up and management of bilateral lower extremity lymphedema. He did express being more sad today than usual due to the recent loss of his dog. He states that he has been compliant with using the lymphedema pumps. However he does admit a minute over the last 2-3 days he has not been using the pumps due to the recent loss of his dog. At this time there is no drainage or open wounds to his lower extremities. The left leg edema is measuring smaller today. Still has a significant amount of edema on bilateral lower extremities With dry flaky skin. He will be referred to the lymphedema clinic for further management. Will continue 3 layer compression wraps and follow-up in 1-2 weeks.Denies any pain, fever, chairs recently. No recent falls or injuries reported during this visit. 05/07/18 on evaluation today patient actually appears to be doing very well in regard to his lower extremities in general all things considered. With that being said he is having some pain in the  legs just due to the amount of swelling. He does have an area where he had a blister on the left lateral lower extremity this is open at this point other than that there's nothing else weeping at this time. 05/14/18 on evaluation today patient actually appears to be doing excellent all things considered in regard to his lower extremities. He still has a couple areas of weeping on each leg which has continued to be the issue for him. He does have an appointment with the lymphedema clinic although this isn't until February 2020. That was the earliest they had. In the meantime he has continued to tolerate the compression wraps without complication. Patient History Information obtained from Patient. Family History Cancer - Paternal Grandparents, Kidney Disease - Siblings, Lung Disease - Mother, No family history of Diabetes, Heart Disease, Hereditary Spherocytosis, Hypertension, Seizures, Stroke, Thyroid Problems, Tuberculosis. [name]  W. ([id number]) Social History Never smoker, Marital Status - Married, Alcohol Use - Never, Drug Use - No History, Caffeine Use - Never. Medical History Hospitalization/Surgery History - 11/03/2013, [facility], bronchitis. Medical And Surgical History Notes Respiratory home O2 at night as needed Cardiovascular varicose veins Neurologic CVA - 1992 Review of Systems (ROS) Constitutional Symptoms (General Health) Denies complaints or symptoms of Fever, Chills. Respiratory The patient has no complaints or symptoms. Cardiovascular Complains or has symptoms of LE edema. Psychiatric The patient has no complaints or symptoms. Objective Constitutional Obese and well-hydrated in no acute distress. Vitals Time Taken: 12:50 PM, Height: 66 in, Weight: 323 lbs, BMI: 52.1, Temperature: 98.0 F, Pulse: 79 bpm, Respiratory Rate: 18 breaths/min, Blood Pressure: 167/86 mmHg. Respiratory normal breathing without difficulty. clear to auscultation  bilaterally. Cardiovascular regular rate and rhythm with normal S1, S2. Bilateral stage 3 lymphedema. Psychiatric this patient is able to make decisions and demonstrates good insight into disease process. Alert and Oriented x 3. pleasant and cooperative. General Notes: Patient's legs currently do not show any evidence of open wounds he just has several areas of weeping noted at this point. The compression wraps do seem to be helping although I do believe he may be better served that the lymphedema clinic then even here. Other Condition(s) Patient presents with Lymphedema located on the Bilateral Leg. The skin appearance exhibited: Hemosiderin Staining, JAYMIE, MISCH. ([id number]) Induration. The skin appearance did not exhibit: Atrophie Blanche, Callus, Crepitus, Cyanosis, Dry/Scaly, Ecchymosis, Erythema, Excoriation, Friable, Maceration, Mottled, Pallor, Rash, Rubor, Scarring. Skin temperature was noted as No Abnormality. Assessment Active Problems ICD-10 Cellulitis of right lower limb Cellulitis of left lower limb Lymphedema, not elsewhere classified Obesity, unspecified Procedures There was a Three Layer Compression Therapy Procedure by Montey Hora, RN. Post procedure Diagnosis Wound #: Same as Pre-Procedure Plan Wound Cleansing: May Shower, gently pat wound dry prior to applying new dressing. - Please do not get your wraps wet but you can remove the wraps prior to nurse visits and wound clinic visits and shower and wash your legs Skin Barriers/Peri-Wound Care: Moisturizing lotion Secondary Dressing: XtraSorb - on any draining lymphedema Dressing Change Frequency: Other: - twice weekly - on Tuesdays and Fridays. Patient comes to [place] on Tuesdays, [facility] to change wraps on Fridays Follow-up Appointments: Return Appointment in 2 weeks. - [facility]N to visit patient twice weekly for wrap changes on weeks that patient does not come into [facility] Edema Control: 3 Layer Compression System - Bilateral - may anchor with unna if needed Compression Pump: Use compression pump on left lower extremity for 30 minutes, twice daily. - Please use for 1 hour twice daily Compression Pump: Use compression pump on right lower extremity for 30 minutes, twice daily. - Please use for 1 hour twice daily Home Health: [facility] Nurse may visit PRN to address patient s wound care needs. FACE TO FACE ENCOUNTER: MEDICARE and MEDICAID PATIENTS: I certify that this patient is under my care and that I had a face-to-face encounter that meets the physician face-to-face encounter requirements with this patient on this date. The GRASON, BRAILSFORD ([id number]) encounter with the patient was in whole or in part for the following MEDICAL CONDITION: (primary reason for [place]) MEDICAL NECESSITY: I certify, that based on my findings, NURSING services are a medically necessary home health service. HOME BOUND STATUS: I certify that my clinical findings support that this patient is homebound (i.e., Due to illness  or injury, pt requires aid of supportive devices such as crutches, cane, wheelchairs, walkers, the use of special transportation or the assistance of another person to leave their place of residence. There is a normal inability to leave the home and doing so requires considerable and taxing effort. Other absences are for medical reasons / religious services and are infrequent or of short duration when for other reasons). If current dressing causes regression in wound condition, may D/C ordered dressing product/s and apply Normal Saline Moist Dressing daily until next [place] / Other MD appointment. [employer] of regression in wound condition at [phone]. Please direct any NON-WOUND related issues/requests for orders to patient's Primary Care Physician My suggestion currently is gonna be  that we go ahead and continue with the above wound care measures for the next week. Patient agreement the plan. Mainly we are attempting to manage his lymphedema as best we can until his follow up with the lymphedema clinic. Anything changes he'll let me know. Please see above for specific wound care orders. We will see patient for re-evaluation in 2 week(s) here in the clinic. If anything worsens or changes patient will contact our office for additional recommendations. Electronic Signature(s) Signed: 05/17/2018 8:01:46 PM By: Worthy Keeler PA-C Entered By: Worthy Keeler on 05/14/2018 13:32:07 Adam Merritt ([id number]) -------------------------------------------------------------------------------- ROS/PFSH Details Patient Name: Adam Merritt Date of Service: 05/14/2018 12:45 PM Medical Record Number: [id number] Patient Account Number: [id number] Date of Birth/Sex: Aug 06, 1945 (72 y.o. M) Treating RN: Montey Hora Primary Care Provider: [name] Other Clinician: Referring Provider: [name] Treating Provider/Extender: Melburn Hake, Bynum Mccullars [name]: 5 Information Obtained From Patient Wound History Do you currently have one or more open woundso No Have you tested positive for osteomyelitis (bone infection)o No Have you had any tests for circulation on your legso Yes Constitutional Symptoms (General Health) Complaints and Symptoms: Negative for: Fever; Chills Cardiovascular Complaints and Symptoms: Positive for: LE edema Medical History: Positive for: Congestive Heart Failure; Coronary Artery Disease; Hypertension; Peripheral Venous Disease Negative for: Angina; Arrhythmia; Deep Vein Thrombosis; Hypotension; Myocardial Infarction; Peripheral Arterial Disease; Phlebitis; Vasculitis Past Medical History Notes: varicose veins Eyes Medical History: Negative for: Cataracts; Glaucoma; Optic Neuritis Ear/Nose/Mouth/Throat Medical History: Negative for: Chronic  sinus problems/congestion; Middle ear problems Hematologic/Lymphatic Medical History: Positive for: Anemia - aplastic anemia; Lymphedema Negative for: Hemophilia; Human Immunodeficiency Virus; Sickle Cell Disease Respiratory Complaints and Symptoms: No Complaints or Symptoms Medical History: Positive for: Chronic Obstructive Pulmonary Disease (COPD); Sleep Apnea Negative for: Aspiration; Asthma; Pneumothorax; Tuberculosis Past Medical History Notes: TYQUARIUS, PAGLIA ([id number]) home O2 at night as needed Gastrointestinal Medical History: Negative for: Cirrhosis ; Colitis; Crohnos; Hepatitis A; Hepatitis B; Hepatitis C Endocrine Medical History: Positive for: Type II Diabetes Negative for: Type I Diabetes Time with diabetes: since 2006 Treated with: Insulin Blood sugar tested every day: Yes Tested : Blood sugar testing results: Breakfast: 209 Genitourinary Medical History: Negative for: End Stage Renal Disease Immunological Medical History: Negative for: Lupus Erythematosus; Raynaudos; Scleroderma Integumentary (Skin) Medical History: Negative for: History of Burn; History of pressure wounds Musculoskeletal Medical History: Positive for: Osteoarthritis Negative for: Gout; Rheumatoid Arthritis; Osteomyelitis Neurologic Medical History: Positive for: Neuropathy Negative for: Dementia; Quadriplegia; Paraplegia; Seizure Disorder Past Medical History Notes: CVA - 1992 Oncologic Medical History: Positive for: Received Chemotherapy - last dose 2006 Negative for: Received Radiation Psychiatric Complaints and Symptoms: No Complaints or Symptoms Medical History: KEANTE, URIZAR ([id number]) Negative for: Anorexia/bulimia; Confinement Anxiety  Immunizations Pneumococcal Vaccine: Received Pneumococcal Vaccination: Yes Implantable Devices Hospitalization / Surgery History Name of Hospital Purpose of Hospitalization/Surgery Date [facility] bronchitis 11/03/2013 Family and Social  History Cancer: Yes - Paternal Grandparents; Diabetes: No; Heart Disease: No; Hereditary Spherocytosis: No; Hypertension: No; Kidney Disease: Yes - Siblings; Lung Disease: Yes - Mother; Seizures: No; Stroke: No; Thyroid Problems: No; Tuberculosis: No; Never smoker; Marital Status - Married; Alcohol Use: Never; Drug Use: No History; Caffeine Use: Never; Financial Concerns: No; Food, Clothing or Shelter Needs: No; Support System Lacking: No; Transportation Concerns: No; Advanced Directives: No; Patient does not want information on Advanced Directives Physician Affirmation I have reviewed and agree with the above information. Electronic Signature(s) Signed: 05/14/2018 5:28:58 PM By: Montey Hora Signed: 05/17/2018 8:01:46 PM By: Worthy Keeler PA-C Entered By: Worthy Keeler on 05/14/2018 13:31:07 Adam Merritt ([id number]) -------------------------------------------------------------------------------- SuperBill Details Patient Name: Adam Merritt Date of Service: 05/14/2018 Medical Record Number: [id number] Patient Account Number: [id number] Date of Birth/Sex: [birth year] (72 y.o. M) Treating RN: Montey Hora Primary Care Provider: [name] Other Clinician: Referring Provider: [name] Treating Provider/Extender: Melburn Hake, Amanda Pote [name]: 5 Diagnosis Coding ICD-10 Codes Code Description L03.115 Cellulitis of right lower limb L03.116 Cellulitis of left lower limb I89.0 Lymphedema, not elsewhere classified E66.9 Obesity, unspecified Facility Procedures CPT4: Description Modifier Quantity Code [id number] BILATERAL: Application of multi-layer venous compression system; leg (below 1 knee), including ankle and foot. Physician Procedures CPT4 Code: 2641583 Description: 09407 - WC PHYS LEVEL 4 - EST PT ICD-10 Diagnosis Description L03.115 Cellulitis of right lower limb L03.116 Cellulitis of left lower limb I89.0 Lymphedema, not elsewhere classified E66.9 Obesity,  unspecified Modifier: Quantity: 1 Electronic Signature(s) Signed: 05/14/2018 5:28:58 PM By: Montey Hora Signed: 05/17/2018 8:01:46 PM By: Worthy Keeler PA-C Entered By: Montey Hora on 05/14/2018 13:41:21

## 2018-05-23 ENCOUNTER — Other Ambulatory Visit: Payer: Self-pay

## 2018-05-23 DIAGNOSIS — E1165 Type 2 diabetes mellitus with hyperglycemia: Secondary | ICD-10-CM

## 2018-05-23 MED ORDER — LIRAGLUTIDE 18 MG/3ML ~~LOC~~ SOPN: 1.8000 mg | PEN_INJECTOR | Freq: Every day | SUBCUTANEOUS | 3 refills | Status: DC

## 2018-05-27 ENCOUNTER — Encounter: Payer: Self-pay | Admitting: Nurse Practitioner

## 2018-05-27 ENCOUNTER — Ambulatory Visit (INDEPENDENT_AMBULATORY_CARE_PROVIDER_SITE_OTHER): Payer: Medicare Other | Admitting: Nurse Practitioner

## 2018-05-27 VITALS — BP 150/82 | HR 96 | Resp 16 | Ht 66.0 in | Wt 305.2 lb

## 2018-05-27 DIAGNOSIS — I739 Peripheral vascular disease, unspecified: Secondary | ICD-10-CM

## 2018-05-27 DIAGNOSIS — G894 Chronic pain syndrome: Secondary | ICD-10-CM

## 2018-05-27 DIAGNOSIS — I10 Essential (primary) hypertension: Secondary | ICD-10-CM | POA: Diagnosis not present

## 2018-05-27 DIAGNOSIS — E1142 Type 2 diabetes mellitus with diabetic polyneuropathy: Secondary | ICD-10-CM | POA: Diagnosis not present

## 2018-05-27 DIAGNOSIS — I890 Lymphedema, not elsewhere classified: Secondary | ICD-10-CM | POA: Diagnosis not present

## 2018-05-27 MED ORDER — GABAPENTIN 300 MG PO CAPS: ORAL_CAPSULE | ORAL | 3 refills | Status: DC

## 2018-05-27 MED ORDER — FENTANYL 50 MCG/HR TD PT72: 50.0000 ug | MEDICATED_PATCH | TRANSDERMAL | 0 refills | Status: DC

## 2018-05-27 NOTE — Progress Notes (Signed)
[facility] [place],  [address]  Internal MEDICINE  Office Visit Note  Patient Name: Adam Merritt  [id number]  [id number]  Date of Service: 05/27/2018  Chief Complaint  Patient presents with  . Medical Management of Chronic Issues    4 week follow up  . Pain    pain shooting up and down right leg    He states that he has been having increase pain in lower legs. Doing better with fentanyl patches. Putting tape over them to help keep them secure on his skin. Lessens the pain, but does not take it away. Pain is most severe at night.  He is also seeing wound care for his lower legs. Using lymphedema pump twice daily on his legs .Legs are less red and swollen. Not currently wrapped.       Current Medication: Outpatient Encounter Medications as of 05/27/2018  Medication Sig  . albuterol (PROVENTIL HFA;VENTOLIN HFA) 108 (90 Base) MCG/ACT inhaler INHALE 1 PUFF INTO THE LUNGS EVERY 6 HOURS AS NEEDED FOR WHEEZING OR SHORTNESS OF BREATH (Patient taking differently: Inhale 1 puff into the lungs every 6 (six) hours as needed for wheezing or shortness of breath. )  . aspirin 325 MG EC tablet Take 325 mg by mouth daily.  Marland Kitchen atorvastatin (LIPITOR) 40 MG tablet Take 40 mg by mouth at bedtime.   . carvedilol (COREG) 25 MG tablet Take 25 mg by mouth 2 (two) times daily with a meal.  . colchicine 0.6 MG tablet Take 1 tablet (0.6 mg total) by mouth daily.  Marland Kitchen docusate sodium (COLACE) 50 MG capsule Take 50 mg by mouth 2 (two) times daily.  . febuxostat (ULORIC) 40 MG tablet Take 1 tablet (40 mg total) by mouth daily.  . fentaNYL (DURAGESIC - DOSED MCG/HR) 50 MCG/HR Place 1 patch (50 mcg total) onto the skin every 3 (three) days.  . furosemide (LASIX) 40 MG tablet Take 1 tablet (40 mg total) by mouth daily.  Marland Kitchen gabapentin (NEURONTIN) 300 MG capsule Take 1 capsule po in AM and 1 capsule po in pm. Take 2 capsules po at bedtime.  . hydrALAZINE (APRESOLINE) 25 MG tablet TAKE  1 TABLET BY MOUTH THREE TIMES DAILY FOR BLOOD PRESSURE (Patient taking differently: Take 25 mg by mouth 3 (three) times daily. )  . ibuprofen (ADVIL,MOTRIN) 600 MG tablet Take 600 mg by mouth 2 (two) times daily as needed for mild pain or moderate pain.   . indapamide (LOZOL) 2.5 MG tablet Take 1 tablet (2.5 mg total) by mouth daily.  . Insulin Glargine (BASAGLAR KWIKPEN) 100 UNIT/ML SOPN INJECT 30 TO 36 UNITS UNDER THE SKIN EVERY DAY (Patient taking differently: Inject 30-36 Units into the skin daily. )  . ipratropium-albuterol (DUONEB) 0.5-2.5 (3) MG/3ML SOLN Take 3 mLs by nebulization every 4 (four) hours as needed (for shortness of breath).  Elmore Guise Devices (ONE TOUCH DELICA LANCING DEV) MISC Use as directed  . liraglutide (VICTOZA) 18 MG/3ML SOPN Inject 0.3 mLs (1.8 mg total) into the skin daily.  . metolazone (ZAROXOLYN) 2.5 MG tablet Take 1 tablet (2.5 mg total) by mouth daily.  . Multiple Vitamin (MULTIVITAMIN WITH MINERALS) TABS tablet Take 1 tablet by mouth daily.  Marland Kitchen omeprazole (PRILOSEC) 20 MG capsule Take 20 mg by mouth daily.  [name] 46K MISC Use twice daily diag e11.65  . Oxycodone HCl 10 MG TABS Half to one tab as needed prn for pain (Patient taking differently: Half to one  tab  prn for pain)  . pentoxifylline (TRENTAL) 400 MG CR tablet TAKE 1 TABLET BY MOUTH THREE TIMES DAILY FOR CLAUDICATION (Patient taking differently: Take 400 mg by mouth 3 (three) times daily. )  . potassium chloride SA (K-DUR,KLOR-CON) 20 MEQ tablet Take 2 tablets (40 mEq total) by mouth 2 (two) times daily.  . sennosides-docusate sodium (SENOKOT-S) 8.6-50 MG tablet Take 1-2 tablets by mouth 2 (two) times daily.  Marland Kitchen UREA 20 INTENSIVE HYDRATING 20 % cream USE AS DIRECTED 3 TIMES A WEEK  . zolpidem (AMBIEN CR) 12.5 MG CR tablet Take 1 tablet (12.5 mg total) by mouth at bedtime as needed for sleep.  . [DISCONTINUED] fentaNYL (DURAGESIC - DOSED MCG/HR) 50 MCG/HR Place 1 patch (50 mcg total) onto  the skin every 3 (three) days.  . [DISCONTINUED] gabapentin (NEURONTIN) 300 MG capsule Take 1 capsule (300 mg total) by mouth 2 (two) times daily.   No facility-administered encounter medications on file as of 05/27/2018.     Surgical History: Past Surgical History:  Procedure Laterality Date  . CHOLECYSTECTOMY    . CORONARY ARTERY BYPASS GRAFT      Medical History: Past Medical History:  Diagnosis Date  . Anemia   . CAD (coronary artery disease)   . Chest pain   . Coronary artery disease   . Diabetes mellitus without complication ([place])   . Diastolic CHF ([place])   . Esophageal reflux   . Herpes zoster without mention of complication   . Hyperlipidemia   . Hypertension   . Insomnia   . Lumbago   . Lymphedema   . Neuropathy in diabetes ([place])   . Obstructive chronic bronchitis without exacerbation ([place])   . Other malaise and fatigue   . Stroke ([place])   . Varicose veins     Family History: Family History  Problem Relation Age of Onset  . Diabetes Mother   . Hyperlipidemia Mother   . Hypertension Mother   . Diabetes Father   . Hyperlipidemia Father   . Hypertension Father   . CAD Father     Social History   Socioeconomic History  . Marital status: Married    Spouse name: Not on file  . Number of children: Not on file  . Years of education: Not on file  . Highest education level: Not on file  Occupational History  . Not on file  Social Needs  . Financial resource strain: Not on file  . Food insecurity:    Worry: Not on file    Inability: Not on file  . Transportation needs:    Medical: Not on file    Non-medical: Not on file  Tobacco Use  . Smoking status: Never Smoker  . Smokeless tobacco: Never Used  Substance and Sexual Activity  . Alcohol use: No    Comment: quit alcohol 30 years ago  . Drug use: No  . Sexual activity: Not on file  Lifestyle  . Physical activity:    Days per week: Not on file    Minutes per session: Not on file  . Stress: Not  on file  Relationships  . Social connections:    Talks on phone: Not on file    Gets together: Not on file    Attends religious service: Not on file    Active member of club or organization: Not on file    Attends meetings of clubs or organizations: Not on file    Relationship status: Not on file  .  Intimate partner violence:    Fear of current or ex partner: Not on file    Emotionally abused: Not on file    Physically abused: Not on file    Forced sexual activity: Not on file  Other Topics Concern  . Not on file  Social History Narrative   Lives at home with wife, uses a wheelchair now      Review of Systems  Constitutional: Positive for fatigue. Negative for activity change.  HENT: Negative for congestion and postnasal drip.   Eyes: Negative.   Respiratory: Positive for shortness of breath. Negative for cough, chest tightness and wheezing.   Cardiovascular: Positive for leg swelling. Negative for chest pain and palpitations.       Elevated blood pressure today.   Gastrointestinal: Negative for constipation, diarrhea, nausea and vomiting.  Endocrine: Negative for cold intolerance, heat intolerance, polydipsia and polyuria.       Blood sugars doing well   Musculoskeletal: Positive for arthralgias, back pain and myalgias.       Neuropathy in his feet worse at night   Skin: Positive for rash and wound.       Skin of both lower legs looking much better. Swelling is improved. No longer draining fluid. Seeing wound care center tomorrow.   Allergic/Immunologic: Positive for environmental allergies.  Neurological: Negative for dizziness, light-headedness and headaches.  Hematological: Negative for adenopathy.  Psychiatric/Behavioral: Positive for dysphoric mood. Negative for agitation. The patient is nervous/anxious.    Today's Vitals   05/27/18 1348  BP: (!) 150/82  Pulse: 96  Resp: 16  SpO2: 96%  Weight: (!) 305 lb 3.2 oz (138.4 kg)  Height: 5\' 6"  (1.676 m)    Physical  Exam Vitals signs and nursing note reviewed.  Constitutional:      General: He is not in acute distress.    Appearance: Normal appearance. He is well-developed. He is obese. He is not ill-appearing or diaphoretic.  HENT:     Head: Normocephalic and atraumatic.     Nose: Nose normal.     Mouth/Throat:     Pharynx: No oropharyngeal exudate.  Eyes:     Conjunctiva/sclera: Conjunctivae normal.     Pupils: Pupils are equal, round, and reactive to light.  Neck:     Musculoskeletal: Normal range of motion and neck supple.     Thyroid: No thyromegaly.     Vascular: No JVD.     Trachea: No tracheal deviation.  Cardiovascular:     Rate and Rhythm: Normal rate and regular rhythm.     Heart sounds: Normal heart sounds. No murmur. No friction rub. No gallop.      Comments: Moderate swelling in bilateral lower legs.  Pulmonary:     Effort: Pulmonary effort is normal. No respiratory distress.     Breath sounds: Normal breath sounds. No wheezing or rales.  Chest:     Chest wall: No tenderness.  Abdominal:     General: Bowel sounds are normal.     Palpations: Abdomen is soft.     Tenderness: There is no abdominal tenderness.  Musculoskeletal: Normal range of motion.     Comments: Moderate to severe lower leg pain, bilaterally. Worse on the right side, especially after he "twisted it" this morning in the bathroom. Pain is severe from just above the heel and into the calf. Hurts to flex and dorsiflex the right foot. Strength is diminished. Having very difficult time putting any weight on the right foot.   Lymphadenopathy:  Cervical: No cervical adenopathy.  Skin:    General: Skin is warm and dry.     Comments: The patient's legs improved. Seeing wound care for continued management.   Neurological:     General: No focal deficit present.     Mental Status: He is alert and oriented to person, place, and time.     Cranial Nerves: No cranial nerve deficit.  Psychiatric:        Behavior:  Behavior normal.        Thought Content: Thought content normal.        Judgment: Judgment normal.    Assessment/Plan: 1. Diabetic polyneuropathy associated with type 2 diabetes mellitus (HCC) Increased dosing of gabapentin 300mg  to 1 capsule in AM and in PM. Take two capsules at bedtime. Continue diabetic medications as prescribed. HgbA1c due 07/2018 - gabapentin (NEURONTIN) 300 MG capsule; Take 1 capsule po in AM and 1 capsule po in pm. Take 2 capsules po at bedtime.  Dispense: 180 capsule; Refill: 3  2. Chronic pain disorder Continue fentanyl 49mcg patches, placing new patch every three days. New prescription sent to his pharmacy.  - fentaNYL (DURAGESIC - DOSED MCG/HR) 50 MCG/HR; Place 1 patch (50 mcg total) onto the skin every 3 (three) days.  Dispense: 10 patch; Refill: 0  3. Lymphedema of both lower extremities Continue to follow up with wound care clinic. Next appointment 05/29/2019. Lymphedema pump should be used as prescribed   4. Essential hypertension Generally stable. Continue bp medication as prescribed   5. Peripheral vascular disease -  Bilateral lower extremities, likely contributing to pain and chronic wounds in the legs. Continue with regular visits with wound care clinic.   General Counseling: Ca[place]mon verbalizes understanding of the findings of todays visit and agrees with plan of treatment. I have discussed any further diagnostic evaluation that may be needed or ordered today. We also reviewed his medications today. he has been encouraged to call the office with any questions or concerns that should arise related to todays visit.  Reviewed risks and possible side effects associated with taking opiates, benzodiazepines and other CNS depressants. Combination of these could cause dizziness and drowsiness. Advised patient not to drive or operate machinery when taking these medications, as patient's and other's life can be at risk and will have consequences. Patient verbalized  understanding in this matter. Dependence and abuse for these drugs will be monitored closely. A Controlled substance policy and procedure is on file which allows [facility] to order a urine drug screen test at any visit. Patient understands and agrees with the plan  This patient was seen by [name] FNP Collaboration with Dr [name] as a part of collaborative care agreement  Meds ordered this encounter  Medications  . fentaNYL (DURAGESIC - DOSED MCG/HR) 50 MCG/HR    Sig: Place 1 patch (50 mcg total) onto the skin every 3 (three) days.    Dispense:  10 patch    Refill:  0    Order Specific Question:   Supervising Provider    Answer:   [name] [[id number]]  . gabapentin (NEURONTIN) 300 MG capsule    Sig: Take 1 capsule po in AM and 1 capsule po in pm. Take 2 capsules po at bedtime.    Dispense:  180 capsule    Refill:  3    Increased dose. Patient will use up what he has first. Does not need new prescription yet.    Order Specific Question:  Supervising Provider    Answer:   [name] [[id number]]    Time spent: 25 Minutes      Dr [name] Internal medicine

## 2018-05-28 ENCOUNTER — Encounter: Payer: Medicare Other | Admitting: Physician Assistant

## 2018-05-28 DIAGNOSIS — I890 Lymphedema, not elsewhere classified: Secondary | ICD-10-CM | POA: Diagnosis not present

## 2018-05-29 NOTE — Progress Notes (Signed)
Adam Merritt, Adam Merritt ([id number]) Visit Report for 05/28/2018 Arrival Information Details Patient Name: Adam Merritt Date of Service: 05/28/2018 11:15 AM Medical Record Number: [id number] Patient Account Number: [id number] Date of Birth/Sex: 1946/04/21 (72 y.o. M) Treating RN: Montey Hora Primary Care Micah Barnier: [name] Other Clinician: Referring Karlisa Gaubert: [name] Treating Rhegan Trunnell/Extender: Melburn Hake, [name]: 7 Visit Information History Since Last Visit Added or deleted any medications: No Patient Arrived: Ambulatory Any new allergies or adverse reactions: No Arrival Time: 11:53 Had a fall or experienced change in No Accompanied By: wife activities of daily living that may affect Transfer Assistance: None risk of falls: Patient Identification Verified: Yes Signs or symptoms of abuse/neglect since last visito No Secondary Verification Process Yes Hospitalized since last visit: No Completed: Implantable device outside of the clinic excluding No Patient Has Alerts: Yes cellular tissue based products placed in the center Patient Alerts: Patient on Blood since last visit: Thinner Has Dressing in Place as Prescribed: No Aspirin 325mg  Pain Present Now: No Type II Diabetes ABI 5/19 L 1.1 R 0.9 TBI L 0.6 R 0.6 Electronic Signature(s) Signed: 05/28/2018 12:09:42 PM By: Gretta Cool, BSN, RN, CWS, Kim RN, BSN Previous Signature: 05/28/2018 12:09:36 PM Version By: Gretta Cool, BSN, RN, CWS, Kim RN, BSN Previous Signature: 05/28/2018 12:06:56 PM Version By: Becky Sax, Sallie RCP, RRT, CHT Entered By: Gretta Cool, BSN, RN, CWS, Kim on 05/28/2018 12:09:41 Adam Merritt ([id number]) -------------------------------------------------------------------------------- Encounter Discharge Information Details Patient Name: Adam Merritt Date of Service: 05/28/2018 11:15 AM Medical Record Number: [id number] Patient Account Number: [id number] Date of Birth/Sex: 24-Mar-1946 (72  y.o. M) Treating RN: Cornell Barman Primary Care Jayquan Bradsher: [name] Other Clinician: Referring Lynda Capistran: [name] Treating Saahil Herbster/Extender: Melburn Hake, [name]: 7 Encounter Discharge Information Items Post Procedure Vitals Discharge Condition: Stable Temperature (F): 97.8 Ambulatory Status: Cane Pulse (bpm): 79 Discharge Destination: Home Respiratory Rate (breaths/min): 16 Transportation: Private Auto Blood Pressure (mmHg): 169/87 Accompanied By: self Schedule Follow-up Appointment: Yes Clinical Summary of Care: Electronic Signature(s) Signed: 05/28/2018 1:27:23 PM By: Gretta Cool, BSN, RN, CWS, Kim RN, BSN Entered By: Gretta Cool, BSN, RN, CWS, Kim on 05/28/2018 12:43:09 Adam Merritt ([id number]) -------------------------------------------------------------------------------- Lower Extremity Assessment Details Patient Name: Adam Merritt Date of Service: 05/28/2018 11:15 AM Medical Record Number: [id number] Patient Account Number: [id number] Date of Birth/Sex: [birth year] (72 y.o. M) Treating RN: Montey Hora Primary Care Galen Russman: [name] Other Clinician: Referring Matraca Hunkins: [name] Treating Salah Burlison/Extender: Melburn Hake, [name]: 7 Edema Assessment Assessed: [Left: No] [Right: No] [Left: Edema] [Right: :] Calf Left: Right: Point of Measurement: 35 cm From Medial Instep 57.5 cm 52.5 cm Ankle Left: Right: Point of Measurement: 11 cm From Medial Instep 38 cm 32 cm Vascular Assessment Pulses: Dorsalis Pedis Palpable: [Left:Yes] [Right:Yes] Posterior Tibial Extremity colors, hair growth, and conditions: Extremity Color: [Left:Hyperpigmented] [Right:Hyperpigmented] Hair Growth on Extremity: [Left:No] [Right:No] Temperature of Extremity: [Right:Warm] Capillary Refill: [Left:< 3 seconds] [Right:< 3 seconds] Toe Nail Assessment Left: Right: Thick: Yes Yes Discolored: Yes Yes Deformed: Yes Yes Improper Length and Hygiene: Yes  Yes Electronic Signature(s) Signed: 05/28/2018 12:10:20 PM By: Gretta Cool, BSN, RN, CWS, Kim RN, BSN Signed: 05/28/2018 1:47:23 PM By: Montey Hora Entered By: Gretta Cool, BSN, RN, CWS, Kim on 05/28/2018 12:10:20 Adam Merritt ([id number]) -------------------------------------------------------------------------------- Multi Wound Chart Details Patient Name: Adam Merritt Date of Service: 05/28/2018 11:15 AM Medical Record Number: [id number] Patient Account Number: [id number] Date of Birth/Sex: [birth year] (72 y.o. M) Treating RN: Cornell Barman Primary Care Kashina Mecum:  [name] Other Clinician: Referring Jahara Dail: [name] Treating Vernel Langenderfer/Extender: STONE III, [name]: 7 Vital Signs Height(in): 66 Pulse(bpm): 62 Weight(lbs): 323 Blood Pressure(mmHg): 169/87 Body Mass Index(BMI): 52 Temperature(F): 97.8 Respiratory Rate 18 (breaths/min): Photos: [14:No Photos] [15:No Photos] [N/A:N/A] Wound Location: [14:Right Toe Great] [15:Left Lower Leg] [N/A:N/A] Wounding Event: [14:Trauma] [15:Trauma] [N/A:N/A] Primary Etiology: [14:Trauma, Other] [15:Trauma, Other] [N/A:N/A] Comorbid History: [14:Anemia, Lymphedema, Chronic Obstructive Pulmonary Disease (COPD), Sleep Apnea, Congestive Heart Failure, Coronary Artery Disease, Hypertension, Peripheral Venous Disease, Type II Diabetes, Osteoarthritis, Neuropathy, Received  Chemotherapy] [15:Anemia, Lymphedema, Chronic Obstructive Pulmonary Disease (COPD), Sleep Apnea, Congestive Heart Failure, Coronary Artery Disease, Hypertension, Peripheral Venous Disease, Type II Diabetes, Osteoarthritis, Neuropathy, Received  Chemotherapy] [N/A:N/A] Date Acquired: [14:05/22/2018] [15:05/22/2018] [N/A:N/A] Weeks of Treatment: [14:0] [15:0] [N/A:N/A] Wound Status: [14:Open] [15:Open] [N/A:N/A] Measurements L x W x D [14:1.3x1.4x0.1] [15:1.2x0.9x0.1] [N/A:N/A] (cm) Area (cm) : [14:1.429] [15:0.848] [N/A:N/A] Volume (cm) : [14:0.143]  [15:0.085] [N/A:N/A] Starting Position 1 [14:3] (o'clock): Ending Position 1 [14:9] (o'clock): Maximum Distance 1 (cm): [14:0.5] Undermining: [14:Yes] [15:No] [N/A:N/A] Classification: [14:Full Thickness Without Exposed Support Structures] [15:Full Thickness Without Exposed Support Structures] [N/A:N/A] Exudate Amount: [14:None Present] [15:Medium] [N/A:N/A] Exudate Type: [14:N/A] [15:Serous] [N/A:N/A] Exudate Color: [14:N/A] [15:amber] [N/A:N/A] Wound Margin: [14:Flat and Intact] [15:Flat and Intact] [N/A:N/A] Granulation Amount: [14:Medium (34-66%)] [15:Large (67-100%)] [N/A:N/A] Granulation Quality: [14:Pink] [15:Red, Hyper-granulation] [N/A:N/A] Necrotic Amount: [14:Small (1-33%)] [15:None Present (0%)] [N/A:N/A] Exposed Structures: [N/A:N/A] Fat Layer (Subcutaneous Fat Layer (Subcutaneous Tissue) Exposed: Yes Tissue) Exposed: Yes Fascia: No Fascia: No Tendon: No Tendon: No Muscle: No Muscle: No Joint: No Joint: No Bone: No Bone: No Epithelialization: None None N/A Periwound Skin Texture: Excoriation: Yes No Abnormalities Noted N/A Induration: No Callus: No Crepitus: No Rash: No Scarring: No Periwound Skin Moisture: Maceration: No No Abnormalities Noted N/A Dry/Scaly: No Periwound Skin Color: Atrophie Blanche: No No Abnormalities Noted N/A Cyanosis: No Ecchymosis: No Erythema: No Hemosiderin Staining: No Mottled: No Pallor: No Rubor: No Temperature: No Abnormality N/A N/A Tenderness on Palpation: Yes No N/A Wound Preparation: Ulcer Cleansing: Ulcer Cleansing: N/A Rinsed/Irrigated with Saline Rinsed/Irrigated with Saline Topical Anesthetic Applied: Topical Anesthetic Applied: Other: lidocaine 4% None Treatment Notes Electronic Signature(s) Signed: 05/28/2018 12:10:43 PM By: Gretta Cool, BSN, RN, CWS, Kim RN, BSN Entered By: Gretta Cool, BSN, RN, CWS, Kim on 05/28/2018 12:10:43 Adam Merritt  ([id number]) -------------------------------------------------------------------------------- [place] Details Patient Name: Adam Merritt, Adam Merritt Date of Service: 05/28/2018 11:15 AM Medical Record Number: [id number] Patient Account Number: [id number] Date of Birth/Sex: [birth year] (72 y.o. M) Treating RN: Cornell Barman Primary Care Pranshu Lyster: [name] Other Clinician: Referring Kyri Shader: [name] Treating Emanuelle Bastos/Extender: Melburn Hake, [name]: 7 Active Inactive Abuse / Safety / Falls / Self Care Management Nursing Diagnoses: Potential for falls Goals: Patient will remain injury free related to falls Date Initiated: 04/09/2018 Target Resolution Date: 07/13/2018 Goal Status: Active Interventions: Assess fall risk on admission and as needed Notes: Orientation to the Wound Care Program Nursing Diagnoses: Knowledge deficit related to the wound healing center program Goals: Patient/caregiver will verbalize understanding of the [place] Program Date Initiated: 04/09/2018 Target Resolution Date: 07/13/2018 Goal Status: Active Interventions: Provide education on orientation to the wound center Notes: Venous Leg Ulcer Nursing Diagnoses: Actual venous Insuffiency (use after diagnosis is confirmed) Goals: Patient will maintain optimal edema control Date Initiated: 04/09/2018 Target Resolution Date: 07/13/2018 Goal Status: Active Interventions: Assess peripheral edema status every visit. Adam Merritt, Adam Merritt ([id number]) Compression as ordered Provide education on venous insufficiency Notes: Electronic Signature(s) Signed:  05/28/2018 12:10:25 PM By: Gretta Cool, BSN, RN, CWS, Kim RN, BSN Entered By: Gretta Cool, BSN, RN, CWS, Kim on 05/28/2018 12:10:24 Adam Merritt ([id number]) -------------------------------------------------------------------------------- Pain Assessment Details Patient Name: Adam Merritt Date of Service: 05/28/2018 11:15 AM Medical  Record Number: [id number] Patient Account Number: [id number] Date of Birth/Sex: 1945-12-31 (72 y.o. M) Treating RN: Montey Hora Primary Care Romilda Proby: [name] Other Clinician: Referring Tresea Heine: [name] Treating Kaelon Weekes/Extender: Melburn Hake, [name]: 7 Active Problems Location of Pain Severity and Description of Pain Patient Has Paino No Site Locations Pain Management and Medication Current Pain Management: Electronic Signature(s) Signed: 05/28/2018 12:09:55 PM By: Gretta Cool, BSN, RN, CWS, Kim RN, BSN Signed: 05/28/2018 1:47:23 PM By: Montey Hora Previous Signature: 05/28/2018 12:09:49 PM Version By: Gretta Cool BSN, RN, CWS, Kim RN, BSN Previous Signature: 05/28/2018 12:06:56 PM Version By: Lorine Bears RCP, RRT, CHT Entered By: Gretta Cool, BSN, RN, CWS, Kim on 05/28/2018 12:09:55 Adam Merritt ([id number]) -------------------------------------------------------------------------------- Patient/Caregiver Education Details Patient Name: Adam Merritt Date of Service: 05/28/2018 11:15 AM Medical Record Number: [id number] Patient Account Number: [id number] Date of Birth/Gender: 06/11/1945 (72 y.o. M) Treating RN: Cornell Barman Primary Care Physician: [name] Other Clinician: Referring Physician: [name] Treating Physician/Extender: Sharalyn Ink in Treatment: 7 Education Assessment Education Provided To: Patient Education Topics Provided Wound/Skin Impairment: Handouts: Caring for Your Ulcer Methods: Demonstration, Explain/Verbal Responses: State content correctly Electronic Signature(s) Signed: 05/28/2018 1:27:23 PM By: Gretta Cool, BSN, RN, CWS, Kim RN, BSN Entered By: Gretta Cool, BSN, RN, CWS, Kim on 05/28/2018 12:43:23 Adam Merritt ([id number]) -------------------------------------------------------------------------------- Wound Assessment Details Patient Name: Adam Merritt Date of Service: 05/28/2018 11:15 AM Medical Record  Number: [id number] Patient Account Number: [id number] Date of Birth/Sex: 08-25-1945 (72 y.o. M) Treating RN: Montey Hora Primary Care Stephenia Vogan: [name] Other Clinician: Referring Antwan Bribiesca: [name] Treating Berdell Hostetler/Extender: STONE III, [name]: 7 Wound Status Wound Number: 14 Primary Trauma, Other Etiology: Wound Location: Right Toe Great Wound Open Wounding Event: Trauma Status: Date Acquired: 05/22/2018 Comorbid Anemia, Lymphedema, Chronic Obstructive Weeks Of Treatment: 0 History: Pulmonary Disease (COPD), Sleep Apnea, Clustered Wound: No Congestive Heart Failure, Coronary Artery Disease, Hypertension, Peripheral Venous Disease, Type II Diabetes, Osteoarthritis, Neuropathy, Received Chemotherapy Photos Photo Uploaded By: Gretta Cool, BSN, RN, CWS, Kim on 05/28/2018 13:10:23 Wound Measurements Length: (cm) 1.3 Width: (cm) 1.4 Depth: (cm) 0.1 Area: (cm) 1.429 Volume: (cm) 0.143 % Reduction in Area: % Reduction in Volume: Epithelialization: None Tunneling: No Undermining: Yes Starting Position (o'clock): 3 Ending Position (o'clock): 9 Maximum Distance: (cm) 0.5 Wound Description Full Thickness Without Exposed Support Foul Od Classification: Structures Slough/ Wound Margin: Flat and Intact Exudate None Present Amount: or After Cleansing: No Fibrino No Wound Bed Granulation Amount: Medium (34-66%) Exposed Structure Granulation Quality: Pink Fascia Exposed: No Adam Merritt, Adam Merritt ([id number]) Necrotic Amount: Small (1-33%) Fat Layer (Subcutaneous Tissue) Exposed: Yes Necrotic Quality: Adherent Slough Tendon Exposed: No Muscle Exposed: No Joint Exposed: No Bone Exposed: No Periwound Skin Texture Texture Color No Abnormalities Noted: No No Abnormalities Noted: No Callus: No Atrophie Blanche: No Crepitus: No Cyanosis: No Excoriation: Yes Ecchymosis: No Induration: No Erythema: No Rash: No Hemosiderin Staining: No Scarring:  No Mottled: No Pallor: No Moisture Rubor: No No Abnormalities Noted: No Dry / Scaly: No Temperature / Pain Maceration: No Temperature: No Abnormality Tenderness on Palpation: Yes Wound Preparation Ulcer Cleansing: Rinsed/Irrigated with Saline Topical Anesthetic Applied: Other: lidocaine 4%, Treatment Notes Wound #14 (Right Toe Great) Notes Right great toe-prisma; Left lateral  leg-silvercell Electronic Signature(s) Signed: 05/28/2018 12:06:56 PM By: Paulla Fore, RRT, CHT Signed: 05/28/2018 1:47:23 PM By: Montey Hora Entered By: Lorine Bears on 05/28/2018 12:01:18 Adam Merritt ([id number]) -------------------------------------------------------------------------------- Wound Assessment Details Patient Name: Adam Merritt Date of Service: 05/28/2018 11:15 AM Medical Record Number: [id number] Patient Account Number: [id number] Date of Birth/Sex: 06/02/1946 (72 y.o. M) Treating RN: Montey Hora Primary Care Markita Stcharles: [name] Other Clinician: Referring Adalynd Donahoe: [name] Treating Desirai Traxler/Extender: STONE III, [name]: 7 Wound Status Wound Number: 15 Primary Trauma, Other Etiology: Wound Location: Left Lower Leg Wound Open Wounding Event: Trauma Status: Date Acquired: 05/22/2018 Comorbid Anemia, Lymphedema, Chronic Obstructive Weeks Of Treatment: 0 History: Pulmonary Disease (COPD), Sleep Apnea, Clustered Wound: No Congestive Heart Failure, Coronary Artery Disease, Hypertension, Peripheral Venous Disease, Type II Diabetes, Osteoarthritis, Neuropathy, Received Chemotherapy Photos Photo Uploaded By: Gretta Cool, BSN, RN, CWS, Kim on 05/28/2018 13:10:24 Wound Measurements Length: (cm) 1.2 Width: (cm) 0.9 Depth: (cm) 0.1 Area: (cm) 0.848 Volume: (cm) 0.085 % Reduction in Area: % Reduction in Volume: Epithelialization: None Tunneling: No Undermining: No Wound Description Full Thickness Without Exposed  Support Foul O Classification: Structures Slough Wound Margin: Flat and Intact Exudate Medium Amount: Exudate Type: Serous Exudate Color: amber dor After Cleansing: No /Fibrino No Wound Bed Granulation Amount: Large (67-100%) Exposed Structure Granulation Quality: Red, Hyper-granulation Fascia Exposed: No Necrotic Amount: None Present (0%) Fat Layer (Subcutaneous Tissue) Exposed: Yes Tendon Exposed: No Adam Merritt, Adam Merritt ([id number]) Muscle Exposed: No Joint Exposed: No Bone Exposed: No Periwound Skin Texture Texture Color No Abnormalities Noted: No No Abnormalities Noted: No Moisture No Abnormalities Noted: No Wound Preparation Ulcer Cleansing: Rinsed/Irrigated with Saline Topical Anesthetic Applied: None Treatment Notes Wound #15 (Left Lower Leg) Notes Right great toe-prisma; Left lateral leg-silvercell Electronic Signature(s) Signed: 05/28/2018 12:06:56 PM By: Paulla Fore, RRT, CHT Signed: 05/28/2018 1:47:23 PM By: Montey Hora Entered By: Lorine Bears on 05/28/2018 12:03:59 Adam Merritt ([id number]) -------------------------------------------------------------------------------- Vitals Details Patient Name: Adam Merritt Date of Service: 05/28/2018 11:15 AM Medical Record Number: [id number] Patient Account Number: [id number] Date of Birth/Sex: Dec 22, 1945 (72 y.o. M) Treating RN: Montey Hora Primary Care Zeva Leber: [name] Other Clinician: Referring Tarquin Welcher: [name] Treating Keenan Dimitrov/Extender: Melburn Hake, [name]: 7 Vital Signs Time Taken: 11:55 Temperature (F): 97.8 Height (in): 66 Pulse (bpm): 79 Weight (lbs): 323 Respiratory Rate (breaths/min): 18 Body Mass Index (BMI): 52.1 Blood Pressure (mmHg): 169/87 Reference Range: 80 - 120 mg / dl Electronic Signature(s) Signed: 05/28/2018 12:10:13 PM By: Gretta Cool, BSN, RN, CWS, Kim RN, BSN Previous Signature: 05/28/2018 12:10:01 PM Version By:  Gretta Cool, BSN, RN, CWS, Kim RN, BSN Previous Signature: 05/28/2018 12:06:56 PM Version By: Lorine Bears RCP, RRT, CHT Entered By: Gretta Cool, BSN, RN, CWS, Kim on 05/28/2018 12:10:13

## 2018-05-29 NOTE — Progress Notes (Signed)
Adam Merritt ([id number]) Visit Report for 05/28/2018 Chief Complaint Document Details Patient Name: Adam Merritt, Adam Merritt Date of Service: 05/28/2018 11:15 AM Medical Record Number: [id number] Patient Account Number: [id number] Date of Birth/Sex: November 08, 1945 (72 y.o. M) Treating RN: Montey Hora Primary Care Provider: [name] Other Clinician: Referring Provider: [name] Treating Provider/Extender: Melburn Hake, Chriss Mannan [name]: 7 Information Obtained from: Patient Chief Complaint he is here for evaluation of bilateral lower extremity lymphedema right right toe and left LE ulcers Electronic Signature(s) Signed: 05/28/2018 1:28:21 PM By: Worthy Keeler PA-C Entered By: Worthy Keeler on 05/28/2018 12:15:22 Adam Merritt ([id number]) -------------------------------------------------------------------------------- Debridement Details Patient Name: Adam Merritt Date of Service: 05/28/2018 11:15 AM Medical Record Number: [id number] Patient Account Number: [id number] Date of Birth/Sex: December 28, 1945 (72 y.o. M) Treating RN: Montey Hora Primary Care Provider: [name] Other Clinician: Referring Provider: [name] Treating Provider/Extender: Melburn Hake, Reilly Blades [name]: 7 Debridement Performed for Wound #14 Right Toe Great Assessment: Performed By: Physician STONE III, Adriona Kaney E., PA-C Debridement Type: Debridement Level of Consciousness (Pre- Awake and Alert procedure): Pre-procedure Verification/Time Yes - 12:29 Out Taken: Start Time: 12:29 Pain Control: Lidocaine 4% Topical Solution Total Area Debrided (L x W): 1.3 (cm) x 1.4 (cm) = 1.82 (cm) Tissue and other material Viable, Non-Viable, Slough, Subcutaneous, Slough debrided: Level: Skin/Subcutaneous Tissue Debridement Description: Excisional Instrument: Curette Bleeding: Minimum Hemostasis Achieved: Pressure End Time: 12:33 Procedural Pain: 0 Post Procedural Pain: 0 Response to Treatment:  Procedure was tolerated well Level of Consciousness Awake and Alert (Post-procedure): Post Debridement Measurements of Total Wound Length: (cm) 1.3 Width: (cm) 1.4 Depth: (cm) 0.2 Volume: (cm) 0.286 Character of Wound/Ulcer Post Debridement: Improved Post Procedure Diagnosis Same as Pre-procedure Electronic Signature(s) Signed: 05/28/2018 1:28:21 PM By: Worthy Keeler PA-C Signed: 05/28/2018 1:47:23 PM By: Montey Hora Entered By: Montey Hora on 05/28/2018 12:33:27 Adam Merritt ([id number]) -------------------------------------------------------------------------------- HPI Details Patient Name: Adam Merritt Date of Service: 05/28/2018 11:15 AM Medical Record Number: [id number] Patient Account Number: [id number] Date of Birth/Sex: [birth year] (72 y.o. M) Treating RN: Montey Hora Primary Care Provider: [name] Other Clinician: Referring Provider: [name] Treating Provider/Extender: Melburn Hake, Travor Royce [name]: 7 History of Present Illness HPI Description: 10/18/17-He is here for initial evaluation of bilateral lower extremity ulcerations in the presence of venous insufficiency and lymphedema. He has been seen by vascular medicine in the past, Dr. Lucky Cowboy, last seen in 2016. He does have a history of abnormal ABIs, which is to be expected given his lymphedema and venous insufficiency. According to Epic, it appears that all attempts for arterial evaluation and/or angiography were not follow through with by patient. He does have a history of being seen in lymphedema clinic in 2018, stopped going approximately 6 months ago stating "it didn't do any good". He does not have lymphedema pumps, he does not have custom fit compression wrap/stockings. He is diabetic and his recent A1c last month was 7.6. He admits to chronic bilateral lower extremity pain, no change in pain since blister and ulceration development. He is currently being treated with Levaquin for  bronchitis. He has home health and we will continue. 10/25/17-He is here in follow-up evaluation for bilateral lower extremity ulcerationssubtle he remains on Levaquin for bronchitis. Right lower extremity with no evidence of drainage or ulceration, persistent left lower extremity ulceration. He states that home health has not been out since his appointment. He went to [facility] on Tuesday, studies revealed: RIGHT  ABI 0.9, TBI 0.6 LEFT ABI 1.1, TBI 0.6 with triphasic flow bilaterally. We will continue his same treatment plan. He has been educated on compression therapy and need for elevation. He will benefit from lymphedema pumps 11/01/17-He is here in follow-up evaluation for left lower extremity ulcer. The right lower extremity remains healed. He has home health services, but they have not been out to see the patient for 2-3 weeks. He states it home health physical therapy changed his dressing yesterday after therapy; he placed Ace wrap compression. We are still waiting for lymphedema pumps, reordered d/t need for company change. 11/08/17-He is here in follow-up evaluation for left lower extremity ulcer. It is improved. Edema is significantly improved with compression therapy. We will continue with same treatment plan and he will follow-up next week. No word regarding lymphedema pumps 11/15/17-He is here in follow-up evaluation for left lower extremity ulcer. He is healed and will be discharged from wound care services. I have reached out to [facility] regarding his lymphedema pumps. They have been unable to reach the patient; the contact number they had with the patient's wife's cell phone and she has not answered any unrecognized calls. Contact should be made today, trial planned for next week; [facility] will continue to follow 11/27/17 on evaluation today patient has multiple blistered areas over the right lower extremity his left lower extremity appears to be doing  okay. These blistered areas show signs of no infection which is great news. With that being said he did have some necrotic skin overlying which was mechanically debrided away with saline and gauze today without complication. Overall post debridement the wounds appear to be doing better but in general his swelling seems to be increased. This is obviously not good news. I think this is what has given rise to the blisters. 12/04/17 on evaluation today patient presents for follow-up concerning his bilateral lower extremity edema in the right lower extremity ulcers. He has been tolerating the dressing changes without complication. With that being said he has had no real issues with the wraps which is also good news. Overall I'm pleased with the progress he's been making. 12/11/17 on evaluation today patient appears to be doing rather well in regard to his right lateral lower extremity ulcer. He's been tolerating the dressing changes without complication. Fortunately there does not appear to be any evidence of infection at this time. Overall I'm pleased with the progress that is being made. Unfortunately he has been in the hospital due to having what sounds to be a stomach virus/flu fortunately that is starting to get better. 12/18/17 on evaluation today patient actually appears to be doing very well in regard to his bilateral lower extremities the swelling is under fairly good control his lymphedema pumps are still not up and running quite yet. With that being said he does have several areas of opening noted as far as wounds are concerned mainly over the left lower extremity. With that being said I do believe once he gets lymphedema pumps this would least help you mention some the fluid and preventing this from occurring. Hopefully that will be set up soon sleeves are Artie in place at his home he just waiting for the machine. 12/25/17 on evaluation today patient actually appears to be doing excellent in fact  all of his ulcers appear to have resolved his SHONTEZ, SERMON. ([id number]) legs appear very well. I do think he needs compression stockings we have discussed this and they are actually going  to go to [place] today to elastic therapy to get this fitted for him. I think that is definitely a good thing to do. Readmission: 04/09/18 upon evaluation today this patient is seen for readmission due to bilateral lower extremity lymphedema. He has significant swelling of his extremities especially on the left although the right is also swollen he has weeping from both sides. There are no obvious open wounds at this point. Fortunately he has been doing fairly well for quite a bit of time since I last saw him. Nonetheless unfortunately this seems to have reopened and is giving quite a bit of trouble. He states this began about a week ago when he first called [place] to get in to be seen. No fevers, chills, nausea, or vomiting noted at this time. He has not been using his lymphedema pumps due to the fact that they won't fit on his leg at this point likewise is also not been using his compression for essentially the same reason. 04/16/18 upon evaluation today patient actually appears to be doing a little better in regard to the fluid in his bilateral lower extremities. With that being said he's had three falls since I saw him last week. He also states that he's been feeling very poorly. I was concerned last week and feel like that the concern is still there as far as the congestion in his chest is concerned he seems to be breathing about the same as last week but again he states he's very weak he's not even able to walk further than from the chair to the door. His wife had to buy a wheelchair just to be able to get them out of the house to get to the appointment today. This has me very concerned. 04/23/18 on evaluation today patient actually appears to be doing much better than last week's evaluation. At that  time actually had to transport him to the ER via EMS and he subsequently was admitted for acute pulmonary edema, acute renal failure, and acute congestive heart failure. Fortunately he is doing much better. Apparently they did dialyze him and were able to take off roughly 35 pounds of fluid. Nonetheless he is feeling much better both in regard to his breathing and he's able to get around much better at this time compared to previous. Overall I'm very happy with how things are at this time. There does not appear to be any evidence of infection currently. No fevers, chills, nausea, or vomiting noted at this time. 04/30/2018 patient seen today for follow-up and management of bilateral lower extremity lymphedema. He did express being more sad today than usual due to the recent loss of his dog. He states that he has been compliant with using the lymphedema pumps. However he does admit a minute over the last 2-3 days he has not been using the pumps due to the recent loss of his dog. At this time there is no drainage or open wounds to his lower extremities. The left leg edema is measuring smaller today. Still has a significant amount of edema on bilateral lower extremities With dry flaky skin. He will be referred to the lymphedema clinic for further management. Will continue 3 layer compression wraps and follow-up in 1-2 weeks.Denies any pain, fever, chairs recently. No recent falls or injuries reported during this visit. 05/07/18 on evaluation today patient actually appears to be doing very well in regard to his lower extremities in general all things considered. With that being said he is having some  pain in the legs just due to the amount of swelling. He does have an area where he had a blister on the left lateral lower extremity this is open at this point other than that there's nothing else weeping at this time. 05/14/18 on evaluation today patient actually appears to be doing excellent all things  considered in regard to his lower extremities. He still has a couple areas of weeping on each leg which has continued to be the issue for him. He does have an appointment with the lymphedema clinic although this isn't until February 2020. That was the earliest they had. In the meantime he has continued to tolerate the compression wraps without complication. 05/28/18 on evaluation today patient actually appears to be doing more poorly in regard to his left lower extremity where he has a wound open at this point. He also had a fall where he subsequently injured his right great toe which has led to an open wound at the site unfortunately. He has been tolerating the dressing changes without complication in general as far as the wraps are concerned that he has not been putting any dressing on the left 1st toe ulcer site. Electronic Signature(s) Signed: 05/28/2018 1:28:21 PM By: Worthy Keeler PA-C Entered By: Worthy Keeler on 05/28/2018 13:26:04 Adam Merritt ([id number]) -------------------------------------------------------------------------------- Physical Exam Details Patient Name: Adam Merritt Date of Service: 05/28/2018 11:15 AM Medical Record Number: [id number] Patient Account Number: [id number] Date of Birth/Sex: 11/29/1945 (72 y.o. M) Treating RN: Montey Hora Primary Care Provider: [name] Other Clinician: Referring Provider: [name] Treating Provider/Extender: STONE III, Apurva Reily [name]: 7 Constitutional Well-nourished and well-hydrated in no acute distress. Respiratory normal breathing without difficulty. Psychiatric this patient is able to make decisions and demonstrates good insight into disease process. Alert and Oriented x 3. pleasant and cooperative. Notes Patient's wound currently on the right great toe did require sharp debridement today which the patient tolerated with no pain he has neuropathy. Post debridement the wound bed appears to be doing  much better. In regard to left lateral lower extremity there did not appear to be any need for sharp debridement today none was performed. Electronic Signature(s) Signed: 05/28/2018 1:28:21 PM By: Worthy Keeler PA-C Entered By: Worthy Keeler on 05/28/2018 13:26:33 Adam Merritt ([id number]) -------------------------------------------------------------------------------- Physician Orders Details Patient Name: Adam Merritt Date of Service: 05/28/2018 11:15 AM Medical Record Number: [id number] Patient Account Number: [id number] Date of Birth/Sex: 14-Dec-1945 (72 y.o. 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 o Cleanse wound with mild soap and water Wound #15 Left Lower Leg o Clean wound with Normal Saline. o Cleanse wound with mild soap and water Skin Barriers/Peri-Wound Care Wound #15 Left Lower Leg o Moisturizing lotion - to both lower legs Primary Wound Dressing Wound #14 Right Toe Great o Silver Collagen Wound #15 Left Lower Leg o Silver Alginate Secondary Dressing Wound #14 Right Toe Great o Gauze and Kerlix/Conform Wound #15 Left Lower Leg o Gauze and Kerlix/Conform o ABD pad Dressing Change Frequency  Wound #14 Right Toe Great o Change Dressing Monday, Wednesday, Friday Wound #15 Left Lower Leg o Change Dressing Monday, Wednesday, Friday NIHAR, KLUS ([id number]) Follow-up Appointments Wound #14 Right Toe  Great o Return Appointment in 2 weeks. Wound #15 Left Lower Leg o Return Appointment in 2 weeks. Edema Control Wound #14 Right Toe Great o Other: - tubigrip Wound #15 Left Lower Leg o Other: - [place] Wound #14 Right [place] Nurse may visit PRN to address patientos wound care needs. o FACE TO FACE ENCOUNTER: MEDICARE and MEDICAID PATIENTS: I certify that this patient is under my care and that I had a face-to-face encounter that meets the physician face-to-face encounter requirements with this patient on this date. The encounter with the patient was in whole or in part for the following MEDICAL CONDITION: (primary reason for [place]) MEDICAL NECESSITY: I certify, that based on my findings, NURSING services are a medically necessary home health service. HOME BOUND STATUS: I certify that my clinical findings support that this patient is homebound (i.e., Due to illness or injury, pt requires aid of supportive devices such as crutches, cane, wheelchairs, walkers, the use of special transportation or the assistance of another person to leave their place of residence. There is a normal inability to leave the home and doing so requires considerable and taxing effort. Other absences are for medical reasons / religious services and are infrequent or of short duration when for other reasons). o If current dressing causes regression in wound condition, may D/C ordered dressing product/s and apply Normal Saline Moist Dressing daily until next [place] / Other MD appointment. [place] of regression in wound condition at [phone]. o Please direct any NON-WOUND related issues/requests for orders to patient's Primary Care Physician Wound #15 Left Lower Leg o [place] Nurse may visit PRN to address patientos wound care needs. o FACE TO FACE  ENCOUNTER: MEDICARE and MEDICAID PATIENTS: I certify that this patient is under my care and that I had a face-to-face encounter that meets the physician face-to-face encounter requirements with this patient on this date. The encounter with the patient was in whole or in part for the following MEDICAL CONDITION: (primary reason for [place]) MEDICAL NECESSITY: I certify, that based on my findings, NURSING services are a medically necessary home health service. HOME BOUND STATUS: I certify that my clinical findings support that this patient is homebound (i.e., Due to illness or injury, pt requires aid of supportive devices such as crutches, cane, wheelchairs, walkers, the use of special transportation or the assistance of another person to leave their place of residence. There is a normal inability to leave the home and doing so requires considerable and taxing effort. Other absences are for medical reasons / religious services and are infrequent or of short duration when for other reasons). o If current dressing causes regression in wound condition, may D/C ordered dressing product/s and apply Normal Saline Moist Dressing daily until next [place] / Other MD appointment. [place] of regression in wound condition at [phone]. o Please direct any NON-WOUND related issues/requests for orders to patient's Primary Care Physician Electronic Signature(s) Signed: 05/28/2018 1:28:21 PM By: Worthy Keeler PA-C Signed: 05/28/2018 1:47:23 PM By: Leonides Cave ([id number]) Entered By: Montey Hora on 05/28/2018 12:36:46 SHELBY, ANDERLE ([id number]) -------------------------------------------------------------------------------- Problem List Details Patient Name: BERNABE, DORCE.  Date of Service: 05/28/2018 11:15 AM Medical Record Number: [id number] Patient Account Number: [id number] Date of Birth/Sex: [birth year] (72 y.o. M) Treating RN: Montey Hora Primary Care Provider: [name] Other Clinician: Referring Provider: [name] Treating Provider/Extender: Melburn Hake, Cartina Brousseau [name]: 7 Active Problems ICD-10 Evaluated Encounter Code Description Active Date Today Diagnosis I89.0 Lymphedema, not elsewhere classified 04/09/2018 No Yes L97.512 Non-pressure chronic ulcer of other part of right foot with fat 05/28/2018 No Yes layer exposed L97.822 Non-pressure chronic ulcer of other part of left lower leg with 05/28/2018 No Yes fat layer exposed E66.9 Obesity, unspecified 04/09/2018 No Yes Inactive Problems Resolved Problems ICD-10 Code Description Active Date Resolved Date L03.115 Cellulitis of right lower limb 04/09/2018 04/09/2018 L03.116 Cellulitis of left lower limb 04/09/2018 04/09/2018 Electronic Signature(s) Signed: 05/28/2018 1:28:21 PM By: Worthy Keeler PA-C Entered By: Worthy Keeler on 05/28/2018 12:15:04 Adam Merritt ([id number]) -------------------------------------------------------------------------------- Progress Note Details Patient Name: Adam Merritt Date of Service: 05/28/2018 11:15 AM Medical Record Number: [id number] Patient Account Number: [id number] Date of Birth/Sex: 1946/04/03 (72 y.o. M) Treating RN: Montey Hora Primary Care Provider: [name] Other Clinician: Referring Provider: [name] Treating Provider/Extender: Melburn Hake, Filiberto Wamble [name]: 7 Subjective Chief Complaint Information obtained from Patient he is here for evaluation of bilateral lower extremity lymphedema right right toe and left LE ulcers History of Present Illness (HPI) 10/18/17-He is here for initial evaluation of bilateral lower extremity ulcerations in the presence of venous insufficiency and lymphedema. He has been seen by vascular medicine in the past, Dr. Lucky Cowboy, last seen in 2016. He does have a history of abnormal ABIs, which is to be expected given his lymphedema and venous insufficiency.  According to Epic, it appears that all attempts for arterial evaluation and/or angiography were not follow through with by patient. He does have a history of being seen in lymphedema clinic in 2018, stopped going approximately 6 months ago stating "it didn't do any good". He does not have lymphedema pumps, he does not have custom fit compression wrap/stockings. He is diabetic and his recent A1c last month was 7.6. He admits to chronic bilateral lower extremity pain, no change in pain since blister and ulceration development. He is currently being treated with Levaquin for bronchitis. He has home health and we will continue. 10/25/17-He is here in follow-up evaluation for bilateral lower extremity ulcerationssubtle he remains on Levaquin for bronchitis. Right lower extremity with no evidence of drainage or ulceration, persistent left lower extremity ulceration. He states that home health has not been out since his appointment. He went to [facility] on Tuesday, studies revealed: RIGHT ABI 0.9, TBI 0.6 LEFT ABI 1.1, TBI 0.6 with triphasic flow bilaterally. We will continue his same treatment plan. He has been educated on compression therapy and need for elevation. He will benefit from lymphedema pumps 11/01/17-He is here in follow-up evaluation for left lower extremity ulcer. The right lower extremity remains healed. He has home health services, but they have not been out to see the patient for 2-3 weeks. He states it home health physical therapy changed his dressing yesterday after therapy; he placed Ace wrap compression. We are still waiting for lymphedema pumps, reordered d/t need for company change. 11/08/17-He is here in follow-up evaluation for left lower extremity ulcer. It is improved. Edema is significantly improved with compression therapy. We will continue with same treatment plan and he will follow-up next week. No word regarding lymphedema pumps 11/15/17-He is here in  follow-up  evaluation for left lower extremity ulcer. He is healed and will be discharged from wound care services. I have reached out to [facility] regarding his lymphedema pumps. They have been unable to reach the patient; the contact number they had with the patient's wife's cell phone and she has not answered any unrecognized calls. Contact should be made today, trial planned for next week; [facility] will continue to follow 11/27/17 on evaluation today patient has multiple blistered areas over the right lower extremity his left lower extremity appears to be doing okay. These blistered areas show signs of no infection which is great news. With that being said he did have some necrotic skin overlying which was mechanically debrided away with saline and gauze today without complication. Overall post debridement the wounds appear to be doing better but in general his swelling seems to be increased. This is obviously not good news. I think this is what has given rise to the blisters. 12/04/17 on evaluation today patient presents for follow-up concerning his bilateral lower extremity edema in the right lower extremity ulcers. He has been tolerating the dressing changes without complication. With that being said he has had no real issues with the wraps which is also good news. Overall I'm pleased with the progress he's been making. 12/11/17 on evaluation today patient appears to be doing rather well in regard to his right lateral lower extremity ulcer. He's been tolerating the dressing changes without complication. Fortunately there does not appear to be any evidence of infection at this time. Overall I'm pleased with the progress that is being made. Unfortunately he has been in the hospital due to having what sounds to be a stomach virus/flu fortunately that is starting to get better. 12/18/17 on evaluation today patient actually appears to be doing very well in regard to his bilateral lower extremities  the AMARIS, GARRETTE. ([id number]) swelling is under fairly good control his lymphedema pumps are still not up and running quite yet. With that being said he does have several areas of opening noted as far as wounds are concerned mainly over the left lower extremity. With that being said I do believe once he gets lymphedema pumps this would least help you mention some the fluid and preventing this from occurring. Hopefully that will be set up soon sleeves are Artie in place at his home he just waiting for the machine. 12/25/17 on evaluation today patient actually appears to be doing excellent in fact all of his ulcers appear to have resolved his legs appear very well. I do think he needs compression stockings we have discussed this and they are actually going to go to [place] today to elastic therapy to get this fitted for him. I think that is definitely a good thing to do. Readmission: 04/09/18 upon evaluation today this patient is seen for readmission due to bilateral lower extremity lymphedema. He has significant swelling of his extremities especially on the left although the right is also swollen he has weeping from both sides. There are no obvious open wounds at this point. Fortunately he has been doing fairly well for quite a bit of time since I last saw him. Nonetheless unfortunately this seems to have reopened and is giving quite a bit of trouble. He states this began about a week ago when he first called [place] to get in to be seen. No fevers, chills, nausea, or vomiting noted at this time. He has not been using his lymphedema pumps due to the  fact that they won't fit on his leg at this point likewise is also not been using his compression for essentially the same reason. 04/16/18 upon evaluation today patient actually appears to be doing a little better in regard to the fluid in his bilateral lower extremities. With that being said he's had three falls since I saw him last week. He also states  that he's been feeling very poorly. I was concerned last week and feel like that the concern is still there as far as the congestion in his chest is concerned he seems to be breathing about the same as last week but again he states he's very weak he's not even able to walk further than from the chair to the door. His wife had to buy a wheelchair just to be able to get them out of the house to get to the appointment today. This has me very concerned. 04/23/18 on evaluation today patient actually appears to be doing much better than last week's evaluation. At that time actually had to transport him to the ER via EMS and he subsequently was admitted for acute pulmonary edema, acute renal failure, and acute congestive heart failure. Fortunately he is doing much better. Apparently they did dialyze him and were able to take off roughly 35 pounds of fluid. Nonetheless he is feeling much better both in regard to his breathing and he's able to get around much better at this time compared to previous. Overall I'm very happy with how things are at this time. There does not appear to be any evidence of infection currently. No fevers, chills, nausea, or vomiting noted at this time. 04/30/2018 patient seen today for follow-up and management of bilateral lower extremity lymphedema. He did express being more sad today than usual due to the recent loss of his dog. He states that he has been compliant with using the lymphedema pumps. However he does admit a minute over the last 2-3 days he has not been using the pumps due to the recent loss of his dog. At this time there is no drainage or open wounds to his lower extremities. The left leg edema is measuring smaller today. Still has a significant amount of edema on bilateral lower extremities With dry flaky skin. He will be referred to the lymphedema clinic for further management. Will continue 3 layer compression wraps and follow-up in 1-2 weeks.Denies any pain,  fever, chairs recently. No recent falls or injuries reported during this visit. 05/07/18 on evaluation today patient actually appears to be doing very well in regard to his lower extremities in general all things considered. With that being said he is having some pain in the legs just due to the amount of swelling. He does have an area where he had a blister on the left lateral lower extremity this is open at this point other than that there's nothing else weeping at this time. 05/14/18 on evaluation today patient actually appears to be doing excellent all things considered in regard to his lower extremities. He still has a couple areas of weeping on each leg which has continued to be the issue for him. He does have an appointment with the lymphedema clinic although this isn't until February 2020. That was the earliest they had. In the meantime he has continued to tolerate the compression wraps without complication. 05/28/18 on evaluation today patient actually appears to be doing more poorly in regard to his left lower extremity where he has a wound open at this point.  He also had a fall where he subsequently injured his right great toe which has led to an open wound at the site unfortunately. He has been tolerating the dressing changes without complication in general as far as the wraps are concerned that he has not been putting any dressing on the left 1st toe ulcer site. Patient History Information obtained from Patient. DAMARIUS, KARNES ([id number]) Family History Cancer - Paternal Grandparents, Kidney Disease - Siblings, Lung Disease - Mother, No family history of Diabetes, Heart Disease, Hereditary Spherocytosis, Hypertension, Seizures, Stroke, Thyroid Problems, Tuberculosis. Social History Never smoker, Marital Status - Married, Alcohol Use - Never, Drug Use - No History, Caffeine Use - Never. Medical History Hospitalization/Surgery History - 11/03/2013, [facility], bronchitis. Medical And  Surgical History Notes Respiratory home O2 at night as needed Cardiovascular varicose veins Neurologic CVA - 1992 Review of Systems (ROS) Constitutional Symptoms (General Health) Denies complaints or symptoms of Fatigue, Fever, Chills. Respiratory The patient has no complaints or symptoms. Cardiovascular The patient has no complaints or symptoms. Psychiatric The patient has no complaints or symptoms. Objective Constitutional Well-nourished and well-hydrated in no acute distress. Vitals Time Taken: 11:55 AM, Height: 66 in, Weight: 323 lbs, BMI: 52.1, Temperature: 97.8 F, Pulse: 79 bpm, Respiratory Rate: 18 breaths/min, Blood Pressure: 169/87 mmHg. Respiratory normal breathing without difficulty. Psychiatric this patient is able to make decisions and demonstrates good insight into disease process. Alert and Oriented x 3. pleasant and cooperative. General Notes: Patient's wound currently on the right great toe did require sharp debridement today which the patient tolerated with no pain he has neuropathy. Post debridement the wound bed appears to be doing much better. In regard to left lateral lower extremity there did not appear to be any need for sharp debridement today none was performed. LIONELL, MATUSZAK ([id number]) Integumentary (Hair, Skin) Wound #14 status is Open. Original cause of wound was Trauma. The wound is located on the Right Toe Great. The wound measures 1.3cm length x 1.4cm width x 0.1cm depth; 1.429cm^2 area and 0.143cm^3 volume. There is Fat Layer (Subcutaneous Tissue) Exposed exposed. There is no tunneling noted, however, there is undermining starting at 3:00 and ending at 9:00 with a maximum distance of 0.5cm. There is a none present amount of drainage noted. The wound margin is flat and intact. There is medium (34-66%) pink granulation within the wound bed. There is a small (1-33%) amount of necrotic tissue within the wound bed including Adherent Slough. The  periwound skin appearance exhibited: Excoriation. The periwound skin appearance did not exhibit: Callus, Crepitus, Induration, Rash, Scarring, Dry/Scaly, Maceration, Atrophie Blanche, Cyanosis, Ecchymosis, Hemosiderin Staining, Mottled, Pallor, Rubor, Erythema. Periwound temperature was noted as No Abnormality. The periwound has tenderness on palpation. Wound #15 status is Open. Original cause of wound was Trauma. The wound is located on the Left Lower Leg. The wound measures 1.2cm length x 0.9cm width x 0.1cm depth; 0.848cm^2 area and 0.085cm^3 volume. There is Fat Layer (Subcutaneous Tissue) Exposed exposed. There is no tunneling or undermining noted. There is a medium amount of serous drainage noted. The wound margin is flat and intact. There is large (67-100%) red, hyper - granulation within the wound bed. There is no necrotic tissue within the wound bed. 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 There was a Excisional Skin/Subcutaneous Tissue Debridement with a total area of 1.82 sq cm performed by STONE III, Aurthur Wingerter E., PA-C. With the following instrument(s): Curette to remove Viable and Non-Viable tissue/material. Material removed includes Subcutaneous Tissue and Slough and after achieving pain control using Lidocaine 4% Topical Solution. No specimens were taken. A time out was conducted at 12:29, prior to the start of the procedure. A Minimum amount of bleeding was controlled with Pressure. The procedure was tolerated well with a pain level of 0 throughout and a pain level of 0 following the procedure. Post Debridement Measurements: 1.3cm length x 1.4cm width x 0.2cm depth;  0.286cm^3 volume. Character of Wound/Ulcer Post Debridement is improved. Post procedure Diagnosis Wound #14: Same as Pre-Procedure Plan Wound Cleansing: Wound #14 Right Toe Great: Clean wound with Normal Saline. PLACIDO, HANGARTNER ([id number]) Cleanse wound with mild soap and water Wound #15 Left Lower Leg: Clean wound with Normal Saline. Cleanse wound with mild soap and water Skin Barriers/Peri-Wound Care: Wound #15 Left Lower Leg: Moisturizing lotion - to both lower legs Primary Wound Dressing: Wound #14 Right Toe Great: Silver Collagen Wound #15 Left Lower Leg: Silver Alginate Secondary Dressing: Wound #14 Right Toe Great: Gauze and Kerlix/Conform Wound #15 Left Lower Leg: Gauze and Kerlix/Conform ABD pad Dressing Change Frequency: Wound #14 Right Toe Great: Change Dressing Monday, Wednesday, Friday Wound #15 Left Lower Leg: Change Dressing Monday, Wednesday, Friday Follow-up Appointments: Wound #14 Right Toe Great: Return Appointment in 2 weeks. Wound #15 Left Lower Leg: Return Appointment in 2 weeks. Edema Control: Wound #14 Right Toe Great: Other: - tubigrip Wound #15 Left Lower Leg: Other: - tubigrip Home Health: Wound #14 Right Toe Great: [place] Nurse may visit PRN to address patient s wound care needs. FACE TO FACE ENCOUNTER: MEDICARE and MEDICAID PATIENTS: I certify that this patient is under my care and that I had a face-to-face encounter that meets the physician face-to-face encounter requirements with this patient on this date. The encounter with the patient was in whole or in part for the following MEDICAL CONDITION: (primary reason for [place]) MEDICAL NECESSITY: I certify, that based on my findings, NURSING services are a medically necessary home health service. HOME BOUND STATUS: I certify that my clinical findings support that this patient is homebound (i.e., Due to illness or injury, pt requires aid of  supportive devices such as crutches, cane, wheelchairs, walkers, the use of special transportation or the assistance of another person to leave their place of residence. There is a normal inability to leave the home and doing so requires considerable and taxing effort. Other absences are for medical reasons / religious services and are infrequent or of short duration when for other reasons). If current dressing causes regression in wound condition, may D/C ordered dressing product/s and apply Normal Saline Moist Dressing daily until next [place] / Other MD appointment. [place] of regression in wound condition at [phone]. Please direct any NON-WOUND related issues/requests for orders to patient's Primary Care Physician Wound #15 Left Lower Leg: [place] Nurse may visit PRN to address patient s wound care needs. FACE TO FACE ENCOUNTER: MEDICARE and MEDICAID PATIENTS: I certify that this patient is under my care and that I had a face-to-face encounter that meets the physician face-to-face encounter requirements with this patient on this date. The encounter with the patient was in  whole or in part for the following MEDICAL CONDITION: (primary reason for Home Healthcare) MEDICAL NECESSITY: I certify, that based on my findings, NURSING services are a medically necessary home health service. HOME BOUND STATUS: I certify that my clinical findings support that this patient is homebound (i.e., Due to illness or injury, pt requires aid of supportive devices such as crutches, cane, wheelchairs, walkers, the use of special transportation or the assistance of another person to leave their place of residence. There is a normal inability to leave the CRISTO, AUSBURN. ([id number]) home and doing so requires considerable and taxing effort. Other absences are for medical reasons / religious services and are infrequent or of short duration when for  other reasons). If current dressing causes regression in wound condition, may D/C ordered dressing product/s and apply Normal Saline Moist Dressing daily until next [place] / Other MD appointment. [place] of regression in wound condition at [phone]. Please direct any NON-WOUND related issues/requests for orders to patient's Primary Care Physician My suggestion currently is gonna be that we go ahead and initiate treatment with the above wound care measures for the next week. The patient is in agreement with the plan. Subsequently we will see were things stand at follow-up. If anything changes or worsens in the meantime he will let me know. Please see above for specific wound care orders. We will see patient for re-evaluation in 2 week(s) here in the clinic. If anything worsens or changes patient will contact our office for additional recommendations. Electronic Signature(s) Signed: 05/28/2018 1:28:21 PM By: Worthy Keeler PA-C Entered By: Worthy Keeler on 05/28/2018 13:26:56 Adam Merritt ([id number]) -------------------------------------------------------------------------------- ROS/PFSH Details Patient Name: Adam Merritt Date of Service: 05/28/2018 11:15 AM Medical Record Number: [id number] Patient Account Number: [id number] Date of Birth/Sex: Sep 18, 1945 (72 y.o. M) Treating RN: Montey Hora Primary Care Provider: [name] Other Clinician: Referring Provider: [name] Treating Provider/Extender: Melburn Hake, Chennel Olivos [name]: 7 Information Obtained From Patient Wound History Do you currently have one or more open woundso No Have you tested positive for osteomyelitis (bone infection)o No Have you had any tests for circulation on your legso Yes Constitutional Symptoms (General Health) Complaints and Symptoms: Negative for: Fatigue; Fever; Chills Eyes Medical History: Negative for: Cataracts; Glaucoma; Optic  Neuritis Ear/Nose/Mouth/Throat Medical History: Negative for: Chronic sinus problems/congestion; Middle ear problems Hematologic/Lymphatic Medical History: Positive for: Anemia - aplastic anemia; Lymphedema Negative for: Hemophilia; Human Immunodeficiency Virus; Sickle Cell Disease Respiratory Complaints and Symptoms: No Complaints or Symptoms Medical History: Positive for: Chronic Obstructive Pulmonary Disease (COPD); Sleep Apnea Negative for: Aspiration; Asthma; Pneumothorax; Tuberculosis Past Medical History Notes: home O2 at night as needed Cardiovascular Complaints and Symptoms: No Complaints or Symptoms Medical History: Positive for: Congestive Heart Failure; Coronary Artery Disease; Hypertension; Peripheral Venous Disease Negative for: Angina; Arrhythmia; Deep Vein Thrombosis; Hypotension; Myocardial Infarction; Peripheral Arterial Disease; Phlebitis; Vasculitis Past Medical History Notes: LANGLEY, INGALLS ([id number]) varicose veins Gastrointestinal Medical History: Negative for: Cirrhosis ; Colitis; Crohnos; Hepatitis A; Hepatitis B; Hepatitis C Endocrine Medical History: Positive for: Type II Diabetes Negative for: Type I Diabetes Time with diabetes: since 2006 Treated with: Insulin Blood sugar tested every day: Yes Tested : Blood sugar testing results: Breakfast: 209 Genitourinary Medical History: Negative for: End Stage Renal Disease Immunological Medical History: Negative for: Lupus Erythematosus; Raynaudos; Scleroderma Integumentary (Skin) Medical History: Negative for: History of Burn; History of pressure wounds Musculoskeletal Medical History: Positive for: Osteoarthritis Negative for: Gout;  Rheumatoid Arthritis; Osteomyelitis Neurologic Medical History: Positive for: Neuropathy Negative for: Dementia; Quadriplegia; Paraplegia; Seizure Disorder Past Medical History Notes: CVA - 1992 Oncologic Medical History: Positive for: Received  Chemotherapy - last dose 2006 Negative for: Received Radiation Psychiatric Complaints and Symptoms: No Complaints or Symptoms Medical History: DARWYN, PONZO ([id number]) Negative for: Anorexia/bulimia; Confinement Anxiety Immunizations Pneumococcal Vaccine: Received Pneumococcal Vaccination: Yes Implantable Devices Hospitalization / Surgery History Name of Hospital Purpose of Hospitalization/Surgery Date [facility] bronchitis 11/03/2013 Family and Social History Cancer: Yes - Paternal Grandparents; Diabetes: No; Heart Disease: No; Hereditary Spherocytosis: No; Hypertension: No; Kidney Disease: Yes - Siblings; Lung Disease: Yes - Mother; Seizures: No; Stroke: No; Thyroid Problems: No; Tuberculosis: No; Never smoker; Marital Status - Married; Alcohol Use: Never; Drug Use: No History; Caffeine Use: Never; Financial Concerns: No; Food, Clothing or Shelter Needs: No; Support System Lacking: No; Transportation Concerns: No; Advanced Directives: No; Patient does not want information on Advanced Directives Physician Affirmation I have reviewed and agree with the above information. Electronic Signature(s) Signed: 05/28/2018 1:28:21 PM By: Worthy Keeler PA-C Signed: 05/28/2018 1:47:23 PM By: Montey Hora Entered By: Worthy Keeler on 05/28/2018 13:26:19 ORLIN, KANN ([id number]) -------------------------------------------------------------------------------- SuperBill Details Patient Name: Adam Merritt Date of Service: 05/28/2018 Medical Record Number: [id number] Patient Account Number: [id number] Date of Birth/Sex: March 13, 1946 (72 y.o. M) Treating RN: Montey Hora Primary Care Provider: [name] Other Clinician: Referring Provider: [name] Treating Provider/Extender: Melburn Hake, Hermon Zea [name]: 7 Diagnosis Coding ICD-10 Codes Code Description I89.0 Lymphedema, not elsewhere classified L97.512 Non-pressure chronic ulcer of other part of right foot with fat layer  exposed L97.822 Non-pressure chronic ulcer of other part of left lower leg with fat layer exposed E66.9 Obesity, unspecified Facility Procedures CPT4 Code: 44967591 Description: 11042 - DEB SUBQ TISSUE 20 SQ CM/< ICD-10 Diagnosis Description L97.512 Non-pressure chronic ulcer of other part of right foot with fat Modifier: layer exposed Quantity: 1 Physician Procedures CPT4 Code: [id number] Description: 11042 - WC PHYS SUBQ TISS 20 SQ CM ICD-10 Diagnosis Description L97.512 Non-pressure chronic ulcer of other part of right foot with fat Modifier: layer exposed Quantity: 1 Electronic Signature(s) Signed: 05/28/2018 1:28:21 PM By: Worthy Keeler PA-C Entered By: Worthy Keeler on 05/28/2018 13:27:42

## 2018-05-30 ENCOUNTER — Other Ambulatory Visit: Payer: Self-pay

## 2018-06-10 ENCOUNTER — Ambulatory Visit: Payer: Medicare Other | Admitting: Family

## 2018-06-11 ENCOUNTER — Encounter: Payer: Medicare Other | Attending: Physician Assistant | Admitting: Physician Assistant

## 2018-06-11 DIAGNOSIS — Z8673 Personal history of transient ischemic attack (TIA), and cerebral infarction without residual deficits: Secondary | ICD-10-CM | POA: Insufficient documentation

## 2018-06-11 DIAGNOSIS — Z6841 Body Mass Index (BMI) 40.0 and over, adult: Secondary | ICD-10-CM | POA: Diagnosis not present

## 2018-06-11 DIAGNOSIS — I2510 Atherosclerotic heart disease of native coronary artery without angina pectoris: Secondary | ICD-10-CM | POA: Diagnosis not present

## 2018-06-11 DIAGNOSIS — L97822 Non-pressure chronic ulcer of other part of left lower leg with fat layer exposed: Secondary | ICD-10-CM | POA: Insufficient documentation

## 2018-06-11 DIAGNOSIS — J449 Chronic obstructive pulmonary disease, unspecified: Secondary | ICD-10-CM | POA: Insufficient documentation

## 2018-06-11 DIAGNOSIS — E1140 Type 2 diabetes mellitus with diabetic neuropathy, unspecified: Secondary | ICD-10-CM | POA: Diagnosis not present

## 2018-06-11 DIAGNOSIS — L97512 Non-pressure chronic ulcer of other part of right foot with fat layer exposed: Secondary | ICD-10-CM | POA: Diagnosis not present

## 2018-06-11 DIAGNOSIS — I890 Lymphedema, not elsewhere classified: Secondary | ICD-10-CM | POA: Insufficient documentation

## 2018-06-11 DIAGNOSIS — L98492 Non-pressure chronic ulcer of skin of other sites with fat layer exposed: Secondary | ICD-10-CM | POA: Insufficient documentation

## 2018-06-11 DIAGNOSIS — I110 Hypertensive heart disease with heart failure: Secondary | ICD-10-CM | POA: Insufficient documentation

## 2018-06-11 DIAGNOSIS — Z9221 Personal history of antineoplastic chemotherapy: Secondary | ICD-10-CM | POA: Insufficient documentation

## 2018-06-11 DIAGNOSIS — I509 Heart failure, unspecified: Secondary | ICD-10-CM | POA: Diagnosis not present

## 2018-06-11 DIAGNOSIS — E11622 Type 2 diabetes mellitus with other skin ulcer: Secondary | ICD-10-CM | POA: Diagnosis present

## 2018-06-11 DIAGNOSIS — G4730 Sleep apnea, unspecified: Secondary | ICD-10-CM | POA: Insufficient documentation

## 2018-06-11 DIAGNOSIS — E669 Obesity, unspecified: Secondary | ICD-10-CM | POA: Diagnosis not present

## 2018-06-12 NOTE — Progress Notes (Signed)
MANOLITO, JUREWICZ ([id number]) Visit Report for 06/11/2018 Arrival Information Details Patient Name: Adam Merritt, Adam Merritt Date of Service: 06/11/2018 11:15 AM Medical Record Number: [id number] Patient Account Number: [id number] Date of Birth/Sex: 1946-03-03 (72 y.o. M) Treating RN: Montey Hora Primary Care Izela Altier: [name] Other Clinician: Referring Zollie Ellery: [name] Treating Zachry Hopfensperger/Extender: Melburn Hake, [name]: 9 Visit Information History Since Last Visit Added or deleted any medications: No Patient Arrived: Wheel Chair Any new allergies or adverse reactions: No Arrival Time: 11:27 Had a fall or experienced change in No Accompanied By: wife activities of daily living that may affect Transfer Assistance: Manual risk of falls: Patient Identification Verified: Yes Signs or symptoms of abuse/neglect since last visito No Secondary Verification Process Yes Hospitalized since last visit: No Completed: Implantable device outside of the clinic excluding No Patient Has Alerts: Yes cellular tissue based products placed in the center Patient Alerts: Patient on Blood since last visit: Thinner Has Dressing in Place as Prescribed: Yes Aspirin 325mg  Pain Present Now: Yes Type II Diabetes ABI 5/19 L 1.1 R 0.9 TBI L 0.6 R 0.6 Electronic Signature(s) Signed: 06/11/2018 3:06:21 PM By: Lorine Bears RCP, RRT, CHT Entered By: Lorine Bears on 06/11/2018 11:29:27 Adam Merritt ([id number]) -------------------------------------------------------------------------------- Lower Extremity Assessment Details Patient Name: Adam Merritt Date of Service: 06/11/2018 11:15 AM Medical Record Number: [id number] Patient Account Number: [id number] Date of Birth/Sex: [birth year] (72 y.o. M) Treating RN: [name] Ginger Primary Care Aveena Bari: [name] Other Clinician: Referring Micharl Helmes: [name] Treating Marianny Goris/Extender: Melburn Hake, [name]:  9 Edema Assessment Assessed: [Left: No] [Right: No] [Left: Edema] [Right: :] Calf Left: Right: Point of Measurement: 35 cm From Medial Instep cm cm Ankle Left: Right: Point of Measurement: 11 cm From Medial Instep cm cm Vascular Assessment Claudication: Claudication Assessment [Left:None] [Right:None] Pulses: Dorsalis Pedis Palpable: [Left:Yes] [Right:Yes] Posterior Tibial Extremity colors, hair growth, and conditions: Extremity Color: [Left:Hyperpigmented] [Right:Hyperpigmented] Hair Growth on Extremity: [Left:No] [Right:No] Temperature of Extremity: [Left:Warm] [Right:Warm] Capillary Refill: [Left:< 3 seconds] [Right:< 3 seconds] Toe Nail Assessment Left: Right: Thick: Yes Yes Discolored: No No Deformed: No No Improper Length and Hygiene: No No Electronic Signature(s) Signed: 06/11/2018 1:34:38 PM By: [name] Ginger Entered By: [name] Ginger on 06/11/2018 11:58:24 Adam Merritt ([id number]) -------------------------------------------------------------------------------- Multi Wound Chart Details Patient Name: Adam Merritt Date of Service: 06/11/2018 11:15 AM Medical Record Number: [id number] Patient Account Number: [id number] Date of Birth/Sex: March 20, 1946 (72 y.o. M) Treating RN: Montey Hora Primary Care [facility] Violet: [name] Other Clinician: Referring Mclane Arora: [name] Treating Morrigan Wickens/Extender: Melburn Hake, [name]: 9 Vital Signs Height(in): 30 Pulse(bpm): 95 Weight(lbs): 323 Blood Pressure(mmHg): 156/87 Body Mass Index(BMI): 52 Temperature(F): 98.4 Respiratory Rate 18 (breaths/min): Photos: [14:No Photos] [15:No Photos] [16:No Photos] Wound Location: [14:Right Toe Great] [15:Left Lower Leg] [16:Lower Leg - Lateral] Wounding Event: [14:Trauma] [15:Trauma] [16:Blister] Primary Etiology: [14:Trauma, Other] [15:Trauma, Other] [16:Venous Leg Ulcer] Comorbid History: [14:Anemia, Lymphedema, Chronic Obstructive Pulmonary Disease (COPD), Sleep Apnea,  Congestive Heart Failure, Coronary Artery Disease, Hypertension, Peripheral Venous Disease, Type II Diabetes, Osteoarthritis, Neuropathy, Received  Chemotherapy] [15:Anemia, Lymphedema, Chronic Obstructive Pulmonary Disease (COPD), Sleep Apnea, Congestive Heart Failure, Coronary Artery Disease, Hypertension, Peripheral Venous Disease, Type II Diabetes, Osteoarthritis, Neuropathy, Received  Chemotherapy] [16:Anemia, Lymphedema, Chronic Obstructive Pulmonary Disease (COPD), Sleep Apnea, Congestive Heart Failure, Coronary Artery Disease, Hypertension, Peripheral Venous Disease, Type II Diabetes, Osteoarthritis, Neuropathy, Received  Chemotherapy] Date Acquired: [14:05/22/2018] [15:05/22/2018] [16:05/20/2018] Weeks of Treatment: [14:2] [15:2] [16:0] Wound Status: [14:Open] [15:Open] [  16:Open] Measurements L x W x D [14:0.9x1.4x0.2] [15:5.4x32x0.1] [16:3x6x0.1] (cm) Area (cm) : [14:0.99] [15:135.717] [16:14.137] Volume (cm) : [14:0.198] [15:13.572] [16:1.414] % Reduction in Area: [14:30.70%] [15:-15904.40%] [16:N/A] % Reduction in Volume: [14:-38.50%] [15:-15867.10%] [16:N/A] Starting Position 1 [14:5] (o'clock): Ending Position 1 [14:8] (o'clock): Maximum Distance 1 (cm): [14:0.2] Undermining: [14:Yes] [15:No] [16:No] Classification: [14:Full Thickness Without Exposed Support Structures] [15:Full Thickness Without Exposed Support Structures] [16:Partial Thickness] Exudate Amount: [14:Small] [15:Large] [16:Large] Exudate Type: [14:Serous] [15:Serosanguineous] [16:Serosanguineous] Exudate Color: [14:amber] [15:red, brown] [16:red, brown] Wound Margin: [14:Flat and Intact] [15:Flat and Intact] [16:N/A] Granulation Amount: [14:None Present (0%)] [15:Large (67-100%)] [16:Large (67-100%)] Granulation Quality: [14:N/A] [15:Red, Hyper-granulation] [16:Red] Necrotic Amount: None Present (0%) Small (1-33%) Small (1-33%) Exposed Structures: Fat Layer (Subcutaneous Fat Layer (Subcutaneous Fascia:  No Tissue) Exposed: Yes Tissue) Exposed: Yes Fat Layer (Subcutaneous Fascia: No Fascia: No Tissue) Exposed: No Tendon: No Tendon: No Tendon: No Muscle: No Muscle: No Muscle: No Joint: No Joint: No Joint: No Bone: No Bone: No Bone: No Epithelialization: None None N/A Periwound Skin Texture: Excoriation: Yes Excoriation: No Excoriation: No Induration: No Induration: No Induration: No Callus: No Callus: No Callus: No Crepitus: No Crepitus: No Crepitus: No Rash: No Rash: No Rash: No Scarring: No Scarring: No Scarring: No Periwound Skin Moisture: Maceration: No Maceration: No Maceration: No Dry/Scaly: No Dry/Scaly: No Dry/Scaly: No Periwound Skin Color: Atrophie Blanche: No Atrophie Blanche: No Atrophie Blanche: No Cyanosis: No Cyanosis: No Cyanosis: No Ecchymosis: No Ecchymosis: No Ecchymosis: No Erythema: No Erythema: No Erythema: No Hemosiderin Staining: No Hemosiderin Staining: No Hemosiderin Staining: No Mottled: No Mottled: No Mottled: No Pallor: No Pallor: No Pallor: No Rubor: No Rubor: No Rubor: No Temperature: No Abnormality N/A N/A Tenderness on Palpation: Yes No No Wound Preparation: Ulcer Cleansing: Ulcer Cleansing: Ulcer Cleansing: Rinsed/Irrigated with Saline Rinsed/Irrigated with Saline, Rinsed/Irrigated with Saline, Other: soap and water Other: soap and water Topical Anesthetic Applied: Other: lidocaine 4% Topical Anesthetic Applied: Topical Anesthetic Applied: None Other: lidocaine 4% Wound Number: 17 N/A N/A Photos: No Photos N/A N/A Wound Location: Right Lower Leg - Lateral N/A N/A Wounding Event: Blister N/A N/A Primary Etiology: Venous Leg Ulcer N/A N/A Comorbid History: Anemia, Lymphedema, N/A N/A Chronic Obstructive Pulmonary Disease (COPD), Sleep Apnea, Congestive Heart Failure, Coronary Artery Disease, Hypertension, Peripheral Venous Disease, Type II Diabetes, Osteoarthritis, Neuropathy, Received  Chemotherapy Date Acquired: 05/20/2018 N/A N/A Weeks of Treatment: 0 N/A N/A Wound Status: Open N/A N/A Measurements L x W x D 8x10x0.1 N/A N/A (cm) Area (cm) : 25.366 N/A N/A Volume (cm) : 6.283 N/A N/A % Reduction in Area: N/A N/A N/A % Reduction in Volume: N/A N/A N/A Adam Merritt, Adam Merritt ([id number]) Undermining: No N/A N/A Classification: Partial Thickness N/A N/A Exudate Amount: Large N/A N/A Exudate Type: Serosanguineous N/A N/A Exudate Color: red, brown N/A N/A Wound Margin: N/A N/A N/A Granulation Amount: Large (67-100%) N/A N/A Granulation Quality: Red N/A N/A Necrotic Amount: Small (1-33%) N/A N/A Exposed Structures: Fascia: No N/A N/A Fat Layer (Subcutaneous Tissue) Exposed: No Tendon: No Muscle: No Joint: No Bone: No Epithelialization: N/A N/A N/A Periwound Skin Texture: Excoriation: No N/A N/A Induration: No Callus: No Crepitus: No Rash: No Scarring: No Periwound Skin Moisture: Maceration: No N/A N/A Dry/Scaly: No Periwound Skin Color: Atrophie Blanche: No N/A N/A Cyanosis: No Ecchymosis: No Erythema: No Hemosiderin Staining: No Mottled: No Pallor: No Rubor: No Temperature: N/A N/A N/A Tenderness on Palpation: No N/A N/A Wound Preparation: Ulcer Cleansing: N/A N/A Rinsed/Irrigated with Saline, Other: soap and water  Topical Anesthetic Applied: Other: lidocaine 4% Treatment Notes Electronic Signature(s) Signed: 06/11/2018 4:56:07 PM By: Montey Hora Entered By: Montey Hora on 06/11/2018 12:13:14 Adam Merritt ([id number]) -------------------------------------------------------------------------------- [place] Details Patient Name: Adam Merritt Date of Service: 06/11/2018 11:15 AM Medical Record Number: [id number] Patient Account Number: [id number] Date of Birth/Sex: [birth year] (72 y.o. M) Treating RN: Montey Hora Primary Care Bronwyn Belasco: [name] Other Clinician: Referring Jyll Tomaro: [name] Treating  Letonya Mangels/Extender: Melburn Hake, [name]: 9 Active Inactive Abuse / Safety / Falls / Self Care Management Nursing Diagnoses: Potential for falls Goals: Patient will remain injury free related to falls Date Initiated: 04/09/2018 Target Resolution Date: 07/13/2018 Goal Status: Active Interventions: Assess fall risk on admission and as needed Notes: Orientation to the Wound Care Program Nursing Diagnoses: Knowledge deficit related to the wound healing center program Goals: Patient/caregiver will verbalize understanding of the [facility] Date Initiated: 04/09/2018 Target Resolution Date: 07/13/2018 Goal Status: Active Interventions: Provide education on orientation to the wound center Notes: Venous Leg Ulcer Nursing Diagnoses: Actual venous Insuffiency (use after diagnosis is confirmed) Goals: Patient will maintain optimal edema control Date Initiated: 04/09/2018 Target Resolution Date: 07/13/2018 Goal Status: Active Interventions: Assess peripheral edema status every visit. Adam Merritt, Adam Merritt ([id number]) Compression as ordered Provide education on venous insufficiency Notes: Electronic Signature(s) Signed: 06/11/2018 4:56:07 PM By: Montey Hora Entered By: Montey Hora on 06/11/2018 12:12:53 Adam Merritt, Adam Merritt ([id number]) -------------------------------------------------------------------------------- Pain Assessment Details Patient Name: Adam Merritt Date of Service: 06/11/2018 11:15 AM Medical Record Number: [id number] Patient Account Number: [id number] Date of Birth/Sex: 1946/05/11 (72 y.o. M) Treating RN: Montey Hora Primary Care Taiana Temkin: [name] Other Clinician: Referring Luian Schumpert: [name] Treating Saphira Lahmann/Extender: Melburn Hake, [name]: 9 Active Problems Location of Pain Severity and Description of Pain Patient Has Paino Yes Site Locations Rate the pain. Current Pain Level: 5 Pain Management and  Medication Current Pain Management: Electronic Signature(s) Signed: 06/11/2018 3:06:21 PM By: Lorine Bears RCP, RRT, CHT Signed: 06/11/2018 4:56:07 PM By: Montey Hora Entered By: Lorine Bears on 06/11/2018 11:29:42 Adam Merritt ([id number]) -------------------------------------------------------------------------------- Patient/Caregiver Education Details Patient Name: Adam Merritt Date of Service: 06/11/2018 11:15 AM Medical Record Number: [id number] Patient Account Number: [id number] Date of Birth/Gender: 01/25/1946 (72 y.o. M) Treating RN: Montey Hora Primary Care Physician: [name] Other Clinician: Referring Physician: [name] Treating Physician/Extender: Sharalyn Ink in Treatment: 9 Education Assessment Education Provided To: Patient and Caregiver Education Topics Provided Venous: Handouts: Other: need for ongoing compression Methods: Explain/Verbal Responses: State content correctly Electronic Signature(s) Signed: 06/11/2018 4:56:07 PM By: Montey Hora Entered By: Montey Hora on 06/11/2018 13:24:32 Adam Merritt ([id number]) -------------------------------------------------------------------------------- Wound Assessment Details Patient Name: Adam Merritt Date of Service: 06/11/2018 11:15 AM Medical Record Number: [id number] Patient Account Number: [id number] Date of Birth/Sex: 08-Aug-1945 (73 y.o. M) Treating RN: [name] Ginger Primary Care Lakindra Wible: [name] Other Clinician: Referring Math Brazie: [name] Treating Bearett Porcaro/Extender: STONE III, [name]: 9 Wound Status Wound Number: 14 Primary Trauma, Other Etiology: Wound Location: Right Toe Great Wound Open Wounding Event: Trauma Status: Date Acquired: 05/22/2018 Comorbid Anemia, Lymphedema, Chronic Obstructive Weeks Of Treatment: 2 History: Pulmonary Disease (COPD), Sleep Apnea, Clustered Wound: No Congestive Heart Failure, Coronary  Artery Disease, Hypertension, Peripheral Venous Disease, Type II Diabetes, Osteoarthritis, Neuropathy, Received Chemotherapy Photos Photo Uploaded By: [name] Ginger on 06/11/2018 13:28:34 Wound Measurements Length: (cm) 0.9 % Reduct Width: (cm) 1.4 % Reduct Depth: (cm) 0.2 Epitheli  Area: (cm) 0.99 Tunneli Volume: (cm) 0.198 Undermi Start Endin Maxim ion in Area: 30.7% ion in Volume: -38.5% alization: None ng: No ning: Yes ing Position (o'clock): 5 g Position (o'clock): 8 um Distance: (cm) 0.2 Wound Description Full Thickness Without Exposed Support Foul Od Classification: Structures Slough/ Wound Margin: Flat and Intact Exudate Small Amount: Exudate Type: Serous Exudate Color: amber or After Cleansing: No Fibrino No Wound Bed Adam Merritt, Adam Merritt ([id number]) Granulation Amount: None Present (0%) Exposed Structure Necrotic Amount: None Present (0%) Fascia Exposed: No Fat Layer (Subcutaneous Tissue) Exposed: Yes Tendon Exposed: No Muscle Exposed: No Joint Exposed: No Bone Exposed: No Periwound Skin Texture Texture Color No Abnormalities Noted: No No Abnormalities Noted: No Callus: No Atrophie Blanche: No Crepitus: No Cyanosis: No Excoriation: Yes Ecchymosis: No Induration: No Erythema: No Rash: No Hemosiderin Staining: No Scarring: No Mottled: No Pallor: No Moisture Rubor: No No Abnormalities Noted: No Dry / Scaly: No Temperature / Pain Maceration: No Temperature: No Abnormality Tenderness on Palpation: Yes Wound Preparation Ulcer Cleansing: Rinsed/Irrigated with Saline Topical Anesthetic Applied: Other: lidocaine 4%, Treatment Notes Wound #14 (Right Toe Great) Notes toe - prisma, foam and coverlet; others silvercel, xtrasorb and 3 layer wrap bilateral Electronic Signature(s) Signed: 06/11/2018 1:34:38 PM By: [name] Ginger Entered By: [name] Ginger on 06/11/2018 11:45:30 Adam Merritt  ([id number]) -------------------------------------------------------------------------------- Wound Assessment Details Patient Name: Adam Merritt Date of Service: 06/11/2018 11:15 AM Medical Record Number: [id number] Patient Account Number: [id number] Date of Birth/Sex: April 06, 1946 (72 y.o. M) Treating RN: [name] Ginger Primary Care Chuck Caban: [name] Other Clinician: Referring Declan Adamson: [name] Treating Jurgen Groeneveld/Extender: STONE III, [name]: 9 Wound Status Wound Number: 15 Primary Trauma, Other Etiology: Wound Location: Left Lower Leg Wound Open Wounding Event: Trauma Status: Date Acquired: 05/22/2018 Comorbid Anemia, Lymphedema, Chronic Obstructive Weeks Of Treatment: 2 History: Pulmonary Disease (COPD), Sleep Apnea, Clustered Wound: No Congestive Heart Failure, Coronary Artery Disease, Hypertension, Peripheral Venous Disease, Type II Diabetes, Osteoarthritis, Neuropathy, Received Chemotherapy Photos Photo Uploaded By: [name] Ginger on 06/11/2018 13:28:35 Wound Measurements Length: (cm) 5.4 Width: (cm) 32 Depth: (cm) 0.1 Area: (cm) 135.717 Volume: (cm) 13.572 % Reduction in Area: -15904.4% % Reduction in Volume: -15867.1% Epithelialization: None Tunneling: No Undermining: No Wound Description Full Thickness Without Exposed Support Foul Od Classification: Structures Slough/ Wound Margin: Flat and Intact Exudate Large Amount: Exudate Type: Serosanguineous Exudate Color: red, brown or After Cleansing: No Fibrino No Wound Bed Granulation Amount: Large (67-100%) Exposed Structure Granulation Quality: Red, Hyper-granulation Fascia Exposed: No Necrotic Amount: Small (1-33%) Fat Layer (Subcutaneous Tissue) Exposed: Yes Necrotic Quality: Adherent Slough Tendon Exposed: No Adam Merritt, Adam Merritt ([id number]) Muscle Exposed: No Joint Exposed: No Bone Exposed: No Periwound Skin Texture Texture Color No Abnormalities Noted: No No Abnormalities Noted:  No Callus: No Atrophie Blanche: No Crepitus: No Cyanosis: No Excoriation: No Ecchymosis: No Induration: No Erythema: No Rash: No Hemosiderin Staining: No Scarring: No Mottled: No Pallor: No Moisture Rubor: No No Abnormalities Noted: No Dry / Scaly: No Maceration: No Wound Preparation Ulcer Cleansing: Rinsed/Irrigated with Saline, Other: soap and water, Topical Anesthetic Applied: None Treatment Notes Wound #15 (Left Lower Leg) Notes toe - prisma, foam and coverlet; others silvercel, xtrasorb and 3 layer wrap bilateral Electronic Signature(s) Signed: 06/11/2018 1:34:38 PM By: [name] Ginger Entered By: [name] Ginger on 06/11/2018 11:49:15 Adam Merritt ([id number]) -------------------------------------------------------------------------------- Wound Assessment Details Patient Name: Adam Merritt Date of Service: 06/11/2018 11:15 AM Medical Record Number: [id number] Patient Account Number: [id number] Date of Birth/Sex: 1946-03-03 (72  y.o. M) Treating RN: [name] Ginger Primary Care Gorman Safi: [name] Other Clinician: Referring Kirsta Probert: [name] Treating Kosei Rhodes/Extender: STONE III, [name]: 9 Wound Status Wound Number: 16 Primary Venous Leg Ulcer Etiology: Wound Location: Lower Leg - Lateral Wound Open Wounding Event: Blister Status: Date Acquired: 05/20/2018 Comorbid Anemia, Lymphedema, Chronic Obstructive Weeks Of Treatment: 0 History: Pulmonary Disease (COPD), Sleep Apnea, Clustered Wound: No Congestive Heart Failure, Coronary Artery Disease, Hypertension, Peripheral Venous Disease, Type II Diabetes, Osteoarthritis, Neuropathy, Received Chemotherapy Photos Photo Uploaded By: [name] Ginger on 06/11/2018 13:29:48 Wound Measurements Length: (cm) 3 % Reduction Width: (cm) 6 % Reduction Depth: (cm) 0.1 Tunneling: Area: (cm) 14.137 Underminin Volume: (cm) 1.414 in Area: in Volume: No g: No Wound Description Classification: Partial Thickness  Foul Odor Exudate Amount: Large Slough/Fib Exudate Type: Serosanguineous Exudate Color: red, brown After Cleansing: No rino Yes Wound Bed Granulation Amount: Large (67-100%) Exposed Structure Granulation Quality: Red Fascia Exposed: No Necrotic Amount: Small (1-33%) Fat Layer (Subcutaneous Tissue) Exposed: No Necrotic Quality: Adherent Slough Tendon Exposed: No Muscle Exposed: No Joint Exposed: No Bone Exposed: No Adam Merritt, Adam Merritt. ([id number]) Periwound Skin Texture Texture Color No Abnormalities Noted: No No Abnormalities Noted: No Callus: No Atrophie Blanche: No Crepitus: No Cyanosis: No Excoriation: No Ecchymosis: No Induration: No Erythema: No Rash: No Hemosiderin Staining: No Scarring: No Mottled: No Pallor: No Moisture Rubor: No No Abnormalities Noted: No Dry / Scaly: No Maceration: No Wound Preparation Ulcer Cleansing: Rinsed/Irrigated with Saline, Other: soap and water, Topical Anesthetic Applied: Other: lidocaine 4%, Treatment Notes Wound #16 (Lateral Lower Leg) Notes toe - prisma, foam and coverlet; others silvercel, xtrasorb and 3 layer wrap bilateral Electronic Signature(s) Signed: 06/11/2018 1:34:38 PM By: [name] Ginger Entered By: [name] Ginger on 06/11/2018 11:54:31 Adam Merritt ([id number]) -------------------------------------------------------------------------------- Wound Assessment Details Patient Name: Adam Merritt Date of Service: 06/11/2018 11:15 AM Medical Record Number: [id number] Patient Account Number: [id number] Date of Birth/Sex: 28-Apr-1946 (72 y.o. M) Treating RN: [name] Ginger Primary Care Esai Stecklein: [name] Other Clinician: Referring Anacleto Batterman: [name] Treating Zymeir Salminen/Extender: STONE III, [name]: 9 Wound Status Wound Number: 17 Primary Venous Leg Ulcer Etiology: Wound Location: Right Lower Leg - Lateral Wound Open Wounding Event: Blister Status: Date Acquired: 05/20/2018 Comorbid Anemia, Lymphedema,  Chronic Obstructive Weeks Of Treatment: 0 History: Pulmonary Disease (COPD), Sleep Apnea, Clustered Wound: No Congestive Heart Failure, Coronary Artery Disease, Hypertension, Peripheral Venous Disease, Type II Diabetes, Osteoarthritis, Neuropathy, Received Chemotherapy Photos Photo Uploaded By: [name] Ginger on 06/11/2018 13:29:49 Wound Measurements Length: (cm) 8 % Reduction Width: (cm) 10 % Reduction Depth: (cm) 0.1 Tunneling: Area: (cm) 62.832 Underminin Volume: (cm) 6.283 in Area: in Volume: No g: No Wound Description Classification: Partial Thickness Foul Odor Exudate Amount: Large Slough/Fib Exudate Type: Serosanguineous Exudate Color: red, brown After Cleansing: No rino Yes Wound Bed Granulation Amount: Large (67-100%) Exposed Structure Granulation Quality: Red Fascia Exposed: No Necrotic Amount: Small (1-33%) Fat Layer (Subcutaneous Tissue) Exposed: No Necrotic Quality: Adherent Slough Tendon Exposed: No Muscle Exposed: No Joint Exposed: No Bone Exposed: No Adam Merritt, Adam Merritt. ([id number]) Periwound Skin Texture Texture Color No Abnormalities Noted: No No Abnormalities Noted: No Callus: No Atrophie Blanche: No Crepitus: No Cyanosis: No Excoriation: No Ecchymosis: No Induration: No Erythema: No Rash: No Hemosiderin Staining: No Scarring: No Mottled: No Pallor: No Moisture Rubor: No No Abnormalities Noted: No Dry / Scaly: No Maceration: No Wound Preparation Ulcer Cleansing: Rinsed/Irrigated with Saline, Other: soap and water, Topical Anesthetic Applied: Other: lidocaine 4%, Treatment  Notes Wound #17 (Right, Lateral Lower Leg) Notes toe - prisma, foam and coverlet; others silvercel, xtrasorb and 3 layer wrap bilateral Electronic Signature(s) Signed: 06/11/2018 1:34:38 PM By: [name] Ginger Entered By: [name] Ginger on 06/11/2018 11:57:34 Adam Merritt ([id number]) -------------------------------------------------------------------------------- Vitals  Details Patient Name: Adam Merritt Date of Service: 06/11/2018 11:15 AM Medical Record Number: [id number] Patient Account Number: [id number] Date of Birth/Sex: 27-Sep-1945 (72 y.o. M) Treating RN: Montey Hora Primary Care Mithra Spano: [name] Other Clinician: Referring Milissa Fesperman: [name] Treating Keilany Burnette/Extender: Melburn Hake, [name]: 9 Vital Signs Time Taken: 11:29 Temperature (F): 98.4 Height (in): 66 Pulse (bpm): 95 Weight (lbs): 323 Respiratory Rate (breaths/min): 18 Body Mass Index (BMI): 52.1 Blood Pressure (mmHg): 156/87 Reference Range: 80 - 120 mg / dl Electronic Signature(s) Signed: 06/11/2018 1:34:38 PM By: [name] Ginger Entered BySecundino Ginger on 06/11/2018 11:32:27

## 2018-06-13 ENCOUNTER — Telehealth: Payer: Self-pay | Admitting: Family

## 2018-06-13 ENCOUNTER — Encounter: Payer: Self-pay | Admitting: Family

## 2018-06-13 ENCOUNTER — Ambulatory Visit: Payer: Medicare Other | Attending: Family | Admitting: Family

## 2018-06-13 VITALS — BP 115/58 | HR 74 | Resp 18 | Ht 66.0 in | Wt 304.2 lb

## 2018-06-13 DIAGNOSIS — E785 Hyperlipidemia, unspecified: Secondary | ICD-10-CM | POA: Diagnosis not present

## 2018-06-13 DIAGNOSIS — G4700 Insomnia, unspecified: Secondary | ICD-10-CM | POA: Insufficient documentation

## 2018-06-13 DIAGNOSIS — J449 Chronic obstructive pulmonary disease, unspecified: Secondary | ICD-10-CM | POA: Diagnosis not present

## 2018-06-13 DIAGNOSIS — Z7982 Long term (current) use of aspirin: Secondary | ICD-10-CM | POA: Diagnosis not present

## 2018-06-13 DIAGNOSIS — Z79899 Other long term (current) drug therapy: Secondary | ICD-10-CM | POA: Diagnosis not present

## 2018-06-13 DIAGNOSIS — Z951 Presence of aortocoronary bypass graft: Secondary | ICD-10-CM | POA: Diagnosis not present

## 2018-06-13 DIAGNOSIS — I110 Hypertensive heart disease with heart failure: Secondary | ICD-10-CM | POA: Diagnosis present

## 2018-06-13 DIAGNOSIS — I2510 Atherosclerotic heart disease of native coronary artery without angina pectoris: Secondary | ICD-10-CM | POA: Diagnosis not present

## 2018-06-13 DIAGNOSIS — I5032 Chronic diastolic (congestive) heart failure: Secondary | ICD-10-CM | POA: Diagnosis present

## 2018-06-13 DIAGNOSIS — IMO0001 Reserved for inherently not codable concepts without codable children: Secondary | ICD-10-CM

## 2018-06-13 DIAGNOSIS — I890 Lymphedema, not elsewhere classified: Secondary | ICD-10-CM

## 2018-06-13 DIAGNOSIS — E1140 Type 2 diabetes mellitus with diabetic neuropathy, unspecified: Secondary | ICD-10-CM | POA: Diagnosis not present

## 2018-06-13 DIAGNOSIS — Z833 Family history of diabetes mellitus: Secondary | ICD-10-CM | POA: Diagnosis not present

## 2018-06-13 DIAGNOSIS — Z8249 Family history of ischemic heart disease and other diseases of the circulatory system: Secondary | ICD-10-CM | POA: Diagnosis not present

## 2018-06-13 DIAGNOSIS — I10 Essential (primary) hypertension: Secondary | ICD-10-CM

## 2018-06-13 DIAGNOSIS — Z8673 Personal history of transient ischemic attack (TIA), and cerebral infarction without residual deficits: Secondary | ICD-10-CM | POA: Diagnosis not present

## 2018-06-13 DIAGNOSIS — K219 Gastro-esophageal reflux disease without esophagitis: Secondary | ICD-10-CM | POA: Insufficient documentation

## 2018-06-13 DIAGNOSIS — Z9049 Acquired absence of other specified parts of digestive tract: Secondary | ICD-10-CM | POA: Diagnosis not present

## 2018-06-13 DIAGNOSIS — Z794 Long term (current) use of insulin: Secondary | ICD-10-CM | POA: Diagnosis not present

## 2018-06-13 DIAGNOSIS — E119 Type 2 diabetes mellitus without complications: Secondary | ICD-10-CM

## 2018-06-13 LAB — BASIC METABOLIC PANEL
Anion gap: 12 (ref 5–15)
BUN: 26 mg/dL — AB (ref 8–23)
CO2: 27 mmol/L (ref 22–32)
Calcium: 8.7 mg/dL — ABNORMAL LOW (ref 8.9–10.3)
Chloride: 98 mmol/L (ref 98–111)
Creatinine, Ser: 1.88 mg/dL — ABNORMAL HIGH (ref 0.61–1.24)
GFR calc Af Amer: 40 mL/min — ABNORMAL LOW (ref >60)
GFR, EST NON AFRICAN AMERICAN: 35 mL/min — AB (ref >60)
Glucose, Bld: 226 mg/dL — ABNORMAL HIGH (ref 70–99)
POTASSIUM: 2.8 mmol/L — AB (ref 3.5–5.1)
SODIUM: 137 mmol/L (ref 135–145)

## 2018-06-13 MED ORDER — POTASSIUM CHLORIDE CRYS ER 20 MEQ PO TBCR: 40.0000 meq | EXTENDED_RELEASE_TABLET | Freq: Two times a day (BID) | ORAL | 3 refills | Status: DC

## 2018-06-13 NOTE — Progress Notes (Addendum)
CLIFFARD, HAIR ([id number]) Visit Report for 06/11/2018 Chief Complaint Document Details Patient Name: Adam Merritt, Adam Merritt Date of Service: 06/11/2018 11:15 AM Medical Record Number: [id number] Patient Account Number: [id number] Date of Birth/Sex: [birth year] (72 y.o. M) Treating RN: Montey Hora Primary Care Provider: [name] Other Clinician: Referring Provider: [name] Treating Provider/Extender: Melburn Hake, Placido Hangartner [name]: 9 Information Obtained from: Patient Chief Complaint he is here for evaluation of bilateral lower extremity lymphedema right right toe and left LE ulcers Electronic Signature(s) Signed: 06/12/2018 8:35:34 AM By: Worthy Keeler PA-C Entered By: Worthy Keeler on 06/11/2018 11:57:30 Adam Merritt ([id number]) -------------------------------------------------------------------------------- Debridement Details Patient Name: Adam Merritt Date of Service: 06/11/2018 11:15 AM Medical Record Number: [id number] Patient Account Number: [id number] Date of Birth/Sex: [birth year] (72 y.o. M) Treating RN: Montey Hora Primary Care Provider: [name] Other Clinician: Referring Provider: [name] Treating Provider/Extender: Melburn Hake, Jodie Cavey [name]: 9 Debridement Performed for Wound #14 Right Toe Great Assessment: Performed By: Physician STONE III, Press Casale E., PA-C Debridement Type: Debridement Level of Consciousness (Pre- Awake and Alert procedure): Pre-procedure Verification/Time Yes - 12:15 Out Taken: Start Time: 12:15 Pain Control: Lidocaine 4% Topical Solution Total Area Debrided (L x W): 0.9 (cm) x 1.4 (cm) = 1.26 (cm) Tissue and other material Viable, Non-Viable, Callus, Slough, Subcutaneous, Slough debrided: Level: Skin/Subcutaneous Tissue Debridement Description: Excisional Instrument: Curette Bleeding: Minimum Hemostasis Achieved: Pressure End Time: 12:19 Procedural Pain: 0 Post Procedural Pain: 0 Response to Treatment:  Procedure was tolerated well Level of Consciousness Awake and Alert (Post-procedure): Post Debridement Measurements of Total Wound Length: (cm) 0.9 Width: (cm) 1.4 Depth: (cm) 0.4 Volume: (cm) 0.396 Character of Wound/Ulcer Post Debridement: Improved Post Procedure Diagnosis Same as Pre-procedure Electronic Signature(s) Signed: 06/11/2018 4:56:07 PM By: Montey Hora Signed: 06/12/2018 8:35:34 AM By: Worthy Keeler PA-C Entered By: Montey Hora on 06/11/2018 12:20:31 Adam Merritt ([id number]) -------------------------------------------------------------------------------- HPI Details Patient Name: Adam Merritt Date of Service: 06/11/2018 11:15 AM Medical Record Number: [id number] Patient Account Number: [id number] Date of Birth/Sex: [birth year] (73 y.o. M) Treating RN: Montey Hora Primary Care Provider: [name] Other Clinician: Referring Provider: [name] Treating Provider/Extender: Melburn Hake, Shlomie Romig [name]: 9 History of Present Illness HPI Description: 10/18/17-He is here for initial evaluation of bilateral lower extremity ulcerations in the presence of venous insufficiency and lymphedema. He has been seen by vascular medicine in the past, Dr. Lucky Cowboy, last seen in 2016. He does have a history of abnormal ABIs, which is to be expected given his lymphedema and venous insufficiency. According to Epic, it appears that all attempts for arterial evaluation and/or angiography were not follow through with by patient. He does have a history of being seen in lymphedema clinic in 2018, stopped going approximately 6 months ago stating "it didn't do any good". He does not have lymphedema pumps, he does not have custom fit compression wrap/stockings. He is diabetic and his recent A1c last month was 7.6. He admits to chronic bilateral lower extremity pain, no change in pain since blister and ulceration development. He is currently being treated with Levaquin for bronchitis.  He has home health and we will continue. 10/25/17-He is here in follow-up evaluation for bilateral lower extremity ulcerationssubtle he remains on Levaquin for bronchitis. Right lower extremity with no evidence of drainage or ulceration, persistent left lower extremity ulceration. He states that home health has not been out since his appointment. He went to [facility] on Tuesday, studies revealed:  RIGHT ABI 0.9, TBI 0.6 LEFT ABI 1.1, TBI 0.6 with triphasic flow bilaterally. We will continue his same treatment plan. He has been educated on compression therapy and need for elevation. He will benefit from lymphedema pumps 11/01/17-He is here in follow-up evaluation for left lower extremity ulcer. The right lower extremity remains healed. He has home health services, but they have not been out to see the patient for 2-3 weeks. He states it home health physical therapy changed his dressing yesterday after therapy; he placed Ace wrap compression. We are still waiting for lymphedema pumps, reordered d/t need for company change. 11/08/17-He is here in follow-up evaluation for left lower extremity ulcer. It is improved. Edema is significantly improved with compression therapy. We will continue with same treatment plan and he will follow-up next week. No word regarding lymphedema pumps 11/15/17-He is here in follow-up evaluation for left lower extremity ulcer. He is healed and will be discharged from wound care services. I have reached out to [facility] regarding his lymphedema pumps. They have been unable to reach the patient; the contact number they had with the patient's wife's cell phone and she has not answered any unrecognized calls. Contact should be made today, trial planned for next week; [facility] will continue to follow 11/27/17 on evaluation today patient has multiple blistered areas over the right lower extremity his left lower extremity appears to be doing okay. These  blistered areas show signs of no infection which is great news. With that being said he did have some necrotic skin overlying which was mechanically debrided away with saline and gauze today without complication. Overall post debridement the wounds appear to be doing better but in general his swelling seems to be increased. This is obviously not good news. I think this is what has given rise to the blisters. 12/04/17 on evaluation today patient presents for follow-up concerning his bilateral lower extremity edema in the right lower extremity ulcers. He has been tolerating the dressing changes without complication. With that being said he has had no real issues with the wraps which is also good news. Overall I'm pleased with the progress he's been making. 12/11/17 on evaluation today patient appears to be doing rather well in regard to his right lateral lower extremity ulcer. He's been tolerating the dressing changes without complication. Fortunately there does not appear to be any evidence of infection at this time. Overall I'm pleased with the progress that is being made. Unfortunately he has been in the hospital due to having what sounds to be a stomach virus/flu fortunately that is starting to get better. 12/18/17 on evaluation today patient actually appears to be doing very well in regard to his bilateral lower extremities the swelling is under fairly good control his lymphedema pumps are still not up and running quite yet. With that being said he does have several areas of opening noted as far as wounds are concerned mainly over the left lower extremity. With that being said I do believe once he gets lymphedema pumps this would least help you mention some the fluid and preventing this from occurring. Hopefully that will be set up soon sleeves are Artie in place at his home he just waiting for the machine. 12/25/17 on evaluation today patient actually appears to be doing excellent in fact all of his  ulcers appear to have resolved his Adam Merritt, STTHOMAS. ([id number]) legs appear very well. I do think he needs compression stockings we have discussed this and they are actually  going to go to [place] today to elastic therapy to get this fitted for him. I think that is definitely a good thing to do. Readmission: 04/09/18 upon evaluation today this patient is seen for readmission due to bilateral lower extremity lymphedema. He has significant swelling of his extremities especially on the left although the right is also swollen he has weeping from both sides. There are no obvious open wounds at this point. Fortunately he has been doing fairly well for quite a bit of time since I last saw him. Nonetheless unfortunately this seems to have reopened and is giving quite a bit of trouble. He states this began about a week ago when he first called [place] to get in to be seen. No fevers, chills, nausea, or vomiting noted at this time. He has not been using his lymphedema pumps due to the fact that they won't fit on his leg at this point likewise is also not been using his compression for essentially the same reason. 04/16/18 upon evaluation today patient actually appears to be doing a little better in regard to the fluid in his bilateral lower extremities. With that being said he's had three falls since I saw him last week. He also states that he's been feeling very poorly. I was concerned last week and feel like that the concern is still there as far as the congestion in his chest is concerned he seems to be breathing about the same as last week but again he states he's very weak he's not even able to walk further than from the chair to the door. His wife had to buy a wheelchair just to be able to get them out of the house to get to the appointment today. This has me very concerned. 04/23/18 on evaluation today patient actually appears to be doing much better than last week's evaluation. At that time actually had  to transport him to the ER via EMS and he subsequently was admitted for acute pulmonary edema, acute renal failure, and acute congestive heart failure. Fortunately he is doing much better. Apparently they did dialyze him and were able to take off roughly 35 pounds of fluid. Nonetheless he is feeling much better both in regard to his breathing and he's able to get around much better at this time compared to previous. Overall I'm very happy with how things are at this time. There does not appear to be any evidence of infection currently. No fevers, chills, nausea, or vomiting noted at this time. 04/30/2018 patient seen today for follow-up and management of bilateral lower extremity lymphedema. He did express being more sad today than usual due to the recent loss of his dog. He states that he has been compliant with using the lymphedema pumps. However he does admit a minute over the last 2-3 days he has not been using the pumps due to the recent loss of his dog. At this time there is no drainage or open wounds to his lower extremities. The left leg edema is measuring smaller today. Still has a significant amount of edema on bilateral lower extremities With dry flaky skin. He will be referred to the lymphedema clinic for further management. Will continue 3 layer compression wraps and follow-up in 1-2 weeks.Denies any pain, fever, chairs recently. No recent falls or injuries reported during this visit. 05/07/18 on evaluation today patient actually appears to be doing very well in regard to his lower extremities in general all things considered. With that being said he is having  some pain in the legs just due to the amount of swelling. He does have an area where he had a blister on the left lateral lower extremity this is open at this point other than that there's nothing else weeping at this time. 05/14/18 on evaluation today patient actually appears to be doing excellent all things considered in regard to  his lower extremities. He still has a couple areas of weeping on each leg which has continued to be the issue for him. He does have an appointment with the lymphedema clinic although this isn't until February 2020. That was the earliest they had. In the meantime he has continued to tolerate the compression wraps without complication. 05/28/18 on evaluation today patient actually appears to be doing more poorly in regard to his left lower extremity where he has a wound open at this point. He also had a fall where he subsequently injured his right great toe which has led to an open wound at the site unfortunately. He has been tolerating the dressing changes without complication in general as far as the wraps are concerned that he has not been putting any dressing on the left 1st toe ulcer site. 06/11/18 on evaluation today patient appears to be doing much worse in regard to his bilateral lower extremity ulcers. He has been tolerating the dressing changes without complication although his legs have not been wrapped more recently. Overall I am not very pleased with the way his legs appear. I do believe he needs to be back in compression wraps he still has not received his compression wraps from the [facility] hospital as of yet. Electronic Signature(s) Signed: 06/12/2018 8:35:34 AM By: Worthy Keeler PA-C Entered By: Worthy Keeler on 06/11/2018 17:24:42 Adam Merritt, Adam Merritt ([id number]) Adam Merritt, Adam Merritt ([id number]) -------------------------------------------------------------------------------- Physical Exam Details Patient Name: Adam Merritt Date of Service: 06/11/2018 11:15 AM Medical Record Number: [id number] Patient Account Number: [id number] Date of Birth/Sex: 1945/06/11 (72 y.o. M) Treating RN: Montey Hora Primary Care Provider: [name] Other Clinician: Referring Provider: [name] Treating Provider/Extender: STONE III, Laya Letendre [name]: 9 Constitutional Well-nourished and  well-hydrated in no acute distress. Respiratory normal breathing without difficulty. clear to auscultation bilaterally. Cardiovascular regular rate and rhythm with normal S1, S2. Bilateral stage 3 lymphedema. Psychiatric this patient is able to make decisions and demonstrates good insight into disease process. Alert and Oriented x 3. pleasant and cooperative. Notes Patient's wound bed currently shows signs of fairly good granulation there does not appear to be significant slough buildup which is good news. Nonetheless the areas of opening bilaterally are much larger than what he's had in the recent past. No fevers, chills, nausea, or vomiting noted at this time. No sharp debridement performed at this point. Electronic Signature(s) Signed: 06/12/2018 8:35:34 AM By: Worthy Keeler PA-C Entered By: Worthy Keeler on 06/11/2018 17:25:30 Adam Merritt ([id number]) -------------------------------------------------------------------------------- Physician Orders Details Patient Name: Adam Merritt Date of Service: 06/11/2018 11:15 AM Medical Record Number: [id number] Patient Account Number: [id number] Date of Birth/Sex: 02-18-1946 (72 y.o. 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 o Cleanse wound with mild soap and water Wound #15 Left Lower Leg o Clean wound with Normal Saline. o Cleanse wound with mild soap and water Wound #16 Lateral Lower  Leg o Clean wound with Normal Saline. o Cleanse wound with mild soap and water Wound #17 Right,Lateral Lower Leg o Clean wound with Normal Saline. o Cleanse wound with mild soap and water Skin Barriers/Peri-Wound Care Wound #14 Right Toe Great o Moisturizing lotion - to both lower legs Wound #15 Left Lower Leg o Moisturizing lotion - to both lower legs Wound #16 Lateral Lower Leg o Moisturizing lotion - to both lower legs Wound #17 Right,Lateral Lower Leg o Moisturizing lotion - to both lower legs Primary Wound Dressing Wound #14 Right Toe Great o Silver Collagen Wound #15 Left Lower Leg o Silver Alginate Adam Merritt, Adam Merritt ([id number]) Wound #16 Lateral Lower Leg o Silver Alginate Wound #17 Right,Lateral Lower Leg o Silver Alginate Secondary Dressing Wound #14 Right Toe Great o Gauze and Kerlix/Conform Wound #15 Left Lower Leg o ABD pad o XtraSorb Wound #16 Lateral Lower Leg o ABD pad o XtraSorb Wound #17 Right,Lateral Lower Leg o ABD pad o XtraSorb Dressing Change Frequency Wound #14 Right Toe Great o Change Dressing Monday, Wednesday, Friday Wound #15 Left Lower Leg o Change Dressing Monday, Wednesday, Friday Wound #16 Lateral Lower Leg o Change Dressing Monday, Wednesday, Friday Wound #17 Right,Lateral Lower Leg o Change Dressing Monday, Wednesday, Friday Follow-up Appointments Wound #14 Right Toe Great o Return Appointment in 1 week. Wound #15 Left Lower Leg o Return Appointment in 1 week. Wound #16 Lateral Lower Leg o Return Appointment in 1 week. Wound #17 Right,Lateral Lower Leg o Return Appointment in 1 week. Edema Control Wound #15 Left Lower Leg o 3 Layer Compression System - Bilateral Wound #16 Lateral Lower Leg o 3 Layer Compression System - Bilateral Adam Merritt, Adam Merritt ([id number]) Wound #17 Right,Lateral Lower Leg o 3 Layer Compression System - Bilateral Home Health Wound #14 Right Toe  [place] Nurse may visit PRN to address patientos wound care needs. o FACE TO FACE ENCOUNTER: MEDICARE and MEDICAID PATIENTS: I certify that this patient is under my care and that I had a face-to-face encounter that meets the physician face-to-face encounter requirements with this patient on this date. The encounter with the patient was in whole or in part for the following MEDICAL CONDITION: (primary reason for [place]) MEDICAL NECESSITY: I certify, that based on my findings, NURSING services are a medically necessary home health service. HOME BOUND STATUS: I certify that my clinical findings support that this patient is homebound (i.e., Due to illness or injury, pt requires aid of supportive devices such as crutches, cane, wheelchairs, walkers, the use of special transportation or the assistance of another person to leave their place of residence. There is a normal inability to leave the home and doing so requires considerable and taxing effort. Other absences are for medical reasons / religious services and are infrequent or of short duration when for other reasons). o If current dressing causes regression in wound condition, may D/C ordered dressing product/s and apply Normal Saline Moist Dressing daily until next [place] / Other MD appointment. [place] of regression in wound condition at [phone]. o Please direct any NON-WOUND related issues/requests for orders to patient's Primary Care Physician Wound #15 Left Lower Leg o [place]  Visits o [place] Nurse may visit PRN to address patientos wound care needs. o FACE TO FACE ENCOUNTER: MEDICARE and MEDICAID PATIENTS: I certify that this patient is under my care and that I had a face-to-face encounter that meets the physician face-to-face encounter requirements with this patient on this date. The encounter with the patient was  in whole or in part for the following MEDICAL CONDITION: (primary reason for [place]) MEDICAL NECESSITY: I certify, that based on my findings, NURSING services are a medically necessary home health service. HOME BOUND STATUS: I certify that my clinical findings support that this patient is homebound (i.e., Due to illness or injury, pt requires aid of supportive devices such as crutches, cane, wheelchairs, walkers, the use of special transportation or the assistance of another person to leave their place of residence. There is a normal inability to leave the home and doing so requires considerable and taxing effort. Other absences are for medical reasons / religious services and are infrequent or of short duration when for other reasons). o If current dressing causes regression in wound condition, may D/C ordered dressing product/s and apply Normal Saline Moist Dressing daily until next [place] / Other MD appointment. [place] of regression in wound condition at [phone]. o Please direct any NON-WOUND related issues/requests for orders to patient's Primary Care Physician Wound #16 Lateral Lower Leg o [place] Nurse may visit PRN to address patientos wound care needs. o FACE TO FACE ENCOUNTER: MEDICARE and MEDICAID PATIENTS: I certify that this patient is under my care and that I had a face-to-face encounter that meets the physician face-to-face encounter requirements with this patient on this date. The encounter with the patient was in whole or in part for the following MEDICAL CONDITION: (primary reason for [place]) MEDICAL NECESSITY: I certify, that based on my findings, NURSING services are a medically necessary home health service. HOME BOUND STATUS: I certify that my clinical findings support that this patient is homebound (i.e., Due to illness or injury, pt requires aid of supportive devices such  as crutches, cane, wheelchairs, walkers, the use of special transportation or the assistance of another person to leave their place of residence. There is a normal inability to leave the home and doing so requires considerable and taxing effort. Other absences are for medical reasons / religious services and are infrequent or of short duration when for other reasons). o If current dressing causes regression in wound condition, may D/C ordered dressing product/s and apply Normal Saline Moist Dressing daily until next [place] / Other MD appointment. [place] of regression in wound condition at [phone]. o Please direct any NON-WOUND related issues/requests for orders to patient's Primary Care Physician CAM, DAUPHIN ([id number]) Wound #17 [place] Nurse may visit PRN to address patientos wound care needs. o FACE TO FACE ENCOUNTER: MEDICARE and MEDICAID PATIENTS: I certify that this patient is under my care and that I had a face-to-face encounter that meets the physician face-to-face encounter requirements with this patient on this date. The encounter with the patient was in whole or in part for the following MEDICAL CONDITION: (primary reason for [place]) MEDICAL NECESSITY: I certify, that based on my findings, NURSING services are a medically necessary home health service. HOME BOUND STATUS: I certify that my clinical findings support that this patient is homebound (i.e., Due to illness  or injury, pt requires aid of supportive devices such as crutches, cane, wheelchairs, walkers, the use of special transportation or the assistance of another person to leave their place of residence. There is a normal inability to leave the home and doing so requires considerable and taxing effort. Other absences are for medical reasons / religious services and are infrequent or of short duration when  for other reasons). o If current dressing causes regression in wound condition, may D/C ordered dressing product/s and apply Normal Saline Moist Dressing daily until next [place] / Other MD appointment. [place] of regression in wound condition at [phone]. o Please direct any NON-WOUND related issues/requests for orders to patient's Primary Care Physician Electronic Signature(s) Signed: 06/11/2018 4:56:07 PM By: Montey Hora Signed: 06/12/2018 8:35:34 AM By: Worthy Keeler PA-C Entered By: Montey Hora on 06/11/2018 12:23:10 Adam Merritt ([id number]) -------------------------------------------------------------------------------- Problem List Details Patient Name: Adam Merritt Date of Service: 06/11/2018 11:15 AM Medical Record Number: [id number] Patient Account Number: [id number] Date of Birth/Sex: 02/10/1946 (73 y.o. 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 06/11/2018 No Yes exposed E66.9 Obesity, unspecified 04/09/2018 No Yes Inactive Problems Resolved Problems ICD-10 Code Description Active Date Resolved Date L03.115 Cellulitis of right lower limb 04/09/2018 04/09/2018 L03.116 Cellulitis of left lower limb 04/09/2018 04/09/2018 Electronic Signature(s) Signed: 06/12/2018 8:35:34 AM By: Worthy Keeler PA-C Entered By: Worthy Keeler on 06/11/2018 17:26:43 Adam Merritt  ([id number]) MELECIO, CUETO ([id number]) -------------------------------------------------------------------------------- Progress Note Details Patient Name: Adam Merritt Date of Service: 06/11/2018 11:15 AM Medical Record Number: [id number] Patient Account Number: [id number] Date of Birth/Sex: 10-09-1945 (72 y.o. M) Treating RN: Montey Hora Primary Care Provider: [name] Other Clinician: Referring Provider: [name] Treating Provider/Extender: Melburn Hake, Reuel Lamadrid [name]: 9 Subjective Chief Complaint Information obtained from Patient he is here for evaluation of bilateral lower extremity lymphedema right right toe and left LE ulcers History of Present Illness (HPI) 10/18/17-He is here for initial evaluation of bilateral lower extremity ulcerations in the presence of venous insufficiency and lymphedema. He has been seen by vascular medicine in the past, Dr. Lucky Cowboy, last seen in 2016. He does have a history of abnormal ABIs, which is to be expected given his lymphedema and venous insufficiency. According to Epic, it appears that all attempts for arterial evaluation and/or angiography were not follow through with by patient. He does have a history of being seen in lymphedema clinic in 2018, stopped going approximately 6 months ago stating "it didn't do any good". He does not have lymphedema pumps, he does not have custom fit compression wrap/stockings. He is diabetic and his recent A1c last month was 7.6. He admits to chronic bilateral lower extremity pain, no change in pain since blister and ulceration development. He is currently being treated with Levaquin for bronchitis. He has home health and we will continue. 10/25/17-He is here in follow-up evaluation for bilateral lower extremity ulcerationssubtle he remains on Levaquin for bronchitis. Right lower extremity with  no evidence of drainage or ulceration, persistent left lower extremity ulceration. He states that home health  has not been out since his appointment. He went to [facility] on Tuesday, studies revealed: RIGHT ABI 0.9, TBI 0.6 LEFT ABI 1.1, TBI 0.6 with triphasic flow bilaterally. We will continue his same treatment plan. He has been educated on compression therapy and need for elevation. He will benefit from lymphedema pumps 11/01/17-He is here in follow-up evaluation for left lower extremity ulcer. The right lower extremity remains healed. He has home health services, but they have not been out to see the patient for 2-3 weeks. He states it home health physical therapy changed his dressing yesterday after therapy; he placed Ace wrap compression. We are still waiting for lymphedema pumps, reordered d/t need for company change. 11/08/17-He is here in follow-up evaluation for left lower extremity ulcer. It is improved. Edema is significantly improved with compression therapy. We will continue with same treatment plan and he will follow-up next week. No word regarding lymphedema pumps 11/15/17-He is here in follow-up evaluation for left lower extremity ulcer. He is healed and will be discharged from wound care services. I have reached out to [facility] regarding his lymphedema pumps. They have been unable to reach the patient; the contact number they had with the patient's wife's cell phone and she has not answered any unrecognized calls. Contact should be made today, trial planned for next week; [facility] will continue to follow 11/27/17 on evaluation today patient has multiple blistered areas over the right lower extremity his left lower extremity appears to be doing okay. These blistered areas show signs of no infection which is great news. With that being said he did have some necrotic skin overlying which was mechanically debrided away with saline and gauze today without complication. Overall post debridement the wounds appear to be doing better but in general his swelling seems  to be increased. This is obviously not good news. I think this is what has given rise to the blisters. 12/04/17 on evaluation today patient presents for follow-up concerning his bilateral lower extremity edema in the right lower extremity ulcers. He has been tolerating the dressing changes without complication. With that being said he has had no real issues with the wraps which is also good news. Overall I'm pleased with the progress he's been making. 12/11/17 on evaluation today patient appears to be doing rather well in regard to his right lateral lower extremity ulcer. He's been tolerating the dressing changes without complication. Fortunately there does not appear to be any evidence of infection at this time. Overall I'm pleased with the progress that is being made. Unfortunately he has been in the hospital due to having what sounds to be a stomach virus/flu fortunately that is starting to get better. 12/18/17 on evaluation today patient actually appears to be doing very well in regard to his bilateral lower extremities the Adam Merritt, Adam Merritt. ([id number]) swelling is under fairly good control his lymphedema pumps are still not up and running quite yet. With that being said he does have several areas of opening noted as far as wounds are concerned mainly over the left lower extremity. With that being said I do believe once he gets lymphedema pumps this would least help you mention some the fluid and preventing this from occurring. Hopefully that will be set up soon sleeves are Artie in place at his home he just waiting for the machine. 12/25/17 on evaluation today patient actually appears  to be doing excellent in fact all of his ulcers appear to have resolved his legs appear very well. I do think he needs compression stockings we have discussed this and they are actually going to go to [place] today to elastic therapy to get this fitted for him. I think that is definitely a good thing to  do. Readmission: 04/09/18 upon evaluation today this patient is seen for readmission due to bilateral lower extremity lymphedema. He has significant swelling of his extremities especially on the left although the right is also swollen he has weeping from both sides. There are no obvious open wounds at this point. Fortunately he has been doing fairly well for quite a bit of time since I last saw him. Nonetheless unfortunately this seems to have reopened and is giving quite a bit of trouble. He states this began about a week ago when he first called [place] to get in to be seen. No fevers, chills, nausea, or vomiting noted at this time. He has not been using his lymphedema pumps due to the fact that they won't fit on his leg at this point likewise is also not been using his compression for essentially the same reason. 04/16/18 upon evaluation today patient actually appears to be doing a little better in regard to the fluid in his bilateral lower extremities. With that being said he's had three falls since I saw him last week. He also states that he's been feeling very poorly. I was concerned last week and feel like that the concern is still there as far as the congestion in his chest is concerned he seems to be breathing about the same as last week but again he states he's very weak he's not even able to walk further than from the chair to the door. His wife had to buy a wheelchair just to be able to get them out of the house to get to the appointment today. This has me very concerned. 04/23/18 on evaluation today patient actually appears to be doing much better than last week's evaluation. At that time actually had to transport him to the ER via EMS and he subsequently was admitted for acute pulmonary edema, acute renal failure, and acute congestive heart failure. Fortunately he is doing much better. Apparently they did dialyze him and were able to take off roughly 35 pounds of fluid. Nonetheless he is  feeling much better both in regard to his breathing and he's able to get around much better at this time compared to previous. Overall I'm very happy with how things are at this time. There does not appear to be any evidence of infection currently. No fevers, chills, nausea, or vomiting noted at this time. 04/30/2018 patient seen today for follow-up and management of bilateral lower extremity lymphedema. He did express being more sad today than usual due to the recent loss of his dog. He states that he has been compliant with using the lymphedema pumps. However he does admit a minute over the last 2-3 days he has not been using the pumps due to the recent loss of his dog. At this time there is no drainage or open wounds to his lower extremities. The left leg edema is measuring smaller today. Still has a significant amount of edema on bilateral lower extremities With dry flaky skin. He will be referred to the lymphedema clinic for further management. Will continue 3 layer compression wraps and follow-up in 1-2 weeks.Denies any pain, fever, chairs recently. No recent falls or  injuries reported during this visit. 05/07/18 on evaluation today patient actually appears to be doing very well in regard to his lower extremities in general all things considered. With that being said he is having some pain in the legs just due to the amount of swelling. He does have an area where he had a blister on the left lateral lower extremity this is open at this point other than that there's nothing else weeping at this time. 05/14/18 on evaluation today patient actually appears to be doing excellent all things considered in regard to his lower extremities. He still has a couple areas of weeping on each leg which has continued to be the issue for him. He does have an appointment with the lymphedema clinic although this isn't until February 2020. That was the earliest they had. In the meantime he has continued to tolerate  the compression wraps without complication. 05/28/18 on evaluation today patient actually appears to be doing more poorly in regard to his left lower extremity where he has a wound open at this point. He also had a fall where he subsequently injured his right great toe which has led to an open wound at the site unfortunately. He has been tolerating the dressing changes without complication in general as far as the wraps are concerned that he has not been putting any dressing on the left 1st toe ulcer site. 06/11/18 on evaluation today patient appears to be doing much worse in regard to his bilateral lower extremity ulcers. He has been tolerating the dressing changes without complication although his legs have not been wrapped more recently. Overall I am not very pleased with the way his legs appear. I do believe he needs to be back in compression wraps he still has not received his compression wraps from the [facility] as of yet. Adam Merritt, Adam Merritt ([id number]) Patient History Information obtained from Patient. Family History Cancer - Paternal Grandparents, Kidney Disease - Siblings, Lung Disease - Mother, No family history of Diabetes, Heart Disease, Hereditary Spherocytosis, Hypertension, Seizures, Stroke, Thyroid Problems, Tuberculosis. Social History Never smoker, Marital Status - Married, Alcohol Use - Never, Drug Use - No History, Caffeine Use - Never. Medical History Hospitalization/Surgery History - 11/03/2013, [facility], bronchitis. Medical And Surgical History Notes Respiratory home O2 at night as needed Cardiovascular varicose veins Neurologic CVA - 1992 Review of Systems (ROS) Constitutional Symptoms (General Health) Denies complaints or symptoms of Fever, Chills. Respiratory The patient has no complaints or symptoms. Cardiovascular Complains or has symptoms of LE edema. Psychiatric The patient has no complaints or symptoms. Objective Constitutional Well-nourished and  well-hydrated in no acute distress. Vitals Time Taken: 11:29 AM, Height: 66 in, Weight: 323 lbs, BMI: 52.1, Temperature: 98.4 F, Pulse: 95 bpm, Respiratory Rate: 18 breaths/min, Blood Pressure: 156/87 mmHg. Respiratory normal breathing without difficulty. clear to auscultation bilaterally. Cardiovascular regular rate and rhythm with normal S1, S2. Bilateral stage 3 lymphedema. Psychiatric this patient is able to make decisions and demonstrates good insight into disease process. Alert and Oriented x 3. pleasant Adam Merritt, Adam Merritt ([id number]) and cooperative. General Notes: Patient's wound bed currently shows signs of fairly good granulation there does not appear to be significant slough buildup which is good news. Nonetheless the areas of opening bilaterally are much larger than what he's had in the recent past. No fevers, chills, nausea, or vomiting noted at this time. No sharp debridement performed at this point. Integumentary (Hair, Skin) Wound #14 status is Open. Original cause of wound was Trauma. The  wound is located on the Right Toe Great. The wound measures 0.9cm length x 1.4cm width x 0.2cm depth; 0.99cm^2 area and 0.198cm^3 volume. There is Fat Layer (Subcutaneous Tissue) Exposed exposed. There is no tunneling noted, however, there is undermining starting at 5:00 and ending at 8:00 with a maximum distance of 0.2cm. There is a small amount of serous drainage noted. The wound margin is flat and intact. There is no granulation within the wound bed. There is no necrotic tissue within the wound bed. The periwound skin appearance exhibited: Excoriation. The periwound skin appearance did not exhibit: Callus, Crepitus, Induration, Rash, Scarring, Dry/Scaly, Maceration, Atrophie Blanche, Cyanosis, Ecchymosis, Hemosiderin Staining, Mottled, Pallor, Rubor, Erythema. Periwound temperature was noted as No Abnormality. The periwound has tenderness on palpation. Wound #15 status is Open. Original  cause of wound was Trauma. The wound is located on the Left Lower Leg. The wound measures 5.4cm length x 32cm width x 0.1cm depth; 135.717cm^2 area and 13.572cm^3 volume. There is Fat Layer (Subcutaneous Tissue) Exposed exposed. There is no tunneling or undermining noted. There is a large amount of serosanguineous drainage noted. The wound margin is flat and intact. There is large (67-100%) red, hyper - granulation within the wound bed. There is a small (1-33%) amount of necrotic tissue within the wound bed including Adherent Slough. The periwound skin appearance did not exhibit: Callus, Crepitus, Excoriation, Induration, Rash, Scarring, Dry/Scaly, Maceration, Atrophie Blanche, Cyanosis, Ecchymosis, Hemosiderin Staining, Mottled, Pallor, Rubor, Erythema. Wound #16 status is Open. Original cause of wound was Blister. The wound is located on the Left,Lateral Lower Leg. The wound measures 3cm length x 6cm width x 0.1cm depth; 14.137cm^2 area and 1.414cm^3 volume. There is no tunneling or undermining noted. There is a large amount of serosanguineous drainage noted. There is large (67-100%) red granulation within the wound bed. There is a small (1-33%) amount of necrotic tissue within the wound bed including Adherent Slough. The periwound skin appearance did not exhibit: Callus, Crepitus, Excoriation, Induration, Rash, Scarring, Dry/Scaly, Maceration, Atrophie Blanche, Cyanosis, Ecchymosis, Hemosiderin Staining, Mottled, Pallor, Rubor, Erythema. Wound #17 status is Open. Original cause of wound was Blister. The wound is located on the Right,Lateral Lower Leg. The wound measures 8cm length x 10cm width x 0.1cm depth; 62.832cm^2 area and 6.283cm^3 volume. There is no tunneling or undermining noted. There is a large amount of serosanguineous drainage noted. There is large (67-100%) red granulation within the wound bed. There is a small (1-33%) amount of necrotic tissue within the wound bed including Adherent  Slough. The periwound skin appearance did not exhibit: Callus, Crepitus, Excoriation, Induration, Rash, Scarring, Dry/Scaly, Maceration, Atrophie Blanche, Cyanosis, Ecchymosis, Hemosiderin Staining, Mottled, Pallor, Rubor, Erythema. 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 There was a Excisional Skin/Subcutaneous Tissue Debridement with a total area of 1.26 sq cm performed by STONE III, Niquan Charnley E., PA-C. With the following instrument(s): Curette to remove Viable and Non-Viable tissue/material. Material removed includes Callus, Subcutaneous Tissue, and Slough after achieving pain control using Lidocaine 4% Topical Solution. No specimens were taken. A time out was conducted at 12:15, prior to the start of the procedure. A Minimum  amount of bleeding was controlled with Pressure. The procedure was tolerated well with a pain level of 0 throughout and a pain level of 0 following the procedure. Post Debridement Measurements: 0.9cm length x 1.4cm width x 0.4cm depth; 0.396cm^3 volume. Character of Wound/Ulcer Post Debridement is improved. Post procedure Diagnosis Wound #14: Same as Pre-Procedure Plan Wound Cleansing: Wound #14 Right Toe Great: Clean wound with Normal Saline. Cleanse wound with mild soap and water Wound #15 Left Lower Leg: Clean wound with Normal Saline. Cleanse wound with mild soap and water Wound #16 Lateral Lower Leg: Clean wound with Normal Saline. Cleanse wound with mild soap and water Wound #17 Right,Lateral Lower Leg: Clean wound with Normal  Saline. Cleanse wound with mild soap and water Skin Barriers/Peri-Wound Care: Wound #14 Right Toe Great: Moisturizing lotion - to both lower legs Wound #15 Left Lower Leg: Moisturizing lotion - to both lower legs Wound #16 Lateral Lower Leg: Moisturizing lotion - to both lower legs Wound #17 Right,Lateral Lower Leg: Moisturizing lotion - to both lower legs Primary Wound Dressing: Wound #14 Right Toe Great: Silver Collagen Wound #15 Left Lower Leg: Silver Alginate Wound #16 Lateral Lower Leg: Silver Alginate Wound #17 Right,Lateral Lower Leg: Silver Alginate Secondary Dressing: Wound #14 Right Toe Great: Gauze and Kerlix/Conform Wound #15 Left Lower Leg: ABD pad Adam Merritt, Adam Merritt ([id number]) Wound #16 Lateral Lower Leg: ABD pad XtraSorb Wound #17 Right,Lateral Lower Leg: ABD pad XtraSorb Dressing Change Frequency: Wound #14 Right Toe Great: Change Dressing Monday, Wednesday, Friday Wound #15 Left Lower Leg: Change Dressing Monday, Wednesday, Friday Wound #16 Lateral Lower Leg: Change Dressing Monday, Wednesday, Friday Wound #17 Right,Lateral Lower Leg: Change Dressing Monday, Wednesday, Friday Follow-up Appointments: Wound #14 Right Toe Great: Return Appointment in 1 week. Wound #15 Left Lower Leg: Return Appointment in 1 week. Wound #16 Lateral Lower Leg: Return Appointment in 1 week. Wound #17 Right,Lateral Lower Leg: Return Appointment in 1 week. Edema Control: Wound #15 Left Lower Leg: 3 Layer Compression System - Bilateral Wound #16 Lateral Lower Leg: 3 Layer Compression System - Bilateral Wound #17 Right,Lateral Lower Leg: 3 Layer Compression System - Bilateral Home Health: Wound #14 Right Toe Great: [place] Nurse may visit PRN to address patient s wound care needs. FACE TO FACE ENCOUNTER: MEDICARE and MEDICAID PATIENTS: I certify that this patient is under my care and that I had a face-to-face encounter that  meets the physician face-to-face encounter requirements with this patient on this date. The encounter with the patient was in whole or in part for the following MEDICAL CONDITION: (primary reason for [place]) MEDICAL NECESSITY: I certify, that based on my findings, NURSING services are a medically necessary home health service. HOME BOUND STATUS: I certify that my clinical findings support that this patient is homebound (i.e., Due to illness or injury, pt requires aid of supportive devices such as crutches, cane, wheelchairs, walkers, the use of special transportation or the assistance of another person to leave their place of residence. There is a normal inability to leave the home and doing so requires considerable and taxing effort. Other absences are for medical reasons / religious services and are infrequent or of short duration when for other reasons). If current dressing causes regression in wound condition, may D/C ordered dressing product/s and apply Normal Saline Moist Dressing daily until next [place] / Other MD appointment. [place] of regression in wound condition at [phone]. Please direct any NON-WOUND  related issues/requests for orders to patient's Primary Care Physician Wound #15 Left Lower Leg: [place] Nurse may visit PRN to address patient s wound care needs. FACE TO FACE ENCOUNTER: MEDICARE and MEDICAID PATIENTS: I certify that this patient is under my care and that I had a face-to-face encounter that meets the physician face-to-face encounter requirements with this patient on this date. The encounter with the patient was in whole or in part for the following MEDICAL CONDITION: (primary reason for [place]) MEDICAL NECESSITY: I certify, that based on my findings, NURSING services are a medically necessary home health service. HOME BOUND STATUS: I certify that my clinical findings support that  this patient is homebound (i.e., Due to illness or injury, pt requires aid of supportive devices such as crutches, cane, wheelchairs, walkers, the use of special transportation or the assistance of another person to leave their place of residence. There is a normal inability to leave the home and doing so requires considerable and taxing effort. Other absences are for medical reasons / religious services and are infrequent or of short duration when for other reasons). COSTON, MANDATO ([id number]) If current dressing causes regression in wound condition, may D/C ordered dressing product/s and apply Normal Saline Moist Dressing daily until next [place] / Other MD appointment. [place] of regression in wound condition at [phone]. Please direct any NON-WOUND related issues/requests for orders to patient's Primary Care Physician Wound #16 Lateral Lower Leg: [place] Nurse may visit PRN to address patient s wound care needs. FACE TO FACE ENCOUNTER: MEDICARE and MEDICAID PATIENTS: I certify that this patient is under my care and that I had a face-to-face encounter that meets the physician face-to-face encounter requirements with this patient on this date. The encounter with the patient was in whole or in part for the following MEDICAL CONDITION: (primary reason for [place]) MEDICAL NECESSITY: I certify, that based on my findings, NURSING services are a medically necessary home health service. HOME BOUND STATUS: I certify that my clinical findings support that this patient is homebound (i.e., Due to illness or injury, pt requires aid of supportive devices such as crutches, cane, wheelchairs, walkers, the use of special transportation or the assistance of another person to leave their place of residence. There is a normal inability to leave the home and doing so requires considerable and taxing effort. Other absences are for  medical reasons / religious services and are infrequent or of short duration when for other reasons). If current dressing causes regression in wound condition, may D/C ordered dressing product/s and apply Normal Saline Moist Dressing daily until next [place] / Other MD appointment. [place] of regression in wound condition at [phone]. Please direct any NON-WOUND related issues/requests for orders to patient's Primary Care Physician Wound #17 Right,Lateral Lower Leg: [place] Nurse may visit PRN to address patient s wound care needs. FACE TO FACE ENCOUNTER: MEDICARE and MEDICAID PATIENTS: I certify that this patient is under my care and that I had a face-to-face encounter that meets the physician face-to-face encounter requirements with this patient on this date. The encounter with the patient was in whole or in part for the following MEDICAL CONDITION: (primary reason for [place]) MEDICAL NECESSITY: I certify, that based on my findings, NURSING services are a medically necessary home health service. HOME BOUND STATUS: I certify that my clinical findings support that  this patient is homebound (i.e., Due to illness or injury, pt requires aid of supportive devices such as crutches, cane, wheelchairs, walkers, the use of special transportation or the assistance of another person to leave their place of residence. There is a normal inability to leave the home and doing so requires considerable and taxing effort. Other absences are for medical reasons / religious services and are infrequent or of short duration when for other reasons). If current dressing causes regression in wound condition, may D/C ordered dressing product/s and apply Normal Saline Moist Dressing daily until next [place] / Other MD appointment. [place] of regression in wound condition at [phone]. Please direct any  NON-WOUND related issues/requests for orders to patient's Primary Care Physician I'm going to suggest currently that we initiate the above wound care measures the patient is in agreement with plan. We will subsequently see were things stand at follow-up. Please see above for specific wound care orders. We will see patient for re-evaluation in 1 week(s) here in the clinic. If anything worsens or changes patient will contact our office for additional recommendations. Electronic Signature(s) Signed: 06/23/2018 1:38:17 AM By: Worthy Keeler PA-C Previous Signature: 06/12/2018 8:35:34 AM Version By: Worthy Keeler PA-C Entered By: Worthy Keeler on 06/21/2018 09:07:38 Adam Merritt ([id number]) -------------------------------------------------------------------------------- ROS/PFSH Details Patient Name: Adam Merritt Date of Service: 06/11/2018 11:15 AM Medical Record Number: [id number] Patient Account Number: [id number] Date of Birth/Sex: 1946/01/02 (72 y.o. M) Treating RN: Montey Hora Primary Care Provider: [name] Other Clinician: Referring Provider: [name] Treating Provider/Extender: Melburn Hake, Keyerra Lamere [name]: 9 Information Obtained From Patient Wound History Do you currently have one or more open woundso No Have you tested positive for osteomyelitis (bone infection)o No Have you had any tests for circulation on your legso Yes Constitutional Symptoms (General Health) Complaints and Symptoms: Negative for: Fever; Chills Cardiovascular Complaints and Symptoms: Positive for: LE edema Medical History: Positive for: Congestive Heart Failure; Coronary Artery Disease; Hypertension; Peripheral Venous Disease Negative for: Angina; Arrhythmia; Deep Vein Thrombosis; Hypotension; Myocardial Infarction; Peripheral Arterial Disease; Phlebitis; Vasculitis Past Medical History Notes: varicose veins Eyes Medical History: Negative for: Cataracts; Glaucoma; Optic  Neuritis Ear/Nose/Mouth/Throat Medical History: Negative for: Chronic sinus problems/congestion; Middle ear problems Hematologic/Lymphatic Medical History: Positive for: Anemia - aplastic anemia; Lymphedema Negative for: Hemophilia; Human Immunodeficiency Virus; Sickle Cell Disease Respiratory Complaints and Symptoms: No Complaints or Symptoms Medical History: Positive for: Chronic Obstructive Pulmonary Disease (COPD); Sleep Apnea Negative for: Aspiration; Asthma; Pneumothorax; Tuberculosis Past Medical History Notes: GIORDAN, FORDHAM ([id number]) home O2 at night as needed Gastrointestinal Medical History: Negative for: Cirrhosis ; Colitis; Crohnos; Hepatitis A; Hepatitis B; Hepatitis C Endocrine Medical History: Positive for: Type II Diabetes Negative for: Type I Diabetes Time with diabetes: since 2006 Treated with: Insulin Blood sugar tested every day: Yes Tested : Blood sugar testing results: Breakfast: 209 Genitourinary Medical History: Negative for: End Stage Renal Disease Immunological Medical History: Negative for: Lupus Erythematosus; Raynaudos; Scleroderma Integumentary (Skin) Medical History: Negative for: History of Burn; History of pressure wounds Musculoskeletal Medical History: Positive for: Osteoarthritis Negative for: Gout; Rheumatoid Arthritis; Osteomyelitis Neurologic Medical History: Positive for: Neuropathy Negative for: Dementia; Quadriplegia; Paraplegia; Seizure Disorder Past Medical History Notes: CVA - 1992 Oncologic Medical History: Positive for: Received Chemotherapy - last dose 2006 Negative for: Received Radiation Psychiatric Complaints and Symptoms: No Complaints or Symptoms Medical History: TIBURCIO, LINDER ([id number]) Negative for: Anorexia/bulimia; Confinement Anxiety Immunizations Pneumococcal Vaccine: Received  Pneumococcal Vaccination: Yes Implantable Devices Hospitalization / Surgery History Name of Hospital Purpose of  Hospitalization/Surgery Date [facility] bronchitis 11/03/2013 Family and Social History Cancer: Yes - Paternal Grandparents; Diabetes: No; Heart Disease: No; Hereditary Spherocytosis: No; Hypertension: No; Kidney Disease: Yes - Siblings; Lung Disease: Yes - Mother; Seizures: No; Stroke: No; Thyroid Problems: No; Tuberculosis: No; Never smoker; Marital Status - Married; Alcohol Use: Never; Drug Use: No History; Caffeine Use: Never; Financial Concerns: No; Food, Clothing or Shelter Needs: No; Support System Lacking: No; Transportation Concerns: No; Advanced Directives: No; Patient does not want information on Advanced Directives Physician Affirmation I have reviewed and agree with the above information. Electronic Signature(s) Signed: 06/12/2018 8:35:34 AM By: Worthy Keeler PA-C Signed: 06/12/2018 5:48:26 PM By: Montey Hora Entered By: Worthy Keeler on 06/11/2018 17:24:59 TOBENNA, NEEDS ([id number]) -------------------------------------------------------------------------------- SuperBill Details Patient Name: Adam Merritt Date of Service: 06/11/2018 Medical Record Number: [id number] Patient Account Number: [id number] Date of Birth/Sex: 1946-03-24 (72 y.o. 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 layer exposed E66.9 Obesity, unspecified Facility Procedures CPT4 Code: [id number] Description: 11042 - DEB SUBQ TISSUE 20 SQ CM/< ICD-10 Diagnosis Description L98.492 Non-pressure chronic ulcer of skin of other sites with fat  lay Modifier: er exposed Quantity: 1 Physician Procedures CPT4 Code Description: 8184037 99214 - WC PHYS LEVEL 4 - EST PT ICD-10 Diagnosis Description I89.0 Lymphedema, not elsewhere classified L97.512 Non-pressure chronic ulcer of other part of right foot with fat L97.822 Non-pressure chronic ulcer of other  part of left lower leg with L98.492 Non-pressure chronic ulcer of skin of other sites with fat layer Modifier: 25 layer exposed fat layer expos exposed Quantity: 1 ed CPT4 Code Description: 5436067 11042 - WC PHYS SUBQ TISS 20 SQ CM ICD-10 Diagnosis Description L98.492 Non-pressure chronic ulcer of skin of other sites with fat layer Modifier: exposed Quantity: 1 Electronic Signature(s) Signed: 06/20/2018 6:45:38 AM By: Darlin Priestly Signed: 06/20/2018 5:12:40 PM By: Worthy Keeler PA-C Previous Signature: 06/12/2018 8:35:34 AM Version By: Worthy Keeler PA-C Entered By: Darlin Priestly on 06/20/2018 06:45:38

## 2018-06-13 NOTE — Progress Notes (Signed)
Patient ID: Adam Merritt, male    DOB: [birth year], 73 y.o.   MRN: [id number]  HPI  Adam Merritt is a 73 y/o male with a history of CAD, DM, hyperlipidemia, HTN, stroke, COPD, lymphedema and chronic heart failure.   Echo report from 09/16/16 reviewed and showed an EF of 65-70%.  Admitted 04/16/18 due to acute on chronic HF. Cardiology consult obtained. Diuresed and discharged after 2 days.   He presents today for a follow-up visit with a chief complaint of moderate shortness of breath upon minimal exertion. He describes this as chronic in nature having been present for several years. He has associated fatigue and pedal edema along with this. He denies any difficulty sleeping, abdominal distention, palpitations, chest pain, dizziness or weight gain. He was supposed to increase his potassium supplements after last lab results received but he misunderstood and continued to take 28meq BID instead of 11meq BID. Hasn't been weighing himself daily nor wearing his oxygen at bedtime. Has decreased his food intake and has been trying to eat lower sodium foods.   Past Medical History:  Diagnosis Date  . Anemia   . CAD (coronary artery disease)   . Chest pain   . Coronary artery disease   . Diabetes mellitus without complication ([name])   . Diastolic CHF ([place])   . Esophageal reflux   . Herpes zoster without mention of complication   . Hyperlipidemia   . Hypertension   . Insomnia   . Lumbago   . Lymphedema   . Neuropathy in diabetes ([place])   . Obstructive chronic bronchitis without exacerbation ([place])   . Other malaise and fatigue   . Stroke ([place])   . Varicose veins    Past Surgical History:  Procedure Laterality Date  . CHOLECYSTECTOMY    . CORONARY ARTERY BYPASS GRAFT     Family History  Problem Relation Age of Onset  . Diabetes Mother   . Hyperlipidemia Mother   . Hypertension Mother   . Diabetes Father   . Hyperlipidemia Father   . Hypertension Father   . CAD Father    Social History    Tobacco Use  . Smoking status: Never Smoker  . Smokeless tobacco: Never Used  Substance Use Topics  . Alcohol use: No    Comment: quit alcohol 30 years ago   No Known Allergies  Prior to Admission medications   Medication Sig Start Date End Date Taking? Authorizing Provider  albuterol (PROVENTIL HFA;VENTOLIN HFA) 108 (90 Base) MCG/ACT inhaler INHALE 1 PUFF INTO THE LUNGS EVERY 6 HOURS AS NEEDED FOR WHEEZING OR SHORTNESS OF BREATH Patient taking differently: Inhale 1 puff into the lungs every 6 (six) hours as needed for wheezing or shortness of breath.  03/11/18  Yes Allyne Gee, MD  aspirin 325 MG EC tablet Take 325 mg by mouth daily.   Yes [provider]  atorvastatin (LIPITOR) 40 MG tablet Take 40 mg by mouth at bedtime.    Yes [provider]  carvedilol (COREG) 25 MG tablet Take 25 mg by mouth 2 (two) times daily with a meal.   Yes [provider]  docusate sodium (COLACE) 50 MG capsule Take 50 mg by mouth 2 (two) times daily.   Yes [provider]  febuxostat (ULORIC) 40 MG tablet Take 1 tablet (40 mg total) by mouth daily. 02/12/18  Yes [name], MD  fentaNYL (DURAGESIC - DOSED MCG/HR) 50 MCG/HR Place 1 patch (50 mcg total) onto the skin  every 3 (three) days. 05/27/18  Yes Boscia, Greer Ee, NP  furosemide (LASIX) 40 MG tablet Take 1 tablet (40 mg total) by mouth daily. 04/18/18  Yes Max Sane, MD  gabapentin (NEURONTIN) 300 MG capsule Take 1 capsule po in AM and 1 capsule po in pm. Take 2 capsules po at bedtime. 05/27/18  Yes Boscia, Heather E, NP  hydrALAZINE (APRESOLINE) 25 MG tablet TAKE 1 TABLET BY MOUTH THREE TIMES DAILY FOR BLOOD PRESSURE Patient taking differently: Take 25 mg by mouth 3 (three) times daily.  02/12/18  Yes [name], MD  indapamide (LOZOL) 2.5 MG tablet Take 1 tablet (2.5 mg total) by mouth daily. 11/27/17  Yes [name], MD  Insulin Glargine (BASAGLAR KWIKPEN) 100 UNIT/ML SOPN INJECT 30 TO 36 UNITS UNDER THE  SKIN EVERY DAY Patient taking differently: Inject 30-36 Units into the skin daily.  03/26/18  Yes Scarboro, Audie Clear, NP  ipratropium-albuterol (DUONEB) 0.5-2.5 (3) MG/3ML SOLN Take 3 mLs by nebulization every 4 (four) hours as needed (for shortness of breath). 09/10/17  Yes [name], MD  Lancet Devices (ONE TOUCH DELICA LANCING DEV) MISC Use as directed 05/01/18  Yes Boscia, Greer Ee, NP  liraglutide (VICTOZA) 18 MG/3ML SOPN Inject 0.3 mLs (1.8 mg total) into the skin daily. 05/23/18  Yes Ronnell Freshwater, NP  Multiple Vitamin (MULTIVITAMIN WITH MINERALS) TABS tablet Take 1 tablet by mouth daily.   Yes [provider]  omeprazole (PRILOSEC) 20 MG capsule Take 20 mg by mouth daily.   Yes [provider]  Jonetta Speak LANCETS 81W MISC Use twice daily diag e11.65 05/01/18  Yes Boscia, Greer Ee, NP  pentoxifylline (TRENTAL) 400 MG CR tablet TAKE 1 TABLET BY MOUTH THREE TIMES DAILY FOR CLAUDICATION Patient taking differently: Take 400 mg by mouth 3 (three) times daily.  03/11/18  Yes Boscia, Greer Ee, NP  sennosides-docusate sodium (SENOKOT-S) 8.6-50 MG tablet Take 1-2 tablets by mouth 2 (two) times daily.   Yes [provider]  UREA 20 INTENSIVE HYDRATING 20 % cream USE AS DIRECTED 3 TIMES A WEEK 09/12/17  Yes [name], MD  zolpidem (AMBIEN CR) 12.5 MG CR tablet Take 1 tablet (12.5 mg total) by mouth at bedtime as needed for sleep. 03/08/18  Yes Scarboro, Audie Clear, NP  colchicine 0.6 MG tablet Take 1 tablet (0.6 mg total) by mouth daily. Patient not taking: Reported on 06/13/2018 08/31/17 08/31/18  Dustin Flock, MD  ibuprofen (ADVIL,MOTRIN) 600 MG tablet Take 600 mg by mouth 2 (two) times daily as needed for mild pain or moderate pain.     [provider]  metolazone (ZAROXOLYN) 2.5 MG tablet Take 1 tablet (2.5 mg total) by mouth daily. Patient not taking: Reported on 06/13/2018 02/12/18   [name], MD  Oxycodone HCl 10 MG TABS Half to one tab as needed prn  for pain Patient not taking: Reported on 06/13/2018 11/28/17   [name], MD  potassium chloride SA (K-DUR,KLOR-CON) 20 MEQ tablet Take 1 tablet (20 mEq total) by mouth 2 (two) times daily. 06/13/18   Alisa Graff, FNP    Review of Systems  Constitutional: Positive for fatigue (tire easily). Negative for appetite change.  HENT: Positive for hearing loss. Negative for congestion and sore throat.   Eyes: Negative.   Respiratory: Positive for shortness of breath (with minimal exertion). Negative for chest tightness.   Cardiovascular: Positive for leg swelling. Negative for chest pain and palpitations.  Gastrointestinal: Negative  for abdominal distention and abdominal pain.  Endocrine: Negative.   Genitourinary: Negative.   Musculoskeletal: Negative for back pain and neck pain.  Skin: Positive for wound (open blisters on left lower leg).  Allergic/Immunologic: Negative.   Neurological: Negative for dizziness and light-headedness.  Hematological: Negative for adenopathy. Does not bruise/bleed easily.  Psychiatric/Behavioral: Negative for dysphoric mood and sleep disturbance (sleeping on 2 pillows; oxgyen on 4L at bedtime). The patient is not nervous/anxious.    Vitals:   06/13/18 1040  BP: (!) 115/58  Pulse: 74  Resp: 18  SpO2: 98%  Weight: (!) 304 lb 4 oz (138 kg)  Height: 5\' 6"  (1.676 m)   Wt Readings from Last 3 Encounters:  06/13/18 (!) 304 lb 4 oz (138 kg)  05/27/18 (!) 305 lb 3.2 oz (138.4 kg)  05/13/18 (!) 320 lb 8 oz (145.4 kg)   Lab Results  Component Value Date   CREATININE 1.84 (H) 05/13/2018   CREATININE 1.93 (H) 04/18/2018   CREATININE 1.89 (H) 04/17/2018    Physical Exam Vitals signs and nursing note reviewed.  Constitutional:      Appearance: He is well-developed.  HENT:     Head: Normocephalic and atraumatic.  Neck:     Musculoskeletal: Normal range of motion and neck supple.  Cardiovascular:     Rate and Rhythm: Normal rate and regular rhythm.   Pulmonary:     Effort: Pulmonary effort is normal. No respiratory distress.     Breath sounds: No wheezing or rales.  Abdominal:     General: There is no distension.     Palpations: Abdomen is soft.  Musculoskeletal:     Right lower leg: He exhibits no tenderness. Edema (lymphedema) present.     Left lower leg: He exhibits no tenderness. Edema (lymphedema) present.  Skin:    General: Skin is warm and dry.  Neurological:     Mental Status: He is alert and oriented to person, place, and time.  Psychiatric:        Behavior: Behavior normal.    Assessment & Plan:  1: Chronic heart failure with preserved ejection fraction- - NYHA class III - euvolemic today although does have chronic lymphedema - not weighing daily but does have scales at home; reviewed the importance of weighing daily first thing in the morning after using the bathroom, write the weight down and call for an overnight weight gain of >2 pounds or a weekly weight gain of >5 pounds - weight down 16 pounds from last visit 1 month ago - adds salt "on occasion" but does eat out almost every meal because they don't like to cook. He says that he's been eating more salads than he used to  - currently has home health nurse coming - saw cardiology Lyndel Pleasure) 03/07/18 - BNP 04/16/18 was 29.0 - says that he's received his flu vaccine for this season   2: HTN- - BP looks good today - saw PCP Junius Creamer) 05/27/18 - BMP from 05/13/18 reviewed and showed sodium 138, potassium 2.9, creatinine 1.84 and GFR 36 - had not increased his potassium to 33meq BID 05/13/18 and continued to take 91meq BID;  will get BMP today  3: DM- - A1c 04/16/18 was 6.7% - glucose at home yesterday was 144  4: COPD-  - supposed to be wearing oxygen at 4L at bedtime but admits that he hasn't been wearing it; encouraged him to wear it every night when he goes to bed - saw pulmonologist Versie Starks) 03/26/18  5:  Lymphedema- - seen at the wound center  06/11/2018 - is waiting on wrap around compression socks as he says that neither he nor his wife can get regular compression socks on  - does have compression boots at home that he tries to use when his legs aren't so swollen that they can't fit - has been elevating his legs more than he used to   Patient did not bring his medications nor a list. Each medication was verbally reviewed with the patient and he was encouraged to bring the bottles to every visit to confirm accuracy of list.  Return in 2 months or sooner for any questions/problems before then.

## 2018-06-13 NOTE — Patient Instructions (Addendum)
Resume weighing daily and call for an overnight weight gain of > 2 pounds or a weekly weight gain of >5 pounds.  Wear oxygen at bedtime.

## 2018-06-13 NOTE — Telephone Encounter (Signed)
Spoke with patient and wife over speakerphone regarding lab results that were obtained today (06/13/2018). Renal function is stable but potassium is now 2.8.  On 05/13/18, he was instructed to increase his potassium to 2 tablets BID but he misunderstood and did not increase the dosage and so continued to take 1 tablet (57meq) BID. Potassium on 05/13/18 was 2.9.  Advised patient and his wife that he needed to take 2 tablets (76meq) of potassium in the morning and 2 more tablets in the evening. Patient and wife both verbalize understanding. New potassium RX sent to his pharmacy.   He says that he sees his PCP in ~ 2 weeks so will message his PCP and ask if they will check BMP.

## 2018-06-18 ENCOUNTER — Encounter: Payer: Medicare Other | Admitting: Physician Assistant

## 2018-06-18 DIAGNOSIS — A401 Sepsis due to streptococcus, group B: Secondary | ICD-10-CM | POA: Diagnosis not present

## 2018-06-18 DIAGNOSIS — L03116 Cellulitis of left lower limb: Secondary | ICD-10-CM | POA: Diagnosis not present

## 2018-06-19 NOTE — Progress Notes (Signed)
Adam Merritt, Adam Merritt ([id number]) Visit Report for 06/18/2018 Chief Complaint Document Details Patient Name: Adam Merritt Date of Service: 06/18/2018 11:15 AM Medical Record Number: [id number] Patient Account Number: [id number] Date of Birth/Sex: [birth year] (72 y.o. M) Treating RN: Montey Hora Primary Care Provider: [name] Other Clinician: Referring Provider: [name] Treating Provider/Extender: Melburn Hake, [name]: 10 Information Obtained from: Patient Chief Complaint he is here for evaluation of bilateral lower extremity lymphedema right right toe and left LE ulcers Electronic Signature(s) Signed: 06/18/2018 5:17:02 PM By: Worthy Keeler PA-C Entered By: Worthy Keeler on 06/18/2018 11:09:50 Adam Merritt ([id number]) -------------------------------------------------------------------------------- Debridement Details Patient Name: Adam Merritt Date of Service: 06/18/2018 11:15 AM Medical Record Number: [id number] Patient Account Number: [id number] Date of Birth/Sex: 10/25/1945 (72 y.o. M) Treating RN: Montey Hora Primary Care Provider: [name] Other Clinician: Referring Provider: [name] Treating Provider/Extender: Melburn Hake, [name]: 10 Debridement Performed for Wound #14 Right Toe Great Assessment: Performed By: Physician STONE III, HOYT E., PA-C Debridement Type: Debridement Level of Consciousness (Pre- Awake and Alert procedure): Pre-procedure Verification/Time Yes - 12:08 Out Taken: Start Time: 12:08 Pain Control: Lidocaine 4% Topical Solution Total Area Debrided (L x W): 0.9 (cm) x 1.4 (cm) = 1.26 (cm) Tissue and other material Callus, Slough, Subcutaneous, Slough debrided: Level: Skin/Subcutaneous Tissue Debridement Description: Excisional Instrument: Curette Bleeding: Minimum Hemostasis Achieved: Pressure End Time: 12:10 Procedural Pain: 0 Post Procedural Pain: 0 Response to Treatment: Procedure was  tolerated well Level of Consciousness Awake and Alert (Post-procedure): Post Debridement Measurements of Total Wound Length: (cm) 0.9 Width: (cm) 1.4 Depth: (cm) 0.3 Volume: (cm) 0.297 Character of Wound/Ulcer Post Debridement: Improved Post Procedure Diagnosis Same as Pre-procedure Electronic Signature(s) Signed: 06/18/2018 5:17:02 PM By: Worthy Keeler PA-C Signed: 06/18/2018 5:22:36 PM By: Montey Hora Entered By: Montey Hora on 06/18/2018 12:10:10 Adam Merritt ([id number]) -------------------------------------------------------------------------------- HPI Details Patient Name: Adam Merritt Date of Service: 06/18/2018 11:15 AM Medical Record Number: [id number] Patient Account Number: [id number] Date of Birth/Sex: [birth year] (72 y.o. M) Treating RN: Montey Hora Primary Care Provider: [name] Other Clinician: Referring Provider: [name] Treating Provider/Extender: Melburn Hake, [name]: 10 History of Present Illness HPI Description: 10/18/17-He is here for initial evaluation of bilateral lower extremity ulcerations in the presence of venous insufficiency and lymphedema. He has been seen by vascular medicine in the past, Dr. Lucky Cowboy, last seen in 2016. He does have a history of abnormal ABIs, which is to be expected given his lymphedema and venous insufficiency. According to Epic, it appears that all attempts for arterial evaluation and/or angiography were not follow through with by patient. He does have a history of being seen in lymphedema clinic in 2018, stopped going approximately 6 months ago stating "it didn't do any good". He does not have lymphedema pumps, he does not have custom fit compression wrap/stockings. He is diabetic and his recent A1c last month was 7.6. He admits to chronic bilateral lower extremity pain, no change in pain since blister and ulceration development. He is currently being treated with Levaquin for bronchitis. He has home  health and we will continue. 10/25/17-He is here in follow-up evaluation for bilateral lower extremity ulcerationssubtle he remains on Levaquin for bronchitis. Right lower extremity with no evidence of drainage or ulceration, persistent left lower extremity ulceration. He states that home health has not been out since his appointment. [facility], studies revealed: RIGHT ABI  0.9, TBI 0.6 LEFT ABI 1.1, TBI 0.6 with triphasic flow bilaterally. We will continue his same treatment plan. He has been educated on compression therapy and need for elevation. He will benefit from lymphedema pumps 11/01/17-He is here in follow-up evaluation for left lower extremity ulcer. The right lower extremity remains healed. He has home health services, but they have not been out to see the patient for 2-3 weeks. He states it home health physical therapy changed his dressing yesterday after therapy; he placed Ace wrap compression. We are still waiting for lymphedema pumps, reordered d/t need for company change. 11/08/17-He is here in follow-up evaluation for left lower extremity ulcer. It is improved. Edema is significantly improved with compression therapy. We will continue with same treatment plan and he will follow-up next week. No word regarding lymphedema pumps 11/15/17-He is here in follow-up evaluation for left lower extremity ulcer. He is healed and will be discharged from wound care services. I have reached out to [facility] regarding his lymphedema pumps. They have been unable to reach the patient; the contact number they had with the patient's wife's cell phone and she has not answered any unrecognized calls. Contact should be made today, trial planned for next week; [facility] will continue to follow 11/27/17 on evaluation today patient has multiple blistered areas over the right lower extremity his left lower extremity appears to be doing okay. These blistered  areas show signs of no infection which is great news. With that being said he did have some necrotic skin overlying which was mechanically debrided away with saline and gauze today without complication. Overall post debridement the wounds appear to be doing better but in general his swelling seems to be increased. This is obviously not good news. I think this is what has given rise to the blisters. 12/04/17 on evaluation today patient presents for follow-up concerning his bilateral lower extremity edema in the right lower extremity ulcers. He has been tolerating the dressing changes without complication. With that being said he has had no real issues with the wraps which is also good news. Overall I'm pleased with the progress he's been making. 12/11/17 on evaluation today patient appears to be doing rather well in regard to his right lateral lower extremity ulcer. He's been tolerating the dressing changes without complication. Fortunately there does not appear to be any evidence of infection at this time. Overall I'm pleased with the progress that is being made. Unfortunately he has been in the hospital due to having what sounds to be a stomach virus/flu fortunately that is starting to get better. 12/18/17 on evaluation today patient actually appears to be doing very well in regard to his bilateral lower extremities the swelling is under fairly good control his lymphedema pumps are still not up and running quite yet. With that being said he does have several areas of opening noted as far as wounds are concerned mainly over the left lower extremity. With that being said I do believe once he gets lymphedema pumps this would least help you mention some the fluid and preventing this from occurring. Hopefully that will be set up soon sleeves are Artie in place at his home he just waiting for the machine. 12/25/17 on evaluation today patient actually appears to be doing excellent in fact all of his ulcers  appear to have resolved his Adam Merritt, Adam Merritt. ([id number]) legs appear very well. I do think he needs compression stockings we have discussed this and they are actually going to  go to [place] today to elastic therapy to get this fitted for him. I think that is definitely a good thing to do. Readmission: 04/09/18 upon evaluation today this patient is seen for readmission due to bilateral lower extremity lymphedema. He has significant swelling of his extremities especially on the left although the right is also swollen he has weeping from both sides. There are no obvious open wounds at this point. Fortunately he has been doing fairly well for quite a bit of time since I last saw him. Nonetheless unfortunately this seems to have reopened and is giving quite a bit of trouble. He states this began about a week ago when he first called [place] to get in to be seen. No fevers, chills, nausea, or vomiting noted at this time. He has not been using his lymphedema pumps due to the fact that they won't fit on his leg at this point likewise is also not been using his compression for essentially the same reason. 04/16/18 upon evaluation today patient actually appears to be doing a little better in regard to the fluid in his bilateral lower extremities. With that being said he's had three falls since I saw him last week. He also states that he's been feeling very poorly. I was concerned last week and feel like that the concern is still there as far as the congestion in his chest is concerned he seems to be breathing about the same as last week but again he states he's very weak he's not even able to walk further than from the chair to the door. His wife had to buy a wheelchair just to be able to get them out of the house to get to the appointment today. This has me very concerned. 04/23/18 on evaluation today patient actually appears to be doing much better than last week's evaluation. At that time actually had to  transport him to the ER via EMS and he subsequently was admitted for acute pulmonary edema, acute renal failure, and acute congestive heart failure. Fortunately he is doing much better. Apparently they did dialyze him and were able to take off roughly 35 pounds of fluid. Nonetheless he is feeling much better both in regard to his breathing and he's able to get around much better at this time compared to previous. Overall I'm very happy with how things are at this time. There does not appear to be any evidence of infection currently. No fevers, chills, nausea, or vomiting noted at this time. 04/30/2018 patient seen today for follow-up and management of bilateral lower extremity lymphedema. He did express being more sad today than usual due to the recent loss of his dog. He states that he has been compliant with using the lymphedema pumps. However he does admit a minute over the last 2-3 days he has not been using the pumps due to the recent loss of his dog. At this time there is no drainage or open wounds to his lower extremities. The left leg edema is measuring smaller today. Still has a significant amount of edema on bilateral lower extremities With dry flaky skin. He will be referred to the lymphedema clinic for further management. Will continue 3 layer compression wraps and follow-up in 1-2 weeks.Denies any pain, fever, chairs recently. No recent falls or injuries reported during this visit. 05/07/18 on evaluation today patient actually appears to be doing very well in regard to his lower extremities in general all things considered. With that being said he is having some pain  in the legs just due to the amount of swelling. He does have an area where he had a blister on the left lateral lower extremity this is open at this point other than that there's nothing else weeping at this time. 05/14/18 on evaluation today patient actually appears to be doing excellent all things considered in regard to  his lower extremities. He still has a couple areas of weeping on each leg which has continued to be the issue for him. He does have an appointment with the lymphedema clinic although this isn't until February 2020. That was the earliest they had. In the meantime he has continued to tolerate the compression wraps without complication. 05/28/18 on evaluation today patient actually appears to be doing more poorly in regard to his left lower extremity where he has a wound open at this point. He also had a fall where he subsequently injured his right great toe which has led to an open wound at the site unfortunately. He has been tolerating the dressing changes without complication in general as far as the wraps are concerned that he has not been putting any dressing on the left 1st toe ulcer site. 06/11/18 on evaluation today patient appears to be doing much worse in regard to his bilateral lower extremity ulcers. He has been tolerating the dressing changes without complication although his legs have not been wrapped more recently. Overall I am not very pleased with the way his legs appear. I do believe he needs to be back in compression wraps he still has not received his compression wraps from the [facility] hospital as of yet. 06/18/18 on evaluation today patient actually appears to be doing significantly better than last time I saw him. He has been tolerating the compression wraps without complication in the circumferential ulcers especially appear to be doing much better. His toe ulcer on the right in regard to the great toe is better although not as good as the legs in my pinion. No fevers chills noted Adam Merritt, Adam Merritt ([id number]) Electronic Signature(s) Signed: 06/18/2018 5:17:02 PM By: Worthy Keeler PA-C Entered By: Worthy Keeler on 06/18/2018 16:57:59 Adam Merritt, Adam Merritt ([id number]) -------------------------------------------------------------------------------- Physical Exam Details Patient Name:  Adam Merritt Date of Service: 06/18/2018 11:15 AM Medical Record Number: [id number] Patient Account Number: [id number] Date of Birth/Sex: 1945/12/12 (72 y.o. M) Treating RN: Montey Hora Primary Care Provider: [name] Other Clinician: Referring Provider: [name] Treating Provider/Extender: STONE III, [name]: 10 Constitutional Obese and well-hydrated in no acute distress. Respiratory normal breathing without difficulty. Cardiovascular 2+ pitting edema of the bilateral lower extremities. Psychiatric this patient is able to make decisions and demonstrates good insight into disease process. Alert and Oriented x 3. pleasant and cooperative. Notes Patient's wounds currently did not require sharp debridement in regard to his legs mechanical debridement was sufficient to clear away the slough down to a good tissue level. I did perform debridement in regard to the right great toe he tolerated this without complication. Electronic Signature(s) Signed: 06/18/2018 5:17:02 PM By: Worthy Keeler PA-C Entered By: Worthy Keeler on 06/18/2018 16:58:32 Adam Merritt ([id number]) -------------------------------------------------------------------------------- Physician Orders Details Patient Name: Adam Merritt Date of Service: 06/18/2018 11:15 AM Medical Record Number: [id number] Patient Account Number: [id number] Date of Birth/Sex: [birth year] (72 y.o. M) Treating RN: Montey Hora Primary Care Provider: [name] Other Clinician: Referring Provider: [name] Treating Provider/Extender: Melburn Hake, [name]: 10 Verbal / Phone Orders: No Diagnosis Coding ICD-10  Coding Code Description I89.0 Lymphedema, not elsewhere classified L97.512 Non-pressure chronic ulcer of other part of right foot with fat layer exposed L97.822 Non-pressure chronic ulcer of other part of left lower leg with fat layer exposed L98.492 Non-pressure chronic ulcer of skin  of other sites with fat layer exposed E66.9 Obesity, unspecified Wound Cleansing Wound #14 Right Toe Great o Clean wound with Normal Saline. o Cleanse wound with mild soap and water Wound #15 Left Lower Leg o Clean wound with Normal Saline. o Cleanse wound with mild soap and water Wound #16 Lateral Lower Leg o Clean wound with Normal Saline. o Cleanse wound with mild soap and water Wound #17 Right,Lateral Lower Leg o Clean wound with Normal Saline. o Cleanse wound with mild soap and water Skin Barriers/Peri-Wound Care Wound #14 Right Toe Great o Moisturizing lotion - to both lower legs Wound #15 Left Lower Leg o Moisturizing lotion - to both lower legs Wound #16 Lateral Lower Leg o Moisturizing lotion - to both lower legs Wound #17 Right,Lateral Lower Leg o Moisturizing lotion - to both lower legs Primary Wound Dressing Wound #14 Right Toe Great o Silver Collagen Wound #15 Left Lower Leg AYSON, CHERUBINI ([id number]) o Silver Alginate Wound #16 Lateral Lower Leg o Silver Alginate Wound #17 Right,Lateral Lower Leg o Silver Alginate Secondary Dressing Wound #14 Right Toe Great o Gauze and Kerlix/Conform Wound #15 Left Lower Leg o ABD pad o XtraSorb Wound #16 Lateral Lower Leg o ABD pad o XtraSorb Wound #17 Right,Lateral Lower Leg o ABD pad o XtraSorb Dressing Change Frequency Wound #14 Right Toe Great o Change Dressing Monday, Wednesday, Friday Wound #15 Left Lower Leg o Change Dressing Monday, Wednesday, Friday Wound #16 Lateral Lower Leg o Change Dressing Monday, Wednesday, Friday Wound #17 Right,Lateral Lower Leg o Change Dressing Monday, Wednesday, Friday Follow-up Appointments Wound #14 Right Toe Great o Return Appointment in 1 week. Wound #15 Left Lower Leg o Return Appointment in 1 week. Wound #16 Lateral Lower Leg o Return Appointment in 1 week. Wound #17 Right,Lateral Lower Leg o Return  Appointment in 1 week. Edema Control Wound #15 Left Lower Leg o 3 Layer Compression System - Bilateral Wound #16 Lateral Lower Leg o 3 Layer Compression System - Bilateral Adam Merritt, Adam Merritt ([id number]) Wound #17 Right,Lateral Lower Leg o 3 Layer Compression System - Bilateral Home Health Wound #14 Right Toe [place] Nurse may visit PRN to address patientos wound care needs. o FACE TO FACE ENCOUNTER: MEDICARE and MEDICAID PATIENTS: I certify that this patient is under my care and that I had a face-to-face encounter that meets the physician face-to-face encounter requirements with this patient on this date. The encounter with the patient was in whole or in part for the following MEDICAL CONDITION: (primary reason for [place]) MEDICAL NECESSITY: I certify, that based on my findings, NURSING services are a medically necessary home health service. HOME BOUND STATUS: I certify that my clinical findings support that this patient is homebound (i.e., Due to illness or injury, pt requires aid of supportive devices such as crutches, cane, wheelchairs, walkers, the use of special transportation or the assistance of another person to leave their place of residence. There is a normal inability to leave the home and doing so requires considerable and taxing effort. Other absences are for medical reasons / religious services and are infrequent or of short duration when for other reasons). o If current dressing causes regression in wound  condition, may D/C ordered dressing product/s and apply Normal Saline Moist Dressing daily until next [place] / Other MD appointment. [place] of regression in wound condition at [phone]. o Please direct any NON-WOUND related issues/requests for orders to patient's Primary Care Physician Wound #15 Left Lower Leg o [place] Nurse may  visit PRN to address patientos wound care needs. o FACE TO FACE ENCOUNTER: MEDICARE and MEDICAID PATIENTS: I certify that this patient is under my care and that I had a face-to-face encounter that meets the physician face-to-face encounter requirements with this patient on this date. The encounter with the patient was in whole or in part for the following MEDICAL CONDITION: (primary reason for [place]) MEDICAL NECESSITY: I certify, that based on my findings, NURSING services are a medically necessary home health service. HOME BOUND STATUS: I certify that my clinical findings support that this patient is homebound (i.e., Due to illness or injury, pt requires aid of supportive devices such as crutches, cane, wheelchairs, walkers, the use of special transportation or the assistance of another person to leave their place of residence. There is a normal inability to leave the home and doing so requires considerable and taxing effort. Other absences are for medical reasons / religious services and are infrequent or of short duration when for other reasons). o If current dressing causes regression in wound condition, may D/C ordered dressing product/s and apply Normal Saline Moist Dressing daily until next [place] / Other MD appointment. [place] of regression in wound condition at [phone]. o Please direct any NON-WOUND related issues/requests for orders to patient's Primary Care Physician Wound #16 Lateral Lower Leg o [place] Nurse may visit PRN to address patientos wound care needs. o FACE TO FACE ENCOUNTER: MEDICARE and MEDICAID PATIENTS: I certify that this patient is under my care and that I had a face-to-face encounter that meets the physician face-to-face encounter requirements with this patient on this date. The encounter with the patient was in whole or in part for the following MEDICAL CONDITION:  (primary reason for [place]) MEDICAL NECESSITY: I certify, that based on my findings, NURSING services are a medically necessary home health service. HOME BOUND STATUS: I certify that my clinical findings support that this patient is homebound (i.e., Due to illness or injury, pt requires aid of supportive devices such as crutches, cane, wheelchairs, walkers, the use of special transportation or the assistance of another person to leave their place of residence. There is a normal inability to leave the home and doing so requires considerable and taxing effort. Other absences are for medical reasons / religious services and are infrequent or of short duration when for other reasons). o If current dressing causes regression in wound condition, may D/C ordered dressing product/s and apply Normal Saline Moist Dressing daily until next [place] / Other MD appointment. [place] of regression in wound condition at [phone]. o Please direct any NON-WOUND related issues/requests for orders to patient's Primary Care Physician Adam Merritt, BUCKHALTER ([id number]) Wound #17 [place] Nurse may visit PRN to address patientos wound care needs. o FACE TO FACE ENCOUNTER: MEDICARE and MEDICAID PATIENTS: I certify that this patient is under my care and that I had a face-to-face encounter that meets the physician face-to-face encounter requirements with this patient on this date. The encounter  with the patient was in whole or in part for the following MEDICAL CONDITION: (primary reason for [place]) MEDICAL NECESSITY: I certify, that based on my findings, NURSING services are a medically necessary home health service. HOME BOUND STATUS: I certify that my clinical findings support that this patient is homebound (i.e., Due to illness or injury, pt requires aid of supportive devices such as crutches, cane,  wheelchairs, walkers, the use of special transportation or the assistance of another person to leave their place of residence. There is a normal inability to leave the home and doing so requires considerable and taxing effort. Other absences are for medical reasons / religious services and are infrequent or of short duration when for other reasons). o If current dressing causes regression in wound condition, may D/C ordered dressing product/s and apply Normal Saline Moist Dressing daily until next [place] / Other MD appointment. [place] of regression in wound condition at [phone]. o Please direct any NON-WOUND related issues/requests for orders to patient's Primary Care Physician Electronic Signature(s) Signed: 06/18/2018 5:17:02 PM By: Worthy Keeler PA-C Signed: 06/18/2018 5:22:36 PM By: Montey Hora Entered By: Montey Hora on 06/18/2018 12:11:59 Adam Merritt ([id number]) -------------------------------------------------------------------------------- Problem List Details Patient Name: Adam Merritt Date of Service: 06/18/2018 11:15 AM Medical Record Number: [id number] Patient Account Number: [id number] Date of Birth/Sex: [birth year] (73 y.o. 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 06/11/2018 No Yes exposed E66.9 Obesity, unspecified 04/09/2018 No Yes Inactive  Problems Resolved Problems ICD-10 Code Description Active Date Resolved Date L03.115 Cellulitis of right lower limb 04/09/2018 04/09/2018 L03.116 Cellulitis of left lower limb 04/09/2018 04/09/2018 Electronic Signature(s) Signed: 06/18/2018 5:17:02 PM By: Worthy Keeler PA-C Entered By: Worthy Keeler on 06/18/2018 11:09:44 Adam Merritt ([id number]) Adam Merritt, Adam Merritt ([id number]) -------------------------------------------------------------------------------- Progress Note Details Patient Name: Adam Merritt Date of Service: 06/18/2018 11:15 AM Medical Record Number: [id number] Patient Account Number: [id number] Date of Birth/Sex: 05-24-1946 (72 y.o. M) Treating RN: Montey Hora Primary Care Provider: [name] Other Clinician: Referring Provider: [name] Treating Provider/Extender: Melburn Hake, [name]: 10 Subjective Chief Complaint Information obtained from Patient he is here for evaluation of bilateral lower extremity lymphedema right right toe and left LE ulcers History of Present Illness (HPI) 10/18/17-He is here for initial evaluation of bilateral lower extremity ulcerations in the presence of venous insufficiency and lymphedema. He has been seen by vascular medicine in the past, Dr. Lucky Cowboy, last seen in 2016. He does have a history of abnormal ABIs, which is to be expected given his lymphedema and venous insufficiency. According to Epic, it appears that all attempts for arterial evaluation and/or angiography were not follow through with by patient. He does have a history of being seen in lymphedema clinic in 2018, stopped going approximately 6 months ago stating "it didn't do any good". He does not have lymphedema pumps, he does not have custom fit compression wrap/stockings. He is diabetic and his recent A1c last month was  7.6. He admits to chronic bilateral lower extremity pain, no change in pain since blister and ulceration development. He is currently being  treated with Levaquin for bronchitis. He has home health and we will continue. 10/25/17-He is here in follow-up evaluation for bilateral lower extremity ulcerationssubtle he remains on Levaquin for bronchitis. Right lower extremity with no evidence of drainage or ulceration, persistent left lower extremity ulceration. He states that home health has not been out since his appointment. He went to [facility] on Tuesday, studies revealed: RIGHT ABI 0.9, TBI 0.6 LEFT ABI 1.1, TBI 0.6 with triphasic flow bilaterally. We will continue his same treatment plan. He has been educated on compression therapy and need for elevation. He will benefit from lymphedema pumps 11/01/17-He is here in follow-up evaluation for left lower extremity ulcer. The right lower extremity remains healed. He has home health services, but they have not been out to see the patient for 2-3 weeks. He states it home health physical therapy changed his dressing yesterday after therapy; he placed Ace wrap compression. We are still waiting for lymphedema pumps, reordered d/t need for company change. 11/08/17-He is here in follow-up evaluation for left lower extremity ulcer. It is improved. Edema is significantly improved with compression therapy. We will continue with same treatment plan and he will follow-up next week. No word regarding lymphedema pumps 11/15/17-He is here in follow-up evaluation for left lower extremity ulcer. He is healed and will be discharged from wound care services. I have reached out to [facility] regarding his lymphedema pumps. They have been unable to reach the patient; the contact number they had with the patient's wife's cell phone and she has not answered any unrecognized calls. Contact should be made today, trial planned for next week; [facility] will continue to follow 11/27/17 on evaluation today patient has multiple blistered areas over the right lower extremity his left lower  extremity appears to be doing okay. These blistered areas show signs of no infection which is great news. With that being said he did have some necrotic skin overlying which was mechanically debrided away with saline and gauze today without complication. Overall post debridement the wounds appear to be doing better but in general his swelling seems to be increased. This is obviously not good news. I think this is what has given rise to the blisters. 12/04/17 on evaluation today patient presents for follow-up concerning his bilateral lower extremity edema in the right lower extremity ulcers. He has been tolerating the dressing changes without complication. With that being said he has had no real issues with the wraps which is also good news. Overall I'm pleased with the progress he's been making. 12/11/17 on evaluation today patient appears to be doing rather well in regard to his right lateral lower extremity ulcer. He's been tolerating the dressing changes without complication. Fortunately there does not appear to be any evidence of infection at this time. Overall I'm pleased with the progress that is being made. Unfortunately he has been in the hospital due to having what sounds to be a stomach virus/flu fortunately that is starting to get better. 12/18/17 on evaluation today patient actually appears to be doing very well in regard to his bilateral lower extremities the Adam Merritt, Adam Merritt. ([id number]) swelling is under fairly good control his lymphedema pumps are still not up and running quite yet. With that being said he does have several areas of opening noted as far as wounds are concerned mainly over the left  lower extremity. With that being said I do believe once he gets lymphedema pumps this would least help you mention some the fluid and preventing this from occurring. Hopefully that will be set up soon sleeves are Artie in place at his home he just waiting for the machine. 12/25/17 on evaluation  today patient actually appears to be doing excellent in fact all of his ulcers appear to have resolved his legs appear very well. I do think he needs compression stockings we have discussed this and they are actually going to go to [place] today to elastic therapy to get this fitted for him. I think that is definitely a good thing to do. Readmission: 04/09/18 upon evaluation today this patient is seen for readmission due to bilateral lower extremity lymphedema. He has significant swelling of his extremities especially on the left although the right is also swollen he has weeping from both sides. There are no obvious open wounds at this point. Fortunately he has been doing fairly well for quite a bit of time since I last saw him. Nonetheless unfortunately this seems to have reopened and is giving quite a bit of trouble. He states this began about a week ago when he first called [place] to get in to be seen. No fevers, chills, nausea, or vomiting noted at this time. He has not been using his lymphedema pumps due to the fact that they won't fit on his leg at this point likewise is also not been using his compression for essentially the same reason. 04/16/18 upon evaluation today patient actually appears to be doing a little better in regard to the fluid in his bilateral lower extremities. With that being said he's had three falls since I saw him last week. He also states that he's been feeling very poorly. I was concerned last week and feel like that the concern is still there as far as the congestion in his chest is concerned he seems to be breathing about the same as last week but again he states he's very weak he's not even able to walk further than from the chair to the door. His wife had to buy a wheelchair just to be able to get them out of the house to get to the appointment today. This has me very concerned. 04/23/18 on evaluation today patient actually appears to be doing much better than last  week's evaluation. At that time actually had to transport him to the ER via EMS and he subsequently was admitted for acute pulmonary edema, acute renal failure, and acute congestive heart failure. Fortunately he is doing much better. Apparently they did dialyze him and were able to take off roughly 35 pounds of fluid. Nonetheless he is feeling much better both in regard to his breathing and he's able to get around much better at this time compared to previous. Overall I'm very happy with how things are at this time. There does not appear to be any evidence of infection currently. No fevers, chills, nausea, or vomiting noted at this time. 04/30/2018 patient seen today for follow-up and management of bilateral lower extremity lymphedema. He did express being more sad today than usual due to the recent loss of his dog. He states that he has been compliant with using the lymphedema pumps. However he does admit a minute over the last 2-3 days he has not been using the pumps due to the recent loss of his dog. At this time there is no drainage or open wounds to his  lower extremities. The left leg edema is measuring smaller today. Still has a significant amount of edema on bilateral lower extremities With dry flaky skin. He will be referred to the lymphedema clinic for further management. Will continue 3 layer compression wraps and follow-up in 1-2 weeks.Denies any pain, fever, chairs recently. No recent falls or injuries reported during this visit. 05/07/18 on evaluation today patient actually appears to be doing very well in regard to his lower extremities in general all things considered. With that being said he is having some pain in the legs just due to the amount of swelling. He does have an area where he had a blister on the left lateral lower extremity this is open at this point other than that there's nothing else weeping at this time. 05/14/18 on evaluation today patient actually appears to be  doing excellent all things considered in regard to his lower extremities. He still has a couple areas of weeping on each leg which has continued to be the issue for him. He does have an appointment with the lymphedema clinic although this isn't until February 2020. That was the earliest they had. In the meantime he has continued to tolerate the compression wraps without complication. 05/28/18 on evaluation today patient actually appears to be doing more poorly in regard to his left lower extremity where he has a wound open at this point. He also had a fall where he subsequently injured his right great toe which has led to an open wound at the site unfortunately. He has been tolerating the dressing changes without complication in general as far as the wraps are concerned that he has not been putting any dressing on the left 1st toe ulcer site. 06/11/18 on evaluation today patient appears to be doing much worse in regard to his bilateral lower extremity ulcers. He has been tolerating the dressing changes without complication although his legs have not been wrapped more recently. Overall I am not very pleased with the way his legs appear. I do believe he needs to be back in compression wraps he still has not received his compression wraps from the [facility] hospital as of yet. Adam Merritt, Adam Merritt ([id number]) 06/18/18 on evaluation today patient actually appears to be doing significantly better than last time I saw him. He has been tolerating the compression wraps without complication in the circumferential ulcers especially appear to be doing much better. His toe ulcer on the right in regard to the great toe is better although not as good as the legs in my pinion. No fevers chills noted Patient History Information obtained from Patient. Family History Cancer - Paternal Grandparents, Kidney Disease - Siblings, Lung Disease - Mother, No family history of Diabetes, Heart Disease, Hereditary Spherocytosis,  Hypertension, Seizures, Stroke, Thyroid Problems, Tuberculosis. Social History Never smoker, Marital Status - Married, Alcohol Use - Never, Drug Use - No History, Caffeine Use - Never. Medical History Hospitalization/Surgery History - 11/03/2013, [facility], bronchitis. Medical And Surgical History Notes Respiratory home O2 at night as needed Cardiovascular varicose veins Neurologic CVA - 1992 Review of Systems (ROS) Constitutional Symptoms (General Health) Denies complaints or symptoms of Fever, Chills. Respiratory The patient has no complaints or symptoms. Cardiovascular Complains or has symptoms of LE edema. Psychiatric The patient has no complaints or symptoms. Objective Constitutional Obese and well-hydrated in no acute distress. Vitals Time Taken: 11:19 AM, Height: 66 in, Weight: 323 lbs, BMI: 52.1, Temperature: 97.9 F, Pulse: 69 bpm, Respiratory Rate: 18 breaths/min, Blood Pressure: 153/68 mmHg. Respiratory  normal breathing without difficulty. Adam Merritt, Adam Merritt ([id number]) Cardiovascular 2+ pitting edema of the bilateral lower extremities. Psychiatric this patient is able to make decisions and demonstrates good insight into disease process. Alert and Oriented x 3. pleasant and cooperative. General Notes: Patient's wounds currently did not require sharp debridement in regard to his legs mechanical debridement was sufficient to clear away the slough down to a good tissue level. I did perform debridement in regard to the right great toe he tolerated this without complication. Integumentary (Hair, Skin) Wound #14 status is Open. Original cause of wound was Trauma. The wound is located on the Right Toe Great. The wound measures 0.9cm length x 1.4cm width x 0.2cm depth; 0.99cm^2 area and 0.198cm^3 volume. There is Fat Layer (Subcutaneous Tissue) Exposed exposed. There is no tunneling or undermining noted. There is a small amount of serous drainage noted. The wound margin is flat  and intact. There is no granulation within the wound bed. There is no necrotic tissue within the wound bed. The periwound skin appearance exhibited: Callus, Excoriation. The periwound skin appearance did not exhibit: Crepitus, Induration, Rash, Scarring, Dry/Scaly, Maceration, Atrophie Blanche, Cyanosis, Ecchymosis, Hemosiderin Staining, Mottled, Pallor, Rubor, Erythema. Periwound temperature was noted as No Abnormality. The periwound has tenderness on palpation. Wound #15 status is Open. Original cause of wound was Trauma. The wound is located on the Left Lower Leg. The wound measures 5.4cm length x 32cm width x 0.1cm depth; 135.717cm^2 area and 13.572cm^3 volume. There is Fat Layer (Subcutaneous Tissue) Exposed exposed. There is no tunneling or undermining noted. There is a large amount of serosanguineous drainage noted. The wound margin is flat and intact. There is large (67-100%) red, hyper - granulation within the wound bed. There is a small (1-33%) amount of necrotic tissue within the wound bed including Adherent Slough. The periwound skin appearance did not exhibit: Callus, Crepitus, Excoriation, Induration, Rash, Scarring, Dry/Scaly, Maceration, Atrophie Blanche, Cyanosis, Ecchymosis, Hemosiderin Staining, Mottled, Pallor, Rubor, Erythema. The periwound has tenderness on palpation. Wound #16 status is Open. Original cause of wound was Blister. The wound is located on the Lateral Lower Leg. The wound measures 3cm length x 5cm width x 0.1cm depth; 11.781cm^2 area and 1.178cm^3 volume. There is no tunneling or undermining noted. There is a large amount of serosanguineous drainage noted. The wound margin is flat and intact. There is large (67-100%) red granulation within the wound bed. There is a small (1-33%) amount of necrotic tissue within the wound bed including Adherent Slough. The periwound skin appearance did not exhibit: Callus, Crepitus, Excoriation, Induration, Rash, Scarring,  Dry/Scaly, Maceration, Atrophie Blanche, Cyanosis, Ecchymosis, Hemosiderin Staining, Mottled, Pallor, Rubor, Erythema. The periwound has tenderness on palpation. Wound #17 status is Open. Original cause of wound was Blister. The wound is located on the Right,Lateral Lower Leg. The wound measures 7.5cm length x 5cm width x 0.1cm depth; 29.452cm^2 area and 2.945cm^3 volume. There is Fat Layer (Subcutaneous Tissue) Exposed exposed. There is no tunneling or undermining noted. There is a large amount of serosanguineous drainage noted. The wound margin is flat and intact. There is large (67-100%) red granulation within the wound bed. There is a small (1-33%) amount of necrotic tissue within the wound bed including Adherent Slough. The periwound skin appearance did not exhibit: Callus, Crepitus, Excoriation, Induration, Rash, Scarring, Dry/Scaly, Maceration, Atrophie Blanche, Cyanosis, Ecchymosis, Hemosiderin Staining, Mottled, Pallor, Rubor, Erythema. The periwound has tenderness on palpation. Other Condition(s) Patient presents with Lymphedema located on the Bilateral Leg. The skin appearance exhibited: Hemosiderin  Staining, Induration. The skin appearance did not exhibit: Atrophie Blanche, Callus, Crepitus, Cyanosis, Dry/Scaly, Ecchymosis, Erythema, Excoriation, Friable, Maceration, Mottled, Pallor, Rash, Rubor, Scarring. Skin temperature was noted as No Abnormality. 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 There was a Excisional Skin/Subcutaneous Tissue Debridement with a total  area of 1.26 sq cm performed by STONE III, HOYT E., PA-C. With the following instrument(s): Curette Material removed includes Callus, Subcutaneous Tissue, and Slough after achieving pain control using Lidocaine 4% Topical Solution. No specimens were taken. A time out was conducted at 12:08, prior to the start of the procedure. A Minimum amount of bleeding was controlled with Pressure. The procedure was tolerated well with a pain level of 0 throughout and a pain level of 0 following the procedure. Post Debridement Measurements: 0.9cm length x 1.4cm width x 0.3cm depth; 0.297cm^3 volume. Character of Wound/Ulcer Post Debridement is improved. Post procedure Diagnosis Wound #14: Same as Pre-Procedure Plan Wound Cleansing: Wound #14 Right Toe Great: Clean wound with Normal Saline. Cleanse wound with mild soap and water Wound #15 Left Lower Leg: Clean wound with Normal Saline. Cleanse wound with mild soap and water Wound #16 Lateral Lower Leg: Clean wound with Normal Saline. Cleanse wound with mild soap and water Wound #17 Right,Lateral Lower Leg: Clean wound with Normal Saline. Cleanse wound with mild soap and water Skin Barriers/Peri-Wound Care: Wound #14 Right Toe Great: Moisturizing lotion - to both lower legs Wound #15 Left Lower Leg: Moisturizing lotion - to both lower legs Wound #16 Lateral Lower Leg: Moisturizing lotion - to both lower legs Wound #17 Right,Lateral Lower Leg: Moisturizing lotion - to both lower legs Adam Merritt, Adam Merritt ([id number]) Primary Wound Dressing: Wound #14 Right Toe Great: Silver Collagen Wound #15 Left Lower Leg: Silver Alginate Wound #16 Lateral Lower Leg: Silver Alginate Wound #17 Right,Lateral Lower Leg: Silver Alginate Secondary Dressing: Wound #14 Right Toe Great: Gauze and Kerlix/Conform Wound #15 Left Lower Leg: ABD pad XtraSorb Wound #16 Lateral Lower Leg: ABD pad XtraSorb Wound #17 Right,Lateral Lower Leg: ABD pad XtraSorb Dressing  Change Frequency: Wound #14 Right Toe Great: Change Dressing Monday, Wednesday, Friday Wound #15 Left Lower Leg: Change Dressing Monday, Wednesday, Friday Wound #16 Lateral Lower Leg: Change Dressing Monday, Wednesday, Friday Wound #17 Right,Lateral Lower Leg: Change Dressing Monday, Wednesday, Friday Follow-up Appointments: Wound #14 Right Toe Great: Return Appointment in 1 week. Wound #15 Left Lower Leg: Return Appointment in 1 week. Wound #16 Lateral Lower Leg: Return Appointment in 1 week. Wound #17 Right,Lateral Lower Leg: Return Appointment in 1 week. Edema Control: Wound #15 Left Lower Leg: 3 Layer Compression System - Bilateral Wound #16 Lateral Lower Leg: 3 Layer Compression System - Bilateral Wound #17 Right,Lateral Lower Leg: 3 Layer Compression System - Bilateral Home Health: Wound #14 Right Toe Great: [place] Nurse may visit PRN to address patient s wound care needs. FACE TO FACE ENCOUNTER: MEDICARE and MEDICAID PATIENTS: I certify that this patient is under my care and that I had a face-to-face encounter that meets the physician face-to-face encounter requirements with this patient on this date. The encounter with  the patient was in whole or in part for the following MEDICAL CONDITION: (primary reason for [place]) MEDICAL NECESSITY: I certify, that based on my findings, NURSING services are a medically necessary home health service. HOME BOUND STATUS: I certify that my clinical findings support that this patient is homebound (i.e., Due to illness or injury, pt requires aid of supportive devices such as crutches, cane, wheelchairs, walkers, the use of special transportation or the assistance of another person to leave their place of residence. There is a normal inability to leave the home and doing so requires considerable and taxing effort. Other absences are for medical reasons / religious services and are infrequent or of  short duration when for other reasons). If current dressing causes regression in wound condition, may D/C ordered dressing product/s and apply Normal Saline JASPER, HANF. ([id number]) Moist Dressing daily until next [place] / Other MD appointment. [place] of regression in wound condition at [phone]. Please direct any NON-WOUND related issues/requests for orders to patient's Primary Care Physician Wound #15 Left Lower Leg: [place] Nurse may visit PRN to address patient s wound care needs. FACE TO FACE ENCOUNTER: MEDICARE and MEDICAID PATIENTS: I certify that this patient is under my care and that I had a face-to-face encounter that meets the physician face-to-face encounter requirements with this patient on this date. The encounter with the patient was in whole or in part for the following MEDICAL CONDITION: (primary reason for [place]) MEDICAL NECESSITY: I certify, that based on my findings, NURSING services are a medically necessary home health service. HOME BOUND STATUS: I certify that my clinical findings support that this patient is homebound (i.e., Due to illness or injury, pt requires aid of supportive devices such as crutches, cane, wheelchairs, walkers, the use of special transportation or the assistance of another person to leave their place of residence. There is a normal inability to leave the home and doing so requires considerable and taxing effort. Other absences are for medical reasons / religious services and are infrequent or of short duration when for other reasons). If current dressing causes regression in wound condition, may D/C ordered dressing product/s and apply Normal Saline Moist Dressing daily until next [place] / Other MD appointment. [place] of regression in wound condition at [phone]. Please direct any NON-WOUND related issues/requests for orders to  patient's Primary Care Physician Wound #16 Lateral Lower Leg: [place] Nurse may visit PRN to address patient s wound care needs. FACE TO FACE ENCOUNTER: MEDICARE and MEDICAID PATIENTS: I certify that this patient is under my care and that I had a face-to-face encounter that meets the physician face-to-face encounter requirements with this patient on this date. The encounter with the patient was in whole or in part for the following MEDICAL CONDITION: (primary reason for [place]) MEDICAL NECESSITY: I certify, that based on my findings, NURSING services are a medically necessary home health service. HOME BOUND STATUS: I certify that my clinical findings support that this patient is homebound (i.e., Due to illness or injury, pt requires aid of supportive devices such as crutches, cane, wheelchairs, walkers, the use of special transportation or the assistance of another person to leave their place of residence. There is a normal inability to leave the home and doing so requires considerable and taxing effort. Other absences are for medical reasons / religious services and are infrequent or of short  duration when for other reasons). If current dressing causes regression in wound condition, may D/C ordered dressing product/s and apply Normal Saline Moist Dressing daily until next [place] / Other MD appointment. Goff of regression in wound condition at [phone]. Please direct any NON-WOUND related issues/requests for orders to patient's Primary Care Physician Wound #17 Right,Lateral Lower Leg: [place] Nurse may visit PRN to address patient s wound care needs. FACE TO FACE ENCOUNTER: MEDICARE and MEDICAID PATIENTS: I certify that this patient is under my care and that I had a face-to-face encounter that meets the physician face-to-face encounter requirements with this patient on this date.  The encounter with the patient was in whole or in part for the following MEDICAL CONDITION: (primary reason for [place]) MEDICAL NECESSITY: I certify, that based on my findings, NURSING services are a medically necessary home health service. HOME BOUND STATUS: I certify that my clinical findings support that this patient is homebound (i.e., Due to illness or injury, pt requires aid of supportive devices such as crutches, cane, wheelchairs, walkers, the use of special transportation or the assistance of another person to leave their place of residence. There is a normal inability to leave the home and doing so requires considerable and taxing effort. Other absences are for medical reasons / religious services and are infrequent or of short duration when for other reasons). If current dressing causes regression in wound condition, may D/C ordered dressing product/s and apply Normal Saline Moist Dressing daily until next [place] / Other MD appointment. Cameron of regression in wound condition at [phone]. Please direct any NON-WOUND related issues/requests for orders to patient's Primary Care Physician At this point patient's wound seem to be doing very well on pleased in this regard. I'm in a recommend that we continue with the above wound care measures for the next week. He is in agreement the plan. If anything changes worsens will contact the office and let me know. SEITH, AIKEY ([id number]) Please see above for specific wound care orders. We will see patient for re-evaluation in 1 week(s) here in the clinic. If anything worsens or changes patient will contact our office for additional recommendations. Electronic Signature(s) Signed: 06/18/2018 5:17:02 PM By: Worthy Keeler PA-C Entered By: Worthy Keeler on 06/18/2018 16:58:43 Adam Merritt ([id number]) -------------------------------------------------------------------------------- ROS/PFSH  Details Patient Name: Adam Merritt Date of Service: 06/18/2018 11:15 AM Medical Record Number: [id number] Patient Account Number: [id number] Date of Birth/Sex: [birth year] (72 y.o. M) Treating RN: Montey Hora Primary Care Provider: [name] Other Clinician: Referring Provider: [name] Treating Provider/Extender: Melburn Hake, [name]: 10 Information Obtained From Patient Wound History Do you currently have one or more open woundso No Have you tested positive for osteomyelitis (bone infection)o No Have you had any tests for circulation on your legso Yes Constitutional Symptoms (General Health) Complaints and Symptoms: Negative for: Fever; Chills Cardiovascular Complaints and Symptoms: Positive for: LE edema Medical History: Positive for: Congestive Heart Failure; Coronary Artery Disease; Hypertension; Peripheral Venous Disease Negative for: Angina; Arrhythmia; Deep Vein Thrombosis; Hypotension; Myocardial Infarction; Peripheral Arterial Disease; Phlebitis; Vasculitis Past Medical History Notes: varicose veins Eyes Medical History: Negative for: Cataracts; Glaucoma; Optic Neuritis Ear/Nose/Mouth/Throat Medical History: Negative for: Chronic sinus problems/congestion; Middle ear problems Hematologic/Lymphatic Medical History: Positive for: Anemia - aplastic anemia; Lymphedema Negative for: Hemophilia; Human Immunodeficiency Virus; Sickle Cell Disease Respiratory Complaints and Symptoms: No Complaints or Symptoms  Medical History: Positive for: Chronic Obstructive Pulmonary Disease (COPD); Sleep Apnea Negative for: Aspiration; Asthma; Pneumothorax; Tuberculosis Past Medical History Notes: DETRON, CARRAS ([id number]) home O2 at night as needed Gastrointestinal Medical History: Negative for: Cirrhosis ; Colitis; Crohnos; Hepatitis A; Hepatitis B; Hepatitis C Endocrine Medical History: Positive for: Type II Diabetes Negative for: Type I  Diabetes Time with diabetes: since 2006 Treated with: Insulin Blood sugar tested every day: Yes Tested : Blood sugar testing results: Breakfast: 209 Genitourinary Medical History: Negative for: End Stage Renal Disease Immunological Medical History: Negative for: Lupus Erythematosus; Raynaudos; Scleroderma Integumentary (Skin) Medical History: Negative for: History of Burn; History of pressure wounds Musculoskeletal Medical History: Positive for: Osteoarthritis Negative for: Gout; Rheumatoid Arthritis; Osteomyelitis Neurologic Medical History: Positive for: Neuropathy Negative for: Dementia; Quadriplegia; Paraplegia; Seizure Disorder Past Medical History Notes: CVA - 1992 Oncologic Medical History: Positive for: Received Chemotherapy - last dose 2006 Negative for: Received Radiation Psychiatric Complaints and Symptoms: No Complaints or Symptoms Medical History: RAJAT, STAVER ([id number]) Negative for: Anorexia/bulimia; Confinement Anxiety Immunizations Pneumococcal Vaccine: Received Pneumococcal Vaccination: Yes Implantable Devices Hospitalization / Surgery History Name of Hospital Purpose of Hospitalization/Surgery Date [facility] bronchitis 11/03/2013 Family and Social History Cancer: Yes - Paternal Grandparents; Diabetes: No; Heart Disease: No; Hereditary Spherocytosis: No; Hypertension: No; Kidney Disease: Yes - Siblings; Lung Disease: Yes - Mother; Seizures: No; Stroke: No; Thyroid Problems: No; Tuberculosis: No; Never smoker; Marital Status - Married; Alcohol Use: Never; Drug Use: No History; Caffeine Use: Never; Financial Concerns: No; Food, Clothing or Shelter Needs: No; Support System Lacking: No; Transportation Concerns: No; Advanced Directives: No; Patient does not want information on Advanced Directives Physician Affirmation I have reviewed and agree with the above information. Electronic Signature(s) Signed: 06/18/2018 5:17:02 PM By: Worthy Keeler  PA-C Signed: 06/18/2018 5:22:36 PM By: Montey Hora Entered By: Worthy Keeler on 06/18/2018 16:58:15 Adam Merritt ([id number]) -------------------------------------------------------------------------------- SuperBill Details Patient Name: Adam Merritt Date of Service: 06/18/2018 Medical Record Number: [id number] Patient Account Number: [id number] Date of Birth/Sex: [birth year] (72 y.o. M) Treating RN: Montey Hora Primary Care Provider: [name] Other Clinician: Referring Provider: [name] Treating Provider/Extender: Melburn Hake, [name]: 10 Diagnosis Coding ICD-10 Codes Code Description I89.0 Lymphedema, not elsewhere classified L97.512 Non-pressure chronic ulcer of other part of right foot with fat layer exposed L97.822 Non-pressure chronic ulcer of other part of left lower leg with fat layer exposed L98.492 Non-pressure chronic ulcer of skin of other sites with fat layer exposed E66.9 Obesity, unspecified Facility Procedures CPT4 Code: 67209470 Description: 11042 - DEB SUBQ TISSUE 20 SQ CM/< ICD-10 Diagnosis Description L97.512 Non-pressure chronic ulcer of other part of right foot with fat Modifier: layer exposed Quantity: 1 Physician Procedures CPT4 Code: 9628366 Description: 11042 - WC PHYS SUBQ TISS 20 SQ CM ICD-10 Diagnosis Description L97.512 Non-pressure chronic ulcer of other part of right foot with fat Modifier: layer exposed Quantity: 1 Electronic Signature(s) Signed: 06/18/2018 5:17:02 PM By: Worthy Keeler PA-C Entered By: Worthy Keeler on 06/18/2018 16:58:58

## 2018-06-21 ENCOUNTER — Other Ambulatory Visit: Payer: Self-pay

## 2018-06-21 ENCOUNTER — Emergency Department: Payer: Medicare Other

## 2018-06-21 ENCOUNTER — Inpatient Hospital Stay
Admission: EM | Admit: 2018-06-21 | Discharge: 2018-06-25 | DRG: 854 | Disposition: A | Discharge status: Home / Self Care | Payer: Medicare Other | Attending: Internal Medicine | Admitting: Internal Medicine

## 2018-06-21 ENCOUNTER — Inpatient Hospital Stay: Payer: Medicare Other

## 2018-06-21 DIAGNOSIS — I110 Hypertensive heart disease with heart failure: Secondary | ICD-10-CM | POA: Diagnosis not present

## 2018-06-21 DIAGNOSIS — Z951 Presence of aortocoronary bypass graft: Secondary | ICD-10-CM

## 2018-06-21 DIAGNOSIS — N179 Acute kidney failure, unspecified: Secondary | ICD-10-CM | POA: Diagnosis present

## 2018-06-21 DIAGNOSIS — Z6841 Body Mass Index (BMI) 40.0 and over, adult: Secondary | ICD-10-CM

## 2018-06-21 DIAGNOSIS — E785 Hyperlipidemia, unspecified: Secondary | ICD-10-CM | POA: Diagnosis present

## 2018-06-21 DIAGNOSIS — M868X7 Other osteomyelitis, ankle and foot: Secondary | ICD-10-CM | POA: Diagnosis present

## 2018-06-21 DIAGNOSIS — N184 Chronic kidney disease, stage 4 (severe): Secondary | ICD-10-CM | POA: Diagnosis present

## 2018-06-21 DIAGNOSIS — I5032 Chronic diastolic (congestive) heart failure: Secondary | ICD-10-CM | POA: Diagnosis present

## 2018-06-21 DIAGNOSIS — Z89411 Acquired absence of right great toe: Secondary | ICD-10-CM | POA: Diagnosis not present

## 2018-06-21 DIAGNOSIS — E1122 Type 2 diabetes mellitus with diabetic chronic kidney disease: Secondary | ICD-10-CM | POA: Diagnosis present

## 2018-06-21 DIAGNOSIS — J449 Chronic obstructive pulmonary disease, unspecified: Secondary | ICD-10-CM | POA: Diagnosis present

## 2018-06-21 DIAGNOSIS — Z7982 Long term (current) use of aspirin: Secondary | ICD-10-CM

## 2018-06-21 DIAGNOSIS — L97519 Non-pressure chronic ulcer of other part of right foot with unspecified severity: Secondary | ICD-10-CM | POA: Diagnosis present

## 2018-06-21 DIAGNOSIS — I2510 Atherosclerotic heart disease of native coronary artery without angina pectoris: Secondary | ICD-10-CM | POA: Diagnosis present

## 2018-06-21 DIAGNOSIS — E11621 Type 2 diabetes mellitus with foot ulcer: Secondary | ICD-10-CM | POA: Diagnosis present

## 2018-06-21 DIAGNOSIS — E1140 Type 2 diabetes mellitus with diabetic neuropathy, unspecified: Secondary | ICD-10-CM | POA: Diagnosis present

## 2018-06-21 DIAGNOSIS — E1169 Type 2 diabetes mellitus with other specified complication: Secondary | ICD-10-CM | POA: Diagnosis present

## 2018-06-21 DIAGNOSIS — E876 Hypokalemia: Secondary | ICD-10-CM | POA: Diagnosis present

## 2018-06-21 DIAGNOSIS — K219 Gastro-esophageal reflux disease without esophagitis: Secondary | ICD-10-CM | POA: Diagnosis present

## 2018-06-21 DIAGNOSIS — I890 Lymphedema, not elsewhere classified: Secondary | ICD-10-CM | POA: Diagnosis not present

## 2018-06-21 DIAGNOSIS — R7881 Bacteremia: Secondary | ICD-10-CM | POA: Diagnosis not present

## 2018-06-21 DIAGNOSIS — L03116 Cellulitis of left lower limb: Secondary | ICD-10-CM

## 2018-06-21 DIAGNOSIS — Z8673 Personal history of transient ischemic attack (TIA), and cerebral infarction without residual deficits: Secondary | ICD-10-CM

## 2018-06-21 DIAGNOSIS — Z794 Long term (current) use of insulin: Secondary | ICD-10-CM | POA: Diagnosis not present

## 2018-06-21 DIAGNOSIS — Z791 Long term (current) use of non-steroidal anti-inflammatories (NSAID): Secondary | ICD-10-CM

## 2018-06-21 DIAGNOSIS — Z79899 Other long term (current) drug therapy: Secondary | ICD-10-CM

## 2018-06-21 DIAGNOSIS — G8929 Other chronic pain: Secondary | ICD-10-CM | POA: Diagnosis present

## 2018-06-21 DIAGNOSIS — L03115 Cellulitis of right lower limb: Secondary | ICD-10-CM | POA: Diagnosis not present

## 2018-06-21 DIAGNOSIS — M109 Gout, unspecified: Secondary | ICD-10-CM | POA: Diagnosis present

## 2018-06-21 DIAGNOSIS — R531 Weakness: Secondary | ICD-10-CM

## 2018-06-21 DIAGNOSIS — B951 Streptococcus, group B, as the cause of diseases classified elsewhere: Secondary | ICD-10-CM | POA: Diagnosis not present

## 2018-06-21 DIAGNOSIS — I872 Venous insufficiency (chronic) (peripheral): Secondary | ICD-10-CM | POA: Diagnosis present

## 2018-06-21 DIAGNOSIS — A401 Sepsis due to streptococcus, group B: Secondary | ICD-10-CM | POA: Diagnosis present

## 2018-06-21 DIAGNOSIS — I130 Hypertensive heart and chronic kidney disease with heart failure and stage 1 through stage 4 chronic kidney disease, or unspecified chronic kidney disease: Secondary | ICD-10-CM | POA: Diagnosis present

## 2018-06-21 DIAGNOSIS — I509 Heart failure, unspecified: Secondary | ICD-10-CM | POA: Diagnosis not present

## 2018-06-21 DIAGNOSIS — A419 Sepsis, unspecified organism: Secondary | ICD-10-CM | POA: Diagnosis present

## 2018-06-21 DIAGNOSIS — E872 Acidosis: Secondary | ICD-10-CM | POA: Diagnosis present

## 2018-06-21 DIAGNOSIS — B999 Unspecified infectious disease: Secondary | ICD-10-CM

## 2018-06-21 DIAGNOSIS — G4700 Insomnia, unspecified: Secondary | ICD-10-CM | POA: Diagnosis present

## 2018-06-21 DIAGNOSIS — N183 Chronic kidney disease, stage 3 (moderate): Secondary | ICD-10-CM | POA: Diagnosis present

## 2018-06-21 DIAGNOSIS — E1151 Type 2 diabetes mellitus with diabetic peripheral angiopathy without gangrene: Secondary | ICD-10-CM | POA: Diagnosis present

## 2018-06-21 DIAGNOSIS — D631 Anemia in chronic kidney disease: Secondary | ICD-10-CM | POA: Diagnosis present

## 2018-06-21 LAB — CBC WITH DIFFERENTIAL/PLATELET
Abs Immature Granulocytes: 0.2 10*3/uL — ABNORMAL HIGH (ref 0.00–0.07)
BASOS ABS: 0.1 10*3/uL (ref 0.0–0.1)
BASOS PCT: 0 %
EOS PCT: 0 %
Eosinophils Absolute: 0 10*3/uL (ref 0.0–0.5)
HCT: 39.9 % (ref 39.0–52.0)
Hemoglobin: 13.6 g/dL (ref 13.0–17.0)
Immature Granulocytes: 1 %
Lymphocytes Relative: 2 %
Lymphs Abs: 0.6 10*3/uL — ABNORMAL LOW (ref 0.7–4.0)
MCH: 32.3 pg (ref 26.0–34.0)
MCHC: 34.1 g/dL (ref 30.0–36.0)
MCV: 94.8 fL (ref 80.0–100.0)
MONO ABS: 0.9 10*3/uL (ref 0.1–1.0)
Monocytes Relative: 3 %
NEUTROS ABS: 25.5 10*3/uL — AB (ref 1.7–7.7)
NRBC: 0 % (ref 0.0–0.2)
Neutrophils Relative %: 94 %
Platelets: 202 10*3/uL (ref 150–400)
RBC: 4.21 MIL/uL — AB (ref 4.22–5.81)
RDW: 12.5 % (ref 11.5–15.5)
SMEAR REVIEW: NORMAL
WBC: 27.3 10*3/uL — AB (ref 4.0–10.5)

## 2018-06-21 LAB — GLUCOSE, CAPILLARY: Glucose-Capillary: 191 mg/dL — ABNORMAL HIGH (ref 70–99)

## 2018-06-21 LAB — COMPREHENSIVE METABOLIC PANEL
ALBUMIN: 3.7 g/dL (ref 3.5–5.0)
ALK PHOS: 71 U/L (ref 38–126)
ALT: 20 U/L (ref 0–44)
ANION GAP: 11 (ref 5–15)
AST: 33 U/L (ref 15–41)
BUN: 29 mg/dL — ABNORMAL HIGH (ref 8–23)
CALCIUM: 8.6 mg/dL — AB (ref 8.9–10.3)
CO2: 28 mmol/L (ref 22–32)
Chloride: 95 mmol/L — ABNORMAL LOW (ref 98–111)
Creatinine, Ser: 2.19 mg/dL — ABNORMAL HIGH (ref 0.61–1.24)
GFR, EST AFRICAN AMERICAN: 33 mL/min — AB (ref >60)
GFR, EST NON AFRICAN AMERICAN: 29 mL/min — AB (ref >60)
GLUCOSE: 176 mg/dL — AB (ref 70–99)
POTASSIUM: 3.4 mmol/L — AB (ref 3.5–5.1)
Sodium: 134 mmol/L — ABNORMAL LOW (ref 135–145)
TOTAL PROTEIN: 7.4 g/dL (ref 6.5–8.1)
Total Bilirubin: 1.3 mg/dL — ABNORMAL HIGH (ref 0.3–1.2)

## 2018-06-21 LAB — INFLUENZA PANEL BY PCR (TYPE A & B)
Influenza A By PCR: NEGATIVE
Influenza B By PCR: NEGATIVE

## 2018-06-21 LAB — CREATININE, SERUM
Creatinine, Ser: 2.19 mg/dL — ABNORMAL HIGH (ref 0.61–1.24)
GFR calc Af Amer: 33 mL/min — ABNORMAL LOW (ref >60)
GFR calc non Af Amer: 29 mL/min — ABNORMAL LOW (ref >60)

## 2018-06-21 LAB — PROCALCITONIN: Procalcitonin: 1.17 ng/mL

## 2018-06-21 LAB — URINALYSIS, COMPLETE (UACMP) WITH MICROSCOPIC
Bilirubin Urine: NEGATIVE
Glucose, UA: NEGATIVE mg/dL
Hgb urine dipstick: NEGATIVE
Ketones, ur: NEGATIVE mg/dL
LEUKOCYTES UA: NEGATIVE
Nitrite: NEGATIVE
PROTEIN: NEGATIVE mg/dL
SPECIFIC GRAVITY, URINE: 1.009 (ref 1.005–1.030)
pH: 5 (ref 5.0–8.0)

## 2018-06-21 LAB — LACTIC ACID, PLASMA: LACTIC ACID, VENOUS: 2 mmol/L — AB (ref 0.5–1.9)

## 2018-06-21 LAB — PROTIME-INR
INR: 1.14
Prothrombin Time: 14.5 seconds (ref 11.4–15.2)

## 2018-06-21 LAB — APTT: aPTT: 44 seconds — ABNORMAL HIGH (ref 24–36)

## 2018-06-21 MED ORDER — ASPIRIN EC 325 MG PO TBEC
325.0000 mg | DELAYED_RELEASE_TABLET | Freq: Every day | ORAL | Status: DC
  Administered 2018-06-21 – 2018-06-25 (×5): 325 mg via ORAL
  Filled 2018-06-21 (×5): qty 1

## 2018-06-21 MED ORDER — PENTOXIFYLLINE ER 400 MG PO TBCR
400.0000 mg | EXTENDED_RELEASE_TABLET | Freq: Three times a day (TID) | ORAL | Status: DC
  Administered 2018-06-21 – 2018-06-25 (×11): 400 mg via ORAL
  Filled 2018-06-21 (×14): qty 1

## 2018-06-21 MED ORDER — INSULIN ASPART 100 UNIT/ML ~~LOC~~ SOLN
0.0000 [IU] | Freq: Three times a day (TID) | SUBCUTANEOUS | Status: DC
  Administered 2018-06-22: 17:00:00 1 [IU] via SUBCUTANEOUS
  Administered 2018-06-22 (×2): 2 [IU] via SUBCUTANEOUS
  Administered 2018-06-23 – 2018-06-24 (×3): 1 [IU] via SUBCUTANEOUS
  Administered 2018-06-24: 2 [IU] via SUBCUTANEOUS
  Administered 2018-06-25: 08:00:00 1 [IU] via SUBCUTANEOUS
  Administered 2018-06-25: 12:00:00 2 [IU] via SUBCUTANEOUS
  Filled 2018-06-21 (×9): qty 1

## 2018-06-21 MED ORDER — PANTOPRAZOLE SODIUM 40 MG PO TBEC
40.0000 mg | DELAYED_RELEASE_TABLET | Freq: Every day | ORAL | Status: DC
  Administered 2018-06-22 – 2018-06-25 (×4): 40 mg via ORAL
  Filled 2018-06-21 (×4): qty 1

## 2018-06-21 MED ORDER — INSULIN GLARGINE 100 UNIT/ML ~~LOC~~ SOLN
30.0000 [IU] | Freq: Every day | SUBCUTANEOUS | Status: DC
  Administered 2018-06-22 – 2018-06-25 (×4): 30 [IU] via SUBCUTANEOUS
  Filled 2018-06-21 (×6): qty 0.3

## 2018-06-21 MED ORDER — FEBUXOSTAT 40 MG PO TABS
40.0000 mg | ORAL_TABLET | Freq: Every day | ORAL | Status: DC
  Administered 2018-06-22 – 2018-06-25 (×4): 40 mg via ORAL
  Filled 2018-06-21 (×5): qty 1

## 2018-06-21 MED ORDER — BISACODYL 5 MG PO TBEC
5.0000 mg | DELAYED_RELEASE_TABLET | Freq: Every day | ORAL | Status: DC | PRN
  Administered 2018-06-24: 5 mg via ORAL
  Filled 2018-06-21: qty 1

## 2018-06-21 MED ORDER — SODIUM CHLORIDE 0.9 % IV BOLUS
1000.0000 mL | Freq: Once | INTRAVENOUS | Status: AC
  Administered 2018-06-21: 1000 mL via INTRAVENOUS

## 2018-06-21 MED ORDER — DOCUSATE SODIUM 50 MG/5ML PO LIQD
50.0000 mg | Freq: Two times a day (BID) | ORAL | Status: DC
  Administered 2018-06-22 – 2018-06-23 (×2): 50 mg via ORAL
  Filled 2018-06-21 (×9): qty 10

## 2018-06-21 MED ORDER — ONDANSETRON HCL 4 MG PO TABS: 4.0000 mg | ORAL_TABLET | Freq: Four times a day (QID) | ORAL | Status: DC | PRN

## 2018-06-21 MED ORDER — SODIUM CHLORIDE 0.9 % IV SOLN
Freq: Three times a day (TID) | INTRAVENOUS | Status: DC
  Administered 2018-06-21 – 2018-06-22 (×2): via INTRAVENOUS
  Filled 2018-06-21 (×3): qty 4.5

## 2018-06-21 MED ORDER — GABAPENTIN 300 MG PO CAPS
600.0000 mg | ORAL_CAPSULE | Freq: Every day | ORAL | Status: DC
  Administered 2018-06-21 – 2018-06-24 (×4): 600 mg via ORAL
  Filled 2018-06-21 (×4): qty 2

## 2018-06-21 MED ORDER — DOCUSATE SODIUM 50 MG PO CAPS
50.0000 mg | ORAL_CAPSULE | Freq: Two times a day (BID) | ORAL | Status: DC
  Filled 2018-06-21: qty 1

## 2018-06-21 MED ORDER — HYDROCODONE-ACETAMINOPHEN 5-325 MG PO TABS
1.0000 | ORAL_TABLET | ORAL | Status: DC | PRN
  Administered 2018-06-22: 1 via ORAL
  Administered 2018-06-23 – 2018-06-25 (×7): 2 via ORAL
  Filled 2018-06-21: qty 1
  Filled 2018-06-21 (×7): qty 2

## 2018-06-21 MED ORDER — HEPARIN SODIUM (PORCINE) 5000 UNIT/ML IJ SOLN
5000.0000 [IU] | Freq: Three times a day (TID) | INTRAMUSCULAR | Status: DC
  Administered 2018-06-22 – 2018-06-25 (×7): 5000 [IU] via SUBCUTANEOUS
  Filled 2018-06-21 (×7): qty 1

## 2018-06-21 MED ORDER — GABAPENTIN 300 MG PO CAPS
300.0000 mg | ORAL_CAPSULE | Freq: Two times a day (BID) | ORAL | Status: DC
  Administered 2018-06-22 – 2018-06-25 (×7): 300 mg via ORAL
  Filled 2018-06-21 (×6): qty 1

## 2018-06-21 MED ORDER — ATORVASTATIN CALCIUM 20 MG PO TABS
40.0000 mg | ORAL_TABLET | Freq: Every day | ORAL | Status: DC
  Administered 2018-06-21 – 2018-06-24 (×4): 40 mg via ORAL
  Filled 2018-06-21 (×4): qty 2

## 2018-06-21 MED ORDER — VANCOMYCIN HCL IN DEXTROSE 1-5 GM/200ML-% IV SOLN
1000.0000 mg | Freq: Once | INTRAVENOUS | Status: AC
  Administered 2018-06-21: 1000 mg via INTRAVENOUS
  Filled 2018-06-21: qty 200

## 2018-06-21 MED ORDER — SODIUM CHLORIDE 0.9 % IV SOLN
1.0000 g | Freq: Once | INTRAVENOUS | Status: AC
  Administered 2018-06-21: 1 g via INTRAVENOUS
  Filled 2018-06-21: qty 10

## 2018-06-21 MED ORDER — VANCOMYCIN HCL 10 G IV SOLR: 1750.0000 mg | INTRAVENOUS | Status: DC

## 2018-06-21 MED ORDER — ONDANSETRON HCL 4 MG/2ML IJ SOLN: 4.0000 mg | Freq: Four times a day (QID) | INTRAMUSCULAR | Status: DC | PRN

## 2018-06-21 MED ORDER — ACETAMINOPHEN 325 MG PO TABS
650.0000 mg | ORAL_TABLET | Freq: Four times a day (QID) | ORAL | Status: DC | PRN
  Administered 2018-06-24: 650 mg via ORAL
  Filled 2018-06-21: qty 2

## 2018-06-21 MED ORDER — ACETAMINOPHEN 650 MG RE SUPP: 650.0000 mg | Freq: Four times a day (QID) | RECTAL | Status: DC | PRN

## 2018-06-21 MED ORDER — SENNOSIDES-DOCUSATE SODIUM 8.6-50 MG PO TABS
1.0000 | ORAL_TABLET | Freq: Two times a day (BID) | ORAL | Status: DC
  Administered 2018-06-21: 23:00:00 1 via ORAL
  Administered 2018-06-22: 2 via ORAL
  Administered 2018-06-22 – 2018-06-24 (×4): 1 via ORAL
  Administered 2018-06-25: 2 via ORAL
  Filled 2018-06-21 (×2): qty 1
  Filled 2018-06-21: qty 2
  Filled 2018-06-21: qty 1
  Filled 2018-06-21: qty 2
  Filled 2018-06-21 (×2): qty 1
  Filled 2018-06-21: qty 2

## 2018-06-21 MED ORDER — POTASSIUM CHLORIDE CRYS ER 20 MEQ PO TBCR
20.0000 meq | EXTENDED_RELEASE_TABLET | Freq: Every day | ORAL | Status: DC
  Administered 2018-06-22: 09:00:00 20 meq via ORAL
  Filled 2018-06-21: qty 1

## 2018-06-21 MED ORDER — ZOLPIDEM TARTRATE 5 MG PO TABS
5.0000 mg | ORAL_TABLET | Freq: Every evening | ORAL | Status: DC | PRN
  Administered 2018-06-21 – 2018-06-24 (×4): 5 mg via ORAL
  Filled 2018-06-21 (×4): qty 1

## 2018-06-21 MED ORDER — ALBUTEROL SULFATE (2.5 MG/3ML) 0.083% IN NEBU: 2.5000 mg | INHALATION_SOLUTION | RESPIRATORY_TRACT | Status: DC | PRN

## 2018-06-21 MED ORDER — SODIUM CHLORIDE 0.9 % IV SOLN
INTRAVENOUS | Status: DC
  Administered 2018-06-21: 23:00:00 via INTRAVENOUS

## 2018-06-21 MED ORDER — PIPERACILLIN-TAZOBACTAM 3.375 G IVPB 30 MIN
3.3750 g | Freq: Once | INTRAVENOUS | Status: AC
  Administered 2018-06-21: 3.375 g via INTRAVENOUS
  Filled 2018-06-21: qty 50

## 2018-06-21 MED ORDER — FENTANYL 50 MCG/HR TD PT72
1.0000 | MEDICATED_PATCH | TRANSDERMAL | Status: DC
  Administered 2018-06-21 – 2018-06-24 (×2): 1 via TRANSDERMAL
  Filled 2018-06-21 (×2): qty 1

## 2018-06-21 MED ORDER — SENNOSIDES-DOCUSATE SODIUM 8.6-50 MG PO TABS: 1.0000 | ORAL_TABLET | Freq: Every evening | ORAL | Status: DC | PRN

## 2018-06-21 MED ORDER — INDAPAMIDE 2.5 MG PO TABS
2.5000 mg | ORAL_TABLET | Freq: Every day | ORAL | Status: DC
  Administered 2018-06-22 – 2018-06-25 (×4): 2.5 mg via ORAL
  Filled 2018-06-21 (×5): qty 1

## 2018-06-21 MED ORDER — INSULIN ASPART 100 UNIT/ML ~~LOC~~ SOLN: 0.0000 [IU] | Freq: Every day | SUBCUTANEOUS | Status: DC

## 2018-06-21 NOTE — ED Triage Notes (Signed)
Patient arrives from home via EMS with c/o weakness since last night, unable to walk today. Patient reports bilateral leg pain. Patient sees wound care to get legs wrapped. 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 BGL 207. 20g IV in right hand.

## 2018-06-21 NOTE — ED Provider Notes (Signed)
[facility] Emergency Department Provider Note  ____________________________________________  Time seen: Approximately 4:59 PM  I have reviewed the triage vital signs and the nursing notes.   HISTORY  Chief Complaint Weakness and Leg Pain   HPI Adam Merritt is a 73 y.o. male with a history of anemia, diabetes, CAD, diastolic CHF, hypertension, hyperlipidemia, chronic lymphedema, neuropathy who presents for evaluation of generalized weakness.  Patient reports progressively worsening generalized weakness for the last 3 days.  Last night he was feeling very weak and moved from his bed to the recliner.  This morning he was unable to get up from the recliner.  Reports feeling extremely weak.  The weakness is generalized.  He has pain in both his legs however that is chronic in nature.  He has ulcerations on both legs and is followed by the wound clinic.  Today saw a wound nurse who came to change the dressing.  He denies fever, chills, nausea, vomiting, diarrhea, flulike symptoms, chest pain or shortness of breath, abdominal pain, dysuria or hematuria.  He reports decreased appetite and has not had much to eat or drink over the last 24 hours.   Past Medical History:  Diagnosis Date  . Anemia   . CAD (coronary artery disease)   . Chest pain   . Coronary artery disease   . Diabetes mellitus without complication ([place])   . Diastolic CHF ([place])   . Esophageal reflux   . Herpes zoster without mention of complication   . Hyperlipidemia   . Hypertension   . Insomnia   . Lumbago   . Lymphedema   . Neuropathy in diabetes ([place])   . Obstructive chronic bronchitis without exacerbation ([place])   . Other malaise and fatigue   . Stroke ([place])   . Varicose veins     Patient Active Problem List   Diagnosis Date Noted  . COPD (chronic obstructive pulmonary disease) ([place]) 05/13/2018  . Dysuria 05/05/2018  . Encounter for general adult medical examination with abnormal  findings 05/05/2018  . CHF exacerbation ([place]) 04/16/2018  . Achilles tendon disorder, right 04/02/2018  . Essential hypertension 01/28/2018  . Lymphedema of both lower extremities 01/28/2018  . Cellulitis and abscess of lower extremity 09/20/2017  . Edema of both lower legs due to peripheral venous insufficiency 09/20/2017  . Cellulitis 09/20/2017  . Atopic dermatitis 09/02/2017  . Chronic pain disorder 09/02/2017  . AKI (acute kidney injury) ([place]) 08/29/2017  . Other malaise and fatigue 07/09/2017  . CVA (cerebral vascular accident) ([place]) 09/15/2016  . 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 CVA (cerebral infarction) 02/12/2015  . Chronic diastolic CHF (congestive heart failure), NYHA class 3 ([place]) 11/30/2014  . Benign essential hypertension 11/06/2014  . CAD (coronary artery disease) of artery bypass graft 01/19/2014  . CKD (chronic kidney disease), stage III ([place]) 01/19/2014  . Acute bronchitis with COPD ([place]) 01/19/2014  . Diabetic neuropathy ([place]) 01/19/2014  . Insulin dependent diabetes mellitus ([place]) 01/19/2014  . OSA (obstructive sleep apnea) 01/19/2014  . Mixed hyperlipidemia 10/29/2013  . Peripheral vascular disease, unspecified ([place]) 09/29/2013    Past Surgical History:  Procedure Laterality Date  . CHOLECYSTECTOMY    . CORONARY ARTERY BYPASS GRAFT      Prior to Admission medications   Medication Sig Start Date End Date Taking? Authorizing Provider  albuterol (PROVENTIL HFA;VENTOLIN HFA) 108 (90 Base) MCG/ACT inhaler INHALE 1 PUFF INTO THE LUNGS EVERY 6 HOURS AS NEEDED FOR WHEEZING OR SHORTNESS OF BREATH Patient taking  differently: Inhale 1 puff into the lungs every 6 (six) hours as needed for wheezing or shortness of breath.  03/11/18   Allyne Gee, MD  aspirin 325 MG EC tablet Take 325 mg by mouth daily.    [provider]  atorvastatin (LIPITOR) 40 MG tablet Take 40 mg by mouth at bedtime.     [provider]  carvedilol (COREG) 25 MG tablet  Take 25 mg by mouth 2 (two) times daily with a meal.    [provider]  colchicine 0.6 MG tablet Take 1 tablet (0.6 mg total) by mouth daily. Patient not taking: Reported on 06/13/2018 08/31/17 08/31/18  Dustin Flock, MD  docusate sodium (COLACE) 50 MG capsule Take 50 mg by mouth 2 (two) times daily.    [provider]  febuxostat (ULORIC) 40 MG tablet Take 1 tablet (40 mg total) by mouth daily. 02/12/18   [name], MD  fentaNYL (DURAGESIC - DOSED MCG/HR) 50 MCG/HR Place 1 patch (50 mcg total) onto the skin every 3 (three) days. 05/27/18   Ronnell Freshwater, NP  furosemide (LASIX) 40 MG tablet Take 1 tablet (40 mg total) by mouth daily. 04/18/18   Max Sane, MD  gabapentin (NEURONTIN) 300 MG capsule Take 1 capsule po in AM and 1 capsule po in pm. Take 2 capsules po at bedtime. 05/27/18   Ronnell Freshwater, NP  hydrALAZINE (APRESOLINE) 25 MG tablet TAKE 1 TABLET BY MOUTH THREE TIMES DAILY FOR BLOOD PRESSURE Patient taking differently: Take 25 mg by mouth 3 (three) times daily.  02/12/18   [name], MD  ibuprofen (ADVIL,MOTRIN) 600 MG tablet Take 600 mg by mouth 2 (two) times daily as needed for mild pain or moderate pain.     [provider]  indapamide (LOZOL) 2.5 MG tablet Take 1 tablet (2.5 mg total) by mouth daily. 11/27/17   [name], MD  Insulin Glargine (BASAGLAR KWIKPEN) 100 UNIT/ML SOPN INJECT 30 TO 36 UNITS UNDER THE SKIN EVERY DAY Patient taking differently: Inject 30-36 Units into the skin daily.  03/26/18   Scarboro, Audie Clear, NP  ipratropium-albuterol (DUONEB) 0.5-2.5 (3) MG/3ML SOLN Take 3 mLs by nebulization every 4 (four) hours as needed (for shortness of breath). 09/10/17   [name], MD  Lancet Devices (ONE TOUCH DELICA LANCING DEV) MISC Use as directed 05/01/18   Ronnell Freshwater, NP  liraglutide (VICTOZA) 18 MG/3ML SOPN Inject 0.3 mLs (1.8 mg total) into the skin daily. 05/23/18   Ronnell Freshwater, NP  metolazone (ZAROXOLYN) 2.5 MG  tablet Take 1 tablet (2.5 mg total) by mouth daily. Patient not taking: Reported on 06/13/2018 02/12/18   [name], MD  Multiple Vitamin (MULTIVITAMIN WITH MINERALS) TABS tablet Take 1 tablet by mouth daily.    [provider]  omeprazole (PRILOSEC) 20 MG capsule Take 20 mg by mouth daily.    [provider]  Jonetta Speak LANCETS 32G MISC Use twice daily diag e11.65 05/01/18   Ronnell Freshwater, NP  Oxycodone HCl 10 MG TABS Half to one tab as needed prn for pain Patient not taking: Reported on 06/13/2018 11/28/17   [name], MD  pentoxifylline (TRENTAL) 400 MG CR tablet TAKE 1 TABLET BY MOUTH THREE TIMES DAILY FOR CLAUDICATION Patient taking differently: Take 400 mg by mouth 3 (three) times daily.  03/11/18   Ronnell Freshwater, NP  potassium chloride SA (K-DUR,KLOR-CON) 20 MEQ tablet Take 2 tablets (40 mEq total)  by mouth 2 (two) times daily. 06/13/18   Alisa Graff, FNP  sennosides-docusate sodium (SENOKOT-S) 8.6-50 MG tablet Take 1-2 tablets by mouth 2 (two) times daily.    [provider]  UREA 20 INTENSIVE HYDRATING 20 % cream USE AS DIRECTED 3 TIMES A WEEK 09/12/17   [name], MD  zolpidem (AMBIEN CR) 12.5 MG CR tablet Take 1 tablet (12.5 mg total) by mouth at bedtime as needed for sleep. 03/08/18   Kendell Bane, NP    Allergies Patient has no known allergies.  Family History  Problem Relation Age of Onset  . Diabetes Mother   . Hyperlipidemia Mother   . Hypertension Mother   . Diabetes Father   . Hyperlipidemia Father   . Hypertension Father   . CAD Father     Social History Social History   Tobacco Use  . Smoking status: Never Smoker  . Smokeless tobacco: Never Used  Substance Use Topics  . Alcohol use: No    Comment: quit alcohol 30 years ago  . Drug use: No    Review of Systems  Constitutional: Negative for fever. + generalized weakness Eyes: Negative for visual changes. ENT: Negative for sore throat. Neck: No neck pain    Cardiovascular: Negative for chest pain. Respiratory: Negative for shortness of breath. Gastrointestinal: Negative for abdominal pain, vomiting or diarrhea. + decreased appetite Genitourinary: Negative for dysuria. Musculoskeletal: Negative for back pain. Skin: Negative for rash. Neurological: Negative for headaches, weakness or numbness. Psych: No SI or HI  ____________________________________________   PHYSICAL EXAM:  VITAL SIGNS: ED Triage Vitals  Enc Vitals Group     BP 06/21/18 1614 107/65     Pulse Rate 06/21/18 1614 62     Resp 06/21/18 1614 16     Temp 06/21/18 1614 98 F (36.7 C)     Temp Source 06/21/18 1614 Oral     SpO2 06/21/18 1614 98 %     Weight 06/21/18 1622 290 lb (131.5 kg)     Height 06/21/18 1622 5\' 6"  (1.676 m)     Head Circumference --      Peak Flow --      Pain Score 06/21/18 1622 8     Pain Loc --      Pain Edu? --      Excl. in [place]? --     Constitutional: Alert and oriented, chronically ill appearing, no distress. HEENT:      Head: Normocephalic and atraumatic.         Eyes: Conjunctivae are normal. Sclera is non-icteric.       Mouth/Throat: Mucous membranes are dry.       Neck: Supple with no signs of meningismus. Cardiovascular: Regular rate and rhythm. No murmurs, gallops, or rubs. 2+ symmetrical distal pulses are present in all extremities. No JVD. Respiratory: Normal respiratory effort. Lungs are clear to auscultation bilaterally. No wheezes, crackles, or rhonchi.  Gastrointestinal: Soft, non tender, and non distended with positive bowel sounds. No rebound or guarding. Musculoskeletal: Changes of bilateral venous stasis with several ulcerations on the anterior aspect of the left lower extremity with overlying warmth and erythema  neurologic: Normal speech and language. Face is symmetric. Moving all extremities. No gross focal neurologic deficits are appreciated. Skin: Skin is warm, dry and intact.  Psychiatric: Mood and affect are normal.  Speech and behavior are normal.  ____________________________________________   LABS (all labs ordered are listed, but only abnormal results are displayed)  Labs Reviewed  CBC WITH DIFFERENTIAL/PLATELET - Abnormal; Notable for the following components:      Result Value   WBC 27.3 (*)    RBC 4.21 (*)    Neutro Abs 25.5 (*)    Lymphs Abs 0.6 (*)    Abs Immature Granulocytes 0.20 (*)    All other components within normal limits  COMPREHENSIVE METABOLIC PANEL - Abnormal; Notable for the following components:   Sodium 134 (*)    Potassium 3.4 (*)    Chloride 95 (*)    Glucose, Bld 176 (*)    BUN 29 (*)    Creatinine, Ser 2.19 (*)    Calcium 8.6 (*)    Total Bilirubin 1.3 (*)    GFR calc non Af Amer 29 (*)    GFR calc Af Amer 33 (*)    All other components within normal limits  LACTIC ACID, PLASMA - Abnormal; Notable for the following components:   Lactic Acid, Venous 2.0 (*)    All other components within normal limits  URINALYSIS, COMPLETE (UACMP) WITH MICROSCOPIC - Abnormal; Notable for the following components:   Color, Urine YELLOW (*)    APPearance CLEAR (*)    Bacteria, UA RARE (*)    All other components within normal limits  CULTURE, BLOOD (ROUTINE X 2)  CULTURE, BLOOD (ROUTINE X 2)  INFLUENZA PANEL BY PCR (TYPE A & B)   ____________________________________________  EKG  ED ECG REPORT I, Rudene Re, the attending physician, personally viewed and interpreted this ECG.  Normal sinus rhythm, rate of 96, normal PR and QRS intervals, prolonged QTC, normal axis, no ST elevations or depressions.  Prolonged QTC is new when compared to prior but no other significant changes. ____________________________________________  RADIOLOGY  I have personally reviewed the images performed during this visit and I agree with the Radiologist's read.   Interpretation by Radiologist:  Dg Chest 2 View  Result Date: 06/21/2018 CLINICAL DATA:  Weakness unable walk since last  night EXAM: CHEST - 2 VIEW COMPARISON:  April 16, 2018 FINDINGS: The heart size and mediastinal contours are stable. There is no focal infiltrate, pulmonary edema, or pleural effusion. The visualized skeletal structures are unremarkable. IMPRESSION: No active cardiopulmonary disease. Electronically Signed   By: Abelardo Diesel M.D.   On: 06/21/2018 18:07      ____________________________________________   PROCEDURES  Procedure(s) performed: None Procedures Critical Care performed: yes  CRITICAL CARE Performed by: Rudene Re  ?  Total critical care time: 30 min  Critical care time was exclusive of separately billable procedures and treating other patients.  Critical care was necessary to treat or prevent imminent or life-threatening deterioration.  Critical care was time spent personally by me on the following activities: development of treatment plan with patient and/or surrogate as well as nursing, discussions with consultants, evaluation of patient's response to treatment, examination of patient, obtaining history from patient or surrogate, ordering and performing treatments and interventions, ordering and review of laboratory studies, ordering and review of radiographic studies, pulse oximetry and re-evaluation of patient's condition.  ____________________________________________   INITIAL IMPRESSION / ASSESSMENT AND PLAN / ED COURSE   73 y.o. male with a history of anemia, diabetes, CAD, diastolic CHF, hypertension, hyperlipidemia, chronic lymphedema, neuropathy who presents for evaluation of generalized weakness x 3 days and today unable to stand up. No localizing neuro deficits. EKG with no ischemic changes. Patient has ulceration on his LLE with overlying cellulitis. No signs of sepsis based on vitals. Patient looks dry on exam, will  give IVF.  Labs are pending to rule out anemia, AKI, electrolyte abnormalities, sepsis  Clinical Course as of Jun 22 1815  Fri Jun 21, 2018  1816 WBC of 27K with lactic 2.0 concerning for sepsis. Vitals WNL. Source most likely cellulitis. Flu, UA and CXR negative.   [CV]    Clinical Course User Index [CV] Alfred Levins [place], MD     As part of my medical decision making, I reviewed the following data within the [place] notes reviewed and incorporated, Labs reviewed , EKG interpreted , Old chart reviewed, Radiograph reviewed , Discussed with admitting physician , Notes from prior ED visits and [place] Controlled Substance Database    Pertinent labs & imaging results that were available during my care of the patient were reviewed by me and considered in my medical decision making (see chart for details).    ____________________________________________   FINAL CLINICAL IMPRESSION(S) / ED DIAGNOSES  Final diagnoses:  Cellulitis of left lower extremity  Generalized weakness  Sepsis without acute organ dysfunction, due to unspecified organism [facility])      NEW MEDICATIONS STARTED DURING THIS VISIT:  ED Discharge Orders    None       Note:  This document was prepared using Dragon voice recognition software and may include unintentional dictation errors.    Alfred Levins, [place], MD 06/21/18 [phone]

## 2018-06-21 NOTE — Progress Notes (Signed)
ALMIR, BOTTS ([id number]) Visit Report for 06/18/2018 Arrival Information Details Patient Name: Adam Merritt, Adam Merritt Date of Service: 06/18/2018 11:15 AM Medical Record Number: [id number] Patient Account Number: [id number] Date of Birth/Sex: 10-Mar-1946 (72 y.o. M) Treating RN: Montey Hora Primary Care Joee Iovine: [name] Other Clinician: Referring Nichlas Pitera: [name] Treating Tomio Kirk/Extender: Melburn Hake, [name]: 10 Visit Information History Since Last Visit Added or deleted any medications: Yes Patient Arrived: Cane Any new allergies or adverse reactions: No Arrival Time: 11:18 Had a fall or experienced change in No Accompanied By: wife activities of daily living that may affect Transfer Assistance: None risk of falls: Patient Identification Verified: Yes Signs or symptoms of abuse/neglect since last visito No Secondary Verification Process Yes Hospitalized since last visit: No Completed: Implantable device outside of the clinic excluding No Patient Has Alerts: Yes cellular tissue based products placed in the center Patient Alerts: Patient on Blood since last visit: Thinner Has Dressing in Place as Prescribed: Yes Aspirin 325mg  Pain Present Now: Yes Type II Diabetes ABI 5/19 L 1.1 R 0.9 TBI L 0.6 R 0.6 Electronic Signature(s) Signed: 06/18/2018 1:47:13 PM By: Lorine Bears RCP, RRT, CHT Entered By: Lorine Bears on 06/18/2018 11:18:59 Adam Merritt ([id number]) -------------------------------------------------------------------------------- Encounter Discharge Information Details Patient Name: Adam Merritt Date of Service: 06/18/2018 11:15 AM Medical Record Number: [id number] Patient Account Number: [id number] Date of Birth/Sex: 1945/09/27 (72 y.o. M) Treating RN: Montey Hora Primary Care Jaleil Renwick: [name] Other Clinician: Referring Tukker Byrns: [name] Treating Jovany Disano/Extender: Melburn Hake, [name] in  Treatment: 10 Encounter Discharge Information Items Post Procedure Vitals Discharge Condition: Stable Temperature (F): 97.9 Ambulatory Status: Cane Pulse (bpm): 69 Discharge Destination: Home Respiratory Rate (breaths/min): 16 Transportation: Private Auto Blood Pressure (mmHg): 153/68 Accompanied By: spouse Schedule Follow-up Appointment: Yes Clinical Summary of Care: Electronic Signature(s) Signed: 06/18/2018 5:22:36 PM By: Montey Hora Entered By: Montey Hora on 06/18/2018 12:13:31 Adam Merritt ([id number]) -------------------------------------------------------------------------------- Lower Extremity Assessment Details Patient Name: Adam Merritt Date of Service: 06/18/2018 11:15 AM Medical Record Number: [id number] Patient Account Number: [id number] Date of Birth/Sex: [birth year] (72 y.o. M) Treating RN: Harold Barban Primary Care Rasa Degrazia: [name] Other Clinician: Referring Yunuen Mordan: [name] Treating Keshun Berrett/Extender: Melburn Hake, [name]: 10 Edema Assessment Assessed: [Left: No] [Right: No] Edema: [Left: Yes] [Right: Yes] Calf Left: Right: Point of Measurement: 35 cm From Medial Instep 51 cm 47 cm Ankle Left: Right: Point of Measurement: 33 cm From Medial Instep 41.5 cm 28.5 cm Vascular Assessment Pulses: Dorsalis Pedis Palpable: [Left:No] [Right:No] Posterior Tibial Palpable: [Left:No] [Right:No] Toe Nail Assessment Left: Right: Thick: Yes Yes Discolored: Yes Yes Deformed: Yes Yes Improper Length and Hygiene: No No Electronic Signature(s) Signed: 06/20/2018 4:21:01 PM By: Harold Barban Entered By: Harold Barban on 06/18/2018 12:00:18 Adam Merritt ([id number]) -------------------------------------------------------------------------------- Multi Wound Chart Details Patient Name: Adam Merritt Date of Service: 06/18/2018 11:15 AM Medical Record Number: [id number] Patient Account Number: [id number] Date of Birth/Sex:  [birth year] (72 y.o. M) Treating RN: Montey Hora Primary Care Cherilyn Sautter: [name] Other Clinician: Referring Bonnetta Allbee: [name] Treating Ryla Cauthon/Extender: Melburn Hake, [name]: 10 Vital Signs Height(in): 66 Pulse(bpm): 64 Weight(lbs): 323 Blood Pressure(mmHg): 153/68 Body Mass Index(BMI): 52 Temperature(F): 97.9 Respiratory Rate 18 (breaths/min): Photos: [14:No Photos] [15:No Photos] [16:No Photos] Wound Location: [14:Right Toe Great] [15:Left Lower Leg] [16:Lower Leg - Lateral] Wounding Event: [14:Trauma] [15:Trauma] [16:Blister] Primary Etiology: [14:Trauma, Other] [15:Trauma, Other] [16:Venous Leg Ulcer] Comorbid History: [14:Anemia, Lymphedema, Chronic Obstructive  Pulmonary Disease (COPD), Sleep Apnea, Congestive Heart Failure, Coronary Artery Disease, Hypertension, Peripheral Venous Disease, Type II Diabetes, Osteoarthritis, Neuropathy, Received  Chemotherapy] [15:Anemia, Lymphedema, Chronic Obstructive Pulmonary Disease (COPD), Sleep Apnea, Congestive Heart Failure, Coronary Artery Disease, Hypertension, Peripheral Venous Disease, Type II Diabetes, Osteoarthritis, Neuropathy, Received  Chemotherapy] [16:Anemia, Lymphedema, Chronic Obstructive Pulmonary Disease (COPD), Sleep Apnea, Congestive Heart Failure, Coronary Artery Disease, Hypertension, Peripheral Venous Disease, Type II Diabetes, Osteoarthritis, Neuropathy, Received  Chemotherapy] Date Acquired: [14:05/22/2018] [15:05/22/2018] [16:05/20/2018] Weeks of Treatment: [14:3] [15:3] [16:1] Wound Status: [14:Open] [15:Open] [16:Open] Measurements L x W x D [14:0.9x1.4x0.2] [15:5.4x32x0.1] [16:3x5x0.1] (cm) Area (cm) : [14:0.99] [15:135.717] [16:11.781] Volume (cm) : [14:0.198] [15:13.572] [16:1.178] % Reduction in Area: [14:30.70%] [15:-15904.40%] [16:16.70%] % Reduction in Volume: [14:-38.50%] [15:-15867.10%] [16:16.70%] Classification: [14:Full Thickness Without Exposed Support Structures] [15:Full  Thickness Without Exposed Support Structures] [16:Partial Thickness] Exudate Amount: [14:Small] [15:Large] [16:Large] Exudate Type: [14:Serous] [15:Serosanguineous] [16:Serosanguineous] Exudate Color: [14:amber] [15:red, brown] [16:red, brown] Wound Margin: [14:Flat and Intact] [15:Flat and Intact] [16:Flat and Intact] Granulation Amount: [14:None Present (0%)] [15:Large (67-100%)] [16:Large (67-100%)] Granulation Quality: [14:N/A] [15:Red, Hyper-granulation] [16:Red] Necrotic Amount: [14:None Present (0%)] [15:Small (1-33%)] [16:Small (1-33%)] Exposed Structures: [14:Fat Layer (Subcutaneous Tissue) Exposed: Yes Fascia: No Tendon: No Muscle: No] [15:Fat Layer (Subcutaneous Tissue) Exposed: Yes Fascia: No Tendon: No Muscle: No] [16:Fascia: No Fat Layer (Subcutaneous Tissue) Exposed: No Tendon: No Muscle: No] Joint: No Joint: No Joint: No Bone: No Bone: No Bone: No Epithelialization: None None N/A Periwound Skin Texture: Excoriation: Yes Excoriation: No Excoriation: No Callus: Yes Induration: No Induration: No Induration: No Callus: No Callus: No Crepitus: No Crepitus: No Crepitus: No Rash: No Rash: No Rash: No Scarring: No Scarring: No Scarring: No Periwound Skin Moisture: Maceration: No Maceration: No Maceration: No Dry/Scaly: No Dry/Scaly: No Dry/Scaly: No Periwound Skin Color: Atrophie Blanche: No Atrophie Blanche: No Atrophie Blanche: No Cyanosis: No Cyanosis: No Cyanosis: No Ecchymosis: No Ecchymosis: No Ecchymosis: No Erythema: No Erythema: No Erythema: No Hemosiderin Staining: No Hemosiderin Staining: No Hemosiderin Staining: No Mottled: No Mottled: No Mottled: No Pallor: No Pallor: No Pallor: No Rubor: No Rubor: No Rubor: No Temperature: No Abnormality N/A N/A Tenderness on Palpation: Yes Yes Yes Wound Preparation: Ulcer Cleansing: Ulcer Cleansing: Ulcer Cleansing: Rinsed/Irrigated with Saline Rinsed/Irrigated with Saline,  Rinsed/Irrigated with Saline, Other: soap and water Other: soap and water Topical Anesthetic Applied: Other: lidocaine 4% Topical Anesthetic Applied: Topical Anesthetic Applied: None Other: lidocaine 4% Wound Number: 17 N/A N/A Photos: No Photos N/A N/A Wound Location: Right Lower Leg - Lateral N/A N/A Wounding Event: Blister N/A N/A Primary Etiology: Venous Leg Ulcer N/A N/A Comorbid History: Anemia, Lymphedema, N/A N/A Chronic Obstructive Pulmonary Disease (COPD), Sleep Apnea, Congestive Heart Failure, Coronary Artery Disease, Hypertension, Peripheral Venous Disease, Type II Diabetes, Osteoarthritis, Neuropathy, Received Chemotherapy Date Acquired: 05/20/2018 N/A N/A Weeks of Treatment: 1 N/A N/A Wound Status: Open N/A N/A Measurements L x W x D 7.5x5x0.1 N/A N/A (cm) Area (cm) : 29.452 N/A N/A Volume (cm) : 2.945 N/A N/A % Reduction in Area: 53.10% N/A N/A % Reduction in Volume: 53.10% N/A N/A Classification: Partial Thickness N/A N/A Exudate Amount: Large N/A N/A Exudate Type: Serosanguineous N/A N/A Exudate Color: red, brown N/A N/A Wound Margin: Flat and Intact N/A N/A Granulation Amount: Large (67-100%) N/A N/A Adam Merritt, Adam Merritt ([id number]) Granulation Quality: Red N/A N/A Necrotic Amount: Small (1-33%) N/A N/A Exposed Structures: Fat Layer (Subcutaneous N/A N/A Tissue) Exposed: Yes Fascia: No Tendon: No Muscle: No Joint: No Bone: No Epithelialization: N/A N/A  N/A Periwound Skin Texture: Excoriation: No N/A N/A Induration: No Callus: No Crepitus: No Rash: No Scarring: No Periwound Skin Moisture: Maceration: No N/A N/A Dry/Scaly: No Periwound Skin Color: Atrophie Blanche: No N/A N/A Cyanosis: No Ecchymosis: No Erythema: No Hemosiderin Staining: No Mottled: No Pallor: No Rubor: No Temperature: N/A N/A N/A Tenderness on Palpation: Yes N/A N/A Wound Preparation: Ulcer Cleansing: N/A N/A Rinsed/Irrigated with Saline, Other: soap and  water Topical Anesthetic Applied: Other: lidocaine 4% Treatment Notes Electronic Signature(s) Signed: 06/18/2018 5:22:36 PM By: Montey Hora Entered By: Montey Hora on 06/18/2018 12:06:50 Adam Merritt ([id number]) -------------------------------------------------------------------------------- [place] Details Patient Name: Adam Merritt Date of Service: 06/18/2018 11:15 AM Medical Record Number: [id number] Patient Account Number: [id number] Date of Birth/Sex: 22-Feb-1946 (72 y.o. M) Treating RN: Montey Hora Primary Care Dawaun Brancato: [name] Other Clinician: Referring Myles Mallicoat: [name] Treating Shakeela Rabadan/Extender: Melburn Hake, [name]: 10 Active Inactive Abuse / Safety / Falls / Self Care Management Nursing Diagnoses: Potential for falls Goals: Patient will remain injury free related to falls Date Initiated: 04/09/2018 Target Resolution Date: 07/13/2018 Goal Status: Active Interventions: Assess fall risk on admission and as needed Notes: Orientation to the Wound Care Program Nursing Diagnoses: Knowledge deficit related to the wound healing center program Goals: Patient/caregiver will verbalize understanding of the [facility] Date Initiated: 04/09/2018 Target Resolution Date: 07/13/2018 Goal Status: Active Interventions: Provide education on orientation to the wound center Notes: Venous Leg Ulcer Nursing Diagnoses: Actual venous Insuffiency (use after diagnosis is confirmed) Goals: Patient will maintain optimal edema control Date Initiated: 04/09/2018 Target Resolution Date: 07/13/2018 Goal Status: Active Interventions: Assess peripheral edema status every visit. Adam Merritt, Adam Merritt ([id number]) Compression as ordered Provide education on venous insufficiency Notes: Electronic Signature(s) Signed: 06/18/2018 5:22:36 PM By: Montey Hora Entered By: Montey Hora on 06/18/2018 12:06:12 Adam Merritt  ([id number]) -------------------------------------------------------------------------------- Non-Wound Condition Assessment Details Patient Name: Adam Merritt Date of Service: 06/18/2018 11:15 AM Medical Record Number: [id number] Patient Account Number: [id number] Date of Birth/Sex: 04-22-1946 (72 y.o. M) Treating RN: Harold Barban Primary Care Klaus Casteneda: [name] Other Clinician: Referring Emmilee Reamer: [name] Treating Vladislav Axelson/Extender: STONE III, [name]: 10 Non-Wound Condition: Condition: Lymphedema Location: Leg Side: Bilateral Photos Periwound Skin Texture Texture Color No Abnormalities Noted: No No Abnormalities Noted: No Callus: No Atrophie Blanche: No Crepitus: No Cyanosis: No Excoriation: No Ecchymosis: No Friable: No Erythema: No Induration: Yes Hemosiderin Staining: Yes Rash: No Mottled: No Scarring: No Pallor: No Rubor: No Moisture No Abnormalities Noted: No Temperature / Pain Dry / Scaly: No Temperature: No Abnormality Maceration: No Electronic Signature(s) Signed: 06/20/2018 4:21:01 PM By: Harold Barban Entered By: Harold Barban on 06/18/2018 16:48:44 Adam Merritt ([id number]) -------------------------------------------------------------------------------- Pain Assessment Details Patient Name: Adam Merritt Date of Service: 06/18/2018 11:15 AM Medical Record Number: [id number] Patient Account Number: [id number] Date of Birth/Sex: 12-27-1945 (72 y.o. M) Treating RN: Montey Hora Primary Care Phebe Dettmer: [name] Other Clinician: Referring Jacqulyne Gladue: [name] Treating Sparkle Aube/Extender: Melburn Hake, [name]: 10 Active Problems Location of Pain Severity and Description of Pain Patient Has Paino Yes Site Locations Rate the pain. Current Pain Level: 4 Pain Management and Medication Current Pain Management: Electronic Signature(s) Signed: 06/18/2018 1:47:13 PM By: Lorine Bears RCP,  RRT, CHT Signed: 06/18/2018 5:22:36 PM By: Montey Hora Entered By: Lorine Bears on 06/18/2018 11:19:13 Adam Merritt ([id number]) -------------------------------------------------------------------------------- Patient/Caregiver Education Details Patient Name: Adam Merritt Date of Service: 06/18/2018 11:15 AM Medical  Record Number: [id number] Patient Account Number: [id number] Date of Birth/Gender: [birth year] (72 y.o. M) Treating RN: Montey Hora Primary Care Physician: [name] Other Clinician: Referring Physician: [name] Treating Physician/Extender: Sharalyn Ink in Treatment: 10 Education Assessment Education Provided To: Patient and Caregiver Education Topics Provided Venous: Handouts: Other: need for leg wrapping Methods: Demonstration, Explain/Verbal Responses: State content correctly Electronic Signature(s) Signed: 06/18/2018 5:22:36 PM By: Montey Hora Entered By: Montey Hora on 06/18/2018 12:12:29 Adam Merritt ([id number]) -------------------------------------------------------------------------------- Wound Assessment Details Patient Name: Adam Merritt Date of Service: 06/18/2018 11:15 AM Medical Record Number: [id number] Patient Account Number: [id number] Date of Birth/Sex: Oct 27, 1945 (72 y.o. M) Treating RN: Harold Barban Primary Care Viva Gallaher: [name] Other Clinician: Referring [name] [name]: [name] Treating Guelda Batson/Extender: STONE III, [name]: 10 Wound Status Wound Number: 14 Primary Trauma, Other Etiology: Wound Location: Right Toe Great Wound Open Wounding Event: Trauma Status: Date Acquired: 05/22/2018 Comorbid Anemia, Lymphedema, Chronic Obstructive Weeks Of Treatment: 3 History: Pulmonary Disease (COPD), Sleep Apnea, Clustered Wound: No Congestive Heart Failure, Coronary Artery Disease, Hypertension, Peripheral Venous Disease, Type II Diabetes, Osteoarthritis, Neuropathy,  Received Chemotherapy Photos Photo Uploaded By: Harold Barban on 06/18/2018 16:33:10 Wound Measurements Length: (cm) 0.9 Width: (cm) 1.4 Depth: (cm) 0.2 Area: (cm) 0.99 Volume: (cm) 0.198 % Reduction in Area: 30.7% % Reduction in Volume: -38.5% Epithelialization: None Tunneling: No Undermining: No Wound Description Full Thickness Without Exposed Support Foul Classification: Structures Slou Wound Margin: Flat and Intact Exudate Small Amount: Exudate Type: Serous Exudate Color: amber Odor After Cleansing: No gh/Fibrino No Wound Bed Granulation Amount: None Present (0%) Exposed Structure Necrotic Amount: None Present (0%) Fascia Exposed: No Fat Layer (Subcutaneous Tissue) Exposed: Yes Tendon Exposed: No Adam Merritt, Adam Merritt ([id number]) Muscle Exposed: No Joint Exposed: No Bone Exposed: No Periwound Skin Texture Texture Color No Abnormalities Noted: No No Abnormalities Noted: No Callus: Yes Atrophie Blanche: No Crepitus: No Cyanosis: No Excoriation: Yes Ecchymosis: No Induration: No Erythema: No Rash: No Hemosiderin Staining: No Scarring: No Mottled: No Pallor: No Moisture Rubor: No No Abnormalities Noted: No Dry / Scaly: No Temperature / Pain Maceration: No Temperature: No Abnormality Tenderness on Palpation: Yes Wound Preparation Ulcer Cleansing: Rinsed/Irrigated with Saline Topical Anesthetic Applied: Other: lidocaine 4%, Treatment Notes Wound #14 (Right Toe Great) Notes toe - prisma, foam and coverlet; others silvercel, xtrasorb and 3 layer wrap bilateral Electronic Signature(s) Signed: 06/20/2018 4:21:01 PM By: Harold Barban Entered By: Harold Barban on 06/18/2018 11:56:23 Adam Merritt ([id number]) -------------------------------------------------------------------------------- Wound Assessment Details Patient Name: Adam Merritt Date of Service: 06/18/2018 11:15 AM Medical Record Number: [id number] Patient Account Number:  [id number] Date of Birth/Sex: 08/24/1945 (73 y.o. M) Treating RN: Harold Barban Primary Care Arista Kettlewell: [name] Other Clinician: Referring Ngai Parcell: [name] Treating Mylena Sedberry/Extender: STONE III, [name]: 10 Wound Status Wound Number: 15 Primary Trauma, Other Etiology: Wound Location: Left Lower Leg Wound Open Wounding Event: Trauma Status: Date Acquired: 05/22/2018 Comorbid Anemia, Lymphedema, Chronic Obstructive Weeks Of Treatment: 3 History: Pulmonary Disease (COPD), Sleep Apnea, Clustered Wound: No Congestive Heart Failure, Coronary Artery Disease, Hypertension, Peripheral Venous Disease, Type II Diabetes, Osteoarthritis, Neuropathy, Received Chemotherapy Photos Photo Uploaded By: Harold Barban on 06/18/2018 16:34:14 Wound Measurements Length: (cm) 5.4 Width: (cm) 32 Depth: (cm) 0.1 Area: (cm) 135.717 Volume: (cm) 13.572 % Reduction in Area: -15904.4% % Reduction in Volume: -15867.1% Epithelialization: None Tunneling: No Undermining: No Wound Description Full Thickness Without Exposed Support Classification: Structures Wound Margin: Flat and Intact Exudate Large Amount: Exudate  Type: Serosanguineous Exudate Color: red, brown Foul Odor After Cleansing: No Slough/Fibrino No Wound Bed Granulation Amount: Large (67-100%) Exposed Structure Granulation Quality: Red, Hyper-granulation Fascia Exposed: No Necrotic Amount: Small (1-33%) Fat Layer (Subcutaneous Tissue) Exposed: Yes Necrotic Quality: Adherent Slough Tendon Exposed: No Adam Merritt, Adam Merritt ([id number]) Muscle Exposed: No Joint Exposed: No Bone Exposed: No Periwound Skin Texture Texture Color No Abnormalities Noted: No No Abnormalities Noted: No Callus: No Atrophie Blanche: No Crepitus: No Cyanosis: No Excoriation: No Ecchymosis: No Induration: No Erythema: No Rash: No Hemosiderin Staining: No Scarring: No Mottled: No Pallor: No Moisture Rubor: No No  Abnormalities Noted: No Dry / Scaly: No Temperature / Pain Maceration: No Tenderness on Palpation: Yes Wound Preparation Ulcer Cleansing: Rinsed/Irrigated with Saline, Other: soap and water, Topical Anesthetic Applied: None Treatment Notes Wound #15 (Left Lower Leg) Notes toe - prisma, foam and coverlet; others silvercel, xtrasorb and 3 layer wrap bilateral Electronic Signature(s) Signed: 06/20/2018 4:21:01 PM By: Harold Barban Entered By: Harold Barban on 06/18/2018 11:56:47 Adam Merritt ([id number]) -------------------------------------------------------------------------------- Wound Assessment Details Patient Name: Adam Merritt Date of Service: 06/18/2018 11:15 AM Medical Record Number: [id number] Patient Account Number: [id number] Date of Birth/Sex: August 08, 1945 (72 y.o. M) Treating RN: Harold Barban Primary Care Jordon Bourquin: [name] Other Clinician: Referring Davionne Mastrangelo: [name] Treating Felisia Balcom/Extender: STONE III, [name]: 10 Wound Status Wound Number: 16 Primary Venous Leg Ulcer Etiology: Wound Location: Lower Leg - Lateral Wound Open Wounding Event: Blister Status: Date Acquired: 05/20/2018 Comorbid Anemia, Lymphedema, Chronic Obstructive Weeks Of Treatment: 1 History: Pulmonary Disease (COPD), Sleep Apnea, Clustered Wound: No Congestive Heart Failure, Coronary Artery Disease, Hypertension, Peripheral Venous Disease, Type II Diabetes, Osteoarthritis, Neuropathy, Received Chemotherapy Photos Photo Uploaded By: Harold Barban on 06/18/2018 16:34:15 Wound Measurements Length: (cm) 3 Width: (cm) 5 Depth: (cm) 0.1 Area: (cm) 11.781 Volume: (cm) 1.178 % Reduction in Area: 16.7% % Reduction in Volume: 16.7% Tunneling: No Undermining: No Wound Description Classification: Partial Thickness Wound Margin: Flat and Intact Exudate Amount: Large Exudate Type: Serosanguineous Exudate Color: red, brown Foul Odor After Cleansing:  No Slough/Fibrino Yes Wound Bed Granulation Amount: Large (67-100%) Exposed Structure Granulation Quality: Red Fascia Exposed: No Necrotic Amount: Small (1-33%) Fat Layer (Subcutaneous Tissue) Exposed: No Necrotic Quality: Adherent Slough Tendon Exposed: No Muscle Exposed: No Joint Exposed: No Adam Merritt, Adam Merritt ([id number]) Bone Exposed: No Periwound Skin Texture Texture Color No Abnormalities Noted: No No Abnormalities Noted: No Callus: No Atrophie Blanche: No Crepitus: No Cyanosis: No Excoriation: No Ecchymosis: No Induration: No Erythema: No Rash: No Hemosiderin Staining: No Scarring: No Mottled: No Pallor: No Moisture Rubor: No No Abnormalities Noted: No Dry / Scaly: No Temperature / Pain Maceration: No Tenderness on Palpation: Yes Wound Preparation Ulcer Cleansing: Rinsed/Irrigated with Saline, Other: soap and water, Topical Anesthetic Applied: Other: lidocaine 4%, Electronic Signature(s) Signed: 06/20/2018 4:21:01 PM By: Harold Barban Entered By: Harold Barban on 06/18/2018 11:57:18 Adam Merritt ([id number]) -------------------------------------------------------------------------------- Wound Assessment Details Patient Name: Adam Merritt Date of Service: 06/18/2018 11:15 AM Medical Record Number: [id number] Patient Account Number: [id number] Date of Birth/Sex: 04/05/1946 (72 y.o. M) Treating RN: Harold Barban Primary Care Sherlon Nied: [name] Other Clinician: Referring Aleeha Boline: [name] Treating Quinci Gavidia/Extender: STONE III, [name]: 10 Wound Status Wound Number: 17 Primary Venous Leg Ulcer Etiology: Wound Location: Right Lower Leg - Lateral Wound Open Wounding Event: Blister Status: Date Acquired: 05/20/2018 Comorbid Anemia, Lymphedema, Chronic Obstructive Weeks Of Treatment: 1 History: Pulmonary Disease (COPD), Sleep Apnea, Clustered Wound: No Congestive  Heart Failure, Coronary Artery Disease, Hypertension,  Peripheral Venous Disease, Type II Diabetes, Osteoarthritis, Neuropathy, Received Chemotherapy Photos Photo Uploaded By: Harold Barban on 06/18/2018 16:34:41 Wound Measurements Length: (cm) 7.5 Width: (cm) 5 Depth: (cm) 0.1 Area: (cm) 29.452 Volume: (cm) 2.945 % Reduction in Area: 53.1% % Reduction in Volume: 53.1% Tunneling: No Undermining: No Wound Description Classification: Partial Thickness Wound Margin: Flat and Intact Exudate Amount: Large Exudate Type: Serosanguineous Exudate Color: red, brown Foul Odor After Cleansing: No Slough/Fibrino Yes Wound Bed Granulation Amount: Large (67-100%) Exposed Structure Granulation Quality: Red Fascia Exposed: No Necrotic Amount: Small (1-33%) Fat Layer (Subcutaneous Tissue) Exposed: Yes Necrotic Quality: Adherent Slough Tendon Exposed: No Muscle Exposed: No Joint Exposed: No Adam Merritt, Adam Merritt ([id number]) Bone Exposed: No Periwound Skin Texture Texture Color No Abnormalities Noted: No No Abnormalities Noted: No Callus: No Atrophie Blanche: No Crepitus: No Cyanosis: No Excoriation: No Ecchymosis: No Induration: No Erythema: No Rash: No Hemosiderin Staining: No Scarring: No Mottled: No Pallor: No Moisture Rubor: No No Abnormalities Noted: No Dry / Scaly: No Temperature / Pain Maceration: No Tenderness on Palpation: Yes Wound Preparation Ulcer Cleansing: Rinsed/Irrigated with Saline, Other: soap and water, Topical Anesthetic Applied: Other: lidocaine 4%, Treatment Notes Wound #17 (Right, Lateral Lower Leg) Notes toe - prisma, foam and coverlet; others silvercel, xtrasorb and 3 layer wrap bilateral Electronic Signature(s) Signed: 06/20/2018 4:21:01 PM By: Harold Barban Entered By: Harold Barban on 06/18/2018 11:57:41 Adam Merritt ([id number]) -------------------------------------------------------------------------------- Vitals Details Patient Name: Adam Merritt Date of Service: 06/18/2018  11:15 AM Medical Record Number: [id number] Patient Account Number: [id number] Date of Birth/Sex: 1945-09-26 (73 y.o. M) Treating RN: Montey Hora Primary Care Anastacio Bua: [name] Other Clinician: Referring Otisha Spickler: [name] Treating Eris Breck/Extender: Melburn Hake, [name]: 10 Vital Signs Time Taken: 11:19 Temperature (F): 97.9 Height (in): 66 Pulse (bpm): 69 Weight (lbs): 323 Respiratory Rate (breaths/min): 18 Body Mass Index (BMI): 52.1 Blood Pressure (mmHg): 153/68 Reference Range: 80 - 120 mg / dl Electronic Signature(s) Signed: 06/18/2018 1:47:13 PM By: Lorine Bears RCP, RRT, CHT Entered By: Lorine Bears on 06/18/2018 11:23:43

## 2018-06-21 NOTE — Progress Notes (Signed)
Advanced Care Plan.  Purpose of Encounter: CODE STATUS. Parties in Attendance: The patient, his wife and me. Patient's Decisional Capacity: Yes. Medical Story: Adam Merritt  is a 73 y.o. male with a known history of hypertension, diabetes, CAD, chronic diastolic CHF, hyperlipidemia, diabetic neuropathy, COPD, stroke and anemia etc. the patient is being admitted for sepsis due to leg infection and right great toe infection.  I discussed with the patient about his current condition, prognosis and CODE STATUS.  The patient want to be resuscitated and intubated if he has cardiopulmonary arrest. Plan:  Code Status: Full code. Time spent discussing advance care planning: 17 minutes.

## 2018-06-21 NOTE — Progress Notes (Signed)
CODE SEPSIS - PHARMACY COMMUNICATION  **Broad Spectrum Antibiotics should be administered within 1 hour of Sepsis diagnosis**  Time Code Sepsis Called/Page Received: 18:17  Antibiotics Ordered: ceftriaxone/vancomycin  Time of 1st antibiotic administration: 19:18  Additional action taken by pharmacy: 18:45 spoke with RN Cassie - we have until 19:17 to get antibiotics started.  19:11 spoke with Annie Main RN - we have until 19:17 to get antibiotics started.   If necessary, Name of Provider/Nurse Contacted: Palmer ,PharmD Clinical Pharmacist  06/21/2018  6:21 PM

## 2018-06-21 NOTE — Progress Notes (Signed)
Pharmacy Antibiotic Note  Adam Merritt is a 73 y.o. male admitted on 06/21/2018 with wound infection.  Pharmacy has been consulted for Zosyn and vancomycin dosing.  Plan: 1. Zosyn 4.5 gm IV Q8H EI 2. Vancomycin 2 g IV x 1 in ED followed by vancomycin 1.75 g IV Q48H  Weight to calculate CrCl & Ke: IBW Weight to calculate Vd: TBW Vd Coefficient: 0.5 Expected AUC: 495 SCr used: 2.19 mg/dL   Height: 5\' 6"  (167.6 cm) Weight: 290 lb (131.5 kg) IBW/kg (Calculated) : 63.8  Temp (24hrs), Avg:98 F (36.7 C), Min:98 F (36.7 C), Max:98 F (36.7 C)  Recent Labs  Lab 06/21/18 1624  WBC 27.3*  CREATININE 2.19*  LATICACIDVEN 2.0*    Estimated Creatinine Clearance: 38.6 mL/min (A) (by C-G formula based on SCr of 2.19 mg/dL (H)).    No Known Allergies  Antimicrobials this admission: Ceftriaxone x 1 in ED  Dose adjustments this admission:   Microbiology results:  BCx:   UCx:    Sputum:    MRSA PCR:   Thank you for allowing pharmacy to be a part of this patient's care.  Laural Benes, PharmD, BCPS Clinical Pharmacist 06/21/2018 7:44 PM

## 2018-06-21 NOTE — H&P (Signed)
[facility] [place] NAME: Adam Merritt    MR#:  [id number]  DATE OF BIRTH:  [birth year]  DATE OF ADMISSION:  06/21/2018  PRIMARY CARE PHYSICIAN: [name], MD   REQUESTING/REFERRING PHYSICIAN: Rudene Re, MD  CHIEF COMPLAINT:   Chief Complaint  Patient presents with  . Weakness  . Leg Pain   Fever, chills, generalized weakness 3 days. HISTORY OF PRESENT ILLNESS:  Adam Merritt  is a 73 y.o. male with a known history of hypertension, diabetes, CAD, chronic diastolic CHF, hyperlipidemia, diabetic neuropathy, COPD, stroke and anemia etc.  The patient presents the ED with above chief complaints.  He has chronic bilateral leg edema which has been worsening with tenderness, erythema and swelling for the past few days.  He also complains of fever and chills.  He is found septic with left leg cellulitis.  ED physician started sepsis protocol with IV antibiotics.  PAST MEDICAL HISTORY:   Past Medical History:  Diagnosis Date  . Anemia   . CAD (coronary artery disease)   . Chest pain   . Coronary artery disease   . Diabetes mellitus without complication ([place])   . Diastolic CHF ([place])   . Esophageal reflux   . Herpes zoster without mention of complication   . Hyperlipidemia   . Hypertension   . Insomnia   . Lumbago   . Lymphedema   . Neuropathy in diabetes ([place])   . Obstructive chronic bronchitis without exacerbation ([place])   . Other malaise and fatigue   . Stroke ([place])   . Varicose veins     PAST SURGICAL HISTORY:   Past Surgical History:  Procedure Laterality Date  . CHOLECYSTECTOMY    . CORONARY ARTERY BYPASS GRAFT      SOCIAL HISTORY:   Social History   Tobacco Use  . Smoking status: Never Smoker  . Smokeless tobacco: Never Used  Substance Use Topics  . Alcohol use: No    Comment: quit alcohol 30 years ago    FAMILY HISTORY:   Family History  Problem Relation Age of Onset  . Diabetes Mother   .  Hyperlipidemia Mother   . Hypertension Mother   . Diabetes Father   . Hyperlipidemia Father   . Hypertension Father   . CAD Father     DRUG ALLERGIES:  No Known Allergies  REVIEW OF SYSTEMS:   Review of Systems  Constitutional: Positive for chills, fever and malaise/fatigue.  HENT: Negative for sore throat.   Eyes: Negative for blurred vision and double vision.  Respiratory: Negative for cough, hemoptysis, shortness of breath, wheezing and stridor.   Cardiovascular: Negative for chest pain, palpitations, orthopnea and leg swelling.  Gastrointestinal: Negative for abdominal pain, blood in stool, diarrhea, melena, nausea and vomiting.  Genitourinary: Negative for dysuria, flank pain and hematuria.  Musculoskeletal: Negative for back pain and joint pain.       Left leg pain, swelling and redness.  Right great toe ulcer due to injury.  Neurological: Negative for dizziness, sensory change, focal weakness, seizures, loss of consciousness, weakness and headaches.  Endo/Heme/Allergies: Negative for polydipsia.  Psychiatric/Behavioral: Negative for depression. The patient is not nervous/anxious.     MEDICATIONS AT HOME:   Prior to Admission medications   Medication Sig Start Date End Date Taking? Authorizing Provider  albuterol (PROVENTIL HFA;VENTOLIN HFA) 108 (90 Base) MCG/ACT inhaler INHALE 1 PUFF INTO THE LUNGS EVERY 6 HOURS AS NEEDED FOR WHEEZING OR SHORTNESS OF BREATH  Patient taking differently: Inhale 1 puff into the lungs every 6 (six) hours as needed for wheezing or shortness of breath.  03/11/18  Yes Allyne Gee, MD  aspirin 325 MG EC tablet Take 325 mg by mouth daily.   Yes [provider]  atorvastatin (LIPITOR) 40 MG tablet Take 40 mg by mouth at bedtime.    Yes [provider]  carvedilol (COREG) 25 MG tablet Take 25 mg by mouth 2 (two) times daily with a meal.   Yes [provider]  docusate sodium (COLACE) 50 MG capsule Take 50 mg by mouth 2  (two) times daily.   Yes [provider]  febuxostat (ULORIC) 40 MG tablet Take 1 tablet (40 mg total) by mouth daily. 02/12/18  Yes [name], MD  fentaNYL (DURAGESIC - DOSED MCG/HR) 50 MCG/HR Place 1 patch (50 mcg total) onto the skin every 3 (three) days. 05/27/18  Yes Boscia, Greer Ee, NP  furosemide (LASIX) 40 MG tablet Take 1 tablet (40 mg total) by mouth daily. 04/18/18  Yes Max Sane, MD  gabapentin (NEURONTIN) 300 MG capsule Take 1 capsule po in AM and 1 capsule po in pm. Take 2 capsules po at bedtime. 05/27/18  Yes Boscia, Heather E, NP  hydrALAZINE (APRESOLINE) 25 MG tablet TAKE 1 TABLET BY MOUTH THREE TIMES DAILY FOR BLOOD PRESSURE Patient taking differently: Take 25 mg by mouth 3 (three) times daily.  02/12/18  Yes [name], MD  indapamide (LOZOL) 2.5 MG tablet Take 1 tablet (2.5 mg total) by mouth daily. 11/27/17  Yes [name], MD  Insulin Glargine (BASAGLAR KWIKPEN) 100 UNIT/ML SOPN INJECT 30 TO 36 UNITS UNDER THE SKIN EVERY DAY Patient taking differently: Inject 30-36 Units into the skin daily.  03/26/18  Yes Scarboro, Audie Clear, NP  ipratropium-albuterol (DUONEB) 0.5-2.5 (3) MG/3ML SOLN Take 3 mLs by nebulization every 4 (four) hours as needed (for shortness of breath). 09/10/17  Yes [name], MD  liraglutide (VICTOZA) 18 MG/3ML SOPN Inject 0.3 mLs (1.8 mg total) into the skin daily. 05/23/18  Yes Boscia, Greer Ee, NP  metolazone (ZAROXOLYN) 2.5 MG tablet Take 1 tablet (2.5 mg total) by mouth daily. 02/12/18  Yes [name], MD  Multiple Vitamin (MULTIVITAMIN WITH MINERALS) TABS tablet Take 1 tablet by mouth daily.   Yes [provider]  omeprazole (PRILOSEC) 20 MG capsule Take 20 mg by mouth daily.   Yes [provider]  pentoxifylline (TRENTAL) 400 MG CR tablet TAKE 1 TABLET BY MOUTH THREE TIMES DAILY FOR CLAUDICATION Patient taking differently: Take 400 mg by mouth 3 (three) times daily.  03/11/18  Yes Boscia, Greer Ee, NP  potassium  chloride SA (K-DUR,KLOR-CON) 20 MEQ tablet Take 2 tablets (40 mEq total) by mouth 2 (two) times daily. 06/13/18  Yes Hackney, Otila Kluver A, FNP  sennosides-docusate sodium (SENOKOT-S) 8.6-50 MG tablet Take 1-2 tablets by mouth 2 (two) times daily.   Yes [provider]  zolpidem (AMBIEN CR) 12.5 MG CR tablet Take 1 tablet (12.5 mg total) by mouth at bedtime as needed for sleep. 03/08/18  Yes Scarboro, Audie Clear, NP  ibuprofen (ADVIL,MOTRIN) 600 MG tablet Take 600 mg by mouth 2 (two) times daily as needed for mild pain or moderate pain.     [provider]      VITAL SIGNS:  Blood pressure 107/65, pulse 62, temperature 98 F (36.7 C), temperature source Oral, resp. rate 16, height 5\' 6"  (1.676 m), weight 131.5  kg, SpO2 98 %.  PHYSICAL EXAMINATION:  Physical Exam  GENERAL:  73 y.o.-year-old patient lying in the bed with no acute distress.  Morbid obesity. EYES: Pupils equal, round, reactive to light and accommodation. No scleral icterus. Extraocular muscles intact.  HEENT: Head atraumatic, normocephalic. Oropharynx and nasopharynx clear.  NECK:  Supple, no jugular venous distention. No thyroid enlargement, no tenderness.  LUNGS: Normal breath sounds bilaterally, no wheezing, rales,rhonchi or crepitation. No use of accessory muscles of respiration.  CARDIOVASCULAR: S1, S2 normal. No murmurs, rubs, or gallops.  ABDOMEN: Soft, nontender, nondistended. Bowel sounds present. No organomegaly or mass.  EXTREMITIES: No cyanosis, or clubbing.  Right great toe ulcer with possible infection.  Bilateral leg swelling.  Left leg edema, erythema and tenderness. NEUROLOGIC: Cranial nerves II through XII are intact. Muscle strength 5/5 in all extremities. Sensation intact. Gait not checked.  PSYCHIATRIC: The patient is alert and oriented x 3.  SKIN: No obvious rash, lesion, or ulcer.   LABORATORY PANEL:   CBC Recent Labs  Lab 06/21/18 1624  WBC 27.3*  HGB 13.6  HCT 39.9  PLT 202    ------------------------------------------------------------------------------------------------------------------  Chemistries  Recent Labs  Lab 06/21/18 1624  NA 134*  K 3.4*  CL 95*  CO2 28  GLUCOSE 176*  BUN 29*  CREATININE 2.19*  CALCIUM 8.6*  AST 33  ALT 20  ALKPHOS 71  BILITOT 1.3*   ------------------------------------------------------------------------------------------------------------------  Cardiac Enzymes No results for input(s): TROPONINI in the last 168 hours. ------------------------------------------------------------------------------------------------------------------  RADIOLOGY:  Dg Chest 2 View  Result Date: 06/21/2018 CLINICAL DATA:  Weakness unable walk since last night EXAM: CHEST - 2 VIEW COMPARISON:  April 16, 2018 FINDINGS: The heart size and mediastinal contours are stable. There is no focal infiltrate, pulmonary edema, or pleural effusion. The visualized skeletal structures are unremarkable. IMPRESSION: No active cardiopulmonary disease. Electronically Signed   By: Abelardo Diesel M.D.   On: 06/21/2018 18:07      IMPRESSION AND PLAN:   Sepsis due to right great toe infection and leg cellulitis. The patient will be admitted to medical floor. Continue vancomycin and Zosyn, follow-up CBC and cultures. Podiatry consult and wound care.  Lactic acidosis.  Continue treatment above, IV fluid support and follow-up level.  ARF on CKD.  Hold diuretics, gentle IV fluid support and follow-up BMP.  Hypertension.  Hold hypertension medication due to low side blood pressure.  Hypokalemia.  Potassium supplement.  Chronic diastolic CHF.  Stable.  Hold the diuretics due to low side blood pressure.  Morbid obesity.  Diet control and follow-up PCP.  All the records are reviewed and case discussed with ED provider. Management plans discussed with the patient, his wife and they are in agreement.  CODE STATUS: Full code.  TOTAL TIME TAKING CARE OF  THIS PATIENT: 36 minutes.    Demetrios Loll M.D on 06/21/2018 at 7:36 PM  Between 7am to 6pm - Pager - [phone]  After 6pm go to [email or web address] - password [email or web address] [facility]  [facility]  Office  [phone]  CC: Primary care physician; [name], MD   Note: This dictation was prepared with Dragon dictation along with smaller phrase technology. Any transcriptional errors that result from this process are unin

## 2018-06-22 LAB — CBC
HCT: 37.5 % — ABNORMAL LOW (ref 39.0–52.0)
Hemoglobin: 12.6 g/dL — ABNORMAL LOW (ref 13.0–17.0)
MCH: 32.3 pg (ref 26.0–34.0)
MCHC: 33.6 g/dL (ref 30.0–36.0)
MCV: 96.2 fL (ref 80.0–100.0)
Platelets: 174 10*3/uL (ref 150–400)
RBC: 3.9 MIL/uL — AB (ref 4.22–5.81)
RDW: 12.7 % (ref 11.5–15.5)
WBC: 21.3 10*3/uL — ABNORMAL HIGH (ref 4.0–10.5)
nRBC: 0 % (ref 0.0–0.2)

## 2018-06-22 LAB — BLOOD CULTURE ID PANEL (REFLEXED)
ACINETOBACTER BAUMANNII: NOT DETECTED
CANDIDA ALBICANS: NOT DETECTED
CANDIDA KRUSEI: NOT DETECTED
Candida glabrata: NOT DETECTED
Candida parapsilosis: NOT DETECTED
Candida tropicalis: NOT DETECTED
ENTEROBACTERIACEAE SPECIES: NOT DETECTED
Enterobacter cloacae complex: NOT DETECTED
Enterococcus species: NOT DETECTED
Escherichia coli: NOT DETECTED
Haemophilus influenzae: NOT DETECTED
Klebsiella oxytoca: NOT DETECTED
Klebsiella pneumoniae: NOT DETECTED
Listeria monocytogenes: NOT DETECTED
Neisseria meningitidis: NOT DETECTED
Proteus species: NOT DETECTED
Pseudomonas aeruginosa: NOT DETECTED
Serratia marcescens: NOT DETECTED
Staphylococcus aureus (BCID): NOT DETECTED
Staphylococcus species: NOT DETECTED
Streptococcus agalactiae: DETECTED — AB
Streptococcus pneumoniae: NOT DETECTED
Streptococcus pyogenes: NOT DETECTED
Streptococcus species: DETECTED — AB

## 2018-06-22 LAB — BASIC METABOLIC PANEL
Anion gap: 11 (ref 5–15)
BUN: 28 mg/dL — ABNORMAL HIGH (ref 8–23)
CHLORIDE: 98 mmol/L (ref 98–111)
CO2: 26 mmol/L (ref 22–32)
Calcium: 8 mg/dL — ABNORMAL LOW (ref 8.9–10.3)
Creatinine, Ser: 2.22 mg/dL — ABNORMAL HIGH (ref 0.61–1.24)
GFR calc Af Amer: 33 mL/min — ABNORMAL LOW (ref >60)
GFR calc non Af Amer: 28 mL/min — ABNORMAL LOW (ref >60)
Glucose, Bld: 169 mg/dL — ABNORMAL HIGH (ref 70–99)
Potassium: 2.5 mmol/L — CL (ref 3.5–5.1)
Sodium: 135 mmol/L (ref 135–145)

## 2018-06-22 LAB — HEMOGLOBIN A1C
Hgb A1c MFr Bld: 7 % — ABNORMAL HIGH (ref 4.8–5.6)
Mean Plasma Glucose: 154.2 mg/dL

## 2018-06-22 LAB — GLUCOSE, CAPILLARY
GLUCOSE-CAPILLARY: 122 mg/dL — AB (ref 70–99)
GLUCOSE-CAPILLARY: 121 mg/dL — AB (ref 70–99)
Glucose-Capillary: 152 mg/dL — ABNORMAL HIGH (ref 70–99)
Glucose-Capillary: 162 mg/dL — ABNORMAL HIGH (ref 70–99)

## 2018-06-22 LAB — MAGNESIUM: MAGNESIUM: 1.9 mg/dL (ref 1.7–2.4)

## 2018-06-22 MED ORDER — SODIUM CHLORIDE 0.9 % IV SOLN
2.0000 g | INTRAVENOUS | Status: DC
  Administered 2018-06-22 – 2018-06-25 (×4): 2 g via INTRAVENOUS
  Filled 2018-06-22 (×3): qty 2
  Filled 2018-06-22: qty 20
  Filled 2018-06-22: qty 2

## 2018-06-22 MED ORDER — POTASSIUM CHLORIDE CRYS ER 20 MEQ PO TBCR
60.0000 meq | EXTENDED_RELEASE_TABLET | Freq: Once | ORAL | Status: AC
  Administered 2018-06-22: 60 meq via ORAL
  Filled 2018-06-22: qty 3

## 2018-06-22 MED ORDER — POTASSIUM CHLORIDE CRYS ER 20 MEQ PO TBCR
20.0000 meq | EXTENDED_RELEASE_TABLET | Freq: Two times a day (BID) | ORAL | Status: AC
  Administered 2018-06-22 – 2018-06-24 (×4): 20 meq via ORAL
  Filled 2018-06-22 (×4): qty 1

## 2018-06-22 MED ORDER — POTASSIUM CHLORIDE CRYS ER 20 MEQ PO TBCR: 60.0000 meq | EXTENDED_RELEASE_TABLET | Freq: Once | ORAL | Status: AC

## 2018-06-22 MED ORDER — SODIUM CHLORIDE 0.9 % IV SOLN
INTRAVENOUS | Status: DC | PRN
  Administered 2018-06-22: 500 mL via INTRAVENOUS

## 2018-06-22 NOTE — Consult Note (Signed)
Reason for Consult: Ulceration right great toe. Referring Physician: Kyen Taite is an 73 y.o. male.  HPI: This is a 73 year old male with diabetes and neuropathy with a one-month history of a sore on his right great toe.  Recently was admitted with sepsis.  States he has been receiving wound care at the wound care clinic and home health care.  Past Medical History:  Diagnosis Date  . Anemia   . CAD (coronary artery disease)   . Chest pain   . Coronary artery disease   . Diabetes mellitus without complication ([place])   . Diastolic CHF ([place])   . Esophageal reflux   . Herpes zoster without mention of complication   . Hyperlipidemia   . Hypertension   . Insomnia   . Lumbago   . Lymphedema   . Neuropathy in diabetes ([place])   . Obstructive chronic bronchitis without exacerbation ([place])   . Other malaise and fatigue   . Stroke ([place])   . Varicose veins     Past Surgical History:  Procedure Laterality Date  . CHOLECYSTECTOMY    . CORONARY ARTERY BYPASS GRAFT      Family History  Problem Relation Age of Onset  . Diabetes Mother   . Hyperlipidemia Mother   . Hypertension Mother   . Diabetes Father   . Hyperlipidemia Father   . Hypertension Father   . CAD Father     Social History:  reports that he has never smoked. He has never used smokeless tobacco. He reports that he does not drink alcohol or use drugs.  Allergies: No Known Allergies  Medications:  Scheduled: . aspirin  325 mg Oral Daily  . atorvastatin  40 mg Oral QHS  . docusate  50 mg Oral BID  . febuxostat  40 mg Oral Daily  . fentaNYL  1 patch Transdermal Q72H  . gabapentin  300 mg Oral BID  . gabapentin  600 mg Oral QHS  . heparin  5,000 Units Subcutaneous Q8H  . indapamide  2.5 mg Oral Daily  . insulin aspart  0-5 Units Subcutaneous QHS  . insulin aspart  0-9 Units Subcutaneous TID WC  . insulin glargine  30 Units Subcutaneous Daily  . pantoprazole  40 mg Oral Daily  . pentoxifylline  400 mg Oral  TID  . potassium chloride SA  20 mEq Oral BID  . potassium chloride  60 mEq Oral Once  . senna-docusate  1-2 tablet Oral BID    Results for orders placed or performed during the hospital encounter of 06/21/18 (from the past 48 hour(s))  CBC with Differential/Platelet     Status: Abnormal   Collection Time: 06/21/18  4:24 PM  Result Value Ref Range   WBC 27.3 (H) 4.0 - 10.5 K/uL   RBC 4.21 (L) 4.22 - 5.81 MIL/uL   Hemoglobin 13.6 13.0 - 17.0 g/dL   HCT 39.9 39.0 - 52.0 %   MCV 94.8 80.0 - 100.0 fL   MCH 32.3 26.0 - 34.0 pg   MCHC 34.1 30.0 - 36.0 g/dL   RDW 12.5 11.5 - 15.5 %   Platelets 202 150 - 400 K/uL   nRBC 0.0 0.0 - 0.2 %   Neutrophils Relative % 94 %   Neutro Abs 25.5 (H) 1.7 - 7.7 K/uL   Lymphocytes Relative 2 %   Lymphs Abs 0.6 (L) 0.7 - 4.0 K/uL   Monocytes Relative 3 %   Monocytes Absolute 0.9 0.1 - 1.0 K/uL  Eosinophils Relative 0 %   Eosinophils Absolute 0.0 0.0 - 0.5 K/uL   Basophils Relative 0 %   Basophils Absolute 0.1 0.0 - 0.1 K/uL   WBC Morphology MORPHOLOGY UNREMARKABLE    RBC Morphology MORPHOLOGY UNREMARKABLE    Smear Review Normal platelet morphology    Immature Granulocytes 1 %   Abs Immature Granulocytes 0.20 (H) 0.00 - 0.07 K/uL    Comment: Performed at [facility], [address] [place], [place] [address]  Comprehensive metabolic panel     Status: Abnormal   Collection Time: 06/21/18  4:24 PM  Result Value Ref Range   Sodium 134 (L) 135 - 145 mmol/L   Potassium 3.4 (L) 3.5 - 5.1 mmol/L    Comment: HEMOLYSIS AT THIS LEVEL MAY AFFECT RESULT   Chloride 95 (L) 98 - 111 mmol/L   CO2 28 22 - 32 mmol/L   Glucose, Bld 176 (H) 70 - 99 mg/dL   BUN 29 (H) 8 - 23 mg/dL   Creatinine, Ser 2.19 (H) 0.61 - 1.24 mg/dL   Calcium 8.6 (L) 8.9 - 10.3 mg/dL   Total Protein 7.4 6.5 - 8.1 g/dL   Albumin 3.7 3.5 - 5.0 g/dL   AST 33 15 - 41 U/L    Comment: HEMOLYSIS AT THIS LEVEL MAY AFFECT RESULT   ALT 20 0 - 44 U/L   Alkaline Phosphatase 71 38 -  126 U/L   Total Bilirubin 1.3 (H) 0.3 - 1.2 mg/dL    Comment: HEMOLYSIS AT THIS LEVEL MAY AFFECT RESULT   GFR calc non Af Amer 29 (L) >60 mL/min   GFR calc Af Amer 33 (L) >60 mL/min   Anion gap 11 5 - 15    Comment: Performed at [facility], [address] [place], [place] [address]  Lactic acid, plasma     Status: Abnormal   Collection Time: 06/21/18  4:24 PM  Result Value Ref Range   Lactic Acid, Venous 2.0 (HH) 0.5 - 1.9 mmol/L    Comment: CRITICAL RESULT CALLED TO, READ BACK BY AND VERIFIED WITH KASSIE VANHOUSEN AT 1752 06/21/2018.PMF Performed at [facility], [address] [place], [place] [address]   Urinalysis, Complete w Microscopic     Status: Abnormal   Collection Time: 06/21/18  4:24 PM  Result Value Ref Range   Color, Urine YELLOW (A) YELLOW   APPearance CLEAR (A) CLEAR   Specific Gravity, Urine 1.009 1.005 - 1.030   pH 5.0 5.0 - 8.0   Glucose, UA NEGATIVE NEGATIVE mg/dL   Hgb urine dipstick NEGATIVE NEGATIVE   Bilirubin Urine NEGATIVE NEGATIVE   Ketones, ur NEGATIVE NEGATIVE mg/dL   Protein, ur NEGATIVE NEGATIVE mg/dL   Nitrite NEGATIVE NEGATIVE   Leukocytes, UA NEGATIVE NEGATIVE   RBC / HPF 0-5 0 - 5 RBC/hpf   WBC, UA 6-10 0 - 5 WBC/hpf   Bacteria, UA RARE (A) NONE SEEN   Squamous Epithelial / LPF 0-5 0 - 5   Mucus PRESENT    Hyaline Casts, UA PRESENT     Comment: Performed at [facility], [address] [place],  [address]  Influenza panel by PCR (type A & B)     Status: None   Collection Time: 06/21/18  5:26 PM  Result Value Ref Range   Influenza A By PCR NEGATIVE NEGATIVE   Influenza B By PCR NEGATIVE NEGATIVE    Comment: (NOTE) The Xpert Xpress Flu assay is intended as an aid in the  diagnosis of  influenza and should not be used as a sole basis for treatment.  This  assay is FDA approved for nasopharyngeal swab specimens only. Nasal  washings and aspirates are unacceptable for Xpert Xpress Flu testing. Performed  at [facility], [address] [place], [place] [address]   Blood Culture (routine x 2)     Status: None (Preliminary result)   Collection Time: 06/21/18  7:14 PM  Result Value Ref Range   Specimen Description BLOOD LEFT ARM    Special Requests      BOTTLES DRAWN AEROBIC AND ANAEROBIC Blood Culture results may not be optimal due to an excessive volume of blood received in culture bottles   Culture  Setup Time      Organism ID to follow [place] AND ANAEROBIC BOTTLES CRITICAL RESULT CALLED TO, READ BACK BY AND VERIFIED WITH: DAVID BESANTI ON 06/22/2018 AT 0523 QSD Performed at [facility], [address] [place], [place] [address]    Culture GRAM POSITIVE COCCI    Report Status PENDING   Blood Culture (routine x 2)     Status: None (Preliminary result)   Collection Time: 06/21/18  7:14 PM  Result Value Ref Range   Specimen Description BLOOD RIGHT WRIST    Special Requests      BOTTLES DRAWN AEROBIC AND ANAEROBIC Blood Culture adequate volume   Culture  Setup Time      GRAM POSITIVE COCCI IN BOTH AEROBIC AND ANAEROBIC BOTTLES CRITICAL RESULT CALLED TO, READ BACK BY AND VERIFIED WITH: DAVID BESANTI AT 4008 06/22/2018. QSD/PMF Performed at [facility], [address] [place], [place] [address]    Culture GRAM POSITIVE COCCI    Report Status PENDING   Blood Culture ID Panel (Reflexed)     Status: Abnormal   Collection Time: 06/21/18  7:14 PM  Result Value Ref Range   Enterococcus species NOT DETECTED NOT DETECTED   Listeria monocytogenes NOT DETECTED NOT DETECTED   Staphylococcus species NOT DETECTED NOT DETECTED   Staphylococcus aureus (BCID) NOT DETECTED NOT DETECTED   Streptococcus species DETECTED (A) NOT DETECTED    Comment: CRITICAL RESULT CALLED TO, READ BACK BY AND VERIFIED WITH: DAVID BESANTI ON 06/22/2018 AT 0523 QSD    Streptococcus agalactiae DETECTED (A) NOT DETECTED    Comment: CRITICAL RESULT CALLED TO,  READ BACK BY AND VERIFIED WITH: DAVID BESANTI ON 06/22/2018 AT 0523 QSD    Streptococcus pneumoniae NOT DETECTED NOT DETECTED   Streptococcus pyogenes NOT DETECTED NOT DETECTED   Acinetobacter baumannii NOT DETECTED NOT DETECTED   Enterobacteriaceae species NOT DETECTED NOT DETECTED   Enterobacter cloacae complex NOT DETECTED NOT DETECTED   Escherichia coli NOT DETECTED NOT DETECTED   Klebsiella oxytoca NOT DETECTED NOT DETECTED   Klebsiella pneumoniae NOT DETECTED NOT DETECTED   Proteus species NOT DETECTED NOT DETECTED   Serratia marcescens NOT DETECTED NOT DETECTED   Haemophilus influenzae NOT DETECTED NOT DETECTED   Neisseria meningitidis NOT DETECTED NOT DETECTED   Pseudomonas aeruginosa NOT DETECTED NOT DETECTED   Candida albicans NOT DETECTED NOT DETECTED   Candida glabrata NOT DETECTED NOT DETECTED   Candida krusei NOT DETECTED NOT DETECTED   Candida parapsilosis NOT DETECTED NOT DETECTED   Candida tropicalis NOT DETECTED NOT DETECTED    Comment: Performed at [facility], [address] [place], [place] [address]  Creatinine, serum     Status: Abnormal   Collection Time: 06/21/18  9:35 PM  Result Value Ref Range   Creatinine, Ser 2.19 (H) 0.61 - 1.24 mg/dL   GFR calc non Af Amer 29 (L) >60 mL/min   GFR calc Af Amer 33 (L) >60 mL/min    Comment: Performed at [facility], [address] [place], [place] [address]  Hemoglobin A1c     Status: Abnormal   Collection Time: 06/21/18  9:35 PM  Result Value Ref Range   Hgb A1c MFr Bld 7.0 (H) 4.8 - 5.6 %    Comment: (NOTE) Pre diabetes:          5.7%-6.4% Diabetes:              >6.4% Glycemic control for   <7.0% adults with diabetes    Mean Plasma Glucose 154.2 mg/dL    Comment: Performed at [facility], [address] [place], [place] [address]  Procalcitonin     Status: None   Collection Time: 06/21/18  9:35 PM  Result Value Ref Range   Procalcitonin 1.17 ng/mL    Comment:         Interpretation: PCT > 0.5 ng/mL and <= 2 ng/mL: Systemic infection (sepsis) is possible, but other conditions are known to elevate PCT as well. (NOTE)       Sepsis PCT Algorithm           Lower Respiratory Tract                                      Infection PCT Algorithm    ----------------------------     ----------------------------         PCT < 0.25 ng/mL                PCT < 0.10 ng/mL         Strongly encourage             Strongly discourage   discontinuation of antibiotics    initiation of antibiotics    ----------------------------     -----------------------------       PCT 0.25 - 0.50 ng/mL            PCT 0.10 - 0.25 ng/mL               OR       >80% decrease in PCT            Discourage initiation of                                            antibiotics      Encourage discontinuation           of antibiotics    ----------------------------     -----------------------------         PCT >= 0.50 ng/mL              PCT 0.26 - 0.50 ng/mL                AND       <80% decrease in PCT             Encourage initiation of  antibiotics       Encourage continuation           of antibiotics    ----------------------------     -----------------------------        PCT >= 0.50 ng/mL                  PCT > 0.50 ng/mL               AND         increase in PCT                  Strongly encourage                                      initiation of antibiotics    Strongly encourage escalation           of antibiotics                                     -----------------------------                                           PCT <= 0.25 ng/mL                                                 OR                                        > 80% decrease in PCT                                     Discontinue / Do not initiate                                             antibiotics Performed at [facility], [address]  [place], [place] [address]   Protime-INR     Status: None   Collection Time: 06/21/18  9:35 PM  Result Value Ref Range   Prothrombin Time 14.5 11.4 - 15.2 seconds   INR 1.14     Comment: Performed at [facility], [address] [place], [place] [address]  APTT     Status: Abnormal   Collection Time: 06/21/18  9:35 PM  Result Value Ref Range   aPTT 44 (H) 24 - 36 seconds    Comment:        IF BASELINE aPTT IS ELEVATED, SUGGEST PATIENT RISK ASSESSMENT BE USED TO DETERMINE APPROPRIATE ANTICOAGULANT THERAPY. Performed at [facility], [address] [place], [place] [address]   Glucose, capillary     Status: Abnormal   Collection Time: 06/21/18  9:37 PM  Result Value Ref Range   Glucose-Capillary 191 (H) 70 - 99 mg/dL  Basic metabolic panel  Status: Abnormal   Collection Time: 06/22/18  5:53 AM  Result Value Ref Range   Sodium 135 135 - 145 mmol/L   Potassium 2.5 (LL) 3.5 - 5.1 mmol/L    Comment: CRITICAL RESULT CALLED TO, READ BACK BY AND VERIFIED WITH MEGAN OAKLEY AT 1937 06/22/2018 DAS    Chloride 98 98 - 111 mmol/L   CO2 26 22 - 32 mmol/L   Glucose, Bld 169 (H) 70 - 99 mg/dL   BUN 28 (H) 8 - 23 mg/dL   Creatinine, Ser 2.22 (H) 0.61 - 1.24 mg/dL   Calcium 8.0 (L) 8.9 - 10.3 mg/dL   GFR calc non Af Amer 28 (L) >60 mL/min   GFR calc Af Amer 33 (L) >60 mL/min   Anion gap 11 5 - 15    Comment: Performed at [facility], [address] [place], [place] [address]  CBC     Status: Abnormal   Collection Time: 06/22/18  5:53 AM  Result Value Ref Range   WBC 21.3 (H) 4.0 - 10.5 K/uL   RBC 3.90 (L) 4.22 - 5.81 MIL/uL   Hemoglobin 12.6 (L) 13.0 - 17.0 g/dL   HCT 37.5 (L) 39.0 - 52.0 %   MCV 96.2 80.0 - 100.0 fL   MCH 32.3 26.0 - 34.0 pg   MCHC 33.6 30.0 - 36.0 g/dL   RDW 12.7 11.5 - 15.5 %   Platelets 174 150 - 400 K/uL   nRBC 0.0 0.0 - 0.2 %    Comment: Performed at [facility], [address], [place], [place] [address]  Magnesium      Status: None   Collection Time: 06/22/18  5:53 AM  Result Value Ref Range   Magnesium 1.9 1.7 - 2.4 mg/dL    Comment: Performed at [facility], [address] [place],  [address]  Glucose, capillary     Status: Abnormal   Collection Time: 06/22/18  7:49 AM  Result Value Ref Range   Glucose-Capillary 152 (H) 70 - 99 mg/dL    Dg Chest 2 View  Result Date: 06/21/2018 CLINICAL DATA:  Weakness unable walk since last night EXAM: CHEST - 2 VIEW COMPARISON:  April 16, 2018 FINDINGS: The heart size and mediastinal contours are stable. There is no focal infiltrate, pulmonary edema, or pleural effusion. The visualized skeletal structures are unremarkable. IMPRESSION: No active cardiopulmonary disease. Electronically Signed   By: Abelardo Diesel M.D.   On: 06/21/2018 18:07   Dg Toe Great Right  Result Date: 06/21/2018 CLINICAL DATA:  Initial evaluation for acute infection. EXAM: RIGHT GREAT TOE COMPARISON:  None. FINDINGS: Soft tissue ulceration seen at the distal and plantar aspect of the right first toe. On oblique view, there is subtle cortical irregularity at the distal tuft of the right first distal phalanx, which could reflect acute osteomyelitis. No radiopaque foreign body. No dissecting soft tissue emphysema. No acute fracture or dislocation. Bones are mildly osteopenic. IMPRESSION: Soft tissue ulceration at the distal and plantar aspect of the right great toe, compatible with infection. Subtle osseous irregularity at the distal aspect of the tuft of the right first distal phalanx suspicious for possible mild and/or early osteomyelitis. Electronically Signed   By: Jeannine Boga M.D.   On: 06/21/2018 22:02    Review of Systems  Constitutional: Positive for chills, fever and malaise/fatigue.  HENT: Negative for congestion and sore throat.   Eyes: Negative for blurred vision and double vision.  Respiratory: Negative for cough and shortness  of breath.   Cardiovascular:  Positive for leg swelling. Negative for chest pain.  Gastrointestinal: Negative for nausea and vomiting.  Genitourinary: Negative for frequency and urgency.  Musculoskeletal:       Patient relates some pain in the legs.  Skin:       Patient relates chronic sores on his legs with a draining ulceration on his right great toe for the past month.  Neurological:       Patient does relate numbness due to diabetic neuropathy.  Endo/Heme/Allergies: Does not bruise/bleed easily.  Psychiatric/Behavioral: Negative for depression.   Blood pressure 115/69, pulse (!) 106, temperature 98.4 F (36.9 C), resp. rate 18, height 5\' 6"  (1.676 m), weight 134.3 kg, SpO2 95 %. Physical Exam  Cardiovascular:  DP and PT pulses are palpable but diminished.  Musculoskeletal:     Comments: Stiff range of motion of the pedal joints.  Muscle testing deferred.  Neurological:  Complete loss of protective threshold with a monofilament wire in the feet and toes as well as impaired proprioception.  Skin:  Severe bilateral edema with stasis dermatitis changes and ulceration on the left lower extremity.  Full-thickness ulceration is noted on the distal plantar aspect of the right hallux approximately 1.5 cm diameter with central necrotic tissue.  This does probe down to the level of bone at the distal phalanx.  Some purulence is expressed.  No significant advancing cellulitis.    Assessment/Plan: Assessment: 1.  Full-thickness ulceration right hallux with osteomyelitis. 2.  Diabetes with associated neuropathy.  Plan: Discussed with the patient that the infection in his right great toe is very likely the source of his sepsis.  Recommended at this point that we amputate the distal aspect of his right great toe.  Discussed what this would involve as far as the procedure and recovery.  Discussed possible risks and complications of the procedure including inability to heal due to his diabetes or infection which may require  further debridement or amputation.  Also discussed obtaining a vascular consult if it appeared to be healing well after the surgery.  Patient states he has not eaten in the last couple of days and we will keep him n.p.o.  Consent for amputation distal aspect right great toe.  Plan for surgery later today.  Durward Fortes 06/22/2018, 10:48 AM

## 2018-06-22 NOTE — Progress Notes (Signed)
[facility] NAME: Adam Merritt    MR#:  [id number]  DATE OF BIRTH:  Oct 17, 1945  SUBJECTIVE:   Patient presented to the hospital due to fever, leukocytosis and right great toe redness induration suspected to be cellulitis/osteomyelitis.  Currently afebrile and hemodynamically stable, no chest pain, shortness of breath or any other associated symptoms.  Seen by podiatry and plan for possible partial amputation of the right great toe.  REVIEW OF SYSTEMS:    Review of Systems  Constitutional: Negative for chills and fever.  HENT: Negative for congestion and tinnitus.   Eyes: Negative for blurred vision and double vision.  Respiratory: Negative for cough, shortness of breath and wheezing.   Cardiovascular: Negative for chest pain, orthopnea and PND.  Gastrointestinal: Negative for abdominal pain, diarrhea, nausea and vomiting.  Genitourinary: Negative for dysuria and hematuria.  Neurological: Positive for weakness (generalized. ). Negative for dizziness, sensory change and focal weakness.  All other systems reviewed and are negative.   Nutrition: NPO for surgery today Tolerating diet: await after surgery Tolerating PT: Await Eval.   DRUG ALLERGIES:  No Known Allergies  VITALS:  Blood pressure 115/69, pulse (!) 106, temperature 98.4 F (36.9 C), resp. rate 18, height _0  (1.676 m), weight 134.3 kg, SpO2 95 %.  PHYSICAL EXAMINATION:   Physical Exam  GENERAL:  73 y.o.-year-old obese patient lying in bed in no acute distress.  EYES: Pupils equal, round, reactive to light and accommodation. No scleral icterus. Extraocular muscles intact.  HEENT: Head atraumatic, normocephalic. Oropharynx and nasopharynx clear.  NECK:  Supple, no jugular venous distention. No thyroid enlargement, no tenderness.  LUNGS: Normal breath sounds bilaterally, no wheezing, rales, rhonchi. No use of accessory muscles of respiration.  CARDIOVASCULAR: S1,  S2 normal. No murmurs, rubs, or gallops.  ABDOMEN: Soft, nontender, nondistended. Bowel sounds present. No organomegaly or mass.  EXTREMITIES: No cyanosis, clubbing or edema b/l.   Right great toe ulceration as shown below, signs of stasis dermatitis on the left lower extremity as shown below.      NEUROLOGIC: Cranial nerves II through XII are intact. No focal Motor or sensory deficits b/l.   PSYCHIATRIC: The patient is alert and oriented x 3.  SKIN: No obvious rash, lesion, or ulcer.    LABORATORY PANEL:   CBC Recent Labs  Lab 06/22/18 0553  WBC 21.3*  HGB 12.6*  HCT 37.5*  PLT 174   ------------------------------------------------------------------------------------------------------------------  Chemistries  Recent Labs  Lab 06/21/18 1624  06/22/18 0553  NA 134*  --  135  K 3.4*  --  2.5*  CL 95*  --  98  CO2 28  --  26  GLUCOSE 176*  --  169*  BUN 29*  --  28*  CREATININE 2.19*   < > 2.22*  CALCIUM 8.6*  --  8.0*  MG  --   --  1.9  AST 33  --   --   ALT 20  --   --   ALKPHOS 71  --   --   BILITOT 1.3*  --   --    < > = values in this interval not displayed.   ------------------------------------------------------------------------------------------------------------------  Cardiac Enzymes No results for input(s): TROPONINI in the last 168 hours. ------------------------------------------------------------------------------------------------------------------  RADIOLOGY:  Dg Chest 2 View  Result Date: 06/21/2018 CLINICAL DATA:  Weakness unable walk since last night EXAM: CHEST - 2 VIEW COMPARISON:  April 16, 2018 FINDINGS: The heart  size and mediastinal contours are stable. There is no focal infiltrate, pulmonary edema, or pleural effusion. The visualized skeletal structures are unremarkable. IMPRESSION: No active cardiopulmonary disease. Electronically Signed   By: Abelardo Diesel M.D.   On: 06/21/2018 18:07   Dg Toe Great Right  Result Date:  06/21/2018 CLINICAL DATA:  Initial evaluation for acute infection. EXAM: RIGHT GREAT TOE COMPARISON:  None. FINDINGS: Soft tissue ulceration seen at the distal and plantar aspect of the right first toe. On oblique view, there is subtle cortical irregularity at the distal tuft of the right first distal phalanx, which could reflect acute osteomyelitis. No radiopaque foreign body. No dissecting soft tissue emphysema. No acute fracture or dislocation. Bones are mildly osteopenic. IMPRESSION: Soft tissue ulceration at the distal and plantar aspect of the right great toe, compatible with infection. Subtle osseous irregularity at the distal aspect of the tuft of the right first distal phalanx suspicious for possible mild and/or early osteomyelitis. Electronically Signed   By: Jeannine Boga M.D.   On: 06/21/2018 22:02     ASSESSMENT AND PLAN:   73 year old male with past medical history of diabetes, history of previous CVA, obesity, GERD, history of coronary disease, chronic diastolic CHF, hypertension, hyperlipidemia, peripheral vascular disease who presented to the hospital due to fever, leukocytosis and noted to have a right great toe lesion with x-ray findings suggestive of early him osteomyelitis.  1.  Sepsis-patient met criteria admission given his fever, leukocytosis, tachycardia and right great toe infection with findings suggestive of osteomyelitis. - Cont. IV Ceftriaxone and follow cultures.  -Follow cultures.  2.  Right great toe infection/osteomyelitis- seen by podiatry and plan for amputation today. -Continue IV antibiotics as mentioned above.  Will follow intraoperative cultures.  3.  Signs of chronic stasis dermatitis- we will get wound team consult to further manage this.  4.  Hypokalemia- will supplement potassium orally, magnesium level normal. -Check level in the morning again.  5.  CKD stage IV-creatinine close to baseline and will continue to monitor. -Renal dose meds, avoid  nephrotoxins.  6.  Leukocytosis-secondary to sepsis and right great toe infection.  Follow with IV antibiotic therapy.    7.  Diabetes type 2 with neuropathy-continue sliding scale insulin.  8.  Diabetic neuropathy-continue gabapentin.  9.  History of gout-no acute attack.  Continue Uloric.  10.  GERD-continue Protonix.   All the records are reviewed and case discussed with Care Management/Social Worker. Management plans discussed with the patient, family and they are in agreement.  CODE STATUS: Full code  DVT Prophylaxis: Hep. SQ  TOTAL TIME TAKING CARE OF THIS PATIENT: 30 minutes.   POSSIBLE D/C IN 2-3 DAYS, DEPENDING ON CLINICAL CONDITION.   Henreitta Leber M.D on 06/22/2018 at 12:49 PM  Between 7am to 6pm - Pager - [phone]  After 6pm go to [email or web address] - password [email or web address]  Office  [phone]  CC: Primary care physician; [name], MD

## 2018-06-22 NOTE — H&P (View-Only) (Signed)
Reason for Consult: Ulceration right great toe. Referring Physician: Lamichael Youkhana is an 73 y.o. male.  HPI: This is a 73 year old male with diabetes and neuropathy with a one-month history of a sore on his right great toe.  Recently was admitted with sepsis.  States he has been receiving wound care at the wound care clinic and home health care.  Past Medical History:  Diagnosis Date  . Anemia   . CAD (coronary artery disease)   . Chest pain   . Coronary artery disease   . Diabetes mellitus without complication ([place])   . Diastolic CHF ([place])   . Esophageal reflux   . Herpes zoster without mention of complication   . Hyperlipidemia   . Hypertension   . Insomnia   . Lumbago   . Lymphedema   . Neuropathy in diabetes ([place])   . Obstructive chronic bronchitis without exacerbation ([place])   . Other malaise and fatigue   . Stroke ([place])   . Varicose veins     Past Surgical History:  Procedure Laterality Date  . CHOLECYSTECTOMY    . CORONARY ARTERY BYPASS GRAFT      Family History  Problem Relation Age of Onset  . Diabetes Mother   . Hyperlipidemia Mother   . Hypertension Mother   . Diabetes Father   . Hyperlipidemia Father   . Hypertension Father   . CAD Father     Social History:  reports that he has never smoked. He has never used smokeless tobacco. He reports that he does not drink alcohol or use drugs.  Allergies: No Known Allergies  Medications:  Scheduled: . aspirin  325 mg Oral Daily  . atorvastatin  40 mg Oral QHS  . docusate  50 mg Oral BID  . febuxostat  40 mg Oral Daily  . fentaNYL  1 patch Transdermal Q72H  . gabapentin  300 mg Oral BID  . gabapentin  600 mg Oral QHS  . heparin  5,000 Units Subcutaneous Q8H  . indapamide  2.5 mg Oral Daily  . insulin aspart  0-5 Units Subcutaneous QHS  . insulin aspart  0-9 Units Subcutaneous TID WC  . insulin glargine  30 Units Subcutaneous Daily  . pantoprazole  40 mg Oral Daily  . pentoxifylline  400 mg Oral  TID  . potassium chloride SA  20 mEq Oral BID  . potassium chloride  60 mEq Oral Once  . senna-docusate  1-2 tablet Oral BID    Results for orders placed or performed during the hospital encounter of 06/21/18 (from the past 48 hour(s))  CBC with Differential/Platelet     Status: Abnormal   Collection Time: 06/21/18  4:24 PM  Result Value Ref Range   WBC 27.3 (H) 4.0 - 10.5 K/uL   RBC 4.21 (L) 4.22 - 5.81 MIL/uL   Hemoglobin 13.6 13.0 - 17.0 g/dL   HCT 39.9 39.0 - 52.0 %   MCV 94.8 80.0 - 100.0 fL   MCH 32.3 26.0 - 34.0 pg   MCHC 34.1 30.0 - 36.0 g/dL   RDW 12.5 11.5 - 15.5 %   Platelets 202 150 - 400 K/uL   nRBC 0.0 0.0 - 0.2 %   Neutrophils Relative % 94 %   Neutro Abs 25.5 (H) 1.7 - 7.7 K/uL   Lymphocytes Relative 2 %   Lymphs Abs 0.6 (L) 0.7 - 4.0 K/uL   Monocytes Relative 3 %   Monocytes Absolute 0.9 0.1 - 1.0 K/uL  Eosinophils Relative 0 %   Eosinophils Absolute 0.0 0.0 - 0.5 K/uL   Basophils Relative 0 %   Basophils Absolute 0.1 0.0 - 0.1 K/uL   WBC Morphology MORPHOLOGY UNREMARKABLE    RBC Morphology MORPHOLOGY UNREMARKABLE    Smear Review Normal platelet morphology    Immature Granulocytes 1 %   Abs Immature Granulocytes 0.20 (H) 0.00 - 0.07 K/uL    Comment: Performed at [facility], [address] [place], [place] [address]  Comprehensive metabolic panel     Status: Abnormal   Collection Time: 06/21/18  4:24 PM  Result Value Ref Range   Sodium 134 (L) 135 - 145 mmol/L   Potassium 3.4 (L) 3.5 - 5.1 mmol/L    Comment: HEMOLYSIS AT THIS LEVEL MAY AFFECT RESULT   Chloride 95 (L) 98 - 111 mmol/L   CO2 28 22 - 32 mmol/L   Glucose, Bld 176 (H) 70 - 99 mg/dL   BUN 29 (H) 8 - 23 mg/dL   Creatinine, Ser 2.19 (H) 0.61 - 1.24 mg/dL   Calcium 8.6 (L) 8.9 - 10.3 mg/dL   Total Protein 7.4 6.5 - 8.1 g/dL   Albumin 3.7 3.5 - 5.0 g/dL   AST 33 15 - 41 U/L    Comment: HEMOLYSIS AT THIS LEVEL MAY AFFECT RESULT   ALT 20 0 - 44 U/L   Alkaline Phosphatase 71 38 -  126 U/L   Total Bilirubin 1.3 (H) 0.3 - 1.2 mg/dL    Comment: HEMOLYSIS AT THIS LEVEL MAY AFFECT RESULT   GFR calc non Af Amer 29 (L) >60 mL/min   GFR calc Af Amer 33 (L) >60 mL/min   Anion gap 11 5 - 15    Comment: Performed at [facility], [address] [place], [place] [address]  Lactic acid, plasma     Status: Abnormal   Collection Time: 06/21/18  4:24 PM  Result Value Ref Range   Lactic Acid, Venous 2.0 (HH) 0.5 - 1.9 mmol/L    Comment: CRITICAL RESULT CALLED TO, READ BACK BY AND VERIFIED WITH KASSIE VANHOUSEN AT 1752 06/21/2018.PMF Performed at [facility], [address] [place], [place] [address]   Urinalysis, Complete w Microscopic     Status: Abnormal   Collection Time: 06/21/18  4:24 PM  Result Value Ref Range   Color, Urine YELLOW (A) YELLOW   APPearance CLEAR (A) CLEAR   Specific Gravity, Urine 1.009 1.005 - 1.030   pH 5.0 5.0 - 8.0   Glucose, UA NEGATIVE NEGATIVE mg/dL   Hgb urine dipstick NEGATIVE NEGATIVE   Bilirubin Urine NEGATIVE NEGATIVE   Ketones, ur NEGATIVE NEGATIVE mg/dL   Protein, ur NEGATIVE NEGATIVE mg/dL   Nitrite NEGATIVE NEGATIVE   Leukocytes, UA NEGATIVE NEGATIVE   RBC / HPF 0-5 0 - 5 RBC/hpf   WBC, UA 6-10 0 - 5 WBC/hpf   Bacteria, UA RARE (A) NONE SEEN   Squamous Epithelial / LPF 0-5 0 - 5   Mucus PRESENT    Hyaline Casts, UA PRESENT     Comment: Performed at [facility], [address], [place], [place] [address]  Influenza panel by PCR (type A & B)     Status: None   Collection Time: 06/21/18  5:26 PM  Result Value Ref Range   Influenza A By PCR NEGATIVE NEGATIVE   Influenza B By PCR NEGATIVE NEGATIVE    Comment: (NOTE) The Xpert Xpress Flu assay is intended as an aid in the  diagnosis of  influenza and should not be used as a sole basis for treatment.  This  assay is FDA approved for nasopharyngeal swab specimens only. Nasal  washings and aspirates are unacceptable for Xpert Xpress Flu testing. Performed  at [facility], [address] [place], [place] [address]   Blood Culture (routine x 2)     Status: None (Preliminary result)   Collection Time: 06/21/18  7:14 PM  Result Value Ref Range   Specimen Description BLOOD LEFT ARM    Special Requests      BOTTLES DRAWN AEROBIC AND ANAEROBIC Blood Culture results may not be optimal due to an excessive volume of blood received in culture bottles   Culture  Setup Time      Organism ID to follow [name] AND ANAEROBIC BOTTLES CRITICAL RESULT CALLED TO, READ BACK BY AND VERIFIED WITH: DAVID BESANTI ON 06/22/2018 AT 0523 QSD Performed at [facility], [address] [place], [place] [address]    Culture GRAM POSITIVE COCCI    Report Status PENDING   Blood Culture (routine x 2)     Status: None (Preliminary result)   Collection Time: 06/21/18  7:14 PM  Result Value Ref Range   Specimen Description BLOOD RIGHT WRIST    Special Requests      BOTTLES DRAWN AEROBIC AND ANAEROBIC Blood Culture adequate volume   Culture  Setup Time      GRAM POSITIVE COCCI IN BOTH AEROBIC AND ANAEROBIC BOTTLES CRITICAL RESULT CALLED TO, READ BACK BY AND VERIFIED WITH: DAVID BESANTI AT 1540 06/22/2018. QSD/PMF Performed at [facility], [address] [place], [place] [address]    Culture GRAM POSITIVE COCCI    Report Status PENDING   Blood Culture ID Panel (Reflexed)     Status: Abnormal   Collection Time: 06/21/18  7:14 PM  Result Value Ref Range   Enterococcus species NOT DETECTED NOT DETECTED   Listeria monocytogenes NOT DETECTED NOT DETECTED   Staphylococcus species NOT DETECTED NOT DETECTED   Staphylococcus aureus (BCID) NOT DETECTED NOT DETECTED   Streptococcus species DETECTED (A) NOT DETECTED    Comment: CRITICAL RESULT CALLED TO, READ BACK BY AND VERIFIED WITH: DAVID BESANTI ON 06/22/2018 AT 0523 QSD    Streptococcus agalactiae DETECTED (A) NOT DETECTED    Comment: CRITICAL RESULT CALLED TO,  READ BACK BY AND VERIFIED WITH: DAVID BESANTI ON 06/22/2018 AT 0523 QSD    Streptococcus pneumoniae NOT DETECTED NOT DETECTED   Streptococcus pyogenes NOT DETECTED NOT DETECTED   Acinetobacter baumannii NOT DETECTED NOT DETECTED   Enterobacteriaceae species NOT DETECTED NOT DETECTED   Enterobacter cloacae complex NOT DETECTED NOT DETECTED   Escherichia coli NOT DETECTED NOT DETECTED   Klebsiella oxytoca NOT DETECTED NOT DETECTED   Klebsiella pneumoniae NOT DETECTED NOT DETECTED   Proteus species NOT DETECTED NOT DETECTED   Serratia marcescens NOT DETECTED NOT DETECTED   Haemophilus influenzae NOT DETECTED NOT DETECTED   Neisseria meningitidis NOT DETECTED NOT DETECTED   Pseudomonas aeruginosa NOT DETECTED NOT DETECTED   Candida albicans NOT DETECTED NOT DETECTED   Candida glabrata NOT DETECTED NOT DETECTED   Candida krusei NOT DETECTED NOT DETECTED   Candida parapsilosis NOT DETECTED NOT DETECTED   Candida tropicalis NOT DETECTED NOT DETECTED    Comment: Performed at [facility], [address] [place], [place] [address]  Creatinine, serum     Status: Abnormal   Collection Time: 06/21/18  9:35 PM  Result Value Ref Range   Creatinine, Ser 2.19 (H) 0.61 - 1.24 mg/dL   GFR calc non Af Amer 29 (L) >60 mL/min   GFR calc Af Amer 33 (L) >60 mL/min    Comment: Performed at [facility], [address] [place], [place] [address]  Hemoglobin A1c     Status: Abnormal   Collection Time: 06/21/18  9:35 PM  Result Value Ref Range   Hgb A1c MFr Bld 7.0 (H) 4.8 - 5.6 %    Comment: (NOTE) Pre diabetes:          5.7%-6.4% Diabetes:              >6.4% Glycemic control for   <7.0% adults with diabetes    Mean Plasma Glucose 154.2 mg/dL    Comment: Performed at [facility], [address], [place], [place] [address]  Procalcitonin     Status: None   Collection Time: 06/21/18  9:35 PM  Result Value Ref Range   Procalcitonin 1.17 ng/mL    Comment:         Interpretation: PCT > 0.5 ng/mL and <= 2 ng/mL: Systemic infection (sepsis) is possible, but other conditions are known to elevate PCT as well. (NOTE)       Sepsis PCT Algorithm           Lower Respiratory Tract                                      Infection PCT Algorithm    ----------------------------     ----------------------------         PCT < 0.25 ng/mL                PCT < 0.10 ng/mL         Strongly encourage             Strongly discourage   discontinuation of antibiotics    initiation of antibiotics    ----------------------------     -----------------------------       PCT 0.25 - 0.50 ng/mL            PCT 0.10 - 0.25 ng/mL               OR       >80% decrease in PCT            Discourage initiation of                                            antibiotics      Encourage discontinuation           of antibiotics    ----------------------------     -----------------------------         PCT >= 0.50 ng/mL              PCT 0.26 - 0.50 ng/mL                AND       <80% decrease in PCT             Encourage initiation of  antibiotics       Encourage continuation           of antibiotics    ----------------------------     -----------------------------        PCT >= 0.50 ng/mL                  PCT > 0.50 ng/mL               AND         increase in PCT                  Strongly encourage                                      initiation of antibiotics    Strongly encourage escalation           of antibiotics                                     -----------------------------                                           PCT <= 0.25 ng/mL                                                 OR                                        > 80% decrease in PCT                                     Discontinue / Do not initiate                                             antibiotics Performed at [facility], [address]  [place], [place] [address]   Protime-INR     Status: None   Collection Time: 06/21/18  9:35 PM  Result Value Ref Range   Prothrombin Time 14.5 11.4 - 15.2 seconds   INR 1.14     Comment: Performed at [facility], [address] [place], [place] [address]  APTT     Status: Abnormal   Collection Time: 06/21/18  9:35 PM  Result Value Ref Range   aPTT 44 (H) 24 - 36 seconds    Comment:        IF BASELINE aPTT IS ELEVATED, SUGGEST PATIENT RISK ASSESSMENT BE USED TO DETERMINE APPROPRIATE ANTICOAGULANT THERAPY. Performed at [facility], [address] [place], [place] [address]   Glucose, capillary     Status: Abnormal   Collection Time: 06/21/18  9:37 PM  Result Value Ref Range   Glucose-Capillary 191 (H) 70 - 99 mg/dL  Basic metabolic panel  Status: Abnormal   Collection Time: 06/22/18  5:53 AM  Result Value Ref Range   Sodium 135 135 - 145 mmol/L   Potassium 2.5 (LL) 3.5 - 5.1 mmol/L    Comment: CRITICAL RESULT CALLED TO, READ BACK BY AND VERIFIED WITH MEGAN OAKLEY AT 2725 06/22/2018 DAS    Chloride 98 98 - 111 mmol/L   CO2 26 22 - 32 mmol/L   Glucose, Bld 169 (H) 70 - 99 mg/dL   BUN 28 (H) 8 - 23 mg/dL   Creatinine, Ser 2.22 (H) 0.61 - 1.24 mg/dL   Calcium 8.0 (L) 8.9 - 10.3 mg/dL   GFR calc non Af Amer 28 (L) >60 mL/min   GFR calc Af Amer 33 (L) >60 mL/min   Anion gap 11 5 - 15    Comment: Performed at [facility], [address] [place], [place] [address]  CBC     Status: Abnormal   Collection Time: 06/22/18  5:53 AM  Result Value Ref Range   WBC 21.3 (H) 4.0 - 10.5 K/uL   RBC 3.90 (L) 4.22 - 5.81 MIL/uL   Hemoglobin 12.6 (L) 13.0 - 17.0 g/dL   HCT 37.5 (L) 39.0 - 52.0 %   MCV 96.2 80.0 - 100.0 fL   MCH 32.3 26.0 - 34.0 pg   MCHC 33.6 30.0 - 36.0 g/dL   RDW 12.7 11.5 - 15.5 %   Platelets 174 150 - 400 K/uL   nRBC 0.0 0.0 - 0.2 %    Comment: Performed at [facility], [address], [place], [place] [address]  Magnesium      Status: None   Collection Time: 06/22/18  5:53 AM  Result Value Ref Range   Magnesium 1.9 1.7 - 2.4 mg/dL    Comment: Performed at [facility], [address] [place], [place] [address]  Glucose, capillary     Status: Abnormal   Collection Time: 06/22/18  7:49 AM  Result Value Ref Range   Glucose-Capillary 152 (H) 70 - 99 mg/dL    Dg Chest 2 View  Result Date: 06/21/2018 CLINICAL DATA:  Weakness unable walk since last night EXAM: CHEST - 2 VIEW COMPARISON:  April 16, 2018 FINDINGS: The heart size and mediastinal contours are stable. There is no focal infiltrate, pulmonary edema, or pleural effusion. The visualized skeletal structures are unremarkable. IMPRESSION: No active cardiopulmonary disease. Electronically Signed   By: Abelardo Diesel M.D.   On: 06/21/2018 18:07   Dg Toe Great Right  Result Date: 06/21/2018 CLINICAL DATA:  Initial evaluation for acute infection. EXAM: RIGHT GREAT TOE COMPARISON:  None. FINDINGS: Soft tissue ulceration seen at the distal and plantar aspect of the right first toe. On oblique view, there is subtle cortical irregularity at the distal tuft of the right first distal phalanx, which could reflect acute osteomyelitis. No radiopaque foreign body. No dissecting soft tissue emphysema. No acute fracture or dislocation. Bones are mildly osteopenic. IMPRESSION: Soft tissue ulceration at the distal and plantar aspect of the right great toe, compatible with infection. Subtle osseous irregularity at the distal aspect of the tuft of the right first distal phalanx suspicious for possible mild and/or early osteomyelitis. Electronically Signed   By: Jeannine Boga M.D.   On: 06/21/2018 22:02    Review of Systems  Constitutional: Positive for chills, fever and malaise/fatigue.  HENT: Negative for congestion and sore throat.   Eyes: Negative for blurred vision and double vision.  Respiratory: Negative for cough and shortness  of breath.   Cardiovascular:  Positive for leg swelling. Negative for chest pain.  Gastrointestinal: Negative for nausea and vomiting.  Genitourinary: Negative for frequency and urgency.  Musculoskeletal:       Patient relates some pain in the legs.  Skin:       Patient relates chronic sores on his legs with a draining ulceration on his right great toe for the past month.  Neurological:       Patient does relate numbness due to diabetic neuropathy.  Endo/Heme/Allergies: Does not bruise/bleed easily.  Psychiatric/Behavioral: Negative for depression.   Blood pressure 115/69, pulse (!) 106, temperature 98.4 F (36.9 C), resp. rate 18, height 5\' 6"  (1.676 m), weight 134.3 kg, SpO2 95 %. Physical Exam  Cardiovascular:  DP and PT pulses are palpable but diminished.  Musculoskeletal:     Comments: Stiff range of motion of the pedal joints.  Muscle testing deferred.  Neurological:  Complete loss of protective threshold with a monofilament wire in the feet and toes as well as impaired proprioception.  Skin:  Severe bilateral edema with stasis dermatitis changes and ulceration on the left lower extremity.  Full-thickness ulceration is noted on the distal plantar aspect of the right hallux approximately 1.5 cm diameter with central necrotic tissue.  This does probe down to the level of bone at the distal phalanx.  Some purulence is expressed.  No significant advancing cellulitis.    Assessment/Plan: Assessment: 1.  Full-thickness ulceration right hallux with osteomyelitis. 2.  Diabetes with associated neuropathy.  Plan: Discussed with the patient that the infection in his right great toe is very likely the source of his sepsis.  Recommended at this point that we amputate the distal aspect of his right great toe.  Discussed what this would involve as far as the procedure and recovery.  Discussed possible risks and complications of the procedure including inability to heal due to his diabetes or infection which may require  further debridement or amputation.  Also discussed obtaining a vascular consult if it appeared to be healing well after the surgery.  Patient states he has not eaten in the last couple of days and we will keep him n.p.o.  Consent for amputation distal aspect right great toe.  Plan for surgery later today.  Durward Fortes 06/22/2018, 10:48 AM

## 2018-06-22 NOTE — Progress Notes (Signed)
PHARMACY - PHYSICIAN COMMUNICATION CRITICAL VALUE ALERT - BLOOD CULTURE IDENTIFICATION (BCID)  Adam Merritt is an 73 y.o. male who presented to [facility] on 06/21/2018 with a chief complaint of weakness and leg pain  Assessment:  HR 100's, WBC 27.3, PCT 1.17, CT of R great toe: Soft tissue ulceration at the distal and plantar aspect of the right great toe, compatible with infection. Subtle osseous irregularity at the distal aspect of the tuft of the right first distal phalanx suspicious for possible mild and/or early osteomyelitis. 4/4 [facility] BCID strep agalactiae  Name of physician (or Provider) Contacted: Sedalia Muta  Current antibiotics: Vanc/zosyn  Changes to prescribed antibiotics recommended:  Recommendations accepted by provider -- Will switch to ceftriaxone 2g IV q24h for possible strep osteo  Results for orders placed or performed during the hospital encounter of 06/21/18  Blood Culture ID Panel (Reflexed) (Collected: 06/21/2018  7:14 PM)  Result Value Ref Range   Enterococcus species NOT DETECTED NOT DETECTED   Listeria monocytogenes NOT DETECTED NOT DETECTED   Staphylococcus species NOT DETECTED NOT DETECTED   Staphylococcus aureus (BCID) NOT DETECTED NOT DETECTED   Streptococcus species DETECTED (A) NOT DETECTED   Streptococcus agalactiae DETECTED (A) NOT DETECTED   Streptococcus pneumoniae NOT DETECTED NOT DETECTED   Streptococcus pyogenes NOT DETECTED NOT DETECTED   Acinetobacter baumannii NOT DETECTED NOT DETECTED   Enterobacteriaceae species NOT DETECTED NOT DETECTED   Enterobacter cloacae complex NOT DETECTED NOT DETECTED   Escherichia coli NOT DETECTED NOT DETECTED   Klebsiella oxytoca NOT DETECTED NOT DETECTED   Klebsiella pneumoniae NOT DETECTED NOT DETECTED   Proteus species NOT DETECTED NOT DETECTED   Serratia marcescens NOT DETECTED NOT DETECTED   Haemophilus influenzae NOT DETECTED NOT DETECTED   Neisseria meningitidis NOT DETECTED NOT DETECTED   Pseudomonas aeruginosa NOT DETECTED NOT DETECTED   Candida albicans NOT DETECTED NOT DETECTED   Candida glabrata NOT DETECTED NOT DETECTED   Candida krusei NOT DETECTED NOT DETECTED   Candida parapsilosis NOT DETECTED NOT DETECTED   Candida tropicalis NOT DETECTED NOT DETECTED   Adam Merritt, PharmD, BCPS Clinical Pharmacist 06/22/2018

## 2018-06-23 ENCOUNTER — Inpatient Hospital Stay: Payer: Medicare Other | Admitting: Certified Registered"

## 2018-06-23 ENCOUNTER — Encounter
Admission: EM | Disposition: A | Discharge status: Home / Self Care | Payer: Self-pay | Source: Home / Self Care | Attending: Specialist

## 2018-06-23 HISTORY — PX: AMPUTATION TOE: SHX6595

## 2018-06-23 LAB — BASIC METABOLIC PANEL
Anion gap: 10 (ref 5–15)
BUN: 30 mg/dL — ABNORMAL HIGH (ref 8–23)
CO2: 26 mmol/L (ref 22–32)
Calcium: 8.2 mg/dL — ABNORMAL LOW (ref 8.9–10.3)
Chloride: 100 mmol/L (ref 98–111)
Creatinine, Ser: 1.94 mg/dL — ABNORMAL HIGH (ref 0.61–1.24)
GFR calc Af Amer: 39 mL/min — ABNORMAL LOW (ref >60)
GFR calc non Af Amer: 33 mL/min — ABNORMAL LOW (ref >60)
GLUCOSE: 126 mg/dL — AB (ref 70–99)
Potassium: 3.1 mmol/L — ABNORMAL LOW (ref 3.5–5.1)
Sodium: 136 mmol/L (ref 135–145)

## 2018-06-23 LAB — GLUCOSE, CAPILLARY
GLUCOSE-CAPILLARY: 141 mg/dL — AB (ref 70–99)
Glucose-Capillary: 109 mg/dL — ABNORMAL HIGH (ref 70–99)
Glucose-Capillary: 139 mg/dL — ABNORMAL HIGH (ref 70–99)
Glucose-Capillary: 139 mg/dL — ABNORMAL HIGH (ref 70–99)
Glucose-Capillary: 101 mg/dL — ABNORMAL HIGH (ref 70–99)

## 2018-06-23 LAB — CBC
HCT: 36.2 % — ABNORMAL LOW (ref 39.0–52.0)
Hemoglobin: 12.1 g/dL — ABNORMAL LOW (ref 13.0–17.0)
MCH: 32 pg (ref 26.0–34.0)
MCHC: 33.4 g/dL (ref 30.0–36.0)
MCV: 95.8 fL (ref 80.0–100.0)
Platelets: 154 10*3/uL (ref 150–400)
RBC: 3.78 MIL/uL — ABNORMAL LOW (ref 4.22–5.81)
RDW: 12.7 % (ref 11.5–15.5)
WBC: 12.9 10*3/uL — AB (ref 4.0–10.5)
nRBC: 0 % (ref 0.0–0.2)

## 2018-06-23 SURGERY — AMPUTATION, TOE
Anesthesia: Monitor Anesthesia Care | Laterality: Right

## 2018-06-23 MED ORDER — OCUVITE-LUTEIN PO CAPS
1.0000 | ORAL_CAPSULE | Freq: Every day | ORAL | Status: DC
  Administered 2018-06-24 – 2018-06-25 (×2): 1 via ORAL
  Filled 2018-06-23 (×3): qty 1

## 2018-06-23 MED ORDER — FENTANYL CITRATE (PF) 100 MCG/2ML IJ SOLN
INTRAMUSCULAR | Status: AC
  Filled 2018-06-23: qty 2

## 2018-06-23 MED ORDER — MIDAZOLAM HCL 2 MG/2ML IJ SOLN
INTRAMUSCULAR | Status: DC | PRN
  Administered 2018-06-23: 1 mg via INTRAVENOUS
  Administered 2018-06-23: 0.5 mg via INTRAVENOUS

## 2018-06-23 MED ORDER — FENTANYL CITRATE (PF) 100 MCG/2ML IJ SOLN: 25.0000 ug | INTRAMUSCULAR | Status: DC | PRN

## 2018-06-23 MED ORDER — BUPIVACAINE HCL (PF) 0.5 % IJ SOLN
INTRAMUSCULAR | Status: AC
  Filled 2018-06-23: qty 30

## 2018-06-23 MED ORDER — ENSURE MAX PROTEIN PO LIQD
11.0000 [oz_av] | Freq: Two times a day (BID) | ORAL | Status: DC
  Administered 2018-06-24 – 2018-06-25 (×2): 11 [oz_av] via ORAL
  Filled 2018-06-23: qty 330

## 2018-06-23 MED ORDER — PROPOFOL 500 MG/50ML IV EMUL
INTRAVENOUS | Status: AC
  Filled 2018-06-23: qty 50

## 2018-06-23 MED ORDER — BUPIVACAINE HCL 0.5 % IJ SOLN
INTRAMUSCULAR | Status: DC | PRN
  Administered 2018-06-23: 8 mL

## 2018-06-23 MED ORDER — FENTANYL CITRATE (PF) 100 MCG/2ML IJ SOLN
INTRAMUSCULAR | Status: DC | PRN
  Administered 2018-06-23: 50 ug via INTRAVENOUS

## 2018-06-23 MED ORDER — LIDOCAINE HCL (PF) 2 % IJ SOLN
INTRAMUSCULAR | Status: AC
  Filled 2018-06-23: qty 10

## 2018-06-23 MED ORDER — ONDANSETRON HCL 4 MG/2ML IJ SOLN: 4.0000 mg | Freq: Once | INTRAMUSCULAR | Status: DC | PRN

## 2018-06-23 MED ORDER — MIDAZOLAM HCL 2 MG/2ML IJ SOLN
INTRAMUSCULAR | Status: AC
  Filled 2018-06-23: qty 2

## 2018-06-23 SURGICAL SUPPLY — 47 items
BANDAGE ACE 4X5 VEL STRL LF (GAUZE/BANDAGES/DRESSINGS) ×2 IMPLANT
BLADE MED AGGRESSIVE (BLADE) ×2 IMPLANT
BLADE OSC/SAGITTAL MD 5.5X18 (BLADE) ×2 IMPLANT
BLADE SURG 15 STRL LF DISP TIS (BLADE) ×2 IMPLANT
BLADE SURG 15 STRL SS (BLADE) ×4
BLADE SURG MINI STRL (BLADE) ×2 IMPLANT
BNDG CONFORM 2 STRL LF (GAUZE/BANDAGES/DRESSINGS) ×2 IMPLANT
BNDG ESMARK 4X12 TAN STRL LF (GAUZE/BANDAGES/DRESSINGS) ×2 IMPLANT
BNDG GAUZE 4.5X4.1 6PLY STRL (MISCELLANEOUS) ×2 IMPLANT
CANISTER SUCT 1200ML W/VALVE (MISCELLANEOUS) ×2 IMPLANT
COVER WAND RF STERILE (DRAPES) ×2 IMPLANT
CUFF TOURN 18 STER (MISCELLANEOUS) ×2 IMPLANT
CUFF TOURN DUAL PL 12 NO SLV (MISCELLANEOUS) ×2 IMPLANT
DRAPE FLUOR MINI C-ARM 54X84 (DRAPES) ×2 IMPLANT
DURAPREP 26ML APPLICATOR (WOUND CARE) ×2 IMPLANT
ELECT REM PT RETURN 9FT ADLT (ELECTROSURGICAL) ×2
ELECTRODE REM PT RTRN 9FT ADLT (ELECTROSURGICAL) ×1 IMPLANT
GAUZE PETRO XEROFOAM 1X8 (MISCELLANEOUS) ×2 IMPLANT
GAUZE SPONGE 4X4 12PLY STRL (GAUZE/BANDAGES/DRESSINGS) ×2 IMPLANT
GLOVE BIO SURGEON STRL SZ7.5 (GLOVE) ×2 IMPLANT
GLOVE INDICATOR 8.0 STRL GRN (GLOVE) ×2 IMPLANT
GOWN STRL REUS W/ TWL LRG LVL3 (GOWN DISPOSABLE) ×2 IMPLANT
GOWN STRL REUS W/TWL LRG LVL3 (GOWN DISPOSABLE) ×4
HANDPIECE VERSAJET DEBRIDEMENT (MISCELLANEOUS) ×2 IMPLANT
KIT TURNOVER KIT A (KITS) ×2 IMPLANT
LABEL OR SOLS (LABEL) ×2 IMPLANT
NDL FILTER BLUNT 18X1 1/2 (NEEDLE) ×1 IMPLANT
NDL HYPO 25X1 1.5 SAFETY (NEEDLE) ×2 IMPLANT
NEEDLE FILTER BLUNT 18X 1/2SAF (NEEDLE) ×1
NEEDLE FILTER BLUNT 18X1 1/2 (NEEDLE) ×1 IMPLANT
NEEDLE HYPO 25X1 1.5 SAFETY (NEEDLE) ×4 IMPLANT
NS IRRIG 500ML POUR BTL (IV SOLUTION) ×2 IMPLANT
PACK EXTREMITY ARMC (MISCELLANEOUS) ×2 IMPLANT
SOL .9 NS 3000ML IRR  AL (IV SOLUTION) ×1
SOL .9 NS 3000ML IRR AL (IV SOLUTION) ×1
SOL .9 NS 3000ML IRR UROMATIC (IV SOLUTION) ×1 IMPLANT
SOL PREP PVP 2OZ (MISCELLANEOUS) ×2
SOLUTION PREP PVP 2OZ (MISCELLANEOUS) ×1 IMPLANT
STOCKINETTE STRL 6IN 960660 (GAUZE/BANDAGES/DRESSINGS) ×2 IMPLANT
STRIP CLOSURE SKIN 1/4X4 (GAUZE/BANDAGES/DRESSINGS) ×2 IMPLANT
SUT ETHILON 3-0 FS-10 30 BLK (SUTURE) ×2
SUT ETHILON 4-0 (SUTURE)
SUT ETHILON 4-0 FS2 18XMFL BLK (SUTURE)
SUTURE EHLN 3-0 FS-10 30 BLK (SUTURE) ×1 IMPLANT
SUTURE ETHLN 4-0 FS2 18XMF BLK (SUTURE) IMPLANT
SWAB DUAL CULTURE TRANS RED ST (MISCELLANEOUS) ×2 IMPLANT
SYR 10ML LL (SYRINGE) ×2 IMPLANT

## 2018-06-23 NOTE — Consult Note (Signed)
[place] Nurse wound consult note Reason for Consult: [place] consulted simultaneously with Podiatry and patient is scheduled for surgery today with Dr. Cleda Mccreedy.  There is no role for WOC Nursing; [place] Nurse did not see.  [place] nursing team will not follow, but will remain available to this patient, the nursing and medical teams.  Please re-consult if needed. Thanks, Maudie Flakes, MSN, RN, [place], Arther Abbott  Pager# [phone]

## 2018-06-23 NOTE — Interval H&P Note (Signed)
History and Physical Interval Note:  06/23/2018 11:23 AM  Adam Merritt  has presented today for surgery, with the diagnosis of osteomyelitis right great toe   The various methods of treatment have been discussed with the patient and family. After consideration of risks, benefits and other options for treatment, the patient has consented to  Procedure(s): AMPUTATION TOE (Right) as a surgical intervention .  The patient's history has been reviewed, patient examined, no change in status, stable for surgery.  I have reviewed the patient's chart and labs.  Questions were answered to the patient's satisfaction.     Durward Fortes

## 2018-06-23 NOTE — Anesthesia Post-op Follow-up Note (Signed)
Anesthesia QCDR form completed.        

## 2018-06-23 NOTE — Op Note (Signed)
Date of operation: 06/23/2018.  Surgeon: Durward Fortes D.P.M.  Preoperative diagnosis: Osteomyelitis right great toe.  Postoperative diagnosis: Same.  Procedure: Partial amputation right great toe.  Anesthesia: Local MAC.  Hemostasis: None.  Estimated blood loss: Less than 5 cc.  Cultures: Bone culture distal phalanx right hallux.  Pathology: Distal right great toe.  Complications: None apparent.  Operative indications.  This is a 73 year old diabetic male recently admitted for sepsis.  Chronic ulceration on the right great toe with radiographic osteomyelitis which appears to be the likely source and decision was made for distal amputation of the right great toe.  Operative procedure: Patient was taken to the operating room and placed on the table in the supine position.  Following satisfactory sedation the right hallux area was anesthetized with 8 cc of 0.5% bupivacaine plain around the right great toe base and distal metatarsal.  The foot was prepped and draped in the usual sterile fashion.  Attention was then directed to the distal aspect of the right hallux where a fishmouth type incision was made coursing from medial to lateral and carried sharply down to bone.  Dissection carried back proximally to the level of the inner phalangeal joint where the toe was disarticulated and removed in toto.  Good healthy granular bleeding tissues were noted.  The wound was flushed with copious amounts of sterile saline and then closed using 3-0 nylon vertical mattress and simple interrupted sutures.  Xeroform 4 x 4's con form Kerlix and an Ace wrap applied to the right lower extremity.  Patient was taken to the PACU with vital signs stable and in good condition.

## 2018-06-23 NOTE — Progress Notes (Signed)
[facility] [place] NAME: Adam Merritt    MR#:  [id number]  DATE OF BIRTH:  16-May-1946  SUBJECTIVE:   Patient presented to the hospital due to fever, leukocytosis and right great toe redness induration suspected to be cellulitis/osteomyelitis.  WBC count improved.  Afebrile. Plan for surgery today.  Wife is at bedside. No other complaints or events overnight.   REVIEW OF SYSTEMS:    Review of Systems  Constitutional: Negative for chills and fever.  HENT: Negative for congestion and tinnitus.   Eyes: Negative for blurred vision and double vision.  Respiratory: Negative for cough, shortness of breath and wheezing.   Cardiovascular: Negative for chest pain, orthopnea and PND.  Gastrointestinal: Negative for abdominal pain, diarrhea, nausea and vomiting.  Genitourinary: Negative for dysuria and hematuria.  Neurological: Positive for weakness (generalized. ). Negative for dizziness, sensory change and focal weakness.  All other systems reviewed and are negative.   Nutrition: NPO for surgery today Tolerating diet: await after surgery Tolerating PT: Await Eval.   DRUG ALLERGIES:  No Known Allergies  VITALS:  Blood pressure 133/69, pulse 94, temperature 98.5 F (36.9 C), temperature source Oral, resp. rate 19, height '5\' 6"'  (1.676 m), weight 134.3 kg, SpO2 94 %.  PHYSICAL EXAMINATION:   Physical Exam  GENERAL:  73 y.o.-year-old obese patient lying in bed in no acute distress.  EYES: Pupils equal, round, reactive to light and accommodation. No scleral icterus. Extraocular muscles intact.  HEENT: Head atraumatic, normocephalic. Oropharynx and nasopharynx clear.  NECK:  Supple, no jugular venous distention. No thyroid enlargement, no tenderness.  LUNGS: Normal breath sounds bilaterally, no wheezing, rales, rhonchi. No use of accessory muscles of respiration.  CARDIOVASCULAR: S1, S2 normal. No murmurs, rubs, or gallops.  ABDOMEN: Soft,  nontender, nondistended. Bowel sounds present. No organomegaly or mass.  EXTREMITIES: No cyanosis, clubbing or edema b/l.   Right great toe ulceration as shown below, signs of stasis dermatitis on the left lower extremity as shown below.      NEUROLOGIC: Cranial nerves II through XII are intact. No focal Motor or sensory deficits b/l.  Globally weak.  PSYCHIATRIC: The patient is alert and oriented x 3.  SKIN: No obvious rash, lesion, or ulcer.    LABORATORY PANEL:   CBC Recent Labs  Lab 06/23/18 0447  WBC 12.9*  HGB 12.1*  HCT 36.2*  PLT 154   ------------------------------------------------------------------------------------------------------------------  Chemistries  Recent Labs  Lab 06/21/18 1624  06/22/18 0553 06/23/18 0447  NA 134*  --  135 136  K 3.4*  --  2.5* 3.1*  CL 95*  --  98 100  CO2 28  --  26 26  GLUCOSE 176*  --  169* 126*  BUN 29*  --  28* 30*  CREATININE 2.19*   < > 2.22* 1.94*  CALCIUM 8.6*  --  8.0* 8.2*  MG  --   --  1.9  --   AST 33  --   --   --   ALT 20  --   --   --   ALKPHOS 71  --   --   --   BILITOT 1.3*  --   --   --    < > = values in this interval not displayed.   ------------------------------------------------------------------------------------------------------------------  Cardiac Enzymes No results for input(s): TROPONINI in the last 168 hours. ------------------------------------------------------------------------------------------------------------------  RADIOLOGY:  Dg Chest 2 View  Result Date: 06/21/2018 CLINICAL DATA:  Weakness unable walk since last night EXAM: CHEST - 2 VIEW COMPARISON:  April 16, 2018 FINDINGS: The heart size and mediastinal contours are stable. There is no focal infiltrate, pulmonary edema, or pleural effusion. The visualized skeletal structures are unremarkable. IMPRESSION: No active cardiopulmonary disease. Electronically Signed   By: Abelardo Diesel M.D.   On: 06/21/2018 18:07   Dg Toe Great  Right  Result Date: 06/21/2018 CLINICAL DATA:  Initial evaluation for acute infection. EXAM: RIGHT GREAT TOE COMPARISON:  None. FINDINGS: Soft tissue ulceration seen at the distal and plantar aspect of the right first toe. On oblique view, there is subtle cortical irregularity at the distal tuft of the right first distal phalanx, which could reflect acute osteomyelitis. No radiopaque foreign body. No dissecting soft tissue emphysema. No acute fracture or dislocation. Bones are mildly osteopenic. IMPRESSION: Soft tissue ulceration at the distal and plantar aspect of the right great toe, compatible with infection. Subtle osseous irregularity at the distal aspect of the tuft of the right first distal phalanx suspicious for possible mild and/or early osteomyelitis. Electronically Signed   By: Jeannine Boga M.D.   On: 06/21/2018 22:02     ASSESSMENT AND PLAN:   73 year old male with past medical history of diabetes, history of previous CVA, obesity, GERD, history of coronary disease, chronic diastolic CHF, hypertension, hyperlipidemia, peripheral vascular disease who presented to the hospital due to fever, leukocytosis and noted to have a right great toe lesion with x-ray findings suggestive of early him osteomyelitis.  1.  Sepsis-patient met criteria admission given his fever, leukocytosis, tachycardia and right great toe infection with findings suggestive of osteomyelitis. - Cont. IV Ceftriaxone and improving. WBC Count down.  - afebrile hemodynamically stable.   2.  Right great toe infection/osteomyelitis- seen by podiatry and plan for amputation today. -Continue IV antibiotics as mentioned above.  Will follow intraoperative cultures.  3.  Signs of chronic stasis dermatitis- cont. Care as per Wound team.   4.  Hypokalemia- improving with supplementation and will cont. To monitor.   5.  CKD stage IV-creatinine close to baseline and will continue to monitor. -Renal dose meds, avoid  nephrotoxins.  6.  Leukocytosis- improving with IV antibiotic therapy. -We will continue to monitor.  7.  Diabetes type 2 with neuropathy-continue sliding scale insulin. - BS table.   8.  Diabetic neuropathy-continue gabapentin.  9.  History of gout-no acute attack.  Continue Uloric.  10.  GERD-continue Protonix.   All the records are reviewed and case discussed with Care Management/Social Worker. Management plans discussed with the patient, family and they are in agreement.  CODE STATUS: Full code  DVT Prophylaxis: Hep. SQ  TOTAL TIME TAKING CARE OF THIS PATIENT: 30 minutes.   POSSIBLE D/C IN 2-3 DAYS, DEPENDING ON CLINICAL CONDITION.   Henreitta Leber M.D on 06/23/2018 at 11:59 AM  Between 7am to 6pm - Pager - [phone]  After 6pm go to [email or web address] - password [email or web address] [facility]  Office  [phone]  CC: Primary care physician; [name], MD

## 2018-06-23 NOTE — Anesthesia Procedure Notes (Signed)
Date/Time: 06/23/2018 12:12 PM Performed by: Johnna Acosta, CRNA Pre-anesthesia Checklist: Patient identified, Emergency Drugs available, Suction available, Patient being monitored and Timeout performed Patient Re-evaluated:Patient Re-evaluated prior to induction Oxygen Delivery Method: Simple face mask Preoxygenation: Pre-oxygenation with 100% oxygen Induction Type: IV induction

## 2018-06-23 NOTE — Transfer of Care (Signed)
Immediate Anesthesia Transfer of Care Note  Patient: Adam Merritt  Procedure(s) Performed: AMPUTATION TOE (Right )  Patient Location: PACU  Anesthesia Type:MAC  Level of Consciousness: awake, alert  and oriented  Airway & Oxygen Therapy: spontaneous respirations on room air  Post-op Assessment: Report given to RN and Post -op Vital signs reviewed and stable  Post vital signs: Reviewed and stable  Last Vitals:  Vitals Value Taken Time  BP 131/75 06/23/2018 12:57 PM  Temp    Pulse 81 06/23/2018 12:57 PM  Resp 21 06/23/2018 12:57 PM  SpO2 96 % 06/23/2018 12:57 PM  Vitals shown include unvalidated device data.  Last Pain:  Vitals:   06/23/18 0811  TempSrc:   PainSc: 0-No pain         Complications: No apparent anesthesia complications

## 2018-06-23 NOTE — Progress Notes (Signed)
Initial Nutrition Assessment  DOCUMENTATION CODES:   Morbid obesity  INTERVENTION:  Provide Ensure Max protein supplement BID, each supplement provides 150kcal and 30g of protein. Can decrease as appetite/intake improves.  Provide Ocuvite daily for wound healing (provides zinc, vitamin A, vitamin C, Vitamin E, copper, and selenium).  NUTRITION DIAGNOSIS:   Increased nutrient needs related to wound healing as evidenced by estimated needs.  GOAL:   Patient will meet greater than or equal to 90% of their needs  MONITOR:   PO intake, Supplement acceptance, Labs, Weight trends, Skin, I & O's  REASON FOR ASSESSMENT:   Malnutrition Screening Tool    ASSESSMENT:   73 year old male with PMHx of DM, diabetic neuropathy, HTN, HLD, CAD, hx CABG, esophageal reflux, hx CVA, chronic diastolic CHF, CKD stage IV admitted with sepsis, right great toe infection/osteomyelitis s/p partial amputation right great toe 1/19.   Met with patient and his wife at bedside. They are known to our service from a recent admission where we were consulted to educate patient on low sodium diet. He reports his appetite has been declining for a couple weeks now. He is only eating two meals per day lately. He and his wife continue to eat out at restaurants at meals. For breakfast he has grits, eggs, sausage, and toast. For dinner he has [place] (did not specify what he gets) or spaghetti. He reports his appetite is very poor now and he is not eating much at meals. He is unable to eat much protein right now.   UBW around 300 lbs (136.4 kg). Wife reports patient may have lost about 4 lbs the past 1-2 weeks, which would not be significant for time frame.  Medications reviewed and include: Colace 50 mg BID, fentanyl patch, gabapentin, Novolog 0-9 units TID, Novolog 0-5 units QHS, Lantus 30 units daily, pantoprazole, potassium chloride 20 mEq BID, senna-docusate, ceftriaxone.  Labs reviewed: CBG 109-141, Potassium 3.1, BUN  30, Creatinine 1.94, eGFR 33.  Patient does not meet criteria for malnutrition.  NUTRITION - FOCUSED PHYSICAL EXAM:    Most Recent Value  Orbital Region  No depletion  Upper Arm Region  No depletion  Thoracic and Lumbar Region  No depletion  Buccal Region  No depletion  Temple Region  No depletion  Clavicle Bone Region  No depletion  Clavicle and Acromion Bone Region  No depletion  Scapular Bone Region  No depletion  Dorsal Hand  No depletion  Patellar Region  No depletion  Anterior Thigh Region  No depletion  Posterior Calf Region  No depletion  Edema (RD Assessment)  Mild  Hair  Reviewed  Eyes  Reviewed  Mouth  Reviewed  Skin  Reviewed  Nails  Reviewed     Diet Order:   Diet Order            Diet Carb Modified Fluid consistency: Thin; Room service appropriate? Yes  Diet effective now             EDUCATION NEEDS:   Education needs have been addressed  Skin:  Skin Assessment: Reviewed RN Assessment(closed incision right toe)  Last BM:  06/21/2018 per chart  Height:   Ht Readings from Last 1 Encounters:  06/21/18 '5\' 6"'  (1.676 m)   Weight:   Wt Readings from Last 1 Encounters:  06/21/18 134.3 kg   Ideal Body Weight:  64.5 kg  BMI:  Body mass index is 47.79 kg/m.  Estimated Nutritional Needs:   Kcal:  2200-2400  Protein:  110-120 grams  Fluid:  1.9-2.2 L/day  Willey Blade, MS, RD, LDN Office: [phone] Pager: [phone] After Hours/Weekend Pager: [phone]

## 2018-06-23 NOTE — Anesthesia Preprocedure Evaluation (Signed)
Anesthesia Evaluation  Patient identified by MRN, date of birth, ID band Patient awake    Reviewed: Allergy & Precautions, H&P , NPO status , Patient's Chart, lab work & pertinent test results, reviewed documented beta blocker date and time   Airway Mallampati: II  TM Distance: >3 FB Neck ROM: full    Dental no notable dental hx. (+) Teeth Intact   Pulmonary sleep apnea , COPD,    Pulmonary exam normal breath sounds clear to auscultation       Cardiovascular Exercise Tolerance: Poor hypertension, On Medications + CAD, + Peripheral Vascular Disease and +CHF   Rhythm:regular Rate:Normal     Neuro/Psych  Neuromuscular disease CVA negative psych ROS   GI/Hepatic Neg liver ROS, GERD  Medicated,  Endo/Other  negative endocrine ROSdiabetes, Well Controlled, Type 1, Insulin Dependent  Renal/GU CRFRenal disease     Musculoskeletal   Abdominal   Peds  Hematology  (+) Blood dyscrasia, anemia ,   Anesthesia Other Findings   Reproductive/Obstetrics negative OB ROS                             Anesthesia Physical Anesthesia Plan  ASA: III and emergent  Anesthesia Plan: MAC   Post-op Pain Management:    Induction:   PONV Risk Score and Plan:   Airway Management Planned:   Additional Equipment:   Intra-op Plan:   Post-operative Plan:   Informed Consent: I have reviewed the patients History and Physical, chart, labs and discussed the procedure including the risks, benefits and alternatives for the proposed anesthesia with the patient or authorized representative who has indicated his/her understanding and acceptance.       Plan Discussed with: CRNA  Anesthesia Plan Comments:         Anesthesia Quick Evaluation

## 2018-06-24 ENCOUNTER — Inpatient Hospital Stay: Payer: Self-pay

## 2018-06-24 ENCOUNTER — Ambulatory Visit: Payer: Self-pay | Admitting: Nurse Practitioner

## 2018-06-24 ENCOUNTER — Telehealth: Payer: Self-pay

## 2018-06-24 ENCOUNTER — Encounter: Payer: Self-pay | Admitting: Podiatry

## 2018-06-24 DIAGNOSIS — L97519 Non-pressure chronic ulcer of other part of right foot with unspecified severity: Secondary | ICD-10-CM

## 2018-06-24 DIAGNOSIS — Z951 Presence of aortocoronary bypass graft: Secondary | ICD-10-CM

## 2018-06-24 DIAGNOSIS — E11621 Type 2 diabetes mellitus with foot ulcer: Secondary | ICD-10-CM

## 2018-06-24 DIAGNOSIS — L03115 Cellulitis of right lower limb: Secondary | ICD-10-CM

## 2018-06-24 DIAGNOSIS — I2510 Atherosclerotic heart disease of native coronary artery without angina pectoris: Secondary | ICD-10-CM

## 2018-06-24 DIAGNOSIS — B951 Streptococcus, group B, as the cause of diseases classified elsewhere: Secondary | ICD-10-CM

## 2018-06-24 DIAGNOSIS — E785 Hyperlipidemia, unspecified: Secondary | ICD-10-CM

## 2018-06-24 DIAGNOSIS — Z794 Long term (current) use of insulin: Secondary | ICD-10-CM

## 2018-06-24 DIAGNOSIS — R7881 Bacteremia: Secondary | ICD-10-CM

## 2018-06-24 DIAGNOSIS — I890 Lymphedema, not elsewhere classified: Secondary | ICD-10-CM

## 2018-06-24 DIAGNOSIS — L03116 Cellulitis of left lower limb: Secondary | ICD-10-CM

## 2018-06-24 DIAGNOSIS — I110 Hypertensive heart disease with heart failure: Secondary | ICD-10-CM

## 2018-06-24 DIAGNOSIS — Z89411 Acquired absence of right great toe: Secondary | ICD-10-CM

## 2018-06-24 DIAGNOSIS — I509 Heart failure, unspecified: Secondary | ICD-10-CM

## 2018-06-24 LAB — CULTURE, BLOOD (ROUTINE X 2): SPECIAL REQUESTS: ADEQUATE

## 2018-06-24 LAB — CBC
HCT: 35.8 % — ABNORMAL LOW (ref 39.0–52.0)
Hemoglobin: 12 g/dL — ABNORMAL LOW (ref 13.0–17.0)
MCH: 32.2 pg (ref 26.0–34.0)
MCHC: 33.5 g/dL (ref 30.0–36.0)
MCV: 96 fL (ref 80.0–100.0)
Platelets: 154 10*3/uL (ref 150–400)
RBC: 3.73 MIL/uL — ABNORMAL LOW (ref 4.22–5.81)
RDW: 12.7 % (ref 11.5–15.5)
WBC: 7.7 10*3/uL (ref 4.0–10.5)
nRBC: 0 % (ref 0.0–0.2)

## 2018-06-24 LAB — GLUCOSE, CAPILLARY
GLUCOSE-CAPILLARY: 114 mg/dL — AB (ref 70–99)
GLUCOSE-CAPILLARY: 136 mg/dL — AB (ref 70–99)
Glucose-Capillary: 170 mg/dL — ABNORMAL HIGH (ref 70–99)
Glucose-Capillary: 136 mg/dL — ABNORMAL HIGH (ref 70–99)

## 2018-06-24 LAB — BASIC METABOLIC PANEL
Anion gap: 6 (ref 5–15)
BUN: 28 mg/dL — ABNORMAL HIGH (ref 8–23)
CO2: 30 mmol/L (ref 22–32)
Calcium: 8.2 mg/dL — ABNORMAL LOW (ref 8.9–10.3)
Chloride: 103 mmol/L (ref 98–111)
Creatinine, Ser: 1.68 mg/dL — ABNORMAL HIGH (ref 0.61–1.24)
GFR calc non Af Amer: 40 mL/min — ABNORMAL LOW (ref >60)
GFR, EST AFRICAN AMERICAN: 46 mL/min — AB (ref >60)
Glucose, Bld: 113 mg/dL — ABNORMAL HIGH (ref 70–99)
Potassium: 3.2 mmol/L — ABNORMAL LOW (ref 3.5–5.1)
Sodium: 139 mmol/L (ref 135–145)

## 2018-06-24 MED ORDER — CARVEDILOL 25 MG PO TABS
25.0000 mg | ORAL_TABLET | Freq: Two times a day (BID) | ORAL | Status: DC
  Administered 2018-06-24 – 2018-06-25 (×2): 25 mg via ORAL
  Filled 2018-06-24 (×2): qty 1

## 2018-06-24 MED ORDER — POTASSIUM CHLORIDE CRYS ER 20 MEQ PO TBCR
40.0000 meq | EXTENDED_RELEASE_TABLET | Freq: Once | ORAL | Status: AC
  Administered 2018-06-24: 40 meq via ORAL
  Filled 2018-06-24: qty 2

## 2018-06-24 MED ORDER — FUROSEMIDE 40 MG PO TABS
40.0000 mg | ORAL_TABLET | Freq: Every day | ORAL | Status: DC
  Administered 2018-06-24 – 2018-06-25 (×2): 40 mg via ORAL
  Filled 2018-06-24 (×2): qty 1

## 2018-06-24 NOTE — Care Management Important Message (Signed)
Important Message  Patient Details  Name: Adam Merritt MRN: [id number] Date of Birth: 1945/12/11   Medicare Important Message Given:  Yes    Juliann Pulse A Adriyanna Christians 06/24/2018, 11:53 AM

## 2018-06-24 NOTE — Treatment Plan (Signed)
Diagnosis: Group B streptococcus bacteremia Rt great toe ulcer B/l lymphedema /ulceration/cellulitis  Baseline Creatinine < 1    No Known Allergies  OPAT Orders Discharge antibiotics: Ceftriaxone 2 grams IV every 24 hours Duration: 2 weeks End Date: 07/07/18  [facility] Care Per Protocol:  Labs weekly on Monday while on IV antibiotics: __X CBC with differential  _X_ CMP   _X_ Please pull PIC at completion of IV antibiotics  Fax weekly labs to [phone]  Clinic Follow Up Appt:2 weeks-Call [phone] to make appt   Call [phone] with critical values or any other questions

## 2018-06-24 NOTE — Plan of Care (Signed)

## 2018-06-24 NOTE — Telephone Encounter (Signed)
PT REFUSED Novi

## 2018-06-24 NOTE — Consult Note (Signed)
NAME: Adam Merritt  DOB: 08-02-1945  MRN: [id number]  Date/Time: 06/24/2018 5:03 PM  Adam Merritt Subjective:  REASON FOR CONSULT: Group B streptococcus bacteremia ? Adam Merritt is a 73 y.o. male with a history of diabetes mellitus, hyperlipidemia, hypertension, coronary artery disease, B/l Edema legs  was admitted on 06/21/2018 with weakness, bilateral leg pain and unable to walk.  Patient has been followed by wound care clinic for bilateral lower extremity edema and right toe and left lower extremity ulceration.  On admission his blood pressure was 107/65 and temperature of 98, blood culture was drawn, WBC was 27.3, creatinine was 2.19 and x-ray of the toe revealed osseous irregularity of the distal aspect of the tuft of the right first distal phalanx suspicious for early osteomyelitis.  There was an ulcer on the great toe rt foot and b/l lower leg edema and superficial ulceration. He was started on IV vancomycin and ceftriaxone.  He was seen by podiatry who took him for partial amputation of that toe on 1 /19/2020 The blood culture is positive for Group B streptococcus and I am asked to see the patient. He says he has had chronic lymphedema of the legs and has been going to the Wound clinic for many years now. 3 -4 weeks ago he hurt his rt great toe and developed a wound and has been seen at the clinic with topical treatment. He did not receive any antibiotics. He last saw them on 1/14. After that he had  been feeling weak, unable to walk and his family brought him to the ED. No fever . The legs were more swollen and red.  Past Medical History:  Diagnosis Date  . Anemia   . CAD (coronary artery disease)   . Chest pain   . Coronary artery disease   . Diabetes mellitus without complication ([place])   . Diastolic CHF ([place])   . Esophageal reflux   . Herpes zoster without mention of complication   . Hyperlipidemia   . Hypertension   . Insomnia   . Lumbago   . Lymphedema   . Neuropathy in diabetes  ([place])   . Obstructive chronic bronchitis without exacerbation ([place])   . Other malaise and fatigue   . Stroke ([place])   . Varicose veins     Past Surgical History:  Procedure Laterality Date  . 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 CHOLECYSTECTOMY    . 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 Diabetes Mother   . Hyperlipidemia Mother   . Hypertension Mother   . Diabetes Father   . Hyperlipidemia Father   . Hypertension Father   . CAD Father    No Known Allergies ? Current Facility-Administered Medications  Medication Dose Route Frequency Provider Last Rate Last Dose  . 0.9 %  sodium chloride infusion   Intravenous PRN Sharlotte Alamo, DPM 0 mL/hr at 06/22/18 1719    . acetaminophen (TYLENOL) tablet 650 mg  650 mg Oral Q6H PRN Sharlotte Alamo, DPM       Or  . acetaminophen (TYLENOL) suppository 650 mg  650 mg Rectal Q6H PRN Sharlotte Alamo, DPM      . albuterol (PROVENTIL) (2.5 MG/3ML) 0.083% nebulizer solution 2.5  mg  2.5 mg Nebulization Q2H PRN Sharlotte Alamo, DPM      . aspirin EC tablet 325 mg  325 mg Oral Daily Sharlotte Alamo, DPM   325 mg at 06/24/18 0840  . atorvastatin (LIPITOR) tablet 40 mg  40 mg Oral QHS Sharlotte Alamo, DPM   40 mg at 06/23/18 2126  . bisacodyl (DULCOLAX) EC tablet 5 mg  5 mg Oral Daily PRN Sharlotte Alamo, DPM   5 mg at 06/24/18 1256  . carvedilol (COREG) tablet 25 mg  25 mg Oral BID WC Adam Merritt, Belia Heman, MD      . cefTRIAXone (ROCEPHIN) 2 g in sodium chloride 0.9 % 100 mL IVPB  2 g Intravenous Q24H Sharlotte Alamo, DPM   Stopped at 06/24/18 1256  . docusate (COLACE) 50 MG/5ML liquid 50 mg  50 mg Oral BID Sharlotte Alamo, DPM   50 mg at 06/23/18 1423  . febuxostat (ULORIC) tablet 40 mg  40 mg Oral Daily Sharlotte Alamo, DPM   40  mg at 06/24/18 0841  . fentaNYL (DURAGESIC) 50 MCG/HR 1 patch  1 patch Transdermal Q72H Sharlotte Alamo, DPM   1 patch at 06/21/18 2358  . furosemide (LASIX) tablet 40 mg  40 mg Oral Daily Henreitta Leber, MD   40 mg at 06/24/18 1255  . gabapentin (NEURONTIN) capsule 300 mg  300 mg Oral BID Sharlotte Alamo, DPM   300 mg at 06/24/18 0841  . gabapentin (NEURONTIN) capsule 600 mg  600 mg Oral QHS Sharlotte Alamo, DPM   600 mg at 06/23/18 2127  . heparin injection 5,000 Units  5,000 Units Subcutaneous Q8H Sharlotte Alamo, DPM   5,000 Units at 06/24/18 1255  . HYDROcodone-acetaminophen (NORCO/VICODIN) 5-325 MG per tablet 1-2 tablet  1-2 tablet Oral Q4H PRN Sharlotte Alamo, DPM   2 tablet at 06/24/18 1256  . indapamide (LOZOL) tablet 2.5 mg  2.5 mg Oral Daily Sharlotte Alamo, DPM   2.5 mg at 06/24/18 0841  . insulin aspart (novoLOG) injection 0-5 Units  0-5 Units Subcutaneous QHS Sharlotte Alamo, DPM      . insulin aspart (novoLOG) injection 0-9 Units  0-9 Units Subcutaneous TID WC Sharlotte Alamo, DPM   2 Units at 06/24/18 1209  . insulin glargine (LANTUS) injection 30 Units  30 Units Subcutaneous Daily Sharlotte Alamo, DPM   30 Units at 06/24/18 0840  . multivitamin-lutein (OCUVITE-LUTEIN) capsule 1 capsule  1 capsule Oral Daily Henreitta Leber, MD   1 capsule at 06/24/18 0840  . ondansetron (ZOFRAN) tablet 4 mg  4 mg Oral Q6H PRN Sharlotte Alamo, DPM       Or  . ondansetron [facility]) injection 4 mg  4 mg Intravenous Q6H PRN Sharlotte Alamo, DPM      . pantoprazole (PROTONIX) EC tablet 40 mg  40 mg Oral Daily Sharlotte Alamo, DPM   40 mg at 06/24/18 0840  . pentoxifylline (TRENTAL) CR tablet 400 mg  400 mg Oral TID Sharlotte Alamo, DPM   400 mg at 06/24/18 0840  . protein supplement (ENSURE MAX) liquid  11 oz Oral BID Henreitta Leber, MD   11 oz at 06/24/18 1212  . senna-docusate (Senokot-S) tablet 1 tablet  1 tablet Oral QHS PRN Sharlotte Alamo, DPM      . senna-docusate (Senokot-S) tablet 1-2 tablet  1-2 tablet Oral BID Sharlotte Alamo, DPM   1 tablet at  06/24/18 [phone]  . zolpidem (AMBIEN) tablet 5 mg  5 mg Oral QHS PRN Sharlotte Alamo, DPM  5 mg at 06/23/18 2126     Abtx:  Anti-infectives (From admission, onward)   Start     Dose/Rate Route Frequency Ordered Stop   06/23/18 2000  vancomycin (VANCOCIN) 1,750 mg in sodium chloride 0.9 % 500 mL IVPB  Status:  Discontinued     1,750 mg 250 mL/hr over 120 Minutes Intravenous Every 48 hours 06/21/18 1944 06/22/18 0532   06/22/18 1300  cefTRIAXone (ROCEPHIN) 2 g in sodium chloride 0.9 % 100 mL IVPB     2 g 200 mL/hr over 30 Minutes Intravenous Every 24 hours 06/22/18 0532     06/21/18 2200  piperacillin-tazobactam (ZOSYN) 4.5 g in sodium chloride 0.9 % 100 mL injection  Status:  Discontinued     25 mL/hr over 240 Minutes Intravenous Every 8 hours 06/21/18 1944 06/22/18 0532   06/21/18 2030  vancomycin (VANCOCIN) IVPB 1000 mg/200 mL premix     1,000 mg 200 mL/hr over 60 Minutes Intravenous  Once 06/21/18 1938 06/21/18 2229   06/21/18 1945  piperacillin-tazobactam (ZOSYN) IVPB 3.375 g     3.375 g 100 mL/hr over 30 Minutes Intravenous  Once 06/21/18 1938 06/21/18 2024   06/21/18 1815  cefTRIAXone (ROCEPHIN) 1 g in sodium chloride 0.9 % 100 mL IVPB     1 g 200 mL/hr over 30 Minutes Intravenous  Once 06/21/18 1811 06/21/18 1948   06/21/18 1815  vancomycin (VANCOCIN) IVPB 1000 mg/200 mL premix     1,000 mg 200 mL/hr over 60 Minutes Intravenous  Once 06/21/18 1811 06/21/18 2024      REVIEW OF SYSTEMS:  Const: negative fever, negative chills, negative weight loss Eyes: negative diplopia or visual changes, negative eye pain ENT: negative coryza, negative sore throat Resp: negative cough, hemoptysis, has  dyspnea Cards: negative for chest pain, palpitations,has  lower extremity edema GU: negative for frequency, dysuria and hematuria GI: Negative for abdominal pain, diarrhea, bleeding, constipation Skin: b/l skin lesions legs Heme: negative for easy bruising and gum/nose bleeding MS: positive for  myalgias, arthralgias,  and muscle weakness Neurolo:negative for headaches, dizziness, vertigo, memory problems  Psych: negative for feelings of anxiety, depression  Endocrine:no polyuria or polydipsia Allergy/Immunology- no antibiotic allergy No hardware Objective:  VITALS:  BP (!) 165/87   Pulse 70   Temp 97.7 F (36.5 C) (Oral)   Resp 19   Ht 5\' 6"  (1.676 m)   Wt 134.3 kg   SpO2 97%   BMI 47.79 kg/m  PHYSICAL EXAM:  General: Alert, cooperative, no distress, appears stated age. Morbid obesity Head: Normocephalic, without obvious abnormality, atraumatic. Rt facial palsy Eyes: Conjunctivae clear, anicteric sclerae. Pupils are equal ENT Nares normal. No drainage or sinus tenderness. Lips, mucosa, and tongue normal. No Thrush Neck: Supple, symmetrical, no adenopathy, thyroid: non tender no carotid bruit and no JVD. Soft pedunculated lump of 3 cm rt shoulder area Lungs: b/l air entry Heart: s1s2, sternal scar Abdomen: Soft, non-tender,not distended. Bowel sounds normal. No masses Extremities:b/l lymphedema  06/24/18   06/24/18   06/21/18   06/21/18    Skin: as above Lymph: Cervical, supraclavicular normal. Neurologic: Grossly non-focal Pertinent Labs Lab Results CBC CBC Latest Ref Rng & Units 06/24/2018 06/23/2018 06/22/2018  WBC 4.0 - 10.5 K/uL 7.7 12.9(H) 21.3(H)  Hemoglobin 13.0 - 17.0 g/dL 12.0(L) 12.1(L) 12.6(L)  Hematocrit 39.0 - 52.0 % 35.8(L) 36.2(L) 37.5(L)  Platelets 150 - 400 K/uL 154 154 174      Component Value Date/Time   WBC 7.7 06/24/2018 0344   RBC  3.73 (L) 06/24/2018 0344   HGB 12.0 (L) 06/24/2018 0344   HGB 13.8 09/28/2014 1320   HCT 35.8 (L) 06/24/2018 0344   HCT 40.9 09/28/2014 1320   PLT 154 06/24/2018 0344   PLT 142 (L) 09/28/2014 1320   MCV 96.0 06/24/2018 0344   MCV 92 09/28/2014 1320   MCH 32.2 06/24/2018 0344   MCHC 33.5 06/24/2018 0344   RDW 12.7 06/24/2018 0344   RDW 13.5 09/28/2014 1320   LYMPHSABS 0.6 (L) 06/21/2018 1624     LYMPHSABS 1.7 09/28/2014 1320   MONOABS 0.9 06/21/2018 1624   MONOABS 0.6 09/28/2014 1320   EOSABS 0.0 06/21/2018 1624   EOSABS 0.3 09/28/2014 1320   BASOSABS 0.1 06/21/2018 1624   BASOSABS 0.0 09/28/2014 1320    CMP Latest Ref Rng & Units 06/24/2018 06/23/2018 06/22/2018  Glucose 70 - 99 mg/dL 113(H) 126(H) 169(H)  BUN 8 - 23 mg/dL 28(H) 30(H) 28(H)  Creatinine 0.61 - 1.24 mg/dL 1.68(H) 1.94(H) 2.22(H)  Sodium 135 - 145 mmol/L 139 136 135  Potassium 3.5 - 5.1 mmol/L 3.2(L) 3.1(L) 2.5(LL)  Chloride 98 - 111 mmol/L 103 100 98  CO2 22 - 32 mmol/L 30 26 26   Calcium 8.9 - 10.3 mg/dL 8.2(L) 8.2(L) 8.0(L)  Total Protein 6.5 - 8.1 g/dL - - -  Total Bilirubin 0.3 - 1.2 mg/dL - - -  Alkaline Phos 38 - 126 U/L - - -  AST 15 - 41 U/L - - -  ALT 0 - 44 U/L - - -      Microbiology: Recent Results (from the past 240 hour(s))  Blood Culture (routine x 2)     Status: Abnormal   Collection Time: 06/21/18  7:14 PM  Result Value Ref Range Status   Specimen Description   Final    BLOOD LEFT ARM Performed at [facility], [address], [place], [place] [address]    Special Requests   Final    BOTTLES DRAWN AEROBIC AND ANAEROBIC Blood Culture results may not be optimal due to an excessive volume of blood received in culture bottles Performed at [facility], [address], [place], [place] [address]    Culture  Setup Time   Final    GRAM POSITIVE COCCI IN BOTH AEROBIC AND ANAEROBIC BOTTLES CRITICAL RESULT CALLED TO, READ BACK BY AND VERIFIED WITH: DAVID BESANTI ON 06/22/2018 AT 3734 QSD Performed at [facility], [place] [address] [place], [place] [address]    Culture GROUP B STREP(S.AGALACTIAE)ISOLATED (A)  Final   Report Status 06/24/2018 FINAL  Final   Organism ID, Bacteria GROUP B STREP(S.AGALACTIAE)ISOLATED  Final      Susceptibility   Group b strep(s.agalactiae)isolated - MIC*    CLINDAMYCIN >=1 RESISTANT Resistant     AMPICILLIN <=0.25 SENSITIVE  Sensitive     ERYTHROMYCIN >=8 RESISTANT Resistant     VANCOMYCIN 0.5 SENSITIVE Sensitive     CEFTRIAXONE <=0.12 SENSITIVE Sensitive     LEVOFLOXACIN 1 SENSITIVE Sensitive     * GROUP B STREP(S.AGALACTIAE)ISOLATED  Blood Culture (routine x 2)     Status: Abnormal   Collection Time: 06/21/18  7:14 PM  Result Value Ref Range Status   Specimen Description   Final    BLOOD RIGHT WRIST Performed at [facility], [address], [place], [place] [address]    Special Requests   Final    BOTTLES DRAWN AEROBIC AND ANAEROBIC Blood Culture adequate volume Performed at [facility], [address] [place], [place] [address]    Culture  Setup Time   Final    GRAM POSITIVE COCCI IN BOTH AEROBIC AND ANAEROBIC BOTTLES CRITICAL RESULT CALLED TO, READ BACK BY AND VERIFIED WITH: DAVID BESANTI AT 2536 06/22/2018. QSD/PMF Performed at [facility], [address] [place], [place] [address]    Culture (A)  Final    GROUP B STREP(S.AGALACTIAE)ISOLATED SUSCEPTIBILITIES PERFORMED ON PREVIOUS CULTURE WITHIN THE LAST 5 DAYS. Performed at [facility], [place] [address], [place], [place] [address]    Report Status 06/24/2018 FINAL  Final  Blood Culture ID Panel (Reflexed)     Status: Abnormal   Collection Time: 06/21/18  7:14 PM  Result Value Ref Range Status   Enterococcus species NOT DETECTED NOT DETECTED Final   Listeria monocytogenes NOT DETECTED NOT DETECTED Final   Staphylococcus species NOT DETECTED NOT DETECTED Final   Staphylococcus aureus (BCID) NOT DETECTED NOT DETECTED Final   Streptococcus species DETECTED (A) NOT DETECTED Final    Comment: CRITICAL RESULT CALLED TO, READ BACK BY AND VERIFIED WITH: DAVID BESANTI ON 06/22/2018 AT 0523 QSD    Streptococcus agalactiae DETECTED (A) NOT DETECTED Final    Comment: CRITICAL RESULT CALLED TO, READ BACK BY AND VERIFIED WITH: DAVID BESANTI ON 06/22/2018 AT 0523 QSD    Streptococcus pneumoniae NOT DETECTED NOT  DETECTED Final   Streptococcus pyogenes NOT DETECTED NOT DETECTED Final   Acinetobacter baumannii NOT DETECTED NOT DETECTED Final   Enterobacteriaceae species NOT DETECTED NOT DETECTED Final   Enterobacter cloacae complex NOT DETECTED NOT DETECTED Final   Escherichia coli NOT DETECTED NOT DETECTED Final   Klebsiella oxytoca NOT DETECTED NOT DETECTED Final   Klebsiella pneumoniae NOT DETECTED NOT DETECTED Final   Proteus species NOT DETECTED NOT DETECTED Final   Serratia marcescens NOT DETECTED NOT DETECTED Final   Haemophilus influenzae NOT DETECTED NOT DETECTED Final   Neisseria meningitidis NOT DETECTED NOT DETECTED Final   Pseudomonas aeruginosa NOT DETECTED NOT DETECTED Final   Candida albicans NOT DETECTED NOT DETECTED Final   Candida glabrata NOT DETECTED NOT DETECTED Final   Candida krusei NOT DETECTED NOT DETECTED Final   Candida parapsilosis NOT DETECTED NOT DETECTED Final   Candida tropicalis NOT DETECTED NOT DETECTED Final    Comment: Performed at [facility], [address] [place], [place] [address]  Aerobic/Anaerobic Culture (surgical/deep wound)     Status: None (Preliminary result)   Collection Time: 06/23/18 12:34 PM  Result Value Ref Range Status   Specimen Description   Final    TISSUE Performed at [facility], [address] [place], [place] [address]    Special Requests   Final    NONE Performed at [facility], [address] [place], [place] [address]    Gram Stain NO WBC SEEN NO ORGANISMS SEEN   Final   Culture   Final    CULTURE REINCUBATED FOR BETTER GROWTH Performed at [facility], [place] [address], [place], [place] [address]    Report Status PENDING  Incomplete   IMAGING RESULTS: ?CXR-N Rt foot xray reviewed personally Subtle osseous irregularity at the distal aspect of the tuft of the right first distal phalanx suspicious for possible mild and/or early osteomyelitis  Impression/Recommendation ?73 y.o.  male with a history of diabetes mellitus, hyperlipidemia, hypertension, coronary artery disease, B/l Edema legs  was admitted on 06/21/2018 with weakness, bilateral leg pain and unable to walk.  Patient has been followed by wound  care clinic for bilateral lower extremity edema and right toe and left lower extremity ulceration ? ? __Group B streptococcus bacteremia-source is the rt great toe ulceration/infection and b/l leg ulceration. He is on ceftriaxone and will need for 2 weeks- will need PICC  Rt great toe ulceration of 1 month with early osteomyelitis - was followed at the wound clinic- now s/p partial amputation of the great toe. 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

## 2018-06-24 NOTE — Progress Notes (Signed)
1 Day Post-Op   Subjective/Chief Complaint: Patient seen.  Relates some pain with the toe but manageable with medication.  Complains of some drainage from the sores on his legs.   Objective: Vital signs in last 24 hours: Temp:  [97.7 F (36.5 C)-98.9 F (37.2 C)] 98 F (36.7 C) (01/20 0934) Pulse Rate:  [69-89] 72 (01/20 0934) Resp:  [16-23] 21 (01/20 0934) BP: (131-156)/(65-87) 146/71 (01/20 0934) SpO2:  [92 %-98 %] 97 % (01/20 0934) Last BM Date: 06/21/18(lax given)  Intake/Output from previous day: 01/19 0701 - 01/20 0700 In: 440 [P.O.:240; I.V.:200] Out: 953 [Urine:950; Blood:3] Intake/Output this shift: Total I/O In: 120 [P.O.:120] Out: -   The bandage on the right great toe and foot is dry and intact.  Upon removal minimal bleeding.  The incision is well coapted with no cellulitis.  Some bleeding and drainage from the leg ulcerations bilateral.  Lab Results:  Recent Labs    06/23/18 0447 06/24/18 0344  WBC 12.9* 7.7  HGB 12.1* 12.0*  HCT 36.2* 35.8*  PLT 154 154   BMET Recent Labs    06/23/18 0447 06/24/18 0344  NA 136 139  K 3.1* 3.2*  CL 100 103  CO2 26 30  GLUCOSE 126* 113*  BUN 30* 28*  CREATININE 1.94* 1.68*  CALCIUM 8.2* 8.2*   PT/INR Recent Labs    06/21/18 2135  LABPROT 14.5  INR 1.14   ABG No results for input(s): PHART, HCO3 in the last 72 hours.  Invalid input(s): PCO2, PO2  Studies/Results: No results found.  Anti-infectives: Anti-infectives (From admission, onward)   Start     Dose/Rate Route Frequency Ordered Stop   06/23/18 2000  vancomycin (VANCOCIN) 1,750 mg in sodium chloride 0.9 % 500 mL IVPB  Status:  Discontinued     1,750 mg 250 mL/hr over 120 Minutes Intravenous Every 48 hours 06/21/18 1944 06/22/18 0532   06/22/18 1300  cefTRIAXone (ROCEPHIN) 2 g in sodium chloride 0.9 % 100 mL IVPB     2 g 200 mL/hr over 30 Minutes Intravenous Every 24 hours 06/22/18 0532     06/21/18 2200  piperacillin-tazobactam (ZOSYN) 4.5  g in sodium chloride 0.9 % 100 mL injection  Status:  Discontinued     25 mL/hr over 240 Minutes Intravenous Every 8 hours 06/21/18 1944 06/22/18 0532   06/21/18 2030  vancomycin (VANCOCIN) IVPB 1000 mg/200 mL premix     1,000 mg 200 mL/hr over 60 Minutes Intravenous  Once 06/21/18 1938 06/21/18 2229   06/21/18 1945  piperacillin-tazobactam (ZOSYN) IVPB 3.375 g     3.375 g 100 mL/hr over 30 Minutes Intravenous  Once 06/21/18 1938 06/21/18 2024   06/21/18 1815  cefTRIAXone (ROCEPHIN) 1 g in sodium chloride 0.9 % 100 mL IVPB     1 g 200 mL/hr over 30 Minutes Intravenous  Once 06/21/18 1811 06/21/18 1948   06/21/18 1815  vancomycin (VANCOCIN) IVPB 1000 mg/200 mL premix     1,000 mg 200 mL/hr over 60 Minutes Intravenous  Once 06/21/18 1811 06/21/18 2024      Assessment/Plan: s/p Procedure(s): AMPUTATION TOE (Right) Assessment: Stable status post amputation right hallux   Plan: Betadine and a sterile gauze bandage reapplied to the right great toe and forefoot.  From a foot standpoint he should be stable for discharge.  Instructed to keep the bandage clean, dry, and do not remove.  Follow-up outpatient in 1 week with suture removal the following week.  I would recommend consultation with the  wound care nurse for evaluation and treatment of the chronic stasis dermatitis ulcerations on his lower extremities.  States he had to cancel his wound care appointment for tomorrow since he is still in the hospital.  Stable for discharge from a podiatry standpoint.  LOS: 3 days    Durward Fortes 06/24/2018

## 2018-06-24 NOTE — Care Management Note (Addendum)
Case Management Note  Patient Details  Name: Adam Merritt MRN: [id number] Date of Birth: 12/23/1945  Subjective/Objective:                  Admitted to [facility] with the diagnosis of sepsis. Lives with wife, Bethena Roys [phone]). Last seen Dr. Humphrey Rolls about a month ago. Prescriptions are filled at [facility] near [facility]. Currently open with [facility] (SN & PT). [facility] in the past. No skilled nursing. Noctural oxygen 2 liters per nasal cannula x 1 year per [place]? [facility].  Rolling walker, 2 canes, raised toilet seat and ramps at the home.  Self feed, self baths, needs some help with dressing.  Doesn't drive x 6 months.  Goes to the [place].  Great toe was amputated 06/23/18.   Action/Plan: Physical therapy evaluation completed. Recommending home with home health/physical therapy.      States he really would like [facility] instead of [facility] if possible.    Expected Discharge Date:                  Expected Discharge Plan:     In-House Referral:     Discharge planning Services   yes  Post Acute Care Choice:   yes Choice offered to:   patient  DME Arranged:    DME Agency:     HH Arranged:    HH Agency:     Status of Service:     If discussed at [employer] of Stay Meetings, dates discussed:    Additional Comments:  Shelbie Ammons, RN MSN CCM Care Management [phone] 06/24/2018, 10:48 AM

## 2018-06-24 NOTE — Progress Notes (Signed)
[facility] [place] NAME: Adam Merritt    MR#:  [id number]  DATE OF BIRTH:  [birth year]  SUBJECTIVE:   Patient presented to the hospital due to fever, leukocytosis and right great toe redness induration suspected to be cellulitis/osteomyelitis.  Status post partial amputation of the right great toe postop day #1 today.  Afebrile, hemodynamically stable.  White cell count has normalized.  REVIEW OF SYSTEMS:    Review of Systems  Constitutional: Negative for chills and fever.  HENT: Negative for congestion and tinnitus.   Eyes: Negative for blurred vision and double vision.  Respiratory: Negative for cough, shortness of breath and wheezing.   Cardiovascular: Negative for chest pain, orthopnea and PND.  Gastrointestinal: Negative for abdominal pain, diarrhea, nausea and vomiting.  Genitourinary: Negative for dysuria and hematuria.  Neurological: Positive for weakness (generalized. ). Negative for dizziness, sensory change and focal weakness.  All other systems reviewed and are negative.   Nutrition: Heart Healthy/Carb control Tolerating diet: Yes Tolerating PT: Eval noted.   DRUG ALLERGIES:  No Known Allergies  VITALS:  Blood pressure (!) 146/71, pulse 72, temperature 98 F (36.7 C), temperature source Oral, resp. rate (!) 21, height '5\' 6"'  (1.676 m), weight 134.3 kg, SpO2 97 %.  PHYSICAL EXAMINATION:   Physical Exam  GENERAL:  73 y.o.-year-old obese patient lying in bed in no acute distress.  EYES: Pupils equal, round, reactive to light and accommodation. No scleral icterus. Extraocular muscles intact.  HEENT: Head atraumatic, normocephalic. Oropharynx and nasopharynx clear.  NECK:  Supple, no jugular venous distention. No thyroid enlargement, no tenderness.  LUNGS: Normal breath sounds bilaterally, no wheezing, rales, rhonchi. No use of accessory muscles of respiration.  CARDIOVASCULAR: S1, S2 normal. No murmurs, rubs, or gallops.   ABDOMEN: Soft, nontender, nondistended. Bowel sounds present. No organomegaly or mass.  EXTREMITIES: No cyanosis, clubbing or edema b/l.   Dressing on the right foot from recent partial right great toe amputation.  Picture below is preoperatively.  Signs of stasis dermatitis on the left lower extremity as shown below.      NEUROLOGIC: Cranial nerves II through XII are intact. No focal Motor or sensory deficits b/l.  Globally weak.  PSYCHIATRIC: The patient is alert and oriented x 3.  SKIN: No obvious rash, lesion, or ulcer.    LABORATORY PANEL:   CBC Recent Labs  Lab 06/24/18 0344  WBC 7.7  HGB 12.0*  HCT 35.8*  PLT 154   ------------------------------------------------------------------------------------------------------------------  Chemistries  Recent Labs  Lab 06/21/18 1624  06/22/18 0553  06/24/18 0344  NA 134*  --  135   < > 139  K 3.4*  --  2.5*   < > 3.2*  CL 95*  --  98   < > 103  CO2 28  --  26   < > 30  GLUCOSE 176*  --  169*   < > 113*  BUN 29*  --  28*   < > 28*  CREATININE 2.19*   < > 2.22*   < > 1.68*  CALCIUM 8.6*  --  8.0*   < > 8.2*  MG  --   --  1.9  --   --   AST 33  --   --   --   --   ALT 20  --   --   --   --   ALKPHOS 71  --   --   --   --  BILITOT 1.3*  --   --   --   --    < > = values in this interval not displayed.   ------------------------------------------------------------------------------------------------------------------  Cardiac Enzymes No results for input(s): TROPONINI in the last 168 hours. ------------------------------------------------------------------------------------------------------------------  RADIOLOGY:  No results found.   ASSESSMENT AND PLAN:   73 year old male with past medical history of diabetes, history of previous CVA, obesity, GERD, history of coronary disease, chronic diastolic CHF, hypertension, hyperlipidemia, peripheral vascular disease who presented to the hospital due to fever,  leukocytosis and noted to have a right great toe lesion with x-ray findings suggestive of early him osteomyelitis.  1.  Sepsis-patient met criteria admission given his fever, leukocytosis, tachycardia and right great toe infection with findings suggestive of osteomyelitis. - Cont. IV Ceftriaxone and improving. WBC Count down.  Patient's blood cultures are positive for group B strep.  Sensitive to ceftriaxone will continue.  We will get infectious disease consult to help with longevity of antibiotics.   2.  Right great toe infection/osteomyelitis- seen by podiatry status post partial amputation of the right great toe postop day # 1 today. -Continue IV ceftriaxone as mentioned above.  Await ID input.  Intraoperative cultures are still pending. -Continue IV antibiotics as mentioned above.  Will follow intraoperative cultures.  3.  Signs of chronic stasis dermatitis- cont. Care as per Wound team.   4.  Hypokalemia- will supplement and repeat in a.m. tomorrow.    5.  CKD stage IV-creatinine close to baseline and will continue to monitor. -Renal dose meds, avoid nephrotoxins.  6.  Essential HtN - will resume Coreg, Lasix for now.  - Cr. At baseline.  REsume Hydralazine in a.m.   7.  Diabetes type 2 with neuropathy-continue sliding scale insulin. - BS table.   8.  Diabetic neuropathy-continue gabapentin.  9.  History of gout-no acute attack.  Continue Uloric.  10.  GERD-continue Protonix.  11. Hyperlipidemia - cont. ATorvastatin    All the records are reviewed and case discussed with Care Management/Social Worker. Management plans discussed with the patient, family and they are in agreement.  CODE STATUS: Full code  DVT Prophylaxis: Hep. SQ  TOTAL TIME TAKING CARE OF THIS PATIENT: 30 minutes.   POSSIBLE D/C IN 1-2 DAYS, DEPENDING ON CLINICAL CONDITION.   Henreitta Leber M.D on 06/24/2018 at 12:27 PM  Between 7am to 6pm - Pager - [phone]  After 6pm go to [email or web address] -  password [email or web address] [facility]  Office  [phone]  CC: Primary care physician; [name], MD

## 2018-06-24 NOTE — Evaluation (Signed)
Physical Therapy Evaluation Patient Details Name: Adam Merritt MRN: [id number] DOB: 1945/09/25 Today's Date: 06/24/2018   History of Present Illness  Patient is a 73 year old male admitted with Left LE cellulitis. S/P partial amputation of right great toe, general weakness. PMH to include: HTN, DM, CAD, CHF, HLD, PN, COPD.       Clinical Impression  Patient received in bed, pleasant, agrees to PT evaluation. Patient performs bed mobility with modified independence, use of rails. Patient is able to transfer sit >< stand safely with cues, bed elevated to improve ease of transfer. Patient ambulated 5 feet from bed to recliner with rw and min guard assist. Post op shoe donned on right with instruction to weight bear through heel on right. Patient compliant with instructions. Left LE bleeding after walking, RN notified.  Patient will benefit from continued PT to address weakness, decreased ambulation and decreased activity tolerance.      Follow Up Recommendations Home health PT;Supervision for mobility/OOB;Supervision/Assistance - 24 hour    Equipment Recommendations  None recommended by PT    Recommendations for Other Services       Precautions / Restrictions Precautions Precautions: Fall Precaution Comments: post op sho on right foot   Restrictions Weight Bearing Restrictions: No Other Position/Activity Restrictions: Heel WB on right      Mobility  Bed Mobility Overal bed mobility: Modified Independent             General bed mobility comments: use of bed rails to assist  Transfers Overall transfer level: Modified independent Equipment used: Rolling walker (2 wheeled)             General transfer comment: Bed elevatd for ease of transfer  Ambulation/Gait Ambulation/Gait assistance: Modified independent (Device/Increase time);Min guard Gait Distance (Feet): 5 Feet Assistive device: Rolling walker (2 wheeled) Gait Pattern/deviations: Step-to pattern;Decreased step  length - right;Decreased step length - left Gait velocity: decrased   General Gait Details: reports some pain when WB on right, knee wanting to give out, therefore declined further distance this day.   Stairs            Wheelchair Mobility    Modified Rankin (Stroke Patients Only)       Balance Overall balance assessment: Modified Independent                                           Pertinent Vitals/Pain Pain Assessment: 0-10 Pain Score: 2  Pain Descriptors / Indicators: Aching;Discomfort;Operative site guarding Pain Intervention(s): Monitored during session;Premedicated before session    [place] expects to be discharged to:: Private residence Living Arrangements: Spouse/significant other Available Help at Discharge: Family;Available 24 hours/day Type of Home: House Home Access: Ramped entrance     Home Layout: One level Home Equipment: Walker - 4 wheels;Cane - single point;Wheelchair - manual;Grab bars - tub/shower;Shower seat - built in;Shower seat      Prior Function Level of Independence: Independent with assistive device(s)         Comments: Pt uses SPC outside and 4ww inside but has had increased difficulty walking last 3 weeks (been holding onto walls and furniture for support with limited household distances.     Hand Dominance        Extremity/Trunk Assessment   Upper Extremity Assessment Upper Extremity Assessment: Overall WFL for tasks assessed    Lower Extremity Assessment Lower Extremity Assessment:  Overall WFL for tasks assessed       Communication   Communication: HOH  Cognition Arousal/Alertness: Awake/alert Behavior During Therapy: WFL for tasks assessed/performed Overall Cognitive Status: Within Functional Limits for tasks assessed                                        General Comments      Exercises     Assessment/Plan    PT Assessment Patient needs continued PT  services  PT Problem List Decreased strength;Decreased mobility;Decreased activity tolerance;Decreased balance;Impaired sensation;Pain;Decreased knowledge of precautions;Decreased safety awareness;Obesity       PT Treatment Interventions DME instruction;Functional mobility training;Balance training;Patient/family education;Gait training;Therapeutic activities;Neuromuscular re-education;Therapeutic exercise    PT Goals (Current goals can be found in the Care Plan section)  Acute Rehab PT Goals Patient Stated Goal: to go home PT Goal Formulation: With patient Time For Goal Achievement: 07/08/18 Potential to Achieve Goals: Good    Frequency Min 2X/week   Barriers to discharge        Co-evaluation               AM-PAC PT "6 Clicks" Mobility  Outcome Measure Help needed turning from your back to your side while in a flat bed without using bedrails?: A Little Help needed moving from lying on your back to sitting on the side of a flat bed without using bedrails?: A Little Help needed moving to and from a bed to a chair (including a wheelchair)?: A Little Help needed standing up from a chair using your arms (e.g., wheelchair or bedside chair)?: A Little Help needed to walk in hospital room?: A Little Help needed climbing 3-5 steps with a railing? : A Lot 6 Click Score: 17    End of Session Equipment Utilized During Treatment: Gait belt Activity Tolerance: Patient tolerated treatment well;Patient limited by pain;Patient limited by fatigue Patient left: in chair;with call bell/phone within reach;with chair alarm set Nurse Communication: Mobility status;Other (comment)(left LE bleeding on bed and after walking, bed soaked) PT Visit Diagnosis: Unsteadiness on feet (R26.81);Other abnormalities of gait and mobility (R26.89);Muscle weakness (generalized) (M62.81);History of falling (Z91.81);Pain Pain - Right/Left: Right Pain - part of body: (toe)    Time: 2595-6387 PT Time  Calculation (min) (ACUTE ONLY): 22 min   Charges:   PT Evaluation $PT Eval Low Complexity: 1 Low PT Treatments $Gait Training: 8-22 mins        Rodric Punch, PT, GCS 06/24/18,10:03 AM

## 2018-06-25 ENCOUNTER — Ambulatory Visit: Payer: Medicare Other | Admitting: Physician Assistant

## 2018-06-25 LAB — GLUCOSE, CAPILLARY
Glucose-Capillary: 140 mg/dL — ABNORMAL HIGH (ref 70–99)
Glucose-Capillary: 156 mg/dL — ABNORMAL HIGH (ref 70–99)

## 2018-06-25 LAB — BASIC METABOLIC PANEL
Anion gap: 6 (ref 5–15)
BUN: 30 mg/dL — ABNORMAL HIGH (ref 8–23)
CO2: 30 mmol/L (ref 22–32)
Calcium: 8.3 mg/dL — ABNORMAL LOW (ref 8.9–10.3)
Chloride: 102 mmol/L (ref 98–111)
Creatinine, Ser: 1.73 mg/dL — ABNORMAL HIGH (ref 0.61–1.24)
GFR calc Af Amer: 44 mL/min — ABNORMAL LOW (ref >60)
GFR calc non Af Amer: 38 mL/min — ABNORMAL LOW (ref >60)
Glucose, Bld: 130 mg/dL — ABNORMAL HIGH (ref 70–99)
Potassium: 3.4 mmol/L — ABNORMAL LOW (ref 3.5–5.1)
Sodium: 138 mmol/L (ref 135–145)

## 2018-06-25 LAB — CBC
HCT: 36 % — ABNORMAL LOW (ref 39.0–52.0)
HEMOGLOBIN: 12 g/dL — AB (ref 13.0–17.0)
MCH: 32.6 pg (ref 26.0–34.0)
MCHC: 33.3 g/dL (ref 30.0–36.0)
MCV: 97.8 fL (ref 80.0–100.0)
Platelets: 158 10*3/uL (ref 150–400)
RBC: 3.68 MIL/uL — AB (ref 4.22–5.81)
RDW: 12.6 % (ref 11.5–15.5)
WBC: 5.8 10*3/uL (ref 4.0–10.5)
nRBC: 0 % (ref 0.0–0.2)

## 2018-06-25 LAB — SURGICAL PATHOLOGY

## 2018-06-25 MED ORDER — ENSURE MAX PROTEIN PO LIQD: 11.0000 [oz_av] | Freq: Two times a day (BID) | ORAL | 0 refills | Status: DC

## 2018-06-25 MED ORDER — LORAZEPAM 2 MG/ML IJ SOLN
0.5000 mg | Freq: Once | INTRAMUSCULAR | Status: AC
  Administered 2018-06-25: 13:00:00 0.5 mg via INTRAVENOUS
  Filled 2018-06-25: qty 1

## 2018-06-25 MED ORDER — SODIUM CHLORIDE 0.9% FLUSH
10.0000 mL | Freq: Two times a day (BID) | INTRAVENOUS | Status: DC
  Administered 2018-06-25: 14:00:00 10 mL

## 2018-06-25 MED ORDER — CEFTRIAXONE IV (FOR PTA / DISCHARGE USE ONLY): 2.0000 g | INTRAVENOUS | 0 refills | Status: AC

## 2018-06-25 MED ORDER — HYDROCODONE-ACETAMINOPHEN 5-325 MG PO TABS: 1.0000 | ORAL_TABLET | Freq: Four times a day (QID) | ORAL | 0 refills | Status: DC | PRN

## 2018-06-25 MED ORDER — ZOLPIDEM TARTRATE 5 MG PO TABS: 5.0000 mg | ORAL_TABLET | Freq: Every evening | ORAL | 0 refills | Status: DC | PRN

## 2018-06-25 MED ORDER — SODIUM CHLORIDE 0.9% FLUSH: 10.0000 mL | INTRAVENOUS | Status: DC | PRN

## 2018-06-25 NOTE — Progress Notes (Signed)
PT Cancellation Note  Patient Details Name: Adam Merritt MRN: [id number] DOB: Oct 17, 1945   Cancelled Treatment:    Reason Eval/Treat Not Completed: Other (comment)  Chart reviewed.  Discharge orders noted.  Pt in chair, reporting he feels more comfortable with mobility and has no concerns at this time.  Has walker and wheelchair at home if needed.    Chesley Noon 06/25/2018, 10:46 AM

## 2018-06-25 NOTE — Progress Notes (Signed)
Peripherally Inserted Central Catheter/Midline Placement  The IV Nurse has discussed with the patient and/or persons authorized to consent for the patient, the purpose of this procedure and the potential benefits and risks involved with this procedure.  The benefits include less needle sticks, lab draws from the catheter, and the patient may be discharged home with the catheter. Risks include, but not limited to, infection, bleeding, blood clot (thrombus formation), and puncture of an artery; nerve damage and irregular heartbeat and possibility to perform a PICC exchange if needed/ordered by physician.  Alternatives to this procedure were also discussed.  Bard Power PICC patient education guide, fact sheet on infection prevention and patient information card has been provided to patient /or left at bedside.    PICC/Midline Placement Documentation  PICC Single Lumen 06/25/18 PICC Right Cephalic 42 cm 0 cm (Active)  Indication for Insertion or Continuance of Line Home intravenous therapies (PICC only) 06/25/2018  1:40 PM  Exposed Catheter (cm) 0 cm 06/25/2018  1:40 PM  Site Assessment Clean;Dry;Intact 06/25/2018  1:40 PM  Line Status Flushed;Saline locked;Blood return noted 06/25/2018  1:40 PM  Dressing Type Transparent 06/25/2018  1:40 PM  Dressing Status Clean;Dry;Antimicrobial disc in place;Intact 06/25/2018  1:40 PM  Dressing Change Due 07/02/18 06/25/2018  1:40 PM       Gordan Payment 06/25/2018, 1:41 PM

## 2018-06-25 NOTE — Discharge Instructions (Signed)
Your ambien dose was changed to 5 mg po daily.  Please stop taking the 12.5 dose.      [facility] Stay Proper nutrition can help your body recover from illness and injury.   Foods and beverages high in protein, vitamins, and minerals help rebuild muscle loss, promote healing, & reduce fall risk.   In addition to eating healthy foods, a nutrition shake is an easy, delicious way to get the nutrition you need during and after your hospital stay  It is recommended that you continue to drink 1 bottle per day of:      Ensure Max Protein for at least 1 month (30 days) after your hospital stay   Tips for adding a nutrition shake into your routine: As allowed, drink one with vitamins or medications instead of water or juice Enjoy one as a tasty mid-morning or afternoon snack Drink cold or make a milkshake out of it Drink one instead of milk with cereal or snacks Use as a coffee creamer   Available at the following grocery stores and pharmacies:           * [facility] [phone]            For COUPONS visit: www.ensure.com/join or [email or web address]://dawson-may.[email or web address]/   Suggested Substitutions Ensure Plus = Boost Plus = Carnation Breakfast Essentials = Boost Compact Ensure Active Clear = Boost Breeze Glucerna Shake = Boost Glucose Control = Carnation Breakfast Essentials SUGAR FREE Ensure Max Protein = Premier Protein

## 2018-06-25 NOTE — Consult Note (Addendum)
[name] Nurse wound consult note  Podiatry team performed surgery to right foot; please refer to their team for further questions regarding plan of care to this location.  Reason for Consult: Consult requested for bilat legs.  Pt states he has been followed prior to admission by the outpatient wound care center and "they applied some white cream and wraps. Generalized edema and dry flaking skin to BLE. Wound type: Left anterior leg with patchy areas of partial thickness stasis ulcers; approx 25X3X.1cm, dry, dark red scabbed; no odor, drainage, or fluctuance.   Right anterior leg with patchy area of dry red partial thickness wounds; approx 1X.5X.1cm, no odor, drainage, or fluctuance  Dressing procedure/placement/frequency:  Foam dressing to protect right leg from further injury and promote healing.  Xerform gauze to left leg to assist with removal of dry scabbed areas. Pt states he will resume follow-up with the outpatient wound care center after discharge. Please re-consult if further assistance is needed.  Thank-you,  Julien Girt MSN, [place], [place], [place], [place]

## 2018-06-25 NOTE — Anesthesia Postprocedure Evaluation (Signed)
Anesthesia Post Note  Patient: Adam Merritt  Procedure(s) Performed: AMPUTATION TOE (Right )  Patient location during evaluation: PACU Anesthesia Type: MAC Level of consciousness: awake and alert Pain management: pain level controlled Vital Signs Assessment: post-procedure vital signs reviewed and stable Respiratory status: spontaneous breathing, nonlabored ventilation, respiratory function stable and patient connected to nasal cannula oxygen Cardiovascular status: blood pressure returned to baseline and stable Postop Assessment: no apparent nausea or vomiting Anesthetic complications: no     Last Vitals:  Vitals:   06/25/18 0426 06/25/18 0851  BP: (!) 151/86 140/68  Pulse: 66 67  Resp: (!) 21 20  Temp: 36.4 C 36.4 C  SpO2: 99% 98%    Last Pain:  Vitals:   06/25/18 0915  TempSrc:   PainSc: [name]

## 2018-06-25 NOTE — Progress Notes (Signed)
Discharge instructions given and went over with patient and patients wife at bedside. All questions answered. Patient to discharge home with [facility]. Primary RN made aware. Madlyn Frankel, RN

## 2018-06-25 NOTE — Discharge Summary (Signed)
[facility] [place] NAME: Adam Merritt    MR#:  [id number]  DATE OF BIRTH:  05/10/1946  DATE OF ADMISSION:  06/21/2018 ADMITTING PHYSICIAN: Demetrios Loll, MD  DATE OF DISCHARGE: 06/25/2018  PRIMARY CARE PHYSICIAN: [name], MD    ADMISSION DIAGNOSIS:  Cellulitis of left lower extremity [L03.116] Generalized weakness [R53.1] Sepsis without acute organ dysfunction, due to unspecified organism [facility]) [A41.9]  DISCHARGE DIAGNOSIS:  Active Problems:   Sepsis ([place])   SECONDARY DIAGNOSIS:   Past Medical History:  Diagnosis Date  . Anemia   . CAD (coronary artery disease)   . Chest pain   . Coronary artery disease   . Diabetes mellitus without complication ([place])   . Diastolic CHF ([place])   . Esophageal reflux   . Herpes zoster without mention of complication   . Hyperlipidemia   . Hypertension   . Insomnia   . Lumbago   . Lymphedema   . Neuropathy in diabetes ([place])   . Obstructive chronic bronchitis without exacerbation ([place])   . Other malaise and fatigue   . Stroke ([place])   . Varicose veins     HOSPITAL COURSE:   1.  Clinical sepsis osteomyelitis of the toe.  Patient status post amputation of the right first toe.  Patient has group B strep growing in the blood.  Infectious disease recommended 2 weeks of IV antibiotics through 07/07/2018.  Rocephin prescribed and home health set up.  PICC line to be placed today.  Repeat blood cultures so far negative. 2.  Right great toe infection with osteomyelitis.  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 3.  Hypokalemia.  Supplemented 4.  Chronic kidney disease stage III. 5.  Morbid obesity with a BMI of 47.79 6.  Essential hypertension on Coreg and Lasix 7.  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  History of gout on Uloric 9.  GERD on Protonix 10.  Hyperlipidemia unspecified on atorvastatin 11.  Chronic venous stasis dermatitis and ulcerations.  Follows up at  the wound care clinic.  DISCHARGE CONDITIONS:   Satisfactory  CONSULTS OBTAINED:  Treatment Team:  Arnaldo Natal, MD Sharlotte Alamo, DPM Tsosie Billing, MD  DRUG ALLERGIES:  No Known Allergies  DISCHARGE MEDICATIONS:   Allergies as of 06/25/2018   No Known Allergies     Medication List    STOP taking these medications   hydrALAZINE 25 MG tablet Commonly known as:  APRESOLINE   ibuprofen 600 MG tablet Commonly known as:  ADVIL,MOTRIN   metolazone 2.5 MG tablet Commonly known as:  ZAROXOLYN   zolpidem 12.5 MG CR tablet Commonly known as:  AMBIEN CR Replaced by:  zolpidem 5 MG tablet     TAKE these medications   albuterol 108 (90 Base) MCG/ACT inhaler Commonly known as:  PROVENTIL HFA;VENTOLIN HFA INHALE 1 PUFF INTO THE LUNGS EVERY 6 HOURS AS NEEDED FOR WHEEZING OR SHORTNESS OF BREATH What changed:  See the new instructions.   aspirin 325 MG EC tablet Take 325 mg by mouth daily.   atorvastatin 40 MG tablet Commonly known as:  LIPITOR Take 40 mg by mouth at bedtime.   BASAGLAR KWIKPEN 100 UNIT/ML Sopn INJECT 30 TO 36 UNITS UNDER THE SKIN EVERY DAY What changed:    how much to take  how to take this  when to take this  additional instructions   carvedilol 25 MG tablet Commonly known as:  COREG Take 25 mg  by mouth 2 (two) times daily with a meal.   cefTRIAXone  IVPB Commonly known as:  ROCEPHIN Inject 2 g into the vein daily for 12 days. Indication:  Group B streptococcus bacteremia Last Day of Therapy:  07/07/2018 Labs - Once weekly on Mondays:  CBC/D and CMP, Please pull PIC at completion of IV antibiotics   docusate sodium 50 MG capsule Commonly known as:  COLACE Take 50 mg by mouth 2 (two) times daily.   ENSURE MAX PROTEIN Liqd Take 330 mLs (11 oz total) by mouth 2 (two) times daily.   febuxostat 40 MG tablet Commonly known as:  ULORIC Take 1 tablet (40 mg total) by mouth daily.   fentaNYL 50 MCG/HR Commonly known as:   DURAGESIC Place 1 patch (50 mcg total) onto the skin every 3 (three) days.   furosemide 40 MG tablet Commonly known as:  LASIX Take 1 tablet (40 mg total) by mouth daily.   gabapentin 300 MG capsule Commonly known as:  NEURONTIN Take 1 capsule po in AM and 1 capsule po in pm. Take 2 capsules po at bedtime.   HYDROcodone-acetaminophen 5-325 MG tablet Commonly known as:  NORCO/VICODIN Take 1 tablet by mouth every 6 (six) hours as needed for moderate pain or severe pain.   indapamide 2.5 MG tablet Commonly known as:  LOZOL Take 1 tablet (2.5 mg total) by mouth daily.   ipratropium-albuterol 0.5-2.5 (3) MG/3ML Soln Commonly known as:  DUONEB Take 3 mLs by nebulization every 4 (four) hours as needed (for shortness of breath).   liraglutide 18 MG/3ML Sopn Commonly known as:  VICTOZA Inject 0.3 mLs (1.8 mg total) into the skin daily.   multivitamin with minerals Tabs tablet Take 1 tablet by mouth daily.   omeprazole 20 MG capsule Commonly known as:  PRILOSEC Take 20 mg by mouth daily.   pentoxifylline 400 MG CR tablet Commonly known as:  TRENTAL TAKE 1 TABLET BY MOUTH THREE TIMES DAILY FOR CLAUDICATION What changed:  See the new instructions.   potassium chloride SA 20 MEQ tablet Commonly known as:  K-DUR,KLOR-CON Take 2 tablets (40 mEq total) by mouth 2 (two) times daily.   sennosides-docusate sodium 8.6-50 MG tablet Commonly known as:  SENOKOT-S Take 1-2 tablets by mouth 2 (two) times daily.   zolpidem 5 MG tablet Commonly known as:  AMBIEN Take 1 tablet (5 mg total) by mouth at bedtime as needed for sleep. Replaces:  zolpidem 12.5 MG CR tablet            Home Infusion Instuctions  (From admission, onward)         Start     Ordered   06/25/18 0000  Home infusion instructions [facility] May follow [place] Dosing Protocol; May administer Cathflo as needed to maintain patency of vascular access device.; [place]ushing of vascular access device: per [facility]  Protocol: 0.9% NaCl pre/post medica...    Question Answer Comment  Instructions May follow [place] Dosing Protocol   Instructions May administer Cathflo as needed to maintain patency of vascular access device.   Instructions Flushing of vascular access device: per [facility] Protocol: 0.9% NaCl pre/post medication administration and prn patency; Heparin 100 u/ml, 61ml for implanted ports and Heparin 10u/ml, 26ml for all other central venous catheters.   Instructions May follow [facility] Anaphylaxis Protocol for First Dose Administration in the home: 0.9% NaCl at 25-50 ml/hr to maintain IV access for protocol meds. Epinephrine 0.3 ml IV/IM PRN and Benadryl 25-50 IV/IM PRN  s/s of anaphylaxis.   Instructions [facility] Infusion Coordinator (RN) to assist per patient IV care needs in the home PRN.      06/25/18 0926           DISCHARGE INSTRUCTIONS:   Follow-up with podiatry 1 week Follow-up infectious disease 1 week Follow-up PMD 5 days  If you experience worsening of your admission symptoms, develop shortness of breath, life threatening emergency, suicidal or homicidal thoughts you must seek medical attention immediately by calling 911 or calling your MD immediately  if symptoms less severe.  You Must read complete instructions/literature along with all the possible adverse reactions/side effects for all the Medicines you take and that have been prescribed to you. Take any new Medicines after you have completely understood and accept all the possible adverse reactions/side effects.   Please note  You were cared for by a hospitalist during your hospital stay. If you have any questions about your discharge medications or the care you received while you were in the hospital after you are discharged, you can call the unit and asked to speak with the hospitalist on call if the hospitalist that took care of you is not available. Once you are discharged, your primary care physician will handle any  further medical issues. Please note that NO REFILLS for any discharge medications will be authorized once you are discharged, as it is imperative that you return to your primary care physician (or establish a relationship with a primary care physician if you do not have one) for your aftercare needs so that they can reassess your need for medications and monitor your lab values.    Today   CHIEF COMPLAINT:   Chief Complaint  Patient presents with  . Weakness  . Leg Pain    HISTORY OF PRESENT ILLNESS:  Adam Merritt  is a 73 y.o. male came in with leg pain and weakness   VITAL SIGNS:  Blood pressure 140/68, pulse 67, temperature 97.6 F (36.4 C), temperature source Oral, resp. rate 20, height 5\' 6"  (1.676 m), weight 134.3 kg, SpO2 98 %.    PHYSICAL EXAMINATION:  GENERAL:  73 y.o.-year-old patient lying in the bed with no acute distress.  EYES: Pupils equal, round, reactive to light and accommodation. No scleral icterus. Extraocular muscles intact.  HEENT: Head atraumatic, normocephalic. Oropharynx and nasopharynx clear.  NECK:  Supple, no jugular venous distention. No thyroid enlargement, no tenderness.  LUNGS: Normal breath sounds bilaterally, no wheezing, rales,rhonchi or crepitation. No use of accessory muscles of respiration.  CARDIOVASCULAR: S1, S2 normal. No murmurs, rubs, or gallops.  ABDOMEN: Soft, non-tender, non-distended. Bowel sounds present. No organomegaly or mass.  EXTREMITIES: 2+ pedal edema.no cyanosis, or clubbing.  NEUROLOGIC: Cranial nerves II through XII are intact. Muscle strength 5/5 in all extremities. Sensation intact. Gait not checked.  PSYCHIATRIC: The patient is alert and oriented x 3.  SKIN: Right first toe amputated and covered.  Small skin ulceration bilateral lower extremities. chronic lower extremity discoloration bilateral lower extremities  DATA REVIEW:   CBC Recent Labs  Lab 06/25/18 0029  WBC 5.8  HGB 12.0*  HCT 36.0*  PLT 158     Chemistries  Recent Labs  Lab 06/21/18 1624  06/22/18 0553  06/25/18 0029  NA 134*  --  135   < > 138  K 3.4*  --  2.5*   < > 3.4*  CL 95*  --  98   < > 102  CO2 28  --  26   < > 30  GLUCOSE 176*  --  169*   < > 130*  BUN 29*  --  28*   < > 30*  CREATININE 2.19*   < > 2.22*   < > 1.73*  CALCIUM 8.6*  --  8.0*   < > 8.3*  MG  --   --  1.9  --   --   AST 33  --   --   --   --   ALT 20  --   --   --   --   ALKPHOS 71  --   --   --   --   BILITOT 1.3*  --   --   --   --    < > = values in this interval not displayed.     Microbiology Results  Results for orders placed or performed during the hospital encounter of 06/21/18  Blood Culture (routine x 2)     Status: Abnormal   Collection Time: 06/21/18  7:14 PM  Result Value Ref Range Status   Specimen Description   Final    BLOOD LEFT ARM Performed at [facility], [address] [place], [place] [address]    Special Requests   Final    BOTTLES DRAWN AEROBIC AND ANAEROBIC Blood Culture results may not be optimal due to an excessive volume of blood received in culture bottles Performed at [facility], [address] [place], [place] [address]    Culture  Setup Time   Final    GRAM POSITIVE COCCI IN BOTH AEROBIC AND ANAEROBIC BOTTLES CRITICAL RESULT CALLED TO, READ BACK BY AND VERIFIED WITH: DAVID BESANTI ON 06/22/2018 AT 8416 QSD Performed at [facility], [place] [address], [place], [place] [address]    Culture GROUP B STREP(S.AGALACTIAE)ISOLATED (A)  Final   Report Status 06/24/2018 FINAL  Final   Organism ID, Bacteria GROUP B STREP(S.AGALACTIAE)ISOLATED  Final      Susceptibility   Group b strep(s.agalactiae)isolated - MIC*    CLINDAMYCIN >=1 RESISTANT Resistant     AMPICILLIN <=0.25 SENSITIVE Sensitive     ERYTHROMYCIN >=8 RESISTANT Resistant     VANCOMYCIN 0.5 SENSITIVE Sensitive     CEFTRIAXONE <=0.12 SENSITIVE Sensitive     LEVOFLOXACIN 1 SENSITIVE Sensitive     * GROUP B  STREP(S.AGALACTIAE)ISOLATED  Blood Culture (routine x 2)     Status: Abnormal   Collection Time: 06/21/18  7:14 PM  Result Value Ref Range Status   Specimen Description   Final    BLOOD RIGHT WRIST Performed at [facility], [address], [place], [place] [address]    Special Requests   Final    BOTTLES DRAWN AEROBIC AND ANAEROBIC Blood Culture adequate volume Performed at [facility], [address] [place], [place] [address]    Culture  Setup Time   Final    GRAM POSITIVE COCCI IN BOTH AEROBIC AND ANAEROBIC BOTTLES CRITICAL RESULT CALLED TO, READ BACK BY AND VERIFIED WITH: DAVID BESANTI AT 5732 06/22/2018. QSD/PMF Performed at [facility], [address] [place], [place] [address]    Culture (A)  Final    GROUP B STREP(S.AGALACTIAE)ISOLATED SUSCEPTIBILITIES PERFORMED ON PREVIOUS CULTURE WITHIN THE LAST 5 DAYS. Performed at [facility], [place] [address], [place],  [address]    Report Status 06/24/2018 FINAL  Final  Blood Culture ID Panel (Reflexed)     Status: Abnormal   Collection Time:  06/21/18  7:14 PM  Result Value Ref Range Status   Enterococcus species NOT DETECTED NOT DETECTED Final   Listeria monocytogenes NOT DETECTED NOT DETECTED Final   Staphylococcus species NOT DETECTED NOT DETECTED Final   Staphylococcus aureus (BCID) NOT DETECTED NOT DETECTED Final   Streptococcus species DETECTED (A) NOT DETECTED Final    Comment: CRITICAL RESULT CALLED TO, READ BACK BY AND VERIFIED WITH: DAVID BESANTI ON 06/22/2018 AT 0523 QSD    Streptococcus agalactiae DETECTED (A) NOT DETECTED Final    Comment: CRITICAL RESULT CALLED TO, READ BACK BY AND VERIFIED WITH: DAVID BESANTI ON 06/22/2018 AT 0523 QSD    Streptococcus pneumoniae NOT DETECTED NOT DETECTED Final   Streptococcus pyogenes NOT DETECTED NOT DETECTED Final   Acinetobacter baumannii NOT DETECTED NOT DETECTED Final   Enterobacteriaceae species NOT DETECTED NOT DETECTED Final    Enterobacter cloacae complex NOT DETECTED NOT DETECTED Final   Escherichia coli NOT DETECTED NOT DETECTED Final   Klebsiella oxytoca NOT DETECTED NOT DETECTED Final   Klebsiella pneumoniae NOT DETECTED NOT DETECTED Final   Proteus species NOT DETECTED NOT DETECTED Final   Serratia marcescens NOT DETECTED NOT DETECTED Final   Haemophilus influenzae NOT DETECTED NOT DETECTED Final   Neisseria meningitidis NOT DETECTED NOT DETECTED Final   Pseudomonas aeruginosa NOT DETECTED NOT DETECTED Final   Candida albicans NOT DETECTED NOT DETECTED Final   Candida glabrata NOT DETECTED NOT DETECTED Final   Candida krusei NOT DETECTED NOT DETECTED Final   Candida parapsilosis NOT DETECTED NOT DETECTED Final   Candida tropicalis NOT DETECTED NOT DETECTED Final    Comment: Performed at [facility], [address] [place], [place] [address]  Aerobic/Anaerobic Culture (surgical/deep wound)     Status: None (Preliminary result)   Collection Time: 06/23/18 12:34 PM  Result Value Ref Range Status   Specimen Description   Final    TISSUE Performed at [facility], [address], [place], [place] [address]    Special Requests   Final    NONE Performed at [facility], [address] [place], [place] [address]    Gram Stain   Final    NO WBC SEEN NO ORGANISMS SEEN Performed at [facility], [place] [address] [place],  [address]    Culture   Final    CULTURE REINCUBATED FOR BETTER GROWTH NO ANAEROBES ISOLATED; CULTURE IN PROGRESS FOR 5 DAYS    Report Status PENDING  Incomplete  CULTURE, BLOOD (ROUTINE X 2) w Reflex to ID Panel     Status: None (Preliminary result)   Collection Time: 06/25/18 12:28 AM  Result Value Ref Range Status   Specimen Description BLOOD BLOOD LEFT HAND  Final   Special Requests   Final    BOTTLES DRAWN AEROBIC AND ANAEROBIC Blood Culture adequate volume   Culture   Final    NO GROWTH < 12 HOURS Performed at [facility],  [address] [place],  [address]    Report Status PENDING  Incomplete  CULTURE, BLOOD (ROUTINE X 2) w Reflex to ID Panel     Status: None (Preliminary result)   Collection Time: 06/25/18 12:30 AM  Result Value Ref Range Status   Specimen Description BLOOD BLOOD RIGHT HAND  Final   Special Requests   Final    BOTTLES DRAWN AEROBIC AND ANAEROBIC Blood Culture adequate volume   Culture   Final    NO GROWTH < 12 HOURS Performed at [facility],  [place], [place] [address]    Report Status PENDING  Incomplete    RADIOLOGY:  [place] Ekg Site Rite  Result Date: 06/24/2018 If Site Rite image not attached, placement could not be confirmed due to current cardiac rhythm.     Management plans discussed with the patient, family and they are in agreement.  CODE STATUS:     Code Status Orders  (From admission, onward)         Start     Ordered   06/21/18 2126  Full code  Continuous     06/21/18 2125        Code Status History    Date Active Date Inactive Code Status Order ID Comments User Context   04/16/2018 1758 04/18/2018 2051 Full Code [id number]  Gladstone Lighter, MD ED   08/29/2017 1210 08/31/2017 1755 Full Code [id number]  Gorden Harms, MD Inpatient   09/15/2016 2233 09/16/2016 1942 Full Code [id number]  [place], [place], DO Inpatient   02/12/2015 2156 02/13/2015 1659 Full Code [id number]  Henreitta Leber, MD Inpatient      TOTAL TIME TAKING CARE OF THIS PATIENT: 35 minutes.    Loletha Grayer M.D on 06/25/2018 at 2:39 PM  Between 7am to 6pm - Pager - [phone]  After 6pm go to [email or web address] - password [email or web address] [facility]  [phone]  CC: Primary care physician; [name], MD

## 2018-06-25 NOTE — Care Management (Signed)
Discussed that Mr. Corrales will need Rocephin 2 grams  IV daily x 12 days until 07/07/18. Will order PICC line.  Spoke with Mr. Credit and wife at the bedside. Would like [facility] for IV infusions. Floydene Flock , [facility] representative updated. Would like [facility] for [place]. Spoke with [place] at [facility]. Will let me know if [facility] can take Mr. Lovena Le. Shelbie Ammons RN MSN CCM Care Management [phone]

## 2018-06-25 NOTE — Care Management (Addendum)
Received telephone call back from [facility] representative, [place]. Can't accept Mr. Spurr at this time. Discussed agencies with Mr. Grill at the bedside. [place]. Floydene Flock, [facility] representative updated.  Cassie, [facility] representative updated.  Shelbie Ammons RN MSN CCM Care Management [phone]

## 2018-06-27 ENCOUNTER — Telehealth: Payer: Self-pay

## 2018-06-27 NOTE — Telephone Encounter (Signed)
Gave verbal order to [facility] nurse [phone] for home health nursing and 2 times a week for 5 weeks

## 2018-06-28 LAB — AEROBIC/ANAEROBIC CULTURE W GRAM STAIN (SURGICAL/DEEP WOUND): Gram Stain: NONE SEEN

## 2018-06-28 LAB — AEROBIC/ANAEROBIC CULTURE (SURGICAL/DEEP WOUND)

## 2018-06-30 LAB — CULTURE, BLOOD (ROUTINE X 2)
Culture: NO GROWTH
Culture: NO GROWTH
Special Requests: ADEQUATE
Special Requests: ADEQUATE

## 2018-07-01 ENCOUNTER — Ambulatory Visit (INDEPENDENT_AMBULATORY_CARE_PROVIDER_SITE_OTHER): Payer: Medicare Other | Admitting: Nurse Practitioner

## 2018-07-01 ENCOUNTER — Encounter: Payer: Self-pay | Admitting: Nurse Practitioner

## 2018-07-01 VITALS — BP 144/79 | HR 82 | Resp 16 | Ht 66.0 in | Wt 297.0 lb

## 2018-07-01 DIAGNOSIS — G894 Chronic pain syndrome: Secondary | ICD-10-CM

## 2018-07-01 DIAGNOSIS — I10 Essential (primary) hypertension: Secondary | ICD-10-CM | POA: Diagnosis not present

## 2018-07-01 DIAGNOSIS — E1142 Type 2 diabetes mellitus with diabetic polyneuropathy: Secondary | ICD-10-CM | POA: Diagnosis not present

## 2018-07-01 DIAGNOSIS — I739 Peripheral vascular disease, unspecified: Secondary | ICD-10-CM | POA: Diagnosis not present

## 2018-07-01 DIAGNOSIS — G4733 Obstructive sleep apnea (adult) (pediatric): Secondary | ICD-10-CM

## 2018-07-01 MED ORDER — FENTANYL 50 MCG/HR TD PT72: 1.0000 | MEDICATED_PATCH | TRANSDERMAL | 0 refills | Status: DC

## 2018-07-01 NOTE — Progress Notes (Deleted)
[facility] [place], [place] [address]  Internal MEDICINE  Office Visit Note  Patient Name: Adam Merritt  [id number]  [id number]  Date of Service: 07/01/2018   Complaints/HPI Pt is here for establishment of PCP. Chief Complaint  Patient presents with  . Diabetes  . Hypertension  . Hyperlipidemia  . Hospitalization Follow-up   HPI  Current Medication: Outpatient Encounter Medications as of 07/01/2018  Medication Sig  . albuterol (PROVENTIL HFA;VENTOLIN HFA) 108 (90 Base) MCG/ACT inhaler INHALE 1 PUFF INTO THE LUNGS EVERY 6 HOURS AS NEEDED FOR WHEEZING OR SHORTNESS OF BREATH (Patient taking differently: Inhale 1 puff into the lungs every 6 (six) hours as needed for wheezing or shortness of breath. )  . aspirin 325 MG EC tablet Take 325 mg by mouth daily.  Marland Kitchen atorvastatin (LIPITOR) 40 MG tablet Take 40 mg by mouth at bedtime.   . carvedilol (COREG) 25 MG tablet Take 25 mg by mouth 2 (two) times daily with a meal.  . cefTRIAXone (ROCEPHIN) IVPB Inject 2 g into the vein daily for 12 days. Indication:  Group B streptococcus bacteremia Last Day of Therapy:  07/07/2018 Labs - Once weekly on Mondays:  CBC/D and CMP, Please pull PIC at completion of IV antibiotics  . docusate sodium (COLACE) 50 MG capsule Take 50 mg by mouth 2 (two) times daily.  Marland Kitchen ENSURE MAX PROTEIN (ENSURE MAX PROTEIN) LIQD Take 330 mLs (11 oz total) by mouth 2 (two) times daily.  . febuxostat (ULORIC) 40 MG tablet Take 1 tablet (40 mg total) by mouth daily.  . fentaNYL (DURAGESIC) 50 MCG/HR Place 1 patch onto the skin every 3 (three) days.  . furosemide (LASIX) 40 MG tablet Take 1 tablet (40 mg total) by mouth daily.  Marland Kitchen gabapentin (NEURONTIN) 300 MG capsule Take 1 capsule po in AM and 1 capsule po in pm. Take 2 capsules po at bedtime.  Marland Kitchen HYDROcodone-acetaminophen (NORCO/VICODIN) 5-325 MG tablet Take 1 tablet by mouth every 6 (six) hours as needed for moderate pain or severe pain.  . indapamide  (LOZOL) 2.5 MG tablet Take 1 tablet (2.5 mg total) by mouth daily.  . Insulin Glargine (BASAGLAR KWIKPEN) 100 UNIT/ML SOPN INJECT 30 TO 36 UNITS UNDER THE SKIN EVERY DAY (Patient taking differently: Inject 30-36 Units into the skin daily. )  . ipratropium-albuterol (DUONEB) 0.5-2.5 (3) MG/3ML SOLN Take 3 mLs by nebulization every 4 (four) hours as needed (for shortness of breath).  . liraglutide (VICTOZA) 18 MG/3ML SOPN Inject 0.3 mLs (1.8 mg total) into the skin daily.  . Multiple Vitamin (MULTIVITAMIN WITH MINERALS) TABS tablet Take 1 tablet by mouth daily.  Marland Kitchen omeprazole (PRILOSEC) 20 MG capsule Take 20 mg by mouth daily.  . pentoxifylline (TRENTAL) 400 MG CR tablet TAKE 1 TABLET BY MOUTH THREE TIMES DAILY FOR CLAUDICATION (Patient taking differently: Take 400 mg by mouth 3 (three) times daily. )  . potassium chloride SA (K-DUR,KLOR-CON) 20 MEQ tablet Take 2 tablets (40 mEq total) by mouth 2 (two) times daily.  . sennosides-docusate sodium (SENOKOT-S) 8.6-50 MG tablet Take 1-2 tablets by mouth 2 (two) times daily.  Marland Kitchen zolpidem (AMBIEN) 5 MG tablet Take 1 tablet (5 mg total) by mouth at bedtime as needed for sleep.  . [DISCONTINUED] fentaNYL (DURAGESIC - DOSED MCG/HR) 50 MCG/HR Place 1 patch (50 mcg total) onto the skin every 3 (three) days.   No facility-administered encounter medications on file as of 07/01/2018.     Surgical History: Past Surgical  History:  Procedure Laterality Date  . 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 CHOLECYSTECTOMY    . CORONARY ARTERY BYPASS GRAFT      Medical History: Past Medical History:  Diagnosis Date  . Anemia   . CAD (coronary artery disease)   . Chest pain   . Coronary artery disease   . Diabetes mellitus without complication ([place])   . Diastolic CHF ([place])   . Esophageal reflux   . Herpes zoster without mention of complication   .  Hyperlipidemia   . Hypertension   . Insomnia   . Lumbago   . Lymphedema   . Neuropathy in diabetes ([place])   . Obstructive chronic bronchitis without exacerbation ([place])   . Other malaise and fatigue   . Stroke ([place])   . Varicose veins     Family History: Family History  Problem Relation Age of Onset  . Diabetes Mother   . Hyperlipidemia Mother   . Hypertension Mother   . Diabetes Father   . Hyperlipidemia Father   . Hypertension Father   . CAD Father     Social History   Socioeconomic History  . Marital status: Married    Spouse name: Not on file  . Number of children: Not on file  . Years of education: Not on file  . Highest education level: Not on file  Occupational History  . Not on file  Social Needs  . Financial resource strain: Not on file  . Food insecurity:    Worry: Not on file    Inability: Not on file  . Transportation needs:    Medical: Not on file    Non-medical: Not on file  Tobacco Use  . Smoking status: Never Smoker  . Smokeless tobacco: Never Used  Substance and Sexual Activity  . Alcohol use: No    Comment: quit alcohol 30 years ago  . Drug use: No  . Sexual activity: Not on file  Lifestyle  . Physical activity:    Days per week: Not on file    Minutes per session: Not on file  . Stress: Not on file  Relationships  . Social connections:    Talks on phone: Not on file    Gets together: Not on file    Attends religious service: Not on file    Active member of club or organization: Not on file    Attends meetings of clubs or organizations: Not on file    Relationship status: Not on file  . Intimate partner violence:    Fear of current or ex partner: Not on file    Emotionally abused: Not on file    Physically abused: Not on file    Forced sexual activity: Not on file  Other Topics Concern  . Not on file  Social History Narrative   Lives at home with wife, uses a wheelchair now     Review of Systems  Today's Vitals   07/01/18  1449  BP: (!) 144/79  Pulse: 82  Resp: 16  SpO2: 97%  Weight: 297 lb (134.7 kg)  Height: 5\' 6"  (1.676 m)   Body mass index is 47.94 kg/m.  Physical Exam    Assessment/Plan:   General Counseling: Montrae verbalizes understanding of the findings of todays visit and agrees with plan of treatment. I have discussed any further diagnostic evaluation that may be needed or  ordered today. We also reviewed his medications today. he has been encouraged to call the office with any questions or concerns that should arise related to todays visit.    Counseling:    No orders of the defined types were placed in this encounter.   Meds ordered this encounter  Medications  . fentaNYL (DURAGESIC) 50 MCG/HR    Sig: Place 1 patch onto the skin every 3 (three) days.    Dispense:  10 patch    Refill:  0    Order Specific Question:   Supervising Provider    Answer:   [name] [[id number]]    Time spent:*** Minutes

## 2018-07-02 ENCOUNTER — Ambulatory Visit: Payer: Medicare Other | Attending: Infectious Diseases | Admitting: Infectious Diseases

## 2018-07-02 ENCOUNTER — Encounter: Payer: Self-pay | Admitting: Infectious Diseases

## 2018-07-02 ENCOUNTER — Encounter: Payer: Medicare Other | Admitting: Physician Assistant

## 2018-07-02 VITALS — BP 119/74 | HR 73 | Temp 97.6°F | Ht 66.0 in | Wt 297.0 lb

## 2018-07-02 DIAGNOSIS — B951 Streptococcus, group B, as the cause of diseases classified elsewhere: Secondary | ICD-10-CM | POA: Insufficient documentation

## 2018-07-02 DIAGNOSIS — Z95828 Presence of other vascular implants and grafts: Secondary | ICD-10-CM

## 2018-07-02 DIAGNOSIS — Z8673 Personal history of transient ischemic attack (TIA), and cerebral infarction without residual deficits: Secondary | ICD-10-CM | POA: Diagnosis not present

## 2018-07-02 DIAGNOSIS — I2510 Atherosclerotic heart disease of native coronary artery without angina pectoris: Secondary | ICD-10-CM | POA: Diagnosis not present

## 2018-07-02 DIAGNOSIS — Z89411 Acquired absence of right great toe: Secondary | ICD-10-CM

## 2018-07-02 DIAGNOSIS — E669 Obesity, unspecified: Secondary | ICD-10-CM | POA: Diagnosis not present

## 2018-07-02 DIAGNOSIS — I890 Lymphedema, not elsewhere classified: Secondary | ICD-10-CM | POA: Diagnosis not present

## 2018-07-02 DIAGNOSIS — M869 Osteomyelitis, unspecified: Secondary | ICD-10-CM | POA: Diagnosis not present

## 2018-07-02 DIAGNOSIS — L089 Local infection of the skin and subcutaneous tissue, unspecified: Secondary | ICD-10-CM

## 2018-07-02 DIAGNOSIS — I509 Heart failure, unspecified: Secondary | ICD-10-CM

## 2018-07-02 DIAGNOSIS — Z9221 Personal history of antineoplastic chemotherapy: Secondary | ICD-10-CM | POA: Diagnosis not present

## 2018-07-02 DIAGNOSIS — E1169 Type 2 diabetes mellitus with other specified complication: Secondary | ICD-10-CM

## 2018-07-02 DIAGNOSIS — J449 Chronic obstructive pulmonary disease, unspecified: Secondary | ICD-10-CM | POA: Diagnosis not present

## 2018-07-02 DIAGNOSIS — L98492 Non-pressure chronic ulcer of skin of other sites with fat layer exposed: Secondary | ICD-10-CM | POA: Diagnosis not present

## 2018-07-02 DIAGNOSIS — Z951 Presence of aortocoronary bypass graft: Secondary | ICD-10-CM

## 2018-07-02 DIAGNOSIS — E1140 Type 2 diabetes mellitus with diabetic neuropathy, unspecified: Secondary | ICD-10-CM | POA: Diagnosis not present

## 2018-07-02 DIAGNOSIS — E785 Hyperlipidemia, unspecified: Secondary | ICD-10-CM

## 2018-07-02 DIAGNOSIS — G4730 Sleep apnea, unspecified: Secondary | ICD-10-CM | POA: Diagnosis not present

## 2018-07-02 DIAGNOSIS — R7881 Bacteremia: Secondary | ICD-10-CM | POA: Diagnosis not present

## 2018-07-02 DIAGNOSIS — Z794 Long term (current) use of insulin: Secondary | ICD-10-CM

## 2018-07-02 DIAGNOSIS — I110 Hypertensive heart disease with heart failure: Secondary | ICD-10-CM

## 2018-07-02 DIAGNOSIS — L97822 Non-pressure chronic ulcer of other part of left lower leg with fat layer exposed: Secondary | ICD-10-CM | POA: Diagnosis not present

## 2018-07-02 DIAGNOSIS — Z6841 Body Mass Index (BMI) 40.0 and over, adult: Secondary | ICD-10-CM | POA: Diagnosis not present

## 2018-07-02 DIAGNOSIS — E11622 Type 2 diabetes mellitus with other skin ulcer: Secondary | ICD-10-CM | POA: Diagnosis present

## 2018-07-02 DIAGNOSIS — L97512 Non-pressure chronic ulcer of other part of right foot with fat layer exposed: Secondary | ICD-10-CM | POA: Diagnosis not present

## 2018-07-02 NOTE — Patient Instructions (Signed)
You are here for follow up after hospital discharge- you will complete IV on 07/07/18 and your picc line ill be removed.

## 2018-07-02 NOTE — Progress Notes (Signed)
NAME: Adam Merritt  DOB: 1946-01-16  MRN: [id number]  Date/Time: 07/02/2018 11:33 AM Subjective:  Follow up visit after hospital discharge ? Adam Merritt is a 73 y.o. male with a history ofdiabetes mellitus, hyperlipidemia, hypertension, coronary artery disease, B/l Edema legs was recently in [facility] between for rt toe infection and underwent partial amputation of the toe- He also had lymphedema both legs and superficial ulceration of the lower extremities- He had group B streptococcus bacteremia which was  from his leg wounds/rt great toe- He was sent home with IV ceftriaxone thru PICC to complete 2 weeks. He is here for follow up.He is doing well- He saw podiatrist yesterday Past Medical History:  Diagnosis Date  . Anemia   . CAD (coronary artery disease)   . Chest pain   . Coronary artery disease   . Diabetes mellitus without complication ([place])   . Diastolic CHF ([place])   . Esophageal reflux   . Herpes zoster without mention of complication   . Hyperlipidemia   . Hypertension   . Insomnia   . Lumbago   . Lymphedema   . Neuropathy in diabetes ([place])   . Obstructive chronic bronchitis without exacerbation ([place])   . Other malaise and fatigue   . Stroke ([place])   . Varicose veins     Past Surgical History:  Procedure Laterality Date  . 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 CHOLECYSTECTOMY    . CORONARY ARTERY BYPASS GRAFT      Social History   Socioeconomic History  . Marital status: Married    Spouse name: Not on file  . Number of children: Not on file  . Years of education: Not on file  . Highest education level: Not on file  Occupational History  . Not on file  Social Needs  . Financial resource strain: Not on file  . Food insecurity:    Worry: Not on file    Inability: Not on file  . Transportation needs:    Medical: Not on file    Non-medical: Not on file    Tobacco Use  . Smoking status: Never Smoker  . Smokeless tobacco: Never Used  Substance and Sexual Activity  . Alcohol use: No    Comment: quit alcohol 30 years ago  . Drug use: No  . Sexual activity: Not on file  Lifestyle  . Physical activity:    Days per week: Not on file    Minutes per session: Not on file  . Stress: Not on file  Relationships  . Social connections:    Talks on phone: Not on file    Gets together: Not on file    Attends religious service: Not on file    Active member of club or organization: Not on file    Attends meetings of clubs or organizations: Not on file    Relationship status: Not on file  . Intimate partner violence:    Fear of current or ex partner: Not on file    Emotionally abused: Not on file    Physically abused: Not on file    Forced sexual activity: Not on file  Other Topics Concern  . Not on file  Social History Narrative   Lives at home with wife, uses a wheelchair now    Family History  Problem Relation Age of Onset  . Diabetes Mother   .  Hyperlipidemia Mother   . Hypertension Mother   . Diabetes Father   . Hyperlipidemia Father   . Hypertension Father   . CAD Father    No Known Allergies ID  Recent  Procedure Surgery Injections Trauma Sick contacts Travel Antibiotic use Food- raw/exotic Steroid/immune suppressants/splenectomy/Hardware Animal bites Tick exposure Water sports Fishing/hunting/animal bird exposure ? Current Outpatient Medications  Medication Sig Dispense Refill  . albuterol (PROVENTIL HFA;VENTOLIN HFA) 108 (90 Base) MCG/ACT inhaler INHALE 1 PUFF INTO THE LUNGS EVERY 6 HOURS AS NEEDED FOR WHEEZING OR SHORTNESS OF BREATH (Patient taking differently: Inhale 1 puff into the lungs every 6 (six) hours as needed for wheezing or shortness of breath. ) 8.5 g 0  . aspirin 325 MG EC tablet Take 325 mg by mouth daily.    Marland Kitchen atorvastatin (LIPITOR) 40 MG tablet Take 40 mg by mouth at bedtime.     . carvedilol  (COREG) 25 MG tablet Take 25 mg by mouth 2 (two) times daily with a meal.    . cefTRIAXone (ROCEPHIN) IVPB Inject 2 g into the vein daily for 12 days. Indication:  Group B streptococcus bacteremia Last Day of Therapy:  07/07/2018 Labs - Once weekly on Mondays:  CBC/D and CMP, Please pull PIC at completion of IV antibiotics 13 Units 0  . docusate sodium (COLACE) 50 MG capsule Take 50 mg by mouth 2 (two) times daily.    Marland Kitchen ENSURE MAX PROTEIN (ENSURE MAX PROTEIN) LIQD Take 330 mLs (11 oz total) by mouth 2 (two) times daily. 660 mL 0  . febuxostat (ULORIC) 40 MG tablet Take 1 tablet (40 mg total) by mouth daily. 90 tablet 3  . fentaNYL (DURAGESIC) 50 MCG/HR Place 1 patch onto the skin every 3 (three) days. 10 patch 0  . furosemide (LASIX) 40 MG tablet Take 1 tablet (40 mg total) by mouth daily. 30 tablet 0  . gabapentin (NEURONTIN) 300 MG capsule Take 1 capsule po in AM and 1 capsule po in pm. Take 2 capsules po at bedtime. 180 capsule 3  . HYDROcodone-acetaminophen (NORCO/VICODIN) 5-325 MG tablet Take 1 tablet by mouth every 6 (six) hours as needed for moderate pain or severe pain. 20 tablet 0  . indapamide (LOZOL) 2.5 MG tablet Take 1 tablet (2.5 mg total) by mouth daily. 90 tablet 3  . Insulin Glargine (BASAGLAR KWIKPEN) 100 UNIT/ML SOPN INJECT 30 TO 36 UNITS UNDER THE SKIN EVERY DAY (Patient taking differently: Inject 30-36 Units into the skin daily. ) 3 pen 0  . ipratropium-albuterol (DUONEB) 0.5-2.5 (3) MG/3ML SOLN Take 3 mLs by nebulization every 4 (four) hours as needed (for shortness of breath). 360 mL 3  . liraglutide (VICTOZA) 18 MG/3ML SOPN Inject 0.3 mLs (1.8 mg total) into the skin daily. 27 mL 3  . Multiple Vitamin (MULTIVITAMIN WITH MINERALS) TABS tablet Take 1 tablet by mouth daily.    Marland Kitchen omeprazole (PRILOSEC) 20 MG capsule Take 20 mg by mouth daily.    . pentoxifylline (TRENTAL) 400 MG CR tablet TAKE 1 TABLET BY MOUTH THREE TIMES DAILY FOR CLAUDICATION (Patient taking differently: Take  400 mg by mouth 3 (three) times daily. ) 270 tablet 0  . potassium chloride SA (K-DUR,KLOR-CON) 20 MEQ tablet Take 2 tablets (40 mEq total) by mouth 2 (two) times daily. 120 tablet 3  . sennosides-docusate sodium (SENOKOT-S) 8.6-50 MG tablet Take 1-2 tablets by mouth 2 (two) times daily.    Marland Kitchen zolpidem (AMBIEN) 5 MG tablet Take 1 tablet (5 mg total) by  mouth at bedtime as needed for sleep. 30 tablet 0   No current facility-administered medications for this visit.      Abtx:  Anti-infectives (From admission, onward)   None      REVIEW OF SYSTEMS:  Const: negative fever, negative chills, negative weight loss Eyes: negative diplopia or visual changes, negative eye pain ENT: negative coryza, negative sore throat Resp: negative cough, hemoptysis, dyspnea Cards: negative for chest pain, palpitations, lower extremity edema GU: negative for frequency, dysuria and hematuria GI: Negative for abdominal pain, diarrhea, bleeding, constipation Skin: negative for rash and pruritus Heme: negative for easy bruising and gum/nose bleeding MS: has myalgias, arthralgias, back pain and muscle weakness, is in wheel chair Neurolo:negative for headaches, dizziness, vertigo, memory problems  Psych: negative for feelings of anxiety, depression  Endocrine: no polyuria Allergy/Immunology- negative for any medication or food allergies ? Pertinent Positives include : Objective:  VITALS:  BP 119/74 (BP Location: Left Arm, Patient Position: Sitting, Cuff Size: Large)   Pulse 73   Temp 97.6 F (36.4 C) (Oral)   Ht 5\' 6"  (1.676 m)   Wt 297 lb (134.7 kg)   SpO2 93%   BMI 47.94 kg/m  PHYSICAL EXAM:  General: Alert, cooperative, no distress, appears stated age. In wheel chair, obese Head: Normocephalic, without obvious abnormality, atraumatic. Eyes: Conjunctivae clear, anicteric sclerae. Pupils are equal ENT Nares normal. No drainage or sinus tenderness. Lips, mucosa, and tongue normal. No Thrush Neck:  Supple, symmetrical, no adenopathy, thyroid: non tender no carotid bruit and no JVD. Back:did not examine Lungs: Clear to auscultation bilaterally. No Wheezing or Rhonchi. No rales. Heart: Regular rate and rhythm, no murmur, rub or gallop. Abdomen: did not examine Extremities: b/l severe lymphedema- compression wrap on his legs not removed Rt foot- great toe- surgical site doing well -with no erythema, discharge or infection   Sutures present Skin: No rashes or lesions. Or bruising Lymph: Cervical, supraclavicular normal. Neurologic: Grossly non-focal Pertinent Labs Lab Results CBC    Component Value Date/Time   WBC 5.8 06/25/2018 0029   RBC 3.68 (L) 06/25/2018 0029   HGB 12.0 (L) 06/25/2018 0029   HGB 13.8 09/28/2014 1320   HCT 36.0 (L) 06/25/2018 0029   HCT 40.9 09/28/2014 1320   PLT 158 06/25/2018 0029   PLT 142 (L) 09/28/2014 1320   MCV 97.8 06/25/2018 0029   MCV 92 09/28/2014 1320   MCH 32.6 06/25/2018 0029   MCHC 33.3 06/25/2018 0029   RDW 12.6 06/25/2018 0029   RDW 13.5 09/28/2014 1320   LYMPHSABS 0.6 (L) 06/21/2018 1624   LYMPHSABS 1.7 09/28/2014 1320   MONOABS 0.9 06/21/2018 1624   MONOABS 0.6 09/28/2014 1320   EOSABS 0.0 06/21/2018 1624   EOSABS 0.3 09/28/2014 1320   BASOSABS 0.1 06/21/2018 1624   BASOSABS 0.0 09/28/2014 1320    CMP Latest Ref Rng & Units 06/25/2018 06/24/2018 06/23/2018  Glucose 70 - 99 mg/dL 130(H) 113(H) 126(H)  BUN 8 - 23 mg/dL 30(H) 28(H) 30(H)  Creatinine 0.61 - 1.24 mg/dL 1.73(H) 1.68(H) 1.94(H)  Sodium 135 - 145 mmol/L 138 139 136  Potassium 3.5 - 5.1 mmol/L 3.4(L) 3.2(L) 3.1(L)  Chloride 98 - 111 mmol/L 102 103 100  CO2 22 - 32 mmol/L 30 30 26   Calcium 8.9 - 10.3 mg/dL 8.3(L) 8.2(L) 8.2(L)  Total Protein 6.5 - 8.1 g/dL - - -  Total Bilirubin 0.3 - 1.2 mg/dL - - -  Alkaline Phos 38 - 126 U/L - - -  AST 15 -  41 U/L - - -  ALT 0 - 44 U/L - - -      Microbiology: Recent Results (from the past 240 hour(s))  Aerobic/Anaerobic  Culture (surgical/deep wound)     Status: None   Collection Time: 06/23/18 12:34 PM  Result Value Ref Range Status   Specimen Description   Final    TISSUE Performed at [facility], [address] [place], [place] [address]    Special Requests   Final    NONE Performed at [facility], [address] [place], [place] [address]    Gram Stain NO WBC SEEN NO ORGANISMS SEEN   Final   Culture   Final    RARE GROUP B STREP(S.AGALACTIAE)ISOLATED TESTING AGAINST S. AGALACTIAE NOT ROUTINELY PERFORMED DUE TO PREDICTABILITY OF AMP/PEN/VAN SUSCEPTIBILITY. CRITICAL RESULT CALLED TO, READ BACK BY AND VERIFIED WITH: RN JOY B. ([place]) AT 1540 ON 06/25/18 BY C. JESSUP, MLT. RARE STAPHYLOCOCCUS AUREUS NO ANAEROBES ISOLATED Performed at [facility], [place] [address] [place], [place] [address]    Report Status 06/28/2018 FINAL  Final   Organism ID, Bacteria STAPHYLOCOCCUS AUREUS  Final      Susceptibility   Staphylococcus aureus - MIC*    CIPROFLOXACIN <=0.5 SENSITIVE Sensitive     ERYTHROMYCIN <=0.25 SENSITIVE Sensitive     GENTAMICIN <=0.5 SENSITIVE Sensitive     OXACILLIN 0.5 SENSITIVE Sensitive     TETRACYCLINE <=1 SENSITIVE Sensitive     VANCOMYCIN <=0.5 SENSITIVE Sensitive     TRIMETH/SULFA <=10 SENSITIVE Sensitive     CLINDAMYCIN <=0.25 SENSITIVE Sensitive     RIFAMPIN <=0.5 SENSITIVE Sensitive     Inducible Clindamycin NEGATIVE Sensitive     * RARE STAPHYLOCOCCUS AUREUS  CULTURE, BLOOD (ROUTINE X 2) w Reflex to ID Panel     Status: None   Collection Time: 06/25/18 12:28 AM  Result Value Ref Range Status   Specimen Description BLOOD BLOOD LEFT HAND  Final   Special Requests   Final    BOTTLES DRAWN AEROBIC AND ANAEROBIC Blood Culture adequate volume   Culture   Final    NO GROWTH 5 DAYS Performed at [facility], [address] [place], [place] [address]    Report Status 06/30/2018 FINAL  Final  CULTURE, BLOOD (ROUTINE X 2) w Reflex to ID Panel      Status: None   Collection Time: 06/25/18 12:30 AM  Result Value Ref Range Status   Specimen Description BLOOD BLOOD RIGHT HAND  Final   Special Requests   Final    BOTTLES DRAWN AEROBIC AND ANAEROBIC Blood Culture adequate volume   Culture   Final    NO GROWTH 5 DAYS Performed at [facility], [address], [place],  [address]    Report Status 06/30/2018 FINAL  Final   IMAGING RESULTS: ? Impression/Recommendation 73 y.o. male with a history of diabetes mellitus, hyperlipidemia, hypertension, coronary artery disease, B/l Edema legs  was admitted on 06/21/2018 with weakness, bilateral leg pain and unable to walk.  Patient has been followed by wound care clinic for bilateral lower extremity edema and right toe and left lower extremity ulceration ? ? __Group B streptococcus bacteremia-source is the rt great toe ulceration/infection and b/l leg ulceration. 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  therpaeutic amputation, surgical site well   B/l  chronic severe lymphedema " left > rt Followed at wound clinic  DM- on lantus  CAD/CHF_ on carvedilol, lasix and indapamide S/p CABG _________________________________________________ Discussed the management with patient and his wife . PICC will be removed after completion of antibioitc ? ? ___________________________________________________ Discussed with patient, requesting provider Note:  This document was prepared using Dragon voice recognition software and may include unintentional dictation errors.

## 2018-07-04 ENCOUNTER — Telehealth: Payer: Self-pay

## 2018-07-04 NOTE — Progress Notes (Signed)
MILAM, ALLBAUGH ([id number]) Visit Report for 07/02/2018 Arrival Information Details Patient Name: Adam Merritt, Adam Merritt Date of Service: 07/02/2018 12:45 PM Medical Record Number: [id number] Patient Account Number: [id number] Date of Birth/Sex: [birth year] (73 y.o. M) Treating RN: Harold Barban Primary Care Marcel Sorter: [name] Other Clinician: Referring Marvine Encalade: [name] Treating Lorrayne Ismael/Extender: Melburn Hake, [name]: 12 Visit Information History Since Last Visit Added or deleted any medications: No Patient Arrived: Wheel Chair Any new allergies or adverse reactions: No Arrival Time: 12:44 Had a fall or experienced change in No Accompanied By: wife activities of daily living that may affect Transfer Assistance: None risk of falls: Patient Identification Verified: Yes Signs or symptoms of abuse/neglect since last visito No Secondary Verification Process Yes Hospitalized since last visit: Yes Completed: Has Dressing in Place as Prescribed: Yes Patient Has Alerts: Yes Pain Present Now: Yes Patient Alerts: Patient on Blood Thinner Aspirin 325mg  Type II Diabetes ABI 5/19 L 1.1 R 0.9 TBI L 0.6 R 0.6 Electronic Signature(s) Signed: 07/02/2018 5:16:49 PM By: Harold Barban Entered By: Harold Barban on 07/02/2018 12:46:35 Adam Merritt ([id number]) -------------------------------------------------------------------------------- Clinic Level of Care Assessment Details Patient Name: Adam Merritt Date of Service: 07/02/2018 12:45 PM Medical Record Number: [id number] Patient Account Number: [id number] Date of Birth/Sex: 1946-03-15 (72 y.o. M) Treating RN: Harold Barban Primary Care Ector Laurel: [name] Other Clinician: Referring Zackrey Dyar: [name] Treating Yevette Knust/Extender: Melburn Hake, [name]: 12 Clinic Level of Care Assessment Items TOOL 3 Quantity Score []  - Use when EandM and Procedure is performed on FOLLOW-UP visit 0 ASSESSMENTS -  Nursing Assessment / Reassessment X - Reassessment of Co-morbidities (includes updates in patient status) 1 10 X- 1 5 Reassessment of Adherence to Treatment Plan ASSESSMENTS - Wound and Skin Assessment / Reassessment []  - Points for Wound Assessment can only be taken for a new wound of unknown or different 0 etiology and a procedure is NOT performed to that wound []  - 0 Simple Wound Assessment / Reassessment - one wound X- 3 5 Complex Wound Assessment / Reassessment - multiple wounds []  - 0 Dermatologic / Skin Assessment (not related to wound area) ASSESSMENTS - Focused Assessment X - Circumferential Edema Measurements - multi extremities 2 5 []  - 0 Nutritional Assessment / Counseling / Intervention []  - 0 Lower Extremity Assessment (monofilament, tuning fork, pulses) []  - 0 Peripheral Arterial Disease Assessment (using hand held doppler) ASSESSMENTS - Ostomy and/or Continence Assessment and Care []  - Incontinence Assessment and Management 0 []  - 0 Ostomy Care Assessment and Management (repouching, etc.) PROCESS - Coordination of Care []  - Points for Discharge Coordination can only be taken for a new wound of unknown or different 0 etiology and a procedure is NOT performed to that wound X- 1 15 Simple Patient / Family Education for ongoing care []  - 0 Complex (extensive) Patient / Family Education for ongoing care X- 1 10 Staff obtains Programmer, systems, Records, Test Results / Process Orders []  - 0 Staff telephones HHA, Nursing Homes / Clarify orders / etc []  - 0 Routine Transfer to another Facility (non-emergent condition) []  - 0 Routine Hospital Admission (non-emergent condition) RAYNOLD, BLANKENBAKER ([id number]) []  - 0 New Admissions / Biomedical engineer / Ordering NPWT, Apligraf, etc. []  - 0 Emergency Hospital Admission (emergent condition) X- 1 10 Simple Discharge Coordination []  - 0 Complex (extensive) Discharge Coordination PROCESS - Special Needs []  - Pediatric /  Minor Patient Management 0 []  - 0 Isolation Patient Management []  - 0 Hearing /  Language / Visual special needs []  - 0 Assessment of Community assistance (transportation, D/C planning, etc.) []  - 0 Additional assistance / Altered mentation []  - 0 Support Surface(s) Assessment (bed, cushion, seat, etc.) INTERVENTIONS - Wound Cleansing / Measurement []  - Points for Wound Cleaning / Measurement, Wound Dressing, Specimen Collection and 0 Specimen taken to lab can only be taken for a new wound of unknown or different etiology and a procedure is NOT performed to that wound []  - 0 Simple Wound Cleansing - one wound X- 3 5 Complex Wound Cleansing - multiple wounds X- 1 5 Wound Imaging (photographs - any number of wounds) []  - 0 Wound Tracing (instead of photographs) []  - 0 Simple Wound Measurement - one wound X- 3 5 Complex Wound Measurement - multiple wounds INTERVENTIONS - Wound Dressings X - Small Wound Dressing one or multiple wounds 3 10 []  - 0 Medium Wound Dressing one or multiple wounds []  - 0 Large Wound Dressing one or multiple wounds INTERVENTIONS - Miscellaneous []  - External ear exam 0 []  - 0 Specimen Collection (cultures, biopsies, blood, body fluids, etc.) []  - 0 Specimen(s) / Culture(s) sent or taken to Lab for analysis []  - 0 Patient Transfer (multiple staff / Harrel Lemon Lift / Similar devices) []  - 0 Simple Staple / Suture removal (25 or less) []  - 0 Complex Staple / Suture removal (26 or more) ELAI, VANWYK. ([id number]) []  - 0 Hypo / Hyperglycemic Management (close monitor of Blood Glucose) []  - 0 Ankle / Brachial Index (ABI) - do not check if billed separately X- 1 5 Vital Signs Has the patient been seen at the hospital within the last three years: Yes Total Score: 145 Level Of Care: New/Established - Level 4 Electronic Signature(s) Signed: 07/02/2018 5:16:49 PM By: Harold Barban Entered By: Harold Barban on 07/02/2018 13:51:26 Adam Merritt  ([id number]) -------------------------------------------------------------------------------- Lower Extremity Assessment Details Patient Name: Adam Merritt Date of Service: 07/02/2018 12:45 PM Medical Record Number: [id number] Patient Account Number: [id number] Date of Birth/Sex: March 24, 1946 (72 y.o. M) Treating RN: Harold Barban Primary Care Genesee Nase: [name] Other Clinician: Referring Drusilla Wampole: [name] Treating Ernesteen Mihalic/Extender: Melburn Hake, [name]: 12 Edema Assessment Assessed: [Left: No] [Right: No] [Left: Edema] [Right: :] Calf Left: Right: Point of Measurement: 34 cm From Medial Instep 60.5 cm 58.3 cm Ankle Left: Right: Point of Measurement: 12 cm From Medial Instep 38.5 cm 33.5 cm Vascular Assessment Pulses: Dorsalis Pedis Palpable: [Right:No] Posterior Tibial Palpable: [Left:No] [Right:No] Extremity colors, hair growth, and conditions: Extremity Color: [Left:Hyperpigmented] [Right:Hyperpigmented] Hair Growth on Extremity: [Left:No] [Right:No] Temperature of Extremity: [Left:Warm] [Right:Warm] Capillary Refill: [Left:< 3 seconds] Electronic Signature(s) Signed: 07/02/2018 5:16:49 PM By: Harold Barban Entered By: Harold Barban on 07/02/2018 13:08:02 Adam Merritt ([id number]) -------------------------------------------------------------------------------- Multi Wound Chart Details Patient Name: Adam Merritt Date of Service: 07/02/2018 12:45 PM Medical Record Number: [id number] Patient Account Number: [id number] Date of Birth/Sex: 12-30-1945 (72 y.o. M) Treating RN: Harold Barban Primary Care Alysson Geist: [name] Other Clinician: Referring Dayna Geurts: [name] Treating Jerson Furukawa/Extender: Melburn Hake, [name]: 12 Vital Signs Height(in): 66 Pulse(bpm): 73 Weight(lbs): 323 Blood Pressure(mmHg): 143/76 Body Mass Index(BMI): 52 Temperature(F): 97.7 Respiratory Rate 18 (breaths/min): Photos: [14:No Photos] [15:No  Photos] [16:No Photos] Wound Location: [14:Right Toe Great] [15:Left Lower Leg] [16:Left Lower Leg - Lateral] Wounding Event: [14:Trauma] [15:Trauma] [16:Blister] Primary Etiology: [14:Trauma, Other] [15:Trauma, Other] [16:Venous Leg Ulcer] Comorbid History: [14:N/A] [15:Anemia, Lymphedema, Chronic Obstructive Pulmonary Disease (COPD), Sleep Apnea, Congestive Heart Failure, Coronary  Artery Disease, Hypertension, Peripheral Venous Disease, Type II Diabetes, Osteoarthritis, Neuropathy,  Received Chemotherapy] [16:Anemia, Lymphedema, Chronic Obstructive Pulmonary Disease (COPD), Sleep Apnea, Congestive Heart Failure, Coronary Artery Disease, Hypertension, Peripheral Venous Disease, Type II Diabetes, Osteoarthritis, Neuropathy, Received  Chemotherapy] Date Acquired: [14:05/22/2018] [15:05/22/2018] [16:05/20/2018] Weeks of Treatment: [14:5] [15:5] [16:3] Wound Status: [14:Open] [15:Open] [16:Open] Measurements L x W x D [14:0.9x1.4x0.2] [15:3x25x0.1] [16:0.1x0.1x0.1] (cm) Area (cm) : [14:0.99] [32:20.254] [16:0.008] Volume (cm) : [14:0.198] [15:5.89] [16:0.001] % Reduction in Area: [14:30.70%] [15:-6846.30%] [16:99.90%] % Reduction in Volume: [14:-38.50%] [15:-6829.40%] [16:99.90%] Classification: [14:Full Thickness Without Exposed Support Structures] [15:Full Thickness Without Exposed Support Structures] [16:Partial Thickness] Exudate Amount: [14:N/A] [15:Large] [16:Large] Exudate Type: [14:N/A] [15:Serosanguineous] [16:Serosanguineous] Exudate Color: [14:N/A] [15:red, brown] [16:red, brown] Wound Margin: [14:N/A] [15:Flat and Intact] [16:Flat and Intact] Granulation Amount: [14:N/A] [15:Large (67-100%)] [16:Large (67-100%)] Granulation Quality: [14:N/A] [15:Red, Hyper-granulation] [16:Red] Necrotic Amount: [14:N/A] [15:Small (1-33%)] [16:Small (1-33%)] Epithelialization: [14:N/A] [15:None] [16:N/A] Periwound Skin Texture: [14:No Abnormalities Noted] [15:Excoriation: No Induration: No  Callus: No Crepitus: No] [16:Excoriation: No Induration: No Callus: No Crepitus: No] Rash: No Rash: No Scarring: No Scarring: No Periwound Skin Moisture: No Abnormalities Noted Maceration: No Maceration: No Dry/Scaly: No Dry/Scaly: No Periwound Skin Color: No Abnormalities Noted Atrophie Blanche: No Atrophie Blanche: No Cyanosis: No Cyanosis: No Ecchymosis: No Ecchymosis: No Erythema: No Erythema: No Hemosiderin Staining: No Hemosiderin Staining: No Mottled: No Mottled: No Pallor: No Pallor: No Rubor: No Rubor: No Tenderness on Palpation: No Yes Yes Wound Preparation: N/A Ulcer Cleansing: Ulcer Cleansing: Rinsed/Irrigated with Saline, Rinsed/Irrigated with Saline, Other: soap and water Other: soap and water Topical Anesthetic Applied: Topical Anesthetic Applied: None Other: lidocaine 4% Wound Number: 17 N/A N/A Photos: No Photos N/A N/A Wound Location: Right Lower Leg - Lateral N/A N/A Wounding Event: Blister N/A N/A Primary Etiology: Venous Leg Ulcer N/A N/A Comorbid History: Anemia, Lymphedema, N/A N/A Chronic Obstructive Pulmonary Disease (COPD), Sleep Apnea, Congestive Heart Failure, Coronary Artery Disease, Hypertension, Peripheral Venous Disease, Type II Diabetes, Osteoarthritis, Neuropathy, Received Chemotherapy Date Acquired: 05/20/2018 N/A N/A Weeks of Treatment: 3 N/A N/A Wound Status: Open N/A N/A Measurements L x W x D 5x1.4x0.1 N/A N/A (cm) Area (cm) : 5.498 N/A N/A Volume (cm) : 0.55 N/A N/A % Reduction in Area: 91.20% N/A N/A % Reduction in Volume: 91.20% N/A N/A Classification: Partial Thickness N/A N/A Exudate Amount: Large N/A N/A Exudate Type: Serosanguineous N/A N/A Exudate Color: red, brown N/A N/A Wound Margin: Flat and Intact N/A N/A Granulation Amount: Large (67-100%) N/A N/A Granulation Quality: Red N/A N/A Necrotic Amount: Small (1-33%) N/A N/A Exposed Structures: Fat Layer (Subcutaneous N/A N/A Tissue) Exposed:  Yes Fascia: No Tendon: No Muscle: No LEOBARDO, GRANLUND ([id number]) Joint: No Bone: No Epithelialization: N/A N/A N/A Periwound Skin Texture: Excoriation: No N/A N/A Induration: No Callus: No Crepitus: No Rash: No Scarring: No Periwound Skin Moisture: Maceration: No N/A N/A Dry/Scaly: No Periwound Skin Color: Atrophie Blanche: No N/A N/A Cyanosis: No Ecchymosis: No Erythema: No Hemosiderin Staining: No Mottled: No Pallor: No Rubor: No Tenderness on Palpation: Yes N/A N/A Wound Preparation: Ulcer Cleansing: N/A N/A Rinsed/Irrigated with Saline, Other: soap and water Topical Anesthetic Applied: Other: lidocaine 4% Treatment Notes Electronic Signature(s) Signed: 07/02/2018 5:16:49 PM By: Harold Barban Entered By: Harold Barban on 07/02/2018 13:35:42 Adam Merritt ([id number]) -------------------------------------------------------------------------------- [place] Details Patient Name: Adam Merritt Date of Service: 07/02/2018 12:45 PM Medical Record Number: [id number] Patient Account Number: [id number] Date of Birth/Sex: 12-28-1945 (72 y.o. M) Treating RN: Harold Barban  Primary Care Khadeem Rockett: [name] Other Clinician: Referring Jenavive Lamboy: [name] Treating Caileb Rhue/Extender: Melburn Hake, [name]: 12 Active Inactive Abuse / Safety / Falls / Self Care Management Nursing Diagnoses: Potential for falls Goals: Patient will remain injury free related to falls Date Initiated: 04/09/2018 Target Resolution Date: 07/13/2018 Goal Status: Active Interventions: Assess fall risk on admission and as needed Notes: Orientation to the Wound Care Program Nursing Diagnoses: Knowledge deficit related to the wound healing center program Goals: Patient/caregiver will verbalize understanding of the [place] Program Date Initiated: 04/09/2018 Target Resolution Date: 07/13/2018 Goal Status: Active Interventions: Provide  education on orientation to the wound center Notes: Venous Leg Ulcer Nursing Diagnoses: Actual venous Insuffiency (use after diagnosis is confirmed) Goals: Patient will maintain optimal edema control Date Initiated: 04/09/2018 Target Resolution Date: 07/13/2018 Goal Status: Active Interventions: Assess peripheral edema status every visit. RAYVON, DAKIN ([id number]) Compression as ordered Provide education on venous insufficiency Notes: Electronic Signature(s) Signed: 07/02/2018 5:16:49 PM By: Harold Barban Entered By: Harold Barban on 07/02/2018 13:35:35 Adam Merritt ([id number]) -------------------------------------------------------------------------------- Non-Wound Condition Assessment Details Patient Name: Adam Merritt Date of Service: 07/02/2018 12:45 PM Medical Record Number: [id number] Patient Account Number: [id number] Date of Birth/Sex: 1946/05/28 (72 y.o. M) Treating RN: Harold Barban Primary Care Melah Ebling: [name] Other Clinician: Referring Deandrae Wajda: [name] Treating Seeley Southgate/Extender: STONE III, [name]: 12 Non-Wound Condition: Condition: Lymphedema Location: Leg Side: Bilateral Photos Periwound Skin Texture Texture Color No Abnormalities Noted: No No Abnormalities Noted: No Callus: No Atrophie Blanche: No Crepitus: No Cyanosis: No Excoriation: No Ecchymosis: No Friable: No Erythema: No Induration: Yes Hemosiderin Staining: Yes Rash: No Mottled: No Scarring: No Pallor: No Rubor: No Moisture No Abnormalities Noted: No Temperature / Pain Dry / Scaly: No Temperature: No Abnormality Maceration: No Electronic Signature(s) Unsigned Entered By: Harold Barban on 07/03/2018 17:39:54 Signature(s): Date(s): Adam Merritt ([id number]) -------------------------------------------------------------------------------- Pain Assessment Details Patient Name: GALDINO, HINCHMAN Date of Service: 07/02/2018 12:45 PM Medical  Record Number: [id number] Patient Account Number: [id number] Date of Birth/Sex: 1945-07-09 (73 y.o. M) Treating RN: Harold Barban Primary Care Langley Flatley: [name] Other Clinician: Referring Darika Ildefonso: [name] Treating Lizandro Spellman/Extender: Melburn Hake, [name]: 12 Active Problems Location of Pain Severity and Description of Pain Patient Has Paino Yes Site Locations Rate the pain. Current Pain Level: 7 Pain Management and Medication Current Pain Management: Electronic Signature(s) Signed: 07/02/2018 5:16:49 PM By: Harold Barban Entered By: Harold Barban on 07/02/2018 12:46:45 Adam Merritt ([id number]) -------------------------------------------------------------------------------- Patient/Caregiver Education Details Patient Name: Adam Merritt Date of Service: 07/02/2018 12:45 PM Medical Record Number: [id number] Patient Account Number: [id number] Date of Birth/Gender: 12-01-1945 (72 y.o. M) Treating RN: Harold Barban Primary Care Physician: [name] Other Clinician: Referring Physician: [name] Treating Physician/Extender: Sharalyn Ink in Treatment: 12 Education Assessment Education Provided To: Patient Education Topics Provided Wound/Skin Impairment: Handouts: Caring for Your Ulcer Methods: Demonstration Responses: State content correctly Electronic Signature(s) Signed: 07/02/2018 5:16:49 PM By: Harold Barban Entered By: Harold Barban on 07/02/2018 13:35:58 Adam Merritt ([id number]) -------------------------------------------------------------------------------- Wound Assessment Details Patient Name: Adam Merritt Date of Service: 07/02/2018 12:45 PM Medical Record Number: [id number] Patient Account Number: [id number] Date of Birth/Sex: 01-29-1946 (72 y.o. M) Treating RN: Harold Barban Primary Care Adonis Ryther: [name] Other Clinician: Referring Benford Asch: [name] Treating Katelen Luepke/Extender: STONE III, [name] in  Treatment: 12 Wound Status Wound Number: 14 Primary Etiology: Trauma, Other Wound Location: Right Toe Great Wound Status: Open Wounding  Event: Trauma Date Acquired: 05/22/2018 Weeks Of Treatment: 5 Clustered Wound: No Wound Measurements Length: (cm) 0.9 Width: (cm) 1.4 Depth: (cm) 0.2 Area: (cm) 0.99 Volume: (cm) 0.198 % Reduction in Area: 30.7% % Reduction in Volume: -38.5% Wound Description Full Thickness Without Exposed Support Classification: Structures Periwound Skin Texture Texture Color No Abnormalities Noted: No No Abnormalities Noted: No Moisture No Abnormalities Noted: No Electronic Signature(s) Signed: 07/02/2018 5:16:49 PM By: Harold Barban Entered By: Harold Barban on 07/02/2018 13:03:43 Adam Merritt ([id number]) -------------------------------------------------------------------------------- Wound Assessment Details Patient Name: Adam Merritt Date of Service: 07/02/2018 12:45 PM Medical Record Number: [id number] Patient Account Number: [id number] Date of Birth/Sex: [birth year] (72 y.o. M) Treating RN: Harold Barban Primary Care Dominigue Gellner: [name] Other Clinician: Referring Hayden Mabin: [name] Treating Zyrah Wiswell/Extender: Melburn Hake, [name]: 12 Wound Status Wound Number: 15 Primary Trauma, Other Etiology: Wound Location: Left Lower Leg Wound Open Wounding Event: Trauma Status: Date Acquired: 05/22/2018 Comorbid Anemia, Lymphedema, Chronic Obstructive Weeks Of Treatment: 5 History: Pulmonary Disease (COPD), Sleep Apnea, Clustered Wound: No Congestive Heart Failure, Coronary Artery Disease, Hypertension, Peripheral Venous Disease, Type II Diabetes, Osteoarthritis, Neuropathy, Received Chemotherapy Wound Measurements Length: (cm) 3 Width: (cm) 25 Depth: (cm) 0.1 Area: (cm) 58.905 Volume: (cm) 5.89 % Reduction in Area: -6846.3% % Reduction in Volume: -6829.4% Epithelialization: None Tunneling:  No Undermining: No Wound Description Full Thickness Without Exposed Support Classification: Structures Wound Margin: Flat and Intact Exudate Large Amount: Exudate Type: Serosanguineous Exudate Color: red, brown Foul Odor After Cleansing: No Slough/Fibrino No Wound Bed Granulation Amount: Large (67-100%) Exposed Structure Granulation Quality: Red, Hyper-granulation Fascia Exposed: No Necrotic Amount: Small (1-33%) Fat Layer (Subcutaneous Tissue) Exposed: Yes Necrotic Quality: Adherent Slough Tendon Exposed: No Muscle Exposed: No Joint Exposed: No Bone Exposed: No Periwound Skin Texture Texture Color No Abnormalities Noted: No No Abnormalities Noted: No Callus: No Atrophie Blanche: No Crepitus: No Cyanosis: No Excoriation: No Ecchymosis: No Induration: No Erythema: No Rash: No Hemosiderin Staining: No Scarring: No Mottled: No KAZUMI, LACHNEY ([id number]) Moisture Pallor: No No Abnormalities Noted: No Rubor: No Dry / Scaly: No Temperature / Pain Maceration: No Tenderness on Palpation: Yes Wound Preparation Ulcer Cleansing: Rinsed/Irrigated with Saline, Other: soap and water, Topical Anesthetic Applied: None Electronic Signature(s) Signed: 07/02/2018 5:16:49 PM By: Harold Barban Entered By: Harold Barban on 07/02/2018 13:04:03 Adam Merritt ([id number]) -------------------------------------------------------------------------------- Wound Assessment Details Patient Name: Adam Merritt Date of Service: 07/02/2018 12:45 PM Medical Record Number: [id number] Patient Account Number: [id number] Date of Birth/Sex: 13-Mar-1946 (72 y.o. M) Treating RN: Harold Barban Primary Care Angi Goodell: [name] Other Clinician: Referring Bessie Livingood: [name] Treating Zafir Schauer/Extender: Melburn Hake, [name]: 12 Wound Status Wound Number: 16 Primary Venous Leg Ulcer Etiology: Wound Location: Left Lower Leg - Lateral Wound Open Wounding Event:  Blister Status: Date Acquired: 05/20/2018 Comorbid Anemia, Lymphedema, Chronic Obstructive Weeks Of Treatment: 3 History: Pulmonary Disease (COPD), Sleep Apnea, Clustered Wound: No Congestive Heart Failure, Coronary Artery Disease, Hypertension, Peripheral Venous Disease, Type II Diabetes, Osteoarthritis, Neuropathy, Received Chemotherapy Photos Photo Uploaded By: Harold Barban on 07/03/2018 17:38:02 Wound Measurements Length: (cm) 0.1 Width: (cm) 0.1 Depth: (cm) 0.1 Area: (cm) 0.008 Volume: (cm) 0.001 % Reduction in Area: 99.9% % Reduction in Volume: 99.9% Tunneling: No Undermining: No Wound Description Classification: Partial Thickness Wound Margin: Flat and Intact Exudate Amount: Large Exudate Type: Serosanguineous Exudate Color: red, brown Foul Odor After Cleansing: No Slough/Fibrino Yes Wound Bed Granulation Amount: Large (67-100%) Exposed Structure Granulation Quality: Red Fascia Exposed:  No Necrotic Amount: Small (1-33%) Fat Layer (Subcutaneous Tissue) Exposed: No Necrotic Quality: Adherent Slough Tendon Exposed: No Muscle Exposed: No Joint Exposed: No TIRAN, SAUSEDA ([id number]) Bone Exposed: No Periwound Skin Texture Texture Color No Abnormalities Noted: No No Abnormalities Noted: No Callus: No Atrophie Blanche: No Crepitus: No Cyanosis: No Excoriation: No Ecchymosis: No Induration: No Erythema: No Rash: No Hemosiderin Staining: No Scarring: No Mottled: No Pallor: No Moisture Rubor: No No Abnormalities Noted: No Dry / Scaly: No Temperature / Pain Maceration: No Tenderness on Palpation: Yes Wound Preparation Ulcer Cleansing: Rinsed/Irrigated with Saline, Other: soap and water, Topical Anesthetic Applied: Other: lidocaine 4%, Electronic Signature(s) Signed: 07/02/2018 5:16:49 PM By: Harold Barban Entered By: Harold Barban on 07/02/2018 13:04:24 Adam Merritt  ([id number]) -------------------------------------------------------------------------------- Wound Assessment Details Patient Name: Adam Merritt Date of Service: 07/02/2018 12:45 PM Medical Record Number: [id number] Patient Account Number: [id number] Date of Birth/Sex: 1946/02/04 (72 y.o. M) Treating RN: Harold Barban Primary Care Teola Felipe: [name] Other Clinician: Referring Ariv Penrod: [name] Treating Rohil Lesch/Extender: Melburn Hake, [name]: 12 Wound Status Wound Number: 17 Primary Venous Leg Ulcer Etiology: Wound Location: Right Lower Leg - Lateral Wound Open Wounding Event: Blister Status: Date Acquired: 05/20/2018 Comorbid Anemia, Lymphedema, Chronic Obstructive Weeks Of Treatment: 3 History: Pulmonary Disease (COPD), Sleep Apnea, Clustered Wound: No Congestive Heart Failure, Coronary Artery Disease, Hypertension, Peripheral Venous Disease, Type II Diabetes, Osteoarthritis, Neuropathy, Received Chemotherapy Photos Photo Uploaded By: Harold Barban on 07/03/2018 17:38:03 Wound Measurements Length: (cm) 5 Width: (cm) 1.4 Depth: (cm) 0.1 Area: (cm) 5.498 Volume: (cm) 0.55 % Reduction in Area: 91.2% % Reduction in Volume: 91.2% Tunneling: No Undermining: No Wound Description Classification: Partial Thickness Wound Margin: Flat and Intact Exudate Amount: Large Exudate Type: Serosanguineous Exudate Color: red, brown Foul Odor After Cleansing: No Slough/Fibrino Yes Wound Bed Granulation Amount: Large (67-100%) Exposed Structure Granulation Quality: Red Fascia Exposed: No Necrotic Amount: Small (1-33%) Fat Layer (Subcutaneous Tissue) Exposed: Yes Necrotic Quality: Adherent Slough Tendon Exposed: No Muscle Exposed: No Joint Exposed: No KAMORI, BARBIER ([id number]) Bone Exposed: No Periwound Skin Texture Texture Color No Abnormalities Noted: No No Abnormalities Noted: No Callus: No Atrophie Blanche: No Crepitus: No Cyanosis:  No Excoriation: No Ecchymosis: No Induration: No Erythema: No Rash: No Hemosiderin Staining: No Scarring: No Mottled: No Pallor: No Moisture Rubor: No No Abnormalities Noted: No Dry / Scaly: No Temperature / Pain Maceration: No Tenderness on Palpation: Yes Wound Preparation Ulcer Cleansing: Rinsed/Irrigated with Saline, Other: soap and water, Topical Anesthetic Applied: Other: lidocaine 4%, Electronic Signature(s) Signed: 07/02/2018 5:16:49 PM By: Harold Barban Entered By: Harold Barban on 07/02/2018 13:05:08 Adam Merritt ([id number]) -------------------------------------------------------------------------------- [place] Details Patient Name: Adam Merritt Date of Service: 07/02/2018 12:45 PM Medical Record Number: [id number] Patient Account Number: [id number] Date of Birth/Sex: 09-12-1945 (72 y.o. M) Treating RN: Harold Barban Primary Care Josephmichael Lisenbee: [name] Other Clinician: Referring Archer Moist: [name] Treating Lillien Petronio/Extender: Melburn Hake, [name]: 12 Vital Signs Time Taken: 12:46 Temperature (F): 97.7 Height (in): 66 Pulse (bpm): 73 Weight (lbs): 323 Respiratory Rate (breaths/min): 18 Body Mass Index (BMI): 52.1 Blood Pressure (mmHg): 143/76 Reference Range: 80 - 120 mg / dl Airway Electronic Signature(s) Signed: 07/02/2018 5:16:49 PM By: Harold Barban Entered By: Harold Barban on 07/02/2018 12:47:03

## 2018-07-04 NOTE — Telephone Encounter (Signed)
Gave verbal [phone] [facility]  For 2 week for 2 weeks

## 2018-07-05 NOTE — Progress Notes (Signed)
Adam Merritt, Adam Merritt ([id number]) Visit Report for 07/02/2018 Chief Complaint Document Details Patient Name: Adam Merritt Date of Service: 07/02/2018 12:45 PM Medical Record Number: [id number] Patient Account Number: [id number] Date of Birth/Sex: 06/15/1945 (73 y.o. M) Treating RN: Montey Hora Primary Care Provider: [name] Other Clinician: Referring Provider: [name] Treating Provider/Extender: Melburn Hake, [name]: 12 Information Obtained from: Patient Chief Complaint he is here for evaluation of bilateral lower extremity lymphedema right right toe and left LE ulcers Electronic Signature(s) Signed: 07/04/2018 1:01:02 AM By: Worthy Keeler PA-C Entered By: Worthy Keeler on 07/02/2018 13:09:49 Adam Merritt ([id number]) -------------------------------------------------------------------------------- HPI Details Patient Name: Adam Merritt Date of Service: 07/02/2018 12:45 PM Medical Record Number: [id number] Patient Account Number: [id number] Date of Birth/Sex: [birth year] (73 y.o. M) Treating RN: Montey Hora Primary Care Provider: [name] Other Clinician: Referring Provider: [name] Treating Provider/Extender: Melburn Hake, [name]: 12 History of Present Illness HPI Description: 10/18/17-He is here for initial evaluation of bilateral lower extremity ulcerations in the presence of venous insufficiency and lymphedema. He has been seen by vascular medicine in the past, Dr. Lucky Cowboy, last seen in 2016. He does have a history of abnormal ABIs, which is to be expected given his lymphedema and venous insufficiency. According to Epic, it appears that all attempts for arterial evaluation and/or angiography were not follow through with by patient. He does have a history of being seen in lymphedema clinic in 2018, stopped going approximately 6 months ago stating "it didn't do any good". He does not have lymphedema pumps, he does not have custom fit  compression wrap/stockings. He is diabetic and his recent A1c last month was 7.6. He admits to chronic bilateral lower extremity pain, no change in pain since blister and ulceration development. He is currently being treated with Levaquin for bronchitis. He has home health and we will continue. 10/25/17-He is here in follow-up evaluation for bilateral lower extremity ulcerationssubtle he remains on Levaquin for bronchitis. Right lower extremity with no evidence of drainage or ulceration, persistent left lower extremity ulceration. He states that home health has not been out since his appointment. He went to [facility] on Tuesday, studies revealed: RIGHT ABI 0.9, TBI 0.6 LEFT ABI 1.1, TBI 0.6 with triphasic flow bilaterally. We will continue his same treatment plan. He has been educated on compression therapy and need for elevation. He will benefit from lymphedema pumps 11/01/17-He is here in follow-up evaluation for left lower extremity ulcer. The right lower extremity remains healed. He has home health services, but they have not been out to see the patient for 2-3 weeks. He states it home health physical therapy changed his dressing yesterday after therapy; he placed Ace wrap compression. We are still waiting for lymphedema pumps, reordered d/t need for company change. 11/08/17-He is here in follow-up evaluation for left lower extremity ulcer. It is improved. Edema is significantly improved with compression therapy. We will continue with same treatment plan and he will follow-up next week. No word regarding lymphedema pumps 11/15/17-He is here in follow-up evaluation for left lower extremity ulcer. He is healed and will be discharged from wound care services. I have reached out to [facility] regarding his lymphedema pumps. They have been unable to reach the patient; the contact number they had with the patient's wife's cell phone and she has not answered any unrecognized  calls. Contact should be made today, trial planned for next week; [facility] will continue to  follow 11/27/17 on evaluation today patient has multiple blistered areas over the right lower extremity his left lower extremity appears to be doing okay. These blistered areas show signs of no infection which is great news. With that being said he did have some necrotic skin overlying which was mechanically debrided away with saline and gauze today without complication. Overall post debridement the wounds appear to be doing better but in general his swelling seems to be increased. This is obviously not good news. I think this is what has given rise to the blisters. 12/04/17 on evaluation today patient presents for follow-up concerning his bilateral lower extremity edema in the right lower extremity ulcers. He has been tolerating the dressing changes without complication. With that being said he has had no real issues with the wraps which is also good news. Overall I'm pleased with the progress he's been making. 12/11/17 on evaluation today patient appears to be doing rather well in regard to his right lateral lower extremity ulcer. He's been tolerating the dressing changes without complication. Fortunately there does not appear to be any evidence of infection at this time. Overall I'm pleased with the progress that is being made. Unfortunately he has been in the hospital due to having what sounds to be a stomach virus/flu fortunately that is starting to get better. 12/18/17 on evaluation today patient actually appears to be doing very well in regard to his bilateral lower extremities the swelling is under fairly good control his lymphedema pumps are still not up and running quite yet. With that being said he does have several areas of opening noted as far as wounds are concerned mainly over the left lower extremity. With that being said I do believe once he gets lymphedema pumps this would least help you  mention some the fluid and preventing this from occurring. Hopefully that will be set up soon sleeves are Artie in place at his home he just waiting for the machine. 12/25/17 on evaluation today patient actually appears to be doing excellent in fact all of his ulcers appear to have resolved his Adam Merritt, HEARNS. ([id number]) legs appear very well. I do think he needs compression stockings we have discussed this and they are actually going to go to [facility] today to elastic therapy to get this fitted for him. I think that is definitely a good thing to do. Readmission: 04/09/18 upon evaluation today this patient is seen for readmission due to bilateral lower extremity lymphedema. He has significant swelling of his extremities especially on the left although the right is also swollen he has weeping from both sides. There are no obvious open wounds at this point. Fortunately he has been doing fairly well for quite a bit of time since I last saw him. Nonetheless unfortunately this seems to have reopened and is giving quite a bit of trouble. He states this began about a week ago when he first called [place] to get in to be seen. No fevers, chills, nausea, or vomiting noted at this time. He has not been using his lymphedema pumps due to the fact that they won't fit on his leg at this point likewise is also not been using his compression for essentially the same reason. 04/16/18 upon evaluation today patient actually appears to be doing a little better in regard to the fluid in his bilateral lower extremities. With that being said he's had three falls since I saw him last week. He also states that he's been feeling very poorly. I was  concerned last week and feel like that the concern is still there as far as the congestion in his chest is concerned he seems to be breathing about the same as last week but again he states he's very weak he's not even able to walk further than from the chair to the door. His wife had  to buy a wheelchair just to be able to get them out of the house to get to the appointment today. This has me very concerned. 04/23/18 on evaluation today patient actually appears to be doing much better than last week's evaluation. At that time actually had to transport him to the ER via EMS and he subsequently was admitted for acute pulmonary edema, acute renal failure, and acute congestive heart failure. Fortunately he is doing much better. Apparently they did dialyze him and were able to take off roughly 35 pounds of fluid. Nonetheless he is feeling much better both in regard to his breathing and he's able to get around much better at this time compared to previous. Overall I'm very happy with how things are at this time. There does not appear to be any evidence of infection currently. No fevers, chills, nausea, or vomiting noted at this time. 04/30/2018 patient seen today for follow-up and management of bilateral lower extremity lymphedema. He did express being more sad today than usual due to the recent loss of his dog. He states that he has been compliant with using the lymphedema pumps. However he does admit a minute over the last 2-3 days he has not been using the pumps due to the recent loss of his dog. At this time there is no drainage or open wounds to his lower extremities. The left leg edema is measuring smaller today. Still has a significant amount of edema on bilateral lower extremities With dry flaky skin. He will be referred to the lymphedema clinic for further management. Will continue 3 layer compression wraps and follow-up in 1-2 weeks.Denies any pain, fever, chairs recently. No recent falls or injuries reported during this visit. 05/07/18 on evaluation today patient actually appears to be doing very well in regard to his lower extremities in general all things considered. With that being said he is having some pain in the legs just due to the amount of swelling. He does have an  area where he had a blister on the left lateral lower extremity this is open at this point other than that there's nothing else weeping at this time. 05/14/18 on evaluation today patient actually appears to be doing excellent all things considered in regard to his lower extremities. He still has a couple areas of weeping on each leg which has continued to be the issue for him. He does have an appointment with the lymphedema clinic although this isn't until February 2020. That was the earliest they had. In the meantime he has continued to tolerate the compression wraps without complication. 05/28/18 on evaluation today patient actually appears to be doing more poorly in regard to his left lower extremity where he has a wound open at this point. He also had a fall where he subsequently injured his right great toe which has led to an open wound at the site unfortunately. He has been tolerating the dressing changes without complication in general as far as the wraps are concerned that he has not been putting any dressing on the left 1st toe ulcer site. 06/11/18 on evaluation today patient appears to be doing much worse in regard to his  bilateral lower extremity ulcers. He has been tolerating the dressing changes without complication although his legs have not been wrapped more recently. Overall I am not very pleased with the way his legs appear. I do believe he needs to be back in compression wraps he still has not received his compression wraps from the [facility] hospital as of yet. 06/18/18 on evaluation today patient actually appears to be doing significantly better than last time I saw him. He has been tolerating the compression wraps without complication in the circumferential ulcers especially appear to be doing much better. His toe ulcer on the right in regard to the great toe is better although not as good as the legs in my pinion. No fevers chills noted KWELI, GRASSEL ([id number]) 07/02/18 on evaluation  today patient appears to be doing much better in regard to his lower extremity ulcers. Unfortunately since I last saw him he's had the distal portion of his right great toe if he dated it sounds as if this actually went downhill very quickly. I had only seen him a few days prior and the toe did not appear to be infected at that point subsequently became infected very rapidly and it was decided by the surgeon that the distal portion of the toe needed to be removed. The patient seems to be doing well in this regard he tells me. With that being said his lower extremities are doing better from the standpoint of the wounds although he is significantly swollen at this point. Electronic Signature(s) Signed: 07/04/2018 1:01:02 AM By: Worthy Keeler PA-C Entered By: Worthy Keeler on 07/04/2018 00:54:42 Adam Merritt ([id number]) -------------------------------------------------------------------------------- Physical Exam Details Patient Name: Adam Merritt Date of Service: 07/02/2018 12:45 PM Medical Record Number: [id number] Patient Account Number: [id number] Date of Birth/Sex: 1946-01-23 (72 y.o. M) Treating RN: Montey Hora Primary Care Provider: [name] Other Clinician: Referring Provider: [name] Treating Provider/Extender: STONE III, [name]: 77 Constitutional Well-nourished and well-hydrated in no acute distress. Respiratory normal breathing without difficulty. clear to auscultation bilaterally. Cardiovascular regular rate and rhythm with normal S1, S2. Psychiatric this patient is able to make decisions and demonstrates good insight into disease process. Alert and Oriented x 3. pleasant and cooperative. Notes Patient's wound bed currently shows evidence of good granulation and epithelialization. Overall I feel like he has done excellent in regard to his ulcers on the legs does your form does appear to be helpful. Electronic Signature(s) Signed: 07/04/2018  1:01:02 AM By: Worthy Keeler PA-C Entered By: Worthy Keeler on 07/04/2018 00:55:11 Adam Merritt ([id number]) -------------------------------------------------------------------------------- Physician Orders Details Patient Name: Adam Merritt Date of Service: 07/02/2018 12:45 PM Medical Record Number: [id number] Patient Account Number: [id number] Date of Birth/Sex: 05-30-1946 (72 y.o. 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 o Cleanse wound with mild soap and water Wound #16 Left,Lateral Lower Leg o Clean wound with Normal Saline. o Cleanse wound with mild soap and water  Wound #17 Right,Lateral Lower Leg o Clean wound with Normal Saline. o Cleanse wound with mild soap and water Skin Barriers/Peri-Wound Care Wound #15 Left Lower Leg o Moisturizing lotion - to both lower legs Wound #16 Left,Lateral Lower Leg o Moisturizing lotion - to both lower legs Wound #17 Right,Lateral Lower Leg o Moisturizing lotion - to both lower legs Primary Wound Dressing Wound #15 Left Lower Leg o Xeroform Wound #16 Left,Lateral Lower Leg o Xeroform Wound #17 Right,Lateral Lower Leg o Xeroform Secondary Dressing JABES, PRIMO ([id number]) Wound #15 Left Lower Leg o Dry Gauze o ABD pad o XtraSorb Wound #16 Left,Lateral Lower Leg o Dry Gauze o  ABD pad o XtraSorb Wound #17 Right,Lateral Lower Leg o ABD pad o XtraSorb Dressing Change Frequency Wound #15 Left Lower Leg o Change Dressing Monday, Wednesday, Friday Wound #16 Left,Lateral Lower Leg o Change Dressing Monday, Wednesday, Friday Wound #17 Right,Lateral Lower Leg o Change Dressing Monday, Wednesday, Friday Follow-up Appointments Wound #14 Right Toe Great o Return Appointment in 1 week. Edema Control Wound #15 Left Lower Leg o 3 Layer Compression System - Bilateral Wound #16 Left,Lateral Lower Leg o 3 Layer Compression System - Bilateral Wound #17 Right,Lateral Lower Leg o 3 Layer Compression System - Bilateral Home Health Wound #15 Left Lower Leg o Continue Home Health Visits o Home Health Nurse may visit PRN to address patientos wound care needs. o FACE TO FACE ENCOUNTER: MEDICARE and MEDICAID PATIENTS: I certify that this patient is under my care and that I had a face-to-face encounter that meets the physician face-to-face encounter requirements with this patient on this date. The encounter with the patient was in whole or in part for the following MEDICAL CONDITION: (primary reason for [place]) MEDICAL NECESSITY: I certify, that based on my findings, NURSING services are a medically necessary home health service. HOME BOUND STATUS: I certify that my clinical findings support that this patient is homebound (i.e., Due to illness or injury, pt requires aid of supportive devices such as crutches, cane, wheelchairs, walkers, the use of special transportation or the assistance of another person to leave their place of residence. There is a normal inability to leave the home and doing so requires considerable and taxing effort. Other absences are for medical reasons / religious services and are infrequent or of short duration when for other reasons). o If current dressing causes regression in wound condition, may D/C ordered dressing  product/s and apply Normal Saline Moist Dressing daily until next [place] / Other MD appointment. [place] of regression in wound condition at [phone]. o Please direct any NON-WOUND related issues/requests for orders to patient's Primary Care Physician KASEM, MOZER ([id number]) o [place] Nurse may visit PRN to address patientos wound care needs. o FACE TO FACE ENCOUNTER: MEDICARE and MEDICAID PATIENTS: I certify that this patient is under my care and that I had a face-to-face encounter that meets the physician face-to-face encounter requirements with this patient on this date. The encounter with the patient was in whole or in part for the following MEDICAL CONDITION: (primary reason for [place]) MEDICAL NECESSITY: I certify, that based on my findings, NURSING services are a medically necessary home health service. HOME BOUND STATUS: I certify that my clinical findings support that this patient is homebound (i.e., Due to illness or injury, pt requires aid of supportive devices such as crutches, cane, wheelchairs, walkers, the use of special transportation or the assistance  of another person to leave their place of residence. There is a normal inability to leave the home and doing so requires considerable and taxing effort. Other absences are for medical reasons / religious services and are infrequent or of short duration when for other reasons). o If current dressing causes regression in wound condition, may D/C ordered dressing product/s and apply Normal Saline Moist Dressing daily until next [place] / Other MD appointment. [place] of regression in wound condition at [phone]. o Please direct any NON-WOUND related issues/requests for orders to patient's Primary Care Physician Wound #16 Left,Lateral Lower Leg o [place] Nurse may  visit PRN to address patientos wound care needs. o FACE TO FACE ENCOUNTER: MEDICARE and MEDICAID PATIENTS: I certify that this patient is under my care and that I had a face-to-face encounter that meets the physician face-to-face encounter requirements with this patient on this date. The encounter with the patient was in whole or in part for the following MEDICAL CONDITION: (primary reason for [place]) MEDICAL NECESSITY: I certify, that based on my findings, NURSING services are a medically necessary home health service. HOME BOUND STATUS: I certify that my clinical findings support that this patient is homebound (i.e., Due to illness or injury, pt requires aid of supportive devices such as crutches, cane, wheelchairs, walkers, the use of special transportation or the assistance of another person to leave their place of residence. There is a normal inability to leave the home and doing so requires considerable and taxing effort. Other absences are for medical reasons / religious services and are infrequent or of short duration when for other reasons). o If current dressing causes regression in wound condition, may D/C ordered dressing product/s and apply Normal Saline Moist Dressing daily until next [facility] / Other MD appointment. [place] of regression in wound condition at [phone]. o Please direct any NON-WOUND related issues/requests for orders to patient's [place] Nurse may visit PRN to address patientos wound care needs. o FACE TO FACE ENCOUNTER: MEDICARE and MEDICAID PATIENTS: I certify that this patient is under my care and that I had a face-to-face encounter that meets the physician face-to-face encounter requirements with this patient on this date. The encounter with the patient was in whole or in part for the following MEDICAL CONDITION: (primary reason for [place]) MEDICAL NECESSITY: I certify, that based on my findings, NURSING services are a medically necessary home health service. HOME BOUND STATUS: I certify that my clinical findings support that this patient is homebound (i.e., Due to illness or injury, pt requires aid of supportive devices such as crutches, cane, wheelchairs, walkers, the use of special transportation or the assistance of another person to leave their place of residence. There is a normal inability to leave the home and doing so requires considerable and taxing effort. Other absences are for medical reasons / religious services and are infrequent or of short duration when for other reasons). o If current dressing causes regression in wound condition, may D/C ordered dressing product/s and apply Normal Saline Moist Dressing daily until next [place] / Other MD appointment. [place] of regression in wound condition at [phone]. o Please direct any NON-WOUND related issues/requests for orders to patient's Primary Care Physician Wound #17 [place] Visits o Home Health Nurse may visit PRN to  address patientos wound care needs. o FACE TO FACE ENCOUNTER: MEDICARE and MEDICAID PATIENTS: I certify that this patient is under my care and that I had a face-to-face encounter that meets the physician face-to-face encounter requirements with this patient on this date. The encounter with the patient was in whole or in part for the following MEDICAL CONDITION: (primary reason for [place]) MEDICAL NECESSITY: I certify, that based on my findings, TYRESSE, JAYSON ([id number]) NURSING services are a medically necessary home health service. HOME BOUND STATUS: I certify that my clinical findings support that this patient is homebound (i.e., Due to illness or injury, pt requires aid of supportive devices such as crutches, cane, wheelchairs, walkers, the use  of special transportation or the assistance of another person to leave their place of residence. There is a normal inability to leave the home and doing so requires considerable and taxing effort. Other absences are for medical reasons / religious services and are infrequent or of short duration when for other reasons). o If current dressing causes regression in wound condition, may D/C ordered dressing product/s and apply Normal Saline Moist Dressing daily until next [place] / Other MD appointment. [place] of regression in wound condition at [phone]. o Please direct any NON-WOUND related issues/requests for orders to patient's [place] Nurse may visit PRN to address patientos wound care needs. o FACE TO FACE ENCOUNTER: MEDICARE and MEDICAID PATIENTS: I certify that this patient is under my care and that I had a face-to-face encounter that meets the physician face-to-face encounter requirements with this patient on this date. The encounter with the patient was in whole or in part for the following MEDICAL CONDITION: (primary reason for [place]) MEDICAL NECESSITY: I certify, that based on my findings, NURSING services are a medically necessary home health service. HOME BOUND STATUS: I certify that my clinical findings support that this patient is homebound (i.e., Due to illness or injury, pt requires aid of supportive devices such as crutches, cane, wheelchairs, walkers, the use of special transportation or the assistance of another person to leave their place of residence. There is a normal inability to leave the home and doing so requires considerable and taxing effort. Other absences are for medical reasons / religious services and are infrequent or of short duration when for other reasons). o If current dressing causes regression in wound condition, may D/C ordered dressing  product/s and apply Normal Saline Moist Dressing daily until next [place] / Other MD appointment. [place] of regression in wound condition at [phone]. o Please direct any NON-WOUND related issues/requests for orders to patient's Primary Care Physician Electronic Signature(s) Signed: 07/02/2018 5:16:49 PM By: Harold Barban Signed: 07/04/2018 1:01:02 AM By: Worthy Keeler PA-C Entered By: Harold Barban on 07/02/2018 13:43:52 Adam Merritt ([id number]) -------------------------------------------------------------------------------- Problem List Details Patient Name: Adam Merritt Date of Service: 07/02/2018 12:45 PM Medical Record Number: [id number] Patient Account Number: [id number] Date of Birth/Sex: 07/21/1945 (72 y.o. 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 layer 06/11/2018 No Yes exposed E66.9 Obesity, unspecified 04/09/2018 No Yes Inactive Problems Resolved Problems ICD-10 Code Description Active Date Resolved Date L03.115 Cellulitis of right lower limb 04/09/2018 04/09/2018 L03.116 Cellulitis of left lower limb 04/09/2018 04/09/2018 Electronic Signature(s) Signed: 07/04/2018 1:01:02 AM By: Worthy Keeler PA-C Entered By: Worthy Keeler on 07/02/2018 13:09:39 Adam Merritt, Adam Merritt ([id number]) Adam Merritt, Adam Merritt  ([id number]) -------------------------------------------------------------------------------- Progress Note Details Patient Name: Adam Merritt Date of Service: 07/02/2018 12:45 PM Medical Record Number: [id number] Patient Account Number: [id number] Date of Birth/Sex: 08-16-1945 (72 y.o. M) Treating RN: Montey Hora Primary Care Provider: [name] Other Clinician: Referring Provider: [name] Treating Provider/Extender: Melburn Hake, [name]: 12 Subjective Chief Complaint Information obtained from Patient he is here for evaluation of bilateral lower extremity lymphedema right right toe and left LE ulcers History of Present Illness (HPI) 10/18/17-He is here for initial evaluation of bilateral lower extremity ulcerations in the presence of venous insufficiency and lymphedema. He has been seen by vascular medicine in the past, Dr. Lucky Cowboy, last seen in 2016. He does have a history of abnormal ABIs, which is to be expected given his lymphedema and venous insufficiency. According to Epic, it appears that all attempts for arterial evaluation and/or angiography were not follow through with by patient. He does have a history of being seen in lymphedema clinic in 2018, stopped going approximately 6 months ago stating "it didn't do any good". He does not have lymphedema pumps, he does not have custom fit compression wrap/stockings. He is diabetic and his recent A1c last month was 7.6. He admits to chronic bilateral lower extremity pain, no change in pain since blister and ulceration development. He is currently being treated with Levaquin for bronchitis. He has home health and we will continue. 10/25/17-He is here in follow-up evaluation for bilateral lower extremity ulcerationssubtle he remains on Levaquin for bronchitis. Right lower extremity with no evidence of drainage or ulceration, persistent left lower extremity ulceration. He states that home health has not been out since his  appointment. He went to [facility] on Tuesday, studies revealed: RIGHT ABI 0.9, TBI 0.6 LEFT ABI 1.1, TBI 0.6 with triphasic flow bilaterally. We will continue his same treatment plan. He has been educated on compression therapy and need for elevation. He will benefit from lymphedema pumps 11/01/17-He is here in follow-up evaluation for left lower extremity ulcer. The right lower extremity remains healed. He has home health services, but they have not been out to see the patient for 2-3 weeks. He states it home health physical therapy changed his dressing yesterday after therapy; he placed Ace wrap compression. We are still waiting for lymphedema pumps, reordered d/t need for company change. 11/08/17-He is here in follow-up evaluation for left lower extremity ulcer. It is improved. Edema is significantly improved with compression therapy. We will continue with same treatment plan and he will follow-up next week. No word regarding lymphedema pumps 11/15/17-He is here in follow-up evaluation for left lower extremity ulcer. He is healed and will be discharged from wound care services. I have reached out to [facility] regarding his lymphedema pumps. They have been unable to reach the patient; the contact number they had with the patient's wife's cell phone and she has not answered any unrecognized calls. Contact should be made today, trial planned for next week; [facility] will continue to follow 11/27/17 on evaluation  today patient has multiple blistered areas over the right lower extremity his left lower extremity appears to be doing okay. These blistered areas show signs of no infection which is great news. With that being said he did have some necrotic skin overlying which was mechanically debrided away with saline and gauze today without complication. Overall post debridement the wounds appear to be doing better but in general his swelling seems to be increased. This  is obviously not good news. I think this is what has given rise to the blisters. 12/04/17 on evaluation today patient presents for follow-up concerning his bilateral lower extremity edema in the right lower extremity ulcers. He has been tolerating the dressing changes without complication. With that being said he has had no real issues with the wraps which is also good news. Overall I'm pleased with the progress he's been making. 12/11/17 on evaluation today patient appears to be doing rather well in regard to his right lateral lower extremity ulcer. He's been tolerating the dressing changes without complication. Fortunately there does not appear to be any evidence of infection at this time. Overall I'm pleased with the progress that is being made. Unfortunately he has been in the hospital due to having what sounds to be a stomach virus/flu fortunately that is starting to get better. 12/18/17 on evaluation today patient actually appears to be doing very well in regard to his bilateral lower extremities the RENWICK, ASMAN. ([id number]) swelling is under fairly good control his lymphedema pumps are still not up and running quite yet. With that being said he does have several areas of opening noted as far as wounds are concerned mainly over the left lower extremity. With that being said I do believe once he gets lymphedema pumps this would least help you mention some the fluid and preventing this from occurring. Hopefully that will be set up soon sleeves are Artie in place at his home he just waiting for the machine. 12/25/17 on evaluation today patient actually appears to be doing excellent in fact all of his ulcers appear to have resolved his legs appear very well. I do think he needs compression stockings we have discussed this and they are actually going to go to [place] today to elastic therapy to get this fitted for him. I think that is definitely a good thing to do. Readmission: 04/09/18 upon  evaluation today this patient is seen for readmission due to bilateral lower extremity lymphedema. He has significant swelling of his extremities especially on the left although the right is also swollen he has weeping from both sides. There are no obvious open wounds at this point. Fortunately he has been doing fairly well for quite a bit of time since I last saw him. Nonetheless unfortunately this seems to have reopened and is giving quite a bit of trouble. He states this began about a week ago when he first called [place] to get in to be seen. No fevers, chills, nausea, or vomiting noted at this time. He has not been using his lymphedema pumps due to the fact that they won't fit on his leg at this point likewise is also not been using his compression for essentially the same reason. 04/16/18 upon evaluation today patient actually appears to be doing a little better in regard to the fluid in his bilateral lower extremities. With that being said he's had three falls since I saw him last week. He also states that he's been feeling very poorly. I was concerned last week  and feel like that the concern is still there as far as the congestion in his chest is concerned he seems to be breathing about the same as last week but again he states he's very weak he's not even able to walk further than from the chair to the door. His wife had to buy a wheelchair just to be able to get them out of the house to get to the appointment today. This has me very concerned. 04/23/18 on evaluation today patient actually appears to be doing much better than last week's evaluation. At that time actually had to transport him to the ER via EMS and he subsequently was admitted for acute pulmonary edema, acute renal failure, and acute congestive heart failure. Fortunately he is doing much better. Apparently they did dialyze him and were able to take off roughly 35 pounds of fluid. Nonetheless he is feeling much better both in regard  to his breathing and he's able to get around much better at this time compared to previous. Overall I'm very happy with how things are at this time. There does not appear to be any evidence of infection currently. No fevers, chills, nausea, or vomiting noted at this time. 04/30/2018 patient seen today for follow-up and management of bilateral lower extremity lymphedema. He did express being more sad today than usual due to the recent loss of his dog. He states that he has been compliant with using the lymphedema pumps. However he does admit a minute over the last 2-3 days he has not been using the pumps due to the recent loss of his dog. At this time there is no drainage or open wounds to his lower extremities. The left leg edema is measuring smaller today. Still has a significant amount of edema on bilateral lower extremities With dry flaky skin. He will be referred to the lymphedema clinic for further management. Will continue 3 layer compression wraps and follow-up in 1-2 weeks.Denies any pain, fever, chairs recently. No recent falls or injuries reported during this visit. 05/07/18 on evaluation today patient actually appears to be doing very well in regard to his lower extremities in general all things considered. With that being said he is having some pain in the legs just due to the amount of swelling. He does have an area where he had a blister on the left lateral lower extremity this is open at this point other than that there's nothing else weeping at this time. 05/14/18 on evaluation today patient actually appears to be doing excellent all things considered in regard to his lower extremities. He still has a couple areas of weeping on each leg which has continued to be the issue for him. He does have an appointment with the lymphedema clinic although this isn't until February 2020. That was the earliest they had. In the meantime he has continued to tolerate the compression wraps without  complication. 05/28/18 on evaluation today patient actually appears to be doing more poorly in regard to his left lower extremity where he has a wound open at this point. He also had a fall where he subsequently injured his right great toe which has led to an open wound at the site unfortunately. He has been tolerating the dressing changes without complication in general as far as the wraps are concerned that he has not been putting any dressing on the left 1st toe ulcer site. 06/11/18 on evaluation today patient appears to be doing much worse in regard to his bilateral lower extremity  ulcers. He has been tolerating the dressing changes without complication although his legs have not been wrapped more recently. Overall I am not very pleased with the way his legs appear. I do believe he needs to be back in compression wraps he still has not received his compression wraps from the [facility] hospital as of yet. GAYLAN, FAUVER ([id number]) 06/18/18 on evaluation today patient actually appears to be doing significantly better than last time I saw him. He has been tolerating the compression wraps without complication in the circumferential ulcers especially appear to be doing much better. His toe ulcer on the right in regard to the great toe is better although not as good as the legs in my pinion. No fevers chills noted 07/02/18 on evaluation today patient appears to be doing much better in regard to his lower extremity ulcers. Unfortunately since I last saw him he's had the distal portion of his right great toe if he dated it sounds as if this actually went downhill very quickly. I had only seen him a few days prior and the toe did not appear to be infected at that point subsequently became infected very rapidly and it was decided by the surgeon that the distal portion of the toe needed to be removed. The patient seems to be doing well in this regard he tells me. With that being said his lower extremities are  doing better from the standpoint of the wounds although he is significantly swollen at this point. Patient History Information obtained from Patient. Family History Cancer - Paternal Grandparents, Kidney Disease - Siblings, Lung Disease - Mother, No family history of Diabetes, Heart Disease, Hereditary Spherocytosis, Hypertension, Seizures, Stroke, Thyroid Problems, Tuberculosis. Social History Never smoker, Marital Status - Married, Alcohol Use - Never, Drug Use - No History, Caffeine Use - Never. Medical History Eyes Denies history of Cataracts, Glaucoma, Optic Neuritis Ear/Nose/Mouth/Throat Denies history of Chronic sinus problems/congestion, Middle ear problems Hematologic/Lymphatic Patient has history of Anemia - aplastic anemia, Lymphedema Denies history of Hemophilia, Human Immunodeficiency Virus, Sickle Cell Disease Respiratory Patient has history of Chronic Obstructive Pulmonary Disease (COPD), Sleep Apnea Denies history of Aspiration, Asthma, Pneumothorax, Tuberculosis Cardiovascular Patient has history of Congestive Heart Failure, Coronary Artery Disease, Hypertension, Peripheral Venous Disease Denies history of Angina, Arrhythmia, Deep Vein Thrombosis, Hypotension, Myocardial Infarction, Peripheral Arterial Disease, Phlebitis, Vasculitis Gastrointestinal Denies history of Cirrhosis , Colitis, Crohn s, Hepatitis A, Hepatitis B, Hepatitis C Endocrine Patient has history of Type II Diabetes Denies history of Type I Diabetes Genitourinary Denies history of End Stage Renal Disease Immunological Denies history of Lupus Erythematosus, Raynaud s, Scleroderma Integumentary (Skin) Denies history of History of Burn, History of pressure wounds Musculoskeletal Patient has history of Osteoarthritis Denies history of Gout, Rheumatoid Arthritis, Osteomyelitis Neurologic Patient has history of Neuropathy Denies history of Dementia, Quadriplegia, Paraplegia, Seizure  Disorder Oncologic Patient has history of Received Chemotherapy - last dose 2006 Denies history of Received Radiation CAMMERON, GREIS ([id number]) Psychiatric Denies history of Anorexia/bulimia, Confinement Anxiety Hospitalization/Surgery History - 11/03/2013, [facility], bronchitis. Medical And Surgical History Notes Respiratory home O2 at night as needed Cardiovascular varicose veins Neurologic CVA - 1992 Review of Systems (ROS) Constitutional Symptoms (General Health) Denies complaints or symptoms of Fever, Chills, Marked Weight Change. Respiratory The patient has no complaints or symptoms. Cardiovascular Complains or has symptoms of LE edema. Psychiatric The patient has no complaints or symptoms. Objective Constitutional Well-nourished and well-hydrated in no acute distress. Vitals Time Taken: 12:46 PM, Height: 66 in, Weight:  323 lbs, BMI: 52.1, Temperature: 97.7 F, Pulse: 73 bpm, Respiratory Rate: 18 breaths/min, Blood Pressure: 143/76 mmHg. Respiratory normal breathing without difficulty. clear to auscultation bilaterally. Cardiovascular regular rate and rhythm with normal S1, S2. Psychiatric this patient is able to make decisions and demonstrates good insight into disease process. Alert and Oriented x 3. pleasant and cooperative. General Notes: Patient's wound bed currently shows evidence of good granulation and epithelialization. Overall I feel like he has done excellent in regard to his ulcers on the legs does your form does appear to be helpful. Integumentary (Hair, Skin) Wound #14 status is Open. Original cause of wound was Trauma. The wound is located on the Right Toe Great. The wound measures 0.9cm length x 1.4cm width x 0.2cm depth; 0.99cm^2 area and 0.198cm^3 volume. Wound #15 status is Open. Original cause of wound was Trauma. The wound is located on the Left Lower Leg. The wound MAICOL, BOWLAND. ([id number]) measures 3cm length x 25cm width x 0.1cm depth; 58.905cm^2  area and 5.89cm^3 volume. There is Fat Layer (Subcutaneous Tissue) Exposed exposed. There is no tunneling or undermining noted. There is a large amount of serosanguineous drainage noted. The wound margin is flat and intact. There is large (67-100%) red, hyper - granulation within the wound bed. There is a small (1-33%) amount of necrotic tissue within the wound bed including Adherent Slough. The periwound skin appearance did not exhibit: Callus, Crepitus, Excoriation, Induration, Rash, Scarring, Dry/Scaly, Maceration, Atrophie Blanche, Cyanosis, Ecchymosis, Hemosiderin Staining, Mottled, Pallor, Rubor, Erythema. The periwound has tenderness on palpation. Wound #16 status is Open. Original cause of wound was Blister. The wound is located on the Left,Lateral Lower Leg. The wound measures 0.1cm length x 0.1cm width x 0.1cm depth; 0.008cm^2 area and 0.001cm^3 volume. There is no tunneling or undermining noted. There is a large amount of serosanguineous drainage noted. The wound margin is flat and intact. There is large (67-100%) red granulation within the wound bed. There is a small (1-33%) amount of necrotic tissue within the wound bed including Adherent Slough. The periwound skin appearance did not exhibit: Callus, Crepitus, Excoriation, Induration, Rash, Scarring, Dry/Scaly, Maceration, Atrophie Blanche, Cyanosis, Ecchymosis, Hemosiderin Staining, Mottled, Pallor, Rubor, Erythema. The periwound has tenderness on palpation. Wound #17 status is Open. Original cause of wound was Blister. The wound is located on the Right,Lateral Lower Leg. The wound measures 5cm length x 1.4cm width x 0.1cm depth; 5.498cm^2 area and 0.55cm^3 volume. There is Fat Layer (Subcutaneous Tissue) Exposed exposed. There is no tunneling or undermining noted. There is a large amount of serosanguineous drainage noted. The wound margin is flat and intact. There is large (67-100%) red granulation within the wound bed. There is a  small (1-33%) amount of necrotic tissue within the wound bed including Adherent Slough. The periwound skin appearance did not exhibit: Callus, Crepitus, Excoriation, Induration, Rash, Scarring, Dry/Scaly, Maceration, Atrophie Blanche, Cyanosis, Ecchymosis, Hemosiderin Staining, Mottled, Pallor, Rubor, Erythema. The periwound has tenderness on palpation. Other Condition(s) Patient presents with Lymphedema located on the Bilateral Leg. The skin appearance exhibited: Hemosiderin Staining, Induration. The skin appearance did not exhibit: Atrophie Blanche, Callus, Crepitus, Cyanosis, Dry/Scaly, Ecchymosis, Erythema, Excoriation, Friable, Maceration, Mottled, Pallor, Rash, Rubor, Scarring. Skin temperature was noted as No Abnormality. 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 Cleanse wound with mild soap and water Wound #16 Left,Lateral Lower Leg: Clean wound with Normal Saline. LAURA, RADILLA ([id number]) Cleanse wound with mild soap and water Wound #17 Right,Lateral Lower Leg: Clean wound with Normal Saline. Cleanse wound with mild soap and water Skin Barriers/Peri-Wound Care: Wound #15 Left Lower Leg: Moisturizing lotion - to both lower legs Wound #16 Left,Lateral Lower Leg: Moisturizing lotion - to both lower legs Wound #17 Right,Lateral Lower Leg: Moisturizing lotion - to both lower legs Primary Wound Dressing: Wound #15 Left Lower Leg: Xeroform Wound #16 Left,Lateral Lower Leg: Xeroform Wound #17 Right,Lateral Lower Leg: Xeroform Secondary Dressing: Wound #15 Left Lower Leg: Dry Gauze ABD pad XtraSorb Wound #16  Left,Lateral Lower Leg: Dry Gauze ABD pad XtraSorb Wound #17 Right,Lateral Lower Leg: ABD pad XtraSorb Dressing Change Frequency: Wound #15 Left Lower Leg: Change Dressing Monday, Wednesday, Friday Wound #16 Left,Lateral Lower Leg: Change Dressing Monday, Wednesday, Friday Wound #17 Right,Lateral Lower Leg: Change Dressing Monday, Wednesday, Friday Follow-up Appointments: Wound #14 Right Toe Great: Return Appointment in 1 week. Edema Control: Wound #15 Left Lower Leg: 3 Layer Compression System - Bilateral Wound #16 Left,Lateral Lower Leg: 3 Layer Compression System - Bilateral Wound #17 Right,Lateral Lower Leg: 3 Layer Compression System - Bilateral Home Health: Wound #15 Left Lower Leg: [place] Nurse may visit PRN to address patient s wound care needs. FACE TO FACE ENCOUNTER: MEDICARE and MEDICAID PATIENTS: I certify that this patient is under my care and that I had a face-to-face encounter that meets the physician face-to-face encounter requirements with this patient on this date. The encounter with the patient was in whole or in part for the following MEDICAL CONDITION: (primary reason for [place]) MEDICAL NECESSITY: I certify, that based on my findings, NURSING services are a medically necessary home health service. HOME BOUND STATUS: I certify that my clinical findings support that this patient is homebound (i.e., Due to illness or injury, pt requires aid of supportive devices such as crutches, cane, wheelchairs, walkers, the use of special transportation or the assistance of another person to leave their place of residence. There is a normal inability to leave the home and doing so requires considerable and taxing effort. Other absences are for medical reasons / religious services and are infrequent or of short duration when for other reasons). XAIVER, ROSKELLEY ([id number]) If current dressing causes regression in wound condition, may  D/C ordered dressing product/s and apply Normal Saline Moist Dressing daily until next [place] / Other MD appointment. [place] of regression in wound condition at [phone]. Please direct any NON-WOUND related issues/requests for orders to patient's [place] Nurse may visit PRN to address patient s wound care needs. FACE TO FACE ENCOUNTER: MEDICARE and MEDICAID PATIENTS: I certify that this patient is under my care and that I had a face-to-face encounter that meets the physician face-to-face encounter requirements with this patient on this date. The encounter with the patient was in whole or in part for the following MEDICAL CONDITION: (primary reason for [place]) MEDICAL NECESSITY: I certify, that based on my findings, NURSING services are a medically necessary home health service. HOME BOUND STATUS: I certify that my clinical findings support that this patient is homebound (i.e., Due to illness or injury, pt requires aid of supportive devices such as crutches,  cane, wheelchairs, walkers, the use of special transportation or the assistance of another person to leave their place of residence. There is a normal inability to leave the home and doing so requires considerable and taxing effort. Other absences are for medical reasons / religious services and are infrequent or of short duration when for other reasons). If current dressing causes regression in wound condition, may D/C ordered dressing product/s and apply Normal Saline Moist Dressing daily until next [place] / Other MD appointment. [place] of regression in wound condition at [phone]. Please direct any NON-WOUND related issues/requests for orders to patient's Primary Care Physician Wound #16 Left,Lateral Lower Leg: [place] Nurse may visit PRN to address patient s wound  care needs. FACE TO FACE ENCOUNTER: MEDICARE and MEDICAID PATIENTS: I certify that this patient is under my care and that I had a face-to-face encounter that meets the physician face-to-face encounter requirements with this patient on this date. The encounter with the patient was in whole or in part for the following MEDICAL CONDITION: (primary reason for [place]) MEDICAL NECESSITY: I certify, that based on my findings, NURSING services are a medically necessary home health service. HOME BOUND STATUS: I certify that my clinical findings support that this patient is homebound (i.e., Due to illness or injury, pt requires aid of supportive devices such as crutches, cane, wheelchairs, walkers, the use of special transportation or the assistance of another person to leave their place of residence. There is a normal inability to leave the home and doing so requires considerable and taxing effort. Other absences are for medical reasons / religious services and are infrequent or of short duration when for other reasons). If current dressing causes regression in wound condition, may D/C ordered dressing product/s and apply Normal Saline Moist Dressing daily until next [place] / Other MD appointment. [place] of regression in wound condition at [phone]. Please direct any NON-WOUND related issues/requests for orders to patient's [place] Nurse may visit PRN to address patient s wound care needs. FACE TO FACE ENCOUNTER: MEDICARE and MEDICAID PATIENTS: I certify that this patient is under my care and that I had a face-to-face encounter that meets the physician face-to-face encounter requirements with this patient on this date. The encounter with the patient was in whole or in part for the following MEDICAL CONDITION: (primary reason for [place]) MEDICAL NECESSITY: I certify, that based on my findings,  NURSING services are a medically necessary home health service. HOME BOUND STATUS: I certify that my clinical findings support that this patient is homebound (i.e., Due to illness or injury, pt requires aid of supportive devices such as crutches, cane, wheelchairs, walkers, the use of special transportation or the assistance of another person to leave their place of residence. There is a normal inability to leave the home and doing so requires considerable and taxing effort. Other absences are for medical reasons / religious services and are infrequent or of short duration when for other reasons). If current dressing causes regression in wound condition, may D/C ordered dressing product/s and apply Normal Saline Moist Dressing daily until next [place] / Other MD appointment. [place] of regression in wound condition at [phone]. Please direct any NON-WOUND related issues/requests for orders to patient's Primary Care Physician Wound #17 Right,Lateral Lower Leg: [place] Nurse may visit PRN to address  patient s wound care needs. FACE TO FACE ENCOUNTER: MEDICARE and MEDICAID PATIENTS: I certify that this patient is under my care and that I had a face-to-face encounter that meets the physician face-to-face encounter requirements with this patient on this date. The encounter with the patient was in whole or in part for the following MEDICAL CONDITION: (primary reason for [place]) MEDICAL NECESSITY: I certify, that based on my findings, NURSING services are a medically necessary home health service. HOME BOUND STATUS: I certify that my clinical findings support that this patient is homebound (i.e., Due to illness or injury, pt requires aid of supportive devices such as crutches, cane, wheelchairs, walkers, the use of special transportation or the assistance of another person to leave their place of residence. There is a normal  inability to leave the [place], [place]. ([id number]) home and doing so requires considerable and taxing effort. Other absences are for medical reasons / religious services and are infrequent or of short duration when for other reasons). If current dressing causes regression in wound condition, may D/C ordered dressing product/s and apply Normal Saline Moist Dressing daily until next [place] / Other MD appointment. [place] of regression in wound condition at [phone]. Please direct any NON-WOUND related issues/requests for orders to patient's [place] Nurse may visit PRN to address patient s wound care needs. FACE TO FACE ENCOUNTER: MEDICARE and MEDICAID PATIENTS: I certify that this patient is under my care and that I had a face-to-face encounter that meets the physician face-to-face encounter requirements with this patient on this date. The encounter with the patient was in whole or in part for the following MEDICAL CONDITION: (primary reason for [place]) MEDICAL NECESSITY: I certify, that based on my findings, NURSING services are a medically necessary home health service. HOME BOUND STATUS: I certify that my clinical findings support that this patient is homebound (i.e., Due to illness or injury, pt requires aid of supportive devices such as crutches, cane, wheelchairs, walkers, the use of special transportation or the assistance of another person to leave their place of residence. There is a normal inability to leave the home and doing so requires considerable and taxing effort. Other absences are for medical reasons / religious services and are infrequent or of short duration when for other reasons). If current dressing causes regression in wound condition, may D/C ordered dressing product/s and apply Normal Saline Moist Dressing daily until next [place] / Other MD appointment.  [place] of regression in wound condition at [phone]. Please direct any NON-WOUND related issues/requests for orders to patient's Primary Care Physician My suggestion is gonna be that we continue with the Xeroform galls underneath the compression wrap for him although we need to ensure that home health knows to make sure the wrap is pulled up high enough to anchor over his calf and prevent it from sliding down. Other than this I'm hopeful that he will continue to show signs of improvement and get these legs healed once again. Please see above for specific wound care orders. We will see patient for re-evaluation in 1 week(s) here in the clinic. If anything worsens or changes patient will contact our office for additional recommendations. Electronic Signature(s) Signed: 07/04/2018 1:01:02 AM By: Worthy Keeler PA-C Entered By: Worthy Keeler on 07/04/2018 00:55:28 JAHLANI, LORENTZ ([id number]) -------------------------------------------------------------------------------- ROS/PFSH Details Patient Name: Adam Merritt Date of Service: 07/02/2018 12:45 PM  Medical Record Number: [id number] Patient Account Number: [id number] Date of Birth/Sex: 02-12-1946 (73 y.o. M) Treating RN: Montey Hora Primary Care Provider: [name] Other Clinician: Referring Provider: [name] Treating Provider/Extender: Melburn Hake, [name]: 12 Information Obtained From Patient Wound History Do you currently have one or more open woundso No Have you tested positive for osteomyelitis (bone infection)o No Have you had any tests for circulation on your legso Yes Constitutional Symptoms (General Health) Complaints and Symptoms: Negative for: Fever; Chills; Marked Weight Change Cardiovascular Complaints and Symptoms: Positive for: LE edema Medical History: Positive for: Congestive Heart Failure; Coronary Artery Disease; Hypertension; Peripheral Venous Disease Negative  for: Angina; Arrhythmia; Deep Vein Thrombosis; Hypotension; Myocardial Infarction; Peripheral Arterial Disease; Phlebitis; Vasculitis Past Medical History Notes: varicose veins Eyes Medical History: Negative for: Cataracts; Glaucoma; Optic Neuritis Ear/Nose/Mouth/Throat Medical History: Negative for: Chronic sinus problems/congestion; Middle ear problems Hematologic/Lymphatic Medical History: Positive for: Anemia - aplastic anemia; Lymphedema Negative for: Hemophilia; Human Immunodeficiency Virus; Sickle Cell Disease Respiratory Complaints and Symptoms: No Complaints or Symptoms Medical History: Positive for: Chronic Obstructive Pulmonary Disease (COPD); Sleep Apnea Negative for: Aspiration; Asthma; Pneumothorax; Tuberculosis Past Medical History Notes: HENRYK, URSIN ([id number]) home O2 at night as needed Gastrointestinal Medical History: Negative for: Cirrhosis ; Colitis; Crohnos; Hepatitis A; Hepatitis B; Hepatitis C Endocrine Medical History: Positive for: Type II Diabetes Negative for: Type I Diabetes Time with diabetes: since 2006 Treated with: Insulin Blood sugar tested every day: Yes Tested : Blood sugar testing results: Breakfast: 209 Genitourinary Medical History: Negative for: End Stage Renal Disease Immunological Medical History: Negative for: Lupus Erythematosus; Raynaudos; Scleroderma Integumentary (Skin) Medical History: Negative for: History of Burn; History of pressure wounds Musculoskeletal Medical History: Positive for: Osteoarthritis Negative for: Gout; Rheumatoid Arthritis; Osteomyelitis Neurologic Medical History: Positive for: Neuropathy Negative for: Dementia; Quadriplegia; Paraplegia; Seizure Disorder Past Medical History Notes: CVA - 1992 Oncologic Medical History: Positive for: Received Chemotherapy - last dose 2006 Negative for: Received Radiation Psychiatric Complaints and Symptoms: No Complaints or Symptoms Medical  History: CLEAVE, TERNES ([id number]) Negative for: Anorexia/bulimia; Confinement Anxiety Immunizations Pneumococcal Vaccine: Received Pneumococcal Vaccination: Yes Implantable Devices Hospitalization / Surgery History Name of Hospital Purpose of Hospitalization/[place] Date [facility] bronchitis 11/03/2013 Family and Social History Cancer: Yes - Paternal Grandparents; Diabetes: No; Heart Disease: No; Hereditary Spherocytosis: No; Hypertension: No; Kidney Disease: Yes - Siblings; Lung Disease: Yes - Mother; Seizures: No; Stroke: No; Thyroid Problems: No; Tuberculosis: No; Never smoker; Marital Status - Married; Alcohol Use: Never; Drug Use: No History; Caffeine Use: Never; Financial Concerns: No; Food, Clothing or Shelter Needs: No; Support System Lacking: No; Transportation Concerns: No; Advanced Directives: No; Patient does not want information on Advanced Directives Physician Affirmation I have reviewed and agree with the above information. Electronic Signature(s) Signed: 07/04/2018 1:01:02 AM By: Worthy Keeler PA-C Signed: 07/04/2018 5:03:44 PM By: Montey Hora Entered By: Worthy Keeler on 07/04/2018 00:55:00 Adam Merritt ([id number]) -------------------------------------------------------------------------------- SuperBill Details Patient Name: Adam Merritt Date of Service: 07/02/2018 Medical Record Number: [id number] Patient Account Number: [id number] Date of Birth/Sex: 10-04-1945 (72 y.o. 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 chronic ulcer of  skin of  other sites with fat layer exposed E66.9 Obesity, unspecified Facility Procedures CPT4 Code: [id number] Description: 99214 - WOUND CARE VISIT-LEV 4 EST PT Modifier: Quantity: 1 Physician Procedures CPT4 Code Description: [id number] 99214 - WC PHYS LEVEL 4 - EST PT ICD-10 Diagnosis Description L97.512 Non-pressure chronic ulcer of other part of right foot with fat I89.0 Lymphedema, not elsewhere classified L97.822 Non-pressure chronic ulcer of other  part of left lower leg with L98.492 Non-pressure chronic ulcer of skin of other sites with fat layer Modifier: layer exposed fat layer expos exposed Quantity: 1 ed Electronic Signature(s) Signed: 07/04/2018 1:01:02 AM By: Worthy Keeler PA-C Entered By: Worthy Keeler on 07/02/2018 23:54:43

## 2018-07-08 ENCOUNTER — Telehealth: Payer: Self-pay

## 2018-07-08 ENCOUNTER — Ambulatory Visit: Payer: Medicare Other | Admitting: Occupational Therapy

## 2018-07-08 NOTE — Telephone Encounter (Signed)
Per hospital discharge instructions, patient should discontinue hydralazine.

## 2018-07-09 ENCOUNTER — Encounter: Payer: Medicare Other | Attending: Physician Assistant | Admitting: Physician Assistant

## 2018-07-09 DIAGNOSIS — G4730 Sleep apnea, unspecified: Secondary | ICD-10-CM | POA: Insufficient documentation

## 2018-07-09 DIAGNOSIS — I110 Hypertensive heart disease with heart failure: Secondary | ICD-10-CM | POA: Diagnosis not present

## 2018-07-09 DIAGNOSIS — L97822 Non-pressure chronic ulcer of other part of left lower leg with fat layer exposed: Secondary | ICD-10-CM | POA: Diagnosis not present

## 2018-07-09 DIAGNOSIS — E11621 Type 2 diabetes mellitus with foot ulcer: Secondary | ICD-10-CM | POA: Insufficient documentation

## 2018-07-09 DIAGNOSIS — J449 Chronic obstructive pulmonary disease, unspecified: Secondary | ICD-10-CM | POA: Diagnosis not present

## 2018-07-09 DIAGNOSIS — E669 Obesity, unspecified: Secondary | ICD-10-CM | POA: Diagnosis not present

## 2018-07-09 DIAGNOSIS — I2510 Atherosclerotic heart disease of native coronary artery without angina pectoris: Secondary | ICD-10-CM | POA: Diagnosis not present

## 2018-07-09 DIAGNOSIS — I890 Lymphedema, not elsewhere classified: Secondary | ICD-10-CM | POA: Diagnosis not present

## 2018-07-09 DIAGNOSIS — E1151 Type 2 diabetes mellitus with diabetic peripheral angiopathy without gangrene: Secondary | ICD-10-CM | POA: Insufficient documentation

## 2018-07-09 DIAGNOSIS — I509 Heart failure, unspecified: Secondary | ICD-10-CM | POA: Diagnosis not present

## 2018-07-09 DIAGNOSIS — L97512 Non-pressure chronic ulcer of other part of right foot with fat layer exposed: Secondary | ICD-10-CM | POA: Diagnosis not present

## 2018-07-09 DIAGNOSIS — L98492 Non-pressure chronic ulcer of skin of other sites with fat layer exposed: Secondary | ICD-10-CM | POA: Diagnosis not present

## 2018-07-09 DIAGNOSIS — Z8673 Personal history of transient ischemic attack (TIA), and cerebral infarction without residual deficits: Secondary | ICD-10-CM | POA: Diagnosis not present

## 2018-07-09 DIAGNOSIS — M1990 Unspecified osteoarthritis, unspecified site: Secondary | ICD-10-CM | POA: Insufficient documentation

## 2018-07-09 DIAGNOSIS — G8929 Other chronic pain: Secondary | ICD-10-CM | POA: Diagnosis not present

## 2018-07-09 DIAGNOSIS — E1140 Type 2 diabetes mellitus with diabetic neuropathy, unspecified: Secondary | ICD-10-CM | POA: Diagnosis not present

## 2018-07-10 NOTE — Progress Notes (Signed)
[facility] [place], [place] [address]  Internal MEDICINE  Office Visit Note  Patient Name: Adam Merritt  [id number]  [id number]  Date of Service: 07/10/2018  Chief Complaint  Patient presents with  . Diabetes  . Hypertension  . Hyperlipidemia  . Hospitalization Follow-up    The patient is c/o increased pain in both legs. Currently on fentanyl 51mcg patches, changed every 3 days. Initially working very well, but now, pain in legs and feet is starting to get worse. He states that pain is about 8/10 in severity, can be as low as 5 or 6/10 in severity with the fentanyl patch. Would do better if pain was at level of 3/10 in severity. He needs to have refill for his fentanyl pain patch. 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 Both lower legs are currently wrapped in clean and dry dressings.       Current Medication: Outpatient Encounter Medications as of 07/01/2018  Medication Sig  . albuterol (PROVENTIL HFA;VENTOLIN HFA) 108 (90 Base) MCG/ACT inhaler INHALE 1 PUFF INTO THE LUNGS EVERY 6 HOURS AS NEEDED FOR WHEEZING OR SHORTNESS OF BREATH (Patient taking differently: Inhale 1 puff into the lungs every 6 (six) hours as needed for wheezing or shortness of breath. )  . aspirin 325 MG EC tablet Take 325 mg by mouth daily.  Marland Kitchen atorvastatin (LIPITOR) 40 MG tablet Take 40 mg by mouth at bedtime.   . carvedilol (COREG) 25 MG tablet Take 25 mg by mouth 2 (two) times daily with a meal.  . [EXPIRED] cefTRIAXone (ROCEPHIN) IVPB Inject 2 g into the vein daily for 12 days. Indication:  Group B streptococcus bacteremia Last Day of Therapy:  07/07/2018 Labs - Once weekly on Mondays:  CBC/D and CMP, Please pull PIC at completion of IV antibiotics  . docusate sodium (COLACE) 50 MG capsule Take 50 mg by mouth 2 (two) times daily.  Marland Kitchen ENSURE MAX PROTEIN (ENSURE MAX PROTEIN) LIQD Take 330 mLs (11 oz total) by mouth 2 (two)  times daily.  . febuxostat (ULORIC) 40 MG tablet Take 1 tablet (40 mg total) by mouth daily.  . fentaNYL (DURAGESIC) 50 MCG/HR Place 1 patch onto the skin every 3 (three) days.  . furosemide (LASIX) 40 MG tablet Take 1 tablet (40 mg total) by mouth daily.  Marland Kitchen gabapentin (NEURONTIN) 300 MG capsule Take 1 capsule po in AM and 1 capsule po in pm. Take 2 capsules po at bedtime.  Marland Kitchen HYDROcodone-acetaminophen (NORCO/VICODIN) 5-325 MG tablet Take 1 tablet by mouth every 6 (six) hours as needed for moderate pain or severe pain.  . indapamide (LOZOL) 2.5 MG tablet Take 1 tablet (2.5 mg total) by mouth daily.  . Insulin Glargine (BASAGLAR KWIKPEN) 100 UNIT/ML SOPN INJECT 30 TO 36 UNITS UNDER THE SKIN EVERY DAY (Patient taking differently: Inject 30-36 Units into the skin daily. )  . ipratropium-albuterol (DUONEB) 0.5-2.5 (3) MG/3ML SOLN Take 3 mLs by nebulization every 4 (four) hours as needed (for shortness of breath).  . liraglutide (VICTOZA) 18 MG/3ML SOPN Inject 0.3 mLs (1.8 mg total) into the skin daily.  . Multiple Vitamin (MULTIVITAMIN WITH MINERALS) TABS tablet Take 1 tablet by mouth daily.  Marland Kitchen omeprazole (PRILOSEC) 20 MG capsule Take 20 mg by mouth daily.  . pentoxifylline (TRENTAL) 400 MG CR tablet TAKE 1 TABLET BY MOUTH THREE TIMES DAILY FOR CLAUDICATION (Patient taking differently: Take 400 mg by mouth 3 (  three) times daily. )  . potassium chloride SA (K-DUR,KLOR-CON) 20 MEQ tablet Take 2 tablets (40 mEq total) by mouth 2 (two) times daily.  . sennosides-docusate sodium (SENOKOT-S) 8.6-50 MG tablet Take 1-2 tablets by mouth 2 (two) times daily.  Marland Kitchen zolpidem (AMBIEN) 5 MG tablet Take 1 tablet (5 mg total) by mouth at bedtime as needed for sleep.  . [DISCONTINUED] fentaNYL (DURAGESIC - DOSED MCG/HR) 50 MCG/HR Place 1 patch (50 mcg total) onto the skin every 3 (three) days.   No facility-administered encounter medications on file as of 07/01/2018.     Surgical History: Past Surgical History:   Procedure Laterality Date  . 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 CHOLECYSTECTOMY    . CORONARY ARTERY BYPASS GRAFT      Medical History: Past Medical History:  Diagnosis Date  . Anemia   . CAD (coronary artery disease)   . Chest pain   . Coronary artery disease   . Diabetes mellitus without complication ([place])   . Diastolic CHF ([place])   . Esophageal reflux   . Herpes zoster without mention of complication   . Hyperlipidemia   . Hypertension   . Insomnia   . Lumbago   . Lymphedema   . Neuropathy in diabetes ([place])   . Obstructive chronic bronchitis without exacerbation ([place])   . Other malaise and fatigue   . Stroke ([place])   . Varicose veins     Family History: Family History  Problem Relation Age of Onset  . Diabetes Mother   . Hyperlipidemia Mother   . Hypertension Mother   . Diabetes Father   . Hyperlipidemia Father   . Hypertension Father   . CAD Father     Social History   Socioeconomic History  . Marital status: Married    Spouse name: Not on file  . Number of children: Not on file  . Years of education: Not on file  . Highest education level: Not on file  Occupational History  . Not on file  Social Needs  . Financial resource strain: Not on file  . Food insecurity:    Worry: Not on file    Inability: Not on file  . Transportation needs:    Medical: Not on file    Non-medical: Not on file  Tobacco Use  . Smoking status: Never Smoker  . Smokeless tobacco: Never Used  Substance and Sexual Activity  . Alcohol use: No    Comment: quit alcohol 30 years ago  . Drug use: No  . Sexual activity: Not on file  Lifestyle  . Physical activity:    Days per week: Not on file    Minutes per session: Not on file  . Stress: Not on file  Relationships  . Social connections:    Talks on phone: Not on file    Gets together: Not on file     Attends religious service: Not on file    Active member of club or organization: Not on file    Attends meetings of clubs or organizations: Not on file    Relationship status: Not on file  . Intimate partner violence:    Fear of current or ex partner: Not on file    Emotionally abused: Not on file    Physically abused: Not on file    Forced sexual activity: Not on file  Other Topics Concern  .  Not on file  Social History Narrative   Lives at home with wife, uses a wheelchair now      Review of Systems  Constitutional: Positive for activity change and fatigue.  HENT: Negative for congestion and postnasal drip.   Respiratory: Positive for shortness of breath. Negative for cough, chest tightness and wheezing.   Cardiovascular: Positive for leg swelling. Negative for chest pain and palpitations.       Elevated blood pressure today.   Gastrointestinal: Negative for constipation, diarrhea, nausea and vomiting.  Endocrine: Negative for cold intolerance, heat intolerance, polydipsia and polyuria.       Blood sugars doing well   Musculoskeletal: Positive for arthralgias, back pain and myalgias.  Skin: Positive for rash and wound.       Skin of both lower legs is very red and warm. Reports that wounds are starting to drain clear fluid .  Allergic/Immunologic: Positive for environmental allergies.  Neurological: Negative for dizziness, light-headedness and headaches.  Hematological: Negative for adenopathy.  Psychiatric/Behavioral: Positive for dysphoric mood. Negative for agitation. The patient is nervous/anxious.     Today's Vitals   07/01/18 1449  BP: (!) 144/79  Pulse: 82  Resp: 16  SpO2: 97%  Weight: 297 lb (134.7 kg)  Height: 5\' 6"  (1.676 m)   Body mass index is 47.94 kg/m.  Physical Exam Vitals signs and nursing note reviewed.  Constitutional:      General: He is not in acute distress.    Appearance: He is well-developed. He is ill-appearing. He is not diaphoretic.   HENT:     Head: Normocephalic and atraumatic.     Nose: Nose normal.     Mouth/Throat:     Pharynx: No oropharyngeal exudate.  Eyes:     Conjunctiva/sclera: Conjunctivae normal.     Pupils: Pupils are equal, round, and reactive to light.  Neck:     Musculoskeletal: Normal range of motion and neck supple.     Thyroid: No thyromegaly.     Vascular: No JVD.     Trachea: No tracheal deviation.  Cardiovascular:     Rate and Rhythm: Normal rate and regular rhythm.     Heart sounds: Normal heart sounds. No murmur. No friction rub. No gallop.      Comments: Moderate swelling in bilateral lower legs. Both are very red and warm and very tender to palpate even lightly.  Pulmonary:     Effort: Pulmonary effort is normal. No respiratory distress.     Breath sounds: Normal breath sounds. No rales.  Chest:     Chest wall: No tenderness.  Abdominal:     General: Bowel sounds are normal.     Palpations: Abdomen is soft.     Tenderness: There is no abdominal tenderness.  Musculoskeletal: Normal range of motion.     Comments: Moderate to severe lower leg pain, bilaterally. Worse on the right side, especially after he "twisted it" this morning in the bathroom. Pain is severe from just above the heel and into the calf. Hurts to flex and dorsiflex the right foot. Strength is diminished. Having very difficult time putting any weight on the right foot.   Lymphadenopathy:     Cervical: No cervical adenopathy.  Skin:    General: Skin is warm and dry.  Neurological:     Mental Status: He is alert and oriented to person, place, and time. Mental status is at baseline.     Cranial Nerves: No cranial nerve deficit.  Psychiatric:  Behavior: Behavior normal.        Thought Content: Thought content normal.        Judgment: Judgment normal.    Assessment/Plan: 1. Essential hypertension Stable. Continue blood pressure medication as prescribed.   2. Chronic pain disorder Continue fentanyl 25mcg  patches. Change every three days. Reviewed side effects and possible risk factors of using narcotic pain medications.  - fentaNYL (DURAGESIC) 50 MCG/HR; Place 1 patch onto the skin every 3 (three) days.  Dispense: 10 patch; Refill: 0  3. Diabetic polyneuropathy associated with type 2 diabetes mellitus ([place]) Continue diabetic medication and gabapentin as prescribed   4. Peripheral vascular disease, unspecified ([place]) Continue visits with vein and vascular and wound center as scheduled.   5. OSA (obstructive sleep apnea) CPAP as scheduled.   General Counseling: Prashant verbalizes understanding of the findings of todays visit and agrees with plan of treatment. I have discussed any further diagnostic evaluation that may be needed or ordered today. We also reviewed his medications today. he has been encouraged to call the office with any questions or concerns that should arise related to todays visit.  Reviewed risks and possible side effects associated with taking opiates, benzodiazepines and other CNS depressants. Combination of these could cause dizziness and drowsiness. Advised patient not to drive or operate machinery when taking these medications, as patient's and other's life can be at risk and will have consequences. Patient verbalized understanding in this matter. Dependence and abuse for these drugs will be monitored closely. A Controlled substance policy and procedure is on file which allows [facility] to order a urine drug screen test at any visit. Patient understands and agrees with the plan  This patient was seen by [name] FNP Collaboration with Dr [name] as a part of collaborative care agreement  Meds ordered this encounter  Medications  . fentaNYL (DURAGESIC) 50 MCG/HR    Sig: Place 1 patch onto the skin every 3 (three) days.    Dispense:  10 patch    Refill:  0    Order Specific Question:   Supervising Provider    Answer:   [name] [[id number]]    Time  spent: 17 Minutes      Dr [name] Internal medicine

## 2018-07-10 NOTE — Progress Notes (Signed)
ALPER, GUILMETTE ([id number]) Visit Report for 07/09/2018 Chief Complaint Document Details Patient Name: Adam Merritt, Adam Merritt Date of Service: 07/09/2018 1:45 PM Medical Record Number: [id number] Patient Account Number: [id number] Date of Birth/Sex: 10-19-1945 (73 y.o. M) Treating RN: Montey Hora Primary Care Provider: [name] Other Clinician: Referring Provider: [name] Treating Provider/Extender: Melburn Hake, [name]: 13 Information Obtained from: Patient Chief Complaint he is here for evaluation of bilateral lower extremity lymphedema right right toe and left LE ulcers Electronic Signature(s) Signed: 07/09/2018 5:55:30 PM By: Worthy Keeler PA-C Entered By: Worthy Keeler on 07/09/2018 13:28:30 Adam Merritt ([id number]) -------------------------------------------------------------------------------- HPI Details Patient Name: Adam Merritt Date of Service: 07/09/2018 1:45 PM Medical Record Number: [id number] Patient Account Number: [id number] Date of Birth/Sex: 1946-01-10 (73 y.o. M) Treating RN: Montey Hora Primary Care Provider: [name] Other Clinician: Referring Provider: [name] Treating Provider/Extender: Melburn Hake, [name]: 13 History of Present Illness HPI Description: 10/18/17-He is here for initial evaluation of bilateral lower extremity ulcerations in the presence of venous insufficiency and lymphedema. He has been seen by vascular medicine in the past, Dr. Lucky Cowboy, last seen in 2016. He does have a history of abnormal ABIs, which is to be expected given his lymphedema and venous insufficiency. According to Epic, it appears that all attempts for arterial evaluation and/or angiography were not follow through with by patient. He does have a history of being seen in lymphedema clinic in 2018, stopped going approximately 6 months ago stating "it didn't do any good". He does not have lymphedema pumps, he does not have custom fit compression  wrap/stockings. He is diabetic and his recent A1c last month was 7.6. He admits to chronic bilateral lower extremity pain, no change in pain since blister and ulceration development. He is currently being treated with Levaquin for bronchitis. He has home health and we will continue. 10/25/17-He is here in follow-up evaluation for bilateral lower extremity ulcerationssubtle he remains on Levaquin for bronchitis. Right lower extremity with no evidence of drainage or ulceration, persistent left lower extremity ulceration. He states that home health has not been out since his appointment. He went to [facility] on Tuesday, studies revealed: RIGHT ABI 0.9, TBI 0.6 LEFT ABI 1.1, TBI 0.6 with triphasic flow bilaterally. We will continue his same treatment plan. He has been educated on compression therapy and need for elevation. He will benefit from lymphedema pumps 11/01/17-He is here in follow-up evaluation for left lower extremity ulcer. The right lower extremity remains healed. He has home health services, but they have not been out to see the patient for 2-3 weeks. He states it home health physical therapy changed his dressing yesterday after therapy; he placed Ace wrap compression. We are still waiting for lymphedema pumps, reordered d/t need for company change. 11/08/17-He is here in follow-up evaluation for left lower extremity ulcer. It is improved. Edema is significantly improved with compression therapy. We will continue with same treatment plan and he will follow-up next week. No word regarding lymphedema pumps 11/15/17-He is here in follow-up evaluation for left lower extremity ulcer. He is healed and will be discharged from wound care services. I have reached out to [facility] regarding his lymphedema pumps. They have been unable to reach the patient; the contact number they had with the patient's wife's cell phone and she has not answered any unrecognized calls. Contact  should be made today, trial planned for next week; [facility] will continue to  follow 11/27/17 on evaluation today patient has multiple blistered areas over the right lower extremity his left lower extremity appears to be doing okay. These blistered areas show signs of no infection which is great news. With that being said he did have some necrotic skin overlying which was mechanically debrided away with saline and gauze today without complication. Overall post debridement the wounds appear to be doing better but in general his swelling seems to be increased. This is obviously not good news. I think this is what has given rise to the blisters. 12/04/17 on evaluation today patient presents for follow-up concerning his bilateral lower extremity edema in the right lower extremity ulcers. He has been tolerating the dressing changes without complication. With that being said he has had no real issues with the wraps which is also good news. Overall I'm pleased with the progress he's been making. 12/11/17 on evaluation today patient appears to be doing rather well in regard to his right lateral lower extremity ulcer. He's been tolerating the dressing changes without complication. Fortunately there does not appear to be any evidence of infection at this time. Overall I'm pleased with the progress that is being made. Unfortunately he has been in the hospital due to having what sounds to be a stomach virus/flu fortunately that is starting to get better. 12/18/17 on evaluation today patient actually appears to be doing very well in regard to his bilateral lower extremities the swelling is under fairly good control his lymphedema pumps are still not up and running quite yet. With that being said he does have several areas of opening noted as far as wounds are concerned mainly over the left lower extremity. With that being said I do believe once he gets lymphedema pumps this would least help you mention some the  fluid and preventing this from occurring. Hopefully that will be set up soon sleeves are Artie in place at his home he just waiting for the machine. 12/25/17 on evaluation today patient actually appears to be doing excellent in fact all of his ulcers appear to have resolved his AVIV, LENGACHER. ([id number]) legs appear very well. I do think he needs compression stockings we have discussed this and they are actually going to go to [place] today to elastic therapy to get this fitted for him. I think that is definitely a good thing to do. Readmission: 04/09/18 upon evaluation today this patient is seen for readmission due to bilateral lower extremity lymphedema. He has significant swelling of his extremities especially on the left although the right is also swollen he has weeping from both sides. There are no obvious open wounds at this point. Fortunately he has been doing fairly well for quite a bit of time since I last saw him. Nonetheless unfortunately this seems to have reopened and is giving quite a bit of trouble. He states this began about a week ago when he first called [place] to get in to be seen. No fevers, chills, nausea, or vomiting noted at this time. He has not been using his lymphedema pumps due to the fact that they won't fit on his leg at this point likewise is also not been using his compression for essentially the same reason. 04/16/18 upon evaluation today patient actually appears to be doing a little better in regard to the fluid in his bilateral lower extremities. With that being said he's had three falls since I saw him last week. He also states that he's been feeling very poorly. I was  concerned last week and feel like that the concern is still there as far as the congestion in his chest is concerned he seems to be breathing about the same as last week but again he states he's very weak he's not even able to walk further than from the chair to the door. His wife had to buy a  wheelchair just to be able to get them out of the house to get to the appointment today. This has me very concerned. 04/23/18 on evaluation today patient actually appears to be doing much better than last week's evaluation. At that time actually had to transport him to the ER via EMS and he subsequently was admitted for acute pulmonary edema, acute renal failure, and acute congestive heart failure. Fortunately he is doing much better. Apparently they did dialyze him and were able to take off roughly 35 pounds of fluid. Nonetheless he is feeling much better both in regard to his breathing and he's able to get around much better at this time compared to previous. Overall I'm very happy with how things are at this time. There does not appear to be any evidence of infection currently. No fevers, chills, nausea, or vomiting noted at this time. 04/30/2018 patient seen today for follow-up and management of bilateral lower extremity lymphedema. He did express being more sad today than usual due to the recent loss of his dog. He states that he has been compliant with using the lymphedema pumps. However he does admit a minute over the last 2-3 days he has not been using the pumps due to the recent loss of his dog. At this time there is no drainage or open wounds to his lower extremities. The left leg edema is measuring smaller today. Still has a significant amount of edema on bilateral lower extremities With dry flaky skin. He will be referred to the lymphedema clinic for further management. Will continue 3 layer compression wraps and follow-up in 1-2 weeks.Denies any pain, fever, chairs recently. No recent falls or injuries reported during this visit. 05/07/18 on evaluation today patient actually appears to be doing very well in regard to his lower extremities in general all things considered. With that being said he is having some pain in the legs just due to the amount of swelling. He does have an area  where he had a blister on the left lateral lower extremity this is open at this point other than that there's nothing else weeping at this time. 05/14/18 on evaluation today patient actually appears to be doing excellent all things considered in regard to his lower extremities. He still has a couple areas of weeping on each leg which has continued to be the issue for him. He does have an appointment with the lymphedema clinic although this isn't until February 2020. That was the earliest they had. In the meantime he has continued to tolerate the compression wraps without complication. 05/28/18 on evaluation today patient actually appears to be doing more poorly in regard to his left lower extremity where he has a wound open at this point. He also had a fall where he subsequently injured his right great toe which has led to an open wound at the site unfortunately. He has been tolerating the dressing changes without complication in general as far as the wraps are concerned that he has not been putting any dressing on the left 1st toe ulcer site. 06/11/18 on evaluation today patient appears to be doing much worse in regard to his  bilateral lower extremity ulcers. He has been tolerating the dressing changes without complication although his legs have not been wrapped more recently. Overall I am not very pleased with the way his legs appear. I do believe he needs to be back in compression wraps he still has not received his compression wraps from the [facility] hospital as of yet. 06/18/18 on evaluation today patient actually appears to be doing significantly better than last time I saw him. He has been tolerating the compression wraps without complication in the circumferential ulcers especially appear to be doing much better. His toe ulcer on the right in regard to the great toe is better although not as good as the legs in my pinion. No fevers chills noted TEDRIC, LEETH ([id number]) 07/02/18 on evaluation  today patient appears to be doing much better in regard to his lower extremity ulcers. Unfortunately since I last saw him he's had the distal portion of his right great toe if he dated it sounds as if this actually went downhill very quickly. I had only seen him a few days prior and the toe did not appear to be infected at that point subsequently became infected very rapidly and it was decided by the surgeon that the distal portion of the toe needed to be removed. The patient seems to be doing well in this regard he tells me. With that being said his lower extremities are doing better from the standpoint of the wounds although he is significantly swollen at this point. 07/09/18 on evaluation today patient appears to be doing better in regard to the wounds on his lower extremities. In fact everything is almost completely healed he is just a small area on the left posterior lower extremity that is open at this point. 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 With that being said he needs some kind of compression it does sound like he may not be wearing his compression, that is the wraps, during the entire time between when he's here visit to visit. Apparently his wife took the current one off because it began to "fall apart". Electronic Signature(s) Signed: 07/09/2018 5:55:30 PM By: Worthy Keeler PA-C Entered By: Worthy Keeler on 07/09/2018 14:24:16 Adam Merritt ([id number]) -------------------------------------------------------------------------------- Physical Exam Details Patient Name: Adam Merritt Date of Service: 07/09/2018 1:45 PM Medical Record Number: [id number] Patient Account Number: [id number] Date of Birth/Sex: 02-Aug-1945 (73 y.o. M) Treating RN: Montey Hora Primary Care Provider: [name]  Other Clinician: Referring Provider: [name] Treating Provider/Extender: STONE III, [name]: 62 Constitutional Well-nourished and well-hydrated in no acute distress. Respiratory normal breathing without difficulty. clear to auscultation bilaterally. Cardiovascular regular rate and rhythm with normal S1, S2. Psychiatric this patient is able to make decisions and demonstrates good insight into disease process. Alert and Oriented x 3. pleasant and cooperative. Notes Patient is just a very small wound on the back of the posterior calf region on the left that is open and training this appears to be a blister type region. There are no specific wounds at this time his swelling is definitely up again were unsure of exactly how long the wrap has been off at this point. Nonetheless he does not have the Velcro reduction wrap as of yet. Electronic Signature(s) Signed: 07/09/2018 5:55:30 PM By: Worthy Keeler PA-C  Entered By: Worthy Keeler on 07/09/2018 14:25:00 Adam Merritt ([id number]) -------------------------------------------------------------------------------- Physician Orders Details Patient Name: Adam Merritt Date of Service: 07/09/2018 1:45 PM Medical Record Number: [id number] Patient Account Number: [id number] Date of Birth/Sex: 1946-04-18 (73 y.o. M) Treating RN: Montey Hora Primary Care Provider: [name] Other Clinician: Referring Provider: [name] Treating Provider/Extender: Melburn Hake, [name]: 67 Verbal / Phone Orders: No Diagnosis Coding ICD-10 Coding Code Description I89.0 Lymphedema, not elsewhere classified L97.512 Non-pressure chronic ulcer of other part of right foot with fat layer exposed L97.822 Non-pressure chronic ulcer of other part of left lower leg with fat layer exposed L98.492 Non-pressure chronic ulcer of skin of other sites with fat layer exposed E66.9 Obesity, unspecified Wound Cleansing o Cleanse wound  with mild soap and water Skin Barriers/Peri-Wound Care o Moisturizing lotion - to both lower legs Secondary Dressing o ABD pad - to any areas of draining lymphedema Dressing Change Frequency o Change Dressing Monday, Wednesday, Friday Edema Control o 3 Layer Compression System - Bilateral - patient has draining lymphedema and requires compression wraps for this - he has velcro compression wraps ordered through the [place] that should arrive in about 1 week [place] Nurse may visit PRN to address patientos wound care needs. o Home Health Nurse may visit PRN to address patientos wound care needs. o FACE TO FACE ENCOUNTER: MEDICARE and MEDICAID PATIENTS: I certify that this patient is under my care and that I had a face-to-face encounter that meets the physician face-to-face encounter requirements with this patient on this date. The encounter with the patient was in whole or in part for the following MEDICAL CONDITION: (primary reason for [place]) MEDICAL NECESSITY: I certify, that based on my findings, NURSING services are a medically necessary home health service. HOME BOUND STATUS: I certify that my clinical findings support that this patient is homebound (i.e., Due to illness or injury, pt requires aid of supportive devices such as crutches, cane, wheelchairs, walkers, the use of special transportation or the assistance of another person to leave their place of residence. There is a normal inability to leave the home and doing so requires considerable and taxing effort. Other absences are for medical reasons / religious services and are infrequent or of short duration when for other reasons). o FACE TO FACE ENCOUNTER: MEDICARE and MEDICAID PATIENTS: I certify that this patient is under my care and that I had a face-to-face encounter that meets the physician face-to-face encounter  requirements with this patient on this date. The encounter with the patient was in whole or in part for the following MEDICAL CONDITION: (primary reason for [place]) MEDICAL NECESSITY: I certify, that based on my findings, NURSING services are a medically necessary home health service. HOME BOUND STATUS: I certify that my clinical findings support that this patient is homebound (i.e., Due to illness or injury, pt requires aid of supportive devices such as crutches, cane, wheelchairs, walkers, the use of special transportation or the ANSH, FAUBLE. ([id number]) assistance of another person to leave their place of residence. There is a normal inability to leave the home and doing so requires considerable and taxing effort. Other absences are for medical reasons / religious services and are infrequent or of short duration when for other reasons). o If current dressing causes regression in wound condition, may D/C ordered dressing product/s and apply Normal Saline Moist Dressing daily until  next [place] / Other MD appointment. [place] of regression in wound condition at [phone]. o If current dressing causes regression in wound condition, may D/C ordered dressing product/s and apply Normal Saline Moist Dressing daily until next [place] / Other MD appointment. [place] of regression in wound condition at [phone]. o Please direct any NON-WOUND related issues/requests for orders to patient's Primary Care Physician o Please direct any NON-WOUND related issues/requests for orders to patient's Primary Care Physician Electronic Signature(s) Signed: 07/09/2018 4:52:45 PM By: Montey Hora Signed: 07/09/2018 5:55:30 PM By: Worthy Keeler PA-C Entered By: Montey Hora on 07/09/2018 14:18:42 Adam Merritt ([id number]) -------------------------------------------------------------------------------- Problem List  Details Patient Name: Adam Merritt Date of Service: 07/09/2018 1:45 PM Medical Record Number: [id number] Patient Account Number: [id number] Date of Birth/Sex: [birth year] (73 y.o. M) Treating RN: Montey Hora Primary Care Provider: [name] Other Clinician: Referring Provider: [name] Treating Provider/Extender: Melburn Hake, [name]: 72 Active Problems ICD-10 Evaluated Encounter Code Description Active Date Today Diagnosis I89.0 Lymphedema, not elsewhere classified 04/09/2018 No Yes L97.512 Non-pressure chronic ulcer of other part of right foot with fat 05/28/2018 No Yes layer exposed L97.822 Non-pressure chronic ulcer of other part of left lower leg with 05/28/2018 No Yes fat layer exposed L98.492 Non-pressure chronic ulcer of skin of other sites with fat layer 06/11/2018 No Yes exposed E66.9 Obesity, unspecified 04/09/2018 No Yes Inactive Problems Resolved Problems ICD-10 Code Description Active Date Resolved Date L03.115 Cellulitis of right lower limb 04/09/2018 04/09/2018 L03.116 Cellulitis of left lower limb 04/09/2018 04/09/2018 Electronic Signature(s) Signed: 07/09/2018 5:55:30 PM By: Worthy Keeler PA-C Entered By: Worthy Keeler on 07/09/2018 13:28:25 Adam Merritt, Adam Merritt ([id number]) Adam Merritt, Adam Merritt ([id number]) -------------------------------------------------------------------------------- Progress Note Details Patient Name: Adam Merritt Date of Service: 07/09/2018 1:45 PM Medical Record Number: [id number] Patient Account Number: [id number] Date of Birth/Sex: Jan 07, 1946 (73 y.o. M) Treating RN: Montey Hora Primary Care Provider: [name] Other Clinician: Referring Provider: [name] Treating Provider/Extender: Melburn Hake, [name]: 13 Subjective Chief Complaint Information obtained from Patient he is here for evaluation of bilateral lower extremity lymphedema right right toe and left LE ulcers History of Present Illness  (HPI) 10/18/17-He is here for initial evaluation of bilateral lower extremity ulcerations in the presence of venous insufficiency and lymphedema. He has been seen by vascular medicine in the past, Dr. Lucky Cowboy, last seen in 2016. He does have a history of abnormal ABIs, which is to be expected given his lymphedema and venous insufficiency. According to Epic, it appears that all attempts for arterial evaluation and/or angiography were not follow through with by patient. He does have a history of being seen in lymphedema clinic in 2018, stopped going approximately 6 months ago stating "it didn't do any good". He does not have lymphedema pumps, he does not have custom fit compression wrap/stockings. He is diabetic and his recent A1c last month was 7.6. He admits to chronic bilateral lower extremity pain, no change in pain since blister and ulceration development. He is currently being treated with Levaquin for bronchitis. He has home health and we will continue. 10/25/17-He is here in follow-up evaluation for bilateral lower extremity ulcerationssubtle he remains on Levaquin for bronchitis. Right lower extremity with no evidence of drainage or ulceration, persistent left lower extremity ulceration. He states that home health has not been out since his appointment. He went to [facility] on Tuesday, studies revealed: RIGHT  ABI 0.9, TBI 0.6 LEFT ABI 1.1, TBI 0.6 with triphasic flow bilaterally. We will continue his same treatment plan. He has been educated on compression therapy and need for elevation. He will benefit from lymphedema pumps 11/01/17-He is here in follow-up evaluation for left lower extremity ulcer. The right lower extremity remains healed. He has home health services, but they have not been out to see the patient for 2-3 weeks. He states it home health physical therapy changed his dressing yesterday after therapy; he placed Ace wrap compression. We are still waiting for lymphedema  pumps, reordered d/t need for company change. 11/08/17-He is here in follow-up evaluation for left lower extremity ulcer. It is improved. Edema is significantly improved with compression therapy. We will continue with same treatment plan and he will follow-up next week. No word regarding lymphedema pumps 11/15/17-He is here in follow-up evaluation for left lower extremity ulcer. He is healed and will be discharged from wound care services. I have reached out to [facility] regarding his lymphedema pumps. They have been unable to reach the patient; the contact number they had with the patient's wife's cell phone and she has not answered any unrecognized calls. Contact should be made today, trial planned for next week; [facility] will continue to follow 11/27/17 on evaluation today patient has multiple blistered areas over the right lower extremity his left lower extremity appears to be doing okay. These blistered areas show signs of no infection which is great news. With that being said he did have some necrotic skin overlying which was mechanically debrided away with saline and gauze today without complication. Overall post debridement the wounds appear to be doing better but in general his swelling seems to be increased. This is obviously not good news. I think this is what has given rise to the blisters. 12/04/17 on evaluation today patient presents for follow-up concerning his bilateral lower extremity edema in the right lower extremity ulcers. He has been tolerating the dressing changes without complication. With that being said he has had no real issues with the wraps which is also good news. Overall I'm pleased with the progress he's been making. 12/11/17 on evaluation today patient appears to be doing rather well in regard to his right lateral lower extremity ulcer. He's been tolerating the dressing changes without complication. Fortunately there does not appear to be any evidence of  infection at this time. Overall I'm pleased with the progress that is being made. Unfortunately he has been in the hospital due to having what sounds to be a stomach virus/flu fortunately that is starting to get better. 12/18/17 on evaluation today patient actually appears to be doing very well in regard to his bilateral lower extremities the Adam Merritt, Adam Merritt. ([id number]) swelling is under fairly good control his lymphedema pumps are still not up and running quite yet. With that being said he does have several areas of opening noted as far as wounds are concerned mainly over the left lower extremity. With that being said I do believe once he gets lymphedema pumps this would least help you mention some the fluid and preventing this from occurring. Hopefully that will be set up soon sleeves are Artie in place at his home he just waiting for the machine. 12/25/17 on evaluation today patient actually appears to be doing excellent in fact all of his ulcers appear to have resolved his legs appear very well. I do think he needs compression stockings we have discussed this and they are actually going  to go to [place] today to elastic therapy to get this fitted for him. I think that is definitely a good thing to do. Readmission: 04/09/18 upon evaluation today this patient is seen for readmission due to bilateral lower extremity lymphedema. He has significant swelling of his extremities especially on the left although the right is also swollen he has weeping from both sides. There are no obvious open wounds at this point. Fortunately he has been doing fairly well for quite a bit of time since I last saw him. Nonetheless unfortunately this seems to have reopened and is giving quite a bit of trouble. He states this began about a week ago when he first called [place] to get in to be seen. No fevers, chills, nausea, or vomiting noted at this time. He has not been using his lymphedema pumps due to the fact that they won't  fit on his leg at this point likewise is also not been using his compression for essentially the same reason. 04/16/18 upon evaluation today patient actually appears to be doing a little better in regard to the fluid in his bilateral lower extremities. With that being said he's had three falls since I saw him last week. He also states that he's been feeling very poorly. I was concerned last week and feel like that the concern is still there as far as the congestion in his chest is concerned he seems to be breathing about the same as last week but again he states he's very weak he's not even able to walk further than from the chair to the door. His wife had to buy a wheelchair just to be able to get them out of the house to get to the appointment today. This has me very concerned. 04/23/18 on evaluation today patient actually appears to be doing much better than last week's evaluation. At that time actually had to transport him to the ER via EMS and he subsequently was admitted for acute pulmonary edema, acute renal failure, and acute congestive heart failure. Fortunately he is doing much better. Apparently they did dialyze him and were able to take off roughly 35 pounds of fluid. Nonetheless he is feeling much better both in regard to his breathing and he's able to get around much better at this time compared to previous. Overall I'm very happy with how things are at this time. There does not appear to be any evidence of infection currently. No fevers, chills, nausea, or vomiting noted at this time. 04/30/2018 patient seen today for follow-up and management of bilateral lower extremity lymphedema. He did express being more sad today than usual due to the recent loss of his dog. He states that he has been compliant with using the lymphedema pumps. However he does admit a minute over the last 2-3 days he has not been using the pumps due to the recent loss of his dog. At this time there is no drainage  or open wounds to his lower extremities. The left leg edema is measuring smaller today. Still has a significant amount of edema on bilateral lower extremities With dry flaky skin. He will be referred to the lymphedema clinic for further management. Will continue 3 layer compression wraps and follow-up in 1-2 weeks.Denies any pain, fever, chairs recently. No recent falls or injuries reported during this visit. 05/07/18 on evaluation today patient actually appears to be doing very well in regard to his lower extremities in general all things considered. With that being said he is having  some pain in the legs just due to the amount of swelling. He does have an area where he had a blister on the left lateral lower extremity this is open at this point other than that there's nothing else weeping at this time. 05/14/18 on evaluation today patient actually appears to be doing excellent all things considered in regard to his lower extremities. He still has a couple areas of weeping on each leg which has continued to be the issue for him. He does have an appointment with the lymphedema clinic although this isn't until February 2020. That was the earliest they had. In the meantime he has continued to tolerate the compression wraps without complication. 05/28/18 on evaluation today patient actually appears to be doing more poorly in regard to his left lower extremity where he has a wound open at this point. He also had a fall where he subsequently injured his right great toe which has led to an open wound at the site unfortunately. He has been tolerating the dressing changes without complication in general as far as the wraps are concerned that he has not been putting any dressing on the left 1st toe ulcer site. 06/11/18 on evaluation today patient appears to be doing much worse in regard to his bilateral lower extremity ulcers. He has been tolerating the dressing changes without complication although his legs  have not been wrapped more recently. Overall I am not very pleased with the way his legs appear. I do believe he needs to be back in compression wraps he still has not received his compression wraps from the [facility] hospital as of yet. Adam Merritt, Adam Merritt ([id number]) 06/18/18 on evaluation today patient actually appears to be doing significantly better than last time I saw him. He has been tolerating the compression wraps without complication in the circumferential ulcers especially appear to be doing much better. His toe ulcer on the right in regard to the great toe is better although not as good as the legs in my pinion. No fevers chills noted 07/02/18 on evaluation today patient appears to be doing much better in regard to his lower extremity ulcers. Unfortunately since I last saw him he's had the distal portion of his right great toe if he dated it sounds as if this actually went downhill very quickly. I had only seen him a few days prior and the toe did not appear to be infected at that point subsequently became infected very rapidly and it was decided by the surgeon that the distal portion of the toe needed to be removed. The patient seems to be doing well in this regard he tells me. With that being said his lower extremities are doing better from the standpoint of the wounds although he is significantly swollen at this point. 07/09/18 on evaluation today patient appears to be doing better in regard to the wounds on his lower extremities. In fact everything is almost completely healed he is just a small area on the left posterior lower extremity that is open at this point. 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 With that being said he needs some kind of compression it does sound like he may not be wearing his  compression, that is the wraps, during the entire time between when he's here visit to visit. Apparently his wife took the  current one off because it began to "fall apart". Patient History Information obtained from Patient. Family History Cancer - Paternal Grandparents, Kidney Disease - Siblings, Lung Disease - Mother, No family history of Diabetes, Heart Disease, Hereditary Spherocytosis, Hypertension, Seizures, Stroke, Thyroid Problems, Tuberculosis. Social History Never smoker, Marital Status - Married, Alcohol Use - Never, Drug Use - No History, Caffeine Use - Never. Medical History Eyes Denies history of Cataracts, Glaucoma, Optic Neuritis Ear/Nose/Mouth/Throat Denies history of Chronic sinus problems/congestion, Middle ear problems Hematologic/Lymphatic Patient has history of Anemia - aplastic anemia, Lymphedema Denies history of Hemophilia, Human Immunodeficiency Virus, Sickle Cell Disease Respiratory Patient has history of Chronic Obstructive Pulmonary Disease (COPD), Sleep Apnea Denies history of Aspiration, Asthma, Pneumothorax, Tuberculosis Cardiovascular Patient has history of Congestive Heart Failure, Coronary Artery Disease, Hypertension, Peripheral Venous Disease Denies history of Angina, Arrhythmia, Deep Vein Thrombosis, Hypotension, Myocardial Infarction, Peripheral Arterial Disease, Phlebitis, Vasculitis Gastrointestinal Denies history of Cirrhosis , Colitis, Crohn s, Hepatitis A, Hepatitis B, Hepatitis C Endocrine Patient has history of Type II Diabetes Denies history of Type I Diabetes Genitourinary Denies history of End Stage Renal Disease Immunological Denies history of Lupus Erythematosus, Raynaud s, Scleroderma Integumentary (Skin) Denies history of History of Burn, History of pressure wounds Musculoskeletal Patient has history of Osteoarthritis Adam Merritt, Adam Merritt ([id number]) Denies history of Gout, Rheumatoid Arthritis, Osteomyelitis Neurologic Patient  has history of Neuropathy Denies history of Dementia, Quadriplegia, Paraplegia, Seizure Disorder Oncologic Patient has history of Received Chemotherapy - last dose 2006 Denies history of Received Radiation Psychiatric Denies history of Anorexia/bulimia, Confinement Anxiety Hospitalization/Surgery History - 11/03/2013, [facility], bronchitis. Medical And Surgical History Notes Respiratory home O2 at night as needed Cardiovascular varicose veins Neurologic CVA - 1992 Review of Systems (ROS) Constitutional Symptoms (General Health) Denies complaints or symptoms of Fever, Chills, Marked Weight Change. Respiratory The patient has no complaints or symptoms. Cardiovascular Complains or has symptoms of LE edema. Psychiatric The patient has no complaints or symptoms. Objective Constitutional Well-nourished and well-hydrated in no acute distress. Vitals Time Taken: 1:44 PM, Height: 66 in, Weight: 323 lbs, BMI: 52.1, Temperature: 98.3 F, Pulse: 83 bpm, Respiratory Rate: 16 breaths/min, Blood Pressure: 157/76 mmHg. Respiratory normal breathing without difficulty. clear to auscultation bilaterally. Cardiovascular regular rate and rhythm with normal S1, S2. Psychiatric this patient is able to make decisions and demonstrates good insight into disease process. Alert and Oriented x 3. pleasant and cooperative. General Notes: Patient is just a very small wound on the back of the posterior calf region on the left that is open and training this appears to be a blister type region. There are no specific wounds at this time his swelling is definitely up again were ANTJUAN, ROTHE. ([id number]) unsure of exactly how long the wrap has been off at this point. Nonetheless he does not have the Velcro reduction wrap as of yet. Integumentary (Hair, Skin) Wound #15 status is Healed - Epithelialized. Original cause of wound was Trauma. The wound is located on the Left Lower Leg. The wound measures 0cm length x  0cm width x 0cm depth; 0cm^2 area and 0cm^3 volume. Wound #16 status is Healed - Epithelialized. Original cause of wound was Blister. The wound is located on the Left,Lateral Lower Leg. The wound measures 0cm length x 0cm width x 0cm depth; 0cm^2 area and 0cm^3 volume. Wound #17 status is Healed - Epithelialized. Original cause of wound was Blister. The wound is located on the Right,Lateral Lower Leg. The wound measures 0cm length x 0cm width x  0cm depth; 0cm^2 area and 0cm^3 volume. 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 Post procedure Diagnosis Wound #: Same as Pre-Procedure Plan Wound Cleansing: Cleanse wound with mild soap and water Skin Barriers/Peri-Wound Care: Moisturizing lotion - to both lower legs Secondary Dressing: ABD pad - to any areas of draining lymphedema Dressing Change Frequency: Change Dressing Monday, Wednesday, Friday Edema Control: 3 Layer Compression System - Bilateral - patient has draining lymphedema and requires compression wraps for this - he has velcro compression wraps ordered through the [facility] that should arrive in about 1 week Home Health: [place] [facility] Adam Merritt, Adam Merritt ([id number]) [place] Nurse may visit PRN to address patient s wound care needs. Home Health Nurse may visit PRN to address patient s wound care needs. FACE TO FACE ENCOUNTER: MEDICARE and MEDICAID PATIENTS: I certify that this patient is under my care and that I had a face-to-face encounter that meets the physician face-to-face encounter requirements with this patient on this  date. The encounter with the patient was in whole or in part for the following MEDICAL CONDITION: (primary reason for [place]) MEDICAL NECESSITY: I certify, that based on my findings, NURSING services are a medically necessary home health service. HOME BOUND STATUS: I certify that my clinical findings support that this patient is homebound (i.e., Due to illness or injury, pt requires aid of supportive devices such as crutches, cane, wheelchairs, walkers, the use of special transportation or the assistance of another person to leave their place of residence. There is a normal inability to leave the home and doing so requires considerable and taxing effort. Other absences are for medical reasons / religious services and are infrequent or of short duration when for other reasons). FACE TO FACE ENCOUNTER: MEDICARE and MEDICAID PATIENTS: I certify that this patient is under my care and that I had a face-to-face encounter that meets the physician face-to-face encounter requirements with this patient on this date. The encounter with the patient was in whole or in part for the following MEDICAL CONDITION: (primary reason for [place]) MEDICAL NECESSITY: I certify, that based on my findings, NURSING services are a medically necessary home health service. HOME BOUND STATUS: I certify that my clinical findings support that this patient is homebound (i.e., Due to illness or injury, pt requires aid of supportive devices such as crutches, cane, wheelchairs, walkers, the use of special transportation or the assistance of another person to leave their place of residence. There is a normal inability to leave the home and doing so requires considerable and taxing effort. Other absences are for medical reasons / religious services and are infrequent or of short duration when for other reasons). If current dressing causes regression in wound condition, may D/C ordered dressing product/s and apply Normal  Saline Moist Dressing daily until next [place] / Other MD appointment. [place] of regression in wound condition at [phone]. If current dressing causes regression in wound condition, may D/C ordered dressing product/s and apply Normal Saline Moist Dressing daily until next [place] / Other MD appointment. [place] of regression in wound condition at [phone].  Please direct any NON-WOUND related issues/requests for orders to patient's Primary Care Physician Please direct any NON-WOUND related issues/requests for orders to patient's Primary Care Physician My suggestion at this point is gonna be that we go ahead and initiate the above wound care measures for the next week. Patient is in agreement with plan. At that point we will look toward switch over to the Velcro compression wrap, reduction wrap, that has been ordered by the [facility] hospital. Hopefully everything will be going well with his toe as well and he will be ready to get in with the lymphedema clinic. If anything changes or worsens meantime he will let us know otherwise will see were things stand in one week. Please see above for specific wound care orders. We will see patient for re-evaluation in 1 week(s) here in the clinic. If anything worsens or changes patient will contact our office for additional recommendations. Electronic Signature(s) Signed: 07/09/2018 5:55:30 PM By: Worthy Keeler PA-C Entered By: Worthy Keeler on 07/09/2018 14:25:45 Adam Merritt ([id number]) -------------------------------------------------------------------------------- ROS/PFSH Details Patient Name: Adam Merritt Date of Service: 07/09/2018 1:45 PM Medical Record Number: [id number] Patient Account Number: [id number] Date of Birth/Sex: Aug 28, 1945 (73 y.o. M) Treating RN: Montey Hora Primary Care Provider: [name] Other Clinician: Referring Provider: [name] Treating  Provider/Extender: Melburn Hake, [name]: 13 Information Obtained From Patient Wound History Do you currently have one or more open woundso No Have you tested positive for osteomyelitis (bone infection)o No Have you had any tests for circulation on your legso Yes Constitutional Symptoms (General Health) Complaints and Symptoms: Negative for: Fever; Chills; Marked Weight Change Cardiovascular Complaints and Symptoms: Positive for: LE edema Medical History: Positive for: Congestive Heart Failure; Coronary Artery Disease; Hypertension; Peripheral Venous Disease Negative for: Angina; Arrhythmia; Deep Vein Thrombosis; Hypotension; Myocardial Infarction; Peripheral Arterial Disease; Phlebitis; Vasculitis Past Medical History Notes: varicose veins Eyes Medical History: Negative for: Cataracts; Glaucoma; Optic Neuritis Ear/Nose/Mouth/Throat Medical History: Negative for: Chronic sinus problems/congestion; Middle ear problems Hematologic/Lymphatic Medical History: Positive for: Anemia - aplastic anemia; Lymphedema Negative for: Hemophilia; Human Immunodeficiency Virus; Sickle Cell Disease Respiratory Complaints and Symptoms: No Complaints or Symptoms Medical History: Positive for: Chronic Obstructive Pulmonary Disease (COPD); Sleep Apnea Negative for: Aspiration; Asthma; Pneumothorax; Tuberculosis Past Medical History Notes: Adam Merritt, Adam Merritt ([id number]) home O2 at night as needed Gastrointestinal Medical History: Negative for: Cirrhosis ; Colitis; Crohnos; Hepatitis A; Hepatitis B; Hepatitis C Endocrine Medical History: Positive for: Type II Diabetes Negative for: Type I Diabetes Time with diabetes: since 2006 Treated with: Insulin Blood sugar tested every day: Yes Tested : Blood sugar testing results: Breakfast: 209 Genitourinary Medical History: Negative for: End Stage Renal Disease Immunological Medical History: Negative for: Lupus Erythematosus;  Raynaudos; Scleroderma Integumentary (Skin) Medical History: Negative for: History of Burn; History of pressure wounds Musculoskeletal Medical History: Positive for: Osteoarthritis Negative for: Gout; Rheumatoid Arthritis; Osteomyelitis Neurologic Medical History: Positive for: Neuropathy Negative for: Dementia; Quadriplegia; Paraplegia; Seizure Disorder Past Medical History Notes: CVA - 1992 Oncologic Medical History: Positive for: Received Chemotherapy - last dose 2006 Negative for: Received Radiation Psychiatric Complaints and Symptoms: No Complaints or Symptoms Medical History: Adam Merritt, Adam Merritt ([id number]) Negative for: Anorexia/bulimia; Confinement Anxiety Immunizations Pneumococcal Vaccine: Received Pneumococcal Vaccination: Yes Implantable Devices Hospitalization / Surgery History Name of Hospital Purpose of Hospitalization/[place] Date [facility] bronchitis 11/03/2013 Family and Social History Cancer: Yes - Paternal Grandparents; Diabetes: No; Heart Disease: No; Hereditary Spherocytosis: No; Hypertension: No; Kidney Disease: Yes - Siblings; Lung  Disease: Yes - Mother; Seizures: No; Stroke: No; Thyroid Problems: No; Tuberculosis: No; Never smoker; Marital Status - Married; Alcohol Use: Never; Drug Use: No History; Caffeine Use: Never; Financial Concerns: No; Food, Clothing or Shelter Needs: No; Support System Lacking: No; Transportation Concerns: No; Advanced Directives: No; Patient does not want information on Advanced Directives Physician Affirmation I have reviewed and agree with the above information. Electronic Signature(s) Signed: 07/09/2018 4:52:45 PM By: Montey Hora Signed: 07/09/2018 5:55:30 PM By: Worthy Keeler PA-C Entered By: Worthy Keeler on 07/09/2018 14:24:33 Adam Merritt ([id number]) -------------------------------------------------------------------------------- SuperBill Details Patient Name: Adam Merritt Date of Service: 07/09/2018 Medical Record  Number: [id number] Patient Account Number: [id number] Date of Birth/Sex: May 15, 1946 (73 y.o. 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 Physician Procedures CPT4 Code Description: [id number] - WC PHYS LEVEL 4 - EST PT ICD-10 Diagnosis Description I89.0 Lymphedema, not elsewhere classified L97.512 Non-pressure chronic ulcer of other part of right foot with fat L97.822 Non-pressure chronic ulcer of other  part of left lower leg with L98.492 Non-pressure chronic ulcer of skin of other sites with fat layer Modifier: layer exposed fat layer expos exposed Quantity: 1 ed Electronic Signature(s) Signed: 07/09/2018 5:55:30 PM By: Worthy Keeler PA-C Entered By: Worthy Keeler on 07/09/2018 14:26:09

## 2018-07-10 NOTE — Progress Notes (Signed)
STEWART, SASAKI ([id number]) Visit Report for 07/09/2018 Arrival Information Details Patient Name: Adam Merritt, Adam Merritt Date of Service: 07/09/2018 1:45 PM Medical Record Number: [id number] Patient Account Number: [id number] Date of Birth/Sex: Jun 08, 1945 (72 y.o. M) Treating RN: Montey Hora Primary Care Chaniyah Jahr: [name] Other Clinician: Referring Lavonne Cass: [name] Treating Daneshia Tavano/Extender: Melburn Hake, [name]: 13 Visit Information History Since Last Visit Added or deleted any medications: No Patient Arrived: Wheel Chair Any new allergies or adverse reactions: No Arrival Time: 13:43 Had a fall or experienced change in No Accompanied By: wife activities of daily living that may affect Transfer Assistance: None risk of falls: Patient Identification Verified: Yes Signs or symptoms of abuse/neglect since last visito No Secondary Verification Process Yes Hospitalized since last visit: No Completed: Implantable device outside of the clinic excluding No Patient Has Alerts: Yes cellular tissue based products placed in the center Patient Alerts: Patient on Blood since last visit: Thinner Has Dressing in Place as Prescribed: No Aspirin 325mg  Pain Present Now: Yes Type II Diabetes ABI 5/19 L 1.1 R 0.9 TBI L 0.6 R 0.6 Electronic Signature(s) Signed: 07/09/2018 4:14:38 PM By: Lorine Bears RCP, RRT, CHT Entered By: Lorine Bears on 07/09/2018 13:44:11 Adam Merritt ([id number]) -------------------------------------------------------------------------------- Compression Therapy Details Patient Name: Adam Merritt Date of Service: 07/09/2018 1:45 PM Medical Record Number: [id number] Patient Account Number: [id number] Date of Birth/Sex: [birth year] (72 y.o. M) Treating RN: Montey Hora Primary Care Kylea Berrong: [name] Other Clinician: Referring Tajh Livsey: [name] Treating Saahil Herbster/Extender: Melburn Hake, [name]:  13 Compression Therapy Performed for Wound Assessment: NonWound Condition Lymphedema - Bilateral Leg Performed By: Clinician Montey Hora, RN Compression Type: Three Layer Post Procedure Diagnosis Same as Pre-procedure Electronic Signature(s) Signed: 07/09/2018 4:52:45 PM By: Montey Hora Entered By: Montey Hora on 07/09/2018 14:19:35 Adam Merritt ([id number]) -------------------------------------------------------------------------------- Encounter Discharge Information Details Patient Name: Adam Merritt Date of Service: 07/09/2018 1:45 PM Medical Record Number: [id number] Patient Account Number: [id number] Date of Birth/Sex: 1946/03/05 (72 y.o. M) Treating RN: Montey Hora Primary Care Evelyna Folker: [name] Other Clinician: Referring Brittiany Wiehe: [name] Treating Lyvia Mondesir/Extender: Melburn Hake, [name]: 32 Encounter Discharge Information Items Discharge Condition: Stable Ambulatory Status: Wheelchair Discharge Destination: Home Transportation: Private Auto Accompanied By: wife Schedule Follow-up Appointment: Yes Clinical Summary of Care: Electronic Signature(s) Signed: 07/09/2018 4:52:45 PM By: Montey Hora Entered By: Montey Hora on 07/09/2018 14:20:46 Adam Merritt ([id number]) -------------------------------------------------------------------------------- Lower Extremity Assessment Details Patient Name: Adam Merritt Date of Service: 07/09/2018 1:45 PM Medical Record Number: [id number] Patient Account Number: [id number] Date of Birth/Sex: 08-26-1945 (72 y.o. M) Treating RN: Cornell Barman Primary Care Kerrilyn Azbill: [name] Other Clinician: Referring Marlie Kuennen: [name] Treating Cavan Bearden/Extender: Melburn Hake, [name]: 13 Edema Assessment Assessed: [Left: No] [Right: No] [Left: Edema] [Right: :] Calf Left: Right: Point of Measurement: 34 cm From Medial Instep 65 cm 60 cm Ankle Left: Right: Point of Measurement: 12 cm From  Medial Instep 43 cm 34 cm Vascular Assessment Pulses: Dorsalis Pedis Palpable: [Left:No] [Right:Yes] Doppler Audible: [Left:Yes] Posterior Tibial Extremity colors, hair growth, and conditions: Extremity Color: [Left:Hyperpigmented] [Right:Hyperpigmented] Hair Growth on Extremity: [Left:No] [Right:No] Temperature of Extremity: [Left:Warm] [Right:Warm] Capillary Refill: [Left:> 3 seconds] Toe Nail Assessment Left: Right: Thick: Yes Discolored: Yes Deformed: Yes Improper Length and Hygiene: Yes Notes 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 M) Treating RN: Montey Hora Primary Care Jaidyn Kuhl: [name] Other Clinician: Referring Beautifull Cisar: [name] Treating Marykatherine Sherwood/Extender: Melburn Hake, [name]: 13 Vital Signs Height(in): 66 Pulse(bpm): 83 Weight(lbs): 323 Blood Pressure(mmHg): 157/76 Body Mass Index(BMI): 52 Temperature(F): 98.3 Respiratory Rate 16 (breaths/min): Wound Assessments Treatment Notes Electronic Signature(s) Signed: 07/09/2018 4:52:45 PM By: Montey Hora Entered By: Montey Hora on 07/09/2018 14:12:13 Adam Merritt ([id number]) -------------------------------------------------------------------------------- [place] Details Patient Name: Adam Merritt Date of Service: 07/09/2018 1:45 PM Medical Record Number: [id number] Patient Account Number: [id number] Date of Birth/Sex: 06/01/1946 (72 y.o. M) Treating RN: Montey Hora Primary Care Vandy Tsuchiya: [name] Other  Clinician: Referring Jennfier Abdulla: [name] Treating Amarea Macdowell/Extender: Melburn Hake, [name]: 32 Active Inactive Abuse / Safety / Falls / Self Care Management Nursing Diagnoses: Potential for falls Goals: Patient will remain injury free related to falls Date Initiated: 04/09/2018 Target Resolution Date: 07/13/2018 Goal Status: Active Interventions: Assess fall risk on admission and as needed Notes: Orientation to the Wound Care Program Nursing Diagnoses: Knowledge deficit related to the wound healing center program Goals: Patient/caregiver will verbalize understanding of the [place] Program Date Initiated: 04/09/2018 Target Resolution Date: 07/13/2018 Goal Status: Active Interventions: Provide education on orientation to the wound center Notes: Venous Leg Ulcer Nursing Diagnoses: Actual venous Insuffiency (use after diagnosis is confirmed) Goals: Patient will maintain optimal edema control Date Initiated: 04/09/2018 Target Resolution Date: 07/13/2018 Goal Status: Active Interventions: Assess peripheral edema status every visit. Adam Merritt, Adam Merritt ([id number]) Compression as ordered Provide education on venous insufficiency Notes: Electronic Signature(s) Signed: 07/09/2018 4:52:45 PM By: Montey Hora Entered By: Montey Hora on 07/09/2018 14:12:05 Adam Merritt ([id number]) -------------------------------------------------------------------------------- Pain Assessment Details Patient Name: Adam Merritt Date of Service: 07/09/2018 1:45 PM Medical Record Number: [id number] Patient Account Number: [id number] Date of Birth/Sex: Aug 02, 1945 (72 y.o. M) Treating RN: Montey Hora Primary Care Higinio Grow: [name] Other Clinician: Referring Jinnie Onley: [name] Treating Arthur Aydelotte/Extender: Melburn Hake, [name]: 13 Active Problems Location of Pain Severity and Description of Pain Patient Has Paino Yes Site Locations Rate the  pain. Current Pain Level: 7 Pain Management and Medication Current Pain Management: Electronic Signature(s) Signed: 07/09/2018 4:14:38 PM By: Lorine Bears RCP, RRT, CHT Signed: 07/09/2018 4:52:45 PM By: Montey Hora Entered By: Lorine Bears on 07/09/2018 13:44:21 Adam Merritt ([id number]) -------------------------------------------------------------------------------- Patient/Caregiver Education Details Patient Name: Adam Merritt Date of Service: 07/09/2018 1:45 PM Medical Record Number: [id number] Patient Account Number: [id number] Date of Birth/Gender: Jul 18, 1945 (72 y.o. M) Treating RN: Montey Hora Primary Care Physician: [name] Other Clinician: Referring Physician: [name] Treating Physician/Extender: Sharalyn Ink in Treatment: 13 Education Assessment Education Provided To: Patient and Caregiver Education Topics Provided Venous: Handouts: Other: bring velcro compression wraps next visit Methods: Explain/Verbal Responses: State content correctly Electronic Signature(s) Signed: 07/09/2018 4:52:45 PM By: Montey Hora Entered By: Montey Hora on 07/09/2018 14:21:34 Adam Merritt ([id number]) -------------------------------------------------------------------------------- Wound Assessment Details Patient Name: Adam Merritt Date of Service: 07/09/2018 1:45 PM Medical Record Number: [id number] Patient Account Number: [id number] Date of Birth/Sex: Sep 15, 1945 (72 y.o. M) Treating RN: Montey Hora Primary Care Annella Prowell: [name] Other Clinician: Referring Khale Nigh: [name] Treating Holston Oyama/Extender: STONE III, [name]: 13 Wound Status Wound Number:  15 Primary Etiology: Trauma, Other Wound Location: Left Lower Leg Wound Status: Healed - Epithelialized Wounding Event: Trauma Date Acquired: 05/22/2018 Weeks Of Treatment: 6 Clustered Wound: No Wound Measurements Length: (cm) 0 Width: (cm)  0 Depth: (cm) 0 Area: (cm) 0 Volume: (cm) 0 % Reduction in Area: 100% % Reduction in Volume: 100% Wound Description Full Thickness Without Exposed Support Classification: Structures Periwound Skin Texture Texture Color No Abnormalities Noted: No No Abnormalities Noted: No Moisture No Abnormalities Noted: No Electronic Signature(s) Signed: 07/09/2018 4:52:45 PM By: Montey Hora Entered By: Montey Hora on 07/09/2018 14:19:10 Adam Merritt ([id number]) -------------------------------------------------------------------------------- Wound Assessment Details Patient Name: Adam Merritt Date of Service: 07/09/2018 1:45 PM Medical Record Number: [id number] Patient Account Number: [id number] Date of Birth/Sex: Jun 24, 1945 (72 y.o. M) Treating RN: Montey Hora Primary Care Romulo Okray: [name] Other Clinician: Referring Avani Sensabaugh: [name] Treating Sheetal Lyall/Extender: Melburn Hake, [name]: 13 Wound Status Wound Number: 16 Primary Etiology: Venous Leg Ulcer Wound Location: Left, Lateral Lower Leg Wound Status: Healed - Epithelialized Wounding Event: Blister Date Acquired: 05/20/2018 Weeks Of Treatment: 4 Clustered Wound: No Wound Measurements Length: (cm) 0 Width: (cm) 0 Depth: (cm) 0 Area: (cm) 0 Volume: (cm) 0 % Reduction in Area: 100% % Reduction in Volume: 100% Wound Description Classification: Partial Thickness Periwound Skin Texture Texture Color No Abnormalities Noted: No No Abnormalities Noted: No Moisture No Abnormalities Noted: No Electronic Signature(s) Signed: 07/09/2018 4:52:45 PM By: Montey Hora Entered By: Montey Hora on 07/09/2018 14:19:10 Adam Merritt ([id number]) -------------------------------------------------------------------------------- Wound Assessment Details Patient Name: Adam Merritt Date of Service: 07/09/2018 1:45 PM Medical Record Number: [id number] Patient Account Number: [id number] Date of  Birth/Sex: May 25, 1946 (72 y.o. M) Treating RN: Montey Hora Primary Care Lailyn Appelbaum: [name] Other Clinician: Referring Tinea Nobile: [name] Treating Medina Degraffenreid/Extender: Melburn Hake, [name]: 13 Wound Status Wound Number: 17 Primary Etiology: Venous Leg Ulcer Wound Location: Right, Lateral Lower Leg Wound Status: Healed - Epithelialized Wounding Event: Blister Date Acquired: 05/20/2018 Weeks Of Treatment: 4 Clustered Wound: No Wound Measurements Length: (cm) 0 Width: (cm) 0 Depth: (cm) 0 Area: (cm) 0 Volume: (cm) 0 % Reduction in Area: 100% % Reduction in Volume: 100% Wound Description Classification: Partial Thickness Periwound Skin Texture Texture Color No Abnormalities Noted: No No Abnormalities Noted: No Moisture No Abnormalities Noted: No Electronic Signature(s) Signed: 07/09/2018 4:52:45 PM By: Montey Hora Entered By: Montey Hora on 07/09/2018 14:19:11 Adam Merritt ([id number]) -------------------------------------------------------------------------------- Vitals Details Patient Name: Adam Merritt Date of Service: 07/09/2018 1:45 PM Medical Record Number: [id number] Patient Account Number: [id number] Date of Birth/Sex: 1945-06-12 (73 y.o. M) Treating RN: Montey Hora Primary Care Georgena Weisheit: [name] Other Clinician: Referring Chaddrick Brue: [name] Treating Ivalene Platte/Extender: Melburn Hake, [name]: 13 Vital Signs Time Taken: 13:44 Temperature (F): 98.3 Height (in): 66 Pulse (bpm): 83 Weight (lbs): 323 Respiratory Rate (breaths/min): 16 Body Mass Index (BMI): 52.1 Blood Pressure (mmHg): 157/76 Reference Range: 80 - 120 mg / dl Airway Electronic Signature(s) Signed: 07/09/2018 4:14:38 PM By: Lorine Bears RCP, RRT, CHT Entered By: Becky Sax, Amado Nash on 07/09/2018 13:46:24

## 2018-07-10 NOTE — Telephone Encounter (Signed)
TRIED CALLING PT BUT COULD NOT LMOM AND CALLED KEISHA FROM [facility] AND NOTIFIED HER.

## 2018-07-11 ENCOUNTER — Telehealth: Payer: Self-pay

## 2018-07-11 NOTE — Telephone Encounter (Signed)
PT WAS UNABLE TO PARTICIPATE [place] ON 06-21-18.

## 2018-07-15 ENCOUNTER — Encounter: Payer: Medicare Other | Admitting: Occupational Therapy

## 2018-07-15 ENCOUNTER — Other Ambulatory Visit: Payer: Self-pay | Admitting: Adult Health

## 2018-07-15 ENCOUNTER — Other Ambulatory Visit: Payer: Self-pay

## 2018-07-15 DIAGNOSIS — F5101 Primary insomnia: Secondary | ICD-10-CM

## 2018-07-15 MED ORDER — PENTOXIFYLLINE ER 400 MG PO TBCR: EXTENDED_RELEASE_TABLET | ORAL | 3 refills | Status: DC

## 2018-07-16 ENCOUNTER — Encounter: Payer: Medicare Other | Admitting: Physician Assistant

## 2018-07-16 DIAGNOSIS — E11621 Type 2 diabetes mellitus with foot ulcer: Secondary | ICD-10-CM | POA: Diagnosis not present

## 2018-07-16 NOTE — Telephone Encounter (Signed)
Can you send if possible

## 2018-07-19 ENCOUNTER — Encounter: Payer: Self-pay | Admitting: Adult Health

## 2018-07-19 ENCOUNTER — Ambulatory Visit (INDEPENDENT_AMBULATORY_CARE_PROVIDER_SITE_OTHER): Payer: Medicare Other | Admitting: Adult Health

## 2018-07-19 VITALS — BP 116/74 | HR 81 | Temp 97.1°F | Resp 16 | Ht 66.0 in | Wt 292.0 lb

## 2018-07-19 DIAGNOSIS — G894 Chronic pain syndrome: Secondary | ICD-10-CM

## 2018-07-19 DIAGNOSIS — I10 Essential (primary) hypertension: Secondary | ICD-10-CM

## 2018-07-19 DIAGNOSIS — R110 Nausea: Secondary | ICD-10-CM | POA: Diagnosis not present

## 2018-07-19 DIAGNOSIS — E11621 Type 2 diabetes mellitus with foot ulcer: Secondary | ICD-10-CM | POA: Diagnosis not present

## 2018-07-19 MED ORDER — ONDANSETRON HCL 4 MG PO TABS: 4.0000 mg | ORAL_TABLET | Freq: Three times a day (TID) | ORAL | 0 refills | Status: DC | PRN

## 2018-07-19 NOTE — Progress Notes (Signed)
[facility] [place], [place] [address]  Internal MEDICINE  Office Visit Note  Patient Name: Adam Merritt  [id number]  [id number]  Date of Service: 10/07/2018  Chief Complaint  Patient presents with  . Nausea    LOSS OF APPETITE , DRY HEAVING ,   . Medical Management of Chronic Issues    MEDICATION REFILLS [facility]   . Foot Pain    PAIN IN THE FEET ,PATCHES NOT WORKING      HPI Pt is here for a sick visit. Pt reports he has had some anorexia lately and has been dry heaving.  He reports the pain in is feet is worse and he does not feel like the fentanyl patches are working. He is in need of paper RX to take to the [place] for filling.       Current Medication:  Outpatient Encounter Medications as of 07/19/2018  Medication Sig  . albuterol (PROVENTIL HFA;VENTOLIN HFA) 108 (90 Base) MCG/ACT inhaler INHALE 1 PUFF INTO THE LUNGS EVERY 6 HOURS AS NEEDED FOR WHEEZING OR SHORTNESS OF BREATH (Patient taking differently: Inhale 1 puff into the lungs every 6 (six) hours as needed for wheezing or shortness of breath. )  . aspirin 325 MG EC tablet Take 325 mg by mouth daily.  Marland Kitchen atorvastatin (LIPITOR) 40 MG tablet Take 40 mg by mouth at bedtime.   . carvedilol (COREG) 25 MG tablet Take 25 mg by mouth 2 (two) times daily with a meal.  . febuxostat (ULORIC) 40 MG tablet Take 1 tablet (40 mg total) by mouth daily.  Marland Kitchen gabapentin (NEURONTIN) 300 MG capsule Take 1 capsule po in AM and 1 capsule po in pm. Take 2 capsules po at bedtime. (Patient taking differently: Take 2 cap in the morning, 1 cap in the evening and 1 cap at night.)  . indapamide (LOZOL) 2.5 MG tablet Take 1 tablet (2.5 mg total) by mouth daily.  . Insulin Glargine (BASAGLAR KWIKPEN) 100 UNIT/ML SOPN INJECT 30 TO 36 UNITS UNDER THE SKIN EVERY DAY (Patient taking differently: Inject 30 Units into the skin daily. )  . ipratropium-albuterol (DUONEB) 0.5-2.5 (3) MG/3ML SOLN Take 3 mLs by nebulization  every 4 (four) hours as needed (for shortness of breath).  . Multiple Vitamin (MULTIVITAMIN WITH MINERALS) TABS tablet Take 1 tablet by mouth daily.  . potassium chloride SA (K-DUR,KLOR-CON) 20 MEQ tablet Take 2 tablets (40 mEq total) by mouth 2 (two) times daily. (Patient taking differently: Take 20 mEq by mouth 3 (three) times daily. )  . sennosides-docusate sodium (SENOKOT-S) 8.6-50 MG tablet Take 1-2 tablets by mouth 2 (two) times daily.  . [DISCONTINUED] docusate sodium (COLACE) 50 MG capsule Take 50 mg by mouth 2 (two) times daily.  . [DISCONTINUED] ENSURE MAX PROTEIN (ENSURE MAX PROTEIN) LIQD Take 330 mLs (11 oz total) by mouth 2 (two) times daily. (Patient not taking: Reported on 08/13/2018)  . [DISCONTINUED] fentaNYL (DURAGESIC) 50 MCG/HR Place 1 patch onto the skin every 3 (three) days.  . [DISCONTINUED] furosemide (LASIX) 40 MG tablet Take 1 tablet (40 mg total) by mouth daily.  . [DISCONTINUED] liraglutide (VICTOZA) 18 MG/3ML SOPN Inject 0.3 mLs (1.8 mg total) into the skin daily.  . [DISCONTINUED] omeprazole (PRILOSEC) 20 MG capsule Take 20 mg by mouth daily.  . [DISCONTINUED] pentoxifylline (TRENTAL) 400 MG CR tablet TAKE 1 TABLET BY MOUTH THREE TIMES DAILY FOR CLAUDICATION  . [DISCONTINUED] zolpidem (AMBIEN) 5 MG tablet Take 1 tablet (  5 mg total) by mouth at bedtime as needed for sleep.  Marland Kitchen HYDROcodone-acetaminophen (NORCO/VICODIN) 5-325 MG tablet Take 1 tablet by mouth every 6 (six) hours as needed for moderate pain or severe pain.  . [DISCONTINUED] ondansetron (ZOFRAN) 4 MG tablet Take 1 tablet (4 mg total) by mouth every 8 (eight) hours as needed for nausea or vomiting. (Patient not taking: Reported on 08/13/2018)   No facility-administered encounter medications on file as of 07/19/2018.       Medical History: Past Medical History:  Diagnosis Date  . Anemia   . CAD (coronary artery disease)   . Chest pain   . Coronary artery disease   . Diabetes mellitus without complication  ([place])   . Diastolic CHF ([place])   . Esophageal reflux   . Herpes zoster without mention of complication   . Hyperlipidemia   . Hypertension   . Insomnia   . Lumbago   . Lymphedema   . Neuropathy in diabetes ([place])   . Obstructive chronic bronchitis without exacerbation ([place])   . Other malaise and fatigue   . Stroke ([place])   . Varicose veins      Vital Signs: BP 116/74   Pulse 81   Temp (!) 97.1 F (36.2 C)   Resp 16   Ht 5\' 6"  (1.676 m)   Wt 292 lb (132.5 kg)   SpO2 98%   BMI 47.13 kg/m    Review of Systems  Constitutional: Negative.  Negative for chills, fatigue and unexpected weight change.  HENT: Negative.  Negative for congestion, rhinorrhea, sneezing and sore throat.   Eyes: Negative for redness.  Respiratory: Negative.  Negative for cough, chest tightness and shortness of breath.   Cardiovascular: Negative.  Negative for chest pain and palpitations.  Gastrointestinal: Negative.  Negative for abdominal pain, constipation, diarrhea, nausea and vomiting.  Endocrine: Negative.   Genitourinary: Negative.  Negative for dysuria and frequency.  Musculoskeletal: Negative.  Negative for arthralgias, back pain, joint swelling and neck pain.  Skin: Negative.  Negative for rash.  Allergic/Immunologic: Negative.   Neurological: Negative.  Negative for tremors and numbness.  Hematological: Negative for adenopathy. Does not bruise/bleed easily.  Psychiatric/Behavioral: Negative.  Negative for behavioral problems, sleep disturbance and suicidal ideas. The patient is not nervous/anxious.     Physical Exam Vitals signs and nursing note reviewed.  Constitutional:      General: He is not in acute distress.    Appearance: He is well-developed. He is not diaphoretic.  HENT:     Head: Normocephalic and atraumatic.     Mouth/Throat:     Pharynx: No oropharyngeal exudate.  Eyes:     Pupils: Pupils are equal, round, and reactive to light.  Neck:     Musculoskeletal: Normal range of  motion and neck supple.     Thyroid: No thyromegaly.     Vascular: No JVD.     Trachea: No tracheal deviation.  Cardiovascular:     Rate and Rhythm: Normal rate and regular rhythm.     Heart sounds: Normal heart sounds. No murmur. No friction rub. No gallop.   Pulmonary:     Effort: Pulmonary effort is normal. No respiratory distress.     Breath sounds: Normal breath sounds. No wheezing or rales.  Chest:     Chest wall: No tenderness.  Abdominal:     Palpations: Abdomen is soft.     Tenderness: There is no abdominal tenderness. There is no guarding.  Musculoskeletal: Normal range of motion.  Lymphadenopathy:     Cervical: No cervical adenopathy.  Skin:    General: Skin is warm and dry.  Neurological:     Mental Status: He is alert and oriented to person, place, and time.     Cranial Nerves: No cranial nerve deficit.  Psychiatric:        Behavior: Behavior normal.        Thought Content: Thought content normal.        Judgment: Judgment normal.       Assessment/Plan: 1. Nausea Sent RX for Zofran for patient.  4mg  q 6 hours as needed for nausea.  I encouraged him to take 15 minutes before attempting to eat a meal. He is to call the office if he is still unable to eat.   2. Chronic pain disorder Encouraged patient to continue using fentanyl, and take his gabapentin as prescribed, since he is not taking it currently.   3. Essential hypertension Stable, continue present management.   General Counseling: Adam Merritt verbalizes understanding of the findings of todays visit and agrees with plan of treatment. I have discussed any further diagnostic evaluation that may be needed or ordered today. We also reviewed his medications today. he has been encouraged to call the office with any questions or concerns that should arise related to todays visit.   No orders of the defined types were placed in this encounter.   Meds ordered this encounter  Medications  . DISCONTD: ondansetron  (ZOFRAN) 4 MG tablet    Sig: Take 1 tablet (4 mg total) by mouth every 8 (eight) hours as needed for nausea or vomiting.    Dispense:  20 tablet    Refill:  0    Time spent: 25 Minutes  This patient was seen by Orson Gear AGNP-C in Collaboration with Dr [name] as a part of collaborative care agreement.  Kendell Bane AGNP-C Internal Medicine

## 2018-07-19 NOTE — Patient Instructions (Signed)
Nausea, Adult Nausea is feeling sick to your stomach or feeling that you are about to throw up (vomit). Feeling sick to your stomach is usually not serious, but it may be an early sign of a more serious medical problem. As you feel sicker to your stomach, you may throw up. If you throw up, or if you are not able to drink enough fluids, there is a risk that you may lose too much water in your body (get dehydrated). If you lose too much water in your body, you may:  Feel tired.  Feel thirsty.  Have a dry mouth.  Have cracked lips.  Go pee (urinate) less often. Older adults and people who have other diseases or a weak body defense system (immune system) have a higher risk of losing too much water in the body. The main goals of treating this condition are:  To relieve your nausea.  To ensure your nausea occurs less often.  To prevent throwing up and losing too much fluid. Follow these instructions at home: Watch your symptoms for any changes. Tell your doctor about them. Follow these instructions as told by your doctor. Eating and drinking      Take an ORS (oral rehydration solution). This is a drink that is sold at pharmacies and stores.  Drink clear fluids in small amounts as you are able. These include: ? Water. ? Ice chips. ? Fruit juice that has water added (diluted fruit juice). ? Low-calorie sports drinks.  Eat bland, easy-to-digest foods in small amounts as you are able, such as: ? Bananas. ? Applesauce. ? Rice. ? Low-fat (lean) meats. ? Toast. ? Crackers.  Avoid drinking fluids that have a lot of sugar or caffeine in them. This includes energy drinks, sports drinks, and soda.  Avoid alcohol.  Avoid spicy or fatty foods. General instructions  Take over-the-counter and prescription medicines only as told by your doctor.  Rest at home while you get better.  Drink enough fluid to keep your pee (urine) pale yellow.  Take slow and deep breaths when you feel  sick to your stomach.  Avoid food or things that have strong smells.  Wash your hands often with soap and water. If you cannot use soap and water, use hand sanitizer.  Make sure that all people in your home wash their hands well and often.  Keep all follow-up visits as told by your doctor. This is important. Contact a doctor if:  You feel sicker to your stomach.  You feel sick to your stomach for more than 2 days.  You throw up.  You are not able to drink fluids without throwing up.  You have new symptoms.  You have a fever.  You have a headache.  You have muscle cramps.  You have a rash.  You have pain while peeing.  You feel light-headed or dizzy. Get help right away if:  You have pain in your chest, neck, arm, or jaw.  You feel very weak or you pass out (faint).  You have throw up that is bright red or looks like coffee grounds.  You have bloody or black poop (stools) or poop that looks like tar.  You have a very bad headache, a stiff neck, or both.  You have very bad pain, cramping, or bloating in your belly (abdomen).  You have trouble breathing or you are breathing very quickly.  Your heart is beating very quickly.  Your skin feels cold and clammy.  You feel confused.    You have signs of losing too much water in your body, such as: ? Dark pee, very little pee, or no pee. ? Cracked lips. ? Dry mouth. ? Sunken eyes. ? Sleepiness. ? Weakness. These symptoms may be an emergency. Do not wait to see if the symptoms will go away. Get medical help right away. Call your local emergency services (911 in the [place] Do not drive yourself to the hospital. Summary  Nausea is feeling sick to your stomach or feeling that you are about to throw up (vomit).  If you throw up, or if you are not able to drink enough fluids, there is a risk that you may lose too much water in your body (get dehydrated).  Eat and drink what your doctor tells you. Take  over-the-counter and prescription medicines only as told by your doctor.  Contact a doctor right away if your symptoms get worse or you have new symptoms.  Keep all follow-up visits as told by your doctor. This is important. This information is not intended to replace advice given to you by your health care provider. Make sure you discuss any questions you have with your health care provider. Document Released: 05/11/2011 Document Revised: 10/30/2017 Document Reviewed: 10/30/2017 Elsevier Interactive Patient Education  2019 [employer]  

## 2018-07-20 NOTE — Progress Notes (Addendum)
ASHAN, CUEVA ([id number]) Visit Report for 07/19/2018 Arrival Information Details Patient Name: Adam Merritt, Adam Merritt Date of Service: 07/19/2018 9:15 AM Medical Record Number: [id number] Patient Account Number: [id number] Date of Birth/Sex: 1946-01-09 (72 y.o. M) Treating RN: Cornell Barman Primary Care Calley Drenning: [name] Other Clinician: Referring Kevonna Nolte: [name] Treating Andray Assefa/Extender: Melburn Hake, [name]: 14 Visit Information History Since Last Visit Added or deleted any medications: No Patient Arrived: Walker Any new allergies or adverse reactions: No Arrival Time: 09:15 Had a fall or experienced change in No Accompanied By: wife activities of daily living that may affect Transfer Assistance: None risk of falls: Patient Identification Verified: Yes Signs or symptoms of abuse/neglect since last visito No Secondary Verification Process Yes Hospitalized since last visit: No Completed: Implantable device outside of the clinic excluding No Patient Has Alerts: Yes cellular tissue based products placed in the center Patient Alerts: Patient on Blood since last visit: Thinner Has Dressing in Place as Prescribed: Yes Aspirin 325mg  Has Compression in Place as Prescribed: Yes Type II Diabetes ABI 5/19 L 1.1 R 0.9 Has Footwear/Offloading in Place as Prescribed: Yes TBI L 0.6 R 0.6 Left: Regular Shoe Right: Regular Shoe Pain Present Now: No Electronic Signature(s) Signed: 07/19/2018 9:40:03 AM By: Gretta Cool, BSN, RN, CWS, Kim RN, BSN Entered By: Gretta Cool, BSN, RN, CWS, Kim on 07/19/2018 09:40:03 Adam Merritt ([id number]) -------------------------------------------------------------------------------- Encounter Discharge Information Details Patient Name: Adam Merritt Date of Service: 07/19/2018 9:15 AM Medical Record Number: [id number] Patient Account Number: [id number] Date of Birth/Sex: 1946/04/10 (72 y.o. M) Treating RN: Cornell Barman Primary Care Jahari Wiginton: [name] Other Clinician: Referring Paton Crum: [name] Treating Mozel Burdett/Extender: Melburn Hake, [name]: 14 Encounter Discharge Information Items Discharge Condition: Stable Ambulatory Status: Walker Discharge Destination: Home Transportation: Private Auto Accompanied By: wife Schedule Follow-up Appointment: Yes Clinical Summary of Care: Electronic Signature(s) Signed: 07/19/2018 9:42:20 AM By: Gretta Cool, BSN, RN, CWS, Kim RN, BSN Entered By: Gretta Cool, BSN, RN, CWS, Kim on 07/19/2018 09:42:20 Adam Merritt ([id number]) -------------------------------------------------------------------------------- Non-Wound Condition Assessment Details Patient Name: Adam Merritt Date of Service: 07/19/2018 9:15 AM Medical Record Number: [id number] Patient Account Number: [id number] Date of Birth/Sex: 1945-12-29 (72 y.o. M) Treating RN: Cornell Barman Primary Care Verma Grothaus: [name] Other Clinician: Referring Joslyne Marshburn: [name] Treating Kristy Schomburg/Extender: STONE III, [name]: 14 Non-Wound Condition: Condition: Lymphedema Location: Leg Side: Bilateral Periwound Skin Texture Texture Color No Abnormalities Noted: No No Abnormalities Noted: No Callus: No Atrophie Blanche: No Crepitus: No Cyanosis: No Excoriation: No Ecchymosis: No Friable: No Erythema: No Induration: Yes Hemosiderin Staining: Yes Rash: No Mottled: No Scarring: No Pallor: No Rubor: No Moisture No Abnormalities Noted: No Temperature / Pain Dry / Scaly: No Temperature: No Abnormality Maceration: Yes Electronic Signature(s) Signed: 07/19/2018 9:40:32 AM By: Gretta Cool, BSN, RN, CWS, Kim RN, BSN Entered By: Gretta Cool, BSN, RN, CWS, Kim on 07/19/2018 09:40:32 Adam Merritt ([id number]) -------------------------------------------------------------------------------- Patient/Caregiver Education Details Patient Name: Adam Merritt Date of Service: 07/19/2018 9:15 AM Medical Record Number:  [id number] Patient Account Number: [id number] Date of Birth/Gender: 1946/04/21 (72 y.o. M) Treating RN: Cornell Barman Primary Care Physician: [name] Other Clinician: Referring Physician: [name] Treating Physician/Extender: Sharalyn Ink in Treatment: 14 Education Assessment Education Provided To: Patient Education Topics Provided Venous: Handouts: Controlling Swelling with Multilayered Compression Wraps Methods: Demonstration, Explain/Verbal Responses: State content correctly Electronic Signature(s) Signed: 07/19/2018 4:22:08 PM By: Gretta Cool, BSN, RN, CWS, Kim RN, BSN Entered By: Gretta Cool, BSN, RN, CWS,  Kim on 07/19/2018 09:41:18 Adam Merritt ([id number]) -------------------------------------------------------------------------------- Wound Assessment Details Patient Name: Adam Merritt, Adam Merritt Date of Service: 07/19/2018 9:15 AM Medical Record Number: [id number] Patient Account Number: [id number] Date of Birth/Sex: [birth year] (72 y.o. M) Treating RN: Cornell Barman Primary Care Hani Campusano: [name] Other Clinician: Referring Buren Havey: [name] Treating Livingston Denner/Extender: Melburn Hake, [name]: 14 Wound Status Wound Number: 18 Primary Etiology: Trauma, Other Wound Location: Left, Lateral Lower Leg Wound Status: Open Wounding Event: Trauma Date Acquired: 07/16/2018 Weeks Of Treatment: 0 Clustered Wound: No Wound Measurements Length: (cm) 3.5 Width: (cm) 3 Depth: (cm) 0.1 Area: (cm) 8.247 Volume: (cm) 0.825 % Reduction in Area: 0% % Reduction in Volume: 0% Wound Description Classification: Partial Thickness Periwound Skin Texture Texture Color No Abnormalities Noted: No No Abnormalities Noted: No Moisture No Abnormalities Noted: No Treatment Notes Wound #18 (Left, Lateral Lower Leg) 1. Cleansed with: Clean wound with Normal Saline 2. Anesthetic Topical Lidocaine 4% cream to wound bed prior to debridement Notes Silver Cell, ABD, 4 layer  wrap Electronic Signature(s) Signed: 07/19/2018 4:22:08 PM By: Gretta Cool, BSN, RN, CWS, Kim RN, BSN Entered By: Gretta Cool, BSN, RN, CWS, Kim on 07/19/2018 09:40:12

## 2018-07-21 NOTE — Progress Notes (Signed)
ROSEMARY, PENTECOST ([id number]) Visit Report for 07/16/2018 Arrival Information Details Patient Name: Adam Merritt, Adam Merritt Date of Service: 07/16/2018 12:45 PM Medical Record Number: [id number] Patient Account Number: [id number] Date of Birth/Sex: 1945-07-30 (72 y.o. M) Treating RN: Montey Hora Primary Care Octave Montrose: [name] Other Clinician: Referring Moet Mikulski: [name] Treating Celestial Barnfield/Extender: Melburn Hake, [name]: 14 Visit Information History Since Last Visit Added or deleted any medications: No Patient Arrived: Walker Any new allergies or adverse reactions: No Arrival Time: 12:56 Had a fall or experienced change in No Accompanied By: self activities of daily living that may affect Transfer Assistance: None risk of falls: Patient Identification Verified: Yes Signs or symptoms of abuse/neglect since last visito No Secondary Verification Process Yes Hospitalized since last visit: No Completed: Implantable device outside of the clinic excluding No Patient Has Alerts: Yes cellular tissue based products placed in the center Patient Alerts: Patient on Blood since last visit: Thinner Has Dressing in Place as Prescribed: No Aspirin 325mg  Pain Present Now: Yes Type II Diabetes ABI 5/19 L 1.1 R 0.9 TBI L 0.6 R 0.6 Electronic Signature(s) Signed: 07/16/2018 3:52:51 PM By: Lorine Bears RCP, RRT, CHT Entered By: Lorine Bears on 07/16/2018 12:57:09 Adam Merritt ([id number]) -------------------------------------------------------------------------------- Compression Therapy Details Patient Name: Adam Merritt Date of Service: 07/16/2018 12:45 PM Medical Record Number: [id number] Patient Account Number: [id number] Date of Birth/Sex: February 23, 1946 (72 y.o. M) Treating RN: Montey Hora Primary Care Khoury Siemon: [name] Other Clinician: Referring Dewel Lotter: [name] Treating Kelsi Benham/Extender: Melburn Hake, [name]:  14 Compression Therapy Performed for Wound Assessment: Wound #18 Left,Lateral Lower Leg Performed By: Clinician Montey Hora, RN Compression Type: Four Layer Pre Treatment ABI: 1.1 Post Procedure Diagnosis Same as Pre-procedure Electronic Signature(s) Signed: 07/16/2018 4:52:44 PM By: Montey Hora Entered By: Montey Hora on 07/16/2018 13:45:59 Adam Merritt ([id number]) -------------------------------------------------------------------------------- Encounter Discharge Information Details Patient Name: Adam Merritt Date of Service: 07/16/2018 12:45 PM Medical Record Number: [id number] Patient Account Number: [id number] Date of Birth/Sex: 1945-06-07 (73 y.o. M) Treating RN: Army Melia Primary Care Latunya Kissick: [name] Other Clinician: Referring Chucky Homes: [name] Treating Jacub Waiters/Extender: Melburn Hake, [name]: 14 Encounter Discharge Information Items Discharge Condition: Stable Ambulatory Status: Walker Discharge Destination: Home Transportation: Private Auto Accompanied By: wife Schedule Follow-up Appointment: Yes Clinical Summary of Care: Electronic Signature(s) Signed: 07/16/2018 4:35:39 PM By: Army Melia Entered By: Army Melia on 07/16/2018 14:13:39 Adam Merritt ([id number]) -------------------------------------------------------------------------------- Lower Extremity Assessment Details Patient Name: Adam Merritt Date of Service: 07/16/2018 12:45 PM Medical Record Number: [id number] Patient Account Number: [id number] Date of Birth/Sex: 04/17/1946 (72 y.o. M) Treating RN: Cornell Barman Primary Care Case Vassell: [name] Other Clinician: Referring Ulric Salzman: [name] Treating Neysa Arts/Extender: Melburn Hake, [name]: 14 Edema Assessment Assessed: [Left: No] [Right: No] [Left: Edema] [Right: :] Calf Left: Right: Point of Measurement: 34 cm From Medial Instep 65 cm 56.3 cm Ankle Left: Right: Point of Measurement: 12  cm From Medial Instep 39 cm 31.3 cm Vascular Assessment Pulses: Dorsalis Pedis Palpable: [Left:Yes] [Right:Yes] Posterior Tibial Extremity colors, hair growth, and conditions: Extremity Color: [Left:Hyperpigmented] [Right:Hyperpigmented] Hair Growth on Extremity: [Left:No] [Right:No] Temperature of Extremity: [Left:Warm] [Right:Warm] Capillary Refill: [Left:< 3 seconds] [Right:< 3 seconds] Toe Nail Assessment Left: Right: Thick: Yes Yes Discolored: Yes Yes Deformed: Yes Yes Improper Length and Hygiene: Yes Yes Electronic Signature(s) Signed: 07/16/2018 5:06:05 PM By: Gretta Cool, BSN, RN, CWS, Kim RN, BSN Entered By: Gretta Cool, BSN, RN, CWS, Kim on  07/16/2018 13:15:30 Adam Merritt, Adam Merritt ([id number]) -------------------------------------------------------------------------------- Multi Wound Chart Details Patient Name: Adam Merritt, Adam Merritt Date of Service: 07/16/2018 12:45 PM Medical Record Number: [id number] Patient Account Number: [id number] Date of Birth/Sex: 03-Oct-1945 (72 y.o. M) Treating RN: Montey Hora Primary Care Khaleb Broz: [name] Other Clinician: Referring Eldana Isip: [name] Treating Winnifred Dufford/Extender: Melburn Hake, [name]: 14 Vital Signs Height(in): 66 Pulse(bpm): 57 Weight(lbs): 323 Blood Pressure(mmHg): 177/67 Body Mass Index(BMI): 52 Temperature(F): 98.1 Respiratory Rate 18 (breaths/min): Photos: [18:No Photos] [N/A:N/A] Wound Location: [18:Left Lower Leg - Lateral] [N/A:N/A] Wounding Event: [18:Trauma] [N/A:N/A] Primary Etiology: [18:Trauma, Other] [N/A:N/A] Comorbid History: [18:Anemia, Lymphedema, Chronic Obstructive Pulmonary Disease (COPD), Sleep Apnea, Congestive Heart Failure, Coronary Artery Disease, Hypertension, Peripheral Venous Disease, Type II Diabetes, Osteoarthritis, Neuropathy, Received  Chemotherapy] [N/A:N/A] Date Acquired: [18:07/16/2018] [N/A:N/A] Weeks of Treatment: [18:0] [N/A:N/A] Wound Status: [18:Open] [N/A:N/A] Measurements L  x W x D [18:3.5x3x0.1] [N/A:N/A] (cm) Area (cm) : [18:8.247] [N/A:N/A] Volume (cm) : [18:0.825] [N/A:N/A] % Reduction in Area: [18:0.00%] [N/A:N/A] % Reduction in Volume: [18:0.00%] [N/A:N/A] Classification: [18:Partial Thickness] [N/A:N/A] Exudate Amount: [18:Large] [N/A:N/A] Exudate Type: [18:Serous] [N/A:N/A] Exudate Color: [18:amber] [N/A:N/A] Wound Margin: [18:Flat and Intact] [N/A:N/A] Granulation Amount: [18:Large (67-100%)] [N/A:N/A] Granulation Quality: [18:Red] [N/A:N/A] Necrotic Amount: [18:None Present (0%)] [N/A:N/A] Exposed Structures: [18:Fascia: No Fat Layer (Subcutaneous Tissue) Exposed: No Tendon: No Muscle: No Joint: No] [N/A:N/A] Bone: No Limited to Skin Breakdown Epithelialization: None N/A N/A Periwound Skin Texture: Excoriation: No N/A N/A Induration: No Callus: No Crepitus: No Rash: No Scarring: No Periwound Skin Moisture: Maceration: No N/A N/A Dry/Scaly: No Periwound Skin Color: Atrophie Blanche: No N/A N/A Cyanosis: No Ecchymosis: No Erythema: No Hemosiderin Staining: No Mottled: No Pallor: No Rubor: No Tenderness on Palpation: No N/A N/A Wound Preparation: Ulcer Cleansing: N/A N/A Rinsed/Irrigated with Saline Topical Anesthetic Applied: None Treatment Notes Electronic Signature(s) Signed: 07/16/2018 4:52:44 PM By: Montey Hora Entered By: Montey Hora on 07/16/2018 13:36:27 Adam Merritt ([id number]) -------------------------------------------------------------------------------- [place] Details Patient Name: Adam Merritt Date of Service: 07/16/2018 12:45 PM Medical Record Number: [id number] Patient Account Number: [id number] Date of Birth/Sex: December 10, 1945 (72 y.o. M) Treating RN: Montey Hora Primary Care Hyun Marsalis: [name] Other Clinician: Referring Shalay Carder: [name] Treating Koree Staheli/Extender: Melburn Hake, [name]: 69 Active Inactive Abuse / Safety / Falls / Self Care  Management Nursing Diagnoses: Potential for falls Goals: Patient will remain injury free related to falls Date Initiated: 04/09/2018 Target Resolution Date: 07/13/2018 Goal Status: Active Interventions: Assess fall risk on admission and as needed Notes: Orientation to the Wound Care Program Nursing Diagnoses: Knowledge deficit related to the wound healing center program Goals: Patient/caregiver will verbalize understanding of the [facility] Program Date Initiated: 04/09/2018 Target Resolution Date: 07/13/2018 Goal Status: Active Interventions: Provide education on orientation to the wound center Notes: Venous Leg Ulcer Nursing Diagnoses: Actual venous Insuffiency (use after diagnosis is confirmed) Goals: Patient will maintain optimal edema control Date Initiated: 04/09/2018 Target Resolution Date: 07/13/2018 Goal Status: Active Interventions: Assess peripheral edema status every visit. Adam Merritt, Adam Merritt ([id number]) Compression as ordered Provide education on venous insufficiency Notes: Electronic Signature(s) Signed: 07/16/2018 4:52:44 PM By: Montey Hora Entered By: Montey Hora on 07/16/2018 13:36:15 Adam Merritt ([id number]) -------------------------------------------------------------------------------- Non-Wound Condition Assessment Details Patient Name: Adam Merritt Date of Service: 07/16/2018 12:45 PM Medical Record Number: [id number] Patient Account Number: [id number] Date of Birth/Sex: December 02, 1945 (72 y.o. M) Treating RN: Cornell Barman Primary Care [name] [name]: [name] Other Clinician: Referring Riddhi Grether: [name] Treating Abdel Effinger/Extender: Joaquim Lai  III, [name]: 14 Non-Wound Condition: Condition: Lymphedema Location: Leg Side: Bilateral Photos Photo Uploaded By: Gretta Cool, BSN, RN, CWS, Kim on 07/16/2018 17:05:43 Periwound Skin Texture Texture Color No Abnormalities Noted: No No Abnormalities Noted: No Callus: No Atrophie Blanche:  No Crepitus: No Cyanosis: No Excoriation: No Ecchymosis: No Friable: No Erythema: No Induration: Yes Hemosiderin Staining: Yes Rash: No Mottled: No Scarring: No Pallor: No Rubor: No Moisture No Abnormalities Noted: No Temperature / Pain Dry / Scaly: No Temperature: No Abnormality Maceration: No Electronic Signature(s) Signed: 07/16/2018 5:04:04 PM By: Gretta Cool, BSN, RN, CWS, Kim RN, BSN Entered By: Gretta Cool, BSN, RN, CWS, Kim on 07/16/2018 17:04:04 Adam Merritt ([id number]) -------------------------------------------------------------------------------- Pain Assessment Details Patient Name: Adam Merritt Date of Service: 07/16/2018 12:45 PM Medical Record Number: [id number] Patient Account Number: [id number] Date of Birth/Sex: [birth year] (72 y.o. M) Treating RN: Montey Hora Primary Care Shima Compere: [name] Other Clinician: Referring Avel Ogawa: [name] Treating Latham Kinzler/Extender: Melburn Hake, [name]: 14 Active Problems Location of Pain Severity and Description of Pain Patient Has Paino Yes Site Locations Rate the pain. Current Pain Level: 7 Pain Management and Medication Current Pain Management: Electronic Signature(s) Signed: 07/16/2018 3:52:51 PM By: Lorine Bears RCP, RRT, CHT Signed: 07/16/2018 4:52:44 PM By: Montey Hora Entered By: Lorine Bears on 07/16/2018 12:57:19 Adam Merritt ([id number]) -------------------------------------------------------------------------------- Patient/Caregiver Education Details Patient Name: Adam Merritt Date of Service: 07/16/2018 12:45 PM Medical Record Number: [id number] Patient Account Number: [id number] Date of Birth/Gender: [birth year] (72 y.o. M) Treating RN: Montey Hora Primary Care Physician: [name] Other Clinician: Referring Physician: [name] Treating Physician/Extender: Sharalyn Ink in Treatment: 14 Education Assessment Education Provided  To: Patient and Caregiver Education Topics Provided Venous: Handouts: Other: leg elevation and keep wraps on Methods: Explain/Verbal Responses: State content correctly Electronic Signature(s) Signed: 07/16/2018 4:52:44 PM By: Montey Hora Entered By: Montey Hora on 07/16/2018 13:46:59 Adam Merritt ([id number]) -------------------------------------------------------------------------------- Wound Assessment Details Patient Name: Adam Merritt Date of Service: 07/16/2018 12:45 PM Medical Record Number: [id number] Patient Account Number: [id number] Date of Birth/Sex: June 28, 1945 (72 y.o. M) Treating RN: Cornell Barman Primary Care Yaziel Brandon: [name] Other Clinician: Referring Ryun Velez: [name] Treating Duglas Heier/Extender: Melburn Hake, [name]: 14 Wound Status Wound Number: 18 Primary Trauma, Other Etiology: Wound Location: Left Lower Leg - Lateral Wound Open Wounding Event: Trauma Status: Date Acquired: 07/16/2018 Comorbid Anemia, Lymphedema, Chronic Obstructive Weeks Of Treatment: 0 History: Pulmonary Disease (COPD), Sleep Apnea, Clustered Wound: No Congestive Heart Failure, Coronary Artery Disease, Hypertension, Peripheral Venous Disease, Type II Diabetes, Osteoarthritis, Neuropathy, Received Chemotherapy Photos Photo Uploaded By: Gretta Cool, BSN, RN, CWS, Kim on 07/16/2018 17:03:25 Wound Measurements Length: (cm) 3.5 % Reducti Width: (cm) 3 % Reducti Depth: (cm) 0.1 Epithelia Area: (cm) 8.247 Tunnelin Volume: (cm) 0.825 Undermin on in Area: 0% on in Volume: 0% lization: None g: No ing: No Wound Description Classification: Partial Thickness Foul Odor Wound Margin: Flat and Intact Slough/Fi Exudate Amount: Large Exudate Type: Serous Exudate Color: amber After Cleansing: No brino No Wound Bed Granulation Amount: Large (67-100%) Exposed Structure Granulation Quality: Red Fascia Exposed: No Necrotic Amount: None Present (0%) Fat Layer  (Subcutaneous Tissue) Exposed: No Tendon Exposed: No Muscle Exposed: No Joint Exposed: No Adam Merritt, Adam Merritt ([id number]) Bone Exposed: No Limited to Skin Breakdown Periwound Skin Texture Texture Color No Abnormalities Noted: No No Abnormalities Noted: No Callus: No Atrophie Blanche: No Crepitus: No Cyanosis: No Excoriation: No Ecchymosis: No Induration: No Erythema:  No Rash: No Hemosiderin Staining: No Scarring: No Mottled: No Pallor: No Moisture Rubor: No No Abnormalities Noted: No Dry / Scaly: No Maceration: No Wound Preparation Ulcer Cleansing: Rinsed/Irrigated with Saline Topical Anesthetic Applied: None Treatment Notes Wound #18 (Left, Lateral Lower Leg) 1. Cleansed with: Clean wound with Normal Saline 2. Anesthetic Topical Lidocaine 4% cream to wound bed prior to debridement Notes Silver Cell, ABD, 4 layer wrap Electronic Signature(s) Signed: 07/16/2018 5:06:05 PM By: Gretta Cool, BSN, RN, CWS, Kim RN, BSN Entered By: Gretta Cool, BSN, RN, CWS, Kim on 07/16/2018 13:12:12 Adam Merritt ([id number]) -------------------------------------------------------------------------------- [place] Details Patient Name: Adam Merritt Date of Service: 07/16/2018 12:45 PM Medical Record Number: [id number] Patient Account Number: [id number] Date of Birth/Sex: 03-04-1946 (72 y.o. M) Treating RN: Montey Hora Primary Care Jaleeah Slight: [name] Other Clinician: Referring [name] Agena: [name] Treating Jashawn Floyd/Extender: Melburn Hake, [name]: 14 Vital Signs Time Taken: 12:57 Temperature (F): 98.1 Height (in): 66 Pulse (bpm): 79 Weight (lbs): 323 Respiratory Rate (breaths/min): 18 Body Mass Index (BMI): 52.1 Blood Pressure (mmHg): 177/67 Reference Range: 80 - 120 mg / dl Airway Electronic Signature(s) Signed: 07/16/2018 3:52:51 PM By: Lorine Bears RCP, RRT, CHT Entered By: Lorine Bears on 07/16/2018 13:00:26

## 2018-07-21 NOTE — Progress Notes (Signed)
FLOY, RIEGLER ([id number]) Visit Report for 07/16/2018 Chief Complaint Document Details Patient Name: Adam Merritt, Adam Merritt Date of Service: 07/16/2018 12:45 PM Medical Record Number: [id number] Patient Account Number: [id number] Date of Birth/Sex: 05/14/1946 (73 y.o. M) Treating RN: Montey Hora Primary Care Provider: [name] Other Clinician: Referring Provider: [name] Treating Provider/Extender: Melburn Hake, [name]: 14 Information Obtained from: Patient Chief Complaint he is here for evaluation of bilateral lower extremity lymphedema right right toe and left LE ulcers Electronic Signature(s) Signed: 07/21/2018 7:15:48 AM By: Worthy Keeler PA-C Entered By: Worthy Keeler on 07/16/2018 13:04:24 [name] ([id number]) -------------------------------------------------------------------------------- HPI Details Patient Name: [name] Date of Service: 07/16/2018 12:45 PM Medical Record Number: [id number] Patient Account Number: [id number] Date of Birth/Sex: 1945-11-16 (73 y.o. M) Treating RN: Montey Hora Primary Care Provider: [name] Other Clinician: Referring Provider: [name] Treating Provider/Extender: Melburn Hake, [name]: 14 History of Present Illness HPI Description: 10/18/17-He is here for initial evaluation of bilateral lower extremity ulcerations in the presence of venous insufficiency and lymphedema. He has been seen by vascular medicine in the past, Dr. Lucky Cowboy, last seen in 2016. He does have a history of abnormal ABIs, which is to be expected given his lymphedema and venous insufficiency. According to Epic, it appears that all attempts for arterial evaluation and/or angiography were not follow through with by patient. He does have a history of being seen in lymphedema clinic in 2018, stopped going approximately 6 months ago stating "it didn't do any good". He does not have lymphedema pumps, he does not have custom fit  compression wrap/stockings. He is diabetic and his recent A1c last month was 7.6. He admits to chronic bilateral lower extremity pain, no change in pain since blister and ulceration development. He is currently being treated with Levaquin for bronchitis. He has home health and we will continue. 10/25/17-He is here in follow-up evaluation for bilateral lower extremity ulcerationssubtle he remains on Levaquin for bronchitis. Right lower extremity with no evidence of drainage or ulceration, persistent left lower extremity ulceration. He states that home health has not been out since his appointment. [facility], studies revealed: RIGHT ABI 0.9, TBI 0.6 LEFT ABI 1.1, TBI 0.6 with triphasic flow bilaterally. We will continue his same treatment plan. He has been educated on compression therapy and need for elevation. He will benefit from lymphedema pumps 11/01/17-He is here in follow-up evaluation for left lower extremity ulcer. The right lower extremity remains healed. He has home health services, but they have not been out to see the patient for 2-3 weeks. He states it home health physical therapy changed his dressing yesterday after therapy; he placed Ace wrap compression. We are still waiting for lymphedema pumps, reordered d/t need for company change. 11/08/17-He is here in follow-up evaluation for left lower extremity ulcer. It is improved. Edema is significantly improved with compression therapy. We will continue with same treatment plan and he will follow-up next week. No word regarding lymphedema pumps 11/15/17-He is here in follow-up evaluation for left lower extremity ulcer. He is healed and will be discharged from wound care services. I have reached out to [facility] regarding his lymphedema pumps. They have been unable to reach the patient; the contact number they had with the patient's wife's cell phone and she has not answered any unrecognized  calls. Contact should be made today, trial planned for next week; [facility] will continue to  follow 11/27/17 on evaluation today patient has multiple blistered areas over the right lower extremity his left lower extremity appears to be doing okay. These blistered areas show signs of no infection which is great news. With that being said he did have some necrotic skin overlying which was mechanically debrided away with saline and gauze today without complication. Overall post debridement the wounds appear to be doing better but in general his swelling seems to be increased. This is obviously not good news. I think this is what has given rise to the blisters. 12/04/17 on evaluation today patient presents for follow-up concerning his bilateral lower extremity edema in the right lower extremity ulcers. He has been tolerating the dressing changes without complication. With that being said he has had no real issues with the wraps which is also good news. Overall I'm pleased with the progress he's been making. 12/11/17 on evaluation today patient appears to be doing rather well in regard to his right lateral lower extremity ulcer. He's been tolerating the dressing changes without complication. Fortunately there does not appear to be any evidence of infection at this time. Overall I'm pleased with the progress that is being made. Unfortunately he has been in the hospital due to having what sounds to be a stomach virus/flu fortunately that is starting to get better. 12/18/17 on evaluation today patient actually appears to be doing very well in regard to his bilateral lower extremities the swelling is under fairly good control his lymphedema pumps are still not up and running quite yet. With that being said he does have several areas of opening noted as far as wounds are concerned mainly over the left lower extremity. With that being said I do believe once he gets lymphedema pumps this would least help you  mention some the fluid and preventing this from occurring. Hopefully that will be set up soon sleeves are Artie in place at his home he just waiting for the machine. 12/25/17 on evaluation today patient actually appears to be doing excellent in fact all of his ulcers appear to have resolved his BRITTANY, AMIRAULT. ([id number]) legs appear very well. I do think he needs compression stockings we have discussed this and they are actually going to go to [facility] today to elastic therapy to get this fitted for him. I think that is definitely a good thing to do. Readmission: 04/09/18 upon evaluation today this patient is seen for readmission due to bilateral lower extremity lymphedema. He has significant swelling of his extremities especially on the left although the right is also swollen he has weeping from both sides. There are no obvious open wounds at this point. Fortunately he has been doing fairly well for quite a bit of time since I last saw him. Nonetheless unfortunately this seems to have reopened and is giving quite a bit of trouble. He states this began about a week ago when he first called [place] to get in to be seen. No fevers, chills, nausea, or vomiting noted at this time. He has not been using his lymphedema pumps due to the fact that they won't fit on his leg at this point likewise is also not been using his compression for essentially the same reason. 04/16/18 upon evaluation today patient actually appears to be doing a little better in regard to the fluid in his bilateral lower extremities. With that being said he's had three falls since I saw him last week. He also states that he's been feeling very poorly. I was  concerned last week and feel like that the concern is still there as far as the congestion in his chest is concerned he seems to be breathing about the same as last week but again he states he's very weak he's not even able to walk further than from the chair to the door. His wife had  to buy a wheelchair just to be able to get them out of the house to get to the appointment today. This has me very concerned. 04/23/18 on evaluation today patient actually appears to be doing much better than last week's evaluation. At that time actually had to transport him to the ER via EMS and he subsequently was admitted for acute pulmonary edema, acute renal failure, and acute congestive heart failure. Fortunately he is doing much better. Apparently they did dialyze him and were able to take off roughly 35 pounds of fluid. Nonetheless he is feeling much better both in regard to his breathing and he's able to get around much better at this time compared to previous. Overall I'm very happy with how things are at this time. There does not appear to be any evidence of infection currently. No fevers, chills, nausea, or vomiting noted at this time. 04/30/2018 patient seen today for follow-up and management of bilateral lower extremity lymphedema. He did express being more sad today than usual due to the recent loss of his dog. He states that he has been compliant with using the lymphedema pumps. However he does admit a minute over the last 2-3 days he has not been using the pumps due to the recent loss of his dog. At this time there is no drainage or open wounds to his lower extremities. The left leg edema is measuring smaller today. Still has a significant amount of edema on bilateral lower extremities With dry flaky skin. He will be referred to the lymphedema clinic for further management. Will continue 3 layer compression wraps and follow-up in 1-2 weeks.Denies any pain, fever, chairs recently. No recent falls or injuries reported during this visit. 05/07/18 on evaluation today patient actually appears to be doing very well in regard to his lower extremities in general all things considered. With that being said he is having some pain in the legs just due to the amount of swelling. He does have an  area where he had a blister on the left lateral lower extremity this is open at this point other than that there's nothing else weeping at this time. 05/14/18 on evaluation today patient actually appears to be doing excellent all things considered in regard to his lower extremities. He still has a couple areas of weeping on each leg which has continued to be the issue for him. He does have an appointment with the lymphedema clinic although this isn't until February 2020. That was the earliest they had. In the meantime he has continued to tolerate the compression wraps without complication. 05/28/18 on evaluation today patient actually appears to be doing more poorly in regard to his left lower extremity where he has a wound open at this point. He also had a fall where he subsequently injured his right great toe which has led to an open wound at the site unfortunately. He has been tolerating the dressing changes without complication in general as far as the wraps are concerned that he has not been putting any dressing on the left 1st toe ulcer site. 06/11/18 on evaluation today patient appears to be doing much worse in regard to his  bilateral lower extremity ulcers. He has been tolerating the dressing changes without complication although his legs have not been wrapped more recently. Overall I am not very pleased with the way his legs appear. I do believe he needs to be back in compression wraps he still has not received his compression wraps from the [facility] as of yet. 06/18/18 on evaluation today patient actually appears to be doing significantly better than last time I saw him. He has been tolerating the compression wraps without complication in the circumferential ulcers especially appear to be doing much better. His toe ulcer on the right in regard to the great toe is better although not as good as the legs in my pinion. No fevers chills noted DANGELO, GUZZETTA ([id number]) 07/02/18 on evaluation  today patient appears to be doing much better in regard to his lower extremity ulcers. Unfortunately since I last saw him he's had the distal portion of his right great toe if he dated it sounds as if this actually went downhill very quickly. I had only seen him a few days prior and the toe did not appear to be infected at that point subsequently became infected very rapidly and it was decided by the surgeon that the distal portion of the toe needed to be removed. The patient seems to be doing well in this regard he tells me. With that being said his lower extremities are doing better from the standpoint of the wounds although he is significantly swollen at this point. 07/09/18 on evaluation today patient appears to be doing better in regard to the wounds on his lower extremities. In fact everything is almost completely healed he is just a small area on the left posterior lower extremity that is open at this point. 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 With that being said he needs some kind of compression it does sound like he may not be wearing his compression, that is the wraps, during the entire time between when he's here visit to visit. Apparently his wife took the current one off because it began to "fall apart". 07/16/18 on evaluation today patient appears to be extremely swollen especially in regard to his left lower extremity unfortunately. He also has a new skin tear over the left lower extremity and there's a smaller area on the right lower extremity as well. Unfortunately this seems to be due in part to blistering and fluid buildup in his leg. He did get the reduction wraps that were ordered by the [facility] hospital for him to go to lymphedema clinic. With that being said his wounds on the legs have not healed  to the point to where they would likely accept them as a patient lymphedema clinic currently. We need to try to get this to heal. With that being said he's been taking his wraps off which is not doing him any favors at this point. In fact this is probably quite counterproductive compared to what needs to occur. We will likely need to increase to a four layer compression wrap and continue to also utilize elevation and he has to keep the wraps on not take them off as he's been doing currently hasn't had a wrap on since Saturday. Electronic Signature(s) Signed: 07/21/2018 7:15:48 AM By: Worthy Keeler PA-C Entered By: Worthy Keeler on 07/16/2018  14:59:15 JAMARKUS, LISBON ([id number]) -------------------------------------------------------------------------------- Physical Exam Details Patient Name: BRAELYNN, LUPTON Date of Service: 07/16/2018 12:45 PM Medical Record Number: [id number] Patient Account Number: [id number] Date of Birth/Sex: 1946-04-07 (73 y.o. M) Treating RN: Montey Hora Primary Care Provider: [name] Other Clinician: Referring Provider: [name] Treating Provider/Extender: STONE III, [name]: 14 Constitutional Obese and well-hydrated in no acute distress. Respiratory normal breathing without difficulty. clear to auscultation bilaterally. Cardiovascular regular rate and rhythm with normal S1, S2. Psychiatric this patient is able to make decisions and demonstrates good insight into disease process. Alert and Oriented x 3. pleasant and cooperative. Notes Patient's wound bed currently shows signs of being rather superficial which is good news this is in regard to both lower extremities. With that being said he has significant the bilateral lymphedema but this is tremendously worse on the left compared to the right unfortunately. No sharp debridement was performed today incidentally his right great toe invitation site appears to be doing very  well. Electronic Signature(s) Signed: 07/21/2018 7:15:48 AM By: Worthy Keeler PA-C Entered By: Worthy Keeler on 07/16/2018 14:59:46 [name] ([id number]) -------------------------------------------------------------------------------- Physician Orders Details Patient Name: [name] Date of Service: 07/16/2018 12:45 PM Medical Record Number: [id number] Patient Account Number: [id number] Date of Birth/Sex: 1945/06/07 (73 y.o. M) Treating RN: Montey Hora Primary Care Provider: [name] Other Clinician: Referring Provider: [name] Treating Provider/Extender: Melburn Hake, [name]: 25 Verbal / Phone Orders: No Diagnosis Coding ICD-10 Coding Code Description I89.0 Lymphedema, not elsewhere classified L97.512 Non-pressure chronic ulcer of other part of right foot with fat layer exposed L97.822 Non-pressure chronic ulcer of other part of left lower leg with fat layer exposed L98.492 Non-pressure chronic ulcer of skin of other sites with fat layer exposed E66.9 Obesity, unspecified Wound Cleansing o Cleanse wound with mild soap and water Skin Barriers/Peri-Wound Care o Moisturizing lotion - to both lower legs Primary Wound Dressing Wound #18 Left,Lateral Lower Leg o Silver Alginate Secondary Dressing Wound #18 Left,Lateral Lower Leg o ABD pad - to any areas of draining lymphedema Dressing Change Frequency Wound #18 Left,Lateral Lower Leg o Other: - Twice weekly - nurse visit on Friday Follow-up Appointments Wound #18 Left,Lateral Lower Leg o Return Appointment in 1 week. - nurse visit on Friday o Nurse Visit as needed Edema Control Wound #18 Left,Lateral Lower Leg o 4 Layer Compression System - Bilateral Electronic Signature(s) Signed: 07/16/2018 4:52:44 PM By: Montey Hora Signed: 07/21/2018 7:15:48 AM By: Worthy Keeler PA-C Entered By: Montey Hora on 07/16/2018 13:44:53 [name] ([id number]) XIONG, HAIDAR  ([id number]) -------------------------------------------------------------------------------- Problem List Details Patient Name: [name] Date of Service: 07/16/2018 12:45 PM Medical Record Number: [id number] Patient Account Number: [id number] Date of Birth/Sex: 30-Nov-1945 (73 y.o. M) Treating RN: Montey Hora Primary Care Provider: [name] Other Clinician: Referring Provider: [name] Treating Provider/Extender: Melburn Hake, [name]: 14 Active Problems ICD-10 Evaluated Encounter Code Description Active Date Today Diagnosis I89.0 Lymphedema, not elsewhere classified 04/09/2018 No Yes L97.512 Non-pressure chronic ulcer of other part of right foot with fat 05/28/2018 No Yes layer exposed L97.822 Non-pressure chronic ulcer of other part of left lower leg with 05/28/2018 No Yes fat layer exposed L98.492 Non-pressure chronic ulcer of skin of other sites with fat layer 06/11/2018 No Yes exposed E66.9 Obesity, unspecified 04/09/2018 No Yes Inactive Problems Resolved Problems ICD-10 Code Description Active Date Resolved Date L03.115 Cellulitis of right lower limb 04/09/2018 04/09/2018 L03.116  Cellulitis of left lower limb 04/09/2018 04/09/2018 Electronic Signature(s) Signed: 07/21/2018 7:15:48 AM By: Worthy Keeler PA-C Entered By: Worthy Keeler on 07/16/2018 13:04:19 [name] ([id number]) ELGIN, CARN ([id number]) -------------------------------------------------------------------------------- Progress Note Details Patient Name: [name] Date of Service: 07/16/2018 12:45 PM Medical Record Number: [id number] Patient Account Number: [id number] Date of Birth/Sex: 1946/01/31 (73 y.o. M) Treating RN: Montey Hora Primary Care Provider: [name] Other Clinician: Referring Provider: [name] Treating Provider/Extender: Melburn Hake, [name]: 14 Subjective Chief Complaint Information obtained from Patient he is here for  evaluation of bilateral lower extremity lymphedema right right toe and left LE ulcers History of Present Illness (HPI) 10/18/17-He is here for initial evaluation of bilateral lower extremity ulcerations in the presence of venous insufficiency and lymphedema. He has been seen by vascular medicine in the past, Dr. Lucky Cowboy, last seen in 2016. He does have a history of abnormal ABIs, which is to be expected given his lymphedema and venous insufficiency. According to Epic, it appears that all attempts for arterial evaluation and/or angiography were not follow through with by patient. He does have a history of being seen in lymphedema clinic in 2018, stopped going approximately 6 months ago stating "it didn't do any good". He does not have lymphedema pumps, he does not have custom fit compression wrap/stockings. He is diabetic and his recent A1c last month was 7.6. He admits to chronic bilateral lower extremity pain, no change in pain since blister and ulceration development. He is currently being treated with Levaquin for bronchitis. He has home health and we will continue. 10/25/17-He is here in follow-up evaluation for bilateral lower extremity ulcerationssubtle he remains on Levaquin for bronchitis. Right lower extremity with no evidence of drainage or ulceration, persistent left lower extremity ulceration. He states that home health has not been out since his appointment. He went to [facility] on Tuesday, studies revealed: RIGHT ABI 0.9, TBI 0.6 LEFT ABI 1.1, TBI 0.6 with triphasic flow bilaterally. We will continue his same treatment plan. He has been educated on compression therapy and need for elevation. He will benefit from lymphedema pumps 11/01/17-He is here in follow-up evaluation for left lower extremity ulcer. The right lower extremity remains healed. He has home health services, but they have not been out to see the patient for 2-3 weeks. He states it home health physical  therapy changed his dressing yesterday after therapy; he placed Ace wrap compression. We are still waiting for lymphedema pumps, reordered d/t need for company change. 11/08/17-He is here in follow-up evaluation for left lower extremity ulcer. It is improved. Edema is significantly improved with compression therapy. We will continue with same treatment plan and he will follow-up next week. No word regarding lymphedema pumps 11/15/17-He is here in follow-up evaluation for left lower extremity ulcer. He is healed and will be discharged from wound care services. I have reached out to [facility] regarding his lymphedema pumps. They have been unable to reach the patient; the contact number they had with the patient's wife's cell phone and she has not answered any unrecognized calls. Contact should be made today, trial planned for next week; [facility] will continue to follow 11/27/17 on evaluation today patient has multiple blistered areas over the right lower extremity his left lower extremity appears to be doing okay. These blistered areas show signs of no infection which is great news. With that being said he did have some necrotic skin overlying which was mechanically  debrided away with saline and gauze today without complication. Overall post debridement the wounds appear to be doing better but in general his swelling seems to be increased. This is obviously not good news. I think this is what has given rise to the blisters. 12/04/17 on evaluation today patient presents for follow-up concerning his bilateral lower extremity edema in the right lower extremity ulcers. He has been tolerating the dressing changes without complication. With that being said he has had no real issues with the wraps which is also good news. Overall I'm pleased with the progress he's been making. 12/11/17 on evaluation today patient appears to be doing rather well in regard to his right lateral lower extremity ulcer.  He's been tolerating the dressing changes without complication. Fortunately there does not appear to be any evidence of infection at this time. Overall I'm pleased with the progress that is being made. Unfortunately he has been in the hospital due to having what sounds to be a stomach virus/flu fortunately that is starting to get better. 12/18/17 on evaluation today patient actually appears to be doing very well in regard to his bilateral lower extremities the PATTY, LOPEZGARCIA. ([id number]) swelling is under fairly good control his lymphedema pumps are still not up and running quite yet. With that being said he does have several areas of opening noted as far as wounds are concerned mainly over the left lower extremity. With that being said I do believe once he gets lymphedema pumps this would least help you mention some the fluid and preventing this from occurring. Hopefully that will be set up soon sleeves are Artie in place at his home he just waiting for the machine. 12/25/17 on evaluation today patient actually appears to be doing excellent in fact all of his ulcers appear to have resolved his legs appear very well. I do think he needs compression stockings we have discussed this and they are actually going to go to [place] today to elastic therapy to get this fitted for him. I think that is definitely a good thing to do. Readmission: 04/09/18 upon evaluation today this patient is seen for readmission due to bilateral lower extremity lymphedema. He has significant swelling of his extremities especially on the left although the right is also swollen he has weeping from both sides. There are no obvious open wounds at this point. Fortunately he has been doing fairly well for quite a bit of time since I last saw him. Nonetheless unfortunately this seems to have reopened and is giving quite a bit of trouble. He states this began about a week ago when he first called [place] to get in to be seen. No fevers,  chills, nausea, or vomiting noted at this time. He has not been using his lymphedema pumps due to the fact that they won't fit on his leg at this point likewise is also not been using his compression for essentially the same reason. 04/16/18 upon evaluation today patient actually appears to be doing a little better in regard to the fluid in his bilateral lower extremities. With that being said he's had three falls since I saw him last week. He also states that he's been feeling very poorly. I was concerned last week and feel like that the concern is still there as far as the congestion in his chest is concerned he seems to be breathing about the same as last week but again he states he's very weak he's not even able to walk further than from  the chair to the door. His wife had to buy a wheelchair just to be able to get them out of the house to get to the appointment today. This has me very concerned. 04/23/18 on evaluation today patient actually appears to be doing much better than last week's evaluation. At that time actually had to transport him to the ER via EMS and he subsequently was admitted for acute pulmonary edema, acute renal failure, and acute congestive heart failure. Fortunately he is doing much better. Apparently they did dialyze him and were able to take off roughly 35 pounds of fluid. Nonetheless he is feeling much better both in regard to his breathing and he's able to get around much better at this time compared to previous. Overall I'm very happy with how things are at this time. There does not appear to be any evidence of infection currently. No fevers, chills, nausea, or vomiting noted at this time. 04/30/2018 patient seen today for follow-up and management of bilateral lower extremity lymphedema. He did express being more sad today than usual due to the recent loss of his dog. He states that he has been compliant with using the lymphedema pumps. However he does admit a minute  over the last 2-3 days he has not been using the pumps due to the recent loss of his dog. At this time there is no drainage or open wounds to his lower extremities. The left leg edema is measuring smaller today. Still has a significant amount of edema on bilateral lower extremities With dry flaky skin. He will be referred to the lymphedema clinic for further management. Will continue 3 layer compression wraps and follow-up in 1-2 weeks.Denies any pain, fever, chairs recently. No recent falls or injuries reported during this visit. 05/07/18 on evaluation today patient actually appears to be doing very well in regard to his lower extremities in general all things considered. With that being said he is having some pain in the legs just due to the amount of swelling. He does have an area where he had a blister on the left lateral lower extremity this is open at this point other than that there's nothing else weeping at this time. 05/14/18 on evaluation today patient actually appears to be doing excellent all things considered in regard to his lower extremities. He still has a couple areas of weeping on each leg which has continued to be the issue for him. He does have an appointment with the lymphedema clinic although this isn't until February 2020. That was the earliest they had. In the meantime he has continued to tolerate the compression wraps without complication. 05/28/18 on evaluation today patient actually appears to be doing more poorly in regard to his left lower extremity where he has a wound open at this point. He also had a fall where he subsequently injured his right great toe which has led to an open wound at the site unfortunately. He has been tolerating the dressing changes without complication in general as far as the wraps are concerned that he has not been putting any dressing on the left 1st toe ulcer site. 06/11/18 on evaluation today patient appears to be doing much worse in regard to  his bilateral lower extremity ulcers. He has been tolerating the dressing changes without complication although his legs have not been wrapped more recently. Overall I am not very pleased with the way his legs appear. I do believe he needs to be back in compression wraps he still has not  received his compression wraps from the [facility] hospital as of yet. JAYRON, MAQUEDA ([id number]) 06/18/18 on evaluation today patient actually appears to be doing significantly better than last time I saw him. He has been tolerating the compression wraps without complication in the circumferential ulcers especially appear to be doing much better. His toe ulcer on the right in regard to the great toe is better although not as good as the legs in my pinion. No fevers chills noted 07/02/18 on evaluation today patient appears to be doing much better in regard to his lower extremity ulcers. Unfortunately since I last saw him he's had the distal portion of his right great toe if he dated it sounds as if this actually went downhill very quickly. I had only seen him a few days prior and the toe did not appear to be infected at that point subsequently became infected very rapidly and it was decided by the surgeon that the distal portion of the toe needed to be removed. The patient seems to be doing well in this regard he tells me. With that being said his lower extremities are doing better from the standpoint of the wounds although he is significantly swollen at this point. 07/09/18 on evaluation today patient appears to be doing better in regard to the wounds on his lower extremities. In fact everything is almost completely healed he is just a small area on the left posterior lower extremity that is open at this point. He is actually seeing the doctor tomorrow regarding his toe amputation and possibly having the sutures removed that point until this is complete he cannot see the lymphedema clinic apparently according to what he is  being told. With that being said he needs some kind of compression it does sound like he may not be wearing his compression, that is the wraps, during the entire time between when he's here visit to visit. Apparently his wife took the current one off because it began to "fall apart". 07/16/18 on evaluation today patient appears to be extremely swollen especially in regard to his left lower extremity unfortunately. He also has a new skin tear over the left lower extremity and there's a smaller area on the right lower extremity as well. Unfortunately this seems to be due in part to blistering and fluid buildup in his leg. He did get the reduction wraps that were ordered by the [facility] hospital for him to go to lymphedema clinic. With that being said his wounds on the legs have not healed to the point to where they would likely accept them as a patient lymphedema clinic currently. We need to try to get this to heal. With that being said he's been taking his wraps off which is not doing him any favors at this point. In fact this is probably quite counterproductive compared to what needs to occur. We will likely need to increase to a four layer compression wrap and continue to also utilize elevation and he has to keep the wraps on not take them off as he's been doing currently hasn't had a wrap on since Saturday. Patient History Information obtained from Patient. Family History Cancer - Paternal Grandparents, Kidney Disease - Siblings, Lung Disease - Mother, No family history of Diabetes, Heart Disease, Hereditary Spherocytosis, Hypertension, Seizures, Stroke, Thyroid Problems, Tuberculosis. Social History Never smoker, Marital Status - Married, Alcohol Use - Never, Drug Use - No History, Caffeine Use - Never. Medical History Eyes Denies history of Cataracts, Glaucoma, Optic Neuritis Ear/Nose/Mouth/Throat Denies  history of Chronic sinus problems/congestion, Middle ear  problems Hematologic/Lymphatic Patient has history of Anemia - aplastic anemia, Lymphedema Denies history of Hemophilia, Human Immunodeficiency Virus, Sickle Cell Disease Respiratory Patient has history of Chronic Obstructive Pulmonary Disease (COPD), Sleep Apnea Denies history of Aspiration, Asthma, Pneumothorax, Tuberculosis Cardiovascular Patient has history of Congestive Heart Failure, Coronary Artery Disease, Hypertension, Peripheral Venous Disease Denies history of Angina, Arrhythmia, Deep Vein Thrombosis, Hypotension, Myocardial Infarction, Peripheral Arterial Disease, Phlebitis, Vasculitis Gastrointestinal Denies history of Cirrhosis , Colitis, Crohn s, Hepatitis A, Hepatitis B, Hepatitis C Endocrine CANIO, WINOKUR ([id number]) Patient has history of Type II Diabetes Denies history of Type I Diabetes Genitourinary Denies history of End Stage Renal Disease Immunological Denies history of Lupus Erythematosus, Raynaud s, Scleroderma Integumentary (Skin) Denies history of History of Burn, History of pressure wounds Musculoskeletal Patient has history of Osteoarthritis Denies history of Gout, Rheumatoid Arthritis, Osteomyelitis Neurologic Patient has history of Neuropathy Denies history of Dementia, Quadriplegia, Paraplegia, Seizure Disorder Oncologic Patient has history of Received Chemotherapy - last dose 2006 Denies history of Received Radiation Psychiatric Denies history of Anorexia/bulimia, Confinement Anxiety Hospitalization/Surgery History - 11/03/2013, [facility], bronchitis. Medical And Surgical History Notes Respiratory home O2 at night as needed Cardiovascular varicose veins Neurologic CVA - 1992 Review of Systems (ROS) Constitutional Symptoms (General Health) Denies complaints or symptoms of Fever, Chills. Respiratory The patient has no complaints or symptoms. Cardiovascular Complains or has symptoms of LE edema. Psychiatric The patient has no complaints or  symptoms. Objective Constitutional Obese and well-hydrated in no acute distress. Vitals Time Taken: 12:57 PM, Height: 66 in, Weight: 323 lbs, BMI: 52.1, Temperature: 98.1 F, Pulse: 79 bpm, Respiratory Rate: 18 breaths/min, Blood Pressure: 177/67 mmHg. Respiratory normal breathing without difficulty. clear to auscultation bilaterally. JOSHAWA, DUBIN ([id number]) Cardiovascular regular rate and rhythm with normal S1, S2. Psychiatric this patient is able to make decisions and demonstrates good insight into disease process. Alert and Oriented x 3. pleasant and cooperative. General Notes: Patient's wound bed currently shows signs of being rather superficial which is good news this is in regard to both lower extremities. With that being said he has significant the bilateral lymphedema but this is tremendously worse on the left compared to the right unfortunately. No sharp debridement was performed today incidentally his right great toe invitation site appears to be doing very well. Integumentary (Hair, Skin) Wound #18 status is Open. Original cause of wound was Trauma. The wound is located on the Left,Lateral Lower Leg. The wound measures 3.5cm length x 3cm width x 0.1cm depth; 8.247cm^2 area and 0.825cm^3 volume. The wound is limited to skin breakdown. There is no tunneling or undermining noted. There is a large amount of serous drainage noted. The wound margin is flat and intact. There is large (67-100%) red granulation within the wound bed. There is no necrotic tissue within the wound bed. The periwound skin appearance did not exhibit: Callus, Crepitus, Excoriation, Induration, Rash, Scarring, Dry/Scaly, Maceration, Atrophie Blanche, Cyanosis, Ecchymosis, Hemosiderin Staining, Mottled, Pallor, Rubor, Erythema. 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 There was a Four Layer Compression Therapy Procedure with a pre-treatment ABI of 1.1 by  Montey Hora, RN. Post procedure Diagnosis Wound #18: Same as Pre-Procedure Plan Wound Cleansing: Cleanse wound with mild soap and water Skin Barriers/Peri-Wound Care: METE, PURDUM ([id number]) Moisturizing lotion - to both lower legs Primary Wound Dressing: Wound #18 Left,Lateral Lower Leg: Silver Alginate Secondary Dressing: Wound #18 Left,Lateral Lower Leg: ABD pad - to any areas of draining lymphedema Dressing Change Frequency: Wound #18 Left,Lateral Lower Leg: Other: - Twice weekly - nurse visit on Friday Follow-up Appointments: Wound #18 Left,Lateral Lower Leg: Return Appointment in 1 week. - nurse visit on Friday Nurse Visit as needed Edema Control: Wound #18 Left,Lateral Lower Leg: 4 Layer Compression System - Bilateral My suggestion at this time is gonna be that we go ahead and initiate the above wound care measures for the next week. Patient is in agreement the plan. We will subsequently see were things stand at follow-up. If anything changes or worsens will let me know we are gonna have him changes wraps here in the clinic since he's been apparently discharged from home health having run out of visits as best we can tell. Nonetheless he needs to keep the wraps on and let us wash his legs here in the clinic I do not want him taking them off at home because this is gonna be counterproductive. He voiced understanding. We will see were things stand on Friday when we see him for the first wrap change and then we will subsequently see him back next Tuesday for a evaluation with myself as well. Please see above for specific  wound care orders. We will see patient for re-evaluation in 1 week(s) here in the clinic. If anything worsens or changes patient will contact our office for additional recommendations. Electronic Signature(s) Signed: 07/21/2018 7:15:48 AM By: Worthy Keeler PA-C Entered By: Worthy Keeler on 07/16/2018 15:00:38 [name] ([id number]) -------------------------------------------------------------------------------- ROS/PFSH Details Patient Name: [name] Date of Service: 07/16/2018 12:45 PM Medical Record Number: [id number] Patient Account Number: [id number] Date of Birth/Sex: 04/29/1946 (73 y.o. M) Treating RN: Montey Hora Primary Care Provider: [name] Other Clinician: Referring Provider: [name] Treating Provider/Extender: Melburn Hake, [name]: 14 Information Obtained From Patient Wound History Do you currently have one or more open woundso No Have you tested positive for osteomyelitis (bone infection)o No Have you had any tests for circulation on your legso Yes Constitutional Symptoms (General Health) Complaints and Symptoms: Negative for: Fever; Chills Cardiovascular Complaints and Symptoms: Positive for: LE edema Medical History: Positive for: Congestive Heart Failure; Coronary Artery Disease; Hypertension; Peripheral Venous Disease Negative for: Angina; Arrhythmia; Deep Vein Thrombosis; Hypotension; Myocardial Infarction; Peripheral Arterial Disease; Phlebitis; Vasculitis Past Medical History Notes: varicose veins Eyes Medical History: Negative for: Cataracts; Glaucoma; Optic Neuritis Ear/Nose/Mouth/Throat Medical History: Negative for: Chronic sinus problems/congestion; Middle ear problems Hematologic/Lymphatic Medical History: Positive for: Anemia - aplastic anemia; Lymphedema Negative for: Hemophilia; Human Immunodeficiency Virus; Sickle Cell Disease Respiratory Complaints and Symptoms: No Complaints or Symptoms Medical  History: Positive for: Chronic Obstructive Pulmonary Disease (COPD); Sleep Apnea Negative for: Aspiration; Asthma; Pneumothorax; Tuberculosis Past Medical History Notes: LIMUEL, NIEBLAS ([id number]) home O2 at night as needed Gastrointestinal Medical History: Negative for: Cirrhosis ; Colitis; Crohnos; Hepatitis A; Hepatitis B; Hepatitis C Endocrine Medical History: Positive for: Type II Diabetes Negative for: Type I Diabetes Time with diabetes: since 2006 Treated with: Insulin Blood sugar tested every day: Yes Tested : Blood sugar testing results: Breakfast: 209 Genitourinary Medical History: Negative for: End Stage Renal Disease Immunological Medical  History: Negative for: Lupus Erythematosus; Raynaudos; Scleroderma Integumentary (Skin) Medical History: Negative for: History of Burn; History of pressure wounds Musculoskeletal Medical History: Positive for: Osteoarthritis Negative for: Gout; Rheumatoid Arthritis; Osteomyelitis Neurologic Medical History: Positive for: Neuropathy Negative for: Dementia; Quadriplegia; Paraplegia; Seizure Disorder Past Medical History Notes: CVA - 1992 Oncologic Medical History: Positive for: Received Chemotherapy - last dose 2006 Negative for: Received Radiation Psychiatric Complaints and Symptoms: No Complaints or Symptoms Medical History: VAUGHN, BEAUMIER ([id number]) Negative for: Anorexia/bulimia; Confinement Anxiety Immunizations Pneumococcal Vaccine: Received Pneumococcal Vaccination: Yes Implantable Devices Hospitalization / Surgery History Name of Hospital Purpose of Hospitalization/[place] Date [facility] bronchitis 11/03/2013 Family and Social History Cancer: Yes - Paternal Grandparents; Diabetes: No; Heart Disease: No; Hereditary Spherocytosis: No; Hypertension: No; Kidney Disease: Yes - Siblings; Lung Disease: Yes - Mother; Seizures: No; Stroke: No; Thyroid Problems: No; Tuberculosis: No; Never smoker; Marital Status -  Married; Alcohol Use: Never; Drug Use: No History; Caffeine Use: Never; Financial Concerns: No; Food, Clothing or Shelter Needs: No; Support System Lacking: No; Transportation Concerns: No; Advanced Directives: No; Patient does not want information on Advanced Directives Physician Affirmation I have reviewed and agree with the above information. Electronic Signature(s) Signed: 07/16/2018 4:52:44 PM By: Montey Hora Signed: 07/21/2018 7:15:48 AM By: Worthy Keeler PA-C Entered By: Worthy Keeler on 07/16/2018 14:59:30 [name] ([id number]) -------------------------------------------------------------------------------- SuperBill Details Patient Name: [name] Date of Service: 07/16/2018 Medical Record Number: [id number] Patient Account Number: [id number] Date of Birth/Sex: Aug 16, 1945 (73 y.o. M) Treating RN: Montey Hora Primary Care Provider: [name] Other Clinician: Referring Provider: [name] Treating Provider/Extender: Melburn Hake, [name]: 14 Diagnosis Coding ICD-10 Codes Code Description I89.0 Lymphedema, not elsewhere classified L97.512 Non-pressure chronic ulcer of other part of right foot with fat layer exposed L97.822 Non-pressure chronic ulcer of other part of left lower leg with fat layer exposed L98.492 Non-pressure chronic ulcer of skin of other sites with fat layer exposed E66.9 Obesity, unspecified Facility Procedures CPT4: Description Modifier Quantity Code [id number] BILATERAL: Application of multi-layer venous compression system; leg (below 1 knee), including ankle and foot. Physician Procedures CPT4 Code Description: [id number] - WC PHYS LEVEL 4 - EST PT ICD-10 Diagnosis Description I89.0 Lymphedema, not elsewhere classified L97.512 Non-pressure chronic ulcer of other part of right foot with fat L97.822 Non-pressure chronic ulcer of other  part of left lower leg with L98.492 Non-pressure chronic ulcer of skin of other  sites with fat layer Modifier: layer exposed fat layer expos exposed Quantity: 1 ed Electronic Signature(s) Signed: 07/21/2018 7:15:48 AM By: Worthy Keeler PA-C Entered By: Worthy Keeler on 07/16/2018 15:00:52

## 2018-07-22 ENCOUNTER — Encounter: Payer: Medicare Other | Admitting: Occupational Therapy

## 2018-07-22 ENCOUNTER — Other Ambulatory Visit: Payer: Self-pay

## 2018-07-22 NOTE — Telephone Encounter (Signed)
Gave verbal approval for make up visit from last week missed appt [phone]

## 2018-07-23 ENCOUNTER — Encounter: Payer: Medicare Other | Admitting: Physician Assistant

## 2018-07-23 DIAGNOSIS — E11621 Type 2 diabetes mellitus with foot ulcer: Secondary | ICD-10-CM | POA: Diagnosis not present

## 2018-07-25 NOTE — Progress Notes (Signed)
BRAY, VICKERMAN ([id number]) Visit Report for 07/23/2018 Chief Complaint Document Details Patient Name: Adam Merritt, Adam Merritt Date of Service: 07/23/2018 1:45 PM Medical Record Number: [id number] Patient Account Number: [id number] Date of Birth/Sex: [birth year] (72 y.o. M) Treating RN: Montey Hora Primary Care Provider: [name] Other Clinician: Referring Provider: [name] Treating Provider/Extender: Melburn Hake, [name]: 15 Information Obtained from: Patient Chief Complaint he is here for evaluation of bilateral lower extremity lymphedema right right toe and left LE ulcers Electronic Signature(s) Signed: 07/23/2018 5:20:18 PM By: Worthy Keeler PA-C Entered By: Worthy Keeler on 07/23/2018 14:36:26 Adam Merritt ([id number]) -------------------------------------------------------------------------------- HPI Details Patient Name: Adam Merritt Date of Service: 07/23/2018 1:45 PM Medical Record Number: [id number] Patient Account Number: [id number] Date of Birth/Sex: 1945-08-25 (72 y.o. M) Treating RN: Montey Hora Primary Care Provider: [name] Other Clinician: Referring Provider: [name] Treating Provider/Extender: Melburn Hake, [name]: 15 History of Present Illness HPI Description: 10/18/17-He is here for initial evaluation of bilateral lower extremity ulcerations in the presence of venous insufficiency and lymphedema. He has been seen by vascular medicine in the past, Dr. Lucky Cowboy, last seen in 2016. He does have a history of abnormal ABIs, which is to be expected given his lymphedema and venous insufficiency. According to Epic, it appears that all attempts for arterial evaluation and/or angiography were not follow through with by patient. He does have a history of being seen in lymphedema clinic in 2018, stopped going approximately 6 months ago stating "it didn't do any good". He does not have lymphedema pumps, he does not have custom fit  compression wrap/stockings. He is diabetic and his recent A1c last month was 7.6. He admits to chronic bilateral lower extremity pain, no change in pain since blister and ulceration development. He is currently being treated with Levaquin for bronchitis. He has home health and we will continue. 10/25/17-He is here in follow-up evaluation for bilateral lower extremity ulcerationssubtle he remains on Levaquin for bronchitis. Right lower extremity with no evidence of drainage or ulceration, persistent left lower extremity ulceration. He states that home health has not been out since his appointment. He went to [facility] on Tuesday, studies revealed: RIGHT ABI 0.9, TBI 0.6 LEFT ABI 1.1, TBI 0.6 with triphasic flow bilaterally. We will continue his same treatment plan. He has been educated on compression therapy and need for elevation. He will benefit from lymphedema pumps 11/01/17-He is here in follow-up evaluation for left lower extremity ulcer. The right lower extremity remains healed. He has home health services, but they have not been out to see the patient for 2-3 weeks. He states it home health physical therapy changed his dressing yesterday after therapy; he placed Ace wrap compression. We are still waiting for lymphedema pumps, reordered d/t need for company change. 11/08/17-He is here in follow-up evaluation for left lower extremity ulcer. It is improved. Edema is significantly improved with compression therapy. We will continue with same treatment plan and he will follow-up next week. No word regarding lymphedema pumps 11/15/17-He is here in follow-up evaluation for left lower extremity ulcer. He is healed and will be discharged from wound care services. I have reached out to [facility] regarding his lymphedema pumps. They have been unable to reach the patient; the contact number they had with the patient's wife's cell phone and she has not answered any unrecognized  calls. Contact should be made today, trial planned for next week; [facility] will continue to  follow 11/27/17 on evaluation today patient has multiple blistered areas over the right lower extremity his left lower extremity appears to be doing okay. These blistered areas show signs of no infection which is great news. With that being said he did have some necrotic skin overlying which was mechanically debrided away with saline and gauze today without complication. Overall post debridement the wounds appear to be doing better but in general his swelling seems to be increased. This is obviously not good news. I think this is what has given rise to the blisters. 12/04/17 on evaluation today patient presents for follow-up concerning his bilateral lower extremity edema in the right lower extremity ulcers. He has been tolerating the dressing changes without complication. With that being said he has had no real issues with the wraps which is also good news. Overall I'm pleased with the progress he's been making. 12/11/17 on evaluation today patient appears to be doing rather well in regard to his right lateral lower extremity ulcer. He's been tolerating the dressing changes without complication. Fortunately there does not appear to be any evidence of infection at this time. Overall I'm pleased with the progress that is being made. Unfortunately he has been in the hospital due to having what sounds to be a stomach virus/flu fortunately that is starting to get better. 12/18/17 on evaluation today patient actually appears to be doing very well in regard to his bilateral lower extremities the swelling is under fairly good control his lymphedema pumps are still not up and running quite yet. With that being said he does have several areas of opening noted as far as wounds are concerned mainly over the left lower extremity. With that being said I do believe once he gets lymphedema pumps this would least help you  mention some the fluid and preventing this from occurring. Hopefully that will be set up soon sleeves are Artie in place at his home he just waiting for the machine. 12/25/17 on evaluation today patient actually appears to be doing excellent in fact all of his ulcers appear to have resolved his YOSGART, PAVEY. ([id number]) legs appear very well. I do think he needs compression stockings we have discussed this and they are actually going to go to [place] today to elastic therapy to get this fitted for him. I think that is definitely a good thing to do. Readmission: 04/09/18 upon evaluation today this patient is seen for readmission due to bilateral lower extremity lymphedema. He has significant swelling of his extremities especially on the left although the right is also swollen he has weeping from both sides. There are no obvious open wounds at this point. Fortunately he has been doing fairly well for quite a bit of time since I last saw him. Nonetheless unfortunately this seems to have reopened and is giving quite a bit of trouble. He states this began about a week ago when he first called [place] to get in to be seen. No fevers, chills, nausea, or vomiting noted at this time. He has not been using his lymphedema pumps due to the fact that they won't fit on his leg at this point likewise is also not been using his compression for essentially the same reason. 04/16/18 upon evaluation today patient actually appears to be doing a little better in regard to the fluid in his bilateral lower extremities. With that being said he's had three falls since I saw him last week. He also states that he's been feeling very poorly. I was  concerned last week and feel like that the concern is still there as far as the congestion in his chest is concerned he seems to be breathing about the same as last week but again he states he's very weak he's not even able to walk further than from the chair to the door. His wife had  to buy a wheelchair just to be able to get them out of the house to get to the appointment today. This has me very concerned. 04/23/18 on evaluation today patient actually appears to be doing much better than last week's evaluation. At that time actually had to transport him to the ER via EMS and he subsequently was admitted for acute pulmonary edema, acute renal failure, and acute congestive heart failure. Fortunately he is doing much better. Apparently they did dialyze him and were able to take off roughly 35 pounds of fluid. Nonetheless he is feeling much better both in regard to his breathing and he's able to get around much better at this time compared to previous. Overall I'm very happy with how things are at this time. There does not appear to be any evidence of infection currently. No fevers, chills, nausea, or vomiting noted at this time. 04/30/2018 patient seen today for follow-up and management of bilateral lower extremity lymphedema. He did express being more sad today than usual due to the recent loss of his dog. He states that he has been compliant with using the lymphedema pumps. However he does admit a minute over the last 2-3 days he has not been using the pumps due to the recent loss of his dog. At this time there is no drainage or open wounds to his lower extremities. The left leg edema is measuring smaller today. Still has a significant amount of edema on bilateral lower extremities With dry flaky skin. He will be referred to the lymphedema clinic for further management. Will continue 3 layer compression wraps and follow-up in 1-2 weeks.Denies any pain, fever, chairs recently. No recent falls or injuries reported during this visit. 05/07/18 on evaluation today patient actually appears to be doing very well in regard to his lower extremities in general all things considered. With that being said he is having some pain in the legs just due to the amount of swelling. He does have an  area where he had a blister on the left lateral lower extremity this is open at this point other than that there's nothing else weeping at this time. 05/14/18 on evaluation today patient actually appears to be doing excellent all things considered in regard to his lower extremities. He still has a couple areas of weeping on each leg which has continued to be the issue for him. He does have an appointment with the lymphedema clinic although this isn't until February 2020. That was the earliest they had. In the meantime he has continued to tolerate the compression wraps without complication. 05/28/18 on evaluation today patient actually appears to be doing more poorly in regard to his left lower extremity where he has a wound open at this point. He also had a fall where he subsequently injured his right great toe which has led to an open wound at the site unfortunately. He has been tolerating the dressing changes without complication in general as far as the wraps are concerned that he has not been putting any dressing on the left 1st toe ulcer site. 06/11/18 on evaluation today patient appears to be doing much worse in regard to his  bilateral lower extremity ulcers. He has been tolerating the dressing changes without complication although his legs have not been wrapped more recently. Overall I am not very pleased with the way his legs appear. I do believe he needs to be back in compression wraps he still has not received his compression wraps from the [facility] hospital as of yet. 06/18/18 on evaluation today patient actually appears to be doing significantly better than last time I saw him. He has been tolerating the compression wraps without complication in the circumferential ulcers especially appear to be doing much better. His toe ulcer on the right in regard to the great toe is better although not as good as the legs in my pinion. No fevers chills noted Adam Merritt, Adam Merritt ([id number]) 07/02/18 on evaluation  today patient appears to be doing much better in regard to his lower extremity ulcers. Unfortunately since I last saw him he's had the distal portion of his right great toe if he dated it sounds as if this actually went downhill very quickly. I had only seen him a few days prior and the toe did not appear to be infected at that point subsequently became infected very rapidly and it was decided by the surgeon that the distal portion of the toe needed to be removed. The patient seems to be doing well in this regard he tells me. With that being said his lower extremities are doing better from the standpoint of the wounds although he is significantly swollen at this point. 07/09/18 on evaluation today patient appears to be doing better in regard to the wounds on his lower extremities. In fact everything is almost completely healed he is just a small area on the left posterior lower extremity that is open at this point. 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 With that being said he needs some kind of compression it does sound like he may not be wearing his compression, that is the wraps, during the entire time between when he's here visit to visit. Apparently his wife took the current one off because it began to "fall apart". 07/16/18 on evaluation today patient appears to be extremely swollen especially in regard to his left lower extremity unfortunately. He also has a new skin tear over the left lower extremity and there's a smaller area on the right lower extremity as well. Unfortunately this seems to be due in part to blistering and fluid buildup in his leg. He did get the reduction wraps that were ordered by the [facility] hospital for him to go to lymphedema clinic. With that being said his wounds on the legs have not healed  to the point to where they would likely accept them as a patient lymphedema clinic currently. We need to try to get this to heal. With that being said he's been taking his wraps off which is not doing him any favors at this point. In fact this is probably quite counterproductive compared to what needs to occur. We will likely need to increase to a four layer compression wrap and continue to also utilize elevation and he has to keep the wraps on not take them off as he's been doing currently hasn't had a wrap on since Saturday. 07/23/18 on evaluation today patient appears to be doing much better in regard to his bilateral lower extremities.  In fact his left lower extremity which was the largest is actually 15 cm smaller today compared to what it was last time he was here in our clinic. This is obviously good news after just one week. Nonetheless the differences he actually kept the wraps on during the entire week this time. That's not typical for him. I do believe he understands a little bit better now the severity of the situation and why it's important for him to keep these wraps on. Electronic Signature(s) Signed: 07/23/2018 5:20:18 PM By: Worthy Keeler PA-C Entered By: Worthy Keeler on 07/23/2018 16:51:31 Adam Merritt ([id number]) -------------------------------------------------------------------------------- Physical Exam Details Patient Name: Adam Merritt Date of Service: 07/23/2018 1:45 PM Medical Record Number: [id number] Patient Account Number: [id number] Date of Birth/Sex: [birth year] (72 y.o. M) Treating RN: Montey Hora Primary Care Provider: [name] Other Clinician: Referring Provider: [name] Treating Provider/Extender: STONE III, [name]: 15 Constitutional Obese and well-hydrated in no acute distress. Respiratory normal breathing without difficulty. clear to auscultation bilaterally. Cardiovascular regular rate and rhythm with normal S1,  S2. Psychiatric this patient is able to make decisions and demonstrates good insight into disease process. Alert and Oriented x 3. pleasant and cooperative. Notes Patient's wound bed's currently all appear to be doing much better they seem to be healing there's good epithelialization overall I'm very pleased in this regard. I do believe he's making good progress hopefully we get the ones close shortly so that he can get in with the lymphedema clinic which is really what he needs as well to try to get his swelling down to a much more manageable and appropriate level. He already has the wraps ordered from the [facility] hospital for the lymphedema clinic he just has to get in with them but again we have to get the wounds closed first. Again he does have significant stage III lymphedema. Electronic Signature(s) Signed: 07/23/2018 5:20:18 PM By: Worthy Keeler PA-C Entered By: Worthy Keeler on 07/23/2018 16:52:36 Adam Merritt ([id number]) -------------------------------------------------------------------------------- Physician Orders Details Patient Name: Adam Merritt Date of Service: 07/23/2018 1:45 PM Medical Record Number: [id number] Patient Account Number: [id number] Date of Birth/Sex: 1946/05/14 (72 y.o. M) Treating RN: Montey Hora Primary Care Provider: [name] Other Clinician: Referring Provider: [name] Treating Provider/Extender: Melburn Hake, [name]: 15 Verbal / Phone Orders: No Diagnosis Coding ICD-10 Coding Code Description I89.0 Lymphedema, not elsewhere classified L97.512 Non-pressure chronic ulcer of other part of right foot with fat layer exposed L97.822 Non-pressure chronic ulcer of other part of left lower leg with fat layer exposed L98.492 Non-pressure chronic ulcer of skin of other sites with fat layer exposed E66.9 Obesity, unspecified Wound Cleansing o Cleanse wound with mild soap and water Skin Barriers/Peri-Wound Care o Moisturizing  lotion - to both lower legs Primary Wound Dressing Wound #18 Left,Lateral Lower Leg o Silver Alginate Secondary Dressing Wound #18 Left,Lateral Lower Leg o ABD pad - to any areas of draining lymphedema Dressing Change Frequency Wound #18 Left,Lateral Lower Leg o Other: - Twice weekly - nurse visit on Friday Follow-up Appointments Wound #18 Left,Lateral Lower Leg o Return Appointment in 1 week. - nurse visit on Friday o Nurse Visit as needed Edema Control Wound #18 Left,Lateral Lower Leg o 4 Layer Compression System - Bilateral Electronic Signature(s) Signed: 07/23/2018 4:18:49 PM By: Montey Hora Signed: 07/23/2018 5:20:18 PM By: Worthy Keeler PA-C Entered By: Montey Hora on 07/23/2018 14:47:57 Adam Merritt ([id number]) Adam Merritt. (  [id number]) -------------------------------------------------------------------------------- Problem List Details Patient Name: ALDWIN, MICALIZZI Date of Service: 07/23/2018 1:45 PM Medical Record Number: [id number] Patient Account Number: [id number] Date of Birth/Sex: February 12, 1946 (72 y.o. M) Treating RN: Montey Hora Primary Care Provider: [name] Other Clinician: Referring Provider: [name] Treating Provider/Extender: Melburn Hake, [name]: 15 Active Problems ICD-10 Evaluated Encounter Code Description Active Date Today Diagnosis I89.0 Lymphedema, not elsewhere classified 04/09/2018 No Yes L97.512 Non-pressure chronic ulcer of other part of right foot with fat 05/28/2018 No Yes layer exposed L97.822 Non-pressure chronic ulcer of other part of left lower leg with 05/28/2018 No Yes fat layer exposed L98.492 Non-pressure chronic ulcer of skin of other sites with fat layer 06/11/2018 No Yes exposed E66.9 Obesity, unspecified 04/09/2018 No Yes Inactive Problems Resolved Problems ICD-10 Code Description Active Date Resolved Date L03.115 Cellulitis of right lower limb 04/09/2018 04/09/2018 L03.116 Cellulitis  of left lower limb 04/09/2018 04/09/2018 Electronic Signature(s) Signed: 07/23/2018 5:20:18 PM By: Worthy Keeler PA-C Entered By: Worthy Keeler on 07/23/2018 14:36:20 Adam Merritt ([id number]) Adam Merritt, Adam Merritt ([id number]) -------------------------------------------------------------------------------- Progress Note Details Patient Name: Adam Merritt Date of Service: 07/23/2018 1:45 PM Medical Record Number: [id number] Patient Account Number: [id number] Date of Birth/Sex: Sep 02, 1945 (72 y.o. M) Treating RN: Montey Hora Primary Care Provider: [name] Other Clinician: Referring Provider: [name] Treating Provider/Extender: Melburn Hake, [name]: 15 Subjective Chief Complaint Information obtained from Patient he is here for evaluation of bilateral lower extremity lymphedema right right toe and left LE ulcers History of Present Illness (HPI) 10/18/17-He is here for initial evaluation of bilateral lower extremity ulcerations in the presence of venous insufficiency and lymphedema. He has been seen by vascular medicine in the past, Dr. Lucky Cowboy, last seen in 2016. He does have a history of abnormal ABIs, which is to be expected given his lymphedema and venous insufficiency. According to Epic, it appears that all attempts for arterial evaluation and/or angiography were not follow through with by patient. He does have a history of being seen in lymphedema clinic in 2018, stopped going approximately 6 months ago stating "it didn't do any good". He does not have lymphedema pumps, he does not have custom fit compression wrap/stockings. He is diabetic and his recent A1c last month was 7.6. He admits to chronic bilateral lower extremity pain, no change in pain since blister and ulceration development. He is currently being treated with Levaquin for bronchitis. He has home health and we will continue. 10/25/17-He is here in follow-up evaluation for bilateral lower extremity  ulcerationssubtle he remains on Levaquin for bronchitis. Right lower extremity with no evidence of drainage or ulceration, persistent left lower extremity ulceration. He states that home health has not been out since his appointment. He went to [facility] on Tuesday, studies revealed: RIGHT ABI 0.9, TBI 0.6 LEFT ABI 1.1, TBI 0.6 with triphasic flow bilaterally. We will continue his same treatment plan. He has been educated on compression therapy and need for elevation. He will benefit from lymphedema pumps 11/01/17-He is here in follow-up evaluation for left lower extremity ulcer. The right lower extremity remains healed. He has home health services, but they have not been out to see the patient for 2-3 weeks. He states it home health physical therapy changed his dressing yesterday after therapy; he placed Ace wrap compression. We are still waiting for lymphedema pumps, reordered d/t need for company change. 11/08/17-He is here in follow-up evaluation for left lower extremity ulcer. It is  improved. Edema is significantly improved with compression therapy. We will continue with same treatment plan and he will follow-up next week. No word regarding lymphedema pumps 11/15/17-He is here in follow-up evaluation for left lower extremity ulcer. He is healed and will be discharged from wound care services. I have reached out to [facility] regarding his lymphedema pumps. They have been unable to reach the patient; the contact number they had with the patient's wife's cell phone and she has not answered any unrecognized calls. Contact should be made today, trial planned for next week; [facility] will continue to follow 11/27/17 on evaluation today patient has multiple blistered areas over the right lower extremity his left lower extremity appears to be doing okay. These blistered areas show signs of no infection which is great news. With that being said he did have some necrotic skin  overlying which was mechanically debrided away with saline and gauze today without complication. Overall post debridement the wounds appear to be doing better but in general his swelling seems to be increased. This is obviously not good news. I think this is what has given rise to the blisters. 12/04/17 on evaluation today patient presents for follow-up concerning his bilateral lower extremity edema in the right lower extremity ulcers. He has been tolerating the dressing changes without complication. With that being said he has had no real issues with the wraps which is also good news. Overall I'm pleased with the progress he's been making. 12/11/17 on evaluation today patient appears to be doing rather well in regard to his right lateral lower extremity ulcer. He's been tolerating the dressing changes without complication. Fortunately there does not appear to be any evidence of infection at this time. Overall I'm pleased with the progress that is being made. Unfortunately he has been in the hospital due to having what sounds to be a stomach virus/flu fortunately that is starting to get better. 12/18/17 on evaluation today patient actually appears to be doing very well in regard to his bilateral lower extremities the Adam Merritt, Adam Merritt. ([id number]) swelling is under fairly good control his lymphedema pumps are still not up and running quite yet. With that being said he does have several areas of opening noted as far as wounds are concerned mainly over the left lower extremity. With that being said I do believe once he gets lymphedema pumps this would least help you mention some the fluid and preventing this from occurring. Hopefully that will be set up soon sleeves are Artie in place at his home he just waiting for the machine. 12/25/17 on evaluation today patient actually appears to be doing excellent in fact all of his ulcers appear to have resolved his legs appear very well. I do think he needs compression  stockings we have discussed this and they are actually going to go to [place] today to elastic therapy to get this fitted for him. I think that is definitely a good thing to do. Readmission: 04/09/18 upon evaluation today this patient is seen for readmission due to bilateral lower extremity lymphedema. He has significant swelling of his extremities especially on the left although the right is also swollen he has weeping from both sides. There are no obvious open wounds at this point. Fortunately he has been doing fairly well for quite a bit of time since I last saw him. Nonetheless unfortunately this seems to have reopened and is giving quite a bit of trouble. He states this began about a week ago when he  first called [place] to get in to be seen. No fevers, chills, nausea, or vomiting noted at this time. He has not been using his lymphedema pumps due to the fact that they won't fit on his leg at this point likewise is also not been using his compression for essentially the same reason. 04/16/18 upon evaluation today patient actually appears to be doing a little better in regard to the fluid in his bilateral lower extremities. With that being said he's had three falls since I saw him last week. He also states that he's been feeling very poorly. I was concerned last week and feel like that the concern is still there as far as the congestion in his chest is concerned he seems to be breathing about the same as last week but again he states he's very weak he's not even able to walk further than from the chair to the door. His wife had to buy a wheelchair just to be able to get them out of the house to get to the appointment today. This has me very concerned. 04/23/18 on evaluation today patient actually appears to be doing much better than last week's evaluation. At that time actually had to transport him to the ER via EMS and he subsequently was admitted for acute pulmonary edema, acute renal failure, and  acute congestive heart failure. Fortunately he is doing much better. Apparently they did dialyze him and were able to take off roughly 35 pounds of fluid. Nonetheless he is feeling much better both in regard to his breathing and he's able to get around much better at this time compared to previous. Overall I'm very happy with how things are at this time. There does not appear to be any evidence of infection currently. No fevers, chills, nausea, or vomiting noted at this time. 04/30/2018 patient seen today for follow-up and management of bilateral lower extremity lymphedema. He did express being more sad today than usual due to the recent loss of his dog. He states that he has been compliant with using the lymphedema pumps. However he does admit a minute over the last 2-3 days he has not been using the pumps due to the recent loss of his dog. At this time there is no drainage or open wounds to his lower extremities. The left leg edema is measuring smaller today. Still has a significant amount of edema on bilateral lower extremities With dry flaky skin. He will be referred to the lymphedema clinic for further management. Will continue 3 layer compression wraps and follow-up in 1-2 weeks.Denies any pain, fever, chairs recently. No recent falls or injuries reported during this visit. 05/07/18 on evaluation today patient actually appears to be doing very well in regard to his lower extremities in general all things considered. With that being said he is having some pain in the legs just due to the amount of swelling. He does have an area where he had a blister on the left lateral lower extremity this is open at this point other than that there's nothing else weeping at this time. 05/14/18 on evaluation today patient actually appears to be doing excellent all things considered in regard to his lower extremities. He still has a couple areas of weeping on each leg which has continued to be the issue for him.  He does have an appointment with the lymphedema clinic although this isn't until February 2020. That was the earliest they had. In the meantime he has continued to tolerate the compression wraps  without complication. 05/28/18 on evaluation today patient actually appears to be doing more poorly in regard to his left lower extremity where he has a wound open at this point. He also had a fall where he subsequently injured his right great toe which has led to an open wound at the site unfortunately. He has been tolerating the dressing changes without complication in general as far as the wraps are concerned that he has not been putting any dressing on the left 1st toe ulcer site. 06/11/18 on evaluation today patient appears to be doing much worse in regard to his bilateral lower extremity ulcers. He has been tolerating the dressing changes without complication although his legs have not been wrapped more recently. Overall I am not very pleased with the way his legs appear. I do believe he needs to be back in compression wraps he still has not received his compression wraps from the [facility] hospital as of yet. Adam Merritt, Adam Merritt ([id number]) 06/18/18 on evaluation today patient actually appears to be doing significantly better than last time I saw him. He has been tolerating the compression wraps without complication in the circumferential ulcers especially appear to be doing much better. His toe ulcer on the right in regard to the great toe is better although not as good as the legs in my pinion. No fevers chills noted 07/02/18 on evaluation today patient appears to be doing much better in regard to his lower extremity ulcers. Unfortunately since I last saw him he's had the distal portion of his right great toe if he dated it sounds as if this actually went downhill very quickly. I had only seen him a few days prior and the toe did not appear to be infected at that point subsequently became infected very rapidly  and it was decided by the surgeon that the distal portion of the toe needed to be removed. The patient seems to be doing well in this regard he tells me. With that being said his lower extremities are doing better from the standpoint of the wounds although he is significantly swollen at this point. 07/09/18 on evaluation today patient appears to be doing better in regard to the wounds on his lower extremities. In fact everything is almost completely healed he is just a small area on the left posterior lower extremity that is open at this point. 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 With that being said he needs some kind of compression it does sound like he may not be wearing his compression, that is the wraps, during the entire time between when he's here visit to visit. Apparently his wife took the current one off because it began to "fall apart". 07/16/18 on evaluation today patient appears to be extremely swollen especially in regard to his left lower extremity unfortunately. He also has a new skin tear over the left lower extremity and there's a smaller area on the right lower extremity as well. Unfortunately this seems to be due in part to blistering and fluid buildup in his leg. He did get the reduction wraps that were ordered by the [facility] hospital for him to go to lymphedema clinic. With that being said his wounds on the legs have not healed to the point to where they would likely accept them as a patient lymphedema clinic currently. We  need to try to get this to heal. With that being said he's been taking his wraps off which is not doing him any favors at this point. In fact this is probably quite counterproductive compared to what needs to occur. We will likely need to increase to a four layer compression wrap  and continue to also utilize elevation and he has to keep the wraps on not take them off as he's been doing currently hasn't had a wrap on since Saturday. 07/23/18 on evaluation today patient appears to be doing much better in regard to his bilateral lower extremities. In fact his left lower extremity which was the largest is actually 15 cm smaller today compared to what it was last time he was here in our clinic. This is obviously good news after just one week. Nonetheless the differences he actually kept the wraps on during the entire week this time. That's not typical for him. I do believe he understands a little bit better now the severity of the situation and why it's important for him to keep these wraps on. Patient History Information obtained from Patient. Family History Cancer - Paternal Grandparents, Kidney Disease - Siblings, Lung Disease - Mother, No family history of Diabetes, Heart Disease, Hereditary Spherocytosis, Hypertension, Seizures, Stroke, Thyroid Problems, Tuberculosis. Social History Never smoker, Marital Status - Married, Alcohol Use - Never, Drug Use - No History, Caffeine Use - Never. Medical History Eyes Denies history of Cataracts, Glaucoma, Optic Neuritis Ear/Nose/Mouth/Throat Denies history of Chronic sinus problems/congestion, Middle ear problems Hematologic/Lymphatic Patient has history of Anemia - aplastic anemia, Lymphedema Denies history of Hemophilia, Human Immunodeficiency Virus, Sickle Cell Disease Respiratory Patient has history of Chronic Obstructive Pulmonary Disease (COPD), Sleep Apnea Denies history of Aspiration, Asthma, Pneumothorax, Tuberculosis Cardiovascular Adam Merritt, Adam Merritt ([id number]) Patient has history of Congestive Heart Failure, Coronary Artery Disease, Hypertension, Peripheral Venous Disease Denies history of Angina, Arrhythmia, Deep Vein Thrombosis, Hypotension, Myocardial Infarction, Peripheral Arterial Disease, Phlebitis,  Vasculitis Gastrointestinal Denies history of Cirrhosis , Colitis, Crohn s, Hepatitis A, Hepatitis B, Hepatitis C Endocrine Patient has history of Type II Diabetes Denies history of Type I Diabetes Genitourinary Denies history of End Stage Renal Disease Immunological Denies history of Lupus Erythematosus, Raynaud s, Scleroderma Integumentary (Skin) Denies history of History of Burn, History of pressure wounds Musculoskeletal Patient has history of Osteoarthritis Denies history of Gout, Rheumatoid Arthritis, Osteomyelitis Neurologic Patient has history of Neuropathy Denies history of Dementia, Quadriplegia, Paraplegia, Seizure Disorder Oncologic Patient has history of Received Chemotherapy - last dose 2006 Denies history of Received Radiation Psychiatric Denies history of Anorexia/bulimia, Confinement Anxiety Hospitalization/Surgery History - 11/03/2013, [facility], bronchitis. Medical And Surgical History Notes Respiratory home O2 at night as needed Cardiovascular varicose veins Neurologic CVA - 1992 Review of Systems (ROS) Constitutional Symptoms (General Health) Denies complaints or symptoms of Fever, Chills. Respiratory The patient has no complaints or symptoms. Cardiovascular Complains or has symptoms of LE edema. Psychiatric The patient has no complaints or symptoms. Objective Constitutional Obese and well-hydrated in no acute distress. Adam Merritt, Adam Merritt ([id number]) Vitals Time Taken: 1:43 PM, Height: 66 in, Weight: 323 lbs, BMI: 52.1, Temperature: 98.3 F, Pulse: 91 bpm, Respiratory Rate: 18 breaths/min, Blood Pressure: 132/81 mmHg. Respiratory normal breathing without difficulty. clear to auscultation bilaterally. Cardiovascular regular rate and rhythm with normal S1, S2. Psychiatric this patient is able to make decisions and demonstrates good insight into disease process. Alert and Oriented x 3. pleasant and cooperative. General Notes: Patient's wound bed's  currently all appear to  be doing much better they seem to be healing there's good epithelialization overall I'm very pleased in this regard. I do believe he's making good progress hopefully we get the ones close shortly so that he can get in with the lymphedema clinic which is really what he needs as well to try to get his swelling down to a much more manageable and appropriate level. He already has the wraps ordered from the [facility] hospital for the lymphedema clinic he just has to get in with them but again we have to get the wounds closed first. Again he does have significant stage III lymphedema. Integumentary (Hair, Skin) Wound #18 status is Open. Original cause of wound was Trauma. The wound is located on the Left,Lateral Lower Leg. The wound measures 2.5cm length x 3cm width x 0.1cm depth; 5.89cm^2 area and 0.589cm^3 volume. There is no tunneling or undermining noted. There is a small (1-33%) amount of necrotic tissue within the wound bed including Adherent Slough. The periwound skin appearance exhibited: Dry/Scaly. The periwound skin appearance did not exhibit: Callus, Crepitus, Excoriation, Induration, Rash, Scarring, Maceration, Atrophie Blanche, Cyanosis, Ecchymosis, Hemosiderin Staining, Mottled, Pallor, Rubor, Erythema. Periwound temperature was noted as No Abnormality. 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 There was a Four Layer Compression Therapy Procedure with a pre-treatment ABI of 1.1 by Montey Hora, RN. Post procedure Diagnosis Wound #18: Same as Pre-Procedure Adam Merritt, Adam Merritt  ([id number]) Plan Wound Cleansing: Cleanse wound with mild soap and water Skin Barriers/Peri-Wound Care: Moisturizing lotion - to both lower legs Primary Wound Dressing: Wound #18 Left,Lateral Lower Leg: Silver Alginate Secondary Dressing: Wound #18 Left,Lateral Lower Leg: ABD pad - to any areas of draining lymphedema Dressing Change Frequency: Wound #18 Left,Lateral Lower Leg: Other: - Twice weekly - nurse visit on Friday Follow-up Appointments: Wound #18 Left,Lateral Lower Leg: Return Appointment in 1 week. - nurse visit on Friday Nurse Visit as needed Edema Control: Wound #18 Left,Lateral Lower Leg: 4 Layer Compression System - Bilateral My suggestion at this point is gonna be that we continue with the above wound care measures for the next week the patient is in agreement that plan. If anything changes or worsens meantime he will contact the office and let me know. Otherwise we're gonna see were things stand at that point. Please see above for specific wound care orders. We will see patient for re-evaluation in 1 week(s) here in the clinic. If anything worsens or changes patient will contact our office for additional recommendations. Electronic Signature(s) Signed: 07/23/2018 5:20:18 PM By: Worthy Keeler PA-C Entered By: Worthy Keeler on 07/23/2018 16:53:07 Adam Merritt ([id number]) -------------------------------------------------------------------------------- ROS/PFSH Details Patient Name: Adam Merritt Date of Service: 07/23/2018 1:45 PM Medical Record Number: [id number] Patient Account Number: [id number] Date of Birth/Sex: [birth year] (72 y.o. M) Treating RN: Montey Hora Primary Care Provider: [name] Other Clinician: Referring Provider: [name] Treating Provider/Extender: Melburn Hake, [name]: 15 Information Obtained From Patient Wound History Do you currently have one or more open woundso No Have you tested positive for osteomyelitis  (bone infection)o No Have you had any tests for circulation on your legso  Yes Constitutional Symptoms (General Health) Complaints and Symptoms: Negative for: Fever; Chills Cardiovascular Complaints and Symptoms: Positive for: LE edema Medical History: Positive for: Congestive Heart Failure; Coronary Artery Disease; Hypertension; Peripheral Venous Disease Negative for: Angina; Arrhythmia; Deep Vein Thrombosis; Hypotension; Myocardial Infarction; Peripheral Arterial Disease; Phlebitis; Vasculitis Past Medical History Notes: varicose veins Eyes Medical History: Negative for: Cataracts; Glaucoma; Optic Neuritis Ear/Nose/Mouth/Throat Medical History: Negative for: Chronic sinus problems/congestion; Middle ear problems Hematologic/Lymphatic Medical History: Positive for: Anemia - aplastic anemia; Lymphedema Negative for: Hemophilia; Human Immunodeficiency Virus; Sickle Cell Disease Respiratory Complaints and Symptoms: No Complaints or Symptoms Medical History: Positive for: Chronic Obstructive Pulmonary Disease (COPD); Sleep Apnea Negative for: Aspiration; Asthma; Pneumothorax; Tuberculosis Past Medical History Notes: Adam Merritt, Adam Merritt ([id number]) home O2 at night as needed Gastrointestinal Medical History: Negative for: Cirrhosis ; Colitis; Crohnos; Hepatitis A; Hepatitis B; Hepatitis C Endocrine Medical History: Positive for: Type II Diabetes Negative for: Type I Diabetes Time with diabetes: since 2006 Treated with: Insulin Blood sugar tested every day: Yes Tested : Blood sugar testing results: Breakfast: 209 Genitourinary Medical History: Negative for: End Stage Renal Disease Immunological Medical History: Negative for: Lupus Erythematosus; Raynaudos; Scleroderma Integumentary (Skin) Medical History: Negative for: History of Burn; History of pressure wounds Musculoskeletal Medical History: Positive for: Osteoarthritis Negative for: Gout; Rheumatoid Arthritis;  Osteomyelitis Neurologic Medical History: Positive for: Neuropathy Negative for: Dementia; Quadriplegia; Paraplegia; Seizure Disorder Past Medical History Notes: CVA - 1992 Oncologic Medical History: Positive for: Received Chemotherapy - last dose 2006 Negative for: Received Radiation Psychiatric Complaints and Symptoms: No Complaints or Symptoms Medical History: Adam Merritt, Adam Merritt ([id number]) Negative for: Anorexia/bulimia; Confinement Anxiety Immunizations Pneumococcal Vaccine: Received Pneumococcal Vaccination: Yes Implantable Devices Hospitalization / Surgery History Name of Hospital Purpose of Hospitalization/Sugery Date [facility] bronchitis 11/03/2013 Family and Social History Cancer: Yes - Paternal Grandparents; Diabetes: No; Heart Disease: No; Hereditary Spherocytosis: No; Hypertension: No; Kidney Disease: Yes - Siblings; Lung Disease: Yes - Mother; Seizures: No; Stroke: No; Thyroid Problems: No; Tuberculosis: No; Never smoker; Marital Status - Married; Alcohol Use: Never; Drug Use: No History; Caffeine Use: Never; Financial Concerns: No; Food, Clothing or Shelter Needs: No; Support System Lacking: No; Transportation Concerns: No; Advanced Directives: No; Patient does not want information on Advanced Directives Physician Affirmation I have reviewed and agree with the above information. Electronic Signature(s) Signed: 07/23/2018 5:20:18 PM By: Worthy Keeler PA-C Signed: 07/24/2018 5:36:16 PM By: Montey Hora Entered By: Worthy Keeler on 07/23/2018 16:51:49 Adam Merritt ([id number]) -------------------------------------------------------------------------------- SuperBill Details Patient Name: Adam Merritt Date of Service: 07/23/2018 Medical Record Number: [id number] Patient Account Number: [id number] Date of Birth/Sex: 1945/08/13 (72 y.o. M) Treating RN: Montey Hora Primary Care Provider: [name] Other Clinician: Referring Provider: [name] Treating  Provider/Extender: Melburn Hake, [name]: 15 Diagnosis Coding ICD-10 Codes Code Description I89.0 Lymphedema, not elsewhere classified L97.512 Non-pressure chronic ulcer of other part of right foot with fat layer exposed L97.822 Non-pressure chronic ulcer of other part of left lower leg with fat layer exposed L98.492 Non-pressure chronic ulcer of skin of other sites with fat layer exposed E66.9 Obesity, unspecified Facility Procedures CPT4: Description Modifier Quantity Code [id number] BILATERAL: Application of multi-layer venous compression system; leg (below 1 knee), including ankle and foot. Physician Procedures CPT4 Code Description: [id number] - WC PHYS LEVEL 4 - EST PT ICD-10 Diagnosis Description I89.0 Lymphedema, not elsewhere classified L97.512 Non-pressure chronic ulcer of other part of right foot with fat L97.822 Non-pressure chronic ulcer of other  part of left lower leg with L98.492 Non-pressure chronic ulcer of skin of other sites with fat layer Modifier: layer exposed fat layer expos exposed Quantity: 1 ed Electronic Signature(s) Signed: 07/23/2018 5:20:18 PM By: Worthy Keeler PA-C Previous Signature: 07/23/2018 3:08:50 PM Version By: Montey Hora Entered By: Worthy Keeler on 07/23/2018 16:53:46

## 2018-07-26 DIAGNOSIS — E11621 Type 2 diabetes mellitus with foot ulcer: Secondary | ICD-10-CM | POA: Diagnosis not present

## 2018-07-27 NOTE — Progress Notes (Signed)
SIYON, LINCK ([id number]) Visit Report for 07/23/2018 Arrival Information Details Patient Name: Adam Merritt, Adam Merritt Date of Service: 07/23/2018 1:45 PM Medical Record Number: [id number] Patient Account Number: [id number] Date of Birth/Sex: 06/25/1945 (72 y.o. M) Treating RN: Montey Hora Primary Care Florabelle [name]: [name] Other Clinician: Referring Thatcher Doberstein: [name] Treating Shrika Milos/Extender: Melburn Hake, [name]: 15 Visit Information History Since Last Visit Added or deleted any medications: No Patient Arrived: Walker Any new allergies or adverse reactions: No Arrival Time: 13:42 Had a fall or experienced change in No Accompanied By: wife activities of daily living that may affect Transfer Assistance: None risk of falls: Patient Identification Verified: Yes Signs or symptoms of abuse/neglect since last visito No Secondary Verification Process Yes Hospitalized since last visit: No Completed: Implantable device outside of the clinic excluding No Patient Has Alerts: Yes cellular tissue based products placed in the center Patient Alerts: Patient on Blood since last visit: Thinner Has Dressing in Place as Prescribed: Yes Aspirin 325mg  Pain Present Now: No Type II Diabetes ABI 5/19 L 1.1 R 0.9 TBI L 0.6 R 0.6 Electronic Signature(s) Signed: 07/23/2018 4:31:59 PM By: Lorine Bears RCP, RRT, CHT Entered By: Lorine Bears on 07/23/2018 13:43:29 Adam Merritt ([id number]) -------------------------------------------------------------------------------- Compression Therapy Details Patient Name: Adam Merritt Date of Service: 07/23/2018 1:45 PM Medical Record Number: [id number] Patient Account Number: [id number] Date of Birth/Sex: 10-20-1945 (72 y.o. M) Treating RN: Montey Hora Primary Care Jeb Schloemer: [name] Other Clinician: Referring Shoshanah Dapper: [name] Treating Zakai Gonyea/Extender: Melburn Hake, [name]:  15 Compression Therapy Performed for Wound Assessment: Wound #18 Left,Lateral Lower Leg Performed By: Clinician Montey Hora, RN Compression Type: Four Layer Pre Treatment ABI: 1.1 Post Procedure Diagnosis Same as Pre-procedure Electronic Signature(s) Signed: 07/23/2018 3:08:39 PM By: Montey Hora Entered By: Montey Hora on 07/23/2018 15:08:38 Adam Merritt ([id number]) -------------------------------------------------------------------------------- Encounter Discharge Information Details Patient Name: Adam Merritt Date of Service: 07/23/2018 1:45 PM Medical Record Number: [id number] Patient Account Number: [id number] Date of Birth/Sex: [birth year] (73 y.o. M) Treating RN: Montey Hora Primary Care Grayling Schranz: [name] Other Clinician: Referring Kynsleigh Westendorf: [name] Treating Kerie Badger/Extender: Melburn Hake, [name]: 15 Encounter Discharge Information Items Discharge Condition: Stable Ambulatory Status: Cane Discharge Destination: Home Transportation: Private Auto Accompanied By: spouse Schedule Follow-up Appointment: No Clinical Summary of Care: Electronic Signature(s) Signed: 07/23/2018 3:09:29 PM By: Montey Hora Entered By: Montey Hora on 07/23/2018 15:09:28 Adam Merritt ([id number]) -------------------------------------------------------------------------------- Lower Extremity Assessment Details Patient Name: Adam Merritt Date of Service: 07/23/2018 1:45 PM Medical Record Number: [id number] Patient Account Number: [id number] Date of Birth/Sex: 07/17/1945 (72 y.o. M) Treating RN: Army Melia Primary Care Clarita Mcelvain: [name] Other Clinician: Referring Chiron Campione: [name] Treating Evalyn Shultis/Extender: Melburn Hake, [name]: 15 Edema Assessment Assessed: [Left: No] [Right: No] Edema: [Left: Yes] [Right: Yes] Calf Left: Right: Point of Measurement: 34 cm From Medial Instep 50 cm 45 cm Ankle Left: Right: Point of  Measurement: 12 cm From Medial Instep 35 cm 33 cm Vascular Assessment Pulses: Dorsalis Pedis Palpable: [Left:Yes] [Right:Yes] Posterior Tibial Extremity colors, hair growth, and conditions: Extremity Color: [Left:Hyperpigmented] [Right:Hyperpigmented] Hair Growth on Extremity: [Left:No] [Right:No] Temperature of Extremity: [Left:Warm] [Right:Warm] Capillary Refill: [Left:< 3 seconds] [Right:< 3 seconds] Toe Nail Assessment Left: Right: Thick: Yes Yes Discolored: Yes Yes Deformed: No No Improper Length and Hygiene: No No Electronic Signature(s) Signed: 07/26/2018 4:09:31 PM By: Army Melia Entered By: Army Melia on 07/23/2018 14:24:58 Adam Merritt ([id number]) --------------------------------------------------------------------------------  Multi Wound Chart Details Patient Name: Adam Merritt, Adam Merritt Date of Service: 07/23/2018 1:45 PM Medical Record Number: [id number] Patient Account Number: [id number] Date of Birth/Sex: [birth year] (72 y.o. M) Treating RN: Montey Hora Primary Care Hammond Obeirne: [name] Other Clinician: Referring Cyra Spader: [name] Treating Jeremian Whitby/Extender: Melburn Hake, [name]: 15 Vital Signs Height(in): 73 Pulse(bpm): 91 Weight(lbs): 323 Blood Pressure(mmHg): 132/81 Body Mass Index(BMI): 52 Temperature(F): 98.3 Respiratory Rate 18 (breaths/min): Photos: [18:No Photos] [N/A:N/A] Wound Location: [18:Left Lower Leg - Lateral] [N/A:N/A] Wounding Event: [18:Trauma] [N/A:N/A] Primary Etiology: [18:Trauma, Other] [N/A:N/A] Comorbid History: [18:Anemia, Lymphedema, Chronic Obstructive Pulmonary Disease (COPD), Sleep Apnea, Congestive Heart Failure, Coronary Artery Disease, Hypertension, Peripheral Venous Disease, Type II Diabetes, Osteoarthritis, Neuropathy, Received  Chemotherapy] [N/A:N/A] Date Acquired: [18:07/16/2018] [N/A:N/A] Weeks of Treatment: [18:1] [N/A:N/A] Wound Status: [18:Open] [N/A:N/A] Measurements L x W x D [18:2.5x3x0.1]  [N/A:N/A] (cm) Area (cm) : [18:5.89] [N/A:N/A] Volume (cm) : [18:0.589] [N/A:N/A] % Reduction in Area: [18:28.60%] [N/A:N/A] % Reduction in Volume: [18:28.60%] [N/A:N/A] Classification: [18:Partial Thickness] [N/A:N/A] Necrotic Amount: [18:N/A] [N/A:N/A] Periwound Skin Texture: [18:Excoriation: No Induration: No Callus: No Crepitus: No Rash: No Scarring: No] [N/A:N/A] Periwound Skin Moisture: [18:Dry/Scaly: Yes Maceration: No] [N/A:N/A] Periwound Skin Color: [18:Atrophie Blanche: No Cyanosis: No Ecchymosis: No Erythema: No] [N/A:N/A] Hemosiderin Staining: No Mottled: No Pallor: No Rubor: No Temperature: No Abnormality N/A N/A Tenderness on Palpation: No N/A N/A Wound Preparation: Ulcer Cleansing: N/A N/A Rinsed/Irrigated with Saline Topical Anesthetic Applied: Other: lidocaine 4% Treatment Notes Electronic Signature(s) Signed: 07/23/2018 4:18:49 PM By: Montey Hora Entered By: Montey Hora on 07/23/2018 14:46:01 Adam Merritt ([id number]) -------------------------------------------------------------------------------- [place] Details Patient Name: Adam Merritt Date of Service: 07/23/2018 1:45 PM Medical Record Number: [id number] Patient Account Number: [id number] Date of Birth/Sex: Apr 01, 1946 (72 y.o. M) Treating RN: Montey Hora Primary Care Malayna Noori: [name] Other Clinician: Referring Amaree Loisel: [name] Treating Vira Chaplin/Extender: Melburn Hake, [name]: 15 Active Inactive Abuse / Safety / Falls / Self Care Management Nursing Diagnoses: Potential for falls Goals: Patient will remain injury free related to falls Date Initiated: 04/09/2018 Target Resolution Date: 07/13/2018 Goal Status: Active Interventions: Assess fall risk on admission and as needed Notes: Orientation to the Wound Care Program Nursing Diagnoses: Knowledge deficit related to the wound healing center program Goals: Patient/caregiver will verbalize  understanding of the [facility] Program Date Initiated: 04/09/2018 Target Resolution Date: 07/13/2018 Goal Status: Active Interventions: Provide education on orientation to the wound center Notes: Venous Leg Ulcer Nursing Diagnoses: Actual venous Insuffiency (use after diagnosis is confirmed) Goals: Patient will maintain optimal edema control Date Initiated: 04/09/2018 Target Resolution Date: 07/13/2018 Goal Status: Active Interventions: Assess peripheral edema status every visit. Adam Merritt, Adam Merritt ([id number]) Compression as ordered Provide education on venous insufficiency Notes: Electronic Signature(s) Signed: 07/23/2018 4:18:49 PM By: Montey Hora Entered By: Montey Hora on 07/23/2018 14:45:52 Adam Merritt ([id number]) -------------------------------------------------------------------------------- Pain Assessment Details Patient Name: Adam Merritt Date of Service: 07/23/2018 1:45 PM Medical Record Number: [id number] Patient Account Number: [id number] Date of Birth/Sex: [birth year] (72 y.o. M) Treating RN: Montey Hora Primary Care Chaia Ikard: [name] Other Clinician: Referring Miyako Oelke: [name] Treating Tayla Panozzo/Extender: Melburn Hake, [name]: 15 Active Problems Location of Pain Severity and Description of Pain Patient Has Paino No Site Locations Pain Management and Medication Current Pain Management: Electronic Signature(s) Signed: 07/23/2018 4:18:49 PM By: Montey Hora Signed: 07/23/2018 4:31:59 PM By: Lorine Bears RCP, RRT, CHT Entered By: Lorine Bears on 07/23/2018 13:43:43 Adam Merritt. (  [id number]) -------------------------------------------------------------------------------- Patient/Caregiver Education Details Patient Name: Adam Merritt, Adam Merritt Date of Service: 07/23/2018 1:45 PM Medical Record Number: [id number] Patient Account Number: [id number] Date of Birth/Gender: 1946-01-29 (73 y.o.  M) Treating RN: Montey Hora Primary Care Physician: [name] Other Clinician: Referring Physician: [name] Treating Physician/Extender: Sharalyn Ink in Treatment: 15 Education Assessment Education Provided To: Patient and Caregiver Education Topics Provided Venous: Handouts: Other: leg elevation and keep wraps on Methods: Explain/Verbal Responses: State content correctly Electronic Signature(s) Signed: 07/23/2018 4:18:49 PM By: Montey Hora Entered By: Montey Hora on 07/23/2018 15:09:51 Adam Merritt ([id number]) -------------------------------------------------------------------------------- Wound Assessment Details Patient Name: Adam Merritt Date of Service: 07/23/2018 1:45 PM Medical Record Number: [id number] Patient Account Number: [id number] Date of Birth/Sex: 11/19/1945 (72 y.o. M) Treating RN: Army Melia Primary Care Sayre Mazor: [name] Other Clinician: Referring Pallie Swigert: [name] Treating Zalayah Pizzuto/Extender: STONE III, [name]: 15 Wound Status Wound Number: 18 Primary Trauma, Other Etiology: Wound Location: Left Lower Leg - Lateral Wound Open Wounding Event: Trauma Status: Date Acquired: 07/16/2018 Comorbid Anemia, Lymphedema, Chronic Obstructive Weeks Of Treatment: 1 History: Pulmonary Disease (COPD), Sleep Apnea, Clustered Wound: No Congestive Heart Failure, Coronary Artery Disease, Hypertension, Peripheral Venous Disease, Type II Diabetes, Osteoarthritis, Neuropathy, Received Chemotherapy Photos Photo Uploaded By: Army Melia on 07/24/2018 10:09:14 Wound Measurements Length: (cm) 2.5 Width: (cm) 3 Depth: (cm) 0.1 Area: (cm) 5.89 Volume: (cm) 0.589 % Reduction in Area: 28.6% % Reduction in Volume: 28.6% Tunneling: No Undermining: No Wound Description Classification: Partial Thickness Wound Bed Necrotic Amount: Small (1-33%) Necrotic Quality: Adherent Slough Periwound Skin Texture Texture  Color No Abnormalities Noted: No No Abnormalities Noted: No Callus: No Atrophie Blanche: No Crepitus: No Cyanosis: No Excoriation: No Ecchymosis: No Induration: No Erythema: No Adam Merritt, Adam Merritt ([id number]) Rash: No Hemosiderin Staining: No Scarring: No Mottled: No Pallor: No Moisture Rubor: No No Abnormalities Noted: No Dry / Scaly: Yes Temperature / Pain Maceration: No Temperature: No Abnormality Wound Preparation Ulcer Cleansing: Rinsed/Irrigated with Saline Topical Anesthetic Applied: Other: lidocaine 4%, Electronic Signature(s) Signed: 07/26/2018 4:09:31 PM By: Army Melia Entered By: Army Melia on 07/23/2018 14:22:19 Adam Merritt ([id number]) -------------------------------------------------------------------------------- Vitals Details Patient Name: Adam Merritt Date of Service: 07/23/2018 1:45 PM Medical Record Number: [id number] Patient Account Number: [id number] Date of Birth/Sex: [birth year] (72 y.o. M) Treating RN: Montey Hora Primary Care Estanislado Surgeon: [name] Other Clinician: Referring Chariti Havel: [name] Treating Jaimi Belle/Extender: Melburn Hake, [name]: 15 Vital Signs Time Taken: 13:43 Temperature (F): 98.3 Height (in): 66 Pulse (bpm): 91 Weight (lbs): 323 Respiratory Rate (breaths/min): 18 Body Mass Index (BMI): 52.1 Blood Pressure (mmHg): 132/81 Reference Range: 80 - 120 mg / dl Electronic Signature(s) Signed: 07/23/2018 4:31:59 PM By: Lorine Bears RCP, RRT, CHT Entered By: Lorine Bears on 07/23/2018 13:46:23

## 2018-07-28 NOTE — Progress Notes (Signed)
Adam Merritt, Adam Merritt ([id number]) Visit Report for 07/26/2018 Arrival Information Details Patient Name: Adam Merritt, Adam Merritt Date of Service: 07/26/2018 12:30 PM Medical Record Number: [id number] Patient Account Number: [id number] Date of Birth/Sex: 1945/11/08 (73 y.o. M) Treating RN: Montey Hora Primary Care Daniyah Fohl: [name] Other Clinician: Referring Kelechi Astarita: [name] Treating Oluwaseyi Raffel/Extender: Melburn Hake, [name]: 15 Visit Information History Since Last Visit Added or deleted any medications: No Patient Arrived: Walker Any new allergies or adverse reactions: No Arrival Time: 12:43 Had a fall or experienced change in No Accompanied By: wife activities of daily living that may affect Transfer Assistance: None risk of falls: Patient Identification Verified: Yes Signs or symptoms of abuse/neglect since last visito No Secondary Verification Process Yes Hospitalized since last visit: No Completed: Implantable device outside of the clinic excluding No Patient Has Alerts: Yes cellular tissue based products placed in the center Patient Alerts: Patient on Blood since last visit: Thinner Has Dressing in Place as Prescribed: Yes Aspirin 325mg  Has Compression in Place as Prescribed: Yes Type II Diabetes ABI 5/19 L 1.1 R 0.9 Pain Present Now: No TBI L 0.6 R 0.6 Electronic Signature(s) Signed: 07/26/2018 4:37:43 PM By: Montey Hora Entered By: Montey Hora on 07/26/2018 12:43:46 Adam Merritt ([id number]) -------------------------------------------------------------------------------- Compression Therapy Details Patient Name: Adam Merritt Date of Service: 07/26/2018 12:30 PM Medical Record Number: [id number] Patient Account Number: [id number] Date of Birth/Sex: 1945/12/21 (73 y.o. M) Treating RN: Montey Hora Primary Care Adreana Coull: [name] Other Clinician: Referring Keyri Salberg: [name] Treating Ruperto Kiernan/Extender: Melburn Hake, [name]:  15 Compression Therapy Performed for Wound Assessment: Wound #18 Left,Lateral Lower Leg Performed By: Clinician Montey Hora, RN Compression Type: Four Layer Pre Treatment ABI: 1.1 Electronic Signature(s) Signed: 07/26/2018 4:37:43 PM By: Montey Hora Entered By: Montey Hora on 07/26/2018 12:44:47 Adam Merritt ([id number]) -------------------------------------------------------------------------------- Encounter Discharge Information Details Patient Name: Adam Merritt Date of Service: 07/26/2018 12:30 PM Medical Record Number: [id number] Patient Account Number: [id number] Date of Birth/Sex: Feb 19, 1946 (73 y.o. M) Treating RN: Montey Hora Primary Care Tyjae Shvartsman: [name] Other Clinician: Referring Shantil Vallejo: [name] Treating Anabela Crayton/Extender: Melburn Hake, [name]: 15 Encounter Discharge Information Items Discharge Condition: Stable Ambulatory Status: Walker Discharge Destination: Home Transportation: Private Auto Accompanied By: wife Schedule Follow-up Appointment: Yes Clinical Summary of Care: Electronic Signature(s) Signed: 07/26/2018 4:37:43 PM By: Montey Hora Entered By: Montey Hora on 07/26/2018 12:45:30 Adam Merritt ([id number]) -------------------------------------------------------------------------------- Patient/Caregiver Education Details Patient Name: Adam Merritt Date of Service: 07/26/2018 12:30 PM Medical Record Number: [id number] Patient Account Number: [id number] Date of Birth/Gender: 11-06-1945 (73 y.o. M) Treating RN: Montey Hora Primary Care Physician: [name] Other Clinician: Referring Physician: [name] Treating Physician/Extender: Sharalyn Ink in Treatment: 15 Education Assessment Education Provided To: Patient and Caregiver Education Topics Provided Venous: Handouts: Other: leg elevation Methods: Explain/Verbal Responses: State content correctly Electronic Signature(s) Signed:  07/26/2018 4:37:43 PM By: Montey Hora Entered By: Montey Hora on 07/26/2018 12:45:18 Adam Merritt ([id number]) -------------------------------------------------------------------------------- Wound Assessment Details Patient Name: Adam Merritt Date of Service: 07/26/2018 12:30 PM Medical Record Number: [id number] Patient Account Number: [id number] Date of Birth/Sex: [birth year] (73 y.o. M) Treating RN: Montey Hora Primary Care Assia Meanor: [name] Other Clinician: Referring Mally Gavina: [name] Treating Schyler Butikofer/Extender: STONE III, [name]: 15 Wound Status Wound Number: 18 Primary Trauma, Other Etiology: Wound Location: Left Lower Leg - Lateral Wound Open Wounding Event: Trauma Status: Date Acquired: 07/16/2018 Comorbid Anemia, Lymphedema, Chronic Obstructive Weeks  Of Treatment: 1 History: Pulmonary Disease (COPD), Sleep Apnea, Clustered Wound: No Congestive Heart Failure, Coronary Artery Disease, Hypertension, Peripheral Venous Disease, Type II Diabetes, Osteoarthritis, Neuropathy, Received Chemotherapy Wound Measurements Length: (cm) 2.5 Width: (cm) 3 Depth: (cm) 0.1 Area: (cm) 5.89 Volume: (cm) 0.589 % Reduction in Area: 28.6% % Reduction in Volume: 28.6% Epithelialization: Small (1-33%) Tunneling: No Undermining: No Wound Description Classification: Partial Thickness Foul Od Wound Margin: Flat and Intact Slough/ Exudate Amount: Medium Exudate Type: Serous Exudate Color: amber or After Cleansing: No Fibrino Yes Wound Bed Granulation Amount: Large (67-100%) Exposed Structure Granulation Quality: Pink Fascia Exposed: No Necrotic Amount: Small (1-33%) Fat Layer (Subcutaneous Tissue) Exposed: Yes Necrotic Quality: Adherent Slough Tendon Exposed: No Muscle Exposed: No Joint Exposed: No Bone Exposed: No Periwound Skin Texture Texture Color No Abnormalities Noted: No No Abnormalities Noted: No Callus: No Atrophie Blanche:  No Crepitus: No Cyanosis: No Excoriation: No Ecchymosis: No Induration: No Erythema: No Rash: No Hemosiderin Staining: No Scarring: No Mottled: No Pallor: No Moisture Rubor: No No Abnormalities Noted: No TANMAY, HALTEMAN ([id number]) Dry / Scaly: Yes Temperature / Pain Maceration: No Temperature: No Abnormality Wound Preparation Ulcer Cleansing: Rinsed/Irrigated with Saline, Other: soap and water, Topical Anesthetic Applied: Other: lidocaine 4%, Treatment Notes Wound #18 (Left, Lateral Lower Leg) Notes Silver Cell, ABD, 4 layer wrap bilateral Electronic Signature(s) Signed: 07/26/2018 4:37:43 PM By: Montey Hora Entered By: Montey Hora on 07/26/2018 12:44:23

## 2018-07-29 ENCOUNTER — Ambulatory Visit (INDEPENDENT_AMBULATORY_CARE_PROVIDER_SITE_OTHER): Payer: Medicare Other | Admitting: Nurse Practitioner

## 2018-07-29 ENCOUNTER — Encounter: Payer: Medicare Other | Admitting: Occupational Therapy

## 2018-07-29 ENCOUNTER — Encounter: Payer: Self-pay | Admitting: Nurse Practitioner

## 2018-07-29 VITALS — BP 142/84 | HR 84 | Resp 16 | Ht 66.0 in | Wt 291.8 lb

## 2018-07-29 DIAGNOSIS — E1142 Type 2 diabetes mellitus with diabetic polyneuropathy: Secondary | ICD-10-CM | POA: Diagnosis not present

## 2018-07-29 DIAGNOSIS — F5101 Primary insomnia: Secondary | ICD-10-CM

## 2018-07-29 DIAGNOSIS — I10 Essential (primary) hypertension: Secondary | ICD-10-CM

## 2018-07-29 DIAGNOSIS — I872 Venous insufficiency (chronic) (peripheral): Secondary | ICD-10-CM

## 2018-07-29 DIAGNOSIS — I890 Lymphedema, not elsewhere classified: Secondary | ICD-10-CM | POA: Diagnosis not present

## 2018-07-29 DIAGNOSIS — R609 Edema, unspecified: Secondary | ICD-10-CM

## 2018-07-29 DIAGNOSIS — R600 Localized edema: Secondary | ICD-10-CM

## 2018-07-29 DIAGNOSIS — G894 Chronic pain syndrome: Secondary | ICD-10-CM

## 2018-07-29 MED ORDER — FENTANYL 50 MCG/HR TD PT72: 1.0000 | MEDICATED_PATCH | TRANSDERMAL | 0 refills | Status: DC

## 2018-07-29 MED ORDER — PENTOXIFYLLINE ER 400 MG PO TBCR: EXTENDED_RELEASE_TABLET | ORAL | 3 refills | Status: DC

## 2018-07-29 MED ORDER — ZOLPIDEM TARTRATE 10 MG PO TABS: 10.0000 mg | ORAL_TABLET | Freq: Every evening | ORAL | 3 refills | Status: DC | PRN

## 2018-07-29 MED ORDER — FUROSEMIDE 40 MG PO TABS: 40.0000 mg | ORAL_TABLET | Freq: Every day | ORAL | 3 refills | Status: DC

## 2018-07-29 NOTE — Progress Notes (Signed)
[facility] [place], [place] [address]  Internal MEDICINE  Office Visit Note  Patient Name: Adam Merritt  [id number]  [id number]  Date of Service: 08/07/2018  Chief Complaint  Patient presents with  . Medical Management of Chronic Issues    4wk follow up medication refills    The patient is c/o increased pain in both legs. Currently on fentanyl 34mcg patches, changed every 3 days. Initially working very well, but now, pain in legs and feet is starting to get worse. He states that pain is about 8/10 in severity, can be as low as 5 or 6/10 in severity with the fentanyl patch. Would do better if pain was at level of 3/10 in severity. He needs to have refill for his fentanyl pain patch. 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 Both lower legs are currently wrapped in clean and dry dressings. Patient reports the wounds are getting better. Swelling is improving.       Current Medication: Outpatient Encounter Medications as of 07/29/2018  Medication Sig  . albuterol (PROVENTIL HFA;VENTOLIN HFA) 108 (90 Base) MCG/ACT inhaler INHALE 1 PUFF INTO THE LUNGS EVERY 6 HOURS AS NEEDED FOR WHEEZING OR SHORTNESS OF BREATH (Patient taking differently: Inhale 1 puff into the lungs every 6 (six) hours as needed for wheezing or shortness of breath. )  . aspirin 325 MG EC tablet Take 325 mg by mouth daily.  Marland Kitchen atorvastatin (LIPITOR) 40 MG tablet Take 40 mg by mouth at bedtime.   . carvedilol (COREG) 25 MG tablet Take 25 mg by mouth 2 (two) times daily with a meal.  . docusate sodium (COLACE) 50 MG capsule Take 50 mg by mouth 2 (two) times daily.  Marland Kitchen ENSURE MAX PROTEIN (ENSURE MAX PROTEIN) LIQD Take 330 mLs (11 oz total) by mouth 2 (two) times daily.  . febuxostat (ULORIC) 40 MG tablet Take 1 tablet (40 mg total) by mouth daily.  . fentaNYL (DURAGESIC) 50 MCG/HR Place 1 patch onto the skin every 3 (three) days.  . furosemide  (LASIX) 40 MG tablet Take 1 tablet (40 mg total) by mouth daily.  Marland Kitchen gabapentin (NEURONTIN) 300 MG capsule Take 1 capsule po in AM and 1 capsule po in pm. Take 2 capsules po at bedtime.  Marland Kitchen HYDROcodone-acetaminophen (NORCO/VICODIN) 5-325 MG tablet Take 1 tablet by mouth every 6 (six) hours as needed for moderate pain or severe pain.  . indapamide (LOZOL) 2.5 MG tablet Take 1 tablet (2.5 mg total) by mouth daily.  . Insulin Glargine (BASAGLAR KWIKPEN) 100 UNIT/ML SOPN INJECT 30 TO 36 UNITS UNDER THE SKIN EVERY DAY (Patient taking differently: Inject 30-36 Units into the skin daily. )  . ipratropium-albuterol (DUONEB) 0.5-2.5 (3) MG/3ML SOLN Take 3 mLs by nebulization every 4 (four) hours as needed (for shortness of breath).  . liraglutide (VICTOZA) 18 MG/3ML SOPN Inject 0.3 mLs (1.8 mg total) into the skin daily.  . Multiple Vitamin (MULTIVITAMIN WITH MINERALS) TABS tablet Take 1 tablet by mouth daily.  Marland Kitchen omeprazole (PRILOSEC) 20 MG capsule Take 20 mg by mouth daily.  . ondansetron (ZOFRAN) 4 MG tablet Take 1 tablet (4 mg total) by mouth every 8 (eight) hours as needed for nausea or vomiting.  . pentoxifylline (TRENTAL) 400 MG CR tablet TAKE 1 TABLET BY MOUTH THREE TIMES DAILY FOR CLAUDICATION  . potassium chloride SA (K-DUR,KLOR-CON) 20 MEQ tablet Take 2 tablets (40 mEq total) by mouth 2 (  two) times daily.  . sennosides-docusate sodium (SENOKOT-S) 8.6-50 MG tablet Take 1-2 tablets by mouth 2 (two) times daily.  Marland Kitchen zolpidem (AMBIEN) 10 MG tablet Take 1 tablet (10 mg total) by mouth at bedtime as needed for sleep.  . [DISCONTINUED] fentaNYL (DURAGESIC) 50 MCG/HR Place 1 patch onto the skin every 3 (three) days.  . [DISCONTINUED] furosemide (LASIX) 40 MG tablet Take 1 tablet (40 mg total) by mouth daily.  . [DISCONTINUED] pentoxifylline (TRENTAL) 400 MG CR tablet TAKE 1 TABLET BY MOUTH THREE TIMES DAILY FOR CLAUDICATION  . [DISCONTINUED] zolpidem (AMBIEN) 5 MG tablet Take 1 tablet (5 mg total) by mouth at  bedtime as needed for sleep.   No facility-administered encounter medications on file as of 07/29/2018.     Surgical History: Past Surgical History:  Procedure Laterality Date  . 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 CHOLECYSTECTOMY    . CORONARY ARTERY BYPASS GRAFT      Medical History: Past Medical History:  Diagnosis Date  . Anemia   . CAD (coronary artery disease)   . Chest pain   . Coronary artery disease   . Diabetes mellitus without complication ([place])   . Diastolic CHF ([place])   . Esophageal reflux   . Herpes zoster without mention of complication   . Hyperlipidemia   . Hypertension   . Insomnia   . Lumbago   . Lymphedema   . Neuropathy in diabetes ([place])   . Obstructive chronic bronchitis without exacerbation ([place])   . Other malaise and fatigue   . Stroke ([place])   . Varicose veins     Family History: Family History  Problem Relation Age of Onset  . Diabetes Mother   . Hyperlipidemia Mother   . Hypertension Mother   . Diabetes Father   . Hyperlipidemia Father   . Hypertension Father   . CAD Father     Social History   Socioeconomic History  . Marital status: Married    Spouse name: Not on file  . Number of children: Not on file  . Years of education: Not on file  . Highest education level: Not on file  Occupational History  . Not on file  Social Needs  . Financial resource strain: Not on file  . Food insecurity:    Worry: Not on file    Inability: Not on file  . Transportation needs:    Medical: Not on file    Non-medical: Not on file  Tobacco Use  . Smoking status: Never Smoker  . Smokeless tobacco: Never Used  Substance and Sexual Activity  . Alcohol use: No    Comment: quit alcohol 30 years ago  . Drug use: No  . Sexual activity: Not on file  Lifestyle  . Physical activity:    Days per week: Not on file    Minutes per  session: Not on file  . Stress: Not on file  Relationships  . Social connections:    Talks on phone: Not on file    Gets together: Not on file    Attends religious service: Not on file    Active member of club or organization: Not on file    Attends meetings of clubs or organizations: Not on file    Relationship status: Not on file  . Intimate partner violence:    Fear of current or ex partner: Not on  file    Emotionally abused: Not on file    Physically abused: Not on file    Forced sexual activity: Not on file  Other Topics Concern  . Not on file  Social History Narrative   Lives at home with wife, uses a wheelchair now      Review of Systems  Constitutional: Positive for activity change and fatigue.  HENT: Negative for congestion and postnasal drip.   Respiratory: Positive for shortness of breath. Negative for cough, chest tightness and wheezing.   Cardiovascular: Positive for leg swelling. Negative for chest pain and palpitations.       Elevated blood pressure today.   Gastrointestinal: Negative for constipation, diarrhea, nausea and vomiting.  Endocrine: Negative for cold intolerance, heat intolerance, polydipsia and polyuria.       Blood sugars doing well   Musculoskeletal: Positive for arthralgias, back pain and myalgias.  Skin: Positive for rash and wound.  Allergic/Immunologic: Positive for environmental allergies.  Neurological: Negative for dizziness, light-headedness and headaches.  Hematological: Negative for adenopathy.  Psychiatric/Behavioral: Positive for dysphoric mood. Negative for agitation. The patient is nervous/anxious.     Today's Vitals   07/29/18 1144  BP: (!) 142/84  Pulse: 84  Resp: 16  SpO2: 97%  Weight: 291 lb 12.8 oz (132.4 kg)  Height: 5\' 6"  (1.676 m)   Body mass index is 47.1 kg/m.  Physical Exam Vitals signs and nursing note reviewed.  Constitutional:      General: He is not in acute distress.    Appearance: He is  well-developed. He is obese. He is ill-appearing. He is not diaphoretic.  HENT:     Head: Normocephalic and atraumatic.     Nose: Nose normal.     Mouth/Throat:     Pharynx: No oropharyngeal exudate.  Eyes:     Conjunctiva/sclera: Conjunctivae normal.     Pupils: Pupils are equal, round, and reactive to light.  Neck:     Musculoskeletal: Normal range of motion and neck supple.     Thyroid: No thyromegaly.     Vascular: No JVD.     Trachea: No tracheal deviation.  Cardiovascular:     Rate and Rhythm: Normal rate and regular rhythm.     Heart sounds: Normal heart sounds. No murmur. No friction rub. No gallop.      Comments: Moderate swelling in bilateral lower legs. Both are very red and warm and very tender to palpate even lightly.  Pulmonary:     Effort: Pulmonary effort is normal. No respiratory distress.     Breath sounds: Normal breath sounds. No rales.  Chest:     Chest wall: No tenderness.  Abdominal:     General: Bowel sounds are normal.     Palpations: Abdomen is soft.     Tenderness: There is no abdominal tenderness.  Musculoskeletal: Normal range of motion.     Comments: Moderate to severe lower leg pain, bilaterally. Worse on the right side, especially after he "twisted it" this morning in the bathroom. Pain is severe from just above the heel and into the calf. Hurts to flex and dorsiflex the right foot. Strength is diminished. Having very difficult time putting any weight on the right foot.   Lymphadenopathy:     Cervical: No cervical adenopathy.  Skin:    General: Skin is warm and dry.  Neurological:     Mental Status: He is alert and oriented to person, place, and time. Mental status is at baseline.  Cranial Nerves: No cranial nerve deficit.  Psychiatric:        Behavior: Behavior normal.        Thought Content: Thought content normal.        Judgment: Judgment normal.   Assessment/Plan: 1. Essential hypertension Stable. Continue blood pressure medication as  prescribed.   2. Diabetic polyneuropathy associated with type 2 diabetes mellitus ([place]) Continue diabetic medication and gabapentin as prescribed.   3. Lymphedema of both lower extremities Trental 400mg  should be continued three times daily.  - pentoxifylline (TRENTAL) 400 MG CR tablet; TAKE 1 TABLET BY MOUTH THREE TIMES DAILY FOR CLAUDICATION  Dispense: 270 tablet; Refill: 3  4. Edema of both lower legs due to peripheral venous insufficiency Furosemide 40mg  should be taken daily.  - furosemide (LASIX) 40 MG tablet; Take 1 tablet (40 mg total) by mouth daily.  Dispense: 30 tablet; Refill: 3  5. Chronic pain disorder Continue fentanyl 70mcg/hour patch. Replace patch every 72 hours. New prescription sent to his pharmacy.  - fentaNYL (DURAGESIC) 50 MCG/HR; Place 1 patch onto the skin every 3 (three) days.  Dispense: 10 patch; Refill: 0  6. Primary insomnia May take zolpidem 10mg  at bedtime as needed for insomnia. New prescription sent to his pharmacy.  - zolpidem (AMBIEN) 10 MG tablet; Take 1 tablet (10 mg total) by mouth at bedtime as needed for sleep.  Dispense: 30 tablet; Refill: 3  General Counseling: Rodriquez verbalizes understanding of the findings of todays visit and agrees with plan of treatment. I have discussed any further diagnostic evaluation that may be needed or ordered today. We also reviewed his medications today. he has been encouraged to call the office with any questions or concerns that should arise related to todays visit.  Reviewed risks and possible side effects associated with taking opiates, benzodiazepines and other CNS depressants. Combination of these could cause dizziness and drowsiness. Advised patient not to drive or operate machinery when taking these medications, as patient's and other's life can be at risk and will have consequences. Patient verbalized understanding in this matter. Dependence and abuse for these drugs will be monitored closely. A Controlled substance  policy and procedure is on file which allows [facility] to order a urine drug screen test at any visit. Patient understands and agrees with the plan  This patient was seen by [name] FNP Collaboration with Dr [name] as a part of collaborative care agreement  Meds ordered this encounter  Medications  . zolpidem (AMBIEN) 10 MG tablet    Sig: Take 1 tablet (10 mg total) by mouth at bedtime as needed for sleep.    Dispense:  30 tablet    Refill:  3    Please do not run through insurance. Patient will use good RX card. Thanks.    Order Specific Question:   Supervising Provider    Answer:   [name] [[id number]]  . fentaNYL (DURAGESIC) 50 MCG/HR    Sig: Place 1 patch onto the skin every 3 (three) days.    Dispense:  10 patch    Refill:  0    Order Specific Question:   Supervising Provider    Answer:   [name] [[id number]]  . pentoxifylline (TRENTAL) 400 MG CR tablet    Sig: TAKE 1 TABLET BY MOUTH THREE TIMES DAILY FOR CLAUDICATION    Dispense:  270 tablet    Refill:  3    Order Specific Question:   Supervising Provider  Answer:   [name] [place]  . furosemide (LASIX) 40 MG tablet    Sig: Take 1 tablet (40 mg total) by mouth daily.    Dispense:  30 tablet    Refill:  3    Order Specific Question:   Supervising Provider    Answer:   [name] [[id number]]    Time spent: 62 Minutes      Dr [name] Internal medicine

## 2018-07-29 NOTE — Progress Notes (Signed)
Pt blood pressure elevated taken two times  1st reading 154/77 machine 2nd reading 142/84 manually

## 2018-07-30 ENCOUNTER — Encounter: Payer: Medicare Other | Admitting: Physician Assistant

## 2018-07-30 DIAGNOSIS — E11621 Type 2 diabetes mellitus with foot ulcer: Secondary | ICD-10-CM | POA: Diagnosis not present

## 2018-08-01 NOTE — Progress Notes (Addendum)
MIKLE, STERNBERG ([id number]) Visit Report for 07/30/2018 Arrival Information Details Patient Name: Adam Merritt, Adam Merritt Date of Service: 07/30/2018 11:00 AM Medical Record Number: [id number] Patient Account Number: [id number] Date of Birth/Sex: 01/02/1946 (72 y.o. M) Treating RN: Army Melia Primary Care Keerat Denicola: [name] Other Clinician: Referring Arkeem Harts: [name] Treating Dymin Dingledine/Extender: Melburn Hake, [name]: 16 Visit Information History Since Last Visit Added or deleted any medications: No Patient Arrived: Walker Any new allergies or adverse reactions: No Arrival Time: 11:12 Had a fall or experienced change in No Accompanied By: wife activities of daily living that may affect Transfer Assistance: None risk of falls: Patient Identification Verified: Yes Signs or symptoms of abuse/neglect since last visito No Secondary Verification Process Yes Hospitalized since last visit: No Completed: Implantable device outside of the clinic excluding No Patient Has Alerts: Yes cellular tissue based products placed in the center Patient Alerts: Patient on Blood since last visit: Thinner Has Dressing in Place as Prescribed: Yes Aspirin 325mg  Pain Present Now: Yes Type II Diabetes ABI 5/19 L 1.1 R 0.9 TBI L 0.6 R 0.6 Electronic Signature(s) Signed: 07/31/2018 9:17:40 AM By: Army Melia Entered By: Army Melia on 07/30/2018 11:12:58 Adam Merritt ([id number]) -------------------------------------------------------------------------------- Compression Therapy Details Patient Name: Adam Merritt Date of Service: 07/30/2018 11:00 AM Medical Record Number: [id number] Patient Account Number: [id number] Date of Birth/Sex: 07-02-1945 (72 y.o. M) Treating RN: Montey Hora Primary Care Kimberly Nieland: [name] Other Clinician: Referring Karlene Southard: [name] Treating Amarachukwu Lakatos/Extender: Melburn Hake, [name]: 16 Compression Therapy Performed for Wound  Assessment: Wound #18 Left,Lateral Lower Leg Performed By: Clinician Montey Hora, RN Compression Type: Four Layer Pre Treatment ABI: 1.1 Post Procedure Diagnosis Same as Pre-procedure Electronic Signature(s) Signed: 07/30/2018 12:04:03 PM By: Montey Hora Entered By: Montey Hora on 07/30/2018 12:04:03 Adam Merritt ([id number]) -------------------------------------------------------------------------------- Encounter Discharge Information Details Patient Name: Adam Merritt Date of Service: 07/30/2018 11:00 AM Medical Record Number: [id number] Patient Account Number: [id number] Date of Birth/Sex: April 11, 1946 (72 y.o. M) Treating RN: Montey Hora Primary Care Angelissa Supan: [name] Other Clinician: Referring Matayah Reyburn: [name] Treating Abdullah Rizzi/Extender: Melburn Hake, [name]: 16 Encounter Discharge Information Items Discharge Condition: Stable Ambulatory Status: Walker Discharge Destination: Home Transportation: Private Auto Accompanied By: spouse Schedule Follow-up Appointment: Yes Clinical Summary of Care: Electronic Signature(s) Signed: 07/30/2018 12:05:02 PM By: Montey Hora Entered By: Montey Hora on 07/30/2018 12:05:02 Adam Merritt ([id number]) -------------------------------------------------------------------------------- Lower Extremity Assessment Details Patient Name: Adam Merritt Date of Service: 07/30/2018 11:00 AM Medical Record Number: [id number] Patient Account Number: [id number] Date of Birth/Sex: January 31, 1946 (72 y.o. M) Treating RN: Army Melia Primary Care Yuka Lallier: [name] Other Clinician: Referring Ezrael Sam: [name] Treating Owin Vignola/Extender: Melburn Hake, [name]: 16 Edema Assessment Assessed: [Left: No] [Right: No] Edema: [Left: Yes] [Right: Yes] Calf Left: Right: Point of Measurement: 34 cm From Medial Instep 50 cm 47 cm Ankle Left: Right: Point of Measurement: 12 cm From Medial Instep 37 cm  30 cm Vascular Assessment Pulses: Dorsalis Pedis Palpable: [Left:Yes] [Right:Yes] Posterior Tibial Extremity colors, hair growth, and conditions: Extremity Color: [Left:Hyperpigmented] [Right:Hyperpigmented] Hair Growth on Extremity: [Left:No] [Right:No] Temperature of Extremity: [Left:Warm] [Right:Warm] Capillary Refill: [Left:< 3 seconds] [Right:< 3 seconds] Toe Nail Assessment Left: Right: Thick: Yes Yes Discolored: Yes Yes Deformed: No No Improper Length and Hygiene: No No Electronic Signature(s) Signed: 07/31/2018 9:17:40 AM By: Army Melia Entered By: Army Melia on 07/30/2018 11:25:53 Adam Merritt ([id number]) -------------------------------------------------------------------------------- Multi Wound Chart Details Patient  Name: Adam Merritt, Adam Merritt Date of Service: 07/30/2018 11:00 AM Medical Record Number: [id number] Patient Account Number: [id number] Date of Birth/Sex: [birth year] (72 y.o. M) Treating RN: Montey Hora Primary Care Jahzier Villalon: [name] Other Clinician: Referring Foster Sonnier: [name] Treating Jojo Geving/Extender: Melburn Hake, [name]: 16 Vital Signs Height(in): 46 Pulse(bpm): 80 Weight(lbs): 323 Blood Pressure(mmHg): 156/80 Body Mass Index(BMI): 52 Temperature(F): 98.2 Respiratory Rate 18 (breaths/min): Photos: [N/A:N/A] Wound Location: Left Lower Leg - Lateral N/A N/A Wounding Event: Trauma N/A N/A Primary Etiology: Trauma, Other N/A N/A Comorbid History: Anemia, Lymphedema, N/A N/A Chronic Obstructive Pulmonary Disease (COPD), Sleep Apnea, Congestive Heart Failure, Coronary Artery Disease, Hypertension, Peripheral Venous Disease, Type II Diabetes, Osteoarthritis, Neuropathy, Received Chemotherapy Date Acquired: 07/16/2018 N/A N/A Weeks of Treatment: 2 N/A N/A Wound Status: Open N/A N/A Measurements L x W x D 1.1x1.3x0.1 N/A N/A (cm) Area (cm) : 1.123 N/A N/A Volume (cm) : 0.112 N/A N/A % Reduction in Area: 86.40%  N/A N/A % Reduction in Volume: 86.40% N/A N/A Classification: Partial Thickness N/A N/A Exudate Amount: Medium N/A N/A Exudate Type: Serous N/A N/A Exudate Color: amber N/A N/A Wound Margin: Flat and Intact N/A N/A Granulation Amount: Large (67-100%) N/A N/A Granulation Quality: Pink N/A N/A Adam Merritt, Adam Merritt ([id number]) Necrotic Amount: Small (1-33%) N/A N/A Exposed Structures: Fat Layer (Subcutaneous N/A N/A Tissue) Exposed: Yes Fascia: No Tendon: No Muscle: No Joint: No Bone: No Epithelialization: Small (1-33%) N/A N/A Periwound Skin Texture: Excoriation: No N/A N/A Induration: No Callus: No Crepitus: No Rash: No Scarring: No Periwound Skin Moisture: Dry/Scaly: Yes N/A N/A Maceration: No Periwound Skin Color: Atrophie Blanche: No N/A N/A Cyanosis: No Ecchymosis: No Erythema: No Hemosiderin Staining: No Mottled: No Pallor: No Rubor: No Temperature: No Abnormality N/A N/A Tenderness on Palpation: No N/A N/A Wound Preparation: Ulcer Cleansing: N/A N/A Rinsed/Irrigated with Saline, Other: soap and water Topical Anesthetic Applied: Other: lidocaine 4% Treatment Notes Electronic Signature(s) Signed: 07/30/2018 5:30:34 PM By: Montey Hora Entered By: Montey Hora on 07/30/2018 11:48:01 Adam Merritt ([id number]) -------------------------------------------------------------------------------- Adam Merritt Details Patient Name: Adam Merritt Date of Service: 07/30/2018 11:00 AM Medical Record Number: [id number] Patient Account Number: [id number] Date of Birth/Sex: 04-05-1946 (72 y.o. M) Treating RN: Montey Hora Primary Care Justiss Gerbino: [name] Other Clinician: Referring Shavelle Runkel: [name] Treating Sherene Plancarte/Extender: Melburn Hake, [name]: 47 Active Inactive Abuse / Safety / Falls / Self Care Management Nursing Diagnoses: Potential for falls Goals: Patient will remain injury free related to falls Date Initiated:  04/09/2018 Target Resolution Date: 07/13/2018 Goal Status: Active Interventions: Assess fall risk on admission and as needed Notes: Orientation to the Wound Care Program Nursing Diagnoses: Knowledge deficit related to the wound healing center program Goals: Patient/caregiver will verbalize understanding of the [facility] Date Initiated: 04/09/2018 Target Resolution Date: 07/13/2018 Goal Status: Active Interventions: Provide education on orientation to the wound center Notes: Venous Leg Ulcer Nursing Diagnoses: Actual venous Insuffiency (use after diagnosis is confirmed) Goals: Patient will maintain optimal edema control Date Initiated: 04/09/2018 Target Resolution Date: 07/13/2018 Goal Status: Active Interventions: Assess peripheral edema status every visit. Adam Merritt, Adam Merritt ([id number]) Compression as ordered Provide education on venous insufficiency Notes: Electronic Signature(s) Signed: 07/30/2018 5:30:34 PM By: Montey Hora Entered By: Montey Hora on 07/30/2018 11:47:12 Adam Merritt, Adam Merritt ([id number]) -------------------------------------------------------------------------------- Pain Assessment Details Patient Name: Adam Merritt Date of Service: 07/30/2018 11:00 AM Medical Record Number: [id number] Patient Account Number: [id number] Date of Birth/Sex: 07-20-1945 (72 y.o. M) Treating RN:  Army Melia Primary Care Cj Beecher: [name] Other Clinician: Referring Mariamawit Depaoli: [name] Treating Laurice Kimmons/Extender: Melburn Hake, [name]: 16 Active Problems Location of Pain Severity and Description of Pain Patient Has Paino Yes Site Locations Rate the pain. Current Pain Level: 1 Pain Management and Medication Current Pain Management: Electronic Signature(s) Signed: 07/31/2018 9:17:40 AM By: Army Melia Entered By: Army Melia on 07/30/2018 11:13:08 Adam Merritt  ([id number]) -------------------------------------------------------------------------------- Patient/Caregiver Education Details Patient Name: Adam Merritt Date of Service: 07/30/2018 11:00 AM Medical Record Number: [id number] Patient Account Number: [id number] Date of Birth/Gender: 10/06/1945 (72 y.o. M) Treating RN: Montey Hora Primary Care Physician: [name] Other Clinician: Referring Physician: [name] Treating Physician/Extender: Sharalyn Ink in Treatment: 16 Education Assessment Education Provided To: Patient and Caregiver Education Topics Provided Venous: Handouts: Other: leg elevation Methods: Explain/Verbal Responses: State content correctly Electronic Signature(s) Signed: 07/30/2018 5:30:34 PM By: Montey Hora Entered By: Montey Hora on 07/30/2018 12:04:35 Adam Merritt ([id number]) -------------------------------------------------------------------------------- Wound Assessment Details Patient Name: Adam Merritt Date of Service: 07/30/2018 11:00 AM Medical Record Number: [id number] Patient Account Number: [id number] Date of Birth/Sex: Nov 01, 1945 (72 y.o. M) Treating RN: Army Melia Primary Care Magda Muise: [name] Other Clinician: Referring Annaliese Saez: [name] Treating Natalyn Szymanowski/Extender: STONE III, [name]: 16 Wound Status Wound Number: 18 Primary Trauma, Other Etiology: Wound Location: Left Lower Leg - Lateral Wound Open Wounding Event: Trauma Status: Date Acquired: 07/16/2018 Comorbid Anemia, Lymphedema, Chronic Obstructive Weeks Of Treatment: 2 History: Pulmonary Disease (COPD), Sleep Apnea, Clustered Wound: No Congestive Heart Failure, Coronary Artery Disease, Hypertension, Peripheral Venous Disease, Type II Diabetes, Osteoarthritis, Neuropathy, Received Chemotherapy Photos Photo Uploaded By: Army Melia on 07/30/2018 11:43:51 Wound Measurements Length: (cm) 1.1 Width: (cm) 1.3 Depth: (cm) 0.1 Area:  (cm) 1.123 Volume: (cm) 0.112 % Reduction in Area: 86.4% % Reduction in Volume: 86.4% Epithelialization: Small (1-33%) Tunneling: No Undermining: No Wound Description Classification: Partial Thickness Foul Od Wound Margin: Flat and Intact Slough/ Exudate Amount: Medium Exudate Type: Serous Exudate Color: amber or After Cleansing: No Fibrino Yes Wound Bed Granulation Amount: Large (67-100%) Exposed Structure Granulation Quality: Pink Fascia Exposed: No Necrotic Amount: Small (1-33%) Fat Layer (Subcutaneous Tissue) Exposed: Yes Necrotic Quality: Adherent Slough Tendon Exposed: No Muscle Exposed: No Joint Exposed: No Adam Merritt, Adam Merritt ([id number]) Bone Exposed: No Periwound Skin Texture Texture Color No Abnormalities Noted: No No Abnormalities Noted: No Callus: No Atrophie Blanche: No Crepitus: No Cyanosis: No Excoriation: No Ecchymosis: No Induration: No Erythema: No Rash: No Hemosiderin Staining: No Scarring: No Mottled: No Pallor: No Moisture Rubor: No No Abnormalities Noted: No Dry / Scaly: Yes Temperature / Pain Maceration: No Temperature: No Abnormality Wound Preparation Ulcer Cleansing: Rinsed/Irrigated with Saline, Other: soap and water, Topical Anesthetic Applied: Other: lidocaine 4%, Electronic Signature(s) Signed: 07/31/2018 9:17:40 AM By: Army Melia Entered By: Army Melia on 07/30/2018 11:22:37 Adam Merritt ([id number]) -------------------------------------------------------------------------------- Vitals Details Patient Name: Adam Merritt Date of Service: 07/30/2018 11:00 AM Medical Record Number: [id number] Patient Account Number: [id number] Date of Birth/Sex: 1945-06-15 (72 y.o. M) Treating RN: Army Melia Primary Care Meggin Ola: [name] Other Clinician: Referring Kamani Magnussen: [name] Treating Tanelle Lanzo/Extender: Melburn Hake, [name]: 16 Vital Signs Time Taken: 11:13 Temperature (F): 98.2 Height (in):  66 Pulse (bpm): 80 Weight (lbs): 323 Respiratory Rate (breaths/min): 18 Body Mass Index (BMI): 52.1 Blood Pressure (mmHg): 156/80 Reference Range: 80 - 120 mg / dl Electronic Signature(s) Signed: 07/31/2018 9:17:40 AM By: Army Melia Entered By: Nicki Reaper,  Dajea on 07/30/2018 11:13:42

## 2018-08-02 DIAGNOSIS — E11621 Type 2 diabetes mellitus with foot ulcer: Secondary | ICD-10-CM | POA: Diagnosis not present

## 2018-08-03 NOTE — Progress Notes (Signed)
Adam Merritt ([id number]) Visit Report for 07/30/2018 Chief Complaint Document Details Patient Name: Adam Merritt, Adam Merritt Date of Service: 07/30/2018 11:00 AM Medical Record Number: [id number] Patient Account Number: [id number] Date of Birth/Sex: 1945-08-01 (72 y.o. M) Treating RN: Montey Hora Primary Care Provider: [name] Other Clinician: Referring Provider: [name] Treating Provider/Extender: Melburn Hake, [name]: 16 Information Obtained from: Patient Chief Complaint he is here for evaluation of bilateral lower extremity lymphedema right right toe and left LE ulcers Electronic Signature(s) Signed: 08/01/2018 5:15:29 PM By: Worthy Keeler PA-C Entered By: Worthy Keeler on 07/31/2018 23:24:36 Adam Merritt ([id number]) -------------------------------------------------------------------------------- HPI Details Patient Name: Adam Merritt Date of Service: 07/30/2018 11:00 AM Medical Record Number: [id number] Patient Account Number: [id number] Date of Birth/Sex: 03/11/1946 (72 y.o. M) Treating RN: Montey Hora Primary Care Provider: [name] Other Clinician: Referring Provider: [name] Treating Provider/Extender: Melburn Hake, [name]: 16 History of Present Illness HPI Description: 10/18/17-He is here for initial evaluation of bilateral lower extremity ulcerations in the presence of venous insufficiency and lymphedema. He has been seen by vascular medicine in the past, Dr. Lucky Cowboy, last seen in 2016. He does have a history of abnormal ABIs, which is to be expected given his lymphedema and venous insufficiency. According to Epic, it appears that all attempts for arterial evaluation and/or angiography were not follow through with by patient. He does have a history of being seen in lymphedema clinic in 2018, stopped going approximately 6 months ago stating "it didn't do any good". He does not have lymphedema pumps, he does not have custom fit  compression wrap/stockings. He is diabetic and his recent A1c last month was 7.6. He admits to chronic bilateral lower extremity pain, no change in pain since blister and ulceration development. He is currently being treated with Levaquin for bronchitis. He has home health and we will continue. 10/25/17-He is here in follow-up evaluation for bilateral lower extremity ulcerationssubtle he remains on Levaquin for bronchitis. Right lower extremity with no evidence of drainage or ulceration, persistent left lower extremity ulceration. He states that home health has not been out since his appointment. He went to [facility] on Tuesday, studies revealed: RIGHT ABI 0.9, TBI 0.6 LEFT ABI 1.1, TBI 0.6 with triphasic flow bilaterally. We will continue his same treatment plan. He has been educated on compression therapy and need for elevation. He will benefit from lymphedema pumps 11/01/17-He is here in follow-up evaluation for left lower extremity ulcer. The right lower extremity remains healed. He has home health services, but they have not been out to see the patient for 2-3 weeks. He states it home health physical therapy changed his dressing yesterday after therapy; he placed Ace wrap compression. We are still waiting for lymphedema pumps, reordered d/t need for company change. 11/08/17-He is here in follow-up evaluation for left lower extremity ulcer. It is improved. Edema is significantly improved with compression therapy. We will continue with same treatment plan and he will follow-up next week. No word regarding lymphedema pumps 11/15/17-He is here in follow-up evaluation for left lower extremity ulcer. He is healed and will be discharged from wound care services. I have reached out to [facility] regarding his lymphedema pumps. They have been unable to reach the patient; the contact number they had with the patient's wife's cell phone and she has not answered any unrecognized  calls. Contact should be made today, trial planned for next week; [facility] will continue to  follow 11/27/17 on evaluation today patient has multiple blistered areas over the right lower extremity his left lower extremity appears to be doing okay. These blistered areas show signs of no infection which is great news. With that being said he did have some necrotic skin overlying which was mechanically debrided away with saline and gauze today without complication. Overall post debridement the wounds appear to be doing better but in general his swelling seems to be increased. This is obviously not good news. I think this is what has given rise to the blisters. 12/04/17 on evaluation today patient presents for follow-up concerning his bilateral lower extremity edema in the right lower extremity ulcers. He has been tolerating the dressing changes without complication. With that being said he has had no real issues with the wraps which is also good news. Overall I'm pleased with the progress he's been making. 12/11/17 on evaluation today patient appears to be doing rather well in regard to his right lateral lower extremity ulcer. He's been tolerating the dressing changes without complication. Fortunately there does not appear to be any evidence of infection at this time. Overall I'm pleased with the progress that is being made. Unfortunately he has been in the hospital due to having what sounds to be a stomach virus/flu fortunately that is starting to get better. 12/18/17 on evaluation today patient actually appears to be doing very well in regard to his bilateral lower extremities the swelling is under fairly good control his lymphedema pumps are still not up and running quite yet. With that being said he does have several areas of opening noted as far as wounds are concerned mainly over the left lower extremity. With that being said I do believe once he gets lymphedema pumps this would least help you  mention some the fluid and preventing this from occurring. Hopefully that will be set up soon sleeves are Artie in place at his home he just waiting for the machine. 12/25/17 on evaluation today patient actually appears to be doing excellent in fact all of his ulcers appear to have resolved his Adam Merritt, Adam Merritt. ([id number]) legs appear very well. I do think he needs compression stockings we have discussed this and they are actually going to go to [place] today to elastic therapy to get this fitted for him. I think that is definitely a good thing to do. Readmission: 04/09/18 upon evaluation today this patient is seen for readmission due to bilateral lower extremity lymphedema. He has significant swelling of his extremities especially on the left although the right is also swollen he has weeping from both sides. There are no obvious open wounds at this point. Fortunately he has been doing fairly well for quite a bit of time since I last saw him. Nonetheless unfortunately this seems to have reopened and is giving quite a bit of trouble. He states this began about a week ago when he first called [place] to get in to be seen. No fevers, chills, nausea, or vomiting noted at this time. He has not been using his lymphedema pumps due to the fact that they won't fit on his leg at this point likewise is also not been using his compression for essentially the same reason. 04/16/18 upon evaluation today patient actually appears to be doing a little better in regard to the fluid in his bilateral lower extremities. With that being said he's had three falls since I saw him last week. He also states that he's been feeling very poorly. I was  concerned last week and feel like that the concern is still there as far as the congestion in his chest is concerned he seems to be breathing about the same as last week but again he states he's very weak he's not even able to walk further than from the chair to the door. His wife had  to buy a wheelchair just to be able to get them out of the house to get to the appointment today. This has me very concerned. 04/23/18 on evaluation today patient actually appears to be doing much better than last week's evaluation. At that time actually had to transport him to the ER via EMS and he subsequently was admitted for acute pulmonary edema, acute renal failure, and acute congestive heart failure. Fortunately he is doing much better. Apparently they did dialyze him and were able to take off roughly 35 pounds of fluid. Nonetheless he is feeling much better both in regard to his breathing and he's able to get around much better at this time compared to previous. Overall I'm very happy with how things are at this time. There does not appear to be any evidence of infection currently. No fevers, chills, nausea, or vomiting noted at this time. 04/30/2018 patient seen today for follow-up and management of bilateral lower extremity lymphedema. He did express being more sad today than usual due to the recent loss of his dog. He states that he has been compliant with using the lymphedema pumps. However he does admit a minute over the last 2-3 days he has not been using the pumps due to the recent loss of his dog. At this time there is no drainage or open wounds to his lower extremities. The left leg edema is measuring smaller today. Still has a significant amount of edema on bilateral lower extremities With dry flaky skin. He will be referred to the lymphedema clinic for further management. Will continue 3 layer compression wraps and follow-up in 1-2 weeks.Denies any pain, fever, chairs recently. No recent falls or injuries reported during this visit. 05/07/18 on evaluation today patient actually appears to be doing very well in regard to his lower extremities in general all things considered. With that being said he is having some pain in the legs just due to the amount of swelling. He does have an  area where he had a blister on the left lateral lower extremity this is open at this point other than that there's nothing else weeping at this time. 05/14/18 on evaluation today patient actually appears to be doing excellent all things considered in regard to his lower extremities. He still has a couple areas of weeping on each leg which has continued to be the issue for him. He does have an appointment with the lymphedema clinic although this isn't until February 2020. That was the earliest they had. In the meantime he has continued to tolerate the compression wraps without complication. 05/28/18 on evaluation today patient actually appears to be doing more poorly in regard to his left lower extremity where he has a wound open at this point. He also had a fall where he subsequently injured his right great toe which has led to an open wound at the site unfortunately. He has been tolerating the dressing changes without complication in general as far as the wraps are concerned that he has not been putting any dressing on the left 1st toe ulcer site. 06/11/18 on evaluation today patient appears to be doing much worse in regard to his  bilateral lower extremity ulcers. He has been tolerating the dressing changes without complication although his legs have not been wrapped more recently. Overall I am not very pleased with the way his legs appear. I do believe he needs to be back in compression wraps he still has not received his compression wraps from the [facility] hospital as of yet. 06/18/18 on evaluation today patient actually appears to be doing significantly better than last time I saw him. He has been tolerating the compression wraps without complication in the circumferential ulcers especially appear to be doing much better. His toe ulcer on the right in regard to the great toe is better although not as good as the legs in my pinion. No fevers chills noted Adam Merritt, Adam Merritt ([id number]) 07/02/18 on evaluation  today patient appears to be doing much better in regard to his lower extremity ulcers. Unfortunately since I last saw him he's had the distal portion of his right great toe if he dated it sounds as if this actually went downhill very quickly. I had only seen him a few days prior and the toe did not appear to be infected at that point subsequently became infected very rapidly and it was decided by the surgeon that the distal portion of the toe needed to be removed. The patient seems to be doing well in this regard he tells me. With that being said his lower extremities are doing better from the standpoint of the wounds although he is significantly swollen at this point. 07/09/18 on evaluation today patient appears to be doing better in regard to the wounds on his lower extremities. In fact everything is almost completely healed he is just a small area on the left posterior lower extremity that is open at this point. 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 With that being said he needs some kind of compression it does sound like he may not be wearing his compression, that is the wraps, during the entire time between when he's here visit to visit. Apparently his wife took the current one off because it began to "fall apart". 07/16/18 on evaluation today patient appears to be extremely swollen especially in regard to his left lower extremity unfortunately. He also has a new skin tear over the left lower extremity and there's a smaller area on the right lower extremity as well. Unfortunately this seems to be due in part to blistering and fluid buildup in his leg. He did get the reduction wraps that were ordered by the [facility] hospital for him to go to lymphedema clinic. With that being said his wounds on the legs have not healed  to the point to where they would likely accept them as a patient lymphedema clinic currently. We need to try to get this to heal. With that being said he's been taking his wraps off which is not doing him any favors at this point. In fact this is probably quite counterproductive compared to what needs to occur. We will likely need to increase to a four layer compression wrap and continue to also utilize elevation and he has to keep the wraps on not take them off as he's been doing currently hasn't had a wrap on since Saturday. 07/23/18 on evaluation today patient appears to be doing much better in regard to his bilateral lower extremities.  In fact his left lower extremity which was the largest is actually 15 cm smaller today compared to what it was last time he was here in our clinic. This is obviously good news after just one week. Nonetheless the differences he actually kept the wraps on during the entire week this time. That's not typical for him. I do believe he understands a little bit better now the severity of the situation and why it's important for him to keep these wraps on. 07/30/18 on evaluation today patient actually appears to be doing rather well in regard to his lower extremities. His legs are much smaller than they have been in the past and he actually has only one very small rudder superficial region remaining that is not closed on the left lateral/posterior lower extremity even this is almost completely close. I do believe likely next week he will be healed without any complications. I do think we need to continue the wraps however this seems to be beneficial for him. I also think it may be a good time for [place] to go ahead and see about getting the appointment with the lymphedema clinic which is supposed to be made for him in order to keep this moving along and hopefully get them into compression wraps that will in the end help him to remain healed. Electronic Signature(s) Signed:  08/01/2018 5:15:29 PM By: Worthy Keeler PA-C Entered By: Worthy Keeler on 07/31/2018 23:25:04 Adam Merritt ([id number]) -------------------------------------------------------------------------------- Physical Exam Details Patient Name: Adam Merritt Date of Service: 07/30/2018 11:00 AM Medical Record Number: [id number] Patient Account Number: [id number] Date of Birth/Sex: May 21, 1946 (72 y.o. M) Treating RN: Montey Hora Primary Care Provider: [name] Other Clinician: Referring Provider: [name] Treating Provider/Extender: STONE III, [name]: 37 Constitutional Well-nourished and well-hydrated in no acute distress. Respiratory normal breathing without difficulty. clear to auscultation bilaterally. Cardiovascular regular rate and rhythm with normal S1, S2. Psychiatric this patient is able to make decisions and demonstrates good insight into disease process. Alert and Oriented x 3. pleasant and cooperative. Notes Patient's wound bed actually appears to show signs of being open just on the left posterior/lateral lower extremity. With that being said there does not appear to be any significant openings otherwise which is excellent news overall very pleased with how things are appearing today. Patient likewise is extremely happy with how things are doing he states he even able to walk more appropriately which is excellent news as well. Electronic Signature(s) Signed: 08/01/2018 5:15:29 PM By: Worthy Keeler PA-C Entered By: Worthy Keeler on 07/31/2018 23:25:36 Adam Merritt ([id number]) -------------------------------------------------------------------------------- Physician Orders Details Patient Name: Adam Merritt Date of Service: 07/30/2018 11:00 AM Medical Record Number: [id number] Patient Account Number: [id number] Date of Birth/Sex: May 22, 1946 (72 y.o. M) Treating RN: Montey Hora Primary Care Provider: [name] Other Clinician: Referring  Provider: [name] Treating Provider/Extender: Melburn Hake, [name]: 42 Verbal / Phone Orders: No Diagnosis Coding Wound Cleansing o Cleanse wound with mild soap and water Skin Barriers/Peri-Wound Care o Moisturizing lotion - to both lower legs Primary Wound Dressing Wound #18 Left,Lateral Lower Leg o Silver Alginate Secondary Dressing Wound #18 Left,Lateral Lower Leg o ABD pad - to any areas of draining lymphedema Dressing Change Frequency Wound #18 Left,Lateral Lower Leg o Other: - Twice weekly - nurse visit on Friday Follow-up Appointments Wound #18 Left,Lateral Lower Leg o Return Appointment in 1 week. - nurse visit on Friday o Nurse Visit as  needed Edema Control Wound #18 Left,Lateral Lower Leg o 4 Layer Compression System - Bilateral Services and Therapies o Lymphedema Clinic Electronic Signature(s) Signed: 07/30/2018 5:30:34 PM By: Montey Hora Signed: 08/01/2018 5:15:29 PM By: Worthy Keeler PA-C Entered By: Montey Hora on 07/30/2018 11:48:58 Adam Merritt ([id number]) -------------------------------------------------------------------------------- Problem List Details Patient Name: Adam Merritt Date of Service: 07/30/2018 11:00 AM Medical Record Number: [id number] Patient Account Number: [id number] Date of Birth/Sex: June 13, 1945 (72 y.o. M) Treating RN: Montey Hora Primary Care Provider: [name] Other Clinician: Referring Provider: [name] Treating Provider/Extender: Melburn Hake, [name]: 16 Active Problems ICD-10 Evaluated Encounter Code Description Active Date Today Diagnosis I89.0 Lymphedema, not elsewhere classified 04/09/2018 No Yes L97.512 Non-pressure chronic ulcer of other part of right foot with fat 05/28/2018 No Yes layer exposed L97.822 Non-pressure chronic ulcer of other part of left lower leg with 05/28/2018 No Yes fat layer exposed L98.492 Non-pressure chronic ulcer of skin of  other sites with fat layer 06/11/2018 No Yes exposed E66.9 Obesity, unspecified 04/09/2018 No Yes Inactive Problems Resolved Problems ICD-10 Code Description Active Date Resolved Date L03.115 Cellulitis of right lower limb 04/09/2018 04/09/2018 L03.116 Cellulitis of left lower limb 04/09/2018 04/09/2018 Electronic Signature(s) Signed: 08/01/2018 5:15:29 PM By: Worthy Keeler PA-C Entered By: Worthy Keeler on 07/31/2018 23:24:30 Adam Merritt ([id number]) Adam Merritt, Adam Merritt ([id number]) -------------------------------------------------------------------------------- Progress Note Details Patient Name: Adam Merritt Date of Service: 07/30/2018 11:00 AM Medical Record Number: [id number] Patient Account Number: [id number] Date of Birth/Sex: Oct 25, 1945 (72 y.o. M) Treating RN: Montey Hora Primary Care Provider: [name] Other Clinician: Referring Provider: [name] Treating Provider/Extender: Melburn Hake, [name]: 16 Subjective Chief Complaint Information obtained from Patient he is here for evaluation of bilateral lower extremity lymphedema right right toe and left LE ulcers History of Present Illness (HPI) 10/18/17-He is here for initial evaluation of bilateral lower extremity ulcerations in the presence of venous insufficiency and lymphedema. He has been seen by vascular medicine in the past, Dr. Lucky Cowboy, last seen in 2016. He does have a history of abnormal ABIs, which is to be expected given his lymphedema and venous insufficiency. According to Epic, it appears that all attempts for arterial evaluation and/or angiography were not follow through with by patient. He does have a history of being seen in lymphedema clinic in 2018, stopped going approximately 6 months ago stating "it didn't do any good". He does not have lymphedema pumps, he does not have custom fit compression wrap/stockings. He is diabetic and his recent A1c last month was 7.6. He admits to chronic bilateral  lower extremity pain, no change in pain since blister and ulceration development. He is currently being treated with Levaquin for bronchitis. He has home health and we will continue. 10/25/17-He is here in follow-up evaluation for bilateral lower extremity ulcerationssubtle he remains on Levaquin for bronchitis. Right lower extremity with no evidence of drainage or ulceration, persistent left lower extremity ulceration. He states that home health has not been out since his appointment. He went to [facility] on Tuesday, studies revealed: RIGHT ABI 0.9, TBI 0.6 LEFT ABI 1.1, TBI 0.6 with triphasic flow bilaterally. We will continue his same treatment plan. He has been educated on compression therapy and need for elevation. He will benefit from lymphedema pumps 11/01/17-He is here in follow-up evaluation for left lower extremity ulcer. The right lower extremity remains healed. He has home health services, but they have not been out to see  the patient for 2-3 weeks. He states it home health physical therapy changed his dressing yesterday after therapy; he placed Ace wrap compression. We are still waiting for lymphedema pumps, reordered d/t need for company change. 11/08/17-He is here in follow-up evaluation for left lower extremity ulcer. It is improved. Edema is significantly improved with compression therapy. We will continue with same treatment plan and he will follow-up next week. No word regarding lymphedema pumps 11/15/17-He is here in follow-up evaluation for left lower extremity ulcer. He is healed and will be discharged from wound care services. I have reached out to [facility] regarding his lymphedema pumps. They have been unable to reach the patient; the contact number they had with the patient's wife's cell phone and she has not answered any unrecognized calls. Contact should be made today, trial planned for next week; [facility] will continue to follow 11/27/17 on  evaluation today patient has multiple blistered areas over the right lower extremity his left lower extremity appears to be doing okay. These blistered areas show signs of no infection which is great news. With that being said he did have some necrotic skin overlying which was mechanically debrided away with saline and gauze today without complication. Overall post debridement the wounds appear to be doing better but in general his swelling seems to be increased. This is obviously not good news. I think this is what has given rise to the blisters. 12/04/17 on evaluation today patient presents for follow-up concerning his bilateral lower extremity edema in the right lower extremity ulcers. He has been tolerating the dressing changes without complication. With that being said he has had no real issues with the wraps which is also good news. Overall I'm pleased with the progress he's been making. 12/11/17 on evaluation today patient appears to be doing rather well in regard to his right lateral lower extremity ulcer. He's been tolerating the dressing changes without complication. Fortunately there does not appear to be any evidence of infection at this time. Overall I'm pleased with the progress that is being made. Unfortunately he has been in the hospital due to having what sounds to be a stomach virus/flu fortunately that is starting to get better. 12/18/17 on evaluation today patient actually appears to be doing very well in regard to his bilateral lower extremities the Adam Merritt, Adam Merritt. ([id number]) swelling is under fairly good control his lymphedema pumps are still not up and running quite yet. With that being said he does have several areas of opening noted as far as wounds are concerned mainly over the left lower extremity. With that being said I do believe once he gets lymphedema pumps this would least help you mention some the fluid and preventing this from occurring. Hopefully that will be set up  soon sleeves are Artie in place at his home he just waiting for the machine. 12/25/17 on evaluation today patient actually appears to be doing excellent in fact all of his ulcers appear to have resolved his legs appear very well. I do think he needs compression stockings we have discussed this and they are actually going to go to [place] today to elastic therapy to get this fitted for him. I think that is definitely a good thing to do. Readmission: 04/09/18 upon evaluation today this patient is seen for readmission due to bilateral lower extremity lymphedema. He has significant swelling of his extremities especially on the left although the right is also swollen he has weeping from both sides. There are no  obvious open wounds at this point. Fortunately he has been doing fairly well for quite a bit of time since I last saw him. Nonetheless unfortunately this seems to have reopened and is giving quite a bit of trouble. He states this began about a week ago when he first called [place] to get in to be seen. No fevers, chills, nausea, or vomiting noted at this time. He has not been using his lymphedema pumps due to the fact that they won't fit on his leg at this point likewise is also not been using his compression for essentially the same reason. 04/16/18 upon evaluation today patient actually appears to be doing a little better in regard to the fluid in his bilateral lower extremities. With that being said he's had three falls since I saw him last week. He also states that he's been feeling very poorly. I was concerned last week and feel like that the concern is still there as far as the congestion in his chest is concerned he seems to be breathing about the same as last week but again he states he's very weak he's not even able to walk further than from the chair to the door. His wife had to buy a wheelchair just to be able to get them out of the house to get to the appointment today. This has me very  concerned. 04/23/18 on evaluation today patient actually appears to be doing much better than last week's evaluation. At that time actually had to transport him to the ER via EMS and he subsequently was admitted for acute pulmonary edema, acute renal failure, and acute congestive heart failure. Fortunately he is doing much better. Apparently they did dialyze him and were able to take off roughly 35 pounds of fluid. Nonetheless he is feeling much better both in regard to his breathing and he's able to get around much better at this time compared to previous. Overall I'm very happy with how things are at this time. There does not appear to be any evidence of infection currently. No fevers, chills, nausea, or vomiting noted at this time. 04/30/2018 patient seen today for follow-up and management of bilateral lower extremity lymphedema. He did express being more sad today than usual due to the recent loss of his dog. He states that he has been compliant with using the lymphedema pumps. However he does admit a minute over the last 2-3 days he has not been using the pumps due to the recent loss of his dog. At this time there is no drainage or open wounds to his lower extremities. The left leg edema is measuring smaller today. Still has a significant amount of edema on bilateral lower extremities With dry flaky skin. He will be referred to the lymphedema clinic for further management. Will continue 3 layer compression wraps and follow-up in 1-2 weeks.Denies any pain, fever, chairs recently. No recent falls or injuries reported during this visit. 05/07/18 on evaluation today patient actually appears to be doing very well in regard to his lower extremities in general all things considered. With that being said he is having some pain in the legs just due to the amount of swelling. He does have an area where he had a blister on the left lateral lower extremity this is open at this point other than that there's  nothing else weeping at this time. 05/14/18 on evaluation today patient actually appears to be doing excellent all things considered in regard to his lower extremities. He still has  a couple areas of weeping on each leg which has continued to be the issue for him. He does have an appointment with the lymphedema clinic although this isn't until February 2020. That was the earliest they had. In the meantime he has continued to tolerate the compression wraps without complication. 05/28/18 on evaluation today patient actually appears to be doing more poorly in regard to his left lower extremity where he has a wound open at this point. He also had a fall where he subsequently injured his right great toe which has led to an open wound at the site unfortunately. He has been tolerating the dressing changes without complication in general as far as the wraps are concerned that he has not been putting any dressing on the left 1st toe ulcer site. 06/11/18 on evaluation today patient appears to be doing much worse in regard to his bilateral lower extremity ulcers. He has been tolerating the dressing changes without complication although his legs have not been wrapped more recently. Overall I am not very pleased with the way his legs appear. I do believe he needs to be back in compression wraps he still has not received his compression wraps from the [facility] hospital as of yet. Adam Merritt, Adam Merritt ([id number]) 06/18/18 on evaluation today patient actually appears to be doing significantly better than last time I saw him. He has been tolerating the compression wraps without complication in the circumferential ulcers especially appear to be doing much better. His toe ulcer on the right in regard to the great toe is better although not as good as the legs in my pinion. No fevers chills noted 07/02/18 on evaluation today patient appears to be doing much better in regard to his lower extremity ulcers. Unfortunately since I last  saw him he's had the distal portion of his right great toe if he dated it sounds as if this actually went downhill very quickly. I had only seen him a few days prior and the toe did not appear to be infected at that point subsequently became infected very rapidly and it was decided by the surgeon that the distal portion of the toe needed to be removed. The patient seems to be doing well in this regard he tells me. With that being said his lower extremities are doing better from the standpoint of the wounds although he is significantly swollen at this point. 07/09/18 on evaluation today patient appears to be doing better in regard to the wounds on his lower extremities. In fact everything is almost completely healed he is just a small area on the left posterior lower extremity that is open at this point. 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 With that being said he needs some kind of compression it does sound like he may not be wearing his compression, that is the wraps, during the entire time between when he's here visit to visit. Apparently his wife took the current one off because it began to "fall apart". 07/16/18 on evaluation today patient appears to be extremely swollen especially in regard to his left lower extremity unfortunately. He also has a new skin tear over the left lower extremity and there's a smaller area on the right lower extremity as well. Unfortunately this seems to be due in part to blistering and fluid buildup in his leg.  He did get the reduction wraps that were ordered by the [facility] hospital for him to go to lymphedema clinic. With that being said his wounds on the legs have not healed to the point to where they would likely accept them as a patient lymphedema clinic currently. We need to try to  get this to heal. With that being said he's been taking his wraps off which is not doing him any favors at this point. In fact this is probably quite counterproductive compared to what needs to occur. We will likely need to increase to a four layer compression wrap and continue to also utilize elevation and he has to keep the wraps on not take them off as he's been doing currently hasn't had a wrap on since Saturday. 07/23/18 on evaluation today patient appears to be doing much better in regard to his bilateral lower extremities. In fact his left lower extremity which was the largest is actually 15 cm smaller today compared to what it was last time he was here in our clinic. This is obviously good news after just one week. Nonetheless the differences he actually kept the wraps on during the entire week this time. That's not typical for him. I do believe he understands a little bit better now the severity of the situation and why it's important for him to keep these wraps on. 07/30/18 on evaluation today patient actually appears to be doing rather well in regard to his lower extremities. His legs are much smaller than they have been in the past and he actually has only one very small rudder superficial region remaining that is not closed on the left lateral/posterior lower extremity even this is almost completely close. I do believe likely next week he will be healed without any complications. I do think we need to continue the wraps however this seems to be beneficial for him. I also think it may be a good time for [place] to go ahead and see about getting the appointment with the lymphedema clinic which is supposed to be made for him in order to keep this moving along and hopefully get them into compression wraps that will in the end help him to remain healed. Patient History Information obtained from Patient. Family History Cancer - Paternal Grandparents, Kidney Disease - Siblings, Lung Disease -  Mother, No family history of Diabetes, Heart Disease, Hereditary Spherocytosis, Hypertension, Seizures, Stroke, Thyroid Problems, Tuberculosis. Social History Never smoker, Marital Status - Married, Alcohol Use - Never, Drug Use - No History, Caffeine Use - Never. Medical History Eyes Denies history of Cataracts, Glaucoma, Optic Neuritis Ear/Nose/Mouth/Throat Adam Merritt, Adam Merritt ([id number]) Denies history of Chronic sinus problems/congestion, Middle ear problems Hematologic/Lymphatic Patient has history of Anemia - aplastic anemia, Lymphedema Denies history of Hemophilia, Human Immunodeficiency Virus, Sickle Cell Disease Respiratory Patient has history of Chronic Obstructive Pulmonary Disease (COPD), Sleep Apnea Denies history of Aspiration, Asthma, Pneumothorax, Tuberculosis Cardiovascular Patient has history of Congestive Heart Failure, Coronary Artery Disease, Hypertension, Peripheral Venous Disease Denies history of Angina, Arrhythmia, Deep Vein Thrombosis, Hypotension, Myocardial Infarction, Peripheral Arterial Disease, Phlebitis, Vasculitis Gastrointestinal Denies history of Cirrhosis , Colitis, Crohn s, Hepatitis A, Hepatitis B, Hepatitis C Endocrine Patient has history of Type II Diabetes Denies history of Type I Diabetes Genitourinary Denies history of End Stage Renal Disease Immunological Denies history of Lupus Erythematosus, Raynaud s, Scleroderma Integumentary (Skin) Denies history of History of Burn, History of pressure wounds Musculoskeletal Patient has history of Osteoarthritis Denies history of Gout, Rheumatoid Arthritis,  Osteomyelitis Neurologic Patient has history of Neuropathy Denies history of Dementia, Quadriplegia, Paraplegia, Seizure Disorder Oncologic Patient has history of Received Chemotherapy - last dose 2006 Denies history of Received Radiation Psychiatric Denies history of Anorexia/bulimia, Confinement Anxiety Hospitalization/Surgery History -  11/03/2013, [facility], bronchitis. Medical And Surgical History Notes Respiratory home O2 at night as needed Cardiovascular varicose veins Neurologic CVA - 1992 Review of Systems (ROS) Constitutional Symptoms (General Health) Denies complaints or symptoms of Fever, Chills. Respiratory The patient has no complaints or symptoms. Cardiovascular The patient has no complaints or symptoms. Psychiatric The patient has no complaints or symptoms. Adam Merritt, Adam Merritt ([id number]) Objective Constitutional Well-nourished and well-hydrated in no acute distress. Vitals Time Taken: 11:13 AM, Height: 66 in, Weight: 323 lbs, BMI: 52.1, Temperature: 98.2 F, Pulse: 80 bpm, Respiratory Rate: 18 breaths/min, Blood Pressure: 156/80 mmHg. Respiratory normal breathing without difficulty. clear to auscultation bilaterally. Cardiovascular regular rate and rhythm with normal S1, S2. Psychiatric this patient is able to make decisions and demonstrates good insight into disease process. Alert and Oriented x 3. pleasant and cooperative. General Notes: Patient's wound bed actually appears to show signs of being open just on the left posterior/lateral lower extremity. With that being said there does not appear to be any significant openings otherwise which is excellent news overall very pleased with how things are appearing today. Patient likewise is extremely happy with how things are doing he states he even able to walk more appropriately which is excellent news as well. Integumentary (Hair, Skin) Wound #18 status is Open. Original cause of wound was Trauma. The wound is located on the Left,Lateral Lower Leg. The wound measures 1.1cm length x 1.3cm width x 0.1cm depth; 1.123cm^2 area and 0.112cm^3 volume. There is Fat Layer (Subcutaneous Tissue) Exposed exposed. There is no tunneling or undermining noted. There is a medium amount of serous drainage noted. The wound margin is flat and intact. There is large (67-100%)  pink granulation within the wound bed. There is a small (1-33%) amount of necrotic tissue within the wound bed including Adherent Slough. The periwound skin appearance exhibited: Dry/Scaly. The periwound skin appearance did not exhibit: Callus, Crepitus, Excoriation, Induration, Rash, Scarring, Maceration, Atrophie Blanche, Cyanosis, Ecchymosis, Hemosiderin Staining, Mottled, Pallor, Rubor, Erythema. Periwound temperature was noted as No Abnormality. 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 There was a Four Layer Compression Therapy Procedure with a pre-treatment ABI of 1.1 by Montey Hora, RN. Post procedure Diagnosis Wound #18: Same as Pre-Procedure Plan Wound Cleansing: Cleanse wound with mild soap and water Skin Barriers/Peri-Wound Care: Moisturizing lotion - to both lower legs Primary Wound Dressing: Wound #18 Left,Lateral Lower Leg: Silver Alginate Secondary Dressing: Wound #18 Left,Lateral Lower Leg: ABD pad - to any areas of draining lymphedema Dressing Change Frequency: Wound #18 Left,Lateral Lower Leg: Other: - Twice weekly - nurse visit on Friday Follow-up Appointments: Wound #18 Left,Lateral Lower Leg: Return Appointment in 1 week. - nurse visit on Friday Nurse Visit as needed Edema Control: Wound #18 Left,Lateral Lower Leg: 4 Layer Compression System - Bilateral Services and Therapies ordered were: Lymphedema Clinic I'm gonna recommend we continue with the above  wound care measures for the next week patients in agreement that  plan. Subsequently we will see were things stand at follow-up in one weeks time. If anything changes or worsens meantime patient will contact the office and let me know. Otherwise I hope is that he will continue to show signs of good improvement. Hopefully we can also get them in rather quickly with the lymphedema clinic. He Artie has the orders from his primary care at the [facility] hospital and what needs to be done at the lymphedema. Please see above for specific wound care orders. We will see patient for re-evaluation in 1 week(s) here in the clinic. If anything worsens or changes patient will contact our office for additional recommendations. Electronic Signature(s) Signed: 08/01/2018 5:15:29 PM By: Worthy Keeler PA-C Entered By: Worthy Keeler on 07/31/2018 23:25:50 Adam Merritt ([id number]) -------------------------------------------------------------------------------- ROS/PFSH Details Patient Name: Adam Merritt Date of Service: 07/30/2018 11:00 AM Medical Record Number: [id number] Patient Account Number: [id number] Date of Birth/Sex: January 20, 1946 (72 y.o. M) Treating RN: Montey Hora Primary Care Provider: [name] Other Clinician: Referring Provider: [name] Treating Provider/Extender: Melburn Hake, [name]: 16 Information Obtained From Patient Wound History Do you currently have one or more open woundso No Have you tested positive for osteomyelitis (bone infection)o No Have you had any tests for circulation on your legso Yes Constitutional Symptoms (General Health) Complaints and Symptoms: Negative for: Fever; Chills Eyes Medical History: Negative for: Cataracts; Glaucoma; Optic Neuritis Ear/Nose/Mouth/Throat Medical History: Negative for: Chronic sinus problems/congestion; Middle ear problems Hematologic/Lymphatic Medical History: Positive for: Anemia - aplastic anemia; Lymphedema Negative for: Hemophilia; Human Immunodeficiency Virus; Sickle Cell  Disease Respiratory Complaints and Symptoms: No Complaints or Symptoms Medical History: Positive for: Chronic Obstructive Pulmonary Disease (COPD); Sleep Apnea Negative for: Aspiration; Asthma; Pneumothorax; Tuberculosis Past Medical History Notes: home O2 at night as needed Cardiovascular Complaints and Symptoms: No Complaints or Symptoms Medical History: Positive for: Congestive Heart Failure; Coronary Artery Disease; Hypertension; Peripheral Venous Disease Negative for: Angina; Arrhythmia; Deep Vein Thrombosis; Hypotension; Myocardial Infarction; Peripheral Arterial Disease; Phlebitis; Vasculitis Past Medical History Notes: Adam Merritt, Adam Merritt ([id number]) varicose veins Gastrointestinal Medical History: Negative for: Cirrhosis ; Colitis; Crohnos; Hepatitis A; Hepatitis B; Hepatitis C Endocrine Medical History: Positive for: Type II Diabetes Negative for: Type I Diabetes Time with diabetes: since 2006 Treated with: Insulin Blood sugar tested every day: Yes Tested : Blood sugar testing results: Breakfast: 209 Genitourinary Medical History: Negative for: End Stage Renal Disease Immunological Medical History: Negative for: Lupus Erythematosus; Raynaudos; Scleroderma Integumentary (Skin) Medical History: Negative for: History of Burn; History of pressure wounds Musculoskeletal Medical History: Positive for: Osteoarthritis Negative for: Gout; Rheumatoid Arthritis; Osteomyelitis Neurologic Medical History: Positive for: Neuropathy Negative for: Dementia; Quadriplegia; Paraplegia; Seizure Disorder Past Medical History Notes: CVA - 1992 Oncologic Medical History: Positive for: Received Chemotherapy - last dose 2006 Negative for: Received Radiation Psychiatric Complaints and Symptoms: No Complaints or Symptoms Medical History: Adam Merritt, Adam Merritt ([id number]) Negative for: Anorexia/bulimia; Confinement Anxiety Immunizations Pneumococcal Vaccine: Received Pneumococcal  Vaccination: Yes Implantable Devices No devices added Hospitalization / Surgery History Name of Hospital Purpose of Hospitalization/Surgery Date [facility] bronchitis 11/03/2013 Family and Social History Cancer: Yes - Paternal Grandparents; Diabetes: No; Heart Disease: No; Hereditary Spherocytosis: No; Hypertension: No; Kidney Disease: Yes - Siblings; Lung Disease: Yes - Mother; Seizures: No; Stroke: No; Thyroid Problems: No; Tuberculosis: No; Never smoker; Marital Status - Married; Alcohol Use: Never; Drug Use: No History; Caffeine Use: Never; Financial Concerns: No;  Food, Games developer or Shelter Needs: No; Support System Lacking: No; Transportation Concerns: No; Advanced Directives: No; Patient does not want information on Advanced Directives Physician Affirmation I have reviewed and agree with the above information. Electronic Signature(s) Signed: 08/01/2018 4:28:00 PM By: Montey Hora Signed: 08/01/2018 5:15:29 PM By: Worthy Keeler PA-C Entered By: Worthy Keeler on 07/31/2018 23:25:20 Adam Merritt, JANEK ([id number]) -------------------------------------------------------------------------------- SuperBill Details Patient Name: Adam Merritt Date of Service: 07/30/2018 Medical Record Number: [id number] Patient Account Number: [id number] Date of Birth/Sex: 10/25/1945 (72 y.o. M) Treating RN: Montey Hora Primary Care Provider: [name] Other Clinician: Referring Provider: [name] Treating Provider/Extender: Melburn Hake, [name]: 16 Diagnosis Coding ICD-10 Codes Code Description I89.0 Lymphedema, not elsewhere classified L97.512 Non-pressure chronic ulcer of other part of right foot with fat layer exposed L97.822 Non-pressure chronic ulcer of other part of left lower leg with fat layer exposed L98.492 Non-pressure chronic ulcer of skin of other sites with fat layer exposed E66.9 Obesity, unspecified Facility Procedures CPT4: Description Modifier Quantity Code  [id number] BILATERAL: Application of multi-layer venous compression system; leg (below 1 knee), including ankle and foot. Physician Procedures CPT4 Code Description: [id number] - WC PHYS LEVEL 4 - EST PT ICD-10 Diagnosis Description I89.0 Lymphedema, not elsewhere classified L97.512 Non-pressure chronic ulcer of other part of right foot with fat L97.822 Non-pressure chronic ulcer of other  part of left lower leg with L98.492 Non-pressure chronic ulcer of skin of other sites with fat layer Modifier: layer exposed fat layer expos exposed Quantity: 1 ed Electronic Signature(s) Signed: 08/01/2018 5:15:29 PM By: Worthy Keeler PA-C Previous Signature: 07/30/2018 12:04:15 PM Version By: Montey Hora Entered By: Worthy Keeler on 07/31/2018 23:26:09

## 2018-08-04 NOTE — Progress Notes (Signed)
LOURDES, MANNING ([id number]) Visit Report for 08/02/2018 Arrival Information Details Patient Name: Adam Merritt, Adam Merritt Date of Service: 08/02/2018 10:00 AM Medical Record Number: [id number] Patient Account Number: [id number] Date of Birth/Sex: [birth year] (72 y.o. M) Treating RN: Harold Barban Primary Care Erick Oxendine: [name] Other Clinician: Referring Steffani Dionisio: [name] Treating Dayshon Roback/Extender: Melburn Hake, [name]: 16 Visit Information History Since Last Visit Added or deleted any medications: No Patient Arrived: Walker Any new allergies or adverse reactions: No Arrival Time: 09:59 Had a fall or experienced change in No Accompanied By: spouse activities of daily living that may affect Transfer Assistance: None risk of falls: Patient Identification Verified: Yes Signs or symptoms of abuse/neglect since last visito No Secondary Verification Process Yes Hospitalized since last visit: No Completed: Has Dressing in Place as Prescribed: Yes Patient Has Alerts: Yes Has Compression in Place as Prescribed: Yes Patient Alerts: Patient on Blood Pain Present Now: No Thinner Aspirin 325mg  Type II Diabetes ABI 5/19 L 1.1 R 0.9 TBI L 0.6 R 0.6 Electronic Signature(s) Signed: 08/02/2018 5:19:20 PM By: Harold Barban Entered By: Harold Barban on 08/02/2018 10:00:21 Adam Merritt ([id number]) -------------------------------------------------------------------------------- Compression Therapy Details Patient Name: Adam Merritt Date of Service: 08/02/2018 10:00 AM Medical Record Number: [id number] Patient Account Number: [id number] Date of Birth/Sex: [birth year] (72 y.o. M) Treating RN: Army Melia Primary Care Padraig Nhan: [name] Other Clinician: Referring Danis Pembleton: [name] Treating Agape Hardiman/Extender: STONE III, [name]: 16 Compression Therapy Performed for Wound Assessment: NonWound Condition Lymphedema - Bilateral Leg Performed By:  Clinician Army Melia, RN Compression Type: Four Layer Pre Treatment ABI: 1.1 Electronic Signature(s) Signed: 08/02/2018 1:28:43 PM By: Army Melia Entered By: Army Melia on 08/02/2018 13:28:43 Adam Merritt ([id number]) -------------------------------------------------------------------------------- Encounter Discharge Information Details Patient Name: Adam Merritt Date of Service: 08/02/2018 10:00 AM Medical Record Number: [id number] Patient Account Number: [id number] Date of Birth/Sex: [birth year] (72 y.o. M) Treating RN: Army Melia Primary Care Mystie Ormand: [name] Other Clinician: Referring Raziel Koenigs: [name] Treating Dimitria Ketchum/Extender: Melburn Hake, [name]: 16 Encounter Discharge Information Items Discharge Condition: Stable Ambulatory Status: Walker Discharge Destination: Home Transportation: Private Auto Accompanied By: wife Schedule Follow-up Appointment: Yes Clinical Summary of Care: Electronic Signature(s) Signed: 08/02/2018 1:29:58 PM By: Army Melia Entered By: Army Melia on 08/02/2018 13:29:58 Adam Merritt ([id number]) -------------------------------------------------------------------------------- Patient/Caregiver Education Details Patient Name: Adam Merritt Date of Service: 08/02/2018 10:00 AM Medical Record Number: [id number] Patient Account Number: [id number] Date of Birth/Gender: September 16, 1945 (72 y.o. M) Treating RN: Army Melia Primary Care Physician: [name] Other Clinician: Referring Physician: [name] Treating Physician/Extender: Sharalyn Ink in Treatment: 16 Education Assessment Education Provided To: Patient Education Topics Provided Venous: Handouts: Controlling Swelling with Multilayered Compression Wraps Methods: Demonstration, Explain/Verbal Responses: State content correctly Wound/Skin Impairment: Handouts: Caring for Your Ulcer Methods: Demonstration, Explain/Verbal Responses: State content  correctly Electronic Signature(s) Signed: 08/02/2018 4:47:10 PM By: Army Melia Entered By: Army Melia on 08/02/2018 13:29:38 Adam Merritt ([id number]) -------------------------------------------------------------------------------- Wound Assessment Details Patient Name: Adam Merritt Date of Service: 08/02/2018 10:00 AM Medical Record Number: [id number] Patient Account Number: [id number] Date of Birth/Sex: 1946/02/04 (72 y.o. M) Treating RN: Harold Barban Primary Care Terralyn Matsumura: [name] Other Clinician: Referring Nachman Sundt: [name] Treating Kashara Blocher/Extender: STONE III, [name]: 16 Wound Status Wound Number: 18 Primary Trauma, Other Etiology: Wound Location: Left Lower Leg - Lateral Wound Open Wounding Event: Trauma Status: Date Acquired: 07/16/2018 Comorbid Anemia, Lymphedema, Chronic Obstructive Weeks Of Treatment:  2 History: Pulmonary Disease (COPD), Sleep Apnea, Clustered Wound: No Congestive Heart Failure, Coronary Artery Disease, Hypertension, Peripheral Venous Disease, Type II Diabetes, Osteoarthritis, Neuropathy, Received Chemotherapy Wound Measurements Length: (cm) 1.1 Width: (cm) 1.3 Depth: (cm) 0.3 Area: (cm) 1.123 Volume: (cm) 0.337 % Reduction in Area: 86.4% % Reduction in Volume: 59.2% Epithelialization: Small (1-33%) Tunneling: No Undermining: No Wound Description Classification: Partial Thickness Foul Od Wound Margin: Flat and Intact Slough/ Exudate Amount: Medium Exudate Type: Serous Exudate Color: amber or After Cleansing: No Fibrino Yes Wound Bed Granulation Amount: Large (67-100%) Exposed Structure Granulation Quality: Pink Fascia Exposed: No Necrotic Amount: Small (1-33%) Fat Layer (Subcutaneous Tissue) Exposed: Yes Necrotic Quality: Adherent Slough Tendon Exposed: No Muscle Exposed: No Joint Exposed: No Bone Exposed: No Periwound Skin Texture Texture Color No Abnormalities Noted: No No Abnormalities  Noted: No Callus: No Atrophie Blanche: No Crepitus: No Cyanosis: No Excoriation: No Ecchymosis: No Induration: No Erythema: No Rash: No Hemosiderin Staining: No Scarring: No Mottled: No Pallor: No Moisture Rubor: No No Abnormalities Noted: No ADIT, RIDDLES ([id number]) Dry / Scaly: Yes Temperature / Pain Maceration: No Temperature: No Abnormality Wound Preparation Ulcer Cleansing: Rinsed/Irrigated with Saline, Other: soap and water, Topical Anesthetic Applied: Other: lidocaine 4%, Treatment Notes Wound #18 (Left, Lateral Lower Leg) Notes Silver Cell, ABD, 4 layer wrap bilateral Electronic Signature(s) Signed: 08/02/2018 5:19:20 PM By: Harold Barban Entered By: Harold Barban on 08/02/2018 28:00:34

## 2018-08-05 ENCOUNTER — Encounter: Payer: Medicare Other | Admitting: Occupational Therapy

## 2018-08-06 ENCOUNTER — Encounter: Payer: Medicare Other | Attending: Physician Assistant | Admitting: Physician Assistant

## 2018-08-06 DIAGNOSIS — I2510 Atherosclerotic heart disease of native coronary artery without angina pectoris: Secondary | ICD-10-CM | POA: Insufficient documentation

## 2018-08-06 DIAGNOSIS — E1140 Type 2 diabetes mellitus with diabetic neuropathy, unspecified: Secondary | ICD-10-CM | POA: Insufficient documentation

## 2018-08-06 DIAGNOSIS — J449 Chronic obstructive pulmonary disease, unspecified: Secondary | ICD-10-CM | POA: Insufficient documentation

## 2018-08-06 DIAGNOSIS — G8929 Other chronic pain: Secondary | ICD-10-CM | POA: Insufficient documentation

## 2018-08-06 DIAGNOSIS — I872 Venous insufficiency (chronic) (peripheral): Secondary | ICD-10-CM | POA: Insufficient documentation

## 2018-08-06 DIAGNOSIS — W19XXXA Unspecified fall, initial encounter: Secondary | ICD-10-CM | POA: Diagnosis not present

## 2018-08-06 DIAGNOSIS — Z6841 Body Mass Index (BMI) 40.0 and over, adult: Secondary | ICD-10-CM | POA: Insufficient documentation

## 2018-08-06 DIAGNOSIS — M1990 Unspecified osteoarthritis, unspecified site: Secondary | ICD-10-CM | POA: Diagnosis not present

## 2018-08-06 DIAGNOSIS — L97822 Non-pressure chronic ulcer of other part of left lower leg with fat layer exposed: Secondary | ICD-10-CM | POA: Diagnosis not present

## 2018-08-06 DIAGNOSIS — G4730 Sleep apnea, unspecified: Secondary | ICD-10-CM | POA: Insufficient documentation

## 2018-08-06 DIAGNOSIS — I509 Heart failure, unspecified: Secondary | ICD-10-CM | POA: Diagnosis not present

## 2018-08-06 DIAGNOSIS — L97512 Non-pressure chronic ulcer of other part of right foot with fat layer exposed: Secondary | ICD-10-CM | POA: Insufficient documentation

## 2018-08-06 DIAGNOSIS — I110 Hypertensive heart disease with heart failure: Secondary | ICD-10-CM | POA: Insufficient documentation

## 2018-08-06 DIAGNOSIS — Z8673 Personal history of transient ischemic attack (TIA), and cerebral infarction without residual deficits: Secondary | ICD-10-CM | POA: Diagnosis not present

## 2018-08-06 DIAGNOSIS — I890 Lymphedema, not elsewhere classified: Secondary | ICD-10-CM | POA: Insufficient documentation

## 2018-08-06 DIAGNOSIS — E669 Obesity, unspecified: Secondary | ICD-10-CM | POA: Diagnosis not present

## 2018-08-06 DIAGNOSIS — E11621 Type 2 diabetes mellitus with foot ulcer: Secondary | ICD-10-CM | POA: Diagnosis present

## 2018-08-06 DIAGNOSIS — L98492 Non-pressure chronic ulcer of skin of other sites with fat layer exposed: Secondary | ICD-10-CM | POA: Diagnosis not present

## 2018-08-06 DIAGNOSIS — K5900 Constipation, unspecified: Secondary | ICD-10-CM | POA: Diagnosis not present

## 2018-08-07 DIAGNOSIS — F5101 Primary insomnia: Secondary | ICD-10-CM | POA: Insufficient documentation

## 2018-08-09 DIAGNOSIS — E11621 Type 2 diabetes mellitus with foot ulcer: Secondary | ICD-10-CM | POA: Diagnosis not present

## 2018-08-09 NOTE — Progress Notes (Signed)
Patient ID: Adam Merritt, male    DOB: [birth year], 73 y.o.   MRN: [id number]  HPI  Adam Merritt is a 73 y/o male with a history of CAD, DM, hyperlipidemia, HTN, stroke, COPD, lymphedema and chronic heart failure.   Echo report from 09/16/16 reviewed and showed an EF of 65-70%.  Admitted 06/21/2018 due to sepsis osteomyelitis of the toe. Infectious disease and wound consults obtained. 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 PICC line placed due to needing 2 weeks of antibiotics. Discharged after 4 days. Admitted 04/16/18 due to acute on chronic HF. Cardiology consult obtained. Diuresed and discharged after 2 days.   Adam Merritt presents today for a follow-up visit with a chief complaint of moderate shortness of breath upon minimal exertion. Adam Merritt describes this as chronic in nature having been present for several years. Adam Merritt has associated fatigue, lymphedema and leg weakness along with this. Adam Merritt denies any difficulty sleeping, dizziness, abdominal distention, palpitations, chest pain or weight gain. Continues to go to the wound center to have his legs wrapped.  Past Medical History:  Diagnosis Date  . Anemia   . CAD (coronary artery disease)   . Chest pain   . Coronary artery disease   . Diabetes mellitus without complication ([place])   . Diastolic CHF ([place])   . Esophageal reflux   . Herpes zoster without mention of complication   . Hyperlipidemia   . Hypertension   . Insomnia   . Lumbago   . Lymphedema   . Neuropathy in diabetes ([place])   . Obstructive chronic bronchitis without exacerbation ([place])   . Other malaise and fatigue   . Stroke ([place])   . Varicose veins    Past Surgical History:  Procedure Laterality Date  . 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 CHOLECYSTECTOMY    . CORONARY ARTERY BYPASS GRAFT     Family History  Problem Relation Age of Onset  . Diabetes Mother   . Hyperlipidemia  Mother   . Hypertension Mother   . Diabetes Father   . Hyperlipidemia Father   . Hypertension Father   . CAD Father    Social History   Tobacco Use  . Smoking status: Never Smoker  . Smokeless tobacco: Never Used  Substance Use Topics  . Alcohol use: No    Comment: quit alcohol 30 years ago   No Known Allergies  Prior to Admission medications   Medication Sig Start Date End Date Taking? Authorizing Provider  albuterol (PROVENTIL HFA;VENTOLIN HFA) 108 (90 Base) MCG/ACT inhaler INHALE 1 PUFF INTO THE LUNGS EVERY 6 HOURS AS NEEDED FOR WHEEZING OR SHORTNESS OF BREATH Patient taking differently: Inhale 1 puff into the lungs every 6 (six) hours as needed for wheezing or shortness of breath.  03/11/18  Yes Allyne Gee, MD  aspirin 325 MG EC tablet Take 325 mg by mouth daily.   Yes [provider]  atorvastatin (LIPITOR) 40 MG tablet Take 40 mg by mouth at bedtime.    Yes [provider]  carvedilol (COREG) 25 MG tablet Take 25 mg by mouth 2 (two) times daily with a meal.   Yes [provider]  febuxostat (ULORIC) 40 MG tablet Take 1 tablet (40 mg total) by mouth daily. 02/12/18  Yes [name], MD  fentaNYL (DURAGESIC) 50 MCG/HR Place 1 patch onto the skin every 3 (three) days. 07/29/18  Yes  Ronnell Freshwater, NP  furosemide (LASIX) 40 MG tablet Take 1 tablet (40 mg total) by mouth daily. 07/29/18  Yes Boscia, Greer Ee, NP  gabapentin (NEURONTIN) 300 MG capsule Take 1 capsule po in AM and 1 capsule po in pm. Take 2 capsules po at bedtime. Patient taking differently: Take 2 cap in the morning, 1 cap in the evening and 1 cap at night. 05/27/18  Yes Boscia, Heather E, NP  indapamide (LOZOL) 2.5 MG tablet Take 1 tablet (2.5 mg total) by mouth daily. 11/27/17  Yes [name], MD  Insulin Glargine (BASAGLAR KWIKPEN) 100 UNIT/ML SOPN INJECT 30 TO 36 UNITS UNDER THE SKIN EVERY DAY Patient taking differently: Inject 30 Units into the skin daily.  03/26/18  Yes  Scarboro, Audie Clear, NP  ipratropium-albuterol (DUONEB) 0.5-2.5 (3) MG/3ML SOLN Take 3 mLs by nebulization every 4 (four) hours as needed (for shortness of breath). 09/10/17  Yes [name], MD  liraglutide (VICTOZA) 18 MG/3ML SOPN Inject 0.3 mLs (1.8 mg total) into the skin daily. 05/23/18  Yes Ronnell Freshwater, NP  Multiple Vitamin (MULTIVITAMIN WITH MINERALS) TABS tablet Take 1 tablet by mouth daily.   Yes [provider]  pentoxifylline (TRENTAL) 400 MG CR tablet TAKE 1 TABLET BY MOUTH THREE TIMES DAILY FOR CLAUDICATION 07/29/18  Yes Boscia, Heather E, NP  potassium chloride SA (K-DUR,KLOR-CON) 20 MEQ tablet Take 2 tablets (40 mEq total) by mouth 2 (two) times daily. Patient taking differently: Take 20 mEq by mouth 3 (three) times daily.  06/13/18  Yes Hackney, Otila Kluver A, FNP  sennosides-docusate sodium (SENOKOT-S) 8.6-50 MG tablet Take 1-2 tablets by mouth 2 (two) times daily.   Yes [provider]  zolpidem (AMBIEN) 10 MG tablet Take 1 tablet (10 mg total) by mouth at bedtime as needed for sleep. 07/29/18  Yes Boscia, Greer Ee, NP  HYDROcodone-acetaminophen (NORCO/VICODIN) 5-325 MG tablet Take 1 tablet by mouth every 6 (six) hours as needed for moderate pain or severe pain. Patient not taking: Reported on 08/13/2018 06/25/18   Loletha Grayer, MD     Review of Systems  Constitutional: Positive for fatigue (tire easily). Negative for appetite change.  HENT: Positive for hearing loss. Negative for congestion and sore throat.   Eyes: Negative.   Respiratory: Positive for shortness of breath (with minimal exertion). Negative for chest tightness.   Cardiovascular: Positive for leg swelling. Negative for chest pain and palpitations.  Gastrointestinal: Negative for abdominal distention and abdominal pain.  Endocrine: Negative.   Genitourinary: Negative.   Musculoskeletal: Negative for back pain and neck pain.  Skin: Positive for wound (healing).  Allergic/Immunologic: Negative.    Neurological: Positive for weakness (in legs). Negative for dizziness and light-headedness.  Hematological: Negative for adenopathy. Does not bruise/bleed easily.  Psychiatric/Behavioral: Negative for dysphoric mood and sleep disturbance (sleeping on 2 pillows; oxgyen on 4L at bedtime). The patient is not nervous/anxious.    Vitals:   08/13/18 1135  BP: 133/77  Pulse: 88  Resp: 18  SpO2: 98%  Weight: 287 lb (130.2 kg)  Height: 5\' 6"  (1.676 m)   Wt Readings from Last 3 Encounters:  08/13/18 287 lb (130.2 kg)  07/29/18 291 lb 12.8 oz (132.4 kg)  07/19/18 292 lb (132.5 kg)   Lab Results  Component Value Date   CREATININE 1.73 (H) 06/25/2018   CREATININE 1.68 (H) 06/24/2018   CREATININE 1.94 (H) 06/23/2018    Physical Exam Vitals signs and nursing note reviewed.  Constitutional:  Appearance: Adam Merritt is well-developed.  HENT:     Head: Normocephalic and atraumatic.  Neck:     Musculoskeletal: Normal range of motion and neck supple.  Cardiovascular:     Rate and Rhythm: Normal rate and regular rhythm.  Pulmonary:     Effort: Pulmonary effort is normal. No respiratory distress.     Breath sounds: No wheezing or rales.  Abdominal:     General: There is no distension.     Palpations: Abdomen is soft.  Musculoskeletal:     Right lower leg: Adam Merritt exhibits no tenderness. Edema (lymphedema) present.     Left lower leg: Adam Merritt exhibits no tenderness. Edema (lymphedema) present.  Skin:    General: Skin is warm and dry.  Neurological:     Mental Status: Adam Merritt is alert and oriented to person, place, and time.  Psychiatric:        Behavior: Behavior normal.    Assessment & Plan:  1: Chronic heart failure with preserved ejection fraction- - NYHA class III - euvolemic today although does have chronic lymphedema - weighing sporadically; reviewed the importance of weighing daily first thing in the morning after using the bathroom, write the weight down and call for an overnight weight gain  of >2 pounds or a weekly weight gain of >5 pounds - weight down 17 pounds from last visit here 3 months ago - adds salt "on occasion" but does eat out almost every meal because they don't like to cook.  - saw cardiology Lyndel Pleasure) 03/07/18 - BNP 04/16/18 was 29.0 - says that Adam Merritt's received his flu vaccine for this season  - PharmD reconciled medications with the patient  2: HTN- - BP looks good today - saw PCP Junius Creamer) 07/29/2018 - BMP from 08/08/2018 (from the [place]) reviewed and showed sodium 137, potassium 3.4, creatinine 1.41 and GFR 49  3: DM- - A1c 06/21/2018 was 7.0%  4: COPD-  - supposed to be wearing oxygen at 4L at bedtime but doesn't wear it every night; encouraged him to wear it every night when Adam Merritt goes to bed - saw pulmonologist Versie Starks) 07/19/2018  5: Lymphedema- - follows at the wound center  - does have compression boots at home that Adam Merritt tries to use when his legs aren't so swollen that they can't fit - has been elevating his legs more than Adam Merritt used to  - going to make an appointment at [place] [facility] once his wound heals completely  Patient did not bring his medications nor a list. Each medication was verbally reviewed with the patient and Adam Merritt was encouraged to bring the bottles to every visit to confirm accuracy of list.  Return in 6 months or sooner for any questions/problems before then.

## 2018-08-09 NOTE — Progress Notes (Signed)
TRYONE, KILLE ([id number]) Visit Report for 08/06/2018 Arrival Information Details Patient Name: Adam Merritt, Adam Merritt Date of Service: 08/06/2018 1:30 PM Medical Record Number: [id number] Patient Account Number: [id number] Date of Birth/Sex: 1945/11/27 (72 y.o. M) Treating RN: Army Melia Primary Care Mckaylee Dimalanta: [name] Other Clinician: Referring Jacie Tristan: [name] Treating Lakina Mcintire/Extender: Melburn Hake, [name]: 43 Visit Information History Since Last Visit Added or deleted any medications: No Patient Arrived: [place] Any new allergies or adverse reactions: No Arrival Time: 13:58 Had a fall or experienced change in No Accompanied By: wife activities of daily living that may affect Transfer Assistance: None risk of falls: Patient Has Alerts: Yes Signs or symptoms of abuse/neglect since last visito No Patient Alerts: Patient on Blood Thinner Hospitalized since last visit: No Aspirin 325mg  Implantable device outside of the clinic excluding No Type II Diabetes cellular tissue based products placed in the center ABI 5/19 L 1.1 R 0.9 since last visit: TBI L 0.6 R 0.6 Has Dressing in Place as Prescribed: Yes Pain Present Now: No Electronic Signature(s) Signed: 08/06/2018 4:08:43 PM By: Army Melia Entered By: Army Melia on 08/06/2018 13:58:39 Adam Merritt ([id number]) -------------------------------------------------------------------------------- Compression Therapy Details Patient Name: Adam Merritt Date of Service: 08/06/2018 1:30 PM Medical Record Number: [id number] Patient Account Number: [id number] Date of Birth/Sex: 1946-06-05 (72 y.o. M) Treating RN: Montey Hora Primary Care Malorie Bigford: [name] Other Clinician: Referring Mackenize Delgadillo: [name] Treating Tamika Nou/Extender: Melburn Hake, [name]: 17 Compression Therapy Performed for Wound Assessment: NonWound Condition Lymphedema - Bilateral Leg Performed By: Clinician Montey Hora,  RN Compression Type: Four Layer Pre Treatment ABI: 1.1 Post Procedure Diagnosis Same as Pre-procedure Electronic Signature(s) Signed: 08/06/2018 5:24:46 PM By: Montey Hora Entered By: Montey Hora on 08/06/2018 14:32:08 Adam Merritt ([id number]) -------------------------------------------------------------------------------- Lower Extremity Assessment Details Patient Name: Adam Merritt Date of Service: 08/06/2018 1:30 PM Medical Record Number: [id number] Patient Account Number: [id number] Date of Birth/Sex: 08-27-1945 (72 y.o. M) Treating RN: Army Melia Primary Care Tanayah Squitieri: [name] Other Clinician: Referring Josiel Gahm: [name] Treating Lashawne Dura/Extender: Melburn Hake, [name]: 17 Edema Assessment Assessed: [Left: No] [Right: No] Edema: [Left: Yes] [Right: Yes] Calf Left: Right: Point of Measurement: 34 cm From Medial Instep 53 cm 48 cm Ankle Left: Right: Point of Measurement: 12 cm From Medial Instep 34 cm 28 cm Vascular Assessment Pulses: Dorsalis Pedis Palpable: [Left:Yes] [Right:Yes] Posterior Tibial Extremity colors, hair growth, and conditions: Extremity Color: [Left:Hyperpigmented] [Right:Hyperpigmented] Hair Growth on Extremity: [Left:No] [Right:No] Temperature of Extremity: [Left:Warm] [Right:Warm] Capillary Refill: [Left:< 3 seconds] [Right:< 3 seconds] Toe Nail Assessment Left: Right: Thick: No No Discolored: No No Deformed: No No Improper Length and Hygiene: No No Electronic Signature(s) Signed: 08/06/2018 4:08:43 PM By: Army Melia Entered By: Army Melia on 08/06/2018 14:09:29 Adam Merritt ([id number]) -------------------------------------------------------------------------------- Multi Wound Chart Details Patient Name: Adam Merritt Date of Service: 08/06/2018 1:30 PM Medical Record Number: [id number] Patient Account Number: [id number] Date of Birth/Sex: 06-11-1945 (72 y.o. M) Treating RN: Montey Hora Primary Care  Rafaela Dinius: [name] Other Clinician: Referring Daven Montz: [name] Treating Bolden Hagerman/Extender: Melburn Hake, [name]: 17 Vital Signs Height(in): 69 Pulse(bpm): 4 Weight(lbs): 323 Blood Pressure(mmHg): 153/77 Body Mass Index(BMI): 52 Temperature(F): 98.0 Respiratory Rate 16 (breaths/min): Photos: [18:No Photos] [N/A:N/A] Wound Location: [18:Left, Lateral Lower Leg] [N/A:N/A] Wounding Event: [18:Trauma] [N/A:N/A] Primary Etiology: [18:Trauma, Other] [N/A:N/A] Comorbid History: [18:Anemia, Lymphedema, Chronic Obstructive Pulmonary Disease (COPD), Sleep Apnea, Congestive Heart Failure, Coronary Artery Disease, Hypertension, Peripheral Venous  Disease, Type II Diabetes, Osteoarthritis, Neuropathy, Received  Chemotherapy] [N/A:N/A] Date Acquired: [18:07/16/2018] [N/A:N/A] Weeks of Treatment: [18:3] [N/A:N/A] Wound Status: [18:Healed - Epithelialized] [N/A:N/A] Measurements L x W x D [18:0x0x0] [N/A:N/A] (cm) Area (cm) : [18:0] [N/A:N/A] Volume (cm) : [18:0] [N/A:N/A] % Reduction in Area: [18:100.00%] [N/A:N/A] % Reduction in Volume: [18:100.00%] [N/A:N/A] Classification: [18:Partial Thickness] [N/A:N/A] Exudate Amount: [18:Medium] [N/A:N/A] Exudate Type: [18:Serous] [N/A:N/A] Exudate Color: [18:amber] [N/A:N/A] Wound Margin: [18:Flat and Intact] [N/A:N/A] Granulation Amount: [18:Large (67-100%)] [N/A:N/A] Granulation Quality: [18:Pink] [N/A:N/A] Necrotic Amount: [18:Small (1-33%)] [N/A:N/A] Necrotic Tissue: [18:Eschar] [N/A:N/A] Exposed Structures: [18:Fat Layer (Subcutaneous Tissue) Exposed: Yes Fascia: No Tendon: No Muscle: No] [N/A:N/A] Joint: No Bone: No Epithelialization: Small (1-33%) N/A N/A Periwound Skin Texture: Excoriation: No N/A N/A Induration: No Callus: No Crepitus: No Rash: No Scarring: No Periwound Skin Moisture: Dry/Scaly: Yes N/A N/A Maceration: No Periwound Skin Color: Atrophie Blanche: No N/A N/A Cyanosis: No Ecchymosis:  No Erythema: No Hemosiderin Staining: No Mottled: No Pallor: No Rubor: No Temperature: No Abnormality N/A N/A Tenderness on Palpation: No N/A N/A Wound Preparation: Ulcer Cleansing: N/A N/A Rinsed/Irrigated with Saline, Other: soap and water Topical Anesthetic Applied: Other: lidocaine 4% Treatment Notes Electronic Signature(s) Signed: 08/06/2018 5:24:46 PM By: Montey Hora Entered By: Montey Hora on 08/06/2018 14:28:29 Adam Merritt ([id number]) -------------------------------------------------------------------------------- [place] Details Patient Name: Adam Merritt Date of Service: 08/06/2018 1:30 PM Medical Record Number: [id number] Patient Account Number: [id number] Date of Birth/Sex: Jul 18, 1945 (72 y.o. M) Treating RN: Montey Hora Primary Care Jeremyah Jelley: [name] Other Clinician: Referring Beverlee Wilmarth: [name] Treating Ersie Savino/Extender: Melburn Hake, [name]: 61 Active Inactive Abuse / Safety / Falls / Self Care Management Nursing Diagnoses: Potential for falls Goals: Patient will remain injury free related to falls Date Initiated: 04/09/2018 Target Resolution Date: 07/13/2018 Goal Status: Active Interventions: Assess fall risk on admission and as needed Notes: Orientation to the Wound Care Program Nursing Diagnoses: Knowledge deficit related to the wound healing center program Goals: Patient/caregiver will verbalize understanding of the [place] Program Date Initiated: 04/09/2018 Target Resolution Date: 07/13/2018 Goal Status: Active Interventions: Provide education on orientation to the wound center Notes: Venous Leg Ulcer Nursing Diagnoses: Actual venous Insuffiency (use after diagnosis is confirmed) Goals: Patient will maintain optimal edema control Date Initiated: 04/09/2018 Target Resolution Date: 07/13/2018 Goal Status: Active Interventions: Assess peripheral edema status every visit. Adam Merritt, Adam Merritt ([id number]) Compression as ordered Provide education on venous insufficiency Notes: Electronic Signature(s) Signed: 08/06/2018 5:24:46 PM By: Montey Hora Entered By: Montey Hora on 08/06/2018 14:28:20 Adam Merritt, Adam Merritt ([id number]) -------------------------------------------------------------------------------- Pain Assessment Details Patient Name: Adam Merritt Date of Service: 08/06/2018 1:30 PM Medical Record Number: [id number] Patient Account Number: [id number] Date of Birth/Sex: Aug 31, 1945 (72 y.o. M) Treating RN: Army Melia Primary Care Mikena Masoner: [name] Other Clinician: Referring Lavance Beazer: [name] Treating Biagio Snelson/Extender: Melburn Hake, [name]: 17 Active Problems Location of Pain Severity and Description of Pain Patient Has Paino No Site Locations Pain Management and Medication Current Pain Management: Electronic Signature(s) Signed: 08/06/2018 4:08:43 PM By: Army Melia Entered By: Army Melia on 08/06/2018 13:58:45 Adam Merritt ([id number]) -------------------------------------------------------------------------------- Patient/Caregiver Education Details Patient Name: Adam Merritt Date of Service: 08/06/2018 1:30 PM Medical Record Number: [id number] Patient Account Number: [id number] Date of Birth/Gender: [birth year] (72 y.o. M) Treating RN: Montey Hora Primary Care Physician: [name] Other Clinician: Referring Physician: [name] Treating Physician/Extender: Sharalyn Ink in Treatment: 44 Education Assessment Education Provided To: Patient and Caregiver  Education Topics Provided Venous: Handouts: Other: make appointment with lymph clinic Methods: Explain/Verbal, Printed Responses: State content correctly Electronic Signature(s) Signed: 08/06/2018 5:24:46 PM By: Montey Hora Entered By: Montey Hora on 08/06/2018 14:32:55 Adam Merritt  ([id number]) -------------------------------------------------------------------------------- Wound Assessment Details Patient Name: Adam Merritt Date of Service: 08/06/2018 1:30 PM Medical Record Number: [id number] Patient Account Number: [id number] Date of Birth/Sex: [birth year] (72 y.o. M) Treating RN: Montey Hora Primary Care Anorah Trias: [name] Other Clinician: Referring Alejandro Gamel: [name] Treating Davonne Jarnigan/Extender: STONE III, [name]: 17 Wound Status Wound Number: [id number] Primary Trauma, Other Etiology: Wound Location: Left, Lateral Lower Leg Wound Healed - Epithelialized Wounding Event: Trauma Status: Date Acquired: 07/16/2018 Comorbid Anemia, Lymphedema, Chronic Obstructive Weeks Of Treatment: 3 History: Pulmonary Disease (COPD), Sleep Apnea, Clustered Wound: No Congestive Heart Failure, Coronary Artery Disease, Hypertension, Peripheral Venous Disease, Type II Diabetes, Osteoarthritis, Neuropathy, Received Chemotherapy Photos Photo Uploaded By: Gretta Cool, BSN, RN, CWS, Kim on 08/06/2018 15:56:34 Wound Measurements Length: (cm) 0 % Reducti Width: (cm) 0 % Reducti Depth: (cm) 0 Epithelia Area: (cm) 0 Tunnelin Volume: (cm) 0 Undermin on in Area: 100% on in Volume: 100% lization: Small (1-33%) g: No ing: No Wound Description Classification: Partial Thickness Foul Odor Wound Margin: Flat and Intact Slough/Fi Exudate Amount: Medium Exudate Type: Serous Exudate Color: amber After Cleansing: No brino Yes Wound Bed Granulation Amount: Large (67-100%) Exposed Structure Granulation Quality: Pink Fascia Exposed: No Necrotic Amount: Small (1-33%) Fat Layer (Subcutaneous Tissue) Exposed: Yes Necrotic Quality: Eschar Tendon Exposed: No Muscle Exposed: No Joint Exposed: No CELESTER, LECH ([id number]) Bone Exposed: No Periwound Skin Texture Texture Color No Abnormalities Noted: No No Abnormalities Noted: No Callus: No Atrophie Blanche:  No Crepitus: No Cyanosis: No Excoriation: No Ecchymosis: No Induration: No Erythema: No Rash: No Hemosiderin Staining: No Scarring: No Mottled: No Pallor: No Moisture Rubor: No No Abnormalities Noted: No Dry / Scaly: Yes Temperature / Pain Maceration: No Temperature: No Abnormality Wound Preparation Ulcer Cleansing: Rinsed/Irrigated with Saline, Other: soap and water, Topical Anesthetic Applied: Other: lidocaine 4%, Electronic Signature(s) Signed: 08/06/2018 5:24:46 PM By: Montey Hora Entered By: Montey Hora on 08/06/2018 14:27:59 Adam Merritt ([id number]) -------------------------------------------------------------------------------- [place] Details Patient Name: Adam Merritt Date of Service: 08/06/2018 1:30 PM Medical Record Number: [id number] Patient Account Number: [id number] Date of Birth/Sex: [birth year] (72 y.o. M) Treating RN: Army Melia Primary Care Kyndel Egger: [name] Other Clinician: Referring Crit Obremski: [name] Treating Herminia Warren/Extender: Melburn Hake, [name]: 17 Vital Signs Time Taken: 13:58 Temperature (F): 98.0 Height (in): 66 Pulse (bpm): 84 Weight (lbs): 323 Respiratory Rate (breaths/min): 16 Body Mass Index (BMI): 52.1 Blood Pressure (mmHg): 153/77 Reference Range: 80 - 120 mg / dl Electronic Signature(s) Signed: 08/06/2018 4:08:43 PM By: Army Melia Entered By: Army Melia on 08/06/2018 13:59:02

## 2018-08-10 NOTE — Progress Notes (Signed)
Adam, Merritt ([id number]) Visit Report for 08/06/2018 Chief Complaint Document Details Patient Name: Adam Merritt, Adam Merritt Date of Service: 08/06/2018 1:30 PM Medical Record Number: [id number] Patient Account Number: [id number] Date of Birth/Sex: [birth year] (72 y.o. M) Treating RN: Montey Hora Primary Care Provider: [name] Other Clinician: Referring Provider: [name] Treating Provider/Extender: Melburn Hake, [name]: 17 Information Obtained from: Patient Chief Complaint he is here for evaluation of bilateral lower extremity lymphedema right right toe and left LE ulcers Electronic Signature(s) Signed: 08/07/2018 11:58:16 PM By: Worthy Keeler PA-C Entered By: Worthy Keeler on 08/06/2018 14:08:14 Adam Merritt ([id number]) -------------------------------------------------------------------------------- HPI Details Patient Name: Adam Merritt Date of Service: 08/06/2018 1:30 PM Medical Record Number: [id number] Patient Account Number: [id number] Date of Birth/Sex: 15-Sep-1945 (72 y.o. M) Treating RN: Montey Hora Primary Care Provider: [name] Other Clinician: Referring Provider: [name] Treating Provider/Extender: Melburn Hake, [name]: 17 History of Present Illness HPI Description: 10/18/17-He is here for initial evaluation of bilateral lower extremity ulcerations in the presence of venous insufficiency and lymphedema. He has been seen by vascular medicine in the past, Dr. Lucky Cowboy, last seen in 2016. He does have a history of abnormal ABIs, which is to be expected given his lymphedema and venous insufficiency. According to Epic, it appears that all attempts for arterial evaluation and/or angiography were not follow through with by patient. He does have a history of being seen in lymphedema clinic in 2018, stopped going approximately 6 months ago stating "it didn't do any good". He does not have lymphedema pumps, he does not have custom fit  compression wrap/stockings. He is diabetic and his recent A1c last month was 7.6. He admits to chronic bilateral lower extremity pain, no change in pain since blister and ulceration development. He is currently being treated with Levaquin for bronchitis. He has home health and we will continue. 10/25/17-He is here in follow-up evaluation for bilateral lower extremity ulcerationssubtle he remains on Levaquin for bronchitis. Right lower extremity with no evidence of drainage or ulceration, persistent left lower extremity ulceration. He states that home health has not been out since his appointment. He went to [facility] on Tuesday, studies revealed: RIGHT ABI 0.9, TBI 0.6 LEFT ABI 1.1, TBI 0.6 with triphasic flow bilaterally. We will continue his same treatment plan. He has been educated on compression therapy and need for elevation. He will benefit from lymphedema pumps 11/01/17-He is here in follow-up evaluation for left lower extremity ulcer. The right lower extremity remains healed. He has home health services, but they have not been out to see the patient for 2-3 weeks. He states it home health physical therapy changed his dressing yesterday after therapy; he placed Ace wrap compression. We are still waiting for lymphedema pumps, reordered d/t need for company change. 11/08/17-He is here in follow-up evaluation for left lower extremity ulcer. It is improved. Edema is significantly improved with compression therapy. We will continue with same treatment plan and he will follow-up next week. No word regarding lymphedema pumps 11/15/17-He is here in follow-up evaluation for left lower extremity ulcer. He is healed and will be discharged from wound care services. I have reached out to [facility] regarding his lymphedema pumps. They have been unable to reach the patient; the contact number they had with the patient's wife's cell phone and she has not answered any unrecognized  calls. Contact should be made today, trial planned for next week; [facility] will continue to  follow 11/27/17 on evaluation today patient has multiple blistered areas over the right lower extremity his left lower extremity appears to be doing okay. These blistered areas show signs of no infection which is great news. With that being said he did have some necrotic skin overlying which was mechanically debrided away with saline and gauze today without complication. Overall post debridement the wounds appear to be doing better but in general his swelling seems to be increased. This is obviously not good news. I think this is what has given rise to the blisters. 12/04/17 on evaluation today patient presents for follow-up concerning his bilateral lower extremity edema in the right lower extremity ulcers. He has been tolerating the dressing changes without complication. With that being said he has had no real issues with the wraps which is also good news. Overall I'm pleased with the progress he's been making. 12/11/17 on evaluation today patient appears to be doing rather well in regard to his right lateral lower extremity ulcer. He's been tolerating the dressing changes without complication. Fortunately there does not appear to be any evidence of infection at this time. Overall I'm pleased with the progress that is being made. Unfortunately he has been in the hospital due to having what sounds to be a stomach virus/flu fortunately that is starting to get better. 12/18/17 on evaluation today patient actually appears to be doing very well in regard to his bilateral lower extremities the swelling is under fairly good control his lymphedema pumps are still not up and running quite yet. With that being said he does have several areas of opening noted as far as wounds are concerned mainly over the left lower extremity. With that being said I do believe once he gets lymphedema pumps this would least help you  mention some the fluid and preventing this from occurring. Hopefully that will be set up soon sleeves are Artie in place at his home he just waiting for the machine. 12/25/17 on evaluation today patient actually appears to be doing excellent in fact all of his ulcers appear to have resolved his GRANTLEY, SAVAGE. ([id number]) legs appear very well. I do think he needs compression stockings we have discussed this and they are actually going to go to [place] today to elastic therapy to get this fitted for him. I think that is definitely a good thing to do. Readmission: 04/09/18 upon evaluation today this patient is seen for readmission due to bilateral lower extremity lymphedema. He has significant swelling of his extremities especially on the left although the right is also swollen he has weeping from both sides. There are no obvious open wounds at this point. Fortunately he has been doing fairly well for quite a bit of time since I last saw him. Nonetheless unfortunately this seems to have reopened and is giving quite a bit of trouble. He states this began about a week ago when he first called [place] to get in to be seen. No fevers, chills, nausea, or vomiting noted at this time. He has not been using his lymphedema pumps due to the fact that they won't fit on his leg at this point likewise is also not been using his compression for essentially the same reason. 04/16/18 upon evaluation today patient actually appears to be doing a little better in regard to the fluid in his bilateral lower extremities. With that being said he's had three falls since I saw him last week. He also states that he's been feeling very poorly. I was  concerned last week and feel like that the concern is still there as far as the congestion in his chest is concerned he seems to be breathing about the same as last week but again he states he's very weak he's not even able to walk further than from the chair to the door. His wife had  to buy a wheelchair just to be able to get them out of the house to get to the appointment today. This has me very concerned. 04/23/18 on evaluation today patient actually appears to be doing much better than last week's evaluation. At that time actually had to transport him to the ER via EMS and he subsequently was admitted for acute pulmonary edema, acute renal failure, and acute congestive heart failure. Fortunately he is doing much better. Apparently they did dialyze him and were able to take off roughly 35 pounds of fluid. Nonetheless he is feeling much better both in regard to his breathing and he's able to get around much better at this time compared to previous. Overall I'm very happy with how things are at this time. There does not appear to be any evidence of infection currently. No fevers, chills, nausea, or vomiting noted at this time. 04/30/2018 patient seen today for follow-up and management of bilateral lower extremity lymphedema. He did express being more sad today than usual due to the recent loss of his dog. He states that he has been compliant with using the lymphedema pumps. However he does admit a minute over the last 2-3 days he has not been using the pumps due to the recent loss of his dog. At this time there is no drainage or open wounds to his lower extremities. The left leg edema is measuring smaller today. Still has a significant amount of edema on bilateral lower extremities With dry flaky skin. He will be referred to the lymphedema clinic for further management. Will continue 3 layer compression wraps and follow-up in 1-2 weeks.Denies any pain, fever, chairs recently. No recent falls or injuries reported during this visit. 05/07/18 on evaluation today patient actually appears to be doing very well in regard to his lower extremities in general all things considered. With that being said he is having some pain in the legs just due to the amount of swelling. He does have an  area where he had a blister on the left lateral lower extremity this is open at this point other than that there's nothing else weeping at this time. 05/14/18 on evaluation today patient actually appears to be doing excellent all things considered in regard to his lower extremities. He still has a couple areas of weeping on each leg which has continued to be the issue for him. He does have an appointment with the lymphedema clinic although this isn't until February 2020. That was the earliest they had. In the meantime he has continued to tolerate the compression wraps without complication. 05/28/18 on evaluation today patient actually appears to be doing more poorly in regard to his left lower extremity where he has a wound open at this point. He also had a fall where he subsequently injured his right great toe which has led to an open wound at the site unfortunately. He has been tolerating the dressing changes without complication in general as far as the wraps are concerned that he has not been putting any dressing on the left 1st toe ulcer site. 06/11/18 on evaluation today patient appears to be doing much worse in regard to his  bilateral lower extremity ulcers. He has been tolerating the dressing changes without complication although his legs have not been wrapped more recently. Overall I am not very pleased with the way his legs appear. I do believe he needs to be back in compression wraps he still has not received his compression wraps from the [facility] hospital as of yet. 06/18/18 on evaluation today patient actually appears to be doing significantly better than last time I saw him. He has been tolerating the compression wraps without complication in the circumferential ulcers especially appear to be doing much better. His toe ulcer on the right in regard to the great toe is better although not as good as the legs in my pinion. No fevers chills noted KOHLTON, GILPATRICK ([id number]) 07/02/18 on evaluation  today patient appears to be doing much better in regard to his lower extremity ulcers. Unfortunately since I last saw him he's had the distal portion of his right great toe if he dated it sounds as if this actually went downhill very quickly. I had only seen him a few days prior and the toe did not appear to be infected at that point subsequently became infected very rapidly and it was decided by the surgeon that the distal portion of the toe needed to be removed. The patient seems to be doing well in this regard he tells me. With that being said his lower extremities are doing better from the standpoint of the wounds although he is significantly swollen at this point. 07/09/18 on evaluation today patient appears to be doing better in regard to the wounds on his lower extremities. In fact everything is almost completely healed he is just a small area on the left posterior lower extremity that is open at this point. 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 With that being said he needs some kind of compression it does sound like he may not be wearing his compression, that is the wraps, during the entire time between when he's here visit to visit. Apparently his wife took the current one off because it began to "fall apart". 07/16/18 on evaluation today patient appears to be extremely swollen especially in regard to his left lower extremity unfortunately. He also has a new skin tear over the left lower extremity and there's a smaller area on the right lower extremity as well. Unfortunately this seems to be due in part to blistering and fluid buildup in his leg. He did get the reduction wraps that were ordered by the [facility] hospital for him to go to lymphedema clinic. With that being said his wounds on the legs have not healed  to the point to where they would likely accept them as a patient lymphedema clinic currently. We need to try to get this to heal. With that being said he's been taking his wraps off which is not doing him any favors at this point. In fact this is probably quite counterproductive compared to what needs to occur. We will likely need to increase to a four layer compression wrap and continue to also utilize elevation and he has to keep the wraps on not take them off as he's been doing currently hasn't had a wrap on since Saturday. 07/23/18 on evaluation today patient appears to be doing much better in regard to his bilateral lower extremities.  In fact his left lower extremity which was the largest is actually 15 cm smaller today compared to what it was last time he was here in our clinic. This is obviously good news after just one week. Nonetheless the differences he actually kept the wraps on during the entire week this time. That's not typical for him. I do believe he understands a little bit better now the severity of the situation and why it's important for him to keep these wraps on. 07/30/18 on evaluation today patient actually appears to be doing rather well in regard to his lower extremities. His legs are much smaller than they have been in the past and he actually has only one very small rudder superficial region remaining that is not closed on the left lateral/posterior lower extremity even this is almost completely close. I do believe likely next week he will be healed without any complications. I do think we need to continue the wraps however this seems to be beneficial for him. I also think it may be a good time for [place] to go ahead and see about getting the appointment with the lymphedema clinic which is supposed to be made for him in order to keep this moving along and hopefully get them into compression wraps that will in the end help him to remain healed. 08/06/18 on evaluation today patient  actually appears to be doing very well in regard as bilateral lower extremities. In fact his wounds appear to be completely healed at this time. 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 He is in the process of getting appointment with the lymphedema clinic we have made this referral were just waiting to hear back on the schedule time. We need to follow up on that today as well. Electronic Signature(s) Signed: 08/07/2018 11:58:16 PM By: Worthy Keeler PA-C Entered By: Worthy Keeler on 08/07/2018 23:46:32 Adam Merritt ([id number]) -------------------------------------------------------------------------------- Physical Exam Details Patient Name: Adam Merritt Date of Service: 08/06/2018 1:30 PM Medical Record Number: [id number] Patient Account Number: [id number] Date of Birth/Sex: March 14, 1946 (72 y.o. M) Treating RN: Montey Hora Primary Care Provider: [name] Other Clinician: Referring Provider: [name] Treating Provider/Extender: STONE III, [name]: 56 Constitutional Well-nourished and well-hydrated in no acute distress. Respiratory normal breathing without difficulty. clear to auscultation bilaterally. Cardiovascular regular rate and rhythm with normal S1, S2. Psychiatric this patient is able to make decisions and demonstrates good insight into disease process. Alert and Oriented x 3. pleasant and cooperative. Notes Patient's wound bed currently shows evidence of good epithelialization with what appears to be complete what resolution at this point. There does not appear to be any evidence of infection which is great news. Electronic Signature(s) Signed: 08/07/2018 11:58:16 PM By: Worthy Keeler PA-C Entered By: Worthy Keeler on 08/07/2018 23:47:04 Adam Merritt ([id number]) -------------------------------------------------------------------------------- Physician Orders Details Patient  Name: Adam Merritt Date of Service: 08/06/2018 1:30 PM Medical Record Number: [id number] Patient Account Number: [id number] Date of Birth/Sex: 1945/06/22 (72 y.o. 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 o Nurse Visit as needed - Friday Edema Control o 4 Layer Compression System - Bilateral Electronic Signature(s) Signed: 08/06/2018 5:24:46 PM By: Montey Hora Signed: 08/07/2018 11:58:16 PM By: Worthy Keeler PA-C Entered By: Montey Hora on 08/06/2018 14:29:43 Adam Merritt ([id number]) -------------------------------------------------------------------------------- Problem List Details Patient Name: Adam Merritt Date of Service: 08/06/2018 1:30 PM Medical Record Number: [id number] Patient Account Number: [id number] Date of Birth/Sex: 08/31/1945 (73 y.o. M) Treating RN: Montey Hora Primary Care Provider: [name] Other Clinician: Referring Provider: [name] Treating Provider/Extender: Melburn Hake, [name]: 53 Active Problems ICD-10 Evaluated Encounter Code Description Active Date Today Diagnosis I89.0 Lymphedema, not elsewhere classified 04/09/2018 No Yes L97.512 Non-pressure chronic ulcer of other part of right foot with fat 05/28/2018  No Yes layer exposed L97.822 Non-pressure chronic ulcer of other part of left lower leg with 05/28/2018 No Yes fat layer exposed L98.492 Non-pressure chronic ulcer of skin of other sites with fat layer 06/11/2018 No Yes exposed E66.9 Obesity, unspecified 04/09/2018 No Yes Inactive Problems Resolved Problems ICD-10 Code Description Active Date Resolved Date L03.115 Cellulitis of right lower limb 04/09/2018 04/09/2018 L03.116 Cellulitis of left lower limb 04/09/2018 04/09/2018 Electronic Signature(s) Signed: 08/07/2018 11:58:16 PM By: Worthy Keeler PA-C Entered By: Worthy Keeler on 08/06/2018 14:08:09 Adam Merritt ([id number]) RUSTYN, CONERY ([id number]) -------------------------------------------------------------------------------- Progress Note Details Patient Name: Adam Merritt Date of Service: 08/06/2018 1:30 PM Medical Record Number: [id number] Patient Account Number: [id number] Date of Birth/Sex: 10-16-1945 (72 y.o. M) Treating RN: Montey Hora Primary Care Provider: [name] Other Clinician: Referring Provider: [name] Treating Provider/Extender: Melburn Hake, [name]: 17 Subjective Chief Complaint Information obtained from Patient he is here for evaluation of bilateral lower extremity lymphedema right right toe and left LE ulcers History of Present Illness (HPI) 10/18/17-He is here for initial evaluation of bilateral lower extremity ulcerations in the presence of venous insufficiency and lymphedema. He has been seen by vascular medicine in the past, Dr. Lucky Cowboy, last seen in 2016. He does have a history of abnormal ABIs, which is to be expected given his lymphedema and venous insufficiency. According to Epic, it appears that all attempts for arterial evaluation and/or angiography were not follow through with by patient. He does have a history of being seen in lymphedema clinic in 2018, stopped going approximately 6 months ago stating "it didn't do any good".  He does not have lymphedema pumps, he does not have custom fit compression wrap/stockings. He is diabetic and his recent A1c last month was 7.6. He admits to chronic bilateral lower extremity pain, no change in pain since blister and ulceration development. He is currently being treated with Levaquin for bronchitis. He has home health and we will continue. 10/25/17-He is here in follow-up evaluation for bilateral lower extremity ulcerationssubtle he remains on Levaquin for bronchitis. Right lower extremity with no evidence of drainage or ulceration, persistent left lower extremity ulceration. He states that home health has not been out since his appointment. He went to [facility] on Tuesday, studies revealed: RIGHT ABI 0.9, TBI 0.6 LEFT ABI 1.1, TBI 0.6 with triphasic flow bilaterally. We will continue his same treatment plan. He has been educated on compression therapy and need for elevation. He will benefit  from lymphedema pumps 11/01/17-He is here in follow-up evaluation for left lower extremity ulcer. The right lower extremity remains healed. He has home health services, but they have not been out to see the patient for 2-3 weeks. He states it home health physical therapy changed his dressing yesterday after therapy; he placed Ace wrap compression. We are still waiting for lymphedema pumps, reordered d/t need for company change. 11/08/17-He is here in follow-up evaluation for left lower extremity ulcer. It is improved. Edema is significantly improved with compression therapy. We will continue with same treatment plan and he will follow-up next week. No word regarding lymphedema pumps 11/15/17-He is here in follow-up evaluation for left lower extremity ulcer. He is healed and will be discharged from wound care services. I have reached out to [facility] regarding his lymphedema pumps. They have been unable to reach the patient; the contact number they had with the patient's  wife's cell phone and she has not answered any unrecognized calls. Contact should be made today, trial planned for next week; [facility] will continue to follow 11/27/17 on evaluation today patient has multiple blistered areas over the right lower extremity his left lower extremity appears to be doing okay. These blistered areas show signs of no infection which is great news. With that being said he did have some necrotic skin overlying which was mechanically debrided away with saline and gauze today without complication. Overall post debridement the wounds appear to be doing better but in general his swelling seems to be increased. This is obviously not good news. I think this is what has given rise to the blisters. 12/04/17 on evaluation today patient presents for follow-up concerning his bilateral lower extremity edema in the right lower extremity ulcers. He has been tolerating the dressing changes without complication. With that being said he has had no real issues with the wraps which is also good news. Overall I'm pleased with the progress he's been making. 12/11/17 on evaluation today patient appears to be doing rather well in regard to his right lateral lower extremity ulcer. He's been tolerating the dressing changes without complication. Fortunately there does not appear to be any evidence of infection at this time. Overall I'm pleased with the progress that is being made. Unfortunately he has been in the hospital due to having what sounds to be a stomach virus/flu fortunately that is starting to get better. 12/18/17 on evaluation today patient actually appears to be doing very well in regard to his bilateral lower extremities the JERMAL, DISMUKE. ([id number]) swelling is under fairly good control his lymphedema pumps are still not up and running quite yet. With that being said he does have several areas of opening noted as far as wounds are concerned mainly over the left lower extremity.  With that being said I do believe once he gets lymphedema pumps this would least help you mention some the fluid and preventing this from occurring. Hopefully that will be set up soon sleeves are Artie in place at his home he just waiting for the machine. 12/25/17 on evaluation today patient actually appears to be doing excellent in fact all of his ulcers appear to have resolved his legs appear very well. I do think he needs compression stockings we have discussed this and they are actually going to go to [facility] today to elastic therapy to get this fitted for him. I think that is definitely a good thing to do. Readmission: 04/09/18 upon evaluation today this patient is seen for  readmission due to bilateral lower extremity lymphedema. He has significant swelling of his extremities especially on the left although the right is also swollen he has weeping from both sides. There are no obvious open wounds at this point. Fortunately he has been doing fairly well for quite a bit of time since I last saw him. Nonetheless unfortunately this seems to have reopened and is giving quite a bit of trouble. He states this began about a week ago when he first called [place] to get in to be seen. No fevers, chills, nausea, or vomiting noted at this time. He has not been using his lymphedema pumps due to the fact that they won't fit on his leg at this point likewise is also not been using his compression for essentially the same reason. 04/16/18 upon evaluation today patient actually appears to be doing a little better in regard to the fluid in his bilateral lower extremities. With that being said he's had three falls since I saw him last week. He also states that he's been feeling very poorly. I was concerned last week and feel like that the concern is still there as far as the congestion in his chest is concerned he seems to be breathing about the same as last week but again he states he's very weak he's not even able to  walk further than from the chair to the door. His wife had to buy a wheelchair just to be able to get them out of the house to get to the appointment today. This has me very concerned. 04/23/18 on evaluation today patient actually appears to be doing much better than last week's evaluation. At that time actually had to transport him to the ER via EMS and he subsequently was admitted for acute pulmonary edema, acute renal failure, and acute congestive heart failure. Fortunately he is doing much better. Apparently they did dialyze him and were able to take off roughly 35 pounds of fluid. Nonetheless he is feeling much better both in regard to his breathing and he's able to get around much better at this time compared to previous. Overall I'm very happy with how things are at this time. There does not appear to be any evidence of infection currently. No fevers, chills, nausea, or vomiting noted at this time. 04/30/2018 patient seen today for follow-up and management of bilateral lower extremity lymphedema. He did express being more sad today than usual due to the recent loss of his dog. He states that he has been compliant with using the lymphedema pumps. However he does admit a minute over the last 2-3 days he has not been using the pumps due to the recent loss of his dog. At this time there is no drainage or open wounds to his lower extremities. The left leg edema is measuring smaller today. Still has a significant amount of edema on bilateral lower extremities With dry flaky skin. He will be referred to the lymphedema clinic for further management. Will continue 3 layer compression wraps and follow-up in 1-2 weeks.Denies any pain, fever, chairs recently. No recent falls or injuries reported during this visit. 05/07/18 on evaluation today patient actually appears to be doing very well in regard to his lower extremities in general all things considered. With that being said he is having some pain in  the legs just due to the amount of swelling. He does have an area where he had a blister on the left lateral lower extremity this is open at this point  other than that there's nothing else weeping at this time. 05/14/18 on evaluation today patient actually appears to be doing excellent all things considered in regard to his lower extremities. He still has a couple areas of weeping on each leg which has continued to be the issue for him. He does have an appointment with the lymphedema clinic although this isn't until February 2020. That was the earliest they had. In the meantime he has continued to tolerate the compression wraps without complication. 05/28/18 on evaluation today patient actually appears to be doing more poorly in regard to his left lower extremity where he has a wound open at this point. He also had a fall where he subsequently injured his right great toe which has led to an open wound at the site unfortunately. He has been tolerating the dressing changes without complication in general as far as the wraps are concerned that he has not been putting any dressing on the left 1st toe ulcer site. 06/11/18 on evaluation today patient appears to be doing much worse in regard to his bilateral lower extremity ulcers. He has been tolerating the dressing changes without complication although his legs have not been wrapped more recently. Overall I am not very pleased with the way his legs appear. I do believe he needs to be back in compression wraps he still has not received his compression wraps from the [facility] hospital as of yet. CAIDIN, HEIDENREICH ([id number]) 06/18/18 on evaluation today patient actually appears to be doing significantly better than last time I saw him. He has been tolerating the compression wraps without complication in the circumferential ulcers especially appear to be doing much better. His toe ulcer on the right in regard to the great toe is better although not as good as the  legs in my pinion. No fevers chills noted 07/02/18 on evaluation today patient appears to be doing much better in regard to his lower extremity ulcers. Unfortunately since I last saw him he's had the distal portion of his right great toe if he dated it sounds as if this actually went downhill very quickly. I had only seen him a few days prior and the toe did not appear to be infected at that point subsequently became infected very rapidly and it was decided by the surgeon that the distal portion of the toe needed to be removed. The patient seems to be doing well in this regard he tells me. With that being said his lower extremities are doing better from the standpoint of the wounds although he is significantly swollen at this point. 07/09/18 on evaluation today patient appears to be doing better in regard to the wounds on his lower extremities. In fact everything is almost completely healed he is just a small area on the left posterior lower extremity that is open at this point. 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 With that being said he needs some kind of compression it does sound like he may not be wearing his compression, that is the wraps, during the entire time between when he's here visit to visit. Apparently his wife took the current one off because it began to "fall apart". 07/16/18 on evaluation today patient appears to be extremely swollen especially in regard to his left lower extremity unfortunately. He also has a new skin tear  over the left lower extremity and there's a smaller area on the right lower extremity as well. Unfortunately this seems to be due in part to blistering and fluid buildup in his leg. He did get the reduction wraps that were ordered by the [facility] hospital for him to go to lymphedema  clinic. With that being said his wounds on the legs have not healed to the point to where they would likely accept them as a patient lymphedema clinic currently. We need to try to get this to heal. With that being said he's been taking his wraps off which is not doing him any favors at this point. In fact this is probably quite counterproductive compared to what needs to occur. We will likely need to increase to a four layer compression wrap and continue to also utilize elevation and he has to keep the wraps on not take them off as he's been doing currently hasn't had a wrap on since Saturday. 07/23/18 on evaluation today patient appears to be doing much better in regard to his bilateral lower extremities. In fact his left lower extremity which was the largest is actually 15 cm smaller today compared to what it was last time he was here in our clinic. This is obviously good news after just one week. Nonetheless the differences he actually kept the wraps on during the entire week this time. That's not typical for him. I do believe he understands a little bit better now the severity of the situation and why it's important for him to keep these wraps on. 07/30/18 on evaluation today patient actually appears to be doing rather well in regard to his lower extremities. His legs are much smaller than they have been in the past and he actually has only one very small rudder superficial region remaining that is not closed on the left lateral/posterior lower extremity even this is almost completely close. I do believe likely next week he will be healed without any complications. I do think we need to continue the wraps however this seems to be beneficial for him. I also think it may be a good time for [place] to go ahead and see about getting the appointment with the lymphedema clinic which is supposed to be made for him in order to keep this moving along and hopefully get them into compression wraps that will in  the end help him to remain healed. 08/06/18 on evaluation today patient actually appears to be doing very well in regard as bilateral lower extremities. In fact his wounds appear to be completely healed at this time. 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 He is in the process of getting appointment with the lymphedema clinic we have made this referral were just waiting to hear back on the schedule time. We need to follow up on that today as well. Patient History Information obtained from Patient. Family History Cancer - Paternal Grandparents, Kidney Disease - Siblings, Lung Disease - Mother, No family history of Diabetes, Heart Disease, Hereditary Spherocytosis, Hypertension, Seizures, Stroke, Thyroid Problems, Tuberculosis. Social History Never smoker, Marital Status - Married, Alcohol Use - Never, Drug Use - No History, Caffeine Use - Never. RAIN, FRIEDT ([id number]) Medical History Eyes Denies history of Cataracts, Glaucoma, Optic Neuritis Ear/Nose/Mouth/Throat Denies history of Chronic sinus problems/congestion, Middle ear problems Hematologic/Lymphatic Patient has history of Anemia - aplastic anemia, Lymphedema Denies history of Hemophilia, Human Immunodeficiency Virus, Sickle Cell Disease Respiratory Patient has history of  Chronic Obstructive Pulmonary Disease (COPD), Sleep Apnea Denies history of Aspiration, Asthma, Pneumothorax, Tuberculosis Cardiovascular Patient has history of Congestive Heart Failure, Coronary Artery Disease, Hypertension, Peripheral Venous Disease Denies history of Angina, Arrhythmia, Deep Vein Thrombosis, Hypotension, Myocardial Infarction, Peripheral Arterial Disease, Phlebitis, Vasculitis Gastrointestinal Denies history of Cirrhosis , Colitis, Crohn s, Hepatitis A, Hepatitis B, Hepatitis C Endocrine Patient has history of Type II Diabetes Denies history of Type I  Diabetes Genitourinary Denies history of End Stage Renal Disease Immunological Denies history of Lupus Erythematosus, Raynaud s, Scleroderma Integumentary (Skin) Denies history of History of Burn, History of pressure wounds Musculoskeletal Patient has history of Osteoarthritis Denies history of Gout, Rheumatoid Arthritis, Osteomyelitis Neurologic Patient has history of Neuropathy Denies history of Dementia, Quadriplegia, Paraplegia, Seizure Disorder Oncologic Patient has history of Received Chemotherapy - last dose 2006 Denies history of Received Radiation Psychiatric Denies history of Anorexia/bulimia, Confinement Anxiety Hospitalization/Surgery History - 11/03/2013, [facility], bronchitis. Medical And Surgical History Notes Respiratory home O2 at night as needed Cardiovascular varicose veins Neurologic CVA - 1992 Review of Systems (ROS) Constitutional Symptoms (General Health) Denies complaints or symptoms of Fever, Chills. Respiratory The patient has no complaints or symptoms. Cardiovascular The patient has no complaints or symptoms. Psychiatric The patient has no complaints or symptoms. TAYARI, YANKEE ([id number]) Objective Constitutional Well-nourished and well-hydrated in no acute distress. Vitals Time Taken: 1:58 PM, Height: 66 in, Weight: 323 lbs, BMI: 52.1, Temperature: 98.0 F, Pulse: 84 bpm, Respiratory Rate: 16 breaths/min, Blood Pressure: 153/77 mmHg. Respiratory normal breathing without difficulty. clear to auscultation bilaterally. Cardiovascular regular rate and rhythm with normal S1, S2. Psychiatric this patient is able to make decisions and demonstrates good insight into disease process. Alert and Oriented x 3. pleasant and cooperative. General Notes: Patient's wound bed currently shows evidence of good epithelialization with what appears to be complete what resolution at this point. There does not appear to be any evidence of infection which is great  news. Integumentary (Hair, Skin) Wound #18 status is Healed - Epithelialized. Original cause of wound was Trauma. The wound is located on the Left,Lateral Lower Leg. The wound measures 0cm length x 0cm width x 0cm depth; 0cm^2 area and 0cm^3 volume. There is Fat Layer (Subcutaneous Tissue) Exposed exposed. There is no tunneling or undermining noted. There is a medium amount of serous drainage noted. The wound margin is flat and intact. There is large (67-100%) pink granulation within the wound bed. There is a small (1-33%) amount of necrotic tissue within the wound bed including Eschar. The periwound skin appearance exhibited: Dry/Scaly. The periwound skin appearance did not exhibit: Callus, Crepitus, Excoriation, Induration, Rash, Scarring, Maceration, Atrophie Blanche, Cyanosis, Ecchymosis, Hemosiderin Staining, Mottled, Pallor, Rubor, Erythema. Periwound temperature was noted as No Abnormality. 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 ([id number]) Procedures There was a Four Layer Compression Therapy Procedure with a pre-treatment ABI of 1.1 by Montey Hora, RN. Post procedure Diagnosis Wound #: Same as Pre-Procedure Plan Wound Cleansing: Cleanse wound with mild soap and water Dressing Change Frequency: Other: - Tuesdays and Fridays Follow-up Appointments: Return Appointment in 1 week. Nurse Visit as needed - Friday Edema Control: 4 Layer Compression System - Bilateral I'm gonna recommend that we continue with the above wound care  measures for the next week. Again he has no open wounds at this point but he does have evidence of continued lymphedema which has been extremely well  controlled with the compression wraps currently. We are working on getting him into the lymphedema clinic I do not want things to go backwards while we work towards that goal. For that reason I did not discontinue wound care at this point we will continue with the three layer compression wraps for him and see him back in one week in the meantime we'll see if we can expedite the lymphedema clinic referral. Please see above for specific wound care orders. We will see patient for re-evaluation in 1 week(s) here in the clinic. If anything worsens or changes patient will contact our office for additional recommendations. Electronic Signature(s) Signed: 08/07/2018 11:58:16 PM By: Worthy Keeler PA-C Entered By: Worthy Keeler on 08/07/2018 23:47:22 Adam Merritt ([id number]) -------------------------------------------------------------------------------- ROS/PFSH Details Patient Name: Adam Merritt Date of Service: 08/06/2018 1:30 PM Medical Record Number: [id number] Patient Account Number: [id number] Date of Birth/Sex: [birth year] (72 y.o. M) Treating RN: Montey Hora Primary Care Provider: [name] Other Clinician: Referring Provider: [name] Treating Provider/Extender: Melburn Hake, [name]: 17 Information Obtained From Patient Wound History Do you currently have one or more open woundso No Have you tested positive for osteomyelitis (bone infection)o No Have you had any tests for circulation on your legso Yes Constitutional Symptoms (General Health) Complaints and Symptoms: Negative for: Fever; Chills Eyes Medical History: Negative for: Cataracts; Glaucoma; Optic Neuritis Ear/Nose/Mouth/Throat Medical History: Negative for: Chronic sinus problems/congestion; Middle ear problems Hematologic/Lymphatic Medical History: Positive for: Anemia - aplastic anemia; Lymphedema Negative for: Hemophilia; Human Immunodeficiency Virus; Sickle Cell  Disease Respiratory Complaints and Symptoms: No Complaints or Symptoms Medical History: Positive for: Chronic Obstructive Pulmonary Disease (COPD); Sleep Apnea Negative for: Aspiration; Asthma; Pneumothorax; Tuberculosis Past Medical History Notes: home O2 at night as needed Cardiovascular Complaints and Symptoms: No Complaints or Symptoms Medical History: Positive for: Congestive Heart Failure; Coronary Artery Disease; Hypertension; Peripheral Venous Disease Negative for: Angina; Arrhythmia; Deep Vein Thrombosis; Hypotension; Myocardial Infarction; Peripheral Arterial Disease; Phlebitis; Vasculitis Past Medical History Notes: JAVIEN, TESCH ([id number]) varicose veins Gastrointestinal Medical History: Negative for: Cirrhosis ; Colitis; Crohnos; Hepatitis A; Hepatitis B; Hepatitis C Endocrine Medical History: Positive for: Type II Diabetes Negative for: Type I Diabetes Time with diabetes: since 2006 Treated with: Insulin Blood sugar tested every day: Yes Tested : Blood sugar testing results: Breakfast: 209 Genitourinary Medical History: Negative for: End Stage Renal Disease Immunological Medical History: Negative for: Lupus Erythematosus; Raynaudos; Scleroderma Integumentary (Skin) Medical History: Negative for: History of Burn; History of pressure wounds Musculoskeletal Medical History: Positive for: Osteoarthritis Negative for: Gout; Rheumatoid Arthritis; Osteomyelitis Neurologic Medical History: Positive for: Neuropathy Negative for: Dementia; Quadriplegia; Paraplegia; Seizure Disorder Past Medical History Notes: CVA - 1992 Oncologic Medical History: Positive for: Received Chemotherapy - last dose 2006 Negative for: Received Radiation Psychiatric Complaints and Symptoms: No Complaints or Symptoms Medical History: MYRL, LAZARUS ([id number]) Negative for: Anorexia/bulimia; Confinement Anxiety Immunizations Pneumococcal Vaccine: Received Pneumococcal  Vaccination: Yes Implantable Devices No devices added Hospitalization / Surgery History Name of Hospital Purpose of Hospitalization/Surgery Date [facility] bronchitis 11/03/2013 Family and Social History Cancer: Yes - Paternal Grandparents; Diabetes: No; Heart Disease: No; Hereditary Spherocytosis: No; Hypertension: No; Kidney Disease: Yes - Siblings; Lung Disease: Yes - Mother; Seizures: No; Stroke: No; Thyroid Problems: No; Tuberculosis: No; Never smoker; Marital Status - Married; Alcohol Use:  Never; Drug Use: No History; Caffeine Use: Never; Financial Concerns: No; Food, Clothing or Shelter Needs: No; Support System Lacking: No; Transportation Concerns: No; Advanced Directives: No; Patient does not want information on Advanced Directives Physician Affirmation I have reviewed and agree with the above information. Electronic Signature(s) Signed: 08/07/2018 11:58:16 PM By: Worthy Keeler PA-C Signed: 08/08/2018 4:54:37 PM By: Montey Hora Entered By: Worthy Keeler on 08/07/2018 23:46:46 Adam Merritt ([id number]) -------------------------------------------------------------------------------- SuperBill Details Patient Name: Adam Merritt Date of Service: 08/06/2018 Medical Record Number: [id number] Patient Account Number: [id number] Date of Birth/Sex: 1945-09-20 (72 y.o. M) Treating RN: Montey Hora Primary Care Provider: [name] Other Clinician: Referring Provider: [name] Treating Provider/Extender: Melburn Hake, [name]: 17 Diagnosis Coding ICD-10 Codes Code Description I89.0 Lymphedema, not elsewhere classified L97.512 Non-pressure chronic ulcer of other part of right foot with fat layer exposed L97.822 Non-pressure chronic ulcer of other part of left lower leg with fat layer exposed L98.492 Non-pressure chronic ulcer of skin of other sites with fat layer exposed E66.9 Obesity, unspecified Facility Procedures CPT4: Description Modifier Quantity Code [id number]  [id number] BILATERAL: Application of multi-layer venous compression system; leg (below 1 knee), including ankle and foot. Physician Procedures CPT4 Code Description: [id number] - WC PHYS LEVEL 4 - EST PT ICD-10 Diagnosis Description I89.0 Lymphedema, not elsewhere classified L97.512 Non-pressure chronic ulcer of other part of right foot with fat L97.822 Non-pressure chronic ulcer of other  part of left lower leg with L98.492 Non-pressure chronic ulcer of skin of other sites with fat layer Modifier: layer exposed fat layer expos exposed Quantity: 1 ed Electronic Signature(s) Signed: 08/07/2018 11:58:16 PM By: Worthy Keeler PA-C Previous Signature: 08/06/2018 5:24:46 PM Version By: Montey Hora Entered By: Worthy Keeler on 08/07/2018 23:47:37

## 2018-08-11 NOTE — Progress Notes (Signed)
AHAAN, ZOBRIST ([id number]) Visit Report for 08/09/2018 Arrival Information Details Patient Name: Adam Merritt, Adam Merritt Date of Service: 08/09/2018 12:45 PM Medical Record Number: [id number] Patient Account Number: [id number] Date of Birth/Sex: November 16, 1945 (72 y.o. M) Treating RN: Army Melia Primary Care Cyrstal Leitz: [name] Other Clinician: Referring Caedin Mogan: [name] Treating Amauri Keefe/Extender: Melburn Hake, [name]: 69 Visit Information History Since Last Visit Added or deleted any medications: No Patient Arrived: Walker Any new allergies or adverse reactions: No Arrival Time: 12:53 Had a fall or experienced change in No Accompanied By: spouse activities of daily living that may affect Transfer Assistance: None risk of falls: Patient Has Alerts: Yes Signs or symptoms of abuse/neglect since last visito No Patient Alerts: Patient on Blood Thinner Hospitalized since last visit: No Aspirin 325mg  Pain Present Now: No Type II Diabetes ABI 5/19 L 1.1 R 0.9 TBI L 0.6 R 0.6 Electronic Signature(s) Signed: 08/09/2018 3:17:07 PM By: Army Melia Entered By: Army Melia on 08/09/2018 12:53:53 Adam Merritt ([id number]) -------------------------------------------------------------------------------- Compression Therapy Details Patient Name: Adam Merritt Date of Service: 08/09/2018 12:45 PM Medical Record Number: [id number] Patient Account Number: [id number] Date of Birth/Sex: [birth year] (73 y.o. M) Treating RN: Army Melia Primary Care Khyren Hing: [name] Other Clinician: Referring Tyffani Foglesong: [name] Treating Caelan Branden/Extender: STONE III, [name]: 17 Compression Therapy Performed for Wound Assessment: NonWound Condition Lymphedema - Bilateral Leg Performed By: Clinician Army Melia, RN Compression Type: Four Layer Pre Treatment ABI: 1.1 Electronic Signature(s) Signed: 08/09/2018 3:17:07 PM By: Army Melia Entered By: Army Melia on 08/09/2018  13:00:49 Adam Merritt ([id number]) -------------------------------------------------------------------------------- Encounter Discharge Information Details Patient Name: Adam Merritt Date of Service: 08/09/2018 12:45 PM Medical Record Number: [id number] Patient Account Number: [id number] Date of Birth/Sex: 05/27/1946 (73 y.o. M) Treating RN: Army Melia Primary Care Gaetano Romberger: [name] Other Clinician: Referring Lucian Baswell: [name] Treating Shantika Bermea/Extender: Melburn Hake, [name]: 62 Encounter Discharge Information Items Discharge Condition: Stable Ambulatory Status: Walker Discharge Destination: Home Transportation: Private Auto Accompanied By: wife Schedule Follow-up Appointment: Yes Clinical Summary of Care: Electronic Signature(s) Signed: 08/09/2018 3:17:07 PM By: Army Melia Entered By: Army Melia on 08/09/2018 13:01:50 Adam Merritt ([id number]) -------------------------------------------------------------------------------- Patient/Caregiver Education Details Patient Name: Adam Merritt Date of Service: 08/09/2018 12:45 PM Medical Record Number: [id number] Patient Account Number: [id number] Date of Birth/Gender: 10-31-1945 (72 y.o. M) Treating RN: Army Melia Primary Care Physician: [name] Other Clinician: Referring Physician: [name] Treating Physician/Extender: Sharalyn Ink in Treatment: 42 Education Assessment Education Provided To: Patient Education Topics Provided Wound/Skin Impairment: Handouts: Caring for Your Ulcer Methods: Demonstration, Explain/Verbal Responses: State content correctly Electronic Signature(s) Signed: 08/09/2018 3:17:07 PM By: Army Melia Entered By: Army Melia on 08/09/2018 13:01:37

## 2018-08-12 ENCOUNTER — Encounter: Payer: Medicare Other | Admitting: Occupational Therapy

## 2018-08-13 ENCOUNTER — Ambulatory Visit: Payer: Medicare Other | Attending: Family | Admitting: Family

## 2018-08-13 ENCOUNTER — Encounter: Payer: Self-pay | Admitting: Family

## 2018-08-13 ENCOUNTER — Encounter: Payer: Self-pay | Admitting: Pharmacist

## 2018-08-13 ENCOUNTER — Encounter: Payer: Medicare Other | Admitting: Physician Assistant

## 2018-08-13 VITALS — BP 133/77 | HR 88 | Resp 18 | Ht 66.0 in | Wt 287.0 lb

## 2018-08-13 DIAGNOSIS — Z951 Presence of aortocoronary bypass graft: Secondary | ICD-10-CM | POA: Diagnosis not present

## 2018-08-13 DIAGNOSIS — Z8673 Personal history of transient ischemic attack (TIA), and cerebral infarction without residual deficits: Secondary | ICD-10-CM | POA: Insufficient documentation

## 2018-08-13 DIAGNOSIS — Z79899 Other long term (current) drug therapy: Secondary | ICD-10-CM | POA: Insufficient documentation

## 2018-08-13 DIAGNOSIS — I110 Hypertensive heart disease with heart failure: Secondary | ICD-10-CM | POA: Insufficient documentation

## 2018-08-13 DIAGNOSIS — E1140 Type 2 diabetes mellitus with diabetic neuropathy, unspecified: Secondary | ICD-10-CM | POA: Insufficient documentation

## 2018-08-13 DIAGNOSIS — E119 Type 2 diabetes mellitus without complications: Secondary | ICD-10-CM

## 2018-08-13 DIAGNOSIS — I2510 Atherosclerotic heart disease of native coronary artery without angina pectoris: Secondary | ICD-10-CM | POA: Diagnosis not present

## 2018-08-13 DIAGNOSIS — G4700 Insomnia, unspecified: Secondary | ICD-10-CM | POA: Insufficient documentation

## 2018-08-13 DIAGNOSIS — J449 Chronic obstructive pulmonary disease, unspecified: Secondary | ICD-10-CM | POA: Diagnosis not present

## 2018-08-13 DIAGNOSIS — I5032 Chronic diastolic (congestive) heart failure: Secondary | ICD-10-CM | POA: Diagnosis present

## 2018-08-13 DIAGNOSIS — I10 Essential (primary) hypertension: Secondary | ICD-10-CM

## 2018-08-13 DIAGNOSIS — E785 Hyperlipidemia, unspecified: Secondary | ICD-10-CM | POA: Insufficient documentation

## 2018-08-13 DIAGNOSIS — Z7982 Long term (current) use of aspirin: Secondary | ICD-10-CM | POA: Insufficient documentation

## 2018-08-13 DIAGNOSIS — Z8249 Family history of ischemic heart disease and other diseases of the circulatory system: Secondary | ICD-10-CM | POA: Diagnosis not present

## 2018-08-13 DIAGNOSIS — K219 Gastro-esophageal reflux disease without esophagitis: Secondary | ICD-10-CM | POA: Insufficient documentation

## 2018-08-13 DIAGNOSIS — I890 Lymphedema, not elsewhere classified: Secondary | ICD-10-CM

## 2018-08-13 DIAGNOSIS — Z794 Long term (current) use of insulin: Secondary | ICD-10-CM | POA: Insufficient documentation

## 2018-08-13 DIAGNOSIS — IMO0001 Reserved for inherently not codable concepts without codable children: Secondary | ICD-10-CM

## 2018-08-13 DIAGNOSIS — Z833 Family history of diabetes mellitus: Secondary | ICD-10-CM | POA: Insufficient documentation

## 2018-08-13 DIAGNOSIS — E11621 Type 2 diabetes mellitus with foot ulcer: Secondary | ICD-10-CM | POA: Diagnosis not present

## 2018-08-13 NOTE — Progress Notes (Signed)
[facility] - PHARMACIST COUNSELING NOTE  ADHERENCE ASSESSMENT  Adherence strategy: pill box. Wife uses weekly pill box and puts morning, afternoon and evening pill in Sun, Mon and Tues slot. Leaves Wednesday empty. Then puts morning, afternoon and evening pill in Thurs, Fri, Sat slot. Refills every two days.   Do you ever forget to take your medication? [] Yes (1) [x] No (0)  Do you ever skip doses due to side effects? [] Yes (1) [x] No (0)  Do you have trouble affording your medicines? [] Yes (1) [x] No (0)  Are you ever unable to pick up your medication due to transportation difficulties? [] Yes (1) [x] No (0)  Do you ever stop taking your medications because you don't believe they are helping? [] Yes (1) [x] No (0)  Total score _0______    Recommendations given to patient about increasing adherence: Encouraged wife to consider a weekly pill box with different time slots for each day (e.g. morning, afternoon, evening, bedtime) to help with adherence and to make it easier for her.  Guideline-Directed Medical Therapy/Evidence Based Medicine    ACE/ARB/ARNI: none (HFpEF)   Beta Blocker: carvedilol 25 mg twice daily   Aldosterone Antagonist: none Diuretic: furosemide 40 mg daily    SUBJECTIVE   HPI: Here for follow up visit. Patient says "today is a bad day."  Past Medical History:  Diagnosis Date  . Anemia   . CAD (coronary artery disease)   . Chest pain   . Coronary artery disease   . Diabetes mellitus without complication ([place])   . Diastolic CHF ([place])   . Esophageal reflux   . Herpes zoster without mention of complication   . Hyperlipidemia   . Hypertension   . Insomnia   . Lumbago   . Lymphedema   . Neuropathy in diabetes ([place])   . 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 Other malaise and fatigue   . Stroke ([place])   . Varicose veins         OBJECTIVE    Vital signs: HR 88, BP 133/77, weight (pounds)  287  ECHO: Date 09/16/16, EF 65-70%   BMP Latest Ref Rng & Units 06/25/2018 06/24/2018 06/23/2018  Glucose 70 - 99 mg/dL 130(H) 113(H) 126(H)  BUN 8 - 23 mg/dL 30(H) 28(H) 30(H)  Creatinine 0.61 - 1.24 mg/dL 1.73(H) 1.68(H) 1.94(H)  BUN/Creat Ratio 10 - 24 - - -  Sodium 135 - 145 mmol/L 138 139 136  Potassium 3.5 - 5.1 mmol/L 3.4(L) 3.2(L) 3.1(L)  Chloride 98 - 111 mmol/L 102 103 100  CO2 22 - 32 mmol/L 30 30 26   Calcium 8.9 - 10.3 mg/dL 8.3(L) 8.2(L) 8.2(L)    ASSESSMENT 73 year old male with HFpEF. He says "today is a bad day." Reports that some days are good and others are bad. Wife manages his medications. Denies concerns about current regimen. He does not weigh daily. He recently saw his doctor because insurance does not want to pay for zolpidem and doctor does not want to prescribe anymore. He is going to switch to trazodone.   PLAN Continue current regimen. BP slightly elevated today, but patient did not take any medications this morning as he was rushing to get to appointment. Patient encouraged to weigh daily. We also discussed at length the new medication (trazodone) that will be replacing zolpidem for sleep. We discussed that it will be safer for use with his current use of opioids.    Time spent: 10 minutes  Nico Syme  K Omarion Minnehan, Pharm.D. 08/13/2018 12:07 PM    Current Outpatient Medications:  .  albuterol (PROVENTIL HFA;VENTOLIN HFA) 108 (90 Base) MCG/ACT inhaler, INHALE 1 PUFF INTO THE LUNGS EVERY 6 HOURS AS NEEDED FOR WHEEZING OR SHORTNESS OF BREATH (Patient taking differently: Inhale 1 puff into the lungs every 6 (six) hours as needed for wheezing or shortness of breath. ), Disp: 8.5 g, Rfl: 0 .  aspirin 325 MG EC tablet, Take 325 mg by mouth daily., Disp: , Rfl:  .  atorvastatin (LIPITOR) 40 MG tablet, Take 40 mg by mouth at bedtime. , Disp: , Rfl:  .  carvedilol (COREG) 25 MG tablet, Take 25 mg by mouth 2 (two) times daily with a meal., Disp: , Rfl:  .  docusate sodium  (COLACE) 50 MG capsule, Take 50 mg by mouth 2 (two) times daily., Disp: , Rfl:  .  ENSURE MAX PROTEIN (ENSURE MAX PROTEIN) LIQD, Take 330 mLs (11 oz total) by mouth 2 (two) times daily. (Patient not taking: Reported on 08/13/2018), Disp: 660 mL, Rfl: 0 .  febuxostat (ULORIC) 40 MG tablet, Take 1 tablet (40 mg total) by mouth daily., Disp: 90 tablet, Rfl: 3 .  fentaNYL (DURAGESIC) 50 MCG/HR, Place 1 patch onto the skin every 3 (three) days., Disp: 10 patch, Rfl: 0 .  furosemide (LASIX) 40 MG tablet, Take 1 tablet (40 mg total) by mouth daily., Disp: 30 tablet, Rfl: 3 .  gabapentin (NEURONTIN) 300 MG capsule, Take 1 capsule po in AM and 1 capsule po in pm. Take 2 capsules po at bedtime. (Patient taking differently: Take 2 cap in the morning, 1 cap in the evening and 1 cap at night.), Disp: 180 capsule, Rfl: 3 .  HYDROcodone-acetaminophen (NORCO/VICODIN) 5-325 MG tablet, Take 1 tablet by mouth every 6 (six) hours as needed for moderate pain or severe pain. (Patient not taking: Reported on 08/13/2018), Disp: 20 tablet, Rfl: 0 .  indapamide (LOZOL) 2.5 MG tablet, Take 1 tablet (2.5 mg total) by mouth daily., Disp: 90 tablet, Rfl: 3 .  Insulin Glargine (BASAGLAR KWIKPEN) 100 UNIT/ML SOPN, INJECT 30 TO 36 UNITS UNDER THE SKIN EVERY DAY (Patient taking differently: Inject 30 Units into the skin daily. ), Disp: 3 pen, Rfl: 0 .  ipratropium-albuterol (DUONEB) 0.5-2.5 (3) MG/3ML SOLN, Take 3 mLs by nebulization every 4 (four) hours as needed (for shortness of breath)., Disp: 360 mL, Rfl: 3 .  liraglutide (VICTOZA) 18 MG/3ML SOPN, Inject 0.3 mLs (1.8 mg total) into the skin daily., Disp: 27 mL, Rfl: 3 .  Multiple Vitamin (MULTIVITAMIN WITH MINERALS) TABS tablet, Take 1 tablet by mouth daily., Disp: , Rfl:  .  omeprazole (PRILOSEC) 20 MG capsule, Take 20 mg by mouth daily., Disp: , Rfl:  .  ondansetron (ZOFRAN) 4 MG tablet, Take 1 tablet (4 mg total) by mouth every 8 (eight) hours as needed for nausea or vomiting.  (Patient not taking: Reported on 08/13/2018), Disp: 20 tablet, Rfl: 0 .  pentoxifylline (TRENTAL) 400 MG CR tablet, TAKE 1 TABLET BY MOUTH THREE TIMES DAILY FOR CLAUDICATION, Disp: 270 tablet, Rfl: 3 .  potassium chloride SA (K-DUR,KLOR-CON) 20 MEQ tablet, Take 2 tablets (40 mEq total) by mouth 2 (two) times daily. (Patient taking differently: Take 20 mEq by mouth 3 (three) times daily. ), Disp: 120 tablet, Rfl: 3 .  sennosides-docusate sodium (SENOKOT-S) 8.6-50 MG tablet, Take 1-2 tablets by mouth 2 (two) times daily., Disp: , Rfl:  .  zolpidem (AMBIEN) 10 MG tablet, Take 1 tablet (  10 mg total) by mouth at bedtime as needed for sleep., Disp: 30 tablet, Rfl: 3   COUNSELING POINTS/CLINICAL PEARLS  Carvedilol (Goal: weight less than 85 kg is 25 mg BID, weight greater than 85 kg is 50 mg BID)  Patient should avoid activities requiring coordination until drug effects are realized, as drug may cause dizziness.  This drug may cause diarrhea, nausea, vomiting, arthralgia, back pain, myalgia, headache, vision disorder, erectile dysfunction, reduced libido, or fatigue.  Instruct patient to report signs/symptoms of adverse cardiovascular effects such as hypotension (especially in elderly patients), arrhythmias, syncope, palpitations, angina, or edema.  Drug may mask symptoms of hypoglycemia. Advise diabetic patients to carefully monitor blood sugar levels.  Patient should take drug with food.  Advise patient against sudden discontinuation of drug. Furosemide  Drug causes sun-sensitivity. Advise patient to use sunscreen and avoid tanning beds. Patient should avoid activities requiring coordination until drug effects are realized, as drug may cause dizziness, vertigo, or blurred vision. This drug may cause hyperglycemia, hyperuricemia, constipation, diarrhea, loss of appetite, nausea, vomiting, purpuric disorder, cramps, spasticity, asthenia, headache, paresthesia, or scaling eczema. Instruct patient to report  unusual bleeding/bruising or signs/symptoms of hypotension, infection, pancreatitis, or ototoxicity (tinnitus, hearing impairment). Advise patient to report signs/symptoms of a severe skin reactions (flu-like symptoms, spreading red rash, or skin/mucous membrane blistering) or erythema multiforme. Instruct patient to eat high-potassium foods during drug therapy, as directed by healthcare professional.  Patient should not drink alcohol while taking this drug.   DRUGS TO AVOID IN HEART FAILURE  Drug or Class Mechanism  Analgesics . NSAIDs . COX-2 inhibitors . Glucocorticoids  Sodium and water retention, increased systemic vascular resistance, decreased response to diuretics   Diabetes Medications . Metformin . Thiazolidinediones o Rosiglitazone (Avandia) o Pioglitazone (Actos) . DPP4 Inhibitors o Saxagliptin (Onglyza) o Sitagliptin (Januvia)   Lactic acidosis Possible calcium channel blockade   Unknown  Antiarrhythmics . Class I  o Flecainide o Disopyramide . Class III o Sotalol . Other o Dronedarone  Negative inotrope, proarrhythmic   Proarrhythmic, beta blockade  Negative inotrope  Antihypertensives . Alpha Blockers o Doxazosin . Calcium Channel Blockers o Diltiazem o Verapamil o Nifedipine . Central Alpha Adrenergics o Moxonidine . Peripheral Vasodilators o Minoxidil  Increases renin and aldosterone  Negative inotrope    Possible sympathetic withdrawal  Unknown  Anti-infective . Itraconazole . Amphotericin B  Negative inotrope Unknown  Hematologic . Anagrelide . Cilostazol   Possible inhibition of PD IV Inhibition of PD III causing arrhythmias  Neurologic/Psychiatric . Stimulants . Anti-Seizure Drugs o Carbamazepine o Pregabalin . Antidepressants o Tricyclics o Citalopram . Parkinsons o Bromocriptine o Pergolide o Pramipexole . Antipsychotics o Clozapine . Antimigraine o Ergotamine o Methysergide . Appetite  suppressants . Bipolar o Lithium  Peripheral alpha and beta agonist activity  Negative inotrope and chronotrope Calcium channel blockade  Negative inotrope, proarrhythmic Dose-dependent QT prolongation  Excessive serotonin activity/valvular damage Excessive serotonin activity/valvular damage Unknown  IgE mediated hypersensitivy, calcium channel blockade  Excessive serotonin activity/valvular damage Excessive serotonin activity/valvular damage Valvular damage  Direct myofibrillar degeneration, adrenergic stimulation  Antimalarials . Chloroquine . Hydroxychloroquine Intracellular inhibition of lysosomal enzymes  Urologic Agents . Alpha Blockers o Doxazosin o Prazosin o Tamsulosin o Terazosin  Increased renin and aldosterone  Adapted from Page RL, et al. "Drugs That May Cause or Exacerbate Heart Failure: A Scientific Statement from the [employer]." Circulation 2016; 295:J88-C16. DOI: 10.1161/CIR.0000000000000426   MEDICATION ADHERENCES TIPS AND STRATEGIES 1. Taking medication as prescribed improves  patient outcomes in heart failure (reduces hospitalizations, improves symptoms, increases survival) 2. Side effects of medications can be managed by decreasing doses, switching agents, stopping drugs, or adding additional therapy. Please let someone in the [facility] know if you have having bothersome side effects so we can modify your regimen. Do not alter your medication regimen without talking to [place].  3. Medication reminders can help patients remember to take drugs on time. If you are missing or forgetting doses you can try linking behaviors, using pill boxes, or an electronic reminder like an alarm on your phone or an app. Some people can also get automated phone calls as medication reminders.

## 2018-08-13 NOTE — Patient Instructions (Signed)
Continue weighing daily and call for an overnight weight gain of > 2 pounds or a weekly weight gain of >5 pounds. 

## 2018-08-15 NOTE — Progress Notes (Signed)
Adam Merritt, Adam Merritt ([id number]) Visit Report for 08/13/2018 Chief Complaint Document Details Patient Name: Adam Merritt, Adam Merritt Date of Service: 08/13/2018 1:15 PM Medical Record Number: [id number] Patient Account Number: [id number] Date of Birth/Sex: 04-Sep-1945 (72 y.o. M) Treating RN: Montey Hora Primary Care Provider: [name] Other Clinician: Referring Provider: [name] Treating Provider/Extender: Melburn Hake, Verdean Murin [name]: 5 Information Obtained from: Patient Chief Complaint he is here for evaluation of bilateral lower extremity lymphedema right right toe and left LE ulcers Electronic Signature(s) Signed: 08/14/2018 1:14:32 PM By: Worthy Keeler PA-C Entered By: Worthy Keeler on 08/13/2018 13:44:30 Adam Merritt ([id number]) -------------------------------------------------------------------------------- HPI Details Patient Name: Adam Merritt Date of Service: 08/13/2018 1:15 PM Medical Record Number: [id number] Patient Account Number: [id number] Date of Birth/Sex: 08/06/1945 (72 y.o. M) Treating RN: Montey Hora Primary Care Provider: [name] Other Clinician: Referring Provider: [name] Treating Provider/Extender: Melburn Hake, Denali Becvar [name]: 18 History of Present Illness HPI Description: 10/18/17-He is here for initial evaluation of bilateral lower extremity ulcerations in the presence of venous insufficiency and lymphedema. He has been seen by vascular medicine in the past, Dr. Lucky Cowboy, last seen in 2016. He does have a history of abnormal ABIs, which is to be expected given his lymphedema and venous insufficiency. According to Epic, it appears that all attempts for arterial evaluation and/or angiography were not follow through with by patient. He does have a history of being seen in lymphedema clinic in 2018, stopped going approximately 6 months ago stating "it didn't do any good". He does not have lymphedema pumps, he does not have custom fit  compression wrap/stockings. He is diabetic and his recent A1c last month was 7.6. He admits to chronic bilateral lower extremity pain, no change in pain since blister and ulceration development. He is currently being treated with Levaquin for bronchitis. He has home health and we will continue. 10/25/17-He is here in follow-up evaluation for bilateral lower extremity ulcerationssubtle he remains on Levaquin for bronchitis. Right lower extremity with no evidence of drainage or ulceration, persistent left lower extremity ulceration. He states that home health has not been out since his appointment. He went to [facility] on Tuesday, studies revealed: RIGHT ABI 0.9, TBI 0.6 LEFT ABI 1.1, TBI 0.6 with triphasic flow bilaterally. We will continue his same treatment plan. He has been educated on compression therapy and need for elevation. He will benefit from lymphedema pumps 11/01/17-He is here in follow-up evaluation for left lower extremity ulcer. The right lower extremity remains healed. He has home health services, but they have not been out to see the patient for 2-3 weeks. He states it home health physical therapy changed his dressing yesterday after therapy; he placed Ace wrap compression. We are still waiting for lymphedema pumps, reordered d/t need for company change. 11/08/17-He is here in follow-up evaluation for left lower extremity ulcer. It is improved. Edema is significantly improved with compression therapy. We will continue with same treatment plan and he will follow-up next week. No word regarding lymphedema pumps 11/15/17-He is here in follow-up evaluation for left lower extremity ulcer. He is healed and will be discharged from wound care services. I have reached out to [facility] regarding his lymphedema pumps. They have been unable to reach the patient; the contact number they had with the patient's wife's cell phone and she has not answered any unrecognized  calls. Contact should be made today, trial planned for next week; [facility] will continue to  follow 11/27/17 on evaluation today patient has multiple blistered areas over the right lower extremity his left lower extremity appears to be doing okay. These blistered areas show signs of no infection which is great news. With that being said he did have some necrotic skin overlying which was mechanically debrided away with saline and gauze today without complication. Overall post debridement the wounds appear to be doing better but in general his swelling seems to be increased. This is obviously not good news. I think this is what has given rise to the blisters. 12/04/17 on evaluation today patient presents for follow-up concerning his bilateral lower extremity edema in the right lower extremity ulcers. He has been tolerating the dressing changes without complication. With that being said he has had no real issues with the wraps which is also good news. Overall I'm pleased with the progress he's been making. 12/11/17 on evaluation today patient appears to be doing rather well in regard to his right lateral lower extremity ulcer. He's been tolerating the dressing changes without complication. Fortunately there does not appear to be any evidence of infection at this time. Overall I'm pleased with the progress that is being made. Unfortunately he has been in the hospital due to having what sounds to be a stomach virus/flu fortunately that is starting to get better. 12/18/17 on evaluation today patient actually appears to be doing very well in regard to his bilateral lower extremities the swelling is under fairly good control his lymphedema pumps are still not up and running quite yet. With that being said he does have several areas of opening noted as far as wounds are concerned mainly over the left lower extremity. With that being said I do believe once he gets lymphedema pumps this would least help you  mention some the fluid and preventing this from occurring. Hopefully that will be set up soon sleeves are Artie in place at his home he just waiting for the machine. 12/25/17 on evaluation today patient actually appears to be doing excellent in fact all of his ulcers appear to have resolved his WILVER, TIGNOR. ([id number]) legs appear very well. I do think he needs compression stockings we have discussed this and they are actually going to go to [facility] today to elastic therapy to get this fitted for him. I think that is definitely a good thing to do. Readmission: 04/09/18 upon evaluation today this patient is seen for readmission due to bilateral lower extremity lymphedema. He has significant swelling of his extremities especially on the left although the right is also swollen he has weeping from both sides. There are no obvious open wounds at this point. Fortunately he has been doing fairly well for quite a bit of time since I last saw him. Nonetheless unfortunately this seems to have reopened and is giving quite a bit of trouble. He states this began about a week ago when he first called [place] to get in to be seen. No fevers, chills, nausea, or vomiting noted at this time. He has not been using his lymphedema pumps due to the fact that they won't fit on his leg at this point likewise is also not been using his compression for essentially the same reason. 04/16/18 upon evaluation today patient actually appears to be doing a little better in regard to the fluid in his bilateral lower extremities. With that being said he's had three falls since I saw him last week. He also states that he's been feeling very poorly. I was  concerned last week and feel like that the concern is still there as far as the congestion in his chest is concerned he seems to be breathing about the same as last week but again he states he's very weak he's not even able to walk further than from the chair to the door. His wife had  to buy a wheelchair just to be able to get them out of the house to get to the appointment today. This has me very concerned. 04/23/18 on evaluation today patient actually appears to be doing much better than last week's evaluation. At that time actually had to transport him to the ER via EMS and he subsequently was admitted for acute pulmonary edema, acute renal failure, and acute congestive heart failure. Fortunately he is doing much better. Apparently they did dialyze him and were able to take off roughly 35 pounds of fluid. Nonetheless he is feeling much better both in regard to his breathing and he's able to get around much better at this time compared to previous. Overall I'm very happy with how things are at this time. There does not appear to be any evidence of infection currently. No fevers, chills, nausea, or vomiting noted at this time. 04/30/2018 patient seen today for follow-up and management of bilateral lower extremity lymphedema. He did express being more sad today than usual due to the recent loss of his dog. He states that he has been compliant with using the lymphedema pumps. However he does admit a minute over the last 2-3 days he has not been using the pumps due to the recent loss of his dog. At this time there is no drainage or open wounds to his lower extremities. The left leg edema is measuring smaller today. Still has a significant amount of edema on bilateral lower extremities With dry flaky skin. He will be referred to the lymphedema clinic for further management. Will continue 3 layer compression wraps and follow-up in 1-2 weeks.Denies any pain, fever, chairs recently. No recent falls or injuries reported during this visit. 05/07/18 on evaluation today patient actually appears to be doing very well in regard to his lower extremities in general all things considered. With that being said he is having some pain in the legs just due to the amount of swelling. He does have an  area where he had a blister on the left lateral lower extremity this is open at this point other than that there's nothing else weeping at this time. 05/14/18 on evaluation today patient actually appears to be doing excellent all things considered in regard to his lower extremities. He still has a couple areas of weeping on each leg which has continued to be the issue for him. He does have an appointment with the lymphedema clinic although this isn't until February 2020. That was the earliest they had. In the meantime he has continued to tolerate the compression wraps without complication. 05/28/18 on evaluation today patient actually appears to be doing more poorly in regard to his left lower extremity where he has a wound open at this point. He also had a fall where he subsequently injured his right great toe which has led to an open wound at the site unfortunately. He has been tolerating the dressing changes without complication in general as far as the wraps are concerned that he has not been putting any dressing on the left 1st toe ulcer site. 06/11/18 on evaluation today patient appears to be doing much worse in regard to his  bilateral lower extremity ulcers. He has been tolerating the dressing changes without complication although his legs have not been wrapped more recently. Overall I am not very pleased with the way his legs appear. I do believe he needs to be back in compression wraps he still has not received his compression wraps from the [facility] hospital as of yet. 06/18/18 on evaluation today patient actually appears to be doing significantly better than last time I saw him. He has been tolerating the compression wraps without complication in the circumferential ulcers especially appear to be doing much better. His toe ulcer on the right in regard to the great toe is better although not as good as the legs in my pinion. No fevers chills noted Adam Merritt, Adam Merritt ([id number]) 07/02/18 on evaluation  today patient appears to be doing much better in regard to his lower extremity ulcers. Unfortunately since I last saw him he's had the distal portion of his right great toe if he dated it sounds as if this actually went downhill very quickly. I had only seen him a few days prior and the toe did not appear to be infected at that point subsequently became infected very rapidly and it was decided by the surgeon that the distal portion of the toe needed to be removed. The patient seems to be doing well in this regard he tells me. With that being said his lower extremities are doing better from the standpoint of the wounds although he is significantly swollen at this point. 07/09/18 on evaluation today patient appears to be doing better in regard to the wounds on his lower extremities. In fact everything is almost completely healed he is just a small area on the left posterior lower extremity that is open at this point. 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 With that being said he needs some kind of compression it does sound like he may not be wearing his compression, that is the wraps, during the entire time between when he's here visit to visit. Apparently his wife took the current one off because it began to "fall apart". 07/16/18 on evaluation today patient appears to be extremely swollen especially in regard to his left lower extremity unfortunately. He also has a new skin tear over the left lower extremity and there's a smaller area on the right lower extremity as well. Unfortunately this seems to be due in part to blistering and fluid buildup in his leg. He did get the reduction wraps that were ordered by the [facility] hospital for him to go to lymphedema clinic. With that being said his wounds on the legs have not healed  to the point to where they would likely accept them as a patient lymphedema clinic currently. We need to try to get this to heal. With that being said he's been taking his wraps off which is not doing him any favors at this point. In fact this is probably quite counterproductive compared to what needs to occur. We will likely need to increase to a four layer compression wrap and continue to also utilize elevation and he has to keep the wraps on not take them off as he's been doing currently hasn't had a wrap on since Saturday. 07/23/18 on evaluation today patient appears to be doing much better in regard to his bilateral lower extremities.  In fact his left lower extremity which was the largest is actually 15 cm smaller today compared to what it was last time he was here in our clinic. This is obviously good news after just one week. Nonetheless the differences he actually kept the wraps on during the entire week this time. That's not typical for him. I do believe he understands a little bit better now the severity of the situation and why it's important for him to keep these wraps on. 07/30/18 on evaluation today patient actually appears to be doing rather well in regard to his lower extremities. His legs are much smaller than they have been in the past and he actually has only one very small rudder superficial region remaining that is not closed on the left lateral/posterior lower extremity even this is almost completely close. I do believe likely next week he will be healed without any complications. I do think we need to continue the wraps however this seems to be beneficial for him. I also think it may be a good time for [place] to go ahead and see about getting the appointment with the lymphedema clinic which is supposed to be made for him in order to keep this moving along and hopefully get them into compression wraps that will in the end help him to remain healed. 08/06/18 on evaluation today patient  actually appears to be doing very well in regard as bilateral lower extremities. In fact his wounds appear to be completely healed at this time. 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 He is in the process of getting appointment with the lymphedema clinic we have made this referral were just waiting to hear back on the schedule time. We need to follow up on that today as well. 08/13/18 on evaluation today patient actually appears to be doing very well in regard to his bilateral lower extremities there are no open wounds at this point. We are gonna go ahead and see about ordering the Velcro compression wraps for him had a discussion with them about [place] doing it versus the [place] they feel like they can definitely afford going ahead and get the wraps themselves and they would prefer to try to avoid having to go to the lymphedema clinic if it all possible which I completely understand. As long as he has good compression I'm okay either way. Electronic Signature(s) Signed: 08/14/2018 1:14:32 PM By: Worthy Keeler PA-C Entered By: Worthy Keeler on 08/13/2018 14:20:36 Adam Merritt ([id number]) -------------------------------------------------------------------------------- Physical Exam Details Patient Name: Adam Merritt Date of Service: 08/13/2018 1:15 PM Medical Record Number: [id number] Patient Account Number: [id number] Date of Birth/Sex: 1945-11-19 (72 y.o. M) Treating RN: Montey Hora Primary Care Provider: [name] Other Clinician: Referring Provider: [name] Treating Provider/Extender: STONE III, Shaena Parkerson [name]: 18 Constitutional Obese and well-hydrated in no acute distress. Respiratory normal breathing without difficulty. clear to auscultation bilaterally. Cardiovascular regular rate and rhythm with normal S1, S2. Psychiatric this patient is able to make decisions and demonstrates good insight into  disease process. Alert and Oriented x 3. pleasant and cooperative. Notes Patient's wound bed currently shows no open wounds at this time he has complete epithelialization this is excellent news his main issue is lymphedema which is doing extremely well at this point with the compression wraps. We're gonna therefore wrapping one more week in order the Velcro compression for him at this time. The patient is in agreement that  plan. Electronic Signature(s) Signed: 08/14/2018 1:14:32 PM By: Worthy Keeler PA-C Entered By: Worthy Keeler on 08/13/2018 14:21:25 Adam Merritt ([id number]) -------------------------------------------------------------------------------- Physician Orders Details Patient Name: Adam Merritt Date of Service: 08/13/2018 1:15 PM Medical Record Number: [id number] Patient Account Number: [id number] Date of Birth/Sex: June 09, 1945 (72 y.o. M) Treating RN: Cornell Barman Primary Care Provider: [name] Other Clinician: Referring Provider: [name] Treating Provider/Extender: Melburn Hake, Elinora Weigand [name]: 86 Verbal / Phone Orders: No Diagnosis Coding ICD-10 Coding Code Description I89.0 Lymphedema, not elsewhere classified L97.512 Non-pressure chronic ulcer of other part of right foot with fat layer exposed L97.822 Non-pressure chronic ulcer of other part of left lower leg with fat layer exposed L98.492 Non-pressure chronic ulcer of skin of other sites with fat layer exposed E66.9 Obesity, unspecified Edema Control o 4 Layer Compression System - Bilateral Electronic Signature(s) Signed: 08/13/2018 5:53:55 PM By: Gretta Cool, BSN, RN, CWS, Kim RN, BSN Signed: 08/14/2018 1:14:32 PM By: Worthy Keeler PA-C Entered By: Gretta Cool, BSN, RN, CWS, Kim on 08/13/2018 17:53:55 Adam Merritt ([id number]) -------------------------------------------------------------------------------- Problem List Details Patient Name: Adam Merritt Date of Service: 08/13/2018 1:15  PM Medical Record Number: [id number] Patient Account Number: [id number] Date of Birth/Sex: [birth year] (72 y.o. M) Treating RN: Montey Hora Primary Care Provider: [name] Other Clinician: Referring Provider: [name] Treating Provider/Extender: Melburn Hake, Axil Copeman [name]: 36 Active Problems ICD-10 Evaluated Encounter Code Description Active Date Today Diagnosis I89.0 Lymphedema, not elsewhere classified 04/09/2018 No Yes L97.512 Non-pressure chronic ulcer of other part of right foot with fat 05/28/2018 No Yes layer exposed L97.822 Non-pressure chronic ulcer of other part of left lower leg with 05/28/2018 No Yes fat layer exposed L98.492 Non-pressure chronic ulcer of skin of other sites with fat layer 06/11/2018 No Yes exposed E66.9 Obesity, unspecified 04/09/2018 No Yes Inactive Problems Resolved Problems ICD-10 Code Description Active Date Resolved Date L03.115 Cellulitis of right lower limb 04/09/2018 04/09/2018 L03.116 Cellulitis of left lower limb 04/09/2018 04/09/2018 Electronic Signature(s) Signed: 08/14/2018 1:14:32 PM By: Worthy Keeler PA-C Entered By: Worthy Keeler on 08/13/2018 13:44:24 Adam Merritt ([id number]) Adam Merritt, Adam Merritt ([id number]) -------------------------------------------------------------------------------- Progress Note Details Patient Name: Adam Merritt Date of Service: 08/13/2018 1:15 PM Medical Record Number: [id number] Patient Account Number: [id number] Date of Birth/Sex: 19-Jan-1946 (72 y.o. M) Treating RN: Montey Hora Primary Care Provider: [name] Other Clinician: Referring Provider: [name] Treating Provider/Extender: Melburn Hake, Lynard Postlewait [name]: 18 Subjective Chief Complaint Information obtained from Patient he is here for evaluation of bilateral lower extremity lymphedema right right toe and left LE ulcers History of Present Illness (HPI) 10/18/17-He is here for initial evaluation of bilateral lower extremity  ulcerations in the presence of venous insufficiency and lymphedema. He has been seen by vascular medicine in the past, Dr. Lucky Cowboy, last seen in 2016. He does have a history of abnormal ABIs, which is to be expected given his lymphedema and venous insufficiency. According to Epic, it appears that all attempts for arterial evaluation and/or angiography were not follow through with by patient. He does have a history of being seen in lymphedema clinic in 2018, stopped going approximately 6 months ago stating "it didn't do any good". He does not have lymphedema pumps, he does not have custom fit compression wrap/stockings. He is diabetic and his recent A1c last month was 7.6. He admits to chronic bilateral lower extremity pain, no change in pain since blister and ulceration development. He is currently being treated  with Levaquin for bronchitis. He has home health and we will continue. 10/25/17-He is here in follow-up evaluation for bilateral lower extremity ulcerationssubtle he remains on Levaquin for bronchitis. Right lower extremity with no evidence of drainage or ulceration, persistent left lower extremity ulceration. He states that home health has not been out since his appointment. He went to [facility] on Tuesday, studies revealed: RIGHT ABI 0.9, TBI 0.6 LEFT ABI 1.1, TBI 0.6 with triphasic flow bilaterally. We will continue his same treatment plan. He has been educated on compression therapy and need for elevation. He will benefit from lymphedema pumps 11/01/17-He is here in follow-up evaluation for left lower extremity ulcer. The right lower extremity remains healed. He has home health services, but they have not been out to see the patient for 2-3 weeks. He states it home health physical therapy changed his dressing yesterday after therapy; he placed Ace wrap compression. We are still waiting for lymphedema pumps, reordered d/t need for company change. 11/08/17-He is here in follow-up  evaluation for left lower extremity ulcer. It is improved. Edema is significantly improved with compression therapy. We will continue with same treatment plan and he will follow-up next week. No word regarding lymphedema pumps 11/15/17-He is here in follow-up evaluation for left lower extremity ulcer. He is healed and will be discharged from wound care services. I have reached out to [facility] regarding his lymphedema pumps. They have been unable to reach the patient; the contact number they had with the patient's wife's cell phone and she has not answered any unrecognized calls. Contact should be made today, trial planned for next week; [facility] will continue to follow 11/27/17 on evaluation today patient has multiple blistered areas over the right lower extremity his left lower extremity appears to be doing okay. These blistered areas show signs of no infection which is great news. With that being said he did have some necrotic skin overlying which was mechanically debrided away with saline and gauze today without complication. Overall post debridement the wounds appear to be doing better but in general his swelling seems to be increased. This is obviously not good news. I think this is what has given rise to the blisters. 12/04/17 on evaluation today patient presents for follow-up concerning his bilateral lower extremity edema in the right lower extremity ulcers. He has been tolerating the dressing changes without complication. With that being said he has had no real issues with the wraps which is also good news. Overall I'm pleased with the progress he's been making. 12/11/17 on evaluation today patient appears to be doing rather well in regard to his right lateral lower extremity ulcer. He's been tolerating the dressing changes without complication. Fortunately there does not appear to be any evidence of infection at this time. Overall I'm pleased with the progress that is being  made. Unfortunately he has been in the hospital due to having what sounds to be a stomach virus/flu fortunately that is starting to get better. 12/18/17 on evaluation today patient actually appears to be doing very well in regard to his bilateral lower extremities the Adam Merritt, Adam Merritt. ([id number]) swelling is under fairly good control his lymphedema pumps are still not up and running quite yet. With that being said he does have several areas of opening noted as far as wounds are concerned mainly over the left lower extremity. With that being said I do believe once he gets lymphedema pumps this would least help you mention some the fluid  and preventing this from occurring. Hopefully that will be set up soon sleeves are Artie in place at his home he just waiting for the machine. 12/25/17 on evaluation today patient actually appears to be doing excellent in fact all of his ulcers appear to have resolved his legs appear very well. I do think he needs compression stockings we have discussed this and they are actually going to go to [place] today to elastic therapy to get this fitted for him. I think that is definitely a good thing to do. Readmission: 04/09/18 upon evaluation today this patient is seen for readmission due to bilateral lower extremity lymphedema. He has significant swelling of his extremities especially on the left although the right is also swollen he has weeping from both sides. There are no obvious open wounds at this point. Fortunately he has been doing fairly well for quite a bit of time since I last saw him. Nonetheless unfortunately this seems to have reopened and is giving quite a bit of trouble. He states this began about a week ago when he first called [place] to get in to be seen. No fevers, chills, nausea, or vomiting noted at this time. He has not been using his lymphedema pumps due to the fact that they won't fit on his leg at this point likewise is also not been using his  compression for essentially the same reason. 04/16/18 upon evaluation today patient actually appears to be doing a little better in regard to the fluid in his bilateral lower extremities. With that being said he's had three falls since I saw him last week. He also states that he's been feeling very poorly. I was concerned last week and feel like that the concern is still there as far as the congestion in his chest is concerned he seems to be breathing about the same as last week but again he states he's very weak he's not even able to walk further than from the chair to the door. His wife had to buy a wheelchair just to be able to get them out of the house to get to the appointment today. This has me very concerned. 04/23/18 on evaluation today patient actually appears to be doing much better than last week's evaluation. At that time actually had to transport him to the ER via EMS and he subsequently was admitted for acute pulmonary edema, acute renal failure, and acute congestive heart failure. Fortunately he is doing much better. Apparently they did dialyze him and were able to take off roughly 35 pounds of fluid. Nonetheless he is feeling much better both in regard to his breathing and he's able to get around much better at this time compared to previous. Overall I'm very happy with how things are at this time. There does not appear to be any evidence of infection currently. No fevers, chills, nausea, or vomiting noted at this time. 04/30/2018 patient seen today for follow-up and management of bilateral lower extremity lymphedema. He did express being more sad today than usual due to the recent loss of his dog. He states that he has been compliant with using the lymphedema pumps. However he does admit a minute over the last 2-3 days he has not been using the pumps due to the recent loss of his dog. At this time there is no drainage or open wounds to his lower extremities. The left leg edema  is measuring smaller today. Still has a significant amount of edema on bilateral lower extremities With dry  flaky skin. He will be referred to the lymphedema clinic for further management. Will continue 3 layer compression wraps and follow-up in 1-2 weeks.Denies any pain, fever, chairs recently. No recent falls or injuries reported during this visit. 05/07/18 on evaluation today patient actually appears to be doing very well in regard to his lower extremities in general all things considered. With that being said he is having some pain in the legs just due to the amount of swelling. He does have an area where he had a blister on the left lateral lower extremity this is open at this point other than that there's nothing else weeping at this time. 05/14/18 on evaluation today patient actually appears to be doing excellent all things considered in regard to his lower extremities. He still has a couple areas of weeping on each leg which has continued to be the issue for him. He does have an appointment with the lymphedema clinic although this isn't until February 2020. That was the earliest they had. In the meantime he has continued to tolerate the compression wraps without complication. 05/28/18 on evaluation today patient actually appears to be doing more poorly in regard to his left lower extremity where he has a wound open at this point. He also had a fall where he subsequently injured his right great toe which has led to an open wound at the site unfortunately. He has been tolerating the dressing changes without complication in general as far as the wraps are concerned that he has not been putting any dressing on the left 1st toe ulcer site. 06/11/18 on evaluation today patient appears to be doing much worse in regard to his bilateral lower extremity ulcers. He has been tolerating the dressing changes without complication although his legs have not been wrapped more recently. Overall I am not very  pleased with the way his legs appear. I do believe he needs to be back in compression wraps he still has not received his compression wraps from the [facility] as of yet. Adam Merritt, Adam Merritt ([id number]) 06/18/18 on evaluation today patient actually appears to be doing significantly better than last time I saw him. He has been tolerating the compression wraps without complication in the circumferential ulcers especially appear to be doing much better. His toe ulcer on the right in regard to the great toe is better although not as good as the legs in my pinion. No fevers chills noted 07/02/18 on evaluation today patient appears to be doing much better in regard to his lower extremity ulcers. Unfortunately since I last saw him he's had the distal portion of his right great toe if he dated it sounds as if this actually went downhill very quickly. I had only seen him a few days prior and the toe did not appear to be infected at that point subsequently became infected very rapidly and it was decided by the surgeon that the distal portion of the toe needed to be removed. The patient seems to be doing well in this regard he tells me. With that being said his lower extremities are doing better from the standpoint of the wounds although he is significantly swollen at this point. 07/09/18 on evaluation today patient appears to be doing better in regard to the wounds on his lower extremities. In fact everything is almost completely healed he is just a small area on the left posterior lower extremity that is open at this point. He is actually seeing the doctor tomorrow regarding his toe amputation  and possibly having the sutures removed that point until this is complete he cannot see the lymphedema clinic apparently according to what he is being told. With that being said he needs some kind of compression it does sound like he may not be wearing his compression, that is the wraps, during the entire time between when  he's here visit to visit. Apparently his wife took the current one off because it began to "fall apart". 07/16/18 on evaluation today patient appears to be extremely swollen especially in regard to his left lower extremity unfortunately. He also has a new skin tear over the left lower extremity and there's a smaller area on the right lower extremity as well. Unfortunately this seems to be due in part to blistering and fluid buildup in his leg. He did get the reduction wraps that were ordered by the [facility] hospital for him to go to lymphedema clinic. With that being said his wounds on the legs have not healed to the point to where they would likely accept them as a patient lymphedema clinic currently. We need to try to get this to heal. With that being said he's been taking his wraps off which is not doing him any favors at this point. In fact this is probably quite counterproductive compared to what needs to occur. We will likely need to increase to a four layer compression wrap and continue to also utilize elevation and he has to keep the wraps on not take them off as he's been doing currently hasn't had a wrap on since Saturday. 07/23/18 on evaluation today patient appears to be doing much better in regard to his bilateral lower extremities. In fact his left lower extremity which was the largest is actually 15 cm smaller today compared to what it was last time he was here in our clinic. This is obviously good news after just one week. Nonetheless the differences he actually kept the wraps on during the entire week this time. That's not typical for him. I do believe he understands a little bit better now the severity of the situation and why it's important for him to keep these wraps on. 07/30/18 on evaluation today patient actually appears to be doing rather well in regard to his lower extremities. His legs are much smaller than they have been in the past and he actually has only one very small rudder  superficial region remaining that is not closed on the left lateral/posterior lower extremity even this is almost completely close. I do believe likely next week he will be healed without any complications. I do think we need to continue the wraps however this seems to be beneficial for him. I also think it may be a good time for [place] to go ahead and see about getting the appointment with the lymphedema clinic which is supposed to be made for him in order to keep this moving along and hopefully get them into compression wraps that will in the end help him to remain healed. 08/06/18 on evaluation today patient actually appears to be doing very well in regard as bilateral lower extremities. In fact his wounds appear to be completely healed at this time. 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 He is in the process of getting appointment with the lymphedema clinic we have made this referral were just waiting to hear back on the schedule time. We need to follow up on that today as well. 08/13/18 on evaluation  today patient actually appears to be doing very well in regard to his bilateral lower extremities there are no open wounds at this point. We are gonna go ahead and see about ordering the Velcro compression wraps for him had a discussion with them about [place] doing it versus the [place] they feel like they can definitely afford going ahead and get the wraps themselves and they would prefer to try to avoid having to go to the lymphedema clinic if it all possible which I completely understand. As long as he has good compression I'm okay either way. Patient History Information obtained from Patient. Family History Adam Merritt, Adam Merritt ([id number]) Cancer - Paternal Grandparents, Kidney Disease - Siblings, Lung Disease - Mother, No family history of Diabetes, Heart Disease, Hereditary Spherocytosis, Hypertension, Seizures, Stroke, Thyroid  Problems, Tuberculosis. Social History Never smoker, Marital Status - Married, Alcohol Use - Never, Drug Use - No History, Caffeine Use - Never. Medical History Eyes Denies history of Cataracts, Glaucoma, Optic Neuritis Ear/Nose/Mouth/Throat Denies history of Chronic sinus problems/congestion, Middle ear problems Hematologic/Lymphatic Patient has history of Anemia - aplastic anemia, Lymphedema Denies history of Hemophilia, Human Immunodeficiency Virus, Sickle Cell Disease Respiratory Patient has history of Chronic Obstructive Pulmonary Disease (COPD), Sleep Apnea Denies history of Aspiration, Asthma, Pneumothorax, Tuberculosis Cardiovascular Patient has history of Congestive Heart Failure, Coronary Artery Disease, Hypertension, Peripheral Venous Disease Denies history of Angina, Arrhythmia, Deep Vein Thrombosis, Hypotension, Myocardial Infarction, Peripheral Arterial Disease, Phlebitis, Vasculitis Gastrointestinal Denies history of Cirrhosis , Colitis, Crohn s, Hepatitis A, Hepatitis B, Hepatitis C Endocrine Patient has history of Type II Diabetes Denies history of Type I Diabetes Genitourinary Denies history of End Stage Renal Disease Immunological Denies history of Lupus Erythematosus, Raynaud s, Scleroderma Integumentary (Skin) Denies history of History of Burn, History of pressure wounds Musculoskeletal Patient has history of Osteoarthritis Denies history of Gout, Rheumatoid Arthritis, Osteomyelitis Neurologic Patient has history of Neuropathy Denies history of Dementia, Quadriplegia, Paraplegia, Seizure Disorder Oncologic Patient has history of Received Chemotherapy - last dose 2006 Denies history of Received Radiation Psychiatric Denies history of Anorexia/bulimia, Confinement Anxiety Hospitalization/Surgery History - 11/03/2013, [facility], bronchitis. Medical And Surgical History Notes Respiratory home O2 at night as needed Cardiovascular varicose veins Neurologic CVA  - 1992 Review of Systems (ROS) Constitutional Symptoms (General Health) Denies complaints or symptoms of Fever, Chills. Respiratory The patient has no complaints or symptoms. Adam Merritt, Adam Merritt ([id number]) Cardiovascular Complains or has symptoms of LE edema. Psychiatric The patient has no complaints or symptoms. Objective Constitutional Obese and well-hydrated in no acute distress. Vitals Time Taken: 1:27 PM, Height: 66 in, Weight: 323 lbs, BMI: 52.1, Temperature: 98.1 F, Pulse: 87 bpm, Respiratory Rate: 18 breaths/min, Blood Pressure: 159/91 mmHg. Respiratory normal breathing without difficulty. clear to auscultation bilaterally. Cardiovascular regular rate and rhythm with normal S1, S2. Psychiatric this patient is able to make decisions and demonstrates good insight into disease process. Alert and Oriented x 3. pleasant and cooperative. General Notes: Patient's wound bed currently shows no open wounds at this time he has complete epithelialization this is excellent news his main issue is lymphedema which is doing extremely well at this point with the compression wraps. We're gonna therefore wrapping one more week in order the Velcro compression for him at this time. The patient is in agreement that plan. 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 If anything changes worsens meantime he will contact the office and let me know. Please see above for specific wound care orders. We will see patient for  re-evaluation in 1 week(s) here in the clinic. If anything worsens or changes patient will contact our office for additional recommendations. Electronic Signature(s) Signed: 08/14/2018 1:14:32 PM By: Worthy Keeler PA-C Entered By: Worthy Keeler on 08/13/2018 14:21:44 Adam Merritt ([id number]) -------------------------------------------------------------------------------- ROS/PFSH Details Patient Name: Adam Merritt Date of Service: 08/13/2018 1:15 PM Medical Record Number: [id number] Patient Account Number: [id number] Date of Birth/Sex: [birth year] (72 y.o. M) Treating RN: Montey Hora Primary Care Provider: [name] Other Clinician: Referring Provider: [name] Treating Provider/Extender: Melburn Hake, Cylinda Santoli [name]: 70 Information Obtained From Patient Wound History Do you currently have one or more open woundso No Have you tested positive for osteomyelitis (bone infection)o No Have you had any tests for circulation on your legso Yes Constitutional Symptoms (General Health) Complaints and Symptoms: Negative for: Fever; Chills Cardiovascular Complaints and Symptoms: Positive for: LE edema Medical History: Positive for: Congestive Heart Failure; Coronary Artery Disease; Hypertension; Peripheral Venous Disease Negative for: Angina; Arrhythmia; Deep Vein Thrombosis; Hypotension; Myocardial Infarction; Peripheral Arterial Disease; Phlebitis; Vasculitis Past Medical History Notes: varicose veins Eyes Medical History: Negative for: Cataracts; Glaucoma; Optic Neuritis Ear/Nose/Mouth/Throat Medical History: Negative for: Chronic sinus problems/congestion; Middle ear problems Hematologic/Lymphatic Medical History: Positive for: Anemia - aplastic anemia; Lymphedema Negative for: Hemophilia; Human Immunodeficiency Virus; Sickle Cell Disease Respiratory Complaints and Symptoms: No Complaints or Symptoms Medical History: Positive for: Chronic Obstructive  Pulmonary Disease (COPD); Sleep Apnea Negative for: Aspiration; Asthma; Pneumothorax; Tuberculosis Past Medical History Notes: Adam Merritt, Adam Merritt ([id number]) home O2 at night as needed Gastrointestinal Medical History: Negative for: Cirrhosis ; Colitis; Crohnos; Hepatitis A; Hepatitis B; Hepatitis C Endocrine Medical History: Positive for: Type II Diabetes Negative for: Type I Diabetes Time with diabetes: since 2006 Treated with: Insulin Blood sugar tested every day: Yes Tested : Blood sugar testing results: Breakfast: 209 Genitourinary Medical History: Negative for: End Stage Renal Disease Immunological Medical History: Negative for: Lupus Erythematosus; Raynaudos; Scleroderma Integumentary (Skin) Medical History: Negative for: History of Burn; History of pressure wounds Musculoskeletal Medical History: Positive for: Osteoarthritis Negative for: Gout; Rheumatoid Arthritis; Osteomyelitis Neurologic Medical History: Positive for: Neuropathy Negative for: Dementia; Quadriplegia; Paraplegia; Seizure Disorder Past Medical History Notes: CVA - 1992 Oncologic Medical History: Positive for: Received Chemotherapy - last dose 2006 Negative for: Received Radiation Psychiatric Complaints and Symptoms: No Complaints or Symptoms Medical History: Adam Merritt, Adam Merritt ([id number]) Negative for: Anorexia/bulimia; Confinement Anxiety Immunizations Pneumococcal Vaccine: Received Pneumococcal Vaccination: Yes Implantable Devices No devices added Hospitalization / Surgery History Name of Hospital Purpose of Hospitalization/Surgery Date [facility] bronchitis 11/03/2013 Family and Social History Cancer: Yes - Paternal Grandparents; Diabetes: No; Heart Disease: No; Hereditary Spherocytosis: No; Hypertension: No; Kidney Disease: Yes - Siblings; Lung Disease: Yes - Mother; Seizures: No; Stroke: No; Thyroid Problems: No; Tuberculosis: No; Never smoker; Marital Status - Married; Alcohol Use: Never;  Drug Use: No History; Caffeine Use: Never; Financial Concerns: No; Food, Clothing or Shelter Needs: No; Support System Lacking: No; Transportation Concerns: No; Advanced Directives: No; Patient does not want information on Advanced  Directives Physician Affirmation I have reviewed and agree with the above information. Electronic Signature(s) Signed: 08/14/2018 1:14:32 PM By: Worthy Keeler PA-C Signed: 08/14/2018 4:23:17 PM By: Montey Hora Entered By: Worthy Keeler on 08/13/2018 14:20:52 Adam Merritt ([id number]) -------------------------------------------------------------------------------- SuperBill Details Patient Name: Adam Merritt Date of Service: 08/13/2018 Medical Record Number: [id number] Patient Account Number: [id number] Date of Birth/Sex: [birth year] (72 y.o. 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 Physician Procedures CPT4 Code Description: [id number] - WC PHYS LEVEL 4 - EST PT ICD-10 Diagnosis Description I89.0 Lymphedema, not elsewhere classified L97.512 Non-pressure chronic ulcer of other part of right foot with fat L97.822 Non-pressure chronic ulcer of other  part of left lower leg with L98.492 Non-pressure chronic ulcer of skin of other sites with fat layer Modifier:  layer exposed fat layer expos exposed Quantity: 1 ed Electronic Signature(s) Signed: 08/14/2018 1:45:21 PM By: Gretta Cool, BSN, RN, CWS, Kim RN, BSN Previous Signature: 08/14/2018 1:14:32 PM Version By: Worthy Keeler PA-C Entered By: Gretta Cool, BSN, RN, CWS, Kim on 08/14/2018 13:45:21

## 2018-08-15 NOTE — Progress Notes (Signed)
Adam Merritt ([id number]) Visit Report for 08/13/2018 Arrival Information Details Patient Name: Adam Merritt, Adam Merritt Date of Service: 08/13/2018 1:15 PM Medical Record Number: [id number] Patient Account Number: [id number] Date of Birth/Sex: November 09, 1945 (72 y.o. M) Treating RN: Adam Merritt Primary Care Adam Merritt: Adam Merritt Other Clinician: Referring Adam Merritt: Adam Merritt Treating Adam Merritt/Extender: Adam Merritt: 18 Visit Information History Since Last Visit Added or deleted any medications: No Patient Arrived: Walker Any new allergies or adverse reactions: No Arrival Time: 13:23 Had a fall or experienced change in No Accompanied By: wife activities of daily living that may affect Transfer Assistance: None risk of falls: Patient Identification Verified: Yes Signs or symptoms of abuse/neglect since last visito No Secondary Verification Process Yes Hospitalized since last visit: No Completed: Has Dressing in Place as Prescribed: Yes Patient Has Alerts: Yes Has Compression in Place as Prescribed: Yes Patient Alerts: Patient on Blood Pain Present Now: No Thinner Aspirin 325mg  Type II Diabetes ABI 5/19 L 1.1 R 0.9 TBI L 0.6 R 0.6 Electronic Signature(s) Signed: 08/14/2018 8:48:17 AM By: Adam Merritt Entered By: Adam Merritt on 08/13/2018 13:26:53 Adam Merritt ([id number]) -------------------------------------------------------------------------------- Encounter Discharge Information Details Patient Name: Adam Merritt Date of Service: 08/13/2018 1:15 PM Medical Record Number: [id number] Patient Account Number: [id number] Date of Birth/Sex: [birth year] (72 y.o. M) Treating RN: Adam Merritt Primary Care Arma Reining: Adam Merritt Other Clinician: Referring Adam Merritt: Adam Merritt Treating Adam Merritt/Extender: Adam Merritt: 33 Encounter Discharge Information Items Discharge Condition: Stable Ambulatory Status: Walker Discharge  Destination: Home Transportation: Private Auto Accompanied By: wife Schedule Follow-up Appointment: Yes Clinical Summary of Care: Electronic Signature(s) Signed: 08/14/2018 8:24:09 AM By: Adam Merritt Entered By: Adam Merritt on 08/14/2018 08:24:09 Adam Merritt ([id number]) -------------------------------------------------------------------------------- Lower Extremity Assessment Details Patient Name: Adam Merritt Date of Service: 08/13/2018 1:15 PM Medical Record Number: [id number] Patient Account Number: [id number] Date of Birth/Sex: 16-Sep-1945 (72 y.o. M) Treating RN: Adam Merritt Primary Care Izela Altier: Adam Merritt Other Clinician: Referring Sufyan Meidinger: Adam Merritt Treating Adam Merritt/Extender: Adam Merritt: 18 Edema Assessment Assessed: [Left: No] [Right: No] [Left: Edema] [Right: :] Calf Left: Right: Point of Measurement: 3427 cm From Medial Instep 53 cm 44 cm Ankle Left: Right: Point of Measurement: 12 cm From Medial Instep 34 cm 27 cm Vascular Assessment Pulses: Dorsalis Pedis Palpable: [Left:Yes] [Right:Yes] Posterior Tibial Palpable: [Left:Yes] [Right:Yes] Extremity colors, hair growth, and conditions: Extremity Color: [Left:Red] [Right:Red] Hair Growth on Extremity: [Left:No] [Right:No] Temperature of Extremity: [Left:Merritt] [Right:Merritt] Capillary Refill: [Left:< 3 seconds] [Right:< 3 seconds] Toe Nail Assessment Left: Right: Thick: Yes Yes Discolored: Yes Yes Deformed: Yes Yes Improper Length and Hygiene: No No Electronic Signature(s) Signed: 08/14/2018 8:48:17 AM By: Adam Merritt Entered By: Adam Merritt on 08/13/2018 13:45:00 Adam Merritt ([id number]) -------------------------------------------------------------------------------- Multi Wound Chart Details Patient Name: Adam Merritt Date of Service: 08/13/2018 1:15 PM Medical Record Number: [id number] Patient Account Number: [id number] Date of Birth/Sex: 12-18-1945 (72  y.o. M) Treating RN: Adam Merritt Primary Care Janesha Brissette: Adam Merritt Other Clinician: Referring Deneice Wack: Adam Merritt Treating Shavy Beachem/Extender: Adam Merritt: 18 Vital Signs Height(in): 66 Pulse(bpm): 87 Weight(lbs): 323 Blood Pressure(mmHg): 159/91 Body Mass Index(BMI): 52 Temperature(F): 98.1 Respiratory Rate 18 (breaths/min): Wound Assessments Merritt Notes Electronic Signature(s) Signed: 08/13/2018 6:07:40 PM By: Adam Merritt, BSN, RN, CWS, Kim RN, BSN Entered By: Adam Merritt, BSN, RN, CWS, Adam Merritt on 08/13/2018 14:14:34 Adam Merritt ([id number]) -------------------------------------------------------------------------------- [address] Details Patient Name: [name], [name]. Date of Service:  08/13/2018 1:15 PM Medical Record Number: [id number] Patient Account Number: [id number] Date of Birth/Sex: 31-Mar-1946 (73 y.o. M) Treating RN: Adam Merritt Primary Care Anastasia Tompson: Adam Merritt Other Clinician: Referring Arminta Gamm: Adam Merritt Treating Jennafer Gladue/Extender: Adam Merritt: 89 Active Inactive Abuse / Safety / Falls / Self Care Management Nursing Diagnoses: Potential for falls Goals: Patient will remain injury free related to falls Date Initiated: 04/09/2018 Target Resolution Date: 07/13/2018 Goal Status: Active Interventions: Assess fall risk on admission and as needed Notes: Orientation to the Wound Care Program Nursing Diagnoses: Knowledge deficit related to the wound healing center program Goals: Patient/caregiver will verbalize understanding of the [facility] Date Initiated: 04/09/2018 Target Resolution Date: 07/13/2018 Goal Status: Active Interventions: Provide education on orientation to the wound center Notes: Venous Leg Ulcer Nursing Diagnoses: Actual venous Insuffiency (use after diagnosis is confirmed) Goals: Patient will maintain optimal edema control Date Initiated: 04/09/2018 Target  Resolution Date: 07/13/2018 Goal Status: Active Interventions: Assess peripheral edema status every visit. MONT, [name] Compression as ordered Provide education on venous insufficiency Notes: Electronic Signature(s) Signed: 08/13/2018 6:07:40 PM By: Adam Merritt, BSN, RN, CWS, Kim RN, BSN Entered By: Adam Merritt, BSN, RN, CWS, Adam Merritt on 08/13/2018 14:14:26 Adam Merritt ([id number]) -------------------------------------------------------------------------------- Pain Assessment Details Patient Name: Adam Merritt Date of Service: 08/13/2018 1:15 PM Medical Record Number: [id number] Patient Account Number: [id number] Date of Birth/Sex: [birth year] (72 y.o. M) Treating RN: Adam Merritt Primary Care Shavell Nored: Adam Merritt Other Clinician: Referring Florean Hoobler: Adam Merritt Treating Latissa Frick/Extender: Adam Merritt: 25 Active Problems Location of Pain Severity and Description of Pain Patient Has Paino No Site Locations Pain Management and Medication Current Pain Management: Electronic Signature(s) Signed: 08/14/2018 8:48:17 AM By: Adam Merritt Entered By: Adam Merritt on 08/13/2018 13:27:07 Adam Merritt ([id number]) -------------------------------------------------------------------------------- Patient/Caregiver Education Details Patient Name: Adam Merritt Date of Service: 08/13/2018 1:15 PM Medical Record Number: [id number] Patient Account Number: [id number] Date of Birth/Gender: [birth year] (72 y.o. M) Treating RN: Adam Merritt Primary Care Physician: Adam Merritt Other Clinician: Referring Physician: Clayborn Merritt Treating Physician/Extender: Sharalyn Ink in Merritt: 98 Education Assessment Education Provided To: Patient Education Topics Provided Venous: Handouts: Controlling Swelling with Multilayered Compression Wraps Methods: Demonstration, Explain/Verbal Responses: State content correctly Electronic Signature(s) Signed: 08/13/2018  6:07:40 PM By: Adam Merritt, BSN, RN, CWS, Kim RN, BSN Entered By: Adam Merritt, BSN, RN, CWS, Adam Merritt on 08/13/2018 17:54:17 Adam Merritt ([id number]) -------------------------------------------------------------------------------- [place] Details Patient Name: Adam Merritt Date of Service: 08/13/2018 1:15 PM Medical Record Number: [id number] Patient Account Number: [id number] Date of Birth/Sex: [birth year] (72 y.o. M) Treating RN: Adam Merritt Primary Care Emaline Karnes: Adam Merritt Other Clinician: Referring Royal Vandevoort: Adam Merritt Treating Friedrich Harriott/Extender: Adam Merritt: 18 Vital Signs Time Taken: 13:27 Temperature (F): 98.1 Height (in): 66 Pulse (bpm): 87 Weight (lbs): 323 Respiratory Rate (breaths/min): 18 Body Mass Index (BMI): 52.1 Blood Pressure (mmHg): 159/91 Reference Range: 80 - 120 mg / dl Electronic Signature(s) Signed: 08/14/2018 8:48:17 AM By: Adam Merritt Entered By: Adam Merritt on 08/13/2018 13:32:05

## 2018-08-19 ENCOUNTER — Encounter: Payer: Medicare Other | Admitting: Occupational Therapy

## 2018-08-20 ENCOUNTER — Other Ambulatory Visit: Payer: Self-pay

## 2018-08-20 ENCOUNTER — Encounter: Payer: Medicare Other | Admitting: Physician Assistant

## 2018-08-20 ENCOUNTER — Telehealth: Payer: Self-pay

## 2018-08-20 DIAGNOSIS — E11621 Type 2 diabetes mellitus with foot ulcer: Secondary | ICD-10-CM | POA: Diagnosis not present

## 2018-08-20 NOTE — Telephone Encounter (Signed)
Pt wife advised that take linzess and call us back if he don't have any bowel movement

## 2018-08-20 NOTE — Telephone Encounter (Signed)
We can provide samples linzess 157mcg daily. May need to try OTC magnesium citrate if no improvement over next several days.

## 2018-08-21 ENCOUNTER — Telehealth: Payer: Self-pay | Admitting: Adult Health

## 2018-08-21 NOTE — Telephone Encounter (Signed)
error 

## 2018-08-21 NOTE — Progress Notes (Signed)
ICHAEL, PULLARA ([id number]) Visit Report for 08/20/2018 Chief Complaint Document Details Patient Name: Adam Merritt, Adam Merritt Date of Service: 08/20/2018 2:15 PM Medical Record Number: [id number] Patient Account Number: [id number] Date of Birth/Sex: [birth year] (73 y.o. M) Treating RN: Montey Hora Primary Care Provider: [name] Other Clinician: Referring Provider: [name] Treating Provider/Extender: Melburn Hake, [name]: 69 Information Obtained from: Patient Chief Complaint he is here for evaluation of bilateral lower extremity lymphedema right right toe and left LE ulcers Electronic Signature(s) Signed: 08/20/2018 5:25:20 PM By: Worthy Keeler PA-C Entered By: Worthy Keeler on 08/20/2018 14:11:59 Adam Merritt ([id number]) -------------------------------------------------------------------------------- HPI Details Patient Name: Adam Merritt Date of Service: 08/20/2018 2:15 PM Medical Record Number: [id number] Patient Account Number: [id number] Date of Birth/Sex: 1946-04-05 (73 y.o. M) Treating RN: Montey Hora Primary Care Provider: [name] Other Clinician: Referring Provider: [name] Treating Provider/Extender: Melburn Hake, [name]: 73 History of Present Illness HPI Description: 10/18/17-He is here for initial evaluation of bilateral lower extremity ulcerations in the presence of venous insufficiency and lymphedema. He has been seen by vascular medicine in the past, Dr. Lucky Cowboy, last seen in 2016. He does have a history of abnormal ABIs, which is to be expected given his lymphedema and venous insufficiency. According to Epic, it appears that all attempts for arterial evaluation and/or angiography were not follow through with by patient. He does have a history of being seen in lymphedema clinic in 2018, stopped going approximately 6 months ago stating "it didn't do any good". He does not have lymphedema pumps, he does not have custom fit  compression wrap/stockings. He is diabetic and his recent A1c last month was 7.6. He admits to chronic bilateral lower extremity pain, no change in pain since blister and ulceration development. He is currently being treated with Levaquin for bronchitis. He has home health and we will continue. 10/25/17-He is here in follow-up evaluation for bilateral lower extremity ulcerationssubtle he remains on Levaquin for bronchitis. Right lower extremity with no evidence of drainage or ulceration, persistent left lower extremity ulceration. He states that home health has not been out since his appointment. He went to [facility] on Tuesday, studies revealed: RIGHT ABI 0.9, TBI 0.6 LEFT ABI 1.1, TBI 0.6 with triphasic flow bilaterally. We will continue his same treatment plan. He has been educated on compression therapy and need for elevation. He will benefit from lymphedema pumps 11/01/17-He is here in follow-up evaluation for left lower extremity ulcer. The right lower extremity remains healed. He has home health services, but they have not been out to see the patient for 2-3 weeks. He states it home health physical therapy changed his dressing yesterday after therapy; he placed Ace wrap compression. We are still waiting for lymphedema pumps, reordered d/t need for company change. 11/08/17-He is here in follow-up evaluation for left lower extremity ulcer. It is improved. Edema is significantly improved with compression therapy. We will continue with same treatment plan and he will follow-up next week. No word regarding lymphedema pumps 11/15/17-He is here in follow-up evaluation for left lower extremity ulcer. He is healed and will be discharged from wound care services. I have reached out to [facility] regarding his lymphedema pumps. They have been unable to reach the patient; the contact number they had with the patient's wife's cell phone and she has not answered any unrecognized  calls. Contact should be made today, trial planned for next week; [facility] will continue to  follow 11/27/17 on evaluation today patient has multiple blistered areas over the right lower extremity his left lower extremity appears to be doing okay. These blistered areas show signs of no infection which is great news. With that being said he did have some necrotic skin overlying which was mechanically debrided away with saline and gauze today without complication. Overall post debridement the wounds appear to be doing better but in general his swelling seems to be increased. This is obviously not good news. I think this is what has given rise to the blisters. 12/04/17 on evaluation today patient presents for follow-up concerning his bilateral lower extremity edema in the right lower extremity ulcers. He has been tolerating the dressing changes without complication. With that being said he has had no real issues with the wraps which is also good news. Overall I'm pleased with the progress he's been making. 12/11/17 on evaluation today patient appears to be doing rather well in regard to his right lateral lower extremity ulcer. He's been tolerating the dressing changes without complication. Fortunately there does not appear to be any evidence of infection at this time. Overall I'm pleased with the progress that is being made. Unfortunately he has been in the hospital due to having what sounds to be a stomach virus/flu fortunately that is starting to get better. 12/18/17 on evaluation today patient actually appears to be doing very well in regard to his bilateral lower extremities the swelling is under fairly good control his lymphedema pumps are still not up and running quite yet. With that being said he does have several areas of opening noted as far as wounds are concerned mainly over the left lower extremity. With that being said I do believe once he gets lymphedema pumps this would least help you  mention some the fluid and preventing this from occurring. Hopefully that will be set up soon sleeves are Artie in place at his home he just waiting for the machine. 12/25/17 on evaluation today patient actually appears to be doing excellent in fact all of his ulcers appear to have resolved his KARINA, LENDERMAN. ([id number]) legs appear very well. I do think he needs compression stockings we have discussed this and they are actually going to go to [facility] today to elastic therapy to get this fitted for him. I think that is definitely a good thing to do. Readmission: 04/09/18 upon evaluation today this patient is seen for readmission due to bilateral lower extremity lymphedema. He has significant swelling of his extremities especially on the left although the right is also swollen he has weeping from both sides. There are no obvious open wounds at this point. Fortunately he has been doing fairly well for quite a bit of time since I last saw him. Nonetheless unfortunately this seems to have reopened and is giving quite a bit of trouble. He states this began about a week ago when he first called [place] to get in to be seen. No fevers, chills, nausea, or vomiting noted at this time. He has not been using his lymphedema pumps due to the fact that they won't fit on his leg at this point likewise is also not been using his compression for essentially the same reason. 04/16/18 upon evaluation today patient actually appears to be doing a little better in regard to the fluid in his bilateral lower extremities. With that being said he's had three falls since I saw him last week. He also states that he's been feeling very poorly. I was  concerned last week and feel like that the concern is still there as far as the congestion in his chest is concerned he seems to be breathing about the same as last week but again he states he's very weak he's not even able to walk further than from the chair to the door. His wife had  to buy a wheelchair just to be able to get them out of the house to get to the appointment today. This has me very concerned. 04/23/18 on evaluation today patient actually appears to be doing much better than last week's evaluation. At that time actually had to transport him to the ER via EMS and he subsequently was admitted for acute pulmonary edema, acute renal failure, and acute congestive heart failure. Fortunately he is doing much better. Apparently they did dialyze him and were able to take off roughly 35 pounds of fluid. Nonetheless he is feeling much better both in regard to his breathing and he's able to get around much better at this time compared to previous. Overall I'm very happy with how things are at this time. There does not appear to be any evidence of infection currently. No fevers, chills, nausea, or vomiting noted at this time. 04/30/2018 patient seen today for follow-up and management of bilateral lower extremity lymphedema. He did express being more sad today than usual due to the recent loss of his dog. He states that he has been compliant with using the lymphedema pumps. However he does admit a minute over the last 2-3 days he has not been using the pumps due to the recent loss of his dog. At this time there is no drainage or open wounds to his lower extremities. The left leg edema is measuring smaller today. Still has a significant amount of edema on bilateral lower extremities With dry flaky skin. He will be referred to the lymphedema clinic for further management. Will continue 3 layer compression wraps and follow-up in 1-2 weeks.Denies any pain, fever, chairs recently. No recent falls or injuries reported during this visit. 05/07/18 on evaluation today patient actually appears to be doing very well in regard to his lower extremities in general all things considered. With that being said he is having some pain in the legs just due to the amount of swelling. He does have an  area where he had a blister on the left lateral lower extremity this is open at this point other than that there's nothing else weeping at this time. 05/14/18 on evaluation today patient actually appears to be doing excellent all things considered in regard to his lower extremities. He still has a couple areas of weeping on each leg which has continued to be the issue for him. He does have an appointment with the lymphedema clinic although this isn't until February 2020. That was the earliest they had. In the meantime he has continued to tolerate the compression wraps without complication. 05/28/18 on evaluation today patient actually appears to be doing more poorly in regard to his left lower extremity where he has a wound open at this point. He also had a fall where he subsequently injured his right great toe which has led to an open wound at the site unfortunately. He has been tolerating the dressing changes without complication in general as far as the wraps are concerned that he has not been putting any dressing on the left 1st toe ulcer site. 06/11/18 on evaluation today patient appears to be doing much worse in regard to his  bilateral lower extremity ulcers. He has been tolerating the dressing changes without complication although his legs have not been wrapped more recently. Overall I am not very pleased with the way his legs appear. I do believe he needs to be back in compression wraps he still has not received his compression wraps from the [facility] hospital as of yet. 06/18/18 on evaluation today patient actually appears to be doing significantly better than last time I saw him. He has been tolerating the compression wraps without complication in the circumferential ulcers especially appear to be doing much better. His toe ulcer on the right in regard to the great toe is better although not as good as the legs in my pinion. No fevers chills noted Adam Merritt, Adam Merritt ([id number]) 07/02/18 on evaluation  today patient appears to be doing much better in regard to his lower extremity ulcers. Unfortunately since I last saw him he's had the distal portion of his right great toe if he dated it sounds as if this actually went downhill very quickly. I had only seen him a few days prior and the toe did not appear to be infected at that point subsequently became infected very rapidly and it was decided by the surgeon that the distal portion of the toe needed to be removed. The patient seems to be doing well in this regard he tells me. With that being said his lower extremities are doing better from the standpoint of the wounds although he is significantly swollen at this point. 07/09/18 on evaluation today patient appears to be doing better in regard to the wounds on his lower extremities. In fact everything is almost completely healed he is just a small area on the left posterior lower extremity that is open at this point. 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 With that being said he needs some kind of compression it does sound like he may not be wearing his compression, that is the wraps, during the entire time between when he's here visit to visit. Apparently his wife took the current one off because it began to "fall apart". 07/16/18 on evaluation today patient appears to be extremely swollen especially in regard to his left lower extremity unfortunately. He also has a new skin tear over the left lower extremity and there's a smaller area on the right lower extremity as well. Unfortunately this seems to be due in part to blistering and fluid buildup in his leg. He did get the reduction wraps that were ordered by the [facility] hospital for him to go to lymphedema clinic. With that being said his wounds on the legs have not healed  to the point to where they would likely accept them as a patient lymphedema clinic currently. We need to try to get this to heal. With that being said he's been taking his wraps off which is not doing him any favors at this point. In fact this is probably quite counterproductive compared to what needs to occur. We will likely need to increase to a four layer compression wrap and continue to also utilize elevation and he has to keep the wraps on not take them off as he's been doing currently hasn't had a wrap on since Saturday. 07/23/18 on evaluation today patient appears to be doing much better in regard to his bilateral lower extremities.  In fact his left lower extremity which was the largest is actually 15 cm smaller today compared to what it was last time he was here in our clinic. This is obviously good news after just one week. Nonetheless the differences he actually kept the wraps on during the entire week this time. That's not typical for him. I do believe he understands a little bit better now the severity of the situation and why it's important for him to keep these wraps on. 07/30/18 on evaluation today patient actually appears to be doing rather well in regard to his lower extremities. His legs are much smaller than they have been in the past and he actually has only one very small rudder superficial region remaining that is not closed on the left lateral/posterior lower extremity even this is almost completely close. I do believe likely next week he will be healed without any complications. I do think we need to continue the wraps however this seems to be beneficial for him. I also think it may be a good time for [place] to go ahead and see about getting the appointment with the lymphedema clinic which is supposed to be made for him in order to keep this moving along and hopefully get them into compression wraps that will in the end help him to remain healed. 08/06/18 on evaluation today patient  actually appears to be doing very well in regard as bilateral lower extremities. In fact his wounds appear to be completely healed at this time. 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 He is in the process of getting appointment with the lymphedema clinic we have made this referral were just waiting to hear back on the schedule time. We need to follow up on that today as well. 08/13/18 on evaluation today patient actually appears to be doing very well in regard to his bilateral lower extremities there are no open wounds at this point. We are gonna go ahead and see about ordering the Velcro compression wraps for him had a discussion with them about [place] doing it versus the [place] they feel like they can definitely afford going ahead and get the wraps themselves and they would prefer to try to avoid having to go to the lymphedema clinic if it all possible which I completely understand. As long as he has good compression I'm okay either way. 3/17//20 on evaluation today patient actually appears to be doing well in regard to his bilateral lower extremity ulcers. He has been tolerating the dressing changes without complication specifically the compression wraps. Overall is had no issues my fingers finding that I see at this point is that he is having trouble with constipation. He tells me he has not been able to go to the bathroom for about six days. He's taken to over-the-counter oral laxatives unfortunately this is not helping. He has not contacted his doctor. Electronic Signature(s) Signed: 08/20/2018 5:25:20 PM By: Worthy Keeler PA-C Entered By: Worthy Keeler on 08/20/2018 15:55:32 Adam Merritt ([id number]) MADISON, ALBEA ([id number]) -------------------------------------------------------------------------------- Physical Exam Details Patient Name: Adam Merritt Date of Service: 08/20/2018 2:15 PM Medical Record Number:  [id number] Patient Account Number: [id number] Date of Birth/Sex: [birth year] (73 y.o. M) Treating RN: Montey Hora Primary Care Provider: [name] Other Clinician: Referring Provider: [name] Treating Provider/Extender: STONE III, [name]: 40 Constitutional Well-nourished and well-hydrated in no acute distress. Respiratory normal breathing without difficulty. clear to auscultation bilaterally.  Cardiovascular regular rate and rhythm with normal S1, S2. 1+ pitting edema of the bilateral lower extremities. Psychiatric this patient is able to make decisions and demonstrates good insight into disease process. Alert and Oriented x 3. pleasant and cooperative. Notes Patient's wound bed currently again shows no ones of the bilateral lower extremities which is good news he does have lymphedema he still waiting on the compression stockings for the the bilateral lower extremities. With that being said I think until he gets these Velcro compression stockings he's gonna need to continue to have compression wraps or else everything is gonna reopen. Electronic Signature(s) Signed: 08/20/2018 5:25:20 PM By: Worthy Keeler PA-C Entered By: Worthy Keeler on 08/20/2018 15:56:32 Adam Merritt ([id number]) -------------------------------------------------------------------------------- Physician Orders Details Patient Name: Adam Merritt Date of Service: 08/20/2018 2:15 PM Medical Record Number: [id number] Patient Account Number: [id number] Date of Birth/Sex: 1945-08-19 (73 y.o. 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 Edema Control o 4 Layer Compression System - Bilateral Electronic Signature(s) Signed: 08/20/2018 4:44:55 PM By: Montey Hora Signed: 08/20/2018 5:25:20 PM By: Worthy Keeler PA-C Entered By: Montey Hora on 08/20/2018 15:35:01 Adam Merritt ([id number]) -------------------------------------------------------------------------------- Problem List Details Patient Name: Adam Merritt Date of Service: 08/20/2018 2:15 PM Medical Record Number: [id number] Patient Account Number: [id number] Date of Birth/Sex: Dec 09, 1945 (73 y.o. M) Treating RN: Montey Hora Primary Care Provider: [name] Other Clinician: Referring Provider: [name] Treating Provider/Extender: Melburn Hake, [name]: 21 Active Problems ICD-10 Evaluated Encounter Code Description Active Date Today Diagnosis I89.0 Lymphedema, not elsewhere classified 04/09/2018 No Yes L97.512 Non-pressure chronic ulcer of other part of right foot with fat 05/28/2018 No Yes layer exposed L97.822 Non-pressure chronic ulcer of other part of left lower leg with 05/28/2018 No Yes fat layer exposed L98.492 Non-pressure chronic ulcer of skin of other sites with fat layer 06/11/2018 No Yes exposed E66.9 Obesity, unspecified 04/09/2018 No Yes Inactive Problems Resolved Problems ICD-10 Code Description Active Date Resolved Date L03.115 Cellulitis of right lower limb 04/09/2018 04/09/2018 L03.116 Cellulitis of left lower limb 04/09/2018 04/09/2018 Electronic Signature(s) Signed: 08/20/2018 5:25:20 PM By: Worthy Keeler PA-C Entered By: Worthy Keeler on 08/20/2018 14:11:54 Adam Merritt ([id number]) Adam Merritt, Adam Merritt  ([id number]) -------------------------------------------------------------------------------- Progress Note Details Patient Name: Adam Merritt Date of Service: 08/20/2018 2:15 PM Medical Record Number: [id number] Patient Account Number: [id number] Date of Birth/Sex: 05-17-1946 (73 y.o. M) Treating RN: Montey Hora Primary Care Provider: [name] Other Clinician: Referring Provider: [name] Treating Provider/Extender: Melburn Hake, [name]: 72 Subjective Chief Complaint Information obtained from Patient he is here for evaluation of bilateral lower extremity lymphedema right right toe and left LE ulcers History of Present Illness (HPI) 10/18/17-He is here for initial evaluation of bilateral lower extremity ulcerations in the presence of venous insufficiency and lymphedema. He has been seen by vascular medicine in the past, Dr. Lucky Cowboy, last seen in 2016. He does have a history of abnormal ABIs, which  is to be expected given his lymphedema and venous insufficiency. According to Epic, it appears that all attempts for arterial evaluation and/or angiography were not follow through with by patient. He does have a history of being seen in lymphedema clinic in 2018, stopped going approximately 6 months ago stating "it didn't do any good". He does not have lymphedema pumps, he does not have custom fit compression wrap/stockings. He is diabetic and his recent A1c last month was 7.6. He admits to chronic bilateral lower extremity pain, no change in pain since blister and ulceration development. He is currently being treated with Levaquin for bronchitis. He has home health and we will continue. 10/25/17-He is here in follow-up evaluation for bilateral lower extremity ulcerationssubtle he remains on Levaquin for bronchitis. Right lower extremity with no evidence of drainage or ulceration, persistent left lower extremity ulceration. He states that home health has not been out since his  appointment. He went to [facility] on Tuesday, studies revealed: RIGHT ABI 0.9, TBI 0.6 LEFT ABI 1.1, TBI 0.6 with triphasic flow bilaterally. We will continue his same treatment plan. He has been educated on compression therapy and need for elevation. He will benefit from lymphedema pumps 11/01/17-He is here in follow-up evaluation for left lower extremity ulcer. The right lower extremity remains healed. He has home health services, but they have not been out to see the patient for 2-3 weeks. He states it home health physical therapy changed his dressing yesterday after therapy; he placed Ace wrap compression. We are still waiting for lymphedema pumps, reordered d/t need for company change. 11/08/17-He is here in follow-up evaluation for left lower extremity ulcer. It is improved. Edema is significantly improved with compression therapy. We will continue with same treatment plan and he will follow-up next week. No word regarding lymphedema pumps 11/15/17-He is here in follow-up evaluation for left lower extremity ulcer. He is healed and will be discharged from wound care services. I have reached out to [facility] regarding his lymphedema pumps. They have been unable to reach the patient; the contact number they had with the patient's wife's cell phone and she has not answered any unrecognized calls. Contact should be made today, trial planned for next week; [facility] will continue to follow 11/27/17 on evaluation today patient has multiple blistered areas over the right lower extremity his left lower extremity appears to be doing okay. These blistered areas show signs of no infection which is great news. With that being said he did have some necrotic skin overlying which was mechanically debrided away with saline and gauze today without complication. Overall post debridement the wounds appear to be doing better but in general his swelling seems to be increased. This  is obviously not good news. I think this is what has given rise to the blisters. 12/04/17 on evaluation today patient presents for follow-up concerning his bilateral lower extremity edema in the right lower extremity ulcers. He has been tolerating the dressing changes without complication. With that being said he has had no real issues with the wraps which is also good news. Overall I'm pleased with the progress he's been making. 12/11/17 on evaluation today patient appears to be doing rather well in regard to his right lateral lower extremity ulcer. He's been tolerating the dressing changes without complication. Fortunately there does not appear to be any evidence of infection at this time. Overall I'm pleased with the progress that is being made. Unfortunately he has been in the hospital due  to having what sounds to be a stomach virus/flu fortunately that is starting to get better. 12/18/17 on evaluation today patient actually appears to be doing very well in regard to his bilateral lower extremities the Adam Merritt, Adam Merritt. ([id number]) swelling is under fairly good control his lymphedema pumps are still not up and running quite yet. With that being said he does have several areas of opening noted as far as wounds are concerned mainly over the left lower extremity. With that being said I do believe once he gets lymphedema pumps this would least help you mention some the fluid and preventing this from occurring. Hopefully that will be set up soon sleeves are Artie in place at his home he just waiting for the machine. 12/25/17 on evaluation today patient actually appears to be doing excellent in fact all of his ulcers appear to have resolved his legs appear very well. I do think he needs compression stockings we have discussed this and they are actually going to go to [facility] today to elastic therapy to get this fitted for him. I think that is definitely a good thing to do. Readmission: 04/09/18 upon  evaluation today this patient is seen for readmission due to bilateral lower extremity lymphedema. He has significant swelling of his extremities especially on the left although the right is also swollen he has weeping from both sides. There are no obvious open wounds at this point. Fortunately he has been doing fairly well for quite a bit of time since I last saw him. Nonetheless unfortunately this seems to have reopened and is giving quite a bit of trouble. He states this began about a week ago when he first called [place] to get in to be seen. No fevers, chills, nausea, or vomiting noted at this time. He has not been using his lymphedema pumps due to the fact that they won't fit on his leg at this point likewise is also not been using his compression for essentially the same reason. 04/16/18 upon evaluation today patient actually appears to be doing a little better in regard to the fluid in his bilateral lower extremities. With that being said he's had three falls since I saw him last week. He also states that he's been feeling very poorly. I was concerned last week and feel like that the concern is still there as far as the congestion in his chest is concerned he seems to be breathing about the same as last week but again he states he's very weak he's not even able to walk further than from the chair to the door. His wife had to buy a wheelchair just to be able to get them out of the house to get to the appointment today. This has me very concerned. 04/23/18 on evaluation today patient actually appears to be doing much better than last week's evaluation. At that time actually had to transport him to the ER via EMS and he subsequently was admitted for acute pulmonary edema, acute renal failure, and acute congestive heart failure. Fortunately he is doing much better. Apparently they did dialyze him and were able to take off roughly 35 pounds of fluid. Nonetheless he is feeling much better both in regard  to his breathing and he's able to get around much better at this time compared to previous. Overall I'm very happy with how things are at this time. There does not appear to be any evidence of infection currently. No fevers, chills, nausea, or vomiting noted at this time. 04/30/2018  patient seen today for follow-up and management of bilateral lower extremity lymphedema. He did express being more sad today than usual due to the recent loss of his dog. He states that he has been compliant with using the lymphedema pumps. However he does admit a minute over the last 2-3 days he has not been using the pumps due to the recent loss of his dog. At this time there is no drainage or open wounds to his lower extremities. The left leg edema is measuring smaller today. Still has a significant amount of edema on bilateral lower extremities With dry flaky skin. He will be referred to the lymphedema clinic for further management. Will continue 3 layer compression wraps and follow-up in 1-2 weeks.Denies any pain, fever, chairs recently. No recent falls or injuries reported during this visit. 05/07/18 on evaluation today patient actually appears to be doing very well in regard to his lower extremities in general all things considered. With that being said he is having some pain in the legs just due to the amount of swelling. He does have an area where he had a blister on the left lateral lower extremity this is open at this point other than that there's nothing else weeping at this time. 05/14/18 on evaluation today patient actually appears to be doing excellent all things considered in regard to his lower extremities. He still has a couple areas of weeping on each leg which has continued to be the issue for him. He does have an appointment with the lymphedema clinic although this isn't until February 2020. That was the earliest they had. In the meantime he has continued to tolerate the compression wraps without  complication. 05/28/18 on evaluation today patient actually appears to be doing more poorly in regard to his left lower extremity where he has a wound open at this point. He also had a fall where he subsequently injured his right great toe which has led to an open wound at the site unfortunately. He has been tolerating the dressing changes without complication in general as far as the wraps are concerned that he has not been putting any dressing on the left 1st toe ulcer site. 06/11/18 on evaluation today patient appears to be doing much worse in regard to his bilateral lower extremity ulcers. He has been tolerating the dressing changes without complication although his legs have not been wrapped more recently. Overall I am not very pleased with the way his legs appear. I do believe he needs to be back in compression wraps he still has not received his compression wraps from the [facility] hospital as of yet. Adam Merritt, Adam Merritt ([id number]) 06/18/18 on evaluation today patient actually appears to be doing significantly better than last time I saw him. He has been tolerating the compression wraps without complication in the circumferential ulcers especially appear to be doing much better. His toe ulcer on the right in regard to the great toe is better although not as good as the legs in my pinion. No fevers chills noted 07/02/18 on evaluation today patient appears to be doing much better in regard to his lower extremity ulcers. Unfortunately since I last saw him he's had the distal portion of his right great toe if he dated it sounds as if this actually went downhill very quickly. I had only seen him a few days prior and the toe did not appear to be infected at that point subsequently became infected very rapidly and it was decided by the surgeon that  the distal portion of the toe needed to be removed. The patient seems to be doing well in this regard he tells me. With that being said his lower extremities are  doing better from the standpoint of the wounds although he is significantly swollen at this point. 07/09/18 on evaluation today patient appears to be doing better in regard to the wounds on his lower extremities. In fact everything is almost completely healed he is just a small area on the left posterior lower extremity that is open at this point. 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 With that being said he needs some kind of compression it does sound like he may not be wearing his compression, that is the wraps, during the entire time between when he's here visit to visit. Apparently his wife took the current one off because it began to "fall apart". 07/16/18 on evaluation today patient appears to be extremely swollen especially in regard to his left lower extremity unfortunately. He also has a new skin tear over the left lower extremity and there's a smaller area on the right lower extremity as well. Unfortunately this seems to be due in part to blistering and fluid buildup in his leg. He did get the reduction wraps that were ordered by the [facility] hospital for him to go to lymphedema clinic. With that being said his wounds on the legs have not healed to the point to where they would likely accept them as a patient lymphedema clinic currently. We need to try to get this to heal. With that being said he's been taking his wraps off which is not doing him any favors at this point. In fact this is probably quite counterproductive compared to what needs to occur. We will likely need to increase to a four layer compression wrap and continue to also utilize elevation and he has to keep the wraps on not take them off as he's been doing currently hasn't had a wrap on since Saturday. 07/23/18 on evaluation today patient appears  to be doing much better in regard to his bilateral lower extremities. In fact his left lower extremity which was the largest is actually 15 cm smaller today compared to what it was last time he was here in our clinic. This is obviously good news after just one week. Nonetheless the differences he actually kept the wraps on during the entire week this time. That's not typical for him. I do believe he understands a little bit better now the severity of the situation and why it's important for him to keep these wraps on. 07/30/18 on evaluation today patient actually appears to be doing rather well in regard to his lower extremities. His legs are much smaller than they have been in the past and he actually has only one very small rudder superficial region remaining that is not closed on the left lateral/posterior lower extremity even this is almost completely close. I do believe likely next week he will be healed without any complications. I do think we need to continue the wraps however this seems to be beneficial for him. I also think it may be a good time for [place] to go ahead and see about getting the appointment with the lymphedema clinic which is supposed to be made for him in order to keep this moving along  and hopefully get them into compression wraps that will in the end help him to remain healed. 08/06/18 on evaluation today patient actually appears to be doing very well in regard as bilateral lower extremities. In fact his wounds appear to be completely healed at this time. 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 He is in the process of getting appointment with the lymphedema clinic we have made this referral were just waiting to hear back on the schedule time. We need to follow up on that today as well. 08/13/18 on evaluation today patient actually appears to be doing very well in regard to his bilateral lower extremities there are no  open wounds at this point. We are gonna go ahead and see about ordering the Velcro compression wraps for him had a discussion with them about [place] doing it versus the [place] they feel like they can definitely afford going ahead and get the wraps themselves and they would prefer to try to avoid having to go to the lymphedema clinic if it all possible which I completely understand. As long as he has good compression I'm okay either way. 3/17//20 on evaluation today patient actually appears to be doing well in regard to his bilateral lower extremity ulcers. He has been tolerating the dressing changes without complication specifically the compression wraps. Overall is had no issues my fingers finding that I see at this point is that he is having trouble with constipation. He tells me he has not been able to go to the bathroom for about six days. He's taken to over-the-counter oral laxatives unfortunately this is not helping. He has not contacted his doctor. Adam Merritt, Adam Merritt ([id number]) Patient History Information obtained from Patient. Family History Cancer - Paternal Grandparents, Kidney Disease - Siblings, Lung Disease - Mother, No family history of Diabetes, Heart Disease, Hereditary Spherocytosis, Hypertension, Seizures, Stroke, Thyroid Problems, Tuberculosis. Social History Never smoker, Marital Status - Married, Alcohol Use - Never, Drug Use - No History, Caffeine Use - Never. Medical History Eyes Denies history of Cataracts, Glaucoma, Optic Neuritis Ear/Nose/Mouth/Throat Denies history of Chronic sinus problems/congestion, Middle ear problems Hematologic/Lymphatic Patient has history of Anemia - aplastic anemia, Lymphedema Denies history of Hemophilia, Human Immunodeficiency Virus, Sickle Cell Disease Respiratory Patient has history of Chronic Obstructive Pulmonary Disease (COPD), Sleep Apnea Denies history of Aspiration, Asthma, Pneumothorax, Tuberculosis Cardiovascular Patient has history  of Congestive Heart Failure, Coronary Artery Disease, Hypertension, Peripheral Venous Disease Denies history of Angina, Arrhythmia, Deep Vein Thrombosis, Hypotension, Myocardial Infarction, Peripheral Arterial Disease, Phlebitis, Vasculitis Gastrointestinal Denies history of Cirrhosis , Colitis, Crohn s, Hepatitis A, Hepatitis B, Hepatitis C Endocrine Patient has history of Type II Diabetes Denies history of Type I Diabetes Genitourinary Denies history of End Stage Renal Disease Immunological Denies history of Lupus Erythematosus, Raynaud s, Scleroderma Integumentary (Skin) Denies history of History of Burn, History of pressure wounds Musculoskeletal Patient has history of Osteoarthritis Denies history of Gout, Rheumatoid Arthritis, Osteomyelitis Neurologic Patient has history of Neuropathy Denies history of Dementia, Quadriplegia, Paraplegia, Seizure Disorder Oncologic Patient has history of Received Chemotherapy - last dose 2006 Denies history of Received Radiation Psychiatric Denies history of Anorexia/bulimia, Confinement Anxiety Hospitalization/Surgery History - 11/03/2013, [facility], bronchitis. Medical And Surgical History Notes Respiratory home O2 at night as needed Cardiovascular varicose veins Neurologic CVA - [address] Adam Merritt, Adam Merritt. ([id number]) Review of Systems (ROS) Constitutional Symptoms (General Health) Denies complaints or symptoms of Fever, Chills. Respiratory The patient has no complaints or  symptoms. Cardiovascular Complains or has symptoms of LE edema. Psychiatric The patient has no complaints or symptoms. Objective Constitutional Well-nourished and well-hydrated in no acute distress. Vitals Time Taken: 2:48 PM, Height: 66 in, Weight: 323 lbs, BMI: 52.1, Temperature: 98.5 F, Pulse: 85 bpm, Respiratory Rate: 16 breaths/min, Blood Pressure: 145/62 mmHg. Respiratory normal breathing without difficulty. clear to auscultation  bilaterally. Cardiovascular regular rate and rhythm with normal S1, S2. 1+ pitting edema of the bilateral lower extremities. Psychiatric this patient is able to make decisions and demonstrates good insight into disease process. Alert and Oriented x 3. pleasant and cooperative. General Notes: Patient's wound bed currently again shows no ones of the bilateral lower extremities which is good news he does have lymphedema he still waiting on the compression stockings for the the bilateral lower extremities. With that being said I think until he gets these Velcro compression stockings he's gonna need to continue to have compression wraps or else everything is gonna reopen. 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 ([id number]) Procedures There was a Four Layer Compression Therapy Procedure with a pre-treatment ABI of 1.1 by Montey Hora, RN. Post procedure Diagnosis Wound #: Same as Pre-Procedure Plan Follow-up Appointments: Return Appointment in 1 week. Edema Control: 4 Layer Compression System - Bilateral My suggestion currently is gonna be that we go ahead and continue with the above wound care measures for the next week. Patient is in agreement with plan. Fortunately there's no signs of infection at this point and no open wounds. I'm a recommend that we continue with the bilateral fuller compression wraps we did give him the number in order to have his wife Bethena Roys call about the Velcro compression wraps will see him back in one week to see were things stand. Please see above for specific wound care orders. We will see patient for re-evaluation in 1 week(s) here in the clinic. If anything  worsens or changes patient will contact our office for additional recommendations. Of note the patient unfortunately did have a fall this actually occurred right in front of my office anyway so I was able to see exactly what happened. With that being said he basically let the water get too far ahead of him trying to move too quickly leaning over too far forward. Subsequently as the walker slipped he held on right up and to the point that his hip touch the ground he really did not even strike the ground as much as he just kind of crumpled with the hip laying down first and then he slowly lowered the upper body down to the ground. Nonetheless his head did not strike anything there did not appear to be any bruises and he was not having any pain following this. We were able to get him up with myself as well as to the nurses back into a wheelchair and then we kept them in the wheelchair going forward. Nonetheless I do think that he seems to be doing okay again I did not see any evidence of injury and definitely there was no striking of his head to indicate anything that would end with a head trauma/leader other significant issues. Nonetheless I did time he needs to be very careful with his movement walking specifically he  needs to stay within his walker and not let it get too far ahead of him based on what I salt that seems to be the main issue. I do not want them to fall and break anything. We'll see were things stand at follow-up. Electronic Signature(s) Signed: 08/20/2018 5:25:20 PM By: Worthy Keeler PA-C Entered By: Worthy Keeler on 08/20/2018 15:58:48 Adam Merritt ([id number]) -------------------------------------------------------------------------------- ROS/PFSH Details Patient Name: Adam Merritt Date of Service: 08/20/2018 2:15 PM Medical Record Number: [id number] Patient Account Number: [id number] Date of Birth/Sex: 1945/06/16 (73 y.o. M) Treating RN: Montey Hora Primary Care  Provider: [name] Other Clinician: Referring Provider: [name] Treating Provider/Extender: Melburn Hake, [name]: 57 Information Obtained From Patient Wound History Do you currently have one or more open woundso No Have you tested positive for osteomyelitis (bone infection)o No Have you had any tests for circulation on your legso Yes Constitutional Symptoms (General Health) Complaints and Symptoms: Negative for: Fever; Chills Cardiovascular Complaints and Symptoms: Positive for: LE edema Medical History: Positive for: Congestive Heart Failure; Coronary Artery Disease; Hypertension; Peripheral Venous Disease Negative for: Angina; Arrhythmia; Deep Vein Thrombosis; Hypotension; Myocardial Infarction; Peripheral Arterial Disease; Phlebitis; Vasculitis Past Medical History Notes: varicose veins Eyes Medical History: Negative for: Cataracts; Glaucoma; Optic Neuritis Ear/Nose/Mouth/Throat Medical History: Negative for: Chronic sinus problems/congestion; Middle ear problems Hematologic/Lymphatic Medical History: Positive for: Anemia - aplastic anemia; Lymphedema Negative for: Hemophilia; Human Immunodeficiency Virus; Sickle Cell Disease Respiratory Complaints and Symptoms: No Complaints or Symptoms Medical History: Positive for: Chronic Obstructive Pulmonary Disease (COPD); Sleep Apnea Negative for: Aspiration; Asthma; Pneumothorax; Tuberculosis Past Medical History Notes: Adam Merritt, Adam Merritt ([id number]) home O2 at night as needed Gastrointestinal Medical History: Negative for: Cirrhosis ; Colitis; Crohnos; Hepatitis A; Hepatitis B; Hepatitis C Endocrine Medical History: Positive for: Type II Diabetes Negative for: Type I Diabetes Time with diabetes: since 2006 Treated with: Insulin Blood sugar tested every day: Yes Tested : Blood sugar testing results: Breakfast: 209 Genitourinary Medical History: Negative for: End Stage Renal  Disease Immunological Medical History: Negative for: Lupus Erythematosus; Raynaudos; Scleroderma Integumentary (Skin) Medical History: Negative for: History of Burn; History of pressure wounds Musculoskeletal Medical History: Positive for: Osteoarthritis Negative for: Gout; Rheumatoid Arthritis; Osteomyelitis Neurologic Medical History: Positive for: Neuropathy Negative for: Dementia; Quadriplegia; Paraplegia; Seizure Disorder Past Medical History Notes: CVA - 1992 Oncologic Medical History: Positive for: Received Chemotherapy - last dose 2006 Negative for: Received Radiation Psychiatric Complaints and Symptoms: No Complaints or Symptoms Medical History: Adam Merritt, Adam Merritt ([id number]) Negative for: Anorexia/bulimia; Confinement Anxiety Immunizations Pneumococcal Vaccine: Received Pneumococcal Vaccination: Yes Implantable Devices No devices added Hospitalization / Surgery History Name of Hospital Purpose of Hospitalization/Surgery Date [facility] bronchitis 11/03/2013 Family and Social History Cancer: Yes - Paternal Grandparents; Diabetes: No; Heart Disease: No; Hereditary Spherocytosis: No; Hypertension: No; Kidney Disease: Yes - Siblings; Lung Disease: Yes - Mother; Seizures: No; Stroke: No; Thyroid Problems: No; Tuberculosis: No; Never smoker; Marital Status - Married; Alcohol Use: Never; Drug Use: No History; Caffeine Use: Never; Financial Concerns: No; Food, Clothing or Shelter Needs: No; Support System Lacking: No; Transportation Concerns: No; Advanced Directives: No; Patient does not want information on Advanced Directives Physician Affirmation I have reviewed and agree with the above information. Electronic Signature(s) Signed: 08/20/2018 4:44:55 PM By: Montey Hora Signed: 08/20/2018 5:25:20 PM By: Worthy Keeler PA-C Entered By: Worthy Keeler on 08/20/2018 15:55:48 Adam Merritt  ([id number]) -------------------------------------------------------------------------------- SuperBill Details Patient Name: Adam Merritt Date of Service: 08/20/2018 Medical  Record Number: [id number] Patient Account Number: [id number] Date of Birth/Sex: 1946-03-06 (73 y.o. 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

## 2018-08-21 NOTE — Progress Notes (Signed)
LOWERY, PAULLIN ([id number]) Visit Report for 08/20/2018 Arrival Information Details Patient Name: Adam Merritt, Adam Merritt Date of Service: 08/20/2018 2:15 PM Medical Record Number: [id number] Patient Account Number: [id number] Date of Birth/Sex: 19-Jan-1946 (73 y.o. M) Treating RN: Army Melia Primary Care Dijuan Sleeth: [name] Other Clinician: Referring Chia Mowers: [name] Treating Jola Critzer/Extender: Melburn Hake, [name]: 10 Visit Information History Since Last Visit Added or deleted any medications: No Patient Arrived: Walker Any new allergies or adverse reactions: No Arrival Time: 14:47 Had a fall or experienced change in No Accompanied By: wife activities of daily living that may affect Transfer Assistance: None risk of falls: Patient Has Alerts: Yes Signs or symptoms of abuse/neglect since last visito No Patient Alerts: Patient on Blood Thinner Hospitalized since last visit: No Aspirin 325mg  Implantable device outside of the clinic excluding No Type II Diabetes cellular tissue based products placed in the center ABI 5/19 L 1.1 R 0.9 since last visit: TBI L 0.6 R 0.6 Has Dressing in Place as Prescribed: Yes Pain Present Now: No Electronic Signature(s) Signed: 08/20/2018 3:57:00 PM By: Army Melia Entered By: Army Melia on 08/20/2018 14:47:58 Adam Merritt ([id number]) -------------------------------------------------------------------------------- Compression Therapy Details Patient Name: Adam Merritt Date of Service: 08/20/2018 2:15 PM Medical Record Number: [id number] Patient Account Number: [id number] Date of Birth/Sex: 1945/07/26 (73 y.o. M) Treating RN: Montey Hora Primary Care Susie Ehresman: [name] Other Clinician: Referring Layn Kye: [name] Treating Demetress Tift/Extender: STONE III, [name]: 19 Compression Therapy Performed for Wound Assessment: NonWound Condition Lymphedema - Bilateral Leg Performed By: Clinician Montey Hora, RN Compression Type: Four Layer Pre Treatment ABI: 1.1 Post Procedure Diagnosis Same as Pre-procedure Electronic Signature(s) Signed: 08/20/2018 4:44:55 PM By: Montey Hora Entered By: Montey Hora on 08/20/2018 15:43:39 Adam Merritt ([id number]) -------------------------------------------------------------------------------- Lower Extremity Assessment Details Patient Name: Adam Merritt Date of Service: 08/20/2018 2:15 PM Medical Record Number: [id number] Patient Account Number: [id number] Date of Birth/Sex: 12-Sep-1945 (73 y.o. M) Treating RN: Army Melia Primary Care Jawad Wiacek: [name] Other Clinician: Referring Orian Figueira: [name] Treating Keyara Ent/Extender: Melburn Hake, [name]: 19 Edema Assessment Assessed: [Left: No] [Right: No] Edema: [Left: Yes] [Right: Yes] Calf Left: Right: Point of Measurement: 37 cm From Medial Instep 57 cm 51 cm Ankle Left: Right: Point of Measurement: 12 cm From Medial Instep 42 cm 37 cm Vascular Assessment Pulses: Dorsalis Pedis Palpable: [Left:Yes] [Right:Yes] Posterior Tibial Extremity colors, hair growth, and conditions: Extremity Color: [Left:Hyperpigmented] [Right:Hyperpigmented] Hair Growth on Extremity: [Left:No] [Right:No] Temperature of Extremity: [Left:Warm] [Right:Warm] Capillary Refill: [Left:< 3 seconds] [Right:< 3 seconds] Toe Nail Assessment Left: Right: Thick: Yes Yes Discolored: Yes Yes Deformed: No No Improper Length and Hygiene: No No Electronic Signature(s) Signed: 08/20/2018 3:57:00 PM By: Army Melia Entered By: Army Melia on 08/20/2018 14:57:49 Adam Merritt ([id number]) -------------------------------------------------------------------------------- Multi Wound Chart Details Patient Name: Adam Merritt Date of Service: 08/20/2018 2:15 PM Medical Record Number: [id number] Patient Account Number: [id number] Date of Birth/Sex: 21-Jun-1945 (73 y.o. M) Treating RN: Montey Hora Primary Care Eliberto Sole: [name] Other Clinician: Referring Chandrika Sandles: [name] Treating [place] Agent/Extender: Melburn Hake, [name]: 19 Vital Signs Height(in): 66 Pulse(bpm): 85 Weight(lbs): 323 Blood Pressure(mmHg): 145/62 Body Mass Index(BMI): 52 Temperature(F): 98.5 Respiratory Rate 16 (breaths/min): Wound Assessments Treatment Notes Electronic Signature(s) Signed: 08/20/2018 4:44:55 PM By: Montey Hora Entered By: Montey Hora on 08/20/2018 15:34:12 Adam Merritt ([id number]) -------------------------------------------------------------------------------- [place] Details Patient Name: Adam Merritt. Date of Service: 08/20/2018 2:15 PM  Medical Record Number: [id number] Patient Account Number: [id number] Date of Birth/Sex: [birth year] (73 y.o. M) Treating RN: Montey Hora Primary Care Vella Colquitt: [name] Other Clinician: Referring Ta Fair: [name] Treating Girl Schissler/Extender: Melburn Hake, [name]: 47 Active Inactive Abuse / Safety / Falls / Self Care Management Nursing Diagnoses: Potential for falls Goals: Patient will remain injury free related to falls Date Initiated: 04/09/2018 Target Resolution Date: 07/13/2018 Goal Status: Active Interventions: Assess fall risk on admission and as needed Notes: Orientation to the Wound Care Program Nursing Diagnoses: Knowledge deficit related to the wound healing center program Goals: Patient/caregiver will verbalize understanding of the Comfort Program Date Initiated: 04/09/2018 Target Resolution Date: 07/13/2018 Goal Status: Active Interventions: Provide education on orientation to the wound center Notes: Electronic Signature(s) Signed: 08/20/2018 4:44:55 PM By: Montey Hora Entered By: Montey Hora on 08/20/2018 15:33:58 Adam Merritt ([id number]) -------------------------------------------------------------------------------- Pain  Assessment Details Patient Name: Adam Merritt Date of Service: 08/20/2018 2:15 PM Medical Record Number: [id number] Patient Account Number: [id number] Date of Birth/Sex: November 03, 1945 (73 y.o. M) Treating RN: Army Melia Primary Care Joelie Schou: [name] Other Clinician: Referring Nayomi Tabron: [name] Treating Khaled Herda/Extender: Melburn Hake, [name]: 2 Active Problems Location of Pain Severity and Description of Pain Patient Has Paino No Site Locations Pain Management and Medication Current Pain Management: Electronic Signature(s) Signed: 08/20/2018 3:57:00 PM By: Army Melia Entered By: Army Melia on 08/20/2018 14:48:03 Adam Merritt ([id number]) -------------------------------------------------------------------------------- Patient/Caregiver Education Details Patient Name: Adam Merritt Date of Service: 08/20/2018 2:15 PM Medical Record Number: [id number] Patient Account Number: [id number] Date of Birth/Gender: 1945/10/26 (73 y.o. M) Treating RN: Montey Hora Primary Care Physician: [name] Other Clinician: Referring Physician: [name] Treating Physician/Extender: Sharalyn Ink in Treatment: 68 Education Assessment Education Provided To: Patient and Caregiver Education Topics Provided Venous: Handouts: Other: cal about velcro wraps Methods: Explain/Verbal, Printed Responses: State content correctly Electronic Signature(s) Signed: 08/20/2018 4:44:55 PM By: Montey Hora Entered By: Montey Hora on 08/20/2018 15:44:11 Adam Merritt ([id number]) -------------------------------------------------------------------------------- [place] Details Patient Name: Adam Merritt Date of Service: 08/20/2018 2:15 PM Medical Record Number: [id number] Patient Account Number: [id number] Date of Birth/Sex: Jan 04, 1946 (73 y.o. M) Treating RN: Army Melia Primary Care Taiden Raybourn: [name] Other Clinician: Referring Jubal Rademaker: [name] Treating  Mabel Roll/Extender: Melburn Hake, [name]: 19 Vital Signs Time Taken: 14:48 Temperature (F): 98.5 Height (in): 66 Pulse (bpm): 85 Weight (lbs): 323 Respiratory Rate (breaths/min): 16 Body Mass Index (BMI): 52.1 Blood Pressure (mmHg): 145/62 Reference Range: 80 - 120 mg / dl Electronic Signature(s) Signed: 08/20/2018 3:57:00 PM By: Army Melia Entered By: Army Melia on 08/20/2018 14:48:21

## 2018-08-23 ENCOUNTER — Other Ambulatory Visit: Payer: Self-pay

## 2018-08-23 ENCOUNTER — Other Ambulatory Visit: Payer: Self-pay | Admitting: Adult Health

## 2018-08-23 DIAGNOSIS — E11621 Type 2 diabetes mellitus with foot ulcer: Secondary | ICD-10-CM | POA: Diagnosis not present

## 2018-08-23 DIAGNOSIS — E1165 Type 2 diabetes mellitus with hyperglycemia: Secondary | ICD-10-CM

## 2018-08-26 ENCOUNTER — Encounter: Payer: Medicare Other | Admitting: Occupational Therapy

## 2018-08-27 ENCOUNTER — Encounter: Payer: Medicare Other | Admitting: Physician Assistant

## 2018-08-27 ENCOUNTER — Other Ambulatory Visit: Payer: Self-pay

## 2018-08-27 DIAGNOSIS — E11621 Type 2 diabetes mellitus with foot ulcer: Secondary | ICD-10-CM | POA: Diagnosis not present

## 2018-08-27 NOTE — Progress Notes (Signed)
BERRY, GODSEY ([id number]) Visit Report for 08/23/2018 Arrival Information Details Patient Name: Adam Merritt, Adam Merritt Date of Service: 08/23/2018 11:00 AM Medical Record Number: [id number] Patient Account Number: [id number] Date of Birth/Sex: [birth year] (73 y.o. M) Treating RN: Harold Barban Primary Care Nolin Adam Merritt: [name] Other Clinician: Referring Torryn Fiske: [name] Treating Maddyson Keil/Extender: Melburn Hake, [name]: 55 Visit Information History Since Last Visit Added or deleted any medications: No Patient Arrived: Ambulatory Any new allergies or adverse reactions: No Arrival Time: 11:03 Had a fall or experienced change in No Accompanied By: wife activities of daily living that may affect Transfer Assistance: EasyPivot Patient Lift risk of falls: Patient Identification Verified: Yes Signs or symptoms of abuse/neglect since last visito No Secondary Verification Process Yes Hospitalized since last visit: No Completed: Has Dressing in Place as Prescribed: Yes Patient Has Alerts: Yes Has Compression in Place as Prescribed: Yes Patient Alerts: Patient on Blood Pain Present Now: No Thinner Aspirin 325mg  Type II Diabetes ABI 5/19 L 1.1 R 0.9 TBI L 0.6 R 0.6 Electronic Signature(s) Signed: 08/27/2018 9:04:30 AM By: Harold Barban Entered By: Harold Barban on 08/23/2018 11:04:38 Adam Merritt ([id number]) -------------------------------------------------------------------------------- Clinic Level of Care Assessment Details Patient Name: Adam Merritt Date of Service: 08/23/2018 11:00 AM Medical Record Number: [id number] Patient Account Number: [id number] Date of Birth/Sex: 05-18-1946 (73 y.o. M) Treating RN: Harold Barban Primary Care Diedre Maclellan: [name] Other Clinician: Referring Emet Rafanan: [name] Treating Megyn Leng/Extender: Melburn Hake, [name]: 19 Clinic Level of Care Assessment Items TOOL 4 Quantity Score []  - Use when only an  EandM is performed on FOLLOW-UP visit 0 ASSESSMENTS - Nursing Assessment / Reassessment X - Reassessment of Co-morbidities (includes updates in patient status) 1 10 X- 1 5 Reassessment of Adherence to Treatment Plan ASSESSMENTS - Wound and Skin Assessment / Reassessment X - Simple Wound Assessment / Reassessment - one wound 1 5 []  - 0 Complex Wound Assessment / Reassessment - multiple wounds []  - 0 Dermatologic / Skin Assessment (not related to wound area) ASSESSMENTS - Focused Assessment []  - Circumferential Edema Measurements - multi extremities 0 []  - 0 Nutritional Assessment / Counseling / Intervention []  - 0 Lower Extremity Assessment (monofilament, tuning fork, pulses) []  - 0 Peripheral Arterial Disease Assessment (using hand held doppler) ASSESSMENTS - Ostomy and/or Continence Assessment and Care []  - Incontinence Assessment and Management 0 []  - 0 Ostomy Care Assessment and Management (repouching, etc.) PROCESS - Coordination of Care X - Simple Patient / Family Education for ongoing care 1 15 []  - 0 Complex (extensive) Patient / Family Education for ongoing care []  - 0 Staff obtains Programmer, systems, Records, Test Results / Process Orders []  - 0 Staff telephones HHA, Nursing Homes / Clarify orders / etc []  - 0 Routine Transfer to another Facility (non-emergent condition) []  - 0 Routine Hospital Admission (non-emergent condition) []  - 0 New Admissions / Biomedical engineer / Ordering NPWT, Apligraf, etc. []  - 0 Emergency Hospital Admission (emergent condition) X- 1 10 Simple Discharge Coordination Adam Merritt, Adam Merritt ([id number]) []  - 0 Complex (extensive) Discharge Coordination PROCESS - Special Needs []  - Pediatric / Minor Patient Management 0 []  - 0 Isolation Patient Management []  - 0 Hearing / Language / Visual special needs []  - 0 Assessment of Community assistance (transportation, D/C planning, etc.) []  - 0 Additional assistance / Altered mentation []  -  0 Support Surface(s) Assessment (bed, cushion, seat, etc.) INTERVENTIONS - Wound Cleansing / Measurement X - Simple Wound Cleansing - one wound  1 5 []  - 0 Complex Wound Cleansing - multiple wounds []  - 0 Wound Imaging (photographs - any number of wounds) []  - 0 Wound Tracing (instead of photographs) []  - 0 Simple Wound Measurement - one wound []  - 0 Complex Wound Measurement - multiple wounds INTERVENTIONS - Wound Dressings X - Small Wound Dressing one or multiple wounds 1 10 []  - 0 Medium Wound Dressing one or multiple wounds []  - 0 Large Wound Dressing one or multiple wounds []  - 0 Application of Medications - topical []  - 0 Application of Medications - injection INTERVENTIONS - Miscellaneous []  - External ear exam 0 []  - 0 Specimen Collection (cultures, biopsies, blood, body fluids, etc.) []  - 0 Specimen(s) / Culture(s) sent or taken to Lab for analysis []  - 0 Patient Transfer (multiple staff / Harrel Lemon Lift / Similar devices) []  - 0 Simple Staple / Suture removal (25 or less) []  - 0 Complex Staple / Suture removal (26 or more) []  - 0 Hypo / Hyperglycemic Management (close monitor of Blood Glucose) []  - 0 Ankle / Brachial Index (ABI) - do not check if billed separately X- 1 5 Vital Signs Adam Merritt, Adam Merritt ([id number]) Has the patient been seen at the hospital within the last three years: Yes Total Score: 65 Level Of Care: New/Established - Level 2 Electronic Signature(s) Signed: 08/27/2018 9:04:30 AM By: Harold Barban Entered By: Harold Barban on 08/23/2018 11:33:47 Adam Merritt ([id number]) -------------------------------------------------------------------------------- Compression Therapy Details Patient Name: Adam Merritt Date of Service: 08/23/2018 11:00 AM Medical Record Number: [id number] Patient Account Number: [id number] Date of Birth/Sex: 01-22-1946 (73 y.o. M) Treating RN: Harold Barban Primary Care Ralyn Stlaurent: [name] Other  Clinician: Referring Marijo Quizon: [name] Treating Esmond Hinch/Extender: STONE III, [name]: 19 Compression Therapy Performed for Wound Assessment: NonWound Condition Lymphedema - Bilateral Leg Performed By: Clinician Harold Barban, RN Compression Type: Four Layer Electronic Signature(s) Signed: 08/27/2018 9:04:30 AM By: Harold Barban Entered By: Harold Barban on 08/23/2018 11:05:12 Adam Merritt ([id number]) -------------------------------------------------------------------------------- Encounter Discharge Information Details Patient Name: Adam Merritt Date of Service: 08/23/2018 11:00 AM Medical Record Number: [id number] Patient Account Number: [id number] Date of Birth/Sex: November 25, 1945 (73 y.o. M) Treating RN: Harold Barban Primary Care Ceclia Koker: [name] Other Clinician: Referring Myishia Kasik: [name] Treating Ariatna Jester/Extender: Melburn Hake, [name]: 24 Encounter Discharge Information Items Discharge Condition: Stable Ambulatory Status: Wheelchair Discharge Destination: Home Transportation: Private Auto Accompanied By: wife Schedule Follow-up Appointment: Yes Clinical Summary of Care: Electronic Signature(s) Signed: 08/27/2018 9:04:30 AM By: Harold Barban Entered By: Harold Barban on 08/23/2018 11:33:03 Adam Merritt ([id number]) -------------------------------------------------------------------------------- Patient/Caregiver Education Details Patient Name: Adam Merritt Date of Service: 08/23/2018 11:00 AM Medical Record Number: [id number] Patient Account Number: [id number] Date of Birth/Gender: [birth year] (73 y.o. M) Treating RN: Harold Barban Primary Care Physician: [name] Other Clinician: Referring Physician: [name] Treating Physician/Extender: Sharalyn Ink in Treatment: 29 Education Assessment Education Provided To: Patient Education Topics Provided Wound/Skin Impairment: Handouts: Caring for  Your Ulcer Methods: Demonstration, Explain/Verbal Responses: State content correctly Electronic Signature(s) Signed: 08/27/2018 9:04:30 AM By: Harold Barban Entered By: Harold Barban on 08/23/2018 11:32:41

## 2018-08-29 NOTE — Progress Notes (Signed)
AMIERE, CAWLEY ([id number]) Visit Report for 08/27/2018 Chief Complaint Document Details Patient Name: Adam Merritt, Adam Merritt Date of Service: 08/27/2018 10:00 AM Medical Record Number: [id number] Patient Account Number: [id number] Date of Birth/Sex: May 26, 1946 (73 y.o. M) Treating RN: Montey Hora Primary Care Provider: [name] Other Clinician: Referring Provider: [name] Treating Provider/Extender: Melburn Hake, [name]: 20 Information Obtained from: Patient Chief Complaint he is here for evaluation of bilateral lower extremity lymphedema right right toe and left LE ulcers Electronic Signature(s) Signed: 08/28/2018 11:01:46 PM By: Worthy Keeler PA-C Entered By: Worthy Keeler on 08/27/2018 09:51:26 Adam Merritt ([id number]) -------------------------------------------------------------------------------- HPI Details Patient Name: Adam Merritt Date of Service: 08/27/2018 10:00 AM Medical Record Number: [id number] Patient Account Number: [id number] Date of Birth/Sex: [birth year] (73 y.o. M) Treating RN: Montey Hora Primary Care Provider: [name] Other Clinician: Referring Provider: [name] Treating Provider/Extender: Melburn Hake, [name]: 20 History of Present Illness HPI Description: 10/18/17-He is here for initial evaluation of bilateral lower extremity ulcerations in the presence of venous insufficiency and lymphedema. He has been seen by vascular medicine in the past, Dr. Lucky Cowboy, last seen in 2016. He does have a history of abnormal ABIs, which is to be expected given his lymphedema and venous insufficiency. According to Epic, it appears that all attempts for arterial evaluation and/or angiography were not follow through with by patient. He does have a history of being seen in lymphedema clinic in 2018, stopped going approximately 6 months ago stating "it didn't do any good". He does not have lymphedema pumps, he does not have custom fit  compression wrap/stockings. He is diabetic and his recent A1c last month was 7.6. He admits to chronic bilateral lower extremity pain, no change in pain since blister and ulceration development. He is currently being treated with Levaquin for bronchitis. He has home health and we will continue. 10/25/17-He is here in follow-up evaluation for bilateral lower extremity ulcerationssubtle he remains on Levaquin for bronchitis. Right lower extremity with no evidence of drainage or ulceration, persistent left lower extremity ulceration. He states that home health has not been out since his appointment. He went to [facility] on Tuesday, studies revealed: RIGHT ABI 0.9, TBI 0.6 LEFT ABI 1.1, TBI 0.6 with triphasic flow bilaterally. We will continue his same treatment plan. He has been educated on compression therapy and need for elevation. He will benefit from lymphedema pumps 11/01/17-He is here in follow-up evaluation for left lower extremity ulcer. The right lower extremity remains healed. He has home health services, but they have not been out to see the patient for 2-3 weeks. He states it home health physical therapy changed his dressing yesterday after therapy; he placed Ace wrap compression. We are still waiting for lymphedema pumps, reordered d/t need for company change. 11/08/17-He is here in follow-up evaluation for left lower extremity ulcer. It is improved. Edema is significantly improved with compression therapy. We will continue with same treatment plan and he will follow-up next week. No word regarding lymphedema pumps 11/15/17-He is here in follow-up evaluation for left lower extremity ulcer. He is healed and will be discharged from wound care services. I have reached out to [facility] regarding his lymphedema pumps. They have been unable to reach the patient; the contact number they had with the patient's wife's cell phone and she has not answered any unrecognized  calls. Contact should be made today, trial planned for next week; [facility] will continue to  follow 11/27/17 on evaluation today patient has multiple blistered areas over the right lower extremity his left lower extremity appears to be doing okay. These blistered areas show signs of no infection which is great news. With that being said he did have some necrotic skin overlying which was mechanically debrided away with saline and gauze today without complication. Overall post debridement the wounds appear to be doing better but in general his swelling seems to be increased. This is obviously not good news. I think this is what has given rise to the blisters. 12/04/17 on evaluation today patient presents for follow-up concerning his bilateral lower extremity edema in the right lower extremity ulcers. He has been tolerating the dressing changes without complication. With that being said he has had no real issues with the wraps which is also good news. Overall I'm pleased with the progress he's been making. 12/11/17 on evaluation today patient appears to be doing rather well in regard to his right lateral lower extremity ulcer. He's been tolerating the dressing changes without complication. Fortunately there does not appear to be any evidence of infection at this time. Overall I'm pleased with the progress that is being made. Unfortunately he has been in the hospital due to having what sounds to be a stomach virus/flu fortunately that is starting to get better. 12/18/17 on evaluation today patient actually appears to be doing very well in regard to his bilateral lower extremities the swelling is under fairly good control his lymphedema pumps are still not up and running quite yet. With that being said he does have several areas of opening noted as far as wounds are concerned mainly over the left lower extremity. With that being said I do believe once he gets lymphedema pumps this would least help you  mention some the fluid and preventing this from occurring. Hopefully that will be set up soon sleeves are Artie in place at his home he just waiting for the machine. 12/25/17 on evaluation today patient actually appears to be doing excellent in fact all of his ulcers appear to have resolved his ERI, PLATTEN. ([id number]) legs appear very well. I do think he needs compression stockings we have discussed this and they are actually going to go to [place] today to elastic therapy to get this fitted for him. I think that is definitely a good thing to do. Readmission: 04/09/18 upon evaluation today this patient is seen for readmission due to bilateral lower extremity lymphedema. He has significant swelling of his extremities especially on the left although the right is also swollen he has weeping from both sides. There are no obvious open wounds at this point. Fortunately he has been doing fairly well for quite a bit of time since I last saw him. Nonetheless unfortunately this seems to have reopened and is giving quite a bit of trouble. He states this began about a week ago when he first called [place] to get in to be seen. No fevers, chills, nausea, or vomiting noted at this time. He has not been using his lymphedema pumps due to the fact that they won't fit on his leg at this point likewise is also not been using his compression for essentially the same reason. 04/16/18 upon evaluation today patient actually appears to be doing a little better in regard to the fluid in his bilateral lower extremities. With that being said he's had three falls since I saw him last week. He also states that he's been feeling very poorly. I was  concerned last week and feel like that the concern is still there as far as the congestion in his chest is concerned he seems to be breathing about the same as last week but again he states he's very weak he's not even able to walk further than from the chair to the door. His wife had  to buy a wheelchair just to be able to get them out of the house to get to the appointment today. This has me very concerned. 04/23/18 on evaluation today patient actually appears to be doing much better than last week's evaluation. At that time actually had to transport him to the ER via EMS and he subsequently was admitted for acute pulmonary edema, acute renal failure, and acute congestive heart failure. Fortunately he is doing much better. Apparently they did dialyze him and were able to take off roughly 35 pounds of fluid. Nonetheless he is feeling much better both in regard to his breathing and he's able to get around much better at this time compared to previous. Overall I'm very happy with how things are at this time. There does not appear to be any evidence of infection currently. No fevers, chills, nausea, or vomiting noted at this time. 04/30/2018 patient seen today for follow-up and management of bilateral lower extremity lymphedema. He did express being more sad today than usual due to the recent loss of his dog. He states that he has been compliant with using the lymphedema pumps. However he does admit a minute over the last 2-3 days he has not been using the pumps due to the recent loss of his dog. At this time there is no drainage or open wounds to his lower extremities. The left leg edema is measuring smaller today. Still has a significant amount of edema on bilateral lower extremities With dry flaky skin. He will be referred to the lymphedema clinic for further management. Will continue 3 layer compression wraps and follow-up in 1-2 weeks.Denies any pain, fever, chairs recently. No recent falls or injuries reported during this visit. 05/07/18 on evaluation today patient actually appears to be doing very well in regard to his lower extremities in general all things considered. With that being said he is having some pain in the legs just due to the amount of swelling. He does have an  area where he had a blister on the left lateral lower extremity this is open at this point other than that there's nothing else weeping at this time. 05/14/18 on evaluation today patient actually appears to be doing excellent all things considered in regard to his lower extremities. He still has a couple areas of weeping on each leg which has continued to be the issue for him. He does have an appointment with the lymphedema clinic although this isn't until February 2020. That was the earliest they had. In the meantime he has continued to tolerate the compression wraps without complication. 05/28/18 on evaluation today patient actually appears to be doing more poorly in regard to his left lower extremity where he has a wound open at this point. He also had a fall where he subsequently injured his right great toe which has led to an open wound at the site unfortunately. He has been tolerating the dressing changes without complication in general as far as the wraps are concerned that he has not been putting any dressing on the left 1st toe ulcer site. 06/11/18 on evaluation today patient appears to be doing much worse in regard to his  bilateral lower extremity ulcers. He has been tolerating the dressing changes without complication although his legs have not been wrapped more recently. Overall I am not very pleased with the way his legs appear. I do believe he needs to be back in compression wraps he still has not received his compression wraps from the [facility] hospital as of yet. 06/18/18 on evaluation today patient actually appears to be doing significantly better than last time I saw him. He has been tolerating the compression wraps without complication in the circumferential ulcers especially appear to be doing much better. His toe ulcer on the right in regard to the great toe is better although not as good as the legs in my pinion. No fevers chills noted Adam Merritt, Adam Merritt ([id number]) 07/02/18 on evaluation  today patient appears to be doing much better in regard to his lower extremity ulcers. Unfortunately since I last saw him he's had the distal portion of his right great toe if he dated it sounds as if this actually went downhill very quickly. I had only seen him a few days prior and the toe did not appear to be infected at that point subsequently became infected very rapidly and it was decided by the surgeon that the distal portion of the toe needed to be removed. The patient seems to be doing well in this regard he tells me. With that being said his lower extremities are doing better from the standpoint of the wounds although he is significantly swollen at this point. 07/09/18 on evaluation today patient appears to be doing better in regard to the wounds on his lower extremities. In fact everything is almost completely healed he is just a small area on the left posterior lower extremity that is open at this point. 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 With that being said he needs some kind of compression it does sound like he may not be wearing his compression, that is the wraps, during the entire time between when he's here visit to visit. Apparently his wife took the current one off because it began to "fall apart". 07/16/18 on evaluation today patient appears to be extremely swollen especially in regard to his left lower extremity unfortunately. He also has a new skin tear over the left lower extremity and there's a smaller area on the right lower extremity as well. Unfortunately this seems to be due in part to blistering and fluid buildup in his leg. He did get the reduction wraps that were ordered by the [facility] hospital for him to go to lymphedema clinic. With that being said his wounds on the legs have not healed  to the point to where they would likely accept them as a patient lymphedema clinic currently. We need to try to get this to heal. With that being said he's been taking his wraps off which is not doing him any favors at this point. In fact this is probably quite counterproductive compared to what needs to occur. We will likely need to increase to a four layer compression wrap and continue to also utilize elevation and he has to keep the wraps on not take them off as he's been doing currently hasn't had a wrap on since Saturday. 07/23/18 on evaluation today patient appears to be doing much better in regard to his bilateral lower extremities.  In fact his left lower extremity which was the largest is actually 15 cm smaller today compared to what it was last time he was here in our clinic. This is obviously good news after just one week. Nonetheless the differences he actually kept the wraps on during the entire week this time. That's not typical for him. I do believe he understands a little bit better now the severity of the situation and why it's important for him to keep these wraps on. 07/30/18 on evaluation today patient actually appears to be doing rather well in regard to his lower extremities. His legs are much smaller than they have been in the past and he actually has only one very small rudder superficial region remaining that is not closed on the left lateral/posterior lower extremity even this is almost completely close. I do believe likely next week he will be healed without any complications. I do think we need to continue the wraps however this seems to be beneficial for him. I also think it may be a good time for [place] to go ahead and see about getting the appointment with the lymphedema clinic which is supposed to be made for him in order to keep this moving along and hopefully get them into compression wraps that will in the end help him to remain healed. 08/06/18 on evaluation today patient  actually appears to be doing very well in regard as bilateral lower extremities. In fact his wounds appear to be completely healed at this time. 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 He is in the process of getting appointment with the lymphedema clinic we have made this referral were just waiting to hear back on the schedule time. We need to follow up on that today as well. 08/13/18 on evaluation today patient actually appears to be doing very well in regard to his bilateral lower extremities there are no open wounds at this point. We are gonna go ahead and see about ordering the Velcro compression wraps for him had a discussion with them about [place] doing it versus the [place] they feel like they can definitely afford going ahead and get the wraps themselves and they would prefer to try to avoid having to go to the lymphedema clinic if it all possible which I completely understand. As long as he has good compression I'm okay either way. 3/17//20 on evaluation today patient actually appears to be doing well in regard to his bilateral lower extremity ulcers. He has been tolerating the dressing changes without complication specifically the compression wraps. Overall is had no issues my fingers finding that I see at this point is that he is having trouble with constipation. He tells me he has not been able to go to the bathroom for about six days. He's taken to over-the-counter oral laxatives unfortunately this is not helping. He has not contacted his doctor. 08/27/18 on evaluation today patient appears to be doing fairly well in regard to his lower extremities at this point. There does not appear to be any new altars is swelling is very well controlled. We are still waiting for his Velcro compression wraps to arrive that should be sometime in the next week is Artie sent the check for these. MAUREEN, DUESING ([id number]) Electronic  Signature(s) Signed: 08/28/2018 11:01:46 PM By: Worthy Keeler PA-C Entered By: Worthy Keeler on 08/27/2018 10:52:20 Adam Merritt ([id number]) -------------------------------------------------------------------------------- Physical Exam Details Patient Name: QUINTIN, Adam Merritt. Date  of Service: 08/27/2018 10:00 AM Medical Record Number: [id number] Patient Account Number: [id number] Date of Birth/Sex: November 22, 1945 (73 y.o. M) Treating RN: Montey Hora Primary Care Provider: [name] Other Clinician: Referring Provider: [name] Treating Provider/Extender: STONE III, [name]: 62 Constitutional Well-nourished and well-hydrated in no acute distress. Respiratory normal breathing without difficulty. clear to auscultation bilaterally. Cardiovascular regular rate and rhythm with normal S1, S2. Psychiatric this patient is able to make decisions and demonstrates good insight into disease process. Alert and Oriented x 3. pleasant and cooperative. Notes Patient's wounds currently have completely resolved were mainly just managing his lymphedema at this point. Nonetheless performing the live wraps are keeping things under control and preventing new wounds from occurring. That is why we continue to wrap him until he gets his Velcro wraps. Electronic Signature(s) Signed: 08/28/2018 11:01:46 PM By: Worthy Keeler PA-C Entered By: Worthy Keeler on 08/27/2018 10:52:48 Adam Merritt ([id number]) -------------------------------------------------------------------------------- Physician Orders Details Patient Name: Adam Merritt Date of Service: 08/27/2018 10:00 AM Medical Record Number: [id number] Patient Account Number: [id number] Date of Birth/Sex: Dec 13, 1945 (73 y.o. 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 o Nurse Visit as needed - Friday 08/30/2018 Edema Control o 4 Layer Compression System - Bilateral Electronic Signature(s) Signed: 08/27/2018 5:06:46 PM By: Montey Hora Signed: 08/28/2018 11:01:46 PM By: Worthy Keeler PA-C Entered By: Montey Hora on 08/27/2018 10:21:30 Adam Merritt ([id number]) -------------------------------------------------------------------------------- Problem List Details Patient Name: Adam Merritt Date of Service: 08/27/2018 10:00 AM Medical Record Number: [id number] Patient Account Number: [id number] Date of Birth/Sex: 20-Oct-1945 (73 y.o. M) Treating RN: Montey Hora Primary Care Provider: [name] Other Clinician: Referring Provider: [name] Treating Provider/Extender: Melburn Hake, [name]: 20 Active Problems ICD-10 Evaluated Encounter Code Description Active Date Today Diagnosis I89.0 Lymphedema, not elsewhere classified 04/09/2018 No Yes L97.512 Non-pressure chronic ulcer of other part of right foot with fat 05/28/2018 No Yes layer exposed L97.822 Non-pressure chronic ulcer of other part of left lower leg with 05/28/2018 No Yes fat layer exposed L98.492 Non-pressure chronic ulcer of skin of other sites with fat layer 06/11/2018 No Yes exposed E66.9 Obesity, unspecified 04/09/2018 No Yes Inactive Problems Resolved Problems ICD-10 Code Description Active Date Resolved Date L03.115 Cellulitis of right lower limb 04/09/2018 04/09/2018 L03.116 Cellulitis of left lower limb 04/09/2018  04/09/2018 Electronic Signature(s) Signed: 08/28/2018 11:01:46 PM By: Worthy Keeler PA-C Entered By: Worthy Keeler on 08/27/2018 09:51:21 Adam Merritt ([id number]) Adam Merritt, Adam Merritt ([id number]) -------------------------------------------------------------------------------- Progress Note Details Patient Name: Adam Merritt Date of Service: 08/27/2018 10:00 AM Medical Record Number: [id number] Patient Account Number: [id number] Date of Birth/Sex: 19-Apr-1946 (73 y.o. M) Treating RN: Montey Hora Primary Care Provider: [name] Other Clinician: Referring Provider: [name] Treating Provider/Extender: Melburn Hake, [name]: 20 Subjective Chief Complaint Information obtained from Patient he is here for evaluation of bilateral lower extremity lymphedema right right toe and left LE ulcers History of Present Illness (HPI)  10/18/17-He is here for initial evaluation of bilateral lower extremity ulcerations in the presence of venous insufficiency and lymphedema. He has been seen by vascular medicine in the past, Dr. Lucky Cowboy, last seen in 2016. He does have a history of abnormal ABIs, which is to be expected given his lymphedema and venous insufficiency. According to Epic, it appears that all attempts for arterial evaluation and/or angiography were not follow through with by patient. He does have a history of being seen in lymphedema clinic in 2018, stopped going approximately 6 months ago stating "it didn't do any good". He does not have lymphedema pumps, he does not have custom fit compression wrap/stockings. He is diabetic and his recent A1c last month was 7.6. He admits to chronic bilateral lower extremity pain, no change in pain since blister and ulceration development. He is currently being treated with Levaquin for bronchitis. He has home health and we will continue. 10/25/17-He is here in follow-up evaluation for bilateral lower extremity ulcerationssubtle he remains on  Levaquin for bronchitis. Right lower extremity with no evidence of drainage or ulceration, persistent left lower extremity ulceration. He states that home health has not been out since his appointment. [facility], studies revealed: RIGHT ABI 0.9, TBI 0.6 LEFT ABI 1.1, TBI 0.6 with triphasic flow bilaterally. We will continue his same treatment plan. He has been educated on compression therapy and need for elevation. He will benefit from lymphedema pumps 11/01/17-He is here in follow-up evaluation for left lower extremity ulcer. The right lower extremity remains healed. He has home health services, but they have not been out to see the patient for 2-3 weeks. He states it home health physical therapy changed his dressing yesterday after therapy; he placed Ace wrap compression. We are still waiting for lymphedema pumps, reordered d/t need for company change. 11/08/17-He is here in follow-up evaluation for left lower extremity ulcer. It is improved. Edema is significantly improved with compression therapy. We will continue with same treatment plan and he will follow-up next week. No word regarding lymphedema pumps 11/15/17-He is here in follow-up evaluation for left lower extremity ulcer. He is healed and will be discharged from wound care services. I have reached out to [facility] regarding his lymphedema pumps. They have been unable to reach the patient; the contact number they had with the patient's wife's cell phone and she has not answered any unrecognized calls. Contact should be made today, trial planned for next week; [facility] will continue to follow 11/27/17 on evaluation today patient has multiple blistered areas over the right lower extremity his left lower extremity appears to be doing okay. These blistered areas show signs of no infection which is great news. With that being said he did have some necrotic skin overlying which was  mechanically debrided away with saline and gauze today without complication. Overall post debridement the wounds appear to be doing better but in general his swelling seems to be increased. This is obviously not good news. I think this is what has given rise to the blisters. 12/04/17 on evaluation today patient presents for follow-up concerning his bilateral lower extremity edema in the right lower extremity ulcers. He has been tolerating the dressing changes without complication. With that being said he has had no real issues with the wraps which is also good news. Overall I'm pleased with the progress he's been making. 12/11/17 on evaluation today patient appears to be doing rather well in regard to his right  lateral lower extremity ulcer. He's been tolerating the dressing changes without complication. Fortunately there does not appear to be any evidence of infection at this time. Overall I'm pleased with the progress that is being made. Unfortunately he has been in the hospital due to having what sounds to be a stomach virus/flu fortunately that is starting to get better. 12/18/17 on evaluation today patient actually appears to be doing very well in regard to his bilateral lower extremities the Adam Merritt, Adam Merritt. ([id number]) swelling is under fairly good control his lymphedema pumps are still not up and running quite yet. With that being said he does have several areas of opening noted as far as wounds are concerned mainly over the left lower extremity. With that being said I do believe once he gets lymphedema pumps this would least help you mention some the fluid and preventing this from occurring. Hopefully that will be set up soon sleeves are Artie in place at his home he just waiting for the machine. 12/25/17 on evaluation today patient actually appears to be doing excellent in fact all of his ulcers appear to have resolved his legs appear very well. I do think he needs compression stockings we have  discussed this and they are actually going to go to [place] today to elastic therapy to get this fitted for him. I think that is definitely a good thing to do. Readmission: 04/09/18 upon evaluation today this patient is seen for readmission due to bilateral lower extremity lymphedema. He has significant swelling of his extremities especially on the left although the right is also swollen he has weeping from both sides. There are no obvious open wounds at this point. Fortunately he has been doing fairly well for quite a bit of time since I last saw him. Nonetheless unfortunately this seems to have reopened and is giving quite a bit of trouble. He states this began about a week ago when he first called [place] to get in to be seen. No fevers, chills, nausea, or vomiting noted at this time. He has not been using his lymphedema pumps due to the fact that they won't fit on his leg at this point likewise is also not been using his compression for essentially the same reason. 04/16/18 upon evaluation today patient actually appears to be doing a little better in regard to the fluid in his bilateral lower extremities. With that being said he's had three falls since I saw him last week. He also states that he's been feeling very poorly. I was concerned last week and feel like that the concern is still there as far as the congestion in his chest is concerned he seems to be breathing about the same as last week but again he states he's very weak he's not even able to walk further than from the chair to the door. His wife had to buy a wheelchair just to be able to get them out of the house to get to the appointment today. This has me very concerned. 04/23/18 on evaluation today patient actually appears to be doing much better than last week's evaluation. At that time actually had to transport him to the ER via EMS and he subsequently was admitted for acute pulmonary edema, acute renal failure, and acute congestive  heart failure. Fortunately he is doing much better. Apparently they did dialyze him and were able to take off roughly 35 pounds of fluid. Nonetheless he is feeling much better both in regard to his breathing and he's able  to get around much better at this time compared to previous. Overall I'm very happy with how things are at this time. There does not appear to be any evidence of infection currently. No fevers, chills, nausea, or vomiting noted at this time. 04/30/2018 patient seen today for follow-up and management of bilateral lower extremity lymphedema. He did express being more sad today than usual due to the recent loss of his dog. He states that he has been compliant with using the lymphedema pumps. However he does admit a minute over the last 2-3 days he has not been using the pumps due to the recent loss of his dog. At this time there is no drainage or open wounds to his lower extremities. The left leg edema is measuring smaller today. Still has a significant amount of edema on bilateral lower extremities With dry flaky skin. He will be referred to the lymphedema clinic for further management. Will continue 3 layer compression wraps and follow-up in 1-2 weeks.Denies any pain, fever, chairs recently. No recent falls or injuries reported during this visit. 05/07/18 on evaluation today patient actually appears to be doing very well in regard to his lower extremities in general all things considered. With that being said he is having some pain in the legs just due to the amount of swelling. He does have an area where he had a blister on the left lateral lower extremity this is open at this point other than that there's nothing else weeping at this time. 05/14/18 on evaluation today patient actually appears to be doing excellent all things considered in regard to his lower extremities. He still has a couple areas of weeping on each leg which has continued to be the issue for him. He does have  an appointment with the lymphedema clinic although this isn't until February 2020. That was the earliest they had. In the meantime he has continued to tolerate the compression wraps without complication. 05/28/18 on evaluation today patient actually appears to be doing more poorly in regard to his left lower extremity where he has a wound open at this point. He also had a fall where he subsequently injured his right great toe which has led to an open wound at the site unfortunately. He has been tolerating the dressing changes without complication in general as far as the wraps are concerned that he has not been putting any dressing on the left 1st toe ulcer site. 06/11/18 on evaluation today patient appears to be doing much worse in regard to his bilateral lower extremity ulcers. He has been tolerating the dressing changes without complication although his legs have not been wrapped more recently. Overall I am not very pleased with the way his legs appear. I do believe he needs to be back in compression wraps he still has not received his compression wraps from the [facility] hospital as of yet. Adam Merritt, Adam Merritt ([id number]) 06/18/18 on evaluation today patient actually appears to be doing significantly better than last time I saw him. He has been tolerating the compression wraps without complication in the circumferential ulcers especially appear to be doing much better. His toe ulcer on the right in regard to the great toe is better although not as good as the legs in my pinion. No fevers chills noted 07/02/18 on evaluation today patient appears to be doing much better in regard to his lower extremity ulcers. Unfortunately since I last saw him he's had the distal portion of his right great toe if he dated  it sounds as if this actually went downhill very quickly. I had only seen him a few days prior and the toe did not appear to be infected at that point subsequently became infected very rapidly and it was  decided by the surgeon that the distal portion of the toe needed to be removed. The patient seems to be doing well in this regard he tells me. With that being said his lower extremities are doing better from the standpoint of the wounds although he is significantly swollen at this point. 07/09/18 on evaluation today patient appears to be doing better in regard to the wounds on his lower extremities. In fact everything is almost completely healed he is just a small area on the left posterior lower extremity that is open at this point. 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 With that being said he needs some kind of compression it does sound like he may not be wearing his compression, that is the wraps, during the entire time between when he's here visit to visit. Apparently his wife took the current one off because it began to "fall apart". 07/16/18 on evaluation today patient appears to be extremely swollen especially in regard to his left lower extremity unfortunately. He also has a new skin tear over the left lower extremity and there's a smaller area on the right lower extremity as well. Unfortunately this seems to be due in part to blistering and fluid buildup in his leg. He did get the reduction wraps that were ordered by the [facility] hospital for him to go to lymphedema clinic. With that being said his wounds on the legs have not healed to the point to where they would likely accept them as a patient lymphedema clinic currently. We need to try to get this to heal. With that being said he's been taking his wraps off which is not doing him any favors at this point. In fact this is probably quite counterproductive compared to what needs to occur. We will likely need to increase to a four layer compression wrap and  continue to also utilize elevation and he has to keep the wraps on not take them off as he's been doing currently hasn't had a wrap on since Saturday. 07/23/18 on evaluation today patient appears to be doing much better in regard to his bilateral lower extremities. In fact his left lower extremity which was the largest is actually 15 cm smaller today compared to what it was last time he was here in our clinic. This is obviously good news after just one week. Nonetheless the differences he actually kept the wraps on during the entire week this time. That's not typical for him. I do believe he understands a little bit better now the severity of the situation and why it's important for him to keep these wraps on. 07/30/18 on evaluation today patient actually appears to be doing rather well in regard to his lower extremities. His legs are much smaller than they have been in the past and he actually has only one very small rudder superficial region remaining that is not closed on the left lateral/posterior lower extremity even this is almost completely close. I do believe likely next week he will be healed without any complications. I do think we need to continue the wraps however this seems  to be beneficial for him. I also think it may be a good time for [place] to go ahead and see about getting the appointment with the lymphedema clinic which is supposed to be made for him in order to keep this moving along and hopefully get them into compression wraps that will in the end help him to remain healed. 08/06/18 on evaluation today patient actually appears to be doing very well in regard as bilateral lower extremities. In fact his wounds appear to be completely healed at this time. 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 He is in the process of getting appointment with the lymphedema clinic we have made this referral were just waiting to hear back on  the schedule time. We need to follow up on that today as well. 08/13/18 on evaluation today patient actually appears to be doing very well in regard to his bilateral lower extremities there are no open wounds at this point. We are gonna go ahead and see about ordering the Velcro compression wraps for him had a discussion with them about [place] doing it versus the [place] they feel like they can definitely afford going ahead and get the wraps themselves and they would prefer to try to avoid having to go to the lymphedema clinic if it all possible which I completely understand. As long as he has good compression I'm okay either way. 3/17//20 on evaluation today patient actually appears to be doing well in regard to his bilateral lower extremity ulcers. He has been tolerating the dressing changes without complication specifically the compression wraps. Overall is had no issues my fingers finding that I see at this point is that he is having trouble with constipation. He tells me he has not been able to go to the bathroom for about six days. He's taken to over-the-counter oral laxatives unfortunately this is not helping. He has not contacted his doctor. Adam Merritt, Adam Merritt ([id number]) 08/27/18 on evaluation today patient appears to be doing fairly well in regard to his lower extremities at this point. There does not appear to be any new altars is swelling is very well controlled. We are still waiting for his Velcro compression wraps to arrive that should be sometime in the next week is Artie sent the check for these. Patient History Information obtained from Patient. Family History Cancer - Paternal Grandparents, Kidney Disease - Siblings, Lung Disease - Mother, No family history of Diabetes, Heart Disease, Hereditary Spherocytosis, Hypertension, Seizures, Stroke, Thyroid Problems, Tuberculosis. Social History Never smoker, Marital Status - Married, Alcohol Use - Never, Drug Use - No History, Caffeine Use -  Never. Medical History Eyes Denies history of Cataracts, Glaucoma, Optic Neuritis Ear/Nose/Mouth/Throat Denies history of Chronic sinus problems/congestion, Middle ear problems Hematologic/Lymphatic Patient has history of Anemia - aplastic anemia, Lymphedema Denies history of Hemophilia, Human Immunodeficiency Virus, Sickle Cell Disease Respiratory Patient has history of Chronic Obstructive Pulmonary Disease (COPD), Sleep Apnea Denies history of Aspiration, Asthma, Pneumothorax, Tuberculosis Cardiovascular Patient has history of Congestive Heart Failure, Coronary Artery Disease, Hypertension, Peripheral Venous Disease Denies history of Angina, Arrhythmia, Deep Vein Thrombosis, Hypotension, Myocardial Infarction, Peripheral Arterial Disease, Phlebitis, Vasculitis Gastrointestinal Denies history of Cirrhosis , Colitis, Crohn s, Hepatitis A, Hepatitis B, Hepatitis C Endocrine Patient has history of Type II Diabetes Denies history of Type I Diabetes Genitourinary Denies history of End Stage Renal Disease Immunological Denies history of Lupus Erythematosus, Raynaud s, Scleroderma Integumentary (Skin) Denies history of History of Burn,  History of pressure wounds Musculoskeletal Patient has history of Osteoarthritis Denies history of Gout, Rheumatoid Arthritis, Osteomyelitis Neurologic Patient has history of Neuropathy Denies history of Dementia, Quadriplegia, Paraplegia, Seizure Disorder Oncologic Patient has history of Received Chemotherapy - last dose 2006 Denies history of Received Radiation Psychiatric Denies history of Anorexia/bulimia, Confinement Anxiety Hospitalization/Surgery History - 11/03/2013, [facility], bronchitis. Medical And Surgical History Notes Respiratory home O2 at night as needed Adam Merritt, Adam Merritt ([id number]) Cardiovascular varicose veins Neurologic CVA - 1992 Review of Systems (ROS) Constitutional Symptoms (General Health) Denies complaints or symptoms of  Fever, Chills. Respiratory The patient has no complaints or symptoms. Cardiovascular Complains or has symptoms of LE edema. Psychiatric The patient has no complaints or symptoms. Objective Constitutional Well-nourished and well-hydrated in no acute distress. Vitals Time Taken: 9:53 AM, Height: 66 in, Weight: 323 lbs, BMI: 52.1, Temperature: 98.4 F, Pulse: 72 bpm, Respiratory Rate: 16 breaths/min, Blood Pressure: 145/77 mmHg. Respiratory normal breathing without difficulty. clear to auscultation bilaterally. Cardiovascular regular rate and rhythm with normal S1, S2. Psychiatric this patient is able to make decisions and demonstrates good insight into disease process. Alert and Oriented x 3. pleasant and cooperative. General Notes: Patient's wounds currently have completely resolved were mainly just managing his lymphedema at this point. Nonetheless performing the live wraps are keeping things under control and preventing new wounds from occurring. That is why we continue to wrap him until he gets his Velcro wraps. 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 ([id number]) Non-pressure chronic ulcer of skin of other sites with fat layer exposed Obesity, unspecified Plan Follow-up Appointments: Return Appointment in 1 week. Nurse Visit as needed - Friday 08/30/2018 Edema Control: 4 Layer Compression System - Bilateral My suggestion currently is gonna be that we go ahead and continue with the above wound care measures for the next week. The patient is in agreement with plan. We will subsequently see were things stand at follow-up. If anything changes and let me know. Please see above for specific wound care orders. We will see patient for re-evaluation in 1 week(s) here in the  clinic. If anything worsens or changes patient will contact our office for additional recommendations. Electronic Signature(s) Signed: 08/28/2018 11:01:46 PM By: Worthy Keeler PA-C Entered By: Worthy Keeler on 08/27/2018 10:53:08 Adam Merritt ([id number]) -------------------------------------------------------------------------------- ROS/PFSH Details Patient Name: Adam Merritt Date of Service: 08/27/2018 10:00 AM Medical Record Number: [id number] Patient Account Number: [id number] Date of Birth/Sex: January 13, 1946 (73 y.o. M) Treating RN: Montey Hora Primary Care Provider: [name] Other Clinician: Referring Provider: [name] Treating Provider/Extender: Melburn Hake, [name]: 20 Information Obtained From Patient Wound History Do you currently have one or more open woundso No Have you tested positive for osteomyelitis (bone infection)o No Have you had any tests for circulation on your legso Yes Constitutional Symptoms (General Health) Complaints and Symptoms: Negative for: Fever; Chills Cardiovascular Complaints and Symptoms: Positive for: LE edema Medical History: Positive for: Congestive Heart Failure; Coronary Artery Disease; Hypertension; Peripheral Venous Disease Negative for: Angina; Arrhythmia; Deep Vein Thrombosis; Hypotension; Myocardial Infarction; Peripheral Arterial Disease; Phlebitis; Vasculitis Past Medical History Notes: varicose veins Eyes Medical History: Negative for: Cataracts; Glaucoma; Optic Neuritis Ear/Nose/Mouth/Throat Medical History: Negative for: Chronic sinus problems/congestion; Middle ear problems Hematologic/Lymphatic Medical History: Positive for: Anemia - aplastic anemia; Lymphedema Negative  for: Hemophilia; Human Immunodeficiency Virus; Sickle Cell Disease Respiratory Complaints and Symptoms: No Complaints or Symptoms Medical History: Positive for: Chronic Obstructive Pulmonary Disease (COPD); Sleep  Apnea Negative for: Aspiration; Asthma; Pneumothorax; Tuberculosis Past Medical History Notes: Adam Merritt, Adam Merritt ([id number]) home O2 at night as needed Gastrointestinal Medical History: Negative for: Cirrhosis ; Colitis; Crohnos; Hepatitis A; Hepatitis B; Hepatitis C Endocrine Medical History: Positive for: Type II Diabetes Negative for: Type I Diabetes Time with diabetes: since 2006 Treated with: Insulin Blood sugar tested every day: Yes Tested : Blood sugar testing results: Breakfast: 209 Genitourinary Medical History: Negative for: End Stage Renal Disease Immunological Medical History: Negative for: Lupus Erythematosus; Raynaudos; Scleroderma Integumentary (Skin) Medical History: Negative for: History of Burn; History of pressure wounds Musculoskeletal Medical History: Positive for: Osteoarthritis Negative for: Gout; Rheumatoid Arthritis; Osteomyelitis Neurologic Medical History: Positive for: Neuropathy Negative for: Dementia; Quadriplegia; Paraplegia; Seizure Disorder Past Medical History Notes: CVA - 1992 Oncologic Medical History: Positive for: Received Chemotherapy - last dose 2006 Negative for: Received Radiation Psychiatric Complaints and Symptoms: No Complaints or Symptoms Medical History: Adam Merritt, Adam Merritt ([id number]) Negative for: Anorexia/bulimia; Confinement Anxiety Immunizations Pneumococcal Vaccine: Received Pneumococcal Vaccination: Yes Implantable Devices No devices added Hospitalization / Surgery History Name of Hospital Purpose of Hospitalization/Surgery Date [facility] bronchitis 11/03/2013 Family and Social History Cancer: Yes - Paternal Grandparents; Diabetes: No; Heart Disease: No; Hereditary Spherocytosis: No; Hypertension: No; Kidney Disease: Yes - Siblings; Lung Disease: Yes - Mother; Seizures: No; Stroke: No; Thyroid Problems: No; Tuberculosis: No; Never smoker; Marital Status - Married; Alcohol Use: Never; Drug Use: No History; Caffeine Use:  Never; Financial Concerns: No; Food, Clothing or Shelter Needs: No; Support System Lacking: No; Transportation Concerns: No; Advanced Directives: No; Patient does not want information on Advanced Directives Physician Affirmation I have reviewed and agree with the above information. Electronic Signature(s) Signed: 08/27/2018 5:06:46 PM By: Montey Hora Signed: 08/28/2018 11:01:46 PM By: Worthy Keeler PA-C Entered By: Worthy Keeler on 08/27/2018 10:52:36 Adam Merritt ([id number]) -------------------------------------------------------------------------------- SuperBill Details Patient Name: Adam Merritt Date of Service: 08/27/2018 Medical Record Number: [id number] Patient Account Number: [id number] Date of Birth/Sex: Aug 28, 1945 (73 y.o. M) Treating RN: Montey Hora Primary Care Provider: [name] Other Clinician: Referring Provider: [name] Treating Provider/Extender: Melburn Hake, [name]: 20 Diagnosis Coding ICD-10 Codes Code Description I89.0 Lymphedema, not elsewhere classified L97.512 Non-pressure chronic ulcer of other part of right foot with fat layer exposed L97.822 Non-pressure chronic ulcer of other part of left lower leg with fat layer exposed L98.492 Non-pressure chronic ulcer of skin of other sites with fat layer exposed E66.9 Obesity, unspecified Facility Procedures CPT4: Description Modifier Quantity Code [id number] BILATERAL: Application of multi-layer venous compression system; leg (below 1 knee), including ankle and foot. 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

## 2018-08-29 NOTE — Progress Notes (Signed)
ABU, HEAVIN ([id number]) Visit Report for 08/27/2018 Arrival Information Details Patient Name: Adam Merritt, Adam Merritt Date of Service: 08/27/2018 10:00 AM Medical Record Number: [id number] Patient Account Number: [id number] Date of Birth/Sex: Jul 07, 1945 (73 y.o. M) Treating RN: Army Melia Primary Care Hyacinth Marcelli: [name] Other Clinician: Referring Maysin Carstens: [name] Treating Taje Littler/Extender: Melburn Hake, [name]: 28 Visit Information History Since Last Visit Added or deleted any medications: No Patient Arrived: Wheel Chair Any new allergies or adverse reactions: No Arrival Time: 09:52 Had a fall or experienced change in No Accompanied By: wife activities of daily living that may affect Transfer Assistance: None risk of falls: Patient Has Alerts: Yes Signs or symptoms of abuse/neglect since last visito No Patient Alerts: Patient on Blood Thinner Hospitalized since last visit: No Aspirin 325mg  Implantable device outside of the clinic excluding No Type II Diabetes cellular tissue based products placed in the center ABI 5/19 L 1.1 R 0.9 since last visit: TBI L 0.6 R 0.6 Has Dressing in Place as Prescribed: Yes Pain Present Now: No Electronic Signature(s) Signed: 08/27/2018 4:18:28 PM By: Army Melia Entered By: Army Melia on 08/27/2018 09:52:57 Adam Merritt ([id number]) -------------------------------------------------------------------------------- Compression Therapy Details Patient Name: Adam Merritt Date of Service: 08/27/2018 10:00 AM Medical Record Number: [id number] Patient Account Number: [id number] Date of Birth/Sex: 1946/01/27 (73 y.o. M) Treating RN: Montey Hora Primary Care Illona Bulman: [name] Other Clinician: Referring Kehlani Vancamp: [name] Treating Saphyre Cillo/Extender: Melburn Hake, [name]: 20 Compression Therapy Performed for Wound Assessment: NonWound Condition Lymphedema - Bilateral Leg Performed By: Clinician Montey Hora, RN Compression Type: Four Layer Pre Treatment ABI: 1.1 Post Procedure Diagnosis Same as Pre-procedure Electronic Signature(s) Signed: 08/27/2018 5:06:46 PM By: Montey Hora Entered By: Montey Hora on 08/27/2018 11:35:34 Adam Merritt ([id number]) -------------------------------------------------------------------------------- Encounter Discharge Information Details Patient Name: Adam Merritt Date of Service: 08/27/2018 10:00 AM Medical Record Number: [id number] Patient Account Number: [id number] Date of Birth/Sex: 11/20/1945 (73 y.o. M) Treating RN: Harold Barban Primary Care Danay Mckellar: [name] Other Clinician: Referring Annaclaire Walsworth: [name] Treating Tekoa Amon/Extender: Melburn Hake, [name]: 20 Encounter Discharge Information Items Discharge Condition: Stable Ambulatory Status: Wheelchair Discharge Destination: Home Transportation: Private Auto Accompanied By: wife Schedule Follow-up Appointment: Yes Clinical Summary of Care: Electronic Signature(s) Signed: 08/27/2018 10:49:56 AM By: Harold Barban Entered By: Harold Barban on 08/27/2018 10:49:55 Adam Merritt ([id number]) -------------------------------------------------------------------------------- Lower Extremity Assessment Details Patient Name: Adam Merritt Date of Service: 08/27/2018 10:00 AM Medical Record Number: [id number] Patient Account Number: [id number] Date of Birth/Sex: 1946/02/03 (73 y.o. M) Treating RN: Army Melia Primary Care Katlen Seyer: [name] Other Clinician: Referring Latanja Lehenbauer: [name] Treating Lakota Schweppe/Extender: Melburn Hake, [name]: 20 Edema Assessment Assessed: [Left: No] [Right: No] [Left: Edema] [Right: :] Calf Left: Right: Point of Measurement: 37 cm From Medial Instep 48 cm 45 cm Ankle Left: Right: Point of Measurement: 12 cm From Medial Instep 34 cm 28 cm Vascular Assessment Pulses: Dorsalis Pedis Palpable: [Left:Yes]  [Right:Yes] Extremity colors, hair growth, and conditions: Extremity Color: [Left:Hyperpigmented] [Right:Hyperpigmented] Hair Growth on Extremity: [Left:No] [Right:No] Temperature of Extremity: [Left:Warm < 3 seconds] [Right:Warm < 3 seconds] Toe Nail Assessment Left: Right: Thick: Yes Yes Discolored: Yes Yes Deformed: No No Improper Length and Hygiene: No No Electronic Signature(s) Signed: 08/27/2018 4:18:28 PM By: Army Melia Entered By: Army Melia on 08/27/2018 10:05:05 Adam Merritt ([id number]) -------------------------------------------------------------------------------- Multi Wound Chart Details Patient Name: Adam Merritt Date of Service: 08/27/2018 10:00 AM Medical Record  Number: [id number] Patient Account Number: [id number] Date of Birth/Sex: Oct 28, 1945 (73 y.o. M) Treating RN: Montey Hora Primary Care Avry Monteleone: [name] Other Clinician: Referring Akyia Borelli: [name] Treating Vicki Chaffin/Extender: Melburn Hake, [name]: 20 Vital Signs Height(in): 66 Pulse(bpm): 72 Weight(lbs): 323 Blood Pressure(mmHg): 145/77 Body Mass Index(BMI): 52 Temperature(F): 98.4 Respiratory Rate 16 (breaths/min): Wound Assessments Treatment Notes Electronic Signature(s) Signed: 08/27/2018 5:06:46 PM By: Montey Hora Entered By: Montey Hora on 08/27/2018 10:20:41 Adam Merritt ([id number]) -------------------------------------------------------------------------------- [place] Details Patient Name: Adam Merritt Date of Service: 08/27/2018 10:00 AM Medical Record Number: [id number] Patient Account Number: [id number] Date of Birth/Sex: 1946/01/27 (73 y.o. M) Treating RN: Montey Hora Primary Care Glenisha Gundry: [name] Other Clinician: Referring Carleton Van[place]: [name] Treating Marqui Formby/Extender: Melburn Hake, [name]: 20 Active Inactive Abuse / Safety / Falls / Self Care Management Nursing Diagnoses: Potential for  falls Goals: Patient will remain injury free related to falls Date Initiated: 04/09/2018 Target Resolution Date: 07/13/2018 Goal Status: Active Interventions: Assess fall risk on admission and as needed Notes: Orientation to the Wound Care Program Nursing Diagnoses: Knowledge deficit related to the wound healing center program Goals: Patient/caregiver will verbalize understanding of the [facility] Date Initiated: 04/09/2018 Target Resolution Date: 07/13/2018 Goal Status: Active Interventions: Provide education on orientation to the wound center Notes: Electronic Signature(s) Signed: 08/27/2018 5:06:46 PM By: Montey Hora Entered By: Montey Hora on 08/27/2018 10:20:27 Adam Merritt ([id number]) -------------------------------------------------------------------------------- Pain Assessment Details Patient Name: Adam Merritt Date of Service: 08/27/2018 10:00 AM Medical Record Number: [id number] Patient Account Number: [id number] Date of Birth/Sex: Oct 16, 1945 (73 y.o. M) Treating RN: Army Melia Primary Care Ronda Rajkumar: [name] Other Clinician: Referring Alyana Kreiter: [name] Treating Jaeliana Lococo/Extender: Melburn Hake, [name]: 20 Active Problems Location of Pain Severity and Description of Pain Patient Has Paino No Site Locations Pain Management and Medication Current Pain Management: Electronic Signature(s) Signed: 08/27/2018 4:18:28 PM By: Army Melia Entered By: Army Melia on 08/27/2018 09:53:53 Adam Merritt ([id number]) -------------------------------------------------------------------------------- Patient/Caregiver Education Details Patient Name: Adam Merritt Date of Service: 08/27/2018 10:00 AM Medical Record Number: [id number] Patient Account Number: [id number] Date of Birth/Gender: 1946-05-16 (73 y.o. M) Treating RN: Harold Barban Primary Care Physician: [name] Other Clinician: Referring Physician: [name] Treating Physician/Extender: Sharalyn Ink in Treatment: 20 Education Assessment Education Provided To: Patient Education Topics Provided Wound/Skin Impairment: Handouts: Caring for Your Ulcer Methods: Demonstration, Explain/Verbal Responses: State content correctly Electronic Signature(s) Signed: 08/27/2018 5:26:11 PM By: Harold Barban Entered By: Harold Barban on 08/27/2018 10:50:22 Adam Merritt ([id number]) -------------------------------------------------------------------------------- [place] Details Patient Name: Adam Merritt Date of Service: 08/27/2018 10:00 AM Medical Record Number: [id number] Patient Account Number: [id number] Date of Birth/Sex: 1945-09-16 (73 y.o. M) Treating RN: Army Melia Primary Care Denia Mcvicar: [name] Other Clinician: Referring Kiante Petrovich: [name] Treating Apphia Cropley/Extender: Melburn Hake, [name]: 20 Vital Signs Time Taken: 09:53 Temperature (F): 98.4 Height (in): 66 Pulse (bpm): 72 Weight (lbs): 323 Respiratory Rate (breaths/min): 16 Body Mass Index (BMI): 52.1 Blood Pressure (mmHg): 145/77 Reference Range: 80 - 120 mg / dl Electronic Signature(s) Signed: 08/27/2018 4:18:28 PM By: Army Melia Entered By: Army Melia on 08/27/2018 09:54:08

## 2018-08-30 ENCOUNTER — Other Ambulatory Visit: Payer: Self-pay

## 2018-08-30 DIAGNOSIS — E11621 Type 2 diabetes mellitus with foot ulcer: Secondary | ICD-10-CM | POA: Diagnosis not present

## 2018-08-30 NOTE — Progress Notes (Signed)
YASSEN, KINNETT ([id number]) Visit Report for 08/30/2018 Arrival Information Details Patient Name: Adam Merritt, Adam Merritt Date of Service: 08/30/2018 1:00 PM Medical Record Number: [id number] Patient Account Number: [id number] Date of Birth/Sex: 12/07/1945 (73 y.o. M) Treating RN: Harold Barban Primary Care Jazlin Tapscott: [name] Other Clinician: Referring Shannah Conteh: [name] Treating Khyan Oats/Extender: Melburn Hake, [name]: 37 Visit Information History Since Last Visit Added or deleted any medications: No Patient Arrived: Cane Any new allergies or adverse reactions: No Arrival Time: 12:52 Had a fall or experienced change in No Accompanied By: self activities of daily living that may affect Transfer Assistance: None risk of falls: Patient Identification Verified: Yes Signs or symptoms of abuse/neglect since last visito No Secondary Verification Process Yes Hospitalized since last visit: No Completed: Has Dressing in Place as Prescribed: Yes Patient Has Alerts: Yes Has Compression in Place as Prescribed: Yes Patient Alerts: Patient on Blood Pain Present Now: No Thinner Aspirin 325mg  Type II Diabetes ABI 5/19 L 1.1 R 0.9 TBI L 0.6 R 0.6 Electronic Signature(s) Signed: 08/30/2018 3:52:12 PM By: Harold Barban Entered By: Harold Barban on 08/30/2018 12:52:58 Adam Merritt ([id number]) -------------------------------------------------------------------------------- Clinic Level of Care Assessment Details Patient Name: Adam Merritt Date of Service: 08/30/2018 1:00 PM Medical Record Number: [id number] Patient Account Number: [id number] Date of Birth/Sex: 1945-08-23 (73 y.o. M) Treating RN: Harold Barban Primary Care Dawaun Brancato: [name] Other Clinician: Referring Haelee Bolen: [name] Treating Yosef Krogh/Extender: Melburn Hake, [name]: 20 Clinic Level of Care Assessment Items TOOL 1 Quantity Score []  - Use when EandM and Procedure is performed on  INITIAL visit 0 ASSESSMENTS - Nursing Assessment / Reassessment []  - General Physical Exam (combine w/ comprehensive assessment (listed just below) when 0 performed on new pt. evals) []  - 0 Comprehensive Assessment (HX, ROS, Risk Assessments, Wounds Hx, etc.) ASSESSMENTS - Wound and Skin Assessment / Reassessment []  - Dermatologic / Skin Assessment (not related to wound area) 0 ASSESSMENTS - Ostomy and/or Continence Assessment and Care []  - Incontinence Assessment and Management 0 []  - 0 Ostomy Care Assessment and Management (repouching, etc.) PROCESS - Coordination of Care []  - Simple Patient / Family Education for ongoing care 0 []  - 0 Complex (extensive) Patient / Family Education for ongoing care []  - 0 Staff obtains Programmer, systems, Records, Test Results / Process Orders []  - 0 Staff telephones HHA, Nursing Homes / Clarify orders / etc []  - 0 Routine Transfer to another Facility (non-emergent condition) []  - 0 Routine Hospital Admission (non-emergent condition) []  - 0 New Admissions / Biomedical engineer / Ordering NPWT, Apligraf, etc. []  - 0 Emergency Hospital Admission (emergent condition) PROCESS - Special Needs []  - Pediatric / Minor Patient Management 0 []  - 0 Isolation Patient Management []  - 0 Hearing / Language / Visual special needs []  - 0 Assessment of Community assistance (transportation, D/C planning, etc.) []  - 0 Additional assistance / Altered mentation []  - 0 Support Surface(s) Assessment (bed, cushion, seat, etc.) TINA, TEMME ([id number]) INTERVENTIONS - Miscellaneous []  - External ear exam 0 []  - 0 Patient Transfer (multiple staff / Civil Service fast streamer / Similar devices) []  - 0 Simple Staple / Suture removal (25 or less) []  - 0 Complex Staple / Suture removal (26 or more) []  - 0 Hypo/Hyperglycemic Management (do not check if billed separately) []  - 0 Ankle / Brachial Index (ABI) - do not check if billed separately Has the patient been seen at the  hospital within the last three years: Yes Total Score:  0 Level Of Care: ____ Electronic Signature(s) Signed: 08/30/2018 3:52:12 PM By: Harold Barban Entered By: Harold Barban on 08/30/2018 13:12:27 Adam Merritt ([id number]) -------------------------------------------------------------------------------- Compression Therapy Details Patient Name: Adam Merritt Date of Service: 08/30/2018 1:00 PM Medical Record Number: [id number] Patient Account Number: [id number] Date of Birth/Sex: June 12, 1945 (73 y.o. M) Treating RN: Harold Barban Primary Care Camary Sosa: [name] Other Clinician: Referring Talana Slatten: [name] Treating Avalie Oconnor/Extender: STONE III, [name]: 20 Compression Therapy Performed for Wound Assessment: NonWound Condition Lymphedema - Bilateral Leg Performed By: Clinician Harold Barban, RN Compression Type: Four Layer Electronic Signature(s) Signed: 08/30/2018 3:52:12 PM By: Harold Barban Entered By: Harold Barban on 08/30/2018 12:53:38 Adam Merritt ([id number]) -------------------------------------------------------------------------------- Encounter Discharge Information Details Patient Name: Adam Merritt Date of Service: 08/30/2018 1:00 PM Medical Record Number: [id number] Patient Account Number: [id number] Date of Birth/Sex: 1945-10-26 (73 y.o. M) Treating RN: Harold Barban Primary Care Aison Malveaux: [name] Other Clinician: Referring Jahzaria Vary: [name] Treating Bartosz Luginbill/Extender: Melburn Hake, [name]: 20 Encounter Discharge Information Items Discharge Condition: Stable Ambulatory Status: Cane Discharge Destination: Home Transportation: Private Auto Accompanied By: self Schedule Follow-up Appointment: Yes Clinical Summary of Care: Electronic Signature(s) Signed: 08/30/2018 3:52:12 PM By: Harold Barban Entered By: Harold Barban on 08/30/2018 13:12:12

## 2018-09-03 ENCOUNTER — Encounter: Payer: Medicare Other | Admitting: Physician Assistant

## 2018-09-03 ENCOUNTER — Other Ambulatory Visit: Payer: Self-pay

## 2018-09-03 DIAGNOSIS — E11621 Type 2 diabetes mellitus with foot ulcer: Secondary | ICD-10-CM | POA: Diagnosis not present

## 2018-09-04 NOTE — Progress Notes (Signed)
Adam Merritt ([id number]) Visit Report for 09/03/2018 Arrival Information Details Patient Name: Adam Merritt, Adam Merritt Date of Service: 09/03/2018 12:45 PM Medical Record Number: [id number] Patient Account Number: [id number] Date of Birth/Sex: 12-03-1945 (73 y.o. M) Treating RN: Adam Merritt Primary Care Jocelyn Lowery: [name] Other Clinician: Referring Shaquel Chavous: [name] Treating Kieran Arreguin/Extender: Melburn Hake, [name]: 21 Visit Information History Since Last Visit Added or deleted any medications: No Patient Arrived: Cane Any new allergies or adverse reactions: No Arrival Time: 12:44 Had a fall or experienced change in No Accompanied By: wife activities of daily living that may affect Transfer Assistance: None risk of falls: Patient Has Alerts: Yes Signs or symptoms of abuse/neglect since last visito No Patient Alerts: Patient on Blood Thinner Hospitalized since last visit: No Aspirin 325mg  Has Dressing in Place as Prescribed: Yes Type II Diabetes ABI 5/19 L 1.1 R 0.9 Pain Present Now: No TBI L 0.6 R 0.6 Electronic Signature(s) Signed: 09/03/2018 1:18:53 PM By: Adam Merritt Entered By: Adam Merritt on 09/03/2018 12:52:32 Adam Merritt ([id number]) -------------------------------------------------------------------------------- Clinic Level of Care Assessment Details Patient Name: Adam Merritt Date of Service: 09/03/2018 12:45 PM Medical Record Number: [id number] Patient Account Number: [id number] Date of Birth/Sex: Sep 19, 1945 (73 y.o. M) Treating RN: Montey Hora Primary Care Geovanna Simko: [name] Other Clinician: Referring Pau Banh: [name] Treating Ardeth Repetto/Extender: Melburn Hake, [name]: 21 Clinic Level of Care Assessment Items TOOL 4 Quantity Score []  - Use when only an EandM is performed on FOLLOW-UP visit 0 ASSESSMENTS - Nursing Assessment / Reassessment X - Reassessment of Co-morbidities (includes updates in patient status) 1  10 X- 1 5 Reassessment of Adherence to Treatment Plan ASSESSMENTS - Wound and Skin Assessment / Reassessment []  - Simple Wound Assessment / Reassessment - one wound 0 []  - 0 Complex Wound Assessment / Reassessment - multiple wounds X- 1 10 Dermatologic / Skin Assessment (not related to wound area) ASSESSMENTS - Focused Assessment X - Circumferential Edema Measurements - multi extremities 1 5 []  - 0 Nutritional Assessment / Counseling / Intervention []  - 0 Lower Extremity Assessment (monofilament, tuning fork, pulses) []  - 0 Peripheral Arterial Disease Assessment (using hand held doppler) ASSESSMENTS - Ostomy and/or Continence Assessment and Care []  - Incontinence Assessment and Management 0 []  - 0 Ostomy Care Assessment and Management (repouching, etc.) PROCESS - Coordination of Care X - Simple Patient / Family Education for ongoing care 1 15 []  - 0 Complex (extensive) Patient / Family Education for ongoing care X- 1 10 Staff obtains Programmer, systems, Records, Test Results / Process Orders []  - 0 Staff telephones HHA, Nursing Homes / Clarify orders / etc []  - 0 Routine Transfer to another Facility (non-emergent condition) []  - 0 Routine Hospital Admission (non-emergent condition) []  - 0 New Admissions / Biomedical engineer / Ordering NPWT, Apligraf, etc. []  - 0 Emergency Hospital Admission (emergent condition) X- 1 10 Simple Discharge Coordination CAIDE, CAMPI ([id number]) []  - 0 Complex (extensive) Discharge Coordination PROCESS - Special Needs []  - Pediatric / Minor Patient Management 0 []  - 0 Isolation Patient Management []  - 0 Hearing / Language / Visual special needs []  - 0 Assessment of Community assistance (transportation, D/C planning, etc.) []  - 0 Additional assistance / Altered mentation []  - 0 Support Surface(s) Assessment (bed, cushion, seat, etc.) INTERVENTIONS - Wound Cleansing / Measurement []  - Simple Wound Cleansing - one wound 0 []  - 0 Complex  Wound Cleansing - multiple wounds []  - 0 Wound Imaging (photographs - any  number of wounds) []  - 0 Wound Tracing (instead of photographs) []  - 0 Simple Wound Measurement - one wound []  - 0 Complex Wound Measurement - multiple wounds INTERVENTIONS - Wound Dressings []  - Small Wound Dressing one or multiple wounds 0 []  - 0 Medium Wound Dressing one or multiple wounds []  - 0 Large Wound Dressing one or multiple wounds []  - 0 Application of Medications - topical []  - 0 Application of Medications - injection INTERVENTIONS - Miscellaneous []  - External ear exam 0 []  - 0 Specimen Collection (cultures, biopsies, blood, body fluids, etc.) []  - 0 Specimen(s) / Culture(s) sent or taken to Lab for analysis []  - 0 Patient Transfer (multiple staff / Civil Service fast streamer / Similar devices) []  - 0 Simple Staple / Suture removal (25 or less) []  - 0 Complex Staple / Suture removal (26 or more) []  - 0 Hypo / Hyperglycemic Management (close monitor of Blood Glucose) []  - 0 Ankle / Brachial Index (ABI) - do not check if billed separately X- 1 5 Vital Signs KITO, CUFFE ([id number]) Has the patient been seen at the hospital within the last three years: Yes Total Score: 70 Level Of Care: New/Established - Level 2 Electronic Signature(s) Signed: 09/03/2018 4:25:13 PM By: Montey Hora Entered By: Montey Hora on 09/03/2018 13:25:51 Adam Merritt ([id number]) -------------------------------------------------------------------------------- Encounter Discharge Information Details Patient Name: Adam Merritt Date of Service: 09/03/2018 12:45 PM Medical Record Number: [id number] Patient Account Number: [id number] Date of Birth/Sex: Jan 14, 1946 (73 y.o. M) Treating RN: Harold Barban Primary Care Gabriel Conry: [name] Other Clinician: Referring Srihari Shellhammer: [name] Treating Graycie Halley/Extender: Melburn Hake, [name]: 21 Encounter Discharge Information Items Discharge Condition:  Stable Ambulatory Status: Cane Discharge Destination: Home Transportation: Private Auto Accompanied By: wife Schedule Follow-up Appointment: Yes Clinical Summary of Care: Electronic Signature(s) Signed: 09/03/2018 3:46:03 PM By: Harold Barban Entered By: Harold Barban on 09/03/2018 13:43:47 Adam Merritt ([id number]) -------------------------------------------------------------------------------- Lower Extremity Assessment Details Patient Name: Adam Merritt Date of Service: 09/03/2018 12:45 PM Medical Record Number: [id number] Patient Account Number: [id number] Date of Birth/Sex: 1946/01/01 (73 y.o. M) Treating RN: Adam Merritt Primary Care Cynthya Yam: [name] Other Clinician: Referring Nishka Heide: [name] Treating Amato Sevillano/Extender: Melburn Hake, [name]: 21 Edema Assessment Assessed: [Left: No] [Right: No] Edema: [Left: No] [Right: No] Calf Left: Right: Point of Measurement: 37 cm From Medial Instep 57 cm 50 cm Ankle Left: Right: Point of Measurement: 12 cm From Medial Instep 34 cm 28 cm Vascular Assessment Pulses: Dorsalis Pedis Palpable: [Left:Yes] Extremity colors, hair growth, and conditions: Extremity Color: [Left:Hyperpigmented] [Right:Hyperpigmented] Hair Growth on Extremity: [Left:No] [Right:No] Temperature of Extremity: [Left:Warm < 3 seconds] [Right:Warm < 3 seconds] Toe Nail Assessment Left: Right: Thick: Yes Yes Discolored: Yes Yes Deformed: No No Improper Length and Hygiene: No No Electronic Signature(s) Signed: 09/03/2018 1:18:53 PM By: Adam Merritt Entered By: Adam Merritt on 09/03/2018 12:56:45 Adam Merritt ([id number]) -------------------------------------------------------------------------------- [place] Details Patient Name: Adam Merritt Date of Service: 09/03/2018 12:45 PM Medical Record Number: [id number] Patient Account Number: [id number] Date of Birth/Sex: Oct 03, 1945 (73 y.o. M) Treating RN:  Montey Hora Primary Care Author [place]: [name] Other Clinician: Referring Roylee Chaffin: [name] Treating Sequoya Hogsett/Extender: Melburn Hake, [name]: 21 Active Inactive Electronic Signature(s) Signed: 09/03/2018 1:25:26 PM By: Montey Hora Entered By: Montey Hora on 09/03/2018 13:25:25 Adam Merritt ([id number]) -------------------------------------------------------------------------------- Non-Wound Condition Assessment Details Patient Name: Adam Merritt Date of Service: 09/03/2018 12:45 PM Medical Record Number: [id number] Patient  Account Number: [id number] Date of Birth/Sex: 12-03-1945 (73 y.o. M) Treating RN: Adam Merritt Primary Care Amaro Mangold: [name] Other Clinician: Referring Wilhelm Ganaway: [name] Treating [place]ah Stegemann/Extender: STONE III, [name]: 21 Non-Wound Condition: Condition: Lymphedema Location: Leg Side: Bilateral Periwound Skin Texture Texture Color No Abnormalities Noted: No No Abnormalities Noted: No Callus: No Atrophie Blanche: No Crepitus: No Cyanosis: No Excoriation: No Ecchymosis: No Friable: No Erythema: No Induration: Yes Hemosiderin Staining: Yes Rash: No Mottled: No Scarring: No Pallor: No Rubor: No Moisture No Abnormalities Noted: No Temperature / Pain Dry / Scaly: No Temperature: No Abnormality Maceration: Yes Electronic Signature(s) Signed: 09/03/2018 1:18:53 PM By: Adam Merritt Entered By: Adam Merritt on 09/03/2018 12:52:24 Adam Merritt ([id number]) -------------------------------------------------------------------------------- Pain Assessment Details Patient Name: Adam Merritt Date of Service: 09/03/2018 12:45 PM Medical Record Number: [id number] Patient Account Number: [id number] Date of Birth/Sex: April 02, 1946 (73 y.o. M) Treating RN: Adam Merritt Primary Care Nike Southers: [name] Other Clinician: Referring Luwanna Brossman: [name] Treating Tamer Baughman/Extender: Melburn Hake, [name] in  Treatment: 21 Active Problems Location of Pain Severity and Description of Pain Patient Has Paino No Site Locations Pain Management and Medication Current Pain Management: Electronic Signature(s) Signed: 09/03/2018 1:18:53 PM By: Adam Merritt Entered By: Adam Merritt on 09/03/2018 12:44:55 Adam Merritt ([id number]) -------------------------------------------------------------------------------- Patient/Caregiver Education Details Patient Name: Adam Merritt Date of Service: 09/03/2018 12:45 PM Medical Record Number: [id number] Patient Account Number: [id number] Date of Birth/Gender: 04/30/1946 (73 y.o. M) Treating RN: Montey Hora Primary Care Physician: [name] Other Clinician: Referring Physician: [name] Treating Physician/Extender: Sharalyn Ink in Treatment: 21 Education Assessment Education Provided To: Patient and Caregiver Education Topics Provided Venous: Handouts: Other: farrow wraps Methods: Demonstration, Explain/Verbal Responses: State content correctly Electronic Signature(s) Signed: 09/03/2018 4:25:13 PM By: Montey Hora Entered By: Montey Hora on 09/03/2018 13:26:10 Adam Merritt ([id number]) -------------------------------------------------------------------------------- [place] Details Patient Name: Adam Merritt Date of Service: 09/03/2018 12:45 PM Medical Record Number: [id number] Patient Account Number: [id number] Date of Birth/Sex: 1945/08/01 (73 y.o. M) Treating RN: Adam Merritt Primary Care Klaudia Beirne: [name] Other Clinician: Referring Marney Treloar: [name] Treating Cambrey Lupi/Extender: Melburn Hake, [name]: 21 Vital Signs Time Taken: 12:44 Temperature (F): 98.1 Height (in): 66 Pulse (bpm): 76 Weight (lbs): 323 Respiratory Rate (breaths/min): 16 Body Mass Index (BMI): 52.1 Blood Pressure (mmHg): 155/76 Reference Range: 80 - 120 mg / dl Electronic Signature(s) Signed: 09/03/2018 1:18:53 PM By: Adam Merritt Entered By: Adam Merritt on 09/03/2018 12:52:45

## 2018-09-04 NOTE — Progress Notes (Signed)
DMARCO, BALDUS ([id number]) Visit Report for 09/03/2018 Chief Complaint Document Details Patient Name: Adam Merritt, Adam Merritt Date of Service: 09/03/2018 12:45 PM Medical Record Number: [id number] Patient Account Number: [id number] Date of Birth/Sex: 1946-05-08 (73 y.o. M) Treating RN: Montey Hora Primary Care Provider: [name] Other Clinician: Referring Provider: [name] Treating Provider/Extender: Melburn Hake, Adam Merritt [name]: 21 Information Obtained from: Patient Chief Complaint he is here for evaluation of bilateral lower extremity lymphedema right right toe and left LE ulcers Electronic Signature(s) Signed: 09/03/2018 8:26:01 PM By: Worthy Keeler PA-C Entered By: Worthy Keeler on 09/03/2018 12:54:21 Adam Merritt ([id number]) -------------------------------------------------------------------------------- HPI Details Patient Name: Adam Merritt Date of Service: 09/03/2018 12:45 PM Medical Record Number: [id number] Patient Account Number: [id number] Date of Birth/Sex: Mar 23, 1946 (73 y.o. M) Treating RN: Montey Hora Primary Care Provider: [name] Other Clinician: Referring Provider: [name] Treating Provider/Extender: Melburn Hake, Adam Merritt [name]: 21 History of Present Illness HPI Description: 10/18/17-He is here for initial evaluation of bilateral lower extremity ulcerations in the presence of venous insufficiency and lymphedema. He has been seen by vascular medicine in the past, Dr. Lucky Cowboy, last seen in 2016. He does have a history of abnormal ABIs, which is to be expected given his lymphedema and venous insufficiency. According to Epic, it appears that all attempts for arterial evaluation and/or angiography were not follow through with by patient. He does have a history of being seen in lymphedema clinic in 2018, stopped going approximately 6 months ago stating "it didn't do any good". He does not have lymphedema pumps, he does not have custom fit  compression wrap/stockings. He is diabetic and his recent A1c last month was 7.6. He admits to chronic bilateral lower extremity pain, no change in pain since blister and ulceration development. He is currently being treated with Levaquin for bronchitis. He has home health and we will continue. 10/25/17-He is here in follow-up evaluation for bilateral lower extremity ulcerationssubtle he remains on Levaquin for bronchitis. Right lower extremity with no evidence of drainage or ulceration, persistent left lower extremity ulceration. He states that home health has not been out since his appointment. He went to [facility] on Tuesday, studies revealed: RIGHT ABI 0.9, TBI 0.6 LEFT ABI 1.1, TBI 0.6 with triphasic flow bilaterally. We will continue his same treatment plan. He has been educated on compression therapy and need for elevation. He will benefit from lymphedema pumps 11/01/17-He is here in follow-up evaluation for left lower extremity ulcer. The right lower extremity remains healed. He has home health services, but they have not been out to see the patient for 2-3 weeks. He states it home health physical therapy changed his dressing yesterday after therapy; he placed Ace wrap compression. We are still waiting for lymphedema pumps, reordered d/t need for company change. 11/08/17-He is here in follow-up evaluation for left lower extremity ulcer. It is improved. Edema is significantly improved with compression therapy. We will continue with same treatment plan and he will follow-up next week. No word regarding lymphedema pumps 11/15/17-He is here in follow-up evaluation for left lower extremity ulcer. He is healed and will be discharged from wound care services. I have reached out to [facility] regarding his lymphedema pumps. They have been unable to reach the patient; the contact number they had with the patient's wife's cell phone and she has not answered any unrecognized  calls. Contact should be made today, trial planned for next week; [facility] will continue to  follow 11/27/17 on evaluation today patient has multiple blistered areas over the right lower extremity his left lower extremity appears to be doing okay. These blistered areas show signs of no infection which is great news. With that being said he did have some necrotic skin overlying which was mechanically debrided away with saline and gauze today without complication. Overall post debridement the wounds appear to be doing better but in general his swelling seems to be increased. This is obviously not good news. I think this is what has given rise to the blisters. 12/04/17 on evaluation today patient presents for follow-up concerning his bilateral lower extremity edema in the right lower extremity ulcers. He has been tolerating the dressing changes without complication. With that being said he has had no real issues with the wraps which is also good news. Overall I'm pleased with the progress he's been making. 12/11/17 on evaluation today patient appears to be doing rather well in regard to his right lateral lower extremity ulcer. He's been tolerating the dressing changes without complication. Fortunately there does not appear to be any evidence of infection at this time. Overall I'm pleased with the progress that is being made. Unfortunately he has been in the hospital due to having what sounds to be a stomach virus/flu fortunately that is starting to get better. 12/18/17 on evaluation today patient actually appears to be doing very well in regard to his bilateral lower extremities the swelling is under fairly good control his lymphedema pumps are still not up and running quite yet. With that being said he does have several areas of opening noted as far as wounds are concerned mainly over the left lower extremity. With that being said I do believe once he gets lymphedema pumps this would least help you  mention some the fluid and preventing this from occurring. Hopefully that will be set up soon sleeves are Artie in place at his home he just waiting for the machine. 12/25/17 on evaluation today patient actually appears to be doing excellent in fact all of his ulcers appear to have resolved his XAINE, SANSOM. ([id number]) legs appear very well. I do think he needs compression stockings we have discussed this and they are actually going to go to [place] today to elastic therapy to get this fitted for him. I think that is definitely a good thing to do. Readmission: 04/09/18 upon evaluation today this patient is seen for readmission due to bilateral lower extremity lymphedema. He has significant swelling of his extremities especially on the left although the right is also swollen he has weeping from both sides. There are no obvious open wounds at this point. Fortunately he has been doing fairly well for quite a bit of time since I last saw him. Nonetheless unfortunately this seems to have reopened and is giving quite a bit of trouble. He states this began about a week ago when he first called [place] to get in to be seen. No fevers, chills, nausea, or vomiting noted at this time. He has not been using his lymphedema pumps due to the fact that they won't fit on his leg at this point likewise is also not been using his compression for essentially the same reason. 04/16/18 upon evaluation today patient actually appears to be doing a little better in regard to the fluid in his bilateral lower extremities. With that being said he's had three falls since I saw him last week. He also states that he's been feeling very poorly. I was  concerned last week and feel like that the concern is still there as far as the congestion in his chest is concerned he seems to be breathing about the same as last week but again he states he's very weak he's not even able to walk further than from the chair to the door. His wife had  to buy a wheelchair just to be able to get them out of the house to get to the appointment today. This has me very concerned. 04/23/18 on evaluation today patient actually appears to be doing much better than last week's evaluation. At that time actually had to transport him to the ER via EMS and he subsequently was admitted for acute pulmonary edema, acute renal failure, and acute congestive heart failure. Fortunately he is doing much better. Apparently they did dialyze him and were able to take off roughly 35 pounds of fluid. Nonetheless he is feeling much better both in regard to his breathing and he's able to get around much better at this time compared to previous. Overall I'm very happy with how things are at this time. There does not appear to be any evidence of infection currently. No fevers, chills, nausea, or vomiting noted at this time. 04/30/2018 patient seen today for follow-up and management of bilateral lower extremity lymphedema. He did express being more sad today than usual due to the recent loss of his dog. He states that he has been compliant with using the lymphedema pumps. However he does admit a minute over the last 2-3 days he has not been using the pumps due to the recent loss of his dog. At this time there is no drainage or open wounds to his lower extremities. The left leg edema is measuring smaller today. Still has a significant amount of edema on bilateral lower extremities With dry flaky skin. He will be referred to the lymphedema clinic for further management. Will continue 3 layer compression wraps and follow-up in 1-2 weeks.Denies any pain, fever, chairs recently. No recent falls or injuries reported during this visit. 05/07/18 on evaluation today patient actually appears to be doing very well in regard to his lower extremities in general all things considered. With that being said he is having some pain in the legs just due to the amount of swelling. He does have an  area where he had a blister on the left lateral lower extremity this is open at this point other than that there's nothing else weeping at this time. 05/14/18 on evaluation today patient actually appears to be doing excellent all things considered in regard to his lower extremities. He still has a couple areas of weeping on each leg which has continued to be the issue for him. He does have an appointment with the lymphedema clinic although this isn't until February 2020. That was the earliest they had. In the meantime he has continued to tolerate the compression wraps without complication. 05/28/18 on evaluation today patient actually appears to be doing more poorly in regard to his left lower extremity where he has a wound open at this point. He also had a fall where he subsequently injured his right great toe which has led to an open wound at the site unfortunately. He has been tolerating the dressing changes without complication in general as far as the wraps are concerned that he has not been putting any dressing on the left 1st toe ulcer site. 06/11/18 on evaluation today patient appears to be doing much worse in regard to his  bilateral lower extremity ulcers. He has been tolerating the dressing changes without complication although his legs have not been wrapped more recently. Overall I am not very pleased with the way his legs appear. I do believe he needs to be back in compression wraps he still has not received his compression wraps from the [facility] hospital as of yet. 06/18/18 on evaluation today patient actually appears to be doing significantly better than last time I saw him. He has been tolerating the compression wraps without complication in the circumferential ulcers especially appear to be doing much better. His toe ulcer on the right in regard to the great toe is better although not as good as the legs in my pinion. No fevers chills noted Adam Merritt, Adam Merritt ([id number]) 07/02/18 on evaluation  today patient appears to be doing much better in regard to his lower extremity ulcers. Unfortunately since I last saw him he's had the distal portion of his right great toe if he dated it sounds as if this actually went downhill very quickly. I had only seen him a few days prior and the toe did not appear to be infected at that point subsequently became infected very rapidly and it was decided by the surgeon that the distal portion of the toe needed to be removed. The patient seems to be doing well in this regard he tells me. With that being said his lower extremities are doing better from the standpoint of the wounds although he is significantly swollen at this point. 07/09/18 on evaluation today patient appears to be doing better in regard to the wounds on his lower extremities. In fact everything is almost completely healed he is just a small area on the left posterior lower extremity that is open at this point. 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 With that being said he needs some kind of compression it does sound like he may not be wearing his compression, that is the wraps, during the entire time between when he's here visit to visit. Apparently his wife took the current one off because it began to "fall apart". 07/16/18 on evaluation today patient appears to be extremely swollen especially in regard to his left lower extremity unfortunately. He also has a new skin tear over the left lower extremity and there's a smaller area on the right lower extremity as well. Unfortunately this seems to be due in part to blistering and fluid buildup in his leg. He did get the reduction wraps that were ordered by the [facility] hospital for him to go to lymphedema clinic. With that being said his wounds on the legs have not healed  to the point to where they would likely accept them as a patient lymphedema clinic currently. We need to try to get this to heal. With that being said he's been taking his wraps off which is not doing him any favors at this point. In fact this is probably quite counterproductive compared to what needs to occur. We will likely need to increase to a four layer compression wrap and continue to also utilize elevation and he has to keep the wraps on not take them off as he's been doing currently hasn't had a wrap on since Saturday. 07/23/18 on evaluation today patient appears to be doing much better in regard to his bilateral lower extremities.  In fact his left lower extremity which was the largest is actually 15 cm smaller today compared to what it was last time he was here in our clinic. This is obviously good news after just one week. Nonetheless the differences he actually kept the wraps on during the entire week this time. That's not typical for him. I do believe he understands a little bit better now the severity of the situation and why it's important for him to keep these wraps on. 07/30/18 on evaluation today patient actually appears to be doing rather well in regard to his lower extremities. His legs are much smaller than they have been in the past and he actually has only one very small rudder superficial region remaining that is not closed on the left lateral/posterior lower extremity even this is almost completely close. I do believe likely next week he will be healed without any complications. I do think we need to continue the wraps however this seems to be beneficial for him. I also think it may be a good time for [place] to go ahead and see about getting the appointment with the lymphedema clinic which is supposed to be made for him in order to keep this moving along and hopefully get them into compression wraps that will in the end help him to remain healed. 08/06/18 on evaluation today patient  actually appears to be doing very well in regard as bilateral lower extremities. In fact his wounds appear to be completely healed at this time. 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 He is in the process of getting appointment with the lymphedema clinic we have made this referral were just waiting to hear back on the schedule time. We need to follow up on that today as well. 08/13/18 on evaluation today patient actually appears to be doing very well in regard to his bilateral lower extremities there are no open wounds at this point. We are gonna go ahead and see about ordering the Velcro compression wraps for him had a discussion with them about [place] doing it versus the [place] they feel like they can definitely afford going ahead and get the wraps themselves and they would prefer to try to avoid having to go to the lymphedema clinic if it all possible which I completely understand. As long as he has good compression I'm okay either way. 3/17//20 on evaluation today patient actually appears to be doing well in regard to his bilateral lower extremity ulcers. He has been tolerating the dressing changes without complication specifically the compression wraps. Overall is had no issues my fingers finding that I see at this point is that he is having trouble with constipation. He tells me he has not been able to go to the bathroom for about six days. He's taken to over-the-counter oral laxatives unfortunately this is not helping. He has not contacted his doctor. 08/27/18 on evaluation today patient appears to be doing fairly well in regard to his lower extremities at this point. There does not appear to be any new altars is swelling is very well controlled. We are still waiting for his Velcro compression wraps to arrive that should be sometime in the next week is Artie sent the check for these. 09/03/18 on evaluation today patient appears to be doing  excellent in regard to his bilateral lower extremities which he shows Adam Merritt, Adam Merritt. ([id number]) no signs of wound openings in regard to this point. He does have  his Velcro compression wraps which did arrive in the mail since I saw him last week. Overall he is doing excellent in my pinion. Electronic Signature(s) Signed: 09/03/2018 8:26:01 PM By: Worthy Keeler PA-C Entered By: Worthy Keeler on 09/03/2018 13:25:27 Adam Merritt ([id number]) -------------------------------------------------------------------------------- Physical Exam Details Patient Name: Adam Merritt Date of Service: 09/03/2018 12:45 PM Medical Record Number: [id number] Patient Account Number: [id number] Date of Birth/Sex: 01/28/1946 (73 y.o. M) Treating RN: Montey Hora Primary Care Provider: [name] Other Clinician: Referring Provider: [name] Treating Provider/Extender: Adam Merritt, Adam Merritt [name]: 21 Constitutional Well-nourished and well-hydrated in no acute distress. Respiratory normal breathing without difficulty. Cardiovascular trace pitting edema of the bilateral lower extremities. Psychiatric this patient is able to make decisions and demonstrates good insight into disease process. Alert and Oriented x 3. pleasant and cooperative. Notes Patient's wound bed currently shows evidence of good epithelialization at this time and overall he has no open wounds noted currently which is excellent news. He's done very well as far as I'm concerned with the compression wraps I'm hopeful that the Velcro compression wraps will continue to do well for him. Electronic Signature(s) Signed: 09/03/2018 8:26:01 PM By: Worthy Keeler PA-C Entered By: Worthy Keeler on 09/03/2018 13:25:55 Adam Merritt ([id number]) -------------------------------------------------------------------------------- Physician Orders Details Patient Name: Adam Merritt Date of Service: 09/03/2018 12:45 PM Medical Record  Number: [id number] Patient Account Number: [id number] Date of Birth/Sex: [birth year] (73 y.o. 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 M) Treating RN: Montey Hora Primary Care Provider: [name] Other Clinician: Referring Provider: [name] Treating Provider/Extender: Melburn Hake, Adam Merritt [name]: 21 Active Problems ICD-10 Evaluated Encounter Code Description Active Date Today Diagnosis I89.0 Lymphedema, not elsewhere classified 04/09/2018 No Yes L97.512 Non-pressure chronic ulcer of other part of right foot with fat 05/28/2018 No Yes layer exposed L97.822 Non-pressure chronic ulcer of other part of left lower leg with 05/28/2018 No Yes fat layer exposed L98.492 Non-pressure chronic ulcer of skin of other sites with fat layer 06/11/2018 No Yes exposed E66.9 Obesity, unspecified 04/09/2018 No  Yes Inactive Problems Resolved Problems ICD-10 Code Description Active Date Resolved Date L03.115 Cellulitis of right lower limb 04/09/2018 04/09/2018 L03.116 Cellulitis of left lower limb 04/09/2018 04/09/2018 Electronic Signature(s) Signed: 09/03/2018 8:26:01 PM By: Worthy Keeler PA-C Entered By: Worthy Keeler on 09/03/2018 12:54:16 Adam Merritt ([id number]) Adam Merritt, Adam Merritt ([id number]) -------------------------------------------------------------------------------- Progress Note Details Patient Name: Adam Merritt Date of Service: 09/03/2018 12:45 PM Medical Record Number: [id number] Patient Account Number: [id number] Date of Birth/Sex: 1946-05-14 (73 y.o. M) Treating RN: Montey Hora Primary Care Provider: Humphrey Rolls,  [place].Rei Other Clinician: Referring Provider: [name] Treating Provider/Extender: Melburn Hake, Adam Merritt [name]: 21 Subjective Chief Complaint Information obtained from Patient he is here for evaluation of bilateral lower extremity lymphedema right right toe and left LE ulcers History of Present Illness (HPI) 10/18/17-He is here for initial evaluation of bilateral lower extremity ulcerations in the presence of venous insufficiency and lymphedema. He has been seen by vascular medicine in the past, Dr. Lucky Cowboy, last seen in 2016. He does have a history of abnormal ABIs, which is to be expected given his lymphedema and venous insufficiency. According to Epic, it appears that all attempts for arterial evaluation and/or angiography were not follow through with by patient. He does have a history of being seen in lymphedema clinic in 2018, stopped going approximately 6 months ago stating "it didn't do any good". He does not have lymphedema pumps, he does not have custom fit compression wrap/stockings. He is diabetic and his recent A1c last month was 7.6. He admits to chronic bilateral lower extremity pain, no change in pain since blister and ulceration development. He is  currently being treated with Levaquin for bronchitis. He has home health and we will continue. 10/25/17-He is here in follow-up evaluation for bilateral lower extremity ulcerationssubtle he remains on Levaquin for bronchitis. Right lower extremity with no evidence of drainage or ulceration, persistent left lower extremity ulceration. He states that home health has not been out since his appointment. He went to [facility] on Tuesday, studies revealed: RIGHT ABI 0.9, TBI 0.6 LEFT ABI 1.1, TBI 0.6 with triphasic flow bilaterally. We will continue his same treatment plan. He has been educated on compression therapy and need for elevation. He will benefit from lymphedema pumps 11/01/17-He is here in follow-up evaluation for left lower extremity ulcer. The right lower extremity remains healed. He has home health services, but they have not been out to see the patient for 2-3 weeks. He states it home health physical therapy changed his dressing yesterday after therapy; he placed Ace wrap compression. We are still waiting for lymphedema pumps, reordered d/t need for company change. 11/08/17-He is here in follow-up evaluation for left lower extremity ulcer. It is improved. Edema is significantly improved with compression therapy. We will continue with same treatment plan and he will follow-up next week. No word regarding lymphedema pumps 11/15/17-He is here in follow-up evaluation for left lower extremity ulcer. He is healed and will be discharged from wound care services. I have reached out to [facility] regarding his lymphedema pumps. They have been unable to reach the patient; the contact number they had with the patient's wife's cell phone and she has not answered any unrecognized calls. Contact should be made today, trial planned for next week; [facility] will continue to follow 11/27/17 on evaluation today patient has multiple blistered areas over the right lower extremity his  left lower extremity appears to be doing okay. These blistered areas show signs of no infection which is great news. With that being said he did have some necrotic skin overlying which was mechanically debrided away with saline and gauze today without complication. Overall post debridement the wounds appear to be doing better but in general his swelling seems to be increased. This is obviously not good news. I think this is what has given rise to the blisters. 12/04/17 on evaluation today patient presents for follow-up concerning his bilateral lower extremity edema in the right lower extremity ulcers. He has been tolerating the dressing changes  without complication. With that being said he has had no real issues with the wraps which is also good news. Overall I'm pleased with the progress he's been making. 12/11/17 on evaluation today patient appears to be doing rather well in regard to his right lateral lower extremity ulcer. He's been tolerating the dressing changes without complication. Fortunately there does not appear to be any evidence of infection at this time. Overall I'm pleased with the progress that is being made. Unfortunately he has been in the hospital due to having what sounds to be a stomach virus/flu fortunately that is starting to get better. 12/18/17 on evaluation today patient actually appears to be doing very well in regard to his bilateral lower extremities the Adam Merritt, Adam Merritt. ([id number]) swelling is under fairly good control his lymphedema pumps are still not up and running quite yet. With that being said he does have several areas of opening noted as far as wounds are concerned mainly over the left lower extremity. With that being said I do believe once he gets lymphedema pumps this would least help you mention some the fluid and preventing this from occurring. Hopefully that will be set up soon sleeves are Artie in place at his home he just waiting for the machine. 12/25/17 on  evaluation today patient actually appears to be doing excellent in fact all of his ulcers appear to have resolved his legs appear very well. I do think he needs compression stockings we have discussed this and they are actually going to go to [facility] today to elastic therapy to get this fitted for him. I think that is definitely a good thing to do. Readmission: 04/09/18 upon evaluation today this patient is seen for readmission due to bilateral lower extremity lymphedema. He has significant swelling of his extremities especially on the left although the right is also swollen he has weeping from both sides. There are no obvious open wounds at this point. Fortunately he has been doing fairly well for quite a bit of time since I last saw him. Nonetheless unfortunately this seems to have reopened and is giving quite a bit of trouble. He states this began about a week ago when he first called [place] to get in to be seen. No fevers, chills, nausea, or vomiting noted at this time. He has not been using his lymphedema pumps due to the fact that they won't fit on his leg at this point likewise is also not been using his compression for essentially the same reason. 04/16/18 upon evaluation today patient actually appears to be doing a little better in regard to the fluid in his bilateral lower extremities. With that being said he's had three falls since I saw him last week. He also states that he's been feeling very poorly. I was concerned last week and feel like that the concern is still there as far as the congestion in his chest is concerned he seems to be breathing about the same as last week but again he states he's very weak he's not even able to walk further than from the chair to the door. His wife had to buy a wheelchair just to be able to get them out of the house to get to the appointment today. This has me very concerned. 04/23/18 on evaluation today patient actually appears to be doing much better  than last week's evaluation. At that time actually had to transport him to the ER via EMS and he subsequently was admitted for acute pulmonary edema,  acute renal failure, and acute congestive heart failure. Fortunately he is doing much better. Apparently they did dialyze him and were able to take off roughly 35 pounds of fluid. Nonetheless he is feeling much better both in regard to his breathing and he's able to get around much better at this time compared to previous. Overall I'm very happy with how things are at this time. There does not appear to be any evidence of infection currently. No fevers, chills, nausea, or vomiting noted at this time. 04/30/2018 patient seen today for follow-up and management of bilateral lower extremity lymphedema. He did express being more sad today than usual due to the recent loss of his dog. He states that he has been compliant with using the lymphedema pumps. However he does admit a minute over the last 2-3 days he has not been using the pumps due to the recent loss of his dog. At this time there is no drainage or open wounds to his lower extremities. The left leg edema is measuring smaller today. Still has a significant amount of edema on bilateral lower extremities With dry flaky skin. He will be referred to the lymphedema clinic for further management. Will continue 3 layer compression wraps and follow-up in 1-2 weeks.Denies any pain, fever, chairs recently. No recent falls or injuries reported during this visit. 05/07/18 on evaluation today patient actually appears to be doing very well in regard to his lower extremities in general all things considered. With that being said he is having some pain in the legs just due to the amount of swelling. He does have an area where he had a blister on the left lateral lower extremity this is open at this point other than that there's nothing else weeping at this time. 05/14/18 on evaluation today patient actually appears  to be doing excellent all things considered in regard to his lower extremities. He still has a couple areas of weeping on each leg which has continued to be the issue for him. He does have an appointment with the lymphedema clinic although this isn't until February 2020. That was the earliest they had. In the meantime he has continued to tolerate the compression wraps without complication. 05/28/18 on evaluation today patient actually appears to be doing more poorly in regard to his left lower extremity where he has a wound open at this point. He also had a fall where he subsequently injured his right great toe which has led to an open wound at the site unfortunately. He has been tolerating the dressing changes without complication in general as far as the wraps are concerned that he has not been putting any dressing on the left 1st toe ulcer site. 06/11/18 on evaluation today patient appears to be doing much worse in regard to his bilateral lower extremity ulcers. He has been tolerating the dressing changes without complication although his legs have not been wrapped more recently. Overall I am not very pleased with the way his legs appear. I do believe he needs to be back in compression wraps he still has not received his compression wraps from the [facility] hospital as of yet. Adam Merritt, Adam Merritt ([id number]) 06/18/18 on evaluation today patient actually appears to be doing significantly better than last time I saw him. He has been tolerating the compression wraps without complication in the circumferential ulcers especially appear to be doing much better. His toe ulcer on the right in regard to the great toe is better although not as good as the  legs in my pinion. No fevers chills noted 07/02/18 on evaluation today patient appears to be doing much better in regard to his lower extremity ulcers. Unfortunately since I last saw him he's had the distal portion of his right great toe if he dated it sounds as if  this actually went downhill very quickly. I had only seen him a few days prior and the toe did not appear to be infected at that point subsequently became infected very rapidly and it was decided by the surgeon that the distal portion of the toe needed to be removed. The patient seems to be doing well in this regard he tells me. With that being said his lower extremities are doing better from the standpoint of the wounds although he is significantly swollen at this point. 07/09/18 on evaluation today patient appears to be doing better in regard to the wounds on his lower extremities. In fact everything is almost completely healed he is just a small area on the left posterior lower extremity that is open at this point. 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 With that being said he needs some kind of compression it does sound like he may not be wearing his compression, that is the wraps, during the entire time between when he's here visit to visit. Apparently his wife took the current one off because it began to "fall apart". 07/16/18 on evaluation today patient appears to be extremely swollen especially in regard to his left lower extremity unfortunately. He also has a new skin tear over the left lower extremity and there's a smaller area on the right lower extremity as well. Unfortunately this seems to be due in part to blistering and fluid buildup in his leg. He did get the reduction wraps that were ordered by the [facility] hospital for him to go to lymphedema clinic. With that being said his wounds on the legs have not healed to the point to where they would likely accept them as a patient lymphedema clinic currently. We need to try to get this to heal. With that being said he's been taking his wraps off which is not doing  him any favors at this point. In fact this is probably quite counterproductive compared to what needs to occur. We will likely need to increase to a four layer compression wrap and continue to also utilize elevation and he has to keep the wraps on not take them off as he's been doing currently hasn't had a wrap on since Saturday. 07/23/18 on evaluation today patient appears to be doing much better in regard to his bilateral lower extremities. In fact his left lower extremity which was the largest is actually 15 cm smaller today compared to what it was last time he was here in our clinic. This is obviously good news after just one week. Nonetheless the differences he actually kept the wraps on during the entire week this time. That's not typical for him. I do believe he understands a little bit better now the severity of the situation and why it's important for him to keep these wraps on. 07/30/18 on evaluation today patient actually appears to be doing rather well in regard to his lower extremities. His legs are much smaller than they have been in the past and he actually has only one very small  rudder superficial region remaining that is not closed on the left lateral/posterior lower extremity even this is almost completely close. I do believe likely next week he will be healed without any complications. I do think we need to continue the wraps however this seems to be beneficial for him. I also think it may be a good time for [place] to go ahead and see about getting the appointment with the lymphedema clinic which is supposed to be made for him in order to keep this moving along and hopefully get them into compression wraps that will in the end help him to remain healed. 08/06/18 on evaluation today patient actually appears to be doing very well in regard as bilateral lower extremities. In fact his wounds appear to be completely healed at this time. He does have bilateral lymphedema which has been  extremely well controlled with the compression wraps. He is in the process of getting appointment with the lymphedema clinic we have made this referral were just waiting to hear back on the schedule time. We need to follow up on that today as well. 08/13/18 on evaluation today patient actually appears to be doing very well in regard to his bilateral lower extremities there are no open wounds at this point. We are gonna go ahead and see about ordering the Velcro compression wraps for him had a discussion with them about [place] doing it versus the [place] they feel like they can definitely afford going ahead and get the wraps themselves and they would prefer to try to avoid having to go to the lymphedema clinic if it all possible which I completely understand. As long as he has good compression I'm okay either way. 3/17//20 on evaluation today patient actually appears to be doing well in regard to his bilateral lower extremity ulcers. He has been tolerating the dressing changes without complication specifically the compression wraps. Overall is had no issues my fingers finding that I see at this point is that he is having trouble with constipation. He tells me he has not been able to go to the bathroom for about six days. He's taken to over-the-counter oral laxatives unfortunately this is not helping. He has not contacted his doctor. Adam Merritt, Adam Merritt ([id number]) 08/27/18 on evaluation today patient appears to be doing fairly well in regard to his lower extremities at this point. There does not appear to be any new altars is swelling is very well controlled. We are still waiting for his Velcro compression wraps to arrive that should be sometime in the next week is Artie sent the check for these. 09/03/18 on evaluation today patient appears to be doing excellent in regard to his bilateral lower extremities which he shows no signs of wound openings in regard to this point. He does have his Velcro compression wraps  which did arrive in the mail since I saw him last week. Overall he is doing excellent in my pinion. Patient History Information obtained from Patient. Family History Cancer - Paternal Grandparents, Kidney Disease - Siblings, Lung Disease - Mother, No family history of Diabetes, Heart Disease, Hereditary Spherocytosis, Hypertension, Seizures, Stroke, Thyroid Problems, Tuberculosis. Social History Never smoker, Marital Status - Married, Alcohol Use - Never, Drug Use - No History, Caffeine Use - Never. Medical History Eyes Denies history of Cataracts, Glaucoma, Optic Neuritis Ear/Nose/Mouth/Throat Denies history of Chronic sinus problems/congestion, Middle ear problems Hematologic/Lymphatic Patient has history of Anemia - aplastic anemia, Lymphedema Denies history of Hemophilia, Human Immunodeficiency Virus, Sickle Cell Disease Respiratory Patient  has history of Chronic Obstructive Pulmonary Disease (COPD), Sleep Apnea Denies history of Aspiration, Asthma, Pneumothorax, Tuberculosis Cardiovascular Patient has history of Congestive Heart Failure, Coronary Artery Disease, Hypertension, Peripheral Venous Disease Denies history of Angina, Arrhythmia, Deep Vein Thrombosis, Hypotension, Myocardial Infarction, Peripheral Arterial Disease, Phlebitis, Vasculitis Gastrointestinal Denies history of Cirrhosis , Colitis, Crohn s, Hepatitis A, Hepatitis B, Hepatitis C Endocrine Patient has history of Type II Diabetes Denies history of Type I Diabetes Genitourinary Denies history of End Stage Renal Disease Immunological Denies history of Lupus Erythematosus, Raynaud s, Scleroderma Integumentary (Skin) Denies history of History of Burn, History of pressure wounds Musculoskeletal Patient has history of Osteoarthritis Denies history of Gout, Rheumatoid Arthritis, Osteomyelitis Neurologic Patient has history of Neuropathy Denies history of Dementia, Quadriplegia, Paraplegia, Seizure  Disorder Oncologic Patient has history of Received Chemotherapy - last dose 2006 Denies history of Received Radiation Psychiatric Denies history of Anorexia/bulimia, Confinement Anxiety Hospitalization/Surgery History - 11/03/2013, [facility], bronchitis. Adam Merritt, Adam Merritt ([id number]) Medical And Surgical History Notes Respiratory home O2 at night as needed Cardiovascular varicose veins Neurologic CVA - 1992 Review of Systems (ROS) Constitutional Symptoms (General Health) Denies complaints or symptoms of Fever, Chills. Respiratory The patient has no complaints or symptoms. Cardiovascular Complains or has symptoms of LE edema. Psychiatric The patient has no complaints or symptoms. Objective Constitutional Well-nourished and well-hydrated in no acute distress. Vitals Time Taken: 12:44 PM, Height: 66 in, Weight: 323 lbs, BMI: 52.1, Temperature: 98.1 F, Pulse: 76 bpm, Respiratory Rate: 16 breaths/min, Blood Pressure: 155/76 mmHg. Respiratory normal breathing without difficulty. Cardiovascular trace pitting edema of the bilateral lower extremities. Psychiatric this patient is able to make decisions and demonstrates good insight into disease process. Alert and Oriented x 3. pleasant and cooperative. General Notes: Patient's wound bed currently shows evidence of good epithelialization at this time and overall he has no open wounds noted currently which is excellent news. He's done very well as far as I'm concerned with the compression wraps I'm hopeful that the Velcro compression wraps will continue to do well for him. Other Condition(s) Patient presents with Lymphedema located on the Bilateral Leg. The skin appearance exhibited: Hemosiderin Staining, Induration, Maceration. The skin appearance did not exhibit: Atrophie Blanche, Callus, Crepitus, Cyanosis, Dry/Scaly, Ecchymosis, Erythema, Excoriation, Friable, Mottled, Pallor, Rash, Rubor, Scarring. Skin temperature was noted as  No Abnormality. 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 Patient and his wife are in agreement that plan. If anything else occurs that he needs to see [place] for he knows to contact the office and let us know. Electronic Signature(s) Signed: 09/03/2018 8:26:01 PM By: Worthy Keeler PA-C Entered By: Worthy Keeler on 09/03/2018 13:26:10 Adam Merritt ([id number]) -------------------------------------------------------------------------------- ROS/PFSH Details Patient Name: Adam Merritt Date of Service: 09/03/2018 12:45 PM Medical Record Number: [id number] Patient Account Number: [id number] Date of Birth/Sex: 1946-05-20 (73 y.o. M) Treating RN: Montey Hora Primary Care Provider: [name] Other Clinician: Referring Provider: [name] Treating Provider/Extender: Melburn Hake, Adam Merritt [name]: 21 Information Obtained  From Patient Wound History Do you currently have one or more open woundso No Have you tested positive for osteomyelitis (bone infection)o No Have you had any tests for circulation on your legso Yes Constitutional Symptoms (General Health) Complaints and Symptoms: Negative for: Fever; Chills Cardiovascular Complaints and Symptoms: Positive for: LE edema Medical History: Positive for: Congestive Heart Failure; Coronary Artery Disease; Hypertension; Peripheral Venous Disease Negative for: Angina; Arrhythmia; Deep Vein  Thrombosis; Hypotension; Myocardial Infarction; Peripheral Arterial Disease; Phlebitis; Vasculitis Past Medical History Notes: varicose veins Eyes Medical History: Negative for: Cataracts; Glaucoma; Optic Neuritis Ear/Nose/Mouth/Throat Medical History: Negative for: Chronic sinus problems/congestion; Middle ear problems Hematologic/Lymphatic Medical History: Positive for: Anemia - aplastic anemia; Lymphedema Negative for: Hemophilia; Human Immunodeficiency Virus; Sickle Cell Disease Respiratory Complaints and Symptoms: No Complaints or Symptoms Medical History: Positive for: Chronic Obstructive Pulmonary Disease (COPD); Sleep Apnea Negative for: Aspiration; Asthma; Pneumothorax; Tuberculosis Past Medical History Notes: Adam Merritt, Adam Merritt ([id number]) home O2 at night as needed Gastrointestinal Medical History: Negative for: Cirrhosis ; Colitis; Crohnos; Hepatitis A; Hepatitis B; Hepatitis C Endocrine Medical History: Positive for: Type II Diabetes Negative for: Type I Diabetes Time with diabetes: since 2006 Treated with: Insulin Blood sugar tested every day: Yes Tested : Blood sugar testing results: Breakfast: 209 Genitourinary Medical History: Negative for: End Stage Renal Disease Immunological Medical History: Negative for: Lupus Erythematosus; Raynaudos; Scleroderma Integumentary (Skin) Medical History: Negative for: History of Burn; History of pressure wounds Musculoskeletal Medical History: Positive for: Osteoarthritis Negative for: Gout; Rheumatoid Arthritis; Osteomyelitis Neurologic Medical History: Positive for: Neuropathy Negative for: Dementia; Quadriplegia; Paraplegia; Seizure Disorder Past Medical History Notes: CVA - 1992 Oncologic Medical History: Positive for: Received Chemotherapy - last dose 2006 Negative for: Received Radiation Psychiatric Complaints and Symptoms: No Complaints or Symptoms Medical History: NAETHAN, BRACEWELL  ([id number]) Negative for: Anorexia/bulimia; Confinement Anxiety Immunizations Pneumococcal Vaccine: Received Pneumococcal Vaccination: Yes Implantable Devices No devices added Hospitalization / Surgery History Name of Hospital Purpose of Hospitalization/Surgery Date [facility] bronchitis 11/03/2013 Family and Social History Cancer: Yes - Paternal Grandparents; Diabetes: No; Heart Disease: No; Hereditary Spherocytosis: No; Hypertension: No; Kidney Disease: Yes - Siblings; Lung Disease: Yes - Mother; Seizures: No; Stroke: No; Thyroid Problems: No; Tuberculosis: No; Never smoker; Marital Status - Married; Alcohol Use: Never; Drug Use: No History; Caffeine Use: Never; Financial Concerns: No; Food, Clothing or Shelter Needs: No; Support System Lacking: No; Transportation Concerns: No; Advanced Directives: No; Patient does not want information on Advanced Directives Physician Affirmation I have reviewed and agree with the above information. Electronic Signature(s) Signed: 09/03/2018 4:25:13 PM By: Montey Hora Signed: 09/03/2018 8:26:01 PM By: Worthy Keeler PA-C Entered By: Worthy Keeler on 09/03/2018 13:25:40 Adam Merritt ([id number]) -------------------------------------------------------------------------------- SuperBill Details Patient Name: Adam Merritt Date of Service: 09/03/2018 Medical Record Number: [id number] Patient Account Number: [id number] Date of Birth/Sex: 04-07-1946 (73 y.o. 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 chronic ulcer of skin of other  sites with fat layer exposed E66.9 Obesity, unspecified Facility Procedures CPT4 Code: [id number] Description: 55732 - WOUND CARE VISIT-LEV 2 EST PT Modifier: Quantity: 1 Physician Procedures CPT4 Code Description: [id number] - WC PHYS LEVEL 3 -  EST PT ICD-10 Diagnosis Description I89.0 Lymphedema, not elsewhere classified L97.512 Non-pressure chronic ulcer of other part of right foot with fat L97.822 Non-pressure chronic ulcer of other  part of left lower leg with L98.492 Non-pressure chronic ulcer of skin of other sites with fat layer Modifier: layer exposed fat layer expos exposed Quantity: 1 ed Electronic Signature(s) Signed: 09/03/2018 8:26:01 PM By: Worthy Keeler PA-C Entered By: Worthy Keeler on 09/03/2018 13:26:21

## 2018-09-13 ENCOUNTER — Other Ambulatory Visit
Admission: RE | Admit: 2018-09-13 | Discharge: 2018-09-13 | Disposition: A | Discharge status: Home / Self Care | Payer: Medicare Other | Source: Ambulatory Visit | Attending: Physician Assistant | Admitting: Physician Assistant

## 2018-09-13 ENCOUNTER — Other Ambulatory Visit: Payer: Self-pay

## 2018-09-13 ENCOUNTER — Encounter: Payer: Medicare Other | Attending: Physician Assistant | Admitting: Physician Assistant

## 2018-09-13 DIAGNOSIS — L97822 Non-pressure chronic ulcer of other part of left lower leg with fat layer exposed: Secondary | ICD-10-CM | POA: Diagnosis not present

## 2018-09-13 DIAGNOSIS — I110 Hypertensive heart disease with heart failure: Secondary | ICD-10-CM | POA: Insufficient documentation

## 2018-09-13 DIAGNOSIS — G4730 Sleep apnea, unspecified: Secondary | ICD-10-CM | POA: Diagnosis not present

## 2018-09-13 DIAGNOSIS — L97512 Non-pressure chronic ulcer of other part of right foot with fat layer exposed: Secondary | ICD-10-CM | POA: Insufficient documentation

## 2018-09-13 DIAGNOSIS — J449 Chronic obstructive pulmonary disease, unspecified: Secondary | ICD-10-CM | POA: Insufficient documentation

## 2018-09-13 DIAGNOSIS — E1140 Type 2 diabetes mellitus with diabetic neuropathy, unspecified: Secondary | ICD-10-CM | POA: Insufficient documentation

## 2018-09-13 DIAGNOSIS — I890 Lymphedema, not elsewhere classified: Secondary | ICD-10-CM | POA: Insufficient documentation

## 2018-09-13 DIAGNOSIS — E669 Obesity, unspecified: Secondary | ICD-10-CM | POA: Diagnosis not present

## 2018-09-13 DIAGNOSIS — L03116 Cellulitis of left lower limb: Secondary | ICD-10-CM | POA: Diagnosis present

## 2018-09-13 DIAGNOSIS — E11621 Type 2 diabetes mellitus with foot ulcer: Secondary | ICD-10-CM | POA: Insufficient documentation

## 2018-09-13 DIAGNOSIS — I509 Heart failure, unspecified: Secondary | ICD-10-CM | POA: Insufficient documentation

## 2018-09-13 DIAGNOSIS — M1990 Unspecified osteoarthritis, unspecified site: Secondary | ICD-10-CM | POA: Diagnosis not present

## 2018-09-13 DIAGNOSIS — Z6841 Body Mass Index (BMI) 40.0 and over, adult: Secondary | ICD-10-CM | POA: Diagnosis not present

## 2018-09-13 DIAGNOSIS — L98492 Non-pressure chronic ulcer of skin of other sites with fat layer exposed: Secondary | ICD-10-CM | POA: Diagnosis not present

## 2018-09-13 NOTE — Progress Notes (Signed)
Adam Merritt ([id number]) Visit Report for 09/13/2018 Chief Complaint Document Details Patient Name: Adam Merritt, Adam Merritt Date of Service: 09/13/2018 10:30 AM Medical Record Number: [id number] Patient Account Number: [id number] Date of Birth/Sex: 10/25/1945 (73 y.o. M) Treating RN: Montey Hora Primary Care Provider: [name] Other Clinician: Referring Provider: [name] Treating Provider/Extender: Melburn Hake, Colleen Kotlarz [name]: 22 Information Obtained from: Patient Chief Complaint he is here for evaluation of bilateral lower extremity lymphedema right right toe and left LE ulcers Electronic Signature(s) Signed: 09/13/2018 12:23:18 PM By: Worthy Keeler PA-C Entered By: Worthy Keeler on 09/13/2018 10:23:08 Adam Merritt ([id number]) -------------------------------------------------------------------------------- HPI Details Patient Name: Adam Merritt Date of Service: 09/13/2018 10:30 AM Medical Record Number: [id number] Patient Account Number: [id number] Date of Birth/Sex: 1945/09/16 (73 y.o. M) Treating RN: Montey Hora Primary Care Provider: [name] Other Clinician: Referring Provider: [name] Treating Provider/Extender: Melburn Hake, Edessa Jakubowicz [name]: 22 History of Present Illness HPI Description: 10/18/17-He is here for initial evaluation of bilateral lower extremity ulcerations in the presence of venous insufficiency and lymphedema. He has been seen by vascular medicine in the past, Dr. Lucky Cowboy, last seen in 2016. He does have a history of abnormal ABIs, which is to be expected given his lymphedema and venous insufficiency. According to Epic, it appears that all attempts for arterial evaluation and/or angiography were not follow through with by patient. He does have a history of being seen in lymphedema clinic in 2018, stopped going approximately 6 months ago stating "it didn't do any good". He does not have lymphedema pumps, he does not have custom fit  compression wrap/stockings. He is diabetic and his recent A1c last month was 7.6. He admits to chronic bilateral lower extremity pain, no change in pain since blister and ulceration development. He is currently being treated with Levaquin for bronchitis. He has home health and we will continue. 10/25/17-He is here in follow-up evaluation for bilateral lower extremity ulcerationssubtle he remains on Levaquin for bronchitis. Right lower extremity with no evidence of drainage or ulceration, persistent left lower extremity ulceration. He states that home health has not been out since his appointment. He went to [facility] on Tuesday, studies revealed: RIGHT ABI 0.9, TBI 0.6 LEFT ABI 1.1, TBI 0.6 with triphasic flow bilaterally. We will continue his same treatment plan. He has been educated on compression therapy and need for elevation. He will benefit from lymphedema pumps 11/01/17-He is here in follow-up evaluation for left lower extremity ulcer. The right lower extremity remains healed. He has home health services, but they have not been out to see the patient for 2-3 weeks. He states it home health physical therapy changed his dressing yesterday after therapy; he placed Ace wrap compression. We are still waiting for lymphedema pumps, reordered d/t need for company change. 11/08/17-He is here in follow-up evaluation for left lower extremity ulcer. It is improved. Edema is significantly improved with compression therapy. We will continue with same treatment plan and he will follow-up next week. No word regarding lymphedema pumps 11/15/17-He is here in follow-up evaluation for left lower extremity ulcer. He is healed and will be discharged from wound care services. I have reached out to [facility] regarding his lymphedema pumps. They have been unable to reach the patient; the contact number they had with the patient's wife's cell phone and she has not answered any unrecognized  calls. Contact should be made today, trial planned for next week; [facility] will continue to  follow 11/27/17 on evaluation today patient has multiple blistered areas over the right lower extremity his left lower extremity appears to be doing okay. These blistered areas show signs of no infection which is great news. With that being said he did have some necrotic skin overlying which was mechanically debrided away with saline and gauze today without complication. Overall post debridement the wounds appear to be doing better but in general his swelling seems to be increased. This is obviously not good news. I think this is what has given rise to the blisters. 12/04/17 on evaluation today patient presents for follow-up concerning his bilateral lower extremity edema in the right lower extremity ulcers. He has been tolerating the dressing changes without complication. With that being said he has had no real issues with the wraps which is also good news. Overall I'm pleased with the progress he's been making. 12/11/17 on evaluation today patient appears to be doing rather well in regard to his right lateral lower extremity ulcer. He's been tolerating the dressing changes without complication. Fortunately there does not appear to be any evidence of infection at this time. Overall I'm pleased with the progress that is being made. Unfortunately he has been in the hospital due to having what sounds to be a stomach virus/flu fortunately that is starting to get better. 12/18/17 on evaluation today patient actually appears to be doing very well in regard to his bilateral lower extremities the swelling is under fairly good control his lymphedema pumps are still not up and running quite yet. With that being said he does have several areas of opening noted as far as wounds are concerned mainly over the left lower extremity. With that being said I do believe once he gets lymphedema pumps this would least help you  mention some the fluid and preventing this from occurring. Hopefully that will be set up soon sleeves are Artie in place at his home he just waiting for the machine. 12/25/17 on evaluation today patient actually appears to be doing excellent in fact all of his ulcers appear to have resolved his BASILIO, MEADOW. ([id number]) legs appear very well. I do think he needs compression stockings we have discussed this and they are actually going to go to [facility] today to elastic therapy to get this fitted for him. I think that is definitely a good thing to do. Readmission: 04/09/18 upon evaluation today this patient is seen for readmission due to bilateral lower extremity lymphedema. He has significant swelling of his extremities especially on the left although the right is also swollen he has weeping from both sides. There are no obvious open wounds at this point. Fortunately he has been doing fairly well for quite a bit of time since I last saw him. Nonetheless unfortunately this seems to have reopened and is giving quite a bit of trouble. He states this began about a week ago when he first called [place] to get in to be seen. No fevers, chills, nausea, or vomiting noted at this time. He has not been using his lymphedema pumps due to the fact that they won't fit on his leg at this point likewise is also not been using his compression for essentially the same reason. 04/16/18 upon evaluation today patient actually appears to be doing a little better in regard to the fluid in his bilateral lower extremities. With that being said he's had three falls since I saw him last week. He also states that he's been feeling very poorly. I was  concerned last week and feel like that the concern is still there as far as the congestion in his chest is concerned he seems to be breathing about the same as last week but again he states he's very weak he's not even able to walk further than from the chair to the door. His wife had  to buy a wheelchair just to be able to get them out of the house to get to the appointment today. This has me very concerned. 04/23/18 on evaluation today patient actually appears to be doing much better than last week's evaluation. At that time actually had to transport him to the ER via EMS and he subsequently was admitted for acute pulmonary edema, acute renal failure, and acute congestive heart failure. Fortunately he is doing much better. Apparently they did dialyze him and were able to take off roughly 35 pounds of fluid. Nonetheless he is feeling much better both in regard to his breathing and he's able to get around much better at this time compared to previous. Overall I'm very happy with how things are at this time. There does not appear to be any evidence of infection currently. No fevers, chills, nausea, or vomiting noted at this time. 04/30/2018 patient seen today for follow-up and management of bilateral lower extremity lymphedema. He did express being more sad today than usual due to the recent loss of his dog. He states that he has been compliant with using the lymphedema pumps. However he does admit a minute over the last 2-3 days he has not been using the pumps due to the recent loss of his dog. At this time there is no drainage or open wounds to his lower extremities. The left leg edema is measuring smaller today. Still has a significant amount of edema on bilateral lower extremities With dry flaky skin. He will be referred to the lymphedema clinic for further management. Will continue 3 layer compression wraps and follow-up in 1-2 weeks.Denies any pain, fever, chairs recently. No recent falls or injuries reported during this visit. 05/07/18 on evaluation today patient actually appears to be doing very well in regard to his lower extremities in general all things considered. With that being said he is having some pain in the legs just due to the amount of swelling. He does have an  area where he had a blister on the left lateral lower extremity this is open at this point other than that there's nothing else weeping at this time. 05/14/18 on evaluation today patient actually appears to be doing excellent all things considered in regard to his lower extremities. He still has a couple areas of weeping on each leg which has continued to be the issue for him. He does have an appointment with the lymphedema clinic although this isn't until February 2020. That was the earliest they had. In the meantime he has continued to tolerate the compression wraps without complication. 05/28/18 on evaluation today patient actually appears to be doing more poorly in regard to his left lower extremity where he has a wound open at this point. He also had a fall where he subsequently injured his right great toe which has led to an open wound at the site unfortunately. He has been tolerating the dressing changes without complication in general as far as the wraps are concerned that he has not been putting any dressing on the left 1st toe ulcer site. 06/11/18 on evaluation today patient appears to be doing much worse in regard to his  bilateral lower extremity ulcers. He has been tolerating the dressing changes without complication although his legs have not been wrapped more recently. Overall I am not very pleased with the way his legs appear. I do believe he needs to be back in compression wraps he still has not received his compression wraps from the [facility] hospital as of yet. 06/18/18 on evaluation today patient actually appears to be doing significantly better than last time I saw him. He has been tolerating the compression wraps without complication in the circumferential ulcers especially appear to be doing much better. His toe ulcer on the right in regard to the great toe is better although not as good as the legs in my pinion. No fevers chills noted Adam Merritt, Adam Merritt ([id number]) 07/02/18 on evaluation  today patient appears to be doing much better in regard to his lower extremity ulcers. Unfortunately since I last saw him he's had the distal portion of his right great toe if he dated it sounds as if this actually went downhill very quickly. I had only seen him a few days prior and the toe did not appear to be infected at that point subsequently became infected very rapidly and it was decided by the surgeon that the distal portion of the toe needed to be removed. The patient seems to be doing well in this regard he tells me. With that being said his lower extremities are doing better from the standpoint of the wounds although he is significantly swollen at this point. 07/09/18 on evaluation today patient appears to be doing better in regard to the wounds on his lower extremities. In fact everything is almost completely healed he is just a small area on the left posterior lower extremity that is open at this point. 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 With that being said he needs some kind of compression it does sound like he may not be wearing his compression, that is the wraps, during the entire time between when he's here visit to visit. Apparently his wife took the current one off because it began to "fall apart". 07/16/18 on evaluation today patient appears to be extremely swollen especially in regard to his left lower extremity unfortunately. He also has a new skin tear over the left lower extremity and there's a smaller area on the right lower extremity as well. Unfortunately this seems to be due in part to blistering and fluid buildup in his leg. He did get the reduction wraps that were ordered by the [facility] hospital for him to go to lymphedema clinic. With that being said his wounds on the legs have not healed  to the point to where they would likely accept them as a patient lymphedema clinic currently. We need to try to get this to heal. With that being said he's been taking his wraps off which is not doing him any favors at this point. In fact this is probably quite counterproductive compared to what needs to occur. We will likely need to increase to a four layer compression wrap and continue to also utilize elevation and he has to keep the wraps on not take them off as he's been doing currently hasn't had a wrap on since Saturday. 07/23/18 on evaluation today patient appears to be doing much better in regard to his bilateral lower extremities.  In fact his left lower extremity which was the largest is actually 15 cm smaller today compared to what it was last time he was here in our clinic. This is obviously good news after just one week. Nonetheless the differences he actually kept the wraps on during the entire week this time. That's not typical for him. I do believe he understands a little bit better now the severity of the situation and why it's important for him to keep these wraps on. 07/30/18 on evaluation today patient actually appears to be doing rather well in regard to his lower extremities. His legs are much smaller than they have been in the past and he actually has only one very small rudder superficial region remaining that is not closed on the left lateral/posterior lower extremity even this is almost completely close. I do believe likely next week he will be healed without any complications. I do think we need to continue the wraps however this seems to be beneficial for him. I also think it may be a good time for [place] to go ahead and see about getting the appointment with the lymphedema clinic which is supposed to be made for him in order to keep this moving along and hopefully get them into compression wraps that will in the end help him to remain healed. 08/06/18 on evaluation today patient  actually appears to be doing very well in regard as bilateral lower extremities. In fact his wounds appear to be completely healed at this time. 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 He is in the process of getting appointment with the lymphedema clinic we have made this referral were just waiting to hear back on the schedule time. We need to follow up on that today as well. 08/13/18 on evaluation today patient actually appears to be doing very well in regard to his bilateral lower extremities there are no open wounds at this point. We are gonna go ahead and see about ordering the Velcro compression wraps for him had a discussion with them about [place] doing it versus the [place] they feel like they can definitely afford going ahead and get the wraps themselves and they would prefer to try to avoid having to go to the lymphedema clinic if it all possible which I completely understand. As long as he has good compression I'm okay either way. 3/17//20 on evaluation today patient actually appears to be doing well in regard to his bilateral lower extremity ulcers. He has been tolerating the dressing changes without complication specifically the compression wraps. Overall is had no issues my fingers finding that I see at this point is that he is having trouble with constipation. He tells me he has not been able to go to the bathroom for about six days. He's taken to over-the-counter oral laxatives unfortunately this is not helping. He has not contacted his doctor. 08/27/18 on evaluation today patient appears to be doing fairly well in regard to his lower extremities at this point. There does not appear to be any new altars is swelling is very well controlled. We are still waiting for his Velcro compression wraps to arrive that should be sometime in the next week is Artie sent the check for these. 09/03/18 on evaluation today patient appears to be doing  excellent in regard to his bilateral lower extremities which he shows Adam Merritt, Adam Merritt. ([id number]) no signs of wound openings in regard to this point. He does have  his Velcro compression wraps which did arrive in the mail since I saw him last week. Overall he is doing excellent in my pinion. 09/13/18 on evaluation today patient appears to be doing very well currently in regard to the overall appearance of his bilateral lower extremities although he's a little bit more swollen than last time we saw him. At that point he been discharged without any open wounds. Nonetheless he has a small open wound on the posterior left lower extremity with some evidence of cellulitis noted as well. Fortunately I feel like he has made good progress overall with regard to his lower extremities from were things used to be. Electronic Signature(s) Signed: 09/13/2018 12:23:18 PM By: Worthy Keeler PA-C Entered By: Worthy Keeler on 09/13/2018 11:56:34 Adam Merritt ([id number]) -------------------------------------------------------------------------------- Physical Exam Details Patient Name: Adam Merritt Date of Service: 09/13/2018 10:30 AM Medical Record Number: [id number] Patient Account Number: [id number] Date of Birth/Sex: 10/13/1945 (73 y.o. M) Treating RN: Montey Hora Primary Care Provider: [name] Other Clinician: Referring Provider: [name] Treating Provider/Extender: STONE III, Monte Zinni [name]: 22 Constitutional Obese and well-hydrated in no acute distress. Respiratory normal breathing without difficulty. clear to auscultation bilaterally. Cardiovascular regular rate and rhythm with normal S1, S2. Psychiatric this patient is able to make decisions and demonstrates good insight into disease process. Alert and Oriented x 3. pleasant and cooperative. Notes No sharp debridement was necessary today although I did obtain a culture from the wound site. This will be sent  for evaluation and will just anything we need to from the standpoint treatment once this result returns. Electronic Signature(s) Signed: 09/13/2018 12:23:18 PM By: Worthy Keeler PA-C Entered By: Worthy Keeler on 09/13/2018 11:57:16 Adam Merritt ([id number]) -------------------------------------------------------------------------------- Physician Orders Details Patient Name: Adam Merritt Date of Service: 09/13/2018 10:30 AM Medical Record Number: [id number] Patient Account Number: [id number] Date of Birth/Sex: [birth year] (73 y.o. 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 o May Shower, gently pat wound dry prior to applying new dressing. Primary Wound Dressing Wound #19 Left,Distal,Posterior Lower Leg o Silver Alginate Secondary Dressing Wound #19 Left,Distal,Posterior Lower Leg o Boardered Foam Dressing Dressing Change Frequency Wound #19 Left,Distal,Posterior Lower Leg o Change dressing every other day. Follow-up Appointments Wound #19 Left,Distal,Posterior Lower Leg o Return Appointment in 1 week. Edema Control Wound #19 Left,Distal,Posterior Lower Leg o Patient to wear own Velcro compression garment. o Elevate legs to the level of the heart and pump ankles as often as possible o Compression Pump: Use compression pump on left  lower extremity for 30 minutes, twice daily. - 1 hour, not 30 minutes o Compression Pump: Use compression pump on right lower extremity for 30 minutes, twice daily. - 1 hour, not 30 minutes Laboratory Adam Merritt, Adam Merritt ([id number]) o Bacteria identified in Wound by Culture (MICRO) oooo LOINC Code: [id number] oooo Convenience Name: Wound culture routine Patient Medications Allergies: Corticosteroids (Glucocorticoids) Notifications Medication Indication Start End doxycycline hyclate 09/13/2018  DOSE 1 - oral 100 mg capsule - 1 capsule oral taken 2 times a day for 10 days. Do not take multivitamin with this medication Electronic Signature(s) Signed: 09/13/2018 11:58:25 AM By: Worthy Keeler PA-C Entered By: Worthy Keeler on 09/13/2018 11:58:24 Adam Merritt ([id number]) -------------------------------------------------------------------------------- Problem List Details Patient Name: Adam Merritt Date of Service: 09/13/2018 10:30 AM Medical Record Number: [id number] Patient Account Number: [id number] Date of Birth/Sex: 1945/09/20 (73 y.o. M) Treating RN: Montey Hora Primary Care Provider: [name] Other Clinician: Referring Provider: [name] Treating Provider/Extender: Melburn Hake, Iridian Reader [name]: 22 Active Problems ICD-10 Evaluated Encounter Code Description Active Date Today Diagnosis I89.0 Lymphedema, not elsewhere classified 04/09/2018 No Yes L97.512 Non-pressure chronic ulcer of other part of right foot with fat 05/28/2018 No Yes layer exposed L97.822 Non-pressure chronic ulcer of other part of left lower leg with 05/28/2018 No Yes fat layer exposed L98.492 Non-pressure chronic ulcer of skin of other sites with fat layer 06/11/2018 No Yes exposed E66.9 Obesity, unspecified 04/09/2018 No Yes Inactive Problems Resolved Problems ICD-10 Code Description Active Date Resolved Date L03.115 Cellulitis of right lower limb 04/09/2018 04/09/2018 L03.116 Cellulitis of  left lower limb 04/09/2018 04/09/2018 Electronic Signature(s) Signed: 09/13/2018 12:23:18 PM By: Worthy Keeler PA-C Entered By: Worthy Keeler on 09/13/2018 10:23:04 Adam Merritt ([id number]) Adam Merritt, Adam Merritt ([id number]) -------------------------------------------------------------------------------- Progress Note Details Patient Name: Adam Merritt Date of Service: 09/13/2018 10:30 AM Medical Record Number: [id number] Patient Account Number: [id number] Date of Birth/Sex: 1945-07-27 (73 y.o. M) Treating RN: Montey Hora Primary Care Provider: [name] Other Clinician: Referring Provider: [name] Treating Provider/Extender: Melburn Hake, Harm Jou [name]: 22 Subjective Chief Complaint Information obtained from Patient he is here for evaluation of bilateral lower extremity lymphedema right right toe and left LE ulcers History of Present Illness (HPI) 10/18/17-He is here for initial evaluation of bilateral lower extremity ulcerations in the presence of venous insufficiency and lymphedema. He has been seen by vascular medicine in the past, Dr. Lucky Cowboy, last seen in 2016. He does have a history of abnormal ABIs, which is to be expected given his lymphedema and venous insufficiency. According to Epic, it appears that all attempts for arterial evaluation and/or angiography were not follow through with by patient. He does have a history of being seen in lymphedema clinic in 2018, stopped going approximately 6 months ago stating "it didn't do any good". He does not have lymphedema pumps, he does not have custom fit compression wrap/stockings. He is diabetic and his recent A1c last month was 7.6. He admits to chronic bilateral lower extremity pain, no change in pain since blister and ulceration development. He is currently being treated with Levaquin for bronchitis. He has home health and we will continue. 10/25/17-He is here in follow-up evaluation for bilateral lower extremity  ulcerationssubtle he remains on Levaquin for bronchitis. Right lower extremity with no evidence of drainage or ulceration, persistent left lower extremity ulceration. He states that home health has not been out since his appointment. He went to [facility] on Tuesday, studies revealed: RIGHT ABI 0.9, TBI 0.6 LEFT ABI 1.1, TBI 0.6 with triphasic flow bilaterally. We will continue his same treatment plan. He has been educated on compression therapy and need for elevation. He will benefit from lymphedema pumps 11/01/17-He is here in follow-up evaluation for left lower extremity ulcer. The right lower extremity remains healed. He has home health services, but they have not been out to see the  patient for 2-3 weeks. He states it home health physical therapy changed his dressing yesterday after therapy; he placed Ace wrap compression. We are still waiting for lymphedema pumps, reordered d/t need for company change. 11/08/17-He is here in follow-up evaluation for left lower extremity ulcer. It is improved. Edema is significantly improved with compression therapy. We will continue with same treatment plan and he will follow-up next week. No word regarding lymphedema pumps 11/15/17-He is here in follow-up evaluation for left lower extremity ulcer. He is healed and will be discharged from wound care services. I have reached out to [facility] regarding his lymphedema pumps. They have been unable to reach the patient; the contact number they had with the patient's wife's cell phone and she has not answered any unrecognized calls. Contact should be made today, trial planned for next week; [facility] will continue to follow 11/27/17 on evaluation today patient has multiple blistered areas over the right lower extremity his left lower extremity appears to be doing okay. These blistered areas show signs of no infection which is great news. With that being said he did have some necrotic skin  overlying which was mechanically debrided away with saline and gauze today without complication. Overall post debridement the wounds appear to be doing better but in general his swelling seems to be increased. This is obviously not good news. I think this is what has given rise to the blisters. 12/04/17 on evaluation today patient presents for follow-up concerning his bilateral lower extremity edema in the right lower extremity ulcers. He has been tolerating the dressing changes without complication. With that being said he has had no real issues with the wraps which is also good news. Overall I'm pleased with the progress he's been making. 12/11/17 on evaluation today patient appears to be doing rather well in regard to his right lateral lower extremity ulcer. He's been tolerating the dressing changes without complication. Fortunately there does not appear to be any evidence of infection at this time. Overall I'm pleased with the progress that is being made. Unfortunately he has been in the hospital due to having what sounds to be a stomach virus/flu fortunately that is starting to get better. 12/18/17 on evaluation today patient actually appears to be doing very well in regard to his bilateral lower extremities the Adam Merritt, Adam Merritt. ([id number]) swelling is under fairly good control his lymphedema pumps are still not up and running quite yet. With that being said he does have several areas of opening noted as far as wounds are concerned mainly over the left lower extremity. With that being said I do believe once he gets lymphedema pumps this would least help you mention some the fluid and preventing this from occurring. Hopefully that will be set up soon sleeves are Artie in place at his home he just waiting for the machine. 12/25/17 on evaluation today patient actually appears to be doing excellent in fact all of his ulcers appear to have resolved his legs appear very well. I do think he needs compression  stockings we have discussed this and they are actually going to go to [place] today to elastic therapy to get this fitted for him. I think that is definitely a good thing to do. Readmission: 04/09/18 upon evaluation today this patient is seen for readmission due to bilateral lower extremity lymphedema. He has significant swelling of his extremities especially on the left although the right is also swollen he has weeping from both sides. There are no obvious  open wounds at this point. Fortunately he has been doing fairly well for quite a bit of time since I last saw him. Nonetheless unfortunately this seems to have reopened and is giving quite a bit of trouble. He states this began about a week ago when he first called [place] to get in to be seen. No fevers, chills, nausea, or vomiting noted at this time. He has not been using his lymphedema pumps due to the fact that they won't fit on his leg at this point likewise is also not been using his compression for essentially the same reason. 04/16/18 upon evaluation today patient actually appears to be doing a little better in regard to the fluid in his bilateral lower extremities. With that being said he's had three falls since I saw him last week. He also states that he's been feeling very poorly. I was concerned last week and feel like that the concern is still there as far as the congestion in his chest is concerned he seems to be breathing about the same as last week but again he states he's very weak he's not even able to walk further than from the chair to the door. His wife had to buy a wheelchair just to be able to get them out of the house to get to the appointment today. This has me very concerned. 04/23/18 on evaluation today patient actually appears to be doing much better than last week's evaluation. At that time actually had to transport him to the ER via EMS and he subsequently was admitted for acute pulmonary edema, acute renal failure, and  acute congestive heart failure. Fortunately he is doing much better. Apparently they did dialyze him and were able to take off roughly 35 pounds of fluid. Nonetheless he is feeling much better both in regard to his breathing and he's able to get around much better at this time compared to previous. Overall I'm very happy with how things are at this time. There does not appear to be any evidence of infection currently. No fevers, chills, nausea, or vomiting noted at this time. 04/30/2018 patient seen today for follow-up and management of bilateral lower extremity lymphedema. He did express being more sad today than usual due to the recent loss of his dog. He states that he has been compliant with using the lymphedema pumps. However he does admit a minute over the last 2-3 days he has not been using the pumps due to the recent loss of his dog. At this time there is no drainage or open wounds to his lower extremities. The left leg edema is measuring smaller today. Still has a significant amount of edema on bilateral lower extremities With dry flaky skin. He will be referred to the lymphedema clinic for further management. Will continue 3 layer compression wraps and follow-up in 1-2 weeks.Denies any pain, fever, chairs recently. No recent falls or injuries reported during this visit. 05/07/18 on evaluation today patient actually appears to be doing very well in regard to his lower extremities in general all things considered. With that being said he is having some pain in the legs just due to the amount of swelling. He does have an area where he had a blister on the left lateral lower extremity this is open at this point other than that there's nothing else weeping at this time. 05/14/18 on evaluation today patient actually appears to be doing excellent all things considered in regard to his lower extremities. He still has a couple  areas of weeping on each leg which has continued to be the issue for him.  He does have an appointment with the lymphedema clinic although this isn't until February 2020. That was the earliest they had. In the meantime he has continued to tolerate the compression wraps without complication. 05/28/18 on evaluation today patient actually appears to be doing more poorly in regard to his left lower extremity where he has a wound open at this point. He also had a fall where he subsequently injured his right great toe which has led to an open wound at the site unfortunately. He has been tolerating the dressing changes without complication in general as far as the wraps are concerned that he has not been putting any dressing on the left 1st toe ulcer site. 06/11/18 on evaluation today patient appears to be doing much worse in regard to his bilateral lower extremity ulcers. He has been tolerating the dressing changes without complication although his legs have not been wrapped more recently. Overall I am not very pleased with the way his legs appear. I do believe he needs to be back in compression wraps he still has not received his compression wraps from the [facility] hospital as of yet. Adam Merritt, Adam Merritt ([id number]) 06/18/18 on evaluation today patient actually appears to be doing significantly better than last time I saw him. He has been tolerating the compression wraps without complication in the circumferential ulcers especially appear to be doing much better. His toe ulcer on the right in regard to the great toe is better although not as good as the legs in my pinion. No fevers chills noted 07/02/18 on evaluation today patient appears to be doing much better in regard to his lower extremity ulcers. Unfortunately since I last saw him he's had the distal portion of his right great toe if he dated it sounds as if this actually went downhill very quickly. I had only seen him a few days prior and the toe did not appear to be infected at that point subsequently became infected very rapidly  and it was decided by the surgeon that the distal portion of the toe needed to be removed. The patient seems to be doing well in this regard he tells me. With that being said his lower extremities are doing better from the standpoint of the wounds although he is significantly swollen at this point. 07/09/18 on evaluation today patient appears to be doing better in regard to the wounds on his lower extremities. In fact everything is almost completely healed he is just a small area on the left posterior lower extremity that is open at this point. 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 With that being said he needs some kind of compression it does sound like he may not be wearing his compression, that is the wraps, during the entire time between when he's here visit to visit. Apparently his wife took the current one off because it began to "fall apart". 07/16/18 on evaluation today patient appears to be extremely swollen especially in regard to his left lower extremity unfortunately. He also has a new skin tear over the left lower extremity and there's a smaller area on the right lower extremity as well. Unfortunately this seems to be due in part to blistering and fluid buildup in his leg. He  did get the reduction wraps that were ordered by the [facility] hospital for him to go to lymphedema clinic. With that being said his wounds on the legs have not healed to the point to where they would likely accept them as a patient lymphedema clinic currently. We need to try to get this to heal. With that being said he's been taking his wraps off which is not doing him any favors at this point. In fact this is probably quite counterproductive compared to what needs to occur. We will likely need to increase to a four layer compression wrap  and continue to also utilize elevation and he has to keep the wraps on not take them off as he's been doing currently hasn't had a wrap on since Saturday. 07/23/18 on evaluation today patient appears to be doing much better in regard to his bilateral lower extremities. In fact his left lower extremity which was the largest is actually 15 cm smaller today compared to what it was last time he was here in our clinic. This is obviously good news after just one week. Nonetheless the differences he actually kept the wraps on during the entire week this time. That's not typical for him. I do believe he understands a little bit better now the severity of the situation and why it's important for him to keep these wraps on. 07/30/18 on evaluation today patient actually appears to be doing rather well in regard to his lower extremities. His legs are much smaller than they have been in the past and he actually has only one very small rudder superficial region remaining that is not closed on the left lateral/posterior lower extremity even this is almost completely close. I do believe likely next week he will be healed without any complications. I do think we need to continue the wraps however this seems to be beneficial for him. I also think it may be a good time for [place] to go ahead and see about getting the appointment with the lymphedema clinic which is supposed to be made for him in order to keep this moving along and hopefully get them into compression wraps that will in the end help him to remain healed. 08/06/18 on evaluation today patient actually appears to be doing very well in regard as bilateral lower extremities. In fact his wounds appear to be completely healed at this time. 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 He is in the process of getting appointment with the lymphedema clinic we have made this referral were just waiting to hear back  on the schedule time. We need to follow up on that today as well. 08/13/18 on evaluation today patient actually appears to be doing very well in regard to his bilateral lower extremities there are no open wounds at this point. We are gonna go ahead and see about ordering the Velcro compression wraps for him had a discussion with them about [place] doing it versus the [place] they feel like they can definitely afford going ahead and get the wraps themselves and they would prefer to try to avoid having to go to the lymphedema clinic if it all possible which I completely understand. As long as he has good compression I'm okay either way. 3/17//20 on evaluation today patient actually appears to be doing well in regard to his bilateral lower extremity ulcers. He has been tolerating the dressing changes without complication specifically the compression wraps. Overall is had no issues  my fingers finding that I see at this point is that he is having trouble with constipation. He tells me he has not been able to go to the bathroom for about six days. He's taken to over-the-counter oral laxatives unfortunately this is not helping. He has not contacted his doctor. Adam Merritt, Adam Merritt ([id number]) 08/27/18 on evaluation today patient appears to be doing fairly well in regard to his lower extremities at this point. There does not appear to be any new altars is swelling is very well controlled. We are still waiting for his Velcro compression wraps to arrive that should be sometime in the next week is Artie sent the check for these. 09/03/18 on evaluation today patient appears to be doing excellent in regard to his bilateral lower extremities which he shows no signs of wound openings in regard to this point. He does have his Velcro compression wraps which did arrive in the mail since I saw him last week. Overall he is doing excellent in my pinion. 09/13/18 on evaluation today patient appears to be doing very well currently in  regard to the overall appearance of his bilateral lower extremities although he's a little bit more swollen than last time we saw him. At that point he been discharged without any open wounds. Nonetheless he has a small open wound on the posterior left lower extremity with some evidence of cellulitis noted as well. Fortunately I feel like he has made good progress overall with regard to his lower extremities from were things used to be. Patient History Information obtained from Patient. Family History Cancer - Paternal Grandparents, Kidney Disease - Siblings, Lung Disease - Mother, No family history of Diabetes, Heart Disease, Hereditary Spherocytosis, Hypertension, Seizures, Stroke, Thyroid Problems, Tuberculosis. Social History Never smoker, Marital Status - Married, Alcohol Use - Never, Drug Use - No History, Caffeine Use - Never. Medical History Eyes Denies history of Cataracts, Glaucoma, Optic Neuritis Ear/Nose/Mouth/Throat Denies history of Chronic sinus problems/congestion, Middle ear problems Hematologic/Lymphatic Patient has history of Anemia - aplastic anemia, Lymphedema Denies history of Hemophilia, Human Immunodeficiency Virus, Sickle Cell Disease Respiratory Patient has history of Chronic Obstructive Pulmonary Disease (COPD), Sleep Apnea Denies history of Aspiration, Asthma, Pneumothorax, Tuberculosis Cardiovascular Patient has history of Congestive Heart Failure, Coronary Artery Disease, Hypertension, Peripheral Venous Disease Denies history of Angina, Arrhythmia, Deep Vein Thrombosis, Hypotension, Myocardial Infarction, Peripheral Arterial Disease, Phlebitis, Vasculitis Gastrointestinal Denies history of Cirrhosis , Colitis, Crohn s, Hepatitis A, Hepatitis B, Hepatitis C Endocrine Patient has history of Type II Diabetes Denies history of Type I Diabetes Genitourinary Denies history of End Stage Renal Disease Immunological Denies history of Lupus Erythematosus,  Raynaud s, Scleroderma Integumentary (Skin) Denies history of History of Burn, History of pressure wounds Musculoskeletal Patient has history of Osteoarthritis Denies history of Gout, Rheumatoid Arthritis, Osteomyelitis Neurologic Patient has history of Neuropathy Denies history of Dementia, Quadriplegia, Paraplegia, Seizure Disorder Oncologic BURCH, [place] ([id number]) Patient has history of Received Chemotherapy - last dose 2006 Denies history of Received Radiation Psychiatric Denies history of Anorexia/bulimia, Confinement Anxiety Hospitalization/Surgery History - bronchitis. Medical And Surgical History Notes Respiratory home O2 at night as needed Cardiovascular varicose veins Neurologic CVA - 1992 Review of Systems (ROS) Constitutional Symptoms (General Health) Denies complaints or symptoms of Fatigue, Fever, Chills, Marked Weight Change. Respiratory Denies complaints or symptoms of Chronic or frequent coughs, Shortness of Breath. Cardiovascular Complains or has symptoms of LE edema. Psychiatric Denies complaints or symptoms of Anxiety, Claustrophobia. Objective Constitutional Obese and well-hydrated in no  acute distress. Vitals Time Taken: 10:38 AM, Height: 66 in, Weight: 323 lbs, BMI: 52.1, Temperature: 97.5 F, Pulse: 68 bpm, Respiratory Rate: 18 breaths/min, Blood Pressure: 151/82 mmHg. Respiratory normal breathing without difficulty. clear to auscultation bilaterally. Cardiovascular regular rate and rhythm with normal S1, S2. Psychiatric this patient is able to make decisions and demonstrates good insight into disease process. Alert and Oriented x 3. pleasant and cooperative. General Notes: No sharp debridement was necessary today although I did obtain a culture from the wound site. This will be sent for evaluation and will just anything we need to from the standpoint treatment once this result returns. Integumentary (Hair, Skin) Wound #19 status is Open.  Original cause of wound was Gradually Appeared. The wound is located on the Left,Distal,Posterior Lower Leg. The wound measures 1.8cm length x 2cm width x 0.1cm depth; 2.827cm^2 area and Adam Merritt, Adam Merritt. ([id number]) 0.283cm^3 volume. The wound is limited to skin breakdown. There is no tunneling or undermining noted. There is a medium amount of serous drainage noted. The wound margin is flat and intact. There is no granulation within the wound bed. There is no necrotic tissue within the wound bed. 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 May Shower, gently pat wound dry prior to applying new dressing. Primary Wound Dressing: Wound #19 Left,Distal,Posterior Lower Leg: Silver Alginate Secondary Dressing: Wound #19 Left,Distal,Posterior Lower Leg: Boardered Foam Dressing Dressing Change Frequency: Wound #19 Left,Distal,Posterior Lower Leg: Change dressing every other day. Follow-up Appointments: Wound #19 Left,Distal,Posterior Lower Leg: Return Appointment in 1 week. Edema Control: Wound #19 Left,Distal,Posterior Lower Leg: Patient to wear own Velcro compression garment. Elevate legs to the level of the heart and pump ankles as often as possible Compression Pump: Use compression pump on left lower extremity for 30 minutes, twice daily. - 1 hour, not 30 minutes Compression Pump: Use compression pump on right lower extremity for 30 minutes, twice daily. - 1 hour, not 30 minutes Laboratory ordered were: Wound culture routine The following medication(s) was prescribed: doxycycline hyclate oral 100 mg capsule 1 1  capsule oral taken 2 times a day for 10 days. Do not take multivitamin with this medication starting 09/13/2018 Adam Merritt, Adam Merritt ([id number]) My suggestion currently is gonna be that we go ahead and continue with the above wound care measures for the next week the patient is in agreement with plan. We will subsequently see were things stand at follow-up. If anything changes or worsens in the meantime he will contact the office and let me know. Please see above for specific wound care orders. We will see patient for re-evaluation in 1 week(s) here in the clinic. If anything worsens or changes patient will contact our office for additional recommendations. Electronic Signature(s) Signed: 09/13/2018 12:23:18 PM By: Worthy Keeler PA-C Entered By: Worthy Keeler on 09/13/2018 11:58:39 Adam Merritt ([id number]) -------------------------------------------------------------------------------- ROS/PFSH Details Patient Name: Adam Merritt Date of Service: 09/13/2018 10:30 AM Medical Record Number: [id number] Patient Account Number: [id number] Date of Birth/Sex: September 12, 1945 (73 y.o. M) Treating RN: Montey Hora Primary Care Provider: [name] Other Clinician: Referring Provider: [name] Treating Provider/Extender: Melburn Hake, Durrel Mcnee [name]: 22 Information Obtained From Patient  Constitutional Symptoms (General Health) Complaints and Symptoms: Negative for: Fatigue; Fever; Chills; Marked Weight Change Respiratory Complaints and Symptoms: Negative for: Chronic or frequent coughs; Shortness of Breath Medical History: Positive for: Chronic Obstructive Pulmonary Disease (COPD); Sleep Apnea Negative for: Aspiration; Asthma; Pneumothorax; Tuberculosis Past Medical History Notes: home O2 at night as needed Cardiovascular Complaints and Symptoms: Positive for: LE edema Medical History: Positive for: Congestive Heart Failure; Coronary Artery Disease; Hypertension; Peripheral Venous  Disease Negative for: Angina; Arrhythmia; Deep Vein Thrombosis; Hypotension; Myocardial Infarction; Peripheral Arterial Disease; Phlebitis; Vasculitis Past Medical History Notes: varicose veins Psychiatric Complaints and Symptoms: Negative for: Anxiety; Claustrophobia Medical History: Negative for: Anorexia/bulimia; Confinement Anxiety Eyes Medical History: Negative for: Cataracts; Glaucoma; Optic Neuritis Ear/Nose/Mouth/Throat Medical History: Negative for: Chronic sinus problems/congestion; Middle ear problems Hematologic/Lymphatic Adam Merritt, Adam Merritt ([id number]) Medical History: Positive for: Anemia - aplastic anemia; Lymphedema Negative for: Hemophilia; Human Immunodeficiency Virus; Sickle Cell Disease Gastrointestinal Medical History: Negative for: Cirrhosis ; Colitis; Crohnos; Hepatitis A; Hepatitis B; Hepatitis C Endocrine Medical History: Positive for: Type II Diabetes Negative for: Type I Diabetes Time with diabetes: since 2006 Treated with: Insulin Blood sugar tested every day: Yes Tested : Blood sugar testing results: Breakfast: 209 Genitourinary Medical History: Negative for: End Stage Renal Disease Immunological Medical History: Negative for: Lupus Erythematosus; Raynaudos; Scleroderma Integumentary (Skin) Medical History: Negative for: History of Burn; History of pressure wounds Musculoskeletal Medical History: Positive for: Osteoarthritis Negative for: Gout; Rheumatoid Arthritis; Osteomyelitis Neurologic Medical History: Positive for: Neuropathy Negative for: Dementia; Quadriplegia; Paraplegia; Seizure Disorder Past Medical History Notes: CVA - 1992 Oncologic Medical History: Positive for: Received Chemotherapy - last dose 2006 Negative for: Received Radiation Immunizations Pneumococcal Vaccine: Received Pneumococcal Vaccination: Yes KANNEN, MOXEY ([id number]) Implantable Devices No devices added Hospitalization / Surgery History Type of  Hospitalization/Surgery bronchitis Family and Social History Cancer: Yes - Paternal Grandparents; Diabetes: No; Heart Disease: No; Hereditary Spherocytosis: No; Hypertension: No; Kidney Disease: Yes - Siblings; Lung Disease: Yes - Mother; Seizures: No; Stroke: No; Thyroid Problems: No; Tuberculosis: No; Never smoker; Marital Status - Married; Alcohol Use: Never; Drug Use: No History; Caffeine Use: Never; Financial Concerns: No; Food, Clothing or Shelter Needs: No; Support System Lacking: No; Transportation Concerns: No Physician Affirmation I have reviewed and agree with the above information. Electronic Signature(s) Signed: 09/13/2018 12:23:18 PM By: Worthy Keeler PA-C Signed: 09/13/2018 2:25:55 PM By: Montey Hora Entered By: Worthy Keeler on 09/13/2018 11:56:50 Adam Merritt ([id number]) -------------------------------------------------------------------------------- SuperBill Details Patient Name: Adam Merritt Date of Service: 09/13/2018 Medical Record Number: [id number] Patient Account Number: [id number] Date of Birth/Sex: 1945-11-18 (73 y.o. 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 L98.492 Non-pressure chronic ulcer of skin of other sites with fat layer exposed E66.9 Obesity, unspecified Facility Procedures CPT4 Code: 28786767 Description: 99213 - WOUND CARE VISIT-LEV 3 EST PT Modifier: Quantity: 1 Physician Procedures CPT4 Code Description: 2094709 99214 - WC PHYS LEVEL 4 - EST PT ICD-10 Diagnosis Description I89.0 Lymphedema, not elsewhere classified L97.512  Non-pressure chronic ulcer of other part of right foot with fat L97.822 Non-pressure chronic ulcer of other  part of left lower leg with L98.492 Non-pressure chronic ulcer of skin  of other sites with fat layer Modifier: layer exposed fat layer expos exposed Quantity: 1 ed Electronic Signature(s) Signed: 09/13/2018 12:23:18 PM By: Worthy Keeler PA-C Entered By: Worthy Keeler on 09/13/2018 12:00:14

## 2018-09-16 ENCOUNTER — Encounter: Payer: Self-pay | Admitting: Nurse Practitioner

## 2018-09-16 ENCOUNTER — Ambulatory Visit (INDEPENDENT_AMBULATORY_CARE_PROVIDER_SITE_OTHER): Payer: Medicare Other | Admitting: Nurse Practitioner

## 2018-09-16 ENCOUNTER — Other Ambulatory Visit: Payer: Self-pay

## 2018-09-16 VITALS — BP 136/84 | HR 89 | Resp 16 | Ht 66.0 in | Wt 297.0 lb

## 2018-09-16 DIAGNOSIS — I890 Lymphedema, not elsewhere classified: Secondary | ICD-10-CM | POA: Diagnosis not present

## 2018-09-16 DIAGNOSIS — G894 Chronic pain syndrome: Secondary | ICD-10-CM | POA: Diagnosis not present

## 2018-09-16 DIAGNOSIS — E1165 Type 2 diabetes mellitus with hyperglycemia: Secondary | ICD-10-CM | POA: Diagnosis not present

## 2018-09-16 DIAGNOSIS — I10 Essential (primary) hypertension: Secondary | ICD-10-CM | POA: Diagnosis not present

## 2018-09-16 LAB — POCT GLYCOSYLATED HEMOGLOBIN (HGB A1C): Hemoglobin A1C: 6.5 % — AB (ref 4.0–5.6)

## 2018-09-16 MED ORDER — FENTANYL 62.5 MCG/HR TD PT72: 1.0000 | MEDICATED_PATCH | TRANSDERMAL | 0 refills | Status: DC

## 2018-09-16 NOTE — Progress Notes (Signed)
[facility] [place], [place] [address]  Internal MEDICINE  Office Visit Note  Patient Name: Adam Merritt  [id number]  [id number]  Date of Service: 09/17/2018  Chief Complaint  Patient presents with  . Diabetes  . Hypertension  . Gastroesophageal Reflux    The patient is c/o increased pain in both legs. Currently on fentanyl 42mcg patches, changed every 3 days. Initially working very well, but now, pain in legs and feet is starting to get worse. He states that pain is about 8/10 in severity, can be as low as 5 or 6/10 in severity with the fentanyl patch. Would do better if pain was at level of 3/10 in severity. He is back at wound care for chronic and recurring wounds to bilateral lowe legs. They are currently wrapped in clean dry dressings and pressure bandages, applied per wound clinic. They started him on doxycycline on 09/13/2018, which was first visit back with wound management . His blood pressure is well controlled as are blood sugars. His HgbA1c is improved today at 6.5.       Current Medication: Outpatient Encounter Medications as of 09/16/2018  Medication Sig  . albuterol (PROVENTIL HFA;VENTOLIN HFA) 108 (90 Base) MCG/ACT inhaler INHALE 1 PUFF INTO THE LUNGS EVERY 6 HOURS AS NEEDED FOR WHEEZING OR SHORTNESS OF BREATH (Patient taking differently: Inhale 1 puff into the lungs every 6 (six) hours as needed for wheezing or shortness of breath. )  . aspirin 325 MG EC tablet Take 325 mg by mouth daily.  Marland Kitchen atorvastatin (LIPITOR) 40 MG tablet Take 40 mg by mouth at bedtime.   . carvedilol (COREG) 25 MG tablet Take 25 mg by mouth 2 (two) times daily with a meal.  . doxycycline (VIBRAMYCIN) 100 MG capsule TK ONE C PO BID FOR 10 DAYS. DO NOT TK MULTIVITAMIN WITH THIS MEDICATION  . febuxostat (ULORIC) 40 MG tablet Take 1 tablet (40 mg total) by mouth daily.  . furosemide (LASIX) 40 MG tablet Take 1 tablet (40 mg total) by mouth daily.  Marland Kitchen gabapentin (NEURONTIN)  300 MG capsule Take 1 capsule po in AM and 1 capsule po in pm. Take 2 capsules po at bedtime. (Patient taking differently: Take 2 cap in the morning, 1 cap in the evening and 1 cap at night.)  . hydrALAZINE (APRESOLINE) 25 MG tablet   . indapamide (LOZOL) 2.5 MG tablet Take 1 tablet (2.5 mg total) by mouth daily.  . Insulin Glargine (BASAGLAR KWIKPEN) 100 UNIT/ML SOPN INJECT 30 TO 36 UNITS UNDER THE SKIN EVERY DAY (Patient taking differently: Inject 30 Units into the skin daily. )  . ipratropium-albuterol (DUONEB) 0.5-2.5 (3) MG/3ML SOLN Take 3 mLs by nebulization every 4 (four) hours as needed (for shortness of breath).  . Multiple Vitamin (MULTIVITAMIN WITH MINERALS) TABS tablet Take 1 tablet by mouth daily.  . pentoxifylline (TRENTAL) 400 MG CR tablet TAKE 1 TABLET BY MOUTH THREE TIMES DAILY FOR CLAUDICATION  . potassium chloride SA (K-DUR,KLOR-CON) 20 MEQ tablet Take 2 tablets (40 mEq total) by mouth 2 (two) times daily. (Patient taking differently: Take 20 mEq by mouth 3 (three) times daily. )  . sennosides-docusate sodium (SENOKOT-S) 8.6-50 MG tablet Take 1-2 tablets by mouth 2 (two) times daily.  . traZODone (DESYREL) 50 MG tablet Take 50 mg by mouth at bedtime.  Marland Kitchen VICTOZA 18 MG/3ML SOPN INJECT 1.8MG  INTO THE SKIN DAILY  . [DISCONTINUED] fentaNYL (DURAGESIC) 50 MCG/HR Place 1 patch onto the skin every 3 (three)  days.  . fentaNYL 62.5 MCG/HR PT72 Place 1 patch onto the skin every 3 (three) days.  Marland Kitchen HYDROcodone-acetaminophen (NORCO/VICODIN) 5-325 MG tablet Take 1 tablet by mouth every 6 (six) hours as needed for moderate pain or severe pain. (Patient not taking: Reported on 08/13/2018)  . [DISCONTINUED] zolpidem (AMBIEN) 10 MG tablet Take 1 tablet (10 mg total) by mouth at bedtime as needed for sleep. (Patient not taking: Reported on 09/16/2018)   No facility-administered encounter medications on file as of 09/16/2018.     Surgical History: Past Surgical History:  Procedure Laterality Date  .  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 CHOLECYSTECTOMY    . CORONARY ARTERY BYPASS GRAFT      Medical History: Past Medical History:  Diagnosis Date  . Anemia   . CAD (coronary artery disease)   . Chest pain   . Coronary artery disease   . Diabetes mellitus without complication ([place])   . Diastolic CHF ([place])   . Esophageal reflux   . Herpes zoster without mention of complication   . Hyperlipidemia   . Hypertension   . Insomnia   . Lumbago   . Lymphedema   . Neuropathy in diabetes ([place])   . Obstructive chronic bronchitis without exacerbation ([place])   . Other malaise and fatigue   . Stroke ([place])   . Varicose veins     Family History: Family History  Problem Relation Age of Onset  . Diabetes Mother   . Hyperlipidemia Mother   . Hypertension Mother   . Diabetes Father   . Hyperlipidemia Father   . Hypertension Father   . CAD Father     Social History   Socioeconomic History  . Marital status: Married    Spouse name: Not on file  . Number of children: Not on file  . Years of education: Not on file  . Highest education level: Not on file  Occupational History  . Not on file  Social Needs  . Financial resource strain: Not on file  . Food insecurity:    Worry: Not on file    Inability: Not on file  . Transportation needs:    Medical: Not on file    Non-medical: Not on file  Tobacco Use  . Smoking status: Never Smoker  . Smokeless tobacco: Never Used  Substance and Sexual Activity  . Alcohol use: No    Comment: quit alcohol 30 years ago  . Drug use: No  . Sexual activity: Not on file  Lifestyle  . Physical activity:    Days per week: Not on file    Minutes per session: Not on file  . Stress: Not on file  Relationships  . Social connections:    Talks on phone: Not on file    Gets together: Not on file    Attends religious service: Not on file     Active member of club or organization: Not on file    Attends meetings of clubs or organizations: Not on file    Relationship status: Not on file  . Intimate partner violence:    Fear of current or ex partner: Not on file    Emotionally abused: Not on file    Physically abused: Not on file    Forced sexual activity: Not on file  Other Topics Concern  . Not on file  Social History Narrative  Lives at home with wife, uses a wheelchair now      Review of Systems  Constitutional: Positive for activity change and fatigue.  HENT: Negative for congestion and postnasal drip.   Respiratory: Positive for shortness of breath and wheezing. Negative for cough and chest tightness.   Cardiovascular: Positive for leg swelling. Negative for chest pain and palpitations.  Gastrointestinal: Negative for constipation, diarrhea, nausea and vomiting.  Endocrine: Negative for cold intolerance, heat intolerance, polydipsia and polyuria.       Improving blood sugars.   Musculoskeletal: Positive for arthralgias, back pain and myalgias.  Skin: Positive for rash and wound.  Allergic/Immunologic: Positive for environmental allergies.  Neurological: Negative for dizziness, light-headedness and headaches.  Hematological: Negative for adenopathy.  Psychiatric/Behavioral: Positive for dysphoric mood. Negative for agitation. The patient is nervous/anxious.     Today's Vitals   09/16/18 1048  BP: 136/84  Pulse: 89  Resp: 16  SpO2: 95%  Weight: 297 lb (134.7 kg)  Height: 5\' 6"  (1.676 m)   Body mass index is 47.94 kg/m.   Physical Exam Vitals signs and nursing note reviewed.  Constitutional:      General: He is not in acute distress.    Appearance: He is well-developed. He is obese. He is not diaphoretic.  HENT:     Head: Normocephalic and atraumatic.     Nose: Nose normal.     Mouth/Throat:     Pharynx: No oropharyngeal exudate.  Eyes:     Conjunctiva/sclera: Conjunctivae normal.     Pupils:  Pupils are equal, round, and reactive to light.  Neck:     Musculoskeletal: Normal range of motion and neck supple.     Thyroid: No thyromegaly.     Vascular: No JVD.     Trachea: No tracheal deviation.  Cardiovascular:     Rate and Rhythm: Normal rate and regular rhythm.     Heart sounds: Normal heart sounds. No murmur. No friction rub. No gallop.   Pulmonary:     Effort: Pulmonary effort is normal. No respiratory distress.     Breath sounds: Normal breath sounds. No rales.  Chest:     Chest wall: No tenderness.  Abdominal:     General: Bowel sounds are normal.     Palpations: Abdomen is soft.     Tenderness: There is no abdominal tenderness.  Musculoskeletal: Normal range of motion.     Comments: Moderate to severe lower leg pain, bilaterally. Worse on the right side, especially after he "twisted it" this morning in the bathroom. Pain is severe from just above the heel and into the calf. Hurts to flex and dorsiflex the right foot. Strength is diminished. Having very difficult time putting any weight on the right foot.   Lymphadenopathy:     Cervical: No cervical adenopathy.  Skin:    General: Skin is warm and dry.     Comments: Bilateral lower extremities wrapped in clean dry, pressure dressings, applied per wound management.   Neurological:     Mental Status: He is alert and oriented to person, place, and time. Mental status is at baseline.     Cranial Nerves: No cranial nerve deficit.  Psychiatric:        Behavior: Behavior normal.        Thought Content: Thought content normal.        Judgment: Judgment normal.   Assessment/Plan:  1. Uncontrolled type 2 diabetes mellitus with hyperglycemia (HCC) - POCT HgB A1C 6.5 today. Improved from  most recent visit. Continue diabetic medication as prescribed.   2. Chronic pain disorder Patient reporting worsening pain in both lower legs. Increased dosage of fentanyl patches to 62.42mcg/hour in transdermal patch. A new prescription was  sent to his pharmacy today.  - fentaNYL 62.5 MCG/HR PT72; Place 1 patch onto the skin every 3 (three) days.  Dispense: 10 patch; Refill: 0  3. Lymphedema of both lower extremities Continue regular visits with wound care clinic and vascular specialist as scheduled.   4. Essential hypertension Stable. Continue bp medications as prescribed   General Counseling: Ohm verbalizes understanding of the findings of todays visit and agrees with plan of treatment. I have discussed any further diagnostic evaluation that may be needed or ordered today. We also reviewed his medications today. he has been encouraged to call the office with any questions or concerns that should arise related to todays visit.  Diabetes Counseling:  1. Addition of ACE inh/ ARB'S for nephroprotection. Microalbumin is updated  2. Diabetic foot care, prevention of complications. Podiatry consult 3. Exercise and lose weight.  4. Diabetic eye examination, Diabetic eye exam is updated  5. Monitor blood sugar closlely. nutrition counseling.  6. Sign and symptoms of hypoglycemia including shaking sweating,confusion and headaches.  Reviewed risks and possible side effects associated with taking opiates, benzodiazepines and other CNS depressants. Combination of these could cause dizziness and drowsiness. Advised patient not to drive or operate machinery when taking these medications, as patient's and other's life can be at risk and will have consequences. Patient verbalized understanding in this matter. Dependence and abuse for these drugs will be monitored closely. A Controlled substance policy and procedure is on file which allows [facility] to order a urine drug screen test at any visit. Patient understands and agrees with the plan  This patient was seen by [name] FNP Collaboration with Dr [name] as a part of collaborative care agreement  Orders Placed This Encounter  Procedures  . POCT HgB A1C    Meds  ordered this encounter  Medications  . fentaNYL 62.5 MCG/HR PT72    Sig: Place 1 patch onto the skin every 3 (three) days.    Dispense:  10 patch    Refill:  0    Please note that dosage changed, increased.    Order Specific Question:   Supervising Provider    Answer:   [name] [[id number]]    Time spent: 2 Minutes      Dr [name] Internal medicine

## 2018-09-16 NOTE — Progress Notes (Signed)
Adam Merritt, Adam Merritt ([id number]) Visit Report for 09/13/2018 Arrival Information Details Patient Name: Adam Merritt, Adam Merritt Date of Service: 09/13/2018 10:30 AM Medical Record Number: [id number] Patient Account Number: [id number] Date of Birth/Sex: 01-Oct-1945 (73 y.o. M) Treating RN: Harold Barban Primary Care Morgane Joerger: [name] Other Clinician: Referring Alexis Mizuno: [name] Treating Sriansh Farra/Extender: Melburn Hake, [name]: 56 Visit Information History Since Last Visit Added or deleted any medications: No Patient Arrived: Cane Any new allergies or adverse reactions: No Arrival Time: 10:30 Had a fall or experienced change in No Accompanied By: wife activities of daily living that may affect Transfer Assistance: None risk of falls: Patient Identification Verified: Yes Signs or symptoms of abuse/neglect since last visito No Secondary Verification Process Yes Hospitalized since last visit: No Completed: Has Dressing in Place as Prescribed: Yes Patient Has Alerts: Yes Pain Present Now: No Patient Alerts: Patient on Blood Thinner Aspirin 325mg  Type II Diabetes ABI 5/19 L 1.1 R 0.9 TBI L 0.6 R 0.6 Electronic Signature(s) Signed: 09/16/2018 2:22:43 PM By: Gretta Cool, BSN, RN, CWS, Kim RN, BSN Entered By: Gretta Cool, BSN, RN, CWS, Kim on 09/13/2018 10:37:53 Adam Merritt ([id number]) -------------------------------------------------------------------------------- Clinic Level of Care Assessment Details Patient Name: Adam Merritt Date of Service: 09/13/2018 10:30 AM Medical Record Number: [id number] Patient Account Number: [id number] Date of Birth/Sex: January 02, 1946 (73 y.o. M) Treating RN: Montey Hora Primary Care Brax Walen: [name] Other Clinician: Referring Rosser Collington: [name] Treating Mansi Tokar/Extender: Melburn Hake, [name]: 22 Clinic Level of Care Assessment Items TOOL 4 Quantity Score []  - Use when only an EandM is performed on FOLLOW-UP visit  0 ASSESSMENTS - Nursing Assessment / Reassessment X - Reassessment of Co-morbidities (includes updates in patient status) 1 10 X- 1 5 Reassessment of Adherence to Treatment Plan ASSESSMENTS - Wound and Skin Assessment / Reassessment X - Simple Wound Assessment / Reassessment - one wound 1 5 []  - 0 Complex Wound Assessment / Reassessment - multiple wounds []  - 0 Dermatologic / Skin Assessment (not related to wound area) ASSESSMENTS - Focused Assessment X - Circumferential Edema Measurements - multi extremities 1 5 []  - 0 Nutritional Assessment / Counseling / Intervention X- 1 5 Lower Extremity Assessment (monofilament, tuning fork, pulses) []  - 0 Peripheral Arterial Disease Assessment (using hand held doppler) ASSESSMENTS - Ostomy and/or Continence Assessment and Care []  - Incontinence Assessment and Management 0 []  - 0 Ostomy Care Assessment and Management (repouching, etc.) PROCESS - Coordination of Care X - Simple Patient / Family Education for ongoing care 1 15 []  - 0 Complex (extensive) Patient / Family Education for ongoing care X- 1 10 Staff obtains Programmer, systems, Records, Test Results / Process Orders []  - 0 Staff telephones HHA, Nursing Homes / Clarify orders / etc []  - 0 Routine Transfer to another Facility (non-emergent condition) []  - 0 Routine Hospital Admission (non-emergent condition) []  - 0 New Admissions / Biomedical engineer / Ordering NPWT, Apligraf, etc. []  - 0 Emergency Hospital Admission (emergent condition) X- 1 10 Simple Discharge Coordination Adam Merritt, Adam Merritt ([id number]) []  - 0 Complex (extensive) Discharge Coordination PROCESS - Special Needs []  - Pediatric / Minor Patient Management 0 []  - 0 Isolation Patient Management []  - 0 Hearing / Language / Visual special needs []  - 0 Assessment of Community assistance (transportation, D/C planning, etc.) []  - 0 Additional assistance / Altered mentation []  - 0 Support Surface(s) Assessment (bed,  cushion, seat, etc.) INTERVENTIONS - Wound Cleansing / Measurement X - Simple Wound Cleansing - one wound  1 5 []  - 0 Complex Wound Cleansing - multiple wounds X- 1 5 Wound Imaging (photographs - any number of wounds) []  - 0 Wound Tracing (instead of photographs) X- 1 5 Simple Wound Measurement - one wound []  - 0 Complex Wound Measurement - multiple wounds INTERVENTIONS - Wound Dressings []  - Small Wound Dressing one or multiple wounds 0 X- 1 15 Medium Wound Dressing one or multiple wounds []  - 0 Large Wound Dressing one or multiple wounds []  - 0 Application of Medications - topical []  - 0 Application of Medications - injection INTERVENTIONS - Miscellaneous []  - External ear exam 0 X- 1 5 Specimen Collection (cultures, biopsies, blood, body fluids, etc.) []  - 0 Specimen(s) / Culture(s) sent or taken to Lab for analysis []  - 0 Patient Transfer (multiple staff / Civil Service fast streamer / Similar devices) []  - 0 Simple Staple / Suture removal (25 or less) []  - 0 Complex Staple / Suture removal (26 or more) []  - 0 Hypo / Hyperglycemic Management (close monitor of Blood Glucose) []  - 0 Ankle / Brachial Index (ABI) - do not check if billed separately X- 1 5 Vital Signs Adam Merritt, Adam Merritt ([id number]) Has the patient been seen at the hospital within the last three years: Yes Total Score: 105 Level Of Care: New/Established - Level 3 Electronic Signature(s) Signed: 09/13/2018 2:25:55 PM By: Montey Hora Entered By: Montey Hora on 09/13/2018 11:30:22 Adam Merritt ([id number]) -------------------------------------------------------------------------------- Complex / Palliative Patient Assessment Details Patient Name: Adam Merritt Date of Service: 09/13/2018 10:30 AM Medical Record Number: [id number] Patient Account Number: [id number] Date of Birth/Sex: 1945/07/07 (73 y.o. M) Treating RN: Montey Hora Primary Care Harnoor Reta: [name] Other Clinician: Referring Sherlock Nancarrow: [name] Treating Fallan Mccarey/Extender: Melburn Hake, [name]: 22 Palliative Management Criteria Complex Wound Management Criteria Patient has remarkable or complex co-morbidities requiring medications or treatments that extend wound healing times. Examples: o Diabetes mellitus with chronic renal failure or end stage renal disease requiring dialysis o Advanced or poorly controlled rheumatoid arthritis o Diabetes mellitus and end stage chronic obstructive pulmonary disease o Active cancer with current chemo- or radiation therapy lymphedema, multiple falls, CHF, CAD, PVD, aplastic anemia Care Approach Wound Care Plan: Complex Wound Management Electronic Signature(s) Signed: 09/13/2018 12:23:18 PM By: Worthy Keeler PA-C Signed: 09/13/2018 2:25:55 PM By: Montey Hora Entered By: Montey Hora on 09/13/2018 10:45:41 Adam Merritt ([id number]) -------------------------------------------------------------------------------- Encounter Discharge Information Details Patient Name: Adam Merritt Date of Service: 09/13/2018 10:30 AM Medical Record Number: [id number] Patient Account Number: [id number] Date of Birth/Sex: [birth year] (73 y.o. M) Treating RN: Cornell Barman Primary Care Tammara Massing: [name] Other Clinician: Referring Shawneequa Baldridge: [name] Treating Sherrika Weakland/Extender: Melburn Hake, [name]: 22 Encounter Discharge Information Items Discharge Condition: Stable Ambulatory Status: Cane Discharge Destination: Home Transportation: Private Auto Accompanied By: self Schedule Follow-up Appointment: Yes Clinical Summary of Care: Electronic Signature(s) Signed: 09/16/2018 2:22:43 PM By: Gretta Cool, BSN, RN, CWS, Kim RN, BSN Entered By: Gretta Cool, BSN, RN, CWS, Kim on 09/13/2018 11:12:27 Adam Merritt ([id number]) -------------------------------------------------------------------------------- Lower Extremity Assessment Details Patient Name: Adam Merritt Date  of Service: 09/13/2018 10:30 AM Medical Record Number: [id number] Patient Account Number: [id number] Date of Birth/Sex: Jul 16, 1945 (73 y.o. M) Treating RN: Cornell Barman Primary Care Erian Lariviere: [name] Other Clinician: Referring Florentino Laabs: [name] Treating Jwan Hornbaker/Extender: STONE III, [name]: 22 Edema Assessment Assessed: [Left: No] [Right: No] [Left: Edema] [Right: :] Calf Left: Right: Point of Measurement: 37 cm From Medial Instep  53 cm cm Ankle Left: Right: Point of Measurement: 12 cm From Medial Instep 36.5 cm cm Vascular Assessment Pulses: Dorsalis Pedis Palpable: [Left:Yes] Electronic Signature(s) Signed: 09/16/2018 2:22:43 PM By: Gretta Cool, BSN, RN, CWS, Kim RN, BSN Entered By: Gretta Cool, BSN, RN, CWS, Kim on 09/13/2018 10:42:37 Adam Merritt ([id number]) -------------------------------------------------------------------------------- Multi Wound Chart Details Patient Name: Adam Merritt Date of Service: 09/13/2018 10:30 AM Medical Record Number: [id number] Patient Account Number: [id number] Date of Birth/Sex: Jan 29, 1946 (73 y.o. M) Treating RN: Montey Hora Primary Care Taquisha Phung: [name] Other Clinician: Referring Tayten Heber: [name] Treating Dayani Winbush/Extender: Melburn Hake, [name]: 22 Vital Signs Height(in): 27 Pulse(bpm): 5 Weight(lbs): 323 Blood Pressure(mmHg): 151/82 Body Mass Index(BMI): 52 Temperature(F): 97.5 Respiratory Rate 18 (breaths/min): Photos: [N/A:N/A] Wound Location: Left Lower Leg - Posterior, N/A N/A Distal Wounding Event: Gradually Appeared N/A N/A Primary Etiology: Diabetic Wound/Ulcer of the N/A N/A Lower Extremity Comorbid History: Anemia, Lymphedema, N/A N/A Chronic Obstructive Pulmonary Disease (COPD), Sleep Apnea, Congestive Heart Failure, Coronary Artery Disease, Hypertension, Peripheral Venous Disease, Type II Diabetes, Osteoarthritis, Neuropathy, Received Chemotherapy Date Acquired:  09/12/2018 N/A N/A Weeks of Treatment: 0 N/A N/A Wound Status: Open N/A N/A Measurements L x W x D 1.8x2x0.1 N/A N/A (cm) Area (cm) : 2.827 N/A N/A Volume (cm) : 0.283 N/A N/A % Reduction in Area: 0.00% N/A N/A % Reduction in Volume: 0.00% N/A N/A Classification: Grade 1 N/A N/A Exudate Amount: Medium N/A N/A Exudate Type: Serous N/A N/A Exudate Color: amber N/A N/A Wound Margin: Flat and Intact N/A N/A Adam Merritt, Adam Merritt ([id number]) Granulation Amount: None Present (0%) N/A N/A Necrotic Amount: None Present (0%) N/A N/A Exposed Structures: Fascia: No N/A N/A Fat Layer (Subcutaneous Tissue) Exposed: No Tendon: No Muscle: No Joint: No Bone: No Limited to Skin Breakdown Epithelialization: None N/A N/A Periwound Skin Texture: No Abnormalities Noted N/A N/A Periwound Skin Moisture: No Abnormalities Noted N/A N/A Periwound Skin Color: No Abnormalities Noted N/A N/A Tenderness on Palpation: No N/A N/A Treatment Notes Wound #19 (Left, Distal, Posterior Lower Leg) Notes Silver cell secured with bordered foam dressing, farrow warp applied Electronic Signature(s) Signed: 09/13/2018 11:29:52 AM By: Montey Hora Entered By: Montey Hora on 09/13/2018 11:29:52 Adam Merritt ([id number]) -------------------------------------------------------------------------------- [place] Details Patient Name: Adam Merritt Date of Service: 09/13/2018 10:30 AM Medical Record Number: [id number] Patient Account Number: [id number] Date of Birth/Sex: 05/05/1946 (73 y.o. M) Treating RN: Montey Hora Primary Care Carlynn Leduc: [name] Other Clinician: Referring Ayven Pheasant: [name] Treating Gemma Ruan/Extender: Melburn Hake, [name]: 60 Active Inactive Abuse / Safety / Falls / Self Care Management Nursing Diagnoses: Potential for falls Goals: Patient will remain injury free related to falls Date Initiated: 04/09/2018 Target Resolution Date: 10/05/2018 Goal  Status: Active Interventions: Assess fall risk on admission and as needed Notes: Venous Leg Ulcer Nursing Diagnoses: Actual venous Insuffiency (use after diagnosis is confirmed) Goals: Patient will maintain optimal edema control Date Initiated: 04/09/2018 Target Resolution Date: 10/05/2018 Goal Status: Active Interventions: Assess peripheral edema status every visit. Compression as ordered Provide education on venous insufficiency Notes: Electronic Signature(s) Signed: 09/13/2018 11:29:41 AM By: Montey Hora Entered By: Montey Hora on 09/13/2018 11:29:40 Adam Merritt ([id number]) -------------------------------------------------------------------------------- Pain Assessment Details Patient Name: Adam Merritt Date of Service: 09/13/2018 10:30 AM Medical Record Number: [id number] Patient Account Number: [id number] Date of Birth/Sex: 04/19/1946 (73 y.o. M) Treating RN: Cornell Barman Primary Care Haani Bakula: [name] Other Clinician: Referring Rylend Pietrzak: [name] Treating Shizuo Biskup/Extender: Melburn Hake, [name]  in Treatment: 22 Active Problems Location of Pain Severity and Description of Pain Patient Has Paino No Site Locations Pain Management and Medication Current Pain Management: Electronic Signature(s) Signed: 09/16/2018 2:22:43 PM By: Gretta Cool, BSN, RN, CWS, Kim RN, BSN Entered By: Gretta Cool, BSN, RN, CWS, Kim on 09/13/2018 10:38:03 Adam Merritt ([id number]) -------------------------------------------------------------------------------- Patient/Caregiver Education Details Patient Name: Adam Merritt Date of Service: 09/13/2018 10:30 AM Medical Record Number: [id number] Patient Account Number: [id number] Date of Birth/Gender: 05/12/1946 (73 y.o. M) Treating RN: Montey Hora Primary Care Physician: [name] Other Clinician: Referring Physician: [name] Treating Physician/Extender: Sharalyn Ink in Treatment: 22 Education Assessment Education  Provided To: Patient and Caregiver Education Topics Provided Venous: Handouts: Other: use velcro compression wraps and use pumps twice daily Methods: Demonstration, Explain/Verbal, Printed Responses: State content correctly Electronic Signature(s) Signed: 09/13/2018 2:25:55 PM By: Montey Hora Entered By: Montey Hora on 09/13/2018 11:30:56 Adam Merritt ([id number]) -------------------------------------------------------------------------------- Wound Assessment Details Patient Name: Adam Merritt Date of Service: 09/13/2018 10:30 AM Medical Record Number: [id number] Patient Account Number: [id number] Date of Birth/Sex: Jul 06, 1945 (73 y.o. M) Treating RN: Cornell Barman Primary Care Eyal Greenhaw: [name] Other Clinician: Referring Arval Brandstetter: [name] Treating Darcella Shiffman/Extender: Melburn Hake, [name]: 22 Wound Status Wound Number: 19 Primary Diabetic Wound/Ulcer of the Lower Extremity Etiology: Wound Location: Left Lower Leg - Posterior, Distal Wound Open Wounding Event: Gradually Appeared Status: Date Acquired: 09/12/2018 Comorbid Anemia, Lymphedema, Chronic Obstructive Weeks Of Treatment: 0 History: Pulmonary Disease (COPD), Sleep Apnea, Clustered Wound: No Congestive Heart Failure, Coronary Artery Disease, Hypertension, Peripheral Venous Disease, Type II Diabetes, Osteoarthritis, Neuropathy, Received Chemotherapy Photos Wound Measurements Length: (cm) 1.8 Width: (cm) 2 Depth: (cm) 0.1 Area: (cm) 2.827 Volume: (cm) 0.283 % Reduction in Area: 0% % Reduction in Volume: 0% Epithelialization: None Tunneling: No Undermining: No Wound Description Classification: Grade 1 Foul O Wound Margin: Flat and Intact Slough Exudate Amount: Medium Exudate Type: Serous Exudate Color: amber dor After Cleansing: No /Fibrino No Wound Bed Granulation Amount: None Present (0%) Exposed Structure Necrotic Amount: None Present (0%) Fascia Exposed: No Fat Layer  (Subcutaneous Tissue) Exposed: No Tendon Exposed: No Muscle Exposed: No Joint Exposed: No Bone Exposed: No Adam Merritt, Adam Merritt ([id number]) Limited to Skin Breakdown Periwound Skin Texture Texture Color No Abnormalities Noted: No No Abnormalities Noted: No Moisture No Abnormalities Noted: No Treatment Notes Wound #19 (Left, Distal, Posterior Lower Leg) Notes Silver cell secured with bordered foam dressing, farrow warp applied Electronic Signature(s) Signed: 09/16/2018 2:22:43 PM By: Gretta Cool, BSN, RN, CWS, Kim RN, BSN Entered By: Gretta Cool, BSN, RN, CWS, Kim on 09/13/2018 10:41:28 Adam Merritt ([id number]) -------------------------------------------------------------------------------- [place] Details Patient Name: Adam Merritt Date of Service: 09/13/2018 10:30 AM Medical Record Number: [id number] Patient Account Number: [id number] Date of Birth/Sex: Jan 11, 1946 (73 y.o. M) Treating RN: Cornell Barman Primary Care Roxana Lai: [name] Other Clinician: Referring Dawood Spitler: [name] Treating Hinda Lindor/Extender: Melburn Hake, [name]: 22 Vital Signs Time Taken: 10:38 Temperature (F): 97.5 Height (in): 66 Pulse (bpm): 68 Weight (lbs): 323 Respiratory Rate (breaths/min): 18 Body Mass Index (BMI): 52.1 Blood Pressure (mmHg): 151/82 Reference Range: 80 - 120 mg / dl Electronic Signature(s) Signed: 09/16/2018 2:22:43 PM By: Gretta Cool, BSN, RN, CWS, Kim RN, BSN Entered By: Gretta Cool, BSN, RN, CWS, Kim on 09/13/2018 32:67:12

## 2018-09-17 DIAGNOSIS — E1165 Type 2 diabetes mellitus with hyperglycemia: Secondary | ICD-10-CM | POA: Insufficient documentation

## 2018-09-18 LAB — AEROBIC/ANAEROBIC CULTURE W GRAM STAIN (SURGICAL/DEEP WOUND)

## 2018-09-18 LAB — AEROBIC/ANAEROBIC CULTURE (SURGICAL/DEEP WOUND)

## 2018-09-20 ENCOUNTER — Encounter: Payer: Medicare Other | Admitting: Physician Assistant

## 2018-09-20 ENCOUNTER — Other Ambulatory Visit: Payer: Self-pay

## 2018-09-20 DIAGNOSIS — E11621 Type 2 diabetes mellitus with foot ulcer: Secondary | ICD-10-CM | POA: Diagnosis not present

## 2018-09-20 NOTE — Progress Notes (Signed)
ALOYSIUS, Adam Merritt ([id number]) Visit Report for 09/20/2018 Arrival Information Details Patient Name: Adam Merritt, Adam Merritt Date of Service: 09/20/2018 1:15 PM Medical Record Number: [id number] Patient Account Number: [id number] Date of Birth/Sex: [birth year] (73 y.o. M) Treating RN: Montey Hora Primary Care Evin Chirco: [name] Other Clinician: Referring Ayelen Sciortino: [name] Treating Ruthia Person/Extender: Melburn Hake, [name]: 23 Visit Information History Since Last Visit Added or deleted any medications: No Patient Arrived: Cane Any new allergies or adverse reactions: No Arrival Time: 13:11 Had a fall or experienced change in No Accompanied By: self activities of daily living that may affect Transfer Assistance: None risk of falls: Patient Identification Verified: Yes Signs or symptoms of abuse/neglect since last visito No Secondary Verification Process Yes Hospitalized since last visit: No Completed: Implantable device outside of the clinic excluding No Patient Has Alerts: Yes cellular tissue based products placed in the center Patient Alerts: Patient on Blood since last visit: Thinner Has Dressing in Place as Prescribed: Yes Aspirin 325mg  Has Compression in Place as Prescribed: Yes Type II Diabetes ABI 5/19 L 1.1 R 0.9 Pain Present Now: No TBI L 0.6 R 0.6 Electronic Signature(s) Signed: 09/20/2018 4:04:21 PM By: Montey Hora Entered By: Montey Hora on 09/20/2018 13:12:04 Adam Merritt ([id number]) -------------------------------------------------------------------------------- Clinic Level of Care Assessment Details Patient Name: Adam Merritt Date of Service: 09/20/2018 1:15 PM Medical Record Number: [id number] Patient Account Number: [id number] Date of Birth/Sex: 1946-05-18 (73 y.o. M) Treating RN: Montey Hora Primary Care Culver Feighner: [name] Other Clinician: Referring Michel Eskelson: [name] Treating Ivyrose Hashman/Extender: Melburn Hake, [name] in  Treatment: 23 Clinic Level of Care Assessment Items TOOL 4 Quantity Score []  - Use when only an EandM is performed on FOLLOW-UP visit 0 ASSESSMENTS - Nursing Assessment / Reassessment X - Reassessment of Co-morbidities (includes updates in patient status) 1 10 X- 1 5 Reassessment of Adherence to Treatment Plan ASSESSMENTS - Wound and Skin Assessment / Reassessment X - Simple Wound Assessment / Reassessment - one wound 1 5 []  - 0 Complex Wound Assessment / Reassessment - multiple wounds []  - 0 Dermatologic / Skin Assessment (not related to wound area) ASSESSMENTS - Focused Assessment X - Circumferential Edema Measurements - multi extremities 1 5 []  - 0 Nutritional Assessment / Counseling / Intervention X- 1 5 Lower Extremity Assessment (monofilament, tuning fork, pulses) []  - 0 Peripheral Arterial Disease Assessment (using hand held doppler) ASSESSMENTS - Ostomy and/or Continence Assessment and Care []  - Incontinence Assessment and Management 0 []  - 0 Ostomy Care Assessment and Management (repouching, etc.) PROCESS - Coordination of Care X - Simple Patient / Family Education for ongoing care 1 15 []  - 0 Complex (extensive) Patient / Family Education for ongoing care X- 1 10 Staff obtains Programmer, systems, Records, Test Results / Process Orders []  - 0 Staff telephones HHA, Nursing Homes / Clarify orders / etc []  - 0 Routine Transfer to another Facility (non-emergent condition) []  - 0 Routine Hospital Admission (non-emergent condition) []  - 0 New Admissions / Biomedical engineer / Ordering NPWT, Apligraf, etc. []  - 0 Emergency Hospital Admission (emergent condition) X- 1 10 Simple Discharge Coordination Adam Merritt, Adam Merritt ([id number]) []  - 0 Complex (extensive) Discharge Coordination PROCESS - Special Needs []  - Pediatric / Minor Patient Management 0 []  - 0 Isolation Patient Management []  - 0 Hearing / Language / Visual special needs []  - 0 Assessment of Community  assistance (transportation, D/C planning, etc.) []  - 0 Additional assistance / Altered mentation []  - 0 Support Surface(s) Assessment (  bed, cushion, seat, etc.) INTERVENTIONS - Wound Cleansing / Measurement X - Simple Wound Cleansing - one wound 1 5 []  - 0 Complex Wound Cleansing - multiple wounds X- 1 5 Wound Imaging (photographs - any number of wounds) []  - 0 Wound Tracing (instead of photographs) X- 1 5 Simple Wound Measurement - one wound []  - 0 Complex Wound Measurement - multiple wounds INTERVENTIONS - Wound Dressings X - Small Wound Dressing one or multiple wounds 1 10 []  - 0 Medium Wound Dressing one or multiple wounds []  - 0 Large Wound Dressing one or multiple wounds []  - 0 Application of Medications - topical []  - 0 Application of Medications - injection INTERVENTIONS - Miscellaneous []  - External ear exam 0 []  - 0 Specimen Collection (cultures, biopsies, blood, body fluids, etc.) []  - 0 Specimen(s) / Culture(s) sent or taken to Lab for analysis []  - 0 Patient Transfer (multiple staff / Civil Service fast streamer / Similar devices) []  - 0 Simple Staple / Suture removal (25 or less) []  - 0 Complex Staple / Suture removal (26 or more) []  - 0 Hypo / Hyperglycemic Management (close monitor of Blood Glucose) []  - 0 Ankle / Brachial Index (ABI) - do not check if billed separately X- 1 5 Vital Signs Adam Merritt, Adam Merritt ([id number]) Has the patient been seen at the hospital within the last three years: Yes Total Score: 95 Level Of Care: New/Established - Level 3 Electronic Signature(s) Signed: 09/20/2018 4:04:21 PM By: Montey Hora Entered By: Montey Hora on 09/20/2018 13:27:51 Adam Merritt ([id number]) -------------------------------------------------------------------------------- Encounter Discharge Information Details Patient Name: Adam Merritt Date of Service: 09/20/2018 1:15 PM Medical Record Number: [id number] Patient Account Number: [id number] Date of Birth/Sex:  1946-05-31 (73 y.o. M) Treating RN: Montey Hora Primary Care Quinnton Bury: [name] Other Clinician: Referring Darcey Cardy: [name] Treating Gregory Dowe/Extender: Melburn Hake, [name]: 70 Encounter Discharge Information Items Discharge Condition: Stable Ambulatory Status: Cane Discharge Destination: Home Transportation: Private Auto Accompanied By: self Schedule Follow-up Appointment: Yes Clinical Summary of Care: Provided Form Type Recipient Paper Patient jt Electronic Signature(s) Signed: 09/20/2018 1:40:56 PM By: Lorine Bears RCP, RRT, CHT Entered By: Lorine Bears on 09/20/2018 13:40:56 Adam Merritt ([id number]) -------------------------------------------------------------------------------- Lower Extremity Assessment Details Patient Name: Adam Merritt Date of Service: 09/20/2018 1:15 PM Medical Record Number: [id number] Patient Account Number: [id number] Date of Birth/Sex: 1946/01/23 (73 y.o. M) Treating RN: Montey Hora Primary Care Mohd Clemons: [name] Other Clinician: Referring Mikiyah Glasner: [name] Treating Eyob Godlewski/Extender: Melburn Hake, [name]: 23 Edema Assessment Assessed: [Left: No] [Right: No] Edema: [Left: Ye] [Right: s] Calf Left: Right: Point of Measurement: 37 cm From Medial Instep 52.5 cm cm Ankle Left: Right: Point of Measurement: 12 cm From Medial Instep 34.7 cm cm Vascular Assessment Pulses: Dorsalis Pedis Palpable: [Left:Yes] Electronic Signature(s) Signed: 09/20/2018 4:04:21 PM By: Montey Hora Entered By: Montey Hora on 09/20/2018 13:19:17 Adam Merritt ([id number]) -------------------------------------------------------------------------------- Multi Wound Chart Details Patient Name: Adam Merritt Date of Service: 09/20/2018 1:15 PM Medical Record Number: [id number] Patient Account Number: [id number] Date of Birth/Sex: Aug 07, 1945 (73 y.o. M) Treating RN: Montey Hora Primary Care Roslind Michaux: [name] Other Clinician: Referring Taquana Bartley: [name] Treating Bryona Foxworthy/Extender: Melburn Hake, [name]: 23 Vital Signs Height(in): 66 Pulse(bpm): 75 Weight(lbs): 323 Blood Pressure(mmHg): 128/68 Body Mass Index(BMI): 52 Temperature(F): 98.0 Respiratory Rate 18 (breaths/min): Photos: [N/A:N/A] Wound Location: Left Lower Leg - Posterior, N/A N/A Distal Wounding Event: Gradually Appeared N/A N/A Primary Etiology: Diabetic  Wound/Ulcer of the N/A N/A Lower Extremity Comorbid History: Anemia, Lymphedema, N/A N/A Chronic Obstructive Pulmonary Disease (COPD), Sleep Apnea, Congestive Heart Failure, Coronary Artery Disease, Hypertension, Peripheral Venous Disease, Type II Diabetes, Osteoarthritis, Neuropathy, Received Chemotherapy Date Acquired: 09/12/2018 N/A N/A Weeks of Treatment: 1 N/A N/A Wound Status: Open N/A N/A Measurements L x W x D 1.5x1.9x0.1 N/A N/A (cm) Area (cm) : 2.238 N/A N/A Volume (cm) : 0.224 N/A N/A % Reduction in Area: 20.80% N/A N/A % Reduction in Volume: 20.80% N/A N/A Classification: Grade 1 N/A N/A Exudate Amount: Medium N/A N/A Exudate Type: Serous N/A N/A Exudate Color: amber N/A N/A Wound Margin: Flat and Intact N/A N/A Adam Merritt, Adam Merritt ([id number]) Granulation Amount: Large (67-100%) N/A N/A Granulation Quality: Pink N/A N/A Necrotic Amount: None Present (0%) N/A N/A Exposed Structures: Fascia: No N/A N/A Fat Layer (Subcutaneous Tissue) Exposed: No Tendon: No Muscle: No Joint: No Bone: No Limited to Skin Breakdown Epithelialization: Small (1-33%) N/A N/A Periwound Skin Texture: Excoriation: No N/A N/A Induration: No Callus: No Crepitus: No Rash: No Scarring: No Periwound Skin Moisture: Maceration: No N/A N/A Dry/Scaly: No Periwound Skin Color: Hemosiderin Staining: Yes N/A N/A Atrophie Blanche: No Cyanosis: No Ecchymosis: No Erythema: No Mottled: No Pallor: No Rubor:  No Temperature: No Abnormality N/A N/A Tenderness on Palpation: No N/A N/A Treatment Notes Electronic Signature(s) Signed: 09/20/2018 4:04:21 PM By: Montey Hora Entered By: Montey Hora on 09/20/2018 13:19:30 Adam Merritt ([id number]) -------------------------------------------------------------------------------- [place] Details Patient Name: Adam Merritt Date of Service: 09/20/2018 1:15 PM Medical Record Number: [id number] Patient Account Number: [id number] Date of Birth/Sex: 08-25-1945 (73 y.o. M) Treating RN: Montey Hora Primary Care Kaia Depaolis: [name] Other Clinician: Referring Aimie Wagman: [name] Treating Kingsley Herandez/Extender: Melburn Hake, [name]: 29 Active Inactive Abuse / Safety / Falls / Self Care Management Nursing Diagnoses: Potential for falls Goals: Patient will remain injury free related to falls Date Initiated: 04/09/2018 Target Resolution Date: 10/05/2018 Goal Status: Active Interventions: Assess fall risk on admission and as needed Notes: Venous Leg Ulcer Nursing Diagnoses: Actual venous Insuffiency (use after diagnosis is confirmed) Goals: Patient will maintain optimal edema control Date Initiated: 04/09/2018 Target Resolution Date: 10/05/2018 Goal Status: Active Interventions: Assess peripheral edema status every visit. Compression as ordered Provide education on venous insufficiency Notes: Electronic Signature(s) Signed: 09/20/2018 4:04:21 PM By: Montey Hora Entered By: Montey Hora on 09/20/2018 13:19:24 Adam Merritt ([id number]) -------------------------------------------------------------------------------- Pain Assessment Details Patient Name: Adam Merritt Date of Service: 09/20/2018 1:15 PM Medical Record Number: [id number] Patient Account Number: [id number] Date of Birth/Sex: 10/06/1945 (73 y.o. M) Treating RN: Montey Hora Primary Care Bralee Feldt: [name] Other Clinician: Referring  Darrow Barreiro: [name] Treating Tramain Gershman/Extender: Melburn Hake, [name]: 51 Active Problems Location of Pain Severity and Description of Pain Patient Has Paino No Site Locations Pain Management and Medication Current Pain Management: Electronic Signature(s) Signed: 09/20/2018 4:04:21 PM By: Montey Hora Entered By: Montey Hora on 09/20/2018 13:12:11 Adam Merritt ([id number]) -------------------------------------------------------------------------------- Patient/Caregiver Education Details Patient Name: Adam Merritt Date of Service: 09/20/2018 1:15 PM Medical Record Number: [id number] Patient Account Number: [id number] Date of Birth/Gender: Jan 26, 1946 (73 y.o. M) Treating RN: Montey Hora Primary Care Physician: [name] Other Clinician: Referring Physician: [name] Treating Physician/Extender: Sharalyn Ink in Treatment: 62 Education Assessment Education Provided To: Patient Education Topics Provided Venous: Handouts: Other: continue using your pumps Methods: Explain/Verbal Responses: State content correctly Electronic Signature(s) Signed: 09/20/2018 4:04:21 PM By: Montey Hora Entered  By: Montey Hora on 09/20/2018 13:28:12 Adam Merritt ([id number]) -------------------------------------------------------------------------------- Wound Assessment Details Patient Name: Adam Merritt, Adam Merritt Date of Service: 09/20/2018 1:15 PM Medical Record Number: [id number] Patient Account Number: [id number] Date of Birth/Sex: 05-20-1946 (73 y.o. M) Treating RN: Montey Hora Primary Care Thirza Pellicano: [name] Other Clinician: Referring Brysan Mcevoy: [name] Treating Loan Oguin/Extender: Melburn Hake, [name]: 23 Wound Status Wound Number: 19 Primary Diabetic Wound/Ulcer of the Lower Extremity Etiology: Wound Location: Left Lower Leg - Posterior, Distal Wound Open Wounding Event: Gradually Appeared Status: Date Acquired:  09/12/2018 Comorbid Anemia, Lymphedema, Chronic Obstructive Weeks Of Treatment: 1 History: Pulmonary Disease (COPD), Sleep Apnea, Clustered Wound: No Congestive Heart Failure, Coronary Artery Disease, Hypertension, Peripheral Venous Disease, Type II Diabetes, Osteoarthritis, Neuropathy, Received Chemotherapy Photos Wound Measurements Length: (cm) 1.5 Width: (cm) 1.9 Depth: (cm) 0.1 Area: (cm) 2.238 Volume: (cm) 0.224 % Reduction in Area: 20.8% % Reduction in Volume: 20.8% Epithelialization: Small (1-33%) Tunneling: No Undermining: No Wound Description Classification: Grade 1 Foul O Wound Margin: Flat and Intact Slough Exudate Amount: Medium Exudate Type: Serous Exudate Color: amber dor After Cleansing: No /Fibrino No Wound Bed Granulation Amount: Large (67-100%) Exposed Structure Granulation Quality: Pink Fascia Exposed: No Necrotic Amount: None Present (0%) Fat Layer (Subcutaneous Tissue) Exposed: No Tendon Exposed: No Muscle Exposed: No Joint Exposed: No Bone Exposed: No Adam Merritt, Adam Merritt ([id number]) Limited to Skin Breakdown Periwound Skin Texture Texture Color No Abnormalities Noted: No No Abnormalities Noted: No Callus: No Atrophie Blanche: No Crepitus: No Cyanosis: No Excoriation: No Ecchymosis: No Induration: No Erythema: No Rash: No Hemosiderin Staining: Yes Scarring: No Mottled: No Pallor: No Moisture Rubor: No No Abnormalities Noted: No Dry / Scaly: No Temperature / Pain Maceration: No Temperature: No Abnormality Treatment Notes Wound #19 (Left, Distal, Posterior Lower Leg) Notes Silver cell secured with bordered foam dressing, farrow warp applied Electronic Signature(s) Signed: 09/20/2018 4:04:21 PM By: Montey Hora Entered By: Montey Hora on 09/20/2018 13:18:13 Adam Merritt ([id number]) -------------------------------------------------------------------------------- [place] Details Patient Name: Adam Merritt Date of  Service: 09/20/2018 1:15 PM Medical Record Number: [id number] Patient Account Number: [id number] Date of Birth/Sex: April 01, 1946 (73 y.o. M) Treating RN: Montey Hora Primary Care Kellen Dutch: [name] Other Clinician: Referring Shataya Winkles: [name] Treating Fawna Cranmer/Extender: Melburn Hake, [name]: 23 Vital Signs Time Taken: 13:12 Temperature (F): 98.0 Height (in): 66 Pulse (bpm): 75 Weight (lbs): 323 Respiratory Rate (breaths/min): 18 Body Mass Index (BMI): 52.1 Blood Pressure (mmHg): 128/68 Reference Range: 80 - 120 mg / dl Electronic Signature(s) Signed: 09/20/2018 4:04:21 PM By: Montey Hora Entered By: Montey Hora on 09/20/2018 13:12:41

## 2018-09-20 NOTE — Progress Notes (Signed)
TAFARI, HUMISTON ([id number]) Visit Report for 09/20/2018 Chief Complaint Document Details Patient Name: Adam Merritt, Adam Merritt Date of Service: 09/20/2018 1:15 PM Medical Record Number: [id number] Patient Account Number: [id number] Date of Birth/Sex: 08-22-1945 (73 y.o. M) Treating RN: Montey Hora Primary Care Provider: [name] Other Clinician: Referring Provider: [name] Treating Provider/Extender: Melburn Hake, [name]: 2 Information Obtained from: Patient Chief Complaint he is here for evaluation of bilateral lower extremity lymphedema right right toe and left LE ulcers Electronic Signature(s) Signed: 09/20/2018 4:17:19 PM By: Worthy Keeler PA-C Entered By: Worthy Keeler on 09/20/2018 13:24:25 Adam Merritt ([id number]) -------------------------------------------------------------------------------- HPI Details Patient Name: Adam Merritt Date of Service: 09/20/2018 1:15 PM Medical Record Number: [id number] Patient Account Number: [id number] Date of Birth/Sex: 1946-05-21 (73 y.o. M) Treating RN: Montey Hora Primary Care Provider: [name] Other Clinician: Referring Provider: [name] Treating Provider/Extender: Melburn Hake, [name]: 23 History of Present Illness HPI Description: 10/18/17-He is here for initial evaluation of bilateral lower extremity ulcerations in the presence of venous insufficiency and lymphedema. He has been seen by vascular medicine in the past, Dr. Lucky Cowboy, last seen in 2016. He does have a history of abnormal ABIs, which is to be expected given his lymphedema and venous insufficiency. According to Epic, it appears that all attempts for arterial evaluation and/or angiography were not follow through with by patient. He does have a history of being seen in lymphedema clinic in 2018, stopped going approximately 6 months ago stating "it didn't do any good". He does not have lymphedema pumps, he does not have custom fit  compression wrap/stockings. He is diabetic and his recent A1c last month was 7.6. He admits to chronic bilateral lower extremity pain, no change in pain since blister and ulceration development. He is currently being treated with Levaquin for bronchitis. He has home health and we will continue. 10/25/17-He is here in follow-up evaluation for bilateral lower extremity ulcerationssubtle he remains on Levaquin for bronchitis. Right lower extremity with no evidence of drainage or ulceration, persistent left lower extremity ulceration. He states that home health has not been out since his appointment. He went to [facility] on Tuesday, studies revealed: RIGHT ABI 0.9, TBI 0.6 LEFT ABI 1.1, TBI 0.6 with triphasic flow bilaterally. We will continue his same treatment plan. He has been educated on compression therapy and need for elevation. He will benefit from lymphedema pumps 11/01/17-He is here in follow-up evaluation for left lower extremity ulcer. The right lower extremity remains healed. He has home health services, but they have not been out to see the patient for 2-3 weeks. He states it home health physical therapy changed his dressing yesterday after therapy; he placed Ace wrap compression. We are still waiting for lymphedema pumps, reordered d/t need for company change. 11/08/17-He is here in follow-up evaluation for left lower extremity ulcer. It is improved. Edema is significantly improved with compression therapy. We will continue with same treatment plan and he will follow-up next week. No word regarding lymphedema pumps 11/15/17-He is here in follow-up evaluation for left lower extremity ulcer. He is healed and will be discharged from wound care services. I have reached out to [facility] regarding his lymphedema pumps. They have been unable to reach the patient; the contact number they had with the patient's wife's cell phone and she has not answered any unrecognized  calls. Contact should be made today, trial planned for next week; [facility] will continue to  follow 11/27/17 on evaluation today patient has multiple blistered areas over the right lower extremity his left lower extremity appears to be doing okay. These blistered areas show signs of no infection which is great news. With that being said he did have some necrotic skin overlying which was mechanically debrided away with saline and gauze today without complication. Overall post debridement the wounds appear to be doing better but in general his swelling seems to be increased. This is obviously not good news. I think this is what has given rise to the blisters. 12/04/17 on evaluation today patient presents for follow-up concerning his bilateral lower extremity edema in the right lower extremity ulcers. He has been tolerating the dressing changes without complication. With that being said he has had no real issues with the wraps which is also good news. Overall I'm pleased with the progress he's been making. 12/11/17 on evaluation today patient appears to be doing rather well in regard to his right lateral lower extremity ulcer. He's been tolerating the dressing changes without complication. Fortunately there does not appear to be any evidence of infection at this time. Overall I'm pleased with the progress that is being made. Unfortunately he has been in the hospital due to having what sounds to be a stomach virus/flu fortunately that is starting to get better. 12/18/17 on evaluation today patient actually appears to be doing very well in regard to his bilateral lower extremities the swelling is under fairly good control his lymphedema pumps are still not up and running quite yet. With that being said he does have several areas of opening noted as far as wounds are concerned mainly over the left lower extremity. With that being said I do believe once he gets lymphedema pumps this would least help you  mention some the fluid and preventing this from occurring. Hopefully that will be set up soon sleeves are Artie in place at his home he just waiting for the machine. 12/25/17 on evaluation today patient actually appears to be doing excellent in fact all of his ulcers appear to have resolved his TAAVI, HOOSE. ([id number]) legs appear very well. I do think he needs compression stockings we have discussed this and they are actually going to go to [facility] today to elastic therapy to get this fitted for him. I think that is definitely a good thing to do. Readmission: 04/09/18 upon evaluation today this patient is seen for readmission due to bilateral lower extremity lymphedema. He has significant swelling of his extremities especially on the left although the right is also swollen he has weeping from both sides. There are no obvious open wounds at this point. Fortunately he has been doing fairly well for quite a bit of time since I last saw him. Nonetheless unfortunately this seems to have reopened and is giving quite a bit of trouble. He states this began about a week ago when he first called [place] to get in to be seen. No fevers, chills, nausea, or vomiting noted at this time. He has not been using his lymphedema pumps due to the fact that they won't fit on his leg at this point likewise is also not been using his compression for essentially the same reason. 04/16/18 upon evaluation today patient actually appears to be doing a little better in regard to the fluid in his bilateral lower extremities. With that being said he's had three falls since I saw him last week. He also states that he's been feeling very poorly. I was  concerned last week and feel like that the concern is still there as far as the congestion in his chest is concerned he seems to be breathing about the same as last week but again he states he's very weak he's not even able to walk further than from the chair to the door. His wife had  to buy a wheelchair just to be able to get them out of the house to get to the appointment today. This has me very concerned. 04/23/18 on evaluation today patient actually appears to be doing much better than last week's evaluation. At that time actually had to transport him to the ER via EMS and he subsequently was admitted for acute pulmonary edema, acute renal failure, and acute congestive heart failure. Fortunately he is doing much better. Apparently they did dialyze him and were able to take off roughly 35 pounds of fluid. Nonetheless he is feeling much better both in regard to his breathing and he's able to get around much better at this time compared to previous. Overall I'm very happy with how things are at this time. There does not appear to be any evidence of infection currently. No fevers, chills, nausea, or vomiting noted at this time. 04/30/2018 patient seen today for follow-up and management of bilateral lower extremity lymphedema. He did express being more sad today than usual due to the recent loss of his dog. He states that he has been compliant with using the lymphedema pumps. However he does admit a minute over the last 2-3 days he has not been using the pumps due to the recent loss of his dog. At this time there is no drainage or open wounds to his lower extremities. The left leg edema is measuring smaller today. Still has a significant amount of edema on bilateral lower extremities With dry flaky skin. He will be referred to the lymphedema clinic for further management. Will continue 3 layer compression wraps and follow-up in 1-2 weeks.Denies any pain, fever, chairs recently. No recent falls or injuries reported during this visit. 05/07/18 on evaluation today patient actually appears to be doing very well in regard to his lower extremities in general all things considered. With that being said he is having some pain in the legs just due to the amount of swelling. He does have an  area where he had a blister on the left lateral lower extremity this is open at this point other than that there's nothing else weeping at this time. 05/14/18 on evaluation today patient actually appears to be doing excellent all things considered in regard to his lower extremities. He still has a couple areas of weeping on each leg which has continued to be the issue for him. He does have an appointment with the lymphedema clinic although this isn't until February 2020. That was the earliest they had. In the meantime he has continued to tolerate the compression wraps without complication. 05/28/18 on evaluation today patient actually appears to be doing more poorly in regard to his left lower extremity where he has a wound open at this point. He also had a fall where he subsequently injured his right great toe which has led to an open wound at the site unfortunately. He has been tolerating the dressing changes without complication in general as far as the wraps are concerned that he has not been putting any dressing on the left 1st toe ulcer site. 06/11/18 on evaluation today patient appears to be doing much worse in regard to his  bilateral lower extremity ulcers. He has been tolerating the dressing changes without complication although his legs have not been wrapped more recently. Overall I am not very pleased with the way his legs appear. I do believe he needs to be back in compression wraps he still has not received his compression wraps from the [facility] hospital as of yet. 06/18/18 on evaluation today patient actually appears to be doing significantly better than last time I saw him. He has been tolerating the compression wraps without complication in the circumferential ulcers especially appear to be doing much better. His toe ulcer on the right in regard to the great toe is better although not as good as the legs in my pinion. No fevers chills noted JOMAR, DENZ ([id number]) 07/02/18 on evaluation  today patient appears to be doing much better in regard to his lower extremity ulcers. Unfortunately since I last saw him he's had the distal portion of his right great toe if he dated it sounds as if this actually went downhill very quickly. I had only seen him a few days prior and the toe did not appear to be infected at that point subsequently became infected very rapidly and it was decided by the surgeon that the distal portion of the toe needed to be removed. The patient seems to be doing well in this regard he tells me. With that being said his lower extremities are doing better from the standpoint of the wounds although he is significantly swollen at this point. 07/09/18 on evaluation today patient appears to be doing better in regard to the wounds on his lower extremities. In fact everything is almost completely healed he is just a small area on the left posterior lower extremity that is open at this point. 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 With that being said he needs some kind of compression it does sound like he may not be wearing his compression, that is the wraps, during the entire time between when he's here visit to visit. Apparently his wife took the current one off because it began to "fall apart". 07/16/18 on evaluation today patient appears to be extremely swollen especially in regard to his left lower extremity unfortunately. He also has a new skin tear over the left lower extremity and there's a smaller area on the right lower extremity as well. Unfortunately this seems to be due in part to blistering and fluid buildup in his leg. He did get the reduction wraps that were ordered by the [facility] hospital for him to go to lymphedema clinic. With that being said his wounds on the legs have not healed  to the point to where they would likely accept them as a patient lymphedema clinic currently. We need to try to get this to heal. With that being said he's been taking his wraps off which is not doing him any favors at this point. In fact this is probably quite counterproductive compared to what needs to occur. We will likely need to increase to a four layer compression wrap and continue to also utilize elevation and he has to keep the wraps on not take them off as he's been doing currently hasn't had a wrap on since Saturday. 07/23/18 on evaluation today patient appears to be doing much better in regard to his bilateral lower extremities.  In fact his left lower extremity which was the largest is actually 15 cm smaller today compared to what it was last time he was here in our clinic. This is obviously good news after just one week. Nonetheless the differences he actually kept the wraps on during the entire week this time. That's not typical for him. I do believe he understands a little bit better now the severity of the situation and why it's important for him to keep these wraps on. 07/30/18 on evaluation today patient actually appears to be doing rather well in regard to his lower extremities. His legs are much smaller than they have been in the past and he actually has only one very small rudder superficial region remaining that is not closed on the left lateral/posterior lower extremity even this is almost completely close. I do believe likely next week he will be healed without any complications. I do think we need to continue the wraps however this seems to be beneficial for him. I also think it may be a good time for [place] to go ahead and see about getting the appointment with the lymphedema clinic which is supposed to be made for him in order to keep this moving along and hopefully get them into compression wraps that will in the end help him to remain healed. 08/06/18 on evaluation today patient  actually appears to be doing very well in regard as bilateral lower extremities. In fact his wounds appear to be completely healed at this time. 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 He is in the process of getting appointment with the lymphedema clinic we have made this referral were just waiting to hear back on the schedule time. We need to follow up on that today as well. 08/13/18 on evaluation today patient actually appears to be doing very well in regard to his bilateral lower extremities there are no open wounds at this point. We are gonna go ahead and see about ordering the Velcro compression wraps for him had a discussion with them about [place] doing it versus the [place] they feel like they can definitely afford going ahead and get the wraps themselves and they would prefer to try to avoid having to go to the lymphedema clinic if it all possible which I completely understand. As long as he has good compression I'm okay either way. 3/17//20 on evaluation today patient actually appears to be doing well in regard to his bilateral lower extremity ulcers. He has been tolerating the dressing changes without complication specifically the compression wraps. Overall is had no issues my fingers finding that I see at this point is that he is having trouble with constipation. He tells me he has not been able to go to the bathroom for about six days. He's taken to over-the-counter oral laxatives unfortunately this is not helping. He has not contacted his doctor. 08/27/18 on evaluation today patient appears to be doing fairly well in regard to his lower extremities at this point. There does not appear to be any new altars is swelling is very well controlled. We are still waiting for his Velcro compression wraps to arrive that should be sometime in the next week is Artie sent the check for these. 09/03/18 on evaluation today patient appears to be doing  excellent in regard to his bilateral lower extremities which he shows DONTAE, MINERVA. ([id number]) no signs of wound openings in regard to this point. He does have  his Velcro compression wraps which did arrive in the mail since I saw him last week. Overall he is doing excellent in my pinion. 09/13/18 on evaluation today patient appears to be doing very well currently in regard to the overall appearance of his bilateral lower extremities although he's a little bit more swollen than last time we saw him. At that point he been discharged without any open wounds. Nonetheless he has a small open wound on the posterior left lower extremity with some evidence of cellulitis noted as well. Fortunately I feel like he has made good progress overall with regard to his lower extremities from were things used to be. 09/20/18 on evaluation today patient actually appears to be doing much better. The erythematous lower extremity is improving wound itself which is still open appears to be doing much better as far as but appearance as well as pain is concerned overall very pleased in this regard. There's no signs of active infection at this time. Electronic Signature(s) Signed: 09/20/2018 4:17:19 PM By: Worthy Keeler PA-C Entered By: Worthy Keeler on 09/20/2018 13:54:35 Adam Merritt ([id number]) -------------------------------------------------------------------------------- Physical Exam Details Patient Name: Adam Merritt Date of Service: 09/20/2018 1:15 PM Medical Record Number: [id number] Patient Account Number: [id number] Date of Birth/Sex: August 20, 1945 (73 y.o. M) Treating RN: Montey Hora Primary Care Provider: [name] Other Clinician: Referring Provider: [name] Treating Provider/Extender: STONE III, [name]: 41 Constitutional Well-nourished and well-hydrated in no acute distress. Respiratory normal breathing without difficulty. clear to auscultation  bilaterally. Cardiovascular regular rate and rhythm with normal S1, S2. Psychiatric this patient is able to make decisions and demonstrates good insight into disease process. Alert and Oriented x 3. pleasant and cooperative. Notes Patient's wound bed currently shows evidence of good granulation no sharp debridement was necessary at this point. Fortunately his leg seems to still be doing well from the standpoint of edema I feel like he is under much better control than he was in the past. The compression wrap seem to be helping him and he tells me he is using his lymphedema pumps will today at night. My suggestion at this point is gonna be that we go ahead and continue with the above wound care measures for the next week. The patient is in agreement the plan. We will subsequently see were things stand at follow-up. If anything changes or worsens meantime he will contact the office and let me know. Please see above for specific wound care orders. We will see patient for re-evaluation in 1 week(s) here in the clinic. If anything worsens or changes patient will contact our office for additional recommendations. Electronic Signature(s) Signed: 09/20/2018 4:17:19 PM By: Worthy Keeler PA-C Entered By: Worthy Keeler on 09/20/2018 13:55:31 Adam Merritt ([id number]) -------------------------------------------------------------------------------- Physician Orders Details Patient Name: Adam Merritt Date of Service: 09/20/2018 1:15 PM Medical Record Number: [id number] Patient Account Number: [id number] Date of Birth/Sex: [birth year] (73 y.o. 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 o May Shower, gently pat wound dry prior to applying new dressing. Primary Wound Dressing Wound #19 Left,Distal,Posterior Lower Leg o Silver Alginate Secondary Dressing Wound #19 Left,Distal,Posterior Lower Leg o Boardered Foam Dressing Dressing Change Frequency Wound #19 Left,Distal,Posterior Lower Leg o Change dressing every other day. Follow-up Appointments Wound #19 Left,Distal,Posterior Lower Leg o Return Appointment in 1 week. Edema Control Wound #19 Left,Distal,Posterior Lower Leg o Patient to wear own Velcro compression garment. o Elevate legs to the level of the heart and pump ankles as often as possible o Compression Pump: Use compression pump on left lower extremity for 30 minutes, twice daily. - 1 hour, not 30 minutes o Compression Pump: Use compression pump on right lower extremity for 30 minutes, twice daily. - 1 hour, not 30 minutes Electronic Signature(s) DAMARCO, KEYSOR ([id number]) Signed: 09/20/2018 4:04:21 PM By: Montey Hora Signed: 09/20/2018 4:17:19 PM By: Worthy Keeler PA-C Entered By: Montey Hora on 09/20/2018 13:27:24 Adam Merritt ([id number]) -------------------------------------------------------------------------------- Problem List Details Patient Name: TANIA, PERROTT Date of Service: 09/20/2018 1:15 PM Medical Record Number: [id number] Patient Account Number: [id number] Date of Birth/Sex: 10/07/1945 (73 y.o. M) Treating RN: Montey Hora Primary Care Provider: [name] Other Clinician: Referring Provider: [name] Treating Provider/Extender: Melburn Hake, [name]: 37 Active  Problems ICD-10 Evaluated Encounter Code Description Active Date Today Diagnosis I89.0 Lymphedema, not elsewhere classified 04/09/2018 No Yes L97.512 Non-pressure chronic ulcer of other part of right foot with fat 05/28/2018 No Yes layer exposed L97.822 Non-pressure chronic ulcer of other part of left lower leg with 05/28/2018 No Yes fat layer exposed L98.492 Non-pressure chronic ulcer of skin of other sites with fat layer 06/11/2018 No Yes exposed E66.9 Obesity, unspecified 04/09/2018 No Yes Inactive Problems Resolved Problems ICD-10 Code Description Active Date Resolved Date L03.115 Cellulitis of right lower limb 04/09/2018 04/09/2018 L03.116 Cellulitis of left lower limb 04/09/2018 04/09/2018 Electronic Signature(s) Signed: 09/20/2018 4:17:19 PM By: Worthy Keeler PA-C Entered By: Worthy Keeler on 09/20/2018 13:24:20 Adam Merritt ([id number]) CLANCEY, WELTON ([id number]) -------------------------------------------------------------------------------- Progress Note Details Patient Name: Adam Merritt Date of Service: 09/20/2018 1:15 PM Medical Record Number: [id number] Patient Account Number: [id number] Date of Birth/Sex: [birth year] (73 y.o. M) Treating RN: Montey Hora Primary Care Provider: [name] Other Clinician: Referring Provider: [name] Treating Provider/Extender: Melburn Hake, [name]: 29 Subjective Chief Complaint Information obtained from Patient he is here for evaluation of bilateral lower extremity lymphedema right right toe and left LE ulcers History of Present Illness (HPI) 10/18/17-He is here for initial evaluation of bilateral lower extremity ulcerations in the presence of venous insufficiency and lymphedema. He has been seen by vascular medicine in the past, Dr. Lucky Cowboy, last seen in 2016. He does have a history of abnormal ABIs, which is to be expected given his lymphedema and venous insufficiency. According to Epic, it appears that  all attempts for arterial evaluation and/or angiography were not follow through with by patient. He does have a history of being seen in lymphedema clinic in 2018, stopped going approximately 6 months ago stating "it didn't do any good". He does not have lymphedema pumps, he does not have custom fit compression wrap/stockings. He is diabetic and his recent A1c last month was 7.6. He admits to chronic bilateral lower extremity pain, no change in pain since blister and ulceration development. He is currently being treated with Levaquin for bronchitis. He has home health and we will continue.  10/25/17-He is here in follow-up evaluation for bilateral lower extremity ulcerationssubtle he remains on Levaquin for bronchitis. Right lower extremity with no evidence of drainage or ulceration, persistent left lower extremity ulceration. He states that home health has not been out since his appointment. He went to [facility] on Tuesday, studies revealed: RIGHT ABI 0.9, TBI 0.6 LEFT ABI 1.1, TBI 0.6 with triphasic flow bilaterally. We will continue his same treatment plan. He has been educated on compression therapy and need for elevation. He will benefit from lymphedema pumps 11/01/17-He is here in follow-up evaluation for left lower extremity ulcer. The right lower extremity remains healed. He has home health services, but they have not been out to see the patient for 2-3 weeks. He states it home health physical therapy changed his dressing yesterday after therapy; he placed Ace wrap compression. We are still waiting for lymphedema pumps, reordered d/t need for company change. 11/08/17-He is here in follow-up evaluation for left lower extremity ulcer. It is improved. Edema is significantly improved with compression therapy. We will continue with same treatment plan and he will follow-up next week. No word regarding lymphedema pumps 11/15/17-He is here in follow-up evaluation for left lower  extremity ulcer. He is healed and will be discharged from wound care services. I have reached out to [facility] regarding his lymphedema pumps. They have been unable to reach the patient; the contact number they had with the patient's wife's cell phone and she has not answered any unrecognized calls. Contact should be made today, trial planned for next week; [facility] will continue to follow 11/27/17 on evaluation today patient has multiple blistered areas over the right lower extremity his left lower extremity appears to be doing okay. These blistered areas show signs of no infection which is great news. With that being said he did have some necrotic skin overlying which was mechanically debrided away with saline and gauze today without complication. Overall post debridement the wounds appear to be doing better but in general his swelling seems to be increased. This is obviously not good news. I think this is what has given rise to the blisters. 12/04/17 on evaluation today patient presents for follow-up concerning his bilateral lower extremity edema in the right lower extremity ulcers. He has been tolerating the dressing changes without complication. With that being said he has had no real issues with the wraps which is also good news. Overall I'm pleased with the progress he's been making. 12/11/17 on evaluation today patient appears to be doing rather well in regard to his right lateral lower extremity ulcer. He's been tolerating the dressing changes without complication. Fortunately there does not appear to be any evidence of infection at this time. Overall I'm pleased with the progress that is being made. Unfortunately he has been in the hospital due to having what sounds to be a stomach virus/flu fortunately that is starting to get better. 12/18/17 on evaluation today patient actually appears to be doing very well in regard to his bilateral lower extremities the KADARRIUS, YANKE.  ([id number]) swelling is under fairly good control his lymphedema pumps are still not up and running quite yet. With that being said he does have several areas of opening noted as far as wounds are concerned mainly over the left lower extremity. With that being said I do believe once he gets lymphedema pumps this would least help you mention some the fluid and preventing this from occurring. Hopefully that will be set up soon  sleeves are Artie in place at his home he just waiting for the machine. 12/25/17 on evaluation today patient actually appears to be doing excellent in fact all of his ulcers appear to have resolved his legs appear very well. I do think he needs compression stockings we have discussed this and they are actually going to go to [place] today to elastic therapy to get this fitted for him. I think that is definitely a good thing to do. Readmission: 04/09/18 upon evaluation today this patient is seen for readmission due to bilateral lower extremity lymphedema. He has significant swelling of his extremities especially on the left although the right is also swollen he has weeping from both sides. There are no obvious open wounds at this point. Fortunately he has been doing fairly well for quite a bit of time since I last saw him. Nonetheless unfortunately this seems to have reopened and is giving quite a bit of trouble. He states this began about a week ago when he first called [place] to get in to be seen. No fevers, chills, nausea, or vomiting noted at this time. He has not been using his lymphedema pumps due to the fact that they won't fit on his leg at this point likewise is also not been using his compression for essentially the same reason. 04/16/18 upon evaluation today patient actually appears to be doing a little better in regard to the fluid in his bilateral lower extremities. With that being said he's had three falls since I saw him last week. He also states that he's been feeling  very poorly. I was concerned last week and feel like that the concern is still there as far as the congestion in his chest is concerned he seems to be breathing about the same as last week but again he states he's very weak he's not even able to walk further than from the chair to the door. His wife had to buy a wheelchair just to be able to get them out of the house to get to the appointment today. This has me very concerned. 04/23/18 on evaluation today patient actually appears to be doing much better than last week's evaluation. At that time actually had to transport him to the ER via EMS and he subsequently was admitted for acute pulmonary edema, acute renal failure, and acute congestive heart failure. Fortunately he is doing much better. Apparently they did dialyze him and were able to take off roughly 35 pounds of fluid. Nonetheless he is feeling much better both in regard to his breathing and he's able to get around much better at this time compared to previous. Overall I'm very happy with how things are at this time. There does not appear to be any evidence of infection currently. No fevers, chills, nausea, or vomiting noted at this time. 04/30/2018 patient seen today for follow-up and management of bilateral lower extremity lymphedema. He did express being more sad today than usual due to the recent loss of his dog. He states that he has been compliant with using the lymphedema pumps. However he does admit a minute over the last 2-3 days he has not been using the pumps due to the recent loss of his dog. At this time there is no drainage or open wounds to his lower extremities. The left leg edema is measuring smaller today. Still has a significant amount of edema on bilateral lower extremities With dry flaky skin. He will be referred to the lymphedema clinic for further management.  Will continue 3 layer compression wraps and follow-up in 1-2 weeks.Denies any pain, fever, chairs recently. No  recent falls or injuries reported during this visit. 05/07/18 on evaluation today patient actually appears to be doing very well in regard to his lower extremities in general all things considered. With that being said he is having some pain in the legs just due to the amount of swelling. He does have an area where he had a blister on the left lateral lower extremity this is open at this point other than that there's nothing else weeping at this time. 05/14/18 on evaluation today patient actually appears to be doing excellent all things considered in regard to his lower extremities. He still has a couple areas of weeping on each leg which has continued to be the issue for him. He does have an appointment with the lymphedema clinic although this isn't until February 2020. That was the earliest they had. In the meantime he has continued to tolerate the compression wraps without complication. 05/28/18 on evaluation today patient actually appears to be doing more poorly in regard to his left lower extremity where he has a wound open at this point. He also had a fall where he subsequently injured his right great toe which has led to an open wound at the site unfortunately. He has been tolerating the dressing changes without complication in general as far as the wraps are concerned that he has not been putting any dressing on the left 1st toe ulcer site. 06/11/18 on evaluation today patient appears to be doing much worse in regard to his bilateral lower extremity ulcers. He has been tolerating the dressing changes without complication although his legs have not been wrapped more recently. Overall I am not very pleased with the way his legs appear. I do believe he needs to be back in compression wraps he still has not received his compression wraps from the [facility] hospital as of yet. LARS, JEZIORSKI ([id number]) 06/18/18 on evaluation today patient actually appears to be doing significantly better than last time I  saw him. He has been tolerating the compression wraps without complication in the circumferential ulcers especially appear to be doing much better. His toe ulcer on the right in regard to the great toe is better although not as good as the legs in my pinion. No fevers chills noted 07/02/18 on evaluation today patient appears to be doing much better in regard to his lower extremity ulcers. Unfortunately since I last saw him he's had the distal portion of his right great toe if he dated it sounds as if this actually went downhill very quickly. I had only seen him a few days prior and the toe did not appear to be infected at that point subsequently became infected very rapidly and it was decided by the surgeon that the distal portion of the toe needed to be removed. The patient seems to be doing well in this regard he tells me. With that being said his lower extremities are doing better from the standpoint of the wounds although he is significantly swollen at this point. 07/09/18 on evaluation today patient appears to be doing better in regard to the wounds on his lower extremities. In fact everything is almost completely healed he is just a small area on the left posterior lower extremity that is open at this point. He is actually seeing the doctor tomorrow regarding his toe amputation and possibly having the sutures removed that point until this is complete  he cannot see the lymphedema clinic apparently according to what he is being told. With that being said he needs some kind of compression it does sound like he may not be wearing his compression, that is the wraps, during the entire time between when he's here visit to visit. Apparently his wife took the current one off because it began to "fall apart". 07/16/18 on evaluation today patient appears to be extremely swollen especially in regard to his left lower extremity unfortunately. He also has a new skin tear over the left lower extremity and  there's a smaller area on the right lower extremity as well. Unfortunately this seems to be due in part to blistering and fluid buildup in his leg. He did get the reduction wraps that were ordered by the [facility] hospital for him to go to lymphedema clinic. With that being said his wounds on the legs have not healed to the point to where they would likely accept them as a patient lymphedema clinic currently. We need to try to get this to heal. With that being said he's been taking his wraps off which is not doing him any favors at this point. In fact this is probably quite counterproductive compared to what needs to occur. We will likely need to increase to a four layer compression wrap and continue to also utilize elevation and he has to keep the wraps on not take them off as he's been doing currently hasn't had a wrap on since Saturday. 07/23/18 on evaluation today patient appears to be doing much better in regard to his bilateral lower extremities. In fact his left lower extremity which was the largest is actually 15 cm smaller today compared to what it was last time he was here in our clinic. This is obviously good news after just one week. Nonetheless the differences he actually kept the wraps on during the entire week this time. That's not typical for him. I do believe he understands a little bit better now the severity of the situation and why it's important for him to keep these wraps on. 07/30/18 on evaluation today patient actually appears to be doing rather well in regard to his lower extremities. His legs are much smaller than they have been in the past and he actually has only one very small rudder superficial region remaining that is not closed on the left lateral/posterior lower extremity even this is almost completely close. I do believe likely next week he will be healed without any complications. I do think we need to continue the wraps however this seems to be beneficial for him. I also  think it may be a good time for [place] to go ahead and see about getting the appointment with the lymphedema clinic which is supposed to be made for him in order to keep this moving along and hopefully get them into compression wraps that will in the end help him to remain healed. 08/06/18 on evaluation today patient actually appears to be doing very well in regard as bilateral lower extremities. In fact his wounds appear to be completely healed at this time. 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 He is in the process of getting appointment with the lymphedema clinic we have made this referral were just waiting to hear back on the schedule time. We need to follow up on that today as well. 08/13/18 on evaluation today patient actually appears to be doing very well in regard to  his bilateral lower extremities there are no open wounds at this point. We are gonna go ahead and see about ordering the Velcro compression wraps for him had a discussion with them about [place] doing it versus the [place] they feel like they can definitely afford going ahead and get the wraps themselves and they would prefer to try to avoid having to go to the lymphedema clinic if it all possible which I completely understand. As long as he has good compression I'm okay either way. 3/17//20 on evaluation today patient actually appears to be doing well in regard to his bilateral lower extremity ulcers. He has been tolerating the dressing changes without complication specifically the compression wraps. Overall is had no issues my fingers finding that I see at this point is that he is having trouble with constipation. He tells me he has not been able to go to the bathroom for about six days. He's taken to over-the-counter oral laxatives unfortunately this is not helping. He has not contacted his doctor. ALEJANDRA, BARNA ([id number]) 08/27/18 on evaluation today patient appears to be  doing fairly well in regard to his lower extremities at this point. There does not appear to be any new altars is swelling is very well controlled. We are still waiting for his Velcro compression wraps to arrive that should be sometime in the next week is Artie sent the check for these. 09/03/18 on evaluation today patient appears to be doing excellent in regard to his bilateral lower extremities which he shows no signs of wound openings in regard to this point. He does have his Velcro compression wraps which did arrive in the mail since I saw him last week. Overall he is doing excellent in my pinion. 09/13/18 on evaluation today patient appears to be doing very well currently in regard to the overall appearance of his bilateral lower extremities although he's a little bit more swollen than last time we saw him. At that point he been discharged without any open wounds. Nonetheless he has a small open wound on the posterior left lower extremity with some evidence of cellulitis noted as well. Fortunately I feel like he has made good progress overall with regard to his lower extremities from were things used to be. 09/20/18 on evaluation today patient actually appears to be doing much better. The erythematous lower extremity is improving wound itself which is still open appears to be doing much better as far as but appearance as well as pain is concerned overall very pleased in this regard. There's no signs of active infection at this time. Patient History Information obtained from Patient. Family History Cancer - Paternal Grandparents, Kidney Disease - Siblings, Lung Disease - Mother, No family history of Diabetes, Heart Disease, Hereditary Spherocytosis, Hypertension, Seizures, Stroke, Thyroid Problems, Tuberculosis. Social History Never smoker, Marital Status - Married, Alcohol Use - Never, Drug Use - No History, Caffeine Use - Never. Medical History Eyes Denies history of Cataracts, Glaucoma,  Optic Neuritis Ear/Nose/Mouth/Throat Denies history of Chronic sinus problems/congestion, Middle ear problems Hematologic/Lymphatic Patient has history of Anemia - aplastic anemia, Lymphedema Denies history of Hemophilia, Human Immunodeficiency Virus, Sickle Cell Disease Respiratory Patient has history of Chronic Obstructive Pulmonary Disease (COPD), Sleep Apnea Denies history of Aspiration, Asthma, Pneumothorax, Tuberculosis Cardiovascular Patient has history of Congestive Heart Failure, Coronary Artery Disease, Hypertension, Peripheral Venous Disease Denies history of Angina, Arrhythmia, Deep Vein Thrombosis, Hypotension, Myocardial Infarction, Peripheral Arterial Disease, Phlebitis, Vasculitis Gastrointestinal Denies history of Cirrhosis , Colitis, Crohn s, Hepatitis  A, Hepatitis B, Hepatitis C Endocrine Patient has history of Type II Diabetes Denies history of Type I Diabetes Genitourinary Denies history of End Stage Renal Disease Immunological Denies history of Lupus Erythematosus, Raynaud s, Scleroderma Integumentary (Skin) Denies history of History of Burn, History of pressure wounds Musculoskeletal Patient has history of Osteoarthritis Denies history of Gout, Rheumatoid Arthritis, Osteomyelitis KAREE, CHRISTOPHERSON ([id number]) Neurologic Patient has history of Neuropathy Denies history of Dementia, Quadriplegia, Paraplegia, Seizure Disorder Oncologic Patient has history of Received Chemotherapy - last dose 2006 Denies history of Received Radiation Psychiatric Denies history of Anorexia/bulimia, Confinement Anxiety Hospitalization/Surgery History - bronchitis. Medical And Surgical History Notes Respiratory home O2 at night as needed Cardiovascular varicose veins Neurologic CVA - 1992 Review of Systems (ROS) Constitutional Symptoms (General Health) Denies complaints or symptoms of Fatigue, Fever, Chills, Marked Weight Change. Respiratory Denies complaints or symptoms  of Chronic or frequent coughs, Shortness of Breath. Cardiovascular Complains or has symptoms of LE edema. Psychiatric Denies complaints or symptoms of Anxiety, Claustrophobia. Objective Constitutional Well-nourished and well-hydrated in no acute distress. Vitals Time Taken: 1:12 PM, Height: 66 in, Weight: 323 lbs, BMI: 52.1, Temperature: 98.0 F, Pulse: 75 bpm, Respiratory Rate: 18 breaths/min, Blood Pressure: 128/68 mmHg. Respiratory normal breathing without difficulty. clear to auscultation bilaterally. Cardiovascular regular rate and rhythm with normal S1, S2. Psychiatric this patient is able to make decisions and demonstrates good insight into disease process. Alert and Oriented x 3. pleasant and cooperative. General Notes: Patient's wound bed currently shows evidence of good granulation no sharp debridement was necessary at this point. Fortunately his leg seems to still be doing well from the standpoint of edema I feel like he is under much better WARIS, RODGER. ([id number]) control than he was in the past. The compression wrap seem to be helping him and he tells me he is using his lymphedema pumps will today at night. My suggestion at this point is gonna be that we go ahead and continue with the above wound care measures for the next week. The patient is in agreement the plan. We will subsequently see were things stand at follow-up. If anything changes or worsens meantime he will contact the office and let me know. Please see above for specific wound care orders. We will see patient for re-evaluation in 1 week(s) here in the clinic. If anything worsens or changes patient will contact our office for additional recommendations. Integumentary (Hair, Skin) Wound #19 status is Open. Original cause of wound was Gradually Appeared. The wound is located on the Left,Distal,Posterior Lower Leg. The wound measures 1.5cm length x 1.9cm width x 0.1cm depth; 2.238cm^2 area and 0.224cm^3 volume.  The wound is limited to skin breakdown. There is no tunneling or undermining noted. There is a medium amount of serous drainage noted. The wound margin is flat and intact. There is large (67-100%) pink granulation within the wound bed. There is no necrotic tissue within the wound bed. The periwound skin appearance exhibited: Hemosiderin Staining. The periwound skin appearance did not exhibit: Callus, Crepitus, Excoriation, Induration, Rash, Scarring, Dry/Scaly, Maceration, Atrophie Blanche, Cyanosis, Ecchymosis, Mottled, Pallor, Rubor, Erythema. Periwound temperature was noted as No Abnormality. 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 May Shower, gently pat wound dry prior to applying new dressing. Primary Wound Dressing: Wound #19 Left,Distal,Posterior Lower Leg: Silver Alginate Secondary Dressing: Wound #19 Left,Distal,Posterior Lower Leg: Boardered Foam Dressing Dressing Change Frequency: Wound #19 Left,Distal,Posterior Lower Leg: Change dressing every other day. Follow-up Appointments: Wound #19 Left,Distal,Posterior Lower Leg: Return Appointment in 1 week. Edema Control: Wound #19 Left,Distal,Posterior Lower Leg: Patient to wear own Velcro compression garment. Elevate legs to the level of the heart and pump ankles as often as possible BRADDOCK, SERVELLON. ([id number]) Compression Pump: Use compression pump on left lower extremity for 30 minutes, twice daily. - 1 hour, not 30 minutes Compression Pump: Use compression pump on right lower extremity for 30 minutes, twice daily. - 1 hour, not 30  minutes Patient's wound bed currently shows evidence of good granulation no sharp debridement was necessary at this point. Fortunately his leg seems to still be doing well from the standpoint of edema I feel like he is under much better control than he was in the past. The compression wrap seem to be helping him and he tells me he is using his lymphedema pumps will today at night. My suggestion at this point is gonna be that we go ahead and continue with the above wound care measures for the next week. The patient is in agreement the plan. We will subsequently see were things stand at follow-up. If anything changes or worsens meantime he will contact the office and let me know. Please see above for specific wound care orders. We will see patient for re-evaluation in 1 week(s) here in the clinic. If anything worsens or changes patient will contact our office for additional recommendations. Electronic Signature(s) Signed: 09/20/2018 4:17:19 PM By: Worthy Keeler PA-C Entered By: Worthy Keeler on 09/20/2018 13:57:37 Adam Merritt ([id number]) -------------------------------------------------------------------------------- ROS/PFSH Details Patient Name: Adam Merritt Date of Service: 09/20/2018 1:15 PM Medical Record Number: [id number] Patient Account Number: [id number] Date of Birth/Sex: 06-12-1945 (73 y.o. M) Treating RN: Montey Hora Primary Care Provider: [name] Other Clinician: Referring Provider: [name] Treating Provider/Extender: Melburn Hake, [name]: 80 Information Obtained From Patient Constitutional Symptoms (General Health) Complaints and Symptoms: Negative for: Fatigue; Fever; Chills; Marked Weight Change Respiratory Complaints and Symptoms: Negative for: Chronic or frequent coughs; Shortness of Breath Medical History: Positive for: Chronic Obstructive Pulmonary Disease (COPD); Sleep Apnea Negative for: Aspiration; Asthma; Pneumothorax;  Tuberculosis Past Medical History Notes: home O2 at night as needed Cardiovascular Complaints and Symptoms: Positive for: LE edema Medical History: Positive for: Congestive Heart Failure; Coronary Artery Disease; Hypertension; Peripheral Venous Disease Negative for: Angina; Arrhythmia; Deep Vein Thrombosis; Hypotension; Myocardial Infarction; Peripheral Arterial Disease; Phlebitis; Vasculitis Past Medical History Notes: varicose veins Psychiatric Complaints and Symptoms: Negative for: Anxiety; Claustrophobia Medical History: Negative for: Anorexia/bulimia; Confinement Anxiety Eyes Medical History: Negative for: Cataracts; Glaucoma; Optic Neuritis Ear/Nose/Mouth/Throat Medical History: Negative for: Chronic sinus problems/congestion; Middle ear problems Hematologic/Lymphatic IMRAN, NUON ([id number]) Medical History: Positive for: Anemia - aplastic anemia; Lymphedema Negative for: Hemophilia; Human Immunodeficiency Virus; Sickle Cell Disease Gastrointestinal Medical History: Negative for: Cirrhosis ; Colitis; Crohnos; Hepatitis A; Hepatitis B; Hepatitis C Endocrine Medical History: Positive for: Type II Diabetes Negative for: Type I Diabetes Time with diabetes: since 2006 Treated with: Insulin Blood sugar tested every day: Yes Tested : Blood sugar testing results: Breakfast: 209 Genitourinary Medical History: Negative for: End Stage Renal Disease Immunological Medical History: Negative for: Lupus Erythematosus; Raynaudos; Scleroderma Integumentary (Skin) Medical History: Negative  for: History of Burn; History of pressure wounds Musculoskeletal Medical History: Positive for: Osteoarthritis Negative for: Gout; Rheumatoid Arthritis; Osteomyelitis Neurologic Medical History: Positive for: Neuropathy Negative for: Dementia; Quadriplegia; Paraplegia; Seizure Disorder Past Medical History Notes: CVA - 1992 Oncologic Medical History: Positive for: Received  Chemotherapy - last dose 2006 Negative for: Received Radiation Immunizations Pneumococcal Vaccine: Received Pneumococcal Vaccination: Yes AKBAR, SACRA ([id number]) Implantable Devices No devices added Hospitalization / Surgery History Type of Hospitalization/Surgery bronchitis Family and Social History Cancer: Yes - Paternal Grandparents; Diabetes: No; Heart Disease: No; Hereditary Spherocytosis: No; Hypertension: No; Kidney Disease: Yes - Siblings; Lung Disease: Yes - Mother; Seizures: No; Stroke: No; Thyroid Problems: No; Tuberculosis: No; Never smoker; Marital Status - Married; Alcohol Use: Never; Drug Use: No History; Caffeine Use: Never; Financial Concerns: No; Food, Clothing or Shelter Needs: No; Support System Lacking: No; Transportation Concerns: No Physician Affirmation I have reviewed and agree with the above information. Electronic Signature(s) Signed: 09/20/2018 4:04:21 PM By: Montey Hora Signed: 09/20/2018 4:17:19 PM By: Worthy Keeler PA-C Entered By: Worthy Keeler on 09/20/2018 13:54:55 Adam Merritt ([id number]) -------------------------------------------------------------------------------- SuperBill Details Patient Name: Adam Merritt Date of Service: 09/20/2018 Medical Record Number: [id number] Patient Account Number: [id number] Date of Birth/Sex: 10-25-1945 (73 y.o. M) Treating RN: Montey Hora Primary Care Provider: [name] Other Clinician: Referring Provider: [name] Treating Provider/Extender: Melburn Hake, [name]: 23 Diagnosis Coding ICD-10 Codes Code Description I89.0 Lymphedema, not elsewhere classified L97.512 Non-pressure chronic ulcer of other part of right foot with fat layer exposed L97.822 Non-pressure chronic ulcer of other part of left lower leg with fat layer exposed L98.492 Non-pressure chronic ulcer of skin of other sites with fat layer exposed E66.9 Obesity, unspecified Facility Procedures CPT4 Code:  08657846 Description: 99213 - WOUND CARE VISIT-LEV 3 EST PT Modifier: Quantity: 1 Physician Procedures CPT4 Code Description: 9629528 41324 - WC PHYS LEVEL 4 - EST PT ICD-10 Diagnosis Description I89.0 Lymphedema, not elsewhere classified L97.512 Non-pressure chronic ulcer of other part of right foot with fat L97.822 Non-pressure chronic ulcer of other  part of left lower leg with L98.492 Non-pressure chronic ulcer of skin of other sites with fat layer Modifier: layer exposed fat layer expos exposed Quantity: 1 ed Electronic Signature(s) Signed: 09/20/2018 4:17:19 PM By: Worthy Keeler PA-C Entered By: Worthy Keeler on 09/20/2018 13:57:53

## 2018-09-26 ENCOUNTER — Encounter: Payer: Self-pay | Admitting: Nurse Practitioner

## 2018-09-26 ENCOUNTER — Other Ambulatory Visit: Payer: Self-pay

## 2018-09-26 ENCOUNTER — Ambulatory Visit (INDEPENDENT_AMBULATORY_CARE_PROVIDER_SITE_OTHER): Payer: Medicare Other | Admitting: Nurse Practitioner

## 2018-09-26 DIAGNOSIS — J449 Chronic obstructive pulmonary disease, unspecified: Secondary | ICD-10-CM | POA: Diagnosis not present

## 2018-09-26 DIAGNOSIS — G4733 Obstructive sleep apnea (adult) (pediatric): Secondary | ICD-10-CM | POA: Diagnosis not present

## 2018-09-26 NOTE — Progress Notes (Signed)
[facility] [place], [place] [address]  Internal MEDICINE  Telephone Visit  Patient Name: Adam Merritt  [id number]  [id number]  Date of Service: 10/06/2018  I connected with the patient at 12:48pm by telephone and verified the patients identity using two identifiers.   I discussed the limitations, risks, security and privacy concerns of performing an evaluation and management service by telephone and the availability of in person appointments. I also discussed with the patient that there may be a patient responsible charge related to the service.  The patient expressed understanding and agrees to proceed.    Chief Complaint  Patient presents with  . Telephone Screen    unable to take vitals   . Telephone Assessment  . COPD    follow up    Patient here for follow-up on COPD.  He denies any overt shortness of breath that is out of the ordinary for him.  He still reports some dyspnea on exertion.  He has been using his inhalers and nebulizers as prescribed.  He denies any fever chills shortness of breath or palpitations.  He also denies any recent hospitalizations for his breathing.      Current Medication: Outpatient Encounter Medications as of 09/26/2018  Medication Sig Note  . albuterol (PROVENTIL HFA;VENTOLIN HFA) 108 (90 Base) MCG/ACT inhaler INHALE 1 PUFF INTO THE LUNGS EVERY 6 HOURS AS NEEDED FOR WHEEZING OR SHORTNESS OF BREATH (Patient taking differently: Inhale 1 puff into the lungs every 6 (six) hours as needed for wheezing or shortness of breath. )   . aspirin 325 MG EC tablet Take 325 mg by mouth daily.   Marland Kitchen atorvastatin (LIPITOR) 40 MG tablet Take 40 mg by mouth at bedtime.    . carvedilol (COREG) 25 MG tablet Take 25 mg by mouth 2 (two) times daily with a meal.   . doxycycline (VIBRAMYCIN) 100 MG capsule TK ONE C PO BID FOR 10 DAYS. DO NOT TK MULTIVITAMIN WITH THIS MEDICATION   . febuxostat (ULORIC) 40 MG tablet Take 1 tablet (40 mg total) by  mouth daily.   . fentaNYL 62.5 MCG/HR PT72 Place 1 patch onto the skin every 3 (three) days.   . furosemide (LASIX) 40 MG tablet Take 1 tablet (40 mg total) by mouth daily.   Marland Kitchen gabapentin (NEURONTIN) 300 MG capsule Take 1 capsule po in AM and 1 capsule po in pm. Take 2 capsules po at bedtime. (Patient taking differently: Take 2 cap in the morning, 1 cap in the evening and 1 cap at night.)   . hydrALAZINE (APRESOLINE) 25 MG tablet    . HYDROcodone-acetaminophen (NORCO/VICODIN) 5-325 MG tablet Take 1 tablet by mouth every 6 (six) hours as needed for moderate pain or severe pain.   . indapamide (LOZOL) 2.5 MG tablet Take 1 tablet (2.5 mg total) by mouth daily.   . Insulin Glargine (BASAGLAR KWIKPEN) 100 UNIT/ML SOPN INJECT 30 TO 36 UNITS UNDER THE SKIN EVERY DAY (Patient taking differently: Inject 30 Units into the skin daily. )   . ipratropium-albuterol (DUONEB) 0.5-2.5 (3) MG/3ML SOLN Take 3 mLs by nebulization every 4 (four) hours as needed (for shortness of breath).   . Multiple Vitamin (MULTIVITAMIN WITH MINERALS) TABS tablet Take 1 tablet by mouth daily.   . pentoxifylline (TRENTAL) 400 MG CR tablet TAKE 1 TABLET BY MOUTH THREE TIMES DAILY FOR CLAUDICATION   . potassium chloride SA (K-DUR,KLOR-CON) 20 MEQ tablet Take 2 tablets (40 mEq total) by mouth 2 (two) times  daily. (Patient taking differently: Take 20 mEq by mouth 3 (three) times daily. )   . sennosides-docusate sodium (SENOKOT-S) 8.6-50 MG tablet Take 1-2 tablets by mouth 2 (two) times daily.   . traZODone (DESYREL) 50 MG tablet Take 50 mg by mouth at bedtime.   Marland Kitchen VICTOZA 18 MG/3ML SOPN INJECT 1.8MG  INTO THE SKIN DAILY   . [DISCONTINUED] amoxicillin-clavulanate (AUGMENTIN) 875-125 MG tablet Take 1 tablet by mouth 2 (two) times daily. (Patient not taking: Reported on 04/01/2018)   . [DISCONTINUED] colchicine 0.6 MG tablet Take 1 tablet (0.6 mg total) by mouth daily. (Patient not taking: Reported on 06/13/2018)   . [DISCONTINUED] docusate  sodium (COLACE) 50 MG capsule Take 50 mg by mouth 2 (two) times daily.   . [DISCONTINUED] doxycycline (VIBRA-TABS) 100 MG tablet Take 1 tablet (100 mg total) by mouth 2 (two) times daily. (Patient not taking: Reported on 04/16/2018)   . [DISCONTINUED] fentaNYL (DURAGESIC - DOSED MCG/HR) 50 MCG/HR Place 1 patch (50 mcg total) onto the skin every 3 (three) days. 04/16/2018: Patient currently wearing 1 patch which was applied 04/15/2018  . [DISCONTINUED] fentaNYL (DURAGESIC - DOSED MCG/HR) 50 MCG/HR Place 1 patch (50 mcg total) onto the skin every 3 (three) days.   . [DISCONTINUED] furosemide (LASIX) 20 MG tablet Take 20 mg by mouth daily.    . [DISCONTINUED] furosemide (LASIX) 40 MG tablet Take 1 tablet (40 mg total) by mouth daily.   . [DISCONTINUED] gabapentin (NEURONTIN) 300 MG capsule Take 1 capsule (300 mg total) by mouth 2 (two) times daily.   . [DISCONTINUED] hydrALAZINE (APRESOLINE) 25 MG tablet TAKE 1 TABLET BY MOUTH THREE TIMES DAILY FOR BLOOD PRESSURE (Patient taking differently: Take 25 mg by mouth 3 (three) times daily. )   . [DISCONTINUED] ibuprofen (ADVIL,MOTRIN) 600 MG tablet Take 600 mg by mouth 2 (two) times daily as needed for mild pain or moderate pain.    . [DISCONTINUED] Lancet Devices (ONE TOUCH DELICA LANCING DEV) MISC Use as directed   . [DISCONTINUED] liraglutide (VICTOZA) 18 MG/3ML SOPN Inject 0.3 mLs (1.8 mg total) into the skin daily.   . [DISCONTINUED] liraglutide (VICTOZA) 18 MG/3ML SOPN Inject 0.3 mLs (1.8 mg total) into the skin daily.   . [DISCONTINUED] methylPREDNISolone (MEDROL) 4 MG TBPK tablet Take by mouth as directed for 6 days (Patient not taking: Reported on 04/01/2018)   . [DISCONTINUED] metolazone (ZAROXOLYN) 2.5 MG tablet TAKE 1 TABLET BY MOUTH DAILY   . [DISCONTINUED] metolazone (ZAROXOLYN) 2.5 MG tablet Take 1 tablet (2.5 mg total) by mouth daily.   . [DISCONTINUED] omeprazole (PRILOSEC) 20 MG capsule Take 20 mg by mouth daily.   . [DISCONTINUED] ONETOUCH  DELICA LANCETS 65L MISC Use twice daily diag e11.65   . [DISCONTINUED] Oxycodone HCl 10 MG TABS Half to one tab as needed prn for pain (Patient not taking: Reported on 06/13/2018)   . [DISCONTINUED] pentoxifylline (TRENTAL) 400 MG CR tablet TAKE 1 TABLET BY MOUTH THREE TIMES DAILY FOR CLAUDICATION (Patient taking differently: Take 400 mg by mouth 3 (three) times daily. )   . [DISCONTINUED] potassium chloride SA (K-DUR,KLOR-CON) 20 MEQ tablet Take 20 mEq by mouth 2 (two) times daily.   . [DISCONTINUED] potassium chloride SA (K-DUR,KLOR-CON) 20 MEQ tablet Take 2 tablets (40 mEq total) by mouth 2 (two) times daily.   . [DISCONTINUED] UREA 20 INTENSIVE HYDRATING 20 % cream USE AS DIRECTED 3 TIMES A WEEK (Patient not taking: Reported on 06/21/2018)   . [DISCONTINUED] zolpidem (AMBIEN CR) 12.5 MG CR  tablet Take 1 tablet (12.5 mg total) by mouth at bedtime as needed for sleep.    No facility-administered encounter medications on file as of 09/26/2018.     Surgical History: Past Surgical History:  Procedure Laterality Date  . 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 CHOLECYSTECTOMY    . CORONARY ARTERY BYPASS GRAFT      Medical History: Past Medical History:  Diagnosis Date  . Anemia   . CAD (coronary artery disease)   . Chest pain   . Coronary artery disease   . Diabetes mellitus without complication ([place])   . Diastolic CHF ([place])   . Esophageal reflux   . Herpes zoster without mention of complication   . Hyperlipidemia   . Hypertension   . Insomnia   . Lumbago   . Lymphedema   . Neuropathy in diabetes ([place])   . Obstructive chronic bronchitis without exacerbation ([place])   . Other malaise and fatigue   . Stroke ([place])   . Varicose veins     Family History: Family History  Problem Relation Age of Onset  . Diabetes Mother   . Hyperlipidemia Mother   . Hypertension Mother   .  Diabetes Father   . Hyperlipidemia Father   . Hypertension Father   . CAD Father     Social History   Socioeconomic History  . Marital status: Married    Spouse name: Not on file  . Number of children: Not on file  . Years of education: Not on file  . Highest education level: Not on file  Occupational History  . Not on file  Social Needs  . Financial resource strain: Not on file  . Food insecurity:    Worry: Not on file    Inability: Not on file  . Transportation needs:    Medical: Not on file    Non-medical: Not on file  Tobacco Use  . Smoking status: Never Smoker  . Smokeless tobacco: Never Used  Substance and Sexual Activity  . Alcohol use: No    Comment: quit alcohol 30 years ago  . Drug use: No  . Sexual activity: Not on file  Lifestyle  . Physical activity:    Days per week: Not on file    Minutes per session: Not on file  . Stress: Not on file  Relationships  . Social connections:    Talks on phone: Not on file    Gets together: Not on file    Attends religious service: Not on file    Active member of club or organization: Not on file    Attends meetings of clubs or organizations: Not on file    Relationship status: Not on file  . Intimate partner violence:    Fear of current or ex partner: Not on file    Emotionally abused: Not on file    Physically abused: Not on file    Forced sexual activity: Not on file  Other Topics Concern  . Not on file  Social History Narrative   Lives at home with wife, uses a wheelchair now      Review of Systems  Constitutional: Positive for fatigue. Negative for chills and unexpected weight change.  HENT: Negative for congestion, postnasal drip, rhinorrhea, sneezing and sore throat.   Respiratory: Positive for shortness of breath and wheezing. Negative for cough and chest tightness.  Intermittent and well controlled on current inhalers and respiratory medications. No flares of shortness of breath which are out of  the ordinary.   Cardiovascular: Positive for leg swelling. Negative for chest pain and palpitations.  Gastrointestinal: Negative for abdominal pain, constipation, diarrhea, nausea and vomiting.  Neurological: Negative for tremors.  Hematological: Negative for adenopathy. Does not bruise/bleed easily.  Psychiatric/Behavioral: Behavioral problem: Depression.    Vital Signs: There were no vitals taken for this visit.   Observation/Objective:  The patient is alert and oriented and is answering all questions appropriately. His breathing is no-labored and no cough is appreciated at this time. He is in no acute distress.    Assessment/Plan: 1. Chronic obstructive pulmonary disease, unspecified COPD type ([place]) Stable condition. Continue inhalers and respiratory medications as prescribed.   2. OSA (obstructive sleep apnea) Continue oxygen use/CPAP as prescribed   General Counseling: Dalvin verbalizes understanding of the findings of today's phone visit and agrees with plan of treatment. I have discussed any further diagnostic evaluation that may be needed or ordered today. We also reviewed his medications today. he has been encouraged to call the office with any questions or concerns that should arise related to todays visit.  This patient was seen by [name] FNP Collaboration with Dr [name] as a part of collaborative care agreement  Time spent: 25 Minutes    Dr [name] Internal medicine

## 2018-09-27 ENCOUNTER — Encounter: Payer: Medicare Other | Admitting: Physician Assistant

## 2018-09-27 ENCOUNTER — Other Ambulatory Visit: Payer: Self-pay

## 2018-09-27 DIAGNOSIS — E11621 Type 2 diabetes mellitus with foot ulcer: Secondary | ICD-10-CM | POA: Diagnosis not present

## 2018-09-28 NOTE — Progress Notes (Signed)
DURANTE, VIOLETT ([id number]) Visit Report for 09/27/2018 Arrival Information Details Patient Name: Adam Merritt, Adam Merritt Date of Service: 09/27/2018 1:30 PM Medical Record Number: [id number] Patient Account Number: [id number] Date of Birth/Sex: December 10, 1945 (73 y.o. M) Treating RN: Montey Hora Primary Care Telisha Zawadzki: [name] Other Clinician: Referring Ilamae Geng: [name] Treating Dawnyel Leven/Extender: Melburn Hake, [name]: 24 Visit Information History Since Last Visit Added or deleted any medications: No Patient Arrived: Wheel Chair Any new allergies or adverse reactions: No Arrival Time: 14:06 Had a fall or experienced change in No Accompanied By: self activities of daily living that may affect Transfer Assistance: None risk of falls: Patient Identification Verified: Yes Signs or symptoms of abuse/neglect since last visito No Secondary Verification Process Yes Hospitalized since last visit: No Completed: Implantable device outside of the clinic excluding No Patient Has Alerts: Yes cellular tissue based products placed in the center Patient Alerts: Patient on Blood since last visit: Thinner Has Dressing in Place as Prescribed: Yes Aspirin 325mg  Has Compression in Place as Prescribed: Yes Type II Diabetes ABI 5/19 L 1.1 R 0.9 Pain Present Now: No TBI L 0.6 R 0.6 Electronic Signature(s) Signed: 09/27/2018 4:49:53 PM By: Montey Hora Entered By: Montey Hora on 09/27/2018 14:06:39 Adam Merritt ([id number]) -------------------------------------------------------------------------------- Clinic Level of Care Assessment Details Patient Name: Adam Merritt Date of Service: 09/27/2018 1:30 PM Medical Record Number: [id number] Patient Account Number: [id number] Date of Birth/Sex: June 23, 1945 (73 y.o. M) Treating RN: Montey Hora Primary Care Belen Zwahlen: [name] Other Clinician: Referring Caeli Linehan: [name] Treating Ramona Ruark/Extender: Melburn Hake, [name]  in Treatment: 24 Clinic Level of Care Assessment Items TOOL 4 Quantity Score []  - Use when only an EandM is performed on FOLLOW-UP visit 0 ASSESSMENTS - Nursing Assessment / Reassessment X - Reassessment of Co-morbidities (includes updates in patient status) 1 10 X- 1 5 Reassessment of Adherence to Treatment Plan ASSESSMENTS - Wound and Skin Assessment / Reassessment X - Simple Wound Assessment / Reassessment - one wound 1 5 []  - 0 Complex Wound Assessment / Reassessment - multiple wounds []  - 0 Dermatologic / Skin Assessment (not related to wound area) ASSESSMENTS - Focused Assessment X - Circumferential Edema Measurements - multi extremities 1 5 []  - 0 Nutritional Assessment / Counseling / Intervention X- 1 5 Lower Extremity Assessment (monofilament, tuning fork, pulses) []  - 0 Peripheral Arterial Disease Assessment (using hand held doppler) ASSESSMENTS - Ostomy and/or Continence Assessment and Care []  - Incontinence Assessment and Management 0 []  - 0 Ostomy Care Assessment and Management (repouching, etc.) PROCESS - Coordination of Care X - Simple Patient / Family Education for ongoing care 1 15 []  - 0 Complex (extensive) Patient / Family Education for ongoing care X- 1 10 Staff obtains Programmer, systems, Records, Test Results / Process Orders []  - 0 Staff telephones HHA, Nursing Homes / Clarify orders / etc []  - 0 Routine Transfer to another Facility (non-emergent condition) []  - 0 Routine Hospital Admission (non-emergent condition) []  - 0 New Admissions / Biomedical engineer / Ordering NPWT, Apligraf, etc. []  - 0 Emergency Hospital Admission (emergent condition) X- 1 10 Simple Discharge Coordination Adam Merritt, Adam Merritt ([id number]) []  - 0 Complex (extensive) Discharge Coordination PROCESS - Special Needs []  - Pediatric / Minor Patient Management 0 []  - 0 Isolation Patient Management []  - 0 Hearing / Language / Visual special needs []  - 0 Assessment of Community  assistance (transportation, D/C planning, etc.) []  - 0 Additional assistance / Altered mentation []  - 0 Support Surface(s)  Assessment (bed, cushion, seat, etc.) INTERVENTIONS - Wound Cleansing / Measurement X - Simple Wound Cleansing - one wound 1 5 []  - 0 Complex Wound Cleansing - multiple wounds X- 1 5 Wound Imaging (photographs - any number of wounds) []  - 0 Wound Tracing (instead of photographs) X- 1 5 Simple Wound Measurement - one wound []  - 0 Complex Wound Measurement - multiple wounds INTERVENTIONS - Wound Dressings X - Small Wound Dressing one or multiple wounds 1 10 []  - 0 Medium Wound Dressing one or multiple wounds []  - 0 Large Wound Dressing one or multiple wounds []  - 0 Application of Medications - topical []  - 0 Application of Medications - injection INTERVENTIONS - Miscellaneous []  - External ear exam 0 []  - 0 Specimen Collection (cultures, biopsies, blood, body fluids, etc.) []  - 0 Specimen(s) / Culture(s) sent or taken to Lab for analysis []  - 0 Patient Transfer (multiple staff / Civil Service fast streamer / Similar devices) []  - 0 Simple Staple / Suture removal (25 or less) []  - 0 Complex Staple / Suture removal (26 or more) []  - 0 Hypo / Hyperglycemic Management (close monitor of Blood Glucose) []  - 0 Ankle / Brachial Index (ABI) - do not check if billed separately X- 1 5 Vital Signs Adam Merritt, Adam Merritt ([id number]) Has the patient been seen at the hospital within the last three years: Yes Total Score: 95 Level Of Care: New/Established - Level 3 Electronic Signature(s) Signed: 09/27/2018 4:49:53 PM By: Montey Hora Entered By: Montey Hora on 09/27/2018 14:37:25 Adam Merritt ([id number]) -------------------------------------------------------------------------------- Encounter Discharge Information Details Patient Name: Adam Merritt Date of Service: 09/27/2018 1:30 PM Medical Record Number: [id number] Patient Account Number: [id number] Date of Birth/Sex:  Jul 29, 1945 (73 y.o. M) Treating RN: Montey Hora Primary Care Lakeisha Waldrop: [name] Other Clinician: Referring Otho Michalik: [name] Treating Zuriyah Shatz/Extender: Melburn Hake, [name]: 70 Encounter Discharge Information Items Discharge Condition: Stable Ambulatory Status: Wheelchair Discharge Destination: Home Transportation: Private Auto Accompanied By: self Schedule Follow-up Appointment: Yes Clinical Summary of Care: Electronic Signature(s) Signed: 09/27/2018 4:49:53 PM By: Montey Hora Entered By: Montey Hora on 09/27/2018 14:38:18 Adam Merritt ([id number]) -------------------------------------------------------------------------------- Lower Extremity Assessment Details Patient Name: Adam Merritt Date of Service: 09/27/2018 1:30 PM Medical Record Number: [id number] Patient Account Number: [id number] Date of Birth/Sex: [birth year] (73 y.o. M) Treating RN: Montey Hora Primary Care Jasminemarie Sherrard: [name] Other Clinician: Referring Chirstina Haan: [name] Treating Tashonda Pinkus/Extender: Melburn Hake, [name]: 24 Edema Assessment Assessed: [Left: No] [Right: No] Edema: [Left: Ye] [Right: s] Calf Left: Right: Point of Measurement: 37 cm From Medial Instep 54 cm cm Ankle Left: Right: Point of Measurement: 12 cm From Medial Instep 34 cm cm Vascular Assessment Pulses: Dorsalis Pedis Palpable: [Left:Yes] Electronic Signature(s) Signed: 09/27/2018 4:49:53 PM By: Montey Hora Entered By: Montey Hora on 09/27/2018 14:17:47 Adam Merritt ([id number]) -------------------------------------------------------------------------------- Multi Wound Chart Details Patient Name: Adam Merritt Date of Service: 09/27/2018 1:30 PM Medical Record Number: [id number] Patient Account Number: [id number] Date of Birth/Sex: Feb 06, 1946 (73 y.o. M) Treating RN: Montey Hora Primary Care Lua Feng: [name] Other Clinician: Referring Eual Lindstrom: [name] Treating Jalayla Chrismer/Extender: Melburn Hake, [name]: 24 Vital Signs Height(in): 66 Pulse(bpm): 77 Weight(lbs): 323 Blood Pressure(mmHg): 164/72 Body Mass Index(BMI): 52 Temperature(F): 98.3 Respiratory Rate 20 (breaths/min): Photos: [N/A:N/A] Wound Location: Left Lower Leg - Posterior, N/A N/A Distal Wounding Event: Gradually Appeared N/A N/A Primary Etiology: Diabetic Wound/Ulcer of the N/A N/A Lower Extremity Comorbid History: Anemia, Lymphedema,  N/A N/A Chronic Obstructive Pulmonary Disease (COPD), Sleep Apnea, Congestive Heart Failure, Coronary Artery Disease, Hypertension, Peripheral Venous Disease, Type II Diabetes, Osteoarthritis, Neuropathy, Received Chemotherapy Date Acquired: 09/12/2018 N/A N/A Weeks of Treatment: 2 N/A N/A Wound Status: Open N/A N/A Measurements L x W x D 0.4x0.4x0.1 N/A N/A (cm) Area (cm) : 0.126 N/A N/A Volume (cm) : 0.013 N/A N/A % Reduction in Area: 95.50% N/A N/A % Reduction in Volume: 95.40% N/A N/A Classification: Grade 1 N/A N/A Exudate Amount: Medium N/A N/A Exudate Type: Serous N/A N/A Exudate Color: amber N/A N/A Wound Margin: Flat and Intact N/A N/A Adam Merritt, Adam Merritt ([id number]) Granulation Amount: Large (67-100%) N/A N/A Granulation Quality: Pink N/A N/A Necrotic Amount: None Present (0%) N/A N/A Exposed Structures: Fascia: No N/A N/A Fat Layer (Subcutaneous Tissue) Exposed: No Tendon: No Muscle: No Joint: No Bone: No Limited to Skin Breakdown Epithelialization: Large (67-100%) N/A N/A Periwound Skin Texture: Excoriation: No N/A N/A Induration: No Callus: No Crepitus: No Rash: No Scarring: No Periwound Skin Moisture: Maceration: No N/A N/A Dry/Scaly: No Periwound Skin Color: Hemosiderin Staining: Yes N/A N/A Atrophie Blanche: No Cyanosis: No Ecchymosis: No Erythema: No Mottled: No Pallor: No Rubor: No Temperature: No Abnormality N/A N/A Tenderness on Palpation: No N/A  N/A Treatment Notes Electronic Signature(s) Signed: 09/27/2018 4:49:53 PM By: Montey Hora Entered By: Montey Hora on 09/27/2018 14:35:41 Adam Merritt ([id number]) -------------------------------------------------------------------------------- [place] Details Patient Name: Adam Merritt Date of Service: 09/27/2018 1:30 PM Medical Record Number: [id number] Patient Account Number: [id number] Date of Birth/Sex: 1945/07/25 (73 y.o. M) Treating RN: Montey Hora Primary Care Javon Snee: [name] Other Clinician: Referring Thos Matsumoto: [name] Treating Layaan Mott/Extender: Melburn Hake, [name]: 37 Active Inactive Abuse / Safety / Falls / Self Care Management Nursing Diagnoses: Potential for falls Goals: Patient will remain injury free related to falls Date Initiated: 04/09/2018 Target Resolution Date: 10/05/2018 Goal Status: Active Interventions: Assess fall risk on admission and as needed Notes: Venous Leg Ulcer Nursing Diagnoses: Actual venous Insuffiency (use after diagnosis is confirmed) Goals: Patient will maintain optimal edema control Date Initiated: 04/09/2018 Target Resolution Date: 10/05/2018 Goal Status: Active Interventions: Assess peripheral edema status every visit. Compression as ordered Provide education on venous insufficiency Notes: Electronic Signature(s) Signed: 09/27/2018 4:49:53 PM By: Montey Hora Entered By: Montey Hora on 09/27/2018 14:35:34 Adam Merritt ([id number]) -------------------------------------------------------------------------------- Pain Assessment Details Patient Name: Adam Merritt Date of Service: 09/27/2018 1:30 PM Medical Record Number: [id number] Patient Account Number: [id number] Date of Birth/Sex: Jun 05, 1946 (73 y.o. M) Treating RN: Montey Hora Primary Care Paxtyn Wisdom: [name] Other Clinician: Referring Favio Moder: [name] Treating Briony Parveen/Extender: Melburn Hake, [name]  in Treatment: 24 Active Problems Location of Pain Severity and Description of Pain Patient Has Paino No Site Locations Pain Management and Medication Current Pain Management: Electronic Signature(s) Signed: 09/27/2018 4:49:53 PM By: Montey Hora Entered By: Montey Hora on 09/27/2018 14:06:44 Adam Merritt ([id number]) -------------------------------------------------------------------------------- Patient/Caregiver Education Details Patient Name: Adam Merritt Date of Service: 09/27/2018 1:30 PM Medical Record Number: [id number] Patient Account Number: [id number] Date of Birth/Gender: 03/06/1946 (73 y.o. M) Treating RN: Montey Hora Primary Care Physician: [name] Other Clinician: Referring Physician: [name] Treating Physician/Extender: Sharalyn Ink in Treatment: 27 Education Assessment Education Provided To: Patient Education Topics Provided Venous: Handouts: Other: continue using pumps Methods: Explain/Verbal Responses: State content correctly Electronic Signature(s) Signed: 09/27/2018 4:49:53 PM By: Montey Hora Entered By: Montey Hora on 09/27/2018 14:37:49 Adam Merritt ([id number]) -------------------------------------------------------------------------------- Wound  Assessment Details Patient Name: Adam Merritt, Adam Merritt Date of Service: 09/27/2018 1:30 PM Medical Record Number: [id number] Patient Account Number: [id number] Date of Birth/Sex: 02/17/1946 (73 y.o. M) Treating RN: Montey Hora Primary Care Arshi Duarte: [name] Other Clinician: Referring Cherly Erno: [name] Treating Carrin Vannostrand/Extender: Melburn Hake, [name]: 24 Wound Status Wound Number: 19 Primary Diabetic Wound/Ulcer of the Lower Extremity Etiology: Wound Location: Left, Distal, Posterior Lower Leg Wound Open Wounding Event: Gradually Appeared Status: Date Acquired: 09/12/2018 Comorbid Anemia, Lymphedema, Chronic Obstructive Weeks Of Treatment: 2 History:  Pulmonary Disease (COPD), Sleep Apnea, Clustered Wound: No Congestive Heart Failure, Coronary Artery Disease, Hypertension, Peripheral Venous Disease, Type II Diabetes, Osteoarthritis, Neuropathy, Received Chemotherapy Photos Wound Measurements Length: (cm) 0.1 Width: (cm) 0.1 Depth: (cm) 0.1 Area: (cm) 0.008 Volume: (cm) 0.001 % Reduction in Area: 99.7% % Reduction in Volume: 99.6% Epithelialization: Large (67-100%) Tunneling: No Undermining: No Wound Description Classification: Grade 1 Foul Od Wound Margin: Flat and Intact Slough/ Exudate Amount: Medium Exudate Type: Serous Exudate Color: amber or After Cleansing: No Fibrino No Wound Bed Granulation Amount: Large (67-100%) Exposed Structure Granulation Quality: Pink Fascia Exposed: No Necrotic Amount: None Present (0%) Fat Layer (Subcutaneous Tissue) Exposed: No Tendon Exposed: No Muscle Exposed: No Joint Exposed: No Bone Exposed: No Adam Merritt, Adam Merritt ([id number]) Limited to Skin Breakdown Periwound Skin Texture Texture Color No Abnormalities Noted: No No Abnormalities Noted: No Callus: No Atrophie Blanche: No Crepitus: No Cyanosis: No Excoriation: No Ecchymosis: No Induration: No Erythema: No Rash: No Hemosiderin Staining: Yes Scarring: No Mottled: No Pallor: No Moisture Rubor: No No Abnormalities Noted: No Dry / Scaly: No Temperature / Pain Maceration: No Temperature: No Abnormality Treatment Notes Wound #19 (Left, Distal, Posterior Lower Leg) Notes Silver cell secured with bordered foam dressing, farrow warp applied Electronic Signature(s) Signed: 09/27/2018 4:49:53 PM By: Montey Hora Entered By: Montey Hora on 09/27/2018 14:36:29 Adam Merritt ([id number]) -------------------------------------------------------------------------------- [place] Details Patient Name: Adam Merritt Date of Service: 09/27/2018 1:30 PM Medical Record Number: [id number] Patient Account Number:  [id number] Date of Birth/Sex: 07-10-1945 (73 y.o. M) Treating RN: Montey Hora Primary Care Raeshawn Tafolla: [name] Other Clinician: Referring Karianna Gusman: [name] Treating Analiz Tvedt/Extender: Melburn Hake, [name]: 24 Vital Signs Time Taken: 14:06 Temperature (F): 98.3 Height (in): 66 Pulse (bpm): 77 Weight (lbs): 323 Respiratory Rate (breaths/min): 20 Body Mass Index (BMI): 52.1 Blood Pressure (mmHg): 164/72 Reference Range: 80 - 120 mg / dl Electronic Signature(s) Signed: 09/27/2018 4:49:53 PM By: Montey Hora Entered By: Montey Hora on 09/27/2018 14:12:36

## 2018-09-28 NOTE — Progress Notes (Signed)
BORDEN, THUNE ([id number]) Visit Report for 09/27/2018 Chief Complaint Document Details Patient Name: Adam Merritt, Adam Merritt Date of Service: 09/27/2018 1:30 PM Medical Record Number: [id number] Patient Account Number: [id number] Date of Birth/Sex: 11/15/1945 (73 y.o. M) Treating RN: Montey Hora Primary Care Provider: [name] Other Clinician: Referring Provider: [name] Treating Provider/Extender: Melburn Hake, Aneth Schlagel [name]: 24 Information Obtained from: Patient Chief Complaint he is here for evaluation of bilateral lower extremity lymphedema right right toe and left LE ulcers Electronic Signature(s) Signed: 09/27/2018 4:48:18 PM By: Worthy Keeler PA-C Entered By: Worthy Keeler on 09/27/2018 14:27:16 Adam Merritt ([id number]) -------------------------------------------------------------------------------- HPI Details Patient Name: Adam Merritt Date of Service: 09/27/2018 1:30 PM Medical Record Number: [id number] Patient Account Number: [id number] Date of Birth/Sex: 18-Dec-1945 (73 y.o. M) Treating RN: Montey Hora Primary Care Provider: [name] Other Clinician: Referring Provider: [name] Treating Provider/Extender: Melburn Hake, Kielee Care [name]: 24 History of Present Illness HPI Description: 10/18/17-He is here for initial evaluation of bilateral lower extremity ulcerations in the presence of venous insufficiency and lymphedema. He has been seen by vascular medicine in the past, Dr. Lucky Cowboy, last seen in 2016. He does have a history of abnormal ABIs, which is to be expected given his lymphedema and venous insufficiency. According to Epic, it appears that all attempts for arterial evaluation and/or angiography were not follow through with by patient. He does have a history of being seen in lymphedema clinic in 2018, stopped going approximately 6 months ago stating "it didn't do any good". He does not have lymphedema pumps, he does not have custom fit  compression wrap/stockings. He is diabetic and his recent A1c last month was 7.6. He admits to chronic bilateral lower extremity pain, no change in pain since blister and ulceration development. He is currently being treated with Levaquin for bronchitis. He has home health and we will continue. 10/25/17-He is here in follow-up evaluation for bilateral lower extremity ulcerationssubtle he remains on Levaquin for bronchitis. Right lower extremity with no evidence of drainage or ulceration, persistent left lower extremity ulceration. He states that home health has not been out since his appointment. He went to [facility] on Tuesday, studies revealed: RIGHT ABI 0.9, TBI 0.6 LEFT ABI 1.1, TBI 0.6 with triphasic flow bilaterally. We will continue his same treatment plan. He has been educated on compression therapy and need for elevation. He will benefit from lymphedema pumps 11/01/17-He is here in follow-up evaluation for left lower extremity ulcer. The right lower extremity remains healed. He has home health services, but they have not been out to see the patient for 2-3 weeks. He states it home health physical therapy changed his dressing yesterday after therapy; he placed Ace wrap compression. We are still waiting for lymphedema pumps, reordered d/t need for company change. 11/08/17-He is here in follow-up evaluation for left lower extremity ulcer. It is improved. Edema is significantly improved with compression therapy. We will continue with same treatment plan and he will follow-up next week. No word regarding lymphedema pumps 11/15/17-He is here in follow-up evaluation for left lower extremity ulcer. He is healed and will be discharged from wound care services. I have reached out to [facility] regarding his lymphedema pumps. They have been unable to reach the patient; the contact number they had with the patient's wife's cell phone and she has not answered any unrecognized  calls. Contact should be made today, trial planned for next week; [facility] will continue to  follow 11/27/17 on evaluation today patient has multiple blistered areas over the right lower extremity his left lower extremity appears to be doing okay. These blistered areas show signs of no infection which is great news. With that being said he did have some necrotic skin overlying which was mechanically debrided away with saline and gauze today without complication. Overall post debridement the wounds appear to be doing better but in general his swelling seems to be increased. This is obviously not good news. I think this is what has given rise to the blisters. 12/04/17 on evaluation today patient presents for follow-up concerning his bilateral lower extremity edema in the right lower extremity ulcers. He has been tolerating the dressing changes without complication. With that being said he has had no real issues with the wraps which is also good news. Overall I'm pleased with the progress he's been making. 12/11/17 on evaluation today patient appears to be doing rather well in regard to his right lateral lower extremity ulcer. He's been tolerating the dressing changes without complication. Fortunately there does not appear to be any evidence of infection at this time. Overall I'm pleased with the progress that is being made. Unfortunately he has been in the hospital due to having what sounds to be a stomach virus/flu fortunately that is starting to get better. 12/18/17 on evaluation today patient actually appears to be doing very well in regard to his bilateral lower extremities the swelling is under fairly good control his lymphedema pumps are still not up and running quite yet. With that being said he does have several areas of opening noted as far as wounds are concerned mainly over the left lower extremity. With that being said I do believe once he gets lymphedema pumps this would least help you  mention some the fluid and preventing this from occurring. Hopefully that will be set up soon sleeves are Artie in place at his home he just waiting for the machine. 12/25/17 on evaluation today patient actually appears to be doing excellent in fact all of his ulcers appear to have resolved his Adam Merritt, Adam Merritt. ([id number]) legs appear very well. I do think he needs compression stockings we have discussed this and they are actually going to go to [place] today to elastic therapy to get this fitted for him. I think that is definitely a good thing to do. Readmission: 04/09/18 upon evaluation today this patient is seen for readmission due to bilateral lower extremity lymphedema. He has significant swelling of his extremities especially on the left although the right is also swollen he has weeping from both sides. There are no obvious open wounds at this point. Fortunately he has been doing fairly well for quite a bit of time since I last saw him. Nonetheless unfortunately this seems to have reopened and is giving quite a bit of trouble. He states this began about a week ago when he first called [place] to get in to be seen. No fevers, chills, nausea, or vomiting noted at this time. He has not been using his lymphedema pumps due to the fact that they won't fit on his leg at this point likewise is also not been using his compression for essentially the same reason. 04/16/18 upon evaluation today patient actually appears to be doing a little better in regard to the fluid in his bilateral lower extremities. With that being said he's had three falls since I saw him last week. He also states that he's been feeling very poorly. I was  concerned last week and feel like that the concern is still there as far as the congestion in his chest is concerned he seems to be breathing about the same as last week but again he states he's very weak he's not even able to walk further than from the chair to the door. His wife had  to buy a wheelchair just to be able to get them out of the house to get to the appointment today. This has me very concerned. 04/23/18 on evaluation today patient actually appears to be doing much better than last week's evaluation. At that time actually had to transport him to the ER via EMS and he subsequently was admitted for acute pulmonary edema, acute renal failure, and acute congestive heart failure. Fortunately he is doing much better. Apparently they did dialyze him and were able to take off roughly 35 pounds of fluid. Nonetheless he is feeling much better both in regard to his breathing and he's able to get around much better at this time compared to previous. Overall I'm very happy with how things are at this time. There does not appear to be any evidence of infection currently. No fevers, chills, nausea, or vomiting noted at this time. 04/30/2018 patient seen today for follow-up and management of bilateral lower extremity lymphedema. He did express being more sad today than usual due to the recent loss of his dog. He states that he has been compliant with using the lymphedema pumps. However he does admit a minute over the last 2-3 days he has not been using the pumps due to the recent loss of his dog. At this time there is no drainage or open wounds to his lower extremities. The left leg edema is measuring smaller today. Still has a significant amount of edema on bilateral lower extremities With dry flaky skin. He will be referred to the lymphedema clinic for further management. Will continue 3 layer compression wraps and follow-up in 1-2 weeks.Denies any pain, fever, chairs recently. No recent falls or injuries reported during this visit. 05/07/18 on evaluation today patient actually appears to be doing very well in regard to his lower extremities in general all things considered. With that being said he is having some pain in the legs just due to the amount of swelling. He does have an  area where he had a blister on the left lateral lower extremity this is open at this point other than that there's nothing else weeping at this time. 05/14/18 on evaluation today patient actually appears to be doing excellent all things considered in regard to his lower extremities. He still has a couple areas of weeping on each leg which has continued to be the issue for him. He does have an appointment with the lymphedema clinic although this isn't until February 2020. That was the earliest they had. In the meantime he has continued to tolerate the compression wraps without complication. 05/28/18 on evaluation today patient actually appears to be doing more poorly in regard to his left lower extremity where he has a wound open at this point. He also had a fall where he subsequently injured his right great toe which has led to an open wound at the site unfortunately. He has been tolerating the dressing changes without complication in general as far as the wraps are concerned that he has not been putting any dressing on the left 1st toe ulcer site. 06/11/18 on evaluation today patient appears to be doing much worse in regard to his  bilateral lower extremity ulcers. He has been tolerating the dressing changes without complication although his legs have not been wrapped more recently. Overall I am not very pleased with the way his legs appear. I do believe he needs to be back in compression wraps he still has not received his compression wraps from the [facility] hospital as of yet. 06/18/18 on evaluation today patient actually appears to be doing significantly better than last time I saw him. He has been tolerating the compression wraps without complication in the circumferential ulcers especially appear to be doing much better. His toe ulcer on the right in regard to the great toe is better although not as good as the legs in my pinion. No fevers chills noted Adam Merritt, Adam Merritt ([id number]) 07/02/18 on evaluation  today patient appears to be doing much better in regard to his lower extremity ulcers. Unfortunately since I last saw him he's had the distal portion of his right great toe if he dated it sounds as if this actually went downhill very quickly. I had only seen him a few days prior and the toe did not appear to be infected at that point subsequently became infected very rapidly and it was decided by the surgeon that the distal portion of the toe needed to be removed. The patient seems to be doing well in this regard he tells me. With that being said his lower extremities are doing better from the standpoint of the wounds although he is significantly swollen at this point. 07/09/18 on evaluation today patient appears to be doing better in regard to the wounds on his lower extremities. In fact everything is almost completely healed he is just a small area on the left posterior lower extremity that is open at this point. 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 With that being said he needs some kind of compression it does sound like he may not be wearing his compression, that is the wraps, during the entire time between when he's here visit to visit. Apparently his wife took the current one off because it began to "fall apart". 07/16/18 on evaluation today patient appears to be extremely swollen especially in regard to his left lower extremity unfortunately. He also has a new skin tear over the left lower extremity and there's a smaller area on the right lower extremity as well. Unfortunately this seems to be due in part to blistering and fluid buildup in his leg. He did get the reduction wraps that were ordered by the [facility] hospital for him to go to lymphedema clinic. With that being said his wounds on the legs have not healed  to the point to where they would likely accept them as a patient lymphedema clinic currently. We need to try to get this to heal. With that being said he's been taking his wraps off which is not doing him any favors at this point. In fact this is probably quite counterproductive compared to what needs to occur. We will likely need to increase to a four layer compression wrap and continue to also utilize elevation and he has to keep the wraps on not take them off as he's been doing currently hasn't had a wrap on since Saturday. 07/23/18 on evaluation today patient appears to be doing much better in regard to his bilateral lower extremities.  In fact his left lower extremity which was the largest is actually 15 cm smaller today compared to what it was last time he was here in our clinic. This is obviously good news after just one week. Nonetheless the differences he actually kept the wraps on during the entire week this time. That's not typical for him. I do believe he understands a little bit better now the severity of the situation and why it's important for him to keep these wraps on. 07/30/18 on evaluation today patient actually appears to be doing rather well in regard to his lower extremities. His legs are much smaller than they have been in the past and he actually has only one very small rudder superficial region remaining that is not closed on the left lateral/posterior lower extremity even this is almost completely close. I do believe likely next week he will be healed without any complications. I do think we need to continue the wraps however this seems to be beneficial for him. I also think it may be a good time for [place] to go ahead and see about getting the appointment with the lymphedema clinic which is supposed to be made for him in order to keep this moving along and hopefully get them into compression wraps that will in the end help him to remain healed. 08/06/18 on evaluation today patient  actually appears to be doing very well in regard as bilateral lower extremities. In fact his wounds appear to be completely healed at this time. 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 He is in the process of getting appointment with the lymphedema clinic we have made this referral were just waiting to hear back on the schedule time. We need to follow up on that today as well. 08/13/18 on evaluation today patient actually appears to be doing very well in regard to his bilateral lower extremities there are no open wounds at this point. We are gonna go ahead and see about ordering the Velcro compression wraps for him had a discussion with them about [place] doing it versus the [place] they feel like they can definitely afford going ahead and get the wraps themselves and they would prefer to try to avoid having to go to the lymphedema clinic if it all possible which I completely understand. As long as he has good compression I'm okay either way. 3/17//20 on evaluation today patient actually appears to be doing well in regard to his bilateral lower extremity ulcers. He has been tolerating the dressing changes without complication specifically the compression wraps. Overall is had no issues my fingers finding that I see at this point is that he is having trouble with constipation. He tells me he has not been able to go to the bathroom for about six days. He's taken to over-the-counter oral laxatives unfortunately this is not helping. He has not contacted his doctor. 08/27/18 on evaluation today patient appears to be doing fairly well in regard to his lower extremities at this point. There does not appear to be any new altars is swelling is very well controlled. We are still waiting for his Velcro compression wraps to arrive that should be sometime in the next week is Artie sent the check for these. 09/03/18 on evaluation today patient appears to be doing  excellent in regard to his bilateral lower extremities which he shows Adam Merritt, Adam Merritt. ([id number]) no signs of wound openings in regard to this point. He does have  his Velcro compression wraps which did arrive in the mail since I saw him last week. Overall he is doing excellent in my pinion. 09/13/18 on evaluation today patient appears to be doing very well currently in regard to the overall appearance of his bilateral lower extremities although he's a little bit more swollen than last time we saw him. At that point he been discharged without any open wounds. Nonetheless he has a small open wound on the posterior left lower extremity with some evidence of cellulitis noted as well. Fortunately I feel like he has made good progress overall with regard to his lower extremities from were things used to be. 09/20/18 on evaluation today patient actually appears to be doing much better. The erythematous lower extremity is improving wound itself which is still open appears to be doing much better as far as but appearance as well as pain is concerned overall very pleased in this regard. There's no signs of active infection at this time. 09/27/18 on evaluation today patient's wounds on the lower extremity actually appear to be doing fairly well at this time which is good news. There is no evidence of active infection currently and again is just as left lower extremity were there any wounds at this point anyway. I believe they may be completely healed but again I'm not 100% sure based on evaluation today. I think one more week of observation would likely be a good idea. Electronic Signature(s) Signed: 09/27/2018 4:48:18 PM By: Worthy Keeler PA-C Entered By: Worthy Keeler on 09/27/2018 14:40:50 Adam Merritt ([id number]) -------------------------------------------------------------------------------- Physical Exam Details Patient Name: Adam Merritt Date of Service: 09/27/2018 1:30 PM Medical Record  Number: [id number] Patient Account Number: [id number] Date of Birth/Sex: 05/21/1946 (73 y.o. M) Treating RN: Montey Hora Primary Care Provider: [name] Other Clinician: Referring Provider: [name] Treating Provider/Extender: STONE III, Joangel Vanosdol [name]: 74 Constitutional Well-nourished and well-hydrated in no acute distress. Respiratory normal breathing without difficulty. clear to auscultation bilaterally. Cardiovascular regular rate and rhythm with normal S1, S2. Psychiatric this patient is able to make decisions and demonstrates good insight into disease process. Alert and Oriented x 3. pleasant and cooperative. Notes Patient's wound bed currently showed signs of good granulation at this time the does not appear to be evidence of active infection which is also good news. Overall I feel like that the wound could be healed but again I think we may want to get one more week of observation before completely calling this healed and discharging him. I want to avoid a potential readmission. Electronic Signature(s) Signed: 09/27/2018 4:48:18 PM By: Worthy Keeler PA-C Entered By: Worthy Keeler on 09/27/2018 14:41:19 Adam Merritt ([id number]) -------------------------------------------------------------------------------- Physician Orders Details Patient Name: Adam Merritt Date of Service: 09/27/2018 1:30 PM Medical Record Number: [id number] Patient Account Number: [id number] Date of Birth/Sex: July 16, 1945 (73 y.o. M) Treating RN: Montey Hora Primary Care Provider: [name] Other Clinician: Referring Provider: [name] Treating Provider/Extender: Melburn Hake, Blair Lundeen [name]: 55 Verbal / Phone Orders: No Diagnosis Coding ICD-10 Coding Code Description I89.0 Lymphedema, not elsewhere classified L97.512 Non-pressure chronic ulcer of other part of right foot with fat layer exposed L97.822 Non-pressure chronic ulcer of other part of left lower leg with  fat layer exposed L98.492 Non-pressure chronic ulcer of skin of other sites with fat layer exposed E66.9 Obesity, unspecified Wound Cleansing Wound #19 Left,Distal,Posterior Lower Leg o Clean wound with Normal Saline. o May Shower, gently pat wound dry  prior to applying new dressing. Primary Wound Dressing Wound #19 Left,Distal,Posterior Lower Leg o Silver Alginate Secondary Dressing Wound #19 Left,Distal,Posterior Lower Leg o Boardered Foam Dressing Dressing Change Frequency Wound #19 Left,Distal,Posterior Lower Leg o Change dressing every other day. Follow-up Appointments Wound #19 Left,Distal,Posterior Lower Leg o Return Appointment in 1 week. Edema Control Wound #19 Left,Distal,Posterior Lower Leg o Patient to wear own Velcro compression garment. o Elevate legs to the level of the heart and pump ankles as often as possible o Compression Pump: Use compression pump on left lower extremity for 30 minutes, twice daily. - 1 hour, not 30 minutes o Compression Pump: Use compression pump on right lower extremity for 30 minutes, twice daily. - 1 hour, not 30 minutes Electronic Signature(s) Adam Merritt, Adam Merritt ([id number]) Signed: 09/27/2018 4:48:18 PM By: Worthy Keeler PA-C Signed: 09/27/2018 4:49:53 PM By: Montey Hora Entered By: Montey Hora on 09/27/2018 14:36:53 Adam Merritt, Adam Merritt ([id number]) -------------------------------------------------------------------------------- Problem List Details Patient Name: Adam Merritt Date of Service: 09/27/2018 1:30 PM Medical Record Number: [id number] Patient Account Number: [id number] Date of Birth/Sex: Oct 06, 1945 (73 y.o. M) Treating RN: Montey Hora Primary Care Provider: [name] Other Clinician: Referring Provider: [name] Treating Provider/Extender: Melburn Hake, Clella Mckeel [name]: 24 Active Problems ICD-10 Evaluated Encounter Code Description Active Date Today Diagnosis I89.0 Lymphedema, not  elsewhere classified 04/09/2018 No Yes L97.512 Non-pressure chronic ulcer of other part of right foot with fat 05/28/2018 No Yes layer exposed L97.822 Non-pressure chronic ulcer of other part of left lower leg with 05/28/2018 No Yes fat layer exposed L98.492 Non-pressure chronic ulcer of skin of other sites with fat layer 06/11/2018 No Yes exposed E66.9 Obesity, unspecified 04/09/2018 No Yes Inactive Problems Resolved Problems ICD-10 Code Description Active Date Resolved Date L03.115 Cellulitis of right lower limb 04/09/2018 04/09/2018 L03.116 Cellulitis of left lower limb 04/09/2018 04/09/2018 Electronic Signature(s) Signed: 09/27/2018 4:48:18 PM By: Worthy Keeler PA-C Entered By: Worthy Keeler on 09/27/2018 14:27:11 Adam Merritt ([id number]) Adam Merritt, Adam Merritt ([id number]) -------------------------------------------------------------------------------- Progress Note Details Patient Name: Adam Merritt Date of Service: 09/27/2018 1:30 PM Medical Record Number: [id number] Patient Account Number: [id number] Date of Birth/Sex: [birth year] (73 y.o. M) Treating RN: Montey Hora Primary Care Provider: [name] Other Clinician: Referring Provider: [name] Treating Provider/Extender: Melburn Hake, Oveta Idris [name]: 24 Subjective Chief Complaint Information obtained from Patient he is here for evaluation of bilateral lower extremity lymphedema right right toe and left LE ulcers History of Present Illness (HPI) 10/18/17-He is here for initial evaluation of bilateral lower extremity ulcerations in the presence of venous insufficiency and lymphedema. He has been seen by vascular medicine in the past, Dr. Lucky Cowboy, last seen in 2016. He does have a history of abnormal ABIs, which is to be expected given his lymphedema and venous insufficiency. According to Epic, it appears that all attempts for arterial evaluation and/or angiography were not follow through with by patient. He does have a  history of being seen in lymphedema clinic in 2018, stopped going approximately 6 months ago stating "it didn't do any good". He does not have lymphedema pumps, he does not have custom fit compression wrap/stockings. He is diabetic and his recent A1c last month was 7.6. He admits to chronic bilateral lower extremity pain, no change in pain since blister and ulceration development. He is currently being treated with Levaquin for bronchitis. He has home health and we will continue. 10/25/17-He is here in follow-up evaluation for bilateral lower extremity ulcerationssubtle he remains  on Levaquin for bronchitis. Right lower extremity with no evidence of drainage or ulceration, persistent left lower extremity ulceration. He states that home health has not been out since his appointment. He went to [facility] on Tuesday, studies revealed: RIGHT ABI 0.9, TBI 0.6 LEFT ABI 1.1, TBI 0.6 with triphasic flow bilaterally. We will continue his same treatment plan. He has been educated on compression therapy and need for elevation. He will benefit from lymphedema pumps 11/01/17-He is here in follow-up evaluation for left lower extremity ulcer. The right lower extremity remains healed. He has home health services, but they have not been out to see the patient for 2-3 weeks. He states it home health physical therapy changed his dressing yesterday after therapy; he placed Ace wrap compression. We are still waiting for lymphedema pumps, reordered d/t need for company change. 11/08/17-He is here in follow-up evaluation for left lower extremity ulcer. It is improved. Edema is significantly improved with compression therapy. We will continue with same treatment plan and he will follow-up next week. No word regarding lymphedema pumps 11/15/17-He is here in follow-up evaluation for left lower extremity ulcer. He is healed and will be discharged from wound care services. I have reached out to [facility]  regarding his lymphedema pumps. They have been unable to reach the patient; the contact number they had with the patient's wife's cell phone and she has not answered any unrecognized calls. Contact should be made today, trial planned for next week; [facility] will continue to follow 11/27/17 on evaluation today patient has multiple blistered areas over the right lower extremity his left lower extremity appears to be doing okay. These blistered areas show signs of no infection which is great news. With that being said he did have some necrotic skin overlying which was mechanically debrided away with saline and gauze today without complication. Overall post debridement the wounds appear to be doing better but in general his swelling seems to be increased. This is obviously not good news. I think this is what has given rise to the blisters. 12/04/17 on evaluation today patient presents for follow-up concerning his bilateral lower extremity edema in the right lower extremity ulcers. He has been tolerating the dressing changes without complication. With that being said he has had no real issues with the wraps which is also good news. Overall I'm pleased with the progress he's been making. 12/11/17 on evaluation today patient appears to be doing rather well in regard to his right lateral lower extremity ulcer. He's been tolerating the dressing changes without complication. Fortunately there does not appear to be any evidence of infection at this time. Overall I'm pleased with the progress that is being made. Unfortunately he has been in the hospital due to having what sounds to be a stomach virus/flu fortunately that is starting to get better. 12/18/17 on evaluation today patient actually appears to be doing very well in regard to his bilateral lower extremities the Adam Merritt, Adam Merritt. ([id number]) swelling is under fairly good control his lymphedema pumps are still not up and running quite yet. With that  being said he does have several areas of opening noted as far as wounds are concerned mainly over the left lower extremity. With that being said I do believe once he gets lymphedema pumps this would least help you mention some the fluid and preventing this from occurring. Hopefully that will be set up soon sleeves are Artie in place at his home he just waiting for the  machine. 12/25/17 on evaluation today patient actually appears to be doing excellent in fact all of his ulcers appear to have resolved his legs appear very well. I do think he needs compression stockings we have discussed this and they are actually going to go to [place] today to elastic therapy to get this fitted for him. I think that is definitely a good thing to do. Readmission: 04/09/18 upon evaluation today this patient is seen for readmission due to bilateral lower extremity lymphedema. He has significant swelling of his extremities especially on the left although the right is also swollen he has weeping from both sides. There are no obvious open wounds at this point. Fortunately he has been doing fairly well for quite a bit of time since I last saw him. Nonetheless unfortunately this seems to have reopened and is giving quite a bit of trouble. He states this began about a week ago when he first called [place] to get in to be seen. No fevers, chills, nausea, or vomiting noted at this time. He has not been using his lymphedema pumps due to the fact that they won't fit on his leg at this point likewise is also not been using his compression for essentially the same reason. 04/16/18 upon evaluation today patient actually appears to be doing a little better in regard to the fluid in his bilateral lower extremities. With that being said he's had three falls since I saw him last week. He also states that he's been feeling very poorly. I was concerned last week and feel like that the concern is still there as far as the congestion in his  chest is concerned he seems to be breathing about the same as last week but again he states he's very weak he's not even able to walk further than from the chair to the door. His wife had to buy a wheelchair just to be able to get them out of the house to get to the appointment today. This has me very concerned. 04/23/18 on evaluation today patient actually appears to be doing much better than last week's evaluation. At that time actually had to transport him to the ER via EMS and he subsequently was admitted for acute pulmonary edema, acute renal failure, and acute congestive heart failure. Fortunately he is doing much better. Apparently they did dialyze him and were able to take off roughly 35 pounds of fluid. Nonetheless he is feeling much better both in regard to his breathing and he's able to get around much better at this time compared to previous. Overall I'm very happy with how things are at this time. There does not appear to be any evidence of infection currently. No fevers, chills, nausea, or vomiting noted at this time. 04/30/2018 patient seen today for follow-up and management of bilateral lower extremity lymphedema. He did express being more sad today than usual due to the recent loss of his dog. He states that he has been compliant with using the lymphedema pumps. However he does admit a minute over the last 2-3 days he has not been using the pumps due to the recent loss of his dog. At this time there is no drainage or open wounds to his lower extremities. The left leg edema is measuring smaller today. Still has a significant amount of edema on bilateral lower extremities With dry flaky skin. He will be referred to the lymphedema clinic for further management. Will continue 3 layer compression wraps and follow-up in 1-2 weeks.Denies any pain,  fever, chairs recently. No recent falls or injuries reported during this visit. 05/07/18 on evaluation today patient actually appears to be doing  very well in regard to his lower extremities in general all things considered. With that being said he is having some pain in the legs just due to the amount of swelling. He does have an area where he had a blister on the left lateral lower extremity this is open at this point other than that there's nothing else weeping at this time. 05/14/18 on evaluation today patient actually appears to be doing excellent all things considered in regard to his lower extremities. He still has a couple areas of weeping on each leg which has continued to be the issue for him. He does have an appointment with the lymphedema clinic although this isn't until February 2020. That was the earliest they had. In the meantime he has continued to tolerate the compression wraps without complication. 05/28/18 on evaluation today patient actually appears to be doing more poorly in regard to his left lower extremity where he has a wound open at this point. He also had a fall where he subsequently injured his right great toe which has led to an open wound at the site unfortunately. He has been tolerating the dressing changes without complication in general as far as the wraps are concerned that he has not been putting any dressing on the left 1st toe ulcer site. 06/11/18 on evaluation today patient appears to be doing much worse in regard to his bilateral lower extremity ulcers. He has been tolerating the dressing changes without complication although his legs have not been wrapped more recently. Overall I am not very pleased with the way his legs appear. I do believe he needs to be back in compression wraps he still has not received his compression wraps from the [facility] hospital as of yet. Adam Merritt, Adam Merritt ([id number]) 06/18/18 on evaluation today patient actually appears to be doing significantly better than last time I saw him. He has been tolerating the compression wraps without complication in the circumferential ulcers especially  appear to be doing much better. His toe ulcer on the right in regard to the great toe is better although not as good as the legs in my pinion. No fevers chills noted 07/02/18 on evaluation today patient appears to be doing much better in regard to his lower extremity ulcers. Unfortunately since I last saw him he's had the distal portion of his right great toe if he dated it sounds as if this actually went downhill very quickly. I had only seen him a few days prior and the toe did not appear to be infected at that point subsequently became infected very rapidly and it was decided by the surgeon that the distal portion of the toe needed to be removed. The patient seems to be doing well in this regard he tells me. With that being said his lower extremities are doing better from the standpoint of the wounds although he is significantly swollen at this point. 07/09/18 on evaluation today patient appears to be doing better in regard to the wounds on his lower extremities. In fact everything is almost completely healed he is just a small area on the left posterior lower extremity that is open at this point. 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 With that being said he needs some kind of compression it does sound like he may not be wearing his compression, that is the wraps, during the entire time between when he's here visit to visit. Apparently his wife took the current one off because it began to "fall apart". 07/16/18 on evaluation today patient appears to be extremely swollen especially in regard to his left lower extremity unfortunately. He also has a new skin tear over the left lower extremity and there's a smaller area on the right lower extremity as well. Unfortunately this seems to be due in part to blistering and  fluid buildup in his leg. He did get the reduction wraps that were ordered by the [facility] hospital for him to go to lymphedema clinic. With that being said his wounds on the legs have not healed to the point to where they would likely accept them as a patient lymphedema clinic currently. We need to try to get this to heal. With that being said he's been taking his wraps off which is not doing him any favors at this point. In fact this is probably quite counterproductive compared to what needs to occur. We will likely need to increase to a four layer compression wrap and continue to also utilize elevation and he has to keep the wraps on not take them off as he's been doing currently hasn't had a wrap on since Saturday. 07/23/18 on evaluation today patient appears to be doing much better in regard to his bilateral lower extremities. In fact his left lower extremity which was the largest is actually 15 cm smaller today compared to what it was last time he was here in our clinic. This is obviously good news after just one week. Nonetheless the differences he actually kept the wraps on during the entire week this time. That's not typical for him. I do believe he understands a little bit better now the severity of the situation and why it's important for him to keep these wraps on. 07/30/18 on evaluation today patient actually appears to be doing rather well in regard to his lower extremities. His legs are much smaller than they have been in the past and he actually has only one very small rudder superficial region remaining that is not closed on the left lateral/posterior lower extremity even this is almost completely close. I do believe likely next week he will be healed without any complications. I do think we need to continue the wraps however this seems to be beneficial for him. I also think it may be a good time for [place] to go ahead and see about getting the appointment with the lymphedema clinic which is  supposed to be made for him in order to keep this moving along and hopefully get them into compression wraps that will in the end help him to remain healed. 08/06/18 on evaluation today patient actually appears to be doing very well in regard as bilateral lower extremities. In fact his wounds appear to be completely healed at this time. 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 He is in the process of getting appointment with the lymphedema clinic we have made this referral were just waiting to hear back on the schedule time. We need to follow up on that today as well. 08/13/18 on evaluation today patient actually appears to be doing very well in regard to his bilateral lower extremities there are no open wounds at this point. We  are gonna go ahead and see about ordering the Velcro compression wraps for him had a discussion with them about [place] doing it versus the [place] they feel like they can definitely afford going ahead and get the wraps themselves and they would prefer to try to avoid having to go to the lymphedema clinic if it all possible which I completely understand. As long as he has good compression I'm okay either way. 3/17//20 on evaluation today patient actually appears to be doing well in regard to his bilateral lower extremity ulcers. He has been tolerating the dressing changes without complication specifically the compression wraps. Overall is had no issues my fingers finding that I see at this point is that he is having trouble with constipation. He tells me he has not been able to go to the bathroom for about six days. He's taken to over-the-counter oral laxatives unfortunately this is not helping. He has not contacted his doctor. GILLERMO, POCH ([id number]) 08/27/18 on evaluation today patient appears to be doing fairly well in regard to his lower extremities at this point. There does not appear to be any new altars is swelling  is very well controlled. We are still waiting for his Velcro compression wraps to arrive that should be sometime in the next week is Artie sent the check for these. 09/03/18 on evaluation today patient appears to be doing excellent in regard to his bilateral lower extremities which he shows no signs of wound openings in regard to this point. He does have his Velcro compression wraps which did arrive in the mail since I saw him last week. Overall he is doing excellent in my pinion. 09/13/18 on evaluation today patient appears to be doing very well currently in regard to the overall appearance of his bilateral lower extremities although he's a little bit more swollen than last time we saw him. At that point he been discharged without any open wounds. Nonetheless he has a small open wound on the posterior left lower extremity with some evidence of cellulitis noted as well. Fortunately I feel like he has made good progress overall with regard to his lower extremities from were things used to be. 09/20/18 on evaluation today patient actually appears to be doing much better. The erythematous lower extremity is improving wound itself which is still open appears to be doing much better as far as but appearance as well as pain is concerned overall very pleased in this regard. There's no signs of active infection at this time. 09/27/18 on evaluation today patient's wounds on the lower extremity actually appear to be doing fairly well at this time which is good news. There is no evidence of active infection currently and again is just as left lower extremity were there any wounds at this point anyway. I believe they may be completely healed but again I'm not 100% sure based on evaluation today. I think one more week of observation would likely be a good idea. Patient History Information obtained from Patient. Family History Cancer - Paternal Grandparents, Kidney Disease - Siblings, Lung Disease - Mother, No  family history of Diabetes, Heart Disease, Hereditary Spherocytosis, Hypertension, Seizures, Stroke, Thyroid Problems, Tuberculosis. Social History Never smoker, Marital Status - Married, Alcohol Use - Never, Drug Use - No History, Caffeine Use - Never. Medical History Eyes Denies history of Cataracts, Glaucoma, Optic Neuritis Ear/Nose/Mouth/Throat Denies history of Chronic sinus problems/congestion, Middle ear problems Hematologic/Lymphatic Patient has history of Anemia - aplastic anemia, Lymphedema Denies history of Hemophilia, Human  Immunodeficiency Virus, Sickle Cell Disease Respiratory Patient has history of Chronic Obstructive Pulmonary Disease (COPD), Sleep Apnea Denies history of Aspiration, Asthma, Pneumothorax, Tuberculosis Cardiovascular Patient has history of Congestive Heart Failure, Coronary Artery Disease, Hypertension, Peripheral Venous Disease Denies history of Angina, Arrhythmia, Deep Vein Thrombosis, Hypotension, Myocardial Infarction, Peripheral Arterial Disease, Phlebitis, Vasculitis Gastrointestinal Denies history of Cirrhosis , Colitis, Crohn s, Hepatitis A, Hepatitis B, Hepatitis C Endocrine Patient has history of Type II Diabetes Denies history of Type I Diabetes Genitourinary Denies history of End Stage Renal Disease Immunological Denies history of Lupus Erythematosus, Raynaud s, Scleroderma Adam Merritt, Adam Merritt ([id number]) Integumentary (Skin) Denies history of History of Burn, History of pressure wounds Musculoskeletal Patient has history of Osteoarthritis Denies history of Gout, Rheumatoid Arthritis, Osteomyelitis Neurologic Patient has history of Neuropathy Denies history of Dementia, Quadriplegia, Paraplegia, Seizure Disorder Oncologic Patient has history of Received Chemotherapy - last dose 2006 Denies history of Received Radiation Psychiatric Denies history of Anorexia/bulimia, Confinement Anxiety Hospitalization/Surgery History -  bronchitis. Medical And Surgical History Notes Respiratory home O2 at night as needed Cardiovascular varicose veins Neurologic CVA - 1992 Review of Systems (ROS) Constitutional Symptoms (General Health) Denies complaints or symptoms of Fatigue, Fever, Chills, Marked Weight Change. Respiratory Denies complaints or symptoms of Chronic or frequent coughs, Shortness of Breath. Cardiovascular Complains or has symptoms of LE edema. Psychiatric Denies complaints or symptoms of Anxiety, Claustrophobia. Objective Constitutional Well-nourished and well-hydrated in no acute distress. Vitals Time Taken: 2:06 PM, Height: 66 in, Weight: 323 lbs, BMI: 52.1, Temperature: 98.3 F, Pulse: 77 bpm, Respiratory Rate: 20 breaths/min, Blood Pressure: 164/72 mmHg. Respiratory normal breathing without difficulty. clear to auscultation bilaterally. Cardiovascular regular rate and rhythm with normal S1, S2. Psychiatric this patient is able to make decisions and demonstrates good insight into disease process. Alert and Oriented x 3. pleasant JERMAIN, Adam Merritt ([id number]) and cooperative. General Notes: Patient's wound bed currently showed signs of good granulation at this time the does not appear to be evidence of active infection which is also good news. Overall I feel like that the wound could be healed but again I think we may want to get one more week of observation before completely calling this healed and discharging him. I want to avoid a potential readmission. Integumentary (Hair, Skin) Wound #19 status is Open. Original cause of wound was Gradually Appeared. The wound is located on the Left,Distal,Posterior Lower Leg. The wound measures 0.1cm length x 0.1cm width x 0.1cm depth; 0.008cm^2 area and 0.001cm^3 volume. The wound is limited to skin breakdown. There is no tunneling or undermining noted. There is a medium amount of serous drainage noted. The wound margin is flat and intact. There is large  (67-100%) pink granulation within the wound bed. There is no necrotic tissue within the wound bed. The periwound skin appearance exhibited: Hemosiderin Staining. The periwound skin appearance did not exhibit: Callus, Crepitus, Excoriation, Induration, Rash, Scarring, Dry/Scaly, Maceration, Atrophie Blanche, Cyanosis, Ecchymosis, Mottled, Pallor, Rubor, Erythema. Periwound temperature was noted as No Abnormality. 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 May Shower, gently pat wound dry prior to applying new dressing. Primary Wound Dressing: Wound #19 Left,Distal,Posterior Lower Leg: Silver Alginate Secondary Dressing:  Wound #19 Left,Distal,Posterior Lower Leg: Boardered Foam Dressing Dressing Change Frequency: Wound #19 Left,Distal,Posterior Lower Leg: Change dressing every other day. Follow-up Appointments: Wound #19 Left,Distal,Posterior Lower Leg: Return Appointment in 1 week. Edema Control: Wound #19 Left,Distal,Posterior Lower Leg: Patient to wear own Velcro compression garment. Adam Merritt, LIWANAG ([id number]) Elevate legs to the level of the heart and pump ankles as often as possible Compression Pump: Use compression pump on left lower extremity for 30 minutes, twice daily. - 1 hour, not 30 minutes Compression Pump: Use compression pump on right lower extremity for 30 minutes, twice daily. - 1 hour, not 30 minutes My suggestion is that we continue with the above wound care measures for one more week. We will subsequently see were things stand at follow-up. If anything changes worsens meantime patient  will contact the office and let me know. Please see above for specific wound care orders. We will see patient for re-evaluation in 1 week(s) here in the clinic. If anything worsens or changes patient will contact our office for additional recommendations. Electronic Signature(s) Signed: 09/27/2018 4:48:18 PM By: Worthy Keeler PA-C Entered By: Worthy Keeler on 09/27/2018 14:41:43 Adam Merritt ([id number]) -------------------------------------------------------------------------------- ROS/PFSH Details Patient Name: Adam Merritt Date of Service: 09/27/2018 1:30 PM Medical Record Number: [id number] Patient Account Number: [id number] Date of Birth/Sex: Jun 02, 1946 (73 y.o. M) Treating RN: Montey Hora Primary Care Provider: [name] Other Clinician: Referring Provider: [name] Treating Provider/Extender: Melburn Hake, Koltan Portocarrero [name]: 24 Information Obtained From Patient Constitutional Symptoms (General Health) Complaints and Symptoms: Negative for: Fatigue; Fever; Chills; Marked Weight Change Respiratory Complaints and Symptoms: Negative for: Chronic or frequent coughs; Shortness of Breath Medical History: Positive for: Chronic Obstructive Pulmonary Disease (COPD); Sleep Apnea Negative for: Aspiration; Asthma; Pneumothorax; Tuberculosis Past Medical History Notes: home O2 at night as needed Cardiovascular Complaints and Symptoms: Positive for: LE edema Medical History: Positive for: Congestive Heart Failure; Coronary Artery Disease; Hypertension; Peripheral Venous Disease Negative for: Angina; Arrhythmia; Deep Vein Thrombosis; Hypotension; Myocardial Infarction; Peripheral Arterial Disease; Phlebitis; Vasculitis Past Medical History Notes: varicose veins Psychiatric Complaints and Symptoms: Negative for: Anxiety; Claustrophobia Medical History: Negative for: Anorexia/bulimia; Confinement Anxiety Eyes Medical History: Negative for: Cataracts; Glaucoma;  Optic Neuritis Ear/Nose/Mouth/Throat Medical History: Negative for: Chronic sinus problems/congestion; Middle ear problems Hematologic/Lymphatic TRAVONE, GEORG ([id number]) Medical History: Positive for: Anemia - aplastic anemia; Lymphedema Negative for: Hemophilia; Human Immunodeficiency Virus; Sickle Cell Disease Gastrointestinal Medical History: Negative for: Cirrhosis ; Colitis; Crohnos; Hepatitis A; Hepatitis B; Hepatitis C Endocrine Medical History: Positive for: Type II Diabetes Negative for: Type I Diabetes Time with diabetes: since 2006 Treated with: Insulin Blood sugar tested every day: Yes Tested : Blood sugar testing results: Breakfast: 209 Genitourinary Medical History: Negative for: End Stage Renal Disease Immunological Medical History: Negative for: Lupus Erythematosus; Raynaudos; Scleroderma Integumentary (Skin) Medical History: Negative for: History of Burn; History of pressure wounds Musculoskeletal Medical History: Positive for: Osteoarthritis Negative for: Gout; Rheumatoid Arthritis; Osteomyelitis Neurologic Medical History: Positive for: Neuropathy Negative for: Dementia; Quadriplegia; Paraplegia; Seizure Disorder Past Medical History Notes: CVA - 1992 Oncologic Medical History: Positive for: Received Chemotherapy - last dose 2006 Negative for: Received Radiation Immunizations Pneumococcal Vaccine: Received Pneumococcal Vaccination: Yes DEWON, MENDIZABAL ([id number]) Implantable Devices No devices added Hospitalization / Surgery History Type of Hospitalization/Surgery bronchitis Family and Social History Cancer: Yes - Paternal Grandparents; Diabetes: No; Heart Disease: No; Hereditary Spherocytosis: No; Hypertension: No; Kidney Disease: Yes - Siblings; Lung Disease: Yes - Mother; Seizures: No;  Stroke: No; Thyroid Problems: No; Tuberculosis: No; Never smoker; Marital Status - Married; Alcohol Use: Never; Drug Use: No History; Caffeine Use:  Never; Financial Concerns: No; Food, Clothing or Shelter Needs: No; Support System Lacking: No; Transportation Concerns: No Physician Affirmation I have reviewed and agree with the above information. Electronic Signature(s) Signed: 09/27/2018 4:48:18 PM By: Worthy Keeler PA-C Signed: 09/27/2018 4:49:53 PM By: Montey Hora Entered By: Worthy Keeler on 09/27/2018 14:41:06 Adam Merritt ([id number]) -------------------------------------------------------------------------------- SuperBill Details Patient Name: Adam Merritt Date of Service: 09/27/2018 Medical Record Number: [id number] Patient Account Number: [id number] Date of Birth/Sex: [birth year] (73 y.o. 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 chronic ulcer of skin of other sites with fat layer exposed E66.9 Obesity, unspecified Facility Procedures CPT4 Code: [id number] Description: 99213 - WOUND CARE VISIT-LEV 3 EST PT Modifier: Quantity: 1 Physician Procedures CPT4 Code Description: [id number] - WC PHYS LEVEL 4 - EST PT ICD-10 Diagnosis Description I89.0 Lymphedema, not elsewhere classified L97.512 Non-pressure chronic ulcer of other part of right foot with fat L97.822 Non-pressure chronic ulcer of other  part of left lower leg with L98.492 Non-pressure chronic ulcer of skin of other sites with fat layer Modifier: layer exposed fat layer expos exposed Quantity: 1 ed Electronic Signature(s) Signed: 09/27/2018 4:48:18 PM By: Worthy Keeler PA-C Entered By: Worthy Keeler on 09/27/2018 14:42:25

## 2018-10-04 ENCOUNTER — Other Ambulatory Visit: Payer: Self-pay

## 2018-10-04 ENCOUNTER — Encounter: Payer: Medicare Other | Attending: Physician Assistant | Admitting: Physician Assistant

## 2018-10-04 DIAGNOSIS — L97512 Non-pressure chronic ulcer of other part of right foot with fat layer exposed: Secondary | ICD-10-CM | POA: Insufficient documentation

## 2018-10-04 DIAGNOSIS — L97822 Non-pressure chronic ulcer of other part of left lower leg with fat layer exposed: Secondary | ICD-10-CM | POA: Diagnosis not present

## 2018-10-04 DIAGNOSIS — E1140 Type 2 diabetes mellitus with diabetic neuropathy, unspecified: Secondary | ICD-10-CM | POA: Insufficient documentation

## 2018-10-04 DIAGNOSIS — I890 Lymphedema, not elsewhere classified: Secondary | ICD-10-CM | POA: Insufficient documentation

## 2018-10-04 DIAGNOSIS — I739 Peripheral vascular disease, unspecified: Secondary | ICD-10-CM | POA: Diagnosis not present

## 2018-10-04 DIAGNOSIS — D619 Aplastic anemia, unspecified: Secondary | ICD-10-CM | POA: Diagnosis not present

## 2018-10-04 DIAGNOSIS — M1990 Unspecified osteoarthritis, unspecified site: Secondary | ICD-10-CM | POA: Insufficient documentation

## 2018-10-04 DIAGNOSIS — I2510 Atherosclerotic heart disease of native coronary artery without angina pectoris: Secondary | ICD-10-CM | POA: Diagnosis not present

## 2018-10-04 DIAGNOSIS — L98492 Non-pressure chronic ulcer of skin of other sites with fat layer exposed: Secondary | ICD-10-CM | POA: Insufficient documentation

## 2018-10-04 DIAGNOSIS — E669 Obesity, unspecified: Secondary | ICD-10-CM | POA: Insufficient documentation

## 2018-10-04 DIAGNOSIS — G4730 Sleep apnea, unspecified: Secondary | ICD-10-CM | POA: Diagnosis not present

## 2018-10-04 DIAGNOSIS — E11622 Type 2 diabetes mellitus with other skin ulcer: Secondary | ICD-10-CM | POA: Diagnosis not present

## 2018-10-04 DIAGNOSIS — E11621 Type 2 diabetes mellitus with foot ulcer: Secondary | ICD-10-CM | POA: Insufficient documentation

## 2018-10-04 DIAGNOSIS — I110 Hypertensive heart disease with heart failure: Secondary | ICD-10-CM | POA: Diagnosis not present

## 2018-10-04 DIAGNOSIS — Z6841 Body Mass Index (BMI) 40.0 and over, adult: Secondary | ICD-10-CM | POA: Insufficient documentation

## 2018-10-04 DIAGNOSIS — I509 Heart failure, unspecified: Secondary | ICD-10-CM | POA: Diagnosis not present

## 2018-10-04 DIAGNOSIS — Z8673 Personal history of transient ischemic attack (TIA), and cerebral infarction without residual deficits: Secondary | ICD-10-CM | POA: Insufficient documentation

## 2018-10-04 NOTE — Progress Notes (Signed)
KOLDEN, DUPEE ([id number]) Visit Report for 10/04/2018 Chief Complaint Document Details Patient Name: Adam Merritt, Adam Merritt Date of Service: 10/04/2018 1:45 PM Medical Record Number: [id number] Patient Account Number: [id number] Date of Birth/Sex: 1945/11/25 (73 y.o. M) Treating RN: Montey Hora Primary Care Provider: [name] Other Clinician: Referring Provider: [name] Treating Provider/Extender: Melburn Hake, [name]: 25 Information Obtained from: Patient Chief Complaint he is here for evaluation of bilateral lower extremity lymphedema right right toe and left LE ulcers Electronic Signature(s) Signed: 10/04/2018 4:18:17 PM By: Worthy Keeler PA-C Entered By: Worthy Keeler on 10/04/2018 14:00:28 Adam Merritt ([id number]) -------------------------------------------------------------------------------- HPI Details Patient Name: Adam Merritt Date of Service: 10/04/2018 1:45 PM Medical Record Number: [id number] Patient Account Number: [id number] Date of Birth/Sex: 1946-04-05 (73 y.o. M) Treating RN: Montey Hora Primary Care Provider: [name] Other Clinician: Referring Provider: [name] Treating Provider/Extender: Melburn Hake, [name]: 25 History of Present Illness HPI Description: 10/18/17-He is here for initial evaluation of bilateral lower extremity ulcerations in the presence of venous insufficiency and lymphedema. He has been seen by vascular medicine in the past, Dr. Lucky Cowboy, last seen in 2016. He does have a history of abnormal ABIs, which is to be expected given his lymphedema and venous insufficiency. According to Epic, it appears that all attempts for arterial evaluation and/or angiography were not follow through with by patient. He does have a history of being seen in lymphedema clinic in 2018, stopped going approximately 6 months ago stating "it didn't do any good". He does not have lymphedema pumps, he does not have custom fit compression  wrap/stockings. He is diabetic and his recent A1c last month was 7.6. He admits to chronic bilateral lower extremity pain, no change in pain since blister and ulceration development. He is currently being treated with Levaquin for bronchitis. He has home health and we will continue. 10/25/17-He is here in follow-up evaluation for bilateral lower extremity ulcerationssubtle he remains on Levaquin for bronchitis. Right lower extremity with no evidence of drainage or ulceration, persistent left lower extremity ulceration. He states that home health has not been out since his appointment. He went to [facility] on Tuesday, studies revealed: RIGHT ABI 0.9, TBI 0.6 LEFT ABI 1.1, TBI 0.6 with triphasic flow bilaterally. We will continue his same treatment plan. He has been educated on compression therapy and need for elevation. He will benefit from lymphedema pumps 11/01/17-He is here in follow-up evaluation for left lower extremity ulcer. The right lower extremity remains healed. He has home health services, but they have not been out to see the patient for 2-3 weeks. He states it home health physical therapy changed his dressing yesterday after therapy; he placed Ace wrap compression. We are still waiting for lymphedema pumps, reordered d/t need for company change. 11/08/17-He is here in follow-up evaluation for left lower extremity ulcer. It is improved. Edema is significantly improved with compression therapy. We will continue with same treatment plan and he will follow-up next week. No word regarding lymphedema pumps 11/15/17-He is here in follow-up evaluation for left lower extremity ulcer. He is healed and will be discharged from wound care services. I have reached out to [facility] regarding his lymphedema pumps. They have been unable to reach the patient; the contact number they had with the patient's wife's cell phone and she has not answered any unrecognized calls. Contact  should be made today, trial planned for next week; [facility] will continue to  follow 11/27/17 on evaluation today patient has multiple blistered areas over the right lower extremity his left lower extremity appears to be doing okay. These blistered areas show signs of no infection which is great news. With that being said he did have some necrotic skin overlying which was mechanically debrided away with saline and gauze today without complication. Overall post debridement the wounds appear to be doing better but in general his swelling seems to be increased. This is obviously not good news. I think this is what has given rise to the blisters. 12/04/17 on evaluation today patient presents for follow-up concerning his bilateral lower extremity edema in the right lower extremity ulcers. He has been tolerating the dressing changes without complication. With that being said he has had no real issues with the wraps which is also good news. Overall I'm pleased with the progress he's been making. 12/11/17 on evaluation today patient appears to be doing rather well in regard to his right lateral lower extremity ulcer. He's been tolerating the dressing changes without complication. Fortunately there does not appear to be any evidence of infection at this time. Overall I'm pleased with the progress that is being made. Unfortunately he has been in the hospital due to having what sounds to be a stomach virus/flu fortunately that is starting to get better. 12/18/17 on evaluation today patient actually appears to be doing very well in regard to his bilateral lower extremities the swelling is under fairly good control his lymphedema pumps are still not up and running quite yet. With that being said he does have several areas of opening noted as far as wounds are concerned mainly over the left lower extremity. With that being said I do believe once he gets lymphedema pumps this would least help you mention some the  fluid and preventing this from occurring. Hopefully that will be set up soon sleeves are Artie in place at his home he just waiting for the machine. 12/25/17 on evaluation today patient actually appears to be doing excellent in fact all of his ulcers appear to have resolved his Adam Merritt, Adam Merritt. ([id number]) legs appear very well. I do think he needs compression stockings we have discussed this and they are actually going to go to [place] today to elastic therapy to get this fitted for him. I think that is definitely a good thing to do. Readmission: 04/09/18 upon evaluation today this patient is seen for readmission due to bilateral lower extremity lymphedema. He has significant swelling of his extremities especially on the left although the right is also swollen he has weeping from both sides. There are no obvious open wounds at this point. Fortunately he has been doing fairly well for quite a bit of time since I last saw him. Nonetheless unfortunately this seems to have reopened and is giving quite a bit of trouble. He states this began about a week ago when he first called [place] to get in to be seen. No fevers, chills, nausea, or vomiting noted at this time. He has not been using his lymphedema pumps due to the fact that they won't fit on his leg at this point likewise is also not been using his compression for essentially the same reason. 04/16/18 upon evaluation today patient actually appears to be doing a little better in regard to the fluid in his bilateral lower extremities. With that being said he's had three falls since I saw him last week. He also states that he's been feeling very poorly. I was  concerned last week and feel like that the concern is still there as far as the congestion in his chest is concerned he seems to be breathing about the same as last week but again he states he's very weak he's not even able to walk further than from the chair to the door. His wife had to buy a  wheelchair just to be able to get them out of the house to get to the appointment today. This has me very concerned. 04/23/18 on evaluation today patient actually appears to be doing much better than last week's evaluation. At that time actually had to transport him to the ER via EMS and he subsequently was admitted for acute pulmonary edema, acute renal failure, and acute congestive heart failure. Fortunately he is doing much better. Apparently they did dialyze him and were able to take off roughly 35 pounds of fluid. Nonetheless he is feeling much better both in regard to his breathing and he's able to get around much better at this time compared to previous. Overall I'm very happy with how things are at this time. There does not appear to be any evidence of infection currently. No fevers, chills, nausea, or vomiting noted at this time. 04/30/2018 patient seen today for follow-up and management of bilateral lower extremity lymphedema. He did express being more sad today than usual due to the recent loss of his dog. He states that he has been compliant with using the lymphedema pumps. However he does admit a minute over the last 2-3 days he has not been using the pumps due to the recent loss of his dog. At this time there is no drainage or open wounds to his lower extremities. The left leg edema is measuring smaller today. Still has a significant amount of edema on bilateral lower extremities With dry flaky skin. He will be referred to the lymphedema clinic for further management. Will continue 3 layer compression wraps and follow-up in 1-2 weeks.Denies any pain, fever, chairs recently. No recent falls or injuries reported during this visit. 05/07/18 on evaluation today patient actually appears to be doing very well in regard to his lower extremities in general all things considered. With that being said he is having some pain in the legs just due to the amount of swelling. He does have an area  where he had a blister on the left lateral lower extremity this is open at this point other than that there's nothing else weeping at this time. 05/14/18 on evaluation today patient actually appears to be doing excellent all things considered in regard to his lower extremities. He still has a couple areas of weeping on each leg which has continued to be the issue for him. He does have an appointment with the lymphedema clinic although this isn't until February 2020. That was the earliest they had. In the meantime he has continued to tolerate the compression wraps without complication. 05/28/18 on evaluation today patient actually appears to be doing more poorly in regard to his left lower extremity where he has a wound open at this point. He also had a fall where he subsequently injured his right great toe which has led to an open wound at the site unfortunately. He has been tolerating the dressing changes without complication in general as far as the wraps are concerned that he has not been putting any dressing on the left 1st toe ulcer site. 06/11/18 on evaluation today patient appears to be doing much worse in regard to his  bilateral lower extremity ulcers. He has been tolerating the dressing changes without complication although his legs have not been wrapped more recently. Overall I am not very pleased with the way his legs appear. I do believe he needs to be back in compression wraps he still has not received his compression wraps from the [facility] hospital as of yet. 06/18/18 on evaluation today patient actually appears to be doing significantly better than last time I saw him. He has been tolerating the compression wraps without complication in the circumferential ulcers especially appear to be doing much better. His toe ulcer on the right in regard to the great toe is better although not as good as the legs in my pinion. No fevers chills noted Adam Merritt, Adam Merritt ([id number]) 07/02/18 on evaluation  today patient appears to be doing much better in regard to his lower extremity ulcers. Unfortunately since I last saw him he's had the distal portion of his right great toe if he dated it sounds as if this actually went downhill very quickly. I had only seen him a few days prior and the toe did not appear to be infected at that point subsequently became infected very rapidly and it was decided by the surgeon that the distal portion of the toe needed to be removed. The patient seems to be doing well in this regard he tells me. With that being said his lower extremities are doing better from the standpoint of the wounds although he is significantly swollen at this point. 07/09/18 on evaluation today patient appears to be doing better in regard to the wounds on his lower extremities. In fact everything is almost completely healed he is just a small area on the left posterior lower extremity that is open at this point. 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 With that being said he needs some kind of compression it does sound like he may not be wearing his compression, that is the wraps, during the entire time between when he's here visit to visit. Apparently his wife took the current one off because it began to "fall apart". 07/16/18 on evaluation today patient appears to be extremely swollen especially in regard to his left lower extremity unfortunately. He also has a new skin tear over the left lower extremity and there's a smaller area on the right lower extremity as well. Unfortunately this seems to be due in part to blistering and fluid buildup in his leg. He did get the reduction wraps that were ordered by the [facility] hospital for him to go to lymphedema clinic. With that being said his wounds on the legs have not healed  to the point to where they would likely accept them as a patient lymphedema clinic currently. We need to try to get this to heal. With that being said he's been taking his wraps off which is not doing him any favors at this point. In fact this is probably quite counterproductive compared to what needs to occur. We will likely need to increase to a four layer compression wrap and continue to also utilize elevation and he has to keep the wraps on not take them off as he's been doing currently hasn't had a wrap on since Saturday. 07/23/18 on evaluation today patient appears to be doing much better in regard to his bilateral lower extremities.  In fact his left lower extremity which was the largest is actually 15 cm smaller today compared to what it was last time he was here in our clinic. This is obviously good news after just one week. Nonetheless the differences he actually kept the wraps on during the entire week this time. That's not typical for him. I do believe he understands a little bit better now the severity of the situation and why it's important for him to keep these wraps on. 07/30/18 on evaluation today patient actually appears to be doing rather well in regard to his lower extremities. His legs are much smaller than they have been in the past and he actually has only one very small rudder superficial region remaining that is not closed on the left lateral/posterior lower extremity even this is almost completely close. I do believe likely next week he will be healed without any complications. I do think we need to continue the wraps however this seems to be beneficial for him. I also think it may be a good time for [place] to go ahead and see about getting the appointment with the lymphedema clinic which is supposed to be made for him in order to keep this moving along and hopefully get them into compression wraps that will in the end help him to remain healed. 08/06/18 on evaluation today patient  actually appears to be doing very well in regard as bilateral lower extremities. In fact his wounds appear to be completely healed at this time. 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 He is in the process of getting appointment with the lymphedema clinic we have made this referral were just waiting to hear back on the schedule time. We need to follow up on that today as well. 08/13/18 on evaluation today patient actually appears to be doing very well in regard to his bilateral lower extremities there are no open wounds at this point. We are gonna go ahead and see about ordering the Velcro compression wraps for him had a discussion with them about [place] doing it versus the [place] they feel like they can definitely afford going ahead and get the wraps themselves and they would prefer to try to avoid having to go to the lymphedema clinic if it all possible which I completely understand. As long as he has good compression I'm okay either way. 3/17//20 on evaluation today patient actually appears to be doing well in regard to his bilateral lower extremity ulcers. He has been tolerating the dressing changes without complication specifically the compression wraps. Overall is had no issues my fingers finding that I see at this point is that he is having trouble with constipation. He tells me he has not been able to go to the bathroom for about six days. He's taken to over-the-counter oral laxatives unfortunately this is not helping. He has not contacted his doctor. 08/27/18 on evaluation today patient appears to be doing fairly well in regard to his lower extremities at this point. There does not appear to be any new altars is swelling is very well controlled. We are still waiting for his Velcro compression wraps to arrive that should be sometime in the next week is Artie sent the check for these. 09/03/18 on evaluation today patient appears to be doing  excellent in regard to his bilateral lower extremities which he shows Adam Merritt, Adam Merritt. ([id number]) no signs of wound openings in regard to this point. He does have  his Velcro compression wraps which did arrive in the mail since I saw him last week. Overall he is doing excellent in my pinion. 09/13/18 on evaluation today patient appears to be doing very well currently in regard to the overall appearance of his bilateral lower extremities although he's a little bit more swollen than last time we saw him. At that point he been discharged without any open wounds. Nonetheless he has a small open wound on the posterior left lower extremity with some evidence of cellulitis noted as well. Fortunately I feel like he has made good progress overall with regard to his lower extremities from were things used to be. 09/20/18 on evaluation today patient actually appears to be doing much better. The erythematous lower extremity is improving wound itself which is still open appears to be doing much better as far as but appearance as well as pain is concerned overall very pleased in this regard. There's no signs of active infection at this time. 09/27/18 on evaluation today patient's wounds on the lower extremity actually appear to be doing fairly well at this time which is good news. There is no evidence of active infection currently and again is just as left lower extremity were there any wounds at this point anyway. I believe they may be completely healed but again I'm not 100% sure based on evaluation today. I think one more week of observation would likely be a good idea. 10/04/18 on evaluation today patient actually appears to be doing excellent in regard to the left lower extremity which actually appears to be completely healed as of today. Unfortunately he's been having issues with his right lower extremity have a new wound that has opened. Fortunately there's no evidence of active infection at this time which is  good news. Electronic Signature(s) Signed: 10/04/2018 4:18:17 PM By: Worthy Keeler PA-C Entered By: Worthy Keeler on 10/04/2018 14:28:04 Adam Merritt ([id number]) -------------------------------------------------------------------------------- Physical Exam Details Patient Name: Adam Merritt Date of Service: 10/04/2018 1:45 PM Medical Record Number: [id number] Patient Account Number: [id number] Date of Birth/Sex: Jan 04, 1946 (73 y.o. M) Treating RN: Montey Hora Primary Care Provider: [name] Other Clinician: Referring Provider: [name] Treating Provider/Extender: STONE III, [name]: 25 Constitutional Obese and well-hydrated in no acute distress. Respiratory normal breathing without difficulty. clear to auscultation bilaterally. Cardiovascular regular rate and rhythm with normal S1, S2. Psychiatric this patient is able to make decisions and demonstrates good insight into disease process. Alert and Oriented x 3. pleasant and cooperative. Notes Patient's wound did not require sharp debridement this time it appears to be decently superficial which I think is good it should heal quite nicely. Fortunately his compression wrap seem to be doing well for him which is good news. No fevers, chills, nausea, or vomiting noted at this time. Electronic Signature(s) Signed: 10/04/2018 4:18:17 PM By: Worthy Keeler PA-C Entered By: Worthy Keeler on 10/04/2018 14:28:34 Adam Merritt ([id number]) -------------------------------------------------------------------------------- Physician Orders Details Patient Name: Adam Merritt Date of Service: 10/04/2018 1:45 PM Medical Record Number: [id number] Patient Account Number: [id number] Date of Birth/Sex: [birth year] (73 y.o. 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 o Cleanse wound with mild soap and water Primary Wound Dressing Wound #20 Right,Posterior Lower Leg o Silver Alginate Secondary Dressing Wound #20 Right,Posterior Lower Leg o Boardered Foam Dressing Dressing Change Frequency Wound #20 Right,Posterior Lower Leg o Change dressing every other day. Follow-up Appointments Wound #20 Right,Posterior Lower Leg o Return Appointment in 1 week. Edema Control Wound #20 Right,Posterior Lower Leg o Patient to wear own Velcro compression garment. o Compression Pump: Use compression pump on left lower extremity for 30 minutes, twice daily. - use your pumps for 1 hour twice daily o Compression Pump: Use compression pump on right lower extremity for 30 minutes, twice daily. - use your pumps for 1 hour twice daily Electronic Signature(s) Signed: 10/04/2018 4:18:17 PM By: Gwenyth Bender, Lovejoy ([id number]) Signed: 10/04/2018 4:21:25 PM By: Montey Hora Entered By: Montey Hora on 10/04/2018 14:23:29 Adam Merritt ([id number]) -------------------------------------------------------------------------------- Problem List Details Patient Name: Adam Merritt Date of Service: 10/04/2018 1:45 PM Medical Record Number: [id number] Patient Account Number: [id number] Date of Birth/Sex: [birth year] (73 y.o. M) Treating RN: Montey Hora Primary Care Provider: [name] Other Clinician: Referring Provider: [name] Treating Provider/Extender: Melburn Hake,  [name]: 25 Active Problems ICD-10 Evaluated Encounter Code Description Active Date Today Diagnosis I89.0 Lymphedema, not elsewhere classified 04/09/2018 No Yes L97.512 Non-pressure chronic ulcer of other part of right foot with fat 05/28/2018 No Yes layer exposed L97.822 Non-pressure chronic ulcer of other part of left lower leg with 05/28/2018 No Yes fat layer exposed L98.492 Non-pressure chronic ulcer of skin of other sites with fat layer 06/11/2018 No Yes exposed E66.9 Obesity, unspecified 04/09/2018 No Yes Inactive Problems Resolved Problems ICD-10 Code Description Active Date Resolved Date L03.115 Cellulitis of right lower limb 04/09/2018 04/09/2018 L03.116 Cellulitis of left lower limb 04/09/2018 04/09/2018 Electronic Signature(s) Signed: 10/04/2018 4:18:17 PM By: Worthy Keeler PA-C Entered By: Worthy Keeler on 10/04/2018 14:00:19 Adam Merritt ([id number]) Adam Merritt, Adam Merritt ([id number]) -------------------------------------------------------------------------------- Progress Note Details Patient Name: Adam Merritt Date of Service: 10/04/2018 1:45 PM Medical Record Number: [id number] Patient Account Number: [id number] Date of Birth/Sex: 1945-12-31 (73 y.o. M) Treating RN: Montey Hora Primary Care Provider: [name] Other Clinician: Referring Provider: [name] Treating Provider/Extender: Melburn Hake, [name]: 25 Subjective Chief Complaint Information obtained from Patient he is here for evaluation of bilateral lower extremity lymphedema right right toe and left LE ulcers History of Present Illness (HPI) 10/18/17-He is here for initial evaluation of bilateral lower extremity ulcerations in the presence of venous insufficiency and lymphedema. He has been seen by vascular medicine in the past, Dr. Lucky Cowboy, last seen in 2016. He does have a history of abnormal ABIs, which is to be expected given his lymphedema and venous insufficiency. According  to Epic, it appears that all attempts for arterial evaluation and/or angiography were not follow through with by patient. He does have a history of being seen in lymphedema clinic in 2018, stopped going approximately 6 months ago stating "it didn't do any good". He does not have lymphedema pumps, he does not have custom fit compression wrap/stockings. He is diabetic and his recent A1c last month was 7.6. He admits to chronic bilateral lower extremity pain, no change in pain since blister and ulceration development. He  is currently being treated with Levaquin for bronchitis. He has home health and we will continue. 10/25/17-He is here in follow-up evaluation for bilateral lower extremity ulcerationssubtle he remains on Levaquin for bronchitis. Right lower extremity with no evidence of drainage or ulceration, persistent left lower extremity ulceration. He states that home health has not been out since his appointment. He went to [facility] on Tuesday, studies revealed: RIGHT ABI 0.9, TBI 0.6 LEFT ABI 1.1, TBI 0.6 with triphasic flow bilaterally. We will continue his same treatment plan. He has been educated on compression therapy and need for elevation. He will benefit from lymphedema pumps 11/01/17-He is here in follow-up evaluation for left lower extremity ulcer. The right lower extremity remains healed. He has home health services, but they have not been out to see the patient for 2-3 weeks. He states it home health physical therapy changed his dressing yesterday after therapy; he placed Ace wrap compression. We are still waiting for lymphedema pumps, reordered d/t need for company change. 11/08/17-He is here in follow-up evaluation for left lower extremity ulcer. It is improved. Edema is significantly improved with compression therapy. We will continue with same treatment plan and he will follow-up next week. No word regarding lymphedema pumps 11/15/17-He is here in follow-up  evaluation for left lower extremity ulcer. He is healed and will be discharged from wound care services. I have reached out to [facility] regarding his lymphedema pumps. They have been unable to reach the patient; the contact number they had with the patient's wife's cell phone and she has not answered any unrecognized calls. Contact should be made today, trial planned for next week; [facility] will continue to follow 11/27/17 on evaluation today patient has multiple blistered areas over the right lower extremity his left lower extremity appears to be doing okay. These blistered areas show signs of no infection which is great news. With that being said he did have some necrotic skin overlying which was mechanically debrided away with saline and gauze today without complication. Overall post debridement the wounds appear to be doing better but in general his swelling seems to be increased. This is obviously not good news. I think this is what has given rise to the blisters. 12/04/17 on evaluation today patient presents for follow-up concerning his bilateral lower extremity edema in the right lower extremity ulcers. He has been tolerating the dressing changes without complication. With that being said he has had no real issues with the wraps which is also good news. Overall I'm pleased with the progress he's been making. 12/11/17 on evaluation today patient appears to be doing rather well in regard to his right lateral lower extremity ulcer. He's been tolerating the dressing changes without complication. Fortunately there does not appear to be any evidence of infection at this time. Overall I'm pleased with the progress that is being made. Unfortunately he has been in the hospital due to having what sounds to be a stomach virus/flu fortunately that is starting to get better. 12/18/17 on evaluation today patient actually appears to be doing very well in regard to his bilateral lower extremities  the Adam Merritt, Adam Merritt. ([id number]) swelling is under fairly good control his lymphedema pumps are still not up and running quite yet. With that being said he does have several areas of opening noted as far as wounds are concerned mainly over the left lower extremity. With that being said I do believe once he gets lymphedema pumps this would least help you  mention some the fluid and preventing this from occurring. Hopefully that will be set up soon sleeves are Artie in place at his home he just waiting for the machine. 12/25/17 on evaluation today patient actually appears to be doing excellent in fact all of his ulcers appear to have resolved his legs appear very well. I do think he needs compression stockings we have discussed this and they are actually going to go to [place] today to elastic therapy to get this fitted for him. I think that is definitely a good thing to do. Readmission: 04/09/18 upon evaluation today this patient is seen for readmission due to bilateral lower extremity lymphedema. He has significant swelling of his extremities especially on the left although the right is also swollen he has weeping from both sides. There are no obvious open wounds at this point. Fortunately he has been doing fairly well for quite a bit of time since I last saw him. Nonetheless unfortunately this seems to have reopened and is giving quite a bit of trouble. He states this began about a week ago when he first called [place] to get in to be seen. No fevers, chills, nausea, or vomiting noted at this time. He has not been using his lymphedema pumps due to the fact that they won't fit on his leg at this point likewise is also not been using his compression for essentially the same reason. 04/16/18 upon evaluation today patient actually appears to be doing a little better in regard to the fluid in his bilateral lower extremities. With that being said he's had three falls since I saw him last week. He also states  that he's been feeling very poorly. I was concerned last week and feel like that the concern is still there as far as the congestion in his chest is concerned he seems to be breathing about the same as last week but again he states he's very weak he's not even able to walk further than from the chair to the door. His wife had to buy a wheelchair just to be able to get them out of the house to get to the appointment today. This has me very concerned. 04/23/18 on evaluation today patient actually appears to be doing much better than last week's evaluation. At that time actually had to transport him to the ER via EMS and he subsequently was admitted for acute pulmonary edema, acute renal failure, and acute congestive heart failure. Fortunately he is doing much better. Apparently they did dialyze him and were able to take off roughly 35 pounds of fluid. Nonetheless he is feeling much better both in regard to his breathing and he's able to get around much better at this time compared to previous. Overall I'm very happy with how things are at this time. There does not appear to be any evidence of infection currently. No fevers, chills, nausea, or vomiting noted at this time. 04/30/2018 patient seen today for follow-up and management of bilateral lower extremity lymphedema. He did express being more sad today than usual due to the recent loss of his dog. He states that he has been compliant with using the lymphedema pumps. However he does admit a minute over the last 2-3 days he has not been using the pumps due to the recent loss of his dog. At this time there is no drainage or open wounds to his lower extremities. The left leg edema is measuring smaller today. Still has a significant amount of edema on bilateral lower  extremities With dry flaky skin. He will be referred to the lymphedema clinic for further management. Will continue 3 layer compression wraps and follow-up in 1-2 weeks.Denies any pain,  fever, chairs recently. No recent falls or injuries reported during this visit. 05/07/18 on evaluation today patient actually appears to be doing very well in regard to his lower extremities in general all things considered. With that being said he is having some pain in the legs just due to the amount of swelling. He does have an area where he had a blister on the left lateral lower extremity this is open at this point other than that there's nothing else weeping at this time. 05/14/18 on evaluation today patient actually appears to be doing excellent all things considered in regard to his lower extremities. He still has a couple areas of weeping on each leg which has continued to be the issue for him. He does have an appointment with the lymphedema clinic although this isn't until February 2020. That was the earliest they had. In the meantime he has continued to tolerate the compression wraps without complication. 05/28/18 on evaluation today patient actually appears to be doing more poorly in regard to his left lower extremity where he has a wound open at this point. He also had a fall where he subsequently injured his right great toe which has led to an open wound at the site unfortunately. He has been tolerating the dressing changes without complication in general as far as the wraps are concerned that he has not been putting any dressing on the left 1st toe ulcer site. 06/11/18 on evaluation today patient appears to be doing much worse in regard to his bilateral lower extremity ulcers. He has been tolerating the dressing changes without complication although his legs have not been wrapped more recently. Overall I am not very pleased with the way his legs appear. I do believe he needs to be back in compression wraps he still has not received his compression wraps from the [facility] hospital as of yet. Adam Merritt, Adam Merritt ([id number]) 06/18/18 on evaluation today patient actually appears to be doing  significantly better than last time I saw him. He has been tolerating the compression wraps without complication in the circumferential ulcers especially appear to be doing much better. His toe ulcer on the right in regard to the great toe is better although not as good as the legs in my pinion. No fevers chills noted 07/02/18 on evaluation today patient appears to be doing much better in regard to his lower extremity ulcers. Unfortunately since I last saw him he's had the distal portion of his right great toe if he dated it sounds as if this actually went downhill very quickly. I had only seen him a few days prior and the toe did not appear to be infected at that point subsequently became infected very rapidly and it was decided by the surgeon that the distal portion of the toe needed to be removed. The patient seems to be doing well in this regard he tells me. With that being said his lower extremities are doing better from the standpoint of the wounds although he is significantly swollen at this point. 07/09/18 on evaluation today patient appears to be doing better in regard to the wounds on his lower extremities. In fact everything is almost completely healed he is just a small area on the left posterior lower extremity that is open at this point. 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 With that being said he needs some kind of compression it does sound like he may not be wearing his compression, that is the wraps, during the entire time between when he's here visit to visit. Apparently his wife took the current one off because it began to "fall apart". 07/16/18 on evaluation today patient appears to be extremely swollen especially in regard to his left lower extremity unfortunately. He also has a new skin tear  over the left lower extremity and there's a smaller area on the right lower extremity as well. Unfortunately this seems to be due in part to blistering and fluid buildup in his leg. He did get the reduction wraps that were ordered by the [facility] hospital for him to go to lymphedema clinic. With that being said his wounds on the legs have not healed to the point to where they would likely accept them as a patient lymphedema clinic currently. We need to try to get this to heal. With that being said he's been taking his wraps off which is not doing him any favors at this point. In fact this is probably quite counterproductive compared to what needs to occur. We will likely need to increase to a four layer compression wrap and continue to also utilize elevation and he has to keep the wraps on not take them off as he's been doing currently hasn't had a wrap on since Saturday. 07/23/18 on evaluation today patient appears to be doing much better in regard to his bilateral lower extremities. In fact his left lower extremity which was the largest is actually 15 cm smaller today compared to what it was last time he was here in our clinic. This is obviously good news after just one week. Nonetheless the differences he actually kept the wraps on during the entire week this time. That's not typical for him. I do believe he understands a little bit better now the severity of the situation and why it's important for him to keep these wraps on. 07/30/18 on evaluation today patient actually appears to be doing rather well in regard to his lower extremities. His legs are much smaller than they have been in the past and he actually has only one very small rudder superficial region remaining that is not closed on the left lateral/posterior lower extremity even this is almost completely close. I do believe likely next week he will be healed without any complications. I do think we need to continue the wraps however this seems  to be beneficial for him. I also think it may be a good time for [place] to go ahead and see about getting the appointment with the lymphedema clinic which is supposed to be made for him in order to keep this moving along and hopefully get them into compression wraps that will in the end help him to remain healed. 08/06/18 on evaluation today patient actually appears to be doing very well in regard as bilateral lower extremities. In fact his wounds appear to be completely healed at this time. 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 He is in the process of getting appointment with the lymphedema clinic we have made this referral were just waiting to hear back on the schedule time. We need to follow up on that today as  well. 08/13/18 on evaluation today patient actually appears to be doing very well in regard to his bilateral lower extremities there are no open wounds at this point. We are gonna go ahead and see about ordering the Velcro compression wraps for him had a discussion with them about [place] doing it versus the [place] they feel like they can definitely afford going ahead and get the wraps themselves and they would prefer to try to avoid having to go to the lymphedema clinic if it all possible which I completely understand. As long as he has good compression I'm okay either way. 3/17//20 on evaluation today patient actually appears to be doing well in regard to his bilateral lower extremity ulcers. He has been tolerating the dressing changes without complication specifically the compression wraps. Overall is had no issues my fingers finding that I see at this point is that he is having trouble with constipation. He tells me he has not been able to go to the bathroom for about six days. He's taken to over-the-counter oral laxatives unfortunately this is not helping. He has not contacted his doctor. Adam Merritt, Adam Merritt ([id number]) 08/27/18 on  evaluation today patient appears to be doing fairly well in regard to his lower extremities at this point. There does not appear to be any new altars is swelling is very well controlled. We are still waiting for his Velcro compression wraps to arrive that should be sometime in the next week is Artie sent the check for these. 09/03/18 on evaluation today patient appears to be doing excellent in regard to his bilateral lower extremities which he shows no signs of wound openings in regard to this point. He does have his Velcro compression wraps which did arrive in the mail since I saw him last week. Overall he is doing excellent in my pinion. 09/13/18 on evaluation today patient appears to be doing very well currently in regard to the overall appearance of his bilateral lower extremities although he's a little bit more swollen than last time we saw him. At that point he been discharged without any open wounds. Nonetheless he has a small open wound on the posterior left lower extremity with some evidence of cellulitis noted as well. Fortunately I feel like he has made good progress overall with regard to his lower extremities from were things used to be. 09/20/18 on evaluation today patient actually appears to be doing much better. The erythematous lower extremity is improving wound itself which is still open appears to be doing much better as far as but appearance as well as pain is concerned overall very pleased in this regard. There's no signs of active infection at this time. 09/27/18 on evaluation today patient's wounds on the lower extremity actually appear to be doing fairly well at this time which is good news. There is no evidence of active infection currently and again is just as left lower extremity were there any wounds at this point anyway. I believe they may be completely healed but again I'm not 100% sure based on evaluation today. I think one more week of observation would likely be a good  idea. 10/04/18 on evaluation today patient actually appears to be doing excellent in regard to the left lower extremity which actually appears to be completely healed as of today. Unfortunately he's been having issues with his right lower extremity have a new wound that has opened. Fortunately there's no evidence of active infection at this time which is good news. Patient History Information  obtained from Patient. Family History Cancer - Paternal Grandparents, Kidney Disease - Siblings, Lung Disease - Mother, No family history of Diabetes, Heart Disease, Hereditary Spherocytosis, Hypertension, Seizures, Stroke, Thyroid Problems, Tuberculosis. Social History Never smoker, Marital Status - Married, Alcohol Use - Never, Drug Use - No History, Caffeine Use - Never. Medical History Eyes Denies history of Cataracts, Glaucoma, Optic Neuritis Ear/Nose/Mouth/Throat Denies history of Chronic sinus problems/congestion, Middle ear problems Hematologic/Lymphatic Patient has history of Anemia - aplastic anemia, Lymphedema Denies history of Hemophilia, Human Immunodeficiency Virus, Sickle Cell Disease Respiratory Patient has history of Chronic Obstructive Pulmonary Disease (COPD), Sleep Apnea Denies history of Aspiration, Asthma, Pneumothorax, Tuberculosis Cardiovascular Patient has history of Congestive Heart Failure, Coronary Artery Disease, Hypertension, Peripheral Venous Disease Denies history of Angina, Arrhythmia, Deep Vein Thrombosis, Hypotension, Myocardial Infarction, Peripheral Arterial Disease, Phlebitis, Vasculitis Gastrointestinal Denies history of Cirrhosis , Colitis, Crohn s, Hepatitis A, Hepatitis B, Hepatitis C Endocrine Patient has history of Type II Diabetes Denies history of Type I Diabetes Adam Merritt, Adam Merritt ([id number]) Genitourinary Denies history of End Stage Renal Disease Immunological Denies history of Lupus Erythematosus, Raynaud s, Scleroderma Integumentary (Skin) Denies  history of History of Burn, History of pressure wounds Musculoskeletal Patient has history of Osteoarthritis Denies history of Gout, Rheumatoid Arthritis, Osteomyelitis Neurologic Patient has history of Neuropathy Denies history of Dementia, Quadriplegia, Paraplegia, Seizure Disorder Oncologic Patient has history of Received Chemotherapy - last dose 2006 Denies history of Received Radiation Psychiatric Denies history of Anorexia/bulimia, Confinement Anxiety Hospitalization/Surgery History - bronchitis. Medical And Surgical History Notes Respiratory home O2 at night as needed Cardiovascular varicose veins Neurologic CVA - 1992 Review of Systems (ROS) Constitutional Symptoms (General Health) Denies complaints or symptoms of Fatigue, Fever, Chills, Marked Weight Change. Respiratory Denies complaints or symptoms of Chronic or frequent coughs, Shortness of Breath. Cardiovascular Complains or has symptoms of LE edema. Psychiatric Denies complaints or symptoms of Anxiety, Claustrophobia. Objective Constitutional Obese and well-hydrated in no acute distress. Vitals Time Taken: 1:49 PM, Height: 66 in, Weight: 323 lbs, BMI: 52.1, Temperature: 98.3 F, Pulse: 76 bpm, Respiratory Rate: 18 breaths/min, Blood Pressure: 152/69 mmHg. Respiratory normal breathing without difficulty. clear to auscultation bilaterally. Cardiovascular Adam Merritt, Adam Merritt ([id number]) regular rate and rhythm with normal S1, S2. Psychiatric this patient is able to make decisions and demonstrates good insight into disease process. Alert and Oriented x 3. pleasant and cooperative. General Notes: Patient's wound did not require sharp debridement this time it appears to be decently superficial which I think is good it should heal quite nicely. Fortunately his compression wrap seem to be doing well for him which is good news. No fevers, chills, nausea, or vomiting noted at this time. Integumentary (Hair, Skin) Wound  #19 status is Healed - Epithelialized. Original cause of wound was Gradually Appeared. The wound is located on the Left,Distal,Posterior Lower Leg. The wound measures 0cm length x 0cm width x 0cm depth; 0cm^2 area and 0cm^3 volume. The wound is limited to skin breakdown. There is no tunneling or undermining noted. There is a medium amount of serous drainage noted. The wound margin is flat and intact. There is large (67-100%) pink granulation within the wound bed. There is no necrotic tissue within the wound bed. The periwound skin appearance exhibited: Hemosiderin Staining. The periwound skin appearance did not exhibit: Callus, Crepitus, Excoriation, Induration, Rash, Scarring, Dry/Scaly, Maceration, Atrophie Blanche, Cyanosis, Ecchymosis, Mottled, Pallor, Rubor, Erythema. Periwound temperature was noted as No Abnormality. Wound #20 status is Open. Original cause of wound was Gradually  Appeared. The wound is located on the Right,Posterior Lower Leg. The wound measures 5cm length x 4cm width x 0.1cm depth; 15.708cm^2 area and 1.571cm^3 volume. There is Fat Layer (Subcutaneous Tissue) Exposed exposed. There is no tunneling or undermining noted. There is a small amount of serosanguineous drainage noted. The wound margin is flat and intact. There is medium (34-66%) pink granulation within the wound bed. There is a small (1-33%) amount of necrotic tissue within the wound bed including Adherent Slough. 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 Cleanse wound with mild soap and water Primary Wound Dressing: Wound #20  Right,Posterior Lower Leg: Silver Alginate Secondary Dressing: Wound #20 Right,Posterior Lower Leg: Boardered Foam Dressing Dressing Change Frequency: ALVINO, Adam Merritt ([id number]) Wound #20 Right,Posterior Lower Leg: Change dressing every other day. Follow-up Appointments: Wound #20 Right,Posterior Lower Leg: Return Appointment in 1 week. Edema Control: Wound #20 Right,Posterior Lower Leg: Patient to wear own Velcro compression garment. Compression Pump: Use compression pump on left lower extremity for 30 minutes, twice daily. - use your pumps for 1 hour twice daily Compression Pump: Use compression pump on right lower extremity for 30 minutes, twice daily. - use your pumps for 1 hour twice daily My suggestion is gonna be that we go ahead and continue with the above wound to measures for the next week and the patient is in agreement with plan. If anything changes worsens meantime he will contact the office and let me know. Otherwise will see were things stand at follow-up. Please see above for specific wound care orders. We will see patient for re-evaluation in 1 week(s) here in the clinic. If anything worsens or changes patient will contact our office for additional recommendations. Electronic Signature(s) Signed: 10/04/2018 4:18:17 PM By: Worthy Keeler PA-C Entered By: Worthy Keeler on 10/04/2018 14:31:46 Adam Merritt ([id number]) -------------------------------------------------------------------------------- ROS/PFSH Details Patient Name: Adam Merritt Date of Service: 10/04/2018 1:45 PM Medical Record Number: [id number] Patient Account Number: [id number] Date of Birth/Sex: 11/04/1945 (73 y.o. M) Treating RN: Montey Hora Primary Care Provider: [name] Other Clinician: Referring Provider: [name] Treating Provider/Extender: Melburn Hake, [name]: 25 Information Obtained From Patient Constitutional Symptoms (General Health) Complaints and  Symptoms: Negative for: Fatigue; Fever; Chills; Marked Weight Change Respiratory Complaints and Symptoms: Negative for: Chronic or frequent coughs; Shortness of Breath Medical History: Positive for: Chronic Obstructive Pulmonary Disease (COPD); Sleep Apnea Negative for: Aspiration; Asthma; Pneumothorax; Tuberculosis Past Medical History Notes: home O2 at night as needed Cardiovascular Complaints and Symptoms: Positive for: LE edema Medical History: Positive for: Congestive Heart Failure; Coronary Artery Disease; Hypertension; Peripheral Venous Disease Negative for: Angina; Arrhythmia; Deep Vein Thrombosis; Hypotension; Myocardial Infarction; Peripheral Arterial Disease; Phlebitis; Vasculitis Past Medical History Notes: varicose veins Psychiatric Complaints and Symptoms: Negative for: Anxiety; Claustrophobia Medical History: Negative for: Anorexia/bulimia; Confinement Anxiety Eyes Medical History: Negative for: Cataracts; Glaucoma; Optic Neuritis Ear/Nose/Mouth/Throat Medical History: Negative for: Chronic sinus problems/congestion; Middle ear problems Hematologic/Lymphatic Adam Merritt, PASION ([id number]) Medical History: Positive for: Anemia - aplastic anemia; Lymphedema Negative for: Hemophilia; Human Immunodeficiency Virus; Sickle Cell Disease Gastrointestinal Medical History: Negative for: Cirrhosis ; Colitis; Crohnos; Hepatitis A; Hepatitis B; Hepatitis C Endocrine  Medical History: Positive for: Type II Diabetes Negative for: Type I Diabetes Time with diabetes: since 2006 Treated with: Insulin Blood sugar tested every day: Yes Tested : Blood sugar testing results: Breakfast: 209 Genitourinary Medical History: Negative for: End Stage Renal Disease Immunological Medical History: Negative for: Lupus Erythematosus; Raynaudos; Scleroderma Integumentary (Skin) Medical History: Negative for: History of Burn; History of pressure wounds Musculoskeletal Medical  History: Positive for: Osteoarthritis Negative for: Gout; Rheumatoid Arthritis; Osteomyelitis Neurologic Medical History: Positive for: Neuropathy Negative for: Dementia; Quadriplegia; Paraplegia; Seizure Disorder Past Medical History Notes: CVA - 1992 Oncologic Medical History: Positive for: Received Chemotherapy - last dose 2006 Negative for: Received Radiation Immunizations Pneumococcal Vaccine: Received Pneumococcal Vaccination: Yes KHOLTON, COATE ([id number]) Implantable Devices No devices added Hospitalization / Surgery History Type of Hospitalization/Surgery bronchitis Family and Social History Cancer: Yes - Paternal Grandparents; Diabetes: No; Heart Disease: No; Hereditary Spherocytosis: No; Hypertension: No; Kidney Disease: Yes - Siblings; Lung Disease: Yes - Mother; Seizures: No; Stroke: No; Thyroid Problems: No; Tuberculosis: No; Never smoker; Marital Status - Married; Alcohol Use: Never; Drug Use: No History; Caffeine Use: Never; Financial Concerns: No; Food, Clothing or Shelter Needs: No; Support System Lacking: No; Transportation Concerns: No Physician Affirmation I have reviewed and agree with the above information. Electronic Signature(s) Signed: 10/04/2018 4:18:17 PM By: Worthy Keeler PA-C Signed: 10/04/2018 4:21:25 PM By: Montey Hora Entered By: Worthy Keeler on 10/04/2018 14:28:18 ANTE, ARREDONDO ([id number]) -------------------------------------------------------------------------------- SuperBill Details Patient Name: Adam Merritt Date of Service: 10/04/2018 Medical Record Number: [id number] Patient Account Number: [id number] Date of Birth/Sex: May 18, 1946 (73 y.o. M) Treating RN: Montey Hora Primary Care Provider: [name] Other Clinician: Referring Provider: [name] Treating Provider/Extender: Melburn Hake, [name]: 25 Diagnosis Coding ICD-10 Codes Code Description I89.0 Lymphedema, not elsewhere classified L97.512  Non-pressure chronic ulcer of other part of right foot with fat layer exposed L97.822 Non-pressure chronic ulcer of other part of left lower leg with fat layer exposed L98.492 Non-pressure chronic ulcer of skin of other sites with fat layer exposed E66.9 Obesity, unspecified Facility Procedures CPT4 Code: [id number] Description: 99213 - WOUND CARE VISIT-LEV 3 EST PT Modifier: Quantity: 1 Physician Procedures CPT4 Code Description: [id number] 99214 - WC PHYS LEVEL 4 - EST PT ICD-10 Diagnosis Description I89.0 Lymphedema, not elsewhere classified L97.512 Non-pressure chronic ulcer of other part of right foot with fat L97.822 Non-pressure chronic ulcer of other  part of left lower leg with L98.492 Non-pressure chronic ulcer of skin of other sites with fat layer Modifier: layer exposed fat layer expos exposed Quantity: 1 ed Electronic Signature(s) Signed: 10/04/2018 4:18:17 PM By: Worthy Keeler PA-C Previous Signature: 10/04/2018 2:31:44 PM Version By: Montey Hora Entered By: Worthy Keeler on 10/04/2018 14:31:59

## 2018-10-08 ENCOUNTER — Other Ambulatory Visit: Payer: Self-pay | Admitting: Family

## 2018-10-08 DIAGNOSIS — I890 Lymphedema, not elsewhere classified: Secondary | ICD-10-CM

## 2018-10-10 ENCOUNTER — Other Ambulatory Visit: Payer: Self-pay

## 2018-10-10 ENCOUNTER — Encounter: Payer: Medicare Other | Admitting: Physician Assistant

## 2018-10-10 DIAGNOSIS — E11621 Type 2 diabetes mellitus with foot ulcer: Secondary | ICD-10-CM | POA: Diagnosis not present

## 2018-10-10 NOTE — Progress Notes (Signed)
Adam Merritt ([id number]) Visit Report for 10/04/2018 Arrival Information Details Patient Name: Adam Merritt, Adam Merritt Date of Service: 10/04/2018 1:45 PM Medical Record Number: [id number] Patient Account Number: [id number] Date of Birth/Sex: November 14, 1945 (73 y.o. M) Treating RN: Harold Barban Primary Care Emari Demmer: [name] Other Clinician: Referring Everleigh Colclasure: [name] Treating Alaijah Gibler/Extender: Melburn Hake, [name]: 25 Visit Information History Since Last Visit Added or deleted any medications: No Patient Arrived: Wheel Chair Any new allergies or adverse reactions: No Arrival Time: 13:47 Had a fall or experienced change in No Accompanied By: self activities of daily living that may affect Transfer Assistance: None risk of falls: Patient Identification Verified: Yes Signs or symptoms of abuse/neglect since last visito No Secondary Verification Process Yes Hospitalized since last visit: No Completed: Has Dressing in Place as Prescribed: Yes Patient Has Alerts: Yes Has Compression in Place as Prescribed: Yes Patient Alerts: Patient on Blood Pain Present Now: No Thinner Aspirin 325mg  Type II Diabetes ABI 5/19 L 1.1 R 0.9 TBI L 0.6 R 0.6 Electronic Signature(s) Signed: 10/10/2018 8:05:03 AM By: Harold Barban Entered By: Harold Barban on 10/04/2018 13:47:32 Adam Merritt ([id number]) -------------------------------------------------------------------------------- Clinic Level of Care Assessment Details Patient Name: Adam Merritt Date of Service: 10/04/2018 1:45 PM Medical Record Number: [id number] Patient Account Number: [id number] Date of Birth/Sex: Feb 24, 1946 (73 y.o. M) Treating RN: Montey Hora Primary Care Rashun Grattan: [name] Other Clinician: Referring Itha Kroeker: [name] Treating Clydell Alberts/Extender: Melburn Hake, [name]: 25 Clinic Level of Care Assessment Items TOOL 4 Quantity Score []  - Use when only an EandM is performed on  FOLLOW-UP visit 0 ASSESSMENTS - Nursing Assessment / Reassessment X - Reassessment of Co-morbidities (includes updates in patient status) 1 10 X- 1 5 Reassessment of Adherence to Treatment Plan ASSESSMENTS - Wound and Skin Assessment / Reassessment []  - Simple Wound Assessment / Reassessment - one wound 0 X- 2 5 Complex Wound Assessment / Reassessment - multiple wounds []  - 0 Dermatologic / Skin Assessment (not related to wound area) ASSESSMENTS - Focused Assessment X - Circumferential Edema Measurements - multi extremities 2 5 []  - 0 Nutritional Assessment / Counseling / Intervention X- 1 5 Lower Extremity Assessment (monofilament, tuning fork, pulses) []  - 0 Peripheral Arterial Disease Assessment (using hand held doppler) ASSESSMENTS - Ostomy and/or Continence Assessment and Care []  - Incontinence Assessment and Management 0 []  - 0 Ostomy Care Assessment and Management (repouching, etc.) PROCESS - Coordination of Care X - Simple Patient / Family Education for ongoing care 1 15 []  - 0 Complex (extensive) Patient / Family Education for ongoing care X- 1 10 Staff obtains Programmer, systems, Records, Test Results / Process Orders []  - 0 Staff telephones HHA, Nursing Homes / Clarify orders / etc []  - 0 Routine Transfer to another Facility (non-emergent condition) []  - 0 Routine Hospital Admission (non-emergent condition) []  - 0 New Admissions / Biomedical engineer / Ordering NPWT, Apligraf, etc. []  - 0 Emergency Hospital Admission (emergent condition) X- 1 10 Simple Discharge Coordination DONDRELL, LOUDERMILK ([id number]) []  - 0 Complex (extensive) Discharge Coordination PROCESS - Special Needs []  - Pediatric / Minor Patient Management 0 []  - 0 Isolation Patient Management []  - 0 Hearing / Language / Visual special needs []  - 0 Assessment of Community assistance (transportation, D/C planning, etc.) []  - 0 Additional assistance / Altered mentation []  - 0 Support Surface(s)  Assessment (bed, cushion, seat, etc.) INTERVENTIONS - Wound Cleansing / Measurement []  - Simple Wound Cleansing - one wound 0  X- 2 5 Complex Wound Cleansing - multiple wounds X- 1 5 Wound Imaging (photographs - any number of wounds) []  - 0 Wound Tracing (instead of photographs) []  - 0 Simple Wound Measurement - one wound X- 2 5 Complex Wound Measurement - multiple wounds INTERVENTIONS - Wound Dressings X - Small Wound Dressing one or multiple wounds 1 10 []  - 0 Medium Wound Dressing one or multiple wounds []  - 0 Large Wound Dressing one or multiple wounds []  - 0 Application of Medications - topical []  - 0 Application of Medications - injection INTERVENTIONS - Miscellaneous []  - External ear exam 0 []  - 0 Specimen Collection (cultures, biopsies, blood, body fluids, etc.) []  - 0 Specimen(s) / Culture(s) sent or taken to Lab for analysis []  - 0 Patient Transfer (multiple staff / Civil Service fast streamer / Similar devices) []  - 0 Simple Staple / Suture removal (25 or less) []  - 0 Complex Staple / Suture removal (26 or more) []  - 0 Hypo / Hyperglycemic Management (close monitor of Blood Glucose) []  - 0 Ankle / Brachial Index (ABI) - do not check if billed separately X- 1 5 Vital Signs MARKELL, SCIASCIA ([id number]) Has the patient been seen at the hospital within the last three years: Yes Total Score: 115 Level Of Care: ____ Electronic Signature(s) Signed: 10/04/2018 4:21:25 PM By: Montey Hora Entered By: Montey Hora on 10/04/2018 14:31:35 Adam Merritt ([id number]) -------------------------------------------------------------------------------- Encounter Discharge Information Details Patient Name: Adam Merritt Date of Service: 10/04/2018 1:45 PM Medical Record Number: [id number] Patient Account Number: [id number] Date of Birth/Sex: Mar 07, 1946 (73 y.o. M) Treating RN: Cornell Barman Primary Care Deshan Hemmelgarn: [name] Other Clinician: Referring Pinchus Weckwerth: [name] Treating  Daksha Koone/Extender: Melburn Hake, [name]: 25 Encounter Discharge Information Items Discharge Condition: Stable Ambulatory Status: Walker Discharge Destination: Home Transportation: Private Auto Accompanied By: wife Schedule Follow-up Appointment: Yes Clinical Summary of Care: Electronic Signature(s) Signed: 10/04/2018 4:58:36 PM By: Gretta Cool, BSN, RN, CWS, Kim RN, BSN Entered By: Gretta Cool, BSN, RN, CWS, Kim on 10/04/2018 14:33:22 Adam Merritt ([id number]) -------------------------------------------------------------------------------- Lower Extremity Assessment Details Patient Name: Adam Merritt Date of Service: 10/04/2018 1:45 PM Medical Record Number: [id number] Patient Account Number: [id number] Date of Birth/Sex: 01-27-1946 (73 y.o. M) Treating RN: Harold Barban Primary Care Lijah Bourque: [name] Other Clinician: Referring Teylor Wolven: [name] Treating Gorje Iyer/Extender: Melburn Hake, [name]: 25 Edema Assessment Assessed: [Left: No] [Right: No] [Left: Edema] [Right: :] Calf Left: Right: Point of Measurement: 37 cm From Medial Instep 56.2 cm 53 cm Ankle Left: Right: Point of Measurement: 12 cm From Medial Instep 40 cm 35.6 cm Vascular Assessment Pulses: Dorsalis Pedis Palpable: [Left:Yes] [Right:Yes] Posterior Tibial Palpable: [Left:Yes] [Right:Yes] Electronic Signature(s) Signed: 10/10/2018 8:05:03 AM By: Harold Barban Entered By: Harold Barban on 10/04/2018 14:03:55 Adam Merritt ([id number]) -------------------------------------------------------------------------------- Multi Wound Chart Details Patient Name: Adam Merritt Date of Service: 10/04/2018 1:45 PM Medical Record Number: [id number] Patient Account Number: [id number] Date of Birth/Sex: January 19, 1946 (73 y.o. 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 Extremity Comorbid History: Anemia,  Lymphedema, Anemia, Lymphedema, N/A Chronic Obstructive Chronic Obstructive Pulmonary Disease (COPD), Pulmonary Disease (COPD), Sleep Apnea, Congestive Sleep Apnea, Congestive Heart Failure, Coronary Artery Heart Failure, Coronary Artery Disease, Hypertension, Disease, Hypertension, Peripheral Venous Disease, Peripheral Venous Disease, Type II Diabetes, Type II Diabetes, Osteoarthritis, Neuropathy, Osteoarthritis, Neuropathy, Received Chemotherapy Received Chemotherapy Date Acquired: 09/12/2018 10/02/2018 N/A Weeks of Treatment: 3 0 N/A Wound Status: Open Open N/A Measurements L x W x D 0.1x0.1x0.1 5x4x0.1 N/A (cm) Area (cm) : 0.008 15.708 N/A Volume (cm) : 0.001 1.571 N/A % Reduction in Area: 99.70% N/A N/A % Reduction in Volume: 99.60% N/A N/A Classification: Grade 1 Grade 2 N/A Exudate Amount: Medium Small N/A Exudate Type: Serous Serosanguineous N/A Exudate Color: amber red, brown N/A Wound Margin: Flat and Intact Flat and Intact N/A REASE, WENCE. ([id number]) Granulation Amount: Large (67-100%) Medium (34-66%) N/A Granulation Quality: Pink Pink N/A Necrotic Amount: None Present (0%) Small (1-33%) N/A Exposed Structures: Fascia: No Fat Layer (Subcutaneous N/A Fat Layer (Subcutaneous Tissue) Exposed: Yes Tissue) Exposed: No Fascia: No Tendon: No Tendon: No Muscle: No Muscle: No Joint: No Joint: No Bone: No Bone: No Limited to Skin Breakdown Epithelialization: Large (67-100%) Small  (1-33%) N/A Periwound Skin Texture: Excoriation: No No Abnormalities Noted N/A Induration: No Callus: No Crepitus: No Rash: No Scarring: No Periwound Skin Moisture: Maceration: No No Abnormalities Noted N/A Dry/Scaly: No Periwound Skin Color: Hemosiderin Staining: Yes No Abnormalities Noted N/A Atrophie Blanche: No Cyanosis: No Ecchymosis: No Erythema: No Mottled: No Pallor: No Rubor: No Temperature: No Abnormality N/A N/A Tenderness on Palpation: No No N/A Treatment Notes Electronic Signature(s) Signed: 10/04/2018 4:21:25 PM By: Montey Hora Entered By: Montey Hora on 10/04/2018 14:21:04 Adam Merritt ([id number]) -------------------------------------------------------------------------------- [place] Details Patient Name: Adam Merritt Date of Service: 10/04/2018 1:45 PM Medical Record Number: [id number] Patient Account Number: [id number] Date of Birth/Sex: Apr 12, 1946 (73 y.o. M) Treating RN: Montey Hora Primary Care Yoshi Vicencio: [name] Other Clinician: Referring Thien Berka: [name] Treating Alcide Memoli/Extender: Melburn Hake, [name]: 44 Active Inactive Abuse / Safety / Falls / Self Care Management Nursing Diagnoses: Potential for falls Goals: Patient will remain injury free related to falls Date Initiated: 04/09/2018 Target Resolution Date: 10/05/2018 Goal Status: Active Interventions: Assess fall risk on admission and as needed Notes: Venous Leg Ulcer Nursing Diagnoses: Actual venous Insuffiency (use after diagnosis is confirmed) Goals: Patient will maintain optimal edema control Date Initiated: 04/09/2018 Target Resolution Date: 10/05/2018 Goal Status: Active Interventions: Assess peripheral edema status every visit. Compression as ordered Provide education on venous insufficiency Notes: Electronic Signature(s) Signed: 10/04/2018 4:21:25 PM By: Montey Hora Entered By: Montey Hora on 10/04/2018  14:17:55 Adam Merritt ([id number]) -------------------------------------------------------------------------------- Pain Assessment Details Patient Name: Adam Merritt Date of Service: 10/04/2018 1:45 PM Medical Record Number: [id number] Patient Account Number: [id number] Date of Birth/Sex: 1945-12-13 (73 y.o. M) Treating RN: Harold Barban Primary Care Sarah Zerby: [name] Other Clinician: Referring Yuliana Vandrunen: [name] Treating Zoey Bidwell/Extender: Melburn Hake, [name]: 25 Active Problems Location of Pain Severity and Description of Pain Patient Has Paino No Site Locations Pain Management and Medication Current Pain Management: Electronic Signature(s) Signed: 10/10/2018 8:05:03 AM By: Harold Barban Entered By: Harold Barban on 10/04/2018 13:49:03 Adam Merritt ([id number]) -------------------------------------------------------------------------------- Patient/Caregiver Education Details Patient Name: Adam Merritt Date of Service: 10/04/2018 1:45 PM Medical Record Number: [id number] Patient Account Number: [id number] Date of Birth/Gender: 1945-06-09 (73 y.o. M) Treating RN: Montey Hora Primary Care Physician: [name] Other Clinician: Referring Physician:  [name] Treating Physician/Extender: Worthy Keeler [name]: 25 Education Assessment Education Provided To: Patient and Caregiver Education Topics Provided Wound/Skin Impairment: Handouts: Other: wound care and compression as ordered Methods: Demonstration, Explain/Verbal Responses: State content correctly Electronic Signature(s) Signed: 10/04/2018 4:21:25 PM By: Montey Hora Entered By: Montey Hora on 10/04/2018 14:32:03 Adam Merritt ([id number]) -------------------------------------------------------------------------------- Wound Assessment Details Patient Name: Adam Merritt Date of Service: 10/04/2018 1:45 PM Medical Record Number: [id number] Patient Account  Number: [id number] Date of Birth/Sex: Aug 30, 1945 (73 y.o. M) Treating RN: Montey Hora Primary Care Barbee Mamula: [name] Other Clinician: Referring Glendy Barsanti: [name] Treating Aviv Rota/Extender: Melburn Hake, [name]: 25 Wound Status Wound Number: 19 Primary Diabetic Wound/Ulcer of the Lower Extremity Etiology: Wound Location: Left, Distal, Posterior Lower Leg Wound Healed - Epithelialized Wounding Event: Gradually Appeared Status: Date Acquired: 09/12/2018 Comorbid Anemia, Lymphedema, Chronic Obstructive Weeks Of Treatment: 3 History: Pulmonary Disease (COPD), Sleep Apnea, Clustered Wound: No Congestive Heart Failure, Coronary Artery Disease, Hypertension, Peripheral Venous Disease, Type II Diabetes, Osteoarthritis, Neuropathy, Received Chemotherapy Photos Wound Measurements Length: (cm) 0 % Re Width: (cm) 0 % Re Depth: (cm) 0 Epit Area: (cm) 0 Tun Volume: (cm) 0 Und duction in Area: 100% duction in Volume: 100% helialization: Large (67-100%) neling: No ermining: No Wound Description Classification: Grade 1 Foul Wound Margin: Flat and Intact Slou Exudate Amount: Medium Exudate Type: Serous Exudate Color: amber Odor After Cleansing: No gh/Fibrino No Wound Bed Granulation Amount: Large (67-100%) Exposed Structure Granulation Quality: Pink Fascia Exposed: No Necrotic Amount: None Present (0%) Fat Layer (Subcutaneous Tissue) Exposed: No Tendon Exposed: No Muscle Exposed: No Joint Exposed: No Bone Exposed: No CZAR, YSAGUIRRE ([id number]) Limited to Skin Breakdown Periwound Skin Texture Texture Color No Abnormalities Noted: No No Abnormalities Noted: No Callus: No Atrophie Blanche: No Crepitus: No Cyanosis: No Excoriation: No Ecchymosis: No Induration: No Erythema: No Rash: No Hemosiderin Staining: Yes Scarring: No Mottled: No Pallor: No Moisture Rubor: No No Abnormalities Noted: No Dry / Scaly: No Temperature / Pain Maceration:  No Temperature: No Abnormality Electronic Signature(s) Signed: 10/04/2018 4:21:25 PM By: Montey Hora Entered By: Montey Hora on 10/04/2018 14:21:30 Adam Merritt ([id number]) -------------------------------------------------------------------------------- Wound Assessment Details Patient Name: Adam Merritt Date of Service: 10/04/2018 1:45 PM Medical Record Number: [id number] Patient Account Number: [id number] Date of Birth/Sex: [birth year] (73 y.o. M) Treating RN: Harold Barban Primary Care Takayla Baillie: [name] Other Clinician: Referring Kavion Mancinas: [name] Treating Bartlomiej Jenkinson/Extender: Melburn Hake, [name]: 25 Wound Status Wound Number: 20 Primary Diabetic Wound/Ulcer of the Lower Extremity Etiology: Wound Location: Right Lower Leg - Posterior Wound Open Wounding Event: Gradually Appeared Status: Date Acquired: 10/02/2018 Comorbid Anemia, Lymphedema, Chronic Obstructive Weeks Of Treatment: 0 History: Pulmonary Disease (COPD), Sleep Apnea, Clustered Wound: No Congestive Heart Failure, Coronary Artery Disease, Hypertension, Peripheral Venous Disease, Type II Diabetes, Osteoarthritis, Neuropathy, Received Chemotherapy Photos Wound Measurements Length: (cm) 5 % Reduction in Width: (cm) 4 % Reduction in Depth: (cm) 0.1 Epithelializat Area: (cm) 15.708 Tunneling: Volume: (cm) 1.571 Undermining: Area: Volume: ion: Small (1-33%) No No Wound Description Classification: Grade 2 Foul Odor Afte Wound Margin: Flat and Intact Slough/Fibrino Exudate Amount: Small Exudate Type: Serosanguineous Exudate Color: red, brown r Cleansing: No Yes Wound Bed Granulation Amount: Medium (34-66%) Exposed Structure Granulation Quality: Pink Fascia Exposed: No Necrotic Amount: Small (1-33%) Fat Layer (Subcutaneous Tissue) Exposed: Yes Necrotic Quality: Adherent Slough Tendon Exposed: No Muscle Exposed: No Joint Exposed: No Bone Exposed: No CAMDIN, HEGNER  ([id number])  Periwound Skin Texture Texture Color No Abnormalities Noted: No No Abnormalities Noted: No Moisture No Abnormalities Noted: No Treatment Notes Wound #20 (Right, Posterior Lower Leg) Notes silver cell and bordered foam dressing, secured with velcro wraps Electronic Signature(s) Signed: 10/10/2018 8:05:03 AM By: Harold Barban Entered By: Harold Barban on 10/04/2018 14:01:12 Adam Merritt ([id number]) -------------------------------------------------------------------------------- Vitals Details Patient Name: Adam Merritt Date of Service: 10/04/2018 1:45 PM Medical Record Number: [id number] Patient Account Number: [id number] Date of Birth/Sex: 06/28/1945 (73 y.o. M) Treating RN: Harold Barban Primary Care Kaelan Emami: [name] Other Clinician: Referring Calley Drenning: [name] Treating Isatu Macinnes/Extender: Melburn Hake, [name]: 25 Vital Signs Time Taken: 13:49 Temperature (F): 98.3 Height (in): 66 Pulse (bpm): 76 Weight (lbs): 323 Respiratory Rate (breaths/min): 18 Body Mass Index (BMI): 52.1 Blood Pressure (mmHg): 152/69 Reference Range: 80 - 120 mg / dl Electronic Signature(s) Signed: 10/10/2018 8:05:03 AM By: Harold Barban Entered By: Harold Barban on 10/04/2018 13:50:42

## 2018-10-14 ENCOUNTER — Other Ambulatory Visit: Payer: Self-pay

## 2018-10-14 ENCOUNTER — Encounter: Payer: Self-pay | Admitting: Nurse Practitioner

## 2018-10-14 ENCOUNTER — Ambulatory Visit (INDEPENDENT_AMBULATORY_CARE_PROVIDER_SITE_OTHER): Payer: Medicare Other | Admitting: Nurse Practitioner

## 2018-10-14 VITALS — BP 135/68 | HR 68 | Resp 16 | Ht 66.0 in | Wt 303.4 lb

## 2018-10-14 DIAGNOSIS — I890 Lymphedema, not elsewhere classified: Secondary | ICD-10-CM

## 2018-10-14 DIAGNOSIS — E1142 Type 2 diabetes mellitus with diabetic polyneuropathy: Secondary | ICD-10-CM | POA: Diagnosis not present

## 2018-10-14 DIAGNOSIS — G894 Chronic pain syndrome: Secondary | ICD-10-CM

## 2018-10-14 MED ORDER — FENTANYL 75 MCG/HR TD PT72: 1.0000 | MEDICATED_PATCH | TRANSDERMAL | 0 refills | Status: DC

## 2018-10-14 NOTE — Progress Notes (Signed)
ASIR, BINGLEY ([id number]) Visit Report for 10/10/2018 Chief Complaint Document Details Patient Name: Adam Merritt, Adam Merritt Date of Service: 10/10/2018 3:30 PM Medical Record Number: [id number] Patient Account Number: [id number] Date of Birth/Sex: 1945/08/30 (73 y.o. M) Treating RN: Cornell Barman Primary Care Provider: [name] Other Clinician: Referring Provider: [name] Treating Provider/Extender: Melburn Hake, [name]: 53 Information Obtained from: Patient Chief Complaint he is here for evaluation of bilateral lower extremity lymphedema right right toe and left LE ulcers Electronic Signature(s) Signed: 10/14/2018 9:44:32 AM By: Worthy Keeler PA-C Entered By: Worthy Keeler on 10/10/2018 16:37:06 Adam Merritt ([id number]) -------------------------------------------------------------------------------- HPI Details Patient Name: Adam Merritt Date of Service: 10/10/2018 3:30 PM Medical Record Number: [id number] Patient Account Number: [id number] Date of Birth/Sex: Jan 04, 1946 (73 y.o. M) Treating RN: Cornell Barman Primary Care Provider: [name] Other Clinician: Referring Provider: [name] Treating Provider/Extender: Melburn Hake, [name]: 26 History of Present Illness HPI Description: 10/18/17-He is here for initial evaluation of bilateral lower extremity ulcerations in the presence of venous insufficiency and lymphedema. He has been seen by vascular medicine in the past, Dr. Lucky Cowboy, last seen in 2016. He does have a history of abnormal ABIs, which is to be expected given his lymphedema and venous insufficiency. According to Epic, it appears that all attempts for arterial evaluation and/or angiography were not follow through with by patient. He does have a history of being seen in lymphedema clinic in 2018, stopped going approximately 6 months ago stating "it didn't do any good". He does not have lymphedema pumps, he does not have custom fit compression  wrap/stockings. He is diabetic and his recent A1c last month was 7.6. He admits to chronic bilateral lower extremity pain, no change in pain since blister and ulceration development. He is currently being treated with Levaquin for bronchitis. He has home health and we will continue. 10/25/17-He is here in follow-up evaluation for bilateral lower extremity ulcerationssubtle he remains on Levaquin for bronchitis. Right lower extremity with no evidence of drainage or ulceration, persistent left lower extremity ulceration. He states that home health has not been out since his appointment. He went to [facility] on Tuesday, studies revealed: RIGHT ABI 0.9, TBI 0.6 LEFT ABI 1.1, TBI 0.6 with triphasic flow bilaterally. We will continue his same treatment plan. He has been educated on compression therapy and need for elevation. He will benefit from lymphedema pumps 11/01/17-He is here in follow-up evaluation for left lower extremity ulcer. The right lower extremity remains healed. He has home health services, but they have not been out to see the patient for 2-3 weeks. He states it home health physical therapy changed his dressing yesterday after therapy; he placed Ace wrap compression. We are still waiting for lymphedema pumps, reordered d/t need for company change. 11/08/17-He is here in follow-up evaluation for left lower extremity ulcer. It is improved. Edema is significantly improved with compression therapy. We will continue with same treatment plan and he will follow-up next week. No word regarding lymphedema pumps 11/15/17-He is here in follow-up evaluation for left lower extremity ulcer. He is healed and will be discharged from wound care services. I have reached out to [facility] regarding his lymphedema pumps. They have been unable to reach the patient; the contact number they had with the patient's wife's cell phone and she has not answered any unrecognized calls. Contact  should be made today, trial planned for next week; [facility] will continue to  follow 11/27/17 on evaluation today patient has multiple blistered areas over the right lower extremity his left lower extremity appears to be doing okay. These blistered areas show signs of no infection which is great news. With that being said he did have some necrotic skin overlying which was mechanically debrided away with saline and gauze today without complication. Overall post debridement the wounds appear to be doing better but in general his swelling seems to be increased. This is obviously not good news. I think this is what has given rise to the blisters. 12/04/17 on evaluation today patient presents for follow-up concerning his bilateral lower extremity edema in the right lower extremity ulcers. He has been tolerating the dressing changes without complication. With that being said he has had no real issues with the wraps which is also good news. Overall I'm pleased with the progress he's been making. 12/11/17 on evaluation today patient appears to be doing rather well in regard to his right lateral lower extremity ulcer. He's been tolerating the dressing changes without complication. Fortunately there does not appear to be any evidence of infection at this time. Overall I'm pleased with the progress that is being made. Unfortunately he has been in the hospital due to having what sounds to be a stomach virus/flu fortunately that is starting to get better. 12/18/17 on evaluation today patient actually appears to be doing very well in regard to his bilateral lower extremities the swelling is under fairly good control his lymphedema pumps are still not up and running quite yet. With that being said he does have several areas of opening noted as far as wounds are concerned mainly over the left lower extremity. With that being said I do believe once he gets lymphedema pumps this would least help you mention some the  fluid and preventing this from occurring. Hopefully that will be set up soon sleeves are Artie in place at his home he just waiting for the machine. 12/25/17 on evaluation today patient actually appears to be doing excellent in fact all of his ulcers appear to have resolved his Adam Merritt, Adam Merritt. ([id number]) legs appear very well. I do think he needs compression stockings we have discussed this and they are actually going to go to [facility] today to elastic therapy to get this fitted for him. I think that is definitely a good thing to do. Readmission: 04/09/18 upon evaluation today this patient is seen for readmission due to bilateral lower extremity lymphedema. He has significant swelling of his extremities especially on the left although the right is also swollen he has weeping from both sides. There are no obvious open wounds at this point. Fortunately he has been doing fairly well for quite a bit of time since I last saw him. Nonetheless unfortunately this seems to have reopened and is giving quite a bit of trouble. He states this began about a week ago when he first called [place] to get in to be seen. No fevers, chills, nausea, or vomiting noted at this time. He has not been using his lymphedema pumps due to the fact that they won't fit on his leg at this point likewise is also not been using his compression for essentially the same reason. 04/16/18 upon evaluation today patient actually appears to be doing a little better in regard to the fluid in his bilateral lower extremities. With that being said he's had three falls since I saw him last week. He also states that he's been feeling very poorly. I was  concerned last week and feel like that the concern is still there as far as the congestion in his chest is concerned he seems to be breathing about the same as last week but again he states he's very weak he's not even able to walk further than from the chair to the door. His wife had to buy a  wheelchair just to be able to get them out of the house to get to the appointment today. This has me very concerned. 04/23/18 on evaluation today patient actually appears to be doing much better than last week's evaluation. At that time actually had to transport him to the ER via EMS and he subsequently was admitted for acute pulmonary edema, acute renal failure, and acute congestive heart failure. Fortunately he is doing much better. Apparently they did dialyze him and were able to take off roughly 35 pounds of fluid. Nonetheless he is feeling much better both in regard to his breathing and he's able to get around much better at this time compared to previous. Overall I'm very happy with how things are at this time. There does not appear to be any evidence of infection currently. No fevers, chills, nausea, or vomiting noted at this time. 04/30/2018 patient seen today for follow-up and management of bilateral lower extremity lymphedema. He did express being more sad today than usual due to the recent loss of his dog. He states that he has been compliant with using the lymphedema pumps. However he does admit a minute over the last 2-3 days he has not been using the pumps due to the recent loss of his dog. At this time there is no drainage or open wounds to his lower extremities. The left leg edema is measuring smaller today. Still has a significant amount of edema on bilateral lower extremities With dry flaky skin. He will be referred to the lymphedema clinic for further management. Will continue 3 layer compression wraps and follow-up in 1-2 weeks.Denies any pain, fever, chairs recently. No recent falls or injuries reported during this visit. 05/07/18 on evaluation today patient actually appears to be doing very well in regard to his lower extremities in general all things considered. With that being said he is having some pain in the legs just due to the amount of swelling. He does have an area  where he had a blister on the left lateral lower extremity this is open at this point other than that there's nothing else weeping at this time. 05/14/18 on evaluation today patient actually appears to be doing excellent all things considered in regard to his lower extremities. He still has a couple areas of weeping on each leg which has continued to be the issue for him. He does have an appointment with the lymphedema clinic although this isn't until February 2020. That was the earliest they had. In the meantime he has continued to tolerate the compression wraps without complication. 05/28/18 on evaluation today patient actually appears to be doing more poorly in regard to his left lower extremity where he has a wound open at this point. He also had a fall where he subsequently injured his right great toe which has led to an open wound at the site unfortunately. He has been tolerating the dressing changes without complication in general as far as the wraps are concerned that he has not been putting any dressing on the left 1st toe ulcer site. 06/11/18 on evaluation today patient appears to be doing much worse in regard to his  bilateral lower extremity ulcers. He has been tolerating the dressing changes without complication although his legs have not been wrapped more recently. Overall I am not very pleased with the way his legs appear. I do believe he needs to be back in compression wraps he still has not received his compression wraps from the [facility] hospital as of yet. 06/18/18 on evaluation today patient actually appears to be doing significantly better than last time I saw him. He has been tolerating the compression wraps without complication in the circumferential ulcers especially appear to be doing much better. His toe ulcer on the right in regard to the great toe is better although not as good as the legs in my pinion. No fevers chills noted AMJAD, FIKES ([id number]) 07/02/18 on evaluation  today patient appears to be doing much better in regard to his lower extremity ulcers. Unfortunately since I last saw him he's had the distal portion of his right great toe if he dated it sounds as if this actually went downhill very quickly. I had only seen him a few days prior and the toe did not appear to be infected at that point subsequently became infected very rapidly and it was decided by the surgeon that the distal portion of the toe needed to be removed. The patient seems to be doing well in this regard he tells me. With that being said his lower extremities are doing better from the standpoint of the wounds although he is significantly swollen at this point. 07/09/18 on evaluation today patient appears to be doing better in regard to the wounds on his lower extremities. In fact everything is almost completely healed he is just a small area on the left posterior lower extremity that is open at this point. 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 With that being said he needs some kind of compression it does sound like he may not be wearing his compression, that is the wraps, during the entire time between when he's here visit to visit. Apparently his wife took the current one off because it began to "fall apart". 07/16/18 on evaluation today patient appears to be extremely swollen especially in regard to his left lower extremity unfortunately. He also has a new skin tear over the left lower extremity and there's a smaller area on the right lower extremity as well. Unfortunately this seems to be due in part to blistering and fluid buildup in his leg. He did get the reduction wraps that were ordered by the [facility] for him to go to lymphedema clinic. With that being said his wounds on the legs have not healed  to the point to where they would likely accept them as a patient lymphedema clinic currently. We need to try to get this to heal. With that being said he's been taking his wraps off which is not doing him any favors at this point. In fact this is probably quite counterproductive compared to what needs to occur. We will likely need to increase to a four layer compression wrap and continue to also utilize elevation and he has to keep the wraps on not take them off as he's been doing currently hasn't had a wrap on since Saturday. 07/23/18 on evaluation today patient appears to be doing much better in regard to his bilateral lower extremities.  In fact his left lower extremity which was the largest is actually 15 cm smaller today compared to what it was last time he was here in our clinic. This is obviously good news after just one week. Nonetheless the differences he actually kept the wraps on during the entire week this time. That's not typical for him. I do believe he understands a little bit better now the severity of the situation and why it's important for him to keep these wraps on. 07/30/18 on evaluation today patient actually appears to be doing rather well in regard to his lower extremities. His legs are much smaller than they have been in the past and he actually has only one very small rudder superficial region remaining that is not closed on the left lateral/posterior lower extremity even this is almost completely close. I do believe likely next week he will be healed without any complications. I do think we need to continue the wraps however this seems to be beneficial for him. I also think it may be a good time for [place] to go ahead and see about getting the appointment with the lymphedema clinic which is supposed to be made for him in order to keep this moving along and hopefully get them into compression wraps that will in the end help him to remain healed. 08/06/18 on evaluation today patient  actually appears to be doing very well in regard as bilateral lower extremities. In fact his wounds appear to be completely healed at this time. 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 He is in the process of getting appointment with the lymphedema clinic we have made this referral were just waiting to hear back on the schedule time. We need to follow up on that today as well. 08/13/18 on evaluation today patient actually appears to be doing very well in regard to his bilateral lower extremities there are no open wounds at this point. We are gonna go ahead and see about ordering the Velcro compression wraps for him had a discussion with them about [place] doing it versus the [place] they feel like they can definitely afford going ahead and get the wraps themselves and they would prefer to try to avoid having to go to the lymphedema clinic if it all possible which I completely understand. As long as he has good compression I'm okay either way. 3/17//20 on evaluation today patient actually appears to be doing well in regard to his bilateral lower extremity ulcers. He has been tolerating the dressing changes without complication specifically the compression wraps. Overall is had no issues my fingers finding that I see at this point is that he is having trouble with constipation. He tells me he has not been able to go to the bathroom for about six days. He's taken to over-the-counter oral laxatives unfortunately this is not helping. He has not contacted his doctor. 08/27/18 on evaluation today patient appears to be doing fairly well in regard to his lower extremities at this point. There does not appear to be any new altars is swelling is very well controlled. We are still waiting for his Velcro compression wraps to arrive that should be sometime in the next week is Artie sent the check for these. 09/03/18 on evaluation today patient appears to be doing  excellent in regard to his bilateral lower extremities which he shows Adam Merritt, Adam Merritt. ([id number]) no signs of wound openings in regard to this point. He does have  his Velcro compression wraps which did arrive in the mail since I saw him last week. Overall he is doing excellent in my pinion. 09/13/18 on evaluation today patient appears to be doing very well currently in regard to the overall appearance of his bilateral lower extremities although he's a little bit more swollen than last time we saw him. At that point he been discharged without any open wounds. Nonetheless he has a small open wound on the posterior left lower extremity with some evidence of cellulitis noted as well. Fortunately I feel like he has made good progress overall with regard to his lower extremities from were things used to be. 09/20/18 on evaluation today patient actually appears to be doing much better. The erythematous lower extremity is improving wound itself which is still open appears to be doing much better as far as but appearance as well as pain is concerned overall very pleased in this regard. There's no signs of active infection at this time. 09/27/18 on evaluation today patient's wounds on the lower extremity actually appear to be doing fairly well at this time which is good news. There is no evidence of active infection currently and again is just as left lower extremity were there any wounds at this point anyway. I believe they may be completely healed but again I'm not 100% sure based on evaluation today. I think one more week of observation would likely be a good idea. 10/04/18 on evaluation today patient actually appears to be doing excellent in regard to the left lower extremity which actually appears to be completely healed as of today. Unfortunately he's been having issues with his right lower extremity have a new wound that has opened. Fortunately there's no evidence of active infection at this time which is  good news. 10/10/18 upon evaluation today patient actually appears to be doing a little bit worse with the new open area on his right posterior lower extremity. He's been tolerating the dressing changes without complication. Right now we been using his compression wraps although I think we may need to switch back to actually performing bilateral compression wraps are in the clinic. No fevers, chills, nausea, or vomiting noted at this time. Electronic Signature(s) Signed: 10/14/2018 9:44:32 AM By: Worthy Keeler PA-C Entered By: Worthy Keeler on 10/14/2018 09:39:48 Adam Merritt ([id number]) -------------------------------------------------------------------------------- Physical Exam Details Patient Name: Adam Merritt Date of Service: 10/10/2018 3:30 PM Medical Record Number: [id number] Patient Account Number: [id number] Date of Birth/Sex: 1945-12-07 (73 y.o. M) Treating RN: Cornell Barman Primary Care Provider: [name] Other Clinician: Referring Provider: [name] Treating Provider/Extender: Melburn Hake, [name]: 51 Constitutional Well-nourished and well-hydrated in no acute distress. Respiratory normal breathing without difficulty. clear to auscultation bilaterally. Cardiovascular regular rate and rhythm with normal S1, S2. Psychiatric this patient is able to make decisions and demonstrates good insight into disease process. Alert and Oriented x 3. pleasant and cooperative. Notes Patient's wound bed currently did not require any sharp debridement at this time. He actually appears to be doing better in general in regard to the lower extremity that has been present with a wound for some time now on the left. Unfortunately the right has reopened. I'm concerned about this currently in that I don't think he is been wearing his compression stockings like he's supposed to that's why he has his reopening's and worsens. Electronic Signature(s) Signed: 10/14/2018 9:44:32 AM  By: Worthy Keeler PA-C Entered By: Worthy Keeler on 10/14/2018 09:40:35  Adam Merritt, Adam Merritt ([id number]) -------------------------------------------------------------------------------- Physician Orders Details Patient Name: Adam Merritt, Adam Merritt Date of Service: 10/10/2018 3:30 PM Medical Record Number: [id number] Patient Account Number: [id number] Date of Birth/Sex: [birth year] (73 y.o. 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 E66.9 Obesity, unspecified Wound Cleansing Wound #19R Left,Distal,Posterior Lower Leg o Cleanse wound with mild soap and water Wound #20 Right,Posterior Lower Leg o Cleanse wound with mild soap and water Primary Wound Dressing Wound #19R Left,Distal,Posterior Lower Leg o Silver Alginate Wound #20 Right,Posterior Lower Leg o Silver Alginate Secondary Dressing Wound #19R Left,Distal,Posterior Lower Leg o Dry Gauze Wound #20 Right,Posterior Lower Leg o Dry Gauze Dressing Change Frequency Wound #19R Left,Distal,Posterior Lower Leg o Change dressing every week Wound #20 Right,Posterior Lower Leg o Change dressing every week Follow-up Appointments Wound #19R Left,Distal,Posterior Lower Leg o Return Appointment in 1 week. o Nurse Visit as needed Wound #20 Right,Posterior Lower Leg Adam Merritt, Adam Merritt ([id number]) o Return Appointment in 1 week. o Nurse Visit as needed Edema Control Wound #19R Left,Distal,Posterior Lower Leg o 3 Layer  Compression System - Bilateral o Elevate legs to the level of the heart and pump ankles as often as possible o Compression Pump: Use compression pump on left lower extremity for 30 minutes, twice daily. o Compression Pump: Use compression pump on right lower extremity for 30 minutes, twice daily. Wound #20 Right,Posterior Lower Leg o 3 Layer Compression System - Bilateral o Elevate legs to the level of the heart and pump ankles as often as possible o Compression Pump: Use compression pump on left lower extremity for 30 minutes, twice daily. o Compression Pump: Use compression pump on right lower extremity for 30 minutes, twice daily. Electronic Signature(s) Signed: 10/10/2018 4:39:58 PM By: Gretta Cool, BSN, RN, CWS, Kim RN, BSN Signed: 10/14/2018 9:44:32 AM By: Worthy Keeler PA-C Entered By: Gretta Cool BSN, RN, CWS, Kim on 10/10/2018 16:39:56 Adam Merritt ([id number]) -------------------------------------------------------------------------------- Problem List Details Patient Name: Adam Merritt, Adam Merritt Date of Service: 10/10/2018 3:30 PM Medical Record Number: [id number] Patient Account Number: [id number] Date of Birth/Sex: 1945/12/25 (73 y.o. M) Treating RN: Cornell Barman Primary Care Provider: [name] Other Clinician: Referring Provider: [name] Treating Provider/Extender: Melburn Hake, [name]: 43 Active Problems ICD-10 Evaluated Encounter Code Description Active Date Today Diagnosis I89.0 Lymphedema, not elsewhere classified 04/09/2018 No Yes L97.512 Non-pressure chronic ulcer of other part of right foot with fat 05/28/2018 No Yes layer exposed L97.822 Non-pressure chronic ulcer of other part of left lower leg with 05/28/2018 No Yes fat layer exposed L98.492 Non-pressure chronic ulcer of skin of other sites with fat layer 06/11/2018 No Yes exposed E66.9 Obesity, unspecified 04/09/2018 No Yes Inactive Problems Resolved Problems ICD-10 Code Description Active  Date Resolved Date L03.115 Cellulitis of right lower limb 04/09/2018 04/09/2018 L03.116 Cellulitis of left lower limb 04/09/2018 04/09/2018 Electronic Signature(s) Signed: 10/14/2018 9:44:32 AM By: Worthy Keeler PA-C Entered By: Worthy Keeler on 10/10/2018 16:37:00 Adam Merritt ([id number]) Adam Merritt, Adam Merritt ([id number]) -------------------------------------------------------------------------------- Progress Note Details Patient Name: Adam Merritt Date of Service: 10/10/2018 3:30  PM Medical Record Number: [id number] Patient Account Number: [id number] Date of Birth/Sex: 05-Feb-1946 (73 y.o. M) Treating RN: Cornell Barman Primary Care Provider: [name] Other Clinician: Referring Provider: [name] Treating Provider/Extender: Melburn Hake, [name]: 26 Subjective Chief Complaint Information obtained from Patient he is here for evaluation of bilateral lower extremity lymphedema right right toe and left LE ulcers History of Present Illness (HPI) 10/18/17-He is here for initial evaluation of bilateral lower extremity ulcerations in the presence of venous insufficiency and lymphedema. He has been seen by vascular medicine in the past, Dr. Lucky Cowboy, last seen in 2016. He does have a history of abnormal ABIs, which is to be expected given his lymphedema and venous insufficiency. According to Epic, it appears that all attempts for arterial evaluation and/or angiography were not follow through with by patient. He does have a history of being seen in lymphedema clinic in 2018, stopped going approximately 6 months ago stating "it didn't do any good". He does not have lymphedema pumps, he does not have custom fit compression wrap/stockings. He is diabetic and his recent A1c last month was 7.6. He admits to chronic bilateral lower extremity pain, no change in pain since blister and ulceration development. He is currently being treated with Levaquin for bronchitis. He has home health and we will  continue. 10/25/17-He is here in follow-up evaluation for bilateral lower extremity ulcerationssubtle he remains on Levaquin for bronchitis. Right lower extremity with no evidence of drainage or ulceration, persistent left lower extremity ulceration. He states that home health has not been out since his appointment. [facility], studies revealed: RIGHT ABI 0.9, TBI 0.6 LEFT ABI 1.1, TBI 0.6 with triphasic flow bilaterally. We will continue his same treatment plan. He has been educated on compression therapy and need for elevation. He will benefit from lymphedema pumps 11/01/17-He is here in follow-up evaluation for left lower extremity ulcer. The right lower extremity remains healed. He has home health services, but they have not been out to see the patient for 2-3 weeks. He states it home health physical therapy changed his dressing yesterday after therapy; he placed Ace wrap compression. We are still waiting for lymphedema pumps, reordered d/t need for company change. 11/08/17-He is here in follow-up evaluation for left lower extremity ulcer. It is improved. Edema is significantly improved with compression therapy. We will continue with same treatment plan and he will follow-up next week. No word regarding lymphedema pumps 11/15/17-He is here in follow-up evaluation for left lower extremity ulcer. He is healed and will be discharged from wound care services. I have reached out to [facility] regarding his lymphedema pumps. They have been unable to reach the patient; the contact number they had with the patient's wife's cell phone and she has not answered any unrecognized calls. Contact should be made today, trial planned for next week; [facility] will continue to follow 11/27/17 on evaluation today patient has multiple blistered areas over the right lower extremity his left lower extremity appears to be doing okay. These blistered areas show signs of no  infection which is great news. With that being said he did have some necrotic skin overlying which was mechanically debrided away with saline and gauze today without complication. Overall post debridement the wounds appear to be doing better but in general his swelling seems to be increased. This is obviously not good news. I think this is what has given rise to the blisters. 12/04/17 on evaluation  today patient presents for follow-up concerning his bilateral lower extremity edema in the right lower extremity ulcers. He has been tolerating the dressing changes without complication. With that being said he has had no real issues with the wraps which is also good news. Overall I'm pleased with the progress he's been making. 12/11/17 on evaluation today patient appears to be doing rather well in regard to his right lateral lower extremity ulcer. He's been tolerating the dressing changes without complication. Fortunately there does not appear to be any evidence of infection at this time. Overall I'm pleased with the progress that is being made. Unfortunately he has been in the hospital due to having what sounds to be a stomach virus/flu fortunately that is starting to get better. 12/18/17 on evaluation today patient actually appears to be doing very well in regard to his bilateral lower extremities the Adam Merritt, Adam Merritt. ([id number]) swelling is under fairly good control his lymphedema pumps are still not up and running quite yet. With that being said he does have several areas of opening noted as far as wounds are concerned mainly over the left lower extremity. With that being said I do believe once he gets lymphedema pumps this would least help you mention some the fluid and preventing this from occurring. Hopefully that will be set up soon sleeves are Artie in place at his home he just waiting for the machine. 12/25/17 on evaluation today patient actually appears to be doing excellent in fact all of his ulcers  appear to have resolved his legs appear very well. I do think he needs compression stockings we have discussed this and they are actually going to go to [place] today to elastic therapy to get this fitted for him. I think that is definitely a good thing to do. Readmission: 04/09/18 upon evaluation today this patient is seen for readmission due to bilateral lower extremity lymphedema. He has significant swelling of his extremities especially on the left although the right is also swollen he has weeping from both sides. There are no obvious open wounds at this point. Fortunately he has been doing fairly well for quite a bit of time since I last saw him. Nonetheless unfortunately this seems to have reopened and is giving quite a bit of trouble. He states this began about a week ago when he first called [place] to get in to be seen. No fevers, chills, nausea, or vomiting noted at this time. He has not been using his lymphedema pumps due to the fact that they won't fit on his leg at this point likewise is also not been using his compression for essentially the same reason. 04/16/18 upon evaluation today patient actually appears to be doing a little better in regard to the fluid in his bilateral lower extremities. With that being said he's had three falls since I saw him last week. He also states that he's been feeling very poorly. I was concerned last week and feel like that the concern is still there as far as the congestion in his chest is concerned he seems to be breathing about the same as last week but again he states he's very weak he's not even able to walk further than from the chair to the door. His wife had to buy a wheelchair just to be able to get them out of the house to get to the appointment today. This has me very concerned. 04/23/18 on evaluation today patient actually appears to be doing much better than last week's  evaluation. At that time actually had to transport him to the ER via EMS  and he subsequently was admitted for acute pulmonary edema, acute renal failure, and acute congestive heart failure. Fortunately he is doing much better. Apparently they did dialyze him and were able to take off roughly 35 pounds of fluid. Nonetheless he is feeling much better both in regard to his breathing and he's able to get around much better at this time compared to previous. Overall I'm very happy with how things are at this time. There does not appear to be any evidence of infection currently. No fevers, chills, nausea, or vomiting noted at this time. 04/30/2018 patient seen today for follow-up and management of bilateral lower extremity lymphedema. He did express being more sad today than usual due to the recent loss of his dog. He states that he has been compliant with using the lymphedema pumps. However he does admit a minute over the last 2-3 days he has not been using the pumps due to the recent loss of his dog. At this time there is no drainage or open wounds to his lower extremities. The left leg edema is measuring smaller today. Still has a significant amount of edema on bilateral lower extremities With dry flaky skin. He will be referred to the lymphedema clinic for further management. Will continue 3 layer compression wraps and follow-up in 1-2 weeks.Denies any pain, fever, chairs recently. No recent falls or injuries reported during this visit. 05/07/18 on evaluation today patient actually appears to be doing very well in regard to his lower extremities in general all things considered. With that being said he is having some pain in the legs just due to the amount of swelling. He does have an area where he had a blister on the left lateral lower extremity this is open at this point other than that there's nothing else weeping at this time. 05/14/18 on evaluation today patient actually appears to be doing excellent all things considered in regard to his lower extremities. He still  has a couple areas of weeping on each leg which has continued to be the issue for him. He does have an appointment with the lymphedema clinic although this isn't until February 2020. That was the earliest they had. In the meantime he has continued to tolerate the compression wraps without complication. 05/28/18 on evaluation today patient actually appears to be doing more poorly in regard to his left lower extremity where he has a wound open at this point. He also had a fall where he subsequently injured his right great toe which has led to an open wound at the site unfortunately. He has been tolerating the dressing changes without complication in general as far as the wraps are concerned that he has not been putting any dressing on the left 1st toe ulcer site. 06/11/18 on evaluation today patient appears to be doing much worse in regard to his bilateral lower extremity ulcers. He has been tolerating the dressing changes without complication although his legs have not been wrapped more recently. Overall I am not very pleased with the way his legs appear. I do believe he needs to be back in compression wraps he still has not received his compression wraps from the [facility] hospital as of yet. Adam Merritt, Adam Merritt ([id number]) 06/18/18 on evaluation today patient actually appears to be doing significantly better than last time I saw him. He has been tolerating the compression wraps without complication in the circumferential ulcers especially appear to  be doing much better. His toe ulcer on the right in regard to the great toe is better although not as good as the legs in my pinion. No fevers chills noted 07/02/18 on evaluation today patient appears to be doing much better in regard to his lower extremity ulcers. Unfortunately since I last saw him he's had the distal portion of his right great toe if he dated it sounds as if this actually went downhill very quickly. I had only seen him a few days prior and the toe  did not appear to be infected at that point subsequently became infected very rapidly and it was decided by the surgeon that the distal portion of the toe needed to be removed. The patient seems to be doing well in this regard he tells me. With that being said his lower extremities are doing better from the standpoint of the wounds although he is significantly swollen at this point. 07/09/18 on evaluation today patient appears to be doing better in regard to the wounds on his lower extremities. In fact everything is almost completely healed he is just a small area on the left posterior lower extremity that is open at this point. 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 With that being said he needs some kind of compression it does sound like he may not be wearing his compression, that is the wraps, during the entire time between when he's here visit to visit. Apparently his wife took the current one off because it began to "fall apart". 07/16/18 on evaluation today patient appears to be extremely swollen especially in regard to his left lower extremity unfortunately. He also has a new skin tear over the left lower extremity and there's a smaller area on the right lower extremity as well. Unfortunately this seems to be due in part to blistering and fluid buildup in his leg. He did get the reduction wraps that were ordered by the [facility] hospital for him to go to lymphedema clinic. With that being said his wounds on the legs have not healed to the point to where they would likely accept them as a patient lymphedema clinic currently. We need to try to get this to heal. With that being said he's been taking his wraps off which is not doing him any favors at this point. In fact this is probably quite counterproductive compared to  what needs to occur. We will likely need to increase to a four layer compression wrap and continue to also utilize elevation and he has to keep the wraps on not take them off as he's been doing currently hasn't had a wrap on since Saturday. 07/23/18 on evaluation today patient appears to be doing much better in regard to his bilateral lower extremities. In fact his left lower extremity which was the largest is actually 15 cm smaller today compared to what it was last time he was here in our clinic. This is obviously good news after just one week. Nonetheless the differences he actually kept the wraps on during the entire week this time. That's not typical for him. I do believe he understands a little bit better now the severity of the situation and why it's important for him to keep these wraps on. 07/30/18 on evaluation today patient actually appears to be doing rather well in regard  to his lower extremities. His legs are much smaller than they have been in the past and he actually has only one very small rudder superficial region remaining that is not closed on the left lateral/posterior lower extremity even this is almost completely close. I do believe likely next week he will be healed without any complications. I do think we need to continue the wraps however this seems to be beneficial for him. I also think it may be a good time for [place] to go ahead and see about getting the appointment with the lymphedema clinic which is supposed to be made for him in order to keep this moving along and hopefully get them into compression wraps that will in the end help him to remain healed. 08/06/18 on evaluation today patient actually appears to be doing very well in regard as bilateral lower extremities. In fact his wounds appear to be completely healed at this time. 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 He is in the process of getting appointment  with the lymphedema clinic we have made this referral were just waiting to hear back on the schedule time. We need to follow up on that today as well. 08/13/18 on evaluation today patient actually appears to be doing very well in regard to his bilateral lower extremities there are no open wounds at this point. We are gonna go ahead and see about ordering the Velcro compression wraps for him had a discussion with them about [place] doing it versus the [place] they feel like they can definitely afford going ahead and get the wraps themselves and they would prefer to try to avoid having to go to the lymphedema clinic if it all possible which I completely understand. As long as he has good compression I'm okay either way. 3/17//20 on evaluation today patient actually appears to be doing well in regard to his bilateral lower extremity ulcers. He has been tolerating the dressing changes without complication specifically the compression wraps. Overall is had no issues my fingers finding that I see at this point is that he is having trouble with constipation. He tells me he has not been able to go to the bathroom for about six days. He's taken to over-the-counter oral laxatives unfortunately this is not helping. He has not contacted his doctor. Adam Merritt, Adam Merritt ([id number]) 08/27/18 on evaluation today patient appears to be doing fairly well in regard to his lower extremities at this point. There does not appear to be any new altars is swelling is very well controlled. We are still waiting for his Velcro compression wraps to arrive that should be sometime in the next week is Artie sent the check for these. 09/03/18 on evaluation today patient appears to be doing excellent in regard to his bilateral lower extremities which he shows no signs of wound openings in regard to this point. He does have his Velcro compression wraps which did arrive in the mail since I saw him last week. Overall he is doing excellent in my  pinion. 09/13/18 on evaluation today patient appears to be doing very well currently in regard to the overall appearance of his bilateral lower extremities although he's a little bit more swollen than last time we saw him. At that point he been discharged without any open wounds. Nonetheless he has a small open wound on the posterior left lower extremity with some evidence of cellulitis noted as well. Fortunately I feel like he has made  good progress overall with regard to his lower extremities from were things used to be. 09/20/18 on evaluation today patient actually appears to be doing much better. The erythematous lower extremity is improving wound itself which is still open appears to be doing much better as far as but appearance as well as pain is concerned overall very pleased in this regard. There's no signs of active infection at this time. 09/27/18 on evaluation today patient's wounds on the lower extremity actually appear to be doing fairly well at this time which is good news. There is no evidence of active infection currently and again is just as left lower extremity were there any wounds at this point anyway. I believe they may be completely healed but again I'm not 100% sure based on evaluation today. I think one more week of observation would likely be a good idea. 10/04/18 on evaluation today patient actually appears to be doing excellent in regard to the left lower extremity which actually appears to be completely healed as of today. Unfortunately he's been having issues with his right lower extremity have a new wound that has opened. Fortunately there's no evidence of active infection at this time which is good news. 10/10/18 upon evaluation today patient actually appears to be doing a little bit worse with the new open area on his right posterior lower extremity. He's been tolerating the dressing changes without complication. Right now we been using his compression wraps although I  think we may need to switch back to actually performing bilateral compression wraps are in the clinic. No fevers, chills, nausea, or vomiting noted at this time. Patient History Information obtained from Patient. Family History Cancer - Paternal Grandparents, Kidney Disease - Siblings, Lung Disease - Mother, No family history of Diabetes, Heart Disease, Hereditary Spherocytosis, Hypertension, Seizures, Stroke, Thyroid Problems, Tuberculosis. Social History Never smoker, Marital Status - Married, Alcohol Use - Never, Drug Use - No History, Caffeine Use - Never. Medical History Eyes Denies history of Cataracts, Glaucoma, Optic Neuritis Ear/Nose/Mouth/Throat Denies history of Chronic sinus problems/congestion, Middle ear problems Hematologic/Lymphatic Patient has history of Anemia - aplastic anemia, Lymphedema Denies history of Hemophilia, Human Immunodeficiency Virus, Sickle Cell Disease Respiratory Patient has history of Chronic Obstructive Pulmonary Disease (COPD), Sleep Apnea Denies history of Aspiration, Asthma, Pneumothorax, Tuberculosis Cardiovascular Patient has history of Congestive Heart Failure, Coronary Artery Disease, Hypertension, Peripheral Venous Disease Denies history of Angina, Arrhythmia, Deep Vein Thrombosis, Hypotension, Myocardial Infarction, Peripheral Arterial Disease, Phlebitis, Vasculitis WILBERTO, CONSOLE ([id number]) Gastrointestinal Denies history of Cirrhosis , Colitis, Crohn s, Hepatitis A, Hepatitis B, Hepatitis C Endocrine Patient has history of Type II Diabetes Denies history of Type I Diabetes Genitourinary Denies history of End Stage Renal Disease Immunological Denies history of Lupus Erythematosus, Raynaud s, Scleroderma Integumentary (Skin) Denies history of History of Burn, History of pressure wounds Musculoskeletal Patient has history of Osteoarthritis Denies history of Gout, Rheumatoid Arthritis, Osteomyelitis Neurologic Patient has history  of Neuropathy Denies history of Dementia, Quadriplegia, Paraplegia, Seizure Disorder Oncologic Patient has history of Received Chemotherapy - last dose 2006 Denies history of Received Radiation Psychiatric Denies history of Anorexia/bulimia, Confinement Anxiety Hospitalization/Surgery History - bronchitis. Medical And Surgical History Notes Respiratory home O2 at night as needed Cardiovascular varicose veins Neurologic CVA - 1992 Review of Systems (ROS) Constitutional Symptoms (General Health) Denies complaints or symptoms of Fatigue, Fever, Chills, Marked Weight Change. Respiratory Denies complaints or symptoms of Chronic or frequent coughs, Shortness of Breath. Cardiovascular Complains or has symptoms of LE  edema. Denies complaints or symptoms of Chest pain. Psychiatric Denies complaints or symptoms of Anxiety, Claustrophobia. Objective Constitutional Well-nourished and well-hydrated in no acute distress. Vitals Time Taken: 3:40 PM, Height: 66 in, Weight: 323 lbs, BMI: 52.1, Temperature: 97.6 F, Pulse: 70 bpm, Respiratory Rate: 18 breaths/min, Blood Pressure: 153/67 mmHg. Adam Merritt, MICCICHE ([id number]) Respiratory normal breathing without difficulty. clear to auscultation bilaterally. Cardiovascular regular rate and rhythm with normal S1, S2. Psychiatric this patient is able to make decisions and demonstrates good insight into disease process. Alert and Oriented x 3. pleasant and cooperative. General Notes: Patient's wound bed currently did not require any sharp debridement at this time. He actually appears to be doing better in general in regard to the lower extremity that has been present with a wound for some time now on the left. Unfortunately the right has reopened. I'm concerned about this currently in that I don't think he is been wearing his compression stockings like he's supposed to that's why he has his reopening's and worsens. Integumentary (Hair, Skin) Wound  #19R status is Open. Original cause of wound was Gradually Appeared. The wound is located on the Left,Distal,Posterior Lower Leg. The wound measures 1.2cm length x 3cm width x 0.1cm depth; 2.827cm^2 area and 0.283cm^3 volume. The wound is limited to skin breakdown. There is no tunneling or undermining noted. There is a medium amount of serous drainage noted. The wound margin is flat and intact. There is large (67-100%) pink granulation within the wound bed. There is no necrotic tissue within the wound bed. The periwound skin appearance exhibited: Maceration, Hemosiderin Staining. The periwound skin appearance did not exhibit: Callus, Crepitus, Excoriation, Induration, Rash, Scarring, Dry/Scaly, Atrophie Blanche, Cyanosis, Ecchymosis, Mottled, Pallor, Rubor, Erythema. Periwound temperature was noted as No Abnormality. Wound #20 status is Open. Original cause of wound was Gradually Appeared. The wound is located on the Right,Posterior Lower Leg. The wound measures 3cm length x 4cm width x 0.1cm depth; 9.425cm^2 area and 0.942cm^3 volume. There is Fat Layer (Subcutaneous Tissue) Exposed exposed. There is no tunneling or undermining noted. There is a large amount of serous drainage noted. The wound margin is flat and intact. There is medium (34-66%) pink granulation within the wound bed. There is a small (1-33%) amount of necrotic tissue within the wound bed including Adherent Slough. The periwound skin appearance exhibited: Maceration. 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  ([id number]) Left,Distal,Posterior Lower Leg . There was a Three Layer Compression Therapy Procedure with a pre-treatment ABI of 1.1 by Cornell Barman, RN. Post procedure Diagnosis Wound #19R: Same as Pre-Procedure Wound #20 Pre-procedure diagnosis of Wound #20 is a Diabetic Wound/Ulcer of the Lower Extremity located on the Right,Posterior Lower Leg . There was a Three Layer Compression Therapy Procedure with a pre-treatment ABI of 1.1 by Cornell Barman, RN. Post procedure Diagnosis Wound #20: Same as Pre-Procedure Plan Wound Cleansing: Wound #19R Left,Distal,Posterior Lower Leg: Cleanse wound with mild soap and water Wound #20 Right,Posterior Lower Leg: Cleanse wound with mild soap and water Primary Wound Dressing: Wound #19R Left,Distal,Posterior Lower Leg: Silver Alginate Wound #20 Right,Posterior Lower Leg: Silver Alginate Secondary Dressing: Wound #  19R Left,Distal,Posterior Lower Leg: Dry Gauze Wound #20 Right,Posterior Lower Leg: Dry Gauze Dressing Change Frequency: Wound #19R Left,Distal,Posterior Lower Leg: Change dressing every week Wound #20 Right,Posterior Lower Leg: Change dressing every week Follow-up Appointments: Wound #19R Left,Distal,Posterior Lower Leg: Return Appointment in 1 week. Nurse Visit as needed Wound #20 Right,Posterior Lower Leg: Return Appointment in 1 week. Nurse Visit as needed Edema Control: Wound #19R Left,Distal,Posterior Lower Leg: 3 Layer Compression System - Bilateral Elevate legs to the level of the heart and pump ankles as often as possible Compression Pump: Use compression pump on left lower extremity for 30 minutes, twice daily. Compression Pump: Use compression pump on right lower extremity for 30 minutes, twice daily. Wound #20 Right,Posterior Lower Leg: 3 Layer Compression System - Bilateral Elevate legs to the level of the heart and pump ankles as often as possible Compression Pump: Use compression pump on left lower extremity for 30  minutes, twice daily. Compression Pump: Use compression pump on right lower extremity for 30 minutes, twice daily. LYRIC, HOAR ([id number]) My suggestion currently is gonna be that we go ahead and initiate the above wound care measures for the next week. Patient is in agreement with plan. We will subsequently see were things stand at follow-up. If anything changes or worsens in the meantime he will contact the office and let me know. Please see above for specific wound care orders. We will see patient for re-evaluation in 1 week(s) here in the clinic. If anything worsens or changes patient will contact our office for additional recommendations. Electronic Signature(s) Signed: 10/14/2018 9:44:32 AM By: Worthy Keeler PA-C Entered By: Worthy Keeler on 10/14/2018 09:40:52 Adam Merritt ([id number]) -------------------------------------------------------------------------------- ROS/PFSH Details Patient Name: Adam Merritt Date of Service: 10/10/2018 3:30 PM Medical Record Number: [id number] Patient Account Number: [id number] Date of Birth/Sex: February 14, 1946 (73 y.o. M) Treating RN: Cornell Barman Primary Care Provider: [name] Other Clinician: Referring Provider: [name] Treating Provider/Extender: Melburn Hake, [name]: 15 Information Obtained From Patient Constitutional Symptoms (General Health) Complaints and Symptoms: Negative for: Fatigue; Fever; Chills; Marked Weight Change Respiratory Complaints and Symptoms: Negative for: Chronic or frequent coughs; Shortness of Breath Medical History: Positive for: Chronic Obstructive Pulmonary Disease (COPD); Sleep Apnea Negative for: Aspiration; Asthma; Pneumothorax; Tuberculosis Past Medical History Notes: home O2 at night as needed Cardiovascular Complaints and Symptoms: Positive for: LE edema Negative for: Chest pain Medical History: Positive for: Congestive Heart Failure; Coronary Artery Disease; Hypertension;  Peripheral Venous Disease Negative for: Angina; Arrhythmia; Deep Vein Thrombosis; Hypotension; Myocardial Infarction; Peripheral Arterial Disease; Phlebitis; Vasculitis Past Medical History Notes: varicose veins Psychiatric Complaints and Symptoms: Negative for: Anxiety; Claustrophobia Medical History: Negative for: Anorexia/bulimia; Confinement Anxiety Eyes Medical History: Negative for: Cataracts; Glaucoma; Optic Neuritis Ear/Nose/Mouth/Throat Medical History: Negative for: Chronic sinus problems/congestion; Middle ear problems JACARIUS, HANDEL ([id number]) Hematologic/Lymphatic Medical History: Positive for: Anemia - aplastic anemia; Lymphedema Negative for: Hemophilia; Human Immunodeficiency Virus; Sickle Cell Disease Gastrointestinal Medical History: Negative for: Cirrhosis ; Colitis; Crohnos; Hepatitis A; Hepatitis B; Hepatitis C Endocrine Medical History: Positive for: Type II Diabetes Negative for: Type I Diabetes Time with diabetes: since 2006 Treated with: Insulin Blood sugar tested every day: Yes Tested : Blood sugar testing results: Breakfast: 209 Genitourinary Medical History: Negative for: End Stage Renal Disease Immunological Medical History: Negative for: Lupus Erythematosus; Raynaudos; Scleroderma Integumentary (Skin) Medical History: Negative for: History of Burn; History of pressure wounds Musculoskeletal Medical History: Positive for: Osteoarthritis Negative for: Gout; Rheumatoid Arthritis; Osteomyelitis  Neurologic Medical History: Positive for: Neuropathy Negative for: Dementia; Quadriplegia; Paraplegia; Seizure Disorder Past Medical History Notes: CVA - 1992 Oncologic Medical History: Positive for: Received Chemotherapy - last dose 2006 Negative for: Received Radiation Immunizations Pneumococcal Vaccine: JESTON, JUNKINS ([id number]) Received Pneumococcal Vaccination: Yes Implantable Devices No devices added Hospitalization / Surgery  History Type of Hospitalization/Surgery bronchitis Family and Social History Cancer: Yes - Paternal Grandparents; Diabetes: No; Heart Disease: No; Hereditary Spherocytosis: No; Hypertension: No; Kidney Disease: Yes - Siblings; Lung Disease: Yes - Mother; Seizures: No; Stroke: No; Thyroid Problems: No; Tuberculosis: No; Never smoker; Marital Status - Married; Alcohol Use: Never; Drug Use: No History; Caffeine Use: Never; Financial Concerns: No; Food, Clothing or Shelter Needs: No; Support System Lacking: No; Transportation Concerns: No Physician Affirmation I have reviewed and agree with the above information. Electronic Signature(s) Signed: 10/14/2018 9:44:32 AM By: Worthy Keeler PA-C Signed: 10/14/2018 5:03:03 PM By: Gretta Cool, BSN, RN, CWS, Kim RN, BSN Entered By: Worthy Keeler on 10/14/2018 09:40:16 Adam Merritt ([id number]) -------------------------------------------------------------------------------- SuperBill Details Patient Name: Adam Merritt Date of Service: 10/10/2018 Medical Record Number: [id number] Patient Account Number: [id number] Date of Birth/Sex: March 24, 1946 (73 y.o. M) Treating RN: Cornell Barman Primary Care Provider: [name] Other Clinician: Referring Provider: [name] Treating Provider/Extender: Melburn Hake, [name]: 26 Diagnosis Coding ICD-10 Codes Code Description I89.0 Lymphedema, not elsewhere classified L97.512 Non-pressure chronic ulcer of other part of right foot with fat layer exposed L97.822 Non-pressure chronic ulcer of other part of left lower leg with fat layer exposed L98.492 Non-pressure chronic ulcer of skin of other sites with fat layer exposed E66.9 Obesity, unspecified Facility Procedures CPT4: Description Modifier Quantity Code 27062376 [id number] BILATERAL: Application of multi-layer venous compression system; leg (below 1 knee), including ankle and foot. Physician Procedures CPT4 Code Description: 1761607 [id number] - WC PHYS  LEVEL 4 - EST PT ICD-10 Diagnosis Description I89.0 Lymphedema, not elsewhere classified L97.512 Non-pressure chronic ulcer of other part of right foot with fat L97.822 Non-pressure chronic ulcer of other  part of left lower leg with L98.492 Non-pressure chronic ulcer of skin of other sites with fat layer Modifier: layer exposed fat layer expos exposed Quantity: 1 ed Electronic Signature(s) Signed: 10/11/2018 10:27:32 AM By: Darlin Priestly Signed: 10/14/2018 9:44:32 AM By: Worthy Keeler PA-C Previous Signature: 10/10/2018 5:18:06 PM Version By: Gretta Cool, BSN, RN, CWS, Kim RN, BSN Entered By: Darlin Priestly on 10/11/2018 10:27:32

## 2018-10-14 NOTE — Progress Notes (Signed)
[facility] [place], [place] [address]  Internal MEDICINE  Office Visit Note  Patient Name: Adam Merritt  [id number]  [id number]  Date of Service: 10/15/2018  Chief Complaint  Patient presents with  . Medical Management of Chronic Issues    4wk follow up, pt mentioned feet is hurting and the medication for it isnt working    The patient is c/o increased pain in both legs.Fentanyl patch dose increased to 62.19mcg at his last visit, changed every 3 days. Has not helped at all, in fact, legs are more painful than at last visit. ,  He states that pain is about 8/10 in severity, can be as low as 5 or 6/10 in severity with the fentanyl patch. Would do better if pain was at level of 3/10 in severity.  I did increase the strength of his fentanyl patches to 6.25mcg/hour at his last visit, but this adjustment has not made much of a difference in tolerance of pain. He is back at wound care for chronic and recurring wounds to bilateral lowe legs. They are currently wrapped in clean dry dressings and pressure bandages, applied per wound clinic. They started him on doxycycline on 09/13/2018, which was first visit back with wound management . His blood pressure is well controlled as are blood sugars. His HgbA1c is improved today at 6.5.       Current Medication: Outpatient Encounter Medications as of 10/14/2018  Medication Sig  . albuterol (PROVENTIL HFA;VENTOLIN HFA) 108 (90 Base) MCG/ACT inhaler INHALE 1 PUFF INTO THE LUNGS EVERY 6 HOURS AS NEEDED FOR WHEEZING OR SHORTNESS OF BREATH (Patient taking differently: Inhale 1 puff into the lungs every 6 (six) hours as needed for wheezing or shortness of breath. )  . aspirin 325 MG EC tablet Take 325 mg by mouth daily.  Marland Kitchen atorvastatin (LIPITOR) 40 MG tablet Take 40 mg by mouth at bedtime.   . carvedilol (COREG) 25 MG tablet Take 25 mg by mouth 2 (two) times daily with a meal.  . doxycycline (VIBRAMYCIN) 100 MG capsule TK ONE C PO BID  FOR 10 DAYS. DO NOT TK MULTIVITAMIN WITH THIS MEDICATION  . febuxostat (ULORIC) 40 MG tablet Take 1 tablet (40 mg total) by mouth daily.  . furosemide (LASIX) 40 MG tablet Take 1 tablet (40 mg total) by mouth daily.  Marland Kitchen gabapentin (NEURONTIN) 300 MG capsule Take 1 capsule po in AM and 1 capsule po in pm. Take 2 capsules po at bedtime. (Patient taking differently: Take 2 cap in the morning, 1 cap in the evening and 1 cap at night.)  . hydrALAZINE (APRESOLINE) 25 MG tablet   . HYDROcodone-acetaminophen (NORCO/VICODIN) 5-325 MG tablet Take 1 tablet by mouth every 6 (six) hours as needed for moderate pain or severe pain.  . indapamide (LOZOL) 2.5 MG tablet Take 1 tablet (2.5 mg total) by mouth daily.  . Insulin Glargine (BASAGLAR KWIKPEN) 100 UNIT/ML SOPN INJECT 30 TO 36 UNITS UNDER THE SKIN EVERY DAY (Patient taking differently: Inject 30 Units into the skin daily. )  . ipratropium-albuterol (DUONEB) 0.5-2.5 (3) MG/3ML SOLN Take 3 mLs by nebulization every 4 (four) hours as needed (for shortness of breath).  . Multiple Vitamin (MULTIVITAMIN WITH MINERALS) TABS tablet Take 1 tablet by mouth daily.  . pentoxifylline (TRENTAL) 400 MG CR tablet TAKE 1 TABLET BY MOUTH THREE TIMES DAILY FOR CLAUDICATION  . potassium chloride SA (K-DUR) 20 MEQ tablet Take 1 tablet (20 mEq total) by mouth 3 (three)  times daily.  . sennosides-docusate sodium (SENOKOT-S) 8.6-50 MG tablet Take 1-2 tablets by mouth 2 (two) times daily.  . sertraline (ZOLOFT) 50 MG tablet 50 mg daily.  . traZODone (DESYREL) 50 MG tablet Take 50 mg by mouth at bedtime.  Marland Kitchen VICTOZA 18 MG/3ML SOPN INJECT 1.8MG  INTO THE SKIN DAILY  . [DISCONTINUED] fentaNYL 62.5 MCG/HR PT72 Place 1 patch onto the skin every 3 (three) days.  . fentaNYL (DURAGESIC) 75 MCG/HR Place 1 patch onto the skin every 3 (three) days.   No facility-administered encounter medications on file as of 10/14/2018.     Surgical History: Past Surgical History:  Procedure Laterality  Date  . 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 CHOLECYSTECTOMY    . CORONARY ARTERY BYPASS GRAFT      Medical History: Past Medical History:  Diagnosis Date  . Anemia   . CAD (coronary artery disease)   . Chest pain   . Coronary artery disease   . Diabetes mellitus without complication ([place])   . Diastolic CHF ([place])   . Esophageal reflux   . Herpes zoster without mention of complication   . Hyperlipidemia   . Hypertension   . Insomnia   . Lumbago   . Lymphedema   . Neuropathy in diabetes ([place])   . Obstructive chronic bronchitis without exacerbation ([place])   . Other malaise and fatigue   . Stroke ([place])   . Varicose veins     Family History: Family History  Problem Relation Age of Onset  . Diabetes Mother   . Hyperlipidemia Mother   . Hypertension Mother   . Diabetes Father   . Hyperlipidemia Father   . Hypertension Father   . CAD Father     Social History   Socioeconomic History  . Marital status: Married    Spouse name: Not on file  . Number of children: Not on file  . Years of education: Not on file  . Highest education level: Not on file  Occupational History  . Not on file  Social Needs  . Financial resource strain: Not on file  . Food insecurity:    Worry: Not on file    Inability: Not on file  . Transportation needs:    Medical: Not on file    Non-medical: Not on file  Tobacco Use  . Smoking status: Never Smoker  . Smokeless tobacco: Never Used  Substance and Sexual Activity  . Alcohol use: No    Comment: quit alcohol 30 years ago  . Drug use: No  . Sexual activity: Not on file  Lifestyle  . Physical activity:    Days per week: Not on file    Minutes per session: Not on file  . Stress: Not on file  Relationships  . Social connections:    Talks on phone: Not on file    Gets together: Not on file    Attends religious service:  Not on file    Active member of club or organization: Not on file    Attends meetings of clubs or organizations: Not on file    Relationship status: Not on file  . Intimate partner violence:    Fear of current or ex partner: Not on file    Emotionally abused: Not on file    Physically abused: Not on file    Forced sexual activity: Not on file  Other  Topics Concern  . Not on file  Social History Narrative   Lives at home with wife, uses a wheelchair now      Review of Systems  Constitutional: Positive for activity change and fatigue.  HENT: Negative for congestion and postnasal drip.   Respiratory: Positive for shortness of breath. Negative for cough and chest tightness.   Cardiovascular: Positive for leg swelling. Negative for chest pain and palpitations.  Gastrointestinal: Negative for constipation, diarrhea, nausea and vomiting.  Endocrine: Negative for cold intolerance, heat intolerance, polydipsia and polyuria.       Blood sugars doing well   Musculoskeletal: Positive for arthralgias, back pain and myalgias.  Skin: Positive for rash and wound.  Allergic/Immunologic: Positive for environmental allergies.  Neurological: Negative for dizziness, light-headedness and headaches.  Hematological: Negative for adenopathy.  Psychiatric/Behavioral: Positive for dysphoric mood. Negative for agitation. The patient is nervous/anxious.     Today's Vitals   10/14/18 1142  BP: 135/68  Pulse: 68  Resp: 16  SpO2: 99%  Weight: (!) 303 lb 6.4 oz (137.6 kg)  Height: 5\' 6"  (1.676 m)   Body mass index is 48.97 kg/m.  Physical Exam Vitals signs and nursing note reviewed.  Constitutional:      General: He is not in acute distress.    Appearance: He is well-developed. He is obese. He is not diaphoretic.  HENT:     Head: Normocephalic and atraumatic.     Nose: Nose normal.     Mouth/Throat:     Pharynx: No oropharyngeal exudate.  Eyes:     Conjunctiva/sclera: Conjunctivae normal.      Pupils: Pupils are equal, round, and reactive to light.  Neck:     Musculoskeletal: Normal range of motion and neck supple.     Thyroid: No thyromegaly.     Vascular: No JVD.     Trachea: No tracheal deviation.  Cardiovascular:     Rate and Rhythm: Normal rate and regular rhythm.     Heart sounds: Normal heart sounds. No murmur. No friction rub. No gallop.   Pulmonary:     Effort: Pulmonary effort is normal. No respiratory distress.     Breath sounds: Normal breath sounds. No rales.  Chest:     Chest wall: No tenderness.  Musculoskeletal: Normal range of motion.     Comments: Moderate to severe lower leg pain, bilaterally. Worse on the right side, especially after he "twisted it" this morning in the bathroom. Pain is severe from just above the heel and into the calf. Hurts to flex and dorsiflex the right foot. Strength is diminished. Having very difficult time putting any weight on the right foot.   Lymphadenopathy:     Cervical: No cervical adenopathy.  Skin:    General: Skin is warm and dry.     Comments: Bilateral lower extremities wrapped in clean dry, pressure dressings, applied per wound management.   Neurological:     Mental Status: He is alert and oriented to person, place, and time. Mental status is at baseline.     Cranial Nerves: No cranial nerve deficit.  Psychiatric:        Behavior: Behavior normal.        Thought Content: Thought content normal.        Judgment: Judgment normal.    Assessment/Plan: 1. Chronic pain disorder Increase fentanyl patch to 34mcg/hour. Change patch every 3 days. New prescription sent to his pharmacy.  - fentaNYL (DURAGESIC) 75 MCG/HR; Place 1 patch onto the skin  every 3 (three) days.  Dispense: 10 patch; Refill: 0  2. Diabetic polyneuropathy associated with type 2 diabetes mellitus ([place]) Continue diabetic medication including gabapentin as prescribed   3. Lymphedema of both lower extremities Continue regular visits with wound management  as scheduled   General Counseling: Judd verbalizes understanding of the findings of todays visit and agrees with plan of treatment. I have discussed any further diagnostic evaluation that may be needed or ordered today. We also reviewed his medications today. he has been encouraged to call the office with any questions or concerns that should arise related to todays visit.  Reviewed risks and possible side effects associated with taking opiates, benzodiazepines and other CNS depressants. Combination of these could cause dizziness and drowsiness. Advised patient not to drive or operate machinery when taking these medications, as patient's and other's life can be at risk and will have consequences. Patient verbalized understanding in this matter. Dependence and abuse for these drugs will be monitored closely. A Controlled substance policy and procedure is on file which allows [facility] to order a urine drug screen test at any visit. Patient understands and agrees with the plan  This patient was seen by [name] FNP Collaboration with Dr [name] as a part of collaborative care agreement  Meds ordered this encounter  Medications  . fentaNYL (DURAGESIC) 75 MCG/HR    Sig: Place 1 patch onto the skin every 3 (three) days.    Dispense:  10 patch    Refill:  0    Please note that dose changed.    Order Specific Question:   Supervising Provider    Answer:   [name] [[id number]]    Time spent: 55 Minutes      Dr [name] Internal medicine

## 2018-10-14 NOTE — Progress Notes (Signed)
Adam, Merritt ([id number]) Visit Report for 10/10/2018 Arrival Information Details Patient Name: Adam Merritt, Adam Merritt Date of Service: 10/10/2018 3:30 PM Medical Record Number: [id number] Patient Account Number: [id number] Date of Birth/Sex: [birth year] (73 y.o. M) Treating RN: Cornell Barman Primary Care Mima Cranmore: [name] Other Clinician: Referring Dawnetta Copenhaver: [name] Treating Ahleah Simko/Extender: Melburn Hake, [name]: 55 Visit Information History Since Last Visit Added or deleted any medications: No Patient Arrived: Walker Any new allergies or adverse reactions: No Arrival Time: 15:38 Had a fall or experienced change in No Accompanied By: self activities of daily living that may affect Transfer Assistance: None risk of falls: Patient Identification Verified: Yes Signs or symptoms of abuse/neglect since last visito No Secondary Verification Process Yes Hospitalized since last visit: No Completed: Implantable device outside of the clinic excluding No Patient Has Alerts: Yes cellular tissue based products placed in the center Patient Alerts: Patient on Blood since last visit: Thinner Has Dressing in Place as Prescribed: Yes Aspirin 325mg  Pain Present Now: Yes Type II Diabetes ABI 5/19 L 1.1 R 0.9 TBI L 0.6 R 0.6 Electronic Signature(s) Signed: 10/10/2018 5:18:06 PM By: Gretta Cool, BSN, RN, CWS, Kim RN, BSN Entered By: Gretta Cool, BSN, RN, CWS, Kim on 10/10/2018 15:40:04 Adam Merritt ([id number]) -------------------------------------------------------------------------------- Compression Therapy Details Patient Name: Adam Merritt Date of Service: 10/10/2018 3:30 PM Medical Record Number: [id number] Patient Account Number: [id number] Date of Birth/Sex: 09-25-1945 (73 y.o. M) Treating RN: Cornell Barman Primary Care Shearon Clonch: [name] Other Clinician: Referring Ioma Chismar: [name] Treating Makih Stefanko/Extender: Melburn Hake, [name]: 26 Compression Therapy  Performed for Wound Assessment: Wound #19R Left,Distal,Posterior Lower Leg Performed By: Clinician Cornell Barman, RN Compression Type: Three Layer Pre Treatment ABI: 1.1 Post Procedure Diagnosis Same as Pre-procedure Electronic Signature(s) Signed: 10/10/2018 4:38:20 PM By: Gretta Cool, BSN, RN, CWS, Kim RN, BSN Entered By: Gretta Cool, BSN, RN, CWS, Kim on 10/10/2018 16:38:20 Adam Merritt ([id number]) -------------------------------------------------------------------------------- Compression Therapy Details Patient Name: Adam Merritt Date of Service: 10/10/2018 3:30 PM Medical Record Number: [id number] Patient Account Number: [id number] Date of Birth/Sex: March 24, 1946 (73 y.o. M) Treating RN: Cornell Barman Primary Care Nour Scalise: [name] Other Clinician: Referring Lariyah Shetterly: [name] Treating Merry Pond/Extender: Melburn Hake, [name]: 26 Compression Therapy Performed for Wound Assessment: Wound #20 Right,Posterior Lower Leg Performed By: Clinician Cornell Barman, RN Compression Type: Three Layer Pre Treatment ABI: 1.1 Post Procedure Diagnosis Same as Pre-procedure Electronic Signature(s) Signed: 10/10/2018 4:38:20 PM By: Gretta Cool, BSN, RN, CWS, Kim RN, BSN Entered By: Gretta Cool, BSN, RN, CWS, Kim on 10/10/2018 16:38:20 Adam Merritt ([id number]) -------------------------------------------------------------------------------- Encounter Discharge Information Details Patient Name: Adam Merritt Date of Service: 10/10/2018 3:30 PM Medical Record Number: [id number] Patient Account Number: [id number] Date of Birth/Sex: Sep 24, 1945 (73 y.o. M) Treating RN: Cornell Barman Primary Care Delynda Sepulveda: [name] Other Clinician: Referring Rhyann Berton: [name] Treating Jolyne Laye/Extender: Melburn Hake, [name]: 80 Encounter Discharge Information Items Discharge Condition: Stable Ambulatory Status: Ambulatory Discharge Destination: Home Transportation: Private Auto Accompanied By:  self Schedule Follow-up Appointment: Yes Clinical Summary of Care: Electronic Signature(s) Signed: 10/10/2018 4:44:15 PM By: Gretta Cool, BSN, RN, CWS, Kim RN, BSN Entered By: Gretta Cool, BSN, RN, CWS, Kim on 10/10/2018 16:44:15 Adam Merritt ([id number]) -------------------------------------------------------------------------------- Lower Extremity Assessment Details Patient Name: Adam Merritt Date of Service: 10/10/2018 3:30 PM Medical Record Number: [id number] Patient Account Number: [id number] Date of Birth/Sex: Nov 17, 1945 (73 y.o. M) Treating RN: Cornell Barman Primary Care Myrtis Maille: [name] Other Clinician: Referring Teren Zurcher: Humphrey Rolls,  FOZIA Treating Stuart Mirabile/Extender: STONE III, [name]: 26 Edema Assessment Assessed: [Left: No] [Right: No] [Left: Edema] [Right: :] Calf Left: Right: Point of Measurement: 37 cm From Medial Instep 62 cm 57 cm Ankle Left: Right: Point of Measurement: 12 cm From Medial Instep 36.5 cm 32 cm Vascular Assessment Pulses: Dorsalis Pedis Palpable: [Left:Yes] [Right:Yes] Electronic Signature(s) Signed: 10/10/2018 5:18:06 PM By: Gretta Cool, BSN, RN, CWS, Kim RN, BSN Entered By: Gretta Cool, BSN, RN, CWS, Kim on 10/10/2018 15:50:29 Adam Merritt ([id number]) -------------------------------------------------------------------------------- Multi Wound Chart Details Patient Name: Adam Merritt Date of Service: 10/10/2018 3:30 PM Medical Record Number: [id number] Patient Account Number: [id number] Date of Birth/Sex: [birth year] (73 y.o. 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 Extremity Comorbid History: Anemia, Lymphedema, Anemia, Lymphedema, N/A Chronic Obstructive Chronic Obstructive Pulmonary Disease (COPD), Pulmonary Disease (COPD), Sleep Apnea, Congestive Sleep Apnea, Congestive Heart Failure, Coronary Artery Heart Failure, Coronary Artery Disease, Hypertension, Disease, Hypertension, Peripheral Venous Disease, Peripheral Venous Disease, Type II Diabetes, Type II Diabetes, Osteoarthritis, Neuropathy, Osteoarthritis, Neuropathy, Received Chemotherapy Received Chemotherapy Date Acquired: 09/12/2018 10/02/2018 N/A Weeks of Treatment: 3 0 N/A Wound Status: Open Open N/A Wound Recurrence: Yes No N/A Measurements L x W x D 1.2x3x0.1 3x4x0.1 N/A (cm) Area (cm) : 2.827 9.425 N/A Volume (cm) : 0.283 0.942 N/A % Reduction in Area: 0.00% 40.00% N/A % Reduction in Volume: 0.00% 40.00% N/A Classification: Grade 1 Grade 2 N/A Exudate Amount: Medium Large N/A Exudate Type: Serous Serous N/A Exudate Color: amber amber N/A XAYDEN, LINSEY ([id number]) Wound Margin: Flat and Intact Flat and Intact N/A Granulation Amount: Large (67-100%) Medium (34-66%) N/A Granulation Quality: Pink Pink N/A Necrotic Amount: None Present (0%) Small (1-33%) N/A Exposed Structures: Fascia: No Fat Layer (Subcutaneous N/A Fat Layer (Subcutaneous Tissue) Exposed: Yes Tissue) Exposed: No Fascia: No Tendon: No Tendon: No Muscle: No Muscle: No Joint: No Joint: No Bone: No Bone: No Limited to Skin Breakdown Epithelialization: None Large (67-100%) N/A Periwound Skin Texture: Excoriation: No No Abnormalities Noted N/A Induration: No Callus: No Crepitus: No Rash: No Scarring: No Periwound Skin Moisture: Maceration: Yes Maceration: Yes N/A Dry/Scaly: No Periwound Skin Color: Hemosiderin  Staining: Yes No Abnormalities Noted N/A Atrophie Blanche: No Cyanosis: No Ecchymosis: No Erythema: No Mottled: No Pallor: No Rubor: No Temperature: No Abnormality N/A N/A Tenderness on Palpation: No No N/A Treatment Notes Electronic Signature(s) Signed: 10/10/2018 4:37:41 PM By: Gretta Cool, BSN, RN, CWS, Kim RN, BSN Entered By: Gretta Cool, BSN, RN, CWS, Kim on 10/10/2018 16:37:39 Adam Merritt ([id number]) -------------------------------------------------------------------------------- [name] Llorens Torres Details Patient Name: Adam Merritt Date of Service: 10/10/2018 3:30 PM Medical Record Number: [id number] Patient Account Number: [id number] Date of Birth/Sex: 17-Jun-1945 (73 y.o. M) Treating RN: Cornell Barman Primary Care Eloyce Bultman: [name] Other Clinician: Referring Jeanmarie Mccowen: [name] Treating Min Collymore/Extender: Melburn Hake, [name]: 82 Active Inactive Abuse / Safety / Falls / Self Care Management Nursing Diagnoses: Potential for falls Goals: Patient will remain injury free related to falls Date Initiated: 04/09/2018 Target Resolution Date: 10/05/2018 Goal Status: Active Interventions: Assess  fall risk on admission and as needed Notes: Venous Leg Ulcer Nursing Diagnoses: Actual venous Insuffiency (use after diagnosis is confirmed) Goals: Patient will maintain optimal edema control Date Initiated: 04/09/2018 Target Resolution Date: 10/05/2018 Goal Status: Active Interventions: Assess peripheral edema status every visit. Compression as ordered Provide education on venous insufficiency Notes: Electronic Signature(s) Signed: 10/10/2018 5:18:06 PM By: Gretta Cool, BSN, RN, CWS, Kim RN, BSN Entered By: Gretta Cool, BSN, RN, CWS, Kim on 10/10/2018 15:50:37 Adam Merritt ([id number]) -------------------------------------------------------------------------------- Pain Assessment Details Patient Name: Adam Merritt Date of Service: 10/10/2018 3:30 PM Medical Record  Number: [id number] Patient Account Number: [id number] Date of Birth/Sex: February 10, 1946 (73 y.o. M) Treating RN: Cornell Barman Primary Care Bobbe Quilter: [name] Other Clinician: Referring Reymundo Winship: [name] Treating Charelle Petrakis/Extender: Melburn Hake, [name]: 38 Active Problems Location of Pain Severity and Description of Pain Patient Has Paino Yes Site Locations Pain Location: Generalized Pain Pain Management and Medication Current Pain Management: Notes Feet neuropathy Electronic Signature(s) Signed: 10/10/2018 5:18:06 PM By: Gretta Cool, BSN, RN, CWS, Kim RN, BSN Entered By: Gretta Cool, BSN, RN, CWS, Kim on 10/10/2018 15:40:23 Adam Merritt ([id number]) -------------------------------------------------------------------------------- Patient/Caregiver Education Details Patient Name: Adam Merritt Date of Service: 10/10/2018 3:30 PM Medical Record Number: [id number] Patient Account Number: [id number] Date of Birth/Gender: 10-Jan-1946 (73 y.o. M) Treating RN: Cornell Barman Primary Care Physician: [name] Other Clinician: Referring Physician: [name] Treating Physician/Extender: Sharalyn Ink in Treatment: 25 Education Assessment Education Provided To: Patient Education Topics Provided Venous: Handouts: Controlling Swelling with Multilayered Compression Wraps Responses: State content correctly Electronic Signature(s) Signed: 10/10/2018 5:18:06 PM By: Gretta Cool, BSN, RN, CWS, Kim RN, BSN Entered By: Gretta Cool, BSN, RN, CWS, Kim on 10/10/2018 16:41:29 Adam Merritt ([id number]) -------------------------------------------------------------------------------- Wound Assessment Details Patient Name: Adam Merritt Date of Service: 10/10/2018 3:30 PM Medical Record Number: [id number] Patient Account Number: [id number] Date of Birth/Sex: [birth year] (73 y.o. M) Treating RN: Cornell Barman Primary Care Aurelius Gildersleeve: [name] Other Clinician: Referring Jebadiah Imperato: [name] Treating  Alishah Schulte/Extender: Melburn Hake, [name]: 26 Wound Status Wound Number: 19R Primary Diabetic Wound/Ulcer of the Lower Extremity Etiology: Wound Location: Left Lower Leg - Posterior, Distal Wound Open Wounding Event: Gradually Appeared Status: Date Acquired: 09/12/2018 Comorbid Anemia, Lymphedema, Chronic Obstructive Weeks Of Treatment: 3 History: Pulmonary Disease (COPD), Sleep Apnea, Clustered Wound: No Congestive Heart Failure, Coronary Artery Disease, Hypertension, Peripheral Venous Disease, Type II Diabetes, Osteoarthritis, Neuropathy, Received Chemotherapy Photos Photo Uploaded By: Gretta Cool, BSN, RN, CWS, Kim on 10/10/2018 15:51:47 Wound Measurements Length: (cm) 1.2 Width: (cm) 3 Depth: (cm) 0.1 Area: (cm) 2.827 Volume: (cm) 0.283 % Reduction in Area: 0% % Reduction in Volume: 0% Epithelialization: None Tunneling: No Undermining: No Wound Description Classification: Grade 1 Foul Od Wound Margin: Flat and Intact Slough/ Exudate Amount: Medium Exudate Type: Serous Exudate Color: amber or After Cleansing: No Fibrino No Wound Bed Granulation Amount: Large (67-100%) Exposed Structure Granulation Quality: Pink Fascia Exposed: No Necrotic Amount: None Present (0%) Fat Layer (Subcutaneous Tissue) Exposed: No Tendon Exposed: No Muscle Exposed: No Joint Exposed: No BUBBER, ROTHERT ([id number]) Bone Exposed: No Limited to Skin Breakdown Periwound Skin Texture Texture Color No Abnormalities Noted: No No Abnormalities Noted: No Callus: No Atrophie Blanche: No Crepitus: No Cyanosis: No Excoriation: No Ecchymosis: No Induration: No Erythema: No Rash: No Hemosiderin Staining: Yes Scarring: No Mottled: No Pallor: No Moisture Rubor: No No Abnormalities Noted: No Dry / Scaly: No Temperature / Pain Maceration: Yes Temperature: No Abnormality Treatment Notes Wound #  19R (Left, Distal, Posterior Lower Leg) Notes Silver cell with 3 layer  bilateral Electronic Signature(s) Signed: 10/10/2018 5:18:06 PM By: Gretta Cool, BSN, RN, CWS, Kim RN, BSN Entered By: Gretta Cool, BSN, RN, CWS, Kim on 10/10/2018 15:46:24 Adam Merritt ([id number]) -------------------------------------------------------------------------------- Wound Assessment Details Patient Name: Adam Merritt Date of Service: 10/10/2018 3:30 PM Medical Record Number: [id number] Patient Account Number: [id number] Date of Birth/Sex: 1946/05/18 (73 y.o. M) Treating RN: Cornell Barman Primary Care Shellie Rogoff: [name] Other Clinician: Referring Bryn Perkin: [name] Treating Jalan Bodi/Extender: Melburn Hake, [name]: 26 Wound Status Wound Number: 20 Primary Diabetic Wound/Ulcer of the Lower Extremity Etiology: Wound Location: Right Lower Leg - Posterior Wound Open Wounding Event: Gradually Appeared Status: Date Acquired: 10/02/2018 Comorbid Anemia, Lymphedema, Chronic Obstructive Weeks Of Treatment: 0 History: Pulmonary Disease (COPD), Sleep Apnea, Clustered Wound: No Congestive Heart Failure, Coronary Artery Disease, Hypertension, Peripheral Venous Disease, Type II Diabetes, Osteoarthritis, Neuropathy, Received Chemotherapy Photos Photo Uploaded By: Gretta Cool, BSN, RN, CWS, Kim on 10/10/2018 15:51:47 Wound Measurements Length: (cm) 3 Width: (cm) 4 Depth: (cm) 0.1 Area: (cm) 9.425 Volume: (cm) 0.942 % Reduction in Area: 40% % Reduction in Volume: 40% Epithelialization: Large (67-100%) Tunneling: No Undermining: No Wound Description Classification: Grade 2 Foul Odor Wound Margin: Flat and Intact Slough/Fi Exudate Amount: Large Exudate Type: Serous Exudate Color: amber After Cleansing: No brino Yes Wound Bed Granulation Amount: Medium (34-66%) Exposed Structure Granulation Quality: Pink Fascia Exposed: No Necrotic Amount: Small (1-33%) Fat Layer (Subcutaneous Tissue) Exposed: Yes Necrotic Quality: Adherent Slough Tendon Exposed: No Muscle  Exposed: No Joint Exposed: No JEWELL, RYANS ([id number]) Bone Exposed: No Periwound Skin Texture Texture Color No Abnormalities Noted: No No Abnormalities Noted: No Moisture No Abnormalities Noted: No Maceration: Yes Treatment Notes Wound #20 (Right, Posterior Lower Leg) Notes Silver cell with 3 layer bilateral Electronic Signature(s) Signed: 10/10/2018 5:18:06 PM By: Gretta Cool, BSN, RN, CWS, Kim RN, BSN Entered By: Gretta Cool, BSN, RN, CWS, Kim on 10/10/2018 15:46:52 Adam Merritt ([id number]) -------------------------------------------------------------------------------- Vitals Details Patient Name: Adam Merritt Date of Service: 10/10/2018 3:30 PM Medical Record Number: [id number] Patient Account Number: [id number] Date of Birth/Sex: [birth year] (73 y.o. M) Treating RN: Cornell Barman Primary Care Orva Gwaltney: [name] Other Clinician: Referring Aja Whitehair: [name] Treating Paiden Cavell/Extender: Melburn Hake, [name]: 26 Vital Signs Time Taken: 15:40 Temperature (F): 97.6 Height (in): 66 Pulse (bpm): 70 Weight (lbs): 323 Respiratory Rate (breaths/min): 18 Body Mass Index (BMI): 52.1 Blood Pressure (mmHg): 153/67 Reference Range: 80 - 120 mg / dl Electronic Signature(s) Signed: 10/10/2018 5:18:06 PM By: Gretta Cool, BSN, RN, CWS, Kim RN, BSN Entered By: Gretta Cool, BSN, RN, CWS, Kim on 10/10/2018 15:40:50

## 2018-10-17 ENCOUNTER — Other Ambulatory Visit: Payer: Self-pay

## 2018-10-17 ENCOUNTER — Encounter: Payer: Medicare Other | Admitting: Physician Assistant

## 2018-10-17 DIAGNOSIS — E11621 Type 2 diabetes mellitus with foot ulcer: Secondary | ICD-10-CM | POA: Diagnosis not present

## 2018-10-17 NOTE — Progress Notes (Signed)
NAKHI, CHOI ([id number]) Visit Report for 10/17/2018 Arrival Information Details Patient Name: Adam Merritt, Adam Merritt Date of Service: 10/17/2018 12:30 PM Medical Record Number: [id number] Patient Account Number: [id number] Date of Birth/Sex: 11-Aug-1945 (73 y.o. M) Treating RN: Montey Hora Primary Care Sheri Prows: [name] Other Clinician: Referring Treven Holtman: [name] Treating Kyriaki Moder/Extender: Melburn Hake, [name]: 74 Visit Information History Since Last Visit Added or deleted any medications: No Patient Arrived: Cane Any new allergies or adverse reactions: No Arrival Time: 12:40 Had a fall or experienced change in No Accompanied By: self activities of daily living that may affect Transfer Assistance: None risk of falls: Patient Identification Verified: Yes Signs or symptoms of abuse/neglect since last visito No Secondary Verification Process Yes Hospitalized since last visit: No Completed: Implantable device outside of the clinic excluding No Patient Has Alerts: Yes cellular tissue based products placed in the center Patient Alerts: Patient on Blood since last visit: Thinner Has Dressing in Place as Prescribed: Yes Aspirin 325mg  Has Compression in Place as Prescribed: Yes Type II Diabetes ABI 5/19 L 1.1 R 0.9 Pain Present Now: No TBI L 0.6 R 0.6 Electronic Signature(s) Signed: 10/17/2018 1:37:45 PM By: Montey Hora Entered By: Montey Hora on 10/17/2018 12:40:48 Adam Merritt ([id number]) -------------------------------------------------------------------------------- Clinic Level of Care Assessment Details Patient Name: Adam Merritt Date of Service: 10/17/2018 12:30 PM Medical Record Number: [id number] Patient Account Number: [id number] Date of Birth/Sex: 09-Oct-1945 (73 y.o. M) Treating RN: Cornell Barman Primary Care Kaida Games: [name] Other Clinician: Referring Joscelin Fray: [name] Treating Chivon Lepage/Extender: Melburn Hake, [name] in  Treatment: 69 Clinic Level of Care Assessment Items TOOL 4 Quantity Score []  - Use when only an EandM is performed on FOLLOW-UP visit 0 ASSESSMENTS - Nursing Assessment / Reassessment []  - Reassessment of Co-morbidities (includes updates in patient status) 0 X- 1 5 Reassessment of Adherence to Treatment Plan ASSESSMENTS - Wound and Skin Assessment / Reassessment X - Simple Wound Assessment / Reassessment - one wound 1 5 []  - 0 Complex Wound Assessment / Reassessment - multiple wounds []  - 0 Dermatologic / Skin Assessment (not related to wound area) ASSESSMENTS - Focused Assessment []  - Circumferential Edema Measurements - multi extremities 0 []  - 0 Nutritional Assessment / Counseling / Intervention []  - 0 Lower Extremity Assessment (monofilament, tuning fork, pulses) []  - 0 Peripheral Arterial Disease Assessment (using hand held doppler) ASSESSMENTS - Ostomy and/or Continence Assessment and Care []  - Incontinence Assessment and Management 0 []  - 0 Ostomy Care Assessment and Management (repouching, etc.) PROCESS - Coordination of Care []  - Simple Patient / Family Education for ongoing care 0 []  - 0 Complex (extensive) Patient / Family Education for ongoing care []  - 0 Staff obtains Programmer, systems, Records, Test Results / Process Orders []  - 0 Staff telephones HHA, Nursing Homes / Clarify orders / etc []  - 0 Routine Transfer to another Facility (non-emergent condition) []  - 0 Routine Hospital Admission (non-emergent condition) []  - 0 New Admissions / Biomedical engineer / Ordering NPWT, Apligraf, etc. []  - 0 Emergency Hospital Admission (emergent condition) X- 1 10 Simple Discharge Coordination Adam Merritt, Adam Merritt ([id number]) []  - 0 Complex (extensive) Discharge Coordination PROCESS - Special Needs []  - Pediatric / Minor Patient Management 0 []  - 0 Isolation Patient Management []  - 0 Hearing / Language / Visual special needs []  - 0 Assessment of Community assistance  (transportation, D/C planning, etc.) []  - 0 Additional assistance / Altered mentation []  - 0 Support Surface(s) Assessment (bed, cushion, seat,  etc.) INTERVENTIONS - Wound Cleansing / Measurement X - Simple Wound Cleansing - one wound 1 5 []  - 0 Complex Wound Cleansing - multiple wounds X- 1 5 Wound Imaging (photographs - any number of wounds) []  - 0 Wound Tracing (instead of photographs) X- 1 5 Simple Wound Measurement - one wound []  - 0 Complex Wound Measurement - multiple wounds INTERVENTIONS - Wound Dressings []  - Small Wound Dressing one or multiple wounds 0 []  - 0 Medium Wound Dressing one or multiple wounds []  - 0 Large Wound Dressing one or multiple wounds []  - 0 Application of Medications - topical []  - 0 Application of Medications - injection INTERVENTIONS - Miscellaneous []  - External ear exam 0 []  - 0 Specimen Collection (cultures, biopsies, blood, body fluids, etc.) []  - 0 Specimen(s) / Culture(s) sent or taken to Lab for analysis []  - 0 Patient Transfer (multiple staff / Civil Service fast streamer / Similar devices) []  - 0 Simple Staple / Suture removal (25 or less) []  - 0 Complex Staple / Suture removal (26 or more) []  - 0 Hypo / Hyperglycemic Management (close monitor of Blood Glucose) []  - 0 Ankle / Brachial Index (ABI) - do not check if billed separately X- 1 5 Vital Signs Adam Merritt, Adam Merritt ([id number]) Has the patient been seen at the hospital within the last three years: Yes Total Score: 40 Level Of Care: New/Established - Level 2 Electronic Signature(s) Signed: 10/17/2018 4:49:14 PM By: Gretta Cool, BSN, RN, CWS, Kim RN, BSN Entered By: Gretta Cool, BSN, RN, CWS, Kim on 10/17/2018 13:09:28 Adam Merritt ([id number]) -------------------------------------------------------------------------------- Encounter Discharge Information Details Patient Name: Adam Merritt Date of Service: 10/17/2018 12:30 PM Medical Record Number: [id number] Patient Account Number:  [id number] Date of Birth/Sex: Sep 22, 1945 (73 y.o. M) Treating RN: Harold Barban Primary Care Gerry Heaphy: [name] Other Clinician: Referring Shiza Thelen: [name] Treating Rai Severns/Extender: Melburn Hake, [name]: 7 Encounter Discharge Information Items Discharge Condition: Stable Ambulatory Status: Cane Discharge Destination: Home Transportation: Private Auto Accompanied By: self Schedule Follow-up Appointment: Yes Clinical Summary of Care: Electronic Signature(s) Signed: 10/17/2018 1:21:08 PM By: Harold Barban Entered By: Harold Barban on 10/17/2018 13:21:08 Adam Merritt ([id number]) -------------------------------------------------------------------------------- Lower Extremity Assessment Details Patient Name: Adam Merritt Date of Service: 10/17/2018 12:30 PM Medical Record Number: [id number] Patient Account Number: [id number] Date of Birth/Sex: 04/21/1946 (73 y.o. M) Treating RN: Montey Hora Primary Care Khamiyah Grefe: [name] Other Clinician: Referring Joseantonio Dittmar: [name] Treating [place] Searles/Extender: Melburn Hake, [name]: 27 Edema Assessment Assessed: [Left: No] [Right: No] Edema: [Left: Yes] [Right: Yes] Calf Left: Right: Point of Measurement: 37 cm From Medial Instep 56 cm 49.5 cm Ankle Left: Right: Point of Measurement: 12 cm From Medial Instep 36.5 cm 28 cm Vascular Assessment Pulses: Dorsalis Pedis Palpable: [Left:Yes] [Right:Yes] Electronic Signature(s) Signed: 10/17/2018 1:37:45 PM By: Montey Hora Entered By: Montey Hora on 10/17/2018 12:49:49 Adam Merritt ([id number]) -------------------------------------------------------------------------------- Multi Wound Chart Details Patient Name: Adam Merritt Date of Service: 10/17/2018 12:30 PM Medical Record Number: [id number] Patient Account Number: [id number] Date of Birth/Sex: [birth year] (73 y.o. M) Treating RN: Cornell Barman Primary Care Khyli Swaim: [name]  Other Clinician: Referring Kemia Wendel: [name] Treating Bridney Guadarrama/Extender: Melburn Hake, [name]: 27 Vital Signs Height(in): 66 Pulse(bpm): 81 Weight(lbs): 323 Blood Pressure(mmHg): 141/81 Body Mass Index(BMI): 52 Temperature(F): 98.3 Respiratory Rate 18 (breaths/min): Photos: [N/A:N/A] Wound Location: Left Lower Leg - Posterior, Right Lower Leg - Posterior N/A Distal Wounding Event: Gradually Appeared Gradually Appeared N/A Primary Etiology: Diabetic  Wound/Ulcer of the Diabetic Wound/Ulcer of the N/A Lower Extremity Lower Extremity Comorbid History: Anemia, Lymphedema, Anemia, Lymphedema, N/A Chronic Obstructive Chronic Obstructive Pulmonary Disease (COPD), Pulmonary Disease (COPD), Sleep Apnea, Congestive Sleep Apnea, Congestive Heart Failure, Coronary Artery Heart Failure, Coronary Artery Disease, Hypertension, Disease, Hypertension, Peripheral Venous Disease, Peripheral Venous Disease, Type II Diabetes, Type II Diabetes, Osteoarthritis, Neuropathy, Osteoarthritis, Neuropathy, Received Chemotherapy Received Chemotherapy Date Acquired: 09/12/2018 10/02/2018 N/A Weeks of Treatment: 4 1 N/A Wound Status: Open Open N/A Wound Recurrence: Yes No N/A Measurements L x W x D 1.8x1x0.1 3x3.3x0.1 N/A (cm) Area (cm) : 1.414 7.775 N/A Volume (cm) : 0.141 0.778 N/A % Reduction in Area: 50.00% 50.50% N/A % Reduction in Volume: 50.20% 50.50% N/A Classification: Grade 1 Grade 2 N/A Exudate Amount: Medium Large N/A Exudate Type: Serous Serous N/A Exudate Color: amber amber N/A Adam Merritt, Adam Merritt ([id number]) Wound Margin: Flat and Intact Flat and Intact N/A Granulation Amount: Large (67-100%) Medium (34-66%) N/A Granulation Quality: Pink Pink N/A Necrotic Amount: None Present (0%) Small (1-33%) N/A Exposed Structures: Fascia: No Fat Layer (Subcutaneous N/A Fat Layer (Subcutaneous Tissue) Exposed: Yes Tissue) Exposed: No Fascia: No Tendon: No Tendon: No Muscle:  No Muscle: No Joint: No Joint: No Bone: No Bone: No Limited to Skin Breakdown Epithelialization: Medium (34-66%) Large (67-100%) N/A Periwound Skin Texture: Excoriation: No No Abnormalities Noted N/A Induration: No Callus: No Crepitus: No Rash: No Scarring: No Periwound Skin Moisture: Maceration: Yes Maceration: Yes N/A Dry/Scaly: No Periwound Skin Color: Hemosiderin Staining: Yes No Abnormalities Noted N/A Atrophie Blanche: No Cyanosis: No Ecchymosis: No Erythema: No Mottled: No Pallor: No Rubor: No Temperature: No Abnormality N/A N/A Tenderness on Palpation: No No N/A Treatment Notes Electronic Signature(s) Signed: 10/17/2018 4:49:14 PM By: Gretta Cool, BSN, RN, CWS, Kim RN, BSN Entered By: Gretta Cool, BSN, RN, CWS, Kim on 10/17/2018 13:07:24 Adam Merritt ([id number]) -------------------------------------------------------------------------------- Adam Merritt Details Patient Name: Adam Merritt Date of Service: 10/17/2018 12:30 PM Medical Record Number: [id number] Patient Account Number: [id number] Date of Birth/Sex: 1946-04-23 (73 y.o. M) Treating RN: Cornell Barman Primary Care Jenesa Foresta: [name] Other Clinician: Referring Sydne Krahl: [name] Treating Quinnlan Abruzzo/Extender: Melburn Hake, [name]: 69 Active Inactive Abuse / Safety / Falls / Self Care Management Nursing Diagnoses: Potential for falls Goals: Patient will remain injury free related to falls Date Initiated: 04/09/2018 Target Resolution Date: 10/05/2018 Goal Status: Active Interventions: Assess fall risk on admission and as needed Notes: Venous Leg Ulcer Nursing Diagnoses: Actual venous Insuffiency (use after diagnosis is confirmed) Goals: Patient will maintain optimal edema control Date Initiated: 04/09/2018 Target Resolution Date: 10/05/2018 Goal Status: Active Interventions: Assess peripheral edema status every visit. Compression as ordered Provide education on venous  insufficiency Notes: Electronic Signature(s) Signed: 10/17/2018 4:49:14 PM By: Gretta Cool, BSN, RN, CWS, Kim RN, BSN Entered By: Gretta Cool, BSN, RN, CWS, Kim on 10/17/2018 13:07:12 Adam Merritt ([id number]) -------------------------------------------------------------------------------- Pain Assessment Details Patient Name: Adam Merritt Date of Service: 10/17/2018 12:30 PM Medical Record Number: [id number] Patient Account Number: [id number] Date of Birth/Sex: 08/11/1945 (73 y.o. M) Treating RN: Montey Hora Primary Care Damonique Brunelle: [name] Other Clinician: Referring Rickie Gutierres: [name] Treating Tipton Ballow/Extender: Melburn Hake, [name]: 71 Active Problems Location of Pain Severity and Description of Pain Patient Has Paino No Site Locations Pain Management and Medication Current Pain Management: Electronic Signature(s) Signed: 10/17/2018 1:37:45 PM By: Montey Hora Entered By: Montey Hora on 10/17/2018 12:40:56 Adam Merritt ([id number]) -------------------------------------------------------------------------------- Patient/Caregiver Education Details Patient Name: Adam Merritt, GOYNE.  Date of Service: 10/17/2018 12:30 PM Medical Record Number: [id number] Patient Account Number: [id number] Date of Birth/Gender: [birth year] (73 y.o. M) Treating RN: Cornell Barman Primary Care Physician: [name] Other Clinician: Referring Physician: [name] Treating Physician/Extender: Sharalyn Ink in Treatment: 75 Education Assessment Education Provided To: Patient and Caregiver Education Topics Provided Wound/Skin Impairment: Handouts: Caring for Your Ulcer, Other: velcro wraps and compression wraps Methods: Demonstration, Explain/Verbal Responses: State content correctly Electronic Signature(s) Signed: 10/17/2018 4:49:14 PM By: Gretta Cool, BSN, RN, CWS, Kim RN, BSN Entered By: Gretta Cool, BSN, RN, CWS, Kim on 10/17/2018 13:10:16 Adam Merritt  ([id number]) -------------------------------------------------------------------------------- Wound Assessment Details Patient Name: Adam Merritt Date of Service: 10/17/2018 12:30 PM Medical Record Number: [id number] Patient Account Number: [id number] Date of Birth/Sex: 09-22-1945 (73 y.o. M) Treating RN: Montey Hora Primary Care Perle Brickhouse: [name] Other Clinician: Referring Alessandra Sawdey: [name] Treating Arsenio Schnorr/Extender: Melburn Hake, [name]: 27 Wound Status Wound Number: 19R Primary Diabetic Wound/Ulcer of the Lower Extremity Etiology: Wound Location: Left Lower Leg - Posterior, Distal Wound Open Wounding Event: Gradually Appeared Status: Date Acquired: 09/12/2018 Comorbid Anemia, Lymphedema, Chronic Obstructive Weeks Of Treatment: 4 History: Pulmonary Disease (COPD), Sleep Apnea, Clustered Wound: No Congestive Heart Failure, Coronary Artery Disease, Hypertension, Peripheral Venous Disease, Type II Diabetes, Osteoarthritis, Neuropathy, Received Chemotherapy Photos Wound Measurements Length: (cm) 1.8 Width: (cm) 1 Depth: (cm) 0.1 Area: (cm) 1.414 Volume: (cm) 0.141 % Reduction in Area: 50% % Reduction in Volume: 50.2% Epithelialization: Medium (34-66%) Tunneling: No Undermining: No Wound Description Classification: Grade 1 Foul O Wound Margin: Flat and Intact Slough Exudate Amount: Medium Exudate Type: Serous Exudate Color: amber dor After Cleansing: No /Fibrino No Wound Bed Granulation Amount: Large (67-100%) Exposed Structure Granulation Quality: Pink Fascia Exposed: No Necrotic Amount: None Present (0%) Fat Layer (Subcutaneous Tissue) Exposed: No Tendon Exposed: No Muscle Exposed: No Joint Exposed: No Bone Exposed: No Adam Merritt, Adam Merritt ([id number]) Limited to Skin Breakdown Periwound Skin Texture Texture Color No Abnormalities Noted: No No Abnormalities Noted: No Callus: No Atrophie Blanche: No Crepitus: No Cyanosis:  No Excoriation: No Ecchymosis: No Induration: No Erythema: No Rash: No Hemosiderin Staining: Yes Scarring: No Mottled: No Pallor: No Moisture Rubor: No No Abnormalities Noted: No Dry / Scaly: No Temperature / Pain Maceration: Yes Temperature: No Abnormality Treatment Notes Wound #19R (Left, Distal, Posterior Lower Leg) Notes Patients own compression wraps Electronic Signature(s) Signed: 10/17/2018 1:37:45 PM By: Montey Hora Entered By: Montey Hora on 10/17/2018 12:56:52 Adam Merritt ([id number]) -------------------------------------------------------------------------------- Wound Assessment Details Patient Name: Adam Merritt Date of Service: 10/17/2018 12:30 PM Medical Record Number: [id number] Patient Account Number: [id number] Date of Birth/Sex: 1946-03-15 (73 y.o. M) Treating RN: Montey Hora Primary Care Elayne Gruver: [name] Other Clinician: Referring Fern Asmar: [name] Treating Lisa-Marie Rueger/Extender: STONE III, [name]: 27 Wound Status Wound Number: 20 Primary Diabetic Wound/Ulcer of the Lower Extremity Etiology: Wound Location: Right Lower Leg - Posterior Wound Open Wounding Event: Gradually Appeared Status: Date Acquired: 10/02/2018 Comorbid Anemia, Lymphedema, Chronic Obstructive Weeks Of Treatment: 1 History: Pulmonary Disease (COPD), Sleep Apnea, Clustered Wound: No Congestive Heart Failure, Coronary Artery Disease, Hypertension, Peripheral Venous Disease, Type II Diabetes, Osteoarthritis, Neuropathy, Received Chemotherapy Photos Wound Measurements Length: (cm) 3 % Reduction i Width: (cm) 3.3 % Reduction i Depth: (cm) 0.1 Epithelializa Area: (cm) 7.775 Tunneling: Volume: (cm) 0.778 Undermining: n Area: 50.5% n Volume: 50.5% tion: Large (67-100%) No No Wound Description Classification: Grade 2 Foul Odor Aft Wound Margin: Flat and Intact Slough/Fibrin Exudate  Amount: Large Exudate Type: Serous Exudate Color:  amber er Cleansing: No o Yes Wound Bed Granulation Amount: Medium (34-66%) Exposed Structure Granulation Quality: Pink Fascia Exposed: No Necrotic Amount: Small (1-33%) Fat Layer (Subcutaneous Tissue) Exposed: Yes Necrotic Quality: Adherent Slough Tendon Exposed: No Muscle Exposed: No Joint Exposed: No Bone Exposed: No Adam Merritt, Adam Merritt ([id number]) Periwound Skin Texture Texture Color No Abnormalities Noted: No No Abnormalities Noted: No Moisture No Abnormalities Noted: No Maceration: Yes Treatment Notes Wound #20 (Right, Posterior Lower Leg) Notes Patients own compression wraps Electronic Signature(s) Signed: 10/17/2018 1:37:45 PM By: Montey Hora Entered By: Montey Hora on 10/17/2018 12:57:18 Adam Merritt ([id number]) -------------------------------------------------------------------------------- Vitals Details Patient Name: Adam Merritt Date of Service: 10/17/2018 12:30 PM Medical Record Number: [id number] Patient Account Number: [id number] Date of Birth/Sex: 1945-12-03 (73 y.o. M) Treating RN: Montey Hora Primary Care Saralynn Langhorst: [name] Other Clinician: Referring Marabella Popiel: [name] Treating Alana Dayton/Extender: Melburn Hake, [name]: 27 Vital Signs Time Taken: 12:41 Temperature (F): 98.3 Height (in): 66 Pulse (bpm): 81 Weight (lbs): 323 Respiratory Rate (breaths/min): 18 Body Mass Index (BMI): 52.1 Blood Pressure (mmHg): 141/81 Reference Range: 80 - 120 mg / dl Electronic Signature(s) Signed: 10/17/2018 1:37:45 PM By: Montey Hora Entered By: Montey Hora on 10/17/2018 12:42:18

## 2018-10-17 NOTE — Progress Notes (Signed)
YOSHITO, GAZA ([id number]) Visit Report for 10/17/2018 Chief Complaint Document Details Patient Name: Adam Merritt, Adam Merritt Date of Service: 10/17/2018 12:30 PM Medical Record Number: [id number] Patient Account Number: [id number] Date of Birth/Sex: 1946/02/14 (73 y.o. M) Treating RN: Cornell Barman Primary Care Provider: [name] Other Clinician: Referring Provider: [name] Treating Provider/Extender: Melburn Hake, [name]: 23 Information Obtained from: Patient Chief Complaint he is here for evaluation of bilateral lower extremity lymphedema right right toe and left LE ulcers Electronic Signature(s) Signed: 10/17/2018 5:10:17 PM By: Worthy Keeler PA-C Entered By: Worthy Keeler on 10/17/2018 12:59:26 Adam Merritt ([id number]) -------------------------------------------------------------------------------- HPI Details Patient Name: Adam Merritt Date of Service: 10/17/2018 12:30 PM Medical Record Number: [id number] Patient Account Number: [id number] Date of Birth/Sex: [birth year] (73 y.o. M) Treating RN: Cornell Barman Primary Care Provider: [name] Other Clinician: Referring Provider: [name] Treating Provider/Extender: Melburn Hake, [name]: 49 History of Present Illness HPI Description: 10/18/17-He is here for initial evaluation of bilateral lower extremity ulcerations in the presence of venous insufficiency and lymphedema. He has been seen by vascular medicine in the past, Dr. Lucky Cowboy, last seen in 2016. He does have a history of abnormal ABIs, which is to be expected given his lymphedema and venous insufficiency. According to Epic, it appears that all attempts for arterial evaluation and/or angiography were not follow through with by patient. He does have a history of being seen in lymphedema clinic in 2018, stopped going approximately 6 months ago stating "it didn't do any good". He does not have lymphedema pumps, he does not have custom fit compression  wrap/stockings. He is diabetic and his recent A1c last month was 7.6. He admits to chronic bilateral lower extremity pain, no change in pain since blister and ulceration development. He is currently being treated with Levaquin for bronchitis. He has home health and we will continue. 10/25/17-He is here in follow-up evaluation for bilateral lower extremity ulcerationssubtle he remains on Levaquin for bronchitis. Right lower extremity with no evidence of drainage or ulceration, persistent left lower extremity ulceration. He states that home health has not been out since his appointment. He went to [facility] on Tuesday, studies revealed: RIGHT ABI 0.9, TBI 0.6 LEFT ABI 1.1, TBI 0.6 with triphasic flow bilaterally. We will continue his same treatment plan. He has been educated on compression therapy and need for elevation. He will benefit from lymphedema pumps 11/01/17-He is here in follow-up evaluation for left lower extremity ulcer. The right lower extremity remains healed. He has home health services, but they have not been out to see the patient for 2-3 weeks. He states it home health physical therapy changed his dressing yesterday after therapy; he placed Ace wrap compression. We are still waiting for lymphedema pumps, reordered d/t need for company change. 11/08/17-He is here in follow-up evaluation for left lower extremity ulcer. It is improved. Edema is significantly improved with compression therapy. We will continue with same treatment plan and he will follow-up next week. No word regarding lymphedema pumps 11/15/17-He is here in follow-up evaluation for left lower extremity ulcer. He is healed and will be discharged from wound care services. I have reached out to [facility] regarding his lymphedema pumps. They have been unable to reach the patient; the contact number they had with the patient's wife's cell phone and she has not answered any unrecognized calls. Contact  should be made today, trial planned for next week; [facility] will continue to  follow 11/27/17 on evaluation today patient has multiple blistered areas over the right lower extremity his left lower extremity appears to be doing okay. These blistered areas show signs of no infection which is great news. With that being said he did have some necrotic skin overlying which was mechanically debrided away with saline and gauze today without complication. Overall post debridement the wounds appear to be doing better but in general his swelling seems to be increased. This is obviously not good news. I think this is what has given rise to the blisters. 12/04/17 on evaluation today patient presents for follow-up concerning his bilateral lower extremity edema in the right lower extremity ulcers. He has been tolerating the dressing changes without complication. With that being said he has had no real issues with the wraps which is also good news. Overall I'm pleased with the progress he's been making. 12/11/17 on evaluation today patient appears to be doing rather well in regard to his right lateral lower extremity ulcer. He's been tolerating the dressing changes without complication. Fortunately there does not appear to be any evidence of infection at this time. Overall I'm pleased with the progress that is being made. Unfortunately he has been in the hospital due to having what sounds to be a stomach virus/flu fortunately that is starting to get better. 12/18/17 on evaluation today patient actually appears to be doing very well in regard to his bilateral lower extremities the swelling is under fairly good control his lymphedema pumps are still not up and running quite yet. With that being said he does have several areas of opening noted as far as wounds are concerned mainly over the left lower extremity. With that being said I do believe once he gets lymphedema pumps this would least help you mention some the  fluid and preventing this from occurring. Hopefully that will be set up soon sleeves are Artie in place at his home he just waiting for the machine. 12/25/17 on evaluation today patient actually appears to be doing excellent in fact all of his ulcers appear to have resolved his TREVAUGHN, SCHEAR. ([id number]) legs appear very well. I do think he needs compression stockings we have discussed this and they are actually going to go to [place] today to elastic therapy to get this fitted for him. I think that is definitely a good thing to do. Readmission: 04/09/18 upon evaluation today this patient is seen for readmission due to bilateral lower extremity lymphedema. He has significant swelling of his extremities especially on the left although the right is also swollen he has weeping from both sides. There are no obvious open wounds at this point. Fortunately he has been doing fairly well for quite a bit of time since I last saw him. Nonetheless unfortunately this seems to have reopened and is giving quite a bit of trouble. He states this began about a week ago when he first called [place] to get in to be seen. No fevers, chills, nausea, or vomiting noted at this time. He has not been using his lymphedema pumps due to the fact that they won't fit on his leg at this point likewise is also not been using his compression for essentially the same reason. 04/16/18 upon evaluation today patient actually appears to be doing a little better in regard to the fluid in his bilateral lower extremities. With that being said he's had three falls since I saw him last week. He also states that he's been feeling very poorly. I was  concerned last week and feel like that the concern is still there as far as the congestion in his chest is concerned he seems to be breathing about the same as last week but again he states he's very weak he's not even able to walk further than from the chair to the door. His wife had to buy a  wheelchair just to be able to get them out of the house to get to the appointment today. This has me very concerned. 04/23/18 on evaluation today patient actually appears to be doing much better than last week's evaluation. At that time actually had to transport him to the ER via EMS and he subsequently was admitted for acute pulmonary edema, acute renal failure, and acute congestive heart failure. Fortunately he is doing much better. Apparently they did dialyze him and were able to take off roughly 35 pounds of fluid. Nonetheless he is feeling much better both in regard to his breathing and he's able to get around much better at this time compared to previous. Overall I'm very happy with how things are at this time. There does not appear to be any evidence of infection currently. No fevers, chills, nausea, or vomiting noted at this time. 04/30/2018 patient seen today for follow-up and management of bilateral lower extremity lymphedema. He did express being more sad today than usual due to the recent loss of his dog. He states that he has been compliant with using the lymphedema pumps. However he does admit a minute over the last 2-3 days he has not been using the pumps due to the recent loss of his dog. At this time there is no drainage or open wounds to his lower extremities. The left leg edema is measuring smaller today. Still has a significant amount of edema on bilateral lower extremities With dry flaky skin. He will be referred to the lymphedema clinic for further management. Will continue 3 layer compression wraps and follow-up in 1-2 weeks.Denies any pain, fever, chairs recently. No recent falls or injuries reported during this visit. 05/07/18 on evaluation today patient actually appears to be doing very well in regard to his lower extremities in general all things considered. With that being said he is having some pain in the legs just due to the amount of swelling. He does have an area  where he had a blister on the left lateral lower extremity this is open at this point other than that there's nothing else weeping at this time. 05/14/18 on evaluation today patient actually appears to be doing excellent all things considered in regard to his lower extremities. He still has a couple areas of weeping on each leg which has continued to be the issue for him. He does have an appointment with the lymphedema clinic although this isn't until February 2020. That was the earliest they had. In the meantime he has continued to tolerate the compression wraps without complication. 05/28/18 on evaluation today patient actually appears to be doing more poorly in regard to his left lower extremity where he has a wound open at this point. He also had a fall where he subsequently injured his right great toe which has led to an open wound at the site unfortunately. He has been tolerating the dressing changes without complication in general as far as the wraps are concerned that he has not been putting any dressing on the left 1st toe ulcer site. 06/11/18 on evaluation today patient appears to be doing much worse in regard to his  bilateral lower extremity ulcers. He has been tolerating the dressing changes without complication although his legs have not been wrapped more recently. Overall I am not very pleased with the way his legs appear. I do believe he needs to be back in compression wraps he still has not received his compression wraps from the [facility] hospital as of yet. 06/18/18 on evaluation today patient actually appears to be doing significantly better than last time I saw him. He has been tolerating the compression wraps without complication in the circumferential ulcers especially appear to be doing much better. His toe ulcer on the right in regard to the great toe is better although not as good as the legs in my pinion. No fevers chills noted SAVALAS, MONJE ([id number]) 07/02/18 on evaluation  today patient appears to be doing much better in regard to his lower extremity ulcers. Unfortunately since I last saw him he's had the distal portion of his right great toe if he dated it sounds as if this actually went downhill very quickly. I had only seen him a few days prior and the toe did not appear to be infected at that point subsequently became infected very rapidly and it was decided by the surgeon that the distal portion of the toe needed to be removed. The patient seems to be doing well in this regard he tells me. With that being said his lower extremities are doing better from the standpoint of the wounds although he is significantly swollen at this point. 07/09/18 on evaluation today patient appears to be doing better in regard to the wounds on his lower extremities. In fact everything is almost completely healed he is just a small area on the left posterior lower extremity that is open at this point. 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 With that being said he needs some kind of compression it does sound like he may not be wearing his compression, that is the wraps, during the entire time between when he's here visit to visit. Apparently his wife took the current one off because it began to "fall apart". 07/16/18 on evaluation today patient appears to be extremely swollen especially in regard to his left lower extremity unfortunately. He also has a new skin tear over the left lower extremity and there's a smaller area on the right lower extremity as well. Unfortunately this seems to be due in part to blistering and fluid buildup in his leg. He did get the reduction wraps that were ordered by the [facility] hospital for him to go to lymphedema clinic. With that being said his wounds on the legs have not healed  to the point to where they would likely accept them as a patient lymphedema clinic currently. We need to try to get this to heal. With that being said he's been taking his wraps off which is not doing him any favors at this point. In fact this is probably quite counterproductive compared to what needs to occur. We will likely need to increase to a four layer compression wrap and continue to also utilize elevation and he has to keep the wraps on not take them off as he's been doing currently hasn't had a wrap on since Saturday. 07/23/18 on evaluation today patient appears to be doing much better in regard to his bilateral lower extremities.  In fact his left lower extremity which was the largest is actually 15 cm smaller today compared to what it was last time he was here in our clinic. This is obviously good news after just one week. Nonetheless the differences he actually kept the wraps on during the entire week this time. That's not typical for him. I do believe he understands a little bit better now the severity of the situation and why it's important for him to keep these wraps on. 07/30/18 on evaluation today patient actually appears to be doing rather well in regard to his lower extremities. His legs are much smaller than they have been in the past and he actually has only one very small rudder superficial region remaining that is not closed on the left lateral/posterior lower extremity even this is almost completely close. I do believe likely next week he will be healed without any complications. I do think we need to continue the wraps however this seems to be beneficial for him. I also think it may be a good time for [place] to go ahead and see about getting the appointment with the lymphedema clinic which is supposed to be made for him in order to keep this moving along and hopefully get them into compression wraps that will in the end help him to remain healed. 08/06/18 on evaluation today patient  actually appears to be doing very well in regard as bilateral lower extremities. In fact his wounds appear to be completely healed at this time. 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 He is in the process of getting appointment with the lymphedema clinic we have made this referral were just waiting to hear back on the schedule time. We need to follow up on that today as well. 08/13/18 on evaluation today patient actually appears to be doing very well in regard to his bilateral lower extremities there are no open wounds at this point. We are gonna go ahead and see about ordering the Velcro compression wraps for him had a discussion with them about [place] doing it versus the [place] they feel like they can definitely afford going ahead and get the wraps themselves and they would prefer to try to avoid having to go to the lymphedema clinic if it all possible which I completely understand. As long as he has good compression I'm okay either way. 3/17//20 on evaluation today patient actually appears to be doing well in regard to his bilateral lower extremity ulcers. He has been tolerating the dressing changes without complication specifically the compression wraps. Overall is had no issues my fingers finding that I see at this point is that he is having trouble with constipation. He tells me he has not been able to go to the bathroom for about six days. He's taken to over-the-counter oral laxatives unfortunately this is not helping. He has not contacted his doctor. 08/27/18 on evaluation today patient appears to be doing fairly well in regard to his lower extremities at this point. There does not appear to be any new altars is swelling is very well controlled. We are still waiting for his Velcro compression wraps to arrive that should be sometime in the next week is Artie sent the check for these. 09/03/18 on evaluation today patient appears to be doing  excellent in regard to his bilateral lower extremities which he shows MITHRAN, STRIKE. ([id number]) no signs of wound openings in regard to this point. He does have  his Velcro compression wraps which did arrive in the mail since I saw him last week. Overall he is doing excellent in my pinion. 09/13/18 on evaluation today patient appears to be doing very well currently in regard to the overall appearance of his bilateral lower extremities although he's a little bit more swollen than last time we saw him. At that point he been discharged without any open wounds. Nonetheless he has a small open wound on the posterior left lower extremity with some evidence of cellulitis noted as well. Fortunately I feel like he has made good progress overall with regard to his lower extremities from were things used to be. 09/20/18 on evaluation today patient actually appears to be doing much better. The erythematous lower extremity is improving wound itself which is still open appears to be doing much better as far as but appearance as well as pain is concerned overall very pleased in this regard. There's no signs of active infection at this time. 09/27/18 on evaluation today patient's wounds on the lower extremity actually appear to be doing fairly well at this time which is good news. There is no evidence of active infection currently and again is just as left lower extremity were there any wounds at this point anyway. I believe they may be completely healed but again I'm not 100% sure based on evaluation today. I think one more week of observation would likely be a good idea. 10/04/18 on evaluation today patient actually appears to be doing excellent in regard to the left lower extremity which actually appears to be completely healed as of today. Unfortunately he's been having issues with his right lower extremity have a new wound that has opened. Fortunately there's no evidence of active infection at this time which is  good news. 10/10/18 upon evaluation today patient actually appears to be doing a little bit worse with the new open area on his right posterior lower extremity. He's been tolerating the dressing changes without complication. Right now we been using his compression wraps although I think we may need to switch back to actually performing bilateral compression wraps are in the clinic. No fevers, chills, nausea, or vomiting noted at this time. 10/17/18 on evaluation today patient actually appears to be doing quite well in regard to his bilateral lower extremity ulcers. He has been tolerating the reps without complication although he would prefer not to rewrap his legs as of today. Fortunately there's no signs of active infection which is good news. No fevers, chills, nausea, or vomiting noted at this time. Electronic Signature(s) Signed: 10/17/2018 5:10:17 PM By: Worthy Keeler PA-C Entered By: Worthy Keeler on 10/17/2018 14:31:31 Adam Merritt ([id number]) -------------------------------------------------------------------------------- Physical Exam Details Patient Name: Adam Merritt Date of Service: 10/17/2018 12:30 PM Medical Record Number: [id number] Patient Account Number: [id number] Date of Birth/Sex: [birth year] (73 y.o. M) Treating RN: Cornell Barman Primary Care Provider: [name] Other Clinician: Referring Provider: [name] Treating Provider/Extender: Melburn Hake, [name]: 47 Constitutional Obese and well-hydrated in no acute distress. Respiratory normal breathing without difficulty. clear to auscultation bilaterally. Cardiovascular regular rate and rhythm with normal S1, S2. Psychiatric this patient is able to make decisions and demonstrates good insight into disease process. Alert and Oriented x 3. pleasant and cooperative. Notes Patient does have his Velcro compression wraps that he prefers with him today which is good news. Subsequently I do believe that  his wounds are very close to complete closure there's just very tiny  openings at the site at this point which is also good news. Again if you will wear them I think that switching over to his compression stockings would be okay. He states that he definitely will wear them as directed as well as use his compression pumps. Electronic Signature(s) Signed: 10/17/2018 5:10:17 PM By: Worthy Keeler PA-C Entered By: Worthy Keeler on 10/17/2018 14:32:02 Adam Merritt ([id number]) -------------------------------------------------------------------------------- Physician Orders Details Patient Name: Adam Merritt Date of Service: 10/17/2018 12:30 PM Medical Record Number: [id number] Patient Account Number: [id number] Date of Birth/Sex: August 29, 1945 (73 y.o. 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 - Bilateral velcro wraps. o Elevate legs to the level of the heart and pump ankles as often as possible o Compression Pump: Use compression pump on left lower extremity for 30 minutes, twice daily. o Compression Pump: Use compression pump on right lower extremity for 30 minutes, twice daily. Wound #20 Right,Posterior Lower Leg o Patient to wear own Velcro compression garment. - Bilateral velcro wraps. o Elevate legs to the level of the heart and pump  ankles as often as possible o Compression Pump: Use compression pump on left lower extremity for 30 minutes, twice daily. o Compression Pump: Use compression pump on right lower extremity for 30 minutes, twice daily. Electronic Signature(s) Signed: 10/17/2018 4:49:14 PM By: Gretta Cool, BSN, RN, CWS, Kim RN, BSN Signed: 10/17/2018 5:10:17 PM By: Worthy Keeler PA-C Entered By: Gretta Cool BSN, RN, CWS, Kim on 10/17/2018 13:08:51 KYREESE, CHIO ([id number]) -------------------------------------------------------------------------------- Problem List Details Patient Name: YERAY, TOMAS Date of Service: 10/17/2018 12:30 PM Medical Record Number: [id number] Patient Account Number: [id number] Date of Birth/Sex: Jun 03, 1946 (73 y.o. M) Treating RN: Cornell Barman Primary Care Provider: [name] Other Clinician: Referring Provider: [name] Treating Provider/Extender: Melburn Hake, [name]: 73 Active Problems ICD-10 Evaluated Encounter Code Description Active Date Today Diagnosis I89.0 Lymphedema, not elsewhere classified 04/09/2018 No Yes L97.512 Non-pressure chronic ulcer of other part of right foot with fat 05/28/2018 No Yes layer exposed L97.822 Non-pressure chronic ulcer of other part of left lower leg with 05/28/2018 No Yes fat layer exposed L98.492 Non-pressure chronic ulcer of skin of other sites with fat layer 06/11/2018 No Yes exposed E66.9 Obesity, unspecified 04/09/2018 No Yes Inactive Problems Resolved Problems ICD-10 Code Description Active Date Resolved Date L03.115 Cellulitis of right lower limb 04/09/2018 04/09/2018 L03.116 Cellulitis of left lower limb 04/09/2018 04/09/2018 Electronic Signature(s) Signed: 10/17/2018 5:10:17 PM By: Worthy Keeler PA-C Entered By: Worthy Keeler on 10/17/2018 12:59:20 Adam Merritt ([id number]) COURY, GRIEGER ([id number]) -------------------------------------------------------------------------------- Progress Note Details Patient  Name: Adam Merritt Date of Service: 10/17/2018 12:30 PM Medical Record Number: [id number] Patient Account Number: [id number] Date of Birth/Sex: Aug 09, 1945 (73 y.o. M) Treating RN: Cornell Barman Primary Care Provider: [name] Other Clinician: Referring Provider: [name] Treating Provider/Extender: Melburn Hake, [name]: 33 Subjective Chief Complaint Information obtained from Patient he is here  for evaluation of bilateral lower extremity lymphedema right right toe and left LE ulcers History of Present Illness (HPI) 10/18/17-He is here for initial evaluation of bilateral lower extremity ulcerations in the presence of venous insufficiency and lymphedema. He has been seen by vascular medicine in the past, Dr. Lucky Cowboy, last seen in 2016. He does have a history of abnormal ABIs, which is to be expected given his lymphedema and venous insufficiency. According to Epic, it appears that all attempts for arterial evaluation and/or angiography were not follow through with by patient. He does have a history of being seen in lymphedema clinic in 2018, stopped going approximately 6 months ago stating "it didn't do any good". He does not have lymphedema pumps, he does not have custom fit compression wrap/stockings. He is diabetic and his recent A1c last month was 7.6. He admits to chronic bilateral lower extremity pain, no change in pain since blister and ulceration development. He is currently being treated with Levaquin for bronchitis. He has home health and we will continue. 10/25/17-He is here in follow-up evaluation for bilateral lower extremity ulcerationssubtle he remains on Levaquin for bronchitis. Right lower extremity with no evidence of drainage or ulceration, persistent left lower extremity ulceration. He states that home health has not been out since his appointment. He went to [facility] on Tuesday, studies revealed: RIGHT ABI 0.9, TBI 0.6 LEFT ABI 1.1, TBI 0.6 with  triphasic flow bilaterally. We will continue his same treatment plan. He has been educated on compression therapy and need for elevation. He will benefit from lymphedema pumps 11/01/17-He is here in follow-up evaluation for left lower extremity ulcer. The right lower extremity remains healed. He has home health services, but they have not been out to see the patient for 2-3 weeks. He states it home health physical therapy changed his dressing yesterday after therapy; he placed Ace wrap compression. We are still waiting for lymphedema pumps, reordered d/t need for company change. 11/08/17-He is here in follow-up evaluation for left lower extremity ulcer. It is improved. Edema is significantly improved with compression therapy. We will continue with same treatment plan and he will follow-up next week. No word regarding lymphedema pumps 11/15/17-He is here in follow-up evaluation for left lower extremity ulcer. He is healed and will be discharged from wound care services. I have reached out to [facility] regarding his lymphedema pumps. They have been unable to reach the patient; the contact number they had with the patient's wife's cell phone and she has not answered any unrecognized calls. Contact should be made today, trial planned for next week; [facility] will continue to follow 11/27/17 on evaluation today patient has multiple blistered areas over the right lower extremity his left lower extremity appears to be doing okay. These blistered areas show signs of no infection which is great news. With that being said he did have some necrotic skin overlying which was mechanically debrided away with saline and gauze today without complication. Overall post debridement the wounds appear to be doing better but in general his swelling seems to be increased. This is obviously not good news. I think this is what has given rise to the blisters. 12/04/17 on evaluation today patient presents for  follow-up concerning his bilateral lower extremity edema in the right lower extremity ulcers. He has been tolerating the dressing changes without complication. With that being said he has had no real issues with the wraps which is also good news. Overall I'm pleased with the progress  he's been making. 12/11/17 on evaluation today patient appears to be doing rather well in regard to his right lateral lower extremity ulcer. He's been tolerating the dressing changes without complication. Fortunately there does not appear to be any evidence of infection at this time. Overall I'm pleased with the progress that is being made. Unfortunately he has been in the hospital due to having what sounds to be a stomach virus/flu fortunately that is starting to get better. 12/18/17 on evaluation today patient actually appears to be doing very well in regard to his bilateral lower extremities the ANSEL, FERRALL. ([id number]) swelling is under fairly good control his lymphedema pumps are still not up and running quite yet. With that being said he does have several areas of opening noted as far as wounds are concerned mainly over the left lower extremity. With that being said I do believe once he gets lymphedema pumps this would least help you mention some the fluid and preventing this from occurring. Hopefully that will be set up soon sleeves are Artie in place at his home he just waiting for the machine. 12/25/17 on evaluation today patient actually appears to be doing excellent in fact all of his ulcers appear to have resolved his legs appear very well. I do think he needs compression stockings we have discussed this and they are actually going to go to [place] today to elastic therapy to get this fitted for him. I think that is definitely a good thing to do. Readmission: 04/09/18 upon evaluation today this patient is seen for readmission due to bilateral lower extremity lymphedema. He has significant swelling of his  extremities especially on the left although the right is also swollen he has weeping from both sides. There are no obvious open wounds at this point. Fortunately he has been doing fairly well for quite a bit of time since I last saw him. Nonetheless unfortunately this seems to have reopened and is giving quite a bit of trouble. He states this began about a week ago when he first called [place] to get in to be seen. No fevers, chills, nausea, or vomiting noted at this time. He has not been using his lymphedema pumps due to the fact that they won't fit on his leg at this point likewise is also not been using his compression for essentially the same reason. 04/16/18 upon evaluation today patient actually appears to be doing a little better in regard to the fluid in his bilateral lower extremities. With that being said he's had three falls since I saw him last week. He also states that he's been feeling very poorly. I was concerned last week and feel like that the concern is still there as far as the congestion in his chest is concerned he seems to be breathing about the same as last week but again he states he's very weak he's not even able to walk further than from the chair to the door. His wife had to buy a wheelchair just to be able to get them out of the house to get to the appointment today. This has me very concerned. 04/23/18 on evaluation today patient actually appears to be doing much better than last week's evaluation. At that time actually had to transport him to the ER via EMS and he subsequently was admitted for acute pulmonary edema, acute renal failure, and acute congestive heart failure. Fortunately he is doing much better. Apparently they did dialyze him and were able to take off roughly 35  pounds of fluid. Nonetheless he is feeling much better both in regard to his breathing and he's able to get around much better at this time compared to previous. Overall I'm very happy with how things  are at this time. There does not appear to be any evidence of infection currently. No fevers, chills, nausea, or vomiting noted at this time. 04/30/2018 patient seen today for follow-up and management of bilateral lower extremity lymphedema. He did express being more sad today than usual due to the recent loss of his dog. He states that he has been compliant with using the lymphedema pumps. However he does admit a minute over the last 2-3 days he has not been using the pumps due to the recent loss of his dog. At this time there is no drainage or open wounds to his lower extremities. The left leg edema is measuring smaller today. Still has a significant amount of edema on bilateral lower extremities With dry flaky skin. He will be referred to the lymphedema clinic for further management. Will continue 3 layer compression wraps and follow-up in 1-2 weeks.Denies any pain, fever, chairs recently. No recent falls or injuries reported during this visit. 05/07/18 on evaluation today patient actually appears to be doing very well in regard to his lower extremities in general all things considered. With that being said he is having some pain in the legs just due to the amount of swelling. He does have an area where he had a blister on the left lateral lower extremity this is open at this point other than that there's nothing else weeping at this time. 05/14/18 on evaluation today patient actually appears to be doing excellent all things considered in regard to his lower extremities. He still has a couple areas of weeping on each leg which has continued to be the issue for him. He does have an appointment with the lymphedema clinic although this isn't until February 2020. That was the earliest they had. In the meantime he has continued to tolerate the compression wraps without complication. 05/28/18 on evaluation today patient actually appears to be doing more poorly in regard to his left lower extremity where  he has a wound open at this point. He also had a fall where he subsequently injured his right great toe which has led to an open wound at the site unfortunately. He has been tolerating the dressing changes without complication in general as far as the wraps are concerned that he has not been putting any dressing on the left 1st toe ulcer site. 06/11/18 on evaluation today patient appears to be doing much worse in regard to his bilateral lower extremity ulcers. He has been tolerating the dressing changes without complication although his legs have not been wrapped more recently. Overall I am not very pleased with the way his legs appear. I do believe he needs to be back in compression wraps he still has not received his compression wraps from the [facility] hospital as of yet. YIANNIS, TULLOCH ([id number]) 06/18/18 on evaluation today patient actually appears to be doing significantly better than last time I saw him. He has been tolerating the compression wraps without complication in the circumferential ulcers especially appear to be doing much better. His toe ulcer on the right in regard to the great toe is better although not as good as the legs in my pinion. No fevers chills noted 07/02/18 on evaluation today patient appears to be doing much better in regard to his lower extremity ulcers.  Unfortunately since I last saw him he's had the distal portion of his right great toe if he dated it sounds as if this actually went downhill very quickly. I had only seen him a few days prior and the toe did not appear to be infected at that point subsequently became infected very rapidly and it was decided by the surgeon that the distal portion of the toe needed to be removed. The patient seems to be doing well in this regard he tells me. With that being said his lower extremities are doing better from the standpoint of the wounds although he is significantly swollen at this point. 07/09/18 on evaluation today patient  appears to be doing better in regard to the wounds on his lower extremities. In fact everything is almost completely healed he is just a small area on the left posterior lower extremity that is open at this point. 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 With that being said he needs some kind of compression it does sound like he may not be wearing his compression, that is the wraps, during the entire time between when he's here visit to visit. Apparently his wife took the current one off because it began to "fall apart". 07/16/18 on evaluation today patient appears to be extremely swollen especially in regard to his left lower extremity unfortunately. He also has a new skin tear over the left lower extremity and there's a smaller area on the right lower extremity as well. Unfortunately this seems to be due in part to blistering and fluid buildup in his leg. He did get the reduction wraps that were ordered by the [facility] hospital for him to go to lymphedema clinic. With that being said his wounds on the legs have not healed to the point to where they would likely accept them as a patient lymphedema clinic currently. We need to try to get this to heal. With that being said he's been taking his wraps off which is not doing him any favors at this point. In fact this is probably quite counterproductive compared to what needs to occur. We will likely need to increase to a four layer compression wrap and continue to also utilize elevation and he has to keep the wraps on not take them off as he's been doing currently hasn't had a wrap on since Saturday. 07/23/18 on evaluation today patient appears to be doing much better in regard to his bilateral lower extremities. In fact his left lower extremity which was the largest is  actually 15 cm smaller today compared to what it was last time he was here in our clinic. This is obviously good news after just one week. Nonetheless the differences he actually kept the wraps on during the entire week this time. That's not typical for him. I do believe he understands a little bit better now the severity of the situation and why it's important for him to keep these wraps on. 07/30/18 on evaluation today patient actually appears to be doing rather well in regard to his lower extremities. His legs are much smaller than they have been in the past and he actually has only one very small rudder superficial region remaining that is not closed on the left lateral/posterior lower extremity even this is almost completely close. I do believe likely next week  he will be healed without any complications. I do think we need to continue the wraps however this seems to be beneficial for him. I also think it may be a good time for [place] to go ahead and see about getting the appointment with the lymphedema clinic which is supposed to be made for him in order to keep this moving along and hopefully get them into compression wraps that will in the end help him to remain healed. 08/06/18 on evaluation today patient actually appears to be doing very well in regard as bilateral lower extremities. In fact his wounds appear to be completely healed at this time. 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 He is in the process of getting appointment with the lymphedema clinic we have made this referral were just waiting to hear back on the schedule time. We need to follow up on that today as well. 08/13/18 on evaluation today patient actually appears to be doing very well in regard to his bilateral lower extremities there are no open wounds at this point. We are gonna go ahead and see about ordering the Velcro compression wraps for him had a discussion  with them about [place] doing it versus the [place] they feel like they can definitely afford going ahead and get the wraps themselves and they would prefer to try to avoid having to go to the lymphedema clinic if it all possible which I completely understand. As long as he has good compression I'm okay either way. 3/17//20 on evaluation today patient actually appears to be doing well in regard to his bilateral lower extremity ulcers. He has been tolerating the dressing changes without complication specifically the compression wraps. Overall is had no issues my fingers finding that I see at this point is that he is having trouble with constipation. He tells me he has not been able to go to the bathroom for about six days. He's taken to over-the-counter oral laxatives unfortunately this is not helping. He has not contacted his doctor. ADMIR, CANDELAS ([id number]) 08/27/18 on evaluation today patient appears to be doing fairly well in regard to his lower extremities at this point. There does not appear to be any new altars is swelling is very well controlled. We are still waiting for his Velcro compression wraps to arrive that should be sometime in the next week is Artie sent the check for these. 09/03/18 on evaluation today patient appears to be doing excellent in regard to his bilateral lower extremities which he shows no signs of wound openings in regard to this point. He does have his Velcro compression wraps which did arrive in the mail since I saw him last week. Overall he is doing excellent in my pinion. 09/13/18 on evaluation today patient appears to be doing very well currently in regard to the overall appearance of his bilateral lower extremities although he's a little bit more swollen than last time we saw him. At that point he been discharged without any open wounds. Nonetheless he has a small open wound on the posterior left lower extremity with some evidence of cellulitis noted as well. Fortunately I  feel like he has made good progress overall with regard to his lower extremities from were things used to be. 09/20/18 on evaluation today patient actually appears to be doing much better. The erythematous lower extremity is improving wound itself which is still open appears to be doing much better as far as but appearance  as well as pain is concerned overall very pleased in this regard. There's no signs of active infection at this time. 09/27/18 on evaluation today patient's wounds on the lower extremity actually appear to be doing fairly well at this time which is good news. There is no evidence of active infection currently and again is just as left lower extremity were there any wounds at this point anyway. I believe they may be completely healed but again I'm not 100% sure based on evaluation today. I think one more week of observation would likely be a good idea. 10/04/18 on evaluation today patient actually appears to be doing excellent in regard to the left lower extremity which actually appears to be completely healed as of today. Unfortunately he's been having issues with his right lower extremity have a new wound that has opened. Fortunately there's no evidence of active infection at this time which is good news. 10/10/18 upon evaluation today patient actually appears to be doing a little bit worse with the new open area on his right posterior lower extremity. He's been tolerating the dressing changes without complication. Right now we been using his compression wraps although I think we may need to switch back to actually performing bilateral compression wraps are in the clinic. No fevers, chills, nausea, or vomiting noted at this time. 10/17/18 on evaluation today patient actually appears to be doing quite well in regard to his bilateral lower extremity ulcers. He has been tolerating the reps without complication although he would prefer not to rewrap his legs as of today. Fortunately there's  no signs of active infection which is good news. No fevers, chills, nausea, or vomiting noted at this time. Patient History Information obtained from Patient. Family History Cancer - Paternal Grandparents, Kidney Disease - Siblings, Lung Disease - Mother, No family history of Diabetes, Heart Disease, Hereditary Spherocytosis, Hypertension, Seizures, Stroke, Thyroid Problems, Tuberculosis. Social History Never smoker, Marital Status - Married, Alcohol Use - Never, Drug Use - No History, Caffeine Use - Never. Medical History Eyes Denies history of Cataracts, Glaucoma, Optic Neuritis Ear/Nose/Mouth/Throat Denies history of Chronic sinus problems/congestion, Middle ear problems Hematologic/Lymphatic Patient has history of Anemia - aplastic anemia, Lymphedema Denies history of Hemophilia, Human Immunodeficiency Virus, Sickle Cell Disease Respiratory Patient has history of Chronic Obstructive Pulmonary Disease (COPD), Sleep Apnea Denies history of Aspiration, Asthma, Pneumothorax, Tuberculosis WILKINS, ELPERS ([id number]) Cardiovascular Patient has history of Congestive Heart Failure, Coronary Artery Disease, Hypertension, Peripheral Venous Disease Denies history of Angina, Arrhythmia, Deep Vein Thrombosis, Hypotension, Myocardial Infarction, Peripheral Arterial Disease, Phlebitis, Vasculitis Gastrointestinal Denies history of Cirrhosis , Colitis, Crohn s, Hepatitis A, Hepatitis B, Hepatitis C Endocrine Patient has history of Type II Diabetes Denies history of Type I Diabetes Genitourinary Denies history of End Stage Renal Disease Immunological Denies history of Lupus Erythematosus, Raynaud s, Scleroderma Integumentary (Skin) Denies history of History of Burn, History of pressure wounds Musculoskeletal Patient has history of Osteoarthritis Denies history of Gout, Rheumatoid Arthritis, Osteomyelitis Neurologic Patient has history of Neuropathy Denies history of Dementia,  Quadriplegia, Paraplegia, Seizure Disorder Oncologic Patient has history of Received Chemotherapy - last dose 2006 Denies history of Received Radiation Psychiatric Denies history of Anorexia/bulimia, Confinement Anxiety Hospitalization/Surgery History - bronchitis. Medical And Surgical History Notes Respiratory home O2 at night as needed Cardiovascular varicose veins Neurologic CVA - 1992 Review of Systems (ROS) Constitutional Symptoms (General Health) Denies complaints or symptoms of Fatigue, Fever, Chills, Marked Weight Change. Respiratory Denies complaints or symptoms of Chronic or frequent  coughs, Shortness of Breath. Cardiovascular Complains or has symptoms of LE edema. Psychiatric Denies complaints or symptoms of Anxiety, Claustrophobia. Objective Constitutional Obese and well-hydrated in no acute distress. BREYLON, SHERROW ([id number]) Vitals Time Taken: 12:41 PM, Height: 66 in, Weight: 323 lbs, BMI: 52.1, Temperature: 98.3 F, Pulse: 81 bpm, Respiratory Rate: 18 breaths/min, Blood Pressure: 141/81 mmHg. Respiratory normal breathing without difficulty. clear to auscultation bilaterally. Cardiovascular regular rate and rhythm with normal S1, S2. Psychiatric this patient is able to make decisions and demonstrates good insight into disease process. Alert and Oriented x 3. pleasant and cooperative. General Notes: Patient does have his Velcro compression wraps that he prefers with him today which is good news. Subsequently I do believe that his wounds are very close to complete closure there's just very tiny openings at the site at this point which is also good news. Again if you will wear them I think that switching over to his compression stockings would be okay. He states that he definitely will wear them as directed as well as use his compression pumps. Integumentary (Hair, Skin) Wound #19R status is Open. Original cause of wound was Gradually Appeared. The wound is  located on the Left,Distal,Posterior Lower Leg. The wound measures 1.8cm length x 1cm width x 0.1cm depth; 1.414cm^2 area and 0.141cm^3 volume. The wound is limited to skin breakdown. There is no tunneling or undermining noted. There is a medium amount of serous drainage noted. The wound margin is flat and intact. There is large (67-100%) pink granulation within the wound bed. There is no necrotic tissue within the wound bed. The periwound skin appearance exhibited: Maceration, Hemosiderin Staining. The periwound skin appearance did not exhibit: Callus, Crepitus, Excoriation, Induration, Rash, Scarring, Dry/Scaly, Atrophie Blanche, Cyanosis, Ecchymosis, Mottled, Pallor, Rubor, Erythema. Periwound temperature was noted as No Abnormality. Wound #20 status is Open. Original cause of wound was Gradually Appeared. The wound is located on the Right,Posterior Lower Leg. The wound measures 3cm length x 3.3cm width x 0.1cm depth; 7.775cm^2 area and 0.778cm^3 volume. There is Fat Layer (Subcutaneous Tissue) Exposed exposed. There is no tunneling or undermining noted. There is a large amount of serous drainage noted. The wound margin is flat and intact. There is medium (34-66%) pink granulation within the wound bed. There is a small (1-33%) amount of necrotic tissue within the wound bed including Adherent Slough. The periwound skin appearance exhibited: Maceration. 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 - Bilateral velcro wraps. Elevate legs to the level of the heart and pump ankles  as often as possible Compression Pump: Use compression pump on left lower extremity for 30 minutes, twice daily. Compression Pump: Use compression pump on right lower extremity for 30 minutes, twice daily. Wound #20 Right,Posterior Lower Leg: Patient to wear own Velcro compression garment. - Bilateral velcro wraps. Elevate legs to the level of the heart and pump ankles as often as possible Compression Pump: Use compression pump on left lower extremity for 30 minutes, twice daily. Compression Pump: Use compression pump on right lower extremity for 30 minutes, twice daily. My suggestion at this point is gonna be that we go ahead and continue  With the above wound care measures for the next week. The patient is in agreement with plan. We will subsequently see were things stand at follow-up. If anything changes worsens the meantime he will contact the office let me know. Please see above for specific wound care orders. We will see patient for re-evaluation in 1 week(s) here in the clinic. If anything worsens or changes patient will contact our office for additional recommendations. Electronic Signature(s) Signed: 10/17/2018 5:10:17 PM By: Worthy Keeler PA-C Entered By: Worthy Keeler on 10/17/2018 14:32:43 Adam Merritt ([id number]) -------------------------------------------------------------------------------- ROS/PFSH Details Patient Name: Adam Merritt Date of Service: 10/17/2018 12:30 PM Medical Record Number: [id number] Patient Account Number: [id number] Date of Birth/Sex: 1946-05-19 (73 y.o. M) Treating RN: Cornell Barman Primary Care Provider: [name] Other Clinician: Referring Provider: [name] Treating Provider/Extender: Melburn Hake, [name]: 52 Information Obtained From Patient Constitutional Symptoms (General Health) Complaints and Symptoms: Negative for: Fatigue; Fever; Chills; Marked Weight Change Respiratory Complaints and Symptoms: Negative for:  Chronic or frequent coughs; Shortness of Breath Medical History: Positive for: Chronic Obstructive Pulmonary Disease (COPD); Sleep Apnea Negative for: Aspiration; Asthma; Pneumothorax; Tuberculosis Past Medical History Notes: home O2 at night as needed Cardiovascular Complaints and Symptoms: Positive for: LE edema Medical History: Positive for: Congestive Heart Failure; Coronary Artery Disease; Hypertension; Peripheral Venous Disease Negative for: Angina; Arrhythmia; Deep Vein Thrombosis; Hypotension; Myocardial Infarction; Peripheral Arterial Disease; Phlebitis; Vasculitis Past Medical History Notes: varicose veins Psychiatric Complaints and Symptoms: Negative for: Anxiety; Claustrophobia Medical History: Negative for: Anorexia/bulimia; Confinement Anxiety Eyes Medical History: Negative for: Cataracts; Glaucoma; Optic Neuritis Ear/Nose/Mouth/Throat Medical History: Negative for: Chronic sinus problems/congestion; Middle ear problems Hematologic/Lymphatic LADD, CEN ([id number]) Medical History: Positive for: Anemia - aplastic anemia; Lymphedema Negative for: Hemophilia; Human Immunodeficiency Virus; Sickle Cell Disease Gastrointestinal Medical History: Negative for: Cirrhosis ; Colitis; Crohnos; Hepatitis A; Hepatitis B; Hepatitis C Endocrine Medical History: Positive for: Type II Diabetes Negative for: Type I Diabetes Time with diabetes: since 2006 Treated with: Insulin Blood sugar tested every day: Yes Tested : Blood sugar testing results: Breakfast: 209 Genitourinary Medical History: Negative for: End Stage Renal Disease Immunological Medical History: Negative for: Lupus Erythematosus; Raynaudos; Scleroderma Integumentary (Skin) Medical History: Negative for: History of Burn; History of pressure wounds Musculoskeletal Medical History: Positive for: Osteoarthritis Negative for: Gout; Rheumatoid Arthritis; Osteomyelitis Neurologic Medical  History: Positive for: Neuropathy Negative for: Dementia; Quadriplegia; Paraplegia; Seizure Disorder Past Medical History Notes: CVA - 1992 Oncologic Medical History: Positive for: Received Chemotherapy - last dose 2006 Negative for: Received Radiation Immunizations Pneumococcal Vaccine: Received Pneumococcal Vaccination: Yes [facility], [facility] ([id number]) Implantable Devices No devices added Hospitalization / Surgery History Type of Hospitalization/Surgery bronchitis Family and Social History Cancer: Yes - Paternal Grandparents; Diabetes: No; Heart Disease: No; Hereditary Spherocytosis: No; Hypertension: No; Kidney Disease: Yes - Siblings; Lung Disease: Yes - Mother; Seizures: No; Stroke: No; Thyroid Problems: No; Tuberculosis: No; Never smoker; Marital Status - Married; Alcohol Use: Never; Drug Use: No History; Caffeine Use: Never; Financial Concerns: No; Food, Clothing or Shelter Needs: No; Support System Lacking: No; Transportation Concerns: No Physician Affirmation I have reviewed and agree with the above information. Electronic Signature(s) Signed: 10/17/2018 4:49:14 PM By: Gretta Cool, BSN, RN, CWS, Kim RN, BSN Signed: 10/17/2018 5:10:17 PM By: Worthy Keeler PA-C Entered By: Worthy Keeler on 10/17/2018 14:31:48 Adam Merritt ([id number]) -------------------------------------------------------------------------------- SuperBill Details Patient Name: Adam Merritt Date of Service: 10/17/2018 Medical Record Number: [id number] Patient Account Number: [id number]  Date of Birth/Sex: [birth year] (73 y.o. M) Treating RN: Cornell Barman Primary Care Provider: [name] Other Clinician: Referring Provider: [name] Treating Provider/Extender: Melburn Hake, [name]: 27 Diagnosis Coding ICD-10 Codes Code Description I89.0 Lymphedema, not elsewhere classified L97.512 Non-pressure chronic ulcer of other part of right foot with fat layer exposed L97.822 Non-pressure chronic  ulcer of other part of left lower leg with fat layer exposed L98.492 Non-pressure chronic ulcer of skin of other sites with fat layer exposed E66.9 Obesity, unspecified Facility Procedures CPT4 Code: [id number] Description: 06015 - WOUND CARE VISIT-LEV 2 EST PT Modifier: Quantity: 1 Physician Procedures CPT4 Code Description: [id number] 99214 - WC PHYS LEVEL 4 - EST PT ICD-10 Diagnosis Description I89.0 Lymphedema, not elsewhere classified L97.512 Non-pressure chronic ulcer of other part of right foot with fat L97.822 Non-pressure chronic ulcer of other  part of left lower leg with L98.492 Non-pressure chronic ulcer of skin of other sites with fat layer Modifier: layer exposed fat layer expos exposed Quantity: 1 ed Electronic Signature(s) Signed: 10/17/2018 5:10:17 PM By: Worthy Keeler PA-C Entered By: Worthy Keeler on 10/17/2018 14:33:04

## 2018-10-24 ENCOUNTER — Other Ambulatory Visit: Payer: Self-pay

## 2018-10-24 ENCOUNTER — Encounter: Payer: Medicare Other | Admitting: Physician Assistant

## 2018-10-24 DIAGNOSIS — E11621 Type 2 diabetes mellitus with foot ulcer: Secondary | ICD-10-CM | POA: Diagnosis not present

## 2018-10-24 NOTE — Progress Notes (Signed)
DARNELLE, CORP ([id number]) Visit Report for 10/24/2018 Arrival Information Details Patient Name: Adam Merritt, Adam Merritt Date of Service: 10/24/2018 12:45 PM Medical Record Number: [id number] Patient Account Number: [id number] Date of Birth/Sex: 02/07/1946 (73 y.o. M) Treating RN: Harold Barban Primary Care Adham Johnson: [name] Other Clinician: Referring Jawuan Robb: [name] Treating Tonnia Bardin/Extender: Melburn Hake, [name]: 28 Visit Information History Since Last Visit Added or deleted any medications: No Patient Arrived: Walker Any new allergies or adverse reactions: No Arrival Time: 12:39 Had a fall or experienced change in No Accompanied By: self activities of daily living that may affect Transfer Assistance: None risk of falls: Patient Identification Verified: Yes Signs or symptoms of abuse/neglect since last visito No Secondary Verification Process Yes Hospitalized since last visit: No Completed: Has Dressing in Place as Prescribed: Yes Patient Has Alerts: Yes Has Compression in Place as Prescribed: Yes Patient Alerts: Patient on Blood Pain Present Now: Yes Thinner Aspirin 325mg  Type II Diabetes ABI 5/19 L 1.1 R 0.9 TBI L 0.6 R 0.6 Electronic Signature(s) Signed: 10/24/2018 2:09:52 PM By: Harold Barban Entered By: Harold Barban on 10/24/2018 12:39:58 Adam Merritt ([id number]) -------------------------------------------------------------------------------- Clinic Level of Care Assessment Details Patient Name: Adam Merritt Date of Service: 10/24/2018 12:45 PM Medical Record Number: [id number] Patient Account Number: [id number] Date of Birth/Sex: [birth year] (73 y.o. M) Treating RN: Cornell Barman Primary Care Yanely Mast: [name] Other Clinician: Referring Merdith Boyd: [name] Treating Olubunmi Rothenberger/Extender: Melburn Hake, [name]: 28 Clinic Level of Care Assessment Items TOOL 4 Quantity Score []  - Use when only an EandM is performed on  FOLLOW-UP visit 0 ASSESSMENTS - Nursing Assessment / Reassessment X - Reassessment of Co-morbidities (includes updates in patient status) 1 10 X- 1 5 Reassessment of Adherence to Treatment Plan ASSESSMENTS - Wound and Skin Assessment / Reassessment X - Simple Wound Assessment / Reassessment - one wound 1 5 []  - 0 Complex Wound Assessment / Reassessment - multiple wounds []  - 0 Dermatologic / Skin Assessment (not related to wound area) ASSESSMENTS - Focused Assessment []  - Circumferential Edema Measurements - multi extremities 0 []  - 0 Nutritional Assessment / Counseling / Intervention []  - 0 Lower Extremity Assessment (monofilament, tuning fork, pulses) []  - 0 Peripheral Arterial Disease Assessment (using hand held doppler) ASSESSMENTS - Ostomy and/or Continence Assessment and Care []  - Incontinence Assessment and Management 0 []  - 0 Ostomy Care Assessment and Management (repouching, etc.) PROCESS - Coordination of Care X - Simple Patient / Family Education for ongoing care 1 15 []  - 0 Complex (extensive) Patient / Family Education for ongoing care X- 1 10 Staff obtains Programmer, systems, Records, Test Results / Process Orders []  - 0 Staff telephones HHA, Nursing Homes / Clarify orders / etc []  - 0 Routine Transfer to another Facility (non-emergent condition) []  - 0 Routine Hospital Admission (non-emergent condition) []  - 0 New Admissions / Biomedical engineer / Ordering NPWT, Apligraf, etc. []  - 0 Emergency Hospital Admission (emergent condition) X- 1 10 Simple Discharge Coordination []  - 0 Complex (extensive) Discharge Coordination PROCESS - Special Needs []  - Pediatric / Minor Patient Management 0 []  - 0 Isolation Patient Management Adam Merritt, Adam Merritt ([id number]) []  - 0 Hearing / Language / Visual special needs []  - 0 Assessment of Community assistance (transportation, D/C planning, etc.) []  - 0 Additional assistance / Altered mentation []  - 0 Support Surface(s)  Assessment (bed, cushion, seat, etc.) INTERVENTIONS - Wound Cleansing / Measurement X - Simple Wound Cleansing - one wound 1 5 []  -  0 Complex Wound Cleansing - multiple wounds X- 1 5 Wound Imaging (photographs - any number of wounds) []  - 0 Wound Tracing (instead of photographs) X- 1 5 Simple Wound Measurement - one wound []  - 0 Complex Wound Measurement - multiple wounds INTERVENTIONS - Wound Dressings X - Small Wound Dressing one or multiple wounds 1 10 []  - 0 Medium Wound Dressing one or multiple wounds []  - 0 Large Wound Dressing one or multiple wounds []  - 0 Application of Medications - topical []  - 0 Application of Medications - injection INTERVENTIONS - Miscellaneous []  - External ear exam 0 []  - 0 Specimen Collection (cultures, biopsies, blood, body fluids, etc.) []  - 0 Specimen(s) / Culture(s) sent or taken to Lab for analysis []  - 0 Patient Transfer (multiple staff / Civil Service fast streamer / Similar devices) []  - 0 Simple Staple / Suture removal (25 or less) []  - 0 Complex Staple / Suture removal (26 or more) []  - 0 Hypo / Hyperglycemic Management (close monitor of Blood Glucose) []  - 0 Ankle / Brachial Index (ABI) - do not check if billed separately X- 1 5 Vital Signs Has the patient been seen at the hospital within the last three years: Yes Total Score: 85 Level Of Care: New/Established - Level 3 Electronic Signature(s) Signed: 10/24/2018 4:50:49 PM By: Gretta Cool, BSN, RN, CWS, Kim RN, BSN Entered By: Gretta Cool, BSN, RN, CWS, Kim on 10/24/2018 12:55:48 Adam Merritt ([id number]) -------------------------------------------------------------------------------- Encounter Discharge Information Details Patient Name: Adam Merritt Date of Service: 10/24/2018 12:45 PM Medical Record Number: [id number] Patient Account Number: [id number] Date of Birth/Sex: March 16, 1946 (73 y.o. M) Treating RN: Montey Hora Primary Care Kevante Lunt: [name] Other Clinician: Referring Zyriah Mask:  [name] Treating Jadia Capers/Extender: Melburn Hake, [name]: 53 Encounter Discharge Information Items Discharge Condition: Stable Ambulatory Status: Walker Discharge Destination: Home Transportation: Private Auto Accompanied By: spouse Schedule Follow-up Appointment: Yes Clinical Summary of Care: Electronic Signature(s) Signed: 10/24/2018 4:20:47 PM By: Montey Hora Entered By: Montey Hora on 10/24/2018 13:08:44 Adam Merritt ([id number]) -------------------------------------------------------------------------------- Lower Extremity Assessment Details Patient Name: Adam Merritt Date of Service: 10/24/2018 12:45 PM Medical Record Number: [id number] Patient Account Number: [id number] Date of Birth/Sex: 09/11/1945 (73 y.o. M) Treating RN: Harold Barban Primary Care Arleny Kruger: [name] Other Clinician: Referring Chrystle Murillo: [name] Treating Dedric Ethington/Extender: Melburn Hake, [name]: 28 Edema Assessment Assessed: [Left: No] [Right: No] Edema: [Left: Yes] [Right: Yes] Calf Left: Right: Point of Measurement: 37 cm From Medial Instep 62 cm 55.7 cm Ankle Left: Right: Point of Measurement: 12 cm From Medial Instep 39.7 cm 32.5 cm Vascular Assessment Pulses: Dorsalis Pedis Palpable: [Left:Yes] [Right:Yes] Electronic Signature(s) Signed: 10/24/2018 2:09:52 PM By: Harold Barban Entered By: Harold Barban on 10/24/2018 12:47:23 Adam Merritt ([id number]) -------------------------------------------------------------------------------- Multi Wound Chart Details Patient Name: Adam Merritt Date of Service: 10/24/2018 12:45 PM Medical Record Number: [id number] Patient Account Number: [id number] Date of Birth/Sex: Dec 23, 1945 (73 y.o. 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 Extremity Comorbid History: Anemia, Lymphedema, Anemia, Lymphedema, N/A Chronic Obstructive Chronic Obstructive  Pulmonary Disease (COPD), Pulmonary Disease (COPD), Sleep Apnea, Congestive Sleep Apnea, Congestive Heart Failure, Coronary Artery Heart Failure, Coronary Artery Disease, Hypertension, Disease, Hypertension, Peripheral Venous Disease, Peripheral Venous Disease, Type II Diabetes, Type II Diabetes, Osteoarthritis, Neuropathy, Osteoarthritis, Neuropathy, Received Chemotherapy Received Chemotherapy Date Acquired: 09/12/2018 10/02/2018 N/A Weeks of Treatment: 5 2 N/A Wound Status: Healed - Epithelialized Open N/A Wound Recurrence: Yes No N/A Measurements L x W x D 0x0x0 1.5x2x0.1 N/A (cm) Area (cm) : 0 2.356 N/A Volume (cm) : 0 0.236 N/A % Reduction in Area: 100.00% 85.00% N/A % Reduction in Volume: 100.00% 85.00% N/A Classification: Grade 1 Grade 2 N/A Exudate Amount: None Present Medium N/A Exudate Type: N/A Serous N/A Exudate Color: N/A amber N/A Wound Margin: Flat and Intact Flat and Intact N/A Granulation Amount: None Present (0%) Large (67-100%) N/A Granulation Quality: N/A Pink N/A Necrotic Amount: None Present (0%) None Present (0%) N/A Exposed Structures: Fascia: No Fat Layer (Subcutaneous N/A Fat Layer (Subcutaneous Tissue) Exposed: Yes Tissue) Exposed: No Fascia: No Tendon: No Tendon: No Adam Merritt, Adam Merritt ([id number]) Muscle: No Muscle: No Joint: No Joint: No Bone: No Bone: No Limited to Skin Breakdown Epithelialization: Large (67-100%) Large  (67-100%) N/A Treatment Notes Electronic Signature(s) Signed: 10/24/2018 4:50:49 PM By: Gretta Cool, BSN, RN, CWS, Kim RN, BSN Entered By: Gretta Cool, BSN, RN, CWS, Kim on 10/24/2018 12:54:12 Adam Merritt ([id number]) -------------------------------------------------------------------------------- [place] Details Patient Name: Adam Merritt Date of Service: 10/24/2018 12:45 PM Medical Record Number: [id number] Patient Account Number: [id number] Date of Birth/Sex: 1946-03-01 (73 y.o. M) Treating RN: Cornell Barman Primary Care Rally Ouch: [name] Other Clinician: Referring Shemaiah Round: [name] Treating Paarth Cropper/Extender: Melburn Hake, [name]: 88 Active Inactive Abuse / Safety / Falls / Self Care Management Nursing Diagnoses: Potential for falls Goals: Patient will remain injury free related to falls Date Initiated: 04/09/2018 Target Resolution Date: 10/05/2018 Goal Status: Active Interventions: Assess fall risk on admission and as needed Notes: Venous Leg Ulcer Nursing Diagnoses: Actual venous Insuffiency (use after diagnosis is confirmed) Goals: Patient will maintain optimal edema control Date Initiated: 04/09/2018 Target Resolution Date: 10/05/2018 Goal Status: Active Interventions: Assess peripheral edema status every visit. Compression as ordered Provide education on venous insufficiency Notes: Electronic Signature(s) Signed: 10/24/2018 4:50:49 PM By: Gretta Cool, BSN, RN, CWS, Kim RN, BSN Entered By: Gretta Cool, BSN, RN, CWS, Kim on 10/24/2018 12:53:43 Adam Merritt ([id number]) -------------------------------------------------------------------------------- Pain Assessment Details Patient Name: Adam Merritt Date of Service: 10/24/2018 12:45 PM Medical Record Number: [id number] Patient Account Number: [id number] Date of Birth/Sex: Jun 22, 1945 (73 y.o. M) Treating RN: Harold Barban Primary Care Jentzen Minasyan: [name] Other Clinician: Referring Yunique Dearcos:  [name] Treating Dauntae Derusha/Extender: Melburn Hake, [name]: 28 Active Problems Location of Pain Severity and Description of Pain Patient Has Paino No Site Locations Pain Management and Medication Current Pain Management: Electronic Signature(s) Signed: 10/24/2018 2:09:52 PM By: Harold Barban Entered By: Harold Barban on 10/24/2018 12:41:07 Adam Merritt ([id number]) -------------------------------------------------------------------------------- Patient/Caregiver Education Details Patient Name: Adam Merritt Date of Service: 10/24/2018 12:45 PM Medical Record Number: [id number] Patient Account Number: [id number] Date of Birth/Gender: [birth year] (73 y.o. M) Treating RN: Cornell Barman Primary Care Physician: [name] Other Clinician: Referring Physician: [name] Treating Physician/Extender: Sharalyn Ink in Treatment: 37 Education Assessment Education Provided To: Patient Education Topics Provided Wound/Skin Impairment: Handouts: Caring for Your Ulcer Methods: Demonstration, Explain/Verbal Responses: State content correctly Electronic Signature(s) Signed: 10/24/2018 4:50:49 PM By: Gretta Cool, BSN, RN, CWS, Kim RN, BSN Entered  By: Gretta Cool, BSN, RN, CWS, Kim on 10/24/2018 12:56:20 Adam Merritt ([id number]) -------------------------------------------------------------------------------- Wound Assessment Details Patient Name: Adam Merritt Date of Service: 10/24/2018 12:45 PM Medical Record Number: [id number] Patient Account Number: [id number] Date of Birth/Sex: 11/08/1945 (73 y.o. M) Treating RN: Cornell Barman Primary Care Llana Deshazo: [name] Other Clinician: Referring Analiz Tvedt: [name] Treating Icesis Renn/Extender: Melburn Hake, [name]: 28 Wound Status Wound Number: [id number] Primary Diabetic Wound/Ulcer of the Lower Extremity Etiology: Wound Location: Left, Distal, Posterior Lower Leg Wound Healed - Epithelialized Wounding Event:  Gradually Appeared Status: Date Acquired: 09/12/2018 Comorbid Anemia, Lymphedema, Chronic Obstructive Weeks Of Treatment: 5 History: Pulmonary Disease (COPD), Sleep Apnea, Clustered Wound: No Congestive Heart Failure, Coronary Artery Disease, Hypertension, Peripheral Venous Disease, Type II Diabetes, Osteoarthritis, Neuropathy, Received Chemotherapy Photos Wound Measurements Length: (cm) 0 Width: (cm) 0 Depth: (cm) 0 Area: (cm) 0 Volume: (cm) 0 % Reduction in Area: 100% % Reduction in Volume: 100% Epithelialization: Large (67-100%) Tunneling: No Undermining: No Wound Description Classification: Grade 1 Wound Margin: Flat and Intact Exudate Amount: None Present Foul Odor After Cleansing: No Slough/Fibrino No Wound Bed Granulation Amount: None Present (0%) Exposed Structure Necrotic Amount: None Present (0%) Fascia Exposed: No Fat Layer (Subcutaneous Tissue) Exposed: No Tendon Exposed: No Muscle Exposed: No Joint Exposed: No Bone Exposed: No Limited to Skin Breakdown Electronic Signature(s) Signed: 10/24/2018 4:50:49 PM By: Gretta Cool, BSN, RN, CWS, Kim RN, BSN Entered By: Gretta Cool, BSN, RN, CWS, Kim on 10/24/2018 12:53:34 Adam Merritt ([id number]) -------------------------------------------------------------------------------- Wound Assessment Details Patient Name: Adam Merritt Date of Service: 10/24/2018 12:45 PM Medical Record Number: [id number] Patient Account Number: [id number] Date of Birth/Sex: 1945-11-22 (73 y.o. M) Treating RN: Harold Barban Primary Care Jerald Hennington: [name] Other Clinician: Referring Kitty Cadavid: [name] Treating Jahni Paul/Extender: STONE III, [name]: 28 Wound Status Wound Number: [id number] Primary Diabetic Wound/Ulcer of the Lower Extremity Etiology: Wound Location: Right Lower Leg - Posterior Wound Open Wounding Event: Gradually Appeared Status: Date Acquired: 10/02/2018 Comorbid Anemia, Lymphedema, Chronic  Obstructive Weeks Of Treatment: 2 History: Pulmonary Disease (COPD), Sleep Apnea, Clustered Wound: No Congestive Heart Failure, Coronary Artery Disease, Hypertension, Peripheral Venous Disease, Type II Diabetes, Osteoarthritis, Neuropathy, Received Chemotherapy Photos Wound Measurements Length: (cm) 1.5 Width: (cm) 2 Depth: (cm) 0.1 Area: (cm) 2.356 Volume: (cm) 0.236 % Reduction in Area: 85% % Reduction in Volume: 85% Epithelialization: Large (67-100%) Tunneling: No Undermining: No Wound Description Classification: Grade 2 Wound Margin: Flat and Intact Exudate Amount: Medium Exudate Type: Serous Exudate Color: amber Foul Odor After Cleansing: No Slough/Fibrino Yes Wound Bed Granulation Amount: Large (67-100%) Exposed Structure Granulation Quality: Pink Fascia Exposed: No Necrotic Amount: None Present (0%) Fat Layer (Subcutaneous Tissue) Exposed: Yes Tendon Exposed: No Muscle Exposed: No Joint Exposed: No Bone Exposed: No Treatment Notes Wound #20 (Right, Posterior Lower Leg) Notes Silver alginate, BFD, Patients own compression wraps Adam Merritt, Adam Merritt ([id number]) Electronic Signature(s) Signed: 10/24/2018 2:09:52 PM By: Harold Barban Entered By: Harold Barban on 10/24/2018 12:49:35 Adam Merritt ([id number]) -------------------------------------------------------------------------------- Vitals Details Patient Name: Adam Merritt Date of Service: 10/24/2018 12:45 PM Medical Record Number: [id number] Patient Account Number: [id number] Date of Birth/Sex: 08-Oct-1945 (73 y.o. M) Treating RN: Harold Barban Primary Care Selene Peltzer: [name] Other Clinician: Referring Cyrah Mclamb: [name] Treating Elgie Landino/Extender: Melburn Hake, [name]: 28 Vital Signs Time Taken: 12:41 Temperature (F): 98.4 Height (in): 66 Pulse (bpm): 81 Weight (lbs): 323 Respiratory Rate (breaths/min): 18 Body Mass Index (BMI): 52.1 Blood Pressure (mmHg):  168/82  Reference Range: 80 - 120 mg / dl Electronic Signature(s) Signed: 10/24/2018 2:09:52 PM By: Harold Barban Entered By: Harold Barban on 10/24/2018 12:45:12

## 2018-10-31 ENCOUNTER — Encounter: Payer: Medicare Other | Admitting: Physician Assistant

## 2018-10-31 ENCOUNTER — Other Ambulatory Visit: Payer: Self-pay

## 2018-10-31 DIAGNOSIS — E11621 Type 2 diabetes mellitus with foot ulcer: Secondary | ICD-10-CM | POA: Diagnosis not present

## 2018-11-04 ENCOUNTER — Other Ambulatory Visit: Payer: Self-pay

## 2018-11-04 ENCOUNTER — Encounter: Payer: Medicare Other | Attending: Physician Assistant

## 2018-11-04 DIAGNOSIS — L97512 Non-pressure chronic ulcer of other part of right foot with fat layer exposed: Secondary | ICD-10-CM | POA: Diagnosis not present

## 2018-11-04 DIAGNOSIS — L97829 Non-pressure chronic ulcer of other part of left lower leg with unspecified severity: Secondary | ICD-10-CM | POA: Insufficient documentation

## 2018-11-04 DIAGNOSIS — L98492 Non-pressure chronic ulcer of skin of other sites with fat layer exposed: Secondary | ICD-10-CM | POA: Diagnosis not present

## 2018-11-04 DIAGNOSIS — Z8673 Personal history of transient ischemic attack (TIA), and cerebral infarction without residual deficits: Secondary | ICD-10-CM | POA: Diagnosis not present

## 2018-11-04 DIAGNOSIS — K5900 Constipation, unspecified: Secondary | ICD-10-CM | POA: Diagnosis not present

## 2018-11-04 DIAGNOSIS — I890 Lymphedema, not elsewhere classified: Secondary | ICD-10-CM | POA: Diagnosis not present

## 2018-11-04 DIAGNOSIS — I2510 Atherosclerotic heart disease of native coronary artery without angina pectoris: Secondary | ICD-10-CM | POA: Diagnosis not present

## 2018-11-04 DIAGNOSIS — E11621 Type 2 diabetes mellitus with foot ulcer: Secondary | ICD-10-CM | POA: Insufficient documentation

## 2018-11-04 DIAGNOSIS — M1990 Unspecified osteoarthritis, unspecified site: Secondary | ICD-10-CM | POA: Diagnosis not present

## 2018-11-04 DIAGNOSIS — D649 Anemia, unspecified: Secondary | ICD-10-CM | POA: Insufficient documentation

## 2018-11-04 DIAGNOSIS — E1140 Type 2 diabetes mellitus with diabetic neuropathy, unspecified: Secondary | ICD-10-CM | POA: Diagnosis not present

## 2018-11-04 DIAGNOSIS — Z9221 Personal history of antineoplastic chemotherapy: Secondary | ICD-10-CM | POA: Diagnosis not present

## 2018-11-04 DIAGNOSIS — E669 Obesity, unspecified: Secondary | ICD-10-CM | POA: Insufficient documentation

## 2018-11-04 DIAGNOSIS — I509 Heart failure, unspecified: Secondary | ICD-10-CM | POA: Insufficient documentation

## 2018-11-04 DIAGNOSIS — G8929 Other chronic pain: Secondary | ICD-10-CM | POA: Diagnosis not present

## 2018-11-04 DIAGNOSIS — J449 Chronic obstructive pulmonary disease, unspecified: Secondary | ICD-10-CM | POA: Diagnosis not present

## 2018-11-04 DIAGNOSIS — G4730 Sleep apnea, unspecified: Secondary | ICD-10-CM | POA: Diagnosis not present

## 2018-11-04 DIAGNOSIS — L97822 Non-pressure chronic ulcer of other part of left lower leg with fat layer exposed: Secondary | ICD-10-CM | POA: Insufficient documentation

## 2018-11-04 DIAGNOSIS — I110 Hypertensive heart disease with heart failure: Secondary | ICD-10-CM | POA: Insufficient documentation

## 2018-11-04 NOTE — Progress Notes (Signed)
JOHNELL, BAS ([id number]) Visit Report for 11/04/2018 Arrival Information Details Patient Name: Adam Merritt, Adam Merritt Date of Service: 11/04/2018 2:00 PM Medical Record Number: [id number] Patient Account Number: [id number] Date of Birth/Sex: 04/07/1946 (73 y.o. M) Treating RN: Montey Hora Primary Care Britnay Magnussen: [name] Other Clinician: Referring Zelia Yzaguirre: [name] Treating Titianna Loomis/Extender: Melburn Hake, [name]: 1 Visit Information History Since Last Visit Added or deleted any medications: No Patient Arrived: Cane Any new allergies or adverse reactions: No Arrival Time: 14:28 Had a fall or experienced change in No Accompanied By: self activities of daily living that may affect Transfer Assistance: None risk of falls: Patient Identification Verified: Yes Signs or symptoms of abuse/neglect since last visito No Secondary Verification Process Yes Hospitalized since last visit: No Completed: Implantable device outside of the clinic excluding No Patient Has Alerts: Yes cellular tissue based products placed in the center Patient Alerts: Patient on Blood since last visit: Thinner Has Dressing in Place as Prescribed: Yes Aspirin 325mg  Has Compression in Place as Prescribed: Yes Type II Diabetes ABI 5/19 L 1.1 R 0.9 Pain Present Now: No TBI L 0.6 R 0.6 Electronic Signature(s) Signed: 11/04/2018 3:10:52 PM By: Montey Hora Entered By: Montey Hora on 11/04/2018 14:36:51 Adam Merritt ([id number]) -------------------------------------------------------------------------------- Compression Therapy Details Patient Name: Adam Merritt Date of Service: 11/04/2018 2:00 PM Medical Record Number: [id number] Patient Account Number: [id number] Date of Birth/Sex: 1946-06-01 (73 y.o. M) Treating RN: Montey Hora Primary Care Ercelle Winkles: [name] Other Clinician: Referring Delpha Perko: [name] Treating Brelyn Woehl/Extender: Melburn Hake, [name]:  29 Compression Therapy Performed for Wound Assessment: Wound #20 Right,Posterior Lower Leg Performed By: Clinician Montey Hora, RN Compression Type: Four Layer Pre Treatment ABI: 1.1 Electronic Signature(s) Signed: 11/04/2018 3:06:31 PM By: Montey Hora Entered By: Montey Hora on 11/04/2018 15:06:31 Adam Merritt ([id number]) -------------------------------------------------------------------------------- Compression Therapy Details Patient Name: Adam Merritt Date of Service: 11/04/2018 2:00 PM Medical Record Number: [id number] Patient Account Number: [id number] Date of Birth/Sex: 1946-04-19 (73 y.o. M) Treating RN: Montey Hora Primary Care Taegan Standage: [name] Other Clinician: Referring Aritha Huckeba: [name] Treating Lizett Chowning/Extender: Melburn Hake, [name]: 29 Compression Therapy Performed for Wound Assessment: Wound #21 Right,Anterior Lower Leg Performed By: Clinician Montey Hora, RN Compression Type: Four Layer Pre Treatment ABI: 1.1 Electronic Signature(s) Signed: 11/04/2018 3:06:42 PM By: Montey Hora Entered By: Montey Hora on 11/04/2018 15:06:41 Adam Merritt ([id number]) -------------------------------------------------------------------------------- Compression Therapy Details Patient Name: Adam Merritt Date of Service: 11/04/2018 2:00 PM Medical Record Number: [id number] Patient Account Number: [id number] Date of Birth/Sex: 1946-05-29 (73 y.o. M) Treating RN: Montey Hora Primary Care Kirston Luty: [name] Other Clinician: Referring Tysheem Accardo: [name] Treating Jakai Onofre/Extender: Melburn Hake, [name]: 29 Compression Therapy Performed for Wound Assessment: Wound #22 Left,Circumferential Lower Leg Performed By: Clinician Montey Hora, RN Compression Type: Four Layer Pre Treatment ABI: 1.1 Electronic Signature(s) Signed: 11/04/2018 3:06:52 PM By: Montey Hora Entered By: Montey Hora on 11/04/2018  15:06:52 Adam Merritt ([id number]) -------------------------------------------------------------------------------- Encounter Discharge Information Details Patient Name: Adam Merritt Date of Service: 11/04/2018 2:00 PM Medical Record Number: [id number] Patient Account Number: [id number] Date of Birth/Sex: [birth year] (73 y.o. M) Treating RN: Montey Hora Primary Care [place] Gustafson: [name] Other Clinician: Referring Correen Bubolz: [name] Treating Denesia Donelan/Extender: Melburn Hake, [name]: 3 Encounter Discharge Information Items Discharge Condition: Stable Ambulatory Status: Cane Discharge Destination: Home Transportation: Private Auto Accompanied By: self Schedule Follow-up Appointment: Yes Clinical Summary of Care: Electronic Signature(s) Signed:  11/04/2018 3:07:53 PM By: Montey Hora Entered By: Montey Hora on 11/04/2018 15:07:53 Adam Merritt ([id number]) -------------------------------------------------------------------------------- Wound Assessment Details Patient Name: Adam Merritt Date of Service: 11/04/2018 2:00 PM Medical Record Number: [id number] Patient Account Number: [id number] Date of Birth/Sex: 09-17-1945 (73 y.o. M) Treating RN: Montey Hora Primary Care Syanne Looney: [name] Other Clinician: Referring Alexica Schlossberg: [name] Treating Lasundra Hascall/Extender: Melburn Hake, [name]: 29 Wound Status Wound Number: 20 Primary Diabetic Wound/Ulcer of the Lower Extremity Etiology: Wound Location: Right Lower Leg - Posterior Wound Open Wounding Event: Gradually Appeared Status: Date Acquired: 10/02/2018 Comorbid Anemia, Lymphedema, Chronic Obstructive Weeks Of Treatment: 4 History: Pulmonary Disease (COPD), Sleep Apnea, Clustered Wound: No Congestive Heart Failure, Coronary Artery Disease, Hypertension, Peripheral Venous Disease, Type II Diabetes, Osteoarthritis, Neuropathy, Received Chemotherapy Wound Measurements Length: (cm)  1.5 Width: (cm) 1.8 Depth: (cm) 0.1 Area: (cm) 2.121 Volume: (cm) 0.212 % Reduction in Area: 86.5% % Reduction in Volume: 86.5% Epithelialization: Large (67-100%) Tunneling: No Undermining: No Wound Description Classification: Grade 2 Wound Margin: Flat and Intact Exudate Amount: Medium Exudate Type: Serous Exudate Color: amber Foul Odor After Cleansing: No Slough/Fibrino Yes Wound Bed Granulation Amount: Large (67-100%) Exposed Structure Granulation Quality: Pink Fascia Exposed: No Necrotic Amount: None Present (0%) Fat Layer (Subcutaneous Tissue) Exposed: Yes Tendon Exposed: No Muscle Exposed: No Joint Exposed: No Bone Exposed: No Treatment Notes Wound #20 (Right, Posterior Lower Leg) Notes Silver alginate, xtrasorb, 4 layer wrap Electronic Signature(s) Signed: 11/04/2018 3:10:52 PM By: Montey Hora Entered By: Montey Hora on 11/04/2018 14:37:31 Adam Merritt ([id number]) JERAMIA, SALEEBY ([id number]) -------------------------------------------------------------------------------- Wound Assessment Details Patient Name: Adam Merritt Date of Service: 11/04/2018 2:00 PM Medical Record Number: [id number] Patient Account Number: [id number] Date of Birth/Sex: 10-11-1945 (73 y.o. M) Treating RN: Montey Hora Primary Care Emmajane Altamura: [name] Other Clinician: Referring Breonia Kirstein: [name] Treating Caitland Porchia/Extender: Melburn Hake, [name]: 29 Wound Status Wound Number: 21 Primary Lymphedema Etiology: Wound Location: Right Lower Leg - Anterior Wound Open Wounding Event: Gradually Appeared Status: Date Acquired: 10/25/2018 Comorbid Anemia, Lymphedema, Chronic Obstructive Weeks Of Treatment: 0 History: Pulmonary Disease (COPD), Sleep Apnea, Clustered Wound: No Congestive Heart Failure, Coronary Artery Disease, Hypertension, Peripheral Venous Disease, Type II Diabetes, Osteoarthritis, Neuropathy, Received Chemotherapy Wound  Measurements Length: (cm) 2.3 Width: (cm) 3 Depth: (cm) 0.1 Area: (cm) 5.419 Volume: (cm) 0.542 % Reduction in Area: 0% % Reduction in Volume: 0% Epithelialization: Medium (34-66%) Tunneling: No Undermining: No Wound Description Classification: Partial Thickness Wound Margin: Flat and Intact Exudate Amount: Large Exudate Type: Serous Exudate Color: amber Foul Odor After Cleansing: No Slough/Fibrino No Wound Bed Granulation Amount: Large (67-100%) Exposed Structure Granulation Quality: Red Fascia Exposed: No Necrotic Amount: None Present (0%) Fat Layer (Subcutaneous Tissue) Exposed: No Tendon Exposed: No Muscle Exposed: No Joint Exposed: No Bone Exposed: No Limited to Skin Breakdown Treatment Notes Wound #21 (Right, Anterior Lower Leg) Notes Silver alginate, xtrasorb, 4 layer wrap Electronic Signature(s) Signed: 11/04/2018 3:10:52 PM By: Montey Hora Entered By: Montey Hora on 11/04/2018 14:37:43 Adam Merritt ([id number]) LAWSEN, ARNOTT ([id number]) -------------------------------------------------------------------------------- Wound Assessment Details Patient Name: Adam Merritt Date of Service: 11/04/2018 2:00 PM Medical Record Number: [id number] Patient Account Number: [id number] Date of Birth/Sex: November 30, 1945 (73 y.o. M) Treating RN: Montey Hora Primary Care Joselyn Edling: [name] Other Clinician: Referring Ellaina Schuler: [name] Treating Ramsie Ostrander/Extender: STONE III, [name]: 29 Wound Status Wound Number: 22 Primary Lymphedema Etiology: Wound Location: Left Lower Leg - Circumferential Wound Open Wounding  Event: Gradually Appeared Status: Date Acquired: 10/25/2018 Comorbid Anemia, Lymphedema, Chronic Obstructive Weeks Of Treatment: 0 History: Pulmonary Disease (COPD), Sleep Apnea, Clustered Wound: No Congestive Heart Failure, Coronary Artery Disease, Hypertension, Peripheral Venous Disease, Type II Diabetes,  Osteoarthritis, Neuropathy, Received Chemotherapy Wound Measurements Length: (cm) 4.5 Width: (cm) 41 Depth: (cm) 0.1 Area: (cm) 144.906 Volume: (cm) 14.491 % Reduction in Area: 0% % Reduction in Volume: 0% Epithelialization: Medium (34-66%) Tunneling: No Undermining: No Wound Description Classification: Partial Thickness Wound Margin: Flat and Intact Exudate Amount: Large Exudate Type: Serous Exudate Color: amber Foul Odor After Cleansing: No Slough/Fibrino No Wound Bed Granulation Amount: Large (67-100%) Exposed Structure Granulation Quality: Pink Fascia Exposed: No Necrotic Amount: None Present (0%) Fat Layer (Subcutaneous Tissue) Exposed: No Tendon Exposed: No Muscle Exposed: No Joint Exposed: No Bone Exposed: No Limited to Skin Breakdown Treatment Notes Wound #22 (Left, Circumferential Lower Leg) Notes Silver alginate, xtrasorb, 4 layer wrap Electronic Signature(s) Signed: 11/04/2018 3:10:52 PM By: Montey Hora Entered By: Montey Hora on 11/04/2018 14:37:54

## 2018-11-06 NOTE — Progress Notes (Signed)
ADVAIT, BUICE ([id number]) Visit Report for 10/31/2018 Chief Complaint Document Details Patient Name: Adam Merritt, Adam Merritt Date of Service: 10/31/2018 1:45 PM Medical Record Number: [id number] Patient Account Number: [id number] Date of Birth/Sex: 1945-09-13 (73 y.o. M) Treating RN: Cornell Barman Primary Care Provider: [name] Other Clinician: Referring Provider: [name] Treating Provider/Extender: Melburn Hake, Jaydien Panepinto [name]: 50 Information Obtained from: Patient Chief Complaint he is here for evaluation of bilateral lower extremity lymphedema right right toe and left LE ulcers Electronic Signature(s) Signed: 10/31/2018 4:36:20 PM By: Worthy Keeler PA-C Entered By: Worthy Keeler on 10/31/2018 13:56:34 Adam Merritt ([id number]) -------------------------------------------------------------------------------- HPI Details Patient Name: Adam Merritt Date of Service: 10/31/2018 1:45 PM Medical Record Number: [id number] Patient Account Number: [id number] Date of Birth/Sex: 02/20/1946 (73 y.o. M) Treating RN: Cornell Barman Primary Care Provider: [name] Other Clinician: Referring Provider: [name] Treating Provider/Extender: Melburn Hake, Nilani Hugill [name]: 5 History of Present Illness HPI Description: 10/18/17-He is here for initial evaluation of bilateral lower extremity ulcerations in the presence of venous insufficiency and lymphedema. He has been seen by vascular medicine in the past, Dr. Lucky Cowboy, last seen in 2016. He does have a history of abnormal ABIs, which is to be expected given his lymphedema and venous insufficiency. According to Epic, it appears that all attempts for arterial evaluation and/or angiography were not follow through with by patient. He does have a history of being seen in lymphedema clinic in 2018, stopped going approximately 6 months ago stating "it didn't do any good". He does not have lymphedema pumps, he does not have custom fit compression  wrap/stockings. He is diabetic and his recent A1c last month was 7.6. He admits to chronic bilateral lower extremity pain, no change in pain since blister and ulceration development. He is currently being treated with Levaquin for bronchitis. He has home health and we will continue. 10/25/17-He is here in follow-up evaluation for bilateral lower extremity ulcerationssubtle he remains on Levaquin for bronchitis. Right lower extremity with no evidence of drainage or ulceration, persistent left lower extremity ulceration. He states that home health has not been out since his appointment. He went to [facility] on Tuesday, studies revealed: RIGHT ABI 0.9, TBI 0.6 LEFT ABI 1.1, TBI 0.6 with triphasic flow bilaterally. We will continue his same treatment plan. He has been educated on compression therapy and need for elevation. He will benefit from lymphedema pumps 11/01/17-He is here in follow-up evaluation for left lower extremity ulcer. The right lower extremity remains healed. He has home health services, but they have not been out to see the patient for 2-3 weeks. He states it home health physical therapy changed his dressing yesterday after therapy; he placed Ace wrap compression. We are still waiting for lymphedema pumps, reordered d/t need for company change. 11/08/17-He is here in follow-up evaluation for left lower extremity ulcer. It is improved. Edema is significantly improved with compression therapy. We will continue with same treatment plan and he will follow-up next week. No word regarding lymphedema pumps 11/15/17-He is here in follow-up evaluation for left lower extremity ulcer. He is healed and will be discharged from wound care services. I have reached out to [facility] regarding his lymphedema pumps. They have been unable to reach the patient; the contact number they had with the patient's wife's cell phone and she has not answered any unrecognized calls. Contact  should be made today, trial planned for next week; [facility] will continue to  follow 11/27/17 on evaluation today patient has multiple blistered areas over the right lower extremity his left lower extremity appears to be doing okay. These blistered areas show signs of no infection which is great news. With that being said he did have some necrotic skin overlying which was mechanically debrided away with saline and gauze today without complication. Overall post debridement the wounds appear to be doing better but in general his swelling seems to be increased. This is obviously not good news. I think this is what has given rise to the blisters. 12/04/17 on evaluation today patient presents for follow-up concerning his bilateral lower extremity edema in the right lower extremity ulcers. He has been tolerating the dressing changes without complication. With that being said he has had no real issues with the wraps which is also good news. Overall I'm pleased with the progress he's been making. 12/11/17 on evaluation today patient appears to be doing rather well in regard to his right lateral lower extremity ulcer. He's been tolerating the dressing changes without complication. Fortunately there does not appear to be any evidence of infection at this time. Overall I'm pleased with the progress that is being made. Unfortunately he has been in the hospital due to having what sounds to be a stomach virus/flu fortunately that is starting to get better. 12/18/17 on evaluation today patient actually appears to be doing very well in regard to his bilateral lower extremities the swelling is under fairly good control his lymphedema pumps are still not up and running quite yet. With that being said he does have several areas of opening noted as far as wounds are concerned mainly over the left lower extremity. With that being said I do believe once he gets lymphedema pumps this would least help you mention some the  fluid and preventing this from occurring. Hopefully that will be set up soon sleeves are Artie in place at his home he just waiting for the machine. 12/25/17 on evaluation today patient actually appears to be doing excellent in fact all of his ulcers appear to have resolved his Adam Merritt, Adam Merritt. ([id number]) legs appear very well. I do think he needs compression stockings we have discussed this and they are actually going to go to [facility] today to elastic therapy to get this fitted for him. I think that is definitely a good thing to do. Readmission: 04/09/18 upon evaluation today this patient is seen for readmission due to bilateral lower extremity lymphedema. He has significant swelling of his extremities especially on the left although the right is also swollen he has weeping from both sides. There are no obvious open wounds at this point. Fortunately he has been doing fairly well for quite a bit of time since I last saw him. Nonetheless unfortunately this seems to have reopened and is giving quite a bit of trouble. He states this began about a week ago when he first called [place] to get in to be seen. No fevers, chills, nausea, or vomiting noted at this time. He has not been using his lymphedema pumps due to the fact that they won't fit on his leg at this point likewise is also not been using his compression for essentially the same reason. 04/16/18 upon evaluation today patient actually appears to be doing a little better in regard to the fluid in his bilateral lower extremities. With that being said he's had three falls since I saw him last week. He also states that he's been feeling very poorly. I was  concerned last week and feel like that the concern is still there as far as the congestion in his chest is concerned he seems to be breathing about the same as last week but again he states he's very weak he's not even able to walk further than from the chair to the door. His wife had to buy a  wheelchair just to be able to get them out of the house to get to the appointment today. This has me very concerned. 04/23/18 on evaluation today patient actually appears to be doing much better than last week's evaluation. At that time actually had to transport him to the ER via EMS and he subsequently was admitted for acute pulmonary edema, acute renal failure, and acute congestive heart failure. Fortunately he is doing much better. Apparently they did dialyze him and were able to take off roughly 35 pounds of fluid. Nonetheless he is feeling much better both in regard to his breathing and he's able to get around much better at this time compared to previous. Overall I'm very happy with how things are at this time. There does not appear to be any evidence of infection currently. No fevers, chills, nausea, or vomiting noted at this time. 04/30/2018 patient seen today for follow-up and management of bilateral lower extremity lymphedema. He did express being more sad today than usual due to the recent loss of his dog. He states that he has been compliant with using the lymphedema pumps. However he does admit a minute over the last 2-3 days he has not been using the pumps due to the recent loss of his dog. At this time there is no drainage or open wounds to his lower extremities. The left leg edema is measuring smaller today. Still has a significant amount of edema on bilateral lower extremities With dry flaky skin. He will be referred to the lymphedema clinic for further management. Will continue 3 layer compression wraps and follow-up in 1-2 weeks.Denies any pain, fever, chairs recently. No recent falls or injuries reported during this visit. 05/07/18 on evaluation today patient actually appears to be doing very well in regard to his lower extremities in general all things considered. With that being said he is having some pain in the legs just due to the amount of swelling. He does have an area  where he had a blister on the left lateral lower extremity this is open at this point other than that there's nothing else weeping at this time. 05/14/18 on evaluation today patient actually appears to be doing excellent all things considered in regard to his lower extremities. He still has a couple areas of weeping on each leg which has continued to be the issue for him. He does have an appointment with the lymphedema clinic although this isn't until February 2020. That was the earliest they had. In the meantime he has continued to tolerate the compression wraps without complication. 05/28/18 on evaluation today patient actually appears to be doing more poorly in regard to his left lower extremity where he has a wound open at this point. He also had a fall where he subsequently injured his right great toe which has led to an open wound at the site unfortunately. He has been tolerating the dressing changes without complication in general as far as the wraps are concerned that he has not been putting any dressing on the left 1st toe ulcer site. 06/11/18 on evaluation today patient appears to be doing much worse in regard to his  bilateral lower extremity ulcers. He has been tolerating the dressing changes without complication although his legs have not been wrapped more recently. Overall I am not very pleased with the way his legs appear. I do believe he needs to be back in compression wraps he still has not received his compression wraps from the [facility] hospital as of yet. 06/18/18 on evaluation today patient actually appears to be doing significantly better than last time I saw him. He has been tolerating the compression wraps without complication in the circumferential ulcers especially appear to be doing much better. His toe ulcer on the right in regard to the great toe is better although not as good as the legs in my pinion. No fevers chills noted Adam Merritt, Adam Merritt ([id number]) 07/02/18 on evaluation  today patient appears to be doing much better in regard to his lower extremity ulcers. Unfortunately since I last saw him he's had the distal portion of his right great toe if he dated it sounds as if this actually went downhill very quickly. I had only seen him a few days prior and the toe did not appear to be infected at that point subsequently became infected very rapidly and it was decided by the surgeon that the distal portion of the toe needed to be removed. The patient seems to be doing well in this regard he tells me. With that being said his lower extremities are doing better from the standpoint of the wounds although he is significantly swollen at this point. 07/09/18 on evaluation today patient appears to be doing better in regard to the wounds on his lower extremities. In fact everything is almost completely healed he is just a small area on the left posterior lower extremity that is open at this point. 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 With that being said he needs some kind of compression it does sound like he may not be wearing his compression, that is the wraps, during the entire time between when he's here visit to visit. Apparently his wife took the current one off because it began to "fall apart". 07/16/18 on evaluation today patient appears to be extremely swollen especially in regard to his left lower extremity unfortunately. He also has a new skin tear over the left lower extremity and there's a smaller area on the right lower extremity as well. Unfortunately this seems to be due in part to blistering and fluid buildup in his leg. He did get the reduction wraps that were ordered by the [facility] hospital for him to go to lymphedema clinic. With that being said his wounds on the legs have not healed  to the point to where they would likely accept them as a patient lymphedema clinic currently. We need to try to get this to heal. With that being said he's been taking his wraps off which is not doing him any favors at this point. In fact this is probably quite counterproductive compared to what needs to occur. We will likely need to increase to a four layer compression wrap and continue to also utilize elevation and he has to keep the wraps on not take them off as he's been doing currently hasn't had a wrap on since Saturday. 07/23/18 on evaluation today patient appears to be doing much better in regard to his bilateral lower extremities.  In fact his left lower extremity which was the largest is actually 15 cm smaller today compared to what it was last time he was here in our clinic. This is obviously good news after just one week. Nonetheless the differences he actually kept the wraps on during the entire week this time. That's not typical for him. I do believe he understands a little bit better now the severity of the situation and why it's important for him to keep these wraps on. 07/30/18 on evaluation today patient actually appears to be doing rather well in regard to his lower extremities. His legs are much smaller than they have been in the past and he actually has only one very small rudder superficial region remaining that is not closed on the left lateral/posterior lower extremity even this is almost completely close. I do believe likely next week he will be healed without any complications. I do think we need to continue the wraps however this seems to be beneficial for him. I also think it may be a good time for [place] to go ahead and see about getting the appointment with the lymphedema clinic which is supposed to be made for him in order to keep this moving along and hopefully get them into compression wraps that will in the end help him to remain healed. 08/06/18 on evaluation today patient  actually appears to be doing very well in regard as bilateral lower extremities. In fact his wounds appear to be completely healed at this time. 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 He is in the process of getting appointment with the lymphedema clinic we have made this referral were just waiting to hear back on the schedule time. We need to follow up on that today as well. 08/13/18 on evaluation today patient actually appears to be doing very well in regard to his bilateral lower extremities there are no open wounds at this point. We are gonna go ahead and see about ordering the Velcro compression wraps for him had a discussion with them about [place] doing it versus the [place] they feel like they can definitely afford going ahead and get the wraps themselves and they would prefer to try to avoid having to go to the lymphedema clinic if it all possible which I completely understand. As long as he has good compression I'm okay either way. 3/17//20 on evaluation today patient actually appears to be doing well in regard to his bilateral lower extremity ulcers. He has been tolerating the dressing changes without complication specifically the compression wraps. Overall is had no issues my fingers finding that I see at this point is that he is having trouble with constipation. He tells me he has not been able to go to the bathroom for about six days. He's taken to over-the-counter oral laxatives unfortunately this is not helping. He has not contacted his doctor. 08/27/18 on evaluation today patient appears to be doing fairly well in regard to his lower extremities at this point. There does not appear to be any new altars is swelling is very well controlled. We are still waiting for his Velcro compression wraps to arrive that should be sometime in the next week is Artie sent the check for these. 09/03/18 on evaluation today patient appears to be doing  excellent in regard to his bilateral lower extremities which he shows Adam Merritt, Adam Merritt. ([id number]) no signs of wound openings in regard to this point. He does have  his Velcro compression wraps which did arrive in the mail since I saw him last week. Overall he is doing excellent in my pinion. 09/13/18 on evaluation today patient appears to be doing very well currently in regard to the overall appearance of his bilateral lower extremities although he's a little bit more swollen than last time we saw him. At that point he been discharged without any open wounds. Nonetheless he has a small open wound on the posterior left lower extremity with some evidence of cellulitis noted as well. Fortunately I feel like he has made good progress overall with regard to his lower extremities from were things used to be. 09/20/18 on evaluation today patient actually appears to be doing much better. The erythematous lower extremity is improving wound itself which is still open appears to be doing much better as far as but appearance as well as pain is concerned overall very pleased in this regard. There's no signs of active infection at this time. 09/27/18 on evaluation today patient's wounds on the lower extremity actually appear to be doing fairly well at this time which is good news. There is no evidence of active infection currently and again is just as left lower extremity were there any wounds at this point anyway. I believe they may be completely healed but again I'm not 100% sure based on evaluation today. I think one more week of observation would likely be a good idea. 10/04/18 on evaluation today patient actually appears to be doing excellent in regard to the left lower extremity which actually appears to be completely healed as of today. Unfortunately he's been having issues with his right lower extremity have a new wound that has opened. Fortunately there's no evidence of active infection at this time which is  good news. 10/10/18 upon evaluation today patient actually appears to be doing a little bit worse with the new open area on his right posterior lower extremity. He's been tolerating the dressing changes without complication. Right now we been using his compression wraps although I think we may need to switch back to actually performing bilateral compression wraps are in the clinic. No fevers, chills, nausea, or vomiting noted at this time. 10/17/18 on evaluation today patient actually appears to be doing quite well in regard to his bilateral lower extremity ulcers. He has been tolerating the reps without complication although he would prefer not to rewrap his legs as of today. Fortunately there's no signs of active infection which is good news. No fevers, chills, nausea, or vomiting noted at this time. 10/24/18 on evaluation today patient appears to be doing very well in regard to his lower extremities. His right lower extremity is shown signs of healing and his left lower [place] though not healed appears to be improving which is excellent news. Overall very pleased with how things seem to be progressing at this time. The patient is likewise happy to hear this. His Velcro compression wraps however have not been put on properly will gonna show his wife how to do that properly today. 10/31/18 on evaluation today patient appears to be doing more poorly in regard to his left lower extremity in particular although both lower extremities actually are showing some signs of being worse than my previous evaluation. Unfortunately I'm just not sure that his compression stockings even with the use of the compression/lymphedema pumps seem to be controlling this well. Upon further questioning he tells me that he also is not able to lie flat in the bed  due to his congestive heart failure and difficulty breathing. For that reason he sleeps in his recliner. To make matters worse his recliner also cannot even hold  his legs up so instead of even being somewhat elevated they pretty much hang down to the floor. This is the way he sleeps each night which is definitely counterproductive to everything else that were attempting to do from the standpoint of controlling his fluid. Nonetheless I think that he potentially could benefit from a hospital bed although this would be something that his primary care provider would likely have to order since anything that is order on our side has to be directly related to wound care and again the hospital bed is not necessarily a direct relation to although I think it does contribute to his overall wound status on his lower extremities. Electronic Signature(s) Signed: 10/31/2018 4:36:20 PM By: Worthy Keeler PA-C Entered By: Worthy Keeler on 10/31/2018 14:06:38 Adam Merritt ([id number]) -------------------------------------------------------------------------------- Physical Exam Details Patient Name: Adam Merritt Date of Service: 10/31/2018 1:45 PM Medical Record Number: [id number] Patient Account Number: [id number] Date of Birth/Sex: 1945-12-27 (73 y.o. M) Treating RN: Cornell Barman Primary Care Provider: [name] Other Clinician: Referring Provider: [name] Treating Provider/Extender: Melburn Hake, Radley Barto [name]: 62 Constitutional Obese and well-hydrated in no acute distress. Respiratory normal breathing without difficulty. clear to auscultation bilaterally. Cardiovascular regular rate and rhythm with normal S1, S2. 2+ pitting edema of the bilateral lower extremities. Psychiatric this patient is able to make decisions and demonstrates good insight into disease process. Alert and Oriented x 3. pleasant and cooperative. Notes Upon inspection today patient has a much larger area of open wound and weeping on the left lower extremity when compared to the right. This is significantly worse than even the last time I saw him. I'm becoming more  concerned about the possibility of cellulitis if we don't get this closed. I think that is best bet for getting this closed is going to be for [place] to utilize the three layer compression wraps which is done very well with in the past. Electronic Signature(s) Signed: 10/31/2018 4:36:20 PM By: Worthy Keeler PA-C Entered By: Worthy Keeler on 10/31/2018 14:07:20 Adam Merritt ([id number]) -------------------------------------------------------------------------------- Physician Orders Details Patient Name: Adam Merritt Date of Service: 10/31/2018 1:45 PM Medical Record Number: [id number] Patient Account Number: [id number] Date of Birth/Sex: 10/15/1945 (73 y.o. 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 Wound #21 Right,Anterior Lower Leg o Cleanse wound with mild soap and water o May Shower, gently pat wound dry prior to applying new dressing. Wound #22 Left,Circumferential Lower Leg o Cleanse wound with mild soap and water o May Shower, gently pat wound dry prior to applying new dressing. Skin Barriers/Peri-Wound Care Wound #20 Right,Posterior Lower Leg o Barrier cream Wound #21 Right,Anterior Lower Leg o Barrier cream Wound #22 Left,Circumferential Lower  Leg o Barrier  cream Primary Wound Dressing Wound #20 Right,Posterior Lower Leg o Silver Alginate Wound #21 Right,Anterior Lower Leg o Silver Alginate Secondary Dressing Wound #20 Right,Posterior Lower Leg o VASH, QUEZADA ([id number]) Wound #21 Right,Anterior Lower Leg o XtraSorb Wound #22 Left,Circumferential Lower Leg o XtraSorb Dressing Change Frequency Wound #20 Right,Posterior Lower Leg o Dressing is to be changed Monday and Thursday. Wound #21 Right,Anterior Lower Leg o Dressing is to be changed Monday and Thursday. Wound #22 Left,Circumferential Lower Leg o Dressing is to be changed Monday and Thursday. Follow-up Appointments Wound #20 Right,Posterior Lower Leg o Return Appointment in 1 week. - Monday and Thursday o Nurse Visit as needed Wound #21 Right,Anterior Lower Leg o Return Appointment in 1 week. - Monday and Thursday o Nurse Visit as needed Wound #22 Left,Circumferential Lower Leg o Return Appointment in 1 week. - Monday and Thursday o Nurse Visit as needed Edema Control Wound #20 Right,Posterior Lower Leg o 4 Layer Compression System - Bilateral o Elevate legs to the level of the heart and pump ankles as often as possible Wound #21 Right,Anterior Lower Leg o 4 Layer Compression System - Bilateral o Elevate legs to the level of the heart and pump ankles as often as possible Wound #22 Left,Circumferential Lower Leg o 4 Layer Compression System - Bilateral o Elevate legs to the level of the heart and pump ankles as often as possible Additional Orders / Instructions Wound #20 Right,Posterior Lower Leg o Other: - Talk with PCP about getting a hospital bed to sleep in. Wound #21 Right,Anterior Lower Leg o Other: - Talk with PCP about getting a hospital bed to sleep in. Wound #22 Left,Circumferential Lower Leg o Other: - Talk with PCP about getting a hospital bed to sleep in. Electronic Signature(s) Adam Merritt, Adam Merritt  ([id number]) Signed: 11/04/2018 3:10:52 PM By: Montey Hora Signed: 11/06/2018 2:50:08 AM By: Worthy Keeler PA-C Previous Signature: 10/31/2018 4:36:20 PM Version By: Worthy Keeler PA-C Entered By: Montey Hora on 11/04/2018 15:09:06 Adam Merritt ([id number]) -------------------------------------------------------------------------------- Problem List Details Patient Name: Adam Merritt Date of Service: 10/31/2018 1:45 PM Medical Record Number: [id number] Patient Account Number: [id number] Date of Birth/Sex: 1945-12-26 (73 y.o. M) Treating RN: Cornell Barman Primary Care Provider: [name] Other Clinician: Referring Provider: [name] Treating Provider/Extender: Melburn Hake, Swain Acree [name]: 29 Active Problems ICD-10 Evaluated Encounter Code Description Active Date Today Diagnosis I89.0 Lymphedema, not elsewhere classified 04/09/2018 No Yes L97.512 Non-pressure chronic ulcer of other part of right foot with fat 05/28/2018 No Yes layer exposed L97.822 Non-pressure chronic ulcer of other part of left lower leg with 05/28/2018 No Yes fat layer exposed L98.492 Non-pressure chronic ulcer of skin of other sites with fat layer 06/11/2018 No Yes exposed E66.9 Obesity, unspecified 04/09/2018 No Yes Inactive Problems Resolved Problems ICD-10 Code Description Active Date Resolved Date L03.115 Cellulitis of right lower limb 04/09/2018 04/09/2018 L03.116 Cellulitis of left lower limb 04/09/2018 04/09/2018 Electronic Signature(s) Signed: 10/31/2018 4:36:20 PM By: Worthy Keeler PA-C Entered By: Worthy Keeler on 10/31/2018 13:56:28 DEAIRE, MCWHIRTER ([id number]) VELTON, ROSELLE ([id number]) -------------------------------------------------------------------------------- Progress Note Details Patient Name: Adam Merritt Date of Service: 10/31/2018 1:45 PM Medical Record Number: [id number] Patient Account Number: [id number] Date of Birth/Sex: 08-29-1945 (73 y.o. M) Treating RN: Cornell Barman Primary Care Provider: [name] Other Clinician: Referring Provider: [name] Treating Provider/Extender: Melburn Hake, Deni Berti [name]: 50 Subjective Chief Complaint Information obtained from Patient he is here for  evaluation of bilateral lower extremity lymphedema right right toe and left LE ulcers History of Present Illness (HPI) 10/18/17-He is here for initial evaluation of bilateral lower extremity ulcerations in the presence of venous insufficiency and lymphedema. He has been seen by vascular medicine in the past, Dr. Lucky Cowboy, last seen in 2016. He does have a history of abnormal ABIs, which is to be expected given his lymphedema and venous insufficiency. According to Epic, it appears that all attempts for arterial evaluation and/or angiography were not follow through with by patient. He does have a history of being seen in lymphedema clinic in 2018, stopped going approximately 6 months ago stating "it didn't do any good". He does not have lymphedema pumps, he does not have custom fit compression wrap/stockings. He is diabetic and his recent A1c last month was 7.6. He admits to chronic bilateral lower extremity pain, no change in pain since blister and ulceration development. He is currently being treated with Levaquin for bronchitis. He has home health and we will continue. 10/25/17-He is here in follow-up evaluation for bilateral lower extremity ulcerationssubtle he remains on Levaquin for bronchitis. Right lower extremity with no evidence of drainage or ulceration, persistent left lower extremity ulceration. He states that home health has not been out since his appointment. He went to [facility] on Tuesday, studies revealed: RIGHT ABI 0.9, TBI 0.6 LEFT ABI 1.1, TBI 0.6 with triphasic flow bilaterally. We will continue his same treatment plan. He has been educated on compression therapy and need for elevation. He will benefit from lymphedema pumps 11/01/17-He is  here in follow-up evaluation for left lower extremity ulcer. The right lower extremity remains healed. He has home health services, but they have not been out to see the patient for 2-3 weeks. He states it home health physical therapy changed his dressing yesterday after therapy; he placed Ace wrap compression. We are still waiting for lymphedema pumps, reordered d/t need for company change. 11/08/17-He is here in follow-up evaluation for left lower extremity ulcer. It is improved. Edema is significantly improved with compression therapy. We will continue with same treatment plan and he will follow-up next week. No word regarding lymphedema pumps 11/15/17-He is here in follow-up evaluation for left lower extremity ulcer. He is healed and will be discharged from wound care services. I have reached out to [facility] regarding his lymphedema pumps. They have been unable to reach the patient; the contact number they had with the patient's wife's cell phone and she has not answered any unrecognized calls. Contact should be made today, trial planned for next week; [facility] will continue to follow 11/27/17 on evaluation today patient has multiple blistered areas over the right lower extremity his left lower extremity appears to be doing okay. These blistered areas show signs of no infection which is great news. With that being said he did have some necrotic skin overlying which was mechanically debrided away with saline and gauze today without complication. Overall post debridement the wounds appear to be doing better but in general his swelling seems to be increased. This is obviously not good news. I think this is what has given rise to the blisters. 12/04/17 on evaluation today patient presents for follow-up concerning his bilateral lower extremity edema in the right lower extremity ulcers. He has been tolerating the dressing changes without complication. With that being said he has had no  real issues with the wraps which is also good news. Overall I'm pleased with the progress he's  been making. 12/11/17 on evaluation today patient appears to be doing rather well in regard to his right lateral lower extremity ulcer. He's been tolerating the dressing changes without complication. Fortunately there does not appear to be any evidence of infection at this time. Overall I'm pleased with the progress that is being made. Unfortunately he has been in the hospital due to having what sounds to be a stomach virus/flu fortunately that is starting to get better. 12/18/17 on evaluation today patient actually appears to be doing very well in regard to his bilateral lower extremities the TYEE, VANDEVOORDE. ([id number]) swelling is under fairly good control his lymphedema pumps are still not up and running quite yet. With that being said he does have several areas of opening noted as far as wounds are concerned mainly over the left lower extremity. With that being said I do believe once he gets lymphedema pumps this would least help you mention some the fluid and preventing this from occurring. Hopefully that will be set up soon sleeves are Artie in place at his home he just waiting for the machine. 12/25/17 on evaluation today patient actually appears to be doing excellent in fact all of his ulcers appear to have resolved his legs appear very well. I do think he needs compression stockings we have discussed this and they are actually going to go to [facility] today to elastic therapy to get this fitted for him. I think that is definitely a good thing to do. Readmission: 04/09/18 upon evaluation today this patient is seen for readmission due to bilateral lower extremity lymphedema. He has significant swelling of his extremities especially on the left although the right is also swollen he has weeping from both sides. There are no obvious open wounds at this point. Fortunately he has been doing fairly well for  quite a bit of time since I last saw him. Nonetheless unfortunately this seems to have reopened and is giving quite a bit of trouble. He states this began about a week ago when he first called [place] to get in to be seen. No fevers, chills, nausea, or vomiting noted at this time. He has not been using his lymphedema pumps due to the fact that they won't fit on his leg at this point likewise is also not been using his compression for essentially the same reason. 04/16/18 upon evaluation today patient actually appears to be doing a little better in regard to the fluid in his bilateral lower extremities. With that being said he's had three falls since I saw him last week. He also states that he's been feeling very poorly. I was concerned last week and feel like that the concern is still there as far as the congestion in his chest is concerned he seems to be breathing about the same as last week but again he states he's very weak he's not even able to walk further than from the chair to the door. His wife had to buy a wheelchair just to be able to get them out of the house to get to the appointment today. This has me very concerned. 04/23/18 on evaluation today patient actually appears to be doing much better than last week's evaluation. At that time actually had to transport him to the ER via EMS and he subsequently was admitted for acute pulmonary edema, acute renal failure, and acute congestive heart failure. Fortunately he is doing much better. Apparently they did dialyze him and were able to take off roughly 35 pounds  of fluid. Nonetheless he is feeling much better both in regard to his breathing and he's able to get around much better at this time compared to previous. Overall I'm very happy with how things are at this time. There does not appear to be any evidence of infection currently. No fevers, chills, nausea, or vomiting noted at this time. 04/30/2018 patient seen today for follow-up and  management of bilateral lower extremity lymphedema. He did express being more sad today than usual due to the recent loss of his dog. He states that he has been compliant with using the lymphedema pumps. However he does admit a minute over the last 2-3 days he has not been using the pumps due to the recent loss of his dog. At this time there is no drainage or open wounds to his lower extremities. The left leg edema is measuring smaller today. Still has a significant amount of edema on bilateral lower extremities With dry flaky skin. He will be referred to the lymphedema clinic for further management. Will continue 3 layer compression wraps and follow-up in 1-2 weeks.Denies any pain, fever, chairs recently. No recent falls or injuries reported during this visit. 05/07/18 on evaluation today patient actually appears to be doing very well in regard to his lower extremities in general all things considered. With that being said he is having some pain in the legs just due to the amount of swelling. He does have an area where he had a blister on the left lateral lower extremity this is open at this point other than that there's nothing else weeping at this time. 05/14/18 on evaluation today patient actually appears to be doing excellent all things considered in regard to his lower extremities. He still has a couple areas of weeping on each leg which has continued to be the issue for him. He does have an appointment with the lymphedema clinic although this isn't until February 2020. That was the earliest they had. In the meantime he has continued to tolerate the compression wraps without complication. 05/28/18 on evaluation today patient actually appears to be doing more poorly in regard to his left lower extremity where he has a wound open at this point. He also had a fall where he subsequently injured his right great toe which has led to an open wound at the site unfortunately. He has been tolerating the  dressing changes without complication in general as far as the wraps are concerned that he has not been putting any dressing on the left 1st toe ulcer site. 06/11/18 on evaluation today patient appears to be doing much worse in regard to his bilateral lower extremity ulcers. He has been tolerating the dressing changes without complication although his legs have not been wrapped more recently. Overall I am not very pleased with the way his legs appear. I do believe he needs to be back in compression wraps he still has not received his compression wraps from the [facility] hospital as of yet. Adam Merritt, Adam Merritt ([id number]) 06/18/18 on evaluation today patient actually appears to be doing significantly better than last time I saw him. He has been tolerating the compression wraps without complication in the circumferential ulcers especially appear to be doing much better. His toe ulcer on the right in regard to the great toe is better although not as good as the legs in my pinion. No fevers chills noted 07/02/18 on evaluation today patient appears to be doing much better in regard to his lower extremity ulcers. Unfortunately  since I last saw him he's had the distal portion of his right great toe if he dated it sounds as if this actually went downhill very quickly. I had only seen him a few days prior and the toe did not appear to be infected at that point subsequently became infected very rapidly and it was decided by the surgeon that the distal portion of the toe needed to be removed. The patient seems to be doing well in this regard he tells me. With that being said his lower extremities are doing better from the standpoint of the wounds although he is significantly swollen at this point. 07/09/18 on evaluation today patient appears to be doing better in regard to the wounds on his lower extremities. In fact everything is almost completely healed he is just a small area on the left posterior lower extremity that is  open at this point. 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 With that being said he needs some kind of compression it does sound like he may not be wearing his compression, that is the wraps, during the entire time between when he's here visit to visit. Apparently his wife took the current one off because it began to "fall apart". 07/16/18 on evaluation today patient appears to be extremely swollen especially in regard to his left lower extremity unfortunately. He also has a new skin tear over the left lower extremity and there's a smaller area on the right lower extremity as well. Unfortunately this seems to be due in part to blistering and fluid buildup in his leg. He did get the reduction wraps that were ordered by the [facility] hospital for him to go to lymphedema clinic. With that being said his wounds on the legs have not healed to the point to where they would likely accept them as a patient lymphedema clinic currently. We need to try to get this to heal. With that being said he's been taking his wraps off which is not doing him any favors at this point. In fact this is probably quite counterproductive compared to what needs to occur. We will likely need to increase to a four layer compression wrap and continue to also utilize elevation and he has to keep the wraps on not take them off as he's been doing currently hasn't had a wrap on since Saturday. 07/23/18 on evaluation today patient appears to be doing much better in regard to his bilateral lower extremities. In fact his left lower extremity which was the largest is actually 15 cm smaller today compared to what it was last time he was here in our clinic. This is obviously good news after just one week. Nonetheless the differences he actually kept the wraps on  during the entire week this time. That's not typical for him. I do believe he understands a little bit better now the severity of the situation and why it's important for him to keep these wraps on. 07/30/18 on evaluation today patient actually appears to be doing rather well in regard to his lower extremities. His legs are much smaller than they have been in the past and he actually has only one very small rudder superficial region remaining that is not closed on the left lateral/posterior lower extremity even this is almost completely close. I do believe likely next week he  will be healed without any complications. I do think we need to continue the wraps however this seems to be beneficial for him. I also think it may be a good time for [place] to go ahead and see about getting the appointment with the lymphedema clinic which is supposed to be made for him in order to keep this moving along and hopefully get them into compression wraps that will in the end help him to remain healed. 08/06/18 on evaluation today patient actually appears to be doing very well in regard as bilateral lower extremities. In fact his wounds appear to be completely healed at this time. 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 He is in the process of getting appointment with the lymphedema clinic we have made this referral were just waiting to hear back on the schedule time. We need to follow up on that today as well. 08/13/18 on evaluation today patient actually appears to be doing very well in regard to his bilateral lower extremities there are no open wounds at this point. We are gonna go ahead and see about ordering the Velcro compression wraps for him had a discussion with them about [place] doing it versus the [place] they feel like they can definitely afford going ahead and get the wraps themselves and they would prefer to try to avoid having to go to the lymphedema clinic  if it all possible which I completely understand. As long as he has good compression I'm okay either way. 3/17//20 on evaluation today patient actually appears to be doing well in regard to his bilateral lower extremity ulcers. He has been tolerating the dressing changes without complication specifically the compression wraps. Overall is had no issues my fingers finding that I see at this point is that he is having trouble with constipation. He tells me he has not been able to go to the bathroom for about six days. He's taken to over-the-counter oral laxatives unfortunately this is not helping. He has not contacted his doctor. Adam Merritt, Adam Merritt ([id number]) 08/27/18 on evaluation today patient appears to be doing fairly well in regard to his lower extremities at this point. There does not appear to be any new altars is swelling is very well controlled. We are still waiting for his Velcro compression wraps to arrive that should be sometime in the next week is Artie sent the check for these. 09/03/18 on evaluation today patient appears to be doing excellent in regard to his bilateral lower extremities which he shows no signs of wound openings in regard to this point. He does have his Velcro compression wraps which did arrive in the mail since I saw him last week. Overall he is doing excellent in my pinion. 09/13/18 on evaluation today patient appears to be doing very well currently in regard to the overall appearance of his bilateral lower extremities although he's a little bit more swollen than last time we saw him. At that point he been discharged without any open wounds. Nonetheless he has a small open wound on the posterior left lower extremity with some evidence of cellulitis noted as well. Fortunately I feel like he has made good progress overall with regard to his lower extremities from were things used to be. 09/20/18 on evaluation today patient actually appears to be doing much better. The  erythematous lower extremity is improving wound itself which is still open appears to be doing much better as far as but appearance as  well as pain is concerned overall very pleased in this regard. There's no signs of active infection at this time. 09/27/18 on evaluation today patient's wounds on the lower extremity actually appear to be doing fairly well at this time which is good news. There is no evidence of active infection currently and again is just as left lower extremity were there any wounds at this point anyway. I believe they may be completely healed but again I'm not 100% sure based on evaluation today. I think one more week of observation would likely be a good idea. 10/04/18 on evaluation today patient actually appears to be doing excellent in regard to the left lower extremity which actually appears to be completely healed as of today. Unfortunately he's been having issues with his right lower extremity have a new wound that has opened. Fortunately there's no evidence of active infection at this time which is good news. 10/10/18 upon evaluation today patient actually appears to be doing a little bit worse with the new open area on his right posterior lower extremity. He's been tolerating the dressing changes without complication. Right now we been using his compression wraps although I think we may need to switch back to actually performing bilateral compression wraps are in the clinic. No fevers, chills, nausea, or vomiting noted at this time. 10/17/18 on evaluation today patient actually appears to be doing quite well in regard to his bilateral lower extremity ulcers. He has been tolerating the reps without complication although he would prefer not to rewrap his legs as of today. Fortunately there's no signs of active infection which is good news. No fevers, chills, nausea, or vomiting noted at this time. 10/24/18 on evaluation today patient appears to be doing very well in regard to his  lower extremities. His right lower extremity is shown signs of healing and his left lower Trinity though not healed appears to be improving which is excellent news. Overall very pleased with how things seem to be progressing at this time. The patient is likewise happy to hear this. His Velcro compression wraps however have not been put on properly will gonna show his wife how to do that properly today. 10/31/18 on evaluation today patient appears to be doing more poorly in regard to his left lower extremity in particular although both lower extremities actually are showing some signs of being worse than my previous evaluation. Unfortunately I'm just not sure that his compression stockings even with the use of the compression/lymphedema pumps seem to be controlling this well. Upon further questioning he tells me that he also is not able to lie flat in the bed due to his congestive heart failure and difficulty breathing. For that reason he sleeps in his recliner. To make matters worse his recliner also cannot even hold his legs up so instead of even being somewhat elevated they pretty much hang down to the floor. This is the way he sleeps each night which is definitely counterproductive to everything else that were attempting to do from the standpoint of controlling his fluid. Nonetheless I think that he potentially could benefit from a hospital bed although this would be something that his primary care provider would likely have to order since anything that is order on our side has to be directly related to wound care and again the hospital bed is not necessarily a direct relation to although I think it does contribute to his overall wound status on his lower extremities. Patient History Information obtained from Patient. Family  History Cancer - Paternal Grandparents, Kidney Disease - Siblings, Lung Disease - Mother, MCDONALD, REILING ([id number]) No family history of Diabetes, Heart Disease,  Hereditary Spherocytosis, Hypertension, Seizures, Stroke, Thyroid Problems, Tuberculosis. Social History Never smoker, Marital Status - Married, Alcohol Use - Never, Drug Use - No History, Caffeine Use - Never. Medical History Eyes Denies history of Cataracts, Glaucoma, Optic Neuritis Ear/Nose/Mouth/Throat Denies history of Chronic sinus problems/congestion, Middle ear problems Hematologic/Lymphatic Patient has history of Anemia - aplastic anemia, Lymphedema Denies history of Hemophilia, Human Immunodeficiency Virus, Sickle Cell Disease Respiratory Patient has history of Chronic Obstructive Pulmonary Disease (COPD), Sleep Apnea Denies history of Aspiration, Asthma, Pneumothorax, Tuberculosis Cardiovascular Patient has history of Congestive Heart Failure, Coronary Artery Disease, Hypertension, Peripheral Venous Disease Denies history of Angina, Arrhythmia, Deep Vein Thrombosis, Hypotension, Myocardial Infarction, Peripheral Arterial Disease, Phlebitis, Vasculitis Gastrointestinal Denies history of Cirrhosis , Colitis, Crohn s, Hepatitis A, Hepatitis B, Hepatitis C Endocrine Patient has history of Type II Diabetes Denies history of Type I Diabetes Genitourinary Denies history of End Stage Renal Disease Immunological Denies history of Lupus Erythematosus, Raynaud s, Scleroderma Integumentary (Skin) Denies history of History of Burn, History of pressure wounds Musculoskeletal Patient has history of Osteoarthritis Denies history of Gout, Rheumatoid Arthritis, Osteomyelitis Neurologic Patient has history of Neuropathy Denies history of Dementia, Quadriplegia, Paraplegia, Seizure Disorder Oncologic Patient has history of Received Chemotherapy - last dose 2006 Denies history of Received Radiation Psychiatric Denies history of Anorexia/bulimia, Confinement Anxiety Hospitalization/Surgery History - bronchitis. Medical And Surgical History Notes Respiratory home O2 at night as  needed Cardiovascular varicose veins Neurologic CVA - 1992 Review of Systems (ROS) Constitutional Symptoms (General Health) Denies complaints or symptoms of Fatigue, Fever, Chills, Marked Weight Change. Respiratory Denies complaints or symptoms of Chronic or frequent coughs, Shortness of Breath. Cardiovascular Adam Merritt, Adam Merritt ([id number]) Complains or has symptoms of LE edema. Denies complaints or symptoms of Chest pain. Psychiatric Denies complaints or symptoms of Anxiety, Claustrophobia. Objective Constitutional Obese and well-hydrated in no acute distress. Vitals Time Taken: 1:28 PM, Height: 66 in, Weight: 323 lbs, BMI: 52.1, Temperature: 98.3 F, Pulse: 68 bpm, Respiratory Rate: 18 breaths/min, Blood Pressure: 155/77 mmHg. Respiratory normal breathing without difficulty. clear to auscultation bilaterally. Cardiovascular regular rate and rhythm with normal S1, S2. 2+ pitting edema of the bilateral lower extremities. Psychiatric this patient is able to make decisions and demonstrates good insight into disease process. Alert and Oriented x 3. pleasant and cooperative. General Notes: Upon inspection today patient has a much larger area of open wound and weeping on the left lower extremity when compared to the right. This is significantly worse than even the last time I saw him. I'm becoming more concerned about the possibility of cellulitis if we don't get this closed. I think that is best bet for getting this closed is going to be for [place] to utilize the three layer compression wraps which is done very well with in the past. Integumentary (Hair, Skin) Wound #20 status is Open. Original cause of wound was Gradually Appeared. The wound is located on the Right,Posterior Lower Leg. The wound measures 1.5cm length x 1.8cm width x 0.1cm depth; 2.121cm^2 area and 0.212cm^3 volume. There is Fat Layer (Subcutaneous Tissue) Exposed exposed. There is no tunneling or undermining noted. There is  a medium amount of serous drainage noted. The wound margin is flat and intact. There is large (67-100%) pink granulation within the wound bed. There is no necrotic tissue within the wound bed. Wound #21 status is  Open. Original cause of wound was Gradually Appeared. The wound is located on the Right,Anterior Lower Leg. The wound measures 2.3cm length x 3cm width x 0.1cm depth; 5.419cm^2 area and 0.542cm^3 volume. The wound is limited to skin breakdown. There is no tunneling or undermining noted. There is a large amount of serous drainage noted. The wound margin is flat and intact. There is large (67-100%) red granulation within the wound bed. There is no necrotic tissue within the wound bed. Wound #22 status is Open. Original cause of wound was Gradually Appeared. The wound is located on the Left,Circumferential Lower Leg. The wound measures 4.5cm length x 41cm width x 0.1cm depth; 144.906cm^2 area and 14.491cm^3 volume. The wound is limited to skin breakdown. There is no tunneling or undermining noted. There is a large amount of serous drainage noted. The wound margin is flat and intact. There is large (67-100%) pink granulation within the wound bed. There is no necrotic tissue within the wound bed. 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 There was a Three Layer Compression Therapy Procedure by Cornell Barman, RN. Post procedure Diagnosis Wound #20: Same as Pre-Procedure Wound #21 Pre-procedure diagnosis of Wound #21 is a Lymphedema  located on the Right,Anterior Lower Leg . There was a Three Layer Compression Therapy Procedure by Cornell Barman, RN. Post procedure Diagnosis Wound #21: Same as Pre-Procedure Wound #22 Pre-procedure diagnosis of Wound #22 is a Lymphedema located on the Left,Circumferential Lower Leg . There was a Three Layer Compression Therapy Procedure by Cornell Barman, RN. Post procedure Diagnosis Wound #22: Same as Pre-Procedure Plan Wound Cleansing: Wound #20 Right,Posterior Lower Leg: Cleanse wound with mild soap and water May Shower, gently pat wound dry prior to applying new dressing. Wound #21 Right,Anterior Lower Leg: Cleanse wound with mild soap and water May Shower, gently pat wound dry prior to applying new dressing. Wound #22 Left,Circumferential Lower Leg: Cleanse wound with mild soap and water May Shower, gently pat wound dry prior to applying new dressing. Skin Barriers/Peri-Wound Care: Wound #20 Right,Posterior Lower Leg: Barrier cream Wound #21 Right,Anterior Lower Leg: Adam Merritt, Adam Merritt ([id number]) Barrier cream Wound #22 Left,Circumferential Lower Leg: Barrier cream Primary Wound Dressing: Wound #20 Right,Posterior Lower Leg: Silver Alginate Wound #21 Right,Anterior Lower Leg: Silver Alginate Wound #22 Left,Circumferential Lower Leg: Silver Alginate Secondary Dressing: Wound #20 Right,Posterior Lower Leg: XtraSorb Wound #21 Right,Anterior Lower Leg: XtraSorb Wound #22 Left,Circumferential Lower Leg: XtraSorb Dressing Change Frequency: Wound #20 Right,Posterior Lower Leg: Dressing is to be changed Monday and Thursday. Wound #21 Right,Anterior Lower Leg: Dressing is to be changed Monday and Thursday. Wound #22 Left,Circumferential Lower Leg: Dressing is to be changed Monday and Thursday. Follow-up Appointments: Wound #20 Right,Posterior Lower Leg: Return Appointment in 1 week. - Monday and Thursday Nurse Visit as needed Wound #21 Right,Anterior Lower Leg: Return  Appointment in 1 week. - Monday and Thursday Nurse Visit as needed Wound #22 Left,Circumferential Lower Leg: Return Appointment in 1 week. - Monday and Thursday Nurse Visit as needed Edema Control: Wound #20 Right,Posterior Lower Leg: 3 Layer Compression System - Bilateral Elevate legs to the level  of the heart and pump ankles as often as possible Compression Pump: Use compression pump on left lower extremity for 30 minutes, twice daily. Compression Pump: Use compression pump on right lower extremity for 30 minutes, twice daily. Wound #21 Right,Anterior Lower Leg: 3 Layer Compression System - Bilateral Elevate legs to the level of the heart and pump ankles as often as possible Compression Pump: Use compression pump on left lower extremity for 30 minutes, twice daily. Compression Pump: Use compression pump on right lower extremity for 30 minutes, twice daily. Wound #22 Left,Circumferential Lower Leg: 3 Layer Compression System - Bilateral Elevate legs to the level of the heart and pump ankles as often as possible Compression Pump: Use compression pump on left lower extremity for 30 minutes, twice daily. Compression Pump: Use compression pump on right lower extremity for 30 minutes, twice daily. Additional Orders / Instructions: Wound #20 Right,Posterior Lower Leg: Other: - Talk with PCP about getting a hospital bed to sleep in. Wound #21 Right,Anterior Lower Leg: Other: - Talk with PCP about getting a hospital bed to sleep in. Wound #22 Left,Circumferential Lower Leg: Other: - Talk with PCP about getting a hospital bed to sleep in. Adam Merritt, OLDAKER. ([id number]) I'm gonna recommend that we go ahead and initiate the above wound care measures for the next week and the patient is in agreement with plan. We will subsequently see were things that at follow-up. If anything changes worsens meantime he will contact the office and let me know. Otherwise will see him back for a nurse visit on Monday  and I'll see him for a physician visit on Thursday. Please see above for specific wound care orders. We will see patient for re-evaluation in 1 week(s) here in the clinic. If anything worsens or changes patient will contact our office for additional recommendations. with regard to the hospital bed I do believe this could be beneficial for him in both helping with his breathing living lies down so that he is able to more successfully elevate his legs. We're gonna get in touch with his primary care office to hopefully see if they could consider ordering this for him. I will subsequently also see about sending a copy of the note from today if that would be deemed at all helpful for the primary care provider. The patient is in agreement with this plan. If anything changes worsens in the meantime he will let me know. Electronic Signature(s) Signed: 10/31/2018 4:36:20 PM By: Worthy Keeler PA-C Entered By: Worthy Keeler on 10/31/2018 14:08:29 Adam Merritt ([id number]) -------------------------------------------------------------------------------- ROS/PFSH Details Patient Name: Adam Merritt Date of Service: 10/31/2018 1:45 PM Medical Record Number: [id number] Patient Account Number: [id number] Date of Birth/Sex: 07-21-1945 (73 y.o. M) Treating RN: Cornell Barman Primary Care Provider: [name] Other Clinician: Referring Provider: [name] Treating Provider/Extender: Melburn Hake, Michael Ventresca [name]: 58 Information Obtained From Patient Constitutional Symptoms (General Health) Complaints and Symptoms: Negative for: Fatigue; Fever; Chills; Marked Weight Change Respiratory Complaints and Symptoms: Negative for: Chronic or frequent coughs; Shortness of Breath Medical History: Positive for: Chronic Obstructive Pulmonary Disease (COPD); Sleep Apnea Negative for: Aspiration; Asthma; Pneumothorax; Tuberculosis Past Medical History Notes: home O2 at night as  needed Cardiovascular Complaints and Symptoms: Positive for: LE edema Negative for: Chest pain Medical History: Positive for: Congestive Heart Failure; Coronary Artery Disease; Hypertension; Peripheral Venous Disease Negative for: Angina; Arrhythmia; Deep Vein Thrombosis; Hypotension; Myocardial Infarction; Peripheral Arterial Disease; Phlebitis; Vasculitis Past Medical History Notes: varicose veins Psychiatric  Complaints and Symptoms: Negative for: Anxiety; Claustrophobia Medical History: Negative for: Anorexia/bulimia; Confinement Anxiety Eyes Medical History: Negative for: Cataracts; Glaucoma; Optic Neuritis Ear/Nose/Mouth/Throat Medical History: Negative for: Chronic sinus problems/congestion; Middle ear problems Adam Merritt, Adam Merritt ([id number]) Hematologic/Lymphatic Medical History: Positive for: Anemia - aplastic anemia; Lymphedema Negative for: Hemophilia; Human Immunodeficiency Virus; Sickle Cell Disease Gastrointestinal Medical History: Negative for: Cirrhosis ; Colitis; Crohnos; Hepatitis A; Hepatitis B; Hepatitis C Endocrine Medical History: Positive for: Type II Diabetes Negative for: Type I Diabetes Time with diabetes: since 2006 Treated with: Insulin Blood sugar tested every day: Yes Tested : Blood sugar testing results: Breakfast: 209 Genitourinary Medical History: Negative for: End Stage Renal Disease Immunological Medical History: Negative for: Lupus Erythematosus; Raynaudos; Scleroderma Integumentary (Skin) Medical History: Negative for: History of Burn; History of pressure wounds Musculoskeletal Medical History: Positive for: Osteoarthritis Negative for: Gout; Rheumatoid Arthritis; Osteomyelitis Neurologic Medical History: Positive for: Neuropathy Negative for: Dementia; Quadriplegia; Paraplegia; Seizure Disorder Past Medical History Notes: CVA - 1992 Oncologic Medical History: Positive for: Received Chemotherapy - last dose 2006 Negative  for: Received Radiation Immunizations Pneumococcal Vaccine: Adam Merritt, Adam Merritt ([id number]) Received Pneumococcal Vaccination: Yes Implantable Devices No devices added Hospitalization / Surgery History Type of Hospitalization/Surgery bronchitis Family and Social History Cancer: Yes - Paternal Grandparents; Diabetes: No; Heart Disease: No; Hereditary Spherocytosis: No; Hypertension: No; Kidney Disease: Yes - Siblings; Lung Disease: Yes - Mother; Seizures: No; Stroke: No; Thyroid Problems: No; Tuberculosis: No; Never smoker; Marital Status - Married; Alcohol Use: Never; Drug Use: No History; Caffeine Use: Never; Financial Concerns: No; Food, Clothing or Shelter Needs: No; Support System Lacking: No; Transportation Concerns: No Physician Affirmation I have reviewed and agree with the above information. Electronic Signature(s) Signed: 10/31/2018 4:36:20 PM By: Worthy Keeler PA-C Signed: 11/05/2018 5:54:26 PM By: Gretta Cool, BSN, RN, CWS, Kim RN, BSN Entered By: Worthy Keeler on 10/31/2018 14:06:58 Adam Merritt ([id number]) -------------------------------------------------------------------------------- SuperBill Details Patient Name: Adam Merritt Date of Service: 10/31/2018 Medical Record Number: [id number] Patient Account Number: [id number] Date of Birth/Sex: 04-18-1946 (73 y.o. M) Treating RN: Cornell Barman Primary Care Provider: [name] Other Clinician: Referring Provider: [name] Treating Provider/Extender: Melburn Hake, Makeya Hilgert [name]: 29 Diagnosis Coding ICD-10 Codes Code Description I89.0 Lymphedema, not elsewhere classified L97.512 Non-pressure chronic ulcer of other part of right foot with fat layer exposed L97.822 Non-pressure chronic ulcer of other part of left lower leg with fat layer exposed L98.492 Non-pressure chronic ulcer of skin of other sites with fat layer exposed E66.9 Obesity, unspecified Facility Procedures CPT4: Description Modifier Quantity Code  [id number] BILATERAL: Application of multi-layer venous compression system; leg (below 1 knee), including ankle and foot. Physician Procedures CPT4 Code Description: 6803212 [id number] - WC PHYS LEVEL 4 - EST PT ICD-10 Diagnosis Description I89.0 Lymphedema, not elsewhere classified L97.512 Non-pressure chronic ulcer of other part of right foot with fat L97.822 Non-pressure chronic ulcer of other  part of left lower leg with L98.492 Non-pressure chronic ulcer of skin of other sites with fat layer Modifier: layer exposed fat layer expos exposed Quantity: 1 ed Electronic Signature(s) Signed: 10/31/2018 4:36:20 PM By: Worthy Keeler PA-C Entered By: Worthy Keeler on 10/31/2018 14:08:41

## 2018-11-06 NOTE — Progress Notes (Signed)
Adam, Merritt ([id number]) Visit Report for 10/31/2018 Arrival Information Details Patient Name: Adam Merritt, Adam Merritt Date of Service: 10/31/2018 1:45 PM Medical Record Number: [id number] Patient Account Number: [id number] Date of Birth/Sex: 1946/06/04 (73 y.o. M) Treating RN: Montey Hora Primary Care Lily Velasquez: [name] Other Clinician: Referring Mily Malecki: [name] Treating Ailynn Gow/Extender: Melburn Hake, [name]: 72 Visit Information History Since Last Visit Added or deleted any medications: No Patient Arrived: Cane Any new allergies or adverse reactions: No Arrival Time: 13:27 Had a fall or experienced change in No Accompanied By: self activities of daily living that may affect Transfer Assistance: None risk of falls: Patient Identification Verified: Yes Signs or symptoms of abuse/neglect since last visito No Secondary Verification Process Yes Hospitalized since last visit: No Completed: Implantable device outside of the clinic excluding No Patient Has Alerts: Yes cellular tissue based products placed in the center Patient Alerts: Patient on Blood since last visit: Thinner Has Dressing in Place as Prescribed: Yes Aspirin 325mg  Has Compression in Place as Prescribed: Yes Type II Diabetes ABI 5/19 L 1.1 R 0.9 Pain Present Now: No TBI L 0.6 R 0.6 Electronic Signature(s) Signed: 10/31/2018 3:16:01 PM By: Montey Hora Entered By: Montey Hora on 10/31/2018 13:27:43 Adam Merritt ([id number]) -------------------------------------------------------------------------------- Compression Therapy Details Patient Name: Adam Merritt Date of Service: 10/31/2018 1:45 PM Medical Record Number: [id number] Patient Account Number: [id number] Date of Birth/Sex: [birth year] (73 y.o. M) Treating RN: Cornell Barman Primary Care Daphne Karrer: [name] Other Clinician: Referring Reilly Molchan: [name] Treating Janai Brannigan/Extender: Melburn Hake, [name]:  29 Compression Therapy Performed for Wound Assessment: Wound #21 Right,Anterior Lower Leg Performed By: Clinician Cornell Barman, RN Compression Type: Three Layer Post Procedure Diagnosis Same as Pre-procedure Electronic Signature(s) Signed: 11/05/2018 5:54:26 PM By: Gretta Cool, BSN, RN, CWS, Kim RN, BSN Entered By: Gretta Cool, BSN, RN, CWS, Kim on 10/31/2018 14:01:03 Adam Merritt ([id number]) -------------------------------------------------------------------------------- Compression Therapy Details Patient Name: Adam Merritt Date of Service: 10/31/2018 1:45 PM Medical Record Number: [id number] Patient Account Number: [id number] Date of Birth/Sex: [birth year] (73 y.o. M) Treating RN: Cornell Barman Primary Care Mikell Kazlauskas: [name] Other Clinician: Referring Thorn Demas: [name] Treating Siegfried Vieth/Extender: Melburn Hake, [name]: 29 Compression Therapy Performed for Wound Assessment: Wound #20 Right,Posterior Lower Leg Performed By: Clinician Cornell Barman, RN Compression Type: Three Layer Post Procedure Diagnosis Same as Pre-procedure Electronic Signature(s) Signed: 11/05/2018 5:54:26 PM By: Gretta Cool, BSN, RN, CWS, Kim RN, BSN Entered By: Gretta Cool, BSN, RN, CWS, Kim on 10/31/2018 14:01:03 Adam Merritt ([id number]) -------------------------------------------------------------------------------- Compression Therapy Details Patient Name: Adam Merritt Date of Service: 10/31/2018 1:45 PM Medical Record Number: [id number] Patient Account Number: [id number] Date of Birth/Sex: 1946-03-31 (73 y.o. M) Treating RN: Cornell Barman Primary Care Elverta Dimiceli: [name] Other Clinician: Referring Shawnya Mayor: [name] Treating Breken Nazari/Extender: Melburn Hake, [name]: 29 Compression Therapy Performed for Wound Assessment: Wound #22 Left,Circumferential Lower Leg Performed By: Clinician Cornell Barman, RN Compression Type: Three Layer Post Procedure Diagnosis Same as Pre-procedure Electronic  Signature(s) Signed: 11/05/2018 5:54:26 PM By: Gretta Cool, BSN, RN, CWS, Kim RN, BSN Entered By: Gretta Cool, BSN, RN, CWS, Kim on 10/31/2018 14:01:03 Adam Merritt ([id number]) -------------------------------------------------------------------------------- Lower Extremity Assessment Details Patient Name: Adam Merritt Date of Service: 10/31/2018 1:45 PM Medical Record Number: [id number] Patient Account Number: [id number] Date of Birth/Sex: Aug 31, 1945 (73 y.o. M) Treating RN: Montey Hora Primary Care Makaylen Thieme: [name] Other Clinician: Referring Jalaine Riggenbach: [name] Treating Williemae Muriel/Extender: STONE III, [name]: 64  Edema Assessment Assessed: [Left: No] [Right: No] Edema: [Left: Yes] [Right: Yes] Calf Left: Right: Point of Measurement: 37 cm From Medial Instep 64.5 cm 56.3 cm Ankle Left: Right: Point of Measurement: 12 cm From Medial Instep 35.3 cm 30.5 cm Electronic Signature(s) Signed: 10/31/2018 3:16:01 PM By: Montey Hora Entered By: Montey Hora on 10/31/2018 13:42:28 Adam Merritt ([id number]) -------------------------------------------------------------------------------- Multi Wound Chart Details Patient Name: Adam Merritt Date of Service: 10/31/2018 1:45 PM Medical Record Number: [id number] Patient Account Number: [id number] Date of Birth/Sex: 11/19/1945 (73 y.o. M) Treating RN: Cornell Barman Primary Care Turner Kunzman: [name] Other Clinician: Referring Mariesa Grieder: [name] Treating Kyngston Pickelsimer/Extender: Melburn Hake, [name]: 29 Vital Signs Height(in): 65 Pulse(bpm): 68 Weight(lbs): 323 Blood Pressure(mmHg): 155/77 Body Mass Index(BMI): 52 Temperature(F): 98.3 Respiratory Rate 18 (breaths/min): Photos: Wound Location: Right Lower Leg - Posterior Right Lower Leg - Anterior Left Lower Leg - Circumferential Wounding Event: Gradually Appeared Gradually Appeared Gradually Appeared Primary Etiology: Diabetic Wound/Ulcer of the  Lymphedema Lymphedema Lower Extremity Comorbid History: Anemia, Lymphedema, Anemia, Lymphedema, Anemia, Lymphedema, Chronic Obstructive Chronic Obstructive Chronic Obstructive Pulmonary Disease (COPD), Pulmonary Disease (COPD), Pulmonary Disease (COPD), Sleep Apnea, Congestive Sleep Apnea, Congestive Sleep Apnea, Congestive Heart Failure, Coronary Artery Heart Failure, Coronary Artery Heart Failure, Coronary Artery Disease, Hypertension, Disease, Hypertension, Disease, Hypertension, Peripheral Venous Disease, Peripheral Venous Disease, Peripheral Venous Disease, Type II Diabetes, Type II Diabetes, Type II Diabetes, Osteoarthritis, Neuropathy, Osteoarthritis, Neuropathy, Osteoarthritis, Neuropathy, Received Chemotherapy Received Chemotherapy Received Chemotherapy Date Acquired: 10/02/2018 10/25/2018 10/25/2018 Weeks of Treatment: 3 0 0 Wound Status: Open Open Open Measurements L x W x D 1.5x1.8x0.1 2.3x3x0.1 4.5x41x0.1 (cm) Area (cm) : 2.121 5.419 144.906 AUTHUR, CUBIT. ([id number]) Volume (cm) : 0.212 0.542 14.491 % Reduction in Area: 86.50% N/A N/A % Reduction in Volume: 86.50% N/A N/A Classification: Grade 2 Partial Thickness Partial Thickness Exudate Amount: Medium Large Large Exudate Type: Serous Serous Serous Exudate Color: amber amber amber Wound Margin: Flat and Intact Flat and Intact Flat and Intact Granulation Amount: Large (67-100%) Large (67-100%) Large (67-100%) Granulation Quality: Pink Red Pink Necrotic Amount: None Present (0%) None Present (0%) None Present (0%) Exposed Structures: Fat Layer (Subcutaneous Fascia: No Fascia: No Tissue) Exposed: Yes Fat Layer (Subcutaneous Fat Layer (Subcutaneous Fascia: No Tissue) Exposed: No Tissue) Exposed: No Tendon: No Tendon: No Tendon: No Muscle: No Muscle: No Muscle: No Joint: No Joint: No Joint: No Bone: No Bone: No Bone: No Limited to Skin Breakdown Limited to Skin Breakdown Epithelialization: Large (67-100%)  Medium (34-66%) Medium (34-66%) Treatment Notes Electronic Signature(s) Signed: 11/05/2018 5:54:26 PM By: Gretta Cool, BSN, RN, CWS, Kim RN, BSN Entered By: Gretta Cool, BSN, RN, CWS, Kim on 10/31/2018 14:00:24 Adam Merritt ([id number]) -------------------------------------------------------------------------------- [place] Details Patient Name: Adam Merritt Date of Service: 10/31/2018 1:45 PM Medical Record Number: [id number] Patient Account Number: [id number] Date of Birth/Sex: 10-19-1945 (73 y.o. M) Treating RN: Cornell Barman Primary Care Chasiti Waddington: [name] Other Clinician: Referring Mazella Adam: [name] Treating Miqueas Whilden/Extender: Melburn Hake, [name]: 72 Active Inactive Abuse / Safety / Falls / Self Care Management Nursing Diagnoses: Potential for falls Goals: Patient will remain injury free related to falls Date Initiated: 04/09/2018 Target Resolution Date: 10/05/2018 Goal Status: Active Interventions: Assess fall risk on admission and as needed Notes: Venous Leg Ulcer Nursing Diagnoses: Actual venous Insuffiency (use after diagnosis is confirmed) Goals: Patient will maintain optimal edema control Date Initiated: 04/09/2018 Target Resolution Date: 10/05/2018 Goal Status: Active Interventions: Assess peripheral edema status every  visit. Compression as ordered Provide education on venous insufficiency Notes: Electronic Signature(s) Signed: 11/05/2018 5:54:26 PM By: Gretta Cool, BSN, RN, CWS, Kim RN, BSN Entered By: Gretta Cool, BSN, RN, CWS, Kim on 10/31/2018 14:00:15 Adam Merritt ([id number]) -------------------------------------------------------------------------------- Pain Assessment Details Patient Name: Adam Merritt Date of Service: 10/31/2018 1:45 PM Medical Record Number: [id number] Patient Account Number: [id number] Date of Birth/Sex: [birth year] (73 y.o. M) Treating RN: Montey Hora Primary Care Pasha Gadison: [name] Other  Clinician: Referring Travor Royce: [name] Treating Oneal Biglow/Extender: Melburn Hake, [name]: 58 Active Problems Location of Pain Severity and Description of Pain Patient Has Paino No Site Locations Pain Management and Medication Current Pain Management: Electronic Signature(s) Signed: 10/31/2018 3:16:01 PM By: Montey Hora Entered By: Montey Hora on 10/31/2018 13:27:56 Adam Merritt ([id number]) -------------------------------------------------------------------------------- Patient/Caregiver Education Details Patient Name: Adam Merritt Date of Service: 10/31/2018 1:45 PM Medical Record Number: [id number] Patient Account Number: [id number] Date of Birth/Gender: December 09, 1945 (73 y.o. M) Treating RN: Cornell Barman Primary Care Physician: [name] Other Clinician: Referring Physician: [name] Treating Physician/Extender: Sharalyn Ink in Treatment: 61 Education Assessment Education Provided To: Patient Education Topics Provided Venous: Handouts: Controlling Swelling with Compression Stockings Methods: Demonstration, Explain/Verbal Responses: State content correctly Wound/Skin Impairment: Handouts: Caring for Your Ulcer Methods: Demonstration, Explain/Verbal Responses: State content correctly Electronic Signature(s) Signed: 11/05/2018 5:54:26 PM By: Gretta Cool, BSN, RN, CWS, Kim RN, BSN Entered By: Gretta Cool, BSN, RN, CWS, Kim on 10/31/2018 14:05:08 Adam Merritt ([id number]) -------------------------------------------------------------------------------- Wound Assessment Details Patient Name: Adam Merritt Date of Service: 10/31/2018 1:45 PM Medical Record Number: [id number] Patient Account Number: [id number] Date of Birth/Sex: Feb 25, 1946 (73 y.o. M) Treating RN: Montey Hora Primary Care Kemuel Buchmann: [name] Other Clinician: Referring Aleck Locklin: [name] Treating Declyn Offield/Extender: Melburn Hake, [name]: 29 Wound Status Wound  Number: 20 Primary Diabetic Wound/Ulcer of the Lower Extremity Etiology: Wound Location: Right Lower Leg - Posterior Wound Open Wounding Event: Gradually Appeared Status: Date Acquired: 10/02/2018 Comorbid Anemia, Lymphedema, Chronic Obstructive Weeks Of Treatment: 3 History: Pulmonary Disease (COPD), Sleep Apnea, Clustered Wound: No Congestive Heart Failure, Coronary Artery Disease, Hypertension, Peripheral Venous Disease, Type II Diabetes, Osteoarthritis, Neuropathy, Received Chemotherapy Photos Wound Measurements Length: (cm) 1.5 % Reduction in Width: (cm) 1.8 % Reduction in Depth: (cm) 0.1 Epithelializat Area: (cm) 2.121 Tunneling: Volume: (cm) 0.212 Undermining: Area: 86.5% Volume: 86.5% ion: Large (67-100%) No No Wound Description Classification: Grade 2 Foul Odor Aft Wound Margin: Flat and Intact Slough/Fibrin Exudate Amount: Medium Exudate Type: Serous Exudate Color: amber er Cleansing: No o Yes Wound Bed Granulation Amount: Large (67-100%) Exposed Structure Granulation Quality: Pink Fascia Exposed: No Necrotic Amount: None Present (0%) Fat Layer (Subcutaneous Tissue) Exposed: Yes Tendon Exposed: No Muscle Exposed: No Joint Exposed: No Bone Exposed: No DESHANE, COTRONEO ([id number]) Electronic Signature(s) Signed: 10/31/2018 3:16:01 PM By: Montey Hora Entered By: Montey Hora on 10/31/2018 13:36:11 Adam Merritt ([id number]) -------------------------------------------------------------------------------- Wound Assessment Details Patient Name: Adam Merritt Date of Service: 10/31/2018 1:45 PM Medical Record Number: [id number] Patient Account Number: [id number] Date of Birth/Sex: 11/27/1945 (73 y.o. M) Treating RN: Montey Hora Primary Care Kiaria Quinnell: [name] Other Clinician: Referring Mani Celestin: [name] Treating Hurley Blevins/Extender: STONE III, [name]: 29 Wound Status Wound Number: 21 Primary Lymphedema Etiology: Wound  Location: Right Lower Leg - Anterior Wound Open Wounding Event: Gradually Appeared Status: Date Acquired: 10/25/2018 Comorbid Anemia, Lymphedema, Chronic Obstructive Weeks Of Treatment: 0 History: Pulmonary Disease (COPD), Sleep Apnea, Clustered Wound: No Congestive Heart  Failure, Coronary Artery Disease, Hypertension, Peripheral Venous Disease, Type II Diabetes, Osteoarthritis, Neuropathy, Received Chemotherapy Photos Wound Measurements Length: (cm) 2.3 % Reduction in Width: (cm) 3 % Reduction in Depth: (cm) 0.1 Epithelializat Area: (cm) 5.419 Tunneling: Volume: (cm) 0.542 Undermining: Area: Volume: ion: Medium (34-66%) No No Wound Description Classification: Partial Thickness Foul Odor Aft Wound Margin: Flat and Intact Slough/Fibrin Exudate Amount: Large Exudate Type: Serous Exudate Color: amber er Cleansing: No o No Wound Bed Granulation Amount: Large (67-100%) Exposed Structure Granulation Quality: Red Fascia Exposed: No Necrotic Amount: None Present (0%) Fat Layer (Subcutaneous Tissue) Exposed: No Tendon Exposed: No Muscle Exposed: No Joint Exposed: No Bone Exposed: No CESARIO, WEIDINGER ([id number]) Limited to Skin Breakdown Electronic Signature(s) Signed: 10/31/2018 3:16:01 PM By: Montey Hora Entered By: Montey Hora on 10/31/2018 13:38:17 Adam Merritt ([id number]) -------------------------------------------------------------------------------- Wound Assessment Details Patient Name: Adam Merritt Date of Service: 10/31/2018 1:45 PM Medical Record Number: [id number] Patient Account Number: [id number] Date of Birth/Sex: [birth year] (73 y.o. M) Treating RN: Montey Hora Primary Care Rodrigues Urbanek: [name] Other Clinician: Referring Mamadou Breon: [name] Treating Chauntay Paszkiewicz/Extender: Melburn Hake, [name]: 29 Wound Status Wound Number: 22 Primary Lymphedema Etiology: Wound Location: Left Lower Leg - Circumferential Wound  Open Wounding Event: Gradually Appeared Status: Date Acquired: 10/25/2018 Comorbid Anemia, Lymphedema, Chronic Obstructive Weeks Of Treatment: 0 History: Pulmonary Disease (COPD), Sleep Apnea, Clustered Wound: No Congestive Heart Failure, Coronary Artery Disease, Hypertension, Peripheral Venous Disease, Type II Diabetes, Osteoarthritis, Neuropathy, Received Chemotherapy Photos Wound Measurements Length: (cm) 4.5 Width: (cm) 41 Depth: (cm) 0.1 Area: (cm) 144.906 Volume: (cm) 14.491 % Reduction in Area: % Reduction in Volume: Epithelialization: Medium (34-66%) Tunneling: No Undermining: No Wound Description Classification: Partial Thickness Foul Odo Wound Margin: Flat and Intact Slough/F Exudate Amount: Large Exudate Type: Serous Exudate Color: amber r After Cleansing: No ibrino No Wound Bed Granulation Amount: Large (67-100%) Exposed Structure Granulation Quality: Pink Fascia Exposed: No Necrotic Amount: None Present (0%) Fat Layer (Subcutaneous Tissue) Exposed: No Tendon Exposed: No Muscle Exposed: No Joint Exposed: No Bone Exposed: No KEANDRE, LINDEN ([id number]) Limited to Skin Breakdown Electronic Signature(s) Signed: 10/31/2018 3:16:01 PM By: Montey Hora Entered By: Montey Hora on 10/31/2018 13:40:01 Adam Merritt ([id number]) -------------------------------------------------------------------------------- Vitals Details Patient Name: Adam Merritt Date of Service: 10/31/2018 1:45 PM Medical Record Number: [id number] Patient Account Number: [id number] Date of Birth/Sex: 02-25-1946 (73 y.o. M) Treating RN: Montey Hora Primary Care Suzane Vanderweide: [name] Other Clinician: Referring Flavia Bruss: [name] Treating Janiyla Long/Extender: Melburn Hake, [name]: 29 Vital Signs Time Taken: 13:28 Temperature (F): 98.3 Height (in): 66 Pulse (bpm): 68 Weight (lbs): 323 Respiratory Rate (breaths/min): 18 Body Mass Index (BMI): 52.1 Blood  Pressure (mmHg): 155/77 Reference Range: 80 - 120 mg / dl Electronic Signature(s) Signed: 10/31/2018 3:16:01 PM By: Montey Hora Entered By: Montey Hora on 10/31/2018 13:28:46

## 2018-11-07 ENCOUNTER — Other Ambulatory Visit: Payer: Self-pay

## 2018-11-07 ENCOUNTER — Encounter: Payer: Medicare Other | Attending: Physician Assistant | Admitting: Physician Assistant

## 2018-11-07 DIAGNOSIS — E11621 Type 2 diabetes mellitus with foot ulcer: Secondary | ICD-10-CM | POA: Diagnosis not present

## 2018-11-07 DIAGNOSIS — J449 Chronic obstructive pulmonary disease, unspecified: Secondary | ICD-10-CM | POA: Diagnosis not present

## 2018-11-07 DIAGNOSIS — M1990 Unspecified osteoarthritis, unspecified site: Secondary | ICD-10-CM | POA: Insufficient documentation

## 2018-11-07 DIAGNOSIS — I509 Heart failure, unspecified: Secondary | ICD-10-CM | POA: Diagnosis not present

## 2018-11-07 DIAGNOSIS — I2510 Atherosclerotic heart disease of native coronary artery without angina pectoris: Secondary | ICD-10-CM | POA: Diagnosis not present

## 2018-11-07 DIAGNOSIS — L97512 Non-pressure chronic ulcer of other part of right foot with fat layer exposed: Secondary | ICD-10-CM | POA: Diagnosis not present

## 2018-11-07 DIAGNOSIS — L98492 Non-pressure chronic ulcer of skin of other sites with fat layer exposed: Secondary | ICD-10-CM | POA: Diagnosis not present

## 2018-11-07 DIAGNOSIS — L97829 Non-pressure chronic ulcer of other part of left lower leg with unspecified severity: Secondary | ICD-10-CM | POA: Diagnosis not present

## 2018-11-07 DIAGNOSIS — I110 Hypertensive heart disease with heart failure: Secondary | ICD-10-CM | POA: Diagnosis not present

## 2018-11-07 DIAGNOSIS — D649 Anemia, unspecified: Secondary | ICD-10-CM | POA: Diagnosis not present

## 2018-11-07 DIAGNOSIS — I890 Lymphedema, not elsewhere classified: Secondary | ICD-10-CM | POA: Diagnosis not present

## 2018-11-07 DIAGNOSIS — E1140 Type 2 diabetes mellitus with diabetic neuropathy, unspecified: Secondary | ICD-10-CM | POA: Diagnosis present

## 2018-11-07 DIAGNOSIS — G4730 Sleep apnea, unspecified: Secondary | ICD-10-CM | POA: Diagnosis not present

## 2018-11-08 ENCOUNTER — Encounter: Payer: Self-pay | Admitting: Nurse Practitioner

## 2018-11-08 ENCOUNTER — Ambulatory Visit (INDEPENDENT_AMBULATORY_CARE_PROVIDER_SITE_OTHER): Payer: Medicare Other | Admitting: Nurse Practitioner

## 2018-11-08 VITALS — BP 122/63 | HR 70 | Resp 16 | Ht 66.0 in | Wt 300.4 lb

## 2018-11-08 DIAGNOSIS — E1165 Type 2 diabetes mellitus with hyperglycemia: Secondary | ICD-10-CM | POA: Diagnosis not present

## 2018-11-08 DIAGNOSIS — R609 Edema, unspecified: Secondary | ICD-10-CM

## 2018-11-08 DIAGNOSIS — E1142 Type 2 diabetes mellitus with diabetic polyneuropathy: Secondary | ICD-10-CM

## 2018-11-08 DIAGNOSIS — I10 Essential (primary) hypertension: Secondary | ICD-10-CM | POA: Diagnosis not present

## 2018-11-08 DIAGNOSIS — I872 Venous insufficiency (chronic) (peripheral): Secondary | ICD-10-CM

## 2018-11-08 DIAGNOSIS — G894 Chronic pain syndrome: Secondary | ICD-10-CM

## 2018-11-08 DIAGNOSIS — R600 Localized edema: Secondary | ICD-10-CM

## 2018-11-08 MED ORDER — FUROSEMIDE 40 MG PO TABS: 40.0000 mg | ORAL_TABLET | Freq: Every day | ORAL | 3 refills | Status: DC

## 2018-11-08 MED ORDER — FENTANYL 100 MCG/HR TD PT72: 1.0000 | MEDICATED_PATCH | TRANSDERMAL | 0 refills | Status: DC

## 2018-11-08 MED ORDER — GABAPENTIN 300 MG PO CAPS: ORAL_CAPSULE | ORAL | 3 refills | Status: DC

## 2018-11-08 NOTE — Progress Notes (Signed)
[name], [name] ([id number]) Visit Report for 11/07/2018 Chief Complaint Document Details Patient Name: Adam Merritt Date of Service: 11/07/2018 12:30 PM Medical Record Number: [id number] Patient Account Number: [id number] Date of Birth/Sex: 02-12-1946 (73 y.o. M) Treating RN: Cornell Barman Primary Care Provider: [name] Other Clinician: Referring Provider: [name] Treating Provider/Extender: Melburn Hake, [name]: 30 Information Obtained from: Patient Chief Complaint he is here for evaluation of bilateral lower extremity lymphedema right right toe and left LE ulcers Electronic Signature(s) Signed: 11/08/2018 4:38:37 PM By: Worthy Keeler PA-C Entered By: Worthy Keeler on 11/07/2018 12:53:35 Adam Merritt ([id number]) -------------------------------------------------------------------------------- HPI Details Patient Name: Adam Merritt Date of Service: 11/07/2018 12:30 PM Medical Record Number: [id number] Patient Account Number: [id number] Date of Birth/Sex: 1946/01/03 (73 y.o. M) Treating RN: Cornell Barman Primary Care Provider: [name] Other Clinician: Referring Provider: [name] Treating Provider/Extender: Melburn Hake, [name]: 30 History of Present Illness HPI Description: 10/18/17-He is here for initial evaluation of bilateral lower extremity ulcerations in the presence of venous insufficiency and lymphedema. He has been seen by vascular medicine in the past, Dr. Lucky Cowboy, last seen in 2016. He does have a history of abnormal ABIs, which is to be expected given his lymphedema and venous insufficiency. According to Epic, it appears that all attempts for arterial evaluation and/or angiography were not follow through with by patient. He does have a history of being seen in lymphedema clinic in 2018, stopped going approximately 6 months ago stating "it didn't do any good". He does not have lymphedema pumps, he does not have custom fit compression  wrap/stockings. He is diabetic and his recent A1c last month was 7.6. He admits to chronic bilateral lower extremity pain, no change in pain since blister and ulceration development. He is currently being treated with Levaquin for bronchitis. He has home health and we will continue. 10/25/17-He is here in follow-up evaluation for bilateral lower extremity ulcerationssubtle he remains on Levaquin for bronchitis. Right lower extremity with no evidence of drainage or ulceration, persistent left lower extremity ulceration. He states that home health has not been out since his appointment. [facility], studies revealed: RIGHT ABI 0.9, TBI 0.6 LEFT ABI 1.1, TBI 0.6 with triphasic flow bilaterally. We will continue his same treatment plan. He has been educated on compression therapy and need for elevation. He will benefit from lymphedema pumps 11/01/17-He is here in follow-up evaluation for left lower extremity ulcer. The right lower extremity remains healed. He has home health services, but they have not been out to see the patient for 2-3 weeks. He states it home health physical therapy changed his dressing yesterday after therapy; he placed Ace wrap compression. We are still waiting for lymphedema pumps, reordered d/t need for company change. 11/08/17-He is here in follow-up evaluation for left lower extremity ulcer. It is improved. Edema is significantly improved with compression therapy. We will continue with same treatment plan and he will follow-up next week. No word regarding lymphedema pumps 11/15/17-He is here in follow-up evaluation for left lower extremity ulcer. He is healed and will be discharged from wound care services. I have reached out to [facility] regarding his lymphedema pumps. They have been unable to reach the patient; the contact number they had with the patient's wife's cell phone and she has not answered any unrecognized calls. Contact  should be made today, trial planned for next week; [facility] will continue to  follow 11/27/17 on evaluation today patient has multiple blistered areas over the right lower extremity his left lower extremity appears to be doing okay. These blistered areas show signs of no infection which is great news. With that being said he did have some necrotic skin overlying which was mechanically debrided away with saline and gauze today without complication. Overall post debridement the wounds appear to be doing better but in general his swelling seems to be increased. This is obviously not good news. I think this is what has given rise to the blisters. 12/04/17 on evaluation today patient presents for follow-up concerning his bilateral lower extremity edema in the right lower extremity ulcers. He has been tolerating the dressing changes without complication. With that being said he has had no real issues with the wraps which is also good news. Overall I'm pleased with the progress he's been making. 12/11/17 on evaluation today patient appears to be doing rather well in regard to his right lateral lower extremity ulcer. He's been tolerating the dressing changes without complication. Fortunately there does not appear to be any evidence of infection at this time. Overall I'm pleased with the progress that is being made. Unfortunately he has been in the hospital due to having what sounds to be a stomach virus/flu fortunately that is starting to get better. 12/18/17 on evaluation today patient actually appears to be doing very well in regard to his bilateral lower extremities the swelling is under fairly good control his lymphedema pumps are still not up and running quite yet. With that being said he does have several areas of opening noted as far as wounds are concerned mainly over the left lower extremity. With that being said I do believe once he gets lymphedema pumps this would least help you mention some the  fluid and preventing this from occurring. Hopefully that will be set up soon sleeves are Artie in place at his home he just waiting for the machine. 12/25/17 on evaluation today patient actually appears to be doing excellent in fact all of his ulcers appear to have resolved his JANET, Merritt. ([id number]) legs appear very well. I do think he needs compression stockings we have discussed this and they are actually going to go to [place] today to elastic therapy to get this fitted for him. I think that is definitely a good thing to do. Readmission: 04/09/18 upon evaluation today this patient is seen for readmission due to bilateral lower extremity lymphedema. He has significant swelling of his extremities especially on the left although the right is also swollen he has weeping from both sides. There are no obvious open wounds at this point. Fortunately he has been doing fairly well for quite a bit of time since I last saw him. Nonetheless unfortunately this seems to have reopened and is giving quite a bit of trouble. He states this began about a week ago when he first called [place] to get in to be seen. No fevers, chills, nausea, or vomiting noted at this time. He has not been using his lymphedema pumps due to the fact that they won't fit on his leg at this point likewise is also not been using his compression for essentially the same reason. 04/16/18 upon evaluation today patient actually appears to be doing a little better in regard to the fluid in his bilateral lower extremities. With that being said he's had three falls since I saw him last week. He also states that he's been feeling very poorly. I was  concerned last week and feel like that the concern is still there as far as the congestion in his chest is concerned he seems to be breathing about the same as last week but again he states he's very weak he's not even able to walk further than from the chair to the door. His wife had to buy a  wheelchair just to be able to get them out of the house to get to the appointment today. This has me very concerned. 04/23/18 on evaluation today patient actually appears to be doing much better than last week's evaluation. At that time actually had to transport him to the ER via EMS and he subsequently was admitted for acute pulmonary edema, acute renal failure, and acute congestive heart failure. Fortunately he is doing much better. Apparently they did dialyze him and were able to take off roughly 35 pounds of fluid. Nonetheless he is feeling much better both in regard to his breathing and he's able to get around much better at this time compared to previous. Overall I'm very happy with how things are at this time. There does not appear to be any evidence of infection currently. No fevers, chills, nausea, or vomiting noted at this time. 04/30/2018 patient seen today for follow-up and management of bilateral lower extremity lymphedema. He did express being more sad today than usual due to the recent loss of his dog. He states that he has been compliant with using the lymphedema pumps. However he does admit a minute over the last 2-3 days he has not been using the pumps due to the recent loss of his dog. At this time there is no drainage or open wounds to his lower extremities. The left leg edema is measuring smaller today. Still has a significant amount of edema on bilateral lower extremities With dry flaky skin. He will be referred to the lymphedema clinic for further management. Will continue 3 layer compression wraps and follow-up in 1-2 weeks.Denies any pain, fever, chairs recently. No recent falls or injuries reported during this visit. 05/07/18 on evaluation today patient actually appears to be doing very well in regard to his lower extremities in general all things considered. With that being said he is having some pain in the legs just due to the amount of swelling. He does have an area  where he had a blister on the left lateral lower extremity this is open at this point other than that there's nothing else weeping at this time. 05/14/18 on evaluation today patient actually appears to be doing excellent all things considered in regard to his lower extremities. He still has a couple areas of weeping on each leg which has continued to be the issue for him. He does have an appointment with the lymphedema clinic although this isn't until February 2020. That was the earliest they had. In the meantime he has continued to tolerate the compression wraps without complication. 05/28/18 on evaluation today patient actually appears to be doing more poorly in regard to his left lower extremity where he has a wound open at this point. He also had a fall where he subsequently injured his right great toe which has led to an open wound at the site unfortunately. He has been tolerating the dressing changes without complication in general as far as the wraps are concerned that he has not been putting any dressing on the left 1st toe ulcer site. 06/11/18 on evaluation today patient appears to be doing much worse in regard to his  bilateral lower extremity ulcers. He has been tolerating the dressing changes without complication although his legs have not been wrapped more recently. Overall I am not very pleased with the way his legs appear. I do believe he needs to be back in compression wraps he still has not received his compression wraps from the [facility] hospital as of yet. 06/18/18 on evaluation today patient actually appears to be doing significantly better than last time I saw him. He has been tolerating the compression wraps without complication in the circumferential ulcers especially appear to be doing much better. His toe ulcer on the right in regard to the great toe is better although not as good as the legs in my pinion. No fevers chills noted Adam Merritt, Adam Merritt ([id number]) 07/02/18 on evaluation  today patient appears to be doing much better in regard to his lower extremity ulcers. Unfortunately since I last saw him he's had the distal portion of his right great toe if he dated it sounds as if this actually went downhill very quickly. I had only seen him a few days prior and the toe did not appear to be infected at that point subsequently became infected very rapidly and it was decided by the surgeon that the distal portion of the toe needed to be removed. The patient seems to be doing well in this regard he tells me. With that being said his lower extremities are doing better from the standpoint of the wounds although he is significantly swollen at this point. 07/09/18 on evaluation today patient appears to be doing better in regard to the wounds on his lower extremities. In fact everything is almost completely healed he is just a small area on the left posterior lower extremity that is open at this point. 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 With that being said he needs some kind of compression it does sound like he may not be wearing his compression, that is the wraps, during the entire time between when he's here visit to visit. Apparently his wife took the current one off because it began to "fall apart". 07/16/18 on evaluation today patient appears to be extremely swollen especially in regard to his left lower extremity unfortunately. He also has a new skin tear over the left lower extremity and there's a smaller area on the right lower extremity as well. Unfortunately this seems to be due in part to blistering and fluid buildup in his leg. He did get the reduction wraps that were ordered by the [facility] hospital for him to go to lymphedema clinic. With that being said his wounds on the legs have not healed  to the point to where they would likely accept them as a patient lymphedema clinic currently. We need to try to get this to heal. With that being said he's been taking his wraps off which is not doing him any favors at this point. In fact this is probably quite counterproductive compared to what needs to occur. We will likely need to increase to a four layer compression wrap and continue to also utilize elevation and he has to keep the wraps on not take them off as he's been doing currently hasn't had a wrap on since Saturday. 07/23/18 on evaluation today patient appears to be doing much better in regard to his bilateral lower extremities.  In fact his left lower extremity which was the largest is actually 15 cm smaller today compared to what it was last time he was here in our clinic. This is obviously good news after just one week. Nonetheless the differences he actually kept the wraps on during the entire week this time. That's not typical for him. I do believe he understands a little bit better now the severity of the situation and why it's important for him to keep these wraps on. 07/30/18 on evaluation today patient actually appears to be doing rather well in regard to his lower extremities. His legs are much smaller than they have been in the past and he actually has only one very small rudder superficial region remaining that is not closed on the left lateral/posterior lower extremity even this is almost completely close. I do believe likely next week he will be healed without any complications. I do think we need to continue the wraps however this seems to be beneficial for him. I also think it may be a good time for [place] to go ahead and see about getting the appointment with the lymphedema clinic which is supposed to be made for him in order to keep this moving along and hopefully get them into compression wraps that will in the end help him to remain healed. 08/06/18 on evaluation today patient  actually appears to be doing very well in regard as bilateral lower extremities. In fact his wounds appear to be completely healed at this time. 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 He is in the process of getting appointment with the lymphedema clinic we have made this referral were just waiting to hear back on the schedule time. We need to follow up on that today as well. 08/13/18 on evaluation today patient actually appears to be doing very well in regard to his bilateral lower extremities there are no open wounds at this point. We are gonna go ahead and see about ordering the Velcro compression wraps for him had a discussion with them about [place] doing it versus the [place] they feel like they can definitely afford going ahead and get the wraps themselves and they would prefer to try to avoid having to go to the lymphedema clinic if it all possible which I completely understand. As long as he has good compression I'm okay either way. 3/17//20 on evaluation today patient actually appears to be doing well in regard to his bilateral lower extremity ulcers. He has been tolerating the dressing changes without complication specifically the compression wraps. Overall is had no issues my fingers finding that I see at this point is that he is having trouble with constipation. He tells me he has not been able to go to the bathroom for about six days. He's taken to over-the-counter oral laxatives unfortunately this is not helping. He has not contacted his doctor. 08/27/18 on evaluation today patient appears to be doing fairly well in regard to his lower extremities at this point. There does not appear to be any new altars is swelling is very well controlled. We are still waiting for his Velcro compression wraps to arrive that should be sometime in the next week is Artie sent the check for these. 09/03/18 on evaluation today patient appears to be doing  excellent in regard to his bilateral lower extremities which he shows Adam Merritt, Adam Merritt. ([id number]) no signs of wound openings in regard to this point. He does have  his Velcro compression wraps which did arrive in the mail since I saw him last week. Overall he is doing excellent in my pinion. 09/13/18 on evaluation today patient appears to be doing very well currently in regard to the overall appearance of his bilateral lower extremities although he's a little bit more swollen than last time we saw him. At that point he been discharged without any open wounds. Nonetheless he has a small open wound on the posterior left lower extremity with some evidence of cellulitis noted as well. Fortunately I feel like he has made good progress overall with regard to his lower extremities from were things used to be. 09/20/18 on evaluation today patient actually appears to be doing much better. The erythematous lower extremity is improving wound itself which is still open appears to be doing much better as far as but appearance as well as pain is concerned overall very pleased in this regard. There's no signs of active infection at this time. 09/27/18 on evaluation today patient's wounds on the lower extremity actually appear to be doing fairly well at this time which is good news. There is no evidence of active infection currently and again is just as left lower extremity were there any wounds at this point anyway. I believe they may be completely healed but again I'm not 100% sure based on evaluation today. I think one more week of observation would likely be a good idea. 10/04/18 on evaluation today patient actually appears to be doing excellent in regard to the left lower extremity which actually appears to be completely healed as of today. Unfortunately he's been having issues with his right lower extremity have a new wound that has opened. Fortunately there's no evidence of active infection at this time which is  good news. 10/10/18 upon evaluation today patient actually appears to be doing a little bit worse with the new open area on his right posterior lower extremity. He's been tolerating the dressing changes without complication. Right now we been using his compression wraps although I think we may need to switch back to actually performing bilateral compression wraps are in the clinic. No fevers, chills, nausea, or vomiting noted at this time. 10/17/18 on evaluation today patient actually appears to be doing quite well in regard to his bilateral lower extremity ulcers. He has been tolerating the reps without complication although he would prefer not to rewrap his legs as of today. Fortunately there's no signs of active infection which is good news. No fevers, chills, nausea, or vomiting noted at this time. 10/24/18 on evaluation today patient appears to be doing very well in regard to his lower extremities. His right lower extremity is shown signs of healing and his left lower [place] though not healed appears to be improving which is excellent news. Overall very pleased with how things seem to be progressing at this time. The patient is likewise happy to hear this. His Velcro compression wraps however have not been put on properly will gonna show his wife how to do that properly today. 10/31/18 on evaluation today patient appears to be doing more poorly in regard to his left lower extremity in particular although both lower extremities actually are showing some signs of being worse than my previous evaluation. Unfortunately I'm just not sure that his compression stockings even with the use of the compression/lymphedema pumps seem to be controlling this well. Upon further questioning he tells me that he also is not able to lie flat in the bed  due to his congestive heart failure and difficulty breathing. For that reason he sleeps in his recliner. To make matters worse his recliner also cannot even hold  his legs up so instead of even being somewhat elevated they pretty much hang down to the floor. This is the way he sleeps each night which is definitely counterproductive to everything else that were attempting to do from the standpoint of controlling his fluid. Nonetheless I think that he potentially could benefit from a hospital bed although this would be something that his primary care provider would likely have to order since anything that is order on our side has to be directly related to wound care and again the hospital bed is not necessarily a direct relation to although I think it does contribute to his overall wound status on his lower extremities. 11/07/18 on evaluation today patient appears to be doing better in regard to his bilateral lower extremity. He's been tolerating the dressing changes without complication. We did in the interim since I last saw him switch to just using extras orbiting new alginate and that seems to have done much better for him. I'm very pleased with the overall progress that is made. Electronic Signature(s) Signed: 11/08/2018 4:38:37 PM By: Worthy Keeler PA-C Entered By: Worthy Keeler on 11/07/2018 13:37:19 Adam Merritt ([id number]) -------------------------------------------------------------------------------- Physical Exam Details Patient Name: Adam Merritt Date of Service: 11/07/2018 12:30 PM Medical Record Number: [id number] Patient Account Number: [id number] Date of Birth/Sex: Oct 15, 1945 (73 y.o. M) Treating RN: Cornell Barman Primary Care Provider: [name] Other Clinician: Referring Provider: [name] Treating Provider/Extender: Melburn Hake, [name]: 29 Constitutional Well-nourished and well-hydrated in no acute distress. Respiratory normal breathing without difficulty. clear to auscultation bilaterally. Cardiovascular regular rate and rhythm with normal S1, S2. Psychiatric this patient is able to make decisions and  demonstrates good insight into disease process. Alert and Oriented x 3. pleasant and cooperative. Notes Upon inspection today patient's wounds and to show signs of fairly significant improvement at this point. There does not appear to be any signs of active infection at this time which is good news. Overall very pleased with how things have been appearing. Electronic Signature(s) Signed: 11/08/2018 4:38:37 PM By: Worthy Keeler PA-C Entered By: Worthy Keeler on 11/07/2018 13:39:57 Adam Merritt ([id number]) -------------------------------------------------------------------------------- Physician Orders Details Patient Name: Adam Merritt Date of Service: 11/07/2018 12:30 PM Medical Record Number: [id number] Patient Account Number: [id number] Date of Birth/Sex: Feb 02, 1946 (73 y.o. 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 Wound #21 Right,Anterior Lower Leg o Cleanse wound with mild soap and water o May Shower, gently pat wound dry prior to applying new dressing. Wound #22 Left,Circumferential Lower Leg o Cleanse wound with mild soap and water o May Shower, gently pat wound dry prior to applying new dressing. Skin Barriers/Peri-Wound Care Wound #20 Right,Posterior Lower Leg   o  Barrier cream Wound #21 Right,Anterior Lower Leg o Barrier cream Wound #22 Left,Circumferential Lower Leg o Barrier cream Primary Wound Dressing Wound #20 Right,Posterior Lower Leg o XtraSorb Wound #21 Right,Anterior Lower Leg o XtraSorb Wound #22 Left,Circumferential Lower Leg o XtraSorb Dressing Change Frequency Adam Merritt, Adam Merritt ([id number]) Wound #20 Right,Posterior Lower Leg o Dressing is to be changed Monday and Thursday. Wound #21 Right,Anterior Lower Leg o Dressing is to be changed Monday and Thursday. Wound #22 Left,Circumferential Lower Leg o Dressing is to be changed Monday and Thursday. Follow-up Appointments Wound #20 Right,Posterior Lower Leg o Return Appointment in 1 week. - Monday and Thursday o Nurse Visit as needed Wound #21 Right,Anterior Lower Leg o Return Appointment in 1 week. - Monday and Thursday o Nurse Visit as needed Wound #22 Left,Circumferential Lower Leg o Return Appointment in 1 week. - Monday and Thursday o Nurse Visit as needed Edema Control Wound #20 Right,Posterior Lower Leg o 4 Layer Compression System - Bilateral o Elevate legs to the level of the heart and pump ankles as often as possible Wound #21 Right,Anterior Lower Leg o 4 Layer Compression System - Bilateral o Elevate legs to the level of the heart and pump ankles as often as possible Wound #22 Left,Circumferential Lower Leg o 4 Layer Compression System - Bilateral o Elevate legs to the level of the heart and pump ankles as often as possible Additional Orders / Instructions Wound #20 Right,Posterior Lower Leg o Other: - Talk with PCP about getting a hospital bed to sleep in. Wound #21 Right,Anterior Lower Leg o Other: - Talk with PCP about getting a hospital bed to sleep in. Wound #22 Left,Circumferential Lower Leg o Other: - Talk with PCP about getting a hospital bed to sleep in. Electronic Signature(s) Signed: 11/07/2018 5:05:56 PM  By: Gretta Cool, BSN, RN, CWS, Kim RN, BSN Signed: 11/08/2018 4:38:37 PM By: Worthy Keeler PA-C Entered By: Gretta Cool BSN, RN, CWS, Kim on 11/07/2018 13:03:14 Adam Merritt ([id number]) -------------------------------------------------------------------------------- Problem List Details Patient Name: ULICE, FOLLETT Date of Service: 11/07/2018 12:30 PM Medical Record Number: [id number] Patient Account Number: [id number] Date of Birth/Sex: [birth year] (73 y.o. M) Treating RN: Cornell Barman Primary Care Provider: [name] Other Clinician: Referring Provider: [name] Treating Provider/Extender: Melburn Hake, [name]: 30 Active Problems ICD-10 Evaluated Encounter Code Description Active Date Today Diagnosis I89.0 Lymphedema, not elsewhere classified 04/09/2018 No Yes L97.512 Non-pressure chronic ulcer of other part of right foot with fat 05/28/2018 No Yes layer exposed L97.822 Non-pressure chronic ulcer of other part of left lower leg with 05/28/2018 No Yes fat layer exposed L98.492 Non-pressure chronic ulcer of skin of other sites with fat layer 06/11/2018 No Yes exposed E66.9 Obesity, unspecified 04/09/2018 No Yes Inactive Problems Resolved Problems ICD-10 Code Description Active Date Resolved Date L03.115 Cellulitis of right lower limb 04/09/2018 04/09/2018 L03.116 Cellulitis of left lower limb 04/09/2018 04/09/2018 Electronic Signature(s) Signed: 11/08/2018 4:38:37 PM By: Worthy Keeler PA-C Entered By: Worthy Keeler on 11/07/2018 12:53:28 Adam Merritt ([id number]) Adam Merritt, Adam Merritt ([id number]) -------------------------------------------------------------------------------- Progress Note Details Patient Name: Adam Merritt Date of Service: 11/07/2018 12:30 PM Medical Record Number: [id number] Patient Account Number: [id number] Date of Birth/Sex: 1945/10/08 (73 y.o. M) Treating RN: Cornell Barman Primary Care Provider: [name] Other Clinician: Referring Provider: [name] Treating Provider/Extender: Melburn Hake, [name]: 30 Subjective Chief Complaint Information obtained from Patient he is here for evaluation of bilateral lower extremity lymphedema right right toe and  left LE ulcers History of Present Illness (HPI) 10/18/17-He is here for initial evaluation of bilateral lower extremity ulcerations in the presence of venous insufficiency and lymphedema. He has been seen by vascular medicine in the past, Dr. Lucky Cowboy, last seen in 2016. He does have a history of abnormal ABIs, which is to be expected given his lymphedema and venous insufficiency. According to Epic, it appears that all attempts for arterial evaluation and/or angiography were not follow through with by patient. He does have a history of being seen in lymphedema clinic in 2018, stopped going approximately 6 months ago stating "it didn't do any good". He does not have lymphedema pumps, he does not have custom fit compression wrap/stockings. He is diabetic and his recent A1c last month was 7.6. He admits to chronic bilateral lower extremity pain, no change in pain since blister and ulceration development. He is currently being treated with Levaquin for bronchitis. He has home health and we will continue. 10/25/17-He is here in follow-up evaluation for bilateral lower extremity ulcerationssubtle he remains on Levaquin for bronchitis. Right lower extremity with no evidence of drainage or ulceration, persistent left lower extremity ulceration. He states that home health has not been out since his appointment. He went to [facility] on Tuesday, studies revealed: RIGHT ABI 0.9, TBI 0.6 LEFT ABI 1.1, TBI 0.6 with triphasic flow bilaterally. We will continue his same treatment plan. He has been educated on compression therapy and need for elevation. He will benefit from lymphedema pumps 11/01/17-He is here in follow-up evaluation for left lower extremity ulcer. The right lower  extremity remains healed. He has home health services, but they have not been out to see the patient for 2-3 weeks. He states it home health physical therapy changed his dressing yesterday after therapy; he placed Ace wrap compression. We are still waiting for lymphedema pumps, reordered d/t need for company change. 11/08/17-He is here in follow-up evaluation for left lower extremity ulcer. It is improved. Edema is significantly improved with compression therapy. We will continue with same treatment plan and he will follow-up next week. No word regarding lymphedema pumps 11/15/17-He is here in follow-up evaluation for left lower extremity ulcer. He is healed and will be discharged from wound care services. I have reached out to [facility] regarding his lymphedema pumps. They have been unable to reach the patient; the contact number they had with the patient's wife's cell phone and she has not answered any unrecognized calls. Contact should be made today, trial planned for next week; [facility] will continue to follow 11/27/17 on evaluation today patient has multiple blistered areas over the right lower extremity his left lower extremity appears to be doing okay. These blistered areas show signs of no infection which is great news. With that being said he did have some necrotic skin overlying which was mechanically debrided away with saline and gauze today without complication. Overall post debridement the wounds appear to be doing better but in general his swelling seems to be increased. This is obviously not good news. I think this is what has given rise to the blisters. 12/04/17 on evaluation today patient presents for follow-up concerning his bilateral lower extremity edema in the right lower extremity ulcers. He has been tolerating the dressing changes without complication. With that being said he has had no real issues with the wraps which is also good news. Overall I'm pleased with  the progress he's been making. 12/11/17 on evaluation today patient appears to be  doing rather well in regard to his right lateral lower extremity ulcer. He's been tolerating the dressing changes without complication. Fortunately there does not appear to be any evidence of infection at this time. Overall I'm pleased with the progress that is being made. Unfortunately he has been in the hospital due to having what sounds to be a stomach virus/flu fortunately that is starting to get better. 12/18/17 on evaluation today patient actually appears to be doing very well in regard to his bilateral lower extremities the Adam Merritt, Adam Merritt. ([id number]) swelling is under fairly good control his lymphedema pumps are still not up and running quite yet. With that being said he does have several areas of opening noted as far as wounds are concerned mainly over the left lower extremity. With that being said I do believe once he gets lymphedema pumps this would least help you mention some the fluid and preventing this from occurring. Hopefully that will be set up soon sleeves are Artie in place at his home he just waiting for the machine. 12/25/17 on evaluation today patient actually appears to be doing excellent in fact all of his ulcers appear to have resolved his legs appear very well. I do think he needs compression stockings we have discussed this and they are actually going to go to [place] today to elastic therapy to get this fitted for him. I think that is definitely a good thing to do. Readmission: 04/09/18 upon evaluation today this patient is seen for readmission due to bilateral lower extremity lymphedema. He has significant swelling of his extremities especially on the left although the right is also swollen he has weeping from both sides. There are no obvious open wounds at this point. Fortunately he has been doing fairly well for quite a bit of time since I last saw him. Nonetheless unfortunately this seems  to have reopened and is giving quite a bit of trouble. He states this began about a week ago when he first called [place] to get in to be seen. No fevers, chills, nausea, or vomiting noted at this time. He has not been using his lymphedema pumps due to the fact that they won't fit on his leg at this point likewise is also not been using his compression for essentially the same reason. 04/16/18 upon evaluation today patient actually appears to be doing a little better in regard to the fluid in his bilateral lower extremities. With that being said he's had three falls since I saw him last week. He also states that he's been feeling very poorly. I was concerned last week and feel like that the concern is still there as far as the congestion in his chest is concerned he seems to be breathing about the same as last week but again he states he's very weak he's not even able to walk further than from the chair to the door. His wife had to buy a wheelchair just to be able to get them out of the house to get to the appointment today. This has me very concerned. 04/23/18 on evaluation today patient actually appears to be doing much better than last week's evaluation. At that time actually had to transport him to the ER via EMS and he subsequently was admitted for acute pulmonary edema, acute renal failure, and acute congestive heart failure. Fortunately he is doing much better. Apparently they did dialyze him and were able to take off roughly 35 pounds of fluid. Nonetheless he is feeling much better both in  regard to his breathing and he's able to get around much better at this time compared to previous. Overall I'm very happy with how things are at this time. There does not appear to be any evidence of infection currently. No fevers, chills, nausea, or vomiting noted at this time. 04/30/2018 patient seen today for follow-up and management of bilateral lower extremity lymphedema. He did express being more sad  today than usual due to the recent loss of his dog. He states that he has been compliant with using the lymphedema pumps. However he does admit a minute over the last 2-3 days he has not been using the pumps due to the recent loss of his dog. At this time there is no drainage or open wounds to his lower extremities. The left leg edema is measuring smaller today. Still has a significant amount of edema on bilateral lower extremities With dry flaky skin. He will be referred to the lymphedema clinic for further management. Will continue 3 layer compression wraps and follow-up in 1-2 weeks.Denies any pain, fever, chairs recently. No recent falls or injuries reported during this visit. 05/07/18 on evaluation today patient actually appears to be doing very well in regard to his lower extremities in general all things considered. With that being said he is having some pain in the legs just due to the amount of swelling. He does have an area where he had a blister on the left lateral lower extremity this is open at this point other than that there's nothing else weeping at this time. 05/14/18 on evaluation today patient actually appears to be doing excellent all things considered in regard to his lower extremities. He still has a couple areas of weeping on each leg which has continued to be the issue for him. He does have an appointment with the lymphedema clinic although this isn't until February 2020. That was the earliest they had. In the meantime he has continued to tolerate the compression wraps without complication. 05/28/18 on evaluation today patient actually appears to be doing more poorly in regard to his left lower extremity where he has a wound open at this point. He also had a fall where he subsequently injured his right great toe which has led to an open wound at the site unfortunately. He has been tolerating the dressing changes without complication in general as far as the wraps are concerned  that he has not been putting any dressing on the left 1st toe ulcer site. 06/11/18 on evaluation today patient appears to be doing much worse in regard to his bilateral lower extremity ulcers. He has been tolerating the dressing changes without complication although his legs have not been wrapped more recently. Overall I am not very pleased with the way his legs appear. I do believe he needs to be back in compression wraps he still has not received his compression wraps from the [facility] hospital as of yet. Adam Merritt, Adam Merritt ([id number]) 06/18/18 on evaluation today patient actually appears to be doing significantly better than last time I saw him. He has been tolerating the compression wraps without complication in the circumferential ulcers especially appear to be doing much better. His toe ulcer on the right in regard to the great toe is better although not as good as the legs in my pinion. No fevers chills noted 07/02/18 on evaluation today patient appears to be doing much better in regard to his lower extremity ulcers. Unfortunately since I last saw him he's had the distal portion  of his right great toe if he dated it sounds as if this actually went downhill very quickly. I had only seen him a few days prior and the toe did not appear to be infected at that point subsequently became infected very rapidly and it was decided by the surgeon that the distal portion of the toe needed to be removed. The patient seems to be doing well in this regard he tells me. With that being said his lower extremities are doing better from the standpoint of the wounds although he is significantly swollen at this point. 07/09/18 on evaluation today patient appears to be doing better in regard to the wounds on his lower extremities. In fact everything is almost completely healed he is just a small area on the left posterior lower extremity that is open at this point. 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 With that being said he needs some kind of compression it does sound like he may not be wearing his compression, that is the wraps, during the entire time between when he's here visit to visit. Apparently his wife took the current one off because it began to "fall apart". 07/16/18 on evaluation today patient appears to be extremely swollen especially in regard to his left lower extremity unfortunately. He also has a new skin tear over the left lower extremity and there's a smaller area on the right lower extremity as well. Unfortunately this seems to be due in part to blistering and fluid buildup in his leg. He did get the reduction wraps that were ordered by the [facility] hospital for him to go to lymphedema clinic. With that being said his wounds on the legs have not healed to the point to where they would likely accept them as a patient lymphedema clinic currently. We need to try to get this to heal. With that being said he's been taking his wraps off which is not doing him any favors at this point. In fact this is probably quite counterproductive compared to what needs to occur. We will likely need to increase to a four layer compression wrap and continue to also utilize elevation and he has to keep the wraps on not take them off as he's been doing currently hasn't had a wrap on since Saturday. 07/23/18 on evaluation today patient appears to be doing much better in regard to his bilateral lower extremities. In fact his left lower extremity which was the largest is actually 15 cm smaller today compared to what it was last time he was here in our clinic. This is obviously good news after just one week. Nonetheless the differences he actually kept the wraps on during the entire week this time. That's not typical for him. I do believe he  understands a little bit better now the severity of the situation and why it's important for him to keep these wraps on. 07/30/18 on evaluation today patient actually appears to be doing rather well in regard to his lower extremities. His legs are much smaller than they have been in the past and he actually has only one very small rudder superficial region remaining that is not closed on the left lateral/posterior lower extremity even this is almost completely close. I do believe likely next week he will be healed without any complications. I do think we  need to continue the wraps however this seems to be beneficial for him. I also think it may be a good time for [place] to go ahead and see about getting the appointment with the lymphedema clinic which is supposed to be made for him in order to keep this moving along and hopefully get them into compression wraps that will in the end help him to remain healed. 08/06/18 on evaluation today patient actually appears to be doing very well in regard as bilateral lower extremities. In fact his wounds appear to be completely healed at this time. 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 He is in the process of getting appointment with the lymphedema clinic we have made this referral were just waiting to hear back on the schedule time. We need to follow up on that today as well. 08/13/18 on evaluation today patient actually appears to be doing very well in regard to his bilateral lower extremities there are no open wounds at this point. We are gonna go ahead and see about ordering the Velcro compression wraps for him had a discussion with them about [place] doing it versus the [place] they feel like they can definitely afford going ahead and get the wraps themselves and they would prefer to try to avoid having to go to the lymphedema clinic if it all possible which I completely understand. As long as he has good  compression I'm okay either way. 3/17//20 on evaluation today patient actually appears to be doing well in regard to his bilateral lower extremity ulcers. He has been tolerating the dressing changes without complication specifically the compression wraps. Overall is had no issues my fingers finding that I see at this point is that he is having trouble with constipation. He tells me he has not been able to go to the bathroom for about six days. He's taken to over-the-counter oral laxatives unfortunately this is not helping. He has not contacted his doctor. Adam Merritt, Adam Merritt ([id number]) 08/27/18 on evaluation today patient appears to be doing fairly well in regard to his lower extremities at this point. There does not appear to be any new altars is swelling is very well controlled. We are still waiting for his Velcro compression wraps to arrive that should be sometime in the next week is Artie sent the check for these. 09/03/18 on evaluation today patient appears to be doing excellent in regard to his bilateral lower extremities which he shows no signs of wound openings in regard to this point. He does have his Velcro compression wraps which did arrive in the mail since I saw him last week. Overall he is doing excellent in my pinion. 09/13/18 on evaluation today patient appears to be doing very well currently in regard to the overall appearance of his bilateral lower extremities although he's a little bit more swollen than last time we saw him. At that point he been discharged without any open wounds. Nonetheless he has a small open wound on the posterior left lower extremity with some evidence of cellulitis noted as well. Fortunately I feel like he has made good progress overall with regard to his lower extremities from were things used to be. 09/20/18 on evaluation today patient actually appears to be doing much better. The erythematous lower extremity is improving wound itself which is still open  appears to be doing much better as far as but appearance as well as pain is concerned overall very pleased in this  regard. There's no signs of active infection at this time. 09/27/18 on evaluation today patient's wounds on the lower extremity actually appear to be doing fairly well at this time which is good news. There is no evidence of active infection currently and again is just as left lower extremity were there any wounds at this point anyway. I believe they may be completely healed but again I'm not 100% sure based on evaluation today. I think one more week of observation would likely be a good idea. 10/04/18 on evaluation today patient actually appears to be doing excellent in regard to the left lower extremity which actually appears to be completely healed as of today. Unfortunately he's been having issues with his right lower extremity have a new wound that has opened. Fortunately there's no evidence of active infection at this time which is good news. 10/10/18 upon evaluation today patient actually appears to be doing a little bit worse with the new open area on his right posterior lower extremity. He's been tolerating the dressing changes without complication. Right now we been using his compression wraps although I think we may need to switch back to actually performing bilateral compression wraps are in the clinic. No fevers, chills, nausea, or vomiting noted at this time. 10/17/18 on evaluation today patient actually appears to be doing quite well in regard to his bilateral lower extremity ulcers. He has been tolerating the reps without complication although he would prefer not to rewrap his legs as of today. Fortunately there's no signs of active infection which is good news. No fevers, chills, nausea, or vomiting noted at this time. 10/24/18 on evaluation today patient appears to be doing very well in regard to his lower extremities. His right lower extremity is shown signs of healing and  his left lower Trinity though not healed appears to be improving which is excellent news. Overall very pleased with how things seem to be progressing at this time. The patient is likewise happy to hear this. His Velcro compression wraps however have not been put on properly will gonna show his wife how to do that properly today. 10/31/18 on evaluation today patient appears to be doing more poorly in regard to his left lower extremity in particular although both lower extremities actually are showing some signs of being worse than my previous evaluation. Unfortunately I'm just not sure that his compression stockings even with the use of the compression/lymphedema pumps seem to be controlling this well. Upon further questioning he tells me that he also is not able to lie flat in the bed due to his congestive heart failure and difficulty breathing. For that reason he sleeps in his recliner. To make matters worse his recliner also cannot even hold his legs up so instead of even being somewhat elevated they pretty much hang down to the floor. This is the way he sleeps each night which is definitely counterproductive to everything else that were attempting to do from the standpoint of controlling his fluid. Nonetheless I think that he potentially could benefit from a hospital bed although this would be something that his primary care provider would likely have to order since anything that is order on our side has to be directly related to wound care and again the hospital bed is not necessarily a direct relation to although I think it does contribute to his overall wound status on his lower extremities. 11/07/18 on evaluation today patient appears to be doing better in regard to his bilateral lower extremity.  He's been tolerating the dressing changes without complication. We did in the interim since I last saw him switch to just using extras orbiting new alginate and that seems to have done much better for  him. I'm very pleased with the overall progress that is made. Patient History FAIZAN, GERACI ([id number]) Information obtained from Patient. Family History Cancer - Paternal Grandparents, Kidney Disease - Siblings, Lung Disease - Mother, No family history of Diabetes, Heart Disease, Hereditary Spherocytosis, Hypertension, Seizures, Stroke, Thyroid Problems, Tuberculosis. Social History Never smoker, Marital Status - Married, Alcohol Use - Never, Drug Use - No History, Caffeine Use - Never. Medical History Eyes Denies history of Cataracts, Glaucoma, Optic Neuritis Ear/Nose/Mouth/Throat Denies history of Chronic sinus problems/congestion, Middle ear problems Hematologic/Lymphatic Patient has history of Anemia - aplastic anemia, Lymphedema Denies history of Hemophilia, Human Immunodeficiency Virus, Sickle Cell Disease Respiratory Patient has history of Chronic Obstructive Pulmonary Disease (COPD), Sleep Apnea Denies history of Aspiration, Asthma, Pneumothorax, Tuberculosis Cardiovascular Patient has history of Congestive Heart Failure, Coronary Artery Disease, Hypertension, Peripheral Venous Disease Denies history of Angina, Arrhythmia, Deep Vein Thrombosis, Hypotension, Myocardial Infarction, Peripheral Arterial Disease, Phlebitis, Vasculitis Gastrointestinal Denies history of Cirrhosis , Colitis, Crohn s, Hepatitis A, Hepatitis B, Hepatitis C Endocrine Patient has history of Type II Diabetes Denies history of Type I Diabetes Genitourinary Denies history of End Stage Renal Disease Immunological Denies history of Lupus Erythematosus, Raynaud s, Scleroderma Integumentary (Skin) Denies history of History of Burn, History of pressure wounds Musculoskeletal Patient has history of Osteoarthritis Denies history of Gout, Rheumatoid Arthritis, Osteomyelitis Neurologic Patient has history of Neuropathy Denies history of Dementia, Quadriplegia, Paraplegia, Seizure  Disorder Oncologic Patient has history of Received Chemotherapy - last dose 2006 Denies history of Received Radiation Psychiatric Denies history of Anorexia/bulimia, Confinement Anxiety Hospitalization/Surgery History - bronchitis. Medical And Surgical History Notes Respiratory home O2 at night as needed Cardiovascular varicose veins Neurologic CVA - 1992 Review of Systems (ROS) Constitutional Symptoms (General Health) Adam Merritt, Adam Merritt ([id number]) Denies complaints or symptoms of Fatigue, Fever, Chills, Marked Weight Change. Respiratory Denies complaints or symptoms of Chronic or frequent coughs, Shortness of Breath. Cardiovascular Denies complaints or symptoms of Chest pain, LE edema. Psychiatric Denies complaints or symptoms of Anxiety, Claustrophobia. Objective Constitutional Well-nourished and well-hydrated in no acute distress. Vitals Time Taken: 12:40 PM, Height: 66 in, Weight: 323 lbs, BMI: 52.1, Temperature: 98.0 F, Pulse: 68 bpm, Respiratory Rate: 16 breaths/min, Blood Pressure: 136/67 mmHg. Respiratory normal breathing without difficulty. clear to auscultation bilaterally. Cardiovascular regular rate and rhythm with normal S1, S2. Psychiatric this patient is able to make decisions and demonstrates good insight into disease process. Alert and Oriented x 3. pleasant and cooperative. General Notes: Upon inspection today patient's wounds and to show signs of fairly significant improvement at this point. There does not appear to be any signs of active infection at this time which is good news. Overall very pleased with how things have been appearing. Integumentary (Hair, Skin) Wound #20 status is Open. Original cause of wound was Gradually Appeared. The wound is located on the Right,Posterior Lower Leg. The wound measures 0cm length x 0cm width x 0cm depth; 0cm^2 area and 0cm^3 volume. The wound is limited to skin breakdown. There is no tunneling or undermining noted.  There is a none present amount of drainage noted. The wound margin is flat and intact. There is no granulation within the wound bed. There is no necrotic tissue within the wound bed. Wound #21 status is Open. Original cause  of wound was Gradually Appeared. The wound is located on the Right,Anterior Lower Leg. The wound measures 0cm length x 0cm width x 0cm depth; 0cm^2 area and 0cm^3 volume. The wound is limited to skin breakdown. There is no tunneling or undermining noted. There is a none present amount of drainage noted. The wound margin is flat and intact. There is no granulation within the wound bed. There is no necrotic tissue within the wound bed. Wound #22 status is Open. Original cause of wound was Gradually Appeared. The wound is located on the Left,Circumferential Lower Leg. The wound measures 4.3cm length x 40cm width x 0.1cm depth; 135.088cm^2 area and 13.509cm^3 volume. The wound is limited to skin breakdown. There is no tunneling or undermining noted. There is a large amount of serous drainage noted. The wound margin is flat and intact. There is large (67-100%) pink granulation within the wound bed. There is no necrotic tissue within the wound bed. 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 Wound #21 Right,Anterior Lower Leg: Cleanse wound with mild soap and water May Shower, gently pat wound dry prior to applying new dressing. Wound #22 Left,Circumferential Lower Leg: Cleanse wound with mild soap and  water May Shower, gently pat wound dry prior to applying new dressing. Skin Barriers/Peri-Wound Care: Wound #20 Right,Posterior Lower Leg: Barrier cream Wound #21 Right,Anterior Lower Leg: Barrier cream Wound #22 Left,Circumferential Lower Leg: Barrier cream Primary Wound Dressing: Wound #20 Right,Posterior Lower Leg: XtraSorb Wound #21 Right,Anterior Lower Leg: XtraSorb Wound #22 Left,Circumferential Lower Leg: XtraSorb Dressing Change Frequency: Wound #20 Right,Posterior Lower Leg: Dressing is to be changed Monday and Thursday. Wound #21 Right,Anterior Lower Leg: Dressing is to be changed Monday and Thursday. Wound #22 Left,Circumferential Lower Leg: Dressing is to be changed Monday and Thursday. Follow-up Appointments: Wound #20 Right,Posterior Lower Leg: Return Appointment in 1 week. - Monday and Thursday Nurse Visit as needed Wound #21 Right,Anterior Lower Leg: Return Appointment in 1 week. - Monday and Thursday Nurse Visit as needed Adam Merritt, Adam Merritt ([id number]) Wound #22 Left,Circumferential Lower Leg: Return Appointment in 1 week. - Monday and Thursday Nurse Visit as needed Edema Control: Wound #20 Right,Posterior Lower Leg: 4 Layer Compression System - Bilateral Elevate legs to the level of the heart and pump ankles as often as possible Wound #21 Right,Anterior Lower Leg: 4 Layer Compression System - Bilateral Elevate legs to the level of the heart and pump ankles as often as possible Wound #22 Left,Circumferential Lower Leg: 4 Layer Compression System - Bilateral Elevate legs to the level of the heart and pump ankles as often as possible Additional Orders / Instructions: Wound #20 Right,Posterior Lower Leg: Other: - Talk with PCP about getting a hospital bed to sleep in. Wound #21 Right,Anterior Lower Leg: Other: - Talk with PCP about getting a hospital bed to sleep in. Wound #22 Left,Circumferential Lower Leg: Other: - Talk with PCP about getting a hospital  bed to sleep in. My suggestion currently is gonna be that we go ahead and continue with the above wound care measures for the next week and the patient  is in agreement with plan. We will subsequently see were things stand at follow-up. If anything changes or worsens the meantime he will contact the office and let me know. Please see above for specific wound care orders. We will see patient for re-evaluation in 1 week(s) here in the clinic. If anything worsens or changes patient will contact our office for additional recommendations. Electronic Signature(s) Signed: 11/08/2018 4:38:37 PM By: Worthy Keeler PA-C Entered By: Worthy Keeler on 11/07/2018 13:40:15 Adam Merritt ([id number]) -------------------------------------------------------------------------------- ROS/PFSH Details Patient Name: Adam Merritt Date of Service: 11/07/2018 12:30 PM Medical Record Number: [id number] Patient Account Number: [id number] Date of Birth/Sex: 22-Aug-1945 (73 y.o. M) Treating RN: Cornell Barman Primary Care Provider: [name] Other Clinician: Referring Provider: [name] Treating Provider/Extender: Melburn Hake, [name]: 30 Information Obtained From Patient Constitutional Symptoms (General Health) Complaints and Symptoms: Negative for: Fatigue; Fever; Chills; Marked Weight Change Respiratory Complaints and Symptoms: Negative for: Chronic or frequent coughs; Shortness of Breath Medical History: Positive for: Chronic Obstructive Pulmonary Disease (COPD); Sleep Apnea Negative for: Aspiration; Asthma; Pneumothorax; Tuberculosis Past Medical History Notes: home O2 at night as needed Cardiovascular Complaints and Symptoms: Negative for: Chest pain; LE edema Medical History: Positive for: Congestive Heart Failure; Coronary Artery Disease; Hypertension; Peripheral Venous Disease Negative for: Angina; Arrhythmia; Deep Vein Thrombosis; Hypotension; Myocardial Infarction; Peripheral  Arterial Disease; Phlebitis; Vasculitis Past Medical History Notes: varicose veins Psychiatric Complaints and Symptoms: Negative for: Anxiety; Claustrophobia Medical History: Negative for: Anorexia/bulimia; Confinement Anxiety Eyes Medical History: Negative for: Cataracts; Glaucoma; Optic Neuritis Ear/Nose/Mouth/Throat Medical History: Negative for: Chronic sinus problems/congestion; Middle ear problems Hematologic/Lymphatic Adam Merritt, Adam Merritt ([id number]) Medical History: Positive for: Anemia - aplastic anemia; Lymphedema Negative for: Hemophilia; Human Immunodeficiency Virus; Sickle Cell Disease Gastrointestinal Medical History: Negative for: Cirrhosis ; Colitis; Crohnos; Hepatitis A; Hepatitis B; Hepatitis C Endocrine Medical History: Positive for: Type II Diabetes Negative for: Type I Diabetes Time with diabetes: since 2006 Treated with: Insulin Blood sugar tested every day: Yes Tested : Blood sugar testing results: Breakfast: 209 Genitourinary Medical History: Negative for: End Stage Renal Disease Immunological Medical History: Negative for: Lupus Erythematosus; Raynaudos; Scleroderma Integumentary (Skin) Medical History: Negative for: History of Burn; History of pressure wounds Musculoskeletal Medical History: Positive for: Osteoarthritis Negative for: Gout; Rheumatoid Arthritis; Osteomyelitis Neurologic Medical History: Positive for: Neuropathy Negative for: Dementia; Quadriplegia; Paraplegia; Seizure Disorder Past Medical History Notes: CVA - 1992 Oncologic Medical History: Positive for: Received Chemotherapy - last dose 2006 Negative for: Received Radiation Immunizations Pneumococcal Vaccine: Received Pneumococcal Vaccination: Yes JAGDEEP, ANCHETA ([id number]) Implantable Devices No devices added Hospitalization / Surgery History Type of Hospitalization/Surgery bronchitis Family and Social History Cancer: Yes - Paternal Grandparents; Diabetes: No;  Heart Disease: No; Hereditary Spherocytosis: No; Hypertension: No; Kidney Disease: Yes - Siblings; Lung Disease: Yes - Mother; Seizures: No; Stroke: No; Thyroid Problems: No; Tuberculosis: No; Never smoker; Marital Status - Married; Alcohol Use: Never; Drug Use: No History; Caffeine Use: Never; Financial Concerns: No; Food, Clothing or Shelter Needs: No; Support System Lacking: No; Transportation Concerns: No Physician Affirmation I have reviewed and agree with the above information. Electronic Signature(s) Signed: 11/07/2018 5:05:56 PM By: Gretta Cool, BSN, RN, CWS, Kim RN, BSN Signed: 11/08/2018 4:38:37 PM By: Worthy Keeler PA-C Entered By: Worthy Keeler on 11/07/2018 13:37:36 Adam Merritt ([id number]) -------------------------------------------------------------------------------- SuperBill Details Patient Name: Adam Merritt Date of Service: 11/07/2018 Medical Record Number: [id number] Patient Account Number: [id number] Date of Birth/Sex: 1945-07-03 (73 y.o. M) Treating  RN: Cornell Barman Primary Care Provider: [name] Other Clinician: Referring Provider: [name] Treating Provider/Extender: Melburn Hake, [name]: 30 Diagnosis Coding ICD-10 Codes Code Description I89.0 Lymphedema, not elsewhere classified L97.512 Non-pressure chronic ulcer of other part of right foot with fat layer exposed L97.822 Non-pressure chronic ulcer of other part of left lower leg with fat layer exposed L98.492 Non-pressure chronic ulcer of skin of other sites with fat layer exposed E66.9 Obesity, unspecified Facility Procedures CPT4: Description Modifier Quantity Code [id number] BILATERAL: Application of multi-layer venous compression system; leg (below 1 knee), including ankle and foot. Physician Procedures CPT4 Code Description: [id number] - WC PHYS LEVEL 4 - EST PT ICD-10 Diagnosis Description I89.0 Lymphedema, not elsewhere classified L97.512 Non-pressure chronic ulcer of other part  of right foot with fat L97.822 Non-pressure chronic ulcer of other  part of left lower leg with L98.492 Non-pressure chronic ulcer of skin of other sites with fat layer Modifier: layer exposed fat layer expos exposed Quantity: 1 ed Electronic Signature(s) Signed: 11/08/2018 4:38:37 PM By: Worthy Keeler PA-C Entered By: Worthy Keeler on 11/07/2018 13:40:27

## 2018-11-08 NOTE — Progress Notes (Signed)
DERRY, KASSEL ([id number]) Visit Report for 11/07/2018 Arrival Information Details Patient Name: Adam Merritt, Adam Merritt Date of Service: 11/07/2018 12:30 PM Medical Record Number: [id number] Patient Account Number: [id number] Date of Birth/Sex: [birth year] (73 y.o. M) Treating RN: Montey Hora Primary Care Justus Droke: [name] Other Clinician: Referring Suriyah Vergara: [name] Treating Zackariah Vanderpol/Extender: Melburn Hake, [name]: 35 Visit Information History Since Last Visit Added or deleted any medications: No Patient Arrived: Walker Any new allergies or adverse reactions: No Arrival Time: 12:36 Had a fall or experienced change in No Accompanied By: self activities of daily living that may affect Transfer Assistance: None risk of falls: Patient Identification Verified: Yes Signs or symptoms of abuse/neglect since last visito No Secondary Verification Process Yes Hospitalized since last visit: No Completed: Implantable device outside of the clinic excluding No Patient Has Alerts: Yes cellular tissue based products placed in the center Patient Alerts: Patient on Blood since last visit: Thinner Has Dressing in Place as Prescribed: Yes Aspirin 325mg  Has Compression in Place as Prescribed: Yes Type II Diabetes ABI 5/19 L 1.1 R 0.9 Pain Present Now: No TBI L 0.6 R 0.6 Electronic Signature(s) Signed: 11/07/2018 12:57:12 PM By: Montey Hora Entered By: Montey Hora on 11/07/2018 12:57:11 Adam Merritt ([id number]) -------------------------------------------------------------------------------- Encounter Discharge Information Details Patient Name: Adam Merritt Date of Service: 11/07/2018 12:30 PM Medical Record Number: [id number] Patient Account Number: [id number] Date of Birth/Sex: [birth year] (73 y.o. M) Treating RN: Harold Barban Primary Care Kartel Wolbert: [name] Other Clinician: Referring Jarrel Knoke: [name] Treating Anahla Bevis/Extender: Melburn Hake, [name] in  Treatment: 30 Encounter Discharge Information Items Discharge Condition: Stable Ambulatory Status: Walker Discharge Destination: Home Transportation: Private Auto Accompanied By: wife Schedule Follow-up Appointment: Yes Clinical Summary of Care: Electronic Signature(s) Signed: 11/07/2018 4:49:02 PM By: Harold Barban Entered By: Harold Barban on 11/07/2018 13:17:12 Adam Merritt ([id number]) -------------------------------------------------------------------------------- Lower Extremity Assessment Details Patient Name: Adam Merritt Date of Service: 11/07/2018 12:30 PM Medical Record Number: [id number] Patient Account Number: [id number] Date of Birth/Sex: [birth year] (73 y.o. M) Treating RN: Montey Hora Primary Care Salimah Martinovich: [name] Other Clinician: Referring Idella Lamontagne: [name] Treating April Colter/Extender: Melburn Hake, [name]: 30 Edema Assessment Assessed: [Left: No] [Right: No] Edema: [Left: Yes] [Right: Yes] Calf Left: Right: Point of Measurement: 37 cm From Medial Instep 57 cm 53 cm Ankle Left: Right: Point of Measurement: 12 cm From Medial Instep 33 cm 29.3 cm Vascular Assessment Pulses: Dorsalis Pedis Palpable: [Left:Yes] [Right:Yes] Electronic Signature(s) Signed: 11/07/2018 12:59:00 PM By: Montey Hora Entered By: Montey Hora on 11/07/2018 12:59:00 Adam Merritt ([id number]) -------------------------------------------------------------------------------- Multi Wound Chart Details Patient Name: Adam Merritt Date of Service: 11/07/2018 12:30 PM Medical Record Number: [id number] Patient Account Number: [id number] Date of Birth/Sex: May 18, 1946 (73 y.o. 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 Lower Extremity Comorbid History: Anemia, Lymphedema, N/A Anemia, Lymphedema, Chronic Obstructive Chronic Obstructive Pulmonary Disease (COPD), Pulmonary Disease (COPD), Sleep Apnea, Congestive Sleep Apnea, Congestive Heart Failure, Coronary Artery Heart Failure, Coronary Artery Disease, Hypertension, Disease, Hypertension, Peripheral Venous Disease, Peripheral Venous Disease, Type  II Diabetes, Type II Diabetes, Osteoarthritis, Neuropathy, Osteoarthritis, Neuropathy, Received Chemotherapy Received Chemotherapy Date Acquired: 10/02/2018 10/25/2018 10/25/2018 Weeks of Treatment: 4 1 1  Wound Status: Open Open Open Measurements L x W x D 0x0x0 0x0x0 4.3x40x0.1 (cm) Area (cm) : 0 0 135.088 Adam Merritt, STUHR. ([id number]) Volume (cm) : 0 0 13.509 % Reduction in Area: 100.00% 100.00% 6.80% % Reduction in Volume: 100.00% 100.00% 6.80% Classification: Grade 2 Partial Thickness Partial Thickness Exudate Amount: None Present N/A Large Exudate Type: N/A N/A Serous Exudate Color: N/A N/A amber Wound Margin: Flat and Intact N/A Flat and Intact Granulation Amount: None Present (0%) N/A Large (67-100%) Granulation Quality: N/A N/A Pink Necrotic Amount: None Present (0%) N/A None Present (0%) Exposed Structures: Fascia: No N/A Fascia: No Fat Layer (Subcutaneous Fat Layer (Subcutaneous Tissue) Exposed: No Tissue) Exposed: No Tendon: No Tendon: No Muscle: No Muscle: No Joint: No Joint: No Bone: No Bone: No Limited to Skin Breakdown Limited to Skin Breakdown Epithelialization: Large (67-100%) N/A Medium (34-66%) Treatment Notes Electronic  Signature(s) Signed: 11/07/2018 5:05:56 PM By: Gretta Cool, BSN, RN, CWS, Kim RN, BSN Entered By: Gretta Cool, BSN, RN, CWS, Kim on 11/07/2018 13:01:16 Adam Merritt ([id number]) -------------------------------------------------------------------------------- [place] Details Patient Name: Adam Merritt Date of Service: 11/07/2018 12:30 PM Medical Record Number: [id number] Patient Account Number: [id number] Date of Birth/Sex: [birth year] (73 y.o. M) Treating RN: Cornell Barman Primary Care Mairely Foxworth: [name] Other Clinician: Referring Latisa Belay: [name] Treating Jeanpierre Thebeau/Extender: Melburn Hake, [name]: 19 Active Inactive Abuse / Safety / Falls / Self Care Management Nursing Diagnoses: Potential for falls Goals: Patient will remain injury free related to falls Date Initiated: 04/09/2018 Target Resolution Date: 10/05/2018 Goal Status: Active Interventions: Assess fall risk on admission and as needed Notes: Venous Leg Ulcer Nursing Diagnoses: Actual venous Insuffiency (use after diagnosis is confirmed) Goals: Patient will maintain optimal edema control Date Initiated: 04/09/2018 Target Resolution Date: 10/05/2018 Goal Status: Active Interventions: Assess peripheral edema status every visit. Compression as ordered Provide education on venous insufficiency Notes: Electronic Signature(s) Signed: 11/07/2018 5:05:56 PM By: Gretta Cool, BSN, RN, CWS, Kim RN, BSN Entered By: Gretta Cool, BSN, RN, CWS, Kim on 11/07/2018 13:01:05 Adam Merritt ([id number]) -------------------------------------------------------------------------------- Pain Assessment Details Patient Name: Adam Merritt Date of Service: 11/07/2018 12:30 PM Medical Record Number: [id number] Patient Account Number: [id number] Date of Birth/Sex: 12-01-1945 (73 y.o. M) Treating RN: Montey Hora Primary Care Tung Pustejovsky: [name] Other Clinician: Referring Ravi Tuccillo: [name] Treating Daisee Centner/Extender: Melburn Hake, [name]: 30 Active Problems Location of Pain Severity and Description of Pain Patient Has Paino No Site Locations Pain Management and Medication Current Pain Management: Electronic Signature(s) Signed: 11/07/2018 12:57:20 PM By: Montey Hora Entered By: Montey Hora on 11/07/2018 12:57:20 Adam Merritt ([id number]) -------------------------------------------------------------------------------- Patient/Caregiver Education Details Patient Name: Adam Merritt Date of Service: 11/07/2018 12:30 PM Medical Record Number: [id number] Patient Account Number: [id number] Date of Birth/Gender: [birth year] (73 y.o. M) Treating RN: Cornell Barman Primary Care Physician: [name] Other Clinician: Referring Physician: [name] Treating Physician/Extender: Sharalyn Ink in Treatment: 30 Education Assessment Education Provided To: Patient Education Topics Provided Venous: Handouts: Controlling Swelling with Multilayered Compression Wraps Methods: Demonstration, Explain/Verbal Responses: State content correctly Electronic Signature(s) Signed: 11/07/2018 5:05:56 PM By: Gretta Cool, BSN, RN, CWS, Kim RN, BSN Entered By: Gretta Cool, BSN, RN, CWS, Kim on 11/07/2018 13:04:00 Adam Merritt ([id number]) -------------------------------------------------------------------------------- Wound Assessment Details Patient Name: Adam Merritt Date of Service: 11/07/2018 12:30 PM Medical Record Number: [id number]  Patient Account Number: [id number] Date of Birth/Sex: [birth year] (73 y.o. M) Treating RN: Montey Hora Primary Care Argelio Granier: [name] Other Clinician: Referring Zakyra Kukuk: [name] Treating Mang Hazelrigg/Extender: STONE III, [name]: 30 Wound Status Wound Number: 20 Primary Diabetic Wound/Ulcer of the Lower Extremity Etiology: Wound Location: Right Lower Leg - Posterior Wound Open Wounding Event: Gradually Appeared Status: Date Acquired:  10/02/2018 Comorbid Anemia, Lymphedema, Chronic Obstructive Weeks Of Treatment: 4 History: Pulmonary Disease (COPD), Sleep Apnea, Clustered Wound: No Congestive Heart Failure, Coronary Artery Disease, Hypertension, Peripheral Venous Disease, Type II Diabetes, Osteoarthritis, Neuropathy, Received Chemotherapy Photos Wound Measurements Length: (cm) 0 % Reductio Width: (cm) 0 % Reductio Depth: (cm) 0 Epithelial Area: (cm) 0 Tunneling Volume: (cm) 0 Undermini n in Area: 100% n in Volume: 100% ization: Large (67-100%) : No ng: No Wound Description Classification: Grade 2 Foul Odor Wound Margin: Flat and Intact Slough/Fi Exudate Amount: None Present After Cleansing: No brino Yes Wound Bed Granulation Amount: None Present (0%) Exposed Structure Necrotic Amount: None Present (0%) Fascia Exposed: No Fat Layer (Subcutaneous Tissue) Exposed: No Tendon Exposed: No Muscle Exposed: No Joint Exposed: No Bone Exposed: No Limited to [address] Adam Merritt, Adam Merritt ([id number]) Electronic Signature(s) Signed: 11/07/2018 1:01:01 PM By: Montey Hora Entered By: Montey Hora on 11/07/2018 13:01:00 Adam Merritt ([id number]) -------------------------------------------------------------------------------- Wound Assessment Details Patient Name: Adam Merritt Date of Service: 11/07/2018 12:30 PM Medical Record Number: [id number] Patient Account Number: [id number] Date of Birth/Sex: [birth year] (73 y.o. M) Treating RN: Montey Hora Primary Care Amiayah Giebel: [name] Other Clinician: Referring Corissa Oguinn: [name] Treating Shailee Foots/Extender: Melburn Hake, [name]: 30 Wound Status Wound Number: 21 Primary Lymphedema Etiology: Wound Location: Right Lower Leg - Anterior Wound Open Wounding Event: Gradually Appeared Status: Date Acquired: 10/25/2018 Comorbid Anemia, Lymphedema, Chronic Obstructive Weeks Of Treatment: 1 History: Pulmonary Disease (COPD), Sleep  Apnea, Clustered Wound: No Congestive Heart Failure, Coronary Artery Disease, Hypertension, Peripheral Venous Disease, Type II Diabetes, Osteoarthritis, Neuropathy, Received Chemotherapy Photos Wound Measurements Length: (cm) 0 % Reductio Width: (cm) 0 % Reductio Depth: (cm) 0 Epithelial Area: (cm) 0 Tunneling Volume: (cm) 0 Undermini n in Area: 100% n in Volume: 100% ization: Large (67-100%) : No ng: No Wound Description Classification: Partial Thickness Foul Odor Wound Margin: Flat and Intact Slough/Fi Exudate Amount: None Present After Cleansing: No brino No Wound Bed Granulation Amount: None Present (0%) Exposed Structure Necrotic Amount: None Present (0%) Fascia Exposed: No Fat Layer (Subcutaneous Tissue) Exposed: No Tendon Exposed: No Muscle Exposed: No Joint Exposed: No Bone Exposed: No Limited to [address] Adam Merritt, Adam Merritt ([id number]) Electronic Signature(s) Signed: 11/07/2018 1:01:27 PM By: Montey Hora Entered By: Montey Hora on 11/07/2018 13:01:26 Adam Merritt ([id number]) -------------------------------------------------------------------------------- Wound Assessment Details Patient Name: Adam Merritt Date of Service: 11/07/2018 12:30 PM Medical Record Number: [id number] Patient Account Number: [id number] Date of Birth/Sex: December 09, 1945 (73 y.o. M) Treating RN: Montey Hora Primary Care Farrah Skoda: [name] Other Clinician: Referring Simren Popson: [name] Treating Shiryl Ruddy/Extender: Melburn Hake, [name]: 30 Wound Status Wound Number: 22 Primary Lymphedema Etiology: Wound Location: Left Lower Leg - Circumferential Wound Open Wounding Event: Gradually Appeared Status: Date Acquired: 10/25/2018 Comorbid Anemia, Lymphedema, Chronic Obstructive Weeks Of Treatment: 1 History: Pulmonary Disease (COPD), Sleep Apnea, Clustered Wound: No Congestive Heart Failure, Coronary Artery Disease, Hypertension, Peripheral  Venous Disease, Type II Diabetes, Osteoarthritis, Neuropathy, Received Chemotherapy Photos Wound Measurements Length: (cm) 4.3 Width: (cm) 40 Depth: (cm) 0.1 Area: (cm) 135.088 Volume: (cm) 13.509 %  Reduction in Area: 6.8% % Reduction in Volume: 6.8% Epithelialization: Medium (34-66%) Tunneling: No Undermining: No Wound Description Classification: Partial Thickness Foul Odo Wound Margin: Flat and Intact Slough/F Exudate Amount: Large Exudate Type: Serous Exudate Color: amber r After Cleansing: No ibrino No Wound Bed Granulation Amount: Large (67-100%) Exposed Structure Granulation Quality: Pink Fascia Exposed: No Necrotic Amount: None Present (0%) Fat Layer (Subcutaneous Tissue) Exposed: No Tendon Exposed: No Muscle Exposed: No Joint Exposed: No Bone Exposed: No Adam Merritt, Adam Merritt ([id number]) Limited to Skin Breakdown Electronic Signature(s) Signed: 11/07/2018 1:00:29 PM By: Montey Hora Entered By: Montey Hora on 11/07/2018 13:00:28 Adam Merritt ([id number]) -------------------------------------------------------------------------------- [place] Details Patient Name: Adam Merritt Date of Service: 11/07/2018 12:30 PM Medical Record Number: [id number] Patient Account Number: [id number] Date of Birth/Sex: Apr 29, 1946 (73 y.o. M) Treating RN: Montey Hora Primary Care Obdulia Steier: [name] Other Clinician: Referring Hatice Bubel: [name] Treating Reyanne Hussar/Extender: Melburn Hake, [name]: 30 Vital Signs Time Taken: 12:40 Temperature (F): 98.0 Height (in): 66 Pulse (bpm): 68 Weight (lbs): 323 Respiratory Rate (breaths/min): 16 Body Mass Index (BMI): 52.1 Blood Pressure (mmHg): 136/67 Reference Range: 80 - 120 mg / dl Electronic Signature(s) Signed: 11/07/2018 12:57:52 PM By: Montey Hora Entered By: Montey Hora on 11/07/2018 12:57:52

## 2018-11-08 NOTE — Progress Notes (Signed)
[facility] [place], [place] [address]  Internal MEDICINE  Office Visit Note  Patient Name: KIYOTO SLOMSKI  [id number]  [id number]  Date of Service: 11/13/2018  Chief Complaint  Patient presents with  . Medical Management of Chronic Issues    4wk follow up,medication refills, pt is requeseting rx for hospital bed    The patient is c/o increased pain in both legs.Fentanyl patch dose increased to 72mcg at his last visit, changed every 3 days. Has not helped at all, in fact, legs are more painful than at last visit. ,  He states that pain is about 8/10 in severity, can be as low as 5 or 6/10 in severity with the fentanyl patch. Would do better if pain was at level of 3/10 in severity.  I did increase the strength of his fentanyl patches to 6.35mcg/hour at his last visit, but this adjustment has not made much of a difference in tolerance of pain. He is back at wound care for chronic and recurring wounds to bilateral lowe legs. They are currently wrapped in clean dry dressings and pressure bandages, applied per wound clinic. They started him on doxycycline on 09/13/2018, which was first visit back with wound management . His blood pressure is well controlled as are blood sugars. His HgbA1c is improved today at 6.5.  The patient states that wound management has recommended he get a hospital bed. The patient is currently sleeping in his reclining chair most nights and does not put feet up, which is likely causing his foot pain to be more severe. Having a hospital bed will also reduce the change of further wounds erupting in the lower extremities.       Current Medication: Outpatient Encounter Medications as of 11/08/2018  Medication Sig  . albuterol (PROVENTIL HFA;VENTOLIN HFA) 108 (90 Base) MCG/ACT inhaler INHALE 1 PUFF INTO THE LUNGS EVERY 6 HOURS AS NEEDED FOR WHEEZING OR SHORTNESS OF BREATH (Patient taking differently: Inhale 1 puff into the lungs every 6 (six) hours as  needed for wheezing or shortness of breath. )  . aspirin 325 MG EC tablet Take 325 mg by mouth daily.  Marland Kitchen atorvastatin (LIPITOR) 40 MG tablet Take 40 mg by mouth at bedtime.   . carvedilol (COREG) 25 MG tablet Take 25 mg by mouth 2 (two) times daily with a meal.  . doxycycline (VIBRAMYCIN) 100 MG capsule TK ONE C PO BID FOR 10 DAYS. DO NOT TK MULTIVITAMIN WITH THIS MEDICATION  . febuxostat (ULORIC) 40 MG tablet Take 1 tablet (40 mg total) by mouth daily.  . furosemide (LASIX) 40 MG tablet Take 1 tablet (40 mg total) by mouth daily.  Marland Kitchen gabapentin (NEURONTIN) 300 MG capsule Take 2 cap in the morning, 1 cap in the evening and 1 cap at night.  . hydrALAZINE (APRESOLINE) 25 MG tablet   . HYDROcodone-acetaminophen (NORCO/VICODIN) 5-325 MG tablet Take 1 tablet by mouth every 6 (six) hours as needed for moderate pain or severe pain.  . indapamide (LOZOL) 2.5 MG tablet Take 1 tablet (2.5 mg total) by mouth daily.  . Insulin Glargine (BASAGLAR KWIKPEN) 100 UNIT/ML SOPN INJECT 30 TO 36 UNITS UNDER THE SKIN EVERY DAY (Patient taking differently: Inject 30 Units into the skin daily. )  . ipratropium-albuterol (DUONEB) 0.5-2.5 (3) MG/3ML SOLN Take 3 mLs by nebulization every 4 (four) hours as needed (for shortness of breath).  . metolazone (ZAROXOLYN) 2.5 MG tablet   . Multiple Vitamin (MULTIVITAMIN WITH MINERALS) TABS tablet Take  1 tablet by mouth daily.  . pentoxifylline (TRENTAL) 400 MG CR tablet TAKE 1 TABLET BY MOUTH THREE TIMES DAILY FOR CLAUDICATION  . potassium chloride SA (K-DUR) 20 MEQ tablet Take 1 tablet (20 mEq total) by mouth 3 (three) times daily.  . prazosin (MINIPRESS) 2 MG capsule   . sennosides-docusate sodium (SENOKOT-S) 8.6-50 MG tablet Take 1-2 tablets by mouth 2 (two) times daily.  . sertraline (ZOLOFT) 100 MG tablet   . traZODone (DESYREL) 50 MG tablet Take 50 mg by mouth at bedtime.  Marland Kitchen VICTOZA 18 MG/3ML SOPN INJECT 1.8MG  INTO THE SKIN DAILY  . [DISCONTINUED] fentaNYL (DURAGESIC) 75  MCG/HR Place 1 patch onto the skin every 3 (three) days.  . [DISCONTINUED] furosemide (LASIX) 40 MG tablet Take 1 tablet (40 mg total) by mouth daily.  . [DISCONTINUED] gabapentin (NEURONTIN) 300 MG capsule Take 1 capsule po in AM and 1 capsule po in pm. Take 2 capsules po at bedtime. (Patient taking differently: Take 2 cap in the morning, 1 cap in the evening and 1 cap at night.)  . [DISCONTINUED] sertraline (ZOLOFT) 50 MG tablet 50 mg daily.  . fentaNYL (DURAGESIC) 100 MCG/HR Place 1 patch onto the skin every 3 (three) days.   No facility-administered encounter medications on file as of 11/08/2018.     Surgical History: Past Surgical History:  Procedure Laterality Date  . 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 CHOLECYSTECTOMY    . CORONARY ARTERY BYPASS GRAFT      Medical History: Past Medical History:  Diagnosis Date  . Anemia   . CAD (coronary artery disease)   . Chest pain   . Coronary artery disease   . Diabetes mellitus without complication ([place])   . Diastolic CHF ([place])   . Esophageal reflux   . Herpes zoster without mention of complication   . Hyperlipidemia   . Hypertension   . Insomnia   . Lumbago   . Lymphedema   . Neuropathy in diabetes ([place])   . Obstructive chronic bronchitis without exacerbation ([place])   . Other malaise and fatigue   . Stroke ([place])   . Varicose veins     Family History: Family History  Problem Relation Age of Onset  . Diabetes Mother   . Hyperlipidemia Mother   . Hypertension Mother   . Diabetes Father   . Hyperlipidemia Father   . Hypertension Father   . CAD Father     Social History   Socioeconomic History  . Marital status: Married    Spouse name: Not on file  . Number of children: Not on file  . Years of education: Not on file  . Highest education level: Not on file  Occupational History  . Not on file  Social  Needs  . Financial resource strain: Not on file  . Food insecurity:    Worry: Not on file    Inability: Not on file  . Transportation needs:    Medical: Not on file    Non-medical: Not on file  Tobacco Use  . Smoking status: Never Smoker  . Smokeless tobacco: Never Used  Substance and Sexual Activity  . Alcohol use: No    Comment: quit alcohol 30 years ago  . Drug use: No  . Sexual activity: Not on file  Lifestyle  . Physical activity:    Days per week: Not on file  Minutes per session: Not on file  . Stress: Not on file  Relationships  . Social connections:    Talks on phone: Not on file    Gets together: Not on file    Attends religious service: Not on file    Active member of club or organization: Not on file    Attends meetings of clubs or organizations: Not on file    Relationship status: Not on file  . Intimate partner violence:    Fear of current or ex partner: Not on file    Emotionally abused: Not on file    Physically abused: Not on file    Forced sexual activity: Not on file  Other Topics Concern  . Not on file  Social History Narrative   Lives at home with wife, uses a wheelchair now      Review of Systems  Constitutional: Positive for activity change and fatigue.  HENT: Negative for congestion and postnasal drip.   Respiratory: Positive for shortness of breath. Negative for cough and chest tightness.   Cardiovascular: Positive for leg swelling. Negative for chest pain and palpitations.  Gastrointestinal: Negative for constipation, diarrhea, nausea and vomiting.  Endocrine: Negative for cold intolerance, heat intolerance, polydipsia and polyuria.       Blood sugars doing well   Musculoskeletal: Positive for arthralgias, back pain and myalgias.  Skin: Positive for rash and wound.  Allergic/Immunologic: Positive for environmental allergies.  Neurological: Negative for dizziness, light-headedness and headaches.  Hematological: Negative for adenopathy.   Psychiatric/Behavioral: Positive for dysphoric mood. Negative for agitation. The patient is nervous/anxious.     Today's Vitals   11/08/18 1525  BP: 122/63  Pulse: 70  Resp: 16  SpO2: 95%  Weight: (!) 300 lb 6.4 oz (136.3 kg)  Height: 5\' 6"  (1.676 m)   Body mass index is 48.49 kg/m.  Physical Exam Vitals signs and nursing note reviewed.  Constitutional:      General: He is not in acute distress.    Appearance: He is well-developed. He is obese. He is not diaphoretic.  HENT:     Head: Normocephalic and atraumatic.     Nose: Nose normal.     Mouth/Throat:     Pharynx: No oropharyngeal exudate.  Eyes:     Conjunctiva/sclera: Conjunctivae normal.     Pupils: Pupils are equal, round, and reactive to light.  Neck:     Musculoskeletal: Normal range of motion and neck supple.     Thyroid: No thyromegaly.     Vascular: No JVD.     Trachea: No tracheal deviation.  Cardiovascular:     Rate and Rhythm: Normal rate and regular rhythm.     Heart sounds: Normal heart sounds. No murmur. No friction rub. No gallop.   Pulmonary:     Effort: Pulmonary effort is normal. No respiratory distress.     Breath sounds: Normal breath sounds. No rales.  Chest:     Chest wall: No tenderness.  Musculoskeletal: Normal range of motion.     Comments: Moderate to severe lower leg pain, bilaterally. Worse on the right side, especially after he "twisted it" this morning in the bathroom. Pain is severe from just above the heel and into the calf. Hurts to flex and dorsiflex the right foot. Strength is diminished. Having very difficult time putting any weight on the right foot.   Lymphadenopathy:     Cervical: No cervical adenopathy.  Skin:    General: Skin is warm and dry.  Comments: Bilateral lower extremities wrapped in clean dry, pressure dressings, applied per wound management.   Neurological:     Mental Status: He is alert and oriented to person, place, and time. Mental status is at baseline.      Cranial Nerves: No cranial nerve deficit.  Psychiatric:        Behavior: Behavior normal.        Thought Content: Thought content normal.        Judgment: Judgment normal.    Assessment/Plan: 1. Essential hypertension Stable. Continue bp medication as prescribed   2. Uncontrolled type 2 diabetes mellitus with hyperglycemia ([place]) Continue diabetic medication as prescribed   3. Edema of both lower legs due to peripheral venous insufficiency Continue furosemide as prescribed per cardiology. Order for hospital bed to bbe placed to help with edema, and venous insufficiency.  - furosemide (LASIX) 40 MG tablet; Take 1 tablet (40 mg total) by mouth daily.  Dispense: 30 tablet; Refill: 3  4. Diabetic polyneuropathy associated with type 2 diabetes mellitus (HCC) Continue gabapentin 300mg , two caps in the morning and one cap in the evening.  - gabapentin (NEURONTIN) 300 MG capsule; Take 2 cap in the morning, 1 cap in the evening and 1 cap at night.  Dispense: 180 capsule; Refill: 3  5. Chronic pain disorder Increase fentanyl patch to 165mcg/hour, replacing patch every 72 hours. Advised he use this with caution, as this may cause dizziness and drowsiness.  - fentaNYL (DURAGESIC) 100 MCG/HR; Place 1 patch onto the skin every 3 (three) days.  Dispense: 10 patch; Refill: 0  General Counseling: Nidal verbalizes understanding of the findings of todays visit and agrees with plan of treatment. I have discussed any further diagnostic evaluation that may be needed or ordered today. We also reviewed his medications today. he has been encouraged to call the office with any questions or concerns that should arise related to todays visit.  Reviewed risks and possible side effects associated with taking opiates, benzodiazepines and other CNS depressants. Combination of these could cause dizziness and drowsiness. Advised patient not to drive or operate machinery when taking these medications, as patient's and  other's life can be at risk and will have consequences. Patient verbalized understanding in this matter. Dependence and abuse for these drugs will be monitored closely. A Controlled substance policy and procedure is on file which allows [facility] to order a urine drug screen test at any visit. Patient understands and agrees with the plan  This patient was seen by [name] FNP Collaboration with Dr [name] as a part of collaborative care agreement  Meds ordered this encounter  Medications  . fentaNYL (DURAGESIC) 100 MCG/HR    Sig: Place 1 patch onto the skin every 3 (three) days.    Dispense:  10 patch    Refill:  0    Change from 75 to 157mcg patches    Order Specific Question:   Supervising Provider    Answer:   [name] [[id number]]  . furosemide (LASIX) 40 MG tablet    Sig: Take 1 tablet (40 mg total) by mouth daily.    Dispense:  30 tablet    Refill:  3    Order Specific Question:   Supervising Provider    Answer:   [name] [[id number]]  . gabapentin (NEURONTIN) 300 MG capsule    Sig: Take 2 cap in the morning, 1 cap in the evening and 1 cap at night.  Dispense:  180 capsule    Refill:  3    Increased dose. Patient will use up what he has first. Does not need new prescription yet.    Order Specific Question:   Supervising Provider    Answer:   [name] [[id number]]    Time spent: 30 Minutes      Dr [name] Internal medicine

## 2018-11-11 ENCOUNTER — Ambulatory Visit: Payer: Medicare Other | Admitting: Nurse Practitioner

## 2018-11-11 ENCOUNTER — Other Ambulatory Visit: Payer: Self-pay

## 2018-11-11 DIAGNOSIS — E11621 Type 2 diabetes mellitus with foot ulcer: Secondary | ICD-10-CM | POA: Diagnosis not present

## 2018-11-11 NOTE — Progress Notes (Addendum)
GUSTAV, KNUEPPEL ([id number]) Visit Report for 11/11/2018 Arrival Information Details Patient Name: Adam Merritt, Adam Merritt Date of Service: 11/11/2018 11:15 AM Medical Record Number: [id number] Patient Account Number: [id number] Date of Birth/Sex: [birth year] (73 y.o. M) Treating RN: Montey Hora Primary Care Laurence Crofford: [name] Other Clinician: Referring Lillyrose Reitan: [name] Treating Prarthana Parlin/Extender: Melburn Hake, [name]: 2 Visit Information History Since Last Visit Added or deleted any medications: No Patient Arrived: Walker Any new allergies or adverse reactions: No Arrival Time: 11:30 Had a fall or experienced change in No Accompanied By: self activities of daily living that may affect Transfer Assistance: None risk of falls: Patient Identification Verified: Yes Signs or symptoms of abuse/neglect since last visito No Secondary Verification Process Yes Hospitalized since last visit: No Completed: Implantable device outside of the clinic excluding No Patient Has Alerts: Yes cellular tissue based products placed in the center Patient Alerts: Patient on Blood since last visit: Thinner Has Dressing in Place as Prescribed: Yes Aspirin 325mg  Has Compression in Place as Prescribed: Yes Type II Diabetes ABI 5/19 L 1.1 R 0.9 Pain Present Now: No TBI L 0.6 R 0.6 Electronic Signature(s) Signed: 11/11/2018 12:46:19 PM By: Montey Hora Entered By: Montey Hora on 11/11/2018 12:46:18 [name] ([id number]) -------------------------------------------------------------------------------- Compression Therapy Details Patient Name: [name] Date of Service: 11/11/2018 11:15 AM Medical Record Number: [id number] Patient Account Number: [id number] Date of Birth/Sex: 1945-06-06 (73 y.o. M) Treating RN: Montey Hora Primary Care Armenta Erskin: [name] Other Clinician: Referring Abiel Antrim: [name] Treating Kartik Fernando/Extender: Melburn Hake, [name]:  30 Compression Therapy Performed for Wound Assessment: Wound #22 Left,Circumferential Lower Leg Performed By: Clinician Montey Hora, RN Compression Type: Four Layer Pre Treatment ABI: 1.1 Electronic Signature(s) Signed: 11/11/2018 12:48:25 PM By: Montey Hora Entered By: Montey Hora on 11/11/2018 12:48:24 [name] ([id number]) -------------------------------------------------------------------------------- Encounter Discharge Information Details Patient Name: [name] Date of Service: 11/11/2018 11:15 AM Medical Record Number: [id number] Patient Account Number: [id number] Date of Birth/Sex: 1946/04/15 (73 y.o. M) Treating RN: Montey Hora Primary Care Cassy Sprowl: [name] Other Clinician: Referring Ramal Eckhardt: [name] Treating Ashland Osmer/Extender: Melburn Hake, [name]: 30 Encounter Discharge Information Items Discharge Condition: Stable Ambulatory Status: Walker Discharge Destination: Home Transportation: Private Auto Accompanied By: self Schedule Follow-up Appointment: Yes Clinical Summary of Care: Electronic Signature(s) Signed: 11/11/2018 12:49:19 PM By: Montey Hora Entered By: Montey Hora on 11/11/2018 12:49:18 [name] ([id number]) -------------------------------------------------------------------------------- Wound Assessment Details Patient Name: [name] Date of Service: 11/11/2018 11:15 AM Medical Record Number: [id number] Patient Account Number: [id number] Date of Birth/Sex: 1945/07/24 (73 y.o. M) Treating RN: Montey Hora Primary Care Zetha Kuhar: [name] Other Clinician: Referring Sora Vrooman: [name] Treating Kaytlyn Din/Extender: STONE III, [name]: 30 Wound Status Wound Number: 20 Primary Diabetic Wound/Ulcer of the Lower Etiology: Extremity Wound Location: Right, Posterior Lower Leg Wound Status: Healed - Epithelialized Wounding Event: Gradually Appeared Date Acquired: 10/02/2018 Weeks Of  Treatment: 5 Clustered Wound: No Wound Measurements Length: (cm) 0 Width: (cm) 0 Depth: (cm) 0 Area: (cm) 0 Volume: (cm) 0 % Reduction in Area: 100% % Reduction in Volume: 100% Wound Description Classification: Grade 2 Electronic Signature(s) Signed: 11/11/2018 4:19:49 PM By: Montey Hora Entered By: Montey Hora on 11/11/2018 12:46:51 [name] ([id number]) -------------------------------------------------------------------------------- Wound Assessment Details Patient Name: [name] Date of Service: 11/11/2018 11:15 AM Medical Record Number: [id number] Patient Account Number: [id number] Date of Birth/Sex: June 03, 1946 (73 y.o. M) Treating RN: Montey Hora Primary Care Samuella Rasool:  [name] Other Clinician: Referring Kemiya Batdorf: [name] Treating Jenevie Casstevens/Extender: STONE III, [name]: 30 Wound Status Wound Number: 21 Primary Etiology: Lymphedema Wound Location: Right, Anterior Lower Leg Wound Status: Healed - Epithelialized Wounding Event: Gradually Appeared Date Acquired: 10/25/2018 Weeks Of Treatment: 1 Clustered Wound: No Wound Measurements Length: (cm) 0 Width: (cm) 0 Depth: (cm) 0 Area: (cm) 0 Volume: (cm) 0 % Reduction in Area: 100% % Reduction in Volume: 100% Wound Description Classification: Partial Thickness Electronic Signature(s) Signed: 11/11/2018 4:19:49 PM By: Montey Hora Entered By: Montey Hora on 11/11/2018 12:46:51 [name] ([id number]) -------------------------------------------------------------------------------- Wound Assessment Details Patient Name: [name] Date of Service: 11/11/2018 11:15 AM Medical Record Number: [id number] Patient Account Number: [id number] Date of Birth/Sex: [birth year] (73 y.o. M) Treating RN: Montey Hora Primary Care Attikus Bartoszek: [name] Other Clinician: Referring Jodette Wik: [name] Treating Aeon Kessner/Extender: Melburn Hake, [name]: 30 Wound  Status Wound Number: 22 Primary Lymphedema Etiology: Wound Location: Left Lower Leg - Circumferential Wound Open Wounding Event: Gradually Appeared Status: Date Acquired: 10/25/2018 Comorbid Anemia, Lymphedema, Chronic Obstructive Weeks Of Treatment: 1 History: Pulmonary Disease (COPD), Sleep Apnea, Clustered Wound: No Congestive Heart Failure, Coronary Artery Disease, Hypertension, Peripheral Venous Disease, Type II Diabetes, Osteoarthritis, Neuropathy, Received Chemotherapy Wound Measurements Length: (cm) 4.3 Width: (cm) 40 Depth: (cm) 0.1 Area: (cm) 135.088 Volume: (cm) 13.509 % Reduction in Area: 6.8% % Reduction in Volume: 6.8% Epithelialization: Medium (34-66%) Tunneling: No Undermining: No Wound Description Classification: Partial Thickness Wound Margin: Flat and Intact Exudate Amount: Large Exudate Type: Serous Exudate Color: amber Foul Odor After Cleansing: No Slough/Fibrino No Wound Bed Granulation Amount: Large (67-100%) Exposed Structure Granulation Quality: Pink Fascia Exposed: No Necrotic Amount: None Present (0%) Fat Layer (Subcutaneous Tissue) Exposed: No Tendon Exposed: No Muscle Exposed: No Joint Exposed: No Bone Exposed: No Limited to Skin Breakdown Treatment Notes Wound #22 (Left, Circumferential Lower Leg) Notes xtrasorb, 4 layer wrap Electronic Signature(s) Signed: 11/11/2018 12:47:09 PM By: Montey Hora Entered By: Montey Hora on 11/11/2018 12:47:08

## 2018-11-14 ENCOUNTER — Encounter: Payer: Medicare Other | Admitting: Physician Assistant

## 2018-11-14 ENCOUNTER — Other Ambulatory Visit: Payer: Self-pay

## 2018-11-14 DIAGNOSIS — E11621 Type 2 diabetes mellitus with foot ulcer: Secondary | ICD-10-CM | POA: Diagnosis not present

## 2018-11-18 ENCOUNTER — Other Ambulatory Visit: Payer: Self-pay

## 2018-11-18 DIAGNOSIS — E11621 Type 2 diabetes mellitus with foot ulcer: Secondary | ICD-10-CM | POA: Diagnosis not present

## 2018-11-18 NOTE — Progress Notes (Signed)
REDMOND, WHITTLEY ([id number]) Visit Report for 11/18/2018 Arrival Information Details Patient Name: Adam Merritt, Adam Merritt Date of Service: 11/18/2018 12:45 PM Medical Record Number: [id number] Patient Account Number: [id number] Date of Birth/Sex: 06-23-1945 (73 y.o. M) Treating RN: Cornell Barman Primary Care Maryetta Shafer: [name] Other Clinician: Referring Kyl Givler: [name] Treating Mollye Guinta/Extender: Melburn Hake, [name]: 44 Visit Information History Since Last Visit Added or deleted any medications: No Patient Arrived: Walker Any new allergies or adverse reactions: No Arrival Time: 12:42 Had a fall or experienced change in No Accompanied By: self activities of daily living that may affect Transfer Assistance: None risk of falls: Patient Identification Verified: Yes Signs or symptoms of abuse/neglect since last visito No Secondary Verification Process Yes Hospitalized since last visit: No Completed: Implantable device outside of the clinic excluding No Patient Has Alerts: Yes cellular tissue based products placed in the center Patient Alerts: Patient on Blood since last visit: Thinner Has Compression in Place as Prescribed: Yes Aspirin 325mg  Pain Present Now: No Type II Diabetes ABI 5/19 L 1.1 R 0.9 TBI L 0.6 R 0.6 Electronic Signature(s) Signed: 11/18/2018 4:30:53 PM By: Gretta Cool, BSN, RN, CWS, Kim RN, BSN Entered By: Gretta Cool, BSN, RN, CWS, Kim on 11/18/2018 12:42:59 Adam Merritt ([id number]) -------------------------------------------------------------------------------- Encounter Discharge Information Details Patient Name: Adam Merritt Date of Service: 11/18/2018 12:45 PM Medical Record Number: [id number] Patient Account Number: [id number] Date of Birth/Sex: 08-25-1945 (73 y.o. M) Treating RN: Cornell Barman Primary Care Jordin Dambrosio: [name] Other Clinician: Referring Aquita Simmering: [name] Treating Kvon Mcilhenny/Extender: Melburn Hake, [name]:  89 Encounter Discharge Information Items Discharge Condition: Stable Ambulatory Status: Walker Discharge Destination: Home Transportation: Private Auto Accompanied By: self Schedule Follow-up Appointment: Yes Clinical Summary of Care: Electronic Signature(s) Signed: 11/18/2018 4:30:53 PM By: Gretta Cool, BSN, RN, CWS, Kim RN, BSN Entered By: Gretta Cool, BSN, RN, CWS, Kim on 11/18/2018 12:55:37 Adam Merritt ([id number]) -------------------------------------------------------------------------------- Wound Assessment Details Patient Name: Adam Merritt Date of Service: 11/18/2018 12:45 PM Medical Record Number: [id number] Patient Account Number: [id number] Date of Birth/Sex: 17-Mar-1946 (73 y.o. M) Treating RN: Cornell Barman Primary Care Khushbu Pippen: [name] Other Clinician: Referring Sahory Nordling: [name] Treating Areli Jowett/Extender: Melburn Hake, [name]: 31 Wound Status Wound Number: 22 Primary Etiology: Lymphedema Wound Location: Left, Circumferential Lower Leg Wound Status: Open Wounding Event: Gradually Appeared Date Acquired: 10/25/2018 Weeks Of Treatment: 2 Clustered Wound: No Wound Measurements Length: (cm) 0.8 Width: (cm) 20 Depth: (cm) 0.1 Area: (cm) 12.566 Volume: (cm) 1.257 % Reduction in Area: 91.3% % Reduction in Volume: 91.3% Wound Description Classification: Partial Thickness Treatment Notes Wound #22 (Left, Circumferential Lower Leg) Notes non adherent pad, 4 layer wrap Electronic Signature(s) Signed: 11/18/2018 4:30:53 PM By: Gretta Cool, BSN, RN, CWS, Kim RN, BSN Entered By: Gretta Cool, BSN, RN, CWS, Kim on 11/18/2018 12:54:23

## 2018-11-18 NOTE — Progress Notes (Signed)
KODAH, MARET ([id number]) Visit Report for 11/14/2018 Chief Complaint Document Details Patient Name: Adam Merritt, Adam Merritt Date of Service: 11/14/2018 1:45 PM Medical Record Number: [id number] Patient Account Number: [id number] Date of Birth/Sex: 17-Sep-1945 (73 y.o. M) Treating RN: Montey Hora Primary Care Provider: [name] Other Clinician: Referring Provider: [name] Treating Provider/Extender: Melburn Hake, [name]: 31 Information Obtained from: Patient Chief Complaint he is here for evaluation of bilateral lower extremity lymphedema right right toe and left LE ulcers Electronic Signature(s) Signed: 11/18/2018 12:02:24 PM By: Worthy Keeler PA-C Entered By: Worthy Keeler on 11/14/2018 13:35:40 Adam Merritt ([id number]) -------------------------------------------------------------------------------- HPI Details Patient Name: Adam Merritt Date of Service: 11/14/2018 1:45 PM Medical Record Number: [id number] Patient Account Number: [id number] Date of Birth/Sex: 1946/04/25 (73 y.o. M) Treating RN: Montey Hora Primary Care Provider: [name] Other Clinician: Referring Provider: [name] Treating Provider/Extender: Melburn Hake, [name]: 31 History of Present Illness HPI Description: 10/18/17-He is here for initial evaluation of bilateral lower extremity ulcerations in the presence of venous insufficiency and lymphedema. He has been seen by vascular medicine in the past, Dr. Lucky Cowboy, last seen in 2016. He does have a history of abnormal ABIs, which is to be expected given his lymphedema and venous insufficiency. According to Epic, it appears that all attempts for arterial evaluation and/or angiography were not follow through with by patient. He does have a history of being seen in lymphedema clinic in 2018, stopped going approximately 6 months ago stating "it didn't do any good". He does not have lymphedema pumps, he does not have custom fit  compression wrap/stockings. He is diabetic and his recent A1c last month was 7.6. He admits to chronic bilateral lower extremity pain, no change in pain since blister and ulceration development. He is currently being treated with Levaquin for bronchitis. He has home health and we will continue. 10/25/17-He is here in follow-up evaluation for bilateral lower extremity ulcerationssubtle he remains on Levaquin for bronchitis. Right lower extremity with no evidence of drainage or ulceration, persistent left lower extremity ulceration. He states that home health has not been out since his appointment. He went to [facility] on Tuesday, studies revealed: RIGHT ABI 0.9, TBI 0.6 LEFT ABI 1.1, TBI 0.6 with triphasic flow bilaterally. We will continue his same treatment plan. He has been educated on compression therapy and need for elevation. He will benefit from lymphedema pumps 11/01/17-He is here in follow-up evaluation for left lower extremity ulcer. The right lower extremity remains healed. He has home health services, but they have not been out to see the patient for 2-3 weeks. He states it home health physical therapy changed his dressing yesterday after therapy; he placed Ace wrap compression. We are still waiting for lymphedema pumps, reordered d/t need for company change. 11/08/17-He is here in follow-up evaluation for left lower extremity ulcer. It is improved. Edema is significantly improved with compression therapy. We will continue with same treatment plan and he will follow-up next week. No word regarding lymphedema pumps 11/15/17-He is here in follow-up evaluation for left lower extremity ulcer. He is healed and will be discharged from wound care services. I have reached out to [facility] regarding his lymphedema pumps. They have been unable to reach the patient; the contact number they had with the patient's wife's cell phone and she has not answered any unrecognized  calls. Contact should be made today, trial planned for next week; [facility] will continue to  follow 11/27/17 on evaluation today patient has multiple blistered areas over the right lower extremity his left lower extremity appears to be doing okay. These blistered areas show signs of no infection which is great news. With that being said he did have some necrotic skin overlying which was mechanically debrided away with saline and gauze today without complication. Overall post debridement the wounds appear to be doing better but in general his swelling seems to be increased. This is obviously not good news. I think this is what has given rise to the blisters. 12/04/17 on evaluation today patient presents for follow-up concerning his bilateral lower extremity edema in the right lower extremity ulcers. He has been tolerating the dressing changes without complication. With that being said he has had no real issues with the wraps which is also good news. Overall I'm pleased with the progress he's been making. 12/11/17 on evaluation today patient appears to be doing rather well in regard to his right lateral lower extremity ulcer. He's been tolerating the dressing changes without complication. Fortunately there does not appear to be any evidence of infection at this time. Overall I'm pleased with the progress that is being made. Unfortunately he has been in the hospital due to having what sounds to be a stomach virus/flu fortunately that is starting to get better. 12/18/17 on evaluation today patient actually appears to be doing very well in regard to his bilateral lower extremities the swelling is under fairly good control his lymphedema pumps are still not up and running quite yet. With that being said he does have several areas of opening noted as far as wounds are concerned mainly over the left lower extremity. With that being said I do believe once he gets lymphedema pumps this would least help you  mention some the fluid and preventing this from occurring. Hopefully that will be set up soon sleeves are Artie in place at his home he just waiting for the machine. 12/25/17 on evaluation today patient actually appears to be doing excellent in fact all of his ulcers appear to have resolved his VLADISLAV, Adam Merritt. ([id number]) legs appear very well. I do think he needs compression stockings we have discussed this and they are actually going to go to [place] today to elastic therapy to get this fitted for him. I think that is definitely a good thing to do. Readmission: 04/09/18 upon evaluation today this patient is seen for readmission due to bilateral lower extremity lymphedema. He has significant swelling of his extremities especially on the left although the right is also swollen he has weeping from both sides. There are no obvious open wounds at this point. Fortunately he has been doing fairly well for quite a bit of time since I last saw him. Nonetheless unfortunately this seems to have reopened and is giving quite a bit of trouble. He states this began about a week ago when he first called [place] to get in to be seen. No fevers, chills, nausea, or vomiting noted at this time. He has not been using his lymphedema pumps due to the fact that they won't fit on his leg at this point likewise is also not been using his compression for essentially the same reason. 04/16/18 upon evaluation today patient actually appears to be doing a little better in regard to the fluid in his bilateral lower extremities. With that being said he's had three falls since I saw him last week. He also states that he's been feeling very poorly. I was  concerned last week and feel like that the concern is still there as far as the congestion in his chest is concerned he seems to be breathing about the same as last week but again he states he's very weak he's not even able to walk further than from the chair to the door. His wife had  to buy a wheelchair just to be able to get them out of the house to get to the appointment today. This has me very concerned. 04/23/18 on evaluation today patient actually appears to be doing much better than last week's evaluation. At that time actually had to transport him to the ER via EMS and he subsequently was admitted for acute pulmonary edema, acute renal failure, and acute congestive heart failure. Fortunately he is doing much better. Apparently they did dialyze him and were able to take off roughly 35 pounds of fluid. Nonetheless he is feeling much better both in regard to his breathing and he's able to get around much better at this time compared to previous. Overall I'm very happy with how things are at this time. There does not appear to be any evidence of infection currently. No fevers, chills, nausea, or vomiting noted at this time. 04/30/2018 patient seen today for follow-up and management of bilateral lower extremity lymphedema. He did express being more sad today than usual due to the recent loss of his dog. He states that he has been compliant with using the lymphedema pumps. However he does admit a minute over the last 2-3 days he has not been using the pumps due to the recent loss of his dog. At this time there is no drainage or open wounds to his lower extremities. The left leg edema is measuring smaller today. Still has a significant amount of edema on bilateral lower extremities With dry flaky skin. He will be referred to the lymphedema clinic for further management. Will continue 3 layer compression wraps and follow-up in 1-2 weeks.Denies any pain, fever, chairs recently. No recent falls or injuries reported during this visit. 05/07/18 on evaluation today patient actually appears to be doing very well in regard to his lower extremities in general all things considered. With that being said he is having some pain in the legs just due to the amount of swelling. He does have an  area where he had a blister on the left lateral lower extremity this is open at this point other than that there's nothing else weeping at this time. 05/14/18 on evaluation today patient actually appears to be doing excellent all things considered in regard to his lower extremities. He still has a couple areas of weeping on each leg which has continued to be the issue for him. He does have an appointment with the lymphedema clinic although this isn't until February 2020. That was the earliest they had. In the meantime he has continued to tolerate the compression wraps without complication. 05/28/18 on evaluation today patient actually appears to be doing more poorly in regard to his left lower extremity where he has a wound open at this point. He also had a fall where he subsequently injured his right great toe which has led to an open wound at the site unfortunately. He has been tolerating the dressing changes without complication in general as far as the wraps are concerned that he has not been putting any dressing on the left 1st toe ulcer site. 06/11/18 on evaluation today patient appears to be doing much worse in regard to his  bilateral lower extremity ulcers. He has been tolerating the dressing changes without complication although his legs have not been wrapped more recently. Overall I am not very pleased with the way his legs appear. I do believe he needs to be back in compression wraps he still has not received his compression wraps from the [facility] hospital as of yet. 06/18/18 on evaluation today patient actually appears to be doing significantly better than last time I saw him. He has been tolerating the compression wraps without complication in the circumferential ulcers especially appear to be doing much better. His toe ulcer on the right in regard to the great toe is better although not as good as the legs in my pinion. No fevers chills noted Adam Merritt, PIANKA ([id number]) 07/02/18 on evaluation  today patient appears to be doing much better in regard to his lower extremity ulcers. Unfortunately since I last saw him he's had the distal portion of his right great toe if he dated it sounds as if this actually went downhill very quickly. I had only seen him a few days prior and the toe did not appear to be infected at that point subsequently became infected very rapidly and it was decided by the surgeon that the distal portion of the toe needed to be removed. The patient seems to be doing well in this regard he tells me. With that being said his lower extremities are doing better from the standpoint of the wounds although he is significantly swollen at this point. 07/09/18 on evaluation today patient appears to be doing better in regard to the wounds on his lower extremities. In fact everything is almost completely healed he is just a small area on the left posterior lower extremity that is open at this point. 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 With that being said he needs some kind of compression it does sound like he may not be wearing his compression, that is the wraps, during the entire time between when he's here visit to visit. Apparently his wife took the current one off because it began to "fall apart". 07/16/18 on evaluation today patient appears to be extremely swollen especially in regard to his left lower extremity unfortunately. He also has a new skin tear over the left lower extremity and there's a smaller area on the right lower extremity as well. Unfortunately this seems to be due in part to blistering and fluid buildup in his leg. He did get the reduction wraps that were ordered by the [facility] hospital for him to go to lymphedema clinic. With that being said his wounds on the legs have not healed  to the point to where they would likely accept them as a patient lymphedema clinic currently. We need to try to get this to heal. With that being said he's been taking his wraps off which is not doing him any favors at this point. In fact this is probably quite counterproductive compared to what needs to occur. We will likely need to increase to a four layer compression wrap and continue to also utilize elevation and he has to keep the wraps on not take them off as he's been doing currently hasn't had a wrap on since Saturday. 07/23/18 on evaluation today patient appears to be doing much better in regard to his bilateral lower extremities.  In fact his left lower extremity which was the largest is actually 15 cm smaller today compared to what it was last time he was here in our clinic. This is obviously good news after just one week. Nonetheless the differences he actually kept the wraps on during the entire week this time. That's not typical for him. I do believe he understands a little bit better now the severity of the situation and why it's important for him to keep these wraps on. 07/30/18 on evaluation today patient actually appears to be doing rather well in regard to his lower extremities. His legs are much smaller than they have been in the past and he actually has only one very small rudder superficial region remaining that is not closed on the left lateral/posterior lower extremity even this is almost completely close. I do believe likely next week he will be healed without any complications. I do think we need to continue the wraps however this seems to be beneficial for him. I also think it may be a good time for [place] to go ahead and see about getting the appointment with the lymphedema clinic which is supposed to be made for him in order to keep this moving along and hopefully get them into compression wraps that will in the end help him to remain healed. 08/06/18 on evaluation today patient  actually appears to be doing very well in regard as bilateral lower extremities. In fact his wounds appear to be completely healed at this time. 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 He is in the process of getting appointment with the lymphedema clinic we have made this referral were just waiting to hear back on the schedule time. We need to follow up on that today as well. 08/13/18 on evaluation today patient actually appears to be doing very well in regard to his bilateral lower extremities there are no open wounds at this point. We are gonna go ahead and see about ordering the Velcro compression wraps for him had a discussion with them about [place] doing it versus the [place] they feel like they can definitely afford going ahead and get the wraps themselves and they would prefer to try to avoid having to go to the lymphedema clinic if it all possible which I completely understand. As long as he has good compression I'm okay either way. 3/17//20 on evaluation today patient actually appears to be doing well in regard to his bilateral lower extremity ulcers. He has been tolerating the dressing changes without complication specifically the compression wraps. Overall is had no issues my fingers finding that I see at this point is that he is having trouble with constipation. He tells me he has not been able to go to the bathroom for about six days. He's taken to over-the-counter oral laxatives unfortunately this is not helping. He has not contacted his doctor. 08/27/18 on evaluation today patient appears to be doing fairly well in regard to his lower extremities at this point. There does not appear to be any new altars is swelling is very well controlled. We are still waiting for his Velcro compression wraps to arrive that should be sometime in the next week is Artie sent the check for these. 09/03/18 on evaluation today patient appears to be doing  excellent in regard to his bilateral lower extremities which he shows AYDIAN, DIMMICK. ([id number]) no signs of wound openings in regard to this point. He does have  his Velcro compression wraps which did arrive in the mail since I saw him last week. Overall he is doing excellent in my pinion. 09/13/18 on evaluation today patient appears to be doing very well currently in regard to the overall appearance of his bilateral lower extremities although he's a little bit more swollen than last time we saw him. At that point he been discharged without any open wounds. Nonetheless he has a small open wound on the posterior left lower extremity with some evidence of cellulitis noted as well. Fortunately I feel like he has made good progress overall with regard to his lower extremities from were things used to be. 09/20/18 on evaluation today patient actually appears to be doing much better. The erythematous lower extremity is improving wound itself which is still open appears to be doing much better as far as but appearance as well as pain is concerned overall very pleased in this regard. There's no signs of active infection at this time. 09/27/18 on evaluation today patient's wounds on the lower extremity actually appear to be doing fairly well at this time which is good news. There is no evidence of active infection currently and again is just as left lower extremity were there any wounds at this point anyway. I believe they may be completely healed but again I'm not 100% sure based on evaluation today. I think one more week of observation would likely be a good idea. 10/04/18 on evaluation today patient actually appears to be doing excellent in regard to the left lower extremity which actually appears to be completely healed as of today. Unfortunately he's been having issues with his right lower extremity have a new wound that has opened. Fortunately there's no evidence of active infection at this time which is  good news. 10/10/18 upon evaluation today patient actually appears to be doing a little bit worse with the new open area on his right posterior lower extremity. He's been tolerating the dressing changes without complication. Right now we been using his compression wraps although I think we may need to switch back to actually performing bilateral compression wraps are in the clinic. No fevers, chills, nausea, or vomiting noted at this time. 10/17/18 on evaluation today patient actually appears to be doing quite well in regard to his bilateral lower extremity ulcers. He has been tolerating the reps without complication although he would prefer not to rewrap his legs as of today. Fortunately there's no signs of active infection which is good news. No fevers, chills, nausea, or vomiting noted at this time. 10/24/18 on evaluation today patient appears to be doing very well in regard to his lower extremities. His right lower extremity is shown signs of healing and his left lower [place] though not healed appears to be improving which is excellent news. Overall very pleased with how things seem to be progressing at this time. The patient is likewise happy to hear this. His Velcro compression wraps however have not been put on properly will gonna show his wife how to do that properly today. 10/31/18 on evaluation today patient appears to be doing more poorly in regard to his left lower extremity in particular although both lower extremities actually are showing some signs of being worse than my previous evaluation. Unfortunately I'm just not sure that his compression stockings even with the use of the compression/lymphedema pumps seem to be controlling this well. Upon further questioning he tells me that he also is not able to lie flat in the bed  due to his congestive heart failure and difficulty breathing. For that reason he sleeps in his recliner. To make matters worse his recliner also cannot even hold  his legs up so instead of even being somewhat elevated they pretty much hang down to the floor. This is the way he sleeps each night which is definitely counterproductive to everything else that were attempting to do from the standpoint of controlling his fluid. Nonetheless I think that he potentially could benefit from a hospital bed although this would be something that his primary care provider would likely have to order since anything that is order on our side has to be directly related to wound care and again the hospital bed is not necessarily a direct relation to although I think it does contribute to his overall wound status on his lower extremities. 11/07/18 on evaluation today patient appears to be doing better in regard to his bilateral lower extremity. He's been tolerating the dressing changes without complication. We did in the interim since I last saw him switch to just using extras orbiting new alginate and that seems to have done much better for him. I'm very pleased with the overall progress that is made. 11/14/18 on evaluation today patient appears to be redoing rather well in regard to his left lower extremity ulcers which are the only ones remaining at this point. Fortunately there's no signs of active infection at this time which is good news. Overall been very pleased with how things seem to be progressing currently. No fevers, chills, nausea, or vomiting noted at this time. Electronic Signature(s) Signed: 11/18/2018 12:02:24 PM By: Melynda Ripple ([id number]) Entered By: Worthy Keeler on 11/14/2018 16:05:30 Adam Merritt ([id number]) -------------------------------------------------------------------------------- Physical Exam Details Patient Name: Adam Merritt Date of Service: 11/14/2018 1:45 PM Medical Record Number: [id number] Patient Account Number: [id number] Date of Birth/Sex: 03-Jul-1945 (73 y.o. M) Treating RN: Montey Hora Primary Care  Provider: [name] Other Clinician: Referring Provider: [name] Treating Provider/Extender: STONE III, [name]: 13 Constitutional Well-nourished and well-hydrated in no acute distress. Respiratory normal breathing without difficulty. clear to auscultation bilaterally. Cardiovascular regular rate and rhythm with normal S1, S2. Psychiatric this patient is able to make decisions and demonstrates good insight into disease process. Alert and Oriented x 3. pleasant and cooperative. Notes Upon inspection patient's wound gets appear to be doing quite well there's no signs of active infection which is good news. Overall I think he is very close to complete resolution I do believe we can use his poker compression wraps for the right lower extremity although I would recommend continuing to wrap the left lower extremity is in agreement with this plan. Electronic Signature(s) Signed: 11/18/2018 12:02:24 PM By: Worthy Keeler PA-C Entered By: Worthy Keeler on 11/14/2018 16:06:22 Adam Merritt ([id number]) -------------------------------------------------------------------------------- Physician Orders Details Patient Name: Adam Merritt Date of Service: 11/14/2018 1:45 PM Medical Record Number: [id number] Patient Account Number: [id number] Date of Birth/Sex: 01-03-1946 (73 y.o. 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 Skin Barriers/Peri-Wound Care Wound #22 Left,Circumferential Lower Leg o Barrier cream Primary Wound Dressing Wound #22 Left,Circumferential Lower Leg o Non-adherent pad Dressing Change Frequency Wound #22 Left,Circumferential Lower Leg o Dressing is to be changed Monday and Thursday. Follow-up Appointments Wound #22 Left,Circumferential Lower Leg o Return Appointment in 1 week. - Monday and Thursday o Nurse Visit as needed Edema Control Wound #22 Left,Circumferential Lower Leg o 4-Layer Compression System - Left Lower Extremity. o Elevate legs to the level of the heart and pump ankles as often as possible Additional Orders / Instructions Wound #22 Left,Circumferential Lower Leg o Other: - Talk with PCP about getting a hospital bed to sleep in. BRAELIN, COSTLOW ([id number]) Electronic Signature(s) Signed: 11/14/2018 3:09:24 PM By: Montey Hora Signed: 11/18/2018 12:02:24 PM By: Worthy Keeler PA-C Entered By: Montey Hora on 11/14/2018 14:22:16 Adam Merritt ([id number]) -------------------------------------------------------------------------------- Problem List Details Patient Name: Adam Merritt Date of Service: 11/14/2018 1:45 PM Medical Record Number: [id number] Patient Account Number: [id number] Date of Birth/Sex: Oct 07, 1945 (73 y.o. M) Treating RN: Montey Hora Primary Care Provider: [name] Other Clinician: Referring Provider: [name] Treating Provider/Extender: Melburn Hake, [name]: 47 Active Problems ICD-10 Evaluated Encounter Code Description Active Date Today Diagnosis I89.0 Lymphedema, not elsewhere classified 04/09/2018 No Yes L97.512 Non-pressure chronic ulcer of other part of right foot with fat 05/28/2018 No Yes layer exposed L97.822 Non-pressure  chronic ulcer of other part of left lower leg with 05/28/2018 No Yes fat layer exposed L98.492 Non-pressure chronic ulcer of skin of other sites with fat layer 06/11/2018 No Yes exposed E66.9 Obesity, unspecified 04/09/2018 No Yes Inactive Problems Resolved Problems ICD-10 Code Description Active Date Resolved Date L03.115 Cellulitis of right lower limb 04/09/2018 04/09/2018 L03.116 Cellulitis of left lower limb 04/09/2018 04/09/2018 Electronic Signature(s) Signed: 11/18/2018 12:02:24 PM By: Worthy Keeler PA-C Entered By: Worthy Keeler on 11/14/2018 13:35:34 Adam Merritt ([id number]) JAYMASON, LEDESMA ([id number]) -------------------------------------------------------------------------------- Progress Note Details Patient Name: Adam Merritt Date of Service: 11/14/2018 1:45 PM Medical Record Number: [id number] Patient Account Number: [id number] Date of Birth/Sex: 10/04/1945 (73 y.o. M) Treating RN: Montey Hora Primary Care Provider: [name] Other Clinician: Referring Provider: [name] Treating Provider/Extender: Melburn Hake, [name]: 31 Subjective Chief Complaint Information obtained from Patient he is here for evaluation of bilateral lower extremity lymphedema right right toe and left LE ulcers History of Present Illness (HPI) 10/18/17-He is here for initial evaluation of bilateral lower extremity ulcerations in the presence of venous insufficiency and lymphedema. He has been seen by vascular medicine in the past, Dr. Lucky Cowboy, last seen in 2016. He does have a history of abnormal ABIs, which is to be expected given his lymphedema and venous insufficiency. According to Epic, it appears that all attempts for arterial evaluation and/or angiography were not follow through with by patient. He does have a history of being seen in lymphedema clinic in 2018, stopped going approximately 6 months ago stating "it didn't do any good". He does not have lymphedema pumps, he  does not have custom fit compression wrap/stockings. He is diabetic and his recent A1c last month was 7.6. He admits to chronic bilateral lower extremity pain, no change in pain since blister and ulceration development. He is currently being treated with Levaquin for bronchitis. He has  home health and we will continue. 10/25/17-He is here in follow-up evaluation for bilateral lower extremity ulcerationssubtle he remains on Levaquin for bronchitis. Right lower extremity with no evidence of drainage or ulceration, persistent left lower extremity ulceration. He states that home health has not been out since his appointment. He went to [facility] on Tuesday, studies revealed: RIGHT ABI 0.9, TBI 0.6 LEFT ABI 1.1, TBI 0.6 with triphasic flow bilaterally. We will continue his same treatment plan. He has been educated on compression therapy and need for elevation. He will benefit from lymphedema pumps 11/01/17-He is here in follow-up evaluation for left lower extremity ulcer. The right lower extremity remains healed. He has home health services, but they have not been out to see the patient for 2-3 weeks. He states it home health physical therapy changed his dressing yesterday after therapy; he placed Ace wrap compression. We are still waiting for lymphedema pumps, reordered d/t need for company change. 11/08/17-He is here in follow-up evaluation for left lower extremity ulcer. It is improved. Edema is significantly improved with compression therapy. We will continue with same treatment plan and he will follow-up next week. No word regarding lymphedema pumps 11/15/17-He is here in follow-up evaluation for left lower extremity ulcer. He is healed and will be discharged from wound care services. I have reached out to [facility] regarding his lymphedema pumps. They have been unable to reach the patient; the contact number they had with the patient's wife's cell phone and she has not answered  any unrecognized calls. Contact should be made today, trial planned for next week; [facility] will continue to follow 11/27/17 on evaluation today patient has multiple blistered areas over the right lower extremity his left lower extremity appears to be doing okay. These blistered areas show signs of no infection which is great news. With that being said he did have some necrotic skin overlying which was mechanically debrided away with saline and gauze today without complication. Overall post debridement the wounds appear to be doing better but in general his swelling seems to be increased. This is obviously not good news. I think this is what has given rise to the blisters. 12/04/17 on evaluation today patient presents for follow-up concerning his bilateral lower extremity edema in the right lower extremity ulcers. He has been tolerating the dressing changes without complication. With that being said he has had no real issues with the wraps which is also good news. Overall I'm pleased with the progress he's been making. 12/11/17 on evaluation today patient appears to be doing rather well in regard to his right lateral lower extremity ulcer. He's been tolerating the dressing changes without complication. Fortunately there does not appear to be any evidence of infection at this time. Overall I'm pleased with the progress that is being made. Unfortunately he has been in the hospital due to having what sounds to be a stomach virus/flu fortunately that is starting to get better. 12/18/17 on evaluation today patient actually appears to be doing very well in regard to his bilateral lower extremities the DARROLD, BEZEK. ([id number]) swelling is under fairly good control his lymphedema pumps are still not up and running quite yet. With that being said he does have several areas of opening noted as far as wounds are concerned mainly over the left lower extremity. With that being said I do believe once he  gets lymphedema pumps this would least help you mention some the fluid and preventing this from occurring. Hopefully  that will be set up soon sleeves are Artie in place at his home he just waiting for the machine. 12/25/17 on evaluation today patient actually appears to be doing excellent in fact all of his ulcers appear to have resolved his legs appear very well. I do think he needs compression stockings we have discussed this and they are actually going to go to  today to elastic therapy to get this fitted for him. I think that is definitely a good thing to do. Readmission: 04/09/18 upon evaluation today this patient is seen for readmission due to bilateral lower extremity lymphedema. He has significant swelling of his extremities especially on the left although the right is also swollen he has weeping from both sides. There are no obvious open wounds at this point. Fortunately he has been doing fairly well for quite a bit of time since I last saw him. Nonetheless unfortunately this seems to have reopened and is giving quite a bit of trouble. He states this began about a week ago when he first called [place] to get in to be seen. No fevers, chills, nausea, or vomiting noted at this time. He has not been using his lymphedema pumps due to the fact that they won't fit on his leg at this point likewise is also not been using his compression for essentially the same reason. 04/16/18 upon evaluation today patient actually appears to be doing a little better in regard to the fluid in his bilateral lower extremities. With that being said he's had three falls since I saw him last week. He also states that he's been feeling very poorly. I was concerned last week and feel like that the concern is still there as far as the congestion in his chest is concerned he seems to be breathing about the same as last week but again he states he's very weak he's not even able to walk further than from the chair to the  door. His wife had to buy a wheelchair just to be able to get them out of the house to get to the appointment today. This has me very concerned. 04/23/18 on evaluation today patient actually appears to be doing much better than last week's evaluation. At that time actually had to transport him to the ER via EMS and he subsequently was admitted for acute pulmonary edema, acute renal failure, and acute congestive heart failure. Fortunately he is doing much better. Apparently they did dialyze him and were able to take off roughly 35 pounds of fluid. Nonetheless he is feeling much better both in regard to his breathing and he's able to get around much better at this time compared to previous. Overall I'm very happy with how things are at this time. There does not appear to be any evidence of infection currently. No fevers, chills, nausea, or vomiting noted at this time. 04/30/2018 patient seen today for follow-up and management of bilateral lower extremity lymphedema. He did express being more sad today than usual due to the recent loss of his dog. He states that he has been compliant with using the lymphedema pumps. However he does admit a minute over the last 2-3 days he has not been using the pumps due to the recent loss of his dog. At this time there is no drainage or open wounds to his lower extremities. The left leg edema is measuring smaller today. Still has a significant amount of edema on bilateral lower extremities With dry flaky skin. He will be referred to  the lymphedema clinic for further management. Will continue 3 layer compression wraps and follow-up in 1-2 weeks.Denies any pain, fever, chairs recently. No recent falls or injuries reported during this visit. 05/07/18 on evaluation today patient actually appears to be doing very well in regard to his lower extremities in general all things considered. With that being said he is having some pain in the legs just due to the amount of  swelling. He does have an area where he had a blister on the left lateral lower extremity this is open at this point other than that there's nothing else weeping at this time. 05/14/18 on evaluation today patient actually appears to be doing excellent all things considered in regard to his lower extremities. He still has a couple areas of weeping on each leg which has continued to be the issue for him. He does have an appointment with the lymphedema clinic although this isn't until February 2020. That was the earliest they had. In the meantime he has continued to tolerate the compression wraps without complication. 05/28/18 on evaluation today patient actually appears to be doing more poorly in regard to his left lower extremity where he has a wound open at this point. He also had a fall where he subsequently injured his right great toe which has led to an open wound at the site unfortunately. He has been tolerating the dressing changes without complication in general as far as the wraps are concerned that he has not been putting any dressing on the left 1st toe ulcer site. 06/11/18 on evaluation today patient appears to be doing much worse in regard to his bilateral lower extremity ulcers. He has been tolerating the dressing changes without complication although his legs have not been wrapped more recently. Overall I am not very pleased with the way his legs appear. I do believe he needs to be back in compression wraps he still has not received his compression wraps from the [facility] hospital as of yet. SHAYLON, ADEN ([id number]) 06/18/18 on evaluation today patient actually appears to be doing significantly better than last time I saw him. He has been tolerating the compression wraps without complication in the circumferential ulcers especially appear to be doing much better. His toe ulcer on the right in regard to the great toe is better although not as good as the legs in my pinion. No fevers  chills noted 07/02/18 on evaluation today patient appears to be doing much better in regard to his lower extremity ulcers. Unfortunately since I last saw him he's had the distal portion of his right great toe if he dated it sounds as if this actually went downhill very quickly. I had only seen him a few days prior and the toe did not appear to be infected at that point subsequently became infected very rapidly and it was decided by the surgeon that the distal portion of the toe needed to be removed. The patient seems to be doing well in this regard he tells me. With that being said his lower extremities are doing better from the standpoint of the wounds although he is significantly swollen at this point. 07/09/18 on evaluation today patient appears to be doing better in regard to the wounds on his lower extremities. In fact everything is almost completely healed he is just a small area on the left posterior lower extremity that is open at this point. 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 With that being said he needs some kind of compression it does sound like he may not be wearing his compression, that is the wraps, during the entire time between when he's here visit to visit. Apparently his wife took the current one off because it began to "fall apart". 07/16/18 on evaluation today patient appears to be extremely swollen especially in regard to his left lower extremity unfortunately. He also has a new skin tear over the left lower extremity and there's a smaller area on the right lower extremity as well. Unfortunately this seems to be due in part to blistering and fluid buildup in his leg. He did get the reduction wraps that were ordered by the [facility] hospital for him to go to lymphedema clinic. With that being said  his wounds on the legs have not healed to the point to where they would likely accept them as a patient lymphedema clinic currently. We need to try to get this to heal. With that being said he's been taking his wraps off which is not doing him any favors at this point. In fact this is probably quite counterproductive compared to what needs to occur. We will likely need to increase to a four layer compression wrap and continue to also utilize elevation and he has to keep the wraps on not take them off as he's been doing currently hasn't had a wrap on since Saturday. 07/23/18 on evaluation today patient appears to be doing much better in regard to his bilateral lower extremities. In fact his left lower extremity which was the largest is actually 15 cm smaller today compared to what it was last time he was here in our clinic. This is obviously good news after just one week. Nonetheless the differences he actually kept the wraps on during the entire week this time. That's not typical for him. I do believe he understands a little bit better now the severity of the situation and why it's important for him to keep these wraps on. 07/30/18 on evaluation today patient actually appears to be doing rather well in regard to his lower extremities. His legs are much smaller than they have been in the past and he actually has only one very small rudder superficial region remaining that is not closed on the left lateral/posterior lower extremity even this is almost completely close. I do believe likely next week he will be healed without any complications. I do think we need to continue the wraps however this seems to be beneficial for him. I also think it may be a good time for [place] to go ahead and see about getting the appointment with the lymphedema clinic which is supposed to be made for him in order to keep this moving along and hopefully get them into compression wraps that will in the end help him to remain  healed. 08/06/18 on evaluation today patient actually appears to be doing very well in regard as bilateral lower extremities. In fact his wounds appear to be completely healed at this time. 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 He is in the process of getting appointment with the lymphedema clinic we have made this referral were just waiting to hear back on the schedule time. We need to follow up on that today as well. 08/13/18 on evaluation today patient actually appears to be  doing very well in regard to his bilateral lower extremities there are no open wounds at this point. We are gonna go ahead and see about ordering the Velcro compression wraps for him had a discussion with them about [place] doing it versus the [place] they feel like they can definitely afford going ahead and get the wraps themselves and they would prefer to try to avoid having to go to the lymphedema clinic if it all possible which I completely understand. As long as he has good compression I'm okay either way. 3/17//20 on evaluation today patient actually appears to be doing well in regard to his bilateral lower extremity ulcers. He has been tolerating the dressing changes without complication specifically the compression wraps. Overall is had no issues my fingers finding that I see at this point is that he is having trouble with constipation. He tells me he has not been able to go to the bathroom for about six days. He's taken to over-the-counter oral laxatives unfortunately this is not helping. He has not contacted his doctor. CHIJIOKE, LASSER ([id number]) 08/27/18 on evaluation today patient appears to be doing fairly well in regard to his lower extremities at this point. There does not appear to be any new altars is swelling is very well controlled. We are still waiting for his Velcro compression wraps to arrive that should be sometime in the next week is Artie sent the  check for these. 09/03/18 on evaluation today patient appears to be doing excellent in regard to his bilateral lower extremities which he shows no signs of wound openings in regard to this point. He does have his Velcro compression wraps which did arrive in the mail since I saw him last week. Overall he is doing excellent in my pinion. 09/13/18 on evaluation today patient appears to be doing very well currently in regard to the overall appearance of his bilateral lower extremities although he's a little bit more swollen than last time we saw him. At that point he been discharged without any open wounds. Nonetheless he has a small open wound on the posterior left lower extremity with some evidence of cellulitis noted as well. Fortunately I feel like he has made good progress overall with regard to his lower extremities from were things used to be. 09/20/18 on evaluation today patient actually appears to be doing much better. The erythematous lower extremity is improving wound itself which is still open appears to be doing much better as far as but appearance as well as pain is concerned overall very pleased in this regard. There's no signs of active infection at this time. 09/27/18 on evaluation today patient's wounds on the lower extremity actually appear to be doing fairly well at this time which is good news. There is no evidence of active infection currently and again is just as left lower extremity were there any wounds at this point anyway. I believe they may be completely healed but again I'm not 100% sure based on evaluation today. I think one more week of observation would likely be a good idea. 10/04/18 on evaluation today patient actually appears to be doing excellent in regard to the left lower extremity which actually appears to be completely healed as of today. Unfortunately he's been having issues with his right lower extremity have a new wound that has opened. Fortunately there's no  evidence of active infection at this time which is good news. 10/10/18 upon evaluation today patient actually appears to be doing a little bit  worse with the new open area on his right posterior lower extremity. He's been tolerating the dressing changes without complication. Right now we been using his compression wraps although I think we may need to switch back to actually performing bilateral compression wraps are in the clinic. No fevers, chills, nausea, or vomiting noted at this time. 10/17/18 on evaluation today patient actually appears to be doing quite well in regard to his bilateral lower extremity ulcers. He has been tolerating the reps without complication although he would prefer not to rewrap his legs as of today. Fortunately there's no signs of active infection which is good news. No fevers, chills, nausea, or vomiting noted at this time. 10/24/18 on evaluation today patient appears to be doing very well in regard to his lower extremities. His right lower extremity is shown signs of healing and his left lower [place] though not healed appears to be improving which is excellent news. Overall very pleased with how things seem to be progressing at this time. The patient is likewise happy to hear this. His Velcro compression wraps however have not been put on properly will gonna show his wife how to do that properly today. 10/31/18 on evaluation today patient appears to be doing more poorly in regard to his left lower extremity in particular although both lower extremities actually are showing some signs of being worse than my previous evaluation. Unfortunately I'm just not sure that his compression stockings even with the use of the compression/lymphedema pumps seem to be controlling this well. Upon further questioning he tells me that he also is not able to lie flat in the bed due to his congestive heart failure and difficulty breathing. For that reason he sleeps in his recliner. To make  matters worse his recliner also cannot even hold his legs up so instead of even being somewhat elevated they pretty much hang down to the floor. This is the way he sleeps each night which is definitely counterproductive to everything else that were attempting to do from the standpoint of controlling his fluid. Nonetheless I think that he potentially could benefit from a hospital bed although this would be something that his primary care provider would likely have to order since anything that is order on our side has to be directly related to wound care and again the hospital bed is not necessarily a direct relation to although I think it does contribute to his overall wound status on his lower extremities. 11/07/18 on evaluation today patient appears to be doing better in regard to his bilateral lower extremity. He's been tolerating the dressing changes without complication. We did in the interim since I last saw him switch to just using extras orbiting new alginate and that seems to have done much better for him. I'm very pleased with the overall progress that is made. 11/14/18 on evaluation today patient appears to be redoing rather well in regard to his left lower extremity ulcers which are the only ones remaining at this point. Fortunately there's no signs of active infection at this time which is good news. Overall been CAPRI, RABEN ([id number]) very pleased with how things seem to be progressing currently. No fevers, chills, nausea, or vomiting noted at this time. Patient History Information obtained from Patient. Family History Cancer - Paternal Grandparents, Kidney Disease - Siblings, Lung Disease - Mother, No family history of Diabetes, Heart Disease, Hereditary Spherocytosis, Hypertension, Seizures, Stroke, Thyroid Problems, Tuberculosis. Social History Never smoker, Marital Status - Married, Alcohol Use -  Never, Drug Use - No History, Caffeine Use - Never. Medical  History Eyes Denies history of Cataracts, Glaucoma, Optic Neuritis Ear/Nose/Mouth/Throat Denies history of Chronic sinus problems/congestion, Middle ear problems Hematologic/Lymphatic Patient has history of Anemia - aplastic anemia, Lymphedema Denies history of Hemophilia, Human Immunodeficiency Virus, Sickle Cell Disease Respiratory Patient has history of Chronic Obstructive Pulmonary Disease (COPD), Sleep Apnea Denies history of Aspiration, Asthma, Pneumothorax, Tuberculosis Cardiovascular Patient has history of Congestive Heart Failure, Coronary Artery Disease, Hypertension, Peripheral Venous Disease Denies history of Angina, Arrhythmia, Deep Vein Thrombosis, Hypotension, Myocardial Infarction, Peripheral Arterial Disease, Phlebitis, Vasculitis Gastrointestinal Denies history of Cirrhosis , Colitis, Crohn s, Hepatitis A, Hepatitis B, Hepatitis C Endocrine Patient has history of Type II Diabetes Denies history of Type I Diabetes Genitourinary Denies history of End Stage Renal Disease Immunological Denies history of Lupus Erythematosus, Raynaud s, Scleroderma Integumentary (Skin) Denies history of History of Burn, History of pressure wounds Musculoskeletal Patient has history of Osteoarthritis Denies history of Gout, Rheumatoid Arthritis, Osteomyelitis Neurologic Patient has history of Neuropathy Denies history of Dementia, Quadriplegia, Paraplegia, Seizure Disorder Oncologic Patient has history of Received Chemotherapy - last dose 2006 Denies history of Received Radiation Psychiatric Denies history of Anorexia/bulimia, Confinement Anxiety Hospitalization/Surgery History - bronchitis. Medical And Surgical History Notes Respiratory home O2 at night as needed Cardiovascular varicose veins Neurologic ROGERICK, BALDWIN ([id number]) CVA - 1992 Review of Systems (ROS) Constitutional Symptoms (General Health) Denies complaints or symptoms of Fatigue, Fever, Chills, Marked  Weight Change. Respiratory Denies complaints or symptoms of Chronic or frequent coughs, Shortness of Breath. Cardiovascular Complains or has symptoms of LE edema. Denies complaints or symptoms of Chest pain. Psychiatric Denies complaints or symptoms of Anxiety, Claustrophobia. Objective Constitutional Well-nourished and well-hydrated in no acute distress. Vitals Time Taken: 1:55 PM, Height: 66 in, Weight: 323 lbs, BMI: 52.1, Temperature: 98.2 F, Pulse: 62 bpm, Respiratory Rate: 18 breaths/min, Blood Pressure: 135/58 mmHg. Respiratory normal breathing without difficulty. clear to auscultation bilaterally. Cardiovascular regular rate and rhythm with normal S1, S2. Psychiatric this patient is able to make decisions and demonstrates good insight into disease process. Alert and Oriented x 3. pleasant and cooperative. General Notes: Upon inspection patient's wound gets appear to be doing quite well there's no signs of active infection which is good news. Overall I think he is very close to complete resolution I do believe we can use his poker compression wraps for the right lower extremity although I would recommend continuing to wrap the left lower extremity is in agreement with this plan. Integumentary (Hair, Skin) Wound #22 status is Open. Original cause of wound was Gradually Appeared. The wound is located on the Left,Circumferential Lower Leg. The wound measures 1.5cm length x 30cm width x 0.1cm depth; 35.343cm^2 area and 3.534cm^3 volume. The wound is limited to skin breakdown. There is no tunneling or undermining noted. There is a large amount of serous drainage noted. The wound margin is flat and intact. There is large (67-100%) pink granulation within the wound bed. There is no necrotic tissue within the wound bed. 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 There was a Four Layer  Compression Therapy Procedure with a pre-treatment ABI of 1.1 by Montey Hora, RN. Post procedure Diagnosis Wound #22: Same as Pre-Procedure Plan Wound Cleansing: Wound #22 Left,Circumferential Lower Leg: Cleanse wound with mild soap and water May Shower, gently pat wound dry prior to applying new dressing. Skin Barriers/Peri-Wound Care: Wound #22 Left,Circumferential Lower Leg: Barrier cream Primary Wound Dressing: Wound #22 Left,Circumferential Lower Leg: Non-adherent pad Dressing Change Frequency: Wound #22 Left,Circumferential Lower Leg: Dressing is to be changed Monday and Thursday. Follow-up Appointments: Wound #22 Left,Circumferential Lower Leg: Return Appointment in 1 week. - Monday and Thursday Nurse Visit as needed Edema Control: Wound #22 Left,Circumferential Lower Leg: 4-Layer Compression System - Left Lower Extremity. Elevate legs to the level of the heart and pump ankles as often as possible Additional Orders / Instructions: Wound #22 Left,Circumferential Lower Leg: Other: - Talk with PCP about getting a hospital bed to sleep in. Adam Merritt, Adam Merritt ([id number]) My suggestion currently is gonna be that we continue with the above wound care measures for the next week and the patient is in agreement with that plan. We will subsequently see were things stand at follow-up. If anything changes or worsens in the meantime he will contact the office and let me know. Please see above for specific wound care orders. We will see patient for re-evaluation in 1 week(s) here in the clinic. If anything worsens or changes patient will contact our office  for additional recommendations. Electronic Signature(s) Signed: 11/18/2018 12:02:24 PM By: Worthy Keeler PA-C Entered By: Worthy Keeler on 11/14/2018 16:06:36 Adam Merritt ([id number]) -------------------------------------------------------------------------------- ROS/PFSH Details Patient Name: Adam Merritt Date of Service: 11/14/2018 1:45 PM Medical Record Number: [id number] Patient Account Number: [id number] Date of Birth/Sex: [birth year] (73 y.o. M) Treating RN: Montey Hora Primary Care Provider: [name] Other Clinician: Referring Provider: [name] Treating Provider/Extender: Melburn Hake, [name]: 31 Information Obtained From Patient Constitutional Symptoms (General Health) Complaints and Symptoms: Negative for: Fatigue; Fever; Chills; Marked Weight Change Respiratory Complaints and Symptoms: Negative for: Chronic or frequent coughs; Shortness of Breath Medical History: Positive for: Chronic Obstructive Pulmonary Disease (COPD); Sleep Apnea Negative for: Aspiration; Asthma; Pneumothorax; Tuberculosis Past Medical History Notes: home O2 at night as needed Cardiovascular Complaints and Symptoms: Positive for: LE edema Negative for: Chest pain Medical History: Positive for: Congestive Heart Failure; Coronary Artery Disease; Hypertension; Peripheral Venous Disease Negative for: Angina; Arrhythmia; Deep Vein Thrombosis; Hypotension; Myocardial Infarction; Peripheral Arterial Disease; Phlebitis; Vasculitis Past Medical History Notes: varicose veins Psychiatric Complaints and Symptoms: Negative for: Anxiety; Claustrophobia Medical History: Negative for: Anorexia/bulimia; Confinement Anxiety Eyes Medical History: Negative for: Cataracts; Glaucoma; Optic Neuritis Ear/Nose/Mouth/Throat Medical History: Negative for: Chronic sinus problems/congestion; Middle ear problems Adam Merritt, Adam Merritt ([id number]) Hematologic/Lymphatic Medical  History: Positive for: Anemia - aplastic anemia; Lymphedema Negative for: Hemophilia; Human Immunodeficiency Virus; Sickle Cell Disease Gastrointestinal Medical History: Negative for: Cirrhosis ; Colitis; Crohnos; Hepatitis A; Hepatitis B; Hepatitis C Endocrine Medical History: Positive for: Type II Diabetes Negative for: Type I Diabetes Time with diabetes: since 2006 Treated with: Insulin Blood sugar tested every day: Yes Tested : Blood sugar testing results: Breakfast: 209 Genitourinary Medical History: Negative for: End Stage Renal Disease Immunological Medical History: Negative for: Lupus Erythematosus; Raynaudos; Scleroderma Integumentary (Skin) Medical History: Negative for: History of Burn; History of pressure wounds Musculoskeletal Medical History: Positive for: Osteoarthritis Negative for: Gout; Rheumatoid Arthritis; Osteomyelitis Neurologic Medical History: Positive for: Neuropathy Negative for: Dementia; Quadriplegia; Paraplegia; Seizure Disorder Past Medical History Notes: CVA -  1992 Oncologic Medical History: Positive for: Received Chemotherapy - last dose 2006 Negative for: Received Radiation Immunizations Pneumococcal Vaccine: Adam Merritt, Adam Merritt ([id number]) Received Pneumococcal Vaccination: Yes Implantable Devices No devices added Hospitalization / Surgery History Type of Hospitalization/Surgery bronchitis Family and Social History Cancer: Yes - Paternal Grandparents; Diabetes: No; Heart Disease: No; Hereditary Spherocytosis: No; Hypertension: No; Kidney Disease: Yes - Siblings; Lung Disease: Yes - Mother; Seizures: No; Stroke: No; Thyroid Problems: No; Tuberculosis: No; Never smoker; Marital Status - Married; Alcohol Use: Never; Drug Use: No History; Caffeine Use: Never; Financial Concerns: No; Food, Clothing or Shelter Needs: No; Support System Lacking: No; Transportation Concerns: No Physician Affirmation I have reviewed and agree with the above  information. Electronic Signature(s) Signed: 11/15/2018 5:04:52 PM By: Montey Hora Signed: 11/18/2018 12:02:24 PM By: Worthy Keeler PA-C Entered By: Worthy Keeler on 11/14/2018 16:05:52 Adam Merritt ([id number]) -------------------------------------------------------------------------------- SuperBill Details Patient Name: Adam Merritt Date of Service: 11/14/2018 Medical Record Number: [id number] Patient Account Number: [id number] Date of Birth/Sex: 10/02/1945 (73 y.o. 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 E66.9 Obesity, unspecified Facility Procedures CPT4 Code: [id number] Description: (Facility Use Only) 29581LT - [place] LWR LT LEG Modifier: Quantity: 1 Physician Procedures CPT4 Code Description: [id number] - WC PHYS LEVEL 4 - EST PT ICD-10 Diagnosis Description I89.0 Lymphedema, not elsewhere classified L97.512 Non-pressure chronic ulcer of other part of right foot with fat L97.822 Non-pressure chronic ulcer of other  part of left lower leg with L98.492 Non-pressure chronic ulcer of skin of other sites with fat layer Modifier: layer exposed fat layer expos exposed Quantity: 1 ed Electronic Signature(s) Signed: 11/18/2018 12:02:24 PM By: Worthy Keeler PA-C Previous Signature: 11/14/2018 3:09:24 PM Version By: Montey Hora Entered By: Worthy Keeler on 11/14/2018 16:06:52

## 2018-11-18 NOTE — Progress Notes (Signed)
MARYLAND, LUPPINO ([id number]) Visit Report for 11/14/2018 Arrival Information Details Patient Name: DIALLO, PONDER Date of Service: 11/14/2018 1:45 PM Medical Record Number: [id number] Patient Account Number: [id number] Date of Birth/Sex: Jul 21, 1945 (73 y.o. M) Treating RN: Montey Hora Primary Care Almas Rake: [name] Other Clinician: Referring Trenton Passow: [name] Treating Mariell Nester/Extender: Melburn Hake, [name]: 65 Visit Information History Since Last Visit Added or deleted any medications: No Patient Arrived: Walker Any new allergies or adverse reactions: No Arrival Time: 13:53 Had a fall or experienced change in No Accompanied By: self activities of daily living that may affect Transfer Assistance: None risk of falls: Patient Identification Verified: Yes Signs or symptoms of abuse/neglect since last visito No Secondary Verification Process Yes Hospitalized since last visit: No Completed: Implantable device outside of the clinic excluding No Patient Has Alerts: Yes cellular tissue based products placed in the center Patient Alerts: Patient on Blood since last visit: Thinner Has Dressing in Place as Prescribed: Yes Aspirin 325mg  Has Compression in Place as Prescribed: Yes Type II Diabetes ABI 5/19 L 1.1 R 0.9 Pain Present Now: No TBI L 0.6 R 0.6 Electronic Signature(s) Signed: 11/14/2018 3:09:24 PM By: Montey Hora Entered By: Montey Hora on 11/14/2018 13:55:08 [name] ([id number]) -------------------------------------------------------------------------------- Compression Therapy Details Patient Name: [name] Date of Service: 11/14/2018 1:45 PM Medical Record Number: [id number] Patient Account Number: [id number] Date of Birth/Sex: 08-26-1945 (73 y.o. M) Treating RN: Montey Hora Primary Care Jaylani Mcguinn: [name] Other Clinician: Referring Davin Archuletta: [name] Treating Coltrane Tugwell/Extender: Melburn Hake, [name]:  31 Compression Therapy Performed for Wound Assessment: Wound #22 Left,Circumferential Lower Leg Performed By: Clinician Montey Hora, RN Compression Type: Four Layer Pre Treatment ABI: 1.1 Post Procedure Diagnosis Same as Pre-procedure Electronic Signature(s) Signed: 11/14/2018 3:09:24 PM By: Montey Hora Entered By: Montey Hora on 11/14/2018 14:21:16 [name] ([id number]) -------------------------------------------------------------------------------- Encounter Discharge Information Details Patient Name: [name] Date of Service: 11/14/2018 1:45 PM Medical Record Number: [id number] Patient Account Number: [id number] Date of Birth/Sex: [birth year] (73 y.o. M) Treating RN: Montey Hora Primary Care Evangelyn Crouse: [name] Other Clinician: Referring Quianna Avery: [name] Treating Lafayette Dunlevy/Extender: Melburn Hake, [name]: 36 Encounter Discharge Information Items Discharge Condition: Stable Ambulatory Status: Walker Discharge Destination: Home Transportation: Private Auto Accompanied By: self Schedule Follow-up Appointment: Yes Clinical Summary of Care: Electronic Signature(s) Signed: 11/14/2018 3:09:24 PM By: Montey Hora Entered By: Montey Hora on 11/14/2018 14:50:45 [name] ([id number]) -------------------------------------------------------------------------------- Lower Extremity Assessment Details Patient Name: [name] Date of Service: 11/14/2018 1:45 PM Medical Record Number: [id number] Patient Account Number: [id number] Date of Birth/Sex: [birth year] (73 y.o. M) Treating RN: Montey Hora Primary Care Elia Nunley: [name] Other Clinician: Referring Harpreet Pompey: [name] Treating Nader Boys/Extender: Melburn Hake, [name]: 31 Edema Assessment Assessed: [Left: No] [Right: No] Edema: [Left: Ye] [Right: s] Calf Left: Right: Point of Measurement: 37 cm From Medial Instep 59 cm 48 cm Ankle Left: Right: Point of  Measurement: 12 cm From Medial Instep 35.5 cm 29 cm Vascular Assessment Pulses: Dorsalis Pedis Palpable: [Left:Yes] [Right:Yes] Electronic Signature(s) Signed: 11/14/2018 3:09:24 PM By: Montey Hora Entered By: Montey Hora on 11/14/2018 14:12:19 [name] ([id number]) -------------------------------------------------------------------------------- Multi Wound Chart Details Patient Name: [name] Date of Service: 11/14/2018 1:45 PM Medical Record Number: [id number] Patient Account Number: [id number] Date of Birth/Sex: 29-Oct-1945 (73 y.o. M) Treating RN: Montey Hora Primary Care Arleny Kruger: [name] Other Clinician: Referring Harol Shabazz: [name] Treating Alvis Edgell/Extender: Melburn Hake,  [name]: 31 Vital Signs Height(in): 66 Pulse(bpm): 62 Weight(lbs): 323 Blood Pressure(mmHg): 135/58 Body Mass Index(BMI): 52 Temperature(F): 98.2 Respiratory Rate 18 (breaths/min): Photos: [N/A:N/A] Wound Location: Left Lower Leg - N/A N/A Circumferential Wounding Event: Gradually Appeared N/A N/A Primary Etiology: Lymphedema N/A N/A Comorbid History: Anemia, Lymphedema, N/A N/A Chronic Obstructive Pulmonary Disease (COPD), Sleep Apnea, Congestive Heart Failure, Coronary Artery Disease, Hypertension, Peripheral Venous Disease, Type II Diabetes, Osteoarthritis, Neuropathy, Received Chemotherapy Date Acquired: 10/25/2018 N/A N/A Weeks of Treatment: 2 N/A N/A Wound Status: Open N/A N/A Measurements L x W x D 1.5x30x0.1 N/A N/A (cm) Area (cm) : 35.343 N/A N/A Volume (cm) : 3.534 N/A N/A JOHNATHAN, HESKETT ([id number]) % Reduction in Area: 75.60% N/A N/A % Reduction in Volume: 75.60% N/A N/A Classification: Partial Thickness N/A N/A Exudate Amount: Large N/A N/A Exudate Type: Serous N/A N/A Exudate Color: amber N/A N/A Wound Margin: Flat and Intact N/A N/A Granulation Amount: Large (67-100%) N/A N/A Granulation Quality: Pink N/A N/A Necrotic  Amount: None Present (0%) N/A N/A Exposed Structures: Fascia: No N/A N/A Fat Layer (Subcutaneous Tissue) Exposed: No Tendon: No Muscle: No Joint: No Bone: No Limited to Skin Breakdown Epithelialization: Medium (34-66%) N/A N/A Treatment Notes Electronic Signature(s) Signed: 11/14/2018 3:09:24 PM By: Montey Hora Entered By: Montey Hora on 11/14/2018 14:20:17 [name] ([id number]) -------------------------------------------------------------------------------- [place] Details Patient Name: [name] Date of Service: 11/14/2018 1:45 PM Medical Record Number: [id number] Patient Account Number: [id number] Date of Birth/Sex: 08/11/1945 (73 y.o. M) Treating RN: Montey Hora Primary Care Ileene Allie: [name] Other Clinician: Referring Maddex Garlitz: [name] Treating Marisal Swarey/Extender: Melburn Hake, [name]: 17 Active Inactive Abuse / Safety / Falls / Self Care Management Nursing Diagnoses: Potential for falls Goals: Patient will remain injury free related to falls Date Initiated: 04/09/2018 Target Resolution Date: 10/05/2018 Goal Status: Active Interventions: Assess fall risk on admission and as needed Notes: Venous Leg Ulcer Nursing Diagnoses: Actual venous Insuffiency (use after diagnosis is confirmed) Goals: Patient will maintain optimal edema control Date Initiated: 04/09/2018 Target Resolution Date: 10/05/2018 Goal Status: Active Interventions: Assess peripheral edema status every visit. Compression as ordered Provide education on venous insufficiency Notes: Electronic Signature(s) Signed: 11/14/2018 3:09:24 PM By: Montey Hora Entered By: Montey Hora on 11/14/2018 14:20:05 [name] ([id number]) -------------------------------------------------------------------------------- Pain Assessment Details Patient Name: [name] Date of Service: 11/14/2018 1:45 PM Medical Record Number: [id number] Patient  Account Number: [id number] Date of Birth/Sex: 1945-09-28 (73 y.o. M) Treating RN: Montey Hora Primary Care Snow Peoples: [name] Other Clinician: Referring Gurtha Picker: [name] Treating Shimon Trowbridge/Extender: Melburn Hake, [name]: 31 Active Problems Location of Pain Severity and Description of Pain Patient Has Paino No Site Locations Pain Management and Medication Current Pain Management: Electronic Signature(s) Signed: 11/14/2018 3:09:24 PM By: Montey Hora Entered By: Montey Hora on 11/14/2018 13:55:14 [name] ([id number]) -------------------------------------------------------------------------------- Patient/Caregiver Education Details Patient Name: [name] Date of Service: 11/14/2018 1:45 PM Medical Record Number: [id number] Patient Account Number: [id number] Date of Birth/Gender: 1945/12/12 (73 y.o. M) Treating RN: Montey Hora Primary Care Physician: [name] Other Clinician: Referring Physician: [name] Treating Physician/Extender: Sharalyn Ink in Treatment: 65 Education Assessment Education Provided To: Patient Education Topics Provided Venous: Handouts: Other: elevate your legs at night Methods: Explain/Verbal Responses: State content correctly Electronic Signature(s) Signed: 11/14/2018 3:09:24 PM By: Montey Hora Entered By: Montey Hora on 11/14/2018 14:50:02 [name] ([id number]) -------------------------------------------------------------------------------- Wound Assessment Details Patient Name: ZAELYN, BARBARY  W. Date of Service: 11/14/2018 1:45 PM Medical Record Number: [id number] Patient Account Number: [id number] Date of Birth/Sex: 1945/09/05 (73 y.o. M) Treating RN: Montey Hora Primary Care Marianna [name]: [name] Other Clinician: Referring Kharon Hixon: [name] Treating Meghen Akopyan/Extender: Melburn Hake, [name]: 31 Wound Status Wound Number: 22 Primary Lymphedema Etiology: Wound  Location: Left Lower Leg - Circumferential Wound Open Wounding Event: Gradually Appeared Status: Date Acquired: 10/25/2018 Comorbid Anemia, Lymphedema, Chronic Obstructive Weeks Of Treatment: 2 History: Pulmonary Disease (COPD), Sleep Apnea, Clustered Wound: No Congestive Heart Failure, Coronary Artery Disease, Hypertension, Peripheral Venous Disease, Type II Diabetes, Osteoarthritis, Neuropathy, Received Chemotherapy Photos Wound Measurements Length: (cm) 1.5 Width: (cm) 30 Depth: (cm) 0.1 Area: (cm) 35.343 Volume: (cm) 3.534 % Reduction in Area: 75.6% % Reduction in Volume: 75.6% Epithelialization: Medium (34-66%) Tunneling: No Undermining: No Wound Description Classification: Partial Thickness Foul Odo Wound Margin: Flat and Intact Slough/F Exudate Amount: Large Exudate Type: Serous Exudate Color: amber r After Cleansing: No ibrino No Wound Bed Granulation Amount: Large (67-100%) Exposed Structure Granulation Quality: Pink Fascia Exposed: No Necrotic Amount: None Present (0%) Fat Layer (Subcutaneous Tissue) Exposed: No Tendon Exposed: No Muscle Exposed: No Joint Exposed: No Bone Exposed: No ADIEL, ERNEY ([id number]) Limited to Skin Breakdown Treatment Notes Wound #22 (Left, Circumferential Lower Leg) Notes non adherent pad, 4 layer wrap Electronic Signature(s) Signed: 11/14/2018 3:09:24 PM By: Montey Hora Entered By: Montey Hora on 11/14/2018 14:09:48 [name] ([id number]) -------------------------------------------------------------------------------- Vitals Details Patient Name: [name] Date of Service: 11/14/2018 1:45 PM Medical Record Number: [id number] Patient Account Number: [id number] Date of Birth/Sex: Jan 22, 1946 (73 y.o. M) Treating RN: Montey Hora Primary Care Lylee Corrow: [name] Other Clinician: Referring Danarius Mcconathy: [name] Treating Labrenda Lasky/Extender: Melburn Hake, [name]: 31 Vital Signs Time  Taken: 13:55 Temperature (F): 98.2 Height (in): 66 Pulse (bpm): 62 Weight (lbs): 323 Respiratory Rate (breaths/min): 18 Body Mass Index (BMI): 52.1 Blood Pressure (mmHg): 135/58 Reference Range: 80 - 120 mg / dl Electronic Signature(s) Signed: 11/14/2018 3:09:24 PM By: Montey Hora Entered By: Montey Hora on 11/14/2018 13:56:05

## 2018-11-20 ENCOUNTER — Telehealth: Payer: Self-pay

## 2018-11-20 NOTE — Telephone Encounter (Signed)
Faxed patient information for [facility] bed to [facility] and informed the patient that if he didn't hear back from [place] by Friday to call the office on Friday.

## 2018-11-21 ENCOUNTER — Other Ambulatory Visit: Payer: Self-pay

## 2018-11-21 ENCOUNTER — Encounter: Payer: Medicare Other | Admitting: Physician Assistant

## 2018-11-21 ENCOUNTER — Telehealth: Payer: Self-pay

## 2018-11-21 DIAGNOSIS — E11621 Type 2 diabetes mellitus with foot ulcer: Secondary | ICD-10-CM | POA: Diagnosis not present

## 2018-11-21 NOTE — Telephone Encounter (Signed)
Pt wife  Advised we send paperwork for [facility] bed to [facility] also spoke with [facility] they will call insurance for approved and call pt when is ready

## 2018-11-22 NOTE — Progress Notes (Addendum)
JAYVIER, BURGHER ([id number]) Visit Report for 11/21/2018 Chief Complaint Document Details Patient Name: Adam Merritt, Adam Merritt Date of Service: 11/21/2018 2:15 PM Medical Record Number: [id number] Patient Account Number: [id number] Date of Birth/Sex: 09-Jan-1946 (73 y.o. M) Treating RN: Army Melia Primary Care Provider: [name] Other Clinician: Referring Provider: [name] Treating Provider/Extender: Melburn Hake, Sanaa Zilberman [name]: 19 Information Obtained from: Patient Chief Complaint he is here for evaluation of bilateral lower extremity lymphedema right right toe and left LE ulcers Electronic Signature(s) Signed: 11/22/2018 1:16:38 PM By: Worthy Keeler PA-C Entered By: Worthy Keeler on 11/21/2018 15:01:38 Adam Merritt ([id number]) -------------------------------------------------------------------------------- HPI Details Patient Name: Adam Merritt Date of Service: 11/21/2018 2:15 PM Medical Record Number: [id number] Patient Account Number: [id number] Date of Birth/Sex: [birth year] (73 y.o. M) Treating RN: Army Melia Primary Care Provider: [name] Other Clinician: Referring Provider: [name] Treating Provider/Extender: Melburn Hake, Gearlene Godsil [name]: 63 History of Present Illness HPI Description: 10/18/17-He is here for initial evaluation of bilateral lower extremity ulcerations in the presence of venous insufficiency and lymphedema. He has been seen by vascular medicine in the past, Dr. Lucky Cowboy, last seen in 2016. He does have a history of abnormal ABIs, which is to be expected given his lymphedema and venous insufficiency. According to Epic, it appears that all attempts for arterial evaluation and/or angiography were not follow through with by patient. He does have a history of being seen in lymphedema clinic in 2018, stopped going approximately 6 months ago stating "it didn't do any good". He does not have lymphedema pumps, he does not have custom fit compression  wrap/stockings. He is diabetic and his recent A1c last month was 7.6. He admits to chronic bilateral lower extremity pain, no change in pain since blister and ulceration development. He is currently being treated with Levaquin for bronchitis. He has home health and we will continue. 10/25/17-He is here in follow-up evaluation for bilateral lower extremity ulcerationssubtle he remains on Levaquin for bronchitis. Right lower extremity with no evidence of drainage or ulceration, persistent left lower extremity ulceration. He states that home health has not been out since his appointment. He went to [facility] on Tuesday, studies revealed: RIGHT ABI 0.9, TBI 0.6 LEFT ABI 1.1, TBI 0.6 with triphasic flow bilaterally. We will continue his same treatment plan. He has been educated on compression therapy and need for elevation. He will benefit from lymphedema pumps 11/01/17-He is here in follow-up evaluation for left lower extremity ulcer. The right lower extremity remains healed. He has home health services, but they have not been out to see the patient for 2-3 weeks. He states it home health physical therapy changed his dressing yesterday after therapy; he placed Ace wrap compression. We are still waiting for lymphedema pumps, reordered d/t need for company change. 11/08/17-He is here in follow-up evaluation for left lower extremity ulcer. It is improved. Edema is significantly improved with compression therapy. We will continue with same treatment plan and he will follow-up next week. No word regarding lymphedema pumps 11/15/17-He is here in follow-up evaluation for left lower extremity ulcer. He is healed and will be discharged from wound care services. I have reached out to [facility] regarding his lymphedema pumps. They have been unable to reach the patient; the contact number they had with the patient's wife's cell phone and she has not answered any unrecognized calls. Contact  should be made today, trial planned for next week; [facility] will continue to  follow 11/27/17 on evaluation today patient has multiple blistered areas over the right lower extremity his left lower extremity appears to be doing okay. These blistered areas show signs of no infection which is great news. With that being said he did have some necrotic skin overlying which was mechanically debrided away with saline and gauze today without complication. Overall post debridement the wounds appear to be doing better but in general his swelling seems to be increased. This is obviously not good news. I think this is what has given rise to the blisters. 12/04/17 on evaluation today patient presents for follow-up concerning his bilateral lower extremity edema in the right lower extremity ulcers. He has been tolerating the dressing changes without complication. With that being said he has had no real issues with the wraps which is also good news. Overall I'm pleased with the progress he's been making. 12/11/17 on evaluation today patient appears to be doing rather well in regard to his right lateral lower extremity ulcer. He's been tolerating the dressing changes without complication. Fortunately there does not appear to be any evidence of infection at this time. Overall I'm pleased with the progress that is being made. Unfortunately he has been in the hospital due to having what sounds to be a stomach virus/flu fortunately that is starting to get better. 12/18/17 on evaluation today patient actually appears to be doing very well in regard to his bilateral lower extremities the swelling is under fairly good control his lymphedema pumps are still not up and running quite yet. With that being said he does have several areas of opening noted as far as wounds are concerned mainly over the left lower extremity. With that being said I do believe once he gets lymphedema pumps this would least help you mention some the  fluid and preventing this from occurring. Hopefully that will be set up soon sleeves are Artie in place at his home he just waiting for the machine. 12/25/17 on evaluation today patient actually appears to be doing excellent in fact all of his ulcers appear to have resolved his MICHEIL, KLAUS. ([id number]) legs appear very well. I do think he needs compression stockings we have discussed this and they are actually going to go to [facility] today to elastic therapy to get this fitted for him. I think that is definitely a good thing to do. Readmission: 04/09/18 upon evaluation today this patient is seen for readmission due to bilateral lower extremity lymphedema. He has significant swelling of his extremities especially on the left although the right is also swollen he has weeping from both sides. There are no obvious open wounds at this point. Fortunately he has been doing fairly well for quite a bit of time since I last saw him. Nonetheless unfortunately this seems to have reopened and is giving quite a bit of trouble. He states this began about a week ago when he first called [place] to get in to be seen. No fevers, chills, nausea, or vomiting noted at this time. He has not been using his lymphedema pumps due to the fact that they won't fit on his leg at this point likewise is also not been using his compression for essentially the same reason. 04/16/18 upon evaluation today patient actually appears to be doing a little better in regard to the fluid in his bilateral lower extremities. With that being said he's had three falls since I saw him last week. He also states that he's been feeling very poorly. I was  concerned last week and feel like that the concern is still there as far as the congestion in his chest is concerned he seems to be breathing about the same as last week but again he states he's very weak he's not even able to walk further than from the chair to the door. His wife had to buy a  wheelchair just to be able to get them out of the house to get to the appointment today. This has me very concerned. 04/23/18 on evaluation today patient actually appears to be doing much better than last week's evaluation. At that time actually had to transport him to the ER via EMS and he subsequently was admitted for acute pulmonary edema, acute renal failure, and acute congestive heart failure. Fortunately he is doing much better. Apparently they did dialyze him and were able to take off roughly 35 pounds of fluid. Nonetheless he is feeling much better both in regard to his breathing and he's able to get around much better at this time compared to previous. Overall I'm very happy with how things are at this time. There does not appear to be any evidence of infection currently. No fevers, chills, nausea, or vomiting noted at this time. 04/30/2018 patient seen today for follow-up and management of bilateral lower extremity lymphedema. He did express being more sad today than usual due to the recent loss of his dog. He states that he has been compliant with using the lymphedema pumps. However he does admit a minute over the last 2-3 days he has not been using the pumps due to the recent loss of his dog. At this time there is no drainage or open wounds to his lower extremities. The left leg edema is measuring smaller today. Still has a significant amount of edema on bilateral lower extremities With dry flaky skin. He will be referred to the lymphedema clinic for further management. Will continue 3 layer compression wraps and follow-up in 1-2 weeks.Denies any pain, fever, chairs recently. No recent falls or injuries reported during this visit. 05/07/18 on evaluation today patient actually appears to be doing very well in regard to his lower extremities in general all things considered. With that being said he is having some pain in the legs just due to the amount of swelling. He does have an area  where he had a blister on the left lateral lower extremity this is open at this point other than that there's nothing else weeping at this time. 05/14/18 on evaluation today patient actually appears to be doing excellent all things considered in regard to his lower extremities. He still has a couple areas of weeping on each leg which has continued to be the issue for him. He does have an appointment with the lymphedema clinic although this isn't until February 2020. That was the earliest they had. In the meantime he has continued to tolerate the compression wraps without complication. 05/28/18 on evaluation today patient actually appears to be doing more poorly in regard to his left lower extremity where he has a wound open at this point. He also had a fall where he subsequently injured his right great toe which has led to an open wound at the site unfortunately. He has been tolerating the dressing changes without complication in general as far as the wraps are concerned that he has not been putting any dressing on the left 1st toe ulcer site. 06/11/18 on evaluation today patient appears to be doing much worse in regard to his  bilateral lower extremity ulcers. He has been tolerating the dressing changes without complication although his legs have not been wrapped more recently. Overall I am not very pleased with the way his legs appear. I do believe he needs to be back in compression wraps he still has not received his compression wraps from the [facility] hospital as of yet. 06/18/18 on evaluation today patient actually appears to be doing significantly better than last time I saw him. He has been tolerating the compression wraps without complication in the circumferential ulcers especially appear to be doing much better. His toe ulcer on the right in regard to the great toe is better although not as good as the legs in my pinion. No fevers chills noted AQIL, GOETTING ([id number]) 07/02/18 on evaluation  today patient appears to be doing much better in regard to his lower extremity ulcers. Unfortunately since I last saw him he's had the distal portion of his right great toe if he dated it sounds as if this actually went downhill very quickly. I had only seen him a few days prior and the toe did not appear to be infected at that point subsequently became infected very rapidly and it was decided by the surgeon that the distal portion of the toe needed to be removed. The patient seems to be doing well in this regard he tells me. With that being said his lower extremities are doing better from the standpoint of the wounds although he is significantly swollen at this point. 07/09/18 on evaluation today patient appears to be doing better in regard to the wounds on his lower extremities. In fact everything is almost completely healed he is just a small area on the left posterior lower extremity that is open at this point. 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 With that being said he needs some kind of compression it does sound like he may not be wearing his compression, that is the wraps, during the entire time between when he's here visit to visit. Apparently his wife took the current one off because it began to "fall apart". 07/16/18 on evaluation today patient appears to be extremely swollen especially in regard to his left lower extremity unfortunately. He also has a new skin tear over the left lower extremity and there's a smaller area on the right lower extremity as well. Unfortunately this seems to be due in part to blistering and fluid buildup in his leg. He did get the reduction wraps that were ordered by the [facility] hospital for him to go to lymphedema clinic. With that being said his wounds on the legs have not healed  to the point to where they would likely accept them as a patient lymphedema clinic currently. We need to try to get this to heal. With that being said he's been taking his wraps off which is not doing him any favors at this point. In fact this is probably quite counterproductive compared to what needs to occur. We will likely need to increase to a four layer compression wrap and continue to also utilize elevation and he has to keep the wraps on not take them off as he's been doing currently hasn't had a wrap on since Saturday. 07/23/18 on evaluation today patient appears to be doing much better in regard to his bilateral lower extremities.  In fact his left lower extremity which was the largest is actually 15 cm smaller today compared to what it was last time he was here in our clinic. This is obviously good news after just one week. Nonetheless the differences he actually kept the wraps on during the entire week this time. That's not typical for him. I do believe he understands a little bit better now the severity of the situation and why it's important for him to keep these wraps on. 07/30/18 on evaluation today patient actually appears to be doing rather well in regard to his lower extremities. His legs are much smaller than they have been in the past and he actually has only one very small rudder superficial region remaining that is not closed on the left lateral/posterior lower extremity even this is almost completely close. I do believe likely next week he will be healed without any complications. I do think we need to continue the wraps however this seems to be beneficial for him. I also think it may be a good time for [place] to go ahead and see about getting the appointment with the lymphedema clinic which is supposed to be made for him in order to keep this moving along and hopefully get them into compression wraps that will in the end help him to remain healed. 08/06/18 on evaluation today patient  actually appears to be doing very well in regard as bilateral lower extremities. In fact his wounds appear to be completely healed at this time. 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 He is in the process of getting appointment with the lymphedema clinic we have made this referral were just waiting to hear back on the schedule time. We need to follow up on that today as well. 08/13/18 on evaluation today patient actually appears to be doing very well in regard to his bilateral lower extremities there are no open wounds at this point. We are gonna go ahead and see about ordering the Velcro compression wraps for him had a discussion with them about [place] doing it versus the [place] they feel like they can definitely afford going ahead and get the wraps themselves and they would prefer to try to avoid having to go to the lymphedema clinic if it all possible which I completely understand. As long as he has good compression I'm okay either way. 3/17//20 on evaluation today patient actually appears to be doing well in regard to his bilateral lower extremity ulcers. He has been tolerating the dressing changes without complication specifically the compression wraps. Overall is had no issues my fingers finding that I see at this point is that he is having trouble with constipation. He tells me he has not been able to go to the bathroom for about six days. He's taken to over-the-counter oral laxatives unfortunately this is not helping. He has not contacted his doctor. 08/27/18 on evaluation today patient appears to be doing fairly well in regard to his lower extremities at this point. There does not appear to be any new altars is swelling is very well controlled. We are still waiting for his Velcro compression wraps to arrive that should be sometime in the next week is Artie sent the check for these. 09/03/18 on evaluation today patient appears to be doing  excellent in regard to his bilateral lower extremities which he shows TARICK, PARENTEAU. ([id number]) no signs of wound openings in regard to this point. He does have  his Velcro compression wraps which did arrive in the mail since I saw him last week. Overall he is doing excellent in my pinion. 09/13/18 on evaluation today patient appears to be doing very well currently in regard to the overall appearance of his bilateral lower extremities although he's a little bit more swollen than last time we saw him. At that point he been discharged without any open wounds. Nonetheless he has a small open wound on the posterior left lower extremity with some evidence of cellulitis noted as well. Fortunately I feel like he has made good progress overall with regard to his lower extremities from were things used to be. 09/20/18 on evaluation today patient actually appears to be doing much better. The erythematous lower extremity is improving wound itself which is still open appears to be doing much better as far as but appearance as well as pain is concerned overall very pleased in this regard. There's no signs of active infection at this time. 09/27/18 on evaluation today patient's wounds on the lower extremity actually appear to be doing fairly well at this time which is good news. There is no evidence of active infection currently and again is just as left lower extremity were there any wounds at this point anyway. I believe they may be completely healed but again I'm not 100% sure based on evaluation today. I think one more week of observation would likely be a good idea. 10/04/18 on evaluation today patient actually appears to be doing excellent in regard to the left lower extremity which actually appears to be completely healed as of today. Unfortunately he's been having issues with his right lower extremity have a new wound that has opened. Fortunately there's no evidence of active infection at this time which is  good news. 10/10/18 upon evaluation today patient actually appears to be doing a little bit worse with the new open area on his right posterior lower extremity. He's been tolerating the dressing changes without complication. Right now we been using his compression wraps although I think we may need to switch back to actually performing bilateral compression wraps are in the clinic. No fevers, chills, nausea, or vomiting noted at this time. 10/17/18 on evaluation today patient actually appears to be doing quite well in regard to his bilateral lower extremity ulcers. He has been tolerating the reps without complication although he would prefer not to rewrap his legs as of today. Fortunately there's no signs of active infection which is good news. No fevers, chills, nausea, or vomiting noted at this time. 10/24/18 on evaluation today patient appears to be doing very well in regard to his lower extremities. His right lower extremity is shown signs of healing and his left lower Trinity though not healed appears to be improving which is excellent news. Overall very pleased with how things seem to be progressing at this time. The patient is likewise happy to hear this. His Velcro compression wraps however have not been put on properly will gonna show his wife how to do that properly today. 10/31/18 on evaluation today patient appears to be doing more poorly in regard to his left lower extremity in particular although both lower extremities actually are showing some signs of being worse than my previous evaluation. Unfortunately I'm just not sure that his compression stockings even with the use of the compression/lymphedema pumps seem to be controlling this well. Upon further questioning he tells me that he also is not able to lie flat in the bed  due to his congestive heart failure and difficulty breathing. For that reason he sleeps in his recliner. To make matters worse his recliner also cannot even hold  his legs up so instead of even being somewhat elevated they pretty much hang down to the floor. This is the way he sleeps each night which is definitely counterproductive to everything else that were attempting to do from the standpoint of controlling his fluid. Nonetheless I think that he potentially could benefit from a hospital bed although this would be something that his primary care provider would likely have to order since anything that is order on our side has to be directly related to wound care and again the hospital bed is not necessarily a direct relation to although I think it does contribute to his overall wound status on his lower extremities. 11/07/18 on evaluation today patient appears to be doing better in regard to his bilateral lower extremity. He's been tolerating the dressing changes without complication. We did in the interim since I last saw him switch to just using extras orbiting new alginate and that seems to have done much better for him. I'm very pleased with the overall progress that is made. 11/14/18 on evaluation today patient appears to be redoing rather well in regard to his left lower extremity ulcers which are the only ones remaining at this point. Fortunately there's no signs of active infection at this time which is good news. Overall been very pleased with how things seem to be progressing currently. No fevers, chills, nausea, or vomiting noted at this time. 11/21/18 on evaluation today patient actually appears to be doing quite well in regard to his lower extremities on the left on the right he has several blisters that showed up although there's some question about whether or not he has had his broker compression wraps on like he was supposed to or not. Fortunately there's no signs of active infection at this time which is RODRIGUEZ, AGUINALDO. ([id number]) good news. Unfortunately though he is doing well from the standpoint of the left leg the right leg is not doing as  well again this is when we did not wrap last week. Electronic Signature(s) Signed: 12/09/2018 10:21:35 AM By: Sharon Mt Signed: 12/10/2018 4:11:10 PM By: Worthy Keeler PA-C Previous Signature: 11/22/2018 1:16:38 PM Version By: Worthy Keeler PA-C Entered By: Sharon Mt on 12/09/2018 09:14:26 Adam Merritt ([id number]) -------------------------------------------------------------------------------- Physical Exam Details Patient Name: Adam Merritt Date of Service: 11/21/2018 2:15 PM Medical Record Number: [id number] Patient Account Number: [id number] Date of Birth/Sex: [birth year] (73 y.o. M) Treating RN: Army Melia Primary Care Provider: [name] Other Clinician: Referring Provider: [name] Treating Provider/Extender: STONE III, Faithlynn Deeley [name]: 53 Constitutional Well-nourished and well-hydrated in no acute distress. Respiratory normal breathing without difficulty. clear to auscultation bilaterally. Cardiovascular regular rate and rhythm with normal S1, S2. Psychiatric this patient is able to make decisions and demonstrates good insight into disease process. Alert and Oriented x 3. pleasant and cooperative. Notes Patient's wound is currently showed signs of pretty much complete resolution of has several new blisters on the right there are no open wounds at this time. Overall he is doing quite well in my pinion. We just need to do what we can to try to get the swelling control I'm really thinking that they [place] basic wraps that we got for him maybe better than the ones that he received from the [place]. Nonetheless we definitely need to check into  this. He's gonna bring those with him next time. Electronic Signature(s) Signed: 11/22/2018 1:16:38 PM By: Worthy Keeler PA-C Entered By: Worthy Keeler on 11/22/2018 13:06:06 Adam Merritt ([id number]) -------------------------------------------------------------------------------- Physician Orders Details Patient  Name: Adam Merritt Date of Service: 11/21/2018 2:15 PM Medical Record Number: [id number] Patient Account Number: [id number] Date of Birth/Sex: January 26, 1946 (73 y.o. M) Treating RN: Army Melia Primary Care Provider: [name] Other Clinician: Referring Provider: [name] Treating Provider/Extender: Melburn Hake, Tylon Kemmerling [name]: 55 Verbal / Phone Orders: No Diagnosis Coding Wound Cleansing Wound #22 Left,Circumferential Lower Leg o Cleanse wound with mild soap and water o May Shower, gently pat wound dry prior to applying new dressing. Skin Barriers/Peri-Wound Care Wound #22 Left,Circumferential Lower Leg o Barrier cream Primary Wound Dressing Wound #22 Left,Circumferential Lower Leg o Non-adherent pad Dressing Change Frequency Wound #22 Left,Circumferential Lower Leg o Dressing is to be changed Monday and Thursday. Follow-up Appointments Wound #22 Left,Circumferential Lower Leg o Return Appointment in 1 week. - Monday and Thursday o Nurse Visit as needed Edema Control Wound #22 Left,Circumferential Lower Leg o 4 Layer Compression System - Bilateral - apply barrier cream and non-adherent pad to any open areas o Elevate legs to the level of the heart and pump ankles as often as possible Additional Orders / Instructions Wound #22 Left,Circumferential Lower Leg o Other: - Talk with PCP about getting a hospital bed to sleep in. Electronic Signature(s) Signed: 11/21/2018 3:38:22 PM By: Army Melia Signed: 11/22/2018 1:16:38 PM By: Worthy Keeler PA-C Entered By: Army Melia on 11/21/2018 14:53:32 Adam Merritt ([id number]) -------------------------------------------------------------------------------- Problem List Details Patient Name: Adam Merritt Date of Service: 11/21/2018 2:15 PM Medical Record Number: [id number] Patient Account Number: [id number] Date of Birth/Sex: August 04, 1945 (73 y.o. 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 06/11/2018 No Yes exposed E66.9 Obesity, unspecified 04/09/2018 No Yes Inactive Problems Resolved Problems ICD-10 Code Description Active Date Resolved Date L03.115 Cellulitis of right lower limb 04/09/2018 04/09/2018 L03.116 Cellulitis of left lower limb 04/09/2018 04/09/2018 Electronic Signature(s) Signed: 11/22/2018 1:16:38 PM By: Worthy Keeler PA-C Entered By: Worthy Keeler on 11/21/2018 15:01:28 Adam Merritt ([id number]) CODEY, BURLING ([id number]) -------------------------------------------------------------------------------- Progress Note Details Patient Name: Adam Merritt Date of Service: 11/21/2018 2:15 PM Medical Record Number: [id number] Patient Account Number: [id number] Date of Birth/Sex: [birth year] (73 y.o. M) Treating RN: Army Melia Primary Care Provider: [name] Other Clinician: Referring Provider: [name] Treating Provider/Extender: Melburn Hake, Detria Cummings [name]: 86 Subjective Chief Complaint Information obtained from Patient he is here for evaluation of bilateral lower extremity lymphedema right right toe and left LE ulcers History of Present Illness (HPI) 10/18/17-He is here for initial evaluation of bilateral lower extremity ulcerations in the presence of venous insufficiency and lymphedema. He has been seen by vascular medicine in the past, Dr. Lucky Cowboy, last seen in 2016. He does have a  history of abnormal ABIs, which is to be expected given his lymphedema and venous insufficiency. According to  Epic, it appears that all attempts for arterial evaluation and/or angiography were not follow through with by patient. He does have a history of being seen in lymphedema clinic in 2018, stopped going approximately 6 months ago stating "it didn't do any good". He does not have lymphedema pumps, he does not have custom fit compression wrap/stockings. He is diabetic and his recent A1c last month was 7.6. He admits to chronic bilateral lower extremity pain, no change in pain since blister and ulceration development. He is currently being treated with Levaquin for bronchitis. He has home health and we will continue. 10/25/17-He is here in follow-up evaluation for bilateral lower extremity ulcerationssubtle he remains on Levaquin for bronchitis. Right lower extremity with no evidence of drainage or ulceration, persistent left lower extremity ulceration. He states that home health has not been out since his appointment. He went to [facility] on Tuesday, studies revealed: RIGHT ABI 0.9, TBI 0.6 LEFT ABI 1.1, TBI 0.6 with triphasic flow bilaterally. We will continue his same treatment plan. He has been educated on compression therapy and need for elevation. He will benefit from lymphedema pumps 11/01/17-He is here in follow-up evaluation for left lower extremity ulcer. The right lower extremity remains healed. He has home health services, but they have not been out to see the patient for 2-3 weeks. He states it home health physical therapy changed his dressing yesterday after therapy; he placed Ace wrap compression. We are still waiting for lymphedema pumps, reordered d/t need for company change. 11/08/17-He is here in follow-up evaluation for left lower extremity ulcer. It is improved. Edema is significantly improved with compression therapy. We will continue with same treatment plan and  he will follow-up next week. No word regarding lymphedema pumps 11/15/17-He is here in follow-up evaluation for left lower extremity ulcer. He is healed and will be discharged from wound care services. I have reached out to [facility] regarding his lymphedema pumps. They have been unable to reach the patient; the contact number they had with the patient's wife's cell phone and she has not answered any unrecognized calls. Contact should be made today, trial planned for next week; [facility] will continue to follow 11/27/17 on evaluation today patient has multiple blistered areas over the right lower extremity his left lower extremity appears to be doing okay. These blistered areas show signs of no infection which is great news. With that being said he did have some necrotic skin overlying which was mechanically debrided away with saline and gauze today without complication. Overall post debridement the wounds appear to be doing better but in general his swelling seems to be increased. This is obviously not good news. I think this is what has given rise to the blisters. 12/04/17 on evaluation today patient presents for follow-up concerning his bilateral lower extremity edema in the right lower extremity ulcers. He has been tolerating the dressing changes without complication. With that being said he has had no real issues with the wraps which is also good news. Overall I'm pleased with the progress he's been making. 12/11/17 on evaluation today patient appears to be doing rather well in regard to his right lateral lower extremity ulcer. He's been tolerating the dressing changes without complication. Fortunately there does not appear to be any evidence of infection at this time. Overall I'm pleased with the progress that is being made. Unfortunately he has been in the hospital due to having what sounds to be a stomach virus/flu fortunately that is starting  to get better. 12/18/17 on evaluation  today patient actually appears to be doing very well in regard to his bilateral lower extremities the EMMONS, TOTH. ([id number]) swelling is under fairly good control his lymphedema pumps are still not up and running quite yet. With that being said he does have several areas of opening noted as far as wounds are concerned mainly over the left lower extremity. With that being said I do believe once he gets lymphedema pumps this would least help you mention some the fluid and preventing this from occurring. Hopefully that will be set up soon sleeves are Artie in place at his home he just waiting for the machine. 12/25/17 on evaluation today patient actually appears to be doing excellent in fact all of his ulcers appear to have resolved his legs appear very well. I do think he needs compression stockings we have discussed this and they are actually going to go to [place] today to elastic therapy to get this fitted for him. I think that is definitely a good thing to do. Readmission: 04/09/18 upon evaluation today this patient is seen for readmission due to bilateral lower extremity lymphedema. He has significant swelling of his extremities especially on the left although the right is also swollen he has weeping from both sides. There are no obvious open wounds at this point. Fortunately he has been doing fairly well for quite a bit of time since I last saw him. Nonetheless unfortunately this seems to have reopened and is giving quite a bit of trouble. He states this began about a week ago when he first called [place] to get in to be seen. No fevers, chills, nausea, or vomiting noted at this time. He has not been using his lymphedema pumps due to the fact that they won't fit on his leg at this point likewise is also not been using his compression for essentially the same reason. 04/16/18 upon evaluation today patient actually appears to be doing a little better in regard to the fluid in his bilateral  lower extremities. With that being said he's had three falls since I saw him last week. He also states that he's been feeling very poorly. I was concerned last week and feel like that the concern is still there as far as the congestion in his chest is concerned he seems to be breathing about the same as last week but again he states he's very weak he's not even able to walk further than from the chair to the door. His wife had to buy a wheelchair just to be able to get them out of the house to get to the appointment today. This has me very concerned. 04/23/18 on evaluation today patient actually appears to be doing much better than last week's evaluation. At that time actually had to transport him to the ER via EMS and he subsequently was admitted for acute pulmonary edema, acute renal failure, and acute congestive heart failure. Fortunately he is doing much better. Apparently they did dialyze him and were able to take off roughly 35 pounds of fluid. Nonetheless he is feeling much better both in regard to his breathing and he's able to get around much better at this time compared to previous. Overall I'm very happy with how things are at this time. There does not appear to be any evidence of infection currently. No fevers, chills, nausea, or vomiting noted at this time. 04/30/2018 patient seen today for follow-up and management of bilateral lower extremity lymphedema. He  did express being more sad today than usual due to the recent loss of his dog. He states that he has been compliant with using the lymphedema pumps. However he does admit a minute over the last 2-3 days he has not been using the pumps due to the recent loss of his dog. At this time there is no drainage or open wounds to his lower extremities. The left leg edema is measuring smaller today. Still has a significant amount of edema on bilateral lower extremities With dry flaky skin. He will be referred to the lymphedema clinic for  further management. Will continue 3 layer compression wraps and follow-up in 1-2 weeks.Denies any pain, fever, chairs recently. No recent falls or injuries reported during this visit. 05/07/18 on evaluation today patient actually appears to be doing very well in regard to his lower extremities in general all things considered. With that being said he is having some pain in the legs just due to the amount of swelling. He does have an area where he had a blister on the left lateral lower extremity this is open at this point other than that there's nothing else weeping at this time. 05/14/18 on evaluation today patient actually appears to be doing excellent all things considered in regard to his lower extremities. He still has a couple areas of weeping on each leg which has continued to be the issue for him. He does have an appointment with the lymphedema clinic although this isn't until February 2020. That was the earliest they had. In the meantime he has continued to tolerate the compression wraps without complication. 05/28/18 on evaluation today patient actually appears to be doing more poorly in regard to his left lower extremity where he has a wound open at this point. He also had a fall where he subsequently injured his right great toe which has led to an open wound at the site unfortunately. He has been tolerating the dressing changes without complication in general as far as the wraps are concerned that he has not been putting any dressing on the left 1st toe ulcer site. 06/11/18 on evaluation today patient appears to be doing much worse in regard to his bilateral lower extremity ulcers. He has been tolerating the dressing changes without complication although his legs have not been wrapped more recently. Overall I am not very pleased with the way his legs appear. I do believe he needs to be back in compression wraps he still has not received his compression wraps from the [facility] hospital as of  yet. MARWIN, PRIMMER ([id number]) 06/18/18 on evaluation today patient actually appears to be doing significantly better than last time I saw him. He has been tolerating the compression wraps without complication in the circumferential ulcers especially appear to be doing much better. His toe ulcer on the right in regard to the great toe is better although not as good as the legs in my pinion. No fevers chills noted 07/02/18 on evaluation today patient appears to be doing much better in regard to his lower extremity ulcers. Unfortunately since I last saw him he's had the distal portion of his right great toe if he dated it sounds as if this actually went downhill very quickly. I had only seen him a few days prior and the toe did not appear to be infected at that point subsequently became infected very rapidly and it was decided by the surgeon that the distal portion of the toe needed to be removed. The patient  seems to be doing well in this regard he tells me. With that being said his lower extremities are doing better from the standpoint of the wounds although he is significantly swollen at this point. 07/09/18 on evaluation today patient appears to be doing better in regard to the wounds on his lower extremities. In fact everything is almost completely healed he is just a small area on the left posterior lower extremity that is open at this point. 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 With that being said he needs some kind of compression it does sound like he may not be wearing his compression, that is the wraps, during the entire time between when he's here visit to visit. Apparently his wife took the current one off because it began to "fall apart". 07/16/18 on evaluation today patient appears to be extremely  swollen especially in regard to his left lower extremity unfortunately. He also has a new skin tear over the left lower extremity and there's a smaller area on the right lower extremity as well. Unfortunately this seems to be due in part to blistering and fluid buildup in his leg. He did get the reduction wraps that were ordered by the [facility] hospital for him to go to lymphedema clinic. With that being said his wounds on the legs have not healed to the point to where they would likely accept them as a patient lymphedema clinic currently. We need to try to get this to heal. With that being said he's been taking his wraps off which is not doing him any favors at this point. In fact this is probably quite counterproductive compared to what needs to occur. We will likely need to increase to a four layer compression wrap and continue to also utilize elevation and he has to keep the wraps on not take them off as he's been doing currently hasn't had a wrap on since Saturday. 07/23/18 on evaluation today patient appears to be doing much better in regard to his bilateral lower extremities. In fact his left lower extremity which was the largest is actually 15 cm smaller today compared to what it was last time he was here in our clinic. This is obviously good news after just one week. Nonetheless the differences he actually kept the wraps on during the entire week this time. That's not typical for him. I do believe he understands a little bit better now the severity of the situation and why it's important for him to keep these wraps on. 07/30/18 on evaluation today patient actually appears to be doing rather well in regard to his lower extremities. His legs are much smaller than they have been in the past and he actually has only one very small rudder superficial region remaining that is not closed on the left lateral/posterior lower extremity even this is almost completely close. I do believe likely next week  he will be healed without any complications. I do think we need to continue the wraps however this seems to be beneficial for him. I also think it may be a good time for [place] to go ahead and see about getting the appointment with the lymphedema clinic which is supposed to be made for him in order to keep this moving along and hopefully get them into compression wraps that will in the end  help him to remain healed. 08/06/18 on evaluation today patient actually appears to be doing very well in regard as bilateral lower extremities. In fact his wounds appear to be completely healed at this time. 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 He is in the process of getting appointment with the lymphedema clinic we have made this referral were just waiting to hear back on the schedule time. We need to follow up on that today as well. 08/13/18 on evaluation today patient actually appears to be doing very well in regard to his bilateral lower extremities there are no open wounds at this point. We are gonna go ahead and see about ordering the Velcro compression wraps for him had a discussion with them about [place] doing it versus the [place] they feel like they can definitely afford going ahead and get the wraps themselves and they would prefer to try to avoid having to go to the lymphedema clinic if it all possible which I completely understand. As long as he has good compression I'm okay either way. 3/17//20 on evaluation today patient actually appears to be doing well in regard to his bilateral lower extremity ulcers. He has been tolerating the dressing changes without complication specifically the compression wraps. Overall is had no issues my fingers finding that I see at this point is that he is having trouble with constipation. He tells me he has not been able to go to the bathroom for about six days. He's taken to over-the-counter oral laxatives  unfortunately this is not helping. He has not contacted his doctor. DAT, DERKSEN ([id number]) 08/27/18 on evaluation today patient appears to be doing fairly well in regard to his lower extremities at this point. There does not appear to be any new altars is swelling is very well controlled. We are still waiting for his Velcro compression wraps to arrive that should be sometime in the next week is Artie sent the check for these. 09/03/18 on evaluation today patient appears to be doing excellent in regard to his bilateral lower extremities which he shows no signs of wound openings in regard to this point. He does have his Velcro compression wraps which did arrive in the mail since I saw him last week. Overall he is doing excellent in my pinion. 09/13/18 on evaluation today patient appears to be doing very well currently in regard to the overall appearance of his bilateral lower extremities although he's a little bit more swollen than last time we saw him. At that point he been discharged without any open wounds. Nonetheless he has a small open wound on the posterior left lower extremity with some evidence of cellulitis noted as well. Fortunately I feel like he has made good progress overall with regard to his lower extremities from were things used to be. 09/20/18 on evaluation today patient actually appears to be doing much better. The erythematous lower extremity is improving wound itself which is still open appears to be doing much better as far as but appearance as well as pain is concerned overall very pleased in this regard. There's no signs of active infection at this time. 09/27/18 on evaluation today patient's wounds on the lower extremity actually appear to be doing fairly well at this time which is good news. There is no evidence of active infection currently and again is just as left lower extremity were there any wounds at this point anyway. I believe they may be completely healed  but  again I'm not 100% sure based on evaluation today. I think one more week of observation would likely be a good idea. 10/04/18 on evaluation today patient actually appears to be doing excellent in regard to the left lower extremity which actually appears to be completely healed as of today. Unfortunately he's been having issues with his right lower extremity have a new wound that has opened. Fortunately there's no evidence of active infection at this time which is good news. 10/10/18 upon evaluation today patient actually appears to be doing a little bit worse with the new open area on his right posterior lower extremity. He's been tolerating the dressing changes without complication. Right now we been using his compression wraps although I think we may need to switch back to actually performing bilateral compression wraps are in the clinic. No fevers, chills, nausea, or vomiting noted at this time. 10/17/18 on evaluation today patient actually appears to be doing quite well in regard to his bilateral lower extremity ulcers. He has been tolerating the reps without complication although he would prefer not to rewrap his legs as of today. Fortunately there's no signs of active infection which is good news. No fevers, chills, nausea, or vomiting noted at this time. 10/24/18 on evaluation today patient appears to be doing very well in regard to his lower extremities. His right lower extremity is shown signs of healing and his left lower [place] though not healed appears to be improving which is excellent news. Overall very pleased with how things seem to be progressing at this time. The patient is likewise happy to hear this. His Velcro compression wraps however have not been put on properly will gonna show his wife how to do that properly today. 10/31/18 on evaluation today patient appears to be doing more poorly in regard to his left lower extremity in particular although both lower extremities actually are  showing some signs of being worse than my previous evaluation. Unfortunately I'm just not sure that his compression stockings even with the use of the compression/lymphedema pumps seem to be controlling this well. Upon further questioning he tells me that he also is not able to lie flat in the bed due to his congestive heart failure and difficulty breathing. For that reason he sleeps in his recliner. To make matters worse his recliner also cannot even hold his legs up so instead of even being somewhat elevated they pretty much hang down to the floor. This is the way he sleeps each night which is definitely counterproductive to everything else that were attempting to do from the standpoint of controlling his fluid. Nonetheless I think that he potentially could benefit from a hospital bed although this would be something that his primary care provider would likely have to order since anything that is order on our side has to be directly related to wound care and again the hospital bed is not necessarily a direct relation to although I think it does contribute to his overall wound status on his lower extremities. 11/07/18 on evaluation today patient appears to be doing better in regard to his bilateral lower extremity. He's been tolerating the dressing changes without complication. We did in the interim since I last saw him switch to just using extras orbiting new alginate and that seems to have done much better for him. I'm very pleased with the overall progress that is made. 11/14/18 on evaluation today patient appears to be redoing rather well in regard to his left lower extremity  ulcers which are the only ones remaining at this point. Fortunately there's no signs of active infection at this time which is good news. Overall been ALBERT, HERSCH ([id number]) very pleased with how things seem to be progressing currently. No fevers, chills, nausea, or vomiting noted at this time. 11/21/18 on evaluation  today patient actually appears to be doing quite well in regard to his lower extremities on the left on the right he has several blisters that showed up although there's some question about whether or not he has had his broker compression wraps on like he was supposed to or not. Fortunately there's no signs of active infection at this time which is good news. Unfortunately though he is doing well from the standpoint of the left leg the right leg is not doing as well again this is when we did not wrap last week. Patient History Information obtained from Patient. Family History Cancer - Paternal Grandparents, Kidney Disease - Siblings, Lung Disease - Mother, No family history of Diabetes, Heart Disease, Hereditary Spherocytosis, Hypertension, Seizures, Stroke, Thyroid Problems, Tuberculosis. Social History Never smoker, Marital Status - Married, Alcohol Use - Never, Drug Use - No History, Caffeine Use - Never. Medical History Eyes Denies history of Cataracts, Glaucoma, Optic Neuritis Ear/Nose/Mouth/Throat Denies history of Chronic sinus problems/congestion, Middle ear problems Hematologic/Lymphatic Patient has history of Anemia - aplastic anemia, Lymphedema Denies history of Hemophilia, Human Immunodeficiency Virus, Sickle Cell Disease Respiratory Patient has history of Chronic Obstructive Pulmonary Disease (COPD), Sleep Apnea Denies history of Aspiration, Asthma, Pneumothorax, Tuberculosis Cardiovascular Patient has history of Congestive Heart Failure, Coronary Artery Disease, Hypertension, Peripheral Venous Disease Denies history of Angina, Arrhythmia, Deep Vein Thrombosis, Hypotension, Myocardial Infarction, Peripheral Arterial Disease, Phlebitis, Vasculitis Gastrointestinal Denies history of Cirrhosis , Colitis, Crohn s, Hepatitis A, Hepatitis B, Hepatitis C Endocrine Patient has history of Type II Diabetes Denies history of Type I Diabetes Genitourinary Denies history of End  Stage Renal Disease Immunological Denies history of Lupus Erythematosus, Raynaud s, Scleroderma Integumentary (Skin) Denies history of History of Burn, History of pressure wounds Musculoskeletal Patient has history of Osteoarthritis Denies history of Gout, Rheumatoid Arthritis, Osteomyelitis Neurologic Patient has history of Neuropathy Denies history of Dementia, Quadriplegia, Paraplegia, Seizure Disorder Oncologic Patient has history of Received Chemotherapy - last dose 2006 Denies history of Received Radiation Psychiatric Denies history of Anorexia/bulimia, Confinement Anxiety Hospitalization/Surgery History - bronchitis. HAMMOND, OBEIRNE ([id number]) Medical And Surgical History Notes Respiratory home O2 at night as needed Cardiovascular varicose veins Neurologic CVA - 1992 Review of Systems (ROS) Constitutional Symptoms (General Health) Denies complaints or symptoms of Fatigue, Fever, Chills, Marked Weight Change. Respiratory Denies complaints or symptoms of Chronic or frequent coughs, Shortness of Breath. Cardiovascular Complains or has symptoms of LE edema. Denies complaints or symptoms of Chest pain. Psychiatric Denies complaints or symptoms of Anxiety, Claustrophobia. Objective Constitutional Well-nourished and well-hydrated in no acute distress. Vitals Time Taken: 2:49 PM, Height: 66 in, Weight: 323 lbs, BMI: 52.1, Temperature: 98.3 F, Pulse: 66 bpm, Respiratory Rate: 18 breaths/min, Blood Pressure: 145/68 mmHg. Respiratory normal breathing without difficulty. clear to auscultation bilaterally. Cardiovascular regular rate and rhythm with normal S1, S2. Psychiatric this patient is able to make decisions and demonstrates good insight into disease process. Alert and Oriented x 3. pleasant and cooperative. General Notes: Patient's wound is currently showed signs of pretty much complete resolution of has several new blisters on the right there are no open wounds at  this time. Overall he is doing quite well  in my pinion. We just need to do what we can to try to get the swelling control I'm really thinking that they [place] basic wraps that we got for him maybe better than the ones that he received from the [place]. Nonetheless we definitely need to check into this. He's gonna bring those with him next time. 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 Post procedure Diagnosis Wound #: Same as Pre-Procedure Plan Wound Cleansing: Wound #22 Left,Circumferential Lower Leg: Cleanse wound with mild soap and water May Shower, gently pat wound dry prior to applying new dressing. Skin Barriers/Peri-Wound Care: Wound #22 Left,Circumferential Lower Leg: Barrier cream Primary Wound Dressing: Wound #22 Left,Circumferential Lower Leg: Non-adherent pad Dressing Change Frequency: Wound #22 Left,Circumferential Lower Leg: Dressing is to be changed Monday and Thursday. Follow-up Appointments: Wound #22 Left,Circumferential Lower Leg: Return Appointment in 1 week. - Monday and Thursday Nurse Visit as needed Edema Control: Wound #22 Left,Circumferential Lower Leg: 4 Layer Compression System - Bilateral - apply barrier cream and non-adherent pad to any open areas Elevate legs to the level of the heart and pump ankles as often as possible Additional Orders / Instructions: Wound #22 Left,Circumferential Lower Leg: Other: - Talk with PCP about getting a hospital bed to sleep in. My suggestion at this point is gonna be that we continue with the  above wound care measures for the next week the patient is in agreement with plan. Will subtly see were things stand at follow-up. If anything changes worsens meantime he will contact the office and let me know. ARMAAN, POND ([id number]) Please see above for specific wound care orders. We will see patient for re-evaluation in 1 week(s) here in the clinic. If anything worsens or changes patient will contact our office for additional recommendations. I do believe that the [place] basic wraps that we got for him maybe better than the other wraps that he received from the [place]. They adjust more specifically to his lagan with his unusual shape to leg I think this would be much better for him. We'll see were things stand. Electronic Signature(s) Signed: 12/19/2018 11:33:52 PM By: Worthy Keeler PA-C Previous Signature: 11/22/2018 1:16:38 PM Version By: Worthy Keeler PA-C Entered By: Worthy Keeler on 12/19/2018 23:07:52 Adam Merritt ([id number]) -------------------------------------------------------------------------------- ROS/PFSH Details Patient Name: Adam Merritt Date of Service: 11/21/2018 2:15 PM Medical Record Number: [id number] Patient Account Number: [id number] Date of Birth/Sex: 04/07/1946 (73 y.o. M) Treating RN: Army Melia Primary Care Provider: [name] Other Clinician: Referring Provider: [name] Treating Provider/Extender: Melburn Hake, Mellanie Bejarano [name]: 47 Information Obtained From Patient Constitutional Symptoms (General Health) Complaints and Symptoms: Negative for: Fatigue; Fever; Chills; Marked Weight Change Respiratory Complaints and Symptoms: Negative for: Chronic or frequent coughs; Shortness of Breath Medical History: Positive for: Chronic Obstructive Pulmonary Disease (COPD); Sleep Apnea Negative for: Aspiration; Asthma; Pneumothorax; Tuberculosis Past Medical History Notes: home O2 at night as needed Cardiovascular Complaints and  Symptoms: Positive for: LE edema Negative for: Chest pain Medical History: Positive for: Congestive Heart Failure; Coronary Artery Disease; Hypertension; Peripheral Venous Disease Negative for: Angina; Arrhythmia; Deep  Vein Thrombosis; Hypotension; Myocardial Infarction; Peripheral Arterial Disease; Phlebitis; Vasculitis Past Medical History Notes: varicose veins Psychiatric Complaints and Symptoms: Negative for: Anxiety; Claustrophobia Medical History: Negative for: Anorexia/bulimia; Confinement Anxiety Eyes Medical History: Negative for: Cataracts; Glaucoma; Optic Neuritis Ear/Nose/Mouth/Throat Medical History: Negative for: Chronic sinus problems/congestion; Middle ear problems QUANDARIUS, NILL ([id number]) Hematologic/Lymphatic Medical History: Positive for: Anemia - aplastic anemia; Lymphedema Negative for: Hemophilia; Human Immunodeficiency Virus; Sickle Cell Disease Gastrointestinal Medical History: Negative for: Cirrhosis ; Colitis; Crohnos; Hepatitis A; Hepatitis B; Hepatitis C Endocrine Medical History: Positive for: Type II Diabetes Negative for: Type I Diabetes Time with diabetes: since 2006 Treated with: Insulin Blood sugar tested every day: Yes Tested : Blood sugar testing results: Breakfast: 209 Genitourinary Medical History: Negative for: End Stage Renal Disease Immunological Medical History: Negative for: Lupus Erythematosus; Raynaudos; Scleroderma Integumentary (Skin) Medical History: Negative for: History of Burn; History of pressure wounds Musculoskeletal Medical History: Positive for: Osteoarthritis Negative for: Gout; Rheumatoid Arthritis; Osteomyelitis Neurologic Medical History: Positive for: Neuropathy Negative for: Dementia; Quadriplegia; Paraplegia; Seizure Disorder Past Medical History Notes: CVA - 1992 Oncologic Medical History: Positive for: Received Chemotherapy - last dose 2006 Negative for: Received  Radiation Immunizations Pneumococcal Vaccine: JERSON, FURUKAWA ([id number]) Received Pneumococcal Vaccination: Yes Implantable Devices No devices added Hospitalization / Surgery History Type of Hospitalization/Surgery bronchitis Family and Social History Cancer: Yes - Paternal Grandparents; Diabetes: No; Heart Disease: No; Hereditary Spherocytosis: No; Hypertension: No; Kidney Disease: Yes - Siblings; Lung Disease: Yes - Mother; Seizures: No; Stroke: No; Thyroid Problems: No; Tuberculosis: No; Never smoker; Marital Status - Married; Alcohol Use: Never; Drug Use: No History; Caffeine Use: Never; Financial Concerns: No; Food, Clothing or Shelter Needs: No; Support System Lacking: No; Transportation Concerns: No Physician Affirmation I have reviewed and agree with the above information. Electronic Signature(s) Signed: 11/22/2018 1:16:38 PM By: Worthy Keeler PA-C Signed: 11/22/2018 4:22:16 PM By: Army Melia Entered By: Worthy Keeler on 11/22/2018 13:05:48 Adam Merritt ([id number]) -------------------------------------------------------------------------------- SuperBill Details Patient Name: Adam Merritt Date of Service: 11/21/2018 Medical Record Number: [id number] Patient Account Number: [id number] Date of Birth/Sex: 1945/11/05 (73 y.o. M) Treating RN: Army Melia Primary Care Provider: [name] Other Clinician: Referring Provider: [name] Treating Provider/Extender: Melburn Hake, Allyn Bartelson [name]: 32 Diagnosis Coding ICD-10 Codes Code Description I89.0 Lymphedema, not elsewhere classified L97.512 Non-pressure chronic ulcer of other part of right foot with fat layer exposed L97.822 Non-pressure chronic ulcer of other part of left lower leg with fat layer exposed L98.492 Non-pressure chronic ulcer of skin of other sites with fat layer exposed E66.9 Obesity, unspecified Facility Procedures CPT4: Description Modifier Quantity Code [id number] BILATERAL:  Application of multi-layer venous compression system; leg (below 1 knee), including ankle and foot. Physician Procedures CPT4 Code Description: 1660630 [id number] - WC PHYS LEVEL 4 - EST PT ICD-10 Diagnosis Description I89.0 Lymphedema, not elsewhere classified L97.512 Non-pressure chronic ulcer of other part of right foot with fat L97.822 Non-pressure chronic ulcer of other  part of left lower leg with L98.492 Non-pressure chronic ulcer of skin of other sites with fat layer Modifier: layer exposed fat layer expos exposed Quantity: 1 ed Electronic Signature(s) Signed: 11/22/2018 2:28:49 PM By: Army Melia Signed: 11/22/2018 5:06:58 PM By: Worthy Keeler PA-C Previous Signature: 11/22/2018 1:16:38 PM Version By: Worthy Keeler PA-C Entered By: Army Melia on 11/22/2018 14:28:49

## 2018-11-22 NOTE — Progress Notes (Signed)
CHUCKY, HOMES ([id number]) Visit Report for 11/21/2018 Arrival Information Details Patient Name: Adam Merritt, Adam Merritt Date of Service: 11/21/2018 2:15 PM Medical Record Number: [id number] Patient Account Number: [id number] Date of Birth/Sex: 1946/01/10 (73 y.o. M) Treating RN: Cornell Barman Primary Care Emmette Katt: [name] Other Clinician: Referring Shawanna Zanders: [name] Treating Risa Auman/Extender: Melburn Hake, [name]: 51 Visit Information History Since Last Visit Added or deleted any medications: No Patient Arrived: Walker Any new allergies or adverse reactions: No Arrival Time: 14:26 Had a fall or experienced change in No Accompanied By: self activities of daily living that may affect Transfer Assistance: None risk of falls: Patient Identification Verified: Yes Signs or symptoms of abuse/neglect since last visito No Secondary Verification Process Yes Hospitalized since last visit: No Completed: Has Dressing in Place as Prescribed: Yes Patient Has Alerts: Yes Pain Present Now: No Patient Alerts: Patient on Blood Thinner Aspirin 325mg  Type II Diabetes ABI 5/19 L 1.1 R 0.9 TBI L 0.6 R 0.6 Electronic Signature(s) Signed: 11/21/2018 4:58:07 PM By: Gretta Cool, BSN, RN, CWS, Kim RN, BSN Entered By: Gretta Cool, BSN, RN, CWS, Kim on 11/21/2018 14:27:05 Adam Merritt ([id number]) -------------------------------------------------------------------------------- Clinic Level of Care Assessment Details Patient Name: Adam Merritt Date of Service: 11/21/2018 2:15 PM Medical Record Number: [id number] Patient Account Number: [id number] Date of Birth/Sex: [birth year] (73 y.o. M) Treating RN: Army Melia Primary Care Mayana Irigoyen: [name] Other Clinician: Referring Schylar Allard: [name] Treating Jaiden Wahab/Extender: Melburn Hake, [name]: 32 Clinic Level of Care Assessment Items TOOL 4 Quantity Score []  - Use when only an EandM is performed on FOLLOW-UP visit 0 ASSESSMENTS  - Nursing Assessment / Reassessment X - Reassessment of Co-morbidities (includes updates in patient status) 1 10 X- 1 5 Reassessment of Adherence to Treatment Plan ASSESSMENTS - Wound and Skin Assessment / Reassessment X - Simple Wound Assessment / Reassessment - one wound 1 5 []  - 0 Complex Wound Assessment / Reassessment - multiple wounds []  - 0 Dermatologic / Skin Assessment (not related to wound area) ASSESSMENTS - Focused Assessment []  - Circumferential Edema Measurements - multi extremities 0 []  - 0 Nutritional Assessment / Counseling / Intervention []  - 0 Lower Extremity Assessment (monofilament, tuning fork, pulses) []  - 0 Peripheral Arterial Disease Assessment (using hand held doppler) ASSESSMENTS - Ostomy and/or Continence Assessment and Care []  - Incontinence Assessment and Management 0 []  - 0 Ostomy Care Assessment and Management (repouching, etc.) PROCESS - Coordination of Care X - Simple Patient / Family Education for ongoing care 1 15 []  - 0 Complex (extensive) Patient / Family Education for ongoing care []  - 0 Staff obtains Programmer, systems, Records, Test Results / Process Orders []  - 0 Staff telephones HHA, Nursing Homes / Clarify orders / etc []  - 0 Routine Transfer to another Facility (non-emergent condition) []  - 0 Routine Hospital Admission (non-emergent condition) []  - 0 New Admissions / Biomedical engineer / Ordering NPWT, Apligraf, etc. []  - 0 Emergency Hospital Admission (emergent condition) X- 1 10 Simple Discharge Coordination ROBIE, MCNIEL ([id number]) []  - 0 Complex (extensive) Discharge Coordination PROCESS - Special Needs []  - Pediatric / Minor Patient Management 0 []  - 0 Isolation Patient Management []  - 0 Hearing / Language / Visual special needs []  - 0 Assessment of Community assistance (transportation, D/C planning, etc.) []  - 0 Additional assistance / Altered mentation []  - 0 Support Surface(s) Assessment (bed, cushion, seat,  etc.) INTERVENTIONS - Wound Cleansing / Measurement X - Simple Wound Cleansing - one wound 1  5 []  - 0 Complex Wound Cleansing - multiple wounds X- 1 5 Wound Imaging (photographs - any number of wounds) []  - 0 Wound Tracing (instead of photographs) X- 1 5 Simple Wound Measurement - one wound []  - 0 Complex Wound Measurement - multiple wounds INTERVENTIONS - Wound Dressings X - Small Wound Dressing one or multiple wounds 1 10 []  - 0 Medium Wound Dressing one or multiple wounds []  - 0 Large Wound Dressing one or multiple wounds []  - 0 Application of Medications - topical []  - 0 Application of Medications - injection INTERVENTIONS - Miscellaneous []  - External ear exam 0 []  - 0 Specimen Collection (cultures, biopsies, blood, body fluids, etc.) []  - 0 Specimen(s) / Culture(s) sent or taken to Lab for analysis []  - 0 Patient Transfer (multiple staff / Civil Service fast streamer / Similar devices) []  - 0 Simple Staple / Suture removal (25 or less) []  - 0 Complex Staple / Suture removal (26 or more) []  - 0 Hypo / Hyperglycemic Management (close monitor of Blood Glucose) []  - 0 Ankle / Brachial Index (ABI) - do not check if billed separately X- 1 5 Vital Signs [place], SHELLHAMMER ([id number]) Has the patient been seen at the hospital within the last three years: Yes Total Score: 75 Level Of Care: New/Established - Level 2 Electronic Signature(s) Signed: 11/21/2018 3:38:22 PM By: Army Melia Entered By: Army Melia on 11/21/2018 14:54:26 Adam Merritt ([id number]) -------------------------------------------------------------------------------- Compression Therapy Details Patient Name: Adam Merritt Date of Service: 11/21/2018 2:15 PM Medical Record Number: [id number] Patient Account Number: [id number] Date of Birth/Sex: 03-04-1946 (73 y.o. M) Treating RN: Army Melia Primary Care Ezel Vallone: [name] Other Clinician: Referring Marquinn Meschke: [name] Treating Caitlynn Ju/Extender: STONE III,  [name]: 32 Compression Therapy Performed for Wound Assessment: NonWound Condition Lymphedema - Bilateral Leg Performed By: Clinician Army Melia, RN Compression Type: Four Layer Pre Treatment ABI: 1.1 Post Procedure Diagnosis Same as Pre-procedure Electronic Signature(s) Signed: 11/21/2018 3:38:22 PM By: Army Melia Entered By: Army Melia on 11/21/2018 14:50:58 Adam Merritt ([id number]) -------------------------------------------------------------------------------- Encounter Discharge Information Details Patient Name: Adam Merritt Date of Service: 11/21/2018 2:15 PM Medical Record Number: [id number] Patient Account Number: [id number] Date of Birth/Sex: 1945-08-28 (73 y.o. M) Treating RN: Cornell Barman Primary Care Rudie Sermons: [name] Other Clinician: Referring Solara Goodchild: [name] Treating Eilan Mcinerny/Extender: Melburn Hake, [name]: 52 Encounter Discharge Information Items Discharge Condition: Stable Ambulatory Status: Walker Discharge Destination: Home Transportation: Private Auto Accompanied By: self Schedule Follow-up Appointment: Yes Clinical Summary of Care: Electronic Signature(s) Signed: 11/21/2018 4:58:07 PM By: Gretta Cool, BSN, RN, CWS, Kim RN, BSN Entered By: Gretta Cool, BSN, RN, CWS, Kim on 11/21/2018 15:22:31 Adam Merritt ([id number]) -------------------------------------------------------------------------------- Lower Extremity Assessment Details Patient Name: Adam Merritt Date of Service: 11/21/2018 2:15 PM Medical Record Number: [id number] Patient Account Number: [id number] Date of Birth/Sex: 01/04/1946 (73 y.o. M) Treating RN: Cornell Barman Primary Care Welby Montminy: [name] Other Clinician: Referring Yvette Roark: [name] Treating Dellie Piasecki/Extender: Melburn Hake, [name]: 32 Edema Assessment Assessed: [Left: No] [Right: No] [Left: Edema] [Right: :] Calf Left: Right: Point of Measurement: 37 cm From Medial Instep  54 cm 54.5 cm Ankle Left: Right: Point of Measurement: 12 cm From Medial Instep 33.5 cm 33 cm Vascular Assessment Pulses: Dorsalis Pedis Palpable: [Left:Yes] [Right:Yes] Electronic Signature(s) Signed: 11/21/2018 4:58:07 PM By: Gretta Cool, BSN, RN, CWS, Kim RN, BSN Entered By: Gretta Cool, BSN, RN, CWS, Kim on 11/21/2018 14:35:34 Adam Merritt ([id number]) -------------------------------------------------------------------------------- Multi Wound Chart  Details Patient Name: [place], NOYCE Date of Service: 11/21/2018 2:15 PM Medical Record Number: [id number] Patient Account Number: [id number] Date of Birth/Sex: [birth year] (73 y.o. M) Treating RN: Army Melia Primary Care Sakura Denis: [name] Other Clinician: Referring Everson Mott: [name] Treating Jilleen Essner/Extender: Melburn Hake, [name]: 32 Vital Signs Height(in): 66 Pulse(bpm): 66 Weight(lbs): 323 Blood Pressure(mmHg): 145/68 Body Mass Index(BMI): 52 Temperature(F): 98.3 Respiratory Rate 18 (breaths/min): Wound Assessments Treatment Notes Electronic Signature(s) Signed: 11/21/2018 3:38:22 PM By: Army Melia Entered By: Army Melia on 11/21/2018 14:49:22 Adam Merritt ([id number]) -------------------------------------------------------------------------------- [place] Details Patient Name: Adam Merritt Date of Service: 11/21/2018 2:15 PM Medical Record Number: [id number] Patient Account Number: [id number] Date of Birth/Sex: November 05, 1945 (73 y.o. M) Treating RN: Army Melia Primary Care Denishia Citro: [name] Other Clinician: Referring [name] Coppa: [name] Treating Youlanda Tomassetti/Extender: Melburn Hake, [name]: 50 Active Inactive Abuse / Safety / Falls / Self Care Management Nursing Diagnoses: Potential for falls Goals: Patient will remain injury free related to falls Date Initiated: 04/09/2018 Target Resolution Date: 10/05/2018 Goal Status: Active Interventions: Assess fall  risk on admission and as needed Notes: Venous Leg Ulcer Nursing Diagnoses: Actual venous Insuffiency (use after diagnosis is confirmed) Goals: Patient will maintain optimal edema control Date Initiated: 04/09/2018 Target Resolution Date: 10/05/2018 Goal Status: Active Interventions: Assess peripheral edema status every visit. Compression as ordered Provide education on venous insufficiency Notes: Electronic Signature(s) Signed: 11/21/2018 3:38:22 PM By: Army Melia Entered By: Army Melia on 11/21/2018 14:49:09 Adam Merritt ([id number]) -------------------------------------------------------------------------------- Pain Assessment Details Patient Name: Adam Merritt Date of Service: 11/21/2018 2:15 PM Medical Record Number: [id number] Patient Account Number: [id number] Date of Birth/Sex: 01/17/1946 (73 y.o. M) Treating RN: Cornell Barman Primary Care Juliah Scadden: [name] Other Clinician: Referring Aulden Calise: [name] Treating Merrit Friesen/Extender: Melburn Hake, [name]: 64 Active Problems Location of Pain Severity and Description of Pain Patient Has Paino No Site Locations Pain Management and Medication Current Pain Management: Electronic Signature(s) Signed: 11/21/2018 4:58:07 PM By: Gretta Cool, BSN, RN, CWS, Kim RN, BSN Entered By: Gretta Cool, BSN, RN, CWS, Kim on 11/21/2018 14:27:12 Adam Merritt ([id number]) -------------------------------------------------------------------------------- Patient/Caregiver Education Details Patient Name: Adam Merritt Date of Service: 11/21/2018 2:15 PM Medical Record Number: [id number] Patient Account Number: [id number] Date of Birth/Gender: 12/17/1945 (73 y.o. M) Treating RN: Army Melia Primary Care Physician: [name] Other Clinician: Referring Physician: [name] Treating Physician/Extender: Sharalyn Ink in Treatment: 43 Education Assessment Education Provided To: Patient Education Topics  Provided Wound/Skin Impairment: Handouts: Caring for Your Ulcer Methods: Demonstration, Explain/Verbal Responses: State content correctly Electronic Signature(s) Signed: 11/21/2018 3:38:22 PM By: Army Melia Entered By: Army Melia on 11/21/2018 14:54:42 Adam Merritt ([id number]) -------------------------------------------------------------------------------- [place] Details Patient Name: Adam Merritt Date of Service: 11/21/2018 2:15 PM Medical Record Number: [id number] Patient Account Number: [id number] Date of Birth/Sex: 07-28-1945 (73 y.o. M) Treating RN: Cornell Barman Primary Care Mattisyn Cardona: [name] Other Clinician: Referring Adonus Uselman: [name] Treating Logan Vegh/Extender: Melburn Hake, [name]: 32 Vital Signs Time Taken: 14:49 Temperature (F): 98.3 Height (in): 66 Pulse (bpm): 66 Weight (lbs): 323 Respiratory Rate (breaths/min): 18 Body Mass Index (BMI): 52.1 Blood Pressure (mmHg): 145/68 Reference Range: 80 - 120 mg / dl Electronic Signature(s) Signed: 11/21/2018 4:58:07 PM By: Gretta Cool, BSN, RN, CWS, Kim RN, BSN Entered By: Gretta Cool, BSN, RN, CWS, Kim on 11/21/2018 14:32:19

## 2018-11-25 ENCOUNTER — Other Ambulatory Visit: Payer: Self-pay

## 2018-11-25 DIAGNOSIS — E11621 Type 2 diabetes mellitus with foot ulcer: Secondary | ICD-10-CM | POA: Diagnosis not present

## 2018-11-26 ENCOUNTER — Other Ambulatory Visit: Payer: Self-pay

## 2018-11-26 DIAGNOSIS — E1165 Type 2 diabetes mellitus with hyperglycemia: Secondary | ICD-10-CM

## 2018-11-26 MED ORDER — BASAGLAR KWIKPEN 100 UNIT/ML ~~LOC~~ SOPN: PEN_INJECTOR | SUBCUTANEOUS | 0 refills | Status: DC

## 2018-11-26 NOTE — Progress Notes (Signed)
Adam Merritt ([id number]) Visit Report for 11/25/2018 Arrival Information Details Patient Name: Adam Merritt, Adam Merritt Date of Service: 11/25/2018 10:30 AM Medical Record Number: [id number] Patient Account Number: [id number] Date of Birth/Sex: 08/01/1945 (73 y.o. M) Treating RN: Army Melia Primary Care Liesa Tsan: [name] Other Clinician: Referring Seona Clemenson: [name] Treating Rudy Domek/Extender: Melburn Hake, [name]: 47 Visit Information History Since Last Visit Added or deleted any medications: No Patient Arrived: Walker Any new allergies or adverse reactions: No Arrival Time: 10:43 Had a fall or experienced change in No Accompanied By: self activities of daily living that may affect Transfer Assistance: None risk of falls: Patient Has Alerts: Yes Signs or symptoms of abuse/neglect since last visito No Patient Alerts: Patient on Blood Thinner Hospitalized since last visit: No Aspirin 325mg  Has Dressing in Place as Prescribed: Yes Type II Diabetes ABI 5/19 L 1.1 R 0.9 Pain Present Now: No TBI L 0.6 R 0.6 Electronic Signature(s) Signed: 11/25/2018 10:43:49 AM By: Army Melia Entered By: Army Melia on 11/25/2018 10:43:48 Adam Merritt ([id number]) -------------------------------------------------------------------------------- Compression Therapy Details Patient Name: Adam Merritt Date of Service: 11/25/2018 10:30 AM Medical Record Number: [id number] Patient Account Number: [id number] Date of Birth/Sex: 09-Sep-1945 (73 y.o. M) Treating RN: Army Melia Primary Care Kristie Bracewell: [name] Other Clinician: Referring Myana Schlup: [name] Treating Kalleigh [place]/Extender: Melburn Hake, [name]: 32 Compression Therapy Performed for Wound Assessment: Wound #22 Left,Circumferential Lower Leg Performed By: Clinician Army Melia, RN Compression Type: Four Layer Pre Treatment ABI: 0.9 Electronic Signature(s) Signed: 11/25/2018 10:45:14 AM By: Army Melia Entered By: Army Melia on 11/25/2018 10:45:14 Adam Merritt ([id number]) -------------------------------------------------------------------------------- Encounter Discharge Information Details Patient Name: Adam Merritt Date of Service: 11/25/2018 10:30 AM Medical Record Number: [id number] Patient Account Number: [id number] Date of Birth/Sex: Oct 02, 1945 (73 y.o. M) Treating RN: Army Melia Primary Care Beverly Suriano: [name] Other Clinician: Referring Jini Horiuchi: [name] Treating Kimsey Demaree/Extender: Melburn Hake, [name]: 74 Encounter Discharge Information Items Discharge Condition: Stable Ambulatory Status: Walker Discharge Destination: Home Transportation: Other Accompanied By: self Schedule Follow-up Appointment: Yes Clinical Summary of Care: Electronic Signature(s) Signed: 11/25/2018 10:46:43 AM By: Army Melia Entered By: Army Melia on 11/25/2018 10:46:43 Adam Merritt ([id number]) -------------------------------------------------------------------------------- Wound Assessment Details Patient Name: Adam Merritt Date of Service: 11/25/2018 10:30 AM Medical Record Number: [id number] Patient Account Number: [id number] Date of Birth/Sex: 03-Oct-1945 (73 y.o. M) Treating RN: Army Melia Primary Care Linell Meldrum: [name] Other Clinician: Referring Eliberto Sole: [name] Treating Rodel Glaspy/Extender: STONE III, [name]: 32 Wound Status Wound Number: 22 Primary Lymphedema Etiology: Wound Location: Left Lower Leg - Circumferential Wound Open Wounding Event: Gradually Appeared Status: Date Acquired: 10/25/2018 Comorbid Anemia, Lymphedema, Chronic Obstructive Weeks Of Treatment: 3 History: Pulmonary Disease (COPD), Sleep Apnea, Clustered Wound: No Congestive Heart Failure, Coronary Artery Disease, Hypertension, Peripheral Venous Disease, Type II Diabetes, Osteoarthritis, Neuropathy, Received Chemotherapy Wound  Measurements Length: (cm) 2 Width: (cm) 20 Depth: (cm) 0.1 Area: (cm) 31.416 Volume: (cm) 3.142 % Reduction in Area: 78.3% % Reduction in Volume: 78.3% Epithelialization: None Tunneling: No Undermining: No Wound Description Classification: Partial Thickness Exudate Amount: Large Exudate Type: Serous Exudate Color: amber Foul Odor After Cleansing: No Slough/Fibrino Yes Wound Bed Granulation Amount: Medium (34-66%) Exposed Structure Granulation Quality: Red Fascia Exposed: No Necrotic Amount: Small (1-33%) Fat Layer (Subcutaneous Tissue) Exposed: No Necrotic Quality: Adherent Slough Tendon Exposed: No Muscle Exposed: No Joint Exposed: No Bone Exposed: No Limited to Skin Breakdown Treatment Notes Wound #22 (Left,  Circumferential Lower Leg) Notes non-adhesive pad, ABD, 4 layer Electronic Signature(s) Signed: 11/25/2018 10:44:43 AM By: Army Melia Entered By: Army Melia on 11/25/2018 10:44:43

## 2018-11-28 ENCOUNTER — Other Ambulatory Visit: Payer: Self-pay

## 2018-11-28 ENCOUNTER — Encounter: Payer: Medicare Other | Admitting: Physician Assistant

## 2018-11-28 DIAGNOSIS — E11621 Type 2 diabetes mellitus with foot ulcer: Secondary | ICD-10-CM | POA: Diagnosis not present

## 2018-12-01 NOTE — Progress Notes (Signed)
TIARA, BARTOLI ([id number]) Visit Report for 11/28/2018 Chief Complaint Document Details Patient Name: Adam Merritt, Adam Merritt Date of Service: 11/28/2018 3:00 PM Medical Record Number: [id number] Patient Account Number: [id number] Date of Birth/Sex: 1946/02/08 (73 y.o. M) Treating RN: Army Melia Primary Care Provider: [name] Other Clinician: Referring Provider: [name] Treating Provider/Extender: Melburn Hake, Everado Pillsbury [name]: 8 Information Obtained from: Patient Chief Complaint he is here for evaluation of bilateral lower extremity lymphedema right right toe and left LE ulcers Electronic Signature(s) Signed: 12/01/2018 11:25:56 PM By: Worthy Keeler PA-C Entered By: Worthy Keeler on 11/28/2018 15:50:39 Adam Merritt ([id number]) -------------------------------------------------------------------------------- HPI Details Patient Name: Adam Merritt Date of Service: 11/28/2018 3:00 PM Medical Record Number: [id number] Patient Account Number: [id number] Date of Birth/Sex: [birth year] (73 y.o. M) Treating RN: Army Melia Primary Care Provider: [name] Other Clinician: Referring Provider: [name] Treating Provider/Extender: Melburn Hake, Savva Beamer [name]: 36 History of Present Illness HPI Description: 10/18/17-He is here for initial evaluation of bilateral lower extremity ulcerations in the presence of venous insufficiency and lymphedema. He has been seen by vascular medicine in the past, Dr. Lucky Cowboy, last seen in 2016. He does have a history of abnormal ABIs, which is to be expected given his lymphedema and venous insufficiency. According to Epic, it appears that all attempts for arterial evaluation and/or angiography were not follow through with by patient. He does have a history of being seen in lymphedema clinic in 2018, stopped going approximately 6 months ago stating "it didn't do any good". He does not have lymphedema pumps, he does not have custom fit  compression wrap/stockings. He is diabetic and his recent A1c last month was 7.6. He admits to chronic bilateral lower extremity pain, no change in pain since blister and ulceration development. He is currently being treated with Levaquin for bronchitis. He has home health and we will continue. 10/25/17-He is here in follow-up evaluation for bilateral lower extremity ulcerationssubtle he remains on Levaquin for bronchitis. Right lower extremity with no evidence of drainage or ulceration, persistent left lower extremity ulceration. He states that home health has not been out since his appointment. He went to [facility] on Tuesday, studies revealed: RIGHT ABI 0.9, TBI 0.6 LEFT ABI 1.1, TBI 0.6 with triphasic flow bilaterally. We will continue his same treatment plan. He has been educated on compression therapy and need for elevation. He will benefit from lymphedema pumps 11/01/17-He is here in follow-up evaluation for left lower extremity ulcer. The right lower extremity remains healed. He has home health services, but they have not been out to see the patient for 2-3 weeks. He states it home health physical therapy changed his dressing yesterday after therapy; he placed Ace wrap compression. We are still waiting for lymphedema pumps, reordered d/t need for company change. 11/08/17-He is here in follow-up evaluation for left lower extremity ulcer. It is improved. Edema is significantly improved with compression therapy. We will continue with same treatment plan and he will follow-up next week. No word regarding lymphedema pumps 11/15/17-He is here in follow-up evaluation for left lower extremity ulcer. He is healed and will be discharged from wound care services. I have reached out to [facility] regarding his lymphedema pumps. They have been unable to reach the patient; the contact number they had with the patient's wife's cell phone and she has not answered any unrecognized  calls. Contact should be made today, trial planned for next week; [facility] will continue to  follow 11/27/17 on evaluation today patient has multiple blistered areas over the right lower extremity his left lower extremity appears to be doing okay. These blistered areas show signs of no infection which is great news. With that being said he did have some necrotic skin overlying which was mechanically debrided away with saline and gauze today without complication. Overall post debridement the wounds appear to be doing better but in general his swelling seems to be increased. This is obviously not good news. I think this is what has given rise to the blisters. 12/04/17 on evaluation today patient presents for follow-up concerning his bilateral lower extremity edema in the right lower extremity ulcers. He has been tolerating the dressing changes without complication. With that being said he has had no real issues with the wraps which is also good news. Overall I'm pleased with the progress he's been making. 12/11/17 on evaluation today patient appears to be doing rather well in regard to his right lateral lower extremity ulcer. He's been tolerating the dressing changes without complication. Fortunately there does not appear to be any evidence of infection at this time. Overall I'm pleased with the progress that is being made. Unfortunately he has been in the hospital due to having what sounds to be a stomach virus/flu fortunately that is starting to get better. 12/18/17 on evaluation today patient actually appears to be doing very well in regard to his bilateral lower extremities the swelling is under fairly good control his lymphedema pumps are still not up and running quite yet. With that being said he does have several areas of opening noted as far as wounds are concerned mainly over the left lower extremity. With that being said I do believe once he gets lymphedema pumps this would least help you  mention some the fluid and preventing this from occurring. Hopefully that will be set up soon sleeves are Artie in place at his home he just waiting for the machine. 12/25/17 on evaluation today patient actually appears to be doing excellent in fact all of his ulcers appear to have resolved his Adam Merritt, Adam Merritt. ([id number]) legs appear very well. I do think he needs compression stockings we have discussed this and they are actually going to go to [place] today to elastic therapy to get this fitted for him. I think that is definitely a good thing to do. Readmission: 04/09/18 upon evaluation today this patient is seen for readmission due to bilateral lower extremity lymphedema. He has significant swelling of his extremities especially on the left although the right is also swollen he has weeping from both sides. There are no obvious open wounds at this point. Fortunately he has been doing fairly well for quite a bit of time since I last saw him. Nonetheless unfortunately this seems to have reopened and is giving quite a bit of trouble. He states this began about a week ago when he first called [place] to get in to be seen. No fevers, chills, nausea, or vomiting noted at this time. He has not been using his lymphedema pumps due to the fact that they won't fit on his leg at this point likewise is also not been using his compression for essentially the same reason. 04/16/18 upon evaluation today patient actually appears to be doing a little better in regard to the fluid in his bilateral lower extremities. With that being said he's had three falls since I saw him last week. He also states that he's been feeling very poorly. I was  concerned last week and feel like that the concern is still there as far as the congestion in his chest is concerned he seems to be breathing about the same as last week but again he states he's very weak he's not even able to walk further than from the chair to the door. His wife had  to buy a wheelchair just to be able to get them out of the house to get to the appointment today. This has me very concerned. 04/23/18 on evaluation today patient actually appears to be doing much better than last week's evaluation. At that time actually had to transport him to the ER via EMS and he subsequently was admitted for acute pulmonary edema, acute renal failure, and acute congestive heart failure. Fortunately he is doing much better. Apparently they did dialyze him and were able to take off roughly 35 pounds of fluid. Nonetheless he is feeling much better both in regard to his breathing and he's able to get around much better at this time compared to previous. Overall I'm very happy with how things are at this time. There does not appear to be any evidence of infection currently. No fevers, chills, nausea, or vomiting noted at this time. 04/30/2018 patient seen today for follow-up and management of bilateral lower extremity lymphedema. He did express being more sad today than usual due to the recent loss of his dog. He states that he has been compliant with using the lymphedema pumps. However he does admit a minute over the last 2-3 days he has not been using the pumps due to the recent loss of his dog. At this time there is no drainage or open wounds to his lower extremities. The left leg edema is measuring smaller today. Still has a significant amount of edema on bilateral lower extremities With dry flaky skin. He will be referred to the lymphedema clinic for further management. Will continue 3 layer compression wraps and follow-up in 1-2 weeks.Denies any pain, fever, chairs recently. No recent falls or injuries reported during this visit. 05/07/18 on evaluation today patient actually appears to be doing very well in regard to his lower extremities in general all things considered. With that being said he is having some pain in the legs just due to the amount of swelling. He does have an  area where he had a blister on the left lateral lower extremity this is open at this point other than that there's nothing else weeping at this time. 05/14/18 on evaluation today patient actually appears to be doing excellent all things considered in regard to his lower extremities. He still has a couple areas of weeping on each leg which has continued to be the issue for him. He does have an appointment with the lymphedema clinic although this isn't until February 2020. That was the earliest they had. In the meantime he has continued to tolerate the compression wraps without complication. 05/28/18 on evaluation today patient actually appears to be doing more poorly in regard to his left lower extremity where he has a wound open at this point. He also had a fall where he subsequently injured his right great toe which has led to an open wound at the site unfortunately. He has been tolerating the dressing changes without complication in general as far as the wraps are concerned that he has not been putting any dressing on the left 1st toe ulcer site. 06/11/18 on evaluation today patient appears to be doing much worse in regard to his  bilateral lower extremity ulcers. He has been tolerating the dressing changes without complication although his legs have not been wrapped more recently. Overall I am not very pleased with the way his legs appear. I do believe he needs to be back in compression wraps he still has not received his compression wraps from the [facility] hospital as of yet. 06/18/18 on evaluation today patient actually appears to be doing significantly better than last time I saw him. He has been tolerating the compression wraps without complication in the circumferential ulcers especially appear to be doing much better. His toe ulcer on the right in regard to the great toe is better although not as good as the legs in my pinion. No fevers chills noted Adam Merritt, Adam Merritt ([id number]) 07/02/18 on evaluation  today patient appears to be doing much better in regard to his lower extremity ulcers. Unfortunately since I last saw him he's had the distal portion of his right great toe if he dated it sounds as if this actually went downhill very quickly. I had only seen him a few days prior and the toe did not appear to be infected at that point subsequently became infected very rapidly and it was decided by the surgeon that the distal portion of the toe needed to be removed. The patient seems to be doing well in this regard he tells me. With that being said his lower extremities are doing better from the standpoint of the wounds although he is significantly swollen at this point. 07/09/18 on evaluation today patient appears to be doing better in regard to the wounds on his lower extremities. In fact everything is almost completely healed he is just a small area on the left posterior lower extremity that is open at this point. 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 With that being said he needs some kind of compression it does sound like he may not be wearing his compression, that is the wraps, during the entire time between when he's here visit to visit. Apparently his wife took the current one off because it began to "fall apart". 07/16/18 on evaluation today patient appears to be extremely swollen especially in regard to his left lower extremity unfortunately. He also has a new skin tear over the left lower extremity and there's a smaller area on the right lower extremity as well. Unfortunately this seems to be due in part to blistering and fluid buildup in his leg. He did get the reduction wraps that were ordered by the [facility] hospital for him to go to lymphedema clinic. With that being said his wounds on the legs have not healed  to the point to where they would likely accept them as a patient lymphedema clinic currently. We need to try to get this to heal. With that being said he's been taking his wraps off which is not doing him any favors at this point. In fact this is probably quite counterproductive compared to what needs to occur. We will likely need to increase to a four layer compression wrap and continue to also utilize elevation and he has to keep the wraps on not take them off as he's been doing currently hasn't had a wrap on since Saturday. 07/23/18 on evaluation today patient appears to be doing much better in regard to his bilateral lower extremities.  In fact his left lower extremity which was the largest is actually 15 cm smaller today compared to what it was last time he was here in our clinic. This is obviously good news after just one week. Nonetheless the differences he actually kept the wraps on during the entire week this time. That's not typical for him. I do believe he understands a little bit better now the severity of the situation and why it's important for him to keep these wraps on. 07/30/18 on evaluation today patient actually appears to be doing rather well in regard to his lower extremities. His legs are much smaller than they have been in the past and he actually has only one very small rudder superficial region remaining that is not closed on the left lateral/posterior lower extremity even this is almost completely close. I do believe likely next week he will be healed without any complications. I do think we need to continue the wraps however this seems to be beneficial for him. I also think it may be a good time for [place] to go ahead and see about getting the appointment with the lymphedema clinic which is supposed to be made for him in order to keep this moving along and hopefully get them into compression wraps that will in the end help him to remain healed. 08/06/18 on evaluation today patient  actually appears to be doing very well in regard as bilateral lower extremities. In fact his wounds appear to be completely healed at this time. 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 He is in the process of getting appointment with the lymphedema clinic we have made this referral were just waiting to hear back on the schedule time. We need to follow up on that today as well. 08/13/18 on evaluation today patient actually appears to be doing very well in regard to his bilateral lower extremities there are no open wounds at this point. We are gonna go ahead and see about ordering the Velcro compression wraps for him had a discussion with them about [place] doing it versus the [place] they feel like they can definitely afford going ahead and get the wraps themselves and they would prefer to try to avoid having to go to the lymphedema clinic if it all possible which I completely understand. As long as he has good compression I'm okay either way. 3/17//20 on evaluation today patient actually appears to be doing well in regard to his bilateral lower extremity ulcers. He has been tolerating the dressing changes without complication specifically the compression wraps. Overall is had no issues my fingers finding that I see at this point is that he is having trouble with constipation. He tells me he has not been able to go to the bathroom for about six days. He's taken to over-the-counter oral laxatives unfortunately this is not helping. He has not contacted his doctor. 08/27/18 on evaluation today patient appears to be doing fairly well in regard to his lower extremities at this point. There does not appear to be any new altars is swelling is very well controlled. We are still waiting for his Velcro compression wraps to arrive that should be sometime in the next week is Artie sent the check for these. 09/03/18 on evaluation today patient appears to be doing  excellent in regard to his bilateral lower extremities which he shows Adam Merritt, Adam Merritt. ([id number]) no signs of wound openings in regard to this point. He does have  his Velcro compression wraps which did arrive in the mail since I saw him last week. Overall he is doing excellent in my pinion. 09/13/18 on evaluation today patient appears to be doing very well currently in regard to the overall appearance of his bilateral lower extremities although he's a little bit more swollen than last time we saw him. At that point he been discharged without any open wounds. Nonetheless he has a small open wound on the posterior left lower extremity with some evidence of cellulitis noted as well. Fortunately I feel like he has made good progress overall with regard to his lower extremities from were things used to be. 09/20/18 on evaluation today patient actually appears to be doing much better. The erythematous lower extremity is improving wound itself which is still open appears to be doing much better as far as but appearance as well as pain is concerned overall very pleased in this regard. There's no signs of active infection at this time. 09/27/18 on evaluation today patient's wounds on the lower extremity actually appear to be doing fairly well at this time which is good news. There is no evidence of active infection currently and again is just as left lower extremity were there any wounds at this point anyway. I believe they may be completely healed but again I'm not 100% sure based on evaluation today. I think one more week of observation would likely be a good idea. 10/04/18 on evaluation today patient actually appears to be doing excellent in regard to the left lower extremity which actually appears to be completely healed as of today. Unfortunately he's been having issues with his right lower extremity have a new wound that has opened. Fortunately there's no evidence of active infection at this time which is  good news. 10/10/18 upon evaluation today patient actually appears to be doing a little bit worse with the new open area on his right posterior lower extremity. He's been tolerating the dressing changes without complication. Right now we been using his compression wraps although I think we may need to switch back to actually performing bilateral compression wraps are in the clinic. No fevers, chills, nausea, or vomiting noted at this time. 10/17/18 on evaluation today patient actually appears to be doing quite well in regard to his bilateral lower extremity ulcers. He has been tolerating the reps without complication although he would prefer not to rewrap his legs as of today. Fortunately there's no signs of active infection which is good news. No fevers, chills, nausea, or vomiting noted at this time. 10/24/18 on evaluation today patient appears to be doing very well in regard to his lower extremities. His right lower extremity is shown signs of healing and his left lower Trinity though not healed appears to be improving which is excellent news. Overall very pleased with how things seem to be progressing at this time. The patient is likewise happy to hear this. His Velcro compression wraps however have not been put on properly will gonna show his wife how to do that properly today. 10/31/18 on evaluation today patient appears to be doing more poorly in regard to his left lower extremity in particular although both lower extremities actually are showing some signs of being worse than my previous evaluation. Unfortunately I'm just not sure that his compression stockings even with the use of the compression/lymphedema pumps seem to be controlling this well. Upon further questioning he tells me that he also is not able to lie flat in the bed  due to his congestive heart failure and difficulty breathing. For that reason he sleeps in his recliner. To make matters worse his recliner also cannot even hold  his legs up so instead of even being somewhat elevated they pretty much hang down to the floor. This is the way he sleeps each night which is definitely counterproductive to everything else that were attempting to do from the standpoint of controlling his fluid. Nonetheless I think that he potentially could benefit from a hospital bed although this would be something that his primary care provider would likely have to order since anything that is order on our side has to be directly related to wound care and again the hospital bed is not necessarily a direct relation to although I think it does contribute to his overall wound status on his lower extremities. 11/07/18 on evaluation today patient appears to be doing better in regard to his bilateral lower extremity. He's been tolerating the dressing changes without complication. We did in the interim since I last saw him switch to just using extras orbiting new alginate and that seems to have done much better for him. I'm very pleased with the overall progress that is made. 11/14/18 on evaluation today patient appears to be redoing rather well in regard to his left lower extremity ulcers which are the only ones remaining at this point. Fortunately there's no signs of active infection at this time which is good news. Overall been very pleased with how things seem to be progressing currently. No fevers, chills, nausea, or vomiting noted at this time. 11/20/17 on evaluation today patient actually appears to be doing quite well in regard to his lower extremities on the left on the right he has several blisters that showed up although there's some question about whether or not he has had his broker compression wraps on like he was supposed to or not. Fortunately there's no signs of active infection at this time which is good news. Unfortunately though he is doing well from the standpoint of the left leg the right leg is not doing as well again this Adam Merritt, Adam Merritt. ([id number]) is when we did not wrap last week. 11/28/18 on evaluation today patient appears to be doing well in regard to his bilateral lower extremities is left appears to be healed is right is not healed but is very close to being so. Overall very pleased with how things seem to be progressing. Patient is likewise happy that things are doing well. Electronic Signature(s) Signed: 12/01/2018 11:25:56 PM By: Worthy Keeler PA-C Entered By: Worthy Keeler on 11/28/2018 23:51:15 Adam Merritt ([id number]) -------------------------------------------------------------------------------- Physical Exam Details Patient Name: Adam Merritt Date of Service: 11/28/2018 3:00 PM Medical Record Number: [id number] Patient Account Number: [id number] Date of Birth/Sex: 05-23-1946 (73 y.o. M) Treating RN: Army Melia Primary Care Provider: [name] Other Clinician: Referring Provider: [name] Treating Provider/Extender: STONE III, Kaliyah Gladman [name]: 45 Constitutional Well-nourished and well-hydrated in no acute distress. Respiratory normal breathing without difficulty. clear to auscultation bilaterally. Cardiovascular regular rate and rhythm with normal S1, S2. Psychiatric this patient is able to make decisions and demonstrates good insight into disease process. Alert and Oriented x 3. pleasant and cooperative. Notes Patient's wounds did not require any sharp debridement today which is excellent news. I do feel like overall he is showing signs of improvement which is excellent and I do believe that he will continue to do well at the wraps. Electronic Signature(s) Signed: 12/01/2018  11:25:56 PM By: Worthy Keeler PA-C Entered By: Worthy Keeler on 11/28/2018 23:52:00 Adam Merritt ([id number]) -------------------------------------------------------------------------------- Physician Orders Details Patient Name: Adam Merritt Date of Service: 11/28/2018 3:00 PM Medical  Record Number: [id number] Patient Account Number: [id number] Date of Birth/Sex: [birth year] (73 y.o. 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 Follow-up Appointments Wound #23 Right,Lateral Lower Leg o Return Appointment in 1 week. - 12/09/2018 o Nurse Visit as needed - 12/02/18 and 12/05/18 Edema Control Wound #23 Right,Lateral Lower Leg o 4 Layer Compression System - Bilateral Electronic Signature(s) Signed: 11/28/2018 5:10:09 PM By: Montey Hora Signed: 12/01/2018 11:25:56 PM By: Worthy Keeler PA-C Entered By: Montey Hora on 11/28/2018 16:12:32 Adam Merritt, Adam Merritt ([id number]) Adam Merritt, Adam Merritt ([id number]) -------------------------------------------------------------------------------- Problem List Details Patient Name: Adam Merritt Date of Service: 11/28/2018 3:00 PM Medical Record Number: [id number] Patient Account Number: [id number] Date of Birth/Sex: May 12, 1946 (73 y.o. M) Treating RN: Army Melia Primary Care Provider: [name] Other Clinician: Referring  Provider: [name] Treating Provider/Extender: Melburn Hake, Xavian Hardcastle [name]: 49 Active Problems ICD-10 Evaluated Encounter Code Description Active Date Today Diagnosis I89.0 Lymphedema, not elsewhere classified 04/09/2018 No Yes L97.512 Non-pressure chronic ulcer of other part of right foot with fat 05/28/2018 No Yes layer exposed L97.822 Non-pressure chronic ulcer of other part of left lower leg with 05/28/2018 No Yes fat layer exposed L98.492 Non-pressure chronic ulcer of skin of other sites with fat layer 06/11/2018 No Yes exposed E66.9 Obesity, unspecified 04/09/2018 No Yes Inactive Problems Resolved Problems ICD-10 Code Description Active Date Resolved Date L03.115 Cellulitis of right lower limb 04/09/2018 04/09/2018 L03.116 Cellulitis of left lower limb 04/09/2018 04/09/2018 Electronic Signature(s) Signed: 12/01/2018 11:25:56 PM By: Worthy Keeler PA-C Entered By: Worthy Keeler on 11/28/2018 15:50:32 Adam Merritt ([id number]) Adam Merritt, Adam Merritt ([id number]) -------------------------------------------------------------------------------- Progress Note Details Patient Name: Adam Merritt Date of Service: 11/28/2018 3:00 PM Medical Record Number: [id number] Patient Account Number: [id number] Date of Birth/Sex: 1945-06-17 (73 y.o. M) Treating RN: Army Melia Primary Care Provider: [name] Other Clinician: Referring Provider: [name] Treating Provider/Extender: Melburn Hake, Dina Mobley [name]: 61 Subjective Chief Complaint Information obtained from Patient he is here for evaluation of bilateral lower extremity lymphedema right right toe and left LE ulcers History of Present Illness (HPI) 10/18/17-He is here for initial evaluation of bilateral lower extremity ulcerations in the presence of venous insufficiency and lymphedema. He has been seen by vascular medicine in the past, Dr. Lucky Cowboy, last seen in 2016. He does have a history of abnormal ABIs, which is to be  expected given his lymphedema and venous insufficiency. According to Epic, it appears that all attempts for arterial evaluation and/or angiography  were not follow through with by patient. He does have a history of being seen in lymphedema clinic in 2018, stopped going approximately 6 months ago stating "it didn't do any good". He does not have lymphedema pumps, he does not have custom fit compression wrap/stockings. He is diabetic and his recent A1c last month was 7.6. He admits to chronic bilateral lower extremity pain, no change in pain since blister and ulceration development. He is currently being treated with Levaquin for bronchitis. He has home health and we will continue. 10/25/17-He is here in follow-up evaluation for bilateral lower extremity ulcerationssubtle he remains on Levaquin for bronchitis. Right lower extremity with no evidence of drainage or ulceration, persistent left lower extremity ulceration. He states that home health has not been out since his appointment. He went to [facility] on Tuesday, studies revealed: RIGHT ABI 0.9, TBI 0.6 LEFT ABI 1.1, TBI 0.6 with triphasic flow bilaterally. We will continue his same treatment plan. He has been educated on compression therapy and need for elevation. He will benefit from lymphedema pumps 11/01/17-He is here in follow-up evaluation for left lower extremity ulcer. The right lower extremity remains healed. He has home health services, but they have not been out to see the patient for 2-3 weeks. He states it home health physical therapy changed his dressing yesterday after therapy; he placed Ace wrap compression. We are still waiting for lymphedema pumps, reordered d/t need for company change. 11/08/17-He is here in follow-up evaluation for left lower extremity ulcer. It is improved. Edema is significantly improved with compression therapy. We will continue with same treatment plan and he will follow-up next week. No word  regarding lymphedema pumps 11/15/17-He is here in follow-up evaluation for left lower extremity ulcer. He is healed and will be discharged from wound care services. I have reached out to [facility] regarding his lymphedema pumps. They have been unable to reach the patient; the contact number they had with the patient's wife's cell phone and she has not answered any unrecognized calls. Contact should be made today, trial planned for next week; [facility] will continue to follow 11/27/17 on evaluation today patient has multiple blistered areas over the right lower extremity his left lower extremity appears to be doing okay. These blistered areas show signs of no infection which is great news. With that being said he did have some necrotic skin overlying which was mechanically debrided away with saline and gauze today without complication. Overall post debridement the wounds appear to be doing better but in general his swelling seems to be increased. This is obviously not good news. I think this is what has given rise to the blisters. 12/04/17 on evaluation today patient presents for follow-up concerning his bilateral lower extremity edema in the right lower extremity ulcers. He has been tolerating the dressing changes without complication. With that being said he has had no real issues with the wraps which is also good news. Overall I'm pleased with the progress he's been making. 12/11/17 on evaluation today patient appears to be doing rather well in regard to his right lateral lower extremity ulcer. He's been tolerating the dressing changes without complication. Fortunately there does not appear to be any evidence of infection at this time. Overall I'm pleased with the progress that is being made. Unfortunately he has been in the hospital due to having what sounds to be a stomach virus/flu fortunately that is starting to get better. 12/18/17 on evaluation today patient actually appears to  be  doing very well in regard to his bilateral lower extremities the NIXON, KOLTON. ([id number]) swelling is under fairly good control his lymphedema pumps are still not up and running quite yet. With that being said he does have several areas of opening noted as far as wounds are concerned mainly over the left lower extremity. With that being said I do believe once he gets lymphedema pumps this would least help you mention some the fluid and preventing this from occurring. Hopefully that will be set up soon sleeves are Artie in place at his home he just waiting for the machine. 12/25/17 on evaluation today patient actually appears to be doing excellent in fact all of his ulcers appear to have resolved his legs appear very well. I do think he needs compression stockings we have discussed this and they are actually going to go to [facility] today to elastic therapy to get this fitted for him. I think that is definitely a good thing to do. Readmission: 04/09/18 upon evaluation today this patient is seen for readmission due to bilateral lower extremity lymphedema. He has significant swelling of his extremities especially on the left although the right is also swollen he has weeping from both sides. There are no obvious open wounds at this point. Fortunately he has been doing fairly well for quite a bit of time since I last saw him. Nonetheless unfortunately this seems to have reopened and is giving quite a bit of trouble. He states this began about a week ago when he first called [place] to get in to be seen. No fevers, chills, nausea, or vomiting noted at this time. He has not been using his lymphedema pumps due to the fact that they won't fit on his leg at this point likewise is also not been using his compression for essentially the same reason. 04/16/18 upon evaluation today patient actually appears to be doing a little better in regard to the fluid in his bilateral lower extremities. With that being said he's  had three falls since I saw him last week. He also states that he's been feeling very poorly. I was concerned last week and feel like that the concern is still there as far as the congestion in his chest is concerned he seems to be breathing about the same as last week but again he states he's very weak he's not even able to walk further than from the chair to the door. His wife had to buy a wheelchair just to be able to get them out of the house to get to the appointment today. This has me very concerned. 04/23/18 on evaluation today patient actually appears to be doing much better than last week's evaluation. At that time actually had to transport him to the ER via EMS and he subsequently was admitted for acute pulmonary edema, acute renal failure, and acute congestive heart failure. Fortunately he is doing much better. Apparently they did dialyze him and were able to take off roughly 35 pounds of fluid. Nonetheless he is feeling much better both in regard to his breathing and he's able to get around much better at this time compared to previous. Overall I'm very happy with how things are at this time. There does not appear to be any evidence of infection currently. No fevers, chills, nausea, or vomiting noted at this time. 04/30/2018 patient seen today for follow-up and management of bilateral lower extremity lymphedema. He did express being more sad today than usual due to the  recent loss of his dog. He states that he has been compliant with using the lymphedema pumps. However he does admit a minute over the last 2-3 days he has not been using the pumps due to the recent loss of his dog. At this time there is no drainage or open wounds to his lower extremities. The left leg edema is measuring smaller today. Still has a significant amount of edema on bilateral lower extremities With dry flaky skin. He will be referred to the lymphedema clinic for further management. Will continue 3 layer  compression wraps and follow-up in 1-2 weeks.Denies any pain, fever, chairs recently. No recent falls or injuries reported during this visit. 05/07/18 on evaluation today patient actually appears to be doing very well in regard to his lower extremities in general all things considered. With that being said he is having some pain in the legs just due to the amount of swelling. He does have an area where he had a blister on the left lateral lower extremity this is open at this point other than that there's nothing else weeping at this time. 05/14/18 on evaluation today patient actually appears to be doing excellent all things considered in regard to his lower extremities. He still has a couple areas of weeping on each leg which has continued to be the issue for him. He does have an appointment with the lymphedema clinic although this isn't until February 2020. That was the earliest they had. In the meantime he has continued to tolerate the compression wraps without complication. 05/28/18 on evaluation today patient actually appears to be doing more poorly in regard to his left lower extremity where he has a wound open at this point. He also had a fall where he subsequently injured his right great toe which has led to an open wound at the site unfortunately. He has been tolerating the dressing changes without complication in general as far as the wraps are concerned that he has not been putting any dressing on the left 1st toe ulcer site. 06/11/18 on evaluation today patient appears to be doing much worse in regard to his bilateral lower extremity ulcers. He has been tolerating the dressing changes without complication although his legs have not been wrapped more recently. Overall I am not very pleased with the way his legs appear. I do believe he needs to be back in compression wraps he still has not received his compression wraps from the [facility] hospital as of yet. Adam Merritt, Adam Merritt ([id number]) 06/18/18 on  evaluation today patient actually appears to be doing significantly better than last time I saw him. He has been tolerating the compression wraps without complication in the circumferential ulcers especially appear to be doing much better. His toe ulcer on the right in regard to the great toe is better although not as good as the legs in my pinion. No fevers chills noted 07/02/18 on evaluation today patient appears to be doing much better in regard to his lower extremity ulcers. Unfortunately since I last saw him he's had the distal portion of his right great toe if he dated it sounds as if this actually went downhill very quickly. I had only seen him a few days prior and the toe did not appear to be infected at that point subsequently became infected very rapidly and it was decided by the surgeon that the distal portion of the toe needed to be removed. The patient seems to be doing well in this regard he tells me.  With that being said his lower extremities are doing better from the standpoint of the wounds although he is significantly swollen at this point. 07/09/18 on evaluation today patient appears to be doing better in regard to the wounds on his lower extremities. In fact everything is almost completely healed he is just a small area on the left posterior lower extremity that is open at this point. 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 With that being said he needs some kind of compression it does sound like he may not be wearing his compression, that is the wraps, during the entire time between when he's here visit to visit. Apparently his wife took the current one off because it began to "fall apart". 07/16/18 on evaluation today patient appears to be extremely swollen especially in regard to his left lower  extremity unfortunately. He also has a new skin tear over the left lower extremity and there's a smaller area on the right lower extremity as well. Unfortunately this seems to be due in part to blistering and fluid buildup in his leg. He did get the reduction wraps that were ordered by the [facility] hospital for him to go to lymphedema clinic. With that being said his wounds on the legs have not healed to the point to where they would likely accept them as a patient lymphedema clinic currently. We need to try to get this to heal. With that being said he's been taking his wraps off which is not doing him any favors at this point. In fact this is probably quite counterproductive compared to what needs to occur. We will likely need to increase to a four layer compression wrap and continue to also utilize elevation and he has to keep the wraps on not take them off as he's been doing currently hasn't had a wrap on since Saturday. 07/23/18 on evaluation today patient appears to be doing much better in regard to his bilateral lower extremities. In fact his left lower extremity which was the largest is actually 15 cm smaller today compared to what it was last time he was here in our clinic. This is obviously good news after just one week. Nonetheless the differences he actually kept the wraps on during the entire week this time. That's not typical for him. I do believe he understands a little bit better now the severity of the situation and why it's important for him to keep these wraps on. 07/30/18 on evaluation today patient actually appears to be doing rather well in regard to his lower extremities. His legs are much smaller than they have been in the past and he actually has only one very small rudder superficial region remaining that is not closed on the left lateral/posterior lower extremity even this is almost completely close. I do believe likely next week he will be healed without any complications. I do  think we need to continue the wraps however this seems to be beneficial for him. I also think it may be a good time for [place] to go ahead and see about getting the appointment with the lymphedema clinic which is supposed to be made for him in order to keep this moving along and hopefully get them into compression wraps that will in the end help him to remain healed. 08/06/18 on evaluation today patient actually  appears to be doing very well in regard as bilateral lower extremities. In fact his wounds appear to be completely healed at this time. 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 He is in the process of getting appointment with the lymphedema clinic we have made this referral were just waiting to hear back on the schedule time. We need to follow up on that today as well. 08/13/18 on evaluation today patient actually appears to be doing very well in regard to his bilateral lower extremities there are no open wounds at this point. We are gonna go ahead and see about ordering the Velcro compression wraps for him had a discussion with them about [place] doing it versus the [place] they feel like they can definitely afford going ahead and get the wraps themselves and they would prefer to try to avoid having to go to the lymphedema clinic if it all possible which I completely understand. As long as he has good compression I'm okay either way. 3/17//20 on evaluation today patient actually appears to be doing well in regard to his bilateral lower extremity ulcers. He has been tolerating the dressing changes without complication specifically the compression wraps. Overall is had no issues my fingers finding that I see at this point is that he is having trouble with constipation. He tells me he has not been able to go to the bathroom for about six days. He's taken to over-the-counter oral laxatives unfortunately this is not helping. He has not contacted his  doctor. Adam Merritt, Adam Merritt ([id number]) 08/27/18 on evaluation today patient appears to be doing fairly well in regard to his lower extremities at this point. There does not appear to be any new altars is swelling is very well controlled. We are still waiting for his Velcro compression wraps to arrive that should be sometime in the next week is Artie sent the check for these. 09/03/18 on evaluation today patient appears to be doing excellent in regard to his bilateral lower extremities which he shows no signs of wound openings in regard to this point. He does have his Velcro compression wraps which did arrive in the mail since I saw him last week. Overall he is doing excellent in my pinion. 09/13/18 on evaluation today patient appears to be doing very well currently in regard to the overall appearance of his bilateral lower extremities although he's a little bit more swollen than last time we saw him. At that point he been discharged without any open wounds. Nonetheless he has a small open wound on the posterior left lower extremity with some evidence of cellulitis noted as well. Fortunately I feel like he has made good progress overall with regard to his lower extremities from were things used to be. 09/20/18 on evaluation today patient actually appears to be doing much better. The erythematous lower extremity is improving wound itself which is still open appears to be doing much better as far as but appearance as well as pain is concerned overall very pleased in this regard. There's no signs of active infection at this time. 09/27/18 on evaluation today patient's wounds on the lower extremity actually appear to be doing fairly well at this time which is good news. There is no evidence of active infection currently and again is just as left lower extremity were there any wounds at this point anyway. I believe they may be completely healed but again I'm not 100% sure based on evaluation today. I  think one  more week of observation would likely be a good idea. 10/04/18 on evaluation today patient actually appears to be doing excellent in regard to the left lower extremity which actually appears to be completely healed as of today. Unfortunately he's been having issues with his right lower extremity have a new wound that has opened. Fortunately there's no evidence of active infection at this time which is good news. 10/10/18 upon evaluation today patient actually appears to be doing a little bit worse with the new open area on his right posterior lower extremity. He's been tolerating the dressing changes without complication. Right now we been using his compression wraps although I think we may need to switch back to actually performing bilateral compression wraps are in the clinic. No fevers, chills, nausea, or vomiting noted at this time. 10/17/18 on evaluation today patient actually appears to be doing quite well in regard to his bilateral lower extremity ulcers. He has been tolerating the reps without complication although he would prefer not to rewrap his legs as of today. Fortunately there's no signs of active infection which is good news. No fevers, chills, nausea, or vomiting noted at this time. 10/24/18 on evaluation today patient appears to be doing very well in regard to his lower extremities. His right lower extremity is shown signs of healing and his left lower Trinity though not healed appears to be improving which is excellent news. Overall very pleased with how things seem to be progressing at this time. The patient is likewise happy to hear this. His Velcro compression wraps however have not been put on properly will gonna show his wife how to do that properly today. 10/31/18 on evaluation today patient appears to be doing more poorly in regard to his left lower extremity in particular although both lower extremities actually are showing some signs of being worse than my previous evaluation.  Unfortunately I'm just not sure that his compression stockings even with the use of the compression/lymphedema pumps seem to be controlling this well. Upon further questioning he tells me that he also is not able to lie flat in the bed due to his congestive heart failure and difficulty breathing. For that reason he sleeps in his recliner. To make matters worse his recliner also cannot even hold his legs up so instead of even being somewhat elevated they pretty much hang down to the floor. This is the way he sleeps each night which is definitely counterproductive to everything else that were attempting to do from the standpoint of controlling his fluid. Nonetheless I think that he potentially could benefit from a hospital bed although this would be something that his primary care provider would likely have to order since anything that is order on our side has to be directly related to wound care and again the hospital bed is not necessarily a direct relation to although I think it does contribute to his overall wound status on his lower extremities. 11/07/18 on evaluation today patient appears to be doing better in regard to his bilateral lower extremity. He's been tolerating the dressing changes without complication. We did in the interim since I last saw him switch to just using extras orbiting new alginate and that seems to have done much better for him. I'm very pleased with the overall progress that is made. 11/14/18 on evaluation today patient appears to be redoing rather well in regard to his left lower extremity ulcers which are the only ones remaining at this point. Fortunately  there's no signs of active infection at this time which is good news. Overall been Adam Merritt, Adam Merritt ([id number]) very pleased with how things seem to be progressing currently. No fevers, chills, nausea, or vomiting noted at this time. 11/20/17 on evaluation today patient actually appears to be doing quite well in regard to  his lower extremities on the left on the right he has several blisters that showed up although there's some question about whether or not he has had his broker compression wraps on like he was supposed to or not. Fortunately there's no signs of active infection at this time which is good news. Unfortunately though he is doing well from the standpoint of the left leg the right leg is not doing as well again this is when we did not wrap last week. 11/28/18 on evaluation today patient appears to be doing well in regard to his bilateral lower extremities is left appears to be healed is right is not healed but is very close to being so. Overall very pleased with how things seem to be progressing. Patient is likewise happy that things are doing well. Patient History Information obtained from Patient. Family History Cancer - Paternal Grandparents, Kidney Disease - Siblings, Lung Disease - Mother, No family history of Diabetes, Heart Disease, Hereditary Spherocytosis, Hypertension, Seizures, Stroke, Thyroid Problems, Tuberculosis. Social History Never smoker, Marital Status - Married, Alcohol Use - Never, Drug Use - No History, Caffeine Use - Never. Medical History Eyes Denies history of Cataracts, Glaucoma, Optic Neuritis Ear/Nose/Mouth/Throat Denies history of Chronic sinus problems/congestion, Middle ear problems Hematologic/Lymphatic Patient has history of Anemia - aplastic anemia, Lymphedema Denies history of Hemophilia, Human Immunodeficiency Virus, Sickle Cell Disease Respiratory Patient has history of Chronic Obstructive Pulmonary Disease (COPD), Sleep Apnea Denies history of Aspiration, Asthma, Pneumothorax, Tuberculosis Cardiovascular Patient has history of Congestive Heart Failure, Coronary Artery Disease, Hypertension, Peripheral Venous Disease Denies history of Angina, Arrhythmia, Deep Vein Thrombosis, Hypotension, Myocardial Infarction, Peripheral Arterial Disease, Phlebitis,  Vasculitis Gastrointestinal Denies history of Cirrhosis , Colitis, Crohn s, Hepatitis A, Hepatitis B, Hepatitis C Endocrine Patient has history of Type II Diabetes Denies history of Type I Diabetes Genitourinary Denies history of End Stage Renal Disease Immunological Denies history of Lupus Erythematosus, Raynaud s, Scleroderma Integumentary (Skin) Denies history of History of Burn, History of pressure wounds Musculoskeletal Patient has history of Osteoarthritis Denies history of Gout, Rheumatoid Arthritis, Osteomyelitis Neurologic Patient has history of Neuropathy Denies history of Dementia, Quadriplegia, Paraplegia, Seizure Disorder Oncologic Patient has history of Received Chemotherapy - last dose 2006 Denies history of Received Radiation Psychiatric ADDAM, GOELLER ([id number]) Denies history of Anorexia/bulimia, Confinement Anxiety Hospitalization/Surgery History - bronchitis. Medical And Surgical History Notes Respiratory home O2 at night as needed Cardiovascular varicose veins Neurologic CVA - 1992 Review of Systems (ROS) Constitutional Symptoms (General Health) Denies complaints or symptoms of Fatigue, Fever, Chills, Marked Weight Change. Respiratory Denies complaints or symptoms of Chronic or frequent coughs, Shortness of Breath. Cardiovascular Denies complaints or symptoms of Chest pain, LE edema. Psychiatric Denies complaints or symptoms of Anxiety, Claustrophobia. Objective Constitutional Well-nourished and well-hydrated in no acute distress. Vitals Time Taken: 3:46 PM, Height: 66 in, Weight: 323 lbs, BMI: 52.1, Temperature: 98.6 F, Pulse: 62 bpm, Respiratory Rate: 18 breaths/min, Blood Pressure: 126/64 mmHg. Respiratory normal breathing without difficulty. clear to auscultation bilaterally. Cardiovascular regular rate and rhythm with normal S1, S2. Psychiatric this patient is able to make decisions and demonstrates good insight into disease process.  Alert and Oriented x 3.  pleasant and cooperative. General Notes: Patient's wounds did not require any sharp debridement today which is excellent news. I do feel like overall he is showing signs of improvement which is excellent and I do believe that he will continue to do well at the wraps. Integumentary (Hair, Skin) Wound #22 status is Open. Original cause of wound was Gradually Appeared. The wound is located on the Left,Circumferential Lower Leg. The wound measures 0cm length x 0cm width x 0cm depth; 0cm^2 area and 0cm^3 volume. Wound #23 status is Open. Original cause of wound was Gradually Appeared. The wound is located on the Right,Lateral Lower Leg. The wound measures 1cm length x 1.8cm width x 0.1cm depth; 1.414cm^2 area and 0.141cm^3 volume. The JADER, DESAI. ([id number]) wound is limited to skin breakdown. There is no tunneling or undermining noted. There is a medium amount of serous drainage noted. The wound margin is flat and intact. There is large (67-100%) red granulation within the wound bed. There is no necrotic tissue within the wound bed. 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 There was a Four Layer Compression Therapy Procedure with a pre-treatment ABI of 1.1 by Montey Hora, RN. Post procedure Diagnosis Wound #23: Same as Pre-Procedure Plan Wound Cleansing: Wound #23 Right,Lateral Lower Leg: Other: - Please do not get your wraps wet Skin Barriers/Peri-Wound Care: Wound #23 Right,Lateral Lower Leg: Moisturizing lotion Secondary Dressing: Wound #23 Right,Lateral Lower  Leg: ABD pad Non-adherent pad Dressing Change Frequency: Wound #23 Right,Lateral Lower Leg: Dressing is to be changed Monday and Thursday. Follow-up Appointments: Wound #23 Right,Lateral Lower Leg: Return Appointment in 1 week. - 12/09/2018 Nurse Visit as needed - 12/02/18 and 12/05/18 Edema Control: Wound #23 Right,Lateral Lower Leg: 4 Layer Compression System - Bilateral RIYAN, HAILE ([id number]) We will continue to wrap his legs for the next week. Subsequently I will see him in roughly a week and a half at which point we will see about reinitiating his Velcro compression wraps in the meantime is gonna try to find the ones that I think you should be wearing right now they have not been able to find those. Who otherwise see were things stand at follow-up. Please see above for specific wound care orders. We will see patient for re-evaluation in 1 week(s) here in the clinic. If anything worsens or changes patient will contact our office for additional recommendations. Electronic Signature(s) Signed: 12/01/2018 11:25:56 PM By: Worthy Keeler PA-C Entered By: Worthy Keeler on 11/28/2018 23:52:13 Adam Merritt ([id number]) -------------------------------------------------------------------------------- ROS/PFSH Details Patient Name: Adam Merritt Date of Service: 11/28/2018 3:00 PM Medical Record Number: [id number] Patient Account Number: [id number] Date of Birth/Sex: January 26, 1946 (73 y.o. M) Treating RN: Army Melia Primary Care Provider: [name] Other Clinician: Referring Provider: [name] Treating Provider/Extender: Melburn Hake, Illyanna Petillo [name]: 41 Information Obtained From Patient Constitutional Symptoms (General Health) Complaints and Symptoms: Negative for: Fatigue; Fever; Chills; Marked Weight Change Respiratory Complaints and Symptoms: Negative for: Chronic or frequent coughs; Shortness of Breath Medical History: Positive for: Chronic Obstructive Pulmonary  Disease (COPD); Sleep Apnea Negative for: Aspiration; Asthma; Pneumothorax; Tuberculosis Past Medical History Notes: home O2 at  night as needed Cardiovascular Complaints and Symptoms: Negative for: Chest pain; LE edema Medical History: Positive for: Congestive Heart Failure; Coronary Artery Disease; Hypertension; Peripheral Venous Disease Negative for: Angina; Arrhythmia; Deep Vein Thrombosis; Hypotension; Myocardial Infarction; Peripheral Arterial Disease; Phlebitis; Vasculitis Past Medical History Notes: varicose veins Psychiatric Complaints and Symptoms: Negative for: Anxiety; Claustrophobia Medical History: Negative for: Anorexia/bulimia; Confinement Anxiety Eyes Medical History: Negative for: Cataracts; Glaucoma; Optic Neuritis Ear/Nose/Mouth/Throat Medical History: Negative for: Chronic sinus problems/congestion; Middle ear problems Hematologic/Lymphatic DAMAR, PETIT ([id number]) Medical History: Positive for: Anemia - aplastic anemia; Lymphedema Negative for: Hemophilia; Human Immunodeficiency Virus; Sickle Cell Disease Gastrointestinal Medical History: Negative for: Cirrhosis ; Colitis; Crohnos; Hepatitis A; Hepatitis B; Hepatitis C Endocrine Medical History: Positive for: Type II Diabetes Negative for: Type I Diabetes Time with diabetes: since 2006 Treated with: Insulin Blood sugar tested every day: Yes Tested : Blood sugar testing results: Breakfast: 209 Genitourinary Medical History: Negative for: End Stage Renal Disease Immunological Medical History: Negative for: Lupus Erythematosus; Raynaudos; Scleroderma Integumentary (Skin) Medical History: Negative for: History of Burn; History of pressure wounds Musculoskeletal Medical History: Positive for: Osteoarthritis Negative for: Gout; Rheumatoid Arthritis; Osteomyelitis Neurologic Medical History: Positive for: Neuropathy Negative for: Dementia; Quadriplegia; Paraplegia; Seizure Disorder Past  Medical History Notes: CVA - 1992 Oncologic Medical History: Positive for: Received Chemotherapy - last dose 2006 Negative for: Received Radiation Immunizations Pneumococcal Vaccine: Received Pneumococcal Vaccination: Yes ARHAAN, CHESNUT ([id number]) Implantable Devices No devices added Hospitalization / Surgery History Type of Hospitalization/Surgery bronchitis Family and Social History Cancer: Yes - Paternal Grandparents; Diabetes: No; Heart Disease: No; Hereditary Spherocytosis: No; Hypertension: No; Kidney Disease: Yes - Siblings; Lung Disease: Yes - Mother; Seizures: No; Stroke: No; Thyroid Problems: No; Tuberculosis: No; Never smoker; Marital Status - Married; Alcohol Use: Never; Drug Use: No History; Caffeine Use: Never; Financial Concerns: No; Food, Clothing or Shelter Needs: No; Support System Lacking: No; Transportation Concerns: No Physician Affirmation I have reviewed and agree with the above information. Electronic Signature(s) Signed: 11/29/2018 3:22:52 PM By: Army Melia Signed: 12/01/2018 11:25:56 PM By: Worthy Keeler PA-C Entered By: Worthy Keeler on 11/28/2018 23:51:33 CAELEN, REIERSON ([id number]) -------------------------------------------------------------------------------- SuperBill Details Patient Name: Adam Merritt Date of Service: 11/28/2018 Medical Record Number: [id number] Patient Account Number: [id number] Date of Birth/Sex: 1946-01-26 (73 y.o. M) Treating RN: Montey Hora Primary Care Provider: [name] Other Clinician: Referring Provider: [name] Treating Provider/Extender: Melburn Hake, Gladine Plude [name]: 33 Diagnosis Coding ICD-10 Codes Code Description I89.0 Lymphedema, not elsewhere classified L97.512 Non-pressure chronic ulcer of other part of right foot with fat layer exposed L97.822 Non-pressure chronic ulcer of other part of left lower leg with fat layer exposed L98.492 Non-pressure chronic ulcer of skin of other sites  with fat layer exposed E66.9 Obesity, unspecified Facility Procedures CPT4: Description Modifier Quantity Code [id number] BILATERAL: Application of multi-layer venous compression system; leg (below 1 knee), including ankle and foot. Physician Procedures CPT4 Code Description: [id number] - WC PHYS LEVEL 4 - EST PT ICD-10 Diagnosis Description I89.0 Lymphedema, not elsewhere classified L97.512 Non-pressure chronic ulcer of other part of right foot with fat L97.822 Non-pressure chronic ulcer of other  part of left lower leg with L98.492 Non-pressure chronic ulcer of skin of other sites with fat layer Modifier: layer exposed fat layer expos exposed Quantity: 1 ed Electronic Signature(s) Signed: 12/01/2018 11:25:56 PM By: Worthy Keeler PA-C Previous Signature: 11/28/2018 5:10:09 PM Version By: Montey Hora Entered By: Worthy Keeler on 11/28/2018 23:52:34

## 2018-12-01 NOTE — Progress Notes (Signed)
ARIO, MCDIARMID ([id number]) Visit Report for 11/28/2018 Arrival Information Details Patient Name: Adam Merritt, Adam Merritt Date of Service: 11/28/2018 3:00 PM Medical Record Number: [id number] Patient Account Number: [id number] Date of Birth/Sex: 1945/06/21 (73 y.o. M) Treating RN: Army Melia Primary Care Noga Fogg: [name] Other Clinician: Referring Mahika Vanvoorhis: [name] Treating Benino Korinek/Extender: Melburn Hake, [name]: 28 Visit Information History Since Last Visit Added or deleted any medications: No Patient Arrived: Walker Any new allergies or adverse reactions: No Arrival Time: 15:45 Had a fall or experienced change in No Accompanied By: wife activities of daily living that may affect Transfer Assistance: None risk of falls: Patient Identification Verified: Yes Signs or symptoms of abuse/neglect since last visito No Secondary Verification Process Yes Hospitalized since last visit: No Completed: Implantable device outside of the clinic excluding No Patient Has Alerts: Yes cellular tissue based products placed in the center Patient Alerts: Patient on Blood since last visit: Thinner Has Dressing in Place as Prescribed: Yes Aspirin 325mg  Pain Present Now: No Type II Diabetes ABI 5/19 L 1.1 R 0.9 TBI L 0.6 R 0.6 Electronic Signature(s) Signed: 11/28/2018 4:04:47 PM By: Lorine Bears RCP, RRT, CHT Entered By: Lorine Bears on 11/28/2018 15:45:47 Adam Merritt ([id number]) -------------------------------------------------------------------------------- Compression Therapy Details Patient Name: Adam Merritt Date of Service: 11/28/2018 3:00 PM Medical Record Number: [id number] Patient Account Number: [id number] Date of Birth/Sex: 23-Dec-1945 (73 y.o. M) Treating RN: Montey Hora Primary Care Ilsa Bonello: [name] Other Clinician: Referring Euclide Granito: [name] Treating Stevin Bielinski/Extender: Melburn Hake, [name]:  33 Compression Therapy Performed for Wound Assessment: Wound #23 Right,Lateral Lower Leg Performed By: Clinician Montey Hora, RN Compression Type: Four Layer Pre Treatment ABI: 1.1 Post Procedure Diagnosis Same as Pre-procedure Electronic Signature(s) Signed: 11/28/2018 5:10:09 PM By: Montey Hora Entered By: Montey Hora on 11/28/2018 16:10:04 Adam Merritt ([id number]) -------------------------------------------------------------------------------- Encounter Discharge Information Details Patient Name: Adam Merritt Date of Service: 11/28/2018 3:00 PM Medical Record Number: [id number] Patient Account Number: [id number] Date of Birth/Sex: 1945/10/12 (73 y.o. M) Treating RN: Montey Hora Primary Care Grady Lucci: [name] Other Clinician: Referring Courtlynn Holloman: [name] Treating Mordechai Matuszak/Extender: Melburn Hake, [name]: 74 Encounter Discharge Information Items Discharge Condition: Stable Ambulatory Status: Walker Discharge Destination: Home Transportation: Private Auto Accompanied By: self Schedule Follow-up Appointment: Yes Clinical Summary of Care: Electronic Signature(s) Signed: 11/28/2018 5:10:09 PM By: Montey Hora Entered By: Montey Hora on 11/28/2018 16:14:12 Adam Merritt ([id number]) -------------------------------------------------------------------------------- Lower Extremity Assessment Details Patient Name: Adam Merritt Date of Service: 11/28/2018 3:00 PM Medical Record Number: [id number] Patient Account Number: [id number] Date of Birth/Sex: 11-19-1945 (73 y.o. M) Treating RN: Montey Hora Primary Care Kersten Salmons: [name] Other Clinician: Referring Zelphia Glover: [name] Treating Chloris Marcoux/Extender: Melburn Hake, [name]: 33 Edema Assessment Assessed: [Left: No] [Right: No] Edema: [Left: Yes] [Right: Yes] Calf Left: Right: Point of Measurement: 37 cm From Medial Instep 59 cm 52 cm Ankle Left: Right: Point of  Measurement: 12 cm From Medial Instep 37.5 cm 29 cm Vascular Assessment Pulses: Dorsalis Pedis Palpable: [Left:Yes] [Right:Yes] Electronic Signature(s) Signed: 11/28/2018 5:10:09 PM By: Montey Hora Entered By: Montey Hora on 11/28/2018 Merritt, Adam W. ([id number]) -------------------------------------------------------------------------------- Multi Wound Chart Details Patient Name: Adam Merritt Date of Service: 11/28/2018 3:00 PM Medical Record Number: [id number] Patient Account Number: [id number] Date of Birth/Sex: 1946/05/22 (73 y.o. M) Treating RN: Montey Hora Primary Care Lanson Randle: [name] Other Clinician: Referring [place] Inthavong: [name] Treating Marlen Koman/Extender: Melburn Hake, [name]  in Treatment: 33 Vital Signs Height(in): 66 Pulse(bpm): 62 Weight(lbs): 323 Blood Pressure(mmHg): 126/64 Body Mass Index(BMI): 52 Temperature(F): 98.6 Respiratory Rate 18 (breaths/min): Photos: [22:No Photos] [N/A:N/A] Wound Location: Left, Circumferential Lower Right Lower Leg - Lateral N/A Leg Wounding Event: Gradually Appeared Gradually Appeared N/A Primary Etiology: Lymphedema Lymphedema N/A Comorbid History: N/A Anemia, Lymphedema, N/A Chronic Obstructive Pulmonary Disease (COPD), Sleep Apnea, Congestive Heart Failure, Coronary Artery Disease, Hypertension, Peripheral Venous Disease, Type II Diabetes, Osteoarthritis, Neuropathy, Received Chemotherapy Date Acquired: 10/25/2018 11/28/2018 N/A Weeks of Treatment: 4 0 N/A Wound Status: Open Open N/A Measurements L x W x D 0x0x0 1x1.8x0.1 N/A (cm) Area (cm) : 0 1.414 N/A Volume (cm) : 0 0.141 N/A % Reduction in Area: 100.00% N/A N/A % Reduction in Volume: 100.00% N/A N/A Classification: Partial Thickness Partial Thickness N/A Exudate Amount: N/A Medium N/A Exudate Type: N/A Serous N/A Exudate Color: N/A amber N/A Wound Margin: N/A Flat and Intact N/A Granulation Amount: N/A Large (67-100%)  N/A Adam Merritt, Adam Merritt ([id number]) Granulation Quality: N/A Red N/A Necrotic Amount: N/A None Present (0%) N/A Epithelialization: N/A Large (67-100%) N/A Treatment Notes Electronic Signature(s) Signed: 11/28/2018 5:10:09 PM By: Montey Hora Entered By: Montey Hora on 11/28/2018 16:09:22 Adam Merritt ([id number]) -------------------------------------------------------------------------------- Simms Details Patient Name: Adam Merritt Date of Service: 11/28/2018 3:00 PM Medical Record Number: [id number] Patient Account Number: [id number] Date of Birth/Sex: 07-Nov-1945 (73 y.o. M) Treating RN: Montey Hora Primary Care Tamim Skog: [name] Other Clinician: Referring Naomee Nowland: [name] Treating Mette Southgate/Extender: Melburn Hake, [name]: 76 Active Inactive Abuse / Safety / Falls / Self Care Management Nursing Diagnoses: Potential for falls Goals: Patient will remain injury free related to falls Date Initiated: 04/09/2018 Target Resolution Date: 10/05/2018 Goal Status: Active Interventions: Assess fall risk on admission and as needed Notes: Venous Leg Ulcer Nursing Diagnoses: Actual venous Insuffiency (use after diagnosis is confirmed) Goals: Patient will maintain optimal edema control Date Initiated: 04/09/2018 Target Resolution Date: 10/05/2018 Goal Status: Active Interventions: Assess peripheral edema status every visit. Compression as ordered Provide education on venous insufficiency Notes: Electronic Signature(s) Signed: 11/28/2018 5:10:09 PM By: Montey Hora Entered By: Montey Hora on 11/28/2018 16:09:11 Adam Merritt ([id number]) -------------------------------------------------------------------------------- Pain Assessment Details Patient Name: Adam Merritt Date of Service: 11/28/2018 3:00 PM Medical Record Number: [id number] Patient Account Number: [id number] Date of Birth/Sex: Nov 28, 1945 (73 y.o. M) Treating RN:  Army Melia Primary Care Hermela Hardt: [name] Other Clinician: Referring Jaonna Word: [name] Treating Zoran Yankee/Extender: Melburn Hake, [name]: 11 Active Problems Location of Pain Severity and Description of Pain Patient Has Paino No Site Locations Pain Management and Medication Current Pain Management: Electronic Signature(s) Signed: 11/28/2018 4:04:47 PM By: Paulla Fore, RRT, CHT Signed: 11/28/2018 4:27:33 PM By: Army Melia Entered By: Lorine Bears on 11/28/2018 15:45:55 Adam Merritt ([id number]) -------------------------------------------------------------------------------- Patient/Caregiver Education Details Patient Name: Adam Merritt Date of Service: 11/28/2018 3:00 PM Medical Record Number: [id number] Patient Account Number: [id number] Date of Birth/Gender: 03/02/1946 (73 y.o. M) Treating RN: Montey Hora Primary Care Physician: [name] Other Clinician: Referring Physician: [name] Treating Physician/Extender: Sharalyn Ink in Treatment: 79 Education Assessment Education Provided To: Patient Education Topics Provided Venous: Handouts: Other: ongoing compression Methods: Explain/Verbal Responses: State content correctly Electronic Signature(s) Signed: 11/28/2018 5:10:09 PM By: Montey Hora Entered By: Montey Hora on 11/28/2018 16:13:25 Adam Merritt ([id number]) -------------------------------------------------------------------------------- Wound Assessment Details Patient Name: Adam Merritt Date of Service: 11/28/2018 3:00 PM Medical Record Number:  [id number] Patient Account Number: [id number] Date of Birth/Sex: 07-May-1946 (73 y.o. M) Treating RN: Montey Hora Primary Care Eusevio Schriver: [name] Other Clinician: Referring Tayana Shankle: [name] Treating Jissell Trafton/Extender: STONE III, [name]: 33 Wound Status Wound Number: 22 Primary Etiology: Lymphedema Wound  Location: Left, Circumferential Lower Leg Wound Status: Open Wounding Event: Gradually Appeared Date Acquired: 10/25/2018 Weeks Of Treatment: 4 Clustered Wound: No Wound Measurements Length: (cm) 0 % Re Width: (cm) 0 % Re Depth: (cm) 0 Area: (cm) 0 Volume: (cm) 0 duction in Area: 100% duction in Volume: 100% Wound Description Classification: Partial Thickness Electronic Signature(s) Signed: 11/28/2018 5:10:09 PM By: Montey Hora Entered By: Montey Hora on 11/28/2018 15:57:21 Adam Merritt ([id number]) -------------------------------------------------------------------------------- Wound Assessment Details Patient Name: Adam Merritt Date of Service: 11/28/2018 3:00 PM Medical Record Number: [id number] Patient Account Number: [id number] Date of Birth/Sex: 1945/09/25 (73 y.o. M) Treating RN: Montey Hora Primary Care Cing : [name] Other Clinician: Referring Aikam Hellickson: [name] Treating Jhoel Stieg/Extender: Melburn Hake, [name]: 33 Wound Status Wound Number: 23 Primary Lymphedema Etiology: Wound Location: Right Lower Leg - Lateral Wound Open Wounding Event: Gradually Appeared Status: Date Acquired: 11/28/2018 Comorbid Anemia, Lymphedema, Chronic Obstructive Weeks Of Treatment: 0 History: Pulmonary Disease (COPD), Sleep Apnea, Clustered Wound: No Congestive Heart Failure, Coronary Artery Disease, Hypertension, Peripheral Venous Disease, Type II Diabetes, Osteoarthritis, Neuropathy, Received Chemotherapy Photos Wound Measurements Length: (cm) 1 % Reduction in Width: (cm) 1.8 % Reduction in Depth: (cm) 0.1 Epithelializat Area: (cm) 1.414 Tunneling: Volume: (cm) 0.141 Undermining: Area: Volume: ion: Large (67-100%) No No Wound Description Classification: Partial Thickness Foul Odor Aft Wound Margin: Flat and Intact Slough/Fibrin Exudate Amount: Medium Exudate Type: Serous Exudate Color: amber er Cleansing: No o No Wound  Bed Granulation Amount: Large (67-100%) Exposed Structure Granulation Quality: Red Fascia Exposed: No Necrotic Amount: None Present (0%) Fat Layer (Subcutaneous Tissue) Exposed: No Tendon Exposed: No Muscle Exposed: No Joint Exposed: No Bone Exposed: No Adam Merritt, Adam Merritt ([id number]) Limited to Skin Breakdown Treatment Notes Wound #23 (Right, Lateral Lower Leg) Notes non adherent pad, abd, 4 layer wrap bilateral Electronic Signature(s) Signed: 11/28/2018 5:10:09 PM By: Montey Hora Entered By: Montey Hora on 11/28/2018 16:03:04 Adam Merritt ([id number]) -------------------------------------------------------------------------------- Vitals Details Patient Name: Adam Merritt Date of Service: 11/28/2018 3:00 PM Medical Record Number: [id number] Patient Account Number: [id number] Date of Birth/Sex: Jan 30, 1946 (73 y.o. M) Treating RN: Army Melia Primary Care Jermika Olden: [name] Other Clinician: Referring Jamison Soward: [name] Treating Barak Bialecki/Extender: Melburn Hake, [name]: 24 Vital Signs Time Taken: 15:46 Temperature (F): 98.6 Height (in): 66 Pulse (bpm): 62 Weight (lbs): 323 Respiratory Rate (breaths/min): 18 Body Mass Index (BMI): 52.1 Blood Pressure (mmHg): 126/64 Reference Range: 80 - 120 mg / dl Electronic Signature(s) Signed: 11/28/2018 4:04:47 PM By: Lorine Bears RCP, RRT, CHT Entered By: Lorine Bears on 11/28/2018 15:54:11

## 2018-12-02 ENCOUNTER — Other Ambulatory Visit: Payer: Self-pay

## 2018-12-02 DIAGNOSIS — E11621 Type 2 diabetes mellitus with foot ulcer: Secondary | ICD-10-CM | POA: Diagnosis not present

## 2018-12-02 NOTE — Progress Notes (Signed)
Adam Merritt ([id number]) Visit Report for 12/02/2018 Arrival Information Details Patient Name: Adam Merritt Date of Service: 12/02/2018 1:30 PM Medical Record Number: [id number] Patient Account Number: [id number] Date of Birth/Sex: Mar 22, 1946 (73 y.o. M) Treating RN: Army Melia Primary Care Tymeshia Awan: [name] Other Clinician: Referring Koua Deeg: [name] Treating Jashayla Glatfelter/Extender: Melburn Hake, [name]: 39 Visit Information History Since Last Visit Added or deleted any medications: No Patient Arrived: Walker Any new allergies or adverse reactions: No Arrival Time: 13:27 Had a fall or experienced change in No Accompanied By: self activities of daily living that may affect Transfer Assistance: None risk of falls: Patient Has Alerts: Yes Signs or symptoms of abuse/neglect since last visito No Patient Alerts: Patient on Blood Thinner Hospitalized since last visit: No Aspirin 325mg  Has Dressing in Place as Prescribed: Yes Type II Diabetes ABI 5/19 L 1.1 R 0.9 Pain Present Now: No TBI L 0.6 R 0.6 Electronic Signature(s) Signed: 12/02/2018 3:40:05 PM By: Army Melia Entered By: Army Melia on 12/02/2018 13:27:35 Adam Merritt ([id number]) -------------------------------------------------------------------------------- Compression Therapy Details Patient Name: Adam Merritt Date of Service: 12/02/2018 1:30 PM Medical Record Number: [id number] Patient Account Number: [id number] Date of Birth/Sex: 07/19/1945 (73 y.o. M) Treating RN: Army Melia Primary Care Clarrissa Shimkus: [name] Other Clinician: Referring Merton Wadlow: [name] Treating Goldie Dimmer/Extender: Melburn Hake, [name]: 33 Compression Therapy Performed for Wound Assessment: Wound #23 Right,Lateral Lower Leg Performed By: Clinician Army Melia, RN Compression Type: Four Layer Pre Treatment ABI: 0.9 Electronic Signature(s) Signed: 12/02/2018 3:40:05 PM By: Army Melia Entered By:  Army Melia on 12/02/2018 13:52:21 Adam Merritt ([id number]) -------------------------------------------------------------------------------- Compression Therapy Details Patient Name: Adam Merritt Date of Service: 12/02/2018 1:30 PM Medical Record Number: [id number] Patient Account Number: [id number] Date of Birth/Sex: [birth year] (73 y.o. M) Treating RN: Army Melia Primary Care Javelle Donigan: [name] Other Clinician: Referring Nastasia Kage: [name] Treating Waunetta Riggle/Extender: STONE III, [name]: 33 Compression Therapy Performed for Wound Assessment: NonWound Condition Lymphedema - Bilateral Leg Performed By: Clinician Army Melia, RN Compression Type: Four Layer Pre Treatment ABI: 0.9 Electronic Signature(s) Signed: 12/02/2018 3:40:05 PM By: Army Melia Entered By: Army Melia on 12/02/2018 13:52:44 Adam Merritt ([id number]) -------------------------------------------------------------------------------- Encounter Discharge Information Details Patient Name: Adam Merritt Date of Service: 12/02/2018 1:30 PM Medical Record Number: [id number] Patient Account Number: [id number] Date of Birth/Sex: 04/07/1946 (73 y.o. M) Treating RN: Army Melia Primary Care Dontavian Marchi: [name] Other Clinician: Referring Matthieu Loftus: [name] Treating Yehudah Standing/Extender: Melburn Hake, [name]: 61 Encounter Discharge Information Items Discharge Condition: Stable Ambulatory Status: Walker Discharge Destination: Home Transportation: Private Auto Accompanied By: self Schedule Follow-up Appointment: Yes Clinical Summary of Care: Electronic Signature(s) Signed: 12/02/2018 3:40:05 PM By: Army Melia Entered By: Army Melia on 12/02/2018 13:53:55 Adam Merritt ([id number]) -------------------------------------------------------------------------------- Wound Assessment Details Patient Name: Adam Merritt Date of Service: 12/02/2018 1:30 PM Medical Record  Number: [id number] Patient Account Number: [id number] Date of Birth/Sex: 12/04/1945 (73 y.o. M) Treating RN: Army Melia Primary Care Lakeia Bradshaw: [name] Other Clinician: Referring Keyatta Tolles: [name] Treating Pocahontas Cohenour/Extender: STONE III, [name]: 33 Wound Status Wound Number: 23 Primary Lymphedema Etiology: Wound Location: Right Lower Leg - Lateral Wound Open Wounding Event: Gradually Appeared Status: Date Acquired: 11/28/2018 Comorbid Anemia, Lymphedema, Chronic Obstructive Weeks Of Treatment: 0 History: Pulmonary Disease (COPD), Sleep Apnea, Clustered Wound: No Congestive Heart Failure, Coronary Artery Disease, Hypertension, Peripheral Venous Disease, Type II Diabetes, Osteoarthritis, Neuropathy, Received Chemotherapy Photos Wound  Measurements Length: (cm) 0.1 % Reduction in Width: (cm) 0.1 % Reduction in Depth: (cm) 0.1 Epithelializat Area: (cm) 0.008 Tunneling: Volume: (cm) 0.001 Undermining: Area: 99.4% Volume: 99.3% ion: Large (67-100%) No No Wound Description Classification: Partial Thickness Foul Odor Aft Wound Margin: Flat and Intact Slough/Fibrin Exudate Amount: Medium Exudate Type: Serous Exudate Color: amber er Cleansing: No o No Wound Bed Granulation Amount: Large (67-100%) Exposed Structure Granulation Quality: Red Fascia Exposed: No Necrotic Amount: None Present (0%) Fat Layer (Subcutaneous Tissue) Exposed: No Tendon Exposed: No Muscle Exposed: No Joint Exposed: No Bone Exposed: No Adam Merritt ([id number]) Limited to Skin Breakdown Treatment Notes Wound #23 (Right, Lateral Lower Leg) Notes non adherent pad, abd, 4 layer wrap bilateral Electronic Signature(s) Signed: 12/02/2018 3:40:05 PM By: Army Melia Entered By: Army Melia on 12/02/2018 13:35:31

## 2018-12-05 ENCOUNTER — Encounter: Payer: Medicare Other | Attending: Physician Assistant

## 2018-12-05 ENCOUNTER — Other Ambulatory Visit: Payer: Self-pay

## 2018-12-05 DIAGNOSIS — I509 Heart failure, unspecified: Secondary | ICD-10-CM | POA: Insufficient documentation

## 2018-12-05 DIAGNOSIS — I2510 Atherosclerotic heart disease of native coronary artery without angina pectoris: Secondary | ICD-10-CM | POA: Diagnosis not present

## 2018-12-05 DIAGNOSIS — Z9221 Personal history of antineoplastic chemotherapy: Secondary | ICD-10-CM | POA: Insufficient documentation

## 2018-12-05 DIAGNOSIS — Z872 Personal history of diseases of the skin and subcutaneous tissue: Secondary | ICD-10-CM | POA: Insufficient documentation

## 2018-12-05 DIAGNOSIS — E1140 Type 2 diabetes mellitus with diabetic neuropathy, unspecified: Secondary | ICD-10-CM | POA: Diagnosis not present

## 2018-12-05 DIAGNOSIS — Z8673 Personal history of transient ischemic attack (TIA), and cerebral infarction without residual deficits: Secondary | ICD-10-CM | POA: Insufficient documentation

## 2018-12-05 DIAGNOSIS — J449 Chronic obstructive pulmonary disease, unspecified: Secondary | ICD-10-CM | POA: Diagnosis not present

## 2018-12-05 DIAGNOSIS — Z09 Encounter for follow-up examination after completed treatment for conditions other than malignant neoplasm: Secondary | ICD-10-CM | POA: Diagnosis present

## 2018-12-05 DIAGNOSIS — I110 Hypertensive heart disease with heart failure: Secondary | ICD-10-CM | POA: Insufficient documentation

## 2018-12-05 DIAGNOSIS — G4730 Sleep apnea, unspecified: Secondary | ICD-10-CM | POA: Diagnosis not present

## 2018-12-05 NOTE — Progress Notes (Addendum)
DARELL, SAPUTO ([id number]) Visit Report for 12/05/2018 Arrival Information Details Patient Name: Adam Merritt, Adam Merritt Date of Service: 12/05/2018 8:45 AM Medical Record Number: [id number] Patient Account Number: [id number] Date of Birth/Sex: 1946/01/31 (73 y.o. M) Treating RN: Harold Barban Primary Care Valari : [name] Other Clinician: Referring Maude Hettich: [name] Treating Lavida Patch/Extender: Melburn Hake, [name]: 75 Visit Information History Since Last Visit Added or deleted any medications: No Patient Arrived: Walker Any new allergies or adverse reactions: No Arrival Time: 08:51 Had a fall or experienced change in No Accompanied By: self activities of daily living that may affect Transfer Assistance: None risk of falls: Patient Identification Verified: Yes Signs or symptoms of abuse/neglect since last visito No Secondary Verification Process Yes Hospitalized since last visit: No Completed: Has Dressing in Place as Prescribed: Yes Patient Has Alerts: Yes Has Compression in Place as Prescribed: Yes Patient Alerts: Patient on Blood Pain Present Now: No Thinner Aspirin 325mg  Type II Diabetes ABI 5/19 L 1.1 R 0.9 TBI L 0.6 R 0.6 Electronic Signature(s) Signed: 12/05/2018 9:30:21 AM By: Harold Barban Entered By: Harold Barban on 12/05/2018 09:30:20 Adam Merritt ([id number]) -------------------------------------------------------------------------------- Clinic Level of Care Assessment Details Patient Name: Adam Merritt Date of Service: 12/05/2018 8:45 AM Medical Record Number: [id number] Patient Account Number: [id number] Date of Birth/Sex: 1945/08/05 (73 y.o. M) Treating RN: Harold Barban Primary Care Kreed Kauffman: [name] Other Clinician: Referring Aurelia Gras: [name] Treating Crystie Yanko/Extender: Melburn Hake, [name]: 34 Clinic Level of Care Assessment Items TOOL 3 Quantity Score []  - Use when EandM and Procedure is performed on  FOLLOW-UP visit 0 ASSESSMENTS - Nursing Assessment / Reassessment X - Reassessment of Co-morbidities (includes updates in patient status) 1 10 X- 1 5 Reassessment of Adherence to Treatment Plan ASSESSMENTS - Wound and Skin Assessment / Reassessment []  - Points for Wound Assessment can only be taken for a new wound of unknown or different 0 etiology and a procedure is NOT performed to that wound X- 1 5 Simple Wound Assessment / Reassessment - one wound []  - 0 Complex Wound Assessment / Reassessment - multiple wounds []  - 0 Dermatologic / Skin Assessment (not related to wound area) ASSESSMENTS - Focused Assessment []  - Circumferential Edema Measurements - multi extremities 0 []  - 0 Nutritional Assessment / Counseling / Intervention []  - 0 Lower Extremity Assessment (monofilament, tuning fork, pulses) []  - 0 Peripheral Arterial Disease Assessment (using hand held doppler) ASSESSMENTS - Ostomy and/or Continence Assessment and Care []  - Incontinence Assessment and Management 0 []  - 0 Ostomy Care Assessment and Management (repouching, etc.) PROCESS - Coordination of Care []  - Points for Discharge Coordination can only be taken for a new wound of unknown or 0 different etiology and a procedure is NOT performed to that wound X- 1 15 Simple Patient / Family Education for ongoing care []  - 0 Complex (extensive) Patient / Family Education for ongoing care []  - 0 Staff obtains Programmer, systems, Records, Test Results / Process Orders []  - 0 Staff telephones HHA, Nursing Homes / Clarify orders / etc []  - 0 Routine Transfer to another Facility (non-emergent condition) []  - 0 Routine Hospital Admission (non-emergent condition) Adam Merritt, Adam Merritt ([id number]) []  - 0 New Admissions / Biomedical engineer / Ordering NPWT, Apligraf, etc. []  - 0 Emergency Hospital Admission (emergent condition) X- 1 10 Simple Discharge Coordination []  - 0 Complex (extensive) Discharge Coordination PROCESS -  Special Needs []  - Pediatric / Minor Patient Management 0 []  - 0 Isolation Patient  Management []  - 0 Hearing / Language / Visual special needs []  - 0 Assessment of Community assistance (transportation, D/C planning, etc.) []  - 0 Additional assistance / Altered mentation []  - 0 Support Surface(s) Assessment (bed, cushion, seat, etc.) INTERVENTIONS - Wound Cleansing / Measurement []  - Points for Wound Cleaning / Measurement, Wound Dressing, Specimen Collection and 0 Specimen taken to lab can only be taken for a new wound of unknown or different etiology and a procedure is NOT performed to that wound X- 1 5 Simple Wound Cleansing - one wound []  - 0 Complex Wound Cleansing - multiple wounds X- 1 5 Wound Imaging (photographs - any number of wounds) []  - 0 Wound Tracing (instead of photographs) X- 1 5 Simple Wound Measurement - one wound []  - 0 Complex Wound Measurement - multiple wounds INTERVENTIONS - Wound Dressings X - Small Wound Dressing one or multiple wounds 1 10 []  - 0 Medium Wound Dressing one or multiple wounds []  - 0 Large Wound Dressing one or multiple wounds INTERVENTIONS - Miscellaneous []  - External ear exam 0 []  - 0 Specimen Collection (cultures, biopsies, blood, body fluids, etc.) []  - 0 Specimen(s) / Culture(s) sent or taken to Lab for analysis []  - 0 Patient Transfer (multiple staff / Harrel Lemon Lift / Similar devices) []  - 0 Simple Staple / Suture removal (25 or less) []  - 0 Complex Staple / Suture removal (26 or more) Adam Merritt, Adam Merritt. ([id number]) []  - 0 Hypo / Hyperglycemic Management (close monitor of Blood Glucose) []  - 0 Ankle / Brachial Index (ABI) - do not check if billed separately X- 1 5 Vital Signs Has the patient been seen at the hospital within the last three years: Yes Total Score: 75 Level Of Care: New/Established - Level 2 Electronic Signature(s) Signed: 12/05/2018 9:59:53 AM By: Harold Barban Entered By: Harold Barban on 12/05/2018  09:56:40 Adam Merritt ([id number]) -------------------------------------------------------------------------------- Compression Therapy Details Patient Name: Adam Merritt Date of Service: 12/05/2018 8:45 AM Medical Record Number: [id number] Patient Account Number: [id number] Date of Birth/Sex: 1946-02-23 (73 y.o. M) Treating RN: Harold Barban Primary Care Sofiah Lyne: [name] Other Clinician: Referring Zemira Zehring: [name] Treating Montavis Schubring/Extender: Melburn Hake, [name]: 34 Compression Therapy Performed for Wound Assessment: Wound #23 Right,Lateral Lower Leg Performed By: Clinician Harold Barban, RN Compression Type: Four Layer Electronic Signature(s) Signed: 12/05/2018 9:55:10 AM By: Harold Barban Entered By: Harold Barban on 12/05/2018 09:55:10 Adam Merritt ([id number]) -------------------------------------------------------------------------------- Encounter Discharge Information Details Patient Name: Adam Merritt Date of Service: 12/05/2018 8:45 AM Medical Record Number: [id number] Patient Account Number: [id number] Date of Birth/Sex: 11/02/1945 (73 y.o. M) Treating RN: Harold Barban Primary Care Elishia Kaczorowski: [name] Other Clinician: Referring Derrich Gaby: [name] Treating Cranston Koors/Extender: Melburn Hake, [name]: 38 Encounter Discharge Information Items Discharge Condition: Stable Ambulatory Status: Walker Discharge Destination: Home Transportation: Private Auto Accompanied By: self Schedule Follow-up Appointment: Yes Clinical Summary of Care: Electronic Signature(s) Signed: 12/05/2018 9:56:08 AM By: Harold Barban Entered By: Harold Barban on 12/05/2018 09:56:08 Adam Merritt ([id number]) -------------------------------------------------------------------------------- Wound Assessment Details Patient Name: Adam Merritt Date of Service: 12/05/2018 8:45 AM Medical Record Number: [id number] Patient Account Number:  [id number] Date of Birth/Sex: [birth year] (73 y.o. M) Treating RN: Harold Barban Primary Care Yasseen Salls: [name] Other Clinician: Referring Kadia Abaya: [name] Treating Johan Creveling/Extender: STONE III, [name]: 34 Wound Status Wound Number: 23 Primary Etiology: Lymphedema Wound Location: Right, Lateral Lower Leg Wound Status: Open Wounding Event: Gradually Appeared Date Acquired:  11/28/2018 Weeks Of Treatment: 1 Clustered Wound: No Wound Measurements Length: (cm) 0.1 Width: (cm) 0.1 Depth: (cm) 0.1 Area: (cm) 0.008 Volume: (cm) 0.001 % Reduction in Area: 99.4% % Reduction in Volume: 99.3% Wound Description Classification: Partial Thickness Treatment Notes Wound #23 (Right, Lateral Lower Leg) Notes non adherent pad, abd, 4 layer wrap bilateral Electronic Signature(s) Signed: 12/05/2018 9:59:53 AM By: Harold Barban Entered By: Harold Barban on 12/05/2018 09:30:30

## 2018-12-09 ENCOUNTER — Encounter: Payer: Medicare Other | Admitting: Physician Assistant

## 2018-12-09 ENCOUNTER — Other Ambulatory Visit: Payer: Self-pay

## 2018-12-09 DIAGNOSIS — Z09 Encounter for follow-up examination after completed treatment for conditions other than malignant neoplasm: Secondary | ICD-10-CM | POA: Diagnosis not present

## 2018-12-12 NOTE — Progress Notes (Signed)
GRANVILLE, WHITEFIELD ([id number]) Visit Report for 12/09/2018 Arrival Information Details Patient Name: NALIN, MAZZOCCO Date of Service: 12/09/2018 1:45 PM Medical Record Number: [id number] Patient Account Number: [id number] Date of Birth/Sex: 1945/06/16 (73 y.o. M) Treating RN: Montey Hora Primary Care Avrey Hyser: [name] Other Clinician: Referring Platon Arocho: [name] Treating Zeynab Klett/Extender: Melburn Hake, [name]: 23 Visit Information History Since Last Visit Added or deleted any medications: No Patient Arrived: Walker Any new allergies or adverse reactions: No Arrival Time: 14:03 Had a fall or experienced change in No Accompanied By: self activities of daily living that may affect Transfer Assistance: None risk of falls: Patient Identification Verified: Yes Signs or symptoms of abuse/neglect since last visito No Secondary Verification Process Yes Hospitalized since last visit: No Completed: Implantable device outside of the clinic excluding No Patient Has Alerts: Yes cellular tissue based products placed in the center Patient Alerts: Patient on Blood since last visit: Thinner Has Dressing in Place as Prescribed: Yes Aspirin 325mg  Has Compression in Place as Prescribed: Yes Type II Diabetes ABI 5/19 L 1.1 R 0.9 Pain Present Now: No TBI L 0.6 R 0.6 Electronic Signature(s) Signed: 12/09/2018 3:32:36 PM By: Montey Hora Entered By: Montey Hora on 12/09/2018 14:05:00 [name] ([id number]) -------------------------------------------------------------------------------- Clinic Level of Care Assessment Details Patient Name: [name] Date of Service: 12/09/2018 1:45 PM Medical Record Number: [id number] Patient Account Number: [id number] Date of Birth/Sex: April 18, 1946 (73 y.o. M) Treating RN: Harold Barban Primary Care Kera Deacon: [name] Other Clinician: Referring Julianna Vanwagner: [name] Treating Elana Jian/Extender: Melburn Hake, [name] in  Treatment: 34 Clinic Level of Care Assessment Items TOOL 4 Quantity Score []  - Use when only an EandM is performed on FOLLOW-UP visit 0 ASSESSMENTS - Nursing Assessment / Reassessment X - Reassessment of Co-morbidities (includes updates in patient status) 1 10 X- 1 5 Reassessment of Adherence to Treatment Plan ASSESSMENTS - Wound and Skin Assessment / Reassessment X - Simple Wound Assessment / Reassessment - one wound 1 5 []  - 0 Complex Wound Assessment / Reassessment - multiple wounds []  - 0 Dermatologic / Skin Assessment (not related to wound area) ASSESSMENTS - Focused Assessment []  - Circumferential Edema Measurements - multi extremities 0 []  - 0 Nutritional Assessment / Counseling / Intervention []  - 0 Lower Extremity Assessment (monofilament, tuning fork, pulses) []  - 0 Peripheral Arterial Disease Assessment (using hand held doppler) ASSESSMENTS - Ostomy and/or Continence Assessment and Care []  - Incontinence Assessment and Management 0 []  - 0 Ostomy Care Assessment and Management (repouching, etc.) PROCESS - Coordination of Care X - Simple Patient / Family Education for ongoing care 1 15 []  - 0 Complex (extensive) Patient / Family Education for ongoing care []  - 0 Staff obtains Programmer, systems, Records, Test Results / Process Orders []  - 0 Staff telephones HHA, Nursing Homes / Clarify orders / etc []  - 0 Routine Transfer to another Facility (non-emergent condition) []  - 0 Routine Hospital Admission (non-emergent condition) []  - 0 New Admissions / Biomedical engineer / Ordering NPWT, Apligraf, etc. []  - 0 Emergency Hospital Admission (emergent condition) X- 1 10 Simple Discharge Coordination NICHOLAI, WILLETTE ([id number]) []  - 0 Complex (extensive) Discharge Coordination PROCESS - Special Needs []  - Pediatric / Minor Patient Management 0 []  - 0 Isolation Patient Management []  - 0 Hearing / Language / Visual special needs []  - 0 Assessment of Community assistance  (transportation, D/C planning, etc.) []  - 0 Additional assistance / Altered mentation []  - 0 Support Surface(s) Assessment (bed,  cushion, seat, etc.) INTERVENTIONS - Wound Cleansing / Measurement X - Simple Wound Cleansing - one wound 1 5 []  - 0 Complex Wound Cleansing - multiple wounds []  - 0 Wound Imaging (photographs - any number of wounds) []  - 0 Wound Tracing (instead of photographs) X- 1 5 Simple Wound Measurement - one wound []  - 0 Complex Wound Measurement - multiple wounds INTERVENTIONS - Wound Dressings X - Small Wound Dressing one or multiple wounds 1 10 []  - 0 Medium Wound Dressing one or multiple wounds []  - 0 Large Wound Dressing one or multiple wounds []  - 0 Application of Medications - topical []  - 0 Application of Medications - injection INTERVENTIONS - Miscellaneous []  - External ear exam 0 []  - 0 Specimen Collection (cultures, biopsies, blood, body fluids, etc.) []  - 0 Specimen(s) / Culture(s) sent or taken to Lab for analysis []  - 0 Patient Transfer (multiple staff / [employer] fast streamer / Similar devices) []  - 0 Simple Staple / Suture removal (25 or less) []  - 0 Complex Staple / Suture removal (26 or more) []  - 0 Hypo / Hyperglycemic Management (close monitor of Blood Glucose) []  - 0 Ankle / Brachial Index (ABI) - do not check if billed separately X- 1 5 Vital Signs JAVONE, YBANEZ ([id number]) Has the patient been seen at the hospital within the last three years: Yes Total Score: 70 Level Of Care: New/Established - Level 2 Electronic Signature(s) Signed: 12/09/2018 4:36:57 PM By: Harold Barban Entered By: Harold Barban on 12/09/2018 14:40:54 [name] ([id number]) -------------------------------------------------------------------------------- Encounter Discharge Information Details Patient Name: [name] Date of Service: 12/09/2018 1:45 PM Medical Record Number: [id number] Patient Account Number: [id number] Date of Birth/Sex: December 14, 1945  (73 y.o. M) Treating RN: Harold Barban Primary Care Raysa Bosak: [name] Other Clinician: Referring Bronnie Vasseur: [name] Treating Markeshia Giebel/Extender: Melburn Hake, [name]: 18 Encounter Discharge Information Items Discharge Condition: Stable Ambulatory Status: Walker Discharge Destination: Home Transportation: Private Auto Accompanied By: wife Schedule Follow-up Appointment: Yes Clinical Summary of Care: Electronic Signature(s) Signed: 12/09/2018 4:36:57 PM By: Harold Barban Entered By: Harold Barban on 12/09/2018 14:42:34 [name] ([id number]) -------------------------------------------------------------------------------- Lower Extremity Assessment Details Patient Name: [name] Date of Service: 12/09/2018 1:45 PM Medical Record Number: [id number] Patient Account Number: [id number] Date of Birth/Sex: 06/26/1945 (73 y.o. M) Treating RN: Montey Hora Primary Care Andon Villard: [name] Other Clinician: Referring Alexcia Schools: [name] Treating Naturi Alarid/Extender: Melburn Hake, [name]: 34 Edema Assessment Assessed: [Left: No] [Right: No] Edema: [Left: Yes] [Right: Yes] Calf Left: Right: Point of Measurement: 37 cm From Medial Instep 57.5 cm 50.5 cm Ankle Left: Right: Point of Measurement: 12 cm From Medial Instep 36 cm 27.5 cm Vascular Assessment Pulses: Dorsalis Pedis Palpable: [Left:Yes] [Right:Yes] Electronic Signature(s) Signed: 12/09/2018 3:32:36 PM By: Montey Hora Entered By: Montey Hora on 12/09/2018 14:13:29 [name] ([id number]) -------------------------------------------------------------------------------- Multi Wound Chart Details Patient Name: [name] Date of Service: 12/09/2018 1:45 PM Medical Record Number: [id number] Patient Account Number: [id number] Date of Birth/Sex: May 18, 1946 (73 y.o. M) Treating RN: Harold Barban Primary Care Griselda Bramblett: [name] Other Clinician: Referring Banita Lehn:  [name] Treating Gaylin Bulthuis/Extender: Melburn Hake, [name]: 34 Vital Signs Height(in): 66 Pulse(bpm): 78 Weight(lbs): 323 Blood Pressure(mmHg): 137/62 Body Mass Index(BMI): 52 Temperature(F): 98.7 Respiratory Rate 20 (breaths/min): Photos: [23:No Photos] [N/A:N/A] Wound Location: [23:Right, Lateral Lower Leg] [N/A:N/A] Wounding Event: [23:Gradually Appeared] [N/A:N/A] Primary Etiology: [23:Lymphedema] [N/A:N/A] Date Acquired: [23:11/28/2018] [N/A:N/A] Weeks of Treatment: [23:1] [N/A:N/A] Wound Status: [  23:Healed - Epithelialized] [N/A:N/A] Measurements L x W x D [23:0x0x0] [N/A:N/A] (cm) Area (cm) : [23:0] [N/A:N/A] Volume (cm) : [23:0] [N/A:N/A] % Reduction in Area: [23:100.00%] [N/A:N/A] % Reduction in Volume: [23:100.00% Partial Thickness] [N/A:N/A N/A] Treatment Notes Electronic Signature(s) Signed: 12/09/2018 4:36:57 PM By: Harold Barban Entered By: Harold Barban on 12/09/2018 14:34:54 [name] ([id number]) -------------------------------------------------------------------------------- [place] Details Patient Name: [name] Date of Service: 12/09/2018 1:45 PM Medical Record Number: [id number] Patient Account Number: [id number] Date of Birth/Sex: 1946/06/02 (73 y.o. M) Treating RN: Harold Barban Primary Care Alayne Estrella: [name] Other Clinician: Referring Anjalina Bergevin: [name] Treating Jasnoor Trussell/Extender: Melburn Hake, [name]: 4 Active Inactive Electronic Signature(s) Signed: 12/09/2018 4:36:57 PM By: Harold Barban Entered By: Harold Barban on 12/09/2018 14:34:30 [name] ([id number]) -------------------------------------------------------------------------------- Pain Assessment Details Patient Name: [name] Date of Service: 12/09/2018 1:45 PM Medical Record Number: [id number] Patient Account Number: [id number] Date of Birth/Sex: 01-Aug-1945 (73 y.o. M) Treating RN: Montey Hora Primary Care Hosteen Kienast: [name] Other Clinician: Referring Damondre Pfeifle: [name] Treating Imari Sivertsen/Extender: Melburn Hake, [name]: 40 Active Problems Location of Pain Severity and Description of Pain Patient Has Paino No Site Locations Pain Management and Medication Current Pain Management: Electronic Signature(s) Signed: 12/09/2018 3:32:36 PM By: Montey Hora Entered By: Montey Hora on 12/09/2018 14:05:07 [name] ([id number]) -------------------------------------------------------------------------------- Patient/Caregiver Education Details Patient Name: [name] Date of Service: 12/09/2018 1:45 PM Medical Record Number: [id number] Patient Account Number: [id number] Date of Birth/Gender: Jun 14, 1945 (73 y.o. M) Treating RN: Harold Barban Primary Care Physician: [name] Other Clinician: Referring Physician: [name] Treating Physician/Extender: Sharalyn Ink in Treatment: 76 Education Assessment Education Provided To: Patient Education Topics Provided Wound/Skin Impairment: Handouts: Caring for Your Ulcer Methods: Demonstration, Explain/Verbal Responses: State content correctly Electronic Signature(s) Signed: 12/09/2018 4:36:57 PM By: Harold Barban Entered By: Harold Barban on 12/09/2018 14:38:51 [name] ([id number]) -------------------------------------------------------------------------------- Wound Assessment Details Patient Name: [name] Date of Service: 12/09/2018 1:45 PM Medical Record Number: [id number] Patient Account Number: [id number] Date of Birth/Sex: 06-Jan-1946 (73 y.o. M) Treating RN: Montey Hora Primary Care Octave Montrose: [name] Other Clinician: Referring Aniella Wandrey: [name] Treating Xochil Shanker/Extender: STONE III, [name]: 34 Wound Status Wound Number: 23 Primary Etiology: Lymphedema Wound Location: Right, Lateral Lower Leg Wound Status: Healed -  Epithelialized Wounding Event: Gradually Appeared Date Acquired: 11/28/2018 Weeks Of Treatment: 1 Clustered Wound: No Photos Photo Uploaded By: Montey Hora on 12/09/2018 14:45:31 Wound Measurements Length: (cm) 0 Width: (cm) 0 Depth: (cm) 0 Area: (cm) 0 Volume: (cm) 0 % Reduction in Area: 100% % Reduction in Volume: 100% Wound Description Classification: Partial Thickness Electronic Signature(s) Signed: 12/09/2018 3:32:36 PM By: Montey Hora Entered By: Montey Hora on 12/09/2018 14:14:41 [name] ([id number]) -------------------------------------------------------------------------------- [place] Details Patient Name: [name] Date of Service: 12/09/2018 1:45 PM Medical Record Number: [id number] Patient Account Number: [id number] Date of Birth/Sex: Jul 04, 1945 (73 y.o. M) Treating RN: Montey Hora Primary Care Armani Brar: [name] Other Clinician: Referring Montez Stryker: [name] Treating Navaeh Kehres/Extender: Melburn Hake, [name]: 34 Vital Signs Time Taken: 14:05 Temperature (F): 98.7 Height (in): 66 Pulse (bpm): 78 Weight (lbs): 323 Respiratory Rate (breaths/min): 20 Body Mass Index (BMI): 52.1 Blood Pressure (mmHg): 137/62 Reference Range: 80 - 120 mg / dl Electronic Signature(s) Signed: 12/09/2018 3:32:36 PM By: Montey Hora Entered By: Montey Hora on 12/09/2018 14:06:38

## 2018-12-13 NOTE — Progress Notes (Signed)
Adam Merritt, Adam Merritt ([id number]) Visit Report for 12/09/2018 Chief Complaint Document Details Patient Name: Adam Merritt, Adam Merritt Date of Service: 12/09/2018 1:45 PM Medical Record Number: [id number] Patient Account Number: [id number] Date of Birth/Sex: Nov 04, 1945 (73 y.o. M) Treating RN: Harold Barban Primary Care Provider: [name] Other Clinician: Referring Provider: [name] Treating Provider/Extender: Melburn Hake, [name]: 10 Information Obtained from: Patient Chief Complaint he is here for evaluation of bilateral lower extremity lymphedema right right toe and left LE ulcers Electronic Signature(s) Signed: 12/11/2018 9:09:50 PM By: Worthy Keeler PA-C Entered By: Worthy Keeler on 12/09/2018 14:32:45 Adam Merritt ([id number]) -------------------------------------------------------------------------------- HPI Details Patient Name: Adam Merritt Date of Service: 12/09/2018 1:45 PM Medical Record Number: [id number] Patient Account Number: [id number] Date of Birth/Sex: [birth year] (73 y.o. M) Treating RN: Harold Barban Primary Care Provider: [name] Other Clinician: Referring Provider: [name] Treating Provider/Extender: Melburn Hake, [name]: 51 History of Present Illness HPI Description: 10/18/17-He is here for initial evaluation of bilateral lower extremity ulcerations in the presence of venous insufficiency and lymphedema. He has been seen by vascular medicine in the past, Dr. Lucky Cowboy, last seen in 2016. He does have a history of abnormal ABIs, which is to be expected given his lymphedema and venous insufficiency. According to Epic, it appears that all attempts for arterial evaluation and/or angiography were not follow through with by patient. He does have a history of being seen in lymphedema clinic in 2018, stopped going approximately 6 months ago stating "it didn't do any good". He does not have lymphedema pumps, he does not have custom fit  compression wrap/stockings. He is diabetic and his recent A1c last month was 7.6. He admits to chronic bilateral lower extremity pain, no change in pain since blister and ulceration development. He is currently being treated with Levaquin for bronchitis. He has home health and we will continue. 10/25/17-He is here in follow-up evaluation for bilateral lower extremity ulcerationssubtle he remains on Levaquin for bronchitis. Right lower extremity with no evidence of drainage or ulceration, persistent left lower extremity ulceration. He states that home health has not been out since his appointment. [facility], studies revealed: RIGHT ABI 0.9, TBI 0.6 LEFT ABI 1.1, TBI 0.6 with triphasic flow bilaterally. We will continue his same treatment plan. He has been educated on compression therapy and need for elevation. He will benefit from lymphedema pumps 11/01/17-He is here in follow-up evaluation for left lower extremity ulcer. The right lower extremity remains healed. He has home health services, but they have not been out to see the patient for 2-3 weeks. He states it home health physical therapy changed his dressing yesterday after therapy; he placed Ace wrap compression. We are still waiting for lymphedema pumps, reordered d/t need for company change. 11/08/17-He is here in follow-up evaluation for left lower extremity ulcer. It is improved. Edema is significantly improved with compression therapy. We will continue with same treatment plan and he will follow-up next week. No word regarding lymphedema pumps 11/15/17-He is here in follow-up evaluation for left lower extremity ulcer. He is healed and will be discharged from wound care services. I have reached out to [facility] regarding his lymphedema pumps. They have been unable to reach the patient; the contact number they had with the patient's wife's cell phone and she has not answered any unrecognized  calls. Contact should be made today, trial planned for next week; [facility] will continue to  follow 11/27/17 on evaluation today patient has multiple blistered areas over the right lower extremity his left lower extremity appears to be doing okay. These blistered areas show signs of no infection which is great news. With that being said he did have some necrotic skin overlying which was mechanically debrided away with saline and gauze today without complication. Overall post debridement the wounds appear to be doing better but in general his swelling seems to be increased. This is obviously not good news. I think this is what has given rise to the blisters. 12/04/17 on evaluation today patient presents for follow-up concerning his bilateral lower extremity edema in the right lower extremity ulcers. He has been tolerating the dressing changes without complication. With that being said he has had no real issues with the wraps which is also good news. Overall I'm pleased with the progress he's been making. 12/11/17 on evaluation today patient appears to be doing rather well in regard to his right lateral lower extremity ulcer. He's been tolerating the dressing changes without complication. Fortunately there does not appear to be any evidence of infection at this time. Overall I'm pleased with the progress that is being made. Unfortunately he has been in the hospital due to having what sounds to be a stomach virus/flu fortunately that is starting to get better. 12/18/17 on evaluation today patient actually appears to be doing very well in regard to his bilateral lower extremities the swelling is under fairly good control his lymphedema pumps are still not up and running quite yet. With that being said he does have several areas of opening noted as far as wounds are concerned mainly over the left lower extremity. With that being said I do believe once he gets lymphedema pumps this would least help you  mention some the fluid and preventing this from occurring. Hopefully that will be set up soon sleeves are Artie in place at his home he just waiting for the machine. 12/25/17 on evaluation today patient actually appears to be doing excellent in fact all of his ulcers appear to have resolved his BILL, YOHN. ([id number]) legs appear very well. I do think he needs compression stockings we have discussed this and they are actually going to go to [place] today to elastic therapy to get this fitted for him. I think that is definitely a good thing to do. Readmission: 04/09/18 upon evaluation today this patient is seen for readmission due to bilateral lower extremity lymphedema. He has significant swelling of his extremities especially on the left although the right is also swollen he has weeping from both sides. There are no obvious open wounds at this point. Fortunately he has been doing fairly well for quite a bit of time since I last saw him. Nonetheless unfortunately this seems to have reopened and is giving quite a bit of trouble. He states this began about a week ago when he first called [place] to get in to be seen. No fevers, chills, nausea, or vomiting noted at this time. He has not been using his lymphedema pumps due to the fact that they won't fit on his leg at this point likewise is also not been using his compression for essentially the same reason. 04/16/18 upon evaluation today patient actually appears to be doing a little better in regard to the fluid in his bilateral lower extremities. With that being said he's had three falls since I saw him last week. He also states that he's been feeling very poorly. I was  concerned last week and feel like that the concern is still there as far as the congestion in his chest is concerned he seems to be breathing about the same as last week but again he states he's very weak he's not even able to walk further than from the chair to the door. His wife had  to buy a wheelchair just to be able to get them out of the house to get to the appointment today. This has me very concerned. 04/23/18 on evaluation today patient actually appears to be doing much better than last week's evaluation. At that time actually had to transport him to the ER via EMS and he subsequently was admitted for acute pulmonary edema, acute renal failure, and acute congestive heart failure. Fortunately he is doing much better. Apparently they did dialyze him and were able to take off roughly 35 pounds of fluid. Nonetheless he is feeling much better both in regard to his breathing and he's able to get around much better at this time compared to previous. Overall I'm very happy with how things are at this time. There does not appear to be any evidence of infection currently. No fevers, chills, nausea, or vomiting noted at this time. 04/30/2018 patient seen today for follow-up and management of bilateral lower extremity lymphedema. He did express being more sad today than usual due to the recent loss of his dog. He states that he has been compliant with using the lymphedema pumps. However he does admit a minute over the last 2-3 days he has not been using the pumps due to the recent loss of his dog. At this time there is no drainage or open wounds to his lower extremities. The left leg edema is measuring smaller today. Still has a significant amount of edema on bilateral lower extremities With dry flaky skin. He will be referred to the lymphedema clinic for further management. Will continue 3 layer compression wraps and follow-up in 1-2 weeks.Denies any pain, fever, chairs recently. No recent falls or injuries reported during this visit. 05/07/18 on evaluation today patient actually appears to be doing very well in regard to his lower extremities in general all things considered. With that being said he is having some pain in the legs just due to the amount of swelling. He does have an  area where he had a blister on the left lateral lower extremity this is open at this point other than that there's nothing else weeping at this time. 05/14/18 on evaluation today patient actually appears to be doing excellent all things considered in regard to his lower extremities. He still has a couple areas of weeping on each leg which has continued to be the issue for him. He does have an appointment with the lymphedema clinic although this isn't until February 2020. That was the earliest they had. In the meantime he has continued to tolerate the compression wraps without complication. 05/28/18 on evaluation today patient actually appears to be doing more poorly in regard to his left lower extremity where he has a wound open at this point. He also had a fall where he subsequently injured his right great toe which has led to an open wound at the site unfortunately. He has been tolerating the dressing changes without complication in general as far as the wraps are concerned that he has not been putting any dressing on the left 1st toe ulcer site. 06/11/18 on evaluation today patient appears to be doing much worse in regard to his  bilateral lower extremity ulcers. He has been tolerating the dressing changes without complication although his legs have not been wrapped more recently. Overall I am not very pleased with the way his legs appear. I do believe he needs to be back in compression wraps he still has not received his compression wraps from the [facility] hospital as of yet. 06/18/18 on evaluation today patient actually appears to be doing significantly better than last time I saw him. He has been tolerating the compression wraps without complication in the circumferential ulcers especially appear to be doing much better. His toe ulcer on the right in regard to the great toe is better although not as good as the legs in my pinion. No fevers chills noted Adam Merritt, Adam Merritt ([id number]) 07/02/18 on evaluation  today patient appears to be doing much better in regard to his lower extremity ulcers. Unfortunately since I last saw him he's had the distal portion of his right great toe if he dated it sounds as if this actually went downhill very quickly. I had only seen him a few days prior and the toe did not appear to be infected at that point subsequently became infected very rapidly and it was decided by the surgeon that the distal portion of the toe needed to be removed. The patient seems to be doing well in this regard he tells me. With that being said his lower extremities are doing better from the standpoint of the wounds although he is significantly swollen at this point. 07/09/18 on evaluation today patient appears to be doing better in regard to the wounds on his lower extremities. In fact everything is almost completely healed he is just a small area on the left posterior lower extremity that is open at this point. 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 With that being said he needs some kind of compression it does sound like he may not be wearing his compression, that is the wraps, during the entire time between when he's here visit to visit. Apparently his wife took the current one off because it began to "fall apart". 07/16/18 on evaluation today patient appears to be extremely swollen especially in regard to his left lower extremity unfortunately. He also has a new skin tear over the left lower extremity and there's a smaller area on the right lower extremity as well. Unfortunately this seems to be due in part to blistering and fluid buildup in his leg. He did get the reduction wraps that were ordered by the [facility] hospital for him to go to lymphedema clinic. With that being said his wounds on the legs have not healed  to the point to where they would likely accept them as a patient lymphedema clinic currently. We need to try to get this to heal. With that being said he's been taking his wraps off which is not doing him any favors at this point. In fact this is probably quite counterproductive compared to what needs to occur. We will likely need to increase to a four layer compression wrap and continue to also utilize elevation and he has to keep the wraps on not take them off as he's been doing currently hasn't had a wrap on since Saturday. 07/23/18 on evaluation today patient appears to be doing much better in regard to his bilateral lower extremities.  In fact his left lower extremity which was the largest is actually 15 cm smaller today compared to what it was last time he was here in our clinic. This is obviously good news after just one week. Nonetheless the differences he actually kept the wraps on during the entire week this time. That's not typical for him. I do believe he understands a little bit better now the severity of the situation and why it's important for him to keep these wraps on. 07/30/18 on evaluation today patient actually appears to be doing rather well in regard to his lower extremities. His legs are much smaller than they have been in the past and he actually has only one very small rudder superficial region remaining that is not closed on the left lateral/posterior lower extremity even this is almost completely close. I do believe likely next week he will be healed without any complications. I do think we need to continue the wraps however this seems to be beneficial for him. I also think it may be a good time for [place] to go ahead and see about getting the appointment with the lymphedema clinic which is supposed to be made for him in order to keep this moving along and hopefully get them into compression wraps that will in the end help him to remain healed. 08/06/18 on evaluation today patient  actually appears to be doing very well in regard as bilateral lower extremities. In fact his wounds appear to be completely healed at this time. 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 He is in the process of getting appointment with the lymphedema clinic we have made this referral were just waiting to hear back on the schedule time. We need to follow up on that today as well. 08/13/18 on evaluation today patient actually appears to be doing very well in regard to his bilateral lower extremities there are no open wounds at this point. We are gonna go ahead and see about ordering the Velcro compression wraps for him had a discussion with them about [place] doing it versus the [place] they feel like they can definitely afford going ahead and get the wraps themselves and they would prefer to try to avoid having to go to the lymphedema clinic if it all possible which I completely understand. As long as he has good compression I'm okay either way. 3/17//20 on evaluation today patient actually appears to be doing well in regard to his bilateral lower extremity ulcers. He has been tolerating the dressing changes without complication specifically the compression wraps. Overall is had no issues my fingers finding that I see at this point is that he is having trouble with constipation. He tells me he has not been able to go to the bathroom for about six days. He's taken to over-the-counter oral laxatives unfortunately this is not helping. He has not contacted his doctor. 08/27/18 on evaluation today patient appears to be doing fairly well in regard to his lower extremities at this point. There does not appear to be any new altars is swelling is very well controlled. We are still waiting for his Velcro compression wraps to arrive that should be sometime in the next week is Artie sent the check for these. 09/03/18 on evaluation today patient appears to be doing  excellent in regard to his bilateral lower extremities which he shows Adam Merritt, Adam Merritt. ([id number]) no signs of wound openings in regard to this point. He does have  his Velcro compression wraps which did arrive in the mail since I saw him last week. Overall he is doing excellent in my pinion. 09/13/18 on evaluation today patient appears to be doing very well currently in regard to the overall appearance of his bilateral lower extremities although he's a little bit more swollen than last time we saw him. At that point he been discharged without any open wounds. Nonetheless he has a small open wound on the posterior left lower extremity with some evidence of cellulitis noted as well. Fortunately I feel like he has made good progress overall with regard to his lower extremities from were things used to be. 09/20/18 on evaluation today patient actually appears to be doing much better. The erythematous lower extremity is improving wound itself which is still open appears to be doing much better as far as but appearance as well as pain is concerned overall very pleased in this regard. There's no signs of active infection at this time. 09/27/18 on evaluation today patient's wounds on the lower extremity actually appear to be doing fairly well at this time which is good news. There is no evidence of active infection currently and again is just as left lower extremity were there any wounds at this point anyway. I believe they may be completely healed but again I'm not 100% sure based on evaluation today. I think one more week of observation would likely be a good idea. 10/04/18 on evaluation today patient actually appears to be doing excellent in regard to the left lower extremity which actually appears to be completely healed as of today. Unfortunately he's been having issues with his right lower extremity have a new wound that has opened. Fortunately there's no evidence of active infection at this time which is  good news. 10/10/18 upon evaluation today patient actually appears to be doing a little bit worse with the new open area on his right posterior lower extremity. He's been tolerating the dressing changes without complication. Right now we been using his compression wraps although I think we may need to switch back to actually performing bilateral compression wraps are in the clinic. No fevers, chills, nausea, or vomiting noted at this time. 10/17/18 on evaluation today patient actually appears to be doing quite well in regard to his bilateral lower extremity ulcers. He has been tolerating the reps without complication although he would prefer not to rewrap his legs as of today. Fortunately there's no signs of active infection which is good news. No fevers, chills, nausea, or vomiting noted at this time. 10/24/18 on evaluation today patient appears to be doing very well in regard to his lower extremities. His right lower extremity is shown signs of healing and his left lower [place] though not healed appears to be improving which is excellent news. Overall very pleased with how things seem to be progressing at this time. The patient is likewise happy to hear this. His Velcro compression wraps however have not been put on properly will gonna show his wife how to do that properly today. 10/31/18 on evaluation today patient appears to be doing more poorly in regard to his left lower extremity in particular although both lower extremities actually are showing some signs of being worse than my previous evaluation. Unfortunately I'm just not sure that his compression stockings even with the use of the compression/lymphedema pumps seem to be controlling this well. Upon further questioning he tells me that he also is not able to lie flat in the bed  due to his congestive heart failure and difficulty breathing. For that reason he sleeps in his recliner. To make matters worse his recliner also cannot even hold  his legs up so instead of even being somewhat elevated they pretty much hang down to the floor. This is the way he sleeps each night which is definitely counterproductive to everything else that were attempting to do from the standpoint of controlling his fluid. Nonetheless I think that he potentially could benefit from a hospital bed although this would be something that his primary care provider would likely have to order since anything that is order on our side has to be directly related to wound care and again the hospital bed is not necessarily a direct relation to although I think it does contribute to his overall wound status on his lower extremities. 11/07/18 on evaluation today patient appears to be doing better in regard to his bilateral lower extremity. He's been tolerating the dressing changes without complication. We did in the interim since I last saw him switch to just using extras orbiting new alginate and that seems to have done much better for him. I'm very pleased with the overall progress that is made. 11/14/18 on evaluation today patient appears to be redoing rather well in regard to his left lower extremity ulcers which are the only ones remaining at this point. Fortunately there's no signs of active infection at this time which is good news. Overall been very pleased with how things seem to be progressing currently. No fevers, chills, nausea, or vomiting noted at this time. 11/20/17 on evaluation today patient actually appears to be doing quite well in regard to his lower extremities on the left on the right he has several blisters that showed up although there's some question about whether or not he has had his broker compression wraps on like he was supposed to or not. Fortunately there's no signs of active infection at this time which is good news. Unfortunately though he is doing well from the standpoint of the left leg the right leg is not doing as well again this Adam Merritt, Adam Merritt. ([id number]) is when we did not wrap last week. 11/28/18 on evaluation today patient appears to be doing well in regard to his bilateral lower extremities is left appears to be healed is right is not healed but is very close to being so. Overall very pleased with how things seem to be progressing. Patient is likewise happy that things are doing well. 12/09/18 on evaluation today patient actually appears to be completely healed which is excellent news. He actually seems to be doing well in regard to the swelling of the bilateral lower extremities which is also great news. Overall very pleased with how things seem to be progressing. Electronic Signature(s) Signed: 12/11/2018 9:09:50 PM By: Worthy Keeler PA-C Entered By: Worthy Keeler on 12/11/2018 20:45:21 Adam Merritt ([id number]) -------------------------------------------------------------------------------- Physical Exam Details Patient Name: Adam Merritt Date of Service: 12/09/2018 1:45 PM Medical Record Number: [id number] Patient Account Number: [id number] Date of Birth/Sex: September 15, 1945 (73 y.o. M) Treating RN: Harold Barban Primary Care Provider: [name] Other Clinician: Referring Provider: [name] Treating Provider/Extender: STONE III, [name]: 55 Constitutional Obese and well-hydrated in no acute distress. Respiratory normal breathing without difficulty. clear to auscultation bilaterally. Cardiovascular regular rate and rhythm with normal S1, S2. Psychiatric this patient is able to make decisions and demonstrates good insight into disease process. Alert and Oriented x 3. pleasant and  cooperative. Notes Patient's wounds currently is to show signs of complete epithelialization which is excellent news. With that being said I do not see any evidence of active infection at this time which is also good news. Electronic Signature(s) Signed: 12/11/2018 9:09:50 PM By: Worthy Keeler PA-C Entered By:  Worthy Keeler on 12/11/2018 20:48:38 Adam Merritt ([id number]) -------------------------------------------------------------------------------- Physician Orders Details Patient Name: Adam Merritt Date of Service: 12/09/2018 1:45 PM Medical Record Number: [id number] Patient Account Number: [id number] Date of Birth/Sex: 05-28-1946 (73 y.o. 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 Electronic Signature(s) Signed: 12/09/2018 4:36:57 PM By: Harold Barban Signed: 12/11/2018 9:09:50 PM By: Worthy Keeler PA-C Entered By: Harold Barban on 12/09/2018 14:40:14 Adam Merritt ([id number]) -------------------------------------------------------------------------------- Problem List Details Patient Name: Adam Merritt Date of Service: 12/09/2018 1:45 PM Medical Record Number: [id number] Patient Account Number: [id number] Date of Birth/Sex: 12-May-1946 (73 y.o. M) Treating RN: Harold Barban Primary Care Provider: [name] Other Clinician: Referring Provider: [name] Treating Provider/Extender: Melburn Hake, [name]: 66 Active Problems ICD-10 Evaluated Encounter Code Description Active Date Today Diagnosis I89.0 Lymphedema, not  elsewhere classified 04/09/2018 No Yes L97.512 Non-pressure chronic ulcer of other part of right foot with fat 05/28/2018 No Yes layer exposed L97.822 Non-pressure chronic ulcer of other part of left lower leg with 05/28/2018 No Yes fat layer exposed L98.492 Non-pressure chronic ulcer of skin of other sites with fat layer 06/11/2018 No Yes exposed E66.9 Obesity, unspecified 04/09/2018 No Yes Inactive Problems Resolved Problems ICD-10 Code Description Active Date Resolved Date L03.115 Cellulitis of right lower limb 04/09/2018 04/09/2018 L03.116 Cellulitis of left lower limb 04/09/2018 04/09/2018 Electronic Signature(s) Signed: 12/11/2018 9:09:50 PM By: Worthy Keeler PA-C Entered By: Worthy Keeler on 12/09/2018 14:32:38 Adam Merritt ([id number]) CHANLER, MENDONCA ([id number]) -------------------------------------------------------------------------------- Progress Note Details Patient Name: Adam Merritt Date of Service: 12/09/2018 1:45 PM Medical Record Number: [id number] Patient Account Number: [id number] Date of Birth/Sex: July 04, 1945 (73 y.o. M) Treating RN: Harold Barban Primary Care Provider: [name] Other Clinician: Referring Provider: [name] Treating Provider/Extender: Melburn Hake, [name]: 73 Subjective Chief Complaint Information obtained from Patient he is here for evaluation of bilateral lower extremity lymphedema right right toe and left LE ulcers History of Present Illness (HPI) 10/18/17-He is here for initial evaluation of bilateral lower extremity ulcerations in the presence of venous insufficiency and lymphedema. He has been seen by vascular medicine in the past, Dr. Lucky Cowboy, last seen in 2016. He does have a history of abnormal ABIs, which is to be expected given his lymphedema and venous insufficiency. According to Epic, it appears that all attempts for arterial evaluation and/or angiography were not follow through with by patient. He does have a  history of being seen in lymphedema clinic in 2018, stopped going approximately 6 months  ago stating "it didn't do any good". He does not have lymphedema pumps, he does not have custom fit compression wrap/stockings. He is diabetic and his recent A1c last month was 7.6. He admits to chronic bilateral lower extremity pain, no change in pain since blister and ulceration development. He is currently being treated with Levaquin for bronchitis. He has home health and we will continue. 10/25/17-He is here in follow-up evaluation for bilateral lower extremity ulcerationssubtle he remains on Levaquin for bronchitis. Right lower extremity with no evidence of drainage or ulceration, persistent left lower extremity ulceration. He states that home health has not been out since his appointment. He went to [facility] on Tuesday, studies revealed: RIGHT ABI 0.9, TBI 0.6 LEFT ABI 1.1, TBI 0.6 with triphasic flow bilaterally. We will continue his same treatment plan. He has been educated on compression therapy and need for elevation. He will benefit from lymphedema pumps 11/01/17-He is here in follow-up evaluation for left lower extremity ulcer. The right lower extremity remains healed. He has home health services, but they have not been out to see the patient for 2-3 weeks. He states it home health physical therapy changed his dressing yesterday after therapy; he placed Ace wrap compression. We are still waiting for lymphedema pumps, reordered d/t need for company change. 11/08/17-He is here in follow-up evaluation for left lower extremity ulcer. It is improved. Edema is significantly improved with compression therapy. We will continue with same treatment plan and he will follow-up next week. No word regarding lymphedema pumps 11/15/17-He is here in follow-up evaluation for left lower extremity ulcer. He is healed and will be discharged from wound care services. I have reached out to [facility]  regarding his lymphedema pumps. They have been unable to reach the patient; the contact number they had with the patient's wife's cell phone and she has not answered any unrecognized calls. Contact should be made today, trial planned for next week; [facility] will continue to follow 11/27/17 on evaluation today patient has multiple blistered areas over the right lower extremity his left lower extremity appears to be doing okay. These blistered areas show signs of no infection which is great news. With that being said he did have some necrotic skin overlying which was mechanically debrided away with saline and gauze today without complication. Overall post debridement the wounds appear to be doing better but in general his swelling seems to be increased. This is obviously not good news. I think this is what has given rise to the blisters. 12/04/17 on evaluation today patient presents for follow-up concerning his bilateral lower extremity edema in the right lower extremity ulcers. He has been tolerating the dressing changes without complication. With that being said he has had no real issues with the wraps which is also good news. Overall I'm pleased with the progress he's been making. 12/11/17 on evaluation today patient appears to be doing rather well in regard to his right lateral lower extremity ulcer. He's been tolerating the dressing changes without complication. Fortunately there does not appear to be any evidence of infection at this time. Overall I'm pleased with the progress that is being made. Unfortunately he has been in the hospital due to having what sounds to be a stomach virus/flu fortunately that is starting to get better. 12/18/17 on evaluation today patient actually appears to be doing very well in regard to his bilateral lower extremities the HAMMAD, FINKLER. ([id number]) swelling is under fairly good control his lymphedema pumps  are still not up and running quite yet. With that  being said he does have several areas of opening noted as far as wounds are concerned mainly over the left lower extremity. With that being said I do believe once he gets lymphedema pumps this would least help you mention some the fluid and preventing this from occurring. Hopefully that will be set up soon sleeves are Artie in place at his home he just waiting for the machine. 12/25/17 on evaluation today patient actually appears to be doing excellent in fact all of his ulcers appear to have resolved his legs appear very well. I do think he needs compression stockings we have discussed this and they are actually going to go to [facility] today to elastic therapy to get this fitted for him. I think that is definitely a good thing to do. Readmission: 04/09/18 upon evaluation today this patient is seen for readmission due to bilateral lower extremity lymphedema. He has significant swelling of his extremities especially on the left although the right is also swollen he has weeping from both sides. There are no obvious open wounds at this point. Fortunately he has been doing fairly well for quite a bit of time since I last saw him. Nonetheless unfortunately this seems to have reopened and is giving quite a bit of trouble. He states this began about a week ago when he first called [place] to get in to be seen. No fevers, chills, nausea, or vomiting noted at this time. He has not been using his lymphedema pumps due to the fact that they won't fit on his leg at this point likewise is also not been using his compression for essentially the same reason. 04/16/18 upon evaluation today patient actually appears to be doing a little better in regard to the fluid in his bilateral lower extremities. With that being said he's had three falls since I saw him last week. He also states that he's been feeling very poorly. I was concerned last week and feel like that the concern is still there as far as the congestion in his  chest is concerned he seems to be breathing about the same as last week but again he states he's very weak he's not even able to walk further than from the chair to the door. His wife had to buy a wheelchair just to be able to get them out of the house to get to the appointment today. This has me very concerned. 04/23/18 on evaluation today patient actually appears to be doing much better than last week's evaluation. At that time actually had to transport him to the ER via EMS and he subsequently was admitted for acute pulmonary edema, acute renal failure, and acute congestive heart failure. Fortunately he is doing much better. Apparently they did dialyze him and were able to take off roughly 35 pounds of fluid. Nonetheless he is feeling much better both in regard to his breathing and he's able to get around much better at this time compared to previous. Overall I'm very happy with how things are at this time. There does not appear to be any evidence of infection currently. No fevers, chills, nausea, or vomiting noted at this time. 04/30/2018 patient seen today for follow-up and management of bilateral lower extremity lymphedema. He did express being more sad today than usual due to the recent loss of his dog. He states that he has been compliant with using the lymphedema pumps. However he does admit a minute over the  last 2-3 days he has not been using the pumps due to the recent loss of his dog. At this time there is no drainage or open wounds to his lower extremities. The left leg edema is measuring smaller today. Still has a significant amount of edema on bilateral lower extremities With dry flaky skin. He will be referred to the lymphedema clinic for further management. Will continue 3 layer compression wraps and follow-up in 1-2 weeks.Denies any pain, fever, chairs recently. No recent falls or injuries reported during this visit. 05/07/18 on evaluation today patient actually appears to be doing  very well in regard to his lower extremities in general all things considered. With that being said he is having some pain in the legs just due to the amount of swelling. He does have an area where he had a blister on the left lateral lower extremity this is open at this point other than that there's nothing else weeping at this time. 05/14/18 on evaluation today patient actually appears to be doing excellent all things considered in regard to his lower extremities. He still has a couple areas of weeping on each leg which has continued to be the issue for him. He does have an appointment with the lymphedema clinic although this isn't until February 2020. That was the earliest they had. In the meantime he has continued to tolerate the compression wraps without complication. 05/28/18 on evaluation today patient actually appears to be doing more poorly in regard to his left lower extremity where he has a wound open at this point. He also had a fall where he subsequently injured his right great toe which has led to an open wound at the site unfortunately. He has been tolerating the dressing changes without complication in general as far as the wraps are concerned that he has not been putting any dressing on the left 1st toe ulcer site. 06/11/18 on evaluation today patient appears to be doing much worse in regard to his bilateral lower extremity ulcers. He has been tolerating the dressing changes without complication although his legs have not been wrapped more recently. Overall I am not very pleased with the way his legs appear. I do believe he needs to be back in compression wraps he still has not received his compression wraps from the [facility] hospital as of yet. Adam Merritt, Adam Merritt ([id number]) 06/18/18 on evaluation today patient actually appears to be doing significantly better than last time I saw him. He has been tolerating the compression wraps without complication in the circumferential ulcers especially  appear to be doing much better. His toe ulcer on the right in regard to the great toe is better although not as good as the legs in my pinion. No fevers chills noted 07/02/18 on evaluation today patient appears to be doing much better in regard to his lower extremity ulcers. Unfortunately since I last saw him he's had the distal portion of his right great toe if he dated it sounds as if this actually went downhill very quickly. I had only seen him a few days prior and the toe did not appear to be infected at that point subsequently became infected very rapidly and it was decided by the surgeon that the distal portion of the toe needed to be removed. The patient seems to be doing well in this regard he tells me. With that being said his lower extremities are doing better from the standpoint of the wounds although he is significantly swollen at this point. 07/09/18  on evaluation today patient appears to be doing better in regard to the wounds on his lower extremities. In fact everything is almost completely healed he is just a small area on the left posterior lower extremity that is open at this point. 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 With that being said he needs some kind of compression it does sound like he may not be wearing his compression, that is the wraps, during the entire time between when he's here visit to visit. Apparently his wife took the current one off because it began to "fall apart". 07/16/18 on evaluation today patient appears to be extremely swollen especially in regard to his left lower extremity unfortunately. He also has a new skin tear over the left lower extremity and there's a smaller area on the right lower extremity as well. Unfortunately this seems to be due in part to blistering and  fluid buildup in his leg. He did get the reduction wraps that were ordered by the [facility] for him to go to lymphedema clinic. With that being said his wounds on the legs have not healed to the point to where they would likely accept them as a patient lymphedema clinic currently. We need to try to get this to heal. With that being said he's been taking his wraps off which is not doing him any favors at this point. In fact this is probably quite counterproductive compared to what needs to occur. We will likely need to increase to a four layer compression wrap and continue to also utilize elevation and he has to keep the wraps on not take them off as he's been doing currently hasn't had a wrap on since Saturday. 07/23/18 on evaluation today patient appears to be doing much better in regard to his bilateral lower extremities. In fact his left lower extremity which was the largest is actually 15 cm smaller today compared to what it was last time he was here in our clinic. This is obviously good news after just one week. Nonetheless the differences he actually kept the wraps on during the entire week this time. That's not typical for him. I do believe he understands a little bit better now the severity of the situation and why it's important for him to keep these wraps on. 07/30/18 on evaluation today patient actually appears to be doing rather well in regard to his lower extremities. His legs are much smaller than they have been in the past and he actually has only one very small rudder superficial region remaining that is not closed on the left lateral/posterior lower extremity even this is almost completely close. I do believe likely next week he will be healed without any complications. I do think we need to continue the wraps however this seems to be beneficial for him. I also think it may be a good time for [place] to go ahead and see about getting the appointment with the lymphedema clinic which is  supposed to be made for him in order to keep this moving along and hopefully get them into compression wraps that will in the end help him to remain healed. 08/06/18 on evaluation today patient actually appears to be doing very well in regard as bilateral lower extremities. In fact his wounds appear to be completely healed at this time. He  does have bilateral lymphedema which has been extremely well controlled with the compression wraps. He is in the process of getting appointment with the lymphedema clinic we have made this referral were just waiting to hear back on the schedule time. We need to follow up on that today as well. 08/13/18 on evaluation today patient actually appears to be doing very well in regard to his bilateral lower extremities there are no open wounds at this point. We are gonna go ahead and see about ordering the Velcro compression wraps for him had a discussion with them about [place] doing it versus the [place] they feel like they can definitely afford going ahead and get the wraps themselves and they would prefer to try to avoid having to go to the lymphedema clinic if it all possible which I completely understand. As long as he has good compression I'm okay either way. 3/17//20 on evaluation today patient actually appears to be doing well in regard to his bilateral lower extremity ulcers. He has been tolerating the dressing changes without complication specifically the compression wraps. Overall is had no issues my fingers finding that I see at this point is that he is having trouble with constipation. He tells me he has not been able to go to the bathroom for about six days. He's taken to over-the-counter oral laxatives unfortunately this is not helping. He has not contacted his doctor. Adam Merritt, Adam Merritt ([id number]) 08/27/18 on evaluation today patient appears to be doing fairly well in regard to his lower extremities at this point. There does not appear to be any new altars is swelling  is very well controlled. We are still waiting for his Velcro compression wraps to arrive that should be sometime in the next week is Artie sent the check for these. 09/03/18 on evaluation today patient appears to be doing excellent in regard to his bilateral lower extremities which he shows no signs of wound openings in regard to this point. He does have his Velcro compression wraps which did arrive in the mail since I saw him last week. Overall he is doing excellent in my pinion. 09/13/18 on evaluation today patient appears to be doing very well currently in regard to the overall appearance of his bilateral lower extremities although he's a little bit more swollen than last time we saw him. At that point he been discharged without any open wounds. Nonetheless he has a small open wound on the posterior left lower extremity with some evidence of cellulitis noted as well. Fortunately I feel like he has made good progress overall with regard to his lower extremities from were things used to be. 09/20/18 on evaluation today patient actually appears to be doing much better. The erythematous lower extremity is improving wound itself which is still open appears to be doing much better as far as but appearance as well as pain is concerned overall very pleased in this regard. There's no signs of active infection at this time. 09/27/18 on evaluation today patient's wounds on the lower extremity actually appear to be doing fairly well at this time which is good news. There is no evidence of active infection currently and again is just as left lower extremity were there any wounds at this point anyway. I believe they may be completely healed but again I'm not 100% sure based on evaluation today. I think one more week of observation would likely be a good idea. 10/04/18 on evaluation today patient actually appears to be doing excellent in regard  to the left lower extremity which actually appears to be completely  healed as of today. Unfortunately he's been having issues with his right lower extremity have a new wound that has opened. Fortunately there's no evidence of active infection at this time which is good news. 10/10/18 upon evaluation today patient actually appears to be doing a little bit worse with the new open area on his right posterior lower extremity. He's been tolerating the dressing changes without complication. Right now we been using his compression wraps although I think we may need to switch back to actually performing bilateral compression wraps are in the clinic. No fevers, chills, nausea, or vomiting noted at this time. 10/17/18 on evaluation today patient actually appears to be doing quite well in regard to his bilateral lower extremity ulcers. He has been tolerating the reps without complication although he would prefer not to rewrap his legs as of today. Fortunately there's no signs of active infection which is good news. No fevers, chills, nausea, or vomiting noted at this time. 10/24/18 on evaluation today patient appears to be doing very well in regard to his lower extremities. His right lower extremity is shown signs of healing and his left lower [place] though not healed appears to be improving which is excellent news. Overall very pleased with how things seem to be progressing at this time. The patient is likewise happy to hear this. His Velcro compression wraps however have not been put on properly will gonna show his wife how to do that properly today. 10/31/18 on evaluation today patient appears to be doing more poorly in regard to his left lower extremity in particular although both lower extremities actually are showing some signs of being worse than my previous evaluation. Unfortunately I'm just not sure that his compression stockings even with the use of the compression/lymphedema pumps seem to be controlling this well. Upon further questioning he tells me that he also is not  able to lie flat in the bed due to his congestive heart failure and difficulty breathing. For that reason he sleeps in his recliner. To make matters worse his recliner also cannot even hold his legs up so instead of even being somewhat elevated they pretty much hang down to the floor. This is the way he sleeps each night which is definitely counterproductive to everything else that were attempting to do from the standpoint of controlling his fluid. Nonetheless I think that he potentially could benefit from a hospital bed although this would be something that his primary care provider would likely have to order since anything that is order on our side has to be directly related to wound care and again the hospital bed is not necessarily a direct relation to although I think it does contribute to his overall wound status on his lower extremities. 11/07/18 on evaluation today patient appears to be doing better in regard to his bilateral lower extremity. He's been tolerating the dressing changes without complication. We did in the interim since I last saw him switch to just using extras orbiting new alginate and that seems to have done much better for him. I'm very pleased with the overall progress that is made. 11/14/18 on evaluation today patient appears to be redoing rather well in regard to his left lower extremity ulcers which are the only ones remaining at this point. Fortunately there's no signs of active infection at this time which is good news. Overall been Adam Merritt, Adam Merritt ([id number]) very pleased with how things seem  to be progressing currently. No fevers, chills, nausea, or vomiting noted at this time. 11/20/17 on evaluation today patient actually appears to be doing quite well in regard to his lower extremities on the left on the right he has several blisters that showed up although there's some question about whether or not he has had his broker compression wraps on like he was supposed to or  not. Fortunately there's no signs of active infection at this time which is good news. Unfortunately though he is doing well from the standpoint of the left leg the right leg is not doing as well again this is when we did not wrap last week. 11/28/18 on evaluation today patient appears to be doing well in regard to his bilateral lower extremities is left appears to be healed is right is not healed but is very close to being so. Overall very pleased with how things seem to be progressing. Patient is likewise happy that things are doing well. 12/09/18 on evaluation today patient actually appears to be completely healed which is excellent news. He actually seems to be doing well in regard to the swelling of the bilateral lower extremities which is also great news. Overall very pleased with how things seem to be progressing. Patient History Information obtained from Patient. Family History Cancer - Paternal Grandparents, Kidney Disease - Siblings, Lung Disease - Mother, No family history of Diabetes, Heart Disease, Hereditary Spherocytosis, Hypertension, Seizures, Stroke, Thyroid Problems, Tuberculosis. Social History Never smoker, Marital Status - Married, Alcohol Use - Never, Drug Use - No History, Caffeine Use - Never. Medical History Eyes Denies history of Cataracts, Glaucoma, Optic Neuritis Ear/Nose/Mouth/Throat Denies history of Chronic sinus problems/congestion, Middle ear problems Hematologic/Lymphatic Patient has history of Anemia - aplastic anemia, Lymphedema Denies history of Hemophilia, Human Immunodeficiency Virus, Sickle Cell Disease Respiratory Patient has history of Chronic Obstructive Pulmonary Disease (COPD), Sleep Apnea Denies history of Aspiration, Asthma, Pneumothorax, Tuberculosis Cardiovascular Patient has history of Congestive Heart Failure, Coronary Artery Disease, Hypertension, Peripheral Venous Disease Denies history of Angina, Arrhythmia, Deep Vein Thrombosis,  Hypotension, Myocardial Infarction, Peripheral Arterial Disease, Phlebitis, Vasculitis Gastrointestinal Denies history of Cirrhosis , Colitis, Crohn s, Hepatitis A, Hepatitis B, Hepatitis C Endocrine Patient has history of Type II Diabetes Denies history of Type I Diabetes Genitourinary Denies history of End Stage Renal Disease Immunological Denies history of Lupus Erythematosus, Raynaud s, Scleroderma Integumentary (Skin) Denies history of History of Burn, History of pressure wounds Musculoskeletal Patient has history of Osteoarthritis Denies history of Gout, Rheumatoid Arthritis, Osteomyelitis Neurologic Patient has history of Neuropathy Denies history of Dementia, Quadriplegia, Paraplegia, Seizure Disorder Adam Merritt, Adam Merritt ([id number]) Oncologic Patient has history of Received Chemotherapy - last dose 2006 Denies history of Received Radiation Psychiatric Denies history of Anorexia/bulimia, Confinement Anxiety Hospitalization/Surgery History - bronchitis. Medical And Surgical History Notes Respiratory home O2 at night as needed Cardiovascular varicose veins Neurologic CVA - 1992 Review of Systems (ROS) Constitutional Symptoms (General Health) Denies complaints or symptoms of Fatigue, Fever, Chills, Marked Weight Change. Respiratory Denies complaints or symptoms of Chronic or frequent coughs, Shortness of Breath. Cardiovascular Denies complaints or symptoms of Chest pain, LE edema. Psychiatric Denies complaints or symptoms of Anxiety, Claustrophobia. Objective Constitutional Obese and well-hydrated in no acute distress. Vitals Time Taken: 2:05 PM, Height: 66 in, Weight: 323 lbs, BMI: 52.1, Temperature: 98.7 F, Pulse: 78 bpm, Respiratory Rate: 20 breaths/min, Blood Pressure: 137/62 mmHg. Respiratory normal breathing without difficulty. clear to auscultation bilaterally. Cardiovascular regular rate and rhythm with normal S1,  S2. Psychiatric this patient is able to  make decisions and demonstrates good insight into disease process. Alert and Oriented x 3. pleasant and cooperative. General Notes: Patient's wounds currently is to show signs of complete epithelialization which is excellent news. With that being said I do not see any evidence of active infection at this time which is also good news. Integumentary (Hair, Skin) Wound #23 status is Healed - Epithelialized. Original cause of wound was Gradually Appeared. The wound is located on the [place] ([id number]) Right,Lateral Lower Leg. The wound measures 0cm length x 0cm width x 0cm depth; 0cm^2 area and 0cm^3 volume. 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 My suggestion currently is gonna be that we discontinue wound care services we will go ahead and have the patient wear his own compression stockings at this point hopefully that will do well to keep things in control for him. He's in agreement with plan. If anything changes or worsens will let me know otherwise hopefully as long as he keeps the compression stockings in place and wears them during the day when is up and about obscene take these off at home he should be just fine. Electronic Signature(s) Signed: 12/11/2018 9:09:50 PM By: Worthy Keeler PA-C Entered By: Worthy Keeler on 12/11/2018 20:48:57 Adam Merritt ([id number]) -------------------------------------------------------------------------------- ROS/PFSH Details Patient Name: Adam Merritt Date of Service: 12/09/2018 1:45 PM Medical Record Number: [id number] Patient Account Number: [id number] Date of Birth/Sex: July 18, 1945 (73 y.o. M) Treating RN: Harold Barban Primary Care Provider: [name] Other Clinician: Referring Provider: [name] Treating Provider/Extender: Melburn Hake, [name]: 25 Information Obtained From Patient Constitutional Symptoms (General Health) Complaints and Symptoms: Negative for: Fatigue; Fever; Chills; Marked Weight Change Respiratory Complaints and Symptoms: Negative for: Chronic or frequent coughs; Shortness of Breath Medical History: Positive for: Chronic Obstructive Pulmonary Disease (COPD); Sleep Apnea Negative for: Aspiration; Asthma; Pneumothorax; Tuberculosis Past Medical History Notes: home O2 at night as needed Cardiovascular Complaints and Symptoms: Negative for: Chest pain; LE edema Medical History: Positive for: Congestive Heart Failure; Coronary Artery Disease; Hypertension; Peripheral Venous Disease Negative for: Angina; Arrhythmia; Deep Vein Thrombosis; Hypotension; Myocardial Infarction; Peripheral Arterial Disease; Phlebitis; Vasculitis Past Medical History Notes: varicose veins Psychiatric Complaints and Symptoms: Negative for: Anxiety; Claustrophobia Medical History: Negative for: Anorexia/bulimia; Confinement Anxiety Eyes Medical History: Negative for: Cataracts; Glaucoma; Optic Neuritis Ear/Nose/Mouth/Throat Medical History: Negative for: Chronic sinus problems/congestion; Middle ear problems Hematologic/Lymphatic Adam Merritt, Adam Merritt ([id number]) Medical History: Positive for: Anemia - aplastic anemia; Lymphedema Negative for: Hemophilia; Human Immunodeficiency Virus; Sickle Cell Disease Gastrointestinal Medical History: Negative for: Cirrhosis ; Colitis; Crohnos; Hepatitis A; Hepatitis B; Hepatitis C Endocrine Medical History: Positive for: Type II Diabetes Negative for: Type I  Diabetes Time with diabetes: since 2006 Treated with: Insulin Blood sugar tested every day: Yes Tested : Blood sugar testing results: Breakfast: 209 Genitourinary Medical History: Negative for: End Stage Renal Disease Immunological Medical History: Negative for: Lupus Erythematosus; Raynaudos; Scleroderma Integumentary (Skin) Medical History: Negative for: History of Burn; History of pressure wounds  Musculoskeletal Medical History: Positive for: Osteoarthritis Negative for: Gout; Rheumatoid Arthritis; Osteomyelitis Neurologic Medical History: Positive for: Neuropathy Negative for: Dementia; Quadriplegia; Paraplegia; Seizure Disorder Past Medical History Notes: CVA - 1992 Oncologic Medical History: Positive for: Received Chemotherapy - last dose 2006 Negative for: Received Radiation Immunizations Pneumococcal Vaccine: Received Pneumococcal Vaccination: Yes Adam Merritt, Adam Merritt ([id number]) Implantable Devices No devices added Hospitalization / Surgery History Type of Hospitalization/Surgery bronchitis Family and Social History Cancer: Yes - Paternal Grandparents; Diabetes: No; Heart Disease: No; Hereditary Spherocytosis: No; Hypertension: No; Kidney Disease: Yes - Siblings; Lung Disease: Yes - Mother; Seizures: No; Stroke: No; Thyroid Problems: No; Tuberculosis: No; Never smoker; Marital Status - Married; Alcohol Use: Never; Drug Use: No History; Caffeine Use: Never; Financial Concerns: No; Food, Clothing or Shelter Needs: No; Support System Lacking: No; Transportation Concerns: No Physician Affirmation I have reviewed and agree with the above information. Electronic Signature(s) Signed: 12/11/2018 9:09:50 PM By: Worthy Keeler PA-C Signed: 12/13/2018 2:24:01 PM By: Harold Barban Entered By: Worthy Keeler on 12/11/2018 20:48:23 Adam Merritt, PEZZULLO ([id number]) -------------------------------------------------------------------------------- SuperBill Details Patient Name:  Adam Merritt Date of Service: 12/09/2018 Medical Record Number: [id number] Patient Account Number: [id number] Date of Birth/Sex: 10/18/1945 (73 y.o. 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 E66.9 Obesity, unspecified Facility Procedures CPT4 Code: 62703500 Description: 93818 - WOUND CARE VISIT-LEV 2 EST PT Modifier: Quantity: 1 Physician Procedures CPT4 Code Description: [id number] 99214 - WC PHYS LEVEL 4 - EST PT ICD-10 Diagnosis Description I89.0 Lymphedema, not elsewhere classified L97.512 Non-pressure chronic ulcer of other part of right foot with fat L97.822 Non-pressure chronic ulcer of other  part of left lower leg with L98.492 Non-pressure chronic ulcer of skin of other sites with fat layer Modifier: layer exposed fat layer expos exposed Quantity: 1 ed Electronic Signature(s) Signed: 12/11/2018 9:09:50 PM By: Worthy Keeler PA-C Entered By: Worthy Keeler on 12/09/2018 23:07:00

## 2018-12-17 ENCOUNTER — Other Ambulatory Visit: Payer: Self-pay

## 2018-12-17 ENCOUNTER — Ambulatory Visit (INDEPENDENT_AMBULATORY_CARE_PROVIDER_SITE_OTHER): Payer: Medicare Other | Admitting: Nurse Practitioner

## 2018-12-17 ENCOUNTER — Encounter: Payer: Self-pay | Admitting: Nurse Practitioner

## 2018-12-17 VITALS — BP 134/84 | HR 72 | Resp 16 | Ht 66.0 in | Wt 300.6 lb

## 2018-12-17 DIAGNOSIS — G894 Chronic pain syndrome: Secondary | ICD-10-CM | POA: Diagnosis not present

## 2018-12-17 DIAGNOSIS — I10 Essential (primary) hypertension: Secondary | ICD-10-CM

## 2018-12-17 DIAGNOSIS — I890 Lymphedema, not elsewhere classified: Secondary | ICD-10-CM | POA: Diagnosis not present

## 2018-12-17 DIAGNOSIS — E1165 Type 2 diabetes mellitus with hyperglycemia: Secondary | ICD-10-CM

## 2018-12-17 LAB — POCT GLYCOSYLATED HEMOGLOBIN (HGB A1C): Hemoglobin A1C: 6.2 % — AB (ref 4.0–5.6)

## 2018-12-17 MED ORDER — FENTANYL 100 MCG/HR TD PT72: 1.0000 | MEDICATED_PATCH | TRANSDERMAL | 0 refills | Status: DC

## 2018-12-17 NOTE — Progress Notes (Signed)
[facility] [place], [place] [address]  Internal MEDICINE  Office Visit Note  Patient Name: CHRISTINA WALDROP  [id number]  [id number]  Date of Service: 12/17/2018  Chief Complaint  Patient presents with  . Medical Management of Chronic Issues    4wk follow up  . Hypertension  . Diabetes    A1C    The patient is c/o increased pain in both legs.Fentanyl patch dose increased to 126mcg at his last visit, changed every 3 days. This dose of medication has helped control a great deal. Best he has felt in a long time .,   He continues to see wound care for chronic and recurring wounds to bilateral lowe legs. They are currently wrapped in clean dry dressings and pressure bandages, applied per wound clinic. His blood pressure is well controlled as are blood sugars. His HgbA1c is  6.2 today. He has been approved to get a hospital bed. Should be delivered to his home next Monday or Tuesday.       Current Medication: Outpatient Encounter Medications as of 12/17/2018  Medication Sig  . albuterol (PROVENTIL HFA;VENTOLIN HFA) 108 (90 Base) MCG/ACT inhaler INHALE 1 PUFF INTO THE LUNGS EVERY 6 HOURS AS NEEDED FOR WHEEZING OR SHORTNESS OF BREATH (Patient taking differently: Inhale 1 puff into the lungs every 6 (six) hours as needed for wheezing or shortness of breath. )  . aspirin 325 MG EC tablet Take 325 mg by mouth daily.  Marland Kitchen atorvastatin (LIPITOR) 40 MG tablet Take 40 mg by mouth at bedtime.   . carvedilol (COREG) 25 MG tablet Take 25 mg by mouth 2 (two) times daily with a meal.  . docusate sodium (COLACE) 50 MG capsule Take 50 mg by mouth 2 (two) times daily.  Marland Kitchen doxycycline (VIBRAMYCIN) 100 MG capsule TK ONE C PO BID FOR 10 DAYS. DO NOT TK MULTIVITAMIN WITH THIS MEDICATION  . febuxostat (ULORIC) 40 MG tablet Take 1 tablet (40 mg total) by mouth daily.  . fentaNYL (DURAGESIC) 100 MCG/HR Place 1 patch onto the skin every 3 (three) days.  . furosemide (LASIX) 40 MG tablet Take 1  tablet (40 mg total) by mouth daily.  Marland Kitchen gabapentin (NEURONTIN) 300 MG capsule Take 2 cap in the morning, 1 cap in the evening and 1 cap at night.  . hydrALAZINE (APRESOLINE) 25 MG tablet   . HYDROcodone-acetaminophen (NORCO/VICODIN) 5-325 MG tablet Take 1 tablet by mouth every 6 (six) hours as needed for moderate pain or severe pain.  . indapamide (LOZOL) 2.5 MG tablet Take 1 tablet (2.5 mg total) by mouth daily.  . Insulin Glargine (BASAGLAR KWIKPEN) 100 UNIT/ML SOPN INJECT 30 TO 36 UNITS UNDER THE SKIN EVERY DAY  . ipratropium-albuterol (DUONEB) 0.5-2.5 (3) MG/3ML SOLN Take 3 mLs by nebulization every 4 (four) hours as needed (for shortness of breath).  . metolazone (ZAROXOLYN) 2.5 MG tablet   . Multiple Vitamin (MULTIVITAMIN WITH MINERALS) TABS tablet Take 1 tablet by mouth daily.  . pentoxifylline (TRENTAL) 400 MG CR tablet TAKE 1 TABLET BY MOUTH THREE TIMES DAILY FOR CLAUDICATION  . potassium chloride SA (K-DUR) 20 MEQ tablet Take 1 tablet (20 mEq total) by mouth 3 (three) times daily.  . prazosin (MINIPRESS) 2 MG capsule   . sennosides-docusate sodium (SENOKOT-S) 8.6-50 MG tablet Take 1-2 tablets by mouth 2 (two) times daily.  . sertraline (ZOLOFT) 100 MG tablet   . traZODone (DESYREL) 50 MG tablet Take 50 mg by mouth at bedtime.  Marland Kitchen VICTOZA  18 MG/3ML SOPN INJECT 1.8MG  INTO THE SKIN DAILY  . [DISCONTINUED] fentaNYL (DURAGESIC) 100 MCG/HR Place 1 patch onto the skin every 3 (three) days.   No facility-administered encounter medications on file as of 12/17/2018.     Surgical History: Past Surgical History:  Procedure Laterality Date  . 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 CHOLECYSTECTOMY    . CORONARY ARTERY BYPASS GRAFT      Medical History: Past Medical History:  Diagnosis Date  . Anemia   . CAD (coronary artery disease)   . Chest pain   . Coronary artery disease    . Diabetes mellitus without complication ([place])   . Diastolic CHF ([place])   . Esophageal reflux   . Herpes zoster without mention of complication   . Hyperlipidemia   . Hypertension   . Insomnia   . Lumbago   . Lymphedema   . Neuropathy in diabetes ([place])   . Obstructive chronic bronchitis without exacerbation ([place])   . Other malaise and fatigue   . Stroke ([place])   . Varicose veins     Family History: Family History  Problem Relation Age of Onset  . Diabetes Mother   . Hyperlipidemia Mother   . Hypertension Mother   . Diabetes Father   . Hyperlipidemia Father   . Hypertension Father   . CAD Father     Social History   Socioeconomic History  . Marital status: Married    Spouse name: Not on file  . Number of children: Not on file  . Years of education: Not on file  . Highest education level: Not on file  Occupational History  . Not on file  Social Needs  . Financial resource strain: Not on file  . Food insecurity    Worry: Not on file    Inability: Not on file  . Transportation needs    Medical: Not on file    Non-medical: Not on file  Tobacco Use  . Smoking status: Never Smoker  . Smokeless tobacco: Never Used  Substance and Sexual Activity  . Alcohol use: No    Comment: quit alcohol 30 years ago  . Drug use: No  . Sexual activity: Not on file  Lifestyle  . Physical activity    Days per week: Not on file    Minutes per session: Not on file  . Stress: Not on file  Relationships  . Social Herbalist on phone: Not on file    Gets together: Not on file    Attends religious service: Not on file    Active member of club or organization: Not on file    Attends meetings of clubs or organizations: Not on file    Relationship status: Not on file  . Intimate partner violence    Fear of current or ex partner: Not on file    Emotionally abused: Not on file    Physically abused: Not on file    Forced sexual activity: Not on file  Other Topics Concern   . Not on file  Social History Narrative   Lives at home with wife, uses a wheelchair now      Review of Systems  Constitutional: Positive for fatigue. Negative for activity change.  HENT: Negative for congestion and postnasal drip.   Respiratory: Positive for shortness of breath. Negative for cough and chest tightness.  Cardiovascular: Positive for leg swelling. Negative for chest pain and palpitations.  Gastrointestinal: Negative for constipation, diarrhea, nausea and vomiting.  Endocrine: Negative for cold intolerance, heat intolerance, polydipsia and polyuria.       Blood sugars doing well   Musculoskeletal: Positive for arthralgias, back pain and myalgias.  Skin: Positive for rash and wound.  Allergic/Immunologic: Positive for environmental allergies.  Neurological: Negative for dizziness, light-headedness and headaches.  Hematological: Negative for adenopathy.  Psychiatric/Behavioral: Positive for dysphoric mood. Negative for agitation. The patient is nervous/anxious.        Improved. Now treated [facility] for anxiety/depression.    Today's Vitals   12/17/18 1034  BP: 134/84  Pulse: 72  Resp: 16  SpO2: 92%  Weight: (!) 300 lb 9.6 oz (136.4 kg)  Height: 5\' 6"  (1.676 m)   Body mass index is 48.52 kg/m.  Physical Exam Vitals signs and nursing note reviewed.  Constitutional:      General: He is not in acute distress.    Appearance: He is well-developed. He is obese. He is not diaphoretic.  HENT:     Head: Normocephalic and atraumatic.     Nose: Nose normal.     Mouth/Throat:     Pharynx: No oropharyngeal exudate.  Eyes:     Conjunctiva/sclera: Conjunctivae normal.     Pupils: Pupils are equal, round, and reactive to light.  Neck:     Musculoskeletal: Normal range of motion and neck supple.     Thyroid: No thyromegaly.     Vascular: No JVD.     Trachea: No tracheal deviation.  Cardiovascular:     Rate and Rhythm: Normal rate and regular rhythm.     Heart sounds:  Normal heart sounds. No murmur. No friction rub. No gallop.   Pulmonary:     Effort: Pulmonary effort is normal. No respiratory distress.     Breath sounds: Normal breath sounds. No rales.  Chest:     Chest wall: No tenderness.  Musculoskeletal: Normal range of motion.     Comments: Moderate to severe lower leg pain, bilaterally. Worse on the right side, especially after he "twisted it" this morning in the bathroom. Pain is severe from just above the heel and into the calf. Hurts to flex and dorsiflex the right foot. Strength is diminished. Having very difficult time putting any weight on the right foot.   Lymphadenopathy:     Cervical: No cervical adenopathy.  Skin:    General: Skin is warm and dry.     Comments: Bilateral lower extremities wrapped in clean dry, pressure dressings, applied per wound management.   Neurological:     Mental Status: He is alert and oriented to person, place, and time. Mental status is at baseline.     Cranial Nerves: No cranial nerve deficit.  Psychiatric:        Behavior: Behavior normal.        Thought Content: Thought content normal.        Judgment: Judgment normal.   Assessment/Plan: 1. Uncontrolled type 2 diabetes mellitus with hyperglycemia (HCC) - POCT HgB A1C 6.2 today. Continue diabetic medication as prescribed   2. Essential hypertension Stable. Continue bp medication as prescribed   3. Chronic pain disorder May continue to use fentanyl 138mcg/hr patches. New prescription sent to his pharmacy today  - fentaNYL (DURAGESIC) 100 MCG/HR; Place 1 patch onto the skin every 3 (three) days.  Dispense: 10 patch; Refill: 0  4. Lymphedema of both lower extremities Regular visits  with wound care and vein and vascular as scheduled.   General Counseling: Colter verbalizes understanding of the findings of todays visit and agrees with plan of treatment. I have discussed any further diagnostic evaluation that may be needed or ordered today. We also reviewed  his medications today. he has been encouraged to call the office with any questions or concerns that should arise related to todays visit.   Reviewed risks and possible side effects associated with taking opiates, benzodiazepines and other CNS depressants. Combination of these could cause dizziness and drowsiness. Advised patient not to drive or operate machinery when taking these medications, as patient's and other's life can be at risk and will have consequences. Patient verbalized understanding in this matter. Dependence and abuse for these drugs will be monitored closely. A Controlled substance policy and procedure is on file which allows [facility] to order a urine drug screen test at any visit. Patient understands and agrees with the plan  This patient was seen by [name] FNP Collaboration with Dr [name] as a part of collaborative care agreement  Orders Placed This Encounter  Procedures  . POCT HgB A1C    Meds ordered this encounter  Medications  . fentaNYL (DURAGESIC) 100 MCG/HR    Sig: Place 1 patch onto the skin every 3 (three) days.    Dispense:  10 patch    Refill:  0    Change from 75 to 164mcg patches    Order Specific Question:   Supervising Provider    Answer:   [name] [[id number]]    Time spent: 25 Minutes      Dr [name] Internal medicine

## 2018-12-24 ENCOUNTER — Other Ambulatory Visit: Payer: Self-pay

## 2018-12-24 DIAGNOSIS — E1165 Type 2 diabetes mellitus with hyperglycemia: Secondary | ICD-10-CM

## 2018-12-24 MED ORDER — BASAGLAR KWIKPEN 100 UNIT/ML ~~LOC~~ SOPN: PEN_INJECTOR | SUBCUTANEOUS | 1 refills | Status: DC

## 2019-01-06 ENCOUNTER — Other Ambulatory Visit: Payer: Self-pay

## 2019-01-06 ENCOUNTER — Telehealth: Payer: Self-pay

## 2019-01-06 MED ORDER — AZITHROMYCIN 250 MG PO TABS: ORAL_TABLET | ORAL | 0 refills | Status: DC

## 2019-01-06 NOTE — Telephone Encounter (Signed)
Pt has symptoms of bronchitis. As per Quita Skye sent in z pak.

## 2019-01-14 ENCOUNTER — Ambulatory Visit (INDEPENDENT_AMBULATORY_CARE_PROVIDER_SITE_OTHER): Payer: Medicare Other | Admitting: Nurse Practitioner

## 2019-01-14 ENCOUNTER — Other Ambulatory Visit: Payer: Self-pay

## 2019-01-14 ENCOUNTER — Encounter: Payer: Self-pay | Admitting: Nurse Practitioner

## 2019-01-14 VITALS — BP 140/64 | HR 61 | Temp 97.8°F | Resp 16 | Ht 66.0 in | Wt 301.0 lb

## 2019-01-14 DIAGNOSIS — J209 Acute bronchitis, unspecified: Secondary | ICD-10-CM | POA: Diagnosis not present

## 2019-01-14 DIAGNOSIS — J449 Chronic obstructive pulmonary disease, unspecified: Secondary | ICD-10-CM | POA: Diagnosis not present

## 2019-01-14 DIAGNOSIS — G894 Chronic pain syndrome: Secondary | ICD-10-CM | POA: Diagnosis not present

## 2019-01-14 DIAGNOSIS — I10 Essential (primary) hypertension: Secondary | ICD-10-CM | POA: Diagnosis not present

## 2019-01-14 MED ORDER — FENTANYL 100 MCG/HR TD PT72: 1.0000 | MEDICATED_PATCH | TRANSDERMAL | 0 refills | Status: DC

## 2019-01-14 MED ORDER — LEVOFLOXACIN 500 MG PO TABS: 500.0000 mg | ORAL_TABLET | Freq: Every day | ORAL | 0 refills | Status: DC

## 2019-01-14 NOTE — Progress Notes (Signed)
[facility] [place], [place] [address]  Internal MEDICINE  Office Visit Note  Patient Name: Adam Merritt  [id number]  [id number]  Date of Service: 01/14/2019  Chief Complaint  Patient presents with  . Follow-up  . Anemia  . Congestive Heart Failure  . Diabetes  . Gastroesophageal Reflux  . Hypertension  . Hyperlipidemia  . Quality Metric Gaps    diabetic foot exam  . Bronchitis    still feels like he has bronchitis    The patient is c/o increased pain in both legs.Fentanyl patch dose increased to 119mcg at his last visit, changed every 3 days. This dose of medication has helped control a great deal. Best he has felt in a long time .,   He continues to see wound care for chronic and recurring wounds to bilateral lowe legs. They are currently wrapped in clean dry dressings and pressure bandages, applied per wound clinic.  Last week, he was treated with z-pack due to acute bronchitis. He states that it is getting some better. Has completed antibiotic therapy. Feels very fatigued and has little energy. Still having some wheezing and cough.       Current Medication: Outpatient Encounter Medications as of 01/14/2019  Medication Sig  . albuterol (PROVENTIL HFA;VENTOLIN HFA) 108 (90 Base) MCG/ACT inhaler INHALE 1 PUFF INTO THE LUNGS EVERY 6 HOURS AS NEEDED FOR WHEEZING OR SHORTNESS OF BREATH (Patient taking differently: Inhale 1 puff into the lungs every 6 (six) hours as needed for wheezing or shortness of breath. )  . aspirin 325 MG EC tablet Take 325 mg by mouth daily.  Marland Kitchen atorvastatin (LIPITOR) 40 MG tablet Take 40 mg by mouth at bedtime.   . carvedilol (COREG) 25 MG tablet Take 25 mg by mouth 2 (two) times daily with a meal.  . docusate sodium (COLACE) 50 MG capsule Take 50 mg by mouth 2 (two) times daily.  . febuxostat (ULORIC) 40 MG tablet Take 1 tablet (40 mg total) by mouth daily.  . fentaNYL (DURAGESIC) 100 MCG/HR Place 1 patch onto the skin every 3  (three) days.  . furosemide (LASIX) 40 MG tablet Take 1 tablet (40 mg total) by mouth daily.  Marland Kitchen gabapentin (NEURONTIN) 300 MG capsule Take 2 cap in the morning, 1 cap in the evening and 1 cap at night.  . hydrALAZINE (APRESOLINE) 25 MG tablet   . HYDROcodone-acetaminophen (NORCO/VICODIN) 5-325 MG tablet Take 1 tablet by mouth every 6 (six) hours as needed for moderate pain or severe pain.  . indapamide (LOZOL) 2.5 MG tablet Take 1 tablet (2.5 mg total) by mouth daily.  . Insulin Glargine (BASAGLAR KWIKPEN) 100 UNIT/ML SOPN INJECT 30 TO 36 UNITS UNDER THE SKIN EVERY DAY  . ipratropium-albuterol (DUONEB) 0.5-2.5 (3) MG/3ML SOLN Take 3 mLs by nebulization every 4 (four) hours as needed (for shortness of breath).  Marland Kitchen levofloxacin (LEVAQUIN) 500 MG tablet Take 1 tablet (500 mg total) by mouth daily.  . metolazone (ZAROXOLYN) 2.5 MG tablet   . Multiple Vitamin (MULTIVITAMIN WITH MINERALS) TABS tablet Take 1 tablet by mouth daily.  . pentoxifylline (TRENTAL) 400 MG CR tablet TAKE 1 TABLET BY MOUTH THREE TIMES DAILY FOR CLAUDICATION  . potassium chloride SA (K-DUR) 20 MEQ tablet Take 1 tablet (20 mEq total) by mouth 3 (three) times daily.  . prazosin (MINIPRESS) 2 MG capsule   . sennosides-docusate sodium (SENOKOT-S) 8.6-50 MG tablet Take 1-2 tablets by mouth 2 (two) times daily.  . sertraline (ZOLOFT) 100  MG tablet   . traZODone (DESYREL) 50 MG tablet Take 50 mg by mouth at bedtime.  Marland Kitchen VICTOZA 18 MG/3ML SOPN INJECT 1.8MG  INTO THE SKIN DAILY  . [DISCONTINUED] azithromycin (ZITHROMAX) 250 MG tablet [place] as directed for 5 days for bronchitis. (Patient not taking: Reported on 01/14/2019)  . [DISCONTINUED] doxycycline (VIBRAMYCIN) 100 MG capsule TK ONE C PO BID FOR 10 DAYS. DO NOT TK MULTIVITAMIN WITH THIS MEDICATION  . [DISCONTINUED] fentaNYL (DURAGESIC) 100 MCG/HR Place 1 patch onto the skin every 3 (three) days.   No facility-administered encounter medications on file as of 01/14/2019.     Surgical  History: Past Surgical History:  Procedure Laterality Date  . 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 CHOLECYSTECTOMY    . CORONARY ARTERY BYPASS GRAFT      Medical History: Past Medical History:  Diagnosis Date  . Anemia   . CAD (coronary artery disease)   . Chest pain   . Coronary artery disease   . Diabetes mellitus without complication ([place])   . Diastolic CHF ([place])   . Esophageal reflux   . Herpes zoster without mention of complication   . Hyperlipidemia   . Hypertension   . Insomnia   . Lumbago   . Lymphedema   . Neuropathy in diabetes ([place])   . Obstructive chronic bronchitis without exacerbation ([place])   . Other malaise and fatigue   . Stroke ([place])   . Varicose veins     Family History: Family History  Problem Relation Age of Onset  . Diabetes Mother   . Hyperlipidemia Mother   . Hypertension Mother   . Diabetes Father   . Hyperlipidemia Father   . Hypertension Father   . CAD Father     Social History   Socioeconomic History  . Marital status: Married    Spouse name: Not on file  . Number of children: Not on file  . Years of education: Not on file  . Highest education level: Not on file  Occupational History  . Not on file  Social Needs  . Financial resource strain: Not on file  . Food insecurity    Worry: Not on file    Inability: Not on file  . Transportation needs    Medical: Not on file    Non-medical: Not on file  Tobacco Use  . Smoking status: Never Smoker  . Smokeless tobacco: Never Used  Substance and Sexual Activity  . Alcohol use: No    Comment: quit alcohol 30 years ago  . Drug use: No  . Sexual activity: Not on file  Lifestyle  . Physical activity    Days per week: Not on file    Minutes per session: Not on file  . Stress: Not on file  Relationships  . Social Herbalist on phone: Not on file    Gets  together: Not on file    Attends religious service: Not on file    Active member of club or organization: Not on file    Attends meetings of clubs or organizations: Not on file    Relationship status: Not on file  . Intimate partner violence    Fear of current or ex partner: Not on file    Emotionally abused: Not on file    Physically abused: Not on file    Forced sexual activity: Not on  file  Other Topics Concern  . Not on file  Social History Narrative   Lives at home with wife, uses a wheelchair now      Review of Systems  Constitutional: Positive for activity change and fatigue. Negative for chills, fever and unexpected weight change.  HENT: Negative for congestion, postnasal drip, rhinorrhea, sneezing and sore throat.   Respiratory: Positive for cough, shortness of breath and wheezing. Negative for chest tightness.   Cardiovascular: Negative for chest pain and palpitations.  Gastrointestinal: Negative for abdominal pain, constipation, diarrhea, nausea and vomiting.  Endocrine: Negative for cold intolerance, heat intolerance, polydipsia and polyuria.  Musculoskeletal: Positive for arthralgias, gait problem and myalgias. Negative for back pain, joint swelling and neck pain.  Skin: Negative for rash.  Allergic/Immunologic: Positive for environmental allergies.  Neurological: Negative for tremors and numbness.  Hematological: Negative for adenopathy. Does not bruise/bleed easily.  Psychiatric/Behavioral: Negative for behavioral problems (Depression), sleep disturbance and suicidal ideas. The patient is not nervous/anxious.     Today's Vitals   01/14/19 1358  BP: 140/64  Pulse: 61  Resp: 16  Temp: 97.8 F (36.6 C)  SpO2: 96%  Weight: (!) 301 lb (136.5 kg)  Height: 5\' 6"  (1.676 m)   Body mass index is 48.58 kg/m. Physical Exam Vitals signs and nursing note reviewed.  Constitutional:      General: He is not in acute distress.    Appearance: He is well-developed. He is  obese. He is ill-appearing. He is not diaphoretic.  HENT:     Head: Normocephalic and atraumatic.     Nose: Nose normal.     Mouth/Throat:     Pharynx: No oropharyngeal exudate.  Eyes:     Conjunctiva/sclera: Conjunctivae normal.     Pupils: Pupils are equal, round, and reactive to light.  Neck:     Musculoskeletal: Normal range of motion and neck supple.     Thyroid: No thyromegaly.     Vascular: No JVD.     Trachea: No tracheal deviation.  Cardiovascular:     Rate and Rhythm: Normal rate and regular rhythm.     Heart sounds: Normal heart sounds. No murmur. No friction rub. No gallop.   Pulmonary:     Effort: Pulmonary effort is normal. No respiratory distress.     Breath sounds: Wheezing and rhonchi present. No rales.     Comments: Congested cough.  Chest:     Chest wall: No tenderness.  Musculoskeletal: Normal range of motion.     Comments: Moderate to severe lower leg pain, bilaterally. Worse on the right side, especially after he "twisted it" this morning in the bathroom. Pain is severe from just above the heel and into the calf. Hurts to flex and dorsiflex the right foot. Strength is diminished. Having very difficult time putting any weight on the right foot.   Lymphadenopathy:     Cervical: No cervical adenopathy.  Skin:    General: Skin is warm and dry.     Comments: Bilateral lower extremities wrapped in clean dry, pressure dressings, applied per wound management.   Neurological:     Mental Status: He is alert and oriented to person, place, and time. Mental status is at baseline.     Cranial Nerves: No cranial nerve deficit.  Psychiatric:        Behavior: Behavior normal.        Thought Content: Thought content normal.        Judgment: Judgment normal.   Assessment/Plan: 1. Acute  bronchitis, unspecified organism Start levaquin 500mg  daily for next 7 days. Rest and increase fluids.  - levofloxacin (LEVAQUIN) 500 MG tablet; Take 1 tablet (500 mg total) by mouth daily.   Dispense: 7 tablet; Refill: 0  2. Chronic obstructive pulmonary disease, unspecified COPD type ([place]) Continue inhalers and respiratory medications as prescribed   3. Chronic pain disorder May continue fentanyl 178mcg/hr patch. Change every three days. New prescription sent to pharmacy.  - fentaNYL (DURAGESIC) 100 MCG/HR; Place 1 patch onto the skin every 3 (three) days.  Dispense: 10 patch; Refill: 0  4. Essential hypertension Stable.   General Counseling: Khyre verbalizes understanding of the findings of todays visit and agrees with plan of treatment. I have discussed any further diagnostic evaluation that may be needed or ordered today. We also reviewed his medications today. he has been encouraged to call the office with any questions or concerns that should arise related to todays visit.  Reviewed risks and possible side effects associated with taking opiates, benzodiazepines and other CNS depressants. Combination of these could cause dizziness and drowsiness. Advised patient not to drive or operate machinery when taking these medications, as patient's and other's life can be at risk and will have consequences. Patient verbalized understanding in this matter. Dependence and abuse for these drugs will be monitored closely. A Controlled substance policy and procedure is on file which allows [facility] to order a urine drug screen test at any visit. Patient understands and agrees with the plan  This patient was seen by [name] FNP Collaboration with Dr [name] as a part of collaborative care agreement  Meds ordered this encounter  Medications  . levofloxacin (LEVAQUIN) 500 MG tablet    Sig: Take 1 tablet (500 mg total) by mouth daily.    Dispense:  7 tablet    Refill:  0    Order Specific Question:   Supervising Provider    Answer:   [name] [[id number]]  . fentaNYL (DURAGESIC) 100 MCG/HR    Sig: Place 1 patch onto the skin every 3 (three) days.    Dispense:  10  patch    Refill:  0    Change from 75 to 168mcg patches    Order Specific Question:   Supervising Provider    Answer:   [name] [[id number]]    Time spent: 25 Minutes      Dr [name] Internal medicine

## 2019-01-24 ENCOUNTER — Other Ambulatory Visit: Payer: Self-pay | Admitting: Internal Medicine

## 2019-01-24 DIAGNOSIS — I890 Lymphedema, not elsewhere classified: Secondary | ICD-10-CM

## 2019-02-03 ENCOUNTER — Other Ambulatory Visit: Payer: Self-pay

## 2019-02-03 DIAGNOSIS — I872 Venous insufficiency (chronic) (peripheral): Secondary | ICD-10-CM

## 2019-02-03 MED ORDER — FUROSEMIDE 40 MG PO TABS: 40.0000 mg | ORAL_TABLET | Freq: Every day | ORAL | 1 refills | Status: DC

## 2019-02-07 NOTE — Progress Notes (Deleted)
Patient ID: Adam Merritt, male    DOB: Nov 25, 1945, 73 y.o.   MRN: [id number]  HPI  Adam Merritt is a 73 y/o male with a history of CAD, DM, hyperlipidemia, HTN, stroke, COPD, lymphedema and chronic heart failure.   Echo report from 09/16/16 reviewed and showed an EF of 65-70%.  Has not been admitted or been in the ED in the last 6 months.   He presents today for a follow-up visit with a chief complaint of   Past Medical History:  Diagnosis Date  . Anemia   . CAD (coronary artery disease)   . Chest pain   . Coronary artery disease   . Diabetes mellitus without complication ([place])   . Diastolic CHF ([place])   . Esophageal reflux   . Herpes zoster without mention of complication   . Hyperlipidemia   . Hypertension   . Insomnia   . Lumbago   . Lymphedema   . Neuropathy in diabetes ([place])   . Obstructive chronic bronchitis without exacerbation ([place])   . Other malaise and fatigue   . Stroke ([place])   . Varicose veins    Past Surgical History:  Procedure Laterality Date  . 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 CHOLECYSTECTOMY    . CORONARY ARTERY BYPASS GRAFT     Family History  Problem Relation Age of Onset  . Diabetes Mother   . Hyperlipidemia Mother   . Hypertension Mother   . Diabetes Father   . Hyperlipidemia Father   . Hypertension Father   . CAD Father    Social History   Tobacco Use  . Smoking status: Never Smoker  . Smokeless tobacco: Never Used  Substance Use Topics  . Alcohol use: No    Comment: quit alcohol 30 years ago   No Known Allergies     Review of Systems  Constitutional: Positive for fatigue (tire easily). Negative for appetite change.  HENT: Positive for hearing loss. Negative for congestion and sore throat.   Eyes: Negative.   Respiratory: Positive for shortness of breath (with minimal exertion). Negative for chest tightness.    Cardiovascular: Positive for leg swelling. Negative for chest pain and palpitations.  Gastrointestinal: Negative for abdominal distention and abdominal pain.  Endocrine: Negative.   Genitourinary: Negative.   Musculoskeletal: Negative for back pain and neck pain.  Skin: Positive for wound (healing).  Allergic/Immunologic: Negative.   Neurological: Positive for weakness (in legs). Negative for dizziness and light-headedness.  Hematological: Negative for adenopathy. Does not bruise/bleed easily.  Psychiatric/Behavioral: Negative for dysphoric mood and sleep disturbance (sleeping on 2 pillows; oxgyen on 4L at bedtime). The patient is not nervous/anxious.      Physical Exam Vitals signs and nursing note reviewed.  Constitutional:      Appearance: He is well-developed.  HENT:     Head: Normocephalic and atraumatic.  Neck:     Musculoskeletal: Normal range of motion and neck supple.  Cardiovascular:     Rate and Rhythm: Normal rate and regular rhythm.  Pulmonary:     Effort: Pulmonary effort is normal. No respiratory distress.     Breath sounds: No wheezing or rales.  Abdominal:     General: There is no distension.     Palpations: Abdomen is soft.  Musculoskeletal:     Right lower leg: He exhibits no tenderness. Edema (lymphedema) present.  Left lower leg: He exhibits no tenderness. Edema (lymphedema) present.  Skin:    General: Skin is warm and dry.  Neurological:     Mental Status: He is alert and oriented to person, place, and time.  Psychiatric:        Behavior: Behavior normal.    Assessment & Plan:  1: Chronic heart failure with preserved ejection fraction- - NYHA class III - euvolemic today although does have chronic lymphedema - weighing sporadically; reviewed the importance of weighing daily first thing in the morning after using the bathroom, write the weight down and call for an overnight weight gain of >2 pounds or a weekly weight gain of >5 pounds - weight  287 pounds from last visit here 6 months ago - adds salt "on occasion" but does eat out almost every meal because they don't like to cook.  - saw cardiology Adam Merritt) 03/07/18 - BNP 04/16/18 was 29.0   2: HTN- - BP  - saw PCP (Adam Merritt) 01/14/2019 - BMP from 08/08/2018 (from the [place]) reviewed and showed sodium 137, potassium 3.4, creatinine 1.41 and GFR 49  3: DM- - A1c 06/21/2018 was 7.0%  4: COPD-  - supposed to be wearing oxygen at 4L at bedtime but doesn't wear it every night; encouraged him to wear it every night when he goes to bed - saw pulmonologist Adam Merritt) 07/19/2018  5: Lymphedema- - follows at the wound center and was last seen 12/09/2018 - does have compression boots at home that he tries to use when his legs aren't so swollen that they can't fit - has been elevating his legs more than he used to  - going to make an appointment at [facility] once his wound heals completely  Patient did not bring his medications nor a list. Each medication was verbally reviewed with the patient and he was encouraged to bring the bottles to every visit to confirm accuracy of list.

## 2019-02-11 ENCOUNTER — Telehealth: Payer: Self-pay | Admitting: Family

## 2019-02-11 ENCOUNTER — Ambulatory Visit: Payer: Medicare Other | Admitting: Family

## 2019-02-11 NOTE — Telephone Encounter (Signed)
Patient did not show for his [facility] Failure [facility] appointment on 02/11/2019. Will attempt to reschedule.

## 2019-02-13 ENCOUNTER — Ambulatory Visit (INDEPENDENT_AMBULATORY_CARE_PROVIDER_SITE_OTHER): Payer: Medicare Other | Admitting: Nurse Practitioner

## 2019-02-13 ENCOUNTER — Other Ambulatory Visit: Payer: Self-pay

## 2019-02-13 ENCOUNTER — Encounter: Payer: Self-pay | Admitting: Nurse Practitioner

## 2019-02-13 VITALS — BP 176/89 | HR 86 | Resp 16 | Ht 66.0 in | Wt 301.0 lb

## 2019-02-13 DIAGNOSIS — I890 Lymphedema, not elsewhere classified: Secondary | ICD-10-CM | POA: Diagnosis not present

## 2019-02-13 DIAGNOSIS — M25561 Pain in right knee: Secondary | ICD-10-CM | POA: Diagnosis not present

## 2019-02-13 DIAGNOSIS — J440 Chronic obstructive pulmonary disease with acute lower respiratory infection: Secondary | ICD-10-CM | POA: Diagnosis not present

## 2019-02-13 DIAGNOSIS — E1142 Type 2 diabetes mellitus with diabetic polyneuropathy: Secondary | ICD-10-CM

## 2019-02-13 DIAGNOSIS — G894 Chronic pain syndrome: Secondary | ICD-10-CM

## 2019-02-13 DIAGNOSIS — J209 Acute bronchitis, unspecified: Secondary | ICD-10-CM

## 2019-02-13 MED ORDER — CEFUROXIME AXETIL 500 MG PO TABS: 500.0000 mg | ORAL_TABLET | Freq: Two times a day (BID) | ORAL | 0 refills | Status: DC

## 2019-02-13 MED ORDER — FENTANYL 100 MCG/HR TD PT72: 1.0000 | MEDICATED_PATCH | TRANSDERMAL | 0 refills | Status: DC

## 2019-02-13 NOTE — Progress Notes (Signed)
[facility] [place], [place] [address]  Internal MEDICINE  Office Visit Note  Patient Name: Adam Merritt  [id number]  [id number]  Date of Service: 02/23/2019  Chief Complaint  Patient presents with  . Follow-up  . Hypertension  . Hyperlipidemia  . Gastroesophageal Reflux  . Diabetes    The patient is here for follow up visit. He is fatigued with wheezing and congested cough. Cough is deep in his chest. States that symptoms started about 3 days ago and have gradually become worse. He has been using nebulizer treatments at home, but these are not helping his breathing. He is also complaining of severe right knee pain. Hurts mostly when he moves the right leg. Putting weight on the leg is nearly impossible. He denies injury or trauma to the knee. States that he has had gout in the right knee in the past.  Fentanyl patch dose continues at 172mcg. patch changed every 3 days. This dose of medication has helped control a great deal. Best his legs have felt in a long time. He continues to see wound care for chronic and recurring wounds to bilateral lowe legs. They are currently wrapped in clean dry dressings and pressure bandages, applied per wound clinic.       Current Medication: Outpatient Encounter Medications as of 02/13/2019  Medication Sig Note  . albuterol (PROVENTIL HFA;VENTOLIN HFA) 108 (90 Base) MCG/ACT inhaler INHALE 1 PUFF INTO THE LUNGS EVERY 6 HOURS AS NEEDED FOR WHEEZING OR SHORTNESS OF BREATH (Patient taking differently: Inhale 1 puff into the lungs every 6 (six) hours as needed for wheezing or shortness of breath. )   . aspirin 325 MG EC tablet Take 325 mg by mouth daily.   Marland Kitchen atorvastatin (LIPITOR) 40 MG tablet Take 40 mg by mouth at bedtime.    . carvedilol (COREG) 25 MG tablet Take 25 mg by mouth 2 (two) times daily with a meal.   . docusate sodium (COLACE) 50 MG capsule Take 50 mg by mouth 2 (two) times daily.   . febuxostat (ULORIC) 40 MG  tablet Take 1 tablet (40 mg total) by mouth daily.   . fentaNYL (DURAGESIC) 100 MCG/HR Place 1 patch onto the skin every 3 (three) days.   . furosemide (LASIX) 40 MG tablet Take 1 tablet (40 mg total) by mouth daily.   Marland Kitchen gabapentin (NEURONTIN) 300 MG capsule Take 2 cap in the morning, 1 cap in the evening and 1 cap at night.   . hydrALAZINE (APRESOLINE) 25 MG tablet    . indapamide (LOZOL) 2.5 MG tablet TAKE 1 TABLET(2.5 MG) BY MOUTH DAILY   . ipratropium-albuterol (DUONEB) 0.5-2.5 (3) MG/3ML SOLN Take 3 mLs by nebulization every 4 (four) hours as needed (for shortness of breath).   . metolazone (ZAROXOLYN) 2.5 MG tablet    . Multiple Vitamin (MULTIVITAMIN WITH MINERALS) TABS tablet Take 1 tablet by mouth daily.   . pentoxifylline (TRENTAL) 400 MG CR tablet TAKE 1 TABLET BY MOUTH THREE TIMES DAILY FOR CLAUDICATION   . potassium chloride SA (K-DUR) 20 MEQ tablet Take 1 tablet (20 mEq total) by mouth 3 (three) times daily.   . prazosin (MINIPRESS) 2 MG capsule Take 2 mg by mouth at bedtime.    . sennosides-docusate sodium (SENOKOT-S) 8.6-50 MG tablet Take 1-2 tablets by mouth 2 (two) times daily.   . sertraline (ZOLOFT) 100 MG tablet    . traZODone (DESYREL) 50 MG tablet Take 50 mg by mouth at bedtime.   Marland Kitchen  VICTOZA 18 MG/3ML SOPN INJECT 1.8MG  INTO THE SKIN DAILY   . [DISCONTINUED] fentaNYL (DURAGESIC) 100 MCG/HR Place 1 patch onto the skin every 3 (three) days.   . [DISCONTINUED] HYDROcodone-acetaminophen (NORCO/VICODIN) 5-325 MG tablet Take 1 tablet by mouth every 6 (six) hours as needed for moderate pain or severe pain.   . [DISCONTINUED] Insulin Glargine (BASAGLAR KWIKPEN) 100 UNIT/ML SOPN INJECT 30 TO 36 UNITS UNDER THE SKIN EVERY DAY   . [DISCONTINUED] levofloxacin (LEVAQUIN) 500 MG tablet Take 1 tablet (500 mg total) by mouth daily.   Marland Kitchen levofloxacin (LEVAQUIN) 500 MG tablet Take 1 tablet (500 mg total) by mouth daily.   . [DISCONTINUED] cefUROXime (CEFTIN) 500 MG tablet Take 1 tablet (500 mg  total) by mouth 2 (two) times daily with a meal. 02/17/2019: causing negative side effects.    No facility-administered encounter medications on file as of 02/13/2019.     Surgical History: Past Surgical History:  Procedure Laterality Date  . 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 CHOLECYSTECTOMY    . CORONARY ARTERY BYPASS GRAFT      Medical History: Past Medical History:  Diagnosis Date  . Anemia   . CAD (coronary artery disease)   . Chest pain   . Coronary artery disease   . Diabetes mellitus without complication ([place])   . Diastolic CHF ([place])   . Esophageal reflux   . Herpes zoster without mention of complication   . Hyperlipidemia   . Hypertension   . Insomnia   . Lumbago   . Lymphedema   . Neuropathy in diabetes ([place])   . Obstructive chronic bronchitis without exacerbation ([place])   . Other malaise and fatigue   . Stroke ([place])   . Varicose veins     Family History: Family History  Problem Relation Age of Onset  . Diabetes Mother   . Hyperlipidemia Mother   . Hypertension Mother   . Diabetes Father   . Hyperlipidemia Father   . Hypertension Father   . CAD Father     Social History   Socioeconomic History  . Marital status: Married    Spouse name: Not on file  . Number of children: Not on file  . Years of education: Not on file  . Highest education level: Not on file  Occupational History  . Not on file  Social Needs  . Financial resource strain: Not on file  . Food insecurity    Worry: Not on file    Inability: Not on file  . Transportation needs    Medical: Not on file    Non-medical: Not on file  Tobacco Use  . Smoking status: Never Smoker  . Smokeless tobacco: Never Used  Substance and Sexual Activity  . Alcohol use: No    Comment: quit alcohol 30 years ago  . Drug use: No  . Sexual activity: Not on file  Lifestyle  . Physical  activity    Days per week: Not on file    Minutes per session: Not on file  . Stress: Not on file  Relationships  . Social Herbalist on phone: Not on file    Gets together: Not on file    Attends religious service: Not on file    Active member of club or organization: Not on file    Attends meetings of clubs or organizations: Not on file  Relationship status: Not on file  . Intimate partner violence    Fear of current or ex partner: Not on file    Emotionally abused: Not on file    Physically abused: Not on file    Forced sexual activity: Not on file  Other Topics Concern  . Not on file  Social History Narrative   Lives at home with wife, uses a wheelchair now      Review of Systems  Constitutional: Positive for fatigue and fever. Negative for chills and unexpected weight change.  HENT: Positive for congestion. Negative for postnasal drip, rhinorrhea, sneezing and sore throat.   Respiratory: Positive for cough, shortness of breath and wheezing. Negative for chest tightness.   Cardiovascular: Negative for chest pain and palpitations.       High blood pressure  Gastrointestinal: Positive for nausea. Negative for abdominal pain, constipation, diarrhea and vomiting.  Genitourinary: Negative for dysuria and frequency.  Musculoskeletal: Positive for arthralgias, gait problem and joint swelling. Negative for back pain and neck pain.       Right knee pain. Getting worse. Hurts to put any weight on the knee at all. Having to use a walker to help with mobility.   Skin: Negative for rash.  Neurological: Negative for dizziness, tremors, numbness and headaches.  Hematological: Negative for adenopathy. Does not bruise/bleed easily.  Psychiatric/Behavioral: Positive for dysphoric mood. Negative for behavioral problems (Depression), sleep disturbance and suicidal ideas. The patient is not nervous/anxious.    Today's Vitals   02/13/19 1353  BP: (!) 176/89  Pulse: 86  Resp: 16   SpO2: 94%  Weight: (!) 301 lb (136.5 kg)  Height: 5\' 6"  (1.676 m)   Body mass index is 48.58 kg/m.   Physical Exam Vitals signs and nursing note reviewed.  Constitutional:      Appearance: He is obese. He is ill-appearing.  Neck:     Musculoskeletal: Normal range of motion and neck supple.  Cardiovascular:     Rate and Rhythm: Normal rate and regular rhythm.     Heart sounds: Murmur present.  Pulmonary:     Breath sounds: Wheezing and rhonchi present.     Comments: Congested, non-productive cough present.  Musculoskeletal:     Comments: Severe right knee pain with swelling present. Pain is worse with light palpation. ROM and strength are moderately diminished due to pain and swelling.   Skin:    Comments: Bilateral lower legs are currently wrapped in pressure dressings.   Neurological:     Mental Status: He is alert and oriented to person, place, and time. Mental status is at baseline.  Psychiatric:        Attention and Perception: Attention and perception normal.        Mood and Affect: Mood is depressed.        Speech: Speech normal.        Behavior: Behavior is cooperative.        Thought Content: Thought content normal.        Cognition and Memory: Cognition normal.        Judgment: Judgment normal.    Assessment/Plan: 1. Acute bronchitis with COPD ([place]) Start levofloacin 500mg  daily. Rest. Use neb treatments and inhalers as needed and as prescribed to relieve any shortness of breath and wheezing.  - levofloxacin (LEVAQUIN) 500 MG tablet; Take 1 tablet (500 mg total) by mouth daily.  Dispense: 10 tablet; Refill: 0  2. Acute pain of right knee Will get x-ray of the right  knee for further evaluation. Refer to orthopedics as indicated.  - DG Knee Complete 4 Views Right; Future  3. Chronic pain disorder Continue to use fentanyl patch as prescribed. A new prescription was sent to the pharmacy.  - fentaNYL (DURAGESIC) 100 MCG/HR; Place 1 patch onto the skin every 3  (three) days.  Dispense: 10 patch; Refill: 0  4. Lymphedema of both lower extremities Regular visits with vein and vascular as scheduled.   5. Diabetic polyneuropathy associated with type 2 diabetes mellitus ([place]) Continue diabetic medication and gabapentin as prescribed   General Counseling: Ronan verbalizes understanding of the findings of todays visit and agrees with plan of treatment. I have discussed any further diagnostic evaluation that may be needed or ordered today. We also reviewed his medications today. he has been encouraged to call the office with any questions or concerns that should arise related to todays visit.  Reviewed risks and possible side effects associated with taking opiates, benzodiazepines and other CNS depressants. Combination of these could cause dizziness and drowsiness. Advised patient not to drive or operate machinery when taking these medications, as patient's and other's life can be at risk and will have consequences. Patient verbalized understanding in this matter. Dependence and abuse for these drugs will be monitored closely. A Controlled substance policy and procedure is on file which allows [facility] to order a urine drug screen test at any visit. Patient understands and agrees with the plan  This patient was seen by [name] FNP Collaboration with Dr [name] as a part of collaborative care agreement  Orders Placed This Encounter  Procedures  . DG Knee Complete 4 Views Right    Meds ordered this encounter  Medications  . fentaNYL (DURAGESIC) 100 MCG/HR    Sig: Place 1 patch onto the skin every 3 (three) days.    Dispense:  10 patch    Refill:  0    Change from 75 to 16mcg patches    Order Specific Question:   Supervising Provider    Answer:   [name] [[id number]]  . DISCONTD: cefUROXime (CEFTIN) 500 MG tablet    Sig: Take 1 tablet (500 mg total) by mouth 2 (two) times daily with a meal.    Dispense:  20 tablet    Refill:   0    Order Specific Question:   Supervising Provider    Answer:   [name] [place]  . levofloxacin (LEVAQUIN) 500 MG tablet    Sig: Take 1 tablet (500 mg total) by mouth daily.    Dispense:  10 tablet    Refill:  0    Please d/c ceftin. Causing patient to feel nauseated.    Order Specific Question:   Supervising Provider    Answer:   [name] [[id number]]    Time spent: 36 Minutes      Dr [name] Internal medicine

## 2019-02-14 ENCOUNTER — Encounter: Payer: Self-pay | Admitting: Emergency Medicine

## 2019-02-14 ENCOUNTER — Other Ambulatory Visit: Payer: Self-pay

## 2019-02-14 DIAGNOSIS — Z79899 Other long term (current) drug therapy: Secondary | ICD-10-CM | POA: Diagnosis not present

## 2019-02-14 DIAGNOSIS — N183 Chronic kidney disease, stage 3 (moderate): Secondary | ICD-10-CM | POA: Insufficient documentation

## 2019-02-14 DIAGNOSIS — Z8673 Personal history of transient ischemic attack (TIA), and cerebral infarction without residual deficits: Secondary | ICD-10-CM | POA: Diagnosis not present

## 2019-02-14 DIAGNOSIS — I2510 Atherosclerotic heart disease of native coronary artery without angina pectoris: Secondary | ICD-10-CM | POA: Insufficient documentation

## 2019-02-14 DIAGNOSIS — R110 Nausea: Secondary | ICD-10-CM | POA: Diagnosis present

## 2019-02-14 DIAGNOSIS — I130 Hypertensive heart and chronic kidney disease with heart failure and stage 1 through stage 4 chronic kidney disease, or unspecified chronic kidney disease: Secondary | ICD-10-CM | POA: Diagnosis not present

## 2019-02-14 DIAGNOSIS — Z794 Long term (current) use of insulin: Secondary | ICD-10-CM | POA: Insufficient documentation

## 2019-02-14 DIAGNOSIS — E1122 Type 2 diabetes mellitus with diabetic chronic kidney disease: Secondary | ICD-10-CM | POA: Insufficient documentation

## 2019-02-14 DIAGNOSIS — R112 Nausea with vomiting, unspecified: Secondary | ICD-10-CM | POA: Insufficient documentation

## 2019-02-14 DIAGNOSIS — I509 Heart failure, unspecified: Secondary | ICD-10-CM | POA: Diagnosis not present

## 2019-02-14 DIAGNOSIS — Z7982 Long term (current) use of aspirin: Secondary | ICD-10-CM | POA: Diagnosis not present

## 2019-02-14 DIAGNOSIS — J449 Chronic obstructive pulmonary disease, unspecified: Secondary | ICD-10-CM | POA: Diagnosis not present

## 2019-02-14 LAB — COMPREHENSIVE METABOLIC PANEL
ALT: 17 U/L (ref 0–44)
AST: 24 U/L (ref 15–41)
Albumin: 4.1 g/dL (ref 3.5–5.0)
Alkaline Phosphatase: 83 U/L (ref 38–126)
Anion gap: 14 (ref 5–15)
BUN: 21 mg/dL (ref 8–23)
CO2: 27 mmol/L (ref 22–32)
Calcium: 9.4 mg/dL (ref 8.9–10.3)
Chloride: 95 mmol/L — ABNORMAL LOW (ref 98–111)
Creatinine, Ser: 1.93 mg/dL — ABNORMAL HIGH (ref 0.61–1.24)
GFR calc Af Amer: 39 mL/min — ABNORMAL LOW (ref >60)
GFR calc non Af Amer: 33 mL/min — ABNORMAL LOW (ref >60)
Glucose, Bld: 191 mg/dL — ABNORMAL HIGH (ref 70–99)
Potassium: 3.8 mmol/L (ref 3.5–5.1)
Sodium: 136 mmol/L (ref 135–145)
Total Bilirubin: 1 mg/dL (ref 0.3–1.2)
Total Protein: 8.1 g/dL (ref 6.5–8.1)

## 2019-02-14 LAB — CBC
HCT: 41.1 % (ref 39.0–52.0)
Hemoglobin: 14.2 g/dL (ref 13.0–17.0)
MCH: 32.2 pg (ref 26.0–34.0)
MCHC: 34.5 g/dL (ref 30.0–36.0)
MCV: 93.2 fL (ref 80.0–100.0)
Platelets: 166 10*3/uL (ref 150–400)
RBC: 4.41 MIL/uL (ref 4.22–5.81)
RDW: 11.9 % (ref 11.5–15.5)
WBC: 11.9 10*3/uL — ABNORMAL HIGH (ref 4.0–10.5)
nRBC: 0 % (ref 0.0–0.2)

## 2019-02-14 LAB — LIPASE, BLOOD: Lipase: 35 U/L (ref 11–51)

## 2019-02-14 MED ORDER — SODIUM CHLORIDE 0.9% FLUSH: 3.0000 mL | Freq: Once | INTRAVENOUS | Status: DC

## 2019-02-14 MED ORDER — ONDANSETRON HCL 4 MG/2ML IJ SOLN
4.0000 mg | Freq: Once | INTRAMUSCULAR | Status: AC | PRN
  Administered 2019-02-14: 4 mg via INTRAVENOUS
  Filled 2019-02-14: qty 2

## 2019-02-14 NOTE — ED Triage Notes (Signed)
Pt presents to triage room diaphoretic, nausea and vomiting reports started with N/V this afternoon around 4pm. Denies any chest pain denies any shortness of breath

## 2019-02-15 ENCOUNTER — Emergency Department
Admission: EM | Admit: 2019-02-15 | Discharge: 2019-02-15 | Disposition: A | Discharge status: Home / Self Care | Payer: Medicare Other | Attending: Emergency Medicine | Admitting: Emergency Medicine

## 2019-02-15 DIAGNOSIS — R112 Nausea with vomiting, unspecified: Secondary | ICD-10-CM

## 2019-02-15 LAB — URINALYSIS, COMPLETE (UACMP) WITH MICROSCOPIC
Bacteria, UA: NONE SEEN
Bilirubin Urine: NEGATIVE
Glucose, UA: NEGATIVE mg/dL
Hgb urine dipstick: NEGATIVE
Ketones, ur: NEGATIVE mg/dL
Leukocytes,Ua: NEGATIVE
Nitrite: NEGATIVE
Protein, ur: NEGATIVE mg/dL
Specific Gravity, Urine: 1.013 (ref 1.005–1.030)
pH: 5 (ref 5.0–8.0)

## 2019-02-15 MED ORDER — ONDANSETRON HCL 4 MG PO TABS: 4.0000 mg | ORAL_TABLET | Freq: Three times a day (TID) | ORAL | 0 refills | Status: DC | PRN

## 2019-02-15 NOTE — Discharge Instructions (Addendum)
Please seek medical attention for any high fevers, chest pain, shortness of breath, change in behavior, persistent vomiting, bloody stool or any other new or concerning symptoms.  

## 2019-02-15 NOTE — ED Provider Notes (Signed)
[facility] Emergency Department Provider Note  ____________________________________________   I have reviewed the triage vital signs and the nursing notes.   HISTORY  Chief Complaint Nausea, Emesis, and Abdominal Pain   History limited by: Not Limited   HPI Adam Merritt is a 73 y.o. male who presents to the emergency department today because of concerns for nausea and some vomiting.  Symptoms started today.  He states that he recently did see his primary care and was put on antibiotics for bronchitis.  He states that he has had nausea and vomiting with previous antibiotic use.  Patient denies any associated abdominal pain.  No diarrhea.  Denies any fevers.  By the time my exam the patient states that his symptoms had improved.  Records reviewed. Per medical record review patient has a history of CAD, DM, CHF, HLD, HTN  Past Medical History:  Diagnosis Date  . Anemia   . CAD (coronary artery disease)   . Chest pain   . Coronary artery disease   . Diabetes mellitus without complication ([place])   . Diastolic CHF ([place])   . Esophageal reflux   . Herpes zoster without mention of complication   . Hyperlipidemia   . Hypertension   . Insomnia   . Lumbago   . Lymphedema   . Neuropathy in diabetes ([place])   . Obstructive chronic bronchitis without exacerbation ([place])   . Other malaise and fatigue   . Stroke ([place])   . Varicose veins     Patient Active Problem List   Diagnosis Date Noted  . Uncontrolled type 2 diabetes mellitus with hyperglycemia ([place]) 09/17/2018  . Primary insomnia 08/07/2018  . Bacteremia due to group B Streptococcus 07/02/2018  . COPD (chronic obstructive pulmonary disease) ([place]) 05/13/2018  . Dysuria 05/05/2018  . Encounter for general adult medical examination with abnormal findings 05/05/2018  . CHF exacerbation ([place]) 04/16/2018  . Achilles tendon disorder, right 04/02/2018  . Essential hypertension 01/28/2018  . Lymphedema of both  lower extremities 01/28/2018  . Cellulitis and abscess of lower extremity 09/20/2017  . Edema of both lower legs due to peripheral venous insufficiency 09/20/2017  . Cellulitis 09/20/2017  . Atopic dermatitis 09/02/2017  . Chronic pain disorder 09/02/2017  . CVA (cerebral vascular accident) ([place]) 09/15/2016  . 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 CVA (cerebral infarction) 02/12/2015  . Chronic diastolic CHF (congestive heart failure), NYHA class 3 ([place]) 11/30/2014  . Benign essential hypertension 11/06/2014  . CAD (coronary artery disease) of artery bypass graft 01/19/2014  . CKD (chronic kidney disease), stage III ([place]) 01/19/2014  . Acute bronchitis 01/19/2014  . Diabetic neuropathy ([place]) 01/19/2014  . Insulin dependent diabetes mellitus ([place]) 01/19/2014  . OSA (obstructive sleep apnea) 01/19/2014  . Mixed hyperlipidemia 10/29/2013  . Peripheral vascular disease, unspecified ([place]) 09/29/2013    Past Surgical History:  Procedure Laterality Date  . 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 CHOLECYSTECTOMY    . CORONARY ARTERY BYPASS GRAFT      Prior to Admission medications   Medication Sig Start Date End Date Taking? Authorizing Provider  albuterol (PROVENTIL HFA;VENTOLIN HFA) 108 (90 Base) MCG/ACT inhaler INHALE 1 PUFF INTO THE LUNGS EVERY 6 HOURS AS NEEDED FOR WHEEZING OR SHORTNESS OF BREATH Patient taking differently: Inhale 1 puff into the lungs every 6 (six) hours as needed for wheezing or shortness of breath.  03/11/18  Allyne Gee, MD  aspirin 325 MG EC tablet Take 325 mg by mouth daily.    [provider]  atorvastatin (LIPITOR) 40 MG tablet Take 40 mg by mouth at bedtime.     [provider]  carvedilol (COREG) 25 MG tablet Take 25 mg by mouth 2 (two) times daily with a meal.    [provider]  cefUROXime (CEFTIN) 500 MG  tablet Take 1 tablet (500 mg total) by mouth 2 (two) times daily with a meal. 02/13/19   Boscia, Greer Ee, NP  docusate sodium (COLACE) 50 MG capsule Take 50 mg by mouth 2 (two) times daily.    [provider]  febuxostat (ULORIC) 40 MG tablet Take 1 tablet (40 mg total) by mouth daily. 02/12/18   [name], MD  fentaNYL (DURAGESIC) 100 MCG/HR Place 1 patch onto the skin every 3 (three) days. 02/13/19   Ronnell Freshwater, NP  furosemide (LASIX) 40 MG tablet Take 1 tablet (40 mg total) by mouth daily. 02/03/19   Ronnell Freshwater, NP  gabapentin (NEURONTIN) 300 MG capsule Take 2 cap in the morning, 1 cap in the evening and 1 cap at night. 11/08/18   Ronnell Freshwater, NP  hydrALAZINE (APRESOLINE) 25 MG tablet  09/13/18   [provider]  indapamide (LOZOL) 2.5 MG tablet TAKE 1 TABLET(2.5 MG) BY MOUTH DAILY 01/24/19   Ronnell Freshwater, NP  Insulin Glargine (BASAGLAR KWIKPEN) 100 UNIT/ML SOPN INJECT 30 TO 36 UNITS UNDER THE SKIN EVERY DAY 12/24/18   Ronnell Freshwater, NP  ipratropium-albuterol (DUONEB) 0.5-2.5 (3) MG/3ML SOLN Take 3 mLs by nebulization every 4 (four) hours as needed (for shortness of breath). 09/10/17   [name], MD  metolazone (ZAROXOLYN) 2.5 MG tablet  09/20/18   [provider]  Multiple Vitamin (MULTIVITAMIN WITH MINERALS) TABS tablet Take 1 tablet by mouth daily.    [provider]  pentoxifylline (TRENTAL) 400 MG CR tablet TAKE 1 TABLET BY MOUTH THREE TIMES DAILY FOR CLAUDICATION 07/29/18   Boscia, Greer Ee, NP  potassium chloride SA (K-DUR) 20 MEQ tablet Take 1 tablet (20 mEq total) by mouth 3 (three) times daily. 10/08/18   Alisa Graff, FNP  prazosin (MINIPRESS) 2 MG capsule Take 2 mg by mouth at bedtime.  10/17/18   [provider]  sennosides-docusate sodium (SENOKOT-S) 8.6-50 MG tablet Take 1-2 tablets by mouth 2 (two) times daily.    [provider]  sertraline (ZOLOFT) 100 MG tablet  10/17/18   [provider]   traZODone (DESYREL) 50 MG tablet Take 50 mg by mouth at bedtime.    [provider]  VICTOZA 18 MG/3ML SOPN INJECT 1.8MG  INTO THE SKIN DAILY 08/23/18   Ronnell Freshwater, NP    Allergies Patient has no known allergies.  Family History  Problem Relation Age of Onset  . Diabetes Mother   . Hyperlipidemia Mother   . Hypertension Mother   . Diabetes Father   . Hyperlipidemia Father   . Hypertension Father   . CAD Father     Social History Social History   Tobacco Use  . Smoking status: Never Smoker  . Smokeless tobacco: Never Used  Substance Use Topics  . Alcohol use: No    Comment: quit alcohol 30 years ago  . Drug use: No    Review of Systems Constitutional: No fever/chills Eyes: No visual changes. ENT: No sore throat. Cardiovascular: Denies chest pain. Respiratory: Positive for bronchitis.  Gastrointestinal: No abdominal pain.  Positive for nausea and vomiting.  Genitourinary: Negative for dysuria. Musculoskeletal: Negative for back pain. Skin: Negative for rash. Neurological: Negative for headaches, focal weakness or numbness.  ____________________________________________   PHYSICAL EXAM:  VITAL SIGNS: ED Triage Vitals  Enc Vitals Group     BP 02/14/19 2112 (!) 177/91     Pulse Rate 02/14/19 2112 91     Resp 02/14/19 2112 20     Temp 02/14/19 2112 98.4 F (36.9 C)     Temp Source 02/14/19 2112 Oral     SpO2 02/14/19 2112 94 %     Weight 02/14/19 2116 300 lb (136.1 kg)     Height 02/14/19 2116 5\' 6"  (1.676 m)     Head Circumference --      Peak Flow --      Pain Score 02/14/19 2116 0   Constitutional: Alert and oriented.  Eyes: Conjunctivae are normal.  ENT      Head: Normocephalic and atraumatic.      Nose: No congestion/rhinnorhea.      Mouth/Throat: Mucous membranes are moist.      Neck: No stridor. Hematological/Lymphatic/Immunilogical: No cervical lymphadenopathy. Cardiovascular: Normal rate, regular rhythm.  No murmurs, rubs, or  gallops.  Respiratory: Normal respiratory effort without tachypnea nor retractions. Breath sounds are clear and equal bilaterally. No wheezes/rales/rhonchi. Gastrointestinal: Soft and non tender. No rebound. No guarding.  Genitourinary: Deferred Musculoskeletal: Normal range of motion in all extremities. No lower extremity edema. Neurologic:  Normal speech and language. No gross focal neurologic deficits are appreciated.  Skin:  Skin is warm, dry and intact. No rash noted. Psychiatric: Mood and affect are normal. Speech and behavior are normal. Patient exhibits appropriate insight and judgment.  ____________________________________________    LABS (pertinent positives/negatives)  Lipase 35 CBC wbc 11.9, hgb 14.2, plt 166 CMP wnl except cl 95, glu 191, cr 1.93 UA hazy, 0-5 rbc and wbc ____________________________________________   EKG  None  ____________________________________________    RADIOLOGY  None  ____________________________________________   PROCEDURES  Procedures  ____________________________________________   INITIAL IMPRESSION / ASSESSMENT AND PLAN / ED COURSE  Pertinent labs & imaging results that were available during my care of the patient were reviewed by me and considered in my medical decision making (see chart for details).   Patient presented to the emergency department today because of concerns for nausea and the vomiting.  This time I examined symptoms had resolved.  Patient's blood work with a very minimal leukocytosis but not greater than 12.  Abdominal exam is benign.  Do wonder if this is secondary to antibiotic use.  Will prescribe Zofran.  Did discuss return precautions.   ____________________________________________   FINAL CLINICAL IMPRESSION(S) / ED DIAGNOSES  Final diagnoses:  Nausea and vomiting, intractability of vomiting not specified, unspecified vomiting type     Note: This dictation was prepared with Dragon dictation.  Any transcriptional errors that result from this process are unintentional     Nance Pear, MD 02/15/19 [phone]

## 2019-02-17 ENCOUNTER — Telehealth: Payer: Self-pay

## 2019-02-17 MED ORDER — LEVOFLOXACIN 500 MG PO TABS: 500.0000 mg | ORAL_TABLET | Freq: Every day | ORAL | 0 refills | Status: DC

## 2019-02-17 NOTE — Telephone Encounter (Signed)
Spoke with patient and patient wife and advised per [name] that we changed his abx and to make sure he discards his other abx so he does not get them mixed up and the nausea is normal due to the strength of abx and also could be from him adding more pain patched then prescribed. Adam Merritt

## 2019-02-19 NOTE — Progress Notes (Signed)
Patient ID: Adam Merritt, male    DOB: May 05, 1946, 73 y.o.   MRN: [id number]  HPI  Adam Merritt is a 73 y/o male with a history of CAD, DM, hyperlipidemia, HTN, stroke, COPD, lymphedema and chronic heart failure.   Echo report from 09/16/16 reviewed and showed an EF of 65-70%.  Went to ED 02/15/2019 for nausea and vomiting where he was treated and released.   He presents today for a follow-up visit with a chief complaint of moderate shortness of breath on minimal exertion. He used a wheelchair [place] medical mall to the office. He states he does get short of breath when he gets dressed. Associated with fatigue, leg swelling that is "better," lightheadedness and weakness. He states he was on an antibiotic last week and finished it yesterday for bronchitis. When he takes antibiotics, his appetite decreases. He also states he has been increasing light-headed the past several days. He is not weight himself at home, although he does have a scale, he has not been checking his blood sugars, and he does not check his blood pressure at home. He does add salt to his food "at times." His wife wraps his legs daily in compression wraps and he states his wounds on his legs have healed.   Past Medical History:  Diagnosis Date  . Anemia   . CAD (coronary artery disease)   . Chest pain   . Coronary artery disease   . Diabetes mellitus without complication ([place])   . Diastolic CHF ([place])   . Esophageal reflux   . Herpes zoster without mention of complication   . Hyperlipidemia   . Hypertension   . Insomnia   . Lumbago   . Lymphedema   . Neuropathy in diabetes ([place])   . Obstructive chronic bronchitis without exacerbation ([name])   . Other malaise and fatigue   . Stroke ([place])   . Varicose veins    Past Surgical History:  Procedure Laterality Date  . AMPUTATION TOE Right 06/23/2018   Procedure: AMPUTATION TOE;  Surgeon: Sharlotte Alamo, DPM;  Location: [facility] ORS;  Service: Podiatry;  Laterality: Right;   . CHOLECYSTECTOMY    . CORONARY ARTERY BYPASS GRAFT     Family History  Problem Relation Age of Onset  . Diabetes Mother   . Hyperlipidemia Mother   . Hypertension Mother   . Diabetes Father   . Hyperlipidemia Father   . Hypertension Father   . CAD Father    Social History   Tobacco Use  . Smoking status: Never Smoker  . Smokeless tobacco: Never Used  Substance Use Topics  . Alcohol use: No    Comment: quit alcohol 30 years ago   No Known Allergies  Prior to Admission medications   Medication Sig Start Date End Date Taking? Authorizing Provider  albuterol (PROVENTIL HFA;VENTOLIN HFA) 108 (90 Base) MCG/ACT inhaler INHALE 1 PUFF INTO THE LUNGS EVERY 6 HOURS AS NEEDED FOR WHEEZING OR SHORTNESS OF BREATH Patient taking differently: Inhale 1 puff into the lungs every 6 (six) hours as needed for wheezing or shortness of breath.  03/11/18  Yes Allyne Gee, MD  aspirin 325 MG EC tablet Take 325 mg by mouth daily.   Yes [provider]  atorvastatin (LIPITOR) 40 MG tablet Take 40 mg by mouth at bedtime.    Yes [provider]  carvedilol (COREG) 25 MG tablet Take 25 mg by mouth 2 (two) times daily with a meal.   Yes  [provider]  docusate sodium (COLACE) 50 MG capsule Take 50 mg by mouth 2 (two) times daily.   Yes [provider]  febuxostat (ULORIC) 40 MG tablet Take 1 tablet (40 mg total) by mouth daily. 02/12/18  Yes [name], MD  fentaNYL (DURAGESIC) 100 MCG/HR Place 1 patch onto the skin every 3 (three) days. 02/13/19  Yes Boscia, Greer Ee, NP  furosemide (LASIX) 40 MG tablet Take 1 tablet (40 mg total) by mouth daily. 02/03/19  Yes Boscia, Greer Ee, NP  gabapentin (NEURONTIN) 300 MG capsule Take 2 cap in the morning, 1 cap in the evening and 1 cap at night. 11/08/18  Yes Ronnell Freshwater, NP  hydrALAZINE (APRESOLINE) 25 MG tablet  09/13/18  Yes [provider]  indapamide (LOZOL) 2.5 MG tablet TAKE 1 TABLET(2.5 MG) BY MOUTH DAILY  01/24/19  Yes Boscia, Heather E, NP  Insulin Glargine (BASAGLAR KWIKPEN) 100 UNIT/ML SOPN INJECT 30 TO 36 UNITS UNDER THE SKIN EVERY DAY 02/21/19  Yes Boscia, Heather E, NP  ipratropium-albuterol (DUONEB) 0.5-2.5 (3) MG/3ML SOLN Take 3 mLs by nebulization every 4 (four) hours as needed (for shortness of breath). 09/10/17  Yes [name], MD  levofloxacin (LEVAQUIN) 500 MG tablet Take 1 tablet (500 mg total) by mouth daily. 02/17/19  Yes Ronnell Freshwater, NP  Multiple Vitamin (MULTIVITAMIN WITH MINERALS) TABS tablet Take 1 tablet by mouth daily.   Yes [provider]  ondansetron (ZOFRAN) 4 MG tablet Take 1 tablet (4 mg total) by mouth every 8 (eight) hours as needed. 02/15/19  Yes Nance Pear, MD  pentoxifylline (TRENTAL) 400 MG CR tablet TAKE 1 TABLET BY MOUTH THREE TIMES DAILY FOR CLAUDICATION 07/29/18  Yes Boscia, Heather E, NP  potassium chloride SA (K-DUR) 20 MEQ tablet Take 1 tablet (20 mEq total) by mouth 3 (three) times daily. 10/08/18  Yes Hackney, Otila Kluver A, FNP  prazosin (MINIPRESS) 2 MG capsule Take 2 mg by mouth at bedtime.  10/17/18  Yes [provider]  sennosides-docusate sodium (SENOKOT-S) 8.6-50 MG tablet Take 1-2 tablets by mouth 2 (two) times daily.   Yes [provider]  sertraline (ZOLOFT) 100 MG tablet  10/17/18  Yes [provider]  traZODone (DESYREL) 50 MG tablet Take 50 mg by mouth at bedtime.   Yes [provider]  VICTOZA 18 MG/3ML SOPN INJECT 1.8MG  INTO THE SKIN DAILY 08/23/18  Yes Ronnell Freshwater, NP  metolazone (ZAROXOLYN) 2.5 MG tablet  09/20/18   [provider]   Review of Systems  Constitutional: Positive for appetite change (decreased while taking antibiotic ) and fatigue (tire easily).  HENT: Positive for hearing loss. Negative for congestion and sore throat.   Eyes: Negative.   Respiratory: Positive for shortness of breath (with minimal exertion). Negative for chest tightness.   Cardiovascular: Positive for  leg swelling. Negative for chest pain and palpitations.  Gastrointestinal: Negative for abdominal distention and abdominal pain.  Endocrine: Negative.   Genitourinary: Negative.   Musculoskeletal: Negative for back pain and neck pain.  Skin: Negative for wound.  Allergic/Immunologic: Negative.   Neurological: Positive for weakness (in legs) and light-headedness. Negative for dizziness.  Hematological: Negative for adenopathy. Does not bruise/bleed easily.  Psychiatric/Behavioral: Negative for dysphoric mood and sleep disturbance (sleeping on 2 pillows; oxgyen on 4L at bedtime although hasn't been wearing recently). The patient is not nervous/anxious.    Vitals:   02/24/19 1219  BP: (!) 106/56  Pulse: 65  Resp: 18  SpO2: 99%   Filed Weights   02/24/19 1219  Weight: 282 lb 8 oz (128.1 kg)   Lab Results  Component Value Date   CREATININE 1.93 (H) 02/14/2019   CREATININE 1.73 (H) 06/25/2018   CREATININE 1.68 (H) 06/24/2018    Physical Exam Vitals signs and nursing note reviewed.  Constitutional:      Appearance: He is well-developed.  HENT:     Head: Normocephalic and atraumatic.  Neck:     Musculoskeletal: Normal range of motion and neck supple.  Cardiovascular:     Rate and Rhythm: Normal rate and regular rhythm.  Pulmonary:     Effort: Pulmonary effort is normal. No respiratory distress.     Breath sounds: No wheezing or rales.  Abdominal:     General: There is no distension.     Palpations: Abdomen is soft.  Musculoskeletal:     Right lower leg: He exhibits no tenderness. Edema (lymphedema) present.     Left lower leg: He exhibits no tenderness. Edema (lymphedema) present.  Skin:    General: Skin is warm and dry.  Neurological:     Mental Status: He is alert and oriented to person, place, and time.  Psychiatric:        Behavior: Behavior normal.    Assessment & Plan:  1: Chronic heart failure with preserved ejection fraction- - NYHA class III - euvolemic  today although does have chronic lymphedema - not weighing himself at home; reviewed the importance of weighing daily first thing in the morning after using the bathroom, write the weight down and call for an overnight weight gain of >2 pounds or a weekly weight gain of >5 pounds - weight stable from last visit 6 months ago - adds salt "on occasion" but does eat out almost every meal because they don't like to cook.  - saw cardiology Lyndel Pleasure) 03/07/18, he states he will make a follow-up appointment soon - BNP 04/16/18 was 29.0  2: HTN- - BP on the softer side today 102/64 recheck manually - Did not bring medication list nor bottles and is not sure of the medications or doses he is taking at home. Obtained a list from [place], however patient also gets medications from the [place]. Asked patient to check the medication list on his AVS with the medications he takes at home and to call us with the medications and doses of every medication he takes. Also asked him to bring his medication bottles with him to every visit. - stop indapamide   - saw PCP Junius Creamer) 01/14/2019 and will see October 8th.  - BMP from 02/14/2019 reviewed and showed sodium 136, potassium 3.8, creatinine 1.93 and GFR 33  3: DM- - A1c 12/17/2018 was 6.2% - CBG checked here and was 200. Did not eat this morning but did drink regular coke.  - Has not been checking sugar at home  4: COPD-  - supposed to be wearing oxygen at 4L at bedtime but doesn't wear it every night; encouraged him to wear it every night when he goes to bed - saw pulmonologist Versie Starks) 07/19/2018  5: Lymphedema- - follows at the wound center  - does have compression boots at home that he tries to use when his legs aren't so swollen that they can't fit - has been elevating his legs more than he used to   Patient did not bring his medications nor a list. Each medication was verbally reviewed with the patient and he was encouraged to bring the  bottles to every  visit to confirm accuracy of list.  Return in 1 month or sooner for any questions/problems before then.

## 2019-02-21 ENCOUNTER — Other Ambulatory Visit: Payer: Self-pay

## 2019-02-21 DIAGNOSIS — E1165 Type 2 diabetes mellitus with hyperglycemia: Secondary | ICD-10-CM

## 2019-02-21 MED ORDER — BASAGLAR KWIKPEN 100 UNIT/ML ~~LOC~~ SOPN: PEN_INJECTOR | SUBCUTANEOUS | 1 refills | Status: DC

## 2019-02-23 DIAGNOSIS — M25561 Pain in right knee: Secondary | ICD-10-CM | POA: Insufficient documentation

## 2019-02-24 ENCOUNTER — Encounter: Payer: Self-pay | Admitting: Family

## 2019-02-24 ENCOUNTER — Other Ambulatory Visit: Payer: Self-pay

## 2019-02-24 ENCOUNTER — Ambulatory Visit: Payer: Medicare Other | Attending: Family | Admitting: Family

## 2019-02-24 VITALS — BP 106/56 | HR 65 | Resp 18 | Ht 66.0 in | Wt 282.5 lb

## 2019-02-24 DIAGNOSIS — G4700 Insomnia, unspecified: Secondary | ICD-10-CM | POA: Insufficient documentation

## 2019-02-24 DIAGNOSIS — I2510 Atherosclerotic heart disease of native coronary artery without angina pectoris: Secondary | ICD-10-CM | POA: Diagnosis not present

## 2019-02-24 DIAGNOSIS — E1140 Type 2 diabetes mellitus with diabetic neuropathy, unspecified: Secondary | ICD-10-CM | POA: Diagnosis not present

## 2019-02-24 DIAGNOSIS — Z951 Presence of aortocoronary bypass graft: Secondary | ICD-10-CM | POA: Insufficient documentation

## 2019-02-24 DIAGNOSIS — Z79899 Other long term (current) drug therapy: Secondary | ICD-10-CM | POA: Insufficient documentation

## 2019-02-24 DIAGNOSIS — Z8249 Family history of ischemic heart disease and other diseases of the circulatory system: Secondary | ICD-10-CM | POA: Insufficient documentation

## 2019-02-24 DIAGNOSIS — E785 Hyperlipidemia, unspecified: Secondary | ICD-10-CM | POA: Insufficient documentation

## 2019-02-24 DIAGNOSIS — K219 Gastro-esophageal reflux disease without esophagitis: Secondary | ICD-10-CM | POA: Insufficient documentation

## 2019-02-24 DIAGNOSIS — I5032 Chronic diastolic (congestive) heart failure: Secondary | ICD-10-CM | POA: Insufficient documentation

## 2019-02-24 DIAGNOSIS — I10 Essential (primary) hypertension: Secondary | ICD-10-CM

## 2019-02-24 DIAGNOSIS — Z8673 Personal history of transient ischemic attack (TIA), and cerebral infarction without residual deficits: Secondary | ICD-10-CM | POA: Diagnosis not present

## 2019-02-24 DIAGNOSIS — Z7982 Long term (current) use of aspirin: Secondary | ICD-10-CM | POA: Diagnosis not present

## 2019-02-24 DIAGNOSIS — I110 Hypertensive heart disease with heart failure: Secondary | ICD-10-CM | POA: Insufficient documentation

## 2019-02-24 DIAGNOSIS — IMO0001 Reserved for inherently not codable concepts without codable children: Secondary | ICD-10-CM

## 2019-02-24 DIAGNOSIS — Z794 Long term (current) use of insulin: Secondary | ICD-10-CM | POA: Diagnosis not present

## 2019-02-24 DIAGNOSIS — I890 Lymphedema, not elsewhere classified: Secondary | ICD-10-CM | POA: Insufficient documentation

## 2019-02-24 DIAGNOSIS — J449 Chronic obstructive pulmonary disease, unspecified: Secondary | ICD-10-CM | POA: Insufficient documentation

## 2019-02-24 LAB — GLUCOSE, CAPILLARY: Glucose-Capillary: 200 mg/dL — ABNORMAL HIGH (ref 70–99)

## 2019-02-24 NOTE — Patient Instructions (Signed)
Continue weighing daily and call for an overnight weight gain of >2 pounds or a weekly weight gain of >5 pounds.  Stop taking Indapamide

## 2019-03-06 ENCOUNTER — Telehealth: Payer: Self-pay | Admitting: Family

## 2019-03-06 MED ORDER — INDAPAMIDE 2.5 MG PO TABS: 2.5000 mg | ORAL_TABLET | Freq: Every day | ORAL | 5 refills | Status: DC

## 2019-03-06 NOTE — Telephone Encounter (Signed)
Received phone call from patient's wife regarding the swelling in his legs. He was last seen in the office on 02/24/2019 and due to his soft BP & dizziness, his indapamide was stopped.   She says that his swelling in his legs have worsened and he's getting a few blisters on his legs. Advised her to resume taking the indapamide and to make sure that he's weighing himself daily. He sees his PCP next week and then a return to [place] later in October.   If his BP remains low and we get an accurate medication list, may need to try decreasing/stopping something else.   Patient's wife verbalized understanding of resuming the indapamide (2.5mg  daily).

## 2019-03-13 ENCOUNTER — Other Ambulatory Visit: Payer: Self-pay

## 2019-03-13 ENCOUNTER — Encounter: Payer: Self-pay | Admitting: Nurse Practitioner

## 2019-03-13 ENCOUNTER — Ambulatory Visit (INDEPENDENT_AMBULATORY_CARE_PROVIDER_SITE_OTHER): Payer: Medicare Other | Admitting: Nurse Practitioner

## 2019-03-13 VITALS — BP 160/92 | HR 72 | Temp 97.1°F | Resp 16 | Ht 66.0 in | Wt 282.5 lb

## 2019-03-13 DIAGNOSIS — G894 Chronic pain syndrome: Secondary | ICD-10-CM

## 2019-03-13 DIAGNOSIS — I890 Lymphedema, not elsewhere classified: Secondary | ICD-10-CM

## 2019-03-13 DIAGNOSIS — E1165 Type 2 diabetes mellitus with hyperglycemia: Secondary | ICD-10-CM | POA: Diagnosis not present

## 2019-03-13 DIAGNOSIS — D619 Aplastic anemia, unspecified: Secondary | ICD-10-CM | POA: Diagnosis not present

## 2019-03-13 DIAGNOSIS — E1142 Type 2 diabetes mellitus with diabetic polyneuropathy: Secondary | ICD-10-CM

## 2019-03-13 DIAGNOSIS — J449 Chronic obstructive pulmonary disease, unspecified: Secondary | ICD-10-CM

## 2019-03-13 LAB — POCT GLYCOSYLATED HEMOGLOBIN (HGB A1C): Hemoglobin A1C: 6.3 % — AB (ref 4.0–5.6)

## 2019-03-13 MED ORDER — FENTANYL 100 MCG/HR TD PT72: 1.0000 | MEDICATED_PATCH | TRANSDERMAL | 0 refills | Status: DC

## 2019-03-13 MED ORDER — GABAPENTIN 300 MG PO CAPS: ORAL_CAPSULE | ORAL | 3 refills | Status: DC

## 2019-03-13 NOTE — Progress Notes (Signed)
[facility] [place], [place] [address]  Internal MEDICINE  Office Visit Note  Patient Name: Adam Merritt  [id number]  [id number]  Date of Service: 03/19/2019  No chief complaint on file.   The patient is here for follow up. He continues to use Fentanyl patch dose continues at 156mcg. patch changed every 3 days. This dose of medication has helped control his pain a great deal. Best his legs have felt in a long time. He is due to have a refill of this today.  The patient states that his legs became so swollen the other day, they opened up and started to drain again. They are currently wrapped in pressure dressings. He is to see wound center on Tuesday and restart wound care. He is due to have routine, fasting labs. His blood sugars continue to do well. His Hgba1c is 6.3.       Current Medication: Outpatient Encounter Medications as of 03/13/2019  Medication Sig  . albuterol (PROVENTIL HFA;VENTOLIN HFA) 108 (90 Base) MCG/ACT inhaler INHALE 1 PUFF INTO THE LUNGS EVERY 6 HOURS AS NEEDED FOR WHEEZING OR SHORTNESS OF BREATH (Patient taking differently: Inhale 1 puff into the lungs every 6 (six) hours as needed for wheezing or shortness of breath. )  . aspirin 325 MG EC tablet Take 325 mg by mouth daily.  Marland Kitchen atorvastatin (LIPITOR) 40 MG tablet Take 40 mg by mouth at bedtime.   . carvedilol (COREG) 25 MG tablet Take 25 mg by mouth 2 (two) times daily with a meal.  . docusate sodium (COLACE) 50 MG capsule Take 50 mg by mouth 2 (two) times daily.  . fentaNYL (DURAGESIC) 100 MCG/HR Place 1 patch onto the skin every 3 (three) days.  . furosemide (LASIX) 40 MG tablet Take 1 tablet (40 mg total) by mouth daily.  Marland Kitchen gabapentin (NEURONTIN) 300 MG capsule Take 2 cap in the morning, 1 cap in the evening and 2 cap at night.  . hydrALAZINE (APRESOLINE) 25 MG tablet   . indapamide (LOZOL) 2.5 MG tablet Take 1 tablet (2.5 mg total) by mouth daily.  . Insulin Glargine (BASAGLAR  KWIKPEN) 100 UNIT/ML SOPN INJECT 30 TO 36 UNITS UNDER THE SKIN EVERY DAY  . ipratropium-albuterol (DUONEB) 0.5-2.5 (3) MG/3ML SOLN Take 3 mLs by nebulization every 4 (four) hours as needed (for shortness of breath).  . metolazone (ZAROXOLYN) 2.5 MG tablet   . Multiple Vitamin (MULTIVITAMIN WITH MINERALS) TABS tablet Take 1 tablet by mouth daily.  . ondansetron (ZOFRAN) 4 MG tablet Take 1 tablet (4 mg total) by mouth every 8 (eight) hours as needed.  . pentoxifylline (TRENTAL) 400 MG CR tablet TAKE 1 TABLET BY MOUTH THREE TIMES DAILY FOR CLAUDICATION  . potassium chloride SA (K-DUR) 20 MEQ tablet Take 1 tablet (20 mEq total) by mouth 3 (three) times daily. (Patient taking differently: Take 20 mEq by mouth 2 (two) times daily. )  . prazosin (MINIPRESS) 2 MG capsule Take 2 mg by mouth at bedtime.   . sennosides-docusate sodium (SENOKOT-S) 8.6-50 MG tablet Take 1-2 tablets by mouth 2 (two) times daily.  . sertraline (ZOLOFT) 100 MG tablet   . traZODone (DESYREL) 50 MG tablet Take 50 mg by mouth at bedtime.  Marland Kitchen VICTOZA 18 MG/3ML SOPN INJECT 1.8MG  INTO THE SKIN DAILY  . [DISCONTINUED] febuxostat (ULORIC) 40 MG tablet Take 1 tablet (40 mg total) by mouth daily.  . [DISCONTINUED] fentaNYL (DURAGESIC) 100 MCG/HR Place 1 patch onto the skin every 3 (three)  days.  . [DISCONTINUED] gabapentin (NEURONTIN) 300 MG capsule Take 2 cap in the morning, 1 cap in the evening and 1 cap at night.  . [DISCONTINUED] levofloxacin (LEVAQUIN) 500 MG tablet Take 1 tablet (500 mg total) by mouth daily.   No facility-administered encounter medications on file as of 03/13/2019.     Surgical History: Past Surgical History:  Procedure Laterality Date  . 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 CHOLECYSTECTOMY    . CORONARY ARTERY BYPASS GRAFT      Medical History: Past Medical History:  Diagnosis Date  . Anemia    . CAD (coronary artery disease)   . Chest pain   . Coronary artery disease   . Diabetes mellitus without complication ([place])   . Diastolic CHF ([place])   . Esophageal reflux   . Herpes zoster without mention of complication   . Hyperlipidemia   . Hypertension   . Insomnia   . Lumbago   . Lymphedema   . Neuropathy in diabetes ([place])   . Obstructive chronic bronchitis without exacerbation ([place])   . Other malaise and fatigue   . Stroke ([place])   . Varicose veins     Family History: Family History  Problem Relation Age of Onset  . Diabetes Mother   . Hyperlipidemia Mother   . Hypertension Mother   . Diabetes Father   . Hyperlipidemia Father   . Hypertension Father   . CAD Father     Social History   Socioeconomic History  . Marital status: Married    Spouse name: Not on file  . Number of children: Not on file  . Years of education: Not on file  . Highest education level: Not on file  Occupational History  . Not on file  Social Needs  . Financial resource strain: Not on file  . Food insecurity    Worry: Not on file    Inability: Not on file  . Transportation needs    Medical: Not on file    Non-medical: Not on file  Tobacco Use  . Smoking status: Never Smoker  . Smokeless tobacco: Never Used  Substance and Sexual Activity  . Alcohol use: No    Comment: quit alcohol 30 years ago  . Drug use: No  . Sexual activity: Not on file  Lifestyle  . Physical activity    Days per week: Not on file    Minutes per session: Not on file  . Stress: Not on file  Relationships  . Social Herbalist on phone: Not on file    Gets together: Not on file    Attends religious service: Not on file    Active member of club or organization: Not on file    Attends meetings of clubs or organizations: Not on file    Relationship status: Not on file  . Intimate partner violence    Fear of current or ex partner: Not on file    Emotionally abused: Not on file    Physically  abused: Not on file    Forced sexual activity: Not on file  Other Topics Concern  . Not on file  Social History Narrative   Lives at home with wife, uses a wheelchair now      Review of Systems  Constitutional: Positive for fatigue. Negative for activity change.  HENT: Negative for congestion and postnasal  drip.   Respiratory: Positive for shortness of breath. Negative for cough and chest tightness.   Cardiovascular: Positive for leg swelling. Negative for chest pain and palpitations.  Gastrointestinal: Negative for constipation, diarrhea, nausea and vomiting.  Endocrine: Negative for cold intolerance, heat intolerance, polydipsia and polyuria.       Blood sugars doing well   Musculoskeletal: Positive for arthralgias, back pain and myalgias.  Skin: Positive for rash and wound.  Allergic/Immunologic: Positive for environmental allergies.  Neurological: Negative for dizziness, light-headedness and headaches.  Hematological: Negative for adenopathy.  Psychiatric/Behavioral: Positive for dysphoric mood. Negative for agitation. The patient is nervous/anxious.        Improved. [facility]depression.    Today's Vitals   03/13/19 1105  BP: (!) 160/92  Pulse: 72  Resp: 16  Temp: (!) 97.1 F (36.2 C)  SpO2: 97%  Height: 5\' 6"  (1.676 m)   Body mass index is 45.6 kg/m.  Physical Exam Vitals signs and nursing note reviewed.  Constitutional:      Appearance: Normal appearance. He is obese. He is not ill-appearing.  Neck:     Musculoskeletal: Normal range of motion and neck supple.  Cardiovascular:     Rate and Rhythm: Normal rate and regular rhythm.     Heart sounds: Murmur present.  Pulmonary:     Effort: Pulmonary effort is normal.     Breath sounds: Normal breath sounds.     Comments: Congested, non-productive cough present.  Skin:    Comments: Both lower legs are currently wrapped in clean, dry pressure dressings.   Neurological:     Mental Status: He  is alert and oriented to person, place, and time. Mental status is at baseline.  Psychiatric:        Attention and Perception: Attention and perception normal.        Mood and Affect: Mood is depressed.        Speech: Speech normal.        Behavior: Behavior is cooperative.        Thought Content: Thought content normal.        Cognition and Memory: Cognition normal.        Judgment: Judgment normal.    Assessment/Plan:  1. Type 2 diabetes mellitus with hyperglycemia, unspecified whether long term insulin use (HCC) - POCT HgB A1C  6.3 today. Continue all diabetic medication as prescribed.   2. Chronic pain disorder Doing better with fentanyl patch. Continue as prescribed. New prescription sent to his pharmacy  - fentaNYL (DURAGESIC) 100 MCG/HR; Place 1 patch onto the skin every 3 (three) days.  Dispense: 10 patch; Refill: 0  3. Aplastic anemia, unspecified ([place]) Will check labs CBC and full anemia panel for further evaluation. Has seen hematology in the past when needed.   4. Chronic obstructive pulmonary disease, unspecified COPD type ([place]) Stable. Continue inhalers and respiratory medication as prescribed.   5. Lymphedema of both lower extremities Follow up with wound care and vein and vascular providers as scheduled.   6. Diabetic polyneuropathy associated with type 2 diabetes mellitus (HCC) - gabapentin (NEURONTIN) 300 MG capsule; Take 2 cap in the morning, 1 cap in the evening and 2 cap at night.  Dispense: 180 capsule; Refill: 3  General Counseling: [place] verbalizes understanding of the findings of todays visit and agrees with plan of treatment. I have discussed any further diagnostic evaluation that may be needed or ordered today. We also reviewed his medications today. he has been  encouraged to call the office with any questions or concerns that should arise related to todays visit.   Hypertension Counseling:   The following hypertensive lifestyle modification were  recommended and discussed:  1. Limiting alcohol intake to less than 1 oz/day of ethanol:(24 oz of beer or 8 oz of wine or 2 oz of 100-proof whiskey). 2. Take baby ASA 81 mg daily. 3. Importance of regular aerobic exercise and losing weight. 4. Reduce dietary saturated fat and cholesterol intake for overall cardiovascular health. 5. Maintaining adequate dietary potassium, calcium, and magnesium intake. 6. Regular monitoring of the blood pressure. 7. Reduce sodium intake to less than 100 mmol/day (less than 2.3 gm of sodium or less than 6 gm of sodium choride)    This patient was seen by [place] with Dr [name] as a part of collaborative care agreement   Orders Placed This Encounter  Procedures  . POCT HgB A1C    Meds ordered this encounter  Medications  . fentaNYL (DURAGESIC) 100 MCG/HR    Sig: Place 1 patch onto the skin every 3 (three) days.    Dispense:  10 patch    Refill:  0    Order Specific Question:   Supervising Provider    Answer:   [name] [[id number]]  . gabapentin (NEURONTIN) 300 MG capsule    Sig: Take 2 cap in the morning, 1 cap in the evening and 2 cap at night.    Dispense:  180 capsule    Refill:  3    Ok to fill this prescription early. Thanks.    Order Specific Question:   Supervising Provider    Answer:   [name] [[id number]]    Time spent: 63 Minutes      Dr [name] Internal medicine

## 2019-03-17 ENCOUNTER — Other Ambulatory Visit: Payer: Self-pay

## 2019-03-17 MED ORDER — FEBUXOSTAT 40 MG PO TABS: 40.0000 mg | ORAL_TABLET | Freq: Every day | ORAL | 3 refills | Status: DC

## 2019-03-18 ENCOUNTER — Other Ambulatory Visit
Admission: RE | Admit: 2019-03-18 | Discharge: 2019-03-18 | Disposition: A | Discharge status: Home / Self Care | Payer: Medicare Other | Source: Ambulatory Visit | Attending: Nurse Practitioner | Admitting: Nurse Practitioner

## 2019-03-18 ENCOUNTER — Ambulatory Visit
Admission: RE | Admit: 2019-03-18 | Discharge: 2019-03-18 | Disposition: A | Discharge status: Home / Self Care | Payer: Medicare Other | Source: Ambulatory Visit | Attending: Physician Assistant | Admitting: Physician Assistant

## 2019-03-18 ENCOUNTER — Other Ambulatory Visit: Payer: Self-pay | Admitting: Physician Assistant

## 2019-03-18 ENCOUNTER — Other Ambulatory Visit: Payer: Self-pay

## 2019-03-18 ENCOUNTER — Encounter: Payer: Medicare Other | Attending: Physician Assistant | Admitting: Physician Assistant

## 2019-03-18 DIAGNOSIS — E11622 Type 2 diabetes mellitus with other skin ulcer: Secondary | ICD-10-CM | POA: Insufficient documentation

## 2019-03-18 DIAGNOSIS — E11621 Type 2 diabetes mellitus with foot ulcer: Secondary | ICD-10-CM | POA: Insufficient documentation

## 2019-03-18 DIAGNOSIS — B999 Unspecified infectious disease: Secondary | ICD-10-CM

## 2019-03-18 DIAGNOSIS — E119 Type 2 diabetes mellitus without complications: Secondary | ICD-10-CM | POA: Diagnosis not present

## 2019-03-18 DIAGNOSIS — I872 Venous insufficiency (chronic) (peripheral): Secondary | ICD-10-CM | POA: Insufficient documentation

## 2019-03-18 DIAGNOSIS — E1140 Type 2 diabetes mellitus with diabetic neuropathy, unspecified: Secondary | ICD-10-CM | POA: Insufficient documentation

## 2019-03-18 DIAGNOSIS — I890 Lymphedema, not elsewhere classified: Secondary | ICD-10-CM | POA: Diagnosis not present

## 2019-03-18 DIAGNOSIS — L97822 Non-pressure chronic ulcer of other part of left lower leg with fat layer exposed: Secondary | ICD-10-CM | POA: Insufficient documentation

## 2019-03-18 DIAGNOSIS — E6609 Other obesity due to excess calories: Secondary | ICD-10-CM | POA: Insufficient documentation

## 2019-03-18 DIAGNOSIS — L97512 Non-pressure chronic ulcer of other part of right foot with fat layer exposed: Secondary | ICD-10-CM | POA: Diagnosis not present

## 2019-03-18 DIAGNOSIS — L97929 Non-pressure chronic ulcer of unspecified part of left lower leg with unspecified severity: Secondary | ICD-10-CM | POA: Diagnosis present

## 2019-03-18 DIAGNOSIS — Z6841 Body Mass Index (BMI) 40.0 and over, adult: Secondary | ICD-10-CM | POA: Diagnosis not present

## 2019-03-18 LAB — LIPID PANEL
Cholesterol: 119 mg/dL (ref 0–200)
HDL: 21 mg/dL — ABNORMAL LOW (ref >40)
LDL Cholesterol: 61 mg/dL (ref 0–99)
Total CHOL/HDL Ratio: 5.7 RATIO
Triglycerides: 183 mg/dL — ABNORMAL HIGH (ref <150)
VLDL: 37 mg/dL (ref 0–40)

## 2019-03-18 LAB — COMPREHENSIVE METABOLIC PANEL
ALT: 14 U/L (ref 0–44)
AST: 18 U/L (ref 15–41)
Albumin: 3.6 g/dL (ref 3.5–5.0)
Alkaline Phosphatase: 77 U/L (ref 38–126)
Anion gap: 13 (ref 5–15)
BUN: 34 mg/dL — ABNORMAL HIGH (ref 8–23)
CO2: 25 mmol/L (ref 22–32)
Calcium: 8.8 mg/dL — ABNORMAL LOW (ref 8.9–10.3)
Chloride: 99 mmol/L (ref 98–111)
Creatinine, Ser: 2.76 mg/dL — ABNORMAL HIGH (ref 0.61–1.24)
GFR calc Af Amer: 25 mL/min — ABNORMAL LOW (ref >60)
GFR calc non Af Amer: 22 mL/min — ABNORMAL LOW (ref >60)
Glucose, Bld: 123 mg/dL — ABNORMAL HIGH (ref 70–99)
Potassium: 3.3 mmol/L — ABNORMAL LOW (ref 3.5–5.1)
Sodium: 137 mmol/L (ref 135–145)
Total Bilirubin: 0.5 mg/dL (ref 0.3–1.2)
Total Protein: 7 g/dL (ref 6.5–8.1)

## 2019-03-18 LAB — T4, FREE: Free T4: 0.99 ng/dL (ref 0.61–1.12)

## 2019-03-18 LAB — TSH: TSH: 2.625 u[IU]/mL (ref 0.350–4.500)

## 2019-03-18 LAB — CBC
HCT: 36.3 % — ABNORMAL LOW (ref 39.0–52.0)
Hemoglobin: 12.4 g/dL — ABNORMAL LOW (ref 13.0–17.0)
MCH: 32.2 pg (ref 26.0–34.0)
MCHC: 34.2 g/dL (ref 30.0–36.0)
MCV: 94.3 fL (ref 80.0–100.0)
Platelets: 156 10*3/uL (ref 150–400)
RBC: 3.85 MIL/uL — ABNORMAL LOW (ref 4.22–5.81)
RDW: 12.3 % (ref 11.5–15.5)
WBC: 7.3 10*3/uL (ref 4.0–10.5)
nRBC: 0 % (ref 0.0–0.2)

## 2019-03-18 LAB — VITAMIN B12: Vitamin B-12: 455 pg/mL (ref 180–914)

## 2019-03-18 LAB — FERRITIN: Ferritin: 74 ng/mL (ref 24–336)

## 2019-03-18 LAB — FOLATE: Folate: 17.1 ng/mL (ref >5.9)

## 2019-03-18 LAB — PSA: Prostatic Specific Antigen: 0.15 ng/mL (ref 0.00–4.00)

## 2019-03-19 ENCOUNTER — Other Ambulatory Visit
Admission: RE | Admit: 2019-03-19 | Discharge: 2019-03-19 | Disposition: A | Discharge status: Home / Self Care | Payer: Medicare Other | Source: Ambulatory Visit | Attending: Physician Assistant | Admitting: Physician Assistant

## 2019-03-19 DIAGNOSIS — B999 Unspecified infectious disease: Secondary | ICD-10-CM | POA: Diagnosis present

## 2019-03-19 DIAGNOSIS — D619 Aplastic anemia, unspecified: Secondary | ICD-10-CM | POA: Insufficient documentation

## 2019-03-19 DIAGNOSIS — E1165 Type 2 diabetes mellitus with hyperglycemia: Secondary | ICD-10-CM | POA: Insufficient documentation

## 2019-03-19 NOTE — Progress Notes (Signed)
Adam, Merritt ([id number]) Visit Report for 03/18/2019 Allergy List Details Patient Name: Adam Merritt, Adam Merritt Date of Service: 03/18/2019 1:15 PM Medical Record Number: [id number] Patient Account Number: [id number] Date of Birth/Sex: September 11, 1945 (73 y.o. M) Treating RN: Harold Barban Primary Care Casen Pryor: [name] Other Clinician: Referring Daemon Dowty: [name] Treating Kvon Mcilhenny/Extender: STONE III, [name]: 0 Allergies Active Allergies Corticosteroids (Glucocorticoids) Allergy Notes Electronic Signature(s) Signed: 03/19/2019 4:37:37 PM By: Harold Barban Entered By: Harold Barban on 03/18/2019 13:13:48 Adam Merritt ([id number]) -------------------------------------------------------------------------------- [place] Information Details Patient Name: Adam Merritt Date of Service: 03/18/2019 1:15 PM Medical Record Number: [id number] Patient Account Number: [id number] Date of Birth/Sex: 1946/04/08 (73 y.o. M) Treating RN: Harold Barban Primary Care Bayne Fosnaugh: [name] Other Clinician: Referring Giovannina Mun: [name] Treating Berneta Sconyers/Extender: Melburn Hake, [name]: 0 Visit Information Patient Arrived: Cane Arrival Time: 13:05 Accompanied By: wife Transfer Assistance: None Patient Identification Verified: Yes Secondary Verification Process Yes Completed: Patient Has Alerts: Yes Patient Alerts: Patient on Blood Thinner DMII aspirin 325 ABI 10/22/17 L 1.15 R .98 TBI L .66 R .61 History Since Last Visit Added or deleted any medications: No Any new allergies or adverse reactions: No Had a fall or experienced change in activities of daily living that may affect risk of falls: No Signs or symptoms of abuse/neglect since last visito No Hospitalized since last visit: No Has Dressing in Place as Prescribed: Yes Has Compression in Place as Prescribed: Yes Electronic Signature(s) Signed: 03/18/2019 5:32:28 PM By: Montey Hora Entered  By: Montey Hora on 03/18/2019 13:37:14 Adam Merritt ([id number]) -------------------------------------------------------------------------------- Clinic Level of Care Assessment Details Patient Name: Adam Merritt Date of Service: 03/18/2019 1:15 PM Medical Record Number: [id number] Patient Account Number: [id number] Date of Birth/Sex: [birth year] (73 y.o. M) Treating RN: Montey Hora Primary Care Mahayla Haddaway: [name] Other Clinician: Referring Kal Chait: [name] Treating Rivkah Wolz/Extender: Melburn Hake, [name]: 0 Clinic Level of Care Assessment Items TOOL 1 Quantity Score []  - Use when EandM and Procedure is performed on INITIAL visit 0 ASSESSMENTS - Nursing Assessment / Reassessment X - General Physical Exam (combine w/ comprehensive assessment (listed just below) when 1 20 performed on new pt. evals) X- 1 25 Comprehensive Assessment (HX, ROS, Risk Assessments, Wounds Hx, etc.) ASSESSMENTS - Wound and Skin Assessment / Reassessment []  - Dermatologic / Skin Assessment (not related to wound area) 0 ASSESSMENTS - Ostomy and/or Continence Assessment and Care []  - Incontinence Assessment and Management 0 []  - 0 Ostomy Care Assessment and Management (repouching, etc.) PROCESS - Coordination of Care X - Simple Patient / Family Education for ongoing care 1 15 []  - 0 Complex (extensive) Patient / Family Education for ongoing care X- 1 10 Staff obtains Programmer, systems, Records, Test Results / Process Orders []  - 0 Staff telephones HHA, Nursing Homes / Clarify orders / etc []  - 0 Routine Transfer to another Facility (non-emergent condition) []  - 0 Routine Hospital Admission (non-emergent condition) X- 1 15 New Admissions / Biomedical engineer / Ordering NPWT, Apligraf, etc. []  - 0 Emergency Hospital Admission (emergent condition) PROCESS - Special Needs []  - Pediatric / Minor Patient Management 0 []  - 0 Isolation Patient Management []  - 0 Hearing /  Language / Visual special needs []  - 0 Assessment of Community assistance (transportation, D/C planning, etc.) []  - 0 Additional assistance / Altered mentation []  - 0 Support Surface(s) Assessment (bed, cushion, seat, etc.) JOSEANGEL, NETTLETON ([id number]) INTERVENTIONS - Miscellaneous []  - External ear exam 0 []  -  0 Patient Transfer (multiple staff / Civil Service fast streamer / Similar devices) []  - 0 Simple Staple / Suture removal (25 or less) []  - 0 Complex Staple / Suture removal (26 or more) []  - 0 Hypo/Hyperglycemic Management (do not check if billed separately) []  - 0 Ankle / Brachial Index (ABI) - do not check if billed separately Has the patient been seen at the hospital within the last three years: Yes Total Score: 85 Level Of Care: New/Established - Level 3 Electronic Signature(s) Signed: 03/18/2019 5:32:28 PM By: Montey Hora Entered By: Montey Hora on 03/18/2019 14:44:20 Adam Merritt ([id number]) -------------------------------------------------------------------------------- Compression Therapy Details Patient Name: Adam Merritt Date of Service: 03/18/2019 1:15 PM Medical Record Number: [id number] Patient Account Number: [id number] Date of Birth/Sex: 05/19/1946 (73 y.o. M) Treating RN: Montey Hora Primary Care Khristopher Kapaun: [name] Other Clinician: Referring Jonisha Kindig: [name] Treating Antario Yasuda/Extender: Melburn Hake, [name]: 0 Compression Therapy Performed for Wound Assessment: Wound #25 Left,Anterior Lower Leg Performed By: Clinician Montey Hora, RN Compression Type: Four Layer Pre Treatment ABI: 1.2 Post Procedure Diagnosis Same as Pre-procedure Electronic Signature(s) Signed: 03/18/2019 5:32:28 PM By: Montey Hora Entered By: Montey Hora on 03/18/2019 14:44:04 Adam Merritt ([id number]) -------------------------------------------------------------------------------- Encounter Discharge Information Details Patient Name: Adam Merritt Date of Service: 03/18/2019 1:15 PM Medical Record Number: [id number] Patient Account Number: [id number] Date of Birth/Sex: 03/09/1946 (73 y.o. M) Treating RN: Montey Hora Primary Care Reilly Molchan: [name] Other Clinician: Referring Moxon Messler: [name] Treating Shery Wauneka/Extender: Melburn Hake, [name]: 0 Encounter Discharge Information Items Discharge Condition: Stable Ambulatory Status: Cane Discharge Destination: Home Transportation: Private Auto Accompanied By: spouse Schedule Follow-up Appointment: Yes Clinical Summary of Care: Electronic Signature(s) Signed: 03/18/2019 5:32:28 PM By: Montey Hora Entered By: Montey Hora on 03/18/2019 14:45:39 Adam Merritt ([id number]) -------------------------------------------------------------------------------- Lower Extremity Assessment Details Patient Name: Adam Merritt Date of Service: 03/18/2019 1:15 PM Medical Record Number: [id number] Patient Account Number: [id number] Date of Birth/Sex: [birth year] (73 y.o. M) Treating RN: Harold Barban Primary Care Khayla Koppenhaver: [name] Other Clinician: Referring Claudy Abdallah: [name] Treating Ranetta Armacost/Extender: Melburn Hake, [name]: 0 Edema Assessment Assessed: [Left: No] [Right: No] [Left: Edema] [Right: :] Calf Left: Right: Point of Measurement: 34 cm From Medial Instep 52.9 cm 55.5 cm Ankle Left: Right: Point of Measurement: 12 cm From Medial Instep 36 cm 30.5 cm Vascular Assessment Pulses: Dorsalis Pedis Palpable: [Left:Yes] [Right:Yes] Posterior Tibial Palpable: [Left:No] [Right:No] Electronic Signature(s) Signed: 03/19/2019 4:37:37 PM By: Harold Barban Entered By: Harold Barban on 03/18/2019 13:20:33 Adam Merritt ([id number]) -------------------------------------------------------------------------------- Multi Wound Chart Details Patient Name: Adam Merritt Date of Service: 03/18/2019 1:15 PM Medical Record Number:  [id number] Patient Account Number: [id number] Date of Birth/Sex: 12-04-1945 (73 y.o. 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 Comorbid History: Anemia, Lymphedema, Anemia, Lymphedema, N/A Chronic Obstructive Chronic Obstructive Pulmonary Disease (COPD), Pulmonary Disease (COPD), Sleep Apnea, Congestive Sleep Apnea, Congestive Heart Failure, Coronary Artery Heart Failure, Coronary Artery Disease, Hypertension, Disease, Hypertension, Peripheral Venous Disease, Peripheral Venous Disease, Type II Diabetes, Type II  Diabetes, Osteoarthritis, Neuropathy, Osteoarthritis, Neuropathy, Received Chemotherapy Received Chemotherapy Date Acquired: 03/08/2019 03/08/2019 N/A Weeks of Treatment: 0 0 N/A Wound Status: Open Open N/A Measurements L x W x D 1.1x0.9x0.1 1.8x7.5x0.1 N/A (cm) Area (cm) : 0.778 10.603 N/A Volume (cm) : 0.078 1.06 N/A % Reduction in Area: 0.00% N/A N/A % Reduction in Volume: 0.00% N/A N/A Classification: Grade 2 Grade 2 N/A Exudate Amount: Small Large N/A Exudate Type: Serous Purulent N/A Exudate Color: amber yellow, brown, green N/A Wound Margin: Flat and Intact Flat and Intact N/A Granulation Amount: Large (67-100%) None Present (0%) N/A LEONA, PRESSLY ([id number]) Granulation Quality: Red N/A N/A Necrotic Amount: Small (1-33%) Large (67-100%) N/A Exposed Structures: Fat Layer (Subcutaneous Fat Layer  (Subcutaneous N/A Tissue) Exposed: Yes Tissue) Exposed: Yes Fascia: No Fascia: No Tendon: No Tendon: No Muscle: No Muscle: No Joint: No Joint: No Bone: No Bone: No Epithelialization: Large (67-100%) None N/A Treatment Notes Electronic Signature(s) Signed: 03/18/2019 5:32:28 PM By: Montey Hora Entered By: Montey Hora on 03/18/2019 13:42:12 Adam Merritt ([id number]) -------------------------------------------------------------------------------- [place] Details Patient Name: Adam Merritt Date of Service: 03/18/2019 1:15 PM Medical Record Number: [id number] Patient Account Number: [id number] Date of Birth/Sex: 08-Jun-1945 (73 y.o. M) Treating RN: Montey Hora Primary Care Loree Shehata: [name] Other Clinician: Referring Numan Zylstra: [name] Treating Calib Wadhwa/Extender: Melburn Hake, [name]: 0 Active Inactive Abuse / Safety / Falls / Self Care Management Nursing Diagnoses: History of Falls Goals: Patient will remain injury free related to falls Date Initiated: 03/18/2019 Target Resolution Date: 06/14/2019 Goal Status: Active Interventions: Assess fall risk on admission and as needed Notes: Orientation to the Wound Care Program Nursing Diagnoses: Knowledge deficit related to the wound healing center program Goals: Patient/caregiver will verbalize understanding of the [facility] Program Date Initiated: 03/18/2019 Target Resolution Date: 06/14/2019 Goal Status: Active Interventions: Provide education on orientation to the wound center Notes: Pressure Nursing Diagnoses: Knowledge deficit related to management of pressures ulcers Goals: Patient will remain free of pressure ulcers Date Initiated: 03/18/2019 Target Resolution Date: 06/14/2019 Goal Status: Active Interventions: Assess: immobility, friction, shearing, incontinence upon admission and as needed ARMONTE, TORTORELLA ([id number]) Notes: Venous Leg  Ulcer Nursing Diagnoses: Actual venous Insuffiency (use after diagnosis is confirmed) Goals: Patient will maintain optimal edema control Date Initiated: 03/18/2019 Target Resolution Date: 06/14/2019 Goal Status: Active Interventions: Assess peripheral edema status every visit. Compression as ordered Notes: Wound/Skin Impairment Nursing Diagnoses: Impaired tissue integrity Goals: Ulcer/skin breakdown will heal within 14 weeks Date Initiated: 03/18/2019 Target Resolution Date: 06/14/2019 Goal Status: Active Interventions: Assess patient/caregiver ability to obtain necessary supplies Assess patient/caregiver ability to perform ulcer/skin care regimen upon admission and as needed Assess ulceration(s) every visit Notes: Electronic Signature(s) Signed: 03/18/2019 5:32:28 PM By: Montey Hora Entered By: Montey Hora on 03/18/2019 13:41:47 Adam Merritt ([id number]) -------------------------------------------------------------------------------- Pain Assessment Details Patient Name: Adam Merritt Date of Service: 03/18/2019 1:15 PM Medical Record Number: [id number] Patient Account Number: [id number] Date of Birth/Sex: [birth year] (73 y.o. M) Treating RN: Harold Barban Primary Care Yukie Bergeron: [name] Other Clinician: Referring Aima Mcwhirt: [name] Treating Zalmen Wrightsman/Extender: Melburn Hake, [name]: 0 Active Problems Location of Pain Severity and Description of Pain Patient Has Paino No Site Locations Pain Management and Medication Current Pain Management: Electronic Signature(s) Signed: 03/19/2019 4:37:37 PM By: Harold Barban Entered By: Harold Barban on 03/18/2019 13:06:34 Adam Merritt ([id number]) -------------------------------------------------------------------------------- Patient/Caregiver Education Details Patient Name: Adam Merritt Date of Service: 03/18/2019 1:15 PM Medical Record  Number: [id number] Patient Account Number:  [id number] Date of Birth/Gender: November 14, 1945 (73 y.o. M) Treating RN: Montey Hora Primary Care Physician: [name] Other Clinician: Referring Physician: [name] Treating Physician/Extender: Sharalyn Ink in Treatment: 0 Education Assessment Education Provided To: Patient and Caregiver Education Topics Provided Offloading: Handouts: Other: offloading toe Methods: Demonstration, Explain/Verbal Responses: State content correctly Venous: Handouts: Other: edema management Methods: Explain/Verbal Responses: State content correctly Electronic Signature(s) Signed: 03/18/2019 5:32:28 PM By: Montey Hora Entered By: Montey Hora on 03/18/2019 14:47:13 Adam Merritt ([id number]) -------------------------------------------------------------------------------- Wound Assessment Details Patient Name: Adam Merritt Date of Service: 03/18/2019 1:15 PM Medical Record Number: [id number] Patient Account Number: [id number] Date of Birth/Sex: May 22, 1946 (73 y.o. M) Treating RN: Harold Barban Primary Care Welford Christmas: [name] Other Clinician: Referring Demarian Epps: [name] Treating Dinh Ayotte/Extender: STONE III, [name]: 0 Wound Status Wound Number: 24 Primary Diabetic Wound/Ulcer of the Lower Extremity Etiology: Wound Location: Right Toe Second - Distal Wound Open Wounding Event: Gradually Appeared Status: Date Acquired: 03/08/2019 Comorbid Anemia, Lymphedema, Chronic Obstructive Weeks Of Treatment: 0 History: Pulmonary Disease (COPD), Sleep Apnea, Clustered Wound: No Congestive Heart Failure, Coronary Artery Disease, Hypertension, Peripheral Venous Disease, Type II Diabetes, Osteoarthritis, Neuropathy, Received Chemotherapy Photos Wound Measurements Length: (cm) 1.1 % Reduction i Width: (cm) 0.9 % Reduction i Depth: (cm) 0.1 Epithelializa Area: (cm) 0.778 Tunneling: Volume: (cm) 0.078 Undermining: n Area: 0% n Volume: 0% tion: Large  (67-100%) No No Wound Description Classification: Grade 2 Foul Odor Aft Wound Margin: Flat and Intact Slough/Fibrin Exudate Amount: Small Exudate Type: Serous Exudate Color: amber er Cleansing: No o Yes Wound Bed Granulation Amount: Large (67-100%) Exposed Structure Granulation Quality: Red Fascia Exposed: No Necrotic Amount: Small (1-33%) Fat Layer (Subcutaneous Tissue) Exposed: Yes Necrotic Quality: Adherent Slough Tendon Exposed: No Muscle Exposed: No Joint Exposed: No Bone Exposed: No CASTULO, SCARPELLI ([id number]) Treatment Notes Wound #24 (Right, Distal Toe Second) Notes toe - silvercel and conform; leg - silvercel, xtrasorb, abd and 4 layer wrap with unna to anchor Electronic Signature(s) Signed: 03/19/2019 4:37:37 PM By: Harold Barban Entered By: Harold Barban on 03/18/2019 13:21:32 Adam Merritt ([id number]) -------------------------------------------------------------------------------- Wound Assessment Details Patient Name: Adam Merritt Date of Service: 03/18/2019 1:15 PM Medical Record Number: [id number] Patient Account Number: [id number] Date of Birth/Sex: [birth year] (73 y.o. M) Treating RN: Harold Barban Primary Care Katiejo Gilroy: [name] Other Clinician: Referring Hannelore Bova: [name] Treating Darnelle Derrick/Extender: STONE III, [name]: 0 Wound Status Wound Number: 25 Primary Diabetic Wound/Ulcer of the Lower Extremity Etiology: Wound Location: Left Lower Leg - Anterior Wound Open Wounding Event: Gradually Appeared Status: Date Acquired: 03/08/2019 Comorbid Anemia, Lymphedema, Chronic Obstructive Weeks Of Treatment: 0 History: Pulmonary Disease (COPD), Sleep Apnea, Clustered Wound: No Congestive Heart Failure, Coronary Artery Disease, Hypertension, Peripheral Venous Disease, Type II Diabetes, Osteoarthritis, Neuropathy, Received Chemotherapy Photos Wound Measurements Length: (cm) 1.8 % Reduction Width: (cm) 7.5 %  Reduction Depth: (cm) 0.1 Epithelializ Area: (cm) 10.603 Tunneling: Volume: (cm) 1.06 Undermining in Area: in Volume: ation: None No : No Wound Description Classification: Grade 2 Wound Margin: Flat and Intact Exudate Amount: Large Exudate Type: Purulent Exudate Color: yellow, brown, green Wound Bed Granulation Amount: None Present (0%) Exposed Structure Necrotic Amount: Large (67-100%) Fascia Exposed: No Necrotic Quality: Adherent Slough Fat Layer (Subcutaneous Tissue) Exposed: Yes Tendon Exposed: No Muscle Exposed: No Joint Exposed: No Bone Exposed: No JUSTIS, CLOSSER ([id number]) Treatment Notes Wound #25 (Left, Anterior Lower Leg) Notes toe - silvercel and conform;  leg - silvercel, xtrasorb, abd and 4 layer wrap with unna to anchor Electronic Signature(s) Signed: 03/19/2019 4:37:37 PM By: Harold Barban Entered By: Harold Barban on 03/18/2019 13:24:35 Adam Merritt ([id number]) -------------------------------------------------------------------------------- [place] Details Patient Name: Adam Merritt Date of Service: 03/18/2019 1:15 PM Medical Record Number: [id number] Patient Account Number: [id number] Date of Birth/Sex: [birth year] (73 y.o. M) Treating RN: Harold Barban Primary Care Britzy Graul: [name] Other Clinician: Referring Chanetta Moosman: [name] Treating Tomie Elko/Extender: Melburn Hake, [name]: 0 Vital Signs Time Taken: 13:05 Temperature (F): 97.7 Height (in): 66 Pulse (bpm): 72 Source: Stated Respiratory Rate (breaths/min): 18 Weight (lbs): 297 Blood Pressure (mmHg): 146/62 Source: Stated Reference Range: 80 - 120 mg / dl Body Mass Index (BMI): 47.9 Electronic Signature(s) Signed: 03/19/2019 4:37:37 PM By: Harold Barban Entered By: Harold Barban on 03/18/2019 13:09:35

## 2019-03-19 NOTE — Progress Notes (Signed)
REG, BIRCHER ([id number]) Visit Report for 03/18/2019 Abuse/Suicide Risk Screen Details Patient Name: Adam Merritt, Adam Merritt Date of Service: 03/18/2019 1:15 PM Medical Record Number: [id number] Patient Account Number: [id number] Date of Birth/Sex: 1946-01-07 (73 y.o. M) Treating RN: Harold Barban Primary Care Venice Marcucci: [name] Other Clinician: Referring Vinay Ertl: [name] Treating Tametha Banning/Extender: Melburn Hake, [name]: 0 Abuse/Suicide Risk Screen Items Answer ABUSE RISK SCREEN: Has anyone close to you tried to hurt or harm you recentlyo No Do you feel uncomfortable with anyone in your familyo No Has anyone forced you do things that you didnot want to doo No Electronic Signature(s) Signed: 03/19/2019 4:37:37 PM By: Harold Barban Entered By: Harold Barban on 03/18/2019 13:15:19 Adam Merritt ([id number]) -------------------------------------------------------------------------------- Activities of Daily Living Details Patient Name: Adam Merritt Date of Service: 03/18/2019 1:15 PM Medical Record Number: [id number] Patient Account Number: [id number] Date of Birth/Sex: 04/25/1946 (73 y.o. M) Treating RN: Harold Barban Primary Care Nyaja [place]: [name] Other Clinician: Referring Dilana Mcphie: [name] Treating Rhyleigh Grassel/Extender: Melburn Hake, [name]: 0 Activities of Daily Living Items Answer Activities of Daily Living (Please select one for each item) Drive Automobile Not Able Take Medications Completely Able Use Telephone Completely Able Care for Appearance Completely Able Use Toilet Completely Able Bath / Shower Completely Able Dress Self Completely Able Feed Self Completely Able Walk Need Assistance Get In / Out Bed Need Assistance Housework Not Able Prepare Meals Need Assistance Handle Money Completely Able Shop for Self Need Assistance Electronic Signature(s) Signed: 03/19/2019 4:37:37 PM By: Harold Barban Entered By:  Harold Barban on 03/18/2019 13:16:30 Adam Merritt ([id number]) -------------------------------------------------------------------------------- Education Screening Details Patient Name: Adam Merritt Date of Service: 03/18/2019 1:15 PM Medical Record Number: [id number] Patient Account Number: [id number] Date of Birth/Sex: 1945-09-12 (73 y.o. M) Treating RN: Harold Barban Primary Care Halina Asano: [name] Other Clinician: Referring Alashia Brownfield: [name] Treating  Williamsen/Extender: Melburn Hake, [name]: 0 Primary Learner Assessed: Patient Learning Preferences/Education Level/Primary Language Learning Preference: Explanation Highest Education Level: College or Above Preferred Language: English Cognitive Barrier Language Barrier: No Translator Needed: No Memory Deficit: No Emotional Barrier: No Cultural/Religious Beliefs Affecting Medical Care: No Physical Barrier Impaired Vision: No Impaired Hearing: No Decreased Hand dexterity: No Knowledge/Comprehension Knowledge Level: High Comprehension Level: High Ability to understand written High instructions: Ability to understand verbal High instructions: Motivation Anxiety Level: Calm Cooperation: Cooperative Education Importance: Acknowledges Need Interest in Health Problems: Asks Questions Perception: Coherent Willingness to Engage in Self- High Management Activities: Readiness to Engage in Self- High Management Activities: Electronic Signature(s) Signed: 03/19/2019 4:37:37 PM By: Harold Barban Entered By: Harold Barban on 03/18/2019 13:17:13 Adam Merritt ([id number]) -------------------------------------------------------------------------------- Fall Risk Assessment Details Patient Name: Adam Merritt Date of Service: 03/18/2019 1:15 PM Medical Record Number: [id number] Patient Account Number: [id number] Date of Birth/Sex: Feb 25, 1946 (73 y.o. M) Treating RN: Harold Barban Primary Care  Mahmoud Blazejewski: [name] Other Clinician: Referring Yaron Grasse: [name] Treating Evoleht Hovatter/Extender: Melburn Hake, [name]: 0 Fall Risk Assessment Items Have you had 2 or more falls in the last 12 monthso 0 Yes Have you had any fall that resulted in injury in the last 12 monthso 0 No FALLS RISK SCREEN History of falling - immediate or within 3 months 0 No Secondary diagnosis (Do you have 2 or more medical diagnoseso) 0 No Ambulatory aid None/bed rest/wheelchair/nurse 0 No Crutches/cane/walker 15 Yes Furniture 0 No Intravenous therapy Access/Saline/Heparin Lock 0 No Gait/Transferring Normal/ bed rest/ wheelchair 0 No Weak (  short steps with or without shuffle, stooped but able to lift head while 0 No walking, may seek support from furniture) Impaired (short steps with shuffle, may have difficulty arising from chair, head 20 Yes down, impaired balance) Mental Status Oriented to own ability 0 Yes Electronic Signature(s) Signed: 03/19/2019 4:37:37 PM By: Harold Barban Entered By: Harold Barban on 03/18/2019 13:18:05 Adam Merritt ([id number]) -------------------------------------------------------------------------------- Foot Assessment Details Patient Name: Adam Merritt Date of Service: 03/18/2019 1:15 PM Medical Record Number: [id number] Patient Account Number: [id number] Date of Birth/Sex: January 22, 1946 (73 y.o. M) Treating RN: Harold Barban Primary Care Shaley Leavens: [name] Other Clinician: Referring Tyrus Wilms: [name] Treating Halyn Flaugher/Extender: Melburn Hake, [name]: 0 Foot Assessment Items Site Locations + = Sensation present, - = Sensation absent, C = Callus, U = Ulcer R = Redness, W = Warmth, M = Maceration, PU = Pre-ulcerative lesion F = Fissure, S = Swelling, D = Dryness Assessment Right: Left: Other Deformity: No No Prior Foot Ulcer: No No Prior Amputation: No No Charcot Joint: No No Ambulatory Status: Ambulatory With  Help Assistance Device: Cane Gait: Buyer, retail Signature(s) Signed: 03/19/2019 4:37:37 PM By: Harold Barban Entered By: Harold Barban on 03/18/2019 13:19:20 Adam Merritt ([id number]) -------------------------------------------------------------------------------- Nutrition Risk Screening Details Patient Name: Adam Merritt Date of Service: 03/18/2019 1:15 PM Medical Record Number: [id number] Patient Account Number: [id number] Date of Birth/Sex: [birth year] (73 y.o. M) Treating RN: Harold Barban Primary Care Sharese Manrique: [name] Other Clinician: Referring Josselin Gaulin: [name] Treating Ayano Douthitt/Extender: Melburn Hake, [name]: 0 Height (in): 66 Weight (lbs): 297 Body Mass Index (BMI): 47.9 Nutrition Risk Screening Items Score Screening NUTRITION RISK SCREEN: I have an illness or condition that made me change the kind and/or amount of 0 No food I eat I eat fewer than two meals per day 0 No I eat few fruits and vegetables, or milk products 0 No I have three or more drinks of beer, liquor or wine almost every day 0 No I have tooth or mouth problems that make it hard for me to eat 0 No I don't always have enough money to buy the food I need 0 No I eat alone most of the time 0 No I take three or more different prescribed or over-the-counter drugs a day 1 Yes Without wanting to, I have lost or gained 10 pounds in the last six months 0 No I am not always physically able to shop, cook and/or feed myself 0 No Nutrition Protocols Good Risk Protocol Moderate Risk Protocol High Risk Proctocol Risk Level: Good Risk Score: 1 Electronic Signature(s) Signed: 03/19/2019 4:37:37 PM By: Harold Barban Entered By: Harold Barban on 03/18/2019 13:19:49

## 2019-03-19 NOTE — Progress Notes (Signed)
His renal functions are way up. Labs done 10/13. I would get renal ultrasound, but I think last time, we were unable to get him on table to have ultrasound evaluation. Do you have suggestion for what I can do? He has recently been [facility] for [facility] and PTSD. Could that be causing the sudden increase in renal functions?

## 2019-03-20 NOTE — Progress Notes (Addendum)
Adam Merritt ([id number]) Visit Report for 03/18/2019 Chief Complaint Document Details Patient Name: Adam Merritt Date of Service: 03/18/2019 1:15 PM Medical Record Number: [id number] Patient Account Number: [id number] Date of Birth/Sex: [birth year] (73 y.o. M) Treating RN: Adam Merritt Primary Care Provider: Clayborn Merritt Other Clinician: Referring Provider: Clayborn Merritt Treating Provider/Extender: Adam Merritt [name]: 0 Information Obtained from: Patient Chief Complaint Right toe and left LE Ulcer Electronic Signature(s) Signed: 03/18/2019 1:30:04 PM By: Adam Keeler PA-C Entered By: Adam Merritt on 03/18/2019 13:30:03 Adam Merritt ([id number]) -------------------------------------------------------------------------------- HPI Details Patient Name: Adam Merritt Date of Service: 03/18/2019 1:15 PM Medical Record Number: [id number] Patient Account Number: [id number] Date of Birth/Sex: Apr 18, 1946 (73 y.o. M) Treating RN: Adam Merritt Primary Care Provider: Clayborn Merritt Other Clinician: Referring Provider: Clayborn Merritt Treating Provider/Extender: Adam Merritt [name]: 0 History of Present Illness HPI Description: 10/18/17-He is here for initial evaluation of bilateral lower extremity ulcerations in the presence of venous insufficiency and lymphedema. He has been seen by vascular medicine in the past, Dr. Lucky Merritt, last seen in 2016. He does have a history of abnormal ABIs, which is to be expected given his lymphedema and venous insufficiency. According to Epic, it appears that all attempts for arterial evaluation and/or angiography were not follow through with by patient. He does have a history of being seen in lymphedema clinic in 2018, stopped going approximately 6 months ago stating "it didn't do any good". He does not have lymphedema pumps, he does not have custom fit compression wrap/stockings. He is diabetic and his recent A1c last month was  7.6. He admits to chronic bilateral lower extremity pain, no change in pain since blister and ulceration development. He is currently being treated with Levaquin for bronchitis. He has home health and we will continue. 10/25/17-He is here in follow-up evaluation for bilateral lower extremity ulcerationssubtle he remains on Levaquin for bronchitis. Right lower extremity with no evidence of drainage or ulceration, persistent left lower extremity ulceration. He states that home health has not been out since his appointment. He went to [facility] on Tuesday, studies revealed: RIGHT ABI 0.9, TBI 0.6 LEFT ABI 1.1, TBI 0.6 with triphasic flow bilaterally. We will continue his same treatment plan. He has been educated on compression therapy and need for elevation. He will benefit from lymphedema pumps 11/01/17-He is here in follow-up evaluation for left lower extremity ulcer. The right lower extremity remains healed. He has home health services, but they have not been out to see the patient for 2-3 weeks. He states it home health physical therapy changed his dressing yesterday after therapy; he placed Ace wrap compression. We are still waiting for lymphedema pumps, reordered d/t need for company change. 11/08/17-He is here in follow-up evaluation for left lower extremity ulcer. It is improved. Edema is significantly improved with compression therapy. We will continue with same treatment plan and he will follow-up next week. No word regarding lymphedema pumps 11/15/17-He is here in follow-up evaluation for left lower extremity ulcer. He is healed and will be discharged from wound care services. I have reached out to [facility] regarding his lymphedema pumps. They have been unable to reach the patient; the contact number they had with the patient's wife's cell phone and she has not answered any unrecognized calls. Contact should be made today, trial planned for next week; [facility]  will continue to follow 11/27/17 on evaluation today patient has multiple blistered areas over  the right lower extremity his left lower extremity appears to be doing okay. These blistered areas show signs of no infection which is great news. With that being said he did have some necrotic skin overlying which was mechanically debrided away with saline and gauze today without complication. Overall post debridement the wounds appear to be doing better but in general his swelling seems to be increased. This is obviously not good news. I think this is what has given rise to the blisters. 12/04/17 on evaluation today patient presents for follow-up concerning his bilateral lower extremity edema in the right lower extremity ulcers. He has been tolerating the dressing changes without complication. With that being said he has had no real issues with the wraps which is also good news. Overall I'm pleased with the progress he's been making. 12/11/17 on evaluation today patient appears to be doing rather well in regard to his right lateral lower extremity ulcer. He's been tolerating the dressing changes without complication. Fortunately there does not appear to be any evidence of infection at this time. Overall I'm pleased with the progress that is being made. Unfortunately he has been in the hospital due to having what sounds to be a stomach virus/flu fortunately that is starting to get better. 12/18/17 on evaluation today patient actually appears to be doing very well in regard to his bilateral lower extremities the swelling is under fairly good control his lymphedema pumps are still not up and running quite yet. With that being said he does have several areas of opening noted as far as wounds are concerned mainly over the left lower extremity. With that being said I do believe once he gets lymphedema pumps this would least help you mention some the fluid and preventing this from occurring. Hopefully that will be  set up soon sleeves are Adam Merritt in place at his home he just waiting for the machine. 12/25/17 on evaluation today patient actually appears to be doing excellent in fact all of his ulcers appear to have resolved his Adam Merritt, Adam Merritt. ([id number]) legs appear very well. I do think he needs compression stockings we have discussed this and they are actually going to go to [place] today to elastic therapy to get this fitted for him. I think that is definitely a good thing to do. Readmission: 04/09/18 upon evaluation today this patient is seen for readmission due to bilateral lower extremity lymphedema. He has significant swelling of his extremities especially on the left although the right is also swollen he has weeping from both sides. There are no obvious open wounds at this point. Fortunately he has been doing fairly well for quite a bit of time since I last saw him. Nonetheless unfortunately this seems to have reopened and is giving quite a bit of trouble. He states this began about a week ago when he first called [place] to get in to be seen. No fevers, chills, nausea, or vomiting noted at this time. He has not been using his lymphedema pumps due to the fact that they won't fit on his leg at this point likewise is also not been using his compression for essentially the same reason. 04/16/18 upon evaluation today patient actually appears to be doing a little better in regard to the fluid in his bilateral lower extremities. With that being said he's had three falls since I saw him last week. He also states that he's been feeling very poorly. I was concerned last week and feel like that the concern is still  there as far as the congestion in his chest is concerned he seems to be breathing about the same as last week but again he states he's very weak he's not even able to walk further than from the chair to the door. His wife had to buy a wheelchair just to be able to get them out of the house to get to  the appointment today. This has me very concerned. 04/23/18 on evaluation today patient actually appears to be doing much better than last week's evaluation. At that time actually had to transport him to the ER via EMS and he subsequently was admitted for acute pulmonary edema, acute renal failure, and acute congestive heart failure. Fortunately he is doing much better. Apparently they did dialyze him and were able to take off roughly 35 pounds of fluid. Nonetheless he is feeling much better both in regard to his breathing and he's able to get around much better at this time compared to previous. Overall I'm very happy with how things are at this time. There does not appear to be any evidence of infection currently. No fevers, chills, nausea, or vomiting noted at this time. 04/30/2018 patient seen today for follow-up and management of bilateral lower extremity lymphedema. He did express being more sad today than usual due to the recent loss of his dog. He states that he has been compliant with using the lymphedema pumps. However he does admit a minute over the last 2-3 days he has not been using the pumps due to the recent loss of his dog. At this time there is no drainage or open wounds to his lower extremities. The left leg edema is measuring smaller today. Still has a significant amount of edema on bilateral lower extremities With dry flaky skin. He will be referred to the lymphedema clinic for further management. Will continue 3 layer compression wraps and follow-up in 1-2 weeks.Denies any pain, fever, chairs recently. No recent falls or injuries reported during this visit. 05/07/18 on evaluation today patient actually appears to be doing very well in regard to his lower extremities in general all things considered. With that being said he is having some pain in the legs just due to the amount of swelling. He does have an area where he had a blister on the left lateral lower extremity this is  open at this point other than that there's nothing else weeping at this time. 05/14/18 on evaluation today patient actually appears to be doing excellent all things considered in regard to his lower extremities. He still has a couple areas of weeping on each leg which has continued to be the issue for him. He does have an appointment with the lymphedema clinic although this isn't until February 2020. That was the earliest they had. In the meantime he has continued to tolerate the compression wraps without complication. 05/28/18 on evaluation today patient actually appears to be doing more poorly in regard to his left lower extremity where he has a wound open at this point. He also had a fall where he subsequently injured his right great toe which has led to an open wound at the site unfortunately. He has been tolerating the dressing changes without complication in general as far as the wraps are concerned that he has not been putting any dressing on the left 1st toe ulcer site. 06/11/18 on evaluation today patient appears to be doing much worse in regard to his bilateral lower extremity ulcers. He has been tolerating the dressing changes  without complication although his legs have not been wrapped more recently. Overall I am not very pleased with the way his legs appear. I do believe he needs to be back in compression wraps he still has not received his compression wraps from the [facility] hospital as of yet. 06/18/18 on evaluation today patient actually appears to be doing significantly better than last time I saw him. He has been tolerating the compression wraps without complication in the circumferential ulcers especially appear to be doing much better. His toe ulcer on the right in regard to the great toe is better although not as good as the legs in my pinion. No fevers chills noted Adam Merritt, Adam Merritt ([id number]) 07/02/18 on evaluation today patient appears to be doing much better in regard to his lower  extremity ulcers. Unfortunately since I last saw him he's had the distal portion of his right great toe if he dated it sounds as if this actually went downhill very quickly. I had only seen him a few days prior and the toe did not appear to be infected at that point subsequently became infected very rapidly and it was decided by the surgeon that the distal portion of the toe needed to be removed. The patient seems to be doing well in this regard he tells me. With that being said his lower extremities are doing better from the standpoint of the wounds although he is significantly swollen at this point. 07/09/18 on evaluation today patient appears to be doing better in regard to the wounds on his lower extremities. In fact everything is almost completely healed he is just a small area on the left posterior lower extremity that is open at this point. 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 With that being said he needs some kind of compression it does sound like he may not be wearing his compression, that is the wraps, during the entire time between when he's here visit to visit. Apparently his wife took the current one off because it began to "fall apart". 07/16/18 on evaluation today patient appears to be extremely swollen especially in regard to his left lower extremity unfortunately. He also has a new skin tear over the left lower extremity and there's a smaller area on the right lower extremity as well. Unfortunately this seems to be due in part to blistering and fluid buildup in his leg. He did get the reduction wraps that were ordered by the [facility] for him to go to lymphedema clinic. With that being said his wounds on the legs have not healed to the point to where they would likely accept them as a patient  lymphedema clinic currently. We need to try to get this to heal. With that being said he's been taking his wraps off which is not doing him any favors at this point. In fact this is probably quite counterproductive compared to what needs to occur. We will likely need to increase to a four layer compression wrap and continue to also utilize elevation and he has to keep the wraps on not take them off as he's been doing currently hasn't had a wrap on since Saturday. 07/23/18 on evaluation today patient appears to be doing much better in regard to his bilateral lower extremities. In fact his left lower extremity which was the largest is  actually 15 cm smaller today compared to what it was last time he was here in our clinic. This is obviously good news after just one week. Nonetheless the differences he actually kept the wraps on during the entire week this time. That's not typical for him. I do believe he understands a little bit better now the severity of the situation and why it's important for him to keep these wraps on. 07/30/18 on evaluation today patient actually appears to be doing rather well in regard to his lower extremities. His legs are much smaller than they have been in the past and he actually has only one very small rudder superficial region remaining that is not closed on the left lateral/posterior lower extremity even this is almost completely close. I do believe likely next week he will be healed without any complications. I do think we need to continue the wraps however this seems to be beneficial for him. I also think it may be a good time for [place] to go ahead and see about getting the appointment with the lymphedema clinic which is supposed to be made for him in order to keep this moving along and hopefully get them into compression wraps that will in the end help him to remain healed. 08/06/18 on evaluation today patient actually appears to be doing very well in regard as bilateral  lower extremities. In fact his wounds appear to be completely healed at this time. 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 He is in the process of getting appointment with the lymphedema clinic we have made this referral were just waiting to hear back on the schedule time. We need to follow up on that today as well. 08/13/18 on evaluation today patient actually appears to be doing very well in regard to his bilateral lower extremities there are no open wounds at this point. We are gonna go ahead and see about ordering the Velcro compression wraps for him had a discussion with them about [place] doing it versus the [place] they feel like they can definitely afford going ahead and get the wraps themselves and they would prefer to try to avoid having to go to the lymphedema clinic if it all possible which I completely understand. As long as he has good compression I'm okay either way. 3/17//20 on evaluation today patient actually appears to be doing well in regard to his bilateral lower extremity ulcers. He has been tolerating the dressing changes without complication specifically the compression wraps. Overall is had no issues my fingers finding that I see at this point is that he is having trouble with constipation. He tells me he has not been able to go to the bathroom for about six days. He's taken to over-the-counter oral laxatives unfortunately this is not helping. He has not contacted his doctor. 08/27/18 on evaluation today patient appears to be doing fairly well in regard to his lower extremities at this point. There does not appear to be any new altars is swelling is very well controlled. We are still waiting for his Velcro compression wraps to arrive that should be sometime in the next week is Adam Merritt sent the check for these. 09/03/18 on evaluation today patient appears to be doing excellent in regard to his bilateral lower extremities which  he shows KATE, SWEETMAN. ([id number]) no signs of wound openings in regard to this point. He does have his Velcro compression wraps which did arrive in the mail since  I saw him last week. Overall he is doing excellent in my pinion. 09/13/18 on evaluation today patient appears to be doing very well currently in regard to the overall appearance of his bilateral lower extremities although he's a little bit more swollen than last time we saw him. At that point he been discharged without any open wounds. Nonetheless he has a small open wound on the posterior left lower extremity with some evidence of cellulitis noted as well. Fortunately I feel like he has made good progress overall with regard to his lower extremities from were things used to be. 09/20/18 on evaluation today patient actually appears to be doing much better. The erythematous lower extremity is improving wound itself which is still open appears to be doing much better as far as but appearance as well as pain is concerned overall very pleased in this regard. There's no signs of active infection at this time. 09/27/18 on evaluation today patient's wounds on the lower extremity actually appear to be doing fairly well at this time which is good news. There is no evidence of active infection currently and again is just as left lower extremity were there any wounds at this point anyway. I believe they may be completely healed but again I'm not 100% sure based on evaluation today. I think one more week of observation would likely be a good idea. 10/04/18 on evaluation today patient actually appears to be doing excellent in regard to the left lower extremity which actually appears to be completely healed as of today. Unfortunately he's been having issues with his right lower extremity have a new wound that has opened. Fortunately there's no evidence of active infection at this time which is good news. 10/10/18 upon evaluation today patient actually  appears to be doing a little bit worse with the new open area on his right posterior lower extremity. He's been tolerating the dressing changes without complication. Right now we been using his compression wraps although I think we may need to switch back to actually performing bilateral compression wraps are in the clinic. No fevers, chills, nausea, or vomiting noted at this time. 10/17/18 on evaluation today patient actually appears to be doing quite well in regard to his bilateral lower extremity ulcers. He has been tolerating the reps without complication although he would prefer not to rewrap his legs as of today. Fortunately there's no signs of active infection which is good news. No fevers, chills, nausea, or vomiting noted at this time. 10/24/18 on evaluation today patient appears to be doing very well in regard to his lower extremities. His right lower extremity is shown signs of healing and his left lower [place] though not healed appears to be improving which is excellent news. Overall very pleased with how things seem to be progressing at this time. The patient is likewise happy to hear this. His Velcro compression wraps however have not been put on properly will gonna show his wife how to do that properly today. 10/31/18 on evaluation today patient appears to be doing more poorly in regard to his left lower extremity in particular although both lower extremities actually are showing some signs of being worse than my previous evaluation. Unfortunately I'm just not sure that his compression stockings even with the use of the compression/lymphedema pumps seem to be controlling this well. Upon further questioning he tells me that he also is not able to lie flat in the bed due to his congestive heart failure and difficulty breathing. For that  reason he sleeps in his recliner. To make matters worse his recliner also cannot even hold his legs up so instead of even being somewhat elevated they  pretty much hang down to the floor. This is the way he sleeps each night which is definitely counterproductive to everything else that were attempting to do from the standpoint of controlling his fluid. Nonetheless I think that he potentially could benefit from a hospital bed although this would be something that his primary care provider would likely have to order since anything that is order on our side has to be directly related to wound care and again the hospital bed is not necessarily a direct relation to although I think it does contribute to his overall wound status on his lower extremities. 11/07/18 on evaluation today patient appears to be doing better in regard to his bilateral lower extremity. He's been tolerating the dressing changes without complication. We did in the interim since I last saw him switch to just using extras orbiting new alginate and that seems to have done much better for him. I'm very pleased with the overall progress that is made. 11/14/18 on evaluation today patient appears to be redoing rather well in regard to his left lower extremity ulcers which are the only ones remaining at this point. Fortunately there's no signs of active infection at this time which is good news. Overall been very pleased with how things seem to be progressing currently. No fevers, chills, nausea, or vomiting noted at this time. 11/20/17 on evaluation today patient actually appears to be doing quite well in regard to his lower extremities on the left on the right he has several blisters that showed up although there's some question about whether or not he has had his broker compression wraps on like he was supposed to or not. Fortunately there's no signs of active infection at this time which is good news. Unfortunately though he is doing well from the standpoint of the left leg the right leg is not doing as well again this Adam Merritt, Adam Merritt. ([id number]) is when we did not wrap last week. 11/28/18  on evaluation today patient appears to be doing well in regard to his bilateral lower extremities is left appears to be healed is right is not healed but is very close to being so. Overall very pleased with how things seem to be progressing. Patient is likewise happy that things are doing well. 12/09/18 on evaluation today patient actually appears to be completely healed which is excellent news. He actually seems to be doing well in regard to the swelling of the bilateral lower extremities which is also great news. Overall very pleased with how things seem to be progressing. Readmission: 03/18/2019 upon evaluation today patient presents for reevaluation concerning issues that he is having with his left lower extremity where he does have a wound noted upon inspection today. He also has a wound on the right second toe on the tip where it is apparently been rubbing in his shoe I did look at issues and you can actually see where this has been occurring as well. Fortunately there is no signs of infection with regard to the toe necessarily although it is very swollen compared to normal that does have me somewhat concerned about the possibility of further evaluation for infection/osteomyelitis. I would recommend an x-ray to start with. Otherwise he states that it is been 1-2 weeks that he has had the draining in regard to left lower extremity he does not  know how this happened he has been wearing his compression appropriately and I do see that as well today but nonetheless I do think that he needs to continue to be very cautious with regard to elevation as well. Electronic Signature(s) Signed: 03/18/2019 1:45:21 PM By: Adam Keeler PA-C Previous Signature: 03/18/2019 1:45:03 PM Version By: Adam Keeler PA-C Entered By: Adam Merritt on 03/18/2019 13:45:21 Adam Merritt ([id number]) -------------------------------------------------------------------------------- Physical Exam Details Patient  Name: Adam Merritt Date of Service: 03/18/2019 1:15 PM Medical Record Number: [id number] Patient Account Number: [id number] Date of Birth/Sex: 1945/09/13 (73 y.o. M) Treating RN: Adam Merritt Primary Care Provider: Clayborn Merritt Other Clinician: Referring Provider: Clayborn Merritt Treating Provider/Extender: STONE III, Merritt [name]: 0 Constitutional patient is hypertensive.. pulse regular and within target range for patient.Marland Kitchen respirations regular, non-labored and within target range for patient.Marland Kitchen temperature within target range for patient.. Chronically ill appearing but in no apparent acute distress. Ears, Nose, Mouth, and Throat no gross abnormality of ear auricles or external auditory canals. normal hearing noted during conversation. mucus membranes moist. Respiratory normal breathing without difficulty. clear to auscultation bilaterally. Cardiovascular regular rate and rhythm with normal S1, S2. 2+ dorsalis pedis/posterior tibialis pulses. 1+ pitting edema of the bilateral lower extremities. Gastrointestinal (GI) soft, non-tender, non-distended, +BS. no ventral hernia noted. Musculoskeletal Patient unable to walk without assistance of devices. no significant deformity or arthritic changes, no loss or range of motion, no clubbing. Psychiatric this patient is able to make decisions and demonstrates good insight into disease process. Alert and Oriented x 3. pleasant and cooperative. Notes Upon inspection today patient's wound bed currently on the left lower extremity showed signs of potential infection. In fact it appears that he may have an infection with Pseudomonas based on the drainage that we noted at this point. There was no need for sharp debridement at either wound location today. I do not see any obvious signs of infection in regard to the toe ulcer on the right second toe. Electronic Signature(s) Signed: 03/18/2019 1:46:09 PM By: Adam Keeler PA-C Entered By:  Adam Merritt on 03/18/2019 13:46:08 Adam Merritt ([id number]) -------------------------------------------------------------------------------- Physician Orders Details Patient Name: Adam Merritt Date of Service: 03/18/2019 1:15 PM Medical Record Number: [id number] Patient Account Number: [id number] Date of Birth/Sex: Oct 31, 1945 (73 y.o. 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 Wound #25 Left,Anterior Lower Leg o May shower with protection. - Please do not get your wraps wet Skin Barriers/Peri-Wound Care Wound #25 Left,Anterior Lower Leg o Moisturizing lotion Primary Wound Dressing Wound #24 Right,Distal Toe Second o Silver Alginate Wound #25 Left,Anterior Lower Leg o Gentamicin Sulfate Cream o Silver Alginate - over gentamicin cream Secondary Dressing Wound #24 Right,Distal Toe Second o Gauze and Kerlix/Conform Wound #25 Left,Anterior Lower Leg o XtraSorb Dressing Change Frequency Wound #24 Right,Distal Toe Second o  Change dressing every other day. Wound #25 Left,Anterior Lower Leg o Other: - Twice Weekly Adam Merritt, Adam Merritt ([id number]) Follow-up Appointments Wound #24 Right,Distal Toe Second o Return Appointment in 1 week. o  Nurse Visit as needed - Friday Wound #25 Left,Anterior Lower Leg o Return Appointment in 1 week. o Nurse Visit as needed - Friday Edema Control Wound #24 Right,Distal Toe Second o Patient to wear own Velcro compression garment. o Elevate legs to the level of the heart and pump ankles as often as possible o Compression Pump: Use compression pump on left lower extremity for 60 minutes, twice daily. Wound #25 Left,Anterior Lower Leg o 4-Layer Compression System - Left Lower Extremity. - anchor with unna o Compression Pump: Use compression pump on left lower extremity for 60 minutes, twice daily. o Compression Pump: Use compression pump on right lower extremity for 60 minutes, twice daily. Laboratory o Bacteria identified in Wound by Culture (MICRO) oooo LOINC Code: [id number] oooo Convenience Name: Wound culture routine Radiology o X-ray, foot Electronic Signature(s) Signed: 03/20/2019 5:17:59 PM By: Adam Merritt Signed: 03/20/2019 6:39:08 PM By: Adam Keeler PA-C Previous Signature: 03/18/2019 5:32:28 PM Version By: Adam Merritt Previous Signature: 03/19/2019 6:12:50 PM Version By: Adam Keeler PA-C Entered By: Adam Merritt on 03/20/2019 13:57:22 Adam Merritt ([id number]) -------------------------------------------------------------------------------- Problem Merritt Details Patient Name: Adam Merritt Date of Service: 03/18/2019 1:15 PM Medical Record Number: [id number] Patient Account Number: [id number] Date of Birth/Sex: Sep 10, 1945 (73 y.o. M) Treating RN: Adam Merritt Primary Care Provider: Clayborn Merritt Other Clinician: Referring Provider: Clayborn Merritt Treating Provider/Extender: Adam Merritt [name]: 0 Active Problems ICD-10 Evaluated Encounter Code Description Active Date Today Diagnosis I89.0 Lymphedema, not elsewhere classified 03/18/2019 No Yes E11.621 Type 2 diabetes mellitus with foot ulcer 03/18/2019 No Yes L97.512  Non-pressure chronic ulcer of other part of right foot with fat 03/18/2019 No Yes layer exposed L97.822 Non-pressure chronic ulcer of other part of left lower leg with 03/18/2019 No Yes fat layer exposed E66.09 Other obesity due to excess calories 03/18/2019 No Yes Inactive Problems Resolved Problems Electronic Signature(s) Signed: 03/18/2019 1:29:29 PM By: Adam Keeler PA-C Entered By: Adam Merritt on 03/18/2019 13:29:28 Adam Merritt ([id number]) -------------------------------------------------------------------------------- Progress Note Details Patient Name: Adam Merritt Date of Service: 03/18/2019 1:15 PM Medical Record Number: [id number] Patient Account Number: [id number] Date of Birth/Sex: [birth year] (73 y.o. M) Treating RN: Adam Merritt Primary Care Provider: Clayborn Merritt Other Clinician: Referring Provider: Clayborn Merritt Treating Provider/Extender: Adam Merritt [name]: 0 Subjective Chief Complaint Information obtained from Patient Right toe and left LE Ulcer History of Present Illness (HPI) 10/18/17-He is here for initial evaluation of bilateral lower extremity ulcerations in the presence of venous insufficiency and lymphedema. He has been seen by vascular medicine in the past, Dr. Lucky Merritt, last seen in 2016. He does have a history of abnormal ABIs, which is to be expected given his lymphedema and venous insufficiency. According to Epic, it appears that all attempts for arterial evaluation and/or angiography were not follow through with by patient. He does have a history of being seen in lymphedema clinic in 2018, stopped going approximately 6 months ago stating "it didn't do any good". He does not have lymphedema pumps, he does not have custom fit compression wrap/stockings. He is diabetic and his recent A1c last month was 7.6. He admits to chronic bilateral lower extremity pain, no change in pain since blister and ulceration development. He is currently  being treated with  Levaquin for bronchitis. He has home health and we will continue. 10/25/17-He is here in follow-up evaluation for bilateral lower extremity ulcerationssubtle he remains on Levaquin for bronchitis. Right lower extremity with no evidence of drainage or ulceration, persistent left lower extremity ulceration. He states that home health has not been out since his appointment. He went to [facility] on Tuesday, studies revealed: RIGHT ABI 0.9, TBI 0.6 LEFT ABI 1.1, TBI 0.6 with triphasic flow bilaterally. We will continue his same treatment plan. He has been educated on compression therapy and need for elevation. He will benefit from lymphedema pumps 11/01/17-He is here in follow-up evaluation for left lower extremity ulcer. The right lower extremity remains healed. He has home health services, but they have not been out to see the patient for 2-3 weeks. He states it home health physical therapy changed his dressing yesterday after therapy; he placed Ace wrap compression. We are still waiting for lymphedema pumps, reordered d/t need for company change. 11/08/17-He is here in follow-up evaluation for left lower extremity ulcer. It is improved. Edema is significantly improved with compression therapy. We will continue with same treatment plan and he will follow-up next week. No word regarding lymphedema pumps 11/15/17-He is here in follow-up evaluation for left lower extremity ulcer. He is healed and will be discharged from wound care services. I have reached out to [facility] regarding his lymphedema pumps. They have been unable to reach the patient; the contact number they had with the patient's wife's cell phone and she has not answered any unrecognized calls. Contact should be made today, trial planned for next week; [facility] will continue to follow 11/27/17 on evaluation today patient has multiple blistered areas over the right lower extremity his left lower  extremity appears to be doing okay. These blistered areas show signs of no infection which is great news. With that being said he did have some necrotic skin overlying which was mechanically debrided away with saline and gauze today without complication. Overall post debridement the wounds appear to be doing better but in general his swelling seems to be increased. This is obviously not good news. I think this is what has given rise to the blisters. 12/04/17 on evaluation today patient presents for follow-up concerning his bilateral lower extremity edema in the right lower extremity ulcers. He has been tolerating the dressing changes without complication. With that being said he has had no real issues with the wraps which is also good news. Overall I'm pleased with the progress he's been making. 12/11/17 on evaluation today patient appears to be doing rather well in regard to his right lateral lower extremity ulcer. He's been tolerating the dressing changes without complication. Fortunately there does not appear to be any evidence of infection at this time. Overall I'm pleased with the progress that is being made. Unfortunately he has been in the hospital due to having what sounds to be a stomach virus/flu fortunately that is starting to get better. 12/18/17 on evaluation today patient actually appears to be doing very well in regard to his bilateral lower extremities the KEATON, BEICHNER. ([id number]) swelling is under fairly good control his lymphedema pumps are still not up and running quite yet. With that being said he does have several areas of opening noted as far as wounds are concerned mainly over the left lower extremity. With that being said I do believe once he gets lymphedema pumps this would least help you mention some the fluid and preventing  this from occurring. Hopefully that will be set up soon sleeves are Adam Merritt in place at his home he just waiting for the machine. 12/25/17 on evaluation  today patient actually appears to be doing excellent in fact all of his ulcers appear to have resolved his legs appear very well. I do think he needs compression stockings we have discussed this and they are actually going to go to [facility] today to elastic therapy to get this fitted for him. I think that is definitely a good thing to do. Readmission: 04/09/18 upon evaluation today this patient is seen for readmission due to bilateral lower extremity lymphedema. He has significant swelling of his extremities especially on the left although the right is also swollen he has weeping from both sides. There are no obvious open wounds at this point. Fortunately he has been doing fairly well for quite a bit of time since I last saw him. Nonetheless unfortunately this seems to have reopened and is giving quite a bit of trouble. He states this began about a week ago when he first called [place] to get in to be seen. No fevers, chills, nausea, or vomiting noted at this time. He has not been using his lymphedema pumps due to the fact that they won't fit on his leg at this point likewise is also not been using his compression for essentially the same reason. 04/16/18 upon evaluation today patient actually appears to be doing a little better in regard to the fluid in his bilateral lower extremities. With that being said he's had three falls since I saw him last week. He also states that he's been feeling very poorly. I was concerned last week and feel like that the concern is still there as far as the congestion in his chest is concerned he seems to be breathing about the same as last week but again he states he's very weak he's not even able to walk further than from the chair to the door. His wife had to buy a wheelchair just to be able to get them out of the house to get to the appointment today. This has me very concerned. 04/23/18 on evaluation today patient actually appears to be doing much better than last  week's evaluation. At that time actually had to transport him to the ER via EMS and he subsequently was admitted for acute pulmonary edema, acute renal failure, and acute congestive heart failure. Fortunately he is doing much better. Apparently they did dialyze him and were able to take off roughly 35 pounds of fluid. Nonetheless he is feeling much better both in regard to his breathing and he's able to get around much better at this time compared to previous. Overall I'm very happy with how things are at this time. There does not appear to be any evidence of infection currently. No fevers, chills, nausea, or vomiting noted at this time. 04/30/2018 patient seen today for follow-up and management of bilateral lower extremity lymphedema. He did express being more sad today than usual due to the recent loss of his dog. He states that he has been compliant with using the lymphedema pumps. However he does admit a minute over the last 2-3 days he has not been using the pumps due to the recent loss of his dog. At this time there is no drainage or open wounds to his lower extremities. The left leg edema is measuring smaller today. Still has a significant amount of edema on bilateral lower extremities With dry flaky skin.  He will be referred to the lymphedema clinic for further management. Will continue 3 layer compression wraps and follow-up in 1-2 weeks.Denies any pain, fever, chairs recently. No recent falls or injuries reported during this visit. 05/07/18 on evaluation today patient actually appears to be doing very well in regard to his lower extremities in general all things considered. With that being said he is having some pain in the legs just due to the amount of swelling. He does have an area where he had a blister on the left lateral lower extremity this is open at this point other than that there's nothing else weeping at this time. 05/14/18 on evaluation today patient actually appears to be  doing excellent all things considered in regard to his lower extremities. He still has a couple areas of weeping on each leg which has continued to be the issue for him. He does have an appointment with the lymphedema clinic although this isn't until February 2020. That was the earliest they had. In the meantime he has continued to tolerate the compression wraps without complication. 05/28/18 on evaluation today patient actually appears to be doing more poorly in regard to his left lower extremity where he has a wound open at this point. He also had a fall where he subsequently injured his right great toe which has led to an open wound at the site unfortunately. He has been tolerating the dressing changes without complication in general as far as the wraps are concerned that he has not been putting any dressing on the left 1st toe ulcer site. 06/11/18 on evaluation today patient appears to be doing much worse in regard to his bilateral lower extremity ulcers. He has been tolerating the dressing changes without complication although his legs have not been wrapped more recently. Overall I am not very pleased with the way his legs appear. I do believe he needs to be back in compression wraps he still has not received his compression wraps from the [facility] hospital as of yet. Adam Merritt, Adam Merritt ([id number]) 06/18/18 on evaluation today patient actually appears to be doing significantly better than last time I saw him. He has been tolerating the compression wraps without complication in the circumferential ulcers especially appear to be doing much better. His toe ulcer on the right in regard to the great toe is better although not as good as the legs in my pinion. No fevers chills noted 07/02/18 on evaluation today patient appears to be doing much better in regard to his lower extremity ulcers. Unfortunately since I last saw him he's had the distal portion of his right great toe if he dated it sounds as if this  actually went downhill very quickly. I had only seen him a few days prior and the toe did not appear to be infected at that point subsequently became infected very rapidly and it was decided by the surgeon that the distal portion of the toe needed to be removed. The patient seems to be doing well in this regard he tells me. With that being said his lower extremities are doing better from the standpoint of the wounds although he is significantly swollen at this point. 07/09/18 on evaluation today patient appears to be doing better in regard to the wounds on his lower extremities. In fact everything is almost completely healed he is just a small area on the left posterior lower extremity that is open at this point. 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 With that being said he needs some kind of compression it does sound like he may not be wearing his compression, that is the wraps, during the entire time between when he's here visit to visit. Apparently his wife took the current one off because it began to "fall apart". 07/16/18 on evaluation today patient appears to be extremely swollen especially in regard to his left lower extremity unfortunately. He also has a new skin tear over the left lower extremity and there's a smaller area on the right lower extremity as well. Unfortunately this seems to be due in part to blistering and fluid buildup in his leg. He did get the reduction wraps that were ordered by the [facility] hospital for him to go to lymphedema clinic. With that being said his wounds on the legs have not healed to the point to where they would likely accept them as a patient lymphedema clinic currently. We need to try to get this to heal. With that being said he's been taking his wraps off which is not doing him  any favors at this point. In fact this is probably quite counterproductive compared to what needs to occur. We will likely need to increase to a four layer compression wrap and continue to also utilize elevation and he has to keep the wraps on not take them off as he's been doing currently hasn't had a wrap on since Saturday. 07/23/18 on evaluation today patient appears to be doing much better in regard to his bilateral lower extremities. In fact his left lower extremity which was the largest is actually 15 cm smaller today compared to what it was last time he was here in our clinic. This is obviously good news after just one week. Nonetheless the differences he actually kept the wraps on during the entire week this time. That's not typical for him. I do believe he understands a little bit better now the severity of the situation and why it's important for him to keep these wraps on. 07/30/18 on evaluation today patient actually appears to be doing rather well in regard to his lower extremities. His legs are much smaller than they have been in the past and he actually has only one very small rudder superficial region remaining that is not closed on the left lateral/posterior lower extremity even this is almost completely close. I do believe likely next week he will be healed without any complications. I do think we need to continue the wraps however this seems to be beneficial for him. I also think it may be a good time for [place] to go ahead and see about getting the appointment with the lymphedema clinic which is supposed to be made for him in order to keep this moving along and hopefully get them into compression wraps that will in the end help him to remain healed. 08/06/18 on evaluation today patient actually appears to be doing very well in regard as bilateral lower extremities. In fact his wounds appear to be completely healed at this time. He does have bilateral lymphedema which has been extremely  well controlled with the compression wraps. He is in the process of getting appointment with the lymphedema clinic we have made this referral were just waiting to hear back on the schedule time. We need to follow up on that today as well. 08/13/18 on evaluation today  patient actually appears to be doing very well in regard to his bilateral lower extremities there are no open wounds at this point. We are gonna go ahead and see about ordering the Velcro compression wraps for him had a discussion with them about [place] doing it versus the [place] they feel like they can definitely afford going ahead and get the wraps themselves and they would prefer to try to avoid having to go to the lymphedema clinic if it all possible which I completely understand. As long as he has good compression I'm okay either way. 3/17//20 on evaluation today patient actually appears to be doing well in regard to his bilateral lower extremity ulcers. He has been tolerating the dressing changes without complication specifically the compression wraps. Overall is had no issues my fingers finding that I see at this point is that he is having trouble with constipation. He tells me he has not been able to go to the bathroom for about six days. He's taken to over-the-counter oral laxatives unfortunately this is not helping. He has not contacted his doctor. Adam Merritt, Adam Merritt ([id number]) 08/27/18 on evaluation today patient appears to be doing fairly well in regard to his lower extremities at this point. There does not appear to be any new altars is swelling is very well controlled. We are still waiting for his Velcro compression wraps to arrive that should be sometime in the next week is Adam Merritt sent the check for these. 09/03/18 on evaluation today patient appears to be doing excellent in regard to his bilateral lower extremities which he shows no signs of wound openings in regard to this point. He does have his Velcro compression wraps which did  arrive in the mail since I saw him last week. Overall he is doing excellent in my pinion. 09/13/18 on evaluation today patient appears to be doing very well currently in regard to the overall appearance of his bilateral lower extremities although he's a little bit more swollen than last time we saw him. At that point he been discharged without any open wounds. Nonetheless he has a small open wound on the posterior left lower extremity with some evidence of cellulitis noted as well. Fortunately I feel like he has made good progress overall with regard to his lower extremities from were things used to be. 09/20/18 on evaluation today patient actually appears to be doing much better. The erythematous lower extremity is improving wound itself which is still open appears to be doing much better as far as but appearance as well as pain is concerned overall very pleased in this regard. There's no signs of active infection at this time. 09/27/18 on evaluation today patient's wounds on the lower extremity actually appear to be doing fairly well at this time which is good news. There is no evidence of active infection currently and again is just as left lower extremity were there any wounds at this point anyway. I believe they may be completely healed but again I'm not 100% sure based on evaluation today. I think one more week of observation would likely be a good idea. 10/04/18 on evaluation today patient actually appears to be doing excellent in regard to the left lower extremity which actually appears to be completely healed as of today. Unfortunately he's been having issues with his right lower extremity have a new wound that has opened. Fortunately there's no evidence of active infection at this time which is good news. 10/10/18 upon evaluation today patient actually appears to be  doing a little bit worse with the new open area on his right posterior lower extremity. He's been tolerating the dressing changes  without complication. Right now we been using his compression wraps although I think we may need to switch back to actually performing bilateral compression wraps are in the clinic. No fevers, chills, nausea, or vomiting noted at this time. 10/17/18 on evaluation today patient actually appears to be doing quite well in regard to his bilateral lower extremity ulcers. He has been tolerating the reps without complication although he would prefer not to rewrap his legs as of today. Fortunately there's no signs of active infection which is good news. No fevers, chills, nausea, or vomiting noted at this time. 10/24/18 on evaluation today patient appears to be doing very well in regard to his lower extremities. His right lower extremity is shown signs of healing and his left lower Trinity though not healed appears to be improving which is excellent news. Overall very pleased with how things seem to be progressing at this time. The patient is likewise happy to hear this. His Velcro compression wraps however have not been put on properly will gonna show his wife how to do that properly today. 10/31/18 on evaluation today patient appears to be doing more poorly in regard to his left lower extremity in particular although both lower extremities actually are showing some signs of being worse than my previous evaluation. Unfortunately I'm just not sure that his compression stockings even with the use of the compression/lymphedema pumps seem to be controlling this well. Upon further questioning he tells me that he also is not able to lie flat in the bed due to his congestive heart failure and difficulty breathing. For that reason he sleeps in his recliner. To make matters worse his recliner also cannot even hold his legs up so instead of even being somewhat elevated they pretty much hang down to the floor. This is the way he sleeps each night which is definitely counterproductive to everything else that were  attempting to do from the standpoint of controlling his fluid. Nonetheless I think that he potentially could benefit from a hospital bed although this would be something that his primary care provider would likely have to order since anything that is order on our side has to be directly related to wound care and again the hospital bed is not necessarily a direct relation to although I think it does contribute to his overall wound status on his lower extremities. 11/07/18 on evaluation today patient appears to be doing better in regard to his bilateral lower extremity. He's been tolerating the dressing changes without complication. We did in the interim since I last saw him switch to just using extras orbiting new alginate and that seems to have done much better for him. I'm very pleased with the overall progress that is made. 11/14/18 on evaluation today patient appears to be redoing rather well in regard to his left lower extremity ulcers which are the only ones remaining at this point. Fortunately there's no signs of active infection at this time which is good news. Overall been Adam Merritt, Adam Merritt ([id number]) very pleased with how things seem to be progressing currently. No fevers, chills, nausea, or vomiting noted at this time. 11/20/17 on evaluation today patient actually appears to be doing quite well in regard to his lower extremities on the left on the right he has several blisters that showed up although there's some question about whether or not he has  had his broker compression wraps on like he was supposed to or not. Fortunately there's no signs of active infection at this time which is good news. Unfortunately though he is doing well from the standpoint of the left leg the right leg is not doing as well again this is when we did not wrap last week. 11/28/18 on evaluation today patient appears to be doing well in regard to his bilateral lower extremities is left appears to be healed is right is  not healed but is very close to being so. Overall very pleased with how things seem to be progressing. Patient is likewise happy that things are doing well. 12/09/18 on evaluation today patient actually appears to be completely healed which is excellent news. He actually seems to be doing well in regard to the swelling of the bilateral lower extremities which is also great news. Overall very pleased with how things seem to be progressing. Readmission: 03/18/2019 upon evaluation today patient presents for reevaluation concerning issues that he is having with his left lower extremity where he does have a wound noted upon inspection today. He also has a wound on the right second toe on the tip where it is apparently been rubbing in his shoe I did look at issues and you can actually see where this has been occurring as well. Fortunately there is no signs of infection with regard to the toe necessarily although it is very swollen compared to normal that does have me somewhat concerned about the possibility of further evaluation for infection/osteomyelitis. I would recommend an x-ray to start with. Otherwise he states that it is been 1-2 weeks that he has had the draining in regard to left lower extremity he does not know how this happened he has been wearing his compression appropriately and I do see that as well today but nonetheless I do think that he needs to continue to be very cautious with regard to elevation as well. Patient History Information obtained from Patient. Allergies Corticosteroids (Glucocorticoids) Family History Cancer - Paternal Grandparents, Kidney Disease - Siblings, Lung Disease - Mother, No family history of Diabetes, Heart Disease, Hereditary Spherocytosis, Hypertension, Seizures, Stroke, Thyroid Problems, Tuberculosis. Social History Never smoker, Marital Status - Married, Alcohol Use - Never, Drug Use - No History, Caffeine Use - Never. Medical History Eyes Denies  history of Cataracts, Glaucoma, Optic Neuritis Ear/Nose/Mouth/Throat Denies history of Chronic sinus problems/congestion, Middle ear problems Hematologic/Lymphatic Patient has history of Anemia - aplastic anemia, Lymphedema Denies history of Hemophilia, Human Immunodeficiency Virus, Sickle Cell Disease Respiratory Patient has history of Chronic Obstructive Pulmonary Disease (COPD), Sleep Apnea Denies history of Aspiration, Asthma, Pneumothorax, Tuberculosis Cardiovascular Patient has history of Congestive Heart Failure, Coronary Artery Disease, Hypertension, Peripheral Venous Disease Denies history of Angina, Arrhythmia, Deep Vein Thrombosis, Hypotension, Myocardial Infarction, Peripheral Arterial Disease, Phlebitis, Vasculitis Gastrointestinal Denies history of Cirrhosis , Colitis, Crohn s, Hepatitis A, Hepatitis B, Hepatitis Adam Merritt, Adam Merritt ([id number]) Endocrine Patient has history of Type II Diabetes Denies history of Type I Diabetes Genitourinary Denies history of End Stage Renal Disease Immunological Denies history of Lupus Erythematosus, Raynaud s, Scleroderma Integumentary (Skin) Denies history of History of Burn, History of pressure wounds Musculoskeletal Patient has history of Osteoarthritis Denies history of Gout, Rheumatoid Arthritis, Osteomyelitis Neurologic Patient has history of Neuropathy Denies history of Dementia, Quadriplegia, Paraplegia, Seizure Disorder Oncologic Patient has history of Received Chemotherapy - last dose 2006 Denies history of Received Radiation Psychiatric Denies history of Anorexia/bulimia, Confinement Anxiety Hospitalization/Surgery History -  bronchitis. Medical And Surgical History Notes Respiratory home O2 at night as needed Cardiovascular varicose veins Neurologic CVA - 1992 Objective Constitutional patient is hypertensive.. pulse regular and within target range for patient.Marland Kitchen respirations regular, non-labored and within  target range for patient.Marland Kitchen temperature within target range for patient.. Chronically ill appearing but in no apparent acute distress. Vitals Time Taken: 1:05 PM, Height: 66 in, Source: Stated, Weight: 297 lbs, Source: Stated, BMI: 47.9, Temperature: 97.7 F, Pulse: 72 bpm, Respiratory Rate: 18 breaths/min, Blood Pressure: 146/62 mmHg. Ears, Nose, Mouth, and Throat no gross abnormality of ear auricles or external auditory canals. normal hearing noted during conversation. mucus membranes moist. Respiratory normal breathing without difficulty. clear to auscultation bilaterally. Cardiovascular regular rate and rhythm with normal S1, S2. 2+ dorsalis pedis/posterior tibialis pulses. 1+ pitting edema of the bilateral lower Adam Merritt, Adam Merritt. ([id number]) extremities. Gastrointestinal (GI) soft, non-tender, non-distended, +BS. no ventral hernia noted. Musculoskeletal Patient unable to walk without assistance of devices. no significant deformity or arthritic changes, no loss or range of motion, no clubbing. Psychiatric this patient is able to make decisions and demonstrates good insight into disease process. Alert and Oriented x 3. pleasant and cooperative. General Notes: Upon inspection today patient's wound bed currently on the left lower extremity showed signs of potential infection. In fact it appears that he may have an infection with Pseudomonas based on the drainage that we noted at this point. There was no need for sharp debridement at either wound location today. I do not see any obvious signs of infection in regard to the toe ulcer on the right second toe. Integumentary (Hair, Skin) Wound #24 status is Open. Original cause of wound was Gradually Appeared. The wound is located on the Right,Distal Toe Second. The wound measures 1.1cm length x 0.9cm width x 0.1cm depth; 0.778cm^2 area and 0.078cm^3 volume. There is Fat Layer (Subcutaneous Tissue) Exposed exposed. There is no tunneling or  undermining noted. There is a small amount of serous drainage noted. The wound margin is flat and intact. There is large (67-100%) red granulation within the wound bed. There is a small (1-33%) amount of necrotic tissue within the wound bed including Adherent Slough. Wound #25 status is Open. Original cause of wound was Gradually Appeared. The wound is located on the Left,Anterior Lower Leg. The wound measures 1.8cm length x 7.5cm width x 0.1cm depth; 10.603cm^2 area and 1.06cm^3 volume. There is Fat Layer (Subcutaneous Tissue) Exposed exposed. There is no tunneling or undermining noted. There is a large amount of purulent drainage noted. The wound margin is flat and intact. There is no granulation within the wound bed. There is a large (67-100%) amount of necrotic tissue within the wound bed including Adherent Slough. 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 Wound #25 Left,Anterior Lower Leg: Adam Merritt, Adam Merritt ([id number]) May shower with protection. - Please do not get your wraps wet Skin Barriers/Peri-Wound Care: Wound #25 Left,Anterior Lower Leg: Moisturizing lotion Primary Wound Dressing: Wound #24 Right,Distal Toe Second: Silver Alginate Wound #25 Left,Anterior Lower Leg: Silver Alginate Secondary Dressing: Wound #24 Right,Distal Toe  Second: Gauze and Kerlix/Conform Wound #25 Left,Anterior Lower Leg: XtraSorb Dressing Change Frequency: Wound #24 Right,Distal Toe Second: Change dressing every other day. Wound #25  Left,Anterior Lower Leg: Change dressing every week Follow-up Appointments: Wound #24 Right,Distal Toe Second: Return Appointment in 1 week. Nurse Visit as needed Wound #25 Left,Anterior Lower Leg: Return Appointment in 1 week. Nurse Visit as needed Edema Control: Wound #24 Right,Distal Toe Second: Patient to wear own Velcro compression garment. Elevate legs to the level of the heart and pump ankles as often as possible Compression Pump: Use compression pump on left lower extremity for 60 minutes, twice daily. Wound #25 Left,Anterior Lower Leg: 4-Layer Compression System - Left Lower Extremity. Compression Pump: Use compression pump on left lower extremity for 60 minutes, twice daily. Compression Pump: Use compression pump on right lower extremity for 60 minutes, twice daily. Laboratory ordered were: Wound culture routine Radiology ordered were: X-ray, foot 1. My suggestion at this time is going to be that for both wounds we initiate a silver alginate dressing which I think will do well to help with moisture control. 2. I am also going to suggest a 4 layer compression wrap for the left lower extremity which I think will help to keep the edema under good control and hopefully allow this area to heal more appropriately. 3. In regard to the toe ulcer I am good also recommend that we use a piece of gauze rolled in order to try to straighten the toe out and prevent this from rubbing when he is walking. I think this is the culprit for why he is having issues that he is at this time. He is really not able to get any other shoes on other than the bedroom like shoes that he wears into the clinic today and I can definitely see where his toes been rubbing in the bottom of his shoe. 4. I am get a send the patient for an x-ray of his right foot in order to evaluate for any evidence of osteomyelitis. We will see patient back for reevaluation in 1 week here in the clinic. If anything worsens or  changes patient will contact our office for additional recommendations. Electronic Signature(s) Signed: 03/18/2019 1:48:05 PM By: Melynda Ripple ([id number]) Previous Signature: 03/18/2019 1:47:28 PM Version By: Adam Keeler PA-C Entered By: Adam Merritt on 03/18/2019 13:48:04 Adam Merritt ([id number]) -------------------------------------------------------------------------------- ROS/PFSH Details Patient Name: Adam Merritt Date of Service: 03/18/2019 1:15 PM Medical Record Number: [id number] Patient Account Number: [id number] Date of Birth/Sex: [birth year] (73 y.o. M) Treating RN: Harold Barban Primary Care Provider: Clayborn Merritt Other Clinician: Referring Provider: Clayborn Merritt Treating Provider/Extender: Adam Merritt [name]: 0 Information Obtained From Patient Eyes Medical History: Negative for: Cataracts; Glaucoma; Optic Neuritis Ear/Nose/Mouth/Throat Medical History: Negative for: Chronic sinus problems/congestion; Middle ear problems Hematologic/Lymphatic Medical History: Positive for: Anemia - aplastic anemia; Lymphedema Negative for: Hemophilia; Human Immunodeficiency Virus; Sickle Cell Disease Respiratory Medical History: Positive for: Chronic Obstructive Pulmonary Disease (COPD); Sleep Apnea Negative for: Aspiration; Asthma; Pneumothorax; Tuberculosis Past Medical History Notes: home O2 at night as needed Cardiovascular Medical History: Positive for: Congestive Heart Failure; Coronary Artery Disease; Hypertension; Peripheral Venous Disease Negative for: Angina; Arrhythmia; Deep Vein Thrombosis; Hypotension; Myocardial Infarction; Peripheral Arterial Disease; Phlebitis; Vasculitis Past Medical History Notes: varicose veins Gastrointestinal Medical History: Negative for: Cirrhosis ; Colitis; Crohnos; Hepatitis A; Hepatitis B; Hepatitis C Endocrine Medical History: Positive for: Type II Diabetes Negative for: Type I  Diabetes Time with diabetes: since 2006 Treated with: Insulin Blood sugar tested every day: Yes Tested : Blood sugar testing results: Adam Merritt, Adam Merritt ([id number]) Breakfast: 209 Genitourinary Medical History: Negative for: End Stage Renal Disease Immunological Medical History: Negative for: Lupus Erythematosus; Raynaudos; Scleroderma Integumentary (Skin) Medical History: Negative for: History of Burn; History of pressure wounds Musculoskeletal Medical History: Positive for: Osteoarthritis Negative for: Gout; Rheumatoid Arthritis; Osteomyelitis Neurologic Medical History: Positive for: Neuropathy Negative for: Dementia; Quadriplegia; Paraplegia; Seizure Disorder Past Medical History Notes: CVA - 1992 Oncologic Medical History: Positive for: Received Chemotherapy - last dose 2006 Negative for: Received Radiation Psychiatric Medical History: Negative for: Anorexia/bulimia; Confinement Anxiety Immunizations Pneumococcal Vaccine: Received Pneumococcal Vaccination: Yes Implantable Devices No devices added Hospitalization / Surgery History Type of Hospitalization/Surgery bronchitis Family and Social History Cancer: Yes - Paternal Grandparents; Diabetes: No; Heart Disease: No; Hereditary Spherocytosis: No; Hypertension: No; Kidney Disease: Yes - Siblings; Lung Disease: Yes - Mother; Seizures: No; Stroke: No; Thyroid Problems: No; Tuberculosis: No; Never smoker; Marital Status - Married; Alcohol Use: Never; Drug Use: No History; Caffeine Use: Never; Financial Concerns: No; Food, Clothing or Shelter Needs: No; Support System Lacking: No; Transportation Concerns: No ERNESTINE, LANGWORTHY ([id number]) Electronic Signature(s) Signed: 03/19/2019 4:37:37 PM By: Harold Barban Signed: 03/19/2019 6:12:50 PM By: Adam Keeler PA-C Entered By: Harold Barban on 03/18/2019 13:15:07 Adam Merritt  ([id number]) -------------------------------------------------------------------------------- SuperBill Details Patient Name: Adam Merritt Date of Service: 03/18/2019 Medical Record Number: [id number] Patient Account Number: [id number] Date of Birth/Sex: 20-Jul-1945 (73 y.o. M) Treating RN: Adam Merritt Primary Care Provider: Clayborn Merritt Other Clinician: Referring Provider: Clayborn Merritt Treating Provider/Extender: Adam Merritt [name]: 0 Diagnosis Coding ICD-10 Codes Code Description I89.0 Lymphedema, not elsewhere classified E11.621 Type 2 diabetes mellitus with foot ulcer L97.512 Non-pressure chronic ulcer of other part of right foot with fat layer exposed L97.822 Non-pressure chronic ulcer of other part of left lower leg with fat layer exposed E66.09 Other obesity due to excess calories Facility Procedures CPT4 Code: 24580998 Description: 33825 - WOUND CARE VISIT-LEV 3 EST PT Modifier: Quantity: 1 CPT4 Code: 05397673 Description: (Facility Use Only) 29581LT - [place] LWR LT LEG Modifier: Quantity: 1 Physician Procedures CPT4 Code Description: 4193790 24097 - WC PHYS LEVEL 4 - EST PT ICD-10 Diagnosis Description I89.0 Lymphedema, not elsewhere classified E11.621 Type 2 diabetes mellitus with foot ulcer L97.512 Non-pressure chronic ulcer of other part of right foot with  fat L97.822 Non-pressure chronic ulcer of other part of left lower leg with Modifier: layer exposed fat layer expos Quantity: 1 ed Electronic Signature(s) Signed: 03/18/2019 5:32:28 PM By: Adam Merritt Signed: 03/19/2019 6:12:50 PM By: Adam Keeler PA-C Previous Signature: 03/18/2019 1:48:54 PM Version By: Adam Keeler PA-C Entered By: Adam Merritt on 03/18/2019 14:44:35

## 2019-03-21 ENCOUNTER — Other Ambulatory Visit: Payer: Self-pay

## 2019-03-21 DIAGNOSIS — E11621 Type 2 diabetes mellitus with foot ulcer: Secondary | ICD-10-CM | POA: Diagnosis not present

## 2019-03-21 NOTE — Progress Notes (Signed)
Adam Merritt ([id number]) Visit Report for 03/21/2019 Arrival Information Details Patient Name: Adam Merritt, Adam Merritt Date of Service: 03/21/2019 11:00 AM Medical Record Number: [id number] Patient Account Number: [id number] Date of Birth/Sex: 1945/09/17 (73 y.o. M) Treating RN: Cornell Barman Primary Care Charlii Yost: [name] Other Clinician: Referring Finnigan Warriner: [name] Treating Mahmood Boehringer/Extender: Melburn Hake, [name]: 0 Visit Information History Since Last Visit Added or deleted any medications: No Patient Arrived: Cane Any new allergies or adverse reactions: No Arrival Time: 11:26 Had a fall or experienced change in No Accompanied By: wife activities of daily living that may affect Transfer Assistance: None risk of falls: Patient Identification Verified: Yes Signs or symptoms of abuse/neglect since last visito No Secondary Verification Process Yes Hospitalized since last visit: No Completed: Implantable device outside of the clinic excluding No Patient Has Alerts: Yes cellular tissue based products placed in the center Patient Alerts: Patient on Blood since last visit: Thinner Has Dressing in Place as Prescribed: Yes DMII Has Compression in Place as Prescribed: Yes aspirin 325 ABI 10/22/17 L 1.15 Pain Present Now: No R .98 TBI L .66 R .61 Electronic Signature(s) Signed: 03/21/2019 11:27:01 AM By: Gretta Cool, BSN, RN, CWS, Kim RN, BSN Entered By: Gretta Cool, BSN, RN, CWS, Kim on 03/21/2019 11:27:01 Adam Merritt ([id number]) -------------------------------------------------------------------------------- Compression Therapy Details Patient Name: Adam Merritt Date of Service: 03/21/2019 11:00 AM Medical Record Number: [id number] Patient Account Number: [id number] Date of Birth/Sex: [birth year] (73 y.o. M) Treating RN: Cornell Barman Primary Care Humphrey Guerreiro: [name] Other Clinician: Referring Charlese Gruetzmacher: [name] Treating Esaiah Wanless/Extender: Melburn Hake, [name] in  Treatment: 0 Compression Therapy Performed for Wound Assessment: Wound #25 Left,Anterior Lower Leg Performed By: Clinician Cornell Barman, RN Compression Type: Three Layer Pre Treatment ABI: 1.2 Electronic Signature(s) Signed: 03/21/2019 11:28:49 AM By: Gretta Cool, BSN, RN, CWS, Kim RN, BSN Entered By: Gretta Cool, BSN, RN, CWS, Kim on 03/21/2019 11:28:49 Adam Merritt ([id number]) -------------------------------------------------------------------------------- Encounter Discharge Information Details Patient Name: Adam Merritt, Adam Merritt Date of Service: 03/21/2019 11:00 AM Medical Record Number: [id number] Patient Account Number: [id number] Date of Birth/Sex: 02/12/1946 (73 y.o. M) Treating RN: Cornell Barman Primary Care Deanza Upperman: [name] Other Clinician: Referring Shaughnessy Gethers: [name] Treating Karri Kallenbach/Extender: Melburn Hake, [name]: 0 Encounter Discharge Information Items Discharge Condition: Stable Ambulatory Status: Cane Discharge Destination: Home Transportation: Private Auto Accompanied By: wife Schedule Follow-up Appointment: Yes Clinical Summary of Care: Electronic Signature(s) Signed: 03/21/2019 11:29:49 AM By: Gretta Cool, BSN, RN, CWS, Kim RN, BSN Entered By: Gretta Cool, BSN, RN, CWS, Kim on 03/21/2019 11:29:48 Adam Merritt ([id number]) -------------------------------------------------------------------------------- Wound Assessment Details Patient Name: Adam Merritt Date of Service: 03/21/2019 11:00 AM Medical Record Number: [id number] Patient Account Number: [id number] Date of Birth/Sex: Oct 16, 1945 (73 y.o. M) Treating RN: Cornell Barman Primary Care Chonita Gadea: [name] Other Clinician: Referring Colby Catanese: [name] Treating Trinna Kunst/Extender: Melburn Hake, [name]: 0 Wound Status Wound Number: 24 Primary Diabetic Wound/Ulcer of the Lower Extremity Etiology: Wound Location: Right Toe Second - Distal Wound Open Wounding Event: Gradually  Appeared Status: Date Acquired: 03/08/2019 Comorbid Anemia, Lymphedema, Chronic Obstructive Weeks Of Treatment: 0 History: Pulmonary Disease (COPD), Sleep Apnea, Clustered Wound: No Congestive Heart Failure, Coronary Artery Disease, Hypertension, Peripheral Venous Disease, Type II Diabetes, Osteoarthritis, Neuropathy, Received Chemotherapy Wound Measurements Length: (cm) 1.1 Width: (cm) 0.8 Depth: (cm) 0.1 Area: (cm) 0.691 Volume: (cm) 0.069 % Reduction in Area: 11.2% % Reduction in Volume: 11.5% Epithelialization: Large (67-100%) Wound Description Classification: Grade 2 Wound Margin: Flat and Intact  Exudate Amount: Small Exudate Type: Serous Exudate Color: amber Foul Odor After Cleansing: No Slough/Fibrino Yes Wound Bed Granulation Amount: Large (67-100%) Exposed Structure Granulation Quality: Red Fascia Exposed: No Necrotic Amount: Small (1-33%) Fat Layer (Subcutaneous Tissue) Exposed: Yes Necrotic Quality: Adherent Slough Tendon Exposed: No Muscle Exposed: No Joint Exposed: No Bone Exposed: No Treatment Notes Wound #24 (Right, Distal Toe Second) Notes toe - silvercel and conform; leg - silvercel, xtrasorb, abd and 4 layer wrap with unna to Engineer, production) Signed: 03/21/2019 11:28:08 AM By: Gretta Cool, BSN, RN, CWS, Kim RN, BSN Entered By: Gretta Cool, BSN, RN, CWS, Kim on 03/21/2019 11:28:08 NAYDEN, CZAJKA ([id number], Merritt ([id number]) -------------------------------------------------------------------------------- Wound Assessment Details Patient Name: Adam Merritt Date of Service: 03/21/2019 11:00 AM Medical Record Number: [id number] Patient Account Number: [id number] Date of Birth/Sex: 01/21/1946 (73 y.o. M) Treating RN: Cornell Barman Primary Care Smita Lesh: [name] Other Clinician: Referring Tequan Redmon: [name] Treating Shakeila Pfarr/Extender: Melburn Hake, [name]: 0 Wound Status Wound Number: 25 Primary Diabetic  Wound/Ulcer of the Lower Extremity Etiology: Wound Location: Left Lower Leg - Anterior Wound Open Wounding Event: Gradually Appeared Status: Date Acquired: 03/08/2019 Comorbid Anemia, Lymphedema, Chronic Obstructive Weeks Of Treatment: 0 History: Pulmonary Disease (COPD), Sleep Apnea, Clustered Wound: No Congestive Heart Failure, Coronary Artery Disease, Hypertension, Peripheral Venous Disease, Type II Diabetes, Osteoarthritis, Neuropathy, Received Chemotherapy Wound Measurements Length: (cm) 1.8 Width: (cm) 5.5 Depth: (cm) 0.1 Area: (cm) 7.775 Volume: (cm) 0.778 % Reduction in Area: 26.7% % Reduction in Volume: 26.6% Epithelialization: None Wound Description Classification: Grade 2 Wound Margin: Flat and Intact Exudate Amount: Large Exudate Type: Purulent Exudate Color: yellow, brown, green Wound Bed Granulation Amount: None Present (0%) Exposed Structure Necrotic Amount: Large (67-100%) Fascia Exposed: No Necrotic Quality: Adherent Slough Fat Layer (Subcutaneous Tissue) Exposed: Yes Tendon Exposed: No Muscle Exposed: No Joint Exposed: No Bone Exposed: No Treatment Notes Wound #25 (Left, Anterior Lower Leg) Notes toe - silvercel and conform; leg - silvercel, xtrasorb, abd and 4 layer wrap with unna to Engineer, production) Signed: 03/21/2019 11:28:19 AM By: Gretta Cool, BSN, RN, CWS, Kim RN, BSN Entered By: Gretta Cool, BSN, RN, CWS, Kim on 03/21/2019 11:28:18

## 2019-03-22 LAB — AEROBIC CULTURE W GRAM STAIN (SUPERFICIAL SPECIMEN): Gram Stain: NONE SEEN

## 2019-03-24 ENCOUNTER — Other Ambulatory Visit: Payer: Self-pay | Admitting: Nurse Practitioner

## 2019-03-24 MED ORDER — ONETOUCH DELICA LANCETS 33G MISC: 11 refills | Status: DC

## 2019-03-25 ENCOUNTER — Encounter: Payer: Medicare Other | Admitting: Physician Assistant

## 2019-03-25 ENCOUNTER — Other Ambulatory Visit: Payer: Self-pay | Admitting: Nurse Practitioner

## 2019-03-25 ENCOUNTER — Other Ambulatory Visit: Payer: Self-pay

## 2019-03-25 DIAGNOSIS — E11621 Type 2 diabetes mellitus with foot ulcer: Secondary | ICD-10-CM | POA: Diagnosis not present

## 2019-03-25 MED ORDER — BD PEN NEEDLE NANO U/F 32G X 4 MM MISC: 11 refills | Status: DC

## 2019-03-25 NOTE — Progress Notes (Addendum)
TYRESE, CAPRIOTTI ([id number]) Visit Report for 03/25/2019 Chief Complaint Document Details Patient Name: Adam Merritt, Adam Merritt Date of Service: 03/25/2019 1:30 PM Medical Record Number: [id number] Patient Account Number: [id number] Date of Birth/Sex: 1946-01-16 (73 y.o. M) Treating RN: Montey Hora Primary Care Provider: [name] Other Clinician: Referring Provider: [name] Treating Provider/Extender: Melburn Hake, Presley Gora [name]: 1 Information Obtained from: Patient Chief Complaint Right toe and left Merritt Ulcer Electronic Signature(s) Signed: 03/25/2019 1:46:25 PM By: Worthy Keeler PA-C Entered By: Worthy Keeler on 03/25/2019 13:46:25 Adam Merritt ([id number]) -------------------------------------------------------------------------------- Debridement Details Patient Name: Adam Merritt Date of Service: 03/25/2019 1:30 PM Medical Record Number: [id number] Patient Account Number: [id number] Date of Birth/Sex: 08/16/1945 (73 y.o. M) Treating RN: Montey Hora Primary Care Provider: [name] Other Clinician: Referring Provider: [name] Treating Provider/Extender: Melburn Hake, Ailis Rigaud [name]: 1 Debridement Performed for Wound #24 Right,Distal Toe Second Assessment: Performed By: Physician STONE III, Pamelyn Bancroft E., PA-C Debridement Type: Debridement Severity of Tissue Pre Fat layer exposed Debridement: Level of Consciousness (Pre- Awake and Alert procedure): Pre-procedure Verification/Time Yes - 13:58 Out Taken: Start Time: 13:58 Pain Control: Lidocaine 4% Topical Solution Total Area Debrided (L x W): 0.9 (cm) x 0.6 (cm) = 0.54 (cm) Tissue and other material Viable, Non-Viable, Callus, Slough, Subcutaneous, Slough debrided: Level: Skin/Subcutaneous Tissue Debridement Description: Excisional Instrument: Curette Bleeding: Minimum Hemostasis Achieved: Pressure End Time: 14:02 Procedural Pain: 0 Post Procedural Pain: 0 Response to Treatment:  Procedure was tolerated well Level of Consciousness Awake and Alert (Post-procedure): Post Debridement Measurements of Total Wound Length: (cm) 0.9 Width: (cm) 0.6 Depth: (cm) 0.1 Volume: (cm) 0.042 Character of Wound/Ulcer Post Debridement: Improved Severity of Tissue Post Debridement: Fat layer exposed Post Procedure Diagnosis Same as Pre-procedure Electronic Signature(s) Signed: 03/25/2019 4:44:40 PM By: Montey Hora Signed: 03/25/2019 7:13:08 PM By: Worthy Keeler PA-C Entered By: Montey Hora on 03/25/2019 14:01:13 Adam Merritt ([id number]) -------------------------------------------------------------------------------- HPI Details Patient Name: Adam Merritt Date of Service: 03/25/2019 1:30 PM Medical Record Number: [id number] Patient Account Number: [id number] Date of Birth/Sex: [birth year] (73 y.o. M) Treating RN: Montey Hora Primary Care Provider: [name] Other Clinician: Referring Provider: [name] Treating Provider/Extender: Melburn Hake, Laurier Jasperson [name]: 1 History of Present Illness HPI Description: 10/18/17-He is here for initial evaluation of bilateral lower extremity ulcerations in the presence of venous insufficiency and lymphedema. He has been seen by vascular medicine in the past, Dr. Lucky Cowboy, last seen in 2016. He does have a history of abnormal ABIs, which is to be expected given his lymphedema and venous insufficiency. According to Epic, it appears that all attempts for arterial evaluation and/or angiography were not follow through with by patient. He does have a history of being seen in lymphedema clinic in 2018, stopped going approximately 6 months ago stating "it didn't do any good". He does not have lymphedema pumps, he does not have custom fit compression wrap/stockings. He is diabetic and his recent A1c last month was 7.6. He admits to chronic bilateral lower extremity pain, no change in pain since blister and ulceration development.  He is currently being treated with Levaquin for bronchitis. He has home health and we will continue. 10/25/17-He is here in follow-up evaluation for bilateral lower extremity ulcerationssubtle he remains on Levaquin for bronchitis. Right lower extremity with no evidence of drainage or ulceration, persistent left lower extremity ulceration. He states that home health has not been out since his appointment. He went to [facility] on Tuesday, studies revealed: RIGHT ABI 0.9, TBI 0.6 LEFT ABI 1.1, TBI 0.6 with triphasic flow bilaterally. We will continue his same treatment plan. He has been educated on compression therapy and need for elevation. He will benefit from lymphedema pumps 11/01/17-He is here in follow-up evaluation for left lower extremity ulcer. The right lower extremity remains healed. He has home health services, but they have not been out to see the patient for 2-3 weeks. He states it home health physical therapy changed his dressing yesterday after therapy; he placed Ace wrap compression. We are still waiting for lymphedema pumps, reordered d/t need for company change. 11/08/17-He is here in follow-up evaluation for left lower extremity ulcer. It is improved. Edema is significantly improved with compression therapy. We will continue with same treatment plan and he will follow-up next week. No word regarding lymphedema pumps 11/15/17-He is here in follow-up evaluation for left lower extremity ulcer. He is healed and will be discharged from wound care services. I have reached out to [facility] regarding his lymphedema pumps. They have been unable to reach the patient; the contact number they had with the patient's wife's cell phone and she has not answered any unrecognized calls. Contact should be made today, trial planned for next week; [facility] will continue to follow 11/27/17 on evaluation today patient has multiple blistered areas over the right lower extremity  his left lower extremity appears to be doing okay. These blistered areas show signs of no infection which is great news. With that being said he did have some necrotic skin overlying which was mechanically debrided away with saline and gauze today without complication. Overall post debridement the wounds appear to be doing better but in general his swelling seems to be increased. This is obviously not good news. I think this is what has given rise to the blisters. 12/04/17 on evaluation today patient presents for follow-up concerning his bilateral lower extremity edema in the right lower extremity ulcers. He has been tolerating the dressing changes without complication. With that being said he has had no real issues with the wraps which is also good news. Overall I'm pleased with the progress he's been making. 12/11/17 on evaluation today patient appears to be doing rather well in regard to his right lateral lower extremity ulcer. He's been tolerating the dressing changes without complication. Fortunately there does not appear to be any evidence of infection at this time. Overall I'm pleased with the progress that is being made. Unfortunately he has been in the hospital due to having what sounds to be a stomach virus/flu fortunately that is starting to get better. 12/18/17 on evaluation today patient actually appears to be doing very well in regard to his bilateral lower extremities the swelling is under fairly good control his lymphedema pumps are still not up and running quite yet. With that being said he does have several areas of opening noted as far as wounds are concerned mainly over the left lower extremity. With that being said I do believe once he gets lymphedema pumps this would least help you mention some the fluid and preventing this from occurring. Hopefully that will be set up soon sleeves are Artie in place at his home he just waiting for the machine. 12/25/17 on evaluation today patient  actually appears to be doing excellent in fact all of his ulcers appear to have resolved his Adam Merritt, Adam Merritt. ([id number]) legs appear very well. I do think he needs compression stockings we have discussed  this and they are actually going to go to [place] today to elastic therapy to get this fitted for him. I think that is definitely a good thing to do. Readmission: 04/09/18 upon evaluation today this patient is seen for readmission due to bilateral lower extremity lymphedema. He has significant swelling of his extremities especially on the left although the right is also swollen he has weeping from both sides. There are no obvious open wounds at this point. Fortunately he has been doing fairly well for quite a bit of time since I last saw him. Nonetheless unfortunately this seems to have reopened and is giving quite a bit of trouble. He states this began about a week ago when he first called [place] to get in to be seen. No fevers, chills, nausea, or vomiting noted at this time. He has not been using his lymphedema pumps due to the fact that they won't fit on his leg at this point likewise is also not been using his compression for essentially the same reason. 04/16/18 upon evaluation today patient actually appears to be doing a little better in regard to the fluid in his bilateral lower extremities. With that being said he's had three falls since I saw him last week. He also states that he's been feeling very poorly. I was concerned last week and feel like that the concern is still there as far as the congestion in his chest is concerned he seems to be breathing about the same as last week but again he states he's very weak he's not even able to walk further than from the chair to the door. His wife had to buy a wheelchair just to be able to get them out of the house to get to the appointment today. This has me very concerned. 04/23/18 on evaluation today patient actually appears to be doing much better  than last week's evaluation. At that time actually had to transport him to the ER via EMS and he subsequently was admitted for acute pulmonary edema, acute renal failure, and acute congestive heart failure. Fortunately he is doing much better. Apparently they did dialyze him and were able to take off roughly 35 pounds of fluid. Nonetheless he is feeling much better both in regard to his breathing and he's able to get around much better at this time compared to previous. Overall I'm very happy with how things are at this time. There does not appear to be any evidence of infection currently. No fevers, chills, nausea, or vomiting noted at this time. 04/30/2018 patient seen today for follow-up and management of bilateral lower extremity lymphedema. He did express being more sad today than usual due to the recent loss of his dog. He states that he has been compliant with using the lymphedema pumps. However he does admit a minute over the last 2-3 days he has not been using the pumps due to the recent loss of his dog. At this time there is no drainage or open wounds to his lower extremities. The left leg edema is measuring smaller today. Still has a significant amount of edema on bilateral lower extremities With dry flaky skin. He will be referred to the lymphedema clinic for further management. Will continue 3 layer compression wraps and follow-up in 1-2 weeks.Denies any pain, fever, chairs recently. No recent falls or injuries reported during this visit. 05/07/18 on evaluation today patient actually appears to be doing very well in regard to his lower extremities in general all things considered. With that  being said he is having some pain in the legs just due to the amount of swelling. He does have an area where he had a blister on the left lateral lower extremity this is open at this point other than that there's nothing else weeping at this time. 05/14/18 on evaluation today patient actually appears  to be doing excellent all things considered in regard to his lower extremities. He still has a couple areas of weeping on each leg which has continued to be the issue for him. He does have an appointment with the lymphedema clinic although this isn't until February 2020. That was the earliest they had. In the meantime he has continued to tolerate the compression wraps without complication. 05/28/18 on evaluation today patient actually appears to be doing more poorly in regard to his left lower extremity where he has a wound open at this point. He also had a fall where he subsequently injured his right great toe which has led to an open wound at the site unfortunately. He has been tolerating the dressing changes without complication in general as far as the wraps are concerned that he has not been putting any dressing on the left 1st toe ulcer site. 06/11/18 on evaluation today patient appears to be doing much worse in regard to his bilateral lower extremity ulcers. He has been tolerating the dressing changes without complication although his legs have not been wrapped more recently. Overall I am not very pleased with the way his legs appear. I do believe he needs to be back in compression wraps he still has not received his compression wraps from the [facility] hospital as of yet. 06/18/18 on evaluation today patient actually appears to be doing significantly better than last time I saw him. He has been tolerating the compression wraps without complication in the circumferential ulcers especially appear to be doing much better. His toe ulcer on the right in regard to the great toe is better although not as good as the legs in my pinion. No fevers chills noted Adam Merritt, Adam Merritt ([id number]) 07/02/18 on evaluation today patient appears to be doing much better in regard to his lower extremity ulcers. Unfortunately since I last saw him he's had the distal portion of his right great toe if he dated it sounds as if  this actually went downhill very quickly. I had only seen him a few days prior and the toe did not appear to be infected at that point subsequently became infected very rapidly and it was decided by the surgeon that the distal portion of the toe needed to be removed. The patient seems to be doing well in this regard he tells me. With that being said his lower extremities are doing better from the standpoint of the wounds although he is significantly swollen at this point. 07/09/18 on evaluation today patient appears to be doing better in regard to the wounds on his lower extremities. In fact everything is almost completely healed he is just a small area on the left posterior lower extremity that is open at this point. 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 With that being said he needs some kind of compression it does sound like he may not be wearing his compression, that is the wraps, during the entire time between when he's here visit to visit.  Apparently his wife took the current one off because it began to "fall apart". 07/16/18 on evaluation today patient appears to be extremely swollen especially in regard to his left lower extremity unfortunately. He also has a new skin tear over the left lower extremity and there's a smaller area on the right lower extremity as well. Unfortunately this seems to be due in part to blistering and fluid buildup in his leg. He did get the reduction wraps that were ordered by the [facility] hospital for him to go to lymphedema clinic. With that being said his wounds on the legs have not healed to the point to where they would likely accept them as a patient lymphedema clinic currently. We need to try to get this to heal. With that being said he's been taking his wraps off which is not doing  him any favors at this point. In fact this is probably quite counterproductive compared to what needs to occur. We will likely need to increase to a four layer compression wrap and continue to also utilize elevation and he has to keep the wraps on not take them off as he's been doing currently hasn't had a wrap on since Saturday. 07/23/18 on evaluation today patient appears to be doing much better in regard to his bilateral lower extremities. In fact his left lower extremity which was the largest is actually 15 cm smaller today compared to what it was last time he was here in our clinic. This is obviously good news after just one week. Nonetheless the differences he actually kept the wraps on during the entire week this time. That's not typical for him. I do believe he understands a little bit better now the severity of the situation and why it's important for him to keep these wraps on. 07/30/18 on evaluation today patient actually appears to be doing rather well in regard to his lower extremities. His legs are much smaller than they have been in the past and he actually has only one very small rudder superficial region remaining that is not closed on the left lateral/posterior lower extremity even this is almost completely close. I do believe likely next week he will be healed without any complications. I do think we need to continue the wraps however this seems to be beneficial for him. I also think it may be a good time for [place] to go ahead and see about getting the appointment with the lymphedema clinic which is supposed to be made for him in order to keep this moving along and hopefully get them into compression wraps that will in the end help him to remain healed. 08/06/18 on evaluation today patient actually appears to be doing very well in regard as bilateral lower extremities. In fact his wounds appear to be completely healed at this time. He does have bilateral lymphedema which has been  extremely well controlled with the compression wraps. He is in the process of getting appointment with the lymphedema clinic we have made this referral were just waiting to hear back on the schedule time. We need to follow up on that today as well. 08/13/18 on evaluation today patient actually appears to be doing very well in regard to his bilateral lower extremities there are no open wounds at this point. We are gonna go ahead and see about ordering the Velcro compression wraps for him had a discussion with them about [place] doing it versus the [place] they feel like they can definitely afford going ahead and get  the wraps themselves and they would prefer to try to avoid having to go to the lymphedema clinic if it all possible which I completely understand. As long as he has good compression I'm okay either way. 3/17//20 on evaluation today patient actually appears to be doing well in regard to his bilateral lower extremity ulcers. He has been tolerating the dressing changes without complication specifically the compression wraps. Overall is had no issues my fingers finding that I see at this point is that he is having trouble with constipation. He tells me he has not been able to go to the bathroom for about six days. He's taken to over-the-counter oral laxatives unfortunately this is not helping. He has not contacted his doctor. 08/27/18 on evaluation today patient appears to be doing fairly well in regard to his lower extremities at this point. There does not appear to be any new altars is swelling is very well controlled. We are still waiting for his Velcro compression wraps to arrive that should be sometime in the next week is Artie sent the check for these. 09/03/18 on evaluation today patient appears to be doing excellent in regard to his bilateral lower extremities which he shows Adam Merritt, Adam Merritt. ([id number]) no signs of wound openings in regard to this point. He does have his Velcro compression wraps  which did arrive in the mail since I saw him last week. Overall he is doing excellent in my pinion. 09/13/18 on evaluation today patient appears to be doing very well currently in regard to the overall appearance of his bilateral lower extremities although he's a little bit more swollen than last time we saw him. At that point he been discharged without any open wounds. Nonetheless he has a small open wound on the posterior left lower extremity with some evidence of cellulitis noted as well. Fortunately I feel like he has made good progress overall with regard to his lower extremities from were things used to be. 09/20/18 on evaluation today patient actually appears to be doing much better. The erythematous lower extremity is improving wound itself which is still open appears to be doing much better as far as but appearance as well as pain is concerned overall very pleased in this regard. There's no signs of active infection at this time. 09/27/18 on evaluation today patient's wounds on the lower extremity actually appear to be doing fairly well at this time which is good news. There is no evidence of active infection currently and again is just as left lower extremity were there any wounds at this point anyway. I believe they may be completely healed but again I'm not 100% sure based on evaluation today. I think one more week of observation would likely be a good idea. 10/04/18 on evaluation today patient actually appears to be doing excellent in regard to the left lower extremity which actually appears to be completely healed as of today. Unfortunately he's been having issues with his right lower extremity have a new wound that has opened. Fortunately there's no evidence of active infection at this time which is good news. 10/10/18 upon evaluation today patient actually appears to be doing a little bit worse with the new open area on his right posterior lower extremity. He's been tolerating the dressing  changes without complication. Right now we been using his compression wraps although I think we may need to switch back to actually performing bilateral compression wraps are in the clinic. No fevers, chills, nausea, or vomiting noted at this  time. 10/17/18 on evaluation today patient actually appears to be doing quite well in regard to his bilateral lower extremity ulcers. He has been tolerating the reps without complication although he would prefer not to rewrap his legs as of today. Fortunately there's no signs of active infection which is good news. No fevers, chills, nausea, or vomiting noted at this time. 10/24/18 on evaluation today patient appears to be doing very well in regard to his lower extremities. His right lower extremity is shown signs of healing and his left lower Trinity though not healed appears to be improving which is excellent news. Overall very pleased with how things seem to be progressing at this time. The patient is likewise happy to hear this. His Velcro compression wraps however have not been put on properly will gonna show his wife how to do that properly today. 10/31/18 on evaluation today patient appears to be doing more poorly in regard to his left lower extremity in particular although both lower extremities actually are showing some signs of being worse than my previous evaluation. Unfortunately I'm just not sure that his compression stockings even with the use of the compression/lymphedema pumps seem to be controlling this well. Upon further questioning he tells me that he also is not able to lie flat in the bed due to his congestive heart failure and difficulty breathing. For that reason he sleeps in his recliner. To make matters worse his recliner also cannot even hold his legs up so instead of even being somewhat elevated they pretty much hang down to the floor. This is the way he sleeps each night which is definitely counterproductive to everything else that were  attempting to do from the standpoint of controlling his fluid. Nonetheless I think that he potentially could benefit from a hospital bed although this would be something that his primary care provider would likely have to order since anything that is order on our side has to be directly related to wound care and again the hospital bed is not necessarily a direct relation to although I think it does contribute to his overall wound status on his lower extremities. 11/07/18 on evaluation today patient appears to be doing better in regard to his bilateral lower extremity. He's been tolerating the dressing changes without complication. We did in the interim since I last saw him switch to just using extras orbiting new alginate and that seems to have done much better for him. I'm very pleased with the overall progress that is made. 11/14/18 on evaluation today patient appears to be redoing rather well in regard to his left lower extremity ulcers which are the only ones remaining at this point. Fortunately there's no signs of active infection at this time which is good news. Overall been very pleased with how things seem to be progressing currently. No fevers, chills, nausea, or vomiting noted at this time. 11/20/17 on evaluation today patient actually appears to be doing quite well in regard to his lower extremities on the left on the right he has several blisters that showed up although there's some question about whether or not he has had his broker compression wraps on like he was supposed to or not. Fortunately there's no signs of active infection at this time which is good news. Unfortunately though he is doing well from the standpoint of the left leg the right leg is not doing as well again this Adam Merritt, Adam Merritt. ([id number]) is when we did not wrap last week. 11/28/18 on evaluation  today patient appears to be doing well in regard to his bilateral lower extremities is left appears to be healed is right is  not healed but is very close to being so. Overall very pleased with how things seem to be progressing. Patient is likewise happy that things are doing well. 12/09/18 on evaluation today patient actually appears to be completely healed which is excellent news. He actually seems to be doing well in regard to the swelling of the bilateral lower extremities which is also great news. Overall very pleased with how things seem to be progressing. Readmission: 03/18/2019 upon evaluation today patient presents for reevaluation concerning issues that he is having with his left lower extremity where he does have a wound noted upon inspection today. He also has a wound on the right second toe on the tip where it is apparently been rubbing in his shoe I did look at issues and you can actually see where this has been occurring as well. Fortunately there is no signs of infection with regard to the toe necessarily although it is very swollen compared to normal that does have me somewhat concerned about the possibility of further evaluation for infection/osteomyelitis. I would recommend an x-ray to start with. Otherwise he states that it is been 1-2 weeks that he has had the draining in regard to left lower extremity he does not know how this happened he has been wearing his compression appropriately and I do see that as well today but nonetheless I do think that he needs to continue to be very cautious with regard to elevation as well. 03/25/2019 on evaluation today patient appears to be doing much better in regard to his lower extremity at this time. That is on the left. He did culture positive for Pseudomonas but the good news is he seems to be doing much better the gentamicin we applied topically seems to have done a great job for him. There is no signs of active infection at this time systemically and locally he is doing much better. No fevers, chills, nausea, vomiting, or diarrhea. In regard to his right toe this  seems to be doing much better there is very little pressure noted at this point I did clean up some of the callus today around the edges of the wound as well as the surface of the wound but to be honest he is progressing quite nicely. Electronic Signature(s) Signed: 03/25/2019 2:07:09 PM By: Worthy Keeler PA-C Entered By: Worthy Keeler on 03/25/2019 14:07:08 Adam Merritt ([id number]) -------------------------------------------------------------------------------- Physical Exam Details Patient Name: Adam Merritt Date of Service: 03/25/2019 1:30 PM Medical Record Number: [id number] Patient Account Number: [id number] Date of Birth/Sex: 21-Aug-1945 (73 y.o. M) Treating RN: Montey Hora Primary Care Provider: [name] Other Clinician: Referring Provider: [name] Treating Provider/Extender: STONE III, Tawney Vanorman [name]: 1 Constitutional Obese and well-hydrated in no acute distress. Respiratory normal breathing without difficulty. clear to auscultation bilaterally. Cardiovascular regular rate and rhythm with normal S1, S2. Psychiatric this patient is able to make decisions and demonstrates good insight into disease process. Alert and Oriented x 3. pleasant and cooperative. Notes His wound bed currently showed signs of good granulation in regard to the left lower extremity he has good epithelization as well and overall seems to be making excellent progress. With regard to the wound on his toe which is the second toe on the right he seems to be doing much better there is fairly good granulation no signs of infection  and at this time the callus which I did remove revealed a really good wound bed underneath overall I am very pleased with how things seem to be progressing. Electronic Signature(s) Signed: 03/25/2019 2:08:01 PM By: Worthy Keeler PA-C Entered By: Worthy Keeler on 03/25/2019 14:08:00 Adam Merritt  ([id number]) -------------------------------------------------------------------------------- Physician Orders Details Patient Name: Adam Merritt Date of Service: 03/25/2019 1:30 PM Medical Record Number: [id number] Patient Account Number: [id number] Date of Birth/Sex: 02/07/1946 (73 y.o. 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 Wound #25 Left,Anterior Lower Leg o May shower with protection. - Please do not get your wraps wet Skin Barriers/Peri-Wound Care Wound #25 Left,Anterior Lower Leg o Moisturizing lotion Primary Wound Dressing Wound #24 Right,Distal Toe Second o Silver Alginate Wound #25 Left,Anterior Lower Leg o Gentamicin Sulfate Cream o Silver Alginate - over gentamicin cream Secondary Dressing Wound #24 Right,Distal Toe Second o Gauze and Kerlix/Conform Wound #25 Left,Anterior Lower Leg o ABD pad Dressing Change Frequency Wound #24 Right,Distal Toe Second o Change dressing every other day. Wound #25 Left,Anterior Lower Leg o Other: - Twice Weekly Adam Merritt, Adam Merritt ([id number]) Follow-up Appointments Wound #24 Right,Distal Toe Second o Return Appointment in 1 week. - Friday 04/04/19 o Nurse Visit as needed - Friday 03/28/19  and Tuesday 04/01/19 Wound #25 Left,Anterior Lower Leg o Return Appointment in 1 week. - Friday 04/04/19 o Nurse Visit as needed - Friday 03/28/19 and Tuesday 04/01/19 Edema Control Wound #24 Right,Distal Toe Second o Patient to wear own Velcro compression garment. o Elevate legs to the level of the heart and pump ankles as often as possible o Compression Pump: Use compression pump on left lower extremity for 60 minutes, twice daily. Wound #25 Left,Anterior Lower Leg o 4-Layer Compression System - Left Lower Extremity. - anchor with unna o Compression Pump: Use compression pump on left lower extremity for 60 minutes, twice daily. o Compression Pump: Use compression pump on right lower extremity for 60 minutes, twice daily. Electronic Signature(s) Signed: 03/25/2019 4:44:40 PM By: Montey Hora Signed: 03/25/2019 7:13:08 PM By: Worthy Keeler PA-C Entered By: Montey Hora on 03/25/2019 14:03:08 Adam Merritt ([id number]) -------------------------------------------------------------------------------- Problem List Details Patient Name: HULBERT, BRANSCOME Date of Service: 03/25/2019 1:30 PM Medical Record Number: [id number] Patient Account Number: [id number] Date of Birth/Sex: 01-13-1946 (73 y.o. M) Treating RN: Montey Hora Primary Care Provider: [name] Other Clinician: Referring Provider: [name] Treating Provider/Extender: Melburn Hake, Iriana Artley [name]: 1 Active Problems ICD-10 Evaluated Encounter Code Description Active Date Today Diagnosis I89.0 Lymphedema, not elsewhere classified 03/18/2019 No Yes E11.621 Type 2 diabetes mellitus with foot ulcer 03/18/2019 No Yes L97.512 Non-pressure chronic ulcer of other part of right foot with fat 03/18/2019 No Yes layer exposed L97.822 Non-pressure chronic ulcer of other part of left lower leg with 03/18/2019 No Yes fat layer exposed E66.09 Other obesity  due to excess calories 03/18/2019 No Yes Inactive  Problems Resolved Problems Electronic Signature(s) Signed: 03/25/2019 1:46:19 PM By: Worthy Keeler PA-C Entered By: Worthy Keeler on 03/25/2019 13:46:19 Adam Merritt ([id number]) -------------------------------------------------------------------------------- Progress Note Details Patient Name: Adam Merritt Date of Service: 03/25/2019 1:30 PM Medical Record Number: [id number] Patient Account Number: [id number] Date of Birth/Sex: 1946/05/29 (73 y.o. M) Treating RN: Montey Hora Primary Care Provider: [name] Other Clinician: Referring Provider: [name] Treating Provider/Extender: Melburn Hake, Janayah Zavada [name]: 1 Subjective Chief Complaint Information obtained from Patient Right toe and left Merritt Ulcer History of Present Illness (HPI) 10/18/17-He is here for initial evaluation of bilateral lower extremity ulcerations in the presence of venous insufficiency and lymphedema. He has been seen by vascular medicine in the past, Dr. Lucky Cowboy, last seen in 2016. He does have a history of abnormal ABIs, which is to be expected given his lymphedema and venous insufficiency. According to Epic, it appears that all attempts for arterial evaluation and/or angiography were not follow through with by patient. He does have a history of being seen in lymphedema clinic in 2018, stopped going approximately 6 months ago stating "it didn't do any good". He does not have lymphedema pumps, he does not have custom fit compression wrap/stockings. He is diabetic and his recent A1c last month was 7.6. He admits to chronic bilateral lower extremity pain, no change in pain since blister and ulceration development. He is currently being treated with Levaquin for bronchitis. He has home health and we will continue. 10/25/17-He is here in follow-up evaluation for bilateral lower extremity ulcerationssubtle he remains on Levaquin for bronchitis. Right lower extremity with no evidence of drainage or  ulceration, persistent left lower extremity ulceration. He states that home health has not been out since his appointment. He went to [facility] on Tuesday, studies revealed: RIGHT ABI 0.9, TBI 0.6 LEFT ABI 1.1, TBI 0.6 with triphasic flow bilaterally. We will continue his same treatment plan. He has been educated on compression therapy and need for elevation. He will benefit from lymphedema pumps 11/01/17-He is here in follow-up evaluation for left lower extremity ulcer. The right lower extremity remains healed. He has home health services, but they have not been out to see the patient for 2-3 weeks. He states it home health physical therapy changed his dressing yesterday after therapy; he placed Ace wrap compression. We are still waiting for lymphedema pumps, reordered d/t need for company change. 11/08/17-He is here in follow-up evaluation for left lower extremity ulcer. It is improved. Edema is significantly improved with compression therapy. We will continue with same treatment plan and he will follow-up next week. No word regarding lymphedema pumps 11/15/17-He is here in follow-up evaluation for left lower extremity ulcer. He is healed and will be discharged from wound care services. I have reached out to [facility] regarding his lymphedema pumps. They have been unable to reach the patient; the contact number they had with the patient's wife's cell phone and she has not answered any unrecognized calls. Contact should be made today, trial planned for next week; [facility] will continue to follow 11/27/17 on evaluation today patient has multiple blistered areas over the right lower extremity his left lower extremity appears to be doing okay. These blistered areas show signs of no infection which is great news. With that being said he did have some necrotic skin overlying which was mechanically debrided away with saline and gauze today without complication. Overall post  debridement the wounds appear to be doing better but in general his swelling seems to be increased. This is obviously not good news. I think this is what has given rise to the blisters. 12/04/17 on evaluation today patient presents for follow-up concerning his bilateral lower extremity edema in the right lower extremity ulcers. He has been tolerating the dressing changes without complication. With that being said he has had no real issues with the wraps which is also good news. Overall I'm pleased with the progress he's been making. 12/11/17 on evaluation today patient appears to be doing rather well in regard to his right lateral lower extremity ulcer. He's been tolerating the dressing changes without complication. Fortunately there does not appear to be any evidence of infection at this time. Overall I'm pleased with the progress that is being made. Unfortunately he has been in the hospital due to having what sounds to be a stomach virus/flu fortunately that is starting to get better. 12/18/17 on evaluation today patient actually appears to be doing very well in regard to his bilateral lower extremities the DWYANE, DUPREE. ([id number]) swelling is under fairly good control his lymphedema pumps are still not up and running quite yet. With that being said he does have several areas of opening noted as far as wounds are concerned mainly over the left lower extremity. With that being said I do believe once he gets lymphedema pumps this would least help you mention some the fluid and preventing this from occurring. Hopefully that will be set up soon sleeves are Artie in place at his home he just waiting for the machine. 12/25/17 on evaluation today patient actually appears to be doing excellent in fact all of his ulcers appear to have resolved his legs appear very well. I do think he needs compression stockings we have discussed this and they are actually going to go to [facility] today to elastic therapy to  get this fitted for him. I think that is definitely a good thing to do. Readmission: 04/09/18 upon evaluation today this patient is seen for readmission due to bilateral lower extremity lymphedema. He has significant swelling of his extremities especially on the left although the right is also swollen he has weeping from both sides. There are no obvious open wounds at this point. Fortunately he has been doing fairly well for quite a bit of time since I last saw him. Nonetheless unfortunately this seems to have reopened and is giving quite a bit of trouble. He states this began about a week ago when he first called [place] to get in to be seen. No fevers, chills, nausea, or vomiting noted at this time. He has not been using his lymphedema pumps due to the fact that they won't fit on his leg at this point likewise is also not been using his compression for essentially the same reason. 04/16/18 upon evaluation today patient actually appears to be doing a little better in regard to the fluid in his bilateral lower extremities. With that being said he's had three falls since I saw him last week. He also states that he's been feeling very poorly. I was concerned last week and feel like that the concern is still there as far as the congestion in his chest is concerned he seems to be breathing about the same as last week but again he states he's very weak he's not even able to walk further than from the chair to the door. His wife had to buy a wheelchair  just to be able to get them out of the house to get to the appointment today. This has me very concerned. 04/23/18 on evaluation today patient actually appears to be doing much better than last week's evaluation. At that time actually had to transport him to the ER via EMS and he subsequently was admitted for acute pulmonary edema, acute renal failure, and acute congestive heart failure. Fortunately he is doing much better. Apparently they did dialyze him and  were able to take off roughly 35 pounds of fluid. Nonetheless he is feeling much better both in regard to his breathing and he's able to get around much better at this time compared to previous. Overall I'm very happy with how things are at this time. There does not appear to be any evidence of infection currently. No fevers, chills, nausea, or vomiting noted at this time. 04/30/2018 patient seen today for follow-up and management of bilateral lower extremity lymphedema. He did express being more sad today than usual due to the recent loss of his dog. He states that he has been compliant with using the lymphedema pumps. However he does admit a minute over the last 2-3 days he has not been using the pumps due to the recent loss of his dog. At this time there is no drainage or open wounds to his lower extremities. The left leg edema is measuring smaller today. Still has a significant amount of edema on bilateral lower extremities With dry flaky skin. He will be referred to the lymphedema clinic for further management. Will continue 3 layer compression wraps and follow-up in 1-2 weeks.Denies any pain, fever, chairs recently. No recent falls or injuries reported during this visit. 05/07/18 on evaluation today patient actually appears to be doing very well in regard to his lower extremities in general all things considered. With that being said he is having some pain in the legs just due to the amount of swelling. He does have an area where he had a blister on the left lateral lower extremity this is open at this point other than that there's nothing else weeping at this time. 05/14/18 on evaluation today patient actually appears to be doing excellent all things considered in regard to his lower extremities. He still has a couple areas of weeping on each leg which has continued to be the issue for him. He does have an appointment with the lymphedema clinic although this isn't until February 2020. That was  the earliest they had. In the meantime he has continued to tolerate the compression wraps without complication. 05/28/18 on evaluation today patient actually appears to be doing more poorly in regard to his left lower extremity where he has a wound open at this point. He also had a fall where he subsequently injured his right great toe which has led to an open wound at the site unfortunately. He has been tolerating the dressing changes without complication in general as far as the wraps are concerned that he has not been putting any dressing on the left 1st toe ulcer site. 06/11/18 on evaluation today patient appears to be doing much worse in regard to his bilateral lower extremity ulcers. He has been tolerating the dressing changes without complication although his legs have not been wrapped more recently. Overall I am not very pleased with the way his legs appear. I do believe he needs to be back in compression wraps he still has not received his compression wraps from the [facility] hospital as of yet. Adam Merritt,  Adam Merritt ([id number]) 06/18/18 on evaluation today patient actually appears to be doing significantly better than last time I saw him. He has been tolerating the compression wraps without complication in the circumferential ulcers especially appear to be doing much better. His toe ulcer on the right in regard to the great toe is better although not as good as the legs in my pinion. No fevers chills noted 07/02/18 on evaluation today patient appears to be doing much better in regard to his lower extremity ulcers. Unfortunately since I last saw him he's had the distal portion of his right great toe if he dated it sounds as if this actually went downhill very quickly. I had only seen him a few days prior and the toe did not appear to be infected at that point subsequently became infected very rapidly and it was decided by the surgeon that the distal portion of the toe needed to be removed. The  patient seems to be doing well in this regard he tells me. With that being said his lower extremities are doing better from the standpoint of the wounds although he is significantly swollen at this point. 07/09/18 on evaluation today patient appears to be doing better in regard to the wounds on his lower extremities. In fact everything is almost completely healed he is just a small area on the left posterior lower extremity that is open at this point. 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 With that being said he needs some kind of compression it does sound like he may not be wearing his compression, that is the wraps, during the entire time between when he's here visit to visit. Apparently his wife took the current one off because it began to "fall apart". 07/16/18 on evaluation today patient appears to be extremely swollen especially in regard to his left lower extremity unfortunately. He also has a new skin tear over the left lower extremity and there's a smaller area on the right lower extremity as well. Unfortunately this seems to be due in part to blistering and fluid buildup in his leg. He did get the reduction wraps that were ordered by the [facility] hospital for him to go to lymphedema clinic. With that being said his wounds on the legs have not healed to the point to where they would likely accept them as a patient lymphedema clinic currently. We need to try to get this to heal. With that being said he's been taking his wraps off which is not doing him any favors at this point. In fact this is probably quite counterproductive compared to what needs to occur. We will likely need to increase to a four layer compression wrap and continue to also utilize elevation and he has to keep the wraps on not take them off as  he's been doing currently hasn't had a wrap on since Saturday. 07/23/18 on evaluation today patient appears to be doing much better in regard to his bilateral lower extremities. In fact his left lower extremity which was the largest is actually 15 cm smaller today compared to what it was last time he was here in our clinic. This is obviously good news after just one week. Nonetheless the differences he actually kept the wraps on during the entire week this time. That's not typical for him. I do believe  he understands a little bit better now the severity of the situation and why it's important for him to keep these wraps on. 07/30/18 on evaluation today patient actually appears to be doing rather well in regard to his lower extremities. His legs are much smaller than they have been in the past and he actually has only one very small rudder superficial region remaining that is not closed on the left lateral/posterior lower extremity even this is almost completely close. I do believe likely next week he will be healed without any complications. I do think we need to continue the wraps however this seems to be beneficial for him. I also think it may be a good time for [place] to go ahead and see about getting the appointment with the lymphedema clinic which is supposed to be made for him in order to keep this moving along and hopefully get them into compression wraps that will in the end help him to remain healed. 08/06/18 on evaluation today patient actually appears to be doing very well in regard as bilateral lower extremities. In fact his wounds appear to be completely healed at this time. 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 He is in the process of getting appointment with the lymphedema clinic we have made this referral were just waiting to hear back on the schedule time. We need to follow up on that today as well. 08/13/18 on evaluation  today patient actually appears to be doing very well in regard to his bilateral lower extremities there are no open wounds at this point. We are gonna go ahead and see about ordering the Velcro compression wraps for him had a discussion with them about [place] doing it versus the [place] they feel like they can definitely afford going ahead and get the wraps themselves and they would prefer to try to avoid having to go to the lymphedema clinic if it all possible which I completely understand. As long as he has good compression I'm okay either way. 3/17//20 on evaluation today patient actually appears to be doing well in regard to his bilateral lower extremity ulcers. He has been tolerating the dressing changes without complication specifically the compression wraps. Overall is had no issues my fingers finding that I see at this point is that he is having trouble with constipation. He tells me he has not been able to go to the bathroom for about six days. He's taken to over-the-counter oral laxatives unfortunately this is not helping. He has not contacted his doctor. Adam Merritt, Adam Merritt ([id number]) 08/27/18 on evaluation today patient appears to be doing fairly well in regard to his lower extremities at this point. There does not appear to be any new altars is swelling is very well controlled. We are still waiting for his Velcro compression wraps to arrive that should be sometime in the next week is Artie sent the check for these. 09/03/18 on evaluation today patient appears to be doing excellent in regard to his bilateral lower extremities which he shows no signs of wound openings in regard to this point. He does have his Velcro compression wraps which did arrive in the mail since I saw him last week. Overall he is doing excellent in my pinion. 09/13/18 on evaluation today patient appears to be doing very well currently in regard to the overall appearance of his bilateral lower extremities although he's a little  bit more swollen than last time we saw him. At  that point he been discharged without any open wounds. Nonetheless he has a small open wound on the posterior left lower extremity with some evidence of cellulitis noted as well. Fortunately I feel like he has made good progress overall with regard to his lower extremities from were things used to be. 09/20/18 on evaluation today patient actually appears to be doing much better. The erythematous lower extremity is improving wound itself which is still open appears to be doing much better as far as but appearance as well as pain is concerned overall very pleased in this regard. There's no signs of active infection at this time. 09/27/18 on evaluation today patient's wounds on the lower extremity actually appear to be doing fairly well at this time which is good news. There is no evidence of active infection currently and again is just as left lower extremity were there any wounds at this point anyway. I believe they may be completely healed but again I'm not 100% sure based on evaluation today. I think one more week of observation would likely be a good idea. 10/04/18 on evaluation today patient actually appears to be doing excellent in regard to the left lower extremity which actually appears to be completely healed as of today. Unfortunately he's been having issues with his right lower extremity have a new wound that has opened. Fortunately there's no evidence of active infection at this time which is good news. 10/10/18 upon evaluation today patient actually appears to be doing a little bit worse with the new open area on his right posterior lower extremity. He's been tolerating the dressing changes without complication. Right now we been using his compression wraps although I think we may need to switch back to actually performing bilateral compression wraps are in the clinic. No fevers, chills, nausea, or vomiting noted at this time. 10/17/18 on  evaluation today patient actually appears to be doing quite well in regard to his bilateral lower extremity ulcers. He has been tolerating the reps without complication although he would prefer not to rewrap his legs as of today. Fortunately there's no signs of active infection which is good news. No fevers, chills, nausea, or vomiting noted at this time. 10/24/18 on evaluation today patient appears to be doing very well in regard to his lower extremities. His right lower extremity is shown signs of healing and his left lower Trinity though not healed appears to be improving which is excellent news. Overall very pleased with how things seem to be progressing at this time. The patient is likewise happy to hear this. His Velcro compression wraps however have not been put on properly will gonna show his wife how to do that properly today. 10/31/18 on evaluation today patient appears to be doing more poorly in regard to his left lower extremity in particular although both lower extremities actually are showing some signs of being worse than my previous evaluation. Unfortunately I'm just not sure that his compression stockings even with the use of the compression/lymphedema pumps seem to be controlling this well. Upon further questioning he tells me that he also is not able to lie flat in the bed due to his congestive heart failure and difficulty breathing. For that reason he sleeps in his recliner. To make matters worse his recliner also cannot even hold his legs up so instead of even being somewhat elevated they pretty much hang down to the floor. This is the way he sleeps each night which is definitely counterproductive to everything else that  were attempting to do from the standpoint of controlling his fluid. Nonetheless I think that he potentially could benefit from a hospital bed although this would be something that his primary care provider would likely have to order since anything that is order on  our side has to be directly related to wound care and again the hospital bed is not necessarily a direct relation to although I think it does contribute to his overall wound status on his lower extremities. 11/07/18 on evaluation today patient appears to be doing better in regard to his bilateral lower extremity. He's been tolerating the dressing changes without complication. We did in the interim since I last saw him switch to just using extras orbiting new alginate and that seems to have done much better for him. I'm very pleased with the overall progress that is made. 11/14/18 on evaluation today patient appears to be redoing rather well in regard to his left lower extremity ulcers which are the only ones remaining at this point. Fortunately there's no signs of active infection at this time which is good news. Overall been Adam Merritt, Adam Merritt ([id number]) very pleased with how things seem to be progressing currently. No fevers, chills, nausea, or vomiting noted at this time. 11/20/17 on evaluation today patient actually appears to be doing quite well in regard to his lower extremities on the left on the right he has several blisters that showed up although there's some question about whether or not he has had his broker compression wraps on like he was supposed to or not. Fortunately there's no signs of active infection at this time which is good news. Unfortunately though he is doing well from the standpoint of the left leg the right leg is not doing as well again this is when we did not wrap last week. 11/28/18 on evaluation today patient appears to be doing well in regard to his bilateral lower extremities is left appears to be healed is right is not healed but is very close to being so. Overall very pleased with how things seem to be progressing. Patient is likewise happy that things are doing well. 12/09/18 on evaluation today patient actually appears to be completely healed which is excellent news. He  actually seems to be doing well in regard to the swelling of the bilateral lower extremities which is also great news. Overall very pleased with how things seem to be progressing. Readmission: 03/18/2019 upon evaluation today patient presents for reevaluation concerning issues that he is having with his left lower extremity where he does have a wound noted upon inspection today. He also has a wound on the right second toe on the tip where it is apparently been rubbing in his shoe I did look at issues and you can actually see where this has been occurring as well. Fortunately there is no signs of infection with regard to the toe necessarily although it is very swollen compared to normal that does have me somewhat concerned about the possibility of further evaluation for infection/osteomyelitis. I would recommend an x-ray to start with. Otherwise he states that it is been 1-2 weeks that he has had the draining in regard to left lower extremity he does not know how this happened he has been wearing his compression appropriately and I do see that as well today but nonetheless I do think that he needs to continue to be very cautious with regard to elevation as well. 03/25/2019 on evaluation today patient appears to be doing much better  in regard to his lower extremity at this time. That is on the left. He did culture positive for Pseudomonas but the good news is he seems to be doing much better the gentamicin we applied topically seems to have done a great job for him. There is no signs of active infection at this time systemically and locally he is doing much better. No fevers, chills, nausea, vomiting, or diarrhea. In regard to his right toe this seems to be doing much better there is very little pressure noted at this point I did clean up some of the callus today around the edges of the wound as well as the surface of the wound but to be honest he is progressing quite nicely. Patient  History Information obtained from Patient. Family History Cancer - Paternal Grandparents, Kidney Disease - Siblings, Lung Disease - Mother, No family history of Diabetes, Heart Disease, Hereditary Spherocytosis, Hypertension, Seizures, Stroke, Thyroid Problems, Tuberculosis. Social History Never smoker, Marital Status - Married, Alcohol Use - Never, Drug Use - No History, Caffeine Use - Never. Medical History Eyes Denies history of Cataracts, Glaucoma, Optic Neuritis Ear/Nose/Mouth/Throat Denies history of Chronic sinus problems/congestion, Middle ear problems Hematologic/Lymphatic Patient has history of Anemia - aplastic anemia, Lymphedema Denies history of Hemophilia, Human Immunodeficiency Virus, Sickle Cell Disease Respiratory Patient has history of Chronic Obstructive Pulmonary Disease (COPD), Sleep Apnea Denies history of Aspiration, Asthma, Pneumothorax, Tuberculosis Cardiovascular Patient has history of Congestive Heart Failure, Coronary Artery Disease, Hypertension, Peripheral Venous Disease Denies history of Angina, Arrhythmia, Deep Vein Thrombosis, Hypotension, Myocardial Infarction, Peripheral Arterial Disease, Adam Merritt, Adam Merritt ([id number]) Phlebitis, Vasculitis Gastrointestinal Denies history of Cirrhosis , Colitis, Crohn s, Hepatitis A, Hepatitis B, Hepatitis C Endocrine Patient has history of Type II Diabetes Denies history of Type I Diabetes Genitourinary Denies history of End Stage Renal Disease Immunological Denies history of Lupus Erythematosus, Raynaud s, Scleroderma Integumentary (Skin) Denies history of History of Burn, History of pressure wounds Musculoskeletal Patient has history of Osteoarthritis Denies history of Gout, Rheumatoid Arthritis, Osteomyelitis Neurologic Patient has history of Neuropathy Denies history of Dementia, Quadriplegia, Paraplegia, Seizure Disorder Oncologic Patient has history of Received Chemotherapy - last dose 2006 Denies  history of Received Radiation Psychiatric Denies history of Anorexia/bulimia, Confinement Anxiety Hospitalization/Surgery History - bronchitis. Medical And Surgical History Notes Respiratory home O2 at night as needed Cardiovascular varicose veins Neurologic CVA - 1992 Review of Systems (ROS) Constitutional Symptoms (General Health) Denies complaints or symptoms of Fatigue, Fever, Chills, Marked Weight Change. Respiratory Denies complaints or symptoms of Chronic or frequent coughs, Shortness of Breath. Cardiovascular Denies complaints or symptoms of Chest pain, Merritt edema. Psychiatric Denies complaints or symptoms of Anxiety, Claustrophobia. Objective Constitutional Obese and well-hydrated in no acute distress. Vitals Time Taken: 1:28 PM, Height: 66 in, Weight: 297 lbs, BMI: 47.9, Temperature: 98.4 F, Pulse: 70 bpm, Respiratory Rate: 16 breaths/min, Blood Pressure: 154/74 mmHg. Adam Merritt, Adam Merritt ([id number]) Respiratory normal breathing without difficulty. clear to auscultation bilaterally. Cardiovascular regular rate and rhythm with normal S1, S2. Psychiatric this patient is able to make decisions and demonstrates good insight into disease process. Alert and Oriented x 3. pleasant and cooperative. General Notes: His wound bed currently showed signs of good granulation in regard to the left lower extremity he has good epithelization as well and overall seems to be making excellent progress. With regard to the wound on his toe which is the second toe on the right he seems to be doing much better there is fairly good granulation no  signs of infection and at this time the callus which I did remove revealed a really good wound bed underneath overall I am very pleased with how things seem to be progressing. Integumentary (Hair, Skin) Wound #24 status is Open. Original cause of wound was Gradually Appeared. The wound is located on the Right,Distal Toe Second. The wound measures 0.9cm  length x 0.6cm width x 0.3cm depth; 0.424cm^2 area and 0.127cm^3 volume. There is Fat Layer (Subcutaneous Tissue) Exposed exposed. There is no tunneling or undermining noted. There is a small amount of serous drainage noted. The wound margin is flat and intact. There is small (1-33%) red granulation within the wound bed. There is a large (67-100%) amount of necrotic tissue within the wound bed including Adherent Slough. Wound #25 status is Open. Original cause of wound was Gradually Appeared. The wound is located on the Left,Anterior Lower Leg. The wound measures 1.2cm length x 7cm width x 0.1cm depth; 6.597cm^2 area and 0.66cm^3 volume. There is Fat Layer (Subcutaneous Tissue) Exposed exposed. There is no tunneling or undermining noted. There is a large amount of purulent drainage noted. The wound margin is flat and intact. There is small (1-33%) pink granulation within the wound bed. There is a large (67-100%) amount of necrotic tissue within the wound bed including Adherent Slough. 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 There was a Excisional Skin/Subcutaneous Tissue Debridement with a total area of 0.54 sq cm performed by STONE III, Cheskel Silverio E., PA-C. With the following instrument(s): Curette to remove Viable and Non-Viable tissue/material. Material removed includes Callus, Subcutaneous Tissue, and Adam Merritt, Adam Merritt ([id number]) Story after achieving pain control using Lidocaine 4% Topical Solution. No specimens were taken. A  time out was conducted at 13:58, prior to the start of the procedure. A Minimum amount of bleeding was controlled with Pressure. The procedure was tolerated well with a pain level of 0 throughout and a pain level of 0 following the procedure. Post Debridement Measurements: 0.9cm length x 0.6cm width x 0.1cm depth; 0.042cm^3 volume. Character of Wound/Ulcer Post Debridement is improved. Severity of Tissue Post Debridement is: Fat layer exposed. Post procedure Diagnosis Wound #24: Same as Pre-Procedure Wound #25 Pre-procedure diagnosis of Wound #25 is a Diabetic Wound/Ulcer of the Lower Extremity located on the Left,Anterior Lower Leg . There was a Four Layer Compression Therapy Procedure with a pre-treatment ABI of 1 by Montey Hora, RN. Post procedure Diagnosis Wound #25: Same as Pre-Procedure Plan Wound Cleansing: Wound #24 Right,Distal Toe Second: Dial antibacterial soap, wash wounds, rinse and pat dry prior to dressing wounds May Shower, gently pat wound dry prior to applying new dressing. Wound #25 Left,Anterior Lower Leg: May shower with protection. - Please do not get your wraps wet Skin Barriers/Peri-Wound Care: Wound #25 Left,Anterior Lower Leg: Moisturizing lotion Primary Wound Dressing: Wound #24 Right,Distal Toe Second: Silver Alginate Wound #25 Left,Anterior Lower Leg: Gentamicin Sulfate Cream Silver Alginate - over gentamicin cream Secondary Dressing: Wound #24 Right,Distal Toe Second: Gauze and Kerlix/Conform Wound #25 Left,Anterior Lower Leg: ABD pad Dressing Change Frequency: Wound #24 Right,Distal Toe Second: Change  dressing every other day. Wound #25 Left,Anterior Lower Leg: Other: - Twice Weekly Follow-up Appointments: Wound #24 Right,Distal Toe Second: Return Appointment in 1 week. - Friday 04/04/19 Nurse Visit as needed - Friday 03/28/19 and Tuesday 04/01/19 Wound #25 Left,Anterior Lower Leg: Return Appointment in 1 week. - Friday 04/04/19 Nurse Visit  as needed - Friday 03/28/19 and Tuesday 04/01/19 Edema Control: Wound #24 Right,Distal Toe Second: Patient to wear own Velcro compression garment. Elevate legs to the level of the heart and pump ankles as often as possible Compression Pump: Use compression pump on left lower extremity for 60 minutes, twice daily. Wound #25 Left,Anterior Lower Leg: 4-Layer Compression System - Left Lower Extremity. - anchor with ANTERO, DEROSIA ([id number]) Compression Pump: Use compression pump on left lower extremity for 60 minutes, twice daily. Compression Pump: Use compression pump on right lower extremity for 60 minutes, twice daily. 1 I am in a recommend currently that we go ahead and continue with the silver alginate dressing to both wound beds he seems to be doing very well at this time. 2. He did culture positive for Pseudomonas I am recommending topical gentamicin which we will apply when he is here for the dressing changes here in the office this is to the left lower extremity wound. Overall I think that that has treated the infection quite nicely at this point. 3. I do recommend the patient continue with his compression/lymphedema pumps as that is helpful for him 4. I do recommend that we continue to wrap his left lower extremity although he will continue to use his Velcro compression wrap for the right lower extremity. We will see patient back for reevaluation in 1 week here in the clinic. If anything worsens or changes patient will contact our office for additional recommendations. Electronic Signature(s) Signed: 03/25/2019 2:08:42 PM By: Worthy Keeler PA-C Entered By: Worthy Keeler on 03/25/2019 14:08:41 Adam Merritt ([id number]) -------------------------------------------------------------------------------- ROS/PFSH Details Patient Name: Adam Merritt Date of Service: 03/25/2019 1:30 PM Medical Record Number: [id number] Patient Account Number: [id number] Date of Birth/Sex:  04-Aug-1945 (73 y.o. M) Treating RN: Montey Hora Primary Care Provider: [name] Other Clinician: Referring Provider: [name] Treating Provider/Extender: Melburn Hake, Chasity Outten [name]: 1 Information Obtained From Patient Constitutional Symptoms (General Health) Complaints and Symptoms: Negative for: Fatigue; Fever; Chills; Marked Weight Change Respiratory Complaints and Symptoms: Negative for: Chronic or frequent coughs; Shortness of Breath Medical History: Positive for: Chronic Obstructive Pulmonary Disease (COPD); Sleep Apnea Negative for: Aspiration; Asthma; Pneumothorax; Tuberculosis Past Medical History Notes: home O2 at night as needed Cardiovascular Complaints and Symptoms: Negative for: Chest pain; Merritt edema Medical History: Positive for: Congestive Heart Failure; Coronary Artery Disease; Hypertension; Peripheral Venous Disease Negative for: Angina; Arrhythmia; Deep Vein Thrombosis; Hypotension; Myocardial Infarction; Peripheral Arterial Disease; Phlebitis; Vasculitis Past Medical History Notes: varicose veins Psychiatric Complaints and Symptoms: Negative for: Anxiety; Claustrophobia Medical History: Negative for: Anorexia/bulimia; Confinement Anxiety Eyes Medical History: Negative for: Cataracts; Glaucoma; Optic Neuritis Ear/Nose/Mouth/Throat Medical History: Negative for: Chronic sinus problems/congestion; Middle ear problems Hematologic/Lymphatic ELBIE, STATZER ([id number]) Medical History: Positive for: Anemia - aplastic anemia; Lymphedema Negative for: Hemophilia; Human Immunodeficiency Virus; Sickle Cell Disease Gastrointestinal Medical History: Negative for: Cirrhosis ; Colitis; Crohnos; Hepatitis A; Hepatitis B; Hepatitis C Endocrine Medical History: Positive for: Type II Diabetes Negative for: Type I Diabetes Time with diabetes: since 2006 Treated with: Insulin Blood sugar tested every day: Yes Tested : Blood sugar testing  results: Breakfast: 209 Genitourinary Medical History:  Negative for: End Stage Renal Disease Immunological Medical History: Negative for: Lupus Erythematosus; Raynaudos; Scleroderma Integumentary (Skin) Medical History: Negative for: History of Burn; History of pressure wounds Musculoskeletal Medical History: Positive for: Osteoarthritis Negative for: Gout; Rheumatoid Arthritis; Osteomyelitis Neurologic Medical History: Positive for: Neuropathy Negative for: Dementia; Quadriplegia; Paraplegia; Seizure Disorder Past Medical History Notes: CVA - 1992 Oncologic Medical History: Positive for: Received Chemotherapy - last dose 2006 Negative for: Received Radiation Immunizations Pneumococcal Vaccine: Received Pneumococcal Vaccination: Yes KELVEN, FLATER ([id number]) Implantable Devices No devices added Hospitalization / Surgery History Type of Hospitalization/Surgery bronchitis Family and Social History Cancer: Yes - Paternal Grandparents; Diabetes: No; Heart Disease: No; Hereditary Spherocytosis: No; Hypertension: No; Kidney Disease: Yes - Siblings; Lung Disease: Yes - Mother; Seizures: No; Stroke: No; Thyroid Problems: No; Tuberculosis: No; Never smoker; Marital Status - Married; Alcohol Use: Never; Drug Use: No History; Caffeine Use: Never; Financial Concerns: No; Food, Clothing or Shelter Needs: No; Support System Lacking: No; Transportation Concerns: No Physician Affirmation I have reviewed and agree with the above information. Electronic Signature(s) Signed: 03/25/2019 4:44:40 PM By: Montey Hora Signed: 03/25/2019 7:13:08 PM By: Worthy Keeler PA-C Entered By: Worthy Keeler on 03/25/2019 14:07:43 Adam Merritt ([id number]) -------------------------------------------------------------------------------- SuperBill Details Patient Name: Adam Merritt Date of Service: 03/25/2019 Medical Record Number: [id number] Patient Account Number: [id number] Date of  Birth/Sex: 1945/11/21 (73 y.o. M) Treating RN: Montey Hora Primary Care Provider: [name] Other Clinician: Referring Provider: [name] Treating Provider/Extender: Melburn Hake, Kristal Perl [name]: 1 Diagnosis Coding ICD-10 Codes Code Description I89.0 Lymphedema, not elsewhere classified E11.621 Type 2 diabetes mellitus with foot ulcer L97.512 Non-pressure chronic ulcer of other part of right foot with fat layer exposed L97.822 Non-pressure chronic ulcer of other part of left lower leg with fat layer exposed E66.09 Other obesity due to excess calories Facility Procedures CPT4 Code: 08657846 Description: 11042 - DEB SUBQ TISSUE 20 SQ CM/< ICD-10 Diagnosis Description L97.512 Non-pressure chronic ulcer of other part of right foot with fat Modifier: layer exposed Quantity: 1 Physician Procedures CPT4 Code Description: 9629528 41324 - WC PHYS LEVEL 4 - EST PT ICD-10 Diagnosis Description I89.0 Lymphedema, not elsewhere classified 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

## 2019-03-26 NOTE — Progress Notes (Signed)
RASHAUD, YBARBO ([id number]) Visit Report for 03/25/2019 Arrival Information Details Patient Name: Adam Merritt, Adam Merritt Date of Service: 03/25/2019 1:30 PM Medical Record Number: [id number] Patient Account Number: [id number] Date of Birth/Sex: 05-21-1946 (73 y.o. M) Treating RN: Montey Hora Primary Care Carianna Lague: [name] Other Clinician: Referring Aundria Bitterman: [name] Treating Lema Heinkel/Extender: Melburn Hake, [name]: 1 Visit Information History Since Last Visit Added or deleted any medications: No Patient Arrived: Walker Any new allergies or adverse reactions: No Arrival Time: 13:27 Had a fall or experienced change in No Accompanied By: wife activities of daily living that may affect Transfer Assistance: None risk of falls: Patient Identification Verified: Yes Signs or symptoms of abuse/neglect since last visito No Secondary Verification Process Yes Hospitalized since last visit: No Completed: Implantable device outside of the clinic excluding No Patient Has Alerts: Yes cellular tissue based products placed in the center Patient Alerts: Patient on Blood since last visit: Thinner Has Dressing in Place as Prescribed: Yes DMII Pain Present Now: No aspirin 325 ABI 10/22/17 L 1.15 R .98 TBI L .66 R .61 Electronic Signature(s) Signed: 03/25/2019 2:42:16 PM By: Lorine Bears RCP, RRT, CHT Entered By: Lorine Bears on 03/25/2019 13:28:29 Adam Merritt ([id number]) -------------------------------------------------------------------------------- Compression Therapy Details Patient Name: Adam Merritt Date of Service: 03/25/2019 1:30 PM Medical Record Number: [id number] Patient Account Number: [id number] Date of Birth/Sex: 1945-08-24 (73 y.o. M) Treating RN: Montey Hora Primary Care Rube Sanchez: [name] Other Clinician: Referring Klaus Casteneda: [name] Treating Glynda Soliday/Extender: Melburn Hake, [name]:  1 Compression Therapy Performed for Wound Assessment: Wound #25 Left,Anterior Lower Leg Performed By: Clinician Montey Hora, RN Compression Type: Four Layer Pre Treatment ABI: 1 Post Procedure Diagnosis Same as Pre-procedure Electronic Signature(s) Signed: 03/25/2019 4:44:40 PM By: Montey Hora Entered By: Montey Hora on 03/25/2019 14:03:44 Adam Merritt ([id number]) -------------------------------------------------------------------------------- Encounter Discharge Information Details Patient Name: Adam Merritt Date of Service: 03/25/2019 1:30 PM Medical Record Number: [id number] Patient Account Number: [id number] Date of Birth/Sex: 07/23/1945 (73 y.o. M) Treating RN: Montey Hora Primary Care Matson Welch: [name] Other Clinician: Referring Maikel Neisler: [name] Treating Lenard Kampf/Extender: Melburn Hake, [name]: 1 Encounter Discharge Information Items Post Procedure Vitals Discharge Condition: Stable Temperature (F): 98.4 Ambulatory Status: Cane Pulse (bpm): 70 Discharge Destination: Home Respiratory Rate (breaths/min): 16 Transportation: Private Auto Blood Pressure (mmHg): 154/74 Accompanied By: wife Schedule Follow-up Appointment: Yes Clinical Summary of Care: Electronic Signature(s) Signed: 03/25/2019 4:44:40 PM By: Montey Hora Entered By: Montey Hora on 03/25/2019 14:12:39 Adam Merritt ([id number]) -------------------------------------------------------------------------------- Lower Extremity Assessment Details Patient Name: Adam Merritt Date of Service: 03/25/2019 1:30 PM Medical Record Number: [id number] Patient Account Number: [id number] Date of Birth/Sex: 1946-04-16 (73 y.o. M) Treating RN: Army Melia Primary Care Adarrius Graeff: [name] Other Clinician: Referring Jimie Kuwahara: [name] Treating Chrissy Ealey/Extender: Melburn Hake, [name]: 1 Edema Assessment Assessed: [Left: No] [Right: No] Edema: [Left: Ye]  [Right: s] Calf Left: Right: Point of Measurement: 34 cm From Medial Instep 51 cm cm Ankle Left: Right: Point of Measurement: 12 cm From Medial Instep 35 cm cm Vascular Assessment Pulses: Dorsalis Pedis Palpable: [Left:Yes] Electronic Signature(s) Signed: 03/26/2019 8:00:28 AM By: Army Melia Entered By: Army Melia on 03/25/2019 13:37:56 Adam Merritt ([id number]) -------------------------------------------------------------------------------- Multi Wound Chart Details Patient Name: Adam Merritt Date of Service: 03/25/2019 1:30 PM Medical Record Number: [id number] Patient Account Number: [id number] Date of Birth/Sex: [birth year] (73 y.o. M) Treating RN: Montey Hora Primary Care Jerriann Schrom: [name]  Other Clinician: Referring Alecia Doi: [name] Treating Charnele Semple/Extender: STONE III, [name]: 1 Vital Signs Height(in): 66 Pulse(bpm): 70 Weight(lbs): 297 Blood Pressure(mmHg): 154/74 Body Mass Index(BMI): 48 Temperature(F): 98.4 Respiratory Rate 16 (breaths/min): Photos: [N/A:N/A] Wound Location: Right Toe Second - Distal Left Lower Leg - Anterior N/A Wounding Event: Gradually Appeared Gradually Appeared N/A Primary Etiology: Diabetic Wound/Ulcer of the Diabetic Wound/Ulcer of the N/A Lower Extremity Lower Extremity Comorbid History: Anemia, Lymphedema, Anemia, Lymphedema, N/A Chronic Obstructive Chronic Obstructive Pulmonary Disease (COPD), Pulmonary Disease (COPD), Sleep Apnea, Congestive Sleep Apnea, Congestive Heart Failure, Coronary Artery Heart Failure, Coronary Artery Disease, Hypertension, Disease, Hypertension, Peripheral Venous Disease, Peripheral Venous Disease, Type II Diabetes, Type II Diabetes, Osteoarthritis, Neuropathy, Osteoarthritis, Neuropathy, Received Chemotherapy Received Chemotherapy Date Acquired: 03/08/2019 03/08/2019 N/A Weeks of Treatment: 1 1 N/A Wound Status: Open Open N/A Measurements L x W x D 0.9x0.6x0.3  1.2x7x0.1 N/A (cm) Area (cm) : 0.424 6.597 N/A Volume (cm) : 0.127 0.66 N/A % Reduction in Area: 45.50% 37.80% N/A % Reduction in Volume: -62.80% 37.70% N/A Classification: Grade 2 Grade 2 N/A Exudate Amount: Small Large N/A Exudate Type: Serous Purulent N/A Exudate Color: amber yellow, brown, green N/A Wound Margin: Flat and Intact Flat and Intact N/A Granulation Amount: Small (1-33%) Small (1-33%) N/A Adam Merritt, Adam Merritt ([id number]) Granulation Quality: Red Pink N/A Necrotic Amount: Large (67-100%) Large (67-100%) N/A Exposed Structures: Fat Layer (Subcutaneous Fat Layer (Subcutaneous N/A Tissue) Exposed: Yes Tissue) Exposed: Yes Fascia: No Fascia: No Tendon: No Tendon: No Muscle: No Muscle: No Joint: No Joint: No Bone: No Bone: No Epithelialization: Large (67-100%) None N/A Treatment Notes Electronic Signature(s) Signed: 03/25/2019 4:44:40 PM By: Montey Hora Entered By: Montey Hora on 03/25/2019 13:56:16 Adam Merritt ([id number]) -------------------------------------------------------------------------------- [place] Details Patient Name: Adam Merritt Date of Service: 03/25/2019 1:30 PM Medical Record Number: [id number] Patient Account Number: [id number] Date of Birth/Sex: [birth year] (73 y.o. M) Treating RN: Montey Hora Primary Care Rella Egelston: [name] Other Clinician: Referring Johnnetta Holstine: [name] Treating Ronen Bromwell/Extender: Melburn Hake, [name]: 1 Active Inactive Abuse / Safety / Falls / Self Care Management Nursing Diagnoses: History of Falls Goals: Patient will remain injury free related to falls Date Initiated: 03/18/2019 Target Resolution Date: 06/14/2019 Goal Status: Active Interventions: Assess fall risk on admission and as needed Notes: Orientation to the Wound Care Program Nursing Diagnoses: Knowledge deficit related to the wound healing center program Goals: Patient/caregiver will verbalize  understanding of the [facility] Program Date Initiated: 03/18/2019 Target Resolution Date: 06/14/2019 Goal Status: Active Interventions: Provide education on orientation to the wound center Notes: Pressure Nursing Diagnoses: Knowledge deficit related to management of pressures ulcers Goals: Patient will remain free of pressure ulcers Date Initiated: 03/18/2019 Target Resolution Date: 06/14/2019 Goal Status: Active Interventions: Assess: immobility, friction, shearing, incontinence upon admission and as needed Adam Merritt, Adam Merritt ([id number]) Notes: Venous Leg Ulcer Nursing Diagnoses: Actual venous Insuffiency (use after diagnosis is confirmed) Goals: Patient will maintain optimal edema control Date Initiated: 03/18/2019 Target Resolution Date: 06/14/2019 Goal Status: Active Interventions: Assess peripheral edema status every visit. Compression as ordered Notes: Wound/Skin Impairment Nursing Diagnoses: Impaired tissue integrity Goals: Ulcer/skin breakdown will heal within 14 weeks Date Initiated: 03/18/2019 Target Resolution Date: 06/14/2019 Goal Status: Active Interventions: Assess patient/caregiver ability to obtain necessary supplies Assess patient/caregiver ability to perform ulcer/skin care regimen upon admission and as needed Assess ulceration(s) every visit Notes: Electronic Signature(s) Signed: 03/25/2019 4:44:40 PM By: Montey Hora Entered By: Montey Hora on 03/25/2019 13:56:06  Adam Merritt, Adam Merritt ([id number]) -------------------------------------------------------------------------------- Pain Assessment Details Patient Name: Adam Merritt, Adam Merritt Date of Service: 03/25/2019 1:30 PM Medical Record Number: [id number] Patient Account Number: [id number] Date of Birth/Sex: 11-Jul-1945 (73 y.o. M) Treating RN: Montey Hora Primary Care Maiah Sinning: [name] Other Clinician: Referring Lurlean Kernen: [name] Treating Eustace Hur/Extender: Melburn Hake, [name] in  Treatment: 1 Active Problems Location of Pain Severity and Description of Pain Patient Has Paino No Site Locations Pain Management and Medication Current Pain Management: Electronic Signature(s) Signed: 03/25/2019 2:42:16 PM By: Paulla Fore, RRT, CHT Signed: 03/25/2019 4:44:40 PM By: Montey Hora Entered By: Lorine Bears on 03/25/2019 13:28:36 Adam Merritt ([id number]) -------------------------------------------------------------------------------- Patient/Caregiver Education Details Patient Name: Adam Merritt Date of Service: 03/25/2019 1:30 PM Medical Record Number: [id number] Patient Account Number: [id number] Date of Birth/Gender: Dec 13, 1945 (73 y.o. M) Treating RN: Montey Hora Primary Care Physician: [name] Other Clinician: Referring Physician: [name] Treating Physician/Extender: Sharalyn Ink in Treatment: 1 Education Assessment Education Provided To: Patient and Caregiver Education Topics Provided Venous: Handouts: Other: wound care as ordered Methods: Explain/Verbal Responses: State content correctly Electronic Signature(s) Signed: 03/25/2019 4:44:40 PM By: Montey Hora Entered By: Montey Hora on 03/25/2019 14:04:14 Adam Merritt ([id number]) -------------------------------------------------------------------------------- Wound Assessment Details Patient Name: Adam Merritt Date of Service: 03/25/2019 1:30 PM Medical Record Number: [id number] Patient Account Number: [id number] Date of Birth/Sex: 11-Oct-1945 (73 y.o. M) Treating RN: Army Melia Primary Care Dujuan Stankowski: [name] Other Clinician: Referring Kemper Heupel: [name] Treating Jachai Okazaki/Extender: Melburn Hake, [name]: 1 Wound Status Wound Number: 24 Primary Diabetic Wound/Ulcer of the Lower Extremity Etiology: Wound Location: Right Toe Second - Distal Wound Open Wounding Event: Gradually Appeared Status: Date  Acquired: 03/08/2019 Comorbid Anemia, Lymphedema, Chronic Obstructive Weeks Of Treatment: 1 History: Pulmonary Disease (COPD), Sleep Apnea, Clustered Wound: No Congestive Heart Failure, Coronary Artery Disease, Hypertension, Peripheral Venous Disease, Type II Diabetes, Osteoarthritis, Neuropathy, Received Chemotherapy Photos Wound Measurements Length: (cm) 0.9 % Reduction i Width: (cm) 0.6 % Reduction i Depth: (cm) 0.3 Epithelializa Area: (cm) 0.424 Tunneling: Volume: (cm) 0.127 Undermining: n Area: 45.5% n Volume: -62.8% tion: Large (67-100%) No No Wound Description Classification: Grade 2 Foul Odor Aft Wound Margin: Flat and Intact Slough/Fibrin Exudate Amount: Small Exudate Type: Serous Exudate Color: amber er Cleansing: No o Yes Wound Bed Granulation Amount: Small (1-33%) Exposed Structure Granulation Quality: Red Fascia Exposed: No Necrotic Amount: Large (67-100%) Fat Layer (Subcutaneous Tissue) Exposed: Yes Necrotic Quality: Adherent Slough Tendon Exposed: No Muscle Exposed: No Joint Exposed: No Bone Exposed: No Adam Merritt, Adam Merritt ([id number]) Treatment Notes Wound #24 (Right, Distal Toe Second) Notes toe - silvercel and conform; leg - silvercel, xtrasorb, abd and 4 layer wrap with unna to anchor Electronic Signature(s) Signed: 03/26/2019 8:00:28 AM By: Army Melia Entered By: Army Melia on 03/25/2019 13:36:18 Adam Merritt ([id number]) -------------------------------------------------------------------------------- Wound Assessment Details Patient Name: Adam Merritt Date of Service: 03/25/2019 1:30 PM Medical Record Number: [id number] Patient Account Number: [id number] Date of Birth/Sex: 05-10-1946 (73 y.o. M) Treating RN: Army Melia Primary Care Franziska Podgurski: [name] Other Clinician: Referring Rawn Quiroa: [name] Treating Hortencia Martire/Extender: STONE III, [name]: 1 Wound Status Wound Number: 25 Primary Diabetic Wound/Ulcer of  the Lower Extremity Etiology: Wound Location: Left Lower Leg - Anterior Wound Open Wounding Event: Gradually Appeared Status: Date Acquired: 03/08/2019 Comorbid Anemia, Lymphedema, Chronic Obstructive Weeks Of Treatment: 1 History: Pulmonary Disease (COPD), Sleep Apnea, Clustered Wound: No Congestive Heart Failure, Coronary Artery Disease, Hypertension, Peripheral  Venous Disease, Type II Diabetes, Osteoarthritis, Neuropathy, Received Chemotherapy Photos Wound Measurements Length: (cm) 1.2 % Reduction Width: (cm) 7 % Reduction Depth: (cm) 0.1 Epithelializ Area: (cm) 6.597 Tunneling: Volume: (cm) 0.66 Undermining in Area: 37.8% in Volume: 37.7% ation: None No : No Wound Description Classification: Grade 2 Wound Margin: Flat and Intact Exudate Amount: Large Exudate Type: Purulent Exudate Color: yellow, brown, green Wound Bed Granulation Amount: Small (1-33%) Exposed Structure Granulation Quality: Pink Fascia Exposed: No Necrotic Amount: Large (67-100%) Fat Layer (Subcutaneous Tissue) Exposed: Yes Necrotic Quality: Adherent Slough Tendon Exposed: No Muscle Exposed: No Joint Exposed: No Bone Exposed: No Adam Merritt, Adam Merritt ([id number]) Treatment Notes Wound #25 (Left, Anterior Lower Leg) Notes toe - silvercel and conform; leg - silvercel, xtrasorb, abd and 4 layer wrap with unna to anchor Electronic Signature(s) Signed: 03/26/2019 8:00:28 AM By: Army Melia Entered By: Army Melia on 03/25/2019 13:36:46 Adam Merritt ([id number]) -------------------------------------------------------------------------------- Vitals Details Patient Name: Adam Merritt Date of Service: 03/25/2019 1:30 PM Medical Record Number: [id number] Patient Account Number: [id number] Date of Birth/Sex: 12/13/1945 (73 y.o. M) Treating RN: Montey Hora Primary Care Zaelynn Fuchs: [name] Other Clinician: Referring Dick Hark: [name] Treating Savaughn Karwowski/Extender: Melburn Hake, [name] in  Treatment: 1 Vital Signs Time Taken: 13:28 Temperature (F): 98.4 Height (in): 66 Pulse (bpm): 70 Weight (lbs): 297 Respiratory Rate (breaths/min): 16 Body Mass Index (BMI): 47.9 Blood Pressure (mmHg): 154/74 Reference Range: 80 - 120 mg / dl Electronic Signature(s) Signed: 03/25/2019 2:42:16 PM By: Lorine Bears RCP, RRT, CHT Entered By: Lorine Bears on 03/25/2019 13:28:59

## 2019-03-27 ENCOUNTER — Other Ambulatory Visit: Payer: Self-pay | Admitting: Nurse Practitioner

## 2019-03-27 DIAGNOSIS — E1142 Type 2 diabetes mellitus with diabetic polyneuropathy: Secondary | ICD-10-CM

## 2019-03-27 MED ORDER — GABAPENTIN 300 MG PO CAPS: ORAL_CAPSULE | ORAL | 3 refills | Status: DC

## 2019-03-28 ENCOUNTER — Other Ambulatory Visit: Payer: Self-pay

## 2019-03-28 DIAGNOSIS — E11621 Type 2 diabetes mellitus with foot ulcer: Secondary | ICD-10-CM | POA: Diagnosis not present

## 2019-03-29 NOTE — Progress Notes (Signed)
Adam Merritt, Adam Merritt ([id number]) Visit Report for 03/28/2019 Arrival Information Details Patient Name: Adam Merritt, Adam Merritt Date of Service: 03/28/2019 3:15 PM Medical Record Number: [id number] Patient Account Number: [id number] Date of Birth/Sex: [birth year] (73 y.o. M) Treating RN: Cornell Barman Primary Care Jaxxen Voong: [name] Other Clinician: Referring Chord Takahashi: [name] Treating Caven Perine/Extender: Melburn Hake, [name]: 1 Visit Information History Since Last Visit Added or deleted any medications: No Patient Arrived: Cane Any new allergies or adverse reactions: No Arrival Time: 15:27 Had a fall or experienced change in No Accompanied By: wife activities of daily living that may affect Transfer Assistance: None risk of falls: Patient Identification Verified: Yes Signs or symptoms of abuse/neglect since last visito No Secondary Verification Process Yes Hospitalized since last visit: No Completed: Implantable device outside of the clinic excluding No Patient Has Alerts: Yes cellular tissue based products placed in the center Patient Alerts: Patient on Blood since last visit: Thinner Pain Present Now: No DMII aspirin 325 ABI 10/22/17 L 1.15 R .98 TBI L .66 R .61 Electronic Signature(s) Signed: 03/28/2019 5:23:28 PM By: Gretta Cool, BSN, RN, CWS, Kim RN, BSN Entered By: Gretta Cool, BSN, RN, CWS, Kim on 03/28/2019 15:28:01 Adam Merritt ([id number]) -------------------------------------------------------------------------------- Compression Therapy Details Patient Name: Adam Merritt Date of Service: 03/28/2019 3:15 PM Medical Record Number: [id number] Patient Account Number: [id number] Date of Birth/Sex: [birth year] (73 y.o. M) Treating RN: Cornell Barman Primary Care Krystn Dermody: [name] Other Clinician: Referring Roshawn Lacina: [name] Treating Jahkai Yandell/Extender: Melburn Hake, [name]: 1 Compression Therapy Performed for Wound Assessment: Wound #25 Left,Anterior  Lower Leg Performed By: Clinician Cornell Barman, RN Compression Type: Four Layer Electronic Signature(s) Signed: 03/28/2019 5:23:28 PM By: Gretta Cool, BSN, RN, CWS, Kim RN, BSN Entered By: Gretta Cool, BSN, RN, CWS, Kim on 03/28/2019 15:36:07 Adam Merritt ([id number]) -------------------------------------------------------------------------------- Encounter Discharge Information Details Patient Name: Adam Merritt Date of Service: 03/28/2019 3:15 PM Medical Record Number: [id number] Patient Account Number: [id number] Date of Birth/Sex: May 17, 1946 (73 y.o. M) Treating RN: Cornell Barman Primary Care Zuzanna Maroney: [name] Other Clinician: Referring Jaiana Sheffer: [name] Treating Rosha Cocker/Extender: Melburn Hake, [name]: 1 Encounter Discharge Information Items Discharge Condition: Stable Ambulatory Status: Walker Discharge Destination: Home Transportation: Private Auto Accompanied By: wife Schedule Follow-up Appointment: Yes Clinical Summary of Care: Electronic Signature(s) Signed: 03/28/2019 5:23:28 PM By: Gretta Cool, BSN, RN, CWS, Kim RN, BSN Entered By: Gretta Cool, BSN, RN, CWS, Kim on 03/28/2019 15:37:39 Adam Merritt ([id number]) -------------------------------------------------------------------------------- Wound Assessment Details Patient Name: Adam Merritt Date of Service: 03/28/2019 3:15 PM Medical Record Number: [id number] Patient Account Number: [id number] Date of Birth/Sex: 1945-07-23 (73 y.o. M) Treating RN: Cornell Barman Primary Care Ladarrious Kirksey: [name] Other Clinician: Referring Marigene Erler: [name] Treating Akeia Perot/Extender: Melburn Hake, [name]: 1 Wound Status Wound Number: 24 Primary Diabetic Wound/Ulcer of the Lower Extremity Etiology: Wound Location: Right Toe Second - Distal Wound Open Wounding Event: Gradually Appeared Status: Date Acquired: 03/08/2019 Comorbid Anemia, Lymphedema, Chronic Obstructive Weeks Of Treatment: 1 History: Pulmonary  Disease (COPD), Sleep Apnea, Clustered Wound: No Congestive Heart Failure, Coronary Artery Disease, Hypertension, Peripheral Venous Disease, Type II Diabetes, Osteoarthritis, Neuropathy, Received Chemotherapy Photos Wound Measurements Length: (cm) 0.5 % Reduction in Width: (cm) 0.6 % Reduction in Depth: (cm) 0.2 Epithelializat Area: (cm) 0.236 Tunneling: Volume: (cm) 0.047 Undermining: Area: 69.7% Volume: 39.7% ion: Large (67-100%) No No Wound Description Classification: Grade 2 Foul Odor Afte Wound Margin: Flat and Intact Slough/Fibrino Exudate Amount: Small Exudate Type: Serous Exudate Color:  amber r Cleansing: No Yes Wound Bed Granulation Amount: Large (67-100%) Exposed Structure Granulation Quality: Red Fascia Exposed: No Necrotic Amount: Small (1-33%) Fat Layer (Subcutaneous Tissue) Exposed: Yes Necrotic Quality: Adherent Slough Tendon Exposed: No Muscle Exposed: No Joint Exposed: No Bone Exposed: No Adam Merritt, Adam Merritt ([id number]) Treatment Notes Wound #24 (Right, Distal Toe Second) Notes Silver cell to toe and left leg, conform to toe, ABD xtra sorb with 4 layer to left leg Electronic Signature(s) Signed: 03/28/2019 5:23:28 PM By: Gretta Cool, BSN, RN, CWS, Kim RN, BSN Entered By: Gretta Cool, BSN, RN, CWS, Kim on 03/28/2019 15:35:16 Adam Merritt ([id number]) -------------------------------------------------------------------------------- Wound Assessment Details Patient Name: Adam Merritt Date of Service: 03/28/2019 3:15 PM Medical Record Number: [id number] Patient Account Number: [id number] Date of Birth/Sex: 12/04/1945 (73 y.o. M) Treating RN: Cornell Barman Primary Care Efrem Pitstick: [name] Other Clinician: Referring Damel Querry: [name] Treating Feige Lowdermilk/Extender: Melburn Hake, [name]: 1 Wound Status Wound Number: 25 Primary Diabetic Wound/Ulcer of the Lower Extremity Etiology: Wound Location: Left Lower Leg - Anterior Wound Open Wounding  Event: Gradually Appeared Status: Date Acquired: 03/08/2019 Comorbid Anemia, Lymphedema, Chronic Obstructive Weeks Of Treatment: 1 History: Pulmonary Disease (COPD), Sleep Apnea, Clustered Wound: No Congestive Heart Failure, Coronary Artery Disease, Hypertension, Peripheral Venous Disease, Type II Diabetes, Osteoarthritis, Neuropathy, Received Chemotherapy Photos Wound Measurements Length: (cm) 1.7 % Reduction i Width: (cm) 0.9 % Reduction i Depth: (cm) 0.1 Epithelializa Area: (cm) 1.202 Tunneling: Volume: (cm) 0.12 Undermining: n Area: 88.7% n Volume: 88.7% tion: None No No Wound Description Classification: Grade 2 Wound Margin: Flat and Intact Exudate Amount: Large Exudate Type: Purulent Exudate Color: yellow, brown, green Wound Bed Granulation Amount: Small (1-33%) Exposed Structure Granulation Quality: Pink Fascia Exposed: No Necrotic Amount: Large (67-100%) Fat Layer (Subcutaneous Tissue) Exposed: Yes Necrotic Quality: Adherent Slough Tendon Exposed: No Muscle Exposed: No Joint Exposed: No Bone Exposed: No Adam Merritt, Adam Merritt ([id number]) Treatment Notes Wound #25 (Left, Anterior Lower Leg) Notes Silver cell to toe and left leg, conform to toe, ABD xtra sorb with 4 layer to left leg Electronic Signature(s) Signed: 03/28/2019 5:23:28 PM By: Gretta Cool, BSN, RN, CWS, Kim RN, BSN Entered By: Gretta Cool, BSN, RN, CWS, Kim on 03/28/2019 15:35:38

## 2019-03-31 ENCOUNTER — Ambulatory Visit: Payer: Medicare Other | Admitting: Family

## 2019-04-01 ENCOUNTER — Other Ambulatory Visit: Payer: Self-pay

## 2019-04-01 DIAGNOSIS — E11621 Type 2 diabetes mellitus with foot ulcer: Secondary | ICD-10-CM | POA: Diagnosis not present

## 2019-04-01 NOTE — Progress Notes (Signed)
SHELTON, SQUARE ([id number]) Visit Report for 04/01/2019 Arrival Information Details Patient Name: Adam Merritt, Adam Merritt Date of Service: 04/01/2019 9:30 AM Medical Record Number: [id number] Patient Account Number: [id number] Date of Birth/Sex: 04/10/1946 (73 y.o. M) Treating RN: Army Melia Primary Care Dimitris Shanahan: [name] Other Clinician: Referring Kemonie Cutillo: [name] Treating Dianelly Ferran/Extender: Melburn Hake, [name]: 2 Visit Information History Since Last Visit Added or deleted any medications: No Patient Arrived: Walker Any new allergies or adverse reactions: No Arrival Time: 09:18 Had a fall or experienced change in No Accompanied By: self activities of daily living that may affect Transfer Assistance: None risk of falls: Patient Has Alerts: Yes Signs or symptoms of abuse/neglect since last visito No Patient Alerts: Patient on Blood Thinner Hospitalized since last visit: No DMII Has Dressing in Place as Prescribed: Yes aspirin 325 ABI 10/22/17 L 1.15 R .98 Pain Present Now: No TBI L .66 R .61 Electronic Signature(s) Signed: 04/01/2019 11:16:23 AM By: Army Melia Entered By: Army Melia on 04/01/2019 09:18:43 Adam Merritt ([id number]) -------------------------------------------------------------------------------- Compression Therapy Details Patient Name: Adam Merritt Date of Service: 04/01/2019 9:30 AM Medical Record Number: [id number] Patient Account Number: [id number] Date of Birth/Sex: 01/06/1946 (73 y.o. M) Treating RN: Army Melia Primary Care Nuri Larmer: [name] Other Clinician: Referring Tylicia Sherman: [name] Treating Temple Ewart/Extender: Melburn Hake, [name]: 2 Compression Therapy Performed for Wound Assessment: Wound #25 Left,Anterior Lower Leg Performed By: Clinician Army Melia, RN Compression Type: Four Layer Electronic Signature(s) Signed: 04/01/2019 11:16:23 AM By: Army Melia Entered By: Army Melia on 04/01/2019  09:40:55 Adam Merritt ([id number]) -------------------------------------------------------------------------------- Encounter Discharge Information Details Patient Name: Adam Merritt Date of Service: 04/01/2019 9:30 AM Medical Record Number: [id number] Patient Account Number: [id number] Date of Birth/Sex: March 01, 1946 (73 y.o. M) Treating RN: Army Melia Primary Care Anais Koenen: [name] Other Clinician: Referring Christop Hippert: [name] Treating [name] Hickmon/Extender: Melburn Hake, [name]: 2 Encounter Discharge Information Items Discharge Condition: Stable Ambulatory Status: Cane Discharge Destination: Home Transportation: Private Auto Accompanied By: wife Schedule Follow-up Appointment: Yes Clinical Summary of Care: Electronic Signature(s) Signed: 04/01/2019 11:16:23 AM By: Army Melia Entered By: Army Melia on 04/01/2019 09:41:53 Adam Merritt ([id number]) -------------------------------------------------------------------------------- Wound Assessment Details Patient Name: Adam Merritt Date of Service: 04/01/2019 9:30 AM Medical Record Number: [id number] Patient Account Number: [id number] Date of Birth/Sex: March 19, 1946 (73 y.o. M) Treating RN: Army Melia Primary Care Niamya Vittitow: [name] Other Clinician: Referring Lavi Sheehan: [name] Treating Casey Fye/Extender: Melburn Hake, [name]: 2 Wound Status Wound Number: 24 Primary Diabetic Wound/Ulcer of the Lower Extremity Etiology: Wound Location: Right Toe Second - Distal Wound Open Wounding Event: Gradually Appeared Status: Date Acquired: 03/08/2019 Comorbid Anemia, Lymphedema, Chronic Obstructive Weeks Of Treatment: 2 History: Pulmonary Disease (COPD), Sleep Apnea, Clustered Wound: No Congestive Heart Failure, Coronary Artery Disease, Hypertension, Peripheral Venous Disease, Type II Diabetes, Osteoarthritis, Neuropathy, Received Chemotherapy Photos Wound Measurements Length: (cm)  0.9 % Reduction in Width: (cm) 0.5 % Reduction in Depth: (cm) 0.1 Epithelializat Area: (cm) 0.353 Volume: (cm) 0.035 Area: 54.6% Volume: 55.1% ion: Large (67-100%) Wound Description Classification: Grade 2 Foul Odor Afte Wound Margin: Flat and Intact Slough/Fibrino Exudate Amount: Small Exudate Type: Serous Exudate Color: amber r Cleansing: No Yes Wound Bed Granulation Amount: Large (67-100%) Exposed Structure Granulation Quality: Red Fascia Exposed: No Necrotic Amount: Small (1-33%) Fat Layer (Subcutaneous Tissue) Exposed: Yes Necrotic Quality: Adherent Slough Tendon Exposed: No Muscle Exposed: No Joint Exposed: No Bone Exposed: No RONI, SCOW ([id number])  Electronic Signature(s) Signed: 04/01/2019 11:16:23 AM By: Army Melia Entered By: Army Melia on 04/01/2019 09:40:17 Adam Merritt ([id number]) -------------------------------------------------------------------------------- Wound Assessment Details Patient Name: Adam Merritt Date of Service: 04/01/2019 9:30 AM Medical Record Number: [id number] Patient Account Number: [id number] Date of Birth/Sex: 08/27/1945 (73 y.o. M) Treating RN: Army Melia Primary Care Chonita Gadea: [name] Other Clinician: Referring Eilleen Davoli: [name] Treating Juliett Eastburn/Extender: Melburn Hake, [name]: 2 Wound Status Wound Number: 25 Primary Diabetic Wound/Ulcer of the Lower Extremity Etiology: Wound Location: Left Lower Leg - Anterior Wound Open Wounding Event: Gradually Appeared Status: Date Acquired: 03/08/2019 Comorbid Anemia, Lymphedema, Chronic Obstructive Weeks Of Treatment: 2 History: Pulmonary Disease (COPD), Sleep Apnea, Clustered Wound: No Congestive Heart Failure, Coronary Artery Disease, Hypertension, Peripheral Venous Disease, Type II Diabetes, Osteoarthritis, Neuropathy, Received Chemotherapy Photos Wound Measurements Length: (cm) 0.1 % Reduction i Width: (cm) 0.1 % Reduction i Depth: (cm)  0.1 Epithelializa Area: (cm) 0.008 Volume: (cm) 0.001 n Area: 99.9% n Volume: 99.9% tion: None Wound Description Classification: Grade 2 Wound Margin: Flat and Intact Exudate Amount: Large Exudate Type: Purulent Exudate Color: yellow, brown, green Wound Bed Granulation Amount: Small (1-33%) Exposed Structure Granulation Quality: Pink Fascia Exposed: No Necrotic Amount: Large (67-100%) Fat Layer (Subcutaneous Tissue) Exposed: Yes Necrotic Quality: Adherent Slough Tendon Exposed: No Muscle Exposed: No Joint Exposed: No Bone Exposed: No DONLEY, HARLAND ([id number]) Treatment Notes Wound #25 (Left, Anterior Lower Leg) Notes Silver cell to toe and left leg, conform to toe, ABD xtra sorb with 4 layer to left leg Electronic Signature(s) Signed: 04/01/2019 11:16:23 AM By: Army Melia Entered By: Army Melia on 04/01/2019 09:40:35

## 2019-04-02 ENCOUNTER — Other Ambulatory Visit: Payer: Self-pay | Admitting: Nurse Practitioner

## 2019-04-02 DIAGNOSIS — E1165 Type 2 diabetes mellitus with hyperglycemia: Secondary | ICD-10-CM

## 2019-04-02 MED ORDER — BASAGLAR KWIKPEN 100 UNIT/ML ~~LOC~~ SOPN: PEN_INJECTOR | SUBCUTANEOUS | 2 refills | Status: DC

## 2019-04-03 ENCOUNTER — Ambulatory Visit (INDEPENDENT_AMBULATORY_CARE_PROVIDER_SITE_OTHER): Payer: Medicare Other | Admitting: Internal Medicine

## 2019-04-03 ENCOUNTER — Other Ambulatory Visit: Payer: Self-pay

## 2019-04-03 ENCOUNTER — Encounter: Payer: Self-pay | Admitting: Internal Medicine

## 2019-04-03 VITALS — BP 144/75 | HR 72 | Temp 97.6°F | Resp 16 | Ht 66.0 in | Wt 290.0 lb

## 2019-04-03 DIAGNOSIS — I5032 Chronic diastolic (congestive) heart failure: Secondary | ICD-10-CM | POA: Diagnosis not present

## 2019-04-03 DIAGNOSIS — G894 Chronic pain syndrome: Secondary | ICD-10-CM

## 2019-04-03 DIAGNOSIS — R0602 Shortness of breath: Secondary | ICD-10-CM

## 2019-04-03 DIAGNOSIS — I890 Lymphedema, not elsewhere classified: Secondary | ICD-10-CM

## 2019-04-03 DIAGNOSIS — G4733 Obstructive sleep apnea (adult) (pediatric): Secondary | ICD-10-CM | POA: Diagnosis not present

## 2019-04-03 NOTE — Progress Notes (Signed)
[facility] [place], [place] [address]  Pulmonary Sleep Medicine   Office Visit Note  Patient Name: Adam Merritt DOB: [birth year] MRN [id number]  Date of Service: 04/03/2019  Complaints/HPI: Patient is here for follow-up on basically he is doing about the same.  His spirometry today looked excellent.  He has sleep apnea for which he does not use CPAP device.  He is on oxygen therapy which he was questioning whether he should be using it and was instructed that yes he should be using it.  He has been doing well with this diabetes is well controlled.  Denies having any cough has some shortness of breath.  This morning he said he did have a cough but not really bringing up any phlegm.  Legs are still swollen this is chronic lymphedema is also being managed for his chronic pain has fentanyl patch  ROS  General: (-) fever, (-) chills, (-) night sweats, (-) weakness Skin: (-) rashes, (-) itching,. Eyes: (-) visual changes, (-) redness, (-) itching. Nose and Sinuses: (-) nasal stuffiness or itchiness, (-) postnasal drip, (-) nosebleeds, (-) sinus trouble. Mouth and Throat: (-) sore throat, (-) hoarseness. Neck: (-) swollen glands, (-) enlarged thyroid, (-) neck pain. Respiratory: + cough, (-) bloody sputum, - shortness of breath, - wheezing. Cardiovascular: + ankle swelling, (-) chest pain. Lymphatic: (-) lymph node enlargement. Neurologic: (-) numbness, (-) tingling. Psychiatric: (-) anxiety, (-) depression   Current Medication: Outpatient Encounter Medications as of 04/03/2019  Medication Sig  . albuterol (PROVENTIL HFA;VENTOLIN HFA) 108 (90 Base) MCG/ACT inhaler INHALE 1 PUFF INTO THE LUNGS EVERY 6 HOURS AS NEEDED FOR WHEEZING OR SHORTNESS OF BREATH (Patient taking differently: Inhale 1 puff into the lungs every 6 (six) hours as needed for wheezing or shortness of breath. )  . aspirin 325 MG EC tablet Take 325 mg by mouth daily.  Marland Kitchen atorvastatin (LIPITOR) 40  MG tablet Take 40 mg by mouth at bedtime.   . carvedilol (COREG) 25 MG tablet Take 25 mg by mouth 2 (two) times daily with a meal.  . docusate sodium (COLACE) 50 MG capsule Take 50 mg by mouth 2 (two) times daily.  . febuxostat (ULORIC) 40 MG tablet Take 1 tablet (40 mg total) by mouth daily.  . fentaNYL (DURAGESIC) 100 MCG/HR Place 1 patch onto the skin every 3 (three) days.  . furosemide (LASIX) 40 MG tablet Take 1 tablet (40 mg total) by mouth daily.  Marland Kitchen gabapentin (NEURONTIN) 300 MG capsule Take 2 cap in the morning, 1 cap in the evening and 2 cap at night.  . hydrALAZINE (APRESOLINE) 25 MG tablet   . indapamide (LOZOL) 2.5 MG tablet Take 1 tablet (2.5 mg total) by mouth daily.  . Insulin Glargine (BASAGLAR KWIKPEN) 100 UNIT/ML SOPN INJECT 30 TO 36 UNITS UNDER THE SKIN EVERY DAY  . Insulin Pen Needle (BD PEN NEEDLE NANO U/F) 32G X 4 MM MISC Use as directed with insulin DX e11.65  . ipratropium-albuterol (DUONEB) 0.5-2.5 (3) MG/3ML SOLN Take 3 mLs by nebulization every 4 (four) hours as needed (for shortness of breath).  . metolazone (ZAROXOLYN) 2.5 MG tablet   . Multiple Vitamin (MULTIVITAMIN WITH MINERALS) TABS tablet Take 1 tablet by mouth daily.  . ondansetron (ZOFRAN) 4 MG tablet Take 1 tablet (4 mg total) by mouth every 8 (eight) hours as needed.  [name] 87O MISC Use twice daily diag e11.65  . pentoxifylline (TRENTAL) 400 MG CR tablet TAKE 1  TABLET BY MOUTH THREE TIMES DAILY FOR CLAUDICATION  . potassium chloride SA (K-DUR) 20 MEQ tablet Take 1 tablet (20 mEq total) by mouth 3 (three) times daily. (Patient taking differently: Take 20 mEq by mouth 2 (two) times daily. )  . prazosin (MINIPRESS) 2 MG capsule Take 2 mg by mouth at bedtime.   . sennosides-docusate sodium (SENOKOT-S) 8.6-50 MG tablet Take 1-2 tablets by mouth 2 (two) times daily.  . sertraline (ZOLOFT) 100 MG tablet   . traZODone (DESYREL) 50 MG tablet Take 50 mg by mouth at bedtime.  Marland Kitchen VICTOZA 18 MG/3ML  SOPN INJECT 1.8MG  INTO THE SKIN DAILY   No facility-administered encounter medications on file as of 04/03/2019.     Surgical History: Past Surgical History:  Procedure Laterality Date  . 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 CHOLECYSTECTOMY    . CORONARY ARTERY BYPASS GRAFT      Medical History: Past Medical History:  Diagnosis Date  . Anemia   . CAD (coronary artery disease)   . Chest pain   . Coronary artery disease   . Diabetes mellitus without complication ([place])   . Diastolic CHF ([place])   . Esophageal reflux   . Herpes zoster without mention of complication   . Hyperlipidemia   . Hypertension   . Insomnia   . Lumbago   . Lymphedema   . Neuropathy in diabetes ([place])   . Obstructive chronic bronchitis without exacerbation ([place])   . Other malaise and fatigue   . Stroke ([place])   . Varicose veins     Family History: Family History  Problem Relation Age of Onset  . Diabetes Mother   . Hyperlipidemia Mother   . Hypertension Mother   . Diabetes Father   . Hyperlipidemia Father   . Hypertension Father   . CAD Father     Social History: Social History   Socioeconomic History  . Marital status: Married    Spouse name: Not on file  . Number of children: Not on file  . Years of education: Not on file  . Highest education level: Not on file  Occupational History  . Not on file  Social Needs  . Financial resource strain: Not on file  . Food insecurity    Worry: Not on file    Inability: Not on file  . Transportation needs    Medical: Not on file    Non-medical: Not on file  Tobacco Use  . Smoking status: Never Smoker  . Smokeless tobacco: Never Used  Substance and Sexual Activity  . Alcohol use: No    Comment: quit alcohol 30 years ago  . Drug use: No  . Sexual activity: Not on file  Lifestyle  . Physical activity    Days per week: Not on  file    Minutes per session: Not on file  . Stress: Not on file  Relationships  . Social Herbalist on phone: Not on file    Gets together: Not on file    Attends religious service: Not on file    Active member of club or organization: Not on file    Attends meetings of clubs or organizations: Not on file    Relationship status: Not on file  . Intimate partner violence    Fear of current or ex partner: Not on file    Emotionally abused:  Not on file    Physically abused: Not on file    Forced sexual activity: Not on file  Other Topics Concern  . Not on file  Social History Narrative   Lives at home with wife, uses a wheelchair now    Vital Signs: Blood pressure (!) 144/75, pulse 72, temperature 97.6 F (36.4 C), temperature source Temporal, resp. rate 16, height 5\' 6"  (1.676 m), weight 290 lb (131.5 kg), SpO2 97 %.  Examination: General Appearance: The patient is well-developed, well-nourished, and in no distress. Skin: Gross inspection of skin unremarkable. Head: normocephalic, no gross deformities. Eyes: no gross deformities noted. ENT: ears appear grossly normal no exudates. Neck: Supple. No thyromegaly. No LAD. Respiratory: few rhonchi noted at this time. Cardiovascular: Normal S1 and S2 without murmur or rub. Extremities: No cyanosis. pulses are equal. Neurologic: Alert and oriented. No involuntary movements.  LABS: Recent Results (from the past 2160 hour(s))  Lipase, blood     Status: None   Collection Time: 02/14/19  9:29 PM  Result Value Ref Range   Lipase 35 11 - 51 U/L    Comment: Performed at [facility], [address] [place], [place] [address]  Comprehensive metabolic panel     Status: Abnormal   Collection Time: 02/14/19  9:29 PM  Result Value Ref Range   Sodium 136 135 - 145 mmol/L   Potassium 3.8 3.5 - 5.1 mmol/L   Chloride 95 (L) 98 - 111 mmol/L   CO2 27 22 - 32 mmol/L   Glucose, Bld 191 (H) 70 - 99 mg/dL   BUN 21 8 - 23  mg/dL   Creatinine, Ser 1.93 (H) 0.61 - 1.24 mg/dL   Calcium 9.4 8.9 - 10.3 mg/dL   Total Protein 8.1 6.5 - 8.1 g/dL   Albumin 4.1 3.5 - 5.0 g/dL   AST 24 15 - 41 U/L   ALT 17 0 - 44 U/L   Alkaline Phosphatase 83 38 - 126 U/L   Total Bilirubin 1.0 0.3 - 1.2 mg/dL   GFR calc non Af Amer 33 (L) >60 mL/min   GFR calc Af Amer 39 (L) >60 mL/min   Anion gap 14 5 - 15    Comment: Performed at [facility], [address] [place], [place] [address]  CBC     Status: Abnormal   Collection Time: 02/14/19  9:29 PM  Result Value Ref Range   WBC 11.9 (H) 4.0 - 10.5 K/uL   RBC 4.41 4.22 - 5.81 MIL/uL   Hemoglobin 14.2 13.0 - 17.0 g/dL   HCT 41.1 39.0 - 52.0 %   MCV 93.2 80.0 - 100.0 fL   MCH 32.2 26.0 - 34.0 pg   MCHC 34.5 30.0 - 36.0 g/dL   RDW 11.9 11.5 - 15.5 %   Platelets 166 150 - 400 K/uL   nRBC 0.0 0.0 - 0.2 %    Comment: Performed at [facility], [address] [place], [place] [address]  Urinalysis, Complete w Microscopic     Status: Abnormal   Collection Time: 02/15/19  1:19 AM  Result Value Ref Range   Color, Urine YELLOW (A) YELLOW   APPearance HAZY (A) CLEAR   Specific Gravity, Urine 1.013 1.005 - 1.030   pH 5.0 5.0 - 8.0   Glucose, UA NEGATIVE NEGATIVE mg/dL   Hgb urine dipstick NEGATIVE NEGATIVE   Bilirubin Urine NEGATIVE NEGATIVE   Ketones, ur NEGATIVE NEGATIVE mg/dL   Protein, ur NEGATIVE NEGATIVE mg/dL  Nitrite NEGATIVE NEGATIVE   Leukocytes,Ua NEGATIVE NEGATIVE   RBC / HPF 0-5 0 - 5 RBC/hpf   WBC, UA 0-5 0 - 5 WBC/hpf   Bacteria, UA NONE SEEN NONE SEEN   Squamous Epithelial / LPF 0-5 0 - 5   Mucus PRESENT    Hyaline Casts, UA PRESENT     Comment: Performed at [facility], [address] [place], [place] [address]  Glucose, capillary     Status: Abnormal   Collection Time: 02/24/19 12:30 PM  Result Value Ref Range   Glucose-Capillary 200 (H) 70 - 99 mg/dL  POCT HgB A1C     Status: Abnormal   Collection Time: 03/13/19  2:01 PM   Result Value Ref Range   Hemoglobin A1C 6.3 (A) 4.0 - 5.6 %   HbA1c POC (<> result, manual entry)     HbA1c, POC (prediabetic range)     HbA1c, POC (controlled diabetic range)    CBC     Status: Abnormal   Collection Time: 03/18/19  3:36 PM  Result Value Ref Range   WBC 7.3 4.0 - 10.5 K/uL   RBC 3.85 (L) 4.22 - 5.81 MIL/uL   Hemoglobin 12.4 (L) 13.0 - 17.0 g/dL   HCT 36.3 (L) 39.0 - 52.0 %   MCV 94.3 80.0 - 100.0 fL   MCH 32.2 26.0 - 34.0 pg   MCHC 34.2 30.0 - 36.0 g/dL   RDW 12.3 11.5 - 15.5 %   Platelets 156 150 - 400 K/uL   nRBC 0.0 0.0 - 0.2 %    Comment: Performed at [facility], [address] [place], [place] [address]  PSA     Status: None   Collection Time: 03/18/19  3:36 PM  Result Value Ref Range   Prostatic Specific Antigen 0.15 0.00 - 4.00 ng/mL    Comment: (NOTE) While PSA levels of <=4.0 ng/ml are reported as reference range, some men with levels below 4.0 ng/ml can have prostate cancer and many men with PSA above 4.0 ng/ml do not have prostate cancer.  Other tests such as free PSA, age specific reference ranges, PSA velocity and PSA doubling time may be helpful especially in men less than 26 years old. Performed at [facility], [place] [address], [place], [place] [address]   Ferritin     Status: None   Collection Time: 03/18/19  3:36 PM  Result Value Ref Range   Ferritin 74 24 - 336 ng/mL    Comment: Performed at [facility], [address] [place], [place] [address]  Vitamin B12     Status: None   Collection Time: 03/18/19  3:36 PM  Result Value Ref Range   Vitamin B-12 455 180 - 914 pg/mL    Comment: (NOTE) This assay is not validated for testing neonatal or myeloproliferative syndrome specimens for Vitamin B12 levels. Performed at [facility], [place] [address] [place], [place] [address]   Folate     Status: None   Collection Time: 03/18/19  3:36 PM  Result Value Ref Range   Folate 17.1 >5.9 ng/mL    Comment:  Performed at [facility], [address] [place], [place] [address]  Comprehensive metabolic panel     Status: Abnormal   Collection Time: 03/18/19  3:36 PM  Result Value Ref Range   Sodium 137 135 - 145 mmol/L   Potassium 3.3 (L) 3.5 - 5.1 mmol/L   Chloride 99 98 - 111 mmol/L  CO2 25 22 - 32 mmol/L   Glucose, Bld 123 (H) 70 - 99 mg/dL   BUN 34 (H) 8 - 23 mg/dL   Creatinine, Ser 2.76 (H) 0.61 - 1.24 mg/dL   Calcium 8.8 (L) 8.9 - 10.3 mg/dL   Total Protein 7.0 6.5 - 8.1 g/dL   Albumin 3.6 3.5 - 5.0 g/dL   AST 18 15 - 41 U/L   ALT 14 0 - 44 U/L   Alkaline Phosphatase 77 38 - 126 U/L   Total Bilirubin 0.5 0.3 - 1.2 mg/dL   GFR calc non Af Amer 22 (L) >60 mL/min   GFR calc Af Amer 25 (L) >60 mL/min   Anion gap 13 5 - 15    Comment: Performed at [facility], [address] [place], [place] [address]  Lipid panel     Status: Abnormal   Collection Time: 03/18/19  3:36 PM  Result Value Ref Range   Cholesterol 119 0 - 200 mg/dL   Triglycerides 183 (H) <150 mg/dL   HDL 21 (L) >40 mg/dL   Total CHOL/HDL Ratio 5.7 RATIO   VLDL 37 0 - 40 mg/dL   LDL Cholesterol 61 0 - 99 mg/dL    Comment:        Total Cholesterol/HDL:CHD Risk Coronary Heart Disease Risk Table                     Men   Women  1/2 Average Risk   3.4   3.3  Average Risk       5.0   4.4  2 X Average Risk   9.6   7.1  3 X Average Risk  23.4   11.0        Use the calculated Patient Ratio above and the CHD Risk Table to determine the patient's CHD Risk.        ATP III CLASSIFICATION (LDL):  <100     mg/dL   Optimal  100-129  mg/dL   Near or Above                    Optimal  130-159  mg/dL   Borderline  160-189  mg/dL   High  >190     mg/dL   Very High Performed at [facility], [address] [place], [place] [address]   TSH     Status: None   Collection Time: 03/18/19  3:36 PM  Result Value Ref Range   TSH 2.625 0.350 - 4.500 uIU/mL    Comment: Performed by a 3rd Generation  assay with a functional sensitivity of <=0.01 uIU/mL. Performed at [facility], [address] [place], [place] [address]   T4, free     Status: None   Collection Time: 03/18/19  3:36 PM  Result Value Ref Range   Free T4 0.99 0.61 - 1.12 ng/dL    Comment: (NOTE) Biotin ingestion may interfere with free T4 tests. If the results are inconsistent with the TSH level, previous test results, or the clinical presentation, then consider biotin interference. If needed, order repeat testing after stopping biotin. Performed at [facility], [address] [place],  [address]   Aerobic Culture (superficial specimen)     Status: None   Collection Time: 03/19/19 11:55 AM   Specimen: Leg  Result Value Ref Range   Specimen Description      LEG Performed at [facility], [place], [place]  [address]    Special Requests      NONE Performed at [facility], [place], [place] [address]    Gram Stain      NO WBC SEEN NO ORGANISMS SEEN Performed at [facility], [place] [address] [place], [place] [address]    Culture FEW PSEUDOMONAS PUTIDA    Report Status 03/22/2019 FINAL    Organism ID, Bacteria PSEUDOMONAS PUTIDA       Susceptibility   Pseudomonas putida - MIC*    CEFTAZIDIME 4 SENSITIVE Sensitive     CIPROFLOXACIN <=0.25 SENSITIVE Sensitive     GENTAMICIN <=1 SENSITIVE Sensitive     IMIPENEM 2 SENSITIVE Sensitive     PIP/TAZO 32 SENSITIVE Sensitive     CEFEPIME 2 SENSITIVE Sensitive     * FEW PSEUDOMONAS PUTIDA    Radiology: No results found.  No results found.  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 No cortical irregularity in the second digit identified. Soft tissue swelling over  the dorsum of the foot. Electronically Signed   By: Suzy Bouchard M.D.   On: 03/19/2019 09:43      Assessment and Plan: Patient Active Problem List   Diagnosis Date Noted  . Type 2 diabetes mellitus with hyperglycemia ([place]) 03/19/2019  . Aplastic anemia, unspecified ([place]) 03/19/2019  . Acute pain of right knee 02/23/2019  . Uncontrolled type 2 diabetes mellitus with hyperglycemia ([place]) 09/17/2018  . Primary insomnia 08/07/2018  . Bacteremia due to group B Streptococcus 07/02/2018  . COPD (chronic obstructive pulmonary disease) ([place]) 05/13/2018  . Dysuria 05/05/2018  . Encounter for general adult medical examination with abnormal findings 05/05/2018  . CHF exacerbation ([place]) 04/16/2018  . Achilles tendon disorder, right 04/02/2018  . Essential hypertension 01/28/2018  . Lymphedema of both lower extremities 01/28/2018  . Cellulitis and abscess of lower extremity 09/20/2017  . Edema of both lower legs due to peripheral venous insufficiency 09/20/2017  . Cellulitis 09/20/2017  . Atopic dermatitis 09/02/2017  . Chronic pain disorder 09/02/2017  . CVA (cerebral vascular accident) ([place]) 09/15/2016  . 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 CVA (cerebral infarction) 02/12/2015  . Chronic diastolic CHF (congestive heart failure), NYHA class 3 ([place]) 11/30/2014  . Benign essential hypertension 11/06/2014  . CAD (coronary artery disease) of artery bypass graft 01/19/2014  . CKD (chronic kidney disease), stage III 01/19/2014  . Acute bronchitis with COPD ([place]) 01/19/2014  . Diabetic neuropathy ([place]) 01/19/2014  . Insulin dependent diabetes mellitus 01/19/2014  . OSA (obstructive sleep apnea) 01/19/2014  . Mixed hyperlipidemia 10/29/2013  . Peripheral vascular disease, unspecified ([place]) 09/29/2013    1. OSA non compliant with CPAP on oxygen he has never been very compliant with his CPAP so right now is just on oxygen therapy.  He is not interested in trying the CPAP 2. CHF diastolic  due to will continue to watch his fluid intake and maintain euvolemia.  Patient right now appears to be compensated 3. Lymphedema at baseline 4. COPD/Asthma spirometry was done looks good FEV1 is within normal limits 5. Chronic pain pain management per his team  General Counseling: I have discussed the findings of the evaluation and examination with Adam Merritt.  I have also discussed any further diagnostic evaluation thatmay be needed or ordered today. Adam Merritt  verbalizes understanding of the findings of todays visit. We also reviewed his medications today and discussed drug interactions and side effects including but not limited excessive drowsiness and altered mental states. We also discussed that there is always a risk not just to him but also people around him. he has been encouraged to call the office with any questions or concerns that should arise related to todays visit.    Time spent: 20min  I have personally obtained a history, examined the patient, evaluated laboratory and imaging results, formulated the assessment and plan and placed orders.    Allyne Gee, MD [facility] Sleep [facility]

## 2019-04-03 NOTE — Patient Instructions (Signed)
Asthma, Adult ° °Asthma is a long-term (chronic) condition in which the airways get tight and narrow. The airways are the breathing passages that lead from the nose and mouth down into the lungs. A person with asthma will have times when symptoms get worse. These are called asthma attacks. They can cause coughing, whistling sounds when you breathe (wheezing), shortness of breath, and chest pain. They can make it hard to breathe. There is no cure for asthma, but medicines and lifestyle changes can help control it. °There are many things that can bring on an asthma attack or make asthma symptoms worse (triggers). Common triggers include: °· Mold. °· Dust. °· Cigarette smoke. °· Cockroaches. °· Things that can cause allergy symptoms (allergens). These include animal skin flakes (dander) and pollen from trees or grass. °· Things that pollute the air. These may include household cleaners, wood smoke, smog, or chemical odors. °· Cold air, weather changes, and wind. °· Crying or laughing hard. °· Stress. °· Certain medicines or drugs. °· Certain foods such as dried fruit, potato chips, and grape juice. °· Infections, such as a cold or the flu. °· Certain medical conditions or diseases. °· Exercise or tiring activities. °Asthma may be treated with medicines and by staying away from the things that cause asthma attacks. Types of medicines may include: °· Controller medicines. These help prevent asthma symptoms. They are usually taken every day. °· Fast-acting reliever or rescue medicines. These quickly relieve asthma symptoms. They are used as needed and provide short-term relief. °· Allergy medicines if your attacks are brought on by allergens. °· Medicines to help control the body's defense (immune) system. °Follow these instructions at home: °Avoiding triggers in your home °· Change your heating and air conditioning filter often. °· Limit your use of fireplaces and wood stoves. °· Get rid of pests (such as roaches and  mice) and their droppings. °· Throw away plants if you see mold on them. °· Clean your floors. Dust regularly. Use cleaning products that do not smell. °· Have someone vacuum when you are not home. Use a vacuum cleaner with a HEPA filter if possible. °· Replace carpet with wood, tile, or vinyl flooring. Carpet can trap animal skin flakes and dust. °· Use allergy-proof pillows, mattress covers, and box spring covers. °· Wash bed sheets and blankets every week in hot water. Dry them in a dryer. °· Keep your bedroom free of any triggers. °· Avoid pets and keep windows closed when things that cause allergy symptoms are in the air. °· Use blankets that are made of polyester or cotton. °· Clean bathrooms and kitchens with bleach. If possible, have someone repaint the walls in these rooms with mold-resistant paint. Keep out of the rooms that are being cleaned and painted. °· Wash your hands often with soap and water. If soap and water are not available, use hand sanitizer. °· Do not allow anyone to smoke in your home. °General instructions °· Take over-the-counter and prescription medicines only as told by your doctor. °? Talk with your doctor if you have questions about how or when to take your medicines. °? Make note if you need to use your medicines more often than usual. °· Do not use any products that contain nicotine or tobacco, such as cigarettes and e-cigarettes. If you need help quitting, ask your doctor. °· Stay away from secondhand smoke. °· Avoid doing things outdoors when allergen counts are high and when air quality is low. °· Wear a ski mask   when doing outdoor activities in the winter. The mask should cover your nose and mouth. Exercise indoors on cold days if you can. °· Warm up before you exercise. Take time to cool down after exercise. °· Use a peak flow meter as told by your doctor. A peak flow meter is a tool that measures how well the lungs are working. °· Keep track of the peak flow meter's readings.  Write them down. °· Follow your asthma action plan. This is a written plan for taking care of your asthma and treating your attacks. °· Make sure you get all the shots (vaccines) that your doctor recommends. Ask your doctor about a flu shot and a pneumonia shot. °· Keep all follow-up visits as told by your doctor. This is important. °Contact a doctor if: °· You have wheezing, shortness of breath, or a cough even while taking medicine to prevent attacks. °· The mucus you cough up (sputum) is thicker than usual. °· The mucus you cough up changes from clear or white to yellow, green, gray, or bloody. °· You have problems from the medicine you are taking, such as: °? A rash. °? Itching. °? Swelling. °? Trouble breathing. °· You need reliever medicines more than 2-3 times a week. °· Your peak flow reading is still at 50-79% of your personal best after following the action plan for 1 hour. °· You have a fever. °Get help right away if: °· You seem to be worse and are not responding to medicine during an asthma attack. °· You are short of breath even at rest. °· You get short of breath when doing very little activity. °· You have trouble eating, drinking, or talking. °· You have chest pain or tightness. °· You have a fast heartbeat. °· Your lips or fingernails start to turn blue. °· You are light-headed or dizzy, or you faint. °· Your peak flow is less than 50% of your personal best. °· You feel too tired to breathe normally. °Summary °· Asthma is a long-term (chronic) condition in which the airways get tight and narrow. An asthma attack can make it hard to breathe. °· Asthma cannot be cured, but medicines and lifestyle changes can help control it. °· Make sure you understand how to avoid triggers and how and when to use your medicines. °This information is not intended to replace advice given to you by your health care provider. Make sure you discuss any questions you have with your health care provider. °Document  Released: 11/08/2007 Document Revised: 07/25/2018 Document Reviewed: 06/26/2016 °[employer] Patient Education © 2020 [employer] ° °

## 2019-04-04 ENCOUNTER — Encounter: Payer: Medicare Other | Admitting: Internal Medicine

## 2019-04-04 DIAGNOSIS — E11621 Type 2 diabetes mellitus with foot ulcer: Secondary | ICD-10-CM | POA: Diagnosis not present

## 2019-04-04 NOTE — Progress Notes (Signed)
Adam Merritt, Adam Merritt ([id number]) Visit Report for 04/04/2019 Arrival Information Details Patient Name: Adam Merritt, Adam Merritt Date of Service: 04/04/2019 9:15 AM Medical Record Number: [id number] Patient Account Number: [id number] Date of Birth/Sex: Dec 30, 1945 (73 y.o. M) Treating RN: Army Melia Primary Care Lawsen Arnott: [name] Other Clinician: Referring Justis Dupas: [name] Treating Lisvet Rasheed/Extender: Tito Dine in Treatment: 2 Visit Information History Since Last Visit Added or deleted any medications: No Patient Arrived: Cane Any new allergies or adverse reactions: No Arrival Time: 09:20 Had a fall or experienced change in No Accompanied By: wife activities of daily living that may affect Transfer Assistance: None risk of falls: Patient Identification Verified: Yes Signs or symptoms of abuse/neglect since last visito No Patient Has Alerts: Yes Hospitalized since last visit: No Patient Alerts: Patient on Blood Thinner Has Dressing in Place as Prescribed: Yes DMII Pain Present Now: No aspirin 325 ABI 10/22/17 L 1.15 R .98 TBI L .66 R .61 Electronic Signature(s) Signed: 04/04/2019 2:11:27 PM By: Army Melia Entered By: Army Melia on 04/04/2019 09:20:54 Adam Merritt ([id number]) -------------------------------------------------------------------------------- Clinic Level of Care Assessment Details Patient Name: Adam Merritt Date of Service: 04/04/2019 9:15 AM Medical Record Number: [id number] Patient Account Number: [id number] Date of Birth/Sex: 03/16/1946 (73 y.o. M) Treating RN: Montey Hora Primary Care Rodel Glaspy: [name] Other Clinician: Referring Yonas Bunda: [name] Treating Verlee Pope/Extender: Tito Dine in Treatment: 2 Clinic Level of Care Assessment Items TOOL 4 Quantity Score []  - Use when only an EandM is performed on FOLLOW-UP visit 0 ASSESSMENTS - Nursing Assessment / Reassessment X - Reassessment of Co-morbidities (includes  updates in patient status) 1 10 X- 1 5 Reassessment of Adherence to Treatment Plan ASSESSMENTS - Wound and Skin Assessment / Reassessment []  - Simple Wound Assessment / Reassessment - one wound 0 X- 2 5 Complex Wound Assessment / Reassessment - multiple wounds []  - 0 Dermatologic / Skin Assessment (not related to wound area) ASSESSMENTS - Focused Assessment X - Circumferential Edema Measurements - multi extremities 1 5 []  - 0 Nutritional Assessment / Counseling / Intervention X- 1 5 Lower Extremity Assessment (monofilament, tuning fork, pulses) []  - 0 Peripheral Arterial Disease Assessment (using hand held doppler) ASSESSMENTS - Ostomy and/or Continence Assessment and Care []  - Incontinence Assessment and Management 0 []  - 0 Ostomy Care Assessment and Management (repouching, etc.) PROCESS - Coordination of Care X - Simple Patient / Family Education for ongoing care 1 15 []  - 0 Complex (extensive) Patient / Family Education for ongoing care X- 1 10 Staff obtains Programmer, systems, Records, Test Results / Process Orders []  - 0 Staff telephones HHA, Nursing Homes / Clarify orders / etc []  - 0 Routine Transfer to another Facility (non-emergent condition) []  - 0 Routine Hospital Admission (non-emergent condition) []  - 0 New Admissions / Biomedical engineer / Ordering NPWT, Apligraf, etc. []  - 0 Emergency Hospital Admission (emergent condition) X- 1 10 Simple Discharge Coordination Adam Merritt, Adam Merritt ([id number]) []  - 0 Complex (extensive) Discharge Coordination PROCESS - Special Needs []  - Pediatric / Minor Patient Management 0 []  - 0 Isolation Patient Management []  - 0 Hearing / Language / Visual special needs []  - 0 Assessment of Community assistance (transportation, D/C planning, etc.) []  - 0 Additional assistance / Altered mentation []  - 0 Support Surface(s) Assessment (bed, cushion, seat, etc.) INTERVENTIONS - Wound Cleansing / Measurement []  - Simple Wound Cleansing -  one wound 0 X- 2 5 Complex Wound Cleansing - multiple wounds X- 1 5 Wound Imaging (photographs -  any number of wounds) []  - 0 Wound Tracing (instead of photographs) []  - 0 Simple Wound Measurement - one wound X- 2 5 Complex Wound Measurement - multiple wounds INTERVENTIONS - Wound Dressings X - Small Wound Dressing one or multiple wounds 1 10 []  - 0 Medium Wound Dressing one or multiple wounds []  - 0 Large Wound Dressing one or multiple wounds []  - 0 Application of Medications - topical []  - 0 Application of Medications - injection INTERVENTIONS - Miscellaneous []  - External ear exam 0 []  - 0 Specimen Collection (cultures, biopsies, blood, body fluids, etc.) []  - 0 Specimen(s) / Culture(s) sent or taken to Lab for analysis []  - 0 Patient Transfer (multiple staff / Civil Service fast streamer / Similar devices) []  - 0 Simple Staple / Suture removal (25 or less) []  - 0 Complex Staple / Suture removal (26 or more) []  - 0 Hypo / Hyperglycemic Management (close monitor of Blood Glucose) []  - 0 Ankle / Brachial Index (ABI) - do not check if billed separately X- 1 5 Vital Signs Adam Merritt, Adam Merritt ([id number]) Has the patient been seen at the hospital within the last three years: Yes Total Score: 110 Level Of Care: New/Established - Level 3 Electronic Signature(s) Signed: 04/04/2019 5:07:31 PM By: Montey Hora Entered By: Montey Hora on 04/04/2019 10:21:07 Adam Merritt ([id number]) -------------------------------------------------------------------------------- Encounter Discharge Information Details Patient Name: Adam Merritt Date of Service: 04/04/2019 9:15 AM Medical Record Number: [id number] Patient Account Number: [id number] Date of Birth/Sex: 1945/08/09 (73 y.o. M) Treating RN: Army Melia Primary Care Martez Weiand: [name] Other Clinician: Referring Antavia Tandy: [name] Treating Lillyn Wieczorek/Extender: Tito Dine in Treatment: 2 Encounter Discharge Information  Items Discharge Condition: Stable Ambulatory Status: Cane Discharge Destination: Home Transportation: Private Auto Accompanied By: wife Schedule Follow-up Appointment: Yes Clinical Summary of Care: Electronic Signature(s) Signed: 04/04/2019 2:11:27 PM By: Army Melia Entered By: Army Melia on 04/04/2019 10:00:07 Adam Merritt ([id number]) -------------------------------------------------------------------------------- Lower Extremity Assessment Details Patient Name: Adam Merritt Date of Service: 04/04/2019 9:15 AM Medical Record Number: [id number] Patient Account Number: [id number] Date of Birth/Sex: [birth year] (73 y.o. M) Treating RN: Army Melia Primary Care Betty Daidone: [name] Other Clinician: Referring Jaymee Tilson: [name] Treating Orlene Salmons/Extender: Tito Dine in Treatment: 2 Edema Assessment Assessed: [Left: No] [Right: No] Edema: [Left: N] [Right: o] Calf Left: Right: Point of Measurement: 34 cm From Medial Instep 52 cm cm Ankle Left: Right: Point of Measurement: 12 cm From Medial Instep 32 cm cm Vascular Assessment Pulses: Dorsalis Pedis Palpable: [Left:Yes] Electronic Signature(s) Signed: 04/04/2019 2:11:27 PM By: Army Melia Entered By: Army Melia on 04/04/2019 09:32:51 Adam Merritt ([id number]) -------------------------------------------------------------------------------- Multi Wound Chart Details Patient Name: Adam Merritt Date of Service: 04/04/2019 9:15 AM Medical Record Number: [id number] Patient Account Number: [id number] Date of Birth/Sex: 1945/07/02 (73 y.o. 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 Extremity Comorbid History: Anemia, Lymphedema, Anemia, Lymphedema, N/A Chronic Obstructive Chronic Obstructive Pulmonary Disease (COPD), Pulmonary Disease (COPD), Sleep Apnea, Congestive Sleep Apnea, Congestive Heart Failure, Coronary Artery Heart Failure, Coronary Artery Disease, Hypertension,  Disease, Hypertension, Peripheral Venous Disease, Peripheral Venous Disease, Type II Diabetes, Type II Diabetes, Osteoarthritis, Neuropathy, Osteoarthritis, Neuropathy, Received Chemotherapy Received Chemotherapy Date Acquired: 03/08/2019 03/08/2019 N/A Weeks of Treatment: 2 2 N/A Wound Status: Open Healed - Epithelialized N/A Measurements L x W x D 0.9x0.5x0.2 0x0x0 N/A (cm) Area (cm) : 0.353 0 N/A Volume (cm) : 0.071 0 N/A % Reduction in Area: 54.60% 100.00% N/A % Reduction in Volume: 9.00% 100.00% N/A Classification: Grade 2 Grade 2 N/A Exudate Amount: Medium None Present N/A Exudate Type: Serous N/A N/A Exudate Color: amber N/A N/A Wound Margin: Flat and Intact Flat and Intact N/A Granulation Amount: Medium (34-66%) None Present (0%) N/A Adam Merritt, Adam Merritt ([id number]) Granulation Quality: Red N/A N/A Necrotic Amount: Small (1-33%) Large (67-100%) N/A Exposed Structures: Fat Layer (Subcutaneous Fascia: No N/A Tissue) Exposed: Yes Fat Layer (Subcutaneous Fascia: No Tissue) Exposed: No Tendon: No Tendon: No Muscle: No Muscle: No Joint: No Joint: No Bone: No Bone: No Epithelialization: Small (1-33%) Large (67-100%) N/A Treatment Notes Wound #24 (Right, Distal Toe Second) Notes Silver cell to toe and left leg, conform to toe, Electronic Signature(s) Signed: 04/04/2019 5:42:32 PM By: Linton Ham MD Entered  By: Linton Ham on 04/04/2019 10:06:58 Adam Merritt ([id number]) -------------------------------------------------------------------------------- [place] Details Patient Name: Adam Merritt, Adam Merritt Date of Service: 04/04/2019 9:15 AM Medical Record Number: [id number] Patient Account Number: [id number] Date of Birth/Sex: December 21, 1945 (73 y.o. M) Treating RN: Montey Hora Primary Care Roshanda Balazs: [name] Other Clinician: Referring Jeanita Carneiro: [name] Treating Dejohn Ibarra/Extender: Tito Dine in Treatment: 2 Active Inactive Abuse / Safety / Falls / Self Care Management Nursing Diagnoses: History of Falls Goals: Patient will remain injury free related to falls Date Initiated: 03/18/2019 Target Resolution Date: 06/14/2019 Goal Status: Active Interventions: Assess fall risk on admission and as needed Notes: Orientation to the Wound Care Program Nursing Diagnoses: Knowledge deficit related to the wound healing center program Goals: Patient/caregiver will verbalize understanding of the [place] Program Date Initiated: 03/18/2019 Target Resolution Date: 06/14/2019 Goal Status: Active Interventions: Provide education on orientation to the wound center Notes: Pressure Nursing Diagnoses: Knowledge deficit related to management of pressures ulcers Goals: Patient will remain free of pressure ulcers Date Initiated: 03/18/2019 Target Resolution Date: 06/14/2019 Goal Status: Active Interventions: Assess: immobility, friction, shearing, incontinence upon admission and as needed Adam Merritt, Adam Merritt ([id number]) Notes: Venous Leg Ulcer Nursing Diagnoses: Actual venous Insuffiency (use after diagnosis is confirmed) Goals: Patient will maintain optimal edema control Date Initiated: 03/18/2019 Target Resolution Date: 06/14/2019 Goal Status: Active Interventions: Assess peripheral edema status every visit. Compression as ordered Notes: Wound/Skin  Impairment Nursing Diagnoses: Impaired tissue integrity Goals: Ulcer/skin breakdown will heal within 14 weeks Date Initiated: 03/18/2019 Target Resolution Date: 06/14/2019 Goal Status: Active Interventions: Assess patient/caregiver ability to obtain necessary supplies Assess patient/caregiver ability to perform ulcer/skin care regimen upon admission and as needed Assess ulceration(s) every visit Notes: Electronic Signature(s) Signed: 04/04/2019 5:07:31 PM By: Montey Hora Entered By: Montey Hora on 04/04/2019 09:50:31 Adam Merritt ([id number]) -------------------------------------------------------------------------------- Pain Assessment Details Patient Name: Adam Merritt Date of Service: 04/04/2019 9:15 AM Medical Record Number: [id number] Patient Account Number: [id number] Date of Birth/Sex: 14-Jun-1945 (73 y.o. M) Treating RN: Army Melia Primary Care Jonet Mathies: [name] Other Clinician: Referring Daysy Santini: [name] Treating Zaidy Absher/Extender: Tito Dine in Treatment: 2 Active Problems Location of Pain Severity and Description of Pain Patient Has Paino No Site Locations Pain Management and Medication Current Pain Management: Electronic Signature(s) Signed: 04/04/2019  2:11:27 PM By: Army Melia Entered By: Army Melia on 04/04/2019 09:21:01 Adam Merritt ([id number]) -------------------------------------------------------------------------------- Patient/Caregiver Education Details Patient Name: Adam Merritt Date of Service: 04/04/2019 9:15 AM Medical Record Number: [id number] Patient Account Number: [id number] Date of Birth/Gender: 1945-10-03 (73 y.o. M) Treating RN: Montey Hora Primary Care Physician: [name] Other Clinician: Referring Physician: [name] Treating Physician/Extender: Tito Dine in Treatment: 2 Education Assessment Education Provided To: Patient and Caregiver Education Topics  Provided Venous: Handouts: Other: use your pumps Methods: Explain/Verbal Responses: State content correctly Electronic Signature(s) Signed: 04/04/2019 5:07:31 PM By: Montey Hora Entered By: Montey Hora on 04/04/2019 10:21:45 Adam Merritt ([id number]) -------------------------------------------------------------------------------- Wound Assessment Details Patient Name: Adam Merritt Date of Service: 04/04/2019 9:15 AM Medical Record Number: [id number] Patient Account Number: [id number] Date of Birth/Sex: Jul 10, 1945 (73 y.o. M) Treating RN: Army Melia Primary Care Emie Sommerfeld: [name] Other Clinician: Referring Hernando Reali: [name] Treating Shadavia Dampier/Extender: Tito Dine in Treatment: 2 Wound Status Wound Number: 24 Primary Diabetic Wound/Ulcer of the Lower Extremity Etiology: Wound Location: Right Toe Second - Distal Wound Open Wounding Event: Gradually Appeared Status: Date Acquired: 03/08/2019 Comorbid Anemia, Lymphedema, Chronic Obstructive Weeks Of Treatment: 2 History: Pulmonary Disease (COPD), Sleep Apnea, Clustered Wound: No Congestive Heart Failure, Coronary Artery Disease, Hypertension, Peripheral Venous Disease, Type II Diabetes, Osteoarthritis, Neuropathy, Received Chemotherapy Photos Wound Measurements Length: (cm) 0.9 % Reduction Width: (cm) 0.5 % Reduction Depth: (cm) 0.2 Epithelializ Area: (cm) 0.353 Tunneling: Volume: (cm) 0.071 Undermining in Area: 54.6% in Volume: 9% ation: Small (1-33%) No : No Wound Description Classification: Grade 2 Foul Odor A Wound Margin: Flat and Intact Slough/Fibr Exudate Amount: Medium Exudate Type: Serous Exudate Color: amber fter Cleansing: No ino Yes Wound Bed Granulation Amount: Medium (34-66%) Exposed Structure Granulation Quality: Red Fascia Exposed: No Necrotic Amount: Small (1-33%) Fat Layer (Subcutaneous Tissue) Exposed: Yes Necrotic Quality: Adherent Slough Tendon Exposed:  No Muscle Exposed: No Joint Exposed: No Bone Exposed: No Adam Merritt, Adam Merritt ([id number]) Treatment Notes Wound #24 (Right, Distal Toe Second) Notes Silver cell to toe and left leg, conform to toe, Electronic Signature(s) Signed: 04/04/2019 2:11:27 PM By: Army Melia Entered By: Army Melia on 04/04/2019 09:30:12 Adam Merritt ([id number]) -------------------------------------------------------------------------------- Wound Assessment Details Patient Name: Adam Merritt Date of Service: 04/04/2019 9:15 AM Medical Record Number: [id number] Patient Account Number: [id number] Date of Birth/Sex: [birth year] (73 y.o. M) Treating RN: Montey Hora Primary Care Dashonda Bonneau: [name] Other Clinician: Referring Peightyn Roberson: [name] Treating Janes Colegrove/Extender: Tito Dine in Treatment: 2 Wound Status Wound Number: 25 Primary Diabetic Wound/Ulcer of the Lower Extremity Etiology: Wound Location: Left, Anterior Lower Leg Wound Healed - Epithelialized Wounding Event: Gradually Appeared Status: Date Acquired: 03/08/2019 Comorbid Anemia, Lymphedema, Chronic Obstructive Weeks Of Treatment: 2 History: Pulmonary Disease (COPD), Sleep Apnea, Clustered Wound: No Congestive Heart Failure, Coronary Artery Disease, Hypertension, Peripheral Venous Disease, Type II Diabetes, Osteoarthritis, Neuropathy, Received Chemotherapy Photos Wound Measurements Length: (cm) 0 Width: (cm) 0 Depth: (cm) 0 Area: (cm) 0 Volume: (cm) 0 % Reduction in Area: 100% % Reduction in Volume: 100% Epithelialization: Large (67-100%) Tunneling: No Undermining: No Wound Description Classification: Grade 2 Wound Margin: Flat and Intact Exudate Amount: None Present Wound Bed Granulation Amount: None Present (0%) Exposed Structure Necrotic Amount: Large (67-100%) Fascia Exposed: No Necrotic Quality: Adherent Slough Fat Layer (Subcutaneous Tissue) Exposed: No Tendon Exposed: No Muscle Exposed:  No Joint Exposed: No Bone Exposed: No Adam Merritt, Adam Merritt ([id number]) Electronic Signature(s) Signed: 04/04/2019 5:07:31 PM  By: Montey Hora Entered By: Montey Hora on 04/04/2019 09:50:21 Adam Merritt ([id number]) -------------------------------------------------------------------------------- [place] Details Patient Name: Adam Merritt Date of Service: 04/04/2019 9:15 AM Medical Record Number: [id number] Patient Account Number: [id number] Date of Birth/Sex: 1946/02/18 (73 y.o. M) Treating RN: Army Melia Primary Care Ercil Cassis: [name] Other Clinician: Referring Yuko Coventry: [name] Treating Steffanie Mingle/Extender: Tito Dine in Treatment: 2 Vital Signs Time Taken: 09:21 Temperature (F): 98.4 Height (in): 66 Pulse (bpm): 83 Weight (lbs): 297 Respiratory Rate (breaths/min): 16 Body Mass Index (BMI): 47.9 Blood Pressure (mmHg): 140/73 Reference Range: 80 - 120 mg / dl Electronic Signature(s) Signed: 04/04/2019 2:11:27 PM By: Army Melia Entered By: Army Melia on 04/04/2019 09:21:34

## 2019-04-04 NOTE — Progress Notes (Signed)
DANNER, PAULDING ([id number]) Visit Report for 04/04/2019 HPI Details Patient Name: Adam Merritt, Adam Merritt Date of Service: 04/04/2019 9:15 AM Medical Record Number: [id number] Patient Account Number: [id number] Date of Birth/Sex: [birth year] (73 y.o. M) Treating RN: Montey Hora Primary Care Provider: [name] Other Clinician: Referring Provider: [name] Treating Provider/Extender: Tito Dine in Treatment: 2 History of Present Illness HPI Description: 10/18/17-He is here for initial evaluation of bilateral lower extremity ulcerations in the presence of venous insufficiency and lymphedema. He has been seen by vascular medicine in the past, Dr. Lucky Cowboy, last seen in 2016. He does have a history of abnormal ABIs, which is to be expected given his lymphedema and venous insufficiency. According to Epic, it appears that all attempts for arterial evaluation and/or angiography were not follow through with by patient. He does have a history of being seen in lymphedema clinic in 2018, stopped going approximately 6 months ago stating "it didn't do any good". He does not have lymphedema pumps, he does not have custom fit compression wrap/stockings. He is diabetic and his recent A1c last month was 7.6. He admits to chronic bilateral lower extremity pain, no change in pain since blister and ulceration development. He is currently being treated with Levaquin for bronchitis. He has home health and we will continue. 10/25/17-He is here in follow-up evaluation for bilateral lower extremity ulcerationssubtle he remains on Levaquin for bronchitis. Right lower extremity with no evidence of drainage or ulceration, persistent left lower extremity ulceration. He states that home health has not been out since his appointment. He went to [facility] on Tuesday, studies revealed: RIGHT ABI 0.9, TBI 0.6 LEFT ABI 1.1, TBI 0.6 with triphasic flow bilaterally. We will continue his same treatment plan.  He has been educated on compression therapy and need for elevation. He will benefit from lymphedema pumps 11/01/17-He is here in follow-up evaluation for left lower extremity ulcer. The right lower extremity remains healed. He has home health services, but they have not been out to see the patient for 2-3 weeks. He states it home health physical therapy changed his dressing yesterday after therapy; he placed Ace wrap compression. We are still waiting for lymphedema pumps, reordered d/t need for company change. 11/08/17-He is here in follow-up evaluation for left lower extremity ulcer. It is improved. Edema is significantly improved with compression therapy. We will continue with same treatment plan and he will follow-up next week. No word regarding lymphedema pumps 11/15/17-He is here in follow-up evaluation for left lower extremity ulcer. He is healed and will be discharged from wound care services. I have reached out to [facility] regarding his lymphedema pumps. They have been unable to reach the patient; the contact number they had with the patient's wife's cell phone and she has not answered any unrecognized calls. Contact should be made today, trial planned for next week; [facility] will continue to follow 11/27/17 on evaluation today patient has multiple blistered areas over the right lower extremity his left lower extremity appears to be doing okay. These blistered areas show signs of no infection which is great news. With that being said he did have some necrotic skin overlying which was mechanically debrided away with saline and gauze today without complication. Overall post debridement the wounds appear to be doing better but in general his swelling seems to be increased. This is obviously not good news. I think this is what has given rise to the blisters. 12/04/17 on evaluation today patient presents for  follow-up concerning his bilateral lower extremity edema in the right  lower extremity ulcers. He has been tolerating the dressing changes without complication. With that being said he has had no real issues with the wraps which is also good news. Overall I'm pleased with the progress he's been making. 12/11/17 on evaluation today patient appears to be doing rather well in regard to his right lateral lower extremity ulcer. He's been tolerating the dressing changes without complication. Fortunately there does not appear to be any evidence of infection at this time. Overall I'm pleased with the progress that is being made. Unfortunately he has been in the hospital due to having what sounds to be a stomach virus/flu fortunately that is starting to get better. 12/18/17 on evaluation today patient actually appears to be doing very well in regard to his bilateral lower extremities the swelling is under fairly good control his lymphedema pumps are still not up and running quite yet. With that being said he does have several areas of opening noted as far as wounds are concerned mainly over the left lower extremity. With that ADRON, GEISEL ([id number]) being said I do believe once he gets lymphedema pumps this would least help you mention some the fluid and preventing this from occurring. Hopefully that will be set up soon sleeves are Artie in place at his home he just waiting for the machine. 12/25/17 on evaluation today patient actually appears to be doing excellent in fact all of his ulcers appear to have resolved his legs appear very well. I do think he needs compression stockings we have discussed this and they are actually going to go to [place] today to elastic therapy to get this fitted for him. I think that is definitely a good thing to do. Readmission: 04/09/18 upon evaluation today this patient is seen for readmission due to bilateral lower extremity lymphedema. He has significant swelling of his extremities especially on the left although the right is also swollen he  has weeping from both sides. There are no obvious open wounds at this point. Fortunately he has been doing fairly well for quite a bit of time since I last saw him. Nonetheless unfortunately this seems to have reopened and is giving quite a bit of trouble. He states this began about a week ago when he first called [place] to get in to be seen. No fevers, chills, nausea, or vomiting noted at this time. He has not been using his lymphedema pumps due to the fact that they won't fit on his leg at this point likewise is also not been using his compression for essentially the same reason. 04/16/18 upon evaluation today patient actually appears to be doing a little better in regard to the fluid in his bilateral lower extremities. With that being said he's had three falls since I saw him last week. He also states that he's been feeling very poorly. I was concerned last week and feel like that the concern is still there as far as the congestion in his chest is concerned he seems to be breathing about the same as last week but again he states he's very weak he's not even able to walk further than from the chair to the door. His wife had to buy a wheelchair just to be able to get them out of the house to get to the appointment today. This has me very concerned. 04/23/18 on evaluation today patient actually appears to be doing much better than last week's evaluation. At that  time actually had to transport him to the ER via EMS and he subsequently was admitted for acute pulmonary edema, acute renal failure, and acute congestive heart failure. Fortunately he is doing much better. Apparently they did dialyze him and were able to take off roughly 35 pounds of fluid. Nonetheless he is feeling much better both in regard to his breathing and he's able to get around much better at this time compared to previous. Overall I'm very happy with how things are at this time. There does not appear to be any evidence of infection  currently. No fevers, chills, nausea, or vomiting noted at this time. 04/30/2018 patient seen today for follow-up and management of bilateral lower extremity lymphedema. He did express being more sad today than usual due to the recent loss of his dog. He states that he has been compliant with using the lymphedema pumps. However he does admit a minute over the last 2-3 days he has not been using the pumps due to the recent loss of his dog. At this time there is no drainage or open wounds to his lower extremities. The left leg edema is measuring smaller today. Still has a significant amount of edema on bilateral lower extremities With dry flaky skin. He will be referred to the lymphedema clinic for further management. Will continue 3 layer compression wraps and follow-up in 1-2 weeks.Denies any pain, fever, chairs recently. No recent falls or injuries reported during this visit. 05/07/18 on evaluation today patient actually appears to be doing very well in regard to his lower extremities in general all things considered. With that being said he is having some pain in the legs just due to the amount of swelling. He does have an area where he had a blister on the left lateral lower extremity this is open at this point other than that there's nothing else weeping at this time. 05/14/18 on evaluation today patient actually appears to be doing excellent all things considered in regard to his lower extremities. He still has a couple areas of weeping on each leg which has continued to be the issue for him. He does have an appointment with the lymphedema clinic although this isn't until February 2020. That was the earliest they had. In the meantime he has continued to tolerate the compression wraps without complication. 05/28/18 on evaluation today patient actually appears to be doing more poorly in regard to his left lower extremity where he has a wound open at this point. He also had a fall where he  subsequently injured his right great toe which has led to an open wound at the site unfortunately. He has been tolerating the dressing changes without complication in general as far as the wraps are concerned that he has not been putting any dressing on the left 1st toe ulcer site. 06/11/18 on evaluation today patient appears to be doing much worse in regard to his bilateral lower extremity ulcers. He has been tolerating the dressing changes without complication although his legs have not been wrapped more recently. Overall I am not very pleased with the way his legs appear. I do believe he needs to be back in compression wraps he still has not received his compression wraps from the [facility] hospital as of yet. 06/18/18 on evaluation today patient actually appears to be doing significantly better than last time I saw him. He has been AVERIE, MEINER. ([id number]) tolerating the compression wraps without complication in the circumferential ulcers especially appear to be doing much  better. His toe ulcer on the right in regard to the great toe is better although not as good as the legs in my pinion. No fevers chills noted 07/02/18 on evaluation today patient appears to be doing much better in regard to his lower extremity ulcers. Unfortunately since I last saw him he's had the distal portion of his right great toe if he dated it sounds as if this actually went downhill very quickly. I had only seen him a few days prior and the toe did not appear to be infected at that point subsequently became infected very rapidly and it was decided by the surgeon that the distal portion of the toe needed to be removed. The patient seems to be doing well in this regard he tells me. With that being said his lower extremities are doing better from the standpoint of the wounds although he is significantly swollen at this point. 07/09/18 on evaluation today patient appears to be doing better in regard to the wounds on his lower  extremities. In fact everything is almost completely healed he is just a small area on the left posterior lower extremity that is open at this point. 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 With that being said he needs some kind of compression it does sound like he may not be wearing his compression, that is the wraps, during the entire time between when he's here visit to visit. Apparently his wife took the current one off because it began to "fall apart". 07/16/18 on evaluation today patient appears to be extremely swollen especially in regard to his left lower extremity unfortunately. He also has a new skin tear over the left lower extremity and there's a smaller area on the right lower extremity as well. Unfortunately this seems to be due in part to blistering and fluid buildup in his leg. He did get the reduction wraps that were ordered by the [facility] hospital for him to go to lymphedema clinic. With that being said his wounds on the legs have not healed to the point to where they would likely accept them as a patient lymphedema clinic currently. We need to try to get this to heal. With that being said he's been taking his wraps off which is not doing him any favors at this point. In fact this is probably quite counterproductive compared to what needs to occur. We will likely need to increase to a four layer compression wrap and continue to also utilize elevation and he has to keep the wraps on not take them off as he's been doing currently hasn't had a wrap on since Saturday. 07/23/18 on evaluation today patient appears to be doing much better in regard to his bilateral lower extremities. In fact his left lower extremity which was the largest is actually 15 cm smaller today compared to what it was last time he was  here in our clinic. This is obviously good news after just one week. Nonetheless the differences he actually kept the wraps on during the entire week this time. That's not typical for him. I do believe he understands a little bit better now the severity of the situation and why it's important for him to keep these wraps on. 07/30/18 on evaluation today patient actually appears to be doing rather well in regard to his lower  extremities. His legs are much smaller than they have been in the past and he actually has only one very small rudder superficial region remaining that is not closed on the left lateral/posterior lower extremity even this is almost completely close. I do believe likely next week he will be healed without any complications. I do think we need to continue the wraps however this seems to be beneficial for him. I also think it may be a good time for [place] to go ahead and see about getting the appointment with the lymphedema clinic which is supposed to be made for him in order to keep this moving along and hopefully get them into compression wraps that will in the end help him to remain healed. 08/06/18 on evaluation today patient actually appears to be doing very well in regard as bilateral lower extremities. In fact his wounds appear to be completely healed at this time. 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 He is in the process of getting appointment with the lymphedema clinic we have made this referral were just waiting to hear back on the schedule time. We need to follow up on that today as well. 08/13/18 on evaluation today patient actually appears to be doing very well in regard to his bilateral lower extremities there are no open wounds at this point. We are gonna go ahead and see about ordering the Velcro compression wraps for him had a discussion with them about [place] doing it versus the [place] they feel like they can  definitely afford going ahead and get the wraps themselves and they would prefer to try to avoid having to go to the lymphedema clinic if it all possible which I completely understand. As long as he has good compression I'm okay either way. 3/17//20 on evaluation today patient actually appears to be doing well in regard to his bilateral lower extremity ulcers. He has been tolerating the dressing changes without complication specifically the compression wraps. Overall is had no issues my fingers finding that I see at this point is that he is having trouble with constipation. He tells me he has not been able to go to the bathroom for about six days. He's taken to over-the-counter oral laxatives unfortunately this is not helping. He has not contacted his doctor. 08/27/18 on evaluation today patient appears to be doing fairly well in regard to his lower extremities at this point. There does BRAELON, SPRUNG. ([id number]) not appear to be any new altars is swelling is very well controlled. We are still waiting for his Velcro compression wraps to arrive that should be sometime in the next week is Artie sent the check for these. 09/03/18 on evaluation today patient appears to be doing excellent in regard to his bilateral lower extremities which he shows no signs of wound openings in regard to this point. He does have his Velcro compression wraps which did arrive in the mail since I saw him last week. Overall he is doing excellent in my pinion. 09/13/18 on evaluation today patient appears to be doing very well currently in regard to the overall appearance of his bilateral lower extremities although he's a little bit more swollen than last time we saw him. At that point he been discharged without any open wounds. Nonetheless he has a small open wound on the posterior left lower extremity with some evidence of cellulitis noted as well. Fortunately I feel like he has made good progress overall with  regard to his  lower extremities from were things used to be. 09/20/18 on evaluation today patient actually appears to be doing much better. The erythematous lower extremity is improving wound itself which is still open appears to be doing much better as far as but appearance as well as pain is concerned overall very pleased in this regard. There's no signs of active infection at this time. 09/27/18 on evaluation today patient's wounds on the lower extremity actually appear to be doing fairly well at this time which is good news. There is no evidence of active infection currently and again is just as left lower extremity were there any wounds at this point anyway. I believe they may be completely healed but again I'm not 100% sure based on evaluation today. I think one more week of observation would likely be a good idea. 10/04/18 on evaluation today patient actually appears to be doing excellent in regard to the left lower extremity which actually appears to be completely healed as of today. Unfortunately he's been having issues with his right lower extremity have a new wound that has opened. Fortunately there's no evidence of active infection at this time which is good news. 10/10/18 upon evaluation today patient actually appears to be doing a little bit worse with the new open area on his right posterior lower extremity. He's been tolerating the dressing changes without complication. Right now we been using his compression wraps although I think we may need to switch back to actually performing bilateral compression wraps are in the clinic. No fevers, chills, nausea, or vomiting noted at this time. 10/17/18 on evaluation today patient actually appears to be doing quite well in regard to his bilateral lower extremity ulcers. He has been tolerating the reps without complication although he would prefer not to rewrap his legs as of today. Fortunately there's no signs of active infection which is good news. No fevers,  chills, nausea, or vomiting noted at this time. 10/24/18 on evaluation today patient appears to be doing very well in regard to his lower extremities. His right lower extremity is shown signs of healing and his left lower [place] though not healed appears to be improving which is excellent news. Overall very pleased with how things seem to be progressing at this time. The patient is likewise happy to hear this. His Velcro compression wraps however have not been put on properly will gonna show his wife how to do that properly today. 10/31/18 on evaluation today patient appears to be doing more poorly in regard to his left lower extremity in particular although both lower extremities actually are showing some signs of being worse than my previous evaluation. Unfortunately I'm just not sure that his compression stockings even with the use of the compression/lymphedema pumps seem to be controlling this well. Upon further questioning he tells me that he also is not able to lie flat in the bed due to his congestive heart failure and difficulty breathing. For that reason he sleeps in his recliner. To make matters worse his recliner also cannot even hold his legs up so instead of even being somewhat elevated they pretty much hang down to the floor. This is the way he sleeps each night which is definitely counterproductive to everything else that were attempting to do from the standpoint of controlling his fluid. Nonetheless I think that he potentially could benefit from a hospital bed although this would be something that his primary care provider would likely have to order since anything  that is order on our side has to be directly related to wound care and again the hospital bed is not necessarily a direct relation to although I think it does contribute to his overall wound status on his lower extremities. 11/07/18 on evaluation today patient appears to be doing better in regard to his bilateral lower  extremity. He's been tolerating the dressing changes without complication. We did in the interim since I last saw him switch to just using extras orbiting new alginate and that seems to have done much better for him. I'm very pleased with the overall progress that is made. 11/14/18 on evaluation today patient appears to be redoing rather well in regard to his left lower extremity ulcers which are the only ones remaining at this point. Fortunately there's no signs of active infection at this time which is good news. Overall been very pleased with how things seem to be progressing currently. No fevers, chills, nausea, or vomiting noted at this time. LAWRANCE, WIEDEMANN ([id number]) 11/20/17 on evaluation today patient actually appears to be doing quite well in regard to his lower extremities on the left on the right he has several blisters that showed up although there's some question about whether or not he has had his broker compression wraps on like he was supposed to or not. Fortunately there's no signs of active infection at this time which is good news. Unfortunately though he is doing well from the standpoint of the left leg the right leg is not doing as well again this is when we did not wrap last week. 11/28/18 on evaluation today patient appears to be doing well in regard to his bilateral lower extremities is left appears to be healed is right is not healed but is very close to being so. Overall very pleased with how things seem to be progressing. Patient is likewise happy that things are doing well. 12/09/18 on evaluation today patient actually appears to be completely healed which is excellent news. He actually seems to be doing well in regard to the swelling of the bilateral lower extremities which is also great news. Overall very pleased with how things seem to be progressing. Readmission: 03/18/2019 upon evaluation today patient presents for reevaluation concerning issues that he is having with  his left lower extremity where he does have a wound noted upon inspection today. He also has a wound on the right second toe on the tip where it is apparently been rubbing in his shoe I did look at issues and you can actually see where this has been occurring as well. Fortunately there is no signs of infection with regard to the toe necessarily although it is very swollen compared to normal that does have me somewhat concerned about the possibility of further evaluation for infection/osteomyelitis. I would recommend an x-ray to start with. Otherwise he states that it is been 1-2 weeks that he has had the draining in regard to left lower extremity he does not know how this happened he has been wearing his compression appropriately and I do see that as well today but nonetheless I do think that he needs to continue to be very cautious with regard to elevation as well. 03/25/2019 on evaluation today patient appears to be doing much better in regard to his lower extremity at this time. That is on the left. He did culture positive for Pseudomonas but the good news is he seems to be doing much better the gentamicin we applied topically seems to have  done a great job for him. There is no signs of active infection at this time systemically and locally he is doing much better. No fevers, chills, nausea, vomiting, or diarrhea. In regard to his right toe this seems to be doing much better there is very little pressure noted at this point I did clean up some of the callus today around the edges of the wound as well as the surface of the wound but to be honest he is progressing quite nicely. 10/30; the area on the left anterior lower tibia area is healed over. His edema control is adequate even though his use of the compression pumps seems very intermittent. He has a juxta lite stocking on the right leg and a think there is 1 for the left leg at home. His wife is putting these on. He has an area on the plantar  right second toe which is a hammertoe they are trying to offload this Electronic Signature(s) Signed: 04/04/2019 5:42:32 PM By: Linton Ham MD Entered By: Linton Ham on 04/04/2019 10:10:10 [name] ([id number]) -------------------------------------------------------------------------------- Physical Exam Details Patient Name: [name] Date of Service: 04/04/2019 9:15 AM Medical Record Number: [id number] Patient Account Number: [id number] Date of Birth/Sex: 11/21/1945 (73 y.o. M) Treating RN: Montey Hora Primary Care Provider: [name] Other Clinician: Referring Provider: [name] Treating Provider/Extender: Tito Dine in Treatment: 2 Constitutional Sitting or standing Blood Pressure is within target range for patient.. Pulse regular and within target range for patient.Marland Kitchen Respirations regular, non-labored and within target range.. Temperature is normal and within the target range for the patient.Marland Kitchen appears in no distress. Eyes Conjunctivae clear. No discharge. Respiratory Respiratory effort is easy and symmetric bilaterally. Rate is normal at rest and on room air.. Cardiovascular Palpable pulses. Lymphedema is under control,. Integumentary (Hair, Skin) Changes of hemosiderin deposition. Psychiatric No evidence of depression, anxiety, or agitation. Calm, cooperative, and communicative. Appropriate interactions and affect.. Notes Wound exam; the area on the left anterior tibial area is healed. Small clean wound on the right second toe. Electronic Signature(s) Signed: 04/04/2019 5:42:32 PM By: Linton Ham MD Entered By: Linton Ham on 04/04/2019 10:11:28 [name] ([id number]) -------------------------------------------------------------------------------- Physician Orders Details Patient Name: YARETH, MACDONNELL Date of Service: 04/04/2019 9:15 AM Medical Record Number: [id number] Patient Account Number: [id number] Date of  Birth/Sex: 1945/11/11 (73 y.o. M) Treating RN: Montey Hora Primary Care Provider: [name] Other Clinician: Referring Provider: [name] Treating Provider/Extender: Tito Dine in Treatment: 2 Verbal / Phone Orders: No Diagnosis Coding Wound Cleansing Wound #24 Right,Distal Toe Second o Dial antibacterial soap, wash wounds, rinse and pat dry prior to dressing wounds o May Shower, gently pat wound dry prior to applying new dressing. Primary Wound Dressing Wound #24 Right,Distal Toe Second o Silver Alginate Secondary Dressing Wound #24 Right,Distal Toe Second o Gauze and Kerlix/Conform Dressing Change Frequency Wound #24 Right,Distal Toe Second o Change dressing every other day. Follow-up Appointments Wound #24 Right,Distal Toe Second o Return Appointment in 1 week. Edema Control Wound #24 Right,Distal Toe Second o Patient to wear own Velcro compression garment. o Elevate legs to the level of the heart and pump ankles as often as possible o Compression Pump: Use compression pump on left lower extremity for 60 minutes, twice daily. Electronic Signature(s) Signed: 04/04/2019 5:07:31 PM By: Montey Hora Signed: 04/04/2019 5:42:32 PM By: Linton Ham MD Entered By: Montey Hora on 04/04/2019 09:55:01 [name] ([id number]) -------------------------------------------------------------------------------- Problem List Details Patient Name: MAVERYK, RENSTROM  W. Date of Service: 04/04/2019 9:15 AM Medical Record Number: [id number] Patient Account Number: [id number] Date of Birth/Sex: [birth year] (73 y.o. 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 M) Treating RN: Montey Hora Primary Care Provider: [name] Other Clinician: Referring Provider: [name] Treating Provider/Extender: Tito Dine in Treatment: 2 Subjective History of Present Illness (HPI) 10/18/17-He is here for initial evaluation of bilateral lower extremity ulcerations in the presence of venous insufficiency and lymphedema. He has been seen by vascular medicine in the past, Dr. Lucky Cowboy, last seen in 2016. He does have a history of abnormal ABIs, which is to be expected given his lymphedema and venous insufficiency. According to Epic, it appears that all attempts for arterial evaluation and/or angiography were not follow through with by patient. He does have a history of being seen in lymphedema clinic in 2018, stopped going approximately 6 months ago stating "it didn't do any good". He does not have lymphedema pumps, he does not have custom fit compression wrap/stockings. He is diabetic and his recent A1c last month was 7.6. He admits to chronic bilateral lower extremity pain, no change in pain since blister and ulceration development. He is currently  being treated with Levaquin for bronchitis. He has home health and we will continue. 10/25/17-He is here in follow-up evaluation for bilateral lower extremity ulcerationssubtle he remains on Levaquin for bronchitis. Right lower extremity with no evidence of drainage or ulceration, persistent left lower extremity ulceration. He states that home health has not been out since his appointment. He went to [facility] on Tuesday, studies revealed: RIGHT ABI 0.9, TBI 0.6 LEFT ABI 1.1, TBI 0.6 with triphasic flow bilaterally. We will continue his same treatment plan. He has been educated on compression therapy and need for elevation. He will benefit from lymphedema pumps 11/01/17-He is here in follow-up evaluation for left lower extremity ulcer. The right lower extremity remains healed. He has home health services, but they have not been out to see the patient for 2-3 weeks. He states it home health physical therapy changed his dressing yesterday after therapy; he placed Ace wrap compression. We are still waiting for lymphedema pumps, reordered d/t need for company change. 11/08/17-He is here in follow-up evaluation for left lower extremity ulcer. It is improved. Edema is significantly improved with compression therapy. We will continue with same treatment plan and he will follow-up next week. No word regarding lymphedema pumps 11/15/17-He is here in follow-up evaluation for left lower extremity ulcer. He is healed and will be discharged from wound care services. I have reached out to [facility] regarding his lymphedema pumps. They have been  unable to reach the patient; the contact number they had with the patient's wife's cell phone and she has not answered any unrecognized calls. Contact should be made today, trial planned for next week; [facility] will continue to follow 11/27/17 on evaluation today patient has multiple blistered areas over the right lower extremity his left lower  extremity appears to be doing okay. These blistered areas show signs of no infection which is great news. With that being said he did have some necrotic skin overlying which was mechanically debrided away with saline and gauze today without complication. Overall post debridement the wounds appear to be doing better but in general his swelling seems to be increased. This is obviously not good news. I think this is what has given rise to the blisters. 12/04/17 on evaluation today patient presents for follow-up concerning his bilateral lower extremity edema in the right lower extremity ulcers. He has been tolerating the dressing changes without complication. With that being said he has had no real issues with the wraps which is also good news. Overall I'm pleased with the progress he's been making. 12/11/17 on evaluation today patient appears to be doing rather well in regard to his right lateral lower extremity ulcer. He's been tolerating the dressing changes without complication. Fortunately there does not appear to be any evidence of infection at this time. Overall I'm pleased with the progress that is being made. Unfortunately he has been in the hospital due to having what sounds to be a stomach virus/flu fortunately that is starting to get better. 12/18/17 on evaluation today patient actually appears to be doing very well in regard to his bilateral lower extremities the swelling is under fairly good control his lymphedema pumps are still not up and running quite yet. With that being said he does have several areas of opening noted as far as wounds are concerned mainly over the left lower extremity. With that being said I do believe once he gets lymphedema pumps this would least help you mention some the fluid and preventing this from occurring. Hopefully that will be set up soon sleeves are Artie in place at his home he just waiting for the machine. GURJIT, LOCONTE ([id number]) 12/25/17 on evaluation  today patient actually appears to be doing excellent in fact all of his ulcers appear to have resolved his legs appear very well. I do think he needs compression stockings we have discussed this and they are actually going to go to [facility] today to elastic therapy to get this fitted for him. I think that is definitely a good thing to do. Readmission: 04/09/18 upon evaluation today this patient is seen for readmission due to bilateral lower extremity lymphedema. He has significant swelling of his extremities especially on the left although the right is also swollen he has weeping from both sides. There are no obvious open wounds at this point. Fortunately he has been doing fairly well for quite a bit of time since I last saw him. Nonetheless unfortunately this seems to have reopened and is giving quite a bit of trouble. He states this began about a week ago when he first called [place] to get in to be seen. No fevers, chills, nausea, or vomiting noted at this time. He has not been using his lymphedema pumps due to the fact that they won't fit on his leg at this point likewise is also not been using his compression for essentially the same reason. 04/16/18 upon evaluation today patient actually appears to  be doing a little better in regard to the fluid in his bilateral lower extremities. With that being said he's had three falls since I saw him last week. He also states that he's been feeling very poorly. I was concerned last week and feel like that the concern is still there as far as the congestion in his chest is concerned he seems to be breathing about the same as last week but again he states he's very weak he's not even able to walk further than from the chair to the door. His wife had to buy a wheelchair just to be able to get them out of the house to get to the appointment today. This has me very concerned. 04/23/18 on evaluation today patient actually appears to be doing much better than last  week's evaluation. At that time actually had to transport him to the ER via EMS and he subsequently was admitted for acute pulmonary edema, acute renal failure, and acute congestive heart failure. Fortunately he is doing much better. Apparently they did dialyze him and were able to take off roughly 35 pounds of fluid. Nonetheless he is feeling much better both in regard to his breathing and he's able to get around much better at this time compared to previous. Overall I'm very happy with how things are at this time. There does not appear to be any evidence of infection currently. No fevers, chills, nausea, or vomiting noted at this time. 04/30/2018 patient seen today for follow-up and management of bilateral lower extremity lymphedema. He did express being more sad today than usual due to the recent loss of his dog. He states that he has been compliant with using the lymphedema pumps. However he does admit a minute over the last 2-3 days he has not been using the pumps due to the recent loss of his dog. At this time there is no drainage or open wounds to his lower extremities. The left leg edema is measuring smaller today. Still has a significant amount of edema on bilateral lower extremities With dry flaky skin. He will be referred to the lymphedema clinic for further management. Will continue 3 layer compression wraps and follow-up in 1-2 weeks.Denies any pain, fever, chairs recently. No recent falls or injuries reported during this visit. 05/07/18 on evaluation today patient actually appears to be doing very well in regard to his lower extremities in general all things considered. With that being said he is having some pain in the legs just due to the amount of swelling. He does have an area where he had a blister on the left lateral lower extremity this is open at this point other than that there's nothing else weeping at this time. 05/14/18 on evaluation today patient actually appears to be  doing excellent all things considered in regard to his lower extremities. He still has a couple areas of weeping on each leg which has continued to be the issue for him. He does have an appointment with the lymphedema clinic although this isn't until February 2020. That was the earliest they had. In the meantime he has continued to tolerate the compression wraps without complication. 05/28/18 on evaluation today patient actually appears to be doing more poorly in regard to his left lower extremity where he has a wound open at this point. He also had a fall where he subsequently injured his right great toe which has led to an open wound at the site unfortunately. He has been tolerating the dressing changes without  complication in general as far as the wraps are concerned that he has not been putting any dressing on the left 1st toe ulcer site. 06/11/18 on evaluation today patient appears to be doing much worse in regard to his bilateral lower extremity ulcers. He has been tolerating the dressing changes without complication although his legs have not been wrapped more recently. Overall I am not very pleased with the way his legs appear. I do believe he needs to be back in compression wraps he still has not received his compression wraps from the [facility] hospital as of yet. 06/18/18 on evaluation today patient actually appears to be doing significantly better than last time I saw him. He has been tolerating the compression wraps without complication in the circumferential ulcers especially appear to be doing much better. His toe ulcer on the right in regard to the great toe is better although not as good as the legs in my pinion. No fevers chills noted BOND, GRIESHOP ([id number]) 07/02/18 on evaluation today patient appears to be doing much better in regard to his lower extremity ulcers. Unfortunately since I last saw him he's had the distal portion of his right great toe if he dated it sounds as if this  actually went downhill very quickly. I had only seen him a few days prior and the toe did not appear to be infected at that point subsequently became infected very rapidly and it was decided by the surgeon that the distal portion of the toe needed to be removed. The patient seems to be doing well in this regard he tells me. With that being said his lower extremities are doing better from the standpoint of the wounds although he is significantly swollen at this point. 07/09/18 on evaluation today patient appears to be doing better in regard to the wounds on his lower extremities. In fact everything is almost completely healed he is just a small area on the left posterior lower extremity that is open at this point. 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 With that being said he needs some kind of compression it does sound like he may not be wearing his compression, that is the wraps, during the entire time between when he's here visit to visit. Apparently his wife took the current one off because it began to "fall apart". 07/16/18 on evaluation today patient appears to be extremely swollen especially in regard to his left lower extremity unfortunately. He also has a new skin tear over the left lower extremity and there's a smaller area on the right lower extremity as well. Unfortunately this seems to be due in part to blistering and fluid buildup in his leg. He did get the reduction wraps that were ordered by the [facility] hospital for him to go to lymphedema clinic. With that being said his wounds on the legs have not healed to the point to where they would likely accept them as a patient lymphedema clinic currently. We need to try to get this to heal. With that being said he's been taking his wraps off which is not doing him  any favors at this point. In fact this is probably quite counterproductive compared to what needs to occur. We will likely need to increase to a four layer compression wrap and continue to also utilize elevation and he  has to keep the wraps on not take them off as he's been doing currently hasn't had a wrap on since Saturday. 07/23/18 on evaluation today patient appears to be doing much better in regard to his bilateral lower extremities. In fact his left lower extremity which was the largest is actually 15 cm smaller today compared to what it was last time he was here in our clinic. This is obviously good news after just one week. Nonetheless the differences he actually kept the wraps on during the entire week this time. That's not typical for him. I do believe he understands a little bit better now the severity of the situation and why it's important for him to keep these wraps on. 07/30/18 on evaluation today patient actually appears to be doing rather well in regard to his lower extremities. His legs are much smaller than they have been in the past and he actually has only one very small rudder superficial region remaining that is not closed on the left lateral/posterior lower extremity even this is almost completely close. I do believe likely next week he will be healed without any complications. I do think we need to continue the wraps however this seems to be beneficial for him. I also think it may be a good time for [place] to go ahead and see about getting the appointment with the lymphedema clinic which is supposed to be made for him in order to keep this moving along and hopefully get them into compression wraps that will in the end help him to remain healed. 08/06/18 on evaluation today patient actually appears to be doing very well in regard as bilateral lower extremities. In fact his wounds appear to be completely healed at this time. He does have bilateral lymphedema which has been extremely  well controlled with the compression wraps. He is in the process of getting appointment with the lymphedema clinic we have made this referral were just waiting to hear back on the schedule time. We need to follow up on that today as well. 08/13/18 on evaluation today patient actually appears to be doing very well in regard to his bilateral lower extremities there are no open wounds at this point. We are gonna go ahead and see about ordering the Velcro compression wraps for him had a discussion with them about [place] doing it versus the [place] they feel like they can definitely afford going ahead and get the wraps themselves and they would prefer to try to avoid having to go to the lymphedema clinic if it all possible which I completely understand. As long as he has good compression I'm okay either way. 3/17//20 on evaluation today patient actually appears to be doing well in regard to his bilateral lower extremity ulcers. He has been tolerating the dressing changes without complication specifically the compression wraps. Overall is had no issues my fingers finding that I see at this point is that he is having trouble with constipation. He tells me he has not been able to go to the bathroom for about six days. He's taken to over-the-counter oral laxatives unfortunately this is not helping. He has not contacted his doctor. 08/27/18 on evaluation today patient appears to be doing fairly well in regard to his lower extremities at this point. There does not appear to be any new altars is swelling is very well controlled. We are still waiting for his Velcro compression wraps to arrive that should be sometime in the next week is Artie sent the check  for these. DAXSON, REFFETT ([id number]) 09/03/18 on evaluation today patient appears to be doing excellent in regard to his bilateral lower extremities which he shows no signs of wound openings in regard to this point. He does have his Velcro compression wraps which did  arrive in the mail since I saw him last week. Overall he is doing excellent in my pinion. 09/13/18 on evaluation today patient appears to be doing very well currently in regard to the overall appearance of his bilateral lower extremities although he's a little bit more swollen than last time we saw him. At that point he been discharged without any open wounds. Nonetheless he has a small open wound on the posterior left lower extremity with some evidence of cellulitis noted as well. Fortunately I feel like he has made good progress overall with regard to his lower extremities from were things used to be. 09/20/18 on evaluation today patient actually appears to be doing much better. The erythematous lower extremity is improving wound itself which is still open appears to be doing much better as far as but appearance as well as pain is concerned overall very pleased in this regard. There's no signs of active infection at this time. 09/27/18 on evaluation today patient's wounds on the lower extremity actually appear to be doing fairly well at this time which is good news. There is no evidence of active infection currently and again is just as left lower extremity were there any wounds at this point anyway. I believe they may be completely healed but again I'm not 100% sure based on evaluation today. I think one more week of observation would likely be a good idea. 10/04/18 on evaluation today patient actually appears to be doing excellent in regard to the left lower extremity which actually appears to be completely healed as of today. Unfortunately he's been having issues with his right lower extremity have a new wound that has opened. Fortunately there's no evidence of active infection at this time which is good news. 10/10/18 upon evaluation today patient actually appears to be doing a little bit worse with the new open area on his right posterior lower extremity. He's been tolerating the dressing changes  without complication. Right now we been using his compression wraps although I think we may need to switch back to actually performing bilateral compression wraps are in the clinic. No fevers, chills, nausea, or vomiting noted at this time. 10/17/18 on evaluation today patient actually appears to be doing quite well in regard to his bilateral lower extremity ulcers. He has been tolerating the reps without complication although he would prefer not to rewrap his legs as of today. Fortunately there's no signs of active infection which is good news. No fevers, chills, nausea, or vomiting noted at this time. 10/24/18 on evaluation today patient appears to be doing very well in regard to his lower extremities. His right lower extremity is shown signs of healing and his left lower [place] though not healed appears to be improving which is excellent news. Overall very pleased with how things seem to be progressing at this time. The patient is likewise happy to hear this. His Velcro compression wraps however have not been put on properly will gonna show his wife how to do that properly today. 10/31/18 on evaluation today patient appears to be doing more poorly in regard to his left lower extremity in particular although both lower extremities actually are showing some signs of being worse than my previous evaluation. Unfortunately  I'm just not sure that his compression stockings even with the use of the compression/lymphedema pumps seem to be controlling this well. Upon further questioning he tells me that he also is not able to lie flat in the bed due to his congestive heart failure and difficulty breathing. For that reason he sleeps in his recliner. To make matters worse his recliner also cannot even hold his legs up so instead of even being somewhat elevated they pretty much hang down to the floor. This is the way he sleeps each night which is definitely counterproductive to everything else that were  attempting to do from the standpoint of controlling his fluid. Nonetheless I think that he potentially could benefit from a hospital bed although this would be something that his primary care provider would likely have to order since anything that is order on our side has to be directly related to wound care and again the hospital bed is not necessarily a direct relation to although I think it does contribute to his overall wound status on his lower extremities. 11/07/18 on evaluation today patient appears to be doing better in regard to his bilateral lower extremity. He's been tolerating the dressing changes without complication. We did in the interim since I last saw him switch to just using extras orbiting new alginate and that seems to have done much better for him. I'm very pleased with the overall progress that is made. 11/14/18 on evaluation today patient appears to be redoing rather well in regard to his left lower extremity ulcers which are the only ones remaining at this point. Fortunately there's no signs of active infection at this time which is good news. Overall been very pleased with how things seem to be progressing currently. No fevers, chills, nausea, or vomiting noted at this time. 11/20/17 on evaluation today patient actually appears to be doing quite well in regard to his lower extremities on the left on the right he has several blisters that showed up although there's some question about whether or not he has had his broker compression wraps on like he was supposed to or not. Fortunately there's no signs of active infection at this time which is BRENDAN, GADSON. ([id number]) good news. Unfortunately though he is doing well from the standpoint of the left leg the right leg is not doing as well again this is when we did not wrap last week. 11/28/18 on evaluation today patient appears to be doing well in regard to his bilateral lower extremities is left appears to be healed is right is  not healed but is very close to being so. Overall very pleased with how things seem to be progressing. Patient is likewise happy that things are doing well. 12/09/18 on evaluation today patient actually appears to be completely healed which is excellent news. He actually seems to be doing well in regard to the swelling of the bilateral lower extremities which is also great news. Overall very pleased with how things seem to be progressing. Readmission: 03/18/2019 upon evaluation today patient presents for reevaluation concerning issues that he is having with his left lower extremity where he does have a wound noted upon inspection today. He also has a wound on the right second toe on the tip where it is apparently been rubbing in his shoe I did look at issues and you can actually see where this has been occurring as well. Fortunately there is no signs of infection with regard to the toe necessarily although it is  very swollen compared to normal that does have me somewhat concerned about the possibility of further evaluation for infection/osteomyelitis. I would recommend an x-ray to start with. Otherwise he states that it is been 1-2 weeks that he has had the draining in regard to left lower extremity he does not know how this happened he has been wearing his compression appropriately and I do see that as well today but nonetheless I do think that he needs to continue to be very cautious with regard to elevation as well. 03/25/2019 on evaluation today patient appears to be doing much better in regard to his lower extremity at this time. That is on the left. He did culture positive for Pseudomonas but the good news is he seems to be doing much better the gentamicin we applied topically seems to have done a great job for him. There is no signs of active infection at this time systemically and locally he is doing much better. No fevers, chills, nausea, vomiting, or diarrhea. In regard to his right toe this  seems to be doing much better there is very little pressure noted at this point I did clean up some of the callus today around the edges of the wound as well as the surface of the wound but to be honest he is progressing quite nicely. 10/30; the area on the left anterior lower tibia area is healed over. His edema control is adequate even though his use of the compression pumps seems very intermittent. He has a juxta lite stocking on the right leg and a think there is 1 for the left leg at home. His wife is putting these on. He has an area on the plantar right second toe which is a hammertoe they are trying to offload this Objective Constitutional Sitting or standing Blood Pressure is within target range for patient.. Pulse regular and within target range for patient.Marland Kitchen Respirations regular, non-labored and within target range.. Temperature is normal and within the target range for the patient.Marland Kitchen appears in no distress. Vitals Time Taken: 9:21 AM, Height: 66 in, Weight: 297 lbs, BMI: 47.9, Temperature: 98.4 F, Pulse: 83 bpm, Respiratory Rate: 16 breaths/min, Blood Pressure: 140/73 mmHg. Eyes Conjunctivae clear. No discharge. Respiratory Respiratory effort is easy and symmetric bilaterally. Rate is normal at rest and on room air.. Cardiovascular CALLOWAY, ANDRUS ([id number]) Palpable pulses. Lymphedema is under control,. Psychiatric No evidence of depression, anxiety, or agitation. Calm, cooperative, and communicative. Appropriate interactions and affect.. General Notes: Wound exam; the area on the left anterior tibial area is healed. Small clean wound on the right second toe. Integumentary (Hair, Skin) Changes of hemosiderin deposition. Wound #24 status is Open. Original cause of wound was Gradually Appeared. The wound is located on the Right,Distal Toe Second. The wound measures 0.9cm length x 0.5cm width x 0.2cm depth; 0.353cm^2 area and 0.071cm^3 volume. There is Fat Layer (Subcutaneous  Tissue) Exposed exposed. There is no tunneling or undermining noted. There is a medium amount of serous drainage noted. The wound margin is flat and intact. There is medium (34-66%) red granulation within the wound bed. There is a small (1-33%) amount of necrotic tissue within the wound bed including Adherent Slough. Wound #25 status is Healed - Epithelialized. Original cause of wound was Gradually Appeared. The wound is located on the Left,Anterior Lower Leg. The wound measures 0cm length x 0cm width x 0cm depth; 0cm^2 area and 0cm^3 volume. There is no tunneling or undermining noted. There is a none present amount of  drainage noted. The wound margin is flat and intact. There is no granulation within the wound bed. There is a large (67-100%) amount of necrotic tissue within the wound bed including Adherent Slough. 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 Primary Wound Dressing: Wound #24 Right,Distal Toe Second: Silver Alginate Secondary Dressing: Wound #24 Right,Distal Toe Second: Gauze and Kerlix/Conform Dressing Change Frequency: Wound #24 Right,Distal Toe Second: Change dressing every other day. Follow-up Appointments: BARTOSZ, LUGINBILL ([id number]) Wound #24 Right,Distal Toe Second: Return Appointment in 1 week. Edema Control: Wound #24 Right,Distal Toe Second: Patient to wear own Velcro compression garment. Elevate legs to the level of the heart and pump ankles as often as possible Compression Pump: Use  compression pump on left lower extremity for 60 minutes, twice daily. 1. I have asked him to put his own stocking on the left leg and to use the compression pumps once daily. 2. Silver alginate to the right second toe. Electronic Signature(s) Signed: 04/04/2019 5:42:32 PM By: Linton Ham MD Entered By: Linton Ham on 04/04/2019 10:12:23 [name] ([id number]) -------------------------------------------------------------------------------- [facility] Details Patient Name: [name] Date of Service: 04/04/2019 Medical Record Number: [id number] Patient Account Number: [id number] Date of Birth/Sex: [birth year] (73 y.o. M) Treating RN: Montey Hora Primary Care Provider: [name] Other Clinician: Referring Provider: [name] Treating Provider/Extender: Tito Dine in Treatment: 2 Diagnosis Coding ICD-10 Codes Code Description I89.0 Lymphedema, not elsewhere classified E11.621 Type 2 diabetes mellitus with foot ulcer L97.512 Non-pressure chronic ulcer of other part of right foot with fat layer exposed L97.822 Non-pressure chronic ulcer of other part of left lower leg with fat layer exposed E66.09 Other obesity due to excess calories Facility Procedures CPT4 Code: 56256389 Description: 99213 - WOUND CARE VISIT-LEV 3 EST PT Modifier: Quantity: 1 Physician Procedures CPT4 Code Description: [id number] 68115 - WC PHYS LEVEL 3 - EST PT ICD-10 Diagnosis Description I89.0 Lymphedema, not elsewhere classified L97.822 Non-pressure chronic ulcer of other part of left lower leg with L97.512 Non-pressure chronic ulcer of other  part of right foot with fat Modifier: fat layer expos layer exposed Quantity: 1 ed Electronic Signature(s) Signed: 04/04/2019 5:07:31 PM By: Montey Hora Signed: 04/04/2019 5:42:32 PM By: Linton Ham MD Entered By: Montey Hora on 04/04/2019 10:21:22

## 2019-04-07 NOTE — Progress Notes (Signed)
Patient ID: Adam Merritt, male    DOB: 1946-05-08, 73 y.o.   MRN: [id number]  HPI  Adam Merritt is a 73 y/o male with a history of CAD, DM, hyperlipidemia, HTN, stroke, COPD, lymphedema and chronic heart failure.   Echo report from 09/16/16 reviewed and showed an EF of 65-70%.  Went to ED 02/15/2019 for nausea and vomiting where he was treated and released.   He presents today for a follow-up visit with a chief complaint of moderate fatigue upon minimal exertion. He describes this as chronic in nature having been present for several years. He has associated shortness of breath, minimal pedal edema and leg weakness along with this. He denies any difficulty sleeping, dizziness, abdominal distention, palpitations, chest pain or cough. He hasn't been weighing himself daily but does have scales. Says that overall he feels much better than he has.   Past Medical History:  Diagnosis Date  . Anemia   . CAD (coronary artery disease)   . Chest pain   . Coronary artery disease   . Diabetes mellitus without complication ([place])   . Diastolic CHF ([place])   . Esophageal reflux   . Herpes zoster without mention of complication   . Hyperlipidemia   . Hypertension   . Insomnia   . Lumbago   . Lymphedema   . Neuropathy in diabetes ([place])   . Obstructive chronic bronchitis without exacerbation ([place])   . Other malaise and fatigue   . Stroke ([place])   . Varicose veins    Past Surgical History:  Procedure Laterality Date  . 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 CHOLECYSTECTOMY    . CORONARY ARTERY BYPASS GRAFT     Family History  Problem Relation Age of Onset  . Diabetes Mother   . Hyperlipidemia Mother   . Hypertension Mother   . Diabetes Father   . Hyperlipidemia Father   . Hypertension Father   . CAD Father    Social History   Tobacco Use  . Smoking status: Never Smoker  .  Smokeless tobacco: Never Used  Substance Use Topics  . Alcohol use: No    Comment: quit alcohol 30 years ago   No Known Allergies  Prior to Admission medications   Medication Sig Start Date End Date Taking? Authorizing Provider  albuterol (PROVENTIL HFA;VENTOLIN HFA) 108 (90 Base) MCG/ACT inhaler INHALE 1 PUFF INTO THE LUNGS EVERY 6 HOURS AS NEEDED FOR WHEEZING OR SHORTNESS OF BREATH Patient taking differently: Inhale 1 puff into the lungs every 6 (six) hours as needed for wheezing or shortness of breath.  03/11/18  Yes Allyne Gee, MD  aspirin 325 MG EC tablet Take 325 mg by mouth daily.   Yes [provider]  atorvastatin (LIPITOR) 40 MG tablet Take 40 mg by mouth at bedtime.    Yes [provider]  carvedilol (COREG) 25 MG tablet Take 25 mg by mouth 2 (two) times daily with a meal.   Yes [provider]  docusate sodium (COLACE) 50 MG capsule Take 50 mg by mouth 2 (two) times daily.   Yes [provider]  febuxostat (ULORIC) 40 MG tablet Take 1 tablet (40 mg total) by mouth daily. 03/17/19  Yes Boscia, Greer Ee, NP  furosemide (LASIX) 40 MG tablet Take 1 tablet (40 mg total) by mouth daily. 02/03/19  Yes Ronnell Freshwater, NP  gabapentin (NEURONTIN) 300 MG capsule Take 2 cap in the morning, 1 cap in the evening and 2 cap at night. 03/27/19  Yes Ronnell Freshwater, NP  hydrALAZINE (APRESOLINE) 25 MG tablet  09/13/18  Yes [provider]  indapamide (LOZOL) 2.5 MG tablet Take 1 tablet (2.5 mg total) by mouth daily. 03/06/19  Yes Hackney, Otila Kluver A, FNP  Insulin Glargine (BASAGLAR KWIKPEN) 100 UNIT/ML SOPN INJECT 30 TO 36 UNITS UNDER THE SKIN EVERY DAY 04/02/19  Yes Boscia, Heather E, NP  Insulin Pen Needle (BD PEN NEEDLE NANO U/F) 32G X 4 MM MISC Use as directed with insulin DX e11.65 03/25/19  Yes Boscia, Heather E, NP  ipratropium-albuterol (DUONEB) 0.5-2.5 (3) MG/3ML SOLN Take 3 mLs by nebulization every 4 (four) hours as needed (for shortness of  breath). 09/10/17  Yes [name], MD  metolazone (ZAROXOLYN) 2.5 MG tablet  09/20/18  Yes [provider]  Multiple Vitamin (MULTIVITAMIN WITH MINERALS) TABS tablet Take 1 tablet by mouth daily.   Yes [provider]  ondansetron (ZOFRAN) 4 MG tablet Take 1 tablet (4 mg total) by mouth every 8 (eight) hours as needed. 02/15/19  Yes Nance Pear, MD  OneTouch Delica Lancets 74Q MISC Use twice daily diag e11.65 03/24/19  Yes Ronnell Freshwater, NP  pentoxifylline (TRENTAL) 400 MG CR tablet TAKE 1 TABLET BY MOUTH THREE TIMES DAILY FOR CLAUDICATION 07/29/18  Yes Boscia, Heather E, NP  potassium chloride SA (K-DUR) 20 MEQ tablet Take 1 tablet (20 mEq total) by mouth 3 (three) times daily. Patient taking differently: Take 20 mEq by mouth 2 (two) times daily.  10/08/18  Yes Hackney, Otila Kluver A, FNP  prazosin (MINIPRESS) 2 MG capsule Take 2 mg by mouth at bedtime.  10/17/18  Yes [provider]  sennosides-docusate sodium (SENOKOT-S) 8.6-50 MG tablet Take 1-2 tablets by mouth 2 (two) times daily.   Yes [provider]  sertraline (ZOLOFT) 100 MG tablet  10/17/18  Yes [provider]  traZODone (DESYREL) 50 MG tablet Take 50 mg by mouth at bedtime.   Yes [provider]  VICTOZA 18 MG/3ML SOPN INJECT 1.8MG  INTO THE SKIN DAILY 08/23/18  Yes Ronnell Freshwater, NP    Review of Systems  Constitutional: Positive for fatigue (tire easily). Negative for appetite change.  HENT: Positive for hearing loss. Negative for congestion and sore throat.   Eyes: Negative.   Respiratory: Positive for shortness of breath (with minimal exertion). Negative for chest tightness.   Cardiovascular: Positive for leg swelling (minimal). Negative for chest pain and palpitations.  Gastrointestinal: Negative for abdominal distention and abdominal pain.  Endocrine: Negative.   Genitourinary: Negative.   Musculoskeletal: Negative for back pain and neck pain.  Skin: Negative for wound.   Allergic/Immunologic: Negative.   Neurological: Positive for weakness (in legs). Negative for dizziness and light-headedness.  Hematological: Negative for adenopathy. Does not bruise/bleed easily.  Psychiatric/Behavioral: Negative for dysphoric mood and sleep disturbance (sleeping on 2 pillows; oxgyen on 4L at bedtime although hasn't been wearing ). The patient is not nervous/anxious.    Vitals:   04/10/19 1150  BP: 136/87  Pulse: 69  Resp: 18  SpO2: 94%  Weight: 278 lb (126.1 kg)  Height: 5\' 6"  (1.676 m)   Wt Readings from Last 3 Encounters:  04/10/19 278 lb (126.1 kg)  04/10/19 291 lb 12.8 oz (132.4 kg)  04/03/19 290 lb (131.5 kg)   Lab Results  Component Value Date   CREATININE 2.76 (H) 03/18/2019  CREATININE 1.93 (H) 02/14/2019   CREATININE 1.73 (H) 06/25/2018    Physical Exam Vitals signs and nursing note reviewed.  Constitutional:      Appearance: He is well-developed.  HENT:     Head: Normocephalic and atraumatic.  Neck:     Musculoskeletal: Normal range of motion and neck supple.  Cardiovascular:     Rate and Rhythm: Normal rate and regular rhythm.  Pulmonary:     Effort: Pulmonary effort is normal. No respiratory distress.     Breath sounds: No wheezing or rales.  Abdominal:     General: There is no distension.     Palpations: Abdomen is soft.  Musculoskeletal:     Right lower leg: He exhibits no tenderness. Edema (lymphedema) present.     Left lower leg: He exhibits no tenderness. Edema (lymphedema) present.  Skin:    General: Skin is warm and dry.  Neurological:     Mental Status: He is alert and oriented to person, place, and time.  Psychiatric:        Behavior: Behavior normal.    Assessment & Plan:  1: Chronic heart failure with preserved ejection fraction- - NYHA class III - euvolemic today although does have chronic lymphedema - not weighing himself at home; reviewed the importance of weighing daily first thing in the morning after using  the bathroom, write the weight down and call for an overnight weight gain of >2 pounds or a weekly weight gain of >5 pounds - weight down 4 pounds from last visit 6 weeks ago - adds salt "on occasion" but does eat out almost every meal because they don't like to cook.  - saw cardiology Lyndel Pleasure) 03/07/18, he states he will make a follow-up appointment  - BNP 04/16/18 was 29.0  2: HTN- - BP looks good today - saw PCP Junius Creamer) 04/10/2019  - BMP from 02/14/2019 reviewed and showed sodium 136, potassium 3.8, creatinine 1.93 and GFR 33  3: DM- - A1c 12/17/2018 was 6.2% - Has not been checking sugar at home  4: COPD-  - supposed to be wearing oxygen at 4L at bedtime but doesn't wear it every night; encouraged him to wear it every night when he goes to bed - saw pulmonologist Humphrey Rolls) 04/03/2019  5: Lymphedema- - follows at the wound center and was last seen 04/08/2019 - legs are currently wrapped - has been elevating his legs more than he used to   Patient did not bring his medications nor a list. Each medication was verbally reviewed with the patient and he was encouraged to bring the bottles to every visit to confirm accuracy of list.  Return in 6 months or sooner for any questions/problems before then.

## 2019-04-08 ENCOUNTER — Other Ambulatory Visit: Payer: Self-pay

## 2019-04-08 ENCOUNTER — Encounter: Payer: Medicare Other | Attending: Physician Assistant | Admitting: Physician Assistant

## 2019-04-08 DIAGNOSIS — E11622 Type 2 diabetes mellitus with other skin ulcer: Secondary | ICD-10-CM | POA: Insufficient documentation

## 2019-04-08 DIAGNOSIS — M1990 Unspecified osteoarthritis, unspecified site: Secondary | ICD-10-CM | POA: Diagnosis not present

## 2019-04-08 DIAGNOSIS — I509 Heart failure, unspecified: Secondary | ICD-10-CM | POA: Insufficient documentation

## 2019-04-08 DIAGNOSIS — D649 Anemia, unspecified: Secondary | ICD-10-CM | POA: Diagnosis not present

## 2019-04-08 DIAGNOSIS — E1140 Type 2 diabetes mellitus with diabetic neuropathy, unspecified: Secondary | ICD-10-CM | POA: Diagnosis not present

## 2019-04-08 DIAGNOSIS — J449 Chronic obstructive pulmonary disease, unspecified: Secondary | ICD-10-CM | POA: Insufficient documentation

## 2019-04-08 DIAGNOSIS — L97909 Non-pressure chronic ulcer of unspecified part of unspecified lower leg with unspecified severity: Secondary | ICD-10-CM | POA: Diagnosis not present

## 2019-04-08 DIAGNOSIS — I110 Hypertensive heart disease with heart failure: Secondary | ICD-10-CM | POA: Diagnosis not present

## 2019-04-08 DIAGNOSIS — G4730 Sleep apnea, unspecified: Secondary | ICD-10-CM | POA: Diagnosis not present

## 2019-04-08 DIAGNOSIS — Z9221 Personal history of antineoplastic chemotherapy: Secondary | ICD-10-CM | POA: Insufficient documentation

## 2019-04-08 NOTE — Progress Notes (Addendum)
AYAZ, SONDGEROTH ([id number]) Visit Report for 04/08/2019 Chief Complaint Document Details Patient Name: DEMONT, LINFORD Date of Service: 04/08/2019 3:45 PM Medical Record Number: [id number] Patient Account Number: [id number] Date of Birth/Sex: 13-Aug-1945 (73 y.o. M) Treating RN: Montey Hora Primary Care Provider: [name] Other Clinician: Referring Provider: [name] Treating Provider/Extender: Melburn Hake, [name]: 3 Information Obtained from: Patient Chief Complaint Right toe and left LE Ulcer Electronic Signature(s) Signed: 04/08/2019 4:21:09 PM By: Worthy Keeler PA-C Entered By: Worthy Keeler on 04/08/2019 16:21:08 [name] ([id number]) -------------------------------------------------------------------------------- Debridement Details Patient Name: [name] Date of Service: 04/08/2019 3:45 PM Medical Record Number: [id number] Patient Account Number: [id number] Date of Birth/Sex: 16-Apr-1946 (73 y.o. M) Treating RN: Montey Hora Primary Care Provider: [name] Other Clinician: Referring Provider: [name] Treating Provider/Extender: Melburn Hake, [name]: 3 Debridement Performed for Wound #24 Right,Distal Toe Second Assessment: Performed By: Physician STONE III, HOYT E., PA-C Debridement Type: Debridement Severity of Tissue Pre Fat layer exposed Debridement: Level of Consciousness (Pre- Awake and Alert procedure): Pre-procedure Verification/Time Yes - 16:23 Out Taken: Start Time: 16:23 Pain Control: Lidocaine 4% Topical Solution Total Area Debrided (L x W): 0.6 (cm) x 0.4 (cm) = 0.24 (cm) Tissue and other material Viable, Non-Viable, Slough, Subcutaneous, Slough debrided: Level: Skin/Subcutaneous Tissue Debridement Description: Excisional Instrument: Curette Bleeding: Minimum Hemostasis Achieved: Pressure End Time: 16:27 Procedural Pain: 0 Post Procedural Pain: 0 Response to Treatment: Procedure was  tolerated well Level of Consciousness Awake and Alert (Post-procedure): Post Debridement Measurements of Total Wound Length: (cm) 0.6 Width: (cm) 0.4 Depth: (cm) 0.4 Volume: (cm) 0.075 Character of Wound/Ulcer Post Debridement: Improved Severity of Tissue Post Debridement: Fat layer exposed Post Procedure Diagnosis Same as Pre-procedure Electronic Signature(s) Signed: 04/08/2019 4:57:37 PM By: Montey Hora Signed: 04/10/2019 6:06:55 PM By: Worthy Keeler PA-C Entered By: Montey Hora on 04/08/2019 16:27:52 [name] ([id number]) -------------------------------------------------------------------------------- HPI Details Patient Name: [name] Date of Service: 04/08/2019 3:45 PM Medical Record Number: [id number] Patient Account Number: [id number] Date of Birth/Sex: Dec 21, 1945 (73 y.o. M) Treating RN: Montey Hora Primary Care Provider: [name] Other Clinician: Referring Provider: [name] Treating Provider/Extender: Melburn Hake, [name]: 3 History of Present Illness HPI Description: 10/18/17-He is here for initial evaluation of bilateral lower extremity ulcerations in the presence of venous insufficiency and lymphedema. He has been seen by vascular medicine in the past, Dr. Lucky Cowboy, last seen in 2016. He does have a history of abnormal ABIs, which is to be expected given his lymphedema and venous insufficiency. According to Epic, it appears that all attempts for arterial evaluation and/or angiography were not follow through with by patient. He does have a history of being seen in lymphedema clinic in 2018, stopped going approximately 6 months ago stating "it didn't do any good". He does not have lymphedema pumps, he does not have custom fit compression wrap/stockings. He is diabetic and his recent A1c last month was 7.6. He admits to chronic bilateral lower extremity pain, no change in pain since blister and ulceration development. He is currently  being treated with Levaquin for bronchitis. He has home health and we will continue. 10/25/17-He is here in follow-up evaluation for bilateral lower extremity ulcerationssubtle he remains on Levaquin for bronchitis. Right lower extremity with no evidence of drainage or ulceration, persistent left lower extremity ulceration. He states that home health has not been out since his appointment. He went to [facility]  on Tuesday, studies revealed: RIGHT ABI 0.9, TBI 0.6 LEFT ABI 1.1, TBI 0.6 with triphasic flow bilaterally. We will continue his same treatment plan. He has been educated on compression therapy and need for elevation. He will benefit from lymphedema pumps 11/01/17-He is here in follow-up evaluation for left lower extremity ulcer. The right lower extremity remains healed. He has home health services, but they have not been out to see the patient for 2-3 weeks. He states it home health physical therapy changed his dressing yesterday after therapy; he placed Ace wrap compression. We are still waiting for lymphedema pumps, reordered d/t need for company change. 11/08/17-He is here in follow-up evaluation for left lower extremity ulcer. It is improved. Edema is significantly improved with compression therapy. We will continue with same treatment plan and he will follow-up next week. No word regarding lymphedema pumps 11/15/17-He is here in follow-up evaluation for left lower extremity ulcer. He is healed and will be discharged from wound care services. I have reached out to [facility] regarding his lymphedema pumps. They have been unable to reach the patient; the contact number they had with the patient's wife's cell phone and she has not answered any unrecognized calls. Contact should be made today, trial planned for next week; [facility] will continue to follow 11/27/17 on evaluation today patient has multiple blistered areas over the right lower extremity his left lower  extremity appears to be doing okay. These blistered areas show signs of no infection which is great news. With that being said he did have some necrotic skin overlying which was mechanically debrided away with saline and gauze today without complication. Overall post debridement the wounds appear to be doing better but in general his swelling seems to be increased. This is obviously not good news. I think this is what has given rise to the blisters. 12/04/17 on evaluation today patient presents for follow-up concerning his bilateral lower extremity edema in the right lower extremity ulcers. He has been tolerating the dressing changes without complication. With that being said he has had no real issues with the wraps which is also good news. Overall I'm pleased with the progress he's been making. 12/11/17 on evaluation today patient appears to be doing rather well in regard to his right lateral lower extremity ulcer. He's been tolerating the dressing changes without complication. Fortunately there does not appear to be any evidence of infection at this time. Overall I'm pleased with the progress that is being made. Unfortunately he has been in the hospital due to having what sounds to be a stomach virus/flu fortunately that is starting to get better. 12/18/17 on evaluation today patient actually appears to be doing very well in regard to his bilateral lower extremities the swelling is under fairly good control his lymphedema pumps are still not up and running quite yet. With that being said he does have several areas of opening noted as far as wounds are concerned mainly over the left lower extremity. With that being said I do believe once he gets lymphedema pumps this would least help you mention some the fluid and preventing this from occurring. Hopefully that will be set up soon sleeves are Artie in place at his home he just waiting for the machine. 12/25/17 on evaluation today patient actually appears  to be doing excellent in fact all of his ulcers appear to have resolved his RHYLEE, NUNN. ([id number]) legs appear very well. I do think he needs compression stockings we have discussed this  and they are actually going to go to [facility] today to elastic therapy to get this fitted for him. I think that is definitely a good thing to do. Readmission: 04/09/18 upon evaluation today this patient is seen for readmission due to bilateral lower extremity lymphedema. He has significant swelling of his extremities especially on the left although the right is also swollen he has weeping from both sides. There are no obvious open wounds at this point. Fortunately he has been doing fairly well for quite a bit of time since I last saw him. Nonetheless unfortunately this seems to have reopened and is giving quite a bit of trouble. He states this began about a week ago when he first called [place] to get in to be seen. No fevers, chills, nausea, or vomiting noted at this time. He has not been using his lymphedema pumps due to the fact that they won't fit on his leg at this point likewise is also not been using his compression for essentially the same reason. 04/16/18 upon evaluation today patient actually appears to be doing a little better in regard to the fluid in his bilateral lower extremities. With that being said he's had three falls since I saw him last week. He also states that he's been feeling very poorly. I was concerned last week and feel like that the concern is still there as far as the congestion in his chest is concerned he seems to be breathing about the same as last week but again he states he's very weak he's not even able to walk further than from the chair to the door. His wife had to buy a wheelchair just to be able to get them out of the house to get to the appointment today. This has me very concerned. 04/23/18 on evaluation today patient actually appears to be doing much better than last week's  evaluation. At that time actually had to transport him to the ER via EMS and he subsequently was admitted for acute pulmonary edema, acute renal failure, and acute congestive heart failure. Fortunately he is doing much better. Apparently they did dialyze him and were able to take off roughly 35 pounds of fluid. Nonetheless he is feeling much better both in regard to his breathing and he's able to get around much better at this time compared to previous. Overall I'm very happy with how things are at this time. There does not appear to be any evidence of infection currently. No fevers, chills, nausea, or vomiting noted at this time. 04/30/2018 patient seen today for follow-up and management of bilateral lower extremity lymphedema. He did express being more sad today than usual due to the recent loss of his dog. He states that he has been compliant with using the lymphedema pumps. However he does admit a minute over the last 2-3 days he has not been using the pumps due to the recent loss of his dog. At this time there is no drainage or open wounds to his lower extremities. The left leg edema is measuring smaller today. Still has a significant amount of edema on bilateral lower extremities With dry flaky skin. He will be referred to the lymphedema clinic for further management. Will continue 3 layer compression wraps and follow-up in 1-2 weeks.Denies any pain, fever, chairs recently. No recent falls or injuries reported during this visit. 05/07/18 on evaluation today patient actually appears to be doing very well in regard to his lower extremities in general all things considered. With that being  said he is having some pain in the legs just due to the amount of swelling. He does have an area where he had a blister on the left lateral lower extremity this is open at this point other than that there's nothing else weeping at this time. 05/14/18 on evaluation today patient actually appears to be doing  excellent all things considered in regard to his lower extremities. He still has a couple areas of weeping on each leg which has continued to be the issue for him. He does have an appointment with the lymphedema clinic although this isn't until February 2020. That was the earliest they had. In the meantime he has continued to tolerate the compression wraps without complication. 05/28/18 on evaluation today patient actually appears to be doing more poorly in regard to his left lower extremity where he has a wound open at this point. He also had a fall where he subsequently injured his right great toe which has led to an open wound at the site unfortunately. He has been tolerating the dressing changes without complication in general as far as the wraps are concerned that he has not been putting any dressing on the left 1st toe ulcer site. 06/11/18 on evaluation today patient appears to be doing much worse in regard to his bilateral lower extremity ulcers. He has been tolerating the dressing changes without complication although his legs have not been wrapped more recently. Overall I am not very pleased with the way his legs appear. I do believe he needs to be back in compression wraps he still has not received his compression wraps from the [facility] hospital as of yet. 06/18/18 on evaluation today patient actually appears to be doing significantly better than last time I saw him. He has been tolerating the compression wraps without complication in the circumferential ulcers especially appear to be doing much better. His toe ulcer on the right in regard to the great toe is better although not as good as the legs in my pinion. No fevers chills noted HAYATO, GUAMAN ([id number]) 07/02/18 on evaluation today patient appears to be doing much better in regard to his lower extremity ulcers. Unfortunately since I last saw him he's had the distal portion of his right great toe if he dated it sounds as if this actually  went downhill very quickly. I had only seen him a few days prior and the toe did not appear to be infected at that point subsequently became infected very rapidly and it was decided by the surgeon that the distal portion of the toe needed to be removed. The patient seems to be doing well in this regard he tells me. With that being said his lower extremities are doing better from the standpoint of the wounds although he is significantly swollen at this point. 07/09/18 on evaluation today patient appears to be doing better in regard to the wounds on his lower extremities. In fact everything is almost completely healed he is just a small area on the left posterior lower extremity that is open at this point. 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 With that being said he needs some kind of compression it does sound like he may not be wearing his compression, that is the wraps, during the entire time between when he's here visit to visit. Apparently  his wife took the current one off because it began to "fall apart". 07/16/18 on evaluation today patient appears to be extremely swollen especially in regard to his left lower extremity unfortunately. He also has a new skin tear over the left lower extremity and there's a smaller area on the right lower extremity as well. Unfortunately this seems to be due in part to blistering and fluid buildup in his leg. He did get the reduction wraps that were ordered by the [facility] hospital for him to go to lymphedema clinic. With that being said his wounds on the legs have not healed to the point to where they would likely accept them as a patient lymphedema clinic currently. We need to try to get this to heal. With that being said he's been taking his wraps off which is not doing him any  favors at this point. In fact this is probably quite counterproductive compared to what needs to occur. We will likely need to increase to a four layer compression wrap and continue to also utilize elevation and he has to keep the wraps on not take them off as he's been doing currently hasn't had a wrap on since Saturday. 07/23/18 on evaluation today patient appears to be doing much better in regard to his bilateral lower extremities. In fact his left lower extremity which was the largest is actually 15 cm smaller today compared to what it was last time he was here in our clinic. This is obviously good news after just one week. Nonetheless the differences he actually kept the wraps on during the entire week this time. That's not typical for him. I do believe he understands a little bit better now the severity of the situation and why it's important for him to keep these wraps on. 07/30/18 on evaluation today patient actually appears to be doing rather well in regard to his lower extremities. His legs are much smaller than they have been in the past and he actually has only one very small rudder superficial region remaining that is not closed on the left lateral/posterior lower extremity even this is almost completely close. I do believe likely next week he will be healed without any complications. I do think we need to continue the wraps however this seems to be beneficial for him. I also think it may be a good time for [place] to go ahead and see about getting the appointment with the lymphedema clinic which is supposed to be made for him in order to keep this moving along and hopefully get them into compression wraps that will in the end help him to remain healed. 08/06/18 on evaluation today patient actually appears to be doing very well in regard as bilateral lower extremities. In fact his wounds appear to be completely healed at this time. He does have bilateral lymphedema which has been extremely  well controlled with the compression wraps. He is in the process of getting appointment with the lymphedema clinic we have made this referral were just waiting to hear back on the schedule time. We need to follow up on that today as well. 08/13/18 on evaluation today patient actually appears to be doing very well in regard to his bilateral lower extremities there are no open wounds at this point. We are gonna go ahead and see about ordering the Velcro compression wraps for him had a discussion with them about [place] doing it versus the [place] they feel like they can definitely afford going ahead and get the  wraps themselves and they would prefer to try to avoid having to go to the lymphedema clinic if it all possible which I completely understand. As long as he has good compression I'm okay either way. 3/17//20 on evaluation today patient actually appears to be doing well in regard to his bilateral lower extremity ulcers. He has been tolerating the dressing changes without complication specifically the compression wraps. Overall is had no issues my fingers finding that I see at this point is that he is having trouble with constipation. He tells me he has not been able to go to the bathroom for about six days. He's taken to over-the-counter oral laxatives unfortunately this is not helping. He has not contacted his doctor. 08/27/18 on evaluation today patient appears to be doing fairly well in regard to his lower extremities at this point. There does not appear to be any new altars is swelling is very well controlled. We are still waiting for his Velcro compression wraps to arrive that should be sometime in the next week is Artie sent the check for these. 09/03/18 on evaluation today patient appears to be doing excellent in regard to his bilateral lower extremities which he shows DARRIAN, GRZELAK. ([id number]) no signs of wound openings in regard to this point. He does have his Velcro compression wraps which did  arrive in the mail since I saw him last week. Overall he is doing excellent in my pinion. 09/13/18 on evaluation today patient appears to be doing very well currently in regard to the overall appearance of his bilateral lower extremities although he's a little bit more swollen than last time we saw him. At that point he been discharged without any open wounds. Nonetheless he has a small open wound on the posterior left lower extremity with some evidence of cellulitis noted as well. Fortunately I feel like he has made good progress overall with regard to his lower extremities from were things used to be. 09/20/18 on evaluation today patient actually appears to be doing much better. The erythematous lower extremity is improving wound itself which is still open appears to be doing much better as far as but appearance as well as pain is concerned overall very pleased in this regard. There's no signs of active infection at this time. 09/27/18 on evaluation today patient's wounds on the lower extremity actually appear to be doing fairly well at this time which is good news. There is no evidence of active infection currently and again is just as left lower extremity were there any wounds at this point anyway. I believe they may be completely healed but again I'm not 100% sure based on evaluation today. I think one more week of observation would likely be a good idea. 10/04/18 on evaluation today patient actually appears to be doing excellent in regard to the left lower extremity which actually appears to be completely healed as of today. Unfortunately he's been having issues with his right lower extremity have a new wound that has opened. Fortunately there's no evidence of active infection at this time which is good news. 10/10/18 upon evaluation today patient actually appears to be doing a little bit worse with the new open area on his right posterior lower extremity. He's been tolerating the dressing changes  without complication. Right now we been using his compression wraps although I think we may need to switch back to actually performing bilateral compression wraps are in the clinic. No fevers, chills, nausea, or vomiting noted at this time.  10/17/18 on evaluation today patient actually appears to be doing quite well in regard to his bilateral lower extremity ulcers. He has been tolerating the reps without complication although he would prefer not to rewrap his legs as of today. Fortunately there's no signs of active infection which is good news. No fevers, chills, nausea, or vomiting noted at this time. 10/24/18 on evaluation today patient appears to be doing very well in regard to his lower extremities. His right lower extremity is shown signs of healing and his left lower [place] though not healed appears to be improving which is excellent news. Overall very pleased with how things seem to be progressing at this time. The patient is likewise happy to hear this. His Velcro compression wraps however have not been put on properly will gonna show his wife how to do that properly today. 10/31/18 on evaluation today patient appears to be doing more poorly in regard to his left lower extremity in particular although both lower extremities actually are showing some signs of being worse than my previous evaluation. Unfortunately I'm just not sure that his compression stockings even with the use of the compression/lymphedema pumps seem to be controlling this well. Upon further questioning he tells me that he also is not able to lie flat in the bed due to his congestive heart failure and difficulty breathing. For that reason he sleeps in his recliner. To make matters worse his recliner also cannot even hold his legs up so instead of even being somewhat elevated they pretty much hang down to the floor. This is the way he sleeps each night which is definitely counterproductive to everything else that were  attempting to do from the standpoint of controlling his fluid. Nonetheless I think that he potentially could benefit from a hospital bed although this would be something that his primary care provider would likely have to order since anything that is order on our side has to be directly related to wound care and again the hospital bed is not necessarily a direct relation to although I think it does contribute to his overall wound status on his lower extremities. 11/07/18 on evaluation today patient appears to be doing better in regard to his bilateral lower extremity. He's been tolerating the dressing changes without complication. We did in the interim since I last saw him switch to just using extras orbiting new alginate and that seems to have done much better for him. I'm very pleased with the overall progress that is made. 11/14/18 on evaluation today patient appears to be redoing rather well in regard to his left lower extremity ulcers which are the only ones remaining at this point. Fortunately there's no signs of active infection at this time which is good news. Overall been very pleased with how things seem to be progressing currently. No fevers, chills, nausea, or vomiting noted at this time. 11/20/17 on evaluation today patient actually appears to be doing quite well in regard to his lower extremities on the left on the right he has several blisters that showed up although there's some question about whether or not he has had his broker compression wraps on like he was supposed to or not. Fortunately there's no signs of active infection at this time which is good news. Unfortunately though he is doing well from the standpoint of the left leg the right leg is not doing as well again this TRICIA, OAXACA. ([id number]) is when we did not wrap last week. 11/28/18 on evaluation today  patient appears to be doing well in regard to his bilateral lower extremities is left appears to be healed is right is  not healed but is very close to being so. Overall very pleased with how things seem to be progressing. Patient is likewise happy that things are doing well. 12/09/18 on evaluation today patient actually appears to be completely healed which is excellent news. He actually seems to be doing well in regard to the swelling of the bilateral lower extremities which is also great news. Overall very pleased with how things seem to be progressing. Readmission: 03/18/2019 upon evaluation today patient presents for reevaluation concerning issues that he is having with his left lower extremity where he does have a wound noted upon inspection today. He also has a wound on the right second toe on the tip where it is apparently been rubbing in his shoe I did look at issues and you can actually see where this has been occurring as well. Fortunately there is no signs of infection with regard to the toe necessarily although it is very swollen compared to normal that does have me somewhat concerned about the possibility of further evaluation for infection/osteomyelitis. I would recommend an x-ray to start with. Otherwise he states that it is been 1-2 weeks that he has had the draining in regard to left lower extremity he does not know how this happened he has been wearing his compression appropriately and I do see that as well today but nonetheless I do think that he needs to continue to be very cautious with regard to elevation as well. 03/25/2019 on evaluation today patient appears to be doing much better in regard to his lower extremity at this time. That is on the left. He did culture positive for Pseudomonas but the good news is he seems to be doing much better the gentamicin we applied topically seems to have done a great job for him. There is no signs of active infection at this time systemically and locally he is doing much better. No fevers, chills, nausea, vomiting, or diarrhea. In regard to his right toe this  seems to be doing much better there is very little pressure noted at this point I did clean up some of the callus today around the edges of the wound as well as the surface of the wound but to be honest he is progressing quite nicely. 10/30; the area on the left anterior lower tibia area is healed over. His edema control is adequate even though his use of the compression pumps seems very intermittent. He has a juxta lite stocking on the right leg and a think there is 1 for the left leg at home. His wife is putting these on. He has an area on the plantar right second toe which is a hammertoe they are trying to offload this 04/08/2019 on evaluation today patient appears to be doing well in regard to his lower extremities which is good news. We are seeing him today for his toe ulcer which is giving him a little bit more trouble but again still seems to be doing better at this point which is good news. He is going require some sharp debridement in regard to the toe today however. Electronic Signature(s) Signed: 04/10/2019 10:14:16 AM By: Worthy Keeler PA-C Entered By: Worthy Keeler on 04/10/2019 10:14:16 [name] ([id number]) -------------------------------------------------------------------------------- Physical Exam Details Patient Name: [name] Date of Service: 04/08/2019 3:45 PM Medical Record Number: [id number] Patient Account Number: [id number]  Date of Birth/Sex: 1945-10-16 (73 y.o. M) Treating RN: Montey Hora Primary Care Provider: [name] Other Clinician: Referring Provider: [name] Treating Provider/Extender: STONE III, [name]: 3 Constitutional Well-nourished and well-hydrated in no acute distress. Respiratory normal breathing without difficulty. Psychiatric this patient is able to make decisions and demonstrates good insight into disease process. Alert and Oriented x 3. pleasant and cooperative. Notes Patient's wound bed currently  showed signs of some slough on the surface of the wound in regard to his toe this did require sharp debridement today and I was able to debride this away with the use of a curette. He tolerated that without any pain subsequently he does have neuropathy. Electronic Signature(s) Signed: 04/10/2019 10:14:55 AM By: Worthy Keeler PA-C Entered By: Worthy Keeler on 04/10/2019 10:14:54 [name] ([id number]) -------------------------------------------------------------------------------- Physician Orders Details Patient Name: [name] Date of Service: 04/08/2019 3:45 PM Medical Record Number: [id number] Patient Account Number: [id number] Date of Birth/Sex: Apr 18, 1946 (73 y.o. 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 Primary Wound Dressing Wound #24 Right,Distal Toe Second o Silver Collagen Secondary Dressing Wound #24 Right,Distal Toe Second o Gauze and Kerlix/Conform Dressing Change Frequency Wound #24 Right,Distal Toe Second o Change dressing every other day. Follow-up Appointments Wound #24 Right,Distal Toe Second o Return Appointment in 1 week. Edema Control Wound #24 Right,Distal Toe Second o Patient to wear own Velcro compression  garment. o Elevate legs to the level of the heart and pump ankles as often as possible o Compression Pump: Use compression pump on left lower extremity for 60 minutes, twice daily. Electronic Signature(s) Signed: 04/08/2019 4:57:37 PM By: Montey Hora Signed: 04/10/2019 6:06:55 PM By: Worthy Keeler PA-C Entered By: Montey Hora on 04/08/2019 16:29:08 [name] ([id number]) CHAMPION, CORALES ([id number]) -------------------------------------------------------------------------------- Problem List Details Patient Name: REYNOL, ARNONE Date of Service: 04/08/2019 3:45 PM Medical Record Number: [id number] Patient Account Number: [id number] Date of Birth/Sex: September 18, 1945 (73 y.o. M) Treating RN: Montey Hora Primary Care Provider: [name] Other Clinician: Referring Provider: [name] Treating Provider/Extender: Melburn Hake, [name]: 3 Active Problems ICD-10 Evaluated Encounter Code Description Active Date Today Diagnosis I89.0 Lymphedema, not elsewhere classified 03/18/2019 No Yes E11.621 Type 2 diabetes mellitus with foot ulcer 03/18/2019 No Yes L97.512 Non-pressure chronic ulcer of other part of right foot with fat 03/18/2019 No Yes layer exposed L97.822 Non-pressure chronic ulcer of other part of left lower leg with 03/18/2019 No Yes fat layer exposed E66.09 Other obesity due to excess calories 03/18/2019 No Yes Inactive Problems Resolved Problems Electronic Signature(s) Signed: 04/08/2019 4:21:02 PM By: Worthy Keeler PA-C Entered By: Worthy Keeler on 04/08/2019 16:21:02 [name] ([id number]) -------------------------------------------------------------------------------- Progress Note Details Patient Name: [name] Date of Service: 04/08/2019 3:45 PM Medical Record Number: [id number] Patient Account Number: [id number] Date of  Birth/Sex: 04/03/1946 (73 y.o. M) Treating RN: Montey Hora Primary Care Provider: [name] Other  Clinician: Referring Provider: [name] Treating Provider/Extender: Melburn Hake, [name]: 3 Subjective Chief Complaint Information obtained from Patient Right toe and left LE Ulcer History of Present Illness (HPI) 10/18/17-He is here for initial evaluation of bilateral lower extremity ulcerations in the presence of venous insufficiency and lymphedema. He has been seen by vascular medicine in the past, Dr. Lucky Cowboy, last seen in 2016. He does have a history of abnormal ABIs, which is to be expected given his lymphedema and venous insufficiency. According to Epic, it appears that all attempts for arterial evaluation and/or angiography were not follow through with by patient. He does have a history of being seen in lymphedema clinic in 2018, stopped going approximately 6 months ago stating "it didn't do any good". He does not have lymphedema pumps, he does not have custom fit compression wrap/stockings. He is diabetic and his recent A1c last month was 7.6. He admits to chronic bilateral lower extremity pain, no change in pain since blister and ulceration development. He is currently being treated with Levaquin for bronchitis. He has home health and we will continue. 10/25/17-He is here in follow-up evaluation for bilateral lower extremity ulcerationssubtle he remains on Levaquin for bronchitis. Right lower extremity with no evidence of drainage or ulceration, persistent left lower extremity ulceration. He states that home health has not been out since his appointment. He went to [facility] on Tuesday, studies revealed: RIGHT ABI 0.9, TBI 0.6 LEFT ABI 1.1, TBI 0.6 with triphasic flow bilaterally. We will continue his same treatment plan. He has been educated on compression therapy and need for elevation. He will benefit from lymphedema pumps 11/01/17-He is here in follow-up evaluation for left lower extremity ulcer. The right lower extremity remains healed. He has home  health services, but they have not been out to see the patient for 2-3 weeks. He states it home health physical therapy changed his dressing yesterday after therapy; he placed Ace wrap compression. We are still waiting for lymphedema pumps, reordered d/t need for company change. 11/08/17-He is here in follow-up evaluation for left lower extremity ulcer. It is improved. Edema is significantly improved with compression therapy. We will continue with same treatment plan and he will follow-up next week. No word regarding lymphedema pumps 11/15/17-He is here in follow-up evaluation for left lower extremity ulcer. He is healed and will be discharged from wound care services. I have reached out to [facility] regarding his lymphedema pumps. They have been unable to reach the patient; the contact number they had with the patient's wife's cell phone and she has not answered any unrecognized calls. Contact should be made today, trial planned for next week; [facility] will continue to follow 11/27/17 on evaluation today patient has multiple blistered areas over the right lower extremity his left lower extremity appears to be doing okay. These blistered areas show signs of no infection which is great news. With that being said he did have some necrotic skin overlying which was mechanically debrided away with saline and gauze today without complication. Overall post debridement the wounds appear to be doing better but in general his swelling seems to be increased. This is obviously not good news. I think this is what has given rise to the blisters. 12/04/17 on evaluation today patient presents for follow-up concerning his bilateral lower extremity edema in the right lower extremity ulcers. He has been tolerating the  dressing changes without complication. With that being said he has had no real issues with the wraps which is also good news. Overall I'm pleased with the progress he's been making. 12/11/17 on  evaluation today patient appears to be doing rather well in regard to his right lateral lower extremity ulcer. He's been tolerating the dressing changes without complication. Fortunately there does not appear to be any evidence of infection at this time. Overall I'm pleased with the progress that is being made. Unfortunately he has been in the hospital due to having what sounds to be a stomach virus/flu fortunately that is starting to get better. 12/18/17 on evaluation today patient actually appears to be doing very well in regard to his bilateral lower extremities the NEVEN, FINA. ([id number]) swelling is under fairly good control his lymphedema pumps are still not up and running quite yet. With that being said he does have several areas of opening noted as far as wounds are concerned mainly over the left lower extremity. With that being said I do believe once he gets lymphedema pumps this would least help you mention some the fluid and preventing this from occurring. Hopefully that will be set up soon sleeves are Artie in place at his home he just waiting for the machine. 12/25/17 on evaluation today patient actually appears to be doing excellent in fact all of his ulcers appear to have resolved his legs appear very well. I do think he needs compression stockings we have discussed this and they are actually going to go to [place] today to elastic therapy to get this fitted for him. I think that is definitely a good thing to do. Readmission: 04/09/18 upon evaluation today this patient is seen for readmission due to bilateral lower extremity lymphedema. He has significant swelling of his extremities especially on the left although the right is also swollen he has weeping from both sides. There are no obvious open wounds at this point. Fortunately he has been doing fairly well for quite a bit of time since I last saw him. Nonetheless unfortunately this seems to have reopened and is giving quite a bit  of trouble. He states this began about a week ago when he first called [place] to get in to be seen. No fevers, chills, nausea, or vomiting noted at this time. He has not been using his lymphedema pumps due to the fact that they won't fit on his leg at this point likewise is also not been using his compression for essentially the same reason. 04/16/18 upon evaluation today patient actually appears to be doing a little better in regard to the fluid in his bilateral lower extremities. With that being said he's had three falls since I saw him last week. He also states that he's been feeling very poorly. I was concerned last week and feel like that the concern is still there as far as the congestion in his chest is concerned he seems to be breathing about the same as last week but again he states he's very weak he's not even able to walk further than from the chair to the door. His wife had to buy a wheelchair just to be able to get them out of the house to get to the appointment today. This has me very concerned. 04/23/18 on evaluation today patient actually appears to be doing much better than last week's evaluation. At that time actually had to transport him to the ER via EMS and he subsequently was admitted for acute  pulmonary edema, acute renal failure, and acute congestive heart failure. Fortunately he is doing much better. Apparently they did dialyze him and were able to take off roughly 35 pounds of fluid. Nonetheless he is feeling much better both in regard to his breathing and he's able to get around much better at this time compared to previous. Overall I'm very happy with how things are at this time. There does not appear to be any evidence of infection currently. No fevers, chills, nausea, or vomiting noted at this time. 04/30/2018 patient seen today for follow-up and management of bilateral lower extremity lymphedema. He did express being more sad today than usual due to the recent loss of his  dog. He states that he has been compliant with using the lymphedema pumps. However he does admit a minute over the last 2-3 days he has not been using the pumps due to the recent loss of his dog. At this time there is no drainage or open wounds to his lower extremities. The left leg edema is measuring smaller today. Still has a significant amount of edema on bilateral lower extremities With dry flaky skin. He will be referred to the lymphedema clinic for further management. Will continue 3 layer compression wraps and follow-up in 1-2 weeks.Denies any pain, fever, chairs recently. No recent falls or injuries reported during this visit. 05/07/18 on evaluation today patient actually appears to be doing very well in regard to his lower extremities in general all things considered. With that being said he is having some pain in the legs just due to the amount of swelling. He does have an area where he had a blister on the left lateral lower extremity this is open at this point other than that there's nothing else weeping at this time. 05/14/18 on evaluation today patient actually appears to be doing excellent all things considered in regard to his lower extremities. He still has a couple areas of weeping on each leg which has continued to be the issue for him. He does have an appointment with the lymphedema clinic although this isn't until February 2020. That was the earliest they had. In the meantime he has continued to tolerate the compression wraps without complication. 05/28/18 on evaluation today patient actually appears to be doing more poorly in regard to his left lower extremity where he has a wound open at this point. He also had a fall where he subsequently injured his right great toe which has led to an open wound at the site unfortunately. He has been tolerating the dressing changes without complication in general as far as the wraps are concerned that he has not been putting any dressing on  the left 1st toe ulcer site. 06/11/18 on evaluation today patient appears to be doing much worse in regard to his bilateral lower extremity ulcers. He has been tolerating the dressing changes without complication although his legs have not been wrapped more recently. Overall I am not very pleased with the way his legs appear. I do believe he needs to be back in compression wraps he still has not received his compression wraps from the [facility] hospital as of yet. GURKARAN, RAHM ([id number]) 06/18/18 on evaluation today patient actually appears to be doing significantly better than last time I saw him. He has been tolerating the compression wraps without complication in the circumferential ulcers especially appear to be doing much better. His toe ulcer on the right in regard to the great toe is better although not as  good as the legs in my pinion. No fevers chills noted 07/02/18 on evaluation today patient appears to be doing much better in regard to his lower extremity ulcers. Unfortunately since I last saw him he's had the distal portion of his right great toe if he dated it sounds as if this actually went downhill very quickly. I had only seen him a few days prior and the toe did not appear to be infected at that point subsequently became infected very rapidly and it was decided by the surgeon that the distal portion of the toe needed to be removed. The patient seems to be doing well in this regard he tells me. With that being said his lower extremities are doing better from the standpoint of the wounds although he is significantly swollen at this point. 07/09/18 on evaluation today patient appears to be doing better in regard to the wounds on his lower extremities. In fact everything is almost completely healed he is just a small area on the left posterior lower extremity that is open at this point. 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 With that being said he needs some kind of compression it does sound like he may not be wearing his compression, that is the wraps, during the entire time between when he's here visit to visit. Apparently his wife took the current one off because it began to "fall apart". 07/16/18 on evaluation today patient appears to be extremely swollen especially in regard to his left lower extremity unfortunately. He also has a new skin tear over the left lower extremity and there's a smaller area on the right lower extremity as well. Unfortunately this seems to be due in part to blistering and fluid buildup in his leg. He did get the reduction wraps that were ordered by the [facility] hospital for him to go to lymphedema clinic. With that being said his wounds on the legs have not healed to the point to where they would likely accept them as a patient lymphedema clinic currently. We need to try to get this to heal. With that being said he's been taking his wraps off which is not doing him any favors at this point. In fact this is probably quite counterproductive compared to what needs to occur. We will likely need to increase to a four layer compression wrap and continue to also utilize elevation and he has to keep the wraps on not take them off as he's been doing currently hasn't had a wrap on since Saturday. 07/23/18 on evaluation today patient appears to be doing much better in regard to his bilateral lower extremities. In fact his left lower extremity which was the largest is actually 15 cm smaller today compared to what it was last time he was here in our clinic. This is obviously good news after just one week. Nonetheless the differences he actually kept the wraps on during the entire week this time. That's not typical for him. I do believe he understands a little bit better now the severity  of the situation and why it's important for him to keep these wraps on. 07/30/18 on evaluation today patient actually appears to be doing rather well in regard to his lower extremities. His legs are much smaller than they have been in the past and he actually has only  one very small rudder superficial region remaining that is not closed on the left lateral/posterior lower extremity even this is almost completely close. I do believe likely next week he will be healed without any complications. I do think we need to continue the wraps however this seems to be beneficial for him. I also think it may be a good time for [place] to go ahead and see about getting the appointment with the lymphedema clinic which is supposed to be made for him in order to keep this moving along and hopefully get them into compression wraps that will in the end help him to remain healed. 08/06/18 on evaluation today patient actually appears to be doing very well in regard as bilateral lower extremities. In fact his wounds appear to be completely healed at this time. 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 He is in the process of getting appointment with the lymphedema clinic we have made this referral were just waiting to hear back on the schedule time. We need to follow up on that today as well. 08/13/18 on evaluation today patient actually appears to be doing very well in regard to his bilateral lower extremities there are no open wounds at this point. We are gonna go ahead and see about ordering the Velcro compression wraps for him had a discussion with them about [place] doing it versus the [place] they feel like they can definitely afford going ahead and get the wraps themselves and they would prefer to try to avoid having to go to the lymphedema clinic if it all possible which I completely understand. As long as he has good compression I'm okay either way. 3/17//20 on  evaluation today patient actually appears to be doing well in regard to his bilateral lower extremity ulcers. He has been tolerating the dressing changes without complication specifically the compression wraps. Overall is had no issues my fingers finding that I see at this point is that he is having trouble with constipation. He tells me he has not been able to go to the bathroom for about six days. He's taken to over-the-counter oral laxatives unfortunately this is not helping. He has not contacted his doctor. KOLBY, SCHARA ([id number]) 08/27/18 on evaluation today patient appears to be doing fairly well in regard to his lower extremities at this point. There does not appear to be any new altars is swelling is very well controlled. We are still waiting for his Velcro compression wraps to arrive that should be sometime in the next week is Artie sent the check for these. 09/03/18 on evaluation today patient appears to be doing excellent in regard to his bilateral lower extremities which he shows no signs of wound openings in regard to this point. He does have his Velcro compression wraps which did arrive in the mail since I saw him last week. Overall he is doing excellent in my pinion. 09/13/18 on evaluation today patient appears to be doing very well currently in regard to the overall appearance of his bilateral lower extremities although he's a little bit more swollen than last time we saw him. At that point he been discharged without any open wounds. Nonetheless he has a small open wound on the posterior left lower extremity with some evidence of cellulitis noted as well. Fortunately I feel like he has made good progress overall with regard to his lower extremities from were things used to be. 09/20/18 on evaluation today patient actually appears  to be doing much better. The erythematous lower extremity is improving wound itself which is still open appears to be doing much better as far as but  appearance as well as pain is concerned overall very pleased in this regard. There's no signs of active infection at this time. 09/27/18 on evaluation today patient's wounds on the lower extremity actually appear to be doing fairly well at this time which is good news. There is no evidence of active infection currently and again is just as left lower extremity were there any wounds at this point anyway. I believe they may be completely healed but again I'm not 100% sure based on evaluation today. I think one more week of observation would likely be a good idea. 10/04/18 on evaluation today patient actually appears to be doing excellent in regard to the left lower extremity which actually appears to be completely healed as of today. Unfortunately he's been having issues with his right lower extremity have a new wound that has opened. Fortunately there's no evidence of active infection at this time which is good news. 10/10/18 upon evaluation today patient actually appears to be doing a little bit worse with the new open area on his right posterior lower extremity. He's been tolerating the dressing changes without complication. Right now we been using his compression wraps although I think we may need to switch back to actually performing bilateral compression wraps are in the clinic. No fevers, chills, nausea, or vomiting noted at this time. 10/17/18 on evaluation today patient actually appears to be doing quite well in regard to his bilateral lower extremity ulcers. He has been tolerating the reps without complication although he would prefer not to rewrap his legs as of today. Fortunately there's no signs of active infection which is good news. No fevers, chills, nausea, or vomiting noted at this time. 10/24/18 on evaluation today patient appears to be doing very well in regard to his lower extremities. His right lower extremity is shown signs of healing and his left lower Trinity though not healed  appears to be improving which is excellent news. Overall very pleased with how things seem to be progressing at this time. The patient is likewise happy to hear this. His Velcro compression wraps however have not been put on properly will gonna show his wife how to do that properly today. 10/31/18 on evaluation today patient appears to be doing more poorly in regard to his left lower extremity in particular although both lower extremities actually are showing some signs of being worse than my previous evaluation. Unfortunately I'm just not sure that his compression stockings even with the use of the compression/lymphedema pumps seem to be controlling this well. Upon further questioning he tells me that he also is not able to lie flat in the bed due to his congestive heart failure and difficulty breathing. For that reason he sleeps in his recliner. To make matters worse his recliner also cannot even hold his legs up so instead of even being somewhat elevated they pretty much hang down to the floor. This is the way he sleeps each night which is definitely counterproductive to everything else that were attempting to do from the standpoint of controlling his fluid. Nonetheless I think that he potentially could benefit from a hospital bed although this would be something that his primary care provider would likely have to order since anything that is order on our side has to be directly related to wound care and again the hospital  bed is not necessarily a direct relation to although I think it does contribute to his overall wound status on his lower extremities. 11/07/18 on evaluation today patient appears to be doing better in regard to his bilateral lower extremity. He's been tolerating the dressing changes without complication. We did in the interim since I last saw him switch to just using extras orbiting new alginate and that seems to have done much better for him. I'm very pleased with the overall  progress that is made. 11/14/18 on evaluation today patient appears to be redoing rather well in regard to his left lower extremity ulcers which are the only ones remaining at this point. Fortunately there's no signs of active infection at this time which is good news. Overall been EXODUS, KUTZER ([id number]) very pleased with how things seem to be progressing currently. No fevers, chills, nausea, or vomiting noted at this time. 11/20/17 on evaluation today patient actually appears to be doing quite well in regard to his lower extremities on the left on the right he has several blisters that showed up although there's some question about whether or not he has had his broker compression wraps on like he was supposed to or not. Fortunately there's no signs of active infection at this time which is good news. Unfortunately though he is doing well from the standpoint of the left leg the right leg is not doing as well again this is when we did not wrap last week. 11/28/18 on evaluation today patient appears to be doing well in regard to his bilateral lower extremities is left appears to be healed is right is not healed but is very close to being so. Overall very pleased with how things seem to be progressing. Patient is likewise happy that things are doing well. 12/09/18 on evaluation today patient actually appears to be completely healed which is excellent news. He actually seems to be doing well in regard to the swelling of the bilateral lower extremities which is also great news. Overall very pleased with how things seem to be progressing. Readmission: 03/18/2019 upon evaluation today patient presents for reevaluation concerning issues that he is having with his left lower extremity where he does have a wound noted upon inspection today. He also has a wound on the right second toe on the tip where it is apparently been rubbing in his shoe I did look at issues and you can actually see where this has been  occurring as well. Fortunately there is no signs of infection with regard to the toe necessarily although it is very swollen compared to normal that does have me somewhat concerned about the possibility of further evaluation for infection/osteomyelitis. I would recommend an x-ray to start with. Otherwise he states that it is been 1-2 weeks that he has had the draining in regard to left lower extremity he does not know how this happened he has been wearing his compression appropriately and I do see that as well today but nonetheless I do think that he needs to continue to be very cautious with regard to elevation as well. 03/25/2019 on evaluation today patient appears to be doing much better in regard to his lower extremity at this time. That is on the left. He did culture positive for Pseudomonas but the good news is he seems to be doing much better the gentamicin we applied topically seems to have done a great job for him. There is no signs of active infection at this time systemically and  locally he is doing much better. No fevers, chills, nausea, vomiting, or diarrhea. In regard to his right toe this seems to be doing much better there is very little pressure noted at this point I did clean up some of the callus today around the edges of the wound as well as the surface of the wound but to be honest he is progressing quite nicely. 10/30; the area on the left anterior lower tibia area is healed over. His edema control is adequate even though his use of the compression pumps seems very intermittent. He has a juxta lite stocking on the right leg and a think there is 1 for the left leg at home. His wife is putting these on. He has an area on the plantar right second toe which is a hammertoe they are trying to offload this 04/08/2019 on evaluation today patient appears to be doing well in regard to his lower extremities which is good news. We are seeing him today for his toe ulcer which is giving him a  little bit more trouble but again still seems to be doing better at this point which is good news. He is going require some sharp debridement in regard to the toe today however. Patient History Information obtained from Patient. Family History Cancer - Paternal Grandparents, Kidney Disease - Siblings, Lung Disease - Mother, No family history of Diabetes, Heart Disease, Hereditary Spherocytosis, Hypertension, Seizures, Stroke, Thyroid Problems, Tuberculosis. Social History Never smoker, Marital Status - Married, Alcohol Use - Never, Drug Use - No History, Caffeine Use - Never. Medical History Eyes Denies history of Cataracts, Glaucoma, Optic Neuritis Ear/Nose/Mouth/Throat Denies history of Chronic sinus problems/congestion, Middle ear problems Hematologic/Lymphatic ISAURO, SKELLEY ([id number]) Patient has history of Anemia - aplastic anemia, Lymphedema Denies history of Hemophilia, Human Immunodeficiency Virus, Sickle Cell Disease Respiratory Patient has history of Chronic Obstructive Pulmonary Disease (COPD), Sleep Apnea Denies history of Aspiration, Asthma, Pneumothorax, Tuberculosis Cardiovascular Patient has history of Congestive Heart Failure, Coronary Artery Disease, Hypertension, Peripheral Venous Disease Denies history of Angina, Arrhythmia, Deep Vein Thrombosis, Hypotension, Myocardial Infarction, Peripheral Arterial Disease, Phlebitis, Vasculitis Gastrointestinal Denies history of Cirrhosis , Colitis, Crohn s, Hepatitis A, Hepatitis B, Hepatitis C Endocrine Patient has history of Type II Diabetes Denies history of Type I Diabetes Genitourinary Denies history of End Stage Renal Disease Immunological Denies history of Lupus Erythematosus, Raynaud s, Scleroderma Integumentary (Skin) Denies history of History of Burn, History of pressure wounds Musculoskeletal Patient has history of Osteoarthritis Denies history of Gout, Rheumatoid Arthritis,  Osteomyelitis Neurologic Patient has history of Neuropathy Denies history of Dementia, Quadriplegia, Paraplegia, Seizure Disorder Oncologic Patient has history of Received Chemotherapy - last dose 2006 Denies history of Received Radiation Psychiatric Denies history of Anorexia/bulimia, Confinement Anxiety Hospitalization/Surgery History - bronchitis. Medical And Surgical History Notes Respiratory home O2 at night as needed Cardiovascular varicose veins Neurologic CVA - 1992 Review of Systems (ROS) Constitutional Symptoms (General Health) Denies complaints or symptoms of Fatigue, Fever, Chills, Marked Weight Change. Respiratory Denies complaints or symptoms of Chronic or frequent coughs, Shortness of Breath. Cardiovascular Denies complaints or symptoms of Chest pain, LE edema. Psychiatric Denies complaints or symptoms of Anxiety, Claustrophobia. GAETANO, ROMBERGER ([id number]) Objective Constitutional Well-nourished and well-hydrated in no acute distress. Vitals Time Taken: 3:55 PM, Height: 66 in, Weight: 297 lbs, BMI: 47.9, Temperature: 98.6 F, Pulse: 74 bpm, Respiratory Rate: 18 breaths/min, Blood Pressure: 153/69 mmHg. Respiratory normal breathing without difficulty. Psychiatric this patient is able to make decisions and demonstrates good  insight into disease process. Alert and Oriented x 3. pleasant and cooperative. General Notes: Patient's wound bed currently showed signs of some slough on the surface of the wound in regard to his toe this did require sharp debridement today and I was able to debride this away with the use of a curette. He tolerated that without any pain subsequently he does have neuropathy. Integumentary (Hair, Skin) Wound #24 status is Open. Original cause of wound was Gradually Appeared. The wound is located on the Right,Distal Toe Second. The wound measures 0.6cm length x 0.4cm width x 0.1cm depth; 0.188cm^2 area and 0.019cm^3 volume. There is Fat  Layer (Subcutaneous Tissue) Exposed exposed. There is no tunneling or undermining noted. There is a medium amount of serous drainage noted. The wound margin is flat and intact. There is medium (34-66%) red granulation within the wound bed. There is a small (1-33%) amount of necrotic tissue within the wound bed including Adherent Slough. 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 There was a Excisional Skin/Subcutaneous Tissue Debridement with a total area of 0.24 sq cm performed by STONE III, HOYT E., PA-C. With the following instrument(s): Curette to remove Viable and Non-Viable tissue/material. Material removed includes Subcutaneous Tissue and Slough and after achieving pain control using Lidocaine 4% Topical Solution. No specimens were taken. A time out was conducted at 16:23, prior to the start of the procedure. A Minimum amount of bleeding was controlled with Pressure. The procedure was tolerated well with a pain level of 0 throughout and a pain level of 0 following the procedure. Post Debridement VEDANTH, SIRICO ([id number]) Measurements: 0.6cm length x 0.4cm width x 0.4cm depth; 0.075cm^3 volume. Character of Wound/Ulcer Post Debridement is improved. Severity of Tissue Post Debridement is: Fat layer exposed. Post procedure Diagnosis Wound #24: Same as Pre-Procedure Plan Wound Cleansing: Wound #24 Right,Distal Toe Second: Dial antibacterial soap, wash wounds, rinse and pat dry prior to dressing wounds May Shower, gently  pat wound dry prior to applying new dressing. Primary Wound Dressing: Wound #24 Right,Distal Toe Second: Silver Collagen Secondary Dressing: Wound #24 Right,Distal Toe Second: Gauze and Kerlix/Conform Dressing Change Frequency: Wound #24 Right,Distal Toe Second: Change dressing every other day. Follow-up Appointments: Wound #24 Right,Distal Toe Second: Return Appointment in 1 week. Edema Control: Wound #24 Right,Distal Toe Second: Patient to wear own Velcro compression garment. Elevate legs to the level of the heart and pump ankles as often as possible Compression Pump: Use compression pump on left lower extremity for 60 minutes, twice daily. 1. I would recommend currently that we go ahead and initiate treatment with silver collagen dressing which the patient is in agreement with. 2. I am also going to suggest that we go ahead and wrap this with gauze and con form. That seems to have done well up to this point as far as helping to pad the area. 3. Patient is wearing his Velcro compression stockings for his lower extremity edema bilaterally. He is also elevate his legs as much as possible. He also tells me he is using his pumps daily. This is good news.  We will see patient back for reevaluation in 1 week here in the clinic. If anything worsens or changes patient will contact our office for additional recommendations. Electronic Signature(s) Signed: 04/10/2019 10:15:45 AM By: Worthy Keeler PA-C Entered By: Worthy Keeler on 04/10/2019 10:15:45 [name] ([id number]) -------------------------------------------------------------------------------- ROS/PFSH Details Patient Name: [name] Date of Service: 04/08/2019 3:45 PM Medical Record Number: [id number] Patient Account Number: [id number] Date of Birth/Sex: 08/12/1945 (73 y.o. M) Treating RN: Montey Hora Primary Care Provider: [name] Other Clinician: Referring Provider: [name] Treating Provider/Extender:  Melburn Hake, [name]: 3 Information Obtained From Patient Constitutional Symptoms (General Health) Complaints and Symptoms: Negative for: Fatigue; Fever; Chills; Marked Weight Change Respiratory Complaints and Symptoms: Negative for: Chronic or frequent coughs; Shortness of Breath Medical History: Positive for: Chronic Obstructive Pulmonary Disease (COPD); Sleep Apnea Negative for: Aspiration; Asthma; Pneumothorax; Tuberculosis Past Medical History Notes: home O2 at night as needed Cardiovascular Complaints and Symptoms: Negative for: Chest pain; LE edema Medical History: Positive for: Congestive Heart Failure; Coronary Artery Disease; Hypertension; Peripheral Venous Disease Negative for: Angina; Arrhythmia; Deep Vein Thrombosis; Hypotension; Myocardial Infarction; Peripheral Arterial Disease; Phlebitis; Vasculitis Past Medical History Notes: varicose veins Psychiatric Complaints and Symptoms: Negative for: Anxiety; Claustrophobia Medical History: Negative for: Anorexia/bulimia; Confinement Anxiety Eyes Medical History: Negative for: Cataracts; Glaucoma; Optic Neuritis Ear/Nose/Mouth/Throat Medical History: Negative for: Chronic sinus problems/congestion; Middle ear problems Hematologic/Lymphatic ZHANE, BLUITT ([id number]) Medical History: Positive for: Anemia - aplastic anemia; Lymphedema Negative for: Hemophilia; Human Immunodeficiency Virus; Sickle Cell Disease Gastrointestinal Medical History: Negative for: Cirrhosis ; Colitis; Crohnos; Hepatitis A; Hepatitis B; Hepatitis C Endocrine Medical History: Positive for: Type II Diabetes Negative for: Type I Diabetes Time with diabetes: since 2006 Treated with: Insulin Blood sugar tested every day: Yes Tested : Blood sugar testing results: Breakfast: 209 Genitourinary Medical History: Negative for: End Stage Renal Disease Immunological Medical History: Negative for: Lupus Erythematosus; Raynaudos;  Scleroderma Integumentary (Skin) Medical History: Negative for: History of Burn; History of pressure wounds Musculoskeletal Medical History: Positive for: Osteoarthritis Negative for: Gout; Rheumatoid Arthritis; Osteomyelitis Neurologic Medical History: Positive for: Neuropathy Negative for: Dementia; Quadriplegia; Paraplegia; Seizure Disorder Past Medical History Notes: CVA - 1992 Oncologic Medical History: Positive for: Received Chemotherapy - last dose 2006 Negative for: Received Radiation Immunizations Pneumococcal Vaccine: Received Pneumococcal Vaccination: Yes PAL, SHELL ([id number]) Implantable Devices No devices added Hospitalization / Surgery History Type of Hospitalization/Surgery bronchitis Family and Social History Cancer: Yes - Paternal Grandparents; Diabetes: No; Heart Disease: No; Hereditary Spherocytosis: No; Hypertension: No; Kidney Disease: Yes - Siblings; Lung Disease: Yes - Mother; Seizures: No; Stroke: No; Thyroid Problems: No; Tuberculosis: No; Never smoker; Marital Status - Married; Alcohol Use: Never; Drug Use: No History; Caffeine Use: Never; Financial Concerns: No; Food, Clothing or Shelter Needs: No; Support System Lacking: No; Transportation Concerns: No Physician Affirmation I have reviewed and agree with the above information. Electronic Signature(s) Signed: 04/10/2019 6:06:55 PM By: Worthy Keeler PA-C Signed: 04/23/2019 4:47:24 PM By: Montey Hora Entered By: Worthy Keeler on 04/10/2019 10:14:36 [name] ([id number]) -------------------------------------------------------------------------------- SuperBill Details Patient Name: [name] Date of Service: 04/08/2019 Medical Record Number: [id number] Patient Account Number: [id number] Date of Birth/Sex: 1945/09/30 (73 y.o. M) Treating RN: Montey Hora Primary Care Provider: [name] Other Clinician: Referring Provider: [name] Treating Provider/Extender: Melburn Hake, [name]: 3 Diagnosis Coding ICD-10 Codes Code Description I89.0 Lymphedema, not elsewhere classified E11.621 Type 2 diabetes mellitus with foot  ulcer L97.512 Non-pressure chronic ulcer of other part of right foot with fat layer exposed L97.822 Non-pressure chronic ulcer of other part of left lower leg with fat layer exposed E66.09 Other obesity due to excess calories Facility Procedures CPT4 Code: 00379444 Description: 61901 - DEB SUBQ TISSUE 20 SQ CM/< ICD-10 Diagnosis Description L97.512 Non-pressure chronic ulcer of other part of right foot with fat Modifier: layer exposed Quantity: 1 Physician Procedures CPT4 Code: 2224114 Description: 11042 - WC PHYS SUBQ TISS 20 SQ CM ICD-10 Diagnosis Description L97.512 Non-pressure chronic ulcer of other part of right foot with fat Modifier: layer exposed Quantity: 1 Electronic Signature(s) Signed: 04/10/2019 10:17:22 AM By: Worthy Keeler PA-C Entered By: Worthy Keeler on 04/10/2019 10:17:21

## 2019-04-09 NOTE — Progress Notes (Signed)
DRAPER, GALLON ([id number]) Visit Report for 04/08/2019 Arrival Information Details Patient Name: Adam Merritt, Adam Merritt Date of Service: 04/08/2019 3:45 PM Medical Record Number: [id number] Patient Account Number: [id number] Date of Birth/Sex: [birth year] (73 y.o. M) Treating RN: Montey Hora Primary Care Elray Dains: [name] Other Clinician: Referring Navil Kole: [name] Treating Victoriah Wilds/Extender: Melburn Hake, [name]: 3 Visit Information History Since Last Visit Added or deleted any medications: No Patient Arrived: Ambulatory Any new allergies or adverse reactions: No Arrival Time: 15:53 Had a fall or experienced change in No Accompanied By: wife activities of daily living that may affect Transfer Assistance: None risk of falls: Patient Identification Verified: Yes Signs or symptoms of abuse/neglect since last visito No Secondary Verification Process Yes Hospitalized since last visit: No Completed: Implantable device outside of the clinic excluding No Patient Has Alerts: Yes cellular tissue based products placed in the center Patient Alerts: Patient on Blood since last visit: Thinner Has Dressing in Place as Prescribed: Yes DMII Pain Present Now: Yes aspirin 325 ABI 10/22/17 L 1.15 R .98 TBI L .66 R .61 Electronic Signature(s) Signed: 04/08/2019 4:39:43 PM By: Lorine Bears RCP, RRT, CHT Entered By: Lorine Bears on 04/08/2019 15:55:03 Adam Merritt ([id number]) -------------------------------------------------------------------------------- Encounter Discharge Information Details Patient Name: Adam Merritt Date of Service: 04/08/2019 3:45 PM Medical Record Number: [id number] Patient Account Number: [id number] Date of Birth/Sex: 1945/12/26 (73 y.o. M) Treating RN: Montey Hora Primary Care Lashawn Orrego: [name] Other Clinician: Referring Bowyn Mercier: [name] Treating Bryttney Netzer/Extender: Melburn Hake, [name]:  3 Encounter Discharge Information Items Post Procedure Vitals Discharge Condition: Stable Temperature (F): 98.6 Ambulatory Status: Cane Pulse (bpm): 74 Discharge Destination: Home Respiratory Rate (breaths/min): 18 Transportation: Private Auto Blood Pressure (mmHg): 153/69 Accompanied By: spouse Schedule Follow-up Appointment: Yes Clinical Summary of Care: Electronic Signature(s) Signed: 04/08/2019 4:57:37 PM By: Montey Hora Entered By: Montey Hora on 04/08/2019 16:33:17 Adam Merritt ([id number]) -------------------------------------------------------------------------------- Lower Extremity Assessment Details Patient Name: Adam Merritt Date of Service: 04/08/2019 3:45 PM Medical Record Number: [id number] Patient Account Number: [id number] Date of Birth/Sex: September 05, 1945 (73 y.o. M) Treating RN: Harold Barban Primary Care Edna Rede: [name] Other Clinician: Referring Pharrah Rottman: [name] Treating Jacquel Redditt/Extender: Melburn Hake, [name]: 3 Electronic Signature(s) Signed: 04/08/2019 4:24:18 PM By: Harold Barban Entered By: Harold Barban on 04/08/2019 16:00:32 Adam Merritt ([id number]) -------------------------------------------------------------------------------- Multi Wound Chart Details Patient Name: Adam Merritt Date of Service: 04/08/2019 3:45 PM Medical Record Number: [id number] Patient Account Number: [id number] Date of Birth/Sex: September 20, 1945 (73 y.o. M) Treating RN: Montey Hora Primary Care Krithika Tome: [name] Other Clinician: Referring Jayme Mednick: [name] Treating Keya Wynes/Extender: Melburn Hake, [name]: 3 Vital Signs Height(in): 31 Pulse(bpm): 53 Weight(lbs): 297 Blood Pressure(mmHg): 153/69 Body Mass Index(BMI): 48 Temperature(F): 98.6 Respiratory Rate 18 (breaths/min): Photos: [N/A:N/A] Wound Location: Right Toe Second - Distal N/A N/A Wounding Event: Gradually Appeared N/A N/A Primary Etiology:  Diabetic Wound/Ulcer of the N/A N/A Lower Extremity Comorbid History: Anemia, Lymphedema, N/A N/A Chronic Obstructive Pulmonary Disease (COPD), Sleep Apnea, Congestive Heart Failure, Coronary Artery Disease, Hypertension, Peripheral Venous Disease, Type II Diabetes, Osteoarthritis, Neuropathy, Received Chemotherapy Date Acquired: 03/08/2019 N/A N/A Weeks of Treatment: 3 N/A N/A Wound Status: Open N/A N/A Measurements L x W x D 0.6x0.4x0.1 N/A N/A (cm) Area (cm) : 0.188 N/A N/A Volume (cm) : 0.019 N/A N/A % Reduction in Area: 75.80% N/A N/A % Reduction in Volume: 75.60% N/A N/A Classification: Grade 2 N/A N/A Exudate Amount:  Medium N/A N/A Exudate Type: Serous N/A N/A Exudate Color: amber N/A N/A Wound Margin: Flat and Intact N/A N/A Granulation Amount: Medium (34-66%) N/A N/A LOWEN, BARRINGER ([id number]) Granulation Quality: Red N/A N/A Necrotic Amount: Small (1-33%) N/A N/A Exposed Structures: Fat Layer (Subcutaneous N/A N/A Tissue) Exposed: Yes Fascia: No Tendon: No Muscle: No Joint: No Bone: No Epithelialization: Small (1-33%) N/A N/A Treatment Notes Electronic Signature(s) Signed: 04/08/2019 4:57:37 PM By: Montey Hora Entered By: Montey Hora on 04/08/2019 16:24:44 Adam Merritt ([id number]) -------------------------------------------------------------------------------- [place] Details Patient Name: Adam Merritt Date of Service: 04/08/2019 3:45 PM Medical Record Number: [id number] Patient Account Number: [id number] Date of Birth/Sex: 07-18-1945 (73 y.o. M) Treating RN: Montey Hora Primary Care Uchenna Seufert: [name] Other Clinician: Referring Janeth Terry: [name] Treating Alleyne Lac/Extender: Melburn Hake, [name]: 3 Active Inactive Abuse / Safety / Falls / Self Care Management Nursing Diagnoses: History of Falls Goals: Patient will remain injury free related to falls Date Initiated: 03/18/2019 Target Resolution  Date: 06/14/2019 Goal Status: Active Interventions: Assess fall risk on admission and as needed Notes: Orientation to the Wound Care Program Nursing Diagnoses: Knowledge deficit related to the wound healing center program Goals: Patient/caregiver will verbalize understanding of the [facility] Date Initiated: 03/18/2019 Target Resolution Date: 06/14/2019 Goal Status: Active Interventions: Provide education on orientation to the wound center Notes: Pressure Nursing Diagnoses: Knowledge deficit related to management of pressures ulcers Goals: Patient will remain free of pressure ulcers Date Initiated: 03/18/2019 Target Resolution Date: 06/14/2019 Goal Status: Active Interventions: Assess: immobility, friction, shearing, incontinence upon admission and as needed JERRIAN, MELLS ([id number]) Notes: Venous Leg Ulcer Nursing Diagnoses: Actual venous Insuffiency (use after diagnosis is confirmed) Goals: Patient will maintain optimal edema control Date Initiated: 03/18/2019 Target Resolution Date: 06/14/2019 Goal Status: Active Interventions: Assess peripheral edema status every visit. Compression as ordered Notes: Wound/Skin Impairment Nursing Diagnoses: Impaired tissue integrity Goals: Ulcer/skin breakdown will heal within 14 weeks Date Initiated: 03/18/2019 Target Resolution Date: 06/14/2019 Goal Status: Active Interventions: Assess patient/caregiver ability to obtain necessary supplies Assess patient/caregiver ability to perform ulcer/skin care regimen upon admission and as needed Assess ulceration(s) every visit Notes: Electronic Signature(s) Signed: 04/08/2019 4:57:37 PM By: Montey Hora Entered By: Montey Hora on 04/08/2019 16:24:20 Adam Merritt ([id number]) -------------------------------------------------------------------------------- Pain Assessment Details Patient Name: Adam Merritt Date of Service: 04/08/2019 3:45 PM Medical Record  Number: [id number] Patient Account Number: [id number] Date of Birth/Sex: 03/05/1946 (73 y.o. M) Treating RN: Montey Hora Primary Care Garlan Drewes: [name] Other Clinician: Referring Bomani Oommen: [name] Treating Samina Weekes/Extender: Melburn Hake, [name]: 3 Active Problems Location of Pain Severity and Description of Pain Patient Has Paino Yes Site Locations Rate the pain. Current Pain Level: 1 Pain Management and Medication Current Pain Management: Electronic Signature(s) Signed: 04/08/2019 4:39:43 PM By: Lorine Bears RCP, RRT, CHT Signed: 04/08/2019 4:57:37 PM By: Montey Hora Entered By: Lorine Bears on 04/08/2019 15:55:14 Adam Merritt ([id number]) -------------------------------------------------------------------------------- Patient/Caregiver Education Details Patient Name: Adam Merritt Date of Service: 04/08/2019 3:45 PM Medical Record Number: [id number] Patient Account Number: [id number] Date of Birth/Gender: [birth year] (73 y.o. M) Treating RN: Montey Hora Primary Care Physician: [name] Other Clinician: Referring Physician: [name] Treating Physician/Extender: Sharalyn Ink in Treatment: 3 Education Assessment Education Provided To: Patient and Caregiver Education Topics Provided Wound/Skin Impairment: Handouts: Other: wound care as ordered Methods: Demonstration, Explain/Verbal Responses: State content correctly Electronic Signature(s) Signed: 04/08/2019 4:57:37 PM By: Montey Hora  Entered By: Montey Hora on 04/08/2019 16:32:07 Adam Merritt ([id number]) -------------------------------------------------------------------------------- Wound Assessment Details Patient Name: ISIAAH, CUERVO Date of Service: 04/08/2019 3:45 PM Medical Record Number: [id number] Patient Account Number: [id number] Date of Birth/Sex: 11-Nov-1945 (73 y.o. M) Treating RN: Harold Barban Primary Care Ronson Hagins: [name] Other Clinician: Referring Aziz Slape: [name] Treating Tarrie Mcmichen/Extender: Melburn Hake, [name]: 3 Wound Status Wound Number: 24 Primary Diabetic Wound/Ulcer of the Lower Extremity Etiology: Wound Location: Right Toe Second - Distal Wound Open Wounding Event: Gradually Appeared Status: Date Acquired: 03/08/2019 Comorbid Anemia, Lymphedema, Chronic Obstructive Weeks Of Treatment: 3 History: Pulmonary Disease (COPD), Sleep Apnea, Clustered Wound: No Congestive Heart Failure, Coronary Artery Disease, Hypertension, Peripheral Venous Disease, Type II Diabetes, Osteoarthritis, Neuropathy, Received Chemotherapy Photos Wound Measurements Length: (cm) 0.6 % Reduction in Width: (cm) 0.4 % Reduction in Depth: (cm) 0.1 Epithelializat Area: (cm) 0.188 Tunneling: Volume: (cm) 0.019 Undermining: Area: 75.8% Volume: 75.6% ion: Small (1-33%) No No Wound Description Classification: Grade 2 Foul Odor Aft Wound Margin: Flat and Intact Slough/Fibrin Exudate Amount: Medium Exudate Type: Serous Exudate Color: amber er Cleansing: No o Yes Wound Bed Granulation Amount: Medium (34-66%) Exposed Structure Granulation Quality: Red Fascia Exposed: No Necrotic Amount: Small (1-33%) Fat Layer (Subcutaneous Tissue) Exposed: Yes Necrotic Quality: Adherent Slough Tendon Exposed: No Muscle Exposed: No Joint Exposed: No Bone Exposed: No SHAKIL, DIRK ([id number]) Treatment Notes Wound #24 (Right, Distal Toe Second) Notes prisma, gauze, conform Electronic Signature(s) Signed: 04/08/2019 4:24:18 PM By: Harold Barban Entered By: Harold Barban on 04/08/2019 16:00:13 Adam Merritt ([id number]) -------------------------------------------------------------------------------- Vitals Details Patient Name: Adam Merritt Date of Service: 04/08/2019 3:45 PM Medical Record Number: [id number] Patient Account Number: [id number] Date of Birth/Sex: [birth year] (73 y.o.  M) Treating RN: Montey Hora Primary Care Timea Breed: [name] Other Clinician: Referring Hopie Pellegrin: [name] Treating Gerrie Castiglia/Extender: Melburn Hake, [name]: 3 Vital Signs Time Taken: 15:55 Temperature (F): 98.6 Height (in): 66 Pulse (bpm): 74 Weight (lbs): 297 Respiratory Rate (breaths/min): 18 Body Mass Index (BMI): 47.9 Blood Pressure (mmHg): 153/69 Reference Range: 80 - 120 mg / dl Electronic Signature(s) Signed: 04/08/2019 4:24:18 PM By: Harold Barban Entered By: Harold Barban on 04/08/2019 15:57:52

## 2019-04-10 ENCOUNTER — Encounter: Payer: Self-pay | Admitting: Nurse Practitioner

## 2019-04-10 ENCOUNTER — Ambulatory Visit (INDEPENDENT_AMBULATORY_CARE_PROVIDER_SITE_OTHER): Payer: Medicare Other | Admitting: Nurse Practitioner

## 2019-04-10 ENCOUNTER — Ambulatory Visit: Payer: Medicare Other | Attending: Family | Admitting: Family

## 2019-04-10 ENCOUNTER — Encounter: Payer: Self-pay | Admitting: Family

## 2019-04-10 ENCOUNTER — Other Ambulatory Visit: Payer: Self-pay

## 2019-04-10 VITALS — BP 145/78 | HR 73 | Temp 97.2°F | Resp 16 | Ht 66.0 in | Wt 291.8 lb

## 2019-04-10 VITALS — BP 136/87 | HR 69 | Resp 18 | Ht 66.0 in | Wt 278.0 lb

## 2019-04-10 DIAGNOSIS — I25810 Atherosclerosis of coronary artery bypass graft(s) without angina pectoris: Secondary | ICD-10-CM | POA: Insufficient documentation

## 2019-04-10 DIAGNOSIS — E1140 Type 2 diabetes mellitus with diabetic neuropathy, unspecified: Secondary | ICD-10-CM | POA: Diagnosis not present

## 2019-04-10 DIAGNOSIS — J449 Chronic obstructive pulmonary disease, unspecified: Secondary | ICD-10-CM

## 2019-04-10 DIAGNOSIS — Z833 Family history of diabetes mellitus: Secondary | ICD-10-CM | POA: Diagnosis not present

## 2019-04-10 DIAGNOSIS — Z794 Long term (current) use of insulin: Secondary | ICD-10-CM | POA: Diagnosis not present

## 2019-04-10 DIAGNOSIS — E1142 Type 2 diabetes mellitus with diabetic polyneuropathy: Secondary | ICD-10-CM | POA: Diagnosis not present

## 2019-04-10 DIAGNOSIS — Z9049 Acquired absence of other specified parts of digestive tract: Secondary | ICD-10-CM | POA: Diagnosis not present

## 2019-04-10 DIAGNOSIS — I110 Hypertensive heart disease with heart failure: Secondary | ICD-10-CM | POA: Insufficient documentation

## 2019-04-10 DIAGNOSIS — I890 Lymphedema, not elsewhere classified: Secondary | ICD-10-CM | POA: Diagnosis not present

## 2019-04-10 DIAGNOSIS — Z8673 Personal history of transient ischemic attack (TIA), and cerebral infarction without residual deficits: Secondary | ICD-10-CM | POA: Diagnosis not present

## 2019-04-10 DIAGNOSIS — E1165 Type 2 diabetes mellitus with hyperglycemia: Secondary | ICD-10-CM | POA: Diagnosis not present

## 2019-04-10 DIAGNOSIS — Z8349 Family history of other endocrine, nutritional and metabolic diseases: Secondary | ICD-10-CM | POA: Insufficient documentation

## 2019-04-10 DIAGNOSIS — G894 Chronic pain syndrome: Secondary | ICD-10-CM | POA: Diagnosis not present

## 2019-04-10 DIAGNOSIS — I5032 Chronic diastolic (congestive) heart failure: Secondary | ICD-10-CM | POA: Diagnosis not present

## 2019-04-10 DIAGNOSIS — I10 Essential (primary) hypertension: Secondary | ICD-10-CM

## 2019-04-10 DIAGNOSIS — Z79899 Other long term (current) drug therapy: Secondary | ICD-10-CM | POA: Insufficient documentation

## 2019-04-10 DIAGNOSIS — E785 Hyperlipidemia, unspecified: Secondary | ICD-10-CM | POA: Diagnosis not present

## 2019-04-10 DIAGNOSIS — Z7982 Long term (current) use of aspirin: Secondary | ICD-10-CM | POA: Diagnosis not present

## 2019-04-10 DIAGNOSIS — Z951 Presence of aortocoronary bypass graft: Secondary | ICD-10-CM | POA: Diagnosis not present

## 2019-04-10 DIAGNOSIS — Z8249 Family history of ischemic heart disease and other diseases of the circulatory system: Secondary | ICD-10-CM | POA: Diagnosis not present

## 2019-04-10 DIAGNOSIS — Z89421 Acquired absence of other right toe(s): Secondary | ICD-10-CM | POA: Insufficient documentation

## 2019-04-10 DIAGNOSIS — N1832 Chronic kidney disease, stage 3b: Secondary | ICD-10-CM

## 2019-04-10 NOTE — Patient Instructions (Addendum)
Resume weighing daily and call for an overnight weight gain of > 2 pounds or a weekly weight gain of >5 pounds. 

## 2019-04-10 NOTE — Progress Notes (Signed)
[facility] [place], [place] [address]  Internal MEDICINE  Office Visit Note  Patient Name: Adam Merritt  [id number]  [id number]  Date of Service: 04/23/2019  Chief Complaint  Patient presents with  . Diabetes  . Hypertension  . Hyperlipidemia    The patient is here for follow up visit. He states that he is doing very well. Has stopped using fentanyl patch altogether. Has been about two weeks without it. Taking just gabapentin 300mg , two capsules in the morning and two capsules in the evening. States that his legs and feet are doing better than they have in some time. Has more energy. He is getting around better. He continues to see cardiology, wound care, and vein and vascular.       Current Medication: Outpatient Encounter Medications as of 04/10/2019  Medication Sig Note  . albuterol (PROVENTIL HFA;VENTOLIN HFA) 108 (90 Base) MCG/ACT inhaler INHALE 1 PUFF INTO THE LUNGS EVERY 6 HOURS AS NEEDED FOR WHEEZING OR SHORTNESS OF BREATH (Patient taking differently: Inhale 1 puff into the lungs every 6 (six) hours as needed for wheezing or shortness of breath. )   . aspirin 325 MG EC tablet Take 325 mg by mouth daily.   Marland Kitchen atorvastatin (LIPITOR) 40 MG tablet Take 40 mg by mouth at bedtime.    . carvedilol (COREG) 25 MG tablet Take 25 mg by mouth 2 (two) times daily with a meal.   . docusate sodium (COLACE) 50 MG capsule Take 50 mg by mouth 2 (two) times daily.   . febuxostat (ULORIC) 40 MG tablet Take 1 tablet (40 mg total) by mouth daily.   . furosemide (LASIX) 40 MG tablet Take 1 tablet (40 mg total) by mouth daily.   Marland Kitchen gabapentin (NEURONTIN) 300 MG capsule Take 2 cap in the morning, 1 cap in the evening and 2 cap at night.   . hydrALAZINE (APRESOLINE) 25 MG tablet    . indapamide (LOZOL) 2.5 MG tablet Take 1 tablet (2.5 mg total) by mouth daily.   . Insulin Glargine (BASAGLAR KWIKPEN) 100 UNIT/ML SOPN INJECT 30 TO 36 UNITS UNDER THE SKIN EVERY DAY   .  Insulin Pen Needle (BD PEN NEEDLE NANO U/F) 32G X 4 MM MISC Use as directed with insulin DX e11.65   . ipratropium-albuterol (DUONEB) 0.5-2.5 (3) MG/3ML SOLN Take 3 mLs by nebulization every 4 (four) hours as needed (for shortness of breath).   . metolazone (ZAROXOLYN) 2.5 MG tablet    . Multiple Vitamin (MULTIVITAMIN WITH MINERALS) TABS tablet Take 1 tablet by mouth daily.   . ondansetron (ZOFRAN) 4 MG tablet Take 1 tablet (4 mg total) by mouth every 8 (eight) hours as needed.   Adam Merritt Adam Merritt Lancets 00F MISC Use twice daily diag e11.65   . pentoxifylline (TRENTAL) 400 MG CR tablet TAKE 1 TABLET BY MOUTH THREE TIMES DAILY FOR CLAUDICATION   . prazosin (MINIPRESS) 2 MG capsule Take 2 mg by mouth at bedtime.    . sennosides-docusate sodium (SENOKOT-S) 8.6-50 MG tablet Take 1-2 tablets by mouth 2 (two) times daily.   . sertraline (ZOLOFT) 100 MG tablet    . traZODone (DESYREL) 50 MG tablet Take 50 mg by mouth at bedtime.   Marland Kitchen VICTOZA 18 MG/3ML SOPN INJECT 1.8MG  INTO THE SKIN DAILY   . [DISCONTINUED] fentaNYL (DURAGESIC) 100 MCG/HR Place 1 patch onto the skin every 3 (three) days. 04/10/2019: the patient is currently not using the fentanyl patch.   . [DISCONTINUED] potassium  chloride SA (K-DUR) 20 MEQ tablet Take 1 tablet (20 mEq total) by mouth 3 (three) times daily. (Patient taking differently: Take 20 mEq by mouth 2 (two) times daily. )    No facility-administered encounter medications on file as of 04/10/2019.     Surgical History: Past Surgical History:  Procedure Laterality Date  . 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 CHOLECYSTECTOMY    . CORONARY ARTERY BYPASS GRAFT      Medical History: Past Medical History:  Diagnosis Date  . Anemia   . CAD (coronary artery disease)   . Chest pain   . Coronary artery disease   . Diabetes mellitus without complication ([place])   .  Diastolic CHF ([place])   . Esophageal reflux   . Herpes zoster without mention of complication   . Hyperlipidemia   . Hypertension   . Insomnia   . Lumbago   . Lymphedema   . Neuropathy in diabetes ([place])   . Obstructive chronic bronchitis without exacerbation ([place])   . Other malaise and fatigue   . Stroke ([place])   . Varicose veins     Family History: Family History  Problem Relation Age of Onset  . Diabetes Mother   . Hyperlipidemia Mother   . Hypertension Mother   . Diabetes Father   . Hyperlipidemia Father   . Hypertension Father   . CAD Father     Social History   Socioeconomic History  . Marital status: Married    Spouse name: Not on file  . Number of children: Not on file  . Years of education: Not on file  . Highest education level: Not on file  Occupational History  . Occupation: retired  Scientific laboratory technician  . Financial resource strain: Not hard at all  . Food insecurity    Worry: Never true    Inability: Never true  . Transportation needs    Medical: No    Non-medical: No  Tobacco Use  . Smoking status: Never Smoker  . Smokeless tobacco: Never Used  Substance and Sexual Activity  . Alcohol use: No    Comment: quit alcohol 30 years ago  . Drug use: No  . Sexual activity: Not Currently  Lifestyle  . Physical activity    Days per week: 0 days    Minutes per session: 0 min  . Stress: Only a little  Relationships  . Social connections    Talks on phone: More than three times a week    Gets together: More than three times a week    Attends religious service: More than 4 times per year    Active member of club or organization: No    Attends meetings of clubs or organizations: Never    Relationship status: Married  . Intimate partner violence    Fear of current or ex partner: No    Emotionally abused: No    Physically abused: No    Forced sexual activity: No  Other Topics Concern  . Not on file  Social History Narrative   Lives at home with wife, uses a  wheelchair now      Review of Systems  Constitutional: Negative for activity change and fatigue.  HENT: Negative for congestion and postnasal drip.   Respiratory: Positive for shortness of breath. Negative for cough and chest tightness.   Cardiovascular: Positive for leg swelling. Negative for chest  pain and palpitations.       Leg swelling has improved since his last visit .  Gastrointestinal: Negative for constipation, diarrhea, nausea and vomiting.  Endocrine: Negative for cold intolerance, heat intolerance, polydipsia and polyuria.       Blood sugars doing well   Musculoskeletal: Positive for arthralgias, back pain and myalgias.  Skin: Positive for rash and wound.       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 Continues to see wound care provider for this .  Allergic/Immunologic: Positive for environmental allergies.  Neurological: Negative for dizziness, light-headedness and headaches.  Hematological: Negative for adenopathy.  Psychiatric/Behavioral: Positive for dysphoric mood. Negative for agitation. The patient is nervous/anxious.        Improved. [facility]ression.   Today's Vitals   04/10/19 1012  BP: (!) 145/78  Pulse: 73  Resp: 16  Temp: (!) 97.2 F (36.2 C)  SpO2: 97%  Weight: 291 lb 12.8 oz (132.4 kg)  Height: 5\' 6"  (1.676 m)   Body mass index is 47.1 kg/m.  Physical Exam Vitals signs and nursing note reviewed.  Constitutional:      General: He is not in acute distress.    Appearance: He is well-developed. He is obese. He is not ill-appearing or diaphoretic.  HENT:     Head: Normocephalic and atraumatic.     Nose: Nose normal.     Mouth/Throat:     Pharynx: No oropharyngeal exudate.  Eyes:     Conjunctiva/sclera: Conjunctivae normal.     Pupils: Pupils are equal, round, and reactive to light.  Neck:     Musculoskeletal: Normal range of motion and neck supple.     Thyroid: No thyromegaly.     Vascular: No JVD.     Trachea:  No tracheal deviation.  Cardiovascular:     Rate and Rhythm: Normal rate and regular rhythm.     Heart sounds: Normal heart sounds. No murmur. No friction rub. No gallop.   Pulmonary:     Effort: Pulmonary effort is normal. No respiratory distress.     Breath sounds: No wheezing, rhonchi or rales.     Comments: Congested cough.  Chest:     Chest wall: No tenderness.  Musculoskeletal: Normal range of motion.     Comments: Moderate to severe lower leg pain, bilaterally. This seems to have improved a great deal since his last visit.   Lymphadenopathy:     Cervical: No cervical adenopathy.  Skin:    General: Skin is warm and dry.     Comments: Bilateral lower extremities wrapped in clean dry, pressure dressings, applied per wound management.   Neurological:     Mental Status: He is alert and oriented to person, place, and time. Mental status is at baseline.     Cranial Nerves: No cranial nerve deficit.  Psychiatric:        Behavior: Behavior normal.        Thought Content: Thought content normal.        Judgment: Judgment normal.    Assessment/Plan: 1. Type 2 diabetes mellitus with hyperglycemia, with long-term current use of insulin (HCC) Blood sugars are stable. Continue diabetic medicatoin as prescribed   2. Diabetic polyneuropathy associated with type 2 diabetes mellitus (HCC) Continue gabapentin as prescribed   3. Chronic obstructive pulmonary disease, unspecified COPD type ([place]) Stable. Continue inhalers and respiratory medications as prescribed.   4. Chronic pain disorder Improving. Patient no longer using fentanyl patches. Will monitor.  5. Essential hypertension Stable. Continue bp medication as prescribed.   General Counseling: Eloy verbalizes understanding of the findings of todays visit and agrees with plan of treatment. I have discussed any further diagnostic evaluation that may be needed or ordered today. We also reviewed his medications today. he has been  encouraged to call the office with any questions or concerns that should arise related to todays visit.  Hypertension Counseling:   The following hypertensive lifestyle modification were recommended and discussed:  1. Limiting alcohol intake to less than 1 oz/day of ethanol:(24 oz of beer or 8 oz of wine or 2 oz of 100-proof whiskey). 2. Take baby ASA 81 mg daily. 3. Importance of regular aerobic exercise and losing weight. 4. Reduce dietary saturated fat and cholesterol intake for overall cardiovascular health. 5. Maintaining adequate dietary potassium, calcium, and magnesium intake. 6. Regular monitoring of the blood pressure. 7. Reduce sodium intake to less than 100 mmol/day (less than 2.3 gm of sodium or less than 6 gm of sodium choride)   This patient was seen by [place] with Dr [name] as a part of collaborative care agreement   Time spent: 29 Minutes      Dr [name] Internal medicine

## 2019-04-11 ENCOUNTER — Other Ambulatory Visit: Payer: Self-pay | Admitting: Family

## 2019-04-11 DIAGNOSIS — I890 Lymphedema, not elsewhere classified: Secondary | ICD-10-CM

## 2019-04-15 ENCOUNTER — Other Ambulatory Visit: Payer: Self-pay

## 2019-04-15 ENCOUNTER — Encounter: Payer: Medicare Other | Admitting: Physician Assistant

## 2019-04-15 DIAGNOSIS — E11622 Type 2 diabetes mellitus with other skin ulcer: Secondary | ICD-10-CM | POA: Diagnosis not present

## 2019-04-15 NOTE — Progress Notes (Addendum)
Merritt, Adam ([id number]) Visit Report for 04/15/2019 Chief Complaint Document Details Patient Name: Adam Merritt, Adam Merritt Date of Service: 04/15/2019 9:30 AM Medical Record Number: [id number] Patient Account Number: [id number] Date of Birth/Sex: [birth year] (73 y.o. M) Treating RN: Montey Hora Primary Care Provider: [name] Other Clinician: Referring Provider: [name] Treating Provider/Extender: Melburn Hake, [name]: 4 Information Obtained from: Patient Chief Complaint Right toe and left LE Ulcer Electronic Signature(s) Signed: 04/15/2019 9:52:32 AM By: Worthy Keeler PA-C Entered By: Worthy Keeler on 04/15/2019 09:52:31 Adam Merritt ([id number]) -------------------------------------------------------------------------------- HPI Details Patient Name: Adam Merritt Date of Service: 04/15/2019 9:30 AM Medical Record Number: [id number] Patient Account Number: [id number] Date of Birth/Sex: September 13, 1945 (73 y.o. M) Treating RN: Montey Hora Primary Care Provider: [name] Other Clinician: Referring Provider: [name] Treating Provider/Extender: Melburn Hake, [name]: 4 History of Present Illness HPI Description: 10/18/17-He is here for initial evaluation of bilateral lower extremity ulcerations in the presence of venous insufficiency and lymphedema. He has been seen by vascular medicine in the past, Dr. Lucky Cowboy, last seen in 2016. He does have a history of abnormal ABIs, which is to be expected given his lymphedema and venous insufficiency. According to Epic, it appears that all attempts for arterial evaluation and/or angiography were not follow through with by patient. He does have a history of being seen in lymphedema clinic in 2018, stopped going approximately 6 months ago stating "it didn't do any good". He does not have lymphedema pumps, he does not have custom fit compression wrap/stockings. He is diabetic and his recent A1c last month was  7.6. He admits to chronic bilateral lower extremity pain, no change in pain since blister and ulceration development. He is currently being treated with Levaquin for bronchitis. He has home health and we will continue. 10/25/17-He is here in follow-up evaluation for bilateral lower extremity ulcerationssubtle he remains on Levaquin for bronchitis. Right lower extremity with no evidence of drainage or ulceration, persistent left lower extremity ulceration. He states that home health has not been out since his appointment. He went to [facility] on Tuesday, studies revealed: RIGHT ABI 0.9, TBI 0.6 LEFT ABI 1.1, TBI 0.6 with triphasic flow bilaterally. We will continue his same treatment plan. He has been educated on compression therapy and need for elevation. He will benefit from lymphedema pumps 11/01/17-He is here in follow-up evaluation for left lower extremity ulcer. The right lower extremity remains healed. He has home health services, but they have not been out to see the patient for 2-3 weeks. He states it home health physical therapy changed his dressing yesterday after therapy; he placed Ace wrap compression. We are still waiting for lymphedema pumps, reordered d/t need for company change. 11/08/17-He is here in follow-up evaluation for left lower extremity ulcer. It is improved. Edema is significantly improved with compression therapy. We will continue with same treatment plan and he will follow-up next week. No word regarding lymphedema pumps 11/15/17-He is here in follow-up evaluation for left lower extremity ulcer. He is healed and will be discharged from wound care services. I have reached out to [facility] regarding his lymphedema pumps. They have been unable to reach the patient; the contact number they had with the patient's wife's cell phone and she has not answered any unrecognized calls. Contact should be made today, trial planned for next week; [facility]  will continue to follow 11/27/17 on evaluation today patient has multiple blistered areas over  the right lower extremity his left lower extremity appears to be doing okay. These blistered areas show signs of no infection which is great news. With that being said he did have some necrotic skin overlying which was mechanically debrided away with saline and gauze today without complication. Overall post debridement the wounds appear to be doing better but in general his swelling seems to be increased. This is obviously not good news. I think this is what has given rise to the blisters. 12/04/17 on evaluation today patient presents for follow-up concerning his bilateral lower extremity edema in the right lower extremity ulcers. He has been tolerating the dressing changes without complication. With that being said he has had no real issues with the wraps which is also good news. Overall I'm pleased with the progress he's been making. 12/11/17 on evaluation today patient appears to be doing rather well in regard to his right lateral lower extremity ulcer. He's been tolerating the dressing changes without complication. Fortunately there does not appear to be any evidence of infection at this time. Overall I'm pleased with the progress that is being made. Unfortunately he has been in the hospital due to having what sounds to be a stomach virus/flu fortunately that is starting to get better. 12/18/17 on evaluation today patient actually appears to be doing very well in regard to his bilateral lower extremities the swelling is under fairly good control his lymphedema pumps are still not up and running quite yet. With that being said he does have several areas of opening noted as far as wounds are concerned mainly over the left lower extremity. With that being said I do believe once he gets lymphedema pumps this would least help you mention some the fluid and preventing this from occurring. Hopefully that will be  set up soon sleeves are Artie in place at his home he just waiting for the machine. 12/25/17 on evaluation today patient actually appears to be doing excellent in fact all of his ulcers appear to have resolved his TALBERT, TREMBATH. ([id number]) legs appear very well. I do think he needs compression stockings we have discussed this and they are actually going to go to [place] today to elastic therapy to get this fitted for him. I think that is definitely a good thing to do. Readmission: 04/09/18 upon evaluation today this patient is seen for readmission due to bilateral lower extremity lymphedema. He has significant swelling of his extremities especially on the left although the right is also swollen he has weeping from both sides. There are no obvious open wounds at this point. Fortunately he has been doing fairly well for quite a bit of time since I last saw him. Nonetheless unfortunately this seems to have reopened and is giving quite a bit of trouble. He states this began about a week ago when he first called [place] to get in to be seen. No fevers, chills, nausea, or vomiting noted at this time. He has not been using his lymphedema pumps due to the fact that they won't fit on his leg at this point likewise is also not been using his compression for essentially the same reason. 04/16/18 upon evaluation today patient actually appears to be doing a little better in regard to the fluid in his bilateral lower extremities. With that being said he's had three falls since I saw him last week. He also states that he's been feeling very poorly. I was concerned last week and feel like that the concern is still  there as far as the congestion in his chest is concerned he seems to be breathing about the same as last week but again he states he's very weak he's not even able to walk further than from the chair to the door. His wife had to buy a wheelchair just to be able to get them out of the house to get to  the appointment today. This has me very concerned. 04/23/18 on evaluation today patient actually appears to be doing much better than last week's evaluation. At that time actually had to transport him to the ER via EMS and he subsequently was admitted for acute pulmonary edema, acute renal failure, and acute congestive heart failure. Fortunately he is doing much better. Apparently they did dialyze him and were able to take off roughly 35 pounds of fluid. Nonetheless he is feeling much better both in regard to his breathing and he's able to get around much better at this time compared to previous. Overall I'm very happy with how things are at this time. There does not appear to be any evidence of infection currently. No fevers, chills, nausea, or vomiting noted at this time. 04/30/2018 patient seen today for follow-up and management of bilateral lower extremity lymphedema. He did express being more sad today than usual due to the recent loss of his dog. He states that he has been compliant with using the lymphedema pumps. However he does admit a minute over the last 2-3 days he has not been using the pumps due to the recent loss of his dog. At this time there is no drainage or open wounds to his lower extremities. The left leg edema is measuring smaller today. Still has a significant amount of edema on bilateral lower extremities With dry flaky skin. He will be referred to the lymphedema clinic for further management. Will continue 3 layer compression wraps and follow-up in 1-2 weeks.Denies any pain, fever, chairs recently. No recent falls or injuries reported during this visit. 05/07/18 on evaluation today patient actually appears to be doing very well in regard to his lower extremities in general all things considered. With that being said he is having some pain in the legs just due to the amount of swelling. He does have an area where he had a blister on the left lateral lower extremity this is  open at this point other than that there's nothing else weeping at this time. 05/14/18 on evaluation today patient actually appears to be doing excellent all things considered in regard to his lower extremities. He still has a couple areas of weeping on each leg which has continued to be the issue for him. He does have an appointment with the lymphedema clinic although this isn't until February 2020. That was the earliest they had. In the meantime he has continued to tolerate the compression wraps without complication. 05/28/18 on evaluation today patient actually appears to be doing more poorly in regard to his left lower extremity where he has a wound open at this point. He also had a fall where he subsequently injured his right great toe which has led to an open wound at the site unfortunately. He has been tolerating the dressing changes without complication in general as far as the wraps are concerned that he has not been putting any dressing on the left 1st toe ulcer site. 06/11/18 on evaluation today patient appears to be doing much worse in regard to his bilateral lower extremity ulcers. He has been tolerating the dressing changes  without complication although his legs have not been wrapped more recently. Overall I am not very pleased with the way his legs appear. I do believe he needs to be back in compression wraps he still has not received his compression wraps from the [facility] hospital as of yet. 06/18/18 on evaluation today patient actually appears to be doing significantly better than last time I saw him. He has been tolerating the compression wraps without complication in the circumferential ulcers especially appear to be doing much better. His toe ulcer on the right in regard to the great toe is better although not as good as the legs in my pinion. No fevers chills noted SALVATOR, SEPPALA ([id number]) 07/02/18 on evaluation today patient appears to be doing much better in regard to his lower  extremity ulcers. Unfortunately since I last saw him he's had the distal portion of his right great toe if he dated it sounds as if this actually went downhill very quickly. I had only seen him a few days prior and the toe did not appear to be infected at that point subsequently became infected very rapidly and it was decided by the surgeon that the distal portion of the toe needed to be removed. The patient seems to be doing well in this regard he tells me. With that being said his lower extremities are doing better from the standpoint of the wounds although he is significantly swollen at this point. 07/09/18 on evaluation today patient appears to be doing better in regard to the wounds on his lower extremities. In fact everything is almost completely healed he is just a small area on the left posterior lower extremity that is open at this point. 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 With that being said he needs some kind of compression it does sound like he may not be wearing his compression, that is the wraps, during the entire time between when he's here visit to visit. Apparently his wife took the current one off because it began to "fall apart". 07/16/18 on evaluation today patient appears to be extremely swollen especially in regard to his left lower extremity unfortunately. He also has a new skin tear over the left lower extremity and there's a smaller area on the right lower extremity as well. Unfortunately this seems to be due in part to blistering and fluid buildup in his leg. He did get the reduction wraps that were ordered by the [facility] hospital for him to go to lymphedema clinic. With that being said his wounds on the legs have not healed to the point to where they would likely accept them as a patient  lymphedema clinic currently. We need to try to get this to heal. With that being said he's been taking his wraps off which is not doing him any favors at this point. In fact this is probably quite counterproductive compared to what needs to occur. We will likely need to increase to a four layer compression wrap and continue to also utilize elevation and he has to keep the wraps on not take them off as he's been doing currently hasn't had a wrap on since Saturday. 07/23/18 on evaluation today patient appears to be doing much better in regard to his bilateral lower extremities. In fact his left lower extremity which was the largest is  actually 15 cm smaller today compared to what it was last time he was here in our clinic. This is obviously good news after just one week. Nonetheless the differences he actually kept the wraps on during the entire week this time. That's not typical for him. I do believe he understands a little bit better now the severity of the situation and why it's important for him to keep these wraps on. 07/30/18 on evaluation today patient actually appears to be doing rather well in regard to his lower extremities. His legs are much smaller than they have been in the past and he actually has only one very small rudder superficial region remaining that is not closed on the left lateral/posterior lower extremity even this is almost completely close. I do believe likely next week he will be healed without any complications. I do think we need to continue the wraps however this seems to be beneficial for him. I also think it may be a good time for [place] to go ahead and see about getting the appointment with the lymphedema clinic which is supposed to be made for him in order to keep this moving along and hopefully get them into compression wraps that will in the end help him to remain healed. 08/06/18 on evaluation today patient actually appears to be doing very well in regard as bilateral  lower extremities. In fact his wounds appear to be completely healed at this time. 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 He is in the process of getting appointment with the lymphedema clinic we have made this referral were just waiting to hear back on the schedule time. We need to follow up on that today as well. 08/13/18 on evaluation today patient actually appears to be doing very well in regard to his bilateral lower extremities there are no open wounds at this point. We are gonna go ahead and see about ordering the Velcro compression wraps for him had a discussion with them about [place] doing it versus the [place] they feel like they can definitely afford going ahead and get the wraps themselves and they would prefer to try to avoid having to go to the lymphedema clinic if it all possible which I completely understand. As long as he has good compression I'm okay either way. 3/17//20 on evaluation today patient actually appears to be doing well in regard to his bilateral lower extremity ulcers. He has been tolerating the dressing changes without complication specifically the compression wraps. Overall is had no issues my fingers finding that I see at this point is that he is having trouble with constipation. He tells me he has not been able to go to the bathroom for about six days. He's taken to over-the-counter oral laxatives unfortunately this is not helping. He has not contacted his doctor. 08/27/18 on evaluation today patient appears to be doing fairly well in regard to his lower extremities at this point. There does not appear to be any new altars is swelling is very well controlled. We are still waiting for his Velcro compression wraps to arrive that should be sometime in the next week is Artie sent the check for these. 09/03/18 on evaluation today patient appears to be doing excellent in regard to his bilateral lower extremities which  he shows SHAHIL, SPEEGLE. ([id number]) no signs of wound openings in regard to this point. He does have his Velcro compression wraps which did arrive in the mail since  I saw him last week. Overall he is doing excellent in my pinion. 09/13/18 on evaluation today patient appears to be doing very well currently in regard to the overall appearance of his bilateral lower extremities although he's a little bit more swollen than last time we saw him. At that point he been discharged without any open wounds. Nonetheless he has a small open wound on the posterior left lower extremity with some evidence of cellulitis noted as well. Fortunately I feel like he has made good progress overall with regard to his lower extremities from were things used to be. 09/20/18 on evaluation today patient actually appears to be doing much better. The erythematous lower extremity is improving wound itself which is still open appears to be doing much better as far as but appearance as well as pain is concerned overall very pleased in this regard. There's no signs of active infection at this time. 09/27/18 on evaluation today patient's wounds on the lower extremity actually appear to be doing fairly well at this time which is good news. There is no evidence of active infection currently and again is just as left lower extremity were there any wounds at this point anyway. I believe they may be completely healed but again I'm not 100% sure based on evaluation today. I think one more week of observation would likely be a good idea. 10/04/18 on evaluation today patient actually appears to be doing excellent in regard to the left lower extremity which actually appears to be completely healed as of today. Unfortunately he's been having issues with his right lower extremity have a new wound that has opened. Fortunately there's no evidence of active infection at this time which is good news. 10/10/18 upon evaluation today patient actually  appears to be doing a little bit worse with the new open area on his right posterior lower extremity. He's been tolerating the dressing changes without complication. Right now we been using his compression wraps although I think we may need to switch back to actually performing bilateral compression wraps are in the clinic. No fevers, chills, nausea, or vomiting noted at this time. 10/17/18 on evaluation today patient actually appears to be doing quite well in regard to his bilateral lower extremity ulcers. He has been tolerating the reps without complication although he would prefer not to rewrap his legs as of today. Fortunately there's no signs of active infection which is good news. No fevers, chills, nausea, or vomiting noted at this time. 10/24/18 on evaluation today patient appears to be doing very well in regard to his lower extremities. His right lower extremity is shown signs of healing and his left lower Trinity though not healed appears to be improving which is excellent news. Overall very pleased with how things seem to be progressing at this time. The patient is likewise happy to hear this. His Velcro compression wraps however have not been put on properly will gonna show his wife how to do that properly today. 10/31/18 on evaluation today patient appears to be doing more poorly in regard to his left lower extremity in particular although both lower extremities actually are showing some signs of being worse than my previous evaluation. Unfortunately I'm just not sure that his compression stockings even with the use of the compression/lymphedema pumps seem to be controlling this well. Upon further questioning he tells me that he also is not able to lie flat in the bed due to his congestive heart failure and difficulty breathing. For that  reason he sleeps in his recliner. To make matters worse his recliner also cannot even hold his legs up so instead of even being somewhat elevated they  pretty much hang down to the floor. This is the way he sleeps each night which is definitely counterproductive to everything else that were attempting to do from the standpoint of controlling his fluid. Nonetheless I think that he potentially could benefit from a hospital bed although this would be something that his primary care provider would likely have to order since anything that is order on our side has to be directly related to wound care and again the hospital bed is not necessarily a direct relation to although I think it does contribute to his overall wound status on his lower extremities. 11/07/18 on evaluation today patient appears to be doing better in regard to his bilateral lower extremity. He's been tolerating the dressing changes without complication. We did in the interim since I last saw him switch to just using extras orbiting new alginate and that seems to have done much better for him. I'm very pleased with the overall progress that is made. 11/14/18 on evaluation today patient appears to be redoing rather well in regard to his left lower extremity ulcers which are the only ones remaining at this point. Fortunately there's no signs of active infection at this time which is good news. Overall been very pleased with how things seem to be progressing currently. No fevers, chills, nausea, or vomiting noted at this time. 11/20/17 on evaluation today patient actually appears to be doing quite well in regard to his lower extremities on the left on the right he has several blisters that showed up although there's some question about whether or not he has had his broker compression wraps on like he was supposed to or not. Fortunately there's no signs of active infection at this time which is good news. Unfortunately though he is doing well from the standpoint of the left leg the right leg is not doing as well again this JANATHAN, BRIBIESCA. ([id number]) is when we did not wrap last week. 11/28/18  on evaluation today patient appears to be doing well in regard to his bilateral lower extremities is left appears to be healed is right is not healed but is very close to being so. Overall very pleased with how things seem to be progressing. Patient is likewise happy that things are doing well. 12/09/18 on evaluation today patient actually appears to be completely healed which is excellent news. He actually seems to be doing well in regard to the swelling of the bilateral lower extremities which is also great news. Overall very pleased with how things seem to be progressing. Readmission: 03/18/2019 upon evaluation today patient presents for reevaluation concerning issues that he is having with his left lower extremity where he does have a wound noted upon inspection today. He also has a wound on the right second toe on the tip where it is apparently been rubbing in his shoe I did look at issues and you can actually see where this has been occurring as well. Fortunately there is no signs of infection with regard to the toe necessarily although it is very swollen compared to normal that does have me somewhat concerned about the possibility of further evaluation for infection/osteomyelitis. I would recommend an x-ray to start with. Otherwise he states that it is been 1-2 weeks that he has had the draining in regard to left lower extremity he does not  know how this happened he has been wearing his compression appropriately and I do see that as well today but nonetheless I do think that he needs to continue to be very cautious with regard to elevation as well. 03/25/2019 on evaluation today patient appears to be doing much better in regard to his lower extremity at this time. That is on the left. He did culture positive for Pseudomonas but the good news is he seems to be doing much better the gentamicin we applied topically seems to have done a great job for him. There is no signs of active infection at  this time systemically and locally he is doing much better. No fevers, chills, nausea, vomiting, or diarrhea. In regard to his right toe this seems to be doing much better there is very little pressure noted at this point I did clean up some of the callus today around the edges of the wound as well as the surface of the wound but to be honest he is progressing quite nicely. 10/30; the area on the left anterior lower tibia area is healed over. His edema control is adequate even though his use of the compression pumps seems very intermittent. He has a juxta lite stocking on the right leg and a think there is 1 for the left leg at home. His wife is putting these on. He has an area on the plantar right second toe which is a hammertoe they are trying to offload this 04/08/2019 on evaluation today patient appears to be doing well in regard to his lower extremities which is good news. We are seeing him today for his toe ulcer which is giving him a little bit more trouble but again still seems to be doing better at this point which is good news. He is going require some sharp debridement in regard to the toe today however. 04/15/2019 on evaluation today patient actually appears to be doing quite well with regard to his toe ulcer. I am very pleased with how things are doing in that regard. With regard to his lower extremity edema in general he tells me he is not been using the lymphedema pumps regularly although he does not use them. I think that he needs to use them more regularly he does have a small blister on the left lower extremity this is not open at this point but obviously it something that can get worse if he does not keep the compression going and the pumps going as well. Electronic Signature(s) Signed: 04/15/2019 9:53:07 AM By: Worthy Keeler PA-C Entered By: Worthy Keeler on 04/15/2019 09:53:07 Adam Merritt  ([id number]) -------------------------------------------------------------------------------- Physical Exam Details Patient Name: Adam Merritt Date of Service: 04/15/2019 9:30 AM Medical Record Number: [id number] Patient Account Number: [id number] Date of Birth/Sex: 10-Sep-1945 (73 y.o. M) Treating RN: Montey Hora Primary Care Provider: [name] Other Clinician: Referring Provider: [name] Treating Provider/Extender: STONE III, [name]: 4 Constitutional Well-nourished and well-hydrated in no acute distress. Respiratory normal breathing without difficulty. clear to auscultation bilaterally. Cardiovascular regular rate and rhythm with normal S1, S2. Psychiatric this patient is able to make decisions and demonstrates good insight into disease process. Alert and Oriented x 3. pleasant and cooperative. Notes Patient's wound bed currently on the toe actually appears to be doing quite well at this point and there does not appear to be any signs of active infection at this time. Fortunately there is no evidence of systemic infection either. Overall I think his  toe is healing quite nicely. Electronic Signature(s) Signed: 04/15/2019 9:53:39 AM By: Worthy Keeler PA-C Entered By: Worthy Keeler on 04/15/2019 09:53:39 Adam Merritt ([id number]) -------------------------------------------------------------------------------- Physician Orders Details Patient Name: Adam Merritt Date of Service: 04/15/2019 9:30 AM Medical Record Number: [id number] Patient Account Number: [id number] Date of Birth/Sex: [birth year] (73 y.o. 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 Primary Wound Dressing Wound #24 Right,Distal Toe Second o Silver Collagen Secondary Dressing Wound #24 Right,Distal Toe Second o Foam - with coverlet or bandaid Dressing Change Frequency Wound #24 Right,Distal Toe Second o Change dressing every other day. Follow-up Appointments Wound #24 Right,Distal Toe Second o Return Appointment in 1 week. Edema Control o Patient to wear own Velcro compression garment. o Elevate legs to the level of the heart and pump ankles as often as possible o Compression Pump: Use compression pump on left lower extremity for 60 minutes, twice daily. Electronic Signature(s) Signed: 04/15/2019 5:03:01 PM By: Montey Hora Signed: 04/15/2019 5:47:19 PM By: Worthy Keeler PA-C Entered By: Montey Hora on 04/15/2019 09:40:16 Adam Merritt ([id number]) -------------------------------------------------------------------------------- Problem List Details Patient Name: Adam Merritt Date of Service: 04/15/2019 9:30 AM Medical Record Number: [id number] Patient Account Number: [id number] Date of Birth/Sex: 25-Jun-1945 (73 y.o. M) Treating RN: Montey Hora Primary Care Provider: [name] Other Clinician: Referring Provider: [name] Treating Provider/Extender: Melburn Hake, [name]: 4 Active Problems ICD-10 Evaluated Encounter Code Description Active Date Today Diagnosis I89.0 Lymphedema, not elsewhere classified 03/18/2019 No Yes E11.621 Type 2 diabetes mellitus with foot ulcer 03/18/2019 No  Yes L97.512 Non-pressure chronic ulcer of other part of right foot with fat 03/18/2019 No Yes layer exposed L97.822 Non-pressure chronic ulcer of other part of left lower leg with 03/18/2019 No Yes fat layer exposed E66.09 Other obesity due to excess calories 03/18/2019 No Yes Inactive Problems Resolved Problems Electronic Signature(s) Signed: 04/15/2019 9:30:59 AM By: Worthy Keeler PA-C Entered By: Worthy Keeler on 04/15/2019 09:30:59 Adam Merritt ([id number]) -------------------------------------------------------------------------------- Progress Note Details Patient Name: Adam Merritt Date of Service: 04/15/2019 9:30 AM Medical Record Number: [id number] Patient Account Number: [id number] Date of Birth/Sex: 04-22-1946 (73 y.o. M) Treating RN: Montey Hora Primary Care Provider: [name] Other Clinician: Referring Provider: [name] Treating Provider/Extender: Melburn Hake, [name]: 4 Subjective Chief Complaint Information obtained from Patient Right toe and left LE Ulcer History of Present Illness (HPI) 10/18/17-He is here for initial evaluation of bilateral lower extremity ulcerations in the presence of venous insufficiency and lymphedema. He has been seen by vascular medicine in the past, Dr. Lucky Cowboy, last seen in 2016. He does have a history of abnormal ABIs, which is to be expected given his lymphedema and venous insufficiency. According to Epic, it appears that all attempts for arterial evaluation and/or angiography were not follow through with by  patient. He does have a history of being seen in lymphedema clinic in 2018, stopped going approximately 6 months ago stating "it didn't do any good". He does not have lymphedema pumps, he does not have custom fit compression wrap/stockings. He is diabetic and his recent A1c last month was 7.6. He admits to chronic bilateral lower extremity pain, no change in pain since blister and ulceration development. He  is currently being treated with Levaquin for bronchitis. He has home health and we will continue. 10/25/17-He is here in follow-up evaluation for bilateral lower extremity ulcerationssubtle he remains on Levaquin for bronchitis. Right lower extremity with no evidence of drainage or ulceration, persistent left lower extremity ulceration. He states that home health has not been out since his appointment. He went to [facility] on Tuesday, studies revealed: RIGHT ABI 0.9, TBI 0.6 LEFT ABI 1.1, TBI 0.6 with triphasic flow bilaterally. We will continue his same treatment plan. He has been educated on compression therapy and need for elevation. He will benefit from lymphedema pumps 11/01/17-He is here in follow-up evaluation for left lower extremity ulcer. The right lower extremity remains healed. He has home health services, but they have not been out to see the patient for 2-3 weeks. He states it home health physical therapy changed his dressing yesterday after therapy; he placed Ace wrap compression. We are still waiting for lymphedema pumps, reordered d/t need for company change. 11/08/17-He is here in follow-up evaluation for left lower extremity ulcer. It is improved. Edema is significantly improved with compression therapy. We will continue with same treatment plan and he will follow-up next week. No word regarding lymphedema pumps 11/15/17-He is here in follow-up evaluation for left lower extremity ulcer. He is healed and will be discharged from wound care services. I have reached out to [facility] regarding his lymphedema pumps. They have been unable to reach the patient; the contact number they had with the patient's wife's cell phone and she has not answered any unrecognized calls. Contact should be made today, trial planned for next week; [facility] will continue to follow 11/27/17 on evaluation today patient has multiple blistered areas over the right lower extremity  his left lower extremity appears to be doing okay. These blistered areas show signs of no infection which is great news. With that being said he did have some necrotic skin overlying which was mechanically debrided away with saline and gauze today without complication. Overall post debridement the wounds appear to be doing better but in general his swelling seems to be increased. This is obviously not good news. I think this is what has given rise to the blisters. 12/04/17 on evaluation today patient presents for follow-up concerning his bilateral lower extremity edema in the right lower extremity ulcers. He has been tolerating the dressing changes without complication. With that being said he has had no real issues with the wraps which is also good news. Overall I'm pleased with the progress he's been making. 12/11/17 on evaluation today patient appears to be doing rather well in regard to his right lateral lower extremity ulcer. He's been tolerating the dressing changes without complication. Fortunately there does not appear to be any evidence of infection at this time. Overall I'm pleased with the progress that is being made. Unfortunately he has been in the hospital due to having what sounds to be a stomach virus/flu fortunately that is starting to get better. 12/18/17 on evaluation today patient actually appears to be doing very well in  regard to his bilateral lower extremities the LISA, BLAKEMAN. ([id number]) swelling is under fairly good control his lymphedema pumps are still not up and running quite yet. With that being said he does have several areas of opening noted as far as wounds are concerned mainly over the left lower extremity. With that being said I do believe once he gets lymphedema pumps this would least help you mention some the fluid and preventing this from occurring. Hopefully that will be set up soon sleeves are Artie in place at his home he just waiting for the machine. 12/25/17  on evaluation today patient actually appears to be doing excellent in fact all of his ulcers appear to have resolved his legs appear very well. I do think he needs compression stockings we have discussed this and they are actually going to go to [place] today to elastic therapy to get this fitted for him. I think that is definitely a good thing to do. Readmission: 04/09/18 upon evaluation today this patient is seen for readmission due to bilateral lower extremity lymphedema. He has significant swelling of his extremities especially on the left although the right is also swollen he has weeping from both sides. There are no obvious open wounds at this point. Fortunately he has been doing fairly well for quite a bit of time since I last saw him. Nonetheless unfortunately this seems to have reopened and is giving quite a bit of trouble. He states this began about a week ago when he first called [place] to get in to be seen. No fevers, chills, nausea, or vomiting noted at this time. He has not been using his lymphedema pumps due to the fact that they won't fit on his leg at this point likewise is also not been using his compression for essentially the same reason. 04/16/18 upon evaluation today patient actually appears to be doing a little better in regard to the fluid in his bilateral lower extremities. With that being said he's had three falls since I saw him last week. He also states that he's been feeling very poorly. I was concerned last week and feel like that the concern is still there as far as the congestion in his chest is concerned he seems to be breathing about the same as last week but again he states he's very weak he's not even able to walk further than from the chair to the door. His wife had to buy a wheelchair just to be able to get them out of the house to get to the appointment today. This has me very concerned. 04/23/18 on evaluation today patient actually appears to be doing much better  than last week's evaluation. At that time actually had to transport him to the ER via EMS and he subsequently was admitted for acute pulmonary edema, acute renal failure, and acute congestive heart failure. Fortunately he is doing much better. Apparently they did dialyze him and were able to take off roughly 35 pounds of fluid. Nonetheless he is feeling much better both in regard to his breathing and he's able to get around much better at this time compared to previous. Overall I'm very happy with how things are at this time. There does not appear to be any evidence of infection currently. No fevers, chills, nausea, or vomiting noted at this time. 04/30/2018 patient seen today for follow-up and management of bilateral lower extremity lymphedema. He did express being more sad today than usual due to the recent loss of his dog.  He states that he has been compliant with using the lymphedema pumps. However he does admit a minute over the last 2-3 days he has not been using the pumps due to the recent loss of his dog. At this time there is no drainage or open wounds to his lower extremities. The left leg edema is measuring smaller today. Still has a significant amount of edema on bilateral lower extremities With dry flaky skin. He will be referred to the lymphedema clinic for further management. Will continue 3 layer compression wraps and follow-up in 1-2 weeks.Denies any pain, fever, chairs recently. No recent falls or injuries reported during this visit. 05/07/18 on evaluation today patient actually appears to be doing very well in regard to his lower extremities in general all things considered. With that being said he is having some pain in the legs just due to the amount of swelling. He does have an area where he had a blister on the left lateral lower extremity this is open at this point other than that there's nothing else weeping at this time. 05/14/18 on evaluation today patient actually appears  to be doing excellent all things considered in regard to his lower extremities. He still has a couple areas of weeping on each leg which has continued to be the issue for him. He does have an appointment with the lymphedema clinic although this isn't until February 2020. That was the earliest they had. In the meantime he has continued to tolerate the compression wraps without complication. 05/28/18 on evaluation today patient actually appears to be doing more poorly in regard to his left lower extremity where he has a wound open at this point. He also had a fall where he subsequently injured his right great toe which has led to an open wound at the site unfortunately. He has been tolerating the dressing changes without complication in general as far as the wraps are concerned that he has not been putting any dressing on the left 1st toe ulcer site. 06/11/18 on evaluation today patient appears to be doing much worse in regard to his bilateral lower extremity ulcers. He has been tolerating the dressing changes without complication although his legs have not been wrapped more recently. Overall I am not very pleased with the way his legs appear. I do believe he needs to be back in compression wraps he still has not received his compression wraps from the [facility] hospital as of yet. ESTEN, DOLLAR ([id number]) 06/18/18 on evaluation today patient actually appears to be doing significantly better than last time I saw him. He has been tolerating the compression wraps without complication in the circumferential ulcers especially appear to be doing much better. His toe ulcer on the right in regard to the great toe is better although not as good as the legs in my pinion. No fevers chills noted 07/02/18 on evaluation today patient appears to be doing much better in regard to his lower extremity ulcers. Unfortunately since I last saw him he's had the distal portion of his right great toe if he dated it sounds as if  this actually went downhill very quickly. I had only seen him a few days prior and the toe did not appear to be infected at that point subsequently became infected very rapidly and it was decided by the surgeon that the distal portion of the toe needed to be removed. The patient seems to be doing well in this regard he tells me. With that being said his  lower extremities are doing better from the standpoint of the wounds although he is significantly swollen at this point. 07/09/18 on evaluation today patient appears to be doing better in regard to the wounds on his lower extremities. In fact everything is almost completely healed he is just a small area on the left posterior lower extremity that is open at this point. 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 With that being said he needs some kind of compression it does sound like he may not be wearing his compression, that is the wraps, during the entire time between when he's here visit to visit. Apparently his wife took the current one off because it began to "fall apart". 07/16/18 on evaluation today patient appears to be extremely swollen especially in regard to his left lower extremity unfortunately. He also has a new skin tear over the left lower extremity and there's a smaller area on the right lower extremity as well. Unfortunately this seems to be due in part to blistering and fluid buildup in his leg. He did get the reduction wraps that were ordered by the [facility] hospital for him to go to lymphedema clinic. With that being said his wounds on the legs have not healed to the point to where they would likely accept them as a patient lymphedema clinic currently. We need to try to get this to heal. With that being said he's been taking his wraps off which is not doing  him any favors at this point. In fact this is probably quite counterproductive compared to what needs to occur. We will likely need to increase to a four layer compression wrap and continue to also utilize elevation and he has to keep the wraps on not take them off as he's been doing currently hasn't had a wrap on since Saturday. 07/23/18 on evaluation today patient appears to be doing much better in regard to his bilateral lower extremities. In fact his left lower extremity which was the largest is actually 15 cm smaller today compared to what it was last time he was here in our clinic. This is obviously good news after just one week. Nonetheless the differences he actually kept the wraps on during the entire week this time. That's not typical for him. I do believe he understands a little bit better now the severity of the situation and why it's important for him to keep these wraps on. 07/30/18 on evaluation today patient actually appears to be doing rather well in regard to his lower extremities. His legs are much smaller than they have been in the past and he actually has only one very small rudder superficial region remaining that is not closed on the left lateral/posterior lower extremity even this is almost completely close. I do believe likely next week he will be healed without any complications. I do think we need to continue the wraps however this seems to be beneficial for him. I also think it may be a good time for [place] to go ahead and see about getting the appointment with the lymphedema clinic which is supposed to be made for him in order to keep this moving along and hopefully get them into compression wraps that will in the end help him to remain healed. 08/06/18 on evaluation today patient actually appears to be doing very well  in regard as bilateral lower extremities. In fact his wounds appear to be completely healed at this time. He does have bilateral lymphedema which has been  extremely well controlled with the compression wraps. He is in the process of getting appointment with the lymphedema clinic we have made this referral were just waiting to hear back on the schedule time. We need to follow up on that today as well. 08/13/18 on evaluation today patient actually appears to be doing very well in regard to his bilateral lower extremities there are no open wounds at this point. We are gonna go ahead and see about ordering the Velcro compression wraps for him had a discussion with them about [place] doing it versus the [place] they feel like they can definitely afford going ahead and get the wraps themselves and they would prefer to try to avoid having to go to the lymphedema clinic if it all possible which I completely understand. As long as he has good compression I'm okay either way. 3/17//20 on evaluation today patient actually appears to be doing well in regard to his bilateral lower extremity ulcers. He has been tolerating the dressing changes without complication specifically the compression wraps. Overall is had no issues my fingers finding that I see at this point is that he is having trouble with constipation. He tells me he has not been able to go to the bathroom for about six days. He's taken to over-the-counter oral laxatives unfortunately this is not helping. He has not contacted his doctor. UCHENNA, RAPPAPORT ([id number]) 08/27/18 on evaluation today patient appears to be doing fairly well in regard to his lower extremities at this point. There does not appear to be any new altars is swelling is very well controlled. We are still waiting for his Velcro compression wraps to arrive that should be sometime in the next week is Artie sent the check for these. 09/03/18 on evaluation today patient appears to be doing excellent in regard to his bilateral lower extremities which he shows no signs of wound openings in regard to this point. He does have his Velcro compression wraps  which did arrive in the mail since I saw him last week. Overall he is doing excellent in my pinion. 09/13/18 on evaluation today patient appears to be doing very well currently in regard to the overall appearance of his bilateral lower extremities although he's a little bit more swollen than last time we saw him. At that point he been discharged without any open wounds. Nonetheless he has a small open wound on the posterior left lower extremity with some evidence of cellulitis noted as well. Fortunately I feel like he has made good progress overall with regard to his lower extremities from were things used to be. 09/20/18 on evaluation today patient actually appears to be doing much better. The erythematous lower extremity is improving wound itself which is still open appears to be doing much better as far as but appearance as well as pain is concerned overall very pleased in this regard. There's no signs of active infection at this time. 09/27/18 on evaluation today patient's wounds on the lower extremity actually appear to be doing fairly well at this time which is good news. There is no evidence of active infection currently and again is just as left lower extremity were there any wounds at this point anyway. I believe they may be completely healed but again I'm not 100% sure based on evaluation today. I think one more week of  observation would likely be a good idea. 10/04/18 on evaluation today patient actually appears to be doing excellent in regard to the left lower extremity which actually appears to be completely healed as of today. Unfortunately he's been having issues with his right lower extremity have a new wound that has opened. Fortunately there's no evidence of active infection at this time which is good news. 10/10/18 upon evaluation today patient actually appears to be doing a little bit worse with the new open area on his right posterior lower extremity. He's been tolerating the dressing  changes without complication. Right now we been using his compression wraps although I think we may need to switch back to actually performing bilateral compression wraps are in the clinic. No fevers, chills, nausea, or vomiting noted at this time. 10/17/18 on evaluation today patient actually appears to be doing quite well in regard to his bilateral lower extremity ulcers. He has been tolerating the reps without complication although he would prefer not to rewrap his legs as of today. Fortunately there's no signs of active infection which is good news. No fevers, chills, nausea, or vomiting noted at this time. 10/24/18 on evaluation today patient appears to be doing very well in regard to his lower extremities. His right lower extremity is shown signs of healing and his left lower Trinity though not healed appears to be improving which is excellent news. Overall very pleased with how things seem to be progressing at this time. The patient is likewise happy to hear this. His Velcro compression wraps however have not been put on properly will gonna show his wife how to do that properly today. 10/31/18 on evaluation today patient appears to be doing more poorly in regard to his left lower extremity in particular although both lower extremities actually are showing some signs of being worse than my previous evaluation. Unfortunately I'm just not sure that his compression stockings even with the use of the compression/lymphedema pumps seem to be controlling this well. Upon further questioning he tells me that he also is not able to lie flat in the bed due to his congestive heart failure and difficulty breathing. For that reason he sleeps in his recliner. To make matters worse his recliner also cannot even hold his legs up so instead of even being somewhat elevated they pretty much hang down to the floor. This is the way he sleeps each night which is definitely counterproductive to everything else that were  attempting to do from the standpoint of controlling his fluid. Nonetheless I think that he potentially could benefit from a hospital bed although this would be something that his primary care provider would likely have to order since anything that is order on our side has to be directly related to wound care and again the hospital bed is not necessarily a direct relation to although I think it does contribute to his overall wound status on his lower extremities. 11/07/18 on evaluation today patient appears to be doing better in regard to his bilateral lower extremity. He's been tolerating the dressing changes without complication. We did in the interim since I last saw him switch to just using extras orbiting new alginate and that seems to have done much better for him. I'm very pleased with the overall progress that is made. 11/14/18 on evaluation today patient appears to be redoing rather well in regard to his left lower extremity ulcers which are the only ones remaining at this point. Fortunately there's no signs of active  infection at this time which is good news. Overall been JONNATAN, HANNERS ([id number]) very pleased with how things seem to be progressing currently. No fevers, chills, nausea, or vomiting noted at this time. 11/20/17 on evaluation today patient actually appears to be doing quite well in regard to his lower extremities on the left on the right he has several blisters that showed up although there's some question about whether or not he has had his broker compression wraps on like he was supposed to or not. Fortunately there's no signs of active infection at this time which is good news. Unfortunately though he is doing well from the standpoint of the left leg the right leg is not doing as well again this is when we did not wrap last week. 11/28/18 on evaluation today patient appears to be doing well in regard to his bilateral lower extremities is left appears to be healed is right is  not healed but is very close to being so. Overall very pleased with how things seem to be progressing. Patient is likewise happy that things are doing well. 12/09/18 on evaluation today patient actually appears to be completely healed which is excellent news. He actually seems to be doing well in regard to the swelling of the bilateral lower extremities which is also great news. Overall very pleased with how things seem to be progressing. Readmission: 03/18/2019 upon evaluation today patient presents for reevaluation concerning issues that he is having with his left lower extremity where he does have a wound noted upon inspection today. He also has a wound on the right second toe on the tip where it is apparently been rubbing in his shoe I did look at issues and you can actually see where this has been occurring as well. Fortunately there is no signs of infection with regard to the toe necessarily although it is very swollen compared to normal that does have me somewhat concerned about the possibility of further evaluation for infection/osteomyelitis. I would recommend an x-ray to start with. Otherwise he states that it is been 1-2 weeks that he has had the draining in regard to left lower extremity he does not know how this happened he has been wearing his compression appropriately and I do see that as well today but nonetheless I do think that he needs to continue to be very cautious with regard to elevation as well. 03/25/2019 on evaluation today patient appears to be doing much better in regard to his lower extremity at this time. That is on the left. He did culture positive for Pseudomonas but the good news is he seems to be doing much better the gentamicin we applied topically seems to have done a great job for him. There is no signs of active infection at this time systemically and locally he is doing much better. No fevers, chills, nausea, vomiting, or diarrhea. In regard to his right toe this  seems to be doing much better there is very little pressure noted at this point I did clean up some of the callus today around the edges of the wound as well as the surface of the wound but to be honest he is progressing quite nicely. 10/30; the area on the left anterior lower tibia area is healed over. His edema control is adequate even though his use of the compression pumps seems very intermittent. He has a juxta lite stocking on the right leg and a think there is 1 for the left leg at home. His wife  is putting these on. He has an area on the plantar right second toe which is a hammertoe they are trying to offload this 04/08/2019 on evaluation today patient appears to be doing well in regard to his lower extremities which is good news. We are seeing him today for his toe ulcer which is giving him a little bit more trouble but again still seems to be doing better at this point which is good news. He is going require some sharp debridement in regard to the toe today however. 04/15/2019 on evaluation today patient actually appears to be doing quite well with regard to his toe ulcer. I am very pleased with how things are doing in that regard. With regard to his lower extremity edema in general he tells me he is not been using the lymphedema pumps regularly although he does not use them. I think that he needs to use them more regularly he does have a small blister on the left lower extremity this is not open at this point but obviously it something that can get worse if he does not keep the compression going and the pumps going as well. Patient History Information obtained from Patient. Family History Cancer - Paternal Grandparents, Kidney Disease - Siblings, Lung Disease - Mother, No family history of Diabetes, Heart Disease, Hereditary Spherocytosis, Hypertension, Seizures, Stroke, Thyroid Problems, Tuberculosis. Social History Never smoker, Marital Status - Married, Alcohol Use - Never, Drug  Use - No History, Caffeine Use - Never. JIMEL, MYLER ([id number]) Medical History Eyes Denies history of Cataracts, Glaucoma, Optic Neuritis Ear/Nose/Mouth/Throat Denies history of Chronic sinus problems/congestion, Middle ear problems Hematologic/Lymphatic Patient has history of Anemia - aplastic anemia, Lymphedema Denies history of Hemophilia, Human Immunodeficiency Virus, Sickle Cell Disease Respiratory Patient has history of Chronic Obstructive Pulmonary Disease (COPD), Sleep Apnea Denies history of Aspiration, Asthma, Pneumothorax, Tuberculosis Cardiovascular Patient has history of Congestive Heart Failure, Coronary Artery Disease, Hypertension, Peripheral Venous Disease Denies history of Angina, Arrhythmia, Deep Vein Thrombosis, Hypotension, Myocardial Infarction, Peripheral Arterial Disease, Phlebitis, Vasculitis Gastrointestinal Denies history of Cirrhosis , Colitis, Crohn s, Hepatitis A, Hepatitis B, Hepatitis C Endocrine Patient has history of Type II Diabetes Denies history of Type I Diabetes Genitourinary Denies history of End Stage Renal Disease Immunological Denies history of Lupus Erythematosus, Raynaud s, Scleroderma Integumentary (Skin) Denies history of History of Burn, History of pressure wounds Musculoskeletal Patient has history of Osteoarthritis Denies history of Gout, Rheumatoid Arthritis, Osteomyelitis Neurologic Patient has history of Neuropathy Denies history of Dementia, Quadriplegia, Paraplegia, Seizure Disorder Oncologic Patient has history of Received Chemotherapy - last dose 2006 Denies history of Received Radiation Psychiatric Denies history of Anorexia/bulimia, Confinement Anxiety Hospitalization/Surgery History - bronchitis. Medical And Surgical History Notes Respiratory home O2 at night as needed Cardiovascular varicose veins Neurologic CVA - 1992 Review of Systems (ROS) Constitutional Symptoms (General Health) Denies complaints or  symptoms of Fatigue, Fever, Chills, Marked Weight Change. Respiratory Denies complaints or symptoms of Chronic or frequent coughs, Shortness of Breath. Cardiovascular Denies complaints or symptoms of Chest pain, LE edema. Psychiatric Denies complaints or symptoms of Anxiety, Claustrophobia. VALOR, QUAINTANCE ([id number]) Objective Constitutional Well-nourished and well-hydrated in no acute distress. Vitals Time Taken: 9:26 AM, Height: 66 in, Weight: 297 lbs, BMI: 47.9, Temperature: 99.0 F, Pulse: 74 bpm, Respiratory Rate: 16 breaths/min, Blood Pressure: 166/72 mmHg. Respiratory normal breathing without difficulty. clear to auscultation bilaterally. Cardiovascular regular rate and rhythm with normal S1, S2. Psychiatric this patient is able to make decisions and demonstrates good  insight into disease process. Alert and Oriented x 3. pleasant and cooperative. General Notes: Patient's wound bed currently on the toe actually appears to be doing quite well at this point and there does not appear to be any signs of active infection at this time. Fortunately there is no evidence of systemic infection either. Overall I think his toe is healing quite nicely. Integumentary (Hair, Skin) Wound #24 status is Open. Original cause of wound was Gradually Appeared. The wound is located on the Right,Distal Toe Second. The wound measures 0.3cm length x 0.3cm width x 0.1cm depth; 0.071cm^2 area and 0.007cm^3 volume. There is Fat Layer (Subcutaneous Tissue) Exposed exposed. There is no tunneling or undermining noted. There is a medium amount of serous drainage noted. The wound margin is flat and intact. There is large (67-100%) red granulation within the wound bed. There is a small (1-33%) amount of necrotic tissue within the wound bed including Adherent Slough. 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 Primary Wound Dressing: Wound #24 Right,Distal Toe Second: Silver Collagen Secondary Dressing: Wound #24 Right,Distal Toe Second: Foam - with coverlet or bandaid Dressing Change Frequency: Wound #24 Right,Distal Toe Second: Change dressing every other day. Follow-up Appointments: Wound #24 Right,Distal Toe Second: Return Appointment in 1 week. Edema Control: Patient to wear own Velcro compression garment. Elevate legs to the level of the heart and pump ankles as often as possible Compression Pump: Use compression pump on left lower extremity for 60 minutes, twice daily. 1. I would recommend at this time that we switch to a silver collagen dressing I think this may be a better option for him currently. 2. I would also recommend that we go ahead and have him continue with his Velcro compression wraps I feel like they are doing a good job for him as well. 3. Also recommend that we go ahead and have him use the lymphedema pumps 2 times a day for 60 minutes each time and he needs to do this daily or as he put it "religiously". We will see patient back for reevaluation in 1 week here in the clinic. If anything worsens or changes patient will contact our office for additional recommendations. Electronic Signature(s) Signed: 04/15/2019 9:54:20 AM By: Worthy Keeler PA-C Entered By: Worthy Keeler on 04/15/2019 09:54:20 Adam Merritt ([id number]) -------------------------------------------------------------------------------- ROS/PFSH Details Patient Name: Adam Merritt Date of Service:  04/15/2019 9:30 AM Medical Record Number: [id number] Patient Account Number: [id number] Date of Birth/Sex: November 26, 1945 (73 y.o. M) Treating RN: Montey Hora Primary Care Provider: [name] Other Clinician: Referring Provider: [name] Treating Provider/Extender: Melburn Hake, [name]: 4 Information Obtained From Patient Constitutional Symptoms (General Health) Complaints and Symptoms: Negative for: Fatigue; Fever; Chills; Marked Weight Change Respiratory Complaints and Symptoms: Negative for: Chronic or frequent coughs; Shortness of Breath Medical History: Positive for: Chronic Obstructive Pulmonary Disease (COPD); Sleep Apnea Negative for: Aspiration; Asthma; Pneumothorax; Tuberculosis Past Medical History Notes: home O2 at night as needed Cardiovascular Complaints and Symptoms: Negative for: Chest pain; LE edema Medical History:  Positive for: Congestive Heart Failure; Coronary Artery Disease; Hypertension; Peripheral Venous Disease Negative for: Angina; Arrhythmia; Deep Vein Thrombosis; Hypotension; Myocardial Infarction; Peripheral Arterial Disease; Phlebitis; Vasculitis Past Medical History Notes: varicose veins Psychiatric Complaints and Symptoms: Negative for: Anxiety; Claustrophobia Medical History: Negative for: Anorexia/bulimia; Confinement Anxiety Eyes Medical History: Negative for: Cataracts; Glaucoma; Optic Neuritis Ear/Nose/Mouth/Throat Medical History: Negative for: Chronic sinus problems/congestion; Middle ear problems Hematologic/Lymphatic DEMBA, NIGH ([id number]) Medical History: Positive for: Anemia - aplastic anemia; Lymphedema Negative for: Hemophilia; Human Immunodeficiency Virus; Sickle Cell Disease Gastrointestinal Medical History: Negative for: Cirrhosis ; Colitis; Crohnos; Hepatitis A; Hepatitis B; Hepatitis C Endocrine Medical History: Positive for: Type II Diabetes Negative for: Type I Diabetes Time with diabetes:  since 2006 Treated with: Insulin Blood sugar tested every day: Yes Tested : Blood sugar testing results: Breakfast: 209 Genitourinary Medical History: Negative for: End Stage Renal Disease Immunological Medical History: Negative for: Lupus Erythematosus; Raynaudos; Scleroderma Integumentary (Skin) Medical History: Negative for: History of Burn; History of pressure wounds Musculoskeletal Medical History: Positive for: Osteoarthritis Negative for: Gout; Rheumatoid Arthritis; Osteomyelitis Neurologic Medical History: Positive for: Neuropathy Negative for: Dementia; Quadriplegia; Paraplegia; Seizure Disorder Past Medical History Notes: CVA - 1992 Oncologic Medical History: Positive for: Received Chemotherapy - last dose 2006 Negative for: Received Radiation Immunizations Pneumococcal Vaccine: Received Pneumococcal Vaccination: Yes ACESON, LABELL ([id number]) Implantable Devices No devices added Hospitalization / Surgery History Type of Hospitalization/Surgery bronchitis Family and Social History Cancer: Yes - Paternal Grandparents; Diabetes: No; Heart Disease: No; Hereditary Spherocytosis: No; Hypertension: No; Kidney Disease: Yes - Siblings; Lung Disease: Yes - Mother; Seizures: No; Stroke: No; Thyroid Problems: No; Tuberculosis: No; Never smoker; Marital Status - Married; Alcohol Use: Never; Drug Use: No History; Caffeine Use: Never; Financial Concerns: No; Food, Clothing or Shelter Needs: No; Support System Lacking: No; Transportation Concerns: No Physician Affirmation I have reviewed and agree with the above information. Electronic Signature(s) Signed: 04/15/2019 5:03:01 PM By: Montey Hora Signed: 04/15/2019 5:47:19 PM By: Worthy Keeler PA-C Entered By: Worthy Keeler on 04/15/2019 09:53:23 CURTIS, CAIN ([id number]) -------------------------------------------------------------------------------- SuperBill Details Patient Name: Adam Merritt Date of  Service: 04/15/2019 Medical Record Number: [id number] Patient Account Number: [id number] Date of Birth/Sex: May 11, 1946 (73 y.o. M) Treating RN: Montey Hora Primary Care Provider: [name] Other Clinician: Referring Provider: [name] Treating Provider/Extender: Melburn Hake, [name]: 4 Diagnosis Coding ICD-10 Codes Code Description I89.0 Lymphedema, not elsewhere classified E11.621 Type 2 diabetes mellitus with foot ulcer L97.512 Non-pressure chronic ulcer of other part of right foot with fat layer exposed L97.822 Non-pressure chronic ulcer of other part of left lower leg with fat layer exposed E66.09 Other obesity due to excess calories Facility Procedures CPT4 Code: 62952841 Description: 99213 - WOUND CARE VISIT-LEV 3 EST PT Modifier: Quantity: 1 Physician Procedures CPT4 Code Description: 3244010 99214 - WC PHYS LEVEL 4 - EST PT ICD-10 Diagnosis Description I89.0 Lymphedema, not elsewhere classified E11.621 Type 2 diabetes mellitus with foot ulcer L97.512 Non-pressure chronic ulcer of other part of right foot with  fat L97.822 Non-pressure chronic ulcer of other part of left lower leg with Modifier: layer exposed fat layer expos Quantity: 1 ed Electronic Signature(s) Signed: 04/15/2019 9:54:39 AM By: Worthy Keeler PA-C Entered By: Worthy Keeler on 04/15/2019 09:54:38

## 2019-04-17 NOTE — Progress Notes (Signed)
CARLE, FENECH ([id number]) Visit Report for 04/15/2019 Arrival Information Details Patient Name: Adam Merritt, Adam Merritt Date of Service: 04/15/2019 9:30 AM Medical Record Number: [id number] Patient Account Number: [id number] Date of Birth/Sex: Aug 13, 1945 (73 y.o. M) Treating RN: Army Melia Primary Care Cristofher Livecchi: [name] Other Clinician: Referring Devaeh Amadi: [name] Treating Yovan Leeman/Extender: Melburn Hake, [name]: 4 Visit Information History Since Last Visit Added or deleted any medications: No Patient Arrived: Cane Any new allergies or adverse reactions: No Arrival Time: 09:24 Had a fall or experienced change in No Accompanied By: wife activities of daily living that may affect Transfer Assistance: None risk of falls: Patient Identification Verified: Yes Signs or symptoms of abuse/neglect since last visito No Patient Has Alerts: Yes Hospitalized since last visit: No Patient Alerts: Patient on Blood Thinner Has Dressing in Place as Prescribed: Yes DMII Pain Present Now: No aspirin 325 ABI 10/22/17 L 1.15 R .98 TBI L .66 R .61 Electronic Signature(s) Signed: 04/17/2019 3:26:34 PM By: Army Melia Entered By: Army Melia on 04/15/2019 09:26:20 Adam Merritt ([id number]) -------------------------------------------------------------------------------- Clinic Level of Care Assessment Details Patient Name: Adam Merritt Date of Service: 04/15/2019 9:30 AM Medical Record Number: [id number] Patient Account Number: [id number] Date of Birth/Sex: Sep 16, 1945 (73 y.o. M) Treating RN: Montey Hora Primary Care Karmela Bram: [name] Other Clinician: Referring Lief Palmatier: [name] Treating Genavie Boettger/Extender: Melburn Hake, [name]: 4 Clinic Level of Care Assessment Items TOOL 4 Quantity Score []  - Use when only an EandM is performed on FOLLOW-UP visit 0 ASSESSMENTS - Nursing Assessment / Reassessment X - Reassessment of Co-morbidities (includes  updates in patient status) 1 10 X- 1 5 Reassessment of Adherence to Treatment Plan ASSESSMENTS - Wound and Skin Assessment / Reassessment X - Simple Wound Assessment / Reassessment - one wound 1 5 []  - 0 Complex Wound Assessment / Reassessment - multiple wounds []  - 0 Dermatologic / Skin Assessment (not related to wound area) ASSESSMENTS - Focused Assessment []  - Circumferential Edema Measurements - multi extremities 0 []  - 0 Nutritional Assessment / Counseling / Intervention X- 1 5 Lower Extremity Assessment (monofilament, tuning fork, pulses) []  - 0 Peripheral Arterial Disease Assessment (using hand held doppler) ASSESSMENTS - Ostomy and/or Continence Assessment and Care []  - Incontinence Assessment and Management 0 []  - 0 Ostomy Care Assessment and Management (repouching, etc.) PROCESS - Coordination of Care X - Simple Patient / Family Education for ongoing care 1 15 []  - 0 Complex (extensive) Patient / Family Education for ongoing care X- 1 10 Staff obtains Programmer, systems, Records, Test Results / Process Orders []  - 0 Staff telephones HHA, Nursing Homes / Clarify orders / etc []  - 0 Routine Transfer to another Facility (non-emergent condition) []  - 0 Routine Hospital Admission (non-emergent condition) []  - 0 New Admissions / Biomedical engineer / Ordering NPWT, Apligraf, etc. []  - 0 Emergency Hospital Admission (emergent condition) X- 1 10 Simple Discharge Coordination Adam Merritt, Adam Merritt ([id number]) []  - 0 Complex (extensive) Discharge Coordination PROCESS - Special Needs []  - Pediatric / Minor Patient Management 0 []  - 0 Isolation Patient Management []  - 0 Hearing / Language / Visual special needs []  - 0 Assessment of Community assistance (transportation, D/C planning, etc.) []  - 0 Additional assistance / Altered mentation []  - 0 Support Surface(s) Assessment (bed, cushion, seat, etc.) INTERVENTIONS - Wound Cleansing / Measurement X - Simple Wound Cleansing -  one wound 1 5 []  - 0 Complex Wound Cleansing - multiple wounds X- 1 5 Wound Imaging (  photographs - any number of wounds) []  - 0 Wound Tracing (instead of photographs) X- 1 5 Simple Wound Measurement - one wound []  - 0 Complex Wound Measurement - multiple wounds INTERVENTIONS - Wound Dressings X - Small Wound Dressing one or multiple wounds 1 10 []  - 0 Medium Wound Dressing one or multiple wounds []  - 0 Large Wound Dressing one or multiple wounds []  - 0 Application of Medications - topical []  - 0 Application of Medications - injection INTERVENTIONS - Miscellaneous []  - External ear exam 0 []  - 0 Specimen Collection (cultures, biopsies, blood, body fluids, etc.) []  - 0 Specimen(s) / Culture(s) sent or taken to Lab for analysis []  - 0 Patient Transfer (multiple staff / Civil Service fast streamer / Similar devices) []  - 0 Simple Staple / Suture removal (25 or less) []  - 0 Complex Staple / Suture removal (26 or more) []  - 0 Hypo / Hyperglycemic Management (close monitor of Blood Glucose) []  - 0 Ankle / Brachial Index (ABI) - do not check if billed separately X- 1 5 Vital Signs Adam Merritt, Adam Merritt ([id number]) Has the patient been seen at the hospital within the last three years: Yes Total Score: 90 Level Of Care: New/Established - Level 3 Electronic Signature(s) Signed: 04/15/2019 5:03:01 PM By: Montey Hora Entered By: Montey Hora on 04/15/2019 09:41:14 Adam Merritt ([id number]) -------------------------------------------------------------------------------- Encounter Discharge Information Details Patient Name: Adam Merritt Date of Service: 04/15/2019 9:30 AM Medical Record Number: [id number] Patient Account Number: [id number] Date of Birth/Sex: [birth year] (73 y.o. M) Treating RN: Montey Hora Primary Care Traves Majchrzak: [name] Other Clinician: Referring Caitlen Worth: [name] Treating Aymar Whitfill/Extender: Melburn Hake, [name]: 4 Encounter Discharge Information  Items Discharge Condition: Stable Ambulatory Status: Ambulatory Discharge Destination: Home Transportation: Private Auto Accompanied By: wife Schedule Follow-up Appointment: Yes Clinical Summary of Care: Electronic Signature(s) Signed: 04/15/2019 5:03:01 PM By: Montey Hora Entered By: Montey Hora on 04/15/2019 09:42:14 Adam Merritt ([id number]) -------------------------------------------------------------------------------- Lower Extremity Assessment Details Patient Name: Adam Merritt Date of Service: 04/15/2019 9:30 AM Medical Record Number: [id number] Patient Account Number: [id number] Date of Birth/Sex: [birth year] (73 y.o. M) Treating RN: Army Melia Primary Care Tenea Sens: [name] Other Clinician: Referring Zafar Debrosse: [name] Treating Talyn Eddie/Extender: STONE III, [name]: 4 Edema Assessment Assessed: [Left: No] [Right: No] Edema: [Left: No] [Right: No] Vascular Assessment Pulses: Dorsalis Pedis Palpable: [Left:Yes] [Right:Yes] Electronic Signature(s) Signed: 04/17/2019 3:26:34 PM By: Army Melia Entered By: Army Melia on 04/15/2019 09:29:23 Adam Merritt ([id number]) -------------------------------------------------------------------------------- Multi Wound Chart Details Patient Name: Adam Merritt Date of Service: 04/15/2019 9:30 AM Medical Record Number: [id number] Patient Account Number: [id number] Date of Birth/Sex: [birth year] (73 y.o. M) Treating RN: Montey Hora Primary Care Emett Stapel: [name] Other Clinician: Referring Rontavious Albright: [name] Treating Dquan Cortopassi/Extender: Melburn Hake, [name]: 4 Vital Signs Height(in): 54 Pulse(bpm): 74 Weight(lbs): 297 Blood Pressure(mmHg): 166/72 Body Mass Index(BMI): 48 Temperature(F): 99.0 Respiratory Rate 16 (breaths/min): Photos: [N/A:N/A] Wound Location: Right Toe Second - Distal N/A N/A Wounding Event: Gradually Appeared N/A N/A Primary Etiology:  Diabetic Wound/Ulcer of the N/A N/A Lower Extremity Comorbid History: Anemia, Lymphedema, N/A N/A Chronic Obstructive Pulmonary Disease (COPD), Sleep Apnea, Congestive Heart Failure, Coronary Artery Disease, Hypertension, Peripheral Venous Disease, Type II Diabetes, Osteoarthritis, Neuropathy, Received Chemotherapy Date Acquired: 03/08/2019 N/A N/A Weeks of Treatment: 4 N/A N/A Wound Status: Open N/A N/A Measurements L x W x D 0.3x0.3x0.1 N/A N/A (cm) Area (cm) : 0.071 N/A N/A Volume (cm) : 0.007 N/A  N/A % Reduction in Area: 90.90% N/A N/A % Reduction in Volume: 91.00% N/A N/A Classification: Grade 2 N/A N/A Exudate Amount: Medium N/A N/A Exudate Type: Serous N/A N/A Exudate Color: amber N/A N/A Wound Margin: Flat and Intact N/A N/A Granulation Amount: Large (67-100%) N/A N/A Adam Merritt, Adam Merritt ([id number]) Granulation Quality: Red N/A N/A Necrotic Amount: Small (1-33%) N/A N/A Exposed Structures: Fat Layer (Subcutaneous N/A N/A Tissue) Exposed: Yes Fascia: No Tendon: No Muscle: No Joint: No Bone: No Epithelialization: Small (1-33%) N/A N/A Treatment Notes Electronic Signature(s) Signed: 04/15/2019 5:03:01 PM By: Montey Hora Entered By: Montey Hora on 04/15/2019 09:38:33 Adam Merritt ([id number]) -------------------------------------------------------------------------------- Zeba Details Patient Name: Adam Merritt Date of Service: 04/15/2019 9:30 AM Medical Record Number: [id number] Patient Account Number: [id number] Date of Birth/Sex: 1946-04-17 (73 y.o. M) Treating RN: Montey Hora Primary Care Norina Cowper: [name] Other Clinician: Referring Lamiyah Schlotter: [name] Treating Karlynn Furrow/Extender: Melburn Hake, [name]: 4 Active Inactive Abuse / Safety / Falls / Self Care Management Nursing Diagnoses: History of Falls Goals: Patient will remain injury free related to falls Date Initiated: 03/18/2019 Target  Resolution Date: 06/14/2019 Goal Status: Active Interventions: Assess fall risk on admission and as needed Notes: Orientation to the Wound Care Program Nursing Diagnoses: Knowledge deficit related to the wound healing center program Goals: Patient/caregiver will verbalize understanding of the [facility] Date Initiated: 03/18/2019 Target Resolution Date: 06/14/2019 Goal Status: Active Interventions: Provide education on orientation to the wound center Notes: Pressure Nursing Diagnoses: Knowledge deficit related to management of pressures ulcers Goals: Patient will remain free of pressure ulcers Date Initiated: 03/18/2019 Target Resolution Date: 06/14/2019 Goal Status: Active Interventions: Assess: immobility, friction, shearing, incontinence upon admission and as needed Adam Merritt, Adam Merritt ([id number]) Notes: Venous Leg Ulcer Nursing Diagnoses: Actual venous Insuffiency (use after diagnosis is confirmed) Goals: Patient will maintain optimal edema control Date Initiated: 03/18/2019 Target Resolution Date: 06/14/2019 Goal Status: Active Interventions: Assess peripheral edema status every visit. Compression as ordered Notes: Wound/Skin Impairment Nursing Diagnoses: Impaired tissue integrity Goals: Ulcer/skin breakdown will heal within 14 weeks Date Initiated: 03/18/2019 Target Resolution Date: 06/14/2019 Goal Status: Active Interventions: Assess patient/caregiver ability to obtain necessary supplies Assess patient/caregiver ability to perform ulcer/skin care regimen upon admission and as needed Assess ulceration(s) every visit Notes: Electronic Signature(s) Signed: 04/15/2019 5:03:01 PM By: Montey Hora Entered By: Montey Hora on 04/15/2019 09:38:06 Adam Merritt ([id number]) -------------------------------------------------------------------------------- Pain Assessment Details Patient Name: Adam Merritt Date of Service: 04/15/2019 9:30 AM Medical  Record Number: [id number] Patient Account Number: [id number] Date of Birth/Sex: 1945-10-23 (73 y.o. M) Treating RN: Army Melia Primary Care Analucia Hush: [name] Other Clinician: Referring Jalonda Antigua: [name] Treating Aylissa Heinemann/Extender: Melburn Hake, [name]: 4 Active Problems Location of Pain Severity and Description of Pain Patient Has Paino No Site Locations Pain Management and Medication Current Pain Management: Electronic Signature(s) Signed: 04/17/2019 3:26:34 PM By: Army Melia Entered By: Army Melia on 04/15/2019 09:26:26 Adam Merritt ([id number]) -------------------------------------------------------------------------------- Patient/Caregiver Education Details Patient Name: Adam Merritt Date of Service: 04/15/2019 9:30 AM Medical Record Number: [id number] Patient Account Number: [id number] Date of Birth/Gender: 08/02/1945 (73 y.o. M) Treating RN: Montey Hora Primary Care Physician: [name] Other Clinician: Referring Physician: [name] Treating Physician/Extender: Sharalyn Ink in Treatment: 4 Education Assessment Education Provided To: Patient and Caregiver Education Topics Provided Wound/Skin Impairment: Handouts: Other: wound care as ordered Methods: Demonstration, Explain/Verbal Responses: State content correctly Electronic Signature(s) Signed: 04/15/2019 5:03:01 PM  By: Montey Hora Entered By: Montey Hora on 04/15/2019 09:41:35 Adam Merritt ([id number]) -------------------------------------------------------------------------------- Wound Assessment Details Patient Name: Adam Merritt Date of Service: 04/15/2019 9:30 AM Medical Record Number: [id number] Patient Account Number: [id number] Date of Birth/Sex: 04-Jun-1946 (73 y.o. M) Treating RN: Army Melia Primary Care Jermanie Minshall: [name] Other Clinician: Referring Sharlisa Hollifield: [name] Treating Carry Weesner/Extender: Melburn Hake, [name]:  4 Wound Status Wound Number: 24 Primary Diabetic Wound/Ulcer of the Lower Extremity Etiology: Wound Location: Right Toe Second - Distal Wound Open Wounding Event: Gradually Appeared Status: Date Acquired: 03/08/2019 Comorbid Anemia, Lymphedema, Chronic Obstructive Weeks Of Treatment: 4 History: Pulmonary Disease (COPD), Sleep Apnea, Clustered Wound: No Congestive Heart Failure, Coronary Artery Disease, Hypertension, Peripheral Venous Disease, Type II Diabetes, Osteoarthritis, Neuropathy, Received Chemotherapy Photos Wound Measurements Length: (cm) 0.3 % Reduction i Width: (cm) 0.3 % Reduction i Depth: (cm) 0.1 Epithelializa Area: (cm) 0.071 Tunneling: Volume: (cm) 0.007 Undermining: n Area: 90.9% n Volume: 91% tion: Small (1-33%) No No Wound Description Classification: Grade 2 Foul Odor Aft Wound Margin: Flat and Intact Slough/Fibrin Exudate Amount: Medium Exudate Type: Serous Exudate Color: amber er Cleansing: No o Yes Wound Bed Granulation Amount: Large (67-100%) Exposed Structure Granulation Quality: Red Fascia Exposed: No Necrotic Amount: Small (1-33%) Fat Layer (Subcutaneous Tissue) Exposed: Yes Necrotic Quality: Adherent Slough Tendon Exposed: No Muscle Exposed: No Joint Exposed: No Bone Exposed: No Adam Merritt, Adam Merritt ([id number]) Treatment Notes Wound #24 (Right, Distal Toe Second) Notes prisma, foam and coverlet Electronic Signature(s) Signed: 04/17/2019 3:26:34 PM By: Army Melia Entered By: Army Melia on 04/15/2019 09:28:21 Adam Merritt ([id number]) -------------------------------------------------------------------------------- Vitals Details Patient Name: Adam Merritt Date of Service: 04/15/2019 9:30 AM Medical Record Number: [id number] Patient Account Number: [id number] Date of Birth/Sex: Oct 12, 1945 (73 y.o. M) Treating RN: Army Melia Primary Care Saharah Sherrow: [name] Other Clinician: Referring Modean Mccullum: [name] Treating  Laporscha Linehan/Extender: Melburn Hake, [name]: 4 Vital Signs Time Taken: 09:26 Temperature (F): 99.0 Height (in): 66 Pulse (bpm): 74 Weight (lbs): 297 Respiratory Rate (breaths/min): 16 Body Mass Index (BMI): 47.9 Blood Pressure (mmHg): 166/72 Reference Range: 80 - 120 mg / dl Electronic Signature(s) Signed: 04/17/2019 3:26:34 PM By: Army Melia Entered By: Army Melia on 04/15/2019 09:27:02

## 2019-04-22 ENCOUNTER — Other Ambulatory Visit: Payer: Self-pay

## 2019-04-22 ENCOUNTER — Encounter: Payer: Medicare Other | Admitting: Physician Assistant

## 2019-04-22 DIAGNOSIS — E11622 Type 2 diabetes mellitus with other skin ulcer: Secondary | ICD-10-CM | POA: Diagnosis not present

## 2019-04-22 NOTE — Progress Notes (Signed)
SOHIL, TIMKO ([id number]) Visit Report for 04/22/2019 Arrival Information Details Patient Name: Adam Merritt, Adam Merritt Date of Service: 04/22/2019 12:45 PM Medical Record Number: [id number] Patient Account Number: [id number] Date of Birth/Sex: 10-03-1945 (73 y.o. M) Treating RN: Montey Hora Primary Care [name] [name]: [name] Other Clinician: Referring Greenlee Ancheta: [name] Treating Ayrabella Labombard/Extender: Melburn Hake, [name]: 5 Visit Information History Since Last Visit Added or deleted any medications: No Patient Arrived: Ambulatory Any new allergies or adverse reactions: No Arrival Time: 12:37 Had a fall or experienced change in No Accompanied By: wife activities of daily living that may affect Transfer Assistance: None risk of falls: Patient Identification Verified: Yes Signs or symptoms of abuse/neglect since last visito No Secondary Verification Process Yes Hospitalized since last visit: No Completed: Implantable device outside of the clinic excluding No Patient Has Alerts: Yes cellular tissue based products placed in the center Patient Alerts: Patient on Blood since last visit: Thinner Has Dressing in Place as Prescribed: Yes DMII Pain Present Now: No aspirin 325 ABI 10/22/17 L 1.15 R .98 TBI L .66 R .61 Electronic Signature(s) Signed: 04/22/2019 3:46:15 PM By: Lorine Bears RCP, RRT, CHT Entered By: Lorine Bears on 04/22/2019 12:38:20 Adam Merritt ([id number]) -------------------------------------------------------------------------------- Encounter Discharge Information Details Patient Name: Adam Merritt Date of Service: 04/22/2019 12:45 PM Medical Record Number: [id number] Patient Account Number: [id number] Date of Birth/Sex: 06/15/1945 (73 y.o. M) Treating RN: Montey Hora Primary Care Rodneisha Bonnet: [name] Other Clinician: Referring Tuan Tippin: [name] Treating Torria Fromer/Extender: Melburn Hake, [name] in  Treatment: 5 Encounter Discharge Information Items Post Procedure Vitals Discharge Condition: Stable Temperature (F): 98.6 Ambulatory Status: Ambulatory Pulse (bpm): 67 Discharge Destination: Home Respiratory Rate (breaths/min): 16 Transportation: Private Auto Blood Pressure (mmHg): 145/68 Accompanied By: spouse Schedule Follow-up Appointment: Yes Clinical Summary of Care: Electronic Signature(s) Signed: 04/22/2019 1:18:19 PM By: Montey Hora Entered By: Montey Hora on 04/22/2019 13:18:19 Adam Merritt ([id number]) -------------------------------------------------------------------------------- Lower Extremity Assessment Details Patient Name: Adam Merritt Date of Service: 04/22/2019 12:45 PM Medical Record Number: [id number] Patient Account Number: [id number] Date of Birth/Sex: Mar 26, 1946 (73 y.o. M) Treating RN: Army Melia Primary Care Yesica Kemler: [name] Other Clinician: Referring Kathye Cipriani: [name] Treating Evanee Lubrano/Extender: STONE III, [name]: 5 Edema Assessment Assessed: [Left: No] [Right: No] Edema: [Left: N] [Right: o] Vascular Assessment Pulses: Dorsalis Pedis Palpable: [Right:Yes] Electronic Signature(s) Signed: 04/22/2019 3:54:34 PM By: Army Melia Entered By: Army Melia on 04/22/2019 12:43:21 Adam Merritt ([id number]) -------------------------------------------------------------------------------- Multi Wound Chart Details Patient Name: Adam Merritt Date of Service: 04/22/2019 12:45 PM Medical Record Number: [id number] Patient Account Number: [id number] Date of Birth/Sex: 09/10/1945 (73 y.o. M) Treating RN: Montey Hora Primary Care Marcelles Clinard: [name] Other Clinician: Referring Jonmichael Beadnell: [name] Treating Laure Leone/Extender: Melburn Hake, [name]: 5 Vital Signs Height(in): 73 Pulse(bpm): 58 Weight(lbs): 297 Blood Pressure(mmHg): 145/68 Body Mass Index(BMI): 48 Temperature(F):  98.6 Respiratory Rate 18 (breaths/min): Photos: [N/A:N/A] Wound Location: Right Toe Second - Distal N/A N/A Wounding Event: Gradually Appeared N/A N/A Primary Etiology: Diabetic Wound/Ulcer of the N/A N/A Lower Extremity Comorbid History: Anemia, Lymphedema, N/A N/A Chronic Obstructive Pulmonary Disease (COPD), Sleep Apnea, Congestive Heart Failure, Coronary Artery Disease, Hypertension, Peripheral Venous Disease, Type II Diabetes, Osteoarthritis, Neuropathy, Received Chemotherapy Date Acquired: 03/08/2019 N/A N/A Weeks of Treatment: 5 N/A N/A Wound Status: Open N/A N/A Measurements L x W x D 0.2x0.2x0.2 N/A N/A (cm) Area (cm) : 0.031 N/A N/A Volume (cm) : 0.006 N/A N/A % Reduction  in Area: 96.00% N/A N/A % Reduction in Volume: 92.30% N/A N/A Classification: Grade 2 N/A N/A Exudate Amount: Medium N/A N/A Exudate Type: Serous N/A N/A Exudate Color: amber N/A N/A Wound Margin: Flat and Intact N/A N/A Granulation Amount: Medium (34-66%) N/A N/A Adam Merritt, Adam Merritt ([id number]) Granulation Quality: Red, Pink N/A N/A Necrotic Amount: Medium (34-66%) N/A N/A Exposed Structures: Fat Layer (Subcutaneous N/A N/A Tissue) Exposed: Yes Fascia: No Tendon: No Muscle: No Joint: No Bone: No Epithelialization: Small (1-33%) N/A N/A Treatment Notes Electronic Signature(s) Signed: 04/22/2019 4:34:50 PM By: Montey Hora Entered By: Montey Hora on 04/22/2019 13:11:41 Adam Merritt ([id number]) -------------------------------------------------------------------------------- [place] Details Patient Name: Adam Merritt Date of Service: 04/22/2019 12:45 PM Medical Record Number: [id number] Patient Account Number: [id number] Date of Birth/Sex: 1946-01-16 (73 y.o. M) Treating RN: Montey Hora Primary Care Tomio Kirk: [name] Other Clinician: Referring Deborahann Poteat: [name] Treating Kylian Loh/Extender: Melburn Hake, [name]: 5 Active  Inactive Abuse / Safety / Falls / Self Care Management Nursing Diagnoses: History of Falls Goals: Patient will remain injury free related to falls Date Initiated: 03/18/2019 Target Resolution Date: 06/14/2019 Goal Status: Active Interventions: Assess fall risk on admission and as needed Notes: Orientation to the Wound Care Program Nursing Diagnoses: Knowledge deficit related to the wound healing center program Goals: Patient/caregiver will verbalize understanding of the [place] Program Date Initiated: 03/18/2019 Target Resolution Date: 06/14/2019 Goal Status: Active Interventions: Provide education on orientation to the wound center Notes: Pressure Nursing Diagnoses: Knowledge deficit related to management of pressures ulcers Goals: Patient will remain free of pressure ulcers Date Initiated: 03/18/2019 Target Resolution Date: 06/14/2019 Goal Status: Active Interventions: Assess: immobility, friction, shearing, incontinence upon admission and as needed Adam Merritt, Adam Merritt ([id number]) Notes: Venous Leg Ulcer Nursing Diagnoses: Actual venous Insuffiency (use after diagnosis is confirmed) Goals: Patient will maintain optimal edema control Date Initiated: 03/18/2019 Target Resolution Date: 06/14/2019 Goal Status: Active Interventions: Assess peripheral edema status every visit. Compression as ordered Notes: Wound/Skin Impairment Nursing Diagnoses: Impaired tissue integrity Goals: Ulcer/skin breakdown will heal within 14 weeks Date Initiated: 03/18/2019 Target Resolution Date: 06/14/2019 Goal Status: Active Interventions: Assess patient/caregiver ability to obtain necessary supplies Assess patient/caregiver ability to perform ulcer/skin care regimen upon admission and as needed Assess ulceration(s) every visit Notes: Electronic Signature(s) Signed: 04/22/2019 4:34:50 PM By: Montey Hora Entered By: Montey Hora on 04/22/2019 13:11:33 Adam Merritt  ([id number]) -------------------------------------------------------------------------------- Pain Assessment Details Patient Name: Adam Merritt Date of Service: 04/22/2019 12:45 PM Medical Record Number: [id number] Patient Account Number: [id number] Date of Birth/Sex: [birth year] (73 y.o. M) Treating RN: Montey Hora Primary Care Naevia Unterreiner: [name] Other Clinician: Referring Guliana Weyandt: [name] Treating Coulton Schlink/Extender: Melburn Hake, [name]: 5 Active Problems Location of Pain Severity and Description of Pain Patient Has Paino No Site Locations Pain Management and Medication Current Pain Management: Electronic Signature(s) Signed: 04/22/2019 3:46:15 PM By: Paulla Fore, RRT, CHT Signed: 04/22/2019 4:34:50 PM By: Montey Hora Entered By: Lorine Bears on 04/22/2019 12:38:27 Adam Merritt ([id number]) -------------------------------------------------------------------------------- Patient/Caregiver Education Details Patient Name: Adam Merritt Date of Service: 04/22/2019 12:45 PM Medical Record Number: [id number] Patient Account Number: [id number] Date of Birth/Gender: 11/21/1945 (73 y.o. M) Treating RN: Montey Hora Primary Care Physician: [name] Other Clinician: Referring Physician: [name] Treating Physician/Extender: Sharalyn Ink in Treatment: 5 Education Assessment Education Provided To: Patient and Caregiver Education Topics Provided Wound/Skin Impairment: Handouts: Other: wound care as ordered Methods: Demonstration, Explain/Verbal  Responses: State content correctly Electronic Signature(s) Signed: 04/22/2019 4:34:50 PM By: Montey Hora Entered By: Montey Hora on 04/22/2019 13:17:42 Adam Merritt ([id number]) -------------------------------------------------------------------------------- Wound Assessment Details Patient Name: Adam Merritt Date of Service: 04/22/2019 12:45  PM Medical Record Number: [id number] Patient Account Number: [id number] Date of Birth/Sex: [birth year] (73 y.o. M) Treating RN: Army Melia Primary Care Jurrell Royster: [name] Other Clinician: Referring Chandlor Noecker: [name] Treating Addalynne Golding/Extender: Melburn Hake, [name]: 5 Wound Status Wound Number: 24 Primary Diabetic Wound/Ulcer of the Lower Extremity Etiology: Wound Location: Right Toe Second - Distal Wound Open Wounding Event: Gradually Appeared Status: Date Acquired: 03/08/2019 Comorbid Anemia, Lymphedema, Chronic Obstructive Weeks Of Treatment: 5 History: Pulmonary Disease (COPD), Sleep Apnea, Clustered Wound: No Congestive Heart Failure, Coronary Artery Disease, Hypertension, Peripheral Venous Disease, Type II Diabetes, Osteoarthritis, Neuropathy, Received Chemotherapy Photos Wound Measurements Length: (cm) 0.2 % Reduction i Width: (cm) 0.2 % Reduction i Depth: (cm) 0.2 Epithelializa Area: (cm) 0.031 Tunneling: Volume: (cm) 0.006 Undermining: n Area: 96% n Volume: 92.3% tion: Small (1-33%) No No Wound Description Classification: Grade 2 Foul Odor Af Wound Margin: Flat and Intact Slough/Fibri Exudate Amount: Medium Exudate Type: Serous Exudate Color: amber ter Cleansing: No no Yes Wound Bed Granulation Amount: Medium (34-66%) Exposed Structure Granulation Quality: Red, Pink Fascia Exposed: No Necrotic Amount: Medium (34-66%) Fat Layer (Subcutaneous Tissue) Exposed: Yes Necrotic Quality: Adherent Slough Tendon Exposed: No Muscle Exposed: No Joint Exposed: No Bone Exposed: No Adam Merritt, Adam Merritt ([id number]) Treatment Notes Wound #24 (Right, Distal Toe Second) Notes prisma, foam and coverlet Electronic Signature(s) Signed: 04/22/2019 3:54:34 PM By: Army Melia Entered By: Army Melia on 04/22/2019 12:42:25 Adam Merritt ([id number]) -------------------------------------------------------------------------------- Vitals  Details Patient Name: Adam Merritt Date of Service: 04/22/2019 12:45 PM Medical Record Number: [id number] Patient Account Number: [id number] Date of Birth/Sex: 1946/03/25 (73 y.o. M) Treating RN: Montey Hora Primary Care Nadea Kirkland: [name] Other Clinician: Referring Annamarie Yamaguchi: [name] Treating Dickie Labarre/Extender: Melburn Hake, [name]: 5 Vital Signs Time Taken: 12:35 Temperature (F): 98.6 Height (in): 66 Pulse (bpm): 67 Weight (lbs): 297 Respiratory Rate (breaths/min): 18 Body Mass Index (BMI): 47.9 Blood Pressure (mmHg): 145/68 Reference Range: 80 - 120 mg / dl Electronic Signature(s) Signed: 04/22/2019 3:46:15 PM By: Lorine Bears RCP, RRT, CHT Entered By: Becky Sax, Amado Nash on 04/22/2019 12:39:27

## 2019-04-22 NOTE — Progress Notes (Addendum)
Adam, Merritt ([id number]) Visit Report for 04/22/2019 Chief Complaint Document Details Patient Name: Adam Merritt, Adam Merritt Date of Service: 04/22/2019 12:45 PM Medical Record Number: [id number] Patient Account Number: [id number] Date of Birth/Sex: July 20, 1945 (73 y.o. M) Treating RN: Montey Hora Primary Care Provider: [name] Other Clinician: Referring Provider: [name] Treating Provider/Extender: Melburn Hake, [name]: 5 Information Obtained from: Patient Chief Complaint Right toe and left LE Ulcer Electronic Signature(s) Signed: 04/22/2019 1:09:02 PM By: Worthy Keeler PA-C Entered By: Worthy Keeler on 04/22/2019 13:09:01 Adam Merritt ([id number]) -------------------------------------------------------------------------------- Debridement Details Patient Name: Adam Merritt Date of Service: 04/22/2019 12:45 PM Medical Record Number: [id number] Patient Account Number: [id number] Date of Birth/Sex: [birth year] (73 y.o. M) Treating RN: Montey Hora Primary Care Provider: [name] Other Clinician: Referring Provider: [name] Treating Provider/Extender: Melburn Hake, [name]: 5 Debridement Performed for Wound #24 Right,Distal Toe Second Assessment: Performed By: Physician STONE III, HOYT E., PA-C Debridement Type: Debridement Severity of Tissue Pre Fat layer exposed Debridement: Level of Consciousness (Pre- Awake and Alert procedure): Pre-procedure Verification/Time Yes - 13:11 Out Taken: Start Time: 13:11 Pain Control: Lidocaine 4% Topical Solution Total Area Debrided (L x W): 0.2 (cm) x 0.2 (cm) = 0.04 (cm) Tissue and other material Viable, Non-Viable, Callus, Slough, Subcutaneous, Slough debrided: Level: Skin/Subcutaneous Tissue Debridement Description: Excisional Instrument: Curette Bleeding: Minimum Hemostasis Achieved: Pressure End Time: 13:14 Procedural Pain: 0 Post Procedural Pain: 0 Response to Treatment:  Procedure was tolerated well Level of Consciousness Awake and Alert (Post-procedure): Post Debridement Measurements of Total Wound Length: (cm) 0.2 Width: (cm) 0.2 Depth: (cm) 0.1 Volume: (cm) 0.003 Character of Wound/Ulcer Post Debridement: Improved Severity of Tissue Post Debridement: Fat layer exposed Post Procedure Diagnosis Same as Pre-procedure Electronic Signature(s) Signed: 04/22/2019 4:34:50 PM By: Montey Hora Signed: 04/22/2019 6:03:25 PM By: Worthy Keeler PA-C Entered By: Montey Hora on 04/22/2019 13:14:38 Adam Merritt ([id number]) -------------------------------------------------------------------------------- HPI Details Patient Name: Adam Merritt Date of Service: 04/22/2019 12:45 PM Medical Record Number: [id number] Patient Account Number: [id number] Date of Birth/Sex: 1946-05-24 (73 y.o. M) Treating RN: Montey Hora Primary Care Provider: [name] Other Clinician: Referring Provider: [name] Treating Provider/Extender: Melburn Hake, [name]: 5 History of Present Illness HPI Description: 10/18/17-He is here for initial evaluation of bilateral lower extremity ulcerations in the presence of venous insufficiency and lymphedema. He has been seen by vascular medicine in the past, Dr. Lucky Cowboy, last seen in 2016. He does have a history of abnormal ABIs, which is to be expected given his lymphedema and venous insufficiency. According to Epic, it appears that all attempts for arterial evaluation and/or angiography were not follow through with by patient. He does have a history of being seen in lymphedema clinic in 2018, stopped going approximately 6 months ago stating "it didn't do any good". He does not have lymphedema pumps, he does not have custom fit compression wrap/stockings. He is diabetic and his recent A1c last month was 7.6. He admits to chronic bilateral lower extremity pain, no change in pain since blister and ulceration development.  He is currently being treated with Levaquin for bronchitis. He has home health and we will continue. 10/25/17-He is here in follow-up evaluation for bilateral lower extremity ulcerationssubtle he remains on Levaquin for bronchitis. Right lower extremity with no evidence of drainage or ulceration, persistent left lower extremity ulceration. He states that home health has not been out since his appointment. He went to [facility] on Tuesday, studies revealed: RIGHT ABI 0.9, TBI 0.6 LEFT ABI 1.1, TBI 0.6 with triphasic flow bilaterally. We will continue his same treatment plan. He has been educated on compression therapy and need for elevation. He will benefit from lymphedema pumps 11/01/17-He is here in follow-up evaluation for left lower extremity ulcer. The right lower extremity remains healed. He has home health services, but they have not been out to see the patient for 2-3 weeks. He states it home health physical therapy changed his dressing yesterday after therapy; he placed Ace wrap compression. We are still waiting for lymphedema pumps, reordered d/t need for company change. 11/08/17-He is here in follow-up evaluation for left lower extremity ulcer. It is improved. Edema is significantly improved with compression therapy. We will continue with same treatment plan and he will follow-up next week. No word regarding lymphedema pumps 11/15/17-He is here in follow-up evaluation for left lower extremity ulcer. He is healed and will be discharged from wound care services. I have reached out to [facility] regarding his lymphedema pumps. They have been unable to reach the patient; the contact number they had with the patient's wife's cell phone and she has not answered any unrecognized calls. Contact should be made today, trial planned for next week; [facility] will continue to follow 11/27/17 on evaluation today patient has multiple blistered areas over the right lower extremity  his left lower extremity appears to be doing okay. These blistered areas show signs of no infection which is great news. With that being said he did have some necrotic skin overlying which was mechanically debrided away with saline and gauze today without complication. Overall post debridement the wounds appear to be doing better but in general his swelling seems to be increased. This is obviously not good news. I think this is what has given rise to the blisters. 12/04/17 on evaluation today patient presents for follow-up concerning his bilateral lower extremity edema in the right lower extremity ulcers. He has been tolerating the dressing changes without complication. With that being said he has had no real issues with the wraps which is also good news. Overall I'm pleased with the progress he's been making. 12/11/17 on evaluation today patient appears to be doing rather well in regard to his right lateral lower extremity ulcer. He's been tolerating the dressing changes without complication. Fortunately there does not appear to be any evidence of infection at this time. Overall I'm pleased with the progress that is being made. Unfortunately he has been in the hospital due to having what sounds to be a stomach virus/flu fortunately that is starting to get better. 12/18/17 on evaluation today patient actually appears to be doing very well in regard to his bilateral lower extremities the swelling is under fairly good control his lymphedema pumps are still not up and running quite yet. With that being said he does have several areas of opening noted as far as wounds are concerned mainly over the left lower extremity. With that being said I do believe once he gets lymphedema pumps this would least help you mention some the fluid and preventing this from occurring. Hopefully that will be set up soon sleeves are Artie in place at his home he just waiting for the machine. 12/25/17 on evaluation today patient  actually appears to be doing excellent in fact all of his ulcers appear to have resolved his MEIR, ELWOOD. ([id number]) legs appear very well. I do think he needs compression stockings we have discussed  this and they are actually going to go to  today to elastic therapy to get this fitted for him. I think that is definitely a good thing to do. Readmission: 04/09/18 upon evaluation today this patient is seen for readmission due to bilateral lower extremity lymphedema. He has significant swelling of his extremities especially on the left although the right is also swollen he has weeping from both sides. There are no obvious open wounds at this point. Fortunately he has been doing fairly well for quite a bit of time since I last saw him. Nonetheless unfortunately this seems to have reopened and is giving quite a bit of trouble. He states this began about a week ago when he first called [place] to get in to be seen. No fevers, chills, nausea, or vomiting noted at this time. He has not been using his lymphedema pumps due to the fact that they won't fit on his leg at this point likewise is also not been using his compression for essentially the same reason. 04/16/18 upon evaluation today patient actually appears to be doing a little better in regard to the fluid in his bilateral lower extremities. With that being said he's had three falls since I saw him last week. He also states that he's been feeling very poorly. I was concerned last week and feel like that the concern is still there as far as the congestion in his chest is concerned he seems to be breathing about the same as last week but again he states he's very weak he's not even able to walk further than from the chair to the door. His wife had to buy a wheelchair just to be able to get them out of the house to get to the appointment today. This has me very concerned. 04/23/18 on evaluation today patient actually appears to be doing much better  than last week's evaluation. At that time actually had to transport him to the ER via EMS and he subsequently was admitted for acute pulmonary edema, acute renal failure, and acute congestive heart failure. Fortunately he is doing much better. Apparently they did dialyze him and were able to take off roughly 35 pounds of fluid. Nonetheless he is feeling much better both in regard to his breathing and he's able to get around much better at this time compared to previous. Overall I'm very happy with how things are at this time. There does not appear to be any evidence of infection currently. No fevers, chills, nausea, or vomiting noted at this time. 04/30/2018 patient seen today for follow-up and management of bilateral lower extremity lymphedema. He did express being more sad today than usual due to the recent loss of his dog. He states that he has been compliant with using the lymphedema pumps. However he does admit a minute over the last 2-3 days he has not been using the pumps due to the recent loss of his dog. At this time there is no drainage or open wounds to his lower extremities. The left leg edema is measuring smaller today. Still has a significant amount of edema on bilateral lower extremities With dry flaky skin. He will be referred to the lymphedema clinic for further management. Will continue 3 layer compression wraps and follow-up in 1-2 weeks.Denies any pain, fever, chairs recently. No recent falls or injuries reported during this visit. 05/07/18 on evaluation today patient actually appears to be doing very well in regard to his lower extremities in general all things considered. With that  being said he is having some pain in the legs just due to the amount of swelling. He does have an area where he had a blister on the left lateral lower extremity this is open at this point other than that there's nothing else weeping at this time. 05/14/18 on evaluation today patient actually appears  to be doing excellent all things considered in regard to his lower extremities. He still has a couple areas of weeping on each leg which has continued to be the issue for him. He does have an appointment with the lymphedema clinic although this isn't until February 2020. That was the earliest they had. In the meantime he has continued to tolerate the compression wraps without complication. 05/28/18 on evaluation today patient actually appears to be doing more poorly in regard to his left lower extremity where he has a wound open at this point. He also had a fall where he subsequently injured his right great toe which has led to an open wound at the site unfortunately. He has been tolerating the dressing changes without complication in general as far as the wraps are concerned that he has not been putting any dressing on the left 1st toe ulcer site. 06/11/18 on evaluation today patient appears to be doing much worse in regard to his bilateral lower extremity ulcers. He has been tolerating the dressing changes without complication although his legs have not been wrapped more recently. Overall I am not very pleased with the way his legs appear. I do believe he needs to be back in compression wraps he still has not received his compression wraps from the [facility] hospital as of yet. 06/18/18 on evaluation today patient actually appears to be doing significantly better than last time I saw him. He has been tolerating the compression wraps without complication in the circumferential ulcers especially appear to be doing much better. His toe ulcer on the right in regard to the great toe is better although not as good as the legs in my pinion. No fevers chills noted VANNAK, MONTENEGRO ([id number]) 07/02/18 on evaluation today patient appears to be doing much better in regard to his lower extremity ulcers. Unfortunately since I last saw him he's had the distal portion of his right great toe if he dated it sounds as if  this actually went downhill very quickly. I had only seen him a few days prior and the toe did not appear to be infected at that point subsequently became infected very rapidly and it was decided by the surgeon that the distal portion of the toe needed to be removed. The patient seems to be doing well in this regard he tells me. With that being said his lower extremities are doing better from the standpoint of the wounds although he is significantly swollen at this point. 07/09/18 on evaluation today patient appears to be doing better in regard to the wounds on his lower extremities. In fact everything is almost completely healed he is just a small area on the left posterior lower extremity that is open at this point. 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 With that being said he needs some kind of compression it does sound like he may not be wearing his compression, that is the wraps, during the entire time between when he's here visit to visit.  Apparently his wife took the current one off because it began to "fall apart". 07/16/18 on evaluation today patient appears to be extremely swollen especially in regard to his left lower extremity unfortunately. He also has a new skin tear over the left lower extremity and there's a smaller area on the right lower extremity as well. Unfortunately this seems to be due in part to blistering and fluid buildup in his leg. He did get the reduction wraps that were ordered by the [facility] hospital for him to go to lymphedema clinic. With that being said his wounds on the legs have not healed to the point to where they would likely accept them as a patient lymphedema clinic currently. We need to try to get this to heal. With that being said he's been taking his wraps off which is not doing  him any favors at this point. In fact this is probably quite counterproductive compared to what needs to occur. We will likely need to increase to a four layer compression wrap and continue to also utilize elevation and he has to keep the wraps on not take them off as he's been doing currently hasn't had a wrap on since Saturday. 07/23/18 on evaluation today patient appears to be doing much better in regard to his bilateral lower extremities. In fact his left lower extremity which was the largest is actually 15 cm smaller today compared to what it was last time he was here in our clinic. This is obviously good news after just one week. Nonetheless the differences he actually kept the wraps on during the entire week this time. That's not typical for him. I do believe he understands a little bit better now the severity of the situation and why it's important for him to keep these wraps on. 07/30/18 on evaluation today patient actually appears to be doing rather well in regard to his lower extremities. His legs are much smaller than they have been in the past and he actually has only one very small rudder superficial region remaining that is not closed on the left lateral/posterior lower extremity even this is almost completely close. I do believe likely next week he will be healed without any complications. I do think we need to continue the wraps however this seems to be beneficial for him. I also think it may be a good time for [place] to go ahead and see about getting the appointment with the lymphedema clinic which is supposed to be made for him in order to keep this moving along and hopefully get them into compression wraps that will in the end help him to remain healed. 08/06/18 on evaluation today patient actually appears to be doing very well in regard as bilateral lower extremities. In fact his wounds appear to be completely healed at this time. He does have bilateral lymphedema which has been  extremely well controlled with the compression wraps. He is in the process of getting appointment with the lymphedema clinic we have made this referral were just waiting to hear back on the schedule time. We need to follow up on that today as well. 08/13/18 on evaluation today patient actually appears to be doing very well in regard to his bilateral lower extremities there are no open wounds at this point. We are gonna go ahead and see about ordering the Velcro compression wraps for him had a discussion with them about [place] doing it versus the [place] they feel like they can definitely afford going ahead and get  the wraps themselves and they would prefer to try to avoid having to go to the lymphedema clinic if it all possible which I completely understand. As long as he has good compression I'm okay either way. 3/17//20 on evaluation today patient actually appears to be doing well in regard to his bilateral lower extremity ulcers. He has been tolerating the dressing changes without complication specifically the compression wraps. Overall is had no issues my fingers finding that I see at this point is that he is having trouble with constipation. He tells me he has not been able to go to the bathroom for about six days. He's taken to over-the-counter oral laxatives unfortunately this is not helping. He has not contacted his doctor. 08/27/18 on evaluation today patient appears to be doing fairly well in regard to his lower extremities at this point. There does not appear to be any new altars is swelling is very well controlled. We are still waiting for his Velcro compression wraps to arrive that should be sometime in the next week is Artie sent the check for these. 09/03/18 on evaluation today patient appears to be doing excellent in regard to his bilateral lower extremities which he shows HALTON, NEAS. ([id number]) no signs of wound openings in regard to this point. He does have his Velcro compression wraps  which did arrive in the mail since I saw him last week. Overall he is doing excellent in my pinion. 09/13/18 on evaluation today patient appears to be doing very well currently in regard to the overall appearance of his bilateral lower extremities although he's a little bit more swollen than last time we saw him. At that point he been discharged without any open wounds. Nonetheless he has a small open wound on the posterior left lower extremity with some evidence of cellulitis noted as well. Fortunately I feel like he has made good progress overall with regard to his lower extremities from were things used to be. 09/20/18 on evaluation today patient actually appears to be doing much better. The erythematous lower extremity is improving wound itself which is still open appears to be doing much better as far as but appearance as well as pain is concerned overall very pleased in this regard. There's no signs of active infection at this time. 09/27/18 on evaluation today patient's wounds on the lower extremity actually appear to be doing fairly well at this time which is good news. There is no evidence of active infection currently and again is just as left lower extremity were there any wounds at this point anyway. I believe they may be completely healed but again I'm not 100% sure based on evaluation today. I think one more week of observation would likely be a good idea. 10/04/18 on evaluation today patient actually appears to be doing excellent in regard to the left lower extremity which actually appears to be completely healed as of today. Unfortunately he's been having issues with his right lower extremity have a new wound that has opened. Fortunately there's no evidence of active infection at this time which is good news. 10/10/18 upon evaluation today patient actually appears to be doing a little bit worse with the new open area on his right posterior lower extremity. He's been tolerating the dressing  changes without complication. Right now we been using his compression wraps although I think we may need to switch back to actually performing bilateral compression wraps are in the clinic. No fevers, chills, nausea, or vomiting noted at this  time. 10/17/18 on evaluation today patient actually appears to be doing quite well in regard to his bilateral lower extremity ulcers. He has been tolerating the reps without complication although he would prefer not to rewrap his legs as of today. Fortunately there's no signs of active infection which is good news. No fevers, chills, nausea, or vomiting noted at this time. 10/24/18 on evaluation today patient appears to be doing very well in regard to his lower extremities. His right lower extremity is shown signs of healing and his left lower [place] though not healed appears to be improving which is excellent news. Overall very pleased with how things seem to be progressing at this time. The patient is likewise happy to hear this. His Velcro compression wraps however have not been put on properly will gonna show his wife how to do that properly today. 10/31/18 on evaluation today patient appears to be doing more poorly in regard to his left lower extremity in particular although both lower extremities actually are showing some signs of being worse than my previous evaluation. Unfortunately I'm just not sure that his compression stockings even with the use of the compression/lymphedema pumps seem to be controlling this well. Upon further questioning he tells me that he also is not able to lie flat in the bed due to his congestive heart failure and difficulty breathing. For that reason he sleeps in his recliner. To make matters worse his recliner also cannot even hold his legs up so instead of even being somewhat elevated they pretty much hang down to the floor. This is the way he sleeps each night which is definitely counterproductive to everything else that were  attempting to do from the standpoint of controlling his fluid. Nonetheless I think that he potentially could benefit from a hospital bed although this would be something that his primary care provider would likely have to order since anything that is order on our side has to be directly related to wound care and again the hospital bed is not necessarily a direct relation to although I think it does contribute to his overall wound status on his lower extremities. 11/07/18 on evaluation today patient appears to be doing better in regard to his bilateral lower extremity. He's been tolerating the dressing changes without complication. We did in the interim since I last saw him switch to just using extras orbiting new alginate and that seems to have done much better for him. I'm very pleased with the overall progress that is made. 11/14/18 on evaluation today patient appears to be redoing rather well in regard to his left lower extremity ulcers which are the only ones remaining at this point. Fortunately there's no signs of active infection at this time which is good news. Overall been very pleased with how things seem to be progressing currently. No fevers, chills, nausea, or vomiting noted at this time. 11/20/17 on evaluation today patient actually appears to be doing quite well in regard to his lower extremities on the left on the right he has several blisters that showed up although there's some question about whether or not he has had his broker compression wraps on like he was supposed to or not. Fortunately there's no signs of active infection at this time which is good news. Unfortunately though he is doing well from the standpoint of the left leg the right leg is not doing as well again this VALDEMAR, MCCLENAHAN. ([id number]) is when we did not wrap last week. 11/28/18 on evaluation  today patient appears to be doing well in regard to his bilateral lower extremities is left appears to be healed is right is  not healed but is very close to being so. Overall very pleased with how things seem to be progressing. Patient is likewise happy that things are doing well. 12/09/18 on evaluation today patient actually appears to be completely healed which is excellent news. He actually seems to be doing well in regard to the swelling of the bilateral lower extremities which is also great news. Overall very pleased with how things seem to be progressing. Readmission: 03/18/2019 upon evaluation today patient presents for reevaluation concerning issues that he is having with his left lower extremity where he does have a wound noted upon inspection today. He also has a wound on the right second toe on the tip where it is apparently been rubbing in his shoe I did look at issues and you can actually see where this has been occurring as well. Fortunately there is no signs of infection with regard to the toe necessarily although it is very swollen compared to normal that does have me somewhat concerned about the possibility of further evaluation for infection/osteomyelitis. I would recommend an x-ray to start with. Otherwise he states that it is been 1-2 weeks that he has had the draining in regard to left lower extremity he does not know how this happened he has been wearing his compression appropriately and I do see that as well today but nonetheless I do think that he needs to continue to be very cautious with regard to elevation as well. 03/25/2019 on evaluation today patient appears to be doing much better in regard to his lower extremity at this time. That is on the left. He did culture positive for Pseudomonas but the good news is he seems to be doing much better the gentamicin we applied topically seems to have done a great job for him. There is no signs of active infection at this time systemically and locally he is doing much better. No fevers, chills, nausea, vomiting, or diarrhea. In regard to his right toe this  seems to be doing much better there is very little pressure noted at this point I did clean up some of the callus today around the edges of the wound as well as the surface of the wound but to be honest he is progressing quite nicely. 10/30; the area on the left anterior lower tibia area is healed over. His edema control is adequate even though his use of the compression pumps seems very intermittent. He has a juxta lite stocking on the right leg and a think there is 1 for the left leg at home. His wife is putting these on. He has an area on the plantar right second toe which is a hammertoe they are trying to offload this 04/08/2019 on evaluation today patient appears to be doing well in regard to his lower extremities which is good news. We are seeing him today for his toe ulcer which is giving him a little bit more trouble but again still seems to be doing better at this point which is good news. He is going require some sharp debridement in regard to the toe today however. 04/15/2019 on evaluation today patient actually appears to be doing quite well with regard to his toe ulcer. I am very pleased with how things are doing in that regard. With regard to his lower extremity edema in general he tells me he is not  been using the lymphedema pumps regularly although he does not use them. I think that he needs to use them more regularly he does have a small blister on the left lower extremity this is not open at this point but obviously it something that can get worse if he does not keep the compression going and the pumps going as well. 04/22/2019 on evaluation today patient appears to be doing well with regard to his toe ulcer. He has been tolerating the dressing changes without complication. This is measuring slightly smaller this week as compared to last week. Fortunately there is no signs of active infection at this time. Electronic Signature(s) Signed: 04/22/2019 1:16:16 PM By: Worthy Keeler  PA-C Entered By: Worthy Keeler on 04/22/2019 13:16:15 Adam Merritt ([id number]) -------------------------------------------------------------------------------- Physical Exam Details Patient Name: Adam Merritt Date of Service: 04/22/2019 12:45 PM Medical Record Number: [id number] Patient Account Number: [id number] Date of Birth/Sex: [birth year] (73 y.o. M) Treating RN: Montey Hora Primary Care Provider: [name] Other Clinician: Referring Provider: [name] Treating Provider/Extender: STONE III, [name]: 5 Constitutional Well-nourished and well-hydrated in no acute distress. Respiratory normal breathing without difficulty. Psychiatric this patient is able to make decisions and demonstrates good insight into disease process. Alert and Oriented x 3. pleasant and cooperative. Notes Patient's wound bed showed signs of good granulation at this point there does not appear to be any evidence of active infection which is good news. Fortunately there is no signs of inflammation at this time either. I did have to remove some callus today post debridement the wound bed appears to be doing much better and is not even as deep as it appeared upon initial inspection. Electronic Signature(s) Signed: 04/22/2019 1:16:49 PM By: Worthy Keeler PA-C Entered By: Worthy Keeler on 04/22/2019 13:16:47 Adam Merritt ([id number]) -------------------------------------------------------------------------------- Physician Orders Details Patient Name: Adam Merritt Date of Service: 04/22/2019 12:45 PM Medical Record Number: [id number] Patient Account Number: [id number] Date of Birth/Sex: 1945-12-08 (73 y.o. 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 Primary Wound Dressing Wound #24 Right,Distal Toe Second o Silver Collagen Secondary Dressing Wound #24 Right,Distal Toe Second o Foam - with coverlet or bandaid Dressing Change Frequency Wound #24 Right,Distal Toe Second o Change dressing every other day. Follow-up Appointments Wound #24 Right,Distal Toe Second o Return Appointment in 2 weeks. Edema Control o Patient to wear own Velcro compression garment. o Elevate legs to the level of the heart and pump ankles as often as possible o Compression Pump: Use compression pump on left lower extremity for 60 minutes, twice daily. Electronic Signature(s) Signed: 04/22/2019 4:34:50 PM By: Montey Hora Signed: 04/22/2019 6:03:25 PM By: Worthy Keeler PA-C Entered By: Montey Hora on 04/22/2019 13:15:34 Adam Merritt ([id number]) -------------------------------------------------------------------------------- Problem List Details Patient Name: TAYSOM, GLYMPH. Date of Service: 04/22/2019 12:45  PM Medical Record Number: [id number] Patient Account Number: [id number] Date of Birth/Sex: 1945-07-18 (73 y.o. M) Treating RN: Montey Hora Primary Care Provider: [name] Other Clinician: Referring Provider: [name] Treating Provider/Extender: Melburn Hake, [name]: 5 Active Problems ICD-10 Evaluated Encounter Code Description Active Date Today Diagnosis I89.0 Lymphedema, not elsewhere classified  03/18/2019 No Yes E11.621 Type 2 diabetes mellitus with foot ulcer 03/18/2019 No Yes L97.512 Non-pressure chronic ulcer of other part of right foot with fat 03/18/2019 No Yes layer exposed L97.822 Non-pressure chronic ulcer of other part of left lower leg with 03/18/2019 No Yes fat layer exposed E66.09 Other obesity due to excess calories 03/18/2019 No Yes Inactive Problems Resolved Problems Electronic Signature(s) Signed: 04/22/2019 1:08:55 PM By: Worthy Keeler PA-C Entered By: Worthy Keeler on 04/22/2019 13:08:55 Adam Merritt ([id number]) -------------------------------------------------------------------------------- Progress Note Details Patient Name: Adam Merritt Date of Service: 04/22/2019 12:45 PM Medical Record Number: [id number] Patient Account Number: [id number] Date of Birth/Sex: 09/07/1945 (73 y.o. M) Treating RN: Montey Hora Primary Care Provider: [name] Other Clinician: Referring Provider: [name] Treating Provider/Extender: Melburn Hake, [name]: 5 Subjective Chief Complaint Information obtained from Patient Right toe and left LE Ulcer History of Present Illness (HPI) 10/18/17-He is here for initial evaluation of bilateral lower extremity ulcerations in the presence of venous insufficiency and lymphedema. He has been seen by vascular medicine in the past, Dr. Lucky Cowboy, last seen in 2016. He does have a history of abnormal ABIs, which is to be expected given his lymphedema and venous insufficiency. According to Epic, it appears that all attempts for arterial evaluation and/or angiography were not follow through with by patient. He does have a history of being seen in lymphedema clinic in 2018, stopped going approximately 6 months ago stating "it didn't do any good". He does not have lymphedema pumps, he does not have custom fit compression wrap/stockings. He is diabetic and his recent A1c last month was 7.6. He admits to chronic bilateral  lower extremity pain, no change in pain since blister and ulceration development. He is currently being treated with Levaquin for bronchitis. He has home health and we will continue. 10/25/17-He is here in follow-up evaluation for bilateral lower extremity ulcerationssubtle he remains on Levaquin for bronchitis. Right lower extremity with no evidence of drainage or ulceration, persistent left lower extremity ulceration. He states that home health has not been out since his appointment. He went to [facility] on Tuesday, studies revealed: RIGHT ABI 0.9, TBI 0.6 LEFT ABI 1.1, TBI 0.6 with triphasic flow bilaterally. We will continue his same treatment plan. He has been educated on compression therapy and need for elevation. He will benefit from lymphedema pumps 11/01/17-He is here in follow-up evaluation for left lower extremity ulcer. The right lower extremity remains healed. He has home health services, but they have not been out to see the patient for 2-3 weeks. He states it home health physical therapy changed his dressing yesterday after therapy; he placed Ace wrap compression. We are still waiting for lymphedema pumps, reordered d/t need for company change. 11/08/17-He is here in follow-up evaluation for left lower extremity ulcer. It is improved. Edema is significantly improved with compression therapy. We will continue with same treatment plan and he will follow-up next week. No word regarding lymphedema pumps 11/15/17-He is here in follow-up evaluation for left lower extremity ulcer. He is healed and will be discharged from wound care services. I have reached out to [facility] regarding his lymphedema pumps. They have been unable to reach the patient; the contact number they had with the patient's wife's cell phone and she has not answered any unrecognized calls. Contact should be made today, trial planned for next week; [facility] will continue to follow 11/27/17 on  evaluation today patient has multiple blistered areas over the right lower extremity his left lower extremity appears to be doing okay. These blistered areas show signs of no infection which is great news. With that being said he did have some necrotic skin overlying which was mechanically debrided away with saline and gauze today without complication. Overall post debridement the wounds appear to be doing better but in general his swelling seems to be increased. This is obviously not good news. I think this is what has given rise to the blisters. 12/04/17 on evaluation today patient presents for follow-up concerning his bilateral lower extremity edema in the right lower extremity ulcers. He has been tolerating the dressing changes without complication. With that being said he has had no real issues with the wraps which is also good news. Overall I'm pleased with the progress he's been making. 12/11/17 on evaluation today patient appears to be doing rather well in regard to his right lateral lower extremity ulcer. He's been tolerating the dressing changes without complication. Fortunately there does not appear to be any evidence of infection at this time. Overall I'm pleased with the progress that is being made. Unfortunately he has been in the hospital due to having what sounds to be a stomach virus/flu fortunately that is starting to get better. 12/18/17 on evaluation today patient actually appears to be doing very well in regard to his bilateral lower extremities the CLEMMIE, MARXEN. ([id number]) swelling is under fairly good control his lymphedema pumps are still not up and running quite yet. With that being said he does have several areas of opening noted as far as wounds are concerned mainly over the left lower extremity. With that being said I do believe once he gets lymphedema pumps this would least help you mention some the fluid and preventing this from occurring. Hopefully that will be set up  soon sleeves are Artie in place at his home he just waiting for the machine. 12/25/17 on evaluation today patient actually appears to be doing excellent in fact all of his ulcers appear to have resolved his legs appear very well. I do think he needs compression stockings we have discussed this and they are actually going to go to [place] today to elastic therapy to get this fitted for him. I think that is definitely a good thing to do. Readmission: 04/09/18 upon evaluation today this patient is seen for readmission due to bilateral lower extremity lymphedema. He has significant swelling of his extremities especially on the left although the right is also swollen he has weeping from both sides. There are no obvious open wounds at this point. Fortunately he has been doing fairly well for quite a bit of time since I last saw him. Nonetheless unfortunately this seems to have reopened and is giving quite a bit of trouble. He states this began about a week ago when he first called [place] to get in to be seen. No fevers, chills, nausea, or vomiting noted at this time. He has not been using his lymphedema pumps due to the fact that they won't fit on his leg at this point likewise is also not been using his compression for essentially the same reason.  04/16/18 upon evaluation today patient actually appears to be doing a little better in regard to the fluid in his bilateral lower extremities. With that being said he's had three falls since I saw him last week. He also states that he's been feeling very poorly. I was concerned last week and feel like that the concern is still there as far as the congestion in his chest is concerned he seems to be breathing about the same as last week but again he states he's very weak he's not even able to walk further than from the chair to the door. His wife had to buy a wheelchair just to be able to get them out of the house to get to the appointment today. This has me very  concerned. 04/23/18 on evaluation today patient actually appears to be doing much better than last week's evaluation. At that time actually had to transport him to the ER via EMS and he subsequently was admitted for acute pulmonary edema, acute renal failure, and acute congestive heart failure. Fortunately he is doing much better. Apparently they did dialyze him and were able to take off roughly 35 pounds of fluid. Nonetheless he is feeling much better both in regard to his breathing and he's able to get around much better at this time compared to previous. Overall I'm very happy with how things are at this time. There does not appear to be any evidence of infection currently. No fevers, chills, nausea, or vomiting noted at this time. 04/30/2018 patient seen today for follow-up and management of bilateral lower extremity lymphedema. He did express being more sad today than usual due to the recent loss of his dog. He states that he has been compliant with using the lymphedema pumps. However he does admit a minute over the last 2-3 days he has not been using the pumps due to the recent loss of his dog. At this time there is no drainage or open wounds to his lower extremities. The left leg edema is measuring smaller today. Still has a significant amount of edema on bilateral lower extremities With dry flaky skin. He will be referred to the lymphedema clinic for further management. Will continue 3 layer compression wraps and follow-up in 1-2 weeks.Denies any pain, fever, chairs recently. No recent falls or injuries reported during this visit. 05/07/18 on evaluation today patient actually appears to be doing very well in regard to his lower extremities in general all things considered. With that being said he is having some pain in the legs just due to the amount of swelling. He does have an area where he had a blister on the left lateral lower extremity this is open at this point other than that there's  nothing else weeping at this time. 05/14/18 on evaluation today patient actually appears to be doing excellent all things considered in regard to his lower extremities. He still has a couple areas of weeping on each leg which has continued to be the issue for him. He does have an appointment with the lymphedema clinic although this isn't until February 2020. That was the earliest they had. In the meantime he has continued to tolerate the compression wraps without complication. 05/28/18 on evaluation today patient actually appears to be doing more poorly in regard to his left lower extremity where he has a wound open at this point. He also had a fall where he subsequently injured his right great toe which has led to an open wound at the site unfortunately.  He has been tolerating the dressing changes without complication in general as far as the wraps are concerned that he has not been putting any dressing on the left 1st toe ulcer site. 06/11/18 on evaluation today patient appears to be doing much worse in regard to his bilateral lower extremity ulcers. He has been tolerating the dressing changes without complication although his legs have not been wrapped more recently. Overall I am not very pleased with the way his legs appear. I do believe he needs to be back in compression wraps he still has not received his compression wraps from the [facility] hospital as of yet. JAYDIEN, PANEPINTO ([id number]) 06/18/18 on evaluation today patient actually appears to be doing significantly better than last time I saw him. He has been tolerating the compression wraps without complication in the circumferential ulcers especially appear to be doing much better. His toe ulcer on the right in regard to the great toe is better although not as good as the legs in my pinion. No fevers chills noted 07/02/18 on evaluation today patient appears to be doing much better in regard to his lower extremity ulcers. Unfortunately since I last  saw him he's had the distal portion of his right great toe if he dated it sounds as if this actually went downhill very quickly. I had only seen him a few days prior and the toe did not appear to be infected at that point subsequently became infected very rapidly and it was decided by the surgeon that the distal portion of the toe needed to be removed. The patient seems to be doing well in this regard he tells me. With that being said his lower extremities are doing better from the standpoint of the wounds although he is significantly swollen at this point. 07/09/18 on evaluation today patient appears to be doing better in regard to the wounds on his lower extremities. In fact everything is almost completely healed he is just a small area on the left posterior lower extremity that is open at this point. 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 With that being said he needs some kind of compression it does sound like he may not be wearing his compression, that is the wraps, during the entire time between when he's here visit to visit. Apparently his wife took the current one off because it began to "fall apart". 07/16/18 on evaluation today patient appears to be extremely swollen especially in regard to his left lower extremity unfortunately. He also has a new skin tear over the left lower extremity and there's a smaller area on the right lower extremity as well. Unfortunately this seems to be due in part to blistering and fluid buildup in his leg. He did get the reduction wraps that were ordered by the [facility] hospital for him to go to lymphedema clinic. With that being said his wounds on the legs have not healed to the point to where they would likely accept them as a patient lymphedema clinic currently. We need to try to  get this to heal. With that being said he's been taking his wraps off which is not doing him any favors at this point. In fact this is probably quite counterproductive compared to what needs to occur. We will likely need to increase to a four layer compression wrap  and continue to also utilize elevation and he has to keep the wraps on not take them off as he's been doing currently hasn't had a wrap on since Saturday. 07/23/18 on evaluation today patient appears to be doing much better in regard to his bilateral lower extremities. In fact his left lower extremity which was the largest is actually 15 cm smaller today compared to what it was last time he was here in our clinic. This is obviously good news after just one week. Nonetheless the differences he actually kept the wraps on during the entire week this time. That's not typical for him. I do believe he understands a little bit better now the severity of the situation and why it's important for him to keep these wraps on. 07/30/18 on evaluation today patient actually appears to be doing rather well in regard to his lower extremities. His legs are much smaller than they have been in the past and he actually has only one very small rudder superficial region remaining that is not closed on the left lateral/posterior lower extremity even this is almost completely close. I do believe likely next week he will be healed without any complications. I do think we need to continue the wraps however this seems to be beneficial for him. I also think it may be a good time for [place] to go ahead and see about getting the appointment with the lymphedema clinic which is supposed to be made for him in order to keep this moving along and hopefully get them into compression wraps that will in the end help him to remain healed. 08/06/18 on evaluation today patient actually appears to be doing very well in regard as bilateral lower extremities. In fact his wounds appear to  be completely healed at this time. 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 He is in the process of getting appointment with the lymphedema clinic we have made this referral were just waiting to hear back on the schedule time. We need to follow up on that today as well. 08/13/18 on evaluation today patient actually appears to be doing very well in regard to his bilateral lower extremities there are no open wounds at this point. We are gonna go ahead and see about ordering the Velcro compression wraps for him had a discussion with them about [place] doing it versus the [place] they feel like they can definitely afford going ahead and get the wraps themselves and they would prefer to try to avoid having to go to the lymphedema clinic if it all possible which I completely understand. As long as he has good compression I'm okay either way. 3/17//20 on evaluation today patient actually appears to be doing well in regard to his bilateral lower extremity ulcers. He has been tolerating the dressing changes without complication specifically the compression wraps. Overall is had no issues my fingers finding that I see at this point is that he is having trouble with constipation. He tells me he has not been able to go to the bathroom for about six days. He's taken to over-the-counter oral laxatives unfortunately this is not helping. He has not contacted his doctor. ADARIUS, TIGGES ([id number]) 08/27/18 on evaluation today patient appears to be doing fairly well in regard to his lower extremities at this point. There does not appear to be any new altars is swelling is very well controlled. We are still waiting for his Velcro compression wraps to arrive that  should be sometime in the next week is Artie sent the check for these. 09/03/18 on evaluation today patient appears to be doing excellent in regard to his bilateral lower extremities which he shows no signs of  wound openings in regard to this point. He does have his Velcro compression wraps which did arrive in the mail since I saw him last week. Overall he is doing excellent in my pinion. 09/13/18 on evaluation today patient appears to be doing very well currently in regard to the overall appearance of his bilateral lower extremities although he's a little bit more swollen than last time we saw him. At that point he been discharged without any open wounds. Nonetheless he has a small open wound on the posterior left lower extremity with some evidence of cellulitis noted as well. Fortunately I feel like he has made good progress overall with regard to his lower extremities from were things used to be. 09/20/18 on evaluation today patient actually appears to be doing much better. The erythematous lower extremity is improving wound itself which is still open appears to be doing much better as far as but appearance as well as pain is concerned overall very pleased in this regard. There's no signs of active infection at this time. 09/27/18 on evaluation today patient's wounds on the lower extremity actually appear to be doing fairly well at this time which is good news. There is no evidence of active infection currently and again is just as left lower extremity were there any wounds at this point anyway. I believe they may be completely healed but again I'm not 100% sure based on evaluation today. I think one more week of observation would likely be a good idea. 10/04/18 on evaluation today patient actually appears to be doing excellent in regard to the left lower extremity which actually appears to be completely healed as of today. Unfortunately he's been having issues with his right lower extremity have a new wound that has opened. Fortunately there's no evidence of active infection at this time which is good news. 10/10/18 upon evaluation today patient actually appears to be doing a little bit worse with the new  open area on his right posterior lower extremity. He's been tolerating the dressing changes without complication. Right now we been using his compression wraps although I think we may need to switch back to actually performing bilateral compression wraps are in the clinic. No fevers, chills, nausea, or vomiting noted at this time. 10/17/18 on evaluation today patient actually appears to be doing quite well in regard to his bilateral lower extremity ulcers. He has been tolerating the reps without complication although he would prefer not to rewrap his legs as of today. Fortunately there's no signs of active infection which is good news. No fevers, chills, nausea, or vomiting noted at this time. 10/24/18 on evaluation today patient appears to be doing very well in regard to his lower extremities. His right lower extremity is shown signs of healing and his left lower Trinity though not healed appears to be improving which is excellent news. Overall very pleased with how things seem to be progressing at this time. The patient is likewise happy to hear this. His Velcro compression wraps however have not been put on properly will gonna show his wife how to do that properly today. 10/31/18 on evaluation today patient appears to be doing more poorly in regard to his left lower extremity in particular although both lower extremities actually are showing some signs  of being worse than my previous evaluation. Unfortunately I'm just not sure that his compression stockings even with the use of the compression/lymphedema pumps seem to be controlling this well. Upon further questioning he tells me that he also is not able to lie flat in the bed due to his congestive heart failure and difficulty breathing. For that reason he sleeps in his recliner. To make matters worse his recliner also cannot even hold his legs up so instead of even being somewhat elevated they pretty much hang down to the floor. This is the way he  sleeps each night which is definitely counterproductive to everything else that were attempting to do from the standpoint of controlling his fluid. Nonetheless I think that he potentially could benefit from a hospital bed although this would be something that his primary care provider would likely have to order since anything that is order on our side has to be directly related to wound care and again the hospital bed is not necessarily a direct relation to although I think it does contribute to his overall wound status on his lower extremities. 11/07/18 on evaluation today patient appears to be doing better in regard to his bilateral lower extremity. He's been tolerating the dressing changes without complication. We did in the interim since I last saw him switch to just using extras orbiting new alginate and that seems to have done much better for him. I'm very pleased with the overall progress that is made. 11/14/18 on evaluation today patient appears to be redoing rather well in regard to his left lower extremity ulcers which are the only ones remaining at this point. Fortunately there's no signs of active infection at this time which is good news. Overall been EDWARDS, MCKELVIE ([id number]) very pleased with how things seem to be progressing currently. No fevers, chills, nausea, or vomiting noted at this time. 11/20/17 on evaluation today patient actually appears to be doing quite well in regard to his lower extremities on the left on the right he has several blisters that showed up although there's some question about whether or not he has had his broker compression wraps on like he was supposed to or not. Fortunately there's no signs of active infection at this time which is good news. Unfortunately though he is doing well from the standpoint of the left leg the right leg is not doing as well again this is when we did not wrap last week. 11/28/18 on evaluation today patient appears to be doing well  in regard to his bilateral lower extremities is left appears to be healed is right is not healed but is very close to being so. Overall very pleased with how things seem to be progressing. Patient is likewise happy that things are doing well. 12/09/18 on evaluation today patient actually appears to be completely healed which is excellent news. He actually seems to be doing well in regard to the swelling of the bilateral lower extremities which is also great news. Overall very pleased with how things seem to be progressing. Readmission: 03/18/2019 upon evaluation today patient presents for reevaluation concerning issues that he is having with his left lower extremity where he does have a wound noted upon inspection today. He also has a wound on the right second toe on the tip where it is apparently been rubbing in his shoe I did look at issues and you can actually see where this has been occurring as well. Fortunately there is no signs of infection with  regard to the toe necessarily although it is very swollen compared to normal that does have me somewhat concerned about the possibility of further evaluation for infection/osteomyelitis. I would recommend an x-ray to start with. Otherwise he states that it is been 1-2 weeks that he has had the draining in regard to left lower extremity he does not know how this happened he has been wearing his compression appropriately and I do see that as well today but nonetheless I do think that he needs to continue to be very cautious with regard to elevation as well. 03/25/2019 on evaluation today patient appears to be doing much better in regard to his lower extremity at this time. That is on the left. He did culture positive for Pseudomonas but the good news is he seems to be doing much better the gentamicin we applied topically seems to have done a great job for him. There is no signs of active infection at this time systemically and locally he is doing much  better. No fevers, chills, nausea, vomiting, or diarrhea. In regard to his right toe this seems to be doing much better there is very little pressure noted at this point I did clean up some of the callus today around the edges of the wound as well as the surface of the wound but to be honest he is progressing quite nicely. 10/30; the area on the left anterior lower tibia area is healed over. His edema control is adequate even though his use of the compression pumps seems very intermittent. He has a juxta lite stocking on the right leg and a think there is 1 for the left leg at home. His wife is putting these on. He has an area on the plantar right second toe which is a hammertoe they are trying to offload this 04/08/2019 on evaluation today patient appears to be doing well in regard to his lower extremities which is good news. We are seeing him today for his toe ulcer which is giving him a little bit more trouble but again still seems to be doing better at this point which is good news. He is going require some sharp debridement in regard to the toe today however. 04/15/2019 on evaluation today patient actually appears to be doing quite well with regard to his toe ulcer. I am very pleased with how things are doing in that regard. With regard to his lower extremity edema in general he tells me he is not been using the lymphedema pumps regularly although he does not use them. I think that he needs to use them more regularly he does have a small blister on the left lower extremity this is not open at this point but obviously it something that can get worse if he does not keep the compression going and the pumps going as well. 04/22/2019 on evaluation today patient appears to be doing well with regard to his toe ulcer. He has been tolerating the dressing changes without complication. This is measuring slightly smaller this week as compared to last week. Fortunately there is no signs of active infection  at this time. Patient History Information obtained from Patient. Family History Cancer - Paternal Grandparents, Kidney Disease - Siblings, Lung Disease - Mother, No family history of Diabetes, Heart Disease, Hereditary Spherocytosis, Hypertension, Seizures, Stroke, Thyroid Problems, Tuberculosis. NOSSON, WENDER ([id number]) Social History Never smoker, Marital Status - Married, Alcohol Use - Never, Drug Use - No History, Caffeine Use - Never. Medical History Eyes Denies history  of Cataracts, Glaucoma, Optic Neuritis Ear/Nose/Mouth/Throat Denies history of Chronic sinus problems/congestion, Middle ear problems Hematologic/Lymphatic Patient has history of Anemia - aplastic anemia, Lymphedema Denies history of Hemophilia, Human Immunodeficiency Virus, Sickle Cell Disease Respiratory Patient has history of Chronic Obstructive Pulmonary Disease (COPD), Sleep Apnea Denies history of Aspiration, Asthma, Pneumothorax, Tuberculosis Cardiovascular Patient has history of Congestive Heart Failure, Coronary Artery Disease, Hypertension, Peripheral Venous Disease Denies history of Angina, Arrhythmia, Deep Vein Thrombosis, Hypotension, Myocardial Infarction, Peripheral Arterial Disease, Phlebitis, Vasculitis Gastrointestinal Denies history of Cirrhosis , Colitis, Crohn s, Hepatitis A, Hepatitis B, Hepatitis C Endocrine Patient has history of Type II Diabetes Denies history of Type I Diabetes Genitourinary Denies history of End Stage Renal Disease Immunological Denies history of Lupus Erythematosus, Raynaud s, Scleroderma Integumentary (Skin) Denies history of History of Burn, History of pressure wounds Musculoskeletal Patient has history of Osteoarthritis Denies history of Gout, Rheumatoid Arthritis, Osteomyelitis Neurologic Patient has history of Neuropathy Denies history of Dementia, Quadriplegia, Paraplegia, Seizure Disorder Oncologic Patient has history of Received Chemotherapy - last  dose 2006 Denies history of Received Radiation Psychiatric Denies history of Anorexia/bulimia, Confinement Anxiety Hospitalization/Surgery History - bronchitis. Medical And Surgical History Notes Respiratory home O2 at night as needed Cardiovascular varicose veins Neurologic CVA - 1992 Review of Systems (ROS) Constitutional Symptoms (General Health) Denies complaints or symptoms of Fatigue, Fever, Chills, Marked Weight Change. Respiratory Denies complaints or symptoms of Chronic or frequent coughs, Shortness of Breath. Cardiovascular Denies complaints or symptoms of Chest pain, LE edema. Psychiatric ALDINE, GRAINGER ([id number]) Denies complaints or symptoms of Anxiety, Claustrophobia. Objective Constitutional Well-nourished and well-hydrated in no acute distress. Vitals Time Taken: 12:35 PM, Height: 66 in, Weight: 297 lbs, BMI: 47.9, Temperature: 98.6 F, Pulse: 67 bpm, Respiratory Rate: 18 breaths/min, Blood Pressure: 145/68 mmHg. Respiratory normal breathing without difficulty. Psychiatric this patient is able to make decisions and demonstrates good insight into disease process. Alert and Oriented x 3. pleasant and cooperative. General Notes: Patient's wound bed showed signs of good granulation at this point there does not appear to be any evidence of active infection which is good news. Fortunately there is no signs of inflammation at this time either. I did have to remove some callus today post debridement the wound bed appears to be doing much better and is not even as deep as it appeared upon initial inspection. Integumentary (Hair, Skin) Wound #24 status is Open. Original cause of wound was Gradually Appeared. The wound is located on the Right,Distal Toe Second. The wound measures 0.2cm length x 0.2cm width x 0.2cm depth; 0.031cm^2 area and 0.006cm^3 volume. There is Fat Layer (Subcutaneous Tissue) Exposed exposed. There is no tunneling or undermining noted. There is a  medium amount of serous drainage noted. The wound margin is flat and intact. There is medium (34-66%) red, pink granulation within the wound bed. There is a medium (34-66%) amount of necrotic tissue within the wound bed including Adherent Slough. 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 There was a Excisional Skin/Subcutaneous Tissue Debridement with a total area of 0.04  sq cm performed by STONE III, HOYT E., PA-C. With the following instrument(s): Curette to remove Viable and Non-Viable tissue/material. Material removed includes Callus, Subcutaneous Tissue, and Slough after achieving pain control using Lidocaine 4% Topical Solution. No specimens were taken. A time out was conducted at 13:11, prior to the start of the procedure. A Minimum amount of bleeding was controlled with Pressure. The procedure was tolerated well with a pain level of 0 throughout and a pain level of 0 following the procedure. Post Debridement Measurements: 0.2cm length x 0.2cm width x 0.1cm depth; 0.003cm^3 volume. Character of Wound/Ulcer Post Debridement is improved. Severity of Tissue Post Debridement is: Fat layer exposed. Post procedure Diagnosis Wound #24: Same as Pre-Procedure Plan Wound Cleansing: Wound #24 Right,Distal Toe Second: Dial antibacterial soap, wash wounds, rinse and pat dry prior to dressing wounds May Shower, gently pat wound dry prior to applying new dressing. Primary Wound Dressing: Wound #24  Right,Distal Toe Second: Silver Collagen Secondary Dressing: Wound #24 Right,Distal Toe Second: Foam - with coverlet or bandaid Dressing Change Frequency: Wound #24 Right,Distal Toe Second: Change dressing every other day. Follow-up Appointments: Wound #24 Right,Distal Toe Second: Return Appointment in 2 weeks. Edema Control: Patient to wear own Velcro compression garment. Elevate legs to the level of the heart and pump ankles as often as possible Compression Pump: Use compression pump on left lower extremity for 60 minutes, twice daily. 1 I would recommend that we continue with the current wound care measures as the patient is doing excellent with this and that includes the silver collagen dressing which Bethena Roys has been changing for him doing an excellent job. 2. I would recommend as well that we cover this with foam and then a Band-Aid over top of that. 3. I still recommend the patient continue to keep pressure off of the area as much as possible. We will see patient back for reevaluation in 2 weeks here in the clinic. If anything worsens or changes patient will contact our office for additional recommendations. Electronic Signature(s) JAHSON, EMANUELE ([id number]) Signed: 04/22/2019 1:17:48 PM By: Worthy Keeler PA-C Entered By: Worthy Keeler on 04/22/2019 13:17:47 Adam Merritt ([id number]) -------------------------------------------------------------------------------- ROS/PFSH Details Patient Name: Adam Merritt Date of Service: 04/22/2019 12:45 PM Medical Record Number: [id number] Patient Account Number: [id number] Date of Birth/Sex: 19-Dec-1945 (73 y.o. M) Treating RN: Montey Hora Primary Care Provider: [name] Other Clinician: Referring Provider: [name] Treating Provider/Extender: Melburn Hake, [name]: 5 Information Obtained From Patient Constitutional Symptoms (General Health) Complaints and Symptoms: Negative for: Fatigue; Fever; Chills;  Marked Weight Change Respiratory Complaints and Symptoms: Negative for: Chronic or frequent coughs; Shortness of Breath Medical History: Positive for: Chronic Obstructive Pulmonary Disease (COPD); Sleep Apnea Negative for: Aspiration; Asthma; Pneumothorax; Tuberculosis Past Medical History Notes: home O2 at night as needed Cardiovascular Complaints and Symptoms: Negative for: Chest pain; LE edema Medical History: Positive for: Congestive Heart Failure; Coronary Artery Disease; Hypertension; Peripheral Venous Disease Negative for: Angina; Arrhythmia; Deep Vein Thrombosis; Hypotension; Myocardial Infarction; Peripheral Arterial Disease; Phlebitis; Vasculitis Past Medical History Notes: varicose veins Psychiatric Complaints and Symptoms: Negative for: Anxiety; Claustrophobia Medical History: Negative for: Anorexia/bulimia; Confinement Anxiety Eyes Medical History: Negative for: Cataracts; Glaucoma; Optic Neuritis Ear/Nose/Mouth/Throat Medical History: Negative for: Chronic sinus problems/congestion; Middle ear problems Hematologic/Lymphatic BAKER, MORONTA ([id number]) Medical History: Positive for: Anemia - aplastic anemia; Lymphedema Negative for: Hemophilia; Human Immunodeficiency Virus; Sickle Cell Disease Gastrointestinal Medical History: Negative for: Cirrhosis ; Colitis; Crohnos; Hepatitis A; Hepatitis  B; Hepatitis C Endocrine Medical History: Positive for: Type II Diabetes Negative for: Type I Diabetes Time with diabetes: since 2006 Treated with: Insulin Blood sugar tested every day: Yes Tested : Blood sugar testing results: Breakfast: 209 Genitourinary Medical History: Negative for: End Stage Renal Disease Immunological Medical History: Negative for: Lupus Erythematosus; Raynaudos; Scleroderma Integumentary (Skin) Medical History: Negative for: History of Burn; History of pressure wounds Musculoskeletal Medical History: Positive for:  Osteoarthritis Negative for: Gout; Rheumatoid Arthritis; Osteomyelitis Neurologic Medical History: Positive for: Neuropathy Negative for: Dementia; Quadriplegia; Paraplegia; Seizure Disorder Past Medical History Notes: CVA - 1992 Oncologic Medical History: Positive for: Received Chemotherapy - last dose 2006 Negative for: Received Radiation Immunizations Pneumococcal Vaccine: Received Pneumococcal Vaccination: Yes LEMARIO, CHAIKIN ([id number]) Implantable Devices No devices added Hospitalization / Surgery History Type of Hospitalization/Surgery bronchitis Family and Social History Cancer: Yes - Paternal Grandparents; Diabetes: No; Heart Disease: No; Hereditary Spherocytosis: No; Hypertension: No; Kidney Disease: Yes - Siblings; Lung Disease: Yes - Mother; Seizures: No; Stroke: No; Thyroid Problems: No; Tuberculosis: No; Never smoker; Marital Status - Married; Alcohol Use: Never; Drug Use: No History; Caffeine Use: Never; Financial Concerns: No; Food, Clothing or Shelter Needs: No; Support System Lacking: No; Transportation Concerns: No Physician Affirmation I have reviewed and agree with the above information. Electronic Signature(s) Signed: 04/22/2019 4:34:50 PM By: Montey Hora Signed: 04/22/2019 6:03:25 PM By: Worthy Keeler PA-C Entered By: Worthy Keeler on 04/22/2019 13:16:32 Adam Merritt ([id number]) -------------------------------------------------------------------------------- SuperBill Details Patient Name: Adam Merritt Date of Service: 04/22/2019 Medical Record Number: [id number] Patient Account Number: [id number] Date of Birth/Sex: 1946-03-08 (73 y.o. M) Treating RN: Montey Hora Primary Care Provider: [name] Other Clinician: Referring Provider: [name] Treating Provider/Extender: Melburn Hake, [name]: 5 Diagnosis Coding ICD-10 Codes Code Description I89.0 Lymphedema, not elsewhere classified E11.621 Type 2 diabetes mellitus  with foot ulcer L97.512 Non-pressure chronic ulcer of other part of right foot with fat layer exposed L97.822 Non-pressure chronic ulcer of other part of left lower leg with fat layer exposed E66.09 Other obesity due to excess calories Facility Procedures CPT4 Code: 01410301 Description: 31438 - DEB SUBQ TISSUE 20 SQ CM/< ICD-10 Diagnosis Description L97.512 Non-pressure chronic ulcer of other part of right foot with fat Modifier: layer exposed Quantity: 1 Physician Procedures CPT4 Code: [id number] Description: 11042 - WC PHYS SUBQ TISS 20 SQ CM ICD-10 Diagnosis Description L97.512 Non-pressure chronic ulcer of other part of right foot with fat Modifier: layer exposed Quantity: 1 Electronic Signature(s) Signed: 04/22/2019 1:17:56 PM By: Worthy Keeler PA-C Entered By: Worthy Keeler on 04/22/2019 13:17:55

## 2019-04-29 ENCOUNTER — Ambulatory Visit: Payer: Medicare Other | Admitting: Physician Assistant

## 2019-04-30 ENCOUNTER — Telehealth: Payer: Self-pay

## 2019-04-30 NOTE — Telephone Encounter (Signed)
CONFIRMED AND SCREENED FOR 05-06-19 OV.

## 2019-05-05 ENCOUNTER — Telehealth: Payer: Self-pay

## 2019-05-05 NOTE — Telephone Encounter (Signed)
CONFIRMED AS A VIRTUAL APPOINTMENT FOR 05-06-19.

## 2019-05-06 ENCOUNTER — Encounter: Payer: Self-pay | Admitting: Nurse Practitioner

## 2019-05-06 ENCOUNTER — Other Ambulatory Visit: Payer: Self-pay

## 2019-05-06 ENCOUNTER — Encounter: Payer: Medicare Other | Attending: Physician Assistant | Admitting: Physician Assistant

## 2019-05-06 ENCOUNTER — Ambulatory Visit (INDEPENDENT_AMBULATORY_CARE_PROVIDER_SITE_OTHER): Payer: Medicare Other | Admitting: Nurse Practitioner

## 2019-05-06 DIAGNOSIS — I110 Hypertensive heart disease with heart failure: Secondary | ICD-10-CM | POA: Insufficient documentation

## 2019-05-06 DIAGNOSIS — G4730 Sleep apnea, unspecified: Secondary | ICD-10-CM | POA: Diagnosis not present

## 2019-05-06 DIAGNOSIS — J449 Chronic obstructive pulmonary disease, unspecified: Secondary | ICD-10-CM | POA: Insufficient documentation

## 2019-05-06 DIAGNOSIS — Z9221 Personal history of antineoplastic chemotherapy: Secondary | ICD-10-CM | POA: Insufficient documentation

## 2019-05-06 DIAGNOSIS — Z794 Long term (current) use of insulin: Secondary | ICD-10-CM

## 2019-05-06 DIAGNOSIS — E6609 Other obesity due to excess calories: Secondary | ICD-10-CM | POA: Diagnosis not present

## 2019-05-06 DIAGNOSIS — Z0001 Encounter for general adult medical examination with abnormal findings: Secondary | ICD-10-CM | POA: Diagnosis not present

## 2019-05-06 DIAGNOSIS — E11621 Type 2 diabetes mellitus with foot ulcer: Secondary | ICD-10-CM | POA: Diagnosis not present

## 2019-05-06 DIAGNOSIS — M1990 Unspecified osteoarthritis, unspecified site: Secondary | ICD-10-CM | POA: Insufficient documentation

## 2019-05-06 DIAGNOSIS — G4733 Obstructive sleep apnea (adult) (pediatric): Secondary | ICD-10-CM

## 2019-05-06 DIAGNOSIS — L97512 Non-pressure chronic ulcer of other part of right foot with fat layer exposed: Secondary | ICD-10-CM | POA: Insufficient documentation

## 2019-05-06 DIAGNOSIS — I10 Essential (primary) hypertension: Secondary | ICD-10-CM | POA: Diagnosis not present

## 2019-05-06 DIAGNOSIS — E1140 Type 2 diabetes mellitus with diabetic neuropathy, unspecified: Secondary | ICD-10-CM | POA: Insufficient documentation

## 2019-05-06 DIAGNOSIS — I872 Venous insufficiency (chronic) (peripheral): Secondary | ICD-10-CM | POA: Insufficient documentation

## 2019-05-06 DIAGNOSIS — I509 Heart failure, unspecified: Secondary | ICD-10-CM | POA: Insufficient documentation

## 2019-05-06 DIAGNOSIS — I890 Lymphedema, not elsewhere classified: Secondary | ICD-10-CM | POA: Diagnosis not present

## 2019-05-06 DIAGNOSIS — Z8673 Personal history of transient ischemic attack (TIA), and cerebral infarction without residual deficits: Secondary | ICD-10-CM | POA: Diagnosis not present

## 2019-05-06 DIAGNOSIS — E1165 Type 2 diabetes mellitus with hyperglycemia: Secondary | ICD-10-CM

## 2019-05-06 DIAGNOSIS — L97822 Non-pressure chronic ulcer of other part of left lower leg with fat layer exposed: Secondary | ICD-10-CM | POA: Insufficient documentation

## 2019-05-06 NOTE — Progress Notes (Signed)
[facility] ([facility] [place], [place] [address]  Internal MEDICINE  Telephone Visit  Patient Name: Adam Merritt  [id number]  [id number]  Date of Service: 05/06/2019  I connected with the patient at 11:24am by telephone and verified the patients identity using two identifiers.   I discussed the limitations, risks, security and privacy concerns of performing an evaluation and management service by telephone and the availability of in person appointments. I also discussed with the patient that there may be a patient responsible charge related to the service.  The patient expressed understanding and agrees to proceed.    Chief Complaint  Patient presents with  . Telephone Assessment  . Telephone Screen  . Annual Exam    The patient has been contacted via telephone for follow up visit due to concerns for spread of novel coronavirus. He states that he is feeling well. Does have some foot and leg pain, but gabapentin has been doing well controlling this. Still does not feel the need for stronger pain medication at this time. States that he has not been using his lymphedema pump as much as he should. Does see his wound care specialist later today. He states that his blood sugars have been doing well. Unable to test right now as he has been having trouble with the meter and lancets. Does need to have a new meter. He states that his breathing has been doing well. Has not had flares of bronchitis recently.       Current Medication: Outpatient Encounter Medications as of 05/06/2019  Medication Sig  . albuterol (PROVENTIL HFA;VENTOLIN HFA) 108 (90 Base) MCG/ACT inhaler INHALE 1 PUFF INTO THE LUNGS EVERY 6 HOURS AS NEEDED FOR WHEEZING OR SHORTNESS OF BREATH (Patient taking differently: Inhale 1 puff into the lungs every 6 (six) hours as needed for wheezing or shortness of breath. )  . aspirin 325 MG EC tablet Take 325 mg by mouth daily.  Marland Kitchen atorvastatin (LIPITOR) 40 MG tablet Take 40  mg by mouth at bedtime.   . carvedilol (COREG) 25 MG tablet Take 25 mg by mouth 2 (two) times daily with a meal.  . docusate sodium (COLACE) 50 MG capsule Take 50 mg by mouth 2 (two) times daily.  . febuxostat (ULORIC) 40 MG tablet Take 1 tablet (40 mg total) by mouth daily.  . furosemide (LASIX) 40 MG tablet Take 1 tablet (40 mg total) by mouth daily.  Marland Kitchen gabapentin (NEURONTIN) 300 MG capsule Take 2 cap in the morning, 1 cap in the evening and 2 cap at night.  . hydrALAZINE (APRESOLINE) 25 MG tablet   . indapamide (LOZOL) 2.5 MG tablet Take 1 tablet (2.5 mg total) by mouth daily.  . Insulin Glargine (BASAGLAR KWIKPEN) 100 UNIT/ML SOPN INJECT 30 TO 36 UNITS UNDER THE SKIN EVERY DAY  . Insulin Pen Needle (BD PEN NEEDLE NANO U/F) 32G X 4 MM MISC Use as directed with insulin DX e11.65  . ipratropium-albuterol (DUONEB) 0.5-2.5 (3) MG/3ML SOLN Take 3 mLs by nebulization every 4 (four) hours as needed (for shortness of breath).  . metolazone (ZAROXOLYN) 2.5 MG tablet   . Multiple Vitamin (MULTIVITAMIN WITH MINERALS) TABS tablet Take 1 tablet by mouth daily.  . ondansetron (ZOFRAN) 4 MG tablet Take 1 tablet (4 mg total) by mouth every 8 (eight) hours as needed.  [name] 06T MISC Use twice daily diag e11.65  . pentoxifylline (TRENTAL) 400 MG CR tablet TAKE 1 TABLET BY MOUTH THREE TIMES DAILY  FOR CLAUDICATION  . potassium chloride SA (KLOR-CON) 20 MEQ tablet Take 1 tablet (20 mEq total) by mouth 2 (two) times daily.  . prazosin (MINIPRESS) 2 MG capsule Take 2 mg by mouth at bedtime.   . sennosides-docusate sodium (SENOKOT-S) 8.6-50 MG tablet Take 1-2 tablets by mouth 2 (two) times daily.  . sertraline (ZOLOFT) 100 MG tablet   . traZODone (DESYREL) 50 MG tablet Take 50 mg by mouth at bedtime.  Marland Kitchen VICTOZA 18 MG/3ML SOPN INJECT 1.8MG  INTO THE SKIN DAILY   No facility-administered encounter medications on file as of 05/06/2019.     Surgical History: Past Surgical History:  Procedure  Laterality Date  . 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 CHOLECYSTECTOMY    . CORONARY ARTERY BYPASS GRAFT      Medical History: Past Medical History:  Diagnosis Date  . Anemia   . CAD (coronary artery disease)   . Chest pain   . Coronary artery disease   . Diabetes mellitus without complication ([place])   . Diastolic CHF ([place])   . Esophageal reflux   . Herpes zoster without mention of complication   . Hyperlipidemia   . Hypertension   . Insomnia   . Lumbago   . Lymphedema   . Neuropathy in diabetes ([place])   . Obstructive chronic bronchitis without exacerbation ([place])   . Other malaise and fatigue   . Stroke ([place])   . Varicose veins     Family History: Family History  Problem Relation Age of Onset  . Diabetes Mother   . Hyperlipidemia Mother   . Hypertension Mother   . Diabetes Father   . Hyperlipidemia Father   . Hypertension Father   . CAD Father     Social History   Socioeconomic History  . Marital status: Married    Spouse name: Not on file  . Number of children: Not on file  . Years of education: Not on file  . Highest education level: Not on file  Occupational History  . Occupation: retired  Scientific laboratory technician  . Financial resource strain: Not hard at all  . Food insecurity    Worry: Never true    Inability: Never true  . Transportation needs    Medical: No    Non-medical: No  Tobacco Use  . Smoking status: Never Smoker  . Smokeless tobacco: Never Used  Substance and Sexual Activity  . Alcohol use: No    Comment: quit alcohol 30 years ago  . Drug use: No  . Sexual activity: Not Currently  Lifestyle  . Physical activity    Days per week: 0 days    Minutes per session: 0 min  . Stress: Only a little  Relationships  . Social connections    Talks on phone: More than three times a week    Gets together: More than three times a week     Attends religious service: More than 4 times per year    Active member of club or organization: No    Attends meetings of clubs or organizations: Never    Relationship status: Married  . Intimate partner violence    Fear of current or ex partner: No    Emotionally abused: No    Physically abused: No    Forced sexual activity: No  Other Topics Concern  . Not on file  Social History Narrative  Lives at home with wife, uses a wheelchair now      Review of Systems  Constitutional: Negative for activity change and fatigue.  HENT: Negative for congestion and postnasal drip.   Respiratory: Positive for shortness of breath. Negative for cough and chest tightness.   Cardiovascular: Positive for leg swelling. Negative for chest pain and palpitations.       Leg swelling has improved since his last visit .  Gastrointestinal: Negative for constipation, diarrhea, nausea and vomiting.  Endocrine: Negative for cold intolerance, heat intolerance, polydipsia and polyuria.       Blood sugars doing well   Musculoskeletal: Positive for arthralgias, back pain and myalgias.  Skin: Positive for rash and wound.       Patient scheduled to see his wound care specialist later today.   Allergic/Immunologic: Positive for environmental allergies.  Neurological: Negative for dizziness, light-headedness and headaches.  Hematological: Negative for adenopathy.  Psychiatric/Behavioral: Positive for dysphoric mood. Negative for agitation. The patient is nervous/anxious.        Improved. [facility].    Vital Signs: There were no vitals taken for this visit.   Observation/Objective:  The patient is alert and oriented. He is pleasant and answering all questions appropriately. Breathing is non-labored. He is in no acute distress.   Depression screen [facility] 2/9 04/10/2019 12/17/2018 11/08/2018 10/14/2018 09/26/2018  Decreased Interest 0 0 0 0 0  Down, Depressed, Hopeless 0 0 0 0 0  PHQ - 2  Score 0 0 0 0 0  Some recent data might be hidden    Functional Status Survey: Is the patient deaf or have difficulty hearing?: No Does the patient have difficulty seeing, even when wearing glasses/contacts?: No Does the patient have difficulty concentrating, remembering, or making decisions?: No Does the patient have difficulty walking or climbing stairs?: No Does the patient have difficulty dressing or bathing?: No Does the patient have difficulty doing errands alone such as visiting a doctor's office or shopping?: No  MMSE - [place] Exam 05/06/2019 05/01/2018  Orientation to time 5 5  Orientation to Place 5 5  Registration 3 3  Attention/ Calculation 5 5  Recall 3 3  Language- name 2 objects 2 2  Language- repeat 1 1  Language- follow 3 step command 3 3  Language- read & follow direction 1 1  Write a sentence 1 1  Copy design 1 1  Total score 30 30    Fall Risk  05/06/2019 04/10/2019 04/10/2019 02/24/2019 12/17/2018  Falls in the past year? 0 1 1 0 1  Number falls in past yr: - - 1 0 0  Injury with Fall? - 1 0 0 0  Comment - - - - -  Risk for fall due to : - History of fall(s) - - -  Follow up - Falls evaluation completed - - -     Assessment/Plan:  1. Encounter for general adult medical examination with abnormal findings Annual health maintenance exam today.   2. Type 2 diabetes mellitus with hyperglycemia, with long-term current use of insulin ([place]) Most recent check of HgbA1c 6.3 today. Continue all diabetic medication as prescribed. Recheck HgbA1c at next visit.   3. Chronic obstructive pulmonary disease, unspecified COPD type ([place]) Has been stable on current medication. No changes made today.   4. Essential hypertension Stable. Continue bp medication as prescribed   5. Lymphedema of both lower extremities Encouraged him to use lymphedema pump as instructed. Follow up  with vein and vascular as scheduled.   6. OSA (obstructive sleep apnea) Continue  regular visits with Dr. Devona Konig for management of CPAP.  General Counseling: Aeron verbalizes understanding of the findings of today's phone visit and agrees with plan of treatment. I have discussed any further diagnostic evaluation that may be needed or ordered today. We also reviewed his medications today. he has been encouraged to call the office with any questions or concerns that should arise related to todays visit.   Diabetes Counseling:  1. Addition of ACE inh/ ARB'S for nephroprotection. Microalbumin is updated  2. Diabetic foot care, prevention of complications. Podiatry consult 3. Exercise and lose weight.  4. Diabetic eye examination, Diabetic eye exam is updated  5. Monitor blood sugar closlely. nutrition counseling.  6. Sign and symptoms of hypoglycemia including shaking sweating,confusion and headaches.  This patient was seen by [name] FNP Collaboration with Dr [name] as a part of collaborative care agreement   Time spent: 73 Minutes    Dr [name] Internal medicine

## 2019-05-07 NOTE — Progress Notes (Signed)
ANIS, DEGIDIO ([id number]) Visit Report for 05/06/2019 Arrival Information Details Patient Name: Adam Merritt, Adam Merritt Date of Service: 05/06/2019 1:00 PM Medical Record Number: [id number] Patient Account Number: [id number] Date of Birth/Sex: February 04, 1946 (73 y.o. M) Treating RN: Army Melia Primary Care Kaly Mcquary: [name] Other Clinician: Referring Iver Miklas: [name] Treating Shelina Luo/Extender: Melburn Hake, [name]: 7 Visit Information History Since Last Visit Added or deleted any medications: No Patient Arrived: [place] Any new allergies or adverse reactions: No Arrival Time: 13:07 Had a fall or experienced change in No Accompanied By: wife activities of daily living that may affect Transfer Assistance: None risk of falls: Patient Identification Verified: Yes Signs or symptoms of abuse/neglect since last visito No Patient Has Alerts: Yes Hospitalized since last visit: No Patient Alerts: Patient on Blood Thinner Has Dressing in Place as Prescribed: Yes DMII Pain Present Now: No aspirin 325 ABI 10/22/17 L 1.15 R .98 TBI L .66 R .61 Electronic Signature(s) Signed: 05/06/2019 3:58:31 PM By: Army Melia Entered By: Army Melia on 05/06/2019 13:07:36 Adam Merritt ([id number]) -------------------------------------------------------------------------------- Clinic Level of Care Assessment Details Patient Name: Adam Merritt Date of Service: 05/06/2019 1:00 PM Medical Record Number: [id number] Patient Account Number: [id number] Date of Birth/Sex: [birth year] (73 y.o. M) Treating RN: Montey Hora Primary Care Magalene Mclear: [name] Other Clinician: Referring Cassidy Tabet: [name] Treating Jiaire Rosebrook/Extender: Melburn Hake, [name]: 7 Clinic Level of Care Assessment Items TOOL 4 Quantity Score []  - Use when only an EandM is performed on FOLLOW-UP visit 0 ASSESSMENTS - Nursing Assessment / Reassessment X - Reassessment of Co-morbidities (includes updates  in patient status) 1 10 X- 1 5 Reassessment of Adherence to Treatment Plan ASSESSMENTS - Wound and Skin Assessment / Reassessment X - Simple Wound Assessment / Reassessment - one wound 1 5 []  - 0 Complex Wound Assessment / Reassessment - multiple wounds []  - 0 Dermatologic / Skin Assessment (not related to wound area) ASSESSMENTS - Focused Assessment []  - Circumferential Edema Measurements - multi extremities 0 []  - 0 Nutritional Assessment / Counseling / Intervention X- 1 5 Lower Extremity Assessment (monofilament, tuning fork, pulses) []  - 0 Peripheral Arterial Disease Assessment (using hand held doppler) ASSESSMENTS - Ostomy and/or Continence Assessment and Care []  - Incontinence Assessment and Management 0 []  - 0 Ostomy Care Assessment and Management (repouching, etc.) PROCESS - Coordination of Care X - Simple Patient / Family Education for ongoing care 1 15 []  - 0 Complex (extensive) Patient / Family Education for ongoing care X- 1 10 Staff obtains Programmer, systems, Records, Test Results / Process Orders []  - 0 Staff telephones HHA, Nursing Homes / Clarify orders / etc []  - 0 Routine Transfer to another Facility (non-emergent condition) []  - 0 Routine Hospital Admission (non-emergent condition) []  - 0 New Admissions / Biomedical engineer / Ordering NPWT, Apligraf, etc. []  - 0 Emergency Hospital Admission (emergent condition) X- 1 10 Simple Discharge Coordination Adam Merritt, Adam Merritt ([id number]) []  - 0 Complex (extensive) Discharge Coordination PROCESS - Special Needs []  - Pediatric / Minor Patient Management 0 []  - 0 Isolation Patient Management []  - 0 Hearing / Language / Visual special needs []  - 0 Assessment of Community assistance (transportation, D/C planning, etc.) []  - 0 Additional assistance / Altered mentation []  - 0 Support Surface(s) Assessment (bed, cushion, seat, etc.) INTERVENTIONS - Wound Cleansing / Measurement X - Simple Wound Cleansing - one wound  1 5 []  - 0 Complex Wound Cleansing - multiple wounds X- 1 5 Wound Imaging (  photographs - any number of wounds) []  - 0 Wound Tracing (instead of photographs) X- 1 5 Simple Wound Measurement - one wound []  - 0 Complex Wound Measurement - multiple wounds INTERVENTIONS - Wound Dressings []  - Small Wound Dressing one or multiple wounds 0 []  - 0 Medium Wound Dressing one or multiple wounds []  - 0 Large Wound Dressing one or multiple wounds []  - 0 Application of Medications - topical []  - 0 Application of Medications - injection INTERVENTIONS - Miscellaneous []  - External ear exam 0 []  - 0 Specimen Collection (cultures, biopsies, blood, body fluids, etc.) []  - 0 Specimen(s) / Culture(s) sent or taken to Lab for analysis []  - 0 Patient Transfer (multiple staff / Civil Service fast streamer / Similar devices) []  - 0 Simple Staple / Suture removal (25 or less) []  - 0 Complex Staple / Suture removal (26 or more) []  - 0 Hypo / Hyperglycemic Management (close monitor of Blood Glucose) []  - 0 Ankle / Brachial Index (ABI) - do not check if billed separately X- 1 5 Vital Signs Adam Merritt, Adam Merritt ([id number]) Has the patient been seen at the hospital within the last three years: Yes Total Score: 80 Level Of Care: New/Established - Level 3 Electronic Signature(s) Signed: 05/06/2019 4:29:10 PM By: Montey Hora Entered By: Montey Hora on 05/06/2019 14:02:34 Adam Merritt ([id number]) -------------------------------------------------------------------------------- Encounter Discharge Information Details Patient Name: Adam Merritt Date of Service: 05/06/2019 1:00 PM Medical Record Number: [id number] Patient Account Number: [id number] Date of Birth/Sex: Sep 16, 1945 (73 y.o. M) Treating RN: Montey Hora Primary Care Catherine Cubero: [name] Other Clinician: Referring Kahlen Boyde: [name] Treating Arath Kaigler/Extender: Melburn Hake, [name]: 7 Encounter Discharge Information  Items Discharge Condition: Stable Ambulatory Status: Walker Discharge Destination: Home Transportation: Private Auto Accompanied By: spouse Schedule Follow-up Appointment: No Clinical Summary of Care: Electronic Signature(s) Signed: 05/06/2019 4:29:10 PM By: Montey Hora Entered By: Montey Hora on 05/06/2019 14:03:24 Adam Merritt ([id number]) -------------------------------------------------------------------------------- Lower Extremity Assessment Details Patient Name: Adam Merritt Date of Service: 05/06/2019 1:00 PM Medical Record Number: [id number] Patient Account Number: [id number] Date of Birth/Sex: Oct 14, 1945 (73 y.o. M) Treating RN: Army Melia Primary Care Giovonnie Trettel: [name] Other Clinician: Referring Nation Cradle: [name] Treating Lido Maske/Extender: STONE III, [name]: 7 Edema Assessment Assessed: [Left: No] [Right: No] Edema: [Left: N] [Right: o] Vascular Assessment Pulses: Dorsalis Pedis Palpable: [Right:Yes] Electronic Signature(s) Signed: 05/06/2019 3:58:31 PM By: Army Melia Entered By: Army Melia on 05/06/2019 13:09:06 Adam Merritt ([id number]) -------------------------------------------------------------------------------- [place] Details Patient Name: Adam Merritt Date of Service: 05/06/2019 1:00 PM Medical Record Number: [id number] Patient Account Number: [id number] Date of Birth/Sex: 1945/10/24 (73 y.o. M) Treating RN: Montey Hora Primary Care Nyasiah Moffet: [name] Other Clinician: Referring Elsa Ploch: [name] Treating Zola Runion/Extender: Melburn Hake, [name]: 7 Active Inactive Electronic Signature(s) Signed: 05/06/2019 4:29:10 PM By: Montey Hora Entered By: Montey Hora on 05/06/2019 13:46:41 Adam Merritt ([id number]) -------------------------------------------------------------------------------- Pain Assessment Details Patient Name: Adam Merritt Date of Service:  05/06/2019 1:00 PM Medical Record Number: [id number] Patient Account Number: [id number] Date of Birth/Sex: [birth year] (73 y.o. M) Treating RN: Army Melia Primary Care Fama Muenchow: [name] Other Clinician: Referring Cristol Engdahl: [name] Treating Anay Walter/Extender: Melburn Hake, [name]: 7 Active Problems Location of Pain Severity and Description of Pain Patient Has Paino No Site Locations Pain Management and Medication Current Pain Management: Electronic Signature(s) Signed: 05/06/2019 3:58:31 PM By: Army Melia Entered By: Army Melia on 05/06/2019 13:07:45 Adam Loh  Viona Merritt ([id number]) -------------------------------------------------------------------------------- Patient/Caregiver Education Details Patient Name: Adam Merritt, Adam Merritt Date of Service: 05/06/2019 1:00 PM Medical Record Number: [id number] Patient Account Number: [id number] Date of Birth/Gender: 06-03-1946 (73 y.o. M) Treating RN: Montey Hora Primary Care Physician: [name] Other Clinician: Referring Physician: [name] Treating Physician/Extender: Sharalyn Ink in Treatment: 7 Education Assessment Education Provided To: Patient and Caregiver Education Topics Provided Basic Hygiene: Handouts: Other: care of newly healed wound site Methods: Explain/Verbal Responses: State content correctly Electronic Signature(s) Signed: 05/06/2019 4:29:10 PM By: Montey Hora Entered By: Montey Hora on 05/06/2019 14:03:05 Adam Merritt ([id number]) -------------------------------------------------------------------------------- Wound Assessment Details Patient Name: Adam Merritt Date of Service: 05/06/2019 1:00 PM Medical Record Number: [id number] Patient Account Number: [id number] Date of Birth/Sex: 04-03-1946 (73 y.o. M) Treating RN: Montey Hora Primary Care Keevan Wolz: [name] Other Clinician: Referring Mylie Mccurley: [name] Treating Curley Fayette/Extender: Melburn Hake, [name] in  Treatment: 7 Wound Status Wound Number: 24 Primary Diabetic Wound/Ulcer of the Lower Extremity Etiology: Wound Location: Right, Distal Toe Second Wound Healed - Epithelialized Wounding Event: Gradually Appeared Status: Date Acquired: 03/08/2019 Comorbid Anemia, Lymphedema, Chronic Obstructive Weeks Of Treatment: 7 History: Pulmonary Disease (COPD), Sleep Apnea, Clustered Wound: No Congestive Heart Failure, Coronary Artery Disease, Hypertension, Peripheral Venous Disease, Type II Diabetes, Osteoarthritis, Neuropathy, Received Chemotherapy Photos Wound Measurements Length: (cm) 0 % Reduction Width: (cm) 0 % Reduction Depth: (cm) 0 Epitheliali Area: (cm) 0 Tunneling: Volume: (cm) 0 Underminin in Area: 100% in Volume: 100% zation: Small (1-33%) No g: No Wound Description Classification: Grade 2 Foul Odor Wound Margin: Flat and Intact Slough/Fib Exudate Amount: Medium Exudate Type: Serous Exudate Color: amber After Cleansing: No rino Yes Wound Bed Granulation Amount: Medium (34-66%) Exposed Structure Granulation Quality: Red, Pink Fascia Exposed: No Necrotic Amount: Medium (34-66%) Fat Layer (Subcutaneous Tissue) Exposed: Yes Necrotic Quality: Adherent Slough Tendon Exposed: No Muscle Exposed: No Joint Exposed: No Bone Exposed: No Adam Merritt, Adam Merritt ([id number]) Electronic Signature(s) Signed: 05/06/2019 4:29:10 PM By: Montey Hora Entered By: Montey Hora on 05/06/2019 13:46:11 Adam Merritt ([id number]) -------------------------------------------------------------------------------- Vitals Details Patient Name: Adam Merritt Date of Service: 05/06/2019 1:00 PM Medical Record Number: [id number] Patient Account Number: [id number] Date of Birth/Sex: [birth year] (73 y.o. M) Treating RN: Army Melia Primary Care Sueann Brownley: [name] Other Clinician: Referring Tieara Flitton: [name] Treating Jarett Dralle/Extender: Melburn Hake, [name]: 7 Vital  Signs Time Taken: 13:07 Temperature (F): 98.4 Height (in): 66 Pulse (bpm): 74 Weight (lbs): 297 Respiratory Rate (breaths/min): 16 Body Mass Index (BMI): 47.9 Blood Pressure (mmHg): 173/72 Reference Range: 80 - 120 mg / dl Electronic Signature(s) Signed: 05/06/2019 3:58:31 PM By: Army Melia Entered By: Army Melia on 05/06/2019 13:08:04

## 2019-05-07 NOTE — Progress Notes (Signed)
NATION, CRADLE ([id number]) Visit Report for 05/06/2019 Chief Complaint Document Details Patient Name: Adam Merritt, Adam Merritt Date of Service: 05/06/2019 1:00 PM Medical Record Number: [id number] Patient Account Number: [id number] Date of Birth/Sex: 10/13/1945 (73 y.o. M) Treating RN: Montey Hora Primary Care Provider: [name] Other Clinician: Referring Provider: [name] Treating Provider/Extender: Melburn Hake, [name]: 7 Information Obtained from: Patient Chief Complaint Right toe and left LE Ulcer Electronic Signature(s) Signed: 05/06/2019 5:47:36 PM By: Worthy Keeler PA-C Entered By: Worthy Keeler on 05/06/2019 13:15:25 Adam Merritt ([id number]) -------------------------------------------------------------------------------- HPI Details Patient Name: Adam Merritt Date of Service: 05/06/2019 1:00 PM Medical Record Number: [id number] Patient Account Number: [id number] Date of Birth/Sex: [birth year] (73 y.o. M) Treating RN: Montey Hora Primary Care Provider: [name] Other Clinician: Referring Provider: [name] Treating Provider/Extender: Melburn Hake, [name]: 7 History of Present Illness HPI Description: 10/18/17-He is here for initial evaluation of bilateral lower extremity ulcerations in the presence of venous insufficiency and lymphedema. He has been seen by vascular medicine in the past, Dr. Lucky Cowboy, last seen in 2016. He does have a history of abnormal ABIs, which is to be expected given his lymphedema and venous insufficiency. According to Epic, it appears that all attempts for arterial evaluation and/or angiography were not follow through with by patient. He does have a history of being seen in lymphedema clinic in 2018, stopped going approximately 6 months ago stating "it didn't do any good". He does not have lymphedema pumps, he does not have custom fit compression wrap/stockings. He is diabetic and his recent A1c last month was 7.6.  He admits to chronic bilateral lower extremity pain, no change in pain since blister and ulceration development. He is currently being treated with Levaquin for bronchitis. He has home health and we will continue. 10/25/17-He is here in follow-up evaluation for bilateral lower extremity ulcerationssubtle he remains on Levaquin for bronchitis. Right lower extremity with no evidence of drainage or ulceration, persistent left lower extremity ulceration. He states that home health has not been out since his appointment. He went to [facility] on Tuesday, studies revealed: RIGHT ABI 0.9, TBI 0.6 LEFT ABI 1.1, TBI 0.6 with triphasic flow bilaterally. We will continue his same treatment plan. He has been educated on compression therapy and need for elevation. He will benefit from lymphedema pumps 11/01/17-He is here in follow-up evaluation for left lower extremity ulcer. The right lower extremity remains healed. He has home health services, but they have not been out to see the patient for 2-3 weeks. He states it home health physical therapy changed his dressing yesterday after therapy; he placed Ace wrap compression. We are still waiting for lymphedema pumps, reordered d/t need for company change. 11/08/17-He is here in follow-up evaluation for left lower extremity ulcer. It is improved. Edema is significantly improved with compression therapy. We will continue with same treatment plan and he will follow-up next week. No word regarding lymphedema pumps 11/15/17-He is here in follow-up evaluation for left lower extremity ulcer. He is healed and will be discharged from wound care services. I have reached out to [facility] regarding his lymphedema pumps. They have been unable to reach the patient; the contact number they had with the patient's wife's cell phone and she has not answered any unrecognized calls. Contact should be made today, trial planned for next week; [facility] will  continue to follow 11/27/17 on evaluation today patient has multiple blistered areas over  the right lower extremity his left lower extremity appears to be doing okay. These blistered areas show signs of no infection which is great news. With that being said he did have some necrotic skin overlying which was mechanically debrided away with saline and gauze today without complication. Overall post debridement the wounds appear to be doing better but in general his swelling seems to be increased. This is obviously not good news. I think this is what has given rise to the blisters. 12/04/17 on evaluation today patient presents for follow-up concerning his bilateral lower extremity edema in the right lower extremity ulcers. He has been tolerating the dressing changes without complication. With that being said he has had no real issues with the wraps which is also good news. Overall I'm pleased with the progress he's been making. 12/11/17 on evaluation today patient appears to be doing rather well in regard to his right lateral lower extremity ulcer. He's been tolerating the dressing changes without complication. Fortunately there does not appear to be any evidence of infection at this time. Overall I'm pleased with the progress that is being made. Unfortunately he has been in the hospital due to having what sounds to be a stomach virus/flu fortunately that is starting to get better. 12/18/17 on evaluation today patient actually appears to be doing very well in regard to his bilateral lower extremities the swelling is under fairly good control his lymphedema pumps are still not up and running quite yet. With that being said he does have several areas of opening noted as far as wounds are concerned mainly over the left lower extremity. With that being said I do believe once he gets lymphedema pumps this would least help you mention some the fluid and preventing this from occurring. Hopefully that will be set up  soon sleeves are Artie in place at his home he just waiting for the machine. 12/25/17 on evaluation today patient actually appears to be doing excellent in fact all of his ulcers appear to have resolved his Adam Merritt, Adam Merritt. ([id number]) legs appear very well. I do think he needs compression stockings we have discussed this and they are actually going to go to [place] today to elastic therapy to get this fitted for him. I think that is definitely a good thing to do. Readmission: 04/09/18 upon evaluation today this patient is seen for readmission due to bilateral lower extremity lymphedema. He has significant swelling of his extremities especially on the left although the right is also swollen he has weeping from both sides. There are no obvious open wounds at this point. Fortunately he has been doing fairly well for quite a bit of time since I last saw him. Nonetheless unfortunately this seems to have reopened and is giving quite a bit of trouble. He states this began about a week ago when he first called [place] to get in to be seen. No fevers, chills, nausea, or vomiting noted at this time. He has not been using his lymphedema pumps due to the fact that they won't fit on his leg at this point likewise is also not been using his compression for essentially the same reason. 04/16/18 upon evaluation today patient actually appears to be doing a little better in regard to the fluid in his bilateral lower extremities. With that being said he's had three falls since I saw him last week. He also states that he's been feeling very poorly. I was concerned last week and feel like that the concern is still  there as far as the congestion in his chest is concerned he seems to be breathing about the same as last week but again he states he's very weak he's not even able to walk further than from the chair to the door. His wife had to buy a wheelchair just to be able to get them out of the house to get to the appointment  today. This has me very concerned. 04/23/18 on evaluation today patient actually appears to be doing much better than last week's evaluation. At that time actually had to transport him to the ER via EMS and he subsequently was admitted for acute pulmonary edema, acute renal failure, and acute congestive heart failure. Fortunately he is doing much better. Apparently they did dialyze him and were able to take off roughly 35 pounds of fluid. Nonetheless he is feeling much better both in regard to his breathing and he's able to get around much better at this time compared to previous. Overall I'm very happy with how things are at this time. There does not appear to be any evidence of infection currently. No fevers, chills, nausea, or vomiting noted at this time. 04/30/2018 patient seen today for follow-up and management of bilateral lower extremity lymphedema. He did express being more sad today than usual due to the recent loss of his dog. He states that he has been compliant with using the lymphedema pumps. However he does admit a minute over the last 2-3 days he has not been using the pumps due to the recent loss of his dog. At this time there is no drainage or open wounds to his lower extremities. The left leg edema is measuring smaller today. Still has a significant amount of edema on bilateral lower extremities With dry flaky skin. He will be referred to the lymphedema clinic for further management. Will continue 3 layer compression wraps and follow-up in 1-2 weeks.Denies any pain, fever, chairs recently. No recent falls or injuries reported during this visit. 05/07/18 on evaluation today patient actually appears to be doing very well in regard to his lower extremities in general all things considered. With that being said he is having some pain in the legs just due to the amount of swelling. He does have an area where he had a blister on the left lateral lower extremity this is open at this point  other than that there's nothing else weeping at this time. 05/14/18 on evaluation today patient actually appears to be doing excellent all things considered in regard to his lower extremities. He still has a couple areas of weeping on each leg which has continued to be the issue for him. He does have an appointment with the lymphedema clinic although this isn't until February 2020. That was the earliest they had. In the meantime he has continued to tolerate the compression wraps without complication. 05/28/18 on evaluation today patient actually appears to be doing more poorly in regard to his left lower extremity where he has a wound open at this point. He also had a fall where he subsequently injured his right great toe which has led to an open wound at the site unfortunately. He has been tolerating the dressing changes without complication in general as far as the wraps are concerned that he has not been putting any dressing on the left 1st toe ulcer site. 06/11/18 on evaluation today patient appears to be doing much worse in regard to his bilateral lower extremity ulcers. He has been tolerating the dressing changes  without complication although his legs have not been wrapped more recently. Overall I am not very pleased with the way his legs appear. I do believe he needs to be back in compression wraps he still has not received his compression wraps from the [facility] hospital as of yet. 06/18/18 on evaluation today patient actually appears to be doing significantly better than last time I saw him. He has been tolerating the compression wraps without complication in the circumferential ulcers especially appear to be doing much better. His toe ulcer on the right in regard to the great toe is better although not as good as the legs in my pinion. No fevers chills noted Adam Merritt, Adam Merritt ([id number]) 07/02/18 on evaluation today patient appears to be doing much better in regard to his lower extremity ulcers.  Unfortunately since I last saw him he's had the distal portion of his right great toe if he dated it sounds as if this actually went downhill very quickly. I had only seen him a few days prior and the toe did not appear to be infected at that point subsequently became infected very rapidly and it was decided by the surgeon that the distal portion of the toe needed to be removed. The patient seems to be doing well in this regard he tells me. With that being said his lower extremities are doing better from the standpoint of the wounds although he is significantly swollen at this point. 07/09/18 on evaluation today patient appears to be doing better in regard to the wounds on his lower extremities. In fact everything is almost completely healed he is just a small area on the left posterior lower extremity that is open at this point. 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 With that being said he needs some kind of compression it does sound like he may not be wearing his compression, that is the wraps, during the entire time between when he's here visit to visit. Apparently his wife took the current one off because it began to "fall apart". 07/16/18 on evaluation today patient appears to be extremely swollen especially in regard to his left lower extremity unfortunately. He also has a new skin tear over the left lower extremity and there's a smaller area on the right lower extremity as well. Unfortunately this seems to be due in part to blistering and fluid buildup in his leg. He did get the reduction wraps that were ordered by the [facility] hospital for him to go to lymphedema clinic. With that being said his wounds on the legs have not healed to the point to where they would likely accept them as a patient lymphedema clinic  currently. We need to try to get this to heal. With that being said he's been taking his wraps off which is not doing him any favors at this point. In fact this is probably quite counterproductive compared to what needs to occur. We will likely need to increase to a four layer compression wrap and continue to also utilize elevation and he has to keep the wraps on not take them off as he's been doing currently hasn't had a wrap on since Saturday. 07/23/18 on evaluation today patient appears to be doing much better in regard to his bilateral lower extremities. In fact his left lower extremity which was the largest is  actually 15 cm smaller today compared to what it was last time he was here in our clinic. This is obviously good news after just one week. Nonetheless the differences he actually kept the wraps on during the entire week this time. That's not typical for him. I do believe he understands a little bit better now the severity of the situation and why it's important for him to keep these wraps on. 07/30/18 on evaluation today patient actually appears to be doing rather well in regard to his lower extremities. His legs are much smaller than they have been in the past and he actually has only one very small rudder superficial region remaining that is not closed on the left lateral/posterior lower extremity even this is almost completely close. I do believe likely next week he will be healed without any complications. I do think we need to continue the wraps however this seems to be beneficial for him. I also think it may be a good time for [place] to go ahead and see about getting the appointment with the lymphedema clinic which is supposed to be made for him in order to keep this moving along and hopefully get them into compression wraps that will in the end help him to remain healed. 08/06/18 on evaluation today patient actually appears to be doing very well in regard as bilateral lower extremities. In  fact his wounds appear to be completely healed at this time. 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 He is in the process of getting appointment with the lymphedema clinic we have made this referral were just waiting to hear back on the schedule time. We need to follow up on that today as well. 08/13/18 on evaluation today patient actually appears to be doing very well in regard to his bilateral lower extremities there are no open wounds at this point. We are gonna go ahead and see about ordering the Velcro compression wraps for him had a discussion with them about [place] doing it versus the [place] they feel like they can definitely afford going ahead and get the wraps themselves and they would prefer to try to avoid having to go to the lymphedema clinic if it all possible which I completely understand. As long as he has good compression I'm okay either way. 3/17//20 on evaluation today patient actually appears to be doing well in regard to his bilateral lower extremity ulcers. He has been tolerating the dressing changes without complication specifically the compression wraps. Overall is had no issues my fingers finding that I see at this point is that he is having trouble with constipation. He tells me he has not been able to go to the bathroom for about six days. He's taken to over-the-counter oral laxatives unfortunately this is not helping. He has not contacted his doctor. 08/27/18 on evaluation today patient appears to be doing fairly well in regard to his lower extremities at this point. There does not appear to be any new altars is swelling is very well controlled. We are still waiting for his Velcro compression wraps to arrive that should be sometime in the next week is Artie sent the check for these. 09/03/18 on evaluation today patient appears to be doing excellent in regard to his bilateral lower extremities which he shows Adam Merritt, Adam Merritt. ([id number]) no signs of wound openings in regard to this point. He does have his Velcro compression wraps which did arrive in the mail since  I saw him last week. Overall he is doing excellent in my pinion. 09/13/18 on evaluation today patient appears to be doing very well currently in regard to the overall appearance of his bilateral lower extremities although he's a little bit more swollen than last time we saw him. At that point he been discharged without any open wounds. Nonetheless he has a small open wound on the posterior left lower extremity with some evidence of cellulitis noted as well. Fortunately I feel like he has made good progress overall with regard to his lower extremities from were things used to be. 09/20/18 on evaluation today patient actually appears to be doing much better. The erythematous lower extremity is improving wound itself which is still open appears to be doing much better as far as but appearance as well as pain is concerned overall very pleased in this regard. There's no signs of active infection at this time. 09/27/18 on evaluation today patient's wounds on the lower extremity actually appear to be doing fairly well at this time which is good news. There is no evidence of active infection currently and again is just as left lower extremity were there any wounds at this point anyway. I believe they may be completely healed but again I'm not 100% sure based on evaluation today. I think one more week of observation would likely be a good idea. 10/04/18 on evaluation today patient actually appears to be doing excellent in regard to the left lower extremity which actually appears to be completely healed as of today. Unfortunately he's been having issues with his right lower extremity have a new wound that has opened. Fortunately there's no evidence of active infection at this time which is good news. 10/10/18 upon evaluation today patient actually appears to be doing a  little bit worse with the new open area on his right posterior lower extremity. He's been tolerating the dressing changes without complication. Right now we been using his compression wraps although I think we may need to switch back to actually performing bilateral compression wraps are in the clinic. No fevers, chills, nausea, or vomiting noted at this time. 10/17/18 on evaluation today patient actually appears to be doing quite well in regard to his bilateral lower extremity ulcers. He has been tolerating the reps without complication although he would prefer not to rewrap his legs as of today. Fortunately there's no signs of active infection which is good news. No fevers, chills, nausea, or vomiting noted at this time. 10/24/18 on evaluation today patient appears to be doing very well in regard to his lower extremities. His right lower extremity is shown signs of healing and his left lower [place] though not healed appears to be improving which is excellent news. Overall very pleased with how things seem to be progressing at this time. The patient is likewise happy to hear this. His Velcro compression wraps however have not been put on properly will gonna show his wife how to do that properly today. 10/31/18 on evaluation today patient appears to be doing more poorly in regard to his left lower extremity in particular although both lower extremities actually are showing some signs of being worse than my previous evaluation. Unfortunately I'm just not sure that his compression stockings even with the use of the compression/lymphedema pumps seem to be controlling this well. Upon further questioning he tells me that he also is not able to lie flat in the bed due to his congestive heart failure and difficulty breathing. For that  reason he sleeps in his recliner. To make matters worse his recliner also cannot even hold his legs up so instead of even being somewhat elevated they pretty much hang down to  the floor. This is the way he sleeps each night which is definitely counterproductive to everything else that were attempting to do from the standpoint of controlling his fluid. Nonetheless I think that he potentially could benefit from a hospital bed although this would be something that his primary care provider would likely have to order since anything that is order on our side has to be directly related to wound care and again the hospital bed is not necessarily a direct relation to although I think it does contribute to his overall wound status on his lower extremities. 11/07/18 on evaluation today patient appears to be doing better in regard to his bilateral lower extremity. He's been tolerating the dressing changes without complication. We did in the interim since I last saw him switch to just using extras orbiting new alginate and that seems to have done much better for him. I'm very pleased with the overall progress that is made. 11/14/18 on evaluation today patient appears to be redoing rather well in regard to his left lower extremity ulcers which are the only ones remaining at this point. Fortunately there's no signs of active infection at this time which is good news. Overall been very pleased with how things seem to be progressing currently. No fevers, chills, nausea, or vomiting noted at this time. 11/20/17 on evaluation today patient actually appears to be doing quite well in regard to his lower extremities on the left on the right he has several blisters that showed up although there's some question about whether or not he has had his broker compression wraps on like he was supposed to or not. Fortunately there's no signs of active infection at this time which is good news. Unfortunately though he is doing well from the standpoint of the left leg the right leg is not doing as well again this Adam Merritt, Adam Merritt. ([id number]) is when we did not wrap last week. 11/28/18 on evaluation today  patient appears to be doing well in regard to his bilateral lower extremities is left appears to be healed is right is not healed but is very close to being so. Overall very pleased with how things seem to be progressing. Patient is likewise happy that things are doing well. 12/09/18 on evaluation today patient actually appears to be completely healed which is excellent news. He actually seems to be doing well in regard to the swelling of the bilateral lower extremities which is also great news. Overall very pleased with how things seem to be progressing. Readmission: 03/18/2019 upon evaluation today patient presents for reevaluation concerning issues that he is having with his left lower extremity where he does have a wound noted upon inspection today. He also has a wound on the right second toe on the tip where it is apparently been rubbing in his shoe I did look at issues and you can actually see where this has been occurring as well. Fortunately there is no signs of infection with regard to the toe necessarily although it is very swollen compared to normal that does have me somewhat concerned about the possibility of further evaluation for infection/osteomyelitis. I would recommend an x-ray to start with. Otherwise he states that it is been 1-2 weeks that he has had the draining in regard to left lower extremity he does not  know how this happened he has been wearing his compression appropriately and I do see that as well today but nonetheless I do think that he needs to continue to be very cautious with regard to elevation as well. 03/25/2019 on evaluation today patient appears to be doing much better in regard to his lower extremity at this time. That is on the left. He did culture positive for Pseudomonas but the good news is he seems to be doing much better the gentamicin we applied topically seems to have done a great job for him. There is no signs of active infection at this time systemically  and locally he is doing much better. No fevers, chills, nausea, vomiting, or diarrhea. In regard to his right toe this seems to be doing much better there is very little pressure noted at this point I did clean up some of the callus today around the edges of the wound as well as the surface of the wound but to be honest he is progressing quite nicely. 10/30; the area on the left anterior lower tibia area is healed over. His edema control is adequate even though his use of the compression pumps seems very intermittent. He has a juxta lite stocking on the right leg and a think there is 1 for the left leg at home. His wife is putting these on. He has an area on the plantar right second toe which is a hammertoe they are trying to offload this 04/08/2019 on evaluation today patient appears to be doing well in regard to his lower extremities which is good news. We are seeing him today for his toe ulcer which is giving him a little bit more trouble but again still seems to be doing better at this point which is good news. He is going require some sharp debridement in regard to the toe today however. 04/15/2019 on evaluation today patient actually appears to be doing quite well with regard to his toe ulcer. I am very pleased with how things are doing in that regard. With regard to his lower extremity edema in general he tells me he is not been using the lymphedema pumps regularly although he does not use them. I think that he needs to use them more regularly he does have a small blister on the left lower extremity this is not open at this point but obviously it something that can get worse if he does not keep the compression going and the pumps going as well. 04/22/2019 on evaluation today patient appears to be doing well with regard to his toe ulcer. He has been tolerating the dressing changes without complication. This is measuring slightly smaller this week as compared to last week. Fortunately there is  no signs of active infection at this time. 05/06/2019 on evaluation today patient actually appears to be doing excellent. In fact his toe appears to be completely healed which is great news. There is no signs of active infection at this time. Electronic Signature(s) Signed: 05/06/2019 5:47:36 PM By: Worthy Keeler PA-C Entered By: Worthy Keeler on 05/06/2019 13:52:15 Adam Merritt ([id number]) -------------------------------------------------------------------------------- Physical Exam Details Patient Name: Adam Merritt Date of Service: 05/06/2019 1:00 PM Medical Record Number: [id number] Patient Account Number: [id number] Date of Birth/Sex: 03-01-1946 (73 y.o. M) Treating RN: Montey Hora Primary Care Provider: [name] Other Clinician: Referring Provider: [name] Treating Provider/Extender: STONE III, [name]: 7 Constitutional Well-nourished and well-hydrated in no acute distress. Respiratory normal breathing without difficulty.  Psychiatric this patient is able to make decisions and demonstrates good insight into disease process. Alert and Oriented x 3. pleasant and cooperative. Notes On evaluation today patient's wound appears to be completely epithelialized and seems to be doing extremely well. I see no signs of an open wound remaining on the toe which is great news he has done excellent. Electronic Signature(s) Signed: 05/06/2019 5:47:36 PM By: Worthy Keeler PA-C Entered By: Worthy Keeler on 05/06/2019 13:53:10 Adam Merritt ([id number]) -------------------------------------------------------------------------------- Physician Orders Details Patient Name: Adam Merritt Date of Service: 05/06/2019 1:00 PM Medical Record Number: [id number] Patient Account Number: [id number] Date of Birth/Sex: 06-27-1945 (73 y.o. 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 M) Treating RN: Montey Hora Primary Care Provider: [name] Other Clinician: Referring Provider: [name] Treating Provider/Extender: Melburn Hake, [name]: 7 Active Problems ICD-10 Evaluated Encounter Code Description Active Date Today Diagnosis I89.0 Lymphedema, not elsewhere classified 03/18/2019 No Yes E11.621 Type 2 diabetes mellitus with foot ulcer 03/18/2019 No Yes L97.512 Non-pressure chronic ulcer of other part of right foot with fat 03/18/2019 No Yes layer exposed L97.822 Non-pressure chronic ulcer of other part of left lower leg with 03/18/2019 No Yes fat layer exposed E66.09 Other obesity due to excess calories 03/18/2019 No Yes Inactive Problems Resolved Problems Electronic Signature(s) Signed: 05/06/2019 5:47:36 PM By: Worthy Keeler PA-C Entered By: Worthy Keeler on 05/06/2019 13:15:19 Adam Merritt  ([id number]) -------------------------------------------------------------------------------- Progress Note Details Patient Name: Adam Merritt Date of Service: 05/06/2019 1:00 PM Medical Record Number: [id number] Patient Account Number: [id number] Date of Birth/Sex: 10/27/1945 (73 y.o. M) Treating RN: Montey Hora Primary Care Provider: [name] Other Clinician: Referring Provider: [name] Treating Provider/Extender: Melburn Hake, [name]: 7 Subjective Chief Complaint Information obtained from Patient Right toe and left LE Ulcer History of Present Illness (HPI) 10/18/17-He is here for initial evaluation of bilateral lower extremity ulcerations in the presence of venous insufficiency and lymphedema. He has been seen by vascular medicine in the past, Dr. Lucky Cowboy, last seen in 2016. He does have a history of abnormal ABIs, which is to be expected given his lymphedema and venous insufficiency. According to Epic, it appears that all attempts for arterial evaluation and/or angiography were not follow through with by patient. He does have a history of being seen in lymphedema clinic in 2018, stopped going approximately 6 months ago stating "it didn't do any good". He does not have lymphedema pumps, he does not have custom fit compression wrap/stockings. He is diabetic and his recent A1c last month was 7.6. He admits to chronic bilateral lower extremity pain, no  change in pain since blister and ulceration development. He is currently being treated with Levaquin for bronchitis. He has home health and we will continue. 10/25/17-He is here in follow-up evaluation for bilateral lower extremity ulcerationssubtle he remains on Levaquin for bronchitis. Right lower extremity with no evidence of drainage or ulceration, persistent left lower extremity ulceration. He states that home health has not been out since his appointment. He went to [facility] on Tuesday, studies  revealed: RIGHT ABI 0.9, TBI 0.6 LEFT ABI 1.1, TBI 0.6 with triphasic flow bilaterally. We will continue his same treatment plan. He has been educated on compression therapy and need for elevation. He will benefit from lymphedema pumps 11/01/17-He is here in follow-up evaluation for left lower extremity ulcer. The right lower extremity remains healed. He has home health services, but they have not been out to see the patient for 2-3 weeks. He states it home health physical therapy changed his dressing yesterday after therapy; he placed Ace wrap compression. We are still waiting for lymphedema pumps, reordered d/t need for company change. 11/08/17-He is here in follow-up evaluation for left lower extremity ulcer. It is improved. Edema is significantly improved with compression therapy. We will continue with same treatment plan and he will follow-up next week. No word regarding lymphedema pumps 11/15/17-He is here in follow-up evaluation for left lower extremity ulcer. He is healed and will be discharged from wound care services. I have reached out to [facility] regarding his lymphedema pumps. They have been unable to reach the patient; the contact number they had with the patient's wife's cell phone and she has not answered any unrecognized calls. Contact should be made today, trial planned for next week; [facility] will continue to follow 11/27/17 on evaluation today patient has multiple blistered areas over the right lower extremity his left lower extremity appears to be doing okay. These blistered areas show signs of no infection which is great news. With that being said he did have some necrotic skin overlying which was mechanically debrided away with saline and gauze today without complication. Overall post debridement the wounds appear to be doing better but in general his swelling seems to be increased. This is obviously not good news. I think this is what has given rise to the  blisters. 12/04/17 on evaluation today patient presents for follow-up concerning his bilateral lower extremity edema in the right lower extremity ulcers. He has been tolerating the dressing changes without complication. With that being said he has had no real issues with the wraps which is also good news. Overall I'm pleased with the progress he's been making. 12/11/17 on evaluation today patient appears to be doing rather well in regard to his right lateral lower extremity ulcer. He's been tolerating the dressing changes without complication. Fortunately there does not appear to be any evidence of infection at this time. Overall I'm pleased with the progress that is being made. Unfortunately he has been in the hospital due to having what sounds to be a stomach virus/flu fortunately that is starting to get better. 12/18/17 on evaluation today patient actually appears to be doing very well in regard to his bilateral lower extremities the SID, GREENER. ([id number]) swelling is under fairly good control his lymphedema pumps are still not up and running quite yet. With that being said he does have several areas of opening noted as far as wounds are concerned mainly over the left lower extremity. With that being said I do believe once  he gets lymphedema pumps this would least help you mention some the fluid and preventing this from occurring. Hopefully that will be set up soon sleeves are Artie in place at his home he just waiting for the machine. 12/25/17 on evaluation today patient actually appears to be doing excellent in fact all of his ulcers appear to have resolved his legs appear very well. I do think he needs compression stockings we have discussed this and they are actually going to go to [place] today to elastic therapy to get this fitted for him. I think that is definitely a good thing to do. Readmission: 04/09/18 upon evaluation today this patient is seen for readmission due to bilateral lower  extremity lymphedema. He has significant swelling of his extremities especially on the left although the right is also swollen he has weeping from both sides. There are no obvious open wounds at this point. Fortunately he has been doing fairly well for quite a bit of time since I last saw him. Nonetheless unfortunately this seems to have reopened and is giving quite a bit of trouble. He states this began about a week ago when he first called [place] to get in to be seen. No fevers, chills, nausea, or vomiting noted at this time. He has not been using his lymphedema pumps due to the fact that they won't fit on his leg at this point likewise is also not been using his compression for essentially the same reason. 04/16/18 upon evaluation today patient actually appears to be doing a little better in regard to the fluid in his bilateral lower extremities. With that being said he's had three falls since I saw him last week. He also states that he's been feeling very poorly. I was concerned last week and feel like that the concern is still there as far as the congestion in his chest is concerned he seems to be breathing about the same as last week but again he states he's very weak he's not even able to walk further than from the chair to the door. His wife had to buy a wheelchair just to be able to get them out of the house to get to the appointment today. This has me very concerned. 04/23/18 on evaluation today patient actually appears to be doing much better than last week's evaluation. At that time actually had to transport him to the ER via EMS and he subsequently was admitted for acute pulmonary edema, acute renal failure, and acute congestive heart failure. Fortunately he is doing much better. Apparently they did dialyze him and were able to take off roughly 35 pounds of fluid. Nonetheless he is feeling much better both in regard to his breathing and he's able to get around much better at this time  compared to previous. Overall I'm very happy with how things are at this time. There does not appear to be any evidence of infection currently. No fevers, chills, nausea, or vomiting noted at this time. 04/30/2018 patient seen today for follow-up and management of bilateral lower extremity lymphedema. He did express being more sad today than usual due to the recent loss of his dog. He states that he has been compliant with using the lymphedema pumps. However he does admit a minute over the last 2-3 days he has not been using the pumps due to the recent loss of his dog. At this time there is no drainage or open wounds to his lower extremities. The left leg edema is measuring smaller today.  Still has a significant amount of edema on bilateral lower extremities With dry flaky skin. He will be referred to the lymphedema clinic for further management. Will continue 3 layer compression wraps and follow-up in 1-2 weeks.Denies any pain, fever, chairs recently. No recent falls or injuries reported during this visit. 05/07/18 on evaluation today patient actually appears to be doing very well in regard to his lower extremities in general all things considered. With that being said he is having some pain in the legs just due to the amount of swelling. He does have an area where he had a blister on the left lateral lower extremity this is open at this point other than that there's nothing else weeping at this time. 05/14/18 on evaluation today patient actually appears to be doing excellent all things considered in regard to his lower extremities. He still has a couple areas of weeping on each leg which has continued to be the issue for him. He does have an appointment with the lymphedema clinic although this isn't until February 2020. That was the earliest they had. In the meantime he has continued to tolerate the compression wraps without complication. 05/28/18 on evaluation today patient actually appears to be  doing more poorly in regard to his left lower extremity where he has a wound open at this point. He also had a fall where he subsequently injured his right great toe which has led to an open wound at the site unfortunately. He has been tolerating the dressing changes without complication in general as far as the wraps are concerned that he has not been putting any dressing on the left 1st toe ulcer site. 06/11/18 on evaluation today patient appears to be doing much worse in regard to his bilateral lower extremity ulcers. He has been tolerating the dressing changes without complication although his legs have not been wrapped more recently. Overall I am not very pleased with the way his legs appear. I do believe he needs to be back in compression wraps he still has not received his compression wraps from the [facility] hospital as of yet. Adam Merritt, Adam Merritt ([id number]) 06/18/18 on evaluation today patient actually appears to be doing significantly better than last time I saw him. He has been tolerating the compression wraps without complication in the circumferential ulcers especially appear to be doing much better. His toe ulcer on the right in regard to the great toe is better although not as good as the legs in my pinion. No fevers chills noted 07/02/18 on evaluation today patient appears to be doing much better in regard to his lower extremity ulcers. Unfortunately since I last saw him he's had the distal portion of his right great toe if he dated it sounds as if this actually went downhill very quickly. I had only seen him a few days prior and the toe did not appear to be infected at that point subsequently became infected very rapidly and it was decided by the surgeon that the distal portion of the toe needed to be removed. The patient seems to be doing well in this regard he tells me. With that being said his lower extremities are doing better from the standpoint of the wounds although he is significantly  swollen at this point. 07/09/18 on evaluation today patient appears to be doing better in regard to the wounds on his lower extremities. In fact everything is almost completely healed he is just a small area on the left posterior lower extremity that is open  at this point. 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 With that being said he needs some kind of compression it does sound like he may not be wearing his compression, that is the wraps, during the entire time between when he's here visit to visit. Apparently his wife took the current one off because it began to "fall apart". 07/16/18 on evaluation today patient appears to be extremely swollen especially in regard to his left lower extremity unfortunately. He also has a new skin tear over the left lower extremity and there's a smaller area on the right lower extremity as well. Unfortunately this seems to be due in part to blistering and fluid buildup in his leg. He did get the reduction wraps that were ordered by the [facility] hospital for him to go to lymphedema clinic. With that being said his wounds on the legs have not healed to the point to where they would likely accept them as a patient lymphedema clinic currently. We need to try to get this to heal. With that being said he's been taking his wraps off which is not doing him any favors at this point. In fact this is probably quite counterproductive compared to what needs to occur. We will likely need to increase to a four layer compression wrap and continue to also utilize elevation and he has to keep the wraps on not take them off as he's been doing currently hasn't had a wrap on since Saturday. 07/23/18 on evaluation today patient appears to be doing much better in regard to his bilateral lower extremities. In fact  his left lower extremity which was the largest is actually 15 cm smaller today compared to what it was last time he was here in our clinic. This is obviously good news after just one week. Nonetheless the differences he actually kept the wraps on during the entire week this time. That's not typical for him. I do believe he understands a little bit better now the severity of the situation and why it's important for him to keep these wraps on. 07/30/18 on evaluation today patient actually appears to be doing rather well in regard to his lower extremities. His legs are much smaller than they have been in the past and he actually has only one very small rudder superficial region remaining that is not closed on the left lateral/posterior lower extremity even this is almost completely close. I do believe likely next week he will be healed without any complications. I do think we need to continue the wraps however this seems to be beneficial for him. I also think it may be a good time for [place] to go ahead and see about getting the appointment with the lymphedema clinic which is supposed to be made for him in order to keep this moving along and hopefully get them into compression wraps that will in the end help him to remain healed. 08/06/18 on evaluation today patient actually appears to be doing very well in regard as bilateral lower extremities. In fact his wounds appear to be completely healed at this time. 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 He is in the process of getting appointment with the lymphedema clinic we have made this referral were just waiting to hear back on the schedule time.  We need to follow up on that today as well. 08/13/18 on evaluation today patient actually appears to be doing very well in regard to his bilateral lower extremities there are no open wounds at this point. We are gonna go ahead and see about ordering the  Velcro compression wraps for him had a discussion with them about [place] doing it versus the [place] they feel like they can definitely afford going ahead and get the wraps themselves and they would prefer to try to avoid having to go to the lymphedema clinic if it all possible which I completely understand. As long as he has good compression I'm okay either way. 3/17//20 on evaluation today patient actually appears to be doing well in regard to his bilateral lower extremity ulcers. He has been tolerating the dressing changes without complication specifically the compression wraps. Overall is had no issues my fingers finding that I see at this point is that he is having trouble with constipation. He tells me he has not been able to go to the bathroom for about six days. He's taken to over-the-counter oral laxatives unfortunately this is not helping. He has not contacted his doctor. DARROW, BARREIRO ([id number]) 08/27/18 on evaluation today patient appears to be doing fairly well in regard to his lower extremities at this point. There does not appear to be any new altars is swelling is very well controlled. We are still waiting for his Velcro compression wraps to arrive that should be sometime in the next week is Artie sent the check for these. 09/03/18 on evaluation today patient appears to be doing excellent in regard to his bilateral lower extremities which he shows no signs of wound openings in regard to this point. He does have his Velcro compression wraps which did arrive in the mail since I saw him last week. Overall he is doing excellent in my pinion. 09/13/18 on evaluation today patient appears to be doing very well currently in regard to the overall appearance of his bilateral lower extremities although he's a little bit more swollen than last time we saw him. At that point he been discharged without any open wounds. Nonetheless he has a small open wound on the posterior left lower extremity with some  evidence of cellulitis noted as well. Fortunately I feel like he has made good progress overall with regard to his lower extremities from were things used to be. 09/20/18 on evaluation today patient actually appears to be doing much better. The erythematous lower extremity is improving wound itself which is still open appears to be doing much better as far as but appearance as well as pain is concerned overall very pleased in this regard. There's no signs of active infection at this time. 09/27/18 on evaluation today patient's wounds on the lower extremity actually appear to be doing fairly well at this time which is good news. There is no evidence of active infection currently and again is just as left lower extremity were there any wounds at this point anyway. I believe they may be completely healed but again I'm not 100% sure based on evaluation today. I think one more week of observation would likely be a good idea. 10/04/18 on evaluation today patient actually appears to be doing excellent in regard to the left lower extremity which actually appears to be completely healed as of today. Unfortunately he's been having issues with his right lower extremity have a new wound that has opened. Fortunately there's no evidence of active infection at  this time which is good news. 10/10/18 upon evaluation today patient actually appears to be doing a little bit worse with the new open area on his right posterior lower extremity. He's been tolerating the dressing changes without complication. Right now we been using his compression wraps although I think we may need to switch back to actually performing bilateral compression wraps are in the clinic. No fevers, chills, nausea, or vomiting noted at this time. 10/17/18 on evaluation today patient actually appears to be doing quite well in regard to his bilateral lower extremity ulcers. He has been tolerating the reps without complication although he would prefer not  to rewrap his legs as of today. Fortunately there's no signs of active infection which is good news. No fevers, chills, nausea, or vomiting noted at this time. 10/24/18 on evaluation today patient appears to be doing very well in regard to his lower extremities. His right lower extremity is shown signs of healing and his left lower [place] though not healed appears to be improving which is excellent news. Overall very pleased with how things seem to be progressing at this time. The patient is likewise happy to hear this. His Velcro compression wraps however have not been put on properly will gonna show his wife how to do that properly today. 10/31/18 on evaluation today patient appears to be doing more poorly in regard to his left lower extremity in particular although both lower extremities actually are showing some signs of being worse than my previous evaluation. Unfortunately I'm just not sure that his compression stockings even with the use of the compression/lymphedema pumps seem to be controlling this well. Upon further questioning he tells me that he also is not able to lie flat in the bed due to his congestive heart failure and difficulty breathing. For that reason he sleeps in his recliner. To make matters worse his recliner also cannot even hold his legs up so instead of even being somewhat elevated they pretty much hang down to the floor. This is the way he sleeps each night which is definitely counterproductive to everything else that were attempting to do from the standpoint of controlling his fluid. Nonetheless I think that he potentially could benefit from a hospital bed although this would be something that his primary care provider would likely have to order since anything that is order on our side has to be directly related to wound care and again the hospital bed is not necessarily a direct relation to although I think it does contribute to his overall wound status on his lower  extremities. 11/07/18 on evaluation today patient appears to be doing better in regard to his bilateral lower extremity. He's been tolerating the dressing changes without complication. We did in the interim since I last saw him switch to just using extras orbiting new alginate and that seems to have done much better for him. I'm very pleased with the overall progress that is made. 11/14/18 on evaluation today patient appears to be redoing rather well in regard to his left lower extremity ulcers which are the only ones remaining at this point. Fortunately there's no signs of active infection at this time which is good news. Overall been Adam Merritt, Adam Merritt ([id number]) very pleased with how things seem to be progressing currently. No fevers, chills, nausea, or vomiting noted at this time. 11/20/17 on evaluation today patient actually appears to be doing quite well in regard to his lower extremities on the left on the right he has  several blisters that showed up although there's some question about whether or not he has had his broker compression wraps on like he was supposed to or not. Fortunately there's no signs of active infection at this time which is good news. Unfortunately though he is doing well from the standpoint of the left leg the right leg is not doing as well again this is when we did not wrap last week. 11/28/18 on evaluation today patient appears to be doing well in regard to his bilateral lower extremities is left appears to be healed is right is not healed but is very close to being so. Overall very pleased with how things seem to be progressing. Patient is likewise happy that things are doing well. 12/09/18 on evaluation today patient actually appears to be completely healed which is excellent news. He actually seems to be doing well in regard to the swelling of the bilateral lower extremities which is also great news. Overall very pleased with how things seem to be  progressing. Readmission: 03/18/2019 upon evaluation today patient presents for reevaluation concerning issues that he is having with his left lower extremity where he does have a wound noted upon inspection today. He also has a wound on the right second toe on the tip where it is apparently been rubbing in his shoe I did look at issues and you can actually see where this has been occurring as well. Fortunately there is no signs of infection with regard to the toe necessarily although it is very swollen compared to normal that does have me somewhat concerned about the possibility of further evaluation for infection/osteomyelitis. I would recommend an x-ray to start with. Otherwise he states that it is been 1-2 weeks that he has had the draining in regard to left lower extremity he does not know how this happened he has been wearing his compression appropriately and I do see that as well today but nonetheless I do think that he needs to continue to be very cautious with regard to elevation as well. 03/25/2019 on evaluation today patient appears to be doing much better in regard to his lower extremity at this time. That is on the left. He did culture positive for Pseudomonas but the good news is he seems to be doing much better the gentamicin we applied topically seems to have done a great job for him. There is no signs of active infection at this time systemically and locally he is doing much better. No fevers, chills, nausea, vomiting, or diarrhea. In regard to his right toe this seems to be doing much better there is very little pressure noted at this point I did clean up some of the callus today around the edges of the wound as well as the surface of the wound but to be honest he is progressing quite nicely. 10/30; the area on the left anterior lower tibia area is healed over. His edema control is adequate even though his use of the compression pumps seems very intermittent. He has a juxta lite  stocking on the right leg and a think there is 1 for the left leg at home. His wife is putting these on. He has an area on the plantar right second toe which is a hammertoe they are trying to offload this 04/08/2019 on evaluation today patient appears to be doing well in regard to his lower extremities which is good news. We are seeing him today for his toe ulcer which is giving him a little bit  more trouble but again still seems to be doing better at this point which is good news. He is going require some sharp debridement in regard to the toe today however. 04/15/2019 on evaluation today patient actually appears to be doing quite well with regard to his toe ulcer. I am very pleased with how things are doing in that regard. With regard to his lower extremity edema in general he tells me he is not been using the lymphedema pumps regularly although he does not use them. I think that he needs to use them more regularly he does have a small blister on the left lower extremity this is not open at this point but obviously it something that can get worse if he does not keep the compression going and the pumps going as well. 04/22/2019 on evaluation today patient appears to be doing well with regard to his toe ulcer. He has been tolerating the dressing changes without complication. This is measuring slightly smaller this week as compared to last week. Fortunately there is no signs of active infection at this time. 05/06/2019 on evaluation today patient actually appears to be doing excellent. In fact his toe appears to be completely healed which is great news. There is no signs of active infection at this time. Patient History Information obtained from Patient. Family History QUASHAUN, LAZALDE ([id number]) Cancer - Paternal Grandparents, Kidney Disease - Siblings, Lung Disease - Mother, No family history of Diabetes, Heart Disease, Hereditary Spherocytosis, Hypertension, Seizures, Stroke, Thyroid  Problems, Tuberculosis. Social History Never smoker, Marital Status - Married, Alcohol Use - Never, Drug Use - No History, Caffeine Use - Never. Medical History Eyes Denies history of Cataracts, Glaucoma, Optic Neuritis Ear/Nose/Mouth/Throat Denies history of Chronic sinus problems/congestion, Middle ear problems Hematologic/Lymphatic Patient has history of Anemia - aplastic anemia, Lymphedema Denies history of Hemophilia, Human Immunodeficiency Virus, Sickle Cell Disease Respiratory Patient has history of Chronic Obstructive Pulmonary Disease (COPD), Sleep Apnea Denies history of Aspiration, Asthma, Pneumothorax, Tuberculosis Cardiovascular Patient has history of Congestive Heart Failure, Coronary Artery Disease, Hypertension, Peripheral Venous Disease Denies history of Angina, Arrhythmia, Deep Vein Thrombosis, Hypotension, Myocardial Infarction, Peripheral Arterial Disease, Phlebitis, Vasculitis Gastrointestinal Denies history of Cirrhosis , Colitis, Crohn s, Hepatitis A, Hepatitis B, Hepatitis C Endocrine Patient has history of Type II Diabetes Denies history of Type I Diabetes Genitourinary Denies history of End Stage Renal Disease Immunological Denies history of Lupus Erythematosus, Raynaud s, Scleroderma Integumentary (Skin) Denies history of History of Burn, History of pressure wounds Musculoskeletal Patient has history of Osteoarthritis Denies history of Gout, Rheumatoid Arthritis, Osteomyelitis Neurologic Patient has history of Neuropathy Denies history of Dementia, Quadriplegia, Paraplegia, Seizure Disorder Oncologic Patient has history of Received Chemotherapy - last dose 2006 Denies history of Received Radiation Psychiatric Denies history of Anorexia/bulimia, Confinement Anxiety Hospitalization/Surgery History - bronchitis. Medical And Surgical History Notes Respiratory home O2 at night as needed Cardiovascular varicose veins Neurologic CVA - 1992 Review  of Systems (ROS) Constitutional Symptoms (General Health) Denies complaints or symptoms of Fatigue, Fever, Chills, Marked Weight Change. Respiratory Denies complaints or symptoms of Chronic or frequent coughs, Shortness of Breath. Adam Merritt, Adam Merritt ([id number]) Cardiovascular Denies complaints or symptoms of Chest pain, LE edema. Psychiatric Denies complaints or symptoms of Anxiety, Claustrophobia. Objective Constitutional Well-nourished and well-hydrated in no acute distress. Vitals Time Taken: 1:07 PM, Height: 66 in, Weight: 297 lbs, BMI: 47.9, Temperature: 98.4 F, Pulse: 74 bpm, Respiratory Rate: 16 breaths/min, Blood Pressure: 173/72 mmHg. Respiratory normal breathing without difficulty. Psychiatric this  patient is able to make decisions and demonstrates good insight into disease process. Alert and Oriented x 3. pleasant and cooperative. General Notes: On evaluation today patient's wound appears to be completely epithelialized and seems to be doing extremely well. I see no signs of an open wound remaining on the toe which is great news he has done excellent. Integumentary (Hair, Skin) Wound #24 status is Healed - Epithelialized. Original cause of wound was Gradually Appeared. The wound is located on the Right,Distal Toe Second. The wound measures 0cm length x 0cm width x 0cm depth; 0cm^2 area and 0cm^3 volume. There is Fat Layer (Subcutaneous Tissue) Exposed exposed. There is no tunneling or undermining noted. There is a medium amount of serous drainage noted. The wound margin is flat and intact. There is medium (34-66%) red, pink granulation within the wound bed. There is a medium (34-66%) amount of necrotic tissue within the wound bed including Adherent Slough. 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 2. I would recommend that he continue to use a corn or callus pad over the wound area in order to help with prevention of the wound reoccurring. 3. Patient needs to be careful about what shoes he wears and ensure that they are properly fitting not too tight and not sliding around on his feet this was discussed with him again today. We will see the patient back for follow-up visit as needed. Electronic Signature(s) Signed: 05/06/2019 5:47:36 PM By: Worthy Keeler PA-C Entered By: Worthy Keeler on 05/06/2019 13:54:06 Adam Merritt ([id number]) -------------------------------------------------------------------------------- ROS/PFSH Details Patient Name: Adam Merritt Date of Service: 05/06/2019 1:00 PM Medical Record Number: [id number] Patient Account Number: [id number] Date of Birth/Sex: 12/23/1945 (73 y.o. M) Treating RN: Montey Hora Primary Care Provider: [name] Other Clinician: Referring Provider: [name] Treating Provider/Extender: Melburn Hake, [name]: 7 Information Obtained From Patient Constitutional Symptoms (General Health) Complaints and Symptoms: Negative for: Fatigue; Fever; Chills; Marked Weight Change Respiratory Complaints and Symptoms: Negative for: Chronic or frequent coughs; Shortness of Breath Medical History: Positive for: Chronic Obstructive Pulmonary Disease (COPD); Sleep Apnea Negative for: Aspiration; Asthma; Pneumothorax; Tuberculosis Past Medical History Notes: home O2 at night as needed Cardiovascular Complaints and Symptoms: Negative for: Chest pain; LE edema Medical History: Positive for: Congestive Heart Failure;  Coronary Artery Disease; Hypertension; Peripheral Venous Disease Negative for: Angina; Arrhythmia; Deep Vein Thrombosis; Hypotension; Myocardial Infarction; Peripheral Arterial Disease; Phlebitis; Vasculitis Past Medical History Notes: varicose veins Psychiatric Complaints and Symptoms: Negative for: Anxiety; Claustrophobia Medical History: Negative for: Anorexia/bulimia; Confinement Anxiety Eyes Medical History: Negative for: Cataracts; Glaucoma; Optic Neuritis Ear/Nose/Mouth/Throat Medical History: Negative for: Chronic sinus problems/congestion; Middle ear problems Hematologic/Lymphatic Adam Merritt, MALENFANT ([id number]) Medical History: Positive for: Anemia - aplastic anemia; Lymphedema Negative for: Hemophilia; Human Immunodeficiency Virus; Sickle Cell Disease Gastrointestinal Medical History: Negative for: Cirrhosis ; Colitis; Crohnos; Hepatitis A; Hepatitis B; Hepatitis C Endocrine Medical History: Positive for: Type II Diabetes Negative for: Type I Diabetes Time with diabetes: since 2006 Treated with: Insulin Blood sugar tested every day: Yes Tested :  Blood sugar testing results: Breakfast: 209 Genitourinary Medical History: Negative for: End Stage Renal Disease Immunological Medical History: Negative for: Lupus Erythematosus; Raynaudos; Scleroderma Integumentary (Skin) Medical History: Negative for: History of Burn; History of pressure wounds Musculoskeletal Medical History: Positive for: Osteoarthritis Negative for: Gout; Rheumatoid Arthritis; Osteomyelitis Neurologic Medical History: Positive for: Neuropathy Negative for: Dementia; Quadriplegia; Paraplegia; Seizure Disorder Past Medical History Notes: CVA - 1992 Oncologic Medical History: Positive for: Received Chemotherapy - last dose 2006 Negative for: Received Radiation Immunizations Pneumococcal Vaccine: Received Pneumococcal Vaccination: Yes JONMARC, BODKIN ([id number]) Implantable Devices No  devices added Hospitalization / Surgery History Type of Hospitalization/Surgery bronchitis Family and Social History Cancer: Yes - Paternal Grandparents; Diabetes: No; Heart Disease: No; Hereditary Spherocytosis: No; Hypertension: No; Kidney Disease: Yes - Siblings; Lung Disease: Yes - Mother; Seizures: No; Stroke: No; Thyroid Problems: No; Tuberculosis: No; Never smoker; Marital Status - Married; Alcohol Use: Never; Drug Use: No History; Caffeine Use: Never; Financial Concerns: No; Food, Clothing or Shelter Needs: No; Support System Lacking: No; Transportation Concerns: No Physician Affirmation I have reviewed and agree with the above information. Electronic Signature(s) Signed: 05/06/2019 4:29:10 PM By: Montey Hora Signed: 05/06/2019 5:47:36 PM By: Worthy Keeler PA-C Entered By: Worthy Keeler on 05/06/2019 13:52:55 Adam Merritt ([id number]) -------------------------------------------------------------------------------- SuperBill Details Patient Name: Adam Merritt Date of Service: 05/06/2019 Medical Record Number: [id number] Patient Account Number: [id number] Date of Birth/Sex: 08-10-1945 (73 y.o. M) Treating RN: Montey Hora Primary Care Provider: [name] Other Clinician: Referring Provider: [name] Treating Provider/Extender: Melburn Hake, [name]: 7 Diagnosis Coding ICD-10 Codes Code Description I89.0 Lymphedema, not elsewhere classified E11.621 Type 2 diabetes mellitus with foot ulcer L97.512 Non-pressure chronic ulcer of other part of right foot with fat layer exposed L97.822 Non-pressure chronic ulcer of other part of left lower leg with fat layer exposed E66.09 Other obesity due to excess calories Facility Procedures CPT4 Code: 40973532 Description: 99213 - WOUND CARE VISIT-LEV 3 EST PT Modifier: Quantity: 1 Physician Procedures CPT4 Code Description: [id number] - WC PHYS LEVEL 3 - EST PT ICD-10 Diagnosis Description I89.0 Lymphedema,  not elsewhere classified E11.621 Type 2 diabetes mellitus with foot ulcer L97.512 Non-pressure chronic ulcer of other part of right foot with  fat L97.822 Non-pressure chronic ulcer of other part of left lower leg with Modifier: layer exposed fat layer expos Quantity: 1 ed Electronic Signature(s) Signed: 05/06/2019 4:29:10 PM By: Montey Hora Signed: 05/06/2019 5:47:36 PM By: Worthy Keeler PA-C Entered By: Montey Hora on 05/06/2019 14:02:47

## 2019-06-07 DIAGNOSIS — I509 Heart failure, unspecified: Secondary | ICD-10-CM | POA: Diagnosis not present

## 2019-07-03 ENCOUNTER — Telehealth: Payer: Self-pay

## 2019-07-03 NOTE — Telephone Encounter (Signed)
Confirmed virtual visit with patient. klh 

## 2019-07-07 ENCOUNTER — Encounter: Payer: Self-pay | Admitting: Nurse Practitioner

## 2019-07-07 ENCOUNTER — Ambulatory Visit (INDEPENDENT_AMBULATORY_CARE_PROVIDER_SITE_OTHER): Payer: Medicare PPO | Admitting: Nurse Practitioner

## 2019-07-07 VITALS — Ht 66.0 in | Wt 292.0 lb

## 2019-07-07 DIAGNOSIS — J449 Chronic obstructive pulmonary disease, unspecified: Secondary | ICD-10-CM

## 2019-07-07 DIAGNOSIS — I10 Essential (primary) hypertension: Secondary | ICD-10-CM | POA: Diagnosis not present

## 2019-07-07 DIAGNOSIS — E1165 Type 2 diabetes mellitus with hyperglycemia: Secondary | ICD-10-CM

## 2019-07-07 DIAGNOSIS — E1142 Type 2 diabetes mellitus with diabetic polyneuropathy: Secondary | ICD-10-CM | POA: Diagnosis not present

## 2019-07-07 NOTE — Progress Notes (Signed)
[facility] [place], [place] [address]  Internal MEDICINE  Telephone Visit  Patient Name: Adam Merritt  [id number]  [id number]  Date of Service: 07/07/2019  I connected with the patient at 11:21am by telephone and verified the patients identity using two identifiers.   I discussed the limitations, risks, security and privacy concerns of performing an evaluation and management service by telephone and the availability of in person appointments. I also discussed with the patient that there may be a patient responsible charge related to the service.  The patient expressed understanding and agrees to proceed.    Chief Complaint  Patient presents with  . Telephone Assessment  . Telephone Screen  . Diabetes    GLUCOSE 173  . Hypertension  . Gastroesophageal Reflux  . Hyperlipidemia  . Quality Metric Gaps    diabetic eye exam    The patient has been contacted via telephone for follow up visit due to concerns for spread of novel coronavirus. The patient presents for routine visit. The patient states that he is doing well right now. No new concerns or complaints. Might need refill for his gabapentin. States that the current dose is working well. He is controlling his pain quite well without narcotic pain medications at all. Gets eyes checked every year at [facility]. Did have this done int 2020, but does not remember the date.       Current Medication: Outpatient Encounter Medications as of 07/07/2019  Medication Sig  . albuterol (PROVENTIL HFA;VENTOLIN HFA) 108 (90 Base) MCG/ACT inhaler INHALE 1 PUFF INTO THE LUNGS EVERY 6 HOURS AS NEEDED FOR WHEEZING OR SHORTNESS OF BREATH (Patient taking differently: Inhale 1 puff into the lungs every 6 (six) hours as needed for wheezing or shortness of breath. )  . aspirin 325 MG EC tablet Take 325 mg by mouth daily.  Marland Kitchen atorvastatin (LIPITOR) 40 MG tablet Take 40 mg by mouth at bedtime.   . carvedilol (COREG) 25 MG tablet Take  25 mg by mouth 2 (two) times daily with a meal.  . docusate sodium (COLACE) 50 MG capsule Take 50 mg by mouth 2 (two) times daily.  . febuxostat (ULORIC) 40 MG tablet Take 1 tablet (40 mg total) by mouth daily.  . furosemide (LASIX) 40 MG tablet Take 1 tablet (40 mg total) by mouth daily.  Marland Kitchen gabapentin (NEURONTIN) 300 MG capsule Take 2 cap in the morning, 1 cap in the evening and 2 cap at night.  . hydrALAZINE (APRESOLINE) 25 MG tablet   . indapamide (LOZOL) 2.5 MG tablet Take 1 tablet (2.5 mg total) by mouth daily.  . Insulin Glargine (BASAGLAR KWIKPEN) 100 UNIT/ML SOPN INJECT 30 TO 36 UNITS UNDER THE SKIN EVERY DAY  . Insulin Pen Needle (BD PEN NEEDLE NANO U/F) 32G X 4 MM MISC Use as directed with insulin DX e11.65  . ipratropium-albuterol (DUONEB) 0.5-2.5 (3) MG/3ML SOLN Take 3 mLs by nebulization every 4 (four) hours as needed (for shortness of breath).  . metolazone (ZAROXOLYN) 2.5 MG tablet   . Multiple Vitamin (MULTIVITAMIN WITH MINERALS) TABS tablet Take 1 tablet by mouth daily.  . ondansetron (ZOFRAN) 4 MG tablet Take 1 tablet (4 mg total) by mouth every 8 (eight) hours as needed.  [name] 83J MISC Use twice daily diag e11.65  . pentoxifylline (TRENTAL) 400 MG CR tablet TAKE 1 TABLET BY MOUTH THREE TIMES DAILY FOR CLAUDICATION  . potassium chloride SA (KLOR-CON) 20 MEQ tablet Take 1 tablet (  20 mEq total) by mouth 2 (two) times daily.  . prazosin (MINIPRESS) 2 MG capsule Take 2 mg by mouth at bedtime.   . sennosides-docusate sodium (SENOKOT-S) 8.6-50 MG tablet Take 1-2 tablets by mouth 2 (two) times daily.  . sertraline (ZOLOFT) 100 MG tablet   . traZODone (DESYREL) 50 MG tablet Take 50 mg by mouth at bedtime.  Marland Kitchen VICTOZA 18 MG/3ML SOPN INJECT 1.8MG  INTO THE SKIN DAILY   No facility-administered encounter medications on file as of 07/07/2019.    Surgical History: Past Surgical History:  Procedure Laterality Date  . AMPUTATION TOE Right 06/23/2018   Procedure:  AMPUTATION TOE;  Surgeon: Sharlotte Alamo, DPM;  Location: [facility] ORS;  Service: Podiatry;  Laterality: Right;  . CHOLECYSTECTOMY    . CORONARY ARTERY BYPASS GRAFT      Medical History: Past Medical History:  Diagnosis Date  . Anemia   . CAD (coronary artery disease)   . Chest pain   . Coronary artery disease   . Diabetes mellitus without complication ([place])   . Diastolic CHF ([place])   . Esophageal reflux   . Herpes zoster without mention of complication   . Hyperlipidemia   . Hypertension   . Insomnia   . Lumbago   . Lymphedema   . Neuropathy in diabetes ([place])   . Obstructive chronic bronchitis without exacerbation ([place])   . Other malaise and fatigue   . Stroke ([place])   . Varicose veins     Family History: Family History  Problem Relation Age of Onset  . Diabetes Mother   . Hyperlipidemia Mother   . Hypertension Mother   . Diabetes Father   . Hyperlipidemia Father   . Hypertension Father   . CAD Father     Social History   Socioeconomic History  . Marital status: Married    Spouse name: Not on file  . Number of children: Not on file  . Years of education: Not on file  . Highest education level: Not on file  Occupational History  . Occupation: retired  Tobacco Use  . Smoking status: Never Smoker  . Smokeless tobacco: Never Used  Substance and Sexual Activity  . Alcohol use: No    Comment: quit alcohol 30 years ago  . Drug use: No  . Sexual activity: Not Currently  Other Topics Concern  . Not on file  Social History Narrative   Lives at home with wife, uses a wheelchair now   Social Determinants of Health   Financial Resource Strain: Low Risk   . Difficulty of Paying Living Expenses: Not hard at all  Food Insecurity: No Food Insecurity  . Worried About Charity fundraiser in the Last Year: Never true  . Ran Out of Food in the Last Year: Never true  Transportation Needs: No Transportation Needs  . Lack of Transportation (Medical): No  . Lack of  Transportation (Non-Medical): No  Physical Activity: Inactive  . Days of Exercise per Week: 0 days  . Minutes of Exercise per Session: 0 min  Stress: No Stress Concern Present  . Feeling of Stress : Only a little  Social Connections: Slightly Isolated  . Frequency of Communication with Friends and Family: More than three times a week  . Frequency of Social Gatherings with Friends and Family: More than three times a week  . Attends Religious Services: More than 4 times per year  . Active Member of Clubs or Organizations: No  . Attends Archivist Meetings:  Never  . Marital Status: Married  Human resources officer Violence: Not At Risk  . Fear of Current or Ex-Partner: No  . Emotionally Abused: No  . Physically Abused: No  . Sexually Abused: No      Review of Systems  Constitutional: Positive for fatigue. Negative for activity change.  HENT: Negative for congestion and postnasal drip.   Respiratory: Positive for shortness of breath. Negative for cough and chest tightness.        States that his breathing has been doing well   Cardiovascular: Positive for leg swelling. Negative for chest pain and palpitations.       Stable leg swelling.   Gastrointestinal: Negative for constipation, diarrhea, nausea and vomiting.  Endocrine: Negative for cold intolerance, heat intolerance, polydipsia and polyuria.       Blood sugars doing well, though slightly elevated today.  Musculoskeletal: Positive for arthralgias, back pain and myalgias.  Skin: Positive for rash and wound.       Patient scheduled to see his wound care specialist later today.   Allergic/Immunologic: Positive for environmental allergies.  Neurological: Negative for dizziness, light-headedness and headaches.  Hematological: Negative for adenopathy.  Psychiatric/Behavioral: Positive for dysphoric mood. Negative for agitation. The patient is nervous/anxious.        Improved. [facility]depression.     Today's Vitals   07/07/19 1026  Weight: 292 lb (132.5 kg)  Height: 5\' 6"  (1.676 m)   Body mass index is 47.13 kg/m.  Observation/Objective:  The patient is alert and oriented. He is pleasant and answering all questions appropriately. Breathing is non-labored. He is in no acute distress.   Assessment/Plan: 1. Type 2 diabetes mellitus with hyperglycemia, without long-term current use of insulin (HCC) Blood sugar a little high this morning, bot doing well overall. No changes made to diabetic medication today. Check HgbA1c at next, in-office visit.   2. Chronic obstructive pulmonary disease, unspecified COPD type ([place]) Currently well managed. Continue inhalers and respiratory medications as prescribed   3. Diabetic polyneuropathy associated with type 2 diabetes mellitus ([place]) Doing well on current dose gabapentin. Will refill as needed.   4. Essential hypertension Stable. Continue bp medication as prescribed.   General Counseling: Hadriel verbalizes understanding of the findings of today's phone visit and agrees with plan of treatment. I have discussed any further diagnostic evaluation that may be needed or ordered today. We also reviewed his medications today. he has been encouraged to call the office with any questions or concerns that should arise related to todays visit.   This patient was seen by [name] FNP Collaboration with Dr [name] as a part of collaborative care agreement   Time spent: 22 Minutes    Dr [name] Internal medicine

## 2019-07-08 DIAGNOSIS — I509 Heart failure, unspecified: Secondary | ICD-10-CM | POA: Diagnosis not present

## 2019-07-11 ENCOUNTER — Other Ambulatory Visit: Payer: Self-pay | Admitting: Nurse Practitioner

## 2019-07-11 ENCOUNTER — Telehealth: Payer: Self-pay

## 2019-07-11 DIAGNOSIS — J209 Acute bronchitis, unspecified: Secondary | ICD-10-CM

## 2019-07-11 MED ORDER — CEFUROXIME AXETIL 500 MG PO TABS: 500.0000 mg | ORAL_TABLET | Freq: Two times a day (BID) | ORAL | 0 refills | Status: DC

## 2019-07-11 NOTE — Telephone Encounter (Signed)
Sent prescription for ceftin to [facility]. This is twice daily for next 10 days. He should use respiratory medications as needed and as prescribed .

## 2019-07-11 NOTE — Telephone Encounter (Signed)
Pt  advised we send ceftin to [facility]

## 2019-07-11 NOTE — Progress Notes (Signed)
Sent prescription of ceftin 500mg  twice daily for 10 days to [facility]. Use inhalers and respiratory medications as needed and as prescribed .

## 2019-07-17 ENCOUNTER — Encounter: Payer: Medicare PPO | Attending: Physician Assistant | Admitting: Physician Assistant

## 2019-07-17 ENCOUNTER — Other Ambulatory Visit: Payer: Self-pay

## 2019-07-17 DIAGNOSIS — E11621 Type 2 diabetes mellitus with foot ulcer: Secondary | ICD-10-CM | POA: Diagnosis not present

## 2019-07-17 DIAGNOSIS — W19XXXA Unspecified fall, initial encounter: Secondary | ICD-10-CM | POA: Insufficient documentation

## 2019-07-17 DIAGNOSIS — Z6841 Body Mass Index (BMI) 40.0 and over, adult: Secondary | ICD-10-CM | POA: Diagnosis not present

## 2019-07-17 DIAGNOSIS — E11622 Type 2 diabetes mellitus with other skin ulcer: Secondary | ICD-10-CM | POA: Diagnosis not present

## 2019-07-17 DIAGNOSIS — L97822 Non-pressure chronic ulcer of other part of left lower leg with fat layer exposed: Secondary | ICD-10-CM | POA: Insufficient documentation

## 2019-07-17 DIAGNOSIS — Z8673 Personal history of transient ischemic attack (TIA), and cerebral infarction without residual deficits: Secondary | ICD-10-CM | POA: Diagnosis not present

## 2019-07-17 DIAGNOSIS — J449 Chronic obstructive pulmonary disease, unspecified: Secondary | ICD-10-CM | POA: Insufficient documentation

## 2019-07-17 DIAGNOSIS — M1990 Unspecified osteoarthritis, unspecified site: Secondary | ICD-10-CM | POA: Insufficient documentation

## 2019-07-17 DIAGNOSIS — I872 Venous insufficiency (chronic) (peripheral): Secondary | ICD-10-CM | POA: Insufficient documentation

## 2019-07-17 DIAGNOSIS — G4730 Sleep apnea, unspecified: Secondary | ICD-10-CM | POA: Diagnosis not present

## 2019-07-17 DIAGNOSIS — L97512 Non-pressure chronic ulcer of other part of right foot with fat layer exposed: Secondary | ICD-10-CM | POA: Insufficient documentation

## 2019-07-17 DIAGNOSIS — K5900 Constipation, unspecified: Secondary | ICD-10-CM | POA: Insufficient documentation

## 2019-07-17 DIAGNOSIS — G8929 Other chronic pain: Secondary | ICD-10-CM | POA: Diagnosis not present

## 2019-07-17 DIAGNOSIS — I890 Lymphedema, not elsewhere classified: Secondary | ICD-10-CM | POA: Diagnosis not present

## 2019-07-17 DIAGNOSIS — I2510 Atherosclerotic heart disease of native coronary artery without angina pectoris: Secondary | ICD-10-CM | POA: Insufficient documentation

## 2019-07-17 DIAGNOSIS — I509 Heart failure, unspecified: Secondary | ICD-10-CM | POA: Diagnosis not present

## 2019-07-17 DIAGNOSIS — E668 Other obesity: Secondary | ICD-10-CM | POA: Diagnosis not present

## 2019-07-17 DIAGNOSIS — S99921A Unspecified injury of right foot, initial encounter: Secondary | ICD-10-CM | POA: Diagnosis not present

## 2019-07-17 DIAGNOSIS — I110 Hypertensive heart disease with heart failure: Secondary | ICD-10-CM | POA: Insufficient documentation

## 2019-07-17 DIAGNOSIS — E1140 Type 2 diabetes mellitus with diabetic neuropathy, unspecified: Secondary | ICD-10-CM | POA: Insufficient documentation

## 2019-07-17 DIAGNOSIS — L97812 Non-pressure chronic ulcer of other part of right lower leg with fat layer exposed: Secondary | ICD-10-CM | POA: Diagnosis not present

## 2019-07-17 NOTE — Progress Notes (Signed)
Adam Merritt, Adam Merritt ([id number]) Visit Report for 07/17/2019 Abuse/Suicide Risk Screen Details Patient Name: Adam Merritt, Adam Merritt Date of Service: 07/17/2019 1:15 PM Medical Record Number: [id number] Patient Account Number: [id number] Date of Birth/Sex: 09-22-1945 (74 y.o. M) Treating RN: Montey Hora Primary Care Anela Bensman: [name] Other Clinician: Referring Keziyah Kneale: Referral, Self Treating Amily Depp/Extender: STONE III, [name]: 0 Abuse/Suicide Risk Screen Items Answer ABUSE RISK SCREEN: Has anyone close to you tried to hurt or harm you recentlyo No Do you feel uncomfortable with anyone in your familyo No Has anyone forced you do things that you didnot want to doo No Electronic Signature(s) Signed: 07/17/2019 4:40:05 PM By: Montey Hora Entered By: Montey Hora on 07/17/2019 13:21:07 Adam Merritt ([id number]) -------------------------------------------------------------------------------- Activities of Daily Living Details Patient Name: Adam Merritt Date of Service: 07/17/2019 1:15 PM Medical Record Number: [id number] Patient Account Number: [id number] Date of Birth/Sex: [birth year] (74 y.o. M) Treating RN: Montey Hora Primary Care Nella Botsford: [name] Other Clinician: Referring Braylyn Kalter: Referral, Self Treating Rilynn Habel/Extender: STONE III, [name]: 0 Activities of Daily Living Items Answer Activities of Daily Living (Please select one for each item) Drive Automobile Not Able Take Medications Completely Able Use Telephone Completely Able Care for Appearance Need Assistance Use Toilet Completely Able Bath / Shower Need Assistance Dress Self Need Assistance Feed Self Completely Able Walk Need Assistance Get In / Out Bed Need Assistance Housework Not Able Prepare Meals Not [place] for Self Need Assistance Electronic Signature(s) Signed: 07/17/2019 4:40:05 PM By: Montey Hora Entered By: Montey Hora  on 07/17/2019 13:21:40 Adam Merritt ([id number]) -------------------------------------------------------------------------------- Education Screening Details Patient Name: Adam Merritt Date of Service: 07/17/2019 1:15 PM Medical Record Number: [id number] Patient Account Number: [id number] Date of Birth/Sex: [birth year] (74 y.o. M) Treating RN: Montey Hora Primary Care Treylan Mcclintock: [name] Other Clinician: Referring Annaliz Aven: Referral, Self Treating Anan Dapolito/Extender: Melburn Hake, [name]: 0 Primary Learner Assessed: Patient Learning Preferences/Education Level/Primary Language Learning Preference: Explanation, Demonstration Highest Education Level: High School Preferred Language: English Cognitive Barrier Language Barrier: No Translator Needed: No Memory Deficit: No Emotional Barrier: No Cultural/Religious Beliefs Affecting Medical Care: No Physical Barrier Impaired Vision: No Impaired Hearing: No Decreased Hand dexterity: No Knowledge/Comprehension Knowledge Level: Medium Comprehension Level: Medium Ability to understand written Medium instructions: Ability to understand verbal Medium instructions: Motivation Anxiety Level: Calm Cooperation: Cooperative Education Importance: Acknowledges Need Interest in Health Problems: Asks Questions Perception: Coherent Willingness to Engage in Self- Medium Management Activities: Readiness to Engage in Self- Medium Management Activities: Electronic Signature(s) Signed: 07/17/2019 4:40:05 PM By: Montey Hora Entered By: Montey Hora on 07/17/2019 13:22:06 Adam Merritt ([id number]) -------------------------------------------------------------------------------- Fall Risk Assessment Details Patient Name: Adam Merritt Date of Service: 07/17/2019 1:15 PM Medical Record Number: [id number] Patient Account Number: [id number] Date of Birth/Sex: 1946-01-15 (74 y.o. M) Treating RN: Montey Hora Primary  Care Shaurya Rawdon: [name] Other Clinician: Referring Faryn Sieg: Referral, Self Treating Zandra Lajeunesse/Extender: Melburn Hake, [name]: 0 Fall Risk Assessment Items Have you had 2 or more falls in the last 12 monthso 0 Yes Have you had any fall that resulted in injury in the last 12 monthso 0 No FALLS RISK SCREEN History of falling - immediate or within 3 months 25 Yes Secondary diagnosis (Do you have 2 or more medical diagnoseso) 0 No Ambulatory aid None/bed rest/wheelchair/nurse 0 No Crutches/cane/walker 15 Yes Furniture 0 No Intravenous therapy Access/Saline/Heparin Lock 0 No Gait/Transferring Normal/ bed rest/ wheelchair 0 No Weak (  short steps with or without shuffle, stooped but able to lift head while 10 Yes walking, may seek support from furniture) Impaired (short steps with shuffle, may have difficulty arising from chair, head 0 No down, impaired balance) Mental Status Oriented to own ability 0 Yes Electronic Signature(s) Signed: 07/17/2019 4:40:05 PM By: Montey Hora Entered By: Montey Hora on 07/17/2019 13:22:36 Adam Merritt ([id number]) -------------------------------------------------------------------------------- Foot Assessment Details Patient Name: Adam Merritt Date of Service: 07/17/2019 1:15 PM Medical Record Number: [id number] Patient Account Number: [id number] Date of Birth/Sex: 01/18/1946 (74 y.o. M) Treating RN: Montey Hora Primary Care Pryce Folts: [name] Other Clinician: Referring Vertis Scheib: Referral, Self Treating Scotlynn Noyes/Extender: STONE III, [name]: 0 Foot Assessment Items Site Locations + = Sensation present, - = Sensation absent, C = Callus, U = Ulcer R = Redness, W = Warmth, M = Maceration, PU = Pre-ulcerative lesion F = Fissure, S = Swelling, D = Dryness Assessment Right: Left: Other Deformity: No No Prior Foot Ulcer: No No Prior Amputation: No No Charcot Joint: No No Ambulatory Status: Ambulatory With  Help Assistance Device: Walker Gait: Administrator, arts) Signed: 07/17/2019 4:40:05 PM By: Montey Hora Entered By: Montey Hora on 07/17/2019 13:22:56 Adam Merritt ([id number]) -------------------------------------------------------------------------------- Nutrition Risk Screening Details Patient Name: Adam Merritt Date of Service: 07/17/2019 1:15 PM Medical Record Number: [id number] Patient Account Number: [id number] Date of Birth/Sex: 1946-02-10 (74 y.o. M) Treating RN: Montey Hora Primary Care Chemeka Filice: [name] Other Clinician: Referring Kylii Ennis: Referral, Self Treating Shamell Suarez/Extender: STONE III, [name]: 0 Height (in): 66 Weight (lbs): 292 Body Mass Index (BMI): 47.1 Nutrition Risk Screening Items Score Screening NUTRITION RISK SCREEN: I have an illness or condition that made me change the kind and/or amount of 0 No food I eat I eat fewer than two meals per day 0 No I eat few fruits and vegetables, or milk products 0 No I have three or more drinks of beer, liquor or wine almost every day 0 No I have tooth or mouth problems that make it hard for me to eat 0 No I don't always have enough money to buy the food I need 0 No I eat alone most of the time 0 No I take three or more different prescribed or over-the-counter drugs a day 1 Yes Without wanting to, I have lost or gained 10 pounds in the last six months 0 No I am not always physically able to shop, cook and/or feed myself 0 No Nutrition Protocols Good Risk Protocol 0 No interventions needed Moderate Risk Protocol High Risk Proctocol Risk Level: Good Risk Score: 1 Electronic Signature(s) Signed: 07/17/2019 4:40:05 PM By: Montey Hora Entered By: Montey Hora on 07/17/2019 13:22:43

## 2019-07-17 NOTE — Progress Notes (Signed)
COHAN, STIPES ([id number]) Visit Report for 07/17/2019 Allergy List Details Patient Name: Adam Merritt, Adam Merritt Date of Service: 07/17/2019 1:15 PM Medical Record Number: [id number] Patient Account Number: [id number] Date of Birth/Sex: 02/28/1946 (74 y.o. M) Treating RN: Montey Hora Primary Care Livy Ross: [name] Other Clinician: Referring Zendaya Groseclose: Referral, Self Treating Shloime Keilman/Extender: STONE III, [name]: 0 Allergies Active Allergies Corticosteroids (Glucocorticoids) Allergy Notes Electronic Signature(s) Signed: 07/17/2019 4:40:05 PM By: Montey Hora Entered By: Montey Hora on 07/17/2019 13:19:31 Adam Merritt ([id number]) -------------------------------------------------------------------------------- Arrival Information Details Patient Name: Adam Merritt Date of Service: 07/17/2019 1:15 PM Medical Record Number: [id number] Patient Account Number: [id number] Date of Birth/Sex: 11-Jul-1945 (74 y.o. M) Treating RN: Army Melia Primary Care Rosemary Pentecost: [name] Other Clinician: Referring [place] Dowd: Referral, Self Treating Alpa Salvo/Extender: Melburn Hake, [name]: 0 Visit Information Patient Arrived: Walker Arrival Time: 13:04 Accompanied By: wife Transfer Assistance: None Patient Identification Verified: Yes Secondary Verification Process Completed: Yes History Since Last Visit Added or deleted any medications: No Any new allergies or adverse reactions: No Had a fall or experienced change in activities of daily living that may affect risk of falls: No Signs or symptoms of abuse/neglect since last visito No Hospitalized since last visit: No Implantable device outside of the clinic excluding cellular tissue based products placed in the center since last visit: No Has Compression in Place as Prescribed: Yes Electronic Signature(s) Signed: 07/17/2019 4:36:47 PM By: Lorine Bears RCP, RRT, CHT Entered By: Lorine Bears on 07/17/2019 13:05:38 Adam Merritt ([id number]) -------------------------------------------------------------------------------- Clinic Level of Care Assessment Details Patient Name: Adam Merritt Date of Service: 07/17/2019 1:15 PM Medical Record Number: [id number] Patient Account Number: [id number] Date of Birth/Sex: Aug 23, 1945 (74 y.o. M) Treating RN: Army Melia Primary Care Cleotilde Spadaccini: [name] Other Clinician: Referring Taneshia Lorence: Referral, Self Treating Lauriana Denes/Extender: STONE III, [name]: 0 Clinic Level of Care Assessment Items TOOL 1 Quantity Score []  - Use when EandM and Procedure is performed on INITIAL visit 0 ASSESSMENTS - Nursing Assessment / Reassessment X - General Physical Exam (combine w/ comprehensive assessment (listed just below) when 1 20 performed on new pt. evals) X- 1 25 Comprehensive Assessment (HX, ROS, Risk Assessments, Wounds Hx, etc.) ASSESSMENTS - Wound and Skin Assessment / Reassessment []  - Dermatologic / Skin Assessment (not related to wound area) 0 ASSESSMENTS - Ostomy and/or Continence Assessment and Care []  - Incontinence Assessment and Management 0 []  - 0 Ostomy Care Assessment and Management (repouching, etc.) PROCESS - Coordination of Care X - Simple Patient / Family Education for ongoing care 1 15 []  - 0 Complex (extensive) Patient / Family Education for ongoing care X- 1 10 Staff obtains Programmer, systems, Records, Test Results / Process Orders []  - 0 Staff telephones HHA, Nursing Homes / Clarify orders / etc []  - 0 Routine Transfer to another Facility (non-emergent condition) []  - 0 Routine Hospital Admission (non-emergent condition) X- 1 15 New Admissions / Biomedical engineer / Ordering NPWT, Apligraf, etc. []  - 0 Emergency Hospital Admission (emergent condition) PROCESS - Special Needs []  - Pediatric / Minor Patient Management 0 []  - 0 Isolation Patient Management []  - 0 Hearing /  Language / Visual special needs []  - 0 Assessment of Community assistance (transportation, D/C planning, etc.) []  - 0 Additional assistance / Altered mentation []  - 0 Support Surface(s) Assessment (bed, cushion, seat, etc.) Adam Merritt, Adam Merritt ([id number]) INTERVENTIONS - Miscellaneous []  - External ear exam 0 []  - 0 Patient Transfer (multiple staff /  Hoyer Lift / Similar devices) []  - 0 Simple Staple / Suture removal (25 or less) []  - 0 Complex Staple / Suture removal (26 or more) []  - 0 Hypo/Hyperglycemic Management (do not check if billed separately) []  - 0 Ankle / Brachial Index (ABI) - do not check if billed separately Has the patient been seen at the hospital within the last three years: Yes Total Score: 85 Level Of Care: New/Established - Level 3 Electronic Signature(s) Signed: 07/17/2019 4:09:28 PM By: Army Melia Entered By: Army Melia on 07/17/2019 14:18:24 Adam Merritt ([id number]) -------------------------------------------------------------------------------- Encounter Discharge Information Details Patient Name: Adam Merritt Date of Service: 07/17/2019 1:15 PM Medical Record Number: [id number] Patient Account Number: [id number] Date of Birth/Sex: Aug 08, 1945 (74 y.o. M) Treating RN: Army Melia Primary Care Khloee Garza: [name] Other Clinician: Referring Storie Heffern: Referral, Self Treating Woodrow Drab/Extender: STONE III, [name]: 0 Encounter Discharge Information Items Post Procedure Vitals Discharge Condition: Stable Temperature (F): 98.5 Ambulatory Status: Walker Pulse (bpm): 78 Discharge Destination: Home Respiratory Rate (breaths/min): 16 Transportation: Private Auto Blood Pressure (mmHg): 130/66 Accompanied By: wife Schedule Follow-up Appointment: Yes Clinical Summary of Care: Electronic Signature(s) Signed: 07/17/2019 4:09:28 PM By: Army Melia Entered By: Army Melia on 07/17/2019 14:20:00 Adam Merritt  ([id number]) -------------------------------------------------------------------------------- Lower Extremity Assessment Details Patient Name: Adam Merritt Date of Service: 07/17/2019 1:15 PM Medical Record Number: [id number] Patient Account Number: [id number] Date of Birth/Sex: 08/17/1945 (74 y.o. M) Treating RN: Montey Hora Primary Care Stormi Vandevelde: [name] Other Clinician: Referring Tayshaun Kroh: Referral, Self Treating Deion Forgue/Extender: STONE III, [name]: 0 Edema Assessment Assessed: [Left: No] [Right: No] Edema: [Left: Yes] [Right: Yes] Calf Left: Right: Point of Measurement: 34 cm From Medial Instep 56.5 cm 55 cm Ankle Left: Right: Point of Measurement: 12 cm From Medial Instep 38.5 cm 30 cm Vascular Assessment Pulses: Dorsalis Pedis Palpable: [Left:Yes] [Right:Yes] Posterior Tibial Palpable: [Left:Yes] [Right:Yes] Notes ABI from [facility] 10/22/17 L 1.15 TBI .66; R .98 TBI .61 Electronic Signature(s) Signed: 07/17/2019 4:40:05 PM By: Montey Hora Entered By: Montey Hora on 07/17/2019 13:34:10 Adam Merritt ([id number]) -------------------------------------------------------------------------------- Multi Wound Chart Details Patient Name: Adam Merritt Date of Service: 07/17/2019 1:15 PM Medical Record Number: [id number] Patient Account Number: [id number] Date of Birth/Sex: [birth year] (74 y.o. M) Treating RN: Army Melia Primary Care Janeese Mcgloin: [name] Other Clinician: Referring Kolson Chovanec: Referral, Self Treating Paxtyn Boyar/Extender: STONE III, [name]: 0 Vital Signs Height(in): 93 Pulse(bpm): 14 Weight(lbs): 292 Blood Pressure(mmHg): 130/66 Body Mass Index(BMI): 47 Temperature(F): 98.5 Respiratory Rate 18 (breaths/min): Photos: [26:No Photos] [27:No Photos] [28:No Photos] Wound Location: [26:Right Lower Leg - Lateral] [27:Left Lower Leg - Lateral] [28:Left Lower Leg - Anterior] Wounding Event: [26:Gradually Appeared]  [27:Gradually Appeared] [28:Gradually Appeared] Primary Etiology: [26:Lymphedema] [27:Lymphedema] [28:Lymphedema] Comorbid History: [26:Anemia, Lymphedema, Chronic Obstructive Pulmonary Disease (COPD), Sleep Apnea, Congestive Heart Failure, Coronary Artery Disease, Hypertension, Peripheral Venous Disease, Type II Diabetes, Osteoarthritis, Neuropathy, Received  Chemotherapy] [27:Anemia, Lymphedema, Chronic Obstructive Pulmonary Disease (COPD), Sleep Apnea, Congestive Heart Failure, Coronary Artery Disease, Hypertension, Peripheral Venous Disease, Type II Diabetes, Osteoarthritis, Neuropathy, Received  Chemotherapy] [28:Anemia, Lymphedema, Chronic Obstructive Pulmonary Disease (COPD), Sleep Apnea, Congestive Heart Failure, Coronary Artery Disease, Hypertension, Peripheral Venous Disease, Type II Diabetes, Osteoarthritis, Neuropathy, Received  Chemotherapy] Date Acquired: [26:07/07/2019] [27:07/07/2019] [28:07/07/2019] Weeks of Treatment: [26:0] [27:0] [28:0] Wound Status: [26:Open] [27:Open] [28:Open] Measurements L x W x D [26:9.5x7.5x0.1] [27:1.5x3.5x0.1] [28:6x10x0.1] (cm) Area (cm) : [26:55.96] [29:4.765] [46:50.354] Volume (cm) : [26:5.596] [27:0.412] [28:4.712] Classification: [26:Full Thickness Without Exposed  Support Structures] [27:Full Thickness Without Exposed Support Structures] [28:Full Thickness Without Exposed Support Structures] Exudate Amount: [26:Large] [27:Large] [28:Large] Exudate Type: [26:Serous] [27:Serous] [28:Serous] Exudate Color: [26:amber] [27:amber] [28:amber] Wound Margin: [26:Indistinct, nonvisible] [27:Flat and Intact] [28:Flat and Intact] Granulation Amount: [26:Medium (34-66%)] [27:Medium (34-66%)] [28:Medium (34-66%)] Granulation Quality: [26:Pink] [27:Pink] [28:Pink] Necrotic Amount: [26:Medium (34-66%)] [27:Medium (34-66%)] [28:Medium (34-66%)] Exposed Structures: [26:Fat Layer (Subcutaneous Tissue) Exposed: Yes Fascia: No Tendon: No Muscle: No Joint: No Bone: No]  [27:Fat Layer (Subcutaneous Tissue) Exposed: Yes Fascia: No Tendon: No Muscle: No Joint: No Bone: No] [28:Fat Layer (Subcutaneous Tissue) Exposed: Yes  Fascia: No Tendon: No Muscle: No Joint: No Bone: No] Epithelialization: Small (1-33%) Small (1-33%) None Wound Number: 29 N/A N/A Photos: No Photos N/A N/A Wound Location: Left Lower Leg - Anterior, N/A N/A Distal Wounding Event: Gradually Appeared N/A N/A Primary Etiology: Diabetic Wound/Ulcer of the N/A N/A Lower Extremity Comorbid History: Anemia, Lymphedema, N/A N/A Chronic Obstructive Pulmonary Disease (COPD), Sleep Apnea, Congestive Heart Failure, Coronary Artery Disease, Hypertension, Peripheral Venous Disease, Type II Diabetes, Osteoarthritis, Neuropathy, Received Chemotherapy Date Acquired: 07/17/2019 N/A N/A Weeks of Treatment: 0 N/A N/A Wound Status: Open N/A N/A Measurements L x W x D 0.4x0.3x0.6 N/A N/A (cm) Area (cm) : 0.094 N/A N/A Volume (cm) : 0.057 N/A N/A Classification: Grade 1 N/A N/A Exudate Amount: Medium N/A N/A Exudate Type: Serous N/A N/A Exudate Color: amber N/A N/A Wound Margin: Flat and Intact N/A N/A Granulation Amount: Small (1-33%) N/A N/A Granulation Quality: Pink N/A N/A Necrotic Amount: Large (67-100%) N/A N/A Exposed Structures: Fat Layer (Subcutaneous N/A N/A Tissue) Exposed: Yes Fascia: No Tendon: No Muscle: No Joint: No Bone: No Epithelialization: None N/A N/A Treatment Notes Electronic Signature(s) Signed: 07/17/2019 4:09:28 PM By: Army Melia Entered By: Army Melia on 07/17/2019 14:09:26 Adam Merritt ([id number]) -------------------------------------------------------------------------------- [place] Details Patient Name: Adam Merritt Date of Service: 07/17/2019 1:15 PM Medical Record Number: [id number] Patient Account Number: [id number] Date of Birth/Sex: 1946/03/18 (74 y.o. M) Treating RN: Army Melia Primary Care Shimika Ames: [name] Other  Clinician: Referring Fidela Cieslak: Referral, Self Treating Nena Hampe/Extender: Melburn Hake, [name]: 0 Active Inactive Abuse / Safety / Falls / Self Care Management Nursing Diagnoses: History of Falls Potential for falls Goals: Patient/caregiver will demonstrate safe use of adaptive devices to increase mobility Date Initiated: 07/17/2019 Target Resolution Date: 08/15/2019 Goal Status: Active Interventions: Provide education on fall prevention Notes: Orientation to the Wound Care Program Nursing Diagnoses: Knowledge deficit related to the wound healing center program Goals: Patient/caregiver will verbalize understanding of the [facility] Program Date Initiated: 07/17/2019 Target Resolution Date: 08/15/2019 Goal Status: Active Interventions: Provide education on orientation to the wound center Notes: Wound/Skin Impairment Nursing Diagnoses: Impaired tissue integrity Goals: Ulcer/skin breakdown will have a volume reduction of 30% by week 4 Date Initiated: 07/17/2019 Target Resolution Date: 08/15/2019 Goal Status: Active Interventions: Adam Merritt, Adam Merritt ([id number]) Assess ulceration(s) every visit Notes: Electronic Signature(s) Signed: 07/17/2019 4:09:28 PM By: Army Melia Entered By: Army Melia on 07/17/2019 14:09:05 Adam Merritt ([id number]) -------------------------------------------------------------------------------- Pain Assessment Details Patient Name: Adam Merritt Date of Service: 07/17/2019 1:15 PM Medical Record Number: [id number] Patient Account Number: [id number] Date of Birth/Sex: February 10, 1946 (74 y.o. M) Treating RN: Army Melia Primary Care Zev Blue: [name] Other Clinician: Referring Caroleann Casler: Referral, Self Treating Fitzpatrick Alberico/Extender: STONE III, [name]: 0 Active Problems Location of Pain Severity and Description of Pain Patient Has Paino Yes Site Locations Rate the pain. Current Pain Level:  2 Pain Management  and Medication Current Pain Management: Electronic Signature(s) Signed: 07/17/2019 4:09:28 PM By: Army Melia Signed: 07/17/2019 4:36:47 PM By: Becky Sax, Sallie RCP, RRT, CHT Entered By: Lorine Bears on 07/17/2019 13:05:49 Adam Merritt ([id number]) -------------------------------------------------------------------------------- Patient/Caregiver Education Details Patient Name: Adam Merritt Date of Service: 07/17/2019 1:15 PM Medical Record Number: [id number] Patient Account Number: [id number] Date of Birth/Gender: 21-Jul-1945 (74 y.o. M) Treating RN: Army Melia Primary Care Physician: [name] Other Clinician: Referring Physician: Referral, Self Treating Physician/Extender: Melburn Hake, [name]: 0 Education Assessment Education Provided To: Patient Education Topics Provided Wound/Skin Impairment: Handouts: Caring for Your Ulcer Methods: Demonstration, Explain/Verbal Responses: State content correctly Electronic Signature(s) Signed: 07/17/2019 4:09:28 PM By: Army Melia Entered By: Army Melia on 07/17/2019 14:18:40 Adam Merritt ([id number]) -------------------------------------------------------------------------------- Wound Assessment Details Patient Name: Adam Merritt Date of Service: 07/17/2019 1:15 PM Medical Record Number: [id number] Patient Account Number: [id number] Date of Birth/Sex: [birth year] (74 y.o. M) Treating RN: Montey Hora Primary Care Urian Martenson: [name] Other Clinician: Referring Katniss Weedman: Referral, Self Treating Mayline Dragon/Extender: STONE III, [name]: 0 Wound Status Wound Number: 26 Primary Lymphedema Etiology: Wound Location: Right Lower Leg - Lateral Wound Open Wounding Event: Gradually Appeared Status: Date Acquired: 07/07/2019 Comorbid Anemia, Lymphedema, Chronic Obstructive Weeks Of Treatment: 0 History: Pulmonary Disease (COPD), Sleep Apnea, Clustered Wound: No Congestive  Heart Failure, Coronary Artery Disease, Hypertension, Peripheral Venous Disease, Type II Diabetes, Osteoarthritis, Neuropathy, Received Chemotherapy Photos Photo Uploaded By: Montey Hora on 07/17/2019 16:03:16 Wound Measurements Length: (cm) 9.5 Width: (cm) 7.5 Depth: (cm) 0.1 Area: (cm) 55.96 Volume: (cm) 5.596 % Reduction in Area: % Reduction in Volume: Epithelialization: Small (1-33%) Tunneling: No Undermining: No Wound Description Full Thickness Without Exposed Support Classification: Structures Wound Margin: Indistinct, nonvisible Exudate Large Amount: Exudate Type: Serous Exudate Color: amber Foul Odor After Cleansing: No Slough/Fibrino Yes Wound Bed Granulation Amount: Medium (34-66%) Exposed Structure Granulation Quality: Pink Fascia Exposed: No Necrotic Amount: Medium (34-66%) Fat Layer (Subcutaneous Tissue) Exposed: Yes Necrotic Quality: Adherent Slough Tendon Exposed: No Adam Merritt, Adam Merritt ([id number]) Muscle Exposed: No Joint Exposed: No Bone Exposed: No Treatment Notes Wound #26 (Right, Lateral Lower Leg) Notes silver cell, xtrasorb, 4 layer bilateral Electronic Signature(s) Signed: 07/17/2019 4:40:05 PM By: Montey Hora Entered By: Montey Hora on 07/17/2019 13:41:23 Adam Merritt ([id number]) -------------------------------------------------------------------------------- Wound Assessment Details Patient Name: Adam Merritt Date of Service: 07/17/2019 1:15 PM Medical Record Number: [id number] Patient Account Number: [id number] Date of Birth/Sex: [birth year] (74 y.o. M) Treating RN: Montey Hora Primary Care Cameka Rae: [name] Other Clinician: Referring Alta Goding: Referral, Self Treating Odai Wimmer/Extender: STONE III, [name]: 0 Wound Status Wound Number: 27 Primary Lymphedema Etiology: Wound Location: Left Lower Leg - Lateral Wound Open Wounding Event: Gradually Appeared Status: Date Acquired: 07/07/2019 Comorbid  Anemia, Lymphedema, Chronic Obstructive Weeks Of Treatment: 0 History: Pulmonary Disease (COPD), Sleep Apnea, Clustered Wound: No Congestive Heart Failure, Coronary Artery Disease, Hypertension, Peripheral Venous Disease, Type II Diabetes, Osteoarthritis, Neuropathy, Received Chemotherapy Photos Photo Uploaded By: Montey Hora on 07/17/2019 16:03:17 Wound Measurements Length: (cm) 1.5 Width: (cm) 3.5 Depth: (cm) 0.1 Area: (cm) 4.123 Volume: (cm) 0.412 % Reduction in Area: % Reduction in Volume: Epithelialization: Small (1-33%) Tunneling: No Undermining: No Wound Description Full Thickness Without Exposed Support Classification: Structures Wound Margin: Flat and Intact Exudate Large Amount: Exudate Type: Serous Exudate Color: amber Foul Odor After Cleansing: No Slough/Fibrino Yes Wound Bed Granulation Amount: Medium (34-66%) Exposed Structure Granulation Quality: Pink Fascia  Exposed: No Necrotic Amount: Medium (34-66%) Fat Layer (Subcutaneous Tissue) Exposed: Yes Necrotic Quality: Adherent Slough Tendon Exposed: No Adam Merritt, Adam Merritt ([id number]) Muscle Exposed: No Joint Exposed: No Bone Exposed: No Treatment Notes Wound #27 (Left, Lateral Lower Leg) Notes silver cell, xtrasorb, 4 layer bilateral Electronic Signature(s) Signed: 07/17/2019 4:40:05 PM By: Montey Hora Entered By: Montey Hora on 07/17/2019 13:42:24 Adam Merritt ([id number]) -------------------------------------------------------------------------------- Wound Assessment Details Patient Name: Adam Merritt Date of Service: 07/17/2019 1:15 PM Medical Record Number: [id number] Patient Account Number: [id number] Date of Birth/Sex: [birth year] (74 y.o. M) Treating RN: Montey Hora Primary Care Khiem Gargis: [name] Other Clinician: Referring Aydian Dimmick: Referral, Self Treating Zamyia Gowell/Extender: STONE III, [name]: 0 Wound Status Wound Number: 28 Primary  Lymphedema Etiology: Wound Location: Left Lower Leg - Anterior Wound Open Wounding Event: Gradually Appeared Status: Date Acquired: 07/07/2019 Comorbid Anemia, Lymphedema, Chronic Obstructive Weeks Of Treatment: 0 History: Pulmonary Disease (COPD), Sleep Apnea, Clustered Wound: No Congestive Heart Failure, Coronary Artery Disease, Hypertension, Peripheral Venous Disease, Type II Diabetes, Osteoarthritis, Neuropathy, Received Chemotherapy Photos Photo Uploaded By: Montey Hora on 07/17/2019 16:03:53 Wound Measurements Length: (cm) 6 Width: (cm) 10 Depth: (cm) 0.1 Area: (cm) 47.124 Volume: (cm) 4.712 % Reduction in Area: % Reduction in Volume: Epithelialization: None Tunneling: No Undermining: No Wound Description Full Thickness Without Exposed Support Classification: Structures Wound Margin: Flat and Intact Exudate Large Amount: Exudate Type: Serous Exudate Color: amber Foul Odor After Cleansing: No Slough/Fibrino Yes Wound Bed Granulation Amount: Medium (34-66%) Exposed Structure Granulation Quality: Pink Fascia Exposed: No Necrotic Amount: Medium (34-66%) Fat Layer (Subcutaneous Tissue) Exposed: Yes Necrotic Quality: Adherent Slough Tendon Exposed: No Adam Merritt, Adam Merritt ([id number]) Muscle Exposed: No Joint Exposed: No Bone Exposed: No Treatment Notes Wound #28 (Left, Anterior Lower Leg) Notes silver cell, xtrasorb, 4 layer bilateral Electronic Signature(s) Signed: 07/17/2019 4:40:05 PM By: Montey Hora Entered By: Montey Hora on 07/17/2019 13:43:23 Adam Merritt ([id number]) -------------------------------------------------------------------------------- Wound Assessment Details Patient Name: Adam Merritt Date of Service: 07/17/2019 1:15 PM Medical Record Number: [id number] Patient Account Number: [id number] Date of Birth/Sex: 1946/03/10 (74 y.o. M) Treating RN: Montey Hora Primary Care Dee Maday: [name] Other Clinician: Referring  Priti Consoli: Referral, Self Treating Sylus Stgermain/Extender: STONE III, [name]: 0 Wound Status Wound Number: 29 Primary Diabetic Wound/Ulcer of the Lower Extremity Etiology: Wound Location: Left Lower Leg - Anterior, Distal Wound Open Wounding Event: Gradually Appeared Status: Date Acquired: 07/17/2019 Comorbid Anemia, Lymphedema, Chronic Obstructive Weeks Of Treatment: 0 History: Pulmonary Disease (COPD), Sleep Apnea, Clustered Wound: No Congestive Heart Failure, Coronary Artery Disease, Hypertension, Peripheral Venous Disease, Type II Diabetes, Osteoarthritis, Neuropathy, Received Chemotherapy Photos Photo Uploaded By: Montey Hora on 07/17/2019 16:03:53 Wound Measurements Length: (cm) 0.4 Width: (cm) 0.3 Depth: (cm) 0.6 Area: (cm) 0.094 Volume: (cm) 0.057 % Reduction in Area: % Reduction in Volume: Epithelialization: None Tunneling: No Undermining: No Wound Description Classification: Grade 1 Wound Margin: Flat and Intact Exudate Amount: Medium Exudate Type: Serous Exudate Color: amber Foul Odor After Cleansing: No Slough/Fibrino Yes Wound Bed Granulation Amount: Small (1-33%) Exposed Structure Granulation Quality: Pink Fascia Exposed: No Necrotic Amount: Large (67-100%) Fat Layer (Subcutaneous Tissue) Exposed: Yes Necrotic Quality: Adherent Slough Tendon Exposed: No Muscle Exposed: No Joint Exposed: No Adam Merritt, Adam Merritt ([id number]) Bone Exposed: No Treatment Notes Wound #29 (Left, Distal, Anterior Lower Leg) Notes silver cell, xtrasorb, 4 layer bilateral Electronic Signature(s) Signed: 07/17/2019 4:40:05 PM By: Montey Hora Entered By: Montey Hora on 07/17/2019 13:44:39 Adam Merritt ([id number]) -------------------------------------------------------------------------------- Wound  Assessment Details Patient Name: Adam Merritt, Adam Merritt Date of Service: 07/17/2019 1:15 PM Medical Record Number: [id number] Patient Account Number:  [id number] Date of Birth/Sex: August 14, 1945 (74 y.o. M) Treating RN: Army Melia Primary Care Faydra Korman: [name] Other Clinician: Referring Juancarlos Crescenzo: Referral, Self Treating Devanny Palecek/Extender: STONE III, [name]: 0 Wound Status Wound Number: 30 Primary Diabetic Wound/Ulcer of the Lower Extremity Etiology: Wound Location: Right Toe Second Wound Open Wounding Event: Gradually Appeared Status: Date Acquired: 06/06/2019 Comorbid Anemia, Lymphedema, Chronic Obstructive Weeks Of Treatment: 0 History: Pulmonary Disease (COPD), Sleep Apnea, Clustered Wound: No Congestive Heart Failure, Coronary Artery Disease, Hypertension, Peripheral Venous Disease, Type II Diabetes, Osteoarthritis, Neuropathy, Received Chemotherapy Wound Measurements Length: (cm) 0.5 Width: (cm) 0.5 Depth: (cm) 0.1 Area: (cm) 0.196 Volume: (cm) 0.02 % Reduction in Area: % Reduction in Volume: Epithelialization: None Tunneling: No Undermining: No Wound Description Classification: Grade 2 Wound Margin: Flat and Intact Exudate Amount: Medium Exudate Type: Serosanguineous Exudate Color: red, brown Foul Odor After Cleansing: No Slough/Fibrino No Wound Bed Granulation Amount: Large (67-100%) Exposed Structure Granulation Quality: Red Fascia Exposed: No Necrotic Amount: None Present (0%) Fat Layer (Subcutaneous Tissue) Exposed: Yes Tendon Exposed: No Muscle Exposed: No Joint Exposed: No Bone Exposed: No Treatment Notes Wound #30 (Right Toe Second) Notes silver cell, coverlet Electronic Signature(s) Signed: 07/17/2019 4:09:28 PM By: Army Melia Entered By: Army Melia on 07/17/2019 14:11:48 Adam Merritt ([id number]) Adam Merritt, Adam Merritt ([id number]) -------------------------------------------------------------------------------- Vitals Details Patient Name: Adam Merritt Date of Service: 07/17/2019 1:15 PM Medical Record Number: [id number] Patient Account Number: [id number] Date of  Birth/Sex: 12/17/1945 (74 y.o. M) Treating RN: Army Melia Primary Care Margit Batte: [name] Other Clinician: Referring Audrie Kuri: Referral, Self Treating Aidel Davisson/Extender: STONE III, [name]: 0 Vital Signs Time Taken: 13:05 Temperature (F): 98.5 Height (in): 66 Pulse (bpm): 78 Source: Stated Respiratory Rate (breaths/min): 18 Weight (lbs): 292 Blood Pressure (mmHg): 130/66 Source: Stated Reference Range: 80 - 120 mg / dl Body Mass Index (BMI): 47.1 Electronic Signature(s) Signed: 07/17/2019 4:36:47 PM By: Lorine Bears RCP, RRT, CHT Entered By: Lorine Bears on 07/17/2019 13:10:24

## 2019-07-17 NOTE — Progress Notes (Addendum)
EYOEL, THROGMORTON ([id number]) Visit Report for 07/17/2019 Chief Complaint Document Details Patient Name: DIETRICK, BARRIS Date of Service: 07/17/2019 1:15 PM Medical Record Number: [id number] Patient Account Number: [id number] Date of Birth/Sex: [birth year] (74 y.o. M) Treating RN: Army Melia Primary Care Provider: [name] Other Clinician: Referring Provider: Referral, Self Treating Provider/Extender: Melburn Hake, Melisa Donofrio [name]: 0 Information Obtained from: Patient Chief Complaint Bilateral LE Ulcers and right second toe ulcer Electronic Signature(s) Signed: 07/17/2019 2:24:36 PM By: Worthy Keeler PA-C Previous Signature: 07/17/2019 1:50:23 PM Version By: Worthy Keeler PA-C Entered By: Worthy Keeler on 07/17/2019 14:24:36 [name] ([id number]) -------------------------------------------------------------------------------- Debridement Details Patient Name: [name] Date of Service: 07/17/2019 1:15 PM Medical Record Number: [id number] Patient Account Number: [id number] Date of Birth/Sex: 1945/12/18 (74 y.o. M) Treating RN: Army Melia Primary Care Provider: [name] Other Clinician: Referring Provider: Referral, Self Treating Provider/Extender: STONE III, Yarely Bebee [name]: 0 Debridement Performed for Wound #30 Right Toe Second Assessment: Performed By: Physician STONE III, Erva Koke E., PA-C Debridement Type: Debridement Severity of Tissue Pre Fat layer exposed Debridement: Level of Consciousness (Pre- Awake and Alert procedure): Pre-procedure Verification/Time Yes - 14:12 Out Taken: Start Time: 14:12 Pain Control: Lidocaine Total Area Debrided (L x W): 1.5 (cm) x 1.5 (cm) = 2.25 (cm) Tissue and other material Viable, Non-Viable, Subcutaneous debrided: Level: Skin/Subcutaneous Tissue Debridement Description: Excisional Instrument: Curette Bleeding: None End Time: 14:13 Response to Treatment: Procedure was tolerated well Level of  Consciousness Awake and Alert (Post-procedure): Post Debridement Measurements of Total Wound Length: (cm) 0.5 Width: (cm) 0.5 Depth: (cm) 0.1 Volume: (cm) 0.02 Character of Wound/Ulcer Post Debridement: Stable Severity of Tissue Post Debridement: Fat layer exposed Post Procedure Diagnosis Same as Pre-procedure Electronic Signature(s) Signed: 07/17/2019 4:09:28 PM By: Army Melia Signed: 07/17/2019 6:17:56 PM By: Worthy Keeler PA-C Entered By: Army Melia on 07/17/2019 14:12:44 [name] ([id number]) -------------------------------------------------------------------------------- HPI Details Patient Name: [name] Date of Service: 07/17/2019 1:15 PM Medical Record Number: [id number] Patient Account Number: [id number] Date of Birth/Sex: June 13, 1945 (74 y.o. M) Treating RN: Army Melia Primary Care Provider: [name] Other Clinician: Referring Provider: Referral, Self Treating Provider/Extender: Melburn Hake, Sharnelle Cappelli [name]: 0 History of Present Illness HPI Description: 10/18/17-He is here for initial evaluation of bilateral lower extremity ulcerations in the presence of venous insufficiency and lymphedema. He has been seen by vascular medicine in the past, Dr. Lucky Cowboy, last seen in 2016. He does have a history of abnormal ABIs, which is to be expected given his lymphedema and venous insufficiency. According to Epic, it appears that all attempts for arterial evaluation and/or angiography were not follow through with by patient. He does have a history of being seen in lymphedema clinic in 2018, stopped going approximately 6 months ago stating "it didn't do any good". He does not have lymphedema pumps, he does not have custom fit compression wrap/stockings. He is diabetic and his recent A1c last month was 7.6. He admits to chronic bilateral lower extremity pain, no change in pain since blister and ulceration development. He is currently being treated with Levaquin for  bronchitis. He has home health and we will continue. 10/25/17-He is here in follow-up evaluation for bilateral lower extremity ulcerationssubtle he remains on Levaquin for bronchitis. Right lower extremity with no evidence of drainage or ulceration, persistent left lower extremity ulceration. He states that home health has not been out since his appointment. He went to [facility] on Tuesday,  studies revealed: RIGHT ABI 0.9, TBI 0.6 LEFT ABI 1.1, TBI 0.6 with triphasic flow bilaterally. We will continue his same treatment plan. He has been educated on compression therapy and need for elevation. He will benefit from lymphedema pumps 11/01/17-He is here in follow-up evaluation for left lower extremity ulcer. The right lower extremity remains healed. He has home health services, but they have not been out to see the patient for 2-3 weeks. He states it home health physical therapy changed his dressing yesterday after therapy; he placed Ace wrap compression. We are still waiting for lymphedema pumps, reordered d/t need for company change. 11/08/17-He is here in follow-up evaluation for left lower extremity ulcer. It is improved. Edema is significantly improved with compression therapy. We will continue with same treatment plan and he will follow-up next week. No word regarding lymphedema pumps 11/15/17-He is here in follow-up evaluation for left lower extremity ulcer. He is healed and will be discharged from wound care services. I have reached out to [facility] regarding his lymphedema pumps. They have been unable to reach the patient; the contact number they had with the patient's wife's cell phone and she has not answered any unrecognized calls. Contact should be made today, trial planned for next week; [facility] will continue to follow 11/27/17 on evaluation today patient has multiple blistered areas over the right lower extremity his left lower extremity appears to be doing  okay. These blistered areas show signs of no infection which is great news. With that being said he did have some necrotic skin overlying which was mechanically debrided away with saline and gauze today without complication. Overall post debridement the wounds appear to be doing better but in general his swelling seems to be increased. This is obviously not good news. I think this is what has given rise to the blisters. 12/04/17 on evaluation today patient presents for follow-up concerning his bilateral lower extremity edema in the right lower extremity ulcers. He has been tolerating the dressing changes without complication. With that being said he has had no real issues with the wraps which is also good news. Overall I'm pleased with the progress he's been making. 12/11/17 on evaluation today patient appears to be doing rather well in regard to his right lateral lower extremity ulcer. He's been tolerating the dressing changes without complication. Fortunately there does not appear to be any evidence of infection at this time. Overall I'm pleased with the progress that is being made. Unfortunately he has been in the hospital due to having what sounds to be a stomach virus/flu fortunately that is starting to get better. 12/18/17 on evaluation today patient actually appears to be doing very well in regard to his bilateral lower extremities the swelling is under fairly good control his lymphedema pumps are still not up and running quite yet. With that being said he does have several areas of opening noted as far as wounds are concerned mainly over the left lower extremity. With that being said I do believe once he gets lymphedema pumps this would least help you mention some the fluid and preventing this from occurring. Hopefully that will be set up soon sleeves are Artie in place at his home he just waiting for the machine. 12/25/17 on evaluation today patient actually appears to be doing excellent in fact  all of his ulcers appear to have resolved his ABRAHAM, ENTWISTLE. ([id number]) legs appear very well. I do think he needs compression stockings we have discussed this and they  are actually going to go to [place] today to elastic therapy to get this fitted for him. I think that is definitely a good thing to do. Readmission: 04/09/18 upon evaluation today this patient is seen for readmission due to bilateral lower extremity lymphedema. He has significant swelling of his extremities especially on the left although the right is also swollen he has weeping from both sides. There are no obvious open wounds at this point. Fortunately he has been doing fairly well for quite a bit of time since I last saw him. Nonetheless unfortunately this seems to have reopened and is giving quite a bit of trouble. He states this began about a week ago when he first called [place] to get in to be seen. No fevers, chills, nausea, or vomiting noted at this time. He has not been using his lymphedema pumps due to the fact that they won't fit on his leg at this point likewise is also not been using his compression for essentially the same reason. 04/16/18 upon evaluation today patient actually appears to be doing a little better in regard to the fluid in his bilateral lower extremities. With that being said he's had three falls since I saw him last week. He also states that he's been feeling very poorly. I was concerned last week and feel like that the concern is still there as far as the congestion in his chest is concerned he seems to be breathing about the same as last week but again he states he's very weak he's not even able to walk further than from the chair to the door. His wife had to buy a wheelchair just to be able to get them out of the house to get to the appointment today. This has me very concerned. 04/23/18 on evaluation today patient actually appears to be doing much better than last week's evaluation. At that  time actually had to transport him to the ER via EMS and he subsequently was admitted for acute pulmonary edema, acute renal failure, and acute congestive heart failure. Fortunately he is doing much better. Apparently they did dialyze him and were able to take off roughly 35 pounds of fluid. Nonetheless he is feeling much better both in regard to his breathing and he's able to get around much better at this time compared to previous. Overall I'm very happy with how things are at this time. There does not appear to be any evidence of infection currently. No fevers, chills, nausea, or vomiting noted at this time. 04/30/2018 patient seen today for follow-up and management of bilateral lower extremity lymphedema. He did express being more sad today than usual due to the recent loss of his dog. He states that he has been compliant with using the lymphedema pumps. However he does admit a minute over the last 2-3 days he has not been using the pumps due to the recent loss of his dog. At this time there is no drainage or open wounds to his lower extremities. The left leg edema is measuring smaller today. Still has a significant amount of edema on bilateral lower extremities With dry flaky skin. He will be referred to the lymphedema clinic for further management. Will continue 3 layer compression wraps and follow-up in 1-2 weeks.Denies any pain, fever, chairs recently. No recent falls or injuries reported during this visit. 05/07/18 on evaluation today patient actually appears to be doing very well in regard to his lower extremities in general all things considered. With that being said he  is having some pain in the legs just due to the amount of swelling. He does have an area where he had a blister on the left lateral lower extremity this is open at this point other than that there's nothing else weeping at this time. 05/14/18 on evaluation today patient actually appears to be doing excellent all things  considered in regard to his lower extremities. He still has a couple areas of weeping on each leg which has continued to be the issue for him. He does have an appointment with the lymphedema clinic although this isn't until February 2020. That was the earliest they had. In the meantime he has continued to tolerate the compression wraps without complication. 05/28/18 on evaluation today patient actually appears to be doing more poorly in regard to his left lower extremity where he has a wound open at this point. He also had a fall where he subsequently injured his right great toe which has led to an open wound at the site unfortunately. He has been tolerating the dressing changes without complication in general as far as the wraps are concerned that he has not been putting any dressing on the left 1st toe ulcer site. 06/11/18 on evaluation today patient appears to be doing much worse in regard to his bilateral lower extremity ulcers. He has been tolerating the dressing changes without complication although his legs have not been wrapped more recently. Overall I am not very pleased with the way his legs appear. I do believe he needs to be back in compression wraps he still has not received his compression wraps from the [facility] hospital as of yet. 06/18/18 on evaluation today patient actually appears to be doing significantly better than last time I saw him. He has been tolerating the compression wraps without complication in the circumferential ulcers especially appear to be doing much better. His toe ulcer on the right in regard to the great toe is better although not as good as the legs in my pinion. No fevers chills noted [place], [place] ([id number]) 07/02/18 on evaluation today patient appears to be doing much better in regard to his lower extremity ulcers. Unfortunately since I last saw him he's had the distal portion of his right great toe if he dated it sounds as if this actually went downhill  very quickly. I had only seen him a few days prior and the toe did not appear to be infected at that point subsequently became infected very rapidly and it was decided by the surgeon that the distal portion of the toe needed to be removed. The patient seems to be doing well in this regard he tells me. With that being said his lower extremities are doing better from the standpoint of the wounds although he is significantly swollen at this point. 07/09/18 on evaluation today patient appears to be doing better in regard to the wounds on his lower extremities. In fact everything is almost completely healed he is just a small area on the left posterior lower extremity that is open at this point. 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 With that being said he needs some kind of compression it does sound like he may not be wearing his compression, that is the wraps, during the entire time between when he's here visit to visit. Apparently his wife  took the current one off because it began to "fall apart". 07/16/18 on evaluation today patient appears to be extremely swollen especially in regard to his left lower extremity unfortunately. He also has a new skin tear over the left lower extremity and there's a smaller area on the right lower extremity as well. Unfortunately this seems to be due in part to blistering and fluid buildup in his leg. He did get the reduction wraps that were ordered by the [facility] hospital for him to go to lymphedema clinic. With that being said his wounds on the legs have not healed to the point to where they would likely accept them as a patient lymphedema clinic currently. We need to try to get this to heal. With that being said he's been taking his wraps off which is not doing him any favors at this  point. In fact this is probably quite counterproductive compared to what needs to occur. We will likely need to increase to a four layer compression wrap and continue to also utilize elevation and he has to keep the wraps on not take them off as he's been doing currently hasn't had a wrap on since Saturday. 07/23/18 on evaluation today patient appears to be doing much better in regard to his bilateral lower extremities. In fact his left lower extremity which was the largest is actually 15 cm smaller today compared to what it was last time he was here in our clinic. This is obviously good news after just one week. Nonetheless the differences he actually kept the wraps on during the entire week this time. That's not typical for him. I do believe he understands a little bit better now the severity of the situation and why it's important for him to keep these wraps on. 07/30/18 on evaluation today patient actually appears to be doing rather well in regard to his lower extremities. His legs are much smaller than they have been in the past and he actually has only one very small rudder superficial region remaining that is not closed on the left lateral/posterior lower extremity even this is almost completely close. I do believe likely next week he will be healed without any complications. I do think we need to continue the wraps however this seems to be beneficial for him. I also think it may be a good time for [place] to go ahead and see about getting the appointment with the lymphedema clinic which is supposed to be made for him in order to keep this moving along and hopefully get them into compression wraps that will in the end help him to remain healed. 08/06/18 on evaluation today patient actually appears to be doing very well in regard as bilateral lower extremities. In fact his wounds appear to be completely healed at this time. He does have bilateral lymphedema which has been extremely well controlled  with the compression wraps. He is in the process of getting appointment with the lymphedema clinic we have made this referral were just waiting to hear back on the schedule time. We need to follow up on that today as well. 08/13/18 on evaluation today patient actually appears to be doing very well in regard to his bilateral lower extremities there are no open wounds at this point. We are gonna go ahead and see about ordering the Velcro compression wraps for him had a discussion with them about [place] doing it versus the [place] they feel like they can definitely afford going ahead and get the wraps themselves  and they would prefer to try to avoid having to go to the lymphedema clinic if it all possible which I completely understand. As long as he has good compression I'm okay either way. 3/17//20 on evaluation today patient actually appears to be doing well in regard to his bilateral lower extremity ulcers. He has been tolerating the dressing changes without complication specifically the compression wraps. Overall is had no issues my fingers finding that I see at this point is that he is having trouble with constipation. He tells me he has not been able to go to the bathroom for about six days. He's taken to over-the-counter oral laxatives unfortunately this is not helping. He has not contacted his doctor. 08/27/18 on evaluation today patient appears to be doing fairly well in regard to his lower extremities at this point. There does not appear to be any new altars is swelling is very well controlled. We are still waiting for his Velcro compression wraps to arrive that should be sometime in the next week is Artie sent the check for these. 09/03/18 on evaluation today patient appears to be doing excellent in regard to his bilateral lower extremities which he shows MARQUI, FORMBY. ([id number]) no signs of wound openings in regard to this point. He does have his Velcro compression wraps which did arrive in the  mail since I saw him last week. Overall he is doing excellent in my pinion. 09/13/18 on evaluation today patient appears to be doing very well currently in regard to the overall appearance of his bilateral lower extremities although he's a little bit more swollen than last time we saw him. At that point he been discharged without any open wounds. Nonetheless he has a small open wound on the posterior left lower extremity with some evidence of cellulitis noted as well. Fortunately I feel like he has made good progress overall with regard to his lower extremities from were things used to be. 09/20/18 on evaluation today patient actually appears to be doing much better. The erythematous lower extremity is improving wound itself which is still open appears to be doing much better as far as but appearance as well as pain is concerned overall very pleased in this regard. There's no signs of active infection at this time. 09/27/18 on evaluation today patient's wounds on the lower extremity actually appear to be doing fairly well at this time which is good news. There is no evidence of active infection currently and again is just as left lower extremity were there any wounds at this point anyway. I believe they may be completely healed but again I'm not 100% sure based on evaluation today. I think one more week of observation would likely be a good idea. 10/04/18 on evaluation today patient actually appears to be doing excellent in regard to the left lower extremity which actually appears to be completely healed as of today. Unfortunately he's been having issues with his right lower extremity have a new wound that has opened. Fortunately there's no evidence of active infection at this time which is good news. 10/10/18 upon evaluation today patient actually appears to be doing a little bit worse with the new open area on his right posterior lower extremity. He's been tolerating the dressing changes without  complication. Right now we been using his compression wraps although I think we may need to switch back to actually performing bilateral compression wraps are in the clinic. No fevers, chills, nausea, or vomiting noted at this time. 10/17/18 on  evaluation today patient actually appears to be doing quite well in regard to his bilateral lower extremity ulcers. He has been tolerating the reps without complication although he would prefer not to rewrap his legs as of today. Fortunately there's no signs of active infection which is good news. No fevers, chills, nausea, or vomiting noted at this time. 10/24/18 on evaluation today patient appears to be doing very well in regard to his lower extremities. His right lower extremity is shown signs of healing and his left lower Trinity though not healed appears to be improving which is excellent news. Overall very pleased with how things seem to be progressing at this time. The patient is likewise happy to hear this. His Velcro compression wraps however have not been put on properly will gonna show his wife how to do that properly today. 10/31/18 on evaluation today patient appears to be doing more poorly in regard to his left lower extremity in particular although both lower extremities actually are showing some signs of being worse than my previous evaluation. Unfortunately I'm just not sure that his compression stockings even with the use of the compression/lymphedema pumps seem to be controlling this well. Upon further questioning he tells me that he also is not able to lie flat in the bed due to his congestive heart failure and difficulty breathing. For that reason he sleeps in his recliner. To make matters worse his recliner also cannot even hold his legs up so instead of even being somewhat elevated they pretty much hang down to the floor. This is the way he sleeps each night which is definitely counterproductive to everything else that were attempting to do  from the standpoint of controlling his fluid. Nonetheless I think that he potentially could benefit from a hospital bed although this would be something that his primary care provider would likely have to order since anything that is order on our side has to be directly related to wound care and again the hospital bed is not necessarily a direct relation to although I think it does contribute to his overall wound status on his lower extremities. 11/07/18 on evaluation today patient appears to be doing better in regard to his bilateral lower extremity. He's been tolerating the dressing changes without complication. We did in the interim since I last saw him switch to just using extras orbiting new alginate and that seems to have done much better for him. I'm very pleased with the overall progress that is made. 11/14/18 on evaluation today patient appears to be redoing rather well in regard to his left lower extremity ulcers which are the only ones remaining at this point. Fortunately there's no signs of active infection at this time which is good news. Overall been very pleased with how things seem to be progressing currently. No fevers, chills, nausea, or vomiting noted at this time. 11/20/17 on evaluation today patient actually appears to be doing quite well in regard to his lower extremities on the left on the right he has several blisters that showed up although there's some question about whether or not he has had his broker compression wraps on like he was supposed to or not. Fortunately there's no signs of active infection at this time which is good news. Unfortunately though he is doing well from the standpoint of the left leg the right leg is not doing as well again this SIGMOND, PATALANO. ([id number]) is when we did not wrap last week. 11/28/18 on evaluation today patient appears  to be doing well in regard to his bilateral lower extremities is left appears to be healed is right is not healed but is  very close to being so. Overall very pleased with how things seem to be progressing. Patient is likewise happy that things are doing well. 12/09/18 on evaluation today patient actually appears to be completely healed which is excellent news. He actually seems to be doing well in regard to the swelling of the bilateral lower extremities which is also great news. Overall very pleased with how things seem to be progressing. Readmission: 03/18/2019 upon evaluation today patient presents for reevaluation concerning issues that he is having with his left lower extremity where he does have a wound noted upon inspection today. He also has a wound on the right second toe on the tip where it is apparently been rubbing in his shoe I did look at issues and you can actually see where this has been occurring as well. Fortunately there is no signs of infection with regard to the toe necessarily although it is very swollen compared to normal that does have me somewhat concerned about the possibility of further evaluation for infection/osteomyelitis. I would recommend an x-ray to start with. Otherwise he states that it is been 1-2 weeks that he has had the draining in regard to left lower extremity he does not know how this happened he has been wearing his compression appropriately and I do see that as well today but nonetheless I do think that he needs to continue to be very cautious with regard to elevation as well. 03/25/2019 on evaluation today patient appears to be doing much better in regard to his lower extremity at this time. That is on the left. He did culture positive for Pseudomonas but the good news is he seems to be doing much better the gentamicin we applied topically seems to have done a great job for him. There is no signs of active infection at this time systemically and locally he is doing much better. No fevers, chills, nausea, vomiting, or diarrhea. In regard to his right toe this seems to  be doing much better there is very little pressure noted at this point I did clean up some of the callus today around the edges of the wound as well as the surface of the wound but to be honest he is progressing quite nicely. 10/30; the area on the left anterior lower tibia area is healed over. His edema control is adequate even though his use of the compression pumps seems very intermittent. He has a juxta lite stocking on the right leg and a think there is 1 for the left leg at home. His wife is putting these on. He has an area on the plantar right second toe which is a hammertoe they are trying to offload this 04/08/2019 on evaluation today patient appears to be doing well in regard to his lower extremities which is good news. We are seeing him today for his toe ulcer which is giving him a little bit more trouble but again still seems to be doing better at this point which is good news. He is going require some sharp debridement in regard to the toe today however. 04/15/2019 on evaluation today patient actually appears to be doing quite well with regard to his toe ulcer. I am very pleased with how things are doing in that regard. With regard to his lower extremity edema in general he tells me he is not been using the  lymphedema pumps regularly although he does not use them. I think that he needs to use them more regularly he does have a small blister on the left lower extremity this is not open at this point but obviously it something that can get worse if he does not keep the compression going and the pumps going as well. 04/22/2019 on evaluation today patient appears to be doing well with regard to his toe ulcer. He has been tolerating the dressing changes without complication. This is measuring slightly smaller this week as compared to last week. Fortunately there is no signs of active infection at this time. 05/06/2019 on evaluation today patient actually appears to be doing excellent. In fact  his toe appears to be completely healed which is great news. There is no signs of active infection at this time. Readmission: Patient presents today for follow-up after having been discharged at the beginning of December 2020. He states that in recent weeks things have reopened and has been having more trouble at this point. With that being said fortunately there is no signs of active infection at this time. No fever chills noted. Nonetheless he also does have an issue with one of his toes as well that seems to be somewhat this is on the right foot second toe. Electronic Signature(s) Signed: 07/17/2019 5:33:10 PM By: Melynda Ripple ([id number]) Entered By: Worthy Keeler on 07/17/2019 17:33:10 [name] ([id number]) -------------------------------------------------------------------------------- Physical Exam Details Patient Name: [name] Date of Service: 07/17/2019 1:15 PM Medical Record Number: [id number] Patient Account Number: [id number] Date of Birth/Sex: 02-12-1946 (73 y.o. M) Treating RN: Army Melia Primary Care Provider: [name] Other Clinician: Referring Provider: Referral, Self Treating Provider/Extender: STONE III, Sian Joles [name]: 0 Constitutional sitting or standing blood pressure is within target range for patient.Marland Kitchen respirations regular, non-labored and within target range for patient.Marland Kitchen temperature within target range for patient.. Obese and well-hydrated in no acute distress. Eyes conjunctiva clear no eyelid edema noted. pupils equal round and reactive to light and accommodation. Ears, Nose, Mouth, and Throat no gross abnormality of ear auricles or external auditory canals. normal hearing noted during conversation. mucus membranes moist. Respiratory normal breathing without difficulty. Cardiovascular 1+ pitting edema of the bilateral lower extremities. Gastrointestinal (GI) soft, non-tender, non-distended, +BS. no ventral  hernia noted. Musculoskeletal Patient unable to walk without assistance. Psychiatric this patient is able to make decisions and demonstrates good insight into disease process. Alert and Oriented x 3. pleasant and cooperative. Notes Upon inspection patient had multiple wounds over the lower extremities at this point bilaterally. He also had a callus over the right second toe I did have to debride some of the necrotic tissue away from this. Once debrided it did appear that he had a small wound opening at this location fortunately nothing too significant which was great news overall I feel like he is doing quite well. He appeared to be very lively and energetic today. Electronic Signature(s) Signed: 07/17/2019 5:35:34 PM By: Worthy Keeler PA-C Entered By: Worthy Keeler on 07/17/2019 17:35:34 [name] ([id number]) -------------------------------------------------------------------------------- Physician Orders Details Patient Name: [name] Date of Service: 07/17/2019 1:15 PM Medical Record Number: [id number] Patient Account Number: [id number] Date of Birth/Sex: 1946/01/01 (73 y.o. 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 E66.09 Other obesity due to excess calories Wound Cleansing Wound #26 Right,Lateral Lower Leg o Cleanse wound with mild soap and water - in office Wound #27 Left,Lateral Lower Leg o Cleanse wound with mild soap and water - in office Wound #28 Left,Anterior Lower Leg o Cleanse wound with mild soap and water - in  office Wound #29 Left,Distal,Anterior Lower Leg o Cleanse wound with mild soap and water - in office Wound #30 Right Toe Second o Cleanse wound with mild soap and water - in office Primary Wound Dressing Wound #26 Right,Lateral Lower Leg o Silver Alginate Wound #27 Left,Lateral Lower Leg o Silver Alginate Wound #28 Left,Anterior Lower Leg o Silver Alginate Wound #29 Left,Distal,Anterior Lower Leg o Silver Alginate Wound #30 Right Toe Second o Silver Alginate Secondary Dressing Wound #26 Right,Lateral Lower Leg o XtraSorb Wound #27 Left,Lateral Lower Leg ILIA, ENGELBERT ([id number]) o XtraSorb Wound #28 Left,Anterior Lower Leg o XtraSorb Wound #29 Left,Distal,Anterior Lower Leg o XtraSorb Wound #30 Right Toe Second o Other - coverlet Dressing Change Frequency Wound #26 Right,Lateral Lower Leg o Change dressing every week Wound #27 Left,Lateral Lower Leg o Change dressing every week Wound #28 Left,Anterior Lower Leg o Change dressing every week Wound #29 Left,Distal,Anterior Lower Leg o Change dressing every week Wound #30 Right Toe Second o Change dressing every day. Follow-up Appointments Wound #26 Right,Lateral Lower Leg o Return Appointment in 1 week. Wound #27 Left,Lateral Lower Leg o Return Appointment in 1 week. Wound #28 Left,Anterior Lower Leg o Return Appointment in 1 week. Wound #29 Left,Distal,Anterior Lower Leg o Return Appointment in 1 week. Wound #30 Right Toe Second o Return Appointment in 1 week. Edema Control Wound #26 Right,Lateral Lower Leg o 4 Layer Compression System - Bilateral Wound #27 Left,Lateral Lower Leg o 4 Layer Compression System - Bilateral Wound #28 Left,Anterior Lower Leg o 4 Layer Compression System - Bilateral Wound #29 Left,Distal,Anterior Lower Leg o 4 Layer Compression System - Bilateral TRINITY, HYLAND ([id number]) Wound #30 Right Toe Second o 4 Layer Compression  System - Bilateral Electronic Signature(s) Signed: 07/17/2019 4:09:28 PM By: Army Melia Signed: 07/17/2019 6:17:56 PM By: Worthy Keeler PA-C Entered By: Army Melia on 07/17/2019 14:17:33 [name] ([id number]) -------------------------------------------------------------------------------- Problem List Details Patient Name: [name] Date of Service: 07/17/2019 1:15 PM Medical Record Number: [id number] Patient Account Number: [id number] Date of Birth/Sex: 04-23-1946 (73 y.o. M) Treating RN: Army Melia Primary Care Provider: [name] Other Clinician: Referring Provider: Referral, Self Treating Provider/Extender: Melburn Hake, Jaycey Gens [name]: 0 Active Problems ICD-10 Evaluated Encounter Code Description Active Date Today Diagnosis I89.0 Lymphedema, not elsewhere classified 07/17/2019 No Yes L97.512 Non-pressure chronic ulcer of other part of right foot with fat 07/17/2019 No Yes layer exposed L97.822 Non-pressure chronic ulcer of other part of left lower leg with 07/17/2019 No Yes fat layer exposed E11.621 Type 2 diabetes mellitus with foot ulcer 07/17/2019 No Yes L97.511 Non-pressure chronic ulcer of other part of right foot limited to 07/17/2019 No Yes breakdown of skin E66.09 Other obesity due to excess calories 07/17/2019 No Yes Inactive Problems Resolved Problems Electronic Signature(s) Signed: 07/17/2019 2:24:18 PM By: Worthy Keeler PA-C Previous Signature: 07/17/2019 1:50:04 PM Version By: Worthy Keeler PA-C Previous Signature: 07/17/2019 1:14:20 PM Version By: Worthy Keeler PA-C Entered By:  Worthy Keeler on 07/17/2019 14:24:18 TION, TSE ([id number]) -------------------------------------------------------------------------------- Progress Note Details Patient Name: TRELL, SECRIST Date of Service: 07/17/2019 1:15 PM Medical Record Number: [id number] Patient Account Number: [id number] Date of Birth/Sex: 1945/06/11 (73 y.o. M) Treating RN:  Army Melia Primary Care Provider: [name] Other Clinician: Referring Provider: Referral, Self Treating Provider/Extender: Melburn Hake, Salah Burlison [name]: 0 Subjective Chief Complaint Information obtained from Patient Bilateral LE Ulcers and right second toe ulcer History of Present Illness (HPI) 10/18/17-He is here for initial evaluation of bilateral lower extremity ulcerations in the presence of venous insufficiency and lymphedema. He has been seen by vascular medicine in the past, Dr. Lucky Cowboy, last seen in 2016. He does have a history of abnormal ABIs, which is to be expected given his lymphedema and venous insufficiency. According to Epic, it appears that all attempts for arterial evaluation and/or angiography were not follow through with by patient. He does have a history of being seen in lymphedema clinic in 2018, stopped going approximately 6 months ago stating "it didn't do any good". He does not have lymphedema pumps, he does not have custom fit compression wrap/stockings. He is diabetic and his recent A1c last month was 7.6. He admits to chronic bilateral lower extremity pain, no change in pain since blister and ulceration development. He is currently being treated with Levaquin for bronchitis. He has home health and we will continue. 10/25/17-He is here in follow-up evaluation for bilateral lower extremity ulcerationssubtle he remains on Levaquin for bronchitis. Right lower extremity with no evidence of drainage or ulceration, persistent left lower extremity ulceration. He states that home health has not been out since his appointment. He went to [facility] on Tuesday, studies revealed: RIGHT ABI 0.9, TBI 0.6 LEFT ABI 1.1, TBI 0.6 with triphasic flow bilaterally. We will continue his same treatment plan. He has been educated on compression therapy and need for elevation. He will benefit from lymphedema pumps 11/01/17-He is here in follow-up evaluation for left  lower extremity ulcer. The right lower extremity remains healed. He has home health services, but they have not been out to see the patient for 2-3 weeks. He states it home health physical therapy changed his dressing yesterday after therapy; he placed Ace wrap compression. We are still waiting for lymphedema pumps, reordered d/t need for company change. 11/08/17-He is here in follow-up evaluation for left lower extremity ulcer. It is improved. Edema is significantly improved with compression therapy. We will continue with same treatment plan and he will follow-up next week. No word regarding lymphedema pumps 11/15/17-He is here in follow-up evaluation for left lower extremity ulcer. He is healed and will be discharged from wound care services. I have reached out to [facility] regarding his lymphedema pumps. They have been unable to reach the patient; the contact number they had with the patient's wife's cell phone and she has not answered any unrecognized calls. Contact should be made today, trial planned for next week; [facility] will continue to follow 11/27/17 on evaluation today patient has multiple blistered areas over the right lower extremity his left lower extremity appears to be doing okay. These blistered areas show signs of no infection which is great news. With that being said he did have some necrotic skin overlying which was mechanically debrided away with saline and gauze today without complication. Overall post debridement the wounds appear to be doing better but in general his swelling seems to be increased. This is obviously not good  news. I think this is what has given rise to the blisters. 12/04/17 on evaluation today patient presents for follow-up concerning his bilateral lower extremity edema in the right lower extremity ulcers. He has been tolerating the dressing changes without complication. With that being said he has had no real issues with the wraps which is also  good news. Overall I'm pleased with the progress he's been making. 12/11/17 on evaluation today patient appears to be doing rather well in regard to his right lateral lower extremity ulcer. He's been tolerating the dressing changes without complication. Fortunately there does not appear to be any evidence of infection at this time. Overall I'm pleased with the progress that is being made. Unfortunately he has been in the hospital due to having what sounds to be a stomach virus/flu fortunately that is starting to get better. 12/18/17 on evaluation today patient actually appears to be doing very well in regard to his bilateral lower extremities the KEKOA, FYOCK. ([id number]) swelling is under fairly good control his lymphedema pumps are still not up and running quite yet. With that being said he does have several areas of opening noted as far as wounds are concerned mainly over the left lower extremity. With that being said I do believe once he gets lymphedema pumps this would least help you mention some the fluid and preventing this from occurring. Hopefully that will be set up soon sleeves are Artie in place at his home he just waiting for the machine. 12/25/17 on evaluation today patient actually appears to be doing excellent in fact all of his ulcers appear to have resolved his legs appear very well. I do think he needs compression stockings we have discussed this and they are actually going to go to [facility] today to elastic therapy to get this fitted for him. I think that is definitely a good thing to do. Readmission: 04/09/18 upon evaluation today this patient is seen for readmission due to bilateral lower extremity lymphedema. He has significant swelling of his extremities especially on the left although the right is also swollen he has weeping from both sides. There are no obvious open wounds at this point. Fortunately he has been doing fairly well for quite a bit of time since I last saw him.  Nonetheless unfortunately this seems to have reopened and is giving quite a bit of trouble. He states this began about a week ago when he first called [place] to get in to be seen. No fevers, chills, nausea, or vomiting noted at this time. He has not been using his lymphedema pumps due to the fact that they won't fit on his leg at this point likewise is also not been using his compression for essentially the same reason. 04/16/18 upon evaluation today patient actually appears to be doing a little better in regard to the fluid in his bilateral lower extremities. With that being said he's had three falls since I saw him last week. He also states that he's been feeling very poorly. I was concerned last week and feel like that the concern is still there as far as the congestion in his chest is concerned he seems to be breathing about the same as last week but again he states he's very weak he's not even able to walk further than from the chair to the door. His wife had to buy a wheelchair just to be able to get them out of the house to get to the appointment today. This has me very  concerned. 04/23/18 on evaluation today patient actually appears to be doing much better than last week's evaluation. At that time actually had to transport him to the ER via EMS and he subsequently was admitted for acute pulmonary edema, acute renal failure, and acute congestive heart failure. Fortunately he is doing much better. Apparently they did dialyze him and were able to take off roughly 35 pounds of fluid. Nonetheless he is feeling much better both in regard to his breathing and he's able to get around much better at this time compared to previous. Overall I'm very happy with how things are at this time. There does not appear to be any evidence of infection currently. No fevers, chills, nausea, or vomiting noted at this time. 04/30/2018 patient seen today for follow-up and management of bilateral lower extremity  lymphedema. He did express being more sad today than usual due to the recent loss of his dog. He states that he has been compliant with using the lymphedema pumps. However he does admit a minute over the last 2-3 days he has not been using the pumps due to the recent loss of his dog. At this time there is no drainage or open wounds to his lower extremities. The left leg edema is measuring smaller today. Still has a significant amount of edema on bilateral lower extremities With dry flaky skin. He will be referred to the lymphedema clinic for further management. Will continue 3 layer compression wraps and follow-up in 1-2 weeks.Denies any pain, fever, chairs recently. No recent falls or injuries reported during this visit. 05/07/18 on evaluation today patient actually appears to be doing very well in regard to his lower extremities in general all things considered. With that being said he is having some pain in the legs just due to the amount of swelling. He does have an area where he had a blister on the left lateral lower extremity this is open at this point other than that there's nothing else weeping at this time. 05/14/18 on evaluation today patient actually appears to be doing excellent all things considered in regard to his lower extremities. He still has a couple areas of weeping on each leg which has continued to be the issue for him. He does have an appointment with the lymphedema clinic although this isn't until February 2020. That was the earliest they had. In the meantime he has continued to tolerate the compression wraps without complication. 05/28/18 on evaluation today patient actually appears to be doing more poorly in regard to his left lower extremity where he has a wound open at this point. He also had a fall where he subsequently injured his right great toe which has led to an open wound at the site unfortunately. He has been tolerating the dressing changes without complication in  general as far as the wraps are concerned that he has not been putting any dressing on the left 1st toe ulcer site. 06/11/18 on evaluation today patient appears to be doing much worse in regard to his bilateral lower extremity ulcers. He has been tolerating the dressing changes without complication although his legs have not been wrapped more recently. Overall I am not very pleased with the way his legs appear. I do believe he needs to be back in compression wraps he still has not received his compression wraps from the [facility] hospital as of yet. LELDON, STEEGE ([id number]) 06/18/18 on evaluation today patient actually appears to be doing significantly better than last time I saw him.  He has been tolerating the compression wraps without complication in the circumferential ulcers especially appear to be doing much better. His toe ulcer on the right in regard to the great toe is better although not as good as the legs in my pinion. No fevers chills noted 07/02/18 on evaluation today patient appears to be doing much better in regard to his lower extremity ulcers. Unfortunately since I last saw him he's had the distal portion of his right great toe if he dated it sounds as if this actually went downhill very quickly. I had only seen him a few days prior and the toe did not appear to be infected at that point subsequently became infected very rapidly and it was decided by the surgeon that the distal portion of the toe needed to be removed. The patient seems to be doing well in this regard he tells me. With that being said his lower extremities are doing better from the standpoint of the wounds although he is significantly swollen at this point. 07/09/18 on evaluation today patient appears to be doing better in regard to the wounds on his lower extremities. In fact everything is almost completely healed he is just a small area on the left posterior lower extremity that is open at this point. 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 With that being said he needs some kind of compression it does sound like he may not be wearing his compression, that is the wraps, during the entire time between when he's here visit to visit. Apparently his wife took the current one off because it began to "fall apart". 07/16/18 on evaluation today patient appears to be extremely swollen especially in regard to his left lower extremity unfortunately. He also has a new skin tear over the left lower extremity and there's a smaller area on the right lower extremity as well. Unfortunately this seems to be due in part to blistering and fluid buildup in his leg. He did get the reduction wraps that were ordered by the [facility] for him to go to lymphedema clinic. With that being said his wounds on the legs have not healed to the point to where they would likely accept them as a patient lymphedema clinic currently. We need to try to get this to heal. With that being said he's been taking his wraps off which is not doing him any favors at this point. In fact this is probably quite counterproductive compared to what needs to occur. We will likely need to increase to a four layer compression wrap and continue to also utilize elevation and he has to keep the wraps on not take them off as he's been doing currently hasn't had a wrap on since Saturday. 07/23/18 on evaluation today patient appears to be doing much better in regard to his bilateral lower extremities. In fact his left lower extremity which was the largest is actually 15 cm smaller today compared to what it was last time he was here in our clinic. This is obviously good news after just one week. Nonetheless the differences he actually kept the wraps on during the entire week this time. That's not  typical for him. I do believe he understands a little bit better now the severity of the situation and why it's important for him to keep these  wraps on. 07/30/18 on evaluation today patient actually appears to be doing rather well in regard to his lower extremities. His legs are much smaller than they have been in the past and he actually has only one very small rudder superficial region remaining that is not closed on the left lateral/posterior lower extremity even this is almost completely close. I do believe likely next week he will be healed without any complications. I do think we need to continue the wraps however this seems to be beneficial for him. I also think it may be a good time for [place] to go ahead and see about getting the appointment with the lymphedema clinic which is supposed to be made for him in order to keep this moving along and hopefully get them into compression wraps that will in the end help him to remain healed. 08/06/18 on evaluation today patient actually appears to be doing very well in regard as bilateral lower extremities. In fact his wounds appear to be completely healed at this time. 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 He is in the process of getting appointment with the lymphedema clinic we have made this referral were just waiting to hear back on the schedule time. We need to follow up on that today as well. 08/13/18 on evaluation today patient actually appears to be doing very well in regard to his bilateral lower extremities there are no open wounds at this point. We are gonna go ahead and see about ordering the Velcro compression wraps for him had a discussion with them about [place] doing it versus the [place] they feel like they can definitely afford going ahead and get the wraps themselves and they would prefer to try to avoid having to go to the lymphedema clinic if it all possible which I  completely understand. As long as he has good compression I'm okay either way. 3/17//20 on evaluation today patient actually appears to be doing well in regard to his bilateral lower extremity ulcers. He has been tolerating the dressing changes without complication specifically the compression wraps. Overall is had no issues my fingers finding that I see at this point is that he is having trouble with constipation. He tells me he has not been able to go to the bathroom for about six days. He's taken to over-the-counter oral laxatives unfortunately this is not helping. He has not contacted his doctor. SHELIA, MAGALLON ([id number]) 08/27/18 on evaluation today patient appears to be doing fairly well in regard to his lower extremities at this point. There does not appear to be any new altars is swelling is very well controlled. We are still waiting for his Velcro compression wraps to arrive that should be sometime in the next week is Artie sent the check for these. 09/03/18 on evaluation today patient appears to be doing excellent in regard to his bilateral lower extremities which he shows no signs of wound openings in regard to this point. He does have his Velcro compression wraps which did arrive in the mail since I saw him last week. Overall he is doing excellent in my pinion. 09/13/18 on evaluation today patient appears to be doing very well currently in regard to the overall appearance of his bilateral lower extremities although he's a little bit more swollen than last time we saw him. At that point he been discharged without any open wounds. Nonetheless he has a small open wound on the posterior left lower extremity  with some evidence of cellulitis noted as well. Fortunately I feel like he has made good progress overall with regard to his lower extremities from were things used to be. 09/20/18 on evaluation today patient actually appears to be doing much better. The erythematous lower extremity is  improving wound itself which is still open appears to be doing much better as far as but appearance as well as pain is concerned overall very pleased in this regard. There's no signs of active infection at this time. 09/27/18 on evaluation today patient's wounds on the lower extremity actually appear to be doing fairly well at this time which is good news. There is no evidence of active infection currently and again is just as left lower extremity were there any wounds at this point anyway. I believe they may be completely healed but again I'm not 100% sure based on evaluation today. I think one more week of observation would likely be a good idea. 10/04/18 on evaluation today patient actually appears to be doing excellent in regard to the left lower extremity which actually appears to be completely healed as of today. Unfortunately he's been having issues with his right lower extremity have a new wound that has opened. Fortunately there's no evidence of active infection at this time which is good news. 10/10/18 upon evaluation today patient actually appears to be doing a little bit worse with the new open area on his right posterior lower extremity. He's been tolerating the dressing changes without complication. Right now we been using his compression wraps although I think we may need to switch back to actually performing bilateral compression wraps are in the clinic. No fevers, chills, nausea, or vomiting noted at this time. 10/17/18 on evaluation today patient actually appears to be doing quite well in regard to his bilateral lower extremity ulcers. He has been tolerating the reps without complication although he would prefer not to rewrap his legs as of today. Fortunately there's no signs of active infection which is good news. No fevers, chills, nausea, or vomiting noted at this time. 10/24/18 on evaluation today patient appears to be doing very well in regard to his lower extremities. His right  lower extremity is shown signs of healing and his left lower Trinity though not healed appears to be improving which is excellent news. Overall very pleased with how things seem to be progressing at this time. The patient is likewise happy to hear this. His Velcro compression wraps however have not been put on properly will gonna show his wife how to do that properly today. 10/31/18 on evaluation today patient appears to be doing more poorly in regard to his left lower extremity in particular although both lower extremities actually are showing some signs of being worse than my previous evaluation. Unfortunately I'm just not sure that his compression stockings even with the use of the compression/lymphedema pumps seem to be controlling this well. Upon further questioning he tells me that he also is not able to lie flat in the bed due to his congestive heart failure and difficulty breathing. For that reason he sleeps in his recliner. To make matters worse his recliner also cannot even hold his legs up so instead of even being somewhat elevated they pretty much hang down to the floor. This is the way he sleeps each night which is definitely counterproductive to everything else that were attempting to do from the standpoint of controlling his fluid. Nonetheless I think that he potentially could benefit from a  hospital bed although this would be something that his primary care provider would likely have to order since anything that is order on our side has to be directly related to wound care and again the hospital bed is not necessarily a direct relation to although I think it does contribute to his overall wound status on his lower extremities. 11/07/18 on evaluation today patient appears to be doing better in regard to his bilateral lower extremity. He's been tolerating the dressing changes without complication. We did in the interim since I last saw him switch to just using extras orbiting new alginate  and that seems to have done much better for him. I'm very pleased with the overall progress that is made. 11/14/18 on evaluation today patient appears to be redoing rather well in regard to his left lower extremity ulcers which are the only ones remaining at this point. Fortunately there's no signs of active infection at this time which is good news. Overall been KOLLEN, ARMENTI ([id number]) very pleased with how things seem to be progressing currently. No fevers, chills, nausea, or vomiting noted at this time. 11/20/17 on evaluation today patient actually appears to be doing quite well in regard to his lower extremities on the left on the right he has several blisters that showed up although there's some question about whether or not he has had his broker compression wraps on like he was supposed to or not. Fortunately there's no signs of active infection at this time which is good news. Unfortunately though he is doing well from the standpoint of the left leg the right leg is not doing as well again this is when we did not wrap last week. 11/28/18 on evaluation today patient appears to be doing well in regard to his bilateral lower extremities is left appears to be healed is right is not healed but is very close to being so. Overall very pleased with how things seem to be progressing. Patient is likewise happy that things are doing well. 12/09/18 on evaluation today patient actually appears to be completely healed which is excellent news. He actually seems to be doing well in regard to the swelling of the bilateral lower extremities which is also great news. Overall very pleased with how things seem to be progressing. Readmission: 03/18/2019 upon evaluation today patient presents for reevaluation concerning issues that he is having with his left lower extremity where he does have a wound noted upon inspection today. He also has a wound on the right second toe on the tip where it is apparently been  rubbing in his shoe I did look at issues and you can actually see where this has been occurring as well. Fortunately there is no signs of infection with regard to the toe necessarily although it is very swollen compared to normal that does have me somewhat concerned about the possibility of further evaluation for infection/osteomyelitis. I would recommend an x-ray to start with. Otherwise he states that it is been 1-2 weeks that he has had the draining in regard to left lower extremity he does not know how this happened he has been wearing his compression appropriately and I do see that as well today but nonetheless I do think that he needs to continue to be very cautious with regard to elevation as well. 03/25/2019 on evaluation today patient appears to be doing much better in regard to his lower extremity at this time. That is on the left. He did culture positive for Pseudomonas but  the good news is he seems to be doing much better the gentamicin we applied topically seems to have done a great job for him. There is no signs of active infection at this time systemically and locally he is doing much better. No fevers, chills, nausea, vomiting, or diarrhea. In regard to his right toe this seems to be doing much better there is very little pressure noted at this point I did clean up some of the callus today around the edges of the wound as well as the surface of the wound but to be honest he is progressing quite nicely. 10/30; the area on the left anterior lower tibia area is healed over. His edema control is adequate even though his use of the compression pumps seems very intermittent. He has a juxta lite stocking on the right leg and a think there is 1 for the left leg at home. His wife is putting these on. He has an area on the plantar right second toe which is a hammertoe they are trying to offload this 04/08/2019 on evaluation today patient appears to be doing well in regard to his lower  extremities which is good news. We are seeing him today for his toe ulcer which is giving him a little bit more trouble but again still seems to be doing better at this point which is good news. He is going require some sharp debridement in regard to the toe today however. 04/15/2019 on evaluation today patient actually appears to be doing quite well with regard to his toe ulcer. I am very pleased with how things are doing in that regard. With regard to his lower extremity edema in general he tells me he is not been using the lymphedema pumps regularly although he does not use them. I think that he needs to use them more regularly he does have a small blister on the left lower extremity this is not open at this point but obviously it something that can get worse if he does not keep the compression going and the pumps going as well. 04/22/2019 on evaluation today patient appears to be doing well with regard to his toe ulcer. He has been tolerating the dressing changes without complication. This is measuring slightly smaller this week as compared to last week. Fortunately there is no signs of active infection at this time. 05/06/2019 on evaluation today patient actually appears to be doing excellent. In fact his toe appears to be completely healed which is great news. There is no signs of active infection at this time. Readmission: Patient presents today for follow-up after having been discharged at the beginning of December 2020. He states that in recent weeks things have reopened and has been having more trouble at this point. With that being said fortunately there is no signs of active infection at this time. No fever chills noted. Nonetheless he also does have an issue with one of his toes as well that EVYN, PUTZIER. ([id number]) seems to be somewhat this is on the right foot second toe. Patient History Information obtained from Patient. Allergies Corticosteroids (Glucocorticoids) Family  History Cancer - Paternal Grandparents, Kidney Disease - Siblings, Lung Disease - Mother, No family history of Diabetes, Heart Disease, Hereditary Spherocytosis, Hypertension, Seizures, Stroke, Thyroid Problems, Tuberculosis. Social History Never smoker, Marital Status - Married, Alcohol Use - Never, Drug Use - No History, Caffeine Use - Never. Medical History Eyes Denies history of Cataracts, Glaucoma, Optic Neuritis Ear/Nose/Mouth/Throat Denies history of Chronic sinus problems/congestion, Middle  ear problems Hematologic/Lymphatic Patient has history of Anemia - aplastic anemia, Lymphedema Denies history of Hemophilia, Human Immunodeficiency Virus, Sickle Cell Disease Respiratory Patient has history of Chronic Obstructive Pulmonary Disease (COPD), Sleep Apnea Denies history of Aspiration, Asthma, Pneumothorax, Tuberculosis Cardiovascular Patient has history of Congestive Heart Failure, Coronary Artery Disease, Hypertension, Peripheral Venous Disease Denies history of Angina, Arrhythmia, Deep Vein Thrombosis, Hypotension, Myocardial Infarction, Peripheral Arterial Disease, Phlebitis, Vasculitis Gastrointestinal Denies history of Cirrhosis , Colitis, Crohn s, Hepatitis A, Hepatitis B, Hepatitis C Endocrine Patient has history of Type II Diabetes Denies history of Type I Diabetes Genitourinary Denies history of End Stage Renal Disease Immunological Denies history of Lupus Erythematosus, Raynaud s, Scleroderma Integumentary (Skin) Denies history of History of Burn, History of pressure wounds Musculoskeletal Patient has history of Osteoarthritis Denies history of Gout, Rheumatoid Arthritis, Osteomyelitis Neurologic Patient has history of Neuropathy Denies history of Dementia, Quadriplegia, Paraplegia, Seizure Disorder Oncologic Patient has history of Received Chemotherapy - last dose 2006 Denies history of Received Radiation Psychiatric Denies history of Anorexia/bulimia,  Confinement Anxiety Hospitalization/Surgery History - bronchitis. Medical And Surgical History Notes Respiratory VERNE, LANUZA ([id number]) home O2 at night as needed Cardiovascular varicose veins Neurologic CVA - 1992 Review of Systems (ROS) Eyes Denies complaints or symptoms of Dry Eyes, Vision Changes, Glasses / Contacts. Ear/Nose/Mouth/Throat Denies complaints or symptoms of Difficult clearing ears, Sinusitis. Hematologic/Lymphatic Denies complaints or symptoms of Bleeding / Clotting Disorders, Human Immunodeficiency Virus. Respiratory Denies complaints or symptoms of Chronic or frequent coughs, Shortness of Breath. Cardiovascular Complains or has symptoms of LE edema. Denies complaints or symptoms of Chest pain. Gastrointestinal Denies complaints or symptoms of Frequent diarrhea, Nausea, Vomiting. Endocrine Denies complaints or symptoms of Hepatitis, Thyroid disease, Polydypsia (Excessive Thirst). Genitourinary Denies complaints or symptoms of Kidney failure/ Dialysis, Incontinence/dribbling. Immunological Denies complaints or symptoms of Hives, Itching. Integumentary (Skin) Complains or has symptoms of Wounds, Swelling. Denies complaints or symptoms of Bleeding or bruising tendency, Breakdown. Musculoskeletal Denies complaints or symptoms of Muscle Pain, Muscle Weakness. Neurologic Denies complaints or symptoms of Numbness/parasthesias, Focal/Weakness. Psychiatric Denies complaints or symptoms of Anxiety, Claustrophobia. Objective Constitutional sitting or standing blood pressure is within target range for patient.Marland Kitchen respirations regular, non-labored and within target range for patient.Marland Kitchen temperature within target range for patient.. Obese and well-hydrated in no acute distress. Vitals Time Taken: 1:05 PM, Height: 66 in, Source: Stated, Weight: 292 lbs, Source: Stated, BMI: 47.1, Temperature: 98.5 F, Pulse: 78 bpm, Respiratory Rate: 18 breaths/min, Blood Pressure:  130/66 mmHg. Eyes conjunctiva clear no eyelid edema noted. pupils equal round and reactive to light and accommodation. Ears, Nose, Mouth, and Throat no gross abnormality of ear auricles or external auditory canals. normal hearing noted during conversation. mucus JAREE, TRINKA. ([id number]) membranes moist. Respiratory normal breathing without difficulty. Cardiovascular 1+ pitting edema of the bilateral lower extremities. Gastrointestinal (GI) soft, non-tender, non-distended, +BS. no ventral hernia noted. Musculoskeletal Patient unable to walk without assistance. Psychiatric this patient is able to make decisions and demonstrates good insight into disease process. Alert and Oriented x 3. pleasant and cooperative. General Notes: Upon inspection patient had multiple wounds over the lower extremities at this point bilaterally. He also had a callus over the right second toe I did have to debride some of the necrotic tissue away from this. Once debrided it did appear that he had a small wound opening at this location fortunately nothing too significant which was great news overall I feel like he is doing quite well. He appeared to be  very lively and energetic today. Integumentary (Hair, Skin) Wound #26 status is Open. Original cause of wound was Gradually Appeared. The wound is located on the Right,Lateral Lower Leg. The wound measures 9.5cm length x 7.5cm width x 0.1cm depth; 55.96cm^2 area and 5.596cm^3 volume. There is Fat Layer (Subcutaneous Tissue) Exposed exposed. There is no tunneling or undermining noted. There is a large amount of serous drainage noted. The wound margin is indistinct and nonvisible. There is medium (34-66%) pink granulation within the wound bed. There is a medium (34-66%) amount of necrotic tissue within the wound bed including Adherent Slough. Wound #27 status is Open. Original cause of wound was Gradually Appeared. The wound is located on the Left,Lateral Lower Leg.  The wound measures 1.5cm length x 3.5cm width x 0.1cm depth; 4.123cm^2 area and 0.412cm^3 volume. There is Fat Layer (Subcutaneous Tissue) Exposed exposed. There is no tunneling or undermining noted. There is a large amount of serous drainage noted. The wound margin is flat and intact. There is medium (34-66%) pink granulation within the wound bed. There is a medium (34-66%) amount of necrotic tissue within the wound bed including Adherent Slough. Wound #28 status is Open. Original cause of wound was Gradually Appeared. The wound is located on the Left,Anterior Lower Leg. The wound measures 6cm length x 10cm width x 0.1cm depth; 47.124cm^2 area and 4.712cm^3 volume. There is Fat Layer (Subcutaneous Tissue) Exposed exposed. There is no tunneling or undermining noted. There is a large amount of serous drainage noted. The wound margin is flat and intact. There is medium (34-66%) pink granulation within the wound bed. There is a medium (34-66%) amount of necrotic tissue within the wound bed including Adherent Slough. Wound #29 status is Open. Original cause of wound was Gradually Appeared. The wound is located on the [facility] Lower Leg. The wound measures 0.4cm length x 0.3cm width x 0.6cm depth; 0.094cm^2 area and 0.057cm^3 volume. There is Fat Layer (Subcutaneous Tissue) Exposed exposed. There is no tunneling or undermining noted. There is a medium amount of serous drainage noted. The wound margin is flat and intact. There is small (1-33%) pink granulation within the wound bed. There is a large (67-100%) amount of necrotic tissue within the wound bed including Adherent Slough. Wound #30 status is Open. Original cause of wound was Gradually Appeared. The wound is located on the Right Toe Second. The wound measures 0.5cm length x 0.5cm width x 0.1cm depth; 0.196cm^2 area and 0.02cm^3 volume. There is Fat Layer (Subcutaneous Tissue) Exposed exposed. There is no tunneling or undermining noted.  There is a medium amount of serosanguineous drainage noted. The wound margin is flat and intact. There is large (67-100%) red granulation within the wound bed. There is no necrotic tissue within the wound bed. 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 There was a Excisional Skin/Subcutaneous Tissue Debridement with a total area of 2.25 sq cm performed by STONE III, Farrin Shadle E., PA-C. With the following instrument(s): Curette to remove Viable  and Non-Viable tissue/material. Material removed includes Subcutaneous Tissue after achieving pain control using Lidocaine. A time out was conducted at 14:12, prior to the start of the procedure. There was no bleeding. The procedure was tolerated well. Post Debridement Measurements: 0.5cm length x 0.5cm width x 0.1cm depth; 0.02cm^3 volume. Character of Wound/Ulcer Post Debridement is stable. Severity of Tissue Post Debridement is: Fat layer exposed. Post procedure Diagnosis Wound #30: Same as Pre-Procedure Plan Wound Cleansing: Wound #26 Right,Lateral Lower Leg: Cleanse wound with mild soap and water - in office Wound #27 Left,Lateral Lower Leg: Cleanse wound with mild soap and water - in office Wound #28 Left,Anterior Lower Leg: Cleanse wound with mild soap and water - in office Wound #29 Left,Distal,Anterior Lower Leg: Cleanse wound with mild soap and water - in office Wound #30  Right Toe Second: Cleanse wound with mild soap and water - in office Primary Wound Dressing: Wound #26 Right,Lateral Lower Leg: Silver Alginate Wound #27 Left,Lateral Lower Leg: Silver Alginate Wound #28 Left,Anterior Lower Leg: Silver Alginate Wound #29 Left,Distal,Anterior Lower Leg: Silver Alginate Wound #30 Right Toe SecondWEYLYN, RICCIUTI ([id number]) Silver Alginate Secondary Dressing: Wound #26 Right,Lateral Lower Leg: XtraSorb Wound #27 Left,Lateral Lower Leg: XtraSorb Wound #28 Left,Anterior Lower Leg: XtraSorb Wound #29 Left,Distal,Anterior Lower Leg: XtraSorb Wound #30 Right Toe Second: Other - coverlet Dressing Change Frequency: Wound #26 Right,Lateral Lower Leg: Change dressing every week Wound #27 Left,Lateral Lower Leg: Change dressing every week Wound #28 Left,Anterior Lower Leg: Change dressing every week Wound #29 Left,Distal,Anterior Lower Leg: Change dressing every week Wound #30 Right Toe Second: Change dressing every day. Follow-up Appointments: Wound #26 Right,Lateral Lower Leg: Return Appointment in 1 week. Wound #27 Left,Lateral Lower Leg: Return Appointment in 1 week. Wound #28 Left,Anterior Lower Leg: Return Appointment in 1 week. Wound #29 Left,Distal,Anterior Lower Leg: Return Appointment in 1 week. Wound #30 Right Toe Second: Return Appointment in 1 week. Edema Control: Wound #26 Right,Lateral Lower Leg: 4 Layer Compression System - Bilateral Wound #27 Left,Lateral Lower Leg: 4 Layer Compression System - Bilateral Wound #28 Left,Anterior Lower Leg: 4 Layer Compression System - Bilateral Wound #29 Left,Distal,Anterior Lower Leg: 4 Layer Compression System - Bilateral Wound #30 Right Toe Second: 4 Layer Compression System - Bilateral 1. My suggestion at this time is can be that we go ahead and continue with the previous orders that we utilized which was a silver alginate dressing along with a 4-layer compression wrap. 2. I am  also going to recommend that he use an alginate dressing over the toe with a coverlet as well I think that this will be appropriate at this time. 3. I would suggest for the patient that we also have him continue with elevation is much as he can to try to keep his edema under good control. Obviously the more he can elevate his legs the better. We will see patient back for reevaluation in 1 week here in the clinic. If anything worsens or changes patient will contact our office for additional recommendations. XZAIVER, VAYDA ([id number]) Electronic Signature(s) Signed: 07/17/2019 5:38:34 PM By: Worthy Keeler PA-C Entered By: Worthy Keeler on 07/17/2019 17:38:34 CARVIN, ALMAS ([id number]) -------------------------------------------------------------------------------- ROS/PFSH Details Patient Name: [name] Date of Service: 07/17/2019 1:15 PM Medical Record Number: [id number] Patient Account Number: [id number] Date of Birth/Sex: 01/19/1946 (73 y.o. M) Treating RN: Montey Hora Primary Care Provider: [name] Other Clinician: Referring Provider: Referral, Self Treating Provider/Extender: STONE III, Lindalou Soltis [name]: 0 Information Obtained  From Patient Eyes Complaints and Symptoms: Negative for: Dry Eyes; Vision Changes; Glasses / Contacts Medical History: Negative for: Cataracts; Glaucoma; Optic Neuritis Ear/Nose/Mouth/Throat Complaints and Symptoms: Negative for: Difficult clearing ears; Sinusitis Medical History: Negative for: Chronic sinus problems/congestion; Middle ear problems Hematologic/Lymphatic Complaints and Symptoms: Negative for: Bleeding / Clotting Disorders; Human Immunodeficiency Virus Medical History: Positive for: Anemia - aplastic anemia; Lymphedema Negative for: Hemophilia; Human Immunodeficiency Virus; Sickle Cell Disease Respiratory Complaints and Symptoms: Negative for: Chronic or frequent coughs; Shortness of Breath Medical  History: Positive for: Chronic Obstructive Pulmonary Disease (COPD); Sleep Apnea Negative for: Aspiration; Asthma; Pneumothorax; Tuberculosis Past Medical History Notes: home O2 at night as needed Cardiovascular Complaints and Symptoms: Positive for: LE edema Negative for: Chest pain Medical History: Positive for: Congestive Heart Failure; Coronary Artery Disease; Hypertension; Peripheral Venous Disease Negative for: Angina; Arrhythmia; Deep Vein Thrombosis; Hypotension; Myocardial Infarction; Peripheral Arterial Disease; Phlebitis; Vasculitis Past Medical History Notes: ANTONIS, LOR ([id number]) varicose veins Gastrointestinal Complaints and Symptoms: Negative for: Frequent diarrhea; Nausea; Vomiting Medical History: Negative for: Cirrhosis ; Colitis; Crohnos; Hepatitis A; Hepatitis B; Hepatitis C Endocrine Complaints and Symptoms: Negative for: Hepatitis; Thyroid disease; Polydypsia (Excessive Thirst) Medical History: Positive for: Type II Diabetes Negative for: Type I Diabetes Time with diabetes: since 2006 Treated with: Insulin Blood sugar tested every day: Yes Tested : Blood sugar testing results: Breakfast: 209 Genitourinary Complaints and Symptoms: Negative for: Kidney failure/ Dialysis; Incontinence/dribbling Medical History: Negative for: End Stage Renal Disease Immunological Complaints and Symptoms: Negative for: Hives; Itching Medical History: Negative for: Lupus Erythematosus; Raynaudos; Scleroderma Integumentary (Skin) Complaints and Symptoms: Positive for: Wounds; Swelling Negative for: Bleeding or bruising tendency; Breakdown Medical History: Negative for: History of Burn; History of pressure wounds Musculoskeletal Complaints and Symptoms: Negative for: Muscle Pain; Muscle Weakness Medical History: Positive for: Osteoarthritis Negative for: Gout; Rheumatoid Arthritis; Osteomyelitis WHITFIELD, DULAY. ([id number]) Neurologic Complaints and  Symptoms: Negative for: Numbness/parasthesias; Focal/Weakness Medical History: Positive for: Neuropathy Negative for: Dementia; Quadriplegia; Paraplegia; Seizure Disorder Past Medical History Notes: CVA - 1992 Psychiatric Complaints and Symptoms: Negative for: Anxiety; Claustrophobia Medical History: Negative for: Anorexia/bulimia; Confinement Anxiety Oncologic Medical History: Positive for: Received Chemotherapy - last dose 2006 Negative for: Received Radiation Immunizations Pneumococcal Vaccine: Received Pneumococcal Vaccination: Yes Implantable Devices None Hospitalization / Surgery History Type of Hospitalization/Surgery bronchitis Family and Social History Cancer: Yes - Paternal Grandparents; Diabetes: No; Heart Disease: No; Hereditary Spherocytosis: No; Hypertension: No; Kidney Disease: Yes - Siblings; Lung Disease: Yes - Mother; Seizures: No; Stroke: No; Thyroid Problems: No; Tuberculosis: No; Never smoker; Marital Status - Married; Alcohol Use: Never; Drug Use: No History; Caffeine Use: Never; Financial Concerns: No; Food, Clothing or Shelter Needs: No; Support System Lacking: No; Transportation Concerns: No Electronic Signature(s) Signed: 07/17/2019 4:40:05 PM By: Montey Hora Signed: 07/17/2019 6:17:56 PM By: Worthy Keeler PA-C Entered By: Montey Hora on 07/17/2019 13:20:43 [name] ([id number]) -------------------------------------------------------------------------------- [place] Details Patient Name: [name] Date of Service: 07/17/2019 Medical Record Number: [id number] Patient Account Number: [id number] Date of Birth/Sex: [birth year] (73 y.o. M) Treating RN: Army Melia Primary Care Provider: [name] Other Clinician: Referring Provider: Referral, Self Treating Provider/Extender: Melburn Hake, Chandan Fly [name]: 0 Diagnosis Coding ICD-10 Codes Code Description I89.0 Lymphedema, not elsewhere classified L97.512 Non-pressure chronic  ulcer of other part of right foot with fat layer exposed L97.822 Non-pressure chronic ulcer of other part of left lower leg with fat layer exposed E11.621 Type 2 diabetes mellitus with foot ulcer L97.511 Non-pressure chronic  ulcer of other part of right foot limited to breakdown of skin E66.09 Other obesity due to excess calories Facility Procedures CPT4 Code Description: 73710626 99213 - WOUND CARE VISIT-LEV 3 EST PT Modifier: Quantity: 1 CPT4 Code Description: [id number] 11042 - DEB SUBQ TISSUE 20 SQ CM/< ICD-10 Diagnosis Description L97.511 Non-pressure chronic ulcer of other part of right foot limited t Modifier: o breakdown of Quantity: 1 skin Physician Procedures CPT4 Code Description: 0350093 99213 - WC PHYS LEVEL 3 - EST PT ICD-10 Diagnosis Description I89.0 Lymphedema, not elsewhere classified L97.512 Non-pressure chronic ulcer of other part of right foot with fat E11.621 Type 2 diabetes mellitus with foot  ulcer L97.822 Non-pressure chronic ulcer of other part of left lower leg with Modifier: 25 layer exposed fat layer expose Quantity: 1 d CPT4 Code Description: [id number] 11042 - WC PHYS SUBQ TISS 20 SQ CM ICD-10 Diagnosis Description L97.511 Non-pressure chronic ulcer of other part of right foot limited t Modifier: o breakdown of s Quantity: 1 kin Electronic Signature(s) Signed: 07/17/2019 5:41:57 PM By: Worthy Keeler PA-C Entered By: Worthy Keeler on 07/17/2019 17:41:57

## 2019-07-24 ENCOUNTER — Ambulatory Visit: Payer: Medicare PPO | Admitting: Physician Assistant

## 2019-07-25 ENCOUNTER — Other Ambulatory Visit: Payer: Self-pay

## 2019-07-25 ENCOUNTER — Encounter: Payer: Medicare PPO | Admitting: Physician Assistant

## 2019-07-25 DIAGNOSIS — E11621 Type 2 diabetes mellitus with foot ulcer: Secondary | ICD-10-CM | POA: Diagnosis not present

## 2019-07-25 DIAGNOSIS — I872 Venous insufficiency (chronic) (peripheral): Secondary | ICD-10-CM | POA: Diagnosis not present

## 2019-07-25 DIAGNOSIS — K5900 Constipation, unspecified: Secondary | ICD-10-CM | POA: Diagnosis not present

## 2019-07-25 DIAGNOSIS — E11622 Type 2 diabetes mellitus with other skin ulcer: Secondary | ICD-10-CM | POA: Diagnosis not present

## 2019-07-25 DIAGNOSIS — L97822 Non-pressure chronic ulcer of other part of left lower leg with fat layer exposed: Secondary | ICD-10-CM | POA: Diagnosis not present

## 2019-07-25 DIAGNOSIS — S99921A Unspecified injury of right foot, initial encounter: Secondary | ICD-10-CM | POA: Diagnosis not present

## 2019-07-25 DIAGNOSIS — L97512 Non-pressure chronic ulcer of other part of right foot with fat layer exposed: Secondary | ICD-10-CM | POA: Diagnosis not present

## 2019-07-25 DIAGNOSIS — J449 Chronic obstructive pulmonary disease, unspecified: Secondary | ICD-10-CM | POA: Diagnosis not present

## 2019-07-25 DIAGNOSIS — I890 Lymphedema, not elsewhere classified: Secondary | ICD-10-CM | POA: Diagnosis not present

## 2019-07-25 DIAGNOSIS — L97812 Non-pressure chronic ulcer of other part of right lower leg with fat layer exposed: Secondary | ICD-10-CM | POA: Diagnosis not present

## 2019-07-25 NOTE — Progress Notes (Addendum)
JUSITN, SALSGIVER ([id number]) Visit Report for 07/25/2019 Arrival Information Details Patient Name: Adam Merritt, Adam Merritt Date of Service: 07/25/2019 11:30 AM Medical Record Number: [id number] Patient Account Number: [id number] Date of Birth/Sex: January 03, 1946 (74 y.o. M) Treating RN: Army Melia Primary Care Ruby Dilone: [name] Other Clinician: Referring Tellis Spivak: [name] Treating Rosalva Neary/Extender: Melburn Hake, [name]: 1 Visit Information History Since Last Visit Added or deleted any medications: No Patient Arrived: Walker Any new allergies or adverse reactions: No Arrival Time: 11:22 Had a fall or experienced change in No Accompanied By: wife activities of daily living that may affect Transfer Assistance: None risk of falls: Patient Identification Verified: Yes Signs or symptoms of abuse/neglect since last visito No Hospitalized since last visit: No Has Dressing in Place as Prescribed: Yes Pain Present Now: No Electronic Signature(s) Signed: 07/25/2019 12:53:25 PM By: Army Melia Entered By: Army Melia on 07/25/2019 11:22:32 Adam Merritt ([id number]) -------------------------------------------------------------------------------- Compression Therapy Details Patient Name: Adam Merritt Date of Service: 07/25/2019 11:30 AM Medical Record Number: [id number] Patient Account Number: [id number] Date of Birth/Sex: [birth year] (74 y.o. M) Treating RN: Montey Hora Primary Care Sharece Fleischhacker: [name] Other Clinician: Referring Akyra Bouchie: [name] Treating Jameson Morrow/Extender: Melburn Hake, [name]: 1 Compression Therapy Performed for Wound Assessment: Wound #26 Right,Lateral Lower Leg Performed By: Clinician Montey Hora, RN Compression Type: Four Layer Post Procedure Diagnosis Same as Pre-procedure Electronic Signature(s) Signed: 07/25/2019 12:01:32 PM By: Montey Hora Entered By: Montey Hora on 07/25/2019 11:42:11 Adam Merritt  ([id number]) -------------------------------------------------------------------------------- Compression Therapy Details Patient Name: Adam Merritt Date of Service: 07/25/2019 11:30 AM Medical Record Number: [id number] Patient Account Number: [id number] Date of Birth/Sex: 1945-06-08 (74 y.o. M) Treating RN: Montey Hora Primary Care Kamea Dacosta: [name] Other Clinician: Referring Jullianna Gabor: [name] Treating Carnell Casamento/Extender: Melburn Hake, [name]: 1 Compression Therapy Performed for Wound Assessment: Wound #27 Left,Lateral Lower Leg Performed By: Clinician Montey Hora, RN Compression Type: Four Layer Post Procedure Diagnosis Same as Pre-procedure Electronic Signature(s) Signed: 07/25/2019 12:01:32 PM By: Montey Hora Entered By: Montey Hora on 07/25/2019 11:42:11 Adam Merritt ([id number]) -------------------------------------------------------------------------------- Encounter Discharge Information Details Patient Name: Adam Merritt Date of Service: 07/25/2019 11:30 AM Medical Record Number: [id number] Patient Account Number: [id number] Date of Birth/Sex: 08-29-1945 (74 y.o. M) Treating RN: Montey Hora Primary Care Yannick Steuber: [name] Other Clinician: Referring Anyeli Hockenbury: [name] Treating Waniya Hoglund/Extender: Melburn Hake, [name]: 1 Encounter Discharge Information Items Discharge Condition: Stable Ambulatory Status: Walker Discharge Destination: Home Transportation: Private Auto Accompanied By: wife Schedule Follow-up Appointment: Yes Clinical Summary of Care: Electronic Signature(s) Signed: 07/25/2019 12:01:32 PM By: Montey Hora Entered By: Montey Hora on 07/25/2019 11:44:11 Adam Merritt ([id number]) -------------------------------------------------------------------------------- Lower Extremity Assessment Details Patient Name: Adam Merritt Date of Service: 07/25/2019 11:30 AM Medical Record Number:  [id number] Patient Account Number: [id number] Date of Birth/Sex: [birth year] (74 y.o. M) Treating RN: Army Melia Primary Care Virgene Tirone: [name] Other Clinician: Referring Solyana Nonaka: [name] Treating Sharnika Binney/Extender: Melburn Hake, [name]: 1 Edema Assessment Assessed: [Left: No] [Right: No] Edema: [Left: Yes] [Right: Yes] Calf Left: Right: Point of Measurement: 34 cm From Medial Instep 56 cm 51 cm Ankle Left: Right: Point of Measurement: 12 cm From Medial Instep 39 cm 29 cm Vascular Assessment Pulses: Dorsalis Pedis Palpable: [Left:Yes] [Right:Yes] Electronic Signature(s) Signed: 07/25/2019 12:53:25 PM By: Army Melia Entered By: Army Melia on 07/25/2019 11:36:39 Adam Merritt ([id number]) -------------------------------------------------------------------------------- Multi Wound Chart Details Patient Name: Adam Merritt, [name]  Date of Service: 07/25/2019 11:30 AM Medical Record Number: [id number] Patient Account Number: [id number] Date of Birth/Sex: May 14, 1946 (74 y.o. M) Treating RN: Montey Hora Primary Care Tameia Rafferty: [name] Other Clinician: Referring Jester Klingberg: [name] Treating Kimberlin Scheel/Extender: Melburn Hake, [name]: 1 Vital Signs Height(in): 29 Pulse(bpm): 78 Weight(lbs): 292 Blood Pressure(mmHg): 154/59 Body Mass Index(BMI): 47 Temperature(F): 98.2 Respiratory Rate 16 (breaths/min): Photos: Wound Location: Right, Lateral Lower Leg Left, Lateral Lower Leg Left, Anterior Lower Leg Wounding Event: Gradually Appeared Gradually Appeared Gradually Appeared Primary Etiology: Lymphedema Lymphedema Lymphedema Comorbid History: Anemia, Lymphedema, Anemia, Lymphedema, Anemia, Lymphedema, Chronic Obstructive Chronic Obstructive Chronic Obstructive Pulmonary Disease (COPD), Pulmonary Disease (COPD), Pulmonary Disease (COPD), Sleep Apnea, Congestive Sleep Apnea, Congestive Sleep Apnea, Congestive Heart Failure, Coronary Artery  Heart Failure, Coronary Artery Heart Failure, Coronary Artery Disease, Hypertension, Disease, Hypertension, Disease, Hypertension, Peripheral Venous Disease, Peripheral Venous Disease, Peripheral Venous Disease, Type II Diabetes, Type II Diabetes, Type II Diabetes, Osteoarthritis, Neuropathy, Osteoarthritis, Neuropathy, Osteoarthritis, Neuropathy, Received Chemotherapy Received Chemotherapy Received Chemotherapy Date Acquired: 07/07/2019 07/07/2019 07/07/2019 Weeks of Treatment: 1 1 1  Wound Status: Open Open Healed - Epithelialized Measurements L x W x D 7x4x0.1 4.5x8.5x0.1 0x0x0 (cm) Area (cm) : 21.991 30.041 0 Volume (cm) : 2.199 3.004 0 % Reduction in Area: 60.70% -628.60% 100.00% % Reduction in Volume: 60.70% -629.10% 100.00% Classification: Full Thickness Without Full Thickness Without Full Thickness Without Exposed Support Structures Exposed Support Structures Exposed Support Structures Exudate Amount: Large Large Large Exudate Type: Serous Serous Serous Exudate Color: Geophysical data processor Wound Margin: Indistinct, nonvisible Flat and Intact Flat and Intact Granulation Amount: Medium (34-66%) Medium (34-66%) Medium (34-66%) Adam Merritt, Adam Merritt ([id number]) Granulation Quality: Pink Pink Pink Necrotic Amount: Medium (34-66%) Medium (34-66%) Medium (34-66%) Exposed Structures: Fat Layer (Subcutaneous Fat Layer (Subcutaneous Fat Layer (Subcutaneous Tissue) Exposed: Yes Tissue) Exposed: Yes Tissue) Exposed: Yes Fascia: No Fascia: No Fascia: No Tendon: No Tendon: No Tendon: No Muscle: No Muscle: No Muscle: No Joint: No Joint: No Joint: No Bone: No Bone: No Bone: No Epithelialization: Small (1-33%) Small (1-33%) None Wound Number: 29 30 N/A Photos: N/A Wound Location: Left, Distal, Anterior Lower Leg Right Toe Second N/A Wounding Event: Gradually Appeared Gradually Appeared N/A Primary Etiology: Diabetic Wound/Ulcer of the Diabetic Wound/Ulcer of the N/A Lower Extremity Lower  Extremity Comorbid History: Anemia, Lymphedema, Anemia, Lymphedema, N/A Chronic Obstructive Chronic Obstructive Pulmonary Disease (COPD), Pulmonary Disease (COPD), Sleep Apnea, Congestive Sleep Apnea, Congestive Heart Failure, Coronary Artery Heart Failure, Coronary Artery Disease, Hypertension, Disease, Hypertension, Peripheral Venous Disease, Peripheral Venous Disease, Type II Diabetes, Type II Diabetes, Osteoarthritis, Neuropathy, Osteoarthritis, Neuropathy, Received Chemotherapy Received Chemotherapy Date Acquired: 07/17/2019 06/06/2019 N/A Weeks of Treatment: 1 1 N/A Wound Status: Healed - Epithelialized Healed - Epithelialized N/A Measurements L x W x D 0x0x0 0x0x0 N/A (cm) Area (cm) : 0 0 N/A Volume (cm) : 0 0 N/A % Reduction in Area: 100.00% 100.00% N/A % Reduction in Volume: 100.00% 100.00% N/A Classification: Grade 1 Grade 2 N/A Exudate Amount: Medium Medium N/A Exudate Type: Serous Serosanguineous N/A Exudate Color: amber red, brown N/A Wound Margin: Flat and Intact Flat and Intact N/A Granulation Amount: Small (1-33%) Large (67-100%) N/A Granulation Quality: Pink Red N/A Necrotic Amount: Large (67-100%) None Present (0%) N/A Exposed Structures: Fat Layer (Subcutaneous Fat Layer (Subcutaneous N/A Tissue) Exposed: Yes Tissue) Exposed: Yes Fascia: No Fascia: No Tendon: No Tendon: No Muscle: No Muscle: No Adam Merritt, Adam Merritt ([id number]) Joint: No Joint: No Bone: No Bone: No Epithelialization: None None N/A  Treatment Notes Electronic Signature(s) Signed: 07/25/2019 12:01:32 PM By: Montey Hora Entered By: Montey Hora on 07/25/2019 11:41:51 Adam Merritt ([id number]) -------------------------------------------------------------------------------- [place] Details Patient Name: Adam Merritt Date of Service: 07/25/2019 11:30 AM Medical Record Number: [id number] Patient Account Number: [id number] Date of Birth/Sex: Sep 04, 1945 (73 y.o.  M) Treating RN: Montey Hora Primary Care Taler Kushner: [name] Other Clinician: Referring Ka Bench: [name] Treating Leelynn Whetsel/Extender: Melburn Hake, [name]: 1 Active Inactive Abuse / Safety / Falls / Self Care Management Nursing Diagnoses: History of Falls Potential for falls Goals: Patient/caregiver will demonstrate safe use of adaptive devices to increase mobility Date Initiated: 07/17/2019 Target Resolution Date: 08/15/2019 Goal Status: Active Interventions: Provide education on fall prevention Notes: Orientation to the Wound Care Program Nursing Diagnoses: Knowledge deficit related to the wound healing center program Goals: Patient/caregiver will verbalize understanding of the [place] Program Date Initiated: 07/17/2019 Target Resolution Date: 08/15/2019 Goal Status: Active Interventions: Provide education on orientation to the wound center Notes: Wound/Skin Impairment Nursing Diagnoses: Impaired tissue integrity Goals: Ulcer/skin breakdown will have a volume reduction of 30% by week 4 Date Initiated: 07/17/2019 Target Resolution Date: 08/15/2019 Goal Status: Active Interventions: Adam Merritt, Adam Merritt ([id number]) Assess ulceration(s) every visit Notes: Electronic Signature(s) Signed: 07/25/2019 12:01:32 PM By: Montey Hora Entered By: Montey Hora on 07/25/2019 11:41:41 Adam Merritt ([id number]) -------------------------------------------------------------------------------- Pain Assessment Details Patient Name: Adam Merritt Date of Service: 07/25/2019 11:30 AM Medical Record Number: [id number] Patient Account Number: [id number] Date of Birth/Sex: [birth year] (74 y.o. M) Treating RN: Army Melia Primary Care Onesty Clair: [name] Other Clinician: Referring Colbie Sliker: [name] Treating Reign Dziuba/Extender: Melburn Hake, [name]: 1 Active Problems Location of Pain Severity and Description of Pain Patient Has Paino  No Site Locations Pain Management and Medication Current Pain Management: Electronic Signature(s) Signed: 07/25/2019 12:53:25 PM By: Army Melia Entered By: Army Melia on 07/25/2019 11:22:38 Adam Merritt ([id number]) -------------------------------------------------------------------------------- Patient/Caregiver Education Details Patient Name: Adam Merritt Date of Service: 07/25/2019 11:30 AM Medical Record Number: [id number] Patient Account Number: [id number] Date of Birth/Gender: May 24, 1946 (74 y.o. M) Treating RN: Montey Hora Primary Care Physician: [name] Other Clinician: Referring Physician: [name] Treating Physician/Extender: Sharalyn Ink in Treatment: 1 Education Assessment Education Provided To: Patient and Caregiver Education Topics Provided Venous: Handouts: Other: leg elevation and pumps Methods: Explain/Verbal Responses: State content correctly Electronic Signature(s) Signed: 07/25/2019 12:01:32 PM By: Montey Hora Entered By: Montey Hora on 07/25/2019 11:43:28 Adam Merritt ([id number]) -------------------------------------------------------------------------------- Wound Assessment Details Patient Name: Adam Merritt Date of Service: 07/25/2019 11:30 AM Medical Record Number: [id number] Patient Account Number: [id number] Date of Birth/Sex: 29-Dec-1945 (74 y.o. M) Treating RN: Montey Hora Primary Care Cambry Spampinato: [name] Other Clinician: Referring Loreen Bankson: [name] Treating Huntington Leverich/Extender: STONE III, [name]: 1 Wound Status Wound Number: 26 Primary Lymphedema Etiology: Wound Location: Right, Lateral Lower Leg Wound Open Wounding Event: Gradually Appeared Status: Date Acquired: 07/07/2019 Comorbid Anemia, Lymphedema, Chronic Obstructive Weeks Of Treatment: 1 History: Pulmonary Disease (COPD), Sleep Apnea, Clustered Wound: No Congestive Heart Failure, Coronary Artery Disease, Hypertension,  Peripheral Venous Disease, Type II Diabetes, Osteoarthritis, Neuropathy, Received Chemotherapy Photos Wound Measurements Length: (cm) 7 Width: (cm) 4 Depth: (cm) 0.1 Area: (cm) 21.991 Volume: (cm) 2.199 % Reduction in Area: 60.7% % Reduction in Volume: 60.7% Epithelialization: Small (1-33%) Wound Description Full Thickness Without Exposed Support Foul Od Classification: Structures Slough/ Wound Margin: Indistinct, nonvisible Exudate Large Amount: Exudate Type: Serous Exudate Color: amber  or After Cleansing: No Fibrino Yes Wound Bed Granulation Amount: Medium (34-66%) Exposed Structure Granulation Quality: Pink Fascia Exposed: No Necrotic Amount: Medium (34-66%) Fat Layer (Subcutaneous Tissue) Exposed: Yes Necrotic Quality: Adherent Slough Tendon Exposed: No Muscle Exposed: No Adam Merritt, Adam Merritt ([id number]) Joint Exposed: No Bone Exposed: No Treatment Notes Wound #26 (Right, Lateral Lower Leg) Notes silvercel, abd, 4 layer wrap with unna to anchor Electronic Signature(s) Signed: 07/25/2019 12:01:32 PM By: Montey Hora Entered By: Montey Hora on 07/25/2019 11:40:40 Adam Merritt ([id number]) -------------------------------------------------------------------------------- Wound Assessment Details Patient Name: Adam Merritt Date of Service: 07/25/2019 11:30 AM Medical Record Number: [id number] Patient Account Number: [id number] Date of Birth/Sex: 1945/07/11 (74 y.o. M) Treating RN: Montey Hora Primary Care Brilee Port: [name] Other Clinician: Referring Gwenna Fuston: [name] Treating Kessler Solly/Extender: Melburn Hake, [name]: 1 Wound Status Wound Number: 27 Primary Lymphedema Etiology: Wound Location: Left, Lateral Lower Leg Wound Open Wounding Event: Gradually Appeared Status: Date Acquired: 07/07/2019 Comorbid Anemia, Lymphedema, Chronic Obstructive Weeks Of Treatment: 1 History: Pulmonary Disease (COPD), Sleep Apnea, Clustered Wound:  No Congestive Heart Failure, Coronary Artery Disease, Hypertension, Peripheral Venous Disease, Type II Diabetes, Osteoarthritis, Neuropathy, Received Chemotherapy Photos Wound Measurements Length: (cm) 4.5 Width: (cm) 8.5 Depth: (cm) 0.1 Area: (cm) 30.041 Volume: (cm) 3.004 % Reduction in Area: -628.6% % Reduction in Volume: -629.1% Epithelialization: Small (1-33%) Wound Description Full Thickness Without Exposed Support Foul Od Classification: Structures Slough/ Wound Margin: Flat and Intact Exudate Large Amount: Exudate Type: Serous Exudate Color: amber or After Cleansing: No Fibrino Yes Wound Bed Granulation Amount: Medium (34-66%) Exposed Structure Granulation Quality: Pink Fascia Exposed: No Necrotic Amount: Medium (34-66%) Fat Layer (Subcutaneous Tissue) Exposed: Yes Necrotic Quality: Adherent Slough Tendon Exposed: No Muscle Exposed: No Adam Merritt, Adam Merritt ([id number]) Joint Exposed: No Bone Exposed: No Treatment Notes Wound #27 (Left, Lateral Lower Leg) Notes silvercel, abd, 4 layer wrap with unna to anchor Electronic Signature(s) Signed: 07/25/2019 12:01:32 PM By: Montey Hora Entered By: Montey Hora on 07/25/2019 11:40:41 Adam Merritt ([id number]) -------------------------------------------------------------------------------- Wound Assessment Details Patient Name: Adam Merritt Date of Service: 07/25/2019 11:30 AM Medical Record Number: [id number] Patient Account Number: [id number] Date of Birth/Sex: 1946-03-15 (74 y.o. M) Treating RN: Montey Hora Primary Care Debbi Strandberg: [name] Other Clinician: Referring Hanah Moultry: [name] Treating Niyanna Asch/Extender: Melburn Hake, [name]: 1 Wound Status Wound Number: 28 Primary Lymphedema Etiology: Wound Location: Left, Anterior Lower Leg Wound Healed - Epithelialized Wounding Event: Gradually Appeared Status: Date Acquired: 07/07/2019 Comorbid Anemia, Lymphedema, Chronic  Obstructive Weeks Of Treatment: 1 History: Pulmonary Disease (COPD), Sleep Apnea, Clustered Wound: No Congestive Heart Failure, Coronary Artery Disease, Hypertension, Peripheral Venous Disease, Type II Diabetes, Osteoarthritis, Neuropathy, Received Chemotherapy Photos Wound Measurements Length: (cm) 0 Width: (cm) 0 Depth: (cm) 0 Area: (cm) 0 Volume: (cm) 0 % Reduction in Area: 100% % Reduction in Volume: 100% Epithelialization: None Wound Description Full Thickness Without Exposed Support Classification: Structures Wound Margin: Flat and Intact Exudate Large Amount: Exudate Type: Serous Exudate Color: amber Foul Odor After Cleansing: No Slough/Fibrino Yes Wound Bed Granulation Amount: Medium (34-66%) Exposed Structure Granulation Quality: Pink Fascia Exposed: No Necrotic Amount: Medium (34-66%) Fat Layer (Subcutaneous Tissue) Exposed: Yes Necrotic Quality: Adherent Slough Tendon Exposed: No Muscle Exposed: No Adam Merritt, Adam Merritt ([id number]) Joint Exposed: No Bone Exposed: No Electronic Signature(s) Signed: 07/25/2019 12:01:32 PM By: Montey Hora Entered By: Montey Hora on 07/25/2019 11:40:41 Adam Merritt ([id number]) -------------------------------------------------------------------------------- Wound Assessment Details Patient Name: Adam Merritt Date of Service: 07/25/2019  11:30 AM Medical Record Number: [id number] Patient Account Number: [id number] Date of Birth/Sex: 08/29/1945 (74 y.o. M) Treating RN: Montey Hora Primary Care Celine Dishman: [name] Other Clinician: Referring Tanee Henery: [name] Treating Heath Badon/Extender: STONE III, [name]: 1 Wound Status Wound Number: 29 Primary Diabetic Wound/Ulcer of the Lower Extremity Etiology: Wound Location: Left, Distal, Anterior Lower Leg Wound Healed - Epithelialized Wounding Event: Gradually Appeared Status: Date Acquired: 07/17/2019 Comorbid Anemia, Lymphedema, Chronic  Obstructive Weeks Of Treatment: 1 History: Pulmonary Disease (COPD), Sleep Apnea, Clustered Wound: No Congestive Heart Failure, Coronary Artery Disease, Hypertension, Peripheral Venous Disease, Type II Diabetes, Osteoarthritis, Neuropathy, Received Chemotherapy Photos Wound Measurements Length: (cm) 0 % Re Width: (cm) 0 % Re Depth: (cm) 0 Epit Area: (cm) 0 Volume: (cm) 0 duction in Area: 100% duction in Volume: 100% helialization: None Wound Description Classification: Grade 1 Wound Margin: Flat and Intact Exudate Amount: Medium Exudate Type: Serous Exudate Color: amber Foul Odor After Cleansing: No Slough/Fibrino Yes Wound Bed Granulation Amount: Small (1-33%) Exposed Structure Granulation Quality: Pink Fascia Exposed: No Necrotic Amount: Large (67-100%) Fat Layer (Subcutaneous Tissue) Exposed: Yes Necrotic Quality: Adherent Slough Tendon Exposed: No Muscle Exposed: No Joint Exposed: No Bone Exposed: No Adam Merritt, Adam Merritt ([id number]) Electronic Signature(s) Signed: 07/25/2019 12:01:32 PM By: Montey Hora Entered By: Montey Hora on 07/25/2019 11:40:42 Adam Merritt ([id number]) -------------------------------------------------------------------------------- Wound Assessment Details Patient Name: Adam Merritt Date of Service: 07/25/2019 11:30 AM Medical Record Number: [id number] Patient Account Number: [id number] Date of Birth/Sex: 30-Jun-1945 (74 y.o. M) Treating RN: Montey Hora Primary Care Wilbur Labuda: [name] Other Clinician: Referring Ardell Makarewicz: [name] Treating Suraiya Dickerson/Extender: STONE III, [name]: 1 Wound Status Wound Number: 30 Primary Diabetic Wound/Ulcer of the Lower Extremity Etiology: Wound Location: Right Toe Second Wound Healed - Epithelialized Wounding Event: Gradually Appeared Status: Date Acquired: 06/06/2019 Comorbid Anemia, Lymphedema, Chronic Obstructive Weeks Of Treatment: 1 History: Pulmonary Disease (COPD),  Sleep Apnea, Clustered Wound: No Congestive Heart Failure, Coronary Artery Disease, Hypertension, Peripheral Venous Disease, Type II Diabetes, Osteoarthritis, Neuropathy, Received Chemotherapy Photos Wound Measurements Length: (cm) 0 Width: (cm) 0 Depth: (cm) 0 Area: (cm) 0 Volume: (cm) 0 % Reduction in Area: 100% % Reduction in Volume: 100% Epithelialization: None Wound Description Classification: Grade 2 Foul Odor Wound Margin: Flat and Intact Slough/Fi Exudate Amount: Medium Exudate Type: Serosanguineous Exudate Color: red, brown After Cleansing: No brino No Wound Bed Granulation Amount: Large (67-100%) Exposed Structure Granulation Quality: Red Fascia Exposed: No Necrotic Amount: None Present (0%) Fat Layer (Subcutaneous Tissue) Exposed: Yes Tendon Exposed: No Muscle Exposed: No Joint Exposed: No Bone Exposed: No Adam Merritt, Adam Merritt ([id number]) Electronic Signature(s) Signed: 07/25/2019 12:01:32 PM By: Montey Hora Entered By: Montey Hora on 07/25/2019 11:41:35 Adam Merritt ([id number]) -------------------------------------------------------------------------------- Vitals Details Patient Name: Adam Merritt Date of Service: 07/25/2019 11:30 AM Medical Record Number: [id number] Patient Account Number: [id number] Date of Birth/Sex: 01/17/1946 (74 y.o. M) Treating RN: Army Melia Primary Care Yoshito Gaza: [name] Other Clinician: Referring Lariah Fleer: [name] Treating Latish Toutant/Extender: Melburn Hake, [name]: 1 Vital Signs Time Taken: 11:22 Temperature (F): 98.2 Height (in): 66 Pulse (bpm): 65 Weight (lbs): 292 Respiratory Rate (breaths/min): 16 Body Mass Index (BMI): 47.1 Blood Pressure (mmHg): 154/59 Reference Range: 80 - 120 mg / dl Electronic Signature(s) Signed: 07/25/2019 12:53:25 PM By: Army Melia Entered By: Army Melia on 07/25/2019 11:24:14

## 2019-07-27 NOTE — Progress Notes (Addendum)
Adam Merritt, Adam Merritt ([id number]) Visit Report for 07/25/2019 Chief Complaint Document Details Patient Name: Adam Merritt, Adam Merritt Date of Service: 07/25/2019 11:30 AM Medical Record Number: [id number] Patient Account Number: [id number] Date of Birth/Sex: February 26, 1946 (73 y.o. M) Treating RN: Montey Hora Primary Care Provider: [name] Other Clinician: Referring Provider: [name] Treating Provider/Extender: Melburn Hake, Charlyn Vialpando [name]: 1 Information Obtained from: Patient Chief Complaint Bilateral LE Ulcers and right second toe ulcer Electronic Signature(s) Signed: 07/28/2019 11:24:57 AM By: Worthy Keeler PA-C Entered By: Worthy Keeler on 07/28/2019 11:24:56 Adam Merritt ([id number]) -------------------------------------------------------------------------------- HPI Details Patient Name: Adam Merritt Date of Service: 07/25/2019 11:30 AM Medical Record Number: [id number] Patient Account Number: [id number] Date of Birth/Sex: Mar 09, 1946 (73 y.o. M) Treating RN: Montey Hora Primary Care Provider: [name] Other Clinician: Referring Provider: [name] Treating Provider/Extender: Melburn Hake, Lorraine Terriquez [name]: 1 History of Present Illness HPI Description: 10/18/17-He is here for initial evaluation of bilateral lower extremity ulcerations in the presence of venous insufficiency and lymphedema. He has been seen by vascular medicine in the past, Dr. Lucky Cowboy, last seen in 2016. He does have a history of abnormal ABIs, which is to be expected given his lymphedema and venous insufficiency. According to Epic, it appears that all attempts for arterial evaluation and/or angiography were not follow through with by patient. He does have a history of being seen in lymphedema clinic in 2018, stopped going approximately 6 months ago stating "it didn't do any good". He does not have lymphedema pumps, he does not have custom fit compression wrap/stockings. He is diabetic and his recent  A1c last month was 7.6. He admits to chronic bilateral lower extremity pain, no change in pain since blister and ulceration development. He is currently being treated with Levaquin for bronchitis. He has home health and we will continue. 10/25/17-He is here in follow-up evaluation for bilateral lower extremity ulcerationssubtle he remains on Levaquin for bronchitis. Right lower extremity with no evidence of drainage or ulceration, persistent left lower extremity ulceration. He states that home health has not been out since his appointment. He went to [facility] on Tuesday, studies revealed: RIGHT ABI 0.9, TBI 0.6 LEFT ABI 1.1, TBI 0.6 with triphasic flow bilaterally. We will continue his same treatment plan. He has been educated on compression therapy and need for elevation. He will benefit from lymphedema pumps 11/01/17-He is here in follow-up evaluation for left lower extremity ulcer. The right lower extremity remains healed. He has home health services, but they have not been out to see the patient for 2-3 weeks. He states it home health physical therapy changed his dressing yesterday after therapy; he placed Ace wrap compression. We are still waiting for lymphedema pumps, reordered d/t need for company change. 11/08/17-He is here in follow-up evaluation for left lower extremity ulcer. It is improved. Edema is significantly improved with compression therapy. We will continue with same treatment plan and he will follow-up next week. No word regarding lymphedema pumps 11/15/17-He is here in follow-up evaluation for left lower extremity ulcer. He is healed and will be discharged from wound care services. I have reached out to [facility] regarding his lymphedema pumps. They have been unable to reach the patient; the contact number they had with the patient's wife's cell phone and she has not answered any unrecognized calls. Contact should be made today, trial planned for next week;  [facility] will continue to follow 11/27/17 on evaluation today patient has multiple blistered  areas over the right lower extremity his left lower extremity appears to be doing okay. These blistered areas show signs of no infection which is great news. With that being said he did have some necrotic skin overlying which was mechanically debrided away with saline and gauze today without complication. Overall post debridement the wounds appear to be doing better but in general his swelling seems to be increased. This is obviously not good news. I think this is what has given rise to the blisters. 12/04/17 on evaluation today patient presents for follow-up concerning his bilateral lower extremity edema in the right lower extremity ulcers. He has been tolerating the dressing changes without complication. With that being said he has had no real issues with the wraps which is also good news. Overall I'm pleased with the progress he's been making. 12/11/17 on evaluation today patient appears to be doing rather well in regard to his right lateral lower extremity ulcer. He's been tolerating the dressing changes without complication. Fortunately there does not appear to be any evidence of infection at this time. Overall I'm pleased with the progress that is being made. Unfortunately he has been in the hospital due to having what sounds to be a stomach virus/flu fortunately that is starting to get better. 12/18/17 on evaluation today patient actually appears to be doing very well in regard to his bilateral lower extremities the swelling is under fairly good control his lymphedema pumps are still not up and running quite yet. With that being said he does have several areas of opening noted as far as wounds are concerned mainly over the left lower extremity. With that being said I do believe once he gets lymphedema pumps this would least help you mention some the fluid and preventing this from occurring.  Hopefully that will be set up soon sleeves are Artie in place at his home he just waiting for the machine. 12/25/17 on evaluation today patient actually appears to be doing excellent in fact all of his ulcers appear to have resolved his Adam Merritt, Adam Merritt. ([id number]) legs appear very well. I do think he needs compression stockings we have discussed this and they are actually going to go to [place] today to elastic therapy to get this fitted for him. I think that is definitely a good thing to do. Readmission: 04/09/18 upon evaluation today this patient is seen for readmission due to bilateral lower extremity lymphedema. He has significant swelling of his extremities especially on the left although the right is also swollen he has weeping from both sides. There are no obvious open wounds at this point. Fortunately he has been doing fairly well for quite a bit of time since I last saw him. Nonetheless unfortunately this seems to have reopened and is giving quite a bit of trouble. He states this began about a week ago when he first called [place] to get in to be seen. No fevers, chills, nausea, or vomiting noted at this time. He has not been using his lymphedema pumps due to the fact that they won't fit on his leg at this point likewise is also not been using his compression for essentially the same reason. 04/16/18 upon evaluation today patient actually appears to be doing a little better in regard to the fluid in his bilateral lower extremities. With that being said he's had three falls since I saw him last week. He also states that he's been feeling very poorly. I was concerned last week and feel like that the concern  is still there as far as the congestion in his chest is concerned he seems to be breathing about the same as last week but again he states he's very weak he's not even able to walk further than from the chair to the door. His wife had to buy a wheelchair just to be able to get them out of the  house to get to the appointment today. This has me very concerned. 04/23/18 on evaluation today patient actually appears to be doing much better than last week's evaluation. At that time actually had to transport him to the ER via EMS and he subsequently was admitted for acute pulmonary edema, acute renal failure, and acute congestive heart failure. Fortunately he is doing much better. Apparently they did dialyze him and were able to take off roughly 35 pounds of fluid. Nonetheless he is feeling much better both in regard to his breathing and he's able to get around much better at this time compared to previous. Overall I'm very happy with how things are at this time. There does not appear to be any evidence of infection currently. No fevers, chills, nausea, or vomiting noted at this time. 04/30/2018 patient seen today for follow-up and management of bilateral lower extremity lymphedema. He did express being more sad today than usual due to the recent loss of his dog. He states that he has been compliant with using the lymphedema pumps. However he does admit a minute over the last 2-3 days he has not been using the pumps due to the recent loss of his dog. At this time there is no drainage or open wounds to his lower extremities. The left leg edema is measuring smaller today. Still has a significant amount of edema on bilateral lower extremities With dry flaky skin. He will be referred to the lymphedema clinic for further management. Will continue 3 layer compression wraps and follow-up in 1-2 weeks.Denies any pain, fever, chairs recently. No recent falls or injuries reported during this visit. 05/07/18 on evaluation today patient actually appears to be doing very well in regard to his lower extremities in general all things considered. With that being said he is having some pain in the legs just due to the amount of swelling. He does have an area where he had a blister on the left lateral lower  extremity this is open at this point other than that there's nothing else weeping at this time. 05/14/18 on evaluation today patient actually appears to be doing excellent all things considered in regard to his lower extremities. He still has a couple areas of weeping on each leg which has continued to be the issue for him. He does have an appointment with the lymphedema clinic although this isn't until February 2020. That was the earliest they had. In the meantime he has continued to tolerate the compression wraps without complication. 05/28/18 on evaluation today patient actually appears to be doing more poorly in regard to his left lower extremity where he has a wound open at this point. He also had a fall where he subsequently injured his right great toe which has led to an open wound at the site unfortunately. He has been tolerating the dressing changes without complication in general as far as the wraps are concerned that he has not been putting any dressing on the left 1st toe ulcer site. 06/11/18 on evaluation today patient appears to be doing much worse in regard to his bilateral lower extremity ulcers. He has been tolerating the  dressing changes without complication although his legs have not been wrapped more recently. Overall I am not very pleased with the way his legs appear. I do believe he needs to be back in compression wraps he still has not received his compression wraps from the [facility] hospital as of yet. 06/18/18 on evaluation today patient actually appears to be doing significantly better than last time I saw him. He has been tolerating the compression wraps without complication in the circumferential ulcers especially appear to be doing much better. His toe ulcer on the right in regard to the great toe is better although not as good as the legs in my pinion. No fevers chills noted Adam Merritt, Adam Merritt ([id number]) 07/02/18 on evaluation today patient appears to be doing much better in  regard to his lower extremity ulcers. Unfortunately since I last saw him he's had the distal portion of his right great toe if he dated it sounds as if this actually went downhill very quickly. I had only seen him a few days prior and the toe did not appear to be infected at that point subsequently became infected very rapidly and it was decided by the surgeon that the distal portion of the toe needed to be removed. The patient seems to be doing well in this regard he tells me. With that being said his lower extremities are doing better from the standpoint of the wounds although he is significantly swollen at this point. 07/09/18 on evaluation today patient appears to be doing better in regard to the wounds on his lower extremities. In fact everything is almost completely healed he is just a small area on the left posterior lower extremity that is open at this point. 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 With that being said he needs some kind of compression it does sound like he may not be wearing his compression, that is the wraps, during the entire time between when he's here visit to visit. Apparently his wife took the current one off because it began to "fall apart". 07/16/18 on evaluation today patient appears to be extremely swollen especially in regard to his left lower extremity unfortunately. He also has a new skin tear over the left lower extremity and there's a smaller area on the right lower extremity as well. Unfortunately this seems to be due in part to blistering and fluid buildup in his leg. He did get the reduction wraps that were ordered by the [facility] hospital for him to go to lymphedema clinic. With that being said his wounds on the legs have not healed to the point to where they would likely accept  them as a patient lymphedema clinic currently. We need to try to get this to heal. With that being said he's been taking his wraps off which is not doing him any favors at this point. In fact this is probably quite counterproductive compared to what needs to occur. We will likely need to increase to a four layer compression wrap and continue to also utilize elevation and he has to keep the wraps on not take them off as he's been doing currently hasn't had a wrap on since Saturday. 07/23/18 on evaluation today patient appears to be doing much better in regard to his bilateral lower extremities. In fact his left lower extremity which was the  largest is actually 15 cm smaller today compared to what it was last time he was here in our clinic. This is obviously good news after just one week. Nonetheless the differences he actually kept the wraps on during the entire week this time. That's not typical for him. I do believe he understands a little bit better now the severity of the situation and why it's important for him to keep these wraps on. 07/30/18 on evaluation today patient actually appears to be doing rather well in regard to his lower extremities. His legs are much smaller than they have been in the past and he actually has only one very small rudder superficial region remaining that is not closed on the left lateral/posterior lower extremity even this is almost completely close. I do believe likely next week he will be healed without any complications. I do think we need to continue the wraps however this seems to be beneficial for him. I also think it may be a good time for [place] to go ahead and see about getting the appointment with the lymphedema clinic which is supposed to be made for him in order to keep this moving along and hopefully get them into compression wraps that will in the end help him to remain healed. 08/06/18 on evaluation today patient actually appears to be doing very well in  regard as bilateral lower extremities. In fact his wounds appear to be completely healed at this time. 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 He is in the process of getting appointment with the lymphedema clinic we have made this referral were just waiting to hear back on the schedule time. We need to follow up on that today as well. 08/13/18 on evaluation today patient actually appears to be doing very well in regard to his bilateral lower extremities there are no open wounds at this point. We are gonna go ahead and see about ordering the Velcro compression wraps for him had a discussion with them about [place] doing it versus the [place] they feel like they can definitely afford going ahead and get the wraps themselves and they would prefer to try to avoid having to go to the lymphedema clinic if it all possible which I completely understand. As long as he has good compression I'm okay either way. 3/17//20 on evaluation today patient actually appears to be doing well in regard to his bilateral lower extremity ulcers. He has been tolerating the dressing changes without complication specifically the compression wraps. Overall is had no issues my fingers finding that I see at this point is that he is having trouble with constipation. He tells me he has not been able to go to the bathroom for about six days. He's taken to over-the-counter oral laxatives unfortunately this is not helping. He has not contacted his doctor. 08/27/18 on evaluation today patient appears to be doing fairly well in regard to his lower extremities at this point. There does not appear to be any new altars is swelling is very well controlled. We are still waiting for his Velcro compression wraps to arrive that should be sometime in the next week is Artie sent the check for these. 09/03/18 on evaluation today patient appears to be doing excellent in regard to his bilateral lower  extremities which he shows Adam Merritt, Adam Merritt. ([id number]) no signs of wound openings in regard to this point. He does have his Velcro compression wraps which did arrive in the  mail since I saw him last week. Overall he is doing excellent in my pinion. 09/13/18 on evaluation today patient appears to be doing very well currently in regard to the overall appearance of his bilateral lower extremities although he's a little bit more swollen than last time we saw him. At that point he been discharged without any open wounds. Nonetheless he has a small open wound on the posterior left lower extremity with some evidence of cellulitis noted as well. Fortunately I feel like he has made good progress overall with regard to his lower extremities from were things used to be. 09/20/18 on evaluation today patient actually appears to be doing much better. The erythematous lower extremity is improving wound itself which is still open appears to be doing much better as far as but appearance as well as pain is concerned overall very pleased in this regard. There's no signs of active infection at this time. 09/27/18 on evaluation today patient's wounds on the lower extremity actually appear to be doing fairly well at this time which is good news. There is no evidence of active infection currently and again is just as left lower extremity were there any wounds at this point anyway. I believe they may be completely healed but again I'm not 100% sure based on evaluation today. I think one more week of observation would likely be a good idea. 10/04/18 on evaluation today patient actually appears to be doing excellent in regard to the left lower extremity which actually appears to be completely healed as of today. Unfortunately he's been having issues with his right lower extremity have a new wound that has opened. Fortunately there's no evidence of active infection at this time which is good news. 10/10/18 upon evaluation today  patient actually appears to be doing a little bit worse with the new open area on his right posterior lower extremity. He's been tolerating the dressing changes without complication. Right now we been using his compression wraps although I think we may need to switch back to actually performing bilateral compression wraps are in the clinic. No fevers, chills, nausea, or vomiting noted at this time. 10/17/18 on evaluation today patient actually appears to be doing quite well in regard to his bilateral lower extremity ulcers. He has been tolerating the reps without complication although he would prefer not to rewrap his legs as of today. Fortunately there's no signs of active infection which is good news. No fevers, chills, nausea, or vomiting noted at this time. 10/24/18 on evaluation today patient appears to be doing very well in regard to his lower extremities. His right lower extremity is shown signs of healing and his left lower [place] though not healed appears to be improving which is excellent news. Overall very pleased with how things seem to be progressing at this time. The patient is likewise happy to hear this. His Velcro compression wraps however have not been put on properly will gonna show his wife how to do that properly today. 10/31/18 on evaluation today patient appears to be doing more poorly in regard to his left lower extremity in particular although both lower extremities actually are showing some signs of being worse than my previous evaluation. Unfortunately I'm just not sure that his compression stockings even with the use of the compression/lymphedema pumps seem to be controlling this well. Upon further questioning he tells me that he also is not able to lie flat in the bed due to his congestive heart failure and difficulty breathing.  For that reason he sleeps in his recliner. To make matters worse his recliner also cannot even hold his legs up so instead of even being somewhat  elevated they pretty much hang down to the floor. This is the way he sleeps each night which is definitely counterproductive to everything else that were attempting to do from the standpoint of controlling his fluid. Nonetheless I think that he potentially could benefit from a hospital bed although this would be something that his primary care provider would likely have to order since anything that is order on our side has to be directly related to wound care and again the hospital bed is not necessarily a direct relation to although I think it does contribute to his overall wound status on his lower extremities. 11/07/18 on evaluation today patient appears to be doing better in regard to his bilateral lower extremity. He's been tolerating the dressing changes without complication. We did in the interim since I last saw him switch to just using extras orbiting new alginate and that seems to have done much better for him. I'm very pleased with the overall progress that is made. 11/14/18 on evaluation today patient appears to be redoing rather well in regard to his left lower extremity ulcers which are the only ones remaining at this point. Fortunately there's no signs of active infection at this time which is good news. Overall been very pleased with how things seem to be progressing currently. No fevers, chills, nausea, or vomiting noted at this time. 11/20/17 on evaluation today patient actually appears to be doing quite well in regard to his lower extremities on the left on the right he has several blisters that showed up although there's some question about whether or not he has had his broker compression wraps on like he was supposed to or not. Fortunately there's no signs of active infection at this time which is good news. Unfortunately though he is doing well from the standpoint of the left leg the right leg is not doing as well again this Adam Merritt, Adam Merritt. ([id number]) is when we did not wrap last  week. 11/28/18 on evaluation today patient appears to be doing well in regard to his bilateral lower extremities is left appears to be healed is right is not healed but is very close to being so. Overall very pleased with how things seem to be progressing. Patient is likewise happy that things are doing well. 12/09/18 on evaluation today patient actually appears to be completely healed which is excellent news. He actually seems to be doing well in regard to the swelling of the bilateral lower extremities which is also great news. Overall very pleased with how things seem to be progressing. Readmission: 03/18/2019 upon evaluation today patient presents for reevaluation concerning issues that he is having with his left lower extremity where he does have a wound noted upon inspection today. He also has a wound on the right second toe on the tip where it is apparently been rubbing in his shoe I did look at issues and you can actually see where this has been occurring as well. Fortunately there is no signs of infection with regard to the toe necessarily although it is very swollen compared to normal that does have me somewhat concerned about the possibility of further evaluation for infection/osteomyelitis. I would recommend an x-ray to start with. Otherwise he states that it is been 1-2 weeks that he has had the draining in regard to left lower extremity he  does not know how this happened he has been wearing his compression appropriately and I do see that as well today but nonetheless I do think that he needs to continue to be very cautious with regard to elevation as well. 03/25/2019 on evaluation today patient appears to be doing much better in regard to his lower extremity at this time. That is on the left. He did culture positive for Pseudomonas but the good news is he seems to be doing much better the gentamicin we applied topically seems to have done a great job for him. There is no signs of active  infection at this time systemically and locally he is doing much better. No fevers, chills, nausea, vomiting, or diarrhea. In regard to his right toe this seems to be doing much better there is very little pressure noted at this point I did clean up some of the callus today around the edges of the wound as well as the surface of the wound but to be honest he is progressing quite nicely. 10/30; the area on the left anterior lower tibia area is healed over. His edema control is adequate even though his use of the compression pumps seems very intermittent. He has a juxta lite stocking on the right leg and a think there is 1 for the left leg at home. His wife is putting these on. He has an area on the plantar right second toe which is a hammertoe they are trying to offload this 04/08/2019 on evaluation today patient appears to be doing well in regard to his lower extremities which is good news. We are seeing him today for his toe ulcer which is giving him a little bit more trouble but again still seems to be doing better at this point which is good news. He is going require some sharp debridement in regard to the toe today however. 04/15/2019 on evaluation today patient actually appears to be doing quite well with regard to his toe ulcer. I am very pleased with how things are doing in that regard. With regard to his lower extremity edema in general he tells me he is not been using the lymphedema pumps regularly although he does not use them. I think that he needs to use them more regularly he does have a small blister on the left lower extremity this is not open at this point but obviously it something that can get worse if he does not keep the compression going and the pumps going as well. 04/22/2019 on evaluation today patient appears to be doing well with regard to his toe ulcer. He has been tolerating the dressing changes without complication. This is measuring slightly smaller this week as compared  to last week. Fortunately there is no signs of active infection at this time. 05/06/2019 on evaluation today patient actually appears to be doing excellent. In fact his toe appears to be completely healed which is great news. There is no signs of active infection at this time. Readmission: Patient presents today for follow-up after having been discharged at the beginning of December 2020. He states that in recent weeks things have reopened and has been having more trouble at this point. With that being said fortunately there is no signs of active infection at this time. No fever chills noted. Nonetheless he also does have an issue with one of his toes as well that seems to be somewhat this is on the right foot second toe. 07/25/2019 upon evaluation today patient's lower extremities appear  to be doing excellent bilaterally. I really feel like he is showing signs of great improvement and in fact I think he is close to healing which is great news. There is no signs of active infection at this time. No fevers, chills, nausea, vomiting, or diarrhea. Adam Merritt, Adam Merritt ([id number]) Electronic Signature(s) Signed: 07/28/2019 11:37:21 AM By: Worthy Keeler PA-C Entered By: Worthy Keeler on 07/28/2019 11:37:21 Adam Merritt, Adam Merritt ([id number]) -------------------------------------------------------------------------------- Physical Exam Details Patient Name: Adam Merritt Date of Service: 07/25/2019 11:30 AM Medical Record Number: [id number] Patient Account Number: [id number] Date of Birth/Sex: 03-03-1946 (73 y.o. M) Treating RN: Montey Hora Primary Care Provider: [name] Other Clinician: Referring Provider: [name] Treating Provider/Extender: STONE III, Janayah Zavada [name]: 1 Constitutional Well-nourished and well-hydrated in no acute distress. Respiratory normal breathing without difficulty. Psychiatric this patient is able to make decisions and demonstrates good insight into disease  process. Alert and Oriented x 3. pleasant and cooperative. Notes Patient's wound bed showed signs of excellent epithelization at this point in regard to all wounds of the bilateral lower extremities he seems to be doing very well in this regard. Fortunately there is no signs of active infection currently. Electronic Signature(s) Signed: 07/28/2019 11:37:41 AM By: Worthy Keeler PA-C Entered By: Worthy Keeler on 07/28/2019 11:37:40 Adam Merritt ([id number]) -------------------------------------------------------------------------------- Physician Orders Details Patient Name: Adam Merritt Date of Service: 07/25/2019 11:30 AM Medical Record Number: [id number] Patient Account Number: [id number] Date of Birth/Sex: 12/09/1945 (73 y.o. M) Treating RN: Montey Hora Primary Care Provider: [name] Other Clinician: Referring Provider: [name] Treating Provider/Extender: Melburn Hake, Maitri Schnoebelen [name]: 1 Verbal / Phone Orders: No Diagnosis Coding Wound Cleansing Wound #26 Right,Lateral Lower Leg o Cleanse wound with mild soap and water - in office Wound #27 Left,Lateral Lower Leg o Cleanse wound with mild soap and water - in office Primary Wound Dressing Wound #26 Right,Lateral Lower Leg o Silver Alginate Wound #27 Left,Lateral Lower Leg o Silver Alginate Secondary Dressing Wound #26 Right,Lateral Lower Leg o ABD pad Wound #27 Left,Lateral Lower Leg o ABD pad Dressing Change Frequency Wound #26 Right,Lateral Lower Leg o Change dressing every week Wound #27 Left,Lateral Lower Leg o Change dressing every week Follow-up Appointments Wound #26 Right,Lateral Lower Leg o Return Appointment in 1 week. Wound #27 Left,Lateral Lower Leg o Return Appointment in 1 week. Edema Control Wound #26 Right,Lateral Lower Leg o 4 Layer Compression System - Bilateral Wound #27 Left,Lateral Lower Leg o 4 Layer Compression System - Bilateral KENSLEY, LARES  ([id number]) Electronic Signature(s) Signed: 07/25/2019 12:01:32 PM By: Montey Hora Signed: 07/27/2019 11:36:00 PM By: Worthy Keeler PA-C Entered By: Montey Hora on 07/25/2019 11:42:49 Adam Merritt ([id number]) -------------------------------------------------------------------------------- Problem List Details Patient Name: Adam Merritt Date of Service: 07/25/2019 11:30 AM Medical Record Number: [id number] Patient Account Number: [id number] Date of Birth/Sex: 23-Jun-1945 (73 y.o. M) Treating RN: Montey Hora Primary Care Provider: [name] Other Clinician: Referring Provider: [name] Treating Provider/Extender: Melburn Hake, Said Rueb [name]: 1 Active Problems ICD-10 Evaluated Encounter Code Description Active Date Today Diagnosis I89.0 Lymphedema, not elsewhere classified 07/17/2019 No Yes L97.512 Non-pressure chronic ulcer of other part of right foot with fat 07/17/2019 No Yes layer exposed L97.822 Non-pressure chronic ulcer of other part of left lower leg with 07/17/2019 No Yes fat layer exposed E11.621 Type 2 diabetes mellitus with foot ulcer 07/17/2019 No Yes L97.511 Non-pressure chronic ulcer of other part of right foot limited to 07/17/2019  No Yes breakdown of skin E66.09 Other obesity due to excess calories 07/17/2019 No Yes Inactive Problems Resolved Problems Electronic Signature(s) Signed: 07/28/2019 11:24:46 AM By: Worthy Keeler PA-C Entered By: Worthy Keeler on 07/28/2019 11:24:45 Adam Merritt ([id number]) -------------------------------------------------------------------------------- Progress Note Details Patient Name: Adam Merritt Date of Service: 07/25/2019 11:30 AM Medical Record Number: [id number] Patient Account Number: [id number] Date of Birth/Sex: September 15, 1945 (73 y.o. M) Treating RN: Montey Hora Primary Care Provider: [name] Other Clinician: Referring Provider: [name] Treating Provider/Extender: Melburn Hake, Savvas Roper Weeks  in Treatment: 1 Subjective Chief Complaint Information obtained from Patient Bilateral LE Ulcers and right second toe ulcer History of Present Illness (HPI) 10/18/17-He is here for initial evaluation of bilateral lower extremity ulcerations in the presence of venous insufficiency and lymphedema. He has been seen by vascular medicine in the past, Dr. Lucky Cowboy, last seen in 2016. He does have a history of abnormal ABIs, which is to be expected given his lymphedema and venous insufficiency. According to Epic, it appears that all attempts for arterial evaluation and/or angiography were not follow through with by patient. He does have a history of being seen in lymphedema clinic in 2018, stopped going approximately 6 months ago stating "it didn't do any good". He does not have lymphedema pumps, he does not have custom fit compression wrap/stockings. He is diabetic and his recent A1c last month was 7.6. He admits to chronic bilateral lower extremity pain, no change in pain since blister and ulceration development. He is currently being treated with Levaquin for bronchitis. He has home health and we will continue. 10/25/17-He is here in follow-up evaluation for bilateral lower extremity ulcerationssubtle he remains on Levaquin for bronchitis. Right lower extremity with no evidence of drainage or ulceration, persistent left lower extremity ulceration. He states that home health has not been out since his appointment. He went to [facility] on Tuesday, studies revealed: RIGHT ABI 0.9, TBI 0.6 LEFT ABI 1.1, TBI 0.6 with triphasic flow bilaterally. We will continue his same treatment plan. He has been educated on compression therapy and need for elevation. He will benefit from lymphedema pumps 11/01/17-He is here in follow-up evaluation for left lower extremity ulcer. The right lower extremity remains healed. He has home health services, but they have not been out to see the patient for 2-3 weeks. He  states it home health physical therapy changed his dressing yesterday after therapy; he placed Ace wrap compression. We are still waiting for lymphedema pumps, reordered d/t need for company change. 11/08/17-He is here in follow-up evaluation for left lower extremity ulcer. It is improved. Edema is significantly improved with compression therapy. We will continue with same treatment plan and he will follow-up next week. No word regarding lymphedema pumps 11/15/17-He is here in follow-up evaluation for left lower extremity ulcer. He is healed and will be discharged from wound care services. I have reached out to [facility] regarding his lymphedema pumps. They have been unable to reach the patient; the contact number they had with the patient's wife's cell phone and she has not answered any unrecognized calls. Contact should be made today, trial planned for next week; [facility] will continue to follow 11/27/17 on evaluation today patient has multiple blistered areas over the right lower extremity his left lower extremity appears to be doing okay. These blistered areas show signs of no infection which is great news. With that being said he did have some necrotic skin overlying which was mechanically debrided  away with saline and gauze today without complication. Overall post debridement the wounds appear to be doing better but in general his swelling seems to be increased. This is obviously not good news. I think this is what has given rise to the blisters. 12/04/17 on evaluation today patient presents for follow-up concerning his bilateral lower extremity edema in the right lower extremity ulcers. He has been tolerating the dressing changes without complication. With that being said he has had no real issues with the wraps which is also good news. Overall I'm pleased with the progress he's been making. 12/11/17 on evaluation today patient appears to be doing rather well in regard to his right  lateral lower extremity ulcer. He's been tolerating the dressing changes without complication. Fortunately there does not appear to be any evidence of infection at this time. Overall I'm pleased with the progress that is being made. Unfortunately he has been in the hospital due to having what sounds to be a stomach virus/flu fortunately that is starting to get better. 12/18/17 on evaluation today patient actually appears to be doing very well in regard to his bilateral lower extremities the Adam Merritt, Adam Merritt. ([id number]) swelling is under fairly good control his lymphedema pumps are still not up and running quite yet. With that being said he does have several areas of opening noted as far as wounds are concerned mainly over the left lower extremity. With that being said I do believe once he gets lymphedema pumps this would least help you mention some the fluid and preventing this from occurring. Hopefully that will be set up soon sleeves are Artie in place at his home he just waiting for the machine. 12/25/17 on evaluation today patient actually appears to be doing excellent in fact all of his ulcers appear to have resolved his legs appear very well. I do think he needs compression stockings we have discussed this and they are actually going to go to [facility] today to elastic therapy to get this fitted for him. I think that is definitely a good thing to do. Readmission: 04/09/18 upon evaluation today this patient is seen for readmission due to bilateral lower extremity lymphedema. He has significant swelling of his extremities especially on the left although the right is also swollen he has weeping from both sides. There are no obvious open wounds at this point. Fortunately he has been doing fairly well for quite a bit of time since I last saw him. Nonetheless unfortunately this seems to have reopened and is giving quite a bit of trouble. He states this began about a week ago when he first called [place] to  get in to be seen. No fevers, chills, nausea, or vomiting noted at this time. He has not been using his lymphedema pumps due to the fact that they won't fit on his leg at this point likewise is also not been using his compression for essentially the same reason. 04/16/18 upon evaluation today patient actually appears to be doing a little better in regard to the fluid in his bilateral lower extremities. With that being said he's had three falls since I saw him last week. He also states that he's been feeling very poorly. I was concerned last week and feel like that the concern is still there as far as the congestion in his chest is concerned he seems to be breathing about the same as last week but again he states he's very weak he's not even able to walk further than from the  chair to the door. His wife had to buy a wheelchair just to be able to get them out of the house to get to the appointment today. This has me very concerned. 04/23/18 on evaluation today patient actually appears to be doing much better than last week's evaluation. At that time actually had to transport him to the ER via EMS and he subsequently was admitted for acute pulmonary edema, acute renal failure, and acute congestive heart failure. Fortunately he is doing much better. Apparently they did dialyze him and were able to take off roughly 35 pounds of fluid. Nonetheless he is feeling much better both in regard to his breathing and he's able to get around much better at this time compared to previous. Overall I'm very happy with how things are at this time. There does not appear to be any evidence of infection currently. No fevers, chills, nausea, or vomiting noted at this time. 04/30/2018 patient seen today for follow-up and management of bilateral lower extremity lymphedema. He did express being more sad today than usual due to the recent loss of his dog. He states that he has been compliant with using the lymphedema pumps.  However he does admit a minute over the last 2-3 days he has not been using the pumps due to the recent loss of his dog. At this time there is no drainage or open wounds to his lower extremities. The left leg edema is measuring smaller today. Still has a significant amount of edema on bilateral lower extremities With dry flaky skin. He will be referred to the lymphedema clinic for further management. Will continue 3 layer compression wraps and follow-up in 1-2 weeks.Denies any pain, fever, chairs recently. No recent falls or injuries reported during this visit. 05/07/18 on evaluation today patient actually appears to be doing very well in regard to his lower extremities in general all things considered. With that being said he is having some pain in the legs just due to the amount of swelling. He does have an area where he had a blister on the left lateral lower extremity this is open at this point other than that there's nothing else weeping at this time. 05/14/18 on evaluation today patient actually appears to be doing excellent all things considered in regard to his lower extremities. He still has a couple areas of weeping on each leg which has continued to be the issue for him. He does have an appointment with the lymphedema clinic although this isn't until February 2020. That was the earliest they had. In the meantime he has continued to tolerate the compression wraps without complication. 05/28/18 on evaluation today patient actually appears to be doing more poorly in regard to his left lower extremity where he has a wound open at this point. He also had a fall where he subsequently injured his right great toe which has led to an open wound at the site unfortunately. He has been tolerating the dressing changes without complication in general as far as the wraps are concerned that he has not been putting any dressing on the left 1st toe ulcer site. 06/11/18 on evaluation today patient appears to  be doing much worse in regard to his bilateral lower extremity ulcers. He has been tolerating the dressing changes without complication although his legs have not been wrapped more recently. Overall I am not very pleased with the way his legs appear. I do believe he needs to be back in compression wraps he still has not received  his compression wraps from the [facility] hospital as of yet. JONOVAN, BOEDECKER ([id number]) 06/18/18 on evaluation today patient actually appears to be doing significantly better than last time I saw him. He has been tolerating the compression wraps without complication in the circumferential ulcers especially appear to be doing much better. His toe ulcer on the right in regard to the great toe is better although not as good as the legs in my pinion. No fevers chills noted 07/02/18 on evaluation today patient appears to be doing much better in regard to his lower extremity ulcers. Unfortunately since I last saw him he's had the distal portion of his right great toe if he dated it sounds as if this actually went downhill very quickly. I had only seen him a few days prior and the toe did not appear to be infected at that point subsequently became infected very rapidly and it was decided by the surgeon that the distal portion of the toe needed to be removed. The patient seems to be doing well in this regard he tells me. With that being said his lower extremities are doing better from the standpoint of the wounds although he is significantly swollen at this point. 07/09/18 on evaluation today patient appears to be doing better in regard to the wounds on his lower extremities. In fact everything is almost completely healed he is just a small area on the left posterior lower extremity that is open at this point. 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 With that being said he needs some kind of compression it does sound like he may not be wearing his compression, that is the wraps, during the entire time between when he's here visit to visit. Apparently his wife took the current one off because it began to "fall apart". 07/16/18 on evaluation today patient appears to be extremely swollen especially in regard to his left lower extremity unfortunately. He also has a new skin tear over the left lower extremity and there's a smaller area on the right lower extremity as well. Unfortunately this seems to be due in part to blistering and fluid buildup in his leg. He did get the reduction wraps that were ordered by the [facility] hospital for him to go to lymphedema clinic. With that being said his wounds on the legs have not healed to the point to where they would likely accept them as a patient lymphedema clinic currently. We need to try to get this to heal. With that being said he's been taking his wraps off which is not doing him any favors at this point. In fact this is probably quite counterproductive compared to what needs to occur. We will likely need to increase to a four layer compression wrap and continue to also utilize elevation and he has to keep the wraps on not take them off as he's been doing currently hasn't had a wrap on since Saturday. 07/23/18 on evaluation today patient appears to be doing much better in regard to his bilateral lower extremities. In fact his left lower extremity which was the largest is actually 15 cm smaller today compared to what it was last time he was here in our clinic. This is obviously good news after just one week. Nonetheless the differences he actually kept the wraps on during the entire week  this time. That's not typical for him. I do believe he understands a little bit better now the severity of the situation and why it's important for him to keep these wraps  on. 07/30/18 on evaluation today patient actually appears to be doing rather well in regard to his lower extremities. His legs are much smaller than they have been in the past and he actually has only one very small rudder superficial region remaining that is not closed on the left lateral/posterior lower extremity even this is almost completely close. I do believe likely next week he will be healed without any complications. I do think we need to continue the wraps however this seems to be beneficial for him. I also think it may be a good time for [place] to go ahead and see about getting the appointment with the lymphedema clinic which is supposed to be made for him in order to keep this moving along and hopefully get them into compression wraps that will in the end help him to remain healed. 08/06/18 on evaluation today patient actually appears to be doing very well in regard as bilateral lower extremities. In fact his wounds appear to be completely healed at this time. 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 He is in the process of getting appointment with the lymphedema clinic we have made this referral were just waiting to hear back on the schedule time. We need to follow up on that today as well. 08/13/18 on evaluation today patient actually appears to be doing very well in regard to his bilateral lower extremities there are no open wounds at this point. We are gonna go ahead and see about ordering the Velcro compression wraps for him had a discussion with them about [place] doing it versus the [place] they feel like they can definitely afford going ahead and get the wraps themselves and they would prefer to try to avoid having to go to the lymphedema clinic if it all possible which I completely understand. As long as he has good compression I'm okay either way. 3/17//20 on evaluation today patient actually appears to be doing well in regard to his  bilateral lower extremity ulcers. He has been tolerating the dressing changes without complication specifically the compression wraps. Overall is had no issues my fingers finding that I see at this point is that he is having trouble with constipation. He tells me he has not been able to go to the bathroom for about six days. He's taken to over-the-counter oral laxatives unfortunately this is not helping. He has not contacted his doctor. MARIUS, BETTS ([id number]) 08/27/18 on evaluation today patient appears to be doing fairly well in regard to his lower extremities at this point. There does not appear to be any new altars is swelling is very well controlled. We are still waiting for his Velcro compression wraps to arrive that should be sometime in the next week is Artie sent the check for these. 09/03/18 on evaluation today patient appears to be doing excellent in regard to his bilateral lower extremities which he shows no signs of wound openings in regard to this point. He does have his Velcro compression wraps which did arrive in the mail since I saw him last week. Overall he is doing excellent in my pinion. 09/13/18 on evaluation today patient appears to be doing very well currently in regard to the overall appearance of his bilateral lower extremities although he's a little  bit more swollen than last time we saw him. At that point he been discharged without any open wounds. Nonetheless he has a small open wound on the posterior left lower extremity with some evidence of cellulitis noted as well. Fortunately I feel like he has made good progress overall with regard to his lower extremities from were things used to be. 09/20/18 on evaluation today patient actually appears to be doing much better. The erythematous lower extremity is improving wound itself which is still open appears to be doing much better as far as but appearance as well as pain is concerned overall very pleased in this regard.  There's no signs of active infection at this time. 09/27/18 on evaluation today patient's wounds on the lower extremity actually appear to be doing fairly well at this time which is good news. There is no evidence of active infection currently and again is just as left lower extremity were there any wounds at this point anyway. I believe they may be completely healed but again I'm not 100% sure based on evaluation today. I think one more week of observation would likely be a good idea. 10/04/18 on evaluation today patient actually appears to be doing excellent in regard to the left lower extremity which actually appears to be completely healed as of today. Unfortunately he's been having issues with his right lower extremity have a new wound that has opened. Fortunately there's no evidence of active infection at this time which is good news. 10/10/18 upon evaluation today patient actually appears to be doing a little bit worse with the new open area on his right posterior lower extremity. He's been tolerating the dressing changes without complication. Right now we been using his compression wraps although I think we may need to switch back to actually performing bilateral compression wraps are in the clinic. No fevers, chills, nausea, or vomiting noted at this time. 10/17/18 on evaluation today patient actually appears to be doing quite well in regard to his bilateral lower extremity ulcers. He has been tolerating the reps without complication although he would prefer not to rewrap his legs as of today. Fortunately there's no signs of active infection which is good news. No fevers, chills, nausea, or vomiting noted at this time. 10/24/18 on evaluation today patient appears to be doing very well in regard to his lower extremities. His right lower extremity is shown signs of healing and his left lower [place] though not healed appears to be improving which is excellent news. Overall very pleased with how  things seem to be progressing at this time. The patient is likewise happy to hear this. His Velcro compression wraps however have not been put on properly will gonna show his wife how to do that properly today. 10/31/18 on evaluation today patient appears to be doing more poorly in regard to his left lower extremity in particular although both lower extremities actually are showing some signs of being worse than my previous evaluation. Unfortunately I'm just not sure that his compression stockings even with the use of the compression/lymphedema pumps seem to be controlling this well. Upon further questioning he tells me that he also is not able to lie flat in the bed due to his congestive heart failure and difficulty breathing. For that reason he sleeps in his recliner. To make matters worse his recliner also cannot even hold his legs up so instead of even being somewhat elevated they pretty much hang down to the floor. This is the way he  sleeps each night which is definitely counterproductive to everything else that were attempting to do from the standpoint of controlling his fluid. Nonetheless I think that he potentially could benefit from a hospital bed although this would be something that his primary care provider would likely have to order since anything that is order on our side has to be directly related to wound care and again the hospital bed is not necessarily a direct relation to although I think it does contribute to his overall wound status on his lower extremities. 11/07/18 on evaluation today patient appears to be doing better in regard to his bilateral lower extremity. He's been tolerating the dressing changes without complication. We did in the interim since I last saw him switch to just using extras orbiting new alginate and that seems to have done much better for him. I'm very pleased with the overall progress that is made. 11/14/18 on evaluation today patient appears to be redoing  rather well in regard to his left lower extremity ulcers which are the only ones remaining at this point. Fortunately there's no signs of active infection at this time which is good news. Overall been Adam Merritt, Adam Merritt ([id number]) very pleased with how things seem to be progressing currently. No fevers, chills, nausea, or vomiting noted at this time. 11/20/17 on evaluation today patient actually appears to be doing quite well in regard to his lower extremities on the left on the right he has several blisters that showed up although there's some question about whether or not he has had his broker compression wraps on like he was supposed to or not. Fortunately there's no signs of active infection at this time which is good news. Unfortunately though he is doing well from the standpoint of the left leg the right leg is not doing as well again this is when we did not wrap last week. 11/28/18 on evaluation today patient appears to be doing well in regard to his bilateral lower extremities is left appears to be healed is right is not healed but is very close to being so. Overall very pleased with how things seem to be progressing. Patient is likewise happy that things are doing well. 12/09/18 on evaluation today patient actually appears to be completely healed which is excellent news. He actually seems to be doing well in regard to the swelling of the bilateral lower extremities which is also great news. Overall very pleased with how things seem to be progressing. Readmission: 03/18/2019 upon evaluation today patient presents for reevaluation concerning issues that he is having with his left lower extremity where he does have a wound noted upon inspection today. He also has a wound on the right second toe on the tip where it is apparently been rubbing in his shoe I did look at issues and you can actually see where this has been occurring as well. Fortunately there is no signs of infection with regard to the  toe necessarily although it is very swollen compared to normal that does have me somewhat concerned about the possibility of further evaluation for infection/osteomyelitis. I would recommend an x-ray to start with. Otherwise he states that it is been 1-2 weeks that he has had the draining in regard to left lower extremity he does not know how this happened he has been wearing his compression appropriately and I do see that as well today but nonetheless I do think that he needs to continue to be very cautious with regard to elevation as well.  03/25/2019 on evaluation today patient appears to be doing much better in regard to his lower extremity at this time. That is on the left. He did culture positive for Pseudomonas but the good news is he seems to be doing much better the gentamicin we applied topically seems to have done a great job for him. There is no signs of active infection at this time systemically and locally he is doing much better. No fevers, chills, nausea, vomiting, or diarrhea. In regard to his right toe this seems to be doing much better there is very little pressure noted at this point I did clean up some of the callus today around the edges of the wound as well as the surface of the wound but to be honest he is progressing quite nicely. 10/30; the area on the left anterior lower tibia area is healed over. His edema control is adequate even though his use of the compression pumps seems very intermittent. He has a juxta lite stocking on the right leg and a think there is 1 for the left leg at home. His wife is putting these on. He has an area on the plantar right second toe which is a hammertoe they are trying to offload this 04/08/2019 on evaluation today patient appears to be doing well in regard to his lower extremities which is good news. We are seeing him today for his toe ulcer which is giving him a little bit more trouble but again still seems to be doing better at this point  which is good news. He is going require some sharp debridement in regard to the toe today however. 04/15/2019 on evaluation today patient actually appears to be doing quite well with regard to his toe ulcer. I am very pleased with how things are doing in that regard. With regard to his lower extremity edema in general he tells me he is not been using the lymphedema pumps regularly although he does not use them. I think that he needs to use them more regularly he does have a small blister on the left lower extremity this is not open at this point but obviously it something that can get worse if he does not keep the compression going and the pumps going as well. 04/22/2019 on evaluation today patient appears to be doing well with regard to his toe ulcer. He has been tolerating the dressing changes without complication. This is measuring slightly smaller this week as compared to last week. Fortunately there is no signs of active infection at this time. 05/06/2019 on evaluation today patient actually appears to be doing excellent. In fact his toe appears to be completely healed which is great news. There is no signs of active infection at this time. Readmission: Patient presents today for follow-up after having been discharged at the beginning of December 2020. He states that in recent weeks things have reopened and has been having more trouble at this point. With that being said fortunately there is no signs of active infection at this time. No fever chills noted. Nonetheless he also does have an issue with one of his toes as well that Adam Merritt, Adam Merritt. ([id number]) seems to be somewhat this is on the right foot second toe. 07/25/2019 upon evaluation today patient's lower extremities appear to be doing excellent bilaterally. I really feel like he is showing signs of great improvement and in fact I think he is close to healing which is great news. There is no signs of active infection at  this time. No  fevers, chills, nausea, vomiting, or diarrhea. Objective Constitutional Well-nourished and well-hydrated in no acute distress. Vitals Time Taken: 11:22 AM, Height: 66 in, Weight: 292 lbs, BMI: 47.1, Temperature: 98.2 F, Pulse: 65 bpm, Respiratory Rate: 16 breaths/min, Blood Pressure: 154/59 mmHg. Respiratory normal breathing without difficulty. Psychiatric this patient is able to make decisions and demonstrates good insight into disease process. Alert and Oriented x 3. pleasant and cooperative. General Notes: Patient's wound bed showed signs of excellent epithelization at this point in regard to all wounds of the bilateral lower extremities he seems to be doing very well in this regard. Fortunately there is no signs of active infection currently. Integumentary (Hair, Skin) Wound #26 status is Open. Original cause of wound was Gradually Appeared. The wound is located on the Right,Lateral Lower Leg. The wound measures 7cm length x 4cm width x 0.1cm depth; 21.991cm^2 area and 2.199cm^3 volume. There is Fat Layer (Subcutaneous Tissue) Exposed exposed. There is a large amount of serous drainage noted. The wound margin is indistinct and nonvisible. There is medium (34-66%) pink granulation within the wound bed. There is a medium (34-66%) amount of necrotic tissue within the wound bed including Adherent Slough. Wound #27 status is Open. Original cause of wound was Gradually Appeared. The wound is located on the Left,Lateral Lower Leg. The wound measures 4.5cm length x 8.5cm width x 0.1cm depth; 30.041cm^2 area and 3.004cm^3 volume. There is Fat Layer (Subcutaneous Tissue) Exposed exposed. There is a large amount of serous drainage noted. The wound margin is flat and intact. There is medium (34-66%) pink granulation within the wound bed. There is a medium (34-66%) amount of necrotic tissue within the wound bed including Adherent Slough. Wound #28 status is Healed - Epithelialized. Original cause  of wound was Gradually Appeared. The wound is located on the Left,Anterior Lower Leg. The wound measures 0cm length x 0cm width x 0cm depth; 0cm^2 area and 0cm^3 volume. There is Fat Layer (Subcutaneous Tissue) Exposed exposed. There is a large amount of serous drainage noted. The wound margin is flat and intact. There is medium (34-66%) pink granulation within the wound bed. There is a medium (34-66%) amount of necrotic tissue within the wound bed including Adherent Slough. Wound #29 status is Healed - Epithelialized. Original cause of wound was Gradually Appeared. The wound is located on the [facility] Lower Leg. The wound measures 0cm length x 0cm width x 0cm depth; 0cm^2 area and 0cm^3 volume. There is Fat Layer (Subcutaneous Tissue) Exposed exposed. There is a medium amount of serous drainage noted. The wound margin is flat and intact. There is small (1-33%) pink granulation within the wound bed. There is a large (67-100%) amount of necrotic tissue within the wound bed including Adherent Slough. LESTER, CRICKENBERGER ([id number]) Wound #30 status is Healed - Epithelialized. Original cause of wound was Gradually Appeared. The wound is located on the Right Toe Second. The wound measures 0cm length x 0cm width x 0cm depth; 0cm^2 area and 0cm^3 volume. There is Fat Layer (Subcutaneous Tissue) Exposed exposed. There is a medium amount of serosanguineous drainage noted. The wound margin is flat and intact. There is large (67-100%) red granulation within the wound bed. There is no necrotic tissue within the wound bed. 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 There was a Four Layer Compression Therapy Procedure by Montey Hora, RN. Post procedure Diagnosis Wound #26: Same as Pre-Procedure Wound #27 Pre-procedure diagnosis of Wound #27 is a Lymphedema located on the Left,Lateral Lower Leg . There was a Four Layer Compression Therapy Procedure by Montey Hora, RN. Post procedure Diagnosis Wound #27: Same as Pre-Procedure Plan Wound Cleansing: Wound #26 Right,Lateral Lower Leg: Cleanse wound with mild soap and water - in office Wound #27 Left,Lateral Lower Leg: Cleanse wound with mild soap and water - in office Primary Wound Dressing: Wound #26 Right,Lateral Lower Leg: Silver Alginate Wound #27 Left,Lateral Lower Leg: Silver Alginate Secondary Dressing: BREVYN, RING ([id number]) Wound #26 Right,Lateral Lower Leg: ABD pad Wound #27 Left,Lateral Lower Leg: ABD pad Dressing Change Frequency: Wound #26 Right,Lateral Lower Leg: Change dressing every week Wound #27 Left,Lateral Lower Leg: Change dressing every week Follow-up Appointments: Wound #26 Right,Lateral Lower Leg: Return Appointment in 1 week. Wound #27 Left,Lateral Lower Leg: Return Appointment in 1 week. Edema Control: Wound #26 Right,Lateral Lower Leg: 4 Layer Compression System - Bilateral Wound #27 Left,Lateral Lower Leg: 4 Layer Compression System - Bilateral 1. I would recommend currently that we continue with the wound care measures with regard to the compression wraps that I do think have been beneficial for him again and he is in a 4-layer compression wrap bilaterally. 2. We will also continue with the silver alginate dressing though there is very little drainage at this point I think he may be even potentially healed by next week. 3. I am also going to suggest he continue to elevate his legs as much as possible as this will help  keep edema under good control. We will see patient back for reevaluation in 1 week here in the clinic. If anything worsens or changes patient will contact our office for additional recommendations. Electronic Signature(s) Signed: 07/28/2019 11:38:19 AM By: Worthy Keeler PA-C Entered By: Worthy Keeler on 07/28/2019 11:38:18 Adam Merritt ([id number]) -------------------------------------------------------------------------------- SuperBill Details Patient Name: Adam Merritt Date of Service: 07/25/2019 Medical Record Number: [id number] Patient Account Number: [id number] Date of Birth/Sex: Oct 16, 1945 (73 y.o. M) Treating RN: Montey Hora Primary Care Provider: [name] Other Clinician: Referring Provider: [name] Treating Provider/Extender: Melburn Hake, Jamorion Gomillion [name]: 1 Diagnosis Coding ICD-10 Codes Code Description I89.0 Lymphedema, not elsewhere classified L97.512 Non-pressure chronic ulcer of other part of right foot with fat layer exposed L97.822 Non-pressure chronic ulcer of other part of left lower leg with fat layer exposed E11.621 Type 2 diabetes mellitus with foot ulcer L97.511 Non-pressure chronic ulcer of other part of right foot limited to breakdown of skin E66.09 Other obesity due to excess calories Facility Procedures CPT4: Description Modifier Quantity Code [id number] BILATERAL: Application of multi-layer venous compression system; leg (below 1 knee), including ankle and foot. Physician Procedures CPT4 Code Description: [id number] - WC PHYS LEVEL 3 - EST PT ICD-10 Diagnosis Description I89.0 Lymphedema, not elsewhere classified L97.512 Non-pressure chronic ulcer of other part of right foot with fat L97.822 Non-pressure chronic ulcer of other  part of left lower leg with E11.621 Type 2 diabetes mellitus with foot ulcer Modifier: layer exposed fat layer exposed Quantity: 1 Electronic Signature(s) Signed: 07/28/2019 11:38:31 AM By: Worthy Keeler PA-C Previous  Signature: 07/25/2019 12:01:32 PM Version By: Montey Hora Previous Signature: 07/27/2019 11:36:00 PM Version By: Worthy Keeler PA-C Entered By: Worthy Keeler on 07/28/2019 11:38:31

## 2019-07-30 ENCOUNTER — Telehealth: Payer: Self-pay

## 2019-07-30 NOTE — Telephone Encounter (Signed)
PRIOR AUTHORIZATION FOR [facility] HAS BEEN APPROVED...VALID FROM 06/06/2019-06/04/2020.TAT

## 2019-07-31 ENCOUNTER — Other Ambulatory Visit: Payer: Self-pay

## 2019-07-31 ENCOUNTER — Encounter: Payer: Medicare PPO | Admitting: Physician Assistant

## 2019-07-31 DIAGNOSIS — I890 Lymphedema, not elsewhere classified: Secondary | ICD-10-CM | POA: Diagnosis not present

## 2019-07-31 DIAGNOSIS — L97812 Non-pressure chronic ulcer of other part of right lower leg with fat layer exposed: Secondary | ICD-10-CM | POA: Diagnosis not present

## 2019-07-31 DIAGNOSIS — L97512 Non-pressure chronic ulcer of other part of right foot with fat layer exposed: Secondary | ICD-10-CM | POA: Diagnosis not present

## 2019-07-31 DIAGNOSIS — E11621 Type 2 diabetes mellitus with foot ulcer: Secondary | ICD-10-CM | POA: Diagnosis not present

## 2019-07-31 DIAGNOSIS — L97822 Non-pressure chronic ulcer of other part of left lower leg with fat layer exposed: Secondary | ICD-10-CM | POA: Diagnosis not present

## 2019-07-31 DIAGNOSIS — J449 Chronic obstructive pulmonary disease, unspecified: Secondary | ICD-10-CM | POA: Diagnosis not present

## 2019-07-31 DIAGNOSIS — K5900 Constipation, unspecified: Secondary | ICD-10-CM | POA: Diagnosis not present

## 2019-07-31 DIAGNOSIS — I872 Venous insufficiency (chronic) (peripheral): Secondary | ICD-10-CM | POA: Diagnosis not present

## 2019-07-31 DIAGNOSIS — E11622 Type 2 diabetes mellitus with other skin ulcer: Secondary | ICD-10-CM | POA: Diagnosis not present

## 2019-07-31 DIAGNOSIS — S99921A Unspecified injury of right foot, initial encounter: Secondary | ICD-10-CM | POA: Diagnosis not present

## 2019-07-31 NOTE — Progress Notes (Signed)
Adam Merritt, Adam Merritt ([id number]) Visit Report for 07/31/2019 Arrival Information Details Patient Name: Adam Merritt, Adam Merritt Date of Service: 07/31/2019 11:00 AM Medical Record Number: [id number] Patient Account Number: [id number] Date of Birth/Sex: 1946/04/26 (74 y.o. M) Treating RN: Army Melia Primary Care Asir Bingley: [name] Other Clinician: Referring Ariona Deschene: [name] Treating Davidlee Jeanbaptiste/Extender: Melburn Hake, [name]: 2 Visit Information History Since Last Visit Added or deleted any medications: No Patient Arrived: Wheel Chair Any new allergies or adverse reactions: No Arrival Time: 11:10 Had a fall or experienced change in No Accompanied By: wife activities of daily living that may affect Transfer Assistance: None risk of falls: Patient Identification Verified: Yes Signs or symptoms of abuse/neglect since last visito No Hospitalized since last visit: No Has Dressing in Place as Prescribed: Yes Pain Present Now: No Electronic Signature(s) Signed: 07/31/2019 11:52:49 AM By: Army Melia Entered By: Army Melia on 07/31/2019 11:11:06 Adam Merritt ([id number]) -------------------------------------------------------------------------------- Compression Therapy Details Patient Name: Adam Merritt Date of Service: 07/31/2019 11:00 AM Medical Record Number: [id number] Patient Account Number: [id number] Date of Birth/Sex: 31-Jul-1945 (74 y.o. M) Treating RN: Army Melia Primary Care Taronda Comacho: [name] Other Clinician: Referring Gresia Isidoro: [name] Treating [name] [name]/Extender: Melburn Hake, [name]: 2 Compression Therapy Performed for Wound Assessment: Wound #26 Right,Lateral Lower Leg Performed By: Clinician Army Melia, RN Compression Type: Four Layer Post Procedure Diagnosis Same as Pre-procedure Electronic Signature(s) Signed: 07/31/2019 11:52:49 AM By: Army Melia Entered By: Army Melia on 07/31/2019 11:39:44 Adam Merritt  ([id number]) -------------------------------------------------------------------------------- Compression Therapy Details Patient Name: Adam Merritt Date of Service: 07/31/2019 11:00 AM Medical Record Number: [id number] Patient Account Number: [id number] Date of Birth/Sex: [birth year] (74 y.o. M) Treating RN: Army Melia Primary Care Malajah Oceguera: [name] Other Clinician: Referring Azlyn Wingler: [name] Treating Diantha Paxson/Extender: Melburn Hake, [name]: 2 Compression Therapy Performed for Wound Assessment: Wound #27 Left,Lateral Lower Leg Performed By: Clinician Army Melia, RN Compression Type: Four Layer Post Procedure Diagnosis Same as Pre-procedure Electronic Signature(s) Signed: 07/31/2019 11:52:49 AM By: Army Melia Entered By: Army Melia on 07/31/2019 11:39:45 Adam Merritt ([id number]) -------------------------------------------------------------------------------- Encounter Discharge Information Details Patient Name: Adam Merritt Date of Service: 07/31/2019 11:00 AM Medical Record Number: [id number] Patient Account Number: [id number] Date of Birth/Sex: 09-23-1945 (74 y.o. M) Treating RN: Army Melia Primary Care Ledger Heindl: [name] Other Clinician: Referring Lakita Sahlin: [name] Treating Maquita Sandoval/Extender: Melburn Hake, [name]: 2 Encounter Discharge Information Items Discharge Condition: Stable Ambulatory Status: Ambulatory Discharge Destination: Home Transportation: Private Auto Accompanied By: spouse Schedule Follow-up Appointment: Yes Clinical Summary of Care: Electronic Signature(s) Signed: 07/31/2019 11:52:49 AM By: Army Melia Entered By: Army Melia on 07/31/2019 11:41:53 Adam Merritt ([id number]) -------------------------------------------------------------------------------- Lower Extremity Assessment Details Patient Name: Adam Merritt Date of Service: 07/31/2019 11:00 AM Medical Record Number: [id number] Patient  Account Number: [id number] Date of Birth/Sex: 1946-04-21 (74 y.o. M) Treating RN: Army Melia Primary Care Jalonda Antigua: [name] Other Clinician: Referring Rai Severns: [name] Treating Enid Maultsby/Extender: Melburn Hake, [name]: 2 Edema Assessment Assessed: [Left: No] [Right: No] Edema: [Left: Yes] [Right: Yes] Calf Left: Right: Point of Measurement: 34 cm From Medial Instep 55 cm 51 cm Ankle Left: Right: Point of Measurement: 12 cm From Medial Instep 39 cm 29 cm Vascular Assessment Pulses: Dorsalis Pedis Palpable: [Left:Yes] [Right:Yes] Electronic Signature(s) Signed: 07/31/2019 11:52:49 AM By: Army Melia Entered By: Army Melia on 07/31/2019 11:20:52 Adam Merritt ([id number]) -------------------------------------------------------------------------------- Multi Wound Chart Details Patient Name: Adam Loh  W. Date of Service: 07/31/2019 11:00 AM Medical Record Number: [id number] Patient Account Number: [id number] Date of Birth/Sex: 12/04/1945 (74 y.o. M) Treating RN: Army Melia Primary Care Leonia Heatherly: [name] Other Clinician: Referring Katsumi Wisler: [name] Treating Karolina Zamor/Extender: Melburn Hake, [name]: 2 Vital Signs Height(in): 56 Pulse(bpm): 60 Weight(lbs): 40 Blood Pressure(mmHg): 146/71 Body Mass Index(BMI): 47 Temperature(F): 98.2 Respiratory Rate(breaths/min): 16 Photos: [N/A:N/A] Wound Location: Right Lower Leg - Lateral Left Lower Leg - Lateral N/A Wounding Event: Gradually Appeared Gradually Appeared N/A Primary Etiology: Lymphedema Lymphedema N/A Comorbid History: Anemia, Lymphedema, Chronic Anemia, Lymphedema, Chronic N/A Obstructive Pulmonary Disease Obstructive Pulmonary Disease (COPD), Sleep Apnea, Congestive (COPD), Sleep Apnea, Congestive Heart Failure, Coronary Artery Heart Failure, Coronary Artery Disease, Hypertension, Peripheral Disease, Hypertension, Peripheral Venous Disease, Type II Diabetes, Venous  Disease, Type II Diabetes, Osteoarthritis, Neuropathy, Received Osteoarthritis, Neuropathy, Received Chemotherapy Chemotherapy Date Acquired: 07/07/2019 07/07/2019 N/A Weeks of Treatment: 2 2 N/A Wound Status: Open Open N/A Measurements L x W x D (cm) 0.5x0.5x0.1 3.5x7x0.1 N/A Area (cm) : 0.196 19.242 N/A Volume (cm) : 0.02 1.924 N/A % Reduction in Area: 99.60% -366.70% N/A % Reduction in Volume: 99.60% -367.00% N/A Classification: Full Thickness Without Exposed Full Thickness Without Exposed N/A Support Structures Support Structures Exudate Amount: Large Large N/A Exudate Type: Serous Serous N/A Exudate Color: amber amber N/A Wound Margin: Indistinct, nonvisible Flat and Intact N/A Granulation Amount: Medium (34-66%) Medium (34-66%) N/A Granulation Quality: Pink Pink N/A Necrotic Amount: Medium (34-66%) Medium (34-66%) N/A Exposed Structures: Fat Layer (Subcutaneous Tissue) Fat Layer (Subcutaneous Tissue) N/A Exposed: Yes Exposed: Yes Fascia: No Fascia: No Tendon: No Tendon: No Muscle: No Muscle: No Joint: No Joint: No Bone: No Bone: No Epithelialization: Small (1-33%) Small (1-33%) N/A Treatment Notes WALLIE, LAGRAND ([id number]) Electronic Signature(s) Signed: 07/31/2019 11:52:49 AM By: Army Melia Entered By: Army Melia on 07/31/2019 11:38:54 Adam Merritt ([id number]) -------------------------------------------------------------------------------- [place] Details Patient Name: Adam Merritt Date of Service: 07/31/2019 11:00 AM Medical Record Number: [id number] Patient Account Number: [id number] Date of Birth/Sex: 1945-10-14 (73 y.o. M) Treating RN: Army Melia Primary Care Mamoudou Mulvehill: [name] Other Clinician: Referring Jonnelle Lawniczak: [name] Treating Brandalyn Harting/Extender: Melburn Hake, [name]: 2 Active Inactive Abuse / Safety / Falls / Self Care Management Nursing Diagnoses: History of Falls Potential for  falls Goals: Patient/caregiver will demonstrate safe use of adaptive devices to increase mobility Date Initiated: 07/17/2019 Target Resolution Date: 08/15/2019 Goal Status: Active Interventions: Provide education on fall prevention Notes: Orientation to the Wound Care Program Nursing Diagnoses: Knowledge deficit related to the wound healing center program Goals: Patient/caregiver will verbalize understanding of the [facility] Program Date Initiated: 07/17/2019 Target Resolution Date: 08/15/2019 Goal Status: Active Interventions: Provide education on orientation to the wound center Notes: Wound/Skin Impairment Nursing Diagnoses: Impaired tissue integrity Goals: Ulcer/skin breakdown will have a volume reduction of 30% by week 4 Date Initiated: 07/17/2019 Target Resolution Date: 08/15/2019 Goal Status: Active Interventions: Assess ulceration(s) every visit Notes: Electronic Signature(s) Signed: 07/31/2019 11:52:49 AM By: Army Melia Entered By: Army Melia on 07/31/2019 11:38:45 Adam Merritt ([id number]) Adam Merritt, Adam Merritt ([id number]) -------------------------------------------------------------------------------- Pain Assessment Details Patient Name: Adam Merritt Date of Service: 07/31/2019 11:00 AM Medical Record Number: [id number] Patient Account Number: [id number] Date of Birth/Sex: 05-06-1946 (73 y.o. M) Treating RN: Army Melia Primary Care Simi Briel: [name] Other Clinician: Referring Wileen Duncanson: [name] Treating Triton Heidrich/Extender: Melburn Hake, [name]: 2 Active Problems Location of Pain Severity and Description of Pain  Patient Has Paino No Site Locations Pain Management and Medication Current Pain Management: Electronic Signature(s) Signed: 07/31/2019 11:52:49 AM By: Army Melia Entered By: Army Melia on 07/31/2019 11:12:14 Adam Merritt  ([id number]) -------------------------------------------------------------------------------- Patient/Caregiver Education Details Patient Name: Adam Merritt Date of Service: 07/31/2019 11:00 AM Medical Record Number: [id number] Patient Account Number: [id number] Date of Birth/Gender: November 13, 1945 (73 y.o. M) Treating RN: Army Melia Primary Care Physician: [name] Other Clinician: Referring Physician: [name] Treating Physician/Extender: Sharalyn Ink in Treatment: 2 Education Assessment Education Provided To: Patient Education Topics Provided Wound/Skin Impairment: Handouts: Caring for Your Ulcer Methods: Demonstration, Explain/Verbal Responses: State content correctly Electronic Signature(s) Signed: 07/31/2019 11:52:49 AM By: Army Melia Entered By: Army Melia on 07/31/2019 11:41:17 Adam Merritt ([id number]) -------------------------------------------------------------------------------- Wound Assessment Details Patient Name: Adam Merritt Date of Service: 07/31/2019 11:00 AM Medical Record Number: [id number] Patient Account Number: [id number] Date of Birth/Sex: 1945/09/23 (73 y.o. M) Treating RN: Army Melia Primary Care Gwen Edler: [name] Other Clinician: Referring Berline Semrad: [name] Treating Yatziry Deakins/Extender: Melburn Hake, [name]: 2 Wound Status Wound Number: 26 Primary Lymphedema Etiology: Wound Location: Right Lower Leg - Lateral Wound Open Wounding Event: Gradually Appeared Status: Date Acquired: 07/07/2019 Comorbid Anemia, Lymphedema, Chronic Obstructive Pulmonary Weeks Of Treatment: 2 History: Disease (COPD), Sleep Apnea, Congestive Heart Failure, Clustered Wound: No Coronary Artery Disease, Hypertension, Peripheral Venous Disease, Type II Diabetes, Osteoarthritis, Neuropathy, Received Chemotherapy Photos Wound Measurements Length: (cm) 0.5 Width: (cm) 0.5 Depth: (cm) 0.1 Area: (cm) 0.196 Volume: (cm) 0.02 %  Reduction in Area: 99.6% % Reduction in Volume: 99.6% Epithelialization: Small (1-33%) Wound Description Classification: Full Thickness Without Exposed Support Stru Wound Margin: Indistinct, nonvisible Exudate Amount: Large Exudate Type: Serous Exudate Color: amber ctures Foul Odor After Cleansing: No Slough/Fibrino Yes Wound Bed Granulation Amount: Medium (34-66%) Exposed Structure Granulation Quality: Pink Fascia Exposed: No Necrotic Amount: Medium (34-66%) Fat Layer (Subcutaneous Tissue) Exposed: Yes Necrotic Quality: Adherent Slough Tendon Exposed: No Muscle Exposed: No Joint Exposed: No Bone Exposed: No Treatment Notes Wound #26 (Right, Lateral Lower Leg) Notes silver cell to left leg, ABD 4 layer bilateral AL, [place] ([id number]) Electronic Signature(s) Signed: 07/31/2019 11:52:49 AM By: Army Melia Entered By: Army Melia on 07/31/2019 11:20:08 Adam Merritt ([id number]) -------------------------------------------------------------------------------- Wound Assessment Details Patient Name: Adam Merritt Date of Service: 07/31/2019 11:00 AM Medical Record Number: [id number] Patient Account Number: [id number] Date of Birth/Sex: Apr 12, 1946 (73 y.o. M) Treating RN: Army Melia Primary Care Haydn Hutsell: [name] Other Clinician: Referring Adilynne Fitzwater: [name] Treating Oliviarose Punch/Extender: Melburn Hake, [name]: 2 Wound Status Wound Number: 27 Primary Lymphedema Etiology: Wound Location: Left Lower Leg - Lateral Wound Open Wounding Event: Gradually Appeared Status: Date Acquired: 07/07/2019 Comorbid Anemia, Lymphedema, Chronic Obstructive Pulmonary Weeks Of Treatment: 2 History: Disease (COPD), Sleep Apnea, Congestive Heart Failure, Clustered Wound: No Coronary Artery Disease, Hypertension, Peripheral Venous Disease, Type II Diabetes, Osteoarthritis, Neuropathy, Received Chemotherapy Photos Wound Measurements Length: (cm) 3.5 Width: (cm)  7 Depth: (cm) 0.1 Area: (cm) 19.242 Volume: (cm) 1.924 % Reduction in Area: -366.7% % Reduction in Volume: -367% Epithelialization: Small (1-33%) Wound Description Classification: Full Thickness Without Exposed Support Stru Wound Margin: Flat and Intact Exudate Amount: Large Exudate Type: Serous Exudate Color: amber ctures Foul Odor After Cleansing: No Slough/Fibrino Yes Wound Bed Granulation Amount: Medium (34-66%) Exposed Structure Granulation Quality: Pink Fascia Exposed: No Necrotic Amount: Medium (34-66%) Fat Layer (Subcutaneous Tissue) Exposed: Yes Necrotic Quality: Adherent Slough Tendon Exposed: No Muscle Exposed: No  Joint Exposed: No Bone Exposed: No Treatment Notes Wound #27 (Left, Lateral Lower Leg) Notes silver cell to left leg, ABD 4 layer bilateral KONOR, NOREN ([id number]) Electronic Signature(s) Signed: 07/31/2019 11:52:49 AM By: Army Melia Entered By: Army Melia on 07/31/2019 11:20:28 Adam Merritt ([id number]) -------------------------------------------------------------------------------- Vitals Details Patient Name: Adam Merritt Date of Service: 07/31/2019 11:00 AM Medical Record Number: [id number] Patient Account Number: [id number] Date of Birth/Sex: 1946/05/14 (73 y.o. M) Treating RN: Army Melia Primary Care Mamoudou Mulvehill: [name] Other Clinician: Referring Sarahmarie Leavey: [name] Treating Kohan Azizi/Extender: Melburn Hake, [name]: 2 Vital Signs Time Taken: 11:11 Temperature (F): 98.2 Height (in): 66 Pulse (bpm): 60 Weight (lbs): 292 Respiratory Rate (breaths/min): 16 Body Mass Index (BMI): 47.1 Blood Pressure (mmHg): 146/71 Reference Range: 80 - 120 mg / dl Electronic Signature(s) Signed: 07/31/2019 11:52:49 AM By: Army Melia Entered By: Army Melia on 07/31/2019 11:12:09

## 2019-07-31 NOTE — Progress Notes (Addendum)
SEITH, AIKEY ([id number]) Visit Report for 07/31/2019 Chief Complaint Document Details Patient Name: Adam Merritt, Adam Merritt Date of Service: 07/31/2019 11:00 AM Medical Record Number: [id number] Patient Account Number: [id number] Date of Birth/Sex: 09-19-1945 (73 y.o. M) Treating RN: Army Melia Primary Care Provider: [name] Other Clinician: Referring Provider: [name] Treating Provider/Extender: Melburn Hake, Brynja Marker [name]: 2 Information Obtained from: Patient Chief Complaint Bilateral LE Ulcers and right second toe ulcer Electronic Signature(s) Signed: 07/31/2019 11:37:12 AM By: Worthy Keeler PA-C Entered By: Worthy Keeler on 07/31/2019 11:37:11 Adam Merritt ([id number]) -------------------------------------------------------------------------------- HPI Details Patient Name: Adam Merritt Date of Service: 07/31/2019 11:00 AM Medical Record Number: [id number] Patient Account Number: [id number] Date of Birth/Sex: 08-08-1945 (73 y.o. M) Treating RN: Army Melia Primary Care Provider: [name] Other Clinician: Referring Provider: [name] Treating Provider/Extender: Melburn Hake, Suresh Audi [name]: 2 History of Present Illness HPI Description: 10/18/17-He is here for initial evaluation of bilateral lower extremity ulcerations in the presence of venous insufficiency and lymphedema. He has been seen by vascular medicine in the past, Dr. Lucky Cowboy, last seen in 2016. He does have a history of abnormal ABIs, which is to be expected given his lymphedema and venous insufficiency. According to Epic, it appears that all attempts for arterial evaluation and/or angiography were not follow through with by patient. He does have a history of being seen in lymphedema clinic in 2018, stopped going approximately 6 months ago stating "it didn't do any good". He does not have lymphedema pumps, he does not have custom fit compression wrap/stockings. He is diabetic and his recent A1c  last month was 7.6. He admits to chronic bilateral lower extremity pain, no change in pain since blister and ulceration development. He is currently being treated with Levaquin for bronchitis. He has home health and we will continue. 10/25/17-He is here in follow-up evaluation for bilateral lower extremity ulcerationssubtle he remains on Levaquin for bronchitis. Right lower extremity with no evidence of drainage or ulceration, persistent left lower extremity ulceration. He states that home health has not been out since his appointment. He went to [facility] on Tuesday, studies revealed: RIGHT ABI 0.9, TBI 0.6 LEFT ABI 1.1, TBI 0.6 with triphasic flow bilaterally. We will continue his same treatment plan. He has been educated on compression therapy and need for elevation. He will benefit from lymphedema pumps 11/01/17-He is here in follow-up evaluation for left lower extremity ulcer. The right lower extremity remains healed. He has home health services, but they have not been out to see the patient for 2-3 weeks. He states it home health physical therapy changed his dressing yesterday after therapy; he placed Ace wrap compression. We are still waiting for lymphedema pumps, reordered d/t need for company change. 11/08/17-He is here in follow-up evaluation for left lower extremity ulcer. It is improved. Edema is significantly improved with compression therapy. We will continue with same treatment plan and he will follow-up next week. No word regarding lymphedema pumps 11/15/17-He is here in follow-up evaluation for left lower extremity ulcer. He is healed and will be discharged from wound care services. I have reached out to [facility] regarding his lymphedema pumps. They have been unable to reach the patient; the contact number they had with the patient's wife's cell phone and she has not answered any unrecognized calls. Contact should be made today, trial planned for next week; [facility] will continue to follow 11/27/17 on evaluation today patient has multiple blistered  areas over the right lower extremity his left lower extremity appears to be doing okay. These blistered areas show signs of no infection which is great news. With that being said he did have some necrotic skin overlying which was mechanically debrided away with saline and gauze today without complication. Overall post debridement the wounds appear to be doing better but in general his swelling seems to be increased. This is obviously not good news. I think this is what has given rise to the blisters. 12/04/17 on evaluation today patient presents for follow-up concerning his bilateral lower extremity edema in the right lower extremity ulcers. He has been tolerating the dressing changes without complication. With that being said he has had no real issues with the wraps which is also good news. Overall I'm pleased with the progress he's been making. 12/11/17 on evaluation today patient appears to be doing rather well in regard to his right lateral lower extremity ulcer. He's been tolerating the dressing changes without complication. Fortunately there does not appear to be any evidence of infection at this time. Overall I'm pleased with the progress that is being made. Unfortunately he has been in the hospital due to having what sounds to be a stomach virus/flu fortunately that is starting to get better. 12/18/17 on evaluation today patient actually appears to be doing very well in regard to his bilateral lower extremities the swelling is under fairly good control his lymphedema pumps are still not up and running quite yet. With that being said he does have several areas of opening noted as far as wounds are concerned mainly over the left lower extremity. With that being said I do believe once he gets lymphedema pumps this would least help you mention some the fluid and preventing this from occurring. Hopefully that  will be set up soon sleeves are Artie in place at his home he just waiting for the machine. 12/25/17 on evaluation today patient actually appears to be doing excellent in fact all of his ulcers appear to have resolved his legs appear very well. I do think he needs compression stockings we have discussed this and they are actually going to go to [place] today to elastic therapy to get this fitted for him. I think that is definitely a good thing to do. Readmission: 04/09/18 upon evaluation today this patient is seen for readmission due to bilateral lower extremity lymphedema. He has significant swelling of his extremities especially on the left although the right is also swollen he has weeping from both sides. There are no obvious open wounds at this point. Fortunately he has been doing fairly well for quite a bit of time since I last saw him. Nonetheless unfortunately this seems to have reopened and is giving quite a bit of trouble. He states this began about a week ago when he first called [place] to get in to be seen. No fevers, chills, nausea, or vomiting noted at this time. He has not been using his lymphedema pumps due to the fact that they won't fit on his leg at this point likewise is also not been using his compression for essentially the same reason. 04/16/18 upon evaluation today patient actually appears to be doing a little better in regard to the fluid in his bilateral lower extremities. With that being said he's had three falls since I saw him last week. He also states that he's been feeling very poorly. I was concerned last week and feel like that the concern is still there as  far as the congestion in his chest is concerned he seems to be breathing about the same as last week but again he states he's very weak he's not even able to walk further than from the chair to the door. His wife had to buy a wheelchair just to be able to get them out of the MACLIN, GUERRETTE ([id number]) house to get to  the appointment today. This has me very concerned. 04/23/18 on evaluation today patient actually appears to be doing much better than last week's evaluation. At that time actually had to transport him to the ER via EMS and he subsequently was admitted for acute pulmonary edema, acute renal failure, and acute congestive heart failure. Fortunately he is doing much better. Apparently they did dialyze him and were able to take off roughly 35 pounds of fluid. Nonetheless he is feeling much better both in regard to his breathing and he's able to get around much better at this time compared to previous. Overall I'm very happy with how things are at this time. There does not appear to be any evidence of infection currently. No fevers, chills, nausea, or vomiting noted at this time. 04/30/2018 patient seen today for follow-up and management of bilateral lower extremity lymphedema. He did express being more sad today than usual due to the recent loss of his dog. He states that he has been compliant with using the lymphedema pumps. However he does admit a minute over the last 2-3 days he has not been using the pumps due to the recent loss of his dog. At this time there is no drainage or open wounds to his lower extremities. The left leg edema is measuring smaller today. Still has a significant amount of edema on bilateral lower extremities With dry flaky skin. He will be referred to the lymphedema clinic for further management. Will continue 3 layer compression wraps and follow-up in 1-2 weeks.Denies any pain, fever, chairs recently. No recent falls or injuries reported during this visit. 05/07/18 on evaluation today patient actually appears to be doing very well in regard to his lower extremities in general all things considered. With that being said he is having some pain in the legs just due to the amount of swelling. He does have an area where he had a blister on the left lateral lower extremity this is open  at this point other than that there's nothing else weeping at this time. 05/14/18 on evaluation today patient actually appears to be doing excellent all things considered in regard to his lower extremities. He still has a couple areas of weeping on each leg which has continued to be the issue for him. He does have an appointment with the lymphedema clinic although this isn't until February 2020. That was the earliest they had. In the meantime he has continued to tolerate the compression wraps without complication. 05/28/18 on evaluation today patient actually appears to be doing more poorly in regard to his left lower extremity where he has a wound open at this point. He also had a fall where he subsequently injured his right great toe which has led to an open wound at the site unfortunately. He has been tolerating the dressing changes without complication in general as far as the wraps are concerned that he has not been putting any dressing on the left 1st toe ulcer site. 06/11/18 on evaluation today patient appears to be doing much worse in regard to his bilateral lower extremity ulcers. He has been tolerating the  dressing changes without complication although his legs have not been wrapped more recently. Overall I am not very pleased with the way his legs appear. I do believe he needs to be back in compression wraps he still has not received his compression wraps from the [facility] hospital as of yet. 06/18/18 on evaluation today patient actually appears to be doing significantly better than last time I saw him. He has been tolerating the compression wraps without complication in the circumferential ulcers especially appear to be doing much better. His toe ulcer on the right in regard to the great toe is better although not as good as the legs in my pinion. No fevers chills noted 07/02/18 on evaluation today patient appears to be doing much better in regard to his lower extremity ulcers. Unfortunately since I  last saw him he's had the distal portion of his right great toe if he dated it sounds as if this actually went downhill very quickly. I had only seen him a few days prior and the toe did not appear to be infected at that point subsequently became infected very rapidly and it was decided by the surgeon that the distal portion of the toe needed to be removed. The patient seems to be doing well in this regard he tells me. With that being said his lower extremities are doing better from the standpoint of the wounds although he is significantly swollen at this point. 07/09/18 on evaluation today patient appears to be doing better in regard to the wounds on his lower extremities. In fact everything is almost completely healed he is just a small area on the left posterior lower extremity that is open at this point. 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 With that being said he needs some kind of compression it does sound like he may not be wearing his compression, that is the wraps, during the entire time between when he's here visit to visit. Apparently his wife took the current one off because it began to "fall apart". 07/16/18 on evaluation today patient appears to be extremely swollen especially in regard to his left lower extremity unfortunately. He also has a new skin tear over the left lower extremity and there's a smaller area on the right lower extremity as well. Unfortunately this seems to be due in part to blistering and fluid buildup in his leg. He did get the reduction wraps that were ordered by the [facility] hospital for him to go to lymphedema clinic. With that being said his wounds on the legs have not healed to the point to where they would likely accept them as a patient lymphedema clinic currently. We need to try  to get this to heal. With that being said he's been taking his wraps off which is not doing him any favors at this point. In fact this is probably quite counterproductive compared to what needs to occur. We will likely need to increase to a four layer compression wrap and continue to also utilize elevation and he has to keep the wraps on not take them off as he's been doing currently hasn't had a wrap on since Saturday. 07/23/18 on evaluation today patient appears to be doing much better in regard to his bilateral lower extremities. In fact his left lower extremity which was the largest is actually 15  cm smaller today compared to what it was last time he was here in our clinic. This is obviously good news after just one week. Nonetheless the differences he actually kept the wraps on during the entire week this time. That's not typical for him. I do believe he understands a little bit better now the severity of the situation and why it's important for him to keep these wraps on. 07/30/18 on evaluation today patient actually appears to be doing rather well in regard to his lower extremities. His legs are much smaller than they have been in the past and he actually has only one very small rudder superficial region remaining that is not closed on the left lateral/posterior lower extremity even this is almost completely close. I do believe likely next week he will be healed without any complications. I do think we need to continue the wraps however this seems to be beneficial for him. I also think it may be a good time for [place] to go ahead and see about getting the appointment with the lymphedema clinic which is supposed to be made for him in order to keep this moving along and hopefully get them into compression wraps that will in the end help him to remain healed. 08/06/18 on evaluation today patient actually appears to be doing very well in regard as bilateral lower extremities. In fact his wounds appear to  be completely healed at this time. 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 He is in the process of getting appointment with the lymphedema clinic we have made this referral were just waiting to hear back on the schedule time. We need to follow up on that today as well. ABDIKADIR, FOHL ([id number]) 08/13/18 on evaluation today patient actually appears to be doing very well in regard to his bilateral lower extremities there are no open wounds at this point. We are gonna go ahead and see about ordering the Velcro compression wraps for him had a discussion with them about [place] doing it versus the [place] they feel like they can definitely afford going ahead and get the wraps themselves and they would prefer to try to avoid having to go to the lymphedema clinic if it all possible which I completely understand. As long as he has good compression I'm okay either way. 3/17//20 on evaluation today patient actually appears to be doing well in regard to his bilateral lower extremity ulcers. He has been tolerating the dressing changes without complication specifically the compression wraps. Overall is had no issues my fingers finding that I see at this point is that he is having trouble with constipation. He tells me he has not been able to go to the bathroom for about six days. He's taken to over-the-counter oral laxatives unfortunately this is not helping. He has not contacted his doctor. 08/27/18 on evaluation today patient appears to be doing fairly well in regard to his lower extremities at this point. There does not appear to be any new altars is swelling is very well controlled. We are still waiting for his Velcro compression wraps to arrive that should be sometime in the next week is Artie sent the check for these. 09/03/18 on evaluation today patient appears to be doing excellent in regard to his bilateral lower extremities which he shows no signs of  wound openings in regard to this point. He does have his Velcro compression wraps which did arrive in the mail since I saw  him last week. Overall he is doing excellent in my pinion. 09/13/18 on evaluation today patient appears to be doing very well currently in regard to the overall appearance of his bilateral lower extremities although he's a little bit more swollen than last time we saw him. At that point he been discharged without any open wounds. Nonetheless he has a small open wound on the posterior left lower extremity with some evidence of cellulitis noted as well. Fortunately I feel like he has made good progress overall with regard to his lower extremities from were things used to be. 09/20/18 on evaluation today patient actually appears to be doing much better. The erythematous lower extremity is improving wound itself which is still open appears to be doing much better as far as but appearance as well as pain is concerned overall very pleased in this regard. There's no signs of active infection at this time. 09/27/18 on evaluation today patient's wounds on the lower extremity actually appear to be doing fairly well at this time which is good news. There is no evidence of active infection currently and again is just as left lower extremity were there any wounds at this point anyway. I believe they may be completely healed but again I'm not 100% sure based on evaluation today. I think one more week of observation would likely be a good idea. 10/04/18 on evaluation today patient actually appears to be doing excellent in regard to the left lower extremity which actually appears to be completely healed as of today. Unfortunately he's been having issues with his right lower extremity have a new wound that has opened. Fortunately there's no evidence of active infection at this time which is good news. 10/10/18 upon evaluation today patient actually appears to be doing a little bit worse with the new  open area on his right posterior lower extremity. He's been tolerating the dressing changes without complication. Right now we been using his compression wraps although I think we may need to switch back to actually performing bilateral compression wraps are in the clinic. No fevers, chills, nausea, or vomiting noted at this time. 10/17/18 on evaluation today patient actually appears to be doing quite well in regard to his bilateral lower extremity ulcers. He has been tolerating the reps without complication although he would prefer not to rewrap his legs as of today. Fortunately there's no signs of active infection which is good news. No fevers, chills, nausea, or vomiting noted at this time. 10/24/18 on evaluation today patient appears to be doing very well in regard to his lower extremities. His right lower extremity is shown signs of healing and his left lower Trinity though not healed appears to be improving which is excellent news. Overall very pleased with how things seem to be progressing at this time. The patient is likewise happy to hear this. His Velcro compression wraps however have not been put on properly will gonna show his wife how to do that properly today. 10/31/18 on evaluation today patient appears to be doing more poorly in regard to his left lower extremity in particular although both lower extremities actually are showing some signs of being worse than my previous evaluation. Unfortunately I'm just not sure that his compression stockings even with the use of the compression/lymphedema pumps seem to be controlling this well. Upon further questioning he tells me that he also is not able to lie flat in the bed due to his congestive heart failure and difficulty breathing. For that reason he  sleeps in his recliner. To make matters worse his recliner also cannot even hold his legs up so instead of even being somewhat elevated they pretty much hang down to the floor. This is the way he  sleeps each night which is definitely counterproductive to everything else that were attempting to do from the standpoint of controlling his fluid. Nonetheless I think that he potentially could benefit from a hospital bed although this would be something that his primary care provider would likely have to order since anything that is order on our side has to be directly related to wound care and again the hospital bed is not necessarily a direct relation to although I think it does contribute to his overall wound status on his lower extremities. 11/07/18 on evaluation today patient appears to be doing better in regard to his bilateral lower extremity. He's been tolerating the dressing changes without complication. We did in the interim since I last saw him switch to just using extras orbiting new alginate and that seems to have done much better for him. I'm very pleased with the overall progress that is made. 11/14/18 on evaluation today patient appears to be redoing rather well in regard to his left lower extremity ulcers which are the only ones remaining at this point. Fortunately there's no signs of active infection at this time which is good news. Overall been very pleased with how things seem to be progressing currently. No fevers, chills, nausea, or vomiting noted at this time. 11/20/17 on evaluation today patient actually appears to be doing quite well in regard to his lower extremities on the left on the right he has several blisters that showed up although there's some question about whether or not he has had his broker compression wraps on like he was supposed to or not. Fortunately there's no signs of active infection at this time which is good news. Unfortunately though he is doing well from the standpoint of the left leg the right leg is not doing as well again this is when we did not wrap last week. TOMAZ, JANIS ([id number]) 11/28/18 on evaluation today patient appears to be doing well in  regard to his bilateral lower extremities is left appears to be healed is right is not healed but is very close to being so. Overall very pleased with how things seem to be progressing. Patient is likewise happy that things are doing well. 12/09/18 on evaluation today patient actually appears to be completely healed which is excellent news. He actually seems to be doing well in regard to the swelling of the bilateral lower extremities which is also great news. Overall very pleased with how things seem to be progressing. Readmission: 03/18/2019 upon evaluation today patient presents for reevaluation concerning issues that he is having with his left lower extremity where he does have a wound noted upon inspection today. He also has a wound on the right second toe on the tip where it is apparently been rubbing in his shoe I did look at issues and you can actually see where this has been occurring as well. Fortunately there is no signs of infection with regard to the toe necessarily although it is very swollen compared to normal that does have me somewhat concerned about the possibility of further evaluation for infection/osteomyelitis. I would recommend an x-ray to start with. Otherwise he states that it is been 1-2 weeks that he has had the draining in regard to left lower extremity he does not know how  this happened he has been wearing his compression appropriately and I do see that as well today but nonetheless I do think that he needs to continue to be very cautious with regard to elevation as well. 03/25/2019 on evaluation today patient appears to be doing much better in regard to his lower extremity at this time. That is on the left. He did culture positive for Pseudomonas but the good news is he seems to be doing much better the gentamicin we applied topically seems to have done a great job for him. There is no signs of active infection at this time systemically and locally he is doing much better.  No fevers, chills, nausea, vomiting, or diarrhea. In regard to his right toe this seems to be doing much better there is very little pressure noted at this point I did clean up some of the callus today around the edges of the wound as well as the surface of the wound but to be honest he is progressing quite nicely. 10/30; the area on the left anterior lower tibia area is healed over. His edema control is adequate even though his use of the compression pumps seems very intermittent. He has a juxta lite stocking on the right leg and a think there is 1 for the left leg at home. His wife is putting these on. He has an area on the plantar right second toe which is a hammertoe they are trying to offload this 04/08/2019 on evaluation today patient appears to be doing well in regard to his lower extremities which is good news. We are seeing him today for his toe ulcer which is giving him a little bit more trouble but again still seems to be doing better at this point which is good news. He is going require some sharp debridement in regard to the toe today however. 04/15/2019 on evaluation today patient actually appears to be doing quite well with regard to his toe ulcer. I am very pleased with how things are doing in that regard. With regard to his lower extremity edema in general he tells me he is not been using the lymphedema pumps regularly although he does not use them. I think that he needs to use them more regularly he does have a small blister on the left lower extremity this is not open at this point but obviously it something that can get worse if he does not keep the compression going and the pumps going as well. 04/22/2019 on evaluation today patient appears to be doing well with regard to his toe ulcer. He has been tolerating the dressing changes without complication. This is measuring slightly smaller this week as compared to last week. Fortunately there is no signs of active infection at this  time. 05/06/2019 on evaluation today patient actually appears to be doing excellent. In fact his toe appears to be completely healed which is great news. There is no signs of active infection at this time. Readmission: Patient presents today for follow-up after having been discharged at the beginning of December 2020. He states that in recent weeks things have reopened and has been having more trouble at this point. With that being said fortunately there is no signs of active infection at this time. No fever chills noted. Nonetheless he also does have an issue with one of his toes as well that seems to be somewhat this is on the right foot second toe. 07/25/2019 upon evaluation today patient's lower extremities appear to be doing excellent  bilaterally. I really feel like he is showing signs of great improvement and in fact I think he is close to healing which is great news. There is no signs of active infection at this time. No fevers, chills, nausea, vomiting, or diarrhea. 07/31/2019 upon evaluation today patient appears to be doing excellent and in fact I think may be completely healed in regard to his right lower extremity that were still to monitor this 1 more week before closing it out. The left lower extremity is measuring smaller although not completely closed seems to be doing excellent. Electronic Signature(s) Signed: 07/31/2019 1:00:44 PM By: Worthy Keeler PA-C Entered By: Worthy Keeler on 07/31/2019 13:00:43 Adam Merritt ([id number]) -------------------------------------------------------------------------------- Physical Exam Details Patient Name: Adam Merritt Date of Service: 07/31/2019 11:00 AM Medical Record Number: [id number] Patient Account Number: [id number] Date of Birth/Sex: Apr 05, 1946 (73 y.o. M) Treating RN: Army Melia Primary Care Provider: [name] Other Clinician: Referring Provider: [name] Treating Provider/Extender: STONE III, Earle Troiano [name]:  2 Constitutional Obese and well-hydrated in no acute distress. Respiratory normal breathing without difficulty. Psychiatric this patient is able to make decisions and demonstrates good insight into disease process. Alert and Oriented x 3. pleasant and cooperative. Notes Patient's wound bed currently showed signs on the left lower extremity had been very superficial and there was a lot of new epithelial growth in general I am extremely happy with how things seem to be progressing. The patient is likewise extremely pleased to hear that things are doing so well. We will see how he continues to progress. The right lower extremity I think may be sealed up but we could definitely wrap this 1 more week before discontinuing he was advised to bring his wraps with him next week. Electronic Signature(s) Signed: 07/31/2019 1:01:10 PM By: Worthy Keeler PA-C Entered By: Worthy Keeler on 07/31/2019 13:01:09 Adam Merritt ([id number]) -------------------------------------------------------------------------------- Physician Orders Details Patient Name: Adam Merritt Date of Service: 07/31/2019 11:00 AM Medical Record Number: [id number] Patient Account Number: [id number] Date of Birth/Sex: 1945/10/14 (73 y.o. 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 of other part of right foot limited to breakdown of skin E66.09 Other obesity due to excess calories Wound Cleansing Wound #26 Right,Lateral  Lower Leg o Cleanse wound with mild soap and water - in office Wound #27 Left,Lateral Lower Leg o Cleanse wound with mild soap and water - in office Primary Wound Dressing Wound #27 Left,Lateral Lower Leg o Silver Alginate Secondary Dressing Wound #26 Right,Lateral Lower Leg o ABD pad Wound #27 Left,Lateral Lower Leg o ABD pad Dressing Change Frequency Wound #26 Right,Lateral Lower Leg o Change dressing every week Wound #27 Left,Lateral Lower Leg o Change dressing every week Follow-up Appointments Wound #26 Right,Lateral Lower Leg o Return Appointment in 1 week. Wound #27 Left,Lateral Lower Leg o Return Appointment in 1 week. Edema Control Wound #26 Right,Lateral Lower Leg o 4 Layer Compression System - Bilateral Wound #27 Left,Lateral Lower Leg o 4 Layer Compression System - Bilateral ASCENCION, COYE ([id number]) Electronic Signature(s) Signed:  07/31/2019 11:52:49 AM By: Army Melia Signed: 07/31/2019 5:55:32 PM By: Worthy Keeler PA-C Entered By: Army Melia on 07/31/2019 11:40:30 Adam Merritt ([id number]) -------------------------------------------------------------------------------- Problem List Details Patient Name: Adam Merritt Date of Service: 07/31/2019 11:00 AM Medical Record Number: [id number] Patient Account Number: [id number] Date of Birth/Sex: July 12, 1945 (73 y.o. M) Treating RN: Army Melia Primary Care Provider: [name] Other Clinician: Referring Provider: [name] Treating Provider/Extender: Melburn Hake, Shaniqwa Horsman [name]: 2 Active Problems ICD-10 Evaluated Encounter Code Description Active Date Today Diagnosis I89.0 Lymphedema, not elsewhere classified 07/17/2019 No Yes L97.512 Non-pressure chronic ulcer of other part of right foot with fat layer exposed 07/17/2019 No Yes L97.822 Non-pressure chronic ulcer of other part of left lower leg with fat layer 07/17/2019 No Yes exposed E11.621 Type 2 diabetes mellitus with  foot ulcer 07/17/2019 No Yes L97.511 Non-pressure chronic ulcer of other part of right foot limited to breakdown 07/17/2019 No Yes of skin E66.09 Other obesity due to excess calories 07/17/2019 No Yes Inactive Problems Resolved Problems Electronic Signature(s) Signed: 07/31/2019 11:37:05 AM By: Worthy Keeler PA-C Entered By: Worthy Keeler on 07/31/2019 11:37:05 Adam Merritt ([id number]) -------------------------------------------------------------------------------- Progress Note Details Patient Name: Adam Merritt Date of Service: 07/31/2019 11:00 AM Medical Record Number: [id number] Patient Account Number: [id number] Date of Birth/Sex: 1945/11/27 (73 y.o. M) Treating RN: Army Melia Primary Care Provider: [name] Other Clinician: Referring Provider: [name] Treating Provider/Extender: Melburn Hake, Nylene Inlow [name]: 2 Subjective Chief Complaint Information obtained from Patient Bilateral LE Ulcers and right second toe ulcer History of Present Illness (HPI) 10/18/17-He is here for initial evaluation of bilateral lower extremity ulcerations in the presence of venous insufficiency and lymphedema. He has been seen by vascular medicine in the past, Dr. Lucky Cowboy, last seen in 2016. He does have a history of abnormal ABIs, which is to be expected given his lymphedema and venous insufficiency. According to Epic, it appears that all attempts for arterial evaluation and/or angiography were not follow through with by patient. He does have a history of being seen in lymphedema clinic in 2018, stopped going approximately 6 months ago stating "it didn't do any good". He does not have lymphedema pumps, he does not have custom fit compression wrap/stockings. He is diabetic and his recent A1c last month was 7.6. He admits to chronic bilateral lower extremity pain, no change in pain since blister and ulceration development. He is currently being treated with Levaquin for bronchitis. He has  home health and we will continue. 10/25/17-He is here in follow-up evaluation for bilateral lower extremity ulcerationssubtle he remains on Levaquin for bronchitis. Right lower extremity with no evidence of drainage or ulceration, persistent left lower extremity ulceration. He states that home health has not been out since his appointment. He went to [facility] on Tuesday, studies revealed: RIGHT ABI 0.9, TBI 0.6 LEFT ABI 1.1, TBI 0.6 with triphasic flow bilaterally. We will continue his same treatment plan. He has been educated on compression therapy and need for elevation. He will benefit from lymphedema pumps 11/01/17-He is here in follow-up evaluation for left lower extremity ulcer. The right lower extremity remains healed. He has home health services, but they have not been out to see the patient for 2-3 weeks. He states it home health physical therapy changed his dressing yesterday after therapy; he placed Ace wrap compression. We are still waiting for lymphedema pumps, reordered d/t need for company change. 11/08/17-He is here in follow-up evaluation for  left lower extremity ulcer. It is improved. Edema is significantly improved with compression therapy. We will continue with same treatment plan and he will follow-up next week. No word regarding lymphedema pumps 11/15/17-He is here in follow-up evaluation for left lower extremity ulcer. He is healed and will be discharged from wound care services. I have reached out to [facility] regarding his lymphedema pumps. They have been unable to reach the patient; the contact number they had with the patient's wife's cell phone and she has not answered any unrecognized calls. Contact should be made today, trial planned for next week; [facility] will continue to follow 11/27/17 on evaluation today patient has multiple blistered areas over the right lower extremity his left lower extremity appears to be doing okay. These blistered  areas show signs of no infection which is great news. With that being said he did have some necrotic skin overlying which was mechanically debrided away with saline and gauze today without complication. Overall post debridement the wounds appear to be doing better but in general his swelling seems to be increased. This is obviously not good news. I think this is what has given rise to the blisters. 12/04/17 on evaluation today patient presents for follow-up concerning his bilateral lower extremity edema in the right lower extremity ulcers. He has been tolerating the dressing changes without complication. With that being said he has had no real issues with the wraps which is also good news. Overall I'm pleased with the progress he's been making. 12/11/17 on evaluation today patient appears to be doing rather well in regard to his right lateral lower extremity ulcer. He's been tolerating the dressing changes without complication. Fortunately there does not appear to be any evidence of infection at this time. Overall I'm pleased with the progress that is being made. Unfortunately he has been in the hospital due to having what sounds to be a stomach virus/flu fortunately that is starting to get better. 12/18/17 on evaluation today patient actually appears to be doing very well in regard to his bilateral lower extremities the swelling is under fairly good control his lymphedema pumps are still not up and running quite yet. With that being said he does have several areas of opening noted as far as wounds are concerned mainly over the left lower extremity. With that being said I do believe once he gets lymphedema pumps this would least help you mention some the fluid and preventing this from occurring. Hopefully that will be set up soon sleeves are Artie in place at his home he just waiting for the machine. 12/25/17 on evaluation today patient actually appears to be doing excellent in fact all of his ulcers appear  to have resolved his legs appear very well. I do think he needs compression stockings we have discussed this and they are actually going to go to [facility] today to elastic therapy to get this fitted for him. I think that is definitely a good thing to do. Readmission: 04/09/18 upon evaluation today this patient is seen for readmission due to bilateral lower extremity lymphedema. He has significant swelling of his extremities especially on the left although the right is also swollen he has weeping from both sides. There are no obvious open wounds at this point. Fortunately he has been doing fairly well for quite a bit of time since I last saw him. Nonetheless unfortunately this seems to have reopened and is giving quite a bit of trouble. He states this began about a week ago  when he first called [place] to get in to be seen. No fevers, chills, nausea, or vomiting noted at this time. He has not been using his lymphedema pumps due to the fact that they won't fit on his leg at this point likewise is also not been using his compression for essentially the same reason. AADI, BORDNER ([id number]) 04/16/18 upon evaluation today patient actually appears to be doing a little better in regard to the fluid in his bilateral lower extremities. With that being said he's had three falls since I saw him last week. He also states that he's been feeling very poorly. I was concerned last week and feel like that the concern is still there as far as the congestion in his chest is concerned he seems to be breathing about the same as last week but again he states he's very weak he's not even able to walk further than from the chair to the door. His wife had to buy a wheelchair just to be able to get them out of the house to get to the appointment today. This has me very concerned. 04/23/18 on evaluation today patient actually appears to be doing much better than last week's evaluation. At that time actually had to transport him  to the ER via EMS and he subsequently was admitted for acute pulmonary edema, acute renal failure, and acute congestive heart failure. Fortunately he is doing much better. Apparently they did dialyze him and were able to take off roughly 35 pounds of fluid. Nonetheless he is feeling much better both in regard to his breathing and he's able to get around much better at this time compared to previous. Overall I'm very happy with how things are at this time. There does not appear to be any evidence of infection currently. No fevers, chills, nausea, or vomiting noted at this time. 04/30/2018 patient seen today for follow-up and management of bilateral lower extremity lymphedema. He did express being more sad today than usual due to the recent loss of his dog. He states that he has been compliant with using the lymphedema pumps. However he does admit a minute over the last 2-3 days he has not been using the pumps due to the recent loss of his dog. At this time there is no drainage or open wounds to his lower extremities. The left leg edema is measuring smaller today. Still has a significant amount of edema on bilateral lower extremities With dry flaky skin. He will be referred to the lymphedema clinic for further management. Will continue 3 layer compression wraps and follow-up in 1-2 weeks.Denies any pain, fever, chairs recently. No recent falls or injuries reported during this visit. 05/07/18 on evaluation today patient actually appears to be doing very well in regard to his lower extremities in general all things considered. With that being said he is having some pain in the legs just due to the amount of swelling. He does have an area where he had a blister on the left lateral lower extremity this is open at this point other than that there's nothing else weeping at this time. 05/14/18 on evaluation today patient actually appears to be doing excellent all things considered in regard to his lower  extremities. He still has a couple areas of weeping on each leg which has continued to be the issue for him. He does have an appointment with the lymphedema clinic although this isn't until February 2020. That was the earliest they had. In the meantime he has  continued to tolerate the compression wraps without complication. 05/28/18 on evaluation today patient actually appears to be doing more poorly in regard to his left lower extremity where he has a wound open at this point. He also had a fall where he subsequently injured his right great toe which has led to an open wound at the site unfortunately. He has been tolerating the dressing changes without complication in general as far as the wraps are concerned that he has not been putting any dressing on the left 1st toe ulcer site. 06/11/18 on evaluation today patient appears to be doing much worse in regard to his bilateral lower extremity ulcers. He has been tolerating the dressing changes without complication although his legs have not been wrapped more recently. Overall I am not very pleased with the way his legs appear. I do believe he needs to be back in compression wraps he still has not received his compression wraps from the [facility] hospital as of yet. 06/18/18 on evaluation today patient actually appears to be doing significantly better than last time I saw him. He has been tolerating the compression wraps without complication in the circumferential ulcers especially appear to be doing much better. His toe ulcer on the right in regard to the great toe is better although not as good as the legs in my pinion. No fevers chills noted 07/02/18 on evaluation today patient appears to be doing much better in regard to his lower extremity ulcers. Unfortunately since I last saw him he's had the distal portion of his right great toe if he dated it sounds as if this actually went downhill very quickly. I had only seen him a few days prior and the toe did not  appear to be infected at that point subsequently became infected very rapidly and it was decided by the surgeon that the distal portion of the toe needed to be removed. The patient seems to be doing well in this regard he tells me. With that being said his lower extremities are doing better from the standpoint of the wounds although he is significantly swollen at this point. 07/09/18 on evaluation today patient appears to be doing better in regard to the wounds on his lower extremities. In fact everything is almost completely healed he is just a small area on the left posterior lower extremity that is open at this point. 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 With that being said he needs some kind of compression it does sound like he may not be wearing his compression, that is the wraps, during the entire time between when he's here visit to visit. Apparently his wife took the current one off because it began to "fall apart". 07/16/18 on evaluation today patient appears to be extremely swollen especially in regard to his left lower extremity unfortunately. He also has a new skin tear over the left lower extremity and there's a smaller area on the right lower extremity as well. Unfortunately this seems to be due in part to blistering and fluid buildup in his leg. He did get the reduction wraps that were ordered by the [facility] hospital for him to go to lymphedema clinic. With that being said his wounds on the legs have not healed to the point to where they would likely accept them as a patient lymphedema clinic  currently. We need to try to get this to heal. With that being said he's been taking his wraps off which is not doing him any favors at this point. In fact this is probably quite counterproductive compared to what needs  to occur. We will likely need to increase to a four layer compression wrap and continue to also utilize elevation and he has to keep the wraps on not take them off as he's been doing currently hasn't had a wrap on since Saturday. 07/23/18 on evaluation today patient appears to be doing much better in regard to his bilateral lower extremities. In fact his left lower extremity which was the largest is actually 15 cm smaller today compared to what it was last time he was here in our clinic. This is obviously good news after just one week. Nonetheless the differences he actually kept the wraps on during the entire week this time. That's not typical for him. I do believe he understands a little bit better now the severity of the situation and why it's important for him to keep these wraps on. 07/30/18 on evaluation today patient actually appears to be doing rather well in regard to his lower extremities. His legs are much smaller than they have been in the past and he actually has only one very small rudder superficial region remaining that is not closed on the left lateral/posterior lower extremity even this is almost completely close. I do believe likely next week he will be healed without any complications. I do think we need to continue the wraps however this seems to be beneficial for him. I also think it may be a good time for [place] to go ahead and see about getting the appointment with the lymphedema clinic which is supposed to be made for him in order to keep this moving along and hopefully get them into compression wraps that will in the end help him to remain healed. OMRI, BERTRAN ([id number]) 08/06/18 on evaluation today patient actually appears to be doing very well in regard as bilateral lower extremities. In fact his wounds appear to be completely healed at this time. 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 He is in the process of  getting appointment with the lymphedema clinic we have made this referral were just waiting to hear back on the schedule time. We need to follow up on that today as well. 08/13/18 on evaluation today patient actually appears to be doing very well in regard to his bilateral lower extremities there are no open wounds at this point. We are gonna go ahead and see about ordering the Velcro compression wraps for him had a discussion with them about [place] doing it versus the [place] they feel like they can definitely afford going ahead and get the wraps themselves and they would prefer to try to avoid having to go to the lymphedema clinic if it all possible which I completely understand. As long as he has good compression I'm okay either way. 3/17//20 on evaluation today patient actually appears to be doing well in regard to his bilateral lower extremity ulcers. He has been tolerating the dressing changes without complication specifically the compression wraps. Overall is had no issues my fingers finding that I see at this point is that he is having trouble with constipation. He tells me he has not been able to go to the bathroom for about six days. He's taken to over-the-counter oral laxatives unfortunately this  is not helping. He has not contacted his doctor. 08/27/18 on evaluation today patient appears to be doing fairly well in regard to his lower extremities at this point. There does not appear to be any new altars is swelling is very well controlled. We are still waiting for his Velcro compression wraps to arrive that should be sometime in the next week is Artie sent the check for these. 09/03/18 on evaluation today patient appears to be doing excellent in regard to his bilateral lower extremities which he shows no signs of wound openings in regard to this point. He does have his Velcro compression wraps which did arrive in the mail since I saw him last week. Overall he is doing excellent in my pinion. 09/13/18  on evaluation today patient appears to be doing very well currently in regard to the overall appearance of his bilateral lower extremities although he's a little bit more swollen than last time we saw him. At that point he been discharged without any open wounds. Nonetheless he has a small open wound on the posterior left lower extremity with some evidence of cellulitis noted as well. Fortunately I feel like he has made good progress overall with regard to his lower extremities from were things used to be. 09/20/18 on evaluation today patient actually appears to be doing much better. The erythematous lower extremity is improving wound itself which is still open appears to be doing much better as far as but appearance as well as pain is concerned overall very pleased in this regard. There's no signs of active infection at this time. 09/27/18 on evaluation today patient's wounds on the lower extremity actually appear to be doing fairly well at this time which is good news. There is no evidence of active infection currently and again is just as left lower extremity were there any wounds at this point anyway. I believe they may be completely healed but again I'm not 100% sure based on evaluation today. I think one more week of observation would likely be a good idea. 10/04/18 on evaluation today patient actually appears to be doing excellent in regard to the left lower extremity which actually appears to be completely healed as of today. Unfortunately he's been having issues with his right lower extremity have a new wound that has opened. Fortunately there's no evidence of active infection at this time which is good news. 10/10/18 upon evaluation today patient actually appears to be doing a little bit worse with the new open area on his right posterior lower extremity. He's been tolerating the dressing changes without complication. Right now we been using his compression wraps although I think we may need to  switch back to actually performing bilateral compression wraps are in the clinic. No fevers, chills, nausea, or vomiting noted at this time. 10/17/18 on evaluation today patient actually appears to be doing quite well in regard to his bilateral lower extremity ulcers. He has been tolerating the reps without complication although he would prefer not to rewrap his legs as of today. Fortunately there's no signs of active infection which is good news. No fevers, chills, nausea, or vomiting noted at this time. 10/24/18 on evaluation today patient appears to be doing very well in regard to his lower extremities. His right lower extremity is shown signs of healing and his left lower Trinity though not healed appears to be improving which is excellent news. Overall very pleased with how things seem to be progressing at this time. The patient is  likewise happy to hear this. His Velcro compression wraps however have not been put on properly will gonna show his wife how to do that properly today. 10/31/18 on evaluation today patient appears to be doing more poorly in regard to his left lower extremity in particular although both lower extremities actually are showing some signs of being worse than my previous evaluation. Unfortunately I'm just not sure that his compression stockings even with the use of the compression/lymphedema pumps seem to be controlling this well. Upon further questioning he tells me that he also is not able to lie flat in the bed due to his congestive heart failure and difficulty breathing. For that reason he sleeps in his recliner. To make matters worse his recliner also cannot even hold his legs up so instead of even being somewhat elevated they pretty much hang down to the floor. This is the way he sleeps each night which is definitely counterproductive to everything else that were attempting to do from the standpoint of controlling his fluid. Nonetheless I think that he potentially could  benefit from a hospital bed although this would be something that his primary care provider would likely have to order since anything that is order on our side has to be directly related to wound care and again the hospital bed is not necessarily a direct relation to although I think it does contribute to his overall wound status on his lower extremities. 11/07/18 on evaluation today patient appears to be doing better in regard to his bilateral lower extremity. He's been tolerating the dressing changes without complication. We did in the interim since I last saw him switch to just using extras orbiting new alginate and that seems to have done much better for him. I'm very pleased with the overall progress that is made. 11/14/18 on evaluation today patient appears to be redoing rather well in regard to his left lower extremity ulcers which are the only ones remaining at this point. Fortunately there's no signs of active infection at this time which is good news. Overall been very pleased with how things seem to be progressing currently. No fevers, chills, nausea, or vomiting noted at this time. CULVER, FEIGHNER ([id number]) 11/20/17 on evaluation today patient actually appears to be doing quite well in regard to his lower extremities on the left on the right he has several blisters that showed up although there's some question about whether or not he has had his broker compression wraps on like he was supposed to or not. Fortunately there's no signs of active infection at this time which is good news. Unfortunately though he is doing well from the standpoint of the left leg the right leg is not doing as well again this is when we did not wrap last week. 11/28/18 on evaluation today patient appears to be doing well in regard to his bilateral lower extremities is left appears to be healed is right is not healed but is very close to being so. Overall very pleased with how things seem to be progressing. Patient  is likewise happy that things are doing well. 12/09/18 on evaluation today patient actually appears to be completely healed which is excellent news. He actually seems to be doing well in regard to the swelling of the bilateral lower extremities which is also great news. Overall very pleased with how things seem to be progressing. Readmission: 03/18/2019 upon evaluation today patient presents for reevaluation concerning issues that he is having with his left lower extremity where  he does have a wound noted upon inspection today. He also has a wound on the right second toe on the tip where it is apparently been rubbing in his shoe I did look at issues and you can actually see where this has been occurring as well. Fortunately there is no signs of infection with regard to the toe necessarily although it is very swollen compared to normal that does have me somewhat concerned about the possibility of further evaluation for infection/osteomyelitis. I would recommend an x-ray to start with. Otherwise he states that it is been 1-2 weeks that he has had the draining in regard to left lower extremity he does not know how this happened he has been wearing his compression appropriately and I do see that as well today but nonetheless I do think that he needs to continue to be very cautious with regard to elevation as well. 03/25/2019 on evaluation today patient appears to be doing much better in regard to his lower extremity at this time. That is on the left. He did culture positive for Pseudomonas but the good news is he seems to be doing much better the gentamicin we applied topically seems to have done a great job for him. There is no signs of active infection at this time systemically and locally he is doing much better. No fevers, chills, nausea, vomiting, or diarrhea. In regard to his right toe this seems to be doing much better there is very little pressure noted at this point I did clean up some of the  callus today around the edges of the wound as well as the surface of the wound but to be honest he is progressing quite nicely. 10/30; the area on the left anterior lower tibia area is healed over. His edema control is adequate even though his use of the compression pumps seems very intermittent. He has a juxta lite stocking on the right leg and a think there is 1 for the left leg at home. His wife is putting these on. He has an area on the plantar right second toe which is a hammertoe they are trying to offload this 04/08/2019 on evaluation today patient appears to be doing well in regard to his lower extremities which is good news. We are seeing him today for his toe ulcer which is giving him a little bit more trouble but again still seems to be doing better at this point which is good news. He is going require some sharp debridement in regard to the toe today however. 04/15/2019 on evaluation today patient actually appears to be doing quite well with regard to his toe ulcer. I am very pleased with how things are doing in that regard. With regard to his lower extremity edema in general he tells me he is not been using the lymphedema pumps regularly although he does not use them. I think that he needs to use them more regularly he does have a small blister on the left lower extremity this is not open at this point but obviously it something that can get worse if he does not keep the compression going and the pumps going as well. 04/22/2019 on evaluation today patient appears to be doing well with regard to his toe ulcer. He has been tolerating the dressing changes without complication. This is measuring slightly smaller this week as compared to last week. Fortunately there is no signs of active infection at this time. 05/06/2019 on evaluation today patient actually appears to be doing  excellent. In fact his toe appears to be completely healed which is great news. There is no signs of active infection  at this time. Readmission: Patient presents today for follow-up after having been discharged at the beginning of December 2020. He states that in recent weeks things have reopened and has been having more trouble at this point. With that being said fortunately there is no signs of active infection at this time. No fever chills noted. Nonetheless he also does have an issue with one of his toes as well that seems to be somewhat this is on the right foot second toe. 07/25/2019 upon evaluation today patient's lower extremities appear to be doing excellent bilaterally. I really feel like he is showing signs of great improvement and in fact I think he is close to healing which is great news. There is no signs of active infection at this time. No fevers, chills, nausea, vomiting, or diarrhea. 07/31/2019 upon evaluation today patient appears to be doing excellent and in fact I think may be completely healed in regard to his right lower extremity that were still to monitor this 1 more week before closing it out. The left lower extremity is measuring smaller although not completely closed seems to be doing excellent. Objective Constitutional Obese and well-hydrated in no acute distress. REEDER, BRISBY ([id number]) Vitals Time Taken: 11:11 AM, Height: 66 in, Weight: 292 lbs, BMI: 47.1, Temperature: 98.2 F, Pulse: 60 bpm, Respiratory Rate: 16 breaths/min, Blood Pressure: 146/71 mmHg. Respiratory normal breathing without difficulty. Psychiatric this patient is able to make decisions and demonstrates good insight into disease process. Alert and Oriented x 3. pleasant and cooperative. General Notes: Patient's wound bed currently showed signs on the left lower extremity had been very superficial and there was a lot of new epithelial growth in general I am extremely happy with how things seem to be progressing. The patient is likewise extremely pleased to hear that things are doing so well. We will see how he  continues to progress. The right lower extremity I think may be sealed up but we could definitely wrap this 1 more week before discontinuing he was advised to bring his wraps with him next week. Integumentary (Hair, Skin) Wound #26 status is Open. Original cause of wound was Gradually Appeared. The wound is located on the Right,Lateral Lower Leg. The wound measures 0.5cm length x 0.5cm width x 0.1cm depth; 0.196cm^2 area and 0.02cm^3 volume. There is Fat Layer (Subcutaneous Tissue) Exposed exposed. There is a large amount of serous drainage noted. The wound margin is indistinct and nonvisible. There is medium (34-66%) pink granulation within the wound bed. There is a medium (34-66%) amount of necrotic tissue within the wound bed including Adherent Slough. Wound #27 status is Open. Original cause of wound was Gradually Appeared. The wound is located on the Left,Lateral Lower Leg. The wound measures 3.5cm length x 7cm width x 0.1cm depth; 19.242cm^2 area and 1.924cm^3 volume. There is Fat Layer (Subcutaneous Tissue) Exposed exposed. There is a large amount of serous drainage noted. The wound margin is flat and intact. There is medium (34-66%) pink granulation within the wound bed. There is a medium (34-66%) amount of necrotic tissue within the wound bed including Adherent Slough. 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 There was a Four Layer Compression Therapy Procedure by Army Melia, RN. Post procedure Diagnosis Wound #26: Same as  Pre-Procedure Wound #27 Pre-procedure diagnosis of Wound #27 is a Lymphedema located on the Left,Lateral Lower Leg . There was a Four Layer Compression Therapy Procedure by Army Melia, RN. Post procedure Diagnosis Wound #27: Same as Pre-Procedure Plan Wound Cleansing: CONNOR, MEACHAM ([id number]) Wound #26 Right,Lateral Lower Leg: Cleanse wound with mild soap and water - in office Wound #27 Left,Lateral Lower Leg: Cleanse wound with mild soap and water - in office Primary Wound Dressing: Wound #27 Left,Lateral Lower Leg: Silver Alginate Secondary Dressing: Wound #26 Right,Lateral Lower Leg: ABD pad Wound #27 Left,Lateral Lower Leg: ABD pad Dressing Change Frequency: Wound #26 Right,Lateral Lower Leg: Change dressing every week Wound #27 Left,Lateral Lower Leg: Change dressing every week Follow-up Appointments: Wound #26 Right,Lateral Lower Leg: Return Appointment in 1 week. Wound #27 Left,Lateral Lower Leg: Return Appointment in 1 week. Edema Control: Wound #26 Right,Lateral Lower Leg: 4 Layer Compression System - Bilateral Wound #27 Left,Lateral Lower Leg: 4 Layer Compression System - Bilateral 1. I would recommend currently that we go ahead and continue with the current wound care measures which includes a 4-layer bilateral wrap for him. 2. We will use a silver alginate dressing on the left just an ABD pad on the right. 3. I do recommend he continue to elevate his legs is much as possible to keep edema under good control. We will see patient back for reevaluation in 1 week here in the clinic. If anything worsens or changes patient will contact our office for additional recommendations. Electronic Signature(s) Signed: 07/31/2019 1:01:46 PM By: Worthy Keeler PA-C Entered By: Worthy Keeler on 07/31/2019 13:01:46 Adam Merritt ([id number]) -------------------------------------------------------------------------------- SuperBill Details Patient Name: Adam Merritt Date of Service: 07/31/2019 Medical Record Number: [id number] Patient Account Number: [id number] Date of Birth/Sex: 1945-12-25 (73 y.o. M) Treating RN: Army Melia Primary Care Provider: [name] Other Clinician: Referring Provider: [name] Treating Provider/Extender: Melburn Hake, Catia Todorov [name]: 2 Diagnosis Coding ICD-10 Codes Code Description I89.0 Lymphedema, not elsewhere classified L97.512 Non-pressure chronic ulcer of other part of right foot with fat layer exposed L97.822 Non-pressure chronic ulcer of other part of left lower leg with fat layer exposed E11.621 Type 2 diabetes mellitus with foot ulcer L97.511 Non-pressure chronic ulcer of other part of right foot limited to breakdown of skin E66.09 Other obesity due to excess calories Facility Procedures CPT4: Description Modifier Quantity Code 80165537 [id number] BILATERAL: Application of multi-layer venous compression system; leg (below knee), including ankle 1 and foot. Physician Procedures CPT4 Code: 7867544 Description: 92010 - WC PHYS LEVEL 3 - EST PT Modifier: Quantity: 1 CPT4 Code: Description: ICD-10 Diagnosis Description I89.0 Lymphedema, not elsewhere classified L97.512 Non-pressure chronic ulcer of other part of right foot with fat layer expos L97.822 Non-pressure chronic ulcer of other part of left lower leg with fat layer e  E11.621 Type 2 diabetes mellitus with foot ulcer Modifier: ed xposed Quantity: Electronic Signature(s) Signed: 07/31/2019 1:02:17 PM By: Worthy Keeler PA-C Previous Signature: 07/31/2019 11:52:49 AM Version By: Army Melia Entered By: Worthy Keeler on 07/31/2019 13:02:17

## 2019-08-05 ENCOUNTER — Telehealth: Payer: Self-pay

## 2019-08-05 DIAGNOSIS — I509 Heart failure, unspecified: Secondary | ICD-10-CM | POA: Diagnosis not present

## 2019-08-05 NOTE — Telephone Encounter (Signed)
Confirmed appointment on 08/07/2019 and screened for covid. [facility]

## 2019-08-07 ENCOUNTER — Encounter: Payer: Medicare PPO | Attending: Physician Assistant | Admitting: Physician Assistant

## 2019-08-07 ENCOUNTER — Ambulatory Visit: Payer: Medicare Other | Admitting: Internal Medicine

## 2019-08-07 ENCOUNTER — Other Ambulatory Visit: Payer: Self-pay

## 2019-08-07 DIAGNOSIS — I872 Venous insufficiency (chronic) (peripheral): Secondary | ICD-10-CM | POA: Insufficient documentation

## 2019-08-07 DIAGNOSIS — J449 Chronic obstructive pulmonary disease, unspecified: Secondary | ICD-10-CM | POA: Insufficient documentation

## 2019-08-07 DIAGNOSIS — I110 Hypertensive heart disease with heart failure: Secondary | ICD-10-CM | POA: Diagnosis not present

## 2019-08-07 DIAGNOSIS — W19XXXD Unspecified fall, subsequent encounter: Secondary | ICD-10-CM | POA: Insufficient documentation

## 2019-08-07 DIAGNOSIS — I509 Heart failure, unspecified: Secondary | ICD-10-CM | POA: Diagnosis not present

## 2019-08-07 DIAGNOSIS — K5900 Constipation, unspecified: Secondary | ICD-10-CM | POA: Diagnosis not present

## 2019-08-07 DIAGNOSIS — G4730 Sleep apnea, unspecified: Secondary | ICD-10-CM | POA: Diagnosis not present

## 2019-08-07 DIAGNOSIS — L97822 Non-pressure chronic ulcer of other part of left lower leg with fat layer exposed: Secondary | ICD-10-CM | POA: Insufficient documentation

## 2019-08-07 DIAGNOSIS — S99921A Unspecified injury of right foot, initial encounter: Secondary | ICD-10-CM | POA: Insufficient documentation

## 2019-08-07 DIAGNOSIS — E1140 Type 2 diabetes mellitus with diabetic neuropathy, unspecified: Secondary | ICD-10-CM | POA: Diagnosis not present

## 2019-08-07 DIAGNOSIS — I2510 Atherosclerotic heart disease of native coronary artery without angina pectoris: Secondary | ICD-10-CM | POA: Insufficient documentation

## 2019-08-07 DIAGNOSIS — I890 Lymphedema, not elsewhere classified: Secondary | ICD-10-CM | POA: Insufficient documentation

## 2019-08-07 DIAGNOSIS — E1151 Type 2 diabetes mellitus with diabetic peripheral angiopathy without gangrene: Secondary | ICD-10-CM | POA: Insufficient documentation

## 2019-08-07 DIAGNOSIS — L97512 Non-pressure chronic ulcer of other part of right foot with fat layer exposed: Secondary | ICD-10-CM | POA: Diagnosis not present

## 2019-08-07 DIAGNOSIS — E668 Other obesity: Secondary | ICD-10-CM | POA: Insufficient documentation

## 2019-08-07 DIAGNOSIS — M1990 Unspecified osteoarthritis, unspecified site: Secondary | ICD-10-CM | POA: Diagnosis not present

## 2019-08-07 DIAGNOSIS — Z6841 Body Mass Index (BMI) 40.0 and over, adult: Secondary | ICD-10-CM | POA: Diagnosis not present

## 2019-08-07 DIAGNOSIS — G8929 Other chronic pain: Secondary | ICD-10-CM | POA: Insufficient documentation

## 2019-08-07 DIAGNOSIS — E11621 Type 2 diabetes mellitus with foot ulcer: Secondary | ICD-10-CM | POA: Insufficient documentation

## 2019-08-07 DIAGNOSIS — L97819 Non-pressure chronic ulcer of other part of right lower leg with unspecified severity: Secondary | ICD-10-CM | POA: Diagnosis not present

## 2019-08-07 NOTE — Progress Notes (Addendum)
CADE, DASHNER ([id number]) Visit Report for 08/07/2019 Chief Complaint Document Details Patient Name: Adam Merritt, Adam Merritt Date of Service: 08/07/2019 2:00 PM Medical Record Number: [id number] Patient Account Number: [id number] Date of Birth/Sex: January 05, 1946 (73 y.o. M) Treating RN: Army Melia Primary Care Provider: [name] Other Clinician: Referring Provider: [name] Treating Provider/Extender: Melburn Hake, Robbie Nangle [name]: 3 Information Obtained from: Patient Chief Complaint Bilateral LE Ulcers and right second toe ulcer Electronic Signature(s) Signed: 08/07/2019 2:26:48 PM By: Worthy Keeler PA-C Entered By: Worthy Keeler on 08/07/2019 14:26:48 Adam Merritt ([id number]) -------------------------------------------------------------------------------- HPI Details Patient Name: Adam Merritt Date of Service: 08/07/2019 2:00 PM Medical Record Number: [id number] Patient Account Number: [id number] Date of Birth/Sex: 05/05/1946 (73 y.o. M) Treating RN: Army Melia Primary Care Provider: [name] Other Clinician: Referring Provider: [name] Treating Provider/Extender: Melburn Hake, Gracielynn Birkel [name]: 3 History of Present Illness HPI Description: 10/18/17-He is here for initial evaluation of bilateral lower extremity ulcerations in the presence of venous insufficiency and lymphedema. He has been seen by vascular medicine in the past, Dr. Lucky Cowboy, last seen in 2016. He does have a history of abnormal ABIs, which is to be expected given his lymphedema and venous insufficiency. According to Epic, it appears that all attempts for arterial evaluation and/or angiography were not follow through with by patient. He does have a history of being seen in lymphedema clinic in 2018, stopped going approximately 6 months ago stating "it didn't do any good". He does not have lymphedema pumps, he does not have custom fit compression wrap/stockings. He is diabetic and his recent A1c last  month was 7.6. He admits to chronic bilateral lower extremity pain, no change in pain since blister and ulceration development. He is currently being treated with Levaquin for bronchitis. He has home health and we will continue. 10/25/17-He is here in follow-up evaluation for bilateral lower extremity ulcerationssubtle he remains on Levaquin for bronchitis. Right lower extremity with no evidence of drainage or ulceration, persistent left lower extremity ulceration. He states that home health has not been out since his appointment. He went to [facility] on Tuesday, studies revealed: RIGHT ABI 0.9, TBI 0.6 LEFT ABI 1.1, TBI 0.6 with triphasic flow bilaterally. We will continue his same treatment plan. He has been educated on compression therapy and need for elevation. He will benefit from lymphedema pumps 11/01/17-He is here in follow-up evaluation for left lower extremity ulcer. The right lower extremity remains healed. He has home health services, but they have not been out to see the patient for 2-3 weeks. He states it home health physical therapy changed his dressing yesterday after therapy; he placed Ace wrap compression. We are still waiting for lymphedema pumps, reordered d/t need for company change. 11/08/17-He is here in follow-up evaluation for left lower extremity ulcer. It is improved. Edema is significantly improved with compression therapy. We will continue with same treatment plan and he will follow-up next week. No word regarding lymphedema pumps 11/15/17-He is here in follow-up evaluation for left lower extremity ulcer. He is healed and will be discharged from wound care services. I have reached out to [facility] regarding his lymphedema pumps. They have been unable to reach the patient; the contact number they had with the patient's wife's cell phone and she has not answered any unrecognized calls. Contact should be made today, trial planned for next week; [facility] will continue to follow 11/27/17 on evaluation today patient has multiple blistered  areas over the right lower extremity his left lower extremity appears to be doing okay. These blistered areas show signs of no infection which is great news. With that being said he did have some necrotic skin overlying which was mechanically debrided away with saline and gauze today without complication. Overall post debridement the wounds appear to be doing better but in general his swelling seems to be increased. This is obviously not good news. I think this is what has given rise to the blisters. 12/04/17 on evaluation today patient presents for follow-up concerning his bilateral lower extremity edema in the right lower extremity ulcers. He has been tolerating the dressing changes without complication. With that being said he has had no real issues with the wraps which is also good news. Overall I'm pleased with the progress he's been making. 12/11/17 on evaluation today patient appears to be doing rather well in regard to his right lateral lower extremity ulcer. He's been tolerating the dressing changes without complication. Fortunately there does not appear to be any evidence of infection at this time. Overall I'm pleased with the progress that is being made. Unfortunately he has been in the hospital due to having what sounds to be a stomach virus/flu fortunately that is starting to get better. 12/18/17 on evaluation today patient actually appears to be doing very well in regard to his bilateral lower extremities the swelling is under fairly good control his lymphedema pumps are still not up and running quite yet. With that being said he does have several areas of opening noted as far as wounds are concerned mainly over the left lower extremity. With that being said I do believe once he gets lymphedema pumps this would least help you mention some the fluid and preventing this from occurring. Hopefully that  will be set up soon sleeves are Artie in place at his home he just waiting for the machine. 12/25/17 on evaluation today patient actually appears to be doing excellent in fact all of his ulcers appear to have resolved his legs appear very well. I do think he needs compression stockings we have discussed this and they are actually going to go to [place] today to elastic therapy to get this fitted for him. I think that is definitely a good thing to do. Readmission: 04/09/18 upon evaluation today this patient is seen for readmission due to bilateral lower extremity lymphedema. He has significant swelling of his extremities especially on the left although the right is also swollen he has weeping from both sides. There are no obvious open wounds at this point. Fortunately he has been doing fairly well for quite a bit of time since I last saw him. Nonetheless unfortunately this seems to have reopened and is giving quite a bit of trouble. He states this began about a week ago when he first called [place] to get in to be seen. No fevers, chills, nausea, or vomiting noted at this time. He has not been using his lymphedema pumps due to the fact that they won't fit on his leg at this point likewise is also not been using his compression for essentially the same reason. 04/16/18 upon evaluation today patient actually appears to be doing a little better in regard to the fluid in his bilateral lower extremities. With that being said he's had three falls since I saw him last week. He also states that he's been feeling very poorly. I was concerned last week and feel like that the concern is still there as  far as the congestion in his chest is concerned he seems to be breathing about the same as last week but again he states he's very weak he's not Adam Merritt able to walk further than from the chair to the door. His wife had to buy a wheelchair just to be able to get them out of the Adam Merritt, Adam Merritt ([id number]) house to get to  the appointment today. This has me very concerned. 04/23/18 on evaluation today patient actually appears to be doing much better than last week's evaluation. At that time actually had to transport him to the ER via EMS and he subsequently was admitted for acute pulmonary edema, acute renal failure, and acute congestive heart failure. Fortunately he is doing much better. Apparently they did dialyze him and were able to take off roughly 35 pounds of fluid. Nonetheless he is feeling much better both in regard to his breathing and he's able to get around much better at this time compared to previous. Overall I'm very happy with how things are at this time. There does not appear to be any evidence of infection currently. No fevers, chills, nausea, or vomiting noted at this time. 04/30/2018 patient seen today for follow-up and management of bilateral lower extremity lymphedema. He did express being more sad today than usual due to the recent loss of his dog. He states that he has been compliant with using the lymphedema pumps. However he does admit a minute over the last 2-3 days he has not been using the pumps due to the recent loss of his dog. At this time there is no drainage or open wounds to his lower extremities. The left leg edema is measuring smaller today. Still has a significant amount of edema on bilateral lower extremities With dry flaky skin. He will be referred to the lymphedema clinic for further management. Will continue 3 layer compression wraps and follow-up in 1-2 weeks.Denies any pain, fever, chairs recently. No recent falls or injuries reported during this visit. 05/07/18 on evaluation today patient actually appears to be doing very well in regard to his lower extremities in general all things considered. With that being said he is having some pain in the legs just due to the amount of swelling. He does have an area where he had a blister on the left lateral lower extremity this is open  at this point other than that there's nothing else weeping at this time. 05/14/18 on evaluation today patient actually appears to be doing excellent all things considered in regard to his lower extremities. He still has a couple areas of weeping on each leg which has continued to be the issue for him. He does have an appointment with the lymphedema clinic although this isn't until February 2020. That was the earliest they had. In the meantime he has continued to tolerate the compression wraps without complication. 05/28/18 on evaluation today patient actually appears to be doing more poorly in regard to his left lower extremity where he has a wound open at this point. He also had a fall where he subsequently injured his right great toe which has led to an open wound at the site unfortunately. He has been tolerating the dressing changes without complication in general as far as the wraps are concerned that he has not been putting any dressing on the left 1st toe ulcer site. 06/11/18 on evaluation today patient appears to be doing much worse in regard to his bilateral lower extremity ulcers. He has been tolerating the  dressing changes without complication although his legs have not been wrapped more recently. Overall I am not very pleased with the way his legs appear. I do believe he needs to be back in compression wraps he still has not received his compression wraps from the [facility] hospital as of yet. 06/18/18 on evaluation today patient actually appears to be doing significantly better than last time I saw him. He has been tolerating the compression wraps without complication in the circumferential ulcers especially appear to be doing much better. His toe ulcer on the right in regard to the great toe is better although not as good as the legs in my pinion. No fevers chills noted 07/02/18 on evaluation today patient appears to be doing much better in regard to his lower extremity ulcers. Unfortunately since I  last saw him he's had the distal portion of his right great toe if he dated it sounds as if this actually went downhill very quickly. I had only seen him a few days prior and the toe did not appear to be infected at that point subsequently became infected very rapidly and it was decided by the surgeon that the distal portion of the toe needed to be removed. The patient seems to be doing well in this regard he tells me. With that being said his lower extremities are doing better from the standpoint of the wounds although he is significantly swollen at this point. 07/09/18 on evaluation today patient appears to be doing better in regard to the wounds on his lower extremities. In fact everything is almost completely healed he is just a small area on the left posterior lower extremity that is open at this point. 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 With that being said he needs some kind of compression it does sound like he may not be wearing his compression, that is the wraps, during the entire time between when he's here visit to visit. Apparently his wife took the current one off because it began to "fall apart". 07/16/18 on evaluation today patient appears to be extremely swollen especially in regard to his left lower extremity unfortunately. He also has a new skin tear over the left lower extremity and there's a smaller area on the right lower extremity as well. Unfortunately this seems to be due in part to blistering and fluid buildup in his leg. He did get the reduction wraps that were ordered by the [facility] hospital for him to go to lymphedema clinic. With that being said his wounds on the legs have not healed to the point to where they would likely accept them as a patient lymphedema clinic currently. We need to try  to get this to heal. With that being said he's been taking his wraps off which is not doing him any favors at this point. In fact this is probably quite counterproductive compared to what needs to occur. We will likely need to increase to a four layer compression wrap and continue to also utilize elevation and he has to keep the wraps on not take them off as he's been doing currently hasn't had a wrap on since Saturday. 07/23/18 on evaluation today patient appears to be doing much better in regard to his bilateral lower extremities. In fact his left lower extremity which was the largest is actually 45  cm smaller today compared to what it was last time he was here in our clinic. This is obviously good news after just one week. Nonetheless the differences he actually kept the wraps on during the entire week this time. That's not typical for him. I do believe he understands a little bit better now the severity of the situation and why it's important for him to keep these wraps on. 07/30/18 on evaluation today patient actually appears to be doing rather well in regard to his lower extremities. His legs are much smaller than they have been in the past and he actually has only one very small rudder superficial region remaining that is not closed on the left lateral/posterior lower extremity Adam Merritt this is almost completely close. I do believe likely next week he will be healed without any complications. I do think we need to continue the wraps however this seems to be beneficial for him. I also think it may be a good time for [place] to go ahead and see about getting the appointment with the lymphedema clinic which is supposed to be made for him in order to keep this moving along and hopefully get them into compression wraps that will in the end help him to remain healed. 08/06/18 on evaluation today patient actually appears to be doing very well in regard as bilateral lower extremities. In fact his wounds appear to  be completely healed at this time. 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 He is in the process of getting appointment with the lymphedema clinic we have made this referral were just waiting to hear back on the schedule time. We need to follow up on that today as well. Adam Merritt, Adam Merritt ([id number]) 08/13/18 on evaluation today patient actually appears to be doing very well in regard to his bilateral lower extremities there are no open wounds at this point. We are gonna go ahead and see about ordering the Velcro compression wraps for him had a discussion with them about [place] doing it versus the [place] they feel like they can definitely afford going ahead and get the wraps themselves and they would prefer to try to avoid having to go to the lymphedema clinic if it all possible which I completely understand. As long as he has good compression I'm okay either way. 3/17//20 on evaluation today patient actually appears to be doing well in regard to his bilateral lower extremity ulcers. He has been tolerating the dressing changes without complication specifically the compression wraps. Overall is had no issues my fingers finding that I see at this point is that he is having trouble with constipation. He tells me he has not been able to go to the bathroom for about six days. He's taken to over-the-counter oral laxatives unfortunately this is not helping. He has not contacted his doctor. 08/27/18 on evaluation today patient appears to be doing fairly well in regard to his lower extremities at this point. There does not appear to be any new altars is swelling is very well controlled. We are still waiting for his Velcro compression wraps to arrive that should be sometime in the next week is Artie sent the check for these. 09/03/18 on evaluation today patient appears to be doing excellent in regard to his bilateral lower extremities which he shows no signs of  wound openings in regard to this point. He does have his Velcro compression wraps which did arrive in the mail since I saw  him last week. Overall he is doing excellent in my pinion. 09/13/18 on evaluation today patient appears to be doing very well currently in regard to the overall appearance of his bilateral lower extremities although he's a little bit more swollen than last time we saw him. At that point he been discharged without any open wounds. Nonetheless he has a small open wound on the posterior left lower extremity with some evidence of cellulitis noted as well. Fortunately I feel like he has made good progress overall with regard to his lower extremities from were things used to be. 09/20/18 on evaluation today patient actually appears to be doing much better. The erythematous lower extremity is improving wound itself which is still open appears to be doing much better as far as but appearance as well as pain is concerned overall very pleased in this regard. There's no signs of active infection at this time. 09/27/18 on evaluation today patient's wounds on the lower extremity actually appear to be doing fairly well at this time which is good news. There is no evidence of active infection currently and again is just as left lower extremity were there any wounds at this point anyway. I believe they may be completely healed but again I'm not 100% sure based on evaluation today. I think one more week of observation would likely be a good idea. 10/04/18 on evaluation today patient actually appears to be doing excellent in regard to the left lower extremity which actually appears to be completely healed as of today. Unfortunately he's been having issues with his right lower extremity have a new wound that has opened. Fortunately there's no evidence of active infection at this time which is good news. 10/10/18 upon evaluation today patient actually appears to be doing a little bit worse with the new  open area on his right posterior lower extremity. He's been tolerating the dressing changes without complication. Right now we been using his compression wraps although I think we may need to switch back to actually performing bilateral compression wraps are in the clinic. No fevers, chills, nausea, or vomiting noted at this time. 10/17/18 on evaluation today patient actually appears to be doing quite well in regard to his bilateral lower extremity ulcers. He has been tolerating the reps without complication although he would prefer not to rewrap his legs as of today. Fortunately there's no signs of active infection which is good news. No fevers, chills, nausea, or vomiting noted at this time. 10/24/18 on evaluation today patient appears to be doing very well in regard to his lower extremities. His right lower extremity is shown signs of healing and his left lower [place] though not healed appears to be improving which is excellent news. Overall very pleased with how things seem to be progressing at this time. The patient is likewise happy to hear this. His Velcro compression wraps however have not been put on properly will gonna show his wife how to do that properly today. 10/31/18 on evaluation today patient appears to be doing more poorly in regard to his left lower extremity in particular although both lower extremities actually are showing some signs of being worse than my previous evaluation. Unfortunately I'm just not sure that his compression stockings Adam Merritt with the use of the compression/lymphedema pumps seem to be controlling this well. Upon further questioning he tells me that he also is not able to lie flat in the bed due to his congestive heart failure and difficulty breathing. For that reason he  sleeps in his recliner. To make matters worse his recliner also cannot Adam Merritt hold his legs up so instead of Adam Merritt being somewhat elevated they pretty much hang down to the floor. This is the way he  sleeps each night which is definitely counterproductive to everything else that were attempting to do from the standpoint of controlling his fluid. Nonetheless I think that he potentially could benefit from a hospital bed although this would be something that his primary care provider would likely have to order since anything that is order on our side has to be directly related to wound care and again the hospital bed is not necessarily a direct relation to although I think it does contribute to his overall wound status on his lower extremities. 11/07/18 on evaluation today patient appears to be doing better in regard to his bilateral lower extremity. He's been tolerating the dressing changes without complication. We did in the interim since I last saw him switch to just using extras orbiting new alginate and that seems to have done much better for him. I'm very pleased with the overall progress that is made. 11/14/18 on evaluation today patient appears to be redoing rather well in regard to his left lower extremity ulcers which are the only ones remaining at this point. Fortunately there's no signs of active infection at this time which is good news. Overall been very pleased with how things seem to be progressing currently. No fevers, chills, nausea, or vomiting noted at this time. 11/20/17 on evaluation today patient actually appears to be doing quite well in regard to his lower extremities on the left on the right he has several blisters that showed up although there's some question about whether or not he has had his broker compression wraps on like he was supposed to or not. Fortunately there's no signs of active infection at this time which is good news. Unfortunately though he is doing well from the standpoint of the left leg the right leg is not doing as well again this is when we did not wrap last week. Adam Merritt, Adam Merritt ([id number]) 11/28/18 on evaluation today patient appears to be doing well in  regard to his bilateral lower extremities is left appears to be healed is right is not healed but is very close to being so. Overall very pleased with how things seem to be progressing. Patient is likewise happy that things are doing well. 12/09/18 on evaluation today patient actually appears to be completely healed which is excellent news. He actually seems to be doing well in regard to the swelling of the bilateral lower extremities which is also great news. Overall very pleased with how things seem to be progressing. Readmission: 03/18/2019 upon evaluation today patient presents for reevaluation concerning issues that he is having with his left lower extremity where he does have a wound noted upon inspection today. He also has a wound on the right second toe on the tip where it is apparently been rubbing in his shoe I did look at issues and you can actually see where this has been occurring as well. Fortunately there is no signs of infection with regard to the toe necessarily although it is very swollen compared to normal that does have me somewhat concerned about the possibility of further evaluation for infection/osteomyelitis. I would recommend an x-ray to start with. Otherwise he states that it is been 1-2 weeks that he has had the draining in regard to left lower extremity he does not know how  this happened he has been wearing his compression appropriately and I do see that as well today but nonetheless I do think that he needs to continue to be very cautious with regard to elevation as well. 03/25/2019 on evaluation today patient appears to be doing much better in regard to his lower extremity at this time. That is on the left. He did culture positive for Pseudomonas but the good news is he seems to be doing much better the gentamicin we applied topically seems to have done a great job for him. There is no signs of active infection at this time systemically and locally he is doing much better.  No fevers, chills, nausea, vomiting, or diarrhea. In regard to his right toe this seems to be doing much better there is very little pressure noted at this point I did clean up some of the callus today around the edges of the wound as well as the surface of the wound but to be honest he is progressing quite nicely. 10/30; the area on the left anterior lower tibia area is healed over. His edema control is adequate Adam Merritt though his use of the compression pumps seems very intermittent. He has a juxta lite stocking on the right leg and a think there is 1 for the left leg at home. His wife is putting these on. He has an area on the plantar right second toe which is a hammertoe they are trying to offload this 04/08/2019 on evaluation today patient appears to be doing well in regard to his lower extremities which is good news. We are seeing him today for his toe ulcer which is giving him a little bit more trouble but again still seems to be doing better at this point which is good news. He is going require some sharp debridement in regard to the toe today however. 04/15/2019 on evaluation today patient actually appears to be doing quite well with regard to his toe ulcer. I am very pleased with how things are doing in that regard. With regard to his lower extremity edema in general he tells me he is not been using the lymphedema pumps regularly although he does not use them. I think that he needs to use them more regularly he does have a small blister on the left lower extremity this is not open at this point but obviously it something that can get worse if he does not keep the compression going and the pumps going as well. 04/22/2019 on evaluation today patient appears to be doing well with regard to his toe ulcer. He has been tolerating the dressing changes without complication. This is measuring slightly smaller this week as compared to last week. Fortunately there is no signs of active infection at this  time. 05/06/2019 on evaluation today patient actually appears to be doing excellent. In fact his toe appears to be completely healed which is great news. There is no signs of active infection at this time. Readmission: Patient presents today for follow-up after having been discharged at the beginning of December 2020. He states that in recent weeks things have reopened and has been having more trouble at this point. With that being said fortunately there is no signs of active infection at this time. No fever chills noted. Nonetheless he also does have an issue with one of his toes as well that seems to be somewhat this is on the right foot second toe. 07/25/2019 upon evaluation today patient's lower extremities appear to be doing excellent  bilaterally. I really feel like he is showing signs of great improvement and in fact I think he is close to healing which is great news. There is no signs of active infection at this time. No fevers, chills, nausea, vomiting, or diarrhea. 07/31/2019 upon evaluation today patient appears to be doing excellent and in fact I think may be completely healed in regard to his right lower extremity that were still to monitor this 1 more week before closing it out. The left lower extremity is measuring smaller although not completely closed seems to be doing excellent. 08/07/2019 upon evaluation today patient appears to be doing excellent in regard to his wounds. In fact the right lower extremity is completely healed there is no issues here. The left lower extremity has 1 very small area still remaining fortunately there is no signs of infection and overall I think he is very close to closure of the here as well. Electronic Signature(s) Signed: 08/07/2019 2:33:52 PM By: Worthy Keeler PA-C Entered By: Worthy Keeler on 08/07/2019 14:33:52 Adam Merritt ([id number]) -------------------------------------------------------------------------------- Physical Exam  Details Patient Name: Adam Merritt Date of Service: 08/07/2019 2:00 PM Medical Record Number: [id number] Patient Account Number: [id number] Date of Birth/Sex: 03-08-1946 (73 y.o. M) Treating RN: Army Melia Primary Care Provider: [name] Other Clinician: Referring Provider: [name] Treating Provider/Extender: STONE III, Lynnet Hefley [name]: 3 Constitutional Well-nourished and well-hydrated in no acute distress. Respiratory normal breathing without difficulty. Psychiatric this patient is able to make decisions and demonstrates good insight into disease process. Alert and Oriented x 3. pleasant and cooperative. Notes Patient's wound on the left lateral lower extremity is very tiny he is almost completely healed I would recommend 1 more week with the wrap on just to ensure that this completely seals up and then we can discharge him healed. The patient is in agreement with that plan. Electronic Signature(s) Signed: 08/07/2019 2:34:12 PM By: Worthy Keeler PA-C Entered By: Worthy Keeler on 08/07/2019 14:34:12 Adam Merritt ([id number]) -------------------------------------------------------------------------------- Physician Orders Details Patient Name: Adam Merritt Date of Service: 08/07/2019 2:00 PM Medical Record Number: [id number] Patient Account Number: [id number] Date of Birth/Sex: 13-Apr-1946 (73 y.o. 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 E11.621 Type 2 diabetes mellitus with foot ulcer L97.511 Non-pressure chronic ulcer of other part  of right foot limited to breakdown of skin E66.09 Other obesity due to excess calories Wound Cleansing Wound #27 Left,Lateral Lower Leg o Cleanse wound with mild soap and water - in office Primary Wound Dressing Wound #27 Left,Lateral Lower Leg o Silver Alginate Secondary Dressing Wound #27 Left,Lateral Lower Leg o ABD pad Dressing Change Frequency Wound #27 Left,Lateral Lower Leg o Change dressing every week Follow-up Appointments Wound #27 Left,Lateral Lower Leg o Return Appointment in 1 week. Edema Control Wound #27 Left,Lateral Lower Leg o 4-Layer Compression System - Left Lower Extremity. Electronic Signature(s) Signed: 08/07/2019 3:26:37 PM By: Army Melia Signed: 08/07/2019 5:37:52 PM By: Worthy Keeler PA-C Entered By: Army Melia on 08/07/2019 14:41:28 Adam Merritt ([id number]) -------------------------------------------------------------------------------- Problem List Details Patient Name: KEVAUGHN, EWING. Date of Service: 08/07/2019 2:00  PM Medical Record Number: [id number] Patient Account Number: [id number] Date of Birth/Sex: 1945/07/19 (74 y.o. M) Treating RN: Army Melia Primary Care Provider: [name] Other Clinician: Referring Provider: [name] Treating Provider/Extender: Melburn Hake, Javaun Dimperio [name]: 3 Active Problems ICD-10 Evaluated Encounter Code Description Active Date Today Diagnosis I89.0 Lymphedema, not elsewhere classified 07/17/2019 No Yes L97.512 Non-pressure chronic ulcer of other part of right foot with fat layer exposed 07/17/2019 No Yes L97.822 Non-pressure chronic ulcer of other part of left lower leg with fat layer 07/17/2019 No Yes exposed E11.621 Type 2 diabetes mellitus with foot ulcer 07/17/2019 No Yes L97.511 Non-pressure chronic ulcer of other part of right foot limited to breakdown 07/17/2019 No Yes of skin E66.09 Other obesity due to excess calories 07/17/2019 No Yes Inactive Problems Resolved  Problems Electronic Signature(s) Signed: 08/07/2019 2:26:42 PM By: Worthy Keeler PA-C Entered By: Worthy Keeler on 08/07/2019 14:26:41 Adam Merritt ([id number]) -------------------------------------------------------------------------------- Progress Note Details Patient Name: Adam Merritt Date of Service: 08/07/2019 2:00 PM Medical Record Number: [id number] Patient Account Number: [id number] Date of Birth/Sex: Dec 08, 1945 (73 y.o. M) Treating RN: Army Melia Primary Care Provider: [name] Other Clinician: Referring Provider: [name] Treating Provider/Extender: Melburn Hake, Saffron Busey [name]: 3 Subjective Chief Complaint Information obtained from Patient Bilateral LE Ulcers and right second toe ulcer History of Present Illness (HPI) 10/18/17-He is here for initial evaluation of bilateral lower extremity ulcerations in the presence of venous insufficiency and lymphedema. He has been seen by vascular medicine in the past, Dr. Lucky Cowboy, last seen in 2016. He does have a history of abnormal ABIs, which is to be expected given his lymphedema and venous insufficiency. According to Epic, it appears that all attempts for arterial evaluation and/or angiography were not follow through with by patient. He does have a history of being seen in lymphedema clinic in 2018, stopped going approximately 6 months ago stating "it didn't do any good". He does not have lymphedema pumps, he does not have custom fit compression wrap/stockings. He is diabetic and his recent A1c last month was 7.6. He admits to chronic bilateral lower extremity pain, no change in pain since blister and ulceration development. He is currently being treated with Levaquin for bronchitis. He has home health and we will continue. 10/25/17-He is here in follow-up evaluation for bilateral lower extremity ulcerationssubtle he remains on Levaquin for bronchitis. Right lower extremity with no evidence of drainage or ulceration,  persistent left lower extremity ulceration. He states that home health has not been out since his appointment. He went to [facility] on Tuesday, studies revealed: RIGHT ABI 0.9, TBI 0.6 LEFT ABI 1.1, TBI 0.6 with triphasic flow bilaterally. We will continue his same treatment plan. He has been educated on compression therapy and need for elevation. He will benefit from lymphedema pumps 11/01/17-He is here in follow-up evaluation for left lower extremity ulcer. The right lower extremity remains healed. He has home health services, but they have not been out to see the patient for 2-3 weeks. He states it home health physical therapy changed his dressing yesterday after therapy; he placed Ace wrap compression. We are still waiting for lymphedema pumps, reordered d/t need for company change. 11/08/17-He is here in follow-up evaluation for left lower extremity ulcer. It is improved. Edema is significantly improved with compression therapy. We will continue with same treatment plan and he will follow-up next week. No word regarding lymphedema pumps 11/15/17-He is here in follow-up evaluation for left  lower extremity ulcer. He is healed and will be discharged from wound care services. I have reached out to [facility] regarding his lymphedema pumps. They have been unable to reach the patient; the contact number they had with the patient's wife's cell phone and she has not answered any unrecognized calls. Contact should be made today, trial planned for next week; [facility] will continue to follow 11/27/17 on evaluation today patient has multiple blistered areas over the right lower extremity his left lower extremity appears to be doing okay. These blistered areas show signs of no infection which is great news. With that being said he did have some necrotic skin overlying which was mechanically debrided away with saline and gauze today without complication. Overall post debridement the  wounds appear to be doing better but in general his swelling seems to be increased. This is obviously not good news. I think this is what has given rise to the blisters. 12/04/17 on evaluation today patient presents for follow-up concerning his bilateral lower extremity edema in the right lower extremity ulcers. He has been tolerating the dressing changes without complication. With that being said he has had no real issues with the wraps which is also good news. Overall I'm pleased with the progress he's been making. 12/11/17 on evaluation today patient appears to be doing rather well in regard to his right lateral lower extremity ulcer. He's been tolerating the dressing changes without complication. Fortunately there does not appear to be any evidence of infection at this time. Overall I'm pleased with the progress that is being made. Unfortunately he has been in the hospital due to having what sounds to be a stomach virus/flu fortunately that is starting to get better. 12/18/17 on evaluation today patient actually appears to be doing very well in regard to his bilateral lower extremities the swelling is under fairly good control his lymphedema pumps are still not up and running quite yet. With that being said he does have several areas of opening noted as far as wounds are concerned mainly over the left lower extremity. With that being said I do believe once he gets lymphedema pumps this would least help you mention some the fluid and preventing this from occurring. Hopefully that will be set up soon sleeves are Artie in place at his home he just waiting for the machine. 12/25/17 on evaluation today patient actually appears to be doing excellent in fact all of his ulcers appear to have resolved his legs appear very well. I do think he needs compression stockings we have discussed this and they are actually going to go to [facility] today to elastic therapy to get this fitted for him. I think that is  definitely a good thing to do. Readmission: 04/09/18 upon evaluation today this patient is seen for readmission due to bilateral lower extremity lymphedema. He has significant swelling of his extremities especially on the left although the right is also swollen he has weeping from both sides. There are no obvious open wounds at this point. Fortunately he has been doing fairly well for quite a bit of time since I last saw him. Nonetheless unfortunately this seems to have reopened and is giving quite a bit of trouble. He states this began about a week ago when he first called [place] to get in to be seen. No fevers, chills, nausea, or vomiting noted at this time. He has not been using his lymphedema pumps due to the fact that they won't fit on his leg  at this point likewise is also not been using his compression for essentially the same reason. IZAN, MIRON ([id number]) 04/16/18 upon evaluation today patient actually appears to be doing a little better in regard to the fluid in his bilateral lower extremities. With that being said he's had three falls since I saw him last week. He also states that he's been feeling very poorly. I was concerned last week and feel like that the concern is still there as far as the congestion in his chest is concerned he seems to be breathing about the same as last week but again he states he's very weak he's not Adam Merritt able to walk further than from the chair to the door. His wife had to buy a wheelchair just to be able to get them out of the house to get to the appointment today. This has me very concerned. 04/23/18 on evaluation today patient actually appears to be doing much better than last week's evaluation. At that time actually had to transport him to the ER via EMS and he subsequently was admitted for acute pulmonary edema, acute renal failure, and acute congestive heart failure. Fortunately he is doing much better. Apparently they did dialyze him and were able to take  off roughly 35 pounds of fluid. Nonetheless he is feeling much better both in regard to his breathing and he's able to get around much better at this time compared to previous. Overall I'm very happy with how things are at this time. There does not appear to be any evidence of infection currently. No fevers, chills, nausea, or vomiting noted at this time. 04/30/2018 patient seen today for follow-up and management of bilateral lower extremity lymphedema. He did express being more sad today than usual due to the recent loss of his dog. He states that he has been compliant with using the lymphedema pumps. However he does admit a minute over the last 2-3 days he has not been using the pumps due to the recent loss of his dog. At this time there is no drainage or open wounds to his lower extremities. The left leg edema is measuring smaller today. Still has a significant amount of edema on bilateral lower extremities With dry flaky skin. He will be referred to the lymphedema clinic for further management. Will continue 3 layer compression wraps and follow-up in 1-2 weeks.Denies any pain, fever, chairs recently. No recent falls or injuries reported during this visit. 05/07/18 on evaluation today patient actually appears to be doing very well in regard to his lower extremities in general all things considered. With that being said he is having some pain in the legs just due to the amount of swelling. He does have an area where he had a blister on the left lateral lower extremity this is open at this point other than that there's nothing else weeping at this time. 05/14/18 on evaluation today patient actually appears to be doing excellent all things considered in regard to his lower extremities. He still has a couple areas of weeping on each leg which has continued to be the issue for him. He does have an appointment with the lymphedema clinic although this isn't until February 2020. That was the earliest they  had. In the meantime he has continued to tolerate the compression wraps without complication. 05/28/18 on evaluation today patient actually appears to be doing more poorly in regard to his left lower extremity where he has a wound open at this point. He also had a  fall where he subsequently injured his right great toe which has led to an open wound at the site unfortunately. He has been tolerating the dressing changes without complication in general as far as the wraps are concerned that he has not been putting any dressing on the left 1st toe ulcer site. 06/11/18 on evaluation today patient appears to be doing much worse in regard to his bilateral lower extremity ulcers. He has been tolerating the dressing changes without complication although his legs have not been wrapped more recently. Overall I am not very pleased with the way his legs appear. I do believe he needs to be back in compression wraps he still has not received his compression wraps from the [facility] hospital as of yet. 06/18/18 on evaluation today patient actually appears to be doing significantly better than last time I saw him. He has been tolerating the compression wraps without complication in the circumferential ulcers especially appear to be doing much better. His toe ulcer on the right in regard to the great toe is better although not as good as the legs in my pinion. No fevers chills noted 07/02/18 on evaluation today patient appears to be doing much better in regard to his lower extremity ulcers. Unfortunately since I last saw him he's had the distal portion of his right great toe if he dated it sounds as if this actually went downhill very quickly. I had only seen him a few days prior and the toe did not appear to be infected at that point subsequently became infected very rapidly and it was decided by the surgeon that the distal portion of the toe needed to be removed. The patient seems to be doing well in this regard he tells me.  With that being said his lower extremities are doing better from the standpoint of the wounds although he is significantly swollen at this point. 07/09/18 on evaluation today patient appears to be doing better in regard to the wounds on his lower extremities. In fact everything is almost completely healed he is just a small area on the left posterior lower extremity that is open at this point. 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 With that being said he needs some kind of compression it does sound like he may not be wearing his compression, that is the wraps, during the entire time between when he's here visit to visit. Apparently his wife took the current one off because it began to "fall apart". 07/16/18 on evaluation today patient appears to be extremely swollen especially in regard to his left lower extremity unfortunately. He also has a new skin tear over the left lower extremity and there's a smaller area on the right lower extremity as well. Unfortunately this seems to be due in part to blistering and fluid buildup in his leg. He did get the reduction wraps that were ordered by the [facility] hospital for him to go to lymphedema clinic. With that being said his wounds on the legs have not healed to the point to where they would likely accept them as a patient lymphedema clinic currently. We need to try to get this to heal. With that being said he's been taking his wraps off which is not doing him any favors at this point. In fact this is probably quite counterproductive compared to what  needs to occur. We will likely need to increase to a four layer compression wrap and continue to also utilize elevation and he has to keep the wraps on not take them off as he's been doing currently hasn't had a wrap on since  Saturday. 07/23/18 on evaluation today patient appears to be doing much better in regard to his bilateral lower extremities. In fact his left lower extremity which was the largest is actually 15 cm smaller today compared to what it was last time he was here in our clinic. This is obviously good news after just one week. Nonetheless the differences he actually kept the wraps on during the entire week this time. That's not typical for him. I do believe he understands a little bit better now the severity of the situation and why it's important for him to keep these wraps on. 07/30/18 on evaluation today patient actually appears to be doing rather well in regard to his lower extremities. His legs are much smaller than they have been in the past and he actually has only one very small rudder superficial region remaining that is not closed on the left lateral/posterior lower extremity Adam Merritt this is almost completely close. I do believe likely next week he will be healed without any complications. I do think we need to continue the wraps however this seems to be beneficial for him. I also think it may be a good time for [place] to go ahead and see about getting the appointment with the lymphedema clinic which is supposed to be made for him in order to keep this moving along and hopefully get them into compression wraps that will in the end help him to remain healed. Adam Merritt, Adam Merritt ([id number]) 08/06/18 on evaluation today patient actually appears to be doing very well in regard as bilateral lower extremities. In fact his wounds appear to be completely healed at this time. 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 He is in the process of getting appointment with the lymphedema clinic we have made this referral were just waiting to hear back on the schedule time. We need to follow up on that today as well. 08/13/18 on evaluation today patient actually appears  to be doing very well in regard to his bilateral lower extremities there are no open wounds at this point. We are gonna go ahead and see about ordering the Velcro compression wraps for him had a discussion with them about [place] doing it versus the [place] they feel like they can definitely afford going ahead and get the wraps themselves and they would prefer to try to avoid having to go to the lymphedema clinic if it all possible which I completely understand. As long as he has good compression I'm okay either way. 3/17//20 on evaluation today patient actually appears to be doing well in regard to his bilateral lower extremity ulcers. He has been tolerating the dressing changes without complication specifically the compression wraps. Overall is had no issues my fingers finding that I see at this point is that he is having trouble with constipation. He tells me he has not been able to go to the bathroom for about six days. He's taken to over-the-counter oral laxatives unfortunately this is not helping. He has not contacted his doctor. 08/27/18 on evaluation today patient appears to be doing fairly well in regard to his lower extremities at this point. There does not appear to be any new altars is swelling  is very well controlled. We are still waiting for his Velcro compression wraps to arrive that should be sometime in the next week is Artie sent the check for these. 09/03/18 on evaluation today patient appears to be doing excellent in regard to his bilateral lower extremities which he shows no signs of wound openings in regard to this point. He does have his Velcro compression wraps which did arrive in the mail since I saw him last week. Overall he is doing excellent in my pinion. 09/13/18 on evaluation today patient appears to be doing very well currently in regard to the overall appearance of his bilateral lower extremities although he's a little bit more swollen than last time we saw him. At that point he  been discharged without any open wounds. Nonetheless he has a small open wound on the posterior left lower extremity with some evidence of cellulitis noted as well. Fortunately I feel like he has made good progress overall with regard to his lower extremities from were things used to be. 09/20/18 on evaluation today patient actually appears to be doing much better. The erythematous lower extremity is improving wound itself which is still open appears to be doing much better as far as but appearance as well as pain is concerned overall very pleased in this regard. There's no signs of active infection at this time. 09/27/18 on evaluation today patient's wounds on the lower extremity actually appear to be doing fairly well at this time which is good news. There is no evidence of active infection currently and again is just as left lower extremity were there any wounds at this point anyway. I believe they may be completely healed but again I'm not 100% sure based on evaluation today. I think one more week of observation would likely be a good idea. 10/04/18 on evaluation today patient actually appears to be doing excellent in regard to the left lower extremity which actually appears to be completely healed as of today. Unfortunately he's been having issues with his right lower extremity have a new wound that has opened. Fortunately there's no evidence of active infection at this time which is good news. 10/10/18 upon evaluation today patient actually appears to be doing a little bit worse with the new open area on his right posterior lower extremity. He's been tolerating the dressing changes without complication. Right now we been using his compression wraps although I think we may need to switch back to actually performing bilateral compression wraps are in the clinic. No fevers, chills, nausea, or vomiting noted at this time. 10/17/18 on evaluation today patient actually appears to be doing quite well in  regard to his bilateral lower extremity ulcers. He has been tolerating the reps without complication although he would prefer not to rewrap his legs as of today. Fortunately there's no signs of active infection which is good news. No fevers, chills, nausea, or vomiting noted at this time. 10/24/18 on evaluation today patient appears to be doing very well in regard to his lower extremities. His right lower extremity is shown signs of healing and his left lower Trinity though not healed appears to be improving which is excellent news. Overall very pleased with how things seem to be progressing at this time. The patient is likewise happy to hear this. His Velcro compression wraps however have not been put on properly will gonna show his wife how to do that properly today. 10/31/18 on evaluation today patient appears to be doing more poorly in regard  to his left lower extremity in particular although both lower extremities actually are showing some signs of being worse than my previous evaluation. Unfortunately I'm just not sure that his compression stockings Adam Merritt with the use of the compression/lymphedema pumps seem to be controlling this well. Upon further questioning he tells me that he also is not able to lie flat in the bed due to his congestive heart failure and difficulty breathing. For that reason he sleeps in his recliner. To make matters worse his recliner also cannot Adam Merritt hold his legs up so instead of Adam Merritt being somewhat elevated they pretty much hang down to the floor. This is the way he sleeps each night which is definitely counterproductive to everything else that were attempting to do from the standpoint of controlling his fluid. Nonetheless I think that he potentially could benefit from a hospital bed although this would be something that his primary care provider would likely have to order since anything that is order on our side has to be directly related to wound care and again the  hospital bed is not necessarily a direct relation to although I think it does contribute to his overall wound status on his lower extremities. 11/07/18 on evaluation today patient appears to be doing better in regard to his bilateral lower extremity. He's been tolerating the dressing changes without complication. We did in the interim since I last saw him switch to just using extras orbiting new alginate and that seems to have done much better for him. I'm very pleased with the overall progress that is made. 11/14/18 on evaluation today patient appears to be redoing rather well in regard to his left lower extremity ulcers which are the only ones remaining at this point. Fortunately there's no signs of active infection at this time which is good news. Overall been very pleased with how things seem to be progressing currently. No fevers, chills, nausea, or vomiting noted at this time. Adam Merritt, Adam Merritt ([id number]) 11/20/17 on evaluation today patient actually appears to be doing quite well in regard to his lower extremities on the left on the right he has several blisters that showed up although there's some question about whether or not he has had his broker compression wraps on like he was supposed to or not. Fortunately there's no signs of active infection at this time which is good news. Unfortunately though he is doing well from the standpoint of the left leg the right leg is not doing as well again this is when we did not wrap last week. 11/28/18 on evaluation today patient appears to be doing well in regard to his bilateral lower extremities is left appears to be healed is right is not healed but is very close to being so. Overall very pleased with how things seem to be progressing. Patient is likewise happy that things are doing well. 12/09/18 on evaluation today patient actually appears to be completely healed which is excellent news. He actually seems to be doing well in regard to the swelling of the  bilateral lower extremities which is also great news. Overall very pleased with how things seem to be progressing. Readmission: 03/18/2019 upon evaluation today patient presents for reevaluation concerning issues that he is having with his left lower extremity where he does have a wound noted upon inspection today. He also has a wound on the right second toe on the tip where it is apparently been rubbing in his shoe I did look at issues and you can actually  see where this has been occurring as well. Fortunately there is no signs of infection with regard to the toe necessarily although it is very swollen compared to normal that does have me somewhat concerned about the possibility of further evaluation for infection/osteomyelitis. I would recommend an x-ray to start with. Otherwise he states that it is been 1-2 weeks that he has had the draining in regard to left lower extremity he does not know how this happened he has been wearing his compression appropriately and I do see that as well today but nonetheless I do think that he needs to continue to be very cautious with regard to elevation as well. 03/25/2019 on evaluation today patient appears to be doing much better in regard to his lower extremity at this time. That is on the left. He did culture positive for Pseudomonas but the good news is he seems to be doing much better the gentamicin we applied topically seems to have done a great job for him. There is no signs of active infection at this time systemically and locally he is doing much better. No fevers, chills, nausea, vomiting, or diarrhea. In regard to his right toe this seems to be doing much better there is very little pressure noted at this point I did clean up some of the callus today around the edges of the wound as well as the surface of the wound but to be honest he is progressing quite nicely. 10/30; the area on the left anterior lower tibia area is healed over. His edema control is  adequate Adam Merritt though his use of the compression pumps seems very intermittent. He has a juxta lite stocking on the right leg and a think there is 1 for the left leg at home. His wife is putting these on. He has an area on the plantar right second toe which is a hammertoe they are trying to offload this 04/08/2019 on evaluation today patient appears to be doing well in regard to his lower extremities which is good news. We are seeing him today for his toe ulcer which is giving him a little bit more trouble but again still seems to be doing better at this point which is good news. He is going require some sharp debridement in regard to the toe today however. 04/15/2019 on evaluation today patient actually appears to be doing quite well with regard to his toe ulcer. I am very pleased with how things are doing in that regard. With regard to his lower extremity edema in general he tells me he is not been using the lymphedema pumps regularly although he does not use them. I think that he needs to use them more regularly he does have a small blister on the left lower extremity this is not open at this point but obviously it something that can get worse if he does not keep the compression going and the pumps going as well. 04/22/2019 on evaluation today patient appears to be doing well with regard to his toe ulcer. He has been tolerating the dressing changes without complication. This is measuring slightly smaller this week as compared to last week. Fortunately there is no signs of active infection at this time. 05/06/2019 on evaluation today patient actually appears to be doing excellent. In fact his toe appears to be completely healed which is great news. There is no signs of active infection at this time. Readmission: Patient presents today for follow-up after having been discharged at the beginning of December 2020.  He states that in recent weeks things have reopened and has been having more trouble at  this point. With that being said fortunately there is no signs of active infection at this time. No fever chills noted. Nonetheless he also does have an issue with one of his toes as well that seems to be somewhat this is on the right foot second toe. 07/25/2019 upon evaluation today patient's lower extremities appear to be doing excellent bilaterally. I really feel like he is showing signs of great improvement and in fact I think he is close to healing which is great news. There is no signs of active infection at this time. No fevers, chills, nausea, vomiting, or diarrhea. 07/31/2019 upon evaluation today patient appears to be doing excellent and in fact I think may be completely healed in regard to his right lower extremity that were still to monitor this 1 more week before closing it out. The left lower extremity is measuring smaller although not completely closed seems to be doing excellent. 08/07/2019 upon evaluation today patient appears to be doing excellent in regard to his wounds. In fact the right lower extremity is completely healed there is no issues here. The left lower extremity has 1 very small area still remaining fortunately there is no signs of infection and overall I think he is very close to closure of the here as well. Adam Merritt, Adam Merritt ([id number]) Objective Constitutional Well-nourished and well-hydrated in no acute distress. Vitals Time Taken: 2:13 PM, Height: 66 in, Weight: 292 lbs, BMI: 47.1, Temperature: 98.5 F, Pulse: 69 bpm, Respiratory Rate: 20 breaths/min, Blood Pressure: 163/68 mmHg. Respiratory normal breathing without difficulty. Psychiatric this patient is able to make decisions and demonstrates good insight into disease process. Alert and Oriented x 3. pleasant and cooperative. General Notes: Patient's wound on the left lateral lower extremity is very tiny he is almost completely healed I would recommend 1 more week with the wrap on just to ensure that this  completely seals up and then we can discharge him healed. The patient is in agreement with that plan. Integumentary (Hair, Skin) Wound #26 status is Healed - Epithelialized. Original cause of wound was Gradually Appeared. The wound is located on the Right,Lateral Lower Leg. The wound measures 0cm length x 0cm width x 0cm depth; 0cm^2 area and 0cm^3 volume. Wound #27 status is Open. Original cause of wound was Gradually Appeared. The wound is located on the Left,Lateral Lower Leg. The wound measures 0.5cm length x 0.7cm width x 0.1cm depth; 0.275cm^2 area and 0.027cm^3 volume. There is Fat Layer (Subcutaneous Tissue) Exposed exposed. There is no tunneling or undermining noted. There is a medium amount of serous drainage noted. The wound margin is flat and intact. There is large (67-100%) pink granulation within the wound bed. There is no necrotic tissue within the wound bed. 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 There was a Four Layer Compression Therapy Procedure by Army Melia, RN. Post procedure Diagnosis Wound #27: Same as Pre-Procedure Plan Wound Cleansing: Wound #27 Left,Lateral Lower Leg: Cleanse wound with mild soap and  water - in office Primary Wound Dressing: Wound #27 Left,Lateral Lower Leg: Silver Alginate Adam Merritt, Adam Merritt ([id number]) Secondary Dressing: Wound #27 Left,Lateral Lower Leg: ABD pad Dressing Change Frequency: Wound #27 Left,Lateral Lower Leg: Change dressing every week Follow-up  Appointments: Wound #27 Left,Lateral Lower Leg: Return Appointment in 1 week. Edema Control: Wound #27 Left,Lateral Lower Leg: 4 Layer Compression System - Bilateral 1. I would recommend currently that we go ahead and continue with the use of his Velcro compression wrap for the right lower extremity the patient is in agreement with that plan. 2. For the left lower extremity we will go ahead and initiate treatment with the continuation of a 4-layer compression wrap with silver alginate to the one open area remaining. 3. With regard to elevation he still needs to elevate his legs much as possible and use lymphedema pumps if at all possible. We will see patient back for reevaluation in 1 week here in the clinic. If anything worsens or changes patient will contact our office for additional recommendations. Electronic Signature(s) Signed: 08/07/2019 2:35:02 PM By: Worthy Keeler PA-C Entered By: Worthy Keeler on 08/07/2019 14:35:02 Adam Merritt ([id number]) -------------------------------------------------------------------------------- SuperBill Details Patient Name: Adam Merritt Date of Service: 08/07/2019 Medical Record Number: [id number] Patient Account Number: [id number] Date of Birth/Sex: Feb 23, 1946 (73 y.o. 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 breakdown of skin E66.09 Other obesity due to excess calories Facility Procedures CPT4 Code:  60600459 Description: (Facility Use Only) 29581LT - [place] LWR LT LEG Modifier: Quantity: 1 Physician Procedures CPT4 Code: 9774142 Description: 99213 - WC PHYS LEVEL 3 - EST PT Modifier: Quantity: 1 CPT4 Code: Description: ICD-10 Diagnosis Description I89.0 Lymphedema, not elsewhere classified L97.512 Non-pressure chronic ulcer of other part of right foot with fat layer expos L97.822 Non-pressure chronic ulcer of other part of left lower leg with fat layer e  L95.320 Type 2 diabetes mellitus with foot ulcer Modifier: ed xposed Quantity: Electronic Signature(s) Signed: 08/07/2019 2:35:22 PM By: Worthy Keeler PA-C Entered By: Worthy Keeler on 08/07/2019 14:35:22

## 2019-08-07 NOTE — Progress Notes (Signed)
PLATON, AROCHO ([id number]) Visit Report for 08/07/2019 Arrival Information Details Patient Name: Adam Merritt, Adam Merritt Date of Service: 08/07/2019 2:00 PM Medical Record Number: [id number] Patient Account Number: [id number] Date of Birth/Sex: [birth year] (74 y.o. M) Treating RN: Montey Hora Primary Care Yailine Ballard: [name] Other Clinician: Referring Shakima Nisley: [name] Treating Neri Vieyra/Extender: Melburn Hake, [name]: 3 Visit Information History Since Last Visit Added or deleted any medications: No Patient Arrived: Walker Any new allergies or adverse reactions: No Arrival Time: 14:05 Had a fall or experienced change in No Accompanied By: wife activities of daily living that may affect Transfer Assistance: None risk of falls: Patient Identification Verified: Yes Signs or symptoms of abuse/neglect since last visito No Secondary Verification Process Completed: Yes Hospitalized since last visit: No Implantable device outside of the clinic excluding No cellular tissue based products placed in the center since last visit: Has Dressing in Place as Prescribed: Yes Has Compression in Place as Prescribed: Yes Pain Present Now: No Electronic Signature(s) Signed: 08/07/2019 4:34:11 PM By: Montey Hora Entered By: Montey Hora on 08/07/2019 14:06:28 [name] ([id number]) -------------------------------------------------------------------------------- Compression Therapy Details Patient Name: [name] Date of Service: 08/07/2019 2:00 PM Medical Record Number: [id number] Patient Account Number: [id number] Date of Birth/Sex: [birth year] (74 y.o. M) Treating RN: Army Melia Primary Care Gracee Ratterree: [name] Other Clinician: Referring Xoe Hoe: [name] Treating Yaqub Arney/Extender: Melburn Hake, [name]: 3 Compression Therapy Performed for Wound Assessment: Wound #27 Left,Lateral Lower Leg Performed By: Clinician Army Melia, RN Compression Type:  Four Layer Post Procedure Diagnosis Same as Pre-procedure Electronic Signature(s) Signed: 08/07/2019 3:26:37 PM By: Army Melia Entered By: Army Melia on 08/07/2019 14:31:32 [name] ([id number]) -------------------------------------------------------------------------------- Encounter Discharge Information Details Patient Name: [name] Date of Service: 08/07/2019 2:00 PM Medical Record Number: [id number] Patient Account Number: [id number] Date of Birth/Sex: August 29, 1945 (74 y.o. M) Treating RN: Army Melia Primary Care Anessa Charley: [name] Other Clinician: Referring Deandra Gadson: [name] Treating Yuvonne Lanahan/Extender: Melburn Hake, [name]: 3 Encounter Discharge Information Items Discharge Condition: Stable Ambulatory Status: Walker Discharge Destination: Home Transportation: Private Auto Accompanied By: wife Schedule Follow-up Appointment: Yes Clinical Summary of Care: Electronic Signature(s) Signed: 08/07/2019 3:26:37 PM By: Army Melia Entered By: Army Melia on 08/07/2019 14:32:57 [name] ([id number]) -------------------------------------------------------------------------------- Lower Extremity Assessment Details Patient Name: [name] Date of Service: 08/07/2019 2:00 PM Medical Record Number: [id number] Patient Account Number: [id number] Date of Birth/Sex: June 21, 1945 (74 y.o. M) Treating RN: Montey Hora Primary Care Shiraz Bastyr: [name] Other Clinician: Referring Doretha Goding: [name] Treating Geneieve Duell/Extender: Melburn Hake, [name]: 3 Edema Assessment Assessed: [Left: No] [Right: No] Edema: [Left: Yes] [Right: Yes] Calf Left: Right: Point of Measurement: 34 cm From Medial Instep 53 cm 48 cm Ankle Left: Right: Point of Measurement: 12 cm From Medial Instep 36 cm 28.5 cm Vascular Assessment Pulses: Dorsalis Pedis Palpable: [Left:Yes] [Right:Yes] Electronic Signature(s) Signed: 08/07/2019 4:34:11 PM By:  Montey Hora Entered By: Montey Hora on 08/07/2019 [place], Aaro W. ([id number]) -------------------------------------------------------------------------------- Multi Wound Chart Details Patient Name: [name] Date of Service: 08/07/2019 2:00 PM Medical Record Number: [id number] Patient Account Number: [id number] Date of Birth/Sex: 11-07-1945 (74 y.o. M) Treating RN: Army Melia Primary Care Mykeal Carrick: [name] Other Clinician: Referring Lalani Winkles: [name] Treating Amiliah Campisi/Extender: Melburn Hake, [name]: 3 Vital Signs Height(in): 66 Pulse(bpm): 69 Weight(lbs): 292 Blood Pressure(mmHg): 163/68 Body Mass Index(BMI): 47 Temperature(F): 98.5 Respiratory Rate(breaths/min): 20 Photos: [N/A:N/A] Wound Location: Right,  Lateral Lower Leg Left Lower Leg - Lateral N/A Wounding Event: Gradually Appeared Gradually Appeared N/A Primary Etiology: Lymphedema Lymphedema N/A Comorbid History: N/A Anemia, Lymphedema, Chronic N/A Obstructive Pulmonary Disease (COPD), Sleep Apnea, Congestive Heart Failure, Coronary Artery Disease, Hypertension, Peripheral Venous Disease, Type II Diabetes, Osteoarthritis, Neuropathy, Received Chemotherapy Date Acquired: 07/07/2019 07/07/2019 N/A Weeks of Treatment: 3 3 N/A Wound Status: Healed - Epithelialized Open N/A Measurements L x W x D (cm) 0x0x0 0.5x0.7x0.1 N/A Area (cm) : 0 0.275 N/A Volume (cm) : 0 0.027 N/A % Reduction in Area: 100.00% 93.30% N/A % Reduction in Volume: 100.00% 93.40% N/A Classification: Full Thickness Without Exposed Full Thickness Without Exposed N/A Support Structures Support Structures Exudate Amount: N/A Medium N/A Exudate Type: N/A Serous N/A Exudate Color: N/A amber N/A Wound Margin: N/A Flat and Intact N/A Granulation Amount: N/A Large (67-100%) N/A Granulation Quality: N/A Pink N/A Necrotic Amount: N/A None Present (0%) N/A Epithelialization: N/A Large (67-100%) N/A Treatment  Notes Electronic Signature(s) Signed: 08/07/2019 3:26:37 PM By: Army Melia Entered By: Army Melia on 08/07/2019 14:30:50 [name] ([id number]) RIKKI, SMESTAD ([id number]) -------------------------------------------------------------------------------- [place] Details Patient Name: [name] Date of Service: 08/07/2019 2:00 PM Medical Record Number: [id number] Patient Account Number: [id number] Date of Birth/Sex: 05-05-1946 (74 y.o. M) Treating RN: Army Melia Primary Care Glenice Ciccone: [name] Other Clinician: Referring Lielle Vandervort: [name] Treating Vi Whitesel/Extender: Melburn Hake, [name]: 3 Active Inactive Abuse / Safety / Falls / Self Care Management Nursing Diagnoses: History of Falls Potential for falls Goals: Patient/caregiver will demonstrate safe use of adaptive devices to increase mobility Date Initiated: 07/17/2019 Target Resolution Date: 08/15/2019 Goal Status: Active Interventions: Provide education on fall prevention Notes: Orientation to the Wound Care Program Nursing Diagnoses: Knowledge deficit related to the wound healing center program Goals: Patient/caregiver will verbalize understanding of the [place] Program Date Initiated: 07/17/2019 Target Resolution Date: 08/15/2019 Goal Status: Active Interventions: Provide education on orientation to the wound center Notes: Wound/Skin Impairment Nursing Diagnoses: Impaired tissue integrity Goals: Ulcer/skin breakdown will have a volume reduction of 30% by week 4 Date Initiated: 07/17/2019 Target Resolution Date: 08/15/2019 Goal Status: Active Interventions: Assess ulceration(s) every visit Notes: Electronic Signature(s) Signed: 08/07/2019 3:26:37 PM By: Army Melia Entered By: Army Melia on 08/07/2019 14:30:30 [name] ([id number]) DERRIL, FRANEK  ([id number]) -------------------------------------------------------------------------------- Pain Assessment Details Patient Name: [name] Date of Service: 08/07/2019 2:00 PM Medical Record Number: [id number] Patient Account Number: [id number] Date of Birth/Sex: 1945-11-27 (74 y.o. M) Treating RN: Montey Hora Primary Care Doriann Zuch: [name] Other Clinician: Referring Safira Proffit: [name] Treating Mischa Pollard/Extender: Melburn Hake, [name]: 3 Active Problems Location of Pain Severity and Description of Pain Patient Has Paino No Site Locations Pain Management and Medication Current Pain Management: Electronic Signature(s) Signed: 08/07/2019 4:34:11 PM By: Montey Hora Entered By: Montey Hora on 08/07/2019 14:13:47 [name] ([id number]) -------------------------------------------------------------------------------- Patient/Caregiver Education Details Patient Name: [name] Date of Service: 08/07/2019 2:00 PM Medical Record Number: [id number] Patient Account Number: [id number] Date of Birth/Gender: [birth year] (74 y.o. M) Treating RN: Army Melia Primary Care Physician: [name] Other Clinician: Referring Physician: [name] Treating Physician/Extender: Sharalyn Ink in Treatment: 3 Education Assessment Education Provided To: Patient Education Topics Provided Wound/Skin Impairment: Handouts: Caring for Your Ulcer Methods: Demonstration, Explain/Verbal Responses: State content correctly Electronic Signature(s) Signed: 08/07/2019 3:26:37 PM By: Army Melia Entered By: Army Melia on 08/07/2019 14:32:22 [name] ([id number]) -------------------------------------------------------------------------------- Wound Assessment  Details Patient Name: DEMAREE, LIBERTO Date of Service: 08/07/2019 2:00 PM Medical Record Number: [id number] Patient Account Number: [id number] Date of Birth/Sex: [birth year] (74 y.o. M) Treating RN: Montey Hora Primary Care Lizvette Lightsey: [name] Other Clinician: Referring Chayce Robbins: [name] Treating Estellar Cadena/Extender: STONE III, [name]: 3 Wound Status Wound Number: 26 Primary Etiology: Lymphedema Wound Location: Right, Lateral Lower Leg Wound Status: Healed - Epithelialized Wounding Event: Gradually Appeared Date Acquired: 07/07/2019 Weeks Of Treatment: 3 Clustered Wound: No Photos Photo Uploaded By: Montey Hora on 08/07/2019 14:22:47 Wound Measurements Length: (cm) Width: (cm) Depth: (cm) Area: (cm) Volume: (cm) 0 % Reduction in Area: 100% 0 % Reduction in Volume: 100% 0 0 0 Wound Description Classification: Full Thickness Without Exposed Support Structu res Electronic Signature(s) Signed: 08/07/2019 4:34:11 PM By: Montey Hora Entered By: Montey Hora on 08/07/2019 14:20:09 [name] ([id number]) -------------------------------------------------------------------------------- Wound Assessment Details Patient Name: [name] Date of Service: 08/07/2019 2:00 PM Medical Record Number: [id number] Patient Account Number: [id number] Date of Birth/Sex: Feb 14, 1946 (74 y.o. M) Treating RN: Montey Hora Primary Care Noheli Melder: [name] Other Clinician: Referring Nykiah Ma: [name] Treating Shaena Parkerson/Extender: Melburn Hake, [name]: 3 Wound Status Wound Number: 27 Primary Lymphedema Etiology: Wound Location: Left Lower Leg - Lateral Wound Open Wounding Event: Gradually Appeared Status: Date Acquired: 07/07/2019 Comorbid Anemia, Lymphedema, Chronic Obstructive Pulmonary Weeks Of Treatment: 3 History: Disease (COPD), Sleep Apnea, Congestive Heart Failure, Clustered Wound: No Coronary Artery Disease, Hypertension, Peripheral Venous Disease, Type II Diabetes, Osteoarthritis, Neuropathy, Received Chemotherapy Photos Wound Measurements Length: (cm) 0.5 Width: (cm) 0.7 Depth: (cm) 0.1 Area: (cm) 0.275 Volume: (cm)  0.027 % Reduction in Area: 93.3% % Reduction in Volume: 93.4% Epithelialization: Large (67-100%) Tunneling: No Undermining: No Wound Description Classification: Full Thickness Without Exposed Support Stru Wound Margin: Flat and Intact Exudate Amount: Medium Exudate Type: Serous Exudate Color: amber ctures Foul Odor After Cleansing: No Slough/Fibrino No Wound Bed Granulation Amount: Large (67-100%) Exposed Structure Granulation Quality: Pink Fascia Exposed: No Necrotic Amount: None Present (0%) Fat Layer (Subcutaneous Tissue) Exposed: Yes Tendon Exposed: No Muscle Exposed: No Joint Exposed: No Bone Exposed: No Treatment Notes Wound #27 (Left, Lateral Lower Leg) Notes silver cell to left leg, ABD 4 layer left AASHISH, HAMM ([id number]) Electronic Signature(s) Signed: 08/07/2019 4:34:11 PM By: Montey Hora Entered By: Montey Hora on 08/07/2019 14:21:01 [name] ([id number]) -------------------------------------------------------------------------------- Vitals Details Patient Name: [name] Date of Service: 08/07/2019 2:00 PM Medical Record Number: [id number] Patient Account Number: [id number] Date of Birth/Sex: 10/27/1945 (74 y.o. M) Treating RN: Montey Hora Primary Care Vartan Kerins: [name] Other Clinician: Referring Merlon Alcorta: [name] Treating Patrina Andreas/Extender: Melburn Hake, [name]: 3 Vital Signs Time Taken: 14:13 Temperature (F): 98.5 Height (in): 66 Pulse (bpm): 69 Weight (lbs): 292 Respiratory Rate (breaths/min): 20 Body Mass Index (BMI): 47.1 Blood Pressure (mmHg): 163/68 Reference Range: 80 - 120 mg / dl Electronic Signature(s) Signed: 08/07/2019 4:34:11 PM By: Montey Hora Entered By: Montey Hora on 08/07/2019 14:13:40

## 2019-08-08 ENCOUNTER — Telehealth: Payer: Self-pay

## 2019-08-08 NOTE — Telephone Encounter (Signed)
Confirmed appointment on 08/12/2019 and screened for covid. [facility]

## 2019-08-12 ENCOUNTER — Ambulatory Visit (INDEPENDENT_AMBULATORY_CARE_PROVIDER_SITE_OTHER): Payer: Medicare PPO | Admitting: Internal Medicine

## 2019-08-12 ENCOUNTER — Encounter: Payer: Self-pay | Admitting: Internal Medicine

## 2019-08-12 ENCOUNTER — Other Ambulatory Visit: Payer: Self-pay

## 2019-08-12 VITALS — BP 143/77 | HR 72 | Temp 97.6°F | Resp 16 | Ht 66.0 in | Wt 315.0 lb

## 2019-08-12 DIAGNOSIS — I872 Venous insufficiency (chronic) (peripheral): Secondary | ICD-10-CM

## 2019-08-12 DIAGNOSIS — R0602 Shortness of breath: Secondary | ICD-10-CM | POA: Diagnosis not present

## 2019-08-12 DIAGNOSIS — R600 Localized edema: Secondary | ICD-10-CM

## 2019-08-12 DIAGNOSIS — J449 Chronic obstructive pulmonary disease, unspecified: Secondary | ICD-10-CM | POA: Diagnosis not present

## 2019-08-12 DIAGNOSIS — R609 Edema, unspecified: Secondary | ICD-10-CM

## 2019-08-12 DIAGNOSIS — G4734 Idiopathic sleep related nonobstructive alveolar hypoventilation: Secondary | ICD-10-CM | POA: Diagnosis not present

## 2019-08-12 DIAGNOSIS — I700 Atherosclerosis of aorta: Secondary | ICD-10-CM

## 2019-08-12 DIAGNOSIS — I10 Essential (primary) hypertension: Secondary | ICD-10-CM | POA: Diagnosis not present

## 2019-08-12 DIAGNOSIS — G4733 Obstructive sleep apnea (adult) (pediatric): Secondary | ICD-10-CM | POA: Diagnosis not present

## 2019-08-12 DIAGNOSIS — Z6841 Body Mass Index (BMI) 40.0 and over, adult: Secondary | ICD-10-CM

## 2019-08-12 NOTE — Progress Notes (Signed)
[facility] [place], [place] [address]  Pulmonary Sleep Medicine   Office Visit Note  Patient Name: Adam Merritt DOB: 15-Apr-1946 MRN [id number]  Date of Service: 08/12/2019  Complaints/HPI: Pt is here for pulmonary follow up.  Overall he is doing well. He has a history of osa but is unable to tolerated cpap.  He continues to use oxygen at night, however it has been a few weeks since he has used it.  I encouraged him to start using it again.  He denies any overt SOB.  He continues to use his medications as directed.   ROS  General: (-) fever, (-) chills, (-) night sweats, (-) weakness Skin: (-) rashes, (-) itching,. Eyes: (-) visual changes, (-) redness, (-) itching. Nose and Sinuses: (-) nasal stuffiness or itchiness, (-) postnasal drip, (-) nosebleeds, (-) sinus trouble. Mouth and Throat: (-) sore throat, (-) hoarseness. Neck: (-) swollen glands, (-) enlarged thyroid, (-) neck pain. Respiratory: - cough, (-) bloody sputum, - shortness of breath, - wheezing. Cardiovascular: - ankle swelling, (-) chest pain. Lymphatic: (-) lymph node enlargement. Neurologic: (-) numbness, (-) tingling. Psychiatric: (-) anxiety, (-) depression   Current Medication: Outpatient Encounter Medications as of 08/12/2019  Medication Sig  . albuterol (PROVENTIL HFA;VENTOLIN HFA) 108 (90 Base) MCG/ACT inhaler INHALE 1 PUFF INTO THE LUNGS EVERY 6 HOURS AS NEEDED FOR WHEEZING OR SHORTNESS OF BREATH (Patient taking differently: Inhale 1 puff into the lungs every 6 (six) hours as needed for wheezing or shortness of breath. )  . aspirin 325 MG EC tablet Take 325 mg by mouth daily.  Marland Kitchen atorvastatin (LIPITOR) 40 MG tablet Take 40 mg by mouth at bedtime.   . carvedilol (COREG) 25 MG tablet Take 25 mg by mouth 2 (two) times daily with a meal.  . cefUROXime (CEFTIN) 500 MG tablet Take 1 tablet (500 mg total) by mouth 2 (two) times daily with a meal.  . docusate sodium (COLACE) 50 MG capsule  Take 50 mg by mouth 2 (two) times daily.  . febuxostat (ULORIC) 40 MG tablet Take 1 tablet (40 mg total) by mouth daily.  . furosemide (LASIX) 40 MG tablet Take 1 tablet (40 mg total) by mouth daily.  Marland Kitchen gabapentin (NEURONTIN) 300 MG capsule Take 2 cap in the morning, 1 cap in the evening and 2 cap at night.  . hydrALAZINE (APRESOLINE) 25 MG tablet   . indapamide (LOZOL) 2.5 MG tablet Take 1 tablet (2.5 mg total) by mouth daily.  . Insulin Glargine (BASAGLAR KWIKPEN) 100 UNIT/ML SOPN INJECT 30 TO 36 UNITS UNDER THE SKIN EVERY DAY  . Insulin Pen Needle (BD PEN NEEDLE NANO U/F) 32G X 4 MM MISC Use as directed with insulin DX e11.65  . ipratropium-albuterol (DUONEB) 0.5-2.5 (3) MG/3ML SOLN Take 3 mLs by nebulization every 4 (four) hours as needed (for shortness of breath).  . metolazone (ZAROXOLYN) 2.5 MG tablet   . Multiple Vitamin (MULTIVITAMIN WITH MINERALS) TABS tablet Take 1 tablet by mouth daily.  . ondansetron (ZOFRAN) 4 MG tablet Take 1 tablet (4 mg total) by mouth every 8 (eight) hours as needed.  [name] 75Z MISC Use twice daily diag e11.65  . pentoxifylline (TRENTAL) 400 MG CR tablet TAKE 1 TABLET BY MOUTH THREE TIMES DAILY FOR CLAUDICATION  . potassium chloride SA (KLOR-CON) 20 MEQ tablet Take 1 tablet (20 mEq total) by mouth 2 (two) times daily.  . prazosin (MINIPRESS) 2 MG capsule Take 2 mg by mouth  at bedtime.   . sennosides-docusate sodium (SENOKOT-S) 8.6-50 MG tablet Take 1-2 tablets by mouth 2 (two) times daily.  . sertraline (ZOLOFT) 100 MG tablet   . traZODone (DESYREL) 50 MG tablet Take 50 mg by mouth at bedtime.  Marland Kitchen VICTOZA 18 MG/3ML SOPN INJECT 1.8MG  INTO THE SKIN DAILY   No facility-administered encounter medications on file as of 08/12/2019.    Surgical History: Past Surgical History:  Procedure Laterality Date  . AMPUTATION TOE Right 06/23/2018   Procedure: AMPUTATION TOE;  Surgeon: Sharlotte Alamo, DPM;  Location: [facility] ORS;  Service: Podiatry;  Laterality:  Right;  . CHOLECYSTECTOMY    . CORONARY ARTERY BYPASS GRAFT      Medical History: Past Medical History:  Diagnosis Date  . Anemia   . CAD (coronary artery disease)   . Chest pain   . Coronary artery disease   . Diabetes mellitus without complication ([place])   . Diastolic CHF ([place])   . Esophageal reflux   . Herpes zoster without mention of complication   . Hyperlipidemia   . Hypertension   . Insomnia   . Lumbago   . Lymphedema   . Neuropathy in diabetes ([place])   . Obstructive chronic bronchitis without exacerbation ([place])   . Other malaise and fatigue   . Stroke ([place])   . Varicose veins     Family History: Family History  Problem Relation Age of Onset  . Diabetes Mother   . Hyperlipidemia Mother   . Hypertension Mother   . Diabetes Father   . Hyperlipidemia Father   . Hypertension Father   . CAD Father     Social History: Social History   Socioeconomic History  . Marital status: Married    Spouse name: Not on file  . Number of children: Not on file  . Years of education: Not on file  . Highest education level: Not on file  Occupational History  . Occupation: retired  Tobacco Use  . Smoking status: Never Smoker  . Smokeless tobacco: Never Used  Substance and Sexual Activity  . Alcohol use: No    Comment: quit alcohol 30 years ago  . Drug use: No  . Sexual activity: Not Currently  Other Topics Concern  . Not on file  Social History Narrative   Lives at home with wife, uses a wheelchair now   Social Determinants of Health   Financial Resource Strain: Low Risk   . Difficulty of Paying Living Expenses: Not hard at all  Food Insecurity: No Food Insecurity  . Worried About Charity fundraiser in the Last Year: Never true  . Ran Out of Food in the Last Year: Never true  Transportation Needs: No Transportation Needs  . Lack of Transportation (Medical): No  . Lack of Transportation (Non-Medical): No  Physical Activity: Inactive  . Days of Exercise per Week:  0 days  . Minutes of Exercise per Session: 0 min  Stress: No Stress Concern Present  . Feeling of Stress : Only a little  Social Connections: Slightly Isolated  . Frequency of Communication with Friends and Family: More than three times a week  . Frequency of Social Gatherings with Friends and Family: More than three times a week  . Attends Religious Services: More than 4 times per year  . Active Member of Clubs or Organizations: No  . Attends Archivist Meetings: Never  . Marital Status: Married  Human resources officer Violence: Not At Risk  . Fear of Current or  Ex-Partner: No  . Emotionally Abused: No  . Physically Abused: No  . Sexually Abused: No    Vital Signs: Blood pressure (!) 143/77, pulse 72, temperature 97.6 F (36.4 C), resp. rate 16, height 5\' 6"  (1.676 m), weight (!) 315 lb (142.9 kg), SpO2 96 %.  Examination: General Appearance: The patient is well-developed, well-nourished, and in no distress. Skin: Gross inspection of skin unremarkable. Head: normocephalic, no gross deformities. Eyes: no gross deformities noted. ENT: ears appear grossly normal no exudates. Neck: Supple. No thyromegaly. No LAD. Respiratory: clear bilaterally. Cardiovascular: Normal S1 and S2 without murmur or rub. Extremities: No cyanosis. pulses are equal. Neurologic: Alert and oriented. No involuntary movements.  LABS: No results found for this or any previous visit (from the past 2160 hour(s)).  Radiology: No results found.  No results found.  No results found.    Assessment and Plan: Patient Active Problem List   Diagnosis Date Noted  . Type 2 diabetes mellitus with hyperglycemia ([place]) 03/19/2019  . Aplastic anemia, unspecified ([place]) 03/19/2019  . Acute pain of right knee 02/23/2019  . Uncontrolled type 2 diabetes mellitus with hyperglycemia ([place]) 09/17/2018  . Primary insomnia 08/07/2018  . Bacteremia due to group B Streptococcus 07/02/2018  . COPD (chronic obstructive  pulmonary disease) ([place]) 05/13/2018  . Dysuria 05/05/2018  . Encounter for general adult medical examination with abnormal findings 05/05/2018  . CHF exacerbation ([place]) 04/16/2018  . Achilles tendon disorder, right 04/02/2018  . Essential hypertension 01/28/2018  . Lymphedema of both lower extremities 01/28/2018  . Cellulitis and abscess of lower extremity 09/20/2017  . Edema of both lower legs due to peripheral venous insufficiency 09/20/2017  . Cellulitis 09/20/2017  . Atopic dermatitis 09/02/2017  . Chronic pain disorder 09/02/2017  . CVA (cerebral vascular accident) ([place]) 09/15/2016  . 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 CVA (cerebral infarction) 02/12/2015  . Chronic diastolic CHF (congestive heart failure), NYHA class 3 ([place]) 11/30/2014  . Benign essential hypertension 11/06/2014  . CAD (coronary artery disease) of artery bypass graft 01/19/2014  . CKD (chronic kidney disease), stage III 01/19/2014  . Acute bronchitis with COPD ([place]) 01/19/2014  . Diabetic neuropathy ([place]) 01/19/2014  . Insulin dependent diabetes mellitus 01/19/2014  . OSA (obstructive sleep apnea) 01/19/2014  . Mixed hyperlipidemia 10/29/2013  . Peripheral vascular disease, unspecified ([place]) 09/29/2013    1. Chronic obstructive pulmonary disease, unspecified COPD type (HCC) Severe disease, continue to use oxygen at night, and use inhalers/nebulizer as directed.   2. Mild intermittent asthma with exacerbation Controlled continue to use inhalers.  FEV1 WNL.   3. Essential (primary) hypertension Controlled, continue present management.   4. Nocturnal hypoxia Continue to wear oxygen at night, 2 lPM.  5. BMI 50-59.9 adult ([place]) Pt is severely overweight, and has significant mobility deficit.    6. Morbid obesity ([place]) Obesity Counseling: Risk Assessment: An assessment of behavioral risk factors was made today and includes lack of exercise sedentary lifestyle, lack of portion control and poor  dietary habits.  Risk Modification Advice: She was counseled on portion control guidelines. Restricting daily caloric intake to 1800. The detrimental long term effects of obesity on her health and ongoing poor compliance was also discussed with the patient.  7. Edema of both lower legs due to peripheral venous insufficiency Stable, continue to see vascular for treatment and follow their recommendations.   8. OSA (obstructive sleep apnea) Noncompliant with cpap.  Unable to tolerate. Wears O2 at night.   9. Aortic atherosclerosis ([place]) ON  CT 09/15/2016  General Counseling: I have discussed the findings of the evaluation and examination with Read.  I have also discussed any further diagnostic evaluation thatmay be needed or ordered today. Vimal verbalizes understanding of the findings of todays visit. We also reviewed his medications today and discussed drug interactions and side effects including but not limited excessive drowsiness and altered mental states. We also discussed that there is always a risk not just to him but also people around him. he has been encouraged to call the office with any questions or concerns that should arise related to todays visit.  Orders Placed This Encounter  Procedures  . Spirometry with Graph    Order Specific Question:   Where should this test be performed?    Answer:   [facility]     Time spent: 30 This patient was seen by Orson Gear AGNP-C in Collaboration with Dr. Devona Konig as a part of collaborative care agreement.   I have personally obtained a history, examined the patient, evaluated laboratory and imaging results, formulated the assessment and plan and placed orders.    Allyne Gee, MD [facility] Pulmonary and Critical Care Sleep medicine

## 2019-08-13 ENCOUNTER — Other Ambulatory Visit: Payer: Self-pay

## 2019-08-13 DIAGNOSIS — E1142 Type 2 diabetes mellitus with diabetic polyneuropathy: Secondary | ICD-10-CM

## 2019-08-13 DIAGNOSIS — I872 Venous insufficiency (chronic) (peripheral): Secondary | ICD-10-CM

## 2019-08-13 DIAGNOSIS — I890 Lymphedema, not elsewhere classified: Secondary | ICD-10-CM

## 2019-08-13 MED ORDER — GABAPENTIN 300 MG PO CAPS: ORAL_CAPSULE | ORAL | 3 refills | Status: DC

## 2019-08-13 MED ORDER — PENTOXIFYLLINE ER 400 MG PO TBCR: EXTENDED_RELEASE_TABLET | ORAL | 3 refills | Status: DC

## 2019-08-13 MED ORDER — FUROSEMIDE 40 MG PO TABS: 40.0000 mg | ORAL_TABLET | Freq: Every day | ORAL | 1 refills | Status: DC

## 2019-08-14 ENCOUNTER — Encounter: Payer: Medicare PPO | Admitting: Physician Assistant

## 2019-08-14 ENCOUNTER — Other Ambulatory Visit: Payer: Self-pay

## 2019-08-14 DIAGNOSIS — L97512 Non-pressure chronic ulcer of other part of right foot with fat layer exposed: Secondary | ICD-10-CM | POA: Diagnosis not present

## 2019-08-14 DIAGNOSIS — E11621 Type 2 diabetes mellitus with foot ulcer: Secondary | ICD-10-CM | POA: Diagnosis not present

## 2019-08-14 DIAGNOSIS — E1151 Type 2 diabetes mellitus with diabetic peripheral angiopathy without gangrene: Secondary | ICD-10-CM | POA: Diagnosis not present

## 2019-08-14 DIAGNOSIS — I872 Venous insufficiency (chronic) (peripheral): Secondary | ICD-10-CM | POA: Diagnosis not present

## 2019-08-14 DIAGNOSIS — L97822 Non-pressure chronic ulcer of other part of left lower leg with fat layer exposed: Secondary | ICD-10-CM | POA: Diagnosis not present

## 2019-08-14 DIAGNOSIS — S99921A Unspecified injury of right foot, initial encounter: Secondary | ICD-10-CM | POA: Diagnosis not present

## 2019-08-14 DIAGNOSIS — I890 Lymphedema, not elsewhere classified: Secondary | ICD-10-CM | POA: Diagnosis not present

## 2019-08-14 DIAGNOSIS — I110 Hypertensive heart disease with heart failure: Secondary | ICD-10-CM | POA: Diagnosis not present

## 2019-08-14 DIAGNOSIS — E1140 Type 2 diabetes mellitus with diabetic neuropathy, unspecified: Secondary | ICD-10-CM | POA: Diagnosis not present

## 2019-08-14 DIAGNOSIS — I509 Heart failure, unspecified: Secondary | ICD-10-CM | POA: Diagnosis not present

## 2019-08-14 NOTE — Progress Notes (Addendum)
Adam Merritt, Adam Merritt ([id number]) Visit Report for 08/14/2019 Chief Complaint Document Details Patient Name: Adam Merritt Date of Service: 08/14/2019 1:30 PM Medical Record Number: [id number] Patient Account Number: [id number] Date of Birth/Sex: 09-13-1945 (73 y.o. M) Treating RN: Adam Merritt Primary Care Provider: Clayborn Merritt Other Clinician: Referring Provider: Clayborn Merritt Treating Provider/Extender: Adam Merritt, Adam Merritt: 4 Information Obtained from: Patient Chief Complaint Bilateral LE Ulcers and right second toe ulcer Electronic Signature(s) Signed: 08/14/2019 1:51:46 PM By: Adam Keeler PA-C Entered By: Adam Merritt on 08/14/2019 13:51:46 Adam Merritt ([id number]) -------------------------------------------------------------------------------- HPI Details Patient Name: Adam Merritt Date of Service: 08/14/2019 1:30 PM Medical Record Number: [id number] Patient Account Number: [id number] Date of Birth/Sex: May 24, 1946 (73 y.o. M) Treating RN: Adam Merritt Primary Care Provider: Clayborn Merritt Other Clinician: Referring Provider: Clayborn Merritt Treating Provider/Extender: Adam Merritt, Adam Merritt: 4 History of Present Illness HPI Description: 10/18/17-He is here for initial evaluation of bilateral lower extremity ulcerations in the presence of venous insufficiency and lymphedema. He has been seen by [facility] in the past, Dr. Lucky Merritt, last seen in 2016. He does have a history of abnormal ABIs, which is to be expected given his lymphedema and venous insufficiency. According to Epic, it appears that all attempts for arterial evaluation and/or angiography were not follow through with by patient. He does have a history of being seen in lymphedema clinic in 2018, stopped going approximately 6 months ago stating "it didn't do any good". He does not have lymphedema pumps, he does not have custom fit compression wrap/stockings. He is diabetic and his recent A1c last  month was 7.6. He admits to chronic bilateral lower extremity pain, no change in pain since blister and ulceration development. He is currently being treated with Levaquin for bronchitis. He has home health and we will continue. 10/25/17-He is here in follow-up evaluation for bilateral lower extremity ulcerationssubtle he remains on Levaquin for bronchitis. Right lower extremity with no evidence of drainage or ulceration, persistent left lower extremity ulceration. He states that home health has not been out since his appointment. He went to [facility] on Tuesday, studies revealed: RIGHT ABI 0.9, TBI 0.6 LEFT ABI 1.1, TBI 0.6 with triphasic flow bilaterally. We will continue his same [facility] plan. He has been educated on compression therapy and need for elevation. He will benefit from lymphedema pumps 11/01/17-He is here in follow-up evaluation for left lower extremity ulcer. The right lower extremity remains healed. He has home health services, but they have not been out to see the patient for 2-3 weeks. He states it home health physical therapy changed his dressing yesterday after therapy; he placed Ace wrap compression. We are still waiting for lymphedema pumps, reordered d/t need for company change. 11/08/17-He is here in follow-up evaluation for left lower extremity ulcer. It is improved. Edema is significantly improved with compression therapy. We will continue with same [facility] plan and he will follow-up next week. No word regarding lymphedema pumps 11/15/17-He is here in follow-up evaluation for left lower extremity ulcer. He is healed and will be discharged from wound care services. I have reached out to [facility] regarding his lymphedema pumps. They have been unable to reach the patient; the contact number they had with the patient's wife's cell phone and she has not answered any unrecognized calls. Contact should be made today, trial planned for next week; [facility] will continue to follow 11/27/17 on evaluation today patient has multiple blistered  areas over the right lower extremity his left lower extremity appears to be doing okay. These blistered areas show signs of no infection which is great news. With that being said he did have some necrotic skin overlying which was mechanically debrided away with saline and gauze today without complication. Overall post debridement the wounds appear to be doing better but in general his swelling seems to be increased. This is obviously not good news. I think this is what has given rise to the blisters. 12/04/17 on evaluation today patient presents for follow-up concerning his bilateral lower extremity edema in the right lower extremity ulcers. He has been tolerating the dressing changes without complication. With that being said he has had no real issues with the wraps which is also good news. Overall I'm pleased with the progress he's been making. 12/11/17 on evaluation today patient appears to be doing rather well in regard to his right lateral lower extremity ulcer. He's been tolerating the dressing changes without complication. Fortunately there does not appear to be any evidence of infection at this time. Overall I'm pleased with the progress that is being made. Unfortunately he has been in the hospital due to having what sounds to be a stomach virus/flu fortunately that is starting to get better. 12/18/17 on evaluation today patient actually appears to be doing very well in regard to his bilateral lower extremities the swelling is under fairly good control his lymphedema pumps are still not up and running quite yet. With that being said he does have several areas of opening noted as far as wounds are concerned mainly over the left lower extremity. With that being said I do believe once he gets lymphedema pumps this would least help you mention some the fluid and preventing this from occurring. Hopefully that  will be set up soon sleeves are Artie in place at his home he just waiting for the machine. 12/25/17 on evaluation today patient actually appears to be doing excellent in fact all of his ulcers appear to have resolved his legs appear very well. I do think he needs compression stockings we have discussed this and they are actually going to go to [place] today to elastic therapy to get this fitted for him. I think that is definitely a good thing to do. Readmission: 04/09/18 upon evaluation today this patient is seen for readmission due to bilateral lower extremity lymphedema. He has significant swelling of his extremities especially on the left although the right is also swollen he has weeping from both sides. There are no obvious open wounds at this point. Fortunately he has been doing fairly well for quite a bit of time since I last saw him. Nonetheless unfortunately this seems to have reopened and is giving quite a bit of trouble. He states this began about a week ago when he first called [place] to get in to be seen. No fevers, chills, nausea, or vomiting noted at this time. He has not been using his lymphedema pumps due to the fact that they won't fit on his leg at this point likewise is also not been using his compression for essentially the same reason. 04/16/18 upon evaluation today patient actually appears to be doing a little better in regard to the fluid in his bilateral lower extremities. With that being said he's had three falls since I saw him last week. He also states that he's been feeling very poorly. I was concerned last week and feel like that the concern is still there as  far as the congestion in his chest is concerned he seems to be breathing about the same as last week but again he states he's very weak he's not even able to walk further than from the chair to the door. His wife had to buy a wheelchair just to be able to get them out of the Adam Merritt, Adam Merritt ([id number]) house to get to  the appointment today. This has me very concerned. 04/23/18 on evaluation today patient actually appears to be doing much better than last week's evaluation. At that time actually had to transport him to the ER via EMS and he subsequently was admitted for acute pulmonary edema, acute renal failure, and acute congestive heart failure. Fortunately he is doing much better. Apparently they did dialyze him and were able to take off roughly 35 pounds of fluid. Nonetheless he is feeling much better both in regard to his breathing and he's able to get around much better at this time compared to previous. Overall I'm very happy with how things are at this time. There does not appear to be any evidence of infection currently. No fevers, chills, nausea, or vomiting noted at this time. 04/30/2018 patient seen today for follow-up and management of bilateral lower extremity lymphedema. He did express being more sad today than usual due to the recent loss of his dog. He states that he has been compliant with using the lymphedema pumps. However he does admit a minute over the last 2-3 days he has not been using the pumps due to the recent loss of his dog. At this time there is no drainage or open wounds to his lower extremities. The left leg edema is measuring smaller today. Still has a significant amount of edema on bilateral lower extremities With dry flaky skin. He will be referred to the lymphedema clinic for further management. Will continue 3 layer compression wraps and follow-up in 1-2 weeks.Denies any pain, fever, chairs recently. No recent falls or injuries reported during this visit. 05/07/18 on evaluation today patient actually appears to be doing very well in regard to his lower extremities in general all things considered. With that being said he is having some pain in the legs just due to the amount of swelling. He does have an area where he had a blister on the left lateral lower extremity this is open  at this point other than that there's nothing else weeping at this time. 05/14/18 on evaluation today patient actually appears to be doing excellent all things considered in regard to his lower extremities. He still has a couple areas of weeping on each leg which has continued to be the issue for him. He does have an appointment with the lymphedema clinic although this isn't until February 2020. That was the earliest they had. In the meantime he has continued to tolerate the compression wraps without complication. 05/28/18 on evaluation today patient actually appears to be doing more poorly in regard to his left lower extremity where he has a wound open at this point. He also had a fall where he subsequently injured his right great toe which has led to an open wound at the site unfortunately. He has been tolerating the dressing changes without complication in general as far as the wraps are concerned that he has not been putting any dressing on the left 1st toe ulcer site. 06/11/18 on evaluation today patient appears to be doing much worse in regard to his bilateral lower extremity ulcers. He has been tolerating the  dressing changes without complication although his legs have not been wrapped more recently. Overall I am not very pleased with the way his legs appear. I do believe he needs to be back in compression wraps he still has not received his compression wraps from the [facility] as of yet. 06/18/18 on evaluation today patient actually appears to be doing significantly better than last time I saw him. He has been tolerating the compression wraps without complication in the circumferential ulcers especially appear to be doing much better. His toe ulcer on the right in regard to the great toe is better although not as good as the legs in my pinion. No fevers chills noted 07/02/18 on evaluation today patient appears to be doing much better in regard to his lower extremity ulcers. Unfortunately since I  last saw him he's had the distal portion of his right great toe if he dated it sounds as if this actually went downhill very quickly. I had only seen him a few days prior and the toe did not appear to be infected at that point subsequently became infected very rapidly and it was decided by the surgeon that the distal portion of the toe needed to be removed. The patient seems to be doing well in this regard he tells me. With that being said his lower extremities are doing better from the standpoint of the wounds although he is significantly swollen at this point. 07/09/18 on evaluation today patient appears to be doing better in regard to the wounds on his lower extremities. In fact everything is almost completely healed he is just a small area on the left posterior lower extremity that is open at this point. 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 With that being said he needs some kind of compression it does sound like he may not be wearing his compression, that is the wraps, during the entire time between when he's here visit to visit. Apparently his wife took the current one off because it began to "fall apart". 07/16/18 on evaluation today patient appears to be extremely swollen especially in regard to his left lower extremity unfortunately. He also has a new skin tear over the left lower extremity and there's a smaller area on the right lower extremity as well. Unfortunately this seems to be due in part to blistering and fluid buildup in his leg. He did get the reduction wraps that were ordered by the [facility] for him to go to lymphedema clinic. With that being said his wounds on the legs have not healed to the point to where they would likely accept them as a patient lymphedema clinic currently. We need to try  to get this to heal. With that being said he's been taking his wraps off which is not doing him any favors at this point. In fact this is probably quite counterproductive compared to what needs to occur. We will likely need to increase to a four layer compression wrap and continue to also utilize elevation and he has to keep the wraps on not take them off as he's been doing currently hasn't had a wrap on since Saturday. 07/23/18 on evaluation today patient appears to be doing much better in regard to his bilateral lower extremities. In fact his left lower extremity which was the largest is actually 18  cm smaller today compared to what it was last time he was here in our clinic. This is obviously good news after just one week. Nonetheless the differences he actually kept the wraps on during the entire week this time. That's not typical for him. I do believe he understands a little bit better now the severity of the situation and why it's important for him to keep these wraps on. 07/30/18 on evaluation today patient actually appears to be doing rather well in regard to his lower extremities. His legs are much smaller than they have been in the past and he actually has only one very small rudder superficial region remaining that is not closed on the left lateral/posterior lower extremity even this is almost completely close. I do believe likely next week he will be healed without any complications. I do think we need to continue the wraps however this seems to be beneficial for him. I also think it may be a good time for [place] to go ahead and see about getting the appointment with the lymphedema clinic which is supposed to be made for him in order to keep this moving along and hopefully get them into compression wraps that will in the end help him to remain healed. 08/06/18 on evaluation today patient actually appears to be doing very well in regard as bilateral lower extremities. In fact his wounds appear to  be completely healed at this time. 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 He is in the process of getting appointment with the lymphedema clinic we have made this referral were just waiting to hear back on the schedule time. We need to follow up on that today as well. MYRAN, ARCIA ([id number]) 08/13/18 on evaluation today patient actually appears to be doing very well in regard to his bilateral lower extremities there are no open wounds at this point. We are gonna go ahead and see about ordering the Velcro compression wraps for him had a discussion with them about [place] doing it versus the [place] they feel like they can definitely afford going ahead and get the wraps themselves and they would prefer to try to avoid having to go to the lymphedema clinic if it all possible which I completely understand. As long as he has good compression I'm okay either way. 3/17//20 on evaluation today patient actually appears to be doing well in regard to his bilateral lower extremity ulcers. He has been tolerating the dressing changes without complication specifically the compression wraps. Overall is had no issues my fingers finding that I see at this point is that he is having trouble with constipation. He tells me he has not been able to go to the bathroom for about six days. He's taken to over-the-counter oral laxatives unfortunately this is not helping. He has not contacted his doctor. 08/27/18 on evaluation today patient appears to be doing fairly well in regard to his lower extremities at this point. There does not appear to be any new altars is swelling is very well controlled. We are still waiting for his Velcro compression wraps to arrive that should be sometime in the next week is Artie sent the check for these. 09/03/18 on evaluation today patient appears to be doing excellent in regard to his bilateral lower extremities which he shows no signs of  wound openings in regard to this point. He does have his Velcro compression wraps which did arrive in the mail since I saw  him last week. Overall he is doing excellent in my pinion. 09/13/18 on evaluation today patient appears to be doing very well currently in regard to the overall appearance of his bilateral lower extremities although he's a little bit more swollen than last time we saw him. At that point he been discharged without any open wounds. Nonetheless he has a small open wound on the posterior left lower extremity with some evidence of cellulitis noted as well. Fortunately I feel like he has made good progress overall with regard to his lower extremities from were things used to be. 09/20/18 on evaluation today patient actually appears to be doing much better. The erythematous lower extremity is improving wound itself which is still open appears to be doing much better as far as but appearance as well as pain is concerned overall very pleased in this regard. There's no signs of active infection at this time. 09/27/18 on evaluation today patient's wounds on the lower extremity actually appear to be doing fairly well at this time which is good news. There is no evidence of active infection currently and again is just as left lower extremity were there any wounds at this point anyway. I believe they may be completely healed but again I'm not 100% sure based on evaluation today. I think one more week of observation would likely be a good idea. 10/04/18 on evaluation today patient actually appears to be doing excellent in regard to the left lower extremity which actually appears to be completely healed as of today. Unfortunately he's been having issues with his right lower extremity have a new wound that has opened. Fortunately there's no evidence of active infection at this time which is good news. 10/10/18 upon evaluation today patient actually appears to be doing a little bit worse with the new  open area on his right posterior lower extremity. He's been tolerating the dressing changes without complication. Right now we been using his compression wraps although I think we may need to switch back to actually performing bilateral compression wraps are in the clinic. No fevers, chills, nausea, or vomiting noted at this time. 10/17/18 on evaluation today patient actually appears to be doing quite well in regard to his bilateral lower extremity ulcers. He has been tolerating the reps without complication although he would prefer not to rewrap his legs as of today. Fortunately there's no signs of active infection which is good news. No fevers, chills, nausea, or vomiting noted at this time. 10/24/18 on evaluation today patient appears to be doing very well in regard to his lower extremities. His right lower extremity is shown signs of healing and his left lower Trinity though not healed appears to be improving which is excellent news. Overall very pleased with how things seem to be progressing at this time. The patient is likewise happy to hear this. His Velcro compression wraps however have not been put on properly will gonna show his wife how to do that properly today. 10/31/18 on evaluation today patient appears to be doing more poorly in regard to his left lower extremity in particular although both lower extremities actually are showing some signs of being worse than my previous evaluation. Unfortunately I'm just not sure that his compression stockings even with the use of the compression/lymphedema pumps seem to be controlling this well. Upon further questioning he tells me that he also is not able to lie flat in the bed due to his congestive heart failure and difficulty breathing. For that reason he  sleeps in his recliner. To make matters worse his recliner also cannot even hold his legs up so instead of even being somewhat elevated they pretty much hang down to the floor. This is the way he  sleeps each night which is definitely counterproductive to everything else that were attempting to do from the standpoint of controlling his fluid. Nonetheless I think that he potentially could benefit from a hospital bed although this would be something that his primary care provider would likely have to order since anything that is order on our side has to be directly related to wound care and again the hospital bed is not necessarily a direct relation to although I think it does contribute to his overall wound status on his lower extremities. 11/07/18 on evaluation today patient appears to be doing better in regard to his bilateral lower extremity. He's been tolerating the dressing changes without complication. We did in the interim since I last saw him switch to just using extras orbiting new alginate and that seems to have done much better for him. I'm very pleased with the overall progress that is made. 11/14/18 on evaluation today patient appears to be redoing rather well in regard to his left lower extremity ulcers which are the only ones remaining at this point. Fortunately there's no signs of active infection at this time which is good news. Overall been very pleased with how things seem to be progressing currently. No fevers, chills, nausea, or vomiting noted at this time. 11/20/17 on evaluation today patient actually appears to be doing quite well in regard to his lower extremities on the left on the right he has several blisters that showed up although there's some question about whether or not he has had his broker compression wraps on like he was supposed to or not. Fortunately there's no signs of active infection at this time which is good news. Unfortunately though he is doing well from the standpoint of the left leg the right leg is not doing as well again this is when we did not wrap last week. Adam Merritt, Adam Merritt ([id number]) 11/28/18 on evaluation today patient appears to be doing well in  regard to his bilateral lower extremities is left appears to be healed is right is not healed but is very close to being so. Overall very pleased with how things seem to be progressing. Patient is likewise happy that things are doing well. 12/09/18 on evaluation today patient actually appears to be completely healed which is excellent news. He actually seems to be doing well in regard to the swelling of the bilateral lower extremities which is also great news. Overall very pleased with how things seem to be progressing. Readmission: 03/18/2019 upon evaluation today patient presents for reevaluation concerning issues that he is having with his left lower extremity where he does have a wound noted upon inspection today. He also has a wound on the right second toe on the tip where it is apparently been rubbing in his shoe I did look at issues and you can actually see where this has been occurring as well. Fortunately there is no signs of infection with regard to the toe necessarily although it is very swollen compared to normal that does have me somewhat concerned about the possibility of further evaluation for infection/osteomyelitis. I would recommend an x-ray to start with. Otherwise he states that it is been 1-2 weeks that he has had the draining in regard to left lower extremity he does not know how  this happened he has been wearing his compression appropriately and I do see that as well today but nonetheless I do think that he needs to continue to be very cautious with regard to elevation as well. 03/25/2019 on evaluation today patient appears to be doing much better in regard to his lower extremity at this time. That is on the left. He did culture positive for Pseudomonas but the good news is he seems to be doing much better the gentamicin we applied topically seems to have done a great job for him. There is no signs of active infection at this time systemically and locally he is doing much better.  No fevers, chills, nausea, vomiting, or diarrhea. In regard to his right toe this seems to be doing much better there is very little pressure noted at this point I did clean up some of the callus today around the edges of the wound as well as the surface of the wound but to be honest he is progressing quite nicely. 10/30; the area on the left anterior lower tibia area is healed over. His edema control is adequate even though his use of the compression pumps seems very intermittent. He has a juxta lite stocking on the right leg and a think there is 1 for the left leg at home. His wife is putting these on. He has an area on the plantar right second toe which is a hammertoe they are trying to offload this 04/08/2019 on evaluation today patient appears to be doing well in regard to his lower extremities which is good news. We are seeing him today for his toe ulcer which is giving him a little bit more trouble but again still seems to be doing better at this point which is good news. He is going require some sharp debridement in regard to the toe today however. 04/15/2019 on evaluation today patient actually appears to be doing quite well with regard to his toe ulcer. I am very pleased with how things are doing in that regard. With regard to his lower extremity edema in general he tells me he is not been using the lymphedema pumps regularly although he does not use them. I think that he needs to use them more regularly he does have a small blister on the left lower extremity this is not open at this point but obviously it something that can get worse if he does not keep the compression going and the pumps going as well. 04/22/2019 on evaluation today patient appears to be doing well with regard to his toe ulcer. He has been tolerating the dressing changes without complication. This is measuring slightly smaller this week as compared to last week. Fortunately there is no signs of active infection at this  time. 05/06/2019 on evaluation today patient actually appears to be doing excellent. In fact his toe appears to be completely healed which is great news. There is no signs of active infection at this time. Readmission: Patient presents today for follow-up after having been discharged at the beginning of December 2020. He states that in recent weeks things have reopened and has been having more trouble at this point. With that being said fortunately there is no signs of active infection at this time. No fever chills noted. Nonetheless he also does have an issue with one of his toes as well that seems to be somewhat this is on the right foot second toe. 07/25/2019 upon evaluation today patient's lower extremities appear to be doing excellent  bilaterally. I really feel like he is showing signs of great improvement and in fact I think he is close to healing which is great news. There is no signs of active infection at this time. No fevers, chills, nausea, vomiting, or diarrhea. 07/31/2019 upon evaluation today patient appears to be doing excellent and in fact I think may be completely healed in regard to his right lower extremity that were still to monitor this 1 more week before closing it out. The left lower extremity is measuring smaller although not completely closed seems to be doing excellent. 08/07/2019 upon evaluation today patient appears to be doing excellent in regard to his wounds. In fact the right lower extremity is completely healed there is no issues here. The left lower extremity has 1 very small area still remaining fortunately there is no signs of infection and overall I think he is very close to closure of the here as well. 08/14/19 upon evaluation today patient appears to be doing excellent in regard to his lower extremities. In fact he appears to be completely healed as of today. Fortunately there's no signs of active infection at this time. No fevers, chills, nausea, or vomiting noted  at this time. Electronic Signature(s) Signed: 08/17/2019 5:27:24 PM By: Adam Keeler PA-C Entered By: Adam [facility] on 08/17/2019 17:13:47 Adam [facility] ([id number]) -------------------------------------------------------------------------------- Physical Exam Details Patient Name: Adam [facility] Date of Service: 08/14/2019 1:30 PM Medical Record Number: [id number] Patient Account Number: [id number] Date of Birth/Sex: 10-Aug-1945 (73 y.o. M) Treating RN: Adam [facility] Primary Care Provider: [facility] Other Clinician: Referring Provider: [facility] Treating Provider/Extender: STONE III, Celia Gibbons Weeks in [facility]: 4 Constitutional Well-nourished and well-hydrated in no acute distress. Respiratory normal breathing without difficulty. Psychiatric this patient is able to make decisions and demonstrates good insight into disease process. Alert and Oriented x 3. pleasant and cooperative. Notes Patient's wound bed currently showed signs of good Epithelialization at this point. Fortunately there is no evidence of active infection. Overall very pleased with how things seem to be progressing. No fevers, chills, nausea, or vomiting noted at this time. Electronic Signature(s) Signed: 08/17/2019 5:27:24 PM By: Adam Keeler PA-C Entered By: Adam [facility] on 08/17/2019 17:14:34 Adam [facility] ([id number]) -------------------------------------------------------------------------------- Physician Orders Details Patient Name: Adam [facility] Date of Service: 08/14/2019 1:30 PM Medical Record Number: [id number] Patient Account Number: [id number] Date of Birth/Sex: 1945/08/06 (73 y.o. M) Treating RN: Adam [facility] Primary Care Provider: [facility] Other Clinician: Referring Provider: [facility] Treating Provider/Extender: Adam [facility], Everett Ehrler Weeks in [facility]: 4 Verbal / Phone Orders: No Diagnosis Coding ICD-10 Coding Code Description I89.0 Lymphedema, not elsewhere classified L97.512  Non-pressure chronic ulcer of other part of right foot with fat layer exposed L97.822 Non-pressure chronic ulcer of other part of left lower leg with fat layer exposed E11.621 Type 2 diabetes mellitus with foot ulcer L97.511 Non-pressure chronic ulcer of other part of right foot limited to breakdown of skin E66.09 Other obesity due to excess calories Discharge From [facility] o Discharge from [facility] complete Electronic Signature(s) Signed: 08/14/2019 3:43:50 PM By: Adam [facility] Signed: 08/15/2019 4:19:26 PM By: Adam Keeler PA-C Entered By: Adam [facility]itt on 08/14/2019 13:59:01 Adam [facility] ([id number]) -------------------------------------------------------------------------------- Problem List Details Patient Name: Adam [facility] Date of Service: 08/14/2019 1:30 PM Medical Record Number: [id number] Patient Account Number: [id number] Date of Birth/Sex: 1945-11-09 (73 y.o. M) Treating RN: Adam [facility] Primary Care Provider: Clayborn [facility] Other Clinician: Referring Provider:  KHAN, FOZIA Treating Provider/Extender: STONE III, Keevan Wolz Weeks in [facility]: 4 Active Problems ICD-10 Evaluated Encounter Code Description Active Date Today Diagnosis I89.0 Lymphedema, not elsewhere classified 07/17/2019 No Yes L97.512 Non-pressure chronic ulcer of other part of right foot with fat layer exposed 07/17/2019 No Yes L97.822 Non-pressure chronic ulcer of other part of left lower leg with fat layer 07/17/2019 No Yes exposed E11.621 Type 2 diabetes mellitus with foot ulcer 07/17/2019 No Yes L97.511 Non-pressure chronic ulcer of other part of right foot limited to breakdown 07/17/2019 No Yes of skin E66.09 Other obesity due to excess calories 07/17/2019 No Yes Inactive Problems Resolved Problems Electronic Signature(s) Signed: 08/14/2019 1:51:40 PM By: Adam Keeler PA-C Entered By: Adam [facility] on 08/14/2019 13:51:39 Adam [facility]  ([id number]) -------------------------------------------------------------------------------- Progress Note Details Patient Name: Adam [facility] Date of Service: 08/14/2019 1:30 PM Medical Record Number: [id number] Patient Account Number: [id number] Date of Birth/Sex: [birth year] (73 y.o. M) Treating RN: Adam [facility] Primary Care Provider: Clayborn [facility] Other Clinician: Referring Provider: Clayborn [facility] Treating Provider/Extender: Adam [facility], Digna Countess Weeks in [facility]: 4 Subjective Chief Complaint Information obtained from Patient Bilateral LE Ulcers and right second toe ulcer History of Present Illness (HPI) 10/18/17-He is here for initial evaluation of bilateral lower extremity ulcerations in the presence of venous insufficiency and lymphedema. He has been seen by [facility] in the past, Dr. Lucky [facility], last seen in 2016. He does have a history of abnormal ABIs, which is to be expected given his lymphedema and venous insufficiency. According to Epic, it appears that all attempts for arterial evaluation and/or angiography were not follow through with by patient. He does have a history of being seen in lymphedema clinic in 2018, stopped going approximately 6 months ago stating "it didn't do any good". He does not have lymphedema pumps, he does not have custom fit compression wrap/stockings. He is diabetic and his recent A1c last month was 7.6. He admits to chronic bilateral lower extremity pain, no change in pain since blister and ulceration development. He is currently being treated with Levaquin for bronchitis. He has home health and we will continue. 10/25/17-He is here in follow-up evaluation for bilateral lower extremity ulcerationssubtle he remains on Levaquin for bronchitis. Right lower extremity with no evidence of drainage or ulceration, persistent left lower extremity ulceration. He states that home health has not been out since his appointment. He went to  [facility] on  Tuesday, studies revealed: RIGHT ABI 0.9, TBI 0.6 LEFT ABI 1.1, TBI 0.6 with triphasic flow bilaterally. We will continue his same [facility] plan. He has been educated on compression therapy and need for elevation. He will benefit from lymphedema pumps 11/01/17-He is here in follow-up evaluation for left lower extremity ulcer. The right lower extremity remains healed. He has home health services, but they have not been out to see the patient for 2-3 weeks. He states it home health physical therapy changed his dressing yesterday after therapy; he placed Ace wrap compression. We are still waiting for lymphedema pumps, reordered d/t need for company change. 11/08/17-He is here in follow-up evaluation for left lower extremity ulcer. It is improved. Edema is significantly improved with compression therapy. We will continue with same [facility] plan and he will follow-up next week. No word regarding lymphedema pumps 11/15/17-He is here in follow-up evaluation for left lower extremity ulcer. He is healed and will be discharged from wound care services. I have reached out to [facility] regarding his lymphedema pumps. They have been unable  to reach the patient; the contact number they had with the patient's wife's cell phone and she has not answered any unrecognized calls. Contact should be made today, trial planned for next week; [facility] will continue to follow 11/27/17 on evaluation today patient has multiple blistered areas over the right lower extremity his left lower extremity appears to be doing okay. These blistered areas show signs of no infection which is great news. With that being said he did have some necrotic skin overlying which was mechanically debrided away with saline and gauze today without complication. Overall post debridement the wounds appear to be doing better but in general his swelling seems to be increased. This is obviously not good news. I think this is what has given  rise to the blisters. 12/04/17 on evaluation today patient presents for follow-up concerning his bilateral lower extremity edema in the right lower extremity ulcers. He has been tolerating the dressing changes without complication. With that being said he has had no real issues with the wraps which is also good news. Overall I'm pleased with the progress he's been making. 12/11/17 on evaluation today patient appears to be doing rather well in regard to his right lateral lower extremity ulcer. He's been tolerating the dressing changes without complication. Fortunately there does not appear to be any evidence of infection at this time. Overall I'm pleased with the progress that is being made. Unfortunately he has been in the hospital due to having what sounds to be a stomach virus/flu fortunately that is starting to get better. 12/18/17 on evaluation today patient actually appears to be doing very well in regard to his bilateral lower extremities the swelling is under fairly good control his lymphedema pumps are still not up and running quite yet. With that being said he does have several areas of opening noted as far as wounds are concerned mainly over the left lower extremity. With that being said I do believe once he gets lymphedema pumps this would least help you mention some the fluid and preventing this from occurring. Hopefully that will be set up soon sleeves are Artie in place at his home he just waiting for the machine. 12/25/17 on evaluation today patient actually appears to be doing excellent in fact all of his ulcers appear to have resolved his legs appear very well. I do think he needs compression stockings we have discussed this and they are actually going to go to [place] today to elastic therapy to get this fitted for him. I think that is definitely a good thing to do. Readmission: 04/09/18 upon evaluation today this patient is seen for readmission due to bilateral lower extremity  lymphedema. He has significant swelling of his extremities especially on the left although the right is also swollen he has weeping from both sides. There are no obvious open wounds at this point. Fortunately he has been doing fairly well for quite a bit of time since I last saw him. Nonetheless unfortunately this seems to have reopened and is giving quite a bit of trouble. He states this began about a week ago when he first called [place] to get in to be seen. No fevers, chills, nausea, or vomiting noted at this time. He has not been using his lymphedema pumps due to the fact that they won't fit on his leg at this point likewise is also not been using his compression for essentially the same reason. Adam [facility], Adam [facility] ([id number]) 04/16/18 upon evaluation today patient actually appears to be  doing a little better in regard to the fluid in his bilateral lower extremities. With that being said he's had three falls since I saw him last week. He also states that he's been feeling very poorly. I was concerned last week and feel like that the concern is still there as far as the congestion in his chest is concerned he seems to be breathing about the same as last week but again he states he's very weak he's not even able to walk further than from the chair to the door. His wife had to buy a wheelchair just to be able to get them out of the house to get to the appointment today. This has me very concerned. 04/23/18 on evaluation today patient actually appears to be doing much better than last week's evaluation. At that time actually had to transport him to the ER via EMS and he subsequently was admitted for acute pulmonary edema, acute renal failure, and acute congestive heart failure. Fortunately he is doing much better. Apparently they did dialyze him and were able to take off roughly 35 pounds of fluid. Nonetheless he is feeling much better both in regard to his breathing and he's able to get around much better at  this time compared to previous. Overall I'm very happy with how things are at this time. There does not appear to be any evidence of infection currently. No fevers, chills, nausea, or vomiting noted at this time. 04/30/2018 patient seen today for follow-up and management of bilateral lower extremity lymphedema. He did express being more sad today than usual due to the recent loss of his dog. He states that he has been compliant with using the lymphedema pumps. However he does admit a minute over the last 2-3 days he has not been using the pumps due to the recent loss of his dog. At this time there is no drainage or open wounds to his lower extremities. The left leg edema is measuring smaller today. Still has a significant amount of edema on bilateral lower extremities With dry flaky skin. He will be referred to the lymphedema clinic for further management. Will continue 3 layer compression wraps and follow-up in 1-2 weeks.Denies any pain, fever, chairs recently. No recent falls or injuries reported during this visit. 05/07/18 on evaluation today patient actually appears to be doing very well in regard to his lower extremities in general all things considered. With that being said he is having some pain in the legs just due to the amount of swelling. He does have an area where he had a blister on the left lateral lower extremity this is open at this point other than that there's nothing else weeping at this time. 05/14/18 on evaluation today patient actually appears to be doing excellent all things considered in regard to his lower extremities. He still has a couple areas of weeping on each leg which has continued to be the issue for him. He does have an appointment with the lymphedema clinic although this isn't until February 2020. That was the earliest they had. In the meantime he has continued to tolerate the compression wraps without complication. 05/28/18 on evaluation today patient actually appears  to be doing more poorly in regard to his left lower extremity where he has a wound open at this point. He also had a fall where he subsequently injured his right great toe which has led to an open wound at the site unfortunately. He has been tolerating the dressing changes without complication  in general as far as the wraps are concerned that he has not been putting any dressing on the left 1st toe ulcer site. 06/11/18 on evaluation today patient appears to be doing much worse in regard to his bilateral lower extremity ulcers. He has been tolerating the dressing changes without complication although his legs have not been wrapped more recently. Overall I am not very pleased with the way his legs appear. I do believe he needs to be back in compression wraps he still has not received his compression wraps from the [facility] hospital as of yet. 06/18/18 on evaluation today patient actually appears to be doing significantly better than last time I saw him. He has been tolerating the compression wraps without complication in the circumferential ulcers especially appear to be doing much better. His toe ulcer on the right in regard to the great toe is better although not as good as the legs in my pinion. No fevers chills noted 07/02/18 on evaluation today patient appears to be doing much better in regard to his lower extremity ulcers. Unfortunately since I last saw him he's had the distal portion of his right great toe if he dated it sounds as if this actually went downhill very quickly. I had only seen him a few days prior and the toe did not appear to be infected at that point subsequently became infected very rapidly and it was decided by the surgeon that the distal portion of the toe needed to be removed. The patient seems to be doing well in this regard he tells me. With that being said his lower extremities are doing better from the standpoint of the wounds although he is significantly swollen at this  point. 07/09/18 on evaluation today patient appears to be doing better in regard to the wounds on his lower extremities. In fact everything is almost completely healed he is just a small area on the left posterior lower extremity that is open at this point. 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 With that being said he needs some kind of compression it does sound like he may not be wearing his compression, that is the wraps, during the entire time between when he's here visit to visit. Apparently his wife took the current one off because it began to "fall apart". 07/16/18 on evaluation today patient appears to be extremely swollen especially in regard to his left lower extremity unfortunately. He also has a new skin tear over the left lower extremity and there's a smaller area on the right lower extremity as well. Unfortunately this seems to be due in part to blistering and fluid buildup in his leg. He did get the reduction wraps that were ordered by the [facility] hospital for him to go to lymphedema clinic. With that being said his wounds on the legs have not healed to the point to where they would likely accept them as a patient lymphedema clinic currently. We need to try to get this to heal. With that being said he's been taking his wraps off which is not doing him any favors at this point. In fact this is probably quite counterproductive compared to what needs to occur. We will likely need to increase to a four layer compression wrap and continue to also utilize elevation and he has to keep the wraps on  not take them off as he's been doing currently hasn't had a wrap on since Saturday. 07/23/18 on evaluation today patient appears to be doing much better in regard to his bilateral lower extremities. In fact his left lower  extremity which was the largest is actually 15 cm smaller today compared to what it was last time he was here in our clinic. This is obviously good news after just one week. Nonetheless the differences he actually kept the wraps on during the entire week this time. That's not typical for him. I do believe he understands a little bit better now the severity of the situation and why it's important for him to keep these wraps on. 07/30/18 on evaluation today patient actually appears to be doing rather well in regard to his lower extremities. His legs are much smaller than they have been in the past and he actually has only one very small rudder superficial region remaining that is not closed on the left lateral/posterior lower extremity even this is almost completely close. I do believe likely next week he will be healed without any complications. I do think we need to continue the wraps however this seems to be beneficial for him. I also think it may be a good time for [place] to go ahead and see about getting the appointment with the lymphedema clinic which is supposed to be made for him in order to keep this moving along and hopefully get them into compression wraps that will in the end help him to remain healed. Adam [facility], Adam [facility]itt ([id number]) 08/06/18 on evaluation today patient actually appears to be doing very well in regard as bilateral lower extremities. In fact his wounds appear to be completely healed at this time. 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 He is in the process of getting appointment with the lymphedema clinic we have made this referral were just waiting to hear back on the schedule time. We need to follow up on that today as well. 08/13/18 on evaluation today patient actually appears to be doing very well in regard to his bilateral lower extremities there are no open wounds at this point. We are gonna go ahead and see about  ordering the Velcro compression wraps for him had a discussion with them about [place] doing it versus the [place] they feel like they can definitely afford going ahead and get the wraps themselves and they would prefer to try to avoid having to go to the lymphedema clinic if it all possible which I completely understand. As long as he has good compression I'm okay either way. 3/17//20 on evaluation today patient actually appears to be doing well in regard to his bilateral lower extremity ulcers. He has been tolerating the dressing changes without complication specifically the compression wraps. Overall is had no issues my fingers finding that I see at this point is that he is having trouble with constipation. He tells me he has not been able to go to the bathroom for about six days. He's taken to over-the-counter oral laxatives unfortunately this is not helping. He has not contacted his doctor. 08/27/18 on evaluation today patient appears to be doing fairly well in regard to his lower extremities at this point. There does not appear to be any new altars is swelling is very well controlled. We are still waiting for his Velcro compression wraps to arrive that should be sometime in the next week is Artie sent the check for  these. 09/03/18 on evaluation today patient appears to be doing excellent in regard to his bilateral lower extremities which he shows no signs of wound openings in regard to this point. He does have his Velcro compression wraps which did arrive in the mail since I saw him last week. Overall he is doing excellent in my pinion. 09/13/18 on evaluation today patient appears to be doing very well currently in regard to the overall appearance of his bilateral lower extremities although he's a little bit more swollen than last time we saw him. At that point he been discharged without any open wounds. Nonetheless he has a small open wound on the posterior left lower extremity with some evidence of  cellulitis noted as well. Fortunately I feel like he has made good progress overall with regard to his lower extremities from were things used to be. 09/20/18 on evaluation today patient actually appears to be doing much better. The erythematous lower extremity is improving wound itself which is still open appears to be doing much better as far as but appearance as well as pain is concerned overall very pleased in this regard. There's no signs of active infection at this time. 09/27/18 on evaluation today patient's wounds on the lower extremity actually appear to be doing fairly well at this time which is good news. There is no evidence of active infection currently and again is just as left lower extremity were there any wounds at this point anyway. I believe they may be completely healed but again I'm not 100% sure based on evaluation today. I think one more week of observation would likely be a good idea. 10/04/18 on evaluation today patient actually appears to be doing excellent in regard to the left lower extremity which actually appears to be completely healed as of today. Unfortunately he's been having issues with his right lower extremity have a new wound that has opened. Fortunately there's no evidence of active infection at this time which is good news. 10/10/18 upon evaluation today patient actually appears to be doing a little bit worse with the new open area on his right posterior lower extremity. He's been tolerating the dressing changes without complication. Right now we been using his compression wraps although I think we may need to switch back to actually performing bilateral compression wraps are in the clinic. No fevers, chills, nausea, or vomiting noted at this time. 10/17/18 on evaluation today patient actually appears to be doing quite well in regard to his bilateral lower extremity ulcers. He has been tolerating the reps without complication although he would prefer not to rewrap his  legs as of today. Fortunately there's no signs of active infection which is good news. No fevers, chills, nausea, or vomiting noted at this time. 10/24/18 on evaluation today patient appears to be doing very well in regard to his lower extremities. His right lower extremity is shown signs of healing and his left lower [place] though not healed appears to be improving which is excellent news. Overall very pleased with how things seem to be progressing at this time. The patient is likewise happy to hear this. His Velcro compression wraps however have not been put on properly will gonna show his wife how to do that properly today. 10/31/18 on evaluation today patient appears to be doing more poorly in regard to his left lower extremity in particular although both lower extremities actually are showing some signs of being worse than my previous evaluation. Unfortunately I'm just not sure that  his compression stockings even with the use of the compression/lymphedema pumps seem to be controlling this well. Upon further questioning he tells me that he also is not able to lie flat in the bed due to his congestive heart failure and difficulty breathing. For that reason he sleeps in his recliner. To make matters worse his recliner also cannot even hold his legs up so instead of even being somewhat elevated they pretty much hang down to the floor. This is the way he sleeps each night which is definitely counterproductive to everything else that were attempting to do from the standpoint of controlling his fluid. Nonetheless I think that he potentially could benefit from a hospital bed although this would be something that his primary care provider would likely have to order since anything that is order on our side has to be directly related to wound care and again the hospital bed is not necessarily a direct relation to although I think it does contribute to his overall wound status on his lower extremities. 11/07/18  on evaluation today patient appears to be doing better in regard to his bilateral lower extremity. He's been tolerating the dressing changes without complication. We did in the interim since I last saw him switch to just using extras orbiting new alginate and that seems to have done much better for him. I'm very pleased with the overall progress that is made. 11/14/18 on evaluation today patient appears to be redoing rather well in regard to his left lower extremity ulcers which are the only ones remaining at this point. Fortunately there's no signs of active infection at this time which is good news. Overall been very pleased with how things seem to be progressing currently. No fevers, chills, nausea, or vomiting noted at this time. Adam Merritt, Adam Merritt ([id number]) 11/20/17 on evaluation today patient actually appears to be doing quite well in regard to his lower extremities on the left on the right he has several blisters that showed up although there's some question about whether or not he has had his broker compression wraps on like he was supposed to or not. Fortunately there's no signs of active infection at this time which is good news. Unfortunately though he is doing well from the standpoint of the left leg the right leg is not doing as well again this is when we did not wrap last week. 11/28/18 on evaluation today patient appears to be doing well in regard to his bilateral lower extremities is left appears to be healed is right is not healed but is very close to being so. Overall very pleased with how things seem to be progressing. Patient is likewise happy that things are doing well. 12/09/18 on evaluation today patient actually appears to be completely healed which is excellent news. He actually seems to be doing well in regard to the swelling of the bilateral lower extremities which is also great news. Overall very pleased with how things seem to be progressing. Readmission: 03/18/2019 upon  evaluation today patient presents for reevaluation concerning issues that he is having with his left lower extremity where he does have a wound noted upon inspection today. He also has a wound on the right second toe on the tip where it is apparently been rubbing in his shoe I did look at issues and you can actually see where this has been occurring as well. Fortunately there is no signs of infection with regard to the toe necessarily although it is very swollen compared to normal  that does have me somewhat concerned about the possibility of further evaluation for infection/osteomyelitis. I would recommend an x-ray to start with. Otherwise he states that it is been 1-2 weeks that he has had the draining in regard to left lower extremity he does not know how this happened he has been wearing his compression appropriately and I do see that as well today but nonetheless I do think that he needs to continue to be very cautious with regard to elevation as well. 03/25/2019 on evaluation today patient appears to be doing much better in regard to his lower extremity at this time. That is on the left. He did culture positive for Pseudomonas but the good news is he seems to be doing much better the gentamicin we applied topically seems to have done a great job for him. There is no signs of active infection at this time systemically and locally he is doing much better. No fevers, chills, nausea, vomiting, or diarrhea. In regard to his right toe this seems to be doing much better there is very little pressure noted at this point I did clean up some of the callus today around the edges of the wound as well as the surface of the wound but to be honest he is progressing quite nicely. 10/30; the area on the left anterior lower tibia area is healed over. His edema control is adequate even though his use of the compression pumps seems very intermittent. He has a juxta lite stocking on the right leg and a think there is 1  for the left leg at home. His wife is putting these on. He has an area on the plantar right second toe which is a hammertoe they are trying to offload this 04/08/2019 on evaluation today patient appears to be doing well in regard to his lower extremities which is good news. We are seeing him today for his toe ulcer which is giving him a little bit more trouble but again still seems to be doing better at this point which is good news. He is going require some sharp debridement in regard to the toe today however. 04/15/2019 on evaluation today patient actually appears to be doing quite well with regard to his toe ulcer. I am very pleased with how things are doing in that regard. With regard to his lower extremity edema in general he tells me he is not been using the lymphedema pumps regularly although he does not use them. I think that he needs to use them more regularly he does have a small blister on the left lower extremity this is not open at this point but obviously it something that can get worse if he does not keep the compression going and the pumps going as well. 04/22/2019 on evaluation today patient appears to be doing well with regard to his toe ulcer. He has been tolerating the dressing changes without complication. This is measuring slightly smaller this week as compared to last week. Fortunately there is no signs of active infection at this time. 05/06/2019 on evaluation today patient actually appears to be doing excellent. In fact his toe appears to be completely healed which is great news. There is no signs of active infection at this time. Readmission: Patient presents today for follow-up after having been discharged at the beginning of December 2020. He states that in recent weeks things have reopened and has been having more trouble at this point. With that being said fortunately there is no signs of active  infection at this time. No fever chills noted. Nonetheless he also does have  an issue with one of his toes as well that seems to be somewhat this is on the right foot second toe. 07/25/2019 upon evaluation today patient's lower extremities appear to be doing excellent bilaterally. I really feel like he is showing signs of great improvement and in fact I think he is close to healing which is great news. There is no signs of active infection at this time. No fevers, chills, nausea, vomiting, or diarrhea. 07/31/2019 upon evaluation today patient appears to be doing excellent and in fact I think may be completely healed in regard to his right lower extremity that were still to monitor this 1 more week before closing it out. The left lower extremity is measuring smaller although not completely closed seems to be doing excellent. 08/07/2019 upon evaluation today patient appears to be doing excellent in regard to his wounds. In fact the right lower extremity is completely healed there is no issues here. The left lower extremity has 1 very small area still remaining fortunately there is no signs of infection and overall I think he is very close to closure of the here as well. 08/14/19 upon evaluation today patient appears to be doing excellent in regard to his lower extremities. In fact he appears to be completely healed as of today. Fortunately there's no signs of active infection at this time. No fevers, chills, nausea, or vomiting noted at this time. Adam Merritt, Adam Merritt ([id number]) Objective Constitutional Well-nourished and well-hydrated in no acute distress. Vitals Time Taken: 1:40 PM, Height: 66 in, Weight: 292 lbs, BMI: 47.1, Temperature: 98.7 F, Pulse: 90 bpm, Respiratory Rate: 18 breaths/min, Blood Pressure: 166/79 mmHg. Respiratory normal breathing without difficulty. Psychiatric this patient is able to make decisions and demonstrates good insight into disease process. Alert and Oriented x 3. pleasant and cooperative. General Notes: Patient's wound bed currently showed signs  of good Epithelialization at this point. Fortunately there is no evidence of active infection. Overall very pleased with how things seem to be progressing. No fevers, chills, nausea, or vomiting noted at this time. Integumentary (Hair, Skin) Wound #27 status is Healed - Epithelialized. Original cause of wound was Gradually Appeared. The wound is located on the Left,Lateral Lower Leg. The wound measures 0cm length x 0cm width x 0cm depth; 0cm^2 area and 0cm^3 volume. There is Fat Layer (Subcutaneous Tissue) Exposed exposed. There is a medium amount of serous drainage noted. The wound margin is flat and intact. There is large (67-100%) pink granulation within the wound bed. There is no necrotic tissue within the wound bed. 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 He does need to continue with his own compression stockings which I think is definitely gonna be beneficial for him as was keeping the edema under control. She's in agreement with this plan. Subsequently will see him back in the future for reevaluation as needed if anything  changes or worsens. Will see the patient back for follow-up visit as needed. Electronic Signature(s) Signed: 08/17/2019 5:27:24 PM By: Melynda Ripple ([id number]) Entered By: Adam Merritt on 08/17/2019 17:15:08 Adam Merritt  ([id number]) -------------------------------------------------------------------------------- SuperBill Details Patient Name: Adam Merritt Date of Service: 08/14/2019 Medical Record Number: [id number] Patient Account Number: [id number] Date of Birth/Sex: 10/19/1945 (73 y.o. M) Treating RN: Adam Merritt Primary Care Provider: Clayborn Merritt Other Clinician: Referring Provider: Clayborn Merritt Treating Provider/Extender: Adam Merritt, Vinessa Macconnell Weeks in Merritt: 4 Diagnosis Coding ICD-10 Codes Code Description I89.0 Lymphedema, not elsewhere classified L97.512 Non-pressure chronic ulcer of other part of right foot with fat layer exposed L97.822 Non-pressure chronic ulcer of other part of left lower leg with fat layer exposed E11.621 Type 2 diabetes mellitus with foot ulcer L97.511 Non-pressure chronic ulcer of other part of right foot limited to breakdown of skin E66.09 Other obesity due to excess calories Facility Procedures CPT4 Code: [id number] Description: 99213 - WOUND CARE VISIT-LEV 3 EST PT Modifier: Quantity: 1 Physician Procedures CPT4 Code: [id number] Description: 99213 - WC PHYS LEVEL 3 - EST PT Modifier: Quantity: 1 CPT4 Code: Description: ICD-10 Diagnosis Description I89.0 Lymphedema, not elsewhere classified L97.512 Non-pressure chronic ulcer of other part of right foot with fat layer expos L97.822 Non-pressure chronic ulcer of other part of left lower leg with fat layer e  J49.702 Type 2 diabetes mellitus with foot ulcer Modifier: ed xposed Quantity: Electronic Signature(s) Signed: 08/15/2019 4:19:26 PM By: Adam Keeler PA-C Entered By: Adam Merritt on 08/14/2019 23:56:25

## 2019-08-14 NOTE — Progress Notes (Signed)
Adam Merritt, Adam Merritt ([id number]) Visit Report for 08/14/2019 Arrival Information Details Patient Name: Adam Merritt, Adam Merritt Date of Service: 08/14/2019 1:30 PM Medical Record Number: [id number] Patient Account Number: [id number] Date of Birth/Sex: Jan 06, 1946 (73 y.o. M) Treating RN: Cornell Barman Primary Care Deijah Spikes: [name] Other Clinician: Referring Bobbyjoe Pabst: [name] Treating Nassir Neidert/Extender: Melburn Hake, [name]: 4 Visit Information History Since Last Visit Added or deleted any medications: No Patient Arrived: Walker Has Dressing in Place as Prescribed: Yes Arrival Time: 13:40 Has Compression in Place as Prescribed: Yes Accompanied By: wife Pain Present Now: No Transfer Assistance: None Patient Identification Verified: Yes Secondary Verification Process Completed: Yes Electronic Signature(s) Signed: 08/14/2019 4:33:53 PM By: Gretta Cool, BSN, RN, CWS, Kim RN, BSN Entered By: Gretta Cool, BSN, RN, CWS, Kim on 08/14/2019 13:40:42 Adam Merritt ([id number]) -------------------------------------------------------------------------------- Clinic Level of Care Assessment Details Patient Name: Adam Merritt Date of Service: 08/14/2019 1:30 PM Medical Record Number: [id number] Patient Account Number: [id number] Date of Birth/Sex: 1946-03-11 (73 y.o. M) Treating RN: Army Melia Primary Care Tristian Bouska: [name] Other Clinician: Referring Kolter Reaver: [name] Treating Marlys Stegmaier/Extender: Melburn Hake, [name]: 4 Clinic Level of Care Assessment Items TOOL 4 Quantity Score []  - Use when only an EandM is performed on FOLLOW-UP visit 0 ASSESSMENTS - Nursing Assessment / Reassessment X - Reassessment of Co-morbidities (includes updates in patient status) 1 10 X- 1 5 Reassessment of Adherence to Treatment Plan ASSESSMENTS - Wound and Skin Assessment / Reassessment X - Simple Wound Assessment / Reassessment - one wound 1 5 []  - 0 Complex Wound Assessment / Reassessment -  multiple wounds []  - 0 Dermatologic / Skin Assessment (not related to wound area) ASSESSMENTS - Focused Assessment X - Circumferential Edema Measurements - multi extremities 1 5 []  - 0 Nutritional Assessment / Counseling / Intervention X- 1 5 Lower Extremity Assessment (monofilament, tuning fork, pulses) []  - 0 Peripheral Arterial Disease Assessment (using hand held doppler) ASSESSMENTS - Ostomy and/or Continence Assessment and Care []  - Incontinence Assessment and Management 0 []  - 0 Ostomy Care Assessment and Management (repouching, etc.) PROCESS - Coordination of Care X - Simple Patient / Family Education for ongoing care 1 15 []  - 0 Complex (extensive) Patient / Family Education for ongoing care X- 1 10 Staff obtains Programmer, systems, Records, Test Results / Process Orders []  - 0 Staff telephones HHA, Nursing Homes / Clarify orders / etc []  - 0 Routine Transfer to another Facility (non-emergent condition) []  - 0 Routine Hospital Admission (non-emergent condition) []  - 0 New Admissions / Biomedical engineer / Ordering NPWT, Apligraf, etc. []  - 0 Emergency Hospital Admission (emergent condition) X- 1 10 Simple Discharge Coordination []  - 0 Complex (extensive) Discharge Coordination PROCESS - Special Needs []  - Pediatric / Minor Patient Management 0 []  - 0 Isolation Patient Management []  - 0 Hearing / Language / Visual special needs []  - 0 Assessment of Community assistance (transportation, D/C planning, etc.) Adam Merritt, Adam Merritt ([id number]) []  - 0 Additional assistance / Altered mentation []  - 0 Support Surface(s) Assessment (bed, cushion, seat, etc.) INTERVENTIONS - Wound Cleansing / Measurement X - Simple Wound Cleansing - one wound 1 5 []  - 0 Complex Wound Cleansing - multiple wounds X- 1 5 Wound Imaging (photographs - any number of wounds) []  - 0 Wound Tracing (instead of photographs) X- 1 5 Simple Wound Measurement - one wound []  - 0 Complex Wound  Measurement - multiple wounds INTERVENTIONS - Wound Dressings X - Small Wound Dressing one  or multiple wounds 1 10 []  - 0 Medium Wound Dressing one or multiple wounds []  - 0 Large Wound Dressing one or multiple wounds []  - 0 Application of Medications - topical []  - 0 Application of Medications - injection INTERVENTIONS - Miscellaneous []  - External ear exam 0 []  - 0 Specimen Collection (cultures, biopsies, blood, body fluids, etc.) []  - 0 Specimen(s) / Culture(s) sent or taken to Lab for analysis []  - 0 Patient Transfer (multiple staff / Civil Service fast streamer / Similar devices) []  - 0 Simple Staple / Suture removal (25 or less) []  - 0 Complex Staple / Suture removal (26 or more) []  - 0 Hypo / Hyperglycemic Management (close monitor of Blood Glucose) []  - 0 Ankle / Brachial Index (ABI) - do not check if billed separately X- 1 5 Vital Signs Has the patient been seen at the hospital within the last three years: Yes Total Score: 95 Level Of Care: New/Established - Level 3 Electronic Signature(s) Signed: 08/14/2019 3:43:50 PM By: Army Melia Entered By: Army Melia on 08/14/2019 13:59:42 Adam Merritt ([id number]) -------------------------------------------------------------------------------- Encounter Discharge Information Details Patient Name: Adam Merritt Date of Service: 08/14/2019 1:30 PM Medical Record Number: [id number] Patient Account Number: [id number] Date of Birth/Sex: 1945-06-27 (73 y.o. M) Treating RN: Army Melia Primary Care Marci Polito: [name] Other Clinician: Referring Liban Guedes: [name] Treating Pervis Macintyre/Extender: Melburn Hake, [name]: 4 Encounter Discharge Information Items Discharge Condition: Stable Ambulatory Status: Walker Discharge Destination: Home Transportation: Private Auto Accompanied By: wife Schedule Follow-up Appointment: Yes Clinical Summary of Care: Electronic Signature(s) Signed: 08/14/2019 3:43:50 PM By: Army Melia Entered By: Army Melia on 08/14/2019 14:00:17 Adam Merritt ([id number]) -------------------------------------------------------------------------------- Lower Extremity Assessment Details Patient Name: Adam Merritt Date of Service: 08/14/2019 1:30 PM Medical Record Number: [id number] Patient Account Number: [id number] Date of Birth/Sex: March 26, 1946 (73 y.o. M) Treating RN: Cornell Barman Primary Care Marticia Reifschneider: [name] Other Clinician: Referring Arien Morine: [name] Treating Nephi Savage/Extender: Melburn Hake, [name]: 4 Edema Assessment Assessed: [Left: No] [Right: No] [Left: Edema] [Right: :] Calf Left: Right: Point of Measurement: 34 cm From Medial Instep 53.5 cm cm Ankle Left: Right: Point of Measurement: 12 cm From Medial Instep 33 cm cm Vascular Assessment Pulses: Dorsalis Pedis Palpable: [Left:Yes] Electronic Signature(s) Signed: 08/14/2019 4:33:53 PM By: Gretta Cool, BSN, RN, CWS, Kim RN, BSN Entered By: Gretta Cool, BSN, RN, CWS, Kim on 08/14/2019 13:47:10 Adam Merritt ([id number]) -------------------------------------------------------------------------------- Multi Wound Chart Details Patient Name: Adam Merritt Date of Service: 08/14/2019 1:30 PM Medical Record Number: [id number] Patient Account Number: [id number] Date of Birth/Sex: December 29, 1945 (73 y.o. M) Treating RN: Army Melia Primary Care Kamrynn Melott: [name] Other Clinician: Referring Mert Dietrick: [name] Treating Cheron Pasquarelli/Extender: Melburn Hake, [name]: 4 Vital Signs Height(in): 64 Pulse(bpm): 90 Weight(lbs): 557 Blood Pressure(mmHg): 166/79 Body Mass Index(BMI): 47 Temperature(F): 98.7 Respiratory Rate(breaths/min): 18 Photos: [N/A:N/A] Wound Location: Left Lower Leg - Lateral N/A N/A Wounding Event: Gradually Appeared N/A N/A Primary Etiology: Lymphedema N/A N/A Comorbid History: Anemia, Lymphedema, Chronic N/A N/A Obstructive Pulmonary Disease (COPD), Sleep Apnea,  Congestive Heart Failure, Coronary Artery Disease, Hypertension, Peripheral Venous Disease, Type II Diabetes, Osteoarthritis, Neuropathy, Received Chemotherapy Date Acquired: 07/07/2019 N/A N/A Weeks of Treatment: 4 N/A N/A Wound Status: Healed - Epithelialized N/A N/A Measurements L x W x D (cm) 0x0x0 N/A N/A Area (cm) : 0 N/A N/A Volume (cm) : 0 N/A N/A % Reduction in Area: 100.00% N/A N/A % Reduction in Volume: 100.00% N/A N/A Classification:  Full Thickness Without Exposed N/A N/A Support Structures Exudate Amount: Medium N/A N/A Exudate Type: Serous N/A N/A Exudate Color: amber N/A N/A Wound Margin: Flat and Intact N/A N/A Granulation Amount: Large (67-100%) N/A N/A Granulation Quality: Pink N/A N/A Necrotic Amount: None Present (0%) N/A N/A Exposed Structures: Fat Layer (Subcutaneous Tissue) N/A N/A Exposed: Yes Fascia: No Tendon: No Muscle: No Joint: No Bone: No Epithelialization: Large (67-100%) N/A N/A Treatment Notes Adam Merritt, Adam Merritt ([id number]) Electronic Signature(s) Signed: 08/14/2019 3:43:50 PM By: Army Melia Entered By: Army Melia on 08/14/2019 13:58:22 Adam Merritt ([id number]) -------------------------------------------------------------------------------- Adam Merritt Details Patient Name: Adam Merritt Date of Service: 08/14/2019 1:30 PM Medical Record Number: [id number] Patient Account Number: [id number] Date of Birth/Sex: [birth year] (73 y.o. M) Treating RN: Army Melia Primary Care Mahealani Sulak: [name] Other Clinician: Referring Karsynn Deweese: [name] Treating Alexius Ellington/Extender: Melburn Hake, [name]: 4 Active Inactive Electronic Signature(s) Signed: 08/14/2019 3:43:50 PM By: Army Melia Entered By: Army Melia on 08/14/2019 13:58:13 Adam Merritt ([id number]) -------------------------------------------------------------------------------- Pain Assessment Details Patient Name: Adam Merritt Date of  Service: 08/14/2019 1:30 PM Medical Record Number: [id number] Patient Account Number: [id number] Date of Birth/Sex: 10-18-1945 (73 y.o. M) Treating RN: Cornell Barman Primary Care [employer]: [name] Other Clinician: Referring Klynn Linnemann: [name] Treating Wash Nienhaus/Extender: Melburn Hake, [name]: 4 Active Problems Location of Pain Severity and Description of Pain Patient Has Paino No Site Locations Pain Management and Medication Current Pain Management: Notes Patient denies pain at this time. Electronic Signature(s) Signed: 08/14/2019 4:33:53 PM By: Gretta Cool, BSN, RN, CWS, Kim RN, BSN Entered By: Gretta Cool, BSN, RN, CWS, Kim on 08/14/2019 13:44:27 Adam Merritt ([id number]) -------------------------------------------------------------------------------- Patient/Caregiver Education Details Patient Name: Adam Merritt Date of Service: 08/14/2019 1:30 PM Medical Record Number: [id number] Patient Account Number: [id number] Date of Birth/Gender: 10/04/1945 (73 y.o. M) Treating RN: Army Melia Primary Care Physician: [name] Other Clinician: Referring Physician: [name] Treating Physician/Extender: Sharalyn Ink in Treatment: 4 Education Assessment Education Provided To: Patient Education Topics Provided Wound/Skin Impairment: Handouts: Caring for Your Ulcer Methods: Demonstration, Explain/Verbal Responses: State content correctly Electronic Signature(s) Signed: 08/14/2019 3:43:50 PM By: Army Melia Entered By: Army Melia on 08/14/2019 13:59:54 Adam Merritt ([id number]) -------------------------------------------------------------------------------- Wound Assessment Details Patient Name: Adam Merritt Date of Service: 08/14/2019 1:30 PM Medical Record Number: [id number] Patient Account Number: [id number] Date of Birth/Sex: 07/20/1945 (73 y.o. M) Treating RN: Cornell Barman Primary Care Gina Costilla: [name] Other Clinician: Referring Neoma Uhrich: [name] Treating Jakhari Space/Extender: Melburn Hake, [name]: 4 Wound Status Wound Number: [id number] Primary Lymphedema Etiology: Wound Location: Left Lower Leg - Lateral Wound Healed - Epithelialized Wounding Event: Gradually Appeared Status: Date Acquired: 07/07/2019 Comorbid Anemia, Lymphedema, Chronic Obstructive Pulmonary Weeks Of Treatment: 4 History: Disease (COPD), Sleep Apnea, Congestive Heart Failure, Clustered Wound: No Coronary Artery Disease, Hypertension, Peripheral Venous Disease, Type II Diabetes, Osteoarthritis, Neuropathy, Received Chemotherapy Photos Wound Measurements Length: (cm) Width: (cm) Depth: (cm) Area: (cm) Volume: (cm) 0 % Reduction in Area: 100% 0 % Reduction in Volume: 100% 0 Epithelialization: Large (67-100%) 0 0 Wound Description Classification: Full Thickness Without Exposed Support Structu Wound Margin: Flat and Intact Exudate Amount: Medium Exudate Type: Serous Exudate Color: amber res Foul Odor After Cleansing: No Slough/Fibrino No Wound Bed Granulation Amount: Large (67-100%) Exposed Structure Granulation Quality: Pink Fascia Exposed: No Necrotic Amount: None Present (0%) Fat Layer (Subcutaneous Tissue) Exposed: Yes Tendon Exposed: No Muscle Exposed: No Joint Exposed: No Bone  Exposed: No Electronic Signature(s) Signed: 08/14/2019 4:33:53 PM By: Gretta Cool, BSN, RN, CWS, Kim RN, BSN Entered By: Gretta Cool, BSN, RN, CWS, Kim on 08/14/2019 13:45:53 Adam Merritt ([id number]) -------------------------------------------------------------------------------- [place] Details Patient Name: Adam Merritt Date of Service: 08/14/2019 1:30 PM Medical Record Number: [id number] Patient Account Number: [id number] Date of Birth/Sex: 08-06-1945 (73 y.o. M) Treating RN: Cornell Barman Primary Care Amanat Hackel: [name] Other Clinician: Referring Bonnie Overdorf: [name] Treating Eyvette Cordon/Extender: Melburn Hake, [name]: 4 Vital Signs Time  Taken: 13:40 Temperature (F): 98.7 Height (in): 66 Pulse (bpm): 90 Weight (lbs): 292 Respiratory Rate (breaths/min): 18 Body Mass Index (BMI): 47.1 Blood Pressure (mmHg): 166/79 Reference Range: 80 - 120 mg / dl Electronic Signature(s) Signed: 08/14/2019 4:33:53 PM By: Gretta Cool, BSN, RN, CWS, Kim RN, BSN Entered By: Gretta Cool, BSN, RN, CWS, Kim on 08/14/2019 13:42:03

## 2019-08-15 ENCOUNTER — Telehealth: Payer: Self-pay

## 2019-08-15 NOTE — Telephone Encounter (Signed)
CONFIRMED AND SCREENED FOR 08-19-19 OV.

## 2019-08-19 ENCOUNTER — Ambulatory Visit: Payer: TRICARE For Life (TFL) | Admitting: Nurse Practitioner

## 2019-08-21 ENCOUNTER — Telehealth: Payer: Self-pay

## 2019-08-21 NOTE — Telephone Encounter (Signed)
CONFIRMED AND SCREENED FOR 08-25-19 OV.

## 2019-08-25 ENCOUNTER — Ambulatory Visit (INDEPENDENT_AMBULATORY_CARE_PROVIDER_SITE_OTHER): Payer: Medicare PPO | Admitting: Nurse Practitioner

## 2019-08-25 ENCOUNTER — Other Ambulatory Visit: Payer: Self-pay

## 2019-08-25 ENCOUNTER — Encounter: Payer: Self-pay | Admitting: Nurse Practitioner

## 2019-08-25 VITALS — BP 113/48 | HR 70 | Temp 97.1°F | Resp 16 | Ht 66.0 in | Wt 320.0 lb

## 2019-08-25 DIAGNOSIS — I872 Venous insufficiency (chronic) (peripheral): Secondary | ICD-10-CM

## 2019-08-25 DIAGNOSIS — R609 Edema, unspecified: Secondary | ICD-10-CM | POA: Diagnosis not present

## 2019-08-25 DIAGNOSIS — E1142 Type 2 diabetes mellitus with diabetic polyneuropathy: Secondary | ICD-10-CM

## 2019-08-25 DIAGNOSIS — I10 Essential (primary) hypertension: Secondary | ICD-10-CM | POA: Diagnosis not present

## 2019-08-25 DIAGNOSIS — J449 Chronic obstructive pulmonary disease, unspecified: Secondary | ICD-10-CM

## 2019-08-25 DIAGNOSIS — E1165 Type 2 diabetes mellitus with hyperglycemia: Secondary | ICD-10-CM | POA: Diagnosis not present

## 2019-08-25 DIAGNOSIS — R600 Localized edema: Secondary | ICD-10-CM

## 2019-08-25 LAB — POCT GLYCOSYLATED HEMOGLOBIN (HGB A1C): Hemoglobin A1C: 6.6 % — AB (ref 4.0–5.6)

## 2019-08-25 MED ORDER — GABAPENTIN 300 MG PO CAPS: ORAL_CAPSULE | ORAL | 3 refills | Status: DC

## 2019-08-25 MED ORDER — ALBUTEROL SULFATE HFA 108 (90 BASE) MCG/ACT IN AERS: 2.0000 | INHALATION_SPRAY | Freq: Four times a day (QID) | RESPIRATORY_TRACT | 5 refills | Status: DC | PRN

## 2019-08-25 NOTE — Progress Notes (Signed)
[facility] [place], [place] [address]  Internal MEDICINE  Office Visit Note  Patient Name: Adam Merritt  [id number]  [id number]  Date of Service: 08/25/2019  Chief Complaint  Patient presents with  . Diabetes  . Hyperlipidemia  . Hypertension  . Gastroesophageal Reflux  . Anemia    The patient is here for routine follow up. Blood sugars are well controlled. Hgba1c 6.6 today, up slightly from 6.2 at last check. Will need different basal insulin as insurance will no longer pay for basaglar. Blood pressure slightly lower than normal for him this morning. States that he feels a little bad. Does not feel like he is getting sick, just aving a bad day. Does need to have refills for gabapentin and will need new prescription for basal insulin.       Current Medication: Outpatient Encounter Medications as of 08/25/2019  Medication Sig  . albuterol (VENTOLIN HFA) 108 (90 Base) MCG/ACT inhaler Inhale 2 puffs into the lungs every 6 (six) hours as needed for wheezing or shortness of breath.  Marland Kitchen aspirin 325 MG EC tablet Take 325 mg by mouth daily.  Marland Kitchen atorvastatin (LIPITOR) 40 MG tablet Take 40 mg by mouth at bedtime.   . carvedilol (COREG) 25 MG tablet Take 25 mg by mouth 2 (two) times daily with a meal.  . cefUROXime (CEFTIN) 500 MG tablet Take 1 tablet (500 mg total) by mouth 2 (two) times daily with a meal.  . docusate sodium (COLACE) 50 MG capsule Take 50 mg by mouth 2 (two) times daily.  . febuxostat (ULORIC) 40 MG tablet Take 1 tablet (40 mg total) by mouth daily.  . furosemide (LASIX) 40 MG tablet Take 1 tablet (40 mg total) by mouth daily.  Marland Kitchen gabapentin (NEURONTIN) 300 MG capsule Take 2 cap in the morning, 1 cap in the evening and 2 cap at night.  . hydrALAZINE (APRESOLINE) 25 MG tablet   . indapamide (LOZOL) 2.5 MG tablet Take 1 tablet (2.5 mg total) by mouth daily.  . Insulin Glargine (BASAGLAR KWIKPEN) 100 UNIT/ML SOPN INJECT 30 TO 36 UNITS UNDER THE SKIN  EVERY DAY  . Insulin Pen Needle (BD PEN NEEDLE NANO U/F) 32G X 4 MM MISC Use as directed with insulin DX e11.65  . ipratropium-albuterol (DUONEB) 0.5-2.5 (3) MG/3ML SOLN Take 3 mLs by nebulization every 4 (four) hours as needed (for shortness of breath).  . metolazone (ZAROXOLYN) 2.5 MG tablet   . Multiple Vitamin (MULTIVITAMIN WITH MINERALS) TABS tablet Take 1 tablet by mouth daily.  . ondansetron (ZOFRAN) 4 MG tablet Take 1 tablet (4 mg total) by mouth every 8 (eight) hours as needed.  [name] 62I MISC Use twice daily diag e11.65  . pentoxifylline (TRENTAL) 400 MG CR tablet TAKE 1 TABLET BY MOUTH THREE TIMES DAILY FOR CLAUDICATION  . potassium chloride SA (KLOR-CON) 20 MEQ tablet Take 1 tablet (20 mEq total) by mouth 2 (two) times daily.  . prazosin (MINIPRESS) 2 MG capsule Take 2 mg by mouth at bedtime.   . sennosides-docusate sodium (SENOKOT-S) 8.6-50 MG tablet Take 1-2 tablets by mouth 2 (two) times daily.  . sertraline (ZOLOFT) 100 MG tablet   . traZODone (DESYREL) 50 MG tablet Take 50 mg by mouth at bedtime.  Marland Kitchen VICTOZA 18 MG/3ML SOPN INJECT 1.8MG  INTO THE SKIN DAILY  . [DISCONTINUED] albuterol (PROVENTIL HFA;VENTOLIN HFA) 108 (90 Base) MCG/ACT inhaler INHALE 1 PUFF INTO THE LUNGS EVERY 6 HOURS AS NEEDED FOR WHEEZING  OR SHORTNESS OF BREATH (Patient taking differently: Inhale 1 puff into the lungs every 6 (six) hours as needed for wheezing or shortness of breath. )  . [DISCONTINUED] gabapentin (NEURONTIN) 300 MG capsule Take 2 cap in the morning, 1 cap in the evening and 2 cap at night.   No facility-administered encounter medications on file as of 08/25/2019.    Surgical History: Past Surgical History:  Procedure Laterality Date  . 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 CHOLECYSTECTOMY    . CORONARY ARTERY BYPASS GRAFT      Medical History: Past Medical  History:  Diagnosis Date  . Anemia   . CAD (coronary artery disease)   . Chest pain   . Coronary artery disease   . Diabetes mellitus without complication ([place])   . Diastolic CHF ([place])   . Esophageal reflux   . Herpes zoster without mention of complication   . Hyperlipidemia   . Hypertension   . Insomnia   . Lumbago   . Lymphedema   . Neuropathy in diabetes ([place])   . Obstructive chronic bronchitis without exacerbation ([place])   . Other malaise and fatigue   . Stroke ([place])   . Varicose veins     Family History: Family History  Problem Relation Age of Onset  . Diabetes Mother   . Hyperlipidemia Mother   . Hypertension Mother   . Diabetes Father   . Hyperlipidemia Father   . Hypertension Father   . CAD Father     Social History   Socioeconomic History  . Marital status: Married    Spouse name: Not on file  . Number of children: Not on file  . Years of education: Not on file  . Highest education level: Not on file  Occupational History  . Occupation: retired  Tobacco Use  . Smoking status: Never Smoker  . Smokeless tobacco: Never Used  Substance and Sexual Activity  . Alcohol use: No    Comment: quit alcohol 30 years ago  . Drug use: No  . Sexual activity: Not Currently  Other Topics Concern  . Not on file  Social History Narrative   Lives at home with wife, uses a wheelchair now   Social Determinants of Health   Financial Resource Strain: Low Risk   . Difficulty of Paying Living Expenses: Not hard at all  Food Insecurity: No Food Insecurity  . Worried About Charity fundraiser in the Last Year: Never true  . Ran Out of Food in the Last Year: Never true  Transportation Needs: No Transportation Needs  . Lack of Transportation (Medical): No  . Lack of Transportation (Non-Medical): No  Physical Activity: Inactive  . Days of Exercise per Week: 0 days  . Minutes of Exercise per Session: 0 min  Stress: No Stress Concern Present  . Feeling of Stress : Only a  little  Social Connections: Slightly Isolated  . Frequency of Communication with Friends and Family: More than three times a week  . Frequency of Social Gatherings with Friends and Family: More than three times a week  . Attends Religious Services: More than 4 times per year  . Active Member of Clubs or Organizations: No  . Attends Archivist Meetings: Never  . Marital Status: Married  Human resources officer Violence: Not At Risk  . Fear of Current or Ex-Partner: No  . Emotionally Abused:  No  . Physically Abused: No  . Sexually Abused: No      Review of Systems  Constitutional: Positive for fatigue. Negative for activity change.  HENT: Positive for rhinorrhea. Negative for congestion and postnasal drip.   Respiratory: Positive for shortness of breath. Negative for cough and chest tightness.        States that his breathing has been doing well   Cardiovascular: Positive for leg swelling. Negative for chest pain and palpitations.       Stable leg swelling.   Gastrointestinal: Negative for constipation, diarrhea, nausea and vomiting.  Endocrine: Negative for cold intolerance, heat intolerance, polydipsia and polyuria.       Blood sugars doing well   Musculoskeletal: Positive for arthralgias, back pain and myalgias.  Skin: Positive for rash and wound.       Patient scheduled to see his wound care specialist later today.   Allergic/Immunologic: Positive for environmental allergies.  Neurological: Negative for dizziness, light-headedness and headaches.  Hematological: Negative for adenopathy.  Psychiatric/Behavioral: Positive for dysphoric mood. Negative for agitation. The patient is nervous/anxious.        Improved. Now treated [facility] for anxiety/depression.    Today's Vitals   08/25/19 0909  BP: (!) 113/48  Pulse: 70  Resp: 16  Temp: (!) 97.1 F (36.2 C)  SpO2: 98%  Weight: (!) 320 lb (145.2 kg)  Height: 5\' 6"  (1.676 m)   Body mass index is 51.65 kg/m.  Physical  Exam Vitals and nursing note reviewed.  Constitutional:      General: He is not in acute distress.    Appearance: Normal appearance. He is well-developed. He is obese. He is not diaphoretic.  HENT:     Head: Normocephalic and atraumatic.     Nose: Nose normal.     Mouth/Throat:     Pharynx: No oropharyngeal exudate.  Eyes:     Pupils: Pupils are equal, round, and reactive to light.  Neck:     Thyroid: No thyromegaly.     Vascular: No JVD.     Trachea: No tracheal deviation.  Cardiovascular:     Rate and Rhythm: Normal rate and regular rhythm.     Heart sounds: Normal heart sounds. No murmur. No friction rub. No gallop.      Comments: Bilateral lower leg edema. Tender to palpate.  Pulmonary:     Effort: Pulmonary effort is normal. No respiratory distress.     Breath sounds: Normal breath sounds. No wheezing or rales.  Chest:     Chest wall: No tenderness.  Abdominal:     Palpations: Abdomen is soft.  Musculoskeletal:        General: Normal range of motion.     Cervical back: Normal range of motion and neck supple.  Lymphadenopathy:     Cervical: No cervical adenopathy.  Skin:    General: Skin is warm and dry.  Neurological:     Mental Status: He is alert and oriented to person, place, and time. Mental status is at baseline.     Cranial Nerves: No cranial nerve deficit.  Psychiatric:        Mood and Affect: Mood normal.        Behavior: Behavior normal.        Thought Content: Thought content normal.        Judgment: Judgment normal.    Assessment/Plan: 1. Uncontrolled type 2 diabetes mellitus with hyperglycemia (HCC) - POCT HgB A1C 6.6 today. Continue diabetic medication as prescribed.  Will change prescription for basal insulin as indicated.   2. Chronic obstructive pulmonary disease, unspecified COPD type ([place]) Generally well managed. Continue inhalers and respiratory medication as prescribed  - albuterol (VENTOLIN HFA) 108 (90 Base) MCG/ACT inhaler; Inhale 2 puffs  into the lungs every 6 (six) hours as needed for wheezing or shortness of breath.  Dispense: 18 g; Refill: 5  3. Diabetic polyneuropathy associated with type 2 diabetes mellitus (HCC) Take gabapentin as prescribed. Refills sent to his pharmacy today.  - gabapentin (NEURONTIN) 300 MG capsule; Take 2 cap in the morning, 1 cap in the evening and 2 cap at night.  Dispense: 180 capsule; Refill: 3  4. Essential hypertension Stable. Continue bp medication as prescribed   5. Edema of both lower legs due to peripheral venous insufficiency Regular visits with wound care and vascular providers as scheduled.   General Counseling: Adam Merritt verbalizes understanding of the findings of todays visit and agrees with plan of treatment. I have discussed any further diagnostic evaluation that may be needed or ordered today. We also reviewed his medications today. he has been encouraged to call the office with any questions or concerns that should arise related to todays visit.  Diabetes Counseling:  1. Addition of ACE inh/ ARB'S for nephroprotection. Microalbumin is updated  2. Diabetic foot care, prevention of complications. Podiatry consult 3. Exercise and lose weight.  4. Diabetic eye examination, Diabetic eye exam is updated  5. Monitor blood sugar closlely. nutrition counseling.  6. Sign and symptoms of hypoglycemia including shaking sweating,confusion and headaches.  This patient was seen by [name] FNP Collaboration with Dr [name] as a part of collaborative care agreement  Orders Placed This Encounter  Procedures  . POCT HgB A1C    Meds ordered this encounter  Medications  . gabapentin (NEURONTIN) 300 MG capsule    Sig: Take 2 cap in the morning, 1 cap in the evening and 2 cap at night.    Dispense:  180 capsule    Refill:  3    Ok to fill this prescription early. Thanks.    Order Specific Question:   Supervising Provider    Answer:   [name] [[id number]]  . albuterol (VENTOLIN HFA)  108 (90 Base) MCG/ACT inhaler    Sig: Inhale 2 puffs into the lungs every 6 (six) hours as needed for wheezing or shortness of breath.    Dispense:  18 g    Refill:  5    Order Specific Question:   Supervising Provider    Answer:   [name] [[id number]]    Total time spent: 30 Minutes   Time spent includes review of chart, medications, test results, and follow up plan with the patient.      Dr [name] Internal medicine

## 2019-09-05 DIAGNOSIS — I509 Heart failure, unspecified: Secondary | ICD-10-CM | POA: Diagnosis not present

## 2019-09-22 ENCOUNTER — Other Ambulatory Visit: Payer: Self-pay

## 2019-09-22 DIAGNOSIS — E1165 Type 2 diabetes mellitus with hyperglycemia: Secondary | ICD-10-CM

## 2019-09-22 MED ORDER — VICTOZA 18 MG/3ML ~~LOC~~ SOPN: PEN_INJECTOR | SUBCUTANEOUS | 3 refills | Status: DC

## 2019-09-26 ENCOUNTER — Other Ambulatory Visit: Payer: Self-pay

## 2019-09-26 ENCOUNTER — Encounter: Payer: Medicare PPO | Attending: Internal Medicine | Admitting: Internal Medicine

## 2019-09-26 DIAGNOSIS — L97519 Non-pressure chronic ulcer of other part of right foot with unspecified severity: Secondary | ICD-10-CM | POA: Diagnosis not present

## 2019-09-26 DIAGNOSIS — I872 Venous insufficiency (chronic) (peripheral): Secondary | ICD-10-CM | POA: Insufficient documentation

## 2019-09-26 DIAGNOSIS — J449 Chronic obstructive pulmonary disease, unspecified: Secondary | ICD-10-CM | POA: Insufficient documentation

## 2019-09-26 DIAGNOSIS — I509 Heart failure, unspecified: Secondary | ICD-10-CM | POA: Diagnosis not present

## 2019-09-26 DIAGNOSIS — L97812 Non-pressure chronic ulcer of other part of right lower leg with fat layer exposed: Secondary | ICD-10-CM | POA: Diagnosis not present

## 2019-09-26 DIAGNOSIS — I890 Lymphedema, not elsewhere classified: Secondary | ICD-10-CM | POA: Insufficient documentation

## 2019-09-26 DIAGNOSIS — E669 Obesity, unspecified: Secondary | ICD-10-CM | POA: Diagnosis not present

## 2019-09-26 DIAGNOSIS — I110 Hypertensive heart disease with heart failure: Secondary | ICD-10-CM | POA: Insufficient documentation

## 2019-09-26 DIAGNOSIS — E11621 Type 2 diabetes mellitus with foot ulcer: Secondary | ICD-10-CM | POA: Insufficient documentation

## 2019-09-26 DIAGNOSIS — E11622 Type 2 diabetes mellitus with other skin ulcer: Secondary | ICD-10-CM | POA: Diagnosis not present

## 2019-09-26 DIAGNOSIS — L97222 Non-pressure chronic ulcer of left calf with fat layer exposed: Secondary | ICD-10-CM | POA: Insufficient documentation

## 2019-09-26 NOTE — Progress Notes (Signed)
Adam, Merritt ([id number]) Visit Report for 09/26/2019 Abuse/Suicide Risk Screen Details Patient Name: Adam, HUSTEAD Date of Service: 09/26/2019 9:45 AM Medical Record Number: [id number] Patient Account Number: [id number] Date of Birth/Sex: Oct 17, 1945 (75 y.o. M) Treating RN: Adam Merritt Primary Care Adam Merritt: Adam Merritt Other Clinician: Referring Adam Merritt: Referral, Self Treating Adam Merritt/Extender: Adam Merritt in Treatment: 0 Abuse/Suicide Risk Screen Items Answer ABUSE RISK SCREEN: Has anyone close to you tried to hurt or harm you recentlyo No Do you feel uncomfortable with anyone in your familyo No Has anyone forced you do things that you didnot want to doo No Electronic Signature(s) Signed: 09/26/2019 11:45:23 AM By: Adam Merritt Entered By: Adam Merritt on 09/26/2019 09:35:14 Adam Merritt ([id number]) -------------------------------------------------------------------------------- Activities of Daily Living Details Patient Name: Adam Merritt Date of Service: 09/26/2019 9:45 AM Medical Record Number: [id number] Patient Account Number: [id number] Date of Birth/Sex: [birth year] (74 y.o. M) Treating RN: Adam Merritt Primary Care Lakeyshia Tuckerman: Adam Merritt Other Clinician: Referring Galadriel Shroff: Referral, Self Treating Gonzalo Waymire/Extender: Adam Merritt in Treatment: 0 Activities of Daily Living Items Answer Activities of Daily Living (Please select one for each item) Drive Automobile Not Able Take Medications Completely Able Use Telephone Completely Able Care for Appearance Completely Able Use Toilet Completely Able Bath / Shower Need Assistance Dress Self Completely Able Feed Self Completely Able Walk Need Assistance Get In / Out Bed Completely Able Housework Need Assistance Prepare Meals Completely [place] for Self Completely Able Electronic Signature(s) Signed: 09/26/2019 11:45:23 AM By: Adam Merritt Entered By: Adam Merritt on 09/26/2019 09:35:34 Adam Merritt ([id number]) -------------------------------------------------------------------------------- Education Screening Details Patient Name: Adam Merritt Date of Service: 09/26/2019 9:45 AM Medical Record Number: [id number] Patient Account Number: [id number] Date of Birth/Sex: [birth year] (74 y.o. M) Treating RN: Adam Merritt Primary Care Keisa Blow: Adam Merritt Other Clinician: Referring Gerron Guidotti: Referral, Self Treating Kelissa Merlin/Extender: Adam Merritt in Treatment: 0 Primary Learner Assessed: Patient Learning Preferences/Education Level/Primary Language Learning Preference: Explanation Highest Education Level: High School Preferred Language: English Cognitive Barrier Language Barrier: No Translator Needed: No Memory Deficit: No Emotional Barrier: No Cultural/Religious Beliefs Affecting Medical Care: No Physical Barrier Impaired Vision: No Impaired Hearing: No Decreased Hand dexterity: No Knowledge/Comprehension Knowledge Level: High Comprehension Level: High Ability to understand written instructions: High Ability to understand verbal instructions: High Motivation Anxiety Level: Calm Cooperation: Cooperative Education Importance: Acknowledges Need Interest in Health Problems: Asks Questions Perception: Coherent Willingness to Engage in Self-Management High Activities: Readiness to Engage in Self-Management High Activities: Electronic Signature(s) Signed: 09/26/2019 11:45:23 AM By: Adam Merritt Entered By: Adam Merritt on 09/26/2019 09:35:58 Adam Merritt ([id number]) -------------------------------------------------------------------------------- Fall Risk Assessment Details Patient Name: Adam Merritt Date of Service: 09/26/2019 9:45 AM Medical Record Number: [id number] Patient Account Number: [id number] Date of Birth/Sex: [birth year] (74 y.o. M) Treating RN: Adam Merritt Primary Care Kambrey Hagger: Adam Merritt Other  Clinician: Referring Gesenia Bantz: Referral, Self Treating Keundra Petrucelli/Extender: Adam Merritt in Treatment: 0 Fall Risk Assessment Items Have you had 2 or more falls in the last 12 monthso 0 No Have you had any fall that resulted in injury in the last 12 monthso 0 No FALLS RISK SCREEN History of falling - immediate or within 3 months 0 No Secondary diagnosis (Do you have 2 or more medical diagnoseso) 0 No Ambulatory aid None/bed rest/wheelchair/nurse 0 No Crutches/cane/walker 0 No Furniture 0 No Intravenous therapy Access/Saline/Heparin Lock 0 No Gait/Transferring Normal/ bed rest/ wheelchair 0 No Weak (short steps with  or without shuffle, stooped but able to lift head while walking, may seek 0 No support from furniture) Impaired (short steps with shuffle, may have difficulty arising from chair, head down, impaired 0 No balance) Mental Status Oriented to own ability 0 No Electronic Signature(s) Signed: 09/26/2019 11:45:23 AM By: Adam Merritt Entered By: Adam Merritt on 09/26/2019 09:36:03 Adam Merritt ([id number]) -------------------------------------------------------------------------------- Foot Assessment Details Patient Name: Adam Merritt Date of Service: 09/26/2019 9:45 AM Medical Record Number: [id number] Patient Account Number: [id number] Date of Birth/Sex: Jun 13, 1945 (74 y.o. M) Treating RN: Adam Merritt Primary Care Aisea Bouldin: Adam Merritt Other Clinician: Referring Stefhanie Kachmar: Referral, Self Treating Sheza Strickland/Extender: Adam Merritt in Treatment: 0 Foot Assessment Items Site Locations + = Sensation present, - = Sensation absent, C = Callus, U = Ulcer R = Redness, W = Warmth, M = Maceration, PU = Pre-ulcerative lesion F = Fissure, S = Swelling, D = Dryness Assessment Right: Left: Other Deformity: No No Prior Foot Ulcer: No No Prior Amputation: No No Charcot Joint: No No Ambulatory Status: Ambulatory With Help Assistance Device: Cane Gait:  Steady Electronic Signature(s) Signed: 09/26/2019 11:45:23 AM By: Adam Merritt Entered By: Adam Merritt on 09/26/2019 09:37:08 Adam Merritt ([id number]) -------------------------------------------------------------------------------- Nutrition Risk Screening Details Patient Name: Adam Merritt Date of Service: 09/26/2019 9:45 AM Medical Record Number: [id number] Patient Account Number: [id number] Date of Birth/Sex: [birth year] (74 y.o. M) Treating RN: Adam Merritt Primary Care Zyanne Schumm: Adam Merritt Other Clinician: Referring Mohannad Olivero: Referral, Self Treating Aina Rossbach/Extender: Adam Merritt in Treatment: 0 Height (in): Weight (lbs): Body Mass Index (BMI): Nutrition Risk Screening Items Score Screening NUTRITION RISK SCREEN: I have an illness or condition that made me change the kind and/or amount of food I eat 0 No I eat fewer than two meals per day 0 No I eat few fruits and vegetables, or milk products 0 No I have three or more drinks of beer, liquor or wine almost every day 0 No I have tooth or mouth problems that make it hard for me to eat 0 No I don't always have enough money to buy the food I need 0 No I eat alone most of the time 0 No I take three or more different prescribed or over-the-counter drugs a day 0 No Without wanting to, I have lost or gained 10 pounds in the last six months 0 No I am not always physically able to shop, cook and/or feed myself 0 No Nutrition Protocols Good Risk Protocol 0 No interventions needed Moderate Risk Protocol High Risk Proctocol Risk Level: Good Risk Score: 0 Electronic Signature(s) Signed: 09/26/2019 11:45:23 AM By: Adam Merritt Entered By: Adam Merritt on 09/26/2019 09:36:08

## 2019-09-26 NOTE — Progress Notes (Signed)
DAMEK, ENDE ([id number]) Visit Report for 09/26/2019 Allergy List Details Patient Name: Adam Merritt Date of Service: 09/26/2019 9:45 AM Medical Record Number: [id number] Patient Account Number: [id number] Date of Birth/Sex: Oct 30, 1945 (74 y.o. M) Treating RN: Adam Merritt Primary Care Adam Merritt: Adam Merritt Other Clinician: Referring Adam Merritt: Referral, Self Treating Adam Merritt/Adam Merritt: Adam Merritt Adam Merritt: 0 Allergies Active Allergies Corticosteroids (Glucocorticoids) Allergy Notes Electronic Signature(s) Signed: 09/26/2019 11:45:23 AM Merritt: Adam Merritt Entered Merritt: Adam Merritt on 09/26/2019 09:33:36 Adam Merritt ([id number]) -------------------------------------------------------------------------------- Braden Details Patient Name: Adam Merritt Date of Service: 09/26/2019 9:45 AM Medical Record Number: [id number] Patient Account Number: [id number] Date of Birth/Sex: 1946/01/28 (74 y.o. M) Treating RN: Adam Merritt Primary Care Adam Merritt: Adam Merritt Other Clinician: Referring Adam Merritt: Referral, Self Treating Adam Merritt/Adam Merritt: Adam Merritt: 0 Visit Information Patient Arrived: Cane Arrival Time: 09:32 Accompanied Merritt: wife Transfer Assistance: None Patient Identification Verified: Yes Patient Has Alerts: Yes Patient Alerts: Patient on Blood Thinner DMII Aspirin 325 History Since Last Visit Added or deleted any medications: No Any new allergies or adverse reactions: No Had a fall or experienced change in activities of daily living that may affect risk of falls: No Signs or symptoms of abuse/neglect since last visito No Hospitalized since last visit: No Has Dressing in Place as Prescribed: Yes Electronic Signature(s) Signed: 09/26/2019 10:30:31 AM Merritt: Adam Merritt: Adam Merritt on 09/26/2019 10:30:31 Adam Merritt  ([id number]) -------------------------------------------------------------------------------- Clinic Level of Care Assessment Details Patient Name: Adam Merritt Date of Service: 09/26/2019 9:45 AM Medical Record Number: [id number] Patient Account Number: [id number] Date of Birth/Sex: Nov 19, 1945 (74 y.o. M) Treating RN: Adam Merritt Primary Care Adam Merritt: Adam Merritt Other Clinician: Referring Adam Merritt: Referral, Self Treating Adam Merritt/Adam Merritt: Adam Merritt: 0 Clinic Level of Care Assessment Items TOOL 1 Quantity Score []  - Use when EandM and Procedure is performed on INITIAL visit 0 ASSESSMENTS - Nursing Assessment / Reassessment X - General Physical Exam (combine w/ comprehensive assessment (listed just below) when performed on new pt. 1 20 evals) X- 1 25 Comprehensive Assessment (HX, ROS, Risk Assessments, Wounds Hx, etc.) ASSESSMENTS - Wound and Skin Assessment / Reassessment []  - Dermatologic / Skin Assessment (not related to wound area) 0 ASSESSMENTS - Ostomy and/or Continence Assessment and Care []  - Incontinence Assessment and Management 0 []  - 0 Ostomy Care Assessment and Management (repouching, etc.) PROCESS - Coordination of Care X - Simple Patient / Family Education for ongoing care 1 15 []  - 0 Complex (extensive) Patient / Family Education for ongoing care X- 1 10 Staff obtains Programmer, systems, Records, Test Results / Process Orders []  - 0 Staff telephones HHA, Nursing Homes / Clarify orders / etc []  - 0 Routine Transfer to another Facility (non-emergent condition) []  - 0 Routine Hospital Admission (non-emergent condition) X- 1 15 New Admissions / Biomedical engineer / Ordering NPWT, Apligraf, etc. []  - 0 Emergency Hospital Admission (emergent condition) PROCESS - Special Needs []  - Pediatric / Minor Patient Management 0 []  - 0 Isolation Patient Management []  - 0 Hearing / Language / Visual special needs []  - 0 Assessment of  Community assistance (transportation, D/C planning, etc.) []  - 0 Additional assistance / Altered mentation []  - 0 Support Surface(s) Assessment (bed, cushion, seat, etc.) INTERVENTIONS - Miscellaneous []  - External ear exam 0 []  - 0 Patient Transfer (multiple staff / Civil Service fast streamer / Similar devices) []  - 0 Simple Staple / Suture removal (25 or less) []  - 0  Complex Staple / Suture removal (26 or more) []  - 0 Hypo/Hyperglycemic Management (do not check if billed separately) Adam Merritt ([id number]) []  - 0 Ankle / Brachial Index (ABI) - do not check if billed separately Has the patient been seen at the hospital within the last three years: Yes Total Score: 85 Level Of Care: New/Established - Level 3 Electronic Signature(s) Signed: 09/26/2019 12:04:24 PM Merritt: Adam Merritt: Adam Merritt on 09/26/2019 10:24:09 Adam Merritt ([id number]) -------------------------------------------------------------------------------- Compression Therapy Details Patient Name: Adam Merritt Date of Service: 09/26/2019 9:45 AM Medical Record Number: [id number] Patient Account Number: [id number] Date of Birth/Sex: 12-08-1945 (74 y.o. M) Treating RN: Adam Merritt Primary Care Adam Merritt: Adam Merritt Other Clinician: Referring Adam Merritt: Referral, Self Treating Adam Merritt/Adam Merritt: Adam Merritt: 0 Compression Therapy Performed for Wound Assessment: Wound #31 Right,Anterior Lower Leg Performed Merritt: Clinician Adam Hora, RN Compression Type: Four Layer Pre Merritt ABI: 1.2 Post Procedure Diagnosis Same as Pre-procedure Electronic Signature(s) Signed: 09/26/2019 12:04:24 PM Merritt: Adam Merritt: Adam Merritt on 09/26/2019 10:21:25 Adam Merritt ([id number]) -------------------------------------------------------------------------------- Compression Therapy Details Patient Name: Adam Merritt Date of Service: 09/26/2019 9:45 AM Medical Record Number:  [id number] Patient Account Number: [id number] Date of Birth/Sex: December 04, 1945 (74 y.o. M) Treating RN: Adam Merritt Primary Care Adam Merritt: Adam Merritt Other Clinician: Referring Adam Merritt: Referral, Self Treating Adam Merritt/Adam Merritt: Adam Merritt: 0 Compression Therapy Performed for Wound Assessment: Wound #32 Left,Lateral Lower Leg Performed Merritt: Clinician Adam Hora, RN Compression Type: Four Layer Pre Merritt ABI: 1.2 Post Procedure Diagnosis Same as Pre-procedure Electronic Signature(s) Signed: 09/26/2019 12:04:24 PM Merritt: Adam Merritt: Adam Merritt on 09/26/2019 10:21:25 Adam Merritt ([id number]) -------------------------------------------------------------------------------- Compression Therapy Details Patient Name: Adam Merritt Date of Service: 09/26/2019 9:45 AM Medical Record Number: [id number] Patient Account Number: [id number] Date of Birth/Sex: 03/26/1946 (74 y.o. M) Treating RN: Adam Merritt Primary Care Ritamarie Arkin: Adam Merritt Other Clinician: Referring Danyell Awbrey: Referral, Self Treating Jaquia Benedicto/Adam Merritt: Adam Merritt: 0 Compression Therapy Performed for Wound Assessment: Wound #33 Left,Medial Lower Leg Performed Merritt: Clinician Adam Hora, RN Compression Type: Four Layer Pre Merritt ABI: 1.2 Post Procedure Diagnosis Same as Pre-procedure Electronic Signature(s) Signed: 09/26/2019 12:04:24 PM Merritt: Adam Merritt: Adam Merritt on 09/26/2019 10:21:25 Adam Merritt ([id number]) -------------------------------------------------------------------------------- Compression Therapy Details Patient Name: Adam Merritt Date of Service: 09/26/2019 9:45 AM Medical Record Number: [id number] Patient Account Number: [id number] Date of Birth/Sex: Jun 24, 1945 (74 y.o. M) Treating RN: Adam Merritt Primary Care Jo Cerone: Adam Merritt Other Clinician: Referring Dezaray Shibuya: Referral, Self Treating  Vaiden Adames/Adam Merritt: Adam Merritt: 0 Compression Therapy Performed for Wound Assessment: Wound #34 Left,Posterior Lower Leg Performed Merritt: Clinician Adam Hora, RN Compression Type: Four Layer Pre Merritt ABI: 1.2 Post Procedure Diagnosis Same as Pre-procedure Electronic Signature(s) Signed: 09/26/2019 12:04:24 PM Merritt: Adam Merritt: Adam Merritt on 09/26/2019 10:21:26 Adam Merritt ([id number]) -------------------------------------------------------------------------------- Encounter Discharge Information Details Patient Name: Adam Merritt Date of Service: 09/26/2019 9:45 AM Medical Record Number: [id number] Patient Account Number: [id number] Date of Birth/Sex: 04-12-1946 (74 y.o. M) Treating RN: Adam Merritt Primary Care Faria Casella: Adam Merritt Other Clinician: Referring Maicie Vanderloop: Referral, Self Treating Oshen Wlodarczyk/Adam Merritt: Adam Merritt: 0 Encounter Discharge Information Items Discharge Condition: Stable Ambulatory Status: Cane Discharge Destination: Home Transportation: Private Auto Accompanied Merritt: spouse Schedule Follow-up Appointment: Yes Clinical Summary of Care: Electronic Signature(s) Signed: 09/26/2019 12:04:24 PM Merritt: Adam Merritt: Adam Merritt on  09/26/2019 10:25:45 ZYRON, DEELEY ([id number]) -------------------------------------------------------------------------------- Lower Extremity Assessment Details Patient Name: JT, BRABEC Date of Service: 09/26/2019 9:45 AM Medical Record Number: [id number] Patient Account Number: [id number] Date of Birth/Sex: 1945-10-09 (74 y.o. M) Treating RN: Adam Merritt Primary Care Aedan Geimer: Adam Merritt Other Clinician: Referring Armin Yerger: Referral, Self Treating Kendre Jacinto/Adam Merritt: Adam Merritt: 0 Edema Assessment Assessed: [Left: No] [Right: No] Edema: [Left: Yes] [Right: Yes] Calf Left: Right: Point of Measurement: 31 cm  From Medial Instep 58 cm 48 cm Ankle Left: Right: Point of Measurement: 11 cm From Medial Instep 38 cm 30 cm Vascular Assessment Pulses: Dorsalis Pedis Palpable: [Left:Yes] [Right:Yes] Electronic Signature(s) Signed: 09/26/2019 11:45:23 AM Merritt: Adam Merritt Entered Merritt: Adam Merritt on 09/26/2019 09:46:25 Adam Merritt ([id number]) -------------------------------------------------------------------------------- Multi Wound Chart Details Patient Name: Adam Merritt Date of Service: 09/26/2019 9:45 AM Medical Record Number: [id number] Patient Account Number: [id number] Date of Birth/Sex: 1945-10-31 (74 y.o. 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 Extremity Extremity Comorbid History: Anemia, Lymphedema, Chronic Anemia, Lymphedema, Chronic Anemia, Lymphedema, Chronic Obstructive Pulmonary Disease Obstructive Pulmonary Disease Obstructive Pulmonary Disease (COPD), Sleep Apnea, Congestive (COPD), Sleep Apnea, Congestive (COPD), Sleep Apnea, Congestive Heart Failure, Coronary Artery Heart Failure, Coronary Artery Heart Failure, Coronary Artery Disease, Hypertension, Peripheral Disease, Hypertension, Peripheral Disease, Hypertension, Peripheral Venous Disease, Type II Diabetes, Venous Disease, Type II Diabetes, Venous Disease, Type II Diabetes, Osteoarthritis, Neuropathy, Received Osteoarthritis, Neuropathy, Received  Osteoarthritis, Neuropathy, Received Chemotherapy Chemotherapy Chemotherapy Date Acquired: 09/18/2019 09/18/2019 09/18/2019 Adam of Merritt: 0 0 0 Wound Status: Open Open Open Measurements L x W x D (cm) 2x7x0.1 6.5x10x0.1 3.5x1x0.1 Area (cm) : 10.996 51.051 2.749 Volume (cm) : 1.1 5.105 0.275 Classification: Grade 2 Grade 2 Grade 2 Exudate Amount: Medium Medium Medium Exudate Type: Serosanguineous Serosanguineous Serosanguineous Exudate Color: red, brown red, brown red, brown Wound Margin: N/A N/A Flat and Intact Granulation Amount: Medium (34-66%) Medium (34-66%) Medium (34-66%) Granulation Quality: Red Red Red Necrotic Amount: Medium (34-66%) Medium (34-66%) Medium (34-66%) Exposed Structures: Fat Layer (Subcutaneous Tissue) Fat Layer (Subcutaneous Tissue) Fat Layer (Subcutaneous Tissue) Exposed: Yes Exposed: Yes Exposed: Yes Fascia: No Fascia: No Fascia: No Tendon: No Tendon: No Tendon: No Muscle: No Muscle: No Muscle: No Joint: No Joint: No Joint: No Bone: No Bone: No Bone: No Epithelialization: None None None Procedures Performed: Compression Therapy Compression Therapy Compression Therapy Wound Number: 34 N/A N/A Photos: N/A N/A DAKOTA, VANWART ([id number]) Wound Location: Left, Posterior Lower Leg N/A N/A Wounding Event: Blister N/A N/A Primary Etiology: Diabetic Wound/Ulcer of the Lower N/A N/A Extremity Comorbid History: Anemia, Lymphedema, Chronic N/A N/A Obstructive Pulmonary Disease (COPD), Sleep Apnea, Congestive Heart Failure, Coronary Artery Disease, Hypertension, Peripheral Venous Disease, Type II Diabetes, Osteoarthritis, Neuropathy, Received Chemotherapy Date Acquired: 09/18/2019 N/A N/A Adam of Merritt: 0 N/A N/A Wound Status: Open N/A N/A Measurements L x W x D (cm) 0.5x0.5x0.1 N/A N/A Area (cm) : 0.196 N/A N/A Volume (cm) : 0.02 N/A N/A Classification: Grade 2 N/A N/A Exudate Amount: Medium N/A N/A Exudate Type: Serosanguineous  N/A N/A Exudate Color: red,  brown N/A N/A Wound Margin: N/A N/A N/A Granulation Amount: Medium (34-66%) N/A N/A Granulation Quality: Red N/A N/A Necrotic Amount: Medium (34-66%) N/A N/A Exposed Structures: Fat Layer (Subcutaneous Tissue) N/A N/A Exposed: Yes Fascia: No Tendon: No Muscle: No Joint: No Bone: No Epithelialization: None N/A N/A Procedures Performed: Compression Therapy N/A N/A Merritt Notes Wound #31 (Right, Anterior Lower Leg) Notes silvercel, xsorb, 4 layer bilateral Wound #32 (Left, Lateral Lower Leg) Notes silvercel, xsorb, 4 layer bilateral Wound #33 (Left, Medial Lower Leg) Notes silvercel, xsorb, 4 layer bilateral Wound #34 (Left, Posterior Lower Leg) Notes silvercel, xsorb, 4 layer bilateral ALECXANDER, MAINWARING ([id number]) Electronic Signature(s) Signed: 09/26/2019 11:57:48 AM Merritt: Linton Ham MD Entered Merritt: Linton Ham on 09/26/2019 10:38:19 Adam Merritt ([id number]) -------------------------------------------------------------------------------- Multi-Disciplinary Care Plan Details Patient Name: Adam Merritt Date of Service: 09/26/2019 9:45 AM Medical Record Number: [id number] Patient Account Number: [id number] Date of Birth/Sex: May 16, 1946 (74 y.o. M) Treating RN: Adam Merritt Primary Care Veta Dambrosia: Adam Merritt Other Clinician: Referring Kritika Stukes: Referral, Self Treating Jaelyn Bourgoin/Adam Merritt: Adam Merritt: 0 Active Inactive Abuse / Safety / Falls / Self Care Management Nursing Diagnoses: Potential for falls Goals: Patient will remain injury free related to falls Date Initiated: 09/26/2019 Target Resolution Date: 12/13/2019 Goal Status: Active Interventions: Assess fall risk on admission and as needed Notes: Venous Leg Ulcer Nursing Diagnoses: Actual venous Insuffiency (use after diagnosis is confirmed) Goals: Patient will maintain optimal edema control Date Initiated: 09/26/2019 Target Resolution Date:  12/13/2019 Goal Status: Active Interventions: Compression as ordered Notes: Wound/Skin Impairment Nursing Diagnoses: Impaired tissue integrity Goals: Ulcer/skin breakdown will heal within 14 Adam Date Initiated: 09/26/2019 Target Resolution Date: 12/13/2019 Goal Status: Active Interventions: Assess patient/caregiver ability to obtain necessary supplies Assess patient/caregiver ability to perform ulcer/skin care regimen upon admission and as needed Assess ulceration(s) every visit Notes: Electronic Signature(s) Signed: 09/26/2019 12:04:24 PM Merritt: Leonides Cave ([id number]) Entered Merritt: Adam Merritt on 09/26/2019 10:20:30 Adam Merritt ([id number]) -------------------------------------------------------------------------------- Pain Assessment Details Patient Name: Adam Merritt Date of Service: 09/26/2019 9:45 AM Medical Record Number: [id number] Patient Account Number: [id number] Date of Birth/Sex: Mar 24, 1946 (74 y.o. M) Treating RN: Adam Merritt Primary Care Keyonte Cookston: Adam Merritt Other Clinician: Referring Amare Kontos: Referral, Self Treating Deangela Randleman/Adam Merritt: Adam Merritt: 0 Active Problems Location of Pain Severity and Description of Pain Patient Has Paino No Site Locations Pain Management and Medication Current Pain Management: Electronic Signature(s) Signed: 09/26/2019 11:45:23 AM Merritt: Adam Merritt Entered Merritt: Adam Merritt on 09/26/2019 09:33:14 Adam Merritt ([id number]) -------------------------------------------------------------------------------- Patient/Caregiver Education Details Patient Name: Adam Merritt Date of Service: 09/26/2019 9:45 AM Medical Record Number: [id number] Patient Account Number: [id number] Date of Birth/Gender: [birth year] (74 y.o. M) Treating RN: Adam Merritt Primary Care Physician: Adam Merritt Other Clinician: Referring Physician: Referral, Self Treating Physician/Adam Merritt: Adam Merritt: 0 Education Assessment Education Provided To: Patient and Caregiver Education Topics Provided Venous: Handouts: Other: use your pumps Methods: Explain/Verbal Responses: State content correctly Electronic Signature(s) Signed: 09/26/2019 12:04:24 PM Merritt: Adam Merritt: Adam Merritt on 09/26/2019 10:24:44 Adam Merritt ([id number]) -------------------------------------------------------------------------------- Wound Assessment Details Patient Name: Adam Merritt Date of Service: 09/26/2019 9:45 AM Medical Record Number: [id number] Patient Account Number: [id number] Date of Birth/Sex: 07/03/1945 (74 y.o. M) Treating RN: Adam Merritt Primary Care Nidia Grogan: Adam Merritt Other Clinician: Referring Jenness Stemler: Referral, Self Treating Abbygael Curtiss/Adam Merritt: Adam Merritt Adam Merritt: 0 Wound Status Wound Number: 31 Primary Diabetic Wound/Ulcer of the  Lower Extremity Etiology: Wound Location: Right, Anterior Lower Leg Wound Open Wounding Event: Blister Status: Date Acquired: 09/18/2019 Comorbid Anemia, Lymphedema, Chronic Obstructive Pulmonary Adam Of Merritt: 0 History: Disease (COPD), Sleep Apnea, Congestive Heart Failure, Clustered Wound: No Coronary Artery Disease, Hypertension, Peripheral Venous Disease, Type II Diabetes, Osteoarthritis, Neuropathy, Received Chemotherapy Photos Wound Measurements Length: (cm) 2 Width: (cm) 7 Depth: (cm) 0.1 Area: (cm) 10.996 Volume: (cm) 1.1 % Reduction in Area: % Reduction in Volume: Epithelialization: None Tunneling: No Undermining: No Wound Description Classification: Grade 2 Exudate Amount: Medium Exudate Type: Serosanguineous Exudate Color: red, brown Foul Odor After Cleansing: No Slough/Fibrino Yes Wound Bed Granulation Amount: Medium (34-66%) Exposed Structure Granulation Quality: Red Fascia Exposed: No Necrotic Amount: Medium (34-66%) Fat Layer (Subcutaneous Tissue)  Exposed: Yes Necrotic Quality: Adherent Slough Tendon Exposed: No Muscle Exposed: No Joint Exposed: No Bone Exposed: No Merritt Notes Wound #31 (Right, Anterior Lower Leg) Notes silvercel, xsorb, 4 layer bilateral Electronic Signature(s) DEVLYN, RETTER ([id number]) Signed: 09/26/2019 11:45:23 AM Merritt: Adam Merritt Entered Merritt: Adam Merritt on 09/26/2019 09:41:47 Adam Merritt ([id number]) -------------------------------------------------------------------------------- Wound Assessment Details Patient Name: Adam Merritt Date of Service: 09/26/2019 9:45 AM Medical Record Number: [id number] Patient Account Number: [id number] Date of Birth/Sex: [birth year] (74 y.o. M) Treating RN: Adam Merritt Primary Care Malasha Kleppe: Adam Merritt Other Clinician: Referring Karver Fadden: Referral, Self Treating Nasser Ku/Adam Merritt: Adam Merritt: 0 Wound Status Wound Number: 32 Primary Diabetic Wound/Ulcer of the Lower Extremity Etiology: Wound Location: Left, Lateral Lower Leg Wound Open Wounding Event: Blister Status: Date Acquired: 09/18/2019 Comorbid Anemia, Lymphedema, Chronic Obstructive Pulmonary Adam Of Merritt: 0 History: Disease (COPD), Sleep Apnea, Congestive Heart Failure, Clustered Wound: No Coronary Artery Disease, Hypertension, Peripheral Venous Disease, Type II Diabetes, Osteoarthritis, Neuropathy, Received Chemotherapy Photos Wound Measurements Length: (cm) 6.5 Width: (cm) 10 Depth: (cm) 0.1 Area: (cm) 51.051 Volume: (cm) 5.105 % Reduction in Area: % Reduction in Volume: Epithelialization: None Tunneling: No Undermining: No Wound Description Classification: Grade 2 Exudate Amount: Medium Exudate Type: Serosanguineous Exudate Color: red, brown Foul Odor After Cleansing: No Slough/Fibrino Yes Wound Bed Granulation Amount: Medium (34-66%) Exposed Structure Granulation Quality: Red Fascia Exposed: No Necrotic Amount: Medium (34-66%) Fat Layer  (Subcutaneous Tissue) Exposed: Yes Necrotic Quality: Adherent Slough Tendon Exposed: No Muscle Exposed: No Joint Exposed: No Bone Exposed: No Merritt Notes Wound #32 (Left, Lateral Lower Leg) Notes silvercel, xsorb, 4 layer bilateral Electronic Signature(s) SWAN, ZAYED ([id number]) Signed: 09/26/2019 11:45:23 AM Merritt: Adam Merritt Entered Merritt: Adam Merritt on 09/26/2019 09:42:44 Adam Merritt ([id number]) -------------------------------------------------------------------------------- Wound Assessment Details Patient Name: Adam Merritt Date of Service: 09/26/2019 9:45 AM Medical Record Number: [id number] Patient Account Number: [id number] Date of Birth/Sex: 10-14-1945 (74 y.o. M) Treating RN: Adam Merritt Primary Care Vincent Streater: Adam Merritt Other Clinician: Referring Elfie Costanza: Referral, Self Treating Natelie Ostrosky/Adam Merritt: Adam Merritt: 0 Wound Status Wound Number: 33 Primary Diabetic Wound/Ulcer of the Lower Extremity Etiology: Wound Location: Left, Medial Lower Leg Wound Open Wounding Event: Blister Status: Date Acquired: 09/18/2019 Comorbid Anemia, Lymphedema, Chronic Obstructive Pulmonary Adam Of Merritt: 0 History: Disease (COPD), Sleep Apnea, Congestive Heart Failure, Clustered Wound: No Coronary Artery Disease, Hypertension, Peripheral Venous Disease, Type II Diabetes, Osteoarthritis, Neuropathy, Received Chemotherapy Photos Wound Measurements Length: (cm) 3.5 Width: (cm) 1 Depth: (cm) 0.1 Area: (cm) 2.749 Volume: (cm) 0.275 % Reduction in Area: % Reduction in Volume: Epithelialization: None Tunneling: No Undermining: No Wound Description Classification: Grade 2 Wound Margin: Flat and Intact Exudate Amount: Medium Exudate Type:  Serosanguineous Exudate Color: red, brown Foul Odor After Cleansing: No Slough/Fibrino Yes Wound Bed Granulation Amount: Medium (34-66%) Exposed Structure Granulation Quality: Red Fascia Exposed:  No Necrotic Amount: Medium (34-66%) Fat Layer (Subcutaneous Tissue) Exposed: Yes Necrotic Quality: Adherent Slough Tendon Exposed: No Muscle Exposed: No Joint Exposed: No Bone Exposed: No Merritt Notes Wound #33 (Left, Medial Lower Leg) Notes silvercel, xsorb, 4 layer bilateral JERMERY, CARATACHEA ([id number]) Electronic Signature(s) Signed: 09/26/2019 11:45:23 AM Merritt: Adam Merritt Entered Merritt: Adam Merritt on 09/26/2019 09:44:03 Adam Merritt ([id number]) -------------------------------------------------------------------------------- Wound Assessment Details Patient Name: Adam Merritt Date of Service: 09/26/2019 9:45 AM Medical Record Number: [id number] Patient Account Number: [id number] Date of Birth/Sex: 02/28/1946 (74 y.o. M) Treating RN: Adam Merritt Primary Care Kateena Degroote: Adam Merritt Other Clinician: Referring Garlon Tuggle: Referral, Self Treating Fatema Rabe/Adam Merritt: Adam Merritt: 0 Wound Status Wound Number: 34 Primary Diabetic Wound/Ulcer of the Lower Extremity Etiology: Wound Location: Left, Posterior Lower Leg Wound Open Wounding Event: Blister Status: Date Acquired: 09/18/2019 Comorbid Anemia, Lymphedema, Chronic Obstructive Pulmonary Adam Of Merritt: 0 History: Disease (COPD), Sleep Apnea, Congestive Heart Failure, Clustered Wound: No Coronary Artery Disease, Hypertension, Peripheral Venous Disease, Type II Diabetes, Osteoarthritis, Neuropathy, Received Chemotherapy Photos Wound Measurements Length: (cm) 0.5 Width: (cm) 0.5 Depth: (cm) 0.1 Area: (cm) 0.196 Volume: (cm) 0.02 % Reduction in Area: % Reduction in Volume: Epithelialization: None Tunneling: No Undermining: No Wound Description Classification: Grade 2 Exudate Amount: Medium Exudate Type: Serosanguineous Exudate Color: red, brown Foul Odor After Cleansing: No Slough/Fibrino Yes Wound Bed Granulation Amount: Medium (34-66%) Exposed Structure Granulation Quality:  Red Fascia Exposed: No Necrotic Amount: Medium (34-66%) Fat Layer (Subcutaneous Tissue) Exposed: Yes Necrotic Quality: Adherent Slough Tendon Exposed: No Muscle Exposed: No Joint Exposed: No Bone Exposed: No Merritt Notes Wound #34 (Left, Posterior Lower Leg) Notes silvercel, xsorb, 4 layer bilateral Electronic Signature(s) ROMANI, WILBON ([id number]) Signed: 09/26/2019 11:45:23 AM Merritt: Adam Merritt Entered Merritt: Adam Merritt on 09/26/2019 09:44:59 Adam Merritt ([id number]) -------------------------------------------------------------------------------- Vitals Details Patient Name: Adam Merritt Date of Service: 09/26/2019 9:45 AM Medical Record Number: [id number] Patient Account Number: [id number] Date of Birth/Sex: 09/24/1945 (74 y.o. M) Treating RN: Adam Merritt Primary Care Maddalynn Barnard: Adam Merritt Other Clinician: Referring Lalani Winkles: Referral, Self Treating Lysandra Loughmiller/Adam Merritt: Adam Merritt: 0 Vital Signs Time Taken: 09:33 Temperature (F): 97.6 Pulse (bpm): 63 Respiratory Rate (breaths/min): 16 Blood Pressure (mmHg): 140/64 Reference Range: 80 - 120 mg / dl Electronic Signature(s) Signed: 09/26/2019 11:45:23 AM Merritt: Adam Merritt Entered Merritt: Adam Merritt on 09/26/2019 09:33:29

## 2019-09-26 NOTE — Progress Notes (Signed)
Adam Merritt, Adam Merritt ([id number]) Visit Report for 09/26/2019 Chief Complaint Document Details Patient Name: Adam Merritt, Adam Merritt Date of Service: 09/26/2019 9:45 AM Medical Record Number: [id number] Patient Account Number: [id number] Date of Birth/Sex: [birth year] (74 y.o. M) Treating RN: Montey Hora Primary Care Provider: [name] Other Clinician: Referring Provider: Referral, Self Treating Provider/Extender: Tito Dine in Treatment: 0 Information Obtained from: Patient Chief Complaint Bilateral LE Ulcers and right second toe ulcer 09/26/2019; patient is here for review bilateral lower leg wounds. He is well familiar to this clinic Electronic Signature(s) Signed: 09/26/2019 11:57:48 AM By: Linton Ham MD Entered By: Linton Ham on 09/26/2019 10:38:55 Adam Merritt ([id number]) -------------------------------------------------------------------------------- HPI Details Patient Name: Adam Merritt Date of Service: 09/26/2019 9:45 AM Medical Record Number: [id number] Patient Account Number: [id number] Date of Birth/Sex: Oct 24, 1945 (74 y.o. M) Treating RN: Montey Hora Primary Care Provider: [name] Other Clinician: Referring Provider: Referral, Self Treating Provider/Extender: Tito Dine in Treatment: 0 History of Present Illness HPI Description: 10/18/17-He is here for initial evaluation of bilateral lower extremity ulcerations in the presence of venous insufficiency and lymphedema. He has been seen by vascular medicine in the past, Dr. Lucky Cowboy, last seen in 2016. He does have a history of abnormal ABIs, which is to be expected given his lymphedema and venous insufficiency. According to Epic, it appears that all attempts for arterial evaluation and/or angiography were not follow through with by patient. He does have a history of being seen in lymphedema clinic in 2018, stopped going approximately 6 months ago stating "it didn't do any good". He does not have  lymphedema pumps, he does not have custom fit compression wrap/stockings. He is diabetic and his recent A1c last month was 7.6. He admits to chronic bilateral lower extremity pain, no change in pain since blister and ulceration development. He is currently being treated with Levaquin for bronchitis. He has home health and we will continue. 10/25/17-He is here in follow-up evaluation for bilateral lower extremity ulcerationssubtle he remains on Levaquin for bronchitis. Right lower extremity with no evidence of drainage or ulceration, persistent left lower extremity ulceration. He states that home health has not been out since his appointment. He went to [facility] on Tuesday, studies revealed: RIGHT ABI 0.9, TBI 0.6 LEFT ABI 1.1, TBI 0.6 with triphasic flow bilaterally. We will continue his same treatment plan. He has been educated on compression therapy and need for elevation. He will benefit from lymphedema pumps 11/01/17-He is here in follow-up evaluation for left lower extremity ulcer. The right lower extremity remains healed. He has home health services, but they have not been out to see the patient for 2-3 weeks. He states it home health physical therapy changed his dressing yesterday after therapy; he placed Ace wrap compression. We are still waiting for lymphedema pumps, reordered d/t need for company change. 11/08/17-He is here in follow-up evaluation for left lower extremity ulcer. It is improved. Edema is significantly improved with compression therapy. We will continue with same treatment plan and he will follow-up next week. No word regarding lymphedema pumps 11/15/17-He is here in follow-up evaluation for left lower extremity ulcer. He is healed and will be discharged from wound care services. I have reached out to [facility] regarding his lymphedema pumps. They have been unable to reach the patient; the contact number they had with the patient's wife's cell phone and  she has not answered any unrecognized calls. Contact should be made today, trial planned for next  week; [facility] will continue to follow 11/27/17 on evaluation today patient has multiple blistered areas over the right lower extremity his left lower extremity appears to be doing okay. These blistered areas show signs of no infection which is great news. With that being said he did have some necrotic skin overlying which was mechanically debrided away with saline and gauze today without complication. Overall post debridement the wounds appear to be doing better but in general his swelling seems to be increased. This is obviously not good news. I think this is what has given rise to the blisters. 12/04/17 on evaluation today patient presents for follow-up concerning his bilateral lower extremity edema in the right lower extremity ulcers. He has been tolerating the dressing changes without complication. With that being said he has had no real issues with the wraps which is also good news. Overall I'm pleased with the progress he's been making. 12/11/17 on evaluation today patient appears to be doing rather well in regard to his right lateral lower extremity ulcer. He's been tolerating the dressing changes without complication. Fortunately there does not appear to be any evidence of infection at this time. Overall I'm pleased with the progress that is being made. Unfortunately he has been in the hospital due to having what sounds to be a stomach virus/flu fortunately that is starting to get better. 12/18/17 on evaluation today patient actually appears to be doing very well in regard to his bilateral lower extremities the swelling is under fairly good control his lymphedema pumps are still not up and running quite yet. With that being said he does have several areas of opening noted as far as wounds are concerned mainly over the left lower extremity. With that being said I do believe once he gets  lymphedema pumps this would least help you mention some the fluid and preventing this from occurring. Hopefully that will be set up soon sleeves are Artie in place at his home he just waiting for the machine. 12/25/17 on evaluation today patient actually appears to be doing excellent in fact all of his ulcers appear to have resolved his legs appear very well. I do think he needs compression stockings we have discussed this and they are actually going to go to [place] today to elastic therapy to get this fitted for him. I think that is definitely a good thing to do. Readmission: 04/09/18 upon evaluation today this patient is seen for readmission due to bilateral lower extremity lymphedema. He has significant swelling of his extremities especially on the left although the right is also swollen he has weeping from both sides. There are no obvious open wounds at this point. Fortunately he has been doing fairly well for quite a bit of time since I last saw him. Nonetheless unfortunately this seems to have reopened and is giving quite a bit of trouble. He states this began about a week ago when he first called [place] to get in to be seen. No fevers, chills, nausea, or vomiting noted at this time. He has not been using his lymphedema pumps due to the fact that they won't fit on his leg at this point likewise is also not been using his compression for essentially the same reason. 04/16/18 upon evaluation today patient actually appears to be doing a little better in regard to the fluid in his bilateral lower extremities. With that being said he's had three falls since I saw him last week. He also states that he's been feeling very poorly.  I was concerned last week and feel like that the concern is still there as far as the congestion in his chest is concerned he seems to be breathing about the same as last week but again he states he's very weak he's not even able to walk further than from the chair to the door.  His wife had to buy a wheelchair just to be able to get them out of the Adam Merritt, Adam Merritt ([id number]) house to get to the appointment today. This has me very concerned. 04/23/18 on evaluation today patient actually appears to be doing much better than last week's evaluation. At that time actually had to transport him to the ER via EMS and he subsequently was admitted for acute pulmonary edema, acute renal failure, and acute congestive heart failure. Fortunately he is doing much better. Apparently they did dialyze him and were able to take off roughly 35 pounds of fluid. Nonetheless he is feeling much better both in regard to his breathing and he's able to get around much better at this time compared to previous. Overall I'm very happy with how things are at this time. There does not appear to be any evidence of infection currently. No fevers, chills, nausea, or vomiting noted at this time. 04/30/2018 patient seen today for follow-up and management of bilateral lower extremity lymphedema. He did express being more sad today than usual due to the recent loss of his dog. He states that he has been compliant with using the lymphedema pumps. However he does admit a minute over the last 2-3 days he has not been using the pumps due to the recent loss of his dog. At this time there is no drainage or open wounds to his lower extremities. The left leg edema is measuring smaller today. Still has a significant amount of edema on bilateral lower extremities With dry flaky skin. He will be referred to the lymphedema clinic for further management. Will continue 3 layer compression wraps and follow-up in 1-2 weeks.Denies any pain, fever, chairs recently. No recent falls or injuries reported during this visit. 05/07/18 on evaluation today patient actually appears to be doing very well in regard to his lower extremities in general all things considered. With that being said he is having some pain in the legs just due to  the amount of swelling. He does have an area where he had a blister on the left lateral lower extremity this is open at this point other than that there's nothing else weeping at this time. 05/14/18 on evaluation today patient actually appears to be doing excellent all things considered in regard to his lower extremities. He still has a couple areas of weeping on each leg which has continued to be the issue for him. He does have an appointment with the lymphedema clinic although this isn't until February 2020. That was the earliest they had. In the meantime he has continued to tolerate the compression wraps without complication. 05/28/18 on evaluation today patient actually appears to be doing more poorly in regard to his left lower extremity where he has a wound open at this point. He also had a fall where he subsequently injured his right great toe which has led to an open wound at the site unfortunately. He has been tolerating the dressing changes without complication in general as far as the wraps are concerned that he has not been putting any dressing on the left 1st toe ulcer site. 06/11/18 on evaluation today patient appears to be doing  much worse in regard to his bilateral lower extremity ulcers. He has been tolerating the dressing changes without complication although his legs have not been wrapped more recently. Overall I am not very pleased with the way his legs appear. I do believe he needs to be back in compression wraps he still has not received his compression wraps from the [facility] hospital as of yet. 06/18/18 on evaluation today patient actually appears to be doing significantly better than last time I saw him. He has been tolerating the compression wraps without complication in the circumferential ulcers especially appear to be doing much better. His toe ulcer on the right in regard to the great toe is better although not as good as the legs in my pinion. No fevers chills noted 07/02/18 on  evaluation today patient appears to be doing much better in regard to his lower extremity ulcers. Unfortunately since I last saw him he's had the distal portion of his right great toe if he dated it sounds as if this actually went downhill very quickly. I had only seen him a few days prior and the toe did not appear to be infected at that point subsequently became infected very rapidly and it was decided by the surgeon that the distal portion of the toe needed to be removed. The patient seems to be doing well in this regard he tells me. With that being said his lower extremities are doing better from the standpoint of the wounds although he is significantly swollen at this point. 07/09/18 on evaluation today patient appears to be doing better in regard to the wounds on his lower extremities. In fact everything is almost completely healed he is just a small area on the left posterior lower extremity that is open at this point. 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 With that being said he needs some kind of compression it does sound like he may not be wearing his compression, that is the wraps, during the entire time between when he's here visit to visit. Apparently his wife took the current one off because it began to "fall apart". 07/16/18 on evaluation today patient appears to be extremely swollen especially in regard to his left lower extremity unfortunately. He also has a new skin tear over the left lower extremity and there's a smaller area on the right lower extremity as well. Unfortunately this seems to be due in part to blistering and fluid buildup in his leg. He did get the reduction wraps that were ordered by the [facility] hospital for him to go to lymphedema clinic. With that being said his wounds on the legs have  not healed to the point to where they would likely accept them as a patient lymphedema clinic currently. We need to try to get this to heal. With that being said he's been taking his wraps off which is not doing him any favors at this point. In fact this is probably quite counterproductive compared to what needs to occur. We will likely need to increase to a four layer compression wrap and continue to also utilize elevation and he has to keep the wraps on not take them off as he's been doing currently hasn't had a wrap on since Saturday. 07/23/18 on evaluation today patient appears to be doing much better in regard to his bilateral  lower extremities. In fact his left lower extremity which was the largest is actually 15 cm smaller today compared to what it was last time he was here in our clinic. This is obviously good news after just one week. Nonetheless the differences he actually kept the wraps on during the entire week this time. That's not typical for him. I do believe he understands a little bit better now the severity of the situation and why it's important for him to keep these wraps on. 07/30/18 on evaluation today patient actually appears to be doing rather well in regard to his lower extremities. His legs are much smaller than they have been in the past and he actually has only one very small rudder superficial region remaining that is not closed on the left lateral/posterior lower extremity even this is almost completely close. I do believe likely next week he will be healed without any complications. I do think we need to continue the wraps however this seems to be beneficial for him. I also think it may be a good time for [place] to go ahead and see about getting the appointment with the lymphedema clinic which is supposed to be made for him in order to keep this moving along and hopefully get them into compression wraps that will in the end help him to remain healed. 08/06/18 on evaluation today  patient actually appears to be doing very well in regard as bilateral lower extremities. In fact his wounds appear to be completely healed at this time. 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 He is in the process of getting appointment with the lymphedema clinic we have made this referral were just waiting to hear back on the schedule time. We need to follow up on that today as well. Adam Merritt, Adam Merritt ([id number]) 08/13/18 on evaluation today patient actually appears to be doing very well in regard to his bilateral lower extremities there are no open wounds at this point. We are gonna go ahead and see about ordering the Velcro compression wraps for him had a discussion with them about [place] doing it versus the [place] they feel like they can definitely afford going ahead and get the wraps themselves and they would prefer to try to avoid having to go to the lymphedema clinic if it all possible which I completely understand. As long as he has good compression I'm okay either way. 3/17//20 on evaluation today patient actually appears to be doing well in regard to his bilateral lower extremity ulcers. He has been tolerating the dressing changes without complication specifically the compression wraps. Overall is had no issues my fingers finding that I see at this point is that he is having trouble with constipation. He tells me he has not been able to go to the bathroom for about six days. He's taken to over-the-counter oral laxatives unfortunately this is not helping. He has not contacted his doctor. 08/27/18 on evaluation today patient appears to be doing fairly well in regard to his lower extremities at this point. There does not appear to be any new altars is swelling is very well controlled. We are still waiting for his Velcro compression wraps to arrive that should be sometime in the next week is Artie sent the check for these. 09/03/18 on evaluation  today patient appears to be doing excellent in regard to his bilateral lower extremities which he shows no signs of wound openings in regard to this point. He  does have his Velcro compression wraps which did arrive in the mail since I saw him last week. Overall he is doing excellent in my pinion. 09/13/18 on evaluation today patient appears to be doing very well currently in regard to the overall appearance of his bilateral lower extremities although he's a little bit more swollen than last time we saw him. At that point he been discharged without any open wounds. Nonetheless he has a small open wound on the posterior left lower extremity with some evidence of cellulitis noted as well. Fortunately I feel like he has made good progress overall with regard to his lower extremities from were things used to be. 09/20/18 on evaluation today patient actually appears to be doing much better. The erythematous lower extremity is improving wound itself which is still open appears to be doing much better as far as but appearance as well as pain is concerned overall very pleased in this regard. There's no signs of active infection at this time. 09/27/18 on evaluation today patient's wounds on the lower extremity actually appear to be doing fairly well at this time which is good news. There is no evidence of active infection currently and again is just as left lower extremity were there any wounds at this point anyway. I believe they may be completely healed but again I'm not 100% sure based on evaluation today. I think one more week of observation would likely be a good idea. 10/04/18 on evaluation today patient actually appears to be doing excellent in regard to the left lower extremity which actually appears to be completely healed as of today. Unfortunately he's been having issues with his right lower extremity have a new wound that has opened. Fortunately there's no evidence of active infection at this time which  is good news. 10/10/18 upon evaluation today patient actually appears to be doing a little bit worse with the new open area on his right posterior lower extremity. He's been tolerating the dressing changes without complication. Right now we been using his compression wraps although I think we may need to switch back to actually performing bilateral compression wraps are in the clinic. No fevers, chills, nausea, or vomiting noted at this time. 10/17/18 on evaluation today patient actually appears to be doing quite well in regard to his bilateral lower extremity ulcers. He has been tolerating the reps without complication although he would prefer not to rewrap his legs as of today. Fortunately there's no signs of active infection which is good news. No fevers, chills, nausea, or vomiting noted at this time. 10/24/18 on evaluation today patient appears to be doing very well in regard to his lower extremities. His right lower extremity is shown signs of healing and his left lower [place] though not healed appears to be improving which is excellent news. Overall very pleased with how things seem to be progressing at this time. The patient is likewise happy to hear this. His Velcro compression wraps however have not been put on properly will gonna show his wife how to do that properly today. 10/31/18 on evaluation today patient appears to be doing more poorly in regard to his left lower extremity in particular although both lower extremities actually are showing some signs of being worse than my previous evaluation. Unfortunately I'm just not sure that his compression stockings even with the use of the compression/lymphedema pumps seem to be controlling this well. Upon further questioning he tells me that he also is not able to lie flat in  the bed due to his congestive heart failure and difficulty breathing. For that reason he sleeps in his recliner. To make matters worse his recliner also cannot even hold his  legs up so instead of even being somewhat elevated they pretty much hang down to the floor. This is the way he sleeps each night which is definitely counterproductive to everything else that were attempting to do from the standpoint of controlling his fluid. Nonetheless I think that he potentially could benefit from a hospital bed although this would be something that his primary care provider would likely have to order since anything that is order on our side has to be directly related to wound care and again the hospital bed is not necessarily a direct relation to although I think it does contribute to his overall wound status on his lower extremities. 11/07/18 on evaluation today patient appears to be doing better in regard to his bilateral lower extremity. He's been tolerating the dressing changes without complication. We did in the interim since I last saw him switch to just using extras orbiting new alginate and that seems to have done much better for him. I'm very pleased with the overall progress that is made. 11/14/18 on evaluation today patient appears to be redoing rather well in regard to his left lower extremity ulcers which are the only ones remaining at this point. Fortunately there's no signs of active infection at this time which is good news. Overall been very pleased with how things seem to be progressing currently. No fevers, chills, nausea, or vomiting noted at this time. 11/20/17 on evaluation today patient actually appears to be doing quite well in regard to his lower extremities on the left on the right he has several blisters that showed up although there's some question about whether or not he has had his broker compression wraps on like he was supposed to or not. Fortunately there's no signs of active infection at this time which is good news. Unfortunately though he is doing well from the standpoint of the left leg the right leg is not doing as well again this is when we did not  wrap last week. Adam Merritt, Adam Merritt ([id number]) 11/28/18 on evaluation today patient appears to be doing well in regard to his bilateral lower extremities is left appears to be healed is right is not healed but is very close to being so. Overall very pleased with how things seem to be progressing. Patient is likewise happy that things are doing well. 12/09/18 on evaluation today patient actually appears to be completely healed which is excellent news. He actually seems to be doing well in regard to the swelling of the bilateral lower extremities which is also great news. Overall very pleased with how things seem to be progressing. Readmission: 03/18/2019 upon evaluation today patient presents for reevaluation concerning issues that he is having with his left lower extremity where he does have a wound noted upon inspection today. He also has a wound on the right second toe on the tip where it is apparently been rubbing in his shoe I did look at issues and you can actually see where this has been occurring as well. Fortunately there is no signs of infection with regard to the toe necessarily although it is very swollen compared to normal that does have me somewhat concerned about the possibility of further evaluation for infection/osteomyelitis. I would recommend an x-ray to start with. Otherwise he states that it is been 1-2 weeks that he  has had the draining in regard to left lower extremity he does not know how this happened he has been wearing his compression appropriately and I do see that as well today but nonetheless I do think that he needs to continue to be very cautious with regard to elevation as well. 03/25/2019 on evaluation today patient appears to be doing much better in regard to his lower extremity at this time. That is on the left. He did culture positive for Pseudomonas but the good news is he seems to be doing much better the gentamicin we applied topically seems to have done a great  job for him. There is no signs of active infection at this time systemically and locally he is doing much better. No fevers, chills, nausea, vomiting, or diarrhea. In regard to his right toe this seems to be doing much better there is very little pressure noted at this point I did clean up some of the callus today around the edges of the wound as well as the surface of the wound but to be honest he is progressing quite nicely. 10/30; the area on the left anterior lower tibia area is healed over. His edema control is adequate even though his use of the compression pumps seems very intermittent. He has a juxta lite stocking on the right leg and a think there is 1 for the left leg at home. His wife is putting these on. He has an area on the plantar right second toe which is a hammertoe they are trying to offload this 04/08/2019 on evaluation today patient appears to be doing well in regard to his lower extremities which is good news. We are seeing him today for his toe ulcer which is giving him a little bit more trouble but again still seems to be doing better at this point which is good news. He is going require some sharp debridement in regard to the toe today however. 04/15/2019 on evaluation today patient actually appears to be doing quite well with regard to his toe ulcer. I am very pleased with how things are doing in that regard. With regard to his lower extremity edema in general he tells me he is not been using the lymphedema pumps regularly although he does not use them. I think that he needs to use them more regularly he does have a small blister on the left lower extremity this is not open at this point but obviously it something that can get worse if he does not keep the compression going and the pumps going as well. 04/22/2019 on evaluation today patient appears to be doing well with regard to his toe ulcer. He has been tolerating the dressing changes without complication. This is measuring  slightly smaller this week as compared to last week. Fortunately there is no signs of active infection at this time. 05/06/2019 on evaluation today patient actually appears to be doing excellent. In fact his toe appears to be completely healed which is great news. There is no signs of active infection at this time. Readmission: Patient presents today for follow-up after having been discharged at the beginning of December 2020. He states that in recent weeks things have reopened and has been having more trouble at this point. With that being said fortunately there is no signs of active infection at this time. No fever chills noted. Nonetheless he also does have an issue with one of his toes as well that seems to be somewhat this is on the right  foot second toe. 07/25/2019 upon evaluation today patient's lower extremities appear to be doing excellent bilaterally. I really feel like he is showing signs of great improvement and in fact I think he is close to healing which is great news. There is no signs of active infection at this time. No fevers, chills, nausea, vomiting, or diarrhea. 07/31/2019 upon evaluation today patient appears to be doing excellent and in fact I think may be completely healed in regard to his right lower extremity that were still to monitor this 1 more week before closing it out. The left lower extremity is measuring smaller although not completely closed seems to be doing excellent. 08/07/2019 upon evaluation today patient appears to be doing excellent in regard to his wounds. In fact the right lower extremity is completely healed there is no issues here. The left lower extremity has 1 very small area still remaining fortunately there is no signs of infection and overall I think he is very close to closure of the here as well. 08/14/19 upon evaluation today patient appears to be doing excellent in regard to his lower extremities. In fact he appears to be completely healed as  of today. Fortunately there's no signs of active infection at this time. No fevers, chills, nausea, or vomiting noted at this time. READMISSION 09/26/2019 This is a 74 year old man who is well-known to this clinic having just been discharged on 08/14/2019. He has known lymphedema and chronic venous insufficiency. He has Farrow wrap stockings and also external compression pumps. He does not use the external compression pumps however. He does however apparently fairly faithfully use the stockings. They have not been lotion in his legs. He has developed new wounds on the right anterior as well as the left medial left lateral and left posterior calf. He returns for our review of this Past medical history includes coronary artery disease, hypertension, congestive heart failure, COPD, venous insufficiency with lymphedema, type 2 diabetes. He apparently has compression pumps, Farrow wraps and at 1 point a hospital bed although I believe he sleeps in a recliner Adam Merritt, Adam Merritt ([id number]) Electronic Signature(s) Signed: 09/26/2019 11:57:48 AM By: Linton Ham MD Entered By: Linton Ham on 09/26/2019 10:40:52 Adam Merritt ([id number]) -------------------------------------------------------------------------------- Physical Exam Details Patient Name: Adam Merritt Date of Service: 09/26/2019 9:45 AM Medical Record Number: [id number] Patient Account Number: [id number] Date of Birth/Sex: 1945-06-22 (74 y.o. M) Treating RN: Montey Hora Primary Care Provider: [name] Other Clinician: Referring Provider: Referral, Self Treating Provider/Extender: Tito Dine in Treatment: 0 Constitutional Sitting or standing Blood Pressure is within target range for patient.. Pulse regular and within target range for patient.Marland Kitchen Respirations regular, non- labored and within target range.. Temperature is normal and within the target range for the patient.Marland Kitchen appears in no distress. Morbid  obesity. Eyes Conjunctivae clear. No discharge. Respiratory Respiratory effort is easy and symmetric bilaterally. Rate is normal at rest and on room air.. Shallow air entry with mild expiratory wheezing. Cardiovascular Nonpitting edema of both lower extremities left greater than right. What appears to be stasis dermatitis especially of the left leg. Gastrointestinal (GI) Morbidly obese nontender. Integumentary (Hair, Skin) Stasis dermatitis of the left leg. Notes Wound exam oPatient has a horizontal wound across the right mid to lower tibia. oHe has superficial areas on the left medial left lateral and left posterior. Marked edema of the left leg greater than the right. Stasis dermatitis but without evidence of clear cellulitis Electronic Signature(s) Signed: 09/26/2019 11:57:48 AM By: Linton Ham  MD Entered By: Linton Ham on 09/26/2019 10:42:47 Adam Merritt ([id number]) -------------------------------------------------------------------------------- Physician Orders Details Patient Name: Adam Merritt Date of Service: 09/26/2019 9:45 AM Medical Record Number: [id number] Patient Account Number: [id number] Date of Birth/Sex: 01-10-1946 (74 y.o. M) Treating RN: Montey Hora Primary Care Provider: [name] Other Clinician: Referring Provider: Referral, Self Treating Provider/Extender: Tito Dine in Treatment: 0 Verbal / Phone Orders: No Diagnosis Coding Wound Cleansing Wound #31 Right,Anterior Lower Leg o Clean wound with Normal Saline. o May shower with protection. - Please do not get your wraps wet Wound #32 Left,Lateral Lower Leg o Clean wound with Normal Saline. o May shower with protection. - Please do not get your wraps wet Wound #33 Left,Medial Lower Leg o Clean wound with Normal Saline. o May shower with protection. - Please do not get your wraps wet Wound #34 Left,Posterior Lower Leg o Clean wound with Normal Saline. o May  shower with protection. - Please do not get your wraps wet Skin Barriers/Peri-Wound Care o Moisturizing lotion - to both lower legs Primary Wound Dressing Wound #31 Right,Anterior Lower Leg o Silver Alginate Wound #32 Left,Lateral Lower Leg o Silver Alginate Wound #33 Left,Medial Lower Leg o Silver Alginate Wound #34 Left,Posterior Lower Leg o Silver Alginate Secondary Dressing Wound #31 Right,Anterior Lower Leg o XtraSorb Wound #32 Left,Lateral Lower Leg o XtraSorb Wound #33 Left,Medial Lower Leg o XtraSorb Wound #34 Left,Posterior Lower Leg o XtraSorb Dressing Change Frequency o Change dressing every week - Please call for nurse visit if your wraps start to slide down your legs Follow-up Appointments o Return Appointment in 1 week. o Nurse Visit as needed - Please call for nurse visit if your wraps start to slide down your legs Edema Control Wound #31 Right,Anterior Lower Leg ZAYVIEN, CANNING. ([id number]) o 4 Layer Compression System - Bilateral o Elevate legs to the level of the heart and pump ankles as often as possible o Compression Pump: Use compression pump on left lower extremity for 60 minutes, twice daily. o Compression Pump: Use compression pump on right lower extremity for 60 minutes, twice daily. Wound #32 Left,Lateral Lower Leg o 4 Layer Compression System - Bilateral o Elevate legs to the level of the heart and pump ankles as often as possible o Compression Pump: Use compression pump on left lower extremity for 60 minutes, twice daily. o Compression Pump: Use compression pump on right lower extremity for 60 minutes, twice daily. Wound #33 Left,Medial Lower Leg o 4 Layer Compression System - Bilateral o Elevate legs to the level of the heart and pump ankles as often as possible o Compression Pump: Use compression pump on left lower extremity for 60 minutes, twice daily. o Compression Pump: Use compression pump on  right lower extremity for 60 minutes, twice daily. Wound #34 Left,Posterior Lower Leg o 4 Layer Compression System - Bilateral o Elevate legs to the level of the heart and pump ankles as often as possible o Compression Pump: Use compression pump on left lower extremity for 60 minutes, twice daily. o Compression Pump: Use compression pump on right lower extremity for 60 minutes, twice daily. Electronic Signature(s) Signed: 09/26/2019 11:57:48 AM By: Linton Ham MD Signed: 09/26/2019 12:04:24 PM By: Montey Hora Entered By: Montey Hora on 09/26/2019 10:23:40 Adam Merritt ([id number]) -------------------------------------------------------------------------------- Problem List Details Patient Name: Adam Merritt Date of Service: 09/26/2019 9:45 AM Medical Record Number: [id number] Patient Account Number: [id number] Date of Birth/Sex: Nov 07, 1945 (74 y.o. M) Treating RN: Montey Hora  Primary Care Provider: [name] Other Clinician: Referring Provider: Referral, Self Treating Provider/Extender: Tito Dine in Treatment: 0 Active Problems ICD-10 Evaluated Encounter Code Description Active Date Today Diagnosis I89.0 Lymphedema, not elsewhere classified 09/26/2019 No Yes L97.222 Non-pressure chronic ulcer of left calf with fat layer exposed 09/26/2019 No Yes L97.812 Non-pressure chronic ulcer of other part of right lower leg with fat layer 09/26/2019 No Yes exposed Inactive Problems Resolved Problems Electronic Signature(s) Signed: 09/26/2019 11:57:48 AM By: Linton Ham MD Entered By: Linton Ham on 09/26/2019 10:37:04 Adam Merritt ([id number]) -------------------------------------------------------------------------------- Progress Note Details Patient Name: Adam Merritt Date of Service: 09/26/2019 9:45 AM Medical Record Number: [id number] Patient Account Number: [id number] Date of Birth/Sex: 11-24-1945 (74 y.o. M) Treating RN: Montey Hora Primary Care Provider: [name] Other Clinician: Referring Provider: Referral, Self Treating Provider/Extender: Tito Dine in Treatment: 0 Subjective Chief Complaint Information obtained from Patient Bilateral LE Ulcers and right second toe ulcer 09/26/2019; patient is here for review bilateral lower leg wounds. He is well familiar to this clinic History of Present Illness (HPI) 10/18/17-He is here for initial evaluation of bilateral lower extremity ulcerations in the presence of venous insufficiency and lymphedema. He has been seen by vascular medicine in the past, Dr. Lucky Cowboy, last seen in 2016. He does have a history of abnormal ABIs, which is to be expected given his lymphedema and venous insufficiency. According to Epic, it appears that all attempts for arterial evaluation and/or angiography were not follow through with by patient. He does have a history of being seen in lymphedema clinic in 2018, stopped going approximately 6 months ago stating "it didn't do any good". He does not have lymphedema pumps, he does not have custom fit compression wrap/stockings. He is diabetic and his recent A1c last month was 7.6. He admits to chronic bilateral lower extremity pain, no change in pain since blister and ulceration development. He is currently being treated with Levaquin for bronchitis. He has home health and we will continue. 10/25/17-He is here in follow-up evaluation for bilateral lower extremity ulcerationssubtle he remains on Levaquin for bronchitis. Right lower extremity with no evidence of drainage or ulceration, persistent left lower extremity ulceration. He states that home health has not been out since his appointment. He went to [facility] on Tuesday, studies revealed: RIGHT ABI 0.9, TBI 0.6 LEFT ABI 1.1, TBI 0.6 with triphasic flow bilaterally. We will continue his same treatment plan. He has been educated on compression therapy and need for elevation.  He will benefit from lymphedema pumps 11/01/17-He is here in follow-up evaluation for left lower extremity ulcer. The right lower extremity remains healed. He has home health services, but they have not been out to see the patient for 2-3 weeks. He states it home health physical therapy changed his dressing yesterday after therapy; he placed Ace wrap compression. We are still waiting for lymphedema pumps, reordered d/t need for company change. 11/08/17-He is here in follow-up evaluation for left lower extremity ulcer. It is improved. Edema is significantly improved with compression therapy. We will continue with same treatment plan and he will follow-up next week. No word regarding lymphedema pumps 11/15/17-He is here in follow-up evaluation for left lower extremity ulcer. He is healed and will be discharged from wound care services. I have reached out to [facility] regarding his lymphedema pumps. They have been unable to reach the patient; the contact number they had with the patient's wife's cell phone and she has  not answered any unrecognized calls. Contact should be made today, trial planned for next week; [facility] will continue to follow 11/27/17 on evaluation today patient has multiple blistered areas over the right lower extremity his left lower extremity appears to be doing okay. These blistered areas show signs of no infection which is great news. With that being said he did have some necrotic skin overlying which was mechanically debrided away with saline and gauze today without complication. Overall post debridement the wounds appear to be doing better but in general his swelling seems to be increased. This is obviously not good news. I think this is what has given rise to the blisters. 12/04/17 on evaluation today patient presents for follow-up concerning his bilateral lower extremity edema in the right lower extremity ulcers. He has been tolerating the dressing changes without  complication. With that being said he has had no real issues with the wraps which is also good news. Overall I'm pleased with the progress he's been making. 12/11/17 on evaluation today patient appears to be doing rather well in regard to his right lateral lower extremity ulcer. He's been tolerating the dressing changes without complication. Fortunately there does not appear to be any evidence of infection at this time. Overall I'm pleased with the progress that is being made. Unfortunately he has been in the hospital due to having what sounds to be a stomach virus/flu fortunately that is starting to get better. 12/18/17 on evaluation today patient actually appears to be doing very well in regard to his bilateral lower extremities the swelling is under fairly good control his lymphedema pumps are still not up and running quite yet. With that being said he does have several areas of opening noted as far as wounds are concerned mainly over the left lower extremity. With that being said I do believe once he gets lymphedema pumps this would least help you mention some the fluid and preventing this from occurring. Hopefully that will be set up soon sleeves are Artie in place at his home he just waiting for the machine. 12/25/17 on evaluation today patient actually appears to be doing excellent in fact all of his ulcers appear to have resolved his legs appear very well. I do think he needs compression stockings we have discussed this and they are actually going to go to [place] today to elastic therapy to get this fitted for him. I think that is definitely a good thing to do. Readmission: 04/09/18 upon evaluation today this patient is seen for readmission due to bilateral lower extremity lymphedema. He has significant swelling of his extremities especially on the left although the right is also swollen he has weeping from both sides. There are no obvious open wounds at this point. Fortunately he has been doing  fairly well for quite a bit of time since I last saw him. Nonetheless unfortunately this seems to have reopened and is giving quite a bit of trouble. He states this began about a week ago when he first called [place] to get in to be seen. No fevers, chills, nausea, or vomiting noted at this time. He has not been using his lymphedema pumps due to the fact that they won't fit on his leg at this point likewise is also not been using his compression for essentially the same reason. DENNISE, BAMBER ([id number]) 04/16/18 upon evaluation today patient actually appears to be doing a little better in regard to the fluid in his bilateral lower extremities. With that being said  he's had three falls since I saw him last week. He also states that he's been feeling very poorly. I was concerned last week and feel like that the concern is still there as far as the congestion in his chest is concerned he seems to be breathing about the same as last week but again he states he's very weak he's not even able to walk further than from the chair to the door. His wife had to buy a wheelchair just to be able to get them out of the house to get to the appointment today. This has me very concerned. 04/23/18 on evaluation today patient actually appears to be doing much better than last week's evaluation. At that time actually had to transport him to the ER via EMS and he subsequently was admitted for acute pulmonary edema, acute renal failure, and acute congestive heart failure. Fortunately he is doing much better. Apparently they did dialyze him and were able to take off roughly 35 pounds of fluid. Nonetheless he is feeling much better both in regard to his breathing and he's able to get around much better at this time compared to previous. Overall I'm very happy with how things are at this time. There does not appear to be any evidence of infection currently. No fevers, chills, nausea, or vomiting noted at this time. 04/30/2018  patient seen today for follow-up and management of bilateral lower extremity lymphedema. He did express being more sad today than usual due to the recent loss of his dog. He states that he has been compliant with using the lymphedema pumps. However he does admit a minute over the last 2-3 days he has not been using the pumps due to the recent loss of his dog. At this time there is no drainage or open wounds to his lower extremities. The left leg edema is measuring smaller today. Still has a significant amount of edema on bilateral lower extremities With dry flaky skin. He will be referred to the lymphedema clinic for further management. Will continue 3 layer compression wraps and follow-up in 1-2 weeks.Denies any pain, fever, chairs recently. No recent falls or injuries reported during this visit. 05/07/18 on evaluation today patient actually appears to be doing very well in regard to his lower extremities in general all things considered. With that being said he is having some pain in the legs just due to the amount of swelling. He does have an area where he had a blister on the left lateral lower extremity this is open at this point other than that there's nothing else weeping at this time. 05/14/18 on evaluation today patient actually appears to be doing excellent all things considered in regard to his lower extremities. He still has a couple areas of weeping on each leg which has continued to be the issue for him. He does have an appointment with the lymphedema clinic although this isn't until February 2020. That was the earliest they had. In the meantime he has continued to tolerate the compression wraps without complication. 05/28/18 on evaluation today patient actually appears to be doing more poorly in regard to his left lower extremity where he has a wound open at this point. He also had a fall where he subsequently injured his right great toe which has led to an open wound at the site  unfortunately. He has been tolerating the dressing changes without complication in general as far as the wraps are concerned that he has not been putting any dressing on  the left 1st toe ulcer site. 06/11/18 on evaluation today patient appears to be doing much worse in regard to his bilateral lower extremity ulcers. He has been tolerating the dressing changes without complication although his legs have not been wrapped more recently. Overall I am not very pleased with the way his legs appear. I do believe he needs to be back in compression wraps he still has not received his compression wraps from the [facility] hospital as of yet. 06/18/18 on evaluation today patient actually appears to be doing significantly better than last time I saw him. He has been tolerating the compression wraps without complication in the circumferential ulcers especially appear to be doing much better. His toe ulcer on the right in regard to the great toe is better although not as good as the legs in my pinion. No fevers chills noted 07/02/18 on evaluation today patient appears to be doing much better in regard to his lower extremity ulcers. Unfortunately since I last saw him he's had the distal portion of his right great toe if he dated it sounds as if this actually went downhill very quickly. I had only seen him a few days prior and the toe did not appear to be infected at that point subsequently became infected very rapidly and it was decided by the surgeon that the distal portion of the toe needed to be removed. The patient seems to be doing well in this regard he tells me. With that being said his lower extremities are doing better from the standpoint of the wounds although he is significantly swollen at this point. 07/09/18 on evaluation today patient appears to be doing better in regard to the wounds on his lower extremities. In fact everything is almost completely healed he is just a small area on the left posterior lower  extremity that is open at this point. 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 With that being said he needs some kind of compression it does sound like he may not be wearing his compression, that is the wraps, during the entire time between when he's here visit to visit. Apparently his wife took the current one off because it began to "fall apart". 07/16/18 on evaluation today patient appears to be extremely swollen especially in regard to his left lower extremity unfortunately. He also has a new skin tear over the left lower extremity and there's a smaller area on the right lower extremity as well. Unfortunately this seems to be due in part to blistering and fluid buildup in his leg. He did get the reduction wraps that were ordered by the [facility] hospital for him to go to lymphedema clinic. With that being said his wounds on the legs have not healed to the point to where they would likely accept them as a patient lymphedema clinic currently. We need to try to get this to heal. With that being said he's been taking his wraps off which is not doing him any favors at this point. In fact this is probably quite counterproductive compared to what needs to occur. We will likely need to increase to a four layer compression wrap and continue to also utilize elevation and he has to keep the wraps on not take them off as he's been doing currently hasn't had a wrap on since Saturday. 07/23/18 on  evaluation today patient appears to be doing much better in regard to his bilateral lower extremities. In fact his left lower extremity which was the largest is actually 15 cm smaller today compared to what it was last time he was here in our clinic. This is obviously good news after just one week. Nonetheless the differences he actually kept  the wraps on during the entire week this time. That's not typical for him. I do believe he understands a little bit better now the severity of the situation and why it's important for him to keep these wraps on. 07/30/18 on evaluation today patient actually appears to be doing rather well in regard to his lower extremities. His legs are much smaller than they have been in the past and he actually has only one very small rudder superficial region remaining that is not closed on the left lateral/posterior lower extremity even this is almost completely close. I do believe likely next week he will be healed without any complications. I do think we need to continue the wraps however this seems to be beneficial for him. I also think it may be a good time for [place] to go ahead and see about getting the appointment with the lymphedema clinic which is supposed to be made for him in order to keep this moving along and hopefully get them into compression wraps that will in the end help him to remain healed. Adam Merritt, Adam Merritt ([id number]) 08/06/18 on evaluation today patient actually appears to be doing very well in regard as bilateral lower extremities. In fact his wounds appear to be completely healed at this time. 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 He is in the process of getting appointment with the lymphedema clinic we have made this referral were just waiting to hear back on the schedule time. We need to follow up on that today as well. 08/13/18 on evaluation today patient actually appears to be doing very well in regard to his bilateral lower extremities there are no open wounds at this point. We are gonna go ahead and see about ordering the Velcro compression wraps for him had a discussion with them about [place] doing it versus the [place] they feel like they can definitely afford going ahead and get the wraps themselves and they would prefer to try to  avoid having to go to the lymphedema clinic if it all possible which I completely understand. As long as he has good compression I'm okay either way. 3/17//20 on evaluation today patient actually appears to be doing well in regard to his bilateral lower extremity ulcers. He has been tolerating the dressing changes without complication specifically the compression wraps. Overall is had no issues my fingers finding that I see at this point is that he is having trouble with constipation. He tells me he has not been able to go to the bathroom for about six days. He's taken to over-the-counter oral laxatives unfortunately this is not helping. He has not contacted his doctor. 08/27/18 on evaluation today patient appears to be doing fairly well in regard to his lower extremities at this point. There does not appear to be any new altars is swelling is very well controlled. We are still waiting for his Velcro compression wraps to arrive that should be sometime in the next week is Artie sent the check for these. 09/03/18 on evaluation today patient appears to be doing excellent in regard to his bilateral lower extremities  which he shows no signs of wound openings in regard to this point. He does have his Velcro compression wraps which did arrive in the mail since I saw him last week. Overall he is doing excellent in my pinion. 09/13/18 on evaluation today patient appears to be doing very well currently in regard to the overall appearance of his bilateral lower extremities although he's a little bit more swollen than last time we saw him. At that point he been discharged without any open wounds. Nonetheless he has a small open wound on the posterior left lower extremity with some evidence of cellulitis noted as well. Fortunately I feel like he has made good progress overall with regard to his lower extremities from were things used to be. 09/20/18 on evaluation today patient actually appears to be doing much  better. The erythematous lower extremity is improving wound itself which is still open appears to be doing much better as far as but appearance as well as pain is concerned overall very pleased in this regard. There's no signs of active infection at this time. 09/27/18 on evaluation today patient's wounds on the lower extremity actually appear to be doing fairly well at this time which is good news. There is no evidence of active infection currently and again is just as left lower extremity were there any wounds at this point anyway. I believe they may be completely healed but again I'm not 100% sure based on evaluation today. I think one more week of observation would likely be a good idea. 10/04/18 on evaluation today patient actually appears to be doing excellent in regard to the left lower extremity which actually appears to be completely healed as of today. Unfortunately he's been having issues with his right lower extremity have a new wound that has opened. Fortunately there's no evidence of active infection at this time which is good news. 10/10/18 upon evaluation today patient actually appears to be doing a little bit worse with the new open area on his right posterior lower extremity. He's been tolerating the dressing changes without complication. Right now we been using his compression wraps although I think we may need to switch back to actually performing bilateral compression wraps are in the clinic. No fevers, chills, nausea, or vomiting noted at this time. 10/17/18 on evaluation today patient actually appears to be doing quite well in regard to his bilateral lower extremity ulcers. He has been tolerating the reps without complication although he would prefer not to rewrap his legs as of today. Fortunately there's no signs of active infection which is good news. No fevers, chills, nausea, or vomiting noted at this time. 10/24/18 on evaluation today patient appears to be doing very well in  regard to his lower extremities. His right lower extremity is shown signs of healing and his left lower Trinity though not healed appears to be improving which is excellent news. Overall very pleased with how things seem to be progressing at this time. The patient is likewise happy to hear this. His Velcro compression wraps however have not been put on properly will gonna show his wife how to do that properly today. 10/31/18 on evaluation today patient appears to be doing more poorly in regard to his left lower extremity in particular although both lower extremities actually are showing some signs of being worse than my previous evaluation. Unfortunately I'm just not sure that his compression stockings even with the use of the compression/lymphedema pumps seem to be controlling this well. Upon  further questioning he tells me that he also is not able to lie flat in the bed due to his congestive heart failure and difficulty breathing. For that reason he sleeps in his recliner. To make matters worse his recliner also cannot even hold his legs up so instead of even being somewhat elevated they pretty much hang down to the floor. This is the way he sleeps each night which is definitely counterproductive to everything else that were attempting to do from the standpoint of controlling his fluid. Nonetheless I think that he potentially could benefit from a hospital bed although this would be something that his primary care provider would likely have to order since anything that is order on our side has to be directly related to wound care and again the hospital bed is not necessarily a direct relation to although I think it does contribute to his overall wound status on his lower extremities. 11/07/18 on evaluation today patient appears to be doing better in regard to his bilateral lower extremity. He's been tolerating the dressing changes without complication. We did in the interim since I last saw him switch to  just using extras orbiting new alginate and that seems to have done much better for him. I'm very pleased with the overall progress that is made. 11/14/18 on evaluation today patient appears to be redoing rather well in regard to his left lower extremity ulcers which are the only ones remaining at this point. Fortunately there's no signs of active infection at this time which is good news. Overall been very pleased with how things seem to be progressing currently. No fevers, chills, nausea, or vomiting noted at this time. Adam Merritt, Adam Merritt ([id number]) 11/20/17 on evaluation today patient actually appears to be doing quite well in regard to his lower extremities on the left on the right he has several blisters that showed up although there's some question about whether or not he has had his broker compression wraps on like he was supposed to or not. Fortunately there's no signs of active infection at this time which is good news. Unfortunately though he is doing well from the standpoint of the left leg the right leg is not doing as well again this is when we did not wrap last week. 11/28/18 on evaluation today patient appears to be doing well in regard to his bilateral lower extremities is left appears to be healed is right is not healed but is very close to being so. Overall very pleased with how things seem to be progressing. Patient is likewise happy that things are doing well. 12/09/18 on evaluation today patient actually appears to be completely healed which is excellent news. He actually seems to be doing well in regard to the swelling of the bilateral lower extremities which is also great news. Overall very pleased with how things seem to be progressing. Readmission: 03/18/2019 upon evaluation today patient presents for reevaluation concerning issues that he is having with his left lower extremity where he does have a wound noted upon inspection today. He also has a wound on the right second toe on  the tip where it is apparently been rubbing in his shoe I did look at issues and you can actually see where this has been occurring as well. Fortunately there is no signs of infection with regard to the toe necessarily although it is very swollen compared to normal that does have me somewhat concerned about the possibility of further evaluation for infection/osteomyelitis. I would recommend an  x-ray to start with. Otherwise he states that it is been 1-2 weeks that he has had the draining in regard to left lower extremity he does not know how this happened he has been wearing his compression appropriately and I do see that as well today but nonetheless I do think that he needs to continue to be very cautious with regard to elevation as well. 03/25/2019 on evaluation today patient appears to be doing much better in regard to his lower extremity at this time. That is on the left. He did culture positive for Pseudomonas but the good news is he seems to be doing much better the gentamicin we applied topically seems to have done a great job for him. There is no signs of active infection at this time systemically and locally he is doing much better. No fevers, chills, nausea, vomiting, or diarrhea. In regard to his right toe this seems to be doing much better there is very little pressure noted at this point I did clean up some of the callus today around the edges of the wound as well as the surface of the wound but to be honest he is progressing quite nicely. 10/30; the area on the left anterior lower tibia area is healed over. His edema control is adequate even though his use of the compression pumps seems very intermittent. He has a juxta lite stocking on the right leg and a think there is 1 for the left leg at home. His wife is putting these on. He has an area on the plantar right second toe which is a hammertoe they are trying to offload this 04/08/2019 on evaluation today patient appears to be doing well  in regard to his lower extremities which is good news. We are seeing him today for his toe ulcer which is giving him a little bit more trouble but again still seems to be doing better at this point which is good news. He is going require some sharp debridement in regard to the toe today however. 04/15/2019 on evaluation today patient actually appears to be doing quite well with regard to his toe ulcer. I am very pleased with how things are doing in that regard. With regard to his lower extremity edema in general he tells me he is not been using the lymphedema pumps regularly although he does not use them. I think that he needs to use them more regularly he does have a small blister on the left lower extremity this is not open at this point but obviously it something that can get worse if he does not keep the compression going and the pumps going as well. 04/22/2019 on evaluation today patient appears to be doing well with regard to his toe ulcer. He has been tolerating the dressing changes without complication. This is measuring slightly smaller this week as compared to last week. Fortunately there is no signs of active infection at this time. 05/06/2019 on evaluation today patient actually appears to be doing excellent. In fact his toe appears to be completely healed which is great news. There is no signs of active infection at this time. Readmission: Patient presents today for follow-up after having been discharged at the beginning of December 2020. He states that in recent weeks things have reopened and has been having more trouble at this point. With that being said fortunately there is no signs of active infection at this time. No fever chills noted. Nonetheless he also does have an issue with one of  his toes as well that seems to be somewhat this is on the right foot second toe. 07/25/2019 upon evaluation today patient's lower extremities appear to be doing excellent bilaterally. I really feel  like he is showing signs of great improvement and in fact I think he is close to healing which is great news. There is no signs of active infection at this time. No fevers, chills, nausea, vomiting, or diarrhea. 07/31/2019 upon evaluation today patient appears to be doing excellent and in fact I think may be completely healed in regard to his right lower extremity that were still to monitor this 1 more week before closing it out. The left lower extremity is measuring smaller although not completely closed seems to be doing excellent. 08/07/2019 upon evaluation today patient appears to be doing excellent in regard to his wounds. In fact the right lower extremity is completely healed there is no issues here. The left lower extremity has 1 very small area still remaining fortunately there is no signs of infection and overall I think he is very close to closure of the here as well. 08/14/19 upon evaluation today patient appears to be doing excellent in regard to his lower extremities. In fact he appears to be completely healed as of today. Fortunately there's no signs of active infection at this time. No fevers, chills, nausea, or vomiting noted at this time. READMISSION 09/26/2019 This is a 74 year old man who is well-known to this clinic having just been discharged on 08/14/2019. He has known lymphedema and chronic venous insufficiency. He has Farrow wrap stockings and also external compression pumps. He does not use the external compression pumps however. He does however apparently fairly faithfully use the stockings. They have not been lotion in his legs. Adam Merritt, Adam Merritt ([id number]) He has developed new wounds on the right anterior as well as the left medial left lateral and left posterior calf. He returns for our review of this Past medical history includes coronary artery disease, hypertension, congestive heart failure, COPD, venous insufficiency with lymphedema, type 2 diabetes. He apparently has  compression pumps, Farrow wraps and at 1 point a hospital bed although I believe he sleeps in a recliner Patient History Information obtained from Patient. Allergies Corticosteroids (Glucocorticoids) Family History Cancer - Paternal Grandparents, Kidney Disease - Siblings, Lung Disease - Mother, No family history of Diabetes, Heart Disease, Hereditary Spherocytosis, Hypertension, Seizures, Stroke, Thyroid Problems, Tuberculosis. Social History Never smoker, Marital Status - Married, Alcohol Use - Never, Drug Use - No History, Caffeine Use - Never. Medical History Eyes Denies history of Cataracts, Glaucoma, Optic Neuritis Ear/Nose/Mouth/Throat Denies history of Chronic sinus problems/congestion, Middle ear problems Hematologic/Lymphatic Patient has history of Anemia - aplastic anemia, Lymphedema Denies history of Hemophilia, Human Immunodeficiency Virus, Sickle Cell Disease Respiratory Patient has history of Chronic Obstructive Pulmonary Disease (COPD), Sleep Apnea Denies history of Aspiration, Asthma, Pneumothorax, Tuberculosis Cardiovascular Patient has history of Congestive Heart Failure, Coronary Artery Disease, Hypertension, Peripheral Venous Disease Denies history of Angina, Arrhythmia, Deep Vein Thrombosis, Hypotension, Myocardial Infarction, Peripheral Arterial Disease, Phlebitis, Vasculitis Gastrointestinal Denies history of Cirrhosis , Colitis, Crohn s, Hepatitis A, Hepatitis B, Hepatitis C Endocrine Patient has history of Type II Diabetes Denies history of Type I Diabetes Genitourinary Denies history of End Stage Renal Disease Immunological Denies history of Lupus Erythematosus, Raynaud s, Scleroderma Integumentary (Skin) Denies history of History of Burn, History of pressure wounds Musculoskeletal Patient has history of Osteoarthritis Denies history of Gout, Rheumatoid Arthritis, Osteomyelitis Neurologic Patient has history of Neuropathy  Denies history of Dementia,  Quadriplegia, Paraplegia, Seizure Disorder Oncologic Patient has history of Received Chemotherapy - last dose 2006 Denies history of Received Radiation Psychiatric Denies history of Anorexia/bulimia, Confinement Anxiety Hospitalization/Surgery History - bronchitis. Medical And Surgical History Notes Respiratory home O2 at night as needed Cardiovascular varicose veins Neurologic CVA - 1992 Review of Systems (ROS) Constitutional Symptoms (General Health) Denies complaints or symptoms of Fatigue, Fever, Chills, Marked Weight Change. Eyes TREMAINE, EARWOOD ([id number]) Denies complaints or symptoms of Dry Eyes, Vision Changes, Glasses / Contacts. Ear/Nose/Mouth/Throat Denies complaints or symptoms of Difficult clearing ears, Sinusitis. Hematologic/Lymphatic Denies complaints or symptoms of Bleeding / Clotting Disorders, Human Immunodeficiency Virus. Cardiovascular Denies complaints or symptoms of Chest pain, LE edema. Gastrointestinal Denies complaints or symptoms of Frequent diarrhea, Nausea, Vomiting. Endocrine Denies complaints or symptoms of Hepatitis, Thyroid disease, Polydypsia (Excessive Thirst). Genitourinary Denies complaints or symptoms of Kidney failure/ Dialysis, Incontinence/dribbling. Immunological Denies complaints or symptoms of Hives, Itching. Integumentary (Skin) Denies complaints or symptoms of Wounds, Bleeding or bruising tendency, Breakdown, Swelling. Musculoskeletal Denies complaints or symptoms of Muscle Pain, Muscle Weakness. Neurologic Denies complaints or symptoms of Numbness/parasthesias, Focal/Weakness. Psychiatric Denies complaints or symptoms of Anxiety, Claustrophobia. Objective Constitutional Sitting or standing Blood Pressure is within target range for patient.. Pulse regular and within target range for patient.Marland Kitchen Respirations regular, non- labored and within target range.. Temperature is normal and within the target range for the patient.Marland Kitchen  appears in no distress. Morbid obesity. Vitals Time Taken: 9:33 AM, Temperature: 97.6 F, Pulse: 63 bpm, Respiratory Rate: 16 breaths/min, Blood Pressure: 140/64 mmHg. Eyes Conjunctivae clear. No discharge. Respiratory Respiratory effort is easy and symmetric bilaterally. Rate is normal at rest and on room air.. Shallow air entry with mild expiratory wheezing. Cardiovascular Nonpitting edema of both lower extremities left greater than right. What appears to be stasis dermatitis especially of the left leg. Gastrointestinal (GI) Morbidly obese nontender. General Notes: Wound exam Patient has a horizontal wound across the right mid to lower tibia. He has superficial areas on the left medial left lateral and left posterior. Marked edema of the left leg greater than the right. Stasis dermatitis but without evidence of clear cellulitis Integumentary (Hair, Skin) Stasis dermatitis of the left leg. Wound #31 status is Open. Original cause of wound was Blister. The wound is located on the Right,Anterior Lower Leg. The wound measures 2cm length x 7cm width x 0.1cm depth; 10.996cm^2 area and 1.1cm^3 volume. There is Fat Layer (Subcutaneous Tissue) Exposed exposed. There is no tunneling or undermining noted. There is a medium amount of serosanguineous drainage noted. There is medium (34-66%) red granulation within the wound bed. There is a medium (34-66%) amount of necrotic tissue within the wound bed including Adherent Slough. Wound #32 status is Open. Original cause of wound was Blister. The wound is located on the Left,Lateral Lower Leg. The wound measures 6.5cm length x 10cm width x 0.1cm depth; 51.051cm^2 area and 5.105cm^3 volume. There is Fat Layer (Subcutaneous Tissue) Exposed exposed. There is no tunneling or undermining noted. There is a medium amount of serosanguineous drainage noted. There is medium (34-66%) red granulation within the wound bed. There is a medium (34-66%) amount of necrotic  tissue within the wound bed including Adherent Slough. Wound #33 status is Open. Original cause of wound was Blister. The wound is located on the Left,Medial Lower Leg. The wound measures 3.5cm length x 1cm width x 0.1cm depth; 2.749cm^2 area and 0.275cm^3 volume. There is Fat Layer (Subcutaneous Tissue) Exposed exposed. There  is no Adam Merritt, Adam Merritt. ([id number]) tunneling or undermining noted. There is a medium amount of serosanguineous drainage noted. The wound margin is flat and intact. There is medium (34-66%) red granulation within the wound bed. There is a medium (34-66%) amount of necrotic tissue within the wound bed including Adherent Slough. Wound #34 status is Open. Original cause of wound was Blister. The wound is located on the Left,Posterior Lower Leg. The wound measures 0.5cm length x 0.5cm width x 0.1cm depth; 0.196cm^2 area and 0.02cm^3 volume. There is Fat Layer (Subcutaneous Tissue) Exposed exposed. There is no tunneling or undermining noted. There is a medium amount of serosanguineous drainage noted. There is medium (34-66%) red granulation within the wound bed. There is a medium (34-66%) amount of necrotic tissue within the wound bed including Adherent Slough. 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 There was a Four Layer Compression Therapy Procedure with a pre-treatment ABI of 1.2 by Montey Hora, RN. Post procedure Diagnosis Wound #31: Same as Pre-Procedure Wound #32 Pre-procedure diagnosis of Wound #32 is a Diabetic Wound/Ulcer of the Lower Extremity located on the Left,Lateral Lower Leg . There was a Four Layer Compression Therapy Procedure with a  pre-treatment ABI of 1.2 by Montey Hora, RN. 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 There was a Four Layer Compression Therapy Procedure with a pre-treatment ABI of 1.2 by Montey Hora, RN. Post procedure Diagnosis Wound #33: Same as Pre-Procedure Wound #34 Pre-procedure diagnosis of Wound #34 is a Diabetic Wound/Ulcer of the Lower Extremity located on the Left,Posterior Lower Leg . There was a Four Layer Compression Therapy Procedure with a pre-treatment ABI of 1.2 by Montey Hora, RN. Post procedure Diagnosis Wound #34: Same as Pre-Procedure Plan Wound Cleansing: Wound #31 Right,Anterior Lower Leg: Clean wound with Normal Saline. May shower with protection. - Please do not get your wraps wet Wound #32 Left,Lateral Lower Leg: Clean wound with Normal Saline. May shower with protection. - Please do not get your wraps wet Wound #33 Left,Medial Lower Leg: Clean wound with Normal Saline. May shower with protection. - Please do not get your wraps wet Wound #34 Left,Posterior Lower Leg: Adam Merritt, Adam Merritt. ([id number]) Clean wound with Normal Saline. May shower with protection. - Please do not get your wraps wet Skin Barriers/Peri-Wound Care: Moisturizing lotion - to both lower legs Primary Wound Dressing: Wound #31 Right,Anterior Lower Leg: Silver Alginate Wound #32 Left,Lateral Lower Leg: Silver Alginate Wound #33 Left,Medial Lower Leg: Silver Alginate Wound #34 Left,Posterior Lower Leg: Silver Alginate Secondary Dressing: Wound #31 Right,Anterior Lower Leg: XtraSorb Wound #32 Left,Lateral Lower Leg: XtraSorb Wound #33 Left,Medial Lower Leg: XtraSorb Wound #34 Left,Posterior Lower Leg: XtraSorb Dressing Change Frequency: Change dressing every week - Please call for nurse visit if your wraps start to slide down your  legs Follow-up Appointments: Return Appointment in 1 week. Nurse Visit as needed - Please call for nurse visit if your wraps start to slide down your legs Edema Control: Wound #31 Right,Anterior Lower Leg: 4 Layer Compression System - Bilateral Elevate legs to the level of the heart and pump ankles as  often as possible Compression Pump: Use compression pump on left lower extremity for 60 minutes, twice daily. Compression Pump: Use compression pump on right lower extremity for 60 minutes, twice daily. Wound #32 Left,Lateral Lower Leg: 4 Layer Compression System - Bilateral Elevate legs to the level of the heart and pump ankles as often as possible Compression Pump: Use compression pump on left lower extremity for 60 minutes, twice daily. Compression Pump: Use compression pump on right lower extremity for 60 minutes, twice daily. Wound #33 Left,Medial Lower Leg: 4 Layer Compression System - Bilateral Elevate legs to the level of the heart and pump ankles as often as possible Compression Pump: Use compression pump on left lower extremity for 60 minutes, twice daily. Compression Pump: Use compression pump on right lower extremity for 60 minutes, twice daily. Wound #34 Left,Posterior Lower Leg: 4 Layer Compression System - Bilateral Elevate legs to the level of the heart and pump ankles as often as possible Compression Pump: Use compression pump on left lower extremity for 60 minutes, twice daily. Compression Pump: Use compression pump on right lower extremity for 60 minutes, twice daily. #1 well-known patient to this clinic. He is really not being compliant with his external compression pumps and I emphasized this to him today. 2. Uncontrolled lymphedema left greater than right 3. We dressed him with silver alginate, drawtex, ABDs under 4-layer compression bilaterally. 4. Although we have not been able to do arterial studies or ABIs on him in quite a while. His peripheral pulses are actually  palpable. I am doubtful he has a significant arterial issue. We could verify this with TBI's however I do not feel strongly about this at the moment. 5. We will see him back in follow-up next week. Emphasized keeping his legs elevated 6. No clear evidence of CHF Electronic Signature(s) Signed: 09/26/2019 11:57:48 AM By: Linton Ham MD Entered By: Linton Ham on 09/26/2019 10:45:04 Adam Merritt ([id number]) -------------------------------------------------------------------------------- ROS/PFSH Details Patient Name: Adam Merritt Date of Service: 09/26/2019 9:45 AM Medical Record Number: [id number] Patient Account Number: [id number] Date of Birth/Sex: 14-Nov-1945 (74 y.o. M) Treating RN: Adam Merritt Primary Care Provider: [name] Other Clinician: Referring Provider: Referral, Self Treating Provider/Extender: Tito Dine in Treatment: 0 Information Obtained From Patient Constitutional Symptoms (General Health) Complaints and Symptoms: Negative for: Fatigue; Fever; Chills; Marked Weight Change Eyes Complaints and Symptoms: Negative for: Dry Eyes; Vision Changes; Glasses / Contacts Medical History: Negative for: Cataracts; Glaucoma; Optic Neuritis Ear/Nose/Mouth/Throat Complaints and Symptoms: Negative for: Difficult clearing ears; Sinusitis Medical History: Negative for: Chronic sinus problems/congestion; Middle ear problems Hematologic/Lymphatic Complaints and Symptoms: Negative for: Bleeding / Clotting Disorders; Human Immunodeficiency Virus Medical History: Positive for: Anemia - aplastic anemia; Lymphedema Negative for: Hemophilia; Human Immunodeficiency Virus; Sickle Cell Disease Cardiovascular Complaints and Symptoms: Negative for: Chest pain; LE edema Medical History: Positive for: Congestive Heart Failure; Coronary Artery Disease; Hypertension; Peripheral Venous Disease Negative for: Angina; Arrhythmia; Deep Vein Thrombosis; Hypotension;  Myocardial Infarction; Peripheral Arterial Disease; Phlebitis; Vasculitis Past Medical History Notes: varicose veins Gastrointestinal Complaints and Symptoms: Negative for: Frequent diarrhea; Nausea; Vomiting Medical History: Negative for: Cirrhosis ; Colitis; Crohnos; Hepatitis A; Hepatitis B; Hepatitis C Endocrine Complaints and Symptoms: Negative for: Hepatitis; Thyroid disease; Polydypsia (Excessive Thirst) Medical History: Positive for: Type II Diabetes Negative for: Type I Diabetes MICKAL, MENO ([id number]) Time with diabetes: since 2006 Treated with: Insulin Blood sugar tested every day: Yes Tested : Blood sugar testing results: Breakfast: 209 Genitourinary Complaints and Symptoms: Negative  for: Kidney failure/ Dialysis; Incontinence/dribbling Medical History: Negative for: End Stage Renal Disease Immunological Complaints and Symptoms: Negative for: Hives; Itching Medical History: Negative for: Lupus Erythematosus; Raynaudos; Scleroderma Integumentary (Skin) Complaints and Symptoms: Negative for: Wounds; Bleeding or bruising tendency; Breakdown; Swelling Medical History: Negative for: History of Burn; History of pressure wounds Musculoskeletal Complaints and Symptoms: Negative for: Muscle Pain; Muscle Weakness Medical History: Positive for: Osteoarthritis Negative for: Gout; Rheumatoid Arthritis; Osteomyelitis Neurologic Complaints and Symptoms: Negative for: Numbness/parasthesias; Focal/Weakness Medical History: Positive for: Neuropathy Negative for: Dementia; Quadriplegia; Paraplegia; Seizure Disorder Past Medical History Notes: CVA - 1992 Psychiatric Complaints and Symptoms: Negative for: Anxiety; Claustrophobia Medical History: Negative for: Anorexia/bulimia; Confinement Anxiety Respiratory Medical History: Positive for: Chronic Obstructive Pulmonary Disease (COPD); Sleep Apnea Negative for: Aspiration; Asthma; Pneumothorax; Tuberculosis Past  Medical History Notes: home O2 at night as needed Oncologic Medical History: Positive for: Received Chemotherapy - last dose 2006 DANTRE, YEARWOOD ([id number]) Negative for: Received Radiation Immunizations Pneumococcal Vaccine: Received Pneumococcal Vaccination: Yes Implantable Devices None Hospitalization / Surgery History Type of Hospitalization/Surgery bronchitis Family and Social History Cancer: Yes - Paternal Grandparents; Diabetes: No; Heart Disease: No; Hereditary Spherocytosis: No; Hypertension: No; Kidney Disease: Yes - Siblings; Lung Disease: Yes - Mother; Seizures: No; Stroke: No; Thyroid Problems: No; Tuberculosis: No; Never smoker; Marital Status - Married; Alcohol Use: Never; Drug Use: No History; Caffeine Use: Never; Financial Concerns: No; Food, Clothing or Shelter Needs: No; Support System Lacking: No; Transportation Concerns: No Electronic Signature(s) Signed: 09/26/2019 11:45:23 AM By: Adam Merritt Signed: 09/26/2019 11:57:48 AM By: Linton Ham MD Entered By: Adam Merritt on 09/26/2019 09:35:06 Adam Merritt ([id number]) -------------------------------------------------------------------------------- SuperBill Details Patient Name: Adam Merritt Date of Service: 09/26/2019 Medical Record Number: [id number] Patient Account Number: [id number] Date of Birth/Sex: Jun 05, 1946 (74 y.o. M) Treating RN: Montey Hora Primary Care Provider: [name] Other Clinician: Referring Provider: Referral, Self Treating Provider/Extender: Tito Dine in Treatment: 0 Diagnosis Coding ICD-10 Codes Code Description I89.0 Lymphedema, not elsewhere classified L97.222 Non-pressure chronic ulcer of left calf with fat layer exposed L97.812 Non-pressure chronic ulcer of other part of right lower leg with fat layer exposed Facility Procedures CPT4: Description Modifier Quantity Code [id number] - WOUND CARE VISIT-LEV 3 EST PT 1 CPT4: [id number] BILATERAL:  Application of multi-layer venous compression system; leg (below knee), including ankle 1 and foot. Physician Procedures CPT4 Code: 3491791 Description: 50569 - WC PHYS LEVEL 4 - EST PT Modifier: Quantity: 1 CPT4 Code: Description: ICD-10 Diagnosis Description I89.0 Lymphedema, not elsewhere classified L97.222 Non-pressure chronic ulcer of left calf with fat layer exposed L97.812 Non-pressure chronic ulcer of other part of right lower leg with fat layer Modifier: exposed Quantity: Electronic Signature(s) Signed: 09/26/2019 11:57:48 AM By: Linton Ham MD Entered By: Linton Ham on 09/26/2019 10:45:48

## 2019-10-03 ENCOUNTER — Encounter: Payer: Medicare PPO | Admitting: Physician Assistant

## 2019-10-03 ENCOUNTER — Other Ambulatory Visit: Payer: Self-pay

## 2019-10-03 DIAGNOSIS — I110 Hypertensive heart disease with heart failure: Secondary | ICD-10-CM | POA: Diagnosis not present

## 2019-10-03 DIAGNOSIS — L97812 Non-pressure chronic ulcer of other part of right lower leg with fat layer exposed: Secondary | ICD-10-CM | POA: Diagnosis not present

## 2019-10-03 DIAGNOSIS — I872 Venous insufficiency (chronic) (peripheral): Secondary | ICD-10-CM | POA: Diagnosis not present

## 2019-10-03 DIAGNOSIS — I890 Lymphedema, not elsewhere classified: Secondary | ICD-10-CM | POA: Diagnosis not present

## 2019-10-03 DIAGNOSIS — I509 Heart failure, unspecified: Secondary | ICD-10-CM | POA: Diagnosis not present

## 2019-10-03 DIAGNOSIS — L97519 Non-pressure chronic ulcer of other part of right foot with unspecified severity: Secondary | ICD-10-CM | POA: Diagnosis not present

## 2019-10-03 DIAGNOSIS — E11621 Type 2 diabetes mellitus with foot ulcer: Secondary | ICD-10-CM | POA: Diagnosis not present

## 2019-10-03 DIAGNOSIS — L97222 Non-pressure chronic ulcer of left calf with fat layer exposed: Secondary | ICD-10-CM | POA: Diagnosis not present

## 2019-10-03 DIAGNOSIS — E11622 Type 2 diabetes mellitus with other skin ulcer: Secondary | ICD-10-CM | POA: Diagnosis not present

## 2019-10-03 NOTE — Progress Notes (Addendum)
REMBERT, BROWE ([id number]) Visit Report for 10/03/2019 Chief Complaint Document Details Patient Name: Adam Merritt, Adam Merritt Date of Service: 10/03/2019 3:00 PM Medical Record Number: [id number] Patient Account Number: [id number] Date of Birth/Sex: Jun 19, 1945 (74 y.o. M) Treating RN: Montey Hora Primary Care Provider: [name] Other Clinician: Referring Provider: [name] Treating Provider/Extender: Melburn Hake, Sayuri Rhames [name]: 1 Information Obtained from: Patient Chief Complaint Bilateral LE Ulcers and right second toe ulcer 09/26/2019; patient is here for review bilateral lower leg wounds. He is well familiar to this clinic Electronic Signature(s) Signed: 10/03/2019 3:06:35 PM By: Worthy Keeler PA-C Entered By: Worthy Keeler on 10/03/2019 15:06:34 Adam Merritt ([id number]) -------------------------------------------------------------------------------- Debridement Details Patient Name: Adam Merritt Date of Service: 10/03/2019 3:00 PM Medical Record Number: [id number] Patient Account Number: [id number] Date of Birth/Sex: 05-02-1946 (74 y.o. M) Treating RN: Montey Hora Primary Care Provider: [name] Other Clinician: Referring Provider: [name] Treating Provider/Extender: Melburn Hake, Enrique Weiss [name]: 1 Debridement Performed for Wound #31 Right,Anterior Lower Leg Assessment: Performed By: Physician STONE III, Talan Gildner E., PA-C Debridement Type: Chemical/Enzymatic/Mechanical Agent Used: normal saline Severity of Tissue Pre Debridement: Fat layer exposed Level of Consciousness (Pre- Awake and Alert procedure): Pre-procedure Verification/Time Out Yes - 15:30 Taken: Start Time: 15:30 Pain Control: Lidocaine 4% Topical Solution Instrument: Other : gauze Bleeding: None End Time: 15:31 Procedural Pain: 0 Post Procedural Pain: 0 Response to Treatment: Procedure was tolerated well Level of Consciousness (Post- Awake and Alert procedure): Post  Debridement Measurements of Total Wound Length: (cm) 2 Width: (cm) 5 Depth: (cm) 0.1 Volume: (cm) 0.785 Character of Wound/Ulcer Post Debridement: Improved Severity of Tissue Post Debridement: Fat layer exposed Post Procedure Diagnosis Same as Pre-procedure Electronic Signature(s) Signed: 10/03/2019 4:11:07 PM By: Montey Hora Signed: 10/03/2019 5:05:02 PM By: Worthy Keeler PA-C Entered By: Montey Hora on 10/03/2019 16:11:07 Adam Merritt ([id number]) -------------------------------------------------------------------------------- Debridement Details Patient Name: Adam Merritt Date of Service: 10/03/2019 3:00 PM Medical Record Number: [id number] Patient Account Number: [id number] Date of Birth/Sex: 16-May-1946 (74 y.o. M) Treating RN: Montey Hora Primary Care Provider: [name] Other Clinician: Referring Provider: [name] Treating Provider/Extender: Melburn Hake, Mimie Goering [name]: 1 Debridement Performed for Wound #32 Left,Lateral Lower Leg Assessment: Performed By: Physician STONE III, Junior Huezo E., PA-C Debridement Type: Chemical/Enzymatic/Mechanical Agent Used: normal saline Severity of Tissue Pre Debridement: Fat layer exposed Level of Consciousness (Pre- Awake and Alert procedure): Pre-procedure Verification/Time Out Yes - 15:31 Taken: Start Time: 15:31 Pain Control: Lidocaine 4% Topical Solution Instrument: Other : gauze Bleeding: None End Time: 15:32 Procedural Pain: 0 Post Procedural Pain: 0 Response to Treatment: Procedure was tolerated well Level of Consciousness (Post- Awake and Alert procedure): Post Debridement Measurements of Total Wound Length: (cm) 6.5 Width: (cm) 8 Depth: (cm) 0.1 Volume: (cm) 4.084 Character of Wound/Ulcer Post Debridement: Improved Severity of Tissue Post Debridement: Fat layer exposed Post Procedure Diagnosis Same as Pre-procedure Electronic Signature(s) Signed: 10/03/2019 4:11:43 PM By: Montey Hora Signed: 10/03/2019 5:05:02 PM By: Worthy Keeler PA-C Entered By: Montey Hora on 10/03/2019 16:11:42 Adam Merritt ([id number]) -------------------------------------------------------------------------------- HPI Details Patient Name: Adam Merritt Date of Service: 10/03/2019 3:00 PM Medical Record Number: [id number] Patient Account Number: [id number] Date of Birth/Sex: [birth year] (74 y.o. M) Treating RN: Montey Hora Primary Care Provider: [name] Other Clinician: Referring Provider: [name] Treating Provider/Extender: Melburn Hake, [name] [name] [name]: 1 History of Present Illness HPI Description: 10/18/17-He is here for initial evaluation of bilateral lower  extremity ulcerations in the presence of venous insufficiency and lymphedema. He has been seen by vascular medicine in the past, Dr. Lucky Cowboy, last seen in 2016. He does have a history of abnormal ABIs, which is to be expected given his lymphedema and venous insufficiency. According to Epic, it appears that all attempts for arterial evaluation and/or angiography were not follow through with by patient. He does have a history of being seen in lymphedema clinic in 2018, stopped going approximately 6 months ago stating "it didn't do any good". He does not have lymphedema pumps, he does not have custom fit compression wrap/stockings. He is diabetic and his recent A1c last month was 7.6. He admits to chronic bilateral lower extremity pain, no change in pain since blister and ulceration development. He is currently being treated with Levaquin for bronchitis. He has home health and we will continue. 10/25/17-He is here in follow-up evaluation for bilateral lower extremity ulcerationssubtle he remains on Levaquin for bronchitis. Right lower extremity with no evidence of drainage or ulceration, persistent left lower extremity ulceration. He states that home health has not been out since his appointment. He went to [facility] on Tuesday, studies revealed: RIGHT ABI 0.9, TBI 0.6 LEFT ABI 1.1, TBI 0.6 with triphasic flow bilaterally. We will continue his same treatment plan. He has been educated on compression therapy and need for elevation. He will benefit from lymphedema pumps 11/01/17-He is here in follow-up evaluation for left lower extremity ulcer. The right lower extremity remains healed. He has home health services, but they have not been out to see the patient for 2-3 weeks. He states it home health physical therapy changed his dressing yesterday after therapy; he placed Ace wrap compression. We are still waiting for lymphedema pumps, reordered d/t need for company change. 11/08/17-He is here in follow-up evaluation for left lower extremity ulcer. It is improved. Edema is significantly improved with compression therapy. We will continue with same treatment plan and he will follow-up next week. No word regarding lymphedema pumps 11/15/17-He is here in follow-up evaluation for left lower extremity ulcer. He is healed and will be discharged from wound care services. I have reached out to [facility] regarding his lymphedema pumps. They have been unable to reach the patient; the contact number they had with the patient's wife's cell phone and she has not answered any unrecognized calls. Contact should be made today, trial planned for next week; [facility] will continue to follow 11/27/17 on evaluation today patient has multiple blistered areas over the right lower extremity his left lower extremity appears to be doing okay. These blistered areas show signs of no infection which is great news. With that being said he did have some necrotic skin overlying which was mechanically debrided away with saline and gauze today without complication. Overall post debridement the wounds appear to be doing better but in general his swelling seems to be increased. This is obviously not good news. I think this is  what has given rise to the blisters. 12/04/17 on evaluation today patient presents for follow-up concerning his bilateral lower extremity edema in the right lower extremity ulcers. He has been tolerating the dressing changes without complication. With that being said he has had no real issues with the wraps which is also good news. Overall I'm pleased with the progress he's been making. 12/11/17 on evaluation today patient appears to be doing rather well in regard to his right lateral lower extremity ulcer. He's been tolerating the dressing  changes without complication. Fortunately there does not appear to be any evidence of infection at this time. Overall I'm pleased with the progress that is being made. Unfortunately he has been in the hospital due to having what sounds to be a stomach virus/flu fortunately that is starting to get better. 12/18/17 on evaluation today patient actually appears to be doing very well in regard to his bilateral lower extremities the swelling is under fairly good control his lymphedema pumps are still not up and running quite yet. With that being said he does have several areas of opening noted as far as wounds are concerned mainly over the left lower extremity. With that being said I do believe once he gets lymphedema pumps this would least help you mention some the fluid and preventing this from occurring. Hopefully that will be set up soon sleeves are Artie in place at his home he just waiting for the machine. 12/25/17 on evaluation today patient actually appears to be doing excellent in fact all of his ulcers appear to have resolved his legs appear very well. I do think he needs compression stockings we have discussed this and they are actually going to go to [place] today to elastic therapy to get this fitted for him. I think that is definitely a good thing to do. Readmission: 04/09/18 upon evaluation today this patient is seen for readmission due to bilateral lower  extremity lymphedema. He has significant swelling of his extremities especially on the left although the right is also swollen he has weeping from both sides. There are no obvious open wounds at this point. Fortunately he has been doing fairly well for quite a bit of time since I last saw him. Nonetheless unfortunately this seems to have reopened and is giving quite a bit of trouble. He states this began about a week ago when he first called [place] to get in to be seen. No fevers, chills, nausea, or vomiting noted at this time. He has not been using his lymphedema pumps due to the fact that they won't fit on his leg at this point likewise is also not been using his compression for essentially the same reason. 04/16/18 upon evaluation today patient actually appears to be doing a little better in regard to the fluid in his bilateral lower extremities. With that being said he's had three falls since I saw him last week. He also states that he's been feeling very poorly. I was concerned last week and feel like that the concern is still there as far as the congestion in his chest is concerned he seems to be breathing about the same as last week but again he states he's very weak he's not even able to walk further than from the chair to the door. His wife had to buy a wheelchair just to be able to get them out of the house to get to the appointment today. This has me very concerned. 04/23/18 on evaluation today patient actually appears to be doing much better than last week's evaluation. At that time actually had to transport him to the ER via EMS and he subsequently was admitted for acute pulmonary edema, acute renal failure, and acute congestive heart failure. Fortunately he is doing much better. Apparently they did dialyze him and were able to take off roughly 35 pounds of fluid. Nonetheless he is feeling much better both in regard to his breathing and he's able to get around much better at this time compared  to previous. Overall I'm  very ANSEN, SAYEGH ([id number]) happy with how things are at this time. There does not appear to be any evidence of infection currently. No fevers, chills, nausea, or vomiting noted at this time. 04/30/2018 patient seen today for follow-up and management of bilateral lower extremity lymphedema. He did express being more sad today than usual due to the recent loss of his dog. He states that he has been compliant with using the lymphedema pumps. However he does admit a minute over the last 2-3 days he has not been using the pumps due to the recent loss of his dog. At this time there is no drainage or open wounds to his lower extremities. The left leg edema is measuring smaller today. Still has a significant amount of edema on bilateral lower extremities With dry flaky skin. He will be referred to the lymphedema clinic for further management. Will continue 3 layer compression wraps and follow-up in 1-2 weeks.Denies any pain, fever, chairs recently. No recent falls or injuries reported during this visit. 05/07/18 on evaluation today patient actually appears to be doing very well in regard to his lower extremities in general all things considered. With that being said he is having some pain in the legs just due to the amount of swelling. He does have an area where he had a blister on the left lateral lower extremity this is open at this point other than that there's nothing else weeping at this time. 05/14/18 on evaluation today patient actually appears to be doing excellent all things considered in regard to his lower extremities. He still has a couple areas of weeping on each leg which has continued to be the issue for him. He does have an appointment with the lymphedema clinic although this isn't until February 2020. That was the earliest they had. In the meantime he has continued to tolerate the compression wraps without complication. 05/28/18 on evaluation today patient  actually appears to be doing more poorly in regard to his left lower extremity where he has a wound open at this point. He also had a fall where he subsequently injured his right great toe which has led to an open wound at the site unfortunately. He has been tolerating the dressing changes without complication in general as far as the wraps are concerned that he has not been putting any dressing on the left 1st toe ulcer site. 06/11/18 on evaluation today patient appears to be doing much worse in regard to his bilateral lower extremity ulcers. He has been tolerating the dressing changes without complication although his legs have not been wrapped more recently. Overall I am not very pleased with the way his legs appear. I do believe he needs to be back in compression wraps he still has not received his compression wraps from the [facility] hospital as of yet. 06/18/18 on evaluation today patient actually appears to be doing significantly better than last time I saw him. He has been tolerating the compression wraps without complication in the circumferential ulcers especially appear to be doing much better. His toe ulcer on the right in regard to the great toe is better although not as good as the legs in my pinion. No fevers chills noted 07/02/18 on evaluation today patient appears to be doing much better in regard to his lower extremity ulcers. Unfortunately since I last saw him he's had the distal portion of his right great toe if he dated it sounds as if this actually went downhill very quickly. I had only  seen him a few days prior and the toe did not appear to be infected at that point subsequently became infected very rapidly and it was decided by the surgeon that the distal portion of the toe needed to be removed. The patient seems to be doing well in this regard he tells me. With that being said his lower extremities are doing better from the standpoint of the wounds although he is significantly swollen at  this point. 07/09/18 on evaluation today patient appears to be doing better in regard to the wounds on his lower extremities. In fact everything is almost completely healed he is just a small area on the left posterior lower extremity that is open at this point. 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 With that being said he needs some kind of compression it does sound like he may not be wearing his compression, that is the wraps, during the entire time between when he's here visit to visit. Apparently his wife took the current one off because it began to "fall apart". 07/16/18 on evaluation today patient appears to be extremely swollen especially in regard to his left lower extremity unfortunately. He also has a new skin tear over the left lower extremity and there's a smaller area on the right lower extremity as well. Unfortunately this seems to be due in part to blistering and fluid buildup in his leg. He did get the reduction wraps that were ordered by the [facility] hospital for him to go to lymphedema clinic. With that being said his wounds on the legs have not healed to the point to where they would likely accept them as a patient lymphedema clinic currently. We need to try to get this to heal. With that being said he's been taking his wraps off which is not doing him any favors at this point. In fact this is probably quite counterproductive compared to what needs to occur. We will likely need to increase to a four layer compression wrap and continue to also utilize elevation and he has to keep the wraps on not take them off as he's been doing currently hasn't had a wrap on since Saturday. 07/23/18 on evaluation today patient appears to be doing much better in regard to his bilateral lower extremities. In fact his left  lower extremity which was the largest is actually 15 cm smaller today compared to what it was last time he was here in our clinic. This is obviously good news after just one week. Nonetheless the differences he actually kept the wraps on during the entire week this time. That's not typical for him. I do believe he understands a little bit better now the severity of the situation and why it's important for him to keep these wraps on. 07/30/18 on evaluation today patient actually appears to be doing rather well in regard to his lower extremities. His legs are much smaller than they have been in the past and he actually has only one very small rudder superficial region remaining that is not closed on the left lateral/posterior lower extremity even this is almost completely close. I do believe likely next week he will be healed without any complications. I do think we need to continue the wraps however this seems to be beneficial for him. I also think it may be a good  time for [place] to go ahead and see about getting the appointment with the lymphedema clinic which is supposed to be made for him in order to keep this moving along and hopefully get them into compression wraps that will in the end help him to remain healed. 08/06/18 on evaluation today patient actually appears to be doing very well in regard as bilateral lower extremities. In fact his wounds appear to be completely healed at this time. 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 He is in the process of getting appointment with the lymphedema clinic we have made this referral were just waiting to hear back on the schedule time. We need to follow up on that today as well. 08/13/18 on evaluation today patient actually appears to be doing very well in regard to his bilateral lower extremities there are no open wounds at this point. We are gonna go ahead and see about ordering the Velcro  compression wraps for him had a discussion with them about [place] doing it versus the [place] they feel like they can definitely afford going ahead and get the wraps themselves and they would prefer to try to avoid having to go to the lymphedema clinic if it all possible which I completely understand. As long as he has good compression I'm okay either way. 3/17//20 on evaluation today patient actually appears to be doing well in regard to his bilateral lower extremity ulcers. He has been tolerating the dressing changes without complication specifically the compression wraps. Overall is had no issues my fingers finding that I see at this point is that he is having trouble with constipation. He tells me he has not been able to go to the bathroom for about six days. He's taken to over-the- counter oral laxatives unfortunately this is not helping. He has not contacted his doctor. Adam Merritt, Adam Merritt ([id number]) 08/27/18 on evaluation today patient appears to be doing fairly well in regard to his lower extremities at this point. There does not appear to be any new altars is swelling is very well controlled. We are still waiting for his Velcro compression wraps to arrive that should be sometime in the next week is Artie sent the check for these. 09/03/18 on evaluation today patient appears to be doing excellent in regard to his bilateral lower extremities which he shows no signs of wound openings in regard to this point. He does have his Velcro compression wraps which did arrive in the mail since I saw him last week. Overall he is doing excellent in my pinion. 09/13/18 on evaluation today patient appears to be doing very well currently in regard to the overall appearance of his bilateral lower extremities although he's a little bit more swollen than last time we saw him. At that point he been discharged without any open wounds. Nonetheless he has a small open wound on the posterior left lower extremity with some evidence  of cellulitis noted as well. Fortunately I feel like he has made good progress overall with regard to his lower extremities from were things used to be. 09/20/18 on evaluation today patient actually appears to be doing much better. The erythematous lower extremity is improving wound itself which is still open appears to be doing much better as far as but appearance as well as pain is concerned overall very pleased in this regard. There's no signs of active infection at this time. 09/27/18 on evaluation today patient's wounds on the lower extremity  actually appear to be doing fairly well at this time which is good news. There is no evidence of active infection currently and again is just as left lower extremity were there any wounds at this point anyway. I believe they may be completely healed but again I'm not 100% sure based on evaluation today. I think one more week of observation would likely be a good idea. 10/04/18 on evaluation today patient actually appears to be doing excellent in regard to the left lower extremity which actually appears to be completely healed as of today. Unfortunately he's been having issues with his right lower extremity have a new wound that has opened. Fortunately there's no evidence of active infection at this time which is good news. 10/10/18 upon evaluation today patient actually appears to be doing a little bit worse with the new open area on his right posterior lower extremity. He's been tolerating the dressing changes without complication. Right now we been using his compression wraps although I think we may need to switch back to actually performing bilateral compression wraps are in the clinic. No fevers, chills, nausea, or vomiting noted at this time. 10/17/18 on evaluation today patient actually appears to be doing quite well in regard to his bilateral lower extremity ulcers. He has been tolerating the reps without complication although he would prefer not to rewrap  his legs as of today. Fortunately there's no signs of active infection which is good news. No fevers, chills, nausea, or vomiting noted at this time. 10/24/18 on evaluation today patient appears to be doing very well in regard to his lower extremities. His right lower extremity is shown signs of healing and his left lower Trinity though not healed appears to be improving which is excellent news. Overall very pleased with how things seem to be progressing at this time. The patient is likewise happy to hear this. His Velcro compression wraps however have not been put on properly will gonna show his wife how to do that properly today. 10/31/18 on evaluation today patient appears to be doing more poorly in regard to his left lower extremity in particular although both lower extremities actually are showing some signs of being worse than my previous evaluation. Unfortunately I'm just not sure that his compression stockings even with the use of the compression/lymphedema pumps seem to be controlling this well. Upon further questioning he tells me that he also is not able to lie flat in the bed due to his congestive heart failure and difficulty breathing. For that reason he sleeps in his recliner. To make matters worse his recliner also cannot even hold his legs up so instead of even being somewhat elevated they pretty much hang down to the floor. This is the way he sleeps each night which is definitely counterproductive to everything else that were attempting to do from the standpoint of controlling his fluid. Nonetheless I think that he potentially could benefit from a hospital bed although this would be something that his primary care provider would likely have to order since anything that is order on our side has to be directly related to wound care and again the hospital bed is not necessarily a direct relation to although I think it does contribute to his overall wound status on his lower  extremities. 11/07/18 on evaluation today patient appears to be doing better in regard to his bilateral lower extremity. He's been tolerating the dressing changes without complication. We did in the interim since I last saw him switch  to just using extras orbiting new alginate and that seems to have done much better for him. I'm very pleased with the overall progress that is made. 11/14/18 on evaluation today patient appears to be redoing rather well in regard to his left lower extremity ulcers which are the only ones remaining at this point. Fortunately there's no signs of active infection at this time which is good news. Overall been very pleased with how things seem to be progressing currently. No fevers, chills, nausea, or vomiting noted at this time. 11/20/17 on evaluation today patient actually appears to be doing quite well in regard to his lower extremities on the left on the right he has several blisters that showed up although there's some question about whether or not he has had his broker compression wraps on like he was supposed to or not. Fortunately there's no signs of active infection at this time which is good news. Unfortunately though he is doing well from the standpoint of the left leg the right leg is not doing as well again this is when we did not wrap last week. 11/28/18 on evaluation today patient appears to be doing well in regard to his bilateral lower extremities is left appears to be healed is right is not healed but is very close to being so. Overall very pleased with how things seem to be progressing. Patient is likewise happy that things are doing well. 12/09/18 on evaluation today patient actually appears to be completely healed which is excellent news. He actually seems to be doing well in regard to the swelling of the bilateral lower extremities which is also great news. Overall very pleased with how things seem to be progressing. Readmission: 03/18/2019 upon evaluation  today patient presents for reevaluation concerning issues that he is having with his left lower extremity where he does have a wound noted upon inspection today. He also has a wound on the right second toe on the tip where it is apparently been rubbing in his shoe I did look at issues and you can actually see where this has been occurring as well. Fortunately there is no signs of infection with regard to the toe necessarily although it is very swollen compared to normal that does have me somewhat concerned about the possibility of further evaluation for infection/osteomyelitis. I would recommend an x-ray to start with. Otherwise he states that it is been 1-2 weeks that he has had the draining in regard to left lower extremity he does not know how this happened he has been wearing his compression appropriately and I do see that as well today but nonetheless I do think that he needs to continue to be very cautious with regard to elevation as well. Adam Merritt, Adam Merritt ([id number]) 03/25/2019 on evaluation today patient appears to be doing much better in regard to his lower extremity at this time. That is on the left. He did culture positive for Pseudomonas but the good news is he seems to be doing much better the gentamicin we applied topically seems to have done a great job for him. There is no signs of active infection at this time systemically and locally he is doing much better. No fevers, chills, nausea, vomiting, or diarrhea. In regard to his right toe this seems to be doing much better there is very little pressure noted at this point I did clean up some of the callus today around the edges of the wound as well as the surface of the wound but to  be honest he is progressing quite nicely. 10/30; the area on the left anterior lower tibia area is healed over. His edema control is adequate even though his use of the compression pumps seems very intermittent. He has a juxta lite stocking on the right leg and  a think there is 1 for the left leg at home. His wife is putting these on. He has an area on the plantar right second toe which is a hammertoe they are trying to offload this 04/08/2019 on evaluation today patient appears to be doing well in regard to his lower extremities which is good news. We are seeing him today for his toe ulcer which is giving him a little bit more trouble but again still seems to be doing better at this point which is good news. He is going require some sharp debridement in regard to the toe today however. 04/15/2019 on evaluation today patient actually appears to be doing quite well with regard to his toe ulcer. I am very pleased with how things are doing in that regard. With regard to his lower extremity edema in general he tells me he is not been using the lymphedema pumps regularly although he does not use them. I think that he needs to use them more regularly he does have a small blister on the left lower extremity this is not open at this point but obviously it something that can get worse if he does not keep the compression going and the pumps going as well. 04/22/2019 on evaluation today patient appears to be doing well with regard to his toe ulcer. He has been tolerating the dressing changes without complication. This is measuring slightly smaller this week as compared to last week. Fortunately there is no signs of active infection at this time. 05/06/2019 on evaluation today patient actually appears to be doing excellent. In fact his toe appears to be completely healed which is great news. There is no signs of active infection at this time. Readmission: Patient presents today for follow-up after having been discharged at the beginning of December 2020. He states that in recent weeks things have reopened and has been having more trouble at this point. With that being said fortunately there is no signs of active infection at this time. No fever chills noted. Nonetheless  he also does have an issue with one of his toes as well that seems to be somewhat this is on the right foot second toe. 07/25/2019 upon evaluation today patient's lower extremities appear to be doing excellent bilaterally. I really feel like he is showing signs of great improvement and in fact I think he is close to healing which is great news. There is no signs of active infection at this time. No fevers, chills, nausea, vomiting, or diarrhea. 07/31/2019 upon evaluation today patient appears to be doing excellent and in fact I think may be completely healed in regard to his right lower extremity that were still to monitor this 1 more week before closing it out. The left lower extremity is measuring smaller although not completely closed seems to be doing excellent. 08/07/2019 upon evaluation today patient appears to be doing excellent in regard to his wounds. In fact the right lower extremity is completely healed there is no issues here. The left lower extremity has 1 very small area still remaining fortunately there is no signs of infection and overall I think he is very close to closure of the here as well. 08/14/19 upon evaluation today patient appears  to be doing excellent in regard to his lower extremities. In fact he appears to be completely healed as of today. Fortunately there's no signs of active infection at this time. No fevers, chills, nausea, or vomiting noted at this time. READMISSION 09/26/2019 This is a 75 year old man who is well-known to this clinic having just been discharged on 08/14/2019. He has known lymphedema and chronic venous insufficiency. He has Farrow wrap stockings and also external compression pumps. He does not use the external compression pumps however. He does however apparently fairly faithfully use the stockings. They have not been lotion in his legs. He has developed new wounds on the right anterior as well as the left medial left lateral and left posterior calf. He  returns for our review of this Past medical history includes coronary artery disease, hypertension, congestive heart failure, COPD, venous insufficiency with lymphedema, type 2 diabetes. He apparently has compression pumps, Farrow wraps and at 1 point a hospital bed although I believe he sleeps in a recliner 10/03/2019 upon evaluation today patient appears to be doing better with regard to his wounds in general. He has been tolerating the dressing changes without complication. Fortunately there is no signs of active infection. No fevers, chills, nausea, vomiting, or diarrhea. I do believe the compression wraps are helping as they always have in the past. Electronic Signature(s) Signed: 10/03/2019 4:22:53 PM By: Worthy Keeler PA-C Entered By: Worthy Keeler on 10/03/2019 16:22:53 Adam Merritt, Adam Merritt ([id number]) -------------------------------------------------------------------------------- Physical Exam Details Patient Name: Adam Merritt Date of Service: 10/03/2019 3:00 PM Medical Record Number: [id number] Patient Account Number: [id number] Date of Birth/Sex: March 15, 1946 (74 y.o. M) Treating RN: Montey Hora Primary Care Provider: [name] Other Clinician: Referring Provider: [name] Treating Provider/Extender: STONE III, Laree Garron [name]: 1 Constitutional Obese and well-hydrated in no acute distress. Respiratory normal breathing without difficulty. Psychiatric this patient is able to make decisions and demonstrates good insight into disease process. Alert and Oriented x 3. pleasant and cooperative. Notes Upon inspection patient's wounds again did not require sharp debridement other than just mechanical debridement with saline and gauze which I did perform today. He tolerated that with no pain and things appear to be doing much better I am very pleased in this regard. Electronic Signature(s) Signed: 10/03/2019 4:23:09 PM By: Worthy Keeler PA-C Entered By: Worthy Keeler  on 10/03/2019 16:23:09 Adam Merritt ([id number]) -------------------------------------------------------------------------------- Physician Orders Details Patient Name: Adam Merritt Date of Service: 10/03/2019 3:00 PM Medical Record Number: [id number] Patient Account Number: [id number] Date of Birth/Sex: 01/20/1946 (74 y.o. M) Treating RN: Montey Hora Primary Care Provider: [name] Other Clinician: Referring Provider: [name] Treating Provider/Extender: Melburn Hake, Sarayah Bacchi [name]: 1 Verbal / Phone Orders: No Diagnosis Coding ICD-10 Coding Code Description I89.0 Lymphedema, not elsewhere classified L97.222 Non-pressure chronic ulcer of left calf with fat layer exposed L97.812 Non-pressure chronic ulcer of other part of right lower leg with fat layer exposed Wound Cleansing Wound #31 Right,Anterior Lower Leg o Clean wound with Normal Saline. o May shower with protection. - Please do not get your wraps wet Wound #32 Left,Lateral Lower Leg o Clean wound with Normal Saline. o May shower with protection. - Please do not get your wraps wet Wound #33 Left,Medial Lower Leg o Clean wound with Normal Saline. o May shower with protection. - Please do not get your wraps wet Skin Barriers/Peri-Wound Care o Moisturizing lotion - to both lower legs Primary Wound Dressing Wound #31 Right,Anterior  Lower Leg o Silver Alginate Wound #32 Left,Lateral Lower Leg o Silver Alginate Wound #33 Left,Medial Lower Leg o Silver Alginate Secondary Dressing Wound #31 Right,Anterior Lower Leg o ABD pad - carboflex Wound #32 Left,Lateral Lower Leg o ABD pad - carboflex Wound #33 Left,Medial Lower Leg o ABD pad - carboflex Dressing Change Frequency o Change dressing every week - Please call for nurse visit if your wraps start to slide down your legs Follow-up Appointments o Return Appointment in 1 week. o Nurse Visit as needed - Please call for nurse  visit if your wraps start to slide down your legs Edema Control Wound #31 Right,Anterior Lower Leg o 4 Layer Compression System - Bilateral o Elevate legs to the level of the heart and pump ankles as often as possible o Compression Pump: Use compression pump on left lower extremity for 60 minutes, twice daily. o Compression Pump: Use compression pump on right lower extremity for 60 minutes, twice daily. Adam Merritt, Adam Merritt ([id number]) Wound #32 Left,Lateral Lower Leg o 4 Layer Compression System - Bilateral o Elevate legs to the level of the heart and pump ankles as often as possible o Compression Pump: Use compression pump on left lower extremity for 60 minutes, twice daily. o Compression Pump: Use compression pump on right lower extremity for 60 minutes, twice daily. Wound #33 Left,Medial Lower Leg o 4 Layer Compression System - Bilateral o Elevate legs to the level of the heart and pump ankles as often as possible o Compression Pump: Use compression pump on left lower extremity for 60 minutes, twice daily. o Compression Pump: Use compression pump on right lower extremity for 60 minutes, twice daily. Electronic Signature(s) Signed: 10/03/2019 4:19:26 PM By: Montey Hora Signed: 10/03/2019 5:05:02 PM By: Worthy Keeler PA-C Entered By: Montey Hora on 10/03/2019 15:35:22 Adam Merritt ([id number]) -------------------------------------------------------------------------------- Problem List Details Patient Name: Adam Merritt Date of Service: 10/03/2019 3:00 PM Medical Record Number: [id number] Patient Account Number: [id number] Date of Birth/Sex: 1945/12/03 (74 y.o. M) Treating RN: Montey Hora Primary Care Provider: [name] Other Clinician: Referring Provider: [name] Treating Provider/Extender: Melburn Hake, Celvin Taney [name]: 1 Active Problems ICD-10 Encounter Code Description Active Date MDM Diagnosis I89.0 Lymphedema, not elsewhere  classified 09/26/2019 No Yes L97.222 Non-pressure chronic ulcer of left calf with fat layer exposed 09/26/2019 No Yes L97.812 Non-pressure chronic ulcer of other part of right lower leg with fat layer 09/26/2019 No Yes exposed Inactive Problems Resolved Problems Electronic Signature(s) Signed: 10/03/2019 3:06:28 PM By: Worthy Keeler PA-C Entered By: Worthy Keeler on 10/03/2019 15:06:28 Adam Merritt ([id number]) -------------------------------------------------------------------------------- Progress Note Details Patient Name: Adam Merritt Date of Service: 10/03/2019 3:00 PM Medical Record Number: [id number] Patient Account Number: [id number] Date of Birth/Sex: 12-30-1945 (74 y.o. M) Treating RN: Montey Hora Primary Care Provider: [name] Other Clinician: Referring Provider: [name] Treating Provider/Extender: Melburn Hake, Salvatore Poe [name]: 1 Subjective Chief Complaint Information obtained from Patient Bilateral LE Ulcers and right second toe ulcer 09/26/2019; patient is here for review bilateral lower leg wounds. He is well familiar to this clinic History of Present Illness (HPI) 10/18/17-He is here for initial evaluation of bilateral lower extremity ulcerations in the presence of venous insufficiency and lymphedema. He has been seen by vascular medicine in the past, Dr. Lucky Cowboy, last seen in 2016. He does have a history of abnormal ABIs, which is to be expected given his lymphedema and venous insufficiency. According to Epic, it appears that all attempts for  arterial evaluation and/or angiography were not follow through with by patient. He does have a history of being seen in lymphedema clinic in 2018, stopped going approximately 6 months ago stating "it didn't do any good". He does not have lymphedema pumps, he does not have custom fit compression wrap/stockings. He is diabetic and his recent A1c last month was 7.6. He admits to chronic bilateral lower extremity pain, no  change in pain since blister and ulceration development. He is currently being treated with Levaquin for bronchitis. He has home health and we will continue. 10/25/17-He is here in follow-up evaluation for bilateral lower extremity ulcerationssubtle he remains on Levaquin for bronchitis. Right lower extremity with no evidence of drainage or ulceration, persistent left lower extremity ulceration. He states that home health has not been out since his appointment. He went to [facility] on Tuesday, studies revealed: RIGHT ABI 0.9, TBI 0.6 LEFT ABI 1.1, TBI 0.6 with triphasic flow bilaterally. We will continue his same treatment plan. He has been educated on compression therapy and need for elevation. He will benefit from lymphedema pumps 11/01/17-He is here in follow-up evaluation for left lower extremity ulcer. The right lower extremity remains healed. He has home health services, but they have not been out to see the patient for 2-3 weeks. He states it home health physical therapy changed his dressing yesterday after therapy; he placed Ace wrap compression. We are still waiting for lymphedema pumps, reordered d/t need for company change. 11/08/17-He is here in follow-up evaluation for left lower extremity ulcer. It is improved. Edema is significantly improved with compression therapy. We will continue with same treatment plan and he will follow-up next week. No word regarding lymphedema pumps 11/15/17-He is here in follow-up evaluation for left lower extremity ulcer. He is healed and will be discharged from wound care services. I have reached out to [facility] regarding his lymphedema pumps. They have been unable to reach the patient; the contact number they had with the patient's wife's cell phone and she has not answered any unrecognized calls. Contact should be made today, trial planned for next week; [facility] will continue to follow 11/27/17 on evaluation today patient has  multiple blistered areas over the right lower extremity his left lower extremity appears to be doing okay. These blistered areas show signs of no infection which is great news. With that being said he did have some necrotic skin overlying which was mechanically debrided away with saline and gauze today without complication. Overall post debridement the wounds appear to be doing better but in general his swelling seems to be increased. This is obviously not good news. I think this is what has given rise to the blisters. 12/04/17 on evaluation today patient presents for follow-up concerning his bilateral lower extremity edema in the right lower extremity ulcers. He has been tolerating the dressing changes without complication. With that being said he has had no real issues with the wraps which is also good news. Overall I'm pleased with the progress he's been making. 12/11/17 on evaluation today patient appears to be doing rather well in regard to his right lateral lower extremity ulcer. He's been tolerating the dressing changes without complication. Fortunately there does not appear to be any evidence of infection at this time. Overall I'm pleased with the progress that is being made. Unfortunately he has been in the hospital due to having what sounds to be a stomach virus/flu fortunately that is starting to get better. 12/18/17 on evaluation  today patient actually appears to be doing very well in regard to his bilateral lower extremities the swelling is under fairly good control his lymphedema pumps are still not up and running quite yet. With that being said he does have several areas of opening noted as far as wounds are concerned mainly over the left lower extremity. With that being said I do believe once he gets lymphedema pumps this would least help you mention some the fluid and preventing this from occurring. Hopefully that will be set up soon sleeves are Artie in place at his home he just waiting  for the machine. 12/25/17 on evaluation today patient actually appears to be doing excellent in fact all of his ulcers appear to have resolved his legs appear very well. I do think he needs compression stockings we have discussed this and they are actually going to go to [facility] today to elastic therapy to get this fitted for him. I think that is definitely a good thing to do. Readmission: 04/09/18 upon evaluation today this patient is seen for readmission due to bilateral lower extremity lymphedema. He has significant swelling of his extremities especially on the left although the right is also swollen he has weeping from both sides. There are no obvious open wounds at this point. Fortunately he has been doing fairly well for quite a bit of time since I last saw him. Nonetheless unfortunately this seems to have reopened and is giving quite a bit of trouble. He states this began about a week ago when he first called [place] to get in to be seen. No fevers, chills, nausea, or vomiting noted at this time. He has not been using his lymphedema pumps due to the fact that they won't fit on his leg at this point likewise is also not been using his compression for essentially the same reason. 04/16/18 upon evaluation today patient actually appears to be doing a little better in regard to the fluid in his bilateral lower extremities. With that being said he's had three falls since I saw him last week. He also states that he's been feeling very poorly. I was concerned last week and feel like that the concern is still there as far as the congestion in his chest is concerned he seems to be breathing about the same as last week but again he states he's very weak he's not even able to walk further than from the chair to the door. His wife had to buy a wheelchair just to be able to get them out of the house to get to the appointment today. This has me very concerned. Adam Merritt, Adam Merritt ([id number]) 04/23/18 on evaluation  today patient actually appears to be doing much better than last week's evaluation. At that time actually had to transport him to the ER via EMS and he subsequently was admitted for acute pulmonary edema, acute renal failure, and acute congestive heart failure. Fortunately he is doing much better. Apparently they did dialyze him and were able to take off roughly 35 pounds of fluid. Nonetheless he is feeling much better both in regard to his breathing and he's able to get around much better at this time compared to previous. Overall I'm very happy with how things are at this time. There does not appear to be any evidence of infection currently. No fevers, chills, nausea, or vomiting noted at this time. 04/30/2018 patient seen today for follow-up and management of bilateral lower extremity lymphedema. He did express being more sad today  than usual due to the recent loss of his dog. He states that he has been compliant with using the lymphedema pumps. However he does admit a minute over the last 2-3 days he has not been using the pumps due to the recent loss of his dog. At this time there is no drainage or open wounds to his lower extremities. The left leg edema is measuring smaller today. Still has a significant amount of edema on bilateral lower extremities With dry flaky skin. He will be referred to the lymphedema clinic for further management. Will continue 3 layer compression wraps and follow-up in 1-2 weeks.Denies any pain, fever, chairs recently. No recent falls or injuries reported during this visit. 05/07/18 on evaluation today patient actually appears to be doing very well in regard to his lower extremities in general all things considered. With that being said he is having some pain in the legs just due to the amount of swelling. He does have an area where he had a blister on the left lateral lower extremity this is open at this point other than that there's nothing else weeping at this  time. 05/14/18 on evaluation today patient actually appears to be doing excellent all things considered in regard to his lower extremities. He still has a couple areas of weeping on each leg which has continued to be the issue for him. He does have an appointment with the lymphedema clinic although this isn't until February 2020. That was the earliest they had. In the meantime he has continued to tolerate the compression wraps without complication. 05/28/18 on evaluation today patient actually appears to be doing more poorly in regard to his left lower extremity where he has a wound open at this point. He also had a fall where he subsequently injured his right great toe which has led to an open wound at the site unfortunately. He has been tolerating the dressing changes without complication in general as far as the wraps are concerned that he has not been putting any dressing on the left 1st toe ulcer site. 06/11/18 on evaluation today patient appears to be doing much worse in regard to his bilateral lower extremity ulcers. He has been tolerating the dressing changes without complication although his legs have not been wrapped more recently. Overall I am not very pleased with the way his legs appear. I do believe he needs to be back in compression wraps he still has not received his compression wraps from the [facility] hospital as of yet. 06/18/18 on evaluation today patient actually appears to be doing significantly better than last time I saw him. He has been tolerating the compression wraps without complication in the circumferential ulcers especially appear to be doing much better. His toe ulcer on the right in regard to the great toe is better although not as good as the legs in my pinion. No fevers chills noted 07/02/18 on evaluation today patient appears to be doing much better in regard to his lower extremity ulcers. Unfortunately since I last saw him he's had the distal portion of his right great toe  if he dated it sounds as if this actually went downhill very quickly. I had only seen him a few days prior and the toe did not appear to be infected at that point subsequently became infected very rapidly and it was decided by the surgeon that the distal portion of the toe needed to be removed. The patient seems to be doing well in this regard he tells  me. With that being said his lower extremities are doing better from the standpoint of the wounds although he is significantly swollen at this point. 07/09/18 on evaluation today patient appears to be doing better in regard to the wounds on his lower extremities. In fact everything is almost completely healed he is just a small area on the left posterior lower extremity that is open at this point. 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 With that being said he needs some kind of compression it does sound like he may not be wearing his compression, that is the wraps, during the entire time between when he's here visit to visit. Apparently his wife took the current one off because it began to "fall apart". 07/16/18 on evaluation today patient appears to be extremely swollen especially in regard to his left lower extremity unfortunately. He also has a new skin tear over the left lower extremity and there's a smaller area on the right lower extremity as well. Unfortunately this seems to be due in part to blistering and fluid buildup in his leg. He did get the reduction wraps that were ordered by the [facility] hospital for him to go to lymphedema clinic. With that being said his wounds on the legs have not healed to the point to where they would likely accept them as a patient lymphedema clinic currently. We need to try to get this to heal. With that being said he's been taking his  wraps off which is not doing him any favors at this point. In fact this is probably quite counterproductive compared to what needs to occur. We will likely need to increase to a four layer compression wrap and continue to also utilize elevation and he has to keep the wraps on not take them off as he's been doing currently hasn't had a wrap on since Saturday. 07/23/18 on evaluation today patient appears to be doing much better in regard to his bilateral lower extremities. In fact his left lower extremity which was the largest is actually 15 cm smaller today compared to what it was last time he was here in our clinic. This is obviously good news after just one week. Nonetheless the differences he actually kept the wraps on during the entire week this time. That's not typical for him. I do believe he understands a little bit better now the severity of the situation and why it's important for him to keep these wraps on. 07/30/18 on evaluation today patient actually appears to be doing rather well in regard to his lower extremities. His legs are much smaller than they have been in the past and he actually has only one very small rudder superficial region remaining that is not closed on the left lateral/posterior lower extremity even this is almost completely close. I do believe likely next week he will be healed without any complications. I do think we need to continue the wraps however this seems to be beneficial for him. I also think it may be a good time for [place] to go ahead and see about getting the appointment with the lymphedema clinic which is supposed to be made for him in order to keep this moving along and hopefully get them into compression wraps that will in the end help him to remain healed. 08/06/18 on evaluation today patient actually  appears to be doing very well in regard as bilateral lower extremities. In fact his wounds appear to be completely healed at this time. He does have bilateral  lymphedema which has been extremely well controlled with the compression wraps. He is in the process of getting appointment with the lymphedema clinic we have made this referral were just waiting to hear back on the schedule time. We need to follow up on that today as well. 08/13/18 on evaluation today patient actually appears to be doing very well in regard to his bilateral lower extremities there are no open wounds at this point. We are gonna go ahead and see about ordering the Velcro compression wraps for him had a discussion with them about [place] doing it versus the [place] they feel like they can definitely afford going ahead and get the wraps themselves and they would prefer to try to avoid having to go to the lymphedema clinic if it all possible which I completely understand. As long as he has good compression I'm okay either way. Adam Merritt, Adam Merritt ([id number]) 3/17//20 on evaluation today patient actually appears to be doing well in regard to his bilateral lower extremity ulcers. He has been tolerating the dressing changes without complication specifically the compression wraps. Overall is had no issues my fingers finding that I see at this point is that he is having trouble with constipation. He tells me he has not been able to go to the bathroom for about six days. He's taken to over-the- counter oral laxatives unfortunately this is not helping. He has not contacted his doctor. 08/27/18 on evaluation today patient appears to be doing fairly well in regard to his lower extremities at this point. There does not appear to be any new altars is swelling is very well controlled. We are still waiting for his Velcro compression wraps to arrive that should be sometime in the next week is Artie sent the check for these. 09/03/18 on evaluation today patient appears to be doing excellent in regard to his bilateral lower extremities which he shows no signs of wound openings in regard to this point. He does have his  Velcro compression wraps which did arrive in the mail since I saw him last week. Overall he is doing excellent in my pinion. 09/13/18 on evaluation today patient appears to be doing very well currently in regard to the overall appearance of his bilateral lower extremities although he's a little bit more swollen than last time we saw him. At that point he been discharged without any open wounds. Nonetheless he has a small open wound on the posterior left lower extremity with some evidence of cellulitis noted as well. Fortunately I feel like he has made good progress overall with regard to his lower extremities from were things used to be. 09/20/18 on evaluation today patient actually appears to be doing much better. The erythematous lower extremity is improving wound itself which is still open appears to be doing much better as far as but appearance as well as pain is concerned overall very pleased in this regard. There's no signs of active infection at this time. 09/27/18 on evaluation today patient's wounds on the lower extremity actually appear to be doing fairly well at this time which is good news. There is no evidence of active infection currently and again is just as left lower extremity were there any wounds at this point anyway. I believe they may be completely healed but again I'm not 100% sure based on evaluation  today. I think one more week of observation would likely be a good idea. 10/04/18 on evaluation today patient actually appears to be doing excellent in regard to the left lower extremity which actually appears to be completely healed as of today. Unfortunately he's been having issues with his right lower extremity have a new wound that has opened. Fortunately there's no evidence of active infection at this time which is good news. 10/10/18 upon evaluation today patient actually appears to be doing a little bit worse with the new open area on his right posterior lower extremity. He's been  tolerating the dressing changes without complication. Right now we been using his compression wraps although I think we may need to switch back to actually performing bilateral compression wraps are in the clinic. No fevers, chills, nausea, or vomiting noted at this time. 10/17/18 on evaluation today patient actually appears to be doing quite well in regard to his bilateral lower extremity ulcers. He has been tolerating the reps without complication although he would prefer not to rewrap his legs as of today. Fortunately there's no signs of active infection which is good news. No fevers, chills, nausea, or vomiting noted at this time. 10/24/18 on evaluation today patient appears to be doing very well in regard to his lower extremities. His right lower extremity is shown signs of healing and his left lower Trinity though not healed appears to be improving which is excellent news. Overall very pleased with how things seem to be progressing at this time. The patient is likewise happy to hear this. His Velcro compression wraps however have not been put on properly will gonna show his wife how to do that properly today. 10/31/18 on evaluation today patient appears to be doing more poorly in regard to his left lower extremity in particular although both lower extremities actually are showing some signs of being worse than my previous evaluation. Unfortunately I'm just not sure that his compression stockings even with the use of the compression/lymphedema pumps seem to be controlling this well. Upon further questioning he tells me that he also is not able to lie flat in the bed due to his congestive heart failure and difficulty breathing. For that reason he sleeps in his recliner. To make matters worse his recliner also cannot even hold his legs up so instead of even being somewhat elevated they pretty much hang down to the floor. This is the way he sleeps each night which is definitely counterproductive to  everything else that were attempting to do from the standpoint of controlling his fluid. Nonetheless I think that he potentially could benefit from a hospital bed although this would be something that his primary care provider would likely have to order since anything that is order on our side has to be directly related to wound care and again the hospital bed is not necessarily a direct relation to although I think it does contribute to his overall wound status on his lower extremities. 11/07/18 on evaluation today patient appears to be doing better in regard to his bilateral lower extremity. He's been tolerating the dressing changes without complication. We did in the interim since I last saw him switch to just using extras orbiting new alginate and that seems to have done much better for him. I'm very pleased with the overall progress that is made. 11/14/18 on evaluation today patient appears to be redoing rather well in regard to his left lower extremity ulcers which are the only ones remaining at this  point. Fortunately there's no signs of active infection at this time which is good news. Overall been very pleased with how things seem to be progressing currently. No fevers, chills, nausea, or vomiting noted at this time. 11/20/17 on evaluation today patient actually appears to be doing quite well in regard to his lower extremities on the left on the right he has several blisters that showed up although there's some question about whether or not he has had his broker compression wraps on like he was supposed to or not. Fortunately there's no signs of active infection at this time which is good news. Unfortunately though he is doing well from the standpoint of the left leg the right leg is not doing as well again this is when we did not wrap last week. 11/28/18 on evaluation today patient appears to be doing well in regard to his bilateral lower extremities is left appears to be healed is right is  not healed but is very close to being so. Overall very pleased with how things seem to be progressing. Patient is likewise happy that things are doing well. 12/09/18 on evaluation today patient actually appears to be completely healed which is excellent news. He actually seems to be doing well in regard to the swelling of the bilateral lower extremities which is also great news. Overall very pleased with how things seem to be progressing. Readmission: 03/18/2019 upon evaluation today patient presents for reevaluation concerning issues that he is having with his left lower extremity where he does have a wound noted upon inspection today. He also has a wound on the right second toe on the tip where it is apparently been rubbing in his shoe I did look at issues and you can actually see where this has been occurring as well. Fortunately there is no signs of infection with regard to Adam Merritt, Adam Merritt. ([id number]) the toe necessarily although it is very swollen compared to normal that does have me somewhat concerned about the possibility of further evaluation for infection/osteomyelitis. I would recommend an x-ray to start with. Otherwise he states that it is been 1-2 weeks that he has had the draining in regard to left lower extremity he does not know how this happened he has been wearing his compression appropriately and I do see that as well today but nonetheless I do think that he needs to continue to be very cautious with regard to elevation as well. 03/25/2019 on evaluation today patient appears to be doing much better in regard to his lower extremity at this time. That is on the left. He did culture positive for Pseudomonas but the good news is he seems to be doing much better the gentamicin we applied topically seems to have done a great job for him. There is no signs of active infection at this time systemically and locally he is doing much better. No fevers, chills, nausea, vomiting, or diarrhea. In  regard to his right toe this seems to be doing much better there is very little pressure noted at this point I did clean up some of the callus today around the edges of the wound as well as the surface of the wound but to be honest he is progressing quite nicely. 10/30; the area on the left anterior lower tibia area is healed over. His edema control is adequate even though his use of the compression pumps seems very intermittent. He has a juxta lite stocking on the right leg and a think there is 1 for  the left leg at home. His wife is putting these on. He has an area on the plantar right second toe which is a hammertoe they are trying to offload this 04/08/2019 on evaluation today patient appears to be doing well in regard to his lower extremities which is good news. We are seeing him today for his toe ulcer which is giving him a little bit more trouble but again still seems to be doing better at this point which is good news. He is going require some sharp debridement in regard to the toe today however. 04/15/2019 on evaluation today patient actually appears to be doing quite well with regard to his toe ulcer. I am very pleased with how things are doing in that regard. With regard to his lower extremity edema in general he tells me he is not been using the lymphedema pumps regularly although he does not use them. I think that he needs to use them more regularly he does have a small blister on the left lower extremity this is not open at this point but obviously it something that can get worse if he does not keep the compression going and the pumps going as well. 04/22/2019 on evaluation today patient appears to be doing well with regard to his toe ulcer. He has been tolerating the dressing changes without complication. This is measuring slightly smaller this week as compared to last week. Fortunately there is no signs of active infection at this time. 05/06/2019 on evaluation today patient actually  appears to be doing excellent. In fact his toe appears to be completely healed which is great news. There is no signs of active infection at this time. Readmission: Patient presents today for follow-up after having been discharged at the beginning of December 2020. He states that in recent weeks things have reopened and has been having more trouble at this point. With that being said fortunately there is no signs of active infection at this time. No fever chills noted. Nonetheless he also does have an issue with one of his toes as well that seems to be somewhat this is on the right foot second toe. 07/25/2019 upon evaluation today patient's lower extremities appear to be doing excellent bilaterally. I really feel like he is showing signs of great improvement and in fact I think he is close to healing which is great news. There is no signs of active infection at this time. No fevers, chills, nausea, vomiting, or diarrhea. 07/31/2019 upon evaluation today patient appears to be doing excellent and in fact I think may be completely healed in regard to his right lower extremity that were still to monitor this 1 more week before closing it out. The left lower extremity is measuring smaller although not completely closed seems to be doing excellent. 08/07/2019 upon evaluation today patient appears to be doing excellent in regard to his wounds. In fact the right lower extremity is completely healed there is no issues here. The left lower extremity has 1 very small area still remaining fortunately there is no signs of infection and overall I think he is very close to closure of the here as well. 08/14/19 upon evaluation today patient appears to be doing excellent in regard to his lower extremities. In fact he appears to be completely healed as of today. Fortunately there's no signs of active infection at this time. No fevers, chills, nausea, or vomiting noted at this time. READMISSION 09/26/2019 This is a  74 year old man who is well-known to this  clinic having just been discharged on 08/14/2019. He has known lymphedema and chronic venous insufficiency. He has Farrow wrap stockings and also external compression pumps. He does not use the external compression pumps however. He does however apparently fairly faithfully use the stockings. They have not been lotion in his legs. He has developed new wounds on the right anterior as well as the left medial left lateral and left posterior calf. He returns for our review of this Past medical history includes coronary artery disease, hypertension, congestive heart failure, COPD, venous insufficiency with lymphedema, type 2 diabetes. He apparently has compression pumps, Farrow wraps and at 1 point a hospital bed although I believe he sleeps in a recliner 10/03/2019 upon evaluation today patient appears to be doing better with regard to his wounds in general. He has been tolerating the dressing changes without complication. Fortunately there is no signs of active infection. No fevers, chills, nausea, vomiting, or diarrhea. I do believe the compression wraps are helping as they always have in the past. Objective Constitutional Adam Merritt, Adam Merritt. ([id number]) Obese and well-hydrated in no acute distress. Vitals Time Taken: 2:45 PM, Temperature: 98.5 F, Pulse: 79 bpm, Respiratory Rate: 20 breaths/min, Blood Pressure: 166/72 mmHg. Respiratory normal breathing without difficulty. Psychiatric this patient is able to make decisions and demonstrates good insight into disease process. Alert and Oriented x 3. pleasant and cooperative. General Notes: Upon inspection patient's wounds again did not require sharp debridement other than just mechanical debridement with saline and gauze which I did perform today. He tolerated that with no pain and things appear to be doing much better I am very pleased in this regard. Integumentary (Hair, Skin) Wound #31 status is Open. Original  cause of wound was Blister. The wound is located on the Right,Anterior Lower Leg. The wound measures 2cm length x 5cm width x 0.1cm depth; 7.854cm^2 area and 0.785cm^3 volume. There is Fat Layer (Subcutaneous Tissue) Exposed exposed. There is a medium amount of serosanguineous drainage noted. There is medium (34-66%) red granulation within the wound bed. There is a medium (34-66%) amount of necrotic tissue within the wound bed including Adherent Slough. Wound #32 status is Open. Original cause of wound was Blister. The wound is located on the Left,Lateral Lower Leg. The wound measures 6.5cm length x 8cm width x 0.1cm depth; 40.841cm^2 area and 4.084cm^3 volume. There is Fat Layer (Subcutaneous Tissue) Exposed exposed. There is a medium amount of serosanguineous drainage noted. There is medium (34-66%) red granulation within the wound bed. There is a medium (34-66%) amount of necrotic tissue within the wound bed including Adherent Slough. Wound #33 status is Open. Original cause of wound was Blister. The wound is located on the Left,Medial Lower Leg. The wound measures 6cm length x 3cm width x 0.1cm depth; 14.137cm^2 area and 1.414cm^3 volume. There is Fat Layer (Subcutaneous Tissue) Exposed exposed. There is a medium amount of serosanguineous drainage noted. The wound margin is flat and intact. There is medium (34-66%) red granulation within the wound bed. There is a medium (34-66%) amount of necrotic tissue within the wound bed including Adherent Slough. Wound #34 status is Open. Original cause of wound was Blister. The wound is located on the Left,Posterior Lower Leg. The wound measures 0cm length x 0cm width x 0cm depth; 0cm^2 area and 0cm^3 volume. There is Fat Layer (Subcutaneous Tissue) Exposed exposed. There is a medium amount of serosanguineous drainage noted. There is medium (34-66%) red granulation within the wound bed. There is a medium (34- 66%)  amount of necrotic tissue within the wound  bed including Adherent Slough. 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 There was a Chemical/Enzymatic/Mechanical debridement performed by STONE III, Jackie Littlejohn E., PA-C. With the following instrument(s): gauze after achieving pain control using Lidocaine 4% Topical Solution. Other agent used was normal saline. A time out was conducted at 15:30, prior to the start of the procedure. There was no bleeding. The procedure was tolerated well with a pain level of 0 throughout and a pain level of 0 following the procedure. Post Debridement Measurements: 2cm length x 5cm width x 0.1cm depth; 0.785cm^3 volume. Character of Wound/Ulcer Post Debridement is improved. Severity of Tissue Post Debridement is: Fat layer exposed. 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 There was a Four Layer Compression Therapy Procedure by Montey Hora, RN. 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 There was a Chemical/Enzymatic/Mechanical debridement performed by STONE III, Cassadee Vanzandt E., PA-C. With the following instrument(s): gauze after achieving pain control using Lidocaine 4% Topical Solution. Other agent used was normal saline. A time out was conducted at 15:31, prior to the start of the procedure. There  was no bleeding. The procedure was tolerated well with a pain level of 0 throughout and a pain level of 0 following the procedure. Post Debridement Measurements: 6.5cm length x 8cm width x 0.1cm depth; 4.084cm^3 Adam Merritt, Adam Merritt. ([id number]) volume. Character of Wound/Ulcer Post Debridement is improved. Severity of Tissue Post Debridement is: Fat layer exposed. 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 There was a Four Layer Compression Therapy Procedure by Montey Hora, RN. 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 There was a Four Layer Compression Therapy Procedure by Montey Hora, RN. Post procedure Diagnosis Wound #33: Same as Pre-Procedure Plan Wound Cleansing: Wound #31 Right,Anterior Lower Leg: Clean wound with Normal Saline. May shower with protection. - Please do not get your wraps wet Wound #32 Left,Lateral Lower Leg: Clean wound with Normal Saline. May shower with protection. - Please do not get your wraps wet Wound #33 Left,Medial Lower Leg: Clean wound with Normal Saline. May shower with protection. - Please do not get your wraps wet Skin Barriers/Peri-Wound Care: Moisturizing lotion - to both lower legs Primary Wound Dressing: Wound #31 Right,Anterior Lower Leg: Silver Alginate Wound #32 Left,Lateral Lower Leg: Silver Alginate Wound #33 Left,Medial Lower Leg: Silver Alginate Secondary Dressing: Wound #31 Right,Anterior Lower Leg: ABD pad - carboflex Wound #32 Left,Lateral Lower Leg: ABD pad - carboflex Wound #33 Left,Medial Lower Leg: ABD pad - carboflex Dressing Change Frequency: Change dressing every week - Please call for nurse visit if your wraps start to slide down  your legs Follow-up Appointments: Return Appointment in 1 week. Nurse Visit as needed - Please call for nurse visit if your wraps start to slide down your legs Edema Control: Wound #31 Right,Anterior Lower Leg: 4 Layer Compression System - Bilateral Elevate legs to the level of the heart and pump ankles as often as possible Compression Pump: Use compression pump on left lower extremity for 60 minutes, twice daily. Compression Pump: Use compression pump on right lower extremity for 60 minutes, twice daily. Wound #32 Left,Lateral Lower Leg: 4 Layer Compression System - Bilateral Elevate legs to the level of the heart and pump ankles as often as possible Compression Pump: Use compression pump on left lower extremity for 60 minutes, twice daily. Compression Pump: Use compression pump on right lower extremity for 60 minutes, twice daily. Wound #33 Left,Medial Lower Leg: 4 Layer Compression System - Bilateral Elevate legs to the level of the heart and pump ankles as often as possible Compression Pump: Use compression pump on left lower extremity for 60 minutes, twice daily. Compression Pump: Use compression pump on right lower extremity for 60 minutes, twice daily. 1. I would recommend currently that we go ahead and initiate a continuation of treatment with the silver alginate dressing followed by CarboFlex to help with the odor due to the drainage. 2. We will also continue with the 4 layer compression wrap bilaterally which seems to be doing well for him. 3. Were also can order him a new set of [place] wraps that have done very well for him in the past. Adam Merritt, Adam Merritt ([id number]) We will see patient back for reevaluation in 1 week here in the clinic. If anything worsens or changes patient will contact our office for additional recommendations. Electronic Signature(s) Signed: 10/03/2019 4:23:51 PM By: Worthy Keeler PA-C Entered By: Worthy Keeler on 10/03/2019 16:23:51 Adam Merritt  ([id number]) -------------------------------------------------------------------------------- SuperBill Details Patient Name: Adam Merritt Date of Service: 10/03/2019 Medical Record Number: [id number] Patient Account Number: [id number] Date of Birth/Sex: 10-06-1945 (74 y.o. 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

## 2019-10-03 NOTE — Progress Notes (Signed)
Adam Merritt, Adam Merritt ([id number]) Visit Report for 10/03/2019 Arrival Information Details Patient Name: Adam Merritt, Adam Merritt Date of Service: 10/03/2019 3:00 PM Medical Record Number: [id number] Patient Account Number: [id number] Date of Birth/Sex: 10/29/1945 (74 y.o. M) Treating RN: Montey Hora Primary Care Tanisha Lutes: [name] Other Clinician: Referring Jameyah Fennewald: [name] Treating Desteny Freeman/Extender: Melburn Hake, [name]: 1 Visit Information History Since Last Visit Added or deleted any medications: No Patient Arrived: Cane Any new allergies or adverse reactions: No Arrival Time: 14:43 Had a fall or experienced change in No Accompanied By: wife activities of daily living that may affect Transfer Assistance: None risk of falls: Patient Identification Verified: Yes Signs or symptoms of abuse/neglect since last visito No Secondary Verification Process Completed: Yes Hospitalized since last visit: No Patient Has Alerts: Yes Implantable device outside of the clinic excluding No Patient Alerts: Patient on Blood Thinner cellular tissue based products placed in the center DMII since last visit: Aspirin 325 Has Dressing in Place as Prescribed: Yes Has Compression in Place as Prescribed: Yes Pain Present Now: No Electronic Signature(s) Signed: 10/03/2019 4:14:40 PM By: Lorine Bears RCP, RRT, CHT Entered By: Lorine Bears on 10/03/2019 14:44:56 Adam Merritt ([id number]) -------------------------------------------------------------------------------- Compression Therapy Details Patient Name: Adam Merritt Date of Service: 10/03/2019 3:00 PM Medical Record Number: [id number] Patient Account Number: [id number] Date of Birth/Sex: [birth year] (74 y.o. M) Treating RN: Montey Hora Primary Care Loney Domingo: [name] Other Clinician: Referring Cree Napoli: [name] Treating Eashan Schipani/Extender: Melburn Hake, [name]: 1 Compression  Therapy Performed for Wound Assessment: Wound #31 Right,Anterior Lower Leg Performed By: Clinician Montey Hora, RN Compression Type: Four Layer Post Procedure Diagnosis Same as Pre-procedure Electronic Signature(s) Signed: 10/03/2019 4:19:26 PM By: Montey Hora Entered By: Montey Hora on 10/03/2019 15:34:22 Adam Merritt ([id number]) -------------------------------------------------------------------------------- Compression Therapy Details Patient Name: Adam Merritt Date of Service: 10/03/2019 3:00 PM Medical Record Number: [id number] Patient Account Number: [id number] Date of Birth/Sex: [birth year] (74 y.o. M) Treating RN: Montey Hora Primary Care Eula Mazzola: [name] Other Clinician: Referring Dinah Lupa: [name] Treating Marti Mclane/Extender: Melburn Hake, [name]: 1 Compression Therapy Performed for Wound Assessment: Wound #32 Left,Lateral Lower Leg Performed By: Clinician Montey Hora, RN Compression Type: Four Layer Post Procedure Diagnosis Same as Pre-procedure Electronic Signature(s) Signed: 10/03/2019 4:19:26 PM By: Montey Hora Entered By: Montey Hora on 10/03/2019 15:34:22 Adam Merritt ([id number]) -------------------------------------------------------------------------------- Compression Therapy Details Patient Name: Adam Merritt Date of Service: 10/03/2019 3:00 PM Medical Record Number: [id number] Patient Account Number: [id number] Date of Birth/Sex: 03/23/1946 (74 y.o. M) Treating RN: Montey Hora Primary Care Sharia Averitt: [name] Other Clinician: Referring Laveta Gilkey: [name] Treating Reagen Haberman/Extender: Melburn Hake, [name]: 1 Compression Therapy Performed for Wound Assessment: Wound #33 Left,Medial Lower Leg Performed By: Clinician Montey Hora, RN Compression Type: Four Layer Post Procedure Diagnosis Same as Pre-procedure Electronic Signature(s) Signed: 10/03/2019 4:19:26 PM By: Montey Hora Entered  By: Montey Hora on 10/03/2019 15:34:22 Adam Merritt ([id number]) -------------------------------------------------------------------------------- Encounter Discharge Information Details Patient Name: Adam Merritt Date of Service: 10/03/2019 3:00 PM Medical Record Number: [id number] Patient Account Number: [id number] Date of Birth/Sex: November 12, 1945 (74 y.o. M) Treating RN: Montey Hora Primary Care Jenniger Figiel: [name] Other Clinician: Referring Johnny Latu: [name] Treating Mikaya Bunner/Extender: Melburn Hake, [name]: 1 Encounter Discharge Information Items Discharge Condition: Stable Ambulatory Status: Cane Discharge Destination: Home Transportation: Private Auto Accompanied By: wife Schedule Follow-up Appointment: Yes Clinical Summary of Care: Electronic Signature(s) Signed: 10/03/2019 4:19:26  PM By: Montey Hora Entered By: Montey Hora on 10/03/2019 15:37:52 Adam Merritt ([id number]) -------------------------------------------------------------------------------- Lower Extremity Assessment Details Patient Name: Adam Merritt Date of Service: 10/03/2019 3:00 PM Medical Record Number: [id number] Patient Account Number: [id number] Date of Birth/Sex: [birth year] (74 y.o. M) Treating RN: Montey Hora Primary Care Breyon Blass: [name] Other Clinician: Referring Harlan Ervine: [name] Treating Ford Peddie/Extender: STONE III, [name]: 1 Edema Assessment Assessed: [Left: No] [Right: No] Edema: [Left: Yes] [Right: Yes] Calf Left: Right: Point of Measurement: 31 cm From Medial Instep 53 cm 43.5 cm Ankle Left: Right: Point of Measurement: 11 cm From Medial Instep 33 cm 28.5 cm Vascular Assessment Pulses: Dorsalis Pedis Palpable: [Left:Yes] [Right:Yes] Electronic Signature(s) Signed: 10/03/2019 4:19:26 PM By: Montey Hora Entered By: Montey Hora on 10/03/2019 14:58:49 Adam Merritt  ([id number]) -------------------------------------------------------------------------------- Multi Wound Chart Details Patient Name: Adam Merritt Date of Service: 10/03/2019 3:00 PM Medical Record Number: [id number] Patient Account Number: [id number] Date of Birth/Sex: [birth year] (74 y.o. 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 Extremity Extremity Comorbid History: Anemia, Lymphedema, Chronic Anemia, Lymphedema, Chronic Anemia, Lymphedema, Chronic Obstructive Pulmonary Disease Obstructive Pulmonary Disease Obstructive Pulmonary Disease (COPD), Sleep Apnea, Congestive (COPD), Sleep Apnea, Congestive (COPD), Sleep Apnea, Congestive Heart Failure, Coronary Artery Heart Failure, Coronary Artery Heart Failure, Coronary Artery Disease, Hypertension, Peripheral Disease, Hypertension, Peripheral Disease, Hypertension, Peripheral Venous Disease, Type II Diabetes, Venous Disease, Type II Diabetes, Venous Disease, Type II Diabetes, Osteoarthritis, Neuropathy, Osteoarthritis, Neuropathy, Osteoarthritis, Neuropathy, Received Chemotherapy Received Chemotherapy Received Chemotherapy Date Acquired: 09/18/2019 09/18/2019 09/18/2019 Weeks of Treatment: 1 1 1  Wound Status: Open Open Open Measurements L x W x D (cm) 2x5x0.1 6.5x8x0.1 6x3x0.1 Area (cm) : 7.854 40.841 14.137 Volume (cm) : 0.785 4.084 1.414 % Reduction in Area: 28.60% 20.00%  -414.30% % Reduction in Volume: 28.60% 20.00% -414.20% Classification: Grade 2 Grade 2 Grade 2 Exudate Amount: Medium Medium Medium Exudate Type: Serosanguineous Serosanguineous Serosanguineous Exudate Color: red, brown red, brown red, brown Wound Margin: N/A N/A Flat and Intact Granulation Amount: Medium (34-66%) Medium (34-66%) Medium (34-66%) Granulation Quality: Red Red Red Necrotic Amount: Medium (34-66%) Medium (34-66%) Medium (34-66%) Exposed Structures: Fat Layer (Subcutaneous Tissue) Fat Layer (Subcutaneous Tissue) Fat Layer (Subcutaneous Tissue) Exposed: Yes Exposed: Yes Exposed: Yes Fascia: No Fascia: No Fascia: No Tendon: No Tendon: No Tendon: No Muscle: No Muscle: No Muscle: No Joint: No Joint: No Joint: No Bone: No Bone: No Bone: No Epithelialization: None None None Wound Number: 26 N/A N/A Photos: N/A N/A Adam Merritt, Adam Merritt ([id number]) Wound Location: Left, Posterior Lower Leg N/A N/A Wounding Event: Blister N/A N/A Primary Etiology: Diabetic Wound/Ulcer of the Lower N/A N/A Extremity Comorbid History: Anemia, Lymphedema, Chronic N/A N/A Obstructive Pulmonary Disease (COPD), Sleep Apnea, Congestive Heart Failure, Coronary Artery Disease, Hypertension, Peripheral Venous Disease, Type II Diabetes, Osteoarthritis, Neuropathy, Received Chemotherapy Date Acquired: 09/18/2019 N/A N/A Weeks of Treatment: 1 N/A N/A Wound Status: Open N/A N/A Measurements L x W x D (cm) 0x0x0 N/A N/A Area (cm) : 0 N/A N/A Volume (cm) : 0 N/A N/A % Reduction in Area:  100.00% N/A N/A % Reduction in Volume: 100.00% N/A N/A Classification: Grade 2 N/A N/A Exudate Amount: Medium N/A N/A Exudate Type: Serosanguineous N/A N/A Exudate Color: red, brown N/A N/A Wound Margin: N/A N/A N/A Granulation Amount: Medium (34-66%) N/A N/A Granulation Quality: Red N/A N/A Necrotic Amount: Medium (34-66%) N/A N/A Exposed Structures: Fat Layer (Subcutaneous Tissue) N/A N/A Exposed:  Yes Fascia: No Tendon: No Muscle: No Joint: No Bone: No Epithelialization: None N/A N/A Treatment Notes Electronic Signature(s) Signed: 10/03/2019 4:19:26 PM By: Montey Hora Entered By: Montey Hora on 10/03/2019 15:33:03 Adam Merritt ([id number]) -------------------------------------------------------------------------------- [place] Details Patient Name: Adam Merritt Date of Service: 10/03/2019 3:00 PM Medical Record Number: [id number] Patient Account Number: [id number] Date of Birth/Sex: July 09, 1945 (74 y.o. M) Treating RN: Montey Hora Primary Care Nakeitha Milligan: [name] Other Clinician: Referring Adalai Perl: [name] Treating Lavern Crimi/Extender: Melburn Hake, [name]: 1 Active Inactive Abuse / Safety / Falls / Self Care Management Nursing Diagnoses: Potential for falls Goals: Patient will remain injury free related to falls Date Initiated: 09/26/2019 Target Resolution Date: 12/13/2019 Goal Status: Active Interventions: Assess fall risk on admission and as needed Notes: Venous Leg Ulcer Nursing Diagnoses: Actual venous Insuffiency (use after diagnosis is confirmed) Goals: Patient will maintain optimal edema control Date Initiated: 09/26/2019 Target Resolution Date: 12/13/2019 Goal Status: Active Interventions: Compression as ordered Notes: Wound/Skin Impairment Nursing Diagnoses: Impaired tissue integrity Goals: Ulcer/skin breakdown will heal within 14 weeks Date Initiated: 09/26/2019 Target Resolution Date: 12/13/2019 Goal Status: Active Interventions: Assess patient/caregiver ability to obtain necessary supplies Assess patient/caregiver ability to perform ulcer/skin care regimen upon admission and as needed Assess ulceration(s) every visit Notes: Electronic Signature(s) Signed: 10/03/2019 4:19:26 PM By: Montey Hora Entered By: Montey Hora on 10/03/2019 15:32:51 Adam Merritt  ([id number]) -------------------------------------------------------------------------------- Pain Assessment Details Patient Name: Adam Merritt Date of Service: 10/03/2019 3:00 PM Medical Record Number: [id number] Patient Account Number: [id number] Date of Birth/Sex: [birth year] (74 y.o. M) Treating RN: Montey Hora Primary Care Demaryius Imran: [name] Other Clinician: Referring Francenia Chimenti: [name] Treating Megham Dwyer/Extender: Melburn Hake, [name]: 1 Active Problems Location of Pain Severity and Description of Pain Patient Has Paino Yes Site Locations Pain Location: Pain in Ulcers With Dressing Change: Yes Duration of the Pain. Constant / Intermittento Intermittent Pain Management and Medication Current Pain Management: Electronic Signature(s) Signed: 10/03/2019 4:19:26 PM By: Montey Hora Entered By: Montey Hora on 10/03/2019 14:56:56 Adam Merritt ([id number]) -------------------------------------------------------------------------------- Patient/Caregiver Education Details Patient Name: Adam Merritt Date of Service: 10/03/2019 3:00 PM Medical Record Number: [id number] Patient Account Number: [id number] Date of Birth/Gender: November 24, 1945 (74 y.o. M) Treating RN: Montey Hora Primary Care Physician: [name] Other Clinician: Referring Physician: [name] Treating Physician/Extender: Sharalyn Ink in Treatment: 1 Education Assessment Education Provided To: Patient and Caregiver Education Topics Provided Venous: Handouts: Other: use pumps Methods: Explain/Verbal Responses: State content correctly Electronic Signature(s) Signed: 10/03/2019 4:19:26 PM By: Montey Hora Entered By: Montey Hora on 10/03/2019 15:37:02 Adam Merritt ([id number]) -------------------------------------------------------------------------------- Wound Assessment Details Patient Name: Adam Merritt Date of Service: 10/03/2019 3:00 PM Medical Record  Number: [id number] Patient Account Number: [id number] Date of Birth/Sex: 1946/03/12 (74 y.o. M) Treating RN: Montey Hora Primary Care Naisha Wisdom: [name] Other Clinician: Referring Kendallyn Lippold: [name] Treating Aella Ronda/Extender: STONE III, [name]: 1 Wound Status Wound Number: 31 Primary Diabetic Wound/Ulcer of the Lower Extremity Etiology: Wound Location: Right, Anterior Lower Leg Wound Open Wounding Event: Blister Status: Date Acquired: 09/18/2019  Comorbid Anemia, Lymphedema, Chronic Obstructive Pulmonary Weeks Of Treatment: 1 History: Disease (COPD), Sleep Apnea, Congestive Heart Failure, Clustered Wound: No Coronary Artery Disease, Hypertension, Peripheral Venous Disease, Type II Diabetes, Osteoarthritis, Neuropathy, Received Chemotherapy Photos Wound Measurements Length: (cm) 2 Width: (cm) 5 Depth: (cm) 0.1 Area: (cm) 7.854 Volume: (cm) 0.785 % Reduction in Area: 28.6% % Reduction in Volume: 28.6% Epithelialization: None Wound Description Classification: Grade 2 F Exudate Amount: Medium S Exudate Type: Serosanguineous Exudate Color: red, brown oul Odor After Cleansing: No lough/Fibrino Yes Wound Bed Granulation Amount: Medium (34-66%) Exposed Structure Granulation Quality: Red Fascia Exposed: No Necrotic Amount: Medium (34-66%) Fat Layer (Subcutaneous Tissue) Exposed: Yes Necrotic Quality: Adherent Slough Tendon Exposed: No Muscle Exposed: No Joint Exposed: No Bone Exposed: No Treatment Notes Wound #31 (Right, Anterior Lower Leg) Notes silvercel, xsorb, 4 layer bilateral Adam Merritt, Adam Merritt ([id number]) Electronic Signature(s) Signed: 10/03/2019 3:07:33 PM By: Army Melia Signed: 10/03/2019 4:19:26 PM By: Montey Hora Entered By: Army Melia on 10/03/2019 15:07:32 Adam Merritt ([id number]) -------------------------------------------------------------------------------- Wound Assessment Details Patient Name: Adam Merritt Date  of Service: 10/03/2019 3:00 PM Medical Record Number: [id number] Patient Account Number: [id number] Date of Birth/Sex: 07/04/1945 (74 y.o. M) Treating RN: Montey Hora Primary Care Devone Bonilla: [name] Other Clinician: Referring Mataeo Ingwersen: [name] Treating Benyamin Jeff/Extender: STONE III, [name]: 1 Wound Status Wound Number: 32 Primary Diabetic Wound/Ulcer of the Lower Extremity Etiology: Wound Location: Left, Lateral Lower Leg Wound Open Wounding Event: Blister Status: Date Acquired: 09/18/2019 Comorbid Anemia, Lymphedema, Chronic Obstructive Pulmonary Weeks Of Treatment: 1 History: Disease (COPD), Sleep Apnea, Congestive Heart Failure, Clustered Wound: No Coronary Artery Disease, Hypertension, Peripheral Venous Disease, Type II Diabetes, Osteoarthritis, Neuropathy, Received Chemotherapy Photos Wound Measurements Length: (cm) 6.5 % Width: (cm) 8 % Depth: (cm) 0.1 Ep Area: (cm) 40.841 Volume: (cm) 4.084 Reduction in Area: 20% Reduction in Volume: 20% ithelialization: None Wound Description Classification: Grade 2 Fo Exudate Amount: Medium Sl Exudate Type: Serosanguineous Exudate Color: red, brown ul Odor After Cleansing: No ough/Fibrino Yes Wound Bed Granulation Amount: Medium (34-66%) Exposed Structure Granulation Quality: Red Fascia Exposed: No Necrotic Amount: Medium (34-66%) Fat Layer (Subcutaneous Tissue) Exposed: Yes Necrotic Quality: Adherent Slough Tendon Exposed: No Muscle Exposed: No Joint Exposed: No Bone Exposed: No Treatment Notes Wound #32 (Left, Lateral Lower Leg) Notes silvercel, xsorb, 4 layer bilateral Adam Merritt, Adam Merritt ([id number]) Electronic Signature(s) Signed: 10/03/2019 3:07:57 PM By: Army Melia Signed: 10/03/2019 4:19:26 PM By: Montey Hora Entered By: Army Melia on 10/03/2019 15:07:56 Adam Merritt ([id number]) -------------------------------------------------------------------------------- Wound Assessment  Details Patient Name: Adam Merritt Date of Service: 10/03/2019 3:00 PM Medical Record Number: [id number] Patient Account Number: [id number] Date of Birth/Sex: [birth year] (74 y.o. M) Treating RN: Montey Hora Primary Care Raef Sprigg: [name] Other Clinician: Referring Zania Kalisz: [name] Treating Ovetta Bazzano/Extender: STONE III, [name]: 1 Wound Status Wound Number: 33 Primary Diabetic Wound/Ulcer of the Lower Extremity Etiology: Wound Location: Left, Medial Lower Leg Wound Open Wounding Event: Blister Status: Date Acquired: 09/18/2019 Comorbid Anemia, Lymphedema, Chronic Obstructive Pulmonary Weeks Of Treatment: 1 History: Disease (COPD), Sleep Apnea, Congestive Heart Failure, Clustered Wound: No Coronary Artery Disease, Hypertension, Peripheral Venous Disease, Type II Diabetes, Osteoarthritis, Neuropathy, Received Chemotherapy Photos Wound Measurements Length: (cm) 6 Width: (cm) 3 Depth: (cm) 0.1 Area: (cm) 14.137 Volume: (cm) 1.414 % Reduction in Area: -414.3% % Reduction in Volume: -414.2% Epithelialization: None Wound Description Classification: Grade 2 Fou Wound Margin: Flat and Intact Slo Exudate Amount: Medium Exudate Type: Serosanguineous Exudate Color:  red, brown l Odor After Cleansing: No ugh/Fibrino Yes Wound Bed Granulation Amount: Medium (34-66%) Exposed Structure Granulation Quality: Red Fascia Exposed: No Necrotic Amount: Medium (34-66%) Fat Layer (Subcutaneous Tissue) Exposed: Yes Necrotic Quality: Adherent Slough Tendon Exposed: No Muscle Exposed: No Joint Exposed: No Bone Exposed: No Treatment Notes Wound #33 (Left, Medial Lower Leg) Notes silvercel, xsorb, 4 layer bilateral Adam Merritt, Adam Merritt ([id number]) Electronic Signature(s) Signed: 10/03/2019 3:08:23 PM By: Army Melia Signed: 10/03/2019 4:19:26 PM By: Montey Hora Entered By: Army Melia on 10/03/2019 15:08:22 Adam Merritt  ([id number]) -------------------------------------------------------------------------------- Wound Assessment Details Patient Name: Adam Merritt Date of Service: 10/03/2019 3:00 PM Medical Record Number: [id number] Patient Account Number: [id number] Date of Birth/Sex: [birth year] (74 y.o. M) Treating RN: Montey Hora Primary Care Nakira Litzau: [name] Other Clinician: Referring Yuleidy Rappleye: [name] Treating Anntonette Madewell/Extender: STONE III, [name]: 1 Wound Status Wound Number: 34 Primary Diabetic Wound/Ulcer of the Lower Extremity Etiology: Wound Location: Left, Posterior Lower Leg Wound Open Wounding Event: Blister Status: Date Acquired: 09/18/2019 Comorbid Anemia, Lymphedema, Chronic Obstructive Pulmonary Weeks Of Treatment: 1 History: Disease (COPD), Sleep Apnea, Congestive Heart Failure, Clustered Wound: No Coronary Artery Disease, Hypertension, Peripheral Venous Disease, Type II Diabetes, Osteoarthritis, Neuropathy, Received Chemotherapy Photos Wound Measurements Length: (cm) 0 Width: (cm) 0 Depth: (cm) 0 Area: (cm) 0 Volume: (cm) 0 % Reduction in Area: 100% % Reduction in Volume: 100% Epithelialization: None Wound Description Classification: Grade 2 Exudate Amount: Medium Exudate Type: Serosanguineous Exudate Color: red, brown Foul Odor After Cleansing: No Slough/Fibrino Yes Wound Bed Granulation Amount: Medium (34-66%) Exposed Structure Granulation Quality: Red Fascia Exposed: No Necrotic Amount: Medium (34-66%) Fat Layer (Subcutaneous Tissue) Exposed: Yes Necrotic Quality: Adherent Slough Tendon Exposed: No Muscle Exposed: No Joint Exposed: No Bone Exposed: No Electronic Signature(s) Signed: 10/03/2019 3:08:52 PM By: Army Melia Signed: 10/03/2019 4:19:26 PM By: Montey Hora Entered By: Army Melia on 10/03/2019 15:08:51 Adam Merritt  ([id number]) -------------------------------------------------------------------------------- Vitals Details Patient Name: Adam Merritt Date of Service: 10/03/2019 3:00 PM Medical Record Number: [id number] Patient Account Number: [id number] Date of Birth/Sex: 1945-07-26 (74 y.o. M) Treating RN: Montey Hora Primary Care [place]: [name] Other Clinician: Referring Ramiro Pangilinan: [name] Treating Marty Sadlowski/Extender: Melburn Hake, [name]: 1 Vital Signs Time Taken: 14:45 Temperature (F): 98.5 Pulse (bpm): 79 Respiratory Rate (breaths/min): 20 Blood Pressure (mmHg): 166/72 Reference Range: 80 - 120 mg / dl Electronic Signature(s) Signed: 10/03/2019 4:14:40 PM By: Lorine Bears RCP, RRT, CHT Entered By: Lorine Bears on 10/03/2019 14:47:45

## 2019-10-05 DIAGNOSIS — I509 Heart failure, unspecified: Secondary | ICD-10-CM | POA: Diagnosis not present

## 2019-10-06 DIAGNOSIS — I890 Lymphedema, not elsewhere classified: Secondary | ICD-10-CM | POA: Diagnosis not present

## 2019-10-09 ENCOUNTER — Other Ambulatory Visit: Payer: Self-pay

## 2019-10-09 ENCOUNTER — Encounter: Payer: Self-pay | Admitting: Family

## 2019-10-09 ENCOUNTER — Ambulatory Visit: Payer: Medicare PPO | Attending: Family | Admitting: Family

## 2019-10-09 VITALS — BP 162/75 | HR 54 | Resp 18 | Ht 66.0 in | Wt 280.4 lb

## 2019-10-09 DIAGNOSIS — E785 Hyperlipidemia, unspecified: Secondary | ICD-10-CM | POA: Diagnosis not present

## 2019-10-09 DIAGNOSIS — I5032 Chronic diastolic (congestive) heart failure: Secondary | ICD-10-CM | POA: Diagnosis not present

## 2019-10-09 DIAGNOSIS — G4700 Insomnia, unspecified: Secondary | ICD-10-CM | POA: Insufficient documentation

## 2019-10-09 DIAGNOSIS — I890 Lymphedema, not elsewhere classified: Secondary | ICD-10-CM | POA: Insufficient documentation

## 2019-10-09 DIAGNOSIS — I10 Essential (primary) hypertension: Secondary | ICD-10-CM

## 2019-10-09 DIAGNOSIS — Z8673 Personal history of transient ischemic attack (TIA), and cerebral infarction without residual deficits: Secondary | ICD-10-CM | POA: Insufficient documentation

## 2019-10-09 DIAGNOSIS — E1140 Type 2 diabetes mellitus with diabetic neuropathy, unspecified: Secondary | ICD-10-CM | POA: Diagnosis not present

## 2019-10-09 DIAGNOSIS — R531 Weakness: Secondary | ICD-10-CM | POA: Diagnosis not present

## 2019-10-09 DIAGNOSIS — Z951 Presence of aortocoronary bypass graft: Secondary | ICD-10-CM | POA: Insufficient documentation

## 2019-10-09 DIAGNOSIS — Z794 Long term (current) use of insulin: Secondary | ICD-10-CM | POA: Insufficient documentation

## 2019-10-09 DIAGNOSIS — R0602 Shortness of breath: Secondary | ICD-10-CM | POA: Diagnosis not present

## 2019-10-09 DIAGNOSIS — Z79899 Other long term (current) drug therapy: Secondary | ICD-10-CM | POA: Diagnosis not present

## 2019-10-09 DIAGNOSIS — I2510 Atherosclerotic heart disease of native coronary artery without angina pectoris: Secondary | ICD-10-CM | POA: Diagnosis not present

## 2019-10-09 DIAGNOSIS — J449 Chronic obstructive pulmonary disease, unspecified: Secondary | ICD-10-CM | POA: Insufficient documentation

## 2019-10-09 DIAGNOSIS — I110 Hypertensive heart disease with heart failure: Secondary | ICD-10-CM | POA: Insufficient documentation

## 2019-10-09 DIAGNOSIS — N1832 Chronic kidney disease, stage 3b: Secondary | ICD-10-CM

## 2019-10-09 DIAGNOSIS — Z7982 Long term (current) use of aspirin: Secondary | ICD-10-CM | POA: Insufficient documentation

## 2019-10-09 DIAGNOSIS — Z8249 Family history of ischemic heart disease and other diseases of the circulatory system: Secondary | ICD-10-CM | POA: Insufficient documentation

## 2019-10-09 LAB — GLUCOSE, CAPILLARY: Glucose-Capillary: 144 mg/dL — ABNORMAL HIGH (ref 70–99)

## 2019-10-09 NOTE — Progress Notes (Signed)
[facility] - PHARMACIST COUNSELING NOTE  ADHERENCE ASSESSMENT  Adherence strategy: Patient's wife helps patient with medications. Utilizes a pillbox.   Do you ever forget to take your medication? [] Yes (1) [x] No (0)  Do you ever skip doses due to side effects? [] Yes (1) [x] No (0)  Do you have trouble affording your medicines? [] Yes (1) [x] No (0)  Are you ever unable to pick up your medication due to transportation difficulties? [] Yes (1) [x] No (0)  Do you ever stop taking your medications because you don't believe they are helping? [] Yes (1) [x] No (0)  Total score 0    Recommendations given to patient about increasing adherence: None   Guideline-Directed Medical Therapy/Evidence Based Medicine  ACE/ARB/ARNI: None Beta Blocker: Carvedilol 25 mg BID Aldosterone Antagonist: None Diuretic: Furosemide 40 mg daily, indapamide 2.5 mg daily    SUBJECTIVE  HPI: Patient is a 74 y/o M with PMH as below presenting to CHF clinic for follow-up.   Past Medical History:  Diagnosis Date  . Anemia   . CAD (coronary artery disease)   . Chest pain   . Coronary artery disease   . Diabetes mellitus without complication ([place])   . Diastolic CHF ([place])   . Esophageal reflux   . Herpes zoster without mention of complication   . Hyperlipidemia   . Hypertension   . Insomnia   . Lumbago   . Lymphedema   . Neuropathy in diabetes ([place])   . Obstructive chronic bronchitis without exacerbation ([place])   . Other malaise and fatigue   . Stroke ([place])   . Varicose veins        OBJECTIVE   Vital signs: HR 54, BP 162/75, weight (pounds) 280 ECHO: Date 09/16/16, EF 65-70%, notes: Grade 1 diastolic dysfunction  BMP Latest Ref Rng & Units 03/18/2019 02/14/2019 06/25/2018  Glucose 70 - 99 mg/dL 123(H) 191(H) 130(H)  BUN 8 - 23 mg/dL 34(H) 21 30(H)  Creatinine 0.61 - 1.24 mg/dL 2.76(H) 1.93(H) 1.73(H)  BUN/Creat Ratio 10 - 24 - - -  Sodium 135 - 145 mmol/L  137 136 138  Potassium 3.5 - 5.1 mmol/L 3.3(L) 3.8 3.4(L)  Chloride 98 - 111 mmol/L 99 95(L) 102  CO2 22 - 32 mmol/L 25 27 30   Calcium 8.9 - 10.3 mg/dL 8.8(L) 9.4 8.3(L)    ASSESSMENT Patient is accompanied by wife to clinic visit. Wife helps patient with medications at home. During medication review, patient and wife familiar with most medications but un-sure about others. Patient has bilateral LE ulcers and right second toe ulcer in presence of venous insufficiency and lymphedema for which he goes to the wound clinic. Patient reports he has neuropathic pain which he describes as pins and needles sensation which is worse at night. He reports he takes 12 ibuprofen per day for pain relief.    PLAN 1). Diastolic HF -Furosemide 40 mg daily -Symptoms appear controlled - discussed with provider and plan is for no changes to medication regimen today  2). Hypertension -Patient is hypertensive today to 162/75 but states he has not taken medications yet today -Carvedilol 25 mg BID, indapamide 2.5 mg daily, furosemide 40 mg daily, prazosin 2 mg HS, hydralazine 25 mg TID (un-clear if patient is still taking)  3). CKD stage III -Last creatinine available was 2.76 (normalized CrCl 23 mL/min) on 03/18/2019 from 1.93 on 02/14/2019. Patient reports he had labs drawn at [place] earlier this week due to concern with kidney function with ibuprofen use -  Patient reports he is still making urine normally -Counseled patient extensively on avoiding ibuprofen in setting of HF, hypertension, and CKD  4). T2DM -Last HgbA1C was 6.6% on 08/25/2019 which had increased from 6.3 on 03/13/2019 -Antidiabetic regimen includes insulin glargine 30 units daily and liraglutide 1.8 mg daily -Patient reports glucose monitor is broken and he is not checking BG. Advised patient to obtain new monitor. -Denies signs/symptoms of hypoglycemia. Endorses symptoms of hyperglycemia (increased thirst, hunger, urinary frequency - although on  diuretic therapy)  5). Hx of CVA -Aspirin 325 mg daily, atorvastatin 40 mg daily  6). Insomnia/nightmares/mood -Prazosin 2 mg qHS, trazodone 50 mg qHS, sertraline 100 mg daily  7). Gout -Febuxostat 40 mg daily (not taking) -Patient reports he has not had a gout flare in years and is no longer taking uric acid reducer -Recommended patient follow-up with PCP for recommendation on continuation of therapy -Patient takes indapamide which is a thiazide-like diuretic that can cause hyperuricemia  8). Venous leg ulcer -Pentoxifylline CR 400 mg TID -Recommended daily dose for CrCl <30 mL/min is 400 mg daily. If renal dysfunction persists and patient continues therapy may need to consider dose reduction  9). Neuropathic pain -Patient complains of neuropathic pain in feet which interferes with sleep -Reports he was on opioids in past but requested to be taken off of therapy due to news on opioid crisis -Gabapentin 300 mg 2 caps qAM, 1 caps qPM, 2 caps qHS (TDD 1500 mg), ibuprofen 12 tablets daily -Recommended MDD for CrCl 15-29 is 600 mg/day -If renal function persists may need to consider dose reduction. Patient is not drowsy in office   Time spent: 30 minutes  Assumption Resident 10/09/2019 12:29 PM    Current Outpatient Medications:  .  albuterol (VENTOLIN HFA) 108 (90 Base) MCG/ACT inhaler, Inhale 2 puffs into the lungs every 6 (six) hours as needed for wheezing or shortness of breath., Disp: 18 g, Rfl: 5 .  aspirin 325 MG EC tablet, Take 325 mg by mouth daily., Disp: , Rfl:  .  atorvastatin (LIPITOR) 40 MG tablet, Take 40 mg by mouth at bedtime. , Disp: , Rfl:  .  carvedilol (COREG) 25 MG tablet, Take 25 mg by mouth 2 (two) times daily with a meal., Disp: , Rfl:  .  docusate sodium (COLACE) 50 MG capsule, Take 50 mg by mouth 2 (two) times daily., Disp: , Rfl:  .  furosemide (LASIX) 40 MG tablet, Take 1 tablet (40 mg total) by mouth daily., Disp: 90 tablet, Rfl: 1 .   gabapentin (NEURONTIN) 300 MG capsule, Take 2 cap in the morning, 1 cap in the evening and 2 cap at night., Disp: 180 capsule, Rfl: 3 .  indapamide (LOZOL) 2.5 MG tablet, Take 1 tablet (2.5 mg total) by mouth daily., Disp: 30 tablet, Rfl: 5 .  Insulin Glargine (BASAGLAR KWIKPEN) 100 UNIT/ML SOPN, INJECT 30 TO 36 UNITS UNDER THE SKIN EVERY DAY (Patient taking differently: 30 Units daily. INJECT 30 TO 36 UNITS UNDER THE SKIN EVERY DAY), Disp: 3 pen, Rfl: 2 .  Insulin Pen Needle (BD PEN NEEDLE NANO U/F) 32G X 4 MM MISC, Use as directed with insulin DX e11.65, Disp: 100 each, Rfl: 11 .  liraglutide (VICTOZA) 18 MG/3ML SOPN, INJECT 1.8MG  INTO THE SKIN DAILY, Disp: 27 mL, Rfl: 3 .  Multiple Vitamin (MULTIVITAMIN WITH MINERALS) TABS tablet, Take 1 tablet by mouth daily., Disp: , Rfl:  .  OneTouch Delica Lancets 49F MISC, Use twice daily  diag e11.65, Disp: 100 each, Rfl: 11 .  pentoxifylline (TRENTAL) 400 MG CR tablet, TAKE 1 TABLET BY MOUTH THREE TIMES DAILY FOR CLAUDICATION, Disp: 270 tablet, Rfl: 3 .  potassium chloride SA (KLOR-CON) 20 MEQ tablet, Take 1 tablet (20 mEq total) by mouth 2 (two) times daily., Disp: 60 tablet, Rfl: 5 .  prazosin (MINIPRESS) 2 MG capsule, Take 2 mg by mouth at bedtime. , Disp: , Rfl:  .  sennosides-docusate sodium (SENOKOT-S) 8.6-50 MG tablet, Take 1-2 tablets by mouth 2 (two) times daily., Disp: , Rfl:  .  sertraline (ZOLOFT) 100 MG tablet, Take 100 mg by mouth daily. , Disp: , Rfl:  .  traZODone (DESYREL) 50 MG tablet, Take 50 mg by mouth at bedtime., Disp: , Rfl:  .  febuxostat (ULORIC) 40 MG tablet, Take 1 tablet (40 mg total) by mouth daily. (Patient not taking: Reported on 10/09/2019), Disp: 90 tablet, Rfl: 3 .  hydrALAZINE (APRESOLINE) 25 MG tablet, Take 25 mg by mouth 3 (three) times daily. , Disp: , Rfl:  .  ipratropium-albuterol (DUONEB) 0.5-2.5 (3) MG/3ML SOLN, Take 3 mLs by nebulization every 4 (four) hours as needed (for shortness of breath)., Disp: 360 mL, Rfl:  3 .  metolazone (ZAROXOLYN) 2.5 MG tablet, , Disp: , Rfl:  .  ondansetron (ZOFRAN) 4 MG tablet, Take 1 tablet (4 mg total) by mouth every 8 (eight) hours as needed. (Patient not taking: Reported on 10/09/2019), Disp: 20 tablet, Rfl: 0   COUNSELING POINTS/CLINICAL PEARLS  Carvedilol (Goal: weight less than 85 kg is 25 mg BID, weight greater than 85 kg is 50 mg BID)  Patient should avoid activities requiring coordination until drug effects are realized, as drug may cause dizziness.  This drug may cause diarrhea, nausea, vomiting, arthralgia, back pain, myalgia, headache, vision disorder, erectile dysfunction, reduced libido, or fatigue.  Instruct patient to report signs/symptoms of adverse cardiovascular effects such as hypotension (especially in elderly patients), arrhythmias, syncope, palpitations, angina, or edema.  Drug may mask symptoms of hypoglycemia. Advise diabetic patients to carefully monitor blood sugar levels.  Patient should take drug with food.  Advise patient against sudden discontinuation of drug. Furosemide  Drug causes sun-sensitivity. Advise patient to use sunscreen and avoid tanning beds. Patient should avoid activities requiring coordination until drug effects are realized, as drug may cause dizziness, vertigo, or blurred vision. This drug may cause hyperglycemia, hyperuricemia, constipation, diarrhea, loss of appetite, nausea, vomiting, purpuric disorder, cramps, spasticity, asthenia, headache, paresthesia, or scaling eczema. Instruct patient to report unusual bleeding/bruising or signs/symptoms of hypotension, infection, pancreatitis, or ototoxicity (tinnitus, hearing impairment). Advise patient to report signs/symptoms of a severe skin reactions (flu-like symptoms, spreading red rash, or skin/mucous membrane blistering) or erythema multiforme. Instruct patient to eat high-potassium foods during drug therapy, as directed by healthcare professional.  Patient should not drink  alcohol while taking this drug.  DRUGS TO AVOID IN HEART FAILURE  Drug or Class Mechanism  Analgesics . NSAIDs . COX-2 inhibitors . Glucocorticoids  Sodium and water retention, increased systemic vascular resistance, decreased response to diuretics   Diabetes Medications . Metformin . Thiazolidinediones o Rosiglitazone (Avandia) o Pioglitazone (Actos) . DPP4 Inhibitors o Saxagliptin (Onglyza) o Sitagliptin (Januvia)   Lactic acidosis Possible calcium channel blockade   Unknown  Antiarrhythmics . Class I  o Flecainide o Disopyramide . Class III o Sotalol . Other o Dronedarone  Negative inotrope, proarrhythmic   Proarrhythmic, beta blockade  Negative inotrope  Antihypertensives . Alpha Blockers o Doxazosin . Calcium  Channel Blockers o Diltiazem o Verapamil o Nifedipine . Central Alpha Adrenergics o Moxonidine . Peripheral Vasodilators o Minoxidil  Increases renin and aldosterone  Negative inotrope    Possible sympathetic withdrawal  Unknown  Anti-infective . Itraconazole . Amphotericin B  Negative inotrope Unknown  Hematologic . Anagrelide . Cilostazol   Possible inhibition of PD IV Inhibition of PD III causing arrhythmias  Neurologic/Psychiatric . Stimulants . Anti-Seizure Drugs o Carbamazepine o Pregabalin . Antidepressants o Tricyclics o Citalopram . Parkinsons o Bromocriptine o Pergolide o Pramipexole . Antipsychotics o Clozapine . Antimigraine o Ergotamine o Methysergide . Appetite suppressants . Bipolar o Lithium  Peripheral alpha and beta agonist activity  Negative inotrope and chronotrope Calcium channel blockade  Negative inotrope, proarrhythmic Dose-dependent QT prolongation  Excessive serotonin activity/valvular damage Excessive serotonin activity/valvular damage Unknown  IgE mediated hypersensitivy, calcium channel blockade  Excessive serotonin activity/valvular damage Excessive serotonin  activity/valvular damage Valvular damage  Direct myofibrillar degeneration, adrenergic stimulation  Antimalarials . Chloroquine . Hydroxychloroquine Intracellular inhibition of lysosomal enzymes  Urologic Agents . Alpha Blockers o Doxazosin o Prazosin o Tamsulosin o Terazosin  Increased renin and aldosterone  Adapted from Page RL, et al. "Drugs That May Cause or Exacerbate Heart Failure: A Scientific Statement from the [employer]." Circulation 2016; 088:P10-R15. DOI: 10.1161/CIR.0000000000000426   MEDICATION ADHERENCES TIPS AND STRATEGIES 1. Taking medication as prescribed improves patient outcomes in heart failure (reduces hospitalizations, improves symptoms, increases survival) 2. Side effects of medications can be managed by decreasing doses, switching agents, stopping drugs, or adding additional therapy. Please let someone in the [facility] know if you have having bothersome side effects so we can modify your regimen. Do not alter your medication regimen without talking to [place].  3. Medication reminders can help patients remember to take drugs on time. If you are missing or forgetting doses you can try linking behaviors, using pill boxes, or an electronic reminder like an alarm on your phone or an app. Some people can also get automated phone calls as medication reminders.

## 2019-10-09 NOTE — Progress Notes (Signed)
Patient ID: Adam Merritt, male    DOB: 1945/12/27, 74 y.o.   MRN: [id number]  HPI  Adam Merritt is a 74 y/o male with a history of CAD, DM, hyperlipidemia, HTN, stroke, COPD, lymphedema and chronic heart failure.   Echo report from 09/16/16 reviewed and showed an EF of 65-70%.  Hasn't been admitted or been in the ED for the last 6 months.   He presents today for a follow-up visit with a chief complaint of moderate shortness of breath upon minimal exertion. He describes this as chronic in nature having been present for several years. He has associated fatigue, chronic lymphedema, leg weakness and neuropathy pain in his legs along with this. He denies any difficulty sleeping, dizziness, abdominal distention, palpitations, chest pain, wheezing, cough or weight gain.   Says that he's not been weighing himself daily. Due to neuropathy pain, he's been taking 12 OTC ibuprofen daily trying to help with the pain so that he is able to sleep.   Continues to go to the wound center weekly and have lower leg dressings re-done.   Past Medical History:  Diagnosis Date  . Anemia   . CAD (coronary artery disease)   . Chest pain   . Coronary artery disease   . Diabetes mellitus without complication ([place])   . Diastolic CHF ([place])   . Esophageal reflux   . Herpes zoster without mention of complication   . Hyperlipidemia   . Hypertension   . Insomnia   . Lumbago   . Lymphedema   . Neuropathy in diabetes ([place])   . Obstructive chronic bronchitis without exacerbation ([place])   . Other malaise and fatigue   . Stroke ([place])   . Varicose veins    Past Surgical History:  Procedure Laterality Date  . 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 CHOLECYSTECTOMY    . CORONARY ARTERY BYPASS GRAFT     Family History  Problem Relation Age of Onset  . Diabetes Mother   . Hyperlipidemia Mother   .  Hypertension Mother   . Diabetes Father   . Hyperlipidemia Father   . Hypertension Father   . CAD Father    Social History   Tobacco Use  . Smoking status: Never Smoker  . Smokeless tobacco: Never Used  Substance Use Topics  . Alcohol use: No    Comment: quit alcohol 30 years ago   No Known Allergies  Past Medical History:  Diagnosis Date  . Anemia   . CAD (coronary artery disease)   . Chest pain   . Coronary artery disease   . Diabetes mellitus without complication ([place])   . Diastolic CHF ([place])   . Esophageal reflux   . Herpes zoster without mention of complication   . Hyperlipidemia   . Hypertension   . Insomnia   . Lumbago   . Lymphedema   . Neuropathy in diabetes ([place])   . Obstructive chronic bronchitis without exacerbation ([place])   . Other malaise and fatigue   . Stroke ([place])   . Varicose veins    Past Surgical History:  Procedure Laterality Date  . 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 CHOLECYSTECTOMY    . CORONARY ARTERY BYPASS GRAFT     Family History  Problem Relation  Age of Onset  . Diabetes Mother   . Hyperlipidemia Mother   . Hypertension Mother   . Diabetes Father   . Hyperlipidemia Father   . Hypertension Father   . CAD Father    Social History   Tobacco Use  . Smoking status: Never Smoker  . Smokeless tobacco: Never Used  Substance Use Topics  . Alcohol use: No    Comment: quit alcohol 30 years ago   No Known Allergies Prior to Admission medications   Medication Sig Start Date End Date Taking? Authorizing Provider  albuterol (VENTOLIN HFA) 108 (90 Base) MCG/ACT inhaler Inhale 2 puffs into the lungs every 6 (six) hours as needed for wheezing or shortness of breath. 08/25/19  Yes Boscia, Heather E, NP  aspirin 325 MG EC tablet Take 325 mg by mouth daily.   Yes [provider]  atorvastatin (LIPITOR) 40 MG tablet Take  40 mg by mouth at bedtime.    Yes [provider]  carvedilol (COREG) 25 MG tablet Take 25 mg by mouth 2 (two) times daily with a meal.   Yes [provider]  docusate sodium (COLACE) 50 MG capsule Take 50 mg by mouth 2 (two) times daily.   Yes [provider]  furosemide (LASIX) 40 MG tablet Take 1 tablet (40 mg total) by mouth daily. 08/13/19  Yes Ronnell Freshwater, NP  gabapentin (NEURONTIN) 300 MG capsule Take 2 cap in the morning, 1 cap in the evening and 2 cap at night. 08/25/19  Yes Boscia, Heather E, NP  indapamide (LOZOL) 2.5 MG tablet Take 1 tablet (2.5 mg total) by mouth daily. 03/06/19  Yes Romin Divita A, FNP  Insulin Glargine (BASAGLAR KWIKPEN) 100 UNIT/ML SOPN INJECT 30 TO 36 UNITS UNDER THE SKIN EVERY DAY Patient taking differently: 30 Units daily. INJECT 30 TO 36 UNITS UNDER THE SKIN EVERY DAY 04/02/19  Yes Boscia, Heather E, NP  Insulin Pen Needle (BD PEN NEEDLE NANO U/F) 32G X 4 MM MISC Use as directed with insulin DX e11.65 03/25/19  Yes Boscia, Heather E, NP  liraglutide (VICTOZA) 18 MG/3ML SOPN INJECT 1.8MG  INTO THE SKIN DAILY 09/22/19  Yes Boscia, Greer Ee, NP  Multiple Vitamin (MULTIVITAMIN WITH MINERALS) TABS tablet Take 1 tablet by mouth daily.   Yes [provider]  OneTouch Delica Lancets 16X MISC Use twice daily diag e11.65 03/24/19  Yes Boscia, Heather E, NP  pentoxifylline (TRENTAL) 400 MG CR tablet TAKE 1 TABLET BY MOUTH THREE TIMES DAILY FOR CLAUDICATION 08/13/19  Yes Boscia, Heather E, NP  potassium chloride SA (KLOR-CON) 20 MEQ tablet Take 1 tablet (20 mEq total) by mouth 2 (two) times daily. 04/11/19  Yes Damaree Sargent, Otila Kluver A, FNP  prazosin (MINIPRESS) 2 MG capsule Take 2 mg by mouth at bedtime.  10/17/18  Yes [provider]  sennosides-docusate sodium (SENOKOT-S) 8.6-50 MG tablet Take 1-2 tablets by mouth 2 (two) times daily.   Yes [provider]  sertraline (ZOLOFT) 100 MG tablet Take 100 mg by mouth daily.  10/17/18   Yes [provider]  traZODone (DESYREL) 50 MG tablet Take 50 mg by mouth at bedtime.   Yes [provider]  febuxostat (ULORIC) 40 MG tablet Take 1 tablet (40 mg total) by mouth daily. Patient not taking: Reported on 10/09/2019 03/17/19   Ronnell Freshwater, NP  hydrALAZINE (APRESOLINE) 25 MG tablet Take 25 mg by mouth 3 (three) times daily.  09/13/18   [provider]  ipratropium-albuterol (DUONEB) 0.5-2.5 (  3) MG/3ML SOLN Take 3 mLs by nebulization every 4 (four) hours as needed (for shortness of breath). 09/10/17   [name], MD  metolazone (ZAROXOLYN) 2.5 MG tablet  09/20/18   [provider]  ondansetron (ZOFRAN) 4 MG tablet Take 1 tablet (4 mg total) by mouth every 8 (eight) hours as needed. Patient not taking: Reported on 10/09/2019 02/15/19   Nance Pear, MD    Review of Systems  Constitutional: Positive for fatigue (tire easily). Negative for appetite change.  HENT: Positive for hearing loss. Negative for congestion and sore throat.   Eyes: Negative.   Respiratory: Positive for shortness of breath (with minimal exertion). Negative for cough, chest tightness and wheezing.   Cardiovascular: Positive for leg swelling (minimal). Negative for chest pain and palpitations.  Gastrointestinal: Negative for abdominal distention and abdominal pain.  Endocrine: Negative.   Genitourinary: Negative.   Musculoskeletal: Negative for back pain and neck pain.  Skin: Negative for wound.  Allergic/Immunologic: Negative.   Neurological: Positive for weakness (in legs) and numbness (& tingling in legs). Negative for dizziness and light-headedness.  Hematological: Negative for adenopathy. Does not bruise/bleed easily.  Psychiatric/Behavioral: Negative for dysphoric mood and sleep disturbance (sleeping on 2 pillows; oxgyen on 4L at bedtime although hasn't been wearing ). The patient is not nervous/anxious.    Vitals:   10/09/19 1139  BP: (!) 162/75  Pulse: (!) 54   Resp: 18  SpO2: 99%  Weight: 280 lb 6 oz (127.2 kg)  Height: 5\' 6"  (1.676 m)   Wt Readings from Last 3 Encounters:  10/09/19 280 lb 6 oz (127.2 kg)  08/25/19 (!) 320 lb (145.2 kg)  08/12/19 (!) 315 lb (142.9 kg)   Lab Results  Component Value Date   CREATININE 2.76 (H) 03/18/2019   CREATININE 1.93 (H) 02/14/2019   CREATININE 1.73 (H) 06/25/2018     Physical Exam Vitals and nursing note reviewed.  Constitutional:      Appearance: He is well-developed.  HENT:     Head: Normocephalic and atraumatic.  Cardiovascular:     Rate and Rhythm: Normal rate and regular rhythm.  Pulmonary:     Effort: Pulmonary effort is normal. No respiratory distress.     Breath sounds: No wheezing or rales.  Abdominal:     General: There is no distension.     Palpations: Abdomen is soft.  Musculoskeletal:     Cervical back: Normal range of motion and neck supple.     Right lower leg: No tenderness. Edema (lymphedema) present.     Left lower leg: No tenderness. Edema (lymphedema) present.  Skin:    General: Skin is warm and dry.  Neurological:     Mental Status: He is alert and oriented to person, place, and time.  Psychiatric:        Behavior: Behavior normal.    Assessment & Plan:  1: Chronic heart failure with preserved ejection fraction- - NYHA class III - euvolemic today although does have chronic lymphedema - not weighing himself at home; reviewed the importance of weighing daily first thing in the morning after using the bathroom, write the weight down and call for an overnight weight gain of >2 pounds or a weekly weight gain of >5 pounds - weight up 2 pounds from last visit 6 months ago - adds salt "on occasion" but does eat out almost every meal because they don't like to cook.  - saw cardiology Lyndel Pleasure) 03/07/18, explained that he needed to make an appointment  and their office # was placed on his AVS; also needs updated echo (last one done 2018) - BNP 04/16/18 was 29.0 - PharmD  reconciled medications with the patient  2: HTN- - BP mildly elevated today - saw PCP Junius Creamer) 08/25/19  - BMP from 03/18/2019 reviewed and showed sodium 137, potassium 3.3, creatinine 2.76, GFR 22 - he says that he had lab work done at the [place] a couple of day ago - explained that he needed to stop taking the ibuprofen; can try OTC tylenol but to follow up with PCP for neuropathy pain  3: DM- - A1c 12/17/2018 was 6.2% - Has not been checking sugar at home; nonfasting glucose in clinic today was 155  4: COPD-  - supposed to be wearing oxygen at 4L at bedtime but doesn't wear it every night; encouraged him to wear it every night when he goes to bed - saw pulmonologist Humphrey Rolls) 08/12/19  5: Lymphedema- - follows at the wound center and was last seen 10/03/19; says that he returns tomorrow - legs are currently wrapped - hasn't been elevating his legs much because he says that they hurt when he elevates them - has compression boots at home but hasn't been wearing them; says that they have to put his dog up when using them as the dog doesn't like them   Patient did not bring his medications nor a list. Each medication was verbally reviewed with the patient and he was encouraged to bring the bottles to every visit to confirm accuracy of list.  Due to HF stability, will not make a return appointment for patient at this time. Advised patient and his wife that he could call back at anytime to schedule another appointment and they both were comfortable with this plan.

## 2019-10-09 NOTE — Patient Instructions (Addendum)
Resume weighing daily and call for an overnight weight gain of > 2 pounds or a weekly weight gain of >5 pounds.   Call Dr. Nehemiah Massed (heart doctor) to schedule a routine appointment. Call [phone]   Call us in the future if you'd like to schedule another appointment.

## 2019-10-10 ENCOUNTER — Encounter: Payer: Medicare PPO | Attending: Physician Assistant | Admitting: Physician Assistant

## 2019-10-10 ENCOUNTER — Other Ambulatory Visit: Payer: Self-pay

## 2019-10-10 DIAGNOSIS — I110 Hypertensive heart disease with heart failure: Secondary | ICD-10-CM | POA: Insufficient documentation

## 2019-10-10 DIAGNOSIS — I2510 Atherosclerotic heart disease of native coronary artery without angina pectoris: Secondary | ICD-10-CM | POA: Diagnosis not present

## 2019-10-10 DIAGNOSIS — I509 Heart failure, unspecified: Secondary | ICD-10-CM | POA: Insufficient documentation

## 2019-10-10 DIAGNOSIS — J449 Chronic obstructive pulmonary disease, unspecified: Secondary | ICD-10-CM | POA: Insufficient documentation

## 2019-10-10 DIAGNOSIS — E11622 Type 2 diabetes mellitus with other skin ulcer: Secondary | ICD-10-CM | POA: Insufficient documentation

## 2019-10-10 DIAGNOSIS — L97222 Non-pressure chronic ulcer of left calf with fat layer exposed: Secondary | ICD-10-CM | POA: Insufficient documentation

## 2019-10-10 DIAGNOSIS — E11621 Type 2 diabetes mellitus with foot ulcer: Secondary | ICD-10-CM | POA: Diagnosis not present

## 2019-10-10 DIAGNOSIS — L97812 Non-pressure chronic ulcer of other part of right lower leg with fat layer exposed: Secondary | ICD-10-CM | POA: Diagnosis not present

## 2019-10-10 DIAGNOSIS — I890 Lymphedema, not elsewhere classified: Secondary | ICD-10-CM | POA: Insufficient documentation

## 2019-10-10 NOTE — Progress Notes (Addendum)
Adam Merritt, Adam Merritt ([id number]) Visit Report for 10/10/2019 Chief Complaint Document Details Patient Name: NOTNAMED, SCHOLZ Date of Service: 10/10/2019 10:45 AM Medical Record Number: [id number] Patient Account Number: [id number] Date of Birth/Sex: 10/09/1945 (74 y.o. M) Treating RN: Montey Hora Primary Care Provider: [name] Other Clinician: Referring Provider: [name] Treating Provider/Extender: Melburn Hake, Kiyan Burmester [name]: 2 Information Obtained from: Patient Chief Complaint Bilateral LE Ulcers and right second toe ulcer 09/26/2019; patient is here for review bilateral lower leg wounds. He is well familiar to this clinic Electronic Signature(s) Signed: 10/10/2019 10:54:09 AM By: Worthy Keeler PA-C Entered By: Worthy Keeler on 10/10/2019 10:54:09 Adam Merritt ([id number]) -------------------------------------------------------------------------------- HPI Details Patient Name: Adam Merritt Date of Service: 10/10/2019 10:45 AM Medical Record Number: [id number] Patient Account Number: [id number] Date of Birth/Sex: Oct 04, 1945 (74 y.o. M) Treating RN: Montey Hora Primary Care Provider: [name] Other Clinician: Referring Provider: [name] Treating Provider/Extender: Melburn Hake, Baylen Dea [name]: 2 History of Present Illness HPI Description: 10/18/17-He is here for initial evaluation of bilateral lower extremity ulcerations in the presence of venous insufficiency and lymphedema. He has been seen by vascular medicine in the past, Dr. Lucky Cowboy, last seen in 2016. He does have a history of abnormal ABIs, which is to be expected given his lymphedema and venous insufficiency. According to Epic, it appears that all attempts for arterial evaluation and/or angiography were not follow through with by patient. He does have a history of being seen in lymphedema clinic in 2018, stopped going approximately 6 months ago stating "it didn't do any good". He does not have  lymphedema pumps, he does not have custom fit compression wrap/stockings. He is diabetic and his recent A1c last month was 7.6. He admits to chronic bilateral lower extremity pain, no change in pain since blister and ulceration development. He is currently being treated with Levaquin for bronchitis. He has home health and we will continue. 10/25/17-He is here in follow-up evaluation for bilateral lower extremity ulcerationssubtle he remains on Levaquin for bronchitis. Right lower extremity with no evidence of drainage or ulceration, persistent left lower extremity ulceration. He states that home health has not been out since his appointment. He went to [facility] on Tuesday, studies revealed: RIGHT ABI 0.9, TBI 0.6 LEFT ABI 1.1, TBI 0.6 with triphasic flow bilaterally. We will continue his same treatment plan. He has been educated on compression therapy and need for elevation. He will benefit from lymphedema pumps 11/01/17-He is here in follow-up evaluation for left lower extremity ulcer. The right lower extremity remains healed. He has home health services, but they have not been out to see the patient for 2-3 weeks. He states it home health physical therapy changed his dressing yesterday after therapy; he placed Ace wrap compression. We are still waiting for lymphedema pumps, reordered d/t need for company change. 11/08/17-He is here in follow-up evaluation for left lower extremity ulcer. It is improved. Edema is significantly improved with compression therapy. We will continue with same treatment plan and he will follow-up next week. No word regarding lymphedema pumps 11/15/17-He is here in follow-up evaluation for left lower extremity ulcer. He is healed and will be discharged from wound care services. I have reached out to [facility] regarding his lymphedema pumps. They have been unable to reach the patient; the contact number they had with the patient's wife's cell phone and  she has not answered any unrecognized calls. Contact should be made today, trial planned  for next week; [facility] will continue to follow 11/27/17 on evaluation today patient has multiple blistered areas over the right lower extremity his left lower extremity appears to be doing okay. These blistered areas show signs of no infection which is great news. With that being said he did have some necrotic skin overlying which was mechanically debrided away with saline and gauze today without complication. Overall post debridement the wounds appear to be doing better but in general his swelling seems to be increased. This is obviously not good news. I think this is what has given rise to the blisters. 12/04/17 on evaluation today patient presents for follow-up concerning his bilateral lower extremity edema in the right lower extremity ulcers. He has been tolerating the dressing changes without complication. With that being said he has had no real issues with the wraps which is also good news. Overall I'm pleased with the progress he's been making. 12/11/17 on evaluation today patient appears to be doing rather well in regard to his right lateral lower extremity ulcer. He's been tolerating the dressing changes without complication. Fortunately there does not appear to be any evidence of infection at this time. Overall I'm pleased with the progress that is being made. Unfortunately he has been in the hospital due to having what sounds to be a stomach virus/flu fortunately that is starting to get better. 12/18/17 on evaluation today patient actually appears to be doing very well in regard to his bilateral lower extremities the swelling is under fairly good control his lymphedema pumps are still not up and running quite yet. With that being said he does have several areas of opening noted as far as wounds are concerned mainly over the left lower extremity. With that being said I do believe once he gets  lymphedema pumps this would least help you mention some the fluid and preventing this from occurring. Hopefully that will be set up soon sleeves are Artie in place at his home he just waiting for the machine. 12/25/17 on evaluation today patient actually appears to be doing excellent in fact all of his ulcers appear to have resolved his legs appear very well. I do think he needs compression stockings we have discussed this and they are actually going to go to [facility] today to elastic therapy to get this fitted for him. I think that is definitely a good thing to do. Readmission: 04/09/18 upon evaluation today this patient is seen for readmission due to bilateral lower extremity lymphedema. He has significant swelling of his extremities especially on the left although the right is also swollen he has weeping from both sides. There are no obvious open wounds at this point. Fortunately he has been doing fairly well for quite a bit of time since I last saw him. Nonetheless unfortunately this seems to have reopened and is giving quite a bit of trouble. He states this began about a week ago when he first called [place] to get in to be seen. No fevers, chills, nausea, or vomiting noted at this time. He has not been using his lymphedema pumps due to the fact that they won't fit on his leg at this point likewise is also not been using his compression for essentially the same reason. 04/16/18 upon evaluation today patient actually appears to be doing a little better in regard to the fluid in his bilateral lower extremities. With that being said he's had three falls since I saw him last week. He also states that he's been feeling  very poorly. I was concerned last week and feel like that the concern is still there as far as the congestion in his chest is concerned he seems to be breathing about the same as last week but again he states he's very weak he's not even able to walk further than from the chair to the door.  His wife had to buy a wheelchair just to be able to get them out of the house to get to the appointment today. This has me very concerned. 04/23/18 on evaluation today patient actually appears to be doing much better than last week's evaluation. At that time actually had to transport him to the ER via EMS and he subsequently was admitted for acute pulmonary edema, acute renal failure, and acute congestive heart failure. Fortunately he is doing much better. Apparently they did dialyze him and were able to take off roughly 35 pounds of fluid. Nonetheless he is feeling much better both in regard to his breathing and he's able to get around much better at this time compared to previous. Overall I'm very AVERILL, PONS ([id number]) happy with how things are at this time. There does not appear to be any evidence of infection currently. No fevers, chills, nausea, or vomiting noted at this time. 04/30/2018 patient seen today for follow-up and management of bilateral lower extremity lymphedema. He did express being more sad today than usual due to the recent loss of his dog. He states that he has been compliant with using the lymphedema pumps. However he does admit a minute over the last 2-3 days he has not been using the pumps due to the recent loss of his dog. At this time there is no drainage or open wounds to his lower extremities. The left leg edema is measuring smaller today. Still has a significant amount of edema on bilateral lower extremities With dry flaky skin. He will be referred to the lymphedema clinic for further management. Will continue 3 layer compression wraps and follow-up in 1-2 weeks.Denies any pain, fever, chairs recently. No recent falls or injuries reported during this visit. 05/07/18 on evaluation today patient actually appears to be doing very well in regard to his lower extremities in general all things considered. With that being said he is having some pain in the legs just due to  the amount of swelling. He does have an area where he had a blister on the left lateral lower extremity this is open at this point other than that there's nothing else weeping at this time. 05/14/18 on evaluation today patient actually appears to be doing excellent all things considered in regard to his lower extremities. He still has a couple areas of weeping on each leg which has continued to be the issue for him. He does have an appointment with the lymphedema clinic although this isn't until February 2020. That was the earliest they had. In the meantime he has continued to tolerate the compression wraps without complication. 05/28/18 on evaluation today patient actually appears to be doing more poorly in regard to his left lower extremity where he has a wound open at this point. He also had a fall where he subsequently injured his right great toe which has led to an open wound at the site unfortunately. He has been tolerating the dressing changes without complication in general as far as the wraps are concerned that he has not been putting any dressing on the left 1st toe ulcer site. 06/11/18 on evaluation today patient appears to  be doing much worse in regard to his bilateral lower extremity ulcers. He has been tolerating the dressing changes without complication although his legs have not been wrapped more recently. Overall I am not very pleased with the way his legs appear. I do believe he needs to be back in compression wraps he still has not received his compression wraps from the [facility] hospital as of yet. 06/18/18 on evaluation today patient actually appears to be doing significantly better than last time I saw him. He has been tolerating the compression wraps without complication in the circumferential ulcers especially appear to be doing much better. His toe ulcer on the right in regard to the great toe is better although not as good as the legs in my pinion. No fevers chills noted 07/02/18 on  evaluation today patient appears to be doing much better in regard to his lower extremity ulcers. Unfortunately since I last saw him he's had the distal portion of his right great toe if he dated it sounds as if this actually went downhill very quickly. I had only seen him a few days prior and the toe did not appear to be infected at that point subsequently became infected very rapidly and it was decided by the surgeon that the distal portion of the toe needed to be removed. The patient seems to be doing well in this regard he tells me. With that being said his lower extremities are doing better from the standpoint of the wounds although he is significantly swollen at this point. 07/09/18 on evaluation today patient appears to be doing better in regard to the wounds on his lower extremities. In fact everything is almost completely healed he is just a small area on the left posterior lower extremity that is open at this point. 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 With that being said he needs some kind of compression it does sound like he may not be wearing his compression, that is the wraps, during the entire time between when he's here visit to visit. Apparently his wife took the current one off because it began to "fall apart". 07/16/18 on evaluation today patient appears to be extremely swollen especially in regard to his left lower extremity unfortunately. He also has a new skin tear over the left lower extremity and there's a smaller area on the right lower extremity as well. Unfortunately this seems to be due in part to blistering and fluid buildup in his leg. He did get the reduction wraps that were ordered by the [facility] hospital for him to go to lymphedema clinic. With that being said his wounds on the legs have  not healed to the point to where they would likely accept them as a patient lymphedema clinic currently. We need to try to get this to heal. With that being said he's been taking his wraps off which is not doing him any favors at this point. In fact this is probably quite counterproductive compared to what needs to occur. We will likely need to increase to a four layer compression wrap and continue to also utilize elevation and he has to keep the wraps on not take them off as he's been doing currently hasn't had a wrap on since Saturday. 07/23/18 on evaluation today patient appears to be doing much better in regard to  his bilateral lower extremities. In fact his left lower extremity which was the largest is actually 15 cm smaller today compared to what it was last time he was here in our clinic. This is obviously good news after just one week. Nonetheless the differences he actually kept the wraps on during the entire week this time. That's not typical for him. I do believe he understands a little bit better now the severity of the situation and why it's important for him to keep these wraps on. 07/30/18 on evaluation today patient actually appears to be doing rather well in regard to his lower extremities. His legs are much smaller than they have been in the past and he actually has only one very small rudder superficial region remaining that is not closed on the left lateral/posterior lower extremity even this is almost completely close. I do believe likely next week he will be healed without any complications. I do think we need to continue the wraps however this seems to be beneficial for him. I also think it may be a good time for [place] to go ahead and see about getting the appointment with the lymphedema clinic which is supposed to be made for him in order to keep this moving along and hopefully get them into compression wraps that will in the end help him to remain healed. 08/06/18 on evaluation today  patient actually appears to be doing very well in regard as bilateral lower extremities. In fact his wounds appear to be completely healed at this time. 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 He is in the process of getting appointment with the lymphedema clinic we have made this referral were just waiting to hear back on the schedule time. We need to follow up on that today as well. 08/13/18 on evaluation today patient actually appears to be doing very well in regard to his bilateral lower extremities there are no open wounds at this point. We are gonna go ahead and see about ordering the Velcro compression wraps for him had a discussion with them about [place] doing it versus the [place] they feel like they can definitely afford going ahead and get the wraps themselves and they would prefer to try to avoid having to go to the lymphedema clinic if it all possible which I completely understand. As long as he has good compression I'm okay either way. 3/17//20 on evaluation today patient actually appears to be doing well in regard to his bilateral lower extremity ulcers. He has been tolerating the dressing changes without complication specifically the compression wraps. Overall is had no issues my fingers finding that I see at this point is that he is having trouble with constipation. He tells me he has not been able to go to the bathroom for about six days. He's taken to over-the- counter oral laxatives unfortunately this is not helping. He has not contacted his doctor. Adam Merritt, Adam Merritt ([id number]) 08/27/18 on evaluation today patient appears to be doing fairly well in regard to his lower extremities at this point. There does not appear to be any new altars is swelling is very well controlled. We are still waiting for his Velcro compression wraps to arrive that should be sometime in the next week is Artie sent the check for these. 09/03/18 on  evaluation today patient appears to be doing excellent in regard to his bilateral lower extremities which he shows no signs of wound openings in regard to  this point. He does have his Velcro compression wraps which did arrive in the mail since I saw him last week. Overall he is doing excellent in my pinion. 09/13/18 on evaluation today patient appears to be doing very well currently in regard to the overall appearance of his bilateral lower extremities although he's a little bit more swollen than last time we saw him. At that point he been discharged without any open wounds. Nonetheless he has a small open wound on the posterior left lower extremity with some evidence of cellulitis noted as well. Fortunately I feel like he has made good progress overall with regard to his lower extremities from were things used to be. 09/20/18 on evaluation today patient actually appears to be doing much better. The erythematous lower extremity is improving wound itself which is still open appears to be doing much better as far as but appearance as well as pain is concerned overall very pleased in this regard. There's no signs of active infection at this time. 09/27/18 on evaluation today patient's wounds on the lower extremity actually appear to be doing fairly well at this time which is good news. There is no evidence of active infection currently and again is just as left lower extremity were there any wounds at this point anyway. I believe they may be completely healed but again I'm not 100% sure based on evaluation today. I think one more week of observation would likely be a good idea. 10/04/18 on evaluation today patient actually appears to be doing excellent in regard to the left lower extremity which actually appears to be completely healed as of today. Unfortunately he's been having issues with his right lower extremity have a new wound that has opened. Fortunately there's no evidence of active infection at this  time which is good news. 10/10/18 upon evaluation today patient actually appears to be doing a little bit worse with the new open area on his right posterior lower extremity. He's been tolerating the dressing changes without complication. Right now we been using his compression wraps although I think we may need to switch back to actually performing bilateral compression wraps are in the clinic. No fevers, chills, nausea, or vomiting noted at this time. 10/17/18 on evaluation today patient actually appears to be doing quite well in regard to his bilateral lower extremity ulcers. He has been tolerating the reps without complication although he would prefer not to rewrap his legs as of today. Fortunately there's no signs of active infection which is good news. No fevers, chills, nausea, or vomiting noted at this time. 10/24/18 on evaluation today patient appears to be doing very well in regard to his lower extremities. His right lower extremity is shown signs of healing and his left lower Trinity though not healed appears to be improving which is excellent news. Overall very pleased with how things seem to be progressing at this time. The patient is likewise happy to hear this. His Velcro compression wraps however have not been put on properly will gonna show his wife how to do that properly today. 10/31/18 on evaluation today patient appears to be doing more poorly in regard to his left lower extremity in particular although both lower extremities actually are showing some signs of being worse than my previous evaluation. Unfortunately I'm just not sure that his compression stockings even with the use of the compression/lymphedema pumps seem to be controlling this well. Upon further questioning he tells me that he also is not able to  lie flat in the bed due to his congestive heart failure and difficulty breathing. For that reason he sleeps in his recliner. To make matters worse his recliner also cannot even  hold his legs up so instead of even being somewhat elevated they pretty much hang down to the floor. This is the way he sleeps each night which is definitely counterproductive to everything else that were attempting to do from the standpoint of controlling his fluid. Nonetheless I think that he potentially could benefit from a hospital bed although this would be something that his primary care provider would likely have to order since anything that is order on our side has to be directly related to wound care and again the hospital bed is not necessarily a direct relation to although I think it does contribute to his overall wound status on his lower extremities. 11/07/18 on evaluation today patient appears to be doing better in regard to his bilateral lower extremity. He's been tolerating the dressing changes without complication. We did in the interim since I last saw him switch to just using extras orbiting new alginate and that seems to have done much better for him. I'm very pleased with the overall progress that is made. 11/14/18 on evaluation today patient appears to be redoing rather well in regard to his left lower extremity ulcers which are the only ones remaining at this point. Fortunately there's no signs of active infection at this time which is good news. Overall been very pleased with how things seem to be progressing currently. No fevers, chills, nausea, or vomiting noted at this time. 11/20/17 on evaluation today patient actually appears to be doing quite well in regard to his lower extremities on the left on the right he has several blisters that showed up although there's some question about whether or not he has had his broker compression wraps on like he was supposed to or not. Fortunately there's no signs of active infection at this time which is good news. Unfortunately though he is doing well from the standpoint of the left leg the right leg is not doing as well again this is when  we did not wrap last week. 11/28/18 on evaluation today patient appears to be doing well in regard to his bilateral lower extremities is left appears to be healed is right is not healed but is very close to being so. Overall very pleased with how things seem to be progressing. Patient is likewise happy that things are doing well. 12/09/18 on evaluation today patient actually appears to be completely healed which is excellent news. He actually seems to be doing well in regard to the swelling of the bilateral lower extremities which is also great news. Overall very pleased with how things seem to be progressing. Readmission: 03/18/2019 upon evaluation today patient presents for reevaluation concerning issues that he is having with his left lower extremity where he does have a wound noted upon inspection today. He also has a wound on the right second toe on the tip where it is apparently been rubbing in his shoe I did look at issues and you can actually see where this has been occurring as well. Fortunately there is no signs of infection with regard to the toe necessarily although it is very swollen compared to normal that does have me somewhat concerned about the possibility of further evaluation for infection/osteomyelitis. I would recommend an x-ray to start with. Otherwise he states that it is been 1-2 weeks that he has  had the draining in regard to left lower extremity he does not know how this happened he has been wearing his compression appropriately and I do see that as well today but nonetheless I do think that he needs to continue to be very cautious with regard to elevation as well. Adam Merritt, Adam Merritt ([id number]) 03/25/2019 on evaluation today patient appears to be doing much better in regard to his lower extremity at this time. That is on the left. He did culture positive for Pseudomonas but the good news is he seems to be doing much better the gentamicin we applied topically seems to have done a  great job for him. There is no signs of active infection at this time systemically and locally he is doing much better. No fevers, chills, nausea, vomiting, or diarrhea. In regard to his right toe this seems to be doing much better there is very little pressure noted at this point I did clean up some of the callus today around the edges of the wound as well as the surface of the wound but to be honest he is progressing quite nicely. 10/30; the area on the left anterior lower tibia area is healed over. His edema control is adequate even though his use of the compression pumps seems very intermittent. He has a juxta lite stocking on the right leg and a think there is 1 for the left leg at home. His wife is putting these on. He has an area on the plantar right second toe which is a hammertoe they are trying to offload this 04/08/2019 on evaluation today patient appears to be doing well in regard to his lower extremities which is good news. We are seeing him today for his toe ulcer which is giving him a little bit more trouble but again still seems to be doing better at this point which is good news. He is going require some sharp debridement in regard to the toe today however. 04/15/2019 on evaluation today patient actually appears to be doing quite well with regard to his toe ulcer. I am very pleased with how things are doing in that regard. With regard to his lower extremity edema in general he tells me he is not been using the lymphedema pumps regularly although he does not use them. I think that he needs to use them more regularly he does have a small blister on the left lower extremity this is not open at this point but obviously it something that can get worse if he does not keep the compression going and the pumps going as well. 04/22/2019 on evaluation today patient appears to be doing well with regard to his toe ulcer. He has been tolerating the dressing changes without complication. This is  measuring slightly smaller this week as compared to last week. Fortunately there is no signs of active infection at this time. 05/06/2019 on evaluation today patient actually appears to be doing excellent. In fact his toe appears to be completely healed which is great news. There is no signs of active infection at this time. Readmission: Patient presents today for follow-up after having been discharged at the beginning of December 2020. He states that in recent weeks things have reopened and has been having more trouble at this point. With that being said fortunately there is no signs of active infection at this time. No fever chills noted. Nonetheless he also does have an issue with one of his toes as well that seems to be somewhat this is  on the right foot second toe. 07/25/2019 upon evaluation today patient's lower extremities appear to be doing excellent bilaterally. I really feel like he is showing signs of great improvement and in fact I think he is close to healing which is great news. There is no signs of active infection at this time. No fevers, chills, nausea, vomiting, or diarrhea. 07/31/2019 upon evaluation today patient appears to be doing excellent and in fact I think may be completely healed in regard to his right lower extremity that were still to monitor this 1 more week before closing it out. The left lower extremity is measuring smaller although not completely closed seems to be doing excellent. 08/07/2019 upon evaluation today patient appears to be doing excellent in regard to his wounds. In fact the right lower extremity is completely healed there is no issues here. The left lower extremity has 1 very small area still remaining fortunately there is no signs of infection and overall I think he is very close to closure of the here as well. 08/14/19 upon evaluation today patient appears to be doing excellent in regard to his lower extremities. In fact he appears to be completely  healed as of today. Fortunately there's no signs of active infection at this time. No fevers, chills, nausea, or vomiting noted at this time. READMISSION 09/26/2019 This is a 74 year old man who is well-known to this clinic having just been discharged on 08/14/2019. He has known lymphedema and chronic venous insufficiency. He has Farrow wrap stockings and also external compression pumps. He does not use the external compression pumps however. He does however apparently fairly faithfully use the stockings. They have not been lotion in his legs. He has developed new wounds on the right anterior as well as the left medial left lateral and left posterior calf. He returns for our review of this Past medical history includes coronary artery disease, hypertension, congestive heart failure, COPD, venous insufficiency with lymphedema, type 2 diabetes. He apparently has compression pumps, Farrow wraps and at 1 point a hospital bed although I believe he sleeps in a recliner 10/03/2019 upon evaluation today patient appears to be doing better with regard to his wounds in general. He has been tolerating the dressing changes without complication. Fortunately there is no signs of active infection. No fevers, chills, nausea, vomiting, or diarrhea. I do believe the compression wraps are helping as they always have in the past. 10/10/2019 upon evaluation today patient appears to be doing excellent at this point. His right lower extremity is measuring much better and in fact is completely healed. His left lower extremity is also measuring better though not healed seems to be doing excellent at this point which is great news. Overall I am very pleased with how things appear currently. Electronic Signature(s) Signed: 10/10/2019 3:39:51 PM By: Worthy Keeler PA-C Entered By: Worthy Keeler on 10/10/2019 15:39:50 Adam Merritt  ([id number]) -------------------------------------------------------------------------------- Physical Exam Details Patient Name: Adam Merritt Date of Service: 10/10/2019 10:45 AM Medical Record Number: [id number] Patient Account Number: [id number] Date of Birth/Sex: [birth year] (74 y.o. M) Treating RN: Montey Hora Primary Care Provider: [name] Other Clinician: Referring Provider: [name] Treating Provider/Extender: STONE III, Eduarda Scrivens [name]: 2 Constitutional Well-nourished and well-hydrated in no acute distress. Respiratory normal breathing without difficulty. Psychiatric this patient is able to make decisions and demonstrates good insight into disease process. Alert and Oriented x 3. pleasant and cooperative. Notes Patient's wounds currently again on the left lower extremity though not  healed showed signs of excellent granulation overall I feel like he is doing great and I see no signs of infection at this time. Electronic Signature(s) Signed: 10/10/2019 3:40:20 PM By: Worthy Keeler PA-C Entered By: Worthy Keeler on 10/10/2019 15:40:20 Adam Merritt ([id number]) -------------------------------------------------------------------------------- Physician Orders Details Patient Name: Adam Merritt Date of Service: 10/10/2019 10:45 AM Medical Record Number: [id number] Patient Account Number: [id number] Date of Birth/Sex: Jun 15, 1945 (74 y.o. M) Treating RN: Montey Hora Primary Care Provider: [name] Other Clinician: Referring Provider: [name] Treating Provider/Extender: Melburn Hake, Argenis Kumari [name]: 2 Verbal / Phone Orders: No Diagnosis Coding ICD-10 Coding Code Description I89.0 Lymphedema, not elsewhere classified L97.222 Non-pressure chronic ulcer of left calf with fat layer exposed L97.812 Non-pressure chronic ulcer of other part of right lower leg with fat layer exposed Wound Cleansing Wound #32 Left,Lateral Lower Leg o Clean wound  with Normal Saline. o May shower with protection. - Please do not get your wraps wet Wound #33 Left,Medial Lower Leg o Clean wound with Normal Saline. o May shower with protection. - Please do not get your wraps wet Skin Barriers/Peri-Wound Care o Moisturizing lotion - to both lower legs Primary Wound Dressing Wound #32 Left,Lateral Lower Leg o Silver Alginate Wound #33 Left,Medial Lower Leg o Silver Alginate Secondary Dressing Wound #32 Left,Lateral Lower Leg o ABD pad - carboflex Wound #33 Left,Medial Lower Leg o ABD pad - carboflex Dressing Change Frequency o Change dressing every week - Please call for nurse visit if your wraps start to slide down your legs Follow-up Appointments o Return Appointment in 1 week. o Nurse Visit as needed - Please call for nurse visit if your wraps start to slide down your legs Edema Control Wound #32 Left,Lateral Lower Leg o 4 Layer Compression System - Bilateral o Elevate legs to the level of the heart and pump ankles as often as possible o Compression Pump: Use compression pump on left lower extremity for 60 minutes, twice daily. o Compression Pump: Use compression pump on right lower extremity for 60 minutes, twice daily. Wound #33 Left,Medial Lower Leg o 4 Layer Compression System - Bilateral o Elevate legs to the level of the heart and pump ankles as often as possible o Compression Pump: Use compression pump on left lower extremity for 60 minutes, twice daily. o Compression Pump: Use compression pump on right lower extremity for 60 minutes, twice daily. Electronic Signature(s) SHANAN, MCMILLER ([id number]) Signed: 10/10/2019 3:24:15 PM By: Montey Hora Signed: 10/10/2019 4:02:49 PM By: Worthy Keeler PA-C Entered By: Montey Hora on 10/10/2019 11:04:02 Adam Merritt ([id number]) -------------------------------------------------------------------------------- Problem List Details Patient Name:  Adam Merritt Date of Service: 10/10/2019 10:45 AM Medical Record Number: [id number] Patient Account Number: [id number] Date of Birth/Sex: 10-11-1945 (74 y.o. M) Treating RN: Montey Hora Primary Care Provider: [name] Other Clinician: Referring Provider: [name] Treating Provider/Extender: Melburn Hake, Saveah Bahar [name]: 2 Active Problems ICD-10 Encounter Code Description Active Date MDM Diagnosis I89.0 Lymphedema, not elsewhere classified 09/26/2019 No Yes L97.222 Non-pressure chronic ulcer of left calf with fat layer exposed 09/26/2019 No Yes L97.812 Non-pressure chronic ulcer of other part of right lower leg with fat layer 09/26/2019 No Yes exposed Inactive Problems Resolved Problems Electronic Signature(s) Signed: 10/10/2019 10:54:02 AM By: Worthy Keeler PA-C Entered By: Worthy Keeler on 10/10/2019 10:54:02 Adam Merritt ([id number]) -------------------------------------------------------------------------------- Progress Note Details Patient Name: Adam Merritt Date of Service: 10/10/2019 10:45 AM Medical Record Number: [id number] Patient  Account Number: [id number] Date of Birth/Sex: 02-18-1946 (74 y.o. M) Treating RN: Montey Hora Primary Care Provider: [name] Other Clinician: Referring Provider: [name] Treating Provider/Extender: Melburn Hake, Cathryne Mancebo [name]: 2 Subjective Chief Complaint Information obtained from Patient Bilateral LE Ulcers and right second toe ulcer 09/26/2019; patient is here for review bilateral lower leg wounds. He is well familiar to this clinic History of Present Illness (HPI) 10/18/17-He is here for initial evaluation of bilateral lower extremity ulcerations in the presence of venous insufficiency and lymphedema. He has been seen by vascular medicine in the past, Dr. Lucky Cowboy, last seen in 2016. He does have a history of abnormal ABIs, which is to be expected given his lymphedema and venous insufficiency. According to  Epic, it appears that all attempts for arterial evaluation and/or angiography were not follow through with by patient. He does have a history of being seen in lymphedema clinic in 2018, stopped going approximately 6 months ago stating "it didn't do any good". He does not have lymphedema pumps, he does not have custom fit compression wrap/stockings. He is diabetic and his recent A1c last month was 7.6. He admits to chronic bilateral lower extremity pain, no change in pain since blister and ulceration development. He is currently being treated with Levaquin for bronchitis. He has home health and we will continue. 10/25/17-He is here in follow-up evaluation for bilateral lower extremity ulcerationssubtle he remains on Levaquin for bronchitis. Right lower extremity with no evidence of drainage or ulceration, persistent left lower extremity ulceration. He states that home health has not been out since his appointment. He went to [facility] on Tuesday, studies revealed: RIGHT ABI 0.9, TBI 0.6 LEFT ABI 1.1, TBI 0.6 with triphasic flow bilaterally. We will continue his same treatment plan. He has been educated on compression therapy and need for elevation. He will benefit from lymphedema pumps 11/01/17-He is here in follow-up evaluation for left lower extremity ulcer. The right lower extremity remains healed. He has home health services, but they have not been out to see the patient for 2-3 weeks. He states it home health physical therapy changed his dressing yesterday after therapy; he placed Ace wrap compression. We are still waiting for lymphedema pumps, reordered d/t need for company change. 11/08/17-He is here in follow-up evaluation for left lower extremity ulcer. It is improved. Edema is significantly improved with compression therapy. We will continue with same treatment plan and he will follow-up next week. No word regarding lymphedema pumps 11/15/17-He is here in follow-up evaluation for  left lower extremity ulcer. He is healed and will be discharged from wound care services. I have reached out to [facility] regarding his lymphedema pumps. They have been unable to reach the patient; the contact number they had with the patient's wife's cell phone and she has not answered any unrecognized calls. Contact should be made today, trial planned for next week; [facility] will continue to follow 11/27/17 on evaluation today patient has multiple blistered areas over the right lower extremity his left lower extremity appears to be doing okay. These blistered areas show signs of no infection which is great news. With that being said he did have some necrotic skin overlying which was mechanically debrided away with saline and gauze today without complication. Overall post debridement the wounds appear to be doing better but in general his swelling seems to be increased. This is obviously not good news. I think this is what has given rise to the blisters. 12/04/17  on evaluation today patient presents for follow-up concerning his bilateral lower extremity edema in the right lower extremity ulcers. He has been tolerating the dressing changes without complication. With that being said he has had no real issues with the wraps which is also good news. Overall I'm pleased with the progress he's been making. 12/11/17 on evaluation today patient appears to be doing rather well in regard to his right lateral lower extremity ulcer. He's been tolerating the dressing changes without complication. Fortunately there does not appear to be any evidence of infection at this time. Overall I'm pleased with the progress that is being made. Unfortunately he has been in the hospital due to having what sounds to be a stomach virus/flu fortunately that is starting to get better. 12/18/17 on evaluation today patient actually appears to be doing very well in regard to his bilateral lower extremities the swelling is  under fairly good control his lymphedema pumps are still not up and running quite yet. With that being said he does have several areas of opening noted as far as wounds are concerned mainly over the left lower extremity. With that being said I do believe once he gets lymphedema pumps this would least help you mention some the fluid and preventing this from occurring. Hopefully that will be set up soon sleeves are Artie in place at his home he just waiting for the machine. 12/25/17 on evaluation today patient actually appears to be doing excellent in fact all of his ulcers appear to have resolved his legs appear very well. I do think he needs compression stockings we have discussed this and they are actually going to go to [place] today to elastic therapy to get this fitted for him. I think that is definitely a good thing to do. Readmission: 04/09/18 upon evaluation today this patient is seen for readmission due to bilateral lower extremity lymphedema. He has significant swelling of his extremities especially on the left although the right is also swollen he has weeping from both sides. There are no obvious open wounds at this point. Fortunately he has been doing fairly well for quite a bit of time since I last saw him. Nonetheless unfortunately this seems to have reopened and is giving quite a bit of trouble. He states this began about a week ago when he first called [place] to get in to be seen. No fevers, chills, nausea, or vomiting noted at this time. He has not been using his lymphedema pumps due to the fact that they won't fit on his leg at this point likewise is also not been using his compression for essentially the same reason. 04/16/18 upon evaluation today patient actually appears to be doing a little better in regard to the fluid in his bilateral lower extremities. With that being said he's had three falls since I saw him last week. He also states that he's been feeling very poorly. I was  concerned last week and feel like that the concern is still there as far as the congestion in his chest is concerned he seems to be breathing about the same as last week but again he states he's very weak he's not even able to walk further than from the chair to the door. His wife had to buy a wheelchair just to be able to get them out of the house to get to the appointment today. This has me very concerned. Adam Merritt, Adam Merritt ([id number]) 04/23/18 on evaluation today patient actually appears to be doing much better  than last week's evaluation. At that time actually had to transport him to the ER via EMS and he subsequently was admitted for acute pulmonary edema, acute renal failure, and acute congestive heart failure. Fortunately he is doing much better. Apparently they did dialyze him and were able to take off roughly 35 pounds of fluid. Nonetheless he is feeling much better both in regard to his breathing and he's able to get around much better at this time compared to previous. Overall I'm very happy with how things are at this time. There does not appear to be any evidence of infection currently. No fevers, chills, nausea, or vomiting noted at this time. 04/30/2018 patient seen today for follow-up and management of bilateral lower extremity lymphedema. He did express being more sad today than usual due to the recent loss of his dog. He states that he has been compliant with using the lymphedema pumps. However he does admit a minute over the last 2-3 days he has not been using the pumps due to the recent loss of his dog. At this time there is no drainage or open wounds to his lower extremities. The left leg edema is measuring smaller today. Still has a significant amount of edema on bilateral lower extremities With dry flaky skin. He will be referred to the lymphedema clinic for further management. Will continue 3 layer compression wraps and follow-up in 1-2 weeks.Denies any pain, fever, chairs  recently. No recent falls or injuries reported during this visit. 05/07/18 on evaluation today patient actually appears to be doing very well in regard to his lower extremities in general all things considered. With that being said he is having some pain in the legs just due to the amount of swelling. He does have an area where he had a blister on the left lateral lower extremity this is open at this point other than that there's nothing else weeping at this time. 05/14/18 on evaluation today patient actually appears to be doing excellent all things considered in regard to his lower extremities. He still has a couple areas of weeping on each leg which has continued to be the issue for him. He does have an appointment with the lymphedema clinic although this isn't until February 2020. That was the earliest they had. In the meantime he has continued to tolerate the compression wraps without complication. 05/28/18 on evaluation today patient actually appears to be doing more poorly in regard to his left lower extremity where he has a wound open at this point. He also had a fall where he subsequently injured his right great toe which has led to an open wound at the site unfortunately. He has been tolerating the dressing changes without complication in general as far as the wraps are concerned that he has not been putting any dressing on the left 1st toe ulcer site. 06/11/18 on evaluation today patient appears to be doing much worse in regard to his bilateral lower extremity ulcers. He has been tolerating the dressing changes without complication although his legs have not been wrapped more recently. Overall I am not very pleased with the way his legs appear. I do believe he needs to be back in compression wraps he still has not received his compression wraps from the [facility] as of yet. 06/18/18 on evaluation today patient actually appears to be doing significantly better than last time I saw him. He has  been tolerating the compression wraps without complication in the circumferential ulcers especially appear to be  doing much better. His toe ulcer on the right in regard to the great toe is better although not as good as the legs in my pinion. No fevers chills noted 07/02/18 on evaluation today patient appears to be doing much better in regard to his lower extremity ulcers. Unfortunately since I last saw him he's had the distal portion of his right great toe if he dated it sounds as if this actually went downhill very quickly. I had only seen him a few days prior and the toe did not appear to be infected at that point subsequently became infected very rapidly and it was decided by the surgeon that the distal portion of the toe needed to be removed. The patient seems to be doing well in this regard he tells me. With that being said his lower extremities are doing better from the standpoint of the wounds although he is significantly swollen at this point. 07/09/18 on evaluation today patient appears to be doing better in regard to the wounds on his lower extremities. In fact everything is almost completely healed he is just a small area on the left posterior lower extremity that is open at this point. 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 With that being said he needs some kind of compression it does sound like he may not be wearing his compression, that is the wraps, during the entire time between when he's here visit to visit. Apparently his wife took the current one off because it began to "fall apart". 07/16/18 on evaluation today patient appears to be extremely swollen especially in regard to his left lower extremity unfortunately. He also has a new skin tear over the left lower extremity and there's a smaller area  on the right lower extremity as well. Unfortunately this seems to be due in part to blistering and fluid buildup in his leg. He did get the reduction wraps that were ordered by the [facility] for him to go to lymphedema clinic. With that being said his wounds on the legs have not healed to the point to where they would likely accept them as a patient lymphedema clinic currently. We need to try to get this to heal. With that being said he's been taking his wraps off which is not doing him any favors at this point. In fact this is probably quite counterproductive compared to what needs to occur. We will likely need to increase to a four layer compression wrap and continue to also utilize elevation and he has to keep the wraps on not take them off as he's been doing currently hasn't had a wrap on since Saturday. 07/23/18 on evaluation today patient appears to be doing much better in regard to his bilateral lower extremities. In fact his left lower extremity which was the largest is actually 15 cm smaller today compared to what it was last time he was here in our clinic. This is obviously good news after just one week. Nonetheless the differences he actually kept the wraps on during the entire week this time. That's not typical for him. I do believe he understands a little bit better now the severity of the situation and why it's important for him to keep these wraps on. 07/30/18 on evaluation today patient actually appears to be doing rather well in regard to his  lower extremities. His legs are much smaller than they have been in the past and he actually has only one very small rudder superficial region remaining that is not closed on the left lateral/posterior lower extremity even this is almost completely close. I do believe likely next week he will be healed without any complications. I do think we need to continue the wraps however this seems to be beneficial for him. I also think it may be a good time  for [place] to go ahead and see about getting the appointment with the lymphedema clinic which is supposed to be made for him in order to keep this moving along and hopefully get them into compression wraps that will in the end help him to remain healed. 08/06/18 on evaluation today patient actually appears to be doing very well in regard as bilateral lower extremities. In fact his wounds appear to be completely healed at this time. 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 He is in the process of getting appointment with the lymphedema clinic we have made this referral were just waiting to hear back on the schedule time. We need to follow up on that today as well. 08/13/18 on evaluation today patient actually appears to be doing very well in regard to his bilateral lower extremities there are no open wounds at this point. We are gonna go ahead and see about ordering the Velcro compression wraps for him had a discussion with them about [place] doing it versus the [place] they feel like they can definitely afford going ahead and get the wraps themselves and they would prefer to try to avoid having to go to the lymphedema clinic if it all possible which I completely understand. As long as he has good compression I'm okay either way. Adam Merritt, Adam Merritt ([id number]) 3/17//20 on evaluation today patient actually appears to be doing well in regard to his bilateral lower extremity ulcers. He has been tolerating the dressing changes without complication specifically the compression wraps. Overall is had no issues my fingers finding that I see at this point is that he is having trouble with constipation. He tells me he has not been able to go to the bathroom for about six days. He's taken to over-the- counter oral laxatives unfortunately this is not helping. He has not contacted his doctor. 08/27/18 on evaluation today patient appears to be doing fairly well in regard to  his lower extremities at this point. There does not appear to be any new altars is swelling is very well controlled. We are still waiting for his Velcro compression wraps to arrive that should be sometime in the next week is Artie sent the check for these. 09/03/18 on evaluation today patient appears to be doing excellent in regard to his bilateral lower extremities which he shows no signs of wound openings in regard to this point. He does have his Velcro compression wraps which did arrive in the mail since I saw him last week. Overall he is doing excellent in my pinion. 09/13/18 on evaluation today patient appears to be doing very well currently in regard to the overall appearance of his bilateral lower extremities although he's a little bit more swollen than last time we saw him. At that point he been discharged without any open wounds. Nonetheless he has a small open wound on the posterior left lower extremity with some evidence of cellulitis noted as well. Fortunately I feel like he has made good  progress overall with regard to his lower extremities from were things used to be. 09/20/18 on evaluation today patient actually appears to be doing much better. The erythematous lower extremity is improving wound itself which is still open appears to be doing much better as far as but appearance as well as pain is concerned overall very pleased in this regard. There's no signs of active infection at this time. 09/27/18 on evaluation today patient's wounds on the lower extremity actually appear to be doing fairly well at this time which is good news. There is no evidence of active infection currently and again is just as left lower extremity were there any wounds at this point anyway. I believe they may be completely healed but again I'm not 100% sure based on evaluation today. I think one more week of observation would likely be a good idea. 10/04/18 on evaluation today patient actually appears to be doing  excellent in regard to the left lower extremity which actually appears to be completely healed as of today. Unfortunately he's been having issues with his right lower extremity have a new wound that has opened. Fortunately there's no evidence of active infection at this time which is good news. 10/10/18 upon evaluation today patient actually appears to be doing a little bit worse with the new open area on his right posterior lower extremity. He's been tolerating the dressing changes without complication. Right now we been using his compression wraps although I think we may need to switch back to actually performing bilateral compression wraps are in the clinic. No fevers, chills, nausea, or vomiting noted at this time. 10/17/18 on evaluation today patient actually appears to be doing quite well in regard to his bilateral lower extremity ulcers. He has been tolerating the reps without complication although he would prefer not to rewrap his legs as of today. Fortunately there's no signs of active infection which is good news. No fevers, chills, nausea, or vomiting noted at this time. 10/24/18 on evaluation today patient appears to be doing very well in regard to his lower extremities. His right lower extremity is shown signs of healing and his left lower Trinity though not healed appears to be improving which is excellent news. Overall very pleased with how things seem to be progressing at this time. The patient is likewise happy to hear this. His Velcro compression wraps however have not been put on properly will gonna show his wife how to do that properly today. 10/31/18 on evaluation today patient appears to be doing more poorly in regard to his left lower extremity in particular although both lower extremities actually are showing some signs of being worse than my previous evaluation. Unfortunately I'm just not sure that his compression stockings even with the use of the compression/lymphedema pumps seem  to be controlling this well. Upon further questioning he tells me that he also is not able to lie flat in the bed due to his congestive heart failure and difficulty breathing. For that reason he sleeps in his recliner. To make matters worse his recliner also cannot even hold his legs up so instead of even being somewhat elevated they pretty much hang down to the floor. This is the way he sleeps each night which is definitely counterproductive to everything else that were attempting to do from the standpoint of controlling his fluid. Nonetheless I think that he potentially could benefit from a hospital bed although this would be something that his primary care provider would likely have to  order since anything that is order on our side has to be directly related to wound care and again the hospital bed is not necessarily a direct relation to although I think it does contribute to his overall wound status on his lower extremities. 11/07/18 on evaluation today patient appears to be doing better in regard to his bilateral lower extremity. He's been tolerating the dressing changes without complication. We did in the interim since I last saw him switch to just using extras orbiting new alginate and that seems to have done much better for him. I'm very pleased with the overall progress that is made. 11/14/18 on evaluation today patient appears to be redoing rather well in regard to his left lower extremity ulcers which are the only ones remaining at this point. Fortunately there's no signs of active infection at this time which is good news. Overall been very pleased with how things seem to be progressing currently. No fevers, chills, nausea, or vomiting noted at this time. 11/20/17 on evaluation today patient actually appears to be doing quite well in regard to his lower extremities on the left on the right he has several blisters that showed up although there's some question about whether or not he has had his  broker compression wraps on like he was supposed to or not. Fortunately there's no signs of active infection at this time which is good news. Unfortunately though he is doing well from the standpoint of the left leg the right leg is not doing as well again this is when we did not wrap last week. 11/28/18 on evaluation today patient appears to be doing well in regard to his bilateral lower extremities is left appears to be healed is right is not healed but is very close to being so. Overall very pleased with how things seem to be progressing. Patient is likewise happy that things are doing well. 12/09/18 on evaluation today patient actually appears to be completely healed which is excellent news. He actually seems to be doing well in regard to the swelling of the bilateral lower extremities which is also great news. Overall very pleased with how things seem to be progressing. Readmission: 03/18/2019 upon evaluation today patient presents for reevaluation concerning issues that he is having with his left lower extremity where he does have a wound noted upon inspection today. He also has a wound on the right second toe on the tip where it is apparently been rubbing in his shoe I did look at issues and you can actually see where this has been occurring as well. Fortunately there is no signs of infection with regard to Adam Merritt, Adam Merritt. ([id number]) the toe necessarily although it is very swollen compared to normal that does have me somewhat concerned about the possibility of further evaluation for infection/osteomyelitis. I would recommend an x-ray to start with. Otherwise he states that it is been 1-2 weeks that he has had the draining in regard to left lower extremity he does not know how this happened he has been wearing his compression appropriately and I do see that as well today but nonetheless I do think that he needs to continue to be very cautious with regard to elevation as well. 03/25/2019 on  evaluation today patient appears to be doing much better in regard to his lower extremity at this time. That is on the left. He did culture positive for Pseudomonas but the good news is he seems to be doing much better the gentamicin we applied topically  seems to have done a great job for him. There is no signs of active infection at this time systemically and locally he is doing much better. No fevers, chills, nausea, vomiting, or diarrhea. In regard to his right toe this seems to be doing much better there is very little pressure noted at this point I did clean up some of the callus today around the edges of the wound as well as the surface of the wound but to be honest he is progressing quite nicely. 10/30; the area on the left anterior lower tibia area is healed over. His edema control is adequate even though his use of the compression pumps seems very intermittent. He has a juxta lite stocking on the right leg and a think there is 1 for the left leg at home. His wife is putting these on. He has an area on the plantar right second toe which is a hammertoe they are trying to offload this 04/08/2019 on evaluation today patient appears to be doing well in regard to his lower extremities which is good news. We are seeing him today for his toe ulcer which is giving him a little bit more trouble but again still seems to be doing better at this point which is good news. He is going require some sharp debridement in regard to the toe today however. 04/15/2019 on evaluation today patient actually appears to be doing quite well with regard to his toe ulcer. I am very pleased with how things are doing in that regard. With regard to his lower extremity edema in general he tells me he is not been using the lymphedema pumps regularly although he does not use them. I think that he needs to use them more regularly he does have a small blister on the left lower extremity this is not open at this point but obviously  it something that can get worse if he does not keep the compression going and the pumps going as well. 04/22/2019 on evaluation today patient appears to be doing well with regard to his toe ulcer. He has been tolerating the dressing changes without complication. This is measuring slightly smaller this week as compared to last week. Fortunately there is no signs of active infection at this time. 05/06/2019 on evaluation today patient actually appears to be doing excellent. In fact his toe appears to be completely healed which is great news. There is no signs of active infection at this time. Readmission: Patient presents today for follow-up after having been discharged at the beginning of December 2020. He states that in recent weeks things have reopened and has been having more trouble at this point. With that being said fortunately there is no signs of active infection at this time. No fever chills noted. Nonetheless he also does have an issue with one of his toes as well that seems to be somewhat this is on the right foot second toe. 07/25/2019 upon evaluation today patient's lower extremities appear to be doing excellent bilaterally. I really feel like he is showing signs of great improvement and in fact I think he is close to healing which is great news. There is no signs of active infection at this time. No fevers, chills, nausea, vomiting, or diarrhea. 07/31/2019 upon evaluation today patient appears to be doing excellent and in fact I think may be completely healed in regard to his right lower extremity that were still to monitor this 1 more week before closing it out. The left lower extremity  is measuring smaller although not completely closed seems to be doing excellent. 08/07/2019 upon evaluation today patient appears to be doing excellent in regard to his wounds. In fact the right lower extremity is completely healed there is no issues here. The left lower extremity has 1 very small area still  remaining fortunately there is no signs of infection and overall I think he is very close to closure of the here as well. 08/14/19 upon evaluation today patient appears to be doing excellent in regard to his lower extremities. In fact he appears to be completely healed as of today. Fortunately there's no signs of active infection at this time. No fevers, chills, nausea, or vomiting noted at this time. READMISSION 09/26/2019 This is a 74 year old man who is well-known to this clinic having just been discharged on 08/14/2019. He has known lymphedema and chronic venous insufficiency. He has Farrow wrap stockings and also external compression pumps. He does not use the external compression pumps however. He does however apparently fairly faithfully use the stockings. They have not been lotion in his legs. He has developed new wounds on the right anterior as well as the left medial left lateral and left posterior calf. He returns for our review of this Past medical history includes coronary artery disease, hypertension, congestive heart failure, COPD, venous insufficiency with lymphedema, type 2 diabetes. He apparently has compression pumps, Farrow wraps and at 1 point a hospital bed although I believe he sleeps in a recliner 10/03/2019 upon evaluation today patient appears to be doing better with regard to his wounds in general. He has been tolerating the dressing changes without complication. Fortunately there is no signs of active infection. No fevers, chills, nausea, vomiting, or diarrhea. I do believe the compression wraps are helping as they always have in the past. 10/10/2019 upon evaluation today patient appears to be doing excellent at this point. His right lower extremity is measuring much better and in fact is completely healed. His left lower extremity is also measuring better though not healed seems to be doing excellent at this point which is great news. Overall I am very pleased with how things  appear currently. [address] Adam Merritt, Adam Merritt ([id number]) Constitutional Well-nourished and well-hydrated in no acute distress. Vitals Time Taken: 10:42 AM, Temperature: 98.5 F, Pulse: 79 bpm, Respiratory Rate: 16 breaths/min, Blood Pressure: 156/88 mmHg. Respiratory normal breathing without difficulty. Psychiatric this patient is able to make decisions and demonstrates good insight into disease process. Alert and Oriented x 3. pleasant and cooperative. General Notes: Patient's wounds currently again on the left lower extremity though not healed showed signs of excellent granulation overall I feel like he is doing great and I see no signs of infection at this time. Integumentary (Hair, Skin) Wound #31 status is Healed - Epithelialized. Original cause of wound was Blister. The wound is located on the Right,Anterior Lower Leg. The wound measures 0cm length x 0cm width x 0cm depth; 0cm^2 area and 0cm^3 volume. There is Fat Layer (Subcutaneous Tissue) Exposed exposed. There is a medium amount of serosanguineous drainage noted. There is medium (34-66%) red granulation within the wound bed. There is a medium (34-66%) amount of necrotic tissue within the wound bed including Adherent Slough. Wound #32 status is Open. Original cause of wound was Blister. The wound is located on the Left,Lateral Lower Leg. The wound measures 5.3cm length x 7.4cm width x 0.1cm depth; 30.803cm^2 area and 3.08cm^3 volume. There is Fat Layer (Subcutaneous Tissue) Exposed exposed. There is a medium  amount of serosanguineous drainage noted. There is medium (34-66%) red granulation within the wound bed. There is a medium (34-66%) amount of necrotic tissue within the wound bed including Adherent Slough. Wound #33 status is Open. Original cause of wound was Blister. The wound is located on the Left,Medial Lower Leg. The wound measures 5.7cm length x 3.2cm width x 0.1cm depth; 14.326cm^2 area and 1.433cm^3 volume. There is Fat  Layer (Subcutaneous Tissue) Exposed exposed. There is a medium amount of serosanguineous drainage noted. The wound margin is flat and intact. There is medium (34-66%) red granulation within the wound bed. There is a medium (34-66%) amount of necrotic tissue within the wound bed including Adherent Slough. 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 Post procedure Diagnosis Wound #: Same as Pre-Procedure Plan Wound Cleansing: Wound #32 Left,Lateral Lower Leg: Clean wound with Normal Saline. May shower with protection. - Please do not get your wraps wet Wound #33 Left,Medial Lower Leg: Clean wound with Normal Saline. May shower with protection. - Please do not get your wraps wet Skin Barriers/Peri-Wound Care: Moisturizing lotion - to both lower legs Primary Wound Dressing: Wound #32 Left,Lateral Lower Leg: Silver Alginate Adam Merritt, Adam Merritt ([id number]) Wound #33 Left,Medial Lower Leg: Silver Alginate Secondary Dressing: Wound #32 Left,Lateral Lower Leg: ABD pad - carboflex Wound #33 Left,Medial Lower Leg: ABD pad - carboflex Dressing Change Frequency: Change dressing every week - Please call for nurse visit if your wraps start to slide down your legs Follow-up Appointments: Return Appointment in 1 week. Nurse Visit as needed - Please call for nurse visit if your wraps start to slide down your legs Edema Control: Wound #32 Left,Lateral Lower Leg: 4 Layer Compression System - Bilateral Elevate legs to the level of the heart and pump ankles as often as possible Compression Pump: Use compression pump on left lower extremity for 60 minutes, twice daily. Compression Pump: Use compression pump on right lower extremity for 60  minutes, twice daily. Wound #33 Left,Medial Lower Leg: 4 Layer Compression System - Bilateral Elevate legs to the level of the heart and pump ankles as often as possible Compression Pump: Use compression pump on left lower extremity for 60 minutes, twice daily. Compression Pump: Use compression pump on right lower extremity for 60 minutes, twice daily. 1. My suggestion currently is going to be that we go ahead and initiate treatment with a continuation of the silver alginate dressing to left lower extremity. This seems to be doing well for him and I am very pleased with the overall appearance. 2. With regard to the right lower extremity we will get a wrap his leg although it is completely healed he is going out of town and we did order [facility] for him but unfortunately the size that came was much too small. For that reason I think that we need to go ahead and wrap him this week while we get the appropriate wrap sent to his home. 3. The patient still needs to continue to elevate his legs is much as possible. We will see patient back for reevaluation in 1 week here in the clinic. If anything worsens or changes patient will contact our office for additional recommendations. Electronic Signature(s) Signed: 10/10/2019 3:41:16 PM By: Worthy Keeler PA-C Entered By: Worthy Keeler  on 10/10/2019 15:41:15 Adam Merritt, Adam Merritt ([id number]) -------------------------------------------------------------------------------- SuperBill Details Patient Name: Adam Merritt, Adam Merritt Date of Service: 10/10/2019 Medical Record Number: [id number] Patient Account Number: [id number] Date of Birth/Sex: 04-23-1946 (74 y.o. M) Treating RN: Montey Hora Primary Care Provider: [name] Other Clinician: Referring Provider: [name] Treating Provider/Extender: Melburn Hake, Marisue Canion [name]: 2 Diagnosis Coding ICD-10 Codes Code Description I89.0 Lymphedema, not elsewhere classified L97.222 Non-pressure chronic  ulcer of left calf with fat layer exposed L97.812 Non-pressure chronic ulcer of other part of right lower leg with fat layer exposed Facility Procedures CPT4: Description Modifier Quantity Code [id number] BILATERAL: Application of multi-layer venous compression system; leg (below knee), including 1 ankle and foot. 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

## 2019-10-11 NOTE — Progress Notes (Addendum)
JHOVANY, WEIDINGER ([id number]) Visit Report for 10/10/2019 Arrival Information Details Patient Name: TOBY, BREITHAUPT Date of Service: 10/10/2019 10:45 AM Medical Record Number: [id number] Patient Account Number: [id number] Date of Birth/Sex: [birth year] (74 y.o. M) Treating RN: Army Melia Primary Care Kevin Space: [name] Other Clinician: Referring Amareon Phung: [name] Treating Dempsey Ahonen/Extender: Melburn Hake, [name]: 2 Visit Information History Since Last Visit Added or deleted any medications: No Patient Arrived: Cane Any new allergies or adverse reactions: No Arrival Time: 10:41 Had a fall or experienced change in No Accompanied By: wife activities of daily living that may affect Transfer Assistance: None risk of falls: Patient Identification Verified: Yes Signs or symptoms of abuse/neglect since last visito No Patient Has Alerts: Yes Hospitalized since last visit: No Patient Alerts: Patient on Blood Thinner Has Dressing in Place as Prescribed: Yes DMII Pain Present Now: No Aspirin 325 Electronic Signature(s) Signed: 10/10/2019 10:52:34 AM By: Army Melia Entered By: Army Melia on 10/10/2019 10:41:56 [name] ([id number]) -------------------------------------------------------------------------------- Compression Therapy Details Patient Name: [name] Date of Service: 10/10/2019 10:45 AM Medical Record Number: [id number] Patient Account Number: [id number] Date of Birth/Sex: [birth year] (74 y.o. M) Treating RN: Montey Hora Primary Care Novalyn Lajara: [name] Other Clinician: Referring Ralpheal Zappone: [name] Treating Hatsue Sime/Extender: Melburn Hake, [name]: 2 Compression Therapy Performed for Wound Assessment: NonWound Condition Lymphedema - Bilateral Leg Performed By: Clinician Montey Hora, RN Compression Type: Four Layer Post Procedure Diagnosis Same as Pre-procedure Electronic Signature(s) Unsigned Entered By: Montey Hora  on 10/10/2019 11:00:58 Signature(s): Date(s): [name] ([id number]) -------------------------------------------------------------------------------- Encounter Discharge Information Details Patient Name: DONTEL, HARSHBERGER Date of Service: 10/10/2019 10:45 AM Medical Record Number: [id number] Patient Account Number: [id number] Date of Birth/Sex: [birth year] (75 y.o. M) Treating RN: Montey Hora Primary Care Jamesia Linnen: [name] Other Clinician: Referring Zebulon Gantt: [name] Treating Chondra Boyde/Extender: Melburn Hake, [name]: 2 Encounter Discharge Information Items Discharge Condition: Stable Ambulatory Status: Ambulatory Discharge Destination: Home Transportation: Private Auto Accompanied By: wife Schedule Follow-up Appointment: Yes Clinical Summary of Care: Electronic Signature(s) Unsigned Entered By: Montey Hora on 10/10/2019 11:08:34 Signature(s): Date(s): [name] ([id number]) -------------------------------------------------------------------------------- Lower Extremity Assessment Details Patient Name: LAMONDRE, WESCHE Date of Service: 10/10/2019 10:45 AM Medical Record Number: [id number] Patient Account Number: [id number] Date of Birth/Sex: 17-Aug-1945 (74 y.o. M) Treating RN: Army Melia Primary Care Lenay Lovejoy: [name] Other Clinician: Referring Rochell Mabie: [name] Treating Conan Mcmanaway/Extender: Melburn Hake, [name]: 2 Edema Assessment Assessed: [Left: No] [Right: No] Edema: [Left: Yes] [Right: Yes] Calf Left: Right: Point of Measurement: 31 cm From Medial Instep 59 cm 47 cm Ankle Left: Right: Point of Measurement: 11 cm From Medial Instep 36.5 cm 29 cm Vascular Assessment Pulses: Dorsalis Pedis Palpable: [Left:Yes] [Right:Yes] Electronic Signature(s) Signed: 10/10/2019 10:52:34 AM By: Army Melia Entered By: Army Melia on 10/10/2019 10:48:20 [name]  ([id number]) -------------------------------------------------------------------------------- Multi Wound Chart Details Patient Name: [name] Date of Service: 10/10/2019 10:45 AM Medical Record Number: [id number] Patient Account Number: [id number] Date of Birth/Sex: August 26, 1945 (74 y.o. 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/Ulcer of the Lower Diabetic  Wound/Ulcer of the Lower Extremity Extremity Extremity Comorbid History: Anemia, Lymphedema, Chronic Anemia, Lymphedema, Chronic Anemia, Lymphedema, Chronic Obstructive Pulmonary Disease Obstructive Pulmonary Disease Obstructive Pulmonary Disease (COPD), Sleep Apnea, Congestive (COPD), Sleep Apnea, Congestive (COPD), Sleep Apnea, Congestive Heart Failure, Coronary Artery Heart Failure, Coronary Artery Heart Failure, Coronary Artery Disease, Hypertension, Peripheral Disease, Hypertension, Peripheral Disease, Hypertension, Peripheral Venous Disease, Type II Diabetes, Venous Disease, Type II Diabetes, Venous Disease, Type II Diabetes, Osteoarthritis, Neuropathy, Osteoarthritis, Neuropathy, Osteoarthritis, Neuropathy, Received Chemotherapy Received Chemotherapy Received Chemotherapy Date Acquired: 09/18/2019 09/18/2019 09/18/2019 Weeks of Treatment: 2 2 2  Wound Status: Open Open Open Measurements L x W x D (cm) 0.1x0.1x0.1 5.3x7.4x0.1 5.7x3.2x0.1 Area (cm) : 0.008 30.803 14.326 Volume (cm) : 0.001 3.08 1.433 % Reduction in Area: 99.90%  39.70% -421.10% % Reduction in Volume: 99.90% 39.70% -421.10% Classification: Grade 2 Grade 2 Grade 2 Exudate Amount: Medium Medium Medium Exudate Type: Serosanguineous Serosanguineous Serosanguineous Exudate Color: red, brown red, brown red, brown Wound Margin: N/A N/A Flat and Intact Granulation Amount: Medium (34-66%) Medium (34-66%) Medium (34-66%) Granulation Quality: Red Red Red Necrotic Amount: Medium (34-66%) Medium (34-66%) Medium (34-66%) Exposed Structures: Fat Layer (Subcutaneous Tissue) Fat Layer (Subcutaneous Tissue) Fat Layer (Subcutaneous Tissue) Exposed: Yes Exposed: Yes Exposed: Yes Fascia: No Fascia: No Fascia: No Tendon: No Tendon: No Tendon: No Muscle: No Muscle: No Muscle: No Joint: No Joint: No Joint: No Bone: No Bone: No Bone: No Epithelialization: None None None Treatment Notes Electronic Signature(s) LINELL, SHAWN ([id number]) Unsigned Entered By: Montey Hora on 10/10/2019 10:55:23 Signature(s): Date(s): [name] ([id number]) -------------------------------------------------------------------------------- [place] Details Patient Name: [name] Date of Service: 10/10/2019 10:45 AM Medical Record Number: [id number] Patient Account Number: [id number] Date of Birth/Sex: 1945-09-21 (74 y.o. M) Treating RN: Montey Hora Primary Care Hajar Penninger: [name] Other Clinician: Referring Melicia Esqueda: [name] Treating Easter Schinke/Extender: Melburn Hake, [name]: 2 Active Inactive Abuse / Safety / Falls / Self Care Management Nursing Diagnoses: Potential for falls Goals: Patient will remain injury free related to falls Date Initiated: 09/26/2019 Target Resolution Date: 12/13/2019 Goal Status: Active Interventions: Assess fall risk on admission and as needed Notes: Venous Leg Ulcer Nursing Diagnoses: Actual venous Insuffiency (use after diagnosis is confirmed) Goals: Patient will maintain optimal  edema control Date Initiated: 09/26/2019 Target Resolution Date: 12/13/2019 Goal Status: Active Interventions: Compression as ordered Notes: Wound/Skin Impairment Nursing Diagnoses: Impaired tissue integrity Goals: Ulcer/skin breakdown will heal within 14 weeks Date Initiated: 09/26/2019 Target Resolution Date: 12/13/2019 Goal Status: Active Interventions: Assess patient/caregiver ability to obtain necessary supplies Assess patient/caregiver ability to perform ulcer/skin care regimen upon admission and as needed Assess ulceration(s) every visit Notes: Electronic Signature(s) Unsigned Entered By: Montey Hora on 10/10/2019 10:55:10 Signature(s): Date(s): [name] ([id number]) -------------------------------------------------------------------------------- Pain Assessment Details Patient Name: BARUCH, LEWERS Date of Service: 10/10/2019 10:45 AM Medical Record Number: [id number] Patient Account Number: [id number] Date of Birth/Sex: 12/27/1945 (74 y.o. M) Treating RN: Army Melia Primary Care Daysie Helf: [name] Other Clinician: Referring Shar Paez: [name] Treating Harshini Trent/Extender: Melburn Hake, [name]: 2 Active Problems Location of Pain Severity and Description of Pain Patient Has Paino No Site Locations Pain Management and Medication Current Pain Management: Electronic Signature(s) Signed: 10/10/2019 10:52:34 AM By: Army Melia Entered By: Army Melia on 10/10/2019 10:42:53 [name] ([id number]) -------------------------------------------------------------------------------- Patient/Caregiver Education Details Patient Name: [name] Date of Service: 10/10/2019 10:45 AM Medical Record Number: [id number] Patient Account Number: [id number] Date of Birth/Gender: 1945/07/11 (74 y.o. M) Treating  RN: Montey Hora Primary Care Physician: [name] Other Clinician: Referring Physician: [name] Treating Physician/Extender: Sharalyn Ink in Treatment: 2 Education Assessment Education Provided To: Patient Education Topics Provided Wound/Skin Impairment: Handouts: Caring for Your Ulcer Methods: Explain/Verbal Responses: State content correctly Electronic Signature(s) Unsigned Entered By: Montey Hora on 10/10/2019 11:06:33 Signature(s): Date(s): [name] ([id number]) -------------------------------------------------------------------------------- Wound Assessment Details Patient Name: JEFRY, LESINSKI Date of Service: 10/10/2019 10:45 AM Medical Record Number: [id number] Patient Account Number: [id number] Date of Birth/Sex: November 14, 1945 (74 y.o. M) Treating RN: Montey Hora Primary Care Selvin Yun: [name] Other Clinician: Referring Illyria Sobocinski: [name] Treating Ragena Fiola/Extender: STONE III, [name]: 2 Wound Status Wound Number: 31 Primary Diabetic Wound/Ulcer of the Lower Extremity Etiology: Wound Location: Right, Anterior Lower Leg Wound Healed - Epithelialized Wounding Event: Blister Status: Date Acquired: 09/18/2019 Comorbid Anemia, Lymphedema, Chronic Obstructive Pulmonary Weeks Of Treatment: 2 History: Disease (COPD), Sleep Apnea, Congestive Heart Failure, Clustered Wound: No Coronary Artery Disease, Hypertension, Peripheral Venous Disease, Type II Diabetes, Osteoarthritis, Neuropathy, Received Chemotherapy Photos Wound Measurements Length: (cm) 0 Width: (cm) 0 Depth: (cm) 0 Area: (cm) 0 Volume: (cm) 0 % Reduction in Area: 100% % Reduction in Volume: 100% Epithelialization: None Wound Description Classification: Grade 2 Exudate Amount: Medium Exudate Type: Serosanguineous Exudate Color: red, brown Foul Odor After Cleansing: No Slough/Fibrino Yes Wound Bed Granulation Amount: Medium (34-66%) Exposed Structure Granulation Quality: Red Fascia Exposed: No Necrotic Amount: Medium (34-66%) Fat Layer (Subcutaneous Tissue) Exposed: Yes Necrotic  Quality: Adherent Slough Tendon Exposed: No Muscle Exposed: No Joint Exposed: No Bone Exposed: No Electronic Signature(s) Unsigned Previous Signature: 10/10/2019 10:52:34 AM Version By: Army Melia Entered By: Montey Hora on 10/10/2019 11:03:13 Signature(s): [name] ([id number]) Date(s): -------------------------------------------------------------------------------- Wound Assessment Details Patient Name: [name] Date of Service: 10/10/2019 10:45 AM Medical Record Number: [id number] Patient Account Number: [id number] Date of Birth/Sex: [birth year] (74 y.o. M) Treating RN: Army Melia Primary Care Hodge Stachnik: [name] Other Clinician: Referring Liisa Picone: [name] Treating Doriana Mazurkiewicz/Extender: STONE III, [name]: 2 Wound Status Wound Number: 32 Primary Diabetic Wound/Ulcer of the Lower Extremity Etiology: Wound Location: Left, Lateral Lower Leg Wound Open Wounding Event: Blister Status: Date Acquired: 09/18/2019 Comorbid Anemia, Lymphedema, Chronic Obstructive Pulmonary Weeks Of Treatment: 2 History: Disease (COPD), Sleep Apnea, Congestive Heart Failure, Clustered Wound: No Coronary Artery Disease, Hypertension, Peripheral Venous Disease, Type II Diabetes, Osteoarthritis, Neuropathy, Received Chemotherapy Photos Wound Measurements Length: (cm) 5.3 Width: (cm) 7.4 Depth: (cm) 0.1 Area: (cm) 30.803 Volume: (cm) 3.08 % Reduction in Area: 39.7% % Reduction in Volume: 39.7% Epithelialization: None Wound Description Classification: Grade 2 F Exudate Amount: Medium S Exudate Type: Serosanguineous Exudate Color: red, brown oul Odor After Cleansing: No lough/Fibrino Yes Wound Bed Granulation Amount: Medium (34-66%) Exposed Structure Granulation Quality: Red Fascia Exposed: No Necrotic Amount: Medium (34-66%) Fat Layer (Subcutaneous Tissue) Exposed: Yes Necrotic Quality: Adherent Slough Tendon Exposed: No Muscle Exposed: No Joint  Exposed: No Bone Exposed: No Treatment Notes Wound #32 (Left, Lateral Lower Leg) 1. Cleansed with: Cleanse wound with antibacterial soap and water 3. Peri-wound Care: Moisturizing lotion ZAIAH, ECKERSON. ([id number]) 4. Dressing Applied: Calcium Alginate with Silver 5. Secondary Dressing Applied ABD Pad 7. Secured with 4 Layer Compression System - Bilateral Tubigrip Electronic Signature(s) Signed: 10/10/2019 10:52:34 AM By: Army Melia Entered By: Army Melia on 10/10/2019 10:51:12 [name] ([id number]) -------------------------------------------------------------------------------- Wound Assessment Details Patient Name: [name] Date of Service: 10/10/2019 10:45 AM Medical Record Number: [id number]  Patient Account Number: [id number] Date of Birth/Sex: [birth year] (74 y.o. M) Treating RN: Army Melia Primary Care Rody Keadle: [name] Other Clinician: Referring Donaldson Richter: [name] Treating Ronya Gilcrest/Extender: STONE III, [name]: 2 Wound Status Wound Number: 33 Primary Diabetic Wound/Ulcer of the Lower Extremity Etiology: Wound Location: Left, Medial Lower Leg Wound Open Wounding Event: Blister Status: Date Acquired: 09/18/2019 Comorbid Anemia, Lymphedema, Chronic Obstructive Pulmonary Weeks Of Treatment: 2 History: Disease (COPD), Sleep Apnea, Congestive Heart Failure, Clustered Wound: No Coronary Artery Disease, Hypertension, Peripheral Venous Disease, Type II Diabetes, Osteoarthritis, Neuropathy, Received Chemotherapy Photos Wound Measurements Length: (cm) 5.7 Width: (cm) 3.2 Depth: (cm) 0.1 Area: (cm) 14.326 Volume: (cm) 1.433 % Reduction in Area: -421.1% % Reduction in Volume: -421.1% Epithelialization: None Wound Description Classification: Grade 2 F Wound Margin: Flat and Intact S Exudate Amount: Medium Exudate Type: Serosanguineous Exudate Color: red, brown oul Odor After Cleansing: No lough/Fibrino Yes Wound  Bed Granulation Amount: Medium (34-66%) Exposed Structure Granulation Quality: Red Fascia Exposed: No Necrotic Amount: Medium (34-66%) Fat Layer (Subcutaneous Tissue) Exposed: Yes Necrotic Quality: Adherent Slough Tendon Exposed: No Muscle Exposed: No Joint Exposed: No Bone Exposed: No Treatment Notes Wound #33 (Left, Medial Lower Leg) 1. Cleansed with: Cleanse wound with antibacterial soap and water 3. Peri-wound Care: DAMIANO, STAMPER ([id number]) Moisturizing lotion 4. Dressing Applied: Calcium Alginate with Silver 5. Secondary Dressing Applied ABD Pad 7. Secured with 4 Layer Compression System - Bilateral Tubigrip Electronic Signature(s) Signed: 10/10/2019 10:52:34 AM By: Army Melia Entered By: Army Melia on 10/10/2019 10:51:31 [name] ([id number]) -------------------------------------------------------------------------------- Vitals Details Patient Name: [name] Date of Service: 10/10/2019 10:45 AM Medical Record Number: [id number] Patient Account Number: [id number] Date of Birth/Sex: 03-30-1946 (74 y.o. M) Treating RN: Army Melia Primary Care Mitul Hallowell: [name] Other Clinician: Referring Raiford Fetterman: [name] Treating Marymargaret Kirker/Extender: Melburn Hake, [name]: 2 Vital Signs Time Taken: 10:42 Temperature (F): 98.5 Pulse (bpm): 79 Respiratory Rate (breaths/min): 16 Blood Pressure (mmHg): 156/88 Reference Range: 80 - 120 mg / dl Electronic Signature(s) Signed: 10/10/2019 10:52:34 AM By: Army Melia Entered By: Army Melia on 10/10/2019 10:42:46

## 2019-10-17 ENCOUNTER — Encounter: Payer: Medicare PPO | Admitting: Physician Assistant

## 2019-10-20 ENCOUNTER — Other Ambulatory Visit: Payer: Self-pay

## 2019-10-20 DIAGNOSIS — E1165 Type 2 diabetes mellitus with hyperglycemia: Secondary | ICD-10-CM

## 2019-10-20 DIAGNOSIS — I110 Hypertensive heart disease with heart failure: Secondary | ICD-10-CM | POA: Diagnosis not present

## 2019-10-20 DIAGNOSIS — I890 Lymphedema, not elsewhere classified: Secondary | ICD-10-CM | POA: Diagnosis not present

## 2019-10-20 DIAGNOSIS — L97812 Non-pressure chronic ulcer of other part of right lower leg with fat layer exposed: Secondary | ICD-10-CM | POA: Diagnosis not present

## 2019-10-20 DIAGNOSIS — E11621 Type 2 diabetes mellitus with foot ulcer: Secondary | ICD-10-CM | POA: Diagnosis not present

## 2019-10-20 DIAGNOSIS — L97222 Non-pressure chronic ulcer of left calf with fat layer exposed: Secondary | ICD-10-CM | POA: Diagnosis not present

## 2019-10-20 DIAGNOSIS — I2510 Atherosclerotic heart disease of native coronary artery without angina pectoris: Secondary | ICD-10-CM | POA: Diagnosis not present

## 2019-10-20 DIAGNOSIS — E11622 Type 2 diabetes mellitus with other skin ulcer: Secondary | ICD-10-CM | POA: Diagnosis not present

## 2019-10-20 DIAGNOSIS — I509 Heart failure, unspecified: Secondary | ICD-10-CM | POA: Diagnosis not present

## 2019-10-20 DIAGNOSIS — J449 Chronic obstructive pulmonary disease, unspecified: Secondary | ICD-10-CM | POA: Diagnosis not present

## 2019-10-20 MED ORDER — BASAGLAR KWIKPEN 100 UNIT/ML ~~LOC~~ SOPN: PEN_INJECTOR | SUBCUTANEOUS | 2 refills | Status: DC

## 2019-10-20 NOTE — Progress Notes (Signed)
SOLON, ALBAN ([id number]) Visit Report for 10/20/2019 Arrival Information Details Patient Name: Adam Merritt, Adam Merritt Date of Service: 10/20/2019 1:15 PM Medical Record Number: [id number] Patient Account Number: [id number] Date of Birth/Sex: July 25, 1945 (74 y.o. M) Treating RN: Montey Hora Primary Care Climmie Buelow: [name] Other Clinician: Referring Kelis Plasse: [name] Treating Lauri Till/Extender: Tito Dine in Treatment: 3 Visit Information History Since Last Visit Added or deleted any medications: No Patient Arrived: Cane Any new allergies or adverse reactions: No Arrival Time: 13:16 Had a fall or experienced change in No Accompanied By: wife activities of daily living that may affect Transfer Assistance: None risk of falls: Patient Identification Verified: Yes Signs or symptoms of abuse/neglect since last visito No Secondary Verification Process Completed: Yes Hospitalized since last visit: No Patient Has Alerts: Yes Implantable device outside of the clinic excluding No Patient Alerts: Patient on Blood Thinner cellular tissue based products placed in the center DMII since last visit: Aspirin 325 Has Dressing in Place as Prescribed: Yes Has Compression in Place as Prescribed: Yes Pain Present Now: No Notes 98.8 Electronic Signature(s) Signed: 10/20/2019 4:30:43 PM By: Montey Hora Entered By: Montey Hora on 10/20/2019 13:17:29 Adam Merritt ([id number]) -------------------------------------------------------------------------------- Compression Therapy Details Patient Name: Adam Merritt Date of Service: 10/20/2019 1:15 PM Medical Record Number: [id number] Patient Account Number: [id number] Date of Birth/Sex: 1945-11-19 (74 y.o. M) Treating RN: Montey Hora Primary Care Quinlan Mcfall: [name] Other Clinician: Referring Gennette Shadix: [name] Treating Meaghann Choo/Extender: Tito Dine in Treatment: 3 Compression Therapy Performed for Wound  Assessment: Wound #32 Left,Lateral Lower Leg Performed By: Clinician Montey Hora, RN Compression Type: Four Layer Electronic Signature(s) Signed: 10/20/2019 1:54:12 PM By: Montey Hora Entered By: Montey Hora on 10/20/2019 13:54:11 Adam Merritt ([id number]) -------------------------------------------------------------------------------- Compression Therapy Details Patient Name: Adam Merritt Date of Service: 10/20/2019 1:15 PM Medical Record Number: [id number] Patient Account Number: [id number] Date of Birth/Sex: 20-Apr-1946 (74 y.o. M) Treating RN: Montey Hora Primary Care Eufelia Veno: [name] Other Clinician: Referring Vitalia Stough: [name] Treating Carnelia Oscar/Extender: Tito Dine in Treatment: 3 Compression Therapy Performed for Wound Assessment: Wound #33 Left,Medial Lower Leg Performed By: Clinician Montey Hora, RN Compression Type: Four Layer Electronic Signature(s) Signed: 10/20/2019 1:54:12 PM By: Montey Hora Entered By: Montey Hora on 10/20/2019 13:54:12 Adam Merritt ([id number]) -------------------------------------------------------------------------------- Encounter Discharge Information Details Patient Name: Adam Merritt Date of Service: 10/20/2019 1:15 PM Medical Record Number: [id number] Patient Account Number: [id number] Date of Birth/Sex: 09-07-1945 (74 y.o. M) Treating RN: Montey Hora Primary Care Kainalu Heggs: [name] Other Clinician: Referring Kejuan Bekker: [name] Treating Edge Mauger/Extender: Tito Dine in Treatment: 3 Encounter Discharge Information Items Discharge Condition: Stable Ambulatory Status: Cane Discharge Destination: Home Transportation: Private Auto Accompanied By: self Schedule Follow-up Appointment: Yes Clinical Summary of Care: Electronic Signature(s) Signed: 10/20/2019 1:55:41 PM By: Montey Hora Entered By: Montey Hora on 10/20/2019 13:55:41 Adam Merritt  ([id number]) -------------------------------------------------------------------------------- Wound Assessment Details Patient Name: Adam Merritt Date of Service: 10/20/2019 1:15 PM Medical Record Number: [id number] Patient Account Number: [id number] Date of Birth/Sex: [birth year] (74 y.o. M) Treating RN: Montey Hora Primary Care Brynlyn Dade: [name] Other Clinician: Referring Markeith Jue: [name] Treating Maelani Yarbro/Extender: Tito Dine in Treatment: 3 Wound Status Wound Number: 32 Primary Diabetic Wound/Ulcer of the Lower Extremity Etiology: Wound Location: Left, Lateral Lower Leg Wound Open Wounding Event: Blister Status: Date Acquired: 09/18/2019 Comorbid Anemia, Lymphedema, Chronic Obstructive Pulmonary Weeks Of Treatment: 3 History: Disease (COPD), Sleep Apnea, Congestive Heart Failure, Clustered  Wound: No Coronary Artery Disease, Hypertension, Peripheral Venous Disease, Type II Diabetes, Osteoarthritis, Neuropathy, Received Chemotherapy Photos Wound Measurements Length: (cm) 2.2 Width: (cm) 2.7 Depth: (cm) 0.1 Area: (cm) 4.665 Volume: (cm) 0.467 % Reduction in Area: 90.9% % Reduction in Volume: 90.9% Epithelialization: None Wound Description Classification: Grade 2 Exudate Amount: Medium Exudate Type: Serosanguineous Exudate Color: red, brown Foul Odor After Cleansing: No Slough/Fibrino Yes Wound Bed Granulation Amount: Medium (34-66%) Exposed Structure Granulation Quality: Red Fascia Exposed: No Necrotic Amount: Medium (34-66%) Fat Layer (Subcutaneous Tissue) Exposed: Yes Necrotic Quality: Adherent Slough Tendon Exposed: No Muscle Exposed: No Joint Exposed: No Bone Exposed: No Treatment Notes Wound #32 (Left, Lateral Lower Leg) 1. Cleansed with: Cleanse wound with antibacterial soap and water 3. Peri-wound Care: Moisturizing lotion JOVE, BEYL ([id number]) Notes scell, carboflex, abd, 4 layer bilateral with unna to  anchor Electronic Signature(s) Signed: 10/20/2019 4:30:43 PM By: Montey Hora Entered By: Montey Hora on 10/20/2019 13:39:07 Adam Merritt ([id number]) -------------------------------------------------------------------------------- Wound Assessment Details Patient Name: Adam Merritt Date of Service: 10/20/2019 1:15 PM Medical Record Number: [id number] Patient Account Number: [id number] Date of Birth/Sex: [birth year] (74 y.o. M) Treating RN: Montey Hora Primary Care Tyarra Nolton: [name] Other Clinician: Referring Kassady Laboy: [name] Treating Saamir Armstrong/Extender: Tito Dine in Treatment: 3 Wound Status Wound Number: 33 Primary Diabetic Wound/Ulcer of the Lower Extremity Etiology: Wound Location: Left, Medial Lower Leg Wound Open Wounding Event: Blister Status: Date Acquired: 09/18/2019 Comorbid Anemia, Lymphedema, Chronic Obstructive Pulmonary Weeks Of Treatment: 3 History: Disease (COPD), Sleep Apnea, Congestive Heart Failure, Clustered Wound: No Coronary Artery Disease, Hypertension, Peripheral Venous Disease, Type II Diabetes, Osteoarthritis, Neuropathy, Received Chemotherapy Photos Wound Measurements Length: (cm) 2.5 Width: (cm) 1.1 Depth: (cm) 0.2 Area: (cm) 2.16 Volume: (cm) 0.432 % Reduction in Area: 21.4% % Reduction in Volume: -57.1% Epithelialization: None Wound Description Classification: Grade 2 Wound Margin: Flat and Intact Exudate Amount: Medium Exudate Type: Serosanguineous Exudate Color: red, brown Foul Odor After Cleansing: No Slough/Fibrino Yes Wound Bed Granulation Amount: Medium (34-66%) Exposed Structure Granulation Quality: Red Fascia Exposed: No Necrotic Amount: Medium (34-66%) Fat Layer (Subcutaneous Tissue) Exposed: Yes Necrotic Quality: Adherent Slough Tendon Exposed: No Muscle Exposed: No Joint Exposed: No Bone Exposed: No Treatment Notes Wound #33 (Left, Medial Lower Leg) 1. Cleansed with: Cleanse wound  with antibacterial soap and water 3. Peri-wound Care: HUGHEY, RITTENBERRY ([id number]) Moisturizing lotion Notes scell, carboflex, abd, 4 layer bilateral with unna to anchor Electronic Signature(s) Signed: 10/20/2019 4:30:43 PM By: Montey Hora Entered By: Montey Hora on 10/20/2019 13:39:28

## 2019-10-27 ENCOUNTER — Encounter: Payer: Medicare PPO | Admitting: Physician Assistant

## 2019-10-27 ENCOUNTER — Other Ambulatory Visit: Payer: Self-pay

## 2019-10-27 DIAGNOSIS — I110 Hypertensive heart disease with heart failure: Secondary | ICD-10-CM | POA: Diagnosis not present

## 2019-10-27 DIAGNOSIS — I509 Heart failure, unspecified: Secondary | ICD-10-CM | POA: Diagnosis not present

## 2019-10-27 DIAGNOSIS — L97212 Non-pressure chronic ulcer of right calf with fat layer exposed: Secondary | ICD-10-CM | POA: Diagnosis not present

## 2019-10-27 DIAGNOSIS — L97812 Non-pressure chronic ulcer of other part of right lower leg with fat layer exposed: Secondary | ICD-10-CM | POA: Diagnosis not present

## 2019-10-27 DIAGNOSIS — E11622 Type 2 diabetes mellitus with other skin ulcer: Secondary | ICD-10-CM | POA: Diagnosis not present

## 2019-10-27 DIAGNOSIS — I2510 Atherosclerotic heart disease of native coronary artery without angina pectoris: Secondary | ICD-10-CM | POA: Diagnosis not present

## 2019-10-27 DIAGNOSIS — L97222 Non-pressure chronic ulcer of left calf with fat layer exposed: Secondary | ICD-10-CM | POA: Diagnosis not present

## 2019-10-27 DIAGNOSIS — I890 Lymphedema, not elsewhere classified: Secondary | ICD-10-CM | POA: Diagnosis not present

## 2019-10-27 DIAGNOSIS — J449 Chronic obstructive pulmonary disease, unspecified: Secondary | ICD-10-CM | POA: Diagnosis not present

## 2019-10-27 DIAGNOSIS — E11621 Type 2 diabetes mellitus with foot ulcer: Secondary | ICD-10-CM | POA: Diagnosis not present

## 2019-10-27 NOTE — Progress Notes (Addendum)
Adam Merritt, Adam Merritt ([id number]) Visit Report for 10/27/2019 Chief Complaint Document Details Patient Name: JAVION, HOLMER Date of Service: 10/27/2019 11:15 AM Medical Record Number: [id number] Patient Account Number: [id number] Date of Birth/Sex: [birth year] (74 y.o. M) Treating RN: Army Melia Primary Care Provider: [name] Other Clinician: Referring Provider: [name] Treating Provider/Extender: Melburn Hake, Alexcia Schools [name]: 4 Information Obtained from: Patient Chief Complaint Bilateral LE Ulcers and right second toe ulcer 09/26/2019; patient is here for review bilateral lower leg wounds. He is well familiar to this clinic Electronic Signature(s) Signed: 10/27/2019 11:02:39 AM By: Worthy Keeler PA-C Entered By: Worthy Keeler on 10/27/2019 11:02:38 Adam Merritt ([id number]) -------------------------------------------------------------------------------- HPI Details Patient Name: Adam Merritt Date of Service: 10/27/2019 11:15 AM Medical Record Number: [id number] Patient Account Number: [id number] Date of Birth/Sex: 26-Mar-1946 (74 y.o. M) Treating RN: Army Melia Primary Care Provider: [name] Other Clinician: Referring Provider: [name] Treating Provider/Extender: Melburn Hake, Avianna Moynahan [name]: 4 History of Present Illness HPI Description: 10/18/17-He is here for initial evaluation of bilateral lower extremity ulcerations in the presence of venous insufficiency and lymphedema. He has been seen by vascular medicine in the past, Dr. Lucky Cowboy, last seen in 2016. He does have a history of abnormal ABIs, which is to be expected given his lymphedema and venous insufficiency. According to Epic, it appears that all attempts for arterial evaluation and/or angiography were not follow through with by patient. He does have a history of being seen in lymphedema clinic in 2018, stopped going approximately 6 months ago stating "it didn't do any good". He does not have  lymphedema pumps, he does not have custom fit compression wrap/stockings. He is diabetic and his recent A1c last month was 7.6. He admits to chronic bilateral lower extremity pain, no change in pain since blister and ulceration development. He is currently being treated with Levaquin for bronchitis. He has home health and we will continue. 10/25/17-He is here in follow-up evaluation for bilateral lower extremity ulcerationssubtle he remains on Levaquin for bronchitis. Right lower extremity with no evidence of drainage or ulceration, persistent left lower extremity ulceration. He states that home health has not been out since his appointment. He went to [facility] on Tuesday, studies revealed: RIGHT ABI 0.9, TBI 0.6 LEFT ABI 1.1, TBI 0.6 with triphasic flow bilaterally. We will continue his same treatment plan. He has been educated on compression therapy and need for elevation. He will benefit from lymphedema pumps 11/01/17-He is here in follow-up evaluation for left lower extremity ulcer. The right lower extremity remains healed. He has home health services, but they have not been out to see the patient for 2-3 weeks. He states it home health physical therapy changed his dressing yesterday after therapy; he placed Ace wrap compression. We are still waiting for lymphedema pumps, reordered d/t need for company change. 11/08/17-He is here in follow-up evaluation for left lower extremity ulcer. It is improved. Edema is significantly improved with compression therapy. We will continue with same treatment plan and he will follow-up next week. No word regarding lymphedema pumps 11/15/17-He is here in follow-up evaluation for left lower extremity ulcer. He is healed and will be discharged from wound care services. I have reached out to [facility] regarding his lymphedema pumps. They have been unable to reach the patient; the contact number they had with the patient's wife's cell phone and  she has not answered any unrecognized calls. Contact should be made today, trial planned  for next week; [facility] will continue to follow 11/27/17 on evaluation today patient has multiple blistered areas over the right lower extremity his left lower extremity appears to be doing okay. These blistered areas show signs of no infection which is great news. With that being said he did have some necrotic skin overlying which was mechanically debrided away with saline and gauze today without complication. Overall post debridement the wounds appear to be doing better but in general his swelling seems to be increased. This is obviously not good news. I think this is what has given rise to the blisters. 12/04/17 on evaluation today patient presents for follow-up concerning his bilateral lower extremity edema in the right lower extremity ulcers. He has been tolerating the dressing changes without complication. With that being said he has had no real issues with the wraps which is also good news. Overall I'm pleased with the progress he's been making. 12/11/17 on evaluation today patient appears to be doing rather well in regard to his right lateral lower extremity ulcer. He's been tolerating the dressing changes without complication. Fortunately there does not appear to be any evidence of infection at this time. Overall I'm pleased with the progress that is being made. Unfortunately he has been in the hospital due to having what sounds to be a stomach virus/flu fortunately that is starting to get better. 12/18/17 on evaluation today patient actually appears to be doing very well in regard to his bilateral lower extremities the swelling is under fairly good control his lymphedema pumps are still not up and running quite yet. With that being said he does have several areas of opening noted as far as wounds are concerned mainly over the left lower extremity. With that being said I do believe once he gets  lymphedema pumps this would least help you mention some the fluid and preventing this from occurring. Hopefully that will be set up soon sleeves are Artie in place at his home he just waiting for the machine. 12/25/17 on evaluation today patient actually appears to be doing excellent in fact all of his ulcers appear to have resolved his legs appear very well. I do think he needs compression stockings we have discussed this and they are actually going to go to [facility] today to elastic therapy to get this fitted for him. I think that is definitely a good thing to do. Readmission: 04/09/18 upon evaluation today this patient is seen for readmission due to bilateral lower extremity lymphedema. He has significant swelling of his extremities especially on the left although the right is also swollen he has weeping from both sides. There are no obvious open wounds at this point. Fortunately he has been doing fairly well for quite a bit of time since I last saw him. Nonetheless unfortunately this seems to have reopened and is giving quite a bit of trouble. He states this began about a week ago when he first called [place] to get in to be seen. No fevers, chills, nausea, or vomiting noted at this time. He has not been using his lymphedema pumps due to the fact that they won't fit on his leg at this point likewise is also not been using his compression for essentially the same reason. 04/16/18 upon evaluation today patient actually appears to be doing a little better in regard to the fluid in his bilateral lower extremities. With that being said he's had three falls since I saw him last week. He also states that he's been feeling  very poorly. I was concerned last week and feel like that the concern is still there as far as the congestion in his chest is concerned he seems to be breathing about the same as last week but again he states he's very weak he's not even able to walk further than from the chair to the door.  His wife had to buy a wheelchair just to be able to get them out of the house to get to the appointment today. This has me very concerned. 04/23/18 on evaluation today patient actually appears to be doing much better than last week's evaluation. At that time actually had to transport him to the ER via EMS and he subsequently was admitted for acute pulmonary edema, acute renal failure, and acute congestive heart failure. Fortunately he is doing much better. Apparently they did dialyze him and were able to take off roughly 35 pounds of fluid. Nonetheless he is feeling much better both in regard to his breathing and he's able to get around much better at this time compared to previous. Overall I'm very VERE, DIANTONIO ([id number]) happy with how things are at this time. There does not appear to be any evidence of infection currently. No fevers, chills, nausea, or vomiting noted at this time. 04/30/2018 patient seen today for follow-up and management of bilateral lower extremity lymphedema. He did express being more sad today than usual due to the recent loss of his dog. He states that he has been compliant with using the lymphedema pumps. However he does admit a minute over the last 2-3 days he has not been using the pumps due to the recent loss of his dog. At this time there is no drainage or open wounds to his lower extremities. The left leg edema is measuring smaller today. Still has a significant amount of edema on bilateral lower extremities With dry flaky skin. He will be referred to the lymphedema clinic for further management. Will continue 3 layer compression wraps and follow-up in 1-2 weeks.Denies any pain, fever, chairs recently. No recent falls or injuries reported during this visit. 05/07/18 on evaluation today patient actually appears to be doing very well in regard to his lower extremities in general all things considered. With that being said he is having some pain in the legs just due to  the amount of swelling. He does have an area where he had a blister on the left lateral lower extremity this is open at this point other than that there's nothing else weeping at this time. 05/14/18 on evaluation today patient actually appears to be doing excellent all things considered in regard to his lower extremities. He still has a couple areas of weeping on each leg which has continued to be the issue for him. He does have an appointment with the lymphedema clinic although this isn't until February 2020. That was the earliest they had. In the meantime he has continued to tolerate the compression wraps without complication. 05/28/18 on evaluation today patient actually appears to be doing more poorly in regard to his left lower extremity where he has a wound open at this point. He also had a fall where he subsequently injured his right great toe which has led to an open wound at the site unfortunately. He has been tolerating the dressing changes without complication in general as far as the wraps are concerned that he has not been putting any dressing on the left 1st toe ulcer site. 06/11/18 on evaluation today patient appears to  be doing much worse in regard to his bilateral lower extremity ulcers. He has been tolerating the dressing changes without complication although his legs have not been wrapped more recently. Overall I am not very pleased with the way his legs appear. I do believe he needs to be back in compression wraps he still has not received his compression wraps from the [facility] hospital as of yet. 06/18/18 on evaluation today patient actually appears to be doing significantly better than last time I saw him. He has been tolerating the compression wraps without complication in the circumferential ulcers especially appear to be doing much better. His toe ulcer on the right in regard to the great toe is better although not as good as the legs in my pinion. No fevers chills noted 07/02/18 on  evaluation today patient appears to be doing much better in regard to his lower extremity ulcers. Unfortunately since I last saw him he's had the distal portion of his right great toe if he dated it sounds as if this actually went downhill very quickly. I had only seen him a few days prior and the toe did not appear to be infected at that point subsequently became infected very rapidly and it was decided by the surgeon that the distal portion of the toe needed to be removed. The patient seems to be doing well in this regard he tells me. With that being said his lower extremities are doing better from the standpoint of the wounds although he is significantly swollen at this point. 07/09/18 on evaluation today patient appears to be doing better in regard to the wounds on his lower extremities. In fact everything is almost completely healed he is just a small area on the left posterior lower extremity that is open at this point. 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 With that being said he needs some kind of compression it does sound like he may not be wearing his compression, that is the wraps, during the entire time between when he's here visit to visit. Apparently his wife took the current one off because it began to "fall apart". 07/16/18 on evaluation today patient appears to be extremely swollen especially in regard to his left lower extremity unfortunately. He also has a new skin tear over the left lower extremity and there's a smaller area on the right lower extremity as well. Unfortunately this seems to be due in part to blistering and fluid buildup in his leg. He did get the reduction wraps that were ordered by the [facility] hospital for him to go to lymphedema clinic. With that being said his wounds on the legs have  not healed to the point to where they would likely accept them as a patient lymphedema clinic currently. We need to try to get this to heal. With that being said he's been taking his wraps off which is not doing him any favors at this point. In fact this is probably quite counterproductive compared to what needs to occur. We will likely need to increase to a four layer compression wrap and continue to also utilize elevation and he has to keep the wraps on not take them off as he's been doing currently hasn't had a wrap on since Saturday. 07/23/18 on evaluation today patient appears to be doing much better in regard to  his bilateral lower extremities. In fact his left lower extremity which was the largest is actually 15 cm smaller today compared to what it was last time he was here in our clinic. This is obviously good news after just one week. Nonetheless the differences he actually kept the wraps on during the entire week this time. That's not typical for him. I do believe he understands a little bit better now the severity of the situation and why it's important for him to keep these wraps on. 07/30/18 on evaluation today patient actually appears to be doing rather well in regard to his lower extremities. His legs are much smaller than they have been in the past and he actually has only one very small rudder superficial region remaining that is not closed on the left lateral/posterior lower extremity even this is almost completely close. I do believe likely next week he will be healed without any complications. I do think we need to continue the wraps however this seems to be beneficial for him. I also think it may be a good time for [place] to go ahead and see about getting the appointment with the lymphedema clinic which is supposed to be made for him in order to keep this moving along and hopefully get them into compression wraps that will in the end help him to remain healed. 08/06/18 on evaluation today  patient actually appears to be doing very well in regard as bilateral lower extremities. In fact his wounds appear to be completely healed at this time. 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 He is in the process of getting appointment with the lymphedema clinic we have made this referral were just waiting to hear back on the schedule time. We need to follow up on that today as well. 08/13/18 on evaluation today patient actually appears to be doing very well in regard to his bilateral lower extremities there are no open wounds at this point. We are gonna go ahead and see about ordering the Velcro compression wraps for him had a discussion with them about [place] doing it versus the [place] they feel like they can definitely afford going ahead and get the wraps themselves and they would prefer to try to avoid having to go to the lymphedema clinic if it all possible which I completely understand. As long as he has good compression I'm okay either way. 3/17//20 on evaluation today patient actually appears to be doing well in regard to his bilateral lower extremity ulcers. He has been tolerating the dressing changes without complication specifically the compression wraps. Overall is had no issues my fingers finding that I see at this point is that he is having trouble with constipation. He tells me he has not been able to go to the bathroom for about six days. He's taken to over-the- counter oral laxatives unfortunately this is not helping. He has not contacted his doctor. Adam Merritt, Adam Merritt ([id number]) 08/27/18 on evaluation today patient appears to be doing fairly well in regard to his lower extremities at this point. There does not appear to be any new altars is swelling is very well controlled. We are still waiting for his Velcro compression wraps to arrive that should be sometime in the next week is Artie sent the check for these. 09/03/18 on  evaluation today patient appears to be doing excellent in regard to his bilateral lower extremities which he shows no signs of wound openings in regard to  this point. He does have his Velcro compression wraps which did arrive in the mail since I saw him last week. Overall he is doing excellent in my pinion. 09/13/18 on evaluation today patient appears to be doing very well currently in regard to the overall appearance of his bilateral lower extremities although he's a little bit more swollen than last time we saw him. At that point he been discharged without any open wounds. Nonetheless he has a small open wound on the posterior left lower extremity with some evidence of cellulitis noted as well. Fortunately I feel like he has made good progress overall with regard to his lower extremities from were things used to be. 09/20/18 on evaluation today patient actually appears to be doing much better. The erythematous lower extremity is improving wound itself which is still open appears to be doing much better as far as but appearance as well as pain is concerned overall very pleased in this regard. There's no signs of active infection at this time. 09/27/18 on evaluation today patient's wounds on the lower extremity actually appear to be doing fairly well at this time which is good news. There is no evidence of active infection currently and again is just as left lower extremity were there any wounds at this point anyway. I believe they may be completely healed but again I'm not 100% sure based on evaluation today. I think one more week of observation would likely be a good idea. 10/04/18 on evaluation today patient actually appears to be doing excellent in regard to the left lower extremity which actually appears to be completely healed as of today. Unfortunately he's been having issues with his right lower extremity have a new wound that has opened. Fortunately there's no evidence of active infection at this  time which is good news. 10/10/18 upon evaluation today patient actually appears to be doing a little bit worse with the new open area on his right posterior lower extremity. He's been tolerating the dressing changes without complication. Right now we been using his compression wraps although I think we may need to switch back to actually performing bilateral compression wraps are in the clinic. No fevers, chills, nausea, or vomiting noted at this time. 10/17/18 on evaluation today patient actually appears to be doing quite well in regard to his bilateral lower extremity ulcers. He has been tolerating the reps without complication although he would prefer not to rewrap his legs as of today. Fortunately there's no signs of active infection which is good news. No fevers, chills, nausea, or vomiting noted at this time. 10/24/18 on evaluation today patient appears to be doing very well in regard to his lower extremities. His right lower extremity is shown signs of healing and his left lower Trinity though not healed appears to be improving which is excellent news. Overall very pleased with how things seem to be progressing at this time. The patient is likewise happy to hear this. His Velcro compression wraps however have not been put on properly will gonna show his wife how to do that properly today. 10/31/18 on evaluation today patient appears to be doing more poorly in regard to his left lower extremity in particular although both lower extremities actually are showing some signs of being worse than my previous evaluation. Unfortunately I'm just not sure that his compression stockings even with the use of the compression/lymphedema pumps seem to be controlling this well. Upon further questioning he tells me that he also is not able to  lie flat in the bed due to his congestive heart failure and difficulty breathing. For that reason he sleeps in his recliner. To make matters worse his recliner also cannot even  hold his legs up so instead of even being somewhat elevated they pretty much hang down to the floor. This is the way he sleeps each night which is definitely counterproductive to everything else that were attempting to do from the standpoint of controlling his fluid. Nonetheless I think that he potentially could benefit from a hospital bed although this would be something that his primary care provider would likely have to order since anything that is order on our side has to be directly related to wound care and again the hospital bed is not necessarily a direct relation to although I think it does contribute to his overall wound status on his lower extremities. 11/07/18 on evaluation today patient appears to be doing better in regard to his bilateral lower extremity. He's been tolerating the dressing changes without complication. We did in the interim since I last saw him switch to just using extras orbiting new alginate and that seems to have done much better for him. I'm very pleased with the overall progress that is made. 11/14/18 on evaluation today patient appears to be redoing rather well in regard to his left lower extremity ulcers which are the only ones remaining at this point. Fortunately there's no signs of active infection at this time which is good news. Overall been very pleased with how things seem to be progressing currently. No fevers, chills, nausea, or vomiting noted at this time. 11/20/17 on evaluation today patient actually appears to be doing quite well in regard to his lower extremities on the left on the right he has several blisters that showed up although there's some question about whether or not he has had his broker compression wraps on like he was supposed to or not. Fortunately there's no signs of active infection at this time which is good news. Unfortunately though he is doing well from the standpoint of the left leg the right leg is not doing as well again this is when  we did not wrap last week. 11/28/18 on evaluation today patient appears to be doing well in regard to his bilateral lower extremities is left appears to be healed is right is not healed but is very close to being so. Overall very pleased with how things seem to be progressing. Patient is likewise happy that things are doing well. 12/09/18 on evaluation today patient actually appears to be completely healed which is excellent news. He actually seems to be doing well in regard to the swelling of the bilateral lower extremities which is also great news. Overall very pleased with how things seem to be progressing. Readmission: 03/18/2019 upon evaluation today patient presents for reevaluation concerning issues that he is having with his left lower extremity where he does have a wound noted upon inspection today. He also has a wound on the right second toe on the tip where it is apparently been rubbing in his shoe I did look at issues and you can actually see where this has been occurring as well. Fortunately there is no signs of infection with regard to the toe necessarily although it is very swollen compared to normal that does have me somewhat concerned about the possibility of further evaluation for infection/osteomyelitis. I would recommend an x-ray to start with. Otherwise he states that it is been 1-2 weeks that he has  had the draining in regard to left lower extremity he does not know how this happened he has been wearing his compression appropriately and I do see that as well today but nonetheless I do think that he needs to continue to be very cautious with regard to elevation as well. Adam Merritt, Adam Merritt ([id number]) 03/25/2019 on evaluation today patient appears to be doing much better in regard to his lower extremity at this time. That is on the left. He did culture positive for Pseudomonas but the good news is he seems to be doing much better the gentamicin we applied topically seems to have done a  great job for him. There is no signs of active infection at this time systemically and locally he is doing much better. No fevers, chills, nausea, vomiting, or diarrhea. In regard to his right toe this seems to be doing much better there is very little pressure noted at this point I did clean up some of the callus today around the edges of the wound as well as the surface of the wound but to be honest he is progressing quite nicely. 10/30; the area on the left anterior lower tibia area is healed over. His edema control is adequate even though his use of the compression pumps seems very intermittent. He has a juxta lite stocking on the right leg and a think there is 1 for the left leg at home. His wife is putting these on. He has an area on the plantar right second toe which is a hammertoe they are trying to offload this 04/08/2019 on evaluation today patient appears to be doing well in regard to his lower extremities which is good news. We are seeing him today for his toe ulcer which is giving him a little bit more trouble but again still seems to be doing better at this point which is good news. He is going require some sharp debridement in regard to the toe today however. 04/15/2019 on evaluation today patient actually appears to be doing quite well with regard to his toe ulcer. I am very pleased with how things are doing in that regard. With regard to his lower extremity edema in general he tells me he is not been using the lymphedema pumps regularly although he does not use them. I think that he needs to use them more regularly he does have a small blister on the left lower extremity this is not open at this point but obviously it something that can get worse if he does not keep the compression going and the pumps going as well. 04/22/2019 on evaluation today patient appears to be doing well with regard to his toe ulcer. He has been tolerating the dressing changes without complication. This is  measuring slightly smaller this week as compared to last week. Fortunately there is no signs of active infection at this time. 05/06/2019 on evaluation today patient actually appears to be doing excellent. In fact his toe appears to be completely healed which is great news. There is no signs of active infection at this time. Readmission: Patient presents today for follow-up after having been discharged at the beginning of December 2020. He states that in recent weeks things have reopened and has been having more trouble at this point. With that being said fortunately there is no signs of active infection at this time. No fever chills noted. Nonetheless he also does have an issue with one of his toes as well that seems to be somewhat this is  on the right foot second toe. 07/25/2019 upon evaluation today patient's lower extremities appear to be doing excellent bilaterally. I really feel like he is showing signs of great improvement and in fact I think he is close to healing which is great news. There is no signs of active infection at this time. No fevers, chills, nausea, vomiting, or diarrhea. 07/31/2019 upon evaluation today patient appears to be doing excellent and in fact I think may be completely healed in regard to his right lower extremity that were still to monitor this 1 more week before closing it out. The left lower extremity is measuring smaller although not completely closed seems to be doing excellent. 08/07/2019 upon evaluation today patient appears to be doing excellent in regard to his wounds. In fact the right lower extremity is completely healed there is no issues here. The left lower extremity has 1 very small area still remaining fortunately there is no signs of infection and overall I think he is very close to closure of the here as well. 08/14/19 upon evaluation today patient appears to be doing excellent in regard to his lower extremities. In fact he appears to be completely  healed as of today. Fortunately there's no signs of active infection at this time. No fevers, chills, nausea, or vomiting noted at this time. READMISSION 09/26/2019 This is a 74 year old man who is well-known to this clinic having just been discharged on 08/14/2019. He has known lymphedema and chronic venous insufficiency. He has Farrow wrap stockings and also external compression pumps. He does not use the external compression pumps however. He does however apparently fairly faithfully use the stockings. They have not been lotion in his legs. He has developed new wounds on the right anterior as well as the left medial left lateral and left posterior calf. He returns for our review of this Past medical history includes coronary artery disease, hypertension, congestive heart failure, COPD, venous insufficiency with lymphedema, type 2 diabetes. He apparently has compression pumps, Farrow wraps and at 1 point a hospital bed although I believe he sleeps in a recliner 10/03/2019 upon evaluation today patient appears to be doing better with regard to his wounds in general. He has been tolerating the dressing changes without complication. Fortunately there is no signs of active infection. No fevers, chills, nausea, vomiting, or diarrhea. I do believe the compression wraps are helping as they always have in the past. 10/10/2019 upon evaluation today patient appears to be doing excellent at this point. His right lower extremity is measuring much better and in fact is completely healed. His left lower extremity is also measuring better though not healed seems to be doing excellent at this point which is great news. Overall I am very pleased with how things appear currently. 10/27/2019 upon evaluation today patient appears to be doing quite well with regard to his wounds currently. He has been tolerating the dressing changes without complication. Fortunately there is no signs of active infection at this time. In  fact he is almost completely healed on the left and has just a very small area open and on the right he is completely healed. Electronic Signature(s) Signed: 10/27/2019 12:21:42 PM By: Worthy Keeler PA-C Entered By: Worthy Keeler on 10/27/2019 12:21:41 Adam Merritt ([id number]) -------------------------------------------------------------------------------- Physical Exam Details Patient Name: Adam Merritt Date of Service: 10/27/2019 11:15 AM Medical Record Number: [id number] Patient Account Number: [id number] Date of Birth/Sex: [birth year] (74 y.o. M) Treating RN: Army Melia Primary Care Provider: [name]  Other Clinician: Referring Provider: [name] Treating Provider/Extender: STONE III, Jurell Basista [name]: 4 Constitutional Well-nourished and well-hydrated in no acute distress. Respiratory normal breathing without difficulty. Psychiatric this patient is able to make decisions and demonstrates good insight into disease process. Alert and Oriented x 3. pleasant and cooperative. Notes Upon inspection patient's wound bed actually showed signs of good granulation at this time on the left lower extremity. Overall very pleased with how things seem to be progressing. Fortunately there is no signs of active infection at this time which is also good news. Electronic Signature(s) Signed: 10/27/2019 12:22:00 PM By: Worthy Keeler PA-C Entered By: Worthy Keeler on 10/27/2019 12:21:59 Adam Merritt ([id number]) -------------------------------------------------------------------------------- Physician Orders Details Patient Name: Adam Merritt Date of Service: 10/27/2019 11:15 AM Medical Record Number: [id number] Patient Account Number: [id number] Date of Birth/Sex: [birth year] (74 y.o. M) Treating RN: Army Melia Primary Care Provider: [name] Other Clinician: Referring Provider: [name] Treating Provider/Extender: Melburn Hake, Adriann Ballweg [name]: 4 Verbal /  Phone Orders: No Diagnosis Coding ICD-10 Coding Code Description I89.0 Lymphedema, not elsewhere classified L97.222 Non-pressure chronic ulcer of left calf with fat layer exposed L97.812 Non-pressure chronic ulcer of other part of right lower leg with fat layer exposed Wound Cleansing Wound #32 Left,Lateral Lower Leg o Clean wound with Normal Saline. o May shower with protection. - Please do not get your wraps wet Skin Barriers/Peri-Wound Care o Moisturizing lotion - to both lower legs Primary Wound Dressing Wound #32 Left,Lateral Lower Leg o Silver Alginate Secondary Dressing Wound #32 Left,Lateral Lower Leg o ABD pad Dressing Change Frequency o Change dressing every week - Please call for nurse visit if your wraps start to slide down your legs Follow-up Appointments o Return Appointment in 1 week. o Nurse Visit as needed - Please call for nurse visit if your wraps start to slide down your legs Edema Control Wound #32 Left,Lateral Lower Leg o 4-Layer Compression System - Left Lower Extremity. o Elevate legs to the level of the heart and pump ankles as often as possible o Compression Pump: Use compression pump on left lower extremity for 60 minutes, twice daily. o Compression Pump: Use compression pump on right lower extremity for 60 minutes, twice daily. Electronic Signature(s) Signed: 10/27/2019 12:57:56 PM By: Army Melia Signed: 10/27/2019 1:29:46 PM By: Worthy Keeler PA-C Entered By: Army Melia on 10/27/2019 12:18:11 Adam Merritt ([id number]) -------------------------------------------------------------------------------- Problem List Details Patient Name: Adam Merritt Date of Service: 10/27/2019 11:15 AM Medical Record Number: [id number] Patient Account Number: [id number] Date of Birth/Sex: 01/04/1946 (74 y.o. M) Treating RN: Army Melia Primary Care Provider: [name] Other Clinician: Referring Provider: [name] Treating  Provider/Extender: Melburn Hake, Marlo Arriola [name]: 4 Active Problems ICD-10 Encounter Code Description Active Date MDM Diagnosis I89.0 Lymphedema, not elsewhere classified 09/26/2019 No Yes L97.222 Non-pressure chronic ulcer of left calf with fat layer exposed 09/26/2019 No Yes L97.812 Non-pressure chronic ulcer of other part of right lower leg with fat layer 09/26/2019 No Yes exposed Inactive Problems Resolved Problems Electronic Signature(s) Signed: 10/27/2019 11:02:27 AM By: Worthy Keeler PA-C Entered By: Worthy Keeler on 10/27/2019 11:02:27 Adam Merritt ([id number]) -------------------------------------------------------------------------------- Progress Note Details Patient Name: Adam Merritt Date of Service: 10/27/2019 11:15 AM Medical Record Number: [id number] Patient Account Number: [id number] Date of Birth/Sex: [birth year] (74 y.o. M) Treating RN: Army Melia Primary Care Provider: [name] Other Clinician: Referring Provider: [name] Treating Provider/Extender: STONE III, Jacque Byron Weeks in  Treatment: 4 Subjective Chief Complaint Information obtained from Patient Bilateral LE Ulcers and right second toe ulcer 09/26/2019; patient is here for review bilateral lower leg wounds. He is well familiar to this clinic History of Present Illness (HPI) 10/18/17-He is here for initial evaluation of bilateral lower extremity ulcerations in the presence of venous insufficiency and lymphedema. He has been seen by vascular medicine in the past, Dr. Lucky Cowboy, last seen in 2016. He does have a history of abnormal ABIs, which is to be expected given his lymphedema and venous insufficiency. According to Epic, it appears that all attempts for arterial evaluation and/or angiography were not follow through with by patient. He does have a history of being seen in lymphedema clinic in 2018, stopped going approximately 6 months ago stating "it didn't do any good". He does not have lymphedema  pumps, he does not have custom fit compression wrap/stockings. He is diabetic and his recent A1c last month was 7.6. He admits to chronic bilateral lower extremity pain, no change in pain since blister and ulceration development. He is currently being treated with Levaquin for bronchitis. He has home health and we will continue. 10/25/17-He is here in follow-up evaluation for bilateral lower extremity ulcerationssubtle he remains on Levaquin for bronchitis. Right lower extremity with no evidence of drainage or ulceration, persistent left lower extremity ulceration. He states that home health has not been out since his appointment. He went to [facility] on Tuesday, studies revealed: RIGHT ABI 0.9, TBI 0.6 LEFT ABI 1.1, TBI 0.6 with triphasic flow bilaterally. We will continue his same treatment plan. He has been educated on compression therapy and need for elevation. He will benefit from lymphedema pumps 11/01/17-He is here in follow-up evaluation for left lower extremity ulcer. The right lower extremity remains healed. He has home health services, but they have not been out to see the patient for 2-3 weeks. He states it home health physical therapy changed his dressing yesterday after therapy; he placed Ace wrap compression. We are still waiting for lymphedema pumps, reordered d/t need for company change. 11/08/17-He is here in follow-up evaluation for left lower extremity ulcer. It is improved. Edema is significantly improved with compression therapy. We will continue with same treatment plan and he will follow-up next week. No word regarding lymphedema pumps 11/15/17-He is here in follow-up evaluation for left lower extremity ulcer. He is healed and will be discharged from wound care services. I have reached out to [facility] regarding his lymphedema pumps. They have been unable to reach the patient; the contact number they had with the patient's wife's cell phone and she has not  answered any unrecognized calls. Contact should be made today, trial planned for next week; [facility] will continue to follow 11/27/17 on evaluation today patient has multiple blistered areas over the right lower extremity his left lower extremity appears to be doing okay. These blistered areas show signs of no infection which is great news. With that being said he did have some necrotic skin overlying which was mechanically debrided away with saline and gauze today without complication. Overall post debridement the wounds appear to be doing better but in general his swelling seems to be increased. This is obviously not good news. I think this is what has given rise to the blisters. 12/04/17 on evaluation today patient presents for follow-up concerning his bilateral lower extremity edema in the right lower extremity ulcers. He has been tolerating the dressing changes without complication. With that being said  he has had no real issues with the wraps which is also good news. Overall I'm pleased with the progress he's been making. 12/11/17 on evaluation today patient appears to be doing rather well in regard to his right lateral lower extremity ulcer. He's been tolerating the dressing changes without complication. Fortunately there does not appear to be any evidence of infection at this time. Overall I'm pleased with the progress that is being made. Unfortunately he has been in the hospital due to having what sounds to be a stomach virus/flu fortunately that is starting to get better. 12/18/17 on evaluation today patient actually appears to be doing very well in regard to his bilateral lower extremities the swelling is under fairly good control his lymphedema pumps are still not up and running quite yet. With that being said he does have several areas of opening noted as far as wounds are concerned mainly over the left lower extremity. With that being said I do believe once he gets lymphedema pumps  this would least help you mention some the fluid and preventing this from occurring. Hopefully that will be set up soon sleeves are Artie in place at his home he just waiting for the machine. 12/25/17 on evaluation today patient actually appears to be doing excellent in fact all of his ulcers appear to have resolved his legs appear very well. I do think he needs compression stockings we have discussed this and they are actually going to go to [facility] today to elastic therapy to get this fitted for him. I think that is definitely a good thing to do. Readmission: 04/09/18 upon evaluation today this patient is seen for readmission due to bilateral lower extremity lymphedema. He has significant swelling of his extremities especially on the left although the right is also swollen he has weeping from both sides. There are no obvious open wounds at this point. Fortunately he has been doing fairly well for quite a bit of time since I last saw him. Nonetheless unfortunately this seems to have reopened and is giving quite a bit of trouble. He states this began about a week ago when he first called [place] to get in to be seen. No fevers, chills, nausea, or vomiting noted at this time. He has not been using his lymphedema pumps due to the fact that they won't fit on his leg at this point likewise is also not been using his compression for essentially the same reason. 04/16/18 upon evaluation today patient actually appears to be doing a little better in regard to the fluid in his bilateral lower extremities. With that being said he's had three falls since I saw him last week. He also states that he's been feeling very poorly. I was concerned last week and feel like that the concern is still there as far as the congestion in his chest is concerned he seems to be breathing about the same as last week but again he states he's very weak he's not even able to walk further than from the chair to the door. His wife had to  buy a wheelchair just to be able to get them out of the house to get to the appointment today. This has me very concerned. Adam Merritt, Adam Merritt ([id number]) 04/23/18 on evaluation today patient actually appears to be doing much better than last week's evaluation. At that time actually had to transport him to the ER via EMS and he subsequently was admitted for acute pulmonary edema, acute renal failure, and acute congestive  heart failure. Fortunately he is doing much better. Apparently they did dialyze him and were able to take off roughly 35 pounds of fluid. Nonetheless he is feeling much better both in regard to his breathing and he's able to get around much better at this time compared to previous. Overall I'm very happy with how things are at this time. There does not appear to be any evidence of infection currently. No fevers, chills, nausea, or vomiting noted at this time. 04/30/2018 patient seen today for follow-up and management of bilateral lower extremity lymphedema. He did express being more sad today than usual due to the recent loss of his dog. He states that he has been compliant with using the lymphedema pumps. However he does admit a minute over the last 2-3 days he has not been using the pumps due to the recent loss of his dog. At this time there is no drainage or open wounds to his lower extremities. The left leg edema is measuring smaller today. Still has a significant amount of edema on bilateral lower extremities With dry flaky skin. He will be referred to the lymphedema clinic for further management. Will continue 3 layer compression wraps and follow-up in 1-2 weeks.Denies any pain, fever, chairs recently. No recent falls or injuries reported during this visit. 05/07/18 on evaluation today patient actually appears to be doing very well in regard to his lower extremities in general all things considered. With that being said he is having some pain in the legs just due to the amount of  swelling. He does have an area where he had a blister on the left lateral lower extremity this is open at this point other than that there's nothing else weeping at this time. 05/14/18 on evaluation today patient actually appears to be doing excellent all things considered in regard to his lower extremities. He still has a couple areas of weeping on each leg which has continued to be the issue for him. He does have an appointment with the lymphedema clinic although this isn't until February 2020. That was the earliest they had. In the meantime he has continued to tolerate the compression wraps without complication. 05/28/18 on evaluation today patient actually appears to be doing more poorly in regard to his left lower extremity where he has a wound open at this point. He also had a fall where he subsequently injured his right great toe which has led to an open wound at the site unfortunately. He has been tolerating the dressing changes without complication in general as far as the wraps are concerned that he has not been putting any dressing on the left 1st toe ulcer site. 06/11/18 on evaluation today patient appears to be doing much worse in regard to his bilateral lower extremity ulcers. He has been tolerating the dressing changes without complication although his legs have not been wrapped more recently. Overall I am not very pleased with the way his legs appear. I do believe he needs to be back in compression wraps he still has not received his compression wraps from the [facility] hospital as of yet. 06/18/18 on evaluation today patient actually appears to be doing significantly better than last time I saw him. He has been tolerating the compression wraps without complication in the circumferential ulcers especially appear to be doing much better. His toe ulcer on the right in regard to the great toe is better although not as good as the legs in my pinion. No fevers chills noted 07/02/18 on  evaluation  today patient appears to be doing much better in regard to his lower extremity ulcers. Unfortunately since I last saw him he's had the distal portion of his right great toe if he dated it sounds as if this actually went downhill very quickly. I had only seen him a few days prior and the toe did not appear to be infected at that point subsequently became infected very rapidly and it was decided by the surgeon that the distal portion of the toe needed to be removed. The patient seems to be doing well in this regard he tells me. With that being said his lower extremities are doing better from the standpoint of the wounds although he is significantly swollen at this point. 07/09/18 on evaluation today patient appears to be doing better in regard to the wounds on his lower extremities. In fact everything is almost completely healed he is just a small area on the left posterior lower extremity that is open at this point. 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 With that being said he needs some kind of compression it does sound like he may not be wearing his compression, that is the wraps, during the entire time between when he's here visit to visit. Apparently his wife took the current one off because it began to "fall apart". 07/16/18 on evaluation today patient appears to be extremely swollen especially in regard to his left lower extremity unfortunately. He also has a new skin tear over the left lower extremity and there's a smaller area on the right lower extremity as well. Unfortunately this seems to be due in part to blistering and fluid buildup in his leg. He did get the reduction wraps that were ordered by the [facility] hospital for him to go to lymphedema clinic. With that being said his wounds on the legs have not healed  to the point to where they would likely accept them as a patient lymphedema clinic currently. We need to try to get this to heal. With that being said he's been taking his wraps off which is not doing him any favors at this point. In fact this is probably quite counterproductive compared to what needs to occur. We will likely need to increase to a four layer compression wrap and continue to also utilize elevation and he has to keep the wraps on not take them off as he's been doing currently hasn't had a wrap on since Saturday. 07/23/18 on evaluation today patient appears to be doing much better in regard to his bilateral lower extremities. In fact his left lower extremity which was the largest is actually 15 cm smaller today compared to what it was last time he was here in our clinic. This is obviously good news after just one week. Nonetheless the differences he actually kept the wraps on during the entire week this time. That's not typical for him. I do believe he understands a little bit better now the severity of the situation and why it's important for him to keep these wraps on. 07/30/18 on evaluation today patient actually appears to be doing rather well in regard to his lower extremities. His legs are much smaller than they have been in the past and he actually has only one very small rudder superficial region remaining that is not closed on the  left lateral/posterior lower extremity even this is almost completely close. I do believe likely next week he will be healed without any complications. I do think we need to continue the wraps however this seems to be beneficial for him. I also think it may be a good time for [place] to go ahead and see about getting the appointment with the lymphedema clinic which is supposed to be made for him in order to keep this moving along and hopefully get them into compression wraps that will in the end help him to remain healed. 08/06/18 on evaluation today patient  actually appears to be doing very well in regard as bilateral lower extremities. In fact his wounds appear to be completely healed at this time. 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 He is in the process of getting appointment with the lymphedema clinic we have made this referral were just waiting to hear back on the schedule time. We need to follow up on that today as well. 08/13/18 on evaluation today patient actually appears to be doing very well in regard to his bilateral lower extremities there are no open wounds at this point. We are gonna go ahead and see about ordering the Velcro compression wraps for him had a discussion with them about [place] doing it versus the [place] they feel like they can definitely afford going ahead and get the wraps themselves and they would prefer to try to avoid having to go to the lymphedema clinic if it all possible which I completely understand. As long as he has good compression I'm okay either way. Adam Merritt, Adam Merritt ([id number]) 3/17//20 on evaluation today patient actually appears to be doing well in regard to his bilateral lower extremity ulcers. He has been tolerating the dressing changes without complication specifically the compression wraps. Overall is had no issues my fingers finding that I see at this point is that he is having trouble with constipation. He tells me he has not been able to go to the bathroom for about six days. He's taken to over-the- counter oral laxatives unfortunately this is not helping. He has not contacted his doctor. 08/27/18 on evaluation today patient appears to be doing fairly well in regard to his lower extremities at this point. There does not appear to be any new altars is swelling is very well controlled. We are still waiting for his Velcro compression wraps to arrive that should be sometime in the next week is Artie sent the check for these. 09/03/18 on evaluation today  patient appears to be doing excellent in regard to his bilateral lower extremities which he shows no signs of wound openings in regard to this point. He does have his Velcro compression wraps which did arrive in the mail since I saw him last week. Overall he is doing excellent in my pinion. 09/13/18 on evaluation today patient appears to be doing very well currently in regard to the overall appearance of his bilateral lower extremities although he's a little bit more swollen than last time we saw him. At that point he been discharged without any open wounds. Nonetheless he has a small open wound on the posterior left lower extremity with some evidence of cellulitis noted as well. Fortunately I feel like he has made good progress overall with regard to his lower extremities from were things used to be. 09/20/18 on evaluation today patient actually appears to be doing much better. The erythematous lower extremity is improving  wound itself which is still open appears to be doing much better as far as but appearance as well as pain is concerned overall very pleased in this regard. There's no signs of active infection at this time. 09/27/18 on evaluation today patient's wounds on the lower extremity actually appear to be doing fairly well at this time which is good news. There is no evidence of active infection currently and again is just as left lower extremity were there any wounds at this point anyway. I believe they may be completely healed but again I'm not 100% sure based on evaluation today. I think one more week of observation would likely be a good idea. 10/04/18 on evaluation today patient actually appears to be doing excellent in regard to the left lower extremity which actually appears to be completely healed as of today. Unfortunately he's been having issues with his right lower extremity have a new wound that has opened. Fortunately there's no evidence of active infection at this time which is  good news. 10/10/18 upon evaluation today patient actually appears to be doing a little bit worse with the new open area on his right posterior lower extremity. He's been tolerating the dressing changes without complication. Right now we been using his compression wraps although I think we may need to switch back to actually performing bilateral compression wraps are in the clinic. No fevers, chills, nausea, or vomiting noted at this time. 10/17/18 on evaluation today patient actually appears to be doing quite well in regard to his bilateral lower extremity ulcers. He has been tolerating the reps without complication although he would prefer not to rewrap his legs as of today. Fortunately there's no signs of active infection which is good news. No fevers, chills, nausea, or vomiting noted at this time. 10/24/18 on evaluation today patient appears to be doing very well in regard to his lower extremities. His right lower extremity is shown signs of healing and his left lower Trinity though not healed appears to be improving which is excellent news. Overall very pleased with how things seem to be progressing at this time. The patient is likewise happy to hear this. His Velcro compression wraps however have not been put on properly will gonna show his wife how to do that properly today. 10/31/18 on evaluation today patient appears to be doing more poorly in regard to his left lower extremity in particular although both lower extremities actually are showing some signs of being worse than my previous evaluation. Unfortunately I'm just not sure that his compression stockings even with the use of the compression/lymphedema pumps seem to be controlling this well. Upon further questioning he tells me that he also is not able to lie flat in the bed due to his congestive heart failure and difficulty breathing. For that reason he sleeps in his recliner. To make matters worse his recliner also cannot even hold his legs  up so instead of even being somewhat elevated they pretty much hang down to the floor. This is the way he sleeps each night which is definitely counterproductive to everything else that were attempting to do from the standpoint of controlling his fluid. Nonetheless I think that he potentially could benefit from a hospital bed although this would be something that his primary care provider would likely have to order since anything that is order on our side has to be directly related to wound care and again the hospital bed is not necessarily a direct relation to although I think  it does contribute to his overall wound status on his lower extremities. 11/07/18 on evaluation today patient appears to be doing better in regard to his bilateral lower extremity. He's been tolerating the dressing changes without complication. We did in the interim since I last saw him switch to just using extras orbiting new alginate and that seems to have done much better for him. I'm very pleased with the overall progress that is made. 11/14/18 on evaluation today patient appears to be redoing rather well in regard to his left lower extremity ulcers which are the only ones remaining at this point. Fortunately there's no signs of active infection at this time which is good news. Overall been very pleased with how things seem to be progressing currently. No fevers, chills, nausea, or vomiting noted at this time. 11/20/17 on evaluation today patient actually appears to be doing quite well in regard to his lower extremities on the left on the right he has several blisters that showed up although there's some question about whether or not he has had his broker compression wraps on like he was supposed to or not. Fortunately there's no signs of active infection at this time which is good news. Unfortunately though he is doing well from the standpoint of the left leg the right leg is not doing as well again this is when we did not wrap  last week. 11/28/18 on evaluation today patient appears to be doing well in regard to his bilateral lower extremities is left appears to be healed is right is not healed but is very close to being so. Overall very pleased with how things seem to be progressing. Patient is likewise happy that things are doing well. 12/09/18 on evaluation today patient actually appears to be completely healed which is excellent news. He actually seems to be doing well in regard to the swelling of the bilateral lower extremities which is also great news. Overall very pleased with how things seem to be progressing. Readmission: 03/18/2019 upon evaluation today patient presents for reevaluation concerning issues that he is having with his left lower extremity where he does have a wound noted upon inspection today. He also has a wound on the right second toe on the tip where it is apparently been rubbing in his shoe I did look at issues and you can actually see where this has been occurring as well. Fortunately there is no signs of infection with regard to Adam Merritt, Adam Merritt. ([id number]) the toe necessarily although it is very swollen compared to normal that does have me somewhat concerned about the possibility of further evaluation for infection/osteomyelitis. I would recommend an x-ray to start with. Otherwise he states that it is been 1-2 weeks that he has had the draining in regard to left lower extremity he does not know how this happened he has been wearing his compression appropriately and I do see that as well today but nonetheless I do think that he needs to continue to be very cautious with regard to elevation as well. 03/25/2019 on evaluation today patient appears to be doing much better in regard to his lower extremity at this time. That is on the left. He did culture positive for Pseudomonas but the good news is he seems to be doing much better the gentamicin we applied topically seems to have done a great job for  him. There is no signs of active infection at this time systemically and locally he is doing much better. No fevers, chills, nausea, vomiting,  or diarrhea. In regard to his right toe this seems to be doing much better there is very little pressure noted at this point I did clean up some of the callus today around the edges of the wound as well as the surface of the wound but to be honest he is progressing quite nicely. 10/30; the area on the left anterior lower tibia area is healed over. His edema control is adequate even though his use of the compression pumps seems very intermittent. He has a juxta lite stocking on the right leg and a think there is 1 for the left leg at home. His wife is putting these on. He has an area on the plantar right second toe which is a hammertoe they are trying to offload this 04/08/2019 on evaluation today patient appears to be doing well in regard to his lower extremities which is good news. We are seeing him today for his toe ulcer which is giving him a little bit more trouble but again still seems to be doing better at this point which is good news. He is going require some sharp debridement in regard to the toe today however. 04/15/2019 on evaluation today patient actually appears to be doing quite well with regard to his toe ulcer. I am very pleased with how things are doing in that regard. With regard to his lower extremity edema in general he tells me he is not been using the lymphedema pumps regularly although he does not use them. I think that he needs to use them more regularly he does have a small blister on the left lower extremity this is not open at this point but obviously it something that can get worse if he does not keep the compression going and the pumps going as well. 04/22/2019 on evaluation today patient appears to be doing well with regard to his toe ulcer. He has been tolerating the dressing changes without complication. This is measuring slightly  smaller this week as compared to last week. Fortunately there is no signs of active infection at this time. 05/06/2019 on evaluation today patient actually appears to be doing excellent. In fact his toe appears to be completely healed which is great news. There is no signs of active infection at this time. Readmission: Patient presents today for follow-up after having been discharged at the beginning of December 2020. He states that in recent weeks things have reopened and has been having more trouble at this point. With that being said fortunately there is no signs of active infection at this time. No fever chills noted. Nonetheless he also does have an issue with one of his toes as well that seems to be somewhat this is on the right foot second toe. 07/25/2019 upon evaluation today patient's lower extremities appear to be doing excellent bilaterally. I really feel like he is showing signs of great improvement and in fact I think he is close to healing which is great news. There is no signs of active infection at this time. No fevers, chills, nausea, vomiting, or diarrhea. 07/31/2019 upon evaluation today patient appears to be doing excellent and in fact I think may be completely healed in regard to his right lower extremity that were still to monitor this 1 more week before closing it out. The left lower extremity is measuring smaller although not completely closed seems to be doing excellent. 08/07/2019 upon evaluation today patient appears to be doing excellent in regard to his wounds. In fact the right lower  extremity is completely healed there is no issues here. The left lower extremity has 1 very small area still remaining fortunately there is no signs of infection and overall I think he is very close to closure of the here as well. 08/14/19 upon evaluation today patient appears to be doing excellent in regard to his lower extremities. In fact he appears to be completely healed as of today.  Fortunately there's no signs of active infection at this time. No fevers, chills, nausea, or vomiting noted at this time. READMISSION 09/26/2019 This is a 74 year old man who is well-known to this clinic having just been discharged on 08/14/2019. He has known lymphedema and chronic venous insufficiency. He has Farrow wrap stockings and also external compression pumps. He does not use the external compression pumps however. He does however apparently fairly faithfully use the stockings. They have not been lotion in his legs. He has developed new wounds on the right anterior as well as the left medial left lateral and left posterior calf. He returns for our review of this Past medical history includes coronary artery disease, hypertension, congestive heart failure, COPD, venous insufficiency with lymphedema, type 2 diabetes. He apparently has compression pumps, Farrow wraps and at 1 point a hospital bed although I believe he sleeps in a recliner 10/03/2019 upon evaluation today patient appears to be doing better with regard to his wounds in general. He has been tolerating the dressing changes without complication. Fortunately there is no signs of active infection. No fevers, chills, nausea, vomiting, or diarrhea. I do believe the compression wraps are helping as they always have in the past. 10/10/2019 upon evaluation today patient appears to be doing excellent at this point. His right lower extremity is measuring much better and in fact is completely healed. His left lower extremity is also measuring better though not healed seems to be doing excellent at this point which is great news. Overall I am very pleased with how things appear currently. 10/27/2019 upon evaluation today patient appears to be doing quite well with regard to his wounds currently. He has been tolerating the dressing changes without complication. Fortunately there is no signs of active infection at this time. In fact he is almost  completely healed on the left and has just a very small area open and on the right he is completely healed. Adam Merritt, Adam Merritt ([id number]) Objective Constitutional Well-nourished and well-hydrated in no acute distress. Vitals Time Taken: 11:40 AM, Temperature: 98.8 F, Pulse: 79 bpm, Respiratory Rate: 18 breaths/min, Blood Pressure: 134/74 mmHg. Respiratory normal breathing without difficulty. Psychiatric this patient is able to make decisions and demonstrates good insight into disease process. Alert and Oriented x 3. pleasant and cooperative. General Notes: Upon inspection patient's wound bed actually showed signs of good granulation at this time on the left lower extremity. Overall very pleased with how things seem to be progressing. Fortunately there is no signs of active infection at this time which is also good news. Integumentary (Hair, Skin) Wound #32 status is Open. Original cause of wound was Blister. The wound is located on the Left,Lateral Lower Leg. The wound measures 1cm length x 0.8cm width x 0.1cm depth; 0.628cm^2 area and 0.063cm^3 volume. There is Fat Layer (Subcutaneous Tissue) Exposed exposed. There is a medium amount of serosanguineous drainage noted. There is medium (34-66%) red granulation within the wound bed. There is a medium (34-66%) amount of necrotic tissue within the wound bed including Adherent Slough. Wound #33 status is Healed - Epithelialized. Original cause  of wound was Blister. The wound is located on the Left,Medial Lower Leg. The wound measures 0cm length x 0cm width x 0cm depth; 0cm^2 area and 0cm^3 volume. There is Fat Layer (Subcutaneous Tissue) Exposed exposed. There is a medium amount of serosanguineous drainage noted. The wound margin is flat and intact. There is medium (34-66%) red granulation within the wound bed. There is a medium (34-66%) amount of necrotic tissue within the wound bed including Adherent Slough. 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 There was a Four Layer Compression Therapy Procedure by Army Melia, RN. Post procedure Diagnosis Wound #32: Same as Pre-Procedure Plan Wound Cleansing: Wound #32 Left,Lateral Lower Leg: Clean wound with Normal Saline. May shower with protection. - Please do not get your wraps wet Skin Barriers/Peri-Wound Care: Moisturizing lotion - to both lower legs Primary Wound Dressing: Wound #32 Left,Lateral Lower Leg: Silver Alginate Secondary Dressing: Wound #32 Left,Lateral Lower Leg: LOI, RENNAKER ([id number]) ABD pad Dressing Change Frequency: Change dressing every week - Please call for nurse visit if your wraps start to slide down your legs Follow-up Appointments: Return Appointment in 1 week. Nurse Visit as needed - Please call for nurse visit if your wraps start to slide down your legs Edema Control: Wound #32 Left,Lateral Lower Leg: 4-Layer Compression System - Left Lower Extremity. Elevate legs to the level of the heart and pump ankles as often as possible Compression Pump: Use compression pump on left lower extremity for 60 minutes, twice daily. Compression Pump: Use compression pump on right lower extremity for 60 minutes, twice daily. 1. I would recommend currently that we continue with the compression wrap for left lower extremity where he still has a small open wound. 2. I am also can recommend currently that he continue to elevate his legs much as possible. 3. We will use his Velcro compression wrap for the right lower extremity and were also looking into getting the proper size for the wraps we ordered for him as well. Were not  sure exactly where that stands at this point. Understanding was the company should have already sent this out to him but again it does not appear that the case. We will see patient back for reevaluation in 1 week here in the clinic. If anything worsens or changes patient will contact our office for additional recommendations. Electronic Signature(s) Signed: 10/27/2019 1:21:06 PM By: Worthy Keeler PA-C Entered By: Worthy Keeler on 10/27/2019 13:21:06 Adam Merritt ([id number]) -------------------------------------------------------------------------------- SuperBill Details Patient Name: Adam Merritt Date of Service: 10/27/2019 Medical Record Number: [id number] Patient Account Number: [id number] Date of Birth/Sex: 1945/07/06 (74 y.o. M) Treating RN: Army Melia Primary Care Provider: [name] Other Clinician: Referring Provider: [name] Treating Provider/Extender: Melburn Hake, Bonny Egger [name]: 4 Diagnosis Coding ICD-10 Codes Code Description I89.0 Lymphedema, not elsewhere classified L97.222 Non-pressure chronic ulcer of left calf with fat layer exposed L97.812 Non-pressure chronic ulcer of other part of right lower leg with fat layer exposed Facility Procedures CPT4 Code: 08657846 Description: (Facility Use Only) 29581LT - APPLY MULTLAY COMPRS LWR LT LEG Modifier: Quantity: 1 Physician Procedures CPT4 Code: 9629528 Description: 99213 - WC PHYS LEVEL 3 - EST PT Modifier: Quantity: 1 CPT4 Code:  Description: ICD-10 Diagnosis Description I89.0 Lymphedema, not elsewhere classified L97.222 Non-pressure chronic ulcer of left calf with fat layer exposed L97.812 Non-pressure chronic ulcer of other part of right lower leg with fat la Modifier: yer exposed Quantity: Electronic Signature(s) Signed: 10/27/2019 1:21:39 PM By: Worthy Keeler PA-C Previous Signature: 10/27/2019 12:57:56 PM Version By: Army Melia Entered By: Worthy Keeler on 10/27/2019 13:21:36

## 2019-11-04 ENCOUNTER — Other Ambulatory Visit: Payer: Self-pay

## 2019-11-04 ENCOUNTER — Encounter: Payer: Medicare PPO | Attending: Physician Assistant | Admitting: Physician Assistant

## 2019-11-04 DIAGNOSIS — I509 Heart failure, unspecified: Secondary | ICD-10-CM | POA: Diagnosis not present

## 2019-11-04 DIAGNOSIS — L97812 Non-pressure chronic ulcer of other part of right lower leg with fat layer exposed: Secondary | ICD-10-CM | POA: Insufficient documentation

## 2019-11-04 DIAGNOSIS — D649 Anemia, unspecified: Secondary | ICD-10-CM | POA: Diagnosis not present

## 2019-11-04 DIAGNOSIS — M1990 Unspecified osteoarthritis, unspecified site: Secondary | ICD-10-CM | POA: Insufficient documentation

## 2019-11-04 DIAGNOSIS — I890 Lymphedema, not elsewhere classified: Secondary | ICD-10-CM | POA: Insufficient documentation

## 2019-11-04 DIAGNOSIS — L97222 Non-pressure chronic ulcer of left calf with fat layer exposed: Secondary | ICD-10-CM | POA: Diagnosis not present

## 2019-11-04 DIAGNOSIS — E11622 Type 2 diabetes mellitus with other skin ulcer: Secondary | ICD-10-CM | POA: Diagnosis not present

## 2019-11-04 DIAGNOSIS — I2510 Atherosclerotic heart disease of native coronary artery without angina pectoris: Secondary | ICD-10-CM | POA: Insufficient documentation

## 2019-11-04 DIAGNOSIS — K5900 Constipation, unspecified: Secondary | ICD-10-CM | POA: Insufficient documentation

## 2019-11-04 DIAGNOSIS — G4730 Sleep apnea, unspecified: Secondary | ICD-10-CM | POA: Diagnosis not present

## 2019-11-04 DIAGNOSIS — E669 Obesity, unspecified: Secondary | ICD-10-CM | POA: Diagnosis not present

## 2019-11-04 DIAGNOSIS — G8929 Other chronic pain: Secondary | ICD-10-CM | POA: Insufficient documentation

## 2019-11-04 DIAGNOSIS — I872 Venous insufficiency (chronic) (peripheral): Secondary | ICD-10-CM | POA: Diagnosis not present

## 2019-11-04 DIAGNOSIS — I110 Hypertensive heart disease with heart failure: Secondary | ICD-10-CM | POA: Insufficient documentation

## 2019-11-04 DIAGNOSIS — E1140 Type 2 diabetes mellitus with diabetic neuropathy, unspecified: Secondary | ICD-10-CM | POA: Insufficient documentation

## 2019-11-04 DIAGNOSIS — J449 Chronic obstructive pulmonary disease, unspecified: Secondary | ICD-10-CM | POA: Diagnosis not present

## 2019-11-04 DIAGNOSIS — E1151 Type 2 diabetes mellitus with diabetic peripheral angiopathy without gangrene: Secondary | ICD-10-CM | POA: Diagnosis not present

## 2019-11-04 NOTE — Progress Notes (Addendum)
TAGGERT, BOZZI ([id number]) Visit Report for 11/04/2019 Arrival Information Details Patient Name: Adam Merritt, Adam Merritt Date of Service: 11/04/2019 11:00 AM Medical Record Number: [id number] Patient Account Number: [id number] Date of Birth/Sex: 09-29-1945 (74 y.o. M) Treating RN: Army Melia Primary Care Rudell Ortman: [name] Other Clinician: Referring Dacotah Cabello: [name] Treating Janit Cutter/Extender: Melburn Hake, [name]: 5 Visit Information History Since Last Visit Added or deleted any medications: No Patient Arrived: Cane Any new allergies or adverse reactions: No Arrival Time: 11:10 Had a fall or experienced change in No Accompanied By: wife activities of daily living that may affect Transfer Assistance: None risk of falls: Patient Has Alerts: Yes Signs or symptoms of abuse/neglect since last visito No Patient Alerts: Patient on Blood Thinner Hospitalized since last visit: No DMII Has Dressing in Place as Prescribed: Yes Aspirin 325 Pain Present Now: No Electronic Signature(s) Signed: 11/06/2019 11:31:22 AM By: Army Melia Entered By: Army Melia on 11/04/2019 11:10:57 Adam Merritt ([id number]) -------------------------------------------------------------------------------- Compression Therapy Details Patient Name: Adam Merritt Date of Service: 11/04/2019 11:00 AM Medical Record Number: [id number] Patient Account Number: [id number] Date of Birth/Sex: [birth year] (74 y.o. M) Treating RN: Montey Hora Primary Care Jenesis Martin: [name] Other Clinician: Referring Leoma Folds: [name] Treating Granville Whitefield/Extender: Melburn Hake, [name]: 5 Compression Therapy Performed for Wound Assessment: NonWound Condition Lymphedema - Bilateral Leg Performed By: Clinician Montey Hora, RN Compression Type: Four Layer Post Procedure Diagnosis Same as Pre-procedure Electronic Signature(s) Signed: 11/04/2019 4:28:28 PM By: Montey Hora Entered By: Montey Hora  on 11/04/2019 11:26:52 Adam Merritt ([id number]) -------------------------------------------------------------------------------- Encounter Discharge Information Details Patient Name: Adam Merritt Date of Service: 11/04/2019 11:00 AM Medical Record Number: [id number] Patient Account Number: [id number] Date of Birth/Sex: Jun 07, 1945 (74 y.o. M) Treating RN: Montey Hora Primary Care Taiyana Kissler: [name] Other Clinician: Referring Vermelle Cammarata: [name] Treating Avalee Castrellon/Extender: Melburn Hake, [name]: 5 Encounter Discharge Information Items Discharge Condition: Stable Ambulatory Status: Cane Discharge Destination: Home Transportation: Private Auto Accompanied By: spouse Schedule Follow-up Appointment: Yes Clinical Summary of Care: Electronic Signature(s) Signed: 11/04/2019 11:39:11 AM By: Montey Hora Entered By: Montey Hora on 11/04/2019 11:39:10 Adam Merritt ([id number]) -------------------------------------------------------------------------------- Lower Extremity Assessment Details Patient Name: Adam Merritt Date of Service: 11/04/2019 11:00 AM Medical Record Number: [id number] Patient Account Number: [id number] Date of Birth/Sex: 1946-01-29 (74 y.o. M) Treating RN: Army Melia Primary Care Rilei Kravitz: [name] Other Clinician: Referring Deannie Resetar: [name] Treating Kimmora Risenhoover/Extender: Melburn Hake, [name]: 5 Edema Assessment Assessed: [Left: No] [Right: No] Edema: [Left: Ye] [Right: s] Calf Left: Right: Point of Measurement: 31 cm From Medial Instep 54 cm cm Ankle Left: Right: Point of Measurement: 11 cm From Medial Instep 36 cm cm Vascular Assessment Pulses: Dorsalis Pedis Palpable: [Left:Yes] Electronic Signature(s) Signed: 11/06/2019 11:31:22 AM By: Army Melia Entered By: Army Melia on 11/04/2019 11:20:06 Adam Merritt  ([id number]) -------------------------------------------------------------------------------- Multi Wound Chart Details Patient Name: Adam Merritt Date of Service: 11/04/2019 11:00 AM Medical Record Number: [id number] Patient Account Number: [id number] Date of Birth/Sex: 1945/09/28 (74 y.o. M) Treating RN: Montey Hora Primary Care Danaya Geddis: [name] Other Clinician: Referring Farrell Broerman: [name] Treating Trista Ciocca/Extender: Melburn Hake, [name]: 5 Vital Signs Height(in): Pulse(bpm): 97 Weight(lbs): Blood Pressure(mmHg): 160/69 Body Mass Index(BMI): Temperature(F): 98.4 Respiratory Rate(breaths/min): 16 Photos: [N/A:N/A] Wound Location: Left, Lateral Lower Leg N/A N/A Wounding Event: Blister N/A N/A Primary Etiology: Diabetic Wound/Ulcer of the Lower N/A N/A Extremity Comorbid History: Anemia, Lymphedema, Chronic N/A N/A  Obstructive Pulmonary Disease (COPD), Sleep Apnea, Congestive Heart Failure, Coronary Artery Disease, Hypertension, Peripheral Venous Disease, Type II Diabetes, Osteoarthritis, Neuropathy, Received Chemotherapy Date Acquired: 09/18/2019 N/A N/A Weeks of Treatment: 5 N/A N/A Wound Status: Healed - Epithelialized N/A N/A Measurements L x W x D (cm) 0x0x0 N/A N/A Area (cm) : 0 N/A N/A Volume (cm) : 0 N/A N/A % Reduction in Area: 100.00% N/A N/A % Reduction in Volume: 100.00% N/A N/A Classification: Grade 2 N/A N/A Exudate Amount: None Present N/A N/A Granulation Amount: None Present (0%) N/A N/A Necrotic Amount: None Present (0%) N/A N/A Exposed Structures: Fascia: No N/A N/A Fat Layer (Subcutaneous Tissue) Exposed: No Tendon: No Muscle: No Joint: No Bone: No Limited to Skin Breakdown Epithelialization: Large (67-100%) N/A N/A Treatment Notes Electronic Signature(s) Signed: 11/04/2019 4:28:28 PM By: Montey Hora Entered By: Montey Hora on 11/04/2019 11:24:57 Adam Merritt ([id number]) Adam Merritt  ([id number]) -------------------------------------------------------------------------------- Multi-Disciplinary Care Plan Details Patient Name: Adam Merritt Date of Service: 11/04/2019 11:00 AM Medical Record Number: [id number] Patient Account Number: [id number] Date of Birth/Sex: 08-08-1945 (74 y.o. M) Treating RN: Montey Hora Primary Care Seba Madole: [name] Other Clinician: Referring Winton Offord: [name] Treating Kailea Dannemiller/Extender: Melburn Hake, [name]: 5 Active Inactive Abuse / Safety / Falls / Self Care Management Nursing Diagnoses: Potential for falls Goals: Patient will remain injury free related to falls Date Initiated: 09/26/2019 Target Resolution Date: 12/13/2019 Goal Status: Active Interventions: Assess fall risk on admission and as needed Notes: Electronic Signature(s) Signed: 11/04/2019 4:28:28 PM By: Montey Hora Entered By: Montey Hora on 11/04/2019 11:24:45 Adam Merritt ([id number]) -------------------------------------------------------------------------------- Pain Assessment Details Patient Name: Adam Merritt Date of Service: 11/04/2019 11:00 AM Medical Record Number: [id number] Patient Account Number: [id number] Date of Birth/Sex: March 21, 1946 (74 y.o. M) Treating RN: Army Melia Primary Care Cole Klugh: [name] Other Clinician: Referring Aiman Noe: [name] Treating Densel Kronick/Extender: Melburn Hake, [name]: 5 Active Problems Location of Pain Severity and Description of Pain Patient Has Paino No Site Locations Pain Management and Medication Current Pain Management: Electronic Signature(s) Signed: 11/06/2019 11:31:22 AM By: Army Melia Entered By: Army Melia on 11/04/2019 11:11:45 Adam Merritt ([id number]) -------------------------------------------------------------------------------- Patient/Caregiver Education Details Patient Name: Adam Merritt Date of Service: 11/04/2019 11:00 AM Medical Record  Number: [id number] Patient Account Number: [id number] Date of Birth/Gender: 08/05/1945 (74 y.o. M) Treating RN: Montey Hora Primary Care Physician: [name] Other Clinician: Referring Physician: [name] Treating Physician/Extender: Sharalyn Ink in Treatment: 5 Education Assessment Education Provided To: Patient and Caregiver Education Topics Provided Venous: Handouts: Other: ongoing compression Methods: Explain/Verbal Responses: State content correctly Electronic Signature(s) Signed: 11/04/2019 4:28:28 PM By: Montey Hora Entered By: Montey Hora on 11/04/2019 11:38:30 Adam Merritt ([id number]) -------------------------------------------------------------------------------- Wound Assessment Details Patient Name: Adam Merritt Date of Service: 11/04/2019 11:00 AM Medical Record Number: [id number] Patient Account Number: [id number] Date of Birth/Sex: 05-10-1946 (74 y.o. M) Treating RN: Army Melia Primary Care Tyjay Galindo: [name] Other Clinician: Referring Azyriah Nevins: [name] Treating Daivon Rayos/Extender: STONE III, [name]: 5 Wound Status Wound Number: 32 Primary Diabetic Wound/Ulcer of the Lower Extremity Etiology: Wound Location: Left, Lateral Lower Leg Wound Healed - Epithelialized Wounding Event: Blister Status: Date Acquired: 09/18/2019 Comorbid Anemia, Lymphedema, Chronic Obstructive Pulmonary Weeks Of Treatment: 5 History: Disease (COPD), Sleep Apnea, Congestive Heart Failure, Clustered Wound: No Coronary Artery Disease, Hypertension, Peripheral Venous Disease, Type II Diabetes, Osteoarthritis, Neuropathy, Received Chemotherapy Photos Wound Measurements Length: (cm) 0 Width: (cm) 0  Depth: (cm) 0 Area: (cm) 0 Volume: (cm) 0 % Reduction in Area: 100% % Reduction in Volume: 100% Epithelialization: Large (67-100%) Tunneling: No Undermining: No Wound Description Classification: Grade 2 Exudate Amount: None  Present Foul Odor After Cleansing: No Slough/Fibrino No Wound Bed Granulation Amount: None Present (0%) Exposed Structure Necrotic Amount: None Present (0%) Fascia Exposed: No Fat Layer (Subcutaneous Tissue) Exposed: No Tendon Exposed: No Muscle Exposed: No Joint Exposed: No Bone Exposed: No Limited to Skin Breakdown Electronic Signature(s) Signed: 11/04/2019 4:28:28 PM By: Montey Hora Signed: 11/06/2019 11:31:22 AM By: Army Melia Entered By: Montey Hora on 11/04/2019 11:24:26 Adam Merritt ([id number]) -------------------------------------------------------------------------------- [place] Details Patient Name: Adam Merritt Date of Service: 11/04/2019 11:00 AM Medical Record Number: [id number] Patient Account Number: [id number] Date of Birth/Sex: 01/13/1946 (74 y.o. M) Treating RN: Army Melia Primary Care Adean Milosevic: [name] Other Clinician: Referring Delwin Raczkowski: [name] Treating Lacey Dotson/Extender: Melburn Hake, [name]: 5 Vital Signs Time Taken: 11:10 Temperature (F): 98.4 Pulse (bpm): 73 Respiratory Rate (breaths/min): 16 Blood Pressure (mmHg): 160/69 Reference Range: 80 - 120 mg / dl Electronic Signature(s) Signed: 11/06/2019 11:31:22 AM By: Army Melia Entered By: Army Melia on 11/04/2019 11:11:20

## 2019-11-04 NOTE — Progress Notes (Addendum)
Adam Merritt, Adam Merritt ([id number]) Visit Report for 11/04/2019 Chief Complaint Document Details Patient Name: Adam Merritt, Adam Merritt Date of Service: 11/04/2019 11:00 AM Medical Record Number: [id number] Patient Account Number: [id number] Date of Birth/Sex: [birth year] (74 y.o. M) Treating RN: Montey Hora Primary Care Provider: [name] Other Clinician: Referring Provider: [name] Treating Provider/Extender: Melburn Hake, Teya Otterson [name]: 5 Information Obtained from: Patient Chief Complaint Bilateral LE Ulcers and right second toe ulcer 09/26/2019; patient is here for review bilateral lower leg wounds. He is well familiar to this clinic Electronic Signature(s) Signed: 11/04/2019 11:09:16 AM By: Worthy Keeler PA-C Entered By: Worthy Keeler on 11/04/2019 11:09:16 Adam Merritt ([id number]) -------------------------------------------------------------------------------- HPI Details Patient Name: Adam Merritt Date of Service: 11/04/2019 11:00 AM Medical Record Number: [id number] Patient Account Number: [id number] Date of Birth/Sex: [birth year] (74 y.o. M) Treating RN: Montey Hora Primary Care Provider: [name] Other Clinician: Referring Provider: [name] Treating Provider/Extender: Melburn Hake, Shandi Godfrey [name]: 5 History of Present Illness HPI Description: 10/18/17-He is here for initial evaluation of bilateral lower extremity ulcerations in the presence of venous insufficiency and lymphedema. He has been seen by vascular medicine in the past, Dr. Lucky Cowboy, last seen in 2016. He does have a history of abnormal ABIs, which is to be expected given his lymphedema and venous insufficiency. According to Epic, it appears that all attempts for arterial evaluation and/or angiography were not follow through with by patient. He does have a history of being seen in lymphedema clinic in 2018, stopped going approximately 6 months ago stating "it didn't do any good". He does not have  lymphedema pumps, he does not have custom fit compression wrap/stockings. He is diabetic and his recent A1c last month was 7.6. He admits to chronic bilateral lower extremity pain, no change in pain since blister and ulceration development. He is currently being treated with Levaquin for bronchitis. He has home health and we will continue. 10/25/17-He is here in follow-up evaluation for bilateral lower extremity ulcerationssubtle he remains on Levaquin for bronchitis. Right lower extremity with no evidence of drainage or ulceration, persistent left lower extremity ulceration. He states that home health has not been out since his appointment. He went to [facility] on Tuesday, studies revealed: RIGHT ABI 0.9, TBI 0.6 LEFT ABI 1.1, TBI 0.6 with triphasic flow bilaterally. We will continue his same treatment plan. He has been educated on compression therapy and need for elevation. He will benefit from lymphedema pumps 11/01/17-He is here in follow-up evaluation for left lower extremity ulcer. The right lower extremity remains healed. He has home health services, but they have not been out to see the patient for 2-3 weeks. He states it home health physical therapy changed his dressing yesterday after therapy; he placed Ace wrap compression. We are still waiting for lymphedema pumps, reordered d/t need for company change. 11/08/17-He is here in follow-up evaluation for left lower extremity ulcer. It is improved. Edema is significantly improved with compression therapy. We will continue with same treatment plan and he will follow-up next week. No word regarding lymphedema pumps 11/15/17-He is here in follow-up evaluation for left lower extremity ulcer. He is healed and will be discharged from wound care services. I have reached out to [facility] regarding his lymphedema pumps. They have been unable to reach the patient; the contact number they had with the patient's wife's cell phone and  she has not answered any unrecognized calls. Contact should be made today, trial planned  for next week; [facility] will continue to follow 11/27/17 on evaluation today patient has multiple blistered areas over the right lower extremity his left lower extremity appears to be doing okay. These blistered areas show signs of no infection which is great news. With that being said he did have some necrotic skin overlying which was mechanically debrided away with saline and gauze today without complication. Overall post debridement the wounds appear to be doing better but in general his swelling seems to be increased. This is obviously not good news. I think this is what has given rise to the blisters. 12/04/17 on evaluation today patient presents for follow-up concerning his bilateral lower extremity edema in the right lower extremity ulcers. He has been tolerating the dressing changes without complication. With that being said he has had no real issues with the wraps which is also good news. Overall I'm pleased with the progress he's been making. 12/11/17 on evaluation today patient appears to be doing rather well in regard to his right lateral lower extremity ulcer. He's been tolerating the dressing changes without complication. Fortunately there does not appear to be any evidence of infection at this time. Overall I'm pleased with the progress that is being made. Unfortunately he has been in the hospital due to having what sounds to be a stomach virus/flu fortunately that is starting to get better. 12/18/17 on evaluation today patient actually appears to be doing very well in regard to his bilateral lower extremities the swelling is under fairly good control his lymphedema pumps are still not up and running quite yet. With that being said he does have several areas of opening noted as far as wounds are concerned mainly over the left lower extremity. With that being said I do believe once he gets  lymphedema pumps this would least help you mention some the fluid and preventing this from occurring. Hopefully that will be set up soon sleeves are Artie in place at his home he just waiting for the machine. 12/25/17 on evaluation today patient actually appears to be doing excellent in fact all of his ulcers appear to have resolved his legs appear very well. I do think he needs compression stockings we have discussed this and they are actually going to go to [place] today to elastic therapy to get this fitted for him. I think that is definitely a good thing to do. Readmission: 04/09/18 upon evaluation today this patient is seen for readmission due to bilateral lower extremity lymphedema. He has significant swelling of his extremities especially on the left although the right is also swollen he has weeping from both sides. There are no obvious open wounds at this point. Fortunately he has been doing fairly well for quite a bit of time since I last saw him. Nonetheless unfortunately this seems to have reopened and is giving quite a bit of trouble. He states this began about a week ago when he first called [place] to get in to be seen. No fevers, chills, nausea, or vomiting noted at this time. He has not been using his lymphedema pumps due to the fact that they won't fit on his leg at this point likewise is also not been using his compression for essentially the same reason. 04/16/18 upon evaluation today patient actually appears to be doing a little better in regard to the fluid in his bilateral lower extremities. With that being said he's had three falls since I saw him last week. He also states that he's been feeling  very poorly. I was concerned last week and feel like that the concern is still there as far as the congestion in his chest is concerned he seems to be breathing about the same as last week but again he states he's very weak he's not even able to walk further than from the chair to the door.  His wife had to buy a wheelchair just to be able to get them out of the house to get to the appointment today. This has me very concerned. 04/23/18 on evaluation today patient actually appears to be doing much better than last week's evaluation. At that time actually had to transport him to the ER via EMS and he subsequently was admitted for acute pulmonary edema, acute renal failure, and acute congestive heart failure. Fortunately he is doing much better. Apparently they did dialyze him and were able to take off roughly 35 pounds of fluid. Nonetheless he is feeling much better both in regard to his breathing and he's able to get around much better at this time compared to previous. Overall I'm very Adam Merritt, Adam Merritt ([id number]) happy with how things are at this time. There does not appear to be any evidence of infection currently. No fevers, chills, nausea, or vomiting noted at this time. 04/30/2018 patient seen today for follow-up and management of bilateral lower extremity lymphedema. He did express being more sad today than usual due to the recent loss of his dog. He states that he has been compliant with using the lymphedema pumps. However he does admit a minute over the last 2-3 days he has not been using the pumps due to the recent loss of his dog. At this time there is no drainage or open wounds to his lower extremities. The left leg edema is measuring smaller today. Still has a significant amount of edema on bilateral lower extremities With dry flaky skin. He will be referred to the lymphedema clinic for further management. Will continue 3 layer compression wraps and follow-up in 1-2 weeks.Denies any pain, fever, chairs recently. No recent falls or injuries reported during this visit. 05/07/18 on evaluation today patient actually appears to be doing very well in regard to his lower extremities in general all things considered. With that being said he is having some pain in the legs just due to  the amount of swelling. He does have an area where he had a blister on the left lateral lower extremity this is open at this point other than that there's nothing else weeping at this time. 05/14/18 on evaluation today patient actually appears to be doing excellent all things considered in regard to his lower extremities. He still has a couple areas of weeping on each leg which has continued to be the issue for him. He does have an appointment with the lymphedema clinic although this isn't until February 2020. That was the earliest they had. In the meantime he has continued to tolerate the compression wraps without complication. 05/28/18 on evaluation today patient actually appears to be doing more poorly in regard to his left lower extremity where he has a wound open at this point. He also had a fall where he subsequently injured his right great toe which has led to an open wound at the site unfortunately. He has been tolerating the dressing changes without complication in general as far as the wraps are concerned that he has not been putting any dressing on the left 1st toe ulcer site. 06/11/18 on evaluation today patient appears to  be doing much worse in regard to his bilateral lower extremity ulcers. He has been tolerating the dressing changes without complication although his legs have not been wrapped more recently. Overall I am not very pleased with the way his legs appear. I do believe he needs to be back in compression wraps he still has not received his compression wraps from the [facility] hospital as of yet. 06/18/18 on evaluation today patient actually appears to be doing significantly better than last time I saw him. He has been tolerating the compression wraps without complication in the circumferential ulcers especially appear to be doing much better. His toe ulcer on the right in regard to the great toe is better although not as good as the legs in my pinion. No fevers chills noted 07/02/18 on  evaluation today patient appears to be doing much better in regard to his lower extremity ulcers. Unfortunately since I last saw him he's had the distal portion of his right great toe if he dated it sounds as if this actually went downhill very quickly. I had only seen him a few days prior and the toe did not appear to be infected at that point subsequently became infected very rapidly and it was decided by the surgeon that the distal portion of the toe needed to be removed. The patient seems to be doing well in this regard he tells me. With that being said his lower extremities are doing better from the standpoint of the wounds although he is significantly swollen at this point. 07/09/18 on evaluation today patient appears to be doing better in regard to the wounds on his lower extremities. In fact everything is almost completely healed he is just a small area on the left posterior lower extremity that is open at this point. 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 With that being said he needs some kind of compression it does sound like he may not be wearing his compression, that is the wraps, during the entire time between when he's here visit to visit. Apparently his wife took the current one off because it began to "fall apart". 07/16/18 on evaluation today patient appears to be extremely swollen especially in regard to his left lower extremity unfortunately. He also has a new skin tear over the left lower extremity and there's a smaller area on the right lower extremity as well. Unfortunately this seems to be due in part to blistering and fluid buildup in his leg. He did get the reduction wraps that were ordered by the [facility] hospital for him to go to lymphedema clinic. With that being said his wounds on the legs have  not healed to the point to where they would likely accept them as a patient lymphedema clinic currently. We need to try to get this to heal. With that being said he's been taking his wraps off which is not doing him any favors at this point. In fact this is probably quite counterproductive compared to what needs to occur. We will likely need to increase to a four layer compression wrap and continue to also utilize elevation and he has to keep the wraps on not take them off as he's been doing currently hasn't had a wrap on since Saturday. 07/23/18 on evaluation today patient appears to be doing much better in regard to  his bilateral lower extremities. In fact his left lower extremity which was the largest is actually 15 cm smaller today compared to what it was last time he was here in our clinic. This is obviously good news after just one week. Nonetheless the differences he actually kept the wraps on during the entire week this time. That's not typical for him. I do believe he understands a little bit better now the severity of the situation and why it's important for him to keep these wraps on. 07/30/18 on evaluation today patient actually appears to be doing rather well in regard to his lower extremities. His legs are much smaller than they have been in the past and he actually has only one very small rudder superficial region remaining that is not closed on the left lateral/posterior lower extremity even this is almost completely close. I do believe likely next week he will be healed without any complications. I do think we need to continue the wraps however this seems to be beneficial for him. I also think it may be a good time for [place] to go ahead and see about getting the appointment with the lymphedema clinic which is supposed to be made for him in order to keep this moving along and hopefully get them into compression wraps that will in the end help him to remain healed. 08/06/18 on evaluation today  patient actually appears to be doing very well in regard as bilateral lower extremities. In fact his wounds appear to be completely healed at this time. 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 He is in the process of getting appointment with the lymphedema clinic we have made this referral were just waiting to hear back on the schedule time. We need to follow up on that today as well. 08/13/18 on evaluation today patient actually appears to be doing very well in regard to his bilateral lower extremities there are no open wounds at this point. We are gonna go ahead and see about ordering the Velcro compression wraps for him had a discussion with them about [place] doing it versus the [place] they feel like they can definitely afford going ahead and get the wraps themselves and they would prefer to try to avoid having to go to the lymphedema clinic if it all possible which I completely understand. As long as he has good compression I'm okay either way. 3/17//20 on evaluation today patient actually appears to be doing well in regard to his bilateral lower extremity ulcers. He has been tolerating the dressing changes without complication specifically the compression wraps. Overall is had no issues my fingers finding that I see at this point is that he is having trouble with constipation. He tells me he has not been able to go to the bathroom for about six days. He's taken to over-the- counter oral laxatives unfortunately this is not helping. He has not contacted his doctor. Adam Merritt, Adam Merritt ([id number]) 08/27/18 on evaluation today patient appears to be doing fairly well in regard to his lower extremities at this point. There does not appear to be any new altars is swelling is very well controlled. We are still waiting for his Velcro compression wraps to arrive that should be sometime in the next week is Artie sent the check for these. 09/03/18 on  evaluation today patient appears to be doing excellent in regard to his bilateral lower extremities which he shows no signs of wound openings in regard to  this point. He does have his Velcro compression wraps which did arrive in the mail since I saw him last week. Overall he is doing excellent in my pinion. 09/13/18 on evaluation today patient appears to be doing very well currently in regard to the overall appearance of his bilateral lower extremities although he's a little bit more swollen than last time we saw him. At that point he been discharged without any open wounds. Nonetheless he has a small open wound on the posterior left lower extremity with some evidence of cellulitis noted as well. Fortunately I feel like he has made good progress overall with regard to his lower extremities from were things used to be. 09/20/18 on evaluation today patient actually appears to be doing much better. The erythematous lower extremity is improving wound itself which is still open appears to be doing much better as far as but appearance as well as pain is concerned overall very pleased in this regard. There's no signs of active infection at this time. 09/27/18 on evaluation today patient's wounds on the lower extremity actually appear to be doing fairly well at this time which is good news. There is no evidence of active infection currently and again is just as left lower extremity were there any wounds at this point anyway. I believe they may be completely healed but again I'm not 100% sure based on evaluation today. I think one more week of observation would likely be a good idea. 10/04/18 on evaluation today patient actually appears to be doing excellent in regard to the left lower extremity which actually appears to be completely healed as of today. Unfortunately he's been having issues with his right lower extremity have a new wound that has opened. Fortunately there's no evidence of active infection at this  time which is good news. 10/10/18 upon evaluation today patient actually appears to be doing a little bit worse with the new open area on his right posterior lower extremity. He's been tolerating the dressing changes without complication. Right now we been using his compression wraps although I think we may need to switch back to actually performing bilateral compression wraps are in the clinic. No fevers, chills, nausea, or vomiting noted at this time. 10/17/18 on evaluation today patient actually appears to be doing quite well in regard to his bilateral lower extremity ulcers. He has been tolerating the reps without complication although he would prefer not to rewrap his legs as of today. Fortunately there's no signs of active infection which is good news. No fevers, chills, nausea, or vomiting noted at this time. 10/24/18 on evaluation today patient appears to be doing very well in regard to his lower extremities. His right lower extremity is shown signs of healing and his left lower [place] though not healed appears to be improving which is excellent news. Overall very pleased with how things seem to be progressing at this time. The patient is likewise happy to hear this. His Velcro compression wraps however have not been put on properly will gonna show his wife how to do that properly today. 10/31/18 on evaluation today patient appears to be doing more poorly in regard to his left lower extremity in particular although both lower extremities actually are showing some signs of being worse than my previous evaluation. Unfortunately I'm just not sure that his compression stockings even with the use of the compression/lymphedema pumps seem to be controlling this well. Upon further questioning he tells me that he also is not able to  lie flat in the bed due to his congestive heart failure and difficulty breathing. For that reason he sleeps in his recliner. To make matters worse his recliner also cannot even  hold his legs up so instead of even being somewhat elevated they pretty much hang down to the floor. This is the way he sleeps each night which is definitely counterproductive to everything else that were attempting to do from the standpoint of controlling his fluid. Nonetheless I think that he potentially could benefit from a hospital bed although this would be something that his primary care provider would likely have to order since anything that is order on our side has to be directly related to wound care and again the hospital bed is not necessarily a direct relation to although I think it does contribute to his overall wound status on his lower extremities. 11/07/18 on evaluation today patient appears to be doing better in regard to his bilateral lower extremity. He's been tolerating the dressing changes without complication. We did in the interim since I last saw him switch to just using extras orbiting new alginate and that seems to have done much better for him. I'm very pleased with the overall progress that is made. 11/14/18 on evaluation today patient appears to be redoing rather well in regard to his left lower extremity ulcers which are the only ones remaining at this point. Fortunately there's no signs of active infection at this time which is good news. Overall been very pleased with how things seem to be progressing currently. No fevers, chills, nausea, or vomiting noted at this time. 11/20/17 on evaluation today patient actually appears to be doing quite well in regard to his lower extremities on the left on the right he has several blisters that showed up although there's some question about whether or not he has had his broker compression wraps on like he was supposed to or not. Fortunately there's no signs of active infection at this time which is good news. Unfortunately though he is doing well from the standpoint of the left leg the right leg is not doing as well again this is when  we did not wrap last week. 11/28/18 on evaluation today patient appears to be doing well in regard to his bilateral lower extremities is left appears to be healed is right is not healed but is very close to being so. Overall very pleased with how things seem to be progressing. Patient is likewise happy that things are doing well. 12/09/18 on evaluation today patient actually appears to be completely healed which is excellent news. He actually seems to be doing well in regard to the swelling of the bilateral lower extremities which is also great news. Overall very pleased with how things seem to be progressing. Readmission: 03/18/2019 upon evaluation today patient presents for reevaluation concerning issues that he is having with his left lower extremity where he does have a wound noted upon inspection today. He also has a wound on the right second toe on the tip where it is apparently been rubbing in his shoe I did look at issues and you can actually see where this has been occurring as well. Fortunately there is no signs of infection with regard to the toe necessarily although it is very swollen compared to normal that does have me somewhat concerned about the possibility of further evaluation for infection/osteomyelitis. I would recommend an x-ray to start with. Otherwise he states that it is been 1-2 weeks that he has  had the draining in regard to left lower extremity he does not know how this happened he has been wearing his compression appropriately and I do see that as well today but nonetheless I do think that he needs to continue to be very cautious with regard to elevation as well. Adam Merritt, Adam Merritt ([id number]) 03/25/2019 on evaluation today patient appears to be doing much better in regard to his lower extremity at this time. That is on the left. He did culture positive for Pseudomonas but the good news is he seems to be doing much better the gentamicin we applied topically seems to have done a  great job for him. There is no signs of active infection at this time systemically and locally he is doing much better. No fevers, chills, nausea, vomiting, or diarrhea. In regard to his right toe this seems to be doing much better there is very little pressure noted at this point I did clean up some of the callus today around the edges of the wound as well as the surface of the wound but to be honest he is progressing quite nicely. 10/30; the area on the left anterior lower tibia area is healed over. His edema control is adequate even though his use of the compression pumps seems very intermittent. He has a juxta lite stocking on the right leg and a think there is 1 for the left leg at home. His wife is putting these on. He has an area on the plantar right second toe which is a hammertoe they are trying to offload this 04/08/2019 on evaluation today patient appears to be doing well in regard to his lower extremities which is good news. We are seeing him today for his toe ulcer which is giving him a little bit more trouble but again still seems to be doing better at this point which is good news. He is going require some sharp debridement in regard to the toe today however. 04/15/2019 on evaluation today patient actually appears to be doing quite well with regard to his toe ulcer. I am very pleased with how things are doing in that regard. With regard to his lower extremity edema in general he tells me he is not been using the lymphedema pumps regularly although he does not use them. I think that he needs to use them more regularly he does have a small blister on the left lower extremity this is not open at this point but obviously it something that can get worse if he does not keep the compression going and the pumps going as well. 04/22/2019 on evaluation today patient appears to be doing well with regard to his toe ulcer. He has been tolerating the dressing changes without complication. This is  measuring slightly smaller this week as compared to last week. Fortunately there is no signs of active infection at this time. 05/06/2019 on evaluation today patient actually appears to be doing excellent. In fact his toe appears to be completely healed which is great news. There is no signs of active infection at this time. Readmission: Patient presents today for follow-up after having been discharged at the beginning of December 2020. He states that in recent weeks things have reopened and has been having more trouble at this point. With that being said fortunately there is no signs of active infection at this time. No fever chills noted. Nonetheless he also does have an issue with one of his toes as well that seems to be somewhat this is  on the right foot second toe. 07/25/2019 upon evaluation today patient's lower extremities appear to be doing excellent bilaterally. I really feel like he is showing signs of great improvement and in fact I think he is close to healing which is great news. There is no signs of active infection at this time. No fevers, chills, nausea, vomiting, or diarrhea. 07/31/2019 upon evaluation today patient appears to be doing excellent and in fact I think may be completely healed in regard to his right lower extremity that were still to monitor this 1 more week before closing it out. The left lower extremity is measuring smaller although not completely closed seems to be doing excellent. 08/07/2019 upon evaluation today patient appears to be doing excellent in regard to his wounds. In fact the right lower extremity is completely healed there is no issues here. The left lower extremity has 1 very small area still remaining fortunately there is no signs of infection and overall I think he is very close to closure of the here as well. 08/14/19 upon evaluation today patient appears to be doing excellent in regard to his lower extremities. In fact he appears to be completely  healed as of today. Fortunately there's no signs of active infection at this time. No fevers, chills, nausea, or vomiting noted at this time. READMISSION 09/26/2019 This is a 74 year old man who is well-known to this clinic having just been discharged on 08/14/2019. He has known lymphedema and chronic venous insufficiency. He has Farrow wrap stockings and also external compression pumps. He does not use the external compression pumps however. He does however apparently fairly faithfully use the stockings. They have not been lotion in his legs. He has developed new wounds on the right anterior as well as the left medial left lateral and left posterior calf. He returns for our review of this Past medical history includes coronary artery disease, hypertension, congestive heart failure, COPD, venous insufficiency with lymphedema, type 2 diabetes. He apparently has compression pumps, Farrow wraps and at 1 point a hospital bed although I believe he sleeps in a recliner 10/03/2019 upon evaluation today patient appears to be doing better with regard to his wounds in general. He has been tolerating the dressing changes without complication. Fortunately there is no signs of active infection. No fevers, chills, nausea, vomiting, or diarrhea. I do believe the compression wraps are helping as they always have in the past. 10/10/2019 upon evaluation today patient appears to be doing excellent at this point. His right lower extremity is measuring much better and in fact is completely healed. His left lower extremity is also measuring better though not healed seems to be doing excellent at this point which is great news. Overall I am very pleased with how things appear currently. 10/27/2019 upon evaluation today patient appears to be doing quite well with regard to his wounds currently. He has been tolerating the dressing changes without complication. Fortunately there is no signs of active infection at this time. In  fact he is almost completely healed on the left and has just a very small area open and on the right he is completely healed. 11/04/2019 upon evaluation today patient appears to be doing quite well with regard to his wounds. In fact he appears to be quite possibly completely healed on the left lower extremity now as well the right lower extremity is still doing well. With that being said unfortunately though this may be healed I think it is a very fragile area and I  cannot even confirm there is not a very small opening still remaining. I really think he may benefit from 1 additional weeks wrapping before discontinuing. Electronic Signature(s) Signed: 11/04/2019 12:42:47 PM By: Worthy Keeler PA-C Entered By: Worthy Keeler on 11/04/2019 12:42:46 Adam Merritt ([id number]) Adam Merritt, Adam Merritt ([id number]) -------------------------------------------------------------------------------- Physical Exam Details Patient Name: Adam Merritt Date of Service: 11/04/2019 11:00 AM Medical Record Number: [id number] Patient Account Number: [id number] Date of Birth/Sex: Aug 09, 1945 (74 y.o. M) Treating RN: Montey Hora Primary Care Provider: [name] Other Clinician: Referring Provider: [name] Treating Provider/Extender: STONE III, Deliliah Spranger [name]: 5 Constitutional Well-nourished and well-hydrated in no acute distress. Respiratory normal breathing without difficulty. Psychiatric this patient is able to make decisions and demonstrates good insight into disease process. Alert and Oriented x 3. pleasant and cooperative. Notes Upon inspection today patient appears to be doing decently well with regard to his wounds. In fact the leg on the left appears to be almost completely healed although there may be a small opening in one area where he has new skin I think he would benefit from 1 additional weeks wrapping before discontinuing. Electronic Signature(s) Signed: 11/04/2019 12:43:08 PM By: Worthy Keeler PA-C Entered By: Worthy Keeler on 11/04/2019 12:43:07 Adam Merritt ([id number]) -------------------------------------------------------------------------------- Physician Orders Details Patient Name: Adam Merritt Date of Service: 11/04/2019 11:00 AM Medical Record Number: [id number] Patient Account Number: [id number] Date of Birth/Sex: [birth year] (74 y.o. M) Treating RN: Montey Hora Primary Care Provider: [name] Other Clinician: Referring Provider: [name] Treating Provider/Extender: Melburn Hake, Abbye Lao [name]: 5 Verbal / Phone Orders: No Diagnosis Coding ICD-10 Coding Code Description I89.0 Lymphedema, not elsewhere classified L97.222 Non-pressure chronic ulcer of left calf with fat layer exposed L97.812 Non-pressure chronic ulcer of other part of right lower leg with fat layer exposed Wound Cleansing o May shower with protection. - Do not get your wrap wet Dressing Change Frequency o Change dressing every week Follow-up Appointments o Return Appointment in 1 week. Edema Control o 4-Layer Compression System - Left Lower Extremity. o Elevate legs to the level of the heart and pump ankles as often as possible o Compression Pump: Use compression pump on left lower extremity for 60 minutes, twice daily. o Compression Pump: Use compression pump on right lower extremity for 60 minutes, twice daily. Electronic Signature(s) Signed: 11/04/2019 3:51:15 PM By: Worthy Keeler PA-C Signed: 11/04/2019 4:28:28 PM By: Montey Hora Entered By: Montey Hora on 11/04/2019 11:26:23 Adam Merritt ([id number]) -------------------------------------------------------------------------------- Problem List Details Patient Name: Adam Merritt Date of Service: 11/04/2019 11:00 AM Medical Record Number: [id number] Patient Account Number: [id number] Date of Birth/Sex: [birth year] (74 y.o. M) Treating RN: Montey Hora Primary Care Provider: [name]  Other Clinician: Referring Provider: [name] Treating Provider/Extender: Melburn Hake, Jamaris Theard [name]: 5 Active Problems ICD-10 Encounter Code Description Active Date MDM Diagnosis I89.0 Lymphedema, not elsewhere classified 09/26/2019 No Yes L97.222 Non-pressure chronic ulcer of left calf with fat layer exposed 09/26/2019 No Yes L97.812 Non-pressure chronic ulcer of other part of right lower leg with fat layer 09/26/2019 No Yes exposed Inactive Problems Resolved Problems Electronic Signature(s) Signed: 11/04/2019 11:09:09 AM By: Worthy Keeler PA-C Entered By: Worthy Keeler on 11/04/2019 11:09:08 Adam Merritt ([id number]) -------------------------------------------------------------------------------- Progress Note Details Patient Name: Adam Merritt Date of Service: 11/04/2019 11:00 AM Medical Record Number: [id number] Patient Account Number: [id number] Date of Birth/Sex: 07/10/1945 (74 y.o. M) Treating RN: Montey Hora Primary  Care Provider: [name] Other Clinician: Referring Provider: [name] Treating Provider/Extender: Melburn Hake, Farren Nelles [name]: 5 Subjective Chief Complaint Information obtained from Patient Bilateral LE Ulcers and right second toe ulcer 09/26/2019; patient is here for review bilateral lower leg wounds. He is well familiar to this clinic History of Present Illness (HPI) 10/18/17-He is here for initial evaluation of bilateral lower extremity ulcerations in the presence of venous insufficiency and lymphedema. He has been seen by vascular medicine in the past, Dr. Lucky Cowboy, last seen in 2016. He does have a history of abnormal ABIs, which is to be expected given his lymphedema and venous insufficiency. According to Epic, it appears that all attempts for arterial evaluation and/or angiography were not follow through with by patient. He does have a history of being seen in lymphedema clinic in 2018, stopped going approximately 6 months  ago stating "it didn't do any good". He does not have lymphedema pumps, he does not have custom fit compression wrap/stockings. He is diabetic and his recent A1c last month was 7.6. He admits to chronic bilateral lower extremity pain, no change in pain since blister and ulceration development. He is currently being treated with Levaquin for bronchitis. He has home health and we will continue. 10/25/17-He is here in follow-up evaluation for bilateral lower extremity ulcerationssubtle he remains on Levaquin for bronchitis. Right lower extremity with no evidence of drainage or ulceration, persistent left lower extremity ulceration. He states that home health has not been out since his appointment. He went to [facility] on Tuesday, studies revealed: RIGHT ABI 0.9, TBI 0.6 LEFT ABI 1.1, TBI 0.6 with triphasic flow bilaterally. We will continue his same treatment plan. He has been educated on compression therapy and need for elevation. He will benefit from lymphedema pumps 11/01/17-He is here in follow-up evaluation for left lower extremity ulcer. The right lower extremity remains healed. He has home health services, but they have not been out to see the patient for 2-3 weeks. He states it home health physical therapy changed his dressing yesterday after therapy; he placed Ace wrap compression. We are still waiting for lymphedema pumps, reordered d/t need for company change. 11/08/17-He is here in follow-up evaluation for left lower extremity ulcer. It is improved. Edema is significantly improved with compression therapy. We will continue with same treatment plan and he will follow-up next week. No word regarding lymphedema pumps 11/15/17-He is here in follow-up evaluation for left lower extremity ulcer. He is healed and will be discharged from wound care services. I have reached out to [facility] regarding his lymphedema pumps. They have been unable to reach the patient; the contact  number they had with the patient's wife's cell phone and she has not answered any unrecognized calls. Contact should be made today, trial planned for next week; [facility] will continue to follow 11/27/17 on evaluation today patient has multiple blistered areas over the right lower extremity his left lower extremity appears to be doing okay. These blistered areas show signs of no infection which is great news. With that being said he did have some necrotic skin overlying which was mechanically debrided away with saline and gauze today without complication. Overall post debridement the wounds appear to be doing better but in general his swelling seems to be increased. This is obviously not good news. I think this is what has given rise to the blisters. 12/04/17 on evaluation today patient presents for follow-up concerning his bilateral lower extremity edema in the  right lower extremity ulcers. He has been tolerating the dressing changes without complication. With that being said he has had no real issues with the wraps which is also good news. Overall I'm pleased with the progress he's been making. 12/11/17 on evaluation today patient appears to be doing rather well in regard to his right lateral lower extremity ulcer. He's been tolerating the dressing changes without complication. Fortunately there does not appear to be any evidence of infection at this time. Overall I'm pleased with the progress that is being made. Unfortunately he has been in the hospital due to having what sounds to be a stomach virus/flu fortunately that is starting to get better. 12/18/17 on evaluation today patient actually appears to be doing very well in regard to his bilateral lower extremities the swelling is under fairly good control his lymphedema pumps are still not up and running quite yet. With that being said he does have several areas of opening noted as far as wounds are concerned mainly over the left lower  extremity. With that being said I do believe once he gets lymphedema pumps this would least help you mention some the fluid and preventing this from occurring. Hopefully that will be set up soon sleeves are Artie in place at his home he just waiting for the machine. 12/25/17 on evaluation today patient actually appears to be doing excellent in fact all of his ulcers appear to have resolved his legs appear very well. I do think he needs compression stockings we have discussed this and they are actually going to go to [place] today to elastic therapy to get this fitted for him. I think that is definitely a good thing to do. Readmission: 04/09/18 upon evaluation today this patient is seen for readmission due to bilateral lower extremity lymphedema. He has significant swelling of his extremities especially on the left although the right is also swollen he has weeping from both sides. There are no obvious open wounds at this point. Fortunately he has been doing fairly well for quite a bit of time since I last saw him. Nonetheless unfortunately this seems to have reopened and is giving quite a bit of trouble. He states this began about a week ago when he first called [place] to get in to be seen. No fevers, chills, nausea, or vomiting noted at this time. He has not been using his lymphedema pumps due to the fact that they won't fit on his leg at this point likewise is also not been using his compression for essentially the same reason. 04/16/18 upon evaluation today patient actually appears to be doing a little better in regard to the fluid in his bilateral lower extremities. With that being said he's had three falls since I saw him last week. He also states that he's been feeling very poorly. I was concerned last week and feel like that the concern is still there as far as the congestion in his chest is concerned he seems to be breathing about the same as last week but again he states he's very weak he's not  even able to walk further than from the chair to the door. His wife had to buy a wheelchair just to be able to get them out of the house to get to the appointment today. This has me very concerned. Adam Merritt, Adam Merritt ([id number]) 04/23/18 on evaluation today patient actually appears to be doing much better than last week's evaluation. At that time actually had to transport him to the ER  via EMS and he subsequently was admitted for acute pulmonary edema, acute renal failure, and acute congestive heart failure. Fortunately he is doing much better. Apparently they did dialyze him and were able to take off roughly 35 pounds of fluid. Nonetheless he is feeling much better both in regard to his breathing and he's able to get around much better at this time compared to previous. Overall I'm very happy with how things are at this time. There does not appear to be any evidence of infection currently. No fevers, chills, nausea, or vomiting noted at this time. 04/30/2018 patient seen today for follow-up and management of bilateral lower extremity lymphedema. He did express being more sad today than usual due to the recent loss of his dog. He states that he has been compliant with using the lymphedema pumps. However he does admit a minute over the last 2-3 days he has not been using the pumps due to the recent loss of his dog. At this time there is no drainage or open wounds to his lower extremities. The left leg edema is measuring smaller today. Still has a significant amount of edema on bilateral lower extremities With dry flaky skin. He will be referred to the lymphedema clinic for further management. Will continue 3 layer compression wraps and follow-up in 1-2 weeks.Denies any pain, fever, chairs recently. No recent falls or injuries reported during this visit. 05/07/18 on evaluation today patient actually appears to be doing very well in regard to his lower extremities in general all things considered. With that  being said he is having some pain in the legs just due to the amount of swelling. He does have an area where he had a blister on the left lateral lower extremity this is open at this point other than that there's nothing else weeping at this time. 05/14/18 on evaluation today patient actually appears to be doing excellent all things considered in regard to his lower extremities. He still has a couple areas of weeping on each leg which has continued to be the issue for him. He does have an appointment with the lymphedema clinic although this isn't until February 2020. That was the earliest they had. In the meantime he has continued to tolerate the compression wraps without complication. 05/28/18 on evaluation today patient actually appears to be doing more poorly in regard to his left lower extremity where he has a wound open at this point. He also had a fall where he subsequently injured his right great toe which has led to an open wound at the site unfortunately. He has been tolerating the dressing changes without complication in general as far as the wraps are concerned that he has not been putting any dressing on the left 1st toe ulcer site. 06/11/18 on evaluation today patient appears to be doing much worse in regard to his bilateral lower extremity ulcers. He has been tolerating the dressing changes without complication although his legs have not been wrapped more recently. Overall I am not very pleased with the way his legs appear. I do believe he needs to be back in compression wraps he still has not received his compression wraps from the [facility] hospital as of yet. 06/18/18 on evaluation today patient actually appears to be doing significantly better than last time I saw him. He has been tolerating the compression wraps without complication in the circumferential ulcers especially appear to be doing much better. His toe ulcer on the right in regard to the great toe is  better although not as good as  the legs in my pinion. No fevers chills noted 07/02/18 on evaluation today patient appears to be doing much better in regard to his lower extremity ulcers. Unfortunately since I last saw him he's had the distal portion of his right great toe if he dated it sounds as if this actually went downhill very quickly. I had only seen him a few days prior and the toe did not appear to be infected at that point subsequently became infected very rapidly and it was decided by the surgeon that the distal portion of the toe needed to be removed. The patient seems to be doing well in this regard he tells me. With that being said his lower extremities are doing better from the standpoint of the wounds although he is significantly swollen at this point. 07/09/18 on evaluation today patient appears to be doing better in regard to the wounds on his lower extremities. In fact everything is almost completely healed he is just a small area on the left posterior lower extremity that is open at this point. 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 With that being said he needs some kind of compression it does sound like he may not be wearing his compression, that is the wraps, during the entire time between when he's here visit to visit. Apparently his wife took the current one off because it began to "fall apart". 07/16/18 on evaluation today patient appears to be extremely swollen especially in regard to his left lower extremity unfortunately. He also has a new skin tear over the left lower extremity and there's a smaller area on the right lower extremity as well. Unfortunately this seems to be due in part to blistering and fluid buildup in his leg. He did get the reduction wraps that were ordered by the [facility] hospital for him to go to  lymphedema clinic. With that being said his wounds on the legs have not healed to the point to where they would likely accept them as a patient lymphedema clinic currently. We need to try to get this to heal. With that being said he's been taking his wraps off which is not doing him any favors at this point. In fact this is probably quite counterproductive compared to what needs to occur. We will likely need to increase to a four layer compression wrap and continue to also utilize elevation and he has to keep the wraps on not take them off as he's been doing currently hasn't had a wrap on since Saturday. 07/23/18 on evaluation today patient appears to be doing much better in regard to his bilateral lower extremities. In fact his left lower extremity which was the largest is actually 15 cm smaller today compared to what it was last time he was here in our clinic. This is obviously good news after just one week. Nonetheless the differences he actually kept the wraps on during the entire week this time. That's not typical for him. I do believe he understands a little bit better now the severity of the situation and why it's important for him to keep these wraps on. 07/30/18 on evaluation today patient actually appears to be doing rather well in regard to his lower extremities. His legs are much smaller than they have been in the past and  he actually has only one very small rudder superficial region remaining that is not closed on the left lateral/posterior lower extremity even this is almost completely close. I do believe likely next week he will be healed without any complications. I do think we need to continue the wraps however this seems to be beneficial for him. I also think it may be a good time for [place] to go ahead and see about getting the appointment with the lymphedema clinic which is supposed to be made for him in order to keep this moving along and hopefully get them into compression wraps that  will in the end help him to remain healed. 08/06/18 on evaluation today patient actually appears to be doing very well in regard as bilateral lower extremities. In fact his wounds appear to be completely healed at this time. 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 He is in the process of getting appointment with the lymphedema clinic we have made this referral were just waiting to hear back on the schedule time. We need to follow up on that today as well. 08/13/18 on evaluation today patient actually appears to be doing very well in regard to his bilateral lower extremities there are no open wounds at this point. We are gonna go ahead and see about ordering the Velcro compression wraps for him had a discussion with them about [place] doing it versus the [place] they feel like they can definitely afford going ahead and get the wraps themselves and they would prefer to try to avoid having to go to the lymphedema clinic if it all possible which I completely understand. As long as he has good compression I'm okay either way. Adam Merritt, Adam Merritt ([id number]) 3/17//20 on evaluation today patient actually appears to be doing well in regard to his bilateral lower extremity ulcers. He has been tolerating the dressing changes without complication specifically the compression wraps. Overall is had no issues my fingers finding that I see at this point is that he is having trouble with constipation. He tells me he has not been able to go to the bathroom for about six days. He's taken to over-the- counter oral laxatives unfortunately this is not helping. He has not contacted his doctor. 08/27/18 on evaluation today patient appears to be doing fairly well in regard to his lower extremities at this point. There does not appear to be any new altars is swelling is very well controlled. We are still waiting for his Velcro compression wraps to arrive that should be sometime in  the next week is Artie sent the check for these. 09/03/18 on evaluation today patient appears to be doing excellent in regard to his bilateral lower extremities which he shows no signs of wound openings in regard to this point. He does have his Velcro compression wraps which did arrive in the mail since I saw him last week. Overall he is doing excellent in my pinion. 09/13/18 on evaluation today patient appears to be doing very well currently in regard to the overall appearance of his bilateral lower extremities although he's a little bit more swollen than last time we saw him. At that point he been discharged without any open wounds. Nonetheless he has a small open wound on the posterior left lower extremity with some evidence of cellulitis noted as well. Fortunately I feel like he has made good progress overall with regard to his lower extremities from were things used to be. 09/20/18  on evaluation today patient actually appears to be doing much better. The erythematous lower extremity is improving wound itself which is still open appears to be doing much better as far as but appearance as well as pain is concerned overall very pleased in this regard. There's no signs of active infection at this time. 09/27/18 on evaluation today patient's wounds on the lower extremity actually appear to be doing fairly well at this time which is good news. There is no evidence of active infection currently and again is just as left lower extremity were there any wounds at this point anyway. I believe they may be completely healed but again I'm not 100% sure based on evaluation today. I think one more week of observation would likely be a good idea. 10/04/18 on evaluation today patient actually appears to be doing excellent in regard to the left lower extremity which actually appears to be completely healed as of today. Unfortunately he's been having issues with his right lower extremity have a new wound that has  opened. Fortunately there's no evidence of active infection at this time which is good news. 10/10/18 upon evaluation today patient actually appears to be doing a little bit worse with the new open area on his right posterior lower extremity. He's been tolerating the dressing changes without complication. Right now we been using his compression wraps although I think we may need to switch back to actually performing bilateral compression wraps are in the clinic. No fevers, chills, nausea, or vomiting noted at this time. 10/17/18 on evaluation today patient actually appears to be doing quite well in regard to his bilateral lower extremity ulcers. He has been tolerating the reps without complication although he would prefer not to rewrap his legs as of today. Fortunately there's no signs of active infection which is good news. No fevers, chills, nausea, or vomiting noted at this time. 10/24/18 on evaluation today patient appears to be doing very well in regard to his lower extremities. His right lower extremity is shown signs of healing and his left lower Trinity though not healed appears to be improving which is excellent news. Overall very pleased with how things seem to be progressing at this time. The patient is likewise happy to hear this. His Velcro compression wraps however have not been put on properly will gonna show his wife how to do that properly today. 10/31/18 on evaluation today patient appears to be doing more poorly in regard to his left lower extremity in particular although both lower extremities actually are showing some signs of being worse than my previous evaluation. Unfortunately I'm just not sure that his compression stockings even with the use of the compression/lymphedema pumps seem to be controlling this well. Upon further questioning he tells me that he also is not able to lie flat in the bed due to his congestive heart failure and difficulty breathing. For that reason he sleeps  in his recliner. To make matters worse his recliner also cannot even hold his legs up so instead of even being somewhat elevated they pretty much hang down to the floor. This is the way he sleeps each night which is definitely counterproductive to everything else that were attempting to do from the standpoint of controlling his fluid. Nonetheless I think that he potentially could benefit from a hospital bed although this would be something that his primary care provider would likely have to order since anything that is order on our side has to be directly related to  wound care and again the hospital bed is not necessarily a direct relation to although I think it does contribute to his overall wound status on his lower extremities. 11/07/18 on evaluation today patient appears to be doing better in regard to his bilateral lower extremity. He's been tolerating the dressing changes without complication. We did in the interim since I last saw him switch to just using extras orbiting new alginate and that seems to have done much better for him. I'm very pleased with the overall progress that is made. 11/14/18 on evaluation today patient appears to be redoing rather well in regard to his left lower extremity ulcers which are the only ones remaining at this point. Fortunately there's no signs of active infection at this time which is good news. Overall been very pleased with how things seem to be progressing currently. No fevers, chills, nausea, or vomiting noted at this time. 11/20/17 on evaluation today patient actually appears to be doing quite well in regard to his lower extremities on the left on the right he has several blisters that showed up although there's some question about whether or not he has had his broker compression wraps on like he was supposed to or not. Fortunately there's no signs of active infection at this time which is good news. Unfortunately though he is doing well from the standpoint  of the left leg the right leg is not doing as well again this is when we did not wrap last week. 11/28/18 on evaluation today patient appears to be doing well in regard to his bilateral lower extremities is left appears to be healed is right is not healed but is very close to being so. Overall very pleased with how things seem to be progressing. Patient is likewise happy that things are doing well. 12/09/18 on evaluation today patient actually appears to be completely healed which is excellent news. He actually seems to be doing well in regard to the swelling of the bilateral lower extremities which is also great news. Overall very pleased with how things seem to be progressing. Readmission: 03/18/2019 upon evaluation today patient presents for reevaluation concerning issues that he is having with his left lower extremity where he does have a wound noted upon inspection today. He also has a wound on the right second toe on the tip where it is apparently been rubbing in his shoe I did look at issues and you can actually see where this has been occurring as well. Fortunately there is no signs of infection with regard to Adam Merritt, Adam Merritt. ([id number]) the toe necessarily although it is very swollen compared to normal that does have me somewhat concerned about the possibility of further evaluation for infection/osteomyelitis. I would recommend an x-ray to start with. Otherwise he states that it is been 1-2 weeks that he has had the draining in regard to left lower extremity he does not know how this happened he has been wearing his compression appropriately and I do see that as well today but nonetheless I do think that he needs to continue to be very cautious with regard to elevation as well. 03/25/2019 on evaluation today patient appears to be doing much better in regard to his lower extremity at this time. That is on the left. He did culture positive for Pseudomonas but the good news is he seems to be doing  much better the gentamicin we applied topically seems to have done a great job for him. There is no signs of active  infection at this time systemically and locally he is doing much better. No fevers, chills, nausea, vomiting, or diarrhea. In regard to his right toe this seems to be doing much better there is very little pressure noted at this point I did clean up some of the callus today around the edges of the wound as well as the surface of the wound but to be honest he is progressing quite nicely. 10/30; the area on the left anterior lower tibia area is healed over. His edema control is adequate even though his use of the compression pumps seems very intermittent. He has a juxta lite stocking on the right leg and a think there is 1 for the left leg at home. His wife is putting these on. He has an area on the plantar right second toe which is a hammertoe they are trying to offload this 04/08/2019 on evaluation today patient appears to be doing well in regard to his lower extremities which is good news. We are seeing him today for his toe ulcer which is giving him a little bit more trouble but again still seems to be doing better at this point which is good news. He is going require some sharp debridement in regard to the toe today however. 04/15/2019 on evaluation today patient actually appears to be doing quite well with regard to his toe ulcer. I am very pleased with how things are doing in that regard. With regard to his lower extremity edema in general he tells me he is not been using the lymphedema pumps regularly although he does not use them. I think that he needs to use them more regularly he does have a small blister on the left lower extremity this is not open at this point but obviously it something that can get worse if he does not keep the compression going and the pumps going as well. 04/22/2019 on evaluation today patient appears to be doing well with regard to his toe ulcer. He has  been tolerating the dressing changes without complication. This is measuring slightly smaller this week as compared to last week. Fortunately there is no signs of active infection at this time. 05/06/2019 on evaluation today patient actually appears to be doing excellent. In fact his toe appears to be completely healed which is great news. There is no signs of active infection at this time. Readmission: Patient presents today for follow-up after having been discharged at the beginning of December 2020. He states that in recent weeks things have reopened and has been having more trouble at this point. With that being said fortunately there is no signs of active infection at this time. No fever chills noted. Nonetheless he also does have an issue with one of his toes as well that seems to be somewhat this is on the right foot second toe. 07/25/2019 upon evaluation today patient's lower extremities appear to be doing excellent bilaterally. I really feel like he is showing signs of great improvement and in fact I think he is close to healing which is great news. There is no signs of active infection at this time. No fevers, chills, nausea, vomiting, or diarrhea. 07/31/2019 upon evaluation today patient appears to be doing excellent and in fact I think may be completely healed in regard to his right lower extremity that were still to monitor this 1 more week before closing it out. The left lower extremity is measuring smaller although not completely closed seems to be doing excellent. 08/07/2019 upon evaluation  today patient appears to be doing excellent in regard to his wounds. In fact the right lower extremity is completely healed there is no issues here. The left lower extremity has 1 very small area still remaining fortunately there is no signs of infection and overall I think he is very close to closure of the here as well. 08/14/19 upon evaluation today patient appears to be doing excellent in regard to  his lower extremities. In fact he appears to be completely healed as of today. Fortunately there's no signs of active infection at this time. No fevers, chills, nausea, or vomiting noted at this time. READMISSION 09/26/2019 This is a 74 year old man who is well-known to this clinic having just been discharged on 08/14/2019. He has known lymphedema and chronic venous insufficiency. He has Farrow wrap stockings and also external compression pumps. He does not use the external compression pumps however. He does however apparently fairly faithfully use the stockings. They have not been lotion in his legs. He has developed new wounds on the right anterior as well as the left medial left lateral and left posterior calf. He returns for our review of this Past medical history includes coronary artery disease, hypertension, congestive heart failure, COPD, venous insufficiency with lymphedema, type 2 diabetes. He apparently has compression pumps, Farrow wraps and at 1 point a hospital bed although I believe he sleeps in a recliner 10/03/2019 upon evaluation today patient appears to be doing better with regard to his wounds in general. He has been tolerating the dressing changes without complication. Fortunately there is no signs of active infection. No fevers, chills, nausea, vomiting, or diarrhea. I do believe the compression wraps are helping as they always have in the past. 10/10/2019 upon evaluation today patient appears to be doing excellent at this point. His right lower extremity is measuring much better and in fact is completely healed. His left lower extremity is also measuring better though not healed seems to be doing excellent at this point which is great news. Overall I am very pleased with how things appear currently. 10/27/2019 upon evaluation today patient appears to be doing quite well with regard to his wounds currently. He has been tolerating the dressing changes without complication. Fortunately  there is no signs of active infection at this time. In fact he is almost completely healed on the left and has just a very small area open and on the right he is completely healed. 11/04/2019 upon evaluation today patient appears to be doing quite well with regard to his wounds. In fact he appears to be quite possibly completely healed on the left lower extremity now as well the right lower extremity is still doing well. With that being said unfortunately though this may be healed I think it is a very fragile area and I cannot even confirm there is not a very small opening still remaining. I really think he may benefit from 1 additional weeks wrapping before discontinuing. Adam Merritt, Adam Merritt ([id number]) Objective Constitutional Well-nourished and well-hydrated in no acute distress. Vitals Time Taken: 11:10 AM, Temperature: 98.4 F, Pulse: 73 bpm, Respiratory Rate: 16 breaths/min, Blood Pressure: 160/69 mmHg. Respiratory normal breathing without difficulty. Psychiatric this patient is able to make decisions and demonstrates good insight into disease process. Alert and Oriented x 3. pleasant and cooperative. General Notes: Upon inspection today patient appears to be doing decently well with regard to his wounds. In fact the leg on the left appears to be almost completely healed although there may be  a small opening in one area where he has new skin I think he would benefit from 1 additional weeks wrapping before discontinuing. Integumentary (Hair, Skin) Wound #32 status is Healed - Epithelialized. Original cause of wound was Blister. The wound is located on the Left,Lateral Lower Leg. The wound measures 0cm length x 0cm width x 0cm depth; 0cm^2 area and 0cm^3 volume. The wound is limited to skin breakdown. There is no tunneling or undermining noted. There is a none present amount of drainage noted. There is no granulation within the wound bed. There is no necrotic tissue within the wound  bed. 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 Post procedure Diagnosis Wound #: Same as Pre-Procedure Plan Wound Cleansing: May shower with protection. - Do not get your wrap wet Dressing Change Frequency: Change dressing every week Follow-up Appointments: Return Appointment in 1 week. Edema Control: 4-Layer Compression System - Left Lower Extremity. Elevate legs to the level of the heart and pump ankles as often as possible Compression Pump: Use compression pump on left lower extremity for 60 minutes, twice daily. Compression Pump: Use compression pump on right lower extremity for 60 minutes, twice daily. Adam Merritt, Adam Merritt. ([id number]) 1. My suggestion at this time is can be that we go ahead and continue with the current compression wrap for left lower extremity. I think this is probably still the best option for the patient he is in agreement with that plan. Hopefully as of next week will be able to discharge him as he will. 2. I am also can recommend that he continue to elevate his legs much as possible. Obviously in the morning keep his legs elevated better also recommend he should be using his lymphedema pumps as well. We will see patient back for reevaluation in 1 week here in the clinic. If anything worsens or changes patient will contact our office for additional recommendations. Electronic Signature(s) Signed: 11/04/2019 1:00:31 PM By: Worthy Keeler PA-C Entered By: Worthy Keeler on 11/04/2019 13:00:31 Adam Merritt ([id number]) -------------------------------------------------------------------------------- SuperBill Details Patient Name: Adam Merritt Date of Service: 11/04/2019 Medical Record Number:  [id number] Patient Account Number: [id number] Date of Birth/Sex: 06-Aug-1945 (74 y.o. M) Treating RN: Montey Hora Primary Care Provider: [name] Other Clinician: Referring Provider: [name] Treating Provider/Extender: Melburn Hake, Jadelin Eng [name]: 5 Diagnosis Coding ICD-10 Codes Code Description I89.0 Lymphedema, not elsewhere classified L97.222 Non-pressure chronic ulcer of left calf with fat layer exposed L97.812 Non-pressure chronic ulcer of other part of right lower leg with fat layer exposed Facility Procedures CPT4 Code: 52841324 Description: (Facility Use Only) 29581LT - [place] LWR LT LEG Modifier: Quantity: 1 Physician Procedures CPT4 Code: 4010272 Description: 53664 - WC PHYS LEVEL 3 - EST PT Modifier: Quantity: 1 CPT4 Code: Description: ICD-10 Diagnosis Description I89.0 Lymphedema, not elsewhere classified L97.222 Non-pressure chronic ulcer of left calf with fat layer exposed L97.812 Non-pressure chronic ulcer of other part of right lower leg with fat la Modifier: yer exposed Quantity: Electronic Signature(s) Signed: 11/04/2019 1:00:52 PM By: Worthy Keeler PA-C Previous Signature: 11/04/2019 11:38:06 AM Version By: Montey Hora Entered By: Worthy Keeler on 11/04/2019 13:00:51

## 2019-11-05 DIAGNOSIS — I509 Heart failure, unspecified: Secondary | ICD-10-CM | POA: Diagnosis not present

## 2019-11-10 ENCOUNTER — Telehealth: Payer: Self-pay

## 2019-11-10 ENCOUNTER — Other Ambulatory Visit: Payer: Self-pay | Admitting: Nurse Practitioner

## 2019-11-10 ENCOUNTER — Other Ambulatory Visit: Payer: Self-pay

## 2019-11-10 ENCOUNTER — Encounter: Payer: Medicare PPO | Admitting: Physician Assistant

## 2019-11-10 DIAGNOSIS — L97812 Non-pressure chronic ulcer of other part of right lower leg with fat layer exposed: Secondary | ICD-10-CM | POA: Diagnosis not present

## 2019-11-10 DIAGNOSIS — E669 Obesity, unspecified: Secondary | ICD-10-CM | POA: Diagnosis not present

## 2019-11-10 DIAGNOSIS — I509 Heart failure, unspecified: Secondary | ICD-10-CM | POA: Diagnosis not present

## 2019-11-10 DIAGNOSIS — I890 Lymphedema, not elsewhere classified: Secondary | ICD-10-CM | POA: Diagnosis not present

## 2019-11-10 DIAGNOSIS — J209 Acute bronchitis, unspecified: Secondary | ICD-10-CM

## 2019-11-10 DIAGNOSIS — E1151 Type 2 diabetes mellitus with diabetic peripheral angiopathy without gangrene: Secondary | ICD-10-CM | POA: Diagnosis not present

## 2019-11-10 DIAGNOSIS — I2510 Atherosclerotic heart disease of native coronary artery without angina pectoris: Secondary | ICD-10-CM | POA: Diagnosis not present

## 2019-11-10 DIAGNOSIS — L97222 Non-pressure chronic ulcer of left calf with fat layer exposed: Secondary | ICD-10-CM | POA: Diagnosis not present

## 2019-11-10 DIAGNOSIS — J440 Chronic obstructive pulmonary disease with acute lower respiratory infection: Secondary | ICD-10-CM

## 2019-11-10 DIAGNOSIS — E11622 Type 2 diabetes mellitus with other skin ulcer: Secondary | ICD-10-CM | POA: Diagnosis not present

## 2019-11-10 DIAGNOSIS — I110 Hypertensive heart disease with heart failure: Secondary | ICD-10-CM | POA: Diagnosis not present

## 2019-11-10 DIAGNOSIS — G4730 Sleep apnea, unspecified: Secondary | ICD-10-CM | POA: Diagnosis not present

## 2019-11-10 MED ORDER — CEFUROXIME AXETIL 500 MG PO TABS: 500.0000 mg | ORAL_TABLET | Freq: Two times a day (BID) | ORAL | 0 refills | Status: DC

## 2019-11-10 NOTE — Progress Notes (Signed)
Start prescription for ceftin 500mg  which is twice daily for next 10 days. He should use his neb treatments as needed and as prescribed. Let us know if symptoms do not improve over next few days.

## 2019-11-10 NOTE — Telephone Encounter (Signed)
Pt wife was notified

## 2019-11-10 NOTE — Telephone Encounter (Signed)
Please let the patient know to Start prescription for ceftin 500mg  which is twice daily for next 10 days. He should use his neb treatments as needed and as prescribed. Let us know if symptoms do not improve over next few days. Thanks.

## 2019-11-10 NOTE — Progress Notes (Addendum)
APOLINAR, BERO ([id number]) Visit Report for 11/10/2019 Arrival Information Details Patient Name: Adam Merritt, Adam Merritt Date of Service: 11/10/2019 10:45 AM Medical Record Number: [id number] Patient Account Number: [id number] Date of Birth/Sex: 1945-11-06 (74 y.o. M) Treating RN: Army Melia Primary Care Mikaili Flippin: [name] Other Clinician: Referring Shannen Vernon: [name] Treating Tyera Hansley/Extender: Melburn Hake, [name]: 6 Visit Information History Since Last Visit Added or deleted any medications: No Patient Arrived: Walker Any new allergies or adverse reactions: No Arrival Time: 10:48 Had a fall or experienced change in No Accompanied By: wife activities of daily living that may affect Transfer Assistance: None risk of falls: Patient Identification Verified: Yes Signs or symptoms of abuse/neglect since last visito No Secondary Verification Process Completed: Yes Hospitalized since last visit: No Patient Has Alerts: Yes Implantable device outside of the clinic excluding No Patient Alerts: Patient on Blood Thinner cellular tissue based products placed in the center DMII since last visit: Aspirin 325 Has Dressing in Place as Prescribed: No Pain Present Now: No Electronic Signature(s) Signed: 11/10/2019 4:25:54 PM By: Lorine Bears RCP, RRT, CHT Entered By: Lorine Bears on 11/10/2019 10:56:42 Adam Merritt ([id number]) -------------------------------------------------------------------------------- Compression Therapy Details Patient Name: Adam Merritt Date of Service: 11/10/2019 10:45 AM Medical Record Number: [id number] Patient Account Number: [id number] Date of Birth/Sex: 1946/03/03 (74 y.o. M) Treating RN: Army Melia Primary Care Brynnleigh Mcelwee: [name] Other Clinician: Referring Terelle Dobler: [name] Treating Katheen Aslin/Extender: Melburn Hake, [name]: 6 Compression Therapy Performed for Wound Assessment: Wound #35 Left  Lower Leg Performed By: Clinician Army Melia, RN Compression Type: Four Layer Post Procedure Diagnosis Same as Pre-procedure Electronic Signature(s) Signed: 11/10/2019 12:56:57 PM By: Army Melia Entered By: Army Melia on 11/10/2019 11:38:56 Adam Merritt ([id number]) -------------------------------------------------------------------------------- Encounter Discharge Information Details Patient Name: Adam Merritt Date of Service: 11/10/2019 10:45 AM Medical Record Number: [id number] Patient Account Number: [id number] Date of Birth/Sex: [birth year] (74 y.o. M) Treating RN: Army Melia Primary Care Solomiya Pascale: [name] Other Clinician: Referring Christiana Gurevich: [name] Treating Alister Staver/Extender: Melburn Hake, [name]: 6 Encounter Discharge Information Items Discharge Condition: Stable Ambulatory Status: Cane Discharge Destination: Home Transportation: Private Auto Accompanied By: wife Schedule Follow-up Appointment: Yes Clinical Summary of Care: Electronic Signature(s) Signed: 11/10/2019 12:56:57 PM By: Army Melia Entered By: Army Melia on 11/10/2019 11:44:20 Adam Merritt ([id number]) -------------------------------------------------------------------------------- Lower Extremity Assessment Details Patient Name: Adam Merritt Date of Service: 11/10/2019 10:45 AM Medical Record Number: [id number] Patient Account Number: [id number] Date of Birth/Sex: 1945/07/24 (74 y.o. M) Treating RN: Montey Hora Primary Care Weiland Tomich: [name] Other Clinician: Referring Dago Jungwirth: [name] Treating Verne Lanuza/Extender: Melburn Hake, [name]: 6 Edema Assessment Assessed: [Left: No] [Right: No] Edema: [Left: Ye] [Right: s] Calf Left: Right: Point of Measurement: 31 cm From Medial Instep 61 cm cm Ankle Left: Right: Point of Measurement: 11 cm From Medial Instep 32 cm cm Vascular Assessment Pulses: Dorsalis Pedis Palpable: [Left:Yes] Electronic  Signature(s) Signed: 11/10/2019 4:03:27 PM By: Montey Hora Entered By: Montey Hora on 11/10/2019 11:22:51 Adam Merritt ([id number]) -------------------------------------------------------------------------------- Multi Wound Chart Details Patient Name: Adam Merritt Date of Service: 11/10/2019 10:45 AM Medical Record Number: [id number] Patient Account Number: [id number] Date of Birth/Sex: November 09, 1945 (74 y.o. M) Treating RN: Army Melia Primary Care Dannilynn Gallina: [name] Other Clinician: Referring Story Conti: [name] Treating Roe Wilner/Extender: Melburn Hake, [name]: 6 Vital Signs Height(in): Pulse(bpm): 72 Weight(lbs): Blood Pressure(mmHg): 191/73 Body Mass Index(BMI): Temperature(F): 98.3 Respiratory Rate(breaths/min): 20  Photos: [N/A:N/A] Wound Location: Left Lower Leg N/A N/A Wounding Event: Trauma N/A N/A Primary Etiology: Skin Tear N/A N/A Comorbid History: Anemia, Lymphedema, Chronic N/A N/A Obstructive Pulmonary Disease (COPD), Sleep Apnea, Congestive Heart Failure, Coronary Artery Disease, Hypertension, Peripheral Venous Disease, Type II Diabetes, Osteoarthritis, Neuropathy, Received Chemotherapy Date Acquired: 11/07/2019 N/A N/A Weeks of Treatment: 0 N/A N/A Wound Status: Open N/A N/A Measurements L x W x D (cm) 3.1x2.2x0.1 N/A N/A Area (cm) : 5.356 N/A N/A Volume (cm) : 0.536 N/A N/A Classification: Full Thickness Without Exposed N/A N/A Support Structures Exudate Amount: Medium N/A N/A Exudate Type: Serous N/A N/A Exudate Color: amber N/A N/A Wound Margin: Flat and Intact N/A N/A Granulation Amount: Large (67-100%) N/A N/A Granulation Quality: Pink N/A N/A Necrotic Amount: None Present (0%) N/A N/A Exposed Structures: Fat Layer (Subcutaneous Tissue) N/A N/A Exposed: Yes Fascia: No Tendon: No Muscle: No Joint: No Bone: No Epithelialization: None N/A N/A Treatment Notes Electronic Signature(s) Signed: 11/10/2019 12:56:57 PM By:  Army Melia Entered By: Army Melia on 11/10/2019 11:38:41 Adam Merritt ([id number]) Adam Merritt, Adam Merritt ([id number]) -------------------------------------------------------------------------------- Multi-Disciplinary Care Plan Details Patient Name: Adam Merritt Date of Service: 11/10/2019 10:45 AM Medical Record Number: [id number] Patient Account Number: [id number] Date of Birth/Sex: April 22, 1946 (74 y.o. M) Treating RN: Army Melia Primary Care Haylyn Halberg: [name] Other Clinician: Referring Rodriquez Thorner: [name] Treating Arlynn Mcdermid/Extender: Melburn Hake, [name]: 6 Active Inactive Abuse / Safety / Falls / Self Care Management Nursing Diagnoses: Potential for falls Goals: Patient will remain injury free related to falls Date Initiated: 09/26/2019 Target Resolution Date: 12/13/2019 Goal Status: Active Interventions: Assess fall risk on admission and as needed Notes: Electronic Signature(s) Signed: 11/10/2019 12:56:57 PM By: Army Melia Entered By: Army Melia on 11/10/2019 11:38:31 Adam Merritt ([id number]) -------------------------------------------------------------------------------- Pain Assessment Details Patient Name: Adam Merritt Date of Service: 11/10/2019 10:45 AM Medical Record Number: [id number] Patient Account Number: [id number] Date of Birth/Sex: 01/17/1946 (74 y.o. M) Treating RN: Montey Hora Primary Care Yarixa Lightcap: [name] Other Clinician: Referring Demetrice Combes: [name] Treating Lilianah Buffin/Extender: Melburn Hake, [name]: 6 Active Problems Location of Pain Severity and Description of Pain Patient Has Paino No Site Locations Pain Management and Medication Current Pain Management: Electronic Signature(s) Signed: 11/10/2019 4:03:27 PM By: Montey Hora Entered By: Montey Hora on 11/10/2019 11:17:31 Adam Merritt ([id number]) -------------------------------------------------------------------------------- Wound Assessment  Details Patient Name: Adam Merritt Date of Service: 11/10/2019 10:45 AM Medical Record Number: [id number] Patient Account Number: [id number] Date of Birth/Sex: [birth year] (74 y.o. M) Treating RN: Montey Hora Primary Care Emmerie Battaglia: [name] Other Clinician: Referring Noorah Giammona: [name] Treating Kikue Gerhart/Extender: Melburn Hake, [name]: 6 Wound Status Wound Number: 35 Primary Skin Tear Etiology: Wound Location: Left Lower Leg Wound Open Wounding Event: Trauma Status: Date Acquired: 11/07/2019 Comorbid Anemia, Lymphedema, Chronic Obstructive Pulmonary Weeks Of Treatment: 0 History: Disease (COPD), Sleep Apnea, Congestive Heart Failure, Clustered Wound: No Coronary Artery Disease, Hypertension, Peripheral Venous Disease, Type II Diabetes, Osteoarthritis, Neuropathy, Received Chemotherapy Photos Wound Measurements Length: (cm) 3.1 Width: (cm) 2.2 Depth: (cm) 0.1 Area: (cm) 5.356 Volume: (cm) 0.536 % Reduction in Area: % Reduction in Volume: Epithelialization: None Tunneling: No Undermining: No Wound Description Classification: Full Thickness Without Exposed Support Structu Wound Margin: Flat and Intact Exudate Amount: Medium Exudate Type: Serous Exudate Color: amber res Foul Odor After Cleansing: No Slough/Fibrino No Wound Bed Granulation Amount: Large (67-100%) Exposed Structure Granulation Quality: Pink Fascia Exposed: No Necrotic Amount: None Present (0%)  Fat Layer (Subcutaneous Tissue) Exposed: Yes Tendon Exposed: No Muscle Exposed: No Joint Exposed: No Bone Exposed: No Treatment Notes Wound #35 (Left Lower Leg) Notes scell, 4LL Adam Merritt, Adam Merritt ([id number]) Electronic Signature(s) Signed: 11/10/2019 4:03:27 PM By: Montey Hora Entered By: Montey Hora on 11/10/2019 11:24:58 Adam Merritt ([id number]) -------------------------------------------------------------------------------- [place] Details Patient Name: Adam Merritt Date  of Service: 11/10/2019 10:45 AM Medical Record Number: [id number] Patient Account Number: [id number] Date of Birth/Sex: January 29, 1946 (74 y.o. M) Treating RN: Army Melia Primary Care Seville Brick: [name] Other Clinician: Referring Nidhi Jacome: [name] Treating Saivon Prowse/Extender: Melburn Hake, [name]: 6 Vital Signs Time Taken: 10:50 Temperature (F): 98.3 Pulse (bpm): 72 Respiratory Rate (breaths/min): 20 Blood Pressure (mmHg): 191/73 Reference Range: 80 - 120 mg / dl Electronic Signature(s) Signed: 11/10/2019 4:25:54 PM By: Lorine Bears RCP, RRT, CHT Entered By: Lorine Bears on 11/10/2019 10:57:28

## 2019-11-10 NOTE — Progress Notes (Addendum)
JAYMIN, WALN ([id number]) Visit Report for 11/10/2019 Chief Complaint Document Details Patient Name: Adam Merritt, Adam Merritt Date of Service: 11/10/2019 10:45 AM Medical Record Number: [id number] Patient Account Number: [id number] Date of Birth/Sex: [birth year] (74 y.o. M) Treating RN: Army Melia Primary Care Provider: [name] Other Clinician: Referring Provider: [name] Treating Provider/Extender: Melburn Hake, Kaidyn Hernandes [name]: 6 Information Obtained from: Patient Chief Complaint Bilateral LE Ulcers and right second toe ulcer 09/26/2019; patient is here for review bilateral lower leg wounds. He is well familiar to this clinic Electronic Signature(s) Signed: 11/10/2019 10:56:28 AM By: Worthy Keeler PA-C Entered By: Worthy Keeler on 11/10/2019 10:56:28 Adam Merritt ([id number]) -------------------------------------------------------------------------------- HPI Details Patient Name: Adam Merritt Date of Service: 11/10/2019 10:45 AM Medical Record Number: [id number] Patient Account Number: [id number] Date of Birth/Sex: 1945-12-12 (74 y.o. M) Treating RN: Army Melia Primary Care Provider: [name] Other Clinician: Referring Provider: [name] Treating Provider/Extender: Melburn Hake, Mysty Kielty [name]: 6 History of Present Illness HPI Description: 10/18/17-He is here for initial evaluation of bilateral lower extremity ulcerations in the presence of venous insufficiency and lymphedema. He has been seen by vascular medicine in the past, Dr. Lucky Cowboy, last seen in 2016. He does have a history of abnormal ABIs, which is to be expected given his lymphedema and venous insufficiency. According to Epic, it appears that all attempts for arterial evaluation and/or angiography were not follow through with by patient. He does have a history of being seen in lymphedema clinic in 2018, stopped going approximately 6 months ago stating "it didn't do any good". He does not have lymphedema  pumps, he does not have custom fit compression wrap/stockings. He is diabetic and his recent A1c last month was 7.6. He admits to chronic bilateral lower extremity pain, no change in pain since blister and ulceration development. He is currently being treated with Levaquin for bronchitis. He has home health and we will continue. 10/25/17-He is here in follow-up evaluation for bilateral lower extremity ulcerationssubtle he remains on Levaquin for bronchitis. Right lower extremity with no evidence of drainage or ulceration, persistent left lower extremity ulceration. He states that home health has not been out since his appointment. He went to [facility] on Tuesday, studies revealed: RIGHT ABI 0.9, TBI 0.6 LEFT ABI 1.1, TBI 0.6 with triphasic flow bilaterally. We will continue his same treatment plan. He has been educated on compression therapy and need for elevation. He will benefit from lymphedema pumps 11/01/17-He is here in follow-up evaluation for left lower extremity ulcer. The right lower extremity remains healed. He has home health services, but they have not been out to see the patient for 2-3 weeks. He states it home health physical therapy changed his dressing yesterday after therapy; he placed Ace wrap compression. We are still waiting for lymphedema pumps, reordered d/t need for company change. 11/08/17-He is here in follow-up evaluation for left lower extremity ulcer. It is improved. Edema is significantly improved with compression therapy. We will continue with same treatment plan and he will follow-up next week. No word regarding lymphedema pumps 11/15/17-He is here in follow-up evaluation for left lower extremity ulcer. He is healed and will be discharged from wound care services. I have reached out to [facility] regarding his lymphedema pumps. They have been unable to reach the patient; the contact number they had with the patient's wife's cell phone and she has not  answered any unrecognized calls. Contact should be made today, trial planned  for next week; [facility] will continue to follow 11/27/17 on evaluation today patient has multiple blistered areas over the right lower extremity his left lower extremity appears to be doing okay. These blistered areas show signs of no infection which is great news. With that being said he did have some necrotic skin overlying which was mechanically debrided away with saline and gauze today without complication. Overall post debridement the wounds appear to be doing better but in general his swelling seems to be increased. This is obviously not good news. I think this is what has given rise to the blisters. 12/04/17 on evaluation today patient presents for follow-up concerning his bilateral lower extremity edema in the right lower extremity ulcers. He has been tolerating the dressing changes without complication. With that being said he has had no real issues with the wraps which is also good news. Overall I'm pleased with the progress he's been making. 12/11/17 on evaluation today patient appears to be doing rather well in regard to his right lateral lower extremity ulcer. He's been tolerating the dressing changes without complication. Fortunately there does not appear to be any evidence of infection at this time. Overall I'm pleased with the progress that is being made. Unfortunately he has been in the hospital due to having what sounds to be a stomach virus/flu fortunately that is starting to get better. 12/18/17 on evaluation today patient actually appears to be doing very well in regard to his bilateral lower extremities the swelling is under fairly good control his lymphedema pumps are still not up and running quite yet. With that being said he does have several areas of opening noted as far as wounds are concerned mainly over the left lower extremity. With that being said I do believe once he gets lymphedema pumps  this would least help you mention some the fluid and preventing this from occurring. Hopefully that will be set up soon sleeves are Artie in place at his home he just waiting for the machine. 12/25/17 on evaluation today patient actually appears to be doing excellent in fact all of his ulcers appear to have resolved his legs appear very well. I do think he needs compression stockings we have discussed this and they are actually going to go to [place] today to elastic therapy to get this fitted for him. I think that is definitely a good thing to do. Readmission: 04/09/18 upon evaluation today this patient is seen for readmission due to bilateral lower extremity lymphedema. He has significant swelling of his extremities especially on the left although the right is also swollen he has weeping from both sides. There are no obvious open wounds at this point. Fortunately he has been doing fairly well for quite a bit of time since I last saw him. Nonetheless unfortunately this seems to have reopened and is giving quite a bit of trouble. He states this began about a week ago when he first called [place] to get in to be seen. No fevers, chills, nausea, or vomiting noted at this time. He has not been using his lymphedema pumps due to the fact that they won't fit on his leg at this point likewise is also not been using his compression for essentially the same reason. 04/16/18 upon evaluation today patient actually appears to be doing a little better in regard to the fluid in his bilateral lower extremities. With that being said he's had three falls since I saw him last week. He also states that he's been feeling  very poorly. I was concerned last week and feel like that the concern is still there as far as the congestion in his chest is concerned he seems to be breathing about the same as last week but again he states he's very weak he's not even able to walk further than from the chair to the door. His wife had to  buy a wheelchair just to be able to get them out of the house to get to the appointment today. This has me very concerned. 04/23/18 on evaluation today patient actually appears to be doing much better than last week's evaluation. At that time actually had to transport him to the ER via EMS and he subsequently was admitted for acute pulmonary edema, acute renal failure, and acute congestive heart failure. Fortunately he is doing much better. Apparently they did dialyze him and were able to take off roughly 35 pounds of fluid. Nonetheless he is feeling much better both in regard to his breathing and he's able to get around much better at this time compared to previous. Overall I'm very Adam Merritt, Adam Merritt ([id number]) happy with how things are at this time. There does not appear to be any evidence of infection currently. No fevers, chills, nausea, or vomiting noted at this time. 04/30/2018 patient seen today for follow-up and management of bilateral lower extremity lymphedema. He did express being more sad today than usual due to the recent loss of his dog. He states that he has been compliant with using the lymphedema pumps. However he does admit a minute over the last 2-3 days he has not been using the pumps due to the recent loss of his dog. At this time there is no drainage or open wounds to his lower extremities. The left leg edema is measuring smaller today. Still has a significant amount of edema on bilateral lower extremities With dry flaky skin. He will be referred to the lymphedema clinic for further management. Will continue 3 layer compression wraps and follow-up in 1-2 weeks.Denies any pain, fever, chairs recently. No recent falls or injuries reported during this visit. 05/07/18 on evaluation today patient actually appears to be doing very well in regard to his lower extremities in general all things considered. With that being said he is having some pain in the legs just due to the amount of  swelling. He does have an area where he had a blister on the left lateral lower extremity this is open at this point other than that there's nothing else weeping at this time. 05/14/18 on evaluation today patient actually appears to be doing excellent all things considered in regard to his lower extremities. He still has a couple areas of weeping on each leg which has continued to be the issue for him. He does have an appointment with the lymphedema clinic although this isn't until February 2020. That was the earliest they had. In the meantime he has continued to tolerate the compression wraps without complication. 05/28/18 on evaluation today patient actually appears to be doing more poorly in regard to his left lower extremity where he has a wound open at this point. He also had a fall where he subsequently injured his right great toe which has led to an open wound at the site unfortunately. He has been tolerating the dressing changes without complication in general as far as the wraps are concerned that he has not been putting any dressing on the left 1st toe ulcer site. 06/11/18 on evaluation today patient appears to  be doing much worse in regard to his bilateral lower extremity ulcers. He has been tolerating the dressing changes without complication although his legs have not been wrapped more recently. Overall I am not very pleased with the way his legs appear. I do believe he needs to be back in compression wraps he still has not received his compression wraps from the [facility] as of yet. 06/18/18 on evaluation today patient actually appears to be doing significantly better than last time I saw him. He has been tolerating the compression wraps without complication in the circumferential ulcers especially appear to be doing much better. His toe ulcer on the right in regard to the great toe is better although not as good as the legs in my pinion. No fevers chills noted 07/02/18 on evaluation  today patient appears to be doing much better in regard to his lower extremity ulcers. Unfortunately since I last saw him he's had the distal portion of his right great toe if he dated it sounds as if this actually went downhill very quickly. I had only seen him a few days prior and the toe did not appear to be infected at that point subsequently became infected very rapidly and it was decided by the surgeon that the distal portion of the toe needed to be removed. The patient seems to be doing well in this regard he tells me. With that being said his lower extremities are doing better from the standpoint of the wounds although he is significantly swollen at this point. 07/09/18 on evaluation today patient appears to be doing better in regard to the wounds on his lower extremities. In fact everything is almost completely healed he is just a small area on the left posterior lower extremity that is open at this point. 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 With that being said he needs some kind of compression it does sound like he may not be wearing his compression, that is the wraps, during the entire time between when he's here visit to visit. Apparently his wife took the current one off because it began to "fall apart". 07/16/18 on evaluation today patient appears to be extremely swollen especially in regard to his left lower extremity unfortunately. He also has a new skin tear over the left lower extremity and there's a smaller area on the right lower extremity as well. Unfortunately this seems to be due in part to blistering and fluid buildup in his leg. He did get the reduction wraps that were ordered by the [facility] hospital for him to go to lymphedema clinic. With that being said his wounds on the legs have not healed  to the point to where they would likely accept them as a patient lymphedema clinic currently. We need to try to get this to heal. With that being said he's been taking his wraps off which is not doing him any favors at this point. In fact this is probably quite counterproductive compared to what needs to occur. We will likely need to increase to a four layer compression wrap and continue to also utilize elevation and he has to keep the wraps on not take them off as he's been doing currently hasn't had a wrap on since Saturday. 07/23/18 on evaluation today patient appears to be doing much better in regard to  his bilateral lower extremities. In fact his left lower extremity which was the largest is actually 15 cm smaller today compared to what it was last time he was here in our clinic. This is obviously good news after just one week. Nonetheless the differences he actually kept the wraps on during the entire week this time. That's not typical for him. I do believe he understands a little bit better now the severity of the situation and why it's important for him to keep these wraps on. 07/30/18 on evaluation today patient actually appears to be doing rather well in regard to his lower extremities. His legs are much smaller than they have been in the past and he actually has only one very small rudder superficial region remaining that is not closed on the left lateral/posterior lower extremity even this is almost completely close. I do believe likely next week he will be healed without any complications. I do think we need to continue the wraps however this seems to be beneficial for him. I also think it may be a good time for [place] to go ahead and see about getting the appointment with the lymphedema clinic which is supposed to be made for him in order to keep this moving along and hopefully get them into compression wraps that will in the end help him to remain healed. 08/06/18 on evaluation today patient  actually appears to be doing very well in regard as bilateral lower extremities. In fact his wounds appear to be completely healed at this time. 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 He is in the process of getting appointment with the lymphedema clinic we have made this referral were just waiting to hear back on the schedule time. We need to follow up on that today as well. 08/13/18 on evaluation today patient actually appears to be doing very well in regard to his bilateral lower extremities there are no open wounds at this point. We are gonna go ahead and see about ordering the Velcro compression wraps for him had a discussion with them about [place] doing it versus the [place] they feel like they can definitely afford going ahead and get the wraps themselves and they would prefer to try to avoid having to go to the lymphedema clinic if it all possible which I completely understand. As long as he has good compression I'm okay either way. 3/17//20 on evaluation today patient actually appears to be doing well in regard to his bilateral lower extremity ulcers. He has been tolerating the dressing changes without complication specifically the compression wraps. Overall is had no issues my fingers finding that I see at this point is that he is having trouble with constipation. He tells me he has not been able to go to the bathroom for about six days. He's taken to over-the- counter oral laxatives unfortunately this is not helping. He has not contacted his doctor. Adam Merritt, Adam Merritt ([id number]) 08/27/18 on evaluation today patient appears to be doing fairly well in regard to his lower extremities at this point. There does not appear to be any new altars is swelling is very well controlled. We are still waiting for his Velcro compression wraps to arrive that should be sometime in the next week is Artie sent the check for these. 09/03/18 on evaluation today  patient appears to be doing excellent in regard to his bilateral lower extremities which he shows no signs of wound openings in regard to  this point. He does have his Velcro compression wraps which did arrive in the mail since I saw him last week. Overall he is doing excellent in my pinion. 09/13/18 on evaluation today patient appears to be doing very well currently in regard to the overall appearance of his bilateral lower extremities although he's a little bit more swollen than last time we saw him. At that point he been discharged without any open wounds. Nonetheless he has a small open wound on the posterior left lower extremity with some evidence of cellulitis noted as well. Fortunately I feel like he has made good progress overall with regard to his lower extremities from were things used to be. 09/20/18 on evaluation today patient actually appears to be doing much better. The erythematous lower extremity is improving wound itself which is still open appears to be doing much better as far as but appearance as well as pain is concerned overall very pleased in this regard. There's no signs of active infection at this time. 09/27/18 on evaluation today patient's wounds on the lower extremity actually appear to be doing fairly well at this time which is good news. There is no evidence of active infection currently and again is just as left lower extremity were there any wounds at this point anyway. I believe they may be completely healed but again I'm not 100% sure based on evaluation today. I think one more week of observation would likely be a good idea. 10/04/18 on evaluation today patient actually appears to be doing excellent in regard to the left lower extremity which actually appears to be completely healed as of today. Unfortunately he's been having issues with his right lower extremity have a new wound that has opened. Fortunately there's no evidence of active infection at this time which is  good news. 10/10/18 upon evaluation today patient actually appears to be doing a little bit worse with the new open area on his right posterior lower extremity. He's been tolerating the dressing changes without complication. Right now we been using his compression wraps although I think we may need to switch back to actually performing bilateral compression wraps are in the clinic. No fevers, chills, nausea, or vomiting noted at this time. 10/17/18 on evaluation today patient actually appears to be doing quite well in regard to his bilateral lower extremity ulcers. He has been tolerating the reps without complication although he would prefer not to rewrap his legs as of today. Fortunately there's no signs of active infection which is good news. No fevers, chills, nausea, or vomiting noted at this time. 10/24/18 on evaluation today patient appears to be doing very well in regard to his lower extremities. His right lower extremity is shown signs of healing and his left lower Trinity though not healed appears to be improving which is excellent news. Overall very pleased with how things seem to be progressing at this time. The patient is likewise happy to hear this. His Velcro compression wraps however have not been put on properly will gonna show his wife how to do that properly today. 10/31/18 on evaluation today patient appears to be doing more poorly in regard to his left lower extremity in particular although both lower extremities actually are showing some signs of being worse than my previous evaluation. Unfortunately I'm just not sure that his compression stockings even with the use of the compression/lymphedema pumps seem to be controlling this well. Upon further questioning he tells me that he also is not able to  lie flat in the bed due to his congestive heart failure and difficulty breathing. For that reason he sleeps in his recliner. To make matters worse his recliner also cannot even hold his legs  up so instead of even being somewhat elevated they pretty much hang down to the floor. This is the way he sleeps each night which is definitely counterproductive to everything else that were attempting to do from the standpoint of controlling his fluid. Nonetheless I think that he potentially could benefit from a hospital bed although this would be something that his primary care provider would likely have to order since anything that is order on our side has to be directly related to wound care and again the hospital bed is not necessarily a direct relation to although I think it does contribute to his overall wound status on his lower extremities. 11/07/18 on evaluation today patient appears to be doing better in regard to his bilateral lower extremity. He's been tolerating the dressing changes without complication. We did in the interim since I last saw him switch to just using extras orbiting new alginate and that seems to have done much better for him. I'm very pleased with the overall progress that is made. 11/14/18 on evaluation today patient appears to be redoing rather well in regard to his left lower extremity ulcers which are the only ones remaining at this point. Fortunately there's no signs of active infection at this time which is good news. Overall been very pleased with how things seem to be progressing currently. No fevers, chills, nausea, or vomiting noted at this time. 11/20/17 on evaluation today patient actually appears to be doing quite well in regard to his lower extremities on the left on the right he has several blisters that showed up although there's some question about whether or not he has had his broker compression wraps on like he was supposed to or not. Fortunately there's no signs of active infection at this time which is good news. Unfortunately though he is doing well from the standpoint of the left leg the right leg is not doing as well again this is when we did not wrap  last week. 11/28/18 on evaluation today patient appears to be doing well in regard to his bilateral lower extremities is left appears to be healed is right is not healed but is very close to being so. Overall very pleased with how things seem to be progressing. Patient is likewise happy that things are doing well. 12/09/18 on evaluation today patient actually appears to be completely healed which is excellent news. He actually seems to be doing well in regard to the swelling of the bilateral lower extremities which is also great news. Overall very pleased with how things seem to be progressing. Readmission: 03/18/2019 upon evaluation today patient presents for reevaluation concerning issues that he is having with his left lower extremity where he does have a wound noted upon inspection today. He also has a wound on the right second toe on the tip where it is apparently been rubbing in his shoe I did look at issues and you can actually see where this has been occurring as well. Fortunately there is no signs of infection with regard to the toe necessarily although it is very swollen compared to normal that does have me somewhat concerned about the possibility of further evaluation for infection/osteomyelitis. I would recommend an x-ray to start with. Otherwise he states that it is been 1-2 weeks that he has  had the draining in regard to left lower extremity he does not know how this happened he has been wearing his compression appropriately and I do see that as well today but nonetheless I do think that he needs to continue to be very cautious with regard to elevation as well. SUYASH, AMORY ([id number]) 03/25/2019 on evaluation today patient appears to be doing much better in regard to his lower extremity at this time. That is on the left. He did culture positive for Pseudomonas but the good news is he seems to be doing much better the gentamicin we applied topically seems to have done a great job for  him. There is no signs of active infection at this time systemically and locally he is doing much better. No fevers, chills, nausea, vomiting, or diarrhea. In regard to his right toe this seems to be doing much better there is very little pressure noted at this point I did clean up some of the callus today around the edges of the wound as well as the surface of the wound but to be honest he is progressing quite nicely. 10/30; the area on the left anterior lower tibia area is healed over. His edema control is adequate even though his use of the compression pumps seems very intermittent. He has a juxta lite stocking on the right leg and a think there is 1 for the left leg at home. His wife is putting these on. He has an area on the plantar right second toe which is a hammertoe they are trying to offload this 04/08/2019 on evaluation today patient appears to be doing well in regard to his lower extremities which is good news. We are seeing him today for his toe ulcer which is giving him a little bit more trouble but again still seems to be doing better at this point which is good news. He is going require some sharp debridement in regard to the toe today however. 04/15/2019 on evaluation today patient actually appears to be doing quite well with regard to his toe ulcer. I am very pleased with how things are doing in that regard. With regard to his lower extremity edema in general he tells me he is not been using the lymphedema pumps regularly although he does not use them. I think that he needs to use them more regularly he does have a small blister on the left lower extremity this is not open at this point but obviously it something that can get worse if he does not keep the compression going and the pumps going as well. 04/22/2019 on evaluation today patient appears to be doing well with regard to his toe ulcer. He has been tolerating the dressing changes without complication. This is measuring slightly  smaller this week as compared to last week. Fortunately there is no signs of active infection at this time. 05/06/2019 on evaluation today patient actually appears to be doing excellent. In fact his toe appears to be completely healed which is great news. There is no signs of active infection at this time. Readmission: Patient presents today for follow-up after having been discharged at the beginning of December 2020. He states that in recent weeks things have reopened and has been having more trouble at this point. With that being said fortunately there is no signs of active infection at this time. No fever chills noted. Nonetheless he also does have an issue with one of his toes as well that seems to be somewhat this is  on the right foot second toe. 07/25/2019 upon evaluation today patient's lower extremities appear to be doing excellent bilaterally. I really feel like he is showing signs of great improvement and in fact I think he is close to healing which is great news. There is no signs of active infection at this time. No fevers, chills, nausea, vomiting, or diarrhea. 07/31/2019 upon evaluation today patient appears to be doing excellent and in fact I think may be completely healed in regard to his right lower extremity that were still to monitor this 1 more week before closing it out. The left lower extremity is measuring smaller although not completely closed seems to be doing excellent. 08/07/2019 upon evaluation today patient appears to be doing excellent in regard to his wounds. In fact the right lower extremity is completely healed there is no issues here. The left lower extremity has 1 very small area still remaining fortunately there is no signs of infection and overall I think he is very close to closure of the here as well. 08/14/19 upon evaluation today patient appears to be doing excellent in regard to his lower extremities. In fact he appears to be completely healed as of today.  Fortunately there's no signs of active infection at this time. No fevers, chills, nausea, or vomiting noted at this time. READMISSION 09/26/2019 This is a 74 year old man who is well-known to this clinic having just been discharged on 08/14/2019. He has known lymphedema and chronic venous insufficiency. He has Farrow wrap stockings and also external compression pumps. He does not use the external compression pumps however. He does however apparently fairly faithfully use the stockings. They have not been lotion in his legs. He has developed new wounds on the right anterior as well as the left medial left lateral and left posterior calf. He returns for our review of this Past medical history includes coronary artery disease, hypertension, congestive heart failure, COPD, venous insufficiency with lymphedema, type 2 diabetes. He apparently has compression pumps, Farrow wraps and at 1 point a hospital bed although I believe he sleeps in a recliner 10/03/2019 upon evaluation today patient appears to be doing better with regard to his wounds in general. He has been tolerating the dressing changes without complication. Fortunately there is no signs of active infection. No fevers, chills, nausea, vomiting, or diarrhea. I do believe the compression wraps are helping as they always have in the past. 10/10/2019 upon evaluation today patient appears to be doing excellent at this point. His right lower extremity is measuring much better and in fact is completely healed. His left lower extremity is also measuring better though not healed seems to be doing excellent at this point which is great news. Overall I am very pleased with how things appear currently. 10/27/2019 upon evaluation today patient appears to be doing quite well with regard to his wounds currently. He has been tolerating the dressing changes without complication. Fortunately there is no signs of active infection at this time. In fact he is almost  completely healed on the left and has just a very small area open and on the right he is completely healed. 11/04/2019 upon evaluation today patient appears to be doing quite well with regard to his wounds. In fact he appears to be quite possibly completely healed on the left lower extremity now as well the right lower extremity is still doing well. With that being said unfortunately though this may be healed I think it is a very fragile area and I  cannot even confirm there is not a very small opening still remaining. I really think he may benefit from 1 additional weeks wrapping before discontinuing. 11/10/2019 upon evaluation today patient appears to be doing well in regard to his original wounds in fact everything that we were treating last week is completely healed on the left. Unfortunately he has a new skin tear just above where the wrap slipped down due to a fall he sustained causing this injury. Adam Merritt, Adam Merritt ([id number]) Electronic Signature(s) Signed: 11/10/2019 2:05:40 PM By: Worthy Keeler PA-C Entered By: Worthy Keeler on 11/10/2019 14:05:40 Adam Merritt ([id number]) -------------------------------------------------------------------------------- Physical Exam Details Patient Name: Adam Merritt Date of Service: 11/10/2019 10:45 AM Medical Record Number: [id number] Patient Account Number: [id number] Date of Birth/Sex: 09-28-1945 (74 y.o. M) Treating RN: Army Melia Primary Care Provider: [name] Other Clinician: Referring Provider: [name] Treating Provider/Extender: STONE III, Allard Lightsey [name]: 6 Constitutional Obese and well-hydrated in no acute distress. Respiratory normal breathing without difficulty. Psychiatric this patient is able to make decisions and demonstrates good insight into disease process. Alert and Oriented x 3. pleasant and cooperative. Notes Upon inspection patient has a skin tear over the anterior portion of his left lower extremity and  unfortunately this is going to require additional treatment at this point. I do believe that the compression wrap is still warranted in this case we are working on getting Velcro compression stockings but again that has been somewhat complicated and that his insurance unfortunately will not pay for them so he is going to have to pay for them out of pocket. Electronic Signature(s) Signed: 11/10/2019 2:06:02 PM By: Worthy Keeler PA-C Entered By: Worthy Keeler on 11/10/2019 14:06:02 Adam Merritt ([id number]) -------------------------------------------------------------------------------- Physician Orders Details Patient Name: Adam Merritt Date of Service: 11/10/2019 10:45 AM Medical Record Number: [id number] Patient Account Number: [id number] Date of Birth/Sex: 1945/07/29 (74 y.o. M) Treating RN: Army Melia Primary Care Provider: [name] Other Clinician: Referring Provider: [name] Treating Provider/Extender: Melburn Hake, Casten Floren [name]: 6 Verbal / Phone Orders: No Diagnosis Coding ICD-10 Coding Code Description I89.0 Lymphedema, not elsewhere classified L97.222 Non-pressure chronic ulcer of left calf with fat layer exposed L97.812 Non-pressure chronic ulcer of other part of right lower leg with fat layer exposed Wound Cleansing Wound #35 Left Lower Leg o May shower with protection. - Do not get your wrap wet Primary Wound Dressing Wound #35 Left Lower Leg o Silver Alginate Secondary Dressing Wound #35 Left Lower Leg o ABD pad Dressing Change Frequency o Change dressing every week Follow-up Appointments o Return Appointment in 1 week. Edema Control o 4-Layer Compression System - Left Lower Extremity. o Elevate legs to the level of the heart and pump ankles as often as possible o Compression Pump: Use compression pump on left lower extremity for 60 minutes, twice daily. o Compression Pump: Use compression pump on right lower extremity for  60 minutes, twice daily. Electronic Signature(s) Signed: 11/10/2019 12:56:57 PM By: Army Melia Signed: 11/10/2019 4:20:46 PM By: Worthy Keeler PA-C Entered By: Army Melia on 11/10/2019 11:40:04 Adam Merritt ([id number]) -------------------------------------------------------------------------------- Problem List Details Patient Name: Adam Merritt Date of Service: 11/10/2019 10:45 AM Medical Record Number: [id number] Patient Account Number: [id number] Date of Birth/Sex: 03-Nov-1945 (74 y.o. M) Treating RN: Army Melia Primary Care Provider: [name] Other Clinician: Referring Provider: [name] Treating Provider/Extender: Melburn Hake, Bralin Garry [name]: 6 Active Problems ICD-10 Encounter Code Description Active Date  MDM Diagnosis I89.0 Lymphedema, not elsewhere classified 09/26/2019 No Yes L97.222 Non-pressure chronic ulcer of left calf with fat layer exposed 09/26/2019 No Yes L97.812 Non-pressure chronic ulcer of other part of right lower leg with fat layer 09/26/2019 No Yes exposed Inactive Problems Resolved Problems Electronic Signature(s) Signed: 11/10/2019 10:56:20 AM By: Worthy Keeler PA-C Entered By: Worthy Keeler on 11/10/2019 10:56:19 Adam Merritt ([id number]) -------------------------------------------------------------------------------- Progress Note Details Patient Name: Adam Merritt Date of Service: 11/10/2019 10:45 AM Medical Record Number: [id number] Patient Account Number: [id number] Date of Birth/Sex: 1946-05-29 (74 y.o. M) Treating RN: Army Melia Primary Care Provider: [name] Other Clinician: Referring Provider: [name] Treating Provider/Extender: Melburn Hake, Ennis Delpozo [name]: 6 Subjective Chief Complaint Information obtained from Patient Bilateral LE Ulcers and right second toe ulcer 09/26/2019; patient is here for review bilateral lower leg wounds. He is well familiar to this clinic History of Present Illness  (HPI) 10/18/17-He is here for initial evaluation of bilateral lower extremity ulcerations in the presence of venous insufficiency and lymphedema. He has been seen by vascular medicine in the past, Dr. Lucky Cowboy, last seen in 2016. He does have a history of abnormal ABIs, which is to be expected given his lymphedema and venous insufficiency. According to Epic, it appears that all attempts for arterial evaluation and/or angiography were not follow through with by patient. He does have a history of being seen in lymphedema clinic in 2018, stopped going approximately 6 months ago stating "it didn't do any good". He does not have lymphedema pumps, he does not have custom fit compression wrap/stockings. He is diabetic and his recent A1c last month was 7.6. He admits to chronic bilateral lower extremity pain, no change in pain since blister and ulceration development. He is currently being treated with Levaquin for bronchitis. He has home health and we will continue. 10/25/17-He is here in follow-up evaluation for bilateral lower extremity ulcerationssubtle he remains on Levaquin for bronchitis. Right lower extremity with no evidence of drainage or ulceration, persistent left lower extremity ulceration. He states that home health has not been out since his appointment. He went to [facility] on Tuesday, studies revealed: RIGHT ABI 0.9, TBI 0.6 LEFT ABI 1.1, TBI 0.6 with triphasic flow bilaterally. We will continue his same treatment plan. He has been educated on compression therapy and need for elevation. He will benefit from lymphedema pumps 11/01/17-He is here in follow-up evaluation for left lower extremity ulcer. The right lower extremity remains healed. He has home health services, but they have not been out to see the patient for 2-3 weeks. He states it home health physical therapy changed his dressing yesterday after therapy; he placed Ace wrap compression. We are still waiting for lymphedema  pumps, reordered d/t need for company change. 11/08/17-He is here in follow-up evaluation for left lower extremity ulcer. It is improved. Edema is significantly improved with compression therapy. We will continue with same treatment plan and he will follow-up next week. No word regarding lymphedema pumps 11/15/17-He is here in follow-up evaluation for left lower extremity ulcer. He is healed and will be discharged from wound care services. I have reached out to [facility] regarding his lymphedema pumps. They have been unable to reach the patient; the contact number they had with the patient's wife's cell phone and she has not answered any unrecognized calls. Contact should be made today, trial planned for next week; [facility] will continue to follow 11/27/17 on evaluation today patient has  multiple blistered areas over the right lower extremity his left lower extremity appears to be doing okay. These blistered areas show signs of no infection which is great news. With that being said he did have some necrotic skin overlying which was mechanically debrided away with saline and gauze today without complication. Overall post debridement the wounds appear to be doing better but in general his swelling seems to be increased. This is obviously not good news. I think this is what has given rise to the blisters. 12/04/17 on evaluation today patient presents for follow-up concerning his bilateral lower extremity edema in the right lower extremity ulcers. He has been tolerating the dressing changes without complication. With that being said he has had no real issues with the wraps which is also good news. Overall I'm pleased with the progress he's been making. 12/11/17 on evaluation today patient appears to be doing rather well in regard to his right lateral lower extremity ulcer. He's been tolerating the dressing changes without complication. Fortunately there does not appear to be any evidence of  infection at this time. Overall I'm pleased with the progress that is being made. Unfortunately he has been in the hospital due to having what sounds to be a stomach virus/flu fortunately that is starting to get better. 12/18/17 on evaluation today patient actually appears to be doing very well in regard to his bilateral lower extremities the swelling is under fairly good control his lymphedema pumps are still not up and running quite yet. With that being said he does have several areas of opening noted as far as wounds are concerned mainly over the left lower extremity. With that being said I do believe once he gets lymphedema pumps this would least help you mention some the fluid and preventing this from occurring. Hopefully that will be set up soon sleeves are Artie in place at his home he just waiting for the machine. 12/25/17 on evaluation today patient actually appears to be doing excellent in fact all of his ulcers appear to have resolved his legs appear very well. I do think he needs compression stockings we have discussed this and they are actually going to go to [place] today to elastic therapy to get this fitted for him. I think that is definitely a good thing to do. Readmission: 04/09/18 upon evaluation today this patient is seen for readmission due to bilateral lower extremity lymphedema. He has significant swelling of his extremities especially on the left although the right is also swollen he has weeping from both sides. There are no obvious open wounds at this point. Fortunately he has been doing fairly well for quite a bit of time since I last saw him. Nonetheless unfortunately this seems to have reopened and is giving quite a bit of trouble. He states this began about a week ago when he first called [place] to get in to be seen. No fevers, chills, nausea, or vomiting noted at this time. He has not been using his lymphedema pumps due to the fact that they won't fit on his leg at this point  likewise is also not been using his compression for essentially the same reason. 04/16/18 upon evaluation today patient actually appears to be doing a little better in regard to the fluid in his bilateral lower extremities. With that being said he's had three falls since I saw him last week. He also states that he's been feeling very poorly. I was concerned last week and feel like that the concern is  still there as far as the congestion in his chest is concerned he seems to be breathing about the same as last week but again he states he's very weak he's not even able to walk further than from the chair to the door. His wife had to buy a wheelchair just to be able to get them out of the house to get to the appointment today. This has me very concerned. Adam Merritt, Adam Merritt ([id number]) 04/23/18 on evaluation today patient actually appears to be doing much better than last week's evaluation. At that time actually had to transport him to the ER via EMS and he subsequently was admitted for acute pulmonary edema, acute renal failure, and acute congestive heart failure. Fortunately he is doing much better. Apparently they did dialyze him and were able to take off roughly 35 pounds of fluid. Nonetheless he is feeling much better both in regard to his breathing and he's able to get around much better at this time compared to previous. Overall I'm very happy with how things are at this time. There does not appear to be any evidence of infection currently. No fevers, chills, nausea, or vomiting noted at this time. 04/30/2018 patient seen today for follow-up and management of bilateral lower extremity lymphedema. He did express being more sad today than usual due to the recent loss of his dog. He states that he has been compliant with using the lymphedema pumps. However he does admit a minute over the last 2-3 days he has not been using the pumps due to the recent loss of his dog. At this time there is no drainage or  open wounds to his lower extremities. The left leg edema is measuring smaller today. Still has a significant amount of edema on bilateral lower extremities With dry flaky skin. He will be referred to the lymphedema clinic for further management. Will continue 3 layer compression wraps and follow-up in 1-2 weeks.Denies any pain, fever, chairs recently. No recent falls or injuries reported during this visit. 05/07/18 on evaluation today patient actually appears to be doing very well in regard to his lower extremities in general all things considered. With that being said he is having some pain in the legs just due to the amount of swelling. He does have an area where he had a blister on the left lateral lower extremity this is open at this point other than that there's nothing else weeping at this time. 05/14/18 on evaluation today patient actually appears to be doing excellent all things considered in regard to his lower extremities. He still has a couple areas of weeping on each leg which has continued to be the issue for him. He does have an appointment with the lymphedema clinic although this isn't until February 2020. That was the earliest they had. In the meantime he has continued to tolerate the compression wraps without complication. 05/28/18 on evaluation today patient actually appears to be doing more poorly in regard to his left lower extremity where he has a wound open at this point. He also had a fall where he subsequently injured his right great toe which has led to an open wound at the site unfortunately. He has been tolerating the dressing changes without complication in general as far as the wraps are concerned that he has not been putting any dressing on the left 1st toe ulcer site. 06/11/18 on evaluation today patient appears to be doing much worse in regard to his bilateral lower extremity ulcers. He has been  tolerating the dressing changes without complication although his legs have  not been wrapped more recently. Overall I am not very pleased with the way his legs appear. I do believe he needs to be back in compression wraps he still has not received his compression wraps from the [facility] hospital as of yet. 06/18/18 on evaluation today patient actually appears to be doing significantly better than last time I saw him. He has been tolerating the compression wraps without complication in the circumferential ulcers especially appear to be doing much better. His toe ulcer on the right in regard to the great toe is better although not as good as the legs in my pinion. No fevers chills noted 07/02/18 on evaluation today patient appears to be doing much better in regard to his lower extremity ulcers. Unfortunately since I last saw him he's had the distal portion of his right great toe if he dated it sounds as if this actually went downhill very quickly. I had only seen him a few days prior and the toe did not appear to be infected at that point subsequently became infected very rapidly and it was decided by the surgeon that the distal portion of the toe needed to be removed. The patient seems to be doing well in this regard he tells me. With that being said his lower extremities are doing better from the standpoint of the wounds although he is significantly swollen at this point. 07/09/18 on evaluation today patient appears to be doing better in regard to the wounds on his lower extremities. In fact everything is almost completely healed he is just a small area on the left posterior lower extremity that is open at this point. 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 With that being said he needs some kind of compression it does sound like he may not be wearing his compression, that is the wraps, during the  entire time between when he's here visit to visit. Apparently his wife took the current one off because it began to "fall apart". 07/16/18 on evaluation today patient appears to be extremely swollen especially in regard to his left lower extremity unfortunately. He also has a new skin tear over the left lower extremity and there's a smaller area on the right lower extremity as well. Unfortunately this seems to be due in part to blistering and fluid buildup in his leg. He did get the reduction wraps that were ordered by the [facility] for him to go to lymphedema clinic. With that being said his wounds on the legs have not healed to the point to where they would likely accept them as a patient lymphedema clinic currently. We need to try to get this to heal. With that being said he's been taking his wraps off which is not doing him any favors at this point. In fact this is probably quite counterproductive compared to what needs to occur. We will likely need to increase to a four layer compression wrap and continue to also utilize elevation and he has to keep the wraps on not take them off as he's been doing currently hasn't had a wrap on since Saturday. 07/23/18 on evaluation today patient appears to be doing much better in regard to his bilateral lower extremities. In fact his left lower extremity which was the largest is  actually 15 cm smaller today compared to what it was last time he was here in our clinic. This is obviously good news after just one week. Nonetheless the differences he actually kept the wraps on during the entire week this time. That's not typical for him. I do believe he understands a little bit better now the severity of the situation and why it's important for him to keep these wraps on. 07/30/18 on evaluation today patient actually appears to be doing rather well in regard to his lower extremities. His legs are much smaller than they have been in the past and he actually has only  one very small rudder superficial region remaining that is not closed on the left lateral/posterior lower extremity even this is almost completely close. I do believe likely next week he will be healed without any complications. I do think we need to continue the wraps however this seems to be beneficial for him. I also think it may be a good time for [place] to go ahead and see about getting the appointment with the lymphedema clinic which is supposed to be made for him in order to keep this moving along and hopefully get them into compression wraps that will in the end help him to remain healed. 08/06/18 on evaluation today patient actually appears to be doing very well in regard as bilateral lower extremities. In fact his wounds appear to be completely healed at this time. 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 He is in the process of getting appointment with the lymphedema clinic we have made this referral were just waiting to hear back on the schedule time. We need to follow up on that today as well. 08/13/18 on evaluation today patient actually appears to be doing very well in regard to his bilateral lower extremities there are no open wounds at this point. We are gonna go ahead and see about ordering the Velcro compression wraps for him had a discussion with them about [place] doing it versus the [place] they feel like they can definitely afford going ahead and get the wraps themselves and they would prefer to try to avoid having to go to the lymphedema clinic if it all possible which I completely understand. As long as he has good compression I'm okay either way. Adam Merritt, Adam Merritt ([id number]) 3/17//20 on evaluation today patient actually appears to be doing well in regard to his bilateral lower extremity ulcers. He has been tolerating the dressing changes without complication specifically the compression wraps. Overall is had no issues my fingers  finding that I see at this point is that he is having trouble with constipation. He tells me he has not been able to go to the bathroom for about six days. He's taken to over-the- counter oral laxatives unfortunately this is not helping. He has not contacted his doctor. 08/27/18 on evaluation today patient appears to be doing fairly well in regard to his lower extremities at this point. There does not appear to be any new altars is swelling is very well controlled. We are still waiting for his Velcro compression wraps to arrive that should be sometime in the next week is Artie sent the check for these. 09/03/18 on evaluation today patient appears to be doing excellent in regard to his bilateral lower extremities which he shows no signs of wound openings in regard to this point. He does have his Velcro compression wraps which did arrive in the mail  since I saw him last week. Overall he is doing excellent in my pinion. 09/13/18 on evaluation today patient appears to be doing very well currently in regard to the overall appearance of his bilateral lower extremities although he's a little bit more swollen than last time we saw him. At that point he been discharged without any open wounds. Nonetheless he has a small open wound on the posterior left lower extremity with some evidence of cellulitis noted as well. Fortunately I feel like he has made good progress overall with regard to his lower extremities from were things used to be. 09/20/18 on evaluation today patient actually appears to be doing much better. The erythematous lower extremity is improving wound itself which is still open appears to be doing much better as far as but appearance as well as pain is concerned overall very pleased in this regard. There's no signs of active infection at this time. 09/27/18 on evaluation today patient's wounds on the lower extremity actually appear to be doing fairly well at this time which is good news. There is no  evidence of active infection currently and again is just as left lower extremity were there any wounds at this point anyway. I believe they may be completely healed but again I'm not 100% sure based on evaluation today. I think one more week of observation would likely be a good idea. 10/04/18 on evaluation today patient actually appears to be doing excellent in regard to the left lower extremity which actually appears to be completely healed as of today. Unfortunately he's been having issues with his right lower extremity have a new wound that has opened. Fortunately there's no evidence of active infection at this time which is good news. 10/10/18 upon evaluation today patient actually appears to be doing a little bit worse with the new open area on his right posterior lower extremity. He's been tolerating the dressing changes without complication. Right now we been using his compression wraps although I think we may need to switch back to actually performing bilateral compression wraps are in the clinic. No fevers, chills, nausea, or vomiting noted at this time. 10/17/18 on evaluation today patient actually appears to be doing quite well in regard to his bilateral lower extremity ulcers. He has been tolerating the reps without complication although he would prefer not to rewrap his legs as of today. Fortunately there's no signs of active infection which is good news. No fevers, chills, nausea, or vomiting noted at this time. 10/24/18 on evaluation today patient appears to be doing very well in regard to his lower extremities. His right lower extremity is shown signs of healing and his left lower Trinity though not healed appears to be improving which is excellent news. Overall very pleased with how things seem to be progressing at this time. The patient is likewise happy to hear this. His Velcro compression wraps however have not been put on properly will gonna show his wife how to do that properly  today. 10/31/18 on evaluation today patient appears to be doing more poorly in regard to his left lower extremity in particular although both lower extremities actually are showing some signs of being worse than my previous evaluation. Unfortunately I'm just not sure that his compression stockings even with the use of the compression/lymphedema pumps seem to be controlling this well. Upon further questioning he tells me that he also is not able to lie flat in the bed due to his congestive heart failure and difficulty breathing.  For that reason he sleeps in his recliner. To make matters worse his recliner also cannot even hold his legs up so instead of even being somewhat elevated they pretty much hang down to the floor. This is the way he sleeps each night which is definitely counterproductive to everything else that were attempting to do from the standpoint of controlling his fluid. Nonetheless I think that he potentially could benefit from a hospital bed although this would be something that his primary care provider would likely have to order since anything that is order on our side has to be directly related to wound care and again the hospital bed is not necessarily a direct relation to although I think it does contribute to his overall wound status on his lower extremities. 11/07/18 on evaluation today patient appears to be doing better in regard to his bilateral lower extremity. He's been tolerating the dressing changes without complication. We did in the interim since I last saw him switch to just using extras orbiting new alginate and that seems to have done much better for him. I'm very pleased with the overall progress that is made. 11/14/18 on evaluation today patient appears to be redoing rather well in regard to his left lower extremity ulcers which are the only ones remaining at this point. Fortunately there's no signs of active infection at this time which is good news. Overall been very  pleased with how things seem to be progressing currently. No fevers, chills, nausea, or vomiting noted at this time. 11/20/17 on evaluation today patient actually appears to be doing quite well in regard to his lower extremities on the left on the right he has several blisters that showed up although there's some question about whether or not he has had his broker compression wraps on like he was supposed to or not. Fortunately there's no signs of active infection at this time which is good news. Unfortunately though he is doing well from the standpoint of the left leg the right leg is not doing as well again this is when we did not wrap last week. 11/28/18 on evaluation today patient appears to be doing well in regard to his bilateral lower extremities is left appears to be healed is right is not healed but is very close to being so. Overall very pleased with how things seem to be progressing. Patient is likewise happy that things are doing well. 12/09/18 on evaluation today patient actually appears to be completely healed which is excellent news. He actually seems to be doing well in regard to the swelling of the bilateral lower extremities which is also great news. Overall very pleased with how things seem to be progressing. Readmission: 03/18/2019 upon evaluation today patient presents for reevaluation concerning issues that he is having with his left lower extremity where he does have a wound noted upon inspection today. He also has a wound on the right second toe on the tip where it is apparently been rubbing in his shoe I did look at issues and you can actually see where this has been occurring as well. Fortunately there is no signs of infection with regard to Adam Merritt, Adam Merritt. ([id number]) the toe necessarily although it is very swollen compared to normal that does have me somewhat concerned about the possibility of further evaluation for infection/osteomyelitis. I would recommend an x-ray to start  with. Otherwise he states that it is been 1-2 weeks that he has had the draining in regard to left lower extremity he  does not know how this happened he has been wearing his compression appropriately and I do see that as well today but nonetheless I do think that he needs to continue to be very cautious with regard to elevation as well. 03/25/2019 on evaluation today patient appears to be doing much better in regard to his lower extremity at this time. That is on the left. He did culture positive for Pseudomonas but the good news is he seems to be doing much better the gentamicin we applied topically seems to have done a great job for him. There is no signs of active infection at this time systemically and locally he is doing much better. No fevers, chills, nausea, vomiting, or diarrhea. In regard to his right toe this seems to be doing much better there is very little pressure noted at this point I did clean up some of the callus today around the edges of the wound as well as the surface of the wound but to be honest he is progressing quite nicely. 10/30; the area on the left anterior lower tibia area is healed over. His edema control is adequate even though his use of the compression pumps seems very intermittent. He has a juxta lite stocking on the right leg and a think there is 1 for the left leg at home. His wife is putting these on. He has an area on the plantar right second toe which is a hammertoe they are trying to offload this 04/08/2019 on evaluation today patient appears to be doing well in regard to his lower extremities which is good news. We are seeing him today for his toe ulcer which is giving him a little bit more trouble but again still seems to be doing better at this point which is good news. He is going require some sharp debridement in regard to the toe today however. 04/15/2019 on evaluation today patient actually appears to be doing quite well with regard to his toe ulcer. I am  very pleased with how things are doing in that regard. With regard to his lower extremity edema in general he tells me he is not been using the lymphedema pumps regularly although he does not use them. I think that he needs to use them more regularly he does have a small blister on the left lower extremity this is not open at this point but obviously it something that can get worse if he does not keep the compression going and the pumps going as well. 04/22/2019 on evaluation today patient appears to be doing well with regard to his toe ulcer. He has been tolerating the dressing changes without complication. This is measuring slightly smaller this week as compared to last week. Fortunately there is no signs of active infection at this time. 05/06/2019 on evaluation today patient actually appears to be doing excellent. In fact his toe appears to be completely healed which is great news. There is no signs of active infection at this time. Readmission: Patient presents today for follow-up after having been discharged at the beginning of December 2020. He states that in recent weeks things have reopened and has been having more trouble at this point. With that being said fortunately there is no signs of active infection at this time. No fever chills noted. Nonetheless he also does have an issue with one of his toes as well that seems to be somewhat this is on the right foot second toe. 07/25/2019 upon evaluation today patient's lower extremities appear to  be doing excellent bilaterally. I really feel like he is showing signs of great improvement and in fact I think he is close to healing which is great news. There is no signs of active infection at this time. No fevers, chills, nausea, vomiting, or diarrhea. 07/31/2019 upon evaluation today patient appears to be doing excellent and in fact I think may be completely healed in regard to his right lower extremity that were still to monitor this 1 more week  before closing it out. The left lower extremity is measuring smaller although not completely closed seems to be doing excellent. 08/07/2019 upon evaluation today patient appears to be doing excellent in regard to his wounds. In fact the right lower extremity is completely healed there is no issues here. The left lower extremity has 1 very small area still remaining fortunately there is no signs of infection and overall I think he is very close to closure of the here as well. 08/14/19 upon evaluation today patient appears to be doing excellent in regard to his lower extremities. In fact he appears to be completely healed as of today. Fortunately there's no signs of active infection at this time. No fevers, chills, nausea, or vomiting noted at this time. READMISSION 09/26/2019 This is a 74 year old man who is well-known to this clinic having just been discharged on 08/14/2019. He has known lymphedema and chronic venous insufficiency. He has Farrow wrap stockings and also external compression pumps. He does not use the external compression pumps however. He does however apparently fairly faithfully use the stockings. They have not been lotion in his legs. He has developed new wounds on the right anterior as well as the left medial left lateral and left posterior calf. He returns for our review of this Past medical history includes coronary artery disease, hypertension, congestive heart failure, COPD, venous insufficiency with lymphedema, type 2 diabetes. He apparently has compression pumps, Farrow wraps and at 1 point a hospital bed although I believe he sleeps in a recliner 10/03/2019 upon evaluation today patient appears to be doing better with regard to his wounds in general. He has been tolerating the dressing changes without complication. Fortunately there is no signs of active infection. No fevers, chills, nausea, vomiting, or diarrhea. I do believe the compression wraps are helping as they always have  in the past. 10/10/2019 upon evaluation today patient appears to be doing excellent at this point. His right lower extremity is measuring much better and in fact is completely healed. His left lower extremity is also measuring better though not healed seems to be doing excellent at this point which is great news. Overall I am very pleased with how things appear currently. 10/27/2019 upon evaluation today patient appears to be doing quite well with regard to his wounds currently. He has been tolerating the dressing changes without complication. Fortunately there is no signs of active infection at this time. In fact he is almost completely healed on the left and has just a very small area open and on the right he is completely healed. 11/04/2019 upon evaluation today patient appears to be doing quite well with regard to his wounds. In fact he appears to be quite possibly completely healed on the left lower extremity now as well the right lower extremity is still doing well. With that being said unfortunately though this may be healed I think it is a very fragile area and I cannot even confirm there is not a very small opening still remaining. I really think  he may benefit from 1 additional weeks wrapping before discontinuing. Adam Merritt, Adam Merritt ([id number]) 11/10/2019 upon evaluation today patient appears to be doing well in regard to his original wounds in fact everything that we were treating last week is completely healed on the left. Unfortunately he has a new skin tear just above where the wrap slipped down due to a fall he sustained causing this injury. Objective Constitutional Obese and well-hydrated in no acute distress. Vitals Time Taken: 10:50 AM, Temperature: 98.3 F, Pulse: 72 bpm, Respiratory Rate: 20 breaths/min, Blood Pressure: 191/73 mmHg. Respiratory normal breathing without difficulty. Psychiatric this patient is able to make decisions and demonstrates good insight into disease process.  Alert and Oriented x 3. pleasant and cooperative. General Notes: Upon inspection patient has a skin tear over the anterior portion of his left lower extremity and unfortunately this is going to require additional treatment at this point. I do believe that the compression wrap is still warranted in this case we are working on getting Velcro compression stockings but again that has been somewhat complicated and that his insurance unfortunately will not pay for them so he is going to have to pay for them out of pocket. Integumentary (Hair, Skin) Wound #35 status is Open. Original cause of wound was Trauma. The wound is located on the Left Lower Leg. The wound measures 3.1cm length x 2.2cm width x 0.1cm depth; 5.356cm^2 area and 0.536cm^3 volume. There is Fat Layer (Subcutaneous Tissue) Exposed exposed. There is no tunneling or undermining noted. There is a medium amount of serous drainage noted. The wound margin is flat and intact. There is large (67- 100%) pink granulation within the wound bed. There is no necrotic tissue within the wound bed. 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 There was a Four Layer Compression Therapy Procedure by Army Melia, RN. Post procedure Diagnosis Wound #35: Same as Pre-Procedure Plan Wound Cleansing: Wound #35 Left Lower Leg: May shower with protection. - Do not get your wrap wet Primary Wound Dressing: Wound #35 Left Lower Leg: Adam Merritt, Adam Merritt ([id number]) Silver Alginate Secondary Dressing: Wound #35 Left Lower Leg: ABD pad Dressing Change Frequency: Change dressing every week Follow-up Appointments: Return Appointment in 1 week. Edema Control: 4-Layer Compression System  - Left Lower Extremity. Elevate legs to the level of the heart and pump ankles as often as possible Compression Pump: Use compression pump on left lower extremity for 60 minutes, twice daily. Compression Pump: Use compression pump on right lower extremity for 60 minutes, twice daily. 1. I would recommend currently that we go ahead and continue with the silver alginate dressing to the open wound and we will also continue with the compression wrap we are can use Unna to anchor at the top and again we will get a use a 4-layer compression wrap to left lower extremity. 2. I do recommend the patient needs to elevate his legs much as possible. 3. Also recommend the he should be walking only with a walker and not without at any point I have discussed this with him several times due to his consistent and repeated falls. With that being said he comes in today again without his walker. We will see patient back for reevaluation in 1 week here  in the clinic. If anything worsens or changes patient will contact our office for additional recommendations. Electronic Signature(s) Signed: 11/10/2019 2:06:43 PM By: Worthy Keeler PA-C Entered By: Worthy Keeler on 11/10/2019 14:06:43 Adam Merritt ([id number]) -------------------------------------------------------------------------------- SuperBill Details Patient Name: Adam Merritt Date of Service: 11/10/2019 Medical Record Number: [id number] Patient Account Number: [id number] Date of Birth/Sex: May 02, 1946 (74 y.o. M) Treating RN: Army Melia Primary Care Provider: [name] Other Clinician: Referring Provider: [name] Treating Provider/Extender: Melburn Hake, [name] [name] [name]: 6 Diagnosis Coding ICD-10 Codes Code Description I89.0 Lymphedema, not elsewhere classified L97.222 Non-pressure chronic ulcer of left calf with fat layer exposed L97.812 Non-pressure chronic ulcer of other part of right lower leg with fat layer exposed Facility  Procedures CPT4 Code: [id number] Description: (Facility Use Only) 29581LT - [facility] LWR LT LEG Modifier: Quantity: 1 Physician Procedures CPT4 Code: 7493552 Description: 17471 - WC PHYS LEVEL 3 - EST PT Modifier: Quantity: 1 CPT4 Code: Description: ICD-10 Diagnosis Description I89.0 Lymphedema, not elsewhere classified 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

## 2019-11-17 ENCOUNTER — Other Ambulatory Visit: Payer: Self-pay

## 2019-11-17 ENCOUNTER — Encounter: Payer: Medicare PPO | Admitting: Physician Assistant

## 2019-11-17 DIAGNOSIS — L97222 Non-pressure chronic ulcer of left calf with fat layer exposed: Secondary | ICD-10-CM | POA: Diagnosis not present

## 2019-11-17 DIAGNOSIS — E11622 Type 2 diabetes mellitus with other skin ulcer: Secondary | ICD-10-CM | POA: Diagnosis not present

## 2019-11-17 DIAGNOSIS — E1151 Type 2 diabetes mellitus with diabetic peripheral angiopathy without gangrene: Secondary | ICD-10-CM | POA: Diagnosis not present

## 2019-11-17 DIAGNOSIS — E669 Obesity, unspecified: Secondary | ICD-10-CM | POA: Diagnosis not present

## 2019-11-17 DIAGNOSIS — I110 Hypertensive heart disease with heart failure: Secondary | ICD-10-CM | POA: Diagnosis not present

## 2019-11-17 DIAGNOSIS — I509 Heart failure, unspecified: Secondary | ICD-10-CM | POA: Diagnosis not present

## 2019-11-17 DIAGNOSIS — L97812 Non-pressure chronic ulcer of other part of right lower leg with fat layer exposed: Secondary | ICD-10-CM | POA: Diagnosis not present

## 2019-11-17 DIAGNOSIS — I890 Lymphedema, not elsewhere classified: Secondary | ICD-10-CM | POA: Diagnosis not present

## 2019-11-17 DIAGNOSIS — G4730 Sleep apnea, unspecified: Secondary | ICD-10-CM | POA: Diagnosis not present

## 2019-11-17 DIAGNOSIS — I2510 Atherosclerotic heart disease of native coronary artery without angina pectoris: Secondary | ICD-10-CM | POA: Diagnosis not present

## 2019-11-17 NOTE — Progress Notes (Signed)
Adam Merritt, Adam Merritt ([id number]) Visit Report for 11/17/2019 Chief Complaint Document Details Patient Name: Adam, Merritt Date of Service: 11/17/2019 10:45 AM Medical Record Number: [id number] Patient Account Number: [id number] Date of Birth/Sex: 1946/05/21 (74 y.o. M) Treating RN: Cornell Barman Primary Care Provider: [name] Other Clinician: Referring Provider: [name] Treating Provider/Extender: Melburn Hake, Fariha Goto [name]: 7 Information Obtained from: Patient Chief Complaint Bilateral LE Ulcers and right second toe ulcer 09/26/2019; patient is here for review bilateral lower leg wounds. He is well familiar to this clinic Electronic Signature(s) Signed: 11/17/2019 11:05:19 AM By: Worthy Keeler PA-C Entered By: Worthy Keeler on 11/17/2019 11:05:18 Adam Merritt ([id number]) -------------------------------------------------------------------------------- HPI Details Patient Name: Adam Merritt Date of Service: 11/17/2019 10:45 AM Medical Record Number: [id number] Patient Account Number: [id number] Date of Birth/Sex: August 24, 1945 (74 y.o. M) Treating RN: Cornell Barman Primary Care Provider: [name] Other Clinician: Referring Provider: [name] Treating Provider/Extender: Melburn Hake, July Linam [name]: 7 History of Present Illness HPI Description: 10/18/17-He is here for initial evaluation of bilateral lower extremity ulcerations in the presence of venous insufficiency and lymphedema. He has been seen by vascular medicine in the past, Dr. Lucky Cowboy, last seen in 2016. He does have a history of abnormal ABIs, which is to be expected given his lymphedema and venous insufficiency. According to Epic, it appears that all attempts for arterial evaluation and/or angiography were not follow through with by patient. He does have a history of being seen in lymphedema clinic in 2018, stopped going approximately 6 months ago stating "it didn't do any good". He does not have lymphedema  pumps, he does not have custom fit compression wrap/stockings. He is diabetic and his recent A1c last month was 7.6. He admits to chronic bilateral lower extremity pain, no change in pain since blister and ulceration development. He is currently being treated with Levaquin for bronchitis. He has home health and we will continue. 10/25/17-He is here in follow-up evaluation for bilateral lower extremity ulcerationssubtle he remains on Levaquin for bronchitis. Right lower extremity with no evidence of drainage or ulceration, persistent left lower extremity ulceration. He states that home health has not been out since his appointment. He went to [facility] on Tuesday, studies revealed: RIGHT ABI 0.9, TBI 0.6 LEFT ABI 1.1, TBI 0.6 with triphasic flow bilaterally. We will continue his same treatment plan. He has been educated on compression therapy and need for elevation. He will benefit from lymphedema pumps 11/01/17-He is here in follow-up evaluation for left lower extremity ulcer. The right lower extremity remains healed. He has home health services, but they have not been out to see the patient for 2-3 weeks. He states it home health physical therapy changed his dressing yesterday after therapy; he placed Ace wrap compression. We are still waiting for lymphedema pumps, reordered d/t need for company change. 11/08/17-He is here in follow-up evaluation for left lower extremity ulcer. It is improved. Edema is significantly improved with compression therapy. We will continue with same treatment plan and he will follow-up next week. No word regarding lymphedema pumps 11/15/17-He is here in follow-up evaluation for left lower extremity ulcer. He is healed and will be discharged from wound care services. I have reached out to [facility] regarding his lymphedema pumps. They have been unable to reach the patient; the contact number they had with the patient's wife's cell phone and she has not  answered any unrecognized calls. Contact should be made today, trial planned  for next week; [facility] will continue to follow 11/27/17 on evaluation today patient has multiple blistered areas over the right lower extremity his left lower extremity appears to be doing okay. These blistered areas show signs of no infection which is great news. With that being said he did have some necrotic skin overlying which was mechanically debrided away with saline and gauze today without complication. Overall post debridement the wounds appear to be doing better but in general his swelling seems to be increased. This is obviously not good news. I think this is what has given rise to the blisters. 12/04/17 on evaluation today patient presents for follow-up concerning his bilateral lower extremity edema in the right lower extremity ulcers. He has been tolerating the dressing changes without complication. With that being said he has had no real issues with the wraps which is also good news. Overall I'm pleased with the progress he's been making. 12/11/17 on evaluation today patient appears to be doing rather well in regard to his right lateral lower extremity ulcer. He's been tolerating the dressing changes without complication. Fortunately there does not appear to be any evidence of infection at this time. Overall I'm pleased with the progress that is being made. Unfortunately he has been in the hospital due to having what sounds to be a stomach virus/flu fortunately that is starting to get better. 12/18/17 on evaluation today patient actually appears to be doing very well in regard to his bilateral lower extremities the swelling is under fairly good control his lymphedema pumps are still not up and running quite yet. With that being said he does have several areas of opening noted as far as wounds are concerned mainly over the left lower extremity. With that being said I do believe once he gets lymphedema pumps  this would least help you mention some the fluid and preventing this from occurring. Hopefully that will be set up soon sleeves are Artie in place at his home he just waiting for the machine. 12/25/17 on evaluation today patient actually appears to be doing excellent in fact all of his ulcers appear to have resolved his legs appear very well. I do think he needs compression stockings we have discussed this and they are actually going to go to [place] today to elastic therapy to get this fitted for him. I think that is definitely a good thing to do. Readmission: 04/09/18 upon evaluation today this patient is seen for readmission due to bilateral lower extremity lymphedema. He has significant swelling of his extremities especially on the left although the right is also swollen he has weeping from both sides. There are no obvious open wounds at this point. Fortunately he has been doing fairly well for quite a bit of time since I last saw him. Nonetheless unfortunately this seems to have reopened and is giving quite a bit of trouble. He states this began about a week ago when he first called [place] to get in to be seen. No fevers, chills, nausea, or vomiting noted at this time. He has not been using his lymphedema pumps due to the fact that they won't fit on his leg at this point likewise is also not been using his compression for essentially the same reason. 04/16/18 upon evaluation today patient actually appears to be doing a little better in regard to the fluid in his bilateral lower extremities. With that being said he's had three falls since I saw him last week. He also states that he's been feeling  very poorly. I was concerned last week and feel like that the concern is still there as far as the congestion in his chest is concerned he seems to be breathing about the same as last week but again he states he's very weak he's not even able to walk further than from the chair to the door. His wife had to  buy a wheelchair just to be able to get them out of the house to get to the appointment today. This has me very concerned. 04/23/18 on evaluation today patient actually appears to be doing much better than last week's evaluation. At that time actually had to transport him to the ER via EMS and he subsequently was admitted for acute pulmonary edema, acute renal failure, and acute congestive heart failure. Fortunately he is doing much better. Apparently they did dialyze him and were able to take off roughly 35 pounds of fluid. Nonetheless he is feeling much better both in regard to his breathing and he's able to get around much better at this time compared to previous. Overall I'm very Adam Merritt, Adam Merritt ([id number]) happy with how things are at this time. There does not appear to be any evidence of infection currently. No fevers, chills, nausea, or vomiting noted at this time. 04/30/2018 patient seen today for follow-up and management of bilateral lower extremity lymphedema. He did express being more sad today than usual due to the recent loss of his dog. He states that he has been compliant with using the lymphedema pumps. However he does admit a minute over the last 2-3 days he has not been using the pumps due to the recent loss of his dog. At this time there is no drainage or open wounds to his lower extremities. The left leg edema is measuring smaller today. Still has a significant amount of edema on bilateral lower extremities With dry flaky skin. He will be referred to the lymphedema clinic for further management. Will continue 3 layer compression wraps and follow-up in 1-2 weeks.Denies any pain, fever, chairs recently. No recent falls or injuries reported during this visit. 05/07/18 on evaluation today patient actually appears to be doing very well in regard to his lower extremities in general all things considered. With that being said he is having some pain in the legs just due to the amount of  swelling. He does have an area where he had a blister on the left lateral lower extremity this is open at this point other than that there's nothing else weeping at this time. 05/14/18 on evaluation today patient actually appears to be doing excellent all things considered in regard to his lower extremities. He still has a couple areas of weeping on each leg which has continued to be the issue for him. He does have an appointment with the lymphedema clinic although this isn't until February 2020. That was the earliest they had. In the meantime he has continued to tolerate the compression wraps without complication. 05/28/18 on evaluation today patient actually appears to be doing more poorly in regard to his left lower extremity where he has a wound open at this point. He also had a fall where he subsequently injured his right great toe which has led to an open wound at the site unfortunately. He has been tolerating the dressing changes without complication in general as far as the wraps are concerned that he has not been putting any dressing on the left 1st toe ulcer site. 06/11/18 on evaluation today patient appears to  be doing much worse in regard to his bilateral lower extremity ulcers. He has been tolerating the dressing changes without complication although his legs have not been wrapped more recently. Overall I am not very pleased with the way his legs appear. I do believe he needs to be back in compression wraps he still has not received his compression wraps from the [facility] hospital as of yet. 06/18/18 on evaluation today patient actually appears to be doing significantly better than last time I saw him. He has been tolerating the compression wraps without complication in the circumferential ulcers especially appear to be doing much better. His toe ulcer on the right in regard to the great toe is better although not as good as the legs in my pinion. No fevers chills noted 07/02/18 on evaluation  today patient appears to be doing much better in regard to his lower extremity ulcers. Unfortunately since I last saw him he's had the distal portion of his right great toe if he dated it sounds as if this actually went downhill very quickly. I had only seen him a few days prior and the toe did not appear to be infected at that point subsequently became infected very rapidly and it was decided by the surgeon that the distal portion of the toe needed to be removed. The patient seems to be doing well in this regard he tells me. With that being said his lower extremities are doing better from the standpoint of the wounds although he is significantly swollen at this point. 07/09/18 on evaluation today patient appears to be doing better in regard to the wounds on his lower extremities. In fact everything is almost completely healed he is just a small area on the left posterior lower extremity that is open at this point. 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 With that being said he needs some kind of compression it does sound like he may not be wearing his compression, that is the wraps, during the entire time between when he's here visit to visit. Apparently his wife took the current one off because it began to "fall apart". 07/16/18 on evaluation today patient appears to be extremely swollen especially in regard to his left lower extremity unfortunately. He also has a new skin tear over the left lower extremity and there's a smaller area on the right lower extremity as well. Unfortunately this seems to be due in part to blistering and fluid buildup in his leg. He did get the reduction wraps that were ordered by the [facility] hospital for him to go to lymphedema clinic. With that being said his wounds on the legs have not healed  to the point to where they would likely accept them as a patient lymphedema clinic currently. We need to try to get this to heal. With that being said he's been taking his wraps off which is not doing him any favors at this point. In fact this is probably quite counterproductive compared to what needs to occur. We will likely need to increase to a four layer compression wrap and continue to also utilize elevation and he has to keep the wraps on not take them off as he's been doing currently hasn't had a wrap on since Saturday. 07/23/18 on evaluation today patient appears to be doing much better in regard to  his bilateral lower extremities. In fact his left lower extremity which was the largest is actually 15 cm smaller today compared to what it was last time he was here in our clinic. This is obviously good news after just one week. Nonetheless the differences he actually kept the wraps on during the entire week this time. That's not typical for him. I do believe he understands a little bit better now the severity of the situation and why it's important for him to keep these wraps on. 07/30/18 on evaluation today patient actually appears to be doing rather well in regard to his lower extremities. His legs are much smaller than they have been in the past and he actually has only one very small rudder superficial region remaining that is not closed on the left lateral/posterior lower extremity even this is almost completely close. I do believe likely next week he will be healed without any complications. I do think we need to continue the wraps however this seems to be beneficial for him. I also think it may be a good time for [place] to go ahead and see about getting the appointment with the lymphedema clinic which is supposed to be made for him in order to keep this moving along and hopefully get them into compression wraps that will in the end help him to remain healed. 08/06/18 on evaluation today patient  actually appears to be doing very well in regard as bilateral lower extremities. In fact his wounds appear to be completely healed at this time. 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 He is in the process of getting appointment with the lymphedema clinic we have made this referral were just waiting to hear back on the schedule time. We need to follow up on that today as well. 08/13/18 on evaluation today patient actually appears to be doing very well in regard to his bilateral lower extremities there are no open wounds at this point. We are gonna go ahead and see about ordering the Velcro compression wraps for him had a discussion with them about [place] doing it versus the [place] they feel like they can definitely afford going ahead and get the wraps themselves and they would prefer to try to avoid having to go to the lymphedema clinic if it all possible which I completely understand. As long as he has good compression I'm okay either way. 3/17//20 on evaluation today patient actually appears to be doing well in regard to his bilateral lower extremity ulcers. He has been tolerating the dressing changes without complication specifically the compression wraps. Overall is had no issues my fingers finding that I see at this point is that he is having trouble with constipation. He tells me he has not been able to go to the bathroom for about six days. He's taken to over-the- counter oral laxatives unfortunately this is not helping. He has not contacted his doctor. Adam Merritt, Adam Merritt ([id number]) 08/27/18 on evaluation today patient appears to be doing fairly well in regard to his lower extremities at this point. There does not appear to be any new altars is swelling is very well controlled. We are still waiting for his Velcro compression wraps to arrive that should be sometime in the next week is Artie sent the check for these. 09/03/18 on evaluation today  patient appears to be doing excellent in regard to his bilateral lower extremities which he shows no signs of wound openings in regard to  this point. He does have his Velcro compression wraps which did arrive in the mail since I saw him last week. Overall he is doing excellent in my pinion. 09/13/18 on evaluation today patient appears to be doing very well currently in regard to the overall appearance of his bilateral lower extremities although he's a little bit more swollen than last time we saw him. At that point he been discharged without any open wounds. Nonetheless he has a small open wound on the posterior left lower extremity with some evidence of cellulitis noted as well. Fortunately I feel like he has made good progress overall with regard to his lower extremities from were things used to be. 09/20/18 on evaluation today patient actually appears to be doing much better. The erythematous lower extremity is improving wound itself which is still open appears to be doing much better as far as but appearance as well as pain is concerned overall very pleased in this regard. There's no signs of active infection at this time. 09/27/18 on evaluation today patient's wounds on the lower extremity actually appear to be doing fairly well at this time which is good news. There is no evidence of active infection currently and again is just as left lower extremity were there any wounds at this point anyway. I believe they may be completely healed but again I'm not 100% sure based on evaluation today. I think one more week of observation would likely be a good idea. 10/04/18 on evaluation today patient actually appears to be doing excellent in regard to the left lower extremity which actually appears to be completely healed as of today. Unfortunately he's been having issues with his right lower extremity have a new wound that has opened. Fortunately there's no evidence of active infection at this time which is  good news. 10/10/18 upon evaluation today patient actually appears to be doing a little bit worse with the new open area on his right posterior lower extremity. He's been tolerating the dressing changes without complication. Right now we been using his compression wraps although I think we may need to switch back to actually performing bilateral compression wraps are in the clinic. No fevers, chills, nausea, or vomiting noted at this time. 10/17/18 on evaluation today patient actually appears to be doing quite well in regard to his bilateral lower extremity ulcers. He has been tolerating the reps without complication although he would prefer not to rewrap his legs as of today. Fortunately there's no signs of active infection which is good news. No fevers, chills, nausea, or vomiting noted at this time. 10/24/18 on evaluation today patient appears to be doing very well in regard to his lower extremities. His right lower extremity is shown signs of healing and his left lower [place] though not healed appears to be improving which is excellent news. Overall very pleased with how things seem to be progressing at this time. The patient is likewise happy to hear this. His Velcro compression wraps however have not been put on properly will gonna show his wife how to do that properly today. 10/31/18 on evaluation today patient appears to be doing more poorly in regard to his left lower extremity in particular although both lower extremities actually are showing some signs of being worse than my previous evaluation. Unfortunately I'm just not sure that his compression stockings even with the use of the compression/lymphedema pumps seem to be controlling this well. Upon further questioning he tells me that he also is not able to  lie flat in the bed due to his congestive heart failure and difficulty breathing. For that reason he sleeps in his recliner. To make matters worse his recliner also cannot even hold his legs  up so instead of even being somewhat elevated they pretty much hang down to the floor. This is the way he sleeps each night which is definitely counterproductive to everything else that were attempting to do from the standpoint of controlling his fluid. Nonetheless I think that he potentially could benefit from a hospital bed although this would be something that his primary care provider would likely have to order since anything that is order on our side has to be directly related to wound care and again the hospital bed is not necessarily a direct relation to although I think it does contribute to his overall wound status on his lower extremities. 11/07/18 on evaluation today patient appears to be doing better in regard to his bilateral lower extremity. He's been tolerating the dressing changes without complication. We did in the interim since I last saw him switch to just using extras orbiting new alginate and that seems to have done much better for him. I'm very pleased with the overall progress that is made. 11/14/18 on evaluation today patient appears to be redoing rather well in regard to his left lower extremity ulcers which are the only ones remaining at this point. Fortunately there's no signs of active infection at this time which is good news. Overall been very pleased with how things seem to be progressing currently. No fevers, chills, nausea, or vomiting noted at this time. 11/20/17 on evaluation today patient actually appears to be doing quite well in regard to his lower extremities on the left on the right he has several blisters that showed up although there's some question about whether or not he has had his broker compression wraps on like he was supposed to or not. Fortunately there's no signs of active infection at this time which is good news. Unfortunately though he is doing well from the standpoint of the left leg the right leg is not doing as well again this is when we did not wrap  last week. 11/28/18 on evaluation today patient appears to be doing well in regard to his bilateral lower extremities is left appears to be healed is right is not healed but is very close to being so. Overall very pleased with how things seem to be progressing. Patient is likewise happy that things are doing well. 12/09/18 on evaluation today patient actually appears to be completely healed which is excellent news. He actually seems to be doing well in regard to the swelling of the bilateral lower extremities which is also great news. Overall very pleased with how things seem to be progressing. Readmission: 03/18/2019 upon evaluation today patient presents for reevaluation concerning issues that he is having with his left lower extremity where he does have a wound noted upon inspection today. He also has a wound on the right second toe on the tip where it is apparently been rubbing in his shoe I did look at issues and you can actually see where this has been occurring as well. Fortunately there is no signs of infection with regard to the toe necessarily although it is very swollen compared to normal that does have me somewhat concerned about the possibility of further evaluation for infection/osteomyelitis. I would recommend an x-ray to start with. Otherwise he states that it is been 1-2 weeks that he has  had the draining in regard to left lower extremity he does not know how this happened he has been wearing his compression appropriately and I do see that as well today but nonetheless I do think that he needs to continue to be very cautious with regard to elevation as well. Adam Merritt, Adam Merritt ([id number]) 03/25/2019 on evaluation today patient appears to be doing much better in regard to his lower extremity at this time. That is on the left. He did culture positive for Pseudomonas but the good news is he seems to be doing much better the gentamicin we applied topically seems to have done a great job for  him. There is no signs of active infection at this time systemically and locally he is doing much better. No fevers, chills, nausea, vomiting, or diarrhea. In regard to his right toe this seems to be doing much better there is very little pressure noted at this point I did clean up some of the callus today around the edges of the wound as well as the surface of the wound but to be honest he is progressing quite nicely. 10/30; the area on the left anterior lower tibia area is healed over. His edema control is adequate even though his use of the compression pumps seems very intermittent. He has a juxta lite stocking on the right leg and a think there is 1 for the left leg at home. His wife is putting these on. He has an area on the plantar right second toe which is a hammertoe they are trying to offload this 04/08/2019 on evaluation today patient appears to be doing well in regard to his lower extremities which is good news. We are seeing him today for his toe ulcer which is giving him a little bit more trouble but again still seems to be doing better at this point which is good news. He is going require some sharp debridement in regard to the toe today however. 04/15/2019 on evaluation today patient actually appears to be doing quite well with regard to his toe ulcer. I am very pleased with how things are doing in that regard. With regard to his lower extremity edema in general he tells me he is not been using the lymphedema pumps regularly although he does not use them. I think that he needs to use them more regularly he does have a small blister on the left lower extremity this is not open at this point but obviously it something that can get worse if he does not keep the compression going and the pumps going as well. 04/22/2019 on evaluation today patient appears to be doing well with regard to his toe ulcer. He has been tolerating the dressing changes without complication. This is measuring slightly  smaller this week as compared to last week. Fortunately there is no signs of active infection at this time. 05/06/2019 on evaluation today patient actually appears to be doing excellent. In fact his toe appears to be completely healed which is great news. There is no signs of active infection at this time. Readmission: Patient presents today for follow-up after having been discharged at the beginning of December 2020. He states that in recent weeks things have reopened and has been having more trouble at this point. With that being said fortunately there is no signs of active infection at this time. No fever chills noted. Nonetheless he also does have an issue with one of his toes as well that seems to be somewhat this is  on the right foot second toe. 07/25/2019 upon evaluation today patient's lower extremities appear to be doing excellent bilaterally. I really feel like he is showing signs of great improvement and in fact I think he is close to healing which is great news. There is no signs of active infection at this time. No fevers, chills, nausea, vomiting, or diarrhea. 07/31/2019 upon evaluation today patient appears to be doing excellent and in fact I think may be completely healed in regard to his right lower extremity that were still to monitor this 1 more week before closing it out. The left lower extremity is measuring smaller although not completely closed seems to be doing excellent. 08/07/2019 upon evaluation today patient appears to be doing excellent in regard to his wounds. In fact the right lower extremity is completely healed there is no issues here. The left lower extremity has 1 very small area still remaining fortunately there is no signs of infection and overall I think he is very close to closure of the here as well. 08/14/19 upon evaluation today patient appears to be doing excellent in regard to his lower extremities. In fact he appears to be completely healed as of today.  Fortunately there's no signs of active infection at this time. No fevers, chills, nausea, or vomiting noted at this time. READMISSION 09/26/2019 This is a 74 year old man who is well-known to this clinic having just been discharged on 08/14/2019. He has known lymphedema and chronic venous insufficiency. He has Farrow wrap stockings and also external compression pumps. He does not use the external compression pumps however. He does however apparently fairly faithfully use the stockings. They have not been lotion in his legs. He has developed new wounds on the right anterior as well as the left medial left lateral and left posterior calf. He returns for our review of this Past medical history includes coronary artery disease, hypertension, congestive heart failure, COPD, venous insufficiency with lymphedema, type 2 diabetes. He apparently has compression pumps, Farrow wraps and at 1 point a hospital bed although I believe he sleeps in a recliner 10/03/2019 upon evaluation today patient appears to be doing better with regard to his wounds in general. He has been tolerating the dressing changes without complication. Fortunately there is no signs of active infection. No fevers, chills, nausea, vomiting, or diarrhea. I do believe the compression wraps are helping as they always have in the past. 10/10/2019 upon evaluation today patient appears to be doing excellent at this point. His right lower extremity is measuring much better and in fact is completely healed. His left lower extremity is also measuring better though not healed seems to be doing excellent at this point which is great news. Overall I am very pleased with how things appear currently. 10/27/2019 upon evaluation today patient appears to be doing quite well with regard to his wounds currently. He has been tolerating the dressing changes without complication. Fortunately there is no signs of active infection at this time. In fact he is almost  completely healed on the left and has just a very small area open and on the right he is completely healed. 11/04/2019 upon evaluation today patient appears to be doing quite well with regard to his wounds. In fact he appears to be quite possibly completely healed on the left lower extremity now as well the right lower extremity is still doing well. With that being said unfortunately though this may be healed I think it is a very fragile area and I  cannot even confirm there is not a very small opening still remaining. I really think he may benefit from 1 additional weeks wrapping before discontinuing. 11/10/2019 upon evaluation today patient appears to be doing well in regard to his original wounds in fact everything that we were treating last week is completely healed on the left. Unfortunately he has a new skin tear just above where the wrap slipped down due to a fall he sustained causing this injury. Adam Merritt, Adam Merritt ([id number]) 11/17/2019 on evaluation today patient appears to be doing better with regard to his wound. He has been tolerating the dressing changes without complication. Fortunately there is no signs of active infection at this time. No fevers, chills, nausea, vomiting, or diarrhea. Electronic Signature(s) Signed: 11/17/2019 11:28:20 AM By: Worthy Keeler PA-C Entered By: Worthy Keeler on 11/17/2019 11:28:20 Adam Merritt ([id number]) -------------------------------------------------------------------------------- Physical Exam Details Patient Name: Adam Merritt Date of Service: 11/17/2019 10:45 AM Medical Record Number: [id number] Patient Account Number: [id number] Date of Birth/Sex: 10/23/1945 (74 y.o. M) Treating RN: Cornell Barman Primary Care Provider: [name] Other Clinician: Referring Provider: [name] Treating Provider/Extender: Melburn Hake, Darwyn Ponzo [name]: 7 Constitutional Well-nourished and well-hydrated in no acute distress. Respiratory normal  breathing without difficulty. Psychiatric this patient is able to make decisions and demonstrates good insight into disease process. Alert and Oriented x 3. pleasant and cooperative. Notes Patient's wound bed actually showed signs of good granulation there was minimal slough noted on the surface of the wound I was able to mechanically debride this away without any sharp debridement. Electronic Signature(s) Signed: 11/17/2019 11:30:05 AM By: Worthy Keeler PA-C Entered By: Worthy Keeler on 11/17/2019 11:30:05 Adam Merritt ([id number]) -------------------------------------------------------------------------------- Physician Orders Details Patient Name: Adam Merritt Date of Service: 11/17/2019 10:45 AM Medical Record Number: [id number] Patient Account Number: [id number] Date of Birth/Sex: 09/09/1945 (74 y.o. M) Treating RN: Montey Hora Primary Care Provider: [name] Other Clinician: Referring Provider: [name] Treating Provider/Extender: Melburn Hake, Lawrencia Mauney [name]: 7 Verbal / Phone Orders: No Diagnosis Coding ICD-10 Coding Code Description I89.0 Lymphedema, not elsewhere classified L97.222 Non-pressure chronic ulcer of left calf with fat layer exposed L97.812 Non-pressure chronic ulcer of other part of right lower leg with fat layer exposed Wound Cleansing Wound #35 Left Lower Leg o May shower with protection. - Do not get your wrap wet Primary Wound Dressing Wound #35 Left Lower Leg o Hydrafera Blue Ready Transfer Secondary Dressing Wound #35 Left Lower Leg o ABD pad Dressing Change Frequency o Change dressing every week Follow-up Appointments o Return Appointment in 1 week. Edema Control o 4-Layer Compression System - Left Lower Extremity. o Elevate legs to the level of the heart and pump ankles as often as possible o Compression Pump: Use compression pump on left lower extremity for 60 minutes, twice daily. o Compression Pump: Use  compression pump on right lower extremity for 60 minutes, twice daily. Electronic Signature(s) Unsigned Entered By: Montey Hora on 11/17/2019 11:08:10 Signature(s): Date(s): Adam Merritt ([id number]) -------------------------------------------------------------------------------- Problem List Details Patient Name: DANDREA, WIDDOWSON Date of Service: 11/17/2019 10:45 AM Medical Record Number: [id number] Patient Account Number: [id number] Date of Birth/Sex: 03-Feb-1946 (74 y.o. M) Treating RN: Cornell Barman Primary Care Provider: [name] Other Clinician: Referring Provider: [name] Treating Provider/Extender: Melburn Hake, Enora Trillo [name]: 7 Active Problems ICD-10 Encounter Code Description Active Date MDM Diagnosis I89.0 Lymphedema, not elsewhere classified 09/26/2019 No Yes L97.222 Non-pressure chronic ulcer of left  calf with fat layer exposed 09/26/2019 No Yes L97.812 Non-pressure chronic ulcer of other part of right lower leg with fat layer 09/26/2019 No Yes exposed Inactive Problems Resolved Problems Electronic Signature(s) Signed: 11/17/2019 11:05:11 AM By: Worthy Keeler PA-C Entered By: Worthy Keeler on 11/17/2019 11:05:10 Adam Merritt ([id number]) -------------------------------------------------------------------------------- Progress Note Details Patient Name: Adam Merritt Date of Service: 11/17/2019 10:45 AM Medical Record Number: [id number] Patient Account Number: [id number] Date of Birth/Sex: 1945/07/14 (74 y.o. M) Treating RN: Cornell Barman Primary Care Provider: [name] Other Clinician: Referring Provider: [name] Treating Provider/Extender: Melburn Hake, Yoshika Vensel [name]: 7 Subjective Chief Complaint Information obtained from Patient Bilateral LE Ulcers and right second toe ulcer 09/26/2019; patient is here for review bilateral lower leg wounds. He is well familiar to this clinic History of Present Illness (HPI) 10/18/17-He is here for  initial evaluation of bilateral lower extremity ulcerations in the presence of venous insufficiency and lymphedema. He has been seen by vascular medicine in the past, Dr. Lucky Cowboy, last seen in 2016. He does have a history of abnormal ABIs, which is to be expected given his lymphedema and venous insufficiency. According to Epic, it appears that all attempts for arterial evaluation and/or angiography were not follow through with by patient. He does have a history of being seen in lymphedema clinic in 2018, stopped going approximately 6 months ago stating "it didn't do any good". He does not have lymphedema pumps, he does not have custom fit compression wrap/stockings. He is diabetic and his recent A1c last month was 7.6. He admits to chronic bilateral lower extremity pain, no change in pain since blister and ulceration development. He is currently being treated with Levaquin for bronchitis. He has home health and we will continue. 10/25/17-He is here in follow-up evaluation for bilateral lower extremity ulcerationssubtle he remains on Levaquin for bronchitis. Right lower extremity with no evidence of drainage or ulceration, persistent left lower extremity ulceration. He states that home health has not been out since his appointment. He went to [facility] on Tuesday, studies revealed: RIGHT ABI 0.9, TBI 0.6 LEFT ABI 1.1, TBI 0.6 with triphasic flow bilaterally. We will continue his same treatment plan. He has been educated on compression therapy and need for elevation. He will benefit from lymphedema pumps 11/01/17-He is here in follow-up evaluation for left lower extremity ulcer. The right lower extremity remains healed. He has home health services, but they have not been out to see the patient for 2-3 weeks. He states it home health physical therapy changed his dressing yesterday after therapy; he placed Ace wrap compression. We are still waiting for lymphedema pumps, reordered d/t need for  company change. 11/08/17-He is here in follow-up evaluation for left lower extremity ulcer. It is improved. Edema is significantly improved with compression therapy. We will continue with same treatment plan and he will follow-up next week. No word regarding lymphedema pumps 11/15/17-He is here in follow-up evaluation for left lower extremity ulcer. He is healed and will be discharged from wound care services. I have reached out to [facility] regarding his lymphedema pumps. They have been unable to reach the patient; the contact number they had with the patient's wife's cell phone and she has not answered any unrecognized calls. Contact should be made today, trial planned for next week; [facility] will continue to follow 11/27/17 on evaluation today patient has multiple blistered areas over the right lower extremity his left lower extremity appears to be doing  okay. These blistered areas show signs of no infection which is great news. With that being said he did have some necrotic skin overlying which was mechanically debrided away with saline and gauze today without complication. Overall post debridement the wounds appear to be doing better but in general his swelling seems to be increased. This is obviously not good news. I think this is what has given rise to the blisters. 12/04/17 on evaluation today patient presents for follow-up concerning his bilateral lower extremity edema in the right lower extremity ulcers. He has been tolerating the dressing changes without complication. With that being said he has had no real issues with the wraps which is also good news. Overall I'm pleased with the progress he's been making. 12/11/17 on evaluation today patient appears to be doing rather well in regard to his right lateral lower extremity ulcer. He's been tolerating the dressing changes without complication. Fortunately there does not appear to be any evidence of infection at this time. Overall I'm  pleased with the progress that is being made. Unfortunately he has been in the hospital due to having what sounds to be a stomach virus/flu fortunately that is starting to get better. 12/18/17 on evaluation today patient actually appears to be doing very well in regard to his bilateral lower extremities the swelling is under fairly good control his lymphedema pumps are still not up and running quite yet. With that being said he does have several areas of opening noted as far as wounds are concerned mainly over the left lower extremity. With that being said I do believe once he gets lymphedema pumps this would least help you mention some the fluid and preventing this from occurring. Hopefully that will be set up soon sleeves are Artie in place at his home he just waiting for the machine. 12/25/17 on evaluation today patient actually appears to be doing excellent in fact all of his ulcers appear to have resolved his legs appear very well. I do think he needs compression stockings we have discussed this and they are actually going to go to [facility] today to elastic therapy to get this fitted for him. I think that is definitely a good thing to do. Readmission: 04/09/18 upon evaluation today this patient is seen for readmission due to bilateral lower extremity lymphedema. He has significant swelling of his extremities especially on the left although the right is also swollen he has weeping from both sides. There are no obvious open wounds at this point. Fortunately he has been doing fairly well for quite a bit of time since I last saw him. Nonetheless unfortunately this seems to have reopened and is giving quite a bit of trouble. He states this began about a week ago when he first called [place] to get in to be seen. No fevers, chills, nausea, or vomiting noted at this time. He has not been using his lymphedema pumps due to the fact that they won't fit on his leg at this point likewise is also not been using his  compression for essentially the same reason. 04/16/18 upon evaluation today patient actually appears to be doing a little better in regard to the fluid in his bilateral lower extremities. With that being said he's had three falls since I saw him last week. He also states that he's been feeling very poorly. I was concerned last week and feel like that the concern is still there as far as the congestion in his chest is concerned he seems to be  breathing about the same as last week but again he states he's very weak he's not even able to walk further than from the chair to the door. His wife had to buy a wheelchair just to be able to get them out of the house to get to the appointment today. This has me very concerned. Adam Merritt, Adam Merritt ([id number]) 04/23/18 on evaluation today patient actually appears to be doing much better than last week's evaluation. At that time actually had to transport him to the ER via EMS and he subsequently was admitted for acute pulmonary edema, acute renal failure, and acute congestive heart failure. Fortunately he is doing much better. Apparently they did dialyze him and were able to take off roughly 35 pounds of fluid. Nonetheless he is feeling much better both in regard to his breathing and he's able to get around much better at this time compared to previous. Overall I'm very happy with how things are at this time. There does not appear to be any evidence of infection currently. No fevers, chills, nausea, or vomiting noted at this time. 04/30/2018 patient seen today for follow-up and management of bilateral lower extremity lymphedema. He did express being more sad today than usual due to the recent loss of his dog. He states that he has been compliant with using the lymphedema pumps. However he does admit a minute over the last 2-3 days he has not been using the pumps due to the recent loss of his dog. At this time there is no drainage or open wounds to his lower  extremities. The left leg edema is measuring smaller today. Still has a significant amount of edema on bilateral lower extremities With dry flaky skin. He will be referred to the lymphedema clinic for further management. Will continue 3 layer compression wraps and follow-up in 1-2 weeks.Denies any pain, fever, chairs recently. No recent falls or injuries reported during this visit. 05/07/18 on evaluation today patient actually appears to be doing very well in regard to his lower extremities in general all things considered. With that being said he is having some pain in the legs just due to the amount of swelling. He does have an area where he had a blister on the left lateral lower extremity this is open at this point other than that there's nothing else weeping at this time. 05/14/18 on evaluation today patient actually appears to be doing excellent all things considered in regard to his lower extremities. He still has a couple areas of weeping on each leg which has continued to be the issue for him. He does have an appointment with the lymphedema clinic although this isn't until February 2020. That was the earliest they had. In the meantime he has continued to tolerate the compression wraps without complication. 05/28/18 on evaluation today patient actually appears to be doing more poorly in regard to his left lower extremity where he has a wound open at this point. He also had a fall where he subsequently injured his right great toe which has led to an open wound at the site unfortunately. He has been tolerating the dressing changes without complication in general as far as the wraps are concerned that he has not been putting any dressing on the left 1st toe ulcer site. 06/11/18 on evaluation today patient appears to be doing much worse in regard to his bilateral lower extremity ulcers. He has been tolerating the dressing changes without complication although his legs have not been wrapped more  recently.  Overall I am not very pleased with the way his legs appear. I do believe he needs to be back in compression wraps he still has not received his compression wraps from the [facility] hospital as of yet. 06/18/18 on evaluation today patient actually appears to be doing significantly better than last time I saw him. He has been tolerating the compression wraps without complication in the circumferential ulcers especially appear to be doing much better. His toe ulcer on the right in regard to the great toe is better although not as good as the legs in my pinion. No fevers chills noted 07/02/18 on evaluation today patient appears to be doing much better in regard to his lower extremity ulcers. Unfortunately since I last saw him he's had the distal portion of his right great toe if he dated it sounds as if this actually went downhill very quickly. I had only seen him a few days prior and the toe did not appear to be infected at that point subsequently became infected very rapidly and it was decided by the surgeon that the distal portion of the toe needed to be removed. The patient seems to be doing well in this regard he tells me. With that being said his lower extremities are doing better from the standpoint of the wounds although he is significantly swollen at this point. 07/09/18 on evaluation today patient appears to be doing better in regard to the wounds on his lower extremities. In fact everything is almost completely healed he is just a small area on the left posterior lower extremity that is open at this point. 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 With that being said he needs some kind of compression it does sound like he may not be wearing his compression, that is the wraps, during the entire time between when  he's here visit to visit. Apparently his wife took the current one off because it began to "fall apart". 07/16/18 on evaluation today patient appears to be extremely swollen especially in regard to his left lower extremity unfortunately. He also has a new skin tear over the left lower extremity and there's a smaller area on the right lower extremity as well. Unfortunately this seems to be due in part to blistering and fluid buildup in his leg. He did get the reduction wraps that were ordered by the [facility] for him to go to lymphedema clinic. With that being said his wounds on the legs have not healed to the point to where they would likely accept them as a patient lymphedema clinic currently. We need to try to get this to heal. With that being said he's been taking his wraps off which is not doing him any favors at this point. In fact this is probably quite counterproductive compared to what needs to occur. We will likely need to increase to a four layer compression wrap and continue to also utilize elevation and he has to keep the wraps on not take them off as he's been doing currently hasn't had a wrap on since Saturday. 07/23/18 on evaluation today patient appears to be doing much better in regard to his bilateral lower extremities. In fact his left lower extremity which was the largest is actually 15 cm smaller today compared to what it was last time he was here in  our clinic. This is obviously good news after just one week. Nonetheless the differences he actually kept the wraps on during the entire week this time. That's not typical for him. I do believe he understands a little bit better now the severity of the situation and why it's important for him to keep these wraps on. 07/30/18 on evaluation today patient actually appears to be doing rather well in regard to his lower extremities. His legs are much smaller than they have been in the past and he actually has only one very small rudder  superficial region remaining that is not closed on the left lateral/posterior lower extremity even this is almost completely close. I do believe likely next week he will be healed without any complications. I do think we need to continue the wraps however this seems to be beneficial for him. I also think it may be a good time for [place] to go ahead and see about getting the appointment with the lymphedema clinic which is supposed to be made for him in order to keep this moving along and hopefully get them into compression wraps that will in the end help him to remain healed. 08/06/18 on evaluation today patient actually appears to be doing very well in regard as bilateral lower extremities. In fact his wounds appear to be completely healed at this time. 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 He is in the process of getting appointment with the lymphedema clinic we have made this referral were just waiting to hear back on the schedule time. We need to follow up on that today as well. 08/13/18 on evaluation today patient actually appears to be doing very well in regard to his bilateral lower extremities there are no open wounds at this point. We are gonna go ahead and see about ordering the Velcro compression wraps for him had a discussion with them about [place] doing it versus the [place] they feel like they can definitely afford going ahead and get the wraps themselves and they would prefer to try to avoid having to go to the lymphedema clinic if it all possible which I completely understand. As long as he has good compression I'm okay either way. Adam Merritt, Adam Merritt ([id number]) 3/17//20 on evaluation today patient actually appears to be doing well in regard to his bilateral lower extremity ulcers. He has been tolerating the dressing changes without complication specifically the compression wraps. Overall is had no issues my fingers finding that I see at  this point is that he is having trouble with constipation. He tells me he has not been able to go to the bathroom for about six days. He's taken to over-the- counter oral laxatives unfortunately this is not helping. He has not contacted his doctor. 08/27/18 on evaluation today patient appears to be doing fairly well in regard to his lower extremities at this point. There does not appear to be any new altars is swelling is very well controlled. We are still waiting for his Velcro compression wraps to arrive that should be sometime in the next week is Artie sent the check for these. 09/03/18 on evaluation today patient appears to be doing excellent in regard to his bilateral lower extremities which he shows no signs of wound openings in regard to this point. He does have his Velcro compression wraps which did arrive in the mail since I saw him last week. Overall he is doing excellent in my pinion. 09/13/18 on  evaluation today patient appears to be doing very well currently in regard to the overall appearance of his bilateral lower extremities although he's a little bit more swollen than last time we saw him. At that point he been discharged without any open wounds. Nonetheless he has a small open wound on the posterior left lower extremity with some evidence of cellulitis noted as well. Fortunately I feel like he has made good progress overall with regard to his lower extremities from were things used to be. 09/20/18 on evaluation today patient actually appears to be doing much better. The erythematous lower extremity is improving wound itself which is still open appears to be doing much better as far as but appearance as well as pain is concerned overall very pleased in this regard. There's no signs of active infection at this time. 09/27/18 on evaluation today patient's wounds on the lower extremity actually appear to be doing fairly well at this time which is good news. There is no evidence of active  infection currently and again is just as left lower extremity were there any wounds at this point anyway. I believe they may be completely healed but again I'm not 100% sure based on evaluation today. I think one more week of observation would likely be a good idea. 10/04/18 on evaluation today patient actually appears to be doing excellent in regard to the left lower extremity which actually appears to be completely healed as of today. Unfortunately he's been having issues with his right lower extremity have a new wound that has opened. Fortunately there's no evidence of active infection at this time which is good news. 10/10/18 upon evaluation today patient actually appears to be doing a little bit worse with the new open area on his right posterior lower extremity. He's been tolerating the dressing changes without complication. Right now we been using his compression wraps although I think we may need to switch back to actually performing bilateral compression wraps are in the clinic. No fevers, chills, nausea, or vomiting noted at this time. 10/17/18 on evaluation today patient actually appears to be doing quite well in regard to his bilateral lower extremity ulcers. He has been tolerating the reps without complication although he would prefer not to rewrap his legs as of today. Fortunately there's no signs of active infection which is good news. No fevers, chills, nausea, or vomiting noted at this time. 10/24/18 on evaluation today patient appears to be doing very well in regard to his lower extremities. His right lower extremity is shown signs of healing and his left lower Trinity though not healed appears to be improving which is excellent news. Overall very pleased with how things seem to be progressing at this time. The patient is likewise happy to hear this. His Velcro compression wraps however have not been put on properly will gonna show his wife how to do that properly today. 10/31/18 on  evaluation today patient appears to be doing more poorly in regard to his left lower extremity in particular although both lower extremities actually are showing some signs of being worse than my previous evaluation. Unfortunately I'm just not sure that his compression stockings even with the use of the compression/lymphedema pumps seem to be controlling this well. Upon further questioning he tells me that he also is not able to lie flat in the bed due to his congestive heart failure and difficulty breathing. For that reason he sleeps in his recliner. To make matters worse his recliner also cannot  even hold his legs up so instead of even being somewhat elevated they pretty much hang down to the floor. This is the way he sleeps each night which is definitely counterproductive to everything else that were attempting to do from the standpoint of controlling his fluid. Nonetheless I think that he potentially could benefit from a hospital bed although this would be something that his primary care provider would likely have to order since anything that is order on our side has to be directly related to wound care and again the hospital bed is not necessarily a direct relation to although I think it does contribute to his overall wound status on his lower extremities. 11/07/18 on evaluation today patient appears to be doing better in regard to his bilateral lower extremity. He's been tolerating the dressing changes without complication. We did in the interim since I last saw him switch to just using extras orbiting new alginate and that seems to have done much better for him. I'm very pleased with the overall progress that is made. 11/14/18 on evaluation today patient appears to be redoing rather well in regard to his left lower extremity ulcers which are the only ones remaining at this point. Fortunately there's no signs of active infection at this time which is good news. Overall been very pleased with how  things seem to be progressing currently. No fevers, chills, nausea, or vomiting noted at this time. 11/20/17 on evaluation today patient actually appears to be doing quite well in regard to his lower extremities on the left on the right he has several blisters that showed up although there's some question about whether or not he has had his broker compression wraps on like he was supposed to or not. Fortunately there's no signs of active infection at this time which is good news. Unfortunately though he is doing well from the standpoint of the left leg the right leg is not doing as well again this is when we did not wrap last week. 11/28/18 on evaluation today patient appears to be doing well in regard to his bilateral lower extremities is left appears to be healed is right is not healed but is very close to being so. Overall very pleased with how things seem to be progressing. Patient is likewise happy that things are doing well. 12/09/18 on evaluation today patient actually appears to be completely healed which is excellent news. He actually seems to be doing well in regard to the swelling of the bilateral lower extremities which is also great news. Overall very pleased with how things seem to be progressing. Readmission: 03/18/2019 upon evaluation today patient presents for reevaluation concerning issues that he is having with his left lower extremity where he does have a wound noted upon inspection today. He also has a wound on the right second toe on the tip where it is apparently been rubbing in his shoe I did look at issues and you can actually see where this has been occurring as well. Fortunately there is no signs of infection with regard to Adam Merritt, Adam Merritt. ([id number]) the toe necessarily although it is very swollen compared to normal that does have me somewhat concerned about the possibility of further evaluation for infection/osteomyelitis. I would recommend an x-ray to start with. Otherwise  he states that it is been 1-2 weeks that he has had the draining in regard to left lower extremity he does not know how this happened he has been wearing his compression appropriately and I do  see that as well today but nonetheless I do think that he needs to continue to be very cautious with regard to elevation as well. 03/25/2019 on evaluation today patient appears to be doing much better in regard to his lower extremity at this time. That is on the left. He did culture positive for Pseudomonas but the good news is he seems to be doing much better the gentamicin we applied topically seems to have done a great job for him. There is no signs of active infection at this time systemically and locally he is doing much better. No fevers, chills, nausea, vomiting, or diarrhea. In regard to his right toe this seems to be doing much better there is very little pressure noted at this point I did clean up some of the callus today around the edges of the wound as well as the surface of the wound but to be honest he is progressing quite nicely. 10/30; the area on the left anterior lower tibia area is healed over. His edema control is adequate even though his use of the compression pumps seems very intermittent. He has a juxta lite stocking on the right leg and a think there is 1 for the left leg at home. His wife is putting these on. He has an area on the plantar right second toe which is a hammertoe they are trying to offload this 04/08/2019 on evaluation today patient appears to be doing well in regard to his lower extremities which is good news. We are seeing him today for his toe ulcer which is giving him a little bit more trouble but again still seems to be doing better at this point which is good news. He is going require some sharp debridement in regard to the toe today however. 04/15/2019 on evaluation today patient actually appears to be doing quite well with regard to his toe ulcer. I am very pleased  with how things are doing in that regard. With regard to his lower extremity edema in general he tells me he is not been using the lymphedema pumps regularly although he does not use them. I think that he needs to use them more regularly he does have a small blister on the left lower extremity this is not open at this point but obviously it something that can get worse if he does not keep the compression going and the pumps going as well. 04/22/2019 on evaluation today patient appears to be doing well with regard to his toe ulcer. He has been tolerating the dressing changes without complication. This is measuring slightly smaller this week as compared to last week. Fortunately there is no signs of active infection at this time. 05/06/2019 on evaluation today patient actually appears to be doing excellent. In fact his toe appears to be completely healed which is great news. There is no signs of active infection at this time. Readmission: Patient presents today for follow-up after having been discharged at the beginning of December 2020. He states that in recent weeks things have reopened and has been having more trouble at this point. With that being said fortunately there is no signs of active infection at this time. No fever chills noted. Nonetheless he also does have an issue with one of his toes as well that seems to be somewhat this is on the right foot second toe. 07/25/2019 upon evaluation today patient's lower extremities appear to be doing excellent bilaterally. I really feel like he is showing signs of great improvement and  in fact I think he is close to healing which is great news. There is no signs of active infection at this time. No fevers, chills, nausea, vomiting, or diarrhea. 07/31/2019 upon evaluation today patient appears to be doing excellent and in fact I think may be completely healed in regard to his right lower extremity that were still to monitor this 1 more week before closing  it out. The left lower extremity is measuring smaller although not completely closed seems to be doing excellent. 08/07/2019 upon evaluation today patient appears to be doing excellent in regard to his wounds. In fact the right lower extremity is completely healed there is no issues here. The left lower extremity has 1 very small area still remaining fortunately there is no signs of infection and overall I think he is very close to closure of the here as well. 08/14/19 upon evaluation today patient appears to be doing excellent in regard to his lower extremities. In fact he appears to be completely healed as of today. Fortunately there's no signs of active infection at this time. No fevers, chills, nausea, or vomiting noted at this time. READMISSION 09/26/2019 This is a 74 year old man who is well-known to this clinic having just been discharged on 08/14/2019. He has known lymphedema and chronic venous insufficiency. He has Farrow wrap stockings and also external compression pumps. He does not use the external compression pumps however. He does however apparently fairly faithfully use the stockings. They have not been lotion in his legs. He has developed new wounds on the right anterior as well as the left medial left lateral and left posterior calf. He returns for our review of this Past medical history includes coronary artery disease, hypertension, congestive heart failure, COPD, venous insufficiency with lymphedema, type 2 diabetes. He apparently has compression pumps, Farrow wraps and at 1 point a hospital bed although I believe he sleeps in a recliner 10/03/2019 upon evaluation today patient appears to be doing better with regard to his wounds in general. He has been tolerating the dressing changes without complication. Fortunately there is no signs of active infection. No fevers, chills, nausea, vomiting, or diarrhea. I do believe the compression wraps are helping as they always have in the  past. 10/10/2019 upon evaluation today patient appears to be doing excellent at this point. His right lower extremity is measuring much better and in fact is completely healed. His left lower extremity is also measuring better though not healed seems to be doing excellent at this point which is great news. Overall I am very pleased with how things appear currently. 10/27/2019 upon evaluation today patient appears to be doing quite well with regard to his wounds currently. He has been tolerating the dressing changes without complication. Fortunately there is no signs of active infection at this time. In fact he is almost completely healed on the left and has just a very small area open and on the right he is completely healed. 11/04/2019 upon evaluation today patient appears to be doing quite well with regard to his wounds. In fact he appears to be quite possibly completely healed on the left lower extremity now as well the right lower extremity is still doing well. With that being said unfortunately though this may be healed I think it is a very fragile area and I cannot even confirm there is not a very small opening still remaining. I really think he may benefit from 1 additional weeks wrapping before discontinuing. Adam Merritt, Adam Merritt ([id number]) 11/10/2019 upon  evaluation today patient appears to be doing well in regard to his original wounds in fact everything that we were treating last week is completely healed on the left. Unfortunately he has a new skin tear just above where the wrap slipped down due to a fall he sustained causing this injury. 11/17/2019 on evaluation today patient appears to be doing better with regard to his wound. He has been tolerating the dressing changes without complication. Fortunately there is no signs of active infection at this time. No fevers, chills, nausea, vomiting, or diarrhea. Objective Constitutional Well-nourished and well-hydrated in no acute distress. Vitals Time  Taken: 10:55 AM, Temperature: 98.7 F, Pulse: 68 bpm, Respiratory Rate: 20 breaths/min, Blood Pressure: 171/70 mmHg. Respiratory normal breathing without difficulty. Psychiatric this patient is able to make decisions and demonstrates good insight into disease process. Alert and Oriented x 3. pleasant and cooperative. General Notes: Patient's wound bed actually showed signs of good granulation there was minimal slough noted on the surface of the wound I was able to mechanically debride this away without any sharp debridement. Integumentary (Hair, Skin) Wound #35 status is Open. Original cause of wound was Trauma. The wound is located on the Left Lower Leg. The wound measures 2.6cm length x 1.7cm width x 0.1cm depth; 3.471cm^2 area and 0.347cm^3 volume. There is Fat Layer (Subcutaneous Tissue) Exposed exposed. There is no tunneling or undermining noted. There is a medium amount of serous drainage noted. The wound margin is flat and intact. There is large (67- 100%) pink granulation within the wound bed. There is no necrotic tissue within the wound bed. 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 There was a Four Layer Compression Therapy Procedure by Montey Hora, RN. Post procedure Diagnosis Wound #35: Same as Pre-Procedure Plan Wound Cleansing: Wound #35 Left Lower Leg: May shower with protection. - Do not get your wrap wet Primary Wound Dressing: BROOK, MALL ([id number]) Wound #35 Left Lower Leg: Hydrafera Blue Ready Transfer Secondary Dressing: Wound #35 Left Lower Leg: ABD pad Dressing Change Frequency: Change dressing every week Follow-up Appointments: Return Appointment in 1 week. Edema  Control: 4-Layer Compression System - Left Lower Extremity. Elevate legs to the level of the heart and pump ankles as often as possible Compression Pump: Use compression pump on left lower extremity for 60 minutes, twice daily. Compression Pump: Use compression pump on right lower extremity for 60 minutes, twice daily. 1. I would recommend currently that we go ahead and proceed with the current wound care measures although we will switch to [facility] in place of the silver cell as I feel like this may do better as far as not trapping fluid at the surface of the wound. He is in agreement with this plan. 2. We will also continue with the left lower extremity 4-layer compression wrap. 3. We will use his new Farrow wrap on the right lower extremity. We will see patient back for reevaluation in 1 week here in the clinic. If anything worsens or changes patient will contact our office for additional recommendations. Electronic Signature(s) Signed: 11/17/2019 11:30:50 AM By: Worthy Keeler PA-C Entered By: Worthy Keeler on 11/17/2019 11:30:50 Adam Merritt ([id number]) -------------------------------------------------------------------------------- SuperBill Details Patient Name: Adam Merritt. Date of  Service: 11/17/2019 Medical Record Number: [id number] Patient Account Number: [id number] Date of Birth/Sex: [birth year] (73 y.o. M) Treating RN: Montey Hora Primary Care Provider: [name] Other Clinician: Referring Provider: [name] Treating Provider/Extender: Melburn Hake, Kahlea Cobert [name]: 7 Diagnosis Coding ICD-10 Codes Code Description I89.0 Lymphedema, not elsewhere classified L97.222 Non-pressure chronic ulcer of left calf with fat layer exposed L97.812 Non-pressure chronic ulcer of other part of right lower leg with fat layer exposed Facility Procedures CPT4 Code: [id number] Description: (Facility Use Only) [id number] - [place] LWR LT  LEG Modifier: Quantity: 1 Physician Procedures CPT4 Code: [id number] Description: 31517 - WC PHYS LEVEL 3 - EST PT Modifier: Quantity: 1 CPT4 Code: Description: ICD-10 Diagnosis Description I89.0 Lymphedema, not elsewhere classified L97.222 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

## 2019-11-17 NOTE — Progress Notes (Signed)
[name], [name] ([id number]) Visit Report for 11/17/2019 Arrival Information Details Patient Name: Adam Merritt, Adam Merritt Date of Service: 11/17/2019 10:45 AM Medical Record Number: [id number] Patient Account Number: [id number] Date of Birth/Sex: Sep 13, 1945 (74 y.o. M) Treating RN: Montey Hora Primary Care Nickoles Gregori: [name] Other Clinician: Referring Oriah Leinweber: [name] Treating Merion Grimaldo/Extender: Melburn Hake, [name]: 7 Visit Information History Since Last Visit Added or deleted any medications: No Patient Arrived: Cane Any new allergies or adverse reactions: No Arrival Time: 10:51 Had a fall or experienced change in No Accompanied By: wife activities of daily living that may affect Transfer Assistance: None risk of falls: Patient Identification Verified: Yes Signs or symptoms of abuse/neglect since last visito No Secondary Verification Process Completed: Yes Hospitalized since last visit: No Patient Has Alerts: Yes Implantable device outside of the clinic excluding No Patient Alerts: Patient on Blood Thinner cellular tissue based products placed in the center DMII since last visit: Aspirin 325 Has Dressing in Place as Prescribed: Yes Has Compression in Place as Prescribed: Yes Pain Present Now: No Electronic Signature(s) Signed: 11/17/2019 12:43:42 PM By: Montey Hora Entered By: Montey Hora on 11/17/2019 10:51:46 Adam Merritt ([id number]) -------------------------------------------------------------------------------- Compression Therapy Details Patient Name: Adam Merritt Date of Service: 11/17/2019 10:45 AM Medical Record Number: [id number] Patient Account Number: [id number] Date of Birth/Sex: November 27, 1945 (74 y.o. M) Treating RN: Montey Hora Primary Care Jesus Poplin: [name] Other Clinician: Referring Fenix Ruppe: [name] Treating Deysha Cartier/Extender: Melburn Hake, [name]: 7 Compression Therapy Performed for Wound Assessment:  Wound #35 Left Lower Leg Performed By: Clinician Montey Hora, RN Compression Type: Four Layer Post Procedure Diagnosis Same as Pre-procedure Electronic Signature(s) Signed: 11/17/2019 12:43:42 PM By: Montey Hora Entered By: Montey Hora on 11/17/2019 11:07:09 Adam Merritt ([id number]) -------------------------------------------------------------------------------- Encounter Discharge Information Details Patient Name: Adam Merritt Date of Service: 11/17/2019 10:45 AM Medical Record Number: [id number] Patient Account Number: [id number] Date of Birth/Sex: Mar 16, 1946 (74 y.o. M) Treating RN: Montey Hora Primary Care Holli Rengel: [name] Other Clinician: Referring Madysyn Hanken: [name] Treating Leverne Tessler/Extender: Melburn Hake, [name]: 7 Encounter Discharge Information Items Discharge Condition: Stable Ambulatory Status: Cane Discharge Destination: Home Transportation: Private Auto Accompanied By: wife Schedule Follow-up Appointment: Yes Clinical Summary of Care: Electronic Signature(s) Signed: 11/17/2019 12:43:42 PM By: Montey Hora Entered By: Montey Hora on 11/17/2019 11:09:22 Adam Merritt ([id number]) -------------------------------------------------------------------------------- Lower Extremity Assessment Details Patient Name: Adam Merritt Date of Service: 11/17/2019 10:45 AM Medical Record Number: [id number] Patient Account Number: [id number] Date of Birth/Sex: 1946/03/05 (74 y.o. M) Treating RN: Montey Hora Primary Care Reyann Troop: [name] Other Clinician: Referring Trinitee Horgan: [name] Treating Nisha Dhami/Extender: Melburn Hake, [name]: 7 Edema Assessment Assessed: [Left: No] [Right: No] Edema: [Left: Ye] [Right: s] Calf Left: Right: Point of Measurement: 31 cm From Medial Instep 55.5 cm cm Ankle Left: Right: Point of Measurement: 11 cm From Medial Instep 32.5 cm cm Vascular Assessment Pulses: Dorsalis  Pedis Palpable: [Left:Yes] Electronic Signature(s) Signed: 11/17/2019 12:43:42 PM By: Montey Hora Entered By: Montey Hora on 11/17/2019 11:00:00 Adam Merritt ([id number]) -------------------------------------------------------------------------------- Multi Wound Chart Details Patient Name: Adam Merritt Date of Service: 11/17/2019 10:45 AM Medical Record Number: [id number] Patient Account Number: [id number] Date of Birth/Sex: [birth year] (74 y.o. M) Treating RN: Montey Hora Primary Care Araseli Sherry: [name] Other Clinician: Referring Tashon Capp: [name] Treating Raimundo Corbit/Extender: Melburn Hake, [name]: 7 Vital Signs Height(in): Pulse(bpm): 68 Weight(lbs): Blood Pressure(mmHg): 171/70 Body Mass Index(BMI): Temperature(F): 98.7 Respiratory  Rate(breaths/min): 20 Photos: [N/A:N/A] Wound Location: Left Lower Leg N/A N/A Wounding Event: Trauma N/A N/A Primary Etiology: Skin Tear N/A N/A Comorbid History: Anemia, Lymphedema, Chronic N/A N/A Obstructive Pulmonary Disease (COPD), Sleep Apnea, Congestive Heart Failure, Coronary Artery Disease, Hypertension, Peripheral Venous Disease, Type II Diabetes, Osteoarthritis, Neuropathy, Received Chemotherapy Date Acquired: 11/07/2019 N/A N/A Weeks of Treatment: 1 N/A N/A Wound Status: Open N/A N/A Measurements L x W x D (cm) 2.6x1.7x0.1 N/A N/A Area (cm) : 3.471 N/A N/A Volume (cm) : 0.347 N/A N/A % Reduction in Area: 35.20% N/A N/A % Reduction in Volume: 35.30% N/A N/A Classification: Full Thickness Without Exposed N/A N/A Support Structures Exudate Amount: Medium N/A N/A Exudate Type: Serous N/A N/A Exudate Color: amber N/A N/A Wound Margin: Flat and Intact N/A N/A Granulation Amount: Large (67-100%) N/A N/A Granulation Quality: Pink N/A N/A Necrotic Amount: None Present (0%) N/A N/A Exposed Structures: Fat Layer (Subcutaneous Tissue) N/A N/A Exposed: Yes Fascia: No Tendon: No Muscle: No Joint:  No Bone: No Epithelialization: Small (1-33%) N/A N/A Treatment Notes Electronic Signature(s) Adam Merritt, Adam Merritt ([id number]) Signed: 11/17/2019 12:43:42 PM By: Montey Hora Entered By: Montey Hora on 11/17/2019 11:06:35 Adam Merritt ([id number]) -------------------------------------------------------------------------------- [place] Details Patient Name: Adam Merritt Date of Service: 11/17/2019 10:45 AM Medical Record Number: [id number] Patient Account Number: [id number] Date of Birth/Sex: [birth year] (74 y.o. M) Treating RN: Montey Hora Primary Care Kingsly Kloepfer: [name] Other Clinician: Referring Antonisha Waskey: [name] Treating Murrell Elizondo/Extender: Melburn Hake, [name]: 7 Active Inactive Abuse / Safety / Falls / Self Care Management Nursing Diagnoses: Potential for falls Goals: Patient will remain injury free related to falls Date Initiated: 09/26/2019 Target Resolution Date: 12/13/2019 Goal Status: Active Interventions: Assess fall risk on admission and as needed Notes: Electronic Signature(s) Signed: 11/17/2019 12:43:42 PM By: Montey Hora Entered By: Montey Hora on 11/17/2019 11:06:24 Adam Merritt ([id number]) -------------------------------------------------------------------------------- Pain Assessment Details Patient Name: Adam Merritt Date of Service: 11/17/2019 10:45 AM Medical Record Number: [id number] Patient Account Number: [id number] Date of Birth/Sex: 1945-07-15 (74 y.o. M) Treating RN: Montey Hora Primary Care Simar Pothier: [name] Other Clinician: Referring Cherell Colvin: [name] Treating Raynell Upton/Extender: Melburn Hake, [name]: 7 Active Problems Location of Pain Severity and Description of Pain Patient Has Paino No Site Locations Pain Management and Medication Current Pain Management: Electronic Signature(s) Signed: 11/17/2019 12:43:42 PM By: Montey Hora Entered By: Montey Hora on  11/17/2019 10:56:07 Adam Merritt ([id number]) -------------------------------------------------------------------------------- Patient/Caregiver Education Details Patient Name: Adam Merritt Date of Service: 11/17/2019 10:45 AM Medical Record Number: [id number] Patient Account Number: [id number] Date of Birth/Gender: [birth year] (74 y.o. M) Treating RN: Montey Hora Primary Care Physician: [name] Other Clinician: Referring Physician: [name] Treating Physician/Extender: Sharalyn Ink in Treatment: 7 Education Assessment Education Provided To: Patient and Caregiver Education Topics Provided Venous: Handouts: Other: edema management Methods: Explain/Verbal Responses: State content correctly Electronic Signature(s) Signed: 11/17/2019 12:43:42 PM By: Montey Hora Entered By: Montey Hora on 11/17/2019 11:08:37 Adam Merritt ([id number]) -------------------------------------------------------------------------------- Wound Assessment Details Patient Name: Adam Merritt Date of Service: 11/17/2019 10:45 AM Medical Record Number: [id number] Patient Account Number: [id number] Date of Birth/Sex: 1945-11-08 (74 y.o. M) Treating RN: Montey Hora Primary Care Shawana Knoch: [name] Other Clinician: Referring Candyce Gambino: [name] Treating Siona Coulston/Extender: STONE III, [name]: 7 Wound Status Wound Number: 35 Primary Skin Tear Etiology: Wound Location: Left Lower Leg Wound Open Wounding Event: Trauma Status: Date Acquired: 11/07/2019 Comorbid Anemia, Lymphedema, Chronic  Obstructive Pulmonary Weeks Of Treatment: 1 History: Disease (COPD), Sleep Apnea, Congestive Heart Failure, Clustered Wound: No Coronary Artery Disease, Hypertension, Peripheral Venous Disease, Type II Diabetes, Osteoarthritis, Neuropathy, Received Chemotherapy Photos Wound Measurements Length: (cm) 2.6 Width: (cm) 1.7 Depth: (cm) 0.1 Area: (cm) 3.471 Volume: (cm)  0.347 % Reduction in Area: 35.2% % Reduction in Volume: 35.3% Epithelialization: Small (1-33%) Tunneling: No Undermining: No Wound Description Classification: Full Thickness Without Exposed Support Structu Wound Margin: Flat and Intact Exudate Amount: Medium Exudate Type: Serous Exudate Color: amber res Foul Odor After Cleansing: No Slough/Fibrino No Wound Bed Granulation Amount: Large (67-100%) Exposed Structure Granulation Quality: Pink Fascia Exposed: No Necrotic Amount: None Present (0%) Fat Layer (Subcutaneous Tissue) Exposed: Yes Tendon Exposed: No Muscle Exposed: No Joint Exposed: No Bone Exposed: No Treatment Notes Wound #35 (Left Lower Leg) Notes Adam Merritt, Adam Merritt, Adam Merritt ([id number]) Electronic Signature(s) Signed: 11/17/2019 12:43:42 PM By: Montey Hora Entered By: Montey Hora on 11/17/2019 11:03:58 Adam Merritt ([id number]) -------------------------------------------------------------------------------- Vitals Details Patient Name: Adam Merritt Date of Service: 11/17/2019 10:45 AM Medical Record Number: [id number] Patient Account Number: [id number] Date of Birth/Sex: 30-Jul-1945 (74 y.o. M) Treating RN: Montey Hora Primary Care Taysom Glymph: [name] Other Clinician: Referring Duchess Armendarez: [name] Treating Tiffony Kite/Extender: Melburn Hake, [name]: 7 Vital Signs Time Taken: 10:55 Temperature (F): 98.7 Pulse (bpm): 68 Respiratory Rate (breaths/min): 20 Blood Pressure (mmHg): 171/70 Reference Range: 80 - 120 mg / dl Electronic Signature(s) Signed: 11/17/2019 12:43:42 PM By: Montey Hora Entered By: Montey Hora on 11/17/2019 10:55:10

## 2019-11-24 ENCOUNTER — Other Ambulatory Visit: Payer: Self-pay

## 2019-11-24 ENCOUNTER — Ambulatory Visit: Payer: Medicare PPO | Admitting: Physician Assistant

## 2019-11-24 ENCOUNTER — Encounter: Payer: Medicare PPO | Admitting: Physician Assistant

## 2019-11-24 DIAGNOSIS — I110 Hypertensive heart disease with heart failure: Secondary | ICD-10-CM | POA: Diagnosis not present

## 2019-11-24 DIAGNOSIS — G4730 Sleep apnea, unspecified: Secondary | ICD-10-CM | POA: Diagnosis not present

## 2019-11-24 DIAGNOSIS — E11622 Type 2 diabetes mellitus with other skin ulcer: Secondary | ICD-10-CM | POA: Diagnosis not present

## 2019-11-24 DIAGNOSIS — I2510 Atherosclerotic heart disease of native coronary artery without angina pectoris: Secondary | ICD-10-CM | POA: Diagnosis not present

## 2019-11-24 DIAGNOSIS — E669 Obesity, unspecified: Secondary | ICD-10-CM | POA: Diagnosis not present

## 2019-11-24 DIAGNOSIS — I890 Lymphedema, not elsewhere classified: Secondary | ICD-10-CM | POA: Diagnosis not present

## 2019-11-24 DIAGNOSIS — L97222 Non-pressure chronic ulcer of left calf with fat layer exposed: Secondary | ICD-10-CM | POA: Diagnosis not present

## 2019-11-24 DIAGNOSIS — E1151 Type 2 diabetes mellitus with diabetic peripheral angiopathy without gangrene: Secondary | ICD-10-CM | POA: Diagnosis not present

## 2019-11-24 DIAGNOSIS — I509 Heart failure, unspecified: Secondary | ICD-10-CM | POA: Diagnosis not present

## 2019-11-24 DIAGNOSIS — L97812 Non-pressure chronic ulcer of other part of right lower leg with fat layer exposed: Secondary | ICD-10-CM | POA: Diagnosis not present

## 2019-11-24 NOTE — Progress Notes (Addendum)
CHENEY, EWART ([id number]) Visit Report for 11/24/2019 Chief Complaint Document Details Patient Name: Adam Merritt, Adam Merritt Date of Service: 11/24/2019 2:30 PM Medical Record Number: [id number] Patient Account Number: [id number] Date of Birth/Sex: Sep 21, 1945 (74 y.o. M) Treating RN: Cornell Barman Primary Care Provider: [name] Other Clinician: Referring Provider: [name] Treating Provider/Extender: Melburn Hake, Anureet Bruington [name]: 8 Information Obtained from: Patient Chief Complaint Bilateral LE Ulcers and right second toe ulcer 09/26/2019; patient is here for review bilateral lower leg wounds. He is well familiar to this clinic Electronic Signature(s) Signed: 11/24/2019 2:24:00 PM By: Worthy Keeler PA-C Entered By: Worthy Keeler on 11/24/2019 14:23:59 Adam Merritt ([id number]) -------------------------------------------------------------------------------- HPI Details Patient Name: Adam Merritt Date of Service: 11/24/2019 2:30 PM Medical Record Number: [id number] Patient Account Number: [id number] Date of Birth/Sex: 1946/05/09 (74 y.o. M) Treating RN: Cornell Barman Primary Care Provider: [name] Other Clinician: Referring Provider: [name] Treating Provider/Extender: Melburn Hake, Jaden Batchelder [name]: 8 History of Present Illness HPI Description: 10/18/17-He is here for initial evaluation of bilateral lower extremity ulcerations in the presence of venous insufficiency and lymphedema. He has been seen by vascular medicine in the past, Dr. Lucky Cowboy, last seen in 2016. He does have a history of abnormal ABIs, which is to be expected given his lymphedema and venous insufficiency. According to Epic, it appears that all attempts for arterial evaluation and/or angiography were not follow through with by patient. He does have a history of being seen in lymphedema clinic in 2018, stopped going approximately 6 months ago stating "it didn't do any good". He does not have lymphedema  pumps, he does not have custom fit compression wrap/stockings. He is diabetic and his recent A1c last month was 7.6. He admits to chronic bilateral lower extremity pain, no change in pain since blister and ulceration development. He is currently being treated with Levaquin for bronchitis. He has home health and we will continue. 10/25/17-He is here in follow-up evaluation for bilateral lower extremity ulcerationssubtle he remains on Levaquin for bronchitis. Right lower extremity with no evidence of drainage or ulceration, persistent left lower extremity ulceration. He states that home health has not been out since his appointment. He went to [facility] on Tuesday, studies revealed: RIGHT ABI 0.9, TBI 0.6 LEFT ABI 1.1, TBI 0.6 with triphasic flow bilaterally. We will continue his same treatment plan. He has been educated on compression therapy and need for elevation. He will benefit from lymphedema pumps 11/01/17-He is here in follow-up evaluation for left lower extremity ulcer. The right lower extremity remains healed. He has home health services, but they have not been out to see the patient for 2-3 weeks. He states it home health physical therapy changed his dressing yesterday after therapy; he placed Ace wrap compression. We are still waiting for lymphedema pumps, reordered d/t need for company change. 11/08/17-He is here in follow-up evaluation for left lower extremity ulcer. It is improved. Edema is significantly improved with compression therapy. We will continue with same treatment plan and he will follow-up next week. No word regarding lymphedema pumps 11/15/17-He is here in follow-up evaluation for left lower extremity ulcer. He is healed and will be discharged from wound care services. I have reached out to [facility] regarding his lymphedema pumps. They have been unable to reach the patient; the contact number they had with the patient's wife's cell phone and she has not  answered any unrecognized calls. Contact should be made today, trial planned  for next week; [facility] will continue to follow 11/27/17 on evaluation today patient has multiple blistered areas over the right lower extremity his left lower extremity appears to be doing okay. These blistered areas show signs of no infection which is great news. With that being said he did have some necrotic skin overlying which was mechanically debrided away with saline and gauze today without complication. Overall post debridement the wounds appear to be doing better but in general his swelling seems to be increased. This is obviously not good news. I think this is what has given rise to the blisters. 12/04/17 on evaluation today patient presents for follow-up concerning his bilateral lower extremity edema in the right lower extremity ulcers. He has been tolerating the dressing changes without complication. With that being said he has had no real issues with the wraps which is also good news. Overall I'm pleased with the progress he's been making. 12/11/17 on evaluation today patient appears to be doing rather well in regard to his right lateral lower extremity ulcer. He's been tolerating the dressing changes without complication. Fortunately there does not appear to be any evidence of infection at this time. Overall I'm pleased with the progress that is being made. Unfortunately he has been in the hospital due to having what sounds to be a stomach virus/flu fortunately that is starting to get better. 12/18/17 on evaluation today patient actually appears to be doing very well in regard to his bilateral lower extremities the swelling is under fairly good control his lymphedema pumps are still not up and running quite yet. With that being said he does have several areas of opening noted as far as wounds are concerned mainly over the left lower extremity. With that being said I do believe once he gets lymphedema pumps  this would least help you mention some the fluid and preventing this from occurring. Hopefully that will be set up soon sleeves are Artie in place at his home he just waiting for the machine. 12/25/17 on evaluation today patient actually appears to be doing excellent in fact all of his ulcers appear to have resolved his legs appear very well. I do think he needs compression stockings we have discussed this and they are actually going to go to [place] today to elastic therapy to get this fitted for him. I think that is definitely a good thing to do. Readmission: 04/09/18 upon evaluation today this patient is seen for readmission due to bilateral lower extremity lymphedema. He has significant swelling of his extremities especially on the left although the right is also swollen he has weeping from both sides. There are no obvious open wounds at this point. Fortunately he has been doing fairly well for quite a bit of time since I last saw him. Nonetheless unfortunately this seems to have reopened and is giving quite a bit of trouble. He states this began about a week ago when he first called [place] to get in to be seen. No fevers, chills, nausea, or vomiting noted at this time. He has not been using his lymphedema pumps due to the fact that they won't fit on his leg at this point likewise is also not been using his compression for essentially the same reason. 04/16/18 upon evaluation today patient actually appears to be doing a little better in regard to the fluid in his bilateral lower extremities. With that being said he's had three falls since I saw him last week. He also states that he's been feeling  very poorly. I was concerned last week and feel like that the concern is still there as far as the congestion in his chest is concerned he seems to be breathing about the same as last week but again he states he's very weak he's not even able to walk further than from the chair to the door. His wife had to  buy a wheelchair just to be able to get them out of the house to get to the appointment today. This has me very concerned. 04/23/18 on evaluation today patient actually appears to be doing much better than last week's evaluation. At that time actually had to transport him to the ER via EMS and he subsequently was admitted for acute pulmonary edema, acute renal failure, and acute congestive heart failure. Fortunately he is doing much better. Apparently they did dialyze him and were able to take off roughly 35 pounds of fluid. Nonetheless he is feeling much better both in regard to his breathing and he's able to get around much better at this time compared to previous. Overall I'm very Adam Merritt, Adam Merritt ([id number]) happy with how things are at this time. There does not appear to be any evidence of infection currently. No fevers, chills, nausea, or vomiting noted at this time. 04/30/2018 patient seen today for follow-up and management of bilateral lower extremity lymphedema. He did express being more sad today than usual due to the recent loss of his dog. He states that he has been compliant with using the lymphedema pumps. However he does admit a minute over the last 2-3 days he has not been using the pumps due to the recent loss of his dog. At this time there is no drainage or open wounds to his lower extremities. The left leg edema is measuring smaller today. Still has a significant amount of edema on bilateral lower extremities With dry flaky skin. He will be referred to the lymphedema clinic for further management. Will continue 3 layer compression wraps and follow-up in 1-2 weeks.Denies any pain, fever, chairs recently. No recent falls or injuries reported during this visit. 05/07/18 on evaluation today patient actually appears to be doing very well in regard to his lower extremities in general all things considered. With that being said he is having some pain in the legs just due to the amount of  swelling. He does have an area where he had a blister on the left lateral lower extremity this is open at this point other than that there's nothing else weeping at this time. 05/14/18 on evaluation today patient actually appears to be doing excellent all things considered in regard to his lower extremities. He still has a couple areas of weeping on each leg which has continued to be the issue for him. He does have an appointment with the lymphedema clinic although this isn't until February 2020. That was the earliest they had. In the meantime he has continued to tolerate the compression wraps without complication. 05/28/18 on evaluation today patient actually appears to be doing more poorly in regard to his left lower extremity where he has a wound open at this point. He also had a fall where he subsequently injured his right great toe which has led to an open wound at the site unfortunately. He has been tolerating the dressing changes without complication in general as far as the wraps are concerned that he has not been putting any dressing on the left 1st toe ulcer site. 06/11/18 on evaluation today patient appears to  be doing much worse in regard to his bilateral lower extremity ulcers. He has been tolerating the dressing changes without complication although his legs have not been wrapped more recently. Overall I am not very pleased with the way his legs appear. I do believe he needs to be back in compression wraps he still has not received his compression wraps from the [facility] hospital as of yet. 06/18/18 on evaluation today patient actually appears to be doing significantly better than last time I saw him. He has been tolerating the compression wraps without complication in the circumferential ulcers especially appear to be doing much better. His toe ulcer on the right in regard to the great toe is better although not as good as the legs in my pinion. No fevers chills noted 07/02/18 on evaluation  today patient appears to be doing much better in regard to his lower extremity ulcers. Unfortunately since I last saw him he's had the distal portion of his right great toe if he dated it sounds as if this actually went downhill very quickly. I had only seen him a few days prior and the toe did not appear to be infected at that point subsequently became infected very rapidly and it was decided by the surgeon that the distal portion of the toe needed to be removed. The patient seems to be doing well in this regard he tells me. With that being said his lower extremities are doing better from the standpoint of the wounds although he is significantly swollen at this point. 07/09/18 on evaluation today patient appears to be doing better in regard to the wounds on his lower extremities. In fact everything is almost completely healed he is just a small area on the left posterior lower extremity that is open at this point. 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 With that being said he needs some kind of compression it does sound like he may not be wearing his compression, that is the wraps, during the entire time between when he's here visit to visit. Apparently his wife took the current one off because it began to "fall apart". 07/16/18 on evaluation today patient appears to be extremely swollen especially in regard to his left lower extremity unfortunately. He also has a new skin tear over the left lower extremity and there's a smaller area on the right lower extremity as well. Unfortunately this seems to be due in part to blistering and fluid buildup in his leg. He did get the reduction wraps that were ordered by the [facility] hospital for him to go to lymphedema clinic. With that being said his wounds on the legs have not healed  to the point to where they would likely accept them as a patient lymphedema clinic currently. We need to try to get this to heal. With that being said he's been taking his wraps off which is not doing him any favors at this point. In fact this is probably quite counterproductive compared to what needs to occur. We will likely need to increase to a four layer compression wrap and continue to also utilize elevation and he has to keep the wraps on not take them off as he's been doing currently hasn't had a wrap on since Saturday. 07/23/18 on evaluation today patient appears to be doing much better in regard to  his bilateral lower extremities. In fact his left lower extremity which was the largest is actually 15 cm smaller today compared to what it was last time he was here in our clinic. This is obviously good news after just one week. Nonetheless the differences he actually kept the wraps on during the entire week this time. That's not typical for him. I do believe he understands a little bit better now the severity of the situation and why it's important for him to keep these wraps on. 07/30/18 on evaluation today patient actually appears to be doing rather well in regard to his lower extremities. His legs are much smaller than they have been in the past and he actually has only one very small rudder superficial region remaining that is not closed on the left lateral/posterior lower extremity even this is almost completely close. I do believe likely next week he will be healed without any complications. I do think we need to continue the wraps however this seems to be beneficial for him. I also think it may be a good time for [place] to go ahead and see about getting the appointment with the lymphedema clinic which is supposed to be made for him in order to keep this moving along and hopefully get them into compression wraps that will in the end help him to remain healed. 08/06/18 on evaluation today patient  actually appears to be doing very well in regard as bilateral lower extremities. In fact his wounds appear to be completely healed at this time. 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 He is in the process of getting appointment with the lymphedema clinic we have made this referral were just waiting to hear back on the schedule time. We need to follow up on that today as well. 08/13/18 on evaluation today patient actually appears to be doing very well in regard to his bilateral lower extremities there are no open wounds at this point. We are gonna go ahead and see about ordering the Velcro compression wraps for him had a discussion with them about [place] doing it versus the [place] they feel like they can definitely afford going ahead and get the wraps themselves and they would prefer to try to avoid having to go to the lymphedema clinic if it all possible which I completely understand. As long as he has good compression I'm okay either way. 3/17//20 on evaluation today patient actually appears to be doing well in regard to his bilateral lower extremity ulcers. He has been tolerating the dressing changes without complication specifically the compression wraps. Overall is had no issues my fingers finding that I see at this point is that he is having trouble with constipation. He tells me he has not been able to go to the bathroom for about six days. He's taken to over-the- counter oral laxatives unfortunately this is not helping. He has not contacted his doctor. AAMARI, Adam Merritt ([id number]) 08/27/18 on evaluation today patient appears to be doing fairly well in regard to his lower extremities at this point. There does not appear to be any new altars is swelling is very well controlled. We are still waiting for his Velcro compression wraps to arrive that should be sometime in the next week is Artie sent the check for these. 09/03/18 on evaluation today  patient appears to be doing excellent in regard to his bilateral lower extremities which he shows no signs of wound openings in regard to  this point. He does have his Velcro compression wraps which did arrive in the mail since I saw him last week. Overall he is doing excellent in my pinion. 09/13/18 on evaluation today patient appears to be doing very well currently in regard to the overall appearance of his bilateral lower extremities although he's a little bit more swollen than last time we saw him. At that point he been discharged without any open wounds. Nonetheless he has a small open wound on the posterior left lower extremity with some evidence of cellulitis noted as well. Fortunately I feel like he has made good progress overall with regard to his lower extremities from were things used to be. 09/20/18 on evaluation today patient actually appears to be doing much better. The erythematous lower extremity is improving wound itself which is still open appears to be doing much better as far as but appearance as well as pain is concerned overall very pleased in this regard. There's no signs of active infection at this time. 09/27/18 on evaluation today patient's wounds on the lower extremity actually appear to be doing fairly well at this time which is good news. There is no evidence of active infection currently and again is just as left lower extremity were there any wounds at this point anyway. I believe they may be completely healed but again I'm not 100% sure based on evaluation today. I think one more week of observation would likely be a good idea. 10/04/18 on evaluation today patient actually appears to be doing excellent in regard to the left lower extremity which actually appears to be completely healed as of today. Unfortunately he's been having issues with his right lower extremity have a new wound that has opened. Fortunately there's no evidence of active infection at this time which is  good news. 10/10/18 upon evaluation today patient actually appears to be doing a little bit worse with the new open area on his right posterior lower extremity. He's been tolerating the dressing changes without complication. Right now we been using his compression wraps although I think we may need to switch back to actually performing bilateral compression wraps are in the clinic. No fevers, chills, nausea, or vomiting noted at this time. 10/17/18 on evaluation today patient actually appears to be doing quite well in regard to his bilateral lower extremity ulcers. He has been tolerating the reps without complication although he would prefer not to rewrap his legs as of today. Fortunately there's no signs of active infection which is good news. No fevers, chills, nausea, or vomiting noted at this time. 10/24/18 on evaluation today patient appears to be doing very well in regard to his lower extremities. His right lower extremity is shown signs of healing and his left lower Trinity though not healed appears to be improving which is excellent news. Overall very pleased with how things seem to be progressing at this time. The patient is likewise happy to hear this. His Velcro compression wraps however have not been put on properly will gonna show his wife how to do that properly today. 10/31/18 on evaluation today patient appears to be doing more poorly in regard to his left lower extremity in particular although both lower extremities actually are showing some signs of being worse than my previous evaluation. Unfortunately I'm just not sure that his compression stockings even with the use of the compression/lymphedema pumps seem to be controlling this well. Upon further questioning he tells me that he also is not able to  lie flat in the bed due to his congestive heart failure and difficulty breathing. For that reason he sleeps in his recliner. To make matters worse his recliner also cannot even hold his legs  up so instead of even being somewhat elevated they pretty much hang down to the floor. This is the way he sleeps each night which is definitely counterproductive to everything else that were attempting to do from the standpoint of controlling his fluid. Nonetheless I think that he potentially could benefit from a hospital bed although this would be something that his primary care provider would likely have to order since anything that is order on our side has to be directly related to wound care and again the hospital bed is not necessarily a direct relation to although I think it does contribute to his overall wound status on his lower extremities. 11/07/18 on evaluation today patient appears to be doing better in regard to his bilateral lower extremity. He's been tolerating the dressing changes without complication. We did in the interim since I last saw him switch to just using extras orbiting new alginate and that seems to have done much better for him. I'm very pleased with the overall progress that is made. 11/14/18 on evaluation today patient appears to be redoing rather well in regard to his left lower extremity ulcers which are the only ones remaining at this point. Fortunately there's no signs of active infection at this time which is good news. Overall been very pleased with how things seem to be progressing currently. No fevers, chills, nausea, or vomiting noted at this time. 11/20/17 on evaluation today patient actually appears to be doing quite well in regard to his lower extremities on the left on the right he has several blisters that showed up although there's some question about whether or not he has had his broker compression wraps on like he was supposed to or not. Fortunately there's no signs of active infection at this time which is good news. Unfortunately though he is doing well from the standpoint of the left leg the right leg is not doing as well again this is when we did not wrap  last week. 11/28/18 on evaluation today patient appears to be doing well in regard to his bilateral lower extremities is left appears to be healed is right is not healed but is very close to being so. Overall very pleased with how things seem to be progressing. Patient is likewise happy that things are doing well. 12/09/18 on evaluation today patient actually appears to be completely healed which is excellent news. He actually seems to be doing well in regard to the swelling of the bilateral lower extremities which is also great news. Overall very pleased with how things seem to be progressing. Readmission: 03/18/2019 upon evaluation today patient presents for reevaluation concerning issues that he is having with his left lower extremity where he does have a wound noted upon inspection today. He also has a wound on the right second toe on the tip where it is apparently been rubbing in his shoe I did look at issues and you can actually see where this has been occurring as well. Fortunately there is no signs of infection with regard to the toe necessarily although it is very swollen compared to normal that does have me somewhat concerned about the possibility of further evaluation for infection/osteomyelitis. I would recommend an x-ray to start with. Otherwise he states that it is been 1-2 weeks that he has  had the draining in regard to left lower extremity he does not know how this happened he has been wearing his compression appropriately and I do see that as well today but nonetheless I do think that he needs to continue to be very cautious with regard to elevation as well. Adam Merritt, Adam Merritt ([id number]) 03/25/2019 on evaluation today patient appears to be doing much better in regard to his lower extremity at this time. That is on the left. He did culture positive for Pseudomonas but the good news is he seems to be doing much better the gentamicin we applied topically seems to have done a great job for  him. There is no signs of active infection at this time systemically and locally he is doing much better. No fevers, chills, nausea, vomiting, or diarrhea. In regard to his right toe this seems to be doing much better there is very little pressure noted at this point I did clean up some of the callus today around the edges of the wound as well as the surface of the wound but to be honest he is progressing quite nicely. 10/30; the area on the left anterior lower tibia area is healed over. His edema control is adequate even though his use of the compression pumps seems very intermittent. He has a juxta lite stocking on the right leg and a think there is 1 for the left leg at home. His wife is putting these on. He has an area on the plantar right second toe which is a hammertoe they are trying to offload this 04/08/2019 on evaluation today patient appears to be doing well in regard to his lower extremities which is good news. We are seeing him today for his toe ulcer which is giving him a little bit more trouble but again still seems to be doing better at this point which is good news. He is going require some sharp debridement in regard to the toe today however. 04/15/2019 on evaluation today patient actually appears to be doing quite well with regard to his toe ulcer. I am very pleased with how things are doing in that regard. With regard to his lower extremity edema in general he tells me he is not been using the lymphedema pumps regularly although he does not use them. I think that he needs to use them more regularly he does have a small blister on the left lower extremity this is not open at this point but obviously it something that can get worse if he does not keep the compression going and the pumps going as well. 04/22/2019 on evaluation today patient appears to be doing well with regard to his toe ulcer. He has been tolerating the dressing changes without complication. This is measuring slightly  smaller this week as compared to last week. Fortunately there is no signs of active infection at this time. 05/06/2019 on evaluation today patient actually appears to be doing excellent. In fact his toe appears to be completely healed which is great news. There is no signs of active infection at this time. Readmission: Patient presents today for follow-up after having been discharged at the beginning of December 2020. He states that in recent weeks things have reopened and has been having more trouble at this point. With that being said fortunately there is no signs of active infection at this time. No fever chills noted. Nonetheless he also does have an issue with one of his toes as well that seems to be somewhat this is  on the right foot second toe. 07/25/2019 upon evaluation today patient's lower extremities appear to be doing excellent bilaterally. I really feel like he is showing signs of great improvement and in fact I think he is close to healing which is great news. There is no signs of active infection at this time. No fevers, chills, nausea, vomiting, or diarrhea. 07/31/2019 upon evaluation today patient appears to be doing excellent and in fact I think may be completely healed in regard to his right lower extremity that were still to monitor this 1 more week before closing it out. The left lower extremity is measuring smaller although not completely closed seems to be doing excellent. 08/07/2019 upon evaluation today patient appears to be doing excellent in regard to his wounds. In fact the right lower extremity is completely healed there is no issues here. The left lower extremity has 1 very small area still remaining fortunately there is no signs of infection and overall I think he is very close to closure of the here as well. 08/14/19 upon evaluation today patient appears to be doing excellent in regard to his lower extremities. In fact he appears to be completely healed as of today.  Fortunately there's no signs of active infection at this time. No fevers, chills, nausea, or vomiting noted at this time. READMISSION 09/26/2019 This is a 74 year old man who is well-known to this clinic having just been discharged on 08/14/2019. He has known lymphedema and chronic venous insufficiency. He has Farrow wrap stockings and also external compression pumps. He does not use the external compression pumps however. He does however apparently fairly faithfully use the stockings. They have not been lotion in his legs. He has developed new wounds on the right anterior as well as the left medial left lateral and left posterior calf. He returns for our review of this Past medical history includes coronary artery disease, hypertension, congestive heart failure, COPD, venous insufficiency with lymphedema, type 2 diabetes. He apparently has compression pumps, Farrow wraps and at 1 point a hospital bed although I believe he sleeps in a recliner 10/03/2019 upon evaluation today patient appears to be doing better with regard to his wounds in general. He has been tolerating the dressing changes without complication. Fortunately there is no signs of active infection. No fevers, chills, nausea, vomiting, or diarrhea. I do believe the compression wraps are helping as they always have in the past. 10/10/2019 upon evaluation today patient appears to be doing excellent at this point. His right lower extremity is measuring much better and in fact is completely healed. His left lower extremity is also measuring better though not healed seems to be doing excellent at this point which is great news. Overall I am very pleased with how things appear currently. 10/27/2019 upon evaluation today patient appears to be doing quite well with regard to his wounds currently. He has been tolerating the dressing changes without complication. Fortunately there is no signs of active infection at this time. In fact he is almost  completely healed on the left and has just a very small area open and on the right he is completely healed. 11/04/2019 upon evaluation today patient appears to be doing quite well with regard to his wounds. In fact he appears to be quite possibly completely healed on the left lower extremity now as well the right lower extremity is still doing well. With that being said unfortunately though this may be healed I think it is a very fragile area and I  cannot even confirm there is not a very small opening still remaining. I really think he may benefit from 1 additional weeks wrapping before discontinuing. 11/10/2019 upon evaluation today patient appears to be doing well in regard to his original wounds in fact everything that we were treating last week is completely healed on the left. Unfortunately he has a new skin tear just above where the wrap slipped down due to a fall he sustained causing this injury. 11/17/2019 on evaluation today patient appears to be doing better with regard to his wound. He has been tolerating the dressing changes without Adam Merritt, Adam Merritt. ([id number]) complication. Fortunately there is no signs of active infection at this time. No fevers, chills, nausea, vomiting, or diarrhea. 11/24/2019 upon evaluation today patient appears to be doing well with regard to his wound. This is making good progress there does not appear to be signs of active infection but overall I do feel like he is headed in the correct direction. Overall he is tolerating the compression wrap as well. Electronic Signature(s) Signed: 11/24/2019 3:44:55 PM By: Worthy Keeler PA-C Entered By: Worthy Keeler on 11/24/2019 15:44:55 Adam Merritt ([id number]) -------------------------------------------------------------------------------- Physical Exam Details Patient Name: Adam Merritt Date of Service: 11/24/2019 2:30 PM Medical Record Number: [id number] Patient Account Number: [id number] Date of Birth/Sex:  [birth year] (74 y.o. M) Treating RN: Cornell Barman Primary Care Provider: [name] Other Clinician: Referring Provider: [name] Treating Provider/Extender: Melburn Hake, Rikki Trosper [name]: 8 Constitutional Obese and well-hydrated in no acute distress. Respiratory normal breathing without difficulty. Psychiatric this patient is able to make decisions and demonstrates good insight into disease process. Alert and Oriented x 3. pleasant and cooperative. Notes Patient's wound is showing signs of excellent epithelization there does not appear to be any significant infection and overall I think there is a lot of new skin growth which is also good news. With that being said I believe he does still need the compression wrap at this time no sharp debridement was necessary. Electronic Signature(s) Signed: 11/24/2019 3:45:19 PM By: Worthy Keeler PA-C Entered By: Worthy Keeler on 11/24/2019 15:45:18 Adam Merritt ([id number]) -------------------------------------------------------------------------------- Physician Orders Details Patient Name: Adam Merritt Date of Service: 11/24/2019 2:30 PM Medical Record Number: [id number] Patient Account Number: [id number] Date of Birth/Sex: 1945/10/11 (74 y.o. M) Treating RN: Cornell Barman Primary Care Provider: [name] Other Clinician: Referring Provider: [name] Treating Provider/Extender: Melburn Hake, Ilse Billman [name]: 8 Verbal / Phone Orders: No Diagnosis Coding ICD-10 Coding Code Description I89.0 Lymphedema, not elsewhere classified L97.222 Non-pressure chronic ulcer of left calf with fat layer exposed L97.812 Non-pressure chronic ulcer of other part of right lower leg with fat layer exposed Wound Cleansing Wound #35 Left Lower Leg o May shower with protection. - Do not get your wrap wet Primary Wound Dressing Wound #35 Left Lower Leg o Hydrafera Blue Ready Transfer Secondary Dressing Wound #35 Left Lower Leg o ABD  pad Dressing Change Frequency o Change dressing every week Follow-up Appointments o Return Appointment in 1 week. Edema Control o 4-Layer Compression System - Left Lower Extremity. o Elevate legs to the level of the heart and pump ankles as often as possible o Compression Pump: Use compression pump on left lower extremity for 60 minutes, twice daily. o Compression Pump: Use compression pump on right lower extremity for 60 minutes, twice daily. Electronic Signature(s) Signed: 11/24/2019 4:16:09 PM By: Worthy Keeler PA-C Signed: 11/24/2019 4:54:26 PM By: Gretta Cool, BSN, RN,  CWS, Kim RN, BSN Entered By: Gretta Cool, BSN, RN, CWS, Kim on 11/24/2019 14:50:51 Adam Merritt ([id number]) -------------------------------------------------------------------------------- Problem List Details Patient Name: Adam Merritt Date of Service: 11/24/2019 2:30 PM Medical Record Number: [id number] Patient Account Number: [id number] Date of Birth/Sex: 1945-08-30 (75 y.o. M) Treating RN: Cornell Barman Primary Care Provider: [name] Other Clinician: Referring Provider: [name] Treating Provider/Extender: Melburn Hake, Jhayden Demuro [name]: 8 Active Problems ICD-10 Encounter Code Description Active Date MDM Diagnosis I89.0 Lymphedema, not elsewhere classified 09/26/2019 No Yes L97.222 Non-pressure chronic ulcer of left calf with fat layer exposed 09/26/2019 No Yes L97.812 Non-pressure chronic ulcer of other part of right lower leg with fat layer 09/26/2019 No Yes exposed Inactive Problems Resolved Problems Electronic Signature(s) Signed: 11/24/2019 2:23:51 PM By: Worthy Keeler PA-C Entered By: Worthy Keeler on 11/24/2019 14:23:51 Adam Merritt ([id number]) -------------------------------------------------------------------------------- Progress Note Details Patient Name: Adam Merritt Date of Service: 11/24/2019 2:30 PM Medical Record Number: [id number] Patient Account Number:  [id number] Date of Birth/Sex: [birth year] (74 y.o. M) Treating RN: Cornell Barman Primary Care Provider: [name] Other Clinician: Referring Provider: [name] Treating Provider/Extender: Melburn Hake, Haidyn Chadderdon [name]: 8 Subjective Chief Complaint Information obtained from Patient Bilateral LE Ulcers and right second toe ulcer 09/26/2019; patient is here for review bilateral lower leg wounds. He is well familiar to this clinic History of Present Illness (HPI) 10/18/17-He is here for initial evaluation of bilateral lower extremity ulcerations in the presence of venous insufficiency and lymphedema. He has been seen by vascular medicine in the past, Dr. Lucky Cowboy, last seen in 2016. He does have a history of abnormal ABIs, which is to be expected given his lymphedema and venous insufficiency. According to Epic, it appears that all attempts for arterial evaluation and/or angiography were not follow through with by patient. He does have a history of being seen in lymphedema clinic in 2018, stopped going approximately 6 months ago stating "it didn't do any good". He does not have lymphedema pumps, he does not have custom fit compression wrap/stockings. He is diabetic and his recent A1c last month was 7.6. He admits to chronic bilateral lower extremity pain, no change in pain since blister and ulceration development. He is currently being treated with Levaquin for bronchitis. He has home health and we will continue. 10/25/17-He is here in follow-up evaluation for bilateral lower extremity ulcerationssubtle he remains on Levaquin for bronchitis. Right lower extremity with no evidence of drainage or ulceration, persistent left lower extremity ulceration. He states that home health has not been out since his appointment. He went to [facility] on Tuesday, studies revealed: RIGHT ABI 0.9, TBI 0.6 LEFT ABI 1.1, TBI 0.6 with triphasic flow bilaterally. We will continue his same treatment plan. He  has been educated on compression therapy and need for elevation. He will benefit from lymphedema pumps 11/01/17-He is here in follow-up evaluation for left lower extremity ulcer. The right lower extremity remains healed. He has home health services, but they have not been out to see the patient for 2-3 weeks. He states it home health physical therapy changed his dressing yesterday after therapy; he placed Ace wrap compression. We are still waiting for lymphedema pumps, reordered d/t need for company change. 11/08/17-He is here in follow-up evaluation for left lower extremity ulcer. It is improved. Edema is significantly improved with compression therapy. We will continue with same treatment plan and he will follow-up next week. No word regarding lymphedema pumps 11/15/17-He is  here in follow-up evaluation for left lower extremity ulcer. He is healed and will be discharged from wound care services. I have reached out to [facility] regarding his lymphedema pumps. They have been unable to reach the patient; the contact number they had with the patient's wife's cell phone and she has not answered any unrecognized calls. Contact should be made today, trial planned for next week; [facility] will continue to follow 11/27/17 on evaluation today patient has multiple blistered areas over the right lower extremity his left lower extremity appears to be doing okay. These blistered areas show signs of no infection which is great news. With that being said he did have some necrotic skin overlying which was mechanically debrided away with saline and gauze today without complication. Overall post debridement the wounds appear to be doing better but in general his swelling seems to be increased. This is obviously not good news. I think this is what has given rise to the blisters. 12/04/17 on evaluation today patient presents for follow-up concerning his bilateral lower extremity edema in the right lower  extremity ulcers. He has been tolerating the dressing changes without complication. With that being said he has had no real issues with the wraps which is also good news. Overall I'm pleased with the progress he's been making. 12/11/17 on evaluation today patient appears to be doing rather well in regard to his right lateral lower extremity ulcer. He's been tolerating the dressing changes without complication. Fortunately there does not appear to be any evidence of infection at this time. Overall I'm pleased with the progress that is being made. Unfortunately he has been in the hospital due to having what sounds to be a stomach virus/flu fortunately that is starting to get better. 12/18/17 on evaluation today patient actually appears to be doing very well in regard to his bilateral lower extremities the swelling is under fairly good control his lymphedema pumps are still not up and running quite yet. With that being said he does have several areas of opening noted as far as wounds are concerned mainly over the left lower extremity. With that being said I do believe once he gets lymphedema pumps this would least help you mention some the fluid and preventing this from occurring. Hopefully that will be set up soon sleeves are Artie in place at his home he just waiting for the machine. 12/25/17 on evaluation today patient actually appears to be doing excellent in fact all of his ulcers appear to have resolved his legs appear very well. I do think he needs compression stockings we have discussed this and they are actually going to go to [facility] today to elastic therapy to get this fitted for him. I think that is definitely a good thing to do. Readmission: 04/09/18 upon evaluation today this patient is seen for readmission due to bilateral lower extremity lymphedema. He has significant swelling of his extremities especially on the left although the right is also swollen he has weeping from both sides. There  are no obvious open wounds at this point. Fortunately he has been doing fairly well for quite a bit of time since I last saw him. Nonetheless unfortunately this seems to have reopened and is giving quite a bit of trouble. He states this began about a week ago when he first called [place] to get in to be seen. No fevers, chills, nausea, or vomiting noted at this time. He has not been using his lymphedema pumps due to the fact that  they won't fit on his leg at this point likewise is also not been using his compression for essentially the same reason. 04/16/18 upon evaluation today patient actually appears to be doing a little better in regard to the fluid in his bilateral lower extremities. With that being said he's had three falls since I saw him last week. He also states that he's been feeling very poorly. I was concerned last week and feel like that the concern is still there as far as the congestion in his chest is concerned he seems to be breathing about the same as last week but again he states he's very weak he's not even able to walk further than from the chair to the door. His wife had to buy a wheelchair just to be able to get them out of the house to get to the appointment today. This has me very concerned. Adam Merritt, Adam Merritt ([id number]) 04/23/18 on evaluation today patient actually appears to be doing much better than last week's evaluation. At that time actually had to transport him to the ER via EMS and he subsequently was admitted for acute pulmonary edema, acute renal failure, and acute congestive heart failure. Fortunately he is doing much better. Apparently they did dialyze him and were able to take off roughly 35 pounds of fluid. Nonetheless he is feeling much better both in regard to his breathing and he's able to get around much better at this time compared to previous. Overall I'm very happy with how things are at this time. There does not appear to be any evidence of infection currently.  No fevers, chills, nausea, or vomiting noted at this time. 04/30/2018 patient seen today for follow-up and management of bilateral lower extremity lymphedema. He did express being more sad today than usual due to the recent loss of his dog. He states that he has been compliant with using the lymphedema pumps. However he does admit a minute over the last 2-3 days he has not been using the pumps due to the recent loss of his dog. At this time there is no drainage or open wounds to his lower extremities. The left leg edema is measuring smaller today. Still has a significant amount of edema on bilateral lower extremities With dry flaky skin. He will be referred to the lymphedema clinic for further management. Will continue 3 layer compression wraps and follow-up in 1-2 weeks.Denies any pain, fever, chairs recently. No recent falls or injuries reported during this visit. 05/07/18 on evaluation today patient actually appears to be doing very well in regard to his lower extremities in general all things considered. With that being said he is having some pain in the legs just due to the amount of swelling. He does have an area where he had a blister on the left lateral lower extremity this is open at this point other than that there's nothing else weeping at this time. 05/14/18 on evaluation today patient actually appears to be doing excellent all things considered in regard to his lower extremities. He still has a couple areas of weeping on each leg which has continued to be the issue for him. He does have an appointment with the lymphedema clinic although this isn't until February 2020. That was the earliest they had. In the meantime he has continued to tolerate the compression wraps without complication. 05/28/18 on evaluation today patient actually appears to be doing more poorly in regard to his left lower extremity where he has a wound open at this  point. He also had a fall where he subsequently injured  his right great toe which has led to an open wound at the site unfortunately. He has been tolerating the dressing changes without complication in general as far as the wraps are concerned that he has not been putting any dressing on the left 1st toe ulcer site. 06/11/18 on evaluation today patient appears to be doing much worse in regard to his bilateral lower extremity ulcers. He has been tolerating the dressing changes without complication although his legs have not been wrapped more recently. Overall I am not very pleased with the way his legs appear. I do believe he needs to be back in compression wraps he still has not received his compression wraps from the [facility] hospital as of yet. 06/18/18 on evaluation today patient actually appears to be doing significantly better than last time I saw him. He has been tolerating the compression wraps without complication in the circumferential ulcers especially appear to be doing much better. His toe ulcer on the right in regard to the great toe is better although not as good as the legs in my pinion. No fevers chills noted 07/02/18 on evaluation today patient appears to be doing much better in regard to his lower extremity ulcers. Unfortunately since I last saw him he's had the distal portion of his right great toe if he dated it sounds as if this actually went downhill very quickly. I had only seen him a few days prior and the toe did not appear to be infected at that point subsequently became infected very rapidly and it was decided by the surgeon that the distal portion of the toe needed to be removed. The patient seems to be doing well in this regard he tells me. With that being said his lower extremities are doing better from the standpoint of the wounds although he is significantly swollen at this point. 07/09/18 on evaluation today patient appears to be doing better in regard to the wounds on his lower extremities. In fact everything is almost completely  healed he is just a small area on the left posterior lower extremity that is open at this point. 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 With that being said he needs some kind of compression it does sound like he may not be wearing his compression, that is the wraps, during the entire time between when he's here visit to visit. Apparently his wife took the current one off because it began to "fall apart". 07/16/18 on evaluation today patient appears to be extremely swollen especially in regard to his left lower extremity unfortunately. He also has a new skin tear over the left lower extremity and there's a smaller area on the right lower extremity as well. Unfortunately this seems to be due in part to blistering and fluid buildup in his leg. He did get the reduction wraps that were ordered by the [facility] hospital for him to go to lymphedema clinic. With that being said his wounds on the legs have not healed to the point to where they would likely accept them as a patient lymphedema clinic currently. We need to try to get this to heal. With that being said he's been taking his wraps off which is not doing him any favors at this point. In fact this is  probably quite counterproductive compared to what needs to occur. We will likely need to increase to a four layer compression wrap and continue to also utilize elevation and he has to keep the wraps on not take them off as he's been doing currently hasn't had a wrap on since Saturday. 07/23/18 on evaluation today patient appears to be doing much better in regard to his bilateral lower extremities. In fact his left lower extremity which was the largest is actually 15 cm smaller today compared to what it was last time he was here in our clinic. This is obviously good news after  just one week. Nonetheless the differences he actually kept the wraps on during the entire week this time. That's not typical for him. I do believe he understands a little bit better now the severity of the situation and why it's important for him to keep these wraps on. 07/30/18 on evaluation today patient actually appears to be doing rather well in regard to his lower extremities. His legs are much smaller than they have been in the past and he actually has only one very small rudder superficial region remaining that is not closed on the left lateral/posterior lower extremity even this is almost completely close. I do believe likely next week he will be healed without any complications. I do think we need to continue the wraps however this seems to be beneficial for him. I also think it may be a good time for [place] to go ahead and see about getting the appointment with the lymphedema clinic which is supposed to be made for him in order to keep this moving along and hopefully get them into compression wraps that will in the end help him to remain healed. 08/06/18 on evaluation today patient actually appears to be doing very well in regard as bilateral lower extremities. In fact his wounds appear to be completely healed at this time. 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 He is in the process of getting appointment with the lymphedema clinic we have made this referral were just waiting to hear back on the schedule time. We need to follow up on that today as well. 08/13/18 on evaluation today patient actually appears to be doing very well in regard to his bilateral lower extremities there are no open wounds at this point. We are gonna go ahead and see about ordering the Velcro compression wraps for him had a discussion with them about [place] doing it versus the [place] they feel like they can definitely afford going ahead and get the wraps themselves and  they would prefer to try to avoid having to go to the lymphedema clinic if it all possible which I completely understand. As long as he has good compression I'm okay either way. Adam Merritt, Adam Merritt ([id number]) 3/17//20 on evaluation today patient actually appears to be doing well in regard to his bilateral lower extremity ulcers. He has been tolerating the dressing changes without complication specifically the compression wraps. Overall is had no issues my fingers finding that I see at this point is that he is having trouble with constipation. He tells me he has not been able to go to the bathroom for about six days. He's taken to over-the- counter oral laxatives unfortunately this is not helping. He has not contacted his doctor. 08/27/18 on evaluation today patient appears to be doing fairly well in regard to his lower extremities at this point. There does not appear  to be any new altars is swelling is very well controlled. We are still waiting for his Velcro compression wraps to arrive that should be sometime in the next week is Artie sent the check for these. 09/03/18 on evaluation today patient appears to be doing excellent in regard to his bilateral lower extremities which he shows no signs of wound openings in regard to this point. He does have his Velcro compression wraps which did arrive in the mail since I saw him last week. Overall he is doing excellent in my pinion. 09/13/18 on evaluation today patient appears to be doing very well currently in regard to the overall appearance of his bilateral lower extremities although he's a little bit more swollen than last time we saw him. At that point he been discharged without any open wounds. Nonetheless he has a small open wound on the posterior left lower extremity with some evidence of cellulitis noted as well. Fortunately I feel like he has made good progress overall with regard to his lower extremities from were things used to be. 09/20/18 on  evaluation today patient actually appears to be doing much better. The erythematous lower extremity is improving wound itself which is still open appears to be doing much better as far as but appearance as well as pain is concerned overall very pleased in this regard. There's no signs of active infection at this time. 09/27/18 on evaluation today patient's wounds on the lower extremity actually appear to be doing fairly well at this time which is good news. There is no evidence of active infection currently and again is just as left lower extremity were there any wounds at this point anyway. I believe they may be completely healed but again I'm not 100% sure based on evaluation today. I think one more week of observation would likely be a good idea. 10/04/18 on evaluation today patient actually appears to be doing excellent in regard to the left lower extremity which actually appears to be completely healed as of today. Unfortunately he's been having issues with his right lower extremity have a new wound that has opened. Fortunately there's no evidence of active infection at this time which is good news. 10/10/18 upon evaluation today patient actually appears to be doing a little bit worse with the new open area on his right posterior lower extremity. He's been tolerating the dressing changes without complication. Right now we been using his compression wraps although I think we may need to switch back to actually performing bilateral compression wraps are in the clinic. No fevers, chills, nausea, or vomiting noted at this time. 10/17/18 on evaluation today patient actually appears to be doing quite well in regard to his bilateral lower extremity ulcers. He has been tolerating the reps without complication although he would prefer not to rewrap his legs as of today. Fortunately there's no signs of active infection which is good news. No fevers, chills, nausea, or vomiting noted at this time. 10/24/18 on  evaluation today patient appears to be doing very well in regard to his lower extremities. His right lower extremity is shown signs of healing and his left lower Trinity though not healed appears to be improving which is excellent news. Overall very pleased with how things seem to be progressing at this time. The patient is likewise happy to hear this. His Velcro compression wraps however have not been put on properly will gonna show his wife how to do that properly today. 10/31/18 on evaluation today patient appears  to be doing more poorly in regard to his left lower extremity in particular although both lower extremities actually are showing some signs of being worse than my previous evaluation. Unfortunately I'm just not sure that his compression stockings even with the use of the compression/lymphedema pumps seem to be controlling this well. Upon further questioning he tells me that he also is not able to lie flat in the bed due to his congestive heart failure and difficulty breathing. For that reason he sleeps in his recliner. To make matters worse his recliner also cannot even hold his legs up so instead of even being somewhat elevated they pretty much hang down to the floor. This is the way he sleeps each night which is definitely counterproductive to everything else that were attempting to do from the standpoint of controlling his fluid. Nonetheless I think that he potentially could benefit from a hospital bed although this would be something that his primary care provider would likely have to order since anything that is order on our side has to be directly related to wound care and again the hospital bed is not necessarily a direct relation to although I think it does contribute to his overall wound status on his lower extremities. 11/07/18 on evaluation today patient appears to be doing better in regard to his bilateral lower extremity. He's been tolerating the dressing changes without  complication. We did in the interim since I last saw him switch to just using extras orbiting new alginate and that seems to have done much better for him. I'm very pleased with the overall progress that is made. 11/14/18 on evaluation today patient appears to be redoing rather well in regard to his left lower extremity ulcers which are the only ones remaining at this point. Fortunately there's no signs of active infection at this time which is good news. Overall been very pleased with how things seem to be progressing currently. No fevers, chills, nausea, or vomiting noted at this time. 11/20/17 on evaluation today patient actually appears to be doing quite well in regard to his lower extremities on the left on the right he has several blisters that showed up although there's some question about whether or not he has had his broker compression wraps on like he was supposed to or not. Fortunately there's no signs of active infection at this time which is good news. Unfortunately though he is doing well from the standpoint of the left leg the right leg is not doing as well again this is when we did not wrap last week. 11/28/18 on evaluation today patient appears to be doing well in regard to his bilateral lower extremities is left appears to be healed is right is not healed but is very close to being so. Overall very pleased with how things seem to be progressing. Patient is likewise happy that things are doing well. 12/09/18 on evaluation today patient actually appears to be completely healed which is excellent news. He actually seems to be doing well in regard to the swelling of the bilateral lower extremities which is also great news. Overall very pleased with how things seem to be progressing. Readmission: 03/18/2019 upon evaluation today patient presents for reevaluation concerning issues that he is having with his left lower extremity where he does have a wound noted upon inspection today. He also  has a wound on the right second toe on the tip where it is apparently been rubbing in his shoe I did look at issues and  you can actually see where this has been occurring as well. Fortunately there is no signs of infection with regard to Adam Merritt, Adam Merritt. ([id number]) the toe necessarily although it is very swollen compared to normal that does have me somewhat concerned about the possibility of further evaluation for infection/osteomyelitis. I would recommend an x-ray to start with. Otherwise he states that it is been 1-2 weeks that he has had the draining in regard to left lower extremity he does not know how this happened he has been wearing his compression appropriately and I do see that as well today but nonetheless I do think that he needs to continue to be very cautious with regard to elevation as well. 03/25/2019 on evaluation today patient appears to be doing much better in regard to his lower extremity at this time. That is on the left. He did culture positive for Pseudomonas but the good news is he seems to be doing much better the gentamicin we applied topically seems to have done a great job for him. There is no signs of active infection at this time systemically and locally he is doing much better. No fevers, chills, nausea, vomiting, or diarrhea. In regard to his right toe this seems to be doing much better there is very little pressure noted at this point I did clean up some of the callus today around the edges of the wound as well as the surface of the wound but to be honest he is progressing quite nicely. 10/30; the area on the left anterior lower tibia area is healed over. His edema control is adequate even though his use of the compression pumps seems very intermittent. He has a juxta lite stocking on the right leg and a think there is 1 for the left leg at home. His wife is putting these on. He has an area on the plantar right second toe which is a hammertoe they are trying to offload  this 04/08/2019 on evaluation today patient appears to be doing well in regard to his lower extremities which is good news. We are seeing him today for his toe ulcer which is giving him a little bit more trouble but again still seems to be doing better at this point which is good news. He is going require some sharp debridement in regard to the toe today however. 04/15/2019 on evaluation today patient actually appears to be doing quite well with regard to his toe ulcer. I am very pleased with how things are doing in that regard. With regard to his lower extremity edema in general he tells me he is not been using the lymphedema pumps regularly although he does not use them. I think that he needs to use them more regularly he does have a small blister on the left lower extremity this is not open at this point but obviously it something that can get worse if he does not keep the compression going and the pumps going as well. 04/22/2019 on evaluation today patient appears to be doing well with regard to his toe ulcer. He has been tolerating the dressing changes without complication. This is measuring slightly smaller this week as compared to last week. Fortunately there is no signs of active infection at this time. 05/06/2019 on evaluation today patient actually appears to be doing excellent. In fact his toe appears to be completely healed which is great news. There is no signs of active infection at this time. Readmission: Patient presents today for follow-up after having been  discharged at the beginning of December 2020. He states that in recent weeks things have reopened and has been having more trouble at this point. With that being said fortunately there is no signs of active infection at this time. No fever chills noted. Nonetheless he also does have an issue with one of his toes as well that seems to be somewhat this is on the right foot second toe. 07/25/2019 upon evaluation today patient's lower  extremities appear to be doing excellent bilaterally. I really feel like he is showing signs of great improvement and in fact I think he is close to healing which is great news. There is no signs of active infection at this time. No fevers, chills, nausea, vomiting, or diarrhea. 07/31/2019 upon evaluation today patient appears to be doing excellent and in fact I think may be completely healed in regard to his right lower extremity that were still to monitor this 1 more week before closing it out. The left lower extremity is measuring smaller although not completely closed seems to be doing excellent. 08/07/2019 upon evaluation today patient appears to be doing excellent in regard to his wounds. In fact the right lower extremity is completely healed there is no issues here. The left lower extremity has 1 very small area still remaining fortunately there is no signs of infection and overall I think he is very close to closure of the here as well. 08/14/19 upon evaluation today patient appears to be doing excellent in regard to his lower extremities. In fact he appears to be completely healed as of today. Fortunately there's no signs of active infection at this time. No fevers, chills, nausea, or vomiting noted at this time. READMISSION 09/26/2019 This is a 74 year old man who is well-known to this clinic having just been discharged on 08/14/2019. He has known lymphedema and chronic venous insufficiency. He has Farrow wrap stockings and also external compression pumps. He does not use the external compression pumps however. He does however apparently fairly faithfully use the stockings. They have not been lotion in his legs. He has developed new wounds on the right anterior as well as the left medial left lateral and left posterior calf. He returns for our review of this Past medical history includes coronary artery disease, hypertension, congestive heart failure, COPD, venous insufficiency with lymphedema,  type 2 diabetes. He apparently has compression pumps, Farrow wraps and at 1 point a hospital bed although I believe he sleeps in a recliner 10/03/2019 upon evaluation today patient appears to be doing better with regard to his wounds in general. He has been tolerating the dressing changes without complication. Fortunately there is no signs of active infection. No fevers, chills, nausea, vomiting, or diarrhea. I do believe the compression wraps are helping as they always have in the past. 10/10/2019 upon evaluation today patient appears to be doing excellent at this point. His right lower extremity is measuring much better and in fact is completely healed. His left lower extremity is also measuring better though not healed seems to be doing excellent at this point which is great news. Overall I am very pleased with how things appear currently. 10/27/2019 upon evaluation today patient appears to be doing quite well with regard to his wounds currently. He has been tolerating the dressing changes without complication. Fortunately there is no signs of active infection at this time. In fact he is almost completely healed on the left and has just a very small area open and on the right he  is completely healed. 11/04/2019 upon evaluation today patient appears to be doing quite well with regard to his wounds. In fact he appears to be quite possibly completely healed on the left lower extremity now as well the right lower extremity is still doing well. With that being said unfortunately though this may be healed I think it is a very fragile area and I cannot even confirm there is not a very small opening still remaining. I really think he may benefit from 1 additional weeks wrapping before discontinuing. Adam Merritt, Adam Merritt ([id number]) 11/10/2019 upon evaluation today patient appears to be doing well in regard to his original wounds in fact everything that we were treating last week is completely healed on the left.  Unfortunately he has a new skin tear just above where the wrap slipped down due to a fall he sustained causing this injury. 11/17/2019 on evaluation today patient appears to be doing better with regard to his wound. He has been tolerating the dressing changes without complication. Fortunately there is no signs of active infection at this time. No fevers, chills, nausea, vomiting, or diarrhea. 11/24/2019 upon evaluation today patient appears to be doing well with regard to his wound. This is making good progress there does not appear to be signs of active infection but overall I do feel like he is headed in the correct direction. Overall he is tolerating the compression wrap as well. Objective Constitutional Obese and well-hydrated in no acute distress. Vitals Time Taken: 2:14 PM, Temperature: 98.2 F, Pulse: 77 bpm, Respiratory Rate: 22 breaths/min, Blood Pressure: 153/79 mmHg. Respiratory normal breathing without difficulty. Psychiatric this patient is able to make decisions and demonstrates good insight into disease process. Alert and Oriented x 3. pleasant and cooperative. General Notes: Patient's wound is showing signs of excellent epithelization there does not appear to be any significant infection and overall I think there is a lot of new skin growth which is also good news. With that being said I believe he does still need the compression wrap at this time no sharp debridement was necessary. Integumentary (Hair, Skin) Wound #35 status is Open. Original cause of wound was Trauma. The wound is located on the Left Lower Leg. The wound measures 1.7cm length x 1cm width x 0.1cm depth; 1.335cm^2 area and 0.134cm^3 volume. There is Fat Layer (Subcutaneous Tissue) Exposed exposed. There is a medium amount of serous drainage noted. The wound margin is flat and intact. There is large (67-100%) pink granulation within the wound bed. There is no necrotic tissue within the wound  bed. 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 There was a Four Layer Compression Therapy Procedure by Cornell Barman, RN. Post procedure Diagnosis Wound #35: Same as Pre-Procedure Notes: Patient tolerating wraps well.[address] Adam Merritt, Adam Merritt ([id number]) Wound Cleansing: Wound #35 Left Lower Leg: May shower with protection. - Do not get your wrap wet Primary Wound Dressing: Wound #35 Left Lower Leg: Hydrafera Blue Ready Transfer Secondary Dressing: Wound #35 Left Lower Leg: ABD pad Dressing Change Frequency: Change dressing every week Follow-up Appointments: Return Appointment in 1 week. Edema Control: 4-Layer Compression System - Left Lower Extremity. Elevate legs to the level of the heart and pump ankles as often as possible Compression Pump: Use  compression pump on left lower extremity for 60 minutes, twice daily. Compression Pump: Use compression pump on right lower extremity for 60 minutes, twice daily. 1. I would recommend currently that we go ahead and continue with the wound care measures as before on the lower extremity. He will continue with his Velcro wrap on the right and on the left where utilizing the [facility] along with a 4-layer compression wrap. 2. I am also can recommend he needs to continue to elevate his legs is much as possible. We will see patient back for reevaluation in 1 week here in the clinic. If anything worsens or changes patient will contact our office for additional recommendations. Electronic Signature(s) Signed: 11/24/2019 3:45:55 PM By: Worthy Keeler PA-C Entered By: Worthy Keeler on 11/24/2019 15:45:55 Adam Merritt  ([id number]) -------------------------------------------------------------------------------- SuperBill Details Patient Name: Adam Merritt Date of Service: 11/24/2019 Medical Record Number: [id number] Patient Account Number: [id number] Date of Birth/Sex: Jun 03, 1946 (74 y.o. 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 Facility Procedures CPT4 Code: 34035248 Description: (Facility Use Only) 29581LT - [facility] LWR LT LEG Modifier: Quantity: 1 Physician Procedures CPT4 Code: 1859093 Description: 11216 - WC PHYS LEVEL 3 - EST PT Modifier: Quantity: 1 CPT4 Code: Description: ICD-10 Diagnosis Description I89.0 Lymphedema, not elsewhere classified 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

## 2019-11-25 ENCOUNTER — Ambulatory Visit: Payer: TRICARE For Life (TFL) | Admitting: Nurse Practitioner

## 2019-11-25 NOTE — Progress Notes (Signed)
Adam Merritt ([id number]) Visit Report for 11/24/2019 Arrival Information Details Patient Name: Adam Merritt Date of Service: 11/24/2019 2:30 PM Medical Record Number: [id number] Patient Account Number: [id number] Date of Birth/Sex: 06/27/1945 (74 y.o. M) Treating RN: Montey Hora Primary Care Rayann Jolley: [name] Other Clinician: Referring Manali Mcelmurry: [name] Treating Mabell Esguerra/Extender: Melburn Hake, [name]: 8 Visit Information History Since Last Visit Added or deleted any medications: No Patient Arrived: Cane Any new allergies or adverse reactions: No Arrival Time: 14:10 Had a fall or experienced change in No Accompanied By: wife activities of daily living that may affect Transfer Assistance: None risk of falls: Patient Identification Verified: Yes Signs or symptoms of abuse/neglect since last visito No Secondary Verification Process Completed: Yes Hospitalized since last visit: No Patient Has Alerts: Yes Implantable device outside of the clinic excluding No Patient Alerts: Patient on Blood Thinner cellular tissue based products placed in the center DMII since last visit: Aspirin 325 Has Dressing in Place as Prescribed: Yes Has Compression in Place as Prescribed: Yes Pain Present Now: No Electronic Signature(s) Signed: 11/24/2019 4:39:14 PM By: Montey Hora Entered By: Montey Hora on 11/24/2019 14:11:12 Adam Merritt ([id number]) -------------------------------------------------------------------------------- Compression Therapy Details Patient Name: Adam Merritt Date of Service: 11/24/2019 2:30 PM Medical Record Number: [id number] Patient Account Number: [id number] Date of Birth/Sex: 1945/11/06 (74 y.o. M) Treating RN: Cornell Barman Primary Care Annmarie Plemmons: [name] Other Clinician: Referring Minda Faas: [name] Treating Ami Thornsberry/Extender: Melburn Hake, [name]: 8 Compression Therapy Performed for Wound Assessment: Wound #35  Left Lower Leg Performed By: Clinician Cornell Barman, RN Compression Type: Four Layer Post Procedure Diagnosis Same as Pre-procedure Notes Patient tolerating wraps well. Electronic Signature(s) Signed: 11/24/2019 4:54:26 PM By: Gretta Cool, BSN, RN, CWS, Kim RN, BSN Entered By: Gretta Cool, BSN, RN, CWS, Kim on 11/24/2019 14:50:17 Adam Merritt ([id number]) -------------------------------------------------------------------------------- Encounter Discharge Information Details Patient Name: Adam Merritt Date of Service: 11/24/2019 2:30 PM Medical Record Number: [id number] Patient Account Number: [id number] Date of Birth/Sex: 03/11/1946 (74 y.o. M) Treating RN: Cornell Barman Primary Care Ceniyah Thorp: [name] Other Clinician: Referring Lenice Koper: [name] Treating Tanveer Dobberstein/Extender: Melburn Hake, [name]: 8 Encounter Discharge Information Items Discharge Condition: Stable Ambulatory Status: Wheelchair Discharge Destination: Home Transportation: Private Auto Accompanied By: spouse Schedule Follow-up Appointment: Yes Clinical Summary of Care: Electronic Signature(s) Signed: 11/24/2019 4:54:26 PM By: Gretta Cool, BSN, RN, CWS, Kim RN, BSN Entered By: Gretta Cool, BSN, RN, CWS, Kim on 11/24/2019 14:52:33 Adam Merritt ([id number]) -------------------------------------------------------------------------------- General Visit Notes Details Patient Name: Adam Merritt Date of Service: 11/24/2019 2:30 PM Medical Record Number: [id number] Patient Account Number: [id number] Date of Birth/Sex: 07-11-1945 (74 y.o. M) Treating RN: Cornell Barman Primary Care Quanda Pavlicek: [name] Other Clinician: Referring Atharv Barriere: [name] Treating Iktan Aikman/Extender: Melburn Hake, [name]: 8 Notes Patient arrived today unsteady and dizzy he states from the heat. Once seated he began feeling better. Patient's vitals WNL. Patient will leave in a wheelchair instead of walking with his cane as a  precautionary measure. Patient understands and agrees. Electronic Signature(s) Signed: 11/24/2019 3:02:43 PM By: Gretta Cool, BSN, RN, CWS, Kim RN, BSN Entered By: Gretta Cool, BSN, RN, CWS, Kim on 11/24/2019 15:02:43 Adam Merritt ([id number]) -------------------------------------------------------------------------------- Lower Extremity Assessment Details Patient Name: Adam Merritt Date of Service: 11/24/2019 2:30 PM Medical Record Number: [id number] Patient Account Number: [id number] Date of Birth/Sex: 1946/03/18 (74 y.o. M) Treating RN: Montey Hora Primary Care Darcella Shiffman: [name] Other Clinician: Referring Landis Dowdy: [name] Treating  Ilsa Bonello/Extender: Melburn Hake, [name]: 8 Edema Assessment Assessed: [Left: No] [Right: No] Edema: [Left: Ye] [Right: s] Calf Left: Right: Point of Measurement: 31 cm From Medial Instep 54 cm cm Ankle Left: Right: Point of Measurement: 11 cm From Medial Instep 33 cm cm Vascular Assessment Pulses: Dorsalis Pedis Palpable: [Left:Yes] Electronic Signature(s) Signed: 11/24/2019 4:39:14 PM By: Montey Hora Entered By: Montey Hora on 11/24/2019 14:16:18 Adam Merritt ([id number]) -------------------------------------------------------------------------------- Multi Wound Chart Details Patient Name: Adam Merritt Date of Service: 11/24/2019 2:30 PM Medical Record Number: [id number] Patient Account Number: [id number] Date of Birth/Sex: Feb 23, 1946 (74 y.o. M) Treating RN: Cornell Barman Primary Care Naya Ilagan: [name] Other Clinician: Referring Glenford Garis: [name] Treating Marliss Buttacavoli/Extender: Melburn Hake, [name]: 8 Vital Signs Height(in): Pulse(bpm): 89 Weight(lbs): Blood Pressure(mmHg): 153/79 Body Mass Index(BMI): Temperature(F): 98.2 Respiratory Rate(breaths/min): 22 Photos: [N/A:N/A] Wound Location: Left Lower Leg N/A N/A Wounding Event: Trauma N/A N/A Primary Etiology: Skin Tear N/A N/A Comorbid  History: Anemia, Lymphedema, Chronic N/A N/A Obstructive Pulmonary Disease (COPD), Sleep Apnea, Congestive Heart Failure, Coronary Artery Disease, Hypertension, Peripheral Venous Disease, Type II Diabetes, Osteoarthritis, Neuropathy, Received Chemotherapy Date Acquired: 11/07/2019 N/A N/A Weeks of Treatment: 2 N/A N/A Wound Status: Open N/A N/A Measurements L x W x D (cm) 1.7x1x0.1 N/A N/A Area (cm) : 1.335 N/A N/A Volume (cm) : 0.134 N/A N/A % Reduction in Area: 75.10% N/A N/A % Reduction in Volume: 75.00% N/A N/A Classification: Full Thickness Without Exposed N/A N/A Support Structures Exudate Amount: Medium N/A N/A Exudate Type: Serous N/A N/A Exudate Color: amber N/A N/A Wound Margin: Flat and Intact N/A N/A Granulation Amount: Large (67-100%) N/A N/A Granulation Quality: Pink N/A N/A Necrotic Amount: None Present (0%) N/A N/A Exposed Structures: Fat Layer (Subcutaneous Tissue) N/A N/A Exposed: Yes Fascia: No Tendon: No Muscle: No Joint: No Bone: No Epithelialization: Small (1-33%) N/A N/A Treatment Notes Electronic Signature(s) NAOKI, MIGLIACCIO ([id number]) Signed: 11/24/2019 4:54:26 PM By: Gretta Cool, BSN, RN, CWS, Kim RN, BSN Entered By: Gretta Cool, BSN, RN, CWS, Kim on 11/24/2019 14:49:28 Adam Merritt ([id number]) -------------------------------------------------------------------------------- [place] Details Patient Name: Adam Merritt Date of Service: 11/24/2019 2:30 PM Medical Record Number: [id number] Patient Account Number: [id number] Date of Birth/Sex: 12/22/1945 (74 y.o. M) Treating RN: Cornell Barman Primary Care Vernita Tague: [name] Other Clinician: Referring Decorian Schuenemann: [name] Treating Gerturde Kuba/Extender: Melburn Hake, [name]: 8 Active Inactive Abuse / Safety / Falls / Self Care Management Nursing Diagnoses: Potential for falls Goals: Patient will remain injury free related to falls Date Initiated: 09/26/2019 Target  Resolution Date: 12/13/2019 Goal Status: Active Interventions: Assess fall risk on admission and as needed Notes: Electronic Signature(s) Signed: 11/24/2019 4:54:26 PM By: Gretta Cool, BSN, RN, CWS, Kim RN, BSN Entered By: Gretta Cool, BSN, RN, CWS, Kim on 11/24/2019 14:49:20 Adam Merritt ([id number]) -------------------------------------------------------------------------------- Pain Assessment Details Patient Name: Adam Merritt Date of Service: 11/24/2019 2:30 PM Medical Record Number: [id number] Patient Account Number: [id number] Date of Birth/Sex: August 07, 1945 (74 y.o. M) Treating RN: Montey Hora Primary Care Chonte Ricke: [name] Other Clinician: Referring Stephanine Reas: [name] Treating Glennys Schorsch/Extender: Melburn Hake, [name]: 8 Active Problems Location of Pain Severity and Description of Pain Patient Has Paino No Site Locations Pain Management and Medication Current Pain Management: Electronic Signature(s) Signed: 11/24/2019 4:39:14 PM By: Montey Hora Entered By: Montey Hora on 11/24/2019 14:15:12 Adam Merritt ([id number]) -------------------------------------------------------------------------------- Patient/Caregiver Education Details Patient Name: Adam Merritt Date of Service: 11/24/2019 2:30 PM Medical  Record Number: [id number] Patient Account Number: [id number] Date of Birth/Gender: [birth year] (74 y.o. M) Treating RN: Cornell Barman Primary Care Physician: [name] Other Clinician: Referring Physician: [name] Treating Physician/Extender: Sharalyn Ink in Treatment: 8 Education Assessment Education Provided To: Patient Education Topics Provided Venous: Handouts: Controlling Swelling with Compression Stockings Methods: Demonstration, Explain/Verbal Responses: State content correctly Wound/Skin Impairment: Handouts: Caring for Your Ulcer Methods: Demonstration, Explain/Verbal Responses: State content correctly Electronic  Signature(s) Signed: 11/24/2019 4:54:26 PM By: Gretta Cool, BSN, RN, CWS, Kim RN, BSN Entered By: Gretta Cool, BSN, RN, CWS, Kim on 11/24/2019 14:51:47 Adam Merritt ([id number]) -------------------------------------------------------------------------------- Wound Assessment Details Patient Name: Adam Merritt Date of Service: 11/24/2019 2:30 PM Medical Record Number: [id number] Patient Account Number: [id number] Date of Birth/Sex: [birth year] (74 y.o. M) Treating RN: Montey Hora Primary Care Yeni Jiggetts: [name] Other Clinician: Referring Caroljean Monsivais: [name] Treating Prosperity Darrough/Extender: Melburn Hake, [name]: 8 Wound Status Wound Number: 35 Primary Skin Tear Etiology: Wound Location: Left Lower Leg Wound Open Wounding Event: Trauma Status: Date Acquired: 11/07/2019 Comorbid Anemia, Lymphedema, Chronic Obstructive Pulmonary Weeks Of Treatment: 2 History: Disease (COPD), Sleep Apnea, Congestive Heart Failure, Clustered Wound: No Coronary Artery Disease, Hypertension, Peripheral Venous Disease, Type II Diabetes, Osteoarthritis, Neuropathy, Received Chemotherapy Photos Wound Measurements Length: (cm) 1.7 Width: (cm) 1 Depth: (cm) 0.1 Area: (cm) 1.335 Volume: (cm) 0.134 % Reduction in Area: 75.1% % Reduction in Volume: 75% Epithelialization: Small (1-33%) Wound Description Classification: Full Thickness Without Exposed Support Structu Wound Margin: Flat and Intact Exudate Amount: Medium Exudate Type: Serous Exudate Color: amber res Foul Odor After Cleansing: No Slough/Fibrino No Wound Bed Granulation Amount: Large (67-100%) Exposed Structure Granulation Quality: Pink Fascia Exposed: No Necrotic Amount: None Present (0%) Fat Layer (Subcutaneous Tissue) Exposed: Yes Tendon Exposed: No Muscle Exposed: No Joint Exposed: No Bone Exposed: No Treatment Notes Wound #35 (Left Lower Leg) Notes hblue, 4LL NAKEEM, MURNANE ([id number]) Electronic  Signature(s) Signed: 11/24/2019 2:28:38 PM By: Montey Hora Entered By: Montey Hora on 11/24/2019 14:28:37 Adam Merritt ([id number]) -------------------------------------------------------------------------------- Vitals Details Patient Name: Adam Merritt Date of Service: 11/24/2019 2:30 PM Medical Record Number: [id number] Patient Account Number: [id number] Date of Birth/Sex: November 30, 1945 (74 y.o. M) Treating RN: Montey Hora Primary Care Torria Fromer: [name] Other Clinician: Referring Dewie Ahart: [name] Treating Harvir Patry/Extender: Melburn Hake, [name]: 8 Vital Signs Time Taken: 14:14 Temperature (F): 98.2 Pulse (bpm): 77 Respiratory Rate (breaths/min): 22 Blood Pressure (mmHg): 153/79 Reference Range: 80 - 120 mg / dl Electronic Signature(s) Signed: 11/24/2019 4:39:14 PM By: Montey Hora Entered By: Montey Hora on 11/24/2019 14:15:06

## 2019-11-27 ENCOUNTER — Telehealth: Payer: Self-pay

## 2019-11-27 NOTE — Telephone Encounter (Signed)
Confirmed and screened for 12-01-19 ov.

## 2019-12-01 ENCOUNTER — Other Ambulatory Visit
Admission: RE | Admit: 2019-12-01 | Discharge: 2019-12-01 | Disposition: A | Discharge status: Home / Self Care | Payer: Medicare PPO | Attending: Nurse Practitioner | Admitting: Nurse Practitioner

## 2019-12-01 ENCOUNTER — Encounter: Payer: Self-pay | Admitting: Nurse Practitioner

## 2019-12-01 ENCOUNTER — Other Ambulatory Visit: Payer: Self-pay

## 2019-12-01 ENCOUNTER — Ambulatory Visit (INDEPENDENT_AMBULATORY_CARE_PROVIDER_SITE_OTHER): Payer: Medicare PPO | Admitting: Nurse Practitioner

## 2019-12-01 VITALS — BP 118/61 | HR 72 | Temp 97.3°F | Resp 16 | Ht 66.0 in | Wt 299.0 lb

## 2019-12-01 DIAGNOSIS — I10 Essential (primary) hypertension: Secondary | ICD-10-CM

## 2019-12-01 DIAGNOSIS — I890 Lymphedema, not elsewhere classified: Secondary | ICD-10-CM

## 2019-12-01 DIAGNOSIS — R5383 Other fatigue: Secondary | ICD-10-CM | POA: Diagnosis not present

## 2019-12-01 DIAGNOSIS — R531 Weakness: Secondary | ICD-10-CM | POA: Diagnosis not present

## 2019-12-01 DIAGNOSIS — E1165 Type 2 diabetes mellitus with hyperglycemia: Secondary | ICD-10-CM | POA: Diagnosis not present

## 2019-12-01 DIAGNOSIS — D619 Aplastic anemia, unspecified: Secondary | ICD-10-CM

## 2019-12-01 DIAGNOSIS — J449 Chronic obstructive pulmonary disease, unspecified: Secondary | ICD-10-CM | POA: Diagnosis not present

## 2019-12-01 DIAGNOSIS — Z862 Personal history of diseases of the blood and blood-forming organs and certain disorders involving the immune mechanism: Secondary | ICD-10-CM | POA: Insufficient documentation

## 2019-12-01 DIAGNOSIS — F4310 Post-traumatic stress disorder, unspecified: Secondary | ICD-10-CM | POA: Diagnosis not present

## 2019-12-01 LAB — BASIC METABOLIC PANEL
Anion gap: 17 — ABNORMAL HIGH (ref 5–15)
BUN: 26 mg/dL — ABNORMAL HIGH (ref 8–23)
CO2: 26 mmol/L (ref 22–32)
Calcium: 9 mg/dL (ref 8.9–10.3)
Chloride: 92 mmol/L — ABNORMAL LOW (ref 98–111)
Creatinine, Ser: 2.27 mg/dL — ABNORMAL HIGH (ref 0.61–1.24)
GFR calc Af Amer: 32 mL/min — ABNORMAL LOW (ref >60)
GFR calc non Af Amer: 27 mL/min — ABNORMAL LOW (ref >60)
Glucose, Bld: 212 mg/dL — ABNORMAL HIGH (ref 70–99)
Potassium: 2.9 mmol/L — ABNORMAL LOW (ref 3.5–5.1)
Sodium: 135 mmol/L (ref 135–145)

## 2019-12-01 LAB — CBC
HCT: 37.2 % — ABNORMAL LOW (ref 39.0–52.0)
Hemoglobin: 12.9 g/dL — ABNORMAL LOW (ref 13.0–17.0)
MCH: 31.5 pg (ref 26.0–34.0)
MCHC: 34.7 g/dL (ref 30.0–36.0)
MCV: 90.7 fL (ref 80.0–100.0)
Platelets: 153 10*3/uL (ref 150–400)
RBC: 4.1 MIL/uL — ABNORMAL LOW (ref 4.22–5.81)
RDW: 12 % (ref 11.5–15.5)
WBC: 7.8 10*3/uL (ref 4.0–10.5)
nRBC: 0 % (ref 0.0–0.2)

## 2019-12-01 LAB — POCT GLYCOSYLATED HEMOGLOBIN (HGB A1C): Hemoglobin A1C: 6.4 % — AB (ref 4.0–5.6)

## 2019-12-01 LAB — VITAMIN B12: Vitamin B-12: 499 pg/mL (ref 180–914)

## 2019-12-01 LAB — FERRITIN: Ferritin: 36 ng/mL (ref 24–336)

## 2019-12-01 LAB — FOLATE: Folate: 32 ng/mL (ref >5.9)

## 2019-12-01 LAB — IRON AND TIBC
Iron: 68 ug/dL (ref 45–182)
Saturation Ratios: 25 % (ref 17.9–39.5)
TIBC: 274 ug/dL (ref 250–450)
UIBC: 206 ug/dL

## 2019-12-01 MED ORDER — PRAZOSIN HCL 1 MG PO CAPS: 3.0000 mg | ORAL_CAPSULE | Freq: Every day | ORAL | 3 refills | Status: DC

## 2019-12-01 NOTE — Progress Notes (Signed)
Potassium very low and renal functions elevated. Waiting on all reslts from hospital.

## 2019-12-01 NOTE — Progress Notes (Signed)
[facility] [place], [place] [address]  Internal MEDICINE  Office Visit Note  Patient Name: Adam Merritt  [id number]  [id number]  Date of Service: 12/08/2019  Chief Complaint  Patient presents with  . Follow-up  . Diabetes  . COPD  . Hypertension  . Hyperlipidemia  . Quality Metric Gaps    eye exam    The patient is here for routine follow up. He states that he feels fatigued. Was recently treated for bronchitis. Did round of antibiotics. Helped get rid of bronchitis, however, still feels very fatigued and even a little sluggish. This was going on prior to treatment with antibiotics. He states that he does not feel sick or anything like that. He states that he just doesn't feel good. States that he was so weak yesterday, he "rolled" out of bed and hit his head on a night table. He is sore today.  Blood sugars are well controlled. His HgbA1c is 6.4 today.  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 Last lab check was 03/2019.  [facility]. He is on multiple new medications. These could be contributing to the symptoms he is currently experiencing.       Current Medication: Outpatient Encounter Medications as of 12/01/2019  Medication Sig  . albuterol (VENTOLIN HFA) 108 (90 Base) MCG/ACT inhaler Inhale 2 puffs into the lungs every 6 (six) hours as needed for wheezing or shortness of breath.  Marland Kitchen aspirin 325 MG EC tablet Take 325 mg by mouth daily.  Marland Kitchen atorvastatin (LIPITOR) 40 MG tablet Take 40 mg by mouth at bedtime.   . carvedilol (COREG) 25 MG tablet Take 25 mg by mouth 2 (two) times daily with a meal.  . docusate sodium (COLACE) 50 MG capsule Take 50 mg by mouth 2 (two) times daily.  . febuxostat (ULORIC) 40 MG tablet Take 1 tablet (40 mg total) by mouth daily.  . furosemide (LASIX) 40 MG tablet Take 1 tablet (40 mg total) by mouth daily.  Marland Kitchen gabapentin (NEURONTIN) 300 MG capsule Take 2 cap in the morning, 1 cap in the evening and 2 cap at  night.  . hydrALAZINE (APRESOLINE) 25 MG tablet Take 25 mg by mouth 3 (three) times daily.   . indapamide (LOZOL) 2.5 MG tablet Take 1 tablet (2.5 mg total) by mouth daily.  . Insulin Glargine (BASAGLAR KWIKPEN) 100 UNIT/ML INJECT 30 TO 36 UNITS UNDER THE SKIN EVERY DAY  . Insulin Pen Needle (BD PEN NEEDLE NANO U/F) 32G X 4 MM MISC Use as directed with insulin DX e11.65  . ipratropium-albuterol (DUONEB) 0.5-2.5 (3) MG/3ML SOLN Take 3 mLs by nebulization every 4 (four) hours as needed (for shortness of breath).  . liraglutide (VICTOZA) 18 MG/3ML SOPN INJECT 1.8MG  INTO THE SKIN DAILY  . metolazone (ZAROXOLYN) 2.5 MG tablet   . Multiple Vitamin (MULTIVITAMIN WITH MINERALS) TABS tablet Take 1 tablet by mouth daily.  . ondansetron (ZOFRAN) 4 MG tablet Take 1 tablet (4 mg total) by mouth every 8 (eight) hours as needed.  [name] 51V MISC Use twice daily diag e11.65  . pentoxifylline (TRENTAL) 400 MG CR tablet TAKE 1 TABLET BY MOUTH THREE TIMES DAILY FOR CLAUDICATION  . potassium chloride SA (KLOR-CON) 20 MEQ tablet Take 1 tablet (20 mEq total) by mouth 2 (two) times daily. (Patient taking differently: Take 20 mEq by mouth 3 (three) times daily. )  . prazosin (MINIPRESS) 1 MG capsule Take 3 capsules (3 mg  total) by mouth at bedtime.  . sennosides-docusate sodium (SENOKOT-S) 8.6-50 MG tablet Take 1-2 tablets by mouth 2 (two) times daily.  . sertraline (ZOLOFT) 100 MG tablet Take 100 mg by mouth daily.   . traZODone (DESYREL) 50 MG tablet Take 50 mg by mouth at bedtime.  . [DISCONTINUED] cefUROXime (CEFTIN) 500 MG tablet Take 1 tablet (500 mg total) by mouth 2 (two) times daily with a meal.  . [DISCONTINUED] prazosin (MINIPRESS) 2 MG capsule Take 2 mg by mouth at bedtime.    No facility-administered encounter medications on file as of 12/01/2019.    Surgical History: Past Surgical History:  Procedure Laterality Date  . AMPUTATION TOE Right 06/23/2018   Procedure: AMPUTATION TOE;   Surgeon: Sharlotte Alamo, DPM;  Location: [facility] ORS;  Service: Podiatry;  Laterality: Right;  . CHOLECYSTECTOMY    . CORONARY ARTERY BYPASS GRAFT      Medical History: Past Medical History:  Diagnosis Date  . Anemia   . CAD (coronary artery disease)   . Chest pain   . Coronary artery disease   . Diabetes mellitus without complication ([place])   . Diastolic CHF ([place])   . Esophageal reflux   . Herpes zoster without mention of complication   . Hyperlipidemia   . Hypertension   . Insomnia   . Lumbago   . Lymphedema   . Neuropathy in diabetes ([place])   . Obstructive chronic bronchitis without exacerbation ([place])   . Other malaise and fatigue   . Stroke ([place])   . Varicose veins     Family History: Family History  Problem Relation Age of Onset  . Diabetes Mother   . Hyperlipidemia Mother   . Hypertension Mother   . Diabetes Father   . Hyperlipidemia Father   . Hypertension Father   . CAD Father     Social History   Socioeconomic History  . Marital status: Married    Spouse name: Not on file  . Number of children: Not on file  . Years of education: Not on file  . Highest education level: Not on file  Occupational History  . Occupation: retired  Tobacco Use  . Smoking status: Never Smoker  . Smokeless tobacco: Never Used  Vaping Use  . Vaping Use: Never used  Substance and Sexual Activity  . Alcohol use: No    Comment: quit alcohol 30 years ago  . Drug use: No  . Sexual activity: Not Currently  Other Topics Concern  . Not on file  Social History Narrative   Lives at home with wife, uses a wheelchair now   Social Determinants of Health   Financial Resource Strain: Low Risk   . Difficulty of Paying Living Expenses: Not hard at all  Food Insecurity: No Food Insecurity  . Worried About Charity fundraiser in the Last Year: Never true  . Ran Out of Food in the Last Year: Never true  Transportation Needs: No Transportation Needs  . Lack of Transportation (Medical): No   . Lack of Transportation (Non-Medical): No  Physical Activity: Inactive  . Days of Exercise per Week: 0 days  . Minutes of Exercise per Session: 0 min  Stress: No Stress Concern Present  . Feeling of Stress : Only a little  Social Connections: Moderately Integrated  . Frequency of Communication with Friends and Family: More than three times a week  . Frequency of Social Gatherings with Friends and Family: More than three times a week  . Attends Religious  Services: More than 4 times per year  . Active Member of Clubs or Organizations: No  . Attends Archivist Meetings: Never  . Marital Status: Married  Human resources officer Violence: Not At Risk  . Fear of Current or Ex-Partner: No  . Emotionally Abused: No  . Physically Abused: No  . Sexually Abused: No      Review of Systems  Constitutional: Positive for fatigue. Negative for activity change.       Patient states that he just is feeling poorly lately.   HENT: Negative for congestion, postnasal drip and rhinorrhea.   Respiratory: Positive for shortness of breath. Negative for cough and chest tightness.        States that his breathing has been doing well   Cardiovascular: Positive for leg swelling. Negative for chest pain and palpitations.       Stable leg swelling.   Gastrointestinal: Negative for constipation, diarrhea, nausea and vomiting.  Endocrine: Negative for cold intolerance, heat intolerance, polydipsia and polyuria.       Blood sugars doing well   Musculoskeletal: Positive for arthralgias, back pain and myalgias.  Skin: Positive for rash and wound.       Patient continues to see wound care specialist.   Allergic/Immunologic: Positive for environmental allergies.  Neurological: Negative for dizziness, light-headedness and headaches.  Hematological: Negative for adenopathy.  Psychiatric/Behavioral: Positive for dysphoric mood. Negative for agitation. The patient is nervous/anxious.        Improved. [facility].    Today's Vitals   12/01/19 0940  BP: 118/61  Pulse: 72  Resp: 16  Temp: (!) 97.3 F (36.3 C)  SpO2: 97%  Weight: 299 lb (135.6 kg)  Height: 5\' 6"  (1.676 m)   Body mass index is 48.26 kg/m.  Physical Exam Vitals and nursing note reviewed.  Constitutional:      General: He is not in acute distress.    Appearance: Normal appearance. He is well-developed. He is obese. He is not diaphoretic.  HENT:     Head: Normocephalic and atraumatic.     Nose: Nose normal.     Mouth/Throat:     Pharynx: No oropharyngeal exudate.  Eyes:     Pupils: Pupils are equal, round, and reactive to light.  Neck:     Thyroid: No thyromegaly.     Vascular: No JVD.     Trachea: No tracheal deviation.  Cardiovascular:     Rate and Rhythm: Normal rate and regular rhythm.     Heart sounds: Normal heart sounds. No murmur heard.  No friction rub. No gallop.   Pulmonary:     Effort: Pulmonary effort is normal. No respiratory distress.     Breath sounds: Normal breath sounds. No wheezing or rales.  Chest:     Chest wall: No tenderness.  Abdominal:     Palpations: Abdomen is soft.  Musculoskeletal:        General: Normal range of motion.     Cervical back: Normal range of motion and neck supple.  Lymphadenopathy:     Cervical: No cervical adenopathy.  Skin:    General: Skin is warm and dry.     Comments: Legs currently wrapped in clean and dry dressings.   Neurological:     Mental Status: He is alert and oriented to person, place, and time. Mental status is at baseline.     Cranial Nerves: No cranial nerve deficit.  Psychiatric:  Mood and Affect: Mood normal.        Behavior: Behavior normal.        Thought Content: Thought content normal.        Judgment: Judgment normal.    Assessment/Plan: 1. Uncontrolled type 2 diabetes mellitus with hyperglycemia (HCC) - POCT HgB A1C 6.4 today. Continue diabetic medication as prescribed.   2. Aplastic anemia,  unspecified ([place]) Will check CBC with iron panel for further evaluation.   3. Chronic obstructive pulmonary disease, unspecified COPD type ([place]) Stable. Continue inhalers and respiratory medication as prescribed   4. Essential hypertension Well managed. Continue bp medication as prescribed   5. PTSD (post-traumatic stress disorder) [facility]. Refill prazosin 1mg , taking three capsules at night.  - prazosin (MINIPRESS) 1 MG capsule; Take 3 capsules (3 mg total) by mouth at bedtime.  Dispense: 90 capsule; Refill: 3  6. Lymphedema of both lower extremities Continue regular visits with wound care as scheduled.   General Counseling: Taren verbalizes understanding of the findings of todays visit and agrees with plan of treatment. I have discussed any further diagnostic evaluation that may be needed or ordered today. We also reviewed his medications today. he has been encouraged to call the office with any questions or concerns that should arise related to todays visit.  This patient was seen by [name] FNP Collaboration with Dr [name] as a part of collaborative care agreement  Orders Placed This Encounter  Procedures  . POCT HgB A1C    Meds ordered this encounter  Medications  . prazosin (MINIPRESS) 1 MG capsule    Sig: Take 3 capsules (3 mg total) by mouth at bedtime.    Dispense:  90 capsule    Refill:  3    [facility]    Order Specific Question:   Supervising Provider    Answer:   [name] [[id number]]    Total time spent: 30 Minutes   Time spent includes review of chart, medications, test results, and follow up plan with the patient.      Dr [name] Internal medicine

## 2019-12-02 ENCOUNTER — Encounter: Payer: Medicare PPO | Admitting: Physician Assistant

## 2019-12-02 DIAGNOSIS — L97222 Non-pressure chronic ulcer of left calf with fat layer exposed: Secondary | ICD-10-CM | POA: Diagnosis not present

## 2019-12-02 DIAGNOSIS — I509 Heart failure, unspecified: Secondary | ICD-10-CM | POA: Diagnosis not present

## 2019-12-02 DIAGNOSIS — I890 Lymphedema, not elsewhere classified: Secondary | ICD-10-CM | POA: Diagnosis not present

## 2019-12-02 DIAGNOSIS — E11622 Type 2 diabetes mellitus with other skin ulcer: Secondary | ICD-10-CM | POA: Diagnosis not present

## 2019-12-02 DIAGNOSIS — G4730 Sleep apnea, unspecified: Secondary | ICD-10-CM | POA: Diagnosis not present

## 2019-12-02 DIAGNOSIS — L97812 Non-pressure chronic ulcer of other part of right lower leg with fat layer exposed: Secondary | ICD-10-CM | POA: Diagnosis not present

## 2019-12-02 DIAGNOSIS — I110 Hypertensive heart disease with heart failure: Secondary | ICD-10-CM | POA: Diagnosis not present

## 2019-12-02 DIAGNOSIS — E1151 Type 2 diabetes mellitus with diabetic peripheral angiopathy without gangrene: Secondary | ICD-10-CM | POA: Diagnosis not present

## 2019-12-02 DIAGNOSIS — E669 Obesity, unspecified: Secondary | ICD-10-CM | POA: Diagnosis not present

## 2019-12-02 DIAGNOSIS — I2510 Atherosclerotic heart disease of native coronary artery without angina pectoris: Secondary | ICD-10-CM | POA: Diagnosis not present

## 2019-12-02 NOTE — Progress Notes (Addendum)
HABIB, KISE ([id number]) Visit Report for 12/02/2019 Arrival Information Details Patient Name: Adam Merritt, Adam Merritt Date of Service: 12/02/2019 3:30 PM Medical Record Number: [id number] Patient Account Number: [id number] Date of Birth/Sex: 1945-07-22 (74 y.o. M) Treating RN: Cornell Barman Primary Care Jaleesa Cervi: [name] Other Clinician: Referring Seira Cody: [name] Treating Zaelynn Fuchs/Extender: Melburn Hake, [name]: 9 Visit Information History Since Last Visit Added or deleted any medications: No Patient Arrived: [place] Any new allergies or adverse reactions: No Arrival Time: 15:54 Had a fall or experienced change in No Accompanied By: wife activities of daily living that may affect Transfer Assistance: None risk of falls: Patient Identification Verified: Yes Signs or symptoms of abuse/neglect since last visito No Secondary Verification Process Completed: Yes Hospitalized since last visit: No Patient Has Alerts: Yes Implantable device outside of the clinic excluding No Patient Alerts: Patient on Blood Thinner cellular tissue based products placed in the center DMII since last visit: Aspirin 325 Has Dressing in Place as Prescribed: Yes Pain Present Now: No Electronic Signature(s) Signed: 12/02/2019 4:29:39 PM By: Sandre Kitty Entered By: Sandre Kitty on 12/02/2019 15:54:57 Adam Merritt ([id number]) -------------------------------------------------------------------------------- Clinic Level of Care Assessment Details Patient Name: Adam Merritt Date of Service: 12/02/2019 3:30 PM Medical Record Number: [id number] Patient Account Number: [id number] Date of Birth/Sex: 09-25-1945 (74 y.o. M) Treating RN: Cornell Barman Primary Care Vanderbilt Ranieri: [name] Other Clinician: Referring Jeffrey Voth: [name] Treating Joanathan Affeldt/Extender: Melburn Hake, [name]: 9 Clinic Level of Care Assessment Items TOOL 4 Quantity Score []  - Use when only an EandM is  performed on FOLLOW-UP visit 0 ASSESSMENTS - Nursing Assessment / Reassessment X - Reassessment of Co-morbidities (includes updates in patient status) 1 10 X- 1 5 Reassessment of Adherence to Treatment Plan ASSESSMENTS - Wound and Skin Assessment / Reassessment []  - Simple Wound Assessment / Reassessment - one wound 0 []  - 0 Complex Wound Assessment / Reassessment - multiple wounds []  - 0 Dermatologic / Skin Assessment (not related to wound area) ASSESSMENTS - Focused Assessment []  - Circumferential Edema Measurements - multi extremities 0 []  - 0 Nutritional Assessment / Counseling / Intervention []  - 0 Lower Extremity Assessment (monofilament, tuning fork, pulses) []  - 0 Peripheral Arterial Disease Assessment (using hand held doppler) ASSESSMENTS - Ostomy and/or Continence Assessment and Care []  - Incontinence Assessment and Management 0 []  - 0 Ostomy Care Assessment and Management (repouching, etc.) PROCESS - Coordination of Care X - Simple Patient / Family Education for ongoing care 1 15 []  - 0 Complex (extensive) Patient / Family Education for ongoing care []  - 0 Staff obtains Programmer, systems, Records, Test Results / Process Orders []  - 0 Staff telephones HHA, Nursing Homes / Clarify orders / etc []  - 0 Routine Transfer to another Facility (non-emergent condition) []  - 0 Routine Hospital Admission (non-emergent condition) []  - 0 New Admissions / Biomedical engineer / Ordering NPWT, Apligraf, etc. []  - 0 Emergency Hospital Admission (emergent condition) X- 1 10 Simple Discharge Coordination []  - 0 Complex (extensive) Discharge Coordination PROCESS - Special Needs []  - Pediatric / Minor Patient Management 0 []  - 0 Isolation Patient Management []  - 0 Hearing / Language / Visual special needs []  - 0 Assessment of Community assistance (transportation, D/C planning, etc.) []  - 0 Additional assistance / Altered mentation []  - 0 Support Surface(s) Assessment (bed,  cushion, seat, etc.) INTERVENTIONS - Wound Cleansing / Measurement Adam Merritt, Adam Merritt. ([id number]) []  - 0 Simple Wound Cleansing - one wound []  - 0  Complex Wound Cleansing - multiple wounds X- 1 5 Wound Imaging (photographs - any number of wounds) []  - 0 Wound Tracing (instead of photographs) []  - 0 Simple Wound Measurement - one wound []  - 0 Complex Wound Measurement - multiple wounds INTERVENTIONS - Wound Dressings []  - Small Wound Dressing one or multiple wounds 0 []  - 0 Medium Wound Dressing one or multiple wounds []  - 0 Large Wound Dressing one or multiple wounds []  - 0 Application of Medications - topical []  - 0 Application of Medications - injection INTERVENTIONS - Miscellaneous []  - External ear exam 0 []  - 0 Specimen Collection (cultures, biopsies, blood, body fluids, etc.) []  - 0 Specimen(s) / Culture(s) sent or taken to Lab for analysis []  - 0 Patient Transfer (multiple staff / Civil Service fast streamer / Similar devices) []  - 0 Simple Staple / Suture removal (25 or less) []  - 0 Complex Staple / Suture removal (26 or more) []  - 0 Hypo / Hyperglycemic Management (close monitor of Blood Glucose) []  - 0 Ankle / Brachial Index (ABI) - do not check if billed separately X- 1 5 Vital Signs Has the patient been seen at the hospital within the last three years: Yes Total Score: 50 Level Of Care: New/Established - Level 2 Electronic Signature(s) Signed: 12/02/2019 5:48:04 PM By: Gretta Cool, BSN, RN, CWS, Kim RN, BSN Entered By: Gretta Cool, BSN, RN, CWS, Kim on 12/02/2019 16:10:39 Adam Merritt ([id number]) -------------------------------------------------------------------------------- Encounter Discharge Information Details Patient Name: Adam Merritt Date of Service: 12/02/2019 3:30 PM Medical Record Number: [id number] Patient Account Number: [id number] Date of Birth/Sex: 1946/02/25 (74 y.o. M) Treating RN: Cornell Barman Primary Care Wolfgang Finigan: [name] Other Clinician: Referring  Sapphire Tygart: [name] Treating Aseel Truxillo/Extender: Melburn Hake, [name]: 9 Encounter Discharge Information Items Discharge Condition: Stable Ambulatory Status: Ambulatory Discharge Destination: Home Transportation: Private Auto Accompanied By: wife Schedule Follow-up Appointment: Yes Clinical Summary of Care: Electronic Signature(s) Signed: 12/02/2019 5:48:04 PM By: Gretta Cool, BSN, RN, CWS, Kim RN, BSN Entered By: Gretta Cool, BSN, RN, CWS, Kim on 12/02/2019 16:11:30 Adam Merritt ([id number]) -------------------------------------------------------------------------------- Lower Extremity Assessment Details Patient Name: Adam Merritt Date of Service: 12/02/2019 3:30 PM Medical Record Number: [id number] Patient Account Number: [id number] Date of Birth/Sex: 1945/12/13 (74 y.o. M) Treating RN: Cornell Barman Primary Care Paquita Printy: [name] Other Clinician: Referring Jaymen Fetch: [name] Treating Arael Piccione/Extender: Melburn Hake, [name]: 9 Edema Assessment Assessed: [Left: No] [Right: No] [Left: Edema] [Right: :] Calf Left: Right: Point of Measurement: 31 cm From Medial Instep 51.5 cm cm Ankle Left: Right: Point of Measurement: 11 cm From Medial Instep 33 cm cm Vascular Assessment Pulses: Dorsalis Pedis Palpable: [Left:Yes] Electronic Signature(s) Signed: 12/02/2019 5:48:04 PM By: Gretta Cool, BSN, RN, CWS, Kim RN, BSN Entered By: Gretta Cool, BSN, RN, CWS, Kim on 12/02/2019 16:07:45 Adam Merritt ([id number]) -------------------------------------------------------------------------------- Multi Wound Chart Details Patient Name: Adam Merritt Date of Service: 12/02/2019 3:30 PM Medical Record Number: [id number] Patient Account Number: [id number] Date of Birth/Sex: September 27, 1945 (74 y.o. M) Treating RN: Cornell Barman Primary Care Haynes Giannotti: [name] Other Clinician: Referring Willliam Pettet: [name] Treating Darriana Deboy/Extender: Melburn Hake, [name]: 9 Vital  Signs Height(in): Pulse(bpm): 30 Weight(lbs): Blood Pressure(mmHg): 139/63 Body Mass Index(BMI): Temperature(F): 98.6 Respiratory Rate(breaths/min): 22 Photos: [35:No Photos] [N/A:N/A] Wound Location: [35:Left Lower Leg] [N/A:N/A] Wounding Event: [35:Trauma] [N/A:N/A] Primary Etiology: [35:Skin Tear] [N/A:N/A] Comorbid History: [35:Anemia, Lymphedema, Chronic Obstructive Pulmonary Disease (COPD), Sleep Apnea, Congestive Heart Failure, Coronary Artery Disease, Hypertension, Peripheral Venous Disease, Type II  Diabetes, Osteoarthritis, Neuropathy, Received  Chemotherapy] [N/A:N/A] Date Acquired: [35:11/07/2019] [N/A:N/A] Weeks of Treatment: [35:3] [N/A:N/A] Wound Status: [35:Healed - Epithelialized] [N/A:N/A] Measurements L x W x D (cm) [35:0x0x0] [N/A:N/A] Area (cm) : [35:0] [N/A:N/A] Volume (cm) : [35:0] [N/A:N/A] % Reduction in Area: [35:100.00%] [N/A:N/A] % Reduction in Volume: [35:100.00%] [N/A:N/A] Classification: [35:Full Thickness Without Exposed Support Structures] [N/A:N/A] Exudate Amount: [35:Medium] [N/A:N/A] Exudate Type: [35:Serous] [N/A:N/A] Exudate Color: [35:amber] [N/A:N/A] Wound Margin: [35:Flat and Intact] [N/A:N/A] Granulation Amount: [35:Large (67-100%)] [N/A:N/A] Granulation Quality: [35:Pink] [N/A:N/A] Necrotic Amount: [35:None Present (0%)] [N/A:N/A] Exposed Structures: [35:Fat Layer (Subcutaneous Tissue) Exposed: Yes Fascia: No Tendon: No Muscle: No Joint: No Bone: No Small (1-33%)] [N/A:N/A N/A] Treatment Notes Electronic Signature(s) Signed: 12/02/2019 5:48:04 PM By: Gretta Cool, BSN, RN, CWS, Kim RN, BSN Entered By: Gretta Cool, BSN, RN, CWS, Kim on 12/02/2019 16:08:38 Adam Merritt ([id number]) -------------------------------------------------------------------------------- [place] Details Patient Name: Adam Merritt Date of Service: 12/02/2019 3:30 PM Medical Record Number: [id number] Patient Account Number: [id number] Date of  Birth/Sex: 11-16-1945 (74 y.o. M) Treating RN: Cornell Barman Primary Care Kaylum Shrum: [name] Other Clinician: Referring Lamarco Gudiel: [name] Treating Perlie Scheuring/Extender: Sharalyn Ink in Treatment: 9 Active Inactive Electronic Signature(s) Signed: 12/02/2019 5:48:04 PM By: Gretta Cool, BSN, RN, CWS, Kim RN, BSN Entered By: Gretta Cool, BSN, RN, CWS, Kim on 12/02/2019 16:08:13 Adam Merritt ([id number]) -------------------------------------------------------------------------------- Pain Assessment Details Patient Name: Adam Merritt Date of Service: 12/02/2019 3:30 PM Medical Record Number: [id number] Patient Account Number: [id number] Date of Birth/Sex: 01/11/1946 (74 y.o. M) Treating RN: Cornell Barman Primary Care Onyx Edgley: [name] Other Clinician: Referring Jay Haskew: [name] Treating Rennie Hack/Extender: Melburn Hake, [name]: 9 Active Problems Location of Pain Severity and Description of Pain Patient Has Paino No Site Locations Pain Management and Medication Current Pain Management: Electronic Signature(s) Signed: 12/02/2019 5:48:04 PM By: Gretta Cool, BSN, RN, CWS, Kim RN, BSN Entered By: Gretta Cool, BSN, RN, CWS, Kim on 12/02/2019 16:07:03 Adam Merritt ([id number]) -------------------------------------------------------------------------------- Patient/Caregiver Education Details Patient Name: Adam Merritt Date of Service: 12/02/2019 3:30 PM Medical Record Number: [id number] Patient Account Number: [id number] Date of Birth/Gender: 03-22-1946 (74 y.o. M) Treating RN: Cornell Barman Primary Care Physician: [name] Other Clinician: Referring Physician: [name] Treating Physician/Extender: Sharalyn Ink in Treatment: 9 Education Assessment Education Provided To: Patient Education Topics Provided Venous: Handouts: Controlling Swelling with Compression Stockings Methods: Demonstration, Explain/Verbal Responses: State content correctly Electronic  Signature(s) Signed: 12/02/2019 5:48:04 PM By: Gretta Cool, BSN, RN, CWS, Kim RN, BSN Entered By: Gretta Cool, BSN, RN, CWS, Kim on 12/02/2019 16:11:06 Adam Merritt ([id number]) -------------------------------------------------------------------------------- Wound Assessment Details Patient Name: Adam Merritt Date of Service: 12/02/2019 3:30 PM Medical Record Number: [id number] Patient Account Number: [id number] Date of Birth/Sex: January 01, 1946 (74 y.o. M) Treating RN: Cornell Barman Primary Care Jovi Zavadil: [name] Other Clinician: Referring Carder Yin: [name] Treating Justice Aguirre/Extender: Melburn Hake, [name]: 9 Wound Status Wound Number: 35 Primary Skin Tear Etiology: Wound Location: Left Lower Leg Wound Healed - Epithelialized Wounding Event: Trauma Status: Date Acquired: 11/07/2019 Comorbid Anemia, Lymphedema, Chronic Obstructive Pulmonary Weeks Of Treatment: 3 History: Disease (COPD), Sleep Apnea, Congestive Heart Failure, Clustered Wound: No Coronary Artery Disease, Hypertension, Peripheral Venous Disease, Type II Diabetes, Osteoarthritis, Neuropathy, Received Chemotherapy Wound Measurements Length: (cm) 0 Width: (cm) 0 Depth: (cm) 0 Area: (cm) Volume: (cm) % Reduction in Area: 100% % Reduction in Volume: 100% Epithelialization: Small (1-33%) 0 0 Wound Description Classification: Full Thickness Without Exposed Support Structu Wound Margin: Flat and  Intact Exudate Amount: Medium Exudate Type: Serous Exudate Color: amber res Foul Odor After Cleansing: No Slough/Fibrino No Wound Bed Granulation Amount: Large (67-100%) Exposed Structure Granulation Quality: Pink Fascia Exposed: No Necrotic Amount: None Present (0%) Fat Layer (Subcutaneous Tissue) Exposed: Yes Tendon Exposed: No Muscle Exposed: No Joint Exposed: No Bone Exposed: No Electronic Signature(s) Signed: 12/02/2019 5:48:04 PM By: Gretta Cool, BSN, RN, CWS, Kim RN, BSN Entered By: Gretta Cool, BSN, RN, CWS,  Kim on 12/02/2019 16:07:34 Adam Merritt ([id number]) -------------------------------------------------------------------------------- [place] Details Patient Name: Adam Merritt Date of Service: 12/02/2019 3:30 PM Medical Record Number: [id number] Patient Account Number: [id number] Date of Birth/Sex: [birth year] (74 y.o. M) Treating RN: Cornell Barman Primary Care Haadiya Frogge: [name] Other Clinician: Referring Jaquon Gingerich: [name] Treating Milos Milligan/Extender: Melburn Hake, [name]: 9 Vital Signs Time Taken: 15:54 Temperature (F): 98.6 Pulse (bpm): 69 Respiratory Rate (breaths/min): 22 Blood Pressure (mmHg): 139/63 Reference Range: 80 - 120 mg / dl Electronic Signature(s) Signed: 12/02/2019 4:29:39 PM By: Sandre Kitty Entered By: Sandre Kitty on 12/02/2019 15:55:20

## 2019-12-02 NOTE — Progress Notes (Addendum)
DARRIS, STAIGER ([id number]) Visit Report for 12/02/2019 Chief Complaint Document Details Patient Name: Adam Merritt Date of Service: 12/02/2019 3:30 PM Medical Record Number: [id number] Patient Account Number: [id number] Date of Birth/Sex: Nov 06, 1945 (75 y.o. M) Treating RN: Cornell Barman Primary Care Provider: [name] Other Clinician: Referring Provider: [name] Treating Provider/Extender: Melburn Hake, Hester Joslin [name]: 9 Information Obtained from: Patient Chief Complaint Bilateral LE Ulcers and right second toe ulcer 09/26/2019; patient is here for review bilateral lower leg wounds. He is well familiar to this clinic Electronic Signature(s) Signed: 12/02/2019 3:54:52 PM By: Worthy Keeler PA-C Entered By: Worthy Keeler on 12/02/2019 15:54:52 Adam Merritt ([id number]) -------------------------------------------------------------------------------- HPI Details Patient Name: Adam Merritt Date of Service: 12/02/2019 3:30 PM Medical Record Number: [id number] Patient Account Number: [id number] Date of Birth/Sex: 09-20-1945 (74 y.o. M) Treating RN: Cornell Barman Primary Care Provider: [name] Other Clinician: Referring Provider: [name] Treating Provider/Extender: Melburn Hake, Dynasty Holquin [name]: 9 History of Present Illness HPI Description: 10/18/17-He is here for initial evaluation of bilateral lower extremity ulcerations in the presence of venous insufficiency and lymphedema. He has been seen by vascular medicine in the past, Dr. Lucky Cowboy, last seen in 2016. He does have a history of abnormal ABIs, which is to be expected given his lymphedema and venous insufficiency. According to Epic, it appears that all attempts for arterial evaluation and/or angiography were not follow through with by patient. He does have a history of being seen in lymphedema clinic in 2018, stopped going approximately 6 months ago stating "it didn't do any good". He does not have lymphedema  pumps, he does not have custom fit compression wrap/stockings. He is diabetic and his recent A1c last month was 7.6. He admits to chronic bilateral lower extremity pain, no change in pain since blister and ulceration development. He is currently being treated with Levaquin for bronchitis. He has home health and we will continue. 10/25/17-He is here in follow-up evaluation for bilateral lower extremity ulcerationssubtle he remains on Levaquin for bronchitis. Right lower extremity with no evidence of drainage or ulceration, persistent left lower extremity ulceration. He states that home health has not been out since his appointment. He went to [facility] on Tuesday, studies revealed: RIGHT ABI 0.9, TBI 0.6 LEFT ABI 1.1, TBI 0.6 with triphasic flow bilaterally. We will continue his same treatment plan. He has been educated on compression therapy and need for elevation. He will benefit from lymphedema pumps 11/01/17-He is here in follow-up evaluation for left lower extremity ulcer. The right lower extremity remains healed. He has home health services, but they have not been out to see the patient for 2-3 weeks. He states it home health physical therapy changed his dressing yesterday after therapy; he placed Ace wrap compression. We are still waiting for lymphedema pumps, reordered d/t need for company change. 11/08/17-He is here in follow-up evaluation for left lower extremity ulcer. It is improved. Edema is significantly improved with compression therapy. We will continue with same treatment plan and he will follow-up next week. No word regarding lymphedema pumps 11/15/17-He is here in follow-up evaluation for left lower extremity ulcer. He is healed and will be discharged from wound care services. I have reached out to [facility] regarding his lymphedema pumps. They have been unable to reach the patient; the contact number they had with the patient's wife's cell phone and she has not  answered any unrecognized calls. Contact should be made today, trial planned  for next week; [facility] will continue to follow 11/27/17 on evaluation today patient has multiple blistered areas over the right lower extremity his left lower extremity appears to be doing okay. These blistered areas show signs of no infection which is great news. With that being said he did have some necrotic skin overlying which was mechanically debrided away with saline and gauze today without complication. Overall post debridement the wounds appear to be doing better but in general his swelling seems to be increased. This is obviously not good news. I think this is what has given rise to the blisters. 12/04/17 on evaluation today patient presents for follow-up concerning his bilateral lower extremity edema in the right lower extremity ulcers. He has been tolerating the dressing changes without complication. With that being said he has had no real issues with the wraps which is also good news. Overall I'm pleased with the progress he's been making. 12/11/17 on evaluation today patient appears to be doing rather well in regard to his right lateral lower extremity ulcer. He's been tolerating the dressing changes without complication. Fortunately there does not appear to be any evidence of infection at this time. Overall I'm pleased with the progress that is being made. Unfortunately he has been in the hospital due to having what sounds to be a stomach virus/flu fortunately that is starting to get better. 12/18/17 on evaluation today patient actually appears to be doing very well in regard to his bilateral lower extremities the swelling is under fairly good control his lymphedema pumps are still not up and running quite yet. With that being said he does have several areas of opening noted as far as wounds are concerned mainly over the left lower extremity. With that being said I do believe once he gets lymphedema pumps  this would least help you mention some the fluid and preventing this from occurring. Hopefully that will be set up soon sleeves are Artie in place at his home he just waiting for the machine. 12/25/17 on evaluation today patient actually appears to be doing excellent in fact all of his ulcers appear to have resolved his legs appear very well. I do think he needs compression stockings we have discussed this and they are actually going to go to [facility] today to elastic therapy to get this fitted for him. I think that is definitely a good thing to do. Readmission: 04/09/18 upon evaluation today this patient is seen for readmission due to bilateral lower extremity lymphedema. He has significant swelling of his extremities especially on the left although the right is also swollen he has weeping from both sides. There are no obvious open wounds at this point. Fortunately he has been doing fairly well for quite a bit of time since I last saw him. Nonetheless unfortunately this seems to have reopened and is giving quite a bit of trouble. He states this began about a week ago when he first called [place] to get in to be seen. No fevers, chills, nausea, or vomiting noted at this time. He has not been using his lymphedema pumps due to the fact that they won't fit on his leg at this point likewise is also not been using his compression for essentially the same reason. 04/16/18 upon evaluation today patient actually appears to be doing a little better in regard to the fluid in his bilateral lower extremities. With that being said he's had three falls since I saw him last week. He also states that he's been feeling  very poorly. I was concerned last week and feel like that the concern is still there as far as the congestion in his chest is concerned he seems to be breathing about the same as last week but again he states he's very weak he's not even able to walk further than from the chair to the door. His wife had to  buy a wheelchair just to be able to get them out of the house to get to the appointment today. This has me very concerned. 04/23/18 on evaluation today patient actually appears to be doing much better than last week's evaluation. At that time actually had to transport him to the ER via EMS and he subsequently was admitted for acute pulmonary edema, acute renal failure, and acute congestive heart failure. Fortunately he is doing much better. Apparently they did dialyze him and were able to take off roughly 35 pounds of fluid. Nonetheless he is feeling much better both in regard to his breathing and he's able to get around much better at this time compared to previous. Overall I'm very MANDY, FITZWATER ([id number]) happy with how things are at this time. There does not appear to be any evidence of infection currently. No fevers, chills, nausea, or vomiting noted at this time. 04/30/2018 patient seen today for follow-up and management of bilateral lower extremity lymphedema. He did express being more sad today than usual due to the recent loss of his dog. He states that he has been compliant with using the lymphedema pumps. However he does admit a minute over the last 2-3 days he has not been using the pumps due to the recent loss of his dog. At this time there is no drainage or open wounds to his lower extremities. The left leg edema is measuring smaller today. Still has a significant amount of edema on bilateral lower extremities With dry flaky skin. He will be referred to the lymphedema clinic for further management. Will continue 3 layer compression wraps and follow-up in 1-2 weeks.Denies any pain, fever, chairs recently. No recent falls or injuries reported during this visit. 05/07/18 on evaluation today patient actually appears to be doing very well in regard to his lower extremities in general all things considered. With that being said he is having some pain in the legs just due to the amount of  swelling. He does have an area where he had a blister on the left lateral lower extremity this is open at this point other than that there's nothing else weeping at this time. 05/14/18 on evaluation today patient actually appears to be doing excellent all things considered in regard to his lower extremities. He still has a couple areas of weeping on each leg which has continued to be the issue for him. He does have an appointment with the lymphedema clinic although this isn't until February 2020. That was the earliest they had. In the meantime he has continued to tolerate the compression wraps without complication. 05/28/18 on evaluation today patient actually appears to be doing more poorly in regard to his left lower extremity where he has a wound open at this point. He also had a fall where he subsequently injured his right great toe which has led to an open wound at the site unfortunately. He has been tolerating the dressing changes without complication in general as far as the wraps are concerned that he has not been putting any dressing on the left 1st toe ulcer site. 06/11/18 on evaluation today patient appears to  be doing much worse in regard to his bilateral lower extremity ulcers. He has been tolerating the dressing changes without complication although his legs have not been wrapped more recently. Overall I am not very pleased with the way his legs appear. I do believe he needs to be back in compression wraps he still has not received his compression wraps from the [facility] hospital as of yet. 06/18/18 on evaluation today patient actually appears to be doing significantly better than last time I saw him. He has been tolerating the compression wraps without complication in the circumferential ulcers especially appear to be doing much better. His toe ulcer on the right in regard to the great toe is better although not as good as the legs in my pinion. No fevers chills noted 07/02/18 on evaluation  today patient appears to be doing much better in regard to his lower extremity ulcers. Unfortunately since I last saw him he's had the distal portion of his right great toe if he dated it sounds as if this actually went downhill very quickly. I had only seen him a few days prior and the toe did not appear to be infected at that point subsequently became infected very rapidly and it was decided by the surgeon that the distal portion of the toe needed to be removed. The patient seems to be doing well in this regard he tells me. With that being said his lower extremities are doing better from the standpoint of the wounds although he is significantly swollen at this point. 07/09/18 on evaluation today patient appears to be doing better in regard to the wounds on his lower extremities. In fact everything is almost completely healed he is just a small area on the left posterior lower extremity that is open at this point. 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 With that being said he needs some kind of compression it does sound like he may not be wearing his compression, that is the wraps, during the entire time between when he's here visit to visit. Apparently his wife took the current one off because it began to "fall apart". 07/16/18 on evaluation today patient appears to be extremely swollen especially in regard to his left lower extremity unfortunately. He also has a new skin tear over the left lower extremity and there's a smaller area on the right lower extremity as well. Unfortunately this seems to be due in part to blistering and fluid buildup in his leg. He did get the reduction wraps that were ordered by the [facility] for him to go to lymphedema clinic. With that being said his wounds on the legs have not healed  to the point to where they would likely accept them as a patient lymphedema clinic currently. We need to try to get this to heal. With that being said he's been taking his wraps off which is not doing him any favors at this point. In fact this is probably quite counterproductive compared to what needs to occur. We will likely need to increase to a four layer compression wrap and continue to also utilize elevation and he has to keep the wraps on not take them off as he's been doing currently hasn't had a wrap on since Saturday. 07/23/18 on evaluation today patient appears to be doing much better in regard to  his bilateral lower extremities. In fact his left lower extremity which was the largest is actually 15 cm smaller today compared to what it was last time he was here in our clinic. This is obviously good news after just one week. Nonetheless the differences he actually kept the wraps on during the entire week this time. That's not typical for him. I do believe he understands a little bit better now the severity of the situation and why it's important for him to keep these wraps on. 07/30/18 on evaluation today patient actually appears to be doing rather well in regard to his lower extremities. His legs are much smaller than they have been in the past and he actually has only one very small rudder superficial region remaining that is not closed on the left lateral/posterior lower extremity even this is almost completely close. I do believe likely next week he will be healed without any complications. I do think we need to continue the wraps however this seems to be beneficial for him. I also think it may be a good time for [place] to go ahead and see about getting the appointment with the lymphedema clinic which is supposed to be made for him in order to keep this moving along and hopefully get them into compression wraps that will in the end help him to remain healed. 08/06/18 on evaluation today patient  actually appears to be doing very well in regard as bilateral lower extremities. In fact his wounds appear to be completely healed at this time. 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 He is in the process of getting appointment with the lymphedema clinic we have made this referral were just waiting to hear back on the schedule time. We need to follow up on that today as well. 08/13/18 on evaluation today patient actually appears to be doing very well in regard to his bilateral lower extremities there are no open wounds at this point. We are gonna go ahead and see about ordering the Velcro compression wraps for him had a discussion with them about [place] doing it versus the [place] they feel like they can definitely afford going ahead and get the wraps themselves and they would prefer to try to avoid having to go to the lymphedema clinic if it all possible which I completely understand. As long as he has good compression I'm okay either way. 3/17//20 on evaluation today patient actually appears to be doing well in regard to his bilateral lower extremity ulcers. He has been tolerating the dressing changes without complication specifically the compression wraps. Overall is had no issues my fingers finding that I see at this point is that he is having trouble with constipation. He tells me he has not been able to go to the bathroom for about six days. He's taken to over-the- counter oral laxatives unfortunately this is not helping. He has not contacted his doctor. RANKIN, COOLMAN ([id number]) 08/27/18 on evaluation today patient appears to be doing fairly well in regard to his lower extremities at this point. There does not appear to be any new altars is swelling is very well controlled. We are still waiting for his Velcro compression wraps to arrive that should be sometime in the next week is Artie sent the check for these. 09/03/18 on evaluation today  patient appears to be doing excellent in regard to his bilateral lower extremities which he shows no signs of wound openings in regard to  this point. He does have his Velcro compression wraps which did arrive in the mail since I saw him last week. Overall he is doing excellent in my pinion. 09/13/18 on evaluation today patient appears to be doing very well currently in regard to the overall appearance of his bilateral lower extremities although he's a little bit more swollen than last time we saw him. At that point he been discharged without any open wounds. Nonetheless he has a small open wound on the posterior left lower extremity with some evidence of cellulitis noted as well. Fortunately I feel like he has made good progress overall with regard to his lower extremities from were things used to be. 09/20/18 on evaluation today patient actually appears to be doing much better. The erythematous lower extremity is improving wound itself which is still open appears to be doing much better as far as but appearance as well as pain is concerned overall very pleased in this regard. There's no signs of active infection at this time. 09/27/18 on evaluation today patient's wounds on the lower extremity actually appear to be doing fairly well at this time which is good news. There is no evidence of active infection currently and again is just as left lower extremity were there any wounds at this point anyway. I believe they may be completely healed but again I'm not 100% sure based on evaluation today. I think one more week of observation would likely be a good idea. 10/04/18 on evaluation today patient actually appears to be doing excellent in regard to the left lower extremity which actually appears to be completely healed as of today. Unfortunately he's been having issues with his right lower extremity have a new wound that has opened. Fortunately there's no evidence of active infection at this time which is  good news. 10/10/18 upon evaluation today patient actually appears to be doing a little bit worse with the new open area on his right posterior lower extremity. He's been tolerating the dressing changes without complication. Right now we been using his compression wraps although I think we may need to switch back to actually performing bilateral compression wraps are in the clinic. No fevers, chills, nausea, or vomiting noted at this time. 10/17/18 on evaluation today patient actually appears to be doing quite well in regard to his bilateral lower extremity ulcers. He has been tolerating the reps without complication although he would prefer not to rewrap his legs as of today. Fortunately there's no signs of active infection which is good news. No fevers, chills, nausea, or vomiting noted at this time. 10/24/18 on evaluation today patient appears to be doing very well in regard to his lower extremities. His right lower extremity is shown signs of healing and his left lower Trinity though not healed appears to be improving which is excellent news. Overall very pleased with how things seem to be progressing at this time. The patient is likewise happy to hear this. His Velcro compression wraps however have not been put on properly will gonna show his wife how to do that properly today. 10/31/18 on evaluation today patient appears to be doing more poorly in regard to his left lower extremity in particular although both lower extremities actually are showing some signs of being worse than my previous evaluation. Unfortunately I'm just not sure that his compression stockings even with the use of the compression/lymphedema pumps seem to be controlling this well. Upon further questioning he tells me that he also is not able to  lie flat in the bed due to his congestive heart failure and difficulty breathing. For that reason he sleeps in his recliner. To make matters worse his recliner also cannot even hold his legs  up so instead of even being somewhat elevated they pretty much hang down to the floor. This is the way he sleeps each night which is definitely counterproductive to everything else that were attempting to do from the standpoint of controlling his fluid. Nonetheless I think that he potentially could benefit from a hospital bed although this would be something that his primary care provider would likely have to order since anything that is order on our side has to be directly related to wound care and again the hospital bed is not necessarily a direct relation to although I think it does contribute to his overall wound status on his lower extremities. 11/07/18 on evaluation today patient appears to be doing better in regard to his bilateral lower extremity. He's been tolerating the dressing changes without complication. We did in the interim since I last saw him switch to just using extras orbiting new alginate and that seems to have done much better for him. I'm very pleased with the overall progress that is made. 11/14/18 on evaluation today patient appears to be redoing rather well in regard to his left lower extremity ulcers which are the only ones remaining at this point. Fortunately there's no signs of active infection at this time which is good news. Overall been very pleased with how things seem to be progressing currently. No fevers, chills, nausea, or vomiting noted at this time. 11/20/17 on evaluation today patient actually appears to be doing quite well in regard to his lower extremities on the left on the right he has several blisters that showed up although there's some question about whether or not he has had his broker compression wraps on like he was supposed to or not. Fortunately there's no signs of active infection at this time which is good news. Unfortunately though he is doing well from the standpoint of the left leg the right leg is not doing as well again this is when we did not wrap  last week. 11/28/18 on evaluation today patient appears to be doing well in regard to his bilateral lower extremities is left appears to be healed is right is not healed but is very close to being so. Overall very pleased with how things seem to be progressing. Patient is likewise happy that things are doing well. 12/09/18 on evaluation today patient actually appears to be completely healed which is excellent news. He actually seems to be doing well in regard to the swelling of the bilateral lower extremities which is also great news. Overall very pleased with how things seem to be progressing. Readmission: 03/18/2019 upon evaluation today patient presents for reevaluation concerning issues that he is having with his left lower extremity where he does have a wound noted upon inspection today. He also has a wound on the right second toe on the tip where it is apparently been rubbing in his shoe I did look at issues and you can actually see where this has been occurring as well. Fortunately there is no signs of infection with regard to the toe necessarily although it is very swollen compared to normal that does have me somewhat concerned about the possibility of further evaluation for infection/osteomyelitis. I would recommend an x-ray to start with. Otherwise he states that it is been 1-2 weeks that he has  had the draining in regard to left lower extremity he does not know how this happened he has been wearing his compression appropriately and I do see that as well today but nonetheless I do think that he needs to continue to be very cautious with regard to elevation as well. AAYAN, HASKEW ([id number]) 03/25/2019 on evaluation today patient appears to be doing much better in regard to his lower extremity at this time. That is on the left. He did culture positive for Pseudomonas but the good news is he seems to be doing much better the gentamicin we applied topically seems to have done a great job for  him. There is no signs of active infection at this time systemically and locally he is doing much better. No fevers, chills, nausea, vomiting, or diarrhea. In regard to his right toe this seems to be doing much better there is very little pressure noted at this point I did clean up some of the callus today around the edges of the wound as well as the surface of the wound but to be honest he is progressing quite nicely. 10/30; the area on the left anterior lower tibia area is healed over. His edema control is adequate even though his use of the compression pumps seems very intermittent. He has a juxta lite stocking on the right leg and a think there is 1 for the left leg at home. His wife is putting these on. He has an area on the plantar right second toe which is a hammertoe they are trying to offload this 04/08/2019 on evaluation today patient appears to be doing well in regard to his lower extremities which is good news. We are seeing him today for his toe ulcer which is giving him a little bit more trouble but again still seems to be doing better at this point which is good news. He is going require some sharp debridement in regard to the toe today however. 04/15/2019 on evaluation today patient actually appears to be doing quite well with regard to his toe ulcer. I am very pleased with how things are doing in that regard. With regard to his lower extremity edema in general he tells me he is not been using the lymphedema pumps regularly although he does not use them. I think that he needs to use them more regularly he does have a small blister on the left lower extremity this is not open at this point but obviously it something that can get worse if he does not keep the compression going and the pumps going as well. 04/22/2019 on evaluation today patient appears to be doing well with regard to his toe ulcer. He has been tolerating the dressing changes without complication. This is measuring slightly  smaller this week as compared to last week. Fortunately there is no signs of active infection at this time. 05/06/2019 on evaluation today patient actually appears to be doing excellent. In fact his toe appears to be completely healed which is great news. There is no signs of active infection at this time. Readmission: Patient presents today for follow-up after having been discharged at the beginning of December 2020. He states that in recent weeks things have reopened and has been having more trouble at this point. With that being said fortunately there is no signs of active infection at this time. No fever chills noted. Nonetheless he also does have an issue with one of his toes as well that seems to be somewhat this is  on the right foot second toe. 07/25/2019 upon evaluation today patient's lower extremities appear to be doing excellent bilaterally. I really feel like he is showing signs of great improvement and in fact I think he is close to healing which is great news. There is no signs of active infection at this time. No fevers, chills, nausea, vomiting, or diarrhea. 07/31/2019 upon evaluation today patient appears to be doing excellent and in fact I think may be completely healed in regard to his right lower extremity that were still to monitor this 1 more week before closing it out. The left lower extremity is measuring smaller although not completely closed seems to be doing excellent. 08/07/2019 upon evaluation today patient appears to be doing excellent in regard to his wounds. In fact the right lower extremity is completely healed there is no issues here. The left lower extremity has 1 very small area still remaining fortunately there is no signs of infection and overall I think he is very close to closure of the here as well. 08/14/19 upon evaluation today patient appears to be doing excellent in regard to his lower extremities. In fact he appears to be completely healed as of today.  Fortunately there's no signs of active infection at this time. No fevers, chills, nausea, or vomiting noted at this time. READMISSION 09/26/2019 This is a 73 year old man who is well-known to this clinic having just been discharged on 08/14/2019. He has known lymphedema and chronic venous insufficiency. He has Farrow wrap stockings and also external compression pumps. He does not use the external compression pumps however. He does however apparently fairly faithfully use the stockings. They have not been lotion in his legs. He has developed new wounds on the right anterior as well as the left medial left lateral and left posterior calf. He returns for our review of this Past medical history includes coronary artery disease, hypertension, congestive heart failure, COPD, venous insufficiency with lymphedema, type 2 diabetes. He apparently has compression pumps, Farrow wraps and at 1 point a hospital bed although I believe he sleeps in a recliner 10/03/2019 upon evaluation today patient appears to be doing better with regard to his wounds in general. He has been tolerating the dressing changes without complication. Fortunately there is no signs of active infection. No fevers, chills, nausea, vomiting, or diarrhea. I do believe the compression wraps are helping as they always have in the past. 10/10/2019 upon evaluation today patient appears to be doing excellent at this point. His right lower extremity is measuring much better and in fact is completely healed. His left lower extremity is also measuring better though not healed seems to be doing excellent at this point which is great news. Overall I am very pleased with how things appear currently. 10/27/2019 upon evaluation today patient appears to be doing quite well with regard to his wounds currently. He has been tolerating the dressing changes without complication. Fortunately there is no signs of active infection at this time. In fact he is almost  completely healed on the left and has just a very small area open and on the right he is completely healed. 11/04/2019 upon evaluation today patient appears to be doing quite well with regard to his wounds. In fact he appears to be quite possibly completely healed on the left lower extremity now as well the right lower extremity is still doing well. With that being said unfortunately though this may be healed I think it is a very fragile area and I  cannot even confirm there is not a very small opening still remaining. I really think he may benefit from 1 additional weeks wrapping before discontinuing. 11/10/2019 upon evaluation today patient appears to be doing well in regard to his original wounds in fact everything that we were treating last week is completely healed on the left. Unfortunately he has a new skin tear just above where the wrap slipped down due to a fall he sustained causing this injury. 11/17/2019 on evaluation today patient appears to be doing better with regard to his wound. He has been tolerating the dressing changes without GLENWOOD, REVOIR. ([id number]) complication. Fortunately there is no signs of active infection at this time. No fevers, chills, nausea, vomiting, or diarrhea. 11/24/2019 upon evaluation today patient appears to be doing well with regard to his wound. This is making good progress there does not appear to be signs of active infection but overall I do feel like he is headed in the correct direction. Overall he is tolerating the compression wrap as well. 12/02/2019 upon evaluation today patient appears to be doing well with regard to his leg ulcer. In fact this appears to be completely healed and this is the last of the wounds that he had but still the left anterior lower extremity. Fortunately there is no signs of active infection at this time. No fevers, chills, nausea, vomiting, or diarrhea. Electronic Signature(s) Signed: 12/02/2019 4:51:51 PM By: Worthy Keeler  PA-C Entered By: Worthy Keeler on 12/02/2019 16:51:51 Adam Merritt ([id number]) -------------------------------------------------------------------------------- Physical Exam Details Patient Name: Adam Merritt Date of Service: 12/02/2019 3:30 PM Medical Record Number: [id number] Patient Account Number: [id number] Date of Birth/Sex: 1945/08/05 (74 y.o. M) Treating RN: Cornell Barman Primary Care Provider: [name] Other Clinician: Referring Provider: [name] Treating Provider/Extender: Melburn Hake, Gal[place] Logie [name]: 9 Constitutional Obese and well-hydrated in no acute distress. Respiratory normal breathing without difficulty. Psychiatric this patient is able to make decisions and demonstrates good insight into disease process. Alert and Oriented x 3. pleasant and cooperative. Notes His wound bed currently showed signs of good granulation at this time there does not appear to be any evidence of active infection which is great news and overall very pleased with where things stand at this point. I do believe that the patient has his Velcro compression wraps and this should be appropriate for continuing to manage his edema. They do seem to fit a lot better than his old wraps to be honest. Electronic Signature(s) Signed: 12/02/2019 4:52:17 PM By: Worthy Keeler PA-C Entered By: Worthy Keeler on 12/02/2019 16:52:17 Adam Merritt ([id number]) -------------------------------------------------------------------------------- Physician Orders Details Patient Name: Adam Merritt Date of Service: 12/02/2019 3:30 PM Medical Record Number: [id number] Patient Account Number: [id number] Date of Birth/Sex: Jan 20, 1946 (74 y.o. M) Treating RN: Cornell Barman Primary Care Provider: [name] Other Clinician: Referring Provider: [name] Treating Provider/Extender: Melburn Hake, Shamel Germond [name]: 9 Verbal / Phone Orders: No Diagnosis Coding ICD-10 Coding Code Description I89.0  Lymphedema, not elsewhere classified L97.222 Non-pressure chronic ulcer of left calf with fat layer exposed L97.812 Non-pressure chronic ulcer of other part of right lower leg with fat layer exposed Edema Control o Patient to wear own Velcro compression garment. Discharge From [facility] o Discharge from [place] Signature(s) Signed: 12/02/2019 5:22:40 PM By: Worthy Keeler PA-C Signed: 12/02/2019 5:48:04 PM By: Gretta Cool, BSN, RN, CWS, Kim RN, BSN Entered By: Gretta Cool, BSN, RN, CWS, Kim on 12/02/2019 16:10:05  CAPONE, SCHWINN ([id number]) -------------------------------------------------------------------------------- Problem List Details Patient Name: KEMAR, PANDIT Date of Service: 12/02/2019 3:30 PM Medical Record Number: [id number] Patient Account Number: [id number] Date of Birth/Sex: 12-13-1945 (74 y.o. M) Treating RN: Cornell Barman Primary Care Provider: [name] Other Clinician: Referring Provider: [name] Treating Provider/Extender: Melburn Hake, Niki Payment [name]: 9 Active Problems ICD-10 Encounter Code Description Active Date MDM Diagnosis I89.0 Lymphedema, not elsewhere classified 09/26/2019 No Yes L97.222 Non-pressure chronic ulcer of left calf with fat layer exposed 09/26/2019 No Yes L97.812 Non-pressure chronic ulcer of other part of right lower leg with fat layer 09/26/2019 No Yes exposed Inactive Problems Resolved Problems Electronic Signature(s) Signed: 12/02/2019 3:54:45 PM By: Worthy Keeler PA-C Entered By: Worthy Keeler on 12/02/2019 15:54:45 Adam Merritt ([id number]) -------------------------------------------------------------------------------- Progress Note Details Patient Name: Adam Merritt Date of Service: 12/02/2019 3:30 PM Medical Record Number: [id number] Patient Account Number: [id number] Date of Birth/Sex: [birth year] (74 y.o. M) Treating RN: Cornell Barman Primary Care Provider: [name] Other Clinician: Referring  Provider: [name] Treating Provider/Extender: Melburn Hake, Elexus Barman [name]: 9 Subjective Chief Complaint Information obtained from Patient Bilateral LE Ulcers and right second toe ulcer 09/26/2019; patient is here for review bilateral lower leg wounds. He is well familiar to this clinic History of Present Illness (HPI) 10/18/17-He is here for initial evaluation of bilateral lower extremity ulcerations in the presence of venous insufficiency and lymphedema. He has been seen by vascular medicine in the past, Dr. Lucky Cowboy, last seen in 2016. He does have a history of abnormal ABIs, which is to be expected given his lymphedema and venous insufficiency. According to Epic, it appears that all attempts for arterial evaluation and/or angiography were not follow through with by patient. He does have a history of being seen in lymphedema clinic in 2018, stopped going approximately 6 months ago stating "it didn't do any good". He does not have lymphedema pumps, he does not have custom fit compression wrap/stockings. He is diabetic and his recent A1c last month was 7.6. He admits to chronic bilateral lower extremity pain, no change in pain since blister and ulceration development. He is currently being treated with Levaquin for bronchitis. He has home health and we will continue. 10/25/17-He is here in follow-up evaluation for bilateral lower extremity ulcerationssubtle he remains on Levaquin for bronchitis. Right lower extremity with no evidence of drainage or ulceration, persistent left lower extremity ulceration. He states that home health has not been out since his appointment. He went to [facility] on Tuesday, studies revealed: RIGHT ABI 0.9, TBI 0.6 LEFT ABI 1.1, TBI 0.6 with triphasic flow bilaterally. We will continue his same treatment plan. He has been educated on compression therapy and need for elevation. He will benefit from lymphedema pumps 11/01/17-He is here in follow-up  evaluation for left lower extremity ulcer. The right lower extremity remains healed. He has home health services, but they have not been out to see the patient for 2-3 weeks. He states it home health physical therapy changed his dressing yesterday after therapy; he placed Ace wrap compression. We are still waiting for lymphedema pumps, reordered d/t need for company change. 11/08/17-He is here in follow-up evaluation for left lower extremity ulcer. It is improved. Edema is significantly improved with compression therapy. We will continue with same treatment plan and he will follow-up next week. No word regarding lymphedema pumps 11/15/17-He is here in follow-up evaluation for left lower extremity ulcer. He is healed and will  be discharged from wound care services. I have reached out to [facility] regarding his lymphedema pumps. They have been unable to reach the patient; the contact number they had with the patient's wife's cell phone and she has not answered any unrecognized calls. Contact should be made today, trial planned for next week; [facility] will continue to follow 11/27/17 on evaluation today patient has multiple blistered areas over the right lower extremity his left lower extremity appears to be doing okay. These blistered areas show signs of no infection which is great news. With that being said he did have some necrotic skin overlying which was mechanically debrided away with saline and gauze today without complication. Overall post debridement the wounds appear to be doing better but in general his swelling seems to be increased. This is obviously not good news. I think this is what has given rise to the blisters. 12/04/17 on evaluation today patient presents for follow-up concerning his bilateral lower extremity edema in the right lower extremity ulcers. He has been tolerating the dressing changes without complication. With that being said he has had no real issues with the  wraps which is also good news. Overall I'm pleased with the progress he's been making. 12/11/17 on evaluation today patient appears to be doing rather well in regard to his right lateral lower extremity ulcer. He's been tolerating the dressing changes without complication. Fortunately there does not appear to be any evidence of infection at this time. Overall I'm pleased with the progress that is being made. Unfortunately he has been in the hospital due to having what sounds to be a stomach virus/flu fortunately that is starting to get better. 12/18/17 on evaluation today patient actually appears to be doing very well in regard to his bilateral lower extremities the swelling is under fairly good control his lymphedema pumps are still not up and running quite yet. With that being said he does have several areas of opening noted as far as wounds are concerned mainly over the left lower extremity. With that being said I do believe once he gets lymphedema pumps this would least help you mention some the fluid and preventing this from occurring. Hopefully that will be set up soon sleeves are Artie in place at his home he just waiting for the machine. 12/25/17 on evaluation today patient actually appears to be doing excellent in fact all of his ulcers appear to have resolved his legs appear very well. I do think he needs compression stockings we have discussed this and they are actually going to go to [place] today to elastic therapy to get this fitted for him. I think that is definitely a good thing to do. Readmission: 04/09/18 upon evaluation today this patient is seen for readmission due to bilateral lower extremity lymphedema. He has significant swelling of his extremities especially on the left although the right is also swollen he has weeping from both sides. There are no obvious open wounds at this point. Fortunately he has been doing fairly well for quite a bit of time since I last saw him.  Nonetheless unfortunately this seems to have reopened and is giving quite a bit of trouble. He states this began about a week ago when he first called [place] to get in to be seen. No fevers, chills, nausea, or vomiting noted at this time. He has not been using his lymphedema pumps due to the fact that they won't fit on his leg at this point likewise is also not been  using his compression for essentially the same reason. 04/16/18 upon evaluation today patient actually appears to be doing a little better in regard to the fluid in his bilateral lower extremities. With that being said he's had three falls since I saw him last week. He also states that he's been feeling very poorly. I was concerned last week and feel like that the concern is still there as far as the congestion in his chest is concerned he seems to be breathing about the same as last week but again he states he's very weak he's not even able to walk further than from the chair to the door. His wife had to buy a wheelchair just to be able to get them out of the house to get to the appointment today. This has me very concerned. JERMANY, RIMEL ([id number]) 04/23/18 on evaluation today patient actually appears to be doing much better than last week's evaluation. At that time actually had to transport him to the ER via EMS and he subsequently was admitted for acute pulmonary edema, acute renal failure, and acute congestive heart failure. Fortunately he is doing much better. Apparently they did dialyze him and were able to take off roughly 35 pounds of fluid. Nonetheless he is feeling much better both in regard to his breathing and he's able to get around much better at this time compared to previous. Overall I'm very happy with how things are at this time. There does not appear to be any evidence of infection currently. No fevers, chills, nausea, or vomiting noted at this time. 04/30/2018 patient seen today for follow-up and management of  bilateral lower extremity lymphedema. He did express being more sad today than usual due to the recent loss of his dog. He states that he has been compliant with using the lymphedema pumps. However he does admit a minute over the last 2-3 days he has not been using the pumps due to the recent loss of his dog. At this time there is no drainage or open wounds to his lower extremities. The left leg edema is measuring smaller today. Still has a significant amount of edema on bilateral lower extremities With dry flaky skin. He will be referred to the lymphedema clinic for further management. Will continue 3 layer compression wraps and follow-up in 1-2 weeks.Denies any pain, fever, chairs recently. No recent falls or injuries reported during this visit. 05/07/18 on evaluation today patient actually appears to be doing very well in regard to his lower extremities in general all things considered. With that being said he is having some pain in the legs just due to the amount of swelling. He does have an area where he had a blister on the left lateral lower extremity this is open at this point other than that there's nothing else weeping at this time. 05/14/18 on evaluation today patient actually appears to be doing excellent all things considered in regard to his lower extremities. He still has a couple areas of weeping on each leg which has continued to be the issue for him. He does have an appointment with the lymphedema clinic although this isn't until February 2020. That was the earliest they had. In the meantime he has continued to tolerate the compression wraps without complication. 05/28/18 on evaluation today patient actually appears to be doing more poorly in regard to his left lower extremity where he has a wound open at this point. He also had a fall where he subsequently injured his right great toe  which has led to an open wound at the site unfortunately. He has been tolerating the dressing changes  without complication in general as far as the wraps are concerned that he has not been putting any dressing on the left 1st toe ulcer site. 06/11/18 on evaluation today patient appears to be doing much worse in regard to his bilateral lower extremity ulcers. He has been tolerating the dressing changes without complication although his legs have not been wrapped more recently. Overall I am not very pleased with the way his legs appear. I do believe he needs to be back in compression wraps he still has not received his compression wraps from the [facility] hospital as of yet. 06/18/18 on evaluation today patient actually appears to be doing significantly better than last time I saw him. He has been tolerating the compression wraps without complication in the circumferential ulcers especially appear to be doing much better. His toe ulcer on the right in regard to the great toe is better although not as good as the legs in my pinion. No fevers chills noted 07/02/18 on evaluation today patient appears to be doing much better in regard to his lower extremity ulcers. Unfortunately since I last saw him he's had the distal portion of his right great toe if he dated it sounds as if this actually went downhill very quickly. I had only seen him a few days prior and the toe did not appear to be infected at that point subsequently became infected very rapidly and it was decided by the surgeon that the distal portion of the toe needed to be removed. The patient seems to be doing well in this regard he tells me. With that being said his lower extremities are doing better from the standpoint of the wounds although he is significantly swollen at this point. 07/09/18 on evaluation today patient appears to be doing better in regard to the wounds on his lower extremities. In fact everything is almost completely healed he is just a small area on the left posterior lower extremity that is open at this point. 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 With that being said he needs some kind of compression it does sound like he may not be wearing his compression, that is the wraps, during the entire time between when he's here visit to visit. Apparently his wife took the current one off because it began to "fall apart". 07/16/18 on evaluation today patient appears to be extremely swollen especially in regard to his left lower extremity unfortunately. He also has a new skin tear over the left lower extremity and there's a smaller area on the right lower extremity as well. Unfortunately this seems to be due in part to blistering and fluid buildup in his leg. He did get the reduction wraps that were ordered by the [facility] hospital for him to go to lymphedema clinic. With that being said his wounds on the legs have not healed to the point to where they would likely accept them as a patient lymphedema clinic currently. We need to try to get this to heal. With that being said he's been taking his wraps off which is not doing him any favors at this point. In fact this is probably quite counterproductive compared to what needs to occur. We will likely need to  increase to a four layer compression wrap and continue to also utilize elevation and he has to keep the wraps on not take them off as he's been doing currently hasn't had a wrap on since Saturday. 07/23/18 on evaluation today patient appears to be doing much better in regard to his bilateral lower extremities. In fact his left lower extremity which was the largest is actually 15 cm smaller today compared to what it was last time he was here in our clinic. This is obviously good news after just one week. Nonetheless the differences he actually kept the wraps on during the entire week this time. That's not  typical for him. I do believe he understands a little bit better now the severity of the situation and why it's important for him to keep these wraps on. 07/30/18 on evaluation today patient actually appears to be doing rather well in regard to his lower extremities. His legs are much smaller than they have been in the past and he actually has only one very small rudder superficial region remaining that is not closed on the left lateral/posterior lower extremity even this is almost completely close. I do believe likely next week he will be healed without any complications. I do think we need to continue the wraps however this seems to be beneficial for him. I also think it may be a good time for [place] to go ahead and see about getting the appointment with the lymphedema clinic which is supposed to be made for him in order to keep this moving along and hopefully get them into compression wraps that will in the end help him to remain healed. 08/06/18 on evaluation today patient actually appears to be doing very well in regard as bilateral lower extremities. In fact his wounds appear to be completely healed at this time. 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 He is in the process of getting appointment with the lymphedema clinic we have made this referral were just waiting to hear back on the schedule time. We need to follow up on that today as well. 08/13/18 on evaluation today patient actually appears to be doing very well in regard to his bilateral lower extremities there are no open wounds at this point. We are gonna go ahead and see about ordering the Velcro compression wraps for him had a discussion with them about [place] doing it versus the [place] they feel like they can definitely afford going ahead and get the wraps themselves and they would prefer to try to avoid having to go to the lymphedema clinic if it all possible which I completely  understand. As long as he has good compression I'm okay either way. NAYSHAWN, MESTA ([id number]) 3/17//20 on evaluation today patient actually appears to be doing well in regard to his bilateral lower extremity ulcers. He has been tolerating the dressing changes without complication specifically the compression wraps. Overall is had no issues my fingers finding that I see at this point is that he is having trouble with constipation. He tells me he has not been able to go to the bathroom for about six days. He's taken to over-the- counter oral laxatives unfortunately this is not helping. He has not contacted his doctor. 08/27/18 on evaluation today patient appears to be doing fairly well in regard to his lower extremities at this point. There does not appear to be any new altars is swelling is very well controlled. We are still  waiting for his Velcro compression wraps to arrive that should be sometime in the next week is Artie sent the check for these. 09/03/18 on evaluation today patient appears to be doing excellent in regard to his bilateral lower extremities which he shows no signs of wound openings in regard to this point. He does have his Velcro compression wraps which did arrive in the mail since I saw him last week. Overall he is doing excellent in my pinion. 09/13/18 on evaluation today patient appears to be doing very well currently in regard to the overall appearance of his bilateral lower extremities although he's a little bit more swollen than last time we saw him. At that point he been discharged without any open wounds. Nonetheless he has a small open wound on the posterior left lower extremity with some evidence of cellulitis noted as well. Fortunately I feel like he has made good progress overall with regard to his lower extremities from were things used to be. 09/20/18 on evaluation today patient actually appears to be doing much better. The erythematous lower extremity is improving wound  itself which is still open appears to be doing much better as far as but appearance as well as pain is concerned overall very pleased in this regard. There's no signs of active infection at this time. 09/27/18 on evaluation today patient's wounds on the lower extremity actually appear to be doing fairly well at this time which is good news. There is no evidence of active infection currently and again is just as left lower extremity were there any wounds at this point anyway. I believe they may be completely healed but again I'm not 100% sure based on evaluation today. I think one more week of observation would likely be a good idea. 10/04/18 on evaluation today patient actually appears to be doing excellent in regard to the left lower extremity which actually appears to be completely healed as of today. Unfortunately he's been having issues with his right lower extremity have a new wound that has opened. Fortunately there's no evidence of active infection at this time which is good news. 10/10/18 upon evaluation today patient actually appears to be doing a little bit worse with the new open area on his right posterior lower extremity. He's been tolerating the dressing changes without complication. Right now we been using his compression wraps although I think we may need to switch back to actually performing bilateral compression wraps are in the clinic. No fevers, chills, nausea, or vomiting noted at this time. 10/17/18 on evaluation today patient actually appears to be doing quite well in regard to his bilateral lower extremity ulcers. He has been tolerating the reps without complication although he would prefer not to rewrap his legs as of today. Fortunately there's no signs of active infection which is good news. No fevers, chills, nausea, or vomiting noted at this time. 10/24/18 on evaluation today patient appears to be doing very well in regard to his lower extremities. His right lower extremity is  shown signs of healing and his left lower Trinity though not healed appears to be improving which is excellent news. Overall very pleased with how things seem to be progressing at this time. The patient is likewise happy to hear this. His Velcro compression wraps however have not been put on properly will gonna show his wife how to do that properly today. 10/31/18 on evaluation today patient appears to be doing more poorly in regard to his left lower extremity in particular  although both lower extremities actually are showing some signs of being worse than my previous evaluation. Unfortunately I'm just not sure that his compression stockings even with the use of the compression/lymphedema pumps seem to be controlling this well. Upon further questioning he tells me that he also is not able to lie flat in the bed due to his congestive heart failure and difficulty breathing. For that reason he sleeps in his recliner. To make matters worse his recliner also cannot even hold his legs up so instead of even being somewhat elevated they pretty much hang down to the floor. This is the way he sleeps each night which is definitely counterproductive to everything else that were attempting to do from the standpoint of controlling his fluid. Nonetheless I think that he potentially could benefit from a hospital bed although this would be something that his primary care provider would likely have to order since anything that is order on our side has to be directly related to wound care and again the hospital bed is not necessarily a direct relation to although I think it does contribute to his overall wound status on his lower extremities. 11/07/18 on evaluation today patient appears to be doing better in regard to his bilateral lower extremity. He's been tolerating the dressing changes without complication. We did in the interim since I last saw him switch to just using extras orbiting new alginate and that seems to  have done much better for him. I'm very pleased with the overall progress that is made. 11/14/18 on evaluation today patient appears to be redoing rather well in regard to his left lower extremity ulcers which are the only ones remaining at this point. Fortunately there's no signs of active infection at this time which is good news. Overall been very pleased with how things seem to be progressing currently. No fevers, chills, nausea, or vomiting noted at this time. 11/20/17 on evaluation today patient actually appears to be doing quite well in regard to his lower extremities on the left on the right he has several blisters that showed up although there's some question about whether or not he has had his broker compression wraps on like he was supposed to or not. Fortunately there's no signs of active infection at this time which is good news. Unfortunately though he is doing well from the standpoint of the left leg the right leg is not doing as well again this is when we did not wrap last week. 11/28/18 on evaluation today patient appears to be doing well in regard to his bilateral lower extremities is left appears to be healed is right is not healed but is very close to being so. Overall very pleased with how things seem to be progressing. Patient is likewise happy that things are doing well. 12/09/18 on evaluation today patient actually appears to be completely healed which is excellent news. He actually seems to be doing well in regard to the swelling of the bilateral lower extremities which is also great news. Overall very pleased with how things seem to be progressing. Readmission: 03/18/2019 upon evaluation today patient presents for reevaluation concerning issues that he is having with his left lower extremity where he does have a wound noted upon inspection today. He also has a wound on the right second toe on the tip where it is apparently been rubbing in his shoe I did look at issues and you  can actually see where this has been occurring as well. Fortunately there is  no signs of infection with regard to AUGIE, VANE. ([id number]) the toe necessarily although it is very swollen compared to normal that does have me somewhat concerned about the possibility of further evaluation for infection/osteomyelitis. I would recommend an x-ray to start with. Otherwise he states that it is been 1-2 weeks that he has had the draining in regard to left lower extremity he does not know how this happened he has been wearing his compression appropriately and I do see that as well today but nonetheless I do think that he needs to continue to be very cautious with regard to elevation as well. 03/25/2019 on evaluation today patient appears to be doing much better in regard to his lower extremity at this time. That is on the left. He did culture positive for Pseudomonas but the good news is he seems to be doing much better the gentamicin we applied topically seems to have done a great job for him. There is no signs of active infection at this time systemically and locally he is doing much better. No fevers, chills, nausea, vomiting, or diarrhea. In regard to his right toe this seems to be doing much better there is very little pressure noted at this point I did clean up some of the callus today around the edges of the wound as well as the surface of the wound but to be honest he is progressing quite nicely. 10/30; the area on the left anterior lower tibia area is healed over. His edema control is adequate even though his use of the compression pumps seems very intermittent. He has a juxta lite stocking on the right leg and a think there is 1 for the left leg at home. His wife is putting these on. He has an area on the plantar right second toe which is a hammertoe they are trying to offload this 04/08/2019 on evaluation today patient appears to be doing well in regard to his lower extremities which is good news.  We are seeing him today for his toe ulcer which is giving him a little bit more trouble but again still seems to be doing better at this point which is good news. He is going require some sharp debridement in regard to the toe today however. 04/15/2019 on evaluation today patient actually appears to be doing quite well with regard to his toe ulcer. I am very pleased with how things are doing in that regard. With regard to his lower extremity edema in general he tells me he is not been using the lymphedema pumps regularly although he does not use them. I think that he needs to use them more regularly he does have a small blister on the left lower extremity this is not open at this point but obviously it something that can get worse if he does not keep the compression going and the pumps going as well. 04/22/2019 on evaluation today patient appears to be doing well with regard to his toe ulcer. He has been tolerating the dressing changes without complication. This is measuring slightly smaller this week as compared to last week. Fortunately there is no signs of active infection at this time. 05/06/2019 on evaluation today patient actually appears to be doing excellent. In fact his toe appears to be completely healed which is great news. There is no signs of active infection at this time. Readmission: Patient presents today for follow-up after having been discharged at the beginning of December 2020. He states that in recent weeks things  have reopened and has been having more trouble at this point. With that being said fortunately there is no signs of active infection at this time. No fever chills noted. Nonetheless he also does have an issue with one of his toes as well that seems to be somewhat this is on the right foot second toe. 07/25/2019 upon evaluation today patient's lower extremities appear to be doing excellent bilaterally. I really feel like he is showing signs of great improvement and in  fact I think he is close to healing which is great news. There is no signs of active infection at this time. No fevers, chills, nausea, vomiting, or diarrhea. 07/31/2019 upon evaluation today patient appears to be doing excellent and in fact I think may be completely healed in regard to his right lower extremity that were still to monitor this 1 more week before closing it out. The left lower extremity is measuring smaller although not completely closed seems to be doing excellent. 08/07/2019 upon evaluation today patient appears to be doing excellent in regard to his wounds. In fact the right lower extremity is completely healed there is no issues here. The left lower extremity has 1 very small area still remaining fortunately there is no signs of infection and overall I think he is very close to closure of the here as well. 08/14/19 upon evaluation today patient appears to be doing excellent in regard to his lower extremities. In fact he appears to be completely healed as of today. Fortunately there's no signs of active infection at this time. No fevers, chills, nausea, or vomiting noted at this time. READMISSION 09/26/2019 This is a 74 year old man who is well-known to this clinic having just been discharged on 08/14/2019. He has known lymphedema and chronic venous insufficiency. He has Farrow wrap stockings and also external compression pumps. He does not use the external compression pumps however. He does however apparently fairly faithfully use the stockings. They have not been lotion in his legs. He has developed new wounds on the right anterior as well as the left medial left lateral and left posterior calf. He returns for our review of this Past medical history includes coronary artery disease, hypertension, congestive heart failure, COPD, venous insufficiency with lymphedema, type 2 diabetes. He apparently has compression pumps, Farrow wraps and at 1 point a hospital bed although I believe he  sleeps in a recliner 10/03/2019 upon evaluation today patient appears to be doing better with regard to his wounds in general. He has been tolerating the dressing changes without complication. Fortunately there is no signs of active infection. No fevers, chills, nausea, vomiting, or diarrhea. I do believe the compression wraps are helping as they always have in the past. 10/10/2019 upon evaluation today patient appears to be doing excellent at this point. His right lower extremity is measuring much better and in fact is completely healed. His left lower extremity is also measuring better though not healed seems to be doing excellent at this point which is great news. Overall I am very pleased with how things appear currently. 10/27/2019 upon evaluation today patient appears to be doing quite well with regard to his wounds currently. He has been tolerating the dressing changes without complication. Fortunately there is no signs of active infection at this time. In fact he is almost completely healed on the left and has just a very small area open and on the right he is completely healed. 11/04/2019 upon evaluation today patient appears to be doing quite well  with regard to his wounds. In fact he appears to be quite possibly completely healed on the left lower extremity now as well the right lower extremity is still doing well. With that being said unfortunately though this may be healed I think it is a very fragile area and I cannot even confirm there is not a very small opening still remaining. I really think he may benefit from 1 additional weeks wrapping before discontinuing. KEINAN, BROUILLET ([id number]) 11/10/2019 upon evaluation today patient appears to be doing well in regard to his original wounds in fact everything that we were treating last week is completely healed on the left. Unfortunately he has a new skin tear just above where the wrap slipped down due to a fall he sustained causing this  injury. 11/17/2019 on evaluation today patient appears to be doing better with regard to his wound. He has been tolerating the dressing changes without complication. Fortunately there is no signs of active infection at this time. No fevers, chills, nausea, vomiting, or diarrhea. 11/24/2019 upon evaluation today patient appears to be doing well with regard to his wound. This is making good progress there does not appear to be signs of active infection but overall I do feel like he is headed in the correct direction. Overall he is tolerating the compression wrap as well. 12/02/2019 upon evaluation today patient appears to be doing well with regard to his leg ulcer. In fact this appears to be completely healed and this is the last of the wounds that he had but still the left anterior lower extremity. Fortunately there is no signs of active infection at this time. No fevers, chills, nausea, vomiting, or diarrhea. Objective Constitutional Obese and well-hydrated in no acute distress. Vitals Time Taken: 3:54 PM, Temperature: 98.6 F, Pulse: 69 bpm, Respiratory Rate: 22 breaths/min, Blood Pressure: 139/63 mmHg. Respiratory normal breathing without difficulty. Psychiatric this patient is able to make decisions and demonstrates good insight into disease process. Alert and Oriented x 3. pleasant and cooperative. General Notes: His wound bed currently showed signs of good granulation at this time there does not appear to be any evidence of active infection which is great news and overall very pleased with where things stand at this point. I do believe that the patient has his Velcro compression wraps and this should be appropriate for continuing to manage his edema. They do seem to fit a lot better than his old wraps to be honest. Integumentary (Hair, Skin) Wound #35 status is Healed - Epithelialized. Original cause of wound was Trauma. The wound is located on the Left Lower Leg. The wound measures 0cm  length x 0cm width x 0cm depth; 0cm^2 area and 0cm^3 volume. There is Fat Layer (Subcutaneous Tissue) Exposed exposed. There is a medium amount of serous drainage noted. The wound margin is flat and intact. There is large (67-100%) pink granulation within the wound bed. There is no necrotic tissue within the wound bed. 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 Discharge From [facility] Services: Discharge from [place] ([id number]) 1. I would recommend that we discontinue wound care services at this point patient is in agreement the plan. 2. I am also can recommend that we go ahead and continue with the Velcro compression wraps for both  lower extremities he seems to be doing well and tolerating this without complication. We will see the patient back for follow-up visit as needed. Electronic Signature(s) Signed: 12/02/2019 4:52:53 PM By: Worthy Keeler PA-C Entered By: Worthy Keeler on 12/02/2019 16:52:53 Adam Merritt ([id number]) -------------------------------------------------------------------------------- SuperBill Details Patient Name: Adam Merritt Date of Service: 12/02/2019 Medical Record Number: [id number] Patient Account Number: [id number] Date of Birth/Sex: 06-03-1946 (74 y.o. M) Treating RN: Cornell Barman Primary Care Provider: [name] Other Clinician: Referring Provider: [name] Treating Provider/Extender: Melburn Hake, Verdis Koval [name]: 9 Diagnosis Coding ICD-10 Codes Code Description I89.0 Lymphedema, not elsewhere classified L97.222 Non-pressure chronic ulcer of left calf with fat layer exposed L97.812 Non-pressure chronic ulcer of other part of right lower leg with fat layer exposed Facility  Procedures CPT4 Code: [id number] Description: 41740 - WOUND CARE VISIT-LEV 2 EST PT Modifier: Quantity: 1 Physician Procedures CPT4 Code: 8144818 Description: 56314 - WC PHYS LEVEL 3 - EST PT Modifier: Quantity: 1 CPT4 Code: Description: ICD-10 Diagnosis Description I89.0 Lymphedema, not elsewhere classified L97.222 Non-pressure chronic ulcer of left calf with fat layer exposed L97.812 Non-pressure chronic ulcer of other part of right lower leg with fat la Modifier: yer exposed Quantity: Electronic Signature(s) Signed: 12/02/2019 4:53:05 PM By: Worthy Keeler PA-C Entered By: Worthy Keeler on 12/02/2019 16:53:04

## 2019-12-03 DIAGNOSIS — I25810 Atherosclerosis of coronary artery bypass graft(s) without angina pectoris: Secondary | ICD-10-CM | POA: Diagnosis not present

## 2019-12-03 DIAGNOSIS — E782 Mixed hyperlipidemia: Secondary | ICD-10-CM | POA: Diagnosis not present

## 2019-12-03 DIAGNOSIS — I10 Essential (primary) hypertension: Secondary | ICD-10-CM | POA: Diagnosis not present

## 2019-12-03 DIAGNOSIS — G4733 Obstructive sleep apnea (adult) (pediatric): Secondary | ICD-10-CM | POA: Diagnosis not present

## 2019-12-03 DIAGNOSIS — I6320 Cerebral infarction due to unspecified occlusion or stenosis of unspecified precerebral arteries: Secondary | ICD-10-CM | POA: Diagnosis not present

## 2019-12-03 DIAGNOSIS — I5032 Chronic diastolic (congestive) heart failure: Secondary | ICD-10-CM | POA: Diagnosis not present

## 2019-12-03 NOTE — Progress Notes (Signed)
Please let the patient know that his potassium level is pretty low. He should begin taking his potassium supplement twice daily, up from one time daily. Also, looks like his kidneys are working a little too hard, though it's improved a little since the last time labs were checked. Does he see a kidney doctor? Thanks.

## 2019-12-03 NOTE — Progress Notes (Signed)
Lmom to call us back 

## 2019-12-04 ENCOUNTER — Other Ambulatory Visit: Payer: Self-pay

## 2019-12-04 DIAGNOSIS — I890 Lymphedema, not elsewhere classified: Secondary | ICD-10-CM

## 2019-12-04 NOTE — Progress Notes (Signed)
Pt.notified

## 2019-12-05 DIAGNOSIS — I509 Heart failure, unspecified: Secondary | ICD-10-CM | POA: Diagnosis not present

## 2019-12-08 DIAGNOSIS — F4310 Post-traumatic stress disorder, unspecified: Secondary | ICD-10-CM | POA: Insufficient documentation

## 2019-12-12 ENCOUNTER — Telehealth: Payer: Self-pay

## 2019-12-12 NOTE — Telephone Encounter (Signed)
Confirmed appointment on 12/16/2019. klh

## 2019-12-16 ENCOUNTER — Ambulatory Visit: Payer: TRICARE For Life (TFL) | Admitting: Internal Medicine

## 2019-12-18 ENCOUNTER — Telehealth: Payer: Self-pay

## 2019-12-18 ENCOUNTER — Encounter: Payer: Self-pay | Admitting: Internal Medicine

## 2019-12-18 ENCOUNTER — Ambulatory Visit (INDEPENDENT_AMBULATORY_CARE_PROVIDER_SITE_OTHER): Payer: Medicare PPO | Admitting: Internal Medicine

## 2019-12-18 ENCOUNTER — Other Ambulatory Visit: Payer: Self-pay

## 2019-12-18 VITALS — BP 123/70 | HR 79 | Temp 97.5°F | Resp 16 | Ht 66.0 in | Wt 299.0 lb

## 2019-12-18 DIAGNOSIS — G4734 Idiopathic sleep related nonobstructive alveolar hypoventilation: Secondary | ICD-10-CM | POA: Diagnosis not present

## 2019-12-18 DIAGNOSIS — I10 Essential (primary) hypertension: Secondary | ICD-10-CM

## 2019-12-18 DIAGNOSIS — J449 Chronic obstructive pulmonary disease, unspecified: Secondary | ICD-10-CM | POA: Diagnosis not present

## 2019-12-18 DIAGNOSIS — J4520 Mild intermittent asthma, uncomplicated: Secondary | ICD-10-CM

## 2019-12-18 NOTE — Progress Notes (Signed)
[facility] [place], [place] [address]  Pulmonary Sleep Medicine   Office Visit Note  Patient Name: Adam Merritt DOB: 27-Nov-1945 MRN [id number]  Date of Service: 12/18/2019  Complaints/HPI: Pt is here for pulmonary follow up. He reports today is a "bad day" for him.  He continues to use oxygen at night intermittently.  He has osa but does not tolerate cpap.      ROS  General: (-) fever, (-) chills, (-) night sweats, (-) weakness Skin: (-) rashes, (-) itching,. Eyes: (-) visual changes, (-) redness, (-) itching. Nose and Sinuses: (-) nasal stuffiness or itchiness, (-) postnasal drip, (-) nosebleeds, (-) sinus trouble. Mouth and Throat: (-) sore throat, (-) hoarseness. Neck: (-) swollen glands, (-) enlarged thyroid, (-) neck pain. Respiratory: - cough, (-) bloody sputum, - shortness of breath, - wheezing. Cardiovascular: - ankle swelling, (-) chest pain. Lymphatic: (-) lymph node enlargement. Neurologic: (-) numbness, (-) tingling. Psychiatric: (-) anxiety, (-) depression   Current Medication: Outpatient Encounter Medications as of 12/18/2019  Medication Sig  . albuterol (VENTOLIN HFA) 108 (90 Base) MCG/ACT inhaler Inhale 2 puffs into the lungs every 6 (six) hours as needed for wheezing or shortness of breath.  Marland Kitchen aspirin 325 MG EC tablet Take 325 mg by mouth daily.  Marland Kitchen atorvastatin (LIPITOR) 40 MG tablet Take 40 mg by mouth at bedtime.   . carvedilol (COREG) 25 MG tablet Take 25 mg by mouth 2 (two) times daily with a meal.  . docusate sodium (COLACE) 50 MG capsule Take 50 mg by mouth 2 (two) times daily.  . febuxostat (ULORIC) 40 MG tablet Take 1 tablet (40 mg total) by mouth daily.  . furosemide (LASIX) 40 MG tablet Take 1 tablet (40 mg total) by mouth daily.  Marland Kitchen gabapentin (NEURONTIN) 300 MG capsule Take 2 cap in the morning, 1 cap in the evening and 2 cap at night.  . hydrALAZINE (APRESOLINE) 25 MG tablet Take 25 mg by mouth 3 (three) times daily.    . indapamide (LOZOL) 2.5 MG tablet Take 1 tablet (2.5 mg total) by mouth daily.  . Insulin Glargine (BASAGLAR KWIKPEN) 100 UNIT/ML INJECT 30 TO 36 UNITS UNDER THE SKIN EVERY DAY  . Insulin Pen Needle (BD PEN NEEDLE NANO U/F) 32G X 4 MM MISC Use as directed with insulin DX e11.65  . ipratropium-albuterol (DUONEB) 0.5-2.5 (3) MG/3ML SOLN Take 3 mLs by nebulization every 4 (four) hours as needed (for shortness of breath).  . liraglutide (VICTOZA) 18 MG/3ML SOPN INJECT 1.8MG  INTO THE SKIN DAILY  . metolazone (ZAROXOLYN) 2.5 MG tablet   . Multiple Vitamin (MULTIVITAMIN WITH MINERALS) TABS tablet Take 1 tablet by mouth daily.  . ondansetron (ZOFRAN) 4 MG tablet Take 1 tablet (4 mg total) by mouth every 8 (eight) hours as needed.  [name] 82N MISC Use twice daily diag e11.65  . pentoxifylline (TRENTAL) 400 MG CR tablet TAKE 1 TABLET BY MOUTH THREE TIMES DAILY FOR CLAUDICATION  . potassium chloride SA (KLOR-CON) 20 MEQ tablet Take 1 tablet (20 mEq total) by mouth 2 (two) times daily. (Patient taking differently: Take 20 mEq by mouth 3 (three) times daily. )  . prazosin (MINIPRESS) 1 MG capsule Take 3 capsules (3 mg total) by mouth at bedtime.  . sennosides-docusate sodium (SENOKOT-S) 8.6-50 MG tablet Take 1-2 tablets by mouth 2 (two) times daily.  . sertraline (ZOLOFT) 100 MG tablet Take 100 mg by mouth daily.   . traZODone (DESYREL) 50 MG  tablet Take 50 mg by mouth at bedtime.   No facility-administered encounter medications on file as of 12/18/2019.    Surgical History: Past Surgical History:  Procedure Laterality Date  . 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 CHOLECYSTECTOMY    . CORONARY ARTERY BYPASS GRAFT      Medical History: Past Medical History:  Diagnosis Date  . Anemia   . CAD (coronary artery disease)   . Chest pain   . Coronary artery disease   .  Diabetes mellitus without complication ([place])   . Diastolic CHF ([place])   . Esophageal reflux   . Herpes zoster without mention of complication   . Hyperlipidemia   . Hypertension   . Insomnia   . Lumbago   . Lymphedema   . Neuropathy in diabetes ([place])   . Obstructive chronic bronchitis without exacerbation ([place])   . Other malaise and fatigue   . Stroke ([place])   . Varicose veins     Family History: Family History  Problem Relation Age of Onset  . Diabetes Mother   . Hyperlipidemia Mother   . Hypertension Mother   . Diabetes Father   . Hyperlipidemia Father   . Hypertension Father   . CAD Father     Social History: Social History   Socioeconomic History  . Marital status: Married    Spouse name: Not on file  . Number of children: Not on file  . Years of education: Not on file  . Highest education level: Not on file  Occupational History  . Occupation: retired  Tobacco Use  . Smoking status: Never Smoker  . Smokeless tobacco: Never Used  Vaping Use  . Vaping Use: Never used  Substance and Sexual Activity  . Alcohol use: No    Comment: quit alcohol 30 years ago  . Drug use: No  . Sexual activity: Not Currently  Other Topics Concern  . Not on file  Social History Narrative   Lives at home with wife, uses a wheelchair now   Social Determinants of Health   Financial Resource Strain: Low Risk   . Difficulty of Paying Living Expenses: Not hard at all  Food Insecurity: No Food Insecurity  . Worried About Charity fundraiser in the Last Year: Never true  . Ran Out of Food in the Last Year: Never true  Transportation Needs: No Transportation Needs  . Lack of Transportation (Medical): No  . Lack of Transportation (Non-Medical): No  Physical Activity: Inactive  . Days of Exercise per Week: 0 days  . Minutes of Exercise per Session: 0 min  Stress: No Stress Concern Present  . Feeling of Stress : Only a little  Social Connections: Moderately Integrated  . Frequency  of Communication with Friends and Family: More than three times a week  . Frequency of Social Gatherings with Friends and Family: More than three times a week  . Attends Religious Services: More than 4 times per year  . Active Member of Clubs or Organizations: No  . Attends Archivist Meetings: Never  . Marital Status: Married  Human resources officer Violence: Not At Risk  . Fear of Current or Ex-Partner: No  . Emotionally Abused: No  . Physically Abused: No  . Sexually Abused: No    Vital Signs: Blood pressure 123/70, pulse 79, temperature (!) 97.5 F (36.4 C), resp. rate 16, height  5\' 6"  (1.676 m), weight 299 lb (135.6 kg), SpO2 92 %.  Examination: General Appearance: The patient is well-developed, well-nourished, and in no distress. Skin: Gross inspection of skin unremarkable. Head: normocephalic, no gross deformities. Eyes: no gross deformities noted. ENT: ears appear grossly normal no exudates. Neck: Supple. No thyromegaly. No LAD. Respiratory: clear bilaterally. Cardiovascular: Normal S1 and S2 without murmur or rub. Extremities: No cyanosis. pulses are equal. Neurologic: Alert and oriented. No involuntary movements.  LABS: Recent Results (from the past 2160 hour(s))  Glucose, capillary     Status: Abnormal   Collection Time: 10/09/19 11:36 AM  Result Value Ref Range   Glucose-Capillary 144 (H) 70 - 99 mg/dL    Comment: Glucose reference range applies only to samples taken after fasting for at least 8 hours.  POCT HgB A1C     Status: Abnormal   Collection Time: 12/01/19 10:09 AM  Result Value Ref Range   Hemoglobin A1C 6.4 (A) 4.0 - 5.6 %   HbA1c POC (<> result, manual entry)     HbA1c, POC (prediabetic range)     HbA1c, POC (controlled diabetic range)    CBC     Status: Abnormal   Collection Time: 12/01/19 10:59 AM  Result Value Ref Range   WBC 7.8 4.0 - 10.5 K/uL   RBC 4.10 (L) 4.22 - 5.81 MIL/uL   Hemoglobin 12.9 (L) 13.0 - 17.0 g/dL   HCT 37.2 (L) 39 -  52 %   MCV 90.7 80.0 - 100.0 fL   MCH 31.5 26.0 - 34.0 pg   MCHC 34.7 30.0 - 36.0 g/dL   RDW 12.0 11.5 - 15.5 %   Platelets 153 150 - 400 K/uL   nRBC 0.0 0.0 - 0.2 %    Comment: Performed at [facility], [address], [place], [place] [address]  Basic metabolic panel     Status: Abnormal   Collection Time: 12/01/19 10:59 AM  Result Value Ref Range   Sodium 135 135 - 145 mmol/L   Potassium 2.9 (L) 3.5 - 5.1 mmol/L   Chloride 92 (L) 98 - 111 mmol/L   CO2 26 22 - 32 mmol/L   Glucose, Bld 212 (H) 70 - 99 mg/dL    Comment: Glucose reference range applies only to samples taken after fasting for at least 8 hours.   BUN 26 (H) 8 - 23 mg/dL   Creatinine, Ser 2.27 (H) 0.61 - 1.24 mg/dL   Calcium 9.0 8.9 - 10.3 mg/dL   GFR calc non Af Amer 27 (L) >60 mL/min   GFR calc Af Amer 32 (L) >60 mL/min   Anion gap 17 (H) 5 - 15    Comment: Performed at [facility], [address] [place], [place] [address]  Vitamin B12     Status: None   Collection Time: 12/01/19 10:59 AM  Result Value Ref Range   Vitamin B-12 499 180 - 914 pg/mL    Comment: (NOTE) This assay is not validated for testing neonatal or myeloproliferative syndrome specimens for Vitamin B12 levels. Performed at [facility], [place] [address] [place], [place] [address]   Folate, serum, performed at [facility] lab     Status: None   Collection Time: 12/01/19 10:59 AM  Result Value Ref Range   Folate 32.0 >5.9 ng/mL    Comment: Performed at [facility], [address] [place], [place] [address]  Ferritin     Status: None   Collection Time: 12/01/19  10:59 AM  Result Value Ref Range   Ferritin 36 24 - 336 ng/mL    Comment: Performed at [facility], [address] [place], [place] [address]  Iron and TIBC     Status: None   Collection Time: 12/01/19 10:59 AM  Result Value Ref Range   Iron 68 45 - 182 ug/dL   TIBC 274 250 - 450 ug/dL   Saturation Ratios 25 17.9 - 39.5 %   UIBC  206 ug/dL    Comment: Performed at [facility], [address], [place], [place] [address]    Radiology: No results found.  No results found.  No results found.    Assessment and Plan: Patient Active Problem List   Diagnosis Date Noted  . PTSD (post-traumatic stress disorder) 12/08/2019  . Type 2 diabetes mellitus with hyperglycemia ([place]) 03/19/2019  . Aplastic anemia, unspecified ([place]) 03/19/2019  . Acute pain of right knee 02/23/2019  . Uncontrolled type 2 diabetes mellitus with hyperglycemia ([place]) 09/17/2018  . Primary insomnia 08/07/2018  . Bacteremia due to group B Streptococcus 07/02/2018  . COPD (chronic obstructive pulmonary disease) ([place]) 05/13/2018  . Dysuria 05/05/2018  . Encounter for general adult medical examination with abnormal findings 05/05/2018  . CHF exacerbation ([place]) 04/16/2018  . Achilles tendon disorder, right 04/02/2018  . Essential hypertension 01/28/2018  . Lymphedema of both lower extremities 01/28/2018  . Cellulitis and abscess of lower extremity 09/20/2017  . Edema of both lower legs due to peripheral venous insufficiency 09/20/2017  . Cellulitis 09/20/2017  . Atopic dermatitis 09/02/2017  . Chronic pain disorder 09/02/2017  . CVA (cerebral vascular accident) ([place]) 09/15/2016  . 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 CVA (cerebral infarction) 02/12/2015  . Chronic diastolic CHF (congestive heart failure), NYHA class 3 ([place]) 11/30/2014  . Benign essential hypertension 11/06/2014  . CAD (coronary artery disease) of artery bypass graft 01/19/2014  . CKD (chronic kidney disease), stage III 01/19/2014  . Acute bronchitis with COPD ([place]) 01/19/2014  . Diabetic neuropathy ([place]) 01/19/2014  . Insulin dependent diabetes mellitus 01/19/2014  . OSA (obstructive sleep apnea) 01/19/2014  . Mixed hyperlipidemia 10/29/2013  . Peripheral vascular disease, unspecified ([place]) 09/29/2013    1. Chronic obstructive pulmonary disease,  unspecified COPD type ([place]) Current controlled, continue to use oxygen and medications as discussed.   2. Mild intermittent asthma, unspecified whether complicated Continue to use inhalers as discussed.   3. Essential hypertension Continue present management.   4. Nocturnal hypoxia Continue to use oxygen at night.   General Counseling: I have discussed the findings of the evaluation and examination with Jin.  I have also discussed any further diagnostic evaluation thatmay be needed or ordered today. Sulaiman verbalizes understanding of the findings of todays visit. We also reviewed his medications today and discussed drug interactions and side effects including but not limited excessive drowsiness and altered mental states. We also discussed that there is always a risk not just to him but also people around him. he has been encouraged to call the office with any questions or concerns that should arise related to todays visit.  No orders of the defined types were placed in this encounter.    Time spent: 30 This patient was seen by Orson Gear AGNP-C in Collaboration with Dr. Devona Konig as a part of collaborative care agreement.   I have personally obtained a history, examined the patient, evaluated laboratory and imaging results, formulated the assessment and plan and placed orders.    Saadat  Richardson Dopp, MD [facility] and [facility] Care Sleep medicine

## 2019-12-18 NOTE — Telephone Encounter (Signed)
Confirmed appointment on 12/18/2019. klh

## 2020-01-05 DIAGNOSIS — I509 Heart failure, unspecified: Secondary | ICD-10-CM | POA: Diagnosis not present

## 2020-01-08 DIAGNOSIS — I25810 Atherosclerosis of coronary artery bypass graft(s) without angina pectoris: Secondary | ICD-10-CM | POA: Diagnosis not present

## 2020-01-08 DIAGNOSIS — I5032 Chronic diastolic (congestive) heart failure: Secondary | ICD-10-CM | POA: Diagnosis not present

## 2020-01-10 IMAGING — US US EXTREM LOW VENOUS BILAT
1 series · 13 of 24 positions shown · non-contrast
Comparison: 10/11/2013

CLINICAL DATA: Redness and swelling in lower extremities. Possible
cellulitis. Rule out DVT.



[Series 1: us extrem low venous bilat · 0.08mm/px · 13 of 58 slices shown]
[im 1/58]
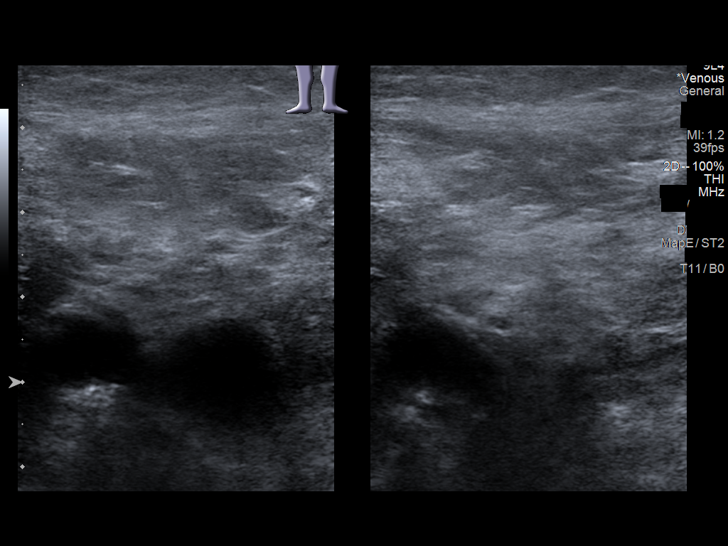
[im 5/58]
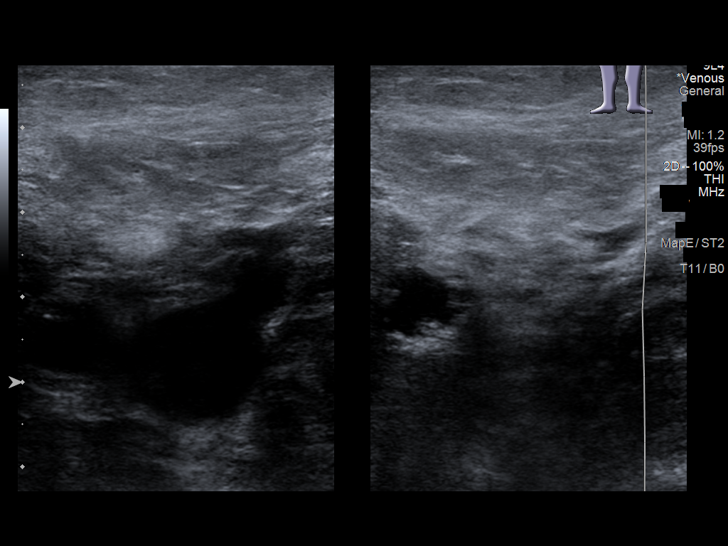
[im 10/58]
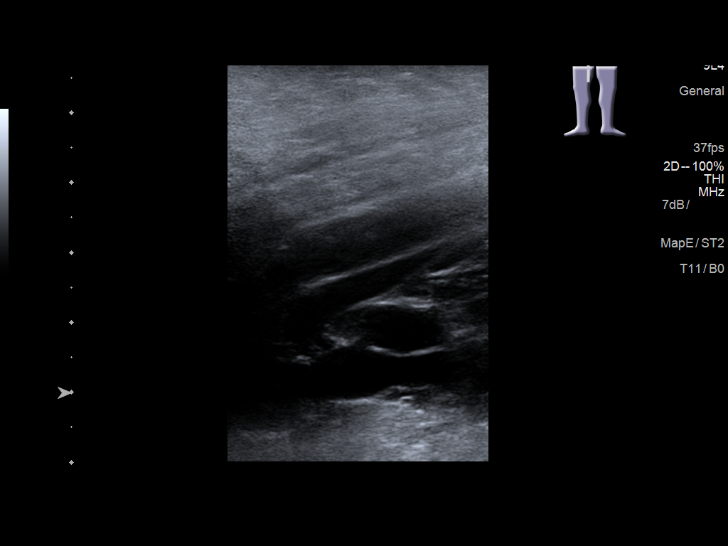
[im 15/58]
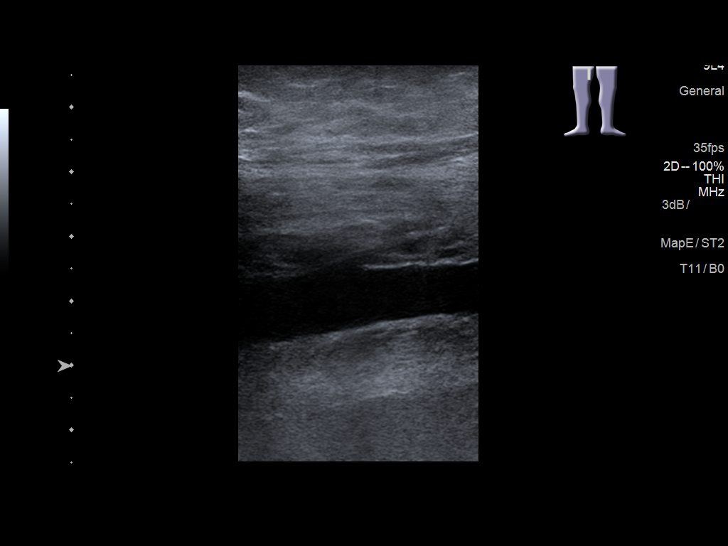
[im 20/58]
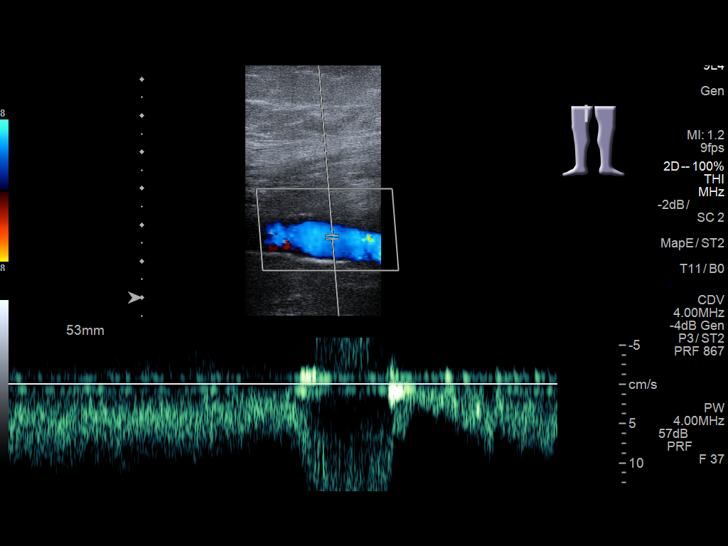
[im 25/58]
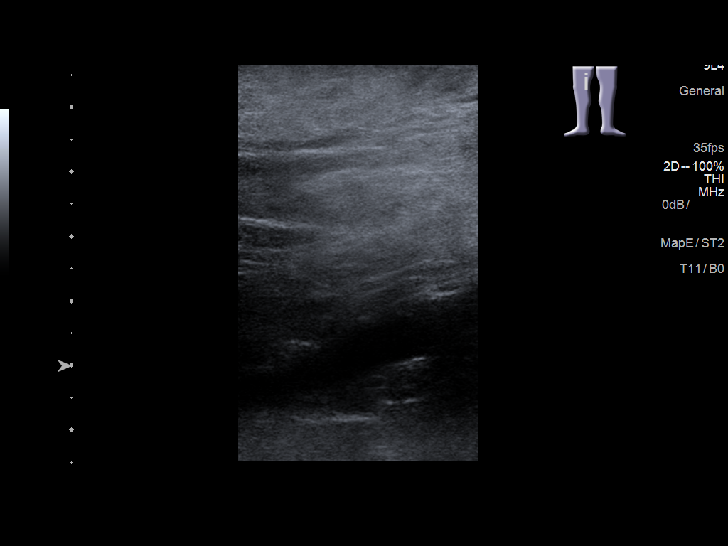
[im 30/58]
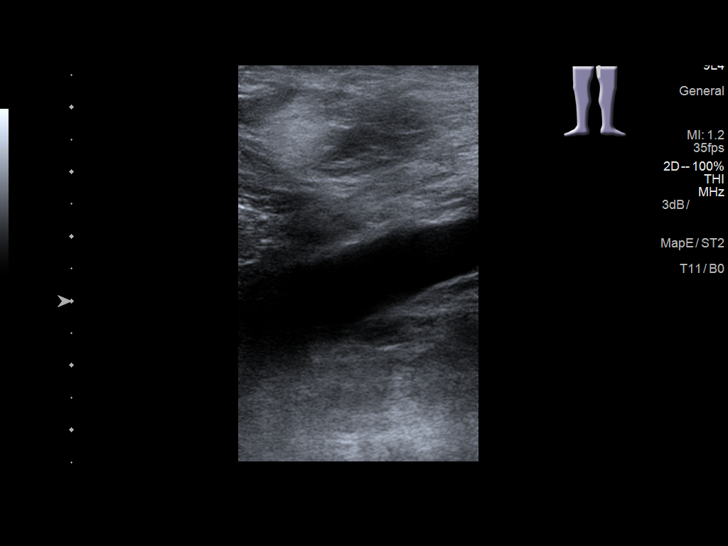
[im 33/58]
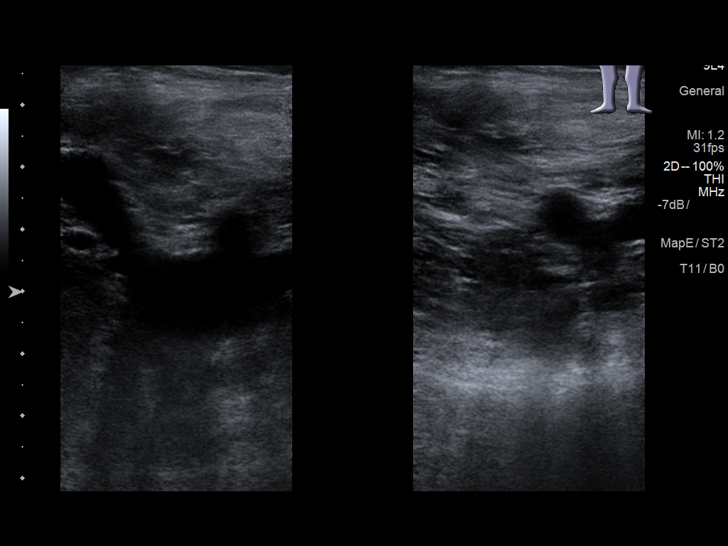
[im 38/58]
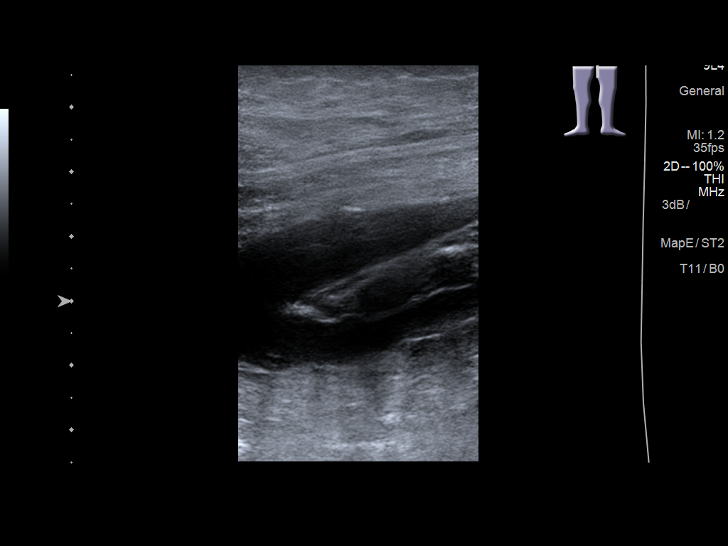
[im 43/58]
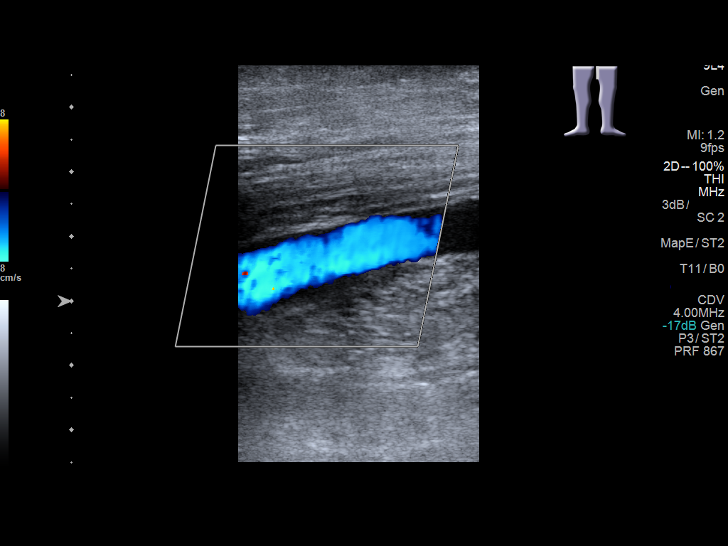
[im 48/58]
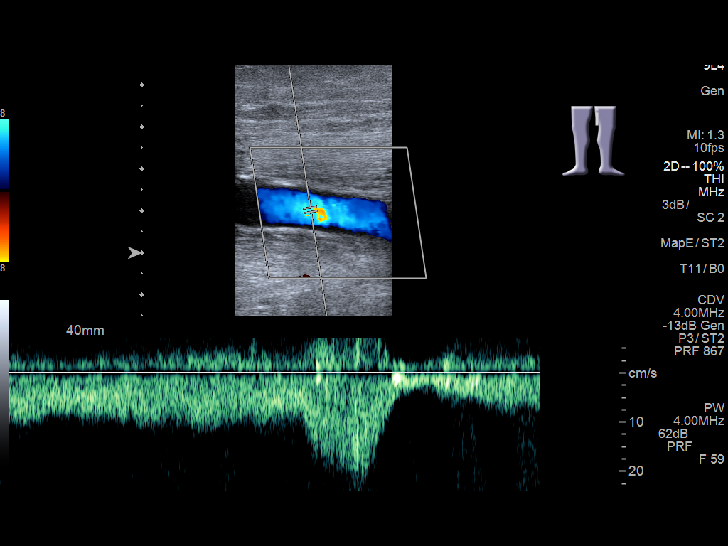
[im 53/58]
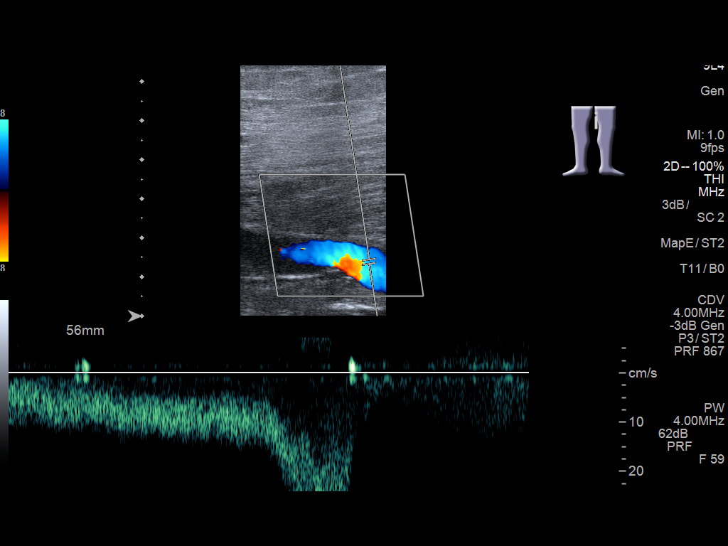
[im 58/58]
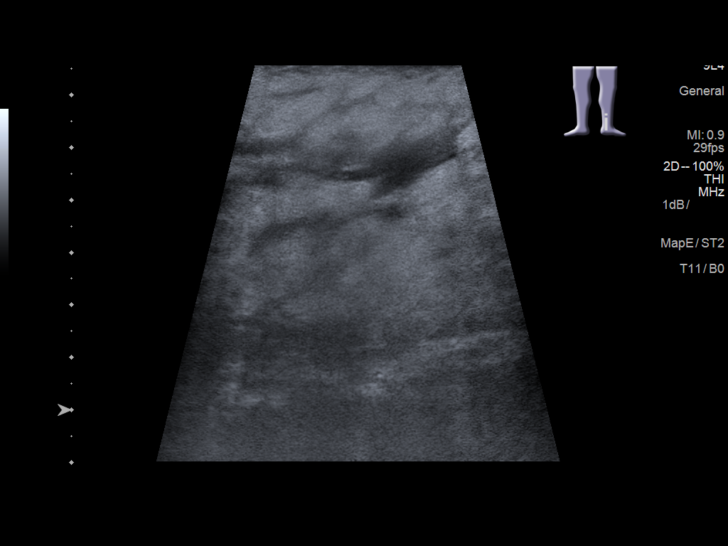

[13 of 24 positions shown; findings below may reference images not displayed]

FINDINGS: RIGHT LOWER EXTREMITY

Common Femoral Vein: No evidence of thrombus. Normal
compressibility, respiratory phasicity and response to augmentation.

Saphenofemoral Junction: No evidence of thrombus. Normal
compressibility and flow on color Doppler imaging.

Profunda Femoral Vein: No evidence of thrombus. Normal
compressibility and flow on color Doppler imaging.

Femoral Vein: No evidence of thrombus. Normal compressibility,
respiratory phasicity and response to augmentation.

Popliteal Vein: No evidence of thrombus. Normal compressibility,
respiratory phasicity and response to augmentation.

Calf Veins: Calf veins not visualized.

LEFT LOWER EXTREMITY

Common Femoral Vein: No evidence of thrombus. Normal
compressibility, respiratory phasicity and response to augmentation.

Saphenofemoral Junction: No evidence of thrombus. Normal
compressibility and flow on color Doppler imaging.

Profunda Femoral Vein: No evidence of thrombus. Normal
compressibility and flow on color Doppler imaging.

Femoral Vein: No evidence of thrombus. Normal compressibility,
respiratory phasicity and response to augmentation.

Popliteal Vein: No evidence of thrombus. Normal compressibility,
respiratory phasicity and response to augmentation.

Calf Veins: Calf veins not visualized.

Other Findings:  Subcutaneous edema.
IMPRESSION: No evidence of deep venous thrombosis in the lower extremities.
However, calf veins could not be evaluated due to body habitus and
edema.

## 2020-01-21 ENCOUNTER — Other Ambulatory Visit: Payer: Self-pay

## 2020-01-21 DIAGNOSIS — J449 Chronic obstructive pulmonary disease, unspecified: Secondary | ICD-10-CM

## 2020-01-21 MED ORDER — ALBUTEROL SULFATE HFA 108 (90 BASE) MCG/ACT IN AERS: 2.0000 | INHALATION_SPRAY | Freq: Four times a day (QID) | RESPIRATORY_TRACT | 5 refills | Status: DC | PRN

## 2020-02-05 ENCOUNTER — Other Ambulatory Visit: Payer: Self-pay

## 2020-02-05 ENCOUNTER — Encounter: Payer: Medicare PPO | Attending: Physician Assistant | Admitting: Physician Assistant

## 2020-02-05 DIAGNOSIS — I890 Lymphedema, not elsewhere classified: Secondary | ICD-10-CM | POA: Insufficient documentation

## 2020-02-05 DIAGNOSIS — L97222 Non-pressure chronic ulcer of left calf with fat layer exposed: Secondary | ICD-10-CM | POA: Diagnosis not present

## 2020-02-05 DIAGNOSIS — I110 Hypertensive heart disease with heart failure: Secondary | ICD-10-CM | POA: Diagnosis not present

## 2020-02-05 DIAGNOSIS — I872 Venous insufficiency (chronic) (peripheral): Secondary | ICD-10-CM | POA: Insufficient documentation

## 2020-02-05 DIAGNOSIS — G4730 Sleep apnea, unspecified: Secondary | ICD-10-CM | POA: Insufficient documentation

## 2020-02-05 DIAGNOSIS — E1140 Type 2 diabetes mellitus with diabetic neuropathy, unspecified: Secondary | ICD-10-CM | POA: Diagnosis not present

## 2020-02-05 DIAGNOSIS — I2510 Atherosclerotic heart disease of native coronary artery without angina pectoris: Secondary | ICD-10-CM | POA: Insufficient documentation

## 2020-02-05 DIAGNOSIS — D649 Anemia, unspecified: Secondary | ICD-10-CM | POA: Insufficient documentation

## 2020-02-05 DIAGNOSIS — K5900 Constipation, unspecified: Secondary | ICD-10-CM | POA: Diagnosis not present

## 2020-02-05 DIAGNOSIS — J449 Chronic obstructive pulmonary disease, unspecified: Secondary | ICD-10-CM | POA: Diagnosis not present

## 2020-02-05 DIAGNOSIS — G8929 Other chronic pain: Secondary | ICD-10-CM | POA: Diagnosis not present

## 2020-02-05 DIAGNOSIS — E11622 Type 2 diabetes mellitus with other skin ulcer: Secondary | ICD-10-CM | POA: Insufficient documentation

## 2020-02-05 DIAGNOSIS — M1990 Unspecified osteoarthritis, unspecified site: Secondary | ICD-10-CM | POA: Insufficient documentation

## 2020-02-05 DIAGNOSIS — E669 Obesity, unspecified: Secondary | ICD-10-CM | POA: Insufficient documentation

## 2020-02-05 DIAGNOSIS — E1151 Type 2 diabetes mellitus with diabetic peripheral angiopathy without gangrene: Secondary | ICD-10-CM | POA: Insufficient documentation

## 2020-02-05 DIAGNOSIS — L97812 Non-pressure chronic ulcer of other part of right lower leg with fat layer exposed: Secondary | ICD-10-CM | POA: Diagnosis not present

## 2020-02-05 DIAGNOSIS — I509 Heart failure, unspecified: Secondary | ICD-10-CM | POA: Diagnosis not present

## 2020-02-05 DIAGNOSIS — L97822 Non-pressure chronic ulcer of other part of left lower leg with fat layer exposed: Secondary | ICD-10-CM | POA: Diagnosis not present

## 2020-02-06 NOTE — Progress Notes (Signed)
Adam, Merritt ([id number]) Visit Report for 02/05/2020 Allergy List Details Patient Name: Adam Merritt, Adam Merritt Date of Service: 02/05/2020 9:15 AM Medical Record Number: [id number] Patient Account Number: [id number] Date of Birth/Sex: March 26, 1946 (74 y.o. M) Treating RN: Adam Merritt Primary Care [name]: [name] Other Clinician: Referring [name]: [name] Treating [name]/Extender: Adam Merritt, Adam Merritt [name]: 0 Allergies Active Allergies Corticosteroids (Glucocorticoids) Allergy Notes Electronic Signature(s) Signed: 02/05/2020 4:51:51 PM By: Adam Merritt Entered By: Adam Merritt on 02/05/2020 09:28:40 Adam Merritt ([id number]) -------------------------------------------------------------------------------- Arrival Information Details Patient Name: Adam Merritt Date of Service: 02/05/2020 9:15 AM Medical Record Number: [id number] Patient Account Number: [id number] Date of Birth/Sex: 01/01/1946 (74 y.o. M) Treating RN: Adam Merritt Primary Care Willy Vorce: [name] Other Clinician: Referring [name]: [name] Treating [name]/Extender: Adam Merritt, Adam Merritt [name]: 0 Visit Information Patient Arrived: Cane Arrival Time: 09:24 Accompanied By: wife in lobby Transfer Assistance: None Patient Identification Verified: Yes Secondary Verification Process Completed: Yes History Since Last Visit Has Compression in Place as Prescribed: Yes Notes Patient is wearing bilateral velcro compression wraps Electronic Signature(s) Signed: 02/05/2020 4:51:51 PM By: Adam Merritt Entered By: Adam Merritt on 02/05/2020 09:26:39 Adam Merritt ([id number]) -------------------------------------------------------------------------------- Clinic Level of Care Assessment Details Patient Name: Adam Merritt Date of Service: 02/05/2020 9:15 AM Medical Record Number: [id number] Patient Account Number: [id number] Date of Birth/Sex: 08/08/1945 (74 y.o.  M) Treating RN: Adam Merritt Primary Care [name]: [name] Other Clinician: Referring [name]: [name] Treating [name]/Extender: Adam Merritt, Adam Merritt [name]: 0 Clinic Level of Care Assessment Items TOOL 2 Quantity Score []  - Use when only an EandM is performed on the INITIAL visit 0 ASSESSMENTS - Nursing Assessment / Reassessment X - General Physical Exam (combine w/ comprehensive assessment (listed just below) when performed on new 1 20 pt. evals) X- 1 25 Comprehensive Assessment (HX, ROS, Risk Assessments, Wounds Hx, etc.) ASSESSMENTS - Wound and Skin Assessment / Reassessment X - Simple Wound Assessment / Reassessment - one wound 1 5 []  - 0 Complex Wound Assessment / Reassessment - multiple wounds []  - 0 Dermatologic / Skin Assessment (not related to wound area) ASSESSMENTS - Ostomy and/or Continence Assessment and Care []  - Incontinence Assessment and Management 0 []  - 0 Ostomy Care Assessment and Management (repouching, etc.) PROCESS - Coordination of Care X - Simple Patient / Family Education for ongoing care 1 15 []  - 0 Complex (extensive) Patient / Family Education for ongoing care []  - 0 Staff obtains Programmer, systems, Records, Test Results / Process Orders []  - 0 Staff telephones HHA, Nursing Homes / Clarify orders / etc []  - 0 Routine Transfer to another Facility (non-emergent condition) []  - 0 Routine Hospital Admission (non-emergent condition) []  - 0 New Admissions / Biomedical engineer / Ordering NPWT, Apligraf, etc. []  - 0 Emergency Hospital Admission (emergent condition) X- 1 10 Simple Discharge Coordination []  - 0 Complex (extensive) Discharge Coordination PROCESS - Special Needs []  - Pediatric / Minor Patient Management 0 []  - 0 Isolation Patient Management []  - 0 Hearing / Language / Visual special needs []  - 0 Assessment of Community assistance (transportation, D/C planning, etc.) []  - 0 Additional assistance / Altered  mentation []  - 0 Support Surface(s) Assessment (bed, cushion, seat, etc.) INTERVENTIONS - Wound Cleansing / Measurement X - Wound Imaging (photographs - any number of wounds) 1 5 []  - 0 Wound Tracing (instead of photographs) X- 1 5 Simple Wound Measurement - one wound []  - 0 Complex Wound Measurement -  multiple wounds Adam Merritt, Adam Merritt ([id number]) X- 1 5 Simple Wound Cleansing - one wound []  - 0 Complex Wound Cleansing - multiple wounds INTERVENTIONS - Wound Dressings X - Small Wound Dressing one or multiple wounds 1 10 []  - 0 Medium Wound Dressing one or multiple wounds []  - 0 Large Wound Dressing one or multiple wounds []  - 0 Application of Medications - injection INTERVENTIONS - Miscellaneous []  - External ear exam 0 []  - 0 Specimen Collection (cultures, biopsies, blood, body fluids, etc.) []  - 0 Specimen(s) / Culture(s) sent or taken to Lab for analysis []  - 0 Patient Transfer (multiple staff / Civil Service fast streamer / Similar devices) []  - 0 Simple Staple / Suture removal (25 or less) []  - 0 Complex Staple / Suture removal (26 or more) []  - 0 Hypo / Hyperglycemic Management (close monitor of Blood Glucose) []  - 0 Ankle / Brachial Index (ABI) - do not check if billed separately Has the patient been seen at the hospital within the last three years: Yes Total Score: 100 Level Of Care: New/Established - Level 3 Electronic Signature(s) Signed: 02/05/2020 4:51:51 PM By: Adam Merritt Entered By: Adam Merritt on 02/05/2020 10:10:27 Adam Merritt ([id number]) -------------------------------------------------------------------------------- Encounter Discharge Information Details Patient Name: Adam Merritt Date of Service: 02/05/2020 9:15 AM Medical Record Number: [id number] Patient Account Number: [id number] Date of Birth/Sex: 1945-10-02 (74 y.o. M) Treating RN: Adam Merritt Primary Care [name]: [name] Other Clinician: Referring [name]: [name] Treating  [name]/Extender: Adam Merritt, Adam Merritt [name]: 0 Encounter Discharge Information Items Discharge Condition: Stable Ambulatory Status: Wheelchair Discharge Destination: Home Transportation: Private Auto Accompanied By: self Schedule Follow-up Appointment: Yes Clinical Summary of Care: Electronic Signature(s) Signed: 02/05/2020 4:51:51 PM By: Adam Merritt Entered By: Adam Merritt on 02/05/2020 10:11:57 Adam Merritt ([id number]) -------------------------------------------------------------------------------- Lower Extremity Assessment Details Patient Name: Adam Merritt Date of Service: 02/05/2020 9:15 AM Medical Record Number: [id number] Patient Account Number: [id number] Date of Birth/Sex: 1946-01-28 (74 y.o. M) Treating RN: Adam Merritt Primary Care Jareth Pardee: [name] Other Clinician: Referring Kaybree Williams: [name] Treating Emylie Amster/Extender: Adam Merritt, Adam Merritt [name]: 0 Edema Assessment Assessed: [Left: No] [Right: No] [Left: Edema] [Right: :] Calf Left: Right: Point of Measurement: 27 cm From Medial Instep 48 cm 42 cm Ankle Left: Right: Point of Measurement: 12 cm From Medial Instep 36 cm 30.5 cm Vascular Assessment Pulses: Dorsalis Pedis Palpable: [Left:Yes] [Right:Yes] Electronic Signature(s) Signed: 02/05/2020 4:51:51 PM By: Adam Merritt Entered By: Adam Merritt on 02/05/2020 09:38:45 Adam Merritt ([id number]) -------------------------------------------------------------------------------- Multi Wound Chart Details Patient Name: Adam Merritt Date of Service: 02/05/2020 9:15 AM Medical Record Number: [id number] Patient Account Number: [id number] Date of Birth/Sex: [birth year] (74 y.o. M) Treating RN: Adam Merritt Primary Care Nader Boys: [name] Other Clinician: Referring Addalynne Golding: [name] Treating Prentis Langdon/Extender: Adam Merritt, Adam Merritt [name]: 0 Vital Signs Height(in): 65 Pulse(bpm): 49 Weight(lbs):  65 Blood Pressure(mmHg): 143/67 Body Mass Index(BMI): 48 Temperature(F): 98.3 Respiratory Rate(breaths/min): 20 Photos: [N/A:N/A] Wound Location: Left, Anterior Lower Leg N/A N/A Wounding Event: Gradually Appeared N/A N/A Primary Etiology: Venous Leg Ulcer N/A N/A Secondary Etiology: Diabetic Wound/Ulcer of the Lower N/A N/A Extremity Comorbid History: Anemia, Lymphedema, Chronic N/A N/A Obstructive Pulmonary Disease (COPD), Sleep Apnea, Congestive Heart Failure, Coronary Artery Disease, Hypertension, Peripheral Venous Disease, Type II Diabetes, Osteoarthritis, Neuropathy, Received Chemotherapy Date Acquired: 01/28/2020 N/A N/A Weeks of Treatment: 0 N/A N/A Wound Status: Open N/A N/A Measurements L x W x D (cm) 0.7x0.5x0.1 N/A N/A Area (cm) :  0.275 N/A N/A Volume (cm) : 0.027 N/A N/A Classification: Full Thickness Without Exposed N/A N/A Support Structures Exudate Amount: Medium N/A N/A Exudate Type: Serous N/A N/A Exudate Color: amber N/A N/A Wound Margin: Flat and Intact N/A N/A Granulation Amount: None Present (0%) N/A N/A Necrotic Amount: Large (67-100%) N/A N/A Exposed Structures: Fat Layer (Subcutaneous Tissue): N/A N/A Yes Fascia: No Tendon: No Muscle: No Joint: No Bone: No Epithelialization: None N/A N/A Treatment Notes Electronic Signature(s) Signed: 02/05/2020 4:51:51 PM By: Adam Merritt ([id number]) Entered By: Adam Merritt on 02/05/2020 10:02:26 Adam Merritt ([id number]) -------------------------------------------------------------------------------- Multi-Disciplinary Care Plan Details Patient Name: Adam Merritt Date of Service: 02/05/2020 9:15 AM Medical Record Number: [id number] Patient Account Number: [id number] Date of Birth/Sex: 03-15-1946 (74 y.o. M) Treating RN: Adam Merritt Primary Care Sammy Cassar: [name] Other Clinician: Referring Kiyana Vazguez: [name] Treating Tariana Moldovan/Extender: Adam Merritt, Adam Merritt Weeks in  Treatment: 0 Active Inactive Wound/Skin Impairment Nursing Diagnoses: Impaired tissue integrity Knowledge deficit related to ulceration/compromised skin integrity Goals: Patient/caregiver will verbalize understanding of skin care regimen Date Initiated: 02/05/2020 Target Resolution Date: 02/20/2020 Goal Status: Active Interventions: Assess patient/caregiver ability to obtain necessary supplies Assess patient/caregiver ability to perform ulcer/skin care regimen upon admission and as needed Assess ulceration(s) every visit Provide education on ulcer and skin care Treatment Activities: Skin care regimen initiated : 02/05/2020 Notes: Electronic Signature(s) Signed: 02/05/2020 4:51:51 PM By: Adam Merritt Entered By: Adam Merritt on 02/05/2020 10:05:01 Adam Merritt ([id number]) -------------------------------------------------------------------------------- Non-Wound Condition Assessment Details Patient Name: Adam Merritt Date of Service: 02/05/2020 9:15 AM Medical Record Number: [id number] Patient Account Number: [id number] Date of Birth/Sex: 08-29-1945 (74 y.o. M) Treating RN: Adam Merritt Primary Care Keisy Strickler: [name] Other Clinician: Referring Delois Silvester: [name] Treating Saumya Hukill/Extender: Adam Merritt, Adam Merritt [name]: 0 Non-Wound Condition: Condition: Lymphedema Location: Leg Side: Bilateral Photos Notes Patient's left leg has multiple small spots of weeping on the anterior portion of the leg. Electronic Signature(s) Signed: 02/05/2020 4:51:51 PM By: Adam Merritt Entered By: Adam Merritt on 02/05/2020 09:37:10 Adam Merritt ([id number]) -------------------------------------------------------------------------------- Pain Assessment Details Patient Name: Adam Merritt Date of Service: 02/05/2020 9:15 AM Medical Record Number: [id number] Patient Account Number: [id number] Date of Birth/Sex: 06-26-1945 (74 y.o. M) Treating RN: Adam Merritt Primary Care Ayana Imhof: [name] Other Clinician: Referring Marshawn Normoyle: [name] Treating Randell Teare/Extender: Adam Merritt, Adam Merritt [name]: 0 Active Problems Location of Pain Severity and Description of Pain Patient Has Paino Yes Site Locations Pain Location: Generalized Pain Rate the pain. Current Pain Level: 5 Pain Management and Medication Current Pain Management: Goals for Pain Management Patient states 5 is his normal pain level all over and he can tolerate the pain at this level. Electronic Signature(s) Signed: 02/05/2020 4:51:51 PM By: Adam Merritt Entered By: Adam Merritt on 02/05/2020 09:27:30 Adam Merritt ([id number]) -------------------------------------------------------------------------------- Patient/Caregiver Education Details Patient Name: Adam Merritt Date of Service: 02/05/2020 9:15 AM Medical Record Number: [id number] Patient Account Number: [id number] Date of Birth/Gender: Mar 28, 1946 (74 y.o. M) Treating RN: Adam Merritt Primary Care Physician: [name] Other Clinician: Referring Physician: [name] Treating Physician/Extender: Sharalyn Ink in Treatment: 0 Education Assessment Education Provided To: Patient Education Topics Provided Wound/Skin Impairment: Handouts: Caring for Your Ulcer Methods: Explain/Verbal Responses: State content correctly Electronic Signature(s) Signed: 02/05/2020 4:51:51 PM By: Adam Merritt Entered By: Adam Merritt on 02/05/2020 10:02:46 Adam Merritt ([id number]) -------------------------------------------------------------------------------- Wound Assessment Details Patient Name: Adam Merritt Date of Service: 02/05/2020 9:15 AM Medical Record  Number: [id number] Patient Account Number: [id number] Date of Birth/Sex: April 14, 1946 (74 y.o. M) Treating RN: Adam Merritt Primary Care Kodie Kishi: [name] Other Clinician: Referring Toshiko Kemler: [name] Treating  Adam Merritt: Adam Merritt, Adam Merritt [name]: 0 Wound Status Wound Number: 36 Primary Venous Leg Ulcer Etiology: Wound Location: Left, Anterior Lower Leg Secondary Diabetic Wound/Ulcer of the Lower Extremity Wounding Event: Gradually Appeared Etiology: Date Acquired: 01/28/2020 Wound Open Weeks Of Treatment: 0 Status: Clustered Wound: No Comorbid Anemia, Lymphedema, Chronic Obstructive Pulmonary History: Disease (COPD), Sleep Apnea, Congestive Heart Failure, Coronary Artery Disease, Hypertension, Peripheral Venous Disease, Type II Diabetes, Osteoarthritis, Neuropathy, Received Chemotherapy Photos Wound Measurements Length: (cm) 0.7 Width: (cm) 0.5 Depth: (cm) 0.1 Area: (cm) 0.275 Volume: (cm) 0.027 % Reduction in Area: % Reduction in Volume: Epithelialization: None Tunneling: No Undermining: No Wound Description Classification: Full Thickness Without Exposed Support Structu Wound Margin: Flat and Intact Exudate Amount: Medium Exudate Type: Serous Exudate Color: amber res Foul Odor After Cleansing: No Slough/Fibrino Yes Wound Bed Granulation Amount: None Present (0%) Exposed Structure Necrotic Amount: Large (67-100%) Fascia Exposed: No Necrotic Quality: Adherent Slough Fat Layer (Subcutaneous Tissue) Exposed: Yes Tendon Exposed: No Muscle Exposed: No Joint Exposed: No Bone Exposed: No Treatment Notes Wound #36 (Left, Anterior Lower Leg) Notes Adam Merritt, Adam Merritt. ([id number]) scell, abd, kerlex. farrow wraps bilateral lower legs Electronic Signature(s) Signed: 02/05/2020 4:51:51 PM By: Adam Merritt Entered By: Adam Merritt on 02/05/2020 09:43:28 Adam Merritt, Adam Merritt ([id number]) -------------------------------------------------------------------------------- [place] Details Patient Name: Adam Merritt Date of Service: 02/05/2020 9:15 AM Medical Record Number: [id number] Patient Account Number: [id number] Date of Birth/Sex: 08/11/1945 (74 y.o.  M) Treating RN: Adam Merritt Primary Care Tiyon Sanor: [name] Other Clinician: Referring Aliahna Statzer: [name] Treating Jamyria Ozanich/Extender: Adam Merritt, Adam Merritt [name]: 0 Vital Signs Time Taken: 09:27 Temperature (F): 98.3 Height (in): 65 Pulse (bpm): 66 Weight (lbs): 289 Respiratory Rate (breaths/min): 20 Body Mass Index (BMI): 48.1 Blood Pressure (mmHg): 143/67 Reference Range: 80 - 120 mg / dl Electronic Signature(s) Signed: 02/05/2020 4:51:51 PM By: Adam Merritt Entered By: Adam Merritt on 02/05/2020 09:28:16

## 2020-02-06 NOTE — Progress Notes (Signed)
Adam, Merritt ([id number]) Visit Report for 02/05/2020 Chief Complaint Document Details Patient Name: Adam Merritt, Adam Merritt Date of Service: 02/05/2020 9:15 AM Medical Record Number: [id number] Patient Account Number: [id number] Date of Birth/Sex: 1945-08-09 (74 y.o. M) Treating RN: Cornell Barman Primary Care Provider: [name] Other Clinician: Referring Provider: [name] Treating Provider/Extender: Melburn Hake, Abron Neddo [name]: 0 Information Obtained from: Patient Chief Complaint Left leg ulcers Electronic Signature(s) Signed: 02/05/2020 10:00:49 AM By: Worthy Keeler PA-C Entered By: Worthy Keeler on 02/05/2020 10:00:49 Adam Merritt ([id number]) -------------------------------------------------------------------------------- HPI Details Patient Name: Adam Merritt Date of Service: 02/05/2020 9:15 AM Medical Record Number: [id number] Patient Account Number: [id number] Date of Birth/Sex: [birth year] (74 y.o. M) Treating RN: Cornell Barman Primary Care Provider: [name] Other Clinician: Referring Provider: [name] Treating Provider/Extender: Melburn Hake, Melesa Lecy [name]: 0 History of Present Illness HPI Description: 10/18/17-He is here for initial evaluation of bilateral lower extremity ulcerations in the presence of venous insufficiency and lymphedema. He has been seen by vascular medicine in the past, Dr. Lucky Cowboy, last seen in 2016. He does have a history of abnormal ABIs, which is to be expected given his lymphedema and venous insufficiency. According to Epic, it appears that all attempts for arterial evaluation and/or angiography were not follow through with by patient. He does have a history of being seen in lymphedema clinic in 2018, stopped going approximately 6 months ago stating "it didn't do any good". He does not have lymphedema pumps, he does not have custom fit compression wrap/stockings. He is diabetic and his recent A1c last month was 7.6. He admits to chronic  bilateral lower extremity pain, no change in pain since blister and ulceration development. He is currently being treated with Levaquin for bronchitis. He has home health and we will continue. 10/25/17-He is here in follow-up evaluation for bilateral lower extremity ulcerationssubtle he remains on Levaquin for bronchitis. Right lower extremity with no evidence of drainage or ulceration, persistent left lower extremity ulceration. He states that home health has not been out since his appointment. He went to [facility] on Tuesday, studies revealed: RIGHT ABI 0.9, TBI 0.6 LEFT ABI 1.1, TBI 0.6 with triphasic flow bilaterally. We will continue his same treatment plan. He has been educated on compression therapy and need for elevation. He will benefit from lymphedema pumps 11/01/17-He is here in follow-up evaluation for left lower extremity ulcer. The right lower extremity remains healed. He has home health services, but they have not been out to see the patient for 2-3 weeks. He states it home health physical therapy changed his dressing yesterday after therapy; he placed Ace wrap compression. We are still waiting for lymphedema pumps, reordered d/t need for company change. 11/08/17-He is here in follow-up evaluation for left lower extremity ulcer. It is improved. Edema is significantly improved with compression therapy. We will continue with same treatment plan and he will follow-up next week. No word regarding lymphedema pumps 11/15/17-He is here in follow-up evaluation for left lower extremity ulcer. He is healed and will be discharged from wound care services. I have reached out to [facility] regarding his lymphedema pumps. They have been unable to reach the patient; the contact number they had with the patient's wife's cell phone and she has not answered any unrecognized calls. Contact should be made today, trial planned for next week; [facility] will continue to  follow 11/27/17 on evaluation today patient has multiple blistered areas over the right lower  extremity his left lower extremity appears to be doing okay. These blistered areas show signs of no infection which is great news. With that being said he did have some necrotic skin overlying which was mechanically debrided away with saline and gauze today without complication. Overall post debridement the wounds appear to be doing better but in general his swelling seems to be increased. This is obviously not good news. I think this is what has given rise to the blisters. 12/04/17 on evaluation today patient presents for follow-up concerning his bilateral lower extremity edema in the right lower extremity ulcers. He has been tolerating the dressing changes without complication. With that being said he has had no real issues with the wraps which is also good news. Overall I'm pleased with the progress he's been making. 12/11/17 on evaluation today patient appears to be doing rather well in regard to his right lateral lower extremity ulcer. He's been tolerating the dressing changes without complication. Fortunately there does not appear to be any evidence of infection at this time. Overall I'm pleased with the progress that is being made. Unfortunately he has been in the hospital due to having what sounds to be a stomach virus/flu fortunately that is starting to get better. 12/18/17 on evaluation today patient actually appears to be doing very well in regard to his bilateral lower extremities the swelling is under fairly good control his lymphedema pumps are still not up and running quite yet. With that being said he does have several areas of opening noted as far as wounds are concerned mainly over the left lower extremity. With that being said I do believe once he gets lymphedema pumps this would least help you mention some the fluid and preventing this from occurring. Hopefully that will be set up soon sleeves  are Artie in place at his home he just waiting for the machine. 12/25/17 on evaluation today patient actually appears to be doing excellent in fact all of his ulcers appear to have resolved his legs appear very well. I do think he needs compression stockings we have discussed this and they are actually going to go to [place] today to elastic therapy to get this fitted for him. I think that is definitely a good thing to do. Readmission: 04/09/18 upon evaluation today this patient is seen for readmission due to bilateral lower extremity lymphedema. He has significant swelling of his extremities especially on the left although the right is also swollen he has weeping from both sides. There are no obvious open wounds at this point. Fortunately he has been doing fairly well for quite a bit of time since I last saw him. Nonetheless unfortunately this seems to have reopened and is giving quite a bit of trouble. He states this began about a week ago when he first called [place] to get in to be seen. No fevers, chills, nausea, or vomiting noted at this time. He has not been using his lymphedema pumps due to the fact that they won't fit on his leg at this point likewise is also not been using his compression for essentially the same reason. 04/16/18 upon evaluation today patient actually appears to be doing a little better in regard to the fluid in his bilateral lower extremities. With that being said he's had three falls since I saw him last week. He also states that he's been feeling very poorly. I was concerned last week and feel like that the concern is still there as far as the congestion in  his chest is concerned he seems to be breathing about the same as last week but again he states he's very weak he's not even able to walk further than from the chair to the door. His wife had to buy a wheelchair just to be able to get them out of the house to get to the appointment today. This has me very  concerned. 04/23/18 on evaluation today patient actually appears to be doing much better than last week's evaluation. At that time actually had to transport him to the ER via EMS and he subsequently was admitted for acute pulmonary edema, acute renal failure, and acute congestive heart failure. Fortunately he is doing much better. Apparently they did dialyze him and were able to take off roughly 35 pounds of fluid. Nonetheless he is feeling much better both in regard to his breathing and he's able to get around much better at this time compared to previous. Overall I'm very THURMOND, HILDEBRAN ([id number]) happy with how things are at this time. There does not appear to be any evidence of infection currently. No fevers, chills, nausea, or vomiting noted at this time. 04/30/2018 patient seen today for follow-up and management of bilateral lower extremity lymphedema. He did express being more sad today than usual due to the recent loss of his dog. He states that he has been compliant with using the lymphedema pumps. However he does admit a minute over the last 2-3 days he has not been using the pumps due to the recent loss of his dog. At this time there is no drainage or open wounds to his lower extremities. The left leg edema is measuring smaller today. Still has a significant amount of edema on bilateral lower extremities With dry flaky skin. He will be referred to the lymphedema clinic for further management. Will continue 3 layer compression wraps and follow-up in 1-2 weeks.Denies any pain, fever, chairs recently. No recent falls or injuries reported during this visit. 05/07/18 on evaluation today patient actually appears to be doing very well in regard to his lower extremities in general all things considered. With that being said he is having some pain in the legs just due to the amount of swelling. He does have an area where he had a blister on the left lateral lower extremity this is open at this  point other than that there's nothing else weeping at this time. 05/14/18 on evaluation today patient actually appears to be doing excellent all things considered in regard to his lower extremities. He still has a couple areas of weeping on each leg which has continued to be the issue for him. He does have an appointment with the lymphedema clinic although this isn't until February 2020. That was the earliest they had. In the meantime he has continued to tolerate the compression wraps without complication. 05/28/18 on evaluation today patient actually appears to be doing more poorly in regard to his left lower extremity where he has a wound open at this point. He also had a fall where he subsequently injured his right great toe which has led to an open wound at the site unfortunately. He has been tolerating the dressing changes without complication in general as far as the wraps are concerned that he has not been putting any dressing on the left 1st toe ulcer site. 06/11/18 on evaluation today patient appears to be doing much worse in regard to his bilateral lower extremity ulcers. He has been tolerating the dressing changes without complication although  his legs have not been wrapped more recently. Overall I am not very pleased with the way his legs appear. I do believe he needs to be back in compression wraps he still has not received his compression wraps from the [facility] hospital as of yet. 06/18/18 on evaluation today patient actually appears to be doing significantly better than last time I saw him. He has been tolerating the compression wraps without complication in the circumferential ulcers especially appear to be doing much better. His toe ulcer on the right in regard to the great toe is better although not as good as the legs in my pinion. No fevers chills noted 07/02/18 on evaluation today patient appears to be doing much better in regard to his lower extremity ulcers. Unfortunately since I last  saw him he's had the distal portion of his right great toe if he dated it sounds as if this actually went downhill very quickly. I had only seen him a few days prior and the toe did not appear to be infected at that point subsequently became infected very rapidly and it was decided by the surgeon that the distal portion of the toe needed to be removed. The patient seems to be doing well in this regard he tells me. With that being said his lower extremities are doing better from the standpoint of the wounds although he is significantly swollen at this point. 07/09/18 on evaluation today patient appears to be doing better in regard to the wounds on his lower extremities. In fact everything is almost completely healed he is just a small area on the left posterior lower extremity that is open at this point. 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 With that being said he needs some kind of compression it does sound like he may not be wearing his compression, that is the wraps, during the entire time between when he's here visit to visit. Apparently his wife took the current one off because it began to "fall apart". 07/16/18 on evaluation today patient appears to be extremely swollen especially in regard to his left lower extremity unfortunately. He also has a new skin tear over the left lower extremity and there's a smaller area on the right lower extremity as well. Unfortunately this seems to be due in part to blistering and fluid buildup in his leg. He did get the reduction wraps that were ordered by the [facility] hospital for him to go to lymphedema clinic. With that being said his wounds on the legs have not healed to the point to where they would likely accept them as a patient lymphedema clinic currently. We need to try to  get this to heal. With that being said he's been taking his wraps off which is not doing him any favors at this point. In fact this is probably quite counterproductive compared to what needs to occur. We will likely need to increase to a four layer compression wrap and continue to also utilize elevation and he has to keep the wraps on not take them off as he's been doing currently hasn't had a wrap on since Saturday. 07/23/18 on evaluation today patient appears to be doing much better in regard to his bilateral lower extremities. In fact his left lower extremity which was the largest is actually 15 cm smaller today compared to  what it was last time he was here in our clinic. This is obviously good news after just one week. Nonetheless the differences he actually kept the wraps on during the entire week this time. That's not typical for him. I do believe he understands a little bit better now the severity of the situation and why it's important for him to keep these wraps on. 07/30/18 on evaluation today patient actually appears to be doing rather well in regard to his lower extremities. His legs are much smaller than they have been in the past and he actually has only one very small rudder superficial region remaining that is not closed on the left lateral/posterior lower extremity even this is almost completely close. I do believe likely next week he will be healed without any complications. I do think we need to continue the wraps however this seems to be beneficial for him. I also think it may be a good time for [place] to go ahead and see about getting the appointment with the lymphedema clinic which is supposed to be made for him in order to keep this moving along and hopefully get them into compression wraps that will in the end help him to remain healed. 08/06/18 on evaluation today patient actually appears to be doing very well in regard as bilateral lower extremities. In fact his wounds appear to  be completely healed at this time. 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 He is in the process of getting appointment with the lymphedema clinic we have made this referral were just waiting to hear back on the schedule time. We need to follow up on that today as well. 08/13/18 on evaluation today patient actually appears to be doing very well in regard to his bilateral lower extremities there are no open wounds at this point. We are gonna go ahead and see about ordering the Velcro compression wraps for him had a discussion with them about [place] doing it versus the [place] they feel like they can definitely afford going ahead and get the wraps themselves and they would prefer to try to avoid having to go to the lymphedema clinic if it all possible which I completely understand. As long as he has good compression I'm okay either way. 3/17//20 on evaluation today patient actually appears to be doing well in regard to his bilateral lower extremity ulcers. He has been tolerating the dressing changes without complication specifically the compression wraps. Overall is had no issues my fingers finding that I see at this point is that he is having trouble with constipation. He tells me he has not been able to go to the bathroom for about six days. He's taken to over-the- counter oral laxatives unfortunately this is not helping. He has not contacted his doctor. BRANDON, WIECHMAN ([id number]) 08/27/18 on evaluation today patient appears to be doing fairly well in regard to his lower extremities at this point. There does not appear to be any new altars is swelling is very well controlled. We are still waiting for his Velcro compression wraps to arrive that should be sometime in the next week is Artie sent the check for these. 09/03/18 on evaluation today patient appears to be doing excellent in regard to his bilateral lower extremities which he shows no signs of  wound openings in regard to this point. He does have his Velcro compression wraps which did arrive in the mail since I saw him last week. Overall  he is doing excellent in my pinion. 09/13/18 on evaluation today patient appears to be doing very well currently in regard to the overall appearance of his bilateral lower extremities although he's a little bit more swollen than last time we saw him. At that point he been discharged without any open wounds. Nonetheless he has a small open wound on the posterior left lower extremity with some evidence of cellulitis noted as well. Fortunately I feel like he has made good progress overall with regard to his lower extremities from were things used to be. 09/20/18 on evaluation today patient actually appears to be doing much better. The erythematous lower extremity is improving wound itself which is still open appears to be doing much better as far as but appearance as well as pain is concerned overall very pleased in this regard. There's no signs of active infection at this time. 09/27/18 on evaluation today patient's wounds on the lower extremity actually appear to be doing fairly well at this time which is good news. There is no evidence of active infection currently and again is just as left lower extremity were there any wounds at this point anyway. I believe they may be completely healed but again I'm not 100% sure based on evaluation today. I think one more week of observation would likely be a good idea. 10/04/18 on evaluation today patient actually appears to be doing excellent in regard to the left lower extremity which actually appears to be completely healed as of today. Unfortunately he's been having issues with his right lower extremity have a new wound that has opened. Fortunately there's no evidence of active infection at this time which is good news. 10/10/18 upon evaluation today patient actually appears to be doing a little bit worse with the new  open area on his right posterior lower extremity. He's been tolerating the dressing changes without complication. Right now we been using his compression wraps although I think we may need to switch back to actually performing bilateral compression wraps are in the clinic. No fevers, chills, nausea, or vomiting noted at this time. 10/17/18 on evaluation today patient actually appears to be doing quite well in regard to his bilateral lower extremity ulcers. He has been tolerating the reps without complication although he would prefer not to rewrap his legs as of today. Fortunately there's no signs of active infection which is good news. No fevers, chills, nausea, or vomiting noted at this time. 10/24/18 on evaluation today patient appears to be doing very well in regard to his lower extremities. His right lower extremity is shown signs of healing and his left lower Trinity though not healed appears to be improving which is excellent news. Overall very pleased with how things seem to be progressing at this time. The patient is likewise happy to hear this. His Velcro compression wraps however have not been put on properly will gonna show his wife how to do that properly today. 10/31/18 on evaluation today patient appears to be doing more poorly in regard to his left lower extremity in particular although both lower extremities actually are showing some signs of being worse than my previous evaluation. Unfortunately I'm just not sure that his compression stockings even with the use of the compression/lymphedema pumps seem to be controlling this well. Upon further questioning he tells me that he also is not able to lie flat in the bed due to his congestive heart failure and difficulty breathing. For that reason he sleeps in his recliner.  To make matters worse his recliner also cannot even hold his legs up so instead of even being somewhat elevated they pretty much hang down to the floor. This is the way he  sleeps each night which is definitely counterproductive to everything else that were attempting to do from the standpoint of controlling his fluid. Nonetheless I think that he potentially could benefit from a hospital bed although this would be something that his primary care provider would likely have to order since anything that is order on our side has to be directly related to wound care and again the hospital bed is not necessarily a direct relation to although I think it does contribute to his overall wound status on his lower extremities. 11/07/18 on evaluation today patient appears to be doing better in regard to his bilateral lower extremity. He's been tolerating the dressing changes without complication. We did in the interim since I last saw him switch to just using extras orbiting new alginate and that seems to have done much better for him. I'm very pleased with the overall progress that is made. 11/14/18 on evaluation today patient appears to be redoing rather well in regard to his left lower extremity ulcers which are the only ones remaining at this point. Fortunately there's no signs of active infection at this time which is good news. Overall been very pleased with how things seem to be progressing currently. No fevers, chills, nausea, or vomiting noted at this time. 11/20/17 on evaluation today patient actually appears to be doing quite well in regard to his lower extremities on the left on the right he has several blisters that showed up although there's some question about whether or not he has had his broker compression wraps on like he was supposed to or not. Fortunately there's no signs of active infection at this time which is good news. Unfortunately though he is doing well from the standpoint of the left leg the right leg is not doing as well again this is when we did not wrap last week. 11/28/18 on evaluation today patient appears to be doing well in regard to his bilateral lower  extremities is left appears to be healed is right is not healed but is very close to being so. Overall very pleased with how things seem to be progressing. Patient is likewise happy that things are doing well. 12/09/18 on evaluation today patient actually appears to be completely healed which is excellent news. He actually seems to be doing well in regard to the swelling of the bilateral lower extremities which is also great news. Overall very pleased with how things seem to be progressing. Readmission: 03/18/2019 upon evaluation today patient presents for reevaluation concerning issues that he is having with his left lower extremity where he does have a wound noted upon inspection today. He also has a wound on the right second toe on the tip where it is apparently been rubbing in his shoe I did look at issues and you can actually see where this has been occurring as well. Fortunately there is no signs of infection with regard to the toe necessarily although it is very swollen compared to normal that does have me somewhat concerned about the possibility of further evaluation for infection/osteomyelitis. I would recommend an x-ray to start with. Otherwise he states that it is been 1-2 weeks that he has had the draining in regard to left lower extremity he does not know how this happened he has been wearing his compression  appropriately and I do see that as well today but nonetheless I do think that he needs to continue to be very cautious with regard to elevation as well. GAREK, SCHUNEMAN ([id number]) 03/25/2019 on evaluation today patient appears to be doing much better in regard to his lower extremity at this time. That is on the left. He did culture positive for Pseudomonas but the good news is he seems to be doing much better the gentamicin we applied topically seems to have done a great job for him. There is no signs of active infection at this time systemically and locally he is doing much better.  No fevers, chills, nausea, vomiting, or diarrhea. In regard to his right toe this seems to be doing much better there is very little pressure noted at this point I did clean up some of the callus today around the edges of the wound as well as the surface of the wound but to be honest he is progressing quite nicely. 10/30; the area on the left anterior lower tibia area is healed over. His edema control is adequate even though his use of the compression pumps seems very intermittent. He has a juxta lite stocking on the right leg and a think there is 1 for the left leg at home. His wife is putting these on. He has an area on the plantar right second toe which is a hammertoe they are trying to offload this 04/08/2019 on evaluation today patient appears to be doing well in regard to his lower extremities which is good news. We are seeing him today for his toe ulcer which is giving him a little bit more trouble but again still seems to be doing better at this point which is good news. He is going require some sharp debridement in regard to the toe today however. 04/15/2019 on evaluation today patient actually appears to be doing quite well with regard to his toe ulcer. I am very pleased with how things are doing in that regard. With regard to his lower extremity edema in general he tells me he is not been using the lymphedema pumps regularly although he does not use them. I think that he needs to use them more regularly he does have a small blister on the left lower extremity this is not open at this point but obviously it something that can get worse if he does not keep the compression going and the pumps going as well. 04/22/2019 on evaluation today patient appears to be doing well with regard to his toe ulcer. He has been tolerating the dressing changes without complication. This is measuring slightly smaller this week as compared to last week. Fortunately there is no signs of active infection at this  time. 05/06/2019 on evaluation today patient actually appears to be doing excellent. In fact his toe appears to be completely healed which is great news. There is no signs of active infection at this time. Readmission: Patient presents today for follow-up after having been discharged at the beginning of December 2020. He states that in recent weeks things have reopened and has been having more trouble at this point. With that being said fortunately there is no signs of active infection at this time. No fever chills noted. Nonetheless he also does have an issue with one of his toes as well that seems to be somewhat this is on the right foot second toe. 07/25/2019 upon evaluation today patient's lower extremities appear to be doing excellent bilaterally. I really feel  like he is showing signs of great improvement and in fact I think he is close to healing which is great news. There is no signs of active infection at this time. No fevers, chills, nausea, vomiting, or diarrhea. 07/31/2019 upon evaluation today patient appears to be doing excellent and in fact I think may be completely healed in regard to his right lower extremity that were still to monitor this 1 more week before closing it out. The left lower extremity is measuring smaller although not completely closed seems to be doing excellent. 08/07/2019 upon evaluation today patient appears to be doing excellent in regard to his wounds. In fact the right lower extremity is completely healed there is no issues here. The left lower extremity has 1 very small area still remaining fortunately there is no signs of infection and overall I think he is very close to closure of the here as well. 08/14/19 upon evaluation today patient appears to be doing excellent in regard to his lower extremities. In fact he appears to be completely healed as of today. Fortunately there's no signs of active infection at this time. No fevers, chills, nausea, or vomiting noted  at this time. READMISSION 09/26/2019 This is a 74 year old man who is well-known to this clinic having just been discharged on 08/14/2019. He has known lymphedema and chronic venous insufficiency. He has Farrow wrap stockings and also external compression pumps. He does not use the external compression pumps however. He does however apparently fairly faithfully use the stockings. They have not been lotion in his legs. He has developed new wounds on the right anterior as well as the left medial left lateral and left posterior calf. He returns for our review of this Past medical history includes coronary artery disease, hypertension, congestive heart failure, COPD, venous insufficiency with lymphedema, type 2 diabetes. He apparently has compression pumps, Farrow wraps and at 1 point a hospital bed although I believe he sleeps in a recliner 10/03/2019 upon evaluation today patient appears to be doing better with regard to his wounds in general. He has been tolerating the dressing changes without complication. Fortunately there is no signs of active infection. No fevers, chills, nausea, vomiting, or diarrhea. I do believe the compression wraps are helping as they always have in the past. 10/10/2019 upon evaluation today patient appears to be doing excellent at this point. His right lower extremity is measuring much better and in fact is completely healed. His left lower extremity is also measuring better though not healed seems to be doing excellent at this point which is great news. Overall I am very pleased with how things appear currently. 10/27/2019 upon evaluation today patient appears to be doing quite well with regard to his wounds currently. He has been tolerating the dressing changes without complication. Fortunately there is no signs of active infection at this time. In fact he is almost completely healed on the left and has just a very small area open and on the right he is completely  healed. 11/04/2019 upon evaluation today patient appears to be doing quite well with regard to his wounds. In fact he appears to be quite possibly completely healed on the left lower extremity now as well the right lower extremity is still doing well. With that being said unfortunately though this may be healed I think it is a very fragile area and I cannot even confirm there is not a very small opening still remaining. I really think he may benefit from 1 additional weeks  wrapping before discontinuing. 11/10/2019 upon evaluation today patient appears to be doing well in regard to his original wounds in fact everything that we were treating last week is completely healed on the left. Unfortunately he has a new skin tear just above where the wrap slipped down due to a fall he sustained causing this injury. 11/17/2019 on evaluation today patient appears to be doing better with regard to his wound. He has been tolerating the dressing changes without VIKASH, NEST. ([id number]) complication. Fortunately there is no signs of active infection at this time. No fevers, chills, nausea, vomiting, or diarrhea. 11/24/2019 upon evaluation today patient appears to be doing well with regard to his wound. This is making good progress there does not appear to be signs of active infection but overall I do feel like he is headed in the correct direction. Overall he is tolerating the compression wrap as well. 12/02/2019 upon evaluation today patient appears to be doing well with regard to his leg ulcer. In fact this appears to be completely healed and this is the last of the wounds that he had but still the left anterior lower extremity. Fortunately there is no signs of active infection at this time. No fevers, chills, nausea, vomiting, or diarrhea. Readmission: 02/05/2020 on evaluation today patient appears to be doing well with regard to his wound all things considered. He actually tells me that a couple weeks ago he had  trouble with his wraps and therefore took him off for a couple of days in order to give his legs a break. It was during this time that he actually developed increased swelling and edema of his legs and blisters on both legs. That is when he called to make the appointment. Nonetheless since that time he began wearing his compression wraps which are Velcro in the meantime and has done extremely well with this. In fact his leg appears to be under good control as far as edema is concerned today it is nothing like what I am seeing when he has come in for evaluations in the past. They have been using some of the silver alginate at home which has been beneficial for him. Fortunately there is no signs of active infection at this time. No fevers, chills, nausea, vomiting, or diarrhea. Electronic Signature(s) Signed: 02/05/2020 10:26:07 AM By: Worthy Keeler PA-C Entered By: Worthy Keeler on 02/05/2020 10:26:06 Adam Merritt ([id number]) -------------------------------------------------------------------------------- Physical Exam Details Patient Name: Adam Merritt Date of Service: 02/05/2020 9:15 AM Medical Record Number: [id number] Patient Account Number: [id number] Date of Birth/Sex: 17-Sep-1945 (74 y.o. M) Treating RN: Cornell Barman Primary Care Provider: [name] Other Clinician: Referring Provider: [name] Treating Provider/Extender: Melburn Hake, Ata Pecha [name]: 0 Constitutional sitting or standing blood pressure is within target range for patient.. pulse regular and within target range for patient.Marland Kitchen respirations regular, non- labored and within target range for patient.Marland Kitchen temperature within target range for patient.. Obese and well-hydrated in no acute distress. Eyes conjunctiva clear no eyelid edema noted. pupils equal round and reactive to light and accommodation. Ears, Nose, Mouth, and Throat no gross abnormality of ear auricles or external auditory canals. normal hearing noted  during conversation. mucus membranes moist. Respiratory normal breathing without difficulty. Cardiovascular 2+ dorsalis pedis/posterior tibialis pulses. trace pitting edema of the bilateral lower extremities. Musculoskeletal normal gait and posture. no significant deformity or arthritic changes, no loss or range of motion, no clubbing. Psychiatric this patient is able to make decisions and demonstrates good  insight into disease process. Alert and Oriented x 3. pleasant and cooperative. Notes Upon inspection patient's wound bed actually showed signs of good granulation and epithelization. There is actually lot of healing and he has minimal really swelling noted in his bilateral lower extremities compared to what I am used to seeing from him. His compression wraps are doing a great job and they have been doing excellent keeping things under control here. Electronic Signature(s) Signed: 02/05/2020 10:26:36 AM By: Worthy Keeler PA-C Entered By: Worthy Keeler on 02/05/2020 10:26:35 Adam Merritt ([id number]) -------------------------------------------------------------------------------- Physician Orders Details Patient Name: Adam Merritt Date of Service: 02/05/2020 9:15 AM Medical Record Number: [id number] Patient Account Number: [id number] Date of Birth/Sex: Sep 27, 1945 (74 y.o. M) Treating RN: Grover Canavan Primary Care Provider: [name] Other Clinician: Referring Provider: [name] Treating Provider/Extender: Melburn Hake, Qadir Folks [name]: 0 Verbal / Phone Orders: No Diagnosis Coding ICD-10 Coding Code Description I89.0 Lymphedema, not elsewhere classified I87.2 Venous insufficiency (chronic) (peripheral) L97.822 Non-pressure chronic ulcer of other part of left lower leg with fat layer exposed Wound Cleansing o Cleanse wound with mild soap and water Primary Wound Dressing Wound #36 Left,Anterior Lower Leg o Silver Alginate Secondary Dressing Wound #36  Left,Anterior Lower Leg o ABD and Kerlix/Conform Dressing Change Frequency Wound #36 Left,Anterior Lower Leg o Change dressing every other day. Follow-up Appointments Wound #36 Left,Anterior Lower Leg o Return Appointment in 2 weeks. Edema Control Wound #36 Left,Anterior Lower Leg o Patient to wear own Velcro compression garment. - on both lower legs o Elevate legs to the level of the heart and pump ankles as often as possible Electronic Signature(s) Signed: 02/05/2020 4:51:51 PM By: Grover Canavan Signed: 02/05/2020 5:25:12 PM By: Worthy Keeler PA-C Entered By: Grover Canavan on 02/05/2020 10:09:11 Adam Merritt ([id number]) -------------------------------------------------------------------------------- Problem List Details Patient Name: Adam Merritt Date of Service: 02/05/2020 9:15 AM Medical Record Number: [id number] Patient Account Number: [id number] Date of Birth/Sex: 1945-09-30 (74 y.o. M) Treating RN: Cornell Barman Primary Care Provider: [name] Other Clinician: Referring Provider: [name] Treating Provider/Extender: Melburn Hake, Garrett Mitchum [name]: 0 Active Problems ICD-10 Encounter Code Description Active Date MDM Diagnosis I89.0 Lymphedema, not elsewhere classified 02/05/2020 No Yes I87.2 Venous insufficiency (chronic) (peripheral) 02/05/2020 No Yes L97.822 Non-pressure chronic ulcer of other part of left lower leg with fat layer 02/05/2020 No Yes exposed Inactive Problems Resolved Problems Electronic Signature(s) Signed: 02/05/2020 10:00:31 AM By: Worthy Keeler PA-C Entered By: Worthy Keeler on 02/05/2020 10:00:30 Adam Merritt ([id number]) -------------------------------------------------------------------------------- Progress Note Details Patient Name: Adam Merritt Date of Service: 02/05/2020 9:15 AM Medical Record Number: [id number] Patient Account Number: [id number] Date of Birth/Sex: 1945-08-19 (74 y.o. M) Treating RN: Cornell Barman Primary Care Provider: [name] Other Clinician: Referring Provider: [name] Treating Provider/Extender: Melburn Hake, Velna Hedgecock [name]: 0 Subjective Chief Complaint Information obtained from Patient Left leg ulcers History of Present Illness (HPI) 10/18/17-He is here for initial evaluation of bilateral lower extremity ulcerations in the presence of venous insufficiency and lymphedema. He has been seen by vascular medicine in the past, Dr. Lucky Cowboy, last seen in 2016. He does have a history of abnormal ABIs, which is to be expected given his lymphedema and venous insufficiency. According to Epic, it appears that all attempts for arterial evaluation and/or angiography were not follow through with by patient. He does have a history of being seen in lymphedema clinic in 2018, stopped going approximately 6 months ago stating "  it didn't do any good". He does not have lymphedema pumps, he does not have custom fit compression wrap/stockings. He is diabetic and his recent A1c last month was 7.6. He admits to chronic bilateral lower extremity pain, no change in pain since blister and ulceration development. He is currently being treated with Levaquin for bronchitis. He has home health and we will continue. 10/25/17-He is here in follow-up evaluation for bilateral lower extremity ulcerationssubtle he remains on Levaquin for bronchitis. Right lower extremity with no evidence of drainage or ulceration, persistent left lower extremity ulceration. He states that home health has not been out since his appointment. He went to [facility] on Tuesday, studies revealed: RIGHT ABI 0.9, TBI 0.6 LEFT ABI 1.1, TBI 0.6 with triphasic flow bilaterally. We will continue his same treatment plan. He has been educated on compression therapy and need for elevation. He will benefit from lymphedema pumps 11/01/17-He is here in follow-up evaluation for left lower extremity ulcer. The right lower extremity  remains healed. He has home health services, but they have not been out to see the patient for 2-3 weeks. He states it home health physical therapy changed his dressing yesterday after therapy; he placed Ace wrap compression. We are still waiting for lymphedema pumps, reordered d/t need for company change. 11/08/17-He is here in follow-up evaluation for left lower extremity ulcer. It is improved. Edema is significantly improved with compression therapy. We will continue with same treatment plan and he will follow-up next week. No word regarding lymphedema pumps 11/15/17-He is here in follow-up evaluation for left lower extremity ulcer. He is healed and will be discharged from wound care services. I have reached out to [facility] regarding his lymphedema pumps. They have been unable to reach the patient; the contact number they had with the patient's wife's cell phone and she has not answered any unrecognized calls. Contact should be made today, trial planned for next week; [facility] will continue to follow 11/27/17 on evaluation today patient has multiple blistered areas over the right lower extremity his left lower extremity appears to be doing okay. These blistered areas show signs of no infection which is great news. With that being said he did have some necrotic skin overlying which was mechanically debrided away with saline and gauze today without complication. Overall post debridement the wounds appear to be doing better but in general his swelling seems to be increased. This is obviously not good news. I think this is what has given rise to the blisters. 12/04/17 on evaluation today patient presents for follow-up concerning his bilateral lower extremity edema in the right lower extremity ulcers. He has been tolerating the dressing changes without complication. With that being said he has had no real issues with the wraps which is also good news. Overall I'm pleased with the progress  he's been making. 12/11/17 on evaluation today patient appears to be doing rather well in regard to his right lateral lower extremity ulcer. He's been tolerating the dressing changes without complication. Fortunately there does not appear to be any evidence of infection at this time. Overall I'm pleased with the progress that is being made. Unfortunately he has been in the hospital due to having what sounds to be a stomach virus/flu fortunately that is starting to get better. 12/18/17 on evaluation today patient actually appears to be doing very well in regard to his bilateral lower extremities the swelling is under fairly good control his lymphedema pumps are still not up and  running quite yet. With that being said he does have several areas of opening noted as far as wounds are concerned mainly over the left lower extremity. With that being said I do believe once he gets lymphedema pumps this would least help you mention some the fluid and preventing this from occurring. Hopefully that will be set up soon sleeves are Artie in place at his home he just waiting for the machine. 12/25/17 on evaluation today patient actually appears to be doing excellent in fact all of his ulcers appear to have resolved his legs appear very well. I do think he needs compression stockings we have discussed this and they are actually going to go to [place] today to elastic therapy to get this fitted for him. I think that is definitely a good thing to do. Readmission: 04/09/18 upon evaluation today this patient is seen for readmission due to bilateral lower extremity lymphedema. He has significant swelling of his extremities especially on the left although the right is also swollen he has weeping from both sides. There are no obvious open wounds at this point. Fortunately he has been doing fairly well for quite a bit of time since I last saw him. Nonetheless unfortunately this seems to have reopened and is giving quite a bit  of trouble. He states this began about a week ago when he first called [place] to get in to be seen. No fevers, chills, nausea, or vomiting noted at this time. He has not been using his lymphedema pumps due to the fact that they won't fit on his leg at this point likewise is also not been using his compression for essentially the same reason. 04/16/18 upon evaluation today patient actually appears to be doing a little better in regard to the fluid in his bilateral lower extremities. With that being said he's had three falls since I saw him last week. He also states that he's been feeling very poorly. I was concerned last week and feel like that the concern is still there as far as the congestion in his chest is concerned he seems to be breathing about the same as last week but again he states he's very weak he's not even able to walk further than from the chair to the door. His wife had to buy a wheelchair just to be able to get them out of the house to get to the appointment today. This has me very concerned. ALVIE, FOWLES ([id number]) 04/23/18 on evaluation today patient actually appears to be doing much better than last week's evaluation. At that time actually had to transport him to the ER via EMS and he subsequently was admitted for acute pulmonary edema, acute renal failure, and acute congestive heart failure. Fortunately he is doing much better. Apparently they did dialyze him and were able to take off roughly 35 pounds of fluid. Nonetheless he is feeling much better both in regard to his breathing and he's able to get around much better at this time compared to previous. Overall I'm very happy with how things are at this time. There does not appear to be any evidence of infection currently. No fevers, chills, nausea, or vomiting noted at this time. 04/30/2018 patient seen today for follow-up and management of bilateral lower extremity lymphedema. He did express being more sad today than usual due  to the recent loss of his dog. He states that he has been compliant with using the lymphedema pumps. However he does admit a minute over the last  2-3 days he has not been using the pumps due to the recent loss of his dog. At this time there is no drainage or open wounds to his lower extremities. The left leg edema is measuring smaller today. Still has a significant amount of edema on bilateral lower extremities With dry flaky skin. He will be referred to the lymphedema clinic for further management. Will continue 3 layer compression wraps and follow-up in 1-2 weeks.Denies any pain, fever, chairs recently. No recent falls or injuries reported during this visit. 05/07/18 on evaluation today patient actually appears to be doing very well in regard to his lower extremities in general all things considered. With that being said he is having some pain in the legs just due to the amount of swelling. He does have an area where he had a blister on the left lateral lower extremity this is open at this point other than that there's nothing else weeping at this time. 05/14/18 on evaluation today patient actually appears to be doing excellent all things considered in regard to his lower extremities. He still has a couple areas of weeping on each leg which has continued to be the issue for him. He does have an appointment with the lymphedema clinic although this isn't until February 2020. That was the earliest they had. In the meantime he has continued to tolerate the compression wraps without complication. 05/28/18 on evaluation today patient actually appears to be doing more poorly in regard to his left lower extremity where he has a wound open at this point. He also had a fall where he subsequently injured his right great toe which has led to an open wound at the site unfortunately. He has been tolerating the dressing changes without complication in general as far as the wraps are concerned that he has not been  putting any dressing on the left 1st toe ulcer site. 06/11/18 on evaluation today patient appears to be doing much worse in regard to his bilateral lower extremity ulcers. He has been tolerating the dressing changes without complication although his legs have not been wrapped more recently. Overall I am not very pleased with the way his legs appear. I do believe he needs to be back in compression wraps he still has not received his compression wraps from the [facility] as of yet. 06/18/18 on evaluation today patient actually appears to be doing significantly better than last time I saw him. He has been tolerating the compression wraps without complication in the circumferential ulcers especially appear to be doing much better. His toe ulcer on the right in regard to the great toe is better although not as good as the legs in my pinion. No fevers chills noted 07/02/18 on evaluation today patient appears to be doing much better in regard to his lower extremity ulcers. Unfortunately since I last saw him he's had the distal portion of his right great toe if he dated it sounds as if this actually went downhill very quickly. I had only seen him a few days prior and the toe did not appear to be infected at that point subsequently became infected very rapidly and it was decided by the surgeon that the distal portion of the toe needed to be removed. The patient seems to be doing well in this regard he tells me. With that being said his lower extremities are doing better from the standpoint of the wounds although he is significantly swollen at this point. 07/09/18 on evaluation today patient appears to  be doing better in regard to the wounds on his lower extremities. In fact everything is almost completely healed he is just a small area on the left posterior lower extremity that is open at this point. 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 With that being said he needs some kind of compression it does sound like he may not be wearing his compression, that is the wraps, during the entire time between when he's here visit to visit. Apparently his wife took the current one off because it began to "fall apart". 07/16/18 on evaluation today patient appears to be extremely swollen especially in regard to his left lower extremity unfortunately. He also has a new skin tear over the left lower extremity and there's a smaller area on the right lower extremity as well. Unfortunately this seems to be due in part to blistering and fluid buildup in his leg. He did get the reduction wraps that were ordered by the [facility] for him to go to lymphedema clinic. With that being said his wounds on the legs have not healed to the point to where they would likely accept them as a patient lymphedema clinic currently. We need to try to get this to heal. With that being said he's been taking his wraps off which is not doing him any favors at this point. In fact this is probably quite counterproductive compared to what needs to occur. We will likely need to increase to a four layer compression wrap and continue to also utilize elevation and he has to keep the wraps on not take them off as he's been doing currently hasn't had a wrap on since Saturday. 07/23/18 on evaluation today patient appears to be doing much better in regard to his bilateral lower extremities. In fact his left lower extremity which was the largest is actually 15 cm smaller today compared to what it was last time he was here in our clinic. This is obviously good news after just one week. Nonetheless the differences he actually kept the wraps on during the entire week this time. That's not typical for him. I do believe he understands a little bit better now the severity of the  situation and why it's important for him to keep these wraps on. 07/30/18 on evaluation today patient actually appears to be doing rather well in regard to his lower extremities. His legs are much smaller than they have been in the past and he actually has only one very small rudder superficial region remaining that is not closed on the left lateral/posterior lower extremity even this is almost completely close. I do believe likely next week he will be healed without any complications. I do think we need to continue the wraps however this seems to be beneficial for him. I also think it may be a good time for [place] to go ahead and see about getting the appointment with the lymphedema clinic which is supposed to be made for him in order to keep this moving along and hopefully get them into compression wraps that will in the end help him to remain healed. 08/06/18 on evaluation today patient actually appears to be doing very well in regard as bilateral lower extremities. In fact his wounds appear to be completely healed at this time. 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 He is in the process of getting appointment with the lymphedema clinic we have made this referral were just waiting to hear back on the schedule time. We need to follow up on that today as well. 08/13/18 on evaluation today patient actually appears to be doing very well in regard to his bilateral lower extremities there are no open wounds at this point. We are gonna go ahead and see about ordering the Velcro compression wraps for him had a discussion with them about [place] doing it versus the [place] they feel like they can definitely afford going ahead and get the wraps themselves and they would prefer to try to avoid having to go to the lymphedema clinic if it all possible which I completely understand. As long as he has good compression I'm okay either way. BERT, PTACEK  ([id number]) 3/17//20 on evaluation today patient actually appears to be doing well in regard to his bilateral lower extremity ulcers. He has been tolerating the dressing changes without complication specifically the compression wraps. Overall is had no issues my fingers finding that I see at this point is that he is having trouble with constipation. He tells me he has not been able to go to the bathroom for about six days. He's taken to over-the- counter oral laxatives unfortunately this is not helping. He has not contacted his doctor. 08/27/18 on evaluation today patient appears to be doing fairly well in regard to his lower extremities at this point. There does not appear to be any new altars is swelling is very well controlled. We are still waiting for his Velcro compression wraps to arrive that should be sometime in the next week is Artie sent the check for these. 09/03/18 on evaluation today patient appears to be doing excellent in regard to his bilateral lower extremities which he shows no signs of wound openings in regard to this point. He does have his Velcro compression wraps which did arrive in the mail since I saw him last week. Overall he is doing excellent in my pinion. 09/13/18 on evaluation today patient appears to be doing very well currently in regard to the overall appearance of his bilateral lower extremities although he's a little bit more swollen than last time we saw him. At that point he been discharged without any open wounds. Nonetheless he has a small open wound on the posterior left lower extremity with some evidence of cellulitis noted as well. Fortunately I feel like he has made good progress overall with regard to his lower extremities from were things used to be. 09/20/18 on evaluation today patient actually appears to be doing much better. The erythematous lower extremity is improving wound itself which is still open appears to be doing much better as far as but appearance  as well as pain is concerned overall very pleased in this regard. There's no signs of active infection at this time. 09/27/18 on evaluation today patient's wounds on the lower extremity actually appear to be doing fairly well at this time which is good news. There is no evidence of active infection currently and again is just as left lower extremity were there any wounds at this point anyway. I believe they may be completely healed but again I'm not 100% sure based on evaluation today. I think one more week of observation would likely be a good idea. 10/04/18 on evaluation today patient actually appears to be doing excellent in regard to the left lower  extremity which actually appears to be completely healed as of today. Unfortunately he's been having issues with his right lower extremity have a new wound that has opened. Fortunately there's no evidence of active infection at this time which is good news. 10/10/18 upon evaluation today patient actually appears to be doing a little bit worse with the new open area on his right posterior lower extremity. He's been tolerating the dressing changes without complication. Right now we been using his compression wraps although I think we may need to switch back to actually performing bilateral compression wraps are in the clinic. No fevers, chills, nausea, or vomiting noted at this time. 10/17/18 on evaluation today patient actually appears to be doing quite well in regard to his bilateral lower extremity ulcers. He has been tolerating the reps without complication although he would prefer not to rewrap his legs as of today. Fortunately there's no signs of active infection which is good news. No fevers, chills, nausea, or vomiting noted at this time. 10/24/18 on evaluation today patient appears to be doing very well in regard to his lower extremities. His right lower extremity is shown signs of healing and his left lower Trinity though not healed appears to be  improving which is excellent news. Overall very pleased with how things seem to be progressing at this time. The patient is likewise happy to hear this. His Velcro compression wraps however have not been put on properly will gonna show his wife how to do that properly today. 10/31/18 on evaluation today patient appears to be doing more poorly in regard to his left lower extremity in particular although both lower extremities actually are showing some signs of being worse than my previous evaluation. Unfortunately I'm just not sure that his compression stockings even with the use of the compression/lymphedema pumps seem to be controlling this well. Upon further questioning he tells me that he also is not able to lie flat in the bed due to his congestive heart failure and difficulty breathing. For that reason he sleeps in his recliner. To make matters worse his recliner also cannot even hold his legs up so instead of even being somewhat elevated they pretty much hang down to the floor. This is the way he sleeps each night which is definitely counterproductive to everything else that were attempting to do from the standpoint of controlling his fluid. Nonetheless I think that he potentially could benefit from a hospital bed although this would be something that his primary care provider would likely have to order since anything that is order on our side has to be directly related to wound care and again the hospital bed is not necessarily a direct relation to although I think it does contribute to his overall wound status on his lower extremities. 11/07/18 on evaluation today patient appears to be doing better in regard to his bilateral lower extremity. He's been tolerating the dressing changes without complication. We did in the interim since I last saw him switch to just using extras orbiting new alginate and that seems to have done much better for him. I'm very pleased with the overall progress that is  made. 11/14/18 on evaluation today patient appears to be redoing rather well in regard to his left lower extremity ulcers which are the only ones remaining at this point. Fortunately there's no signs of active infection at this time which is good news. Overall been very pleased with how things seem to be progressing currently. No fevers, chills, nausea,  or vomiting noted at this time. 11/20/17 on evaluation today patient actually appears to be doing quite well in regard to his lower extremities on the left on the right he has several blisters that showed up although there's some question about whether or not he has had his broker compression wraps on like he was supposed to or not. Fortunately there's no signs of active infection at this time which is good news. Unfortunately though he is doing well from the standpoint of the left leg the right leg is not doing as well again this is when we did not wrap last week. 11/28/18 on evaluation today patient appears to be doing well in regard to his bilateral lower extremities is left appears to be healed is right is not healed but is very close to being so. Overall very pleased with how things seem to be progressing. Patient is likewise happy that things are doing well. 12/09/18 on evaluation today patient actually appears to be completely healed which is excellent news. He actually seems to be doing well in regard to the swelling of the bilateral lower extremities which is also great news. Overall very pleased with how things seem to be progressing. Readmission: 03/18/2019 upon evaluation today patient presents for reevaluation concerning issues that he is having with his left lower extremity where he does have a wound noted upon inspection today. He also has a wound on the right second toe on the tip where it is apparently been rubbing in his shoe I did look at issues and you can actually see where this has been occurring as well. Fortunately there is no signs  of infection with regard to TAYDEN, NICHELSON. ([id number]) the toe necessarily although it is very swollen compared to normal that does have me somewhat concerned about the possibility of further evaluation for infection/osteomyelitis. I would recommend an x-ray to start with. Otherwise he states that it is been 1-2 weeks that he has had the draining in regard to left lower extremity he does not know how this happened he has been wearing his compression appropriately and I do see that as well today but nonetheless I do think that he needs to continue to be very cautious with regard to elevation as well. 03/25/2019 on evaluation today patient appears to be doing much better in regard to his lower extremity at this time. That is on the left. He did culture positive for Pseudomonas but the good news is he seems to be doing much better the gentamicin we applied topically seems to have done a great job for him. There is no signs of active infection at this time systemically and locally he is doing much better. No fevers, chills, nausea, vomiting, or diarrhea. In regard to his right toe this seems to be doing much better there is very little pressure noted at this point I did clean up some of the callus today around the edges of the wound as well as the surface of the wound but to be honest he is progressing quite nicely. 10/30; the area on the left anterior lower tibia area is healed over. His edema control is adequate even though his use of the compression pumps seems very intermittent. He has a juxta lite stocking on the right leg and a think there is 1 for the left leg at home. His wife is putting these on. He has an area on the plantar right second toe which is a hammertoe they are trying to offload this 04/08/2019  on evaluation today patient appears to be doing well in regard to his lower extremities which is good news. We are seeing him today for his toe ulcer which is giving him a little bit more  trouble but again still seems to be doing better at this point which is good news. He is going require some sharp debridement in regard to the toe today however. 04/15/2019 on evaluation today patient actually appears to be doing quite well with regard to his toe ulcer. I am very pleased with how things are doing in that regard. With regard to his lower extremity edema in general he tells me he is not been using the lymphedema pumps regularly although he does not use them. I think that he needs to use them more regularly he does have a small blister on the left lower extremity this is not open at this point but obviously it something that can get worse if he does not keep the compression going and the pumps going as well. 04/22/2019 on evaluation today patient appears to be doing well with regard to his toe ulcer. He has been tolerating the dressing changes without complication. This is measuring slightly smaller this week as compared to last week. Fortunately there is no signs of active infection at this time. 05/06/2019 on evaluation today patient actually appears to be doing excellent. In fact his toe appears to be completely healed which is great news. There is no signs of active infection at this time. Readmission: Patient presents today for follow-up after having been discharged at the beginning of December 2020. He states that in recent weeks things have reopened and has been having more trouble at this point. With that being said fortunately there is no signs of active infection at this time. No fever chills noted. Nonetheless he also does have an issue with one of his toes as well that seems to be somewhat this is on the right foot second toe. 07/25/2019 upon evaluation today patient's lower extremities appear to be doing excellent bilaterally. I really feel like he is showing signs of great improvement and in fact I think he is close to healing which is great news. There is no signs of active  infection at this time. No fevers, chills, nausea, vomiting, or diarrhea. 07/31/2019 upon evaluation today patient appears to be doing excellent and in fact I think may be completely healed in regard to his right lower extremity that were still to monitor this 1 more week before closing it out. The left lower extremity is measuring smaller although not completely closed seems to be doing excellent. 08/07/2019 upon evaluation today patient appears to be doing excellent in regard to his wounds. In fact the right lower extremity is completely healed there is no issues here. The left lower extremity has 1 very small area still remaining fortunately there is no signs of infection and overall I think he is very close to closure of the here as well. 08/14/19 upon evaluation today patient appears to be doing excellent in regard to his lower extremities. In fact he appears to be completely healed as of today. Fortunately there's no signs of active infection at this time. No fevers, chills, nausea, or vomiting noted at this time. READMISSION 09/26/2019 This is a 74 year old man who is well-known to this clinic having just been discharged on 08/14/2019. He has known lymphedema and chronic venous insufficiency. He has Farrow wrap stockings and also external compression pumps. He does not use the external compression  pumps however. He does however apparently fairly faithfully use the stockings. They have not been lotion in his legs. He has developed new wounds on the right anterior as well as the left medial left lateral and left posterior calf. He returns for our review of this Past medical history includes coronary artery disease, hypertension, congestive heart failure, COPD, venous insufficiency with lymphedema, type 2 diabetes. He apparently has compression pumps, Farrow wraps and at 1 point a hospital bed although I believe he sleeps in a recliner 10/03/2019 upon evaluation today patient appears to be doing  better with regard to his wounds in general. He has been tolerating the dressing changes without complication. Fortunately there is no signs of active infection. No fevers, chills, nausea, vomiting, or diarrhea. I do believe the compression wraps are helping as they always have in the past. 10/10/2019 upon evaluation today patient appears to be doing excellent at this point. His right lower extremity is measuring much better and in fact is completely healed. His left lower extremity is also measuring better though not healed seems to be doing excellent at this point which is great news. Overall I am very pleased with how things appear currently. 10/27/2019 upon evaluation today patient appears to be doing quite well with regard to his wounds currently. He has been tolerating the dressing changes without complication. Fortunately there is no signs of active infection at this time. In fact he is almost completely healed on the left and has just a very small area open and on the right he is completely healed. 11/04/2019 upon evaluation today patient appears to be doing quite well with regard to his wounds. In fact he appears to be quite possibly completely healed on the left lower extremity now as well the right lower extremity is still doing well. With that being said unfortunately though this may be healed I think it is a very fragile area and I cannot even confirm there is not a very small opening still remaining. I really think he may benefit from 1 additional weeks wrapping before discontinuing. NYHEEM, BINETTE ([id number]) 11/10/2019 upon evaluation today patient appears to be doing well in regard to his original wounds in fact everything that we were treating last week is completely healed on the left. Unfortunately he has a new skin tear just above where the wrap slipped down due to a fall he sustained causing this injury. 11/17/2019 on evaluation today patient appears to be doing better with regard to  his wound. He has been tolerating the dressing changes without complication. Fortunately there is no signs of active infection at this time. No fevers, chills, nausea, vomiting, or diarrhea. 11/24/2019 upon evaluation today patient appears to be doing well with regard to his wound. This is making good progress there does not appear to be signs of active infection but overall I do feel like he is headed in the correct direction. Overall he is tolerating the compression wrap as well. 12/02/2019 upon evaluation today patient appears to be doing well with regard to his leg ulcer. In fact this appears to be completely healed and this is the last of the wounds that he had but still the left anterior lower extremity. Fortunately there is no signs of active infection at this time. No fevers, chills, nausea, vomiting, or diarrhea. Readmission: 02/05/2020 on evaluation today patient appears to be doing well with regard to his wound all things considered. He actually tells me that a couple weeks ago he had trouble with  his wraps and therefore took him off for a couple of days in order to give his legs a break. It was during this time that he actually developed increased swelling and edema of his legs and blisters on both legs. That is when he called to make the appointment. Nonetheless since that time he began wearing his compression wraps which are Velcro in the meantime and has done extremely well with this. In fact his leg appears to be under good control as far as edema is concerned today it is nothing like what I am seeing when he has come in for evaluations in the past. They have been using some of the silver alginate at home which has been beneficial for him. Fortunately there is no signs of active infection at this time. No fevers, chills, nausea, vomiting, or diarrhea. Patient History Information obtained from Patient. Allergies Corticosteroids (Glucocorticoids) Family History Cancer - Paternal  Grandparents, Kidney Disease - Siblings, Lung Disease - Mother, No family history of Diabetes, Heart Disease, Hereditary Spherocytosis, Hypertension, Seizures, Stroke, Thyroid Problems, Tuberculosis. Social History Never smoker, Marital Status - Married, Alcohol Use - Never, Drug Use - No History, Caffeine Use - Never. Medical History Eyes Denies history of Cataracts, Glaucoma, Optic Neuritis Ear/Nose/Mouth/Throat Denies history of Chronic sinus problems/congestion, Middle ear problems Hematologic/Lymphatic Patient has history of Anemia - aplastic anemia, Lymphedema Denies history of Hemophilia, Human Immunodeficiency Virus, Sickle Cell Disease Respiratory Patient has history of Chronic Obstructive Pulmonary Disease (COPD), Sleep Apnea Denies history of Aspiration, Asthma, Pneumothorax, Tuberculosis Cardiovascular Patient has history of Congestive Heart Failure, Coronary Artery Disease, Hypertension, Peripheral Venous Disease Denies history of Angina, Arrhythmia, Deep Vein Thrombosis, Hypotension, Myocardial Infarction, Peripheral Arterial Disease, Phlebitis, Vasculitis Gastrointestinal Denies history of Cirrhosis , Colitis, Crohn s, Hepatitis A, Hepatitis B, Hepatitis C Endocrine Patient has history of Type II Diabetes Denies history of Type I Diabetes Genitourinary Denies history of End Stage Renal Disease Immunological Denies history of Lupus Erythematosus, Raynaud s, Scleroderma Integumentary (Skin) Denies history of History of Burn, History of pressure wounds Musculoskeletal Patient has history of Osteoarthritis Denies history of Gout, Rheumatoid Arthritis, Osteomyelitis Neurologic Patient has history of Neuropathy Denies history of Dementia, Quadriplegia, Paraplegia, Seizure Disorder Oncologic Patient has history of Received Chemotherapy - last dose 2006 Denies history of Received Radiation Psychiatric Denies history of Anorexia/bulimia, Confinement  Anxiety Hospitalization/Surgery History - bronchitis. Medical And Surgical History Notes Respiratory DURAND, WITTMEYER ([id number]) home O2 at night as needed Cardiovascular varicose veins Neurologic CVA - 1992 Objective Constitutional sitting or standing blood pressure is within target range for patient.. pulse regular and within target range for patient.Marland Kitchen respirations regular, non- labored and within target range for patient.Marland Kitchen temperature within target range for patient.. Obese and well-hydrated in no acute distress. Vitals Time Taken: 9:27 AM, Height: 65 in, Weight: 289 lbs, BMI: 48.1, Temperature: 98.3 F, Pulse: 66 bpm, Respiratory Rate: 20 breaths/min, Blood Pressure: 143/67 mmHg. Eyes conjunctiva clear no eyelid edema noted. pupils equal round and reactive to light and accommodation. Ears, Nose, Mouth, and Throat no gross abnormality of ear auricles or external auditory canals. normal hearing noted during conversation. mucus membranes moist. Respiratory normal breathing without difficulty. Cardiovascular 2+ dorsalis pedis/posterior tibialis pulses. trace pitting edema of the bilateral lower extremities. Musculoskeletal normal gait and posture. no significant deformity or arthritic changes, no loss or range of motion, no clubbing. Psychiatric this patient is able to make decisions and demonstrates good insight into disease process. Alert and Oriented x 3. pleasant and cooperative.  General Notes: Upon inspection patient's wound bed actually showed signs of good granulation and epithelization. There is actually lot of healing and he has minimal really swelling noted in his bilateral lower extremities compared to what I am used to seeing from him. His compression wraps are doing a great job and they have been doing excellent keeping things under control here. Integumentary (Hair, Skin) Wound #36 status is Open. Original cause of wound was Gradually Appeared. The wound is located on  the Left,Anterior Lower Leg. The wound measures 0.7cm length x 0.5cm width x 0.1cm depth; 0.275cm^2 area and 0.027cm^3 volume. There is Fat Layer (Subcutaneous Tissue) exposed. There is no tunneling or undermining noted. There is a medium amount of serous drainage noted. The wound margin is flat and intact. There is no granulation within the wound bed. There is a large (67-100%) amount of necrotic tissue within the wound bed including Adherent Slough. Other Condition(s) Patient presents with Lymphedema located on the Bilateral Leg. General Notes: Patient's left leg has multiple small spots of weeping on the anterior portion of the leg. Assessment Active Problems ICD-10 Lymphedema, not elsewhere classified Venous insufficiency (chronic) (peripheral) Non-pressure chronic ulcer of other part of left lower leg with fat layer exposed THEODOROS, STJAMES ([id number]) Plan Wound Cleansing: Cleanse wound with mild soap and water Primary Wound Dressing: Wound #36 Left,Anterior Lower Leg: Silver Alginate Secondary Dressing: Wound #36 Left,Anterior Lower Leg: ABD and Kerlix/Conform Dressing Change Frequency: Wound #36 Left,Anterior Lower Leg: Change dressing every other day. Follow-up Appointments: Wound #36 Left,Anterior Lower Leg: Return Appointment in 2 weeks. Edema Control: Wound #36 Left,Anterior Lower Leg: Patient to wear own Velcro compression garment. - on both lower legs Elevate legs to the level of the heart and pump ankles as often as possible 1. I would recommend currently that we go ahead and continue with the wound care measures as before with regard to the silver alginate that is what has been using at home I think that would do well at this time. 2. I am also can recommend that he continue with his Wallie Char basic wrap which also seems to be doing well for him. 3. I am also can recommend the patient continue to elevate his legs much as possible to try to keep edema under good  control. We will see patient back for reevaluation in 2 weeks here in the clinic. If anything worsens or changes patient will contact our office for additional recommendations. Electronic Signature(s) Signed: 02/05/2020 10:27:43 AM By: Worthy Keeler PA-C Entered By: Worthy Keeler on 02/05/2020 10:27:42 Adam Merritt ([id number]) -------------------------------------------------------------------------------- ROS/PFSH Details Patient Name: Adam Merritt Date of Service: 02/05/2020 9:15 AM Medical Record Number: [id number] Patient Account Number: [id number] Date of Birth/Sex: 1946/04/18 (74 y.o. M) Treating RN: Grover Canavan Primary Care Provider: [name] Other Clinician: Referring Provider: [name] Treating Provider/Extender: Melburn Hake, Irfan Veal [name]: 0 Information Obtained From Patient Eyes Medical History: Negative for: Cataracts; Glaucoma; Optic Neuritis Ear/Nose/Mouth/Throat Medical History: Negative for: Chronic sinus problems/congestion; Middle ear problems Hematologic/Lymphatic Medical History: Positive for: Anemia - aplastic anemia; Lymphedema Negative for: Hemophilia; Human Immunodeficiency Virus; Sickle Cell Disease Respiratory Medical History: Positive for: Chronic Obstructive Pulmonary Disease (COPD); Sleep Apnea Negative for: Aspiration; Asthma; Pneumothorax; Tuberculosis Past Medical History Notes: home O2 at night as needed Cardiovascular Medical History: Positive for: Congestive Heart Failure; Coronary Artery Disease; Hypertension; Peripheral Venous Disease Negative for: Angina; Arrhythmia; Deep Vein Thrombosis; Hypotension; Myocardial Infarction; Peripheral Arterial Disease; Phlebitis; Vasculitis Past Medical  History Notes: varicose veins Gastrointestinal Medical History: Negative for: Cirrhosis ; Colitis; Crohnos; Hepatitis A; Hepatitis B; Hepatitis C Endocrine Medical History: Positive for: Type II Diabetes Negative for: Type I  Diabetes Time with diabetes: since 2006 Treated with: Insulin Blood sugar tested every day: Yes Tested : Blood sugar testing results: Breakfast: 209 Genitourinary Medical History: Negative for: End Stage Renal Disease Immunological WOODSON, MACHA ([id number]) Medical History: Negative for: Lupus Erythematosus; Raynaudos; Scleroderma Integumentary (Skin) Medical History: Negative for: History of Burn; History of pressure wounds Musculoskeletal Medical History: Positive for: Osteoarthritis Negative for: Gout; Rheumatoid Arthritis; Osteomyelitis Neurologic Medical History: Positive for: Neuropathy Negative for: Dementia; Quadriplegia; Paraplegia; Seizure Disorder Past Medical History Notes: CVA - 1992 Oncologic Medical History: Positive for: Received Chemotherapy - last dose 2006 Negative for: Received Radiation Psychiatric Medical History: Negative for: Anorexia/bulimia; Confinement Anxiety Immunizations Pneumococcal Vaccine: Received Pneumococcal Vaccination: Yes Implantable Devices None Hospitalization / Surgery History Type of Hospitalization/Surgery bronchitis Family and Social History Cancer: Yes - Paternal Grandparents; Diabetes: No; Heart Disease: No; Hereditary Spherocytosis: No; Hypertension: No; Kidney Disease: Yes - Siblings; Lung Disease: Yes - Mother; Seizures: No; Stroke: No; Thyroid Problems: No; Tuberculosis: No; Never smoker; Marital Status - Married; Alcohol Use: Never; Drug Use: No History; Caffeine Use: Never; Financial Concerns: No; Food, Clothing or Shelter Needs: No; Support System Lacking: No; Transportation Concerns: No Electronic Signature(s) Signed: 02/05/2020 4:51:51 PM By: Grover Canavan Signed: 02/05/2020 5:25:12 PM By: Worthy Keeler PA-C Entered By: Grover Canavan on 02/05/2020 22:63:33 HEITOR, STEINHOFF ([id number]) -------------------------------------------------------------------------------- [facility] Details Patient Name: Adam Merritt Date of Service: 02/05/2020 Medical Record Number: [id number] Patient Account Number: [id number] Date of Birth/Sex: 06/04/1946 (74 y.o. M) Treating RN: Grover Canavan Primary Care Provider: [name] Other Clinician: Referring Provider: [name] Treating Provider/Extender: Melburn Hake, Daysen Gundrum [name]: 0 Diagnosis Coding ICD-10 Codes Code Description I89.0 Lymphedema, not elsewhere classified I87.2 Venous insufficiency (chronic) (peripheral) L97.822 Non-pressure chronic ulcer of other part of left lower leg with fat layer exposed Facility Procedures CPT4 Code: 81157262 Description: [facility] VISIT-LEV 3 EST PT Modifier: Quantity: 1 Physician Procedures CPT4 Code: 0355974 Description: 16384 - WC PHYS LEVEL 3 - EST PT Modifier: Quantity: 1 CPT4 Code: Description: ICD-10 Diagnosis Description I89.0 Lymphedema, not elsewhere classified I87.2 Venous insufficiency (chronic) (peripheral) L97.822 Non-pressure chronic ulcer of other part of left lower leg with fat lay Modifier: er exposed Quantity: Electronic Signature(s) Signed: 02/05/2020 10:28:24 AM By: Worthy Keeler PA-C Entered By: Worthy Keeler on 02/05/2020 10:28:23

## 2020-02-06 NOTE — Progress Notes (Signed)
DANTE, ROUDEBUSH ([id number]) Visit Report for 02/05/2020 Abuse/Suicide Risk Screen Details Patient Name: Adam Merritt, Adam Merritt Date of Service: 02/05/2020 9:15 AM Medical Record Number: [id number] Patient Account Number: [id number] Date of Birth/Sex: 23-Sep-1945 (74 y.o. M) Treating RN: Grover Canavan Primary Care Jahmeer Porche: [name] Other Clinician: Referring Charman Blasco: [name] Treating Ayslin Kundert/Extender: Melburn Hake, [name]: 0 Abuse/Suicide Risk Screen Items Answer ABUSE RISK SCREEN: Has anyone close to you tried to hurt or harm you recentlyo No Do you feel uncomfortable with anyone in your familyo No Has anyone forced you do things that you didnot want to doo No Electronic Signature(s) Signed: 02/05/2020 4:51:51 PM By: Grover Canavan Entered By: Grover Canavan on 02/05/2020 09:29:49 Adam Merritt ([id number]) -------------------------------------------------------------------------------- Activities of Daily Living Details Patient Name: Adam Merritt Date of Service: 02/05/2020 9:15 AM Medical Record Number: [id number] Patient Account Number: [id number] Date of Birth/Sex: [birth year] (74 y.o. M) Treating RN: Grover Canavan Primary Care Rishit Burkhalter: [name] Other Clinician: Referring Kashena Novitski: [name] Treating Claxton Levitz/Extender: Melburn Hake, [name]: 0 Activities of Daily Living Items Answer Activities of Daily Living (Please select one for each item) Drive Automobile Not Able Take Medications Need Assistance Use Telephone Completely Able Care for Appearance Need Assistance Use Toilet Need Assistance Bath / Shower Need Assistance Dress Self Need Assistance Feed Self Need Assistance Walk Need Assistance Get In / Out Bed Need Assistance Housework Need Assistance Prepare Meals Need Assistance Handle Money Need Assistance Shop for Self Need Assistance Electronic Signature(s) Signed: 02/05/2020 4:51:51 PM By: Grover Canavan Entered By:  Grover Canavan on 02/05/2020 09:30:11 Adam Merritt ([id number]) -------------------------------------------------------------------------------- Education Screening Details Patient Name: Adam Merritt Date of Service: 02/05/2020 9:15 AM Medical Record Number: [id number] Patient Account Number: [id number] Date of Birth/Sex: 1945-10-10 (74 y.o. M) Treating RN: Grover Canavan Primary Care Dreana Britz: [name] Other Clinician: Referring Michol Emory: [name] Treating Margart Zemanek/Extender: Melburn Hake, [name]: 0 Primary Learner Assessed: Patient Learning Preferences/Education Level/Primary Language Learning Preference: Explanation, Demonstration Highest Education Level: High School Preferred Language: English Cognitive Barrier Language Barrier: No Translator Needed: No Memory Deficit: No Emotional Barrier: No Physical Barrier Impaired Vision: Yes Impaired Hearing: Yes hard of hearing Knowledge/Comprehension Knowledge Level: Medium Comprehension Level: Medium Ability to understand written instructions: Medium Ability to understand verbal instructions: Medium Motivation Anxiety Level: Calm Cooperation: Cooperative Education Importance: Acknowledges Need Interest in Health Problems: Asks Questions Perception: Coherent Willingness to Engage in Self-Management High Activities: Readiness to Engage in Self-Management High Activities: Electronic Signature(s) Signed: 02/05/2020 4:51:51 PM By: Grover Canavan Entered By: Grover Canavan on 02/05/2020 09:30:50 Adam Merritt ([id number]) -------------------------------------------------------------------------------- Fall Risk Assessment Details Patient Name: Adam Merritt Date of Service: 02/05/2020 9:15 AM Medical Record Number: [id number] Patient Account Number: [id number] Date of Birth/Sex: 05/22/1946 (74 y.o. M) Treating RN: Grover Canavan Primary Care Shalene Gallen: [name] Other Clinician: Referring  Floy Riegler: [name] Treating Alarik Radu/Extender: Melburn Hake, [name]: 0 Fall Risk Assessment Items Have you had 2 or more falls in the last 12 monthso 0 No Have you had any fall that resulted in injury in the last 12 monthso 0 No FALLS RISK SCREEN History of falling - immediate or within 3 months 0 No Secondary diagnosis (Do you have 2 or more medical diagnoseso) 0 No Ambulatory aid None/bed rest/wheelchair/nurse 0 No Crutches/cane/walker 15 Yes Furniture 30 Yes Intravenous therapy Access/Saline/Heparin Lock 0 No Gait/Transferring Normal/ bed rest/ wheelchair 0 No Weak (short steps with or without shuffle, stooped but able  to lift head while walking, may 10 Yes seek support from furniture) Impaired (short steps with shuffle, may have difficulty arising from chair, head down, impaired 0 No balance) Mental Status Oriented to own ability 0 No Electronic Signature(s) Signed: 02/05/2020 4:51:51 PM By: Grover Canavan Entered By: Grover Canavan on 02/05/2020 09:31:13 Adam Merritt ([id number]) -------------------------------------------------------------------------------- Foot Assessment Details Patient Name: Adam Merritt Date of Service: 02/05/2020 9:15 AM Medical Record Number: [id number] Patient Account Number: [id number] Date of Birth/Sex: 07/30/1945 (74 y.o. M) Treating RN: Grover Canavan Primary Care Marilene Vath: [name] Other Clinician: Referring Sumiye Hirth: [name] Treating Adali Pennings/Extender: Melburn Hake, [name]: 0 Foot Assessment Items Site Locations + = Sensation present, - = Sensation absent, C = Callus, U = Ulcer R = Redness, W = Warmth, M = Maceration, PU = Pre-ulcerative lesion F = Fissure, S = Swelling, D = Dryness Assessment Right: Left: Other Deformity: No No Prior Foot Ulcer: No No Prior Amputation: No No Charcot Joint: No No Ambulatory Status: Ambulatory With Help Assistance Device: Cane Gait: Buyer, retail  Signature(s) Signed: 02/05/2020 4:51:51 PM By: Grover Canavan Entered By: Grover Canavan on 02/05/2020 09:34:16 Adam Merritt ([id number]) -------------------------------------------------------------------------------- Nutrition Risk Screening Details Patient Name: Adam Merritt Date of Service: 02/05/2020 9:15 AM Medical Record Number: [id number] Patient Account Number: [id number] Date of Birth/Sex: 10-29-1945 (74 y.o. M) Treating RN: Grover Canavan Primary Care Saretta Dahlem: [name] Other Clinician: Referring Jamy Cleckler: [name] Treating Shabree Tebbetts/Extender: Melburn Hake, [name]: 0 Height (in): 65 Weight (lbs): 289 Body Mass Index (BMI): 48.1 Nutrition Risk Screening Items Score Screening NUTRITION RISK SCREEN: I have an illness or condition that made me change the kind and/or amount of food I eat 0 No I eat fewer than two meals per day 0 No I eat few fruits and vegetables, or milk products 0 No I have three or more drinks of beer, liquor or wine almost every day 0 No I have tooth or mouth problems that make it hard for me to eat 0 No I don't always have enough money to buy the food I need 0 No I eat alone most of the time 0 No I take three or more different prescribed or over-the-counter drugs a day 0 No Without wanting to, I have lost or gained 10 pounds in the last six months 0 No I am not always physically able to shop, cook and/or feed myself 0 No Nutrition Protocols Good Risk Protocol Moderate Risk Protocol 0 Provide education on nutrition High Risk Proctocol Risk Level: Good Risk Score: 0 Electronic Signature(s) Signed: 02/05/2020 4:51:51 PM By: Grover Canavan Entered By: Grover Canavan on 02/05/2020 09:31:44

## 2020-02-19 ENCOUNTER — Encounter: Payer: Medicare PPO | Admitting: Physician Assistant

## 2020-02-19 ENCOUNTER — Other Ambulatory Visit: Payer: Self-pay

## 2020-02-19 DIAGNOSIS — I509 Heart failure, unspecified: Secondary | ICD-10-CM | POA: Diagnosis not present

## 2020-02-19 DIAGNOSIS — E1151 Type 2 diabetes mellitus with diabetic peripheral angiopathy without gangrene: Secondary | ICD-10-CM | POA: Diagnosis not present

## 2020-02-19 DIAGNOSIS — I872 Venous insufficiency (chronic) (peripheral): Secondary | ICD-10-CM | POA: Diagnosis not present

## 2020-02-19 DIAGNOSIS — L97822 Non-pressure chronic ulcer of other part of left lower leg with fat layer exposed: Secondary | ICD-10-CM | POA: Diagnosis not present

## 2020-02-19 DIAGNOSIS — I2510 Atherosclerotic heart disease of native coronary artery without angina pectoris: Secondary | ICD-10-CM | POA: Diagnosis not present

## 2020-02-19 DIAGNOSIS — I110 Hypertensive heart disease with heart failure: Secondary | ICD-10-CM | POA: Diagnosis not present

## 2020-02-19 DIAGNOSIS — E669 Obesity, unspecified: Secondary | ICD-10-CM | POA: Diagnosis not present

## 2020-02-19 DIAGNOSIS — G4730 Sleep apnea, unspecified: Secondary | ICD-10-CM | POA: Diagnosis not present

## 2020-02-19 DIAGNOSIS — L97812 Non-pressure chronic ulcer of other part of right lower leg with fat layer exposed: Secondary | ICD-10-CM | POA: Diagnosis not present

## 2020-02-19 DIAGNOSIS — E11622 Type 2 diabetes mellitus with other skin ulcer: Secondary | ICD-10-CM | POA: Diagnosis not present

## 2020-02-19 DIAGNOSIS — I890 Lymphedema, not elsewhere classified: Secondary | ICD-10-CM | POA: Diagnosis not present

## 2020-02-19 DIAGNOSIS — L97222 Non-pressure chronic ulcer of left calf with fat layer exposed: Secondary | ICD-10-CM | POA: Diagnosis not present

## 2020-02-19 NOTE — Progress Notes (Addendum)
Adam Merritt, Adam Merritt ([id number]) Visit Report for 02/19/2020 Chief Complaint Document Details Patient Name: Adam Merritt, Adam Merritt Date of Service: 02/19/2020 10:45 AM Medical Record Number: [id number] Patient Account Number: [id number] Date of Birth/Sex: [birth year] (74 y.o. M) Treating RN: Cornell Barman Primary Care Provider: [name] Other Clinician: Referring Provider: [name] Treating Provider/Extender: Melburn Hake, Shatonia Hoots [name]: 2 Information Obtained from: Patient Chief Complaint Left leg ulcers Electronic Signature(s) Signed: 02/19/2020 11:16:45 AM By: Worthy Keeler PA-C Entered By: Worthy Keeler on 02/19/2020 11:16:44 Adam Merritt ([id number]) -------------------------------------------------------------------------------- HPI Details Patient Name: Adam Merritt Date of Service: 02/19/2020 10:45 AM Medical Record Number: [id number] Patient Account Number: [id number] Date of Birth/Sex: Aug 28, 1945 (74 y.o. M) Treating RN: Cornell Barman Primary Care Provider: [name] Other Clinician: Referring Provider: [name] Treating Provider/Extender: Melburn Hake, Shawana Knoch [name]: 2 History of Present Illness HPI Description: 10/18/17-He is here for initial evaluation of bilateral lower extremity ulcerations in the presence of venous insufficiency and lymphedema. He has been seen by vascular medicine in the past, Dr. Lucky Cowboy, last seen in 2016. He does have a history of abnormal ABIs, which is to be expected given his lymphedema and venous insufficiency. According to Epic, it appears that all attempts for arterial evaluation and/or angiography were not follow through with by patient. He does have a history of being seen in lymphedema clinic in 2018, stopped going approximately 6 months ago stating "it didn't do any good". He does not have lymphedema pumps, he does not have custom fit compression wrap/stockings. He is diabetic and his recent A1c last month was 7.6. He admits to  chronic bilateral lower extremity pain, no change in pain since blister and ulceration development. He is currently being treated with Levaquin for bronchitis. He has home health and we will continue. 10/25/17-He is here in follow-up evaluation for bilateral lower extremity ulcerationssubtle he remains on Levaquin for bronchitis. Right lower extremity with no evidence of drainage or ulceration, persistent left lower extremity ulceration. He states that home health has not been out since his appointment. He went to [facility] on Tuesday, studies revealed: RIGHT ABI 0.9, TBI 0.6 LEFT ABI 1.1, TBI 0.6 with triphasic flow bilaterally. We will continue his same treatment plan. He has been educated on compression therapy and need for elevation. He will benefit from lymphedema pumps 11/01/17-He is here in follow-up evaluation for left lower extremity ulcer. The right lower extremity remains healed. He has home health services, but they have not been out to see the patient for 2-3 weeks. He states it home health physical therapy changed his dressing yesterday after therapy; he placed Ace wrap compression. We are still waiting for lymphedema pumps, reordered d/t need for company change. 11/08/17-He is here in follow-up evaluation for left lower extremity ulcer. It is improved. Edema is significantly improved with compression therapy. We will continue with same treatment plan and he will follow-up next week. No word regarding lymphedema pumps 11/15/17-He is here in follow-up evaluation for left lower extremity ulcer. He is healed and will be discharged from wound care services. I have reached out to [facility] regarding his lymphedema pumps. They have been unable to reach the patient; the contact number they had with the patient's wife's cell phone and she has not answered any unrecognized calls. Contact should be made today, trial planned for next week; [facility] will continue to  follow 11/27/17 on evaluation today patient has multiple blistered areas over the right lower  extremity his left lower extremity appears to be doing okay. These blistered areas show signs of no infection which is great news. With that being said he did have some necrotic skin overlying which was mechanically debrided away with saline and gauze today without complication. Overall post debridement the wounds appear to be doing better but in general his swelling seems to be increased. This is obviously not good news. I think this is what has given rise to the blisters. 12/04/17 on evaluation today patient presents for follow-up concerning his bilateral lower extremity edema in the right lower extremity ulcers. He has been tolerating the dressing changes without complication. With that being said he has had no real issues with the wraps which is also good news. Overall I'm pleased with the progress he's been making. 12/11/17 on evaluation today patient appears to be doing rather well in regard to his right lateral lower extremity ulcer. He's been tolerating the dressing changes without complication. Fortunately there does not appear to be any evidence of infection at this time. Overall I'm pleased with the progress that is being made. Unfortunately he has been in the hospital due to having what sounds to be a stomach virus/flu fortunately that is starting to get better. 12/18/17 on evaluation today patient actually appears to be doing very well in regard to his bilateral lower extremities the swelling is under fairly good control his lymphedema pumps are still not up and running quite yet. With that being said he does have several areas of opening noted as far as wounds are concerned mainly over the left lower extremity. With that being said I do believe once he gets lymphedema pumps this would least help you mention some the fluid and preventing this from occurring. Hopefully that will be set up soon sleeves  are Artie in place at his home he just waiting for the machine. 12/25/17 on evaluation today patient actually appears to be doing excellent in fact all of his ulcers appear to have resolved his legs appear very well. I do think he needs compression stockings we have discussed this and they are actually going to go to [place] today to elastic therapy to get this fitted for him. I think that is definitely a good thing to do. Readmission: 04/09/18 upon evaluation today this patient is seen for readmission due to bilateral lower extremity lymphedema. He has significant swelling of his extremities especially on the left although the right is also swollen he has weeping from both sides. There are no obvious open wounds at this point. Fortunately he has been doing fairly well for quite a bit of time since I last saw him. Nonetheless unfortunately this seems to have reopened and is giving quite a bit of trouble. He states this began about a week ago when he first called [place] to get in to be seen. No fevers, chills, nausea, or vomiting noted at this time. He has not been using his lymphedema pumps due to the fact that they won't fit on his leg at this point likewise is also not been using his compression for essentially the same reason. 04/16/18 upon evaluation today patient actually appears to be doing a little better in regard to the fluid in his bilateral lower extremities. With that being said he's had three falls since I saw him last week. He also states that he's been feeling very poorly. I was concerned last week and feel like that the concern is still there as far as the congestion in  his chest is concerned he seems to be breathing about the same as last week but again he states he's very weak he's not even able to walk further than from the chair to the door. His wife had to buy a wheelchair just to be able to get them out of the house to get to the appointment today. This has me very  concerned. 04/23/18 on evaluation today patient actually appears to be doing much better than last week's evaluation. At that time actually had to transport him to the ER via EMS and he subsequently was admitted for acute pulmonary edema, acute renal failure, and acute congestive heart failure. Fortunately he is doing much better. Apparently they did dialyze him and were able to take off roughly 35 pounds of fluid. Nonetheless he is feeling much better both in regard to his breathing and he's able to get around much better at this time compared to previous. Overall I'm very Adam Merritt, Adam Merritt ([id number]) happy with how things are at this time. There does not appear to be any evidence of infection currently. No fevers, chills, nausea, or vomiting noted at this time. 04/30/2018 patient seen today for follow-up and management of bilateral lower extremity lymphedema. He did express being more sad today than usual due to the recent loss of his dog. He states that he has been compliant with using the lymphedema pumps. However he does admit a minute over the last 2-3 days he has not been using the pumps due to the recent loss of his dog. At this time there is no drainage or open wounds to his lower extremities. The left leg edema is measuring smaller today. Still has a significant amount of edema on bilateral lower extremities With dry flaky skin. He will be referred to the lymphedema clinic for further management. Will continue 3 layer compression wraps and follow-up in 1-2 weeks.Denies any pain, fever, chairs recently. No recent falls or injuries reported during this visit. 05/07/18 on evaluation today patient actually appears to be doing very well in regard to his lower extremities in general all things considered. With that being said he is having some pain in the legs just due to the amount of swelling. He does have an area where he had a blister on the left lateral lower extremity this is open at this  point other than that there's nothing else weeping at this time. 05/14/18 on evaluation today patient actually appears to be doing excellent all things considered in regard to his lower extremities. He still has a couple areas of weeping on each leg which has continued to be the issue for him. He does have an appointment with the lymphedema clinic although this isn't until February 2020. That was the earliest they had. In the meantime he has continued to tolerate the compression wraps without complication. 05/28/18 on evaluation today patient actually appears to be doing more poorly in regard to his left lower extremity where he has a wound open at this point. He also had a fall where he subsequently injured his right great toe which has led to an open wound at the site unfortunately. He has been tolerating the dressing changes without complication in general as far as the wraps are concerned that he has not been putting any dressing on the left 1st toe ulcer site. 06/11/18 on evaluation today patient appears to be doing much worse in regard to his bilateral lower extremity ulcers. He has been tolerating the dressing changes without complication although  his legs have not been wrapped more recently. Overall I am not very pleased with the way his legs appear. I do believe he needs to be back in compression wraps he still has not received his compression wraps from the [facility] hospital as of yet. 06/18/18 on evaluation today patient actually appears to be doing significantly better than last time I saw him. He has been tolerating the compression wraps without complication in the circumferential ulcers especially appear to be doing much better. His toe ulcer on the right in regard to the great toe is better although not as good as the legs in my pinion. No fevers chills noted 07/02/18 on evaluation today patient appears to be doing much better in regard to his lower extremity ulcers. Unfortunately since I last  saw him he's had the distal portion of his right great toe if he dated it sounds as if this actually went downhill very quickly. I had only seen him a few days prior and the toe did not appear to be infected at that point subsequently became infected very rapidly and it was decided by the surgeon that the distal portion of the toe needed to be removed. The patient seems to be doing well in this regard he tells me. With that being said his lower extremities are doing better from the standpoint of the wounds although he is significantly swollen at this point. 07/09/18 on evaluation today patient appears to be doing better in regard to the wounds on his lower extremities. In fact everything is almost completely healed he is just a small area on the left posterior lower extremity that is open at this point. 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 With that being said he needs some kind of compression it does sound like he may not be wearing his compression, that is the wraps, during the entire time between when he's here visit to visit. Apparently his wife took the current one off because it began to "fall apart". 07/16/18 on evaluation today patient appears to be extremely swollen especially in regard to his left lower extremity unfortunately. He also has a new skin tear over the left lower extremity and there's a smaller area on the right lower extremity as well. Unfortunately this seems to be due in part to blistering and fluid buildup in his leg. He did get the reduction wraps that were ordered by the [facility] hospital for him to go to lymphedema clinic. With that being said his wounds on the legs have not healed to the point to where they would likely accept them as a patient lymphedema clinic currently. We need to try to  get this to heal. With that being said he's been taking his wraps off which is not doing him any favors at this point. In fact this is probably quite counterproductive compared to what needs to occur. We will likely need to increase to a four layer compression wrap and continue to also utilize elevation and he has to keep the wraps on not take them off as he's been doing currently hasn't had a wrap on since Saturday. 07/23/18 on evaluation today patient appears to be doing much better in regard to his bilateral lower extremities. In fact his left lower extremity which was the largest is actually 15 cm smaller today compared to  what it was last time he was here in our clinic. This is obviously good news after just one week. Nonetheless the differences he actually kept the wraps on during the entire week this time. That's not typical for him. I do believe he understands a little bit better now the severity of the situation and why it's important for him to keep these wraps on. 07/30/18 on evaluation today patient actually appears to be doing rather well in regard to his lower extremities. His legs are much smaller than they have been in the past and he actually has only one very small rudder superficial region remaining that is not closed on the left lateral/posterior lower extremity even this is almost completely close. I do believe likely next week he will be healed without any complications. I do think we need to continue the wraps however this seems to be beneficial for him. I also think it may be a good time for [place] to go ahead and see about getting the appointment with the lymphedema clinic which is supposed to be made for him in order to keep this moving along and hopefully get them into compression wraps that will in the end help him to remain healed. 08/06/18 on evaluation today patient actually appears to be doing very well in regard as bilateral lower extremities. In fact his wounds appear to  be completely healed at this time. 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 He is in the process of getting appointment with the lymphedema clinic we have made this referral were just waiting to hear back on the schedule time. We need to follow up on that today as well. 08/13/18 on evaluation today patient actually appears to be doing very well in regard to his bilateral lower extremities there are no open wounds at this point. We are gonna go ahead and see about ordering the Velcro compression wraps for him had a discussion with them about [place] doing it versus the [place] they feel like they can definitely afford going ahead and get the wraps themselves and they would prefer to try to avoid having to go to the lymphedema clinic if it all possible which I completely understand. As long as he has good compression I'm okay either way. 3/17//20 on evaluation today patient actually appears to be doing well in regard to his bilateral lower extremity ulcers. He has been tolerating the dressing changes without complication specifically the compression wraps. Overall is had no issues my fingers finding that I see at this point is that he is having trouble with constipation. He tells me he has not been able to go to the bathroom for about six days. He's taken to over-the- counter oral laxatives unfortunately this is not helping. He has not contacted his doctor. Adam Merritt, Adam Merritt ([id number]) 08/27/18 on evaluation today patient appears to be doing fairly well in regard to his lower extremities at this point. There does not appear to be any new altars is swelling is very well controlled. We are still waiting for his Velcro compression wraps to arrive that should be sometime in the next week is Artie sent the check for these. 09/03/18 on evaluation today patient appears to be doing excellent in regard to his bilateral lower extremities which he shows no signs of  wound openings in regard to this point. He does have his Velcro compression wraps which did arrive in the mail since I saw him last week. Overall  he is doing excellent in my pinion. 09/13/18 on evaluation today patient appears to be doing very well currently in regard to the overall appearance of his bilateral lower extremities although he's a little bit more swollen than last time we saw him. At that point he been discharged without any open wounds. Nonetheless he has a small open wound on the posterior left lower extremity with some evidence of cellulitis noted as well. Fortunately I feel like he has made good progress overall with regard to his lower extremities from were things used to be. 09/20/18 on evaluation today patient actually appears to be doing much better. The erythematous lower extremity is improving wound itself which is still open appears to be doing much better as far as but appearance as well as pain is concerned overall very pleased in this regard. There's no signs of active infection at this time. 09/27/18 on evaluation today patient's wounds on the lower extremity actually appear to be doing fairly well at this time which is good news. There is no evidence of active infection currently and again is just as left lower extremity were there any wounds at this point anyway. I believe they may be completely healed but again I'm not 100% sure based on evaluation today. I think one more week of observation would likely be a good idea. 10/04/18 on evaluation today patient actually appears to be doing excellent in regard to the left lower extremity which actually appears to be completely healed as of today. Unfortunately he's been having issues with his right lower extremity have a new wound that has opened. Fortunately there's no evidence of active infection at this time which is good news. 10/10/18 upon evaluation today patient actually appears to be doing a little bit worse with the new  open area on his right posterior lower extremity. He's been tolerating the dressing changes without complication. Right now we been using his compression wraps although I think we may need to switch back to actually performing bilateral compression wraps are in the clinic. No fevers, chills, nausea, or vomiting noted at this time. 10/17/18 on evaluation today patient actually appears to be doing quite well in regard to his bilateral lower extremity ulcers. He has been tolerating the reps without complication although he would prefer not to rewrap his legs as of today. Fortunately there's no signs of active infection which is good news. No fevers, chills, nausea, or vomiting noted at this time. 10/24/18 on evaluation today patient appears to be doing very well in regard to his lower extremities. His right lower extremity is shown signs of healing and his left lower Trinity though not healed appears to be improving which is excellent news. Overall very pleased with how things seem to be progressing at this time. The patient is likewise happy to hear this. His Velcro compression wraps however have not been put on properly will gonna show his wife how to do that properly today. 10/31/18 on evaluation today patient appears to be doing more poorly in regard to his left lower extremity in particular although both lower extremities actually are showing some signs of being worse than my previous evaluation. Unfortunately I'm just not sure that his compression stockings even with the use of the compression/lymphedema pumps seem to be controlling this well. Upon further questioning he tells me that he also is not able to lie flat in the bed due to his congestive heart failure and difficulty breathing. For that reason he sleeps in his recliner.  To make matters worse his recliner also cannot even hold his legs up so instead of even being somewhat elevated they pretty much hang down to the floor. This is the way he  sleeps each night which is definitely counterproductive to everything else that were attempting to do from the standpoint of controlling his fluid. Nonetheless I think that he potentially could benefit from a hospital bed although this would be something that his primary care provider would likely have to order since anything that is order on our side has to be directly related to wound care and again the hospital bed is not necessarily a direct relation to although I think it does contribute to his overall wound status on his lower extremities. 11/07/18 on evaluation today patient appears to be doing better in regard to his bilateral lower extremity. He's been tolerating the dressing changes without complication. We did in the interim since I last saw him switch to just using extras orbiting new alginate and that seems to have done much better for him. I'm very pleased with the overall progress that is made. 11/14/18 on evaluation today patient appears to be redoing rather well in regard to his left lower extremity ulcers which are the only ones remaining at this point. Fortunately there's no signs of active infection at this time which is good news. Overall been very pleased with how things seem to be progressing currently. No fevers, chills, nausea, or vomiting noted at this time. 11/20/17 on evaluation today patient actually appears to be doing quite well in regard to his lower extremities on the left on the right he has several blisters that showed up although there's some question about whether or not he has had his broker compression wraps on like he was supposed to or not. Fortunately there's no signs of active infection at this time which is good news. Unfortunately though he is doing well from the standpoint of the left leg the right leg is not doing as well again this is when we did not wrap last week. 11/28/18 on evaluation today patient appears to be doing well in regard to his bilateral lower  extremities is left appears to be healed is right is not healed but is very close to being so. Overall very pleased with how things seem to be progressing. Patient is likewise happy that things are doing well. 12/09/18 on evaluation today patient actually appears to be completely healed which is excellent news. He actually seems to be doing well in regard to the swelling of the bilateral lower extremities which is also great news. Overall very pleased with how things seem to be progressing. Readmission: 03/18/2019 upon evaluation today patient presents for reevaluation concerning issues that he is having with his left lower extremity where he does have a wound noted upon inspection today. He also has a wound on the right second toe on the tip where it is apparently been rubbing in his shoe I did look at issues and you can actually see where this has been occurring as well. Fortunately there is no signs of infection with regard to the toe necessarily although it is very swollen compared to normal that does have me somewhat concerned about the possibility of further evaluation for infection/osteomyelitis. I would recommend an x-ray to start with. Otherwise he states that it is been 1-2 weeks that he has had the draining in regard to left lower extremity he does not know how this happened he has been wearing his compression  appropriately and I do see that as well today but nonetheless I do think that he needs to continue to be very cautious with regard to elevation as well. Adam Merritt, Adam Merritt ([id number]) 03/25/2019 on evaluation today patient appears to be doing much better in regard to his lower extremity at this time. That is on the left. He did culture positive for Pseudomonas but the good news is he seems to be doing much better the gentamicin we applied topically seems to have done a great job for him. There is no signs of active infection at this time systemically and locally he is doing much better.  No fevers, chills, nausea, vomiting, or diarrhea. In regard to his right toe this seems to be doing much better there is very little pressure noted at this point I did clean up some of the callus today around the edges of the wound as well as the surface of the wound but to be honest he is progressing quite nicely. 10/30; the area on the left anterior lower tibia area is healed over. His edema control is adequate even though his use of the compression pumps seems very intermittent. He has a juxta lite stocking on the right leg and a think there is 1 for the left leg at home. His wife is putting these on. He has an area on the plantar right second toe which is a hammertoe they are trying to offload this 04/08/2019 on evaluation today patient appears to be doing well in regard to his lower extremities which is good news. We are seeing him today for his toe ulcer which is giving him a little bit more trouble but again still seems to be doing better at this point which is good news. He is going require some sharp debridement in regard to the toe today however. 04/15/2019 on evaluation today patient actually appears to be doing quite well with regard to his toe ulcer. I am very pleased with how things are doing in that regard. With regard to his lower extremity edema in general he tells me he is not been using the lymphedema pumps regularly although he does not use them. I think that he needs to use them more regularly he does have a small blister on the left lower extremity this is not open at this point but obviously it something that can get worse if he does not keep the compression going and the pumps going as well. 04/22/2019 on evaluation today patient appears to be doing well with regard to his toe ulcer. He has been tolerating the dressing changes without complication. This is measuring slightly smaller this week as compared to last week. Fortunately there is no signs of active infection at this  time. 05/06/2019 on evaluation today patient actually appears to be doing excellent. In fact his toe appears to be completely healed which is great news. There is no signs of active infection at this time. Readmission: Patient presents today for follow-up after having been discharged at the beginning of December 2020. He states that in recent weeks things have reopened and has been having more trouble at this point. With that being said fortunately there is no signs of active infection at this time. No fever chills noted. Nonetheless he also does have an issue with one of his toes as well that seems to be somewhat this is on the right foot second toe. 07/25/2019 upon evaluation today patient's lower extremities appear to be doing excellent bilaterally. I really feel  like he is showing signs of great improvement and in fact I think he is close to healing which is great news. There is no signs of active infection at this time. No fevers, chills, nausea, vomiting, or diarrhea. 07/31/2019 upon evaluation today patient appears to be doing excellent and in fact I think may be completely healed in regard to his right lower extremity that were still to monitor this 1 more week before closing it out. The left lower extremity is measuring smaller although not completely closed seems to be doing excellent. 08/07/2019 upon evaluation today patient appears to be doing excellent in regard to his wounds. In fact the right lower extremity is completely healed there is no issues here. The left lower extremity has 1 very small area still remaining fortunately there is no signs of infection and overall I think he is very close to closure of the here as well. 08/14/19 upon evaluation today patient appears to be doing excellent in regard to his lower extremities. In fact he appears to be completely healed as of today. Fortunately there's no signs of active infection at this time. No fevers, chills, nausea, or vomiting noted  at this time. READMISSION 09/26/2019 This is a 74 year old man who is well-known to this clinic having just been discharged on 08/14/2019. He has known lymphedema and chronic venous insufficiency. He has Farrow wrap stockings and also external compression pumps. He does not use the external compression pumps however. He does however apparently fairly faithfully use the stockings. They have not been lotion in his legs. He has developed new wounds on the right anterior as well as the left medial left lateral and left posterior calf. He returns for our review of this Past medical history includes coronary artery disease, hypertension, congestive heart failure, COPD, venous insufficiency with lymphedema, type 2 diabetes. He apparently has compression pumps, Farrow wraps and at 1 point a hospital bed although I believe he sleeps in a recliner 10/03/2019 upon evaluation today patient appears to be doing better with regard to his wounds in general. He has been tolerating the dressing changes without complication. Fortunately there is no signs of active infection. No fevers, chills, nausea, vomiting, or diarrhea. I do believe the compression wraps are helping as they always have in the past. 10/10/2019 upon evaluation today patient appears to be doing excellent at this point. His right lower extremity is measuring much better and in fact is completely healed. His left lower extremity is also measuring better though not healed seems to be doing excellent at this point which is great news. Overall I am very pleased with how things appear currently. 10/27/2019 upon evaluation today patient appears to be doing quite well with regard to his wounds currently. He has been tolerating the dressing changes without complication. Fortunately there is no signs of active infection at this time. In fact he is almost completely healed on the left and has just a very small area open and on the right he is completely  healed. 11/04/2019 upon evaluation today patient appears to be doing quite well with regard to his wounds. In fact he appears to be quite possibly completely healed on the left lower extremity now as well the right lower extremity is still doing well. With that being said unfortunately though this may be healed I think it is a very fragile area and I cannot even confirm there is not a very small opening still remaining. I really think he may benefit from 1 additional weeks  wrapping before discontinuing. 11/10/2019 upon evaluation today patient appears to be doing well in regard to his original wounds in fact everything that we were treating last week is completely healed on the left. Unfortunately he has a new skin tear just above where the wrap slipped down due to a fall he sustained causing this injury. 11/17/2019 on evaluation today patient appears to be doing better with regard to his wound. He has been tolerating the dressing changes without Adam Merritt, Adam Merritt. ([id number]) complication. Fortunately there is no signs of active infection at this time. No fevers, chills, nausea, vomiting, or diarrhea. 11/24/2019 upon evaluation today patient appears to be doing well with regard to his wound. This is making good progress there does not appear to be signs of active infection but overall I do feel like he is headed in the correct direction. Overall he is tolerating the compression wrap as well. 12/02/2019 upon evaluation today patient appears to be doing well with regard to his leg ulcer. In fact this appears to be completely healed and this is the last of the wounds that he had but still the left anterior lower extremity. Fortunately there is no signs of active infection at this time. No fevers, chills, nausea, vomiting, or diarrhea. Readmission: 02/05/2020 on evaluation today patient appears to be doing well with regard to his wound all things considered. He actually tells me that a couple weeks ago he had  trouble with his wraps and therefore took him off for a couple of days in order to give his legs a break. It was during this time that he actually developed increased swelling and edema of his legs and blisters on both legs. That is when he called to make the appointment. Nonetheless since that time he began wearing his compression wraps which are Velcro in the meantime and has done extremely well with this. In fact his leg appears to be under good control as far as edema is concerned today it is nothing like what I am seeing when he has come in for evaluations in the past. They have been using some of the silver alginate at home which has been beneficial for him. Fortunately there is no signs of active infection at this time. No fevers, chills, nausea, vomiting, or diarrhea. 02/19/2020 on evaluation today patient unfortunately does not appear to be doing quite as well as I would like to see. He has no signs of active infection at this time but unfortunately he is not doing well in regard to the overall appearance of his left leg. He has a couple other areas that are weeping now and the leg is much more swollen. I think that he needs to actually have a compression wrap to try to manage this. Electronic Signature(s) Signed: 02/19/2020 11:38:03 AM By: Worthy Keeler PA-C Entered By: Worthy Keeler on 02/19/2020 11:38:03 Adam Merritt ([id number]) -------------------------------------------------------------------------------- Physical Exam Details Patient Name: Adam Merritt Date of Service: 02/19/2020 10:45 AM Medical Record Number: [id number] Patient Account Number: [id number] Date of Birth/Sex: 1946/01/26 (74 y.o. M) Treating RN: Cornell Barman Primary Care Provider: [name] Other Clinician: Referring Provider: [name] Treating Provider/Extender: Melburn Hake, Anaiyah Anglemyer [name]: 2 Constitutional Obese and well-hydrated in no acute distress. Respiratory normal breathing without  difficulty. Psychiatric this patient is able to make decisions and demonstrates good insight into disease process. Alert and Oriented x 3. pleasant and cooperative. Notes Upon inspection patient's wounds again do not appear to be terrible but again  his leg is significantly swollen even compared to last time I am not sure what he did differently but his wrap does not seem to be controlling this nearly to the degree that we need to see. Electronic Signature(s) Signed: 02/19/2020 11:38:18 AM By: Worthy Keeler PA-C Entered By: Worthy Keeler on 02/19/2020 11:38:17 Adam Merritt ([id number]) -------------------------------------------------------------------------------- Physician Orders Details Patient Name: Adam Merritt Date of Service: 02/19/2020 10:45 AM Medical Record Number: [id number] Patient Account Number: [id number] Date of Birth/Sex: 07/17/1945 (74 y.o. M) Treating RN: Grover Canavan Primary Care Provider: [name] Other Clinician: Referring Provider: [name] Treating Provider/Extender: Melburn Hake, Mcclain Shall [name]: 2 Verbal / Phone Orders: No Diagnosis Coding ICD-10 Coding Code Description I89.0 Lymphedema, not elsewhere classified I87.2 Venous insufficiency (chronic) (peripheral) L97.822 Non-pressure chronic ulcer of other part of left lower leg with fat layer exposed Wound Cleansing o Cleanse wound with mild soap and water o Dial antibacterial soap, wash wounds, rinse and pat dry prior to dressing wounds Primary Wound Dressing Wound #36 Left,Anterior Lower Leg o Silver Alginate Secondary Dressing Wound #36 Left,Anterior Lower Leg o XtraSorb Dressing Change Frequency Wound #36 Left,Anterior Lower Leg o Change dressing every week Follow-up Appointments Wound #36 Left,Anterior Lower Leg o Return Appointment in 1 week. Edema Control Wound #36 Left,Anterior Lower Leg o 4-Layer Compression System - Left Lower Extremity. o Patient to  wear own Velcro compression garment. - on right leg o Elevate legs to the level of the heart and pump ankles as often as possible Electronic Signature(s) Signed: 02/19/2020 4:34:46 PM By: Grover Canavan Signed: 02/19/2020 5:15:44 PM By: Worthy Keeler PA-C Entered By: Grover Canavan on 02/19/2020 11:23:17 Adam Merritt ([id number]) -------------------------------------------------------------------------------- Problem List Details Patient Name: Adam Merritt Date of Service: 02/19/2020 10:45 AM Medical Record Number: [id number] Patient Account Number: [id number] Date of Birth/Sex: June 22, 1945 (74 y.o. M) Treating RN: Cornell Barman Primary Care Provider: [name] Other Clinician: Referring Provider: [name] Treating Provider/Extender: Melburn Hake, Vernesha Talbot [name]: 2 Active Problems ICD-10 Encounter Code Description Active Date MDM Diagnosis I89.0 Lymphedema, not elsewhere classified 02/05/2020 No Yes I87.2 Venous insufficiency (chronic) (peripheral) 02/05/2020 No Yes L97.822 Non-pressure chronic ulcer of other part of left lower leg with fat layer 02/05/2020 No Yes exposed Inactive Problems Resolved Problems Electronic Signature(s) Signed: 02/19/2020 11:16:38 AM By: Worthy Keeler PA-C Entered By: Worthy Keeler on 02/19/2020 11:16:38 Adam Merritt ([id number]) -------------------------------------------------------------------------------- Progress Note Details Patient Name: Adam Merritt Date of Service: 02/19/2020 10:45 AM Medical Record Number: [id number] Patient Account Number: [id number] Date of Birth/Sex: Feb 08, 1946 (74 y.o. M) Treating RN: Cornell Barman Primary Care Provider: [name] Other Clinician: Referring Provider: [name] Treating Provider/Extender: Melburn Hake, Anibal Quinby [name]: 2 Subjective Chief Complaint Information obtained from Patient Left leg ulcers History of Present Illness (HPI) 10/18/17-He is here for initial evaluation of  bilateral lower extremity ulcerations in the presence of venous insufficiency and lymphedema. He has been seen by vascular medicine in the past, Dr. Lucky Cowboy, last seen in 2016. He does have a history of abnormal ABIs, which is to be expected given his lymphedema and venous insufficiency. According to Epic, it appears that all attempts for arterial evaluation and/or angiography were not follow through with by patient. He does have a history of being seen in lymphedema clinic in 2018, stopped going approximately 6 months ago stating "it didn't do any good". He does not have lymphedema pumps, he does not have custom fit compression  wrap/stockings. He is diabetic and his recent A1c last month was 7.6. He admits to chronic bilateral lower extremity pain, no change in pain since blister and ulceration development. He is currently being treated with Levaquin for bronchitis. He has home health and we will continue. 10/25/17-He is here in follow-up evaluation for bilateral lower extremity ulcerationssubtle he remains on Levaquin for bronchitis. Right lower extremity with no evidence of drainage or ulceration, persistent left lower extremity ulceration. He states that home health has not been out since his appointment. He went to [facility] on Tuesday, studies revealed: RIGHT ABI 0.9, TBI 0.6 LEFT ABI 1.1, TBI 0.6 with triphasic flow bilaterally. We will continue his same treatment plan. He has been educated on compression therapy and need for elevation. He will benefit from lymphedema pumps 11/01/17-He is here in follow-up evaluation for left lower extremity ulcer. The right lower extremity remains healed. He has home health services, but they have not been out to see the patient for 2-3 weeks. He states it home health physical therapy changed his dressing yesterday after therapy; he placed Ace wrap compression. We are still waiting for lymphedema pumps, reordered d/t need for company  change. 11/08/17-He is here in follow-up evaluation for left lower extremity ulcer. It is improved. Edema is significantly improved with compression therapy. We will continue with same treatment plan and he will follow-up next week. No word regarding lymphedema pumps 11/15/17-He is here in follow-up evaluation for left lower extremity ulcer. He is healed and will be discharged from wound care services. I have reached out to [facility] regarding his lymphedema pumps. They have been unable to reach the patient; the contact number they had with the patient's wife's cell phone and she has not answered any unrecognized calls. Contact should be made today, trial planned for next week; [facility] will continue to follow 11/27/17 on evaluation today patient has multiple blistered areas over the right lower extremity his left lower extremity appears to be doing okay. These blistered areas show signs of no infection which is great news. With that being said he did have some necrotic skin overlying which was mechanically debrided away with saline and gauze today without complication. Overall post debridement the wounds appear to be doing better but in general his swelling seems to be increased. This is obviously not good news. I think this is what has given rise to the blisters. 12/04/17 on evaluation today patient presents for follow-up concerning his bilateral lower extremity edema in the right lower extremity ulcers. He has been tolerating the dressing changes without complication. With that being said he has had no real issues with the wraps which is also good news. Overall I'm pleased with the progress he's been making. 12/11/17 on evaluation today patient appears to be doing rather well in regard to his right lateral lower extremity ulcer. He's been tolerating the dressing changes without complication. Fortunately there does not appear to be any evidence of infection at this time. Overall I'm pleased  with the progress that is being made. Unfortunately he has been in the hospital due to having what sounds to be a stomach virus/flu fortunately that is starting to get better. 12/18/17 on evaluation today patient actually appears to be doing very well in regard to his bilateral lower extremities the swelling is under fairly good control his lymphedema pumps are still not up and running quite yet. With that being said he does have several areas of opening noted as far as  wounds are concerned mainly over the left lower extremity. With that being said I do believe once he gets lymphedema pumps this would least help you mention some the fluid and preventing this from occurring. Hopefully that will be set up soon sleeves are Artie in place at his home he just waiting for the machine. 12/25/17 on evaluation today patient actually appears to be doing excellent in fact all of his ulcers appear to have resolved his legs appear very well. I do think he needs compression stockings we have discussed this and they are actually going to go to [place] today to elastic therapy to get this fitted for him. I think that is definitely a good thing to do. Readmission: 04/09/18 upon evaluation today this patient is seen for readmission due to bilateral lower extremity lymphedema. He has significant swelling of his extremities especially on the left although the right is also swollen he has weeping from both sides. There are no obvious open wounds at this point. Fortunately he has been doing fairly well for quite a bit of time since I last saw him. Nonetheless unfortunately this seems to have reopened and is giving quite a bit of trouble. He states this began about a week ago when he first called [place] to get in to be seen. No fevers, chills, nausea, or vomiting noted at this time. He has not been using his lymphedema pumps due to the fact that they won't fit on his leg at this point likewise is also not been using his  compression for essentially the same reason. 04/16/18 upon evaluation today patient actually appears to be doing a little better in regard to the fluid in his bilateral lower extremities. With that being said he's had three falls since I saw him last week. He also states that he's been feeling very poorly. I was concerned last week and feel like that the concern is still there as far as the congestion in his chest is concerned he seems to be breathing about the same as last week but again he states he's very weak he's not even able to walk further than from the chair to the door. His wife had to buy a wheelchair just to be able to get them out of the house to get to the appointment today. This has me very concerned. Adam Merritt, Adam Merritt ([id number]) 04/23/18 on evaluation today patient actually appears to be doing much better than last week's evaluation. At that time actually had to transport him to the ER via EMS and he subsequently was admitted for acute pulmonary edema, acute renal failure, and acute congestive heart failure. Fortunately he is doing much better. Apparently they did dialyze him and were able to take off roughly 35 pounds of fluid. Nonetheless he is feeling much better both in regard to his breathing and he's able to get around much better at this time compared to previous. Overall I'm very happy with how things are at this time. There does not appear to be any evidence of infection currently. No fevers, chills, nausea, or vomiting noted at this time. 04/30/2018 patient seen today for follow-up and management of bilateral lower extremity lymphedema. He did express being more sad today than usual due to the recent loss of his dog. He states that he has been compliant with using the lymphedema pumps. However he does admit a minute over the last 2-3 days he has not been using the pumps due to the recent loss of his dog. At this  time there is no drainage or open wounds to his lower  extremities. The left leg edema is measuring smaller today. Still has a significant amount of edema on bilateral lower extremities With dry flaky skin. He will be referred to the lymphedema clinic for further management. Will continue 3 layer compression wraps and follow-up in 1-2 weeks.Denies any pain, fever, chairs recently. No recent falls or injuries reported during this visit. 05/07/18 on evaluation today patient actually appears to be doing very well in regard to his lower extremities in general all things considered. With that being said he is having some pain in the legs just due to the amount of swelling. He does have an area where he had a blister on the left lateral lower extremity this is open at this point other than that there's nothing else weeping at this time. 05/14/18 on evaluation today patient actually appears to be doing excellent all things considered in regard to his lower extremities. He still has a couple areas of weeping on each leg which has continued to be the issue for him. He does have an appointment with the lymphedema clinic although this isn't until February 2020. That was the earliest they had. In the meantime he has continued to tolerate the compression wraps without complication. 05/28/18 on evaluation today patient actually appears to be doing more poorly in regard to his left lower extremity where he has a wound open at this point. He also had a fall where he subsequently injured his right great toe which has led to an open wound at the site unfortunately. He has been tolerating the dressing changes without complication in general as far as the wraps are concerned that he has not been putting any dressing on the left 1st toe ulcer site. 06/11/18 on evaluation today patient appears to be doing much worse in regard to his bilateral lower extremity ulcers. He has been tolerating the dressing changes without complication although his legs have not been wrapped more  recently. Overall I am not very pleased with the way his legs appear. I do believe he needs to be back in compression wraps he still has not received his compression wraps from the [facility] hospital as of yet. 06/18/18 on evaluation today patient actually appears to be doing significantly better than last time I saw him. He has been tolerating the compression wraps without complication in the circumferential ulcers especially appear to be doing much better. His toe ulcer on the right in regard to the great toe is better although not as good as the legs in my pinion. No fevers chills noted 07/02/18 on evaluation today patient appears to be doing much better in regard to his lower extremity ulcers. Unfortunately since I last saw him he's had the distal portion of his right great toe if he dated it sounds as if this actually went downhill very quickly. I had only seen him a few days prior and the toe did not appear to be infected at that point subsequently became infected very rapidly and it was decided by the surgeon that the distal portion of the toe needed to be removed. The patient seems to be doing well in this regard he tells me. With that being said his lower extremities are doing better from the standpoint of the wounds although he is significantly swollen at this point. 07/09/18 on evaluation today patient appears to be doing better in regard to the wounds on his lower extremities. In fact everything is almost completely  healed he is just a small area on the left posterior lower extremity that is open at this point. 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 With that being said he needs some kind of compression it does sound like he may not be wearing his compression, that is the wraps, during the entire time between when  he's here visit to visit. Apparently his wife took the current one off because it began to "fall apart". 07/16/18 on evaluation today patient appears to be extremely swollen especially in regard to his left lower extremity unfortunately. He also has a new skin tear over the left lower extremity and there's a smaller area on the right lower extremity as well. Unfortunately this seems to be due in part to blistering and fluid buildup in his leg. He did get the reduction wraps that were ordered by the [facility] hospital for him to go to lymphedema clinic. With that being said his wounds on the legs have not healed to the point to where they would likely accept them as a patient lymphedema clinic currently. We need to try to get this to heal. With that being said he's been taking his wraps off which is not doing him any favors at this point. In fact this is probably quite counterproductive compared to what needs to occur. We will likely need to increase to a four layer compression wrap and continue to also utilize elevation and he has to keep the wraps on not take them off as he's been doing currently hasn't had a wrap on since Saturday. 07/23/18 on evaluation today patient appears to be doing much better in regard to his bilateral lower extremities. In fact his left lower extremity which was the largest is actually 15 cm smaller today compared to what it was last time he was here in our clinic. This is obviously good news after just one week. Nonetheless the differences he actually kept the wraps on during the entire week this time. That's not typical for him. I do believe he understands a little bit better now the severity of the situation and why it's important for him to keep these wraps on. 07/30/18 on evaluation today patient actually appears to be doing rather well in regard to his lower extremities. His legs are much smaller than they have been in the past and he actually has only one very small rudder  superficial region remaining that is not closed on the left lateral/posterior lower extremity even this is almost completely close. I do believe likely next week he will be healed without any complications. I do think we need to continue the wraps however this seems to be beneficial for him. I also think it may be a good time for [place] to go ahead and see about getting the appointment with the lymphedema clinic which is supposed to be made for him in order to keep this moving along and hopefully get them into compression wraps that will in the end help him to remain healed. 08/06/18 on evaluation today patient actually appears to be doing very well in regard as bilateral lower extremities. In fact his wounds appear to be completely healed at this time. 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 He is in the process of getting appointment with the  lymphedema clinic we have made this referral were just waiting to hear back on the schedule time. We need to follow up on that today as well. 08/13/18 on evaluation today patient actually appears to be doing very well in regard to his bilateral lower extremities there are no open wounds at this point. We are gonna go ahead and see about ordering the Velcro compression wraps for him had a discussion with them about [place] doing it versus the [place] they feel like they can definitely afford going ahead and get the wraps themselves and they would prefer to try to avoid having to go to the lymphedema clinic if it all possible which I completely understand. As long as he has good compression I'm okay either way. Adam Merritt, Adam Merritt ([id number]) 3/17//20 on evaluation today patient actually appears to be doing well in regard to his bilateral lower extremity ulcers. He has been tolerating the dressing changes without complication specifically the compression wraps. Overall is had no issues my fingers finding that I see at  this point is that he is having trouble with constipation. He tells me he has not been able to go to the bathroom for about six days. He's taken to over-the- counter oral laxatives unfortunately this is not helping. He has not contacted his doctor. 08/27/18 on evaluation today patient appears to be doing fairly well in regard to his lower extremities at this point. There does not appear to be any new altars is swelling is very well controlled. We are still waiting for his Velcro compression wraps to arrive that should be sometime in the next week is Artie sent the check for these. 09/03/18 on evaluation today patient appears to be doing excellent in regard to his bilateral lower extremities which he shows no signs of wound openings in regard to this point. He does have his Velcro compression wraps which did arrive in the mail since I saw him last week. Overall he is doing excellent in my pinion. 09/13/18 on evaluation today patient appears to be doing very well currently in regard to the overall appearance of his bilateral lower extremities although he's a little bit more swollen than last time we saw him. At that point he been discharged without any open wounds. Nonetheless he has a small open wound on the posterior left lower extremity with some evidence of cellulitis noted as well. Fortunately I feel like he has made good progress overall with regard to his lower extremities from were things used to be. 09/20/18 on evaluation today patient actually appears to be doing much better. The erythematous lower extremity is improving wound itself which is still open appears to be doing much better as far as but appearance as well as pain is concerned overall very pleased in this regard. There's no signs of active infection at this time. 09/27/18 on evaluation today patient's wounds on the lower extremity actually appear to be doing fairly well at this time which is good news. There is no evidence of active  infection currently and again is just as left lower extremity were there any wounds at this point anyway. I believe they may be completely healed but again I'm not 100% sure based on evaluation today. I think one more week of observation would likely be a good idea. 10/04/18 on evaluation today patient actually appears to be doing excellent in regard to the left lower extremity which actually appears to be completely healed as of today. Unfortunately he's been having issues with his  right lower extremity have a new wound that has opened. Fortunately there's no evidence of active infection at this time which is good news. 10/10/18 upon evaluation today patient actually appears to be doing a little bit worse with the new open area on his right posterior lower extremity. He's been tolerating the dressing changes without complication. Right now we been using his compression wraps although I think we may need to switch back to actually performing bilateral compression wraps are in the clinic. No fevers, chills, nausea, or vomiting noted at this time. 10/17/18 on evaluation today patient actually appears to be doing quite well in regard to his bilateral lower extremity ulcers. He has been tolerating the reps without complication although he would prefer not to rewrap his legs as of today. Fortunately there's no signs of active infection which is good news. No fevers, chills, nausea, or vomiting noted at this time. 10/24/18 on evaluation today patient appears to be doing very well in regard to his lower extremities. His right lower extremity is shown signs of healing and his left lower Trinity though not healed appears to be improving which is excellent news. Overall very pleased with how things seem to be progressing at this time. The patient is likewise happy to hear this. His Velcro compression wraps however have not been put on properly will gonna show his wife how to do that properly today. 10/31/18 on  evaluation today patient appears to be doing more poorly in regard to his left lower extremity in particular although both lower extremities actually are showing some signs of being worse than my previous evaluation. Unfortunately I'm just not sure that his compression stockings even with the use of the compression/lymphedema pumps seem to be controlling this well. Upon further questioning he tells me that he also is not able to lie flat in the bed due to his congestive heart failure and difficulty breathing. For that reason he sleeps in his recliner. To make matters worse his recliner also cannot even hold his legs up so instead of even being somewhat elevated they pretty much hang down to the floor. This is the way he sleeps each night which is definitely counterproductive to everything else that were attempting to do from the standpoint of controlling his fluid. Nonetheless I think that he potentially could benefit from a hospital bed although this would be something that his primary care provider would likely have to order since anything that is order on our side has to be directly related to wound care and again the hospital bed is not necessarily a direct relation to although I think it does contribute to his overall wound status on his lower extremities. 11/07/18 on evaluation today patient appears to be doing better in regard to his bilateral lower extremity. He's been tolerating the dressing changes without complication. We did in the interim since I last saw him switch to just using extras orbiting new alginate and that seems to have done much better for him. I'm very pleased with the overall progress that is made. 11/14/18 on evaluation today patient appears to be redoing rather well in regard to his left lower extremity ulcers which are the only ones remaining at this point. Fortunately there's no signs of active infection at this time which is good news. Overall been very pleased with how  things seem to be progressing currently. No fevers, chills, nausea, or vomiting noted at this time. 11/20/17 on evaluation today patient actually appears to be doing quite well  in regard to his lower extremities on the left on the right he has several blisters that showed up although there's some question about whether or not he has had his broker compression wraps on like he was supposed to or not. Fortunately there's no signs of active infection at this time which is good news. Unfortunately though he is doing well from the standpoint of the left leg the right leg is not doing as well again this is when we did not wrap last week. 11/28/18 on evaluation today patient appears to be doing well in regard to his bilateral lower extremities is left appears to be healed is right is not healed but is very close to being so. Overall very pleased with how things seem to be progressing. Patient is likewise happy that things are doing well. 12/09/18 on evaluation today patient actually appears to be completely healed which is excellent news. He actually seems to be doing well in regard to the swelling of the bilateral lower extremities which is also great news. Overall very pleased with how things seem to be progressing. Readmission: 03/18/2019 upon evaluation today patient presents for reevaluation concerning issues that he is having with his left lower extremity where he does have a wound noted upon inspection today. He also has a wound on the right second toe on the tip where it is apparently been rubbing in his shoe I did look at issues and you can actually see where this has been occurring as well. Fortunately there is no signs of infection with regard to Adam Merritt, Adam Merritt. ([id number]) the toe necessarily although it is very swollen compared to normal that does have me somewhat concerned about the possibility of further evaluation for infection/osteomyelitis. I would recommend an x-ray to start with. Otherwise  he states that it is been 1-2 weeks that he has had the draining in regard to left lower extremity he does not know how this happened he has been wearing his compression appropriately and I do see that as well today but nonetheless I do think that he needs to continue to be very cautious with regard to elevation as well. 03/25/2019 on evaluation today patient appears to be doing much better in regard to his lower extremity at this time. That is on the left. He did culture positive for Pseudomonas but the good news is he seems to be doing much better the gentamicin we applied topically seems to have done a great job for him. There is no signs of active infection at this time systemically and locally he is doing much better. No fevers, chills, nausea, vomiting, or diarrhea. In regard to his right toe this seems to be doing much better there is very little pressure noted at this point I did clean up some of the callus today around the edges of the wound as well as the surface of the wound but to be honest he is progressing quite nicely. 10/30; the area on the left anterior lower tibia area is healed over. His edema control is adequate even though his use of the compression pumps seems very intermittent. He has a juxta lite stocking on the right leg and a think there is 1 for the left leg at home. His wife is putting these on. He has an area on the plantar right second toe which is a hammertoe they are trying to offload this 04/08/2019 on evaluation today patient appears to be doing well in regard to his lower extremities which is good  news. We are seeing him today for his toe ulcer which is giving him a little bit more trouble but again still seems to be doing better at this point which is good news. He is going require some sharp debridement in regard to the toe today however. 04/15/2019 on evaluation today patient actually appears to be doing quite well with regard to his toe ulcer. I am very pleased  with how things are doing in that regard. With regard to his lower extremity edema in general he tells me he is not been using the lymphedema pumps regularly although he does not use them. I think that he needs to use them more regularly he does have a small blister on the left lower extremity this is not open at this point but obviously it something that can get worse if he does not keep the compression going and the pumps going as well. 04/22/2019 on evaluation today patient appears to be doing well with regard to his toe ulcer. He has been tolerating the dressing changes without complication. This is measuring slightly smaller this week as compared to last week. Fortunately there is no signs of active infection at this time. 05/06/2019 on evaluation today patient actually appears to be doing excellent. In fact his toe appears to be completely healed which is great news. There is no signs of active infection at this time. Readmission: Patient presents today for follow-up after having been discharged at the beginning of December 2020. He states that in recent weeks things have reopened and has been having more trouble at this point. With that being said fortunately there is no signs of active infection at this time. No fever chills noted. Nonetheless he also does have an issue with one of his toes as well that seems to be somewhat this is on the right foot second toe. 07/25/2019 upon evaluation today patient's lower extremities appear to be doing excellent bilaterally. I really feel like he is showing signs of great improvement and in fact I think he is close to healing which is great news. There is no signs of active infection at this time. No fevers, chills, nausea, vomiting, or diarrhea. 07/31/2019 upon evaluation today patient appears to be doing excellent and in fact I think may be completely healed in regard to his right lower extremity that were still to monitor this 1 more week before closing  it out. The left lower extremity is measuring smaller although not completely closed seems to be doing excellent. 08/07/2019 upon evaluation today patient appears to be doing excellent in regard to his wounds. In fact the right lower extremity is completely healed there is no issues here. The left lower extremity has 1 very small area still remaining fortunately there is no signs of infection and overall I think he is very close to closure of the here as well. 08/14/19 upon evaluation today patient appears to be doing excellent in regard to his lower extremities. In fact he appears to be completely healed as of today. Fortunately there's no signs of active infection at this time. No fevers, chills, nausea, or vomiting noted at this time. READMISSION 09/26/2019 This is a 74 year old man who is well-known to this clinic having just been discharged on 08/14/2019. He has known lymphedema and chronic venous insufficiency. He has Farrow wrap stockings and also external compression pumps. He does not use the external compression pumps however. He does however apparently fairly faithfully use the stockings. They have not been lotion in his  legs. He has developed new wounds on the right anterior as well as the left medial left lateral and left posterior calf. He returns for our review of this Past medical history includes coronary artery disease, hypertension, congestive heart failure, COPD, venous insufficiency with lymphedema, type 2 diabetes. He apparently has compression pumps, Farrow wraps and at 1 point a hospital bed although I believe he sleeps in a recliner 10/03/2019 upon evaluation today patient appears to be doing better with regard to his wounds in general. He has been tolerating the dressing changes without complication. Fortunately there is no signs of active infection. No fevers, chills, nausea, vomiting, or diarrhea. I do believe the compression wraps are helping as they always have in the  past. 10/10/2019 upon evaluation today patient appears to be doing excellent at this point. His right lower extremity is measuring much better and in fact is completely healed. His left lower extremity is also measuring better though not healed seems to be doing excellent at this point which is great news. Overall I am very pleased with how things appear currently. 10/27/2019 upon evaluation today patient appears to be doing quite well with regard to his wounds currently. He has been tolerating the dressing changes without complication. Fortunately there is no signs of active infection at this time. In fact he is almost completely healed on the left and has just a very small area open and on the right he is completely healed. 11/04/2019 upon evaluation today patient appears to be doing quite well with regard to his wounds. In fact he appears to be quite possibly completely healed on the left lower extremity now as well the right lower extremity is still doing well. With that being said unfortunately though this may be healed I think it is a very fragile area and I cannot even confirm there is not a very small opening still remaining. I really think he may benefit from 1 additional weeks wrapping before discontinuing. Adam Merritt, Adam Merritt ([id number]) 11/10/2019 upon evaluation today patient appears to be doing well in regard to his original wounds in fact everything that we were treating last week is completely healed on the left. Unfortunately he has a new skin tear just above where the wrap slipped down due to a fall he sustained causing this injury. 11/17/2019 on evaluation today patient appears to be doing better with regard to his wound. He has been tolerating the dressing changes without complication. Fortunately there is no signs of active infection at this time. No fevers, chills, nausea, vomiting, or diarrhea. 11/24/2019 upon evaluation today patient appears to be doing well with regard to his wound. This  is making good progress there does not appear to be signs of active infection but overall I do feel like he is headed in the correct direction. Overall he is tolerating the compression wrap as well. 12/02/2019 upon evaluation today patient appears to be doing well with regard to his leg ulcer. In fact this appears to be completely healed and this is the last of the wounds that he had but still the left anterior lower extremity. Fortunately there is no signs of active infection at this time. No fevers, chills, nausea, vomiting, or diarrhea. Readmission: 02/05/2020 on evaluation today patient appears to be doing well with regard to his wound all things considered. He actually tells me that a couple weeks ago he had trouble with his wraps and therefore took him off for a couple of days in order to give his legs  a break. It was during this time that he actually developed increased swelling and edema of his legs and blisters on both legs. That is when he called to make the appointment. Nonetheless since that time he began wearing his compression wraps which are Velcro in the meantime and has done extremely well with this. In fact his leg appears to be under good control as far as edema is concerned today it is nothing like what I am seeing when he has come in for evaluations in the past. They have been using some of the silver alginate at home which has been beneficial for him. Fortunately there is no signs of active infection at this time. No fevers, chills, nausea, vomiting, or diarrhea. 02/19/2020 on evaluation today patient unfortunately does not appear to be doing quite as well as I would like to see. He has no signs of active infection at this time but unfortunately he is not doing well in regard to the overall appearance of his left leg. He has a couple other areas that are weeping now and the leg is much more swollen. I think that he needs to actually have a compression wrap to try to manage  this. Objective Constitutional Obese and well-hydrated in no acute distress. Vitals Time Taken: 10:30 AM, Height: 65 in, Weight: 289 lbs, BMI: 48.1, Temperature: 98.3 F, Pulse: 79 bpm, Respiratory Rate: 26 breaths/min, Blood Pressure: 144/81 mmHg. Respiratory normal breathing without difficulty. Psychiatric this patient is able to make decisions and demonstrates good insight into disease process. Alert and Oriented x 3. pleasant and cooperative. General Notes: Upon inspection patient's wounds again do not appear to be terrible but again his leg is significantly swollen even compared to last time I am not sure what he did differently but his wrap does not seem to be controlling this nearly to the degree that we need to see. Integumentary (Hair, Skin) Wound #36 status is Open. Original cause of wound was Gradually Appeared. The wound is located on the Left,Anterior Lower Leg. The wound measures 0.9cm length x 1cm width x 0.1cm depth; 0.707cm^2 area and 0.071cm^3 volume. There is Fat Layer (Subcutaneous Tissue) exposed. There is a medium amount of serous drainage noted. The wound margin is flat and intact. There is no granulation within the wound bed. There is a large (67-100%) amount of necrotic tissue within the wound bed including Adherent Slough. Assessment Active Problems ICD-10 Lymphedema, not elsewhere classified Venous insufficiency (chronic) (peripheral) Non-pressure chronic ulcer of other part of left lower leg with fat layer exposed Adam Merritt, Adam Merritt ([id number]) Procedures Wound #36 Pre-procedure diagnosis of Wound #36 is a Venous Leg Ulcer located on the Left,Anterior Lower Leg . There was a Four Layer Compression Therapy Procedure by Grover Canavan, RN. Post procedure Diagnosis Wound #36: Same as Pre-Procedure Plan Wound Cleansing: Cleanse wound with mild soap and water Dial antibacterial soap, wash wounds, rinse and pat dry prior to dressing wounds Primary Wound  Dressing: Wound #36 Left,Anterior Lower Leg: Silver Alginate Secondary Dressing: Wound #36 Left,Anterior Lower Leg: XtraSorb Dressing Change Frequency: Wound #36 Left,Anterior Lower Leg: Change dressing every week Follow-up Appointments: Wound #36 Left,Anterior Lower Leg: Return Appointment in 1 week. Edema Control: Wound #36 Left,Anterior Lower Leg: 4-Layer Compression System - Left Lower Extremity. Patient to wear own Velcro compression garment. - on right leg Elevate legs to the level of the heart and pump ankles as often as possible 1. I would recommend at this point that we go ahead and continue with  the wound care measures as before specifically with regard to the silver alginate dressing and XtraSorb I think that still can be appropriate. 2. We will initiate a 4-layer compression wrap to left lower extremity along with food anchor at the top and subsequently I do recommend that the patient continue his Velcro wrap on the right. 3. He should try to elevate his legs as much as possible try to keep edema under good control. We will see patient back for reevaluation in 1 week here in the clinic. If anything worsens or changes patient will contact our office for additional recommendations. Electronic Signature(s) Signed: 02/19/2020 11:40:01 AM By: Worthy Keeler PA-C Entered By: Worthy Keeler on 02/19/2020 11:40:01 Adam Merritt ([id number]) -------------------------------------------------------------------------------- SuperBill Details Patient Name: Adam Merritt Date of Service: 02/19/2020 Medical Record Number: [id number] Patient Account Number: [id number] Date of Birth/Sex: Feb 06, 1946 (74 y.o. M) Treating RN: Grover Canavan Primary Care Provider: [name] Other Clinician: Referring Provider: [name] Treating Provider/Extender: Melburn Hake, Keyanah Kozicki [name]: 2 Diagnosis Coding ICD-10 Codes Code Description I89.0 Lymphedema, not elsewhere  classified I87.2 Venous insufficiency (chronic) (peripheral) L97.822 Non-pressure chronic ulcer of other part of left lower leg with fat layer exposed Facility Procedures CPT4 Code: 41638453 Description: (Facility Use Only) [phone] - [place] LWR LT LEG Modifier: Quantity: 1 Physician Procedures CPT4 Code: 1224825 Description: 00370 - WC PHYS LEVEL 3 - EST PT Modifier: Quantity: 1 CPT4 Code: Description: ICD-10 Diagnosis Description I89.0 Lymphedema, not elsewhere classified I87.2 Venous insufficiency (chronic) (peripheral) L97.822 Non-pressure chronic ulcer of other part of left lower leg with fat lay Modifier: er exposed Quantity: Electronic Signature(s) Signed: 02/19/2020 11:40:13 AM By: Worthy Keeler PA-C Entered By: Worthy Keeler on 02/19/2020 11:40:13

## 2020-02-23 ENCOUNTER — Ambulatory Visit (INDEPENDENT_AMBULATORY_CARE_PROVIDER_SITE_OTHER): Payer: Medicare PPO | Admitting: Internal Medicine

## 2020-02-23 ENCOUNTER — Encounter: Payer: Self-pay | Admitting: Internal Medicine

## 2020-02-23 ENCOUNTER — Other Ambulatory Visit: Payer: Self-pay

## 2020-02-23 DIAGNOSIS — E1165 Type 2 diabetes mellitus with hyperglycemia: Secondary | ICD-10-CM

## 2020-02-23 DIAGNOSIS — J45909 Unspecified asthma, uncomplicated: Secondary | ICD-10-CM

## 2020-02-23 MED ORDER — AZITHROMYCIN 250 MG PO TABS: ORAL_TABLET | ORAL | 0 refills | Status: DC

## 2020-02-23 MED ORDER — GLUCOSE BLOOD VI STRP
ORAL_STRIP | 12 refills | Status: DC
Start: 2020-02-23 — End: 2022-06-13

## 2020-02-23 MED ORDER — ONETOUCH DELICA LANCETS 33G MISC: 11 refills | Status: DC

## 2020-02-23 NOTE — Progress Notes (Signed)
[facility] [place], [place] [address]  Internal MEDICINE  Telephone Visit  Patient Name: Adam Merritt  [id number]  [id number]  Date of Service: 02/24/2020  I connected with the patient at 1215by telephone and verified the patients identity using two identifiers.   I discussed the limitations, risks, security and privacy concerns of performing an evaluation and management service by telephone and the availability of in person appointments. I also discussed with the patient that there may be a patient responsible charge related to the service.  The patient expressed understanding and agrees to proceed.    Chief Complaint  Patient presents with  . Telephone Assessment    Phone-[phone]  . Telephone Screen  . Cough    clear   . Wheezing  . Quality Metric Gaps    AWV,foot exam and flu shot     HPI Pt is connected via virtual visit due to complaints of cough and chest congestion, he also has low grade fever, pt has h/o asthma /copd as well. Chest feels tight and is unable to bring up any sputum. He is not being regular using his nebulizer treatment, wife in the room with him. Pt sufferers from lymphedema  And has diabetes with chronic pain  C/o sob   Current Medication: Outpatient Encounter Medications as of 02/23/2020  Medication Sig  . albuterol (VENTOLIN HFA) 108 (90 Base) MCG/ACT inhaler Inhale 2 puffs into the lungs every 6 (six) hours as needed for wheezing or shortness of breath.  Marland Kitchen aspirin 325 MG EC tablet Take 325 mg by mouth daily.  Marland Kitchen atorvastatin (LIPITOR) 40 MG tablet Take 40 mg by mouth at bedtime.   . carvedilol (COREG) 25 MG tablet Take 25 mg by mouth 2 (two) times daily with a meal.  . docusate sodium (COLACE) 50 MG capsule Take 50 mg by mouth 2 (two) times daily.  . febuxostat (ULORIC) 40 MG tablet Take 1 tablet (40 mg total) by mouth daily.  . furosemide (LASIX) 40 MG tablet Take 1 tablet (40 mg total) by mouth daily.  Marland Kitchen gabapentin  (NEURONTIN) 300 MG capsule Take 2 cap in the morning, 1 cap in the evening and 2 cap at night.  . hydrALAZINE (APRESOLINE) 25 MG tablet Take 25 mg by mouth 3 (three) times daily.   . indapamide (LOZOL) 2.5 MG tablet Take 1 tablet (2.5 mg total) by mouth daily.  . Insulin Pen Needle (BD PEN NEEDLE NANO U/F) 32G X 4 MM MISC Use as directed with insulin DX e11.65  . ipratropium-albuterol (DUONEB) 0.5-2.5 (3) MG/3ML SOLN Take 3 mLs by nebulization every 4 (four) hours as needed (for shortness of breath).  . liraglutide (VICTOZA) 18 MG/3ML SOPN INJECT 1.8MG  INTO THE SKIN DAILY  . metolazone (ZAROXOLYN) 2.5 MG tablet   . Multiple Vitamin (MULTIVITAMIN WITH MINERALS) TABS tablet Take 1 tablet by mouth daily.  . ondansetron (ZOFRAN) 4 MG tablet Take 1 tablet (4 mg total) by mouth every 8 (eight) hours as needed.  . pentoxifylline (TRENTAL) 400 MG CR tablet TAKE 1 TABLET BY MOUTH THREE TIMES DAILY FOR CLAUDICATION  . potassium chloride SA (KLOR-CON) 20 MEQ tablet Take 1 tablet (20 mEq total) by mouth 2 (two) times daily. (Patient taking differently: Take 20 mEq by mouth 3 (three) times daily. )  . prazosin (MINIPRESS) 1 MG capsule Take 3 capsules (3 mg total) by mouth at bedtime.  . sennosides-docusate sodium (SENOKOT-S) 8.6-50 MG tablet Take 1-2 tablets by mouth 2 (two) times  daily.  . sertraline (ZOLOFT) 100 MG tablet Take 100 mg by mouth daily.   . traZODone (DESYREL) 50 MG tablet Take 50 mg by mouth at bedtime.  . [DISCONTINUED] Insulin Glargine (BASAGLAR KWIKPEN) 100 UNIT/ML INJECT 30 TO 36 UNITS UNDER THE SKIN EVERY DAY  . [DISCONTINUED] OneTouch Delica Lancets 56C MISC Use twice daily diag e11.65  . azithromycin (ZITHROMAX) 250 MG tablet Take one tab a day for 10 days for uri   No facility-administered encounter medications on file as of 02/23/2020.    Surgical History: Past Surgical History:  Procedure Laterality Date  . AMPUTATION TOE Right 06/23/2018   Procedure: AMPUTATION TOE;  Surgeon:  Sharlotte Alamo, DPM;  Location: [facility] ORS;  Service: Podiatry;  Laterality: Right;  . CHOLECYSTECTOMY    . CORONARY ARTERY BYPASS GRAFT      Medical History: Past Medical History:  Diagnosis Date  . Anemia   . CAD (coronary artery disease)   . Chest pain   . Coronary artery disease   . Diabetes mellitus without complication ([place])   . Diastolic CHF ([place])   . Esophageal reflux   . Herpes zoster without mention of complication   . Hyperlipidemia   . Hypertension   . Insomnia   . Lumbago   . Lymphedema   . Neuropathy in diabetes ([place])   . Obstructive chronic bronchitis without exacerbation ([place])   . Other malaise and fatigue   . Stroke ([place])   . Varicose veins     Family History: Family History  Problem Relation Age of Onset  . Diabetes Mother   . Hyperlipidemia Mother   . Hypertension Mother   . Diabetes Father   . Hyperlipidemia Father   . Hypertension Father   . CAD Father     Social History   Socioeconomic History  . Marital status: Married    Spouse name: Not on file  . Number of children: Not on file  . Years of education: Not on file  . Highest education level: Not on file  Occupational History  . Occupation: retired  Tobacco Use  . Smoking status: Never Smoker  . Smokeless tobacco: Never Used  Vaping Use  . Vaping Use: Never used  Substance and Sexual Activity  . Alcohol use: No    Comment: quit alcohol 30 years ago  . Drug use: No  . Sexual activity: Not Currently  Other Topics Concern  . Not on file  Social History Narrative   Lives at home with wife, uses a wheelchair now   Social Determinants of Health   Financial Resource Strain: Low Risk   . Difficulty of Paying Living Expenses: Not hard at all  Food Insecurity: No Food Insecurity  . Worried About Charity fundraiser in the Last Year: Never true  . Ran Out of Food in the Last Year: Never true  Transportation Needs: No Transportation Needs  . Lack of Transportation (Medical): No  . Lack  of Transportation (Non-Medical): No  Physical Activity: Inactive  . Days of Exercise per Week: 0 days  . Minutes of Exercise per Session: 0 min  Stress: No Stress Concern Present  . Feeling of Stress : Only a little  Social Connections: Moderately Integrated  . Frequency of Communication with Friends and Family: More than three times a week  . Frequency of Social Gatherings with Friends and Family: More than three times a week  . Attends Religious Services: More than 4 times per year  . Active Member  of Clubs or Organizations: No  . Attends Archivist Meetings: Never  . Marital Status: Married  Human resources officer Violence: Not At Risk  . Fear of Current or Ex-Partner: No  . Emotionally Abused: No  . Physically Abused: No  . Sexually Abused: No    Review of Systems  Constitutional: Positive for fatigue and fever.  HENT: Positive for postnasal drip.   Respiratory: Positive for cough and shortness of breath.   Cardiovascular: Positive for leg swelling.  Musculoskeletal: Positive for arthralgias.    Vital Signs: There were no vitals taken for this visit.   Observation/Objective: Pt is congested and cough seems to be productive   Assessment/Plan: 1. Acute asthmatic bronchitis Will start following therapy, pt is instructed to use his nebulizer therapy 3 x day. Might need steroids however will await cxr results  - DG Chest 2 View; Future - azithromycin (ZITHROMAX) 250 MG tablet; Take one tab a day for 10 days for uri  Dispense: 10 tablet; Refill: 0  2. Uncontrolled type 2 diabetes mellitus with hyperglycemia ([place]) Monitor blood sugar closely   General Counseling: [place] verbalizes understanding of the findings of today's phone visit and agrees with plan of treatment. I have discussed any further diagnostic evaluation that may be needed or ordered today. We also reviewed his medications today. he has been encouraged to call the office with any questions or concerns that  should arise related to todays visit.  Orders Placed This Encounter  Procedures  . DG Chest 2 View    Meds ordered this encounter  Medications  . azithromycin (ZITHROMAX) 250 MG tablet    Sig: Take one tab a day for 10 days for uri    Dispense:  10 tablet    Refill:  0    Time spent:20 Minutes  Dr [name] Internal medicine

## 2020-02-24 ENCOUNTER — Ambulatory Visit
Admission: RE | Admit: 2020-02-24 | Discharge: 2020-02-24 | Disposition: A | Discharge status: Home / Self Care | Payer: Medicare PPO | Source: Ambulatory Visit | Attending: Internal Medicine | Admitting: Internal Medicine

## 2020-02-24 ENCOUNTER — Other Ambulatory Visit: Payer: Self-pay

## 2020-02-24 ENCOUNTER — Other Ambulatory Visit
Admission: RE | Admit: 2020-02-24 | Discharge: 2020-02-24 | Disposition: A | Discharge status: Home / Self Care | Payer: Medicare PPO | Source: Home / Self Care | Attending: Internal Medicine | Admitting: Internal Medicine

## 2020-02-24 DIAGNOSIS — J45909 Unspecified asthma, uncomplicated: Secondary | ICD-10-CM | POA: Insufficient documentation

## 2020-02-24 DIAGNOSIS — E1165 Type 2 diabetes mellitus with hyperglycemia: Secondary | ICD-10-CM

## 2020-02-24 LAB — BASIC METABOLIC PANEL
Anion gap: 9 (ref 5–15)
BUN: 14 mg/dL (ref 8–23)
CO2: 26 mmol/L (ref 22–32)
Calcium: 8.8 mg/dL — ABNORMAL LOW (ref 8.9–10.3)
Chloride: 101 mmol/L (ref 98–111)
Creatinine, Ser: 1.96 mg/dL — ABNORMAL HIGH (ref 0.61–1.24)
GFR calc Af Amer: 38 mL/min — ABNORMAL LOW (ref >60)
GFR calc non Af Amer: 32 mL/min — ABNORMAL LOW (ref >60)
Glucose, Bld: 123 mg/dL — ABNORMAL HIGH (ref 70–99)
Potassium: 3.7 mmol/L (ref 3.5–5.1)
Sodium: 136 mmol/L (ref 135–145)

## 2020-02-24 LAB — CBC
HCT: 37.8 % — ABNORMAL LOW (ref 39.0–52.0)
Hemoglobin: 12.7 g/dL — ABNORMAL LOW (ref 13.0–17.0)
MCH: 31.2 pg (ref 26.0–34.0)
MCHC: 33.6 g/dL (ref 30.0–36.0)
MCV: 92.9 fL (ref 80.0–100.0)
Platelets: 151 10*3/uL (ref 150–400)
RBC: 4.07 MIL/uL — ABNORMAL LOW (ref 4.22–5.81)
RDW: 14 % (ref 11.5–15.5)
WBC: 7.4 10*3/uL (ref 4.0–10.5)
nRBC: 0 % (ref 0.0–0.2)

## 2020-02-24 MED ORDER — BASAGLAR KWIKPEN 100 UNIT/ML ~~LOC~~ SOPN: PEN_INJECTOR | SUBCUTANEOUS | 3 refills | Status: DC

## 2020-02-26 ENCOUNTER — Encounter: Payer: Medicare PPO | Admitting: Physician Assistant

## 2020-02-26 ENCOUNTER — Other Ambulatory Visit: Payer: Self-pay

## 2020-02-26 DIAGNOSIS — E11622 Type 2 diabetes mellitus with other skin ulcer: Secondary | ICD-10-CM | POA: Diagnosis not present

## 2020-02-26 DIAGNOSIS — E1151 Type 2 diabetes mellitus with diabetic peripheral angiopathy without gangrene: Secondary | ICD-10-CM | POA: Diagnosis not present

## 2020-02-26 DIAGNOSIS — I509 Heart failure, unspecified: Secondary | ICD-10-CM | POA: Diagnosis not present

## 2020-02-26 DIAGNOSIS — L97829 Non-pressure chronic ulcer of other part of left lower leg with unspecified severity: Secondary | ICD-10-CM | POA: Diagnosis not present

## 2020-02-26 DIAGNOSIS — I872 Venous insufficiency (chronic) (peripheral): Secondary | ICD-10-CM | POA: Diagnosis not present

## 2020-02-26 DIAGNOSIS — G4730 Sleep apnea, unspecified: Secondary | ICD-10-CM | POA: Diagnosis not present

## 2020-02-26 DIAGNOSIS — I110 Hypertensive heart disease with heart failure: Secondary | ICD-10-CM | POA: Diagnosis not present

## 2020-02-26 DIAGNOSIS — L97222 Non-pressure chronic ulcer of left calf with fat layer exposed: Secondary | ICD-10-CM | POA: Diagnosis not present

## 2020-02-26 DIAGNOSIS — E669 Obesity, unspecified: Secondary | ICD-10-CM | POA: Diagnosis not present

## 2020-02-26 DIAGNOSIS — L97812 Non-pressure chronic ulcer of other part of right lower leg with fat layer exposed: Secondary | ICD-10-CM | POA: Diagnosis not present

## 2020-02-26 DIAGNOSIS — I2510 Atherosclerotic heart disease of native coronary artery without angina pectoris: Secondary | ICD-10-CM | POA: Diagnosis not present

## 2020-02-26 NOTE — Progress Notes (Addendum)
PONCIANO, SHEALY ([id number]) Visit Report for 02/26/2020 Chief Complaint Document Details Patient Name: Adam Merritt, Adam Merritt Date of Service: 02/26/2020 3:15 PM Medical Record Number: [id number] Patient Account Number: [id number] Date of Birth/Sex: 1946-05-14 (74 y.o. M) Treating RN: Cornell Barman Primary Care Provider: [name] Other Clinician: Referring Provider: [name] Treating Provider/Extender: Melburn Hake, Jaydin Jalomo [name]: 3 Information Obtained from: Patient Chief Complaint Left leg ulcers Electronic Signature(s) Signed: 02/26/2020 3:41:50 PM By: Worthy Keeler PA-C Entered By: Worthy Keeler on 02/26/2020 15:41:49 Adam Merritt ([id number]) -------------------------------------------------------------------------------- HPI Details Patient Name: Adam Merritt Date of Service: 02/26/2020 3:15 PM Medical Record Number: [id number] Patient Account Number: [id number] Date of Birth/Sex: April 11, 1946 (74 y.o. M) Treating RN: Cornell Barman Primary Care Provider: [name] Other Clinician: Referring Provider: [name] Treating Provider/Extender: Melburn Hake, Theodor Mustin [name]: 3 History of Present Illness HPI Description: 10/18/17-He is here for initial evaluation of bilateral lower extremity ulcerations in the presence of venous insufficiency and lymphedema. He has been seen by vascular medicine in the past, Dr. Lucky Cowboy, last seen in 2016. He does have a history of abnormal ABIs, which is to be expected given his lymphedema and venous insufficiency. According to Epic, it appears that all attempts for arterial evaluation and/or angiography were not follow through with by patient. He does have a history of being seen in lymphedema clinic in 2018, stopped going approximately 6 months ago stating "it didn't do any good". He does not have lymphedema pumps, he does not have custom fit compression wrap/stockings. He is diabetic and his recent A1c last month was 7.6. He admits to chronic  bilateral lower extremity pain, no change in pain since blister and ulceration development. He is currently being treated with Levaquin for bronchitis. He has home health and we will continue. 10/25/17-He is here in follow-up evaluation for bilateral lower extremity ulcerationssubtle he remains on Levaquin for bronchitis. Right lower extremity with no evidence of drainage or ulceration, persistent left lower extremity ulceration. He states that home health has not been out since his appointment. He went to [facility] on Tuesday, studies revealed: RIGHT ABI 0.9, TBI 0.6 LEFT ABI 1.1, TBI 0.6 with triphasic flow bilaterally. We will continue his same treatment plan. He has been educated on compression therapy and need for elevation. He will benefit from lymphedema pumps 11/01/17-He is here in follow-up evaluation for left lower extremity ulcer. The right lower extremity remains healed. He has home health services, but they have not been out to see the patient for 2-3 weeks. He states it home health physical therapy changed his dressing yesterday after therapy; he placed Ace wrap compression. We are still waiting for lymphedema pumps, reordered d/t need for company change. 11/08/17-He is here in follow-up evaluation for left lower extremity ulcer. It is improved. Edema is significantly improved with compression therapy. We will continue with same treatment plan and he will follow-up next week. No word regarding lymphedema pumps 11/15/17-He is here in follow-up evaluation for left lower extremity ulcer. He is healed and will be discharged from wound care services. I have reached out to [facility] regarding his lymphedema pumps. They have been unable to reach the patient; the contact number they had with the patient's wife's cell phone and she has not answered any unrecognized calls. Contact should be made today, trial planned for next week; [facility] will continue to  follow 11/27/17 on evaluation today patient has multiple blistered areas over the right lower  extremity his left lower extremity appears to be doing okay. These blistered areas show signs of no infection which is great news. With that being said he did have some necrotic skin overlying which was mechanically debrided away with saline and gauze today without complication. Overall post debridement the wounds appear to be doing better but in general his swelling seems to be increased. This is obviously not good news. I think this is what has given rise to the blisters. 12/04/17 on evaluation today patient presents for follow-up concerning his bilateral lower extremity edema in the right lower extremity ulcers. He has been tolerating the dressing changes without complication. With that being said he has had no real issues with the wraps which is also good news. Overall I'm pleased with the progress he's been making. 12/11/17 on evaluation today patient appears to be doing rather well in regard to his right lateral lower extremity ulcer. He's been tolerating the dressing changes without complication. Fortunately there does not appear to be any evidence of infection at this time. Overall I'm pleased with the progress that is being made. Unfortunately he has been in the hospital due to having what sounds to be a stomach virus/flu fortunately that is starting to get better. 12/18/17 on evaluation today patient actually appears to be doing very well in regard to his bilateral lower extremities the swelling is under fairly good control his lymphedema pumps are still not up and running quite yet. With that being said he does have several areas of opening noted as far as wounds are concerned mainly over the left lower extremity. With that being said I do believe once he gets lymphedema pumps this would least help you mention some the fluid and preventing this from occurring. Hopefully that will be set up soon sleeves  are Artie in place at his home he just waiting for the machine. 12/25/17 on evaluation today patient actually appears to be doing excellent in fact all of his ulcers appear to have resolved his legs appear very well. I do think he needs compression stockings we have discussed this and they are actually going to go to [facility] today to elastic therapy to get this fitted for him. I think that is definitely a good thing to do. Readmission: 04/09/18 upon evaluation today this patient is seen for readmission due to bilateral lower extremity lymphedema. He has significant swelling of his extremities especially on the left although the right is also swollen he has weeping from both sides. There are no obvious open wounds at this point. Fortunately he has been doing fairly well for quite a bit of time since I last saw him. Nonetheless unfortunately this seems to have reopened and is giving quite a bit of trouble. He states this began about a week ago when he first called [place] to get in to be seen. No fevers, chills, nausea, or vomiting noted at this time. He has not been using his lymphedema pumps due to the fact that they won't fit on his leg at this point likewise is also not been using his compression for essentially the same reason. 04/16/18 upon evaluation today patient actually appears to be doing a little better in regard to the fluid in his bilateral lower extremities. With that being said he's had three falls since I saw him last week. He also states that he's been feeling very poorly. I was concerned last week and feel like that the concern is still there as far as the congestion in  his chest is concerned he seems to be breathing about the same as last week but again he states he's very weak he's not even able to walk further than from the chair to the door. His wife had to buy a wheelchair just to be able to get them out of the house to get to the appointment today. This has me very  concerned. 04/23/18 on evaluation today patient actually appears to be doing much better than last week's evaluation. At that time actually had to transport him to the ER via EMS and he subsequently was admitted for acute pulmonary edema, acute renal failure, and acute congestive heart failure. Fortunately he is doing much better. Apparently they did dialyze him and were able to take off roughly 35 pounds of fluid. Nonetheless he is feeling much better both in regard to his breathing and he's able to get around much better at this time compared to previous. Overall I'm very Adam Merritt, Adam Merritt ([id number]) happy with how things are at this time. There does not appear to be any evidence of infection currently. No fevers, chills, nausea, or vomiting noted at this time. 04/30/2018 patient seen today for follow-up and management of bilateral lower extremity lymphedema. He did express being more sad today than usual due to the recent loss of his dog. He states that he has been compliant with using the lymphedema pumps. However he does admit a minute over the last 2-3 days he has not been using the pumps due to the recent loss of his dog. At this time there is no drainage or open wounds to his lower extremities. The left leg edema is measuring smaller today. Still has a significant amount of edema on bilateral lower extremities With dry flaky skin. He will be referred to the lymphedema clinic for further management. Will continue 3 layer compression wraps and follow-up in 1-2 weeks.Denies any pain, fever, chairs recently. No recent falls or injuries reported during this visit. 05/07/18 on evaluation today patient actually appears to be doing very well in regard to his lower extremities in general all things considered. With that being said he is having some pain in the legs just due to the amount of swelling. He does have an area where he had a blister on the left lateral lower extremity this is open at this  point other than that there's nothing else weeping at this time. 05/14/18 on evaluation today patient actually appears to be doing excellent all things considered in regard to his lower extremities. He still has a couple areas of weeping on each leg which has continued to be the issue for him. He does have an appointment with the lymphedema clinic although this isn't until February 2020. That was the earliest they had. In the meantime he has continued to tolerate the compression wraps without complication. 05/28/18 on evaluation today patient actually appears to be doing more poorly in regard to his left lower extremity where he has a wound open at this point. He also had a fall where he subsequently injured his right great toe which has led to an open wound at the site unfortunately. He has been tolerating the dressing changes without complication in general as far as the wraps are concerned that he has not been putting any dressing on the left 1st toe ulcer site. 06/11/18 on evaluation today patient appears to be doing much worse in regard to his bilateral lower extremity ulcers. He has been tolerating the dressing changes without complication although  his legs have not been wrapped more recently. Overall I am not very pleased with the way his legs appear. I do believe he needs to be back in compression wraps he still has not received his compression wraps from the [facility] as of yet. 06/18/18 on evaluation today patient actually appears to be doing significantly better than last time I saw him. He has been tolerating the compression wraps without complication in the circumferential ulcers especially appear to be doing much better. His toe ulcer on the right in regard to the great toe is better although not as good as the legs in my pinion. No fevers chills noted 07/02/18 on evaluation today patient appears to be doing much better in regard to his lower extremity ulcers. Unfortunately since I last  saw him he's had the distal portion of his right great toe if he dated it sounds as if this actually went downhill very quickly. I had only seen him a few days prior and the toe did not appear to be infected at that point subsequently became infected very rapidly and it was decided by the surgeon that the distal portion of the toe needed to be removed. The patient seems to be doing well in this regard he tells me. With that being said his lower extremities are doing better from the standpoint of the wounds although he is significantly swollen at this point. 07/09/18 on evaluation today patient appears to be doing better in regard to the wounds on his lower extremities. In fact everything is almost completely healed he is just a small area on the left posterior lower extremity that is open at this point. 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 With that being said he needs some kind of compression it does sound like he may not be wearing his compression, that is the wraps, during the entire time between when he's here visit to visit. Apparently his wife took the current one off because it began to "fall apart". 07/16/18 on evaluation today patient appears to be extremely swollen especially in regard to his left lower extremity unfortunately. He also has a new skin tear over the left lower extremity and there's a smaller area on the right lower extremity as well. Unfortunately this seems to be due in part to blistering and fluid buildup in his leg. He did get the reduction wraps that were ordered by the [facility] hospital for him to go to lymphedema clinic. With that being said his wounds on the legs have not healed to the point to where they would likely accept them as a patient lymphedema clinic currently. We need to try to  get this to heal. With that being said he's been taking his wraps off which is not doing him any favors at this point. In fact this is probably quite counterproductive compared to what needs to occur. We will likely need to increase to a four layer compression wrap and continue to also utilize elevation and he has to keep the wraps on not take them off as he's been doing currently hasn't had a wrap on since Saturday. 07/23/18 on evaluation today patient appears to be doing much better in regard to his bilateral lower extremities. In fact his left lower extremity which was the largest is actually 15 cm smaller today compared to  what it was last time he was here in our clinic. This is obviously good news after just one week. Nonetheless the differences he actually kept the wraps on during the entire week this time. That's not typical for him. I do believe he understands a little bit better now the severity of the situation and why it's important for him to keep these wraps on. 07/30/18 on evaluation today patient actually appears to be doing rather well in regard to his lower extremities. His legs are much smaller than they have been in the past and he actually has only one very small rudder superficial region remaining that is not closed on the left lateral/posterior lower extremity even this is almost completely close. I do believe likely next week he will be healed without any complications. I do think we need to continue the wraps however this seems to be beneficial for him. I also think it may be a good time for [place] to go ahead and see about getting the appointment with the lymphedema clinic which is supposed to be made for him in order to keep this moving along and hopefully get them into compression wraps that will in the end help him to remain healed. 08/06/18 on evaluation today patient actually appears to be doing very well in regard as bilateral lower extremities. In fact his wounds appear to  be completely healed at this time. 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 He is in the process of getting appointment with the lymphedema clinic we have made this referral were just waiting to hear back on the schedule time. We need to follow up on that today as well. 08/13/18 on evaluation today patient actually appears to be doing very well in regard to his bilateral lower extremities there are no open wounds at this point. We are gonna go ahead and see about ordering the Velcro compression wraps for him had a discussion with them about [place] doing it versus the [place] they feel like they can definitely afford going ahead and get the wraps themselves and they would prefer to try to avoid having to go to the lymphedema clinic if it all possible which I completely understand. As long as he has good compression I'm okay either way. 3/17//20 on evaluation today patient actually appears to be doing well in regard to his bilateral lower extremity ulcers. He has been tolerating the dressing changes without complication specifically the compression wraps. Overall is had no issues my fingers finding that I see at this point is that he is having trouble with constipation. He tells me he has not been able to go to the bathroom for about six days. He's taken to over-the- counter oral laxatives unfortunately this is not helping. He has not contacted his doctor. Adam Merritt, Adam Merritt ([id number]) 08/27/18 on evaluation today patient appears to be doing fairly well in regard to his lower extremities at this point. There does not appear to be any new altars is swelling is very well controlled. We are still waiting for his Velcro compression wraps to arrive that should be sometime in the next week is Artie sent the check for these. 09/03/18 on evaluation today patient appears to be doing excellent in regard to his bilateral lower extremities which he shows no signs of  wound openings in regard to this point. He does have his Velcro compression wraps which did arrive in the mail since I saw him last week. Overall  he is doing excellent in my pinion. 09/13/18 on evaluation today patient appears to be doing very well currently in regard to the overall appearance of his bilateral lower extremities although he's a little bit more swollen than last time we saw him. At that point he been discharged without any open wounds. Nonetheless he has a small open wound on the posterior left lower extremity with some evidence of cellulitis noted as well. Fortunately I feel like he has made good progress overall with regard to his lower extremities from were things used to be. 09/20/18 on evaluation today patient actually appears to be doing much better. The erythematous lower extremity is improving wound itself which is still open appears to be doing much better as far as but appearance as well as pain is concerned overall very pleased in this regard. There's no signs of active infection at this time. 09/27/18 on evaluation today patient's wounds on the lower extremity actually appear to be doing fairly well at this time which is good news. There is no evidence of active infection currently and again is just as left lower extremity were there any wounds at this point anyway. I believe they may be completely healed but again I'm not 100% sure based on evaluation today. I think one more week of observation would likely be a good idea. 10/04/18 on evaluation today patient actually appears to be doing excellent in regard to the left lower extremity which actually appears to be completely healed as of today. Unfortunately he's been having issues with his right lower extremity have a new wound that has opened. Fortunately there's no evidence of active infection at this time which is good news. 10/10/18 upon evaluation today patient actually appears to be doing a little bit worse with the new  open area on his right posterior lower extremity. He's been tolerating the dressing changes without complication. Right now we been using his compression wraps although I think we may need to switch back to actually performing bilateral compression wraps are in the clinic. No fevers, chills, nausea, or vomiting noted at this time. 10/17/18 on evaluation today patient actually appears to be doing quite well in regard to his bilateral lower extremity ulcers. He has been tolerating the reps without complication although he would prefer not to rewrap his legs as of today. Fortunately there's no signs of active infection which is good news. No fevers, chills, nausea, or vomiting noted at this time. 10/24/18 on evaluation today patient appears to be doing very well in regard to his lower extremities. His right lower extremity is shown signs of healing and his left lower [place] though not healed appears to be improving which is excellent news. Overall very pleased with how things seem to be progressing at this time. The patient is likewise happy to hear this. His Velcro compression wraps however have not been put on properly will gonna show his wife how to do that properly today. 10/31/18 on evaluation today patient appears to be doing more poorly in regard to his left lower extremity in particular although both lower extremities actually are showing some signs of being worse than my previous evaluation. Unfortunately I'm just not sure that his compression stockings even with the use of the compression/lymphedema pumps seem to be controlling this well. Upon further questioning he tells me that he also is not able to lie flat in the bed due to his congestive heart failure and difficulty breathing. For that reason he sleeps in his recliner.  To make matters worse his recliner also cannot even hold his legs up so instead of even being somewhat elevated they pretty much hang down to the floor. This is the way he  sleeps each night which is definitely counterproductive to everything else that were attempting to do from the standpoint of controlling his fluid. Nonetheless I think that he potentially could benefit from a hospital bed although this would be something that his primary care provider would likely have to order since anything that is order on our side has to be directly related to wound care and again the hospital bed is not necessarily a direct relation to although I think it does contribute to his overall wound status on his lower extremities. 11/07/18 on evaluation today patient appears to be doing better in regard to his bilateral lower extremity. He's been tolerating the dressing changes without complication. We did in the interim since I last saw him switch to just using extras orbiting new alginate and that seems to have done much better for him. I'm very pleased with the overall progress that is made. 11/14/18 on evaluation today patient appears to be redoing rather well in regard to his left lower extremity ulcers which are the only ones remaining at this point. Fortunately there's no signs of active infection at this time which is good news. Overall been very pleased with how things seem to be progressing currently. No fevers, chills, nausea, or vomiting noted at this time. 11/20/17 on evaluation today patient actually appears to be doing quite well in regard to his lower extremities on the left on the right he has several blisters that showed up although there's some question about whether or not he has had his broker compression wraps on like he was supposed to or not. Fortunately there's no signs of active infection at this time which is good news. Unfortunately though he is doing well from the standpoint of the left leg the right leg is not doing as well again this is when we did not wrap last week. 11/28/18 on evaluation today patient appears to be doing well in regard to his bilateral lower  extremities is left appears to be healed is right is not healed but is very close to being so. Overall very pleased with how things seem to be progressing. Patient is likewise happy that things are doing well. 12/09/18 on evaluation today patient actually appears to be completely healed which is excellent news. He actually seems to be doing well in regard to the swelling of the bilateral lower extremities which is also great news. Overall very pleased with how things seem to be progressing. Readmission: 03/18/2019 upon evaluation today patient presents for reevaluation concerning issues that he is having with his left lower extremity where he does have a wound noted upon inspection today. He also has a wound on the right second toe on the tip where it is apparently been rubbing in his shoe I did look at issues and you can actually see where this has been occurring as well. Fortunately there is no signs of infection with regard to the toe necessarily although it is very swollen compared to normal that does have me somewhat concerned about the possibility of further evaluation for infection/osteomyelitis. I would recommend an x-ray to start with. Otherwise he states that it is been 1-2 weeks that he has had the draining in regard to left lower extremity he does not know how this happened he has been wearing his compression  appropriately and I do see that as well today but nonetheless I do think that he needs to continue to be very cautious with regard to elevation as well. Adam Merritt, Adam Merritt ([id number]) 03/25/2019 on evaluation today patient appears to be doing much better in regard to his lower extremity at this time. That is on the left. He did culture positive for Pseudomonas but the good news is he seems to be doing much better the gentamicin we applied topically seems to have done a great job for him. There is no signs of active infection at this time systemically and locally he is doing much better.  No fevers, chills, nausea, vomiting, or diarrhea. In regard to his right toe this seems to be doing much better there is very little pressure noted at this point I did clean up some of the callus today around the edges of the wound as well as the surface of the wound but to be honest he is progressing quite nicely. 10/30; the area on the left anterior lower tibia area is healed over. His edema control is adequate even though his use of the compression pumps seems very intermittent. He has a juxta lite stocking on the right leg and a think there is 1 for the left leg at home. His wife is putting these on. He has an area on the plantar right second toe which is a hammertoe they are trying to offload this 04/08/2019 on evaluation today patient appears to be doing well in regard to his lower extremities which is good news. We are seeing him today for his toe ulcer which is giving him a little bit more trouble but again still seems to be doing better at this point which is good news. He is going require some sharp debridement in regard to the toe today however. 04/15/2019 on evaluation today patient actually appears to be doing quite well with regard to his toe ulcer. I am very pleased with how things are doing in that regard. With regard to his lower extremity edema in general he tells me he is not been using the lymphedema pumps regularly although he does not use them. I think that he needs to use them more regularly he does have a small blister on the left lower extremity this is not open at this point but obviously it something that can get worse if he does not keep the compression going and the pumps going as well. 04/22/2019 on evaluation today patient appears to be doing well with regard to his toe ulcer. He has been tolerating the dressing changes without complication. This is measuring slightly smaller this week as compared to last week. Fortunately there is no signs of active infection at this  time. 05/06/2019 on evaluation today patient actually appears to be doing excellent. In fact his toe appears to be completely healed which is great news. There is no signs of active infection at this time. Readmission: Patient presents today for follow-up after having been discharged at the beginning of December 2020. He states that in recent weeks things have reopened and has been having more trouble at this point. With that being said fortunately there is no signs of active infection at this time. No fever chills noted. Nonetheless he also does have an issue with one of his toes as well that seems to be somewhat this is on the right foot second toe. 07/25/2019 upon evaluation today patient's lower extremities appear to be doing excellent bilaterally. I really feel  like he is showing signs of great improvement and in fact I think he is close to healing which is great news. There is no signs of active infection at this time. No fevers, chills, nausea, vomiting, or diarrhea. 07/31/2019 upon evaluation today patient appears to be doing excellent and in fact I think may be completely healed in regard to his right lower extremity that were still to monitor this 1 more week before closing it out. The left lower extremity is measuring smaller although not completely closed seems to be doing excellent. 08/07/2019 upon evaluation today patient appears to be doing excellent in regard to his wounds. In fact the right lower extremity is completely healed there is no issues here. The left lower extremity has 1 very small area still remaining fortunately there is no signs of infection and overall I think he is very close to closure of the here as well. 08/14/19 upon evaluation today patient appears to be doing excellent in regard to his lower extremities. In fact he appears to be completely healed as of today. Fortunately there's no signs of active infection at this time. No fevers, chills, nausea, or vomiting noted  at this time. READMISSION 09/26/2019 This is a 74 year old man who is well-known to this clinic having just been discharged on 08/14/2019. He has known lymphedema and chronic venous insufficiency. He has Farrow wrap stockings and also external compression pumps. He does not use the external compression pumps however. He does however apparently fairly faithfully use the stockings. They have not been lotion in his legs. He has developed new wounds on the right anterior as well as the left medial left lateral and left posterior calf. He returns for our review of this Past medical history includes coronary artery disease, hypertension, congestive heart failure, COPD, venous insufficiency with lymphedema, type 2 diabetes. He apparently has compression pumps, Farrow wraps and at 1 point a hospital bed although I believe he sleeps in a recliner 10/03/2019 upon evaluation today patient appears to be doing better with regard to his wounds in general. He has been tolerating the dressing changes without complication. Fortunately there is no signs of active infection. No fevers, chills, nausea, vomiting, or diarrhea. I do believe the compression wraps are helping as they always have in the past. 10/10/2019 upon evaluation today patient appears to be doing excellent at this point. His right lower extremity is measuring much better and in fact is completely healed. His left lower extremity is also measuring better though not healed seems to be doing excellent at this point which is great news. Overall I am very pleased with how things appear currently. 10/27/2019 upon evaluation today patient appears to be doing quite well with regard to his wounds currently. He has been tolerating the dressing changes without complication. Fortunately there is no signs of active infection at this time. In fact he is almost completely healed on the left and has just a very small area open and on the right he is completely  healed. 11/04/2019 upon evaluation today patient appears to be doing quite well with regard to his wounds. In fact he appears to be quite possibly completely healed on the left lower extremity now as well the right lower extremity is still doing well. With that being said unfortunately though this may be healed I think it is a very fragile area and I cannot even confirm there is not a very small opening still remaining. I really think he may benefit from 1 additional weeks  wrapping before discontinuing. 11/10/2019 upon evaluation today patient appears to be doing well in regard to his original wounds in fact everything that we were treating last week is completely healed on the left. Unfortunately he has a new skin tear just above where the wrap slipped down due to a fall he sustained causing this injury. 11/17/2019 on evaluation today patient appears to be doing better with regard to his wound. He has been tolerating the dressing changes without Adam Merritt, Adam Merritt. ([id number]) complication. Fortunately there is no signs of active infection at this time. No fevers, chills, nausea, vomiting, or diarrhea. 11/24/2019 upon evaluation today patient appears to be doing well with regard to his wound. This is making good progress there does not appear to be signs of active infection but overall I do feel like he is headed in the correct direction. Overall he is tolerating the compression wrap as well. 12/02/2019 upon evaluation today patient appears to be doing well with regard to his leg ulcer. In fact this appears to be completely healed and this is the last of the wounds that he had but still the left anterior lower extremity. Fortunately there is no signs of active infection at this time. No fevers, chills, nausea, vomiting, or diarrhea. Readmission: 02/05/2020 on evaluation today patient appears to be doing well with regard to his wound all things considered. He actually tells me that a couple weeks ago he had  trouble with his wraps and therefore took him off for a couple of days in order to give his legs a break. It was during this time that he actually developed increased swelling and edema of his legs and blisters on both legs. That is when he called to make the appointment. Nonetheless since that time he began wearing his compression wraps which are Velcro in the meantime and has done extremely well with this. In fact his leg appears to be under good control as far as edema is concerned today it is nothing like what I am seeing when he has come in for evaluations in the past. They have been using some of the silver alginate at home which has been beneficial for him. Fortunately there is no signs of active infection at this time. No fevers, chills, nausea, vomiting, or diarrhea. 02/19/2020 on evaluation today patient unfortunately does not appear to be doing quite as well as I would like to see. He has no signs of active infection at this time but unfortunately he is not doing well in regard to the overall appearance of his left leg. He has a couple other areas that are weeping now and the leg is much more swollen. I think that he needs to actually have a compression wrap to try to manage this. 02/26/2020 on evaluation today patient appears to be doing well with regard to his left lower extremity ulcer. Fortunately the swelling is down I do believe the pressure wrap seems to be doing quite well. Fortunately there is no signs of active infection at this time. Electronic Signature(s) Signed: 02/26/2020 4:11:47 PM By: Worthy Keeler PA-C Entered By: Worthy Keeler on 02/26/2020 16:11:46 Adam Merritt ([id number]) -------------------------------------------------------------------------------- Physical Exam Details Patient Name: Adam Merritt Date of Service: 02/26/2020 3:15 PM Medical Record Number: [id number] Patient Account Number: [id number] Date of Birth/Sex: [birth year] (74 y.o. M) Treating RN:  Cornell Barman Primary Care Provider: [name] Other Clinician: Referring Provider: [name] Treating Provider/Extender: Melburn Hake, Parveen Freehling [name]: 3 Constitutional Well-nourished and well-hydrated  in no acute distress. Respiratory normal breathing without difficulty. Psychiatric this patient is able to make decisions and demonstrates good insight into disease process. Alert and Oriented x 3. pleasant and cooperative. Notes Upon inspection patient's wound bed actually showed signs of excellent granulation at this point and epithelization there in fact does not appear to be much left open on the left leg hopefully he will be healed by next week. Electronic Signature(s) Signed: 02/26/2020 4:12:05 PM By: Worthy Keeler PA-C Entered By: Worthy Keeler on 02/26/2020 16:12:05 Adam Merritt ([id number]) -------------------------------------------------------------------------------- Physician Orders Details Patient Name: Adam Merritt Date of Service: 02/26/2020 3:15 PM Medical Record Number: [id number] Patient Account Number: [id number] Date of Birth/Sex: 07-30-1945 (74 y.o. M) Treating RN: Grover Canavan Primary Care Provider: [name] Other Clinician: Referring Provider: [name] Treating Provider/Extender: Melburn Hake, Jonet Mathies [name]: 3 Verbal / Phone Orders: No Diagnosis Coding ICD-10 Coding Code Description I89.0 Lymphedema, not elsewhere classified I87.2 Venous insufficiency (chronic) (peripheral) L97.822 Non-pressure chronic ulcer of other part of left lower leg with fat layer exposed Wound Cleansing o Cleanse wound with mild soap and water o Dial antibacterial soap, wash wounds, rinse and pat dry prior to dressing wounds Primary Wound Dressing Wound #36 Left,Anterior Lower Leg o Silver Alginate Secondary Dressing Wound #36 Left,Anterior Lower Leg o ABD pad Dressing Change Frequency Wound #36 Left,Anterior Lower Leg o Change  dressing every week Follow-up Appointments Wound #36 Left,Anterior Lower Leg o Return Appointment in 1 week. Edema Control Wound #36 Left,Anterior Lower Leg o 4-Layer Compression System - Left Lower Extremity. o Patient to wear own Velcro compression garment. - on right leg o Elevate legs to the level of the heart and pump ankles as often as possible Electronic Signature(s) Signed: 02/26/2020 5:09:50 PM By: Grover Canavan Signed: 02/26/2020 5:29:43 PM By: Worthy Keeler PA-C Entered By: Grover Canavan on 02/26/2020 15:46:23 Adam Merritt, Adam Merritt ([id number]) -------------------------------------------------------------------------------- Problem List Details Patient Name: Adam Merritt Date of Service: 02/26/2020 3:15 PM Medical Record Number: [id number] Patient Account Number: [id number] Date of Birth/Sex: 1946-03-24 (74 y.o. M) Treating RN: Cornell Barman Primary Care Provider: [name] Other Clinician: Referring Provider: [name] Treating Provider/Extender: Melburn Hake, Cerra Eisenhower [name]: 3 Active Problems ICD-10 Encounter Code Description Active Date MDM Diagnosis I89.0 Lymphedema, not elsewhere classified 02/05/2020 No Yes I87.2 Venous insufficiency (chronic) (peripheral) 02/05/2020 No Yes L97.822 Non-pressure chronic ulcer of other part of left lower leg with fat layer 02/05/2020 No Yes exposed Inactive Problems Resolved Problems Electronic Signature(s) Signed: 02/26/2020 3:41:07 PM By: Worthy Keeler PA-C Entered By: Worthy Keeler on 02/26/2020 15:41:05 Adam Merritt ([id number]) -------------------------------------------------------------------------------- Progress Note Details Patient Name: Adam Merritt Date of Service: 02/26/2020 3:15 PM Medical Record Number: [id number] Patient Account Number: [id number] Date of Birth/Sex: 06-Mar-1946 (74 y.o. M) Treating RN: Cornell Barman Primary Care Provider: [name] Other Clinician: Referring Provider: [name] Treating Provider/Extender: Melburn Hake, Rosmarie Esquibel [name]: 3 Subjective Chief Complaint Information obtained from Patient Left leg ulcers History of Present Illness (HPI) 10/18/17-He is here for initial evaluation of bilateral lower extremity ulcerations in the presence of venous insufficiency and lymphedema. He has been seen by vascular medicine in the past, Dr. Lucky Cowboy, last seen in 2016. He does have a history of abnormal ABIs, which is to be expected given his lymphedema and venous insufficiency. According to Epic, it appears that all attempts for arterial evaluation and/or angiography were not follow through with by patient. He does  have a history of being seen in lymphedema clinic in 2018, stopped going approximately 6 months ago stating "it didn't do any good". He does not have lymphedema pumps, he does not have custom fit compression wrap/stockings. He is diabetic and his recent A1c last month was 7.6. He admits to chronic bilateral lower extremity pain, no change in pain since blister and ulceration development. He is currently being treated with Levaquin for bronchitis. He has home health and we will continue. 10/25/17-He is here in follow-up evaluation for bilateral lower extremity ulcerationssubtle he remains on Levaquin for bronchitis. Right lower extremity with no evidence of drainage or ulceration, persistent left lower extremity ulceration. He states that home health has not been out since his appointment. He went to [facility] on Tuesday, studies revealed: RIGHT ABI 0.9, TBI 0.6 LEFT ABI 1.1, TBI 0.6 with triphasic flow bilaterally. We will continue his same treatment plan. He has been educated on compression therapy and need for elevation. He will benefit from lymphedema pumps 11/01/17-He is here in follow-up evaluation for left lower extremity ulcer. The right lower extremity remains healed. He has home health services, but they have not been out to see the  patient for 2-3 weeks. He states it home health physical therapy changed his dressing yesterday after therapy; he placed Ace wrap compression. We are still waiting for lymphedema pumps, reordered d/t need for company change. 11/08/17-He is here in follow-up evaluation for left lower extremity ulcer. It is improved. Edema is significantly improved with compression therapy. We will continue with same treatment plan and he will follow-up next week. No word regarding lymphedema pumps 11/15/17-He is here in follow-up evaluation for left lower extremity ulcer. He is healed and will be discharged from wound care services. I have reached out to [facility] regarding his lymphedema pumps. They have been unable to reach the patient; the contact number they had with the patient's wife's cell phone and she has not answered any unrecognized calls. Contact should be made today, trial planned for next week; [facility] will continue to follow 11/27/17 on evaluation today patient has multiple blistered areas over the right lower extremity his left lower extremity appears to be doing okay. These blistered areas show signs of no infection which is great news. With that being said he did have some necrotic skin overlying which was mechanically debrided away with saline and gauze today without complication. Overall post debridement the wounds appear to be doing better but in general his swelling seems to be increased. This is obviously not good news. I think this is what has given rise to the blisters. 12/04/17 on evaluation today patient presents for follow-up concerning his bilateral lower extremity edema in the right lower extremity ulcers. He has been tolerating the dressing changes without complication. With that being said he has had no real issues with the wraps which is also good news. Overall I'm pleased with the progress he's been making. 12/11/17 on evaluation today patient appears to be doing rather well  in regard to his right lateral lower extremity ulcer. He's been tolerating the dressing changes without complication. Fortunately there does not appear to be any evidence of infection at this time. Overall I'm pleased with the progress that is being made. Unfortunately he has been in the hospital due to having what sounds to be a stomach virus/flu fortunately that is starting to get better. 12/18/17 on evaluation today patient actually appears to be doing very well in regard to his  bilateral lower extremities the swelling is under fairly good control his lymphedema pumps are still not up and running quite yet. With that being said he does have several areas of opening noted as far as wounds are concerned mainly over the left lower extremity. With that being said I do believe once he gets lymphedema pumps this would least help you mention some the fluid and preventing this from occurring. Hopefully that will be set up soon sleeves are Artie in place at his home he just waiting for the machine. 12/25/17 on evaluation today patient actually appears to be doing excellent in fact all of his ulcers appear to have resolved his legs appear very well. I do think he needs compression stockings we have discussed this and they are actually going to go to [place] today to elastic therapy to get this fitted for him. I think that is definitely a good thing to do. Readmission: 04/09/18 upon evaluation today this patient is seen for readmission due to bilateral lower extremity lymphedema. He has significant swelling of his extremities especially on the left although the right is also swollen he has weeping from both sides. There are no obvious open wounds at this point. Fortunately he has been doing fairly well for quite a bit of time since I last saw him. Nonetheless unfortunately this seems to have reopened and is giving quite a bit of trouble. He states this began about a week ago when he first called [place] to get in  to be seen. No fevers, chills, nausea, or vomiting noted at this time. He has not been using his lymphedema pumps due to the fact that they won't fit on his leg at this point likewise is also not been using his compression for essentially the same reason. 04/16/18 upon evaluation today patient actually appears to be doing a little better in regard to the fluid in his bilateral lower extremities. With that being said he's had three falls since I saw him last week. He also states that he's been feeling very poorly. I was concerned last week and feel like that the concern is still there as far as the congestion in his chest is concerned he seems to be breathing about the same as last week but again he states he's very weak he's not even able to walk further than from the chair to the door. His wife had to buy a wheelchair just to be able to get them out of the house to get to the appointment today. This has me very concerned. DAMEION, BRILES ([id number]) 04/23/18 on evaluation today patient actually appears to be doing much better than last week's evaluation. At that time actually had to transport him to the ER via EMS and he subsequently was admitted for acute pulmonary edema, acute renal failure, and acute congestive heart failure. Fortunately he is doing much better. Apparently they did dialyze him and were able to take off roughly 35 pounds of fluid. Nonetheless he is feeling much better both in regard to his breathing and he's able to get around much better at this time compared to previous. Overall I'm very happy with how things are at this time. There does not appear to be any evidence of infection currently. No fevers, chills, nausea, or vomiting noted at this time. 04/30/2018 patient seen today for follow-up and management of bilateral lower extremity lymphedema. He did express being more sad today than usual due to the recent loss of his dog. He states that he  has been compliant with using the  lymphedema pumps. However he does admit a minute over the last 2-3 days he has not been using the pumps due to the recent loss of his dog. At this time there is no drainage or open wounds to his lower extremities. The left leg edema is measuring smaller today. Still has a significant amount of edema on bilateral lower extremities With dry flaky skin. He will be referred to the lymphedema clinic for further management. Will continue 3 layer compression wraps and follow-up in 1-2 weeks.Denies any pain, fever, chairs recently. No recent falls or injuries reported during this visit. 05/07/18 on evaluation today patient actually appears to be doing very well in regard to his lower extremities in general all things considered. With that being said he is having some pain in the legs just due to the amount of swelling. He does have an area where he had a blister on the left lateral lower extremity this is open at this point other than that there's nothing else weeping at this time. 05/14/18 on evaluation today patient actually appears to be doing excellent all things considered in regard to his lower extremities. He still has a couple areas of weeping on each leg which has continued to be the issue for him. He does have an appointment with the lymphedema clinic although this isn't until February 2020. That was the earliest they had. In the meantime he has continued to tolerate the compression wraps without complication. 05/28/18 on evaluation today patient actually appears to be doing more poorly in regard to his left lower extremity where he has a wound open at this point. He also had a fall where he subsequently injured his right great toe which has led to an open wound at the site unfortunately. He has been tolerating the dressing changes without complication in general as far as the wraps are concerned that he has not been putting any dressing on the left 1st toe ulcer site. 06/11/18 on evaluation today  patient appears to be doing much worse in regard to his bilateral lower extremity ulcers. He has been tolerating the dressing changes without complication although his legs have not been wrapped more recently. Overall I am not very pleased with the way his legs appear. I do believe he needs to be back in compression wraps he still has not received his compression wraps from the [facility] hospital as of yet. 06/18/18 on evaluation today patient actually appears to be doing significantly better than last time I saw him. He has been tolerating the compression wraps without complication in the circumferential ulcers especially appear to be doing much better. His toe ulcer on the right in regard to the great toe is better although not as good as the legs in my pinion. No fevers chills noted 07/02/18 on evaluation today patient appears to be doing much better in regard to his lower extremity ulcers. Unfortunately since I last saw him he's had the distal portion of his right great toe if he dated it sounds as if this actually went downhill very quickly. I had only seen him a few days prior and the toe did not appear to be infected at that point subsequently became infected very rapidly and it was decided by the surgeon that the distal portion of the toe needed to be removed. The patient seems to be doing well in this regard he tells me. With that being said his lower extremities are doing better from the standpoint  of the wounds although he is significantly swollen at this point. 07/09/18 on evaluation today patient appears to be doing better in regard to the wounds on his lower extremities. In fact everything is almost completely healed he is just a small area on the left posterior lower extremity that is open at this point. 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 With that being said he needs some kind of compression it does sound like he may not be wearing his compression, that is the wraps, during the entire time between when he's here visit to visit. Apparently his wife took the current one off because it began to "fall apart". 07/16/18 on evaluation today patient appears to be extremely swollen especially in regard to his left lower extremity unfortunately. He also has a new skin tear over the left lower extremity and there's a smaller area on the right lower extremity as well. Unfortunately this seems to be due in part to blistering and fluid buildup in his leg. He did get the reduction wraps that were ordered by the [facility] hospital for him to go to lymphedema clinic. With that being said his wounds on the legs have not healed to the point to where they would likely accept them as a patient lymphedema clinic currently. We need to try to get this to heal. With that being said he's been taking his wraps off which is not doing him any favors at this point. In fact this is probably quite counterproductive compared to what needs to occur. We will likely need to increase to a four layer compression wrap and continue to also utilize elevation and he has to keep the wraps on not take them off as he's been doing currently hasn't had a wrap on since Saturday. 07/23/18 on evaluation today patient appears to be doing much better in regard to his bilateral lower extremities. In fact his left lower extremity which was the largest is actually 15 cm smaller today compared to what it was last time he was here in our clinic. This is obviously good news after just one week. Nonetheless the differences he actually kept the wraps on during the entire week this time. That's not typical for him. I do believe he understands a little bit better now the severity of the situation and why it's important for him to keep these wraps on. 07/30/18 on  evaluation today patient actually appears to be doing rather well in regard to his lower extremities. His legs are much smaller than they have been in the past and he actually has only one very small rudder superficial region remaining that is not closed on the left lateral/posterior lower extremity even this is almost completely close. I do believe likely next week he will be healed without any complications. I do think we need to continue the wraps however this seems to be beneficial for him. I also think it may be a good time for [place] to go ahead and see about getting the appointment with the lymphedema clinic which is supposed to be made for him in order to keep this moving along and hopefully get them into compression wraps that will in the end help him to remain healed. 08/06/18 on evaluation today patient actually appears to be doing very well in regard as bilateral lower extremities. In  fact his wounds appear to be completely healed at this time. 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 He is in the process of getting appointment with the lymphedema clinic we have made this referral were just waiting to hear back on the schedule time. We need to follow up on that today as well. 08/13/18 on evaluation today patient actually appears to be doing very well in regard to his bilateral lower extremities there are no open wounds at this point. We are gonna go ahead and see about ordering the Velcro compression wraps for him had a discussion with them about [place] doing it versus the [place] they feel like they can definitely afford going ahead and get the wraps themselves and they would prefer to try to avoid having to go to the lymphedema clinic if it all possible which I completely understand. As long as he has good compression I'm okay either way. MILUS, FRITZE ([id number]) 3/17//20 on evaluation today patient actually appears to be doing well in  regard to his bilateral lower extremity ulcers. He has been tolerating the dressing changes without complication specifically the compression wraps. Overall is had no issues my fingers finding that I see at this point is that he is having trouble with constipation. He tells me he has not been able to go to the bathroom for about six days. He's taken to over-the- counter oral laxatives unfortunately this is not helping. He has not contacted his doctor. 08/27/18 on evaluation today patient appears to be doing fairly well in regard to his lower extremities at this point. There does not appear to be any new altars is swelling is very well controlled. We are still waiting for his Velcro compression wraps to arrive that should be sometime in the next week is Artie sent the check for these. 09/03/18 on evaluation today patient appears to be doing excellent in regard to his bilateral lower extremities which he shows no signs of wound openings in regard to this point. He does have his Velcro compression wraps which did arrive in the mail since I saw him last week. Overall he is doing excellent in my pinion. 09/13/18 on evaluation today patient appears to be doing very well currently in regard to the overall appearance of his bilateral lower extremities although he's a little bit more swollen than last time we saw him. At that point he been discharged without any open wounds. Nonetheless he has a small open wound on the posterior left lower extremity with some evidence of cellulitis noted as well. Fortunately I feel like he has made good progress overall with regard to his lower extremities from were things used to be. 09/20/18 on evaluation today patient actually appears to be doing much better. The erythematous lower extremity is improving wound itself which is still open appears to be doing much better as far as but appearance as well as pain is concerned overall very pleased in this regard. There's no signs of  active infection at this time. 09/27/18 on evaluation today patient's wounds on the lower extremity actually appear to be doing fairly well at this time which is good news. There is no evidence of active infection currently and again is just as left lower extremity were there any wounds at this point anyway. I believe they may be completely healed but again I'm not 100% sure based on evaluation today. I think one more week of observation would likely be a good  idea. 10/04/18 on evaluation today patient actually appears to be doing excellent in regard to the left lower extremity which actually appears to be completely healed as of today. Unfortunately he's been having issues with his right lower extremity have a new wound that has opened. Fortunately there's no evidence of active infection at this time which is good news. 10/10/18 upon evaluation today patient actually appears to be doing a little bit worse with the new open area on his right posterior lower extremity. He's been tolerating the dressing changes without complication. Right now we been using his compression wraps although I think we may need to switch back to actually performing bilateral compression wraps are in the clinic. No fevers, chills, nausea, or vomiting noted at this time. 10/17/18 on evaluation today patient actually appears to be doing quite well in regard to his bilateral lower extremity ulcers. He has been tolerating the reps without complication although he would prefer not to rewrap his legs as of today. Fortunately there's no signs of active infection which is good news. No fevers, chills, nausea, or vomiting noted at this time. 10/24/18 on evaluation today patient appears to be doing very well in regard to his lower extremities. His right lower extremity is shown signs of healing and his left lower Trinity though not healed appears to be improving which is excellent news. Overall very pleased with how things seem to be  progressing at this time. The patient is likewise happy to hear this. His Velcro compression wraps however have not been put on properly will gonna show his wife how to do that properly today. 10/31/18 on evaluation today patient appears to be doing more poorly in regard to his left lower extremity in particular although both lower extremities actually are showing some signs of being worse than my previous evaluation. Unfortunately I'm just not sure that his compression stockings even with the use of the compression/lymphedema pumps seem to be controlling this well. Upon further questioning he tells me that he also is not able to lie flat in the bed due to his congestive heart failure and difficulty breathing. For that reason he sleeps in his recliner. To make matters worse his recliner also cannot even hold his legs up so instead of even being somewhat elevated they pretty much hang down to the floor. This is the way he sleeps each night which is definitely counterproductive to everything else that were attempting to do from the standpoint of controlling his fluid. Nonetheless I think that he potentially could benefit from a hospital bed although this would be something that his primary care provider would likely have to order since anything that is order on our side has to be directly related to wound care and again the hospital bed is not necessarily a direct relation to although I think it does contribute to his overall wound status on his lower extremities. 11/07/18 on evaluation today patient appears to be doing better in regard to his bilateral lower extremity. He's been tolerating the dressing changes without complication. We did in the interim since I last saw him switch to just using extras orbiting new alginate and that seems to have done much better for him. I'm very pleased with the overall progress that is made. 11/14/18 on evaluation today patient appears to be redoing rather well in regard  to his left lower extremity ulcers which are the only ones remaining at this point. Fortunately there's no signs of active infection at this time which is  good news. Overall been very pleased with how things seem to be progressing currently. No fevers, chills, nausea, or vomiting noted at this time. 11/20/17 on evaluation today patient actually appears to be doing quite well in regard to his lower extremities on the left on the right he has several blisters that showed up although there's some question about whether or not he has had his broker compression wraps on like he was supposed to or not. Fortunately there's no signs of active infection at this time which is good news. Unfortunately though he is doing well from the standpoint of the left leg the right leg is not doing as well again this is when we did not wrap last week. 11/28/18 on evaluation today patient appears to be doing well in regard to his bilateral lower extremities is left appears to be healed is right is not healed but is very close to being so. Overall very pleased with how things seem to be progressing. Patient is likewise happy that things are doing well. 12/09/18 on evaluation today patient actually appears to be completely healed which is excellent news. He actually seems to be doing well in regard to the swelling of the bilateral lower extremities which is also great news. Overall very pleased with how things seem to be progressing. Readmission: 03/18/2019 upon evaluation today patient presents for reevaluation concerning issues that he is having with his left lower extremity where he does have a wound noted upon inspection today. He also has a wound on the right second toe on the tip where it is apparently been rubbing in his shoe I did look at issues and you can actually see where this has been occurring as well. Fortunately there is no signs of infection with regard to Adam Merritt, Adam Merritt. ([id number]) the toe necessarily although  it is very swollen compared to normal that does have me somewhat concerned about the possibility of further evaluation for infection/osteomyelitis. I would recommend an x-ray to start with. Otherwise he states that it is been 1-2 weeks that he has had the draining in regard to left lower extremity he does not know how this happened he has been wearing his compression appropriately and I do see that as well today but nonetheless I do think that he needs to continue to be very cautious with regard to elevation as well. 03/25/2019 on evaluation today patient appears to be doing much better in regard to his lower extremity at this time. That is on the left. He did culture positive for Pseudomonas but the good news is he seems to be doing much better the gentamicin we applied topically seems to have done a great job for him. There is no signs of active infection at this time systemically and locally he is doing much better. No fevers, chills, nausea, vomiting, or diarrhea. In regard to his right toe this seems to be doing much better there is very little pressure noted at this point I did clean up some of the callus today around the edges of the wound as well as the surface of the wound but to be honest he is progressing quite nicely. 10/30; the area on the left anterior lower tibia area is healed over. His edema control is adequate even though his use of the compression pumps seems very intermittent. He has a juxta lite stocking on the right leg and a think there is 1 for the left leg at home. His wife is putting these on. He has an  area on the plantar right second toe which is a hammertoe they are trying to offload this 04/08/2019 on evaluation today patient appears to be doing well in regard to his lower extremities which is good news. We are seeing him today for his toe ulcer which is giving him a little bit more trouble but again still seems to be doing better at this point which is good news. He is  going require some sharp debridement in regard to the toe today however. 04/15/2019 on evaluation today patient actually appears to be doing quite well with regard to his toe ulcer. I am very pleased with how things are doing in that regard. With regard to his lower extremity edema in general he tells me he is not been using the lymphedema pumps regularly although he does not use them. I think that he needs to use them more regularly he does have a small blister on the left lower extremity this is not open at this point but obviously it something that can get worse if he does not keep the compression going and the pumps going as well. 04/22/2019 on evaluation today patient appears to be doing well with regard to his toe ulcer. He has been tolerating the dressing changes without complication. This is measuring slightly smaller this week as compared to last week. Fortunately there is no signs of active infection at this time. 05/06/2019 on evaluation today patient actually appears to be doing excellent. In fact his toe appears to be completely healed which is great news. There is no signs of active infection at this time. Readmission: Patient presents today for follow-up after having been discharged at the beginning of December 2020. He states that in recent weeks things have reopened and has been having more trouble at this point. With that being said fortunately there is no signs of active infection at this time. No fever chills noted. Nonetheless he also does have an issue with one of his toes as well that seems to be somewhat this is on the right foot second toe. 07/25/2019 upon evaluation today patient's lower extremities appear to be doing excellent bilaterally. I really feel like he is showing signs of great improvement and in fact I think he is close to healing which is great news. There is no signs of active infection at this time. No fevers, chills, nausea, vomiting, or diarrhea. 07/31/2019  upon evaluation today patient appears to be doing excellent and in fact I think may be completely healed in regard to his right lower extremity that were still to monitor this 1 more week before closing it out. The left lower extremity is measuring smaller although not completely closed seems to be doing excellent. 08/07/2019 upon evaluation today patient appears to be doing excellent in regard to his wounds. In fact the right lower extremity is completely healed there is no issues here. The left lower extremity has 1 very small area still remaining fortunately there is no signs of infection and overall I think he is very close to closure of the here as well. 08/14/19 upon evaluation today patient appears to be doing excellent in regard to his lower extremities. In fact he appears to be completely healed as of today. Fortunately there's no signs of active infection at this time. No fevers, chills, nausea, or vomiting noted at this time. READMISSION 09/26/2019 This is a 74 year old man who is well-known to this clinic having just been discharged on 08/14/2019. He has known lymphedema and chronic venous  insufficiency. He has Farrow wrap stockings and also external compression pumps. He does not use the external compression pumps however. He does however apparently fairly faithfully use the stockings. They have not been lotion in his legs. He has developed new wounds on the right anterior as well as the left medial left lateral and left posterior calf. He returns for our review of this Past medical history includes coronary artery disease, hypertension, congestive heart failure, COPD, venous insufficiency with lymphedema, type 2 diabetes. He apparently has compression pumps, Farrow wraps and at 1 point a hospital bed although I believe he sleeps in a recliner 10/03/2019 upon evaluation today patient appears to be doing better with regard to his wounds in general. He has been tolerating the dressing changes  without complication. Fortunately there is no signs of active infection. No fevers, chills, nausea, vomiting, or diarrhea. I do believe the compression wraps are helping as they always have in the past. 10/10/2019 upon evaluation today patient appears to be doing excellent at this point. His right lower extremity is measuring much better and in fact is completely healed. His left lower extremity is also measuring better though not healed seems to be doing excellent at this point which is great news. Overall I am very pleased with how things appear currently. 10/27/2019 upon evaluation today patient appears to be doing quite well with regard to his wounds currently. He has been tolerating the dressing changes without complication. Fortunately there is no signs of active infection at this time. In fact he is almost completely healed on the left and has just a very small area open and on the right he is completely healed. 11/04/2019 upon evaluation today patient appears to be doing quite well with regard to his wounds. In fact he appears to be quite possibly completely healed on the left lower extremity now as well the right lower extremity is still doing well. With that being said unfortunately though this may be healed I think it is a very fragile area and I cannot even confirm there is not a very small opening still remaining. I really think he may benefit from 1 additional weeks wrapping before discontinuing. Adam Merritt, Adam Merritt ([id number]) 11/10/2019 upon evaluation today patient appears to be doing well in regard to his original wounds in fact everything that we were treating last week is completely healed on the left. Unfortunately he has a new skin tear just above where the wrap slipped down due to a fall he sustained causing this injury. 11/17/2019 on evaluation today patient appears to be doing better with regard to his wound. He has been tolerating the dressing changes without complication. Fortunately  there is no signs of active infection at this time. No fevers, chills, nausea, vomiting, or diarrhea. 11/24/2019 upon evaluation today patient appears to be doing well with regard to his wound. This is making good progress there does not appear to be signs of active infection but overall I do feel like he is headed in the correct direction. Overall he is tolerating the compression wrap as well. 12/02/2019 upon evaluation today patient appears to be doing well with regard to his leg ulcer. In fact this appears to be completely healed and this is the last of the wounds that he had but still the left anterior lower extremity. Fortunately there is no signs of active infection at this time. No fevers, chills, nausea, vomiting, or diarrhea. Readmission: 02/05/2020 on evaluation today patient appears to be doing well with regard to  his wound all things considered. He actually tells me that a couple weeks ago he had trouble with his wraps and therefore took him off for a couple of days in order to give his legs a break. It was during this time that he actually developed increased swelling and edema of his legs and blisters on both legs. That is when he called to make the appointment. Nonetheless since that time he began wearing his compression wraps which are Velcro in the meantime and has done extremely well with this. In fact his leg appears to be under good control as far as edema is concerned today it is nothing like what I am seeing when he has come in for evaluations in the past. They have been using some of the silver alginate at home which has been beneficial for him. Fortunately there is no signs of active infection at this time. No fevers, chills, nausea, vomiting, or diarrhea. 02/19/2020 on evaluation today patient unfortunately does not appear to be doing quite as well as I would like to see. He has no signs of active infection at this time but unfortunately he is not doing well in regard to the  overall appearance of his left leg. He has a couple other areas that are weeping now and the leg is much more swollen. I think that he needs to actually have a compression wrap to try to manage this. 02/26/2020 on evaluation today patient appears to be doing well with regard to his left lower extremity ulcer. Fortunately the swelling is down I do believe the pressure wrap seems to be doing quite well. Fortunately there is no signs of active infection at this time. Objective Constitutional Well-nourished and well-hydrated in no acute distress. Vitals Time Taken: 3:22 PM, Height: 65 in, Weight: 289 lbs, BMI: 48.1, Temperature: 97.8 F, Pulse: 60 bpm, Respiratory Rate: 18 breaths/min, Blood Pressure: 164/64 mmHg. Respiratory normal breathing without difficulty. Psychiatric this patient is able to make decisions and demonstrates good insight into disease process. Alert and Oriented x 3. pleasant and cooperative. General Notes: Upon inspection patient's wound bed actually showed signs of excellent granulation at this point and epithelization there in fact does not appear to be much left open on the left leg hopefully he will be healed by next week. Integumentary (Hair, Skin) Wound #36 status is Open. Original cause of wound was Gradually Appeared. The wound is located on the Left,Anterior Lower Leg. The wound measures 0.2cm length x 0.2cm width x 0.1cm depth; 0.031cm^2 area and 0.003cm^3 volume. Assessment Active Problems ICD-10 Lymphedema, not elsewhere classified Venous insufficiency (chronic) (peripheral) Non-pressure chronic ulcer of other part of left lower leg with fat layer exposed Adam Merritt, Adam Merritt ([id number]) Procedures Wound #36 Pre-procedure diagnosis of Wound #36 is a Venous Leg Ulcer located on the Left,Anterior Lower Leg . There was a Four Layer Compression Therapy Procedure by Grover Canavan, RN. Post procedure Diagnosis Wound #36: Same as Pre-Procedure Plan Wound  Cleansing: Cleanse wound with mild soap and water Dial antibacterial soap, wash wounds, rinse and pat dry prior to dressing wounds Primary Wound Dressing: Wound #36 Left,Anterior Lower Leg: Silver Alginate Secondary Dressing: Wound #36 Left,Anterior Lower Leg: ABD pad Dressing Change Frequency: Wound #36 Left,Anterior Lower Leg: Change dressing every week Follow-up Appointments: Wound #36 Left,Anterior Lower Leg: Return Appointment in 1 week. Edema Control: Wound #36 Left,Anterior Lower Leg: 4-Layer Compression System - Left Lower Extremity. Patient to wear own Velcro compression garment. - on right leg Elevate legs to the  level of the heart and pump ankles as often as possible 1. I am going to suggest currently that we going to continue with the wound care measures as before. I do believe that the patient seems to be doing excellent with regard to the compression wrap and I think that we should continue as such. 2. I am also can recommend that we continue with the silver alginate dressing since that seems to be doing a good job keeping this nice and dry. We will see patient back for reevaluation in 1 week here in the clinic. If anything worsens or changes patient will contact our office for additional recommendations. Electronic Signature(s) Signed: 02/26/2020 4:12:39 PM By: Worthy Keeler PA-C Entered By: Worthy Keeler on 02/26/2020 16:12:38 Adam Merritt ([id number]) -------------------------------------------------------------------------------- [place] Details Patient Name: Adam Merritt Date of Service: 02/26/2020 Medical Record Number: [id number] Patient Account Number: [id number] Date of Birth/Sex: July 02, 1945 (74 y.o. M) Treating RN: Grover Canavan Primary Care Provider: [name] Other Clinician: Referring Provider: [name] Treating Provider/Extender: Melburn Hake, Jovaun Levene [name]: 3 Diagnosis Coding ICD-10 Codes Code Description I89.0 Lymphedema,  not elsewhere classified I87.2 Venous insufficiency (chronic) (peripheral) L97.822 Non-pressure chronic ulcer of other part of left lower leg with fat layer exposed Facility Procedures CPT4 Code: 14481856 Description: (Facility Use Only) [phone] - [place] LWR LT LEG Modifier: Quantity: 1 Physician Procedures CPT4 Code: 6378588 Description: 50277 - WC PHYS LEVEL 3 - EST PT Modifier: Quantity: 1 CPT4 Code: Description: ICD-10 Diagnosis Description I89.0 Lymphedema, not elsewhere classified I87.2 Venous insufficiency (chronic) (peripheral) L97.822 Non-pressure chronic ulcer of other part of left lower leg with fat lay Modifier: er exposed Quantity: Electronic Signature(s) Signed: 02/26/2020 4:12:58 PM By: Worthy Keeler PA-C Entered By: Worthy Keeler on 02/26/2020 16:12:57

## 2020-02-27 NOTE — Progress Notes (Signed)
Adam Merritt, Adam Merritt ([id number]) Visit Report for 02/26/2020 Arrival Information Details Patient Name: Adam Merritt, Adam Merritt Date of Service: 02/26/2020 3:15 PM Medical Record Number: [id number] Patient Account Number: [id number] Date of Birth/Sex: [birth year] (74 y.o. M) Treating RN: Grover Canavan Primary Care Renato Spellman: [name] Other Clinician: Referring Jashley Yellin: [name] Treating Payson Crumby/Extender: Melburn Hake, [name]: 3 Visit Information History Since Last Visit Added or deleted any medications: No Patient Arrived: Wheel Chair Had a fall or experienced change in No Arrival Time: 15:20 activities of daily living that may affect Accompanied By: spouse risk of falls: Transfer Assistance: None Hospitalized since last visit: No Patient Identification Verified: Yes Has Dressing in Place as Prescribed: Yes Secondary Verification Process Completed: Yes Pain Present Now: No Patient Requires Transmission-Based Precautions: No Patient Has Alerts: No Electronic Signature(s) Signed: 02/26/2020 5:09:50 PM By: Grover Canavan Entered By: Grover Canavan on 02/26/2020 15:22:21 Adam Merritt ([id number]) -------------------------------------------------------------------------------- Compression Therapy Details Patient Name: Adam Merritt Date of Service: 02/26/2020 3:15 PM Medical Record Number: [id number] Patient Account Number: [id number] Date of Birth/Sex: 03/27/1946 (74 y.o. M) Treating RN: Grover Canavan Primary Care Aydn Ferrara: [name] Other Clinician: Referring Nickolas Chalfin: [name] Treating Jaideep Pollack/Extender: Melburn Hake, [name]: 3 Compression Therapy Performed for Wound Assessment: Wound #36 Left,Anterior Lower Leg Performed By: Clinician Grover Canavan, RN Compression Type: Four Layer Post Procedure Diagnosis Same as Pre-procedure Electronic Signature(s) Signed: 02/26/2020 5:09:50 PM By: Grover Canavan Entered By: Grover Canavan on  02/26/2020 15:45:13 Adam Merritt ([id number]) -------------------------------------------------------------------------------- Encounter Discharge Information Details Patient Name: Adam Merritt Date of Service: 02/26/2020 3:15 PM Medical Record Number: [id number] Patient Account Number: [id number] Date of Birth/Sex: [birth year] (74 y.o. M) Treating RN: Grover Canavan Primary Care Hunt Zajicek: [name] Other Clinician: Referring Mearle Drew: [name] Treating Amadi Yoshino/Extender: Sharalyn Ink in Treatment: 3 Encounter Discharge Information Items Discharge Condition: Stable Ambulatory Status: Ambulatory Discharge Destination: Home Transportation: Private Auto Accompanied By: self Schedule Follow-up Appointment: Yes Clinical Summary of Care: Electronic Signature(s) Signed: 02/26/2020 5:09:50 PM By: Grover Canavan Entered By: Grover Canavan on 02/26/2020 15:43:42 Adam Merritt ([id number]) -------------------------------------------------------------------------------- Lower Extremity Assessment Details Patient Name: Adam Merritt Date of Service: 02/26/2020 3:15 PM Medical Record Number: [id number] Patient Account Number: [id number] Date of Birth/Sex: 11-12-1945 (74 y.o. M) Treating RN: Grover Canavan Primary Care Nelta Caudill: [name] Other Clinician: Referring Dwight Burdo: [name] Treating Isel Skufca/Extender: Melburn Hake, [name]: 3 Edema Assessment Assessed: [Left: No] [Right: No] Edema: [Left: Ye] [Right: s] Calf Left: Right: Point of Measurement: cm From Medial Instep 53 cm cm Ankle Left: Right: Point of Measurement: cm From Medial Instep 35.5 cm cm Vascular Assessment Pulses: Dorsalis Pedis Palpable: [Left:Yes] Posterior Tibial Palpable: [Left:Yes] Electronic Signature(s) Signed: 02/26/2020 5:09:50 PM By: Grover Canavan Entered By: Grover Canavan on 02/26/2020 15:38:42 Adam Merritt  ([id number]) -------------------------------------------------------------------------------- Multi Wound Chart Details Patient Name: Adam Merritt Date of Service: 02/26/2020 3:15 PM Medical Record Number: [id number] Patient Account Number: [id number] Date of Birth/Sex: 1946/04/24 (74 y.o. 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] [N/A:N/A] Date Acquired: [36:01/28/2020] [N/A:N/A] Weeks of Treatment: [  36:3] [N/A:N/A] Wound Status: [36:Open] [N/A:N/A] Measurements L x W x D (cm) [36:0.2x0.2x0.1] [N/A:N/A] Area (cm) : [16:1.096] [N/A:N/A] Volume (cm) : [04:5.409] [N/A:N/A] % Reduction in Area: [36:88.70%] [N/A:N/A] % Reduction in Volume: [36:88.90%] [N/A:N/A] Classification: [36:Full Thickness Without Exposed Support Structures] [N/A:N/A] Treatment Notes Electronic Signature(s) Signed: 02/26/2020 5:09:50 PM By: Grover Canavan Entered By: Grover Canavan on 02/26/2020 15:39:00 Adam Merritt ([id number]) -------------------------------------------------------------------------------- [place] Details Patient Name: Adam Merritt Date of Service: 02/26/2020 3:15 PM Medical Record Number: [id number] Patient Account Number: [id number] Date of Birth/Sex: 11-01-1945 (74 y.o. M) Treating RN: Grover Canavan Primary Care Dakoda Laventure: [name] Other Clinician: Referring Lamiracle Chaidez: [name] Treating  Jessyka Austria/Extender: Melburn Hake, [name]: 3 Active Inactive Wound/Skin Impairment Nursing Diagnoses: Impaired tissue integrity Knowledge deficit related to ulceration/compromised skin integrity Goals: Patient/caregiver will verbalize understanding of skin care regimen Date Initiated: 02/05/2020 Target Resolution Date: 02/20/2020 Goal Status: Active Interventions: Assess patient/caregiver ability to obtain necessary supplies Assess patient/caregiver ability to perform ulcer/skin care regimen upon admission and as needed Assess ulceration(s) every visit Provide education on ulcer and skin care Treatment Activities: Skin care regimen initiated : 02/05/2020 Notes: Electronic Signature(s) Signed: 02/26/2020 5:09:50 PM By: Grover Canavan Entered By: Grover Canavan on 02/26/2020 15:42:49 Adam Merritt ([id number]) -------------------------------------------------------------------------------- Pain Assessment Details Patient Name: Adam Merritt Date of Service: 02/26/2020 3:15 PM Medical Record Number: [id number] Patient Account Number: [id number] Date of Birth/Sex: [birth year] (74 y.o. M) Treating RN: Grover Canavan Primary Care Tanor Glaspy: [name] Other Clinician: Referring Tibor Lemmons: [name] Treating Anne-Marie Genson/Extender: Melburn Hake, [name]: 3 Active Problems Location of Pain Severity and Description of Pain Patient Has Paino No Site Locations Pain Management and Medication Current Pain Management: Electronic Signature(s) Signed: 02/26/2020 5:09:50 PM By: Grover Canavan Entered By: Grover Canavan on 02/26/2020 15:22:59 Adam Merritt ([id number]) -------------------------------------------------------------------------------- Patient/Caregiver Education Details Patient Name: Adam Merritt Date of Service: 02/26/2020 3:15 PM Medical Record Number: [id number] Patient Account Number: [id number] Date of Birth/Gender: Jun 10, 1945 (74 y.o.  M) Treating RN: Grover Canavan Primary Care Physician: [name] Other Clinician: Referring Physician: [name] Treating Physician/Extender: Sharalyn Ink in Treatment: 3 Education Assessment Education Provided To: Patient and Caregiver Education Topics Provided Wound/Skin Impairment: Methods: Explain/Verbal Responses: State content correctly Electronic Signature(s) Signed: 02/26/2020 5:09:50 PM By: Grover Canavan Entered By: Grover Canavan on 02/26/2020 15:42:38 Adam Merritt ([id number]) -------------------------------------------------------------------------------- Wound Assessment Details Patient Name: Adam Merritt Date of Service: 02/26/2020 3:15 PM Medical Record Number: [id number] Patient Account Number: [id number] Date of Birth/Sex: 1946-03-08 (74 y.o. M) Treating RN: Grover Canavan Primary Care Tristyn Pharris: [name] Other Clinician: Referring Moon Budde: [name] Treating Mikena Masoner/Extender: Melburn Hake, [name]: 3 Wound Status Wound Number: 36 Primary Etiology: Venous Leg Ulcer Wound Location: Left, Anterior Lower Leg Secondary Etiology: Diabetic Wound/Ulcer of the Lower Extremity Wounding Event: Gradually Appeared Wound Status: Open Date Acquired: 01/28/2020 Weeks Of Treatment: 3 Clustered Wound: No Wound Measurements Length: (cm) 0.2 Width: (cm) 0.2 Depth: (cm) 0.1 Area: (cm) 0.031 Volume: (cm) 0.003 % Reduction in Area: 88.7% % Reduction in Volume: 88.9% Wound Description Classification: Full Thickness Without Exposed Support Structu res Treatment Notes Wound #36 (Left, Anterior Lower Leg) Notes scell, abd pad, 4LL. farrow wraps right lower leg Electronic Signature(s) Signed: 02/26/2020 5:09:50 PM By: Grover Canavan Entered By: Grover Canavan on 02/26/2020 15:36:39 Adam Merritt, Adam Merritt ([id number]) -------------------------------------------------------------------------------- [place] Details Patient Name:  Adam Merritt Date of Service: 02/26/2020 3:15 PM Medical Record Number: [id number] Patient  Account Number: [id number] Date of Birth/Sex: [birth year] (74 y.o. M) Treating RN: Grover Canavan Primary Care Delena Casebeer: [name] Other Clinician: Referring Shae Hinnenkamp: [name] Treating Lawyer Washabaugh/Extender: Melburn Hake, [name]: 3 Vital Signs Time Taken: 15:22 Temperature (F): 97.8 Height (in): 65 Pulse (bpm): 60 Weight (lbs): 289 Respiratory Rate (breaths/min): 18 Body Mass Index (BMI): 48.1 Blood Pressure (mmHg): 164/64 Reference Range: 80 - 120 mg / dl Electronic Signature(s) Signed: 02/26/2020 5:09:50 PM By: Grover Canavan Entered By: Grover Canavan on 02/26/2020 15:22:51

## 2020-03-02 ENCOUNTER — Encounter: Payer: TRICARE For Life (TFL) | Admitting: Internal Medicine

## 2020-03-03 ENCOUNTER — Encounter: Payer: TRICARE For Life (TFL) | Admitting: Internal Medicine

## 2020-03-05 ENCOUNTER — Other Ambulatory Visit: Payer: Self-pay

## 2020-03-05 ENCOUNTER — Encounter: Payer: Medicare PPO | Attending: Physician Assistant | Admitting: Physician Assistant

## 2020-03-05 DIAGNOSIS — J449 Chronic obstructive pulmonary disease, unspecified: Secondary | ICD-10-CM | POA: Diagnosis not present

## 2020-03-05 DIAGNOSIS — I872 Venous insufficiency (chronic) (peripheral): Secondary | ICD-10-CM | POA: Diagnosis not present

## 2020-03-05 DIAGNOSIS — S99921A Unspecified injury of right foot, initial encounter: Secondary | ICD-10-CM | POA: Diagnosis not present

## 2020-03-05 DIAGNOSIS — X58XXXA Exposure to other specified factors, initial encounter: Secondary | ICD-10-CM | POA: Insufficient documentation

## 2020-03-05 DIAGNOSIS — E11621 Type 2 diabetes mellitus with foot ulcer: Secondary | ICD-10-CM | POA: Diagnosis not present

## 2020-03-05 DIAGNOSIS — I779 Disorder of arteries and arterioles, unspecified: Secondary | ICD-10-CM | POA: Insufficient documentation

## 2020-03-05 DIAGNOSIS — E11622 Type 2 diabetes mellitus with other skin ulcer: Secondary | ICD-10-CM | POA: Insufficient documentation

## 2020-03-05 DIAGNOSIS — I110 Hypertensive heart disease with heart failure: Secondary | ICD-10-CM | POA: Insufficient documentation

## 2020-03-05 DIAGNOSIS — I890 Lymphedema, not elsewhere classified: Secondary | ICD-10-CM | POA: Diagnosis not present

## 2020-03-05 DIAGNOSIS — I509 Heart failure, unspecified: Secondary | ICD-10-CM | POA: Insufficient documentation

## 2020-03-05 DIAGNOSIS — I2510 Atherosclerotic heart disease of native coronary artery without angina pectoris: Secondary | ICD-10-CM | POA: Insufficient documentation

## 2020-03-05 DIAGNOSIS — L97822 Non-pressure chronic ulcer of other part of left lower leg with fat layer exposed: Secondary | ICD-10-CM | POA: Insufficient documentation

## 2020-03-05 DIAGNOSIS — K5900 Constipation, unspecified: Secondary | ICD-10-CM | POA: Diagnosis not present

## 2020-03-05 DIAGNOSIS — L97829 Non-pressure chronic ulcer of other part of left lower leg with unspecified severity: Secondary | ICD-10-CM | POA: Diagnosis not present

## 2020-03-05 NOTE — Progress Notes (Addendum)
DAIVIK, OVERLEY ([id number]) Visit Report for 03/05/2020 Chief Complaint Document Details Patient Name: Adam Merritt, Adam Merritt Date of Service: 03/05/2020 2:15 PM Medical Record Number: [id number] Patient Account Number: [id number] Date of Birth/Sex: [birth year] (74 y.o. M) Treating RN: Grover Canavan Primary Care Provider: [name] Other Clinician: Referring Provider: [name] Treating Provider/Extender: Melburn Hake, Gazelle Towe [name]: 4 Information Obtained from: Patient Chief Complaint Left leg ulcers Electronic Signature(s) Signed: 03/05/2020 2:37:02 PM By: Worthy Keeler PA-C Entered By: Worthy Keeler on 03/05/2020 Crestline, Davarious W. ([id number]) -------------------------------------------------------------------------------- HPI Details Patient Name: Adam Merritt Date of Service: 03/05/2020 2:15 PM Medical Record Number: [id number] Patient Account Number: [id number] Date of Birth/Sex: 1946-06-04 (74 y.o. M) Treating RN: Grover Canavan Primary Care Provider: [name] Other Clinician: Referring Provider: [name] Treating Provider/Extender: Melburn Hake, Brigett Estell [name]: 4 History of Present Illness HPI Description: 10/18/17-He is here for initial evaluation of bilateral lower extremity ulcerations in the presence of venous insufficiency and lymphedema. He has been seen by vascular medicine in the past, Dr. Lucky Cowboy, last seen in 2016. He does have a history of abnormal ABIs, which is to be expected given his lymphedema and venous insufficiency. According to Epic, it appears that all attempts for arterial evaluation and/or angiography were not follow through with by patient. He does have a history of being seen in lymphedema clinic in 2018, stopped going approximately 6 months ago stating "it didn't do any good". He does not have lymphedema pumps, he does not have custom fit compression wrap/stockings. He is diabetic and his recent A1c last month was 7.6. He  admits to chronic bilateral lower extremity pain, no change in pain since blister and ulceration development. He is currently being treated with Levaquin for bronchitis. He has home health and we will continue. 10/25/17-He is here in follow-up evaluation for bilateral lower extremity ulcerationssubtle he remains on Levaquin for bronchitis. Right lower extremity with no evidence of drainage or ulceration, persistent left lower extremity ulceration. He states that home health has not been out since his appointment. He went to [facility] on Tuesday, studies revealed: RIGHT ABI 0.9, TBI 0.6 LEFT ABI 1.1, TBI 0.6 with triphasic flow bilaterally. We will continue his same treatment plan. He has been educated on compression therapy and need for elevation. He will benefit from lymphedema pumps 11/01/17-He is here in follow-up evaluation for left lower extremity ulcer. The right lower extremity remains healed. He has home health services, but they have not been out to see the patient for 2-3 weeks. He states it home health physical therapy changed his dressing yesterday after therapy; he placed Ace wrap compression. We are still waiting for lymphedema pumps, reordered d/t need for company change. 11/08/17-He is here in follow-up evaluation for left lower extremity ulcer. It is improved. Edema is significantly improved with compression therapy. We will continue with same treatment plan and he will follow-up next week. No word regarding lymphedema pumps 11/15/17-He is here in follow-up evaluation for left lower extremity ulcer. He is healed and will be discharged from wound care services. I have reached out to [facility] regarding his lymphedema pumps. They have been unable to reach the patient; the contact number they had with the patient's wife's cell phone and she has not answered any unrecognized calls. Contact should be made today, trial planned for next week; [facility] will  continue to follow 11/27/17 on evaluation today patient has multiple blistered areas over the right lower  extremity his left lower extremity appears to be doing okay. These blistered areas show signs of no infection which is great news. With that being said he did have some necrotic skin overlying which was mechanically debrided away with saline and gauze today without complication. Overall post debridement the wounds appear to be doing better but in general his swelling seems to be increased. This is obviously not good news. I think this is what has given rise to the blisters. 12/04/17 on evaluation today patient presents for follow-up concerning his bilateral lower extremity edema in the right lower extremity ulcers. He has been tolerating the dressing changes without complication. With that being said he has had no real issues with the wraps which is also good news. Overall I'm pleased with the progress he's been making. 12/11/17 on evaluation today patient appears to be doing rather well in regard to his right lateral lower extremity ulcer. He's been tolerating the dressing changes without complication. Fortunately there does not appear to be any evidence of infection at this time. Overall I'm pleased with the progress that is being made. Unfortunately he has been in the hospital due to having what sounds to be a stomach virus/flu fortunately that is starting to get better. 12/18/17 on evaluation today patient actually appears to be doing very well in regard to his bilateral lower extremities the swelling is under fairly good control his lymphedema pumps are still not up and running quite yet. With that being said he does have several areas of opening noted as far as wounds are concerned mainly over the left lower extremity. With that being said I do believe once he gets lymphedema pumps this would least help you mention some the fluid and preventing this from occurring. Hopefully that will be set up  soon sleeves are Artie in place at his home he just waiting for the machine. 12/25/17 on evaluation today patient actually appears to be doing excellent in fact all of his ulcers appear to have resolved his legs appear very well. I do think he needs compression stockings we have discussed this and they are actually going to go to [facility] today to elastic therapy to get this fitted for him. I think that is definitely a good thing to do. Readmission: 04/09/18 upon evaluation today this patient is seen for readmission due to bilateral lower extremity lymphedema. He has significant swelling of his extremities especially on the left although the right is also swollen he has weeping from both sides. There are no obvious open wounds at this point. Fortunately he has been doing fairly well for quite a bit of time since I last saw him. Nonetheless unfortunately this seems to have reopened and is giving quite a bit of trouble. He states this began about a week ago when he first called [place] to get in to be seen. No fevers, chills, nausea, or vomiting noted at this time. He has not been using his lymphedema pumps due to the fact that they won't fit on his leg at this point likewise is also not been using his compression for essentially the same reason. 04/16/18 upon evaluation today patient actually appears to be doing a little better in regard to the fluid in his bilateral lower extremities. With that being said he's had three falls since I saw him last week. He also states that he's been feeling very poorly. I was concerned last week and feel like that the concern is still there as far as the congestion in  his chest is concerned he seems to be breathing about the same as last week but again he states he's very weak he's not even able to walk further than from the chair to the door. His wife had to buy a wheelchair just to be able to get them out of the house to get to the appointment today. This has me very  concerned. 04/23/18 on evaluation today patient actually appears to be doing much better than last week's evaluation. At that time actually had to transport him to the ER via EMS and he subsequently was admitted for acute pulmonary edema, acute renal failure, and acute congestive heart failure. Fortunately he is doing much better. Apparently they did dialyze him and were able to take off roughly 35 pounds of fluid. Nonetheless he is feeling much better both in regard to his breathing and he's able to get around much better at this time compared to previous. Overall I'm very KLARK, VANDERHOEF ([id number]) happy with how things are at this time. There does not appear to be any evidence of infection currently. No fevers, chills, nausea, or vomiting noted at this time. 04/30/2018 patient seen today for follow-up and management of bilateral lower extremity lymphedema. He did express being more sad today than usual due to the recent loss of his dog. He states that he has been compliant with using the lymphedema pumps. However he does admit a minute over the last 2-3 days he has not been using the pumps due to the recent loss of his dog. At this time there is no drainage or open wounds to his lower extremities. The left leg edema is measuring smaller today. Still has a significant amount of edema on bilateral lower extremities With dry flaky skin. He will be referred to the lymphedema clinic for further management. Will continue 3 layer compression wraps and follow-up in 1-2 weeks.Denies any pain, fever, chairs recently. No recent falls or injuries reported during this visit. 05/07/18 on evaluation today patient actually appears to be doing very well in regard to his lower extremities in general all things considered. With that being said he is having some pain in the legs just due to the amount of swelling. He does have an area where he had a blister on the left lateral lower extremity this is open at this  point other than that there's nothing else weeping at this time. 05/14/18 on evaluation today patient actually appears to be doing excellent all things considered in regard to his lower extremities. He still has a couple areas of weeping on each leg which has continued to be the issue for him. He does have an appointment with the lymphedema clinic although this isn't until February 2020. That was the earliest they had. In the meantime he has continued to tolerate the compression wraps without complication. 05/28/18 on evaluation today patient actually appears to be doing more poorly in regard to his left lower extremity where he has a wound open at this point. He also had a fall where he subsequently injured his right great toe which has led to an open wound at the site unfortunately. He has been tolerating the dressing changes without complication in general as far as the wraps are concerned that he has not been putting any dressing on the left 1st toe ulcer site. 06/11/18 on evaluation today patient appears to be doing much worse in regard to his bilateral lower extremity ulcers. He has been tolerating the dressing changes without complication although  his legs have not been wrapped more recently. Overall I am not very pleased with the way his legs appear. I do believe he needs to be back in compression wraps he still has not received his compression wraps from the [facility] hospital as of yet. 06/18/18 on evaluation today patient actually appears to be doing significantly better than last time I saw him. He has been tolerating the compression wraps without complication in the circumferential ulcers especially appear to be doing much better. His toe ulcer on the right in regard to the great toe is better although not as good as the legs in my pinion. No fevers chills noted 07/02/18 on evaluation today patient appears to be doing much better in regard to his lower extremity ulcers. Unfortunately since I last  saw him he's had the distal portion of his right great toe if he dated it sounds as if this actually went downhill very quickly. I had only seen him a few days prior and the toe did not appear to be infected at that point subsequently became infected very rapidly and it was decided by the surgeon that the distal portion of the toe needed to be removed. The patient seems to be doing well in this regard he tells me. With that being said his lower extremities are doing better from the standpoint of the wounds although he is significantly swollen at this point. 07/09/18 on evaluation today patient appears to be doing better in regard to the wounds on his lower extremities. In fact everything is almost completely healed he is just a small area on the left posterior lower extremity that is open at this point. 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 With that being said he needs some kind of compression it does sound like he may not be wearing his compression, that is the wraps, during the entire time between when he's here visit to visit. Apparently his wife took the current one off because it began to "fall apart". 07/16/18 on evaluation today patient appears to be extremely swollen especially in regard to his left lower extremity unfortunately. He also has a new skin tear over the left lower extremity and there's a smaller area on the right lower extremity as well. Unfortunately this seems to be due in part to blistering and fluid buildup in his leg. He did get the reduction wraps that were ordered by the [facility] hospital for him to go to lymphedema clinic. With that being said his wounds on the legs have not healed to the point to where they would likely accept them as a patient lymphedema clinic currently. We need to try to  get this to heal. With that being said he's been taking his wraps off which is not doing him any favors at this point. In fact this is probably quite counterproductive compared to what needs to occur. We will likely need to increase to a four layer compression wrap and continue to also utilize elevation and he has to keep the wraps on not take them off as he's been doing currently hasn't had a wrap on since Saturday. 07/23/18 on evaluation today patient appears to be doing much better in regard to his bilateral lower extremities. In fact his left lower extremity which was the largest is actually 15 cm smaller today compared to  what it was last time he was here in our clinic. This is obviously good news after just one week. Nonetheless the differences he actually kept the wraps on during the entire week this time. That's not typical for him. I do believe he understands a little bit better now the severity of the situation and why it's important for him to keep these wraps on. 07/30/18 on evaluation today patient actually appears to be doing rather well in regard to his lower extremities. His legs are much smaller than they have been in the past and he actually has only one very small rudder superficial region remaining that is not closed on the left lateral/posterior lower extremity even this is almost completely close. I do believe likely next week he will be healed without any complications. I do think we need to continue the wraps however this seems to be beneficial for him. I also think it may be a good time for [place] to go ahead and see about getting the appointment with the lymphedema clinic which is supposed to be made for him in order to keep this moving along and hopefully get them into compression wraps that will in the end help him to remain healed. 08/06/18 on evaluation today patient actually appears to be doing very well in regard as bilateral lower extremities. In fact his wounds appear to  be completely healed at this time. 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 He is in the process of getting appointment with the lymphedema clinic we have made this referral were just waiting to hear back on the schedule time. We need to follow up on that today as well. 08/13/18 on evaluation today patient actually appears to be doing very well in regard to his bilateral lower extremities there are no open wounds at this point. We are gonna go ahead and see about ordering the Velcro compression wraps for him had a discussion with them about [place] doing it versus the [place] they feel like they can definitely afford going ahead and get the wraps themselves and they would prefer to try to avoid having to go to the lymphedema clinic if it all possible which I completely understand. As long as he has good compression I'm okay either way. 3/17//20 on evaluation today patient actually appears to be doing well in regard to his bilateral lower extremity ulcers. He has been tolerating the dressing changes without complication specifically the compression wraps. Overall is had no issues my fingers finding that I see at this point is that he is having trouble with constipation. He tells me he has not been able to go to the bathroom for about six days. He's taken to over-the- counter oral laxatives unfortunately this is not helping. He has not contacted his doctor. OWAIS, PRUETT ([id number]) 08/27/18 on evaluation today patient appears to be doing fairly well in regard to his lower extremities at this point. There does not appear to be any new altars is swelling is very well controlled. We are still waiting for his Velcro compression wraps to arrive that should be sometime in the next week is Artie sent the check for these. 09/03/18 on evaluation today patient appears to be doing excellent in regard to his bilateral lower extremities which he shows no signs of  wound openings in regard to this point. He does have his Velcro compression wraps which did arrive in the mail since I saw him last week. Overall  he is doing excellent in my pinion. 09/13/18 on evaluation today patient appears to be doing very well currently in regard to the overall appearance of his bilateral lower extremities although he's a little bit more swollen than last time we saw him. At that point he been discharged without any open wounds. Nonetheless he has a small open wound on the posterior left lower extremity with some evidence of cellulitis noted as well. Fortunately I feel like he has made good progress overall with regard to his lower extremities from were things used to be. 09/20/18 on evaluation today patient actually appears to be doing much better. The erythematous lower extremity is improving wound itself which is still open appears to be doing much better as far as but appearance as well as pain is concerned overall very pleased in this regard. There's no signs of active infection at this time. 09/27/18 on evaluation today patient's wounds on the lower extremity actually appear to be doing fairly well at this time which is good news. There is no evidence of active infection currently and again is just as left lower extremity were there any wounds at this point anyway. I believe they may be completely healed but again I'm not 100% sure based on evaluation today. I think one more week of observation would likely be a good idea. 10/04/18 on evaluation today patient actually appears to be doing excellent in regard to the left lower extremity which actually appears to be completely healed as of today. Unfortunately he's been having issues with his right lower extremity have a new wound that has opened. Fortunately there's no evidence of active infection at this time which is good news. 10/10/18 upon evaluation today patient actually appears to be doing a little bit worse with the new  open area on his right posterior lower extremity. He's been tolerating the dressing changes without complication. Right now we been using his compression wraps although I think we may need to switch back to actually performing bilateral compression wraps are in the clinic. No fevers, chills, nausea, or vomiting noted at this time. 10/17/18 on evaluation today patient actually appears to be doing quite well in regard to his bilateral lower extremity ulcers. He has been tolerating the reps without complication although he would prefer not to rewrap his legs as of today. Fortunately there's no signs of active infection which is good news. No fevers, chills, nausea, or vomiting noted at this time. 10/24/18 on evaluation today patient appears to be doing very well in regard to his lower extremities. His right lower extremity is shown signs of healing and his left lower Trinity though not healed appears to be improving which is excellent news. Overall very pleased with how things seem to be progressing at this time. The patient is likewise happy to hear this. His Velcro compression wraps however have not been put on properly will gonna show his wife how to do that properly today. 10/31/18 on evaluation today patient appears to be doing more poorly in regard to his left lower extremity in particular although both lower extremities actually are showing some signs of being worse than my previous evaluation. Unfortunately I'm just not sure that his compression stockings even with the use of the compression/lymphedema pumps seem to be controlling this well. Upon further questioning he tells me that he also is not able to lie flat in the bed due to his congestive heart failure and difficulty breathing. For that reason he sleeps in his recliner.  To make matters worse his recliner also cannot even hold his legs up so instead of even being somewhat elevated they pretty much hang down to the floor. This is the way he  sleeps each night which is definitely counterproductive to everything else that were attempting to do from the standpoint of controlling his fluid. Nonetheless I think that he potentially could benefit from a hospital bed although this would be something that his primary care provider would likely have to order since anything that is order on our side has to be directly related to wound care and again the hospital bed is not necessarily a direct relation to although I think it does contribute to his overall wound status on his lower extremities. 11/07/18 on evaluation today patient appears to be doing better in regard to his bilateral lower extremity. He's been tolerating the dressing changes without complication. We did in the interim since I last saw him switch to just using extras orbiting new alginate and that seems to have done much better for him. I'm very pleased with the overall progress that is made. 11/14/18 on evaluation today patient appears to be redoing rather well in regard to his left lower extremity ulcers which are the only ones remaining at this point. Fortunately there's no signs of active infection at this time which is good news. Overall been very pleased with how things seem to be progressing currently. No fevers, chills, nausea, or vomiting noted at this time. 11/20/17 on evaluation today patient actually appears to be doing quite well in regard to his lower extremities on the left on the right he has several blisters that showed up although there's some question about whether or not he has had his broker compression wraps on like he was supposed to or not. Fortunately there's no signs of active infection at this time which is good news. Unfortunately though he is doing well from the standpoint of the left leg the right leg is not doing as well again this is when we did not wrap last week. 11/28/18 on evaluation today patient appears to be doing well in regard to his bilateral lower  extremities is left appears to be healed is right is not healed but is very close to being so. Overall very pleased with how things seem to be progressing. Patient is likewise happy that things are doing well. 12/09/18 on evaluation today patient actually appears to be completely healed which is excellent news. He actually seems to be doing well in regard to the swelling of the bilateral lower extremities which is also great news. Overall very pleased with how things seem to be progressing. Readmission: 03/18/2019 upon evaluation today patient presents for reevaluation concerning issues that he is having with his left lower extremity where he does have a wound noted upon inspection today. He also has a wound on the right second toe on the tip where it is apparently been rubbing in his shoe I did look at issues and you can actually see where this has been occurring as well. Fortunately there is no signs of infection with regard to the toe necessarily although it is very swollen compared to normal that does have me somewhat concerned about the possibility of further evaluation for infection/osteomyelitis. I would recommend an x-ray to start with. Otherwise he states that it is been 1-2 weeks that he has had the draining in regard to left lower extremity he does not know how this happened he has been wearing his compression  appropriately and I do see that as well today but nonetheless I do think that he needs to continue to be very cautious with regard to elevation as well. SCOTLAND, KORVER ([id number]) 03/25/2019 on evaluation today patient appears to be doing much better in regard to his lower extremity at this time. That is on the left. He did culture positive for Pseudomonas but the good news is he seems to be doing much better the gentamicin we applied topically seems to have done a great job for him. There is no signs of active infection at this time systemically and locally he is doing much better.  No fevers, chills, nausea, vomiting, or diarrhea. In regard to his right toe this seems to be doing much better there is very little pressure noted at this point I did clean up some of the callus today around the edges of the wound as well as the surface of the wound but to be honest he is progressing quite nicely. 10/30; the area on the left anterior lower tibia area is healed over. His edema control is adequate even though his use of the compression pumps seems very intermittent. He has a juxta lite stocking on the right leg and a think there is 1 for the left leg at home. His wife is putting these on. He has an area on the plantar right second toe which is a hammertoe they are trying to offload this 04/08/2019 on evaluation today patient appears to be doing well in regard to his lower extremities which is good news. We are seeing him today for his toe ulcer which is giving him a little bit more trouble but again still seems to be doing better at this point which is good news. He is going require some sharp debridement in regard to the toe today however. 04/15/2019 on evaluation today patient actually appears to be doing quite well with regard to his toe ulcer. I am very pleased with how things are doing in that regard. With regard to his lower extremity edema in general he tells me he is not been using the lymphedema pumps regularly although he does not use them. I think that he needs to use them more regularly he does have a small blister on the left lower extremity this is not open at this point but obviously it something that can get worse if he does not keep the compression going and the pumps going as well. 04/22/2019 on evaluation today patient appears to be doing well with regard to his toe ulcer. He has been tolerating the dressing changes without complication. This is measuring slightly smaller this week as compared to last week. Fortunately there is no signs of active infection at this  time. 05/06/2019 on evaluation today patient actually appears to be doing excellent. In fact his toe appears to be completely healed which is great news. There is no signs of active infection at this time. Readmission: Patient presents today for follow-up after having been discharged at the beginning of December 2020. He states that in recent weeks things have reopened and has been having more trouble at this point. With that being said fortunately there is no signs of active infection at this time. No fever chills noted. Nonetheless he also does have an issue with one of his toes as well that seems to be somewhat this is on the right foot second toe. 07/25/2019 upon evaluation today patient's lower extremities appear to be doing excellent bilaterally. I really feel  like he is showing signs of great improvement and in fact I think he is close to healing which is great news. There is no signs of active infection at this time. No fevers, chills, nausea, vomiting, or diarrhea. 07/31/2019 upon evaluation today patient appears to be doing excellent and in fact I think may be completely healed in regard to his right lower extremity that were still to monitor this 1 more week before closing it out. The left lower extremity is measuring smaller although not completely closed seems to be doing excellent. 08/07/2019 upon evaluation today patient appears to be doing excellent in regard to his wounds. In fact the right lower extremity is completely healed there is no issues here. The left lower extremity has 1 very small area still remaining fortunately there is no signs of infection and overall I think he is very close to closure of the here as well. 08/14/19 upon evaluation today patient appears to be doing excellent in regard to his lower extremities. In fact he appears to be completely healed as of today. Fortunately there's no signs of active infection at this time. No fevers, chills, nausea, or vomiting noted  at this time. READMISSION 09/26/2019 This is a 74 year old man who is well-known to this clinic having just been discharged on 08/14/2019. He has known lymphedema and chronic venous insufficiency. He has Farrow wrap stockings and also external compression pumps. He does not use the external compression pumps however. He does however apparently fairly faithfully use the stockings. They have not been lotion in his legs. He has developed new wounds on the right anterior as well as the left medial left lateral and left posterior calf. He returns for our review of this Past medical history includes coronary artery disease, hypertension, congestive heart failure, COPD, venous insufficiency with lymphedema, type 2 diabetes. He apparently has compression pumps, Farrow wraps and at 1 point a hospital bed although I believe he sleeps in a recliner 10/03/2019 upon evaluation today patient appears to be doing better with regard to his wounds in general. He has been tolerating the dressing changes without complication. Fortunately there is no signs of active infection. No fevers, chills, nausea, vomiting, or diarrhea. I do believe the compression wraps are helping as they always have in the past. 10/10/2019 upon evaluation today patient appears to be doing excellent at this point. His right lower extremity is measuring much better and in fact is completely healed. His left lower extremity is also measuring better though not healed seems to be doing excellent at this point which is great news. Overall I am very pleased with how things appear currently. 10/27/2019 upon evaluation today patient appears to be doing quite well with regard to his wounds currently. He has been tolerating the dressing changes without complication. Fortunately there is no signs of active infection at this time. In fact he is almost completely healed on the left and has just a very small area open and on the right he is completely  healed. 11/04/2019 upon evaluation today patient appears to be doing quite well with regard to his wounds. In fact he appears to be quite possibly completely healed on the left lower extremity now as well the right lower extremity is still doing well. With that being said unfortunately though this may be healed I think it is a very fragile area and I cannot even confirm there is not a very small opening still remaining. I really think he may benefit from 1 additional weeks  wrapping before discontinuing. 11/10/2019 upon evaluation today patient appears to be doing well in regard to his original wounds in fact everything that we were treating last week is completely healed on the left. Unfortunately he has a new skin tear just above where the wrap slipped down due to a fall he sustained causing this injury. 11/17/2019 on evaluation today patient appears to be doing better with regard to his wound. He has been tolerating the dressing changes without YERAY, TOMAS. ([id number]) complication. Fortunately there is no signs of active infection at this time. No fevers, chills, nausea, vomiting, or diarrhea. 11/24/2019 upon evaluation today patient appears to be doing well with regard to his wound. This is making good progress there does not appear to be signs of active infection but overall I do feel like he is headed in the correct direction. Overall he is tolerating the compression wrap as well. 12/02/2019 upon evaluation today patient appears to be doing well with regard to his leg ulcer. In fact this appears to be completely healed and this is the last of the wounds that he had but still the left anterior lower extremity. Fortunately there is no signs of active infection at this time. No fevers, chills, nausea, vomiting, or diarrhea. Readmission: 02/05/2020 on evaluation today patient appears to be doing well with regard to his wound all things considered. He actually tells me that a couple weeks ago he had  trouble with his wraps and therefore took him off for a couple of days in order to give his legs a break. It was during this time that he actually developed increased swelling and edema of his legs and blisters on both legs. That is when he called to make the appointment. Nonetheless since that time he began wearing his compression wraps which are Velcro in the meantime and has done extremely well with this. In fact his leg appears to be under good control as far as edema is concerned today it is nothing like what I am seeing when he has come in for evaluations in the past. They have been using some of the silver alginate at home which has been beneficial for him. Fortunately there is no signs of active infection at this time. No fevers, chills, nausea, vomiting, or diarrhea. 02/19/2020 on evaluation today patient unfortunately does not appear to be doing quite as well as I would like to see. He has no signs of active infection at this time but unfortunately he is not doing well in regard to the overall appearance of his left leg. He has a couple other areas that are weeping now and the leg is much more swollen. I think that he needs to actually have a compression wrap to try to manage this. 02/26/2020 on evaluation today patient appears to be doing well with regard to his left lower extremity ulcer. Fortunately the swelling is down I do believe the pressure wrap seems to be doing quite well. Fortunately there is no signs of active infection at this time. 03/05/2020 upon evaluation today patient appears to be doing excellent in regard to his legs at this point. Fortunately there is no signs of active infection which is great news. Overall he feels like he is completely healed which is great news. Electronic Signature(s) Signed: 03/05/2020 5:26:08 PM By: Worthy Keeler PA-C Entered By: Worthy Keeler on 03/05/2020 17:26:08 Adam Merritt  ([id number]) -------------------------------------------------------------------------------- Physical Exam Details Patient Name: Adam Merritt Date of Service: 03/05/2020 2:15 PM Medical  Record Number: [id number] Patient Account Number: [id number] Date of Birth/Sex: 02/22/1946 (74 y.o. M) Treating RN: Grover Canavan Primary Care Provider: [name] Other Clinician: Referring Provider: [name] Treating Provider/Extender: Melburn Hake, Vong Garringer [name]: 4 Constitutional Obese and well-hydrated in no acute distress. Respiratory normal breathing without difficulty. Psychiatric this patient is able to make decisions and demonstrates good insight into disease process. Alert and Oriented x 3. pleasant and cooperative. Notes Patient's wounds again showed signs of complete epithelization overall he seems to be doing excellent. He does have his Velcro compression wraps currently. Electronic Signature(s) Signed: 03/05/2020 5:26:36 PM By: Worthy Keeler PA-C Entered By: Worthy Keeler on 03/05/2020 17:26:36 Adam Merritt ([id number]) -------------------------------------------------------------------------------- Physician Orders Details Patient Name: Adam Merritt Date of Service: 03/05/2020 2:15 PM Medical Record Number: [id number] Patient Account Number: [id number] Date of Birth/Sex: [birth year] (74 y.o. M) Treating RN: Grover Canavan Primary Care Provider: [name] Other Clinician: Referring Provider: [name] Treating Provider/Extender: Melburn Hake, Keenen Roessner [name]: 4 Verbal / Phone Orders: No Diagnosis Coding ICD-10 Coding Code Description I89.0 Lymphedema, not elsewhere classified I87.2 Venous insufficiency (chronic) (peripheral) L97.822 Non-pressure chronic ulcer of other part of left lower leg with fat layer exposed Discharge From [place] o Discharge from [place] - treatment completed. Notes wear velcro wraps on both legs  daily Electronic Signature(s) Signed: 03/05/2020 4:08:44 PM By: Grover Canavan Signed: 03/05/2020 6:04:02 PM By: Worthy Keeler PA-C Entered By: Grover Canavan on 03/05/2020 14:44:52 Adam Merritt ([id number]) -------------------------------------------------------------------------------- Problem List Details Patient Name: Adam Merritt Date of Service: 03/05/2020 2:15 PM Medical Record Number: [id number] Patient Account Number: [id number] Date of Birth/Sex: 03/22/1946 (74 y.o. M) Treating RN: Grover Canavan Primary Care Provider: [name] Other Clinician: Referring Provider: [name] Treating Provider/Extender: Melburn Hake, Daleysa Kristiansen [name]: 4 Active Problems ICD-10 Encounter Code Description Active Date MDM Diagnosis I89.0 Lymphedema, not elsewhere classified 02/05/2020 No Yes I87.2 Venous insufficiency (chronic) (peripheral) 02/05/2020 No Yes L97.822 Non-pressure chronic ulcer of other part of left lower leg with fat layer 02/05/2020 No Yes exposed Inactive Problems Resolved Problems Electronic Signature(s) Signed: 03/05/2020 2:36:54 PM By: Worthy Keeler PA-C Entered By: Worthy Keeler on 03/05/2020 14:36:53 Adam Merritt ([id number]) -------------------------------------------------------------------------------- Progress Note Details Patient Name: Adam Merritt Date of Service: 03/05/2020 2:15 PM Medical Record Number: [id number] Patient Account Number: [id number] Date of Birth/Sex: [birth year] (74 y.o. M) Treating RN: Grover Canavan Primary Care Provider: [name] Other Clinician: Referring Provider: [name] Treating Provider/Extender: Melburn Hake, Jong Rickman [name]: 4 Subjective Chief Complaint Information obtained from Patient Left leg ulcers History of Present Illness (HPI) 10/18/17-He is here for initial evaluation of bilateral lower extremity ulcerations in the presence of venous insufficiency and lymphedema. He has been seen by  vascular medicine in the past, Dr. Lucky Cowboy, last seen in 2016. He does have a history of abnormal ABIs, which is to be expected given his lymphedema and venous insufficiency. According to Epic, it appears that all attempts for arterial evaluation and/or angiography were not follow through with by patient. He does have a history of being seen in lymphedema clinic in 2018, stopped going approximately 6 months ago stating "it didn't do any good". He does not have lymphedema pumps, he does not have custom fit compression wrap/stockings. He is diabetic and his recent A1c last month was 7.6. He admits to chronic bilateral lower extremity pain, no change in pain since blister and ulceration development. He is  currently being treated with Levaquin for bronchitis. He has home health and we will continue. 10/25/17-He is here in follow-up evaluation for bilateral lower extremity ulcerationssubtle he remains on Levaquin for bronchitis. Right lower extremity with no evidence of drainage or ulceration, persistent left lower extremity ulceration. He states that home health has not been out since his appointment. He went to [facility] on Tuesday, studies revealed: RIGHT ABI 0.9, TBI 0.6 LEFT ABI 1.1, TBI 0.6 with triphasic flow bilaterally. We will continue his same treatment plan. He has been educated on compression therapy and need for elevation. He will benefit from lymphedema pumps 11/01/17-He is here in follow-up evaluation for left lower extremity ulcer. The right lower extremity remains healed. He has home health services, but they have not been out to see the patient for 2-3 weeks. He states it home health physical therapy changed his dressing yesterday after therapy; he placed Ace wrap compression. We are still waiting for lymphedema pumps, reordered d/t need for company change. 11/08/17-He is here in follow-up evaluation for left lower extremity ulcer. It is improved. Edema is significantly improved  with compression therapy. We will continue with same treatment plan and he will follow-up next week. No word regarding lymphedema pumps 11/15/17-He is here in follow-up evaluation for left lower extremity ulcer. He is healed and will be discharged from wound care services. I have reached out to [facility] regarding his lymphedema pumps. They have been unable to reach the patient; the contact number they had with the patient's wife's cell phone and she has not answered any unrecognized calls. Contact should be made today, trial planned for next week; [facility] will continue to follow 11/27/17 on evaluation today patient has multiple blistered areas over the right lower extremity his left lower extremity appears to be doing okay. These blistered areas show signs of no infection which is great news. With that being said he did have some necrotic skin overlying which was mechanically debrided away with saline and gauze today without complication. Overall post debridement the wounds appear to be doing better but in general his swelling seems to be increased. This is obviously not good news. I think this is what has given rise to the blisters. 12/04/17 on evaluation today patient presents for follow-up concerning his bilateral lower extremity edema in the right lower extremity ulcers. He has been tolerating the dressing changes without complication. With that being said he has had no real issues with the wraps which is also good news. Overall I'm pleased with the progress he's been making. 12/11/17 on evaluation today patient appears to be doing rather well in regard to his right lateral lower extremity ulcer. He's been tolerating the dressing changes without complication. Fortunately there does not appear to be any evidence of infection at this time. Overall I'm pleased with the progress that is being made. Unfortunately he has been in the hospital due to having what sounds to be a stomach  virus/flu fortunately that is starting to get better. 12/18/17 on evaluation today patient actually appears to be doing very well in regard to his bilateral lower extremities the swelling is under fairly good control his lymphedema pumps are still not up and running quite yet. With that being said he does have several areas of opening noted as far as wounds are concerned mainly over the left lower extremity. With that being said I do believe once he gets lymphedema pumps this would least help you mention some the fluid and  preventing this from occurring. Hopefully that will be set up soon sleeves are Artie in place at his home he just waiting for the machine. 12/25/17 on evaluation today patient actually appears to be doing excellent in fact all of his ulcers appear to have resolved his legs appear very well. I do think he needs compression stockings we have discussed this and they are actually going to go to [place] today to elastic therapy to get this fitted for him. I think that is definitely a good thing to do. Readmission: 04/09/18 upon evaluation today this patient is seen for readmission due to bilateral lower extremity lymphedema. He has significant swelling of his extremities especially on the left although the right is also swollen he has weeping from both sides. There are no obvious open wounds at this point. Fortunately he has been doing fairly well for quite a bit of time since I last saw him. Nonetheless unfortunately this seems to have reopened and is giving quite a bit of trouble. He states this began about a week ago when he first called [place] to get in to be seen. No fevers, chills, nausea, or vomiting noted at this time. He has not been using his lymphedema pumps due to the fact that they won't fit on his leg at this point likewise is also not been using his compression for essentially the same reason. 04/16/18 upon evaluation today patient actually appears to be doing a little better  in regard to the fluid in his bilateral lower extremities. With that being said he's had three falls since I saw him last week. He also states that he's been feeling very poorly. I was concerned last week and feel like that the concern is still there as far as the congestion in his chest is concerned he seems to be breathing about the same as last week but again he states he's very weak he's not even able to walk further than from the chair to the door. His wife had to buy a wheelchair just to be able to get them out of the house to get to the appointment today. This has me very concerned. STEPHAUN, MILLION ([id number]) 04/23/18 on evaluation today patient actually appears to be doing much better than last week's evaluation. At that time actually had to transport him to the ER via EMS and he subsequently was admitted for acute pulmonary edema, acute renal failure, and acute congestive heart failure. Fortunately he is doing much better. Apparently they did dialyze him and were able to take off roughly 35 pounds of fluid. Nonetheless he is feeling much better both in regard to his breathing and he's able to get around much better at this time compared to previous. Overall I'm very happy with how things are at this time. There does not appear to be any evidence of infection currently. No fevers, chills, nausea, or vomiting noted at this time. 04/30/2018 patient seen today for follow-up and management of bilateral lower extremity lymphedema. He did express being more sad today than usual due to the recent loss of his dog. He states that he has been compliant with using the lymphedema pumps. However he does admit a minute over the last 2-3 days he has not been using the pumps due to the recent loss of his dog. At this time there is no drainage or open wounds to his lower extremities. The left leg edema is measuring smaller today. Still has a significant amount of edema on bilateral lower extremities  With dry  flaky skin. He will be referred to the lymphedema clinic for further management. Will continue 3 layer compression wraps and follow-up in 1-2 weeks.Denies any pain, fever, chairs recently. No recent falls or injuries reported during this visit. 05/07/18 on evaluation today patient actually appears to be doing very well in regard to his lower extremities in general all things considered. With that being said he is having some pain in the legs just due to the amount of swelling. He does have an area where he had a blister on the left lateral lower extremity this is open at this point other than that there's nothing else weeping at this time. 05/14/18 on evaluation today patient actually appears to be doing excellent all things considered in regard to his lower extremities. He still has a couple areas of weeping on each leg which has continued to be the issue for him. He does have an appointment with the lymphedema clinic although this isn't until February 2020. That was the earliest they had. In the meantime he has continued to tolerate the compression wraps without complication. 05/28/18 on evaluation today patient actually appears to be doing more poorly in regard to his left lower extremity where he has a wound open at this point. He also had a fall where he subsequently injured his right great toe which has led to an open wound at the site unfortunately. He has been tolerating the dressing changes without complication in general as far as the wraps are concerned that he has not been putting any dressing on the left 1st toe ulcer site. 06/11/18 on evaluation today patient appears to be doing much worse in regard to his bilateral lower extremity ulcers. He has been tolerating the dressing changes without complication although his legs have not been wrapped more recently. Overall I am not very pleased with the way his legs appear. I do believe he needs to be back in compression wraps he still has not  received his compression wraps from the [facility] hospital as of yet. 06/18/18 on evaluation today patient actually appears to be doing significantly better than last time I saw him. He has been tolerating the compression wraps without complication in the circumferential ulcers especially appear to be doing much better. His toe ulcer on the right in regard to the great toe is better although not as good as the legs in my pinion. No fevers chills noted 07/02/18 on evaluation today patient appears to be doing much better in regard to his lower extremity ulcers. Unfortunately since I last saw him he's had the distal portion of his right great toe if he dated it sounds as if this actually went downhill very quickly. I had only seen him a few days prior and the toe did not appear to be infected at that point subsequently became infected very rapidly and it was decided by the surgeon that the distal portion of the toe needed to be removed. The patient seems to be doing well in this regard he tells me. With that being said his lower extremities are doing better from the standpoint of the wounds although he is significantly swollen at this point. 07/09/18 on evaluation today patient appears to be doing better in regard to the wounds on his lower extremities. In fact everything is almost completely healed he is just a small area on the left posterior lower extremity that is open at this point. 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 With that being said he needs some kind of compression it does sound like he may not be wearing his compression, that is the wraps, during the entire time between when he's here visit to visit. Apparently his wife took the current one off because it began to "fall apart". 07/16/18 on evaluation today patient  appears to be extremely swollen especially in regard to his left lower extremity unfortunately. He also has a new skin tear over the left lower extremity and there's a smaller area on the right lower extremity as well. Unfortunately this seems to be due in part to blistering and fluid buildup in his leg. He did get the reduction wraps that were ordered by the [facility] hospital for him to go to lymphedema clinic. With that being said his wounds on the legs have not healed to the point to where they would likely accept them as a patient lymphedema clinic currently. We need to try to get this to heal. With that being said he's been taking his wraps off which is not doing him any favors at this point. In fact this is probably quite counterproductive compared to what needs to occur. We will likely need to increase to a four layer compression wrap and continue to also utilize elevation and he has to keep the wraps on not take them off as he's been doing currently hasn't had a wrap on since Saturday. 07/23/18 on evaluation today patient appears to be doing much better in regard to his bilateral lower extremities. In fact his left lower extremity which was the largest is actually 15 cm smaller today compared to what it was last time he was here in our clinic. This is obviously good news after just one week. Nonetheless the differences he actually kept the wraps on during the entire week this time. That's not typical for him. I do believe he understands a little bit better now the severity of the situation and why it's important for him to keep these wraps on. 07/30/18 on evaluation today patient actually appears to be doing rather well in regard to his lower extremities. His legs are much smaller than they have been in the past and he actually has only one very small rudder superficial region remaining that is not closed on the left lateral/posterior lower extremity even this is almost completely close. I do believe  likely next week he will be healed without any complications. I do think we need to continue the wraps however this seems to be beneficial for him. I also think it may be a good time for [place] to go ahead and see about getting the appointment with the lymphedema clinic which is supposed to be made for him in order to keep this moving along and hopefully get them into compression wraps that will in the end help him to remain healed. 08/06/18 on evaluation today patient actually appears to be doing very well in regard as bilateral lower extremities. In fact his wounds appear to be completely healed at this time. 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 He is in the process of getting appointment with the lymphedema clinic we have made this referral were just waiting to hear back on the schedule time. We need to follow up on that today as well. 08/13/18 on evaluation today  patient actually appears to be doing very well in regard to his bilateral lower extremities there are no open wounds at this point. We are gonna go ahead and see about ordering the Velcro compression wraps for him had a discussion with them about [place] doing it versus the [place] they feel like they can definitely afford going ahead and get the wraps themselves and they would prefer to try to avoid having to go to the lymphedema clinic if it all possible which I completely understand. As long as he has good compression I'm okay either way. SIAH, STEELY ([id number]) 3/17//20 on evaluation today patient actually appears to be doing well in regard to his bilateral lower extremity ulcers. He has been tolerating the dressing changes without complication specifically the compression wraps. Overall is had no issues my fingers finding that I see at this point is that he is having trouble with constipation. He tells me he has not been able to go to the bathroom for about six days. He's taken to  over-the- counter oral laxatives unfortunately this is not helping. He has not contacted his doctor. 08/27/18 on evaluation today patient appears to be doing fairly well in regard to his lower extremities at this point. There does not appear to be any new altars is swelling is very well controlled. We are still waiting for his Velcro compression wraps to arrive that should be sometime in the next week is Artie sent the check for these. 09/03/18 on evaluation today patient appears to be doing excellent in regard to his bilateral lower extremities which he shows no signs of wound openings in regard to this point. He does have his Velcro compression wraps which did arrive in the mail since I saw him last week. Overall he is doing excellent in my pinion. 09/13/18 on evaluation today patient appears to be doing very well currently in regard to the overall appearance of his bilateral lower extremities although he's a little bit more swollen than last time we saw him. At that point he been discharged without any open wounds. Nonetheless he has a small open wound on the posterior left lower extremity with some evidence of cellulitis noted as well. Fortunately I feel like he has made good progress overall with regard to his lower extremities from were things used to be. 09/20/18 on evaluation today patient actually appears to be doing much better. The erythematous lower extremity is improving wound itself which is still open appears to be doing much better as far as but appearance as well as pain is concerned overall very pleased in this regard. There's no signs of active infection at this time. 09/27/18 on evaluation today patient's wounds on the lower extremity actually appear to be doing fairly well at this time which is good news. There is no evidence of active infection currently and again is just as left lower extremity were there any wounds at this point anyway. I believe they may be completely healed but  again I'm not 100% sure based on evaluation today. I think one more week of observation would likely be a good idea. 10/04/18 on evaluation today patient actually appears to be doing excellent in regard to the left lower extremity which actually appears to be completely healed as of today. Unfortunately he's been having issues with his right lower extremity have a new wound that has opened. Fortunately there's no evidence of active infection at this time which is good news. 10/10/18 upon evaluation today patient actually appears  to be doing a little bit worse with the new open area on his right posterior lower extremity. He's been tolerating the dressing changes without complication. Right now we been using his compression wraps although I think we may need to switch back to actually performing bilateral compression wraps are in the clinic. No fevers, chills, nausea, or vomiting noted at this time. 10/17/18 on evaluation today patient actually appears to be doing quite well in regard to his bilateral lower extremity ulcers. He has been tolerating the reps without complication although he would prefer not to rewrap his legs as of today. Fortunately there's no signs of active infection which is good news. No fevers, chills, nausea, or vomiting noted at this time. 10/24/18 on evaluation today patient appears to be doing very well in regard to his lower extremities. His right lower extremity is shown signs of healing and his left lower Trinity though not healed appears to be improving which is excellent news. Overall very pleased with how things seem to be progressing at this time. The patient is likewise happy to hear this. His Velcro compression wraps however have not been put on properly will gonna show his wife how to do that properly today. 10/31/18 on evaluation today patient appears to be doing more poorly in regard to his left lower extremity in particular although both lower extremities actually are  showing some signs of being worse than my previous evaluation. Unfortunately I'm just not sure that his compression stockings even with the use of the compression/lymphedema pumps seem to be controlling this well. Upon further questioning he tells me that he also is not able to lie flat in the bed due to his congestive heart failure and difficulty breathing. For that reason he sleeps in his recliner. To make matters worse his recliner also cannot even hold his legs up so instead of even being somewhat elevated they pretty much hang down to the floor. This is the way he sleeps each night which is definitely counterproductive to everything else that were attempting to do from the standpoint of controlling his fluid. Nonetheless I think that he potentially could benefit from a hospital bed although this would be something that his primary care provider would likely have to order since anything that is order on our side has to be directly related to wound care and again the hospital bed is not necessarily a direct relation to although I think it does contribute to his overall wound status on his lower extremities. 11/07/18 on evaluation today patient appears to be doing better in regard to his bilateral lower extremity. He's been tolerating the dressing changes without complication. We did in the interim since I last saw him switch to just using extras orbiting new alginate and that seems to have done much better for him. I'm very pleased with the overall progress that is made. 11/14/18 on evaluation today patient appears to be redoing rather well in regard to his left lower extremity ulcers which are the only ones remaining at this point. Fortunately there's no signs of active infection at this time which is good news. Overall been very pleased with how things seem to be progressing currently. No fevers, chills, nausea, or vomiting noted at this time. 11/20/17 on evaluation today patient actually appears to  be doing quite well in regard to his lower extremities on the left on the right he has several blisters that showed up although there's some question about whether or not he has had his  broker compression wraps on like he was supposed to or not. Fortunately there's no signs of active infection at this time which is good news. Unfortunately though he is doing well from the standpoint of the left leg the right leg is not doing as well again this is when we did not wrap last week. 11/28/18 on evaluation today patient appears to be doing well in regard to his bilateral lower extremities is left appears to be healed is right is not healed but is very close to being so. Overall very pleased with how things seem to be progressing. Patient is likewise happy that things are doing well. 12/09/18 on evaluation today patient actually appears to be completely healed which is excellent news. He actually seems to be doing well in regard to the swelling of the bilateral lower extremities which is also great news. Overall very pleased with how things seem to be progressing. Readmission: 03/18/2019 upon evaluation today patient presents for reevaluation concerning issues that he is having with his left lower extremity where he does have a wound noted upon inspection today. He also has a wound on the right second toe on the tip where it is apparently been rubbing in his shoe I did look at issues and you can actually see where this has been occurring as well. Fortunately there is no signs of infection with regard to DIERRE, CREVIER. ([id number]) the toe necessarily although it is very swollen compared to normal that does have me somewhat concerned about the possibility of further evaluation for infection/osteomyelitis. I would recommend an x-ray to start with. Otherwise he states that it is been 1-2 weeks that he has had the draining in regard to left lower extremity he does not know how this happened he has been wearing his  compression appropriately and I do see that as well today but nonetheless I do think that he needs to continue to be very cautious with regard to elevation as well. 03/25/2019 on evaluation today patient appears to be doing much better in regard to his lower extremity at this time. That is on the left. He did culture positive for Pseudomonas but the good news is he seems to be doing much better the gentamicin we applied topically seems to have done a great job for him. There is no signs of active infection at this time systemically and locally he is doing much better. No fevers, chills, nausea, vomiting, or diarrhea. In regard to his right toe this seems to be doing much better there is very little pressure noted at this point I did clean up some of the callus today around the edges of the wound as well as the surface of the wound but to be honest he is progressing quite nicely. 10/30; the area on the left anterior lower tibia area is healed over. His edema control is adequate even though his use of the compression pumps seems very intermittent. He has a juxta lite stocking on the right leg and a think there is 1 for the left leg at home. His wife is putting these on. He has an area on the plantar right second toe which is a hammertoe they are trying to offload this 04/08/2019 on evaluation today patient appears to be doing well in regard to his lower extremities which is good news. We are seeing him today for his toe ulcer which is giving him a little bit more trouble but again still seems to be doing better at this point which  is good news. He is going require some sharp debridement in regard to the toe today however. 04/15/2019 on evaluation today patient actually appears to be doing quite well with regard to his toe ulcer. I am very pleased with how things are doing in that regard. With regard to his lower extremity edema in general he tells me he is not been using the lymphedema pumps  regularly although he does not use them. I think that he needs to use them more regularly he does have a small blister on the left lower extremity this is not open at this point but obviously it something that can get worse if he does not keep the compression going and the pumps going as well. 04/22/2019 on evaluation today patient appears to be doing well with regard to his toe ulcer. He has been tolerating the dressing changes without complication. This is measuring slightly smaller this week as compared to last week. Fortunately there is no signs of active infection at this time. 05/06/2019 on evaluation today patient actually appears to be doing excellent. In fact his toe appears to be completely healed which is great news. There is no signs of active infection at this time. Readmission: Patient presents today for follow-up after having been discharged at the beginning of December 2020. He states that in recent weeks things have reopened and has been having more trouble at this point. With that being said fortunately there is no signs of active infection at this time. No fever chills noted. Nonetheless he also does have an issue with one of his toes as well that seems to be somewhat this is on the right foot second toe. 07/25/2019 upon evaluation today patient's lower extremities appear to be doing excellent bilaterally. I really feel like he is showing signs of great improvement and in fact I think he is close to healing which is great news. There is no signs of active infection at this time. No fevers, chills, nausea, vomiting, or diarrhea. 07/31/2019 upon evaluation today patient appears to be doing excellent and in fact I think may be completely healed in regard to his right lower extremity that were still to monitor this 1 more week before closing it out. The left lower extremity is measuring smaller although not completely closed seems to be doing excellent. 08/07/2019 upon evaluation today  patient appears to be doing excellent in regard to his wounds. In fact the right lower extremity is completely healed there is no issues here. The left lower extremity has 1 very small area still remaining fortunately there is no signs of infection and overall I think he is very close to closure of the here as well. 08/14/19 upon evaluation today patient appears to be doing excellent in regard to his lower extremities. In fact he appears to be completely healed as of today. Fortunately there's no signs of active infection at this time. No fevers, chills, nausea, or vomiting noted at this time. READMISSION 09/26/2019 This is a 74 year old man who is well-known to this clinic having just been discharged on 08/14/2019. He has known lymphedema and chronic venous insufficiency. He has Farrow wrap stockings and also external compression pumps. He does not use the external compression pumps however. He does however apparently fairly faithfully use the stockings. They have not been lotion in his legs. He has developed new wounds on the right anterior as well as the left medial left lateral and left posterior calf. He returns for our review of this Past medical  history includes coronary artery disease, hypertension, congestive heart failure, COPD, venous insufficiency with lymphedema, type 2 diabetes. He apparently has compression pumps, Farrow wraps and at 1 point a hospital bed although I believe he sleeps in a recliner 10/03/2019 upon evaluation today patient appears to be doing better with regard to his wounds in general. He has been tolerating the dressing changes without complication. Fortunately there is no signs of active infection. No fevers, chills, nausea, vomiting, or diarrhea. I do believe the compression wraps are helping as they always have in the past. 10/10/2019 upon evaluation today patient appears to be doing excellent at this point. His right lower extremity is measuring much better and in  fact is completely healed. His left lower extremity is also measuring better though not healed seems to be doing excellent at this point which is great news. Overall I am very pleased with how things appear currently. 10/27/2019 upon evaluation today patient appears to be doing quite well with regard to his wounds currently. He has been tolerating the dressing changes without complication. Fortunately there is no signs of active infection at this time. In fact he is almost completely healed on the left and has just a very small area open and on the right he is completely healed. 11/04/2019 upon evaluation today patient appears to be doing quite well with regard to his wounds. In fact he appears to be quite possibly completely healed on the left lower extremity now as well the right lower extremity is still doing well. With that being said unfortunately though this may be healed I think it is a very fragile area and I cannot even confirm there is not a very small opening still remaining. I really think he may benefit from 1 additional weeks wrapping before discontinuing. DRAYKE, GRABEL ([id number]) 11/10/2019 upon evaluation today patient appears to be doing well in regard to his original wounds in fact everything that we were treating last week is completely healed on the left. Unfortunately he has a new skin tear just above where the wrap slipped down due to a fall he sustained causing this injury. 11/17/2019 on evaluation today patient appears to be doing better with regard to his wound. He has been tolerating the dressing changes without complication. Fortunately there is no signs of active infection at this time. No fevers, chills, nausea, vomiting, or diarrhea. 11/24/2019 upon evaluation today patient appears to be doing well with regard to his wound. This is making good progress there does not appear to be signs of active infection but overall I do feel like he is headed in the correct direction.  Overall he is tolerating the compression wrap as well. 12/02/2019 upon evaluation today patient appears to be doing well with regard to his leg ulcer. In fact this appears to be completely healed and this is the last of the wounds that he had but still the left anterior lower extremity. Fortunately there is no signs of active infection at this time. No fevers, chills, nausea, vomiting, or diarrhea. Readmission: 02/05/2020 on evaluation today patient appears to be doing well with regard to his wound all things considered. He actually tells me that a couple weeks ago he had trouble with his wraps and therefore took him off for a couple of days in order to give his legs a break. It was during this time that he actually developed increased swelling and edema of his legs and blisters on both legs. That is when he called to make the  appointment. Nonetheless since that time he began wearing his compression wraps which are Velcro in the meantime and has done extremely well with this. In fact his leg appears to be under good control as far as edema is concerned today it is nothing like what I am seeing when he has come in for evaluations in the past. They have been using some of the silver alginate at home which has been beneficial for him. Fortunately there is no signs of active infection at this time. No fevers, chills, nausea, vomiting, or diarrhea. 02/19/2020 on evaluation today patient unfortunately does not appear to be doing quite as well as I would like to see. He has no signs of active infection at this time but unfortunately he is not doing well in regard to the overall appearance of his left leg. He has a couple other areas that are weeping now and the leg is much more swollen. I think that he needs to actually have a compression wrap to try to manage this. 02/26/2020 on evaluation today patient appears to be doing well with regard to his left lower extremity ulcer. Fortunately the swelling is down I  do believe the pressure wrap seems to be doing quite well. Fortunately there is no signs of active infection at this time. 03/05/2020 upon evaluation today patient appears to be doing excellent in regard to his legs at this point. Fortunately there is no signs of active infection which is great news. Overall he feels like he is completely healed which is great news. Objective Constitutional Obese and well-hydrated in no acute distress. Vitals Time Taken: 2:15 PM, Height: 65 in, Weight: 289 lbs, BMI: 48.1, Temperature: 98.1 F, Pulse: 69 bpm, Respiratory Rate: 18 breaths/min, Blood Pressure: 150/90 mmHg. Respiratory normal breathing without difficulty. Psychiatric this patient is able to make decisions and demonstrates good insight into disease process. Alert and Oriented x 3. pleasant and cooperative. General Notes: Patient's wounds again showed signs of complete epithelization overall he seems to be doing excellent. He does have his Velcro compression wraps currently. Integumentary (Hair, Skin) Wound #36 status is Open. Original cause of wound was Gradually Appeared. The wound is located on the Left,Anterior Lower Leg. The wound measures 0cm length x 0cm width x 0cm depth; 0cm^2 area and 0cm^3 volume. There is a none present amount of drainage noted. General Notes: No open areas present on this visit. Assessment Active Problems ICD-10 Lymphedema, not elsewhere classified Venous insufficiency (chronic) (peripheral) Non-pressure chronic ulcer of other part of left lower leg with fat layer exposed GILMAN, OLAZABAL ([id number]) Plan Discharge From [facility] Services: Discharge from [place] - treatment completed. General Notes: wear velcro wraps on both legs daily 1. I would recommend at this time based on what I am seeing that we go ahead and discontinue wound care services as the patient appears to be doing excellent at this point. We will have him transition back into his Velcro  compression wraps. 2. I would recommend the patient needs to continue to elevate his legs much as possible as well try to keep edema under good control. 3. I am also can recommend at this time that the patient continue to monitor for any signs of infection if anything occurs or he is concerned in any way she will know. We will see the patient back for follow-up visit as needed. Electronic Signature(s) Signed: 03/05/2020 5:28:06 PM By: Worthy Keeler PA-C Entered By: Worthy Keeler on 03/05/2020 17:28:06 Adam Merritt ([id number]) --------------------------------------------------------------------------------  SuperBill Details Patient Name: WISDOM, RICKEY Date of Service: 03/05/2020 Medical Record Number: [id number] Patient Account Number: [id number] Date of Birth/Sex: May 19, 1946 (74 y.o. M) Treating RN: Grover Canavan Primary Care Provider: [name] Other Clinician: Referring Provider: [name] Treating Provider/Extender: Melburn Hake, Ltanya Bayley [name]: 4 Diagnosis Coding ICD-10 Codes Code Description I89.0 Lymphedema, not elsewhere classified I87.2 Venous insufficiency (chronic) (peripheral) L97.822 Non-pressure chronic ulcer of other part of left lower leg with fat layer exposed Physician Procedures CPT4 Code: 7703403 Description: 52481 - WC PHYS LEVEL 3 - EST PT Modifier: Quantity: 1 CPT4 Code: Description: ICD-10 Diagnosis Description I89.0 Lymphedema, not elsewhere classified I87.2 Venous insufficiency (chronic) (peripheral) L97.822 Non-pressure chronic ulcer of other part of left lower leg with fat lay Modifier: er exposed Quantity: Electronic Signature(s) Signed: 03/05/2020 5:28:22 PM By: Worthy Keeler PA-C Entered By: Worthy Keeler on 03/05/2020 17:28:21

## 2020-03-06 DIAGNOSIS — I509 Heart failure, unspecified: Secondary | ICD-10-CM | POA: Diagnosis not present

## 2020-03-18 ENCOUNTER — Other Ambulatory Visit: Payer: Self-pay

## 2020-03-18 ENCOUNTER — Encounter: Payer: Self-pay | Admitting: Internal Medicine

## 2020-03-18 ENCOUNTER — Ambulatory Visit (INDEPENDENT_AMBULATORY_CARE_PROVIDER_SITE_OTHER): Payer: Medicare PPO | Admitting: Internal Medicine

## 2020-03-18 VITALS — BP 132/76 | HR 60 | Temp 97.4°F | Resp 16 | Ht 66.0 in | Wt 301.0 lb

## 2020-03-18 DIAGNOSIS — G4734 Idiopathic sleep related nonobstructive alveolar hypoventilation: Secondary | ICD-10-CM

## 2020-03-18 DIAGNOSIS — Z0001 Encounter for general adult medical examination with abnormal findings: Secondary | ICD-10-CM | POA: Diagnosis not present

## 2020-03-18 DIAGNOSIS — I890 Lymphedema, not elsewhere classified: Secondary | ICD-10-CM | POA: Diagnosis not present

## 2020-03-18 DIAGNOSIS — J209 Acute bronchitis, unspecified: Secondary | ICD-10-CM

## 2020-03-18 DIAGNOSIS — J440 Chronic obstructive pulmonary disease with acute lower respiratory infection: Secondary | ICD-10-CM

## 2020-03-18 DIAGNOSIS — E1142 Type 2 diabetes mellitus with diabetic polyneuropathy: Secondary | ICD-10-CM

## 2020-03-18 DIAGNOSIS — E1165 Type 2 diabetes mellitus with hyperglycemia: Secondary | ICD-10-CM | POA: Diagnosis not present

## 2020-03-18 DIAGNOSIS — R300 Dysuria: Secondary | ICD-10-CM

## 2020-03-18 LAB — POCT GLYCOSYLATED HEMOGLOBIN (HGB A1C): Hemoglobin A1C: 6.4 % — AB (ref 4.0–5.6)

## 2020-03-18 LAB — POCT UA - MICROALBUMIN
Albumin/Creatinine Ratio, Urine, POC: <30
Creatinine, POC: 50 mg/dL
Microalbumin Ur, POC: 10 mg/L

## 2020-03-18 MED ORDER — FLOVENT HFA 110 MCG/ACT IN AERO: INHALATION_SPRAY | RESPIRATORY_TRACT | 12 refills | Status: DC

## 2020-03-18 MED ORDER — INDAPAMIDE 2.5 MG PO TABS: 2.5000 mg | ORAL_TABLET | Freq: Every day | ORAL | 1 refills | Status: DC

## 2020-03-18 NOTE — Progress Notes (Addendum)
[facility] [place], [place] [address]  Internal MEDICINE  Office Visit Note  Patient Name: Adam Merritt  [id number]  [id number]  Date of Service: 03/18/2020  Chief Complaint  Patient presents with  . Annual Exam    both legs hurt, neuropathy  . Diabetes  . Hyperlipidemia  . Hypertension  . Anemia  . Quality Metric Gaps    flu shot  . policy update form    received     HPI Pt is here for routine health maintenance examination. He is here with his wife, pt has multiple medical problems, at his baseline however continues to have chest congestion and cough. He does use ventolin inhaler with not much relief. Chronic lymphedema present, slightly improved than before, DM is under good control, continue to eat out, pain is under good control with gabapentin. Pt is only on O2, cannot use CPAP. Chronic diastolic HF followed by Cardiology   Current Medication: Outpatient Encounter Medications as of 03/18/2020  Medication Sig  . albuterol (VENTOLIN HFA) 108 (90 Base) MCG/ACT inhaler Inhale 2 puffs into the lungs every 6 (six) hours as needed for wheezing or shortness of breath.  Marland Kitchen aspirin 325 MG EC tablet Take 325 mg by mouth daily.  Marland Kitchen atorvastatin (LIPITOR) 40 MG tablet Take 40 mg by mouth at bedtime.   . carvedilol (COREG) 25 MG tablet Take 25 mg by mouth 2 (two) times daily with a meal.  . docusate sodium (COLACE) 50 MG capsule Take 50 mg by mouth 2 (two) times daily.  . febuxostat (ULORIC) 40 MG tablet Take 1 tablet (40 mg total) by mouth daily.  . furosemide (LASIX) 40 MG tablet Take 1 tablet (40 mg total) by mouth daily.  Marland Kitchen gabapentin (NEURONTIN) 300 MG capsule Take 2 cap in the morning, 1 cap in the evening and 2 cap at night.  Marland Kitchen glucose blood test strip Use as instructed to check blood sugars twice daily  . hydrALAZINE (APRESOLINE) 25 MG tablet Take 25 mg by mouth 3 (three) times daily.   . indapamide (LOZOL) 2.5 MG tablet Take 1 tablet (2.5 mg  total) by mouth daily.  . Insulin Glargine (BASAGLAR KWIKPEN) 100 UNIT/ML INJECT 30 TO 36 UNITS UNDER THE SKIN EVERY DAY  . Insulin Pen Needle (BD PEN NEEDLE NANO U/F) 32G X 4 MM MISC Use as directed with insulin DX e11.65  . ipratropium-albuterol (DUONEB) 0.5-2.5 (3) MG/3ML SOLN Take 3 mLs by nebulization every 4 (four) hours as needed (for shortness of breath).  . liraglutide (VICTOZA) 18 MG/3ML SOPN INJECT 1.8MG  INTO THE SKIN DAILY  . Multiple Vitamin (MULTIVITAMIN WITH MINERALS) TABS tablet Take 1 tablet by mouth daily.  . ondansetron (ZOFRAN) 4 MG tablet Take 1 tablet (4 mg total) by mouth every 8 (eight) hours as needed.  [name] 95K MISC Use twice daily diag e11.65  . pentoxifylline (TRENTAL) 400 MG CR tablet TAKE 1 TABLET BY MOUTH THREE TIMES DAILY FOR CLAUDICATION  . potassium chloride SA (KLOR-CON) 20 MEQ tablet Take 1 tablet (20 mEq total) by mouth 2 (two) times daily. (Patient taking differently: Take 20 mEq by mouth 3 (three) times daily. )  . sennosides-docusate sodium (SENOKOT-S) 8.6-50 MG tablet Take 1-2 tablets by mouth 2 (two) times daily.  . sertraline (ZOLOFT) 100 MG tablet Take 100 mg by mouth daily.   . traZODone (DESYREL) 50 MG tablet Take 50 mg by mouth at bedtime.  . [DISCONTINUED] indapamide (LOZOL) 2.5 MG tablet  Take 1 tablet (2.5 mg total) by mouth daily.  . fluticasone (FLOVENT HFA) 110 MCG/ACT inhaler Take 2 puffs in am and pm  for copd // chronic bronchitis ( rinse mouth)  . [DISCONTINUED] azithromycin (ZITHROMAX) 250 MG tablet Take one tab a day for 10 days for uri (Patient not taking: Reported on 03/18/2020)  . [DISCONTINUED] metolazone (ZAROXOLYN) 2.5 MG tablet  (Patient not taking: Reported on 03/18/2020)  . [DISCONTINUED] prazosin (MINIPRESS) 1 MG capsule Take 3 capsules (3 mg total) by mouth at bedtime. (Patient not taking: Reported on 03/18/2020)   No facility-administered encounter medications on file as of 03/18/2020.    Surgical  History: Past Surgical History:  Procedure Laterality Date  . 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 CHOLECYSTECTOMY    . CORONARY ARTERY BYPASS GRAFT      Medical History: Past Medical History:  Diagnosis Date  . Anemia   . CAD (coronary artery disease)   . Chest pain   . Coronary artery disease   . Diabetes mellitus without complication ([place])   . Diastolic CHF ([place])   . Esophageal reflux   . Herpes zoster without mention of complication   . Hyperlipidemia   . Hypertension   . Insomnia   . Lumbago   . Lymphedema   . Neuropathy in diabetes ([place])   . Obstructive chronic bronchitis without exacerbation ([place])   . Other malaise and fatigue   . Stroke ([place])   . Varicose veins     Family History: Family History  Problem Relation Age of Onset  . Diabetes Mother   . Hyperlipidemia Mother   . Hypertension Mother   . Diabetes Father   . Hyperlipidemia Father   . Hypertension Father   . CAD Father       Review of Systems  Constitutional: Negative for chills, fatigue and unexpected weight change.  HENT: Positive for postnasal drip. Negative for congestion, rhinorrhea, sneezing and sore throat.   Eyes: Negative for redness.  Respiratory: Positive for cough and shortness of breath. Negative for chest tightness.   Cardiovascular: Positive for leg swelling. Negative for chest pain and palpitations.  Gastrointestinal: Negative for abdominal pain, constipation, diarrhea, nausea and vomiting.  Genitourinary: Negative for dysuria and frequency.  Musculoskeletal: Positive for arthralgias. Negative for back pain, joint swelling and neck pain.  Skin: Negative for rash.  Neurological: Negative.  Negative for tremors and numbness.  Hematological: Negative for adenopathy. Does not bruise/bleed easily.  Psychiatric/Behavioral: Negative for behavioral problems (Depression),  sleep disturbance and suicidal ideas. The patient is not nervous/anxious.      Vital Signs: BP 132/76   Pulse 60   Temp (!) 97.4 F (36.3 C)   Resp 16   Ht 5\' 6"  (1.676 m)   Wt (!) 301 lb (136.5 kg)   SpO2 95%   BMI 48.58 kg/m    Physical Exam Constitutional:      General: He is not in acute distress.    Appearance: He is well-developed. He is obese. He is not diaphoretic.  HENT:     Head: Normocephalic and atraumatic.     Mouth/Throat:     Pharynx: No oropharyngeal exudate.  Eyes:     Extraocular Movements: Extraocular movements intact.     Pupils: Pupils are equal, round, and reactive to light.  Neck:     Thyroid: No thyromegaly.     Vascular:  No JVD.     Trachea: No tracheal deviation.  Cardiovascular:     Rate and Rhythm: Normal rate and regular rhythm.     Pulses:          Dorsalis pedis pulses are 1+ on the right side and 1+ on the left side.     Heart sounds: Normal heart sounds. No murmur heard.  No friction rub. No gallop.   Pulmonary:     Effort: Pulmonary effort is normal. No respiratory distress.     Breath sounds: Wheezing present. No rales.  Chest:     Chest wall: No tenderness.  Abdominal:     General: Bowel sounds are normal.     Palpations: Abdomen is soft.  Musculoskeletal:     Cervical back: Normal range of motion and neck supple.     Right lower leg: Edema present.     Left lower leg: Edema present.     Right foot: Decreased range of motion.     Left foot: Decreased range of motion.  Feet:     Right foot:     Protective Sensation: 2 sites tested. 2 sites sensed.     Skin integrity: Skin breakdown, erythema, warmth and fissure present.     Left foot:     Protective Sensation: 2 sites tested. 2 sites sensed.     Skin integrity: Skin breakdown, erythema, warmth and fissure present.     Comments: Diabetic foot exam .. amputation of Great toe  Lymphadenopathy:     Cervical: No cervical adenopathy.  Skin:    General: Skin is warm and dry.      Findings: Erythema present.     Comments: Bilateral lower ext edema with chronic skin changes   Neurological:     Mental Status: He is alert and oriented to person, place, and time.     Cranial Nerves: No cranial nerve deficit.  Psychiatric:        Behavior: Behavior normal.        Thought Content: Thought content normal.        Judgment: Judgment normal.    LABS: Recent Results (from the past 2160 hour(s))  CBC     Status: Abnormal   Collection Time: 02/24/20 11:52 AM  Result Value Ref Range   WBC 7.4 4.0 - 10.5 K/uL   RBC 4.07 (L) 4.22 - 5.81 MIL/uL   Hemoglobin 12.7 (L) 13.0 - 17.0 g/dL   HCT 37.8 (L) 39 - 52 %   MCV 92.9 80.0 - 100.0 fL   MCH 31.2 26.0 - 34.0 pg   MCHC 33.6 30.0 - 36.0 g/dL   RDW 14.0 11.5 - 15.5 %   Platelets 151 150 - 400 K/uL   nRBC 0.0 0.0 - 0.2 %    Comment: Performed at [facility], [address] [place], [place] [address]  Basic metabolic panel     Status: Abnormal   Collection Time: 02/24/20 11:52 AM  Result Value Ref Range   Sodium 136 135 - 145 mmol/L   Potassium 3.7 3.5 - 5.1 mmol/L   Chloride 101 98 - 111 mmol/L   CO2 26 22 - 32 mmol/L   Glucose, Bld 123 (H) 70 - 99 mg/dL    Comment: Glucose reference range applies only to samples taken after fasting for at least 8 hours.   BUN 14 8 - 23 mg/dL   Creatinine, Ser 1.96 (H) 0.61 - 1.24 mg/dL   Calcium 8.8 (L) 8.9 - 10.3 mg/dL  GFR calc non Af Amer 32 (L) >60 mL/min   GFR calc Af Amer 38 (L) >60 mL/min   Anion gap 9 5 - 15    Comment: Performed at [facility], [place], [place] [address]  POCT HgB A1C     Status: Abnormal   Collection Time: 03/18/20  9:28 AM  Result Value Ref Range   Hemoglobin A1C 6.4 (A) 4.0 - 5.6 %   HbA1c POC (<> result, manual entry)     HbA1c, POC (prediabetic range)     HbA1c, POC (controlled diabetic range)    POCT UA - Microalbumin     Status: None   Collection Time: 03/18/20  4:51 PM  Result Value Ref Range    Microalbumin Ur, POC 10 mg/L   Creatinine, POC 50 mg/dL   Albumin/Creatinine Ratio, Urine, POC <30    Assessment/Plan: 1. Encounter for general adult medical examination with abnormal findings Pt has morbid obesity and is not interested in changing his eating habits, he is at his baseline.   2. Uncontrolled type 2 diabetes mellitus with hyperglycemia (HCC) - POCT HgB A1C blood sugar is under better control (6.4) today.  3. Lymphedema of both lower extremities Continue to see wound clinic and continue to wrap is legs - indapamide (LOZOL) 2.5 MG tablet; Take 1 tablet (2.5 mg total) by mouth daily.  Dispense: 90 tablet; Refill: 1  4. Nocturnal hypoxia Continue O2 therapy at night   5. Diabetic polyneuropathy associated with type 2 diabetes mellitus (HCC) Pt is on gabapentin and stable pain at this time  6. Acute bronchitis with COPD ([place]) Will add Flovent and will need follow CT  - fluticasone (FLOVENT HFA) 110 MCG/ACT inhaler; Take 2 puffs in am and pm  for copd // chronic bronchitis ( rinse mouth)  Dispense: 1 each; Refill: 12 - Pulmonary Function Test; Future  7. Dysuria - POCT HgB A1C - UA/M w/rflx Culture, Routine  General Counseling: Mccormick verbalizes understanding of the findings of todays visit and agrees with plan of treatment. I have discussed any further diagnostic evaluation that may be needed or ordered today. We also reviewed his medications today. he has been encouraged to call the office with any questions or concerns that should arise related to todays visit.   Orders Placed This Encounter  Procedures  . UA/M w/rflx Culture, Routine  . POCT HgB A1C  . POCT UA - Microalbumin  . Pulmonary Function Test   Meds ordered this encounter  Medications  . indapamide (LOZOL) 2.5 MG tablet    Sig: Take 1 tablet (2.5 mg total) by mouth daily.    Dispense:  90 tablet    Refill:  1  . fluticasone (FLOVENT HFA) 110 MCG/ACT inhaler    Sig: Take 2 puffs in am and pm  for copd  // chronic bronchitis ( rinse mouth)    Dispense:  1 each    Refill:  12      Total time spent: 35 Minutes  Time spent includes review of chart, medications, test results, and follow up plan with the patient.     [name], MD  Internal Medicine

## 2020-03-23 LAB — UA/M W/RFLX CULTURE, ROUTINE
Bilirubin, UA: NEGATIVE
Glucose, UA: NEGATIVE
Ketones, UA: NEGATIVE
Nitrite, UA: NEGATIVE
Protein,UA: NEGATIVE
RBC, UA: NEGATIVE
Specific Gravity, UA: 1.007 (ref 1.005–1.030)
Urobilinogen, Ur: 0.2 mg/dL (ref 0.2–1.0)
pH, UA: 6 (ref 5.0–7.5)

## 2020-03-23 LAB — MICROSCOPIC EXAMINATION
Casts: NONE SEEN /lpf
Epithelial Cells (non renal): NONE SEEN /hpf (ref 0–10)
RBC, Urine: NONE SEEN /hpf (ref 0–2)

## 2020-03-23 LAB — URINE CULTURE, REFLEX

## 2020-03-24 ENCOUNTER — Other Ambulatory Visit: Payer: Self-pay

## 2020-03-24 DIAGNOSIS — I890 Lymphedema, not elsewhere classified: Secondary | ICD-10-CM

## 2020-03-24 MED ORDER — INDAPAMIDE 2.5 MG PO TABS: 2.5000 mg | ORAL_TABLET | Freq: Every day | ORAL | 1 refills | Status: DC

## 2020-03-26 NOTE — Progress Notes (Signed)
Adam Merritt, Adam Merritt ([id number]) Visit Report for 02/19/2020 Arrival Information Details Patient Name: Adam Merritt, Adam Merritt Date of Service: 02/19/2020 10:45 AM Medical Record Number: [id number] Patient Account Number: [id number] Date of Birth/Sex: 1945/06/19 (74 y.o. M) Treating RN: Cornell Barman Primary Care Windie Marasco: [name] Other Clinician: Referring Zorion Nims: [name] Treating Athenia Rys/Extender: Melburn Hake, [name]: 2 Visit Information History Since Last Visit All ordered tests and consults were completed: No Patient Arrived: Wheel Chair Added or deleted any medications: No Arrival Time: 11:03 Any new allergies or adverse reactions: No Accompanied By: wife Had a fall or experienced change in No Transfer Assistance: None activities of daily living that may affect Patient Identification Verified: Yes risk of falls: Secondary Verification Process Completed: Yes Signs or symptoms of abuse/neglect since last visito No Patient Requires Transmission-Based Precautions: No Hospitalized since last visit: No Patient Has Alerts: No Implantable device outside of the clinic excluding No cellular tissue based products placed in the center since last visit: Has Dressing in Place as Prescribed: No Has Compression in Place as Prescribed: No Pain Present Now: No Electronic Signature(s) Signed: 02/19/2020 11:45:17 AM By: Darci Needle Entered By: Darci Needle on 02/19/2020 11:04:09 Adam Merritt ([id number]) -------------------------------------------------------------------------------- Compression Therapy Details Patient Name: Adam Merritt Date of Service: 02/19/2020 10:45 AM Medical Record Number: [id number] Patient Account Number: [id number] Date of Birth/Sex: [birth year] (74 y.o. M) Treating RN: Grover Canavan Primary Care Fredick Schlosser: [name] Other Clinician: Referring Jannelly Bergren: [name] Treating Pinky Ravan/Extender: Melburn Hake, [name]:  2 Compression Therapy Performed for Wound Assessment: Wound #36 Left,Anterior Lower Leg Performed By: Clinician Grover Canavan, RN Compression Type: Four Layer Post Procedure Diagnosis Same as Pre-procedure Electronic Signature(s) Signed: 02/19/2020 4:34:46 PM By: Grover Canavan Entered By: Grover Canavan on 02/19/2020 11:20:57 Adam Merritt ([id number]) -------------------------------------------------------------------------------- Encounter Discharge Information Details Patient Name: Adam Merritt Date of Service: 02/19/2020 10:45 AM Medical Record Number: [id number] Patient Account Number: [id number] Date of Birth/Sex: [birth year] (74 y.o. M) Treating RN: Grover Canavan Primary Care Jadamarie Butson: [name] Other Clinician: Referring Loretto Belinsky: [name] Treating Jaxston Chohan/Extender: Melburn Hake, [name]: 2 Encounter Discharge Information Items Discharge Condition: Stable Ambulatory Status: Wheelchair Discharge Destination: Home Transportation: Other Accompanied By: spouse Schedule Follow-up Appointment: Yes Clinical Summary of Care: Electronic Signature(s) Signed: 02/19/2020 4:34:46 PM By: Grover Canavan Entered By: Grover Canavan on 02/19/2020 11:25:16 Adam Merritt ([id number]) -------------------------------------------------------------------------------- Lower Extremity Assessment Details Patient Name: Adam Merritt Date of Service: 02/19/2020 10:45 AM Medical Record Number: [id number] Patient Account Number: [id number] Date of Birth/Sex: September 02, 1945 (74 y.o. M) Treating RN: Cornell Barman Primary Care Vivek Grealish: [name] Other Clinician: Referring Majed Pellegrin: [name] Treating Shimshon Narula/Extender: Melburn Hake, [name]: 2 Edema Assessment Assessed: [Left: Yes] [Right: Yes] Edema: [Left: Yes] [Right: Yes] Calf Left: Right: Point of Measurement: 27 cm From Medial Instep 58.5 cm 51 cm Ankle Left: Right: Point of Measurement: 12  cm From Medial Instep 41 cm 39 cm Vascular Assessment Pulses: Dorsalis Pedis Palpable: [Left:No] [Right:No] Doppler Audible: [Left:Yes] [Right:Yes] Posterior Tibial Palpable: [Left:No Yes] [Right:No Yes] Electronic Signature(s) Signed: 02/19/2020 11:45:17 AM By: Darci Needle Signed: 03/25/2020 6:09:58 PM By: Gretta Cool, BSN, RN, CWS, Kim RN, BSN Entered By: Darci Needle on 02/19/2020 11:14:04 Adam Merritt ([id number]) -------------------------------------------------------------------------------- Multi Wound Chart Details Patient Name: Adam Merritt Date of Service: 02/19/2020 10:45 AM Medical Record Number: [id number] Patient Account Number: [id number] Date of Birth/Sex: 1946/01/04 (74 y.o. M) Treating RN: Grover Canavan Primary Care Aubreana Cornacchia: [name]  Other Clinician: Referring Makinna Andy: [name] Treating Edith Groleau/Extender: STONE III, [name]: 2 Vital Signs Height(in): 65 Pulse(bpm): 98 Weight(lbs): 48 Blood Pressure(mmHg): 144/81 Body Mass Index(BMI): 48 Temperature(F): 98.3 Respiratory Rate(breaths/min): 26 Photos: [N/A:N/A] Wound Location: Left, Anterior Lower Leg N/A N/A Wounding Event: Gradually Appeared N/A N/A Primary Etiology: Venous Leg Ulcer N/A N/A Secondary Etiology: Diabetic Wound/Ulcer of the Lower N/A N/A Extremity Comorbid History: Anemia, Lymphedema, Chronic N/A N/A Obstructive Pulmonary Disease (COPD), Sleep Apnea, Congestive Heart Failure, Coronary Artery Disease, Hypertension, Peripheral Venous Disease, Type II Diabetes, Osteoarthritis, Neuropathy, Received Chemotherapy Date Acquired: 01/28/2020 N/A N/A Weeks of Treatment: 2 N/A N/A Wound Status: Open N/A N/A Measurements L x W x D (cm) 0.9x1x0.1 N/A N/A Area (cm) : 0.707 N/A N/A Volume (cm) : 0.071 N/A N/A % Reduction in Area: -157.10% N/A N/A % Reduction in Volume: -163.00% N/A N/A Classification: Full Thickness Without Exposed N/A N/A Support  Structures Exudate Amount: Medium N/A N/A Exudate Type: Serous N/A N/A Exudate Color: amber N/A N/A Wound Margin: Flat and Intact N/A N/A Granulation Amount: None Present (0%) N/A N/A Necrotic Amount: Large (67-100%) N/A N/A Exposed Structures: Fat Layer (Subcutaneous Tissue): N/A N/A Yes Fascia: No Tendon: No Muscle: No Joint: No Bone: No Epithelialization: None N/A N/A Treatment Notes Adam Merritt, Adam Merritt ([id number]) Electronic Signature(s) Signed: 02/19/2020 4:34:46 PM By: Grover Canavan Entered By: Grover Canavan on 02/19/2020 11:18:01 Adam Merritt ([id number]) -------------------------------------------------------------------------------- [place] Details Patient Name: Adam Merritt Date of Service: 02/19/2020 10:45 AM Medical Record Number: [id number] Patient Account Number: [id number] Date of Birth/Sex: 1945/08/03 (74 y.o. M) Treating RN: Cornell Barman Primary Care Shed Nixon: [name] Other Clinician: Referring Liel Rudden: [name] Treating Crayton Savarese/Extender: Melburn Hake, [name]: 2 Active Inactive Wound/Skin Impairment Nursing Diagnoses: Impaired tissue integrity Knowledge deficit related to ulceration/compromised skin integrity Goals: Patient/caregiver will verbalize understanding of skin care regimen Date Initiated: 02/05/2020 Target Resolution Date: 02/20/2020 Goal Status: Active Interventions: Assess patient/caregiver ability to obtain necessary supplies Assess patient/caregiver ability to perform ulcer/skin care regimen upon admission and as needed Assess ulceration(s) every visit Provide education on ulcer and skin care Treatment Activities: Skin care regimen initiated : 02/05/2020 Notes: Electronic Signature(s) Signed: 02/19/2020 4:34:46 PM By: Grover Canavan Signed: 03/25/2020 6:09:58 PM By: Gretta Cool, BSN, RN, CWS, Kim RN, BSN Entered By: Grover Canavan on 02/19/2020 11:17:48 Adam Merritt  ([id number]) -------------------------------------------------------------------------------- Pain Assessment Details Patient Name: Adam Merritt Date of Service: 02/19/2020 10:45 AM Medical Record Number: [id number] Patient Account Number: [id number] Date of Birth/Sex: [birth year] (74 y.o. M) Treating RN: Cornell Barman Primary Care Drinda Belgard: [name] Other Clinician: Referring Karlee Staff: [name] Treating Quantavious Eggert/Extender: Melburn Hake, [name]: 2 Active Problems Location of Pain Severity and Description of Pain Patient Has Paino No Site Locations With Dressing Change: No Pain Management and Medication Current Pain Management: Electronic Signature(s) Signed: 02/19/2020 11:45:17 AM By: Darci Needle Signed: 03/25/2020 6:09:58 PM By: Gretta Cool, BSN, RN, CWS, Kim RN, BSN Entered By: Darci Needle on 02/19/2020 11:04:56 Adam Merritt ([id number]) -------------------------------------------------------------------------------- Patient/Caregiver Education Details Patient Name: Adam Merritt Date of Service: 02/19/2020 10:45 AM Medical Record Number: [id number] Patient Account Number: [id number] Date of Birth/Gender: 12-31-1945 (74 y.o. M) Treating RN: Grover Canavan Primary Care Physician: [name] Other Clinician: Referring Physician: [name] Treating Physician/Extender: Sharalyn Ink in Treatment: 2 Education Assessment Education Provided To: Patient Education Topics Provided Wound/Skin Impairment: Handouts: Caring for Your Ulcer Methods: Explain/Verbal Responses: State content correctly Electronic Signature(s) Signed:  02/19/2020 4:34:46 PM By: Grover Canavan Entered By: Grover Canavan on 02/19/2020 11:18:30 Adam Merritt ([id number]) -------------------------------------------------------------------------------- Wound Assessment Details Patient Name: Adam Merritt Date of Service: 02/19/2020 10:45 AM Medical Record Number:  [id number] Patient Account Number: [id number] Date of Birth/Sex: 21-Feb-1946 (74 y.o. M) Treating RN: Cornell Barman Primary Care Yehia Mcbain: [name] Other Clinician: Referring Leianne Callins: [name] Treating Carlosdaniel Grob/Extender: Melburn Hake, [name]: 2 Wound Status Wound Number: 36 Primary Venous Leg Ulcer Etiology: Wound Location: Left, Anterior Lower Leg Secondary Diabetic Wound/Ulcer of the Lower Extremity Wounding Event: Gradually Appeared Etiology: Date Acquired: 01/28/2020 Wound Open Weeks Of Treatment: 2 Status: Clustered Wound: No Comorbid Anemia, Lymphedema, Chronic Obstructive Pulmonary History: Disease (COPD), Sleep Apnea, Congestive Heart Failure, Coronary Artery Disease, Hypertension, Peripheral Venous Disease, Type II Diabetes, Osteoarthritis, Neuropathy, Received Chemotherapy Photos Wound Measurements Length: (cm) 0.9 Width: (cm) 1 Depth: (cm) 0.1 Area: (cm) 0.707 Volume: (cm) 0.071 % Reduction in Area: -157.1% % Reduction in Volume: -163% Epithelialization: None Wound Description Classification: Full Thickness Without Exposed Support Structu Wound Margin: Flat and Intact Exudate Amount: Medium Exudate Type: Serous Exudate Color: amber res Foul Odor After Cleansing: No Slough/Fibrino Yes Wound Bed Granulation Amount: None Present (0%) Exposed Structure Necrotic Amount: Large (67-100%) Fascia Exposed: No Necrotic Quality: Adherent Slough Fat Layer (Subcutaneous Tissue) Exposed: Yes Tendon Exposed: No Muscle Exposed: No Joint Exposed: No Bone Exposed: No Electronic Signature(s) Signed: 02/19/2020 11:45:17 AM By: Darci Needle Signed: 03/25/2020 6:09:58 PM By: Gretta Cool, BSN, RN, CWS, Kim RN, BSN Entered By: Darci Needle on 02/19/2020 11:13:18 Adam Merritt ([id number]) Adam Merritt, Adam Merritt ([id number]) -------------------------------------------------------------------------------- [place] Details Patient Name: Adam Merritt Date of  Service: 02/19/2020 10:45 AM Medical Record Number: [id number] Patient Account Number: [id number] Date of Birth/Sex: 05-31-1946 (74 y.o. M) Treating RN: Cornell Barman Primary Care Wiliam Cauthorn: [name] Other Clinician: Referring Kymora Sciara: [name] Treating Dyke Weible/Extender: Melburn Hake, [name]: 2 Vital Signs Time Taken: 10:30 Temperature (F): 98.3 Height (in): 65 Pulse (bpm): 79 Weight (lbs): 289 Respiratory Rate (breaths/min): 26 Body Mass Index (BMI): 48.1 Blood Pressure (mmHg): 144/81 Reference Range: 80 - 120 mg / dl Electronic Signature(s) Signed: 02/19/2020 11:45:17 AM By: Darci Needle Entered By: Darci Needle on 02/19/2020 11:04:39

## 2020-03-30 DIAGNOSIS — I890 Lymphedema, not elsewhere classified: Secondary | ICD-10-CM | POA: Diagnosis not present

## 2020-04-06 DIAGNOSIS — I509 Heart failure, unspecified: Secondary | ICD-10-CM | POA: Diagnosis not present

## 2020-04-07 ENCOUNTER — Other Ambulatory Visit: Payer: Self-pay

## 2020-04-07 DIAGNOSIS — I890 Lymphedema, not elsewhere classified: Secondary | ICD-10-CM

## 2020-04-07 MED ORDER — INDAPAMIDE 2.5 MG PO TABS: 2.5000 mg | ORAL_TABLET | Freq: Every day | ORAL | 1 refills | Status: DC

## 2020-04-15 DIAGNOSIS — I110 Hypertensive heart disease with heart failure: Secondary | ICD-10-CM | POA: Diagnosis not present

## 2020-04-15 DIAGNOSIS — F4310 Post-traumatic stress disorder, unspecified: Secondary | ICD-10-CM | POA: Diagnosis not present

## 2020-04-15 DIAGNOSIS — G4700 Insomnia, unspecified: Secondary | ICD-10-CM | POA: Diagnosis not present

## 2020-04-15 DIAGNOSIS — Z794 Long term (current) use of insulin: Secondary | ICD-10-CM | POA: Diagnosis not present

## 2020-04-15 DIAGNOSIS — Z7982 Long term (current) use of aspirin: Secondary | ICD-10-CM | POA: Diagnosis not present

## 2020-04-15 DIAGNOSIS — E785 Hyperlipidemia, unspecified: Secondary | ICD-10-CM | POA: Diagnosis not present

## 2020-04-15 DIAGNOSIS — L97909 Non-pressure chronic ulcer of unspecified part of unspecified lower leg with unspecified severity: Secondary | ICD-10-CM | POA: Diagnosis not present

## 2020-04-15 DIAGNOSIS — Z8249 Family history of ischemic heart disease and other diseases of the circulatory system: Secondary | ICD-10-CM | POA: Diagnosis not present

## 2020-04-15 DIAGNOSIS — Z7722 Contact with and (suspected) exposure to environmental tobacco smoke (acute) (chronic): Secondary | ICD-10-CM | POA: Diagnosis not present

## 2020-04-15 DIAGNOSIS — Z9981 Dependence on supplemental oxygen: Secondary | ICD-10-CM | POA: Diagnosis not present

## 2020-04-15 DIAGNOSIS — I509 Heart failure, unspecified: Secondary | ICD-10-CM | POA: Diagnosis not present

## 2020-04-15 DIAGNOSIS — F419 Anxiety disorder, unspecified: Secondary | ICD-10-CM | POA: Diagnosis not present

## 2020-04-15 DIAGNOSIS — Z8673 Personal history of transient ischemic attack (TIA), and cerebral infarction without residual deficits: Secondary | ICD-10-CM | POA: Diagnosis not present

## 2020-04-15 DIAGNOSIS — I951 Orthostatic hypotension: Secondary | ICD-10-CM | POA: Diagnosis not present

## 2020-04-15 DIAGNOSIS — Z79899 Other long term (current) drug therapy: Secondary | ICD-10-CM | POA: Diagnosis not present

## 2020-04-15 DIAGNOSIS — E1142 Type 2 diabetes mellitus with diabetic polyneuropathy: Secondary | ICD-10-CM | POA: Diagnosis not present

## 2020-04-15 DIAGNOSIS — G3184 Mild cognitive impairment, so stated: Secondary | ICD-10-CM | POA: Diagnosis not present

## 2020-04-15 DIAGNOSIS — I2510 Atherosclerotic heart disease of native coronary artery without angina pectoris: Secondary | ICD-10-CM | POA: Diagnosis not present

## 2020-04-15 DIAGNOSIS — F325 Major depressive disorder, single episode, in full remission: Secondary | ICD-10-CM | POA: Diagnosis not present

## 2020-04-15 DIAGNOSIS — Z6841 Body Mass Index (BMI) 40.0 and over, adult: Secondary | ICD-10-CM | POA: Diagnosis not present

## 2020-04-15 DIAGNOSIS — G4733 Obstructive sleep apnea (adult) (pediatric): Secondary | ICD-10-CM | POA: Diagnosis not present

## 2020-04-15 DIAGNOSIS — J449 Chronic obstructive pulmonary disease, unspecified: Secondary | ICD-10-CM | POA: Diagnosis not present

## 2020-04-15 DIAGNOSIS — G8929 Other chronic pain: Secondary | ICD-10-CM | POA: Diagnosis not present

## 2020-04-19 ENCOUNTER — Other Ambulatory Visit: Payer: Self-pay

## 2020-04-19 ENCOUNTER — Ambulatory Visit (INDEPENDENT_AMBULATORY_CARE_PROVIDER_SITE_OTHER): Payer: Medicare PPO | Admitting: Internal Medicine

## 2020-04-19 ENCOUNTER — Encounter: Payer: Self-pay | Admitting: Internal Medicine

## 2020-04-19 VITALS — BP 127/57 | HR 65 | Temp 97.9°F | Resp 16 | Ht 66.0 in | Wt 288.0 lb

## 2020-04-19 DIAGNOSIS — I890 Lymphedema, not elsewhere classified: Secondary | ICD-10-CM | POA: Diagnosis not present

## 2020-04-19 DIAGNOSIS — G4733 Obstructive sleep apnea (adult) (pediatric): Secondary | ICD-10-CM

## 2020-04-19 DIAGNOSIS — R0602 Shortness of breath: Secondary | ICD-10-CM | POA: Diagnosis not present

## 2020-04-19 DIAGNOSIS — J449 Chronic obstructive pulmonary disease, unspecified: Secondary | ICD-10-CM | POA: Diagnosis not present

## 2020-04-19 NOTE — Patient Instructions (Signed)
Chronic Obstructive Pulmonary Disease Chronic obstructive pulmonary disease (COPD) is a long-term (chronic) lung problem. When you have COPD, it is hard for air to get in and out of your lungs. Usually the condition gets worse over time, and your lungs will never return to normal. There are things you can do to keep yourself as healthy as possible.  Your doctor may treat your condition with: ? Medicines. ? Oxygen. ? Lung surgery.  Your doctor may also recommend: ? Rehabilitation. This includes steps to make your body work better. It may involve a team of specialists. ? Quitting smoking, if you smoke. ? Exercise and changes to your diet. ? Comfort measures (palliative care). Follow these instructions at home: Medicines  Take over-the-counter and prescription medicines only as told by your doctor.  Talk to your doctor before taking any cough or allergy medicines. You may need to avoid medicines that cause your lungs to be dry. Lifestyle  If you smoke, stop. Smoking makes the problem worse. If you need help quitting, ask your doctor.  Avoid being around things that make your breathing worse. This may include smoke, chemicals, and fumes.  Stay active, but remember to rest as well.  Learn and use tips on how to relax.  Make sure you get enough sleep. Most adults need at least 7 hours of sleep every night.  Eat healthy foods. Eat smaller meals more often. Rest before meals. Controlled breathing Learn and use tips on how to control your breathing as told by your doctor. Try:  Breathing in (inhaling) through your nose for 1 second. Then, pucker your lips and breath out (exhale) through your lips for 2 seconds.  Putting one hand on your belly (abdomen). Breathe in slowly through your nose for 1 second. Your hand on your belly should move out. Pucker your lips and breathe out slowly through your lips. Your hand on your belly should move in as you breathe out.  Controlled coughing Learn  and use controlled coughing to clear mucus from your lungs. Follow these steps: 1. Lean your head a little forward. 2. Breathe in deeply. 3. Try to hold your breath for 3 seconds. 4. Keep your mouth slightly open while coughing 2 times. 5. Spit any mucus out into a tissue. 6. Rest and do the steps again 1 or 2 times as needed. General instructions  Make sure you get all the shots (vaccines) that your doctor recommends. Ask your doctor about a flu shot and a pneumonia shot.  Use oxygen therapy and pulmonary rehabilitation if told by your doctor. If you need home oxygen therapy, ask your doctor if you should buy a tool to measure your oxygen level (oximeter).  Make a COPD action plan with your doctor. This helps you to know what to do if you feel worse than usual.  Manage any other conditions you have as told by your doctor.  Avoid going outside when it is very hot, cold, or humid.  Avoid people who have a sickness you can catch (contagious).  Keep all follow-up visits as told by your doctor. This is important. Contact a doctor if:  You cough up more mucus than usual.  There is a change in the color or thickness of the mucus.  It is harder to breathe than usual.  Your breathing is faster than usual.  You have trouble sleeping.  You need to use your medicines more often than usual.  You have trouble doing your normal activities such as getting dressed   or walking around the house. Get help right away if:  You have shortness of breath while resting.  You have shortness of breath that stops you from: ? Being able to talk. ? Doing normal activities.  Your chest hurts for longer than 5 minutes.  Your skin color is more blue than usual.  Your pulse oximeter shows that you have low oxygen for longer than 5 minutes.  You have a fever.  You feel too tired to breathe normally. Summary  Chronic obstructive pulmonary disease (COPD) is a long-term lung problem.  The way your  lungs work will never return to normal. Usually the condition gets worse over time. There are things you can do to keep yourself as healthy as possible.  Take over-the-counter and prescription medicines only as told by your doctor.  If you smoke, stop. Smoking makes the problem worse. This information is not intended to replace advice given to you by your health care provider. Make sure you discuss any questions you have with your health care provider. Document Revised: 05/04/2017 Document Reviewed: 06/26/2016 [employer] Patient Education  2020 [employer]  

## 2020-04-19 NOTE — Progress Notes (Signed)
[facility] [place], [place] [address]  Pulmonary Sleep Medicine   Office Visit Note  Patient Name: Adam Merritt DOB: [birth year] MRN [id number]  Date of Service: 04/19/2020  Complaints/HPI: OSA SOB CHF diastolic COPD EDEMA he is doing about the same.  He has some obstructive sleep apnea and he refuses to use the CPAP.  He has severe lymphedema has been on chronic diuresis for this.  The patient been also seen by cardiology.  Since his last visit does not appear to be much in the way of any worsening of his condition.  Wife states that time she wraps his legs because there is a propensity towards breakdown of the skin  ROS  General: (-) fever, (-) chills, (-) night sweats, (-) weakness Skin: (-) rashes, (-) itching,. Eyes: (-) visual changes, (-) redness, (-) itching. Nose and Sinuses: (-) nasal stuffiness or itchiness, (-) postnasal drip, (-) nosebleeds, (-) sinus trouble. Mouth and Throat: (-) sore throat, (-) hoarseness. Neck: (-) swollen glands, (-) enlarged thyroid, (-) neck pain. Respiratory: - cough, (-) bloody sputum, + shortness of breath, - wheezing. Cardiovascular: ++ ankle swelling, (-) chest pain. Lymphatic: (-) lymph node enlargement. Neurologic: (-) numbness, (-) tingling. Psychiatric: (-) anxiety, (-) depression   Current Medication: Outpatient Encounter Medications as of 04/19/2020  Medication Sig  . albuterol (VENTOLIN HFA) 108 (90 Base) MCG/ACT inhaler Inhale 2 puffs into the lungs every 6 (six) hours as needed for wheezing or shortness of breath.  Marland Kitchen aspirin 325 MG EC tablet Take 325 mg by mouth daily.  Marland Kitchen atorvastatin (LIPITOR) 40 MG tablet Take 40 mg by mouth at bedtime.   . carvedilol (COREG) 25 MG tablet Take 25 mg by mouth 2 (two) times daily with a meal.  . docusate sodium (COLACE) 50 MG capsule Take 50 mg by mouth 2 (two) times daily.  . febuxostat (ULORIC) 40 MG tablet Take 1 tablet (40 mg total) by mouth daily.  .  fluticasone (FLOVENT HFA) 110 MCG/ACT inhaler Take 2 puffs in am and pm  for copd // chronic bronchitis ( rinse mouth)  . furosemide (LASIX) 40 MG tablet Take 1 tablet (40 mg total) by mouth daily.  Marland Kitchen gabapentin (NEURONTIN) 300 MG capsule Take 2 cap in the morning, 1 cap in the evening and 2 cap at night.  Marland Kitchen glucose blood test strip Use as instructed to check blood sugars twice daily  . hydrALAZINE (APRESOLINE) 25 MG tablet Take 25 mg by mouth 3 (three) times daily.   . indapamide (LOZOL) 2.5 MG tablet Take 1 tablet (2.5 mg total) by mouth daily.  . Insulin Glargine (BASAGLAR KWIKPEN) 100 UNIT/ML INJECT 30 TO 36 UNITS UNDER THE SKIN EVERY DAY  . Insulin Pen Needle (BD PEN NEEDLE NANO U/F) 32G X 4 MM MISC Use as directed with insulin DX e11.65  . ipratropium-albuterol (DUONEB) 0.5-2.5 (3) MG/3ML SOLN Take 3 mLs by nebulization every 4 (four) hours as needed (for shortness of breath).  . liraglutide (VICTOZA) 18 MG/3ML SOPN INJECT 1.8MG  INTO THE SKIN DAILY  . Multiple Vitamin (MULTIVITAMIN WITH MINERALS) TABS tablet Take 1 tablet by mouth daily.  . ondansetron (ZOFRAN) 4 MG tablet Take 1 tablet (4 mg total) by mouth every 8 (eight) hours as needed.  [name] 91M MISC Use twice daily diag e11.65  . pentoxifylline (TRENTAL) 400 MG CR tablet TAKE 1 TABLET BY MOUTH THREE TIMES DAILY FOR CLAUDICATION  . potassium chloride SA (KLOR-CON) 20 MEQ tablet Take  1 tablet (20 mEq total) by mouth 2 (two) times daily. (Patient taking differently: Take 20 mEq by mouth 3 (three) times daily. )  . sennosides-docusate sodium (SENOKOT-S) 8.6-50 MG tablet Take 1-2 tablets by mouth 2 (two) times daily.  . sertraline (ZOLOFT) 100 MG tablet Take 100 mg by mouth daily.   . traZODone (DESYREL) 50 MG tablet Take 50 mg by mouth at bedtime.   No facility-administered encounter medications on file as of 04/19/2020.    Surgical History: Past Surgical History:  Procedure Laterality Date  . AMPUTATION TOE  Right 06/23/2018   Procedure: AMPUTATION TOE;  Surgeon: Sharlotte Alamo, DPM;  Location: [facility] ORS;  Service: Podiatry;  Laterality: Right;  . CHOLECYSTECTOMY    . CORONARY ARTERY BYPASS GRAFT      Medical History: Past Medical History:  Diagnosis Date  . Anemia   . CAD (coronary artery disease)   . Chest pain   . Coronary artery disease   . Diabetes mellitus without complication ([place])   . Diastolic CHF ([place])   . Esophageal reflux   . Herpes zoster without mention of complication   . Hyperlipidemia   . Hypertension   . Insomnia   . Lumbago   . Lymphedema   . Neuropathy in diabetes ([place])   . Obstructive chronic bronchitis without exacerbation ([place])   . Other malaise and fatigue   . Stroke ([place])   . Varicose veins     Family History: Family History  Problem Relation Age of Onset  . Diabetes Mother   . Hyperlipidemia Mother   . Hypertension Mother   . Diabetes Father   . Hyperlipidemia Father   . Hypertension Father   . CAD Father     Social History: Social History   Socioeconomic History  . Marital status: Married    Spouse name: Not on file  . Number of children: Not on file  . Years of education: Not on file  . Highest education level: Not on file  Occupational History  . Occupation: retired  Tobacco Use  . Smoking status: Never Smoker  . Smokeless tobacco: Never Used  Vaping Use  . Vaping Use: Never used  Substance and Sexual Activity  . Alcohol use: No    Comment: quit alcohol 30 years ago  . Drug use: No  . Sexual activity: Not Currently  Other Topics Concern  . Not on file  Social History Narrative   Lives at home with wife, uses a wheelchair now   Social Determinants of Health   Financial Resource Strain:   . Difficulty of Paying Living Expenses: Not on file  Food Insecurity:   . Worried About Charity fundraiser in the Last Year: Not on file  . Ran Out of Food in the Last Year: Not on file  Transportation Needs:   . Lack of Transportation  (Medical): Not on file  . Lack of Transportation (Non-Medical): Not on file  Physical Activity:   . Days of Exercise per Week: Not on file  . Minutes of Exercise per Session: Not on file  Stress:   . Feeling of Stress : Not on file  Social Connections:   . Frequency of Communication with Friends and Family: Not on file  . Frequency of Social Gatherings with Friends and Family: Not on file  . Attends Religious Services: Not on file  . Active Member of Clubs or Organizations: Not on file  . Attends Archivist Meetings: Not on file  .  Marital Status: Not on file  Intimate Partner Violence:   . Fear of Current or Ex-Partner: Not on file  . Emotionally Abused: Not on file  . Physically Abused: Not on file  . Sexually Abused: Not on file    Vital Signs: Blood pressure (!) 127/57, pulse 65, temperature 97.9 F (36.6 C), resp. rate 16, height 5\' 6"  (1.676 m), weight 288 lb (130.6 kg), SpO2 99 %.  Examination: General Appearance: The patient is well-developed, well-nourished, and in no distress. Skin: Gross inspection of skin unremarkable. Head: normocephalic, no gross deformities. Eyes: no gross deformities noted. ENT: ears appear grossly normal no exudates. Neck: Supple. No thyromegaly. No LAD. Respiratory: no rhonchi noted. Cardiovascular: Normal S1 and S2 without murmur or rub. Extremities: No cyanosis. pulses are equal. Neurologic: Alert and oriented. No involuntary movements.  LABS: Recent Results (from the past 2160 hour(s))  CBC     Status: Abnormal   Collection Time: 02/24/20 11:52 AM  Result Value Ref Range   WBC 7.4 4.0 - 10.5 K/uL   RBC 4.07 (L) 4.22 - 5.81 MIL/uL   Hemoglobin 12.7 (L) 13.0 - 17.0 g/dL   HCT 37.8 (L) 39 - 52 %   MCV 92.9 80.0 - 100.0 fL   MCH 31.2 26.0 - 34.0 pg   MCHC 33.6 30.0 - 36.0 g/dL   RDW 14.0 11.5 - 15.5 %   Platelets 151 150 - 400 K/uL   nRBC 0.0 0.0 - 0.2 %    Comment: Performed at [facility], [address] [place], [place] [address]  Basic metabolic panel     Status: Abnormal   Collection Time: 02/24/20 11:52 AM  Result Value Ref Range   Sodium 136 135 - 145 mmol/L   Potassium 3.7 3.5 - 5.1 mmol/L   Chloride 101 98 - 111 mmol/L   CO2 26 22 - 32 mmol/L   Glucose, Bld 123 (H) 70 - 99 mg/dL    Comment: Glucose reference range applies only to samples taken after fasting for at least 8 hours.   BUN 14 8 - 23 mg/dL   Creatinine, Ser 1.96 (H) 0.61 - 1.24 mg/dL   Calcium 8.8 (L) 8.9 - 10.3 mg/dL   GFR calc non Af Amer 32 (L) >60 mL/min   GFR calc Af Amer 38 (L) >60 mL/min   Anion gap 9 5 - 15    Comment: Performed at [facility], [address] [place], [place] [address]  POCT HgB A1C     Status: Abnormal   Collection Time: 03/18/20  9:28 AM  Result Value Ref Range   Hemoglobin A1C 6.4 (A) 4.0 - 5.6 %   HbA1c POC (<> result, manual entry)     HbA1c, POC (prediabetic range)     HbA1c, POC (controlled diabetic range)    UA/M w/rflx Culture, Routine     Status: Abnormal   Collection Time: 03/18/20 10:20 AM   Specimen: Urine   Urine  Result Value Ref Range   Specific Gravity, UA 1.007 1.005 - 1.030   pH, UA 6.0 5.0 - 7.5   Color, UA Yellow Yellow   Appearance Ur Clear Clear   Leukocytes,UA 1+ (A) Negative   Protein,UA Negative Negative/Trace   Glucose, UA Negative Negative   Ketones, UA Negative Negative   RBC, UA Negative Negative   Bilirubin, UA Negative Negative   Urobilinogen, Ur 0.2 0.2 - 1.0 mg/dL   Nitrite, UA Negative Negative   Microscopic Examination See below:  Comment: Microscopic was indicated and was performed.   Urinalysis Reflex Comment     Comment: This specimen has reflexed to a Urine Culture.  Microscopic Examination     Status: Abnormal   Collection Time: 03/18/20 10:20 AM   Urine  Result Value Ref Range   WBC, UA 6-10 (A) 0 - 5 /hpf   RBC None seen 0 - 2 /hpf   Epithelial Cells (non renal) None seen 0 - 10 /hpf   Casts None seen None seen /lpf    Bacteria, UA Few None seen/Few  Urine Culture, Reflex     Status: Abnormal   Collection Time: 03/18/20 10:20 AM   Urine  Result Value Ref Range   Urine Culture, Routine Final report (A)    Organism ID, Bacteria Enterococcus faecalis (A)     Comment: Greater than 100,000 colony forming units per mL   Antimicrobial Susceptibility Comment     Comment:       ** S = Susceptible; I = Intermediate; R = Resistant **                    P = Positive; N = Negative             MICS are expressed in micrograms per mL    Antibiotic                 RSLT#1    RSLT#2    RSLT#3    RSLT#4 Ciprofloxacin                  S Levofloxacin                   S Nitrofurantoin                 S Penicillin                     S Tetracycline                   S Vancomycin                     S   POCT UA - Microalbumin     Status: None   Collection Time: 03/18/20  4:51 PM  Result Value Ref Range   Microalbumin Ur, POC 10 mg/L   Creatinine, POC 50 mg/dL   Albumin/Creatinine Ratio, Urine, POC <30     Radiology: DG Chest 2 View  Result Date: 02/24/2020 CLINICAL DATA:  Cough, acute asthmatic bronchitis EXAM: CHEST - 2 VIEW COMPARISON:  January 17th 2020 FINDINGS: Separation of the sternotomy exhibited on plain film by discontinuous sternotomy wires and seen on prior chest CT with similar appearance compared to prior radiograph. The cardiomediastinal contours with stable cardiac enlargement and signs of CABG. Lungs are clear.  Persistent elevation of the LEFT hemidiaphragm. On limited assessment skeletal structures without acute process. IMPRESSION: 1. No acute cardiopulmonary disease. 2. Discontinuous sternotomy wires as seen on prior radiographs and CT imaging. Electronically Signed   By: Zetta Bills M.D.   On: 02/24/2020 16:15    No results found.  No results found.    Assessment and Plan: Patient Active Problem List   Diagnosis Date Noted  . PTSD (post-traumatic stress disorder) 12/08/2019  . Type  2 diabetes mellitus with hyperglycemia ([place]) 03/19/2019  . Aplastic anemia, unspecified ([place]) 03/19/2019  . Acute pain of right knee 02/23/2019  . Uncontrolled type  2 diabetes mellitus with hyperglycemia ([place]) 09/17/2018  . Primary insomnia 08/07/2018  . Bacteremia due to group B Streptococcus 07/02/2018  . COPD (chronic obstructive pulmonary disease) ([place]) 05/13/2018  . Dysuria 05/05/2018  . Encounter for general adult medical examination with abnormal findings 05/05/2018  . CHF exacerbation ([place]) 04/16/2018  . Achilles tendon disorder, right 04/02/2018  . Essential hypertension 01/28/2018  . Lymphedema of both lower extremities 01/28/2018  . Cellulitis and abscess of lower extremity 09/20/2017  . Edema of both lower legs due to peripheral venous insufficiency 09/20/2017  . Cellulitis 09/20/2017  . Atopic dermatitis 09/02/2017  . Chronic pain disorder 09/02/2017  . CVA (cerebral vascular accident) ([place]) 09/15/2016  . 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 CVA (cerebral infarction) 02/12/2015  . Chronic diastolic CHF (congestive heart failure), NYHA class 3 ([place]) 11/30/2014  . Benign essential hypertension 11/06/2014  . CAD (coronary artery disease) of artery bypass graft 01/19/2014  . CKD (chronic kidney disease), stage III ([place]) 01/19/2014  . Acute bronchitis with COPD ([place]) 01/19/2014  . Diabetic neuropathy ([place]) 01/19/2014  . Insulin dependent diabetes mellitus 01/19/2014  . OSA (obstructive sleep apnea) 01/19/2014  . Mixed hyperlipidemia 10/29/2013  . Peripheral vascular disease, unspecified ([place]) 09/29/2013    1. COPD continue with present medical management.  The patient is doing fine with albuterol Flovent combination duo nebs wean the nebulizer form.  Encourage compliance with the current medical regimen. 2. OSA non compliant he will not use it. States anything on his face does not work. Other methods have not been succesful 3. Edema chronic uses leg wrapping and sees  wound care for it 4. CHF diastolic needs to monitor his pressures as well as his fluid status closely.  General Counseling: I have discussed the findings of the evaluation and examination with [place].  I have also discussed any further diagnostic evaluation thatmay be needed or ordered today. [place] verbalizes understanding of the findings of todays visit. We also reviewed his medications today and discussed drug interactions and side effects including but not limited excessive drowsiness and altered mental states. We also discussed that there is always a risk not just to him but also people around him. he has been encouraged to call the office with any questions or concerns that should arise related to todays visit.  Orders Placed This Encounter  Procedures  . Spirometry with Graph    Order Specific Question:   Where should this test be performed?    Answer:   [facility]    Order Specific Question:   Basic spirometry    Answer:   Yes    Order Specific Question:   Spirometry pre & post bronchodilator    Answer:   No     Time spent: 67min  I have personally obtained a history, examined the patient, evaluated laboratory and imaging results, formulated the assessment and plan and placed orders.    Allyne Gee, MD [facility] Pulmonary and Critical Care Sleep medicine

## 2020-04-28 ENCOUNTER — Ambulatory Visit: Payer: Medicare PPO | Admitting: Internal Medicine

## 2020-05-05 ENCOUNTER — Telehealth: Payer: Self-pay

## 2020-05-05 NOTE — Telephone Encounter (Signed)
Pt needs f64f appt for PT  Order for home health

## 2020-05-05 NOTE — Telephone Encounter (Signed)
Called to schedule a f53f appt, unable to leave message.

## 2020-05-05 NOTE — Telephone Encounter (Signed)
Had [facility] call pt to make him appt for a face to face to get order for home health PT.  Barnetta Chapel from [facility] case manager called and stated she spoke to pt and he has been having a lot of falles at home and she is asking for pt to get Physical therapy for balance and strengthening.   Her call back number is [phone]  Ext: [phone]

## 2020-05-06 DIAGNOSIS — I509 Heart failure, unspecified: Secondary | ICD-10-CM | POA: Diagnosis not present

## 2020-05-11 ENCOUNTER — Ambulatory Visit: Payer: Medicare PPO | Admitting: Hospice and Palliative Medicine

## 2020-05-18 ENCOUNTER — Other Ambulatory Visit: Payer: Self-pay

## 2020-05-18 ENCOUNTER — Encounter: Payer: Self-pay | Admitting: Hospice and Palliative Medicine

## 2020-05-18 ENCOUNTER — Ambulatory Visit (INDEPENDENT_AMBULATORY_CARE_PROVIDER_SITE_OTHER): Payer: Medicare PPO | Admitting: Hospice and Palliative Medicine

## 2020-05-18 VITALS — BP 138/84 | HR 80 | Temp 98.1°F | Resp 16 | Ht 66.0 in | Wt 288.0 lb

## 2020-05-18 DIAGNOSIS — Z0189 Encounter for other specified special examinations: Secondary | ICD-10-CM | POA: Diagnosis not present

## 2020-05-18 DIAGNOSIS — Z742 Need for assistance at home and no other household member able to render care: Secondary | ICD-10-CM | POA: Diagnosis not present

## 2020-05-18 DIAGNOSIS — I890 Lymphedema, not elsewhere classified: Secondary | ICD-10-CM

## 2020-05-18 DIAGNOSIS — E876 Hypokalemia: Secondary | ICD-10-CM

## 2020-05-18 DIAGNOSIS — J441 Chronic obstructive pulmonary disease with (acute) exacerbation: Secondary | ICD-10-CM | POA: Diagnosis not present

## 2020-05-18 DIAGNOSIS — I872 Venous insufficiency (chronic) (peripheral): Secondary | ICD-10-CM

## 2020-05-18 DIAGNOSIS — S161XXA Strain of muscle, fascia and tendon at neck level, initial encounter: Secondary | ICD-10-CM

## 2020-05-18 DIAGNOSIS — E1142 Type 2 diabetes mellitus with diabetic polyneuropathy: Secondary | ICD-10-CM | POA: Diagnosis not present

## 2020-05-18 MED ORDER — GABAPENTIN 300 MG PO CAPS: ORAL_CAPSULE | ORAL | 3 refills | Status: DC

## 2020-05-18 MED ORDER — FUROSEMIDE 40 MG PO TABS: 40.0000 mg | ORAL_TABLET | Freq: Every day | ORAL | 1 refills | Status: DC

## 2020-05-18 MED ORDER — BENZONATATE 100 MG PO CAPS: 100.0000 mg | ORAL_CAPSULE | Freq: Two times a day (BID) | ORAL | 0 refills | Status: DC | PRN

## 2020-05-18 MED ORDER — POTASSIUM CHLORIDE CRYS ER 20 MEQ PO TBCR: 20.0000 meq | EXTENDED_RELEASE_TABLET | Freq: Two times a day (BID) | ORAL | 5 refills | Status: DC

## 2020-05-18 MED ORDER — TIZANIDINE HCL 2 MG PO CAPS: 2.0000 mg | ORAL_CAPSULE | Freq: Three times a day (TID) | ORAL | 0 refills | Status: DC

## 2020-05-18 MED ORDER — CYCLOBENZAPRINE HCL 5 MG PO TABS: 5.0000 mg | ORAL_TABLET | Freq: Two times a day (BID) | ORAL | 0 refills | Status: DC | PRN

## 2020-05-18 MED ORDER — AZITHROMYCIN 250 MG PO TABS: ORAL_TABLET | ORAL | 0 refills | Status: DC

## 2020-05-18 NOTE — Progress Notes (Signed)
[facility] [place], [place] [address]  Internal MEDICINE  Office Visit Note  Patient Name: Adam Merritt  [id number]  [id number]  Date of Service: 05/21/2020  Chief Complaint  Patient presents with   Follow-up    Medication and PFT follow up.   Cough    Feels like bronchitis is coming back again .Marland Kitchen states that it occurs a few times a year.   Medication Refill    Needs Gabapentin refilled.    HPI Patient is here for routine follow-up Wife is present in exam today Together they are poor historians, unable to get a clear understanding of the medications he is taking and how he is taking them In wheelchair today due to bilateral lower extremity lymphedema--he is followed and managed by wound clinic Currently has bilateral legs wrapped--chronic wounds, wife states they are in various stages of healing, has had multiple debridements by wound care--schedule to follow-up later this week Followed by cardiology for CAD s/p bypass graft, diastolic CHF-stable, latest EF >55%  Also followed by multiple providers through [place] system--unable to provide updates on recent visits  Complaining today of coughing--productive as well as chest congestion and wheezing, typically diagnosed with bronchitis this time of the year-denies fevers, shortness of breath or difficulty breathing Also complaining of neck tightness and soreness--he knows this is due to staring at his computer throughout the day and night, he spends his day in his chair or bed propped up playing games on his computer  Requesting PT and home health care as he is becoming more reliant on wheelchair for ambulation, due to his weight and his wife own medical conditions it is difficult for her to transfer for him around the home--he requires assistance with ADL's   Current Medication: Outpatient Encounter Medications as of 05/18/2020  Medication Sig Note   albuterol (VENTOLIN HFA) 108 (90 Base) MCG/ACT inhaler  Inhale 2 puffs into the lungs every 6 (six) hours as needed for wheezing or shortness of breath.    aspirin 325 MG EC tablet Take 325 mg by mouth daily.    atorvastatin (LIPITOR) 40 MG tablet Take 40 mg by mouth at bedtime.     carvedilol (COREG) 25 MG tablet Take 25 mg by mouth 2 (two) times daily with a meal.    docusate sodium (COLACE) 50 MG capsule Take 50 mg by mouth 2 (two) times daily.    febuxostat (ULORIC) 40 MG tablet Take 1 tablet (40 mg total) by mouth daily.    fluticasone (FLOVENT HFA) 110 MCG/ACT inhaler Take 2 puffs in am and pm  for copd // chronic bronchitis ( rinse mouth)    glucose blood test strip Use as instructed to check blood sugars twice daily    hydrALAZINE (APRESOLINE) 25 MG tablet Take 25 mg by mouth 3 (three) times daily.     indapamide (LOZOL) 2.5 MG tablet Take 1 tablet (2.5 mg total) by mouth daily.    Insulin Glargine (BASAGLAR KWIKPEN) 100 UNIT/ML INJECT 30 TO 36 UNITS UNDER THE SKIN EVERY DAY    Insulin Pen Needle (BD PEN NEEDLE NANO U/F) 32G X 4 MM MISC Use as directed with insulin DX e11.65    ipratropium-albuterol (DUONEB) 0.5-2.5 (3) MG/3ML SOLN Take 3 mLs by nebulization every 4 (four) hours as needed (for shortness of breath).    liraglutide (VICTOZA) 18 MG/3ML SOPN INJECT 1.8MG  INTO THE SKIN DAILY    Multiple Vitamin (MULTIVITAMIN WITH MINERALS) TABS tablet Take 1 tablet by mouth  daily.    ondansetron (ZOFRAN) 4 MG tablet Take 1 tablet (4 mg total) by mouth every 8 (eight) hours as needed.    OneTouch Delica Lancets 76B MISC Use twice daily diag e11.65    pentoxifylline (TRENTAL) 400 MG CR tablet TAKE 1 TABLET BY MOUTH THREE TIMES DAILY FOR CLAUDICATION    sennosides-docusate sodium (SENOKOT-S) 8.6-50 MG tablet Take 1-2 tablets by mouth 2 (two) times daily.    sertraline (ZOLOFT) 100 MG tablet Take 100 mg by mouth daily.     traZODone (DESYREL) 50 MG tablet Take 50 mg by mouth at bedtime.    [DISCONTINUED] furosemide (LASIX) 40 MG  tablet Take 1 tablet (40 mg total) by mouth daily.    [DISCONTINUED] gabapentin (NEURONTIN) 300 MG capsule Take 2 cap in the morning, 1 cap in the evening and 2 cap at night.    [DISCONTINUED] potassium chloride SA (KLOR-CON) 20 MEQ tablet Take 1 tablet (20 mEq total) by mouth 2 (two) times daily. (Patient taking differently: Take 20 mEq by mouth 3 (three) times daily.)    azithromycin (ZITHROMAX) 250 MG tablet Take one tab po qd for 14 days    benzonatate (TESSALON) 100 MG capsule Take 1 capsule (100 mg total) by mouth 2 (two) times daily as needed for cough.    gabapentin (NEURONTIN) 300 MG capsule Take 2 cap in the morning, 1 cap in the evening and 2 cap at night.    potassium chloride SA (KLOR-CON) 20 MEQ tablet Take 1 tablet (20 mEq total) by mouth 2 (two) times daily.    [DISCONTINUED] tizanidine (ZANAFLEX) 2 MG capsule Take 1 capsule (2 mg total) by mouth 3 (three) times daily. 05/18/2020: not covered by insurance    No facility-administered encounter medications on file as of 05/18/2020.    Surgical History: Past Surgical History:  Procedure Laterality Date   AMPUTATION TOE Right 06/23/2018   Procedure: AMPUTATION TOE;  Surgeon: Sharlotte Alamo, DPM;  Location: [facility] ORS;  Service: Podiatry;  Laterality: Right;   CHOLECYSTECTOMY     CORONARY ARTERY BYPASS GRAFT      Medical History: Past Medical History:  Diagnosis Date   Anemia    CAD (coronary artery disease)    Chest pain    Coronary artery disease    Diabetes mellitus without complication (HCC)    Diastolic CHF ([place])    Esophageal reflux    Herpes zoster without mention of complication    Hyperlipidemia    Hypertension    Insomnia    Lumbago    Lymphedema    Neuropathy in diabetes (HCC)    Obstructive chronic bronchitis without exacerbation (HCC)    Other malaise and fatigue    Stroke ([place])    Varicose veins     Family History: Family History  Problem Relation Age of Onset   Diabetes  Mother    Hyperlipidemia Mother    Hypertension Mother    Diabetes Father    Hyperlipidemia Father    Hypertension Father    CAD Father     Social History   Socioeconomic History   Marital status: Married    Spouse name: Not on file   Number of children: Not on file   Years of education: Not on file   Highest education level: Not on file  Occupational History   Occupation: retired  Tobacco Use   Smoking status: Never Smoker   Smokeless tobacco: Never Used  Scientific laboratory technician Use: Never used  Substance and  Sexual Activity   Alcohol use: No    Comment: quit alcohol 30 years ago   Drug use: No   Sexual activity: Not Currently  Other Topics Concern   Not on file  Social History Narrative   Lives at home with wife, uses a wheelchair now   Social Determinants of Health   Financial Resource Strain: Not on file  Food Insecurity: Not on file  Transportation Needs: Not on file  Physical Activity: Not on file  Stress: Not on file  Social Connections: Not on file  Intimate Partner Violence: Not on file   Review of Systems  Constitutional: Negative for chills, fatigue and unexpected weight change.  HENT: Positive for congestion. Negative for postnasal drip, rhinorrhea, sneezing and sore throat.   Eyes: Negative for redness.  Respiratory: Positive for cough and wheezing. Negative for chest tightness and shortness of breath.   Cardiovascular: Positive for leg swelling. Negative for chest pain and palpitations.  Gastrointestinal: Negative for abdominal pain, constipation, diarrhea, nausea and vomiting.  Genitourinary: Negative for dysuria and frequency.  Musculoskeletal: Positive for neck stiffness. Negative for arthralgias, back pain, joint swelling and neck pain.  Skin: Negative for rash.  Neurological: Positive for weakness. Negative for tremors and numbness.  Hematological: Negative for adenopathy. Does not bruise/bleed easily.  Psychiatric/Behavioral:  Negative for behavioral problems (Depression), sleep disturbance and suicidal ideas. The patient is not nervous/anxious.     Vital Signs: BP 138/84    Pulse 80    Temp 98.1 F (36.7 C)    Resp 16    Ht 5\' 6"  (1.676 m)    Wt 288 lb (130.6 kg)    SpO2 98%    BMI 46.48 kg/m    Physical Exam Vitals reviewed.  Constitutional:      Appearance: Normal appearance. He is obese.  Cardiovascular:     Rate and Rhythm: Normal rate and regular rhythm.     Pulses: Normal pulses.     Heart sounds: Normal heart sounds.  Pulmonary:     Effort: Pulmonary effort is normal.     Breath sounds: Normal breath sounds.  Abdominal:     General: Abdomen is flat.     Palpations: Abdomen is soft.  Musculoskeletal:        General: Normal range of motion.     Cervical back: Tenderness present.     Right lower leg: 4+ Edema present.     Left lower leg: 4+ Edema present.  Skin:    General: Skin is warm.  Neurological:     General: No focal deficit present.     Mental Status: He is alert and oriented to person, place, and time. Mental status is at baseline.  Psychiatric:        Mood and Affect: Mood normal.        Behavior: Behavior normal.        Thought Content: Thought content normal.        Judgment: Judgment normal.    Assessment/Plan: 1. Obstructive chronic bronchitis with exacerbation (HCC)  Treat acute exacerbation with extended course of azithromycin, may take benzonatate for cough Advised to contact office if symptoms worsen or have not improved within 14 days - azithromycin (ZITHROMAX) 250 MG tablet; Take one tab po qd for 14 days  Dispense: 14 tablet; Refill: 0 - benzonatate (TESSALON) 100 MG capsule; Take 1 capsule (100 mg total) by mouth 2 (two) times daily as needed for cough.  Dispense: 20 capsule; Refill: 0  2. Diabetic  polyneuropathy associated with type 2 diabetes mellitus (HCC) Pain stable, requesting refills today, continue to monitor - gabapentin (NEURONTIN) 300 MG capsule; Take 2  cap in the morning, 1 cap in the evening and 2 cap at night.  Dispense: 180 capsule; Refill: 3  3. Lymphedema of both lower extremities Legs wrapped today--continue close follow-up and management with would clinic  4. Strain of neck muscle, initial encounter Encouraged to take multiple breaks from computer during the day and neck exercises to perform to loosen muscles-may apply heat/ice Flexeril sent to pharmacy to be taken as needed  5. Obesity, morbid ([place]) Discussed the importance of weight management through healthy eating and daily exercise as tolerated. Discussed the negative effects obesity has on pulmonary health, cardiac health as well as overall general health and well being.   6. Hypokalemia Requesting refills, continue with replacement as he is taking furosemide daily - potassium chloride SA (KLOR-CON) 20 MEQ tablet; Take 1 tablet (20 mEq total) by mouth 2 (two) times daily.  Dispense: 60 tablet; Refill: 5  7. Need for home health care Face to face encounter Due to chronic morbidities such as chronic bronchitis, lymphedema with 4+ edema, neuropathy and obesity requires assistance with ADL's--wife unable to help due to her own disabilities Need assistance with grooming, dressing, preparing food  8. Need for physical therapy Face to face encounter Deconditioning due to lymphedema and has become dependent upon wheelchair for ambulation Needs to regain strength in legs  General Counseling: Neng verbalizes understanding of the findings of todays visit and agrees with plan of treatment. I have discussed any further diagnostic evaluation that may be needed or ordered today. We also reviewed his medications today. he has been encouraged to call the office with any questions or concerns that should arise related to todays visit.    Meds ordered this encounter  Medications   gabapentin (NEURONTIN) 300 MG capsule    Sig: Take 2 cap in the morning, 1 cap in the evening and 2 cap at  night.    Dispense:  180 capsule    Refill:  3    Ok to fill this prescription early. Thanks.   potassium chloride SA (KLOR-CON) 20 MEQ tablet    Sig: Take 1 tablet (20 mEq total) by mouth 2 (two) times daily.    Dispense:  60 tablet    Refill:  5   azithromycin (ZITHROMAX) 250 MG tablet    Sig: Take one tab po qd for 14 days    Dispense:  14 tablet    Refill:  0   benzonatate (TESSALON) 100 MG capsule    Sig: Take 1 capsule (100 mg total) by mouth 2 (two) times daily as needed for cough.    Dispense:  20 capsule    Refill:  0   DISCONTD: tizanidine (ZANAFLEX) 2 MG capsule    Sig: Take 1 capsule (2 mg total) by mouth 3 (three) times daily.    Dispense:  90 capsule    Refill:  0    Time spent: 30 Minutes Time spent includes review of chart, medications, test results and follow-up plan with the patient.  This patient was seen by Theodoro Grist AGNP-C in Collaboration with Dr [name] as a part of collaborative care agreement     Coulter Oldaker. Hatsuko Bizzarro AGNP-C Internal medicine

## 2020-05-20 ENCOUNTER — Other Ambulatory Visit: Payer: Self-pay

## 2020-05-20 ENCOUNTER — Encounter: Payer: Medicare PPO | Attending: Physician Assistant | Admitting: Physician Assistant

## 2020-05-20 DIAGNOSIS — E11621 Type 2 diabetes mellitus with foot ulcer: Secondary | ICD-10-CM | POA: Diagnosis not present

## 2020-05-20 DIAGNOSIS — K5900 Constipation, unspecified: Secondary | ICD-10-CM | POA: Insufficient documentation

## 2020-05-20 DIAGNOSIS — S99921A Unspecified injury of right foot, initial encounter: Secondary | ICD-10-CM | POA: Diagnosis not present

## 2020-05-20 DIAGNOSIS — I779 Disorder of arteries and arterioles, unspecified: Secondary | ICD-10-CM | POA: Insufficient documentation

## 2020-05-20 DIAGNOSIS — I2510 Atherosclerotic heart disease of native coronary artery without angina pectoris: Secondary | ICD-10-CM | POA: Insufficient documentation

## 2020-05-20 DIAGNOSIS — I110 Hypertensive heart disease with heart failure: Secondary | ICD-10-CM | POA: Diagnosis not present

## 2020-05-20 DIAGNOSIS — L97822 Non-pressure chronic ulcer of other part of left lower leg with fat layer exposed: Secondary | ICD-10-CM | POA: Diagnosis not present

## 2020-05-20 DIAGNOSIS — I890 Lymphedema, not elsewhere classified: Secondary | ICD-10-CM | POA: Diagnosis not present

## 2020-05-20 DIAGNOSIS — J449 Chronic obstructive pulmonary disease, unspecified: Secondary | ICD-10-CM | POA: Insufficient documentation

## 2020-05-20 DIAGNOSIS — I509 Heart failure, unspecified: Secondary | ICD-10-CM | POA: Insufficient documentation

## 2020-05-20 DIAGNOSIS — I872 Venous insufficiency (chronic) (peripheral): Secondary | ICD-10-CM | POA: Insufficient documentation

## 2020-05-20 DIAGNOSIS — E11622 Type 2 diabetes mellitus with other skin ulcer: Secondary | ICD-10-CM | POA: Insufficient documentation

## 2020-05-20 DIAGNOSIS — L97222 Non-pressure chronic ulcer of left calf with fat layer exposed: Secondary | ICD-10-CM | POA: Diagnosis not present

## 2020-05-21 ENCOUNTER — Encounter: Payer: Self-pay | Admitting: Hospice and Palliative Medicine

## 2020-05-21 DIAGNOSIS — I890 Lymphedema, not elsewhere classified: Secondary | ICD-10-CM | POA: Diagnosis not present

## 2020-05-21 NOTE — Progress Notes (Signed)
Adam, Merritt ([id number]) Visit Report for 05/20/2020 Allergy List Details Patient Name: Adam Merritt, Adam Merritt Date of Service: 05/20/2020 2:30 PM Medical Record Number: [id number] Patient Account Number: [id number] Date of Birth/Sex: 09/05/1945 (74 y.o. M) Treating RN: Cornell Barman Primary Care Renad Jenniges: [name] Other Clinician: Referring Ellaree Gear: [name] Treating Ireland Chagnon/Extender: Jeri Cos [name]: 0 Allergies Active Allergies Corticosteroids (Glucocorticoids) Allergy Notes Electronic Signature(s) Signed: 05/20/2020 5:16:15 PM By: Gretta Cool, BSN, RN, CWS, Kim RN, BSN Entered By: Gretta Cool, BSN, RN, CWS, Kim on 05/20/2020 14:49:40 Adam Merritt ([id number]) -------------------------------------------------------------------------------- Arrival Information Details Patient Name: Adam Merritt Date of Service: 05/20/2020 2:30 PM Medical Record Number: [id number] Patient Account Number: [id number] Date of Birth/Sex: 10/14/1945 (74 y.o. M) Treating RN: Cornell Barman Primary Care Jahfari Ambers: [name] Other Clinician: Referring Romelle Reiley: [name] Treating Rhia Blatchford/Extender: Skipper Cliche in Treatment: 0 Visit Information Patient Arrived: Wheel Chair Arrival Time: 14:43 Accompanied By: wife Transfer Assistance: None Patient Identification Verified: Yes Secondary Verification Process Completed: Yes History Since Last Visit Electronic Signature(s) Signed: 05/20/2020 5:16:15 PM By: Gretta Cool, BSN, RN, CWS, Kim RN, BSN Entered By: Gretta Cool, BSN, RN, CWS, Kim on 05/20/2020 14:45:41 Adam Merritt ([id number]) -------------------------------------------------------------------------------- Clinic Level of Care Assessment Details Patient Name: Adam Merritt Date of Service: 05/20/2020 2:30 PM Medical Record Number: [id number] Patient Account Number: [id number] Date of Birth/Sex: 1946-03-20 (74 y.o. M) Treating RN: Cornell Barman Primary Care Marieclaire Bettenhausen: [name] Other  Clinician: Referring Hale Chalfin: [name] Treating Jacqualynn Parco/Extender: Skipper Cliche in Treatment: 0 Clinic Level of Care Assessment Items TOOL 2 Quantity Score []  - Use when only an EandM is performed on the INITIAL visit 0 ASSESSMENTS - Nursing Assessment / Reassessment X - General Physical Exam (combine w/ comprehensive assessment (listed just below) when performed on new 1 20 pt. evals) X- 1 25 Comprehensive Assessment (HX, ROS, Risk Assessments, Wounds Hx, etc.) ASSESSMENTS - Wound and Skin Assessment / Reassessment []  - Simple Wound Assessment / Reassessment - one wound 0 X- 2 5 Complex Wound Assessment / Reassessment - multiple wounds []  - 0 Dermatologic / Skin Assessment (not related to wound area) ASSESSMENTS - Ostomy and/or Continence Assessment and Care []  - Incontinence Assessment and Management 0 []  - 0 Ostomy Care Assessment and Management (repouching, etc.) PROCESS - Coordination of Care X - Simple Patient / Family Education for ongoing care 1 15 []  - 0 Complex (extensive) Patient / Family Education for ongoing care X- 1 10 Staff obtains Programmer, systems, Records, Test Results / Process Orders []  - 0 Staff telephones HHA, Nursing Homes / Clarify orders / etc []  - 0 Routine Transfer to another Facility (non-emergent condition) []  - 0 Routine Hospital Admission (non-emergent condition) X- 1 15 New Admissions / Biomedical engineer / Ordering NPWT, Apligraf, etc. []  - 0 Emergency Hospital Admission (emergent condition) X- 1 10 Simple Discharge Coordination []  - 0 Complex (extensive) Discharge Coordination PROCESS - Special Needs []  - Pediatric / Minor Patient Management 0 []  - 0 Isolation Patient Management []  - 0 Hearing / Language / Visual special needs []  - 0 Assessment of Community assistance (transportation, D/C planning, etc.) []  - 0 Additional assistance / Altered mentation []  - 0 Support Surface(s) Assessment (bed, cushion, seat,  etc.) INTERVENTIONS - Wound Cleansing / Measurement X - Wound Imaging (photographs - any number of wounds) 1 5 []  - 0 Wound Tracing (instead of photographs) []  - 0 Simple Wound Measurement - one wound X- 2 5 Complex Wound Measurement - multiple wounds [name]  W. ([id number]) []  - 0 Simple Wound Cleansing - one wound X- 2 5 Complex Wound Cleansing - multiple wounds INTERVENTIONS - Wound Dressings []  - Small Wound Dressing one or multiple wounds 0 []  - 0 Medium Wound Dressing one or multiple wounds X- 1 20 Large Wound Dressing one or multiple wounds []  - 0 Application of Medications - injection INTERVENTIONS - Miscellaneous []  - External ear exam 0 []  - 0 Specimen Collection (cultures, biopsies, blood, body fluids, etc.) []  - 0 Specimen(s) / Culture(s) sent or taken to Lab for analysis []  - 0 Patient Transfer (multiple staff / Civil Service fast streamer / Similar devices) []  - 0 Simple Staple / Suture removal (25 or less) []  - 0 Complex Staple / Suture removal (26 or more) []  - 0 Hypo / Hyperglycemic Management (close monitor of Blood Glucose) []  - 0 Ankle / Brachial Index (ABI) - do not check if billed separately Has the patient been seen at the hospital within the last three years: Yes Total Score: 150 Level Of Care: New/Established - Level 4 Electronic Signature(s) Signed: 05/20/2020 5:16:15 PM By: Gretta Cool, BSN, RN, CWS, Kim RN, BSN Entered By: Gretta Cool, BSN, RN, CWS, Kim on 05/20/2020 16:01:26 Adam Merritt ([id number]) -------------------------------------------------------------------------------- Encounter Discharge Information Details Patient Name: Adam Merritt Date of Service: 05/20/2020 2:30 PM Medical Record Number: [id number] Patient Account Number: [id number] Date of Birth/Sex: Jan 01, 1946 (74 y.o. M) Treating RN: Cornell Barman Primary Care Raymie Trani: [name] Other Clinician: Referring Brysun Eschmann: [name] Treating Calum Cormier/Extender: Skipper Cliche in Treatment:  0 Encounter Discharge Information Items Post Procedure Vitals Discharge Condition: Stable Temperature (F): 98.2 Ambulatory Status: Wheelchair Pulse (bpm): 66 Discharge Destination: Home Respiratory Rate (breaths/min): 16 Transportation: Private Auto Blood Pressure (mmHg): 156/83 Accompanied By: self Schedule Follow-up Appointment: Yes Clinical Summary of Care: Electronic Signature(s) Signed: 05/20/2020 4:05:45 PM By: Gretta Cool, BSN, RN, CWS, Kim RN, BSN Previous Signature: 05/20/2020 4:05:23 PM Version By: Gretta Cool, BSN, RN, CWS, Kim RN, BSN Entered By: Gretta Cool, BSN, RN, CWS, Kim on 05/20/2020 16:05:44 Adam Merritt ([id number]) -------------------------------------------------------------------------------- Lower Extremity Assessment Details Patient Name: Adam Merritt Date of Service: 05/20/2020 2:30 PM Medical Record Number: [id number] Patient Account Number: [id number] Date of Birth/Sex: Feb 02, 1946 (74 y.o. M) Treating RN: Cornell Barman Primary Care Keeghan Bialy: [name] Other Clinician: Referring Dalores Weger: [name] Treating Heiley Shaikh/Extender: Jeri Cos [name]: 0 Edema Assessment Assessed: [Left: Yes] [Right: No] Edema: [Left: Ye] [Right: s] Calf Left: Right: Point of Measurement: 30 cm From Medial Instep 55 cm Ankle Left: Right: Point of Measurement: 10 cm From Medial Instep 37 cm Vascular Assessment Pulses: Dorsalis Pedis Palpable: [Left:Yes Yes] Electronic Signature(s) Signed: 05/20/2020 5:16:15 PM By: Gretta Cool, BSN, RN, CWS, Kim RN, BSN Entered By: Gretta Cool, BSN, RN, CWS, Kim on 05/20/2020 15:06:33 Adam Merritt ([id number]) -------------------------------------------------------------------------------- Multi Wound Chart Details Patient Name: Adam Merritt Date of Service: 05/20/2020 2:30 PM Medical Record Number: [id number] Patient Account Number: [id number] Date of Birth/Sex: 21-Sep-1945 (74 y.o. M) Treating RN: Cornell Barman Primary Care Jalik Gellatly: [name] Other Clinician: Referring Shakeita Vandevander: [name] Treating Goldye Tourangeau/Extender: Jeri Cos [name]: 0 Vital Signs Height(in): 66 Pulse(bpm): 64 Weight(lbs): 288 Blood Pressure(mmHg): 153/83 Body Mass Index(BMI): 46 Temperature(F): 98.2 Respiratory Rate(breaths/min): 18 Photos: [37:No Photos] [38:No Photos] [N/A:N/A] Wound Location: [37:Left, Medial Lower Leg] [38:Left, Posterior Lower Leg] [N/A:N/A] Wounding Event: [37:Gradually Appeared] [38:Gradually Appeared] [N/A:N/A] Primary Etiology: [37:Lymphedema] [38:Lymphedema] [N/A:N/A] 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] [N/A:N/A] Comorbid History: [37:Anemia,  Lymphedema, Chronic Obstructive Pulmonary Disease (COPD), Sleep Apnea, Congestive Heart Failure, Coronary Artery Disease, Hypertension, Peripheral Venous Disease, Type II Diabetes, Osteoarthritis, Neuropathy, Received  Chemotherapy] [38:Anemia, Lymphedema, Chronic Obstructive Pulmonary Disease (COPD), Sleep Apnea, Congestive Heart Failure, Coronary Artery Disease, Hypertension, Peripheral Venous Disease, Type II Diabetes, Osteoarthritis, Neuropathy, Received  Chemotherapy] [N/A:N/A] Date Acquired: [37:05/03/2020] [38:05/03/2020] [N/A:N/A] Weeks of Treatment: [37:0] [38:0] [N/A:N/A] Wound Status: [37:Open] [38:Open] [N/A:N/A] Measurements L x W x D (cm) [37:0.5x0.5x0.1] [38:2x7x0.1] [N/A:N/A] Area (cm) : [37:0.196] [38:10.996] [N/A:N/A] Volume (cm) : [37:0.02] [38:1.1] [N/A:N/A] % Reduction in Area: [37:N/A] [38:0.00%] [N/A:N/A] % Reduction in Volume: [37:N/A] [38:0.00%] [N/A:N/A] Classification: [37:Full Thickness Without Exposed Support Structures] [38:Full Thickness Without Exposed Support Structures] [N/A:N/A] Exudate Amount: [37:Large] [38:Large] [N/A:N/A] Exudate Type: [37:Serous] [38:Serous] [N/A:N/A] Exudate Color: [37:amber] [38:amber] [N/A:N/A] Wound Margin: [37:Flat and  Intact] [38:N/A] [N/A:N/A] Granulation Amount: [37:None Present (0%)] [38:Large (67-100%)] [N/A:N/A] Necrotic Amount: [37:None Present (0%)] [38:None Present (0%)] [N/A:N/A] Exposed Structures: [37:Fascia: No Fat Layer (Subcutaneous Tissue): No Tendon: No Muscle: No Joint: No Bone: No] [38:Fat Layer (Subcutaneous Tissue): Yes Fascia: No Tendon: No Muscle: No Joint: No Bone: No] [N/A:N/A] Epithelialization: [37:Large (67-100%)] [38:Large (67-100%)] [N/A:N/A] Debridement: [37:Chemical/Enzymatic/Mechanical] [38:Chemical/Enzymatic/Mechanical] [N/A:N/A] Pre-procedure Verification/Time 15:00 [38:15:00] [N/A:N/A] Out Taken: Instrument: [37:Other(saline and gauze)] [38:Other(saline and gauze)] [N/A:N/A] Bleeding: [37:None] [38:None] [N/A:N/A] Debridement Treatment [37:Procedure was tolerated well] [38:Procedure was tolerated well] [N/A:N/A] Response: Post Debridement [37:1x1x0.1] [38:2x7x0.1] [N/A:N/A] Measurements L x W x D (cm) Post Debridement Volume: [37:0.079] [38:1.1] [N/A:N/A] (cm) Procedures Performed: [37:Debridement] [38:Debridement] [N/A:N/A] Treatment Notes CALAHAN, PAK ([id number]) Electronic Signature(s) Signed: 05/20/2020 5:16:15 PM By: Gretta Cool, BSN, RN, CWS, Kim RN, BSN Entered By: Gretta Cool, BSN, RN, CWS, Kim on 05/20/2020 15:39:26 Adam Merritt ([id number]) -------------------------------------------------------------------------------- [place] Details Patient Name: Adam Merritt Date of Service: 05/20/2020 2:30 PM Medical Record Number: [id number] Patient Account Number: [id number] Date of Birth/Sex: 1945-09-11 (74 y.o. M) Treating RN: Cornell Barman Primary Care Cotton Beckley: [name] Other Clinician: Referring Jayvyn Haselton: [name] Treating Jaylee Lantry/Extender: Skipper Cliche in Treatment: 0 Active Inactive Orientation to the Wound Care Program Nursing Diagnoses: Knowledge deficit related to the wound healing center program Goals: Patient/caregiver  will verbalize understanding of the [facility] Program Date Initiated: 05/20/2020 Target Resolution Date: 05/20/2020 Goal Status: Active Interventions: Provide education on orientation to the wound center Notes: Wound/Skin Impairment Nursing Diagnoses: Impaired tissue integrity Goals: Patient/caregiver will verbalize understanding of skin care regimen Date Initiated: 05/20/2020 Target Resolution Date: 06/20/2020 Goal Status: Active Interventions: Assess ulceration(s) every visit Treatment Activities: Referred to DME Tavonna Worthington for dressing supplies : 05/20/2020 Notes: Electronic Signature(s) Signed: 05/20/2020 5:16:15 PM By: Gretta Cool, BSN, RN, CWS, Kim RN, BSN Entered By: Gretta Cool, BSN, RN, CWS, Kim on 05/20/2020 15:37:15 Adam Merritt ([id number]) -------------------------------------------------------------------------------- Non-Wound Condition Assessment Details Patient Name: Adam Merritt Date of Service: 05/20/2020 2:30 PM Medical Record Number: [id number] Patient Account Number: [id number] Date of Birth/Sex: Oct 11, 1945 (74 y.o. M) Treating RN: Cornell Barman Primary Care Robt Okuda: [name] Other Clinician: Referring Malin Sambrano: [name] Treating Alter Moss/Extender: Jeri Cos [name]: 0 Non-Wound Condition: Condition: Lymphedema Location: Leg Side: Bilateral Notes Patient has two excoriated areas. Electronic Signature(s) Signed: 05/20/2020 5:16:15 PM By: Gretta Cool, BSN, RN, CWS, Kim RN, BSN Entered By: Gretta Cool, BSN, RN, CWS, Kim on 05/20/2020 14:58:56 Adam Merritt ([id number]) -------------------------------------------------------------------------------- Pain Assessment Details Patient Name: Adam Merritt Date of Service: 05/20/2020 2:30 PM Medical Record Number: [id number] Patient Account Number: [id number] Date of Birth/Sex: [birth year] (74 y.o. M) Treating RN: Cornell Barman Primary Care  Aspen Lawrance: [name] Other Clinician: Referring Jereme Loren:  [name] Treating Kaylin Schellenberg/Extender: Skipper Cliche in Treatment: 0 Active Problems Location of Pain Severity and Description of Pain Patient Has Paino No Site Locations Pain Management and Medication Current Pain Management: Electronic Signature(s) Signed: 05/20/2020 5:16:15 PM By: Gretta Cool, BSN, RN, CWS, Kim RN, BSN Entered By: Gretta Cool, BSN, RN, CWS, Kim on 05/20/2020 14:47:19 Adam Merritt ([id number]) -------------------------------------------------------------------------------- Patient/Caregiver Education Details Patient Name: Adam Merritt Date of Service: 05/20/2020 2:30 PM Medical Record Number: [id number] Patient Account Number: [id number] Date of Birth/Gender: [birth year] (74 y.o. M) Treating RN: Cornell Barman Primary Care Physician: [name] Other Clinician: Referring Physician: [name] Treating Physician/Extender: Skipper Cliche in Treatment: 0 Education Assessment Education Provided To: Patient Education Topics Provided Elevated Blood Sugar/ Impact on Healing: Handouts: Elevated Blood Sugars: How Do They Affect Wound Healing Methods: Demonstration, Explain/Verbal Responses: State content correctly Welcome To [facility]: Handouts: Welcome To [facility] Methods: Demonstration, Explain/Verbal Responses: State content correctly Wound/Skin Impairment: Handouts: Caring for Your Ulcer Methods: Demonstration, Explain/Verbal Responses: State content correctly Electronic Signature(s) Signed: 05/20/2020 5:16:15 PM By: Gretta Cool, BSN, RN, CWS, Kim RN, BSN Entered By: Gretta Cool, BSN, RN, CWS, Kim on 05/20/2020 16:02:14 Adam Merritt ([id number]) -------------------------------------------------------------------------------- Wound Assessment Details Patient Name: Adam Merritt Date of Service: 05/20/2020 2:30 PM Medical Record Number: [id number] Patient Account Number: [id number] Date of Birth/Sex: Apr 26, 1946 (74 y.o. M) Treating RN: Cornell Barman Primary Care Cooper Stamp: [name] Other Clinician: Referring Rennie Hack: [name] Treating Alin Hutchins/Extender: Jeri Cos [name]: 0 Wound Status Wound Number: 37 Primary Lymphedema Etiology: Wound Location: Left, Medial Lower Leg Secondary Diabetic Wound/Ulcer of the Lower Extremity Wounding Event: Gradually Appeared Etiology: Date Acquired: 05/03/2020 Wound Open Weeks Of Treatment: 0 Status: Clustered Wound: No Comorbid Anemia, Lymphedema, Chronic Obstructive Pulmonary History: Disease (COPD), Sleep Apnea, Congestive Heart Failure, Coronary Artery Disease, Hypertension, Peripheral Venous Disease, Type II Diabetes, Osteoarthritis, Neuropathy, Received Chemotherapy Wound Measurements Length: (cm) 0.5 Width: (cm) 0.5 Depth: (cm) 0.1 Area: (cm) 0.196 Volume: (cm) 0.02 % Reduction in Area: % Reduction in Volume: Epithelialization: Large (67-100%) Tunneling: No Undermining: No Wound Description Classification: Full Thickness Without Exposed Support Structu Wound Margin: Flat and Intact Exudate Amount: Large Exudate Type: Serous Exudate Color: amber res Foul Odor After Cleansing: No Slough/Fibrino No Wound Bed Granulation Amount: None Present (0%) Exposed Structure Necrotic Amount: None Present (0%) Fascia Exposed: No Fat Layer (Subcutaneous Tissue) Exposed: No Tendon Exposed: No Muscle Exposed: No Joint Exposed: No Bone Exposed: No Treatment Notes Wound #37 (Left, Medial Lower Leg) Notes Silver cell, abd pads, kerlix secured with velcro compression wraps Electronic Signature(s) Signed: 05/20/2020 5:16:15 PM By: Gretta Cool, BSN, RN, CWS, Kim RN, BSN Entered By: Gretta Cool, BSN, RN, CWS, Kim on 05/20/2020 15:09:15 Adam Merritt ([id number]) -------------------------------------------------------------------------------- Wound Assessment Details Patient Name: Adam Merritt Date of Service: 05/20/2020 2:30 PM Medical Record Number: [id number] Patient  Account Number: [id number] Date of Birth/Sex: 21-May-1946 (74 y.o. M) Treating RN: Cornell Barman Primary Care Adea Geisel: [name] Other Clinician: Referring Maybell Misenheimer: [name] Treating Halena Mohar/Extender: Jeri Cos [name]: 0 Wound Status Wound Number: 38 Primary Lymphedema Etiology: Wound Location: Left, Posterior Lower Leg Secondary Diabetic Wound/Ulcer of the Lower Extremity Wounding Event: Gradually Appeared Etiology: Date Acquired: 05/03/2020 Wound Open Weeks Of Treatment: 0 Status: Clustered Wound: No Comorbid Anemia, Lymphedema, Chronic Obstructive Pulmonary History: Disease (COPD), Sleep Apnea, Congestive Heart Failure, Coronary Artery Disease, Hypertension, Peripheral Venous Disease, Type II Diabetes, Osteoarthritis,  Neuropathy, Received Chemotherapy Photos Wound Measurements Length: (cm) 2 Width: (cm) 7 Depth: (cm) 0.1 Area: (cm) 10.996 Volume: (cm) 1.1 % Reduction in Area: 0% % Reduction in Volume: 0% Epithelialization: Large (67-100%) Tunneling: No Undermining: No Wound Description Classification: Full Thickness Without Exposed Support Structu Exudate Amount: Large Exudate Type: Serous Exudate Color: amber res Foul Odor After Cleansing: No Slough/Fibrino No Wound Bed Granulation Amount: Large (67-100%) Exposed Structure Necrotic Amount: None Present (0%) Fascia Exposed: No Fat Layer (Subcutaneous Tissue) Exposed: Yes Tendon Exposed: No Muscle Exposed: No Joint Exposed: No Bone Exposed: No Treatment Notes Wound #38 (Left, Posterior Lower Leg) Notes Silver cell, abd pads, kerlix secured with velcro compression wraps TARRIS, DELBENE ([id number]) Electronic Signature(s) Signed: 05/20/2020 3:59:32 PM By: Gretta Cool, BSN, RN, CWS, Kim RN, BSN Entered By: Gretta Cool, BSN, RN, CWS, Kim on 05/20/2020 15:59:31 Adam Merritt ([id number]) -------------------------------------------------------------------------------- [place] Details Patient Name:  Adam Merritt Date of Service: 05/20/2020 2:30 PM Medical Record Number: [id number] Patient Account Number: [id number] Date of Birth/Sex: 28-Jul-1945 (74 y.o. M) Treating RN: Cornell Barman Primary Care Lilton Pare: [name] Other Clinician: Referring Kaisy Severino: [name] Treating Brees Hounshell/Extender: Skipper Cliche in Treatment: 0 Vital Signs Time Taken: 14:47 Temperature (F): 98.2 Height (in): 66 Pulse (bpm): 66 Weight (lbs): 288 Respiratory Rate (breaths/min): 18 Body Mass Index (BMI): 46.5 Blood Pressure (mmHg): 153/83 Reference Range: 80 - 120 mg / dl Electronic Signature(s) Signed: 05/20/2020 5:16:15 PM By: Gretta Cool, BSN, RN, CWS, Kim RN, BSN Entered By: Gretta Cool, BSN, RN, CWS, Kim on 05/20/2020 14:48:46

## 2020-05-21 NOTE — Progress Notes (Signed)
DANYON, MCGINNESS ([id number]) Visit Report for 05/20/2020 Chief Complaint Document Details Patient Name: Adam Merritt, Adam Merritt Date of Service: 05/20/2020 2:30 PM Medical Record Number: [id number] Patient Account Number: [id number] Date of Birth/Sex: 03-02-1946 (74 y.o. M) Treating RN: Cornell Barman Primary Care Provider: [name] Other Clinician: Referring Provider: [name] Treating Provider/Extender: Jeri Cos [name]: 0 Information Obtained from: Patient Chief Complaint Left leg ulcers Electronic Signature(s) Signed: 05/20/2020 3:18:03 PM By: Worthy Keeler PA-C Entered By: Worthy Keeler on 05/20/2020 15:18:03 Adam Merritt ([id number]) -------------------------------------------------------------------------------- Debridement Details Patient Name: Adam Merritt Date of Service: 05/20/2020 2:30 PM Medical Record Number: [id number] Patient Account Number: [id number] Date of Birth/Sex: [birth year] (74 y.o. M) Treating RN: Cornell Barman Primary Care Provider: [name] Other Clinician: Referring Provider: [name] Treating Provider/Extender: Jeri Cos [name]: 0 Debridement Performed for Wound #37 Left,Medial Lower Leg Assessment: Performed By: Physician Tommie Sams., PA-C Debridement Type: Chemical/Enzymatic/Mechanical Agent Used: Saline and gauze Severity of Tissue Pre Debridement: Fat layer exposed Level of Consciousness (Pre- Awake and Alert procedure): Pre-procedure Verification/Time Out Yes - 15:00 Taken: Instrument: Other : saline and gauze Bleeding: None Response to Treatment: Procedure was tolerated well Level of Consciousness (Post- Awake and Alert procedure): Post Debridement Measurements of Total Wound Length: (cm) 1 Width: (cm) 1 Depth: (cm) 0.1 Volume: (cm) 0.079 Character of Wound/Ulcer Post Debridement: Stable Severity of Tissue Post Debridement: Fat layer exposed Post Procedure Diagnosis Same as  Pre-procedure Electronic Signature(s) Signed: 05/20/2020 4:55:27 PM By: Worthy Keeler PA-C Signed: 05/20/2020 5:16:15 PM By: Gretta Cool, BSN, RN, CWS, Kim RN, BSN Entered By: Gretta Cool, BSN, RN, CWS, Kim on 05/20/2020 15:38:42 Adam Merritt ([id number]) -------------------------------------------------------------------------------- Debridement Details Patient Name: Adam Merritt Date of Service: 05/20/2020 2:30 PM Medical Record Number: [id number] Patient Account Number: [id number] Date of Birth/Sex: 1945-07-22 (74 y.o. M) Treating RN: Cornell Barman Primary Care Provider: [name] Other Clinician: Referring Provider: [name] Treating Provider/Extender: Skipper Cliche in Treatment: 0 Debridement Performed for Wound #38 Left,Posterior Lower Leg Assessment: Performed By: Physician Tommie Sams., PA-C Debridement Type: Chemical/Enzymatic/Mechanical Agent Used: Saline and gauze Severity of Tissue Pre Debridement: Fat layer exposed Level of Consciousness (Pre- Awake and Alert procedure): Pre-procedure Verification/Time Out Yes - 15:00 Taken: Instrument: Other : saline and gauze Bleeding: None Response to Treatment: Procedure was tolerated well Level of Consciousness (Post- Awake and Alert procedure): Post Debridement Measurements of Total Wound Length: (cm) 2 Width: (cm) 7 Depth: (cm) 0.1 Volume: (cm) 1.1 Character of Wound/Ulcer Post Debridement: Stable Severity of Tissue Post Debridement: Fat layer exposed Post Procedure Diagnosis Same as Pre-procedure Electronic Signature(s) Signed: 05/20/2020 4:55:27 PM By: Worthy Keeler PA-C Signed: 05/20/2020 5:16:15 PM By: Gretta Cool, BSN, RN, CWS, Kim RN, BSN Entered By: Gretta Cool, BSN, RN, CWS, Kim on 05/20/2020 15:39:16 Adam Merritt ([id number]) -------------------------------------------------------------------------------- HPI Details Patient Name: Adam Merritt Date of Service: 05/20/2020 2:30 PM Medical Record Number:  [id number] Patient Account Number: [id number] Date of Birth/Sex: [birth year] (74 y.o. M) Treating RN: Cornell Barman Primary Care Provider: [name] Other Clinician: Referring Provider: [name] Treating Provider/Extender: Skipper Cliche in Treatment: 0 History of Present Illness HPI Description: 10/18/17-He is here for initial evaluation of bilateral lower extremity ulcerations in the presence of venous insufficiency and lymphedema. He has been seen by vascular medicine in the past, Dr. Lucky Cowboy, last seen in 2016. He does have a history of abnormal ABIs, which is to be expected given his lymphedema and venous  insufficiency. According to Epic, it appears that all attempts for arterial evaluation and/or angiography were not follow through with by patient. He does have a history of being seen in lymphedema clinic in 2018, stopped going approximately 6 months ago stating "it didn't do any good". He does not have lymphedema pumps, he does not have custom fit compression wrap/stockings. He is diabetic and his recent A1c last month was 7.6. He admits to chronic bilateral lower extremity pain, no change in pain since blister and ulceration development. He is currently being treated with Levaquin for bronchitis. He has home health and we will continue. 10/25/17-He is here in follow-up evaluation for bilateral lower extremity ulcerationssubtle he remains on Levaquin for bronchitis. Right lower extremity with no evidence of drainage or ulceration, persistent left lower extremity ulceration. He states that home health has not been out since his appointment. He went to [facility] on Tuesday, studies revealed: RIGHT ABI 0.9, TBI 0.6 LEFT ABI 1.1, TBI 0.6 with triphasic flow bilaterally. We will continue his same treatment plan. He has been educated on compression therapy and need for elevation. He will benefit from lymphedema pumps 11/01/17-He is here in follow-up evaluation for left lower  extremity ulcer. The right lower extremity remains healed. He has home health services, but they have not been out to see the patient for 2-3 weeks. He states it home health physical therapy changed his dressing yesterday after therapy; he placed Ace wrap compression. We are still waiting for lymphedema pumps, reordered d/t need for company change. 11/08/17-He is here in follow-up evaluation for left lower extremity ulcer. It is improved. Edema is significantly improved with compression therapy. We will continue with same treatment plan and he will follow-up next week. No word regarding lymphedema pumps 11/15/17-He is here in follow-up evaluation for left lower extremity ulcer. He is healed and will be discharged from wound care services. I have reached out to [facility] regarding his lymphedema pumps. They have been unable to reach the patient; the contact number they had with the patient's wife's cell phone and she has not answered any unrecognized calls. Contact should be made today, trial planned for next week; [facility] will continue to follow 11/27/17 on evaluation today patient has multiple blistered areas over the right lower extremity his left lower extremity appears to be doing okay. These blistered areas show signs of no infection which is great news. With that being said he did have some necrotic skin overlying which was mechanically debrided away with saline and gauze today without complication. Overall post debridement the wounds appear to be doing better but in general his swelling seems to be increased. This is obviously not good news. I think this is what has given rise to the blisters. 12/04/17 on evaluation today patient presents for follow-up concerning his bilateral lower extremity edema in the right lower extremity ulcers. He has been tolerating the dressing changes without complication. With that being said he has had no real issues with the wraps which is also  good news. Overall I'm pleased with the progress he's been making. 12/11/17 on evaluation today patient appears to be doing rather well in regard to his right lateral lower extremity ulcer. He's been tolerating the dressing changes without complication. Fortunately there does not appear to be any evidence of infection at this time. Overall I'm pleased with the progress that is being made. Unfortunately he has been in the hospital due to having what sounds to be a stomach virus/flu  fortunately that is starting to get better. 12/18/17 on evaluation today patient actually appears to be doing very well in regard to his bilateral lower extremities the swelling is under fairly good control his lymphedema pumps are still not up and running quite yet. With that being said he does have several areas of opening noted as far as wounds are concerned mainly over the left lower extremity. With that being said I do believe once he gets lymphedema pumps this would least help you mention some the fluid and preventing this from occurring. Hopefully that will be set up soon sleeves are Artie in place at his home he just waiting for the machine. 12/25/17 on evaluation today patient actually appears to be doing excellent in fact all of his ulcers appear to have resolved his legs appear very well. I do think he needs compression stockings we have discussed this and they are actually going to go to [place] today to elastic therapy to get this fitted for him. I think that is definitely a good thing to do. Readmission: 04/09/18 upon evaluation today this patient is seen for readmission due to bilateral lower extremity lymphedema. He has significant swelling of his extremities especially on the left although the right is also swollen he has weeping from both sides. There are no obvious open wounds at this point. Fortunately he has been doing fairly well for quite a bit of time since I last saw him. Nonetheless unfortunately this  seems to have reopened and is giving quite a bit of trouble. He states this began about a week ago when he first called [place] to get in to be seen. No fevers, chills, nausea, or vomiting noted at this time. He has not been using his lymphedema pumps due to the fact that they won't fit on his leg at this point likewise is also not been using his compression for essentially the same reason. 04/16/18 upon evaluation today patient actually appears to be doing a little better in regard to the fluid in his bilateral lower extremities. With that being said he's had three falls since I saw him last week. He also states that he's been feeling very poorly. I was concerned last week and feel like that the concern is still there as far as the congestion in his chest is concerned he seems to be breathing about the same as last week but again he states he's very weak he's not even able to walk further than from the chair to the door. His wife had to buy a wheelchair just to be able to get them out of the house to get to the appointment today. This has me very concerned. 04/23/18 on evaluation today patient actually appears to be doing much better than last week's evaluation. At that time actually had to transport him to the ER via EMS and he subsequently was admitted for acute pulmonary edema, acute renal failure, and acute congestive heart failure. Fortunately he is doing much better. Apparently they did dialyze him and were able to take off roughly 35 pounds of fluid. Nonetheless he is feeling much better both in regard to his breathing and he's able to get around much better at this time compared to previous. Overall I'm very DEKE, TILGHMAN ([id number]) happy with how things are at this time. There does not appear to be any evidence of infection currently. No fevers, chills, nausea, or vomiting noted at this time. 04/30/2018 patient seen today for follow-up and management of bilateral lower  extremity lymphedema.  He did express being more sad today than usual due to the recent loss of his dog. He states that he has been compliant with using the lymphedema pumps. However he does admit a minute over the last 2-3 days he has not been using the pumps due to the recent loss of his dog. At this time there is no drainage or open wounds to his lower extremities. The left leg edema is measuring smaller today. Still has a significant amount of edema on bilateral lower extremities With dry flaky skin. He will be referred to the lymphedema clinic for further management. Will continue 3 layer compression wraps and follow-up in 1-2 weeks.Denies any pain, fever, chairs recently. No recent falls or injuries reported during this visit. 05/07/18 on evaluation today patient actually appears to be doing very well in regard to his lower extremities in general all things considered. With that being said he is having some pain in the legs just due to the amount of swelling. He does have an area where he had a blister on the left lateral lower extremity this is open at this point other than that there's nothing else weeping at this time. 05/14/18 on evaluation today patient actually appears to be doing excellent all things considered in regard to his lower extremities. He still has a couple areas of weeping on each leg which has continued to be the issue for him. He does have an appointment with the lymphedema clinic although this isn't until February 2020. That was the earliest they had. In the meantime he has continued to tolerate the compression wraps without complication. 05/28/18 on evaluation today patient actually appears to be doing more poorly in regard to his left lower extremity where he has a wound open at this point. He also had a fall where he subsequently injured his right great toe which has led to an open wound at the site unfortunately. He has been tolerating the dressing changes without complication in general as  far as the wraps are concerned that he has not been putting any dressing on the left 1st toe ulcer site. 06/11/18 on evaluation today patient appears to be doing much worse in regard to his bilateral lower extremity ulcers. He has been tolerating the dressing changes without complication although his legs have not been wrapped more recently. Overall I am not very pleased with the way his legs appear. I do believe he needs to be back in compression wraps he still has not received his compression wraps from the [facility] hospital as of yet. 06/18/18 on evaluation today patient actually appears to be doing significantly better than last time I saw him. He has been tolerating the compression wraps without complication in the circumferential ulcers especially appear to be doing much better. His toe ulcer on the right in regard to the great toe is better although not as good as the legs in my pinion. No fevers chills noted 07/02/18 on evaluation today patient appears to be doing much better in regard to his lower extremity ulcers. Unfortunately since I last saw him he's had the distal portion of his right great toe if he dated it sounds as if this actually went downhill very quickly. I had only seen him a few days prior and the toe did not appear to be infected at that point subsequently became infected very rapidly and it was decided by the surgeon that the distal portion of the toe needed to be removed. The patient seems  to be doing well in this regard he tells me. With that being said his lower extremities are doing better from the standpoint of the wounds although he is significantly swollen at this point. 07/09/18 on evaluation today patient appears to be doing better in regard to the wounds on his lower extremities. In fact everything is almost completely healed he is just a small area on the left posterior lower extremity that is open at this point. 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 With that being said he needs some kind of compression it does sound like he may not be wearing his compression, that is the wraps, during the entire time between when he's here visit to visit. Apparently his wife took the current one off because it began to "fall apart". 07/16/18 on evaluation today patient appears to be extremely swollen especially in regard to his left lower extremity unfortunately. He also has a new skin tear over the left lower extremity and there's a smaller area on the right lower extremity as well. Unfortunately this seems to be due in part to blistering and fluid buildup in his leg. He did get the reduction wraps that were ordered by the [facility] hospital for him to go to lymphedema clinic. With that being said his wounds on the legs have not healed to the point to where they would likely accept them as a patient lymphedema clinic currently. We need to try to get this to heal. With that being said he's been taking his wraps off which is not doing him any favors at this point. In fact this is probably quite counterproductive compared to what needs to occur. We will likely need to increase to a four layer compression wrap and continue to also utilize elevation and he has to keep the wraps on not take them off as he's been doing currently hasn't had a wrap on since Saturday. 07/23/18 on evaluation today patient appears to be doing much better in regard to his bilateral lower extremities. In fact his left lower extremity which was the largest is actually 15 cm smaller today compared to what it was last time he was here in our clinic. This is obviously good news after just one week. Nonetheless the differences he actually kept the wraps on during the entire week this time. That's not typical for him. I do believe he  understands a little bit better now the severity of the situation and why it's important for him to keep these wraps on. 07/30/18 on evaluation today patient actually appears to be doing rather well in regard to his lower extremities. His legs are much smaller than they have been in the past and he actually has only one very small rudder superficial region remaining that is not closed on the left lateral/posterior lower extremity even this is almost completely close. I do believe likely next week he will be healed without any complications. I do think we need to continue the wraps however this seems to be beneficial for him. I also think it may be a good time for [place] to go ahead and see about getting the appointment with the lymphedema clinic which is supposed to be made for him in order to keep this moving along and hopefully get them into compression wraps that will in the end help  him to remain healed. 08/06/18 on evaluation today patient actually appears to be doing very well in regard as bilateral lower extremities. In fact his wounds appear to be completely healed at this time. 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 He is in the process of getting appointment with the lymphedema clinic we have made this referral were just waiting to hear back on the schedule time. We need to follow up on that today as well. 08/13/18 on evaluation today patient actually appears to be doing very well in regard to his bilateral lower extremities there are no open wounds at this point. We are gonna go ahead and see about ordering the Velcro compression wraps for him had a discussion with them about [place] doing it versus the [place] they feel like they can definitely afford going ahead and get the wraps themselves and they would prefer to try to avoid having to go to the lymphedema clinic if it all possible which I completely understand. As long as he has good  compression I'm okay either way. 3/17//20 on evaluation today patient actually appears to be doing well in regard to his bilateral lower extremity ulcers. He has been tolerating the dressing changes without complication specifically the compression wraps. Overall is had no issues my fingers finding that I see at this point is that he is having trouble with constipation. He tells me he has not been able to go to the bathroom for about six days. He's taken to over-the- counter oral laxatives unfortunately this is not helping. He has not contacted his doctor. AAIDYN, SAN ([id number]) 08/27/18 on evaluation today patient appears to be doing fairly well in regard to his lower extremities at this point. There does not appear to be any new altars is swelling is very well controlled. We are still waiting for his Velcro compression wraps to arrive that should be sometime in the next week is Artie sent the check for these. 09/03/18 on evaluation today patient appears to be doing excellent in regard to his bilateral lower extremities which he shows no signs of wound openings in regard to this point. He does have his Velcro compression wraps which did arrive in the mail since I saw him last week. Overall he is doing excellent in my pinion. 09/13/18 on evaluation today patient appears to be doing very well currently in regard to the overall appearance of his bilateral lower extremities although he's a little bit more swollen than last time we saw him. At that point he been discharged without any open wounds. Nonetheless he has a small open wound on the posterior left lower extremity with some evidence of cellulitis noted as well. Fortunately I feel like he has made good progress overall with regard to his lower extremities from were things used to be. 09/20/18 on evaluation today patient actually appears to be doing much better. The erythematous lower extremity is improving wound itself which is still open  appears to be doing much better as far as but appearance as well as pain is concerned overall very pleased in this regard. There's no signs of active infection at this time. 09/27/18 on evaluation today patient's wounds on the lower extremity actually appear to be doing fairly well at this time which is good news. There is no evidence of active infection currently and again is just as left lower extremity were there any wounds at this point anyway. I believe they may be completely  healed but again I'm not 100% sure based on evaluation today. I think one more week of observation would likely be a good idea. 10/04/18 on evaluation today patient actually appears to be doing excellent in regard to the left lower extremity which actually appears to be completely healed as of today. Unfortunately he's been having issues with his right lower extremity have a new wound that has opened. Fortunately there's no evidence of active infection at this time which is good news. 10/10/18 upon evaluation today patient actually appears to be doing a little bit worse with the new open area on his right posterior lower extremity. He's been tolerating the dressing changes without complication. Right now we been using his compression wraps although I think we may need to switch back to actually performing bilateral compression wraps are in the clinic. No fevers, chills, nausea, or vomiting noted at this time. 10/17/18 on evaluation today patient actually appears to be doing quite well in regard to his bilateral lower extremity ulcers. He has been tolerating the reps without complication although he would prefer not to rewrap his legs as of today. Fortunately there's no signs of active infection which is good news. No fevers, chills, nausea, or vomiting noted at this time. 10/24/18 on evaluation today patient appears to be doing very well in regard to his lower extremities. His right lower extremity is shown signs of healing and  his left lower [place] though not healed appears to be improving which is excellent news. Overall very pleased with how things seem to be progressing at this time. The patient is likewise happy to hear this. His Velcro compression wraps however have not been put on properly will gonna show his wife how to do that properly today. 10/31/18 on evaluation today patient appears to be doing more poorly in regard to his left lower extremity in particular although both lower extremities actually are showing some signs of being worse than my previous evaluation. Unfortunately I'm just not sure that his compression stockings even with the use of the compression/lymphedema pumps seem to be controlling this well. Upon further questioning he tells me that he also is not able to lie flat in the bed due to his congestive heart failure and difficulty breathing. For that reason he sleeps in his recliner. To make matters worse his recliner also cannot even hold his legs up so instead of even being somewhat elevated they pretty much hang down to the floor. This is the way he sleeps each night which is definitely counterproductive to everything else that were attempting to do from the standpoint of controlling his fluid. Nonetheless I think that he potentially could benefit from a hospital bed although this would be something that his primary care provider would likely have to order since anything that is order on our side has to be directly related to wound care and again the hospital bed is not necessarily a direct relation to although I think it does contribute to his overall wound status on his lower extremities. 11/07/18 on evaluation today patient appears to be doing better in regard to his bilateral lower extremity. He's been tolerating the dressing changes without complication. We did in the interim since I last saw him switch to just using extras orbiting new alginate and that seems to have done much better for him.  I'm very pleased with the overall progress that is made. 11/14/18 on evaluation today patient appears to be redoing rather well in regard to his left lower  extremity ulcers which are the only ones remaining at this point. Fortunately there's no signs of active infection at this time which is good news. Overall been very pleased with how things seem to be progressing currently. No fevers, chills, nausea, or vomiting noted at this time. 11/20/17 on evaluation today patient actually appears to be doing quite well in regard to his lower extremities on the left on the right he has several blisters that showed up although there's some question about whether or not he has had his broker compression wraps on like he was supposed to or not. Fortunately there's no signs of active infection at this time which is good news. Unfortunately though he is doing well from the standpoint of the left leg the right leg is not doing as well again this is when we did not wrap last week. 11/28/18 on evaluation today patient appears to be doing well in regard to his bilateral lower extremities is left appears to be healed is right is not healed but is very close to being so. Overall very pleased with how things seem to be progressing. Patient is likewise happy that things are doing well. 12/09/18 on evaluation today patient actually appears to be completely healed which is excellent news. He actually seems to be doing well in regard to the swelling of the bilateral lower extremities which is also great news. Overall very pleased with how things seem to be progressing. Readmission: 03/18/2019 upon evaluation today patient presents for reevaluation concerning issues that he is having with his left lower extremity where he does have a wound noted upon inspection today. He also has a wound on the right second toe on the tip where it is apparently been rubbing in his shoe I did look at issues and you can actually see where this has  been occurring as well. Fortunately there is no signs of infection with regard to the toe necessarily although it is very swollen compared to normal that does have me somewhat concerned about the possibility of further evaluation for infection/osteomyelitis. I would recommend an x-ray to start with. Otherwise he states that it is been 1-2 weeks that he has had the draining in regard to left lower extremity he does not know how this happened he has been wearing his compression appropriately and I do see that as well today but nonetheless I do think that he needs to continue to be very cautious with regard to elevation as well. LADAMIEN, RAMMEL ([id number]) 03/25/2019 on evaluation today patient appears to be doing much better in regard to his lower extremity at this time. That is on the left. He did culture positive for Pseudomonas but the good news is he seems to be doing much better the gentamicin we applied topically seems to have done a great job for him. There is no signs of active infection at this time systemically and locally he is doing much better. No fevers, chills, nausea, vomiting, or diarrhea. In regard to his right toe this seems to be doing much better there is very little pressure noted at this point I did clean up some of the callus today around the edges of the wound as well as the surface of the wound but to be honest he is progressing quite nicely. 10/30; the area on the left anterior lower tibia area is healed over. His edema control is adequate even though his use of the compression pumps seems very intermittent. He has a juxta lite stocking on the  right leg and a think there is 1 for the left leg at home. His wife is putting these on. He has an area on the plantar right second toe which is a hammertoe they are trying to offload this 04/08/2019 on evaluation today patient appears to be doing well in regard to his lower extremities which is good news. We are seeing him today for his  toe ulcer which is giving him a little bit more trouble but again still seems to be doing better at this point which is good news. He is going require some sharp debridement in regard to the toe today however. 04/15/2019 on evaluation today patient actually appears to be doing quite well with regard to his toe ulcer. I am very pleased with how things are doing in that regard. With regard to his lower extremity edema in general he tells me he is not been using the lymphedema pumps regularly although he does not use them. I think that he needs to use them more regularly he does have a small blister on the left lower extremity this is not open at this point but obviously it something that can get worse if he does not keep the compression going and the pumps going as well. 04/22/2019 on evaluation today patient appears to be doing well with regard to his toe ulcer. He has been tolerating the dressing changes without complication. This is measuring slightly smaller this week as compared to last week. Fortunately there is no signs of active infection at this time. 05/06/2019 on evaluation today patient actually appears to be doing excellent. In fact his toe appears to be completely healed which is great news. There is no signs of active infection at this time. Readmission: Patient presents today for follow-up after having been discharged at the beginning of December 2020. He states that in recent weeks things have reopened and has been having more trouble at this point. With that being said fortunately there is no signs of active infection at this time. No fever chills noted. Nonetheless he also does have an issue with one of his toes as well that seems to be somewhat this is on the right foot second toe. 07/25/2019 upon evaluation today patient's lower extremities appear to be doing excellent bilaterally. I really feel like he is showing signs of great improvement and in fact I think he is close to healing  which is great news. There is no signs of active infection at this time. No fevers, chills, nausea, vomiting, or diarrhea. 07/31/2019 upon evaluation today patient appears to be doing excellent and in fact I think may be completely healed in regard to his right lower extremity that were still to monitor this 1 more week before closing it out. The left lower extremity is measuring smaller although not completely closed seems to be doing excellent. 08/07/2019 upon evaluation today patient appears to be doing excellent in regard to his wounds. In fact the right lower extremity is completely healed there is no issues here. The left lower extremity has 1 very small area still remaining fortunately there is no signs of infection and overall I think he is very close to closure of the here as well. 08/14/19 upon evaluation today patient appears to be doing excellent in regard to his lower extremities. In fact he appears to be completely healed as of today. Fortunately there's no signs of active infection at this time. No fevers, chills, nausea, or vomiting noted at this time. READMISSION 09/26/2019 This  is a 74 year old man who is well-known to this clinic having just been discharged on 08/14/2019. He has known lymphedema and chronic venous insufficiency. He has Farrow wrap stockings and also external compression pumps. He does not use the external compression pumps however. He does however apparently fairly faithfully use the stockings. They have not been lotion in his legs. He has developed new wounds on the right anterior as well as the left medial left lateral and left posterior calf. He returns for our review of this Past medical history includes coronary artery disease, hypertension, congestive heart failure, COPD, venous insufficiency with lymphedema, type 2 diabetes. He apparently has compression pumps, Farrow wraps and at 1 point a hospital bed although I believe he sleeps in a recliner 10/03/2019 upon  evaluation today patient appears to be doing better with regard to his wounds in general. He has been tolerating the dressing changes without complication. Fortunately there is no signs of active infection. No fevers, chills, nausea, vomiting, or diarrhea. I do believe the compression wraps are helping as they always have in the past. 10/10/2019 upon evaluation today patient appears to be doing excellent at this point. His right lower extremity is measuring much better and in fact is completely healed. His left lower extremity is also measuring better though not healed seems to be doing excellent at this point which is great news. Overall I am very pleased with how things appear currently. 10/27/2019 upon evaluation today patient appears to be doing quite well with regard to his wounds currently. He has been tolerating the dressing changes without complication. Fortunately there is no signs of active infection at this time. In fact he is almost completely healed on the left and has just a very small area open and on the right he is completely healed. 11/04/2019 upon evaluation today patient appears to be doing quite well with regard to his wounds. In fact he appears to be quite possibly completely healed on the left lower extremity now as well the right lower extremity is still doing well. With that being said unfortunately though this may be healed I think it is a very fragile area and I cannot even confirm there is not a very small opening still remaining. I really think he may benefit from 1 additional weeks wrapping before discontinuing. 11/10/2019 upon evaluation today patient appears to be doing well in regard to his original wounds in fact everything that we were treating last week is completely healed on the left. Unfortunately he has a new skin tear just above where the wrap slipped down due to a fall he sustained causing this injury. 11/17/2019 on evaluation today patient appears to be doing better  with regard to his wound. He has been tolerating the dressing changes without RENELL, ALLUM. ([id number]) complication. Fortunately there is no signs of active infection at this time. No fevers, chills, nausea, vomiting, or diarrhea. 11/24/2019 upon evaluation today patient appears to be doing well with regard to his wound. This is making good progress there does not appear to be signs of active infection but overall I do feel like he is headed in the correct direction. Overall he is tolerating the compression wrap as well. 12/02/2019 upon evaluation today patient appears to be doing well with regard to his leg ulcer. In fact this appears to be completely healed and this is the last of the wounds that he had but still the left anterior lower extremity. Fortunately there is no signs of active infection at  this time. No fevers, chills, nausea, vomiting, or diarrhea. Readmission: 02/05/2020 on evaluation today patient appears to be doing well with regard to his wound all things considered. He actually tells me that a couple weeks ago he had trouble with his wraps and therefore took him off for a couple of days in order to give his legs a break. It was during this time that he actually developed increased swelling and edema of his legs and blisters on both legs. That is when he called to make the appointment. Nonetheless since that time he began wearing his compression wraps which are Velcro in the meantime and has done extremely well with this. In fact his leg appears to be under good control as far as edema is concerned today it is nothing like what I am seeing when he has come in for evaluations in the past. They have been using some of the silver alginate at home which has been beneficial for him. Fortunately there is no signs of active infection at this time. No fevers, chills, nausea, vomiting, or diarrhea. 02/19/2020 on evaluation today patient unfortunately does not appear to be doing quite as well as  I would like to see. He has no signs of active infection at this time but unfortunately he is not doing well in regard to the overall appearance of his left leg. He has a couple other areas that are weeping now and the leg is much more swollen. I think that he needs to actually have a compression wrap to try to manage this. 02/26/2020 on evaluation today patient appears to be doing well with regard to his left lower extremity ulcer. Fortunately the swelling is down I do believe the pressure wrap seems to be doing quite well. Fortunately there is no signs of active infection at this time. 03/05/2020 upon evaluation today patient appears to be doing excellent in regard to his legs at this point. Fortunately there is no signs of active infection which is great news. Overall he feels like he is completely healed which is great news. Readmission: 05/20/2020 upon evaluation today patient appears to be doing well with regard to his leg ulcer all things considered. He is actually coming back to see [place] after having had a reopening of the ulcers on his left leg in the past several weeks. He is out of dressing supplies and his wife is been try to take care of this as best she can. Electronic Signature(s) Signed: 05/20/2020 3:20:31 PM By: Worthy Keeler PA-C Entered By: Worthy Keeler on 05/20/2020 15:20:31 Adam Merritt ([id number]) -------------------------------------------------------------------------------- Physical Exam Details Patient Name: Adam Merritt Date of Service: 05/20/2020 2:30 PM Medical Record Number: [id number] Patient Account Number: [id number] Date of Birth/Sex: [birth year] (74 y.o. M) Treating RN: Cornell Barman Primary Care Provider: [name] Other Clinician: Referring Provider: [name] Treating Provider/Extender: Jeri Cos [name]: 0 Constitutional patient is hypertensive.. pulse regular and within target range for patient.Marland Kitchen respirations regular, non-labored  and within target range for patient.Marland Kitchen temperature within target range for patient.. Well-nourished and well-hydrated in no acute distress. Eyes conjunctiva clear no eyelid edema noted. pupils equal round and reactive to light and accommodation. Respiratory normal breathing without difficulty. Cardiovascular 2+ dorsalis pedis/posterior tibialis pulses. 1+ pitting edema of the bilateral lower extremities. Musculoskeletal Patient unable to walk without assistance. no significant deformity or arthritic changes, no loss or range of motion, no clubbing. Psychiatric this patient is able to make decisions and demonstrates good insight into  disease process. Alert and Oriented x 3. pleasant and cooperative. Notes Upon inspection patient's wound bed actually showed signs of having some slough on the surface of the wound as well as some dry skin around I did mechanically debride this way with saline and gauze and the patient tolerated that today without complication fortunately the wound does not appear to be big nor deep which is good news. Nonetheless I do believe that he does need some intervention here to try to help with getting this to heal I think getting him some supplies would be beneficial for him. Electronic Signature(s) Signed: 05/20/2020 3:21:22 PM By: Worthy Keeler PA-C Entered By: Worthy Keeler on 05/20/2020 15:21:21 Adam Merritt ([id number]) -------------------------------------------------------------------------------- Physician Orders Details Patient Name: Adam Merritt Date of Service: 05/20/2020 2:30 PM Medical Record Number: [id number] Patient Account Number: [id number] Date of Birth/Sex: Dec 16, 1945 (74 y.o. M) Treating RN: Cornell Barman Primary Care Provider: [name] Other Clinician: Referring Provider: [name] Treating Provider/Extender: Skipper Cliche in Treatment: 0 Verbal / Phone Orders: No Diagnosis Coding ICD-10 Coding Code Description I89.0  Lymphedema, not elsewhere classified I87.2 Venous insufficiency (chronic) (peripheral) L97.822 Non-pressure chronic ulcer of other part of left lower leg with fat layer exposed Skin Barriers/Peri-Wound Care o Moisturizing lotion Primary Wound Dressing Wound #37 Left,Medial Lower Leg o Silver Alginate Wound #38 Left,Posterior Lower Leg o Silver Alginate Secondary Dressing Wound #37 Left,Medial Lower Leg o ABD and Kerlix/Conform Wound #38 Left,Posterior Lower Leg o ABD and Kerlix/Conform Dressing Change Frequency Wound #37 Left,Medial Lower Leg o Change dressing every day. Wound #38 Left,Posterior Lower Leg o Change dressing every day. Follow-up Appointments Wound #37 Left,Medial Lower Leg o Return Appointment in 3 weeks. Wound #38 Left,Posterior Lower Leg o Return Appointment in 3 weeks. Edema Control Wound #37 Left,Medial Lower Leg o Patient to wear own Velcro compression garment. Wound #38 Left,Posterior Lower Leg o Patient to wear own Velcro compression garment. Electronic Signature(s) Signed: 05/20/2020 4:55:27 PM By: Worthy Keeler PA-C Signed: 05/20/2020 5:16:15 PM By: Gretta Cool, BSN, RN, CWS, Kim RN, BSN Entered By: Gretta Cool, BSN, RN, CWS, Kim on 05/20/2020 15:40:35 Adam Merritt ([id number]) -------------------------------------------------------------------------------- Problem List Details Patient Name: SEBERT, STOLLINGS Date of Service: 05/20/2020 2:30 PM Medical Record Number: [id number] Patient Account Number: [id number] Date of Birth/Sex: 1946-01-11 (74 y.o. M) Treating RN: Cornell Barman Primary Care Provider: [name] Other Clinician: Referring Provider: [name] Treating Provider/Extender: Skipper Cliche in Treatment: 0 Active Problems ICD-10 Encounter Code Description Active Date MDM Diagnosis I89.0 Lymphedema, not elsewhere classified 05/20/2020 No Yes I87.2 Venous insufficiency (chronic) (peripheral) 05/20/2020 No Yes L97.822  Non-pressure chronic ulcer of other part of left lower leg with fat layer 05/20/2020 No Yes exposed Inactive Problems Resolved Problems Electronic Signature(s) Signed: 05/20/2020 3:05:35 PM By: Worthy Keeler PA-C Entered By: Worthy Keeler on 05/20/2020 15:05:34 Adam Merritt ([id number]) -------------------------------------------------------------------------------- Progress Note Details Patient Name: Adam Merritt Date of Service: 05/20/2020 2:30 PM Medical Record Number: [id number] Patient Account Number: [id number] Date of Birth/Sex: 03-13-1946 (74 y.o. M) Treating RN: Cornell Barman Primary Care Provider: [name] Other Clinician: Referring Provider: [name] Treating Provider/Extender: Skipper Cliche in Treatment: 0 Subjective Chief Complaint Information obtained from Patient Left leg ulcers History of Present Illness (HPI) 10/18/17-He is here for initial evaluation of bilateral lower extremity ulcerations in the presence of venous insufficiency and lymphedema. He has been seen by vascular medicine in the past, Dr. Lucky Cowboy, last seen in 2016. He does  have a history of abnormal ABIs, which is to be expected given his lymphedema and venous insufficiency. According to Epic, it appears that all attempts for arterial evaluation and/or angiography were not follow through with by patient. He does have a history of being seen in lymphedema clinic in 2018, stopped going approximately 6 months ago stating "it didn't do any good". He does not have lymphedema pumps, he does not have custom fit compression wrap/stockings. He is diabetic and his recent A1c last month was 7.6. He admits to chronic bilateral lower extremity pain, no change in pain since blister and ulceration development. He is currently being treated with Levaquin for bronchitis. He has home health and we will continue. 10/25/17-He is here in follow-up evaluation for bilateral lower extremity ulcerationssubtle he remains on  Levaquin for bronchitis. Right lower extremity with no evidence of drainage or ulceration, persistent left lower extremity ulceration. He states that home health has not been out since his appointment. He went to [facility] on Tuesday, studies revealed: RIGHT ABI 0.9, TBI 0.6 LEFT ABI 1.1, TBI 0.6 with triphasic flow bilaterally. We will continue his same treatment plan. He has been educated on compression therapy and need for elevation. He will benefit from lymphedema pumps 11/01/17-He is here in follow-up evaluation for left lower extremity ulcer. The right lower extremity remains healed. He has home health services, but they have not been out to see the patient for 2-3 weeks. He states it home health physical therapy changed his dressing yesterday after therapy; he placed Ace wrap compression. We are still waiting for lymphedema pumps, reordered d/t need for company change. 11/08/17-He is here in follow-up evaluation for left lower extremity ulcer. It is improved. Edema is significantly improved with compression therapy. We will continue with same treatment plan and he will follow-up next week. No word regarding lymphedema pumps 11/15/17-He is here in follow-up evaluation for left lower extremity ulcer. He is healed and will be discharged from wound care services. I have reached out to [facility] regarding his lymphedema pumps. They have been unable to reach the patient; the contact number they had with the patient's wife's cell phone and she has not answered any unrecognized calls. Contact should be made today, trial planned for next week; [facility] will continue to follow 11/27/17 on evaluation today patient has multiple blistered areas over the right lower extremity his left lower extremity appears to be doing okay. These blistered areas show signs of no infection which is great news. With that being said he did have some necrotic skin overlying which was mechanically  debrided away with saline and gauze today without complication. Overall post debridement the wounds appear to be doing better but in general his swelling seems to be increased. This is obviously not good news. I think this is what has given rise to the blisters. 12/04/17 on evaluation today patient presents for follow-up concerning his bilateral lower extremity edema in the right lower extremity ulcers. He has been tolerating the dressing changes without complication. With that being said he has had no real issues with the wraps which is also good news. Overall I'm pleased with the progress he's been making. 12/11/17 on evaluation today patient appears to be doing rather well in regard to his right lateral lower extremity ulcer. He's been tolerating the dressing changes without complication. Fortunately there does not appear to be any evidence of infection at this time. Overall I'm pleased with the progress that is being made. Unfortunately  he has been in the hospital due to having what sounds to be a stomach virus/flu fortunately that is starting to get better. 12/18/17 on evaluation today patient actually appears to be doing very well in regard to his bilateral lower extremities the swelling is under fairly good control his lymphedema pumps are still not up and running quite yet. With that being said he does have several areas of opening noted as far as wounds are concerned mainly over the left lower extremity. With that being said I do believe once he gets lymphedema pumps this would least help you mention some the fluid and preventing this from occurring. Hopefully that will be set up soon sleeves are Artie in place at his home he just waiting for the machine. 12/25/17 on evaluation today patient actually appears to be doing excellent in fact all of his ulcers appear to have resolved his legs appear very well. I do think he needs compression stockings we have discussed this and they are actually going  to go to [place] today to elastic therapy to get this fitted for him. I think that is definitely a good thing to do. Readmission: 04/09/18 upon evaluation today this patient is seen for readmission due to bilateral lower extremity lymphedema. He has significant swelling of his extremities especially on the left although the right is also swollen he has weeping from both sides. There are no obvious open wounds at this point. Fortunately he has been doing fairly well for quite a bit of time since I last saw him. Nonetheless unfortunately this seems to have reopened and is giving quite a bit of trouble. He states this began about a week ago when he first called [place] to get in to be seen. No fevers, chills, nausea, or vomiting noted at this time. He has not been using his lymphedema pumps due to the fact that they won't fit on his leg at this point likewise is also not been using his compression for essentially the same reason. 04/16/18 upon evaluation today patient actually appears to be doing a little better in regard to the fluid in his bilateral lower extremities. With that being said he's had three falls since I saw him last week. He also states that he's been feeling very poorly. I was concerned last week and feel like that the concern is still there as far as the congestion in his chest is concerned he seems to be breathing about the same as last week but again he states he's very weak he's not even able to walk further than from the chair to the door. His wife had to buy a wheelchair just to be able to get them out of the house to get to the appointment today. This has me very concerned. ISLAM, EICHINGER ([id number]) 04/23/18 on evaluation today patient actually appears to be doing much better than last week's evaluation. At that time actually had to transport him to the ER via EMS and he subsequently was admitted for acute pulmonary edema, acute renal failure, and acute congestive heart  failure. Fortunately he is doing much better. Apparently they did dialyze him and were able to take off roughly 35 pounds of fluid. Nonetheless he is feeling much better both in regard to his breathing and he's able to get around much better at this time compared to previous. Overall I'm very happy with how things are at this time. There does not appear to be any evidence of infection currently. No fevers, chills, nausea,  or vomiting noted at this time. 04/30/2018 patient seen today for follow-up and management of bilateral lower extremity lymphedema. He did express being more sad today than usual due to the recent loss of his dog. He states that he has been compliant with using the lymphedema pumps. However he does admit a minute over the last 2-3 days he has not been using the pumps due to the recent loss of his dog. At this time there is no drainage or open wounds to his lower extremities. The left leg edema is measuring smaller today. Still has a significant amount of edema on bilateral lower extremities With dry flaky skin. He will be referred to the lymphedema clinic for further management. Will continue 3 layer compression wraps and follow-up in 1-2 weeks.Denies any pain, fever, chairs recently. No recent falls or injuries reported during this visit. 05/07/18 on evaluation today patient actually appears to be doing very well in regard to his lower extremities in general all things considered. With that being said he is having some pain in the legs just due to the amount of swelling. He does have an area where he had a blister on the left lateral lower extremity this is open at this point other than that there's nothing else weeping at this time. 05/14/18 on evaluation today patient actually appears to be doing excellent all things considered in regard to his lower extremities. He still has a couple areas of weeping on each leg which has continued to be the issue for him. He does have an  appointment with the lymphedema clinic although this isn't until February 2020. That was the earliest they had. In the meantime he has continued to tolerate the compression wraps without complication. 05/28/18 on evaluation today patient actually appears to be doing more poorly in regard to his left lower extremity where he has a wound open at this point. He also had a fall where he subsequently injured his right great toe which has led to an open wound at the site unfortunately. He has been tolerating the dressing changes without complication in general as far as the wraps are concerned that he has not been putting any dressing on the left 1st toe ulcer site. 06/11/18 on evaluation today patient appears to be doing much worse in regard to his bilateral lower extremity ulcers. He has been tolerating the dressing changes without complication although his legs have not been wrapped more recently. Overall I am not very pleased with the way his legs appear. I do believe he needs to be back in compression wraps he still has not received his compression wraps from the [facility] hospital as of yet. 06/18/18 on evaluation today patient actually appears to be doing significantly better than last time I saw him. He has been tolerating the compression wraps without complication in the circumferential ulcers especially appear to be doing much better. His toe ulcer on the right in regard to the great toe is better although not as good as the legs in my pinion. No fevers chills noted 07/02/18 on evaluation today patient appears to be doing much better in regard to his lower extremity ulcers. Unfortunately since I last saw him he's had the distal portion of his right great toe if he dated it sounds as if this actually went downhill very quickly. I had only seen him a few days prior and the toe did not appear to be infected at that point subsequently became infected very rapidly and it was decided by  the surgeon that  the distal portion of the toe needed to be removed. The patient seems to be doing well in this regard he tells me. With that being said his lower extremities are doing better from the standpoint of the wounds although he is significantly swollen at this point. 07/09/18 on evaluation today patient appears to be doing better in regard to the wounds on his lower extremities. In fact everything is almost completely healed he is just a small area on the left posterior lower extremity that is open at this point. 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 With that being said he needs some kind of compression it does sound like he may not be wearing his compression, that is the wraps, during the entire time between when he's here visit to visit. Apparently his wife took the current one off because it began to "fall apart". 07/16/18 on evaluation today patient appears to be extremely swollen especially in regard to his left lower extremity unfortunately. He also has a new skin tear over the left lower extremity and there's a smaller area on the right lower extremity as well. Unfortunately this seems to be due in part to blistering and fluid buildup in his leg. He did get the reduction wraps that were ordered by the [facility] hospital for him to go to lymphedema clinic. With that being said his wounds on the legs have not healed to the point to where they would likely accept them as a patient lymphedema clinic currently. We need to try to get this to heal. With that being said he's been taking his wraps off which is not doing him any favors at this point. In fact this is probably quite counterproductive compared to what needs to occur. We will likely need to increase to a four layer compression wrap and continue to also utilize elevation  and he has to keep the wraps on not take them off as he's been doing currently hasn't had a wrap on since Saturday. 07/23/18 on evaluation today patient appears to be doing much better in regard to his bilateral lower extremities. In fact his left lower extremity which was the largest is actually 15 cm smaller today compared to what it was last time he was here in our clinic. This is obviously good news after just one week. Nonetheless the differences he actually kept the wraps on during the entire week this time. That's not typical for him. I do believe he understands a little bit better now the severity of the situation and why it's important for him to keep these wraps on. 07/30/18 on evaluation today patient actually appears to be doing rather well in regard to his lower extremities. His legs are much smaller than they have been in the past and he actually has only one very small rudder superficial region remaining that is not closed on the left lateral/posterior lower extremity even this is almost completely close. I do believe likely next week he will be healed without any complications. I do think we need to continue the wraps however this seems to be beneficial for him. I also think it may be a good time for [place] to go ahead and see about getting the appointment with the lymphedema clinic which is supposed to be made for him in order to keep  this moving along and hopefully get them into compression wraps that will in the end help him to remain healed. 08/06/18 on evaluation today patient actually appears to be doing very well in regard as bilateral lower extremities. In fact his wounds appear to be completely healed at this time. 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 He is in the process of getting appointment with the lymphedema clinic we have made this referral were just waiting to hear back on the schedule time. We need to follow up  on that today as well. 08/13/18 on evaluation today patient actually appears to be doing very well in regard to his bilateral lower extremities there are no open wounds at this point. We are gonna go ahead and see about ordering the Velcro compression wraps for him had a discussion with them about [place] doing it versus the [place] they feel like they can definitely afford going ahead and get the wraps themselves and they would prefer to try to avoid having to go to the lymphedema clinic if it all possible which I completely understand. As long as he has good compression I'm okay either way. CARMERON, HEADY ([id number]) 3/17//20 on evaluation today patient actually appears to be doing well in regard to his bilateral lower extremity ulcers. He has been tolerating the dressing changes without complication specifically the compression wraps. Overall is had no issues my fingers finding that I see at this point is that he is having trouble with constipation. He tells me he has not been able to go to the bathroom for about six days. He's taken to over-the- counter oral laxatives unfortunately this is not helping. He has not contacted his doctor. 08/27/18 on evaluation today patient appears to be doing fairly well in regard to his lower extremities at this point. There does not appear to be any new altars is swelling is very well controlled. We are still waiting for his Velcro compression wraps to arrive that should be sometime in the next week is Artie sent the check for these. 09/03/18 on evaluation today patient appears to be doing excellent in regard to his bilateral lower extremities which he shows no signs of wound openings in regard to this point. He does have his Velcro compression wraps which did arrive in the mail since I saw him last week. Overall he is doing excellent in my pinion. 09/13/18 on evaluation today patient appears to be doing very well currently in regard to the overall appearance of his bilateral  lower extremities although he's a little bit more swollen than last time we saw him. At that point he been discharged without any open wounds. Nonetheless he has a small open wound on the posterior left lower extremity with some evidence of cellulitis noted as well. Fortunately I feel like he has made good progress overall with regard to his lower extremities from were things used to be. 09/20/18 on evaluation today patient actually appears to be doing much better. The erythematous lower extremity is improving wound itself which is still open appears to be doing much better as far as but appearance as well as pain is concerned overall very pleased in this regard. There's no signs of active infection at this time. 09/27/18 on evaluation today patient's wounds on the lower extremity actually appear to be doing fairly well at this time which is good news. There is no evidence of active infection currently and again is just as left  lower extremity were there any wounds at this point anyway. I believe they may be completely healed but again I'm not 100% sure based on evaluation today. I think one more week of observation would likely be a good idea. 10/04/18 on evaluation today patient actually appears to be doing excellent in regard to the left lower extremity which actually appears to be completely healed as of today. Unfortunately he's been having issues with his right lower extremity have a new wound that has opened. Fortunately there's no evidence of active infection at this time which is good news. 10/10/18 upon evaluation today patient actually appears to be doing a little bit worse with the new open area on his right posterior lower extremity. He's been tolerating the dressing changes without complication. Right now we been using his compression wraps although I think we may need to switch back to actually performing bilateral compression wraps are in the clinic. No fevers, chills, nausea, or vomiting  noted at this time. 10/17/18 on evaluation today patient actually appears to be doing quite well in regard to his bilateral lower extremity ulcers. He has been tolerating the reps without complication although he would prefer not to rewrap his legs as of today. Fortunately there's no signs of active infection which is good news. No fevers, chills, nausea, or vomiting noted at this time. 10/24/18 on evaluation today patient appears to be doing very well in regard to his lower extremities. His right lower extremity is shown signs of healing and his left lower Trinity though not healed appears to be improving which is excellent news. Overall very pleased with how things seem to be progressing at this time. The patient is likewise happy to hear this. His Velcro compression wraps however have not been put on properly will gonna show his wife how to do that properly today. 10/31/18 on evaluation today patient appears to be doing more poorly in regard to his left lower extremity in particular although both lower extremities actually are showing some signs of being worse than my previous evaluation. Unfortunately I'm just not sure that his compression stockings even with the use of the compression/lymphedema pumps seem to be controlling this well. Upon further questioning he tells me that he also is not able to lie flat in the bed due to his congestive heart failure and difficulty breathing. For that reason he sleeps in his recliner. To make matters worse his recliner also cannot even hold his legs up so instead of even being somewhat elevated they pretty much hang down to the floor. This is the way he sleeps each night which is definitely counterproductive to everything else that were attempting to do from the standpoint of controlling his fluid. Nonetheless I think that he potentially could benefit from a hospital bed although this would be something that his primary care provider would likely have to order  since anything that is order on our side has to be directly related to wound care and again the hospital bed is not necessarily a direct relation to although I think it does contribute to his overall wound status on his lower extremities. 11/07/18 on evaluation today patient appears to be doing better in regard to his bilateral lower extremity. He's been tolerating the dressing changes without complication. We did in the interim since I last saw him switch to just using extras orbiting new alginate and that seems to have done much better for him. I'm very pleased with the overall progress that is made. 11/14/18  on evaluation today patient appears to be redoing rather well in regard to his left lower extremity ulcers which are the only ones remaining at this point. Fortunately there's no signs of active infection at this time which is good news. Overall been very pleased with how things seem to be progressing currently. No fevers, chills, nausea, or vomiting noted at this time. 11/20/17 on evaluation today patient actually appears to be doing quite well in regard to his lower extremities on the left on the right he has several blisters that showed up although there's some question about whether or not he has had his broker compression wraps on like he was supposed to or not. Fortunately there's no signs of active infection at this time which is good news. Unfortunately though he is doing well from the standpoint of the left leg the right leg is not doing as well again this is when we did not wrap last week. 11/28/18 on evaluation today patient appears to be doing well in regard to his bilateral lower extremities is left appears to be healed is right is not healed but is very close to being so. Overall very pleased with how things seem to be progressing. Patient is likewise happy that things are doing well. 12/09/18 on evaluation today patient actually appears to be completely healed which is excellent news.  He actually seems to be doing well in regard to the swelling of the bilateral lower extremities which is also great news. Overall very pleased with how things seem to be progressing. Readmission: 03/18/2019 upon evaluation today patient presents for reevaluation concerning issues that he is having with his left lower extremity where he does have a wound noted upon inspection today. He also has a wound on the right second toe on the tip where it is apparently been rubbing in his shoe I did look at issues and you can actually see where this has been occurring as well. Fortunately there is no signs of infection with regard to Adam Merritt, Adam Merritt. ([id number]) the toe necessarily although it is very swollen compared to normal that does have me somewhat concerned about the possibility of further evaluation for infection/osteomyelitis. I would recommend an x-ray to start with. Otherwise he states that it is been 1-2 weeks that he has had the draining in regard to left lower extremity he does not know how this happened he has been wearing his compression appropriately and I do see that as well today but nonetheless I do think that he needs to continue to be very cautious with regard to elevation as well. 03/25/2019 on evaluation today patient appears to be doing much better in regard to his lower extremity at this time. That is on the left. He did culture positive for Pseudomonas but the good news is he seems to be doing much better the gentamicin we applied topically seems to have done a great job for him. There is no signs of active infection at this time systemically and locally he is doing much better. No fevers, chills, nausea, vomiting, or diarrhea. In regard to his right toe this seems to be doing much better there is very little pressure noted at this point I did clean up some of the callus today around the edges of the wound as well as the surface of the wound but to be honest he is progressing quite  nicely. 10/30; the area on the left anterior lower tibia area is healed over. His edema control is adequate even though  his use of the compression pumps seems very intermittent. He has a juxta lite stocking on the right leg and a think there is 1 for the left leg at home. His wife is putting these on. He has an area on the plantar right second toe which is a hammertoe they are trying to offload this 04/08/2019 on evaluation today patient appears to be doing well in regard to his lower extremities which is good news. We are seeing him today for his toe ulcer which is giving him a little bit more trouble but again still seems to be doing better at this point which is good news. He is going require some sharp debridement in regard to the toe today however. 04/15/2019 on evaluation today patient actually appears to be doing quite well with regard to his toe ulcer. I am very pleased with how things are doing in that regard. With regard to his lower extremity edema in general he tells me he is not been using the lymphedema pumps regularly although he does not use them. I think that he needs to use them more regularly he does have a small blister on the left lower extremity this is not open at this point but obviously it something that can get worse if he does not keep the compression going and the pumps going as well. 04/22/2019 on evaluation today patient appears to be doing well with regard to his toe ulcer. He has been tolerating the dressing changes without complication. This is measuring slightly smaller this week as compared to last week. Fortunately there is no signs of active infection at this time. 05/06/2019 on evaluation today patient actually appears to be doing excellent. In fact his toe appears to be completely healed which is great news. There is no signs of active infection at this time. Readmission: Patient presents today for follow-up after having been discharged at the beginning of  December 2020. He states that in recent weeks things have reopened and has been having more trouble at this point. With that being said fortunately there is no signs of active infection at this time. No fever chills noted. Nonetheless he also does have an issue with one of his toes as well that seems to be somewhat this is on the right foot second toe. 07/25/2019 upon evaluation today patient's lower extremities appear to be doing excellent bilaterally. I really feel like he is showing signs of great improvement and in fact I think he is close to healing which is great news. There is no signs of active infection at this time. No fevers, chills, nausea, vomiting, or diarrhea. 07/31/2019 upon evaluation today patient appears to be doing excellent and in fact I think may be completely healed in regard to his right lower extremity that were still to monitor this 1 more week before closing it out. The left lower extremity is measuring smaller although not completely closed seems to be doing excellent. 08/07/2019 upon evaluation today patient appears to be doing excellent in regard to his wounds. In fact the right lower extremity is completely healed there is no issues here. The left lower extremity has 1 very small area still remaining fortunately there is no signs of infection and overall I think he is very close to closure of the here as well. 08/14/19 upon evaluation today patient appears to be doing excellent in regard to his lower extremities. In fact he appears to be completely healed as of today. Fortunately there's no signs of active infection  at this time. No fevers, chills, nausea, or vomiting noted at this time. READMISSION 09/26/2019 This is a 74 year old man who is well-known to this clinic having just been discharged on 08/14/2019. He has known lymphedema and chronic venous insufficiency. He has [place] wrap stockings and also external compression pumps. He does not use the external compression  pumps however. He does however apparently fairly faithfully use the stockings. They have not been lotion in his legs. He has developed new wounds on the right anterior as well as the left medial left lateral and left posterior calf. He returns for our review of this Past medical history includes coronary artery disease, hypertension, congestive heart failure, COPD, venous insufficiency with lymphedema, type 2 diabetes. He apparently has compression pumps, [place] wraps and at 1 point a hospital bed although I believe he sleeps in a recliner 10/03/2019 upon evaluation today patient appears to be doing better with regard to his wounds in general. He has been tolerating the dressing changes without complication. Fortunately there is no signs of active infection. No fevers, chills, nausea, vomiting, or diarrhea. I do believe the compression wraps are helping as they always have in the past. 10/10/2019 upon evaluation today patient appears to be doing excellent at this point. His right lower extremity is measuring much better and in fact is completely healed. His left lower extremity is also measuring better though not healed seems to be doing excellent at this point which is great news. Overall I am very pleased with how things appear currently. 10/27/2019 upon evaluation today patient appears to be doing quite well with regard to his wounds currently. He has been tolerating the dressing changes without complication. Fortunately there is no signs of active infection at this time. In fact he is almost completely healed on the left and has just a very small area open and on the right he is completely healed. 11/04/2019 upon evaluation today patient appears to be doing quite well with regard to his wounds. In fact he appears to be quite possibly completely healed on the left lower extremity now as well the right lower extremity is still doing well. With that being said unfortunately though this may be healed I  think it is a very fragile area and I cannot even confirm there is not a very small opening still remaining. I really think he may benefit from 1 additional weeks wrapping before discontinuing. Adam Merritt, Adam Merritt ([id number]) 11/10/2019 upon evaluation today patient appears to be doing well in regard to his original wounds in fact everything that we were treating last week is completely healed on the left. Unfortunately he has a new skin tear just above where the wrap slipped down due to a fall he sustained causing this injury. 11/17/2019 on evaluation today patient appears to be doing better with regard to his wound. He has been tolerating the dressing changes without complication. Fortunately there is no signs of active infection at this time. No fevers, chills, nausea, vomiting, or diarrhea. 11/24/2019 upon evaluation today patient appears to be doing well with regard to his wound. This is making good progress there does not appear to be signs of active infection but overall I do feel like he is headed in the correct direction. Overall he is tolerating the compression wrap as well. 12/02/2019 upon evaluation today patient appears to be doing well with regard to his leg ulcer. In fact this appears to be completely healed and this is the last of the wounds that he had  but still the left anterior lower extremity. Fortunately there is no signs of active infection at this time. No fevers, chills, nausea, vomiting, or diarrhea. Readmission: 02/05/2020 on evaluation today patient appears to be doing well with regard to his wound all things considered. He actually tells me that a couple weeks ago he had trouble with his wraps and therefore took him off for a couple of days in order to give his legs a break. It was during this time that he actually developed increased swelling and edema of his legs and blisters on both legs. That is when he called to make the appointment. Nonetheless since that time he began wearing  his compression wraps which are Velcro in the meantime and has done extremely well with this. In fact his leg appears to be under good control as far as edema is concerned today it is nothing like what I am seeing when he has come in for evaluations in the past. They have been using some of the silver alginate at home which has been beneficial for him. Fortunately there is no signs of active infection at this time. No fevers, chills, nausea, vomiting, or diarrhea. 02/19/2020 on evaluation today patient unfortunately does not appear to be doing quite as well as I would like to see. He has no signs of active infection at this time but unfortunately he is not doing well in regard to the overall appearance of his left leg. He has a couple other areas that are weeping now and the leg is much more swollen. I think that he needs to actually have a compression wrap to try to manage this. 02/26/2020 on evaluation today patient appears to be doing well with regard to his left lower extremity ulcer. Fortunately the swelling is down I do believe the pressure wrap seems to be doing quite well. Fortunately there is no signs of active infection at this time. 03/05/2020 upon evaluation today patient appears to be doing excellent in regard to his legs at this point. Fortunately there is no signs of active infection which is great news. Overall he feels like he is completely healed which is great news. Readmission: 05/20/2020 upon evaluation today patient appears to be doing well with regard to his leg ulcer all things considered. He is actually coming back to see [place] after having had a reopening of the ulcers on his left leg in the past several weeks. He is out of dressing supplies and his wife is been try to take care of this as best she can. Patient History Information obtained from Patient. Allergies Corticosteroids (Glucocorticoids) Family History Cancer - Paternal Grandparents, Kidney Disease - Siblings, Lung  Disease - Mother, No family history of Diabetes, Heart Disease, Hereditary Spherocytosis, Hypertension, Seizures, Stroke, Thyroid Problems, Tuberculosis. Social History Never smoker, Marital Status - Married, Alcohol Use - Never, Drug Use - No History, Caffeine Use - Never. Medical History Eyes Denies history of Cataracts, Glaucoma, Optic Neuritis Ear/Nose/Mouth/Throat Denies history of Chronic sinus problems/congestion, Middle ear problems Hematologic/Lymphatic Patient has history of Anemia - aplastic anemia, Lymphedema Denies history of Hemophilia, Human Immunodeficiency Virus, Sickle Cell Disease Respiratory Patient has history of Chronic Obstructive Pulmonary Disease (COPD), Sleep Apnea Denies history of Aspiration, Asthma, Pneumothorax, Tuberculosis Cardiovascular Patient has history of Congestive Heart Failure, Coronary Artery Disease, Hypertension, Peripheral Venous Disease Denies history of Angina, Arrhythmia, Deep Vein Thrombosis, Hypotension, Myocardial Infarction, Peripheral Arterial Disease, Phlebitis, Vasculitis Gastrointestinal Denies history of Cirrhosis , Colitis, Crohn s, Hepatitis A, Hepatitis B, Hepatitis  C Endocrine Patient has history of Type II Diabetes Denies history of Type I Diabetes Genitourinary Denies history of End Stage Renal Disease Immunological Denies history of Lupus Erythematosus, Raynaud s, Scleroderma Integumentary (Skin) Denies history of History of Burn, History of pressure wounds ANNIE, ROSEBOOM ([id number]) Musculoskeletal Patient has history of Osteoarthritis Denies history of Gout, Rheumatoid Arthritis, Osteomyelitis Neurologic Patient has history of Neuropathy Denies history of Dementia, Quadriplegia, Paraplegia, Seizure Disorder Oncologic Patient has history of Received Chemotherapy - last dose 2006 Denies history of Received Radiation Psychiatric Denies history of Anorexia/bulimia, Confinement Anxiety Patient is treated with  Insulin. Blood sugar is tested. Blood sugar results noted at the following times: Breakfast - 135. Hospitalization/Surgery History - bronchitis. Medical And Surgical History Notes Respiratory home O2 at night as needed Cardiovascular varicose veins Neurologic CVA - 1992 Review of Systems (ROS) Constitutional Symptoms (General Health) Denies complaints or symptoms of Fatigue, Fever, Chills, Marked Weight Change. Objective Constitutional patient is hypertensive.. pulse regular and within target range for patient.Marland Kitchen respirations regular, non-labored and within target range for patient.Marland Kitchen temperature within target range for patient.. Well-nourished and well-hydrated in no acute distress. Vitals Time Taken: 2:47 PM, Height: 66 in, Weight: 288 lbs, BMI: 46.5, Temperature: 98.2 F, Pulse: 66 bpm, Respiratory Rate: 18 breaths/min, Blood Pressure: 153/83 mmHg. Eyes conjunctiva clear no eyelid edema noted. pupils equal round and reactive to light and accommodation. Respiratory normal breathing without difficulty. Cardiovascular 2+ dorsalis pedis/posterior tibialis pulses. 1+ pitting edema of the bilateral lower extremities. Musculoskeletal Patient unable to walk without assistance. no significant deformity or arthritic changes, no loss or range of motion, no clubbing. Psychiatric this patient is able to make decisions and demonstrates good insight into disease process. Alert and Oriented x 3. pleasant and cooperative. General Notes: Upon inspection patient's wound bed actually showed signs of having some slough on the surface of the wound as well as some dry skin around I did mechanically debride this way with saline and gauze and the patient tolerated that today without complication fortunately the wound does not appear to be big nor deep which is good news. Nonetheless I do believe that he does need some intervention here to try to help with getting this to heal I think getting him some  supplies would be beneficial for him. Integumentary (Hair, Skin) Wound #37 status is Open. Original cause of wound was Gradually Appeared. The wound is located on the Left,Medial Lower Leg. The wound measures 0.5cm length x 0.5cm width x 0.1cm depth; 0.196cm^2 area and 0.02cm^3 volume. There is no tunneling or undermining noted. There is a large amount of serous drainage noted. The wound margin is flat and intact. There is no granulation within the wound bed. There is no necrotic tissue within the wound bed. Wound #38 status is Open. Original cause of wound was Gradually Appeared. The wound is located on the Left,Posterior Lower Leg. The wound measures 2cm length x 7cm width x 0.1cm depth; 10.996cm^2 area and 1.1cm^3 volume. There is Fat Layer (Subcutaneous Tissue) exposed. There is no tunneling or undermining noted. There is a large amount of serous drainage noted. There is large (67-100%) granulation within the wound bed. There is no necrotic tissue within the wound bed. AMUN, STEMM ([id number]) Other Condition(s) Patient presents with Lymphedema located on the Bilateral Leg. General Notes: Patient has two excoriated areas. Assessment Active Problems ICD-10 Lymphedema, not elsewhere classified Venous insufficiency (chronic) (peripheral) Non-pressure chronic ulcer of other part of left lower leg with fat layer exposed  Plan 1. Would recommend at this time that we have the patient going to continue with the wound care measures as before specifically with regard to the silver alginate dressing. Again this will they have been using in the interim since we last saw him and that has done excellent for him. There does not appear to be any signs of active infection which is great news. 2. I am also can recommend that he continue with lymphedema wraps that seem to be doing a good job at helping to keep the edema under good control and I think he is doing excellent in that regard. 3. I am also  can recommend that the patient continue to monitor for any signs of anything getting worse especially from the standpoint of redness and infection if anything occurs he should let me know soon as possible. He should let me know soon as possible. We will see patient back for reevaluation in 1 week here in the clinic. If anything worsens or changes patient will contact our office for additional recommendations. Electronic Signature(s) Signed: 05/20/2020 3:22:18 PM By: Worthy Keeler PA-C Entered By: Worthy Keeler on 05/20/2020 15:22:17 Adam Merritt ([id number]) -------------------------------------------------------------------------------- ROS/PFSH Details Patient Name: Adam Merritt Date of Service: 05/20/2020 2:30 PM Medical Record Number: [id number] Patient Account Number: [id number] Date of Birth/Sex: 1946-01-17 (74 y.o. M) Treating RN: Cornell Barman Primary Care Provider: [name] Other Clinician: Referring Provider: [name] Treating Provider/Extender: Skipper Cliche in Treatment: 0 Information Obtained From Patient Constitutional Symptoms (General Health) Complaints and Symptoms: Negative for: Fatigue; Fever; Chills; Marked Weight Change Eyes Medical History: Negative for: Cataracts; Glaucoma; Optic Neuritis Ear/Nose/Mouth/Throat Medical History: Negative for: Chronic sinus problems/congestion; Middle ear problems Hematologic/Lymphatic Medical History: Positive for: Anemia - aplastic anemia; Lymphedema Negative for: Hemophilia; Human Immunodeficiency Virus; Sickle Cell Disease Respiratory Medical History: Positive for: Chronic Obstructive Pulmonary Disease (COPD); Sleep Apnea Negative for: Aspiration; Asthma; Pneumothorax; Tuberculosis Past Medical History Notes: home O2 at night as needed Cardiovascular Medical History: Positive for: Congestive Heart Failure; Coronary Artery Disease; Hypertension; Peripheral Venous Disease Negative for: Angina;  Arrhythmia; Deep Vein Thrombosis; Hypotension; Myocardial Infarction; Peripheral Arterial Disease; Phlebitis; Vasculitis Past Medical History Notes: varicose veins Gastrointestinal Medical History: Negative for: Cirrhosis ; Colitis; Crohnos; Hepatitis A; Hepatitis B; Hepatitis C Endocrine Medical History: Positive for: Type II Diabetes Negative for: Type I Diabetes Time with diabetes: since 2006 Treated with: Insulin Blood sugar tested every day: Yes Tested : Blood sugar testing results: Breakfast: [place] ([id number]) Medical History: Negative for: End Stage Renal Disease Immunological Medical History: Negative for: Lupus Erythematosus; Raynaudos; Scleroderma Integumentary (Skin) Medical History: Negative for: History of Burn; History of pressure wounds Musculoskeletal Medical History: Positive for: Osteoarthritis Negative for: Gout; Rheumatoid Arthritis; Osteomyelitis Neurologic Medical History: Positive for: Neuropathy Negative for: Dementia; Quadriplegia; Paraplegia; Seizure Disorder Past Medical History Notes: CVA - 1992 Oncologic Medical History: Positive for: Received Chemotherapy - last dose 2006 Negative for: Received Radiation Psychiatric Medical History: Negative for: Anorexia/bulimia; Confinement Anxiety Immunizations Pneumococcal Vaccine: Received Pneumococcal Vaccination: Yes Implantable Devices None Hospitalization / Surgery History Type of Hospitalization/Surgery bronchitis Family and Social History Cancer: Yes - Paternal Grandparents; Diabetes: No; Heart Disease: No; Hereditary Spherocytosis: No; Hypertension: No; Kidney Disease: Yes - Siblings; Lung Disease: Yes - Mother; Seizures: No; Stroke: No; Thyroid Problems: No; Tuberculosis: No; Never smoker; Marital Status - Married; Alcohol Use: Never; Drug Use: No History; Caffeine Use: Never; Financial Concerns: No; Food, Clothing or Shelter Needs: No; Support  System  Lacking: No; Transportation Concerns: No Electronic Signature(s) Signed: 05/20/2020 4:55:27 PM By: Worthy Keeler PA-C Signed: 05/20/2020 5:16:15 PM By: Gretta Cool, BSN, RN, CWS, Kim RN, BSN Entered By: Gretta Cool, BSN, RN, CWS, Kim on 05/20/2020 14:51:18 Adam Merritt ([id number]) -------------------------------------------------------------------------------- [place] Details Patient Name: Adam Merritt Date of Service: 05/20/2020 Medical Record Number: [id number] Patient Account Number: [id number] Date of Birth/Sex: February 17, 1946 (74 y.o. M) Treating RN: Cornell Barman Primary Care Provider: [name] Other Clinician: Referring Provider: [name] Treating Provider/Extender: Jeri Cos [name]: 0 Diagnosis Coding ICD-10 Codes Code Description I89.0 Lymphedema, not elsewhere classified I87.2 Venous insufficiency (chronic) (peripheral) L97.822 Non-pressure chronic ulcer of other part of left lower leg with fat layer exposed Facility Procedures CPT4 Code: 97530051 Description: [facility] VISIT-LEV 4 EST PT Modifier: Quantity: 1 Physician Procedures CPT4 Code: 1021117 Description: 35670 - WC PHYS LEVEL 3 - EST PT Modifier: Quantity: 1 CPT4 Code: Description: ICD-10 Diagnosis Description I89.0 Lymphedema, not elsewhere classified I87.2 Venous insufficiency (chronic) (peripheral) L97.822 Non-pressure chronic ulcer of other part of left lower leg with fat lay Modifier: er exposed Quantity: Electronic Signature(s) Signed: 05/20/2020 4:01:41 PM By: Gretta Cool, BSN, RN, CWS, Kim RN, BSN Signed: 05/20/2020 4:55:27 PM By: Worthy Keeler PA-C Previous Signature: 05/20/2020 3:23:38 PM Version By: Worthy Keeler PA-C Entered By: Gretta Cool, BSN, RN, CWS, Kim on 05/20/2020 16:01:40

## 2020-05-21 NOTE — Progress Notes (Signed)
Adam Merritt, Adam Merritt ([id number]) Visit Report for 05/20/2020 Abuse/Suicide Risk Screen Details Patient Name: Adam Merritt, Adam Merritt Date of Service: 05/20/2020 2:30 PM Medical Record Number: [id number] Patient Account Number: [id number] Date of Birth/Sex: 1946-05-16 (74 y.o. M) Treating RN: Cornell Barman Primary Care Tylen Leverich: [name] Other Clinician: Referring Alpa Salvo: [name] Treating Francee Setzer/Extender: Skipper Cliche in Treatment: 0 Abuse/Suicide Risk Screen Items Answer ABUSE RISK SCREEN: Has anyone close to you tried to hurt or harm you recentlyo No Do you feel uncomfortable with anyone in your familyo No Has anyone forced you do things that you didnot want to doo No Electronic Signature(s) Signed: 05/20/2020 5:16:15 PM By: Gretta Cool, BSN, RN, CWS, Kim RN, BSN Entered By: Gretta Cool, BSN, RN, CWS, Kim on 05/20/2020 14:51:36 Adam Merritt ([id number]) -------------------------------------------------------------------------------- Activities of Daily Living Details Patient Name: Adam Merritt Date of Service: 05/20/2020 2:30 PM Medical Record Number: [id number] Patient Account Number: [id number] Date of Birth/Sex: [birth year] (74 y.o. M) Treating RN: Cornell Barman Primary Care Bennett Ram: [name] Other Clinician: Referring Idelia Caudell: [name] Treating Cyruss Arata/Extender: Skipper Cliche in Treatment: 0 Activities of Daily Living Items Answer Activities of Daily Living (Please select one for each item) Drive Automobile Not Able Take Medications Need Assistance Use Telephone Completely Able Care for Appearance Need Assistance Use Toilet Completely Able Bath / Shower Need Assistance Dress Self Need Assistance Feed Self Need Assistance Walk Need Assistance Get In / Out Bed Need Assistance Housework Need Assistance Prepare Meals Need Assistance Handle Money Need Assistance Shop for Self Need Assistance Electronic Signature(s) Signed: 05/20/2020 5:16:15 PM By: Gretta Cool, BSN, RN,  CWS, Kim RN, BSN Entered By: Gretta Cool, BSN, RN, CWS, Kim on 05/20/2020 14:52:13 Adam Merritt ([id number]) -------------------------------------------------------------------------------- Education Screening Details Patient Name: Adam Merritt Date of Service: 05/20/2020 2:30 PM Medical Record Number: [id number] Patient Account Number: [id number] Date of Birth/Sex: 10-30-1945 (74 y.o. M) Treating RN: Cornell Barman Primary Care Keirstin Musil: [name] Other Clinician: Referring Leveon Pelzer: [name] Treating Carley Glendenning/Extender: Skipper Cliche in Treatment: 0 Primary Learner Assessed: Patient Learning Preferences/Education Level/Primary Language Learning Preference: Explanation Highest Education Level: High School Preferred Language: English Cognitive Barrier Language Barrier: No Translator Needed: No Memory Deficit: No Emotional Barrier: No Physical Barrier Impaired Vision: Yes Impaired Hearing: Yes Hearing Aid Decreased Hand dexterity: No Knowledge/Comprehension Knowledge Level: Medium Comprehension Level: Medium Ability to understand written instructions: Medium Ability to understand verbal instructions: Medium Motivation Anxiety Level: Calm Cooperation: Cooperative Education Importance: Acknowledges Need Interest in Health Problems: Asks Questions Perception: Coherent Willingness to Engage in Self-Management High Activities: Readiness to Engage in Self-Management High Activities: Engineer, maintenance) Signed: 05/20/2020 5:16:15 PM By: Gretta Cool, BSN, RN, CWS, Kim RN, BSN Entered By: Gretta Cool, BSN, RN, CWS, Kim on 05/20/2020 14:53:01 Adam Merritt ([id number]) -------------------------------------------------------------------------------- Fall Risk Assessment Details Patient Name: Adam Merritt Date of Service: 05/20/2020 2:30 PM Medical Record Number: [id number] Patient Account Number: [id number] Date of Birth/Sex: 1945/12/26 (74 y.o. M) Treating RN: Cornell Barman Primary Care Brienna Bass: [name] Other Clinician: Referring Calvary Difranco: [name] Treating Makana Feigel/Extender: Skipper Cliche in Treatment: 0 Fall Risk Assessment Items Have you had 2 or more falls in the last 12 monthso 0 No Have you had any fall that resulted in injury in the last 12 monthso 0 No FALLS RISK SCREEN History of falling - immediate or within 3 months 0 No Secondary diagnosis (Do you have 2 or more medical diagnoseso) 0 No Ambulatory aid None/bed rest/wheelchair/nurse 0 Yes Crutches/cane/walker 0 No Furniture 0 No Intravenous  therapy Access/Saline/Heparin Lock 0 No Gait/Transferring Normal/ bed rest/ wheelchair 0 Yes Weak (short steps with or without shuffle, stooped but able to lift head while walking, may 0 No seek support from furniture) Impaired (short steps with shuffle, may have difficulty arising from chair, head down, impaired 0 No balance) Mental Status Oriented to own ability 0 Yes Electronic Signature(s) Signed: 05/20/2020 5:16:15 PM By: Gretta Cool, BSN, RN, CWS, Kim RN, BSN Entered By: Gretta Cool, BSN, RN, CWS, Kim on 05/20/2020 14:54:04 Adam Merritt ([id number]) -------------------------------------------------------------------------------- Foot Assessment Details Patient Name: Adam Merritt Date of Service: 05/20/2020 2:30 PM Medical Record Number: [id number] Patient Account Number: [id number] Date of Birth/Sex: 1945-12-19 (74 y.o. M) Treating RN: Cornell Barman Primary Care Koreena Joost: [name] Other Clinician: Referring Morgen Linebaugh: [name] Treating Scotti Kosta/Extender: Jeri Cos [name]: 0 Foot Assessment Items Site Locations + = Sensation present, - = Sensation absent, C = Callus, U = Ulcer R = Redness, W = Warmth, M = Maceration, PU = Pre-ulcerative lesion F = Fissure, S = Swelling, D = Dryness Assessment Right: Left: Other Deformity: No No Prior Foot Ulcer: No No Prior Amputation: No No Charcot Joint: No No Ambulatory  Status: Ambulatory With Help Assistance Device: Wheelchair Gait: Unsteady Notes Patient uses wheelchair when outside of home. Electronic Signature(s) Signed: 05/20/2020 5:16:15 PM By: Gretta Cool, BSN, RN, CWS, Kim RN, BSN Entered By: Gretta Cool, BSN, RN, CWS, Kim on 05/20/2020 14:55:01 Adam Merritt ([id number]) -------------------------------------------------------------------------------- Nutrition Risk Screening Details Patient Name: Adam Merritt, Adam Merritt Date of Service: 05/20/2020 2:30 PM Medical Record Number: [id number] Patient Account Number: [id number] Date of Birth/Sex: [birth year] (74 y.o. M) Treating RN: Cornell Barman Primary Care Clevland Cork: [name] Other Clinician: Referring Zanylah Hardie: [name] Treating Sameria Morss/Extender: Jeri Cos [name]: 0 Height (in): 66 Weight (lbs): 288 Body Mass Index (BMI): 46.5 Nutrition Risk Screening Items Score Screening NUTRITION RISK SCREEN: I have an illness or condition that made me change the kind and/or amount of food I eat 0 No I eat fewer than two meals per day 0 No I eat few fruits and vegetables, or milk products 0 No I have three or more drinks of beer, liquor or wine almost every day 0 No I have tooth or mouth problems that make it hard for me to eat 0 No I don't always have enough money to buy the food I need 0 No I eat alone most of the time 0 No I take three or more different prescribed or over-the-counter drugs a day 1 Yes Without wanting to, I have lost or gained 10 pounds in the last six months 0 No I am not always physically able to shop, cook and/or feed myself 0 No Nutrition Protocols Good Risk Protocol Provide education on elevated Moderate Risk Protocol 0 blood sugars and impact on wound healing, as applicable High Risk Proctocol Risk Level: Good Risk Score: 1 Electronic Signature(s) Signed: 05/20/2020 5:16:15 PM By: Gretta Cool, BSN, RN, CWS, Kim RN, BSN Entered By: Gretta Cool, BSN, RN, CWS, Kim on 05/20/2020  14:54:23

## 2020-05-26 ENCOUNTER — Telehealth: Payer: Self-pay

## 2020-05-26 NOTE — Addendum Note (Signed)
Addended by: Corlis Hove on: 05/26/2020 09:55 AM   Modules accepted: Orders

## 2020-05-26 NOTE — Telephone Encounter (Signed)
Gave referral for home health to [facility] due to [facility] declined to take pt and [facility] will take care for pt

## 2020-05-27 DIAGNOSIS — E1122 Type 2 diabetes mellitus with diabetic chronic kidney disease: Secondary | ICD-10-CM | POA: Diagnosis not present

## 2020-05-27 DIAGNOSIS — I890 Lymphedema, not elsewhere classified: Secondary | ICD-10-CM | POA: Diagnosis not present

## 2020-05-27 DIAGNOSIS — E1142 Type 2 diabetes mellitus with diabetic polyneuropathy: Secondary | ICD-10-CM | POA: Diagnosis not present

## 2020-05-27 DIAGNOSIS — N183 Chronic kidney disease, stage 3 unspecified: Secondary | ICD-10-CM | POA: Diagnosis not present

## 2020-05-27 DIAGNOSIS — I130 Hypertensive heart and chronic kidney disease with heart failure and stage 1 through stage 4 chronic kidney disease, or unspecified chronic kidney disease: Secondary | ICD-10-CM | POA: Diagnosis not present

## 2020-05-27 DIAGNOSIS — J441 Chronic obstructive pulmonary disease with (acute) exacerbation: Secondary | ICD-10-CM | POA: Diagnosis not present

## 2020-05-27 DIAGNOSIS — I5032 Chronic diastolic (congestive) heart failure: Secondary | ICD-10-CM | POA: Diagnosis not present

## 2020-05-27 DIAGNOSIS — S161XXD Strain of muscle, fascia and tendon at neck level, subsequent encounter: Secondary | ICD-10-CM | POA: Diagnosis not present

## 2020-06-01 ENCOUNTER — Other Ambulatory Visit: Payer: Self-pay

## 2020-06-02 ENCOUNTER — Telehealth: Payer: Self-pay

## 2020-06-02 ENCOUNTER — Other Ambulatory Visit: Payer: Self-pay

## 2020-06-02 DIAGNOSIS — N183 Chronic kidney disease, stage 3 unspecified: Secondary | ICD-10-CM | POA: Diagnosis not present

## 2020-06-02 DIAGNOSIS — E1142 Type 2 diabetes mellitus with diabetic polyneuropathy: Secondary | ICD-10-CM | POA: Diagnosis not present

## 2020-06-02 DIAGNOSIS — J441 Chronic obstructive pulmonary disease with (acute) exacerbation: Secondary | ICD-10-CM | POA: Diagnosis not present

## 2020-06-02 DIAGNOSIS — S161XXD Strain of muscle, fascia and tendon at neck level, subsequent encounter: Secondary | ICD-10-CM | POA: Diagnosis not present

## 2020-06-02 DIAGNOSIS — E1122 Type 2 diabetes mellitus with diabetic chronic kidney disease: Secondary | ICD-10-CM | POA: Diagnosis not present

## 2020-06-02 DIAGNOSIS — I890 Lymphedema, not elsewhere classified: Secondary | ICD-10-CM | POA: Diagnosis not present

## 2020-06-02 DIAGNOSIS — I5032 Chronic diastolic (congestive) heart failure: Secondary | ICD-10-CM | POA: Diagnosis not present

## 2020-06-02 DIAGNOSIS — I130 Hypertensive heart and chronic kidney disease with heart failure and stage 1 through stage 4 chronic kidney disease, or unspecified chronic kidney disease: Secondary | ICD-10-CM | POA: Diagnosis not present

## 2020-06-02 MED ORDER — TIZANIDINE HCL 2 MG PO TABS: 2.0000 mg | ORAL_TABLET | Freq: Three times a day (TID) | ORAL | 0 refills | Status: DC

## 2020-06-02 NOTE — Telephone Encounter (Signed)
Spoke to pharmacy and changed the Tizanidine capsules to tablets per Lovena Le because that is what is covered by [employer]. LMOM to let pt know to check the pharmacy for meds.

## 2020-06-03 DIAGNOSIS — J441 Chronic obstructive pulmonary disease with (acute) exacerbation: Secondary | ICD-10-CM | POA: Diagnosis not present

## 2020-06-03 DIAGNOSIS — N183 Chronic kidney disease, stage 3 unspecified: Secondary | ICD-10-CM | POA: Diagnosis not present

## 2020-06-03 DIAGNOSIS — I5032 Chronic diastolic (congestive) heart failure: Secondary | ICD-10-CM | POA: Diagnosis not present

## 2020-06-03 DIAGNOSIS — S161XXD Strain of muscle, fascia and tendon at neck level, subsequent encounter: Secondary | ICD-10-CM | POA: Diagnosis not present

## 2020-06-03 DIAGNOSIS — E1142 Type 2 diabetes mellitus with diabetic polyneuropathy: Secondary | ICD-10-CM | POA: Diagnosis not present

## 2020-06-03 DIAGNOSIS — I890 Lymphedema, not elsewhere classified: Secondary | ICD-10-CM | POA: Diagnosis not present

## 2020-06-03 DIAGNOSIS — E1122 Type 2 diabetes mellitus with diabetic chronic kidney disease: Secondary | ICD-10-CM | POA: Diagnosis not present

## 2020-06-03 DIAGNOSIS — I130 Hypertensive heart and chronic kidney disease with heart failure and stage 1 through stage 4 chronic kidney disease, or unspecified chronic kidney disease: Secondary | ICD-10-CM | POA: Diagnosis not present

## 2020-06-06 DIAGNOSIS — I509 Heart failure, unspecified: Secondary | ICD-10-CM | POA: Diagnosis not present

## 2020-06-08 DIAGNOSIS — I890 Lymphedema, not elsewhere classified: Secondary | ICD-10-CM | POA: Diagnosis not present

## 2020-06-08 DIAGNOSIS — E1142 Type 2 diabetes mellitus with diabetic polyneuropathy: Secondary | ICD-10-CM | POA: Diagnosis not present

## 2020-06-08 DIAGNOSIS — E1122 Type 2 diabetes mellitus with diabetic chronic kidney disease: Secondary | ICD-10-CM | POA: Diagnosis not present

## 2020-06-08 DIAGNOSIS — J441 Chronic obstructive pulmonary disease with (acute) exacerbation: Secondary | ICD-10-CM | POA: Diagnosis not present

## 2020-06-08 DIAGNOSIS — I130 Hypertensive heart and chronic kidney disease with heart failure and stage 1 through stage 4 chronic kidney disease, or unspecified chronic kidney disease: Secondary | ICD-10-CM | POA: Diagnosis not present

## 2020-06-08 DIAGNOSIS — I5032 Chronic diastolic (congestive) heart failure: Secondary | ICD-10-CM | POA: Diagnosis not present

## 2020-06-08 DIAGNOSIS — N183 Chronic kidney disease, stage 3 unspecified: Secondary | ICD-10-CM | POA: Diagnosis not present

## 2020-06-08 DIAGNOSIS — S161XXD Strain of muscle, fascia and tendon at neck level, subsequent encounter: Secondary | ICD-10-CM | POA: Diagnosis not present

## 2020-06-10 ENCOUNTER — Other Ambulatory Visit: Payer: Self-pay

## 2020-06-10 ENCOUNTER — Encounter: Payer: Medicare PPO | Attending: Physician Assistant | Admitting: Physician Assistant

## 2020-06-10 DIAGNOSIS — S161XXD Strain of muscle, fascia and tendon at neck level, subsequent encounter: Secondary | ICD-10-CM | POA: Diagnosis not present

## 2020-06-10 DIAGNOSIS — I890 Lymphedema, not elsewhere classified: Secondary | ICD-10-CM | POA: Diagnosis not present

## 2020-06-10 DIAGNOSIS — E11622 Type 2 diabetes mellitus with other skin ulcer: Secondary | ICD-10-CM | POA: Diagnosis not present

## 2020-06-10 DIAGNOSIS — E1140 Type 2 diabetes mellitus with diabetic neuropathy, unspecified: Secondary | ICD-10-CM | POA: Insufficient documentation

## 2020-06-10 DIAGNOSIS — I110 Hypertensive heart disease with heart failure: Secondary | ICD-10-CM | POA: Diagnosis not present

## 2020-06-10 DIAGNOSIS — J441 Chronic obstructive pulmonary disease with (acute) exacerbation: Secondary | ICD-10-CM | POA: Diagnosis not present

## 2020-06-10 DIAGNOSIS — E1142 Type 2 diabetes mellitus with diabetic polyneuropathy: Secondary | ICD-10-CM | POA: Diagnosis not present

## 2020-06-10 DIAGNOSIS — I509 Heart failure, unspecified: Secondary | ICD-10-CM | POA: Diagnosis not present

## 2020-06-10 DIAGNOSIS — N183 Chronic kidney disease, stage 3 unspecified: Secondary | ICD-10-CM | POA: Diagnosis not present

## 2020-06-10 DIAGNOSIS — I872 Venous insufficiency (chronic) (peripheral): Secondary | ICD-10-CM | POA: Diagnosis not present

## 2020-06-10 DIAGNOSIS — L97822 Non-pressure chronic ulcer of other part of left lower leg with fat layer exposed: Secondary | ICD-10-CM | POA: Insufficient documentation

## 2020-06-10 DIAGNOSIS — I2510 Atherosclerotic heart disease of native coronary artery without angina pectoris: Secondary | ICD-10-CM | POA: Diagnosis not present

## 2020-06-10 DIAGNOSIS — E1122 Type 2 diabetes mellitus with diabetic chronic kidney disease: Secondary | ICD-10-CM | POA: Diagnosis not present

## 2020-06-10 DIAGNOSIS — I5032 Chronic diastolic (congestive) heart failure: Secondary | ICD-10-CM | POA: Diagnosis not present

## 2020-06-10 DIAGNOSIS — I130 Hypertensive heart and chronic kidney disease with heart failure and stage 1 through stage 4 chronic kidney disease, or unspecified chronic kidney disease: Secondary | ICD-10-CM | POA: Diagnosis not present

## 2020-06-10 NOTE — Progress Notes (Addendum)
Adam Merritt, Adam Merritt ([id number]) Visit Report for 06/10/2020 Chief Complaint Document Details Patient Name: Adam Merritt Date of Service: 06/10/2020 12:45 PM Medical Record Number: [id number] Patient Account Number: [id number] Date of Birth/Sex: 05/25/1946 (75 y.o. M) Treating RN: Cornell Barman Primary Care Provider: [name] Other Clinician: Referring Provider: [name] Treating Provider/Extender: Skipper Cliche in Treatment: 3 Information Obtained from: Patient Chief Complaint Left leg ulcers Electronic Signature(s) Signed: 06/10/2020 1:12:30 PM By: Worthy Keeler PA-C Entered By: Worthy Keeler on 06/10/2020 13:12:30 Adam Merritt ([id number]) -------------------------------------------------------------------------------- HPI Details Patient Name: Adam Merritt Date of Service: 06/10/2020 12:45 PM Medical Record Number: [id number] Patient Account Number: [id number] Date of Birth/Sex: [birth year] (74 y.o. M) Treating RN: Cornell Barman Primary Care Provider: [name] Other Clinician: Referring Provider: [name] Treating Provider/Extender: Skipper Cliche in Treatment: 3 History of Present Illness HPI Description: 10/18/17-He is here for initial evaluation of bilateral lower extremity ulcerations in the presence of venous insufficiency and lymphedema. He has been seen by vascular medicine in the past, Dr. Lucky Cowboy, last seen in 2016. He does have a history of abnormal ABIs, which is to be expected given his lymphedema and venous insufficiency. According to Epic, it appears that all attempts for arterial evaluation and/or angiography were not follow through with by patient. He does have a history of being seen in lymphedema clinic in 2018, stopped going approximately 6 months ago stating "it didn't do any good". He does not have lymphedema pumps, he does not have custom fit compression wrap/stockings. He is diabetic and his recent A1c last month was 7.6. He admits to chronic bilateral  lower extremity pain, no change in pain since blister and ulceration development. He is currently being treated with Levaquin for bronchitis. He has home health and we will continue. 10/25/17-He is here in follow-up evaluation for bilateral lower extremity ulcerationssubtle he remains on Levaquin for bronchitis. Right lower extremity with no evidence of drainage or ulceration, persistent left lower extremity ulceration. He states that home health has not been out since his appointment. He went to [facility] on Tuesday, studies revealed: RIGHT ABI 0.9, TBI 0.6 LEFT ABI 1.1, TBI 0.6 with triphasic flow bilaterally. We will continue his same treatment plan. He has been educated on compression therapy and need for elevation. He will benefit from lymphedema pumps 11/01/17-He is here in follow-up evaluation for left lower extremity ulcer. The right lower extremity remains healed. He has home health services, but they have not been out to see the patient for 2-3 weeks. He states it home health physical therapy changed his dressing yesterday after therapy; he placed Ace wrap compression. We are still waiting for lymphedema pumps, reordered d/t need for company change. 11/08/17-He is here in follow-up evaluation for left lower extremity ulcer. It is improved. Edema is significantly improved with compression therapy. We will continue with same treatment plan and he will follow-up next week. No word regarding lymphedema pumps 11/15/17-He is here in follow-up evaluation for left lower extremity ulcer. He is healed and will be discharged from wound care services. I have reached out to [facility] regarding his lymphedema pumps. They have been unable to reach the patient; the contact number they had with the patient's wife's cell phone and she has not answered any unrecognized calls. Contact should be made today, trial planned for next week; [facility] will continue to follow 11/27/17 on  evaluation today patient has multiple blistered areas over the right lower extremity his  left lower extremity appears to be doing okay. These blistered areas show signs of no infection which is great news. With that being said he did have some necrotic skin overlying which was mechanically debrided away with saline and gauze today without complication. Overall post debridement the wounds appear to be doing better but in general his swelling seems to be increased. This is obviously not good news. I think this is what has given rise to the blisters. 12/04/17 on evaluation today patient presents for follow-up concerning his bilateral lower extremity edema in the right lower extremity ulcers. He has been tolerating the dressing changes without complication. With that being said he has had no real issues with the wraps which is also good news. Overall I'm pleased with the progress he's been making. 12/11/17 on evaluation today patient appears to be doing rather well in regard to his right lateral lower extremity ulcer. He's been tolerating the dressing changes without complication. Fortunately there does not appear to be any evidence of infection at this time. Overall I'm pleased with the progress that is being made. Unfortunately he has been in the hospital due to having what sounds to be a stomach virus/flu fortunately that is starting to get better. 12/18/17 on evaluation today patient actually appears to be doing very well in regard to his bilateral lower extremities the swelling is under fairly good control his lymphedema pumps are still not up and running quite yet. With that being said he does have several areas of opening noted as far as wounds are concerned mainly over the left lower extremity. With that being said I do believe once he gets lymphedema pumps this would least help you mention some the fluid and preventing this from occurring. Hopefully that will be set up soon sleeves are Artie in place  at his home he just waiting for the machine. 12/25/17 on evaluation today patient actually appears to be doing excellent in fact all of his ulcers appear to have resolved his legs appear very well. I do think he needs compression stockings we have discussed this and they are actually going to go to [place] today to elastic therapy to get this fitted for him. I think that is definitely a good thing to do. Readmission: 04/09/18 upon evaluation today this patient is seen for readmission due to bilateral lower extremity lymphedema. He has significant swelling of his extremities especially on the left although the right is also swollen he has weeping from both sides. There are no obvious open wounds at this point. Fortunately he has been doing fairly well for quite a bit of time since I last saw him. Nonetheless unfortunately this seems to have reopened and is giving quite a bit of trouble. He states this began about a week ago when he first called [place] to get in to be seen. No fevers, chills, nausea, or vomiting noted at this time. He has not been using his lymphedema pumps due to the fact that they won't fit on his leg at this point likewise is also not been using his compression for essentially the same reason. 04/16/18 upon evaluation today patient actually appears to be doing a little better in regard to the fluid in his bilateral lower extremities. With that being said he's had three falls since I saw him last week. He also states that he's been feeling very poorly. I was concerned last week and feel like that the concern is still there as far as the congestion in his chest  is concerned he seems to be breathing about the same as last week but again he states he's very weak he's not even able to walk further than from the chair to the door. His wife had to buy a wheelchair just to be able to get them out of the house to get to the appointment today. This has me very concerned. 04/23/18 on evaluation  today patient actually appears to be doing much better than last week's evaluation. At that time actually had to transport him to the ER via EMS and he subsequently was admitted for acute pulmonary edema, acute renal failure, and acute congestive heart failure. Fortunately he is doing much better. Apparently they did dialyze him and were able to take off roughly 35 pounds of fluid. Nonetheless he is feeling much better both in regard to his breathing and he's able to get around much better at this time compared to previous. Overall I'm very Adam Merritt, Adam Merritt ([id number]) happy with how things are at this time. There does not appear to be any evidence of infection currently. No fevers, chills, nausea, or vomiting noted at this time. 04/30/2018 patient seen today for follow-up and management of bilateral lower extremity lymphedema. He did express being more sad today than usual due to the recent loss of his dog. He states that he has been compliant with using the lymphedema pumps. However he does admit a minute over the last 2-3 days he has not been using the pumps due to the recent loss of his dog. At this time there is no drainage or open wounds to his lower extremities. The left leg edema is measuring smaller today. Still has a significant amount of edema on bilateral lower extremities With dry flaky skin. He will be referred to the lymphedema clinic for further management. Will continue 3 layer compression wraps and follow-up in 1-2 weeks.Denies any pain, fever, chairs recently. No recent falls or injuries reported during this visit. 05/07/18 on evaluation today patient actually appears to be doing very well in regard to his lower extremities in general all things considered. With that being said he is having some pain in the legs just due to the amount of swelling. He does have an area where he had a blister on the left lateral lower extremity this is open at this point other than that there's nothing  else weeping at this time. 05/14/18 on evaluation today patient actually appears to be doing excellent all things considered in regard to his lower extremities. He still has a couple areas of weeping on each leg which has continued to be the issue for him. He does have an appointment with the lymphedema clinic although this isn't until February 2020. That was the earliest they had. In the meantime he has continued to tolerate the compression wraps without complication. 05/28/18 on evaluation today patient actually appears to be doing more poorly in regard to his left lower extremity where he has a wound open at this point. He also had a fall where he subsequently injured his right great toe which has led to an open wound at the site unfortunately. He has been tolerating the dressing changes without complication in general as far as the wraps are concerned that he has not been putting any dressing on the left 1st toe ulcer site. 06/11/18 on evaluation today patient appears to be doing much worse in regard to his bilateral lower extremity ulcers. He has been tolerating the dressing changes without complication although his legs  have not been wrapped more recently. Overall I am not very pleased with the way his legs appear. I do believe he needs to be back in compression wraps he still has not received his compression wraps from the [facility] hospital as of yet. 06/18/18 on evaluation today patient actually appears to be doing significantly better than last time I saw him. He has been tolerating the compression wraps without complication in the circumferential ulcers especially appear to be doing much better. His toe ulcer on the right in regard to the great toe is better although not as good as the legs in my pinion. No fevers chills noted 07/02/18 on evaluation today patient appears to be doing much better in regard to his lower extremity ulcers. Unfortunately since I last saw him he's had the distal portion of  his right great toe if he dated it sounds as if this actually went downhill very quickly. I had only seen him a few days prior and the toe did not appear to be infected at that point subsequently became infected very rapidly and it was decided by the surgeon that the distal portion of the toe needed to be removed. The patient seems to be doing well in this regard he tells me. With that being said his lower extremities are doing better from the standpoint of the wounds although he is significantly swollen at this point. 07/09/18 on evaluation today patient appears to be doing better in regard to the wounds on his lower extremities. In fact everything is almost completely healed he is just a small area on the left posterior lower extremity that is open at this point. 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 With that being said he needs some kind of compression it does sound like he may not be wearing his compression, that is the wraps, during the entire time between when he's here visit to visit. Apparently his wife took the current one off because it began to "fall apart". 07/16/18 on evaluation today patient appears to be extremely swollen especially in regard to his left lower extremity unfortunately. He also has a new skin tear over the left lower extremity and there's a smaller area on the right lower extremity as well. Unfortunately this seems to be due in part to blistering and fluid buildup in his leg. He did get the reduction wraps that were ordered by the [facility] hospital for him to go to lymphedema clinic. With that being said his wounds on the legs have not healed to the point to where they would likely accept them as a patient lymphedema clinic currently. We need to try to get this to heal. With that being said  he's been taking his wraps off which is not doing him any favors at this point. In fact this is probably quite counterproductive compared to what needs to occur. We will likely need to increase to a four layer compression wrap and continue to also utilize elevation and he has to keep the wraps on not take them off as he's been doing currently hasn't had a wrap on since Saturday. 07/23/18 on evaluation today patient appears to be doing much better in regard to his bilateral lower extremities. In fact his left lower extremity which was the largest is actually 15 cm smaller today compared to what it  was last time he was here in our clinic. This is obviously good news after just one week. Nonetheless the differences he actually kept the wraps on during the entire week this time. That's not typical for him. I do believe he understands a little bit better now the severity of the situation and why it's important for him to keep these wraps on. 07/30/18 on evaluation today patient actually appears to be doing rather well in regard to his lower extremities. His legs are much smaller than they have been in the past and he actually has only one very small rudder superficial region remaining that is not closed on the left lateral/posterior lower extremity even this is almost completely close. I do believe likely next week he will be healed without any complications. I do think we need to continue the wraps however this seems to be beneficial for him. I also think it may be a good time for [place] to go ahead and see about getting the appointment with the lymphedema clinic which is supposed to be made for him in order to keep this moving along and hopefully get them into compression wraps that will in the end help him to remain healed. 08/06/18 on evaluation today patient actually appears to be doing very well in regard as bilateral lower extremities. In fact his wounds appear to be completely healed at this time. He does  have bilateral lymphedema which has been extremely well controlled with the compression wraps. He is in the process of getting appointment with the lymphedema clinic we have made this referral were just waiting to hear back on the schedule time. We need to follow up on that today as well. 08/13/18 on evaluation today patient actually appears to be doing very well in regard to his bilateral lower extremities there are no open wounds at this point. We are gonna go ahead and see about ordering the Velcro compression wraps for him had a discussion with them about [place] doing it versus the [place] they feel like they can definitely afford going ahead and get the wraps themselves and they would prefer to try to avoid having to go to the lymphedema clinic if it all possible which I completely understand. As long as he has good compression I'm okay either way. 3/17//20 on evaluation today patient actually appears to be doing well in regard to his bilateral lower extremity ulcers. He has been tolerating the dressing changes without complication specifically the compression wraps. Overall is had no issues my fingers finding that I see at this point is that he is having trouble with constipation. He tells me he has not been able to go to the bathroom for about six days. He's taken to over-the- counter oral laxatives unfortunately this is not helping. He has not contacted his doctor. Adam Merritt, Adam Merritt ([id number]) 08/27/18 on evaluation today patient appears to be doing fairly well in regard to his lower extremities at this point. There does not appear to be any new altars is swelling is very well controlled. We are still waiting for his Velcro compression wraps to arrive that should be sometime in the next week is Artie sent the check for these. 09/03/18 on evaluation today patient appears to be doing excellent in regard to his bilateral lower extremities which he shows no signs of wound openings in regard to this point. He  does have his Velcro compression wraps which did arrive in the mail since I saw him last week. Overall he is  doing excellent in my pinion. 09/13/18 on evaluation today patient appears to be doing very well currently in regard to the overall appearance of his bilateral lower extremities although he's a little bit more swollen than last time we saw him. At that point he been discharged without any open wounds. Nonetheless he has a small open wound on the posterior left lower extremity with some evidence of cellulitis noted as well. Fortunately I feel like he has made good progress overall with regard to his lower extremities from were things used to be. 09/20/18 on evaluation today patient actually appears to be doing much better. The erythematous lower extremity is improving wound itself which is still open appears to be doing much better as far as but appearance as well as pain is concerned overall very pleased in this regard. There's no signs of active infection at this time. 09/27/18 on evaluation today patient's wounds on the lower extremity actually appear to be doing fairly well at this time which is good news. There is no evidence of active infection currently and again is just as left lower extremity were there any wounds at this point anyway. I believe they may be completely healed but again I'm not 100% sure based on evaluation today. I think one more week of observation would likely be a good idea. 10/04/18 on evaluation today patient actually appears to be doing excellent in regard to the left lower extremity which actually appears to be completely healed as of today. Unfortunately he's been having issues with his right lower extremity have a new wound that has opened. Fortunately there's no evidence of active infection at this time which is good news. 10/10/18 upon evaluation today patient actually appears to be doing a little bit worse with the new open area on his right posterior lower  extremity. He's been tolerating the dressing changes without complication. Right now we been using his compression wraps although I think we may need to switch back to actually performing bilateral compression wraps are in the clinic. No fevers, chills, nausea, or vomiting noted at this time. 10/17/18 on evaluation today patient actually appears to be doing quite well in regard to his bilateral lower extremity ulcers. He has been tolerating the reps without complication although he would prefer not to rewrap his legs as of today. Fortunately there's no signs of active infection which is good news. No fevers, chills, nausea, or vomiting noted at this time. 10/24/18 on evaluation today patient appears to be doing very well in regard to his lower extremities. His right lower extremity is shown signs of healing and his left lower Trinity though not healed appears to be improving which is excellent news. Overall very pleased with how things seem to be progressing at this time. The patient is likewise happy to hear this. His Velcro compression wraps however have not been put on properly will gonna show his wife how to do that properly today. 10/31/18 on evaluation today patient appears to be doing more poorly in regard to his left lower extremity in particular although both lower extremities actually are showing some signs of being worse than my previous evaluation. Unfortunately I'm just not sure that his compression stockings even with the use of the compression/lymphedema pumps seem to be controlling this well. Upon further questioning he tells me that he also is not able to lie flat in the bed due to his congestive heart failure and difficulty breathing. For that reason he sleeps in his recliner. To make  matters worse his recliner also cannot even hold his legs up so instead of even being somewhat elevated they pretty much hang down to the floor. This is the way he sleeps each night which is definitely  counterproductive to everything else that were attempting to do from the standpoint of controlling his fluid. Nonetheless I think that he potentially could benefit from a hospital bed although this would be something that his primary care provider would likely have to order since anything that is order on our side has to be directly related to wound care and again the hospital bed is not necessarily a direct relation to although I think it does contribute to his overall wound status on his lower extremities. 11/07/18 on evaluation today patient appears to be doing better in regard to his bilateral lower extremity. He's been tolerating the dressing changes without complication. We did in the interim since I last saw him switch to just using extras orbiting new alginate and that seems to have done much better for him. I'm very pleased with the overall progress that is made. 11/14/18 on evaluation today patient appears to be redoing rather well in regard to his left lower extremity ulcers which are the only ones remaining at this point. Fortunately there's no signs of active infection at this time which is good news. Overall been very pleased with how things seem to be progressing currently. No fevers, chills, nausea, or vomiting noted at this time. 11/20/17 on evaluation today patient actually appears to be doing quite well in regard to his lower extremities on the left on the right he has several blisters that showed up although there's some question about whether or not he has had his broker compression wraps on like he was supposed to or not. Fortunately there's no signs of active infection at this time which is good news. Unfortunately though he is doing well from the standpoint of the left leg the right leg is not doing as well again this is when we did not wrap last week. 11/28/18 on evaluation today patient appears to be doing well in regard to his bilateral lower extremities is left appears to be  healed is right is not healed but is very close to being so. Overall very pleased with how things seem to be progressing. Patient is likewise happy that things are doing well. 12/09/18 on evaluation today patient actually appears to be completely healed which is excellent news. He actually seems to be doing well in regard to the swelling of the bilateral lower extremities which is also great news. Overall very pleased with how things seem to be progressing. Readmission: 03/18/2019 upon evaluation today patient presents for reevaluation concerning issues that he is having with his left lower extremity where he does have a wound noted upon inspection today. He also has a wound on the right second toe on the tip where it is apparently been rubbing in his shoe I did look at issues and you can actually see where this has been occurring as well. Fortunately there is no signs of infection with regard to the toe necessarily although it is very swollen compared to normal that does have me somewhat concerned about the possibility of further evaluation for infection/osteomyelitis. I would recommend an x-ray to start with. Otherwise he states that it is been 1-2 weeks that he has had the draining in regard to left lower extremity he does not know how this happened he has been wearing his compression appropriately and  I do see that as well today but nonetheless I do think that he needs to continue to be very cautious with regard to elevation as well. Adam Merritt, Adam Merritt ([id number]) 03/25/2019 on evaluation today patient appears to be doing much better in regard to his lower extremity at this time. That is on the left. He did culture positive for Pseudomonas but the good news is he seems to be doing much better the gentamicin we applied topically seems to have done a great job for him. There is no signs of active infection at this time systemically and locally he is doing much better. No fevers, chills,  nausea, vomiting, or diarrhea. In regard to his right toe this seems to be doing much better there is very little pressure noted at this point I did clean up some of the callus today around the edges of the wound as well as the surface of the wound but to be honest he is progressing quite nicely. 10/30; the area on the left anterior lower tibia area is healed over. His edema control is adequate even though his use of the compression pumps seems very intermittent. He has a juxta lite stocking on the right leg and a think there is 1 for the left leg at home. His wife is putting these on. He has an area on the plantar right second toe which is a hammertoe they are trying to offload this 04/08/2019 on evaluation today patient appears to be doing well in regard to his lower extremities which is good news. We are seeing him today for his toe ulcer which is giving him a little bit more trouble but again still seems to be doing better at this point which is good news. He is going require some sharp debridement in regard to the toe today however. 04/15/2019 on evaluation today patient actually appears to be doing quite well with regard to his toe ulcer. I am very pleased with how things are doing in that regard. With regard to his lower extremity edema in general he tells me he is not been using the lymphedema pumps regularly although he does not use them. I think that he needs to use them more regularly he does have a small blister on the left lower extremity this is not open at this point but obviously it something that can get worse if he does not keep the compression going and the pumps going as well. 04/22/2019 on evaluation today patient appears to be doing well with regard to his toe ulcer. He has been tolerating the dressing changes without complication. This is measuring slightly smaller this week as compared to last week. Fortunately there is no signs of active infection at this time. 05/06/2019 on  evaluation today patient actually appears to be doing excellent. In fact his toe appears to be completely healed which is great news. There is no signs of active infection at this time. Readmission: Patient presents today for follow-up after having been discharged at the beginning of December 2020. He states that in recent weeks things have reopened and has been having more trouble at this point. With that being said fortunately there is no signs of active infection at this time. No fever chills noted. Nonetheless he also does have an issue with one of his toes as well that seems to be somewhat this is on the right foot second toe. 07/25/2019 upon evaluation today patient's lower extremities appear to be doing excellent bilaterally. I really feel like he  is showing signs of great improvement and in fact I think he is close to healing which is great news. There is no signs of active infection at this time. No fevers, chills, nausea, vomiting, or diarrhea. 07/31/2019 upon evaluation today patient appears to be doing excellent and in fact I think may be completely healed in regard to his right lower extremity that were still to monitor this 1 more week before closing it out. The left lower extremity is measuring smaller although not completely closed seems to be doing excellent. 08/07/2019 upon evaluation today patient appears to be doing excellent in regard to his wounds. In fact the right lower extremity is completely healed there is no issues here. The left lower extremity has 1 very small area still remaining fortunately there is no signs of infection and overall I think he is very close to closure of the here as well. 08/14/19 upon evaluation today patient appears to be doing excellent in regard to his lower extremities. In fact he appears to be completely healed as of today. Fortunately there's no signs of active infection at this time. No fevers, chills, nausea, or vomiting noted at this  time. READMISSION 09/26/2019 This is a 75 year old man who is well-known to this clinic having just been discharged on 08/14/2019. He has known lymphedema and chronic venous insufficiency. He has Farrow wrap stockings and also external compression pumps. He does not use the external compression pumps however. He does however apparently fairly faithfully use the stockings. They have not been lotion in his legs. He has developed new wounds on the right anterior as well as the left medial left lateral and left posterior calf. He returns for our review of this Past medical history includes coronary artery disease, hypertension, congestive heart failure, COPD, venous insufficiency with lymphedema, type 2 diabetes. He apparently has compression pumps, Farrow wraps and at 1 point a hospital bed although I believe he sleeps in a recliner 10/03/2019 upon evaluation today patient appears to be doing better with regard to his wounds in general. He has been tolerating the dressing changes without complication. Fortunately there is no signs of active infection. No fevers, chills, nausea, vomiting, or diarrhea. I do believe the compression wraps are helping as they always have in the past. 10/10/2019 upon evaluation today patient appears to be doing excellent at this point. His right lower extremity is measuring much better and in fact is completely healed. His left lower extremity is also measuring better though not healed seems to be doing excellent at this point which is great news. Overall I am very pleased with how things appear currently. 10/27/2019 upon evaluation today patient appears to be doing quite well with regard to his wounds currently. He has been tolerating the dressing changes without complication. Fortunately there is no signs of active infection at this time. In fact he is almost completely healed on the left and has just a very small area open and on the right he is completely healed. 11/04/2019  upon evaluation today patient appears to be doing quite well with regard to his wounds. In fact he appears to be quite possibly completely healed on the left lower extremity now as well the right lower extremity is still doing well. With that being said unfortunately though this may be healed I think it is a very fragile area and I cannot even confirm there is not a very small opening still remaining. I really think he may benefit from 1 additional weeks wrapping before  discontinuing. 11/10/2019 upon evaluation today patient appears to be doing well in regard to his original wounds in fact everything that we were treating last week is completely healed on the left. Unfortunately he has a new skin tear just above where the wrap slipped down due to a fall he sustained causing this injury. 11/17/2019 on evaluation today patient appears to be doing better with regard to his wound. He has been tolerating the dressing changes without Adam Merritt, Adam Merritt. ([id number]) complication. Fortunately there is no signs of active infection at this time. No fevers, chills, nausea, vomiting, or diarrhea. 11/24/2019 upon evaluation today patient appears to be doing well with regard to his wound. This is making good progress there does not appear to be signs of active infection but overall I do feel like he is headed in the correct direction. Overall he is tolerating the compression wrap as well. 12/02/2019 upon evaluation today patient appears to be doing well with regard to his leg ulcer. In fact this appears to be completely healed and this is the last of the wounds that he had but still the left anterior lower extremity. Fortunately there is no signs of active infection at this time. No fevers, chills, nausea, vomiting, or diarrhea. Readmission: 02/05/2020 on evaluation today patient appears to be doing well with regard to his wound all things considered. He actually tells me that a couple weeks ago he had trouble with his  wraps and therefore took him off for a couple of days in order to give his legs a break. It was during this time that he actually developed increased swelling and edema of his legs and blisters on both legs. That is when he called to make the appointment. Nonetheless since that time he began wearing his compression wraps which are Velcro in the meantime and has done extremely well with this. In fact his leg appears to be under good control as far as edema is concerned today it is nothing like what I am seeing when he has come in for evaluations in the past. They have been using some of the silver alginate at home which has been beneficial for him. Fortunately there is no signs of active infection at this time. No fevers, chills, nausea, vomiting, or diarrhea. 02/19/2020 on evaluation today patient unfortunately does not appear to be doing quite as well as I would like to see. He has no signs of active infection at this time but unfortunately he is not doing well in regard to the overall appearance of his left leg. He has a couple other areas that are weeping now and the leg is much more swollen. I think that he needs to actually have a compression wrap to try to manage this. 02/26/2020 on evaluation today patient appears to be doing well with regard to his left lower extremity ulcer. Fortunately the swelling is down I do believe the pressure wrap seems to be doing quite well. Fortunately there is no signs of active infection at this time. 03/05/2020 upon evaluation today patient appears to be doing excellent in regard to his legs at this point. Fortunately there is no signs of active infection which is great news. Overall he feels like he is completely healed which is great news. Readmission: 05/20/2020 upon evaluation today patient appears to be doing well with regard to his leg ulcer all things considered. He is actually coming back to see [place] after having had a reopening of the ulcers on his left leg  in  the past several weeks. He is out of dressing supplies and his wife is been try to take care of this as best she can. 06/10/2020 upon evaluation today patient unfortunately appears to be doing significantly worse today compared to when I last saw him. He and his wife both tell me that "there has been a lot going on". I did not actually go into detail about everything that has been happening but nonetheless he has not obviously been wearing his compression. He brings them with him today but they were not on. I am not even certain to be honest that he could get those on at this point. Nonetheless I do believe that he is going to require compression wrapping at some point shortly but right now we need to try to get what appears to be infection under control at this time. Electronic Signature(s) Signed: 06/10/2020 3:58:45 PM By: Worthy Keeler PA-C Entered By: Worthy Keeler on 06/10/2020 15:58:45 Adam Merritt ([id number]) -------------------------------------------------------------------------------- Physical Exam Details Patient Name: Adam Merritt Date of Service: 06/10/2020 12:45 PM Medical Record Number: [id number] Patient Account Number: [id number] Date of Birth/Sex: Dec 23, 1945 (75 y.o. M) Treating RN: Cornell Barman Primary Care Provider: [name] Other Clinician: Referring Provider: [name] Treating Provider/Extender: Jeri Cos [name]: 3 Constitutional Obese and well-hydrated in no acute distress. Respiratory normal breathing without difficulty. Psychiatric this patient is able to make decisions and demonstrates good insight into disease process. Alert and Oriented x 3. pleasant and cooperative. Notes Upon inspection patient's wound bed actually showed signs of fairly good epithelialization at this time. There did not appear to be any evidence of active infection systemically although locally I do feel like he has cellulitis of the left leg I did not really notice  as much on the right leg. With that being said I do believe he is going require some compression wrapping but I really need to get his infection under control first. For that reason we will probably just use an Ace wrap today. Electronic Signature(s) Signed: 06/10/2020 3:59:21 PM By: Worthy Keeler PA-C Entered By: Worthy Keeler on 06/10/2020 15:59:21 Adam Merritt ([id number]) -------------------------------------------------------------------------------- Physician Orders Details Patient Name: Adam Merritt Date of Service: 06/10/2020 12:45 PM Medical Record Number: [id number] Patient Account Number: [id number] Date of Birth/Sex: [birth year] (74 y.o. M) Treating RN: Cornell Barman Primary Care Provider: [name] Other Clinician: Referring Provider: [name] Treating Provider/Extender: Skipper Cliche in Treatment: 3 Verbal / Phone Orders: No Diagnosis Coding ICD-10 Coding Code Description I89.0 Lymphedema, not elsewhere classified I87.2 Venous insufficiency (chronic) (peripheral) L97.822 Non-pressure chronic ulcer of other part of left lower leg with fat layer exposed Follow-up Appointments o Return Appointment in 1 week. o Other: - Monday, January 10th for dressing change Edema Control - Lymphedema / Segmental Compressive Device / Other Bilateral Lower Extremities o Elevate legs to the level of the heart and pump ankles as often as possible o Elevate leg(s) parallel to the floor when sitting. Non-Wound Condition Bilateral Lower Extremities o Cleanse affected area with antibacterial soap and water, o Apply appropriate compression. - Applied Xtra sorb to draining areas, secured with kerlix. Ace wraps used due to redness and swelling. Wound Treatment Laboratory o Bacteria identified in Wound by Culture (MICRO) - Left lower leg oooo LOINC Code: 6295-2 oooo Convenience Name: Wound culture routine Patient Medications Allergies: Corticosteroids  (Glucocorticoids) Notifications Medication Indication Start End Bactrim DS 06/10/2020 DOSE 1 - oral 800 mg-160 mg tablet - 1  tablet oral taken 2 times per day for 14 days. Do not take your potassium while on this medication Electronic Signature(s) Signed: 06/10/2020 5:38:03 PM By: Gretta Cool, BSN, RN, CWS, Kim RN, BSN Signed: 06/11/2020 4:17:09 PM By: Worthy Keeler PA-C Previous Signature: 06/10/2020 5:00:10 PM Version By: Worthy Keeler PA-C Previous Signature: 06/10/2020 4:52:36 PM Version By: Gretta Cool, BSN, RN, CWS, Kim RN, BSN Entered By: Gretta Cool, BSN, RN, CWS, Kim on 06/10/2020 17:38:02 Adam Merritt ([id number]) -------------------------------------------------------------------------------- Problem List Details Patient Name: YAMIL, OELKE Date of Service: 06/10/2020 12:45 PM Medical Record Number: [id number] Patient Account Number: [id number] Date of Birth/Sex: [birth year] (74 y.o. M) Treating RN: Cornell Barman Primary Care Provider: [name] Other Clinician: Referring Provider: [name] Treating Provider/Extender: Skipper Cliche in Treatment: 3 Active Problems ICD-10 Encounter Code Description Active Date MDM Diagnosis I89.0 Lymphedema, not elsewhere classified 05/20/2020 No Yes I87.2 Venous insufficiency (chronic) (peripheral) 05/20/2020 No Yes L97.822 Non-pressure chronic ulcer of other part of left lower leg with fat layer 05/20/2020 No Yes exposed Inactive Problems Resolved Problems Electronic Signature(s) Signed: 06/10/2020 1:12:15 PM By: Worthy Keeler PA-C Entered By: Worthy Keeler on 06/10/2020 13:12:13 Adam Merritt ([id number]) -------------------------------------------------------------------------------- Progress Note Details Patient Name: Adam Merritt Date of Service: 06/10/2020 12:45 PM Medical Record Number: [id number] Patient Account Number: [id number] Date of Birth/Sex: [birth year] (74 y.o. M) Treating RN: Cornell Barman Primary Care Provider: [name] Other  Clinician: Referring Provider: [name] Treating Provider/Extender: Skipper Cliche in Treatment: 3 Subjective Chief Complaint Information obtained from Patient Left leg ulcers History of Present Illness (HPI) 10/18/17-He is here for initial evaluation of bilateral lower extremity ulcerations in the presence of venous insufficiency and lymphedema. He has been seen by vascular medicine in the past, Dr. Lucky Cowboy, last seen in 2016. He does have a history of abnormal ABIs, which is to be expected given his lymphedema and venous insufficiency. According to Epic, it appears that all attempts for arterial evaluation and/or angiography were not follow through with by patient. He does have a history of being seen in lymphedema clinic in 2018, stopped going approximately 6 months ago stating "it didn't do any good". He does not have lymphedema pumps, he does not have custom fit compression wrap/stockings. He is diabetic and his recent A1c last month was 7.6. He admits to chronic bilateral lower extremity pain, no change in pain since blister and ulceration development. He is currently being treated with Levaquin for bronchitis. He has home health and we will continue. 10/25/17-He is here in follow-up evaluation for bilateral lower extremity ulcerationssubtle he remains on Levaquin for bronchitis. Right lower extremity with no evidence of drainage or ulceration, persistent left lower extremity ulceration. He states that home health has not been out since his appointment. He went to [facility] on Tuesday, studies revealed: RIGHT ABI 0.9, TBI 0.6 LEFT ABI 1.1, TBI 0.6 with triphasic flow bilaterally. We will continue his same treatment plan. He has been educated on compression therapy and need for elevation. He will benefit from lymphedema pumps 11/01/17-He is here in follow-up evaluation for left lower extremity ulcer. The right lower extremity remains healed. He has home health services, but  they have not been out to see the patient for 2-3 weeks. He states it home health physical therapy changed his dressing yesterday after therapy; he placed Ace wrap compression. We are still waiting for lymphedema pumps, reordered d/t need for company change. 11/08/17-He is here in follow-up evaluation for left  lower extremity ulcer. It is improved. Edema is significantly improved with compression therapy. We will continue with same treatment plan and he will follow-up next week. No word regarding lymphedema pumps 11/15/17-He is here in follow-up evaluation for left lower extremity ulcer. He is healed and will be discharged from wound care services. I have reached out to [facility] regarding his lymphedema pumps. They have been unable to reach the patient; the contact number they had with the patient's wife's cell phone and she has not answered any unrecognized calls. Contact should be made today, trial planned for next week; [facility] will continue to follow 11/27/17 on evaluation today patient has multiple blistered areas over the right lower extremity his left lower extremity appears to be doing okay. These blistered areas show signs of no infection which is great news. With that being said he did have some necrotic skin overlying which was mechanically debrided away with saline and gauze today without complication. Overall post debridement the wounds appear to be doing better but in general his swelling seems to be increased. This is obviously not good news. I think this is what has given rise to the blisters. 12/04/17 on evaluation today patient presents for follow-up concerning his bilateral lower extremity edema in the right lower extremity ulcers. He has been tolerating the dressing changes without complication. With that being said he has had no real issues with the wraps which is also good news. Overall I'm pleased with the progress he's been making. 12/11/17 on evaluation today  patient appears to be doing rather well in regard to his right lateral lower extremity ulcer. He's been tolerating the dressing changes without complication. Fortunately there does not appear to be any evidence of infection at this time. Overall I'm pleased with the progress that is being made. Unfortunately he has been in the hospital due to having what sounds to be a stomach virus/flu fortunately that is starting to get better. 12/18/17 on evaluation today patient actually appears to be doing very well in regard to his bilateral lower extremities the swelling is under fairly good control his lymphedema pumps are still not up and running quite yet. With that being said he does have several areas of opening noted as far as wounds are concerned mainly over the left lower extremity. With that being said I do believe once he gets lymphedema pumps this would least help you mention some the fluid and preventing this from occurring. Hopefully that will be set up soon sleeves are Artie in place at his home he just waiting for the machine. 12/25/17 on evaluation today patient actually appears to be doing excellent in fact all of his ulcers appear to have resolved his legs appear very well. I do think he needs compression stockings we have discussed this and they are actually going to go to [facility] today to elastic therapy to get this fitted for him. I think that is definitely a good thing to do. Readmission: 04/09/18 upon evaluation today this patient is seen for readmission due to bilateral lower extremity lymphedema. He has significant swelling of his extremities especially on the left although the right is also swollen he has weeping from both sides. There are no obvious open wounds at this point. Fortunately he has been doing fairly well for quite a bit of time since I last saw him. Nonetheless unfortunately this seems to have reopened and is giving quite a bit of trouble. He states this began about a week  ago when  he first called [place] to get in to be seen. No fevers, chills, nausea, or vomiting noted at this time. He has not been using his lymphedema pumps due to the fact that they won't fit on his leg at this point likewise is also not been using his compression for essentially the same reason. 04/16/18 upon evaluation today patient actually appears to be doing a little better in regard to the fluid in his bilateral lower extremities. With that being said he's had three falls since I saw him last week. He also states that he's been feeling very poorly. I was concerned last week and feel like that the concern is still there as far as the congestion in his chest is concerned he seems to be breathing about the same as last week but again he states he's very weak he's not even able to walk further than from the chair to the door. His wife had to buy a wheelchair just to be able to get them out of the house to get to the appointment today. This has me very concerned. DIYARI, CHERNE ([id number]) 04/23/18 on evaluation today patient actually appears to be doing much better than last week's evaluation. At that time actually had to transport him to the ER via EMS and he subsequently was admitted for acute pulmonary edema, acute renal failure, and acute congestive heart failure. Fortunately he is doing much better. Apparently they did dialyze him and were able to take off roughly 35 pounds of fluid. Nonetheless he is feeling much better both in regard to his breathing and he's able to get around much better at this time compared to previous. Overall I'm very happy with how things are at this time. There does not appear to be any evidence of infection currently. No fevers, chills, nausea, or vomiting noted at this time. 04/30/2018 patient seen today for follow-up and management of bilateral lower extremity lymphedema. He did express being more sad today than usual due to the recent loss of his dog. He states that  he has been compliant with using the lymphedema pumps. However he does admit a minute over the last 2-3 days he has not been using the pumps due to the recent loss of his dog. At this time there is no drainage or open wounds to his lower extremities. The left leg edema is measuring smaller today. Still has a significant amount of edema on bilateral lower extremities With dry flaky skin. He will be referred to the lymphedema clinic for further management. Will continue 3 layer compression wraps and follow-up in 1-2 weeks.Denies any pain, fever, chairs recently. No recent falls or injuries reported during this visit. 05/07/18 on evaluation today patient actually appears to be doing very well in regard to his lower extremities in general all things considered. With that being said he is having some pain in the legs just due to the amount of swelling. He does have an area where he had a blister on the left lateral lower extremity this is open at this point other than that there's nothing else weeping at this time. 05/14/18 on evaluation today patient actually appears to be doing excellent all things considered in regard to his lower extremities. He still has a couple areas of weeping on each leg which has continued to be the issue for him. He does have an appointment with the lymphedema clinic although this isn't until February 2020. That was the earliest they had. In the meantime he has continued to  tolerate the compression wraps without complication. 05/28/18 on evaluation today patient actually appears to be doing more poorly in regard to his left lower extremity where he has a wound open at this point. He also had a fall where he subsequently injured his right great toe which has led to an open wound at the site unfortunately. He has been tolerating the dressing changes without complication in general as far as the wraps are concerned that he has not been putting any dressing on the left 1st toe ulcer  site. 06/11/18 on evaluation today patient appears to be doing much worse in regard to his bilateral lower extremity ulcers. He has been tolerating the dressing changes without complication although his legs have not been wrapped more recently. Overall I am not very pleased with the way his legs appear. I do believe he needs to be back in compression wraps he still has not received his compression wraps from the [facility] hospital as of yet. 06/18/18 on evaluation today patient actually appears to be doing significantly better than last time I saw him. He has been tolerating the compression wraps without complication in the circumferential ulcers especially appear to be doing much better. His toe ulcer on the right in regard to the great toe is better although not as good as the legs in my pinion. No fevers chills noted 07/02/18 on evaluation today patient appears to be doing much better in regard to his lower extremity ulcers. Unfortunately since I last saw him he's had the distal portion of his right great toe if he dated it sounds as if this actually went downhill very quickly. I had only seen him a few days prior and the toe did not appear to be infected at that point subsequently became infected very rapidly and it was decided by the surgeon that the distal portion of the toe needed to be removed. The patient seems to be doing well in this regard he tells me. With that being said his lower extremities are doing better from the standpoint of the wounds although he is significantly swollen at this point. 07/09/18 on evaluation today patient appears to be doing better in regard to the wounds on his lower extremities. In fact everything is almost completely healed he is just a small area on the left posterior lower extremity that is open at this point. 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 With that being said he needs some kind of compression it does sound like he may not be wearing his compression, that is the wraps, during the entire time between when he's here visit to visit. Apparently his wife took the current one off because it began to "fall apart". 07/16/18 on evaluation today patient appears to be extremely swollen especially in regard to his left lower extremity unfortunately. He also has a new skin tear over the left lower extremity and there's a smaller area on the right lower extremity as well. Unfortunately this seems to be due in part to blistering and fluid buildup in his leg. He did get the reduction wraps that were ordered by the [facility] hospital for him to go to lymphedema clinic. With that being said his wounds on the legs have not healed to the point to where they would likely accept them as a patient lymphedema clinic currently.  We need to try to get this to heal. With that being said he's been taking his wraps off which is not doing him any favors at this point. In fact this is probably quite counterproductive compared to what needs to occur. We will likely need to increase to a four layer compression wrap and continue to also utilize elevation and he has to keep the wraps on not take them off as he's been doing currently hasn't had a wrap on since Saturday. 07/23/18 on evaluation today patient appears to be doing much better in regard to his bilateral lower extremities. In fact his left lower extremity which was the largest is actually 15 cm smaller today compared to what it was last time he was here in our clinic. This is obviously good news after just one week. Nonetheless the differences he actually kept the wraps on during the entire week this time. That's not typical for him. I do believe he understands a little bit better now the severity of the situation and why it's important for him to keep these  wraps on. 07/30/18 on evaluation today patient actually appears to be doing rather well in regard to his lower extremities. His legs are much smaller than they have been in the past and he actually has only one very small rudder superficial region remaining that is not closed on the left lateral/posterior lower extremity even this is almost completely close. I do believe likely next week he will be healed without any complications. I do think we need to continue the wraps however this seems to be beneficial for him. I also think it may be a good time for [place] to go ahead and see about getting the appointment with the lymphedema clinic which is supposed to be made for him in order to keep this moving along and hopefully get them into compression wraps that will in the end help him to remain healed. 08/06/18 on evaluation today patient actually appears to be doing very well in regard as bilateral lower extremities. In fact his wounds appear to be completely healed at this time. 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 He is in the process of getting appointment with the lymphedema clinic we have made this referral were just waiting to hear back on the schedule time. We need to follow up on that today as well. 08/13/18 on evaluation today patient actually appears to be doing very well in regard to his bilateral lower extremities there are no open wounds at this point. We are gonna go ahead and see about ordering the Velcro compression wraps for him had a discussion with them about [place] doing it versus the [place] they feel like they can definitely afford going ahead and get the wraps themselves and they would prefer to try to avoid having to go to the lymphedema clinic if it all possible which I completely understand. As long as he has good compression I'm okay either way. Adam Merritt, Adam Merritt ([id number]) 3/17//20 on evaluation today patient actually appears  to be doing well in regard to his bilateral lower extremity ulcers. He has been tolerating the dressing changes without complication specifically the compression wraps. Overall is had no issues my fingers finding that I see at this point is that he is having trouble with constipation. He tells me he has not been able to go to the bathroom for about six days. He's taken to over-the- counter oral laxatives unfortunately this  is not helping. He has not contacted his doctor. 08/27/18 on evaluation today patient appears to be doing fairly well in regard to his lower extremities at this point. There does not appear to be any new altars is swelling is very well controlled. We are still waiting for his Velcro compression wraps to arrive that should be sometime in the next week is Artie sent the check for these. 09/03/18 on evaluation today patient appears to be doing excellent in regard to his bilateral lower extremities which he shows no signs of wound openings in regard to this point. He does have his Velcro compression wraps which did arrive in the mail since I saw him last week. Overall he is doing excellent in my pinion. 09/13/18 on evaluation today patient appears to be doing very well currently in regard to the overall appearance of his bilateral lower extremities although he's a little bit more swollen than last time we saw him. At that point he been discharged without any open wounds. Nonetheless he has a small open wound on the posterior left lower extremity with some evidence of cellulitis noted as well. Fortunately I feel like he has made good progress overall with regard to his lower extremities from were things used to be. 09/20/18 on evaluation today patient actually appears to be doing much better. The erythematous lower extremity is improving wound itself which is still open appears to be doing much better as far as but appearance as well as pain is concerned overall very pleased in this regard.  There's no signs of active infection at this time. 09/27/18 on evaluation today patient's wounds on the lower extremity actually appear to be doing fairly well at this time which is good news. There is no evidence of active infection currently and again is just as left lower extremity were there any wounds at this point anyway. I believe they may be completely healed but again I'm not 100% sure based on evaluation today. I think one more week of observation would likely be a good idea. 10/04/18 on evaluation today patient actually appears to be doing excellent in regard to the left lower extremity which actually appears to be completely healed as of today. Unfortunately he's been having issues with his right lower extremity have a new wound that has opened. Fortunately there's no evidence of active infection at this time which is good news. 10/10/18 upon evaluation today patient actually appears to be doing a little bit worse with the new open area on his right posterior lower extremity. He's been tolerating the dressing changes without complication. Right now we been using his compression wraps although I think we may need to switch back to actually performing bilateral compression wraps are in the clinic. No fevers, chills, nausea, or vomiting noted at this time. 10/17/18 on evaluation today patient actually appears to be doing quite well in regard to his bilateral lower extremity ulcers. He has been tolerating the reps without complication although he would prefer not to rewrap his legs as of today. Fortunately there's no signs of active infection which is good news. No fevers, chills, nausea, or vomiting noted at this time. 10/24/18 on evaluation today patient appears to be doing very well in regard to his lower extremities. His right lower extremity is shown signs of healing and his left lower Trinity though not healed appears to be improving which is excellent news. Overall very pleased with how  things seem to be progressing at this time. The patient is  likewise happy to hear this. His Velcro compression wraps however have not been put on properly will gonna show his wife how to do that properly today. 10/31/18 on evaluation today patient appears to be doing more poorly in regard to his left lower extremity in particular although both lower extremities actually are showing some signs of being worse than my previous evaluation. Unfortunately I'm just not sure that his compression stockings even with the use of the compression/lymphedema pumps seem to be controlling this well. Upon further questioning he tells me that he also is not able to lie flat in the bed due to his congestive heart failure and difficulty breathing. For that reason he sleeps in his recliner. To make matters worse his recliner also cannot even hold his legs up so instead of even being somewhat elevated they pretty much hang down to the floor. This is the way he sleeps each night which is definitely counterproductive to everything else that were attempting to do from the standpoint of controlling his fluid. Nonetheless I think that he potentially could benefit from a hospital bed although this would be something that his primary care provider would likely have to order since anything that is order on our side has to be directly related to wound care and again the hospital bed is not necessarily a direct relation to although I think it does contribute to his overall wound status on his lower extremities. 11/07/18 on evaluation today patient appears to be doing better in regard to his bilateral lower extremity. He's been tolerating the dressing changes without complication. We did in the interim since I last saw him switch to just using extras orbiting new alginate and that seems to have done much better for him. I'm very pleased with the overall progress that is made. 11/14/18 on evaluation today patient appears to be redoing  rather well in regard to his left lower extremity ulcers which are the only ones remaining at this point. Fortunately there's no signs of active infection at this time which is good news. Overall been very pleased with how things seem to be progressing currently. No fevers, chills, nausea, or vomiting noted at this time. 11/20/17 on evaluation today patient actually appears to be doing quite well in regard to his lower extremities on the left on the right he has several blisters that showed up although there's some question about whether or not he has had his broker compression wraps on like he was supposed to or not. Fortunately there's no signs of active infection at this time which is good news. Unfortunately though he is doing well from the standpoint of the left leg the right leg is not doing as well again this is when we did not wrap last week. 11/28/18 on evaluation today patient appears to be doing well in regard to his bilateral lower extremities is left appears to be healed is right is not healed but is very close to being so. Overall very pleased with how things seem to be progressing. Patient is likewise happy that things are doing well. 12/09/18 on evaluation today patient actually appears to be completely healed which is excellent news. He actually seems to be doing well in regard to the swelling of the bilateral lower extremities which is also great news. Overall very pleased with how things seem to be progressing. Readmission: 03/18/2019 upon evaluation today patient presents for reevaluation concerning issues that he is having with his left lower extremity where he does have a wound  noted upon inspection today. He also has a wound on the right second toe on the tip where it is apparently been rubbing in his shoe I did look at issues and you can actually see where this has been occurring as well. Fortunately there is no signs of infection with regard to Adam Merritt, Adam Merritt. ([id number]) the  toe necessarily although it is very swollen compared to normal that does have me somewhat concerned about the possibility of further evaluation for infection/osteomyelitis. I would recommend an x-ray to start with. Otherwise he states that it is been 1-2 weeks that he has had the draining in regard to left lower extremity he does not know how this happened he has been wearing his compression appropriately and I do see that as well today but nonetheless I do think that he needs to continue to be very cautious with regard to elevation as well. 03/25/2019 on evaluation today patient appears to be doing much better in regard to his lower extremity at this time. That is on the left. He did culture positive for Pseudomonas but the good news is he seems to be doing much better the gentamicin we applied topically seems to have done a great job for him. There is no signs of active infection at this time systemically and locally he is doing much better. No fevers, chills, nausea, vomiting, or diarrhea. In regard to his right toe this seems to be doing much better there is very little pressure noted at this point I did clean up some of the callus today around the edges of the wound as well as the surface of the wound but to be honest he is progressing quite nicely. 10/30; the area on the left anterior lower tibia area is healed over. His edema control is adequate even though his use of the compression pumps seems very intermittent. He has a juxta lite stocking on the right leg and a think there is 1 for the left leg at home. His wife is putting these on. He has an area on the plantar right second toe which is a hammertoe they are trying to offload this 04/08/2019 on evaluation today patient appears to be doing well in regard to his lower extremities which is good news. We are seeing him today for his toe ulcer which is giving him a little bit more trouble but again still seems to be doing better at this point which  is good news. He is going require some sharp debridement in regard to the toe today however. 04/15/2019 on evaluation today patient actually appears to be doing quite well with regard to his toe ulcer. I am very pleased with how things are doing in that regard. With regard to his lower extremity edema in general he tells me he is not been using the lymphedema pumps regularly although he does not use them. I think that he needs to use them more regularly he does have a small blister on the left lower extremity this is not open at this point but obviously it something that can get worse if he does not keep the compression going and the pumps going as well. 04/22/2019 on evaluation today patient appears to be doing well with regard to his toe ulcer. He has been tolerating the dressing changes without complication. This is measuring slightly smaller this week as compared to last week. Fortunately there is no signs of active infection at this time. 05/06/2019 on evaluation today patient actually appears to be doing  excellent. In fact his toe appears to be completely healed which is great news. There is no signs of active infection at this time. Readmission: Patient presents today for follow-up after having been discharged at the beginning of December 2020. He states that in recent weeks things have reopened and has been having more trouble at this point. With that being said fortunately there is no signs of active infection at this time. No fever chills noted. Nonetheless he also does have an issue with one of his toes as well that seems to be somewhat this is on the right foot second toe. 07/25/2019 upon evaluation today patient's lower extremities appear to be doing excellent bilaterally. I really feel like he is showing signs of great improvement and in fact I think he is close to healing which is great news. There is no signs of active infection at this time. No fevers, chills, nausea, vomiting, or  diarrhea. 07/31/2019 upon evaluation today patient appears to be doing excellent and in fact I think may be completely healed in regard to his right lower extremity that were still to monitor this 1 more week before closing it out. The left lower extremity is measuring smaller although not completely closed seems to be doing excellent. 08/07/2019 upon evaluation today patient appears to be doing excellent in regard to his wounds. In fact the right lower extremity is completely healed there is no issues here. The left lower extremity has 1 very small area still remaining fortunately there is no signs of infection and overall I think he is very close to closure of the here as well. 08/14/19 upon evaluation today patient appears to be doing excellent in regard to his lower extremities. In fact he appears to be completely healed as of today. Fortunately there's no signs of active infection at this time. No fevers, chills, nausea, or vomiting noted at this time. READMISSION 09/26/2019 This is a 75 year old man who is well-known to this clinic having just been discharged on 08/14/2019. He has known lymphedema and chronic venous insufficiency. He has Farrow wrap stockings and also external compression pumps. He does not use the external compression pumps however. He does however apparently fairly faithfully use the stockings. They have not been lotion in his legs. He has developed new wounds on the right anterior as well as the left medial left lateral and left posterior calf. He returns for our review of this Past medical history includes coronary artery disease, hypertension, congestive heart failure, COPD, venous insufficiency with lymphedema, type 2 diabetes. He apparently has compression pumps, Farrow wraps and at 1 point a hospital bed although I believe he sleeps in a recliner 10/03/2019 upon evaluation today patient appears to be doing better with regard to his wounds in general. He has been tolerating  the dressing changes without complication. Fortunately there is no signs of active infection. No fevers, chills, nausea, vomiting, or diarrhea. I do believe the compression wraps are helping as they always have in the past. 10/10/2019 upon evaluation today patient appears to be doing excellent at this point. His right lower extremity is measuring much better and in fact is completely healed. His left lower extremity is also measuring better though not healed seems to be doing excellent at this point which is great news. Overall I am very pleased with how things appear currently. 10/27/2019 upon evaluation today patient appears to be doing quite well with regard to his wounds currently. He has been tolerating the dressing changes without complication. Fortunately  there is no signs of active infection at this time. In fact he is almost completely healed on the left and has just a very small area open and on the right he is completely healed. 11/04/2019 upon evaluation today patient appears to be doing quite well with regard to his wounds. In fact he appears to be quite possibly completely healed on the left lower extremity now as well the right lower extremity is still doing well. With that being said unfortunately though this may be healed I think it is a very fragile area and I cannot even confirm there is not a very small opening still remaining. I really think he may benefit from 1 additional weeks wrapping before discontinuing. Adam Merritt, Adam Merritt ([id number]) 11/10/2019 upon evaluation today patient appears to be doing well in regard to his original wounds in fact everything that we were treating last week is completely healed on the left. Unfortunately he has a new skin tear just above where the wrap slipped down due to a fall he sustained causing this injury. 11/17/2019 on evaluation today patient appears to be doing better with regard to his wound. He has been tolerating the dressing changes  without complication. Fortunately there is no signs of active infection at this time. No fevers, chills, nausea, vomiting, or diarrhea. 11/24/2019 upon evaluation today patient appears to be doing well with regard to his wound. This is making good progress there does not appear to be signs of active infection but overall I do feel like he is headed in the correct direction. Overall he is tolerating the compression wrap as well. 12/02/2019 upon evaluation today patient appears to be doing well with regard to his leg ulcer. In fact this appears to be completely healed and this is the last of the wounds that he had but still the left anterior lower extremity. Fortunately there is no signs of active infection at this time. No fevers, chills, nausea, vomiting, or diarrhea. Readmission: 02/05/2020 on evaluation today patient appears to be doing well with regard to his wound all things considered. He actually tells me that a couple weeks ago he had trouble with his wraps and therefore took him off for a couple of days in order to give his legs a break. It was during this time that he actually developed increased swelling and edema of his legs and blisters on both legs. That is when he called to make the appointment. Nonetheless since that time he began wearing his compression wraps which are Velcro in the meantime and has done extremely well with this. In fact his leg appears to be under good control as far as edema is concerned today it is nothing like what I am seeing when he has come in for evaluations in the past. They have been using some of the silver alginate at home which has been beneficial for him. Fortunately there is no signs of active infection at this time. No fevers, chills, nausea, vomiting, or diarrhea. 02/19/2020 on evaluation today patient unfortunately does not appear to be doing quite as well as I would like to see. He has no signs of active infection at this time but unfortunately he is not  doing well in regard to the overall appearance of his left leg. He has a couple other areas that are weeping now and the leg is much more swollen. I think that he needs to actually have a compression wrap to try to manage this. 02/26/2020 on evaluation today patient appears to  be doing well with regard to his left lower extremity ulcer. Fortunately the swelling is down I do believe the pressure wrap seems to be doing quite well. Fortunately there is no signs of active infection at this time. 03/05/2020 upon evaluation today patient appears to be doing excellent in regard to his legs at this point. Fortunately there is no signs of active infection which is great news. Overall he feels like he is completely healed which is great news. Readmission: 05/20/2020 upon evaluation today patient appears to be doing well with regard to his leg ulcer all things considered. He is actually coming back to see [place] after having had a reopening of the ulcers on his left leg in the past several weeks. He is out of dressing supplies and his wife is been try to take care of this as best she can. 06/10/2020 upon evaluation today patient unfortunately appears to be doing significantly worse today compared to when I last saw him. He and his wife both tell me that "there has been a lot going on". I did not actually go into detail about everything that has been happening but nonetheless he has not obviously been wearing his compression. He brings them with him today but they were not on. I am not even certain to be honest that he could get those on at this point. Nonetheless I do believe that he is going to require compression wrapping at some point shortly but right now we need to try to get what appears to be infection under control at this time. Objective Constitutional Obese and well-hydrated in no acute distress. Vitals Time Taken: 1:05 PM, Height: 66 in, Weight: 288 lbs, BMI: 46.5, Temperature: 98.0 F, Pulse: 68 bpm,  Respiratory Rate: 20 breaths/min, Blood Pressure: 128/70 mmHg. Respiratory normal breathing without difficulty. Psychiatric this patient is able to make decisions and demonstrates good insight into disease process. Alert and Oriented x 3. pleasant and cooperative. General Notes: Upon inspection patient's wound bed actually showed signs of fairly good epithelialization at this time. There did not appear to be any evidence of active infection systemically although locally I do feel like he has cellulitis of the left leg I did not really notice as much on the right leg. With that being said I do believe he is going require some compression wrapping but I really need to get his infection under control first. For that reason we will probably just use an Ace wrap today. Other Condition(s) Patient presents with Lymphedema located on the Bilateral Leg. General Notes: Patient has bilateral lymphedema with weeping areas. Adam Merritt, Adam Merritt ([id number]) Assessment Active Problems ICD-10 Lymphedema, not elsewhere classified Venous insufficiency (chronic) (peripheral) Non-pressure chronic ulcer of other part of left lower leg with fat layer exposed Plan Follow-up Appointments: Return Appointment in 1 week. Other: - Monday, January 10th for dressing change Edema Control - Lymphedema / Segmental Compressive Device / Other: Elevate legs to the level of the heart and pump ankles as often as possible Elevate leg(s) parallel to the floor when sitting. Non-Wound Condition: Cleanse affected area with antibacterial soap and water, Apply appropriate compression. - Applied Xtra sorb to draining areas, secured with kerlix. Ace wraps used due to redness and swelling. Laboratory ordered were: Wound culture routine The following medication(s) was prescribed: Bactrim DS oral 800 mg-160 mg tablet 1 1 tablet oral taken 2 times per day for 14 days. Do not take your potassium while on this medication starting  06/10/2020 1. I would recommend initiation  of treatment with XtraSorb followed by roll gauze and Ace wraps in order to try to help with some of the compression control. This is something that can be removed by the patient if needed over the weekend until we see him back on Monday. 2. I also obtained a wound culture today and depending on the results of the culture we will initiate more specific treatment or any changes in treatment necessary at that time. 3. I am also can recommend that the patient try to elevate his legs much as possible. Obviously I think that is of utmost importance for him to try to elevate and keep the edema down. Obviously the more he can do that the better. We will see patient back for reevaluation in 1 week here in the clinic. If anything worsens or changes patient will contact our office for additional recommendations. Electronic Signature(s) Signed: 06/10/2020 5:00:39 PM By: Worthy Keeler PA-C Previous Signature: 06/10/2020 4:00:32 PM Version By: Worthy Keeler PA-C Entered By: Worthy Keeler on 06/10/2020 17:00:38 Adam Merritt ([id number]) -------------------------------------------------------------------------------- [facility]Bill Details Patient Name: Adam Merritt Date of Service: 06/10/2020 Medical Record Number: [id number] Patient Account Number: [id number] Date of Birth/Sex: Dec 26, 1945 (74 y.o. M) Treating RN: Cornell Barman Primary Care Provider: [name] Other Clinician: Referring Provider: [name] Treating Provider/Extender: Skipper Cliche in Treatment: 3 Diagnosis Coding ICD-10 Codes Code Description I89.0 Lymphedema, not elsewhere classified I87.2 Venous insufficiency (chronic) (peripheral) L97.822 Non-pressure chronic ulcer of other part of left lower leg with fat layer exposed Facility Procedures CPT4 Code: 17408144 Description: [place] VISIT-LEV 4 EST PT Modifier: Quantity: 1 Physician Procedures CPT4 Code:  8185631 Description: 49702 - WC PHYS LEVEL 4 - EST PT Modifier: Quantity: 1 CPT4 Code: Description: ICD-10 Diagnosis Description I89.0 Lymphedema, not elsewhere classified I87.2 Venous insufficiency (chronic) (peripheral) L97.822 Non-pressure chronic ulcer of other part of left lower leg with fat lay Modifier: er exposed Quantity: Electronic Signature(s) Signed: 06/10/2020 4:55:31 PM By: Gretta Cool, BSN, RN, CWS, Kim RN, BSN Signed: 06/11/2020 4:17:09 PM By: Worthy Keeler PA-C Previous Signature: 06/10/2020 4:00:46 PM Version By: Worthy Keeler PA-C Entered By: Gretta Cool, BSN, RN, CWS, Kim on 06/10/2020 16:55:30

## 2020-06-11 ENCOUNTER — Other Ambulatory Visit
Admission: RE | Admit: 2020-06-11 | Discharge: 2020-06-11 | Disposition: A | Discharge status: Home / Self Care | Payer: Medicare PPO | Source: Ambulatory Visit | Attending: Physician Assistant | Admitting: Physician Assistant

## 2020-06-11 DIAGNOSIS — L089 Local infection of the skin and subcutaneous tissue, unspecified: Secondary | ICD-10-CM | POA: Insufficient documentation

## 2020-06-11 NOTE — Progress Notes (Signed)
QUINTEL, MCCALLA ([id number]) Visit Report for 06/10/2020 Arrival Information Details Patient Name: Adam Merritt, Adam Merritt Date of Service: 06/10/2020 12:45 PM Medical Record Number: [id number] Patient Account Number: [id number] Date of Birth/Sex: 1945-08-25 (75 y.o. M) Treating RN: Cornell Barman Primary Care Shan Valdes: [name] Other Clinician: Referring Jakiya Bookbinder: [name] Treating Carrson Lightcap/Extender: Skipper Cliche in Treatment: 3 Visit Information History Since Last Visit Added or deleted any medications: No Patient Arrived: Wheel Chair Any new allergies or adverse reactions: No Arrival Time: 13:04 Had a fall or experienced change in No Accompanied By: wife activities of daily living that may affect Transfer Assistance: None risk of falls: Patient Identification Verified: Yes Signs or symptoms of abuse/neglect since last visito No Secondary Verification Process Completed: Yes Hospitalized since last visit: No Implantable device outside of the clinic excluding No cellular tissue based products placed in the center since last visit: Pain Present Now: No Electronic Signature(s) Signed: 06/10/2020 4:51:21 PM By: Lorine Bears RCP, RRT, CHT Entered By: Lorine Bears on 06/10/2020 13:05:54 Adam Merritt ([id number]) -------------------------------------------------------------------------------- Clinic Level of Care Assessment Details Patient Name: Adam Merritt Date of Service: 06/10/2020 12:45 PM Medical Record Number: [id number] Patient Account Number: [id number] Date of Birth/Sex: 10-Oct-1945 (75 y.o. M) Treating RN: Cornell Barman Primary Care Avaeh Ewer: [name] Other Clinician: Referring Olimpia Tinch: [name] Treating Avedis Bevis/Extender: Skipper Cliche in Treatment: 3 Clinic Level of Care Assessment Items TOOL 4 Quantity Score []  - Use when only an EandM is performed on FOLLOW-UP visit 0 ASSESSMENTS - Nursing Assessment / Reassessment X -  Reassessment of Co-morbidities (includes updates in patient status) 1 10 X- 1 5 Reassessment of Adherence to Treatment Plan ASSESSMENTS - Wound and Skin Assessment / Reassessment []  - Simple Wound Assessment / Reassessment - one wound 0 []  - 0 Complex Wound Assessment / Reassessment - multiple wounds []  - 0 Dermatologic / Skin Assessment (not related to wound area) ASSESSMENTS - Focused Assessment X - Circumferential Edema Measurements - multi extremities 2 5 []  - 0 Nutritional Assessment / Counseling / Intervention X- 1 5 Lower Extremity Assessment (monofilament, tuning fork, pulses) []  - 0 Peripheral Arterial Disease Assessment (using hand held doppler) ASSESSMENTS - Ostomy and/or Continence Assessment and Care []  - Incontinence Assessment and Management 0 []  - 0 Ostomy Care Assessment and Management (repouching, etc.) PROCESS - Coordination of Care X - Simple Patient / Family Education for ongoing care 1 15 []  - 0 Complex (extensive) Patient / Family Education for ongoing care X- 1 10 Staff obtains Programmer, systems, Records, Test Results / Process Orders []  - 0 Staff telephones HHA, Nursing Homes / Clarify orders / etc []  - 0 Routine Transfer to another Facility (non-emergent condition) []  - 0 Routine Hospital Admission (non-emergent condition) []  - 0 New Admissions / Biomedical engineer / Ordering NPWT, Apligraf, etc. []  - 0 Emergency Hospital Admission (emergent condition) X- 1 10 Simple Discharge Coordination []  - 0 Complex (extensive) Discharge Coordination PROCESS - Special Needs []  - Pediatric / Minor Patient Management 0 []  - 0 Isolation Patient Management []  - 0 Hearing / Language / Visual special needs []  - 0 Assessment of Community assistance (transportation, D/C planning, etc.) []  - 0 Additional assistance / Altered mentation []  - 0 Support Surface(s) Assessment (bed, cushion, seat, etc.) INTERVENTIONS - Wound Cleansing / Measurement Adam Merritt, Adam Merritt  ([id number]) []  - 0 Simple Wound Cleansing - one wound []  - 0 Complex Wound Cleansing - multiple wounds X- 1 5 Wound Imaging (photographs - any number  of wounds) []  - 0 Wound Tracing (instead of photographs) []  - 0 Simple Wound Measurement - one wound []  - 0 Complex Wound Measurement - multiple wounds INTERVENTIONS - Wound Dressings []  - Small Wound Dressing one or multiple wounds 0 []  - 0 Medium Wound Dressing one or multiple wounds X- 2 20 Large Wound Dressing one or multiple wounds []  - 0 Application of Medications - topical []  - 0 Application of Medications - injection INTERVENTIONS - Miscellaneous []  - External ear exam 0 X- 1 5 Specimen Collection (cultures, biopsies, blood, body fluids, etc.) X- 1 5 Specimen(s) / Culture(s) sent or taken to Lab for analysis []  - 0 Patient Transfer (multiple staff / Civil Service fast streamer / Similar devices) []  - 0 Simple Staple / Suture removal (25 or less) []  - 0 Complex Staple / Suture removal (26 or more) []  - 0 Hypo / Hyperglycemic Management (close monitor of Blood Glucose) []  - 0 Ankle / Brachial Index (ABI) - do not check if billed separately X- 1 5 Vital Signs Has the patient been seen at the hospital within the last three years: Yes Total Score: 125 Level Of Care: New/Established - Level 4 Electronic Signature(s) Signed: 06/11/2020 5:26:41 PM By: Gretta Cool, BSN, RN, CWS, Kim RN, BSN Entered By: Gretta Cool, BSN, RN, CWS, Kim on 06/10/2020 16:54:59 Adam Merritt ([id number]) -------------------------------------------------------------------------------- Encounter Discharge Information Details Patient Name: Adam Merritt Date of Service: 06/10/2020 12:45 PM Medical Record Number: [id number] Patient Account Number: [id number] Date of Birth/Sex: [birth year] (75 y.o. M) Treating RN: Cornell Barman Primary Care Aniela Caniglia: [name] Other Clinician: Referring Viva Gallaher: [name] Treating Rosaire Cueto/Extender: Skipper Cliche in Treatment:  3 Encounter Discharge Information Items Discharge Condition: Stable Ambulatory Status: Wheelchair Discharge Destination: Home Transportation: Private Auto Accompanied By: self Schedule Follow-up Appointment: Yes Clinical Summary of Care: Electronic Signature(s) Signed: 06/10/2020 5:02:36 PM By: Gretta Cool, BSN, RN, CWS, Kim RN, BSN Entered By: Gretta Cool, BSN, RN, CWS, Kim on 06/10/2020 17:02:36 Adam Merritt ([id number]) -------------------------------------------------------------------------------- Lower Extremity Assessment Details Patient Name: Adam Merritt Date of Service: 06/10/2020 12:45 PM Medical Record Number: [id number] Patient Account Number: [id number] Date of Birth/Sex: [birth year] (74 y.o. M) Treating RN: Cornell Barman Primary Care Helga Asbury: [name] Other Clinician: Referring Johnnetta Holstine: [name] Treating Chrisa Hassan/Extender: Jeri Cos [name]: 3 Edema Assessment Assessed: [Left: Yes] [Right: Yes] Edema: [Left: Yes] [Right: Yes] Calf Left: Right: Point of Measurement: 30 cm From Medial Instep 65 cm 54.8 cm Ankle Left: Right: Point of Measurement: 10 cm From Medial Instep 38.5 cm 34.4 cm Vascular Assessment Pulses: Dorsalis Pedis Palpable: [Left:Yes] [Right:Yes] Electronic Signature(s) Signed: 06/11/2020 5:26:41 PM By: Gretta Cool, BSN, RN, CWS, Kim RN, BSN Entered By: Gretta Cool, BSN, RN, CWS, Kim on 06/10/2020 13:17:46 Adam Merritt ([id number]) -------------------------------------------------------------------------------- Multi Wound Chart Details Patient Name: Adam Merritt Date of Service: 06/10/2020 12:45 PM Medical Record Number: [id number] Patient Account Number: [id number] Date of Birth/Sex: 1945/11/22 (74 y.o. M) Treating RN: Cornell Barman Primary Care Nicoli Nardozzi: [name] Other Clinician: Referring Gray Maugeri: [name] Treating Kennisha Qin/Extender: Skipper Cliche in Treatment: 3 Vital Signs Height(in): 66 Pulse(bpm): 68 Weight(lbs): 288 Blood  Pressure(mmHg): 128/70 Body Mass Index(BMI): 46 Temperature(F): 98.0 Respiratory Rate(breaths/min): 20 Wound Assessments Treatment Notes Electronic Signature(s) Signed: 06/11/2020 5:26:41 PM By: Gretta Cool, BSN, RN, CWS, Kim RN, BSN Entered By: Gretta Cool, BSN, RN, CWS, Kim on 06/10/2020 13:18:07 Adam Merritt ([id number]) -------------------------------------------------------------------------------- [place] Details Patient Name: Adam Merritt, Adam Merritt. Date of Service: 06/10/2020 12:45 PM Medical Record Number: [id number] Patient  Account Number: [id number] Date of Birth/Sex: 1946/02/14 (75 y.o. M) Treating RN: Cornell Barman Primary Care Darragh Nay: [name] Other Clinician: Referring Hanin Decook: [name] Treating Laveda Demedeiros/Extender: Skipper Cliche in Treatment: 3 Active Inactive Orientation to the Wound Care Program Nursing Diagnoses: Knowledge deficit related to the wound healing center program Goals: Patient/caregiver will verbalize understanding of the [facility] Date Initiated: 05/20/2020 Target Resolution Date: 05/20/2020 Goal Status: Active Interventions: Provide education on orientation to the wound center Notes: Wound/Skin Impairment Nursing Diagnoses: Impaired tissue integrity Goals: Patient/caregiver will verbalize understanding of skin care regimen Date Initiated: 05/20/2020 Target Resolution Date: 06/20/2020 Goal Status: Active Interventions: Assess ulceration(s) every visit Treatment Activities: Referred to DME Derron Pipkins for dressing supplies : 05/20/2020 Notes: Electronic Signature(s) Signed: 06/11/2020 5:26:41 PM By: Gretta Cool, BSN, RN, CWS, Kim RN, BSN Entered By: Gretta Cool, BSN, RN, CWS, Kim on 06/10/2020 13:17:57 Adam Merritt ([id number]) -------------------------------------------------------------------------------- Non-Wound Condition Assessment Details Patient Name: Adam Merritt Date of Service: 06/10/2020 12:45 PM Medical  Record Number: [id number] Patient Account Number: [id number] Date of Birth/Sex: December 29, 1945 (74 y.o. M) Treating RN: Cornell Barman Primary Care Nitza Schmid: [name] Other Clinician: Referring Ardra Kuznicki: [name] Treating Bettylee Feig/Extender: Jeri Cos [name]: 3 Non-Wound Condition: Condition: Lymphedema Location: Leg Side: Bilateral Notes Patient has bilateral lymphedema with weeping areas. Electronic Signature(s) Signed: 06/11/2020 5:26:41 PM By: Gretta Cool, BSN, RN, CWS, Kim RN, BSN Entered By: Gretta Cool, BSN, RN, CWS, Kim on 06/10/2020 13:18:59 Adam Merritt ([id number]) -------------------------------------------------------------------------------- Pain Assessment Details Patient Name: Adam Merritt Date of Service: 06/10/2020 12:45 PM Medical Record Number: [id number] Patient Account Number: [id number] Date of Birth/Sex: [birth year] (74 y.o. M) Treating RN: Cornell Barman Primary Care Jerold Yoss: [name] Other Clinician: Referring Jalonda Antigua: [name] Treating Jaron Czarnecki/Extender: Skipper Cliche in Treatment: 3 Active Problems Location of Pain Severity and Description of Pain Patient Has Paino No Site Locations Pain Management and Medication Current Pain Management: Notes Patient denies pain at this time. Electronic Signature(s) Signed: 06/11/2020 5:26:41 PM By: Gretta Cool, BSN, RN, CWS, Kim RN, BSN Entered By: Gretta Cool, BSN, RN, CWS, Kim on 06/10/2020 13:13:30 Adam Merritt ([id number]) -------------------------------------------------------------------------------- Patient/Caregiver Education Details Patient Name: Adam Merritt Date of Service: 06/10/2020 12:45 PM Medical Record Number: [id number] Patient Account Number: [id number] Date of Birth/Gender: 10/06/1945 (74 y.o. M) Treating RN: Cornell Barman Primary Care Physician: [name] Other Clinician: Referring Physician: [name] Treating Physician/Extender: Skipper Cliche in Treatment: 3 Education  Assessment Education Provided To: Patient Education Topics Provided Venous: Controlling Swelling with Multilayered Compression Wraps, Managing Venous Disease and Related Ulcers , Other: wear appropriate Handouts: compression stockings daily Methods: Demonstration, Explain/Verbal Responses: State content correctly Electronic Signature(s) Signed: 06/11/2020 5:26:41 PM By: Gretta Cool, BSN, RN, CWS, Kim RN, BSN Entered By: Gretta Cool, BSN, RN, CWS, Kim on 06/10/2020 16:56:46 Adam Merritt ([id number]) -------------------------------------------------------------------------------- [place] Details Patient Name: Adam Merritt Date of Service: 06/10/2020 12:45 PM Medical Record Number: [id number] Patient Account Number: [id number] Date of Birth/Sex: 09-28-1945 (74 y.o. M) Treating RN: Cornell Barman Primary Care Tijuana Scheidegger: [name] Other Clinician: Referring Kayde Warehime: [name] Treating Reet Scharrer/Extender: Skipper Cliche in Treatment: 3 Vital Signs Time Taken: 13:05 Temperature (F): 98.0 Height (in): 66 Pulse (bpm): 68 Weight (lbs): 288 Respiratory Rate (breaths/min): 20 Body Mass Index (BMI): 46.5 Blood Pressure (mmHg): 128/70 Reference Range: 80 - 120 mg / dl Electronic Signature(s) Signed: 06/10/2020 4:51:21 PM By: Lorine Bears RCP, RRT, CHT Entered By: Lorine Bears on 06/10/2020 13:09:31

## 2020-06-13 LAB — AEROBIC CULTURE W GRAM STAIN (SUPERFICIAL SPECIMEN)

## 2020-06-14 ENCOUNTER — Other Ambulatory Visit: Payer: Self-pay

## 2020-06-14 DIAGNOSIS — E11622 Type 2 diabetes mellitus with other skin ulcer: Secondary | ICD-10-CM | POA: Diagnosis not present

## 2020-06-14 DIAGNOSIS — I890 Lymphedema, not elsewhere classified: Secondary | ICD-10-CM | POA: Diagnosis not present

## 2020-06-14 DIAGNOSIS — L97822 Non-pressure chronic ulcer of other part of left lower leg with fat layer exposed: Secondary | ICD-10-CM | POA: Diagnosis not present

## 2020-06-14 DIAGNOSIS — I872 Venous insufficiency (chronic) (peripheral): Secondary | ICD-10-CM | POA: Diagnosis not present

## 2020-06-14 DIAGNOSIS — I509 Heart failure, unspecified: Secondary | ICD-10-CM | POA: Diagnosis not present

## 2020-06-14 DIAGNOSIS — E1140 Type 2 diabetes mellitus with diabetic neuropathy, unspecified: Secondary | ICD-10-CM | POA: Diagnosis not present

## 2020-06-14 DIAGNOSIS — I110 Hypertensive heart disease with heart failure: Secondary | ICD-10-CM | POA: Diagnosis not present

## 2020-06-14 DIAGNOSIS — I2510 Atherosclerotic heart disease of native coronary artery without angina pectoris: Secondary | ICD-10-CM | POA: Diagnosis not present

## 2020-06-14 NOTE — Progress Notes (Signed)
Adam Merritt, Adam Merritt ([id number]) Visit Report for 06/14/2020 Arrival Information Details Patient Name: Adam Merritt, Adam Merritt Date of Service: 06/14/2020 2:45 PM Medical Record Number: [id number] Patient Account Number: [id number] Date of Birth/Sex: [birth year] (75 y.o. M) Treating RN: Dolan Amen Primary Care Buddie Marston: [name] Other Clinician: Referring Lynnsie Linders: [name] Treating Deontay Ladnier/Extender: Skipper Cliche in Treatment: 3 Visit Information History Since Last Visit Pain Present Now: No Patient Arrived: Wheel Chair Arrival Time: 15:05 Accompanied By: spouse Transfer Assistance: None Patient Identification Verified: Yes Secondary Verification Process Completed: Yes Electronic Signature(s) Signed: 06/14/2020 4:50:07 PM By: Georges Mouse, [name] Breeding RN Entered By: Georges Mouse, [name] Breeding on 06/14/2020 15:15:09 Adam Merritt ([id number]) -------------------------------------------------------------------------------- Clinic Level of Care Assessment Details Patient Name: Adam Merritt Date of Service: 06/14/2020 2:45 PM Medical Record Number: [id number] Patient Account Number: [id number] Date of Birth/Sex: 01-Aug-1945 (75 y.o. M) Treating RN: Dolan Amen Primary Care Kallen Delatorre: [name] Other Clinician: Referring Atira Borello: [name] Treating Deon Duer/Extender: Skipper Cliche in Treatment: 3 Clinic Level of Care Assessment Items TOOL 4 Quantity Score X - Use when only an EandM is performed on FOLLOW-UP visit 1 0 ASSESSMENTS - Nursing Assessment / Reassessment X - Reassessment of Co-morbidities (includes updates in patient status) 1 10 X- 1 5 Reassessment of Adherence to Treatment Plan ASSESSMENTS - Wound and Skin Assessment / Reassessment []  - Simple Wound Assessment / Reassessment - one wound 0 X- 1 5 Complex Wound Assessment / Reassessment - multiple wounds []  - 0 Dermatologic / Skin Assessment (not related to wound area) ASSESSMENTS - Focused Assessment []  -  Circumferential Edema Measurements - multi extremities 0 []  - 0 Nutritional Assessment / Counseling / Intervention []  - 0 Lower Extremity Assessment (monofilament, tuning fork, pulses) []  - 0 Peripheral Arterial Disease Assessment (using hand held doppler) ASSESSMENTS - Ostomy and/or Continence Assessment and Care []  - Incontinence Assessment and Management 0 []  - 0 Ostomy Care Assessment and Management (repouching, etc.) PROCESS - Coordination of Care X - Simple Patient / Family Education for ongoing care 1 15 []  - 0 Complex (extensive) Patient / Family Education for ongoing care []  - 0 Staff obtains Programmer, systems, Records, Test Results / Process Orders []  - 0 Staff telephones HHA, Nursing Homes / Clarify orders / etc []  - 0 Routine Transfer to another Facility (non-emergent condition) []  - 0 Routine Hospital Admission (non-emergent condition) []  - 0 New Admissions / Biomedical engineer / Ordering NPWT, Apligraf, etc. []  - 0 Emergency Hospital Admission (emergent condition) X- 1 10 Simple Discharge Coordination []  - 0 Complex (extensive) Discharge Coordination PROCESS - Special Needs []  - Pediatric / Minor Patient Management 0 []  - 0 Isolation Patient Management []  - 0 Hearing / Language / Visual special needs []  - 0 Assessment of Community assistance (transportation, D/C planning, etc.) []  - 0 Additional assistance / Altered mentation []  - 0 Support Surface(s) Assessment (bed, cushion, seat, etc.) INTERVENTIONS - Wound Cleansing / Measurement FOCH, ROSENWALD ([id number]) []  - 0 Simple Wound Cleansing - one wound X- 1 5 Complex Wound Cleansing - multiple wounds []  - 0 Wound Imaging (photographs - any number of wounds) []  - 0 Wound Tracing (instead of photographs) []  - 0 Simple Wound Measurement - one wound []  - 0 Complex Wound Measurement - multiple wounds INTERVENTIONS - Wound Dressings []  - Small Wound Dressing one or multiple wounds 0 X- 1 15 Medium  Wound Dressing one or multiple wounds []  - 0 Large Wound Dressing one or multiple wounds []  - 0 Application  of Medications - topical []  - 0 Application of Medications - injection INTERVENTIONS - Miscellaneous []  - External ear exam 0 []  - 0 Specimen Collection (cultures, biopsies, blood, body fluids, etc.) []  - 0 Specimen(s) / Culture(s) sent or taken to Lab for analysis []  - 0 Patient Transfer (multiple staff / Civil Service fast streamer / Similar devices) []  - 0 Simple Staple / Suture removal (25 or less) []  - 0 Complex Staple / Suture removal (26 or more) []  - 0 Hypo / Hyperglycemic Management (close monitor of Blood Glucose) []  - 0 Ankle / Brachial Index (ABI) - do not check if billed separately []  - 0 Vital Signs Has the patient been seen at the hospital within the last three years: Yes Total Score: 65 Level Of Care: New/Established - Level 2 Electronic Signature(s) Signed: 06/14/2020 4:50:07 PM By: Georges Mouse, [name] Breeding RN Entered By: Georges Mouse, [name] Breeding on 06/14/2020 16:48:16 Adam Merritt ([id number]) -------------------------------------------------------------------------------- Encounter Discharge Information Details Patient Name: Adam Merritt Date of Service: 06/14/2020 2:45 PM Medical Record Number: [id number] Patient Account Number: [id number] Date of Birth/Sex: 31-May-1946 (74 y.o. M) Treating RN: Dolan Amen Primary Care Hassie Mandt: [name] Other Clinician: Referring Kouper Spinella: [name] Treating Starasia Sinko/Extender: Skipper Cliche in Treatment: 3 Encounter Discharge Information Items Discharge Condition: Stable Ambulatory Status: Wheelchair Discharge Destination: Home Transportation: Private Auto Accompanied By: spouse Schedule Follow-up Appointment: Yes Clinical Summary of Care: Electronic Signature(s) Signed: 06/14/2020 4:47:21 PM By: Georges Mouse, [name] Breeding RN Entered By: Georges Mouse, [name] Breeding on 06/14/2020 16:47:20 Adam Merritt  ([id number]) -------------------------------------------------------------------------------- Wound Assessment Details Patient Name: Adam Merritt Date of Service: 06/14/2020 2:45 PM Medical Record Number: [id number] Patient Account Number: [id number] Date of Birth/Sex: 09/23/1945 (74 y.o. M) Treating RN: Dolan Amen Primary Care Barack Nicodemus: [name] Other Clinician: Referring Saniyah Mondesir: [name] Treating Akia Montalban/Extender: Jeri Cos [name]: 3 Wound Status Wound Number: 37 Primary Etiology: Lymphedema Wound Location: Left, Medial Lower Leg Secondary Etiology: Diabetic Wound/Ulcer of the Lower Extremity Wounding Event: Gradually Appeared Wound Status: Open Date Acquired: 05/03/2020 Weeks Of Treatment: 3 Clustered Wound: No Wound Measurements Length: (cm) 0.5 Width: (cm) 0.5 Depth: (cm) 0.1 Area: (cm) 0.196 Volume: (cm) 0.02 % Reduction in Area: 0% % Reduction in Volume: 0% Wound Description Classification: Full Thickness Without Exposed Support Structure s Treatment Notes Wound #37 (Lower Leg) Wound Laterality: Left, Medial Cleanser Peri-Wound Care Topical Primary Dressing Secondary Dressing Secured With Compression Wrap Compression Stockings Add-Ons Electronic Signature(s) Signed: 06/14/2020 4:50:07 PM By: Georges Mouse, [name] Breeding RN Entered By: Georges Mouse, [name] Breeding on 06/14/2020 15:15:27 Adam Merritt ([id number]) -------------------------------------------------------------------------------- Wound Assessment Details Patient Name: Adam Merritt Date of Service: 06/14/2020 2:45 PM Medical Record Number: [id number] Patient Account Number: [id number] Date of Birth/Sex: Oct 30, 1945 (74 y.o. M) Treating RN: Dolan Amen Primary Care Stuart Mirabile: [name] Other Clinician: Referring Robecca Fulgham: [name] Treating Alivya Wegman/Extender: Jeri Cos [name]: 3 Wound Status Wound Number: 38 Primary Etiology: Lymphedema Wound Location:  Left, Posterior Lower Leg Secondary Etiology: Diabetic Wound/Ulcer of the Lower Extremity Wounding Event: Gradually Appeared Wound Status: Open Date Acquired: 05/03/2020 Weeks Of Treatment: 3 Clustered Wound: No Wound Measurements Length: (cm) 2 Width: (cm) 7 Depth: (cm) 0.1 Area: (cm) 10.996 Volume: (cm) 1.1 % Reduction in Area: 0% % Reduction in Volume: 0% Wound Description Classification: Full Thickness Without Exposed Support Structure s Treatment Notes Wound #38 (Lower Leg) Wound Laterality: Left, Posterior Cleanser Peri-Wound Care Topical Primary Dressing Secondary Dressing Secured With Compression Wrap Compression Stockings Add-Ons Electronic Signature(s) Signed: 06/14/2020  4:50:07 PM By: Georges Mouse, [name] Breeding RN Entered By: Georges Mouse, [name] Breeding on 06/14/2020 15:15:27

## 2020-06-15 DIAGNOSIS — S161XXD Strain of muscle, fascia and tendon at neck level, subsequent encounter: Secondary | ICD-10-CM | POA: Diagnosis not present

## 2020-06-15 DIAGNOSIS — J441 Chronic obstructive pulmonary disease with (acute) exacerbation: Secondary | ICD-10-CM | POA: Diagnosis not present

## 2020-06-15 DIAGNOSIS — E1122 Type 2 diabetes mellitus with diabetic chronic kidney disease: Secondary | ICD-10-CM | POA: Diagnosis not present

## 2020-06-15 DIAGNOSIS — I130 Hypertensive heart and chronic kidney disease with heart failure and stage 1 through stage 4 chronic kidney disease, or unspecified chronic kidney disease: Secondary | ICD-10-CM | POA: Diagnosis not present

## 2020-06-15 DIAGNOSIS — I890 Lymphedema, not elsewhere classified: Secondary | ICD-10-CM | POA: Diagnosis not present

## 2020-06-15 DIAGNOSIS — E1142 Type 2 diabetes mellitus with diabetic polyneuropathy: Secondary | ICD-10-CM | POA: Diagnosis not present

## 2020-06-15 DIAGNOSIS — I5032 Chronic diastolic (congestive) heart failure: Secondary | ICD-10-CM | POA: Diagnosis not present

## 2020-06-15 DIAGNOSIS — N183 Chronic kidney disease, stage 3 unspecified: Secondary | ICD-10-CM | POA: Diagnosis not present

## 2020-06-17 DIAGNOSIS — N183 Chronic kidney disease, stage 3 unspecified: Secondary | ICD-10-CM | POA: Diagnosis not present

## 2020-06-17 DIAGNOSIS — I130 Hypertensive heart and chronic kidney disease with heart failure and stage 1 through stage 4 chronic kidney disease, or unspecified chronic kidney disease: Secondary | ICD-10-CM | POA: Diagnosis not present

## 2020-06-17 DIAGNOSIS — E1142 Type 2 diabetes mellitus with diabetic polyneuropathy: Secondary | ICD-10-CM | POA: Diagnosis not present

## 2020-06-17 DIAGNOSIS — I5032 Chronic diastolic (congestive) heart failure: Secondary | ICD-10-CM | POA: Diagnosis not present

## 2020-06-17 DIAGNOSIS — E1122 Type 2 diabetes mellitus with diabetic chronic kidney disease: Secondary | ICD-10-CM | POA: Diagnosis not present

## 2020-06-17 DIAGNOSIS — S161XXD Strain of muscle, fascia and tendon at neck level, subsequent encounter: Secondary | ICD-10-CM | POA: Diagnosis not present

## 2020-06-17 DIAGNOSIS — J441 Chronic obstructive pulmonary disease with (acute) exacerbation: Secondary | ICD-10-CM | POA: Diagnosis not present

## 2020-06-17 DIAGNOSIS — I890 Lymphedema, not elsewhere classified: Secondary | ICD-10-CM | POA: Diagnosis not present

## 2020-06-18 ENCOUNTER — Ambulatory Visit: Payer: Medicare PPO | Admitting: Nurse Practitioner

## 2020-06-21 ENCOUNTER — Ambulatory Visit: Payer: Medicare PPO | Admitting: Physician Assistant

## 2020-06-22 DIAGNOSIS — I130 Hypertensive heart and chronic kidney disease with heart failure and stage 1 through stage 4 chronic kidney disease, or unspecified chronic kidney disease: Secondary | ICD-10-CM | POA: Diagnosis not present

## 2020-06-22 DIAGNOSIS — E1122 Type 2 diabetes mellitus with diabetic chronic kidney disease: Secondary | ICD-10-CM | POA: Diagnosis not present

## 2020-06-22 DIAGNOSIS — I5032 Chronic diastolic (congestive) heart failure: Secondary | ICD-10-CM | POA: Diagnosis not present

## 2020-06-22 DIAGNOSIS — N183 Chronic kidney disease, stage 3 unspecified: Secondary | ICD-10-CM | POA: Diagnosis not present

## 2020-06-22 DIAGNOSIS — E1142 Type 2 diabetes mellitus with diabetic polyneuropathy: Secondary | ICD-10-CM | POA: Diagnosis not present

## 2020-06-22 DIAGNOSIS — J441 Chronic obstructive pulmonary disease with (acute) exacerbation: Secondary | ICD-10-CM | POA: Diagnosis not present

## 2020-06-22 DIAGNOSIS — I890 Lymphedema, not elsewhere classified: Secondary | ICD-10-CM | POA: Diagnosis not present

## 2020-06-22 DIAGNOSIS — S161XXD Strain of muscle, fascia and tendon at neck level, subsequent encounter: Secondary | ICD-10-CM | POA: Diagnosis not present

## 2020-06-23 ENCOUNTER — Ambulatory Visit: Payer: Medicare PPO | Admitting: Internal Medicine

## 2020-06-24 ENCOUNTER — Ambulatory Visit: Payer: TRICARE For Life (TFL) | Admitting: Physician Assistant

## 2020-06-24 DIAGNOSIS — E1142 Type 2 diabetes mellitus with diabetic polyneuropathy: Secondary | ICD-10-CM | POA: Diagnosis not present

## 2020-06-24 DIAGNOSIS — S161XXD Strain of muscle, fascia and tendon at neck level, subsequent encounter: Secondary | ICD-10-CM | POA: Diagnosis not present

## 2020-06-24 DIAGNOSIS — I890 Lymphedema, not elsewhere classified: Secondary | ICD-10-CM | POA: Diagnosis not present

## 2020-06-24 DIAGNOSIS — I130 Hypertensive heart and chronic kidney disease with heart failure and stage 1 through stage 4 chronic kidney disease, or unspecified chronic kidney disease: Secondary | ICD-10-CM | POA: Diagnosis not present

## 2020-06-24 DIAGNOSIS — N183 Chronic kidney disease, stage 3 unspecified: Secondary | ICD-10-CM | POA: Diagnosis not present

## 2020-06-24 DIAGNOSIS — I5032 Chronic diastolic (congestive) heart failure: Secondary | ICD-10-CM | POA: Diagnosis not present

## 2020-06-24 DIAGNOSIS — J441 Chronic obstructive pulmonary disease with (acute) exacerbation: Secondary | ICD-10-CM | POA: Diagnosis not present

## 2020-06-24 DIAGNOSIS — E1122 Type 2 diabetes mellitus with diabetic chronic kidney disease: Secondary | ICD-10-CM | POA: Diagnosis not present

## 2020-06-25 DIAGNOSIS — S161XXD Strain of muscle, fascia and tendon at neck level, subsequent encounter: Secondary | ICD-10-CM | POA: Diagnosis not present

## 2020-06-25 DIAGNOSIS — E1142 Type 2 diabetes mellitus with diabetic polyneuropathy: Secondary | ICD-10-CM | POA: Diagnosis not present

## 2020-06-25 DIAGNOSIS — I890 Lymphedema, not elsewhere classified: Secondary | ICD-10-CM | POA: Diagnosis not present

## 2020-06-25 DIAGNOSIS — J441 Chronic obstructive pulmonary disease with (acute) exacerbation: Secondary | ICD-10-CM | POA: Diagnosis not present

## 2020-06-25 DIAGNOSIS — I130 Hypertensive heart and chronic kidney disease with heart failure and stage 1 through stage 4 chronic kidney disease, or unspecified chronic kidney disease: Secondary | ICD-10-CM | POA: Diagnosis not present

## 2020-06-25 DIAGNOSIS — I5032 Chronic diastolic (congestive) heart failure: Secondary | ICD-10-CM | POA: Diagnosis not present

## 2020-06-25 DIAGNOSIS — E1122 Type 2 diabetes mellitus with diabetic chronic kidney disease: Secondary | ICD-10-CM | POA: Diagnosis not present

## 2020-06-25 DIAGNOSIS — N183 Chronic kidney disease, stage 3 unspecified: Secondary | ICD-10-CM | POA: Diagnosis not present

## 2020-06-28 ENCOUNTER — Other Ambulatory Visit: Payer: Self-pay

## 2020-06-28 ENCOUNTER — Encounter: Payer: Medicare PPO | Admitting: Physician Assistant

## 2020-06-28 DIAGNOSIS — L97822 Non-pressure chronic ulcer of other part of left lower leg with fat layer exposed: Secondary | ICD-10-CM | POA: Diagnosis not present

## 2020-06-28 DIAGNOSIS — I509 Heart failure, unspecified: Secondary | ICD-10-CM | POA: Diagnosis not present

## 2020-06-28 DIAGNOSIS — E1140 Type 2 diabetes mellitus with diabetic neuropathy, unspecified: Secondary | ICD-10-CM | POA: Diagnosis not present

## 2020-06-28 DIAGNOSIS — E11622 Type 2 diabetes mellitus with other skin ulcer: Secondary | ICD-10-CM | POA: Diagnosis not present

## 2020-06-28 DIAGNOSIS — I2510 Atherosclerotic heart disease of native coronary artery without angina pectoris: Secondary | ICD-10-CM | POA: Diagnosis not present

## 2020-06-28 DIAGNOSIS — I872 Venous insufficiency (chronic) (peripheral): Secondary | ICD-10-CM | POA: Diagnosis not present

## 2020-06-28 DIAGNOSIS — L97812 Non-pressure chronic ulcer of other part of right lower leg with fat layer exposed: Secondary | ICD-10-CM | POA: Diagnosis not present

## 2020-06-28 DIAGNOSIS — I890 Lymphedema, not elsewhere classified: Secondary | ICD-10-CM | POA: Diagnosis not present

## 2020-06-28 DIAGNOSIS — I110 Hypertensive heart disease with heart failure: Secondary | ICD-10-CM | POA: Diagnosis not present

## 2020-06-28 NOTE — Progress Notes (Addendum)
JABRON, WEESE ([id number]) Visit Report for 06/28/2020 Chief Complaint Document Details Patient Name: Adam Merritt, Adam Merritt Date of Service: 06/28/2020 2:30 PM Medical Record Number: [id number] Patient Account Number: [id number] Date of Birth/Sex: 1945/11/14 (75 y.o. M) Treating RN: Cornell Barman Primary Care Provider: [name] Other Clinician: Referring Provider: [name] Treating Provider/Extender: Skipper Cliche in Treatment: 5 Information Obtained from: Patient Chief Complaint Left leg ulcers Electronic Signature(s) Signed: 06/28/2020 3:18:24 PM By: Worthy Keeler PA-C Entered By: Worthy Keeler on 06/28/2020 15:18:23 Adam Merritt ([id number]) -------------------------------------------------------------------------------- HPI Details Patient Name: Adam Merritt Date of Service: 06/28/2020 2:30 PM Medical Record Number: [id number] Patient Account Number: [id number] Date of Birth/Sex: [birth year] (75 y.o. M) Treating RN: Cornell Barman Primary Care Provider: [name] Other Clinician: Referring Provider: [name] Treating Provider/Extender: Skipper Cliche in Treatment: 5 History of Present Illness HPI Description: 10/18/17-He is here for initial evaluation of bilateral lower extremity ulcerations in the presence of venous insufficiency and lymphedema. He has been seen by vascular medicine in the past, Dr. Lucky Cowboy, last seen in 2016. He does have a history of abnormal ABIs, which is to be expected given his lymphedema and venous insufficiency. According to Epic, it appears that all attempts for arterial evaluation and/or angiography were not follow through with by patient. He does have a history of being seen in lymphedema clinic in 2018, stopped going approximately 6 months ago stating "it didn't do any good". He does not have lymphedema pumps, he does not have custom fit compression wrap/stockings. He is diabetic and his recent A1c last month was 7.6. He admits to chronic  bilateral lower extremity pain, no change in pain since blister and ulceration development. He is currently being treated with Levaquin for bronchitis. He has home health and we will continue. 10/25/17-He is here in follow-up evaluation for bilateral lower extremity ulcerationssubtle he remains on Levaquin for bronchitis. Right lower extremity with no evidence of drainage or ulceration, persistent left lower extremity ulceration. He states that home health has not been out since his appointment. He went to [facility] on Tuesday, studies revealed: RIGHT ABI 0.9, TBI 0.6 LEFT ABI 1.1, TBI 0.6 with triphasic flow bilaterally. We will continue his same treatment plan. He has been educated on compression therapy and need for elevation. He will benefit from lymphedema pumps 11/01/17-He is here in follow-up evaluation for left lower extremity ulcer. The right lower extremity remains healed. He has home health services, but they have not been out to see the patient for 2-3 weeks. He states it home health physical therapy changed his dressing yesterday after therapy; he placed Ace wrap compression. We are still waiting for lymphedema pumps, reordered d/t need for company change. 11/08/17-He is here in follow-up evaluation for left lower extremity ulcer. It is improved. Edema is significantly improved with compression therapy. We will continue with same treatment plan and he will follow-up next week. No word regarding lymphedema pumps 11/15/17-He is here in follow-up evaluation for left lower extremity ulcer. He is healed and will be discharged from wound care services. I have reached out to [facility] regarding his lymphedema pumps. They have been unable to reach the patient; the contact number they had with the patient's wife's cell phone and she has not answered any unrecognized calls. Contact should be made today, trial planned for next week; [facility] will continue to  follow 11/27/17 on evaluation today patient has multiple blistered areas over the right lower extremity his  left lower extremity appears to be doing okay. These blistered areas show signs of no infection which is great news. With that being said he did have some necrotic skin overlying which was mechanically debrided away with saline and gauze today without complication. Overall post debridement the wounds appear to be doing better but in general his swelling seems to be increased. This is obviously not good news. I think this is what has given rise to the blisters. 12/04/17 on evaluation today patient presents for follow-up concerning his bilateral lower extremity edema in the right lower extremity ulcers. He has been tolerating the dressing changes without complication. With that being said he has had no real issues with the wraps which is also good news. Overall I'm pleased with the progress he's been making. 12/11/17 on evaluation today patient appears to be doing rather well in regard to his right lateral lower extremity ulcer. He's been tolerating the dressing changes without complication. Fortunately there does not appear to be any evidence of infection at this time. Overall I'm pleased with the progress that is being made. Unfortunately he has been in the hospital due to having what sounds to be a stomach virus/flu fortunately that is starting to get better. 12/18/17 on evaluation today patient actually appears to be doing very well in regard to his bilateral lower extremities the swelling is under fairly good control his lymphedema pumps are still not up and running quite yet. With that being said he does have several areas of opening noted as far as wounds are concerned mainly over the left lower extremity. With that being said I do believe once he gets lymphedema pumps this would least help you mention some the fluid and preventing this from occurring. Hopefully that will be set up soon sleeves  are Artie in place at his home he just waiting for the machine. 12/25/17 on evaluation today patient actually appears to be doing excellent in fact all of his ulcers appear to have resolved his legs appear very well. I do think he needs compression stockings we have discussed this and they are actually going to go to [place] today to elastic therapy to get this fitted for him. I think that is definitely a good thing to do. Readmission: 04/09/18 upon evaluation today this patient is seen for readmission due to bilateral lower extremity lymphedema. He has significant swelling of his extremities especially on the left although the right is also swollen he has weeping from both sides. There are no obvious open wounds at this point. Fortunately he has been doing fairly well for quite a bit of time since I last saw him. Nonetheless unfortunately this seems to have reopened and is giving quite a bit of trouble. He states this began about a week ago when he first called [place] to get in to be seen. No fevers, chills, nausea, or vomiting noted at this time. He has not been using his lymphedema pumps due to the fact that they won't fit on his leg at this point likewise is also not been using his compression for essentially the same reason. 04/16/18 upon evaluation today patient actually appears to be doing a little better in regard to the fluid in his bilateral lower extremities. With that being said he's had three falls since I saw him last week. He also states that he's been feeling very poorly. I was concerned last week and feel like that the concern is still there as far as the congestion in his chest  is concerned he seems to be breathing about the same as last week but again he states he's very weak he's not even able to walk further than from the chair to the door. His wife had to buy a wheelchair just to be able to get them out of the house to get to the appointment today. This has me very  concerned. 04/23/18 on evaluation today patient actually appears to be doing much better than last week's evaluation. At that time actually had to transport him to the ER via EMS and he subsequently was admitted for acute pulmonary edema, acute renal failure, and acute congestive heart failure. Fortunately he is doing much better. Apparently they did dialyze him and were able to take off roughly 35 pounds of fluid. Nonetheless he is feeling much better both in regard to his breathing and he's able to get around much better at this time compared to previous. Overall I'm very Adam Merritt, Adam Merritt ([id number]) happy with how things are at this time. There does not appear to be any evidence of infection currently. No fevers, chills, nausea, or vomiting noted at this time. 04/30/2018 patient seen today for follow-up and management of bilateral lower extremity lymphedema. He did express being more sad today than usual due to the recent loss of his dog. He states that he has been compliant with using the lymphedema pumps. However he does admit a minute over the last 2-3 days he has not been using the pumps due to the recent loss of his dog. At this time there is no drainage or open wounds to his lower extremities. The left leg edema is measuring smaller today. Still has a significant amount of edema on bilateral lower extremities With dry flaky skin. He will be referred to the lymphedema clinic for further management. Will continue 3 layer compression wraps and follow-up in 1-2 weeks.Denies any pain, fever, chairs recently. No recent falls or injuries reported during this visit. 05/07/18 on evaluation today patient actually appears to be doing very well in regard to his lower extremities in general all things considered. With that being said he is having some pain in the legs just due to the amount of swelling. He does have an area where he had a blister on the left lateral lower extremity this is open at this  point other than that there's nothing else weeping at this time. 05/14/18 on evaluation today patient actually appears to be doing excellent all things considered in regard to his lower extremities. He still has a couple areas of weeping on each leg which has continued to be the issue for him. He does have an appointment with the lymphedema clinic although this isn't until February 2020. That was the earliest they had. In the meantime he has continued to tolerate the compression wraps without complication. 05/28/18 on evaluation today patient actually appears to be doing more poorly in regard to his left lower extremity where he has a wound open at this point. He also had a fall where he subsequently injured his right great toe which has led to an open wound at the site unfortunately. He has been tolerating the dressing changes without complication in general as far as the wraps are concerned that he has not been putting any dressing on the left 1st toe ulcer site. 06/11/18 on evaluation today patient appears to be doing much worse in regard to his bilateral lower extremity ulcers. He has been tolerating the dressing changes without complication although his legs  have not been wrapped more recently. Overall I am not very pleased with the way his legs appear. I do believe he needs to be back in compression wraps he still has not received his compression wraps from the [facility] hospital as of yet. 06/18/18 on evaluation today patient actually appears to be doing significantly better than last time I saw him. He has been tolerating the compression wraps without complication in the circumferential ulcers especially appear to be doing much better. His toe ulcer on the right in regard to the great toe is better although not as good as the legs in my pinion. No fevers chills noted 07/02/18 on evaluation today patient appears to be doing much better in regard to his lower extremity ulcers. Unfortunately since I last  saw him he's had the distal portion of his right great toe if he dated it sounds as if this actually went downhill very quickly. I had only seen him a few days prior and the toe did not appear to be infected at that point subsequently became infected very rapidly and it was decided by the surgeon that the distal portion of the toe needed to be removed. The patient seems to be doing well in this regard he tells me. With that being said his lower extremities are doing better from the standpoint of the wounds although he is significantly swollen at this point. 07/09/18 on evaluation today patient appears to be doing better in regard to the wounds on his lower extremities. In fact everything is almost completely healed he is just a small area on the left posterior lower extremity that is open at this point. 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 With that being said he needs some kind of compression it does sound like he may not be wearing his compression, that is the wraps, during the entire time between when he's here visit to visit. Apparently his wife took the current one off because it began to "fall apart". 07/16/18 on evaluation today patient appears to be extremely swollen especially in regard to his left lower extremity unfortunately. He also has a new skin tear over the left lower extremity and there's a smaller area on the right lower extremity as well. Unfortunately this seems to be due in part to blistering and fluid buildup in his leg. He did get the reduction wraps that were ordered by the [facility] hospital for him to go to lymphedema clinic. With that being said his wounds on the legs have not healed to the point to where they would likely accept them as a patient lymphedema clinic currently. We need to try to  get this to heal. With that being said he's been taking his wraps off which is not doing him any favors at this point. In fact this is probably quite counterproductive compared to what needs to occur. We will likely need to increase to a four layer compression wrap and continue to also utilize elevation and he has to keep the wraps on not take them off as he's been doing currently hasn't had a wrap on since Saturday. 07/23/18 on evaluation today patient appears to be doing much better in regard to his bilateral lower extremities. In fact his left lower extremity which was the largest is actually 15 cm smaller today compared to what it  was last time he was here in our clinic. This is obviously good news after just one week. Nonetheless the differences he actually kept the wraps on during the entire week this time. That's not typical for him. I do believe he understands a little bit better now the severity of the situation and why it's important for him to keep these wraps on. 07/30/18 on evaluation today patient actually appears to be doing rather well in regard to his lower extremities. His legs are much smaller than they have been in the past and he actually has only one very small rudder superficial region remaining that is not closed on the left lateral/posterior lower extremity even this is almost completely close. I do believe likely next week he will be healed without any complications. I do think we need to continue the wraps however this seems to be beneficial for him. I also think it may be a good time for [place] to go ahead and see about getting the appointment with the lymphedema clinic which is supposed to be made for him in order to keep this moving along and hopefully get them into compression wraps that will in the end help him to remain healed. 08/06/18 on evaluation today patient actually appears to be doing very well in regard as bilateral lower extremities. In fact his wounds appear to  be completely healed at this time. 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 He is in the process of getting appointment with the lymphedema clinic we have made this referral were just waiting to hear back on the schedule time. We need to follow up on that today as well. 08/13/18 on evaluation today patient actually appears to be doing very well in regard to his bilateral lower extremities there are no open wounds at this point. We are gonna go ahead and see about ordering the Velcro compression wraps for him had a discussion with them about [place] doing it versus the [place] they feel like they can definitely afford going ahead and get the wraps themselves and they would prefer to try to avoid having to go to the lymphedema clinic if it all possible which I completely understand. As long as he has good compression I'm okay either way. 3/17//20 on evaluation today patient actually appears to be doing well in regard to his bilateral lower extremity ulcers. He has been tolerating the dressing changes without complication specifically the compression wraps. Overall is had no issues my fingers finding that I see at this point is that he is having trouble with constipation. He tells me he has not been able to go to the bathroom for about six days. He's taken to over-the- counter oral laxatives unfortunately this is not helping. He has not contacted his doctor. Adam Merritt, Adam Merritt ([id number]) 08/27/18 on evaluation today patient appears to be doing fairly well in regard to his lower extremities at this point. There does not appear to be any new altars is swelling is very well controlled. We are still waiting for his Velcro compression wraps to arrive that should be sometime in the next week is Artie sent the check for these. 09/03/18 on evaluation today patient appears to be doing excellent in regard to his bilateral lower extremities which he shows no signs of  wound openings in regard to this point. He does have his Velcro compression wraps which did arrive in the mail since I saw him last week. Overall he is  doing excellent in my pinion. 09/13/18 on evaluation today patient appears to be doing very well currently in regard to the overall appearance of his bilateral lower extremities although he's a little bit more swollen than last time we saw him. At that point he been discharged without any open wounds. Nonetheless he has a small open wound on the posterior left lower extremity with some evidence of cellulitis noted as well. Fortunately I feel like he has made good progress overall with regard to his lower extremities from were things used to be. 09/20/18 on evaluation today patient actually appears to be doing much better. The erythematous lower extremity is improving wound itself which is still open appears to be doing much better as far as but appearance as well as pain is concerned overall very pleased in this regard. There's no signs of active infection at this time. 09/27/18 on evaluation today patient's wounds on the lower extremity actually appear to be doing fairly well at this time which is good news. There is no evidence of active infection currently and again is just as left lower extremity were there any wounds at this point anyway. I believe they may be completely healed but again I'm not 100% sure based on evaluation today. I think one more week of observation would likely be a good idea. 10/04/18 on evaluation today patient actually appears to be doing excellent in regard to the left lower extremity which actually appears to be completely healed as of today. Unfortunately he's been having issues with his right lower extremity have a new wound that has opened. Fortunately there's no evidence of active infection at this time which is good news. 10/10/18 upon evaluation today patient actually appears to be doing a little bit worse with the new  open area on his right posterior lower extremity. He's been tolerating the dressing changes without complication. Right now we been using his compression wraps although I think we may need to switch back to actually performing bilateral compression wraps are in the clinic. No fevers, chills, nausea, or vomiting noted at this time. 10/17/18 on evaluation today patient actually appears to be doing quite well in regard to his bilateral lower extremity ulcers. He has been tolerating the reps without complication although he would prefer not to rewrap his legs as of today. Fortunately there's no signs of active infection which is good news. No fevers, chills, nausea, or vomiting noted at this time. 10/24/18 on evaluation today patient appears to be doing very well in regard to his lower extremities. His right lower extremity is shown signs of healing and his left lower Trinity though not healed appears to be improving which is excellent news. Overall very pleased with how things seem to be progressing at this time. The patient is likewise happy to hear this. His Velcro compression wraps however have not been put on properly will gonna show his wife how to do that properly today. 10/31/18 on evaluation today patient appears to be doing more poorly in regard to his left lower extremity in particular although both lower extremities actually are showing some signs of being worse than my previous evaluation. Unfortunately I'm just not sure that his compression stockings even with the use of the compression/lymphedema pumps seem to be controlling this well. Upon further questioning he tells me that he also is not able to lie flat in the bed due to his congestive heart failure and difficulty breathing. For that reason he sleeps in his recliner. To make  matters worse his recliner also cannot even hold his legs up so instead of even being somewhat elevated they pretty much hang down to the floor. This is the way he  sleeps each night which is definitely counterproductive to everything else that were attempting to do from the standpoint of controlling his fluid. Nonetheless I think that he potentially could benefit from a hospital bed although this would be something that his primary care provider would likely have to order since anything that is order on our side has to be directly related to wound care and again the hospital bed is not necessarily a direct relation to although I think it does contribute to his overall wound status on his lower extremities. 11/07/18 on evaluation today patient appears to be doing better in regard to his bilateral lower extremity. He's been tolerating the dressing changes without complication. We did in the interim since I last saw him switch to just using extras orbiting new alginate and that seems to have done much better for him. I'm very pleased with the overall progress that is made. 11/14/18 on evaluation today patient appears to be redoing rather well in regard to his left lower extremity ulcers which are the only ones remaining at this point. Fortunately there's no signs of active infection at this time which is good news. Overall been very pleased with how things seem to be progressing currently. No fevers, chills, nausea, or vomiting noted at this time. 11/20/17 on evaluation today patient actually appears to be doing quite well in regard to his lower extremities on the left on the right he has several blisters that showed up although there's some question about whether or not he has had his broker compression wraps on like he was supposed to or not. Fortunately there's no signs of active infection at this time which is good news. Unfortunately though he is doing well from the standpoint of the left leg the right leg is not doing as well again this is when we did not wrap last week. 11/28/18 on evaluation today patient appears to be doing well in regard to his bilateral lower  extremities is left appears to be healed is right is not healed but is very close to being so. Overall very pleased with how things seem to be progressing. Patient is likewise happy that things are doing well. 12/09/18 on evaluation today patient actually appears to be completely healed which is excellent news. He actually seems to be doing well in regard to the swelling of the bilateral lower extremities which is also great news. Overall very pleased with how things seem to be progressing. Readmission: 03/18/2019 upon evaluation today patient presents for reevaluation concerning issues that he is having with his left lower extremity where he does have a wound noted upon inspection today. He also has a wound on the right second toe on the tip where it is apparently been rubbing in his shoe I did look at issues and you can actually see where this has been occurring as well. Fortunately there is no signs of infection with regard to the toe necessarily although it is very swollen compared to normal that does have me somewhat concerned about the possibility of further evaluation for infection/osteomyelitis. I would recommend an x-ray to start with. Otherwise he states that it is been 1-2 weeks that he has had the draining in regard to left lower extremity he does not know how this happened he has been wearing his compression appropriately and  I do see that as well today but nonetheless I do think that he needs to continue to be very cautious with regard to elevation as well. Adam Merritt, Adam Merritt ([id number]) 03/25/2019 on evaluation today patient appears to be doing much better in regard to his lower extremity at this time. That is on the left. He did culture positive for Pseudomonas but the good news is he seems to be doing much better the gentamicin we applied topically seems to have done a great job for him. There is no signs of active infection at this time systemically and locally he is doing much better.  No fevers, chills, nausea, vomiting, or diarrhea. In regard to his right toe this seems to be doing much better there is very little pressure noted at this point I did clean up some of the callus today around the edges of the wound as well as the surface of the wound but to be honest he is progressing quite nicely. 10/30; the area on the left anterior lower tibia area is healed over. His edema control is adequate even though his use of the compression pumps seems very intermittent. He has a juxta lite stocking on the right leg and a think there is 1 for the left leg at home. His wife is putting these on. He has an area on the plantar right second toe which is a hammertoe they are trying to offload this 04/08/2019 on evaluation today patient appears to be doing well in regard to his lower extremities which is good news. We are seeing him today for his toe ulcer which is giving him a little bit more trouble but again still seems to be doing better at this point which is good news. He is going require some sharp debridement in regard to the toe today however. 04/15/2019 on evaluation today patient actually appears to be doing quite well with regard to his toe ulcer. I am very pleased with how things are doing in that regard. With regard to his lower extremity edema in general he tells me he is not been using the lymphedema pumps regularly although he does not use them. I think that he needs to use them more regularly he does have a small blister on the left lower extremity this is not open at this point but obviously it something that can get worse if he does not keep the compression going and the pumps going as well. 04/22/2019 on evaluation today patient appears to be doing well with regard to his toe ulcer. He has been tolerating the dressing changes without complication. This is measuring slightly smaller this week as compared to last week. Fortunately there is no signs of active infection at this  time. 05/06/2019 on evaluation today patient actually appears to be doing excellent. In fact his toe appears to be completely healed which is great news. There is no signs of active infection at this time. Readmission: Patient presents today for follow-up after having been discharged at the beginning of December 2020. He states that in recent weeks things have reopened and has been having more trouble at this point. With that being said fortunately there is no signs of active infection at this time. No fever chills noted. Nonetheless he also does have an issue with one of his toes as well that seems to be somewhat this is on the right foot second toe. 07/25/2019 upon evaluation today patient's lower extremities appear to be doing excellent bilaterally. I really feel like he  is showing signs of great improvement and in fact I think he is close to healing which is great news. There is no signs of active infection at this time. No fevers, chills, nausea, vomiting, or diarrhea. 07/31/2019 upon evaluation today patient appears to be doing excellent and in fact I think may be completely healed in regard to his right lower extremity that were still to monitor this 1 more week before closing it out. The left lower extremity is measuring smaller although not completely closed seems to be doing excellent. 08/07/2019 upon evaluation today patient appears to be doing excellent in regard to his wounds. In fact the right lower extremity is completely healed there is no issues here. The left lower extremity has 1 very small area still remaining fortunately there is no signs of infection and overall I think he is very close to closure of the here as well. 08/14/19 upon evaluation today patient appears to be doing excellent in regard to his lower extremities. In fact he appears to be completely healed as of today. Fortunately there's no signs of active infection at this time. No fevers, chills, nausea, or vomiting noted  at this time. READMISSION 09/26/2019 This is a 75 year old man who is well-known to this clinic having just been discharged on 08/14/2019. He has known lymphedema and chronic venous insufficiency. He has Farrow wrap stockings and also external compression pumps. He does not use the external compression pumps however. He does however apparently fairly faithfully use the stockings. They have not been lotion in his legs. He has developed new wounds on the right anterior as well as the left medial left lateral and left posterior calf. He returns for our review of this Past medical history includes coronary artery disease, hypertension, congestive heart failure, COPD, venous insufficiency with lymphedema, type 2 diabetes. He apparently has compression pumps, Farrow wraps and at 1 point a hospital bed although I believe he sleeps in a recliner 10/03/2019 upon evaluation today patient appears to be doing better with regard to his wounds in general. He has been tolerating the dressing changes without complication. Fortunately there is no signs of active infection. No fevers, chills, nausea, vomiting, or diarrhea. I do believe the compression wraps are helping as they always have in the past. 10/10/2019 upon evaluation today patient appears to be doing excellent at this point. His right lower extremity is measuring much better and in fact is completely healed. His left lower extremity is also measuring better though not healed seems to be doing excellent at this point which is great news. Overall I am very pleased with how things appear currently. 10/27/2019 upon evaluation today patient appears to be doing quite well with regard to his wounds currently. He has been tolerating the dressing changes without complication. Fortunately there is no signs of active infection at this time. In fact he is almost completely healed on the left and has just a very small area open and on the right he is completely  healed. 11/04/2019 upon evaluation today patient appears to be doing quite well with regard to his wounds. In fact he appears to be quite possibly completely healed on the left lower extremity now as well the right lower extremity is still doing well. With that being said unfortunately though this may be healed I think it is a very fragile area and I cannot even confirm there is not a very small opening still remaining. I really think he may benefit from 1 additional weeks wrapping before  discontinuing. 11/10/2019 upon evaluation today patient appears to be doing well in regard to his original wounds in fact everything that we were treating last week is completely healed on the left. Unfortunately he has a new skin tear just above where the wrap slipped down due to a fall he sustained causing this injury. 11/17/2019 on evaluation today patient appears to be doing better with regard to his wound. He has been tolerating the dressing changes without Adam Merritt, Adam Merritt. ([id number]) complication. Fortunately there is no signs of active infection at this time. No fevers, chills, nausea, vomiting, or diarrhea. 11/24/2019 upon evaluation today patient appears to be doing well with regard to his wound. This is making good progress there does not appear to be signs of active infection but overall I do feel like he is headed in the correct direction. Overall he is tolerating the compression wrap as well. 12/02/2019 upon evaluation today patient appears to be doing well with regard to his leg ulcer. In fact this appears to be completely healed and this is the last of the wounds that he had but still the left anterior lower extremity. Fortunately there is no signs of active infection at this time. No fevers, chills, nausea, vomiting, or diarrhea. Readmission: 02/05/2020 on evaluation today patient appears to be doing well with regard to his wound all things considered. He actually tells me that a couple weeks ago he had  trouble with his wraps and therefore took him off for a couple of days in order to give his legs a break. It was during this time that he actually developed increased swelling and edema of his legs and blisters on both legs. That is when he called to make the appointment. Nonetheless since that time he began wearing his compression wraps which are Velcro in the meantime and has done extremely well with this. In fact his leg appears to be under good control as far as edema is concerned today it is nothing like what I am seeing when he has come in for evaluations in the past. They have been using some of the silver alginate at home which has been beneficial for him. Fortunately there is no signs of active infection at this time. No fevers, chills, nausea, vomiting, or diarrhea. 02/19/2020 on evaluation today patient unfortunately does not appear to be doing quite as well as I would like to see. He has no signs of active infection at this time but unfortunately he is not doing well in regard to the overall appearance of his left leg. He has a couple other areas that are weeping now and the leg is much more swollen. I think that he needs to actually have a compression wrap to try to manage this. 02/26/2020 on evaluation today patient appears to be doing well with regard to his left lower extremity ulcer. Fortunately the swelling is down I do believe the pressure wrap seems to be doing quite well. Fortunately there is no signs of active infection at this time. 03/05/2020 upon evaluation today patient appears to be doing excellent in regard to his legs at this point. Fortunately there is no signs of active infection which is great news. Overall he feels like he is completely healed which is great news. Readmission: 05/20/2020 upon evaluation today patient appears to be doing well with regard to his leg ulcer all things considered. He is actually coming back to see [place] after having had a reopening of the ulcers  on his left leg in  the past several weeks. He is out of dressing supplies and his wife is been try to take care of this as best she can. 06/10/2020 upon evaluation today patient unfortunately appears to be doing significantly worse today compared to when I last saw him. He and his wife both tell me that "there has been a lot going on". I did not actually go into detail about everything that has been happening but nonetheless he has not obviously been wearing his compression. He brings them with him today but they were not on. I am not even certain to be honest that he could get those on at this point. Nonetheless I do believe that he is going to require compression wrapping at some point shortly but right now we need to try to get what appears to be infection under control at this time. 06/28/2020 upon evaluation today patient appears to be doing well with regard to 06/28/2020 upon evaluation today patient appears to be doing well pretty well in regard to his left leg which is much better than previous. With that being said in regard to his right leg he does seem to be having some issues with a new wound after he sustained a fall. This fortunately is not too deep but does appear to be something we need to address. Neither deep which is good. Electronic Signature(s) Signed: 06/28/2020 3:45:37 PM By: Worthy Keeler PA-C Entered By: Worthy Keeler on 06/28/2020 15:45:37 Adam Merritt ([id number]) -------------------------------------------------------------------------------- Physical Exam Details Patient Name: Adam Merritt Date of Service: 06/28/2020 2:30 PM Medical Record Number: [id number] Patient Account Number: [id number] Date of Birth/Sex: 1945/11/17 (75 y.o. M) Treating RN: Cornell Barman Primary Care Provider: [name] Other Clinician: Referring Provider: [name] Treating Provider/Extender: Jeri Cos [name]: 5 Constitutional Obese and well-hydrated in no acute  distress. Respiratory normal breathing without difficulty. Psychiatric this patient is able to make decisions and demonstrates good insight into disease process. Alert and Oriented x 3. pleasant and cooperative. Notes On inspection patient's wound bed actually showed signs of good granulation at this time. There does not appear to be any signs of active infection which is great news and overall very pleased with where things stand. No fevers, chills, nausea, vomiting, or diarrhea. Electronic Signature(s) Signed: 06/28/2020 3:45:59 PM By: Worthy Keeler PA-C Entered By: Worthy Keeler on 06/28/2020 15:45:58 Adam Merritt ([id number]) -------------------------------------------------------------------------------- Physician Orders Details Patient Name: Adam Merritt Date of Service: 06/28/2020 2:30 PM Medical Record Number: [id number] Patient Account Number: [id number] Date of Birth/Sex: [birth year] (75 y.o. M) Treating RN: Carlene Coria Primary Care Provider: [name] Other Clinician: Referring Provider: [name] Treating Provider/Extender: Skipper Cliche in Treatment: 5 Verbal / Phone Orders: No Diagnosis Coding ICD-10 Coding Code Description I89.0 Lymphedema, not elsewhere classified I87.2 Venous insufficiency (chronic) (peripheral) L97.822 Non-pressure chronic ulcer of other part of left lower leg with fat layer exposed Follow-up Appointments o Return Appointment in 1 week. o Nurse Visit as needed - friday 07/02/2020 Edema Control - Lymphedema / Segmental Compressive Device / Other Bilateral Lower Extremities o Elevate legs to the level of the heart and pump ankles as often as possible o Elevate leg(s) parallel to the floor when sitting. Non-Wound Condition Bilateral Lower Extremities o Cleanse affected area with antibacterial soap and water, o Apply appropriate compression. - Applied Xtra sorb to draining areas, secured with kerlix. Ace wraps used due to  redness and swelling. Wound Treatment Wound #37 - Lower Leg Wound  Laterality: Left, Medial Compression Wrap: Profore LF 4 Multi-Layer Compression Bandaging System (Generic) 2 x Per Week/30 Days Discharge Instructions: Apply 4 multi-layer wrap as directed. Wound #38 - Lower Leg Wound Laterality: Left, Posterior Compression Wrap: Profore LF 4 Multi-Layer Compression Bandaging System (Generic) 2 x Per Week/30 Days Discharge Instructions: Apply 4 multi-layer wrap as directed. Wound #39 - Lower Leg Wound Laterality: Right, Lateral Cleanser: Normal Saline (Generic) 2 x Per Week/30 Days Discharge Instructions: Wash your hands with soap and water. Remove old dressing, discard into plastic bag and place into trash. Cleanse the wound with Normal Saline prior to applying a clean dressing using gauze sponges, not tissues or cotton balls. Do not scrub or use excessive force. Pat dry using gauze sponges, not tissue or cotton balls. Primary Dressing: Silvercel 4 1/4x 4 1/4 (in/in) (Generic) 2 x Per Week/30 Days Discharge Instructions: Apply Silvercel 4 1/4x 4 1/4 (in/in) as instructed Secondary Dressing: ABD Pad 5x9 (in/in) (Generic) 2 x Per Week/30 Days Discharge Instructions: Cover with ABD pad Compression Wrap: Profore LF 4 Multi-Layer Compression Bandaging System (Generic) 2 x Per Week/30 Days Discharge Instructions: Apply 4 multi-layer wrap as directed. Wound #40 - Lower Leg Wound Laterality: Left, Lateral Compression Wrap: Profore LF 4 Multi-Layer Compression Bandaging System (Generic) 2 x Per Week/30 Days Discharge Instructions: Apply 4 multi-layer wrap as directed. Electronic Signature(s) Signed: 06/28/2020 5:09:34 PM By: Melynda Ripple ([id number]) Signed: 07/01/2020 11:33:31 AM By: Carlene Coria RN Entered By: Carlene Coria on 06/28/2020 15:31:28 Adam Merritt, Adam Merritt ([id number]) -------------------------------------------------------------------------------- Problem List  Details Patient Name: Adam Merritt Date of Service: 06/28/2020 2:30 PM Medical Record Number: [id number] Patient Account Number: [id number] Date of Birth/Sex: Oct 10, 1945 (75 y.o. M) Treating RN: Cornell Barman Primary Care Provider: [name] Other Clinician: Referring Provider: [name] Treating Provider/Extender: Skipper Cliche in Treatment: 5 Active Problems ICD-10 Encounter Code Description Active Date MDM Diagnosis I89.0 Lymphedema, not elsewhere classified 05/20/2020 No Yes I87.2 Venous insufficiency (chronic) (peripheral) 05/20/2020 No Yes L97.822 Non-pressure chronic ulcer of other part of left lower leg with fat layer 05/20/2020 No Yes exposed Inactive Problems Resolved Problems Electronic Signature(s) Signed: 06/28/2020 3:18:09 PM By: Worthy Keeler PA-C Entered By: Worthy Keeler on 06/28/2020 15:18:09 Adam Merritt ([id number]) -------------------------------------------------------------------------------- Progress Note Details Patient Name: Adam Merritt Date of Service: 06/28/2020 2:30 PM Medical Record Number: [id number] Patient Account Number: [id number] Date of Birth/Sex: 1946/02/01 (75 y.o. M) Treating RN: Cornell Barman Primary Care Provider: [name] Other Clinician: Referring Provider: [name] Treating Provider/Extender: Skipper Cliche in Treatment: 5 Subjective Chief Complaint Information obtained from Patient Left leg ulcers History of Present Illness (HPI) 10/18/17-He is here for initial evaluation of bilateral lower extremity ulcerations in the presence of venous insufficiency and lymphedema. He has been seen by vascular medicine in the past, Dr. Lucky Cowboy, last seen in 2016. He does have a history of abnormal ABIs, which is to be expected given his lymphedema and venous insufficiency. According to Epic, it appears that all attempts for arterial evaluation and/or angiography were not follow through with by patient. He does have a history of  being seen in lymphedema clinic in 2018, stopped going approximately 6 months ago stating "it didn't do any good". He does not have lymphedema pumps, he does not have custom fit compression wrap/stockings. He is diabetic and his recent A1c last month was 7.6. He admits to chronic bilateral lower extremity pain, no change in pain since blister and ulceration development.  He is currently being treated with Levaquin for bronchitis. He has home health and we will continue. 10/25/17-He is here in follow-up evaluation for bilateral lower extremity ulcerationssubtle he remains on Levaquin for bronchitis. Right lower extremity with no evidence of drainage or ulceration, persistent left lower extremity ulceration. He states that home health has not been out since his appointment. He went to [facility] on Tuesday, studies revealed: RIGHT ABI 0.9, TBI 0.6 LEFT ABI 1.1, TBI 0.6 with triphasic flow bilaterally. We will continue his same treatment plan. He has been educated on compression therapy and need for elevation. He will benefit from lymphedema pumps 11/01/17-He is here in follow-up evaluation for left lower extremity ulcer. The right lower extremity remains healed. He has home health services, but they have not been out to see the patient for 2-3 weeks. He states it home health physical therapy changed his dressing yesterday after therapy; he placed Ace wrap compression. We are still waiting for lymphedema pumps, reordered d/t need for company change. 11/08/17-He is here in follow-up evaluation for left lower extremity ulcer. It is improved. Edema is significantly improved with compression therapy. We will continue with same treatment plan and he will follow-up next week. No word regarding lymphedema pumps 11/15/17-He is here in follow-up evaluation for left lower extremity ulcer. He is healed and will be discharged from wound care services. I have reached out to [facility] regarding his  lymphedema pumps. They have been unable to reach the patient; the contact number they had with the patient's wife's cell phone and she has not answered any unrecognized calls. Contact should be made today, trial planned for next week; [facility] will continue to follow 11/27/17 on evaluation today patient has multiple blistered areas over the right lower extremity his left lower extremity appears to be doing okay. These blistered areas show signs of no infection which is great news. With that being said he did have some necrotic skin overlying which was mechanically debrided away with saline and gauze today without complication. Overall post debridement the wounds appear to be doing better but in general his swelling seems to be increased. This is obviously not good news. I think this is what has given rise to the blisters. 12/04/17 on evaluation today patient presents for follow-up concerning his bilateral lower extremity edema in the right lower extremity ulcers. He has been tolerating the dressing changes without complication. With that being said he has had no real issues with the wraps which is also good news. Overall I'm pleased with the progress he's been making. 12/11/17 on evaluation today patient appears to be doing rather well in regard to his right lateral lower extremity ulcer. He's been tolerating the dressing changes without complication. Fortunately there does not appear to be any evidence of infection at this time. Overall I'm pleased with the progress that is being made. Unfortunately he has been in the hospital due to having what sounds to be a stomach virus/flu fortunately that is starting to get better. 12/18/17 on evaluation today patient actually appears to be doing very well in regard to his bilateral lower extremities the swelling is under fairly good control his lymphedema pumps are still not up and running quite yet. With that being said he does have several areas of  opening noted as far as wounds are concerned mainly over the left lower extremity. With that being said I do believe once he gets lymphedema pumps this would least help you mention some the  fluid and preventing this from occurring. Hopefully that will be set up soon sleeves are Artie in place at his home he just waiting for the machine. 12/25/17 on evaluation today patient actually appears to be doing excellent in fact all of his ulcers appear to have resolved his legs appear very well. I do think he needs compression stockings we have discussed this and they are actually going to go to [place] today to elastic therapy to get this fitted for him. I think that is definitely a good thing to do. Readmission: 04/09/18 upon evaluation today this patient is seen for readmission due to bilateral lower extremity lymphedema. He has significant swelling of his extremities especially on the left although the right is also swollen he has weeping from both sides. There are no obvious open wounds at this point. Fortunately he has been doing fairly well for quite a bit of time since I last saw him. Nonetheless unfortunately this seems to have reopened and is giving quite a bit of trouble. He states this began about a week ago when he first called [place] to get in to be seen. No fevers, chills, nausea, or vomiting noted at this time. He has not been using his lymphedema pumps due to the fact that they won't fit on his leg at this point likewise is also not been using his compression for essentially the same reason. 04/16/18 upon evaluation today patient actually appears to be doing a little better in regard to the fluid in his bilateral lower extremities. With that being said he's had three falls since I saw him last week. He also states that he's been feeling very poorly. I was concerned last week and feel like that the concern is still there as far as the congestion in his chest is concerned he seems to be breathing  about the same as last week but again he states he's very weak he's not even able to walk further than from the chair to the door. His wife had to buy a wheelchair just to be able to get them out of the house to get to the appointment today. This has me very concerned. JAYCEN, VERCHER ([id number]) 04/23/18 on evaluation today patient actually appears to be doing much better than last week's evaluation. At that time actually had to transport him to the ER via EMS and he subsequently was admitted for acute pulmonary edema, acute renal failure, and acute congestive heart failure. Fortunately he is doing much better. Apparently they did dialyze him and were able to take off roughly 35 pounds of fluid. Nonetheless he is feeling much better both in regard to his breathing and he's able to get around much better at this time compared to previous. Overall I'm very happy with how things are at this time. There does not appear to be any evidence of infection currently. No fevers, chills, nausea, or vomiting noted at this time. 04/30/2018 patient seen today for follow-up and management of bilateral lower extremity lymphedema. He did express being more sad today than usual due to the recent loss of his dog. He states that he has been compliant with using the lymphedema pumps. However he does admit a minute over the last 2-3 days he has not been using the pumps due to the recent loss of his dog. At this time there is no drainage or open wounds to his lower extremities. The left leg edema is measuring smaller today. Still has a significant amount of edema on bilateral  lower extremities With dry flaky skin. He will be referred to the lymphedema clinic for further management. Will continue 3 layer compression wraps and follow-up in 1-2 weeks.Denies any pain, fever, chairs recently. No recent falls or injuries reported during this visit. 05/07/18 on evaluation today patient actually appears to be doing very well in  regard to his lower extremities in general all things considered. With that being said he is having some pain in the legs just due to the amount of swelling. He does have an area where he had a blister on the left lateral lower extremity this is open at this point other than that there's nothing else weeping at this time. 05/14/18 on evaluation today patient actually appears to be doing excellent all things considered in regard to his lower extremities. He still has a couple areas of weeping on each leg which has continued to be the issue for him. He does have an appointment with the lymphedema clinic although this isn't until February 2020. That was the earliest they had. In the meantime he has continued to tolerate the compression wraps without complication. 05/28/18 on evaluation today patient actually appears to be doing more poorly in regard to his left lower extremity where he has a wound open at this point. He also had a fall where he subsequently injured his right great toe which has led to an open wound at the site unfortunately. He has been tolerating the dressing changes without complication in general as far as the wraps are concerned that he has not been putting any dressing on the left 1st toe ulcer site. 06/11/18 on evaluation today patient appears to be doing much worse in regard to his bilateral lower extremity ulcers. He has been tolerating the dressing changes without complication although his legs have not been wrapped more recently. Overall I am not very pleased with the way his legs appear. I do believe he needs to be back in compression wraps he still has not received his compression wraps from the [facility] hospital as of yet. 06/18/18 on evaluation today patient actually appears to be doing significantly better than last time I saw him. He has been tolerating the compression wraps without complication in the circumferential ulcers especially appear to be doing much better. His toe ulcer  on the right in regard to the great toe is better although not as good as the legs in my pinion. No fevers chills noted 07/02/18 on evaluation today patient appears to be doing much better in regard to his lower extremity ulcers. Unfortunately since I last saw him he's had the distal portion of his right great toe if he dated it sounds as if this actually went downhill very quickly. I had only seen him a few days prior and the toe did not appear to be infected at that point subsequently became infected very rapidly and it was decided by the surgeon that the distal portion of the toe needed to be removed. The patient seems to be doing well in this regard he tells me. With that being said his lower extremities are doing better from the standpoint of the wounds although he is significantly swollen at this point. 07/09/18 on evaluation today patient appears to be doing better in regard to the wounds on his lower extremities. In fact everything is almost completely healed he is just a small area on the left posterior lower extremity that is open at this point. 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 With that being said he needs some kind of compression it does sound like he may not be wearing his compression, that is the wraps, during the entire time between when he's here visit to visit. Apparently his wife took the current one off because it began to "fall apart". 07/16/18 on evaluation today patient appears to be extremely swollen especially in regard to his left lower extremity unfortunately. He also has a new skin tear over the left lower extremity and there's a smaller area on the right lower extremity as well. Unfortunately this seems to be due in part to blistering and fluid buildup in his leg. He did get the  reduction wraps that were ordered by the [facility] hospital for him to go to lymphedema clinic. With that being said his wounds on the legs have not healed to the point to where they would likely accept them as a patient lymphedema clinic currently. We need to try to get this to heal. With that being said he's been taking his wraps off which is not doing him any favors at this point. In fact this is probably quite counterproductive compared to what needs to occur. We will likely need to increase to a four layer compression wrap and continue to also utilize elevation and he has to keep the wraps on not take them off as he's been doing currently hasn't had a wrap on since Saturday. 07/23/18 on evaluation today patient appears to be doing much better in regard to his bilateral lower extremities. In fact his left lower extremity which was the largest is actually 15 cm smaller today compared to what it was last time he was here in our clinic. This is obviously good news after just one week. Nonetheless the differences he actually kept the wraps on during the entire week this time. That's not typical for him. I do believe he understands a little bit better now the severity of the situation and why it's important for him to keep these wraps on. 07/30/18 on evaluation today patient actually appears to be doing rather well in regard to his lower extremities. His legs are much smaller than they have been in the past and he actually has only one very small rudder superficial region remaining that is not closed on the left lateral/posterior lower extremity even this is almost completely close. I do believe likely next week he will be healed without any complications. I do think we need to continue the wraps however this seems to be beneficial for him. I also think it may be a good time for [place] to go ahead and see about getting the appointment with the lymphedema clinic which is supposed to be made for him in order to keep  this moving along and hopefully get them into compression wraps that will in the end help him to remain healed. 08/06/18 on evaluation today patient actually appears to be doing very well in regard as bilateral lower extremities. In fact his wounds appear to be completely healed at this time. 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 He is in the process of getting appointment with the lymphedema clinic we have made this referral were just waiting to hear back on the schedule time. We need to follow up on that today as well. 08/13/18 on  evaluation today patient actually appears to be doing very well in regard to his bilateral lower extremities there are no open wounds at this point. We are gonna go ahead and see about ordering the Velcro compression wraps for him had a discussion with them about [place] doing it versus the [place] they feel like they can definitely afford going ahead and get the wraps themselves and they would prefer to try to avoid having to go to the lymphedema clinic if it all possible which I completely understand. As long as he has good compression I'm okay either way. Adam Merritt, Adam Merritt ([id number]) 3/17//20 on evaluation today patient actually appears to be doing well in regard to his bilateral lower extremity ulcers. He has been tolerating the dressing changes without complication specifically the compression wraps. Overall is had no issues my fingers finding that I see at this point is that he is having trouble with constipation. He tells me he has not been able to go to the bathroom for about six days. He's taken to over-the- counter oral laxatives unfortunately this is not helping. He has not contacted his doctor. 08/27/18 on evaluation today patient appears to be doing fairly well in regard to his lower extremities at this point. There does not appear to be any new altars is swelling is very well controlled. We are still waiting  for his Velcro compression wraps to arrive that should be sometime in the next week is Artie sent the check for these. 09/03/18 on evaluation today patient appears to be doing excellent in regard to his bilateral lower extremities which he shows no signs of wound openings in regard to this point. He does have his Velcro compression wraps which did arrive in the mail since I saw him last week. Overall he is doing excellent in my pinion. 09/13/18 on evaluation today patient appears to be doing very well currently in regard to the overall appearance of his bilateral lower extremities although he's a little bit more swollen than last time we saw him. At that point he been discharged without any open wounds. Nonetheless he has a small open wound on the posterior left lower extremity with some evidence of cellulitis noted as well. Fortunately I feel like he has made good progress overall with regard to his lower extremities from were things used to be. 09/20/18 on evaluation today patient actually appears to be doing much better. The erythematous lower extremity is improving wound itself which is still open appears to be doing much better as far as but appearance as well as pain is concerned overall very pleased in this regard. There's no signs of active infection at this time. 09/27/18 on evaluation today patient's wounds on the lower extremity actually appear to be doing fairly well at this time which is good news. There is no evidence of active infection currently and again is just as left lower extremity were there any wounds at this point anyway. I believe they may be completely healed but again I'm not 100% sure based on evaluation today. I think one more week of observation would likely be a good idea. 10/04/18 on evaluation today patient actually appears to be doing excellent in regard to the left lower extremity which actually appears to be completely healed as of today. Unfortunately he's been having  issues with his right lower extremity have a new wound that has opened. Fortunately there's no evidence of active infection at this time which is good news. 10/10/18 upon evaluation today patient  actually appears to be doing a little bit worse with the new open area on his right posterior lower extremity. He's been tolerating the dressing changes without complication. Right now we been using his compression wraps although I think we may need to switch back to actually performing bilateral compression wraps are in the clinic. No fevers, chills, nausea, or vomiting noted at this time. 10/17/18 on evaluation today patient actually appears to be doing quite well in regard to his bilateral lower extremity ulcers. He has been tolerating the reps without complication although he would prefer not to rewrap his legs as of today. Fortunately there's no signs of active infection which is good news. No fevers, chills, nausea, or vomiting noted at this time. 10/24/18 on evaluation today patient appears to be doing very well in regard to his lower extremities. His right lower extremity is shown signs of healing and his left lower Trinity though not healed appears to be improving which is excellent news. Overall very pleased with how things seem to be progressing at this time. The patient is likewise happy to hear this. His Velcro compression wraps however have not been put on properly will gonna show his wife how to do that properly today. 10/31/18 on evaluation today patient appears to be doing more poorly in regard to his left lower extremity in particular although both lower extremities actually are showing some signs of being worse than my previous evaluation. Unfortunately I'm just not sure that his compression stockings even with the use of the compression/lymphedema pumps seem to be controlling this well. Upon further questioning he tells me that he also is not able to lie flat in the bed due to his congestive  heart failure and difficulty breathing. For that reason he sleeps in his recliner. To make matters worse his recliner also cannot even hold his legs up so instead of even being somewhat elevated they pretty much hang down to the floor. This is the way he sleeps each night which is definitely counterproductive to everything else that were attempting to do from the standpoint of controlling his fluid. Nonetheless I think that he potentially could benefit from a hospital bed although this would be something that his primary care provider would likely have to order since anything that is order on our side has to be directly related to wound care and again the hospital bed is not necessarily a direct relation to although I think it does contribute to his overall wound status on his lower extremities. 11/07/18 on evaluation today patient appears to be doing better in regard to his bilateral lower extremity. He's been tolerating the dressing changes without complication. We did in the interim since I last saw him switch to just using extras orbiting new alginate and that seems to have done much better for him. I'm very pleased with the overall progress that is made. 11/14/18 on evaluation today patient appears to be redoing rather well in regard to his left lower extremity ulcers which are the only ones remaining at this point. Fortunately there's no signs of active infection at this time which is good news. Overall been very pleased with how things seem to be progressing currently. No fevers, chills, nausea, or vomiting noted at this time. 11/20/17 on evaluation today patient actually appears to be doing quite well in regard to his lower extremities on the left on the right he has several blisters that showed up although there's some question about whether or not he has had  his broker compression wraps on like he was supposed to or not. Fortunately there's no signs of active infection at this time which is good  news. Unfortunately though he is doing well from the standpoint of the left leg the right leg is not doing as well again this is when we did not wrap last week. 11/28/18 on evaluation today patient appears to be doing well in regard to his bilateral lower extremities is left appears to be healed is right is not healed but is very close to being so. Overall very pleased with how things seem to be progressing. Patient is likewise happy that things are doing well. 12/09/18 on evaluation today patient actually appears to be completely healed which is excellent news. He actually seems to be doing well in regard to the swelling of the bilateral lower extremities which is also great news. Overall very pleased with how things seem to be progressing. Readmission: 03/18/2019 upon evaluation today patient presents for reevaluation concerning issues that he is having with his left lower extremity where he does have a wound noted upon inspection today. He also has a wound on the right second toe on the tip where it is apparently been rubbing in his shoe I did look at issues and you can actually see where this has been occurring as well. Fortunately there is no signs of infection with regard to Adam Merritt, Adam Merritt. ([id number]) the toe necessarily although it is very swollen compared to normal that does have me somewhat concerned about the possibility of further evaluation for infection/osteomyelitis. I would recommend an x-ray to start with. Otherwise he states that it is been 1-2 weeks that he has had the draining in regard to left lower extremity he does not know how this happened he has been wearing his compression appropriately and I do see that as well today but nonetheless I do think that he needs to continue to be very cautious with regard to elevation as well. 03/25/2019 on evaluation today patient appears to be doing much better in regard to his lower extremity at this time. That is on the left. He did culture  positive for Pseudomonas but the good news is he seems to be doing much better the gentamicin we applied topically seems to have done a great job for him. There is no signs of active infection at this time systemically and locally he is doing much better. No fevers, chills, nausea, vomiting, or diarrhea. In regard to his right toe this seems to be doing much better there is very little pressure noted at this point I did clean up some of the callus today around the edges of the wound as well as the surface of the wound but to be honest he is progressing quite nicely. 10/30; the area on the left anterior lower tibia area is healed over. His edema control is adequate even though his use of the compression pumps seems very intermittent. He has a juxta lite stocking on the right leg and a think there is 1 for the left leg at home. His wife is putting these on. He has an area on the plantar right second toe which is a hammertoe they are trying to offload this 04/08/2019 on evaluation today patient appears to be doing well in regard to his lower extremities which is good news. We are seeing him today for his toe ulcer which is giving him a little bit more trouble but again still seems to be doing better at this  point which is good news. He is going require some sharp debridement in regard to the toe today however. 04/15/2019 on evaluation today patient actually appears to be doing quite well with regard to his toe ulcer. I am very pleased with how things are doing in that regard. With regard to his lower extremity edema in general he tells me he is not been using the lymphedema pumps regularly although he does not use them. I think that he needs to use them more regularly he does have a small blister on the left lower extremity this is not open at this point but obviously it something that can get worse if he does not keep the compression going and the pumps going as well. 04/22/2019 on evaluation today  patient appears to be doing well with regard to his toe ulcer. He has been tolerating the dressing changes without complication. This is measuring slightly smaller this week as compared to last week. Fortunately there is no signs of active infection at this time. 05/06/2019 on evaluation today patient actually appears to be doing excellent. In fact his toe appears to be completely healed which is great news. There is no signs of active infection at this time. Readmission: Patient presents today for follow-up after having been discharged at the beginning of December 2020. He states that in recent weeks things have reopened and has been having more trouble at this point. With that being said fortunately there is no signs of active infection at this time. No fever chills noted. Nonetheless he also does have an issue with one of his toes as well that seems to be somewhat this is on the right foot second toe. 07/25/2019 upon evaluation today patient's lower extremities appear to be doing excellent bilaterally. I really feel like he is showing signs of great improvement and in fact I think he is close to healing which is great news. There is no signs of active infection at this time. No fevers, chills, nausea, vomiting, or diarrhea. 07/31/2019 upon evaluation today patient appears to be doing excellent and in fact I think may be completely healed in regard to his right lower extremity that were still to monitor this 1 more week before closing it out. The left lower extremity is measuring smaller although not completely closed seems to be doing excellent. 08/07/2019 upon evaluation today patient appears to be doing excellent in regard to his wounds. In fact the right lower extremity is completely healed there is no issues here. The left lower extremity has 1 very small area still remaining fortunately there is no signs of infection and overall I think he is very close to closure of the here as well. 08/14/19  upon evaluation today patient appears to be doing excellent in regard to his lower extremities. In fact he appears to be completely healed as of today. Fortunately there's no signs of active infection at this time. No fevers, chills, nausea, or vomiting noted at this time. READMISSION 09/26/2019 This is a 75 year old man who is well-known to this clinic having just been discharged on 08/14/2019. He has known lymphedema and chronic venous insufficiency. He has Farrow wrap stockings and also external compression pumps. He does not use the external compression pumps however. He does however apparently fairly faithfully use the stockings. They have not been lotion in his legs. He has developed new wounds on the right anterior as well as the left medial left lateral and left posterior calf. He returns for our review of this  Past medical history includes coronary artery disease, hypertension, congestive heart failure, COPD, venous insufficiency with lymphedema, type 2 diabetes. He apparently has compression pumps, Farrow wraps and at 1 point a hospital bed although I believe he sleeps in a recliner 10/03/2019 upon evaluation today patient appears to be doing better with regard to his wounds in general. He has been tolerating the dressing changes without complication. Fortunately there is no signs of active infection. No fevers, chills, nausea, vomiting, or diarrhea. I do believe the compression wraps are helping as they always have in the past. 10/10/2019 upon evaluation today patient appears to be doing excellent at this point. His right lower extremity is measuring much better and in fact is completely healed. His left lower extremity is also measuring better though not healed seems to be doing excellent at this point which is great news. Overall I am very pleased with how things appear currently. 10/27/2019 upon evaluation today patient appears to be doing quite well with regard to his wounds currently. He  has been tolerating the dressing changes without complication. Fortunately there is no signs of active infection at this time. In fact he is almost completely healed on the left and has just a very small area open and on the right he is completely healed. 11/04/2019 upon evaluation today patient appears to be doing quite well with regard to his wounds. In fact he appears to be quite possibly completely healed on the left lower extremity now as well the right lower extremity is still doing well. With that being said unfortunately though this may be healed I think it is a very fragile area and I cannot even confirm there is not a very small opening still remaining. I really think he may benefit from 1 additional weeks wrapping before discontinuing. Adam Merritt, Adam Merritt ([id number]) 11/10/2019 upon evaluation today patient appears to be doing well in regard to his original wounds in fact everything that we were treating last week is completely healed on the left. Unfortunately he has a new skin tear just above where the wrap slipped down due to a fall he sustained causing this injury. 11/17/2019 on evaluation today patient appears to be doing better with regard to his wound. He has been tolerating the dressing changes without complication. Fortunately there is no signs of active infection at this time. No fevers, chills, nausea, vomiting, or diarrhea. 11/24/2019 upon evaluation today patient appears to be doing well with regard to his wound. This is making good progress there does not appear to be signs of active infection but overall I do feel like he is headed in the correct direction. Overall he is tolerating the compression wrap as well. 12/02/2019 upon evaluation today patient appears to be doing well with regard to his leg ulcer. In fact this appears to be completely healed and this is the last of the wounds that he had but still the left anterior lower extremity. Fortunately there is no signs of active  infection at this time. No fevers, chills, nausea, vomiting, or diarrhea. Readmission: 02/05/2020 on evaluation today patient appears to be doing well with regard to his wound all things considered. He actually tells me that a couple weeks ago he had trouble with his wraps and therefore took him off for a couple of days in order to give his legs a break. It was during this time that he actually developed increased swelling and edema of his legs and blisters on both legs. That is when he called to  make the appointment. Nonetheless since that time he began wearing his compression wraps which are Velcro in the meantime and has done extremely well with this. In fact his leg appears to be under good control as far as edema is concerned today it is nothing like what I am seeing when he has come in for evaluations in the past. They have been using some of the silver alginate at home which has been beneficial for him. Fortunately there is no signs of active infection at this time. No fevers, chills, nausea, vomiting, or diarrhea. 02/19/2020 on evaluation today patient unfortunately does not appear to be doing quite as well as I would like to see. He has no signs of active infection at this time but unfortunately he is not doing well in regard to the overall appearance of his left leg. He has a couple other areas that are weeping now and the leg is much more swollen. I think that he needs to actually have a compression wrap to try to manage this. 02/26/2020 on evaluation today patient appears to be doing well with regard to his left lower extremity ulcer. Fortunately the swelling is down I do believe the pressure wrap seems to be doing quite well. Fortunately there is no signs of active infection at this time. 03/05/2020 upon evaluation today patient appears to be doing excellent in regard to his legs at this point. Fortunately there is no signs of active infection which is great news. Overall he feels like he is  completely healed which is great news. Readmission: 05/20/2020 upon evaluation today patient appears to be doing well with regard to his leg ulcer all things considered. He is actually coming back to see [place] after having had a reopening of the ulcers on his left leg in the past several weeks. He is out of dressing supplies and his wife is been try to take care of this as best she can. 06/10/2020 upon evaluation today patient unfortunately appears to be doing significantly worse today compared to when I last saw him. He and his wife both tell me that "there has been a lot going on". I did not actually go into detail about everything that has been happening but nonetheless he has not obviously been wearing his compression. He brings them with him today but they were not on. I am not even certain to be honest that he could get those on at this point. Nonetheless I do believe that he is going to require compression wrapping at some point shortly but right now we need to try to get what appears to be infection under control at this time. 06/28/2020 upon evaluation today patient appears to be doing well with regard to 06/28/2020 upon evaluation today patient appears to be doing well pretty well in regard to his left leg which is much better than previous. With that being said in regard to his right leg he does seem to be having some issues with a new wound after he sustained a fall. This fortunately is not too deep but does appear to be something we need to address. Neither deep which is good. Objective Constitutional Obese and well-hydrated in no acute distress. Vitals Time Taken: 2:50 PM, Height: 66 in, Weight: 288 lbs, BMI: 46.5, Temperature: 98.4 F, Pulse: 65 bpm, Respiratory Rate: 20 breaths/min, Blood Pressure: 143/84 mmHg. Respiratory normal breathing without difficulty. Psychiatric this patient is able to make decisions and demonstrates good insight into disease process. Alert and Oriented x 3.  pleasant and cooperative. General Notes: On inspection patient's wound bed actually showed signs of good granulation at this time. There does not appear to be any signs of active infection which is great news and overall very pleased with where things stand. No fevers, chills, nausea, vomiting, or diarrhea. Adam Merritt, Adam Merritt ([id number]) Integumentary (Hair, Skin) Wound #37 status is Open. Original cause of wound was Gradually Appeared. The wound is located on the Left,Medial Lower Leg. The wound measures 0.1cm length x 0.1cm width x 0.1cm depth; 0.008cm^2 area and 0.001cm^3 volume. There is no tunneling or undermining noted. There is a none present amount of drainage noted. There is no granulation within the wound bed. There is no necrotic tissue within the wound bed. Wound #38 status is Open. Original cause of wound was Gradually Appeared. The wound is located on the Left,Posterior Lower Leg. The wound measures 0.1cm length x 0.1cm width x 0.1cm depth; 0.008cm^2 area and 0.001cm^3 volume. There is no tunneling or undermining noted. There is no granulation within the wound bed. There is no necrotic tissue within the wound bed. Wound #39 status is Open. Original cause of wound was Skin Tear/Laceration. The wound is located on the Right,Lateral Lower Leg. The wound measures 5.5cm length x 7.2cm width x 0.1cm depth; 31.102cm^2 area and 3.11cm^3 volume. There is Fat Layer (Subcutaneous Tissue) exposed. There is no tunneling or undermining noted. There is a large amount of serosanguineous drainage noted. There is large (67-100%) red, pink granulation within the wound bed. There is no necrotic tissue within the wound bed. Wound #40 status is Open. Original cause of wound was Gradually Appeared. The wound is located on the Left,Lateral Lower Leg. The wound measures 7.5cm length x 9.5cm width x 0.1cm depth; 55.96cm^2 area and 5.596cm^3 volume. There is Fat Layer (Subcutaneous Tissue) exposed. There is no  tunneling or undermining noted. There is a large amount of serous drainage noted. There is large (67-100%) pink, pale granulation within the wound bed. There is no necrotic tissue within the wound bed. Assessment Active Problems ICD-10 Lymphedema, not elsewhere classified Venous insufficiency (chronic) (peripheral) Non-pressure chronic ulcer of other part of left lower leg with fat layer exposed Procedures Wound #37 Pre-procedure diagnosis of Wound #37 is a Lymphedema located on the Left,Medial Lower Leg . There was a Four Layer Compression Therapy Procedure by Carlene Coria, RN. Post procedure Diagnosis Wound #37: Same as Pre-Procedure Wound #38 Pre-procedure diagnosis of Wound #38 is a Lymphedema located on the Left,Posterior Lower Leg . There was a Four Layer Compression Therapy Procedure by Carlene Coria, RN. Post procedure Diagnosis Wound #38: Same as Pre-Procedure Wound #39 Pre-procedure diagnosis of Wound #39 is a Lymphedema located on the Right,Lateral Lower Leg . There was a Four Layer Compression Therapy Procedure by Carlene Coria, RN. Post procedure Diagnosis Wound #39: Same as Pre-Procedure Wound #40 Pre-procedure diagnosis of Wound #40 is a Lymphedema located on the Left,Lateral Lower Leg . There was a Four Layer Compression Therapy Procedure by Carlene Coria, RN. Post procedure Diagnosis Wound #40: Same as Pre-Procedure Plan Follow-up Appointments: Return Appointment in 1 week. Nurse Visit as needed - friday 07/02/2020 Edema Control - Lymphedema / Segmental Compressive Device / Other: Elevate legs to the level of the heart and pump ankles as often as possible Elevate leg(s) parallel to the floor when sitting. Adam Merritt, Adam Merritt ([id number]) Non-Wound Condition: Cleanse affected area with antibacterial soap and water, Apply appropriate compression. - Applied Xtra sorb to draining areas, secured with kerlix. Ace wraps  used due to redness and swelling. WOUND #37: - Lower  Leg Wound Laterality: Left, Medial Compression Wrap: Profore LF 4 Multi-Layer Compression Bandaging System (Generic) 2 x Per Week/30 Days Discharge Instructions: Apply 4 multi-layer wrap as directed. WOUND #38: - Lower Leg Wound Laterality: Left, Posterior Compression Wrap: Profore LF 4 Multi-Layer Compression Bandaging System (Generic) 2 x Per Week/30 Days Discharge Instructions: Apply 4 multi-layer wrap as directed. WOUND #39: - Lower Leg Wound Laterality: Right, Lateral Cleanser: Normal Saline (Generic) 2 x Per Week/30 Days Discharge Instructions: Wash your hands with soap and water. Remove old dressing, discard into plastic bag and place into trash. Cleanse the wound with Normal Saline prior to applying a clean dressing using gauze sponges, not tissues or cotton balls. Do not scrub or use excessive force. Pat dry using gauze sponges, not tissue or cotton balls. Primary Dressing: Silvercel 4 1/4x 4 1/4 (in/in) (Generic) 2 x Per Week/30 Days Discharge Instructions: Apply Silvercel 4 1/4x 4 1/4 (in/in) as instructed Secondary Dressing: ABD Pad 5x9 (in/in) (Generic) 2 x Per Week/30 Days Discharge Instructions: Cover with ABD pad Compression Wrap: Profore LF 4 Multi-Layer Compression Bandaging System (Generic) 2 x Per Week/30 Days Discharge Instructions: Apply 4 multi-layer wrap as directed. WOUND #40: - Lower Leg Wound Laterality: Left, Lateral Compression Wrap: Profore LF 4 Multi-Layer Compression Bandaging System (Generic) 2 x Per Week/30 Days Discharge Instructions: Apply 4 multi-layer wrap as directed. 1. Would recommend currently that we go ahead and initiate a continuation of treatment with the 4-layer compression wrap at this point I think his infection is under control which is great news. We will utilize the silver alginate for the right leg only otherwise we will just use an XtraSorb over both areas. 2. I am also can recommend the patient needs to continue to elevate his legs  much as possible. 3. I did discuss with very careful with walking. He needs to ensure that he does not fall as this is very dangerous for him. Therefore I did recommend that the patient go ahead and make sure he uses his walker wherever he goes and subsequently should be walking slowly and taking his time at all time. We will see patient back for reevaluation in 1 week here in the clinic. If anything worsens or changes patient will contact our office for additional recommendations. We will see him for nurse visit on Friday since his wraps tend to slide. Once we get his leg under control from a size perspective with fluid we will subsequently be able to hopefully cut back on this. Then we will just see him once a week. Electronic Signature(s) Signed: 06/28/2020 3:47:28 PM By: Worthy Keeler PA-C Entered By: Worthy Keeler on 06/28/2020 15:47:27 Adam Merritt ([id number]) -------------------------------------------------------------------------------- SuperBill Details Patient Name: Adam Merritt Date of Service: 06/28/2020 Medical Record Number: [id number] Patient Account Number: [id number] Date of Birth/Sex: Oct 16, 1945 (75 y.o. M) Treating RN: Carlene Coria Primary Care Provider: [name] Other Clinician: Referring Provider: [name] Treating Provider/Extender: Skipper Cliche in Treatment: 5 Diagnosis Coding ICD-10 Codes Code Description I89.0 Lymphedema, not elsewhere classified I87.2 Venous insufficiency (chronic) (peripheral) L97.822 Non-pressure chronic ulcer of other part of left lower leg with fat layer exposed Facility Procedures CPT4: Description Modifier Quantity Code [id number] BILATERAL: Application of multi-layer venous compression system; leg (below knee), including 1 ankle and foot. Physician Procedures CPT4 Code: [id number] Description: 95284 - WC PHYS LEVEL 3 - EST PT Modifier: Quantity: 1 CPT4 Code: Description: ICD-10 Diagnosis  Description I89.0  Lymphedema, not elsewhere classified I87.2 Venous insufficiency (chronic) (peripheral) L97.822 Non-pressure chronic ulcer of other part of left lower leg with fat lay Modifier: er exposed Quantity: Electronic Signature(s) Signed: 06/28/2020 3:47:54 PM By: Worthy Keeler PA-C Entered By: Worthy Keeler on 06/28/2020 15:47:53

## 2020-07-01 NOTE — Progress Notes (Signed)
LISANDRO, MEGGETT ([id number]) Visit Report for 06/28/2020 Arrival Information [facility] Patient Name: Adam Merritt, Adam Merritt Date of Service: 06/28/2020 2:30 PM Medical Record Number: [id number] Patient Account Number: [id number] Date of Birth/Sex: 1945-11-26 (75 y.o. M) Treating RN: Cornell Barman Primary Care Minola Guin: [name] Other Clinician: Referring Destiny Hagin: [name] Treating Rober Skeels/Extender: Skipper Cliche in Treatment: 5 Visit Information History Since Last Visit Added or deleted any medications: No Patient Arrived: Wheel Chair Any new allergies or adverse reactions: No Arrival Time: 14:49 Had a fall or experienced change in No Accompanied By: wife activities of daily living that may affect Transfer Assistance: None risk of falls: Patient Identification Verified: Yes Signs or symptoms of abuse/neglect since last visito No Secondary Verification Process Completed: Yes Hospitalized since last visit: No Implantable device outside of the clinic excluding No cellular tissue based products placed in the center since last visit: Has Dressing in Place as Prescribed: No Has Compression in Place as Prescribed: No Pain Present Now: No Electronic Signature(s) Signed: 06/28/2020 4:33:55 PM By: Lorine Bears RCP, RRT, CHT Entered By: Lorine Bears on 06/28/2020 14:50:43 Adam Merritt ([id number]) -------------------------------------------------------------------------------- Clinic Level of Care Assessment [facility] Patient Name: Adam Merritt Date of Service: 06/28/2020 2:30 PM Medical Record Number: [id number] Patient Account Number: [id number] Date of Birth/Sex: 01/19/1946 (75 y.o. M) Treating RN: Carlene Coria Primary Care Keiston Manley: [name] Other Clinician: Referring Statia Burdick: [name] Treating Taylinn Brabant/Extender: Skipper Cliche in Treatment: 5 Clinic Level of Care Assessment Items TOOL 1 Quantity Score []  - Use when EandM and Procedure is  performed on INITIAL visit 0 ASSESSMENTS - Nursing Assessment / Reassessment []  - General Physical Exam (combine w/ comprehensive assessment (listed just below) when performed on new 0 pt. evals) []  - 0 Comprehensive Assessment (HX, ROS, Risk Assessments, Wounds Hx, etc.) ASSESSMENTS - Wound and Skin Assessment / Reassessment []  - Dermatologic / Skin Assessment (not related to wound area) 0 ASSESSMENTS - Ostomy and/or Continence Assessment and Care []  - Incontinence Assessment and Management 0 []  - 0 Ostomy Care Assessment and Management (repouching, etc.) PROCESS - Coordination of Care []  - Simple Patient / Family Education for ongoing care 0 []  - 0 Complex (extensive) Patient / Family Education for ongoing care []  - 0 Staff obtains Programmer, systems, Records, Test Results / Process Orders []  - 0 Staff telephones HHA, Nursing Homes / Clarify orders / etc []  - 0 Routine Transfer to another Facility (non-emergent condition) []  - 0 Routine Hospital Admission (non-emergent condition) []  - 0 New Admissions / Biomedical engineer / Ordering NPWT, Apligraf, etc. []  - 0 Emergency Hospital Admission (emergent condition) PROCESS - Special Needs []  - Pediatric / Minor Patient Management 0 []  - 0 Isolation Patient Management []  - 0 Hearing / Language / Visual special needs []  - 0 Assessment of Community assistance (transportation, D/C planning, etc.) []  - 0 Additional assistance / Altered mentation []  - 0 Support Surface(s) Assessment (bed, cushion, seat, etc.) INTERVENTIONS - Miscellaneous []  - External ear exam 0 []  - 0 Patient Transfer (multiple staff / Civil Service fast streamer / Similar devices) []  - 0 Simple Staple / Suture removal (25 or less) []  - 0 Complex Staple / Suture removal (26 or more) []  - 0 Hypo/Hyperglycemic Management (do not check if billed separately) []  - 0 Ankle / Brachial Index (ABI) - do not check if billed separately Has the patient been seen at the hospital within  the last three years: Yes Total Score: 0 Level Of Care: ____ Adam Merritt  Adam Merritt ([id number]) Electronic Signature(s) Signed: 07/01/2020 11:33:31 AM By: Carlene Coria RN Entered By: Carlene Coria on 06/28/2020 15:31:39 Adam Merritt ([id number]) -------------------------------------------------------------------------------- Compression Therapy [facility] Patient Name: Adam Merritt Date of Service: 06/28/2020 2:30 PM Medical Record Number: [id number] Patient Account Number: [id number] Date of Birth/Sex: April 26, 1946 (75 y.o. M) Treating RN: Carlene Coria Primary Care Maudy Yonan: [name] Other Clinician: Referring Hamsa Laurich: [name] Treating Iolanda Folson/Extender: Skipper Cliche in Treatment: 5 Compression Therapy Performed for Wound Assessment: Wound #37 Left,Medial Lower Leg Performed By: Clinician Carlene Coria, RN Compression Type: Four Layer Post Procedure Diagnosis Same as Pre-procedure Electronic Signature(s) Signed: 07/01/2020 11:33:31 AM By: Carlene Coria RN Entered By: Carlene Coria on 06/28/2020 15:31:03 Adam Merritt ([id number]) -------------------------------------------------------------------------------- Compression Therapy [facility] Patient Name: Adam Merritt Date of Service: 06/28/2020 2:30 PM Medical Record Number: [id number] Patient Account Number: [id number] Date of Birth/Sex: Dec 14, 1945 (75 y.o. M) Treating RN: Carlene Coria Primary Care Breland Elders: [name] Other Clinician: Referring Denessa Cavan: [name] Treating Abbee Cremeens/Extender: Skipper Cliche in Treatment: 5 Compression Therapy Performed for Wound Assessment: Wound #38 Left,Posterior Lower Leg Performed By: Clinician Carlene Coria, RN Compression Type: Four Layer Post Procedure Diagnosis Same as Pre-procedure Electronic Signature(s) Signed: 07/01/2020 11:33:31 AM By: Carlene Coria RN Entered By: Carlene Coria on 06/28/2020 15:31:03 Adam Merritt  ([id number]) -------------------------------------------------------------------------------- Compression Therapy [facility] Patient Name: Adam Merritt Date of Service: 06/28/2020 2:30 PM Medical Record Number: [id number] Patient Account Number: [id number] Date of Birth/Sex: [birth year] (75 y.o. M) Treating RN: Carlene Coria Primary Care Takeira Yanes: [name] Other Clinician: Referring Baylie Drakes: [name] Treating Nuchem Grattan/Extender: Skipper Cliche in Treatment: 5 Compression Therapy Performed for Wound Assessment: Wound #39 Right,Lateral Lower Leg Performed By: Clinician Carlene Coria, RN Compression Type: Four Layer Post Procedure Diagnosis Same as Pre-procedure Electronic Signature(s) Signed: 07/01/2020 11:33:31 AM By: Carlene Coria RN Entered By: Carlene Coria on 06/28/2020 15:31:03 Adam Merritt ([id number]) -------------------------------------------------------------------------------- Compression Therapy [facility] Patient Name: Adam Merritt Date of Service: 06/28/2020 2:30 PM Medical Record Number: [id number] Patient Account Number: [id number] Date of Birth/Sex: 11-18-1945 (75 y.o. M) Treating RN: Carlene Coria Primary Care Rafferty Postlewait: [name] Other Clinician: Referring Harlen Danford: [name] Treating Khloi Rawl/Extender: Skipper Cliche in Treatment: 5 Compression Therapy Performed for Wound Assessment: Wound #40 Left,Lateral Lower Leg Performed By: Clinician Carlene Coria, RN Compression Type: Four Layer Post Procedure Diagnosis Same as Pre-procedure Electronic Signature(s) Signed: 07/01/2020 11:33:31 AM By: Carlene Coria RN Entered By: Carlene Coria on 06/28/2020 15:31:03 Adam Merritt ([id number]) -------------------------------------------------------------------------------- Encounter Discharge Information [facility] Patient Name: Adam Merritt Date of Service: 06/28/2020 2:30 PM Medical Record Number: [id number] Patient Account Number: [id number] Date of Birth/Sex:  21-May-1946 (74 y.o. M) Treating RN: Carlene Coria Primary Care Cosme Jacob: [name] Other Clinician: Referring Cono Gebhard: [name] Treating Lundynn Cohoon/Extender: Skipper Cliche in Treatment: 5 Encounter Discharge Information Items Discharge Condition: Stable Ambulatory Status: Wheelchair Discharge Destination: Home Transportation: Private Auto Accompanied By: self Schedule Follow-up Appointment: Yes Clinical Summary of Care: Patient Declined Electronic Signature(s) Signed: 07/01/2020 11:33:31 AM By: Carlene Coria RN Entered By: Carlene Coria on 06/28/2020 15:57:04 Adam Merritt ([id number]) -------------------------------------------------------------------------------- Lower Extremity Assessment Merr[facility] Patient Name: Adam Merritt Date of Service: 06/28/2020 2:30 PM Medical Record Number: [id number] Patient Account Number: [id number] Date of Birth/Sex: [birth year] (75 y.o. M) Treating RN: Dolan Amen Primary Care Mishal Probert: [name] Other Clinician: Referring Lamount Bankson: [name] Treating Vita Currin/Extender: Jeri Cos [name]: 5 Edema Assessment Assessed: [Left: Yes] [Right: Yes] Edema: [Left: Yes] [Right: Yes] Calf  Left: Right: Point of Measurement: 30 cm From Medial Instep 57 cm 49.5 cm Ankle Left: Right: Point of Measurement: 10 cm From Medial Instep 37.5 cm 32 cm Electronic Signature(s) Signed: 06/28/2020 4:33:18 PM By: Georges Mouse, [name] Breeding RN Entered By: Georges Mouse, [name] Breeding on 06/28/2020 15:17:05 Adam Merritt ([id number]) -------------------------------------------------------------------------------- Multi Wound Chart [facility] Patient Name: Adam Merritt Date of Service: 06/28/2020 2:30 PM Medical Record Number: [id number] Patient Account Number: [id number] Date of Birth/Sex: 1945-10-20 (74 y.o. M) Treating RN: Carlene Coria Primary Care  Williamsen: [name] Other Clinician: Referring Jaedan Schuman: [name] Treating Marchel Foote/Extender: Skipper Cliche in Treatment: 5 Vital Signs Height(in): 64 Pulse(bpm): 49 Weight(lbs): 288 Blood Pressure(mmHg): 143/84 Body Mass Index(BMI): 46 Temperature(F): 98.4 Respiratory Rate(breaths/min): 20 Photos: [39:No Photos] Wound Location: Left, Medial Lower Leg Left, Posterior Lower Leg Right, Lateral Lower Leg Wounding Event: Gradually Appeared Gradually Appeared Skin Tear/Laceration Primary Etiology: Lymphedema Lymphedema Lymphedema Secondary Etiology: Diabetic Wound/Ulcer of the Lower Diabetic Wound/Ulcer of the Lower N/A Extremity Extremity Comorbid History: Anemia, Lymphedema, Chronic Anemia, Lymphedema, Chronic Anemia, Lymphedema, Chronic Obstructive Pulmonary Disease Obstructive Pulmonary Disease Obstructive Pulmonary Disease (COPD), Sleep Apnea, Congestive (COPD), Sleep Apnea, Congestive (COPD), Sleep Apnea, Congestive Heart Failure, Coronary Artery Heart Failure, Coronary Artery Heart Failure, Coronary Artery Disease, Hypertension, Peripheral Disease, Hypertension, Peripheral Disease, Hypertension, Peripheral Venous Disease, Type II Diabetes, Venous Disease, Type II Diabetes, Venous Disease, Type II Diabetes, Osteoarthritis, Neuropathy, Osteoarthritis, Neuropathy, Osteoarthritis, Neuropathy, Received Chemotherapy Received Chemotherapy Received Chemotherapy Date Acquired: 05/03/2020 05/03/2020 06/26/2020 Weeks of Treatment: 5 5 0 Wound Status: Open Open Open Measurements L x W x D (cm) 0.1x0.1x0.1 0.1x0.1x0.1 5.5x7.2x0.1 Area (cm) : 0.008 0.008 31.102 Volume (cm) : 0.001 0.001 3.11 % Reduction in Area: 95.90% 99.90% N/A % Reduction in Volume: 95.00% 99.90% N/A Classification: Full Thickness Without Exposed Full Thickness Without Exposed Full Thickness Without Exposed Support Structures Support Structures Support Structures Exudate Amount: None Present N/A Large Exudate Type: N/A N/A Serosanguineous Exudate Color: N/A N/A red, brown Granulation Amount: None Present (0%) None  Present (0%) Large (67-100%) Granulation Quality: N/A N/A Red, Pink Necrotic Amount: None Present (0%) None Present (0%) None Present (0%) Exposed Structures: Fascia: No Fascia: No Fat Layer (Subcutaneous Tissue): Fat Layer (Subcutaneous Tissue): Fat Layer (Subcutaneous Tissue): Yes No No Fascia: No Tendon: No Tendon: No Tendon: No Muscle: No Muscle: No Muscle: No Joint: No Joint: No Joint: No Bone: No Bone: No Bone: No Epithelialization: Large (67-100%) Large (67-100%) None Wound Number: 40 N/A N/A Photos: No Photos N/A N/A Wound Location: Left, Lateral Lower Leg N/A N/A Wounding Event: Gradually Appeared N/A N/A Primary Etiology: Lymphedema N/A N/A Adam Merritt, Adam Merritt ([id number]) Secondary Etiology: N/A N/A N/A Comorbid History: Anemia, Lymphedema, Chronic N/A N/A Obstructive Pulmonary Disease (COPD), Sleep Apnea, Congestive Heart Failure, Coronary Artery Disease, Hypertension, Peripheral Venous Disease, Type II Diabetes, Osteoarthritis, Neuropathy, Received Chemotherapy Date Acquired: 06/28/2020 N/A N/A Weeks of Treatment: 0 N/A N/A Wound Status: Open N/A N/A Measurements L x W x D (cm) 7.5x9.5x0.1 N/A N/A Area (cm) : 55.96 N/A N/A Volume (cm) : 5.596 N/A N/A % Reduction in Area: N/A N/A N/A % Reduction in Volume: N/A N/A N/A Classification: Full Thickness Without Exposed N/A N/A Support Structures Exudate Amount: Large N/A N/A Exudate Type: Serous N/A N/A Exudate Color: amber N/A N/A Granulation Amount: Large (67-100%) N/A N/A Granulation Quality: Pink, Pale N/A N/A Necrotic Amount: None Present (0%) N/A N/A Exposed Structures: Fat Layer (Subcutaneous Tissue): N/A N/A Yes Fascia: No Tendon: No Muscle: No Joint:  No Bone: No Epithelialization: None N/A N/A Treatment Notes Electronic Signature(s) Signed: 07/01/2020 11:33:31 AM By: Carlene Coria RN Entered By: Carlene Coria on 06/28/2020 15:21:41 Adam Merritt  ([id number]) -------------------------------------------------------------------------------- Adam Merritt Patient Name: Adam Merritt Date of Service: 06/28/2020 2:30 PM Medical Record Number: [id number] Patient Account Number: [id number] Date of Birth/Sex: 1946-04-30 (75 y.o. M) Treating RN: Carlene Coria Primary Care Chancie Lampert: [name] Other Clinician: Referring Shanieka Blea: [name] Treating Lanyla Costello/Extender: Skipper Cliche in Treatment: 5 Active Inactive Wound/Skin Impairment Nursing Diagnoses: Impaired tissue integrity Goals: Patient/caregiver will verbalize understanding of skin care regimen Date Initiated: 05/20/2020 Target Resolution Date: 07/21/2020 Goal Status: Active Interventions: Assess ulceration(s) every visit Treatment Activities: Referred to DME Mushka Laconte for dressing supplies : 05/20/2020 Notes: Electronic Signature(s) Signed: 07/01/2020 11:33:31 AM By: Carlene Coria RN Entered By: Carlene Coria on 06/28/2020 15:21:05 Adam Merritt ([id number]) -------------------------------------------------------------------------------- Pain Assessment [facility] Patient Name: Adam Merritt Date of Service: 06/28/2020 2:30 PM Medical Record Number: [id number] Patient Account Number: [id number] Date of Birth/Sex: [birth year] (74 y.o. M) Treating RN: Dolan Amen Primary Care Gus Littler: [name] Other Clinician: Referring Gene Colee: [name] Treating Raquell Richer/Extender: Skipper Cliche in Treatment: 5 Active Problems Location of Pain Severity and Description of Pain Patient Has Paino No Site Locations Rate the pain. Current Pain Level: 0 Pain Management and Medication Current Pain Management: Electronic Signature(s) Signed: 06/28/2020 4:33:18 PM By: Georges Mouse, [name] Breeding RN Entered By: Georges Mouse, [name] Breeding on 06/28/2020 15:00:01 Adam Merritt  ([id number]) -------------------------------------------------------------------------------- Patient/Caregiver Education [facility] Patient Name: Adam Merritt Date of Service: 06/28/2020 2:30 PM Medical Record Number: [id number] Patient Account Number: [id number] Date of Birth/Gender: 19-May-1946 (74 y.o. M) Treating RN: Carlene Coria Primary Care Physician: [name] Other Clinician: Referring Physician: [name] Treating Physician/Extender: Skipper Cliche in Treatment: 5 Education Assessment Education Provided To: Patient Education Topics Provided Wound/Skin Impairment: Methods: Explain/Verbal Responses: State content correctly Electronic Signature(s) Signed: 07/01/2020 11:33:31 AM By: Carlene Coria RN Entered By: Carlene Coria on 06/28/2020 15:32:02 Adam Merritt ([id number]) -------------------------------------------------------------------------------- Wound Assessment [facility] Patient Name: Adam Merritt Date of Service: 06/28/2020 2:30 PM Medical Record Number: [id number] Patient Account Number: [id number] Date of Birth/Sex: 1946-04-08 (75 y.o. M) Treating RN: Dolan Amen Primary Care Roper Tolson: [name] Other Clinician: Referring Koreen Lizaola: [name] Treating Malorie Bigford/Extender: Jeri Cos [name]: 5 Wound Status Wound Number: 37 Primary Lymphedema Etiology: Wound Location: Left, Medial Lower Leg Secondary Diabetic Wound/Ulcer of the Lower Extremity Wounding Event: Gradually Appeared Etiology: Date Acquired: 05/03/2020 Wound Open Weeks Of Treatment: 5 Status: Clustered Wound: No Comorbid Anemia, Lymphedema, Chronic Obstructive Pulmonary History: Disease (COPD), Sleep Apnea, Congestive Heart Failure, Coronary Artery Disease, Hypertension, Peripheral Venous Disease, Type II Diabetes, Osteoarthritis, Neuropathy, Received Chemotherapy Photos Wound Measurements Length: (cm) 0.1 Width: (cm) 0.1 Depth: (cm) 0.1 Area: (cm) 0.008 Volume: (cm)  0.001 % Reduction in Area: 95.9% % Reduction in Volume: 95% Epithelialization: Large (67-100%) Tunneling: No Undermining: No Wound Description Classification: Full Thickness Without Exposed Support Structure Exudate Amount: None Present s Foul Odor After Cleansing: No Slough/Fibrino No Wound Bed Granulation Amount: None Present (0%) Exposed Structure Necrotic Amount: None Present (0%) Fascia Exposed: No Fat Layer (Subcutaneous Tissue) Exposed: No Tendon Exposed: No Muscle Exposed: No Joint Exposed: No Bone Exposed: No Treatment Notes Wound #37 (Lower Leg) Wound Laterality: Left, Medial Cleanser Peri-Wound Care PILOT, PRINDLE ([id number]) Topical Primary Dressing Secondary Dressing Secured With Compression Wrap Profore LF 4 Multi-Layer Compression Bandaging System Discharge Instruction: Apply 4 multi-layer wrap  as directed. Compression Stockings Add-Ons Electronic Signature(s) Signed: 06/28/2020 4:33:18 PM By: Georges Mouse, [name] Breeding RN Entered By: Georges Mouse, [name] Breeding on 06/28/2020 15:11:35 Adam Merritt ([id number]) -------------------------------------------------------------------------------- Wound Assessment [facility] Patient Name: Adam Merritt Date of Service: 06/28/2020 2:30 PM Medical Record Number: [id number] Patient Account Number: [id number] Date of Birth/Sex: Nov 28, 1945 (74 y.o. M) Treating RN: Dolan Amen Primary Care Dagan Heinz: [name] Other Clinician: Referring Shaunette Gassner: [name] Treating Belladonna Lubinski/Extender: Jeri Cos [name]: 5 Wound Status Wound Number: 38 Primary Lymphedema Etiology: Wound Location: Left, Posterior Lower Leg Secondary Diabetic Wound/Ulcer of the Lower Extremity Wounding Event: Gradually Appeared Etiology: Date Acquired: 05/03/2020 Wound Open Weeks Of Treatment: 5 Status: Clustered Wound: No Comorbid Anemia, Lymphedema, Chronic Obstructive Pulmonary History: Disease (COPD), Sleep Apnea, Congestive  Heart Failure, Coronary Artery Disease, Hypertension, Peripheral Venous Disease, Type II Diabetes, Osteoarthritis, Neuropathy, Received Chemotherapy Photos Wound Measurements Length: (cm) 0.1 Width: (cm) 0.1 Depth: (cm) 0.1 Area: (cm) 0.008 Volume: (cm) 0.001 % Reduction in Area: 99.9% % Reduction in Volume: 99.9% Epithelialization: Large (67-100%) Tunneling: No Undermining: No Wound Description Classification: Full Thickness Without Exposed Support Structure s Foul Odor After Cleansing: No Slough/Fibrino No Wound Bed Granulation Amount: None Present (0%) Exposed Structure Necrotic Amount: None Present (0%) Fascia Exposed: No Fat Layer (Subcutaneous Tissue) Exposed: No Tendon Exposed: No Muscle Exposed: No Joint Exposed: No Bone Exposed: No Treatment Notes Wound #38 (Lower Leg) Wound Laterality: Left, Posterior Cleanser Peri-Wound Care Adam Merritt, Adam Merritt ([id number]) Topical Primary Dressing Secondary Dressing Secured With Compression Wrap Profore LF 4 Multi-Layer Compression Bandaging System Discharge Instruction: Apply 4 multi-layer wrap as directed. Compression Stockings Add-Ons Electronic Signature(s) Signed: 06/28/2020 4:33:18 PM By: Georges Mouse, [name] Breeding RN Entered By: Georges Mouse, [name] Breeding on 06/28/2020 15:12:16 Adam Merritt ([id number]) -------------------------------------------------------------------------------- Wound Assessment [facility] Patient Name: Adam Merritt Date of Service: 06/28/2020 2:30 PM Medical Record Number: [id number] Patient Account Number: [id number] Date of Birth/Sex: 07-19-1945 (74 y.o. M) Treating RN: Dolan Amen Primary Care Melodi Happel: [name] Other Clinician: Referring Halston Fairclough: [name] Treating Leydy Worthey/Extender: Jeri Cos [name]: 5 Wound Status Wound Number: 39 Primary Lymphedema Etiology: Wound Location: Right, Lateral Lower Leg Wound Open Wounding Event: Skin Tear/Laceration Status: Date  Acquired: 06/26/2020 Comorbid Anemia, Lymphedema, Chronic Obstructive Pulmonary Weeks Of Treatment: 0 History: Disease (COPD), Sleep Apnea, Congestive Heart Failure, Clustered Wound: No Coronary Artery Disease, Hypertension, Peripheral Venous Disease, Type II Diabetes, Osteoarthritis, Neuropathy, Received Chemotherapy Wound Measurements Length: (cm) 5.5 Width: (cm) 7.2 Depth: (cm) 0.1 Area: (cm) 31.102 Volume: (cm) 3.11 % Reduction in Area: % Reduction in Volume: Epithelialization: None Tunneling: No Undermining: No Wound Description Classification: Full Thickness Without Exposed Support Structures Exudate Amount: Large Exudate Type: Serosanguineous Exudate Color: red, brown Foul Odor After Cleansing: No Slough/Fibrino No Wound Bed Granulation Amount: Large (67-100%) Exposed Structure Granulation Quality: Red, Pink Fascia Exposed: No Necrotic Amount: None Present (0%) Fat Layer (Subcutaneous Tissue) Exposed: Yes Tendon Exposed: No Muscle Exposed: No Joint Exposed: No Bone Exposed: No Treatment Notes Wound #39 (Lower Leg) Wound Laterality: Right, Lateral Cleanser Normal Saline Discharge Instruction: Wash your hands with soap and water. Remove old dressing, discard into plastic bag and place into trash. Cleanse the wound with Normal Saline prior to applying a clean dressing using gauze sponges, not tissues or cotton balls. Do not scrub or use excessive force. Pat dry using gauze sponges, not tissue or cotton balls. Peri-Wound Care Topical Primary Dressing Silvercel 4 1/4x 4 1/4 (in/in) Discharge Instruction: Apply Silvercel 4 1/4x 4 1/4 (  in/in) as instructed Secondary Dressing ABD Pad 5x9 (in/in) Discharge Instruction: Cover with ABD pad Secured With Adam Merritt, Adam Merritt. ([id number]) Compression Wrap Profore LF 4 Multi-Layer Compression Bandaging System Discharge Instruction: Apply 4 multi-layer wrap as directed. Compression Stockings Add-Ons Electronic  Signature(s) Signed: 06/28/2020 4:33:18 PM By: Georges Mouse, [name] Breeding RN Entered By: Georges Mouse, [name] Breeding on 06/28/2020 15:08:06 Adam Merritt ([id number]) -------------------------------------------------------------------------------- Wound Assessment [facility] Patient Name: Adam Merritt Date of Service: 06/28/2020 2:30 PM Medical Record Number: [id number] Patient Account Number: [id number] Date of Birth/Sex: May 18, 1946 (74 y.o. M) Treating RN: Dolan Amen Primary Care Kordell Jafri: [name] Other Clinician: Referring Karina Lenderman: [name] Treating Mercer Stallworth/Extender: Jeri Cos [name]: 5 Wound Status Wound Number: 40 Primary Lymphedema Etiology: Wound Location: Left, Lateral Lower Leg Wound Open Wounding Event: Gradually Appeared Status: Date Acquired: 06/28/2020 Comorbid Anemia, Lymphedema, Chronic Obstructive Pulmonary Weeks Of Treatment: 0 History: Disease (COPD), Sleep Apnea, Congestive Heart Failure, Clustered Wound: No Coronary Artery Disease, Hypertension, Peripheral Venous Disease, Type II Diabetes, Osteoarthritis, Neuropathy, Received Chemotherapy Wound Measurements Length: (cm) 7.5 Width: (cm) 9.5 Depth: (cm) 0.1 Area: (cm) 55.96 Volume: (cm) 5.596 % Reduction in Area: % Reduction in Volume: Epithelialization: None Tunneling: No Undermining: No Wound Description Classification: Full Thickness Without Exposed Support Structu Exudate Amount: Large Exudate Type: Serous Exudate Color: amber res Wound Bed Granulation Amount: Large (67-100%) Exposed Structure Granulation Quality: Pink, Pale Fascia Exposed: No Necrotic Amount: None Present (0%) Fat Layer (Subcutaneous Tissue) Exposed: Yes Tendon Exposed: No Muscle Exposed: No Joint Exposed: No Bone Exposed: No Treatment Notes Wound #40 (Lower Leg) Wound Laterality: Left, Lateral Cleanser Peri-Wound Care Topical Primary Dressing Secondary Dressing Secured With Compression  Wrap Profore LF 4 Multi-Layer Compression Bandaging System Discharge Instruction: Apply 4 multi-layer wrap as directed. Compression Stockings Add-Ons Electronic Signature(s) Adam Merritt, Adam Merritt ([id number]) Signed: 06/28/2020 4:33:18 PM By: Georges Mouse, [name] Breeding RN Entered By: Georges Mouse, [name] Breeding on 06/28/2020 15:14:47 Adam Merritt ([id number]) -------------------------------------------------------------------------------- Vitals Merritt Patient Name: Adam Merritt Date of Service: 06/28/2020 2:30 PM Medical Record Number: [id number] Patient Account Number: [id number] Date of Birth/Sex: [birth year] (74 y.o. M) Treating RN: Cornell Barman Primary Care Dontaye Hur: [name] Other Clinician: Referring Eligha Kmetz: [name] Treating Tyrell Brereton/Extender: Skipper Cliche in Treatment: 5 Vital Signs Time Taken: 14:50 Temperature (F): 98.4 Height (in): 66 Pulse (bpm): 65 Weight (lbs): 288 Respiratory Rate (breaths/min): 20 Body Mass Index (BMI): 46.5 Blood Pressure (mmHg): 143/84 Reference Range: 80 - 120 mg / dl Electronic Signature(s) Signed: 06/28/2020 4:33:55 PM By: Lorine Bears RCP, RRT, CHT Entered By: Lorine Bears on 06/28/2020 14:53:52

## 2020-07-02 ENCOUNTER — Ambulatory Visit: Payer: Medicare PPO

## 2020-07-05 ENCOUNTER — Encounter: Payer: Medicare PPO | Admitting: Physician Assistant

## 2020-07-05 ENCOUNTER — Other Ambulatory Visit: Payer: Self-pay

## 2020-07-05 DIAGNOSIS — I110 Hypertensive heart disease with heart failure: Secondary | ICD-10-CM | POA: Diagnosis not present

## 2020-07-05 DIAGNOSIS — I872 Venous insufficiency (chronic) (peripheral): Secondary | ICD-10-CM | POA: Diagnosis not present

## 2020-07-05 DIAGNOSIS — I509 Heart failure, unspecified: Secondary | ICD-10-CM | POA: Diagnosis not present

## 2020-07-05 DIAGNOSIS — I890 Lymphedema, not elsewhere classified: Secondary | ICD-10-CM | POA: Diagnosis not present

## 2020-07-05 DIAGNOSIS — E1140 Type 2 diabetes mellitus with diabetic neuropathy, unspecified: Secondary | ICD-10-CM | POA: Diagnosis not present

## 2020-07-05 DIAGNOSIS — L97822 Non-pressure chronic ulcer of other part of left lower leg with fat layer exposed: Secondary | ICD-10-CM | POA: Diagnosis not present

## 2020-07-05 DIAGNOSIS — I2510 Atherosclerotic heart disease of native coronary artery without angina pectoris: Secondary | ICD-10-CM | POA: Diagnosis not present

## 2020-07-05 DIAGNOSIS — E11622 Type 2 diabetes mellitus with other skin ulcer: Secondary | ICD-10-CM | POA: Diagnosis not present

## 2020-07-05 NOTE — Progress Notes (Addendum)
Adam Merritt, Adam Merritt ([id number]) Visit Report for 07/05/2020 Chief Complaint Document Details Patient Name: Adam Merritt, Adam Merritt Date of Service: 07/05/2020 1:30 PM Medical Record Number: [id number] Patient Account Number: [id number] Date of Birth/Sex: 07-25-1945 (75 y.o. M) Treating RN: Carlene Coria Primary Care Provider: [name] Other Clinician: Referring Provider: [name] Treating Provider/Extender: Skipper Cliche in Treatment: 6 Information Obtained from: Patient Chief Complaint Left leg ulcers Electronic Signature(s) Signed: 07/05/2020 1:24:01 PM By: Worthy Keeler PA-C Entered By: Worthy Keeler on 07/05/2020 13:24:00 Adam Merritt ([id number]) -------------------------------------------------------------------------------- HPI Details Patient Name: Adam Merritt Date of Service: 07/05/2020 1:30 PM Medical Record Number: [id number] Patient Account Number: [id number] Date of Birth/Sex: 1945-11-22 (74 y.o. M) Treating RN: Carlene Coria Primary Care Provider: [name] Other Clinician: Referring Provider: [name] Treating Provider/Extender: Skipper Cliche in Treatment: 6 History of Present Illness HPI Description: 10/18/17-He is here for initial evaluation of bilateral lower extremity ulcerations in the presence of venous insufficiency and lymphedema. He has been seen by vascular medicine in the past, Dr. Lucky Cowboy, last seen in 2016. He does have a history of abnormal ABIs, which is to be expected given his lymphedema and venous insufficiency. According to Epic, it appears that all attempts for arterial evaluation and/or angiography were not follow through with by patient. He does have a history of being seen in lymphedema clinic in 2018, stopped going approximately 6 months ago stating "it didn't do any good". He does not have lymphedema pumps, he does not have custom fit compression wrap/stockings. He is diabetic and his recent A1c last month was 7.6. He admits to chronic  bilateral lower extremity pain, no change in pain since blister and ulceration development. He is currently being treated with Levaquin for bronchitis. He has home health and we will continue. 10/25/17-He is here in follow-up evaluation for bilateral lower extremity ulcerationssubtle he remains on Levaquin for bronchitis. Right lower extremity with no evidence of drainage or ulceration, persistent left lower extremity ulceration. He states that home health has not been out since his appointment. He went to [facility] on Tuesday, studies revealed: RIGHT ABI 0.9, TBI 0.6 LEFT ABI 1.1, TBI 0.6 with triphasic flow bilaterally. We will continue his same treatment plan. He has been educated on compression therapy and need for elevation. He will benefit from lymphedema pumps 11/01/17-He is here in follow-up evaluation for left lower extremity ulcer. The right lower extremity remains healed. He has home health services, but they have not been out to see the patient for 2-3 weeks. He states it home health physical therapy changed his dressing yesterday after therapy; he placed Ace wrap compression. We are still waiting for lymphedema pumps, reordered d/t need for company change. 11/08/17-He is here in follow-up evaluation for left lower extremity ulcer. It is improved. Edema is significantly improved with compression therapy. We will continue with same treatment plan and he will follow-up next week. No word regarding lymphedema pumps 11/15/17-He is here in follow-up evaluation for left lower extremity ulcer. He is healed and will be discharged from wound care services. I have reached out to [facility] regarding his lymphedema pumps. They have been unable to reach the patient; the contact number they had with the patient's wife's cell phone and she has not answered any unrecognized calls. Contact should be made today, trial planned for next week; [facility] will continue to  follow 11/27/17 on evaluation today patient has multiple blistered areas over the right lower extremity his  left lower extremity appears to be doing okay. These blistered areas show signs of no infection which is great news. With that being said he did have some necrotic skin overlying which was mechanically debrided away with saline and gauze today without complication. Overall post debridement the wounds appear to be doing better but in general his swelling seems to be increased. This is obviously not good news. I think this is what has given rise to the blisters. 12/04/17 on evaluation today patient presents for follow-up concerning his bilateral lower extremity edema in the right lower extremity ulcers. He has been tolerating the dressing changes without complication. With that being said he has had no real issues with the wraps which is also good news. Overall I'm pleased with the progress he's been making. 12/11/17 on evaluation today patient appears to be doing rather well in regard to his right lateral lower extremity ulcer. He's been tolerating the dressing changes without complication. Fortunately there does not appear to be any evidence of infection at this time. Overall I'm pleased with the progress that is being made. Unfortunately he has been in the hospital due to having what sounds to be a stomach virus/flu fortunately that is starting to get better. 12/18/17 on evaluation today patient actually appears to be doing very well in regard to his bilateral lower extremities the swelling is under fairly good control his lymphedema pumps are still not up and running quite yet. With that being said he does have several areas of opening noted as far as wounds are concerned mainly over the left lower extremity. With that being said I do believe once he gets lymphedema pumps this would least help you mention some the fluid and preventing this from occurring. Hopefully that will be set up soon sleeves  are Artie in place at his home he just waiting for the machine. 12/25/17 on evaluation today patient actually appears to be doing excellent in fact all of his ulcers appear to have resolved his legs appear very well. I do think he needs compression stockings we have discussed this and they are actually going to go to [place] today to elastic therapy to get this fitted for him. I think that is definitely a good thing to do. Readmission: 04/09/18 upon evaluation today this patient is seen for readmission due to bilateral lower extremity lymphedema. He has significant swelling of his extremities especially on the left although the right is also swollen he has weeping from both sides. There are no obvious open wounds at this point. Fortunately he has been doing fairly well for quite a bit of time since I last saw him. Nonetheless unfortunately this seems to have reopened and is giving quite a bit of trouble. He states this began about a week ago when he first called [place] to get in to be seen. No fevers, chills, nausea, or vomiting noted at this time. He has not been using his lymphedema pumps due to the fact that they won't fit on his leg at this point likewise is also not been using his compression for essentially the same reason. 04/16/18 upon evaluation today patient actually appears to be doing a little better in regard to the fluid in his bilateral lower extremities. With that being said he's had three falls since I saw him last week. He also states that he's been feeling very poorly. I was concerned last week and feel like that the concern is still there as far as the congestion in his chest  is concerned he seems to be breathing about the same as last week but again he states he's very weak he's not even able to walk further than from the chair to the door. His wife had to buy a wheelchair just to be able to get them out of the house to get to the appointment today. This has me very  concerned. 04/23/18 on evaluation today patient actually appears to be doing much better than last week's evaluation. At that time actually had to transport him to the ER via EMS and he subsequently was admitted for acute pulmonary edema, acute renal failure, and acute congestive heart failure. Fortunately he is doing much better. Apparently they did dialyze him and were able to take off roughly 35 pounds of fluid. Nonetheless he is feeling much better both in regard to his breathing and he's able to get around much better at this time compared to previous. Overall I'm very Adam Merritt, Adam Merritt ([id number]) happy with how things are at this time. There does not appear to be any evidence of infection currently. No fevers, chills, nausea, or vomiting noted at this time. 04/30/2018 patient seen today for follow-up and management of bilateral lower extremity lymphedema. He did express being more sad today than usual due to the recent loss of his dog. He states that he has been compliant with using the lymphedema pumps. However he does admit a minute over the last 2-3 days he has not been using the pumps due to the recent loss of his dog. At this time there is no drainage or open wounds to his lower extremities. The left leg edema is measuring smaller today. Still has a significant amount of edema on bilateral lower extremities With dry flaky skin. He will be referred to the lymphedema clinic for further management. Will continue 3 layer compression wraps and follow-up in 1-2 weeks.Denies any pain, fever, chairs recently. No recent falls or injuries reported during this visit. 05/07/18 on evaluation today patient actually appears to be doing very well in regard to his lower extremities in general all things considered. With that being said he is having some pain in the legs just due to the amount of swelling. He does have an area where he had a blister on the left lateral lower extremity this is open at this  point other than that there's nothing else weeping at this time. 05/14/18 on evaluation today patient actually appears to be doing excellent all things considered in regard to his lower extremities. He still has a couple areas of weeping on each leg which has continued to be the issue for him. He does have an appointment with the lymphedema clinic although this isn't until February 2020. That was the earliest they had. In the meantime he has continued to tolerate the compression wraps without complication. 05/28/18 on evaluation today patient actually appears to be doing more poorly in regard to his left lower extremity where he has a wound open at this point. He also had a fall where he subsequently injured his right great toe which has led to an open wound at the site unfortunately. He has been tolerating the dressing changes without complication in general as far as the wraps are concerned that he has not been putting any dressing on the left 1st toe ulcer site. 06/11/18 on evaluation today patient appears to be doing much worse in regard to his bilateral lower extremity ulcers. He has been tolerating the dressing changes without complication although his legs  have not been wrapped more recently. Overall I am not very pleased with the way his legs appear. I do believe he needs to be back in compression wraps he still has not received his compression wraps from the [facility] hospital as of yet. 06/18/18 on evaluation today patient actually appears to be doing significantly better than last time I saw him. He has been tolerating the compression wraps without complication in the circumferential ulcers especially appear to be doing much better. His toe ulcer on the right in regard to the great toe is better although not as good as the legs in my pinion. No fevers chills noted 07/02/18 on evaluation today patient appears to be doing much better in regard to his lower extremity ulcers. Unfortunately since I last  saw him he's had the distal portion of his right great toe if he dated it sounds as if this actually went downhill very quickly. I had only seen him a few days prior and the toe did not appear to be infected at that point subsequently became infected very rapidly and it was decided by the surgeon that the distal portion of the toe needed to be removed. The patient seems to be doing well in this regard he tells me. With that being said his lower extremities are doing better from the standpoint of the wounds although he is significantly swollen at this point. 07/09/18 on evaluation today patient appears to be doing better in regard to the wounds on his lower extremities. In fact everything is almost completely healed he is just a small area on the left posterior lower extremity that is open at this point. 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 With that being said he needs some kind of compression it does sound like he may not be wearing his compression, that is the wraps, during the entire time between when he's here visit to visit. Apparently his wife took the current one off because it began to "fall apart". 07/16/18 on evaluation today patient appears to be extremely swollen especially in regard to his left lower extremity unfortunately. He also has a new skin tear over the left lower extremity and there's a smaller area on the right lower extremity as well. Unfortunately this seems to be due in part to blistering and fluid buildup in his leg. He did get the reduction wraps that were ordered by the [facility] hospital for him to go to lymphedema clinic. With that being said his wounds on the legs have not healed to the point to where they would likely accept them as a patient lymphedema clinic currently. We need to try to  get this to heal. With that being said he's been taking his wraps off which is not doing him any favors at this point. In fact this is probably quite counterproductive compared to what needs to occur. We will likely need to increase to a four layer compression wrap and continue to also utilize elevation and he has to keep the wraps on not take them off as he's been doing currently hasn't had a wrap on since Saturday. 07/23/18 on evaluation today patient appears to be doing much better in regard to his bilateral lower extremities. In fact his left lower extremity which was the largest is actually 15 cm smaller today compared to what it  was last time he was here in our clinic. This is obviously good news after just one week. Nonetheless the differences he actually kept the wraps on during the entire week this time. That's not typical for him. I do believe he understands a little bit better now the severity of the situation and why it's important for him to keep these wraps on. 07/30/18 on evaluation today patient actually appears to be doing rather well in regard to his lower extremities. His legs are much smaller than they have been in the past and he actually has only one very small rudder superficial region remaining that is not closed on the left lateral/posterior lower extremity even this is almost completely close. I do believe likely next week he will be healed without any complications. I do think we need to continue the wraps however this seems to be beneficial for him. I also think it may be a good time for [place] to go ahead and see about getting the appointment with the lymphedema clinic which is supposed to be made for him in order to keep this moving along and hopefully get them into compression wraps that will in the end help him to remain healed. 08/06/18 on evaluation today patient actually appears to be doing very well in regard as bilateral lower extremities. In fact his wounds appear to  be completely healed at this time. 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 He is in the process of getting appointment with the lymphedema clinic we have made this referral were just waiting to hear back on the schedule time. We need to follow up on that today as well. 08/13/18 on evaluation today patient actually appears to be doing very well in regard to his bilateral lower extremities there are no open wounds at this point. We are gonna go ahead and see about ordering the Velcro compression wraps for him had a discussion with them about [place] doing it versus the [place] they feel like they can definitely afford going ahead and get the wraps themselves and they would prefer to try to avoid having to go to the lymphedema clinic if it all possible which I completely understand. As long as he has good compression I'm okay either way. 3/17//20 on evaluation today patient actually appears to be doing well in regard to his bilateral lower extremity ulcers. He has been tolerating the dressing changes without complication specifically the compression wraps. Overall is had no issues my fingers finding that I see at this point is that he is having trouble with constipation. He tells me he has not been able to go to the bathroom for about six days. He's taken to over-the- counter oral laxatives unfortunately this is not helping. He has not contacted his doctor. Adam Merritt, Adam Merritt ([id number]) 08/27/18 on evaluation today patient appears to be doing fairly well in regard to his lower extremities at this point. There does not appear to be any new altars is swelling is very well controlled. We are still waiting for his Velcro compression wraps to arrive that should be sometime in the next week is Artie sent the check for these. 09/03/18 on evaluation today patient appears to be doing excellent in regard to his bilateral lower extremities which he shows no signs of  wound openings in regard to this point. He does have his Velcro compression wraps which did arrive in the mail since I saw him last week. Overall he is  doing excellent in my pinion. 09/13/18 on evaluation today patient appears to be doing very well currently in regard to the overall appearance of his bilateral lower extremities although he's a little bit more swollen than last time we saw him. At that point he been discharged without any open wounds. Nonetheless he has a small open wound on the posterior left lower extremity with some evidence of cellulitis noted as well. Fortunately I feel like he has made good progress overall with regard to his lower extremities from were things used to be. 09/20/18 on evaluation today patient actually appears to be doing much better. The erythematous lower extremity is improving wound itself which is still open appears to be doing much better as far as but appearance as well as pain is concerned overall very pleased in this regard. There's no signs of active infection at this time. 09/27/18 on evaluation today patient's wounds on the lower extremity actually appear to be doing fairly well at this time which is good news. There is no evidence of active infection currently and again is just as left lower extremity were there any wounds at this point anyway. I believe they may be completely healed but again I'm not 100% sure based on evaluation today. I think one more week of observation would likely be a good idea. 10/04/18 on evaluation today patient actually appears to be doing excellent in regard to the left lower extremity which actually appears to be completely healed as of today. Unfortunately he's been having issues with his right lower extremity have a new wound that has opened. Fortunately there's no evidence of active infection at this time which is good news. 10/10/18 upon evaluation today patient actually appears to be doing a little bit worse with the new  open area on his right posterior lower extremity. He's been tolerating the dressing changes without complication. Right now we been using his compression wraps although I think we may need to switch back to actually performing bilateral compression wraps are in the clinic. No fevers, chills, nausea, or vomiting noted at this time. 10/17/18 on evaluation today patient actually appears to be doing quite well in regard to his bilateral lower extremity ulcers. He has been tolerating the reps without complication although he would prefer not to rewrap his legs as of today. Fortunately there's no signs of active infection which is good news. No fevers, chills, nausea, or vomiting noted at this time. 10/24/18 on evaluation today patient appears to be doing very well in regard to his lower extremities. His right lower extremity is shown signs of healing and his left lower Trinity though not healed appears to be improving which is excellent news. Overall very pleased with how things seem to be progressing at this time. The patient is likewise happy to hear this. His Velcro compression wraps however have not been put on properly will gonna show his wife how to do that properly today. 10/31/18 on evaluation today patient appears to be doing more poorly in regard to his left lower extremity in particular although both lower extremities actually are showing some signs of being worse than my previous evaluation. Unfortunately I'm just not sure that his compression stockings even with the use of the compression/lymphedema pumps seem to be controlling this well. Upon further questioning he tells me that he also is not able to lie flat in the bed due to his congestive heart failure and difficulty breathing. For that reason he sleeps in his recliner. To make  matters worse his recliner also cannot even hold his legs up so instead of even being somewhat elevated they pretty much hang down to the floor. This is the way he  sleeps each night which is definitely counterproductive to everything else that were attempting to do from the standpoint of controlling his fluid. Nonetheless I think that he potentially could benefit from a hospital bed although this would be something that his primary care provider would likely have to order since anything that is order on our side has to be directly related to wound care and again the hospital bed is not necessarily a direct relation to although I think it does contribute to his overall wound status on his lower extremities. 11/07/18 on evaluation today patient appears to be doing better in regard to his bilateral lower extremity. He's been tolerating the dressing changes without complication. We did in the interim since I last saw him switch to just using extras orbiting new alginate and that seems to have done much better for him. I'm very pleased with the overall progress that is made. 11/14/18 on evaluation today patient appears to be redoing rather well in regard to his left lower extremity ulcers which are the only ones remaining at this point. Fortunately there's no signs of active infection at this time which is good news. Overall been very pleased with how things seem to be progressing currently. No fevers, chills, nausea, or vomiting noted at this time. 11/20/17 on evaluation today patient actually appears to be doing quite well in regard to his lower extremities on the left on the right he has several blisters that showed up although there's some question about whether or not he has had his Adam Merritt compression wraps on like he was supposed to or not. Fortunately there's no signs of active infection at this time which is good news. Unfortunately though he is doing well from the standpoint of the left leg the right leg is not doing as well again this is when we did not wrap last week. 11/28/18 on evaluation today patient appears to be doing well in regard to his bilateral lower  extremities is left appears to be healed is right is not healed but is very close to being so. Overall very pleased with how things seem to be progressing. Patient is likewise happy that things are doing well. 12/09/18 on evaluation today patient actually appears to be completely healed which is excellent news. He actually seems to be doing well in regard to the swelling of the bilateral lower extremities which is also great news. Overall very pleased with how things seem to be progressing. Readmission: 03/18/2019 upon evaluation today patient presents for reevaluation concerning issues that he is having with his left lower extremity where he does have a wound noted upon inspection today. He also has a wound on the right second toe on the tip where it is apparently been rubbing in his shoe I did look at issues and you can actually see where this has been occurring as well. Fortunately there is no signs of infection with regard to the toe necessarily although it is very swollen compared to normal that does have me somewhat concerned about the possibility of further evaluation for infection/osteomyelitis. I would recommend an x-ray to start with. Otherwise he states that it is been 1-2 weeks that he has had the draining in regard to left lower extremity he does not know how this happened he has been wearing his compression appropriately and  I do see that as well today but nonetheless I do think that he needs to continue to be very cautious with regard to elevation as well. Adam Merritt, Adam Merritt ([id number]) 03/25/2019 on evaluation today patient appears to be doing much better in regard to his lower extremity at this time. That is on the left. He did culture positive for Pseudomonas but the good news is he seems to be doing much better the gentamicin we applied topically seems to have done a great job for him. There is no signs of active infection at this time systemically and locally he is doing much better.  No fevers, chills, nausea, vomiting, or diarrhea. In regard to his right toe this seems to be doing much better there is very little pressure noted at this point I did clean up some of the callus today around the edges of the wound as well as the surface of the wound but to be honest he is progressing quite nicely. 10/30; the area on the left anterior lower tibia area is healed over. His edema control is adequate even though his use of the compression pumps seems very intermittent. He has a juxta lite stocking on the right leg and a think there is 1 for the left leg at home. His wife is putting these on. He has an area on the plantar right second toe which is a hammertoe they are trying to offload this 04/08/2019 on evaluation today patient appears to be doing well in regard to his lower extremities which is good news. We are seeing him today for his toe ulcer which is giving him a little bit more trouble but again still seems to be doing better at this point which is good news. He is going require some sharp debridement in regard to the toe today however. 04/15/2019 on evaluation today patient actually appears to be doing quite well with regard to his toe ulcer. I am very pleased with how things are doing in that regard. With regard to his lower extremity edema in general he tells me he is not been using the lymphedema pumps regularly although he does not use them. I think that he needs to use them more regularly he does have a small blister on the left lower extremity this is not open at this point but obviously it something that can get worse if he does not keep the compression going and the pumps going as well. 04/22/2019 on evaluation today patient appears to be doing well with regard to his toe ulcer. He has been tolerating the dressing changes without complication. This is measuring slightly smaller this week as compared to last week. Fortunately there is no signs of active infection at this  time. 05/06/2019 on evaluation today patient actually appears to be doing excellent. In fact his toe appears to be completely healed which is great news. There is no signs of active infection at this time. Readmission: Patient presents today for follow-up after having been discharged at the beginning of December 2020. He states that in recent weeks things have reopened and has been having more trouble at this point. With that being said fortunately there is no signs of active infection at this time. No fever chills noted. Nonetheless he also does have an issue with one of his toes as well that seems to be somewhat this is on the right foot second toe. 07/25/2019 upon evaluation today patient's lower extremities appear to be doing excellent bilaterally. I really feel like he  is showing signs of great improvement and in fact I think he is close to healing which is great news. There is no signs of active infection at this time. No fevers, chills, nausea, vomiting, or diarrhea. 07/31/2019 upon evaluation today patient appears to be doing excellent and in fact I think may be completely healed in regard to his right lower extremity that were still to monitor this 1 more week before closing it out. The left lower extremity is measuring smaller although not completely closed seems to be doing excellent. 08/07/2019 upon evaluation today patient appears to be doing excellent in regard to his wounds. In fact the right lower extremity is completely healed there is no issues here. The left lower extremity has 1 very small area still remaining fortunately there is no signs of infection and overall I think he is very close to closure of the here as well. 08/14/19 upon evaluation today patient appears to be doing excellent in regard to his lower extremities. In fact he appears to be completely healed as of today. Fortunately there's no signs of active infection at this time. No fevers, chills, nausea, or vomiting noted  at this time. READMISSION 09/26/2019 This is a 75 year old man who is well-known to this clinic having just been discharged on 08/14/2019. He has known lymphedema and chronic venous insufficiency. He has Farrow wrap stockings and also external compression pumps. He does not use the external compression pumps however. He does however apparently fairly faithfully use the stockings. They have not been lotion in his legs. He has developed new wounds on the right anterior as well as the left medial left lateral and left posterior calf. He returns for our review of this Past medical history includes coronary artery disease, hypertension, congestive heart failure, COPD, venous insufficiency with lymphedema, type 2 diabetes. He apparently has compression pumps, Farrow wraps and at 1 point a hospital bed although I believe he sleeps in a recliner 10/03/2019 upon evaluation today patient appears to be doing better with regard to his wounds in general. He has been tolerating the dressing changes without complication. Fortunately there is no signs of active infection. No fevers, chills, nausea, vomiting, or diarrhea. I do believe the compression wraps are helping as they always have in the past. 10/10/2019 upon evaluation today patient appears to be doing excellent at this point. His right lower extremity is measuring much better and in fact is completely healed. His left lower extremity is also measuring better though not healed seems to be doing excellent at this point which is great news. Overall I am very pleased with how things appear currently. 10/27/2019 upon evaluation today patient appears to be doing quite well with regard to his wounds currently. He has been tolerating the dressing changes without complication. Fortunately there is no signs of active infection at this time. In fact he is almost completely healed on the left and has just a very small area open and on the right he is completely  healed. 11/04/2019 upon evaluation today patient appears to be doing quite well with regard to his wounds. In fact he appears to be quite possibly completely healed on the left lower extremity now as well the right lower extremity is still doing well. With that being said unfortunately though this may be healed I think it is a very fragile area and I cannot even confirm there is not a very small opening still remaining. I really think he may benefit from 1 additional weeks wrapping before  discontinuing. 11/10/2019 upon evaluation today patient appears to be doing well in regard to his original wounds in fact everything that we were treating last week is completely healed on the left. Unfortunately he has a new skin tear just above where the wrap slipped down due to a fall he sustained causing this injury. 11/17/2019 on evaluation today patient appears to be doing better with regard to his wound. He has been tolerating the dressing changes without Adam Merritt, Adam Merritt. ([id number]) complication. Fortunately there is no signs of active infection at this time. No fevers, chills, nausea, vomiting, or diarrhea. 11/24/2019 upon evaluation today patient appears to be doing well with regard to his wound. This is making good progress there does not appear to be signs of active infection but overall I do feel like he is headed in the correct direction. Overall he is tolerating the compression wrap as well. 12/02/2019 upon evaluation today patient appears to be doing well with regard to his leg ulcer. In fact this appears to be completely healed and this is the last of the wounds that he had but still the left anterior lower extremity. Fortunately there is no signs of active infection at this time. No fevers, chills, nausea, vomiting, or diarrhea. Readmission: 02/05/2020 on evaluation today patient appears to be doing well with regard to his wound all things considered. He actually tells me that a couple weeks ago he had  trouble with his wraps and therefore took him off for a couple of days in order to give his legs a break. It was during this time that he actually developed increased swelling and edema of his legs and blisters on both legs. That is when he called to make the appointment. Nonetheless since that time he began wearing his compression wraps which are Velcro in the meantime and has done extremely well with this. In fact his leg appears to be under good control as far as edema is concerned today it is nothing like what I am seeing when he has come in for evaluations in the past. They have been using some of the silver alginate at home which has been beneficial for him. Fortunately there is no signs of active infection at this time. No fevers, chills, nausea, vomiting, or diarrhea. 02/19/2020 on evaluation today patient unfortunately does not appear to be doing quite as well as I would like to see. He has no signs of active infection at this time but unfortunately he is not doing well in regard to the overall appearance of his left leg. He has a couple other areas that are weeping now and the leg is much more swollen. I think that he needs to actually have a compression wrap to try to manage this. 02/26/2020 on evaluation today patient appears to be doing well with regard to his left lower extremity ulcer. Fortunately the swelling is down I do believe the pressure wrap seems to be doing quite well. Fortunately there is no signs of active infection at this time. 03/05/2020 upon evaluation today patient appears to be doing excellent in regard to his legs at this point. Fortunately there is no signs of active infection which is great news. Overall he feels like he is completely healed which is great news. Readmission: 05/20/2020 upon evaluation today patient appears to be doing well with regard to his leg ulcer all things considered. He is actually coming back to see [place] after having had a reopening of the ulcers  on his left leg in  the past several weeks. He is out of dressing supplies and his wife is been try to take care of this as best she can. 06/10/2020 upon evaluation today patient unfortunately appears to be doing significantly worse today compared to when I last saw him. He and his wife both tell me that "there has been a lot going on". I did not actually go into detail about everything that has been happening but nonetheless he has not obviously been wearing his compression. He brings them with him today but they were not on. I am not even certain to be honest that he could get those on at this point. Nonetheless I do believe that he is going to require compression wrapping at some point shortly but right now we need to try to get what appears to be infection under control at this time. 06/28/2020 upon evaluation today patient appears to be doing well with regard to 06/28/2020 upon evaluation today patient appears to be doing well pretty well in regard to his left leg which is much better than previous. With that being said in regard to his right leg he does seem to be having some issues with a new wound after he sustained a fall. This fortunately is not too deep but does appear to be something we need to address. Neither deep which is good. 07/05/2020 on evaluation today patient appears to be doing well with regard to his wounds. In fact everything on the left leg appears to be pretty much healed on the right leg this is very close though not completely closed yet. Fortunately there is no evidence of active infection at this time. No fevers, chills, nausea, vomiting, or diarrhea. Electronic Signature(s) Signed: 07/05/2020 2:03:33 PM By: Worthy Keeler PA-C Entered By: Worthy Keeler on 07/05/2020 14:03:33 Adam Merritt ([id number]) -------------------------------------------------------------------------------- Physical Exam Details Patient Name: Adam Merritt Date of Service: 07/05/2020 1:30  PM Medical Record Number: [id number] Patient Account Number: [id number] Date of Birth/Sex: 1945-08-27 (74 y.o. M) Treating RN: Carlene Coria Primary Care Provider: [name] Other Clinician: Referring Provider: [name] Treating Provider/Extender: Jeri Cos [name]: 6 Constitutional Obese and well-hydrated in no acute distress. Respiratory normal breathing without difficulty. Psychiatric this patient is able to make decisions and demonstrates good insight into disease process. Alert and Oriented x 3. pleasant and cooperative. Notes Upon inspection patient's wound bed actually showed signs of good granulation at this time. There does not appear to be any evidence of active infection which is great news and overall I feel like he is very close to complete closure. The left leg is closed right leg is doing excellent in my opinion. Electronic Signature(s) Signed: 07/05/2020 2:03:52 PM By: Worthy Keeler PA-C Entered By: Worthy Keeler on 07/05/2020 14:03:51 Adam Merritt ([id number]) -------------------------------------------------------------------------------- Physician Orders Details Patient Name: Adam Merritt Date of Service: 07/05/2020 1:30 PM Medical Record Number: [id number] Patient Account Number: [id number] Date of Birth/Sex: 1945/07/15 (74 y.o. M) Treating RN: Carlene Coria Primary Care Provider: [name] Other Clinician: Referring Provider: [name] Treating Provider/Extender: Skipper Cliche in Treatment: 6 Verbal / Phone Orders: No Diagnosis Coding ICD-10 Coding Code Description I89.0 Lymphedema, not elsewhere classified I87.2 Venous insufficiency (chronic) (peripheral) L97.822 Non-pressure chronic ulcer of other part of left lower leg with fat layer exposed Follow-up Appointments o Return Appointment in 1 week. Edema Control - Lymphedema / Segmental Compressive Device / Other o Elevate legs to the level of the heart and pump ankles  as  often as possible o Elevate leg(s) parallel to the floor when sitting. Non-Wound Condition Bilateral Lower Extremities o Cleanse affected area with antibacterial soap and water, o Additional non-wound orders/instructions: - 4 layer compression Wound Treatment Wound #39 - Lower Leg Wound Laterality: Right, Lateral Cleanser: Normal Saline (Generic) 2 x Per Week/30 Days Discharge Instructions: Wash your hands with soap and water. Remove old dressing, discard into plastic bag and place into trash. Cleanse the wound with Normal Saline prior to applying a clean dressing using gauze sponges, not tissues or cotton balls. Do not scrub or use excessive force. Pat dry using gauze sponges, not tissue or cotton balls. Primary Dressing: Gauze (Generic) 2 x Per Week/30 Days Discharge Instructions: As directed: dry, moistened with saline or moistened with Dakins Solution Primary Dressing: Silvercel 4 1/4x 4 1/4 (in/in) (Generic) 2 x Per Week/30 Days Discharge Instructions: Apply Silvercel 4 1/4x 4 1/4 (in/in) as instructed Secondary Dressing: ABD Pad 5x9 (in/in) (Generic) 2 x Per Week/30 Days Discharge Instructions: Cover with ABD pad Compression Wrap: Profore LF 4 Multi-Layer Compression Bandaging System (Generic) 2 x Per Week/30 Days Discharge Instructions: Apply 4 multi-layer wrap as directed. Electronic Signature(s) Signed: 07/05/2020 4:15:06 PM By: Worthy Keeler PA-C Signed: 07/07/2020 9:59:20 AM By: Carlene Coria RN Entered By: Carlene Coria on 07/05/2020 14:07:37 Adam Merritt ([id number]) -------------------------------------------------------------------------------- Problem List Details Patient Name: Adam Merritt Date of Service: 07/05/2020 1:30 PM Medical Record Number: [id number] Patient Account Number: [id number] Date of Birth/Sex: 1945/12/14 (74 y.o. M) Treating RN: Carlene Coria Primary Care Provider: [name] Other Clinician: Referring Provider: [name] Treating  Provider/Extender: Skipper Cliche in Treatment: 6 Active Problems ICD-10 Encounter Code Description Active Date MDM Diagnosis I89.0 Lymphedema, not elsewhere classified 05/20/2020 No Yes I87.2 Venous insufficiency (chronic) (peripheral) 05/20/2020 No Yes L97.822 Non-pressure chronic ulcer of other part of left lower leg with fat layer 05/20/2020 No Yes exposed Inactive Problems Resolved Problems Electronic Signature(s) Signed: 07/05/2020 1:23:40 PM By: Worthy Keeler PA-C Entered By: Worthy Keeler on 07/05/2020 13:23:40 Adam Merritt ([id number]) -------------------------------------------------------------------------------- Progress Note Details Patient Name: Adam Merritt Date of Service: 07/05/2020 1:30 PM Medical Record Number: [id number] Patient Account Number: [id number] Date of Birth/Sex: 1946/01/15 (74 y.o. M) Treating RN: Carlene Coria Primary Care Provider: [name] Other Clinician: Referring Provider: [name] Treating Provider/Extender: Skipper Cliche in Treatment: 6 Subjective Chief Complaint Information obtained from Patient Left leg ulcers History of Present Illness (HPI) 10/18/17-He is here for initial evaluation of bilateral lower extremity ulcerations in the presence of venous insufficiency and lymphedema. He has been seen by vascular medicine in the past, Dr. Lucky Cowboy, last seen in 2016. He does have a history of abnormal ABIs, which is to be expected given his lymphedema and venous insufficiency. According to Epic, it appears that all attempts for arterial evaluation and/or angiography were not follow through with by patient. He does have a history of being seen in lymphedema clinic in 2018, stopped going approximately 6 months ago stating "it didn't do any good". He does not have lymphedema pumps, he does not have custom fit compression wrap/stockings. He is diabetic and his recent A1c last month was 7.6. He admits to chronic bilateral lower  extremity pain, no change in pain since blister and ulceration development. He is currently being treated with Levaquin for bronchitis. He has home health and we will continue. 10/25/17-He is here in follow-up evaluation for bilateral lower extremity ulcerationssubtle he remains on Levaquin for bronchitis.  Right lower extremity with no evidence of drainage or ulceration, persistent left lower extremity ulceration. He states that home health has not been out since his appointment. He went to [facility] on Tuesday, studies revealed: RIGHT ABI 0.9, TBI 0.6 LEFT ABI 1.1, TBI 0.6 with triphasic flow bilaterally. We will continue his same treatment plan. He has been educated on compression therapy and need for elevation. He will benefit from lymphedema pumps 11/01/17-He is here in follow-up evaluation for left lower extremity ulcer. The right lower extremity remains healed. He has home health services, but they have not been out to see the patient for 2-3 weeks. He states it home health physical therapy changed his dressing yesterday after therapy; he placed Ace wrap compression. We are still waiting for lymphedema pumps, reordered d/t need for company change. 11/08/17-He is here in follow-up evaluation for left lower extremity ulcer. It is improved. Edema is significantly improved with compression therapy. We will continue with same treatment plan and he will follow-up next week. No word regarding lymphedema pumps 11/15/17-He is here in follow-up evaluation for left lower extremity ulcer. He is healed and will be discharged from wound care services. I have reached out to [facility] regarding his lymphedema pumps. They have been unable to reach the patient; the contact number they had with the patient's wife's cell phone and she has not answered any unrecognized calls. Contact should be made today, trial planned for next week; [facility] will continue to follow 11/27/17 on  evaluation today patient has multiple blistered areas over the right lower extremity his left lower extremity appears to be doing okay. These blistered areas show signs of no infection which is great news. With that being said he did have some necrotic skin overlying which was mechanically debrided away with saline and gauze today without complication. Overall post debridement the wounds appear to be doing better but in general his swelling seems to be increased. This is obviously not good news. I think this is what has given rise to the blisters. 12/04/17 on evaluation today patient presents for follow-up concerning his bilateral lower extremity edema in the right lower extremity ulcers. He has been tolerating the dressing changes without complication. With that being said he has had no real issues with the wraps which is also good news. Overall I'm pleased with the progress he's been making. 12/11/17 on evaluation today patient appears to be doing rather well in regard to his right lateral lower extremity ulcer. He's been tolerating the dressing changes without complication. Fortunately there does not appear to be any evidence of infection at this time. Overall I'm pleased with the progress that is being made. Unfortunately he has been in the hospital due to having what sounds to be a stomach virus/flu fortunately that is starting to get better. 12/18/17 on evaluation today patient actually appears to be doing very well in regard to his bilateral lower extremities the swelling is under fairly good control his lymphedema pumps are still not up and running quite yet. With that being said he does have several areas of opening noted as far as wounds are concerned mainly over the left lower extremity. With that being said I do believe once he gets lymphedema pumps this would least help you mention some the fluid and preventing this from occurring. Hopefully that will be set up soon sleeves are Artie in place  at his home he just waiting for the machine. 12/25/17 on evaluation today patient actually appears  to be doing excellent in fact all of his ulcers appear to have resolved his legs appear very well. I do think he needs compression stockings we have discussed this and they are actually going to go to [place] today to elastic therapy to get this fitted for him. I think that is definitely a good thing to do. Readmission: 04/09/18 upon evaluation today this patient is seen for readmission due to bilateral lower extremity lymphedema. He has significant swelling of his extremities especially on the left although the right is also swollen he has weeping from both sides. There are no obvious open wounds at this point. Fortunately he has been doing fairly well for quite a bit of time since I last saw him. Nonetheless unfortunately this seems to have reopened and is giving quite a bit of trouble. He states this began about a week ago when he first called [place] to get in to be seen. No fevers, chills, nausea, or vomiting noted at this time. He has not been using his lymphedema pumps due to the fact that they won't fit on his leg at this point likewise is also not been using his compression for essentially the same reason. 04/16/18 upon evaluation today patient actually appears to be doing a little better in regard to the fluid in his bilateral lower extremities. With that being said he's had three falls since I saw him last week. He also states that he's been feeling very poorly. I was concerned last week and feel like that the concern is still there as far as the congestion in his chest is concerned he seems to be breathing about the same as last week but again he states he's very weak he's not even able to walk further than from the chair to the door. His wife had to buy a wheelchair just to be able to get them out of the house to get to the appointment today. This has me very concerned. MCKENNON, ZWART  ([id number]) 04/23/18 on evaluation today patient actually appears to be doing much better than last week's evaluation. At that time actually had to transport him to the ER via EMS and he subsequently was admitted for acute pulmonary edema, acute renal failure, and acute congestive heart failure. Fortunately he is doing much better. Apparently they did dialyze him and were able to take off roughly 35 pounds of fluid. Nonetheless he is feeling much better both in regard to his breathing and he's able to get around much better at this time compared to previous. Overall I'm very happy with how things are at this time. There does not appear to be any evidence of infection currently. No fevers, chills, nausea, or vomiting noted at this time. 04/30/2018 patient seen today for follow-up and management of bilateral lower extremity lymphedema. He did express being more sad today than usual due to the recent loss of his dog. He states that he has been compliant with using the lymphedema pumps. However he does admit a minute over the last 2-3 days he has not been using the pumps due to the recent loss of his dog. At this time there is no drainage or open wounds to his lower extremities. The left leg edema is measuring smaller today. Still has a significant amount of edema on bilateral lower extremities With dry flaky skin. He will be referred to the lymphedema clinic for further management. Will continue 3 layer compression wraps and follow-up in 1-2 weeks.Denies any pain, fever, chairs recently. No  recent falls or injuries reported during this visit. 05/07/18 on evaluation today patient actually appears to be doing very well in regard to his lower extremities in general all things considered. With that being said he is having some pain in the legs just due to the amount of swelling. He does have an area where he had a blister on the left lateral lower extremity this is open at this point other than that there's  nothing else weeping at this time. 05/14/18 on evaluation today patient actually appears to be doing excellent all things considered in regard to his lower extremities. He still has a couple areas of weeping on each leg which has continued to be the issue for him. He does have an appointment with the lymphedema clinic although this isn't until February 2020. That was the earliest they had. In the meantime he has continued to tolerate the compression wraps without complication. 05/28/18 on evaluation today patient actually appears to be doing more poorly in regard to his left lower extremity where he has a wound open at this point. He also had a fall where he subsequently injured his right great toe which has led to an open wound at the site unfortunately. He has been tolerating the dressing changes without complication in general as far as the wraps are concerned that he has not been putting any dressing on the left 1st toe ulcer site. 06/11/18 on evaluation today patient appears to be doing much worse in regard to his bilateral lower extremity ulcers. He has been tolerating the dressing changes without complication although his legs have not been wrapped more recently. Overall I am not very pleased with the way his legs appear. I do believe he needs to be back in compression wraps he still has not received his compression wraps from the [facility] hospital as of yet. 06/18/18 on evaluation today patient actually appears to be doing significantly better than last time I saw him. He has been tolerating the compression wraps without complication in the circumferential ulcers especially appear to be doing much better. His toe ulcer on the right in regard to the great toe is better although not as good as the legs in my pinion. No fevers chills noted 07/02/18 on evaluation today patient appears to be doing much better in regard to his lower extremity ulcers. Unfortunately since I last saw him he's had the distal  portion of his right great toe if he dated it sounds as if this actually went downhill very quickly. I had only seen him a few days prior and the toe did not appear to be infected at that point subsequently became infected very rapidly and it was decided by the surgeon that the distal portion of the toe needed to be removed. The patient seems to be doing well in this regard he tells me. With that being said his lower extremities are doing better from the standpoint of the wounds although he is significantly swollen at this point. 07/09/18 on evaluation today patient appears to be doing better in regard to the wounds on his lower extremities. In fact everything is almost completely healed he is just a small area on the left posterior lower extremity that is open at this point. 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 With that being said he needs some  kind of compression it does sound like he may not be wearing his compression, that is the wraps, during the entire time between when he's here visit to visit. Apparently his wife took the current one off because it began to "fall apart". 07/16/18 on evaluation today patient appears to be extremely swollen especially in regard to his left lower extremity unfortunately. He also has a new skin tear over the left lower extremity and there's a smaller area on the right lower extremity as well. Unfortunately this seems to be due in part to blistering and fluid buildup in his leg. He did get the reduction wraps that were ordered by the [facility] hospital for him to go to lymphedema clinic. With that being said his wounds on the legs have not healed to the point to where they would likely accept them as a patient lymphedema clinic currently. We need to try to get this to heal. With that  being said he's been taking his wraps off which is not doing him any favors at this point. In fact this is probably quite counterproductive compared to what needs to occur. We will likely need to increase to a four layer compression wrap and continue to also utilize elevation and he has to keep the wraps on not take them off as he's been doing currently hasn't had a wrap on since Saturday. 07/23/18 on evaluation today patient appears to be doing much better in regard to his bilateral lower extremities. In fact his left lower extremity which was the largest is actually 15 cm smaller today compared to what it was last time he was here in our clinic. This is obviously good news after just one week. Nonetheless the differences he actually kept the wraps on during the entire week this time. That's not typical for him. I do believe he understands a little bit better now the severity of the situation and why it's important for him to keep these wraps on. 07/30/18 on evaluation today patient actually appears to be doing rather well in regard to his lower extremities. His legs are much smaller than they have been in the past and he actually has only one very small rudder superficial region remaining that is not closed on the left lateral/posterior lower extremity even this is almost completely close. I do believe likely next week he will be healed without any complications. I do think we need to continue the wraps however this seems to be beneficial for him. I also think it may be a good time for [place] to go ahead and see about getting the appointment with the lymphedema clinic which is supposed to be made for him in order to keep this moving along and hopefully get them into compression wraps that will in the end help him to remain healed. 08/06/18 on evaluation today patient actually appears to be doing very well in regard as bilateral lower extremities. In fact his wounds appear to be completely healed at this  time. 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 He is in the process of getting appointment with the lymphedema clinic we have made this referral were just waiting to hear back on the schedule time. We need to follow up on that today as well. 08/13/18 on evaluation today patient actually appears to be doing very well in regard to his bilateral lower extremities there are no open wounds at this point. We are gonna go ahead and see about ordering  the Velcro compression wraps for him had a discussion with them about [place] doing it versus the [place] they feel like they can definitely afford going ahead and get the wraps themselves and they would prefer to try to avoid having to go to the lymphedema clinic if it all possible which I completely understand. As long as he has good compression I'm okay either way. Adam Merritt, Adam Merritt ([id number]) 3/17//20 on evaluation today patient actually appears to be doing well in regard to his bilateral lower extremity ulcers. He has been tolerating the dressing changes without complication specifically the compression wraps. Overall is had no issues my fingers finding that I see at this point is that he is having trouble with constipation. He tells me he has not been able to go to the bathroom for about six days. He's taken to over-the- counter oral laxatives unfortunately this is not helping. He has not contacted his doctor. 08/27/18 on evaluation today patient appears to be doing fairly well in regard to his lower extremities at this point. There does not appear to be any new altars is swelling is very well controlled. We are still waiting for his Velcro compression wraps to arrive that should be sometime in the next week is Artie sent the check for these. 09/03/18 on evaluation today patient appears to be doing excellent in regard to his bilateral lower extremities which he shows no signs of wound openings in regard to  this point. He does have his Velcro compression wraps which did arrive in the mail since I saw him last week. Overall he is doing excellent in my pinion. 09/13/18 on evaluation today patient appears to be doing very well currently in regard to the overall appearance of his bilateral lower extremities although he's a little bit more swollen than last time we saw him. At that point he been discharged without any open wounds. Nonetheless he has a small open wound on the posterior left lower extremity with some evidence of cellulitis noted as well. Fortunately I feel like he has made good progress overall with regard to his lower extremities from were things used to be. 09/20/18 on evaluation today patient actually appears to be doing much better. The erythematous lower extremity is improving wound itself which is still open appears to be doing much better as far as but appearance as well as pain is concerned overall very pleased in this regard. There's no signs of active infection at this time. 09/27/18 on evaluation today patient's wounds on the lower extremity actually appear to be doing fairly well at this time which is good news. There is no evidence of active infection currently and again is just as left lower extremity were there any wounds at this point anyway. I believe they may be completely healed but again I'm not 100% sure based on evaluation today. I think one more week of observation would likely be a good idea. 10/04/18 on evaluation today patient actually appears to be doing excellent in regard to the left lower extremity which actually appears to be completely healed as of today. Unfortunately he's been having issues with his right lower extremity have a new wound that has opened. Fortunately there's no evidence of active infection at this time which is good news. 10/10/18 upon evaluation today patient actually appears to be doing a little bit worse with the new open area on his right  posterior lower extremity. He's been tolerating the dressing changes without complication. Right now we been using his  compression wraps although I think we may need to switch back to actually performing bilateral compression wraps are in the clinic. No fevers, chills, nausea, or vomiting noted at this time. 10/17/18 on evaluation today patient actually appears to be doing quite well in regard to his bilateral lower extremity ulcers. He has been tolerating the reps without complication although he would prefer not to rewrap his legs as of today. Fortunately there's no signs of active infection which is good news. No fevers, chills, nausea, or vomiting noted at this time. 10/24/18 on evaluation today patient appears to be doing very well in regard to his lower extremities. His right lower extremity is shown signs of healing and his left lower Trinity though not healed appears to be improving which is excellent news. Overall very pleased with how things seem to be progressing at this time. The patient is likewise happy to hear this. His Velcro compression wraps however have not been put on properly will gonna show his wife how to do that properly today. 10/31/18 on evaluation today patient appears to be doing more poorly in regard to his left lower extremity in particular although both lower extremities actually are showing some signs of being worse than my previous evaluation. Unfortunately I'm just not sure that his compression stockings even with the use of the compression/lymphedema pumps seem to be controlling this well. Upon further questioning he tells me that he also is not able to lie flat in the bed due to his congestive heart failure and difficulty breathing. For that reason he sleeps in his recliner. To make matters worse his recliner also cannot even hold his legs up so instead of even being somewhat elevated they pretty much hang down to the floor. This is the way he sleeps each night which  is definitely counterproductive to everything else that were attempting to do from the standpoint of controlling his fluid. Nonetheless I think that he potentially could benefit from a hospital bed although this would be something that his primary care provider would likely have to order since anything that is order on our side has to be directly related to wound care and again the hospital bed is not necessarily a direct relation to although I think it does contribute to his overall wound status on his lower extremities. 11/07/18 on evaluation today patient appears to be doing better in regard to his bilateral lower extremity. He's been tolerating the dressing changes without complication. We did in the interim since I last saw him switch to just using extras orbiting new alginate and that seems to have done much better for him. I'm very pleased with the overall progress that is made. 11/14/18 on evaluation today patient appears to be redoing rather well in regard to his left lower extremity ulcers which are the only ones remaining at this point. Fortunately there's no signs of active infection at this time which is good news. Overall been very pleased with how things seem to be progressing currently. No fevers, chills, nausea, or vomiting noted at this time. 11/20/17 on evaluation today patient actually appears to be doing quite well in regard to his lower extremities on the left on the right he has several blisters that showed up although there's some question about whether or not he has had his Adam Merritt compression wraps on like he was supposed to or not. Fortunately there's no signs of active infection at this time which is good news. Unfortunately though he is doing well from the  standpoint of the left leg the right leg is not doing as well again this is when we did not wrap last week. 11/28/18 on evaluation today patient appears to be doing well in regard to his bilateral lower extremities is left  appears to be healed is right is not healed but is very close to being so. Overall very pleased with how things seem to be progressing. Patient is likewise happy that things are doing well. 12/09/18 on evaluation today patient actually appears to be completely healed which is excellent news. He actually seems to be doing well in regard to the swelling of the bilateral lower extremities which is also great news. Overall very pleased with how things seem to be progressing. Readmission: 03/18/2019 upon evaluation today patient presents for reevaluation concerning issues that he is having with his left lower extremity where he does have a wound noted upon inspection today. He also has a wound on the right second toe on the tip where it is apparently been rubbing in his shoe I did look at issues and you can actually see where this has been occurring as well. Fortunately there is no signs of infection with regard to Adam Merritt, Adam Merritt. ([id number]) the toe necessarily although it is very swollen compared to normal that does have me somewhat concerned about the possibility of further evaluation for infection/osteomyelitis. I would recommend an x-ray to start with. Otherwise he states that it is been 1-2 weeks that he has had the draining in regard to left lower extremity he does not know how this happened he has been wearing his compression appropriately and I do see that as well today but nonetheless I do think that he needs to continue to be very cautious with regard to elevation as well. 03/25/2019 on evaluation today patient appears to be doing much better in regard to his lower extremity at this time. That is on the left. He did culture positive for Pseudomonas but the good news is he seems to be doing much better the gentamicin we applied topically seems to have done a great job for him. There is no signs of active infection at this time systemically and locally he is doing much better. No fevers, chills,  nausea, vomiting, or diarrhea. In regard to his right toe this seems to be doing much better there is very little pressure noted at this point I did clean up some of the callus today around the edges of the wound as well as the surface of the wound but to be honest he is progressing quite nicely. 10/30; the area on the left anterior lower tibia area is healed over. His edema control is adequate even though his use of the compression pumps seems very intermittent. He has a juxta lite stocking on the right leg and a think there is 1 for the left leg at home. His wife is putting these on. He has an area on the plantar right second toe which is a hammertoe they are trying to offload this 04/08/2019 on evaluation today patient appears to be doing well in regard to his lower extremities which is good news. We are seeing him today for his toe ulcer which is giving him a little bit more trouble but again still seems to be doing better at this point which is good news. He is going require some sharp debridement in regard to the toe today however. 04/15/2019 on evaluation today patient actually appears to be doing quite well with regard to  his toe ulcer. I am very pleased with how things are doing in that regard. With regard to his lower extremity edema in general he tells me he is not been using the lymphedema pumps regularly although he does not use them. I think that he needs to use them more regularly he does have a small blister on the left lower extremity this is not open at this point but obviously it something that can get worse if he does not keep the compression going and the pumps going as well. 04/22/2019 on evaluation today patient appears to be doing well with regard to his toe ulcer. He has been tolerating the dressing changes without complication. This is measuring slightly smaller this week as compared to last week. Fortunately there is no signs of active infection at this time. 05/06/2019 on  evaluation today patient actually appears to be doing excellent. In fact his toe appears to be completely healed which is great news. There is no signs of active infection at this time. Readmission: Patient presents today for follow-up after having been discharged at the beginning of December 2020. He states that in recent weeks things have reopened and has been having more trouble at this point. With that being said fortunately there is no signs of active infection at this time. No fever chills noted. Nonetheless he also does have an issue with one of his toes as well that seems to be somewhat this is on the right foot second toe. 07/25/2019 upon evaluation today patient's lower extremities appear to be doing excellent bilaterally. I really feel like he is showing signs of great improvement and in fact I think he is close to healing which is great news. There is no signs of active infection at this time. No fevers, chills, nausea, vomiting, or diarrhea. 07/31/2019 upon evaluation today patient appears to be doing excellent and in fact I think may be completely healed in regard to his right lower extremity that were still to monitor this 1 more week before closing it out. The left lower extremity is measuring smaller although not completely closed seems to be doing excellent. 08/07/2019 upon evaluation today patient appears to be doing excellent in regard to his wounds. In fact the right lower extremity is completely healed there is no issues here. The left lower extremity has 1 very small area still remaining fortunately there is no signs of infection and overall I think he is very close to closure of the here as well. 08/14/19 upon evaluation today patient appears to be doing excellent in regard to his lower extremities. In fact he appears to be completely healed as of today. Fortunately there's no signs of active infection at this time. No fevers, chills, nausea, or vomiting noted at this  time. READMISSION 09/26/2019 This is a 75 year old man who is well-known to this clinic having just been discharged on 08/14/2019. He has known lymphedema and chronic venous insufficiency. He has Farrow wrap stockings and also external compression pumps. He does not use the external compression pumps however. He does however apparently fairly faithfully use the stockings. They have not been lotion in his legs. He has developed new wounds on the right anterior as well as the left medial left lateral and left posterior calf. He returns for our review of this Past medical history includes coronary artery disease, hypertension, congestive heart failure, COPD, venous insufficiency with lymphedema, type 2 diabetes. He apparently has compression pumps, Farrow wraps and at 1 point a hospital bed although  I believe he sleeps in a recliner 10/03/2019 upon evaluation today patient appears to be doing better with regard to his wounds in general. He has been tolerating the dressing changes without complication. Fortunately there is no signs of active infection. No fevers, chills, nausea, vomiting, or diarrhea. I do believe the compression wraps are helping as they always have in the past. 10/10/2019 upon evaluation today patient appears to be doing excellent at this point. His right lower extremity is measuring much better and in fact is completely healed. His left lower extremity is also measuring better though not healed seems to be doing excellent at this point which is great news. Overall I am very pleased with how things appear currently. 10/27/2019 upon evaluation today patient appears to be doing quite well with regard to his wounds currently. He has been tolerating the dressing changes without complication. Fortunately there is no signs of active infection at this time. In fact he is almost completely healed on the left and has just a very small area open and on the right he is completely healed. 11/04/2019  upon evaluation today patient appears to be doing quite well with regard to his wounds. In fact he appears to be quite possibly completely healed on the left lower extremity now as well the right lower extremity is still doing well. With that being said unfortunately though this may be healed I think it is a very fragile area and I cannot even confirm there is not a very small opening still remaining. I really think he may benefit from 1 additional weeks wrapping before discontinuing. Adam Merritt, Adam Merritt ([id number]) 11/10/2019 upon evaluation today patient appears to be doing well in regard to his original wounds in fact everything that we were treating last week is completely healed on the left. Unfortunately he has a new skin tear just above where the wrap slipped down due to a fall he sustained causing this injury. 11/17/2019 on evaluation today patient appears to be doing better with regard to his wound. He has been tolerating the dressing changes without complication. Fortunately there is no signs of active infection at this time. No fevers, chills, nausea, vomiting, or diarrhea. 11/24/2019 upon evaluation today patient appears to be doing well with regard to his wound. This is making good progress there does not appear to be signs of active infection but overall I do feel like he is headed in the correct direction. Overall he is tolerating the compression wrap as well. 12/02/2019 upon evaluation today patient appears to be doing well with regard to his leg ulcer. In fact this appears to be completely healed and this is the last of the wounds that he had but still the left anterior lower extremity. Fortunately there is no signs of active infection at this time. No fevers, chills, nausea, vomiting, or diarrhea. Readmission: 02/05/2020 on evaluation today patient appears to be doing well with regard to his wound all things considered. He actually tells me that a couple weeks ago he had trouble with his  wraps and therefore took him off for a couple of days in order to give his legs a break. It was during this time that he actually developed increased swelling and edema of his legs and blisters on both legs. That is when he called to make the appointment. Nonetheless since that time he began wearing his compression wraps which are Velcro in the meantime and has done extremely well with this. In fact his leg appears to be under  good control as far as edema is concerned today it is nothing like what I am seeing when he has come in for evaluations in the past. They have been using some of the silver alginate at home which has been beneficial for him. Fortunately there is no signs of active infection at this time. No fevers, chills, nausea, vomiting, or diarrhea. 02/19/2020 on evaluation today patient unfortunately does not appear to be doing quite as well as I would like to see. He has no signs of active infection at this time but unfortunately he is not doing well in regard to the overall appearance of his left leg. He has a couple other areas that are weeping now and the leg is much more swollen. I think that he needs to actually have a compression wrap to try to manage this. 02/26/2020 on evaluation today patient appears to be doing well with regard to his left lower extremity ulcer. Fortunately the swelling is down I do believe the pressure wrap seems to be doing quite well. Fortunately there is no signs of active infection at this time. 03/05/2020 upon evaluation today patient appears to be doing excellent in regard to his legs at this point. Fortunately there is no signs of active infection which is great news. Overall he feels like he is completely healed which is great news. Readmission: 05/20/2020 upon evaluation today patient appears to be doing well with regard to his leg ulcer all things considered. He is actually coming back to see [place] after having had a reopening of the ulcers on his left leg  in the past several weeks. He is out of dressing supplies and his wife is been try to take care of this as best she can. 06/10/2020 upon evaluation today patient unfortunately appears to be doing significantly worse today compared to when I last saw him. He and his wife both tell me that "there has been a lot going on". I did not actually go into detail about everything that has been happening but nonetheless he has not obviously been wearing his compression. He brings them with him today but they were not on. I am not even certain to be honest that he could get those on at this point. Nonetheless I do believe that he is going to require compression wrapping at some point shortly but right now we need to try to get what appears to be infection under control at this time. 06/28/2020 upon evaluation today patient appears to be doing well with regard to 06/28/2020 upon evaluation today patient appears to be doing well pretty well in regard to his left leg which is much better than previous. With that being said in regard to his right leg he does seem to be having some issues with a new wound after he sustained a fall. This fortunately is not too deep but does appear to be something we need to address. Neither deep which is good. 07/05/2020 on evaluation today patient appears to be doing well with regard to his wounds. In fact everything on the left leg appears to be pretty much healed on the right leg this is very close though not completely closed yet. Fortunately there is no evidence of active infection at this time. No fevers, chills, nausea, vomiting, or diarrhea. Objective Constitutional Obese and well-hydrated in no acute distress. Vitals Time Taken: 1:30 PM, Height: 66 in, Weight: 288 lbs, BMI: 46.5, Temperature: 98.3 F, Pulse: 67 bpm, Respiratory Rate: 20 breaths/min, Blood Pressure: 137/82 mmHg. Respiratory  normal breathing without difficulty. Psychiatric this patient is able to make  decisions and demonstrates good insight into disease process. Alert and Oriented x 3. pleasant and cooperative. Adam Merritt, Adam Merritt ([id number]) General Notes: Upon inspection patient's wound bed actually showed signs of good granulation at this time. There does not appear to be any evidence of active infection which is great news and overall I feel like he is very close to complete closure. The left leg is closed right leg is doing excellent in my opinion. Integumentary (Hair, Skin) Wound #37 status is Healed - Epithelialized. Original cause of wound was Gradually Appeared. The wound is located on the Left,Medial Lower Leg. The wound measures 0cm length x 0cm width x 0cm depth; 0cm^2 area and 0cm^3 volume. Wound #38 status is Healed - Epithelialized. Original cause of wound was Gradually Appeared. The wound is located on the Left,Posterior Lower Leg. The wound measures 0cm length x 0cm width x 0cm depth; 0cm^2 area and 0cm^3 volume. Wound #39 status is Open. Original cause of wound was Skin Tear/Laceration. The wound is located on the Right,Lateral Lower Leg. The wound measures 5.2cm length x 4cm width x 0.1cm depth; 16.336cm^2 area and 1.634cm^3 volume. There is Fat Layer (Subcutaneous Tissue) exposed. There is no tunneling or undermining noted. There is a large amount of serous drainage noted. The wound margin is flat and intact. There is large (67-100%) red, pink granulation within the wound bed. There is a small (1-33%) amount of necrotic tissue within the wound bed including Adherent Slough. Wound #40 status is Healed - Epithelialized. Original cause of wound was Gradually Appeared. The wound is located on the Left,Lateral Lower Leg. The wound measures 0cm length x 0cm width x 0cm depth; 0cm^2 area and 0cm^3 volume. Assessment Active Problems ICD-10 Lymphedema, not elsewhere classified Venous insufficiency (chronic) (peripheral) Non-pressure chronic ulcer of other part of left lower leg with  fat layer exposed Plan Follow-up Appointments: Return Appointment in 1 week. Edema Control - Lymphedema / Segmental Compressive Device / Other: Elevate legs to the level of the heart and pump ankles as often as possible Elevate leg(s) parallel to the floor when sitting. Non-Wound Condition: Cleanse affected area with antibacterial soap and water, WOUND #39: - Lower Leg Wound Laterality: Right, Lateral Cleanser: Normal Saline (Generic) 2 x Per Week/30 Days Discharge Instructions: Wash your hands with soap and water. Remove old dressing, discard into plastic bag and place into trash. Cleanse the wound with Normal Saline prior to applying a clean dressing using gauze sponges, not tissues or cotton balls. Do not scrub or use excessive force. Pat dry using gauze sponges, not tissue or cotton balls. Primary Dressing: Gauze (Generic) 2 x Per Week/30 Days Discharge Instructions: As directed: dry, moistened with saline or moistened with Dakins Solution Primary Dressing: Silvercel 4 1/4x 4 1/4 (in/in) (Generic) 2 x Per Week/30 Days Discharge Instructions: Apply Silvercel 4 1/4x 4 1/4 (in/in) as instructed Secondary Dressing: ABD Pad 5x9 (in/in) (Generic) 2 x Per Week/30 Days Discharge Instructions: Cover with ABD pad Compression Wrap: Profore LF 4 Multi-Layer Compression Bandaging System (Generic) 2 x Per Week/30 Days Discharge Instructions: Apply 4 multi-layer wrap as directed. 1. Would recommend that we going continue with wound care measures as before the patient is in agreement with plan that includes the use of the silver alginate dressing which I think has been beneficial for the patient. 2. I am also can recommend that we have the patient continue with the compression wrapping I do believe  the 4-layer compression wrap is doing a great job hopeful he may be able to get into his own wraps next week. 3. I am also can recommend that he continue to elevate his legs much as possible try to keep  edema under good control. We will see patient back for reevaluation in 1 week here in the clinic. If anything worsens or changes patient will contact our office for additional recommendations. Electronic Signature(s) SHAHIN, KNIERIM ([id number]) Signed: 07/05/2020 2:04:40 PM By: Worthy Keeler PA-C Entered By: Worthy Keeler on 07/05/2020 14:04:39 Adam Merritt ([id number]) -------------------------------------------------------------------------------- SuperBill Details Patient Name: Adam Merritt Date of Service: 07/05/2020 Medical Record Number: [id number] Patient Account Number: [id number] Date of Birth/Sex: [birth year] (74 y.o. M) Treating RN: Carlene Coria Primary Care Provider: [name] Other Clinician: Referring Provider: [name] Treating Provider/Extender: Skipper Cliche in Treatment: 6 Diagnosis Coding ICD-10 Codes Code Description I89.0 Lymphedema, not elsewhere classified I87.2 Venous insufficiency (chronic) (peripheral) L97.822 Non-pressure chronic ulcer of other part of left lower leg with fat layer exposed Facility Procedures CPT4: Description Modifier Quantity Code [id number] BILATERAL: Application of multi-layer venous compression system; leg (below knee), including 1 ankle and foot. Physician Procedures CPT4 Code: [id number] Description: 70263 - WC PHYS LEVEL 3 - EST PT Modifier: Quantity: 1 CPT4 Code: Description: ICD-10 Diagnosis Description I89.0 Lymphedema, not elsewhere classified I87.2 Venous insufficiency (chronic) (peripheral) Modifier: Quantity: Electronic Signature(s) Signed: 07/06/2020 9:50:44 PM By: Worthy Keeler PA-C Signed: 07/07/2020 9:59:20 AM By: Carlene Coria RN Previous Signature: 07/05/2020 2:04:53 PM Version By: Worthy Keeler PA-C Entered By: Carlene Coria on 07/05/2020 16:44:06

## 2020-07-05 NOTE — Progress Notes (Addendum)
Adam Merritt, Adam Merritt ([id number]) Visit Report for 07/05/2020 Arrival Information Details Patient Name: Adam Merritt, Adam Merritt Date of Service: 07/05/2020 1:30 PM Medical Record Number: [id number] Patient Account Number: [id number] Date of Birth/Sex: [birth year] (75 y.o. M) Treating RN: Carlene Coria Primary Care Carynn Felling: [name] Other Clinician: Referring Lakitha Gordy: [name] Treating Joe Tanney/Extender: Skipper Cliche in Treatment: 6 Visit Information History Since Last Visit Added or deleted any medications: No Patient Arrived: Wheel Chair Any new allergies or adverse reactions: No Arrival Time: 13:28 Had a fall or experienced change in No Accompanied By: wife activities of daily living that may affect Transfer Assistance: None risk of falls: Patient Identification Verified: Yes Signs or symptoms of abuse/neglect since last visito No Secondary Verification Process Completed: Yes Hospitalized since last visit: No Implantable device outside of the clinic excluding No cellular tissue based products placed in the center since last visit: Has Dressing in Place as Prescribed: Yes Has Compression in Place as Prescribed: Yes Pain Present Now: No Electronic Signature(s) Signed: 07/05/2020 4:19:36 PM By: Lorine Bears RCP, RRT, CHT Entered By: Lorine Bears on 07/05/2020 13:29:43 Adam Merritt ([id number]) -------------------------------------------------------------------------------- Clinic Level of Care Assessment Details Patient Name: Adam Merritt Date of Service: 07/05/2020 1:30 PM Medical Record Number: [id number] Patient Account Number: [id number] Date of Birth/Sex: [birth year] (75 y.o. M) Treating RN: Carlene Coria Primary Care Phillippa Straub: [name] Other Clinician: Referring Nettie Cromwell: [name] Treating Daquavion Catala/Extender: Skipper Cliche in Treatment: 6 Clinic Level of Care Assessment Items TOOL 1 Quantity Score []  - Use when EandM and Procedure  is performed on INITIAL visit 0 ASSESSMENTS - Nursing Assessment / Reassessment []  - General Physical Exam (combine w/ comprehensive assessment (listed just below) when performed on new 0 pt. evals) []  - 0 Comprehensive Assessment (HX, ROS, Risk Assessments, Wounds Hx, etc.) ASSESSMENTS - Wound and Skin Assessment / Reassessment []  - Dermatologic / Skin Assessment (not related to wound area) 0 ASSESSMENTS - Ostomy and/or Continence Assessment and Care []  - Incontinence Assessment and Management 0 []  - 0 Ostomy Care Assessment and Management (repouching, etc.) PROCESS - Coordination of Care []  - Simple Patient / Family Education for ongoing care 0 []  - 0 Complex (extensive) Patient / Family Education for ongoing care []  - 0 Staff obtains Programmer, systems, Records, Test Results / Process Orders []  - 0 Staff telephones HHA, Nursing Homes / Clarify orders / etc []  - 0 Routine Transfer to another Facility (non-emergent condition) []  - 0 Routine Hospital Admission (non-emergent condition) []  - 0 New Admissions / Biomedical engineer / Ordering NPWT, Apligraf, etc. []  - 0 Emergency Hospital Admission (emergent condition) PROCESS - Special Needs []  - Pediatric / Minor Patient Management 0 []  - 0 Isolation Patient Management []  - 0 Hearing / Language / Visual special needs []  - 0 Assessment of Community assistance (transportation, D/C planning, etc.) []  - 0 Additional assistance / Altered mentation []  - 0 Support Surface(s) Assessment (bed, cushion, seat, etc.) INTERVENTIONS - Miscellaneous []  - External ear exam 0 []  - 0 Patient Transfer (multiple staff / Civil Service fast streamer / Similar devices) []  - 0 Simple Staple / Suture removal (25 or less) []  - 0 Complex Staple / Suture removal (26 or more) []  - 0 Hypo/Hyperglycemic Management (do not check if billed separately) []  - 0 Ankle / Brachial Index (ABI) - do not check if billed separately Has the patient been seen at the hospital within  the last three years: Yes Total Score: 0 Level Of Care: ____ Adam Merritt  Viona Gilmore ([id number]) Electronic Signature(s) Signed: 07/07/2020 9:59:20 AM By: Carlene Coria RN Entered By: Carlene Coria on 07/05/2020 16:43:45 Adam Merritt ([id number]) -------------------------------------------------------------------------------- Compression Therapy Details Patient Name: Adam Merritt Date of Service: 07/05/2020 1:30 PM Medical Record Number: [id number] Patient Account Number: [id number] Date of Birth/Sex: 04-13-1946 (75 y.o. M) Treating RN: Carlene Coria Primary Care Barret Esquivel: [name] Other Clinician: Referring Leigh Kaeding: [name] Treating Melesio Madara/Extender: Skipper Cliche in Treatment: 6 Compression Therapy Performed for Wound Assessment: NonWound Condition Lymphedema - Left Leg Performed By: Clinician Carlene Coria, RN Compression Type: Four Layer Post Procedure Diagnosis Same as Pre-procedure Electronic Signature(s) Signed: 07/07/2020 9:59:20 AM By: Carlene Coria RN Entered By: Carlene Coria on 07/05/2020 16:43:10 Adam Merritt ([id number]) -------------------------------------------------------------------------------- Compression Therapy Details Patient Name: Adam Merritt Date of Service: 07/05/2020 1:30 PM Medical Record Number: [id number] Patient Account Number: [id number] Date of Birth/Sex: 1945-10-22 (75 y.o. M) Treating RN: Carlene Coria Primary Care Adylee Leonardo: [name] Other Clinician: Referring Kaylor Simenson: [name] Treating Kary Sugrue/Extender: Skipper Cliche in Treatment: 6 Compression Therapy Performed for Wound Assessment: Wound #39 Right,Lateral Lower Leg Performed By: Clinician Carlene Coria, RN Compression Type: Four Layer Post Procedure Diagnosis Same as Pre-procedure Electronic Signature(s) Signed: 07/07/2020 9:59:20 AM By: Carlene Coria RN Entered By: Carlene Coria on 07/05/2020 16:43:24 Adam Merritt  ([id number]) -------------------------------------------------------------------------------- Encounter Discharge Information Details Patient Name: Adam Merritt Date of Service: 07/05/2020 1:30 PM Medical Record Number: [id number] Patient Account Number: [id number] Date of Birth/Sex: 07-28-1945 (74 y.o. M) Treating RN: Carlene Coria Primary Care Karson Reede: [name] Other Clinician: Referring Christa Fasig: [name] Treating Mililani Murthy/Extender: Skipper Cliche in Treatment: 6 Encounter Discharge Information Items Discharge Condition: Stable Ambulatory Status: Wheelchair Discharge Destination: Home Transportation: Private Auto Accompanied By: wife Schedule Follow-up Appointment: Yes Clinical Summary of Care: Patient Declined Electronic Signature(s) Signed: 07/07/2020 9:59:20 AM By: Carlene Coria RN Entered By: Carlene Coria on 07/06/2020 14:20:51 Adam Merritt ([id number]) -------------------------------------------------------------------------------- Lower Extremity Assessment Details Patient Name: Adam Merritt Date of Service: 07/05/2020 1:30 PM Medical Record Number: [id number] Patient Account Number: [id number] Date of Birth/Sex: 11/28/1945 (74 y.o. M) Treating RN: Cornell Barman Primary Care Haward Pope: [name] Other Clinician: Referring Naiyah Klostermann: [name] Treating Judithe Keetch/Extender: Jeri Cos [name]: 6 Edema Assessment Assessed: [Left: No] [Right: No] [Left: Edema] [Right: :] Calf Left: Right: Point of Measurement: 30 cm From Medial Instep 52 cm 45 cm Ankle Left: Right: Point of Measurement: 10 cm From Medial Instep 33.5 cm 28 cm Vascular Assessment Pulses: Dorsalis Pedis Palpable: [Left:Yes] [Right:Yes] Electronic Signature(s) Signed: 07/06/2020 6:03:28 PM By: Gretta Cool, BSN, RN, CWS, Kim RN, BSN Entered By: Gretta Cool, BSN, RN, CWS, Kim on 07/05/2020 13:51:22 Adam Merritt  ([id number]) -------------------------------------------------------------------------------- Multi Wound Chart Details Patient Name: Adam Merritt Date of Service: 07/05/2020 1:30 PM Medical Record Number: [id number] Patient Account Number: [id number] Date of Birth/Sex: 11-08-1945 (74 y.o. M) Treating RN: Carlene Coria Primary Care Aysen Shieh: [name] Other Clinician: Referring Amisha Pospisil: [name] Treating Yareth Kearse/Extender: Skipper Cliche in Treatment: 6 Vital Signs Height(in): 30 Pulse(bpm): 33 Weight(lbs): 288 Blood Pressure(mmHg): 137/82 Body Mass Index(BMI): 46 Temperature(F): 98.3 Respiratory Rate(breaths/min): 20 Photos: [37:No Photos] [38:No Photos] Wound Location: Left, Medial Lower Leg Left, Posterior Lower Leg Right, Lateral Lower Leg Wounding Event: Gradually Appeared Gradually Appeared Skin Tear/Laceration Primary Etiology: Lymphedema Lymphedema Lymphedema Secondary Etiology: Diabetic Wound/Ulcer of the Lower Diabetic Wound/Ulcer of the Lower N/A Extremity Extremity Comorbid History: N/A N/A Anemia, Lymphedema, Chronic Obstructive Pulmonary Disease (COPD), Sleep Apnea, Congestive Heart Failure,  Coronary Artery Disease, Hypertension, Peripheral Venous Disease, Type II Diabetes, Osteoarthritis, Neuropathy, Received Chemotherapy Date Acquired: 05/03/2020 05/03/2020 06/26/2020 Weeks of Treatment: 6 6 1  Wound Status: Healed - Epithelialized Healed - Epithelialized Open Measurements L x W x D (cm) 0x0x0 0x0x0 5.2x4x0.1 Area (cm) : 0 0 16.336 Volume (cm) : 0 0 1.634 % Reduction in Area: 100.00% 100.00% 47.50% % Reduction in Volume: 100.00% 100.00% 47.50% Classification: Full Thickness Without Exposed Full Thickness Without Exposed Full Thickness Without Exposed Support Structures Support Structures Support Structures Exudate Amount: N/A N/A Large Exudate Type: N/A N/A Serous Exudate Color: N/A N/A amber Wound Margin: N/A N/A Flat and Intact Granulation  Amount: N/A N/A Large (67-100%) Granulation Quality: N/A N/A Red, Pink Necrotic Amount: N/A N/A Small (1-33%) Epithelialization: N/A N/A None Wound Number: 40 N/A N/A Photos: No Photos N/A N/A Wound Location: Left, Lateral Lower Leg N/A N/A Wounding Event: Gradually Appeared N/A N/A Primary Etiology: Lymphedema N/A N/A Secondary Etiology: N/A N/A N/A Comorbid History: N/A N/A N/A Date Acquired: 06/28/2020 N/A N/A Weeks of Treatment: 1 N/A N/A Wound Status: Healed - Epithelialized N/A N/A Adam Merritt, Adam Merritt ([id number]) Measurements L x W x D (cm) 0x0x0 N/A N/A Area (cm) : 0 N/A N/A Volume (cm) : 0 N/A N/A % Reduction in Area: 100.00% N/A N/A % Reduction in Volume: 100.00% N/A N/A Classification: Full Thickness Without Exposed N/A N/A Support Structures Exudate Amount: N/A N/A N/A Exudate Type: N/A N/A N/A Exudate Color: N/A N/A N/A Wound Margin: N/A N/A N/A Granulation Amount: N/A N/A N/A Granulation Quality: N/A N/A N/A Necrotic Amount: N/A N/A N/A Exposed Structures: N/A N/A N/A Epithelialization: N/A N/A N/A Treatment Notes Electronic Signature(s) Signed: 07/07/2020 9:59:20 AM By: Carlene Coria RN Entered By: Carlene Coria on 07/05/2020 13:57:42 Adam Merritt ([id number]) -------------------------------------------------------------------------------- [employer] Details Patient Name: Adam Merritt Date of Service: 07/05/2020 1:30 PM Medical Record Number: [id number] Patient Account Number: [id number] Date of Birth/Sex: 03/01/1946 (74 y.o. M) Treating RN: Carlene Coria Primary Care Demetrice Combes: [name] Other Clinician: Referring Katianna Mcclenney: [name] Treating Aireonna Bauer/Extender: Skipper Cliche in Treatment: 6 Active Inactive Wound/Skin Impairment Nursing Diagnoses: Impaired tissue integrity Goals: Patient/caregiver will verbalize understanding of skin care regimen Date Initiated: 05/20/2020 Target Resolution Date: 07/21/2020 Goal Status:  Active Interventions: Assess ulceration(s) every visit Treatment Activities: Referred to DME Peggy Monk for dressing supplies : 05/20/2020 Notes: Electronic Signature(s) Signed: 07/07/2020 9:59:20 AM By: Carlene Coria RN Entered By: Carlene Coria on 07/05/2020 13:57:32 Adam Merritt ([id number]) -------------------------------------------------------------------------------- Pain Assessment Details Patient Name: Adam Merritt Date of Service: 07/05/2020 1:30 PM Medical Record Number: [id number] Patient Account Number: [id number] Date of Birth/Sex: 11-Jan-1946 (74 y.o. M) Treating RN: Cornell Barman Primary Care Aubrianna Orchard: [name] Other Clinician: Referring Aubryn Spinola: [name] Treating Estreya Clay/Extender: Skipper Cliche in Treatment: 6 Active Problems Location of Pain Severity and Description of Pain Patient Has Paino No Site Locations Pain Management and Medication Current Pain Management: Electronic Signature(s) Signed: 07/06/2020 6:03:28 PM By: Gretta Cool, BSN, RN, CWS, Kim RN, BSN Entered By: Gretta Cool, BSN, RN, CWS, Kim on 07/05/2020 13:45:54 Adam Merritt ([id number]) -------------------------------------------------------------------------------- Patient/Caregiver Education Details Patient Name: Adam Merritt Date of Service: 07/05/2020 1:30 PM Medical Record Number: [id number] Patient Account Number: [id number] Date of Birth/Gender: 01/08/1946 (74 y.o. M) Treating RN: Carlene Coria Primary Care Physician: [name] Other Clinician: Referring Physician: [name] Treating Physician/Extender: Skipper Cliche in Treatment: 6 Education Assessment Education Provided To: Patient Education Topics Provided Wound/Skin Impairment: Methods: Explain/Verbal Responses: State  content correctly Electronic Signature(s) Signed: 07/07/2020 9:59:20 AM By: Carlene Coria RN Entered By: Carlene Coria on 07/05/2020 16:44:17 Adam Merritt  ([id number]) -------------------------------------------------------------------------------- Wound Assessment Details Patient Name: Adam Merritt Date of Service: 07/05/2020 1:30 PM Medical Record Number: [id number] Patient Account Number: [id number] Date of Birth/Sex: November 14, 1945 (74 y.o. M) Treating RN: Cornell Barman Primary Care Aashvi Rezabek: [name] Other Clinician: Referring Jaala Bohle: [name] Treating Felcia Huebert/Extender: Jeri Cos [name]: 6 Wound Status Wound Number: 37 Primary Etiology: Lymphedema Wound Location: Left, Medial Lower Leg Secondary Etiology: Diabetic Wound/Ulcer of the Lower Extremity Wounding Event: Gradually Appeared Wound Status: Healed - Epithelialized Date Acquired: 05/03/2020 Weeks Of Treatment: 6 Clustered Wound: No Photos Photo Uploaded By: Gretta Cool, BSN, RN, CWS, Kim on 07/05/2020 14:02:45 Wound Measurements Length: (cm) Width: (cm) Depth: (cm) Area: (cm) Volume: (cm) 0 % Reduction in Area: 100% 0 % Reduction in Volume: 100% 0 0 0 Wound Description Classification: Full Thickness Without Exposed Support Structu res Treatment Notes Wound #37 (Lower Leg) Wound Laterality: Left, Medial Cleanser Peri-Wound Care Topical Primary Dressing Secondary Dressing Secured With Compression Wrap Compression Stockings Add-Ons Electronic Signature(s) Signed: 07/06/2020 6:03:28 PM By: Gretta Cool, BSN, RN, CWS, Kim RN, BSN [address] [place] ([id number]) Entered By: Gretta Cool, BSN, RN, CWS, Kim on 07/05/2020 13:46:59 Adam Merritt ([id number]) -------------------------------------------------------------------------------- Wound Assessment Details Patient Name: Adam Merritt Date of Service: 07/05/2020 1:30 PM Medical Record Number: [id number] Patient Account Number: [id number] Date of Birth/Sex: [birth year] (74 y.o. M) Treating RN: Cornell Barman Primary Care Rusty Villella: [name] Other Clinician: Referring Edwing Figley: [name] Treating  Ayisha Pol/Extender: Jeri Cos [name]: 6 Wound Status Wound Number: 38 Primary Etiology: Lymphedema Wound Location: Left, Posterior Lower Leg Secondary Etiology: Diabetic Wound/Ulcer of the Lower Extremity Wounding Event: Gradually Appeared Wound Status: Healed - Epithelialized Date Acquired: 05/03/2020 Weeks Of Treatment: 6 Clustered Wound: No Photos Photo Uploaded By: Gretta Cool, BSN, RN, CWS, Kim on 07/05/2020 14:03:17 Wound Measurements Length: (cm) Width: (cm) Depth: (cm) Area: (cm) Volume: (cm) 0 % Reduction in Area: 100% 0 % Reduction in Volume: 100% 0 0 0 Wound Description Classification: Full Thickness Without Exposed Support Structu res Treatment Notes Wound #38 (Lower Leg) Wound Laterality: Left, Posterior Cleanser Peri-Wound Care Topical Primary Dressing Secondary Dressing Secured With Compression Wrap Compression Stockings Add-Ons Electronic Signature(s) Signed: 07/06/2020 6:03:28 PM By: Gretta Cool, BSN, RN, CWS, Kim RN, BSN [address] [place] ([id number]) Entered By: Gretta Cool, BSN, RN, CWS, Kim on 07/05/2020 13:47:00 Adam Merritt ([id number]) -------------------------------------------------------------------------------- Wound Assessment Details Patient Name: Adam Merritt Date of Service: 07/05/2020 1:30 PM Medical Record Number: [id number] Patient Account Number: [id number] Date of Birth/Sex: Oct 30, 1945 (74 y.o. M) Treating RN: Cornell Barman Primary Care Grayson Pfefferle: [name] Other Clinician: Referring Omere Marti: [name] Treating Chananya Canizalez/Extender: Jeri Cos [name]: 6 Wound Status Wound Number: 39 Primary Lymphedema Etiology: Wound Location: Right, Lateral Lower Leg Wound Open Wounding Event: Skin Tear/Laceration Status: Date Acquired: 06/26/2020 Comorbid Anemia, Lymphedema, Chronic Obstructive Pulmonary Weeks Of Treatment: 1 History: Disease (COPD), Sleep Apnea, Congestive Heart Failure, Clustered Wound: No Coronary Artery  Disease, Hypertension, Peripheral Venous Disease, Type II Diabetes, Osteoarthritis, Neuropathy, Received Chemotherapy Photos Wound Measurements Length: (cm) 5.2 Width: (cm) 4 Depth: (cm) 0.1 Area: (cm) 16.336 Volume: (cm) 1.634 % Reduction in Area: 47.5% % Reduction in Volume: 47.5% Epithelialization: None Tunneling: No Undermining: No Wound Description Classification: Full Thickness Without Exposed Support Structu Wound Margin: Flat and Intact Exudate Amount: Large Exudate Type: Serous Exudate Color: amber res Foul Odor After Cleansing:  No Slough/Fibrino Yes Wound Bed Granulation Amount: Large (67-100%) Exposed Structure Granulation Quality: Red, Pink Fascia Exposed: No Necrotic Amount: Small (1-33%) Fat Layer (Subcutaneous Tissue) Exposed: Yes Necrotic Quality: Adherent Slough Tendon Exposed: No Muscle Exposed: No Joint Exposed: No Bone Exposed: No Treatment Notes Wound #39 (Lower Leg) Wound Laterality: Right, Lateral Cleanser Normal Saline HERRON, FERO ([id number]) Discharge Instruction: Wash your hands with soap and water. Remove old dressing, discard into plastic bag and place into trash. Cleanse the wound with Normal Saline prior to applying a clean dressing using gauze sponges, not tissues or cotton balls. Do not scrub or use excessive force. Pat dry using gauze sponges, not tissue or cotton balls. Peri-Wound Care Topical Primary Dressing Gauze Discharge Instruction: As directed: dry, moistened with saline or moistened with Dakins Solution Silvercel 4 1/4x 4 1/4 (in/in) Discharge Instruction: Apply Silvercel 4 1/4x 4 1/4 (in/in) as instructed Secondary Dressing ABD Pad 5x9 (in/in) Discharge Instruction: Cover with ABD pad Secured With Compression Wrap Profore LF [place] Discharge Instruction: Apply 4 multi-layer wrap as directed. Compression Stockings Add-Ons Electronic Signature(s) Signed: 07/06/2020 6:03:28 PM  By: Gretta Cool, BSN, RN, CWS, Kim RN, BSN Entered By: Gretta Cool, BSN, RN, CWS, Kim on 07/05/2020 13:47:45 Adam Merritt ([id number]) -------------------------------------------------------------------------------- Wound Assessment Details Patient Name: Adam Merritt Date of Service: 07/05/2020 1:30 PM Medical Record Number: [id number] Patient Account Number: [id number] Date of Birth/Sex: [birth year] (74 y.o. M) Treating RN: Cornell Barman Primary Care Meia Emley: [name] Other Clinician: Referring Leilanni Halvorson: [name] Treating Kaleia Longhi/Extender: Jeri Cos [name]: 6 Wound Status Wound Number: 40 Primary Etiology: Lymphedema Wound Location: Left, Lateral Lower Leg Wound Status: Healed - Epithelialized Wounding Event: Gradually Appeared Date Acquired: 06/28/2020 Weeks Of Treatment: 1 Clustered Wound: No Photos Photo Uploaded By: Gretta Cool, BSN, RN, CWS, Kim on 07/05/2020 14:03:17 Wound Measurements Length: (cm) Width: (cm) Depth: (cm) Area: (cm) Volume: (cm) 0 % Reduction in Area: 100% 0 % Reduction in Volume: 100% 0 0 0 Wound Description Classification: Full Thickness Without Exposed Support Structu res Treatment Notes Wound #40 (Lower Leg) Wound Laterality: Left, Lateral Cleanser Peri-Wound Care Topical Primary Dressing Secondary Dressing Secured With Compression Wrap Compression Stockings Add-Ons Electronic Signature(s) Signed: 07/06/2020 6:03:28 PM By: Gretta Cool, BSN, RN, CWS, Kim RN, BSN [address], [place] ([id number]) Entered By: Gretta Cool, BSN, RN, CWS, Kim on 07/05/2020 13:47:00 Adam Merritt ([id number]) -------------------------------------------------------------------------------- Vitals Details Patient Name: Adam Merritt Date of Service: 07/05/2020 1:30 PM Medical Record Number: [id number] Patient Account Number: [id number] Date of Birth/Sex: 10-Feb-1946 (74 y.o. M) Treating RN: Carlene Coria Primary Care Elody Kleinsasser: [name] Other Clinician: Referring  Goro Wenrick: [name] Treating Annaka Cleaver/Extender: Skipper Cliche in Treatment: 6 Vital Signs Time Taken: 13:30 Temperature (F): 98.3 Height (in): 66 Pulse (bpm): 67 Weight (lbs): 288 Respiratory Rate (breaths/min): 20 Body Mass Index (BMI): 46.5 Blood Pressure (mmHg): 137/82 Reference Range: 80 - 120 mg / dl Electronic Signature(s) Signed: 07/05/2020 4:19:36 PM By: Lorine Bears RCP, RRT, CHT Entered By: Lorine Bears on 07/05/2020 13:32:36

## 2020-07-07 DIAGNOSIS — I509 Heart failure, unspecified: Secondary | ICD-10-CM | POA: Diagnosis not present

## 2020-07-09 DIAGNOSIS — E1122 Type 2 diabetes mellitus with diabetic chronic kidney disease: Secondary | ICD-10-CM | POA: Diagnosis not present

## 2020-07-09 DIAGNOSIS — E1142 Type 2 diabetes mellitus with diabetic polyneuropathy: Secondary | ICD-10-CM | POA: Diagnosis not present

## 2020-07-09 DIAGNOSIS — N183 Chronic kidney disease, stage 3 unspecified: Secondary | ICD-10-CM | POA: Diagnosis not present

## 2020-07-09 DIAGNOSIS — S161XXD Strain of muscle, fascia and tendon at neck level, subsequent encounter: Secondary | ICD-10-CM | POA: Diagnosis not present

## 2020-07-09 DIAGNOSIS — I890 Lymphedema, not elsewhere classified: Secondary | ICD-10-CM | POA: Diagnosis not present

## 2020-07-09 DIAGNOSIS — I5032 Chronic diastolic (congestive) heart failure: Secondary | ICD-10-CM | POA: Diagnosis not present

## 2020-07-09 DIAGNOSIS — J441 Chronic obstructive pulmonary disease with (acute) exacerbation: Secondary | ICD-10-CM | POA: Diagnosis not present

## 2020-07-09 DIAGNOSIS — I130 Hypertensive heart and chronic kidney disease with heart failure and stage 1 through stage 4 chronic kidney disease, or unspecified chronic kidney disease: Secondary | ICD-10-CM | POA: Diagnosis not present

## 2020-07-12 ENCOUNTER — Other Ambulatory Visit: Payer: Self-pay

## 2020-07-12 ENCOUNTER — Encounter: Payer: Medicare PPO | Attending: Physician Assistant | Admitting: Physician Assistant

## 2020-07-12 DIAGNOSIS — I872 Venous insufficiency (chronic) (peripheral): Secondary | ICD-10-CM | POA: Diagnosis not present

## 2020-07-12 DIAGNOSIS — E1140 Type 2 diabetes mellitus with diabetic neuropathy, unspecified: Secondary | ICD-10-CM | POA: Diagnosis not present

## 2020-07-12 DIAGNOSIS — L97812 Non-pressure chronic ulcer of other part of right lower leg with fat layer exposed: Secondary | ICD-10-CM | POA: Diagnosis not present

## 2020-07-12 DIAGNOSIS — I509 Heart failure, unspecified: Secondary | ICD-10-CM | POA: Insufficient documentation

## 2020-07-12 DIAGNOSIS — L97822 Non-pressure chronic ulcer of other part of left lower leg with fat layer exposed: Secondary | ICD-10-CM | POA: Diagnosis not present

## 2020-07-12 DIAGNOSIS — E1151 Type 2 diabetes mellitus with diabetic peripheral angiopathy without gangrene: Secondary | ICD-10-CM | POA: Insufficient documentation

## 2020-07-12 DIAGNOSIS — I110 Hypertensive heart disease with heart failure: Secondary | ICD-10-CM | POA: Diagnosis not present

## 2020-07-12 DIAGNOSIS — E11622 Type 2 diabetes mellitus with other skin ulcer: Secondary | ICD-10-CM | POA: Insufficient documentation

## 2020-07-12 DIAGNOSIS — I890 Lymphedema, not elsewhere classified: Secondary | ICD-10-CM | POA: Diagnosis not present

## 2020-07-12 NOTE — Progress Notes (Addendum)
THURMON, MIZELL ([id number]) Visit Report for 07/12/2020 Chief Complaint Document Details Patient Name: Adam, Merritt Date of Service: 07/12/2020 1:45 PM Medical Record Number: [id number] Patient Account Number: [id number] Date of Birth/Sex: 1946/05/01 (75 y.o. M) Treating RN: Carlene Coria Primary Care Provider: [name] Other Clinician: Referring Provider: [name] Treating Provider/Extender: Skipper Cliche in Treatment: 7 Information Obtained from: Patient Chief Complaint Left leg ulcers Electronic Signature(s) Signed: 07/12/2020 2:04:12 PM By: Worthy Keeler PA-C Entered By: Worthy Keeler on 07/12/2020 14:04:12 Adam Merritt ([id number]) -------------------------------------------------------------------------------- HPI Details Patient Name: Adam Merritt Date of Service: 07/12/2020 1:45 PM Medical Record Number: [id number] Patient Account Number: [id number] Date of Birth/Sex: 1945-10-09 (74 y.o. M) Treating RN: Carlene Coria Primary Care Provider: [name] Other Clinician: Referring Provider: [name] Treating Provider/Extender: Skipper Cliche in Treatment: 7 History of Present Illness HPI Description: 10/18/17-He is here for initial evaluation of bilateral lower extremity ulcerations in the presence of venous insufficiency and lymphedema. He has been seen by vascular medicine in the past, Dr. Lucky Cowboy, last seen in 2016. He does have a history of abnormal ABIs, which is to be expected given his lymphedema and venous insufficiency. According to Epic, it appears that all attempts for arterial evaluation and/or angiography were not follow through with by patient. He does have a history of being seen in lymphedema clinic in 2018, stopped going approximately 6 months ago stating "it didn't do any good". He does not have lymphedema pumps, he does not have custom fit compression wrap/stockings. He is diabetic and his recent A1c last month was 7.6. He admits to chronic  bilateral lower extremity pain, no change in pain since blister and ulceration development. He is currently being treated with Levaquin for bronchitis. He has home health and we will continue. 10/25/17-He is here in follow-up evaluation for bilateral lower extremity ulcerationssubtle he remains on Levaquin for bronchitis. Right lower extremity with no evidence of drainage or ulceration, persistent left lower extremity ulceration. He states that home health has not been out since his appointment. He went to [facility] on Tuesday, studies revealed: RIGHT ABI 0.9, TBI 0.6 LEFT ABI 1.1, TBI 0.6 with triphasic flow bilaterally. We will continue his same treatment plan. He has been educated on compression therapy and need for elevation. He will benefit from lymphedema pumps 11/01/17-He is here in follow-up evaluation for left lower extremity ulcer. The right lower extremity remains healed. He has home health services, but they have not been out to see the patient for 2-3 weeks. He states it home health physical therapy changed his dressing yesterday after therapy; he placed Ace wrap compression. We are still waiting for lymphedema pumps, reordered d/t need for company change. 11/08/17-He is here in follow-up evaluation for left lower extremity ulcer. It is improved. Edema is significantly improved with compression therapy. We will continue with same treatment plan and he will follow-up next week. No word regarding lymphedema pumps 11/15/17-He is here in follow-up evaluation for left lower extremity ulcer. He is healed and will be discharged from wound care services. I have reached out to [facility] regarding his lymphedema pumps. They have been unable to reach the patient; the contact number they had with the patient's wife's cell phone and she has not answered any unrecognized calls. Contact should be made today, trial planned for next week; [facility] will continue to  follow 11/27/17 on evaluation today patient has multiple blistered areas over the right lower extremity his  left lower extremity appears to be doing okay. These blistered areas show signs of no infection which is great news. With that being said he did have some necrotic skin overlying which was mechanically debrided away with saline and gauze today without complication. Overall post debridement the wounds appear to be doing better but in general his swelling seems to be increased. This is obviously not good news. I think this is what has given rise to the blisters. 12/04/17 on evaluation today patient presents for follow-up concerning his bilateral lower extremity edema in the right lower extremity ulcers. He has been tolerating the dressing changes without complication. With that being said he has had no real issues with the wraps which is also good news. Overall I'm pleased with the progress he's been making. 12/11/17 on evaluation today patient appears to be doing rather well in regard to his right lateral lower extremity ulcer. He's been tolerating the dressing changes without complication. Fortunately there does not appear to be any evidence of infection at this time. Overall I'm pleased with the progress that is being made. Unfortunately he has been in the hospital due to having what sounds to be a stomach virus/flu fortunately that is starting to get better. 12/18/17 on evaluation today patient actually appears to be doing very well in regard to his bilateral lower extremities the swelling is under fairly good control his lymphedema pumps are still not up and running quite yet. With that being said he does have several areas of opening noted as far as wounds are concerned mainly over the left lower extremity. With that being said I do believe once he gets lymphedema pumps this would least help you mention some the fluid and preventing this from occurring. Hopefully that will be set up soon sleeves  are Artie in place at his home he just waiting for the machine. 12/25/17 on evaluation today patient actually appears to be doing excellent in fact all of his ulcers appear to have resolved his legs appear very well. I do think he needs compression stockings we have discussed this and they are actually going to go to [place] today to elastic therapy to get this fitted for him. I think that is definitely a good thing to do. Readmission: 04/09/18 upon evaluation today this patient is seen for readmission due to bilateral lower extremity lymphedema. He has significant swelling of his extremities especially on the left although the right is also swollen he has weeping from both sides. There are no obvious open wounds at this point. Fortunately he has been doing fairly well for quite a bit of time since I last saw him. Nonetheless unfortunately this seems to have reopened and is giving quite a bit of trouble. He states this began about a week ago when he first called [place] to get in to be seen. No fevers, chills, nausea, or vomiting noted at this time. He has not been using his lymphedema pumps due to the fact that they won't fit on his leg at this point likewise is also not been using his compression for essentially the same reason. 04/16/18 upon evaluation today patient actually appears to be doing a little better in regard to the fluid in his bilateral lower extremities. With that being said he's had three falls since I saw him last week. He also states that he's been feeling very poorly. I was concerned last week and feel like that the concern is still there as far as the congestion in his chest  is concerned he seems to be breathing about the same as last week but again he states he's very weak he's not even able to walk further than from the chair to the door. His wife had to buy a wheelchair just to be able to get them out of the house to get to the appointment today. This has me very  concerned. 04/23/18 on evaluation today patient actually appears to be doing much better than last week's evaluation. At that time actually had to transport him to the ER via EMS and he subsequently was admitted for acute pulmonary edema, acute renal failure, and acute congestive heart failure. Fortunately he is doing much better. Apparently they did dialyze him and were able to take off roughly 35 pounds of fluid. Nonetheless he is feeling much better both in regard to his breathing and he's able to get around much better at this time compared to previous. Overall I'm very Adam, Merritt ([id number]) happy with how things are at this time. There does not appear to be any evidence of infection currently. No fevers, chills, nausea, or vomiting noted at this time. 04/30/2018 patient seen today for follow-up and management of bilateral lower extremity lymphedema. He did express being more sad today than usual due to the recent loss of his dog. He states that he has been compliant with using the lymphedema pumps. However he does admit a minute over the last 2-3 days he has not been using the pumps due to the recent loss of his dog. At this time there is no drainage or open wounds to his lower extremities. The left leg edema is measuring smaller today. Still has a significant amount of edema on bilateral lower extremities With dry flaky skin. He will be referred to the lymphedema clinic for further management. Will continue 3 layer compression wraps and follow-up in 1-2 weeks.Denies any pain, fever, chairs recently. No recent falls or injuries reported during this visit. 05/07/18 on evaluation today patient actually appears to be doing very well in regard to his lower extremities in general all things considered. With that being said he is having some pain in the legs just due to the amount of swelling. He does have an area where he had a blister on the left lateral lower extremity this is open at this  point other than that there's nothing else weeping at this time. 05/14/18 on evaluation today patient actually appears to be doing excellent all things considered in regard to his lower extremities. He still has a couple areas of weeping on each leg which has continued to be the issue for him. He does have an appointment with the lymphedema clinic although this isn't until February 2020. That was the earliest they had. In the meantime he has continued to tolerate the compression wraps without complication. 05/28/18 on evaluation today patient actually appears to be doing more poorly in regard to his left lower extremity where he has a wound open at this point. He also had a fall where he subsequently injured his right great toe which has led to an open wound at the site unfortunately. He has been tolerating the dressing changes without complication in general as far as the wraps are concerned that he has not been putting any dressing on the left 1st toe ulcer site. 06/11/18 on evaluation today patient appears to be doing much worse in regard to his bilateral lower extremity ulcers. He has been tolerating the dressing changes without complication although his legs  have not been wrapped more recently. Overall I am not very pleased with the way his legs appear. I do believe he needs to be back in compression wraps he still has not received his compression wraps from the [facility] hospital as of yet. 06/18/18 on evaluation today patient actually appears to be doing significantly better than last time I saw him. He has been tolerating the compression wraps without complication in the circumferential ulcers especially appear to be doing much better. His toe ulcer on the right in regard to the great toe is better although not as good as the legs in my pinion. No fevers chills noted 07/02/18 on evaluation today patient appears to be doing much better in regard to his lower extremity ulcers. Unfortunately since I last  saw him he's had the distal portion of his right great toe if he dated it sounds as if this actually went downhill very quickly. I had only seen him a few days prior and the toe did not appear to be infected at that point subsequently became infected very rapidly and it was decided by the surgeon that the distal portion of the toe needed to be removed. The patient seems to be doing well in this regard he tells me. With that being said his lower extremities are doing better from the standpoint of the wounds although he is significantly swollen at this point. 07/09/18 on evaluation today patient appears to be doing better in regard to the wounds on his lower extremities. In fact everything is almost completely healed he is just a small area on the left posterior lower extremity that is open at this point. 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 With that being said he needs some kind of compression it does sound like he may not be wearing his compression, that is the wraps, during the entire time between when he's here visit to visit. Apparently his wife took the current one off because it began to "fall apart". 07/16/18 on evaluation today patient appears to be extremely swollen especially in regard to his left lower extremity unfortunately. He also has a new skin tear over the left lower extremity and there's a smaller area on the right lower extremity as well. Unfortunately this seems to be due in part to blistering and fluid buildup in his leg. He did get the reduction wraps that were ordered by the [facility] hospital for him to go to lymphedema clinic. With that being said his wounds on the legs have not healed to the point to where they would likely accept them as a patient lymphedema clinic currently. We need to try to  get this to heal. With that being said he's been taking his wraps off which is not doing him any favors at this point. In fact this is probably quite counterproductive compared to what needs to occur. We will likely need to increase to a four layer compression wrap and continue to also utilize elevation and he has to keep the wraps on not take them off as he's been doing currently hasn't had a wrap on since Saturday. 07/23/18 on evaluation today patient appears to be doing much better in regard to his bilateral lower extremities. In fact his left lower extremity which was the largest is actually 15 cm smaller today compared to what it  was last time he was here in our clinic. This is obviously good news after just one week. Nonetheless the differences he actually kept the wraps on during the entire week this time. That's not typical for him. I do believe he understands a little bit better now the severity of the situation and why it's important for him to keep these wraps on. 07/30/18 on evaluation today patient actually appears to be doing rather well in regard to his lower extremities. His legs are much smaller than they have been in the past and he actually has only one very small rudder superficial region remaining that is not closed on the left lateral/posterior lower extremity even this is almost completely close. I do believe likely next week he will be healed without any complications. I do think we need to continue the wraps however this seems to be beneficial for him. I also think it may be a good time for [place] to go ahead and see about getting the appointment with the lymphedema clinic which is supposed to be made for him in order to keep this moving along and hopefully get them into compression wraps that will in the end help him to remain healed. 08/06/18 on evaluation today patient actually appears to be doing very well in regard as bilateral lower extremities. In fact his wounds appear to  be completely healed at this time. 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 He is in the process of getting appointment with the lymphedema clinic we have made this referral were just waiting to hear back on the schedule time. We need to follow up on that today as well. 08/13/18 on evaluation today patient actually appears to be doing very well in regard to his bilateral lower extremities there are no open wounds at this point. We are gonna go ahead and see about ordering the Velcro compression wraps for him had a discussion with them about [place] doing it versus the [place] they feel like they can definitely afford going ahead and get the wraps themselves and they would prefer to try to avoid having to go to the lymphedema clinic if it all possible which I completely understand. As long as he has good compression I'm okay either way. 3/17//20 on evaluation today patient actually appears to be doing well in regard to his bilateral lower extremity ulcers. He has been tolerating the dressing changes without complication specifically the compression wraps. Overall is had no issues my fingers finding that I see at this point is that he is having trouble with constipation. He tells me he has not been able to go to the bathroom for about six days. He's taken to over-the- counter oral laxatives unfortunately this is not helping. He has not contacted his doctor. Adam, Merritt ([id number]) 08/27/18 on evaluation today patient appears to be doing fairly well in regard to his lower extremities at this point. There does not appear to be any new altars is swelling is very well controlled. We are still waiting for his Velcro compression wraps to arrive that should be sometime in the next week is Artie sent the check for these. 09/03/18 on evaluation today patient appears to be doing excellent in regard to his bilateral lower extremities which he shows no signs of  wound openings in regard to this point. He does have his Velcro compression wraps which did arrive in the mail since I saw him last week. Overall he is  doing excellent in my pinion. 09/13/18 on evaluation today patient appears to be doing very well currently in regard to the overall appearance of his bilateral lower extremities although he's a little bit more swollen than last time we saw him. At that point he been discharged without any open wounds. Nonetheless he has a small open wound on the posterior left lower extremity with some evidence of cellulitis noted as well. Fortunately I feel like he has made good progress overall with regard to his lower extremities from were things used to be. 09/20/18 on evaluation today patient actually appears to be doing much better. The erythematous lower extremity is improving wound itself which is still open appears to be doing much better as far as but appearance as well as pain is concerned overall very pleased in this regard. There's no signs of active infection at this time. 09/27/18 on evaluation today patient's wounds on the lower extremity actually appear to be doing fairly well at this time which is good news. There is no evidence of active infection currently and again is just as left lower extremity were there any wounds at this point anyway. I believe they may be completely healed but again I'm not 100% sure based on evaluation today. I think one more week of observation would likely be a good idea. 10/04/18 on evaluation today patient actually appears to be doing excellent in regard to the left lower extremity which actually appears to be completely healed as of today. Unfortunately he's been having issues with his right lower extremity have a new wound that has opened. Fortunately there's no evidence of active infection at this time which is good news. 10/10/18 upon evaluation today patient actually appears to be doing a little bit worse with the new  open area on his right posterior lower extremity. He's been tolerating the dressing changes without complication. Right now we been using his compression wraps although I think we may need to switch back to actually performing bilateral compression wraps are in the clinic. No fevers, chills, nausea, or vomiting noted at this time. 10/17/18 on evaluation today patient actually appears to be doing quite well in regard to his bilateral lower extremity ulcers. He has been tolerating the reps without complication although he would prefer not to rewrap his legs as of today. Fortunately there's no signs of active infection which is good news. No fevers, chills, nausea, or vomiting noted at this time. 10/24/18 on evaluation today patient appears to be doing very well in regard to his lower extremities. His right lower extremity is shown signs of healing and his left lower Trinity though not healed appears to be improving which is excellent news. Overall very pleased with how things seem to be progressing at this time. The patient is likewise happy to hear this. His Velcro compression wraps however have not been put on properly will gonna show his wife how to do that properly today. 10/31/18 on evaluation today patient appears to be doing more poorly in regard to his left lower extremity in particular although both lower extremities actually are showing some signs of being worse than my previous evaluation. Unfortunately I'm just not sure that his compression stockings even with the use of the compression/lymphedema pumps seem to be controlling this well. Upon further questioning he tells me that he also is not able to lie flat in the bed due to his congestive heart failure and difficulty breathing. For that reason he sleeps in his recliner. To make  matters worse his recliner also cannot even hold his legs up so instead of even being somewhat elevated they pretty much hang down to the floor. This is the way he  sleeps each night which is definitely counterproductive to everything else that were attempting to do from the standpoint of controlling his fluid. Nonetheless I think that he potentially could benefit from a hospital bed although this would be something that his primary care provider would likely have to order since anything that is order on our side has to be directly related to wound care and again the hospital bed is not necessarily a direct relation to although I think it does contribute to his overall wound status on his lower extremities. 11/07/18 on evaluation today patient appears to be doing better in regard to his bilateral lower extremity. He's been tolerating the dressing changes without complication. We did in the interim since I last saw him switch to just using extras orbiting new alginate and that seems to have done much better for him. I'm very pleased with the overall progress that is made. 11/14/18 on evaluation today patient appears to be redoing rather well in regard to his left lower extremity ulcers which are the only ones remaining at this point. Fortunately there's no signs of active infection at this time which is good news. Overall been very pleased with how things seem to be progressing currently. No fevers, chills, nausea, or vomiting noted at this time. 11/20/17 on evaluation today patient actually appears to be doing quite well in regard to his lower extremities on the left on the right he has several blisters that showed up although there's some question about whether or not he has had his broker compression wraps on like he was supposed to or not. Fortunately there's no signs of active infection at this time which is good news. Unfortunately though he is doing well from the standpoint of the left leg the right leg is not doing as well again this is when we did not wrap last week. 11/28/18 on evaluation today patient appears to be doing well in regard to his bilateral lower  extremities is left appears to be healed is right is not healed but is very close to being so. Overall very pleased with how things seem to be progressing. Patient is likewise happy that things are doing well. 12/09/18 on evaluation today patient actually appears to be completely healed which is excellent news. He actually seems to be doing well in regard to the swelling of the bilateral lower extremities which is also great news. Overall very pleased with how things seem to be progressing. Readmission: 03/18/2019 upon evaluation today patient presents for reevaluation concerning issues that he is having with his left lower extremity where he does have a wound noted upon inspection today. He also has a wound on the right second toe on the tip where it is apparently been rubbing in his shoe I did look at issues and you can actually see where this has been occurring as well. Fortunately there is no signs of infection with regard to the toe necessarily although it is very swollen compared to normal that does have me somewhat concerned about the possibility of further evaluation for infection/osteomyelitis. I would recommend an x-ray to start with. Otherwise he states that it is been 1-2 weeks that he has had the draining in regard to left lower extremity he does not know how this happened he has been wearing his compression appropriately and  I do see that as well today but nonetheless I do think that he needs to continue to be very cautious with regard to elevation as well. Adam, Merritt ([id number]) 03/25/2019 on evaluation today patient appears to be doing much better in regard to his lower extremity at this time. That is on the left. He did culture positive for Pseudomonas but the good news is he seems to be doing much better the gentamicin we applied topically seems to have done a great job for him. There is no signs of active infection at this time systemically and locally he is doing much better.  No fevers, chills, nausea, vomiting, or diarrhea. In regard to his right toe this seems to be doing much better there is very little pressure noted at this point I did clean up some of the callus today around the edges of the wound as well as the surface of the wound but to be honest he is progressing quite nicely. 10/30; the area on the left anterior lower tibia area is healed over. His edema control is adequate even though his use of the compression pumps seems very intermittent. He has a juxta lite stocking on the right leg and a think there is 1 for the left leg at home. His wife is putting these on. He has an area on the plantar right second toe which is a hammertoe they are trying to offload this 04/08/2019 on evaluation today patient appears to be doing well in regard to his lower extremities which is good news. We are seeing him today for his toe ulcer which is giving him a little bit more trouble but again still seems to be doing better at this point which is good news. He is going require some sharp debridement in regard to the toe today however. 04/15/2019 on evaluation today patient actually appears to be doing quite well with regard to his toe ulcer. I am very pleased with how things are doing in that regard. With regard to his lower extremity edema in general he tells me he is not been using the lymphedema pumps regularly although he does not use them. I think that he needs to use them more regularly he does have a small blister on the left lower extremity this is not open at this point but obviously it something that can get worse if he does not keep the compression going and the pumps going as well. 04/22/2019 on evaluation today patient appears to be doing well with regard to his toe ulcer. He has been tolerating the dressing changes without complication. This is measuring slightly smaller this week as compared to last week. Fortunately there is no signs of active infection at this  time. 05/06/2019 on evaluation today patient actually appears to be doing excellent. In fact his toe appears to be completely healed which is great news. There is no signs of active infection at this time. Readmission: Patient presents today for follow-up after having been discharged at the beginning of December 2020. He states that in recent weeks things have reopened and has been having more trouble at this point. With that being said fortunately there is no signs of active infection at this time. No fever chills noted. Nonetheless he also does have an issue with one of his toes as well that seems to be somewhat this is on the right foot second toe. 07/25/2019 upon evaluation today patient's lower extremities appear to be doing excellent bilaterally. I really feel like he  is showing signs of great improvement and in fact I think he is close to healing which is great news. There is no signs of active infection at this time. No fevers, chills, nausea, vomiting, or diarrhea. 07/31/2019 upon evaluation today patient appears to be doing excellent and in fact I think may be completely healed in regard to his right lower extremity that were still to monitor this 1 more week before closing it out. The left lower extremity is measuring smaller although not completely closed seems to be doing excellent. 08/07/2019 upon evaluation today patient appears to be doing excellent in regard to his wounds. In fact the right lower extremity is completely healed there is no issues here. The left lower extremity has 1 very small area still remaining fortunately there is no signs of infection and overall I think he is very close to closure of the here as well. 08/14/19 upon evaluation today patient appears to be doing excellent in regard to his lower extremities. In fact he appears to be completely healed as of today. Fortunately there's no signs of active infection at this time. No fevers, chills, nausea, or vomiting noted  at this time. READMISSION 09/26/2019 This is a 75 year old man who is well-known to this clinic having just been discharged on 08/14/2019. He has known lymphedema and chronic venous insufficiency. He has Farrow wrap stockings and also external compression pumps. He does not use the external compression pumps however. He does however apparently fairly faithfully use the stockings. They have not been lotion in his legs. He has developed new wounds on the right anterior as well as the left medial left lateral and left posterior calf. He returns for our review of this Past medical history includes coronary artery disease, hypertension, congestive heart failure, COPD, venous insufficiency with lymphedema, type 2 diabetes. He apparently has compression pumps, Farrow wraps and at 1 point a hospital bed although I believe he sleeps in a recliner 10/03/2019 upon evaluation today patient appears to be doing better with regard to his wounds in general. He has been tolerating the dressing changes without complication. Fortunately there is no signs of active infection. No fevers, chills, nausea, vomiting, or diarrhea. I do believe the compression wraps are helping as they always have in the past. 10/10/2019 upon evaluation today patient appears to be doing excellent at this point. His right lower extremity is measuring much better and in fact is completely healed. His left lower extremity is also measuring better though not healed seems to be doing excellent at this point which is great news. Overall I am very pleased with how things appear currently. 10/27/2019 upon evaluation today patient appears to be doing quite well with regard to his wounds currently. He has been tolerating the dressing changes without complication. Fortunately there is no signs of active infection at this time. In fact he is almost completely healed on the left and has just a very small area open and on the right he is completely  healed. 11/04/2019 upon evaluation today patient appears to be doing quite well with regard to his wounds. In fact he appears to be quite possibly completely healed on the left lower extremity now as well the right lower extremity is still doing well. With that being said unfortunately though this may be healed I think it is a very fragile area and I cannot even confirm there is not a very small opening still remaining. I really think he may benefit from 1 additional weeks wrapping before  discontinuing. 11/10/2019 upon evaluation today patient appears to be doing well in regard to his original wounds in fact everything that we were treating last week is completely healed on the left. Unfortunately he has a new skin tear just above where the wrap slipped down due to a fall he sustained causing this injury. 11/17/2019 on evaluation today patient appears to be doing better with regard to his wound. He has been tolerating the dressing changes without CAEDMON, LOUQUE. ([id number]) complication. Fortunately there is no signs of active infection at this time. No fevers, chills, nausea, vomiting, or diarrhea. 11/24/2019 upon evaluation today patient appears to be doing well with regard to his wound. This is making good progress there does not appear to be signs of active infection but overall I do feel like he is headed in the correct direction. Overall he is tolerating the compression wrap as well. 12/02/2019 upon evaluation today patient appears to be doing well with regard to his leg ulcer. In fact this appears to be completely healed and this is the last of the wounds that he had but still the left anterior lower extremity. Fortunately there is no signs of active infection at this time. No fevers, chills, nausea, vomiting, or diarrhea. Readmission: 02/05/2020 on evaluation today patient appears to be doing well with regard to his wound all things considered. He actually tells me that a couple weeks ago he had  trouble with his wraps and therefore took him off for a couple of days in order to give his legs a break. It was during this time that he actually developed increased swelling and edema of his legs and blisters on both legs. That is when he called to make the appointment. Nonetheless since that time he began wearing his compression wraps which are Velcro in the meantime and has done extremely well with this. In fact his leg appears to be under good control as far as edema is concerned today it is nothing like what I am seeing when he has come in for evaluations in the past. They have been using some of the silver alginate at home which has been beneficial for him. Fortunately there is no signs of active infection at this time. No fevers, chills, nausea, vomiting, or diarrhea. 02/19/2020 on evaluation today patient unfortunately does not appear to be doing quite as well as I would like to see. He has no signs of active infection at this time but unfortunately he is not doing well in regard to the overall appearance of his left leg. He has a couple other areas that are weeping now and the leg is much more swollen. I think that he needs to actually have a compression wrap to try to manage this. 02/26/2020 on evaluation today patient appears to be doing well with regard to his left lower extremity ulcer. Fortunately the swelling is down I do believe the pressure wrap seems to be doing quite well. Fortunately there is no signs of active infection at this time. 03/05/2020 upon evaluation today patient appears to be doing excellent in regard to his legs at this point. Fortunately there is no signs of active infection which is great news. Overall he feels like he is completely healed which is great news. Readmission: 05/20/2020 upon evaluation today patient appears to be doing well with regard to his leg ulcer all things considered. He is actually coming back to see [place] after having had a reopening of the ulcers  on his left leg in  the past several weeks. He is out of dressing supplies and his wife is been try to take care of this as best she can. 06/10/2020 upon evaluation today patient unfortunately appears to be doing significantly worse today compared to when I last saw him. He and his wife both tell me that "there has been a lot going on". I did not actually go into detail about everything that has been happening but nonetheless he has not obviously been wearing his compression. He brings them with him today but they were not on. I am not even certain to be honest that he could get those on at this point. Nonetheless I do believe that he is going to require compression wrapping at some point shortly but right now we need to try to get what appears to be infection under control at this time. 06/28/2020 upon evaluation today patient appears to be doing well with regard to 06/28/2020 upon evaluation today patient appears to be doing well pretty well in regard to his left leg which is much better than previous. With that being said in regard to his right leg he does seem to be having some issues with a new wound after he sustained a fall. This fortunately is not too deep but does appear to be something we need to address. Neither deep which is good. 07/05/2020 on evaluation today patient appears to be doing well with regard to his wounds. In fact everything on the left leg appears to be pretty much healed on the right leg this is very close though not completely closed yet. Fortunately there is no evidence of active infection at this time. No fevers, chills, nausea, vomiting, or diarrhea. 07/13/2019 upon evaluation today patient appears to be making good progress which is great news. Overall I am extremely pleased with where things stand today. There does not appear to be any signs of active infection which is great news and overall his left leg appears healed still the right leg though not healed does seem to be  making good progress which is excellent news. He is having no pain. Electronic Signature(s) Signed: 07/12/2020 4:33:21 PM By: Worthy Keeler PA-C Entered By: Worthy Keeler on 07/12/2020 16:33:21 Adam Merritt ([id number]) -------------------------------------------------------------------------------- Physical Exam Details Patient Name: Adam Merritt Date of Service: 07/12/2020 1:45 PM Medical Record Number: [id number] Patient Account Number: [id number] Date of Birth/Sex: Nov 21, 1945 (75 y.o. M) Treating RN: Carlene Coria Primary Care Provider: [name] Other Clinician: Referring Provider: [name] Treating Provider/Extender: Jeri Cos [name]: 7 Constitutional Well-nourished and well-hydrated in no acute distress. Respiratory normal breathing without difficulty. Psychiatric this patient is able to make decisions and demonstrates good insight into disease process. Alert and Oriented x 3. pleasant and cooperative. Notes Of active infection which is great news and overall very pleased with where things stand. In general I think he is continuing to make upon inspection patient's wound bed actually showed signs of good granulation and epithelization. There does not appear to be any signs good progress towards complete closure. Electronic Signature(s) Signed: 07/12/2020 4:33:43 PM By: Worthy Keeler PA-C Entered By: Worthy Keeler on 07/12/2020 16:33:43 Adam Merritt ([id number]) -------------------------------------------------------------------------------- Physician Orders Details Patient Name: Adam Merritt Date of Service: 07/12/2020 1:45 PM Medical Record Number: [id number] Patient Account Number: [id number] Date of Birth/Sex: Jun 19, 1945 (74 y.o. M) Treating RN: Carlene Coria Primary Care Provider: [name] Other Clinician: Referring Provider: [name] Treating Provider/Extender: Jeri Cos [name]: 7  Verbal / Phone Orders: No Diagnosis  Coding ICD-10 Coding Code Description I89.0 Lymphedema, not elsewhere classified I87.2 Venous insufficiency (chronic) (peripheral) L97.822 Non-pressure chronic ulcer of other part of left lower leg with fat layer exposed Follow-up Appointments o Return Appointment in 1 week. Edema Control - Lymphedema / Segmental Compressive Device / Other o Tubigrip single layer applied. - size F , left leg under farrow wrap , on in the am off in the pm o Patient to wear own Velcro compression garment. Remove compression stockings every night before going to bed and put on every morning when getting up. - apply tubi grip size F underneath o Elevate legs to the level of the heart and pump ankles as often as possible o Elevate leg(s) parallel to the floor when sitting. Wound Treatment Wound #39 - Lower Leg Wound Laterality: Right, Lateral Cleanser: Soap and Water 2 x Per Week/30 Days Discharge Instructions: Gently cleanse wound with antibacterial soap, rinse and pat dry prior to dressing wounds Cleanser: Normal Saline (Generic) 2 x Per Week/30 Days Discharge Instructions: Wash your hands with soap and water. Remove old dressing, discard into plastic bag and place into trash. Cleanse the wound with Normal Saline prior to applying a clean dressing using gauze sponges, not tissues or cotton balls. Do not scrub or use excessive force. Pat dry using gauze sponges, not tissue or cotton balls. Primary Dressing: Gauze (Generic) 2 x Per Week/30 Days Discharge Instructions: As directed: dry, moistened with saline or moistened with Dakins Solution Primary Dressing: Silvercel 4 1/4x 4 1/4 (in/in) (Generic) 2 x Per Week/30 Days Discharge Instructions: Apply Silvercel 4 1/4x 4 1/4 (in/in) as instructed Secondary Dressing: ABD Pad 5x9 (in/in) (Generic) 2 x Per Week/30 Days Discharge Instructions: Cover with ABD pad Compression Wrap: Profore LF 4 Multi-Layer Compression Bandaging System (Generic) 2 x Per Week/30  Days Discharge Instructions: Apply 4 multi-layer wrap as directed. Electronic Signature(s) Signed: 07/12/2020 4:46:05 PM By: Worthy Keeler PA-C Signed: 07/14/2020 9:44:48 AM By: Carlene Coria RN Entered By: Carlene Coria on 07/12/2020 14:14:23 Adam Merritt ([id number]) -------------------------------------------------------------------------------- Problem List Details Patient Name: Adam Merritt Date of Service: 07/12/2020 1:45 PM Medical Record Number: [id number] Patient Account Number: [id number] Date of Birth/Sex: Apr 07, 1946 (74 y.o. M) Treating RN: Carlene Coria Primary Care Provider: [name] Other Clinician: Referring Provider: [name] Treating Provider/Extender: Skipper Cliche in Treatment: 7 Active Problems ICD-10 Encounter Code Description Active Date MDM Diagnosis I89.0 Lymphedema, not elsewhere classified 05/20/2020 No Yes I87.2 Venous insufficiency (chronic) (peripheral) 05/20/2020 No Yes L97.822 Non-pressure chronic ulcer of other part of left lower leg with fat layer 05/20/2020 No Yes exposed Inactive Problems Resolved Problems Electronic Signature(s) Signed: 07/12/2020 2:04:05 PM By: Worthy Keeler PA-C Entered By: Worthy Keeler on 07/12/2020 14:04:05 Adam Merritt ([id number]) -------------------------------------------------------------------------------- Progress Note Details Patient Name: Adam Merritt Date of Service: 07/12/2020 1:45 PM Medical Record Number: [id number] Patient Account Number: [id number] Date of Birth/Sex: August 19, 1945 (74 y.o. M) Treating RN: Carlene Coria Primary Care Provider: [name] Other Clinician: Referring Provider: [name] Treating Provider/Extender: Skipper Cliche in Treatment: 7 Subjective Chief Complaint Information obtained from Patient Left leg ulcers History of Present Illness (HPI) 10/18/17-He is here for initial evaluation of bilateral lower extremity ulcerations in the presence of venous insufficiency  and lymphedema. He has been seen by vascular medicine in the past, Dr. Lucky Cowboy, last seen in 2016. He does have a history of abnormal ABIs, which is to be expected given his lymphedema  and venous insufficiency. According to Epic, it appears that all attempts for arterial evaluation and/or angiography were not follow through with by patient. He does have a history of being seen in lymphedema clinic in 2018, stopped going approximately 6 months ago stating "it didn't do any good". He does not have lymphedema pumps, he does not have custom fit compression wrap/stockings. He is diabetic and his recent A1c last month was 7.6. He admits to chronic bilateral lower extremity pain, no change in pain since blister and ulceration development. He is currently being treated with Levaquin for bronchitis. He has home health and we will continue. 10/25/17-He is here in follow-up evaluation for bilateral lower extremity ulcerationssubtle he remains on Levaquin for bronchitis. Right lower extremity with no evidence of drainage or ulceration, persistent left lower extremity ulceration. He states that home health has not been out since his appointment. He went to [facility] on Tuesday, studies revealed: RIGHT ABI 0.9, TBI 0.6 LEFT ABI 1.1, TBI 0.6 with triphasic flow bilaterally. We will continue his same treatment plan. He has been educated on compression therapy and need for elevation. He will benefit from lymphedema pumps 11/01/17-He is here in follow-up evaluation for left lower extremity ulcer. The right lower extremity remains healed. He has home health services, but they have not been out to see the patient for 2-3 weeks. He states it home health physical therapy changed his dressing yesterday after therapy; he placed Ace wrap compression. We are still waiting for lymphedema pumps, reordered d/t need for company change. 11/08/17-He is here in follow-up evaluation for left lower extremity ulcer. It is  improved. Edema is significantly improved with compression therapy. We will continue with same treatment plan and he will follow-up next week. No word regarding lymphedema pumps 11/15/17-He is here in follow-up evaluation for left lower extremity ulcer. He is healed and will be discharged from wound care services. I have reached out to [facility] regarding his lymphedema pumps. They have been unable to reach the patient; the contact number they had with the patient's wife's cell phone and she has not answered any unrecognized calls. Contact should be made today, trial planned for next week; [facility] will continue to follow 11/27/17 on evaluation today patient has multiple blistered areas over the right lower extremity his left lower extremity appears to be doing okay. These blistered areas show signs of no infection which is great news. With that being said he did have some necrotic skin overlying which was mechanically debrided away with saline and gauze today without complication. Overall post debridement the wounds appear to be doing better but in general his swelling seems to be increased. This is obviously not good news. I think this is what has given rise to the blisters. 12/04/17 on evaluation today patient presents for follow-up concerning his bilateral lower extremity edema in the right lower extremity ulcers. He has been tolerating the dressing changes without complication. With that being said he has had no real issues with the wraps which is also good news. Overall I'm pleased with the progress he's been making. 12/11/17 on evaluation today patient appears to be doing rather well in regard to his right lateral lower extremity ulcer. He's been tolerating the dressing changes without complication. Fortunately there does not appear to be any evidence of infection at this time. Overall I'm pleased with the progress that is being made. Unfortunately he has been in the hospital due to  having what sounds to be a  stomach virus/flu fortunately that is starting to get better. 12/18/17 on evaluation today patient actually appears to be doing very well in regard to his bilateral lower extremities the swelling is under fairly good control his lymphedema pumps are still not up and running quite yet. With that being said he does have several areas of opening noted as far as wounds are concerned mainly over the left lower extremity. With that being said I do believe once he gets lymphedema pumps this would least help you mention some the fluid and preventing this from occurring. Hopefully that will be set up soon sleeves are Artie in place at his home he just waiting for the machine. 12/25/17 on evaluation today patient actually appears to be doing excellent in fact all of his ulcers appear to have resolved his legs appear very well. I do think he needs compression stockings we have discussed this and they are actually going to go to [place] today to elastic therapy to get this fitted for him. I think that is definitely a good thing to do. Readmission: 04/09/18 upon evaluation today this patient is seen for readmission due to bilateral lower extremity lymphedema. He has significant swelling of his extremities especially on the left although the right is also swollen he has weeping from both sides. There are no obvious open wounds at this point. Fortunately he has been doing fairly well for quite a bit of time since I last saw him. Nonetheless unfortunately this seems to have reopened and is giving quite a bit of trouble. He states this began about a week ago when he first called [place] to get in to be seen. No fevers, chills, nausea, or vomiting noted at this time. He has not been using his lymphedema pumps due to the fact that they won't fit on his leg at this point likewise is also not been using his compression for essentially the same reason. 04/16/18 upon evaluation today patient actually  appears to be doing a little better in regard to the fluid in his bilateral lower extremities. With that being said he's had three falls since I saw him last week. He also states that he's been feeling very poorly. I was concerned last week and feel like that the concern is still there as far as the congestion in his chest is concerned he seems to be breathing about the same as last week but again he states he's very weak he's not even able to walk further than from the chair to the door. His wife had to buy a wheelchair just to be able to get them out of the house to get to the appointment today. This has me very concerned. Adam, Merritt ([id number]) 04/23/18 on evaluation today patient actually appears to be doing much better than last week's evaluation. At that time actually had to transport him to the ER via EMS and he subsequently was admitted for acute pulmonary edema, acute renal failure, and acute congestive heart failure. Fortunately he is doing much better. Apparently they did dialyze him and were able to take off roughly 35 pounds of fluid. Nonetheless he is feeling much better both in regard to his breathing and he's able to get around much better at this time compared to previous. Overall I'm very happy with how things are at this time. There does not appear to be any evidence of infection currently. No fevers, chills, nausea, or vomiting noted at this time. 04/30/2018 patient seen today for follow-up and management of  bilateral lower extremity lymphedema. He did express being more sad today than usual due to the recent loss of his dog. He states that he has been compliant with using the lymphedema pumps. However he does admit a minute over the last 2-3 days he has not been using the pumps due to the recent loss of his dog. At this time there is no drainage or open wounds to his lower extremities. The left leg edema is measuring smaller today. Still has a significant amount of edema on  bilateral lower extremities With dry flaky skin. He will be referred to the lymphedema clinic for further management. Will continue 3 layer compression wraps and follow-up in 1-2 weeks.Denies any pain, fever, chairs recently. No recent falls or injuries reported during this visit. 05/07/18 on evaluation today patient actually appears to be doing very well in regard to his lower extremities in general all things considered. With that being said he is having some pain in the legs just due to the amount of swelling. He does have an area where he had a blister on the left lateral lower extremity this is open at this point other than that there's nothing else weeping at this time. 05/14/18 on evaluation today patient actually appears to be doing excellent all things considered in regard to his lower extremities. He still has a couple areas of weeping on each leg which has continued to be the issue for him. He does have an appointment with the lymphedema clinic although this isn't until February 2020. That was the earliest they had. In the meantime he has continued to tolerate the compression wraps without complication. 05/28/18 on evaluation today patient actually appears to be doing more poorly in regard to his left lower extremity where he has a wound open at this point. He also had a fall where he subsequently injured his right great toe which has led to an open wound at the site unfortunately. He has been tolerating the dressing changes without complication in general as far as the wraps are concerned that he has not been putting any dressing on the left 1st toe ulcer site. 06/11/18 on evaluation today patient appears to be doing much worse in regard to his bilateral lower extremity ulcers. He has been tolerating the dressing changes without complication although his legs have not been wrapped more recently. Overall I am not very pleased with the way his legs appear. I do believe he needs to be back in  compression wraps he still has not received his compression wraps from the [facility] hospital as of yet. 06/18/18 on evaluation today patient actually appears to be doing significantly better than last time I saw him. He has been tolerating the compression wraps without complication in the circumferential ulcers especially appear to be doing much better. His toe ulcer on the right in regard to the great toe is better although not as good as the legs in my pinion. No fevers chills noted 07/02/18 on evaluation today patient appears to be doing much better in regard to his lower extremity ulcers. Unfortunately since I last saw him he's had the distal portion of his right great toe if he dated it sounds as if this actually went downhill very quickly. I had only seen him a few days prior and the toe did not appear to be infected at that point subsequently became infected very rapidly and it was decided by the surgeon that the distal portion of the toe needed to be removed. The  patient seems to be doing well in this regard he tells me. With that being said his lower extremities are doing better from the standpoint of the wounds although he is significantly swollen at this point. 07/09/18 on evaluation today patient appears to be doing better in regard to the wounds on his lower extremities. In fact everything is almost completely healed he is just a small area on the left posterior lower extremity that is open at this point. 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 With that being said he needs some kind of compression it does sound like he may not be wearing his compression, that is the wraps, during the entire time between when he's here visit to visit. Apparently his wife took the current one off because it began to "fall  apart". 07/16/18 on evaluation today patient appears to be extremely swollen especially in regard to his left lower extremity unfortunately. He also has a new skin tear over the left lower extremity and there's a smaller area on the right lower extremity as well. Unfortunately this seems to be due in part to blistering and fluid buildup in his leg. He did get the reduction wraps that were ordered by the [facility] hospital for him to go to lymphedema clinic. With that being said his wounds on the legs have not healed to the point to where they would likely accept them as a patient lymphedema clinic currently. We need to try to get this to heal. With that being said he's been taking his wraps off which is not doing him any favors at this point. In fact this is probably quite counterproductive compared to what needs to occur. We will likely need to increase to a four layer compression wrap and continue to also utilize elevation and he has to keep the wraps on not take them off as he's been doing currently hasn't had a wrap on since Saturday. 07/23/18 on evaluation today patient appears to be doing much better in regard to his bilateral lower extremities. In fact his left lower extremity which was the largest is actually 15 cm smaller today compared to what it was last time he was here in our clinic. This is obviously good news after just one week. Nonetheless the differences he actually kept the wraps on during the entire week this time. That's not typical for him. I do believe he understands a little bit better now the severity of the situation and why it's important for him to keep these wraps on. 07/30/18 on evaluation today patient actually appears to be doing rather well in regard to his lower extremities. His legs are much smaller than they have been in the past and he actually has only one very small rudder superficial region remaining that is not closed on the left lateral/posterior lower extremity even  this is almost completely close. I do believe likely next week he will be healed without any complications. I do think we need to continue the wraps however this seems to be beneficial for him. I also think it may be a good time for [place] to go ahead and see about getting the appointment with the lymphedema clinic which is supposed to be made for him in order to keep this moving along and hopefully get them into compression wraps that will in the  end help him to remain healed. 08/06/18 on evaluation today patient actually appears to be doing very well in regard as bilateral lower extremities. In fact his wounds appear to be completely healed at this time. 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 He is in the process of getting appointment with the lymphedema clinic we have made this referral were just waiting to hear back on the schedule time. We need to follow up on that today as well. 08/13/18 on evaluation today patient actually appears to be doing very well in regard to his bilateral lower extremities there are no open wounds at this point. We are gonna go ahead and see about ordering the Velcro compression wraps for him had a discussion with them about [place] doing it versus the [place] they feel like they can definitely afford going ahead and get the wraps themselves and they would prefer to try to avoid having to go to the lymphedema clinic if it all possible which I completely understand. As long as he has good compression I'm okay either way. Adam, Merritt ([id number]) 3/17//20 on evaluation today patient actually appears to be doing well in regard to his bilateral lower extremity ulcers. He has been tolerating the dressing changes without complication specifically the compression wraps. Overall is had no issues my fingers finding that I see at this point is that he is having trouble with constipation. He tells me he has not been able to go to  the bathroom for about six days. He's taken to over-the- counter oral laxatives unfortunately this is not helping. He has not contacted his doctor. 08/27/18 on evaluation today patient appears to be doing fairly well in regard to his lower extremities at this point. There does not appear to be any new altars is swelling is very well controlled. We are still waiting for his Velcro compression wraps to arrive that should be sometime in the next week is Artie sent the check for these. 09/03/18 on evaluation today patient appears to be doing excellent in regard to his bilateral lower extremities which he shows no signs of wound openings in regard to this point. He does have his Velcro compression wraps which did arrive in the mail since I saw him last week. Overall he is doing excellent in my pinion. 09/13/18 on evaluation today patient appears to be doing very well currently in regard to the overall appearance of his bilateral lower extremities although he's a little bit more swollen than last time we saw him. At that point he been discharged without any open wounds. Nonetheless he has a small open wound on the posterior left lower extremity with some evidence of cellulitis noted as well. Fortunately I feel like he has made good progress overall with regard to his lower extremities from were things used to be. 09/20/18 on evaluation today patient actually appears to be doing much better. The erythematous lower extremity is improving wound itself which is still open appears to be doing much better as far as but appearance as well as pain is concerned overall very pleased in this regard. There's no signs of active infection at this time. 09/27/18 on evaluation today patient's wounds on the lower extremity actually appear to be doing fairly well at this time which is good news. There is no evidence of active infection currently and again is just as left lower extremity were there any wounds at this point  anyway. I believe they may  be completely healed but again I'm not 100% sure based on evaluation today. I think one more week of observation would likely be a good idea. 10/04/18 on evaluation today patient actually appears to be doing excellent in regard to the left lower extremity which actually appears to be completely healed as of today. Unfortunately he's been having issues with his right lower extremity have a new wound that has opened. Fortunately there's no evidence of active infection at this time which is good news. 10/10/18 upon evaluation today patient actually appears to be doing a little bit worse with the new open area on his right posterior lower extremity. He's been tolerating the dressing changes without complication. Right now we been using his compression wraps although I think we may need to switch back to actually performing bilateral compression wraps are in the clinic. No fevers, chills, nausea, or vomiting noted at this time. 10/17/18 on evaluation today patient actually appears to be doing quite well in regard to his bilateral lower extremity ulcers. He has been tolerating the reps without complication although he would prefer not to rewrap his legs as of today. Fortunately there's no signs of active infection which is good news. No fevers, chills, nausea, or vomiting noted at this time. 10/24/18 on evaluation today patient appears to be doing very well in regard to his lower extremities. His right lower extremity is shown signs of healing and his left lower Trinity though not healed appears to be improving which is excellent news. Overall very pleased with how things seem to be progressing at this time. The patient is likewise happy to hear this. His Velcro compression wraps however have not been put on properly will gonna show his wife how to do that properly today. 10/31/18 on evaluation today patient appears to be doing more poorly in regard to his left lower extremity in  particular although both lower extremities actually are showing some signs of being worse than my previous evaluation. Unfortunately I'm just not sure that his compression stockings even with the use of the compression/lymphedema pumps seem to be controlling this well. Upon further questioning he tells me that he also is not able to lie flat in the bed due to his congestive heart failure and difficulty breathing. For that reason he sleeps in his recliner. To make matters worse his recliner also cannot even hold his legs up so instead of even being somewhat elevated they pretty much hang down to the floor. This is the way he sleeps each night which is definitely counterproductive to everything else that were attempting to do from the standpoint of controlling his fluid. Nonetheless I think that he potentially could benefit from a hospital bed although this would be something that his primary care provider would likely have to order since anything that is order on our side has to be directly related to wound care and again the hospital bed is not necessarily a direct relation to although I think it does contribute to his overall wound status on his lower extremities. 11/07/18 on evaluation today patient appears to be doing better in regard to his bilateral lower extremity. He's been tolerating the dressing changes without complication. We did in the interim since I last saw him switch to just using extras orbiting new alginate and that seems to have done much better for him. I'm very pleased with the overall progress that is made. 11/14/18 on evaluation today patient appears to be redoing rather well in regard to his left  lower extremity ulcers which are the only ones remaining at this point. Fortunately there's no signs of active infection at this time which is good news. Overall been very pleased with how things seem to be progressing currently. No fevers, chills, nausea, or vomiting noted at this  time. 11/20/17 on evaluation today patient actually appears to be doing quite well in regard to his lower extremities on the left on the right he has several blisters that showed up although there's some question about whether or not he has had his broker compression wraps on like he was supposed to or not. Fortunately there's no signs of active infection at this time which is good news. Unfortunately though he is doing well from the standpoint of the left leg the right leg is not doing as well again this is when we did not wrap last week. 11/28/18 on evaluation today patient appears to be doing well in regard to his bilateral lower extremities is left appears to be healed is right is not healed but is very close to being so. Overall very pleased with how things seem to be progressing. Patient is likewise happy that things are doing well. 12/09/18 on evaluation today patient actually appears to be completely healed which is excellent news. He actually seems to be doing well in regard to the swelling of the bilateral lower extremities which is also great news. Overall very pleased with how things seem to be progressing. Readmission: 03/18/2019 upon evaluation today patient presents for reevaluation concerning issues that he is having with his left lower extremity where he does have a wound noted upon inspection today. He also has a wound on the right second toe on the tip where it is apparently been rubbing in his shoe I did look at issues and you can actually see where this has been occurring as well. Fortunately there is no signs of infection with regard to Adam, Merritt. ([id number]) the toe necessarily although it is very swollen compared to normal that does have me somewhat concerned about the possibility of further evaluation for infection/osteomyelitis. I would recommend an x-ray to start with. Otherwise he states that it is been 1-2 weeks that he has had the draining in regard to left lower  extremity he does not know how this happened he has been wearing his compression appropriately and I do see that as well today but nonetheless I do think that he needs to continue to be very cautious with regard to elevation as well. 03/25/2019 on evaluation today patient appears to be doing much better in regard to his lower extremity at this time. That is on the left. He did culture positive for Pseudomonas but the good news is he seems to be doing much better the gentamicin we applied topically seems to have done a great job for him. There is no signs of active infection at this time systemically and locally he is doing much better. No fevers, chills, nausea, vomiting, or diarrhea. In regard to his right toe this seems to be doing much better there is very little pressure noted at this point I did clean up some of the callus today around the edges of the wound as well as the surface of the wound but to be honest he is progressing quite nicely. 10/30; the area on the left anterior lower tibia area is healed over. His edema control is adequate even though his use of the compression pumps seems very intermittent. He has a juxta lite stocking  on the right leg and a think there is 1 for the left leg at home. His wife is putting these on. He has an area on the plantar right second toe which is a hammertoe they are trying to offload this 04/08/2019 on evaluation today patient appears to be doing well in regard to his lower extremities which is good news. We are seeing him today for his toe ulcer which is giving him a little bit more trouble but again still seems to be doing better at this point which is good news. He is going require some sharp debridement in regard to the toe today however. 04/15/2019 on evaluation today patient actually appears to be doing quite well with regard to his toe ulcer. I am very pleased with how things are doing in that regard. With regard to his lower extremity edema in  general he tells me he is not been using the lymphedema pumps regularly although he does not use them. I think that he needs to use them more regularly he does have a small blister on the left lower extremity this is not open at this point but obviously it something that can get worse if he does not keep the compression going and the pumps going as well. 04/22/2019 on evaluation today patient appears to be doing well with regard to his toe ulcer. He has been tolerating the dressing changes without complication. This is measuring slightly smaller this week as compared to last week. Fortunately there is no signs of active infection at this time. 05/06/2019 on evaluation today patient actually appears to be doing excellent. In fact his toe appears to be completely healed which is great news. There is no signs of active infection at this time. Readmission: Patient presents today for follow-up after having been discharged at the beginning of December 2020. He states that in recent weeks things have reopened and has been having more trouble at this point. With that being said fortunately there is no signs of active infection at this time. No fever chills noted. Nonetheless he also does have an issue with one of his toes as well that seems to be somewhat this is on the right foot second toe. 07/25/2019 upon evaluation today patient's lower extremities appear to be doing excellent bilaterally. I really feel like he is showing signs of great improvement and in fact I think he is close to healing which is great news. There is no signs of active infection at this time. No fevers, chills, nausea, vomiting, or diarrhea. 07/31/2019 upon evaluation today patient appears to be doing excellent and in fact I think may be completely healed in regard to his right lower extremity that were still to monitor this 1 more week before closing it out. The left lower extremity is measuring smaller although not completely closed  seems to be doing excellent. 08/07/2019 upon evaluation today patient appears to be doing excellent in regard to his wounds. In fact the right lower extremity is completely healed there is no issues here. The left lower extremity has 1 very small area still remaining fortunately there is no signs of infection and overall I think he is very close to closure of the here as well. 08/14/19 upon evaluation today patient appears to be doing excellent in regard to his lower extremities. In fact he appears to be completely healed as of today. Fortunately there's no signs of active infection at this time. No fevers, chills, nausea, or vomiting noted at this time. READMISSION  09/26/2019 This is a 75 year old man who is well-known to this clinic having just been discharged on 08/14/2019. He has known lymphedema and chronic venous insufficiency. He has Farrow wrap stockings and also external compression pumps. He does not use the external compression pumps however. He does however apparently fairly faithfully use the stockings. They have not been lotion in his legs. He has developed new wounds on the right anterior as well as the left medial left lateral and left posterior calf. He returns for our review of this Past medical history includes coronary artery disease, hypertension, congestive heart failure, COPD, venous insufficiency with lymphedema, type 2 diabetes. He apparently has compression pumps, Farrow wraps and at 1 point a hospital bed although I believe he sleeps in a recliner 10/03/2019 upon evaluation today patient appears to be doing better with regard to his wounds in general. He has been tolerating the dressing changes without complication. Fortunately there is no signs of active infection. No fevers, chills, nausea, vomiting, or diarrhea. I do believe the compression wraps are helping as they always have in the past. 10/10/2019 upon evaluation today patient appears to be doing excellent at this point.  His right lower extremity is measuring much better and in fact is completely healed. His left lower extremity is also measuring better though not healed seems to be doing excellent at this point which is great news. Overall I am very pleased with how things appear currently. 10/27/2019 upon evaluation today patient appears to be doing quite well with regard to his wounds currently. He has been tolerating the dressing changes without complication. Fortunately there is no signs of active infection at this time. In fact he is almost completely healed on the left and has just a very small area open and on the right he is completely healed. 11/04/2019 upon evaluation today patient appears to be doing quite well with regard to his wounds. In fact he appears to be quite possibly completely healed on the left lower extremity now as well the right lower extremity is still doing well. With that being said unfortunately though this may be healed I think it is a very fragile area and I cannot even confirm there is not a very small opening still remaining. I really think he may benefit from 1 additional weeks wrapping before discontinuing. Adam, Merritt ([id number]) 11/10/2019 upon evaluation today patient appears to be doing well in regard to his original wounds in fact everything that we were treating last week is completely healed on the left. Unfortunately he has a new skin tear just above where the wrap slipped down due to a fall he sustained causing this injury. 11/17/2019 on evaluation today patient appears to be doing better with regard to his wound. He has been tolerating the dressing changes without complication. Fortunately there is no signs of active infection at this time. No fevers, chills, nausea, vomiting, or diarrhea. 11/24/2019 upon evaluation today patient appears to be doing well with regard to his wound. This is making good progress there does not appear to be signs of active infection but overall  I do feel like he is headed in the correct direction. Overall he is tolerating the compression wrap as well. 12/02/2019 upon evaluation today patient appears to be doing well with regard to his leg ulcer. In fact this appears to be completely healed and this is the last of the wounds that he had but still the left anterior lower extremity. Fortunately there is no signs of active  infection at this time. No fevers, chills, nausea, vomiting, or diarrhea. Readmission: 02/05/2020 on evaluation today patient appears to be doing well with regard to his wound all things considered. He actually tells me that a couple weeks ago he had trouble with his wraps and therefore took him off for a couple of days in order to give his legs a break. It was during this time that he actually developed increased swelling and edema of his legs and blisters on both legs. That is when he called to make the appointment. Nonetheless since that time he began wearing his compression wraps which are Velcro in the meantime and has done extremely well with this. In fact his leg appears to be under good control as far as edema is concerned today it is nothing like what I am seeing when he has come in for evaluations in the past. They have been using some of the silver alginate at home which has been beneficial for him. Fortunately there is no signs of active infection at this time. No fevers, chills, nausea, vomiting, or diarrhea. 02/19/2020 on evaluation today patient unfortunately does not appear to be doing quite as well as I would like to see. He has no signs of active infection at this time but unfortunately he is not doing well in regard to the overall appearance of his left leg. He has a couple other areas that are weeping now and the leg is much more swollen. I think that he needs to actually have a compression wrap to try to manage this. 02/26/2020 on evaluation today patient appears to be doing well with regard to his left lower  extremity ulcer. Fortunately the swelling is down I do believe the pressure wrap seems to be doing quite well. Fortunately there is no signs of active infection at this time. 03/05/2020 upon evaluation today patient appears to be doing excellent in regard to his legs at this point. Fortunately there is no signs of active infection which is great news. Overall he feels like he is completely healed which is great news. Readmission: 05/20/2020 upon evaluation today patient appears to be doing well with regard to his leg ulcer all things considered. He is actually coming back to see [place] after having had a reopening of the ulcers on his left leg in the past several weeks. He is out of dressing supplies and his wife is been try to take care of this as best she can. 06/10/2020 upon evaluation today patient unfortunately appears to be doing significantly worse today compared to when I last saw him. He and his wife both tell me that "there has been a lot going on". I did not actually go into detail about everything that has been happening but nonetheless he has not obviously been wearing his compression. He brings them with him today but they were not on. I am not even certain to be honest that he could get those on at this point. Nonetheless I do believe that he is going to require compression wrapping at some point shortly but right now we need to try to get what appears to be infection under control at this time. 06/28/2020 upon evaluation today patient appears to be doing well with regard to 06/28/2020 upon evaluation today patient appears to be doing well pretty well in regard to his left leg which is much better than previous. With that being said in regard to his right leg he does seem to be having some issues with a  new wound after he sustained a fall. This fortunately is not too deep but does appear to be something we need to address. Neither deep which is good. 07/05/2020 on evaluation today patient  appears to be doing well with regard to his wounds. In fact everything on the left leg appears to be pretty much healed on the right leg this is very close though not completely closed yet. Fortunately there is no evidence of active infection at this time. No fevers, chills, nausea, vomiting, or diarrhea. 07/13/2019 upon evaluation today patient appears to be making good progress which is great news. Overall I am extremely pleased with where things stand today. There does not appear to be any signs of active infection which is great news and overall his left leg appears healed still the right leg though not healed does seem to be making good progress which is excellent news. He is having no pain. Objective Constitutional Well-nourished and well-hydrated in no acute distress. Vitals Time Taken: 1:48 PM, Height: 66 in, Weight: 288 lbs, BMI: 46.5, Temperature: 98.3 F, Pulse: 65 bpm, Respiratory Rate: 20 breaths/min, Blood Pressure: 143/72 mmHg. Respiratory normal breathing without difficulty. Adam, Merritt ([id number]) Psychiatric this patient is able to make decisions and demonstrates good insight into disease process. Alert and Oriented x 3. pleasant and cooperative. General Notes: Of active infection which is great news and overall very pleased with where things stand. In general I think he is continuing to make upon inspection patient's wound bed actually showed signs of good granulation and epithelization. There does not appear to be any signs good progress towards complete closure. Integumentary (Hair, Skin) Wound #39 status is Open. Original cause of wound was Skin Tear/Laceration. The wound is located on the Right,Lateral Lower Leg. The wound measures 1cm length x 1.3cm width x 0.1cm depth; 1.021cm^2 area and 0.102cm^3 volume. There is no tunneling or undermining noted. There is a small amount of serous drainage noted. The wound margin is flat and intact. There is large (67-100%) red,  pink granulation within the wound bed. There is no necrotic tissue within the wound bed. Assessment Active Problems ICD-10 Lymphedema, not elsewhere classified Venous insufficiency (chronic) (peripheral) Non-pressure chronic ulcer of other part of left lower leg with fat layer exposed Procedures Wound #39 Pre-procedure diagnosis of Wound #39 is a Lymphedema located on the Right,Lateral Lower Leg . There was a Four Layer Compression Therapy Procedure by Carlene Coria, RN. Post procedure Diagnosis Wound #39: Same as Pre-Procedure Plan Follow-up Appointments: Return Appointment in 1 week. Edema Control - Lymphedema / Segmental Compressive Device / Other: Tubigrip single layer applied. - size F , left leg under farrow wrap , on in the am off in the pm Patient to wear own Velcro compression garment. Remove compression stockings every night before going to bed and put on every morning when getting up. - apply tubi grip size F underneath Elevate legs to the level of the heart and pump ankles as often as possible Elevate leg(s) parallel to the floor when sitting. WOUND #39: - Lower Leg Wound Laterality: Right, Lateral Cleanser: Soap and Water 2 x Per Week/30 Days Discharge Instructions: Gently cleanse wound with antibacterial soap, rinse and pat dry prior to dressing wounds Cleanser: Normal Saline (Generic) 2 x Per Week/30 Days Discharge Instructions: Wash your hands with soap and water. Remove old dressing, discard into plastic bag and place into trash. Cleanse the wound with Normal Saline prior to applying a clean dressing using gauze sponges, not tissues  or cotton balls. Do not scrub or use excessive force. Pat dry using gauze sponges, not tissue or cotton balls. Primary Dressing: Gauze (Generic) 2 x Per Week/30 Days Discharge Instructions: As directed: dry, moistened with saline or moistened with Dakins Solution Primary Dressing: Silvercel 4 1/4x 4 1/4 (in/in) (Generic) 2 x Per Week/30  Days Discharge Instructions: Apply Silvercel 4 1/4x 4 1/4 (in/in) as instructed Secondary Dressing: ABD Pad 5x9 (in/in) (Generic) 2 x Per Week/30 Days Discharge Instructions: Cover with ABD pad Compression Wrap: Profore LF 4 Multi-Layer Compression Bandaging System (Generic) 2 x Per Week/30 Days Discharge Instructions: Apply 4 multi-layer wrap as directed. 1. Would recommend that we go ahead and continue with the wound care measures as before in regard to the right leg this is a silver alginate dressing followed by 4-layer compression wrap. TIYON, SANOR ([id number]) 10. Is going to use his Velcro compression wrap on the left leg. 3. I am also can recommend the patient continue to elevate his legs much as possible try to keep edema under good control. We will see patient back for reevaluation in 1 week here in the clinic. If anything worsens or changes patient will contact our office for additional recommendations. Electronic Signature(s) Signed: 07/12/2020 4:34:09 PM By: Worthy Keeler PA-C Entered By: Worthy Keeler on 07/12/2020 16:34:09 Adam Merritt ([id number]) -------------------------------------------------------------------------------- SuperBill Details Patient Name: Adam Merritt Date of Service: 07/12/2020 Medical Record Number: [id number] Patient Account Number: [id number] Date of Birth/Sex: 1946-04-14 (75 y.o. M) Treating RN: Carlene Coria Primary Care Provider: [name] Other Clinician: Referring Provider: [name] Treating Provider/Extender: Skipper Cliche in Treatment: 7 Diagnosis Coding ICD-10 Codes Code Description I89.0 Lymphedema, not elsewhere classified I87.2 Venous insufficiency (chronic) (peripheral) L97.822 Non-pressure chronic ulcer of other part of left lower leg with fat layer exposed Facility Procedures CPT4 Code: [id number] Description: (Facility Use Only) [phone] - APPLY MULTLAY COMPRS LWR RT LEG Modifier: Quantity: 1 Physician  Procedures CPT4 Code: [id number] Description: 38329 - WC PHYS LEVEL 3 - EST PT Modifier: Quantity: 1 CPT4 Code: Description: ICD-10 Diagnosis Description I89.0 Lymphedema, not elsewhere classified I87.2 Venous insufficiency (chronic) (peripheral) L97.822 Non-pressure chronic ulcer of other part of left lower leg with fat lay Modifier: er exposed Quantity: Electronic Signature(s) Signed: 07/12/2020 4:34:34 PM By: Worthy Keeler PA-C Entered By: Worthy Keeler on 07/12/2020 16:34:33

## 2020-07-14 NOTE — Progress Notes (Signed)
Adam Merritt, Adam Merritt ([id number]) Visit Report for 07/12/2020 Arrival Information Details Patient Name: Adam Merritt, Adam Merritt Date of Service: 07/12/2020 1:45 PM Medical Record Number: [id number] Patient Account Number: [id number] Date of Birth/Sex: 12/30/1945 (75 y.o. M) Treating RN: Dolan Amen Primary Care Issac Moure: [name] Other Clinician: Referring Shawntay Prest: [name] Treating Conita Amenta/Extender: Skipper Cliche in Treatment: 7 Visit Information History Since Last Visit Pain Present Now: Yes Patient Arrived: Wheel Chair Arrival Time: 13:48 Accompanied By: wife Transfer Assistance: None Patient Identification Verified: Yes Secondary Verification Process Completed: Yes Electronic Signature(s) Signed: 07/12/2020 3:35:02 PM By: Georges Mouse, [name] Breeding RN Entered By: Georges Mouse, [name] Breeding on 07/12/2020 13:48:32 Adam Merritt ([id number]) -------------------------------------------------------------------------------- Clinic Level of Care Assessment Details Patient Name: Adam Merritt Date of Service: 07/12/2020 1:45 PM Medical Record Number: [id number] Patient Account Number: [id number] Date of Birth/Sex: Jun 22, 1945 (74 y.o. M) Treating RN: Carlene Coria Primary Care Yair Dusza: [name] Other Clinician: Referring Elinor Kleine: [name] Treating Nakeem Murnane/Extender: Skipper Cliche in Treatment: 7 Clinic Level of Care Assessment Items TOOL 1 Quantity Score []  - Use when EandM and Procedure is performed on INITIAL visit 0 ASSESSMENTS - Nursing Assessment / Reassessment []  - General Physical Exam (combine w/ comprehensive assessment (listed just below) when performed on new 0 pt. evals) []  - 0 Comprehensive Assessment (HX, ROS, Risk Assessments, Wounds Hx, etc.) ASSESSMENTS - Wound and Skin Assessment / Reassessment []  - Dermatologic / Skin Assessment (not related to wound area) 0 ASSESSMENTS - Ostomy and/or Continence Assessment and Care []  - Incontinence Assessment and Management  0 []  - 0 Ostomy Care Assessment and Management (repouching, etc.) PROCESS - Coordination of Care []  - Simple Patient / Family Education for ongoing care 0 []  - 0 Complex (extensive) Patient / Family Education for ongoing care []  - 0 Staff obtains Programmer, systems, Records, Test Results / Process Orders []  - 0 Staff telephones HHA, Nursing Homes / Clarify orders / etc []  - 0 Routine Transfer to another Facility (non-emergent condition) []  - 0 Routine Hospital Admission (non-emergent condition) []  - 0 New Admissions / Biomedical engineer / Ordering NPWT, Apligraf, etc. []  - 0 Emergency Hospital Admission (emergent condition) PROCESS - Special Needs []  - Pediatric / Minor Patient Management 0 []  - 0 Isolation Patient Management []  - 0 Hearing / Language / Visual special needs []  - 0 Assessment of Community assistance (transportation, D/C planning, etc.) []  - 0 Additional assistance / Altered mentation []  - 0 Support Surface(s) Assessment (bed, cushion, seat, etc.) INTERVENTIONS - Miscellaneous []  - External ear exam 0 []  - 0 Patient Transfer (multiple staff / Civil Service fast streamer / Similar devices) []  - 0 Simple Staple / Suture removal (25 or less) []  - 0 Complex Staple / Suture removal (26 or more) []  - 0 Hypo/Hyperglycemic Management (do not check if billed separately) []  - 0 Ankle / Brachial Index (ABI) - do not check if billed separately Has the patient been seen at the hospital within the last three years: Yes Total Score: 0 Level Of Care: ____ Adam Merritt ([id number]) Electronic Signature(s) Signed: 07/14/2020 9:44:48 AM By: Carlene Coria RN Entered By: Carlene Coria on 07/12/2020 Adam Merritt, Adam W. ([id number]) -------------------------------------------------------------------------------- Compression Therapy Details Patient Name: Adam Merritt Date of Service: 07/12/2020 1:45 PM Medical Record Number: [id number] Patient Account Number: [id number] Date of Birth/Sex:  Nov 11, 1945 (74 y.o. M) Treating RN: Carlene Coria Primary Care Curtez Brallier: [name] Other Clinician: Referring Avalynn Bowe: [name] Treating Benjiman Sedgwick/Extender: Jeri Cos [name]: 7 Compression Therapy Performed for  Wound Assessment: Wound #39 Right,Lateral Lower Leg Performed By: Clinician Carlene Coria, RN Compression Type: Four Layer Post Procedure Diagnosis Same as Pre-procedure Electronic Signature(s) Signed: 07/14/2020 9:44:48 AM By: Carlene Coria RN Entered By: Carlene Coria on 07/12/2020 14:15:34 Adam Merritt ([id number]) -------------------------------------------------------------------------------- Encounter Discharge Information Details Patient Name: Adam Merritt Date of Service: 07/12/2020 1:45 PM Medical Record Number: [id number] Patient Account Number: [id number] Date of Birth/Sex: 1945-11-11 (74 y.o. M) Treating RN: Cornell Barman Primary Care Emara Lichter: [name] Other Clinician: Referring Torianne Laflam: [name] Treating Thorvald Orsino/Extender: Skipper Cliche in Treatment: 7 Encounter Discharge Information Items Discharge Condition: Stable Ambulatory Status: Wheelchair Discharge Destination: Home Transportation: Private Auto Accompanied By: self Schedule Follow-up Appointment: Yes Clinical Summary of Care: Electronic Signature(s) Signed: 07/13/2020 9:03:26 PM By: Gretta Cool, BSN, RN, CWS, Kim RN, BSN Entered By: Gretta Cool, BSN, RN, CWS, Kim on 07/12/2020 14:34:11 Adam Merritt ([id number]) -------------------------------------------------------------------------------- Lower Extremity Assessment Details Patient Name: Adam Merritt Date of Service: 07/12/2020 1:45 PM Medical Record Number: [id number] Patient Account Number: [id number] Date of Birth/Sex: [birth year] (74 y.o. M) Treating RN: Dolan Amen Primary Care Aliene Tamura: [name] Other Clinician: Referring Anneliese Leblond: [name] Treating Markella Dao/Extender: Jeri Cos [name]: 7 Edema  Assessment Assessed: [Left: Yes] [Right: Yes] Edema: [Left: Yes] [Right: Yes] Calf Left: Right: Point of Measurement: 30 cm From Medial Instep 48 cm 42 cm Ankle Left: Right: Point of Measurement: 10 cm From Medial Instep 36.5 cm 28 cm Vascular Assessment Pulses: Dorsalis Pedis Palpable: [Left:Yes] [Right:Yes] Electronic Signature(s) Signed: 07/12/2020 3:35:02 PM By: Georges Mouse, [name] Breeding RN Entered By: Georges Mouse, [name] Breeding on 07/12/2020 14:00:48 Adam Merritt ([id number]) -------------------------------------------------------------------------------- Multi Wound Chart Details Patient Name: Adam Merritt Date of Service: 07/12/2020 1:45 PM Medical Record Number: [id number] Patient Account Number: [id number] Date of Birth/Sex: 1946-02-23 (74 y.o. M) Treating RN: Carlene Coria Primary Care Abigaile Rossie: [name] Other Clinician: Referring Adien Kimmel: [name] Treating Kerney Hopfensperger/Extender: Skipper Cliche in Treatment: 7 Vital Signs Height(in): 22 Pulse(bpm): 72 Weight(lbs): 288 Blood Pressure(mmHg): 143/72 Body Mass Index(BMI): 46 Temperature(F): 98.3 Respiratory Rate(breaths/min): 20 Photos: [N/A:N/A] Wound Location: Right, Lateral Lower Leg N/A N/A Wounding Event: Skin Tear/Laceration N/A N/A Primary Etiology: Lymphedema N/A N/A Comorbid History: Anemia, Lymphedema, Chronic N/A N/A Obstructive Pulmonary Disease (COPD), Sleep Apnea, Congestive Heart Failure, Coronary Artery Disease, Hypertension, Peripheral Venous Disease, Type II Diabetes, Osteoarthritis, Neuropathy, Received Chemotherapy Date Acquired: 06/26/2020 N/A N/A Weeks of Treatment: 2 N/A N/A Wound Status: Open N/A N/A Measurements L x W x D (cm) 1x1.3x0.1 N/A N/A Area (cm) : 1.021 N/A N/A Volume (cm) : 0.102 N/A N/A % Reduction in Area: 96.70% N/A N/A % Reduction in Volume: 96.70% N/A N/A Classification: Partial Thickness N/A N/A Exudate Amount: Small N/A N/A Exudate Type: Serous N/A N/A Exudate  Color: amber N/A N/A Wound Margin: Flat and Intact N/A N/A Granulation Amount: Large (67-100%) N/A N/A Granulation Quality: Red, Pink N/A N/A Necrotic Amount: None Present (0%) N/A N/A Exposed Structures: Fascia: No N/A N/A Fat Layer (Subcutaneous Tissue): No Tendon: No Muscle: No Joint: No Bone: No Epithelialization: Large (67-100%) N/A N/A Treatment Notes Electronic Signature(s) Signed: 07/14/2020 9:44:48 AM By: Carlene Coria RN Adam Merritt ([id number]) Entered By: Carlene Coria on 07/12/2020 Adam Merritt, Adam W. ([id number]) -------------------------------------------------------------------------------- Pierce Details Patient Name: Adam Merritt Date of Service: 07/12/2020 1:45 PM Medical Record Number: [id number] Patient Account Number: [id number] Date of Birth/Sex: 10-Jun-1945 (74 y.o. M) Treating RN: Carlene Coria Primary Care Austin Pongratz: [name] Other Clinician: Referring Jose Alleyne:  [name] Treating Renika Shiflet/Extender: Jeri Cos [name]: 7 Active Inactive Wound/Skin Impairment Nursing Diagnoses: Impaired tissue integrity Goals: Patient/caregiver will verbalize understanding of skin care regimen Date Initiated: 05/20/2020 Target Resolution Date: 07/21/2020 Goal Status: Active Interventions: Assess ulceration(s) every visit Treatment Activities: Referred to DME Jaydee Conran for dressing supplies : 05/20/2020 Notes: Electronic Signature(s) Signed: 07/14/2020 9:44:48 AM By: Carlene Coria RN Entered By: Carlene Coria on 07/12/2020 14:04:49 Adam Merritt ([id number]) -------------------------------------------------------------------------------- Pain Assessment Details Patient Name: Adam Merritt Date of Service: 07/12/2020 1:45 PM Medical Record Number: [id number] Patient Account Number: [id number] Date of Birth/Sex: Jan 18, 1946 (74 y.o. M) Treating RN: Dolan Amen Primary Care Meya Clutter: [name] Other Clinician: Referring  Lidiya Reise: [name] Treating Juline Sanderford/Extender: Skipper Cliche in Treatment: 7 Active Problems Location of Pain Severity and Description of Pain Patient Has Paino Yes Site Locations Pain Location: Generalized Pain Rate the pain. Current Pain Level: 6 Pain Management and Medication Current Pain Management: Electronic Signature(s) Signed: 07/12/2020 3:35:02 PM By: Georges Mouse, [name] Breeding RN Entered By: Georges Mouse, [name] Breeding on 07/12/2020 13:49:27 Adam Merritt ([id number]) -------------------------------------------------------------------------------- Patient/Caregiver Education Details Patient Name: Adam Merritt Date of Service: 07/12/2020 1:45 PM Medical Record Number: [id number] Patient Account Number: [id number] Date of Birth/Gender: [birth year] (74 y.o. M) Treating RN: Carlene Coria Primary Care Physician: [name] Other Clinician: Referring Physician: [name] Treating Physician/Extender: Skipper Cliche in Treatment: 7 Education Assessment Education Provided To: Patient Education Topics Provided Wound/Skin Impairment: Methods: Explain/Verbal Responses: State content correctly Electronic Signature(s) Signed: 07/14/2020 9:44:48 AM By: Carlene Coria RN Entered By: Carlene Coria on 07/12/2020 14:16:52 Adam Merritt ([id number]) -------------------------------------------------------------------------------- Wound Assessment Details Patient Name: Adam Merritt Date of Service: 07/12/2020 1:45 PM Medical Record Number: [id number] Patient Account Number: [id number] Date of Birth/Sex: 01/31/1946 (74 y.o. M) Treating RN: Dolan Amen Primary Care Indigo Barbian: [name] Other Clinician: Referring Miguelina Fore: [name] Treating Wilma Michaelson/Extender: Jeri Cos [name]: 7 Wound Status Wound Number: 39 Primary Lymphedema Etiology: Wound Location: Right, Lateral Lower Leg Wound Open Wounding Event: Skin Tear/Laceration Status: Date Acquired:  06/26/2020 Comorbid Anemia, Lymphedema, Chronic Obstructive Pulmonary Weeks Of Treatment: 2 History: Disease (COPD), Sleep Apnea, Congestive Heart Failure, Clustered Wound: No Coronary Artery Disease, Hypertension, Peripheral Venous Disease, Type II Diabetes, Osteoarthritis, Neuropathy, Received Chemotherapy Photos Wound Measurements Length: (cm) 1 % Redu Width: (cm) 1.3 % Redu Depth: (cm) 0.1 Epithe Area: (cm) 1.021 [place] Volume: (cm) 0.102 Under ction in Area: 96.7% ction in Volume: 96.7% lialization: Large (67-100%) ling: No mining: No Wound Description Classification: Partial Thickness Foul O Wound Margin: Flat and Intact Slough Exudate Amount: Small Exudate Type: Serous Exudate Color: amber dor After Cleansing: No /Fibrino No Wound Bed Granulation Amount: Large (67-100%) Exposed Structure Granulation Quality: Red, Pink Fascia Exposed: No Necrotic Amount: None Present (0%) Fat Layer (Subcutaneous Tissue) Exposed: No Tendon Exposed: No Muscle Exposed: No Joint Exposed: No Bone Exposed: No Treatment Notes Wound #39 (Lower Leg) Wound Laterality: Right, Lateral Cleanser Soap and Water Adam Merritt, Adam Merritt ([id number]) Discharge Instruction: Gently cleanse wound with antibacterial soap, rinse and pat dry prior to dressing wounds Normal Saline Discharge Instruction: Wash your hands with soap and water. Remove old dressing, discard into plastic bag and place into trash. Cleanse the wound with Normal Saline prior to applying a clean dressing using gauze sponges, not tissues or cotton balls. Do not scrub or use excessive force. Pat dry using gauze sponges, not tissue or cotton balls. Peri-Wound Care Topical Primary Dressing Gauze Discharge Instruction: As directed:  dry, moistened with saline or moistened with Dakins Solution Silvercel 4 1/4x 4 1/4 (in/in) Discharge Instruction: Apply Silvercel 4 1/4x 4 1/4 (in/in) as instructed Secondary Dressing ABD Pad 5x9  (in/in) Discharge Instruction: Cover with ABD pad Secured With Compression Wrap Profore LF [place] Discharge Instruction: Apply 4 multi-layer wrap as directed. Compression Stockings Add-Ons Electronic Signature(s) Signed: 07/12/2020 3:35:02 PM By: Georges Mouse, [name] Breeding RN Entered By: Georges Mouse, [name] Breeding on 07/12/2020 13:58:21 Adam Merritt ([id number]) -------------------------------------------------------------------------------- Vitals Details Patient Name: Adam Merritt Date of Service: 07/12/2020 1:45 PM Medical Record Number: [id number] Patient Account Number: [id number] Date of Birth/Sex: [birth year] (74 y.o. M) Treating RN: Dolan Amen Primary Care Carnie Bruemmer: [name] Other Clinician: Referring Chaquita Basques: [name] Treating Torey Regan/Extender: Skipper Cliche in Treatment: 7 Vital Signs Time Taken: 13:48 Temperature (F): 98.3 Height (in): 66 Pulse (bpm): 65 Weight (lbs): 288 Respiratory Rate (breaths/min): 20 Body Mass Index (BMI): 46.5 Blood Pressure (mmHg): 143/72 Reference Range: 80 - 120 mg / dl Electronic Signature(s) Signed: 07/12/2020 3:35:02 PM By: Georges Mouse, [name] Breeding RN Entered By: Georges Mouse, [name] Breeding on 07/12/2020 13:49:09

## 2020-07-15 DIAGNOSIS — J441 Chronic obstructive pulmonary disease with (acute) exacerbation: Secondary | ICD-10-CM | POA: Diagnosis not present

## 2020-07-15 DIAGNOSIS — N183 Chronic kidney disease, stage 3 unspecified: Secondary | ICD-10-CM | POA: Diagnosis not present

## 2020-07-15 DIAGNOSIS — E1122 Type 2 diabetes mellitus with diabetic chronic kidney disease: Secondary | ICD-10-CM | POA: Diagnosis not present

## 2020-07-15 DIAGNOSIS — S161XXD Strain of muscle, fascia and tendon at neck level, subsequent encounter: Secondary | ICD-10-CM | POA: Diagnosis not present

## 2020-07-15 DIAGNOSIS — I130 Hypertensive heart and chronic kidney disease with heart failure and stage 1 through stage 4 chronic kidney disease, or unspecified chronic kidney disease: Secondary | ICD-10-CM | POA: Diagnosis not present

## 2020-07-15 DIAGNOSIS — I5032 Chronic diastolic (congestive) heart failure: Secondary | ICD-10-CM | POA: Diagnosis not present

## 2020-07-15 DIAGNOSIS — E1142 Type 2 diabetes mellitus with diabetic polyneuropathy: Secondary | ICD-10-CM | POA: Diagnosis not present

## 2020-07-15 DIAGNOSIS — I890 Lymphedema, not elsewhere classified: Secondary | ICD-10-CM | POA: Diagnosis not present

## 2020-07-19 ENCOUNTER — Other Ambulatory Visit: Payer: Self-pay

## 2020-07-19 ENCOUNTER — Encounter: Payer: Medicare PPO | Admitting: Physician Assistant

## 2020-07-19 DIAGNOSIS — L97822 Non-pressure chronic ulcer of other part of left lower leg with fat layer exposed: Secondary | ICD-10-CM | POA: Diagnosis not present

## 2020-07-19 DIAGNOSIS — I872 Venous insufficiency (chronic) (peripheral): Secondary | ICD-10-CM | POA: Diagnosis not present

## 2020-07-19 DIAGNOSIS — E11622 Type 2 diabetes mellitus with other skin ulcer: Secondary | ICD-10-CM | POA: Diagnosis not present

## 2020-07-19 DIAGNOSIS — I509 Heart failure, unspecified: Secondary | ICD-10-CM | POA: Diagnosis not present

## 2020-07-19 DIAGNOSIS — E1140 Type 2 diabetes mellitus with diabetic neuropathy, unspecified: Secondary | ICD-10-CM | POA: Diagnosis not present

## 2020-07-19 DIAGNOSIS — I890 Lymphedema, not elsewhere classified: Secondary | ICD-10-CM | POA: Diagnosis not present

## 2020-07-19 DIAGNOSIS — I110 Hypertensive heart disease with heart failure: Secondary | ICD-10-CM | POA: Diagnosis not present

## 2020-07-19 DIAGNOSIS — E1151 Type 2 diabetes mellitus with diabetic peripheral angiopathy without gangrene: Secondary | ICD-10-CM | POA: Diagnosis not present

## 2020-07-19 NOTE — Progress Notes (Signed)
Adam Merritt, Adam Merritt ([id number]) Visit Report for 07/19/2020 Arrival Information Details Patient Name: Adam Merritt, Adam Merritt Date of Service: 07/19/2020 12:30 PM Medical Record Number: [id number] Patient Account Number: [id number] Date of Birth/Sex: May 16, 1946 (75 y.o. M) Treating RN: Carlene Coria Primary Care Fani Rotondo: [name] Other Clinician: Referring Joevon Holliman: [name] Treating Kiante Petrovich/Extender: Skipper Cliche in Treatment: 8 Visit Information History Since Last Visit All ordered tests and consults were completed: No Patient Arrived: Wheel Chair Added or deleted any medications: No Arrival Time: 12:42 Any new allergies or adverse reactions: No Accompanied By: wife Had a fall or experienced change in No Transfer Assistance: None activities of daily living that may affect Patient Identification Verified: Yes risk of falls: Secondary Verification Process Completed: Yes Signs or symptoms of abuse/neglect since last visito No Patient Requires Transmission-Based Precautions: No Hospitalized since last visit: No Patient Has Alerts: No Implantable device outside of the clinic excluding No cellular tissue based products placed in the center since last visit: Has Dressing in Place as Prescribed: Yes Pain Present Now: No Electronic Signature(s) Signed: 07/19/2020 4:08:54 PM By: Carlene Coria RN Entered By: Carlene Coria on 07/19/2020 12:43:00 Adam Merritt ([id number]) -------------------------------------------------------------------------------- Clinic Level of Care Assessment Details Patient Name: Adam Merritt Date of Service: 07/19/2020 12:30 PM Medical Record Number: [id number] Patient Account Number: [id number] Date of Birth/Sex: November 07, 1945 (74 y.o. M) Treating RN: Carlene Coria Primary Care Gaylan Fauver: [name] Other Clinician: Referring Aziel Morgan: [name] Treating Breklyn Fabrizio/Extender: Skipper Cliche in Treatment: 8 Clinic Level of Care Assessment Items TOOL 4  Quantity Score X - Use when only an EandM is performed on FOLLOW-UP visit 1 0 ASSESSMENTS - Nursing Assessment / Reassessment X - Reassessment of Co-morbidities (includes updates in patient status) 1 10 X- 1 5 Reassessment of Adherence to Treatment Plan ASSESSMENTS - Wound and Skin Assessment / Reassessment X - Simple Wound Assessment / Reassessment - one wound 1 5 []  - 0 Complex Wound Assessment / Reassessment - multiple wounds []  - 0 Dermatologic / Skin Assessment (not related to wound area) ASSESSMENTS - Focused Assessment []  - Circumferential Edema Measurements - multi extremities 0 []  - 0 Nutritional Assessment / Counseling / Intervention []  - 0 Lower Extremity Assessment (monofilament, tuning fork, pulses) []  - 0 Peripheral Arterial Disease Assessment (using hand held doppler) ASSESSMENTS - Ostomy and/or Continence Assessment and Care []  - Incontinence Assessment and Management 0 []  - 0 Ostomy Care Assessment and Management (repouching, etc.) PROCESS - Coordination of Care X - Simple Patient / Family Education for ongoing care 1 15 []  - 0 Complex (extensive) Patient / Family Education for ongoing care X- 1 10 Staff obtains Programmer, systems, Records, Test Results / Process Orders []  - 0 Staff telephones HHA, Nursing Homes / Clarify orders / etc []  - 0 Routine Transfer to another Facility (non-emergent condition) []  - 0 Routine Hospital Admission (non-emergent condition) []  - 0 New Admissions / Biomedical engineer / Ordering NPWT, Apligraf, etc. []  - 0 Emergency Hospital Admission (emergent condition) X- 1 10 Simple Discharge Coordination []  - 0 Complex (extensive) Discharge Coordination PROCESS - Special Needs []  - Pediatric / Minor Patient Management 0 []  - 0 Isolation Patient Management []  - 0 Hearing / Language / Visual special needs []  - 0 Assessment of Community assistance (transportation, D/C planning, etc.) []  - 0 Additional assistance / Altered  mentation []  - 0 Support Surface(s) Assessment (bed, cushion, seat, etc.) INTERVENTIONS - Wound Cleansing / Measurement Adam Merritt, OYSTER. ([id number]) []  - 0 Simple Wound Cleansing -  one wound []  - 0 Complex Wound Cleansing - multiple wounds X- 1 5 Wound Imaging (photographs - any number of wounds) []  - 0 Wound Tracing (instead of photographs) X- 1 5 Simple Wound Measurement - one wound []  - 0 Complex Wound Measurement - multiple wounds INTERVENTIONS - Wound Dressings []  - Small Wound Dressing one or multiple wounds 0 []  - 0 Medium Wound Dressing one or multiple wounds []  - 0 Large Wound Dressing one or multiple wounds []  - 0 Application of Medications - topical []  - 0 Application of Medications - injection INTERVENTIONS - Miscellaneous []  - External ear exam 0 []  - 0 Specimen Collection (cultures, biopsies, blood, body fluids, etc.) []  - 0 Specimen(s) / Culture(s) sent or taken to Lab for analysis []  - 0 Patient Transfer (multiple staff / Civil Service fast streamer / Similar devices) []  - 0 Simple Staple / Suture removal (25 or less) []  - 0 Complex Staple / Suture removal (26 or more) []  - 0 Hypo / Hyperglycemic Management (close monitor of Blood Glucose) []  - 0 Ankle / Brachial Index (ABI) - do not check if billed separately X- 1 5 Vital Signs Has the patient been seen at the hospital within the last three years: Yes Total Score: 70 Level Of Care: New/Established - Level 2 Electronic Signature(s) Signed: 07/19/2020 4:08:54 PM By: Carlene Coria RN Entered By: Carlene Coria on 07/19/2020 12:52:38 Adam Merritt ([id number]) -------------------------------------------------------------------------------- Encounter Discharge Information Details Patient Name: Adam Merritt Date of Service: 07/19/2020 12:30 PM Medical Record Number: [id number] Patient Account Number: [id number] Date of Birth/Sex: 10-17-1945 (74 y.o. M) Treating RN: Carlene Coria Primary Care Rolinda Impson: [name]  Other Clinician: Referring Cyle Kenyon: [name] Treating Ercilia Bettinger/Extender: Skipper Cliche in Treatment: 8 Encounter Discharge Information Items Discharge Condition: Stable Ambulatory Status: Wheelchair Discharge Destination: Home Transportation: Private Auto Accompanied By: wife Schedule Follow-up Appointment: Yes Clinical Summary of Care: Patient Declined Electronic Signature(s) Signed: 07/19/2020 4:08:54 PM By: Carlene Coria RN Entered By: Carlene Coria on 07/19/2020 12:57:51 Adam Merritt ([id number]) -------------------------------------------------------------------------------- Lower Extremity Assessment Details Patient Name: Adam Merritt Date of Service: 07/19/2020 12:30 PM Medical Record Number: [id number] Patient Account Number: [id number] Date of Birth/Sex: 1946/02/09 (74 y.o. M) Treating RN: Carlene Coria Primary Care Nori Winegar: [name] Other Clinician: Referring Alfonse Garringer: [name] Treating Nakyia Dau/Extender: Jeri Cos [name]: 8 Edema Assessment Assessed: [Left: No] [Right: No] [Left: Edema] [Right: :] Calf Left: Right: Point of Measurement: From Medial Instep 42 cm Ankle Left: Right: Point of Measurement: From Medial Instep 28 cm Electronic Signature(s) Signed: 07/19/2020 4:08:54 PM By: Carlene Coria RN Entered By: Carlene Coria on 07/19/2020 12:44:57 Adam Merritt ([id number]) -------------------------------------------------------------------------------- Multi Wound Chart Details Patient Name: Adam Merritt Date of Service: 07/19/2020 12:30 PM Medical Record Number: [id number] Patient Account Number: [id number] Date of Birth/Sex: [birth year] (74 y.o. M) Treating RN: Carlene Coria Primary Care Michaeleen Down: [name] Other Clinician: Referring Alica Shellhammer: [name] Treating Arel Tippen/Extender: Skipper Cliche in Treatment: 8 Vital Signs Height(in): 36 Pulse(bpm): 1 Weight(lbs): 288 Blood Pressure(mmHg): 147/82 Body Mass  Index(BMI): 46 Temperature(F): 98.4 Respiratory Rate(breaths/min): 18 Photos: [N/A:N/A] Wound Location: Right, Lateral Lower Leg N/A N/A Wounding Event: Skin Tear/Laceration N/A N/A Primary Etiology: Lymphedema N/A N/A Comorbid History: Anemia, Lymphedema, Chronic N/A N/A Obstructive Pulmonary Disease (COPD), Sleep Apnea, Congestive Heart Failure, Coronary Artery Disease, Hypertension, Peripheral Venous Disease, Type II Diabetes, Osteoarthritis, Neuropathy, Received Chemotherapy Date Acquired: 06/26/2020 N/A N/A Weeks of Treatment: 3 N/A N/A Wound Status: Open N/A N/A Measurements L  x W x D (cm) 0x0x0 N/A N/A Area (cm) : 0 N/A N/A Volume (cm) : 0 N/A N/A % Reduction in Area: 100.00% N/A N/A % Reduction in Volume: 100.00% N/A N/A Classification: Partial Thickness N/A N/A Exudate Amount: None Present N/A N/A Wound Margin: Flat and Intact N/A N/A Granulation Amount: None Present (0%) N/A N/A Necrotic Amount: None Present (0%) N/A N/A Exposed Structures: Fascia: No N/A N/A Fat Layer (Subcutaneous Tissue): No Tendon: No Muscle: No Joint: No Bone: No Epithelialization: Large (67-100%) N/A N/A Treatment Notes Electronic Signature(s) Signed: 07/19/2020 4:08:54 PM By: Carlene Coria RN Entered By: Carlene Coria on 07/19/2020 12:50:37 Adam Merritt ([id number]) -------------------------------------------------------------------------------- [place] Details Patient Name: Adam Merritt Date of Service: 07/19/2020 12:30 PM Medical Record Number: [id number] Patient Account Number: [id number] Date of Birth/Sex: 04-14-1946 (74 y.o. M) Treating RN: Carlene Coria Primary Care Jaelen Gellerman: [name] Other Clinician: Referring Cipriano Millikan: [name] Treating Madelynn Malson/Extender: Jeri Cos [name]: 8 Active Inactive Electronic Signature(s) Signed: 07/19/2020 4:08:54 PM By: Carlene Coria RN Entered By: Carlene Coria on 07/19/2020 12:50:13 Adam Merritt  ([id number]) -------------------------------------------------------------------------------- Pain Assessment Details Patient Name: Adam Merritt Date of Service: 07/19/2020 12:30 PM Medical Record Number: [id number] Patient Account Number: [id number] Date of Birth/Sex: 1945/07/08 (74 y.o. M) Treating RN: Carlene Coria Primary Care Hadlee Burback: [name] Other Clinician: Referring Kamiyah Kindel: [name] Treating Namon Villarin/Extender: Skipper Cliche in Treatment: 8 Active Problems Location of Pain Severity and Description of Pain Patient Has Paino No Site Locations Pain Management and Medication Current Pain Management: Electronic Signature(s) Signed: 07/19/2020 4:08:54 PM By: Carlene Coria RN Entered By: Carlene Coria on 07/19/2020 12:43:35 Adam Merritt ([id number]) -------------------------------------------------------------------------------- Patient/Caregiver Education Details Patient Name: Adam Merritt Date of Service: 07/19/2020 12:30 PM Medical Record Number: [id number] Patient Account Number: [id number] Date of Birth/Gender: 1945/06/30 (74 y.o. M) Treating RN: Carlene Coria Primary Care Physician: [name] Other Clinician: Referring Physician: [name] Treating Physician/Extender: Skipper Cliche in Treatment: 8 Education Assessment Education Provided To: Patient Education Topics Provided Wound/Skin Impairment: Methods: Explain/Verbal Responses: State content correctly Electronic Signature(s) Signed: 07/19/2020 4:08:54 PM By: Carlene Coria RN Entered By: Carlene Coria on 07/19/2020 12:52:57 Adam Merritt ([id number]) -------------------------------------------------------------------------------- Wound Assessment Details Patient Name: Adam Merritt Date of Service: 07/19/2020 12:30 PM Medical Record Number: [id number] Patient Account Number: [id number] Date of Birth/Sex: [birth year] (74 y.o. M) Treating RN: Carlene Coria Primary Care Dazja Houchin: [name] Other  Clinician: Referring Emmit Oriley: [name] Treating Savanna Dooley/Extender: Jeri Cos [name]: 8 Wound Status Wound Number: [id number] Primary Lymphedema Etiology: Wound Location: Right, Lateral Lower Leg Wound Open Wounding Event: Skin Tear/Laceration Status: Date Acquired: 06/26/2020 Comorbid Anemia, Lymphedema, Chronic Obstructive Pulmonary Weeks Of Treatment: 3 History: Disease (COPD), Sleep Apnea, Congestive Heart Failure, Clustered Wound: No Coronary Artery Disease, Hypertension, Peripheral Venous Disease, Type II Diabetes, Osteoarthritis, Neuropathy, Received Chemotherapy Photos Wound Measurements Length: (cm) 0 Width: (cm) 0 Depth: (cm) 0 Area: (cm) 0 Volume: (cm) 0 % Reduction in Area: 100% % Reduction in Volume: 100% Epithelialization: Large (67-100%) Undermining: No Wound Description Classification: Partial Thickness Wound Margin: Flat and Intact Exudate Amount: None Present Foul Odor After Cleansing: No Slough/Fibrino No Wound Bed Granulation Amount: None Present (0%) Exposed Structure Necrotic Amount: None Present (0%) Fascia Exposed: No Fat Layer (Subcutaneous Tissue) Exposed: No Tendon Exposed: No Muscle Exposed: No Joint Exposed: No Bone Exposed: No Electronic Signature(s) Signed: 07/19/2020 4:08:54 PM By: Carlene Coria RN Entered By: Carlene Coria on 07/19/2020 12:44:22 Adam Merritt ([id number]) -------------------------------------------------------------------------------- [place] Details Patient Name: Adam Merritt, Adam Merritt Date of Service: 07/19/2020 12:30 PM Medical Record Number: [id number] Patient Account Number: [id number] Date of Birth/Sex: 05-04-1946 (75 y.o. M) Treating RN: Carlene Coria Primary Care Jamyron Redd: [name] Other Clinician: Referring Longino Trefz: [name] Treating Abeer Deskins/Extender: Skipper Cliche in Treatment: 8 Vital Signs Time Taken: 12:43 Temperature (F): 98.4 Height (in): 66 Pulse (bpm): 60 Weight (lbs):  288 Respiratory Rate (breaths/min): 18 Body Mass Index (BMI): 46.5 Blood Pressure (mmHg): 147/82 Reference Range: 80 - 120 mg / dl Electronic Signature(s) Signed: 07/19/2020 4:08:54 PM By: Carlene Coria RN Entered By: Carlene Coria on 07/19/2020 12:43:19

## 2020-07-19 NOTE — Progress Notes (Addendum)
AVEDIS, BEVIS ([id number]) Visit Report for 07/19/2020 Chief Complaint Document Details Patient Name: RAYMONDO, GARCIALOPEZ Date of Service: 07/19/2020 12:30 PM Medical Record Number: [id number] Patient Account Number: [id number] Date of Birth/Sex: 1945/11/30 (75 y.o. M) Treating RN: Carlene Coria Primary Care Provider: [name] Other Clinician: Referring Provider: [name] Treating Provider/Extender: Skipper Cliche in Treatment: 8 Information Obtained from: Patient Chief Complaint Left leg ulcers Electronic Signature(s) Signed: 07/19/2020 12:47:36 PM By: Worthy Keeler PA-C Entered By: Worthy Keeler on 07/19/2020 12:47:36 [name] ([id number]) -------------------------------------------------------------------------------- HPI Details Patient Name: [name] Date of Service: 07/19/2020 12:30 PM Medical Record Number: [id number] Patient Account Number: [id number] Date of Birth/Sex: 02-22-1946 (74 y.o. M) Treating RN: Carlene Coria Primary Care Provider: [name] Other Clinician: Referring Provider: [name] Treating Provider/Extender: Skipper Cliche in Treatment: 8 History of Present Illness HPI Description: 10/18/17-He is here for initial evaluation of bilateral lower extremity ulcerations in the presence of venous insufficiency and lymphedema. He has been seen by vascular medicine in the past, Dr. Lucky Cowboy, last seen in 2016. He does have a history of abnormal ABIs, which is to be expected given his lymphedema and venous insufficiency. According to Epic, it appears that all attempts for arterial evaluation and/or angiography were not follow through with by patient. He does have a history of being seen in lymphedema clinic in 2018, stopped going approximately 6 months ago stating "it didn't do any good". He does not have lymphedema pumps, he does not have custom fit compression wrap/stockings. He is diabetic and his recent A1c last month was 7.6. He admits to chronic  bilateral lower extremity pain, no change in pain since blister and ulceration development. He is currently being treated with Levaquin for bronchitis. He has home health and we will continue. 10/25/17-He is here in follow-up evaluation for bilateral lower extremity ulcerationssubtle he remains on Levaquin for bronchitis. Right lower extremity with no evidence of drainage or ulceration, persistent left lower extremity ulceration. He states that home health has not been out since his appointment. He went to [facility] on Tuesday, studies revealed: RIGHT ABI 0.9, TBI 0.6 LEFT ABI 1.1, TBI 0.6 with triphasic flow bilaterally. We will continue his same treatment plan. He has been educated on compression therapy and need for elevation. He will benefit from lymphedema pumps 11/01/17-He is here in follow-up evaluation for left lower extremity ulcer. The right lower extremity remains healed. He has home health services, but they have not been out to see the patient for 2-3 weeks. He states it home health physical therapy changed his dressing yesterday after therapy; he placed Ace wrap compression. We are still waiting for lymphedema pumps, reordered d/t need for company change. 11/08/17-He is here in follow-up evaluation for left lower extremity ulcer. It is improved. Edema is significantly improved with compression therapy. We will continue with same treatment plan and he will follow-up next week. No word regarding lymphedema pumps 11/15/17-He is here in follow-up evaluation for left lower extremity ulcer. He is healed and will be discharged from wound care services. I have reached out to [facility] regarding his lymphedema pumps. They have been unable to reach the patient; the contact number they had with the patient's wife's cell phone and she has not answered any unrecognized calls. Contact should be made today, trial planned for next week; [facility] will continue to  follow 11/27/17 on evaluation today patient has multiple blistered areas over the right lower extremity his  left lower extremity appears to be doing okay. These blistered areas show signs of no infection which is great news. With that being said he did have some necrotic skin overlying which was mechanically debrided away with saline and gauze today without complication. Overall post debridement the wounds appear to be doing better but in general his swelling seems to be increased. This is obviously not good news. I think this is what has given rise to the blisters. 12/04/17 on evaluation today patient presents for follow-up concerning his bilateral lower extremity edema in the right lower extremity ulcers. He has been tolerating the dressing changes without complication. With that being said he has had no real issues with the wraps which is also good news. Overall I'm pleased with the progress he's been making. 12/11/17 on evaluation today patient appears to be doing rather well in regard to his right lateral lower extremity ulcer. He's been tolerating the dressing changes without complication. Fortunately there does not appear to be any evidence of infection at this time. Overall I'm pleased with the progress that is being made. Unfortunately he has been in the hospital due to having what sounds to be a stomach virus/flu fortunately that is starting to get better. 12/18/17 on evaluation today patient actually appears to be doing very well in regard to his bilateral lower extremities the swelling is under fairly good control his lymphedema pumps are still not up and running quite yet. With that being said he does have several areas of opening noted as far as wounds are concerned mainly over the left lower extremity. With that being said I do believe once he gets lymphedema pumps this would least help you mention some the fluid and preventing this from occurring. Hopefully that will be set up soon sleeves  are Artie in place at his home he just waiting for the machine. 12/25/17 on evaluation today patient actually appears to be doing excellent in fact all of his ulcers appear to have resolved his legs appear very well. I do think he needs compression stockings we have discussed this and they are actually going to go to [place] today to elastic therapy to get this fitted for him. I think that is definitely a good thing to do. Readmission: 04/09/18 upon evaluation today this patient is seen for readmission due to bilateral lower extremity lymphedema. He has significant swelling of his extremities especially on the left although the right is also swollen he has weeping from both sides. There are no obvious open wounds at this point. Fortunately he has been doing fairly well for quite a bit of time since I last saw him. Nonetheless unfortunately this seems to have reopened and is giving quite a bit of trouble. He states this began about a week ago when he first called [place] to get in to be seen. No fevers, chills, nausea, or vomiting noted at this time. He has not been using his lymphedema pumps due to the fact that they won't fit on his leg at this point likewise is also not been using his compression for essentially the same reason. 04/16/18 upon evaluation today patient actually appears to be doing a little better in regard to the fluid in his bilateral lower extremities. With that being said he's had three falls since I saw him last week. He also states that he's been feeling very poorly. I was concerned last week and feel like that the concern is still there as far as the congestion in his chest  is concerned he seems to be breathing about the same as last week but again he states he's very weak he's not even able to walk further than from the chair to the door. His wife had to buy a wheelchair just to be able to get them out of the house to get to the appointment today. This has me very  concerned. 04/23/18 on evaluation today patient actually appears to be doing much better than last week's evaluation. At that time actually had to transport him to the ER via EMS and he subsequently was admitted for acute pulmonary edema, acute renal failure, and acute congestive heart failure. Fortunately he is doing much better. Apparently they did dialyze him and were able to take off roughly 35 pounds of fluid. Nonetheless he is feeling much better both in regard to his breathing and he's able to get around much better at this time compared to previous. Overall I'm very LANE, ELAND ([id number]) happy with how things are at this time. There does not appear to be any evidence of infection currently. No fevers, chills, nausea, or vomiting noted at this time. 04/30/2018 patient seen today for follow-up and management of bilateral lower extremity lymphedema. He did express being more sad today than usual due to the recent loss of his dog. He states that he has been compliant with using the lymphedema pumps. However he does admit a minute over the last 2-3 days he has not been using the pumps due to the recent loss of his dog. At this time there is no drainage or open wounds to his lower extremities. The left leg edema is measuring smaller today. Still has a significant amount of edema on bilateral lower extremities With dry flaky skin. He will be referred to the lymphedema clinic for further management. Will continue 3 layer compression wraps and follow-up in 1-2 weeks.Denies any pain, fever, chairs recently. No recent falls or injuries reported during this visit. 05/07/18 on evaluation today patient actually appears to be doing very well in regard to his lower extremities in general all things considered. With that being said he is having some pain in the legs just due to the amount of swelling. He does have an area where he had a blister on the left lateral lower extremity this is open at this  point other than that there's nothing else weeping at this time. 05/14/18 on evaluation today patient actually appears to be doing excellent all things considered in regard to his lower extremities. He still has a couple areas of weeping on each leg which has continued to be the issue for him. He does have an appointment with the lymphedema clinic although this isn't until February 2020. That was the earliest they had. In the meantime he has continued to tolerate the compression wraps without complication. 05/28/18 on evaluation today patient actually appears to be doing more poorly in regard to his left lower extremity where he has a wound open at this point. He also had a fall where he subsequently injured his right great toe which has led to an open wound at the site unfortunately. He has been tolerating the dressing changes without complication in general as far as the wraps are concerned that he has not been putting any dressing on the left 1st toe ulcer site. 06/11/18 on evaluation today patient appears to be doing much worse in regard to his bilateral lower extremity ulcers. He has been tolerating the dressing changes without complication although his legs  have not been wrapped more recently. Overall I am not very pleased with the way his legs appear. I do believe he needs to be back in compression wraps he still has not received his compression wraps from the [facility] hospital as of yet. 06/18/18 on evaluation today patient actually appears to be doing significantly better than last time I saw him. He has been tolerating the compression wraps without complication in the circumferential ulcers especially appear to be doing much better. His toe ulcer on the right in regard to the great toe is better although not as good as the legs in my pinion. No fevers chills noted 07/02/18 on evaluation today patient appears to be doing much better in regard to his lower extremity ulcers. Unfortunately since I last  saw him he's had the distal portion of his right great toe if he dated it sounds as if this actually went downhill very quickly. I had only seen him a few days prior and the toe did not appear to be infected at that point subsequently became infected very rapidly and it was decided by the surgeon that the distal portion of the toe needed to be removed. The patient seems to be doing well in this regard he tells me. With that being said his lower extremities are doing better from the standpoint of the wounds although he is significantly swollen at this point. 07/09/18 on evaluation today patient appears to be doing better in regard to the wounds on his lower extremities. In fact everything is almost completely healed he is just a small area on the left posterior lower extremity that is open at this point. 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 With that being said he needs some kind of compression it does sound like he may not be wearing his compression, that is the wraps, during the entire time between when he's here visit to visit. Apparently his wife took the current one off because it began to "fall apart". 07/16/18 on evaluation today patient appears to be extremely swollen especially in regard to his left lower extremity unfortunately. He also has a new skin tear over the left lower extremity and there's a smaller area on the right lower extremity as well. Unfortunately this seems to be due in part to blistering and fluid buildup in his leg. He did get the reduction wraps that were ordered by the [facility] hospital for him to go to lymphedema clinic. With that being said his wounds on the legs have not healed to the point to where they would likely accept them as a patient lymphedema clinic currently. We need to try to  get this to heal. With that being said he's been taking his wraps off which is not doing him any favors at this point. In fact this is probably quite counterproductive compared to what needs to occur. We will likely need to increase to a four layer compression wrap and continue to also utilize elevation and he has to keep the wraps on not take them off as he's been doing currently hasn't had a wrap on since Saturday. 07/23/18 on evaluation today patient appears to be doing much better in regard to his bilateral lower extremities. In fact his left lower extremity which was the largest is actually 15 cm smaller today compared to what it  was last time he was here in our clinic. This is obviously good news after just one week. Nonetheless the differences he actually kept the wraps on during the entire week this time. That's not typical for him. I do believe he understands a little bit better now the severity of the situation and why it's important for him to keep these wraps on. 07/30/18 on evaluation today patient actually appears to be doing rather well in regard to his lower extremities. His legs are much smaller than they have been in the past and he actually has only one very small rudder superficial region remaining that is not closed on the left lateral/posterior lower extremity even this is almost completely close. I do believe likely next week he will be healed without any complications. I do think we need to continue the wraps however this seems to be beneficial for him. I also think it may be a good time for [place] to go ahead and see about getting the appointment with the lymphedema clinic which is supposed to be made for him in order to keep this moving along and hopefully get them into compression wraps that will in the end help him to remain healed. 08/06/18 on evaluation today patient actually appears to be doing very well in regard as bilateral lower extremities. In fact his wounds appear to  be completely healed at this time. 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 He is in the process of getting appointment with the lymphedema clinic we have made this referral were just waiting to hear back on the schedule time. We need to follow up on that today as well. 08/13/18 on evaluation today patient actually appears to be doing very well in regard to his bilateral lower extremities there are no open wounds at this point. We are gonna go ahead and see about ordering the Velcro compression wraps for him had a discussion with them about [place] doing it versus the [place] they feel like they can definitely afford going ahead and get the wraps themselves and they would prefer to try to avoid having to go to the lymphedema clinic if it all possible which I completely understand. As long as he has good compression I'm okay either way. 3/17//20 on evaluation today patient actually appears to be doing well in regard to his bilateral lower extremity ulcers. He has been tolerating the dressing changes without complication specifically the compression wraps. Overall is had no issues my fingers finding that I see at this point is that he is having trouble with constipation. He tells me he has not been able to go to the bathroom for about six days. He's taken to over-the- counter oral laxatives unfortunately this is not helping. He has not contacted his doctor. JERONIMO, HELLBERG ([id number]) 08/27/18 on evaluation today patient appears to be doing fairly well in regard to his lower extremities at this point. There does not appear to be any new altars is swelling is very well controlled. We are still waiting for his Velcro compression wraps to arrive that should be sometime in the next week is Artie sent the check for these. 09/03/18 on evaluation today patient appears to be doing excellent in regard to his bilateral lower extremities which he shows no signs of  wound openings in regard to this point. He does have his Velcro compression wraps which did arrive in the mail since I saw him last week. Overall he is  doing excellent in my pinion. 09/13/18 on evaluation today patient appears to be doing very well currently in regard to the overall appearance of his bilateral lower extremities although he's a little bit more swollen than last time we saw him. At that point he been discharged without any open wounds. Nonetheless he has a small open wound on the posterior left lower extremity with some evidence of cellulitis noted as well. Fortunately I feel like he has made good progress overall with regard to his lower extremities from were things used to be. 09/20/18 on evaluation today patient actually appears to be doing much better. The erythematous lower extremity is improving wound itself which is still open appears to be doing much better as far as but appearance as well as pain is concerned overall very pleased in this regard. There's no signs of active infection at this time. 09/27/18 on evaluation today patient's wounds on the lower extremity actually appear to be doing fairly well at this time which is good news. There is no evidence of active infection currently and again is just as left lower extremity were there any wounds at this point anyway. I believe they may be completely healed but again I'm not 100% sure based on evaluation today. I think one more week of observation would likely be a good idea. 10/04/18 on evaluation today patient actually appears to be doing excellent in regard to the left lower extremity which actually appears to be completely healed as of today. Unfortunately he's been having issues with his right lower extremity have a new wound that has opened. Fortunately there's no evidence of active infection at this time which is good news. 10/10/18 upon evaluation today patient actually appears to be doing a little bit worse with the new  open area on his right posterior lower extremity. He's been tolerating the dressing changes without complication. Right now we been using his compression wraps although I think we may need to switch back to actually performing bilateral compression wraps are in the clinic. No fevers, chills, nausea, or vomiting noted at this time. 10/17/18 on evaluation today patient actually appears to be doing quite well in regard to his bilateral lower extremity ulcers. He has been tolerating the reps without complication although he would prefer not to rewrap his legs as of today. Fortunately there's no signs of active infection which is good news. No fevers, chills, nausea, or vomiting noted at this time. 10/24/18 on evaluation today patient appears to be doing very well in regard to his lower extremities. His right lower extremity is shown signs of healing and his left lower [place] though not healed appears to be improving which is excellent news. Overall very pleased with how things seem to be progressing at this time. The patient is likewise happy to hear this. His Velcro compression wraps however have not been put on properly will gonna show his wife how to do that properly today. 10/31/18 on evaluation today patient appears to be doing more poorly in regard to his left lower extremity in particular although both lower extremities actually are showing some signs of being worse than my previous evaluation. Unfortunately I'm just not sure that his compression stockings even with the use of the compression/lymphedema pumps seem to be controlling this well. Upon further questioning he tells me that he also is not able to lie flat in the bed due to his congestive heart failure and difficulty breathing. For that reason he sleeps in his recliner. To make  matters worse his recliner also cannot even hold his legs up so instead of even being somewhat elevated they pretty much hang down to the floor. This is the way he  sleeps each night which is definitely counterproductive to everything else that were attempting to do from the standpoint of controlling his fluid. Nonetheless I think that he potentially could benefit from a hospital bed although this would be something that his primary care provider would likely have to order since anything that is order on our side has to be directly related to wound care and again the hospital bed is not necessarily a direct relation to although I think it does contribute to his overall wound status on his lower extremities. 11/07/18 on evaluation today patient appears to be doing better in regard to his bilateral lower extremity. He's been tolerating the dressing changes without complication. We did in the interim since I last saw him switch to just using extras orbiting new alginate and that seems to have done much better for him. I'm very pleased with the overall progress that is made. 11/14/18 on evaluation today patient appears to be redoing rather well in regard to his left lower extremity ulcers which are the only ones remaining at this point. Fortunately there's no signs of active infection at this time which is good news. Overall been very pleased with how things seem to be progressing currently. No fevers, chills, nausea, or vomiting noted at this time. 11/20/17 on evaluation today patient actually appears to be doing quite well in regard to his lower extremities on the left on the right he has several blisters that showed up although there's some question about whether or not he has had his broker compression wraps on like he was supposed to or not. Fortunately there's no signs of active infection at this time which is good news. Unfortunately though he is doing well from the standpoint of the left leg the right leg is not doing as well again this is when we did not wrap last week. 11/28/18 on evaluation today patient appears to be doing well in regard to his bilateral lower  extremities is left appears to be healed is right is not healed but is very close to being so. Overall very pleased with how things seem to be progressing. Patient is likewise happy that things are doing well. 12/09/18 on evaluation today patient actually appears to be completely healed which is excellent news. He actually seems to be doing well in regard to the swelling of the bilateral lower extremities which is also great news. Overall very pleased with how things seem to be progressing. Readmission: 03/18/2019 upon evaluation today patient presents for reevaluation concerning issues that he is having with his left lower extremity where he does have a wound noted upon inspection today. He also has a wound on the right second toe on the tip where it is apparently been rubbing in his shoe I did look at issues and you can actually see where this has been occurring as well. Fortunately there is no signs of infection with regard to the toe necessarily although it is very swollen compared to normal that does have me somewhat concerned about the possibility of further evaluation for infection/osteomyelitis. I would recommend an x-ray to start with. Otherwise he states that it is been 1-2 weeks that he has had the draining in regard to left lower extremity he does not know how this happened he has been wearing his compression appropriately and  I do see that as well today but nonetheless I do think that he needs to continue to be very cautious with regard to elevation as well. NICOLUS, OSE ([id number]) 03/25/2019 on evaluation today patient appears to be doing much better in regard to his lower extremity at this time. That is on the left. He did culture positive for Pseudomonas but the good news is he seems to be doing much better the gentamicin we applied topically seems to have done a great job for him. There is no signs of active infection at this time systemically and locally he is doing much better.  No fevers, chills, nausea, vomiting, or diarrhea. In regard to his right toe this seems to be doing much better there is very little pressure noted at this point I did clean up some of the callus today around the edges of the wound as well as the surface of the wound but to be honest he is progressing quite nicely. 10/30; the area on the left anterior lower tibia area is healed over. His edema control is adequate even though his use of the compression pumps seems very intermittent. He has a juxta lite stocking on the right leg and a think there is 1 for the left leg at home. His wife is putting these on. He has an area on the plantar right second toe which is a hammertoe they are trying to offload this 04/08/2019 on evaluation today patient appears to be doing well in regard to his lower extremities which is good news. We are seeing him today for his toe ulcer which is giving him a little bit more trouble but again still seems to be doing better at this point which is good news. He is going require some sharp debridement in regard to the toe today however. 04/15/2019 on evaluation today patient actually appears to be doing quite well with regard to his toe ulcer. I am very pleased with how things are doing in that regard. With regard to his lower extremity edema in general he tells me he is not been using the lymphedema pumps regularly although he does not use them. I think that he needs to use them more regularly he does have a small blister on the left lower extremity this is not open at this point but obviously it something that can get worse if he does not keep the compression going and the pumps going as well. 04/22/2019 on evaluation today patient appears to be doing well with regard to his toe ulcer. He has been tolerating the dressing changes without complication. This is measuring slightly smaller this week as compared to last week. Fortunately there is no signs of active infection at this  time. 05/06/2019 on evaluation today patient actually appears to be doing excellent. In fact his toe appears to be completely healed which is great news. There is no signs of active infection at this time. Readmission: Patient presents today for follow-up after having been discharged at the beginning of December 2020. He states that in recent weeks things have reopened and has been having more trouble at this point. With that being said fortunately there is no signs of active infection at this time. No fever chills noted. Nonetheless he also does have an issue with one of his toes as well that seems to be somewhat this is on the right foot second toe. 07/25/2019 upon evaluation today patient's lower extremities appear to be doing excellent bilaterally. I really feel like he  is showing signs of great improvement and in fact I think he is close to healing which is great news. There is no signs of active infection at this time. No fevers, chills, nausea, vomiting, or diarrhea. 07/31/2019 upon evaluation today patient appears to be doing excellent and in fact I think may be completely healed in regard to his right lower extremity that were still to monitor this 1 more week before closing it out. The left lower extremity is measuring smaller although not completely closed seems to be doing excellent. 08/07/2019 upon evaluation today patient appears to be doing excellent in regard to his wounds. In fact the right lower extremity is completely healed there is no issues here. The left lower extremity has 1 very small area still remaining fortunately there is no signs of infection and overall I think he is very close to closure of the here as well. 08/14/19 upon evaluation today patient appears to be doing excellent in regard to his lower extremities. In fact he appears to be completely healed as of today. Fortunately there's no signs of active infection at this time. No fevers, chills, nausea, or vomiting noted  at this time. READMISSION 09/26/2019 This is a 75 year old man who is well-known to this clinic having just been discharged on 08/14/2019. He has known lymphedema and chronic venous insufficiency. He has Farrow wrap stockings and also external compression pumps. He does not use the external compression pumps however. He does however apparently fairly faithfully use the stockings. They have not been lotion in his legs. He has developed new wounds on the right anterior as well as the left medial left lateral and left posterior calf. He returns for our review of this Past medical history includes coronary artery disease, hypertension, congestive heart failure, COPD, venous insufficiency with lymphedema, type 2 diabetes. He apparently has compression pumps, Farrow wraps and at 1 point a hospital bed although I believe he sleeps in a recliner 10/03/2019 upon evaluation today patient appears to be doing better with regard to his wounds in general. He has been tolerating the dressing changes without complication. Fortunately there is no signs of active infection. No fevers, chills, nausea, vomiting, or diarrhea. I do believe the compression wraps are helping as they always have in the past. 10/10/2019 upon evaluation today patient appears to be doing excellent at this point. His right lower extremity is measuring much better and in fact is completely healed. His left lower extremity is also measuring better though not healed seems to be doing excellent at this point which is great news. Overall I am very pleased with how things appear currently. 10/27/2019 upon evaluation today patient appears to be doing quite well with regard to his wounds currently. He has been tolerating the dressing changes without complication. Fortunately there is no signs of active infection at this time. In fact he is almost completely healed on the left and has just a very small area open and on the right he is completely  healed. 11/04/2019 upon evaluation today patient appears to be doing quite well with regard to his wounds. In fact he appears to be quite possibly completely healed on the left lower extremity now as well the right lower extremity is still doing well. With that being said unfortunately though this may be healed I think it is a very fragile area and I cannot even confirm there is not a very small opening still remaining. I really think he may benefit from 1 additional weeks wrapping before  discontinuing. 11/10/2019 upon evaluation today patient appears to be doing well in regard to his original wounds in fact everything that we were treating last week is completely healed on the left. Unfortunately he has a new skin tear just above where the wrap slipped down due to a fall he sustained causing this injury. 11/17/2019 on evaluation today patient appears to be doing better with regard to his wound. He has been tolerating the dressing changes without JERVON, REAM. ([id number]) complication. Fortunately there is no signs of active infection at this time. No fevers, chills, nausea, vomiting, or diarrhea. 11/24/2019 upon evaluation today patient appears to be doing well with regard to his wound. This is making good progress there does not appear to be signs of active infection but overall I do feel like he is headed in the correct direction. Overall he is tolerating the compression wrap as well. 12/02/2019 upon evaluation today patient appears to be doing well with regard to his leg ulcer. In fact this appears to be completely healed and this is the last of the wounds that he had but still the left anterior lower extremity. Fortunately there is no signs of active infection at this time. No fevers, chills, nausea, vomiting, or diarrhea. Readmission: 02/05/2020 on evaluation today patient appears to be doing well with regard to his wound all things considered. He actually tells me that a couple weeks ago he had  trouble with his wraps and therefore took him off for a couple of days in order to give his legs a break. It was during this time that he actually developed increased swelling and edema of his legs and blisters on both legs. That is when he called to make the appointment. Nonetheless since that time he began wearing his compression wraps which are Velcro in the meantime and has done extremely well with this. In fact his leg appears to be under good control as far as edema is concerned today it is nothing like what I am seeing when he has come in for evaluations in the past. They have been using some of the silver alginate at home which has been beneficial for him. Fortunately there is no signs of active infection at this time. No fevers, chills, nausea, vomiting, or diarrhea. 02/19/2020 on evaluation today patient unfortunately does not appear to be doing quite as well as I would like to see. He has no signs of active infection at this time but unfortunately he is not doing well in regard to the overall appearance of his left leg. He has a couple other areas that are weeping now and the leg is much more swollen. I think that he needs to actually have a compression wrap to try to manage this. 02/26/2020 on evaluation today patient appears to be doing well with regard to his left lower extremity ulcer. Fortunately the swelling is down I do believe the pressure wrap seems to be doing quite well. Fortunately there is no signs of active infection at this time. 03/05/2020 upon evaluation today patient appears to be doing excellent in regard to his legs at this point. Fortunately there is no signs of active infection which is great news. Overall he feels like he is completely healed which is great news. Readmission: 05/20/2020 upon evaluation today patient appears to be doing well with regard to his leg ulcer all things considered. He is actually coming back to see [place] after having had a reopening of the ulcers  on his left leg in  the past several weeks. He is out of dressing supplies and his wife is been try to take care of this as best she can. 06/10/2020 upon evaluation today patient unfortunately appears to be doing significantly worse today compared to when I last saw him. He and his wife both tell me that "there has been a lot going on". I did not actually go into detail about everything that has been happening but nonetheless he has not obviously been wearing his compression. He brings them with him today but they were not on. I am not even certain to be honest that he could get those on at this point. Nonetheless I do believe that he is going to require compression wrapping at some point shortly but right now we need to try to get what appears to be infection under control at this time. 06/28/2020 upon evaluation today patient appears to be doing well with regard to 06/28/2020 upon evaluation today patient appears to be doing well pretty well in regard to his left leg which is much better than previous. With that being said in regard to his right leg he does seem to be having some issues with a new wound after he sustained a fall. This fortunately is not too deep but does appear to be something we need to address. Neither deep which is good. 07/05/2020 on evaluation today patient appears to be doing well with regard to his wounds. In fact everything on the left leg appears to be pretty much healed on the right leg this is very close though not completely closed yet. Fortunately there is no evidence of active infection at this time. No fevers, chills, nausea, vomiting, or diarrhea. 07/13/2019 upon evaluation today patient appears to be making good progress which is great news. Overall I am extremely pleased with where things stand today. There does not appear to be any signs of active infection which is great news and overall his left leg appears healed still the right leg though not healed does seem to be  making good progress which is excellent news. He is having no pain. 07/19/2020 on evaluation today patient appears to be doing well with regard to his wound. He has been tolerating the dressing changes without complication. Fortunately there does not appear to be any signs of active infection at this time. No fevers, chills, nausea, vomiting, or diarrhea. Electronic Signature(s) Signed: 07/19/2020 12:55:01 PM By: Worthy Keeler PA-C Entered By: Worthy Keeler on 07/19/2020 12:55:00 [name] ([id number]) -------------------------------------------------------------------------------- Physical Exam Details Patient Name: [name] Date of Service: 07/19/2020 12:30 PM Medical Record Number: [id number] Patient Account Number: [id number] Date of Birth/Sex: December 31, 1945 (75 y.o. M) Treating RN: Carlene Coria Primary Care Provider: [name] Other Clinician: Referring Provider: [name] Treating Provider/Extender: Jeri Cos [name]: 8 Constitutional Obese and well-hydrated in no acute distress. Respiratory normal breathing without difficulty. Psychiatric this patient is able to make decisions and demonstrates good insight into disease process. Alert and Oriented x 3. pleasant and cooperative. Notes Patient's wounds appear to show signs of complete epithelization. There is no openings at this time and overall he seems to be doing quite well and very pleased in that regard. Electronic Signature(s) Signed: 07/19/2020 12:55:20 PM By: Worthy Keeler PA-C Entered By: Worthy Keeler on 07/19/2020 12:55:20 [name] ([id number]) -------------------------------------------------------------------------------- Physician Orders Details Patient Name: [name] Date of Service: 07/19/2020 12:30 PM Medical Record Number: [id number] Patient Account Number: [id number] Date of Birth/Sex:  1945/09/26 (74 y.o. M) Treating RN: Carlene Coria Primary Care Provider: [name]  Other Clinician: Referring Provider: [name] Treating Provider/Extender: Skipper Cliche in Treatment: 8 Verbal / Phone Orders: No Diagnosis Coding ICD-10 Coding Code Description I89.0 Lymphedema, not elsewhere classified I87.2 Venous insufficiency (chronic) (peripheral) L97.822 Non-pressure chronic ulcer of other part of left lower leg with fat layer exposed Discharge From [facility] o Discharge from [facility] Treatment Complete o Wear compression garments daily. Put garments on first thing when you wake up and remove them before bed. o Moisturize legs daily after removing compression garments. o Elevate, Exercise Daily and Avoid Standing for Long Periods of Time. o DO YOUR BEST to sleep in the bed at night. DO NOT sleep in your recliner. Long hours of sitting in a recliner leads to swelling of the legs and/or potential wounds on your backside. Electronic Signature(s) Signed: 07/19/2020 4:08:54 PM By: Carlene Coria RN Signed: 07/20/2020 9:55:27 AM By: Worthy Keeler PA-C Entered By: Carlene Coria on 07/19/2020 12:51:54 [name] ([id number]) -------------------------------------------------------------------------------- Problem List Details Patient Name: [name] Date of Service: 07/19/2020 12:30 PM Medical Record Number: [id number] Patient Account Number: [id number] Date of Birth/Sex: 07-11-1945 (74 y.o. M) Treating RN: Carlene Coria Primary Care Provider: [name] Other Clinician: Referring Provider: [name] Treating Provider/Extender: Skipper Cliche in Treatment: 8 Active Problems ICD-10 Encounter Code Description Active Date MDM Diagnosis I89.0 Lymphedema, not elsewhere classified 05/20/2020 No Yes I87.2 Venous insufficiency (chronic) (peripheral) 05/20/2020 No Yes L97.822 Non-pressure chronic ulcer of other part of left lower leg with fat layer 05/20/2020 No Yes exposed Inactive Problems Resolved Problems Electronic  Signature(s) Signed: 07/19/2020 12:47:28 PM By: Worthy Keeler PA-C Entered By: Worthy Keeler on 07/19/2020 12:47:28 [name] ([id number]) -------------------------------------------------------------------------------- Progress Note Details Patient Name: [name] Date of Service: 07/19/2020 12:30 PM Medical Record Number: [id number] Patient Account Number: [id number] Date of Birth/Sex: 29-Jan-1946 (74 y.o. M) Treating RN: Carlene Coria Primary Care Provider: [name] Other Clinician: Referring Provider: [name] Treating Provider/Extender: Skipper Cliche in Treatment: 8 Subjective Chief Complaint Information obtained from Patient Left leg ulcers History of Present Illness (HPI) 10/18/17-He is here for initial evaluation of bilateral lower extremity ulcerations in the presence of venous insufficiency and lymphedema. He has been seen by vascular medicine in the past, Dr. Lucky Cowboy, last seen in 2016. He does have a history of abnormal ABIs, which is to be expected given his lymphedema and venous insufficiency. According to Epic, it appears that all attempts for arterial evaluation and/or angiography were not follow through with by patient. He does have a history of being seen in lymphedema clinic in 2018, stopped going approximately 6 months ago stating "it didn't do any good". He does not have lymphedema pumps, he does not have custom fit compression wrap/stockings. He is diabetic and his recent A1c last month was 7.6. He admits to chronic bilateral lower extremity pain, no change in pain since blister and ulceration development. He is currently being treated with Levaquin for bronchitis. He has home health and we will continue. 10/25/17-He is here in follow-up evaluation for bilateral lower extremity ulcerationssubtle he remains on Levaquin for bronchitis. Right lower extremity with no evidence of drainage or ulceration, persistent left lower extremity ulceration. He states  that home health has not been out since his appointment. He went to [facility] on Tuesday, studies revealed: RIGHT ABI 0.9, TBI 0.6 LEFT ABI 1.1, TBI 0.6 with triphasic  flow bilaterally. We will continue his same treatment plan. He has been educated on compression therapy and need for elevation. He will benefit from lymphedema pumps 11/01/17-He is here in follow-up evaluation for left lower extremity ulcer. The right lower extremity remains healed. He has home health services, but they have not been out to see the patient for 2-3 weeks. He states it home health physical therapy changed his dressing yesterday after therapy; he placed Ace wrap compression. We are still waiting for lymphedema pumps, reordered d/t need for company change. 11/08/17-He is here in follow-up evaluation for left lower extremity ulcer. It is improved. Edema is significantly improved with compression therapy. We will continue with same treatment plan and he will follow-up next week. No word regarding lymphedema pumps 11/15/17-He is here in follow-up evaluation for left lower extremity ulcer. He is healed and will be discharged from wound care services. I have reached out to [facility] regarding his lymphedema pumps. They have been unable to reach the patient; the contact number they had with the patient's wife's cell phone and she has not answered any unrecognized calls. Contact should be made today, trial planned for next week; [facility] will continue to follow 11/27/17 on evaluation today patient has multiple blistered areas over the right lower extremity his left lower extremity appears to be doing okay. These blistered areas show signs of no infection which is great news. With that being said he did have some necrotic skin overlying which was mechanically debrided away with saline and gauze today without complication. Overall post debridement the wounds appear to be doing better but in general his  swelling seems to be increased. This is obviously not good news. I think this is what has given rise to the blisters. 12/04/17 on evaluation today patient presents for follow-up concerning his bilateral lower extremity edema in the right lower extremity ulcers. He has been tolerating the dressing changes without complication. With that being said he has had no real issues with the wraps which is also good news. Overall I'm pleased with the progress he's been making. 12/11/17 on evaluation today patient appears to be doing rather well in regard to his right lateral lower extremity ulcer. He's been tolerating the dressing changes without complication. Fortunately there does not appear to be any evidence of infection at this time. Overall I'm pleased with the progress that is being made. Unfortunately he has been in the hospital due to having what sounds to be a stomach virus/flu fortunately that is starting to get better. 12/18/17 on evaluation today patient actually appears to be doing very well in regard to his bilateral lower extremities the swelling is under fairly good control his lymphedema pumps are still not up and running quite yet. With that being said he does have several areas of opening noted as far as wounds are concerned mainly over the left lower extremity. With that being said I do believe once he gets lymphedema pumps this would least help you mention some the fluid and preventing this from occurring. Hopefully that will be set up soon sleeves are Artie in place at his home he just waiting for the machine. 12/25/17 on evaluation today patient actually appears to be doing excellent in fact all of his ulcers appear to have resolved his legs appear very well. I do think he needs compression stockings we have discussed this and they are actually going to go to [place] today to elastic therapy to get this fitted for him. I think  that is definitely a good thing to do. Readmission: 04/09/18 upon  evaluation today this patient is seen for readmission due to bilateral lower extremity lymphedema. He has significant swelling of his extremities especially on the left although the right is also swollen he has weeping from both sides. There are no obvious open wounds at this point. Fortunately he has been doing fairly well for quite a bit of time since I last saw him. Nonetheless unfortunately this seems to have reopened and is giving quite a bit of trouble. He states this began about a week ago when he first called [place] to get in to be seen. No fevers, chills, nausea, or vomiting noted at this time. He has not been using his lymphedema pumps due to the fact that they won't fit on his leg at this point likewise is also not been using his compression for essentially the same reason. 04/16/18 upon evaluation today patient actually appears to be doing a little better in regard to the fluid in his bilateral lower extremities. With that being said he's had three falls since I saw him last week. He also states that he's been feeling very poorly. I was concerned last week and feel like that the concern is still there as far as the congestion in his chest is concerned he seems to be breathing about the same as last week but again he states he's very weak he's not even able to walk further than from the chair to the door. His wife had to buy a wheelchair just to be able to get them out of the house to get to the appointment today. This has me very concerned. CESARIO, WEIDINGER ([id number]) 04/23/18 on evaluation today patient actually appears to be doing much better than last week's evaluation. At that time actually had to transport him to the ER via EMS and he subsequently was admitted for acute pulmonary edema, acute renal failure, and acute congestive heart failure. Fortunately he is doing much better. Apparently they did dialyze him and were able to take off roughly 35 pounds of fluid. Nonetheless he is feeling  much better both in regard to his breathing and he's able to get around much better at this time compared to previous. Overall I'm very happy with how things are at this time. There does not appear to be any evidence of infection currently. No fevers, chills, nausea, or vomiting noted at this time. 04/30/2018 patient seen today for follow-up and management of bilateral lower extremity lymphedema. He did express being more sad today than usual due to the recent loss of his dog. He states that he has been compliant with using the lymphedema pumps. However he does admit a minute over the last 2-3 days he has not been using the pumps due to the recent loss of his dog. At this time there is no drainage or open wounds to his lower extremities. The left leg edema is measuring smaller today. Still has a significant amount of edema on bilateral lower extremities With dry flaky skin. He will be referred to the lymphedema clinic for further management. Will continue 3 layer compression wraps and follow-up in 1-2 weeks.Denies any pain, fever, chairs recently. No recent falls or injuries reported during this visit. 05/07/18 on evaluation today patient actually appears to be doing very well in regard to his lower extremities in general all things considered. With that being said he is having some pain in the legs just due to the amount of swelling.  He does have an area where he had a blister on the left lateral lower extremity this is open at this point other than that there's nothing else weeping at this time. 05/14/18 on evaluation today patient actually appears to be doing excellent all things considered in regard to his lower extremities. He still has a couple areas of weeping on each leg which has continued to be the issue for him. He does have an appointment with the lymphedema clinic although this isn't until February 2020. That was the earliest they had. In the meantime he has continued to tolerate the  compression wraps without complication. 05/28/18 on evaluation today patient actually appears to be doing more poorly in regard to his left lower extremity where he has a wound open at this point. He also had a fall where he subsequently injured his right great toe which has led to an open wound at the site unfortunately. He has been tolerating the dressing changes without complication in general as far as the wraps are concerned that he has not been putting any dressing on the left 1st toe ulcer site. 06/11/18 on evaluation today patient appears to be doing much worse in regard to his bilateral lower extremity ulcers. He has been tolerating the dressing changes without complication although his legs have not been wrapped more recently. Overall I am not very pleased with the way his legs appear. I do believe he needs to be back in compression wraps he still has not received his compression wraps from the [facility] hospital as of yet. 06/18/18 on evaluation today patient actually appears to be doing significantly better than last time I saw him. He has been tolerating the compression wraps without complication in the circumferential ulcers especially appear to be doing much better. His toe ulcer on the right in regard to the great toe is better although not as good as the legs in my pinion. No fevers chills noted 07/02/18 on evaluation today patient appears to be doing much better in regard to his lower extremity ulcers. Unfortunately since I last saw him he's had the distal portion of his right great toe if he dated it sounds as if this actually went downhill very quickly. I had only seen him a few days prior and the toe did not appear to be infected at that point subsequently became infected very rapidly and it was decided by the surgeon that the distal portion of the toe needed to be removed. The patient seems to be doing well in this regard he tells me. With that being said his lower extremities are doing  better from the standpoint of the wounds although he is significantly swollen at this point. 07/09/18 on evaluation today patient appears to be doing better in regard to the wounds on his lower extremities. In fact everything is almost completely healed he is just a small area on the left posterior lower extremity that is open at this point. 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 With that being said he needs some kind of compression it does sound like he may not be wearing his compression, that is the wraps, during the entire time between when he's here visit to visit. Apparently his wife took the current one off because it began to "fall apart". 07/16/18 on evaluation today patient appears to  be extremely swollen especially in regard to his left lower extremity unfortunately. He also has a new skin tear over the left lower extremity and there's a smaller area on the right lower extremity as well. Unfortunately this seems to be due in part to blistering and fluid buildup in his leg. He did get the reduction wraps that were ordered by the [facility] hospital for him to go to lymphedema clinic. With that being said his wounds on the legs have not healed to the point to where they would likely accept them as a patient lymphedema clinic currently. We need to try to get this to heal. With that being said he's been taking his wraps off which is not doing him any favors at this point. In fact this is probably quite counterproductive compared to what needs to occur. We will likely need to increase to a four layer compression wrap and continue to also utilize elevation and he has to keep the wraps on not take them off as he's been doing currently hasn't had a wrap on since Saturday. 07/23/18 on evaluation today patient appears to be  doing much better in regard to his bilateral lower extremities. In fact his left lower extremity which was the largest is actually 15 cm smaller today compared to what it was last time he was here in our clinic. This is obviously good news after just one week. Nonetheless the differences he actually kept the wraps on during the entire week this time. That's not typical for him. I do believe he understands a little bit better now the severity of the situation and why it's important for him to keep these wraps on. 07/30/18 on evaluation today patient actually appears to be doing rather well in regard to his lower extremities. His legs are much smaller than they have been in the past and he actually has only one very small rudder superficial region remaining that is not closed on the left lateral/posterior lower extremity even this is almost completely close. I do believe likely next week he will be healed without any complications. I do think we need to continue the wraps however this seems to be beneficial for him. I also think it may be a good time for [place] to go ahead and see about getting the appointment with the lymphedema clinic which is supposed to be made for him in order to keep this moving along and hopefully get them into compression wraps that will in the end help him to remain healed. 08/06/18 on evaluation today patient actually appears to be doing very well in regard as bilateral lower extremities. In fact his wounds appear to be completely healed at this time. 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 He is in the process of getting appointment with the lymphedema clinic we have made this referral were just waiting to hear back on the schedule time. We need to follow up on that today as well. 08/13/18 on evaluation today patient actually appears to be doing very well in regard to his bilateral lower extremities there are no open  wounds at this point. We are gonna go ahead and see about ordering the Velcro compression wraps for him had a discussion with them about [place] doing it versus the [place] they feel like they can definitely afford going ahead and get the wraps themselves and they would prefer to try to avoid having to go to the lymphedema clinic if it all  possible which I completely understand. As long as he has good compression I'm okay either way. ROARKE, MARCIANO ([id number]) 3/17//20 on evaluation today patient actually appears to be doing well in regard to his bilateral lower extremity ulcers. He has been tolerating the dressing changes without complication specifically the compression wraps. Overall is had no issues my fingers finding that I see at this point is that he is having trouble with constipation. He tells me he has not been able to go to the bathroom for about six days. He's taken to over-the- counter oral laxatives unfortunately this is not helping. He has not contacted his doctor. 08/27/18 on evaluation today patient appears to be doing fairly well in regard to his lower extremities at this point. There does not appear to be any new altars is swelling is very well controlled. We are still waiting for his Velcro compression wraps to arrive that should be sometime in the next week is Artie sent the check for these. 09/03/18 on evaluation today patient appears to be doing excellent in regard to his bilateral lower extremities which he shows no signs of wound openings in regard to this point. He does have his Velcro compression wraps which did arrive in the mail since I saw him last week. Overall he is doing excellent in my pinion. 09/13/18 on evaluation today patient appears to be doing very well currently in regard to the overall appearance of his bilateral lower extremities although he's a little bit more swollen than last time we saw him. At that point he been discharged without any open wounds. Nonetheless he  has a small open wound on the posterior left lower extremity with some evidence of cellulitis noted as well. Fortunately I feel like he has made good progress overall with regard to his lower extremities from were things used to be. 09/20/18 on evaluation today patient actually appears to be doing much better. The erythematous lower extremity is improving wound itself which is still open appears to be doing much better as far as but appearance as well as pain is concerned overall very pleased in this regard. There's no signs of active infection at this time. 09/27/18 on evaluation today patient's wounds on the lower extremity actually appear to be doing fairly well at this time which is good news. There is no evidence of active infection currently and again is just as left lower extremity were there any wounds at this point anyway. I believe they may be completely healed but again I'm not 100% sure based on evaluation today. I think one more week of observation would likely be a good idea. 10/04/18 on evaluation today patient actually appears to be doing excellent in regard to the left lower extremity which actually appears to be completely healed as of today. Unfortunately he's been having issues with his right lower extremity have a new wound that has opened. Fortunately there's no evidence of active infection at this time which is good news. 10/10/18 upon evaluation today patient actually appears to be doing a little bit worse with the new open area on his right posterior lower extremity. He's been tolerating the dressing changes without complication. Right now we been using his compression wraps although I think we may need to switch back to actually performing bilateral compression wraps are in the clinic. No fevers, chills, nausea, or vomiting noted at this time. 10/17/18 on evaluation today patient actually appears to be doing quite well in regard to his bilateral lower extremity ulcers.  He has  been tolerating the reps without complication although he would prefer not to rewrap his legs as of today. Fortunately there's no signs of active infection which is good news. No fevers, chills, nausea, or vomiting noted at this time. 10/24/18 on evaluation today patient appears to be doing very well in regard to his lower extremities. His right lower extremity is shown signs of healing and his left lower Trinity though not healed appears to be improving which is excellent news. Overall very pleased with how things seem to be progressing at this time. The patient is likewise happy to hear this. His Velcro compression wraps however have not been put on properly will gonna show his wife how to do that properly today. 10/31/18 on evaluation today patient appears to be doing more poorly in regard to his left lower extremity in particular although both lower extremities actually are showing some signs of being worse than my previous evaluation. Unfortunately I'm just not sure that his compression stockings even with the use of the compression/lymphedema pumps seem to be controlling this well. Upon further questioning he tells me that he also is not able to lie flat in the bed due to his congestive heart failure and difficulty breathing. For that reason he sleeps in his recliner. To make matters worse his recliner also cannot even hold his legs up so instead of even being somewhat elevated they pretty much hang down to the floor. This is the way he sleeps each night which is definitely counterproductive to everything else that were attempting to do from the standpoint of controlling his fluid. Nonetheless I think that he potentially could benefit from a hospital bed although this would be something that his primary care provider would likely have to order since anything that is order on our side has to be directly related to wound care and again the hospital bed is not necessarily a direct relation to  although I think it does contribute to his overall wound status on his lower extremities. 11/07/18 on evaluation today patient appears to be doing better in regard to his bilateral lower extremity. He's been tolerating the dressing changes without complication. We did in the interim since I last saw him switch to just using extras orbiting new alginate and that seems to have done much better for him. I'm very pleased with the overall progress that is made. 11/14/18 on evaluation today patient appears to be redoing rather well in regard to his left lower extremity ulcers which are the only ones remaining at this point. Fortunately there's no signs of active infection at this time which is good news. Overall been very pleased with how things seem to be progressing currently. No fevers, chills, nausea, or vomiting noted at this time. 11/20/17 on evaluation today patient actually appears to be doing quite well in regard to his lower extremities on the left on the right he has several blisters that showed up although there's some question about whether or not he has had his broker compression wraps on like he was supposed to or not. Fortunately there's no signs of active infection at this time which is good news. Unfortunately though he is doing well from the standpoint of the left leg the right leg is not doing as well again this is when we did not wrap last week. 11/28/18 on evaluation today patient appears to be doing well in regard to his bilateral lower extremities is left appears to be healed is right is not  healed but is very close to being so. Overall very pleased with how things seem to be progressing. Patient is likewise happy that things are doing well. 12/09/18 on evaluation today patient actually appears to be completely healed which is excellent news. He actually seems to be doing well in regard to the swelling of the bilateral lower extremities which is also great news. Overall very pleased with  how things seem to be progressing. Readmission: 03/18/2019 upon evaluation today patient presents for reevaluation concerning issues that he is having with his left lower extremity where he does have a wound noted upon inspection today. He also has a wound on the right second toe on the tip where it is apparently been rubbing in his shoe I did look at issues and you can actually see where this has been occurring as well. Fortunately there is no signs of infection with regard to TYLEEK, SMICK. ([id number]) the toe necessarily although it is very swollen compared to normal that does have me somewhat concerned about the possibility of further evaluation for infection/osteomyelitis. I would recommend an x-ray to start with. Otherwise he states that it is been 1-2 weeks that he has had the draining in regard to left lower extremity he does not know how this happened he has been wearing his compression appropriately and I do see that as well today but nonetheless I do think that he needs to continue to be very cautious with regard to elevation as well. 03/25/2019 on evaluation today patient appears to be doing much better in regard to his lower extremity at this time. That is on the left. He did culture positive for Pseudomonas but the good news is he seems to be doing much better the gentamicin we applied topically seems to have done a great job for him. There is no signs of active infection at this time systemically and locally he is doing much better. No fevers, chills, nausea, vomiting, or diarrhea. In regard to his right toe this seems to be doing much better there is very little pressure noted at this point I did clean up some of the callus today around the edges of the wound as well as the surface of the wound but to be honest he is progressing quite nicely. 10/30; the area on the left anterior lower tibia area is healed over. His edema control is adequate even though his use of the compression  pumps seems very intermittent. He has a juxta lite stocking on the right leg and a think there is 1 for the left leg at home. His wife is putting these on. He has an area on the plantar right second toe which is a hammertoe they are trying to offload this 04/08/2019 on evaluation today patient appears to be doing well in regard to his lower extremities which is good news. We are seeing him today for his toe ulcer which is giving him a little bit more trouble but again still seems to be doing better at this point which is good news. He is going require some sharp debridement in regard to the toe today however. 04/15/2019 on evaluation today patient actually appears to be doing quite well with regard to his toe ulcer. I am very pleased with how things are doing in that regard. With regard to his lower extremity edema in general he tells me he is not been using the lymphedema pumps regularly although he does not use them. I think that he needs to use them  more regularly he does have a small blister on the left lower extremity this is not open at this point but obviously it something that can get worse if he does not keep the compression going and the pumps going as well. 04/22/2019 on evaluation today patient appears to be doing well with regard to his toe ulcer. He has been tolerating the dressing changes without complication. This is measuring slightly smaller this week as compared to last week. Fortunately there is no signs of active infection at this time. 05/06/2019 on evaluation today patient actually appears to be doing excellent. In fact his toe appears to be completely healed which is great news. There is no signs of active infection at this time. Readmission: Patient presents today for follow-up after having been discharged at the beginning of December 2020. He states that in recent weeks things have reopened and has been having more trouble at this point. With that being said fortunately there  is no signs of active infection at this time. No fever chills noted. Nonetheless he also does have an issue with one of his toes as well that seems to be somewhat this is on the right foot second toe. 07/25/2019 upon evaluation today patient's lower extremities appear to be doing excellent bilaterally. I really feel like he is showing signs of great improvement and in fact I think he is close to healing which is great news. There is no signs of active infection at this time. No fevers, chills, nausea, vomiting, or diarrhea. 07/31/2019 upon evaluation today patient appears to be doing excellent and in fact I think may be completely healed in regard to his right lower extremity that were still to monitor this 1 more week before closing it out. The left lower extremity is measuring smaller although not completely closed seems to be doing excellent. 08/07/2019 upon evaluation today patient appears to be doing excellent in regard to his wounds. In fact the right lower extremity is completely healed there is no issues here. The left lower extremity has 1 very small area still remaining fortunately there is no signs of infection and overall I think he is very close to closure of the here as well. 08/14/19 upon evaluation today patient appears to be doing excellent in regard to his lower extremities. In fact he appears to be completely healed as of today. Fortunately there's no signs of active infection at this time. No fevers, chills, nausea, or vomiting noted at this time. READMISSION 09/26/2019 This is a 75 year old man who is well-known to this clinic having just been discharged on 08/14/2019. He has known lymphedema and chronic venous insufficiency. He has Farrow wrap stockings and also external compression pumps. He does not use the external compression pumps however. He does however apparently fairly faithfully use the stockings. They have not been lotion in his legs. He has developed new wounds on the  right anterior as well as the left medial left lateral and left posterior calf. He returns for our review of this Past medical history includes coronary artery disease, hypertension, congestive heart failure, COPD, venous insufficiency with lymphedema, type 2 diabetes. He apparently has compression pumps, Farrow wraps and at 1 point a hospital bed although I believe he sleeps in a recliner 10/03/2019 upon evaluation today patient appears to be doing better with regard to his wounds in general. He has been tolerating the dressing changes without complication. Fortunately there is no signs of active infection. No fevers, chills, nausea, vomiting, or diarrhea. I do  believe the compression wraps are helping as they always have in the past. 10/10/2019 upon evaluation today patient appears to be doing excellent at this point. His right lower extremity is measuring much better and in fact is completely healed. His left lower extremity is also measuring better though not healed seems to be doing excellent at this point which is great news. Overall I am very pleased with how things appear currently. 10/27/2019 upon evaluation today patient appears to be doing quite well with regard to his wounds currently. He has been tolerating the dressing changes without complication. Fortunately there is no signs of active infection at this time. In fact he is almost completely healed on the left and has just a very small area open and on the right he is completely healed. 11/04/2019 upon evaluation today patient appears to be doing quite well with regard to his wounds. In fact he appears to be quite possibly completely healed on the left lower extremity now as well the right lower extremity is still doing well. With that being said unfortunately though this may be healed I think it is a very fragile area and I cannot even confirm there is not a very small opening still remaining. I really think he may benefit from 1 additional  weeks wrapping before discontinuing. HILMAN, KISSLING ([id number]) 11/10/2019 upon evaluation today patient appears to be doing well in regard to his original wounds in fact everything that we were treating last week is completely healed on the left. Unfortunately he has a new skin tear just above where the wrap slipped down due to a fall he sustained causing this injury. 11/17/2019 on evaluation today patient appears to be doing better with regard to his wound. He has been tolerating the dressing changes without complication. Fortunately there is no signs of active infection at this time. No fevers, chills, nausea, vomiting, or diarrhea. 11/24/2019 upon evaluation today patient appears to be doing well with regard to his wound. This is making good progress there does not appear to be signs of active infection but overall I do feel like he is headed in the correct direction. Overall he is tolerating the compression wrap as well. 12/02/2019 upon evaluation today patient appears to be doing well with regard to his leg ulcer. In fact this appears to be completely healed and this is the last of the wounds that he had but still the left anterior lower extremity. Fortunately there is no signs of active infection at this time. No fevers, chills, nausea, vomiting, or diarrhea. Readmission: 02/05/2020 on evaluation today patient appears to be doing well with regard to his wound all things considered. He actually tells me that a couple weeks ago he had trouble with his wraps and therefore took him off for a couple of days in order to give his legs a break. It was during this time that he actually developed increased swelling and edema of his legs and blisters on both legs. That is when he called to make the appointment. Nonetheless since that time he began wearing his compression wraps which are Velcro in the meantime and has done extremely well with this. In fact his leg appears to be under good control as far as  edema is concerned today it is nothing like what I am seeing when he has come in for evaluations in the past. They have been using some of the silver alginate at home which has been beneficial for him. Fortunately there is no signs of  active infection at this time. No fevers, chills, nausea, vomiting, or diarrhea. 02/19/2020 on evaluation today patient unfortunately does not appear to be doing quite as well as I would like to see. He has no signs of active infection at this time but unfortunately he is not doing well in regard to the overall appearance of his left leg. He has a couple other areas that are weeping now and the leg is much more swollen. I think that he needs to actually have a compression wrap to try to manage this. 02/26/2020 on evaluation today patient appears to be doing well with regard to his left lower extremity ulcer. Fortunately the swelling is down I do believe the pressure wrap seems to be doing quite well. Fortunately there is no signs of active infection at this time. 03/05/2020 upon evaluation today patient appears to be doing excellent in regard to his legs at this point. Fortunately there is no signs of active infection which is great news. Overall he feels like he is completely healed which is great news. Readmission: 05/20/2020 upon evaluation today patient appears to be doing well with regard to his leg ulcer all things considered. He is actually coming back to see [place] after having had a reopening of the ulcers on his left leg in the past several weeks. He is out of dressing supplies and his wife is been try to take care of this as best she can. 06/10/2020 upon evaluation today patient unfortunately appears to be doing significantly worse today compared to when I last saw him. He and his wife both tell me that "there has been a lot going on". I did not actually go into detail about everything that has been happening but nonetheless he has not obviously been wearing his  compression. He brings them with him today but they were not on. I am not even certain to be honest that he could get those on at this point. Nonetheless I do believe that he is going to require compression wrapping at some point shortly but right now we need to try to get what appears to be infection under control at this time. 06/28/2020 upon evaluation today patient appears to be doing well with regard to 06/28/2020 upon evaluation today patient appears to be doing well pretty well in regard to his left leg which is much better than previous. With that being said in regard to his right leg he does seem to be having some issues with a new wound after he sustained a fall. This fortunately is not too deep but does appear to be something we need to address. Neither deep which is good. 07/05/2020 on evaluation today patient appears to be doing well with regard to his wounds. In fact everything on the left leg appears to be pretty much healed on the right leg this is very close though not completely closed yet. Fortunately there is no evidence of active infection at this time. No fevers, chills, nausea, vomiting, or diarrhea. 07/13/2019 upon evaluation today patient appears to be making good progress which is great news. Overall I am extremely pleased with where things stand today. There does not appear to be any signs of active infection which is great news and overall his left leg appears healed still the right leg though not healed does seem to be making good progress which is excellent news. He is having no pain. 07/19/2020 on evaluation today patient appears to be doing well with regard to his wound. He has  been tolerating the dressing changes without complication. Fortunately there does not appear to be any signs of active infection at this time. No fevers, chills, nausea, vomiting, or diarrhea. Objective Constitutional Obese and well-hydrated in no acute distress. Vitals Time Taken: 12:43 PM, Height:  66 in, Weight: 288 lbs, BMI: 46.5, Temperature: 98.4 F, Pulse: 60 bpm, Respiratory Rate: 18 breaths/min, Blood Pressure: 147/82 mmHg. RONAV, FURNEY ([id number]) Respiratory normal breathing without difficulty. Psychiatric this patient is able to make decisions and demonstrates good insight into disease process. Alert and Oriented x 3. pleasant and cooperative. General Notes: Patient's wounds appear to show signs of complete epithelization. There is no openings at this time and overall he seems to be doing quite well and very pleased in that regard. Integumentary (Hair, Skin) Wound #39 status is Open. Original cause of wound was Skin Tear/Laceration. The wound is located on the Right,Lateral Lower Leg. The wound measures 0cm length x 0cm width x 0cm depth; 0cm^2 area and 0cm^3 volume. There is no undermining noted. There is a none present amount of drainage noted. The wound margin is flat and intact. There is no granulation within the wound bed. There is no necrotic tissue within the wound bed. Assessment Active Problems ICD-10 Lymphedema, not elsewhere classified Venous insufficiency (chronic) (peripheral) Non-pressure chronic ulcer of other part of left lower leg with fat layer exposed Plan Discharge From [facility] Services: Discharge from [facility] Complete Wear compression garments daily. Put garments on first thing when you wake up and remove them before bed. Moisturize legs daily after removing compression garments. Elevate, Exercise Daily and Avoid Standing for Long Periods of Time. DO YOUR BEST to sleep in the bed at night. DO NOT sleep in your recliner. Long hours of sitting in a recliner leads to swelling of the legs and/or potential wounds on your backside. 1. Would recommend currently that we going discontinue wound care services at this point the patient is in agreement with the plan and I think he is doing excellent. 2. I am also can recommend that we have  the patient go ahead and wear his Velcro compression wraps. I think that he is doing a great job with this and hopefully he will continue as such. We will see him back for follow-up visit as needed. Electronic Signature(s) Signed: 07/19/2020 12:55:52 PM By: Worthy Keeler PA-C Previous Signature: 07/19/2020 12:55:39 PM Version By: Worthy Keeler PA-C Entered By: Worthy Keeler on 07/19/2020 12:55:52 [name] ([id number]) -------------------------------------------------------------------------------- SuperBill Details Patient Name: [name] Date of Service: 07/19/2020 Medical Record Number: [id number] Patient Account Number: [id number] Date of Birth/Sex: 08-Feb-1946 (74 y.o. M) Treating RN: Carlene Coria Primary Care Provider: [name] Other Clinician: Referring Provider: [name] Treating Provider/Extender: Skipper Cliche in Treatment: 8 Diagnosis Coding ICD-10 Codes Code Description I89.0 Lymphedema, not elsewhere classified I87.2 Venous insufficiency (chronic) (peripheral) L97.822 Non-pressure chronic ulcer of other part of left lower leg with fat layer exposed Facility Procedures CPT4 Code: [id number] Description: [phone] - WOUND CARE VISIT-LEV 2 EST PT Modifier: Quantity: 1 Physician Procedures CPT4 Code: [id number] Description: 46568 - WC PHYS LEVEL 3 - EST PT Modifier: Quantity: 1 CPT4 Code: Description: ICD-10 Diagnosis Description I89.0 Lymphedema, not elsewhere classified I87.2 Venous insufficiency (chronic) (peripheral) L97.822 Non-pressure chronic ulcer of other part of left lower leg with fat lay Modifier: er exposed Quantity: Electronic Signature(s) Signed: 07/19/2020 12:56:24 PM By: Worthy Keeler PA-C Entered By: Worthy Keeler on 07/19/2020 12:56:24

## 2020-07-22 DIAGNOSIS — E1142 Type 2 diabetes mellitus with diabetic polyneuropathy: Secondary | ICD-10-CM | POA: Diagnosis not present

## 2020-07-22 DIAGNOSIS — I890 Lymphedema, not elsewhere classified: Secondary | ICD-10-CM | POA: Diagnosis not present

## 2020-07-22 DIAGNOSIS — N183 Chronic kidney disease, stage 3 unspecified: Secondary | ICD-10-CM | POA: Diagnosis not present

## 2020-07-22 DIAGNOSIS — I5032 Chronic diastolic (congestive) heart failure: Secondary | ICD-10-CM | POA: Diagnosis not present

## 2020-07-22 DIAGNOSIS — E1122 Type 2 diabetes mellitus with diabetic chronic kidney disease: Secondary | ICD-10-CM | POA: Diagnosis not present

## 2020-07-22 DIAGNOSIS — S161XXD Strain of muscle, fascia and tendon at neck level, subsequent encounter: Secondary | ICD-10-CM | POA: Diagnosis not present

## 2020-07-22 DIAGNOSIS — I130 Hypertensive heart and chronic kidney disease with heart failure and stage 1 through stage 4 chronic kidney disease, or unspecified chronic kidney disease: Secondary | ICD-10-CM | POA: Diagnosis not present

## 2020-07-22 DIAGNOSIS — J441 Chronic obstructive pulmonary disease with (acute) exacerbation: Secondary | ICD-10-CM | POA: Diagnosis not present

## 2020-08-04 DIAGNOSIS — I509 Heart failure, unspecified: Secondary | ICD-10-CM | POA: Diagnosis not present

## 2020-08-20 ENCOUNTER — Other Ambulatory Visit: Payer: Self-pay

## 2020-08-20 ENCOUNTER — Encounter: Payer: Medicare PPO | Attending: Physician Assistant | Admitting: Physician Assistant

## 2020-08-20 DIAGNOSIS — I872 Venous insufficiency (chronic) (peripheral): Secondary | ICD-10-CM | POA: Diagnosis not present

## 2020-08-20 DIAGNOSIS — I890 Lymphedema, not elsewhere classified: Secondary | ICD-10-CM | POA: Insufficient documentation

## 2020-08-20 DIAGNOSIS — I509 Heart failure, unspecified: Secondary | ICD-10-CM | POA: Diagnosis not present

## 2020-08-20 DIAGNOSIS — E119 Type 2 diabetes mellitus without complications: Secondary | ICD-10-CM | POA: Diagnosis not present

## 2020-08-20 DIAGNOSIS — I2510 Atherosclerotic heart disease of native coronary artery without angina pectoris: Secondary | ICD-10-CM | POA: Diagnosis not present

## 2020-08-20 DIAGNOSIS — I110 Hypertensive heart disease with heart failure: Secondary | ICD-10-CM | POA: Diagnosis not present

## 2020-08-26 ENCOUNTER — Other Ambulatory Visit: Payer: Self-pay | Admitting: Hospice and Palliative Medicine

## 2020-08-27 ENCOUNTER — Other Ambulatory Visit: Payer: Self-pay

## 2020-08-27 ENCOUNTER — Encounter: Payer: Medicare PPO | Admitting: Physician Assistant

## 2020-08-27 DIAGNOSIS — I890 Lymphedema, not elsewhere classified: Secondary | ICD-10-CM | POA: Diagnosis not present

## 2020-08-27 DIAGNOSIS — I110 Hypertensive heart disease with heart failure: Secondary | ICD-10-CM | POA: Diagnosis not present

## 2020-08-27 DIAGNOSIS — E119 Type 2 diabetes mellitus without complications: Secondary | ICD-10-CM | POA: Diagnosis not present

## 2020-08-27 DIAGNOSIS — I509 Heart failure, unspecified: Secondary | ICD-10-CM | POA: Diagnosis not present

## 2020-08-27 DIAGNOSIS — I2510 Atherosclerotic heart disease of native coronary artery without angina pectoris: Secondary | ICD-10-CM | POA: Diagnosis not present

## 2020-08-27 DIAGNOSIS — I872 Venous insufficiency (chronic) (peripheral): Secondary | ICD-10-CM | POA: Diagnosis not present

## 2020-08-27 NOTE — Progress Notes (Addendum)
MAKARIOS, MADLOCK ([id number]) Visit Report for 08/27/2020 Chief Complaint Document Details Patient Name: Adam Merritt, Adam Merritt Date of Service: 08/27/2020 10:15 AM Medical Record Number: [id number] Patient Account Number: [id number] Date of Birth/Sex: 1945-12-05 (75 y.o. M) Treating RN: Carlene Coria Primary Care Provider: [name] Other Clinician: Jeanine Luz Referring Provider: [name] Treating Provider/Extender: Skipper Cliche in Treatment: 1 Information Obtained from: Patient Chief Complaint Bilateral LE Ulcers Electronic Signature(s) Signed: 08/27/2020 11:07:54 AM By: Worthy Keeler PA-C Entered By: Worthy Keeler on 08/27/2020 11:07:54 Adam Merritt ([id number]) -------------------------------------------------------------------------------- HPI Details Patient Name: Adam Merritt Date of Service: 08/27/2020 10:15 AM Medical Record Number: [id number] Patient Account Number: [id number] Date of Birth/Sex: 12/08/1945 (75 y.o. M) Treating RN: Carlene Coria Primary Care Provider: [name] Other Clinician: Jeanine Luz Referring Provider: [name] Treating Provider/Extender: Skipper Cliche in Treatment: 1 History of Present Illness HPI Description: 10/18/17-He is here for initial evaluation of bilateral lower extremity ulcerations in the presence of venous insufficiency and lymphedema. He has been seen by vascular medicine in the past, Dr. Lucky Cowboy, last seen in 2016. He does have a history of abnormal ABIs, which is to be expected given his lymphedema and venous insufficiency. According to Epic, it appears that all attempts for arterial evaluation and/or angiography were not follow through with by patient. He does have a history of being seen in lymphedema clinic in 2018, stopped going approximately 6 months ago stating "it didn't do any good". He does not have lymphedema pumps, he does not have custom fit compression wrap/stockings. He is diabetic and his recent A1c  last month was 7.6. He admits to chronic bilateral lower extremity pain, no change in pain since blister and ulceration development. He is currently being treated with Levaquin for bronchitis. He has home health and we will continue. 10/25/17-He is here in follow-up evaluation for bilateral lower extremity ulcerationssubtle he remains on Levaquin for bronchitis. Right lower extremity with no evidence of drainage or ulceration, persistent left lower extremity ulceration. He states that home health has not been out since his appointment. He went to [facility] on Tuesday, studies revealed: RIGHT ABI 0.9, TBI 0.6 LEFT ABI 1.1, TBI 0.6 with triphasic flow bilaterally. We will continue his same treatment plan. He has been educated on compression therapy and need for elevation. He will benefit from lymphedema pumps 11/01/17-He is here in follow-up evaluation for left lower extremity ulcer. The right lower extremity remains healed. He has home health services, but they have not been out to see the patient for 2-3 weeks. He states it home health physical therapy changed his dressing yesterday after therapy; he placed Ace wrap compression. We are still waiting for lymphedema pumps, reordered d/t need for company change. 11/08/17-He is here in follow-up evaluation for left lower extremity ulcer. It is improved. Edema is significantly improved with compression therapy. We will continue with same treatment plan and he will follow-up next week. No word regarding lymphedema pumps 11/15/17-He is here in follow-up evaluation for left lower extremity ulcer. He is healed and will be discharged from wound care services. I have reached out to [facility] regarding his lymphedema pumps. They have been unable to reach the patient; the contact number they had with the patient's wife's cell phone and she has not answered any unrecognized calls. Contact should be made today, trial planned for next  week; [facility] will continue to follow 11/27/17 on evaluation today patient has multiple blistered areas over the  right lower extremity his left lower extremity appears to be doing okay. These blistered areas show signs of no infection which is great news. With that being said he did have some necrotic skin overlying which was mechanically debrided away with saline and gauze today without complication. Overall post debridement the wounds appear to be doing better but in general his swelling seems to be increased. This is obviously not good news. I think this is what has given rise to the blisters. 12/04/17 on evaluation today patient presents for follow-up concerning his bilateral lower extremity edema in the right lower extremity ulcers. He has been tolerating the dressing changes without complication. With that being said he has had no real issues with the wraps which is also good news. Overall I'm pleased with the progress he's been making. 12/11/17 on evaluation today patient appears to be doing rather well in regard to his right lateral lower extremity ulcer. He's been tolerating the dressing changes without complication. Fortunately there does not appear to be any evidence of infection at this time. Overall I'm pleased with the progress that is being made. Unfortunately he has been in the hospital due to having what sounds to be a stomach virus/flu fortunately that is starting to get better. 12/18/17 on evaluation today patient actually appears to be doing very well in regard to his bilateral lower extremities the swelling is under fairly good control his lymphedema pumps are still not up and running quite yet. With that being said he does have several areas of opening noted as far as wounds are concerned mainly over the left lower extremity. With that being said I do believe once he gets lymphedema pumps this would least help you mention some the fluid and preventing this from occurring.  Hopefully that will be set up soon sleeves are Artie in place at his home he just waiting for the machine. 12/25/17 on evaluation today patient actually appears to be doing excellent in fact all of his ulcers appear to have resolved his legs appear very well. I do think he needs compression stockings we have discussed this and they are actually going to go to [facility] today to elastic therapy to get this fitted for him. I think that is definitely a good thing to do. Readmission: 04/09/18 upon evaluation today this patient is seen for readmission due to bilateral lower extremity lymphedema. He has significant swelling of his extremities especially on the left although the right is also swollen he has weeping from both sides. There are no obvious open wounds at this point. Fortunately he has been doing fairly well for quite a bit of time since I last saw him. Nonetheless unfortunately this seems to have reopened and is giving quite a bit of trouble. He states this began about a week ago when he first called [place] to get in to be seen. No fevers, chills, nausea, or vomiting noted at this time. He has not been using his lymphedema pumps due to the fact that they won't fit on his leg at this point likewise is also not been using his compression for essentially the same reason. 04/16/18 upon evaluation today patient actually appears to be doing a little better in regard to the fluid in his bilateral lower extremities. With that being said he's had three falls since I saw him last week. He also states that he's been feeling very poorly. I was concerned last week and feel like that the concern is still there as far as the  congestion in his chest is concerned he seems to be breathing about the same as last week but again he states he's very weak he's not even able to walk further than from the chair to the door. His wife had to buy a wheelchair just to be able to get them out of the house to get to the appointment  today. This has me very concerned. 04/23/18 on evaluation today patient actually appears to be doing much better than last week's evaluation. At that time actually had to transport him to the ER via EMS and he subsequently was admitted for acute pulmonary edema, acute renal failure, and acute congestive heart failure. Fortunately he is doing much better. Apparently they did dialyze him and were able to take off roughly 35 pounds of fluid. Nonetheless he is feeling much better both in regard to his breathing and he's able to get around much better at this time compared to previous. Overall I'm very KAJUAN, GUYTON ([id number]) happy with how things are at this time. There does not appear to be any evidence of infection currently. No fevers, chills, nausea, or vomiting noted at this time. 04/30/2018 patient seen today for follow-up and management of bilateral lower extremity lymphedema. He did express being more sad today than usual due to the recent loss of his dog. He states that he has been compliant with using the lymphedema pumps. However he does admit a minute over the last 2-3 days he has not been using the pumps due to the recent loss of his dog. At this time there is no drainage or open wounds to his lower extremities. The left leg edema is measuring smaller today. Still has a significant amount of edema on bilateral lower extremities With dry flaky skin. He will be referred to the lymphedema clinic for further management. Will continue 3 layer compression wraps and follow-up in 1-2 weeks.Denies any pain, fever, chairs recently. No recent falls or injuries reported during this visit. 05/07/18 on evaluation today patient actually appears to be doing very well in regard to his lower extremities in general all things considered. With that being said he is having some pain in the legs just due to the amount of swelling. He does have an area where he had a blister on the left lateral lower extremity  this is open at this point other than that there's nothing else weeping at this time. 05/14/18 on evaluation today patient actually appears to be doing excellent all things considered in regard to his lower extremities. He still has a couple areas of weeping on each leg which has continued to be the issue for him. He does have an appointment with the lymphedema clinic although this isn't until February 2020. That was the earliest they had. In the meantime he has continued to tolerate the compression wraps without complication. 05/28/18 on evaluation today patient actually appears to be doing more poorly in regard to his left lower extremity where he has a wound open at this point. He also had a fall where he subsequently injured his right great toe which has led to an open wound at the site unfortunately. He has been tolerating the dressing changes without complication in general as far as the wraps are concerned that he has not been putting any dressing on the left 1st toe ulcer site. 06/11/18 on evaluation today patient appears to be doing much worse in regard to his bilateral lower extremity ulcers. He has been tolerating the dressing changes without  complication although his legs have not been wrapped more recently. Overall I am not very pleased with the way his legs appear. I do believe he needs to be back in compression wraps he still has not received his compression wraps from the [facility] hospital as of yet. 06/18/18 on evaluation today patient actually appears to be doing significantly better than last time I saw him. He has been tolerating the compression wraps without complication in the circumferential ulcers especially appear to be doing much better. His toe ulcer on the right in regard to the great toe is better although not as good as the legs in my pinion. No fevers chills noted 07/02/18 on evaluation today patient appears to be doing much better in regard to his lower extremity ulcers.  Unfortunately since I last saw him he's had the distal portion of his right great toe if he dated it sounds as if this actually went downhill very quickly. I had only seen him a few days prior and the toe did not appear to be infected at that point subsequently became infected very rapidly and it was decided by the surgeon that the distal portion of the toe needed to be removed. The patient seems to be doing well in this regard he tells me. With that being said his lower extremities are doing better from the standpoint of the wounds although he is significantly swollen at this point. 07/09/18 on evaluation today patient appears to be doing better in regard to the wounds on his lower extremities. In fact everything is almost completely healed he is just a small area on the left posterior lower extremity that is open at this point. 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 With that being said he needs some kind of compression it does sound like he may not be wearing his compression, that is the wraps, during the entire time between when he's here visit to visit. Apparently his wife took the current one off because it began to "fall apart". 07/16/18 on evaluation today patient appears to be extremely swollen especially in regard to his left lower extremity unfortunately. He also has a new skin tear over the left lower extremity and there's a smaller area on the right lower extremity as well. Unfortunately this seems to be due in part to blistering and fluid buildup in his leg. He did get the reduction wraps that were ordered by the [facility] hospital for him to go to lymphedema clinic. With that being said his wounds on the legs have not healed to the point to where they would likely accept them as a patient lymphedema clinic  currently. We need to try to get this to heal. With that being said he's been taking his wraps off which is not doing him any favors at this point. In fact this is probably quite counterproductive compared to what needs to occur. We will likely need to increase to a four layer compression wrap and continue to also utilize elevation and he has to keep the wraps on not take them off as he's been doing currently hasn't had a wrap on since Saturday. 07/23/18 on evaluation today patient appears to be doing much better in regard to his bilateral lower extremities. In fact his left lower extremity which was the largest is actually 15 cm smaller today  compared to what it was last time he was here in our clinic. This is obviously good news after just one week. Nonetheless the differences he actually kept the wraps on during the entire week this time. That's not typical for him. I do believe he understands a little bit better now the severity of the situation and why it's important for him to keep these wraps on. 07/30/18 on evaluation today patient actually appears to be doing rather well in regard to his lower extremities. His legs are much smaller than they have been in the past and he actually has only one very small rudder superficial region remaining that is not closed on the left lateral/posterior lower extremity even this is almost completely close. I do believe likely next week he will be healed without any complications. I do think we need to continue the wraps however this seems to be beneficial for him. I also think it may be a good time for [place] to go ahead and see about getting the appointment with the lymphedema clinic which is supposed to be made for him in order to keep this moving along and hopefully get them into compression wraps that will in the end help him to remain healed. 08/06/18 on evaluation today patient actually appears to be doing very well in regard as bilateral lower extremities. In  fact his wounds appear to be completely healed at this time. 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 He is in the process of getting appointment with the lymphedema clinic we have made this referral were just waiting to hear back on the schedule time. We need to follow up on that today as well. 08/13/18 on evaluation today patient actually appears to be doing very well in regard to his bilateral lower extremities there are no open wounds at this point. We are gonna go ahead and see about ordering the Velcro compression wraps for him had a discussion with them about [place] doing it versus the [place] they feel like they can definitely afford going ahead and get the wraps themselves and they would prefer to try to avoid having to go to the lymphedema clinic if it all possible which I completely understand. As long as he has good compression I'm okay either way. 3/17//20 on evaluation today patient actually appears to be doing well in regard to his bilateral lower extremity ulcers. He has been tolerating the dressing changes without complication specifically the compression wraps. Overall is had no issues my fingers finding that I see at this point is that he is having trouble with constipation. He tells me he has not been able to go to the bathroom for about six days. He's taken to over-the- counter oral laxatives unfortunately this is not helping. He has not contacted his doctor. VASHAWN, EKSTEIN ([id number]) 08/27/18 on evaluation today patient appears to be doing fairly well in regard to his lower extremities at this point. There does not appear to be any new altars is swelling is very well controlled. We are still waiting for his Velcro compression wraps to arrive that should be sometime in the next week is Artie sent the check for these. 09/03/18 on evaluation today patient appears to be doing excellent in regard to his bilateral lower extremities  which he shows no signs of wound openings in regard to this point. He does have his Velcro compression wraps which did arrive in the mail since I saw him last  week. Overall he is doing excellent in my pinion. 09/13/18 on evaluation today patient appears to be doing very well currently in regard to the overall appearance of his bilateral lower extremities although he's a little bit more swollen than last time we saw him. At that point he been discharged without any open wounds. Nonetheless he has a small open wound on the posterior left lower extremity with some evidence of cellulitis noted as well. Fortunately I feel like he has made good progress overall with regard to his lower extremities from were things used to be. 09/20/18 on evaluation today patient actually appears to be doing much better. The erythematous lower extremity is improving wound itself which is still open appears to be doing much better as far as but appearance as well as pain is concerned overall very pleased in this regard. There's no signs of active infection at this time. 09/27/18 on evaluation today patient's wounds on the lower extremity actually appear to be doing fairly well at this time which is good news. There is no evidence of active infection currently and again is just as left lower extremity were there any wounds at this point anyway. I believe they may be completely healed but again I'm not 100% sure based on evaluation today. I think one more week of observation would likely be a good idea. 10/04/18 on evaluation today patient actually appears to be doing excellent in regard to the left lower extremity which actually appears to be completely healed as of today. Unfortunately he's been having issues with his right lower extremity have a new wound that has opened. Fortunately there's no evidence of active infection at this time which is good news. 10/10/18 upon evaluation today patient actually appears to be doing a  little bit worse with the new open area on his right posterior lower extremity. He's been tolerating the dressing changes without complication. Right now we been using his compression wraps although I think we may need to switch back to actually performing bilateral compression wraps are in the clinic. No fevers, chills, nausea, or vomiting noted at this time. 10/17/18 on evaluation today patient actually appears to be doing quite well in regard to his bilateral lower extremity ulcers. He has been tolerating the reps without complication although he would prefer not to rewrap his legs as of today. Fortunately there's no signs of active infection which is good news. No fevers, chills, nausea, or vomiting noted at this time. 10/24/18 on evaluation today patient appears to be doing very well in regard to his lower extremities. His right lower extremity is shown signs of healing and his left lower [place] though not healed appears to be improving which is excellent news. Overall very pleased with how things seem to be progressing at this time. The patient is likewise happy to hear this. His Velcro compression wraps however have not been put on properly will gonna show his wife how to do that properly today. 10/31/18 on evaluation today patient appears to be doing more poorly in regard to his left lower extremity in particular although both lower extremities actually are showing some signs of being worse than my previous evaluation. Unfortunately I'm just not sure that his compression stockings even with the use of the compression/lymphedema pumps seem to be controlling this well. Upon further questioning he tells me that he also is not able to lie flat in the bed due to his congestive heart failure and difficulty breathing. For that reason he sleeps in  his recliner. To make matters worse his recliner also cannot even hold his legs up so instead of even being somewhat elevated they pretty much hang down to the  floor. This is the way he sleeps each night which is definitely counterproductive to everything else that were attempting to do from the standpoint of controlling his fluid. Nonetheless I think that he potentially could benefit from a hospital bed although this would be something that his primary care provider would likely have to order since anything that is order on our side has to be directly related to wound care and again the hospital bed is not necessarily a direct relation to although I think it does contribute to his overall wound status on his lower extremities. 11/07/18 on evaluation today patient appears to be doing better in regard to his bilateral lower extremity. He's been tolerating the dressing changes without complication. We did in the interim since I last saw him switch to just using extras orbiting new alginate and that seems to have done much better for him. I'm very pleased with the overall progress that is made. 11/14/18 on evaluation today patient appears to be redoing rather well in regard to his left lower extremity ulcers which are the only ones remaining at this point. Fortunately there's no signs of active infection at this time which is good news. Overall been very pleased with how things seem to be progressing currently. No fevers, chills, nausea, or vomiting noted at this time. 11/20/17 on evaluation today patient actually appears to be doing quite well in regard to his lower extremities on the left on the right he has several blisters that showed up although there's some question about whether or not he has had his broker compression wraps on like he was supposed to or not. Fortunately there's no signs of active infection at this time which is good news. Unfortunately though he is doing well from the standpoint of the left leg the right leg is not doing as well again this is when we did not wrap last week. 11/28/18 on evaluation today patient appears to be doing well in  regard to his bilateral lower extremities is left appears to be healed is right is not healed but is very close to being so. Overall very pleased with how things seem to be progressing. Patient is likewise happy that things are doing well. 12/09/18 on evaluation today patient actually appears to be completely healed which is excellent news. He actually seems to be doing well in regard to the swelling of the bilateral lower extremities which is also great news. Overall very pleased with how things seem to be progressing. Readmission: 03/18/2019 upon evaluation today patient presents for reevaluation concerning issues that he is having with his left lower extremity where he does have a wound noted upon inspection today. He also has a wound on the right second toe on the tip where it is apparently been rubbing in his shoe I did look at issues and you can actually see where this has been occurring as well. Fortunately there is no signs of infection with regard to the toe necessarily although it is very swollen compared to normal that does have me somewhat concerned about the possibility of further evaluation for infection/osteomyelitis. I would recommend an x-ray to start with. Otherwise he states that it is been 1-2 weeks that he has had the draining in regard to left lower extremity he does not know how this happened he has been wearing  his compression appropriately and I do see that as well today but nonetheless I do think that he needs to continue to be very cautious with regard to elevation as well. LOGEN, HEINTZELMAN ([id number]) 03/25/2019 on evaluation today patient appears to be doing much better in regard to his lower extremity at this time. That is on the left. He did culture positive for Pseudomonas but the good news is he seems to be doing much better the gentamicin we applied topically seems to have done a great job for him. There is no signs of active infection at this time systemically and  locally he is doing much better. No fevers, chills, nausea, vomiting, or diarrhea. In regard to his right toe this seems to be doing much better there is very little pressure noted at this point I did clean up some of the callus today around the edges of the wound as well as the surface of the wound but to be honest he is progressing quite nicely. 10/30; the area on the left anterior lower tibia area is healed over. His edema control is adequate even though his use of the compression pumps seems very intermittent. He has a juxta lite stocking on the right leg and a think there is 1 for the left leg at home. His wife is putting these on. He has an area on the plantar right second toe which is a hammertoe they are trying to offload this 04/08/2019 on evaluation today patient appears to be doing well in regard to his lower extremities which is good news. We are seeing him today for his toe ulcer which is giving him a little bit more trouble but again still seems to be doing better at this point which is good news. He is going require some sharp debridement in regard to the toe today however. 04/15/2019 on evaluation today patient actually appears to be doing quite well with regard to his toe ulcer. I am very pleased with how things are doing in that regard. With regard to his lower extremity edema in general he tells me he is not been using the lymphedema pumps regularly although he does not use them. I think that he needs to use them more regularly he does have a small blister on the left lower extremity this is not open at this point but obviously it something that can get worse if he does not keep the compression going and the pumps going as well. 04/22/2019 on evaluation today patient appears to be doing well with regard to his toe ulcer. He has been tolerating the dressing changes without complication. This is measuring slightly smaller this week as compared to last week. Fortunately there is no signs  of active infection at this time. 05/06/2019 on evaluation today patient actually appears to be doing excellent. In fact his toe appears to be completely healed which is great news. There is no signs of active infection at this time. Readmission: Patient presents today for follow-up after having been discharged at the beginning of December 2020. He states that in recent weeks things have reopened and has been having more trouble at this point. With that being said fortunately there is no signs of active infection at this time. No fever chills noted. Nonetheless he also does have an issue with one of his toes as well that seems to be somewhat this is on the right foot second toe. 07/25/2019 upon evaluation today patient's lower extremities appear to be doing excellent bilaterally. I  really feel like he is showing signs of great improvement and in fact I think he is close to healing which is great news. There is no signs of active infection at this time. No fevers, chills, nausea, vomiting, or diarrhea. 07/31/2019 upon evaluation today patient appears to be doing excellent and in fact I think may be completely healed in regard to his right lower extremity that were still to monitor this 1 more week before closing it out. The left lower extremity is measuring smaller although not completely closed seems to be doing excellent. 08/07/2019 upon evaluation today patient appears to be doing excellent in regard to his wounds. In fact the right lower extremity is completely healed there is no issues here. The left lower extremity has 1 very small area still remaining fortunately there is no signs of infection and overall I think he is very close to closure of the here as well. 08/14/19 upon evaluation today patient appears to be doing excellent in regard to his lower extremities. In fact he appears to be completely healed as of today. Fortunately there's no signs of active infection at this time. No fevers,  chills, nausea, or vomiting noted at this time. READMISSION 09/26/2019 This is a 75 year old man who is well-known to this clinic having just been discharged on 08/14/2019. He has known lymphedema and chronic venous insufficiency. He has Farrow wrap stockings and also external compression pumps. He does not use the external compression pumps however. He does however apparently fairly faithfully use the stockings. They have not been lotion in his legs. He has developed new wounds on the right anterior as well as the left medial left lateral and left posterior calf. He returns for our review of this Past medical history includes coronary artery disease, hypertension, congestive heart failure, COPD, venous insufficiency with lymphedema, type 2 diabetes. He apparently has compression pumps, Farrow wraps and at 1 point a hospital bed although I believe he sleeps in a recliner 10/03/2019 upon evaluation today patient appears to be doing better with regard to his wounds in general. He has been tolerating the dressing changes without complication. Fortunately there is no signs of active infection. No fevers, chills, nausea, vomiting, or diarrhea. I do believe the compression wraps are helping as they always have in the past. 10/10/2019 upon evaluation today patient appears to be doing excellent at this point. His right lower extremity is measuring much better and in fact is completely healed. His left lower extremity is also measuring better though not healed seems to be doing excellent at this point which is great news. Overall I am very pleased with how things appear currently. 10/27/2019 upon evaluation today patient appears to be doing quite well with regard to his wounds currently. He has been tolerating the dressing changes without complication. Fortunately there is no signs of active infection at this time. In fact he is almost completely healed on the left and has just a very small area open and on the  right he is completely healed. 11/04/2019 upon evaluation today patient appears to be doing quite well with regard to his wounds. In fact he appears to be quite possibly completely healed on the left lower extremity now as well the right lower extremity is still doing well. With that being said unfortunately though this may be healed I think it is a very fragile area and I cannot even confirm there is not a very small opening still remaining. I really think he may benefit from 1  additional weeks wrapping before discontinuing. 11/10/2019 upon evaluation today patient appears to be doing well in regard to his original wounds in fact everything that we were treating last week is completely healed on the left. Unfortunately he has a new skin tear just above where the wrap slipped down due to a fall he sustained causing this injury. 11/17/2019 on evaluation today patient appears to be doing better with regard to his wound. He has been tolerating the dressing changes without [place], [place]. ([id number]) complication. Fortunately there is no signs of active infection at this time. No fevers, chills, nausea, vomiting, or diarrhea. 11/24/2019 upon evaluation today patient appears to be doing well with regard to his wound. This is making good progress there does not appear to be signs of active infection but overall I do feel like he is headed in the correct direction. Overall he is tolerating the compression wrap as well. 12/02/2019 upon evaluation today patient appears to be doing well with regard to his leg ulcer. In fact this appears to be completely healed and this is the last of the wounds that he had but still the left anterior lower extremity. Fortunately there is no signs of active infection at this time. No fevers, chills, nausea, vomiting, or diarrhea. Readmission: 02/05/2020 on evaluation today patient appears to be doing well with regard to his wound all things considered. He actually tells me that a  couple weeks ago he had trouble with his wraps and therefore took him off for a couple of days in order to give his legs a break. It was during this time that he actually developed increased swelling and edema of his legs and blisters on both legs. That is when he called to make the appointment. Nonetheless since that time he began wearing his compression wraps which are Velcro in the meantime and has done extremely well with this. In fact his leg appears to be under good control as far as edema is concerned today it is nothing like what I am seeing when he has come in for evaluations in the past. They have been using some of the silver alginate at home which has been beneficial for him. Fortunately there is no signs of active infection at this time. No fevers, chills, nausea, vomiting, or diarrhea. 02/19/2020 on evaluation today patient unfortunately does not appear to be doing quite as well as I would like to see. He has no signs of active infection at this time but unfortunately he is not doing well in regard to the overall appearance of his left leg. He has a couple other areas that are weeping now and the leg is much more swollen. I think that he needs to actually have a compression wrap to try to manage this. 02/26/2020 on evaluation today patient appears to be doing well with regard to his left lower extremity ulcer. Fortunately the swelling is down I do believe the pressure wrap seems to be doing quite well. Fortunately there is no signs of active infection at this time. 03/05/2020 upon evaluation today patient appears to be doing excellent in regard to his legs at this point. Fortunately there is no signs of active infection which is great news. Overall he feels like he is completely healed which is great news. Readmission: 05/20/2020 upon evaluation today patient appears to be doing well with regard to his leg ulcer all things considered. He is actually coming back to see [place] after having had a  reopening of the ulcers on  his left leg in the past several weeks. He is out of dressing supplies and his wife is been try to take care of this as best she can. 06/10/2020 upon evaluation today patient unfortunately appears to be doing significantly worse today compared to when I last saw him. He and his wife both tell me that "there has been a lot going on". I did not actually go into detail about everything that has been happening but nonetheless he has not obviously been wearing his compression. He brings them with him today but they were not on. I am not even certain to be honest that he could get those on at this point. Nonetheless I do believe that he is going to require compression wrapping at some point shortly but right now we need to try to get what appears to be infection under control at this time. 06/28/2020 upon evaluation today patient appears to be doing well with regard to 06/28/2020 upon evaluation today patient appears to be doing well pretty well in regard to his left leg which is much better than previous. With that being said in regard to his right leg he does seem to be having some issues with a new wound after he sustained a fall. This fortunately is not too deep but does appear to be something we need to address. Neither deep which is good. 07/05/2020 on evaluation today patient appears to be doing well with regard to his wounds. In fact everything on the left leg appears to be pretty much healed on the right leg this is very close though not completely closed yet. Fortunately there is no evidence of active infection at this time. No fevers, chills, nausea, vomiting, or diarrhea. 07/13/2019 upon evaluation today patient appears to be making good progress which is great news. Overall I am extremely pleased with where things stand today. There does not appear to be any signs of active infection which is great news and overall his left leg appears healed still the right leg though not  healed does seem to be making good progress which is excellent news. He is having no pain. 07/19/2020 on evaluation today patient appears to be doing well with regard to his wound. He has been tolerating the dressing changes without complication. Fortunately there does not appear to be any signs of active infection at this time. No fevers, chills, nausea, vomiting, or diarrhea. Readmission: 08/20/2020 on evaluation today patient presents for reevaluation here in the clinic concerning issues that he has been having with his bilateral lower extremities. Unfortunately this is an ongoing reoccurring issue. He tells me that he is really not certain exactly what caused the issue although as I discussed this further with him it appears that he has been sitting for the most part and sleeping in his chair with his feet on the ground he is not really elevating at all in fact the chair does not even have a reclining function therefore it is basically just him sitting with his feet on the ground. I think this alone has probably led to his increased swelling he is also having more difficulty walking which means that he is pumping less blood flow through his legs anyway as far as the venous stasis is concerned. All in all I think this is leading to a downward spiral of him not being able to walk very well as his legs are so heavy, they begin to weep, and overall he just does not seem to be doing as well  as it was when I last saw him. 08/27/2020 upon evaluation today patient's legs actually are showing signs of improvement which is great news. There does not appear to be any evidence of infection which is also excellent news. I am extremely pleased with where things stand today. No fevers, chills, nausea, vomiting, or diarrhea. Electronic Signature(s) Signed: 08/27/2020 11:25:30 AM By: Worthy Keeler PA-C Entered By: Worthy Keeler on 08/27/2020 11:25:30 Adam Merritt  ([id number]) -------------------------------------------------------------------------------- Physical Exam Details Patient Name: Adam Merritt Date of Service: 08/27/2020 10:15 AM Medical Record Number: [id number] Patient Account Number: [id number] Date of Birth/Sex: 06-27-1945 (75 y.o. M) Treating RN: Carlene Coria Primary Care Provider: [name] Other Clinician: Jeanine Luz Referring Provider: [name] Treating Provider/Extender: Jeri Cos [name]: 1 Constitutional Well-nourished and well-hydrated in no acute distress. Respiratory normal breathing without difficulty. Psychiatric this patient is able to make decisions and demonstrates good insight into disease process. Alert and Oriented x 3. pleasant and cooperative. Notes Patient's wound bed showed signs of good granulation and epithelization at this point. There does not appear to be any evidence of infection which is great news and overall I am very pleased with where things stand. No fevers, chills, nausea, vomiting, or diarrhea. Electronic Signature(s) Signed: 08/27/2020 11:25:45 AM By: Worthy Keeler PA-C Entered By: Worthy Keeler on 08/27/2020 11:25:45 Adam Merritt ([id number]) -------------------------------------------------------------------------------- Physician Orders Details Patient Name: Adam Merritt Date of Service: 08/27/2020 10:15 AM Medical Record Number: [id number] Patient Account Number: [id number] Date of Birth/Sex: 07/23/1945 (75 y.o. M) Treating RN: Carlene Coria Primary Care Provider: [name] Other Clinician: Jeanine Luz Referring Provider: [name] Treating Provider/Extender: Skipper Cliche in Treatment: 1 Verbal / Phone Orders: No Diagnosis Coding ICD-10 Coding Code Description I89.0 Lymphedema, not elsewhere classified I87.2 Venous insufficiency (chronic) (peripheral) Follow-up Appointments o Return Appointment in 1 week. Bathing/ Shower/ Hygiene o  May shower with wound dressing protected with water repellent cover or cast protector. Edema Control - Lymphedema / Segmental Compressive Device / Other o 4 Layer Compression System (Left, Right, Bilateral) Lymphedema. - apply xsorb, bi lat lower extrimities o Elevate, Exercise Daily and Avoid Standing for Long Periods of Time. o Elevate legs to the level of the heart and pump ankles as often as possible o Elevate leg(s) parallel to the floor when sitting. Electronic Signature(s) Signed: 08/27/2020 2:05:44 PM By: Worthy Keeler PA-C Signed: 08/30/2020 5:08:01 PM By: Carlene Coria RN Entered By: Carlene Coria on 08/27/2020 11:02:30 Adam Merritt ([id number]) -------------------------------------------------------------------------------- Problem List Details Patient Name: Adam Merritt Date of Service: 08/27/2020 10:15 AM Medical Record Number: [id number] Patient Account Number: [id number] Date of Birth/Sex: 22-Feb-1946 (75 y.o. M) Treating RN: Carlene Coria Primary Care Provider: [name] Other Clinician: Jeanine Luz Referring Provider: [name] Treating Provider/Extender: Skipper Cliche in Treatment: 1 Active Problems ICD-10 Encounter Code Description Active Date MDM Diagnosis I89.0 Lymphedema, not elsewhere classified 08/20/2020 No Yes I87.2 Venous insufficiency (chronic) (peripheral) 08/20/2020 No Yes Inactive Problems Resolved Problems Electronic Signature(s) Signed: 08/27/2020 10:25:27 AM By: Worthy Keeler PA-C Entered By: Worthy Keeler on 08/27/2020 10:25:27 Adam Merritt ([id number]) -------------------------------------------------------------------------------- Progress Note Details Patient Name: Adam Merritt Date of Service: 08/27/2020 10:15 AM Medical Record Number: [id number] Patient Account Number: [id number] Date of Birth/Sex: [birth year] (75 y.o. M) Treating RN: Carlene Coria Primary Care Provider: [name] Other Clinician: Jeanine Luz Referring Provider: [name] Treating Provider/Extender: Skipper Cliche in Treatment: 1 Subjective Chief Complaint  Information obtained from Patient Bilateral LE Ulcers History of Present Illness (HPI) 10/18/17-He is here for initial evaluation of bilateral lower extremity ulcerations in the presence of venous insufficiency and lymphedema. He has been seen by vascular medicine in the past, Dr. Lucky Cowboy, last seen in 2016. He does have a history of abnormal ABIs, which is to be expected given his lymphedema and venous insufficiency. According to Epic, it appears that all attempts for arterial evaluation and/or angiography were not follow through with by patient. He does have a history of being seen in lymphedema clinic in 2018, stopped going approximately 6 months ago stating "it didn't do any good". He does not have lymphedema pumps, he does not have custom fit compression wrap/stockings. He is diabetic and his recent A1c last month was 7.6. He admits to chronic bilateral lower extremity pain, no change in pain since blister and ulceration development. He is currently being treated with Levaquin for bronchitis. He has home health and we will continue. 10/25/17-He is here in follow-up evaluation for bilateral lower extremity ulcerationssubtle he remains on Levaquin for bronchitis. Right lower extremity with no evidence of drainage or ulceration, persistent left lower extremity ulceration. He states that home health has not been out since his appointment. He went to [facility] on Tuesday, studies revealed: RIGHT ABI 0.9, TBI 0.6 LEFT ABI 1.1, TBI 0.6 with triphasic flow bilaterally. We will continue his same treatment plan. He has been educated on compression therapy and need for elevation. He will benefit from lymphedema pumps 11/01/17-He is here in follow-up evaluation for left lower extremity ulcer. The right lower extremity remains healed. He has home health services, but  they have not been out to see the patient for 2-3 weeks. He states it home health physical therapy changed his dressing yesterday after therapy; he placed Ace wrap compression. We are still waiting for lymphedema pumps, reordered d/t need for company change. 11/08/17-He is here in follow-up evaluation for left lower extremity ulcer. It is improved. Edema is significantly improved with compression therapy. We will continue with same treatment plan and he will follow-up next week. No word regarding lymphedema pumps 11/15/17-He is here in follow-up evaluation for left lower extremity ulcer. He is healed and will be discharged from wound care services. I have reached out to [facility] regarding his lymphedema pumps. They have been unable to reach the patient; the contact number they had with the patient's wife's cell phone and she has not answered any unrecognized calls. Contact should be made today, trial planned for next week; [facility] will continue to follow 11/27/17 on evaluation today patient has multiple blistered areas over the right lower extremity his left lower extremity appears to be doing okay. These blistered areas show signs of no infection which is great news. With that being said he did have some necrotic skin overlying which was mechanically debrided away with saline and gauze today without complication. Overall post debridement the wounds appear to be doing better but in general his swelling seems to be increased. This is obviously not good news. I think this is what has given rise to the blisters. 12/04/17 on evaluation today patient presents for follow-up concerning his bilateral lower extremity edema in the right lower extremity ulcers. He has been tolerating the dressing changes without complication. With that being said he has had no real issues with the wraps which is also good news. Overall I'm pleased with the progress he's been making. 12/11/17 on evaluation today  patient appears to be doing rather well in regard to his right lateral lower extremity ulcer. He's been tolerating the dressing changes without complication. Fortunately there does not appear to be any evidence of infection at this time. Overall I'm pleased with the progress that is being made. Unfortunately he has been in the hospital due to having what sounds to be a stomach virus/flu fortunately that is starting to get better. 12/18/17 on evaluation today patient actually appears to be doing very well in regard to his bilateral lower extremities the swelling is under fairly good control his lymphedema pumps are still not up and running quite yet. With that being said he does have several areas of opening noted as far as wounds are concerned mainly over the left lower extremity. With that being said I do believe once he gets lymphedema pumps this would least help you mention some the fluid and preventing this from occurring. Hopefully that will be set up soon sleeves are Artie in place at his home he just waiting for the machine. 12/25/17 on evaluation today patient actually appears to be doing excellent in fact all of his ulcers appear to have resolved his legs appear very well. I do think he needs compression stockings we have discussed this and they are actually going to go to [place] today to elastic therapy to get this fitted for him. I think that is definitely a good thing to do. Readmission: 04/09/18 upon evaluation today this patient is seen for readmission due to bilateral lower extremity lymphedema. He has significant swelling of his extremities especially on the left although the right is also swollen he has weeping from both sides. There are no obvious open wounds at this point. Fortunately he has been doing fairly well for quite a bit of time since I last saw him. Nonetheless unfortunately this seems to have reopened and is giving quite a bit of trouble. He states this began about a week  ago when he first called [place] to get in to be seen. No fevers, chills, nausea, or vomiting noted at this time. He has not been using his lymphedema pumps due to the fact that they won't fit on his leg at this point likewise is also not been using his compression for essentially the same reason. 04/16/18 upon evaluation today patient actually appears to be doing a little better in regard to the fluid in his bilateral lower extremities. With that being said he's had three falls since I saw him last week. He also states that he's been feeling very poorly. I was concerned last week and feel like that the concern is still there as far as the congestion in his chest is concerned he seems to be breathing about the same as last week but again he states he's very weak he's not even able to walk further than from the chair to the door. His wife had to buy a wheelchair just to be able to get them out of the house to get to the appointment today. This has me very concerned. KAVONTAE, PRITCHARD ([id number]) 04/23/18 on evaluation today patient actually appears to be doing much better than last week's evaluation. At that time actually had to transport him to the ER via EMS and he subsequently was admitted for acute pulmonary edema, acute renal failure, and acute congestive heart failure. Fortunately he is doing much better. Apparently they did dialyze him and were able to take off roughly 35 pounds of fluid. Nonetheless he is feeling  much better both in regard to his breathing and he's able to get around much better at this time compared to previous. Overall I'm very happy with how things are at this time. There does not appear to be any evidence of infection currently. No fevers, chills, nausea, or vomiting noted at this time. 04/30/2018 patient seen today for follow-up and management of bilateral lower extremity lymphedema. He did express being more sad today than usual due to the recent loss of his dog. He states that  he has been compliant with using the lymphedema pumps. However he does admit a minute over the last 2-3 days he has not been using the pumps due to the recent loss of his dog. At this time there is no drainage or open wounds to his lower extremities. The left leg edema is measuring smaller today. Still has a significant amount of edema on bilateral lower extremities With dry flaky skin. He will be referred to the lymphedema clinic for further management. Will continue 3 layer compression wraps and follow-up in 1-2 weeks.Denies any pain, fever, chairs recently. No recent falls or injuries reported during this visit. 05/07/18 on evaluation today patient actually appears to be doing very well in regard to his lower extremities in general all things considered. With that being said he is having some pain in the legs just due to the amount of swelling. He does have an area where he had a blister on the left lateral lower extremity this is open at this point other than that there's nothing else weeping at this time. 05/14/18 on evaluation today patient actually appears to be doing excellent all things considered in regard to his lower extremities. He still has a couple areas of weeping on each leg which has continued to be the issue for him. He does have an appointment with the lymphedema clinic although this isn't until February 2020. That was the earliest they had. In the meantime he has continued to tolerate the compression wraps without complication. 05/28/18 on evaluation today patient actually appears to be doing more poorly in regard to his left lower extremity where he has a wound open at this point. He also had a fall where he subsequently injured his right great toe which has led to an open wound at the site unfortunately. He has been tolerating the dressing changes without complication in general as far as the wraps are concerned that he has not been putting any dressing on the left 1st toe ulcer  site. 06/11/18 on evaluation today patient appears to be doing much worse in regard to his bilateral lower extremity ulcers. He has been tolerating the dressing changes without complication although his legs have not been wrapped more recently. Overall I am not very pleased with the way his legs appear. I do believe he needs to be back in compression wraps he still has not received his compression wraps from the [facility] as of yet. 06/18/18 on evaluation today patient actually appears to be doing significantly better than last time I saw him. He has been tolerating the compression wraps without complication in the circumferential ulcers especially appear to be doing much better. His toe ulcer on the right in regard to the great toe is better although not as good as the legs in my pinion. No fevers chills noted 07/02/18 on evaluation today patient appears to be doing much better in regard to his lower extremity ulcers. Unfortunately since I last saw him he's had the distal portion  of his right great toe if he dated it sounds as if this actually went downhill very quickly. I had only seen him a few days prior and the toe did not appear to be infected at that point subsequently became infected very rapidly and it was decided by the surgeon that the distal portion of the toe needed to be removed. The patient seems to be doing well in this regard he tells me. With that being said his lower extremities are doing better from the standpoint of the wounds although he is significantly swollen at this point. 07/09/18 on evaluation today patient appears to be doing better in regard to the wounds on his lower extremities. In fact everything is almost completely healed he is just a small area on the left posterior lower extremity that is open at this point. 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 With that being said he needs some kind of compression it does sound like he may not be wearing his compression, that is the wraps, during the entire time between when he's here visit to visit. Apparently his wife took the current one off because it began to "fall apart". 07/16/18 on evaluation today patient appears to be extremely swollen especially in regard to his left lower extremity unfortunately. He also has a new skin tear over the left lower extremity and there's a smaller area on the right lower extremity as well. Unfortunately this seems to be due in part to blistering and fluid buildup in his leg. He did get the reduction wraps that were ordered by the [facility] hospital for him to go to lymphedema clinic. With that being said his wounds on the legs have not healed to the point to where they would likely accept them as a patient lymphedema clinic currently. We need to try to get this to heal. With that being said he's been taking his wraps off which is not doing him any favors at this point. In fact this is probably quite counterproductive compared to what needs to occur. We will likely need to increase to a four layer compression wrap and continue to also utilize elevation and he has to keep the wraps on not take them off as he's been doing currently hasn't had a wrap on since Saturday. 07/23/18 on evaluation today patient appears to be doing much better in regard to his bilateral lower extremities. In fact his left lower extremity which was the largest is actually 15 cm smaller today compared to what it was last time he was here in our clinic. This is obviously good news after just one week. Nonetheless the differences he actually kept the wraps on during the entire week this time. That's not typical for him. I do believe he understands a little bit better now the severity of the situation and why it's important for him to keep these  wraps on. 07/30/18 on evaluation today patient actually appears to be doing rather well in regard to his lower extremities. His legs are much smaller than they have been in the past and he actually has only one very small rudder superficial region remaining that is not closed on the left lateral/posterior lower extremity even this is almost completely close. I do believe likely next week he will be healed without any complications. I do think we  need to continue the wraps however this seems to be beneficial for him. I also think it may be a good time for [place] to go ahead and see about getting the appointment with the lymphedema clinic which is supposed to be made for him in order to keep this moving along and hopefully get them into compression wraps that will in the end help him to remain healed. 08/06/18 on evaluation today patient actually appears to be doing very well in regard as bilateral lower extremities. In fact his wounds appear to be completely healed at this time. 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 He is in the process of getting appointment with the lymphedema clinic we have made this referral were just waiting to hear back on the schedule time. We need to follow up on that today as well. 08/13/18 on evaluation today patient actually appears to be doing very well in regard to his bilateral lower extremities there are no open wounds at this point. We are gonna go ahead and see about ordering the Velcro compression wraps for him had a discussion with them about [place] doing it versus the [place] they feel like they can definitely afford going ahead and get the wraps themselves and they would prefer to try to avoid having to go to the lymphedema clinic if it all possible which I completely understand. As long as he has good compression I'm okay either way. RORIK, VESPA ([id number]) 3/17//20 on evaluation today patient actually appears  to be doing well in regard to his bilateral lower extremity ulcers. He has been tolerating the dressing changes without complication specifically the compression wraps. Overall is had no issues my fingers finding that I see at this point is that he is having trouble with constipation. He tells me he has not been able to go to the bathroom for about six days. He's taken to over-the- counter oral laxatives unfortunately this is not helping. He has not contacted his doctor. 08/27/18 on evaluation today patient appears to be doing fairly well in regard to his lower extremities at this point. There does not appear to be any new altars is swelling is very well controlled. We are still waiting for his Velcro compression wraps to arrive that should be sometime in the next week is Artie sent the check for these. 09/03/18 on evaluation today patient appears to be doing excellent in regard to his bilateral lower extremities which he shows no signs of wound openings in regard to this point. He does have his Velcro compression wraps which did arrive in the mail since I saw him last week. Overall he is doing excellent in my pinion. 09/13/18 on evaluation today patient appears to be doing very well currently in regard to the overall appearance of his bilateral lower extremities although he's a little bit more swollen than last time we saw him. At that point he been discharged without any open wounds. Nonetheless he has a small open wound on the posterior left lower extremity with some evidence of cellulitis noted as well. Fortunately I feel like he has made good progress overall with regard to his lower extremities from were things used to be. 09/20/18 on evaluation today patient actually appears to be doing much better. The erythematous lower extremity is improving wound itself which is still open appears to be doing much better as far as but appearance as well as pain is concerned overall very pleased in this  regard.  There's no signs of active infection at this time. 09/27/18 on evaluation today patient's wounds on the lower extremity actually appear to be doing fairly well at this time which is good news. There is no evidence of active infection currently and again is just as left lower extremity were there any wounds at this point anyway. I believe they may be completely healed but again I'm not 100% sure based on evaluation today. I think one more week of observation would likely be a good idea. 10/04/18 on evaluation today patient actually appears to be doing excellent in regard to the left lower extremity which actually appears to be completely healed as of today. Unfortunately he's been having issues with his right lower extremity have a new wound that has opened. Fortunately there's no evidence of active infection at this time which is good news. 10/10/18 upon evaluation today patient actually appears to be doing a little bit worse with the new open area on his right posterior lower extremity. He's been tolerating the dressing changes without complication. Right now we been using his compression wraps although I think we may need to switch back to actually performing bilateral compression wraps are in the clinic. No fevers, chills, nausea, or vomiting noted at this time. 10/17/18 on evaluation today patient actually appears to be doing quite well in regard to his bilateral lower extremity ulcers. He has been tolerating the reps without complication although he would prefer not to rewrap his legs as of today. Fortunately there's no signs of active infection which is good news. No fevers, chills, nausea, or vomiting noted at this time. 10/24/18 on evaluation today patient appears to be doing very well in regard to his lower extremities. His right lower extremity is shown signs of healing and his left lower Trinity though not healed appears to be improving which is excellent news. Overall very pleased with how  things seem to be progressing at this time. The patient is likewise happy to hear this. His Velcro compression wraps however have not been put on properly will gonna show his wife how to do that properly today. 10/31/18 on evaluation today patient appears to be doing more poorly in regard to his left lower extremity in particular although both lower extremities actually are showing some signs of being worse than my previous evaluation. Unfortunately I'm just not sure that his compression stockings even with the use of the compression/lymphedema pumps seem to be controlling this well. Upon further questioning he tells me that he also is not able to lie flat in the bed due to his congestive heart failure and difficulty breathing. For that reason he sleeps in his recliner. To make matters worse his recliner also cannot even hold his legs up so instead of even being somewhat elevated they pretty much hang down to the floor. This is the way he sleeps each night which is definitely counterproductive to everything else that were attempting to do from the standpoint of controlling his fluid. Nonetheless I think that he potentially could benefit from a hospital bed although this would be something that his primary care provider would likely have to order since anything that is order on our side has to be directly related to wound care and again the hospital bed is not necessarily a direct relation to although I think it does contribute to his overall wound status on his lower extremities. 11/07/18 on evaluation today patient appears to be doing better in regard to his bilateral lower  extremity. He's been tolerating the dressing changes without complication. We did in the interim since I last saw him switch to just using extras orbiting new alginate and that seems to have done much better for him. I'm very pleased with the overall progress that is made. 11/14/18 on evaluation today patient appears to be redoing  rather well in regard to his left lower extremity ulcers which are the only ones remaining at this point. Fortunately there's no signs of active infection at this time which is good news. Overall been very pleased with how things seem to be progressing currently. No fevers, chills, nausea, or vomiting noted at this time. 11/20/17 on evaluation today patient actually appears to be doing quite well in regard to his lower extremities on the left on the right he has several blisters that showed up although there's some question about whether or not he has had his broker compression wraps on like he was supposed to or not. Fortunately there's no signs of active infection at this time which is good news. Unfortunately though he is doing well from the standpoint of the left leg the right leg is not doing as well again this is when we did not wrap last week. 11/28/18 on evaluation today patient appears to be doing well in regard to his bilateral lower extremities is left appears to be healed is right is not healed but is very close to being so. Overall very pleased with how things seem to be progressing. Patient is likewise happy that things are doing well. 12/09/18 on evaluation today patient actually appears to be completely healed which is excellent news. He actually seems to be doing well in regard to the swelling of the bilateral lower extremities which is also great news. Overall very pleased with how things seem to be progressing. Readmission: 03/18/2019 upon evaluation today patient presents for reevaluation concerning issues that he is having with his left lower extremity where he does have a wound noted upon inspection today. He also has a wound on the right second toe on the tip where it is apparently been rubbing in his shoe I did look at issues and you can actually see where this has been occurring as well. Fortunately there is no signs of infection with regard to MEHTAAB, MAYEDA. ([id number]) the  toe necessarily although it is very swollen compared to normal that does have me somewhat concerned about the possibility of further evaluation for infection/osteomyelitis. I would recommend an x-ray to start with. Otherwise he states that it is been 1-2 weeks that he has had the draining in regard to left lower extremity he does not know how this happened he has been wearing his compression appropriately and I do see that as well today but nonetheless I do think that he needs to continue to be very cautious with regard to elevation as well. 03/25/2019 on evaluation today patient appears to be doing much better in regard to his lower extremity at this time. That is on the left. He did culture positive for Pseudomonas but the good news is he seems to be doing much better the gentamicin we applied topically seems to have done a great job for him. There is no signs of active infection at this time systemically and locally he is doing much better. No fevers, chills, nausea, vomiting, or diarrhea. In regard to his right toe this seems to be doing much better there is very little pressure noted at this point I did clean up  some of the callus today around the edges of the wound as well as the surface of the wound but to be honest he is progressing quite nicely. 10/30; the area on the left anterior lower tibia area is healed over. His edema control is adequate even though his use of the compression pumps seems very intermittent. He has a juxta lite stocking on the right leg and a think there is 1 for the left leg at home. His wife is putting these on. He has an area on the plantar right second toe which is a hammertoe they are trying to offload this 04/08/2019 on evaluation today patient appears to be doing well in regard to his lower extremities which is good news. We are seeing him today for his toe ulcer which is giving him a little bit more trouble but again still seems to be doing better at this point which  is good news. He is going require some sharp debridement in regard to the toe today however. 04/15/2019 on evaluation today patient actually appears to be doing quite well with regard to his toe ulcer. I am very pleased with how things are doing in that regard. With regard to his lower extremity edema in general he tells me he is not been using the lymphedema pumps regularly although he does not use them. I think that he needs to use them more regularly he does have a small blister on the left lower extremity this is not open at this point but obviously it something that can get worse if he does not keep the compression going and the pumps going as well. 04/22/2019 on evaluation today patient appears to be doing well with regard to his toe ulcer. He has been tolerating the dressing changes without complication. This is measuring slightly smaller this week as compared to last week. Fortunately there is no signs of active infection at this time. 05/06/2019 on evaluation today patient actually appears to be doing excellent. In fact his toe appears to be completely healed which is great news. There is no signs of active infection at this time. Readmission: Patient presents today for follow-up after having been discharged at the beginning of December 2020. He states that in recent weeks things have reopened and has been having more trouble at this point. With that being said fortunately there is no signs of active infection at this time. No fever chills noted. Nonetheless he also does have an issue with one of his toes as well that seems to be somewhat this is on the right foot second toe. 07/25/2019 upon evaluation today patient's lower extremities appear to be doing excellent bilaterally. I really feel like he is showing signs of great improvement and in fact I think he is close to healing which is great news. There is no signs of active infection at this time. No fevers, chills, nausea, vomiting, or  diarrhea. 07/31/2019 upon evaluation today patient appears to be doing excellent and in fact I think may be completely healed in regard to his right lower extremity that were still to monitor this 1 more week before closing it out. The left lower extremity is measuring smaller although not completely closed seems to be doing excellent. 08/07/2019 upon evaluation today patient appears to be doing excellent in regard to his wounds. In fact the right lower extremity is completely healed there is no issues here. The left lower extremity has 1 very small area still remaining fortunately there is no signs of infection  and overall I think he is very close to closure of the here as well. 08/14/19 upon evaluation today patient appears to be doing excellent in regard to his lower extremities. In fact he appears to be completely healed as of today. Fortunately there's no signs of active infection at this time. No fevers, chills, nausea, or vomiting noted at this time. READMISSION 09/26/2019 This is a 75 year old man who is well-known to this clinic having just been discharged on 08/14/2019. He has known lymphedema and chronic venous insufficiency. He has Farrow wrap stockings and also external compression pumps. He does not use the external compression pumps however. He does however apparently fairly faithfully use the stockings. They have not been lotion in his legs. He has developed new wounds on the right anterior as well as the left medial left lateral and left posterior calf. He returns for our review of this Past medical history includes coronary artery disease, hypertension, congestive heart failure, COPD, venous insufficiency with lymphedema, type 2 diabetes. He apparently has compression pumps, Farrow wraps and at 1 point a hospital bed although I believe he sleeps in a recliner 10/03/2019 upon evaluation today patient appears to be doing better with regard to his wounds in general. He has been tolerating  the dressing changes without complication. Fortunately there is no signs of active infection. No fevers, chills, nausea, vomiting, or diarrhea. I do believe the compression wraps are helping as they always have in the past. 10/10/2019 upon evaluation today patient appears to be doing excellent at this point. His right lower extremity is measuring much better and in fact is completely healed. His left lower extremity is also measuring better though not healed seems to be doing excellent at this point which is great news. Overall I am very pleased with how things appear currently. 10/27/2019 upon evaluation today patient appears to be doing quite well with regard to his wounds currently. He has been tolerating the dressing changes without complication. Fortunately there is no signs of active infection at this time. In fact he is almost completely healed on the left and has just a very small area open and on the right he is completely healed. 11/04/2019 upon evaluation today patient appears to be doing quite well with regard to his wounds. In fact he appears to be quite possibly completely healed on the left lower extremity now as well the right lower extremity is still doing well. With that being said unfortunately though this may be healed I think it is a very fragile area and I cannot even confirm there is not a very small opening still remaining. I really think he may benefit from 1 additional weeks wrapping before discontinuing. OHN, BOSTIC ([id number]) 11/10/2019 upon evaluation today patient appears to be doing well in regard to his original wounds in fact everything that we were treating last week is completely healed on the left. Unfortunately he has a new skin tear just above where the wrap slipped down due to a fall he sustained causing this injury. 11/17/2019 on evaluation today patient appears to be doing better with regard to his wound. He has been tolerating the dressing changes  without complication. Fortunately there is no signs of active infection at this time. No fevers, chills, nausea, vomiting, or diarrhea. 11/24/2019 upon evaluation today patient appears to be doing well with regard to his wound. This is making good progress there does not appear to be signs of active infection but overall I do feel like he is  headed in the correct direction. Overall he is tolerating the compression wrap as well. 12/02/2019 upon evaluation today patient appears to be doing well with regard to his leg ulcer. In fact this appears to be completely healed and this is the last of the wounds that he had but still the left anterior lower extremity. Fortunately there is no signs of active infection at this time. No fevers, chills, nausea, vomiting, or diarrhea. Readmission: 02/05/2020 on evaluation today patient appears to be doing well with regard to his wound all things considered. He actually tells me that a couple weeks ago he had trouble with his wraps and therefore took him off for a couple of days in order to give his legs a break. It was during this time that he actually developed increased swelling and edema of his legs and blisters on both legs. That is when he called to make the appointment. Nonetheless since that time he began wearing his compression wraps which are Velcro in the meantime and has done extremely well with this. In fact his leg appears to be under good control as far as edema is concerned today it is nothing like what I am seeing when he has come in for evaluations in the past. They have been using some of the silver alginate at home which has been beneficial for him. Fortunately there is no signs of active infection at this time. No fevers, chills, nausea, vomiting, or diarrhea. 02/19/2020 on evaluation today patient unfortunately does not appear to be doing quite as well as I would like to see. He has no signs of active infection at this time but unfortunately he is not  doing well in regard to the overall appearance of his left leg. He has a couple other areas that are weeping now and the leg is much more swollen. I think that he needs to actually have a compression wrap to try to manage this. 02/26/2020 on evaluation today patient appears to be doing well with regard to his left lower extremity ulcer. Fortunately the swelling is down I do believe the pressure wrap seems to be doing quite well. Fortunately there is no signs of active infection at this time. 03/05/2020 upon evaluation today patient appears to be doing excellent in regard to his legs at this point. Fortunately there is no signs of active infection which is great news. Overall he feels like he is completely healed which is great news. Readmission: 05/20/2020 upon evaluation today patient appears to be doing well with regard to his leg ulcer all things considered. He is actually coming back to see [place] after having had a reopening of the ulcers on his left leg in the past several weeks. He is out of dressing supplies and his wife is been try to take care of this as best she can. 06/10/2020 upon evaluation today patient unfortunately appears to be doing significantly worse today compared to when I last saw him. He and his wife both tell me that "there has been a lot going on". I did not actually go into detail about everything that has been happening but nonetheless he has not obviously been wearing his compression. He brings them with him today but they were not on. I am not even certain to be honest that he could get those on at this point. Nonetheless I do believe that he is going to require compression wrapping at some point shortly but right now we need to try to get what appears to be  infection under control at this time. 06/28/2020 upon evaluation today patient appears to be doing well with regard to 06/28/2020 upon evaluation today patient appears to be doing well pretty well in regard to his left leg  which is much better than previous. With that being said in regard to his right leg he does seem to be having some issues with a new wound after he sustained a fall. This fortunately is not too deep but does appear to be something we need to address. Neither deep which is good. 07/05/2020 on evaluation today patient appears to be doing well with regard to his wounds. In fact everything on the left leg appears to be pretty much healed on the right leg this is very close though not completely closed yet. Fortunately there is no evidence of active infection at this time. No fevers, chills, nausea, vomiting, or diarrhea. 07/13/2019 upon evaluation today patient appears to be making good progress which is great news. Overall I am extremely pleased with where things stand today. There does not appear to be any signs of active infection which is great news and overall his left leg appears healed still the right leg though not healed does seem to be making good progress which is excellent news. He is having no pain. 07/19/2020 on evaluation today patient appears to be doing well with regard to his wound. He has been tolerating the dressing changes without complication. Fortunately there does not appear to be any signs of active infection at this time. No fevers, chills, nausea, vomiting, or diarrhea. Readmission: 08/20/2020 on evaluation today patient presents for reevaluation here in the clinic concerning issues that he has been having with his bilateral lower extremities. Unfortunately this is an ongoing reoccurring issue. He tells me that he is really not certain exactly what caused the issue although as I discussed this further with him it appears that he has been sitting for the most part and sleeping in his chair with his feet on the ground he is not really elevating at all in fact the chair does not even have a reclining function therefore it is basically just him sitting with his feet on the ground. I  think this alone has probably led to his increased swelling he is also having more difficulty walking which means that he is pumping less blood flow through his legs anyway as far as the venous stasis is concerned. All in all I think this is leading to a downward spiral of him not being able to walk very well as his legs are so heavy, they begin to weep, and overall he just does not seem to be doing as well as it was when I last saw him. 08/27/2020 upon evaluation today patient's legs actually are showing signs of improvement which is great news. There does not appear to be any evidence of infection which is also excellent news. I am extremely pleased with where things stand today. No fevers, chills, nausea, vomiting, or diarrhea. KOBYN, KRAY ([id number]) Objective Constitutional Well-nourished and well-hydrated in no acute distress. Vitals Time Taken: 10:26 AM, Temperature: 98.2 F, Pulse: 70 bpm, Respiratory Rate: 18 breaths/min, Blood Pressure: 163/80 mmHg. Respiratory normal breathing without difficulty. Psychiatric this patient is able to make decisions and demonstrates good insight into disease process. Alert and Oriented x 3. pleasant and cooperative. General Notes: Patient's wound bed showed signs of good granulation and epithelization at this point. There does not appear to be any evidence of infection which is great  news and overall I am very pleased with where things stand. No fevers, chills, nausea, vomiting, or diarrhea. Other Condition(s) Patient presents with Lymphedema located on the Right Leg. Patient presents with Lymphedema located on the Left Leg. Assessment Active Problems ICD-10 Lymphedema, not elsewhere classified Venous insufficiency (chronic) (peripheral) Procedures There was a Four Layer Compression Therapy Procedure by Carlene Coria, RN. Post procedure Diagnosis Wound #: Same as Pre-Procedure There was a Four Layer Compression Therapy Procedure by Carlene Coria, RN. Post procedure Diagnosis Wound #: Same as Pre-Procedure Plan Follow-up Appointments: Return Appointment in 1 week. Bathing/ Shower/ Hygiene: May shower with wound dressing protected with water repellent cover or cast protector. Edema Control - Lymphedema / Segmental Compressive Device / Other: 4 Layer Compression System (Left, Right, Bilateral) Lymphedema. - apply xsorb, bi lat lower extrimities Elevate, Exercise Daily and Avoid Standing for Long Periods of Time. Elevate legs to the level of the heart and pump ankles as often as possible Elevate leg(s) parallel to the floor when sitting. CHELSEA, NUSZ. ([id number]) 1. Would recommend currently that we have the patient go ahead and continue the 4-layer compression wrap I think this is doing a good job. 2. Muscle can recommend that we continue with XtraSorb bilaterally which I think is doing a good job as well. 3. I would also recommend he continue to elevate his legs and use his compression pumps. We will see patient back for reevaluation in 1 week here in the clinic. If anything worsens or changes patient will contact our office for additional recommendations. Electronic Signature(s) Signed: 08/27/2020 12:40:08 PM By: Worthy Keeler PA-C Entered By: Worthy Keeler on 08/27/2020 12:40:07 Adam Merritt ([id number]) -------------------------------------------------------------------------------- SuperBill Details Patient Name: Adam Merritt Date of Service: 08/27/2020 Medical Record Number: [id number] Patient Account Number: [id number] Date of Birth/Sex: 1945/08/16 (75 y.o. M) Treating RN: Carlene Coria Primary Care Provider: [name] Other Clinician: Jeanine Luz Referring Provider: [name] Treating Provider/Extender: Jeri Cos [name]: 1 Diagnosis Coding ICD-10 Codes Code Description I89.0 Lymphedema, not elsewhere classified I87.2 Venous insufficiency (chronic) (peripheral) Facility  Procedures CPT4: Description Modifier Quantity Code [id number] BILATERAL: Application of multi-layer venous compression system; leg (below knee), including 1 ankle and foot. Physician Procedures CPT4 Code: 0277412 Description: 87867 - WC PHYS LEVEL 3 - EST PT Modifier: Quantity: 1 CPT4 Code: Description: ICD-10 Diagnosis Description I89.0 Lymphedema, not elsewhere classified I87.2 Venous insufficiency (chronic) (peripheral) Modifier: Quantity: Electronic Signature(s) Signed: 08/27/2020 12:40:17 PM By: Worthy Keeler PA-C Entered By: Worthy Keeler on 08/27/2020 12:40:17

## 2020-08-30 NOTE — Progress Notes (Signed)
TOMER, CHALMERS ([id number]) Visit Report for 08/27/2020 Arrival Information Details Patient Name: CURT, OATIS Date of Service: 08/27/2020 10:15 AM Medical Record Number: [id number] Patient Account Number: [id number] Date of Birth/Sex: [birth year] (75 y.o. M) Treating RN: Carlene Coria Primary Care Seara Hinesley: [name] Other Clinician: Jeanine Luz Referring Rupinder Livingston: [name] Treating Kannon Granderson/Extender: Skipper Cliche in Treatment: 1 Visit Information History Since Last Visit Added or deleted any medications: No Patient Arrived: Wheel Chair Had a fall or experienced change in Yes Arrival Time: 10:20 activities of daily living that may affect Accompanied By: wife risk of falls: Transfer Assistance: EasyPivot Patient Lift Hospitalized since last visit: No Patient Identification Verified: Yes Pain Present Now: No Secondary Verification Process Completed: Yes Electronic Signature(s) Signed: 08/27/2020 2:45:39 PM By: Jeanine Luz Entered By: Jeanine Luz on 08/27/2020 10:26:17 [name] ([id number]) -------------------------------------------------------------------------------- Clinic Level of Care Assessment Details Patient Name: [name] Date of Service: 08/27/2020 10:15 AM Medical Record Number: [id number] Patient Account Number: [id number] Date of Birth/Sex: December 12, 1945 (75 y.o. M) Treating RN: Carlene Coria Primary Care Johnesha Acheampong: [name] Other Clinician: Jeanine Luz Referring Hillarie Harrigan: [name] Treating Larcenia Holaday/Extender: Skipper Cliche in Treatment: 1 Clinic Level of Care Assessment Items TOOL 1 Quantity Score []  - Use when EandM and Procedure is performed on INITIAL visit 0 ASSESSMENTS - Nursing Assessment / Reassessment []  - General Physical Exam (combine w/ comprehensive assessment (listed just below) when performed on new 0 pt. evals) []  - 0 Comprehensive Assessment (HX, ROS, Risk Assessments, Wounds Hx, etc.) ASSESSMENTS -  Wound and Skin Assessment / Reassessment []  - Dermatologic / Skin Assessment (not related to wound area) 0 ASSESSMENTS - Ostomy and/or Continence Assessment and Care []  - Incontinence Assessment and Management 0 []  - 0 Ostomy Care Assessment and Management (repouching, etc.) PROCESS - Coordination of Care []  - Simple Patient / Family Education for ongoing care 0 []  - 0 Complex (extensive) Patient / Family Education for ongoing care []  - 0 Staff obtains Programmer, systems, Records, Test Results / Process Orders []  - 0 Staff telephones HHA, Nursing Homes / Clarify orders / etc []  - 0 Routine Transfer to another Facility (non-emergent condition) []  - 0 Routine Hospital Admission (non-emergent condition) []  - 0 New Admissions / Biomedical engineer / Ordering NPWT, Apligraf, etc. []  - 0 Emergency Hospital Admission (emergent condition) PROCESS - Special Needs []  - Pediatric / Minor Patient Management 0 []  - 0 Isolation Patient Management []  - 0 Hearing / Language / Visual special needs []  - 0 Assessment of Community assistance (transportation, D/C planning, etc.) []  - 0 Additional assistance / Altered mentation []  - 0 Support Surface(s) Assessment (bed, cushion, seat, etc.) INTERVENTIONS - Miscellaneous []  - External ear exam 0 []  - 0 Patient Transfer (multiple staff / Civil Service fast streamer / Similar devices) []  - 0 Simple Staple / Suture removal (25 or less) []  - 0 Complex Staple / Suture removal (26 or more) []  - 0 Hypo/Hyperglycemic Management (do not check if billed separately) []  - 0 Ankle / Brachial Index (ABI) - do not check if billed separately Has the patient been seen at the hospital within the last three years: Yes Total Score: 0 Level Of Care: ____ [name] ([id number]) Electronic Signature(s) Signed: 08/30/2020 5:08:01 PM By: Carlene Coria RN Entered By: Carlene Coria on 08/27/2020 11:02:46 [name]  ([id number]) -------------------------------------------------------------------------------- Compression Therapy Details Patient Name: [name] Date of Service: 08/27/2020 10:15 AM Medical Record Number: [id number] Patient Account Number: [id number] Date of  Birth/Sex: 01/15/1946 (75 y.o. M) Treating RN: Carlene Coria Primary Care Elektra Wartman: [name] Other Clinician: Jeanine Luz Referring Mame Twombly: [name] Treating Layia Walla/Extender: Jeri Cos [name]: 1 Compression Therapy Performed for Wound Assessment: NonWound Condition Lymphedema - Right Leg Performed By: Clinician Carlene Coria, RN Compression Type: Four Layer Post Procedure Diagnosis Same as Pre-procedure Electronic Signature(s) Signed: 08/30/2020 5:08:01 PM By: Carlene Coria RN Entered By: Carlene Coria on 08/27/2020 11:00:13 [name] ([id number]) -------------------------------------------------------------------------------- Compression Therapy Details Patient Name: [name] Date of Service: 08/27/2020 10:15 AM Medical Record Number: [id number] Patient Account Number: [id number] Date of Birth/Sex: 01-21-1946 (75 y.o. M) Treating RN: Carlene Coria Primary Care Takeesha Isley: [name] Other Clinician: Jeanine Luz Referring Williard Keller: [name] Treating Dawnetta Copenhaver/Extender: Jeri Cos [name]: 1 Compression Therapy Performed for Wound Assessment: NonWound Condition Lymphedema - Left Leg Performed By: Clinician Carlene Coria, RN Compression Type: Four Layer Post Procedure Diagnosis Same as Pre-procedure Electronic Signature(s) Signed: 08/30/2020 5:08:01 PM By: Carlene Coria RN Entered By: Carlene Coria on 08/27/2020 11:00:40 [name] ([id number]) -------------------------------------------------------------------------------- Lower Extremity Assessment Details Patient Name: [name] Date of Service: 08/27/2020 10:15 AM Medical Record Number: [id number] Patient  Account Number: [id number] Date of Birth/Sex: Jul 23, 1945 (75 y.o. M) Treating RN: Carlene Coria Primary Care Juancarlos Crescenzo: [name] Other Clinician: Jeanine Luz Referring Liala Codispoti: [name] Treating Charlsie Fleeger/Extender: Jeri Cos [name]: 1 Edema Assessment Assessed: [Left: Yes] [Right: Yes] Edema: [Left: Yes] [Right: Yes] Calf Left: Right: Point of Measurement: 31 cm From Medial Instep 54.5 cm 49 cm Ankle Left: Right: Point of Measurement: 12 cm From Medial Instep 39.5 cm 30 cm Vascular Assessment Pulses: Dorsalis Pedis Palpable: [Left:No] [Right:No] Electronic Signature(s) Signed: 08/27/2020 2:45:39 PM By: Jeanine Luz Signed: 08/30/2020 5:08:01 PM By: Carlene Coria RN Entered By: Jeanine Luz on 08/27/2020 10:43:52 [name] ([id number]) -------------------------------------------------------------------------------- Multi Wound Chart Details Patient Name: [name] Date of Service: 08/27/2020 10:15 AM Medical Record Number: [id number] Patient Account Number: [id number] Date of Birth/Sex: 1945/12/26 (75 y.o. M) Treating RN: Carlene Coria Primary Care Dajuana Palen: [name] Other Clinician: Jeanine Luz Referring Dalinda Heidt: [name] Treating Montae Stager/Extender: Jeri Cos [name]: 1 Vital Signs Height(in): Pulse(bpm): 70 Weight(lbs): Blood Pressure(mmHg): 163/80 Body Mass Index(BMI): Temperature(F): 98.2 Respiratory Rate(breaths/min): 18 Wound Assessments Treatment Notes Electronic Signature(s) Signed: 08/30/2020 5:08:01 PM By: Carlene Coria RN Entered By: Carlene Coria on 08/27/2020 10:59:13 [name] ([id number]) -------------------------------------------------------------------------------- [place] Details Patient Name: [name] Date of Service: 08/27/2020 10:15 AM Medical Record Number: [id number] Patient Account Number: [id number] Date of Birth/Sex: May 01, 1946 (75 y.o. M) Treating RN:  Carlene Coria Primary Care Roshawn Lacina: [name] Other Clinician: Jeanine Luz Referring Janard Culp: [name] Treating Desera Graffeo/Extender: Skipper Cliche in Treatment: 1 Active Inactive Wound/Skin Impairment Nursing Diagnoses: Knowledge deficit related to ulceration/compromised skin integrity Goals: Patient/caregiver will verbalize understanding of skin care regimen Date Initiated: 08/20/2020 Target Resolution Date: 09/20/2020 Goal Status: Active Interventions: Assess patient/caregiver ability to obtain necessary supplies Assess patient/caregiver ability to perform ulcer/skin care regimen upon admission and as needed Assess ulceration(s) every visit Notes: Electronic Signature(s) Signed: 08/30/2020 5:08:01 PM By: Carlene Coria RN Entered By: Carlene Coria on 08/27/2020 10:59:03 [name] ([id number]) -------------------------------------------------------------------------------- Non-Wound Condition Assessment Details Patient Name: [name] Date of Service: 08/27/2020 10:15 AM Medical Record Number: [id number] Patient Account Number: [id number] Date of Birth/Gender: [birth year] (75 y.o. M) Treating RN: Carlene Coria Primary Care Physician: [name] Other Clinician: Jeanine Luz Referring Physician: [name]  Treating Physician/Extender: Jeri Cos [name]: 1 Non-Wound Condition: Condition: Lymphedema Location: Leg Side: Right Photos Electronic Signature(s) Signed: 08/27/2020 2:45:39 PM By: Jeanine Luz Signed: 08/30/2020 5:08:01 PM By: Carlene Coria RN Entered By: Jeanine Luz on 08/27/2020 10:45:09 [name] ([id number]) -------------------------------------------------------------------------------- Non-Wound Condition Assessment Details Patient Name: [name] Date of Service: 08/27/2020 10:15 AM Medical Record Number: [id number] Patient Account Number: [id number] Date of Birth/Gender: 09-28-1945 (75 y.o. M) Treating RN:  Carlene Coria Primary Care Physician: [name] Other Clinician: Jeanine Luz Referring Physician: [name] Treating Physician/Extender: Jeri Cos [name]: 1 Non-Wound Condition: Condition: Lymphedema Location: Leg Side: Left Photos Electronic Signature(s) Signed: 08/27/2020 2:45:39 PM By: Jeanine Luz Signed: 08/30/2020 5:08:01 PM By: Carlene Coria RN Entered By: Jeanine Luz on 08/27/2020 10:45:09 [name] ([id number]) -------------------------------------------------------------------------------- Pain Assessment Details Patient Name: [name] Date of Service: 08/27/2020 10:15 AM Medical Record Number: [id number] Patient Account Number: [id number] Date of Birth/Sex: July 09, 1945 (75 y.o. M) Treating RN: Carlene Coria Primary Care Aliyanna Wassmer: [name] Other Clinician: Jeanine Luz Referring Yohannes Waibel: [name] Treating Ellsworth Waldschmidt/Extender: Skipper Cliche in Treatment: 1 Active Problems Location of Pain Severity and Description of Pain Patient Has Paino Yes Site Locations Rate the pain. Current Pain Level: 2 Pain Management and Medication Current Pain Management: Electronic Signature(s) Signed: 08/27/2020 2:45:39 PM By: Jeanine Luz Signed: 08/30/2020 5:08:01 PM By: Carlene Coria RN Entered By: Jeanine Luz on 08/27/2020 10:28:54 [name] ([id number]) -------------------------------------------------------------------------------- Patient/Caregiver Education Details Patient Name: [name] Date of Service: 08/27/2020 10:15 AM Medical Record Number: [id number] Patient Account Number: [id number] Date of Birth/Gender: 11/23/1945 (75 y.o. M) Treating RN: Carlene Coria Primary Care Physician: [name] Other Clinician: Jeanine Luz Referring Physician: [name] Treating Physician/Extender: Skipper Cliche in Treatment: 1 Education Assessment Education Provided To: Patient Education Topics  Provided Wound/Skin Impairment: Methods: Explain/Verbal Responses: State content correctly Electronic Signature(s) Signed: 08/30/2020 5:08:01 PM By: Carlene Coria RN Entered By: Carlene Coria on 08/27/2020 11:03:08 [name] ([id number]) -------------------------------------------------------------------------------- [place] Details Patient Name: [name] Date of Service: 08/27/2020 10:15 AM Medical Record Number: [id number] Patient Account Number: [id number] Date of Birth/Sex: 1945-11-03 (75 y.o. M) Treating RN: Carlene Coria Primary Care Ossie Beltran: [name] Other Clinician: Jeanine Luz Referring Elynor Kallenberger: [name] Treating Rodgerick Gilliand/Extender: Jeri Cos [name]: 1 Vital Signs Time Taken: 10:26 Temperature (F): 98.2 Pulse (bpm): 70 Respiratory Rate (breaths/min): 18 Blood Pressure (mmHg): 163/80 Reference Range: 80 - 120 mg / dl Electronic Signature(s) Signed: 08/27/2020 2:45:39 PM By: Jeanine Luz Entered By: Jeanine Luz on 08/27/2020 10:28:34

## 2020-09-03 ENCOUNTER — Encounter: Payer: Medicare PPO | Attending: Physician Assistant | Admitting: Physician Assistant

## 2020-09-03 ENCOUNTER — Other Ambulatory Visit: Payer: Self-pay

## 2020-09-03 DIAGNOSIS — X58XXXA Exposure to other specified factors, initial encounter: Secondary | ICD-10-CM | POA: Insufficient documentation

## 2020-09-03 DIAGNOSIS — J449 Chronic obstructive pulmonary disease, unspecified: Secondary | ICD-10-CM | POA: Insufficient documentation

## 2020-09-03 DIAGNOSIS — I509 Heart failure, unspecified: Secondary | ICD-10-CM | POA: Diagnosis not present

## 2020-09-03 DIAGNOSIS — L97819 Non-pressure chronic ulcer of other part of right lower leg with unspecified severity: Secondary | ICD-10-CM | POA: Diagnosis not present

## 2020-09-03 DIAGNOSIS — E11621 Type 2 diabetes mellitus with foot ulcer: Secondary | ICD-10-CM | POA: Diagnosis not present

## 2020-09-03 DIAGNOSIS — I872 Venous insufficiency (chronic) (peripheral): Secondary | ICD-10-CM | POA: Insufficient documentation

## 2020-09-03 DIAGNOSIS — I890 Lymphedema, not elsewhere classified: Secondary | ICD-10-CM | POA: Diagnosis not present

## 2020-09-03 DIAGNOSIS — L97829 Non-pressure chronic ulcer of other part of left lower leg with unspecified severity: Secondary | ICD-10-CM | POA: Diagnosis not present

## 2020-09-03 DIAGNOSIS — I110 Hypertensive heart disease with heart failure: Secondary | ICD-10-CM | POA: Diagnosis not present

## 2020-09-03 DIAGNOSIS — S99921A Unspecified injury of right foot, initial encounter: Secondary | ICD-10-CM | POA: Diagnosis not present

## 2020-09-03 NOTE — Progress Notes (Addendum)
STALIN, GRUENBERG ([id number]) Visit Report for 09/03/2020 Chief Complaint Document Details Patient Name: Adam Merritt, JOHNDROW Date of Service: 09/03/2020 12:45 PM Medical Record Number: [id number] Patient Account Number: [id number] Date of Birth/Sex: 1946-02-13 (75 y.o. M) Treating RN: Carlene Coria Primary Care Provider: [name] Other Clinician: Jeanine Luz Referring Provider: [name] Treating Provider/Extender: Skipper Cliche in Treatment: 2 Information Obtained from: Patient Chief Complaint Bilateral LE Ulcers Electronic Signature(s) Signed: 09/03/2020 1:19:00 PM By: Worthy Keeler PA-C Entered By: Worthy Keeler on 09/03/2020 13:19:00 Adam Merritt ([id number]) -------------------------------------------------------------------------------- HPI Details Patient Name: Adam Merritt Date of Service: 09/03/2020 12:45 PM Medical Record Number: [id number] Patient Account Number: [id number] Date of Birth/Sex: 1945/11/30 (75 y.o. M) Treating RN: Carlene Coria Primary Care Provider: [name] Other Clinician: Jeanine Luz Referring Provider: [name] Treating Provider/Extender: Skipper Cliche in Treatment: 2 History of Present Illness HPI Description: 10/18/17-He is here for initial evaluation of bilateral lower extremity ulcerations in the presence of venous insufficiency and lymphedema. He has been seen by vascular medicine in the past, Dr. Lucky Cowboy, last seen in 2016. He does have a history of abnormal ABIs, which is to be expected given his lymphedema and venous insufficiency. According to Epic, it appears that all attempts for arterial evaluation and/or angiography were not follow through with by patient. He does have a history of being seen in lymphedema clinic in 2018, stopped going approximately 6 months ago stating "it didn't do any good". He does not have lymphedema pumps, he does not have custom fit compression wrap/stockings. He is diabetic and his recent A1c last  month was 7.6. He admits to chronic bilateral lower extremity pain, no change in pain since blister and ulceration development. He is currently being treated with Levaquin for bronchitis. He has home health and we will continue. 10/25/17-He is here in follow-up evaluation for bilateral lower extremity ulcerationssubtle he remains on Levaquin for bronchitis. Right lower extremity with no evidence of drainage or ulceration, persistent left lower extremity ulceration. He states that home health has not been out since his appointment. He went to [facility] on Tuesday, studies revealed: RIGHT ABI 0.9, TBI 0.6 LEFT ABI 1.1, TBI 0.6 with triphasic flow bilaterally. We will continue his same treatment plan. He has been educated on compression therapy and need for elevation. He will benefit from lymphedema pumps 11/01/17-He is here in follow-up evaluation for left lower extremity ulcer. The right lower extremity remains healed. He has home health services, but they have not been out to see the patient for 2-3 weeks. He states it home health physical therapy changed his dressing yesterday after therapy; he placed Ace wrap compression. We are still waiting for lymphedema pumps, reordered d/t need for company change. 11/08/17-He is here in follow-up evaluation for left lower extremity ulcer. It is improved. Edema is significantly improved with compression therapy. We will continue with same treatment plan and he will follow-up next week. No word regarding lymphedema pumps 11/15/17-He is here in follow-up evaluation for left lower extremity ulcer. He is healed and will be discharged from wound care services. I have reached out to [facility] regarding his lymphedema pumps. They have been unable to reach the patient; the contact number they had with the patient's wife's cell phone and she has not answered any unrecognized calls. Contact should be made today, trial planned for next week; [facility] will continue to follow 11/27/17 on evaluation today patient has multiple blistered areas over the  right lower extremity his left lower extremity appears to be doing okay. These blistered areas show signs of no infection which is great news. With that being said he did have some necrotic skin overlying which was mechanically debrided away with saline and gauze today without complication. Overall post debridement the wounds appear to be doing better but in general his swelling seems to be increased. This is obviously not good news. I think this is what has given rise to the blisters. 12/04/17 on evaluation today patient presents for follow-up concerning his bilateral lower extremity edema in the right lower extremity ulcers. He has been tolerating the dressing changes without complication. With that being said he has had no real issues with the wraps which is also good news. Overall I'm pleased with the progress he's been making. 12/11/17 on evaluation today patient appears to be doing rather well in regard to his right lateral lower extremity ulcer. He's been tolerating the dressing changes without complication. Fortunately there does not appear to be any evidence of infection at this time. Overall I'm pleased with the progress that is being made. Unfortunately he has been in the hospital due to having what sounds to be a stomach virus/flu fortunately that is starting to get better. 12/18/17 on evaluation today patient actually appears to be doing very well in regard to his bilateral lower extremities the swelling is under fairly good control his lymphedema pumps are still not up and running quite yet. With that being said he does have several areas of opening noted as far as wounds are concerned mainly over the left lower extremity. With that being said I do believe once he gets lymphedema pumps this would least help you mention some the fluid and preventing this from occurring. Hopefully that  will be set up soon sleeves are Artie in place at his home he just waiting for the machine. 12/25/17 on evaluation today patient actually appears to be doing excellent in fact all of his ulcers appear to have resolved his legs appear very well. I do think he needs compression stockings we have discussed this and they are actually going to go to [place] today to elastic therapy to get this fitted for him. I think that is definitely a good thing to do. Readmission: 04/09/18 upon evaluation today this patient is seen for readmission due to bilateral lower extremity lymphedema. He has significant swelling of his extremities especially on the left although the right is also swollen he has weeping from both sides. There are no obvious open wounds at this point. Fortunately he has been doing fairly well for quite a bit of time since I last saw him. Nonetheless unfortunately this seems to have reopened and is giving quite a bit of trouble. He states this began about a week ago when he first called [place] to get in to be seen. No fevers, chills, nausea, or vomiting noted at this time. He has not been using his lymphedema pumps due to the fact that they won't fit on his leg at this point likewise is also not been using his compression for essentially the same reason. 04/16/18 upon evaluation today patient actually appears to be doing a little better in regard to the fluid in his bilateral lower extremities. With that being said he's had three falls since I saw him last week. He also states that he's been feeling very poorly. I was concerned last week and feel like that the concern is still there as far as the  congestion in his chest is concerned he seems to be breathing about the same as last week but again he states he's very weak he's not even able to walk further than from the chair to the door. His wife had to buy a wheelchair just to be able to get them out of the house to get to the appointment today. This  has me very concerned. 04/23/18 on evaluation today patient actually appears to be doing much better than last week's evaluation. At that time actually had to transport him to the ER via EMS and he subsequently was admitted for acute pulmonary edema, acute renal failure, and acute congestive heart failure. Fortunately he is doing much better. Apparently they did dialyze him and were able to take off roughly 35 pounds of fluid. Nonetheless he is feeling much better both in regard to his breathing and he's able to get around much better at this time compared to previous. Overall I'm very IZZAK, FRIES ([id number]) happy with how things are at this time. There does not appear to be any evidence of infection currently. No fevers, chills, nausea, or vomiting noted at this time. 04/30/2018 patient seen today for follow-up and management of bilateral lower extremity lymphedema. He did express being more sad today than usual due to the recent loss of his dog. He states that he has been compliant with using the lymphedema pumps. However he does admit a minute over the last 2-3 days he has not been using the pumps due to the recent loss of his dog. At this time there is no drainage or open wounds to his lower extremities. The left leg edema is measuring smaller today. Still has a significant amount of edema on bilateral lower extremities With dry flaky skin. He will be referred to the lymphedema clinic for further management. Will continue 3 layer compression wraps and follow-up in 1-2 weeks.Denies any pain, fever, chairs recently. No recent falls or injuries reported during this visit. 05/07/18 on evaluation today patient actually appears to be doing very well in regard to his lower extremities in general all things considered. With that being said he is having some pain in the legs just due to the amount of swelling. He does have an area where he had a blister on the left lateral lower extremity this is open  at this point other than that there's nothing else weeping at this time. 05/14/18 on evaluation today patient actually appears to be doing excellent all things considered in regard to his lower extremities. He still has a couple areas of weeping on each leg which has continued to be the issue for him. He does have an appointment with the lymphedema clinic although this isn't until February 2020. That was the earliest they had. In the meantime he has continued to tolerate the compression wraps without complication. 05/28/18 on evaluation today patient actually appears to be doing more poorly in regard to his left lower extremity where he has a wound open at this point. He also had a fall where he subsequently injured his right great toe which has led to an open wound at the site unfortunately. He has been tolerating the dressing changes without complication in general as far as the wraps are concerned that he has not been putting any dressing on the left 1st toe ulcer site. 06/11/18 on evaluation today patient appears to be doing much worse in regard to his bilateral lower extremity ulcers. He has been tolerating the dressing changes without  complication although his legs have not been wrapped more recently. Overall I am not very pleased with the way his legs appear. I do believe he needs to be back in compression wraps he still has not received his compression wraps from the [facility] hospital as of yet. 06/18/18 on evaluation today patient actually appears to be doing significantly better than last time I saw him. He has been tolerating the compression wraps without complication in the circumferential ulcers especially appear to be doing much better. His toe ulcer on the right in regard to the great toe is better although not as good as the legs in my pinion. No fevers chills noted 07/02/18 on evaluation today patient appears to be doing much better in regard to his lower extremity ulcers. Unfortunately since I  last saw him he's had the distal portion of his right great toe if he dated it sounds as if this actually went downhill very quickly. I had only seen him a few days prior and the toe did not appear to be infected at that point subsequently became infected very rapidly and it was decided by the surgeon that the distal portion of the toe needed to be removed. The patient seems to be doing well in this regard he tells me. With that being said his lower extremities are doing better from the standpoint of the wounds although he is significantly swollen at this point. 07/09/18 on evaluation today patient appears to be doing better in regard to the wounds on his lower extremities. In fact everything is almost completely healed he is just a small area on the left posterior lower extremity that is open at this point. 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 With that being said he needs some kind of compression it does sound like he may not be wearing his compression, that is the wraps, during the entire time between when he's here visit to visit. Apparently his wife took the current one off because it began to "fall apart". 07/16/18 on evaluation today patient appears to be extremely swollen especially in regard to his left lower extremity unfortunately. He also has a new skin tear over the left lower extremity and there's a smaller area on the right lower extremity as well. Unfortunately this seems to be due in part to blistering and fluid buildup in his leg. He did get the reduction wraps that were ordered by the [facility] hospital for him to go to lymphedema clinic. With that being said his wounds on the legs have not healed to the point to where they would likely accept them as a patient lymphedema clinic currently. We need to try  to get this to heal. With that being said he's been taking his wraps off which is not doing him any favors at this point. In fact this is probably quite counterproductive compared to what needs to occur. We will likely need to increase to a four layer compression wrap and continue to also utilize elevation and he has to keep the wraps on not take them off as he's been doing currently hasn't had a wrap on since Saturday. 07/23/18 on evaluation today patient appears to be doing much better in regard to his bilateral lower extremities. In fact his left lower extremity which was the largest is actually 15 cm smaller today  compared to what it was last time he was here in our clinic. This is obviously good news after just one week. Nonetheless the differences he actually kept the wraps on during the entire week this time. That's not typical for him. I do believe he understands a little bit better now the severity of the situation and why it's important for him to keep these wraps on. 07/30/18 on evaluation today patient actually appears to be doing rather well in regard to his lower extremities. His legs are much smaller than they have been in the past and he actually has only one very small rudder superficial region remaining that is not closed on the left lateral/posterior lower extremity even this is almost completely close. I do believe likely next week he will be healed without any complications. I do think we need to continue the wraps however this seems to be beneficial for him. I also think it may be a good time for [place] to go ahead and see about getting the appointment with the lymphedema clinic which is supposed to be made for him in order to keep this moving along and hopefully get them into compression wraps that will in the end help him to remain healed. 08/06/18 on evaluation today patient actually appears to be doing very well in regard as bilateral lower extremities. In fact his wounds appear to  be completely healed at this time. 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 He is in the process of getting appointment with the lymphedema clinic we have made this referral were just waiting to hear back on the schedule time. We need to follow up on that today as well. 08/13/18 on evaluation today patient actually appears to be doing very well in regard to his bilateral lower extremities there are no open wounds at this point. We are gonna go ahead and see about ordering the Velcro compression wraps for him had a discussion with them about [place] doing it versus the [place] they feel like they can definitely afford going ahead and get the wraps themselves and they would prefer to try to avoid having to go to the lymphedema clinic if it all possible which I completely understand. As long as he has good compression I'm okay either way. 3/17//20 on evaluation today patient actually appears to be doing well in regard to his bilateral lower extremity ulcers. He has been tolerating the dressing changes without complication specifically the compression wraps. Overall is had no issues my fingers finding that I see at this point is that he is having trouble with constipation. He tells me he has not been able to go to the bathroom for about six days. He's taken to over-the- counter oral laxatives unfortunately this is not helping. He has not contacted his doctor. KATIE, MOCH ([id number]) 08/27/18 on evaluation today patient appears to be doing fairly well in regard to his lower extremities at this point. There does not appear to be any new altars is swelling is very well controlled. We are still waiting for his Velcro compression wraps to arrive that should be sometime in the next week is Artie sent the check for these. 09/03/18 on evaluation today patient appears to be doing excellent in regard to his bilateral lower extremities which he shows no signs of  wound openings in regard to this point. He does have his Velcro compression wraps which did arrive in the mail since I saw him last  week. Overall he is doing excellent in my pinion. 09/13/18 on evaluation today patient appears to be doing very well currently in regard to the overall appearance of his bilateral lower extremities although he's a little bit more swollen than last time we saw him. At that point he been discharged without any open wounds. Nonetheless he has a small open wound on the posterior left lower extremity with some evidence of cellulitis noted as well. Fortunately I feel like he has made good progress overall with regard to his lower extremities from were things used to be. 09/20/18 on evaluation today patient actually appears to be doing much better. The erythematous lower extremity is improving wound itself which is still open appears to be doing much better as far as but appearance as well as pain is concerned overall very pleased in this regard. There's no signs of active infection at this time. 09/27/18 on evaluation today patient's wounds on the lower extremity actually appear to be doing fairly well at this time which is good news. There is no evidence of active infection currently and again is just as left lower extremity were there any wounds at this point anyway. I believe they may be completely healed but again I'm not 100% sure based on evaluation today. I think one more week of observation would likely be a good idea. 10/04/18 on evaluation today patient actually appears to be doing excellent in regard to the left lower extremity which actually appears to be completely healed as of today. Unfortunately he's been having issues with his right lower extremity have a new wound that has opened. Fortunately there's no evidence of active infection at this time which is good news. 10/10/18 upon evaluation today patient actually appears to be doing a little bit worse with the new  open area on his right posterior lower extremity. He's been tolerating the dressing changes without complication. Right now we been using his compression wraps although I think we may need to switch back to actually performing bilateral compression wraps are in the clinic. No fevers, chills, nausea, or vomiting noted at this time. 10/17/18 on evaluation today patient actually appears to be doing quite well in regard to his bilateral lower extremity ulcers. He has been tolerating the reps without complication although he would prefer not to rewrap his legs as of today. Fortunately there's no signs of active infection which is good news. No fevers, chills, nausea, or vomiting noted at this time. 10/24/18 on evaluation today patient appears to be doing very well in regard to his lower extremities. His right lower extremity is shown signs of healing and his left lower Trinity though not healed appears to be improving which is excellent news. Overall very pleased with how things seem to be progressing at this time. The patient is likewise happy to hear this. His Velcro compression wraps however have not been put on properly will gonna show his wife how to do that properly today. 10/31/18 on evaluation today patient appears to be doing more poorly in regard to his left lower extremity in particular although both lower extremities actually are showing some signs of being worse than my previous evaluation. Unfortunately I'm just not sure that his compression stockings even with the use of the compression/lymphedema pumps seem to be controlling this well. Upon further questioning he tells me that he also is not able to lie flat in the bed due to his congestive heart failure and difficulty breathing. For that reason he sleeps in  his recliner. To make matters worse his recliner also cannot even hold his legs up so instead of even being somewhat elevated they pretty much hang down to the floor. This is the way he  sleeps each night which is definitely counterproductive to everything else that were attempting to do from the standpoint of controlling his fluid. Nonetheless I think that he potentially could benefit from a hospital bed although this would be something that his primary care provider would likely have to order since anything that is order on our side has to be directly related to wound care and again the hospital bed is not necessarily a direct relation to although I think it does contribute to his overall wound status on his lower extremities. 11/07/18 on evaluation today patient appears to be doing better in regard to his bilateral lower extremity. He's been tolerating the dressing changes without complication. We did in the interim since I last saw him switch to just using extras orbiting new alginate and that seems to have done much better for him. I'm very pleased with the overall progress that is made. 11/14/18 on evaluation today patient appears to be redoing rather well in regard to his left lower extremity ulcers which are the only ones remaining at this point. Fortunately there's no signs of active infection at this time which is good news. Overall been very pleased with how things seem to be progressing currently. No fevers, chills, nausea, or vomiting noted at this time. 11/20/17 on evaluation today patient actually appears to be doing quite well in regard to his lower extremities on the left on the right he has several blisters that showed up although there's some question about whether or not he has had his broker compression wraps on like he was supposed to or not. Fortunately there's no signs of active infection at this time which is good news. Unfortunately though he is doing well from the standpoint of the left leg the right leg is not doing as well again this is when we did not wrap last week. 11/28/18 on evaluation today patient appears to be doing well in regard to his bilateral lower  extremities is left appears to be healed is right is not healed but is very close to being so. Overall very pleased with how things seem to be progressing. Patient is likewise happy that things are doing well. 12/09/18 on evaluation today patient actually appears to be completely healed which is excellent news. He actually seems to be doing well in regard to the swelling of the bilateral lower extremities which is also great news. Overall very pleased with how things seem to be progressing. Readmission: 03/18/2019 upon evaluation today patient presents for reevaluation concerning issues that he is having with his left lower extremity where he does have a wound noted upon inspection today. He also has a wound on the right second toe on the tip where it is apparently been rubbing in his shoe I did look at issues and you can actually see where this has been occurring as well. Fortunately there is no signs of infection with regard to the toe necessarily although it is very swollen compared to normal that does have me somewhat concerned about the possibility of further evaluation for infection/osteomyelitis. I would recommend an x-ray to start with. Otherwise he states that it is been 1-2 weeks that he has had the draining in regard to left lower extremity he does not know how this happened he has been wearing  his compression appropriately and I do see that as well today but nonetheless I do think that he needs to continue to be very cautious with regard to elevation as well. AVIAN, KONIGSBERG ([id number]) 03/25/2019 on evaluation today patient appears to be doing much better in regard to his lower extremity at this time. That is on the left. He did culture positive for Pseudomonas but the good news is he seems to be doing much better the gentamicin we applied topically seems to have done a great job for him. There is no signs of active infection at this time systemically and locally he is doing much better.  No fevers, chills, nausea, vomiting, or diarrhea. In regard to his right toe this seems to be doing much better there is very little pressure noted at this point I did clean up some of the callus today around the edges of the wound as well as the surface of the wound but to be honest he is progressing quite nicely. 10/30; the area on the left anterior lower tibia area is healed over. His edema control is adequate even though his use of the compression pumps seems very intermittent. He has a juxta lite stocking on the right leg and a think there is 1 for the left leg at home. His wife is putting these on. He has an area on the plantar right second toe which is a hammertoe they are trying to offload this 04/08/2019 on evaluation today patient appears to be doing well in regard to his lower extremities which is good news. We are seeing him today for his toe ulcer which is giving him a little bit more trouble but again still seems to be doing better at this point which is good news. He is going require some sharp debridement in regard to the toe today however. 04/15/2019 on evaluation today patient actually appears to be doing quite well with regard to his toe ulcer. I am very pleased with how things are doing in that regard. With regard to his lower extremity edema in general he tells me he is not been using the lymphedema pumps regularly although he does not use them. I think that he needs to use them more regularly he does have a small blister on the left lower extremity this is not open at this point but obviously it something that can get worse if he does not keep the compression going and the pumps going as well. 04/22/2019 on evaluation today patient appears to be doing well with regard to his toe ulcer. He has been tolerating the dressing changes without complication. This is measuring slightly smaller this week as compared to last week. Fortunately there is no signs of active infection at this  time. 05/06/2019 on evaluation today patient actually appears to be doing excellent. In fact his toe appears to be completely healed which is great news. There is no signs of active infection at this time. Readmission: Patient presents today for follow-up after having been discharged at the beginning of December 2020. He states that in recent weeks things have reopened and has been having more trouble at this point. With that being said fortunately there is no signs of active infection at this time. No fever chills noted. Nonetheless he also does have an issue with one of his toes as well that seems to be somewhat this is on the right foot second toe. 07/25/2019 upon evaluation today patient's lower extremities appear to be doing excellent bilaterally. I  really feel like he is showing signs of great improvement and in fact I think he is close to healing which is great news. There is no signs of active infection at this time. No fevers, chills, nausea, vomiting, or diarrhea. 07/31/2019 upon evaluation today patient appears to be doing excellent and in fact I think may be completely healed in regard to his right lower extremity that were still to monitor this 1 more week before closing it out. The left lower extremity is measuring smaller although not completely closed seems to be doing excellent. 08/07/2019 upon evaluation today patient appears to be doing excellent in regard to his wounds. In fact the right lower extremity is completely healed there is no issues here. The left lower extremity has 1 very small area still remaining fortunately there is no signs of infection and overall I think he is very close to closure of the here as well. 08/14/19 upon evaluation today patient appears to be doing excellent in regard to his lower extremities. In fact he appears to be completely healed as of today. Fortunately there's no signs of active infection at this time. No fevers, chills, nausea, or vomiting noted  at this time. READMISSION 09/26/2019 This is a 75 year old man who is well-known to this clinic having just been discharged on 08/14/2019. He has known lymphedema and chronic venous insufficiency. He has Farrow wrap stockings and also external compression pumps. He does not use the external compression pumps however. He does however apparently fairly faithfully use the stockings. They have not been lotion in his legs. He has developed new wounds on the right anterior as well as the left medial left lateral and left posterior calf. He returns for our review of this Past medical history includes coronary artery disease, hypertension, congestive heart failure, COPD, venous insufficiency with lymphedema, type 2 diabetes. He apparently has compression pumps, Farrow wraps and at 1 point a hospital bed although I believe he sleeps in a recliner 10/03/2019 upon evaluation today patient appears to be doing better with regard to his wounds in general. He has been tolerating the dressing changes without complication. Fortunately there is no signs of active infection. No fevers, chills, nausea, vomiting, or diarrhea. I do believe the compression wraps are helping as they always have in the past. 10/10/2019 upon evaluation today patient appears to be doing excellent at this point. His right lower extremity is measuring much better and in fact is completely healed. His left lower extremity is also measuring better though not healed seems to be doing excellent at this point which is great news. Overall I am very pleased with how things appear currently. 10/27/2019 upon evaluation today patient appears to be doing quite well with regard to his wounds currently. He has been tolerating the dressing changes without complication. Fortunately there is no signs of active infection at this time. In fact he is almost completely healed on the left and has just a very small area open and on the right he is completely  healed. 11/04/2019 upon evaluation today patient appears to be doing quite well with regard to his wounds. In fact he appears to be quite possibly completely healed on the left lower extremity now as well the right lower extremity is still doing well. With that being said unfortunately though this may be healed I think it is a very fragile area and I cannot even confirm there is not a very small opening still remaining. I really think he may benefit from 1  additional weeks wrapping before discontinuing. 11/10/2019 upon evaluation today patient appears to be doing well in regard to his original wounds in fact everything that we were treating last week is completely healed on the left. Unfortunately he has a new skin tear just above where the wrap slipped down due to a fall he sustained causing this injury. 11/17/2019 on evaluation today patient appears to be doing better with regard to his wound. He has been tolerating the dressing changes without BENECIO, KLUGER. ([id number]) complication. Fortunately there is no signs of active infection at this time. No fevers, chills, nausea, vomiting, or diarrhea. 11/24/2019 upon evaluation today patient appears to be doing well with regard to his wound. This is making good progress there does not appear to be signs of active infection but overall I do feel like he is headed in the correct direction. Overall he is tolerating the compression wrap as well. 12/02/2019 upon evaluation today patient appears to be doing well with regard to his leg ulcer. In fact this appears to be completely healed and this is the last of the wounds that he had but still the left anterior lower extremity. Fortunately there is no signs of active infection at this time. No fevers, chills, nausea, vomiting, or diarrhea. Readmission: 02/05/2020 on evaluation today patient appears to be doing well with regard to his wound all things considered. He actually tells me that a couple weeks ago he had  trouble with his wraps and therefore took him off for a couple of days in order to give his legs a break. It was during this time that he actually developed increased swelling and edema of his legs and blisters on both legs. That is when he called to make the appointment. Nonetheless since that time he began wearing his compression wraps which are Velcro in the meantime and has done extremely well with this. In fact his leg appears to be under good control as far as edema is concerned today it is nothing like what I am seeing when he has come in for evaluations in the past. They have been using some of the silver alginate at home which has been beneficial for him. Fortunately there is no signs of active infection at this time. No fevers, chills, nausea, vomiting, or diarrhea. 02/19/2020 on evaluation today patient unfortunately does not appear to be doing quite as well as I would like to see. He has no signs of active infection at this time but unfortunately he is not doing well in regard to the overall appearance of his left leg. He has a couple other areas that are weeping now and the leg is much more swollen. I think that he needs to actually have a compression wrap to try to manage this. 02/26/2020 on evaluation today patient appears to be doing well with regard to his left lower extremity ulcer. Fortunately the swelling is down I do believe the pressure wrap seems to be doing quite well. Fortunately there is no signs of active infection at this time. 03/05/2020 upon evaluation today patient appears to be doing excellent in regard to his legs at this point. Fortunately there is no signs of active infection which is great news. Overall he feels like he is completely healed which is great news. Readmission: 05/20/2020 upon evaluation today patient appears to be doing well with regard to his leg ulcer all things considered. He is actually coming back to see [place] after having had a reopening of the ulcers  on  his left leg in the past several weeks. He is out of dressing supplies and his wife is been try to take care of this as best she can. 06/10/2020 upon evaluation today patient unfortunately appears to be doing significantly worse today compared to when I last saw him. He and his wife both tell me that "there has been a lot going on". I did not actually go into detail about everything that has been happening but nonetheless he has not obviously been wearing his compression. He brings them with him today but they were not on. I am not even certain to be honest that he could get those on at this point. Nonetheless I do believe that he is going to require compression wrapping at some point shortly but right now we need to try to get what appears to be infection under control at this time. 06/28/2020 upon evaluation today patient appears to be doing well with regard to 06/28/2020 upon evaluation today patient appears to be doing well pretty well in regard to his left leg which is much better than previous. With that being said in regard to his right leg he does seem to be having some issues with a new wound after he sustained a fall. This fortunately is not too deep but does appear to be something we need to address. Neither deep which is good. 07/05/2020 on evaluation today patient appears to be doing well with regard to his wounds. In fact everything on the left leg appears to be pretty much healed on the right leg this is very close though not completely closed yet. Fortunately there is no evidence of active infection at this time. No fevers, chills, nausea, vomiting, or diarrhea. 07/13/2019 upon evaluation today patient appears to be making good progress which is great news. Overall I am extremely pleased with where things stand today. There does not appear to be any signs of active infection which is great news and overall his left leg appears healed still the right leg though not healed does seem to be  making good progress which is excellent news. He is having no pain. 07/19/2020 on evaluation today patient appears to be doing well with regard to his wound. He has been tolerating the dressing changes without complication. Fortunately there does not appear to be any signs of active infection at this time. No fevers, chills, nausea, vomiting, or diarrhea. Readmission: 08/20/2020 on evaluation today patient presents for reevaluation here in the clinic concerning issues that he has been having with his bilateral lower extremities. Unfortunately this is an ongoing reoccurring issue. He tells me that he is really not certain exactly what caused the issue although as I discussed this further with him it appears that he has been sitting for the most part and sleeping in his chair with his feet on the ground he is not really elevating at all in fact the chair does not even have a reclining function therefore it is basically just him sitting with his feet on the ground. I think this alone has probably led to his increased swelling he is also having more difficulty walking which means that he is pumping less blood flow through his legs anyway as far as the venous stasis is concerned. All in all I think this is leading to a downward spiral of him not being able to walk very well as his legs are so heavy, they begin to weep, and overall he just does not seem to be doing as well  as it was when I last saw him. 08/27/2020 upon evaluation today patient's legs actually are showing signs of improvement which is great news. There does not appear to be any evidence of infection which is also excellent news. I am extremely pleased with where things stand today. No fevers, chills, nausea, vomiting, or diarrhea. 09/03/20 upon evaluation today patient appears to be doing excellent in regard to his wounds currently. He has been tolerating the dressing changes without complication and overall I am extremely pleased with where  things stand today. No fevers, chills, nausea, vomiting, or diarrhea. Overall he is healed on the right leg although the left leg not being completely healed still is doing significantly better. Electronic Signature(s) Signed: 09/03/2020 1:52:22 PM By: Melynda Ripple ([id number]) Entered By: Worthy Keeler on 09/03/2020 13:52:22 ABDALRAHMAN, CLEMENTSON ([id number]) -------------------------------------------------------------------------------- Physical Exam Details Patient Name: Adam Merritt Date of Service: 09/03/2020 12:45 PM Medical Record Number: [id number] Patient Account Number: [id number] Date of Birth/Sex: 08-31-1945 (75 y.o. M) Treating RN: Carlene Coria Primary Care Provider: [name] Other Clinician: Jeanine Luz Referring Provider: [name] Treating Provider/Extender: Jeri Cos [name]: 2 Constitutional Obese and well-hydrated in no acute distress. Respiratory normal breathing without difficulty. Psychiatric this patient is able to make decisions and demonstrates good insight into disease process. Alert and Oriented x 3. pleasant and cooperative. Notes Upon inspection patient's wounds again showed signs of almost complete resolution he just has a couple leaking areas on the left leg his right leg is actually completely healed and seems to be doing excellent. There is no signs of active infection at this time. Electronic Signature(s) Signed: 09/03/2020 1:52:50 PM By: Worthy Keeler PA-C Entered By: Worthy Keeler on 09/03/2020 13:52:50 Adam Merritt ([id number]) -------------------------------------------------------------------------------- Physician Orders Details Patient Name: Adam Merritt Date of Service: 09/03/2020 12:45 PM Medical Record Number: [id number] Patient Account Number: [id number] Date of Birth/Sex: September 29, 1945 (75 y.o. M) Treating RN: Carlene Coria Primary Care Provider: [name] Other Clinician: Jeanine Luz Referring Provider: [name] Treating Provider/Extender: Skipper Cliche in Treatment: 2 Verbal / Phone Orders: No Diagnosis Coding ICD-10 Coding Code Description I89.0 Lymphedema, not elsewhere classified I87.2 Venous insufficiency (chronic) (peripheral) Follow-up Appointments o Return Appointment in 1 week. Bathing/ Shower/ Hygiene o May shower with wound dressing protected with water repellent cover or cast protector. Edema Control - Lymphedema / Segmental Compressive Device / Other o 4 Layer Compression System (Left, Right, Bilateral) Lymphedema. - abd pad , left lower leg o Patient to wear own Velcro compression garment. Remove compression stockings every night before going to bed and put on every morning when getting up. - right lower leg with tubi grip E o Elevate, Exercise Daily and Avoid Standing for Long Periods of Time. o Elevate legs to the level of the heart and pump ankles as often as possible o Elevate leg(s) parallel to the floor when sitting. Electronic Signature(s) Signed: 09/03/2020 4:34:06 PM By: Worthy Keeler PA-C Signed: 09/10/2020 8:11:40 AM By: Carlene Coria RN Entered By: Carlene Coria on 09/03/2020 13:29:59 Adam Merritt ([id number]) -------------------------------------------------------------------------------- Problem List Details Patient Name: Adam Merritt Date of Service: 09/03/2020 12:45 PM Medical Record Number: [id number] Patient Account Number: [id number] Date of Birth/Sex: 02-Jul-1945 (75 y.o. M) Treating RN: Carlene Coria Primary Care Provider: [name] Other Clinician: Jeanine Luz Referring Provider: [name] Treating Provider/Extender: Skipper Cliche in Treatment: 2 Active Problems ICD-10 Encounter Code Description Active Date MDM Diagnosis  I89.0 Lymphedema, not elsewhere classified 08/20/2020 No Yes I87.2 Venous insufficiency (chronic) (peripheral) 08/20/2020 No Yes Inactive Problems Resolved  Problems Electronic Signature(s) Signed: 09/03/2020 1:18:53 PM By: Worthy Keeler PA-C Entered By: Worthy Keeler on 09/03/2020 13:18:53 Adam Merritt ([id number]) -------------------------------------------------------------------------------- Progress Note Details Patient Name: Adam Merritt Date of Service: 09/03/2020 12:45 PM Medical Record Number: [id number] Patient Account Number: [id number] Date of Birth/Sex: [birth year] (75 y.o. M) Treating RN: Carlene Coria Primary Care Provider: [name] Other Clinician: Jeanine Luz Referring Provider: [name] Treating Provider/Extender: Skipper Cliche in Treatment: 2 Subjective Chief Complaint Information obtained from Patient Bilateral LE Ulcers History of Present Illness (HPI) 10/18/17-He is here for initial evaluation of bilateral lower extremity ulcerations in the presence of venous insufficiency and lymphedema. He has been seen by vascular medicine in the past, Dr. Lucky Cowboy, last seen in 2016. He does have a history of abnormal ABIs, which is to be expected given his lymphedema and venous insufficiency. According to Epic, it appears that all attempts for arterial evaluation and/or angiography were not follow through with by patient. He does have a history of being seen in lymphedema clinic in 2018, stopped going approximately 6 months ago stating "it didn't do any good". He does not have lymphedema pumps, he does not have custom fit compression wrap/stockings. He is diabetic and his recent A1c last month was 7.6. He admits to chronic bilateral lower extremity pain, no change in pain since blister and ulceration development. He is currently being treated with Levaquin for bronchitis. He has home health and we will continue. 10/25/17-He is here in follow-up evaluation for bilateral lower extremity ulcerationssubtle he remains on Levaquin for bronchitis. Right lower extremity with no evidence of drainage or ulceration, persistent left  lower extremity ulceration. He states that home health has not been out since his appointment. He went to [facility] on Tuesday, studies revealed: RIGHT ABI 0.9, TBI 0.6 LEFT ABI 1.1, TBI 0.6 with triphasic flow bilaterally. We will continue his same treatment plan. He has been educated on compression therapy and need for elevation. He will benefit from lymphedema pumps 11/01/17-He is here in follow-up evaluation for left lower extremity ulcer. The right lower extremity remains healed. He has home health services, but they have not been out to see the patient for 2-3 weeks. He states it home health physical therapy changed his dressing yesterday after therapy; he placed Ace wrap compression. We are still waiting for lymphedema pumps, reordered d/t need for company change. 11/08/17-He is here in follow-up evaluation for left lower extremity ulcer. It is improved. Edema is significantly improved with compression therapy. We will continue with same treatment plan and he will follow-up next week. No word regarding lymphedema pumps 11/15/17-He is here in follow-up evaluation for left lower extremity ulcer. He is healed and will be discharged from wound care services. I have reached out to [facility] regarding his lymphedema pumps. They have been unable to reach the patient; the contact number they had with the patient's wife's cell phone and she has not answered any unrecognized calls. Contact should be made today, trial planned for next week; [facility] will continue to follow 11/27/17 on evaluation today patient has multiple blistered areas over the right lower extremity his left lower extremity appears to be doing okay. These blistered areas show signs of no infection which is great news. With that being said he did have some necrotic skin overlying which was mechanically debrided away with saline  and gauze today without complication. Overall post debridement the wounds appear  to be doing better but in general his swelling seems to be increased. This is obviously not good news. I think this is what has given rise to the blisters. 12/04/17 on evaluation today patient presents for follow-up concerning his bilateral lower extremity edema in the right lower extremity ulcers. He has been tolerating the dressing changes without complication. With that being said he has had no real issues with the wraps which is also good news. Overall I'm pleased with the progress he's been making. 12/11/17 on evaluation today patient appears to be doing rather well in regard to his right lateral lower extremity ulcer. He's been tolerating the dressing changes without complication. Fortunately there does not appear to be any evidence of infection at this time. Overall I'm pleased with the progress that is being made. Unfortunately he has been in the hospital due to having what sounds to be a stomach virus/flu fortunately that is starting to get better. 12/18/17 on evaluation today patient actually appears to be doing very well in regard to his bilateral lower extremities the swelling is under fairly good control his lymphedema pumps are still not up and running quite yet. With that being said he does have several areas of opening noted as far as wounds are concerned mainly over the left lower extremity. With that being said I do believe once he gets lymphedema pumps this would least help you mention some the fluid and preventing this from occurring. Hopefully that will be set up soon sleeves are Artie in place at his home he just waiting for the machine. 12/25/17 on evaluation today patient actually appears to be doing excellent in fact all of his ulcers appear to have resolved his legs appear very well. I do think he needs compression stockings we have discussed this and they are actually going to go to [place] today to elastic therapy to get this fitted for him. I think that is definitely a good  thing to do. Readmission: 04/09/18 upon evaluation today this patient is seen for readmission due to bilateral lower extremity lymphedema. He has significant swelling of his extremities especially on the left although the right is also swollen he has weeping from both sides. There are no obvious open wounds at this point. Fortunately he has been doing fairly well for quite a bit of time since I last saw him. Nonetheless unfortunately this seems to have reopened and is giving quite a bit of trouble. He states this began about a week ago when he first called [place] to get in to be seen. No fevers, chills, nausea, or vomiting noted at this time. He has not been using his lymphedema pumps due to the fact that they won't fit on his leg at this point likewise is also not been using his compression for essentially the same reason. 04/16/18 upon evaluation today patient actually appears to be doing a little better in regard to the fluid in his bilateral lower extremities. With that being said he's had three falls since I saw him last week. He also states that he's been feeling very poorly. I was concerned last week and feel like that the concern is still there as far as the congestion in his chest is concerned he seems to be breathing about the same as last week but again he states he's very weak he's not even able to walk further than from the chair to the door. His wife had  to buy a wheelchair just to be able to get them out of the house to get to the appointment today. This has me very concerned. MARKEZ, DOWLAND ([id number]) 04/23/18 on evaluation today patient actually appears to be doing much better than last week's evaluation. At that time actually had to transport him to the ER via EMS and he subsequently was admitted for acute pulmonary edema, acute renal failure, and acute congestive heart failure. Fortunately he is doing much better. Apparently they did dialyze him and were able to take off roughly 35  pounds of fluid. Nonetheless he is feeling much better both in regard to his breathing and he's able to get around much better at this time compared to previous. Overall I'm very happy with how things are at this time. There does not appear to be any evidence of infection currently. No fevers, chills, nausea, or vomiting noted at this time. 04/30/2018 patient seen today for follow-up and management of bilateral lower extremity lymphedema. He did express being more sad today than usual due to the recent loss of his dog. He states that he has been compliant with using the lymphedema pumps. However he does admit a minute over the last 2-3 days he has not been using the pumps due to the recent loss of his dog. At this time there is no drainage or open wounds to his lower extremities. The left leg edema is measuring smaller today. Still has a significant amount of edema on bilateral lower extremities With dry flaky skin. He will be referred to the lymphedema clinic for further management. Will continue 3 layer compression wraps and follow-up in 1-2 weeks.Denies any pain, fever, chairs recently. No recent falls or injuries reported during this visit. 05/07/18 on evaluation today patient actually appears to be doing very well in regard to his lower extremities in general all things considered. With that being said he is having some pain in the legs just due to the amount of swelling. He does have an area where he had a blister on the left lateral lower extremity this is open at this point other than that there's nothing else weeping at this time. 05/14/18 on evaluation today patient actually appears to be doing excellent all things considered in regard to his lower extremities. He still has a couple areas of weeping on each leg which has continued to be the issue for him. He does have an appointment with the lymphedema clinic although this isn't until February 2020. That was the earliest they had. In the  meantime he has continued to tolerate the compression wraps without complication. 05/28/18 on evaluation today patient actually appears to be doing more poorly in regard to his left lower extremity where he has a wound open at this point. He also had a fall where he subsequently injured his right great toe which has led to an open wound at the site unfortunately. He has been tolerating the dressing changes without complication in general as far as the wraps are concerned that he has not been putting any dressing on the left 1st toe ulcer site. 06/11/18 on evaluation today patient appears to be doing much worse in regard to his bilateral lower extremity ulcers. He has been tolerating the dressing changes without complication although his legs have not been wrapped more recently. Overall I am not very pleased with the way his legs appear. I do believe he needs to be back in compression wraps he still has not received his compression wraps  from the [facility] hospital as of yet. 06/18/18 on evaluation today patient actually appears to be doing significantly better than last time I saw him. He has been tolerating the compression wraps without complication in the circumferential ulcers especially appear to be doing much better. His toe ulcer on the right in regard to the great toe is better although not as good as the legs in my pinion. No fevers chills noted 07/02/18 on evaluation today patient appears to be doing much better in regard to his lower extremity ulcers. Unfortunately since I last saw him he's had the distal portion of his right great toe if he dated it sounds as if this actually went downhill very quickly. I had only seen him a few days prior and the toe did not appear to be infected at that point subsequently became infected very rapidly and it was decided by the surgeon that the distal portion of the toe needed to be removed. The patient seems to be doing well in this regard he tells me. With that  being said his lower extremities are doing better from the standpoint of the wounds although he is significantly swollen at this point. 07/09/18 on evaluation today patient appears to be doing better in regard to the wounds on his lower extremities. In fact everything is almost completely healed he is just a small area on the left posterior lower extremity that is open at this point. 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 With that being said he needs some kind of compression it does sound like he may not be wearing his compression, that is the wraps, during the entire time between when he's here visit to visit. Apparently his wife took the current one off because it began to "fall apart". 07/16/18 on evaluation today patient appears to be extremely swollen especially in regard to his left lower extremity unfortunately. He also has a new skin tear over the left lower extremity and there's a smaller area on the right lower extremity as well. Unfortunately this seems to be due in part to blistering and fluid buildup in his leg. He did get the reduction wraps that were ordered by the [facility] hospital for him to go to lymphedema clinic. With that being said his wounds on the legs have not healed to the point to where they would likely accept them as a patient lymphedema clinic currently. We need to try to get this to heal. With that being said he's been taking his wraps off which is not doing him any favors at this point. In fact this is probably quite counterproductive compared to what needs to occur. We will likely need to increase to a four layer compression wrap and continue to also utilize elevation and he has to keep the wraps on not take them off as he's been doing currently hasn't had a wrap on since Saturday. 07/23/18  on evaluation today patient appears to be doing much better in regard to his bilateral lower extremities. In fact his left lower extremity which was the largest is actually 15 cm smaller today compared to what it was last time he was here in our clinic. This is obviously good news after just one week. Nonetheless the differences he actually kept the wraps on during the entire week this time. That's not typical for  him. I do believe he understands a little bit better now the severity of the situation and why it's important for him to keep these wraps on. 07/30/18 on evaluation today patient actually appears to be doing rather well in regard to his lower extremities. His legs are much smaller than they have been in the past and he actually has only one very small rudder superficial region remaining that is not closed on the left lateral/posterior lower extremity even this is almost completely close. I do believe likely next week he will be healed without any complications. I do think we need to continue the wraps however this seems to be beneficial for him. I also think it may be a good time for [place] to go ahead and see about getting the appointment with the lymphedema clinic which is supposed to be made for him in order to keep this moving along and hopefully get them into compression wraps that will in the end help him to remain healed. 08/06/18 on evaluation today patient actually appears to be doing very well in regard as bilateral lower extremities. In fact his wounds appear to be completely healed at this time. 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 He is in the process of getting appointment with the lymphedema clinic we have made this referral were just waiting to hear back on the schedule time. We need to follow up on that today as well. 08/13/18 on evaluation today patient actually appears to be doing very well in regard to his bilateral  lower extremities there are no open wounds at this point. We are gonna go ahead and see about ordering the Velcro compression wraps for him had a discussion with them about [place] doing it versus the [place] they feel like they can definitely afford going ahead and get the wraps themselves and they would prefer to try to avoid having to go to the lymphedema clinic if it all possible which I completely understand. As long as he has good compression I'm okay either way. FINAS, DELONE ([id number]) 3/17//20 on evaluation today patient actually appears to be doing well in regard to his bilateral lower extremity ulcers. He has been tolerating the dressing changes without complication specifically the compression wraps. Overall is had no issues my fingers finding that I see at this point is that he is having trouble with constipation. He tells me he has not been able to go to the bathroom for about six days. He's taken to over-the- counter oral laxatives unfortunately this is not helping. He has not contacted his doctor. 08/27/18 on evaluation today patient appears to be doing fairly well in regard to his lower extremities at this point. There does not appear to be any new altars is swelling is very well controlled. We are still waiting for his Velcro compression wraps to arrive that should be sometime in the next week is Artie sent the check for these. 09/03/18 on evaluation today patient appears to be doing excellent in regard to his bilateral lower extremities which he shows no signs of wound openings in regard to this point. He does have his Velcro compression wraps which did arrive in the mail since I saw him last week. Overall he is doing excellent in my pinion. 09/13/18 on evaluation today patient appears to be doing very well currently in regard to the overall appearance of his bilateral lower extremities although he's a little bit more swollen than last time  we saw him. At that point he been discharged  without any open wounds. Nonetheless he has a small open wound on the posterior left lower extremity with some evidence of cellulitis noted as well. Fortunately I feel like he has made good progress overall with regard to his lower extremities from were things used to be. 09/20/18 on evaluation today patient actually appears to be doing much better. The erythematous lower extremity is improving wound itself which is still open appears to be doing much better as far as but appearance as well as pain is concerned overall very pleased in this regard. There's no signs of active infection at this time. 09/27/18 on evaluation today patient's wounds on the lower extremity actually appear to be doing fairly well at this time which is good news. There is no evidence of active infection currently and again is just as left lower extremity were there any wounds at this point anyway. I believe they may be completely healed but again I'm not 100% sure based on evaluation today. I think one more week of observation would likely be a good idea. 10/04/18 on evaluation today patient actually appears to be doing excellent in regard to the left lower extremity which actually appears to be completely healed as of today. Unfortunately he's been having issues with his right lower extremity have a new wound that has opened. Fortunately there's no evidence of active infection at this time which is good news. 10/10/18 upon evaluation today patient actually appears to be doing a little bit worse with the new open area on his right posterior lower extremity. He's been tolerating the dressing changes without complication. Right now we been using his compression wraps although I think we may need to switch back to actually performing bilateral compression wraps are in the clinic. No fevers, chills, nausea, or vomiting noted at this time. 10/17/18 on evaluation today patient actually appears to be doing quite well in regard to his  bilateral lower extremity ulcers. He has been tolerating the reps without complication although he would prefer not to rewrap his legs as of today. Fortunately there's no signs of active infection which is good news. No fevers, chills, nausea, or vomiting noted at this time. 10/24/18 on evaluation today patient appears to be doing very well in regard to his lower extremities. His right lower extremity is shown signs of healing and his left lower Trinity though not healed appears to be improving which is excellent news. Overall very pleased with how things seem to be progressing at this time. The patient is likewise happy to hear this. His Velcro compression wraps however have not been put on properly will gonna show his wife how to do that properly today. 10/31/18 on evaluation today patient appears to be doing more poorly in regard to his left lower extremity in particular although both lower extremities actually are showing some signs of being worse than my previous evaluation. Unfortunately I'm just not sure that his compression stockings even with the use of the compression/lymphedema pumps seem to be controlling this well. Upon further questioning he tells me that he also is not able to lie flat in the bed due to his congestive heart failure and difficulty breathing. For that reason he sleeps in his recliner. To make matters worse his recliner also cannot even hold his legs up so instead of even being somewhat elevated they pretty much hang down to the floor. This is the way he sleeps each night which is definitely  counterproductive to everything else that were attempting to do from the standpoint of controlling his fluid. Nonetheless I think that he potentially could benefit from a hospital bed although this would be something that his primary care provider would likely have to order since anything that is order on our side has to be directly related to wound care and again the hospital bed is not  necessarily a direct relation to although I think it does contribute to his overall wound status on his lower extremities. 11/07/18 on evaluation today patient appears to be doing better in regard to his bilateral lower extremity. He's been tolerating the dressing changes without complication. We did in the interim since I last saw him switch to just using extras orbiting new alginate and that seems to have done much better for him. I'm very pleased with the overall progress that is made. 11/14/18 on evaluation today patient appears to be redoing rather well in regard to his left lower extremity ulcers which are the only ones remaining at this point. Fortunately there's no signs of active infection at this time which is good news. Overall been very pleased with how things seem to be progressing currently. No fevers, chills, nausea, or vomiting noted at this time. 11/20/17 on evaluation today patient actually appears to be doing quite well in regard to his lower extremities on the left on the right he has several blisters that showed up although there's some question about whether or not he has had his broker compression wraps on like he was supposed to or not. Fortunately there's no signs of active infection at this time which is good news. Unfortunately though he is doing well from the standpoint of the left leg the right leg is not doing as well again this is when we did not wrap last week. 11/28/18 on evaluation today patient appears to be doing well in regard to his bilateral lower extremities is left appears to be healed is right is not healed but is very close to being so. Overall very pleased with how things seem to be progressing. Patient is likewise happy that things are doing well. 12/09/18 on evaluation today patient actually appears to be completely healed which is excellent news. He actually seems to be doing well in regard to the swelling of the bilateral lower extremities which is also great  news. Overall very pleased with how things seem to be progressing. Readmission: 03/18/2019 upon evaluation today patient presents for reevaluation concerning issues that he is having with his left lower extremity where he does have a wound noted upon inspection today. He also has a wound on the right second toe on the tip where it is apparently been rubbing in his shoe I did look at issues and you can actually see where this has been occurring as well. Fortunately there is no signs of infection with regard to JVON, MERONEY. ([id number]) the toe necessarily although it is very swollen compared to normal that does have me somewhat concerned about the possibility of further evaluation for infection/osteomyelitis. I would recommend an x-ray to start with. Otherwise he states that it is been 1-2 weeks that he has had the draining in regard to left lower extremity he does not know how this happened he has been wearing his compression appropriately and I do see that as well today but nonetheless I do think that he needs to continue to be very cautious with regard to elevation as well. 03/25/2019 on evaluation today patient appears  to be doing much better in regard to his lower extremity at this time. That is on the left. He did culture positive for Pseudomonas but the good news is he seems to be doing much better the gentamicin we applied topically seems to have done a great job for him. There is no signs of active infection at this time systemically and locally he is doing much better. No fevers, chills, nausea, vomiting, or diarrhea. In regard to his right toe this seems to be doing much better there is very little pressure noted at this point I did clean up some of the callus today around the edges of the wound as well as the surface of the wound but to be honest he is progressing quite nicely. 10/30; the area on the left anterior lower tibia area is healed over. His edema control is adequate even though  his use of the compression pumps seems very intermittent. He has a juxta lite stocking on the right leg and a think there is 1 for the left leg at home. His wife is putting these on. He has an area on the plantar right second toe which is a hammertoe they are trying to offload this 04/08/2019 on evaluation today patient appears to be doing well in regard to his lower extremities which is good news. We are seeing him today for his toe ulcer which is giving him a little bit more trouble but again still seems to be doing better at this point which is good news. He is going require some sharp debridement in regard to the toe today however. 04/15/2019 on evaluation today patient actually appears to be doing quite well with regard to his toe ulcer. I am very pleased with how things are doing in that regard. With regard to his lower extremity edema in general he tells me he is not been using the lymphedema pumps regularly although he does not use them. I think that he needs to use them more regularly he does have a small blister on the left lower extremity this is not open at this point but obviously it something that can get worse if he does not keep the compression going and the pumps going as well. 04/22/2019 on evaluation today patient appears to be doing well with regard to his toe ulcer. He has been tolerating the dressing changes without complication. This is measuring slightly smaller this week as compared to last week. Fortunately there is no signs of active infection at this time. 05/06/2019 on evaluation today patient actually appears to be doing excellent. In fact his toe appears to be completely healed which is great news. There is no signs of active infection at this time. Readmission: Patient presents today for follow-up after having been discharged at the beginning of December 2020. He states that in recent weeks things have reopened and has been having more trouble at this point. With that  being said fortunately there is no signs of active infection at this time. No fever chills noted. Nonetheless he also does have an issue with one of his toes as well that seems to be somewhat this is on the right foot second toe. 07/25/2019 upon evaluation today patient's lower extremities appear to be doing excellent bilaterally. I really feel like he is showing signs of great improvement and in fact I think he is close to healing which is great news. There is no signs of active infection at this time. No fevers, chills, nausea, vomiting, or diarrhea.  07/31/2019 upon evaluation today patient appears to be doing excellent and in fact I think may be completely healed in regard to his right lower extremity that were still to monitor this 1 more week before closing it out. The left lower extremity is measuring smaller although not completely closed seems to be doing excellent. 08/07/2019 upon evaluation today patient appears to be doing excellent in regard to his wounds. In fact the right lower extremity is completely healed there is no issues here. The left lower extremity has 1 very small area still remaining fortunately there is no signs of infection and overall I think he is very close to closure of the here as well. 08/14/19 upon evaluation today patient appears to be doing excellent in regard to his lower extremities. In fact he appears to be completely healed as of today. Fortunately there's no signs of active infection at this time. No fevers, chills, nausea, or vomiting noted at this time. READMISSION 09/26/2019 This is a 75 year old man who is well-known to this clinic having just been discharged on 08/14/2019. He has known lymphedema and chronic venous insufficiency. He has [place] wrap stockings and also external compression pumps. He does not use the external compression pumps however. He does however apparently fairly faithfully use the stockings. They have not been lotion in his legs. He has  developed new wounds on the right anterior as well as the left medial left lateral and left posterior calf. He returns for our review of this Past medical history includes coronary artery disease, hypertension, congestive heart failure, COPD, venous insufficiency with lymphedema, type 2 diabetes. He apparently has compression pumps, [place] wraps and at 1 point a hospital bed although I believe he sleeps in a recliner 10/03/2019 upon evaluation today patient appears to be doing better with regard to his wounds in general. He has been tolerating the dressing changes without complication. Fortunately there is no signs of active infection. No fevers, chills, nausea, vomiting, or diarrhea. I do believe the compression wraps are helping as they always have in the past. 10/10/2019 upon evaluation today patient appears to be doing excellent at this point. His right lower extremity is measuring much better and in fact is completely healed. His left lower extremity is also measuring better though not healed seems to be doing excellent at this point which is great news. Overall I am very pleased with how things appear currently. 10/27/2019 upon evaluation today patient appears to be doing quite well with regard to his wounds currently. He has been tolerating the dressing changes without complication. Fortunately there is no signs of active infection at this time. In fact he is almost completely healed on the left and has just a very small area open and on the right he is completely healed. 11/04/2019 upon evaluation today patient appears to be doing quite well with regard to his wounds. In fact he appears to be quite possibly completely healed on the left lower extremity now as well the right lower extremity is still doing well. With that being said unfortunately though this may be healed I think it is a very fragile area and I cannot even confirm there is not a very small opening still remaining. I really think  he may benefit from 1 additional weeks wrapping before discontinuing. KYRON, SCHLITT ([id number]) 11/10/2019 upon evaluation today patient appears to be doing well in regard to his original wounds in fact everything that we were treating last week is completely healed on the left. Unfortunately  he has a new skin tear just above where the wrap slipped down due to a fall he sustained causing this injury. 11/17/2019 on evaluation today patient appears to be doing better with regard to his wound. He has been tolerating the dressing changes without complication. Fortunately there is no signs of active infection at this time. No fevers, chills, nausea, vomiting, or diarrhea. 11/24/2019 upon evaluation today patient appears to be doing well with regard to his wound. This is making good progress there does not appear to be signs of active infection but overall I do feel like he is headed in the correct direction. Overall he is tolerating the compression wrap as well. 12/02/2019 upon evaluation today patient appears to be doing well with regard to his leg ulcer. In fact this appears to be completely healed and this is the last of the wounds that he had but still the left anterior lower extremity. Fortunately there is no signs of active infection at this time. No fevers, chills, nausea, vomiting, or diarrhea. Readmission: 02/05/2020 on evaluation today patient appears to be doing well with regard to his wound all things considered. He actually tells me that a couple weeks ago he had trouble with his wraps and therefore took him off for a couple of days in order to give his legs a break. It was during this time that he actually developed increased swelling and edema of his legs and blisters on both legs. That is when he called to make the appointment. Nonetheless since that time he began wearing his compression wraps which are Velcro in the meantime and has done extremely well with this. In fact his leg appears to  be under good control as far as edema is concerned today it is nothing like what I am seeing when he has come in for evaluations in the past. They have been using some of the silver alginate at home which has been beneficial for him. Fortunately there is no signs of active infection at this time. No fevers, chills, nausea, vomiting, or diarrhea. 02/19/2020 on evaluation today patient unfortunately does not appear to be doing quite as well as I would like to see. He has no signs of active infection at this time but unfortunately he is not doing well in regard to the overall appearance of his left leg. He has a couple other areas that are weeping now and the leg is much more swollen. I think that he needs to actually have a compression wrap to try to manage this. 02/26/2020 on evaluation today patient appears to be doing well with regard to his left lower extremity ulcer. Fortunately the swelling is down I do believe the pressure wrap seems to be doing quite well. Fortunately there is no signs of active infection at this time. 03/05/2020 upon evaluation today patient appears to be doing excellent in regard to his legs at this point. Fortunately there is no signs of active infection which is great news. Overall he feels like he is completely healed which is great news. Readmission: 05/20/2020 upon evaluation today patient appears to be doing well with regard to his leg ulcer all things considered. He is actually coming back to see [place] after having had a reopening of the ulcers on his left leg in the past several weeks. He is out of dressing supplies and his wife is been try to take care of this as best she can. 06/10/2020 upon evaluation today patient unfortunately appears to be doing significantly worse today  compared to when I last saw him. He and his wife both tell me that "there has been a lot going on". I did not actually go into detail about everything that has been happening but nonetheless he has not  obviously been wearing his compression. He brings them with him today but they were not on. I am not even certain to be honest that he could get those on at this point. Nonetheless I do believe that he is going to require compression wrapping at some point shortly but right now we need to try to get what appears to be infection under control at this time. 06/28/2020 upon evaluation today patient appears to be doing well with regard to 06/28/2020 upon evaluation today patient appears to be doing well pretty well in regard to his left leg which is much better than previous. With that being said in regard to his right leg he does seem to be having some issues with a new wound after he sustained a fall. This fortunately is not too deep but does appear to be something we need to address. Neither deep which is good. 07/05/2020 on evaluation today patient appears to be doing well with regard to his wounds. In fact everything on the left leg appears to be pretty much healed on the right leg this is very close though not completely closed yet. Fortunately there is no evidence of active infection at this time. No fevers, chills, nausea, vomiting, or diarrhea. 07/13/2019 upon evaluation today patient appears to be making good progress which is great news. Overall I am extremely pleased with where things stand today. There does not appear to be any signs of active infection which is great news and overall his left leg appears healed still the right leg though not healed does seem to be making good progress which is excellent news. He is having no pain. 07/19/2020 on evaluation today patient appears to be doing well with regard to his wound. He has been tolerating the dressing changes without complication. Fortunately there does not appear to be any signs of active infection at this time. No fevers, chills, nausea, vomiting, or diarrhea. Readmission: 08/20/2020 on evaluation today patient presents for reevaluation here  in the clinic concerning issues that he has been having with his bilateral lower extremities. Unfortunately this is an ongoing reoccurring issue. He tells me that he is really not certain exactly what caused the issue although as I discussed this further with him it appears that he has been sitting for the most part and sleeping in his chair with his feet on the ground he is not really elevating at all in fact the chair does not even have a reclining function therefore it is basically just him sitting with his feet on the ground. I think this alone has probably led to his increased swelling he is also having more difficulty walking which means that he is pumping less blood flow through his legs anyway as far as the venous stasis is concerned. All in all I think this is leading to a downward spiral of him not being able to walk very well as his legs are so heavy, they begin to weep, and overall he just does not seem to be doing as well as it was when I last saw him. 08/27/2020 upon evaluation today patient's legs actually are showing signs of improvement which is great news. There does not appear to be any evidence of infection which is also excellent news. I am  extremely pleased with where things stand today. No fevers, chills, nausea, vomiting, or diarrhea. 09/03/20 upon evaluation today patient appears to be doing excellent in regard to his wounds currently. He has been tolerating the dressing changes without complication and overall I am extremely pleased with where things stand today. No fevers, chills, nausea, vomiting, or diarrhea. Overall he is healed on the right leg although the left leg not being completely healed still is doing significantly better. TEIGEN, BELLIN ([id number]) Objective Constitutional Obese and well-hydrated in no acute distress. Vitals Time Taken: 1:04 PM, Temperature: 97.8 F, Pulse: 60 bpm, Respiratory Rate: 18 breaths/min, Blood Pressure: 158/82  mmHg. Respiratory normal breathing without difficulty. Psychiatric this patient is able to make decisions and demonstrates good insight into disease process. Alert and Oriented x 3. pleasant and cooperative. General Notes: Upon inspection patient's wounds again showed signs of almost complete resolution he just has a couple leaking areas on the left leg his right leg is actually completely healed and seems to be doing excellent. There is no signs of active infection at this time. Other Condition(s) Patient presents with Lymphedema located on the Right Leg. Patient presents with Lymphedema located on the Left Leg. Assessment Active Problems ICD-10 Lymphedema, not elsewhere classified Venous insufficiency (chronic) (peripheral) Procedures There was a Four Layer Compression Therapy Procedure by Carlene Coria, RN. Post procedure Diagnosis Wound #: Same as Pre-Procedure Plan Follow-up Appointments: Return Appointment in 1 week. Bathing/ Shower/ Hygiene: May shower with wound dressing protected with water repellent cover or cast protector. Edema Control - Lymphedema / Segmental Compressive Device / Other: 4 Layer Compression System (Left, Right, Bilateral) Lymphedema. - abd pad , left lower leg Patient to wear own Velcro compression garment. Remove compression stockings every night before going to bed and put on every morning when getting up. - right lower leg with tubi grip E Elevate, Exercise Daily and Avoid Standing for Long Periods of Time. Elevate legs to the level of the heart and pump ankles as often as possible Elevate leg(s) parallel to the floor when sitting. OLSEN, MCCUTCHAN. ([id number]) 1. Would recommend currently that we going to continue with the 4-layer compression wrap to left lower extremity. We will use ABD pads to cover which seem to be I think appropriate he is not draining too much at this point. 2. Muscle can recommend that we have him put his Velcro wraps on the right  leg to see how well this holds up over the next week. 3. I am also can recommend that we have the patient elevate his legs much as possible at home to help with edema control. We will see patient back for reevaluation in 1 week here in the clinic. If anything worsens or changes patient will contact our office for additional recommendations. Electronic Signature(s) Signed: 09/03/2020 1:53:23 PM By: Worthy Keeler PA-C Entered By: Worthy Keeler on 09/03/2020 13:53:23 Adam Merritt ([id number]) -------------------------------------------------------------------------------- SuperBill Details Patient Name: Adam Merritt Date of Service: 09/03/2020 Medical Record Number: [id number] Patient Account Number: [id number] Date of Birth/Sex: [birth year] (75 y.o. M) Treating RN: Carlene Coria Primary Care Provider: [name] Other Clinician: Jeanine Luz Referring Provider: [name] Treating Provider/Extender: Jeri Cos [name]: 2 Diagnosis Coding ICD-10 Codes Code Description I89.0 Lymphedema, not elsewhere classified I87.2 Venous insufficiency (chronic) (peripheral) Facility Procedures CPT4 Code: [id number] Description: (Facility Use Only) [phone] - APPLY MULTLAY COMPRS LWR LT LEG Modifier: Quantity: 1 Physician Procedures CPT4 Code: 4967591 Description: 63846 - WC PHYS LEVEL  3 - EST PT Modifier: Quantity: 1 CPT4 Code: Description: ICD-10 Diagnosis Description I89.0 Lymphedema, not elsewhere classified I87.2 Venous insufficiency (chronic) (peripheral) Modifier: Quantity: Electronic Signature(s) Signed: 09/03/2020 1:53:34 PM By: Worthy Keeler PA-C Entered By: Worthy Keeler on 09/03/2020 13:53:33

## 2020-09-04 DIAGNOSIS — I509 Heart failure, unspecified: Secondary | ICD-10-CM | POA: Diagnosis not present

## 2020-09-10 ENCOUNTER — Encounter: Payer: Medicare PPO | Admitting: Physician Assistant

## 2020-09-10 ENCOUNTER — Other Ambulatory Visit: Payer: Self-pay

## 2020-09-10 DIAGNOSIS — L97822 Non-pressure chronic ulcer of other part of left lower leg with fat layer exposed: Secondary | ICD-10-CM | POA: Diagnosis not present

## 2020-09-10 DIAGNOSIS — I509 Heart failure, unspecified: Secondary | ICD-10-CM | POA: Diagnosis not present

## 2020-09-10 DIAGNOSIS — I890 Lymphedema, not elsewhere classified: Secondary | ICD-10-CM | POA: Diagnosis not present

## 2020-09-10 DIAGNOSIS — L97812 Non-pressure chronic ulcer of other part of right lower leg with fat layer exposed: Secondary | ICD-10-CM | POA: Diagnosis not present

## 2020-09-10 DIAGNOSIS — E11621 Type 2 diabetes mellitus with foot ulcer: Secondary | ICD-10-CM | POA: Diagnosis not present

## 2020-09-10 DIAGNOSIS — I110 Hypertensive heart disease with heart failure: Secondary | ICD-10-CM | POA: Diagnosis not present

## 2020-09-10 DIAGNOSIS — S99921A Unspecified injury of right foot, initial encounter: Secondary | ICD-10-CM | POA: Diagnosis not present

## 2020-09-10 DIAGNOSIS — J449 Chronic obstructive pulmonary disease, unspecified: Secondary | ICD-10-CM | POA: Diagnosis not present

## 2020-09-10 DIAGNOSIS — I872 Venous insufficiency (chronic) (peripheral): Secondary | ICD-10-CM | POA: Diagnosis not present

## 2020-09-10 NOTE — Progress Notes (Signed)
Adam, Merritt ([id number]) Visit Report for 08/20/2020 Abuse/Suicide Risk Screen Details Patient Name: Adam Merritt, Adam Merritt Date of Service: 08/20/2020 9:30 AM Medical Record Number: [id number] Patient Account Number: [id number] Date of Birth/Sex: 1945/10/17 (75 y.o. M) Treating RN: Carlene Coria Primary Care Marcia Hartwell: [name] Other Clinician: Jeanine Luz Referring Henrietta Cieslewicz: [name] Treating Avenell Sellers/Extender: Jeri Cos [name]: 0 Abuse/Suicide Risk Screen Items Answer ABUSE RISK SCREEN: Has anyone close to you tried to hurt or harm you recentlyo No Do you feel uncomfortable with anyone in your familyo No Has anyone forced you do things that you didnot want to doo No Electronic Signature(s) Signed: 08/20/2020 2:55:19 PM By: Jeanine Luz Signed: 09/10/2020 8:11:40 AM By: Carlene Coria RN Entered By: Jeanine Luz on 08/20/2020 09:57:18 Adam Merritt ([id number]) -------------------------------------------------------------------------------- Activities of Daily Living Details Patient Name: Adam Merritt Date of Service: 08/20/2020 9:30 AM Medical Record Number: [id number] Patient Account Number: [id number] Date of Birth/Sex: [birth year] (75 y.o. M) Treating RN: Carlene Coria Primary Care Ileanna Gemmill: [name] Other Clinician: Jeanine Luz Referring Meridee Branum: [name] Treating Arch Methot/Extender: Skipper Cliche in Treatment: 0 Activities of Daily Living Items Answer Activities of Daily Living (Please select one for each item) Drive Automobile Not Able Take Medications Need Assistance Use Telephone Completely Able Care for Appearance Need Assistance Use Toilet Need Assistance Bath / Shower Need Assistance Dress Self Need Assistance Feed Self Need Assistance Walk Need Assistance Get In / Out Bed Need Assistance Housework Need Assistance Prepare Meals Need Assistance Handle Money Need Assistance Shop for Self Need Assistance Electronic  Signature(s) Signed: 08/20/2020 2:55:19 PM By: Jeanine Luz Signed: 09/10/2020 8:11:40 AM By: Carlene Coria RN Entered By: Jeanine Luz on 08/20/2020 09:58:46 Adam Merritt ([id number]) -------------------------------------------------------------------------------- Education Screening Details Patient Name: Adam Merritt Date of Service: 08/20/2020 9:30 AM Medical Record Number: [id number] Patient Account Number: [id number] Date of Birth/Sex: [birth year] (75 y.o. M) Treating RN: Carlene Coria Primary Care Veverly Larimer: [name] Other Clinician: Jeanine Luz Referring Geza Beranek: [name] Treating Baby Gieger/Extender: Skipper Cliche in Treatment: 0 Primary Learner Assessed: Patient Learning Preferences/Education Level/Primary Language Learning Preference: Explanation, Demonstration Highest Education Level: High School Preferred Language: English Cognitive Barrier Language Barrier: No Translator Needed: No Memory Deficit: No Emotional Barrier: No Cultural/Religious Beliefs Affecting Medical Care: No Physical Barrier Impaired Vision: No Impaired Hearing: Yes Hearing Aid Decreased Hand dexterity: No Knowledge/Comprehension Knowledge Level: Medium Comprehension Level: Medium Ability to understand written instructions: Medium Ability to understand verbal instructions: Medium Motivation Anxiety Level: Calm Cooperation: Cooperative Education Importance: Acknowledges Need Interest in Health Problems: Asks Questions Perception: Coherent Willingness to Engage in Self-Management High Activities: Readiness to Engage in Self-Management High Activities: Electronic Signature(s) Signed: 08/20/2020 2:55:19 PM By: Jeanine Luz Signed: 09/10/2020 8:11:40 AM By: Carlene Coria RN Entered By: Jeanine Luz on 08/20/2020 09:59:55 Adam Merritt ([id number]) -------------------------------------------------------------------------------- Fall Risk Assessment Details Patient Name:  Adam Merritt Date of Service: 08/20/2020 9:30 AM Medical Record Number: [id number] Patient Account Number: [id number] Date of Birth/Sex: Sep 14, 1945 (75 y.o. M) Treating RN: Carlene Coria Primary Care Jahni Paul: [name] Other Clinician: Jeanine Luz Referring Zachary Lovins: [name] Treating Jeziel Hoffmann/Extender: Skipper Cliche in Treatment: 0 Fall Risk Assessment Items Have you had 2 or more falls in the last 12 monthso 0 Yes Have you had any fall that resulted in injury in the last 12 monthso 0 No FALLS RISK SCREEN History of falling - immediate or within 3 months 25 Yes Secondary diagnosis (Do you have 2 or more medical diagnoseso)  15 Yes Ambulatory aid None/bed rest/wheelchair/nurse 0 Yes Crutches/cane/walker 0 No Furniture 0 No Intravenous therapy Access/Saline/Heparin Lock 0 No Gait/Transferring Normal/ bed rest/ wheelchair 0 Yes Weak (short steps with or without shuffle, stooped but able to lift head while walking, may 0 No seek support from furniture) Impaired (short steps with shuffle, may have difficulty arising from chair, head down, impaired 0 No balance) Mental Status Oriented to own ability 0 No Electronic Signature(s) Signed: 08/20/2020 2:55:19 PM By: Jeanine Luz Signed: 09/10/2020 8:11:40 AM By: Carlene Coria RN Entered By: Jeanine Luz on 08/20/2020 10:00:23 Adam Merritt ([id number]) -------------------------------------------------------------------------------- Foot Assessment Details Patient Name: Adam Merritt Date of Service: 08/20/2020 9:30 AM Medical Record Number: [id number] Patient Account Number: [id number] Date of Birth/Sex: 1945-09-28 (75 y.o. M) Treating RN: Carlene Coria Primary Care Jada Kuhnert: [name] Other Clinician: Jeanine Luz Referring Dustan Hyams: [name] Treating Aharon Carriere/Extender: Jeri Cos [name]: 0 Foot Assessment Items Site Locations + = Sensation present, - = Sensation absent, C = Callus, U =  Ulcer R = Redness, W = Warmth, M = Maceration, PU = Pre-ulcerative lesion F = Fissure, S = Swelling, D = Dryness Assessment Right: Left: Other Deformity: No No Prior Foot Ulcer: No No Prior Amputation: No No Charcot Joint: No No Ambulatory Status: Gait: Electronic Signature(s) Signed: 08/20/2020 2:55:19 PM By: Jeanine Luz Signed: 09/10/2020 8:11:40 AM By: Carlene Coria RN Entered By: Jeanine Luz on 08/20/2020 10:04:21 Adam Merritt ([id number]) -------------------------------------------------------------------------------- Nutrition Risk Screening Details Patient Name: Adam Merritt Date of Service: 08/20/2020 9:30 AM Medical Record Number: [id number] Patient Account Number: [id number] Date of Birth/Sex: 08-Feb-1946 (75 y.o. M) Treating RN: Carlene Coria Primary Care Eliam Snapp: [name] Other Clinician: Jeanine Luz Referring Zaivion Kundrat: [name] Treating Arnetia Bronk/Extender: Jeri Cos [name]: 0 Height (in): 66 Weight (lbs): 288 Body Mass Index (BMI): 46.5 Nutrition Risk Screening Items Score Screening NUTRITION RISK SCREEN: I have an illness or condition that made me change the kind and/or amount of food I eat 0 No I eat fewer than two meals per day 0 No I eat few fruits and vegetables, or milk products 0 No I have three or more drinks of beer, liquor or wine almost every day 0 No I have tooth or mouth problems that make it hard for me to eat 0 No I don't always have enough money to buy the food I need 0 No I eat alone most of the time 0 No I take three or more different prescribed or over-the-counter drugs a day 1 Yes Without wanting to, I have lost or gained 10 pounds in the last six months 0 No I am not always physically able to shop, cook and/or feed myself 0 No Nutrition Protocols Good Risk Protocol 0 No interventions needed Moderate Risk Protocol High Risk Proctocol Risk Level: Good Risk Score: 1 Electronic Signature(s) Signed: 08/20/2020  2:55:19 PM By: Jeanine Luz Signed: 09/10/2020 8:11:40 AM By: Carlene Coria RN Entered By: Jeanine Luz on 08/20/2020 10:01:13

## 2020-09-10 NOTE — Progress Notes (Signed)
Adam Merritt, Adam Merritt ([id number]) Visit Report for 08/20/2020 Chief Complaint Document Details Patient Name: Adam Merritt, Adam Merritt Date of Service: 08/20/2020 9:30 AM Medical Record Number: [id number] Patient Account Number: [id number] Date of Birth/Sex: 1946-02-08 (75 y.o. M) Treating RN: Carlene Coria Primary Care Provider: [name] Other Clinician: Jeanine Luz Referring Provider: [name] Treating Provider/Extender: Skipper Cliche in Treatment: 0 Information Obtained from: Patient Chief Complaint Bilateral LE Ulcers Electronic Signature(s) Signed: 08/20/2020 10:56:38 AM By: Worthy Keeler PA-C Entered By: Worthy Keeler on 08/20/2020 10:56:37 Adam Merritt ([id number]) -------------------------------------------------------------------------------- HPI Details Patient Name: Adam Merritt Date of Service: 08/20/2020 9:30 AM Medical Record Number: [id number] Patient Account Number: [id number] Date of Birth/Sex: [birth year] (75 y.o. M) Treating RN: Carlene Coria Primary Care Provider: [name] Other Clinician: Jeanine Luz Referring Provider: [name] Treating Provider/Extender: Skipper Cliche in Treatment: 0 History of Present Illness HPI Description: 10/18/17-He is here for initial evaluation of bilateral lower extremity ulcerations in the presence of venous insufficiency and lymphedema. He has been seen by vascular medicine in the past, Dr. Lucky Cowboy, last seen in 2016. He does have a history of abnormal ABIs, which is to be expected given his lymphedema and venous insufficiency. According to Epic, it appears that all attempts for arterial evaluation and/or angiography were not follow through with by patient. He does have a history of being seen in lymphedema clinic in 2018, stopped going approximately 6 months ago stating "it didn't do any good". He does not have lymphedema pumps, he does not have custom fit compression wrap/stockings. He is diabetic and his recent A1c last  month was 7.6. He admits to chronic bilateral lower extremity pain, no change in pain since blister and ulceration development. He is currently being treated with Levaquin for bronchitis. He has home health and we will continue. 10/25/17-He is here in follow-up evaluation for bilateral lower extremity ulcerationssubtle he remains on Levaquin for bronchitis. Right lower extremity with no evidence of drainage or ulceration, persistent left lower extremity ulceration. He states that home health has not been out since his appointment. He went to [facility] on Tuesday, studies revealed: RIGHT ABI 0.9, TBI 0.6 LEFT ABI 1.1, TBI 0.6 with triphasic flow bilaterally. We will continue his same treatment plan. He has been educated on compression therapy and need for elevation. He will benefit from lymphedema pumps 11/01/17-He is here in follow-up evaluation for left lower extremity ulcer. The right lower extremity remains healed. He has home health services, but they have not been out to see the patient for 2-3 weeks. He states it home health physical therapy changed his dressing yesterday after therapy; he placed Ace wrap compression. We are still waiting for lymphedema pumps, reordered d/t need for company change. 11/08/17-He is here in follow-up evaluation for left lower extremity ulcer. It is improved. Edema is significantly improved with compression therapy. We will continue with same treatment plan and he will follow-up next week. No word regarding lymphedema pumps 11/15/17-He is here in follow-up evaluation for left lower extremity ulcer. He is healed and will be discharged from wound care services. I have reached out to [facility] regarding his lymphedema pumps. They have been unable to reach the patient; the contact number they had with the patient's wife's cell phone and she has not answered any unrecognized calls. Contact should be made today, trial planned for next week; [facility] will continue to follow 11/27/17 on evaluation today patient has multiple blistered areas over the  right lower extremity his left lower extremity appears to be doing okay. These blistered areas show signs of no infection which is great news. With that being said he did have some necrotic skin overlying which was mechanically debrided away with saline and gauze today without complication. Overall post debridement the wounds appear to be doing better but in general his swelling seems to be increased. This is obviously not good news. I think this is what has given rise to the blisters. 12/04/17 on evaluation today patient presents for follow-up concerning his bilateral lower extremity edema in the right lower extremity ulcers. He has been tolerating the dressing changes without complication. With that being said he has had no real issues with the wraps which is also good news. Overall I'm pleased with the progress he's been making. 12/11/17 on evaluation today patient appears to be doing rather well in regard to his right lateral lower extremity ulcer. He's been tolerating the dressing changes without complication. Fortunately there does not appear to be any evidence of infection at this time. Overall I'm pleased with the progress that is being made. Unfortunately he has been in the hospital due to having what sounds to be a stomach virus/flu fortunately that is starting to get better. 12/18/17 on evaluation today patient actually appears to be doing very well in regard to his bilateral lower extremities the swelling is under fairly good control his lymphedema pumps are still not up and running quite yet. With that being said he does have several areas of opening noted as far as wounds are concerned mainly over the left lower extremity. With that being said I do believe once he gets lymphedema pumps this would least help you mention some the fluid and preventing this from occurring. Hopefully that  will be set up soon sleeves are Artie in place at his home he just waiting for the machine. 12/25/17 on evaluation today patient actually appears to be doing excellent in fact all of his ulcers appear to have resolved his legs appear very well. I do think he needs compression stockings we have discussed this and they are actually going to go to [place] today to elastic therapy to get this fitted for him. I think that is definitely a good thing to do. Readmission: 04/09/18 upon evaluation today this patient is seen for readmission due to bilateral lower extremity lymphedema. He has significant swelling of his extremities especially on the left although the right is also swollen he has weeping from both sides. There are no obvious open wounds at this point. Fortunately he has been doing fairly well for quite a bit of time since I last saw him. Nonetheless unfortunately this seems to have reopened and is giving quite a bit of trouble. He states this began about a week ago when he first called [place] to get in to be seen. No fevers, chills, nausea, or vomiting noted at this time. He has not been using his lymphedema pumps due to the fact that they won't fit on his leg at this point likewise is also not been using his compression for essentially the same reason. 04/16/18 upon evaluation today patient actually appears to be doing a little better in regard to the fluid in his bilateral lower extremities. With that being said he's had three falls since I saw him last week. He also states that he's been feeling very poorly. I was concerned last week and feel like that the concern is still there as far as the  congestion in his chest is concerned he seems to be breathing about the same as last week but again he states he's very weak he's not even able to walk further than from the chair to the door. His wife had to buy a wheelchair just to be able to get them out of the house to get to the appointment today. This  has me very concerned. 04/23/18 on evaluation today patient actually appears to be doing much better than last week's evaluation. At that time actually had to transport him to the ER via EMS and he subsequently was admitted for acute pulmonary edema, acute renal failure, and acute congestive heart failure. Fortunately he is doing much better. Apparently they did dialyze him and were able to take off roughly 35 pounds of fluid. Nonetheless he is feeling much better both in regard to his breathing and he's able to get around much better at this time compared to previous. Overall I'm very Adam Merritt, Adam Merritt ([id number]) happy with how things are at this time. There does not appear to be any evidence of infection currently. No fevers, chills, nausea, or vomiting noted at this time. 04/30/2018 patient seen today for follow-up and management of bilateral lower extremity lymphedema. He did express being more sad today than usual due to the recent loss of his dog. He states that he has been compliant with using the lymphedema pumps. However he does admit a minute over the last 2-3 days he has not been using the pumps due to the recent loss of his dog. At this time there is no drainage or open wounds to his lower extremities. The left leg edema is measuring smaller today. Still has a significant amount of edema on bilateral lower extremities With dry flaky skin. He will be referred to the lymphedema clinic for further management. Will continue 3 layer compression wraps and follow-up in 1-2 weeks.Denies any pain, fever, chairs recently. No recent falls or injuries reported during this visit. 05/07/18 on evaluation today patient actually appears to be doing very well in regard to his lower extremities in general all things considered. With that being said he is having some pain in the legs just due to the amount of swelling. He does have an area where he had a blister on the left lateral lower extremity this is open  at this point other than that there's nothing else weeping at this time. 05/14/18 on evaluation today patient actually appears to be doing excellent all things considered in regard to his lower extremities. He still has a couple areas of weeping on each leg which has continued to be the issue for him. He does have an appointment with the lymphedema clinic although this isn't until February 2020. That was the earliest they had. In the meantime he has continued to tolerate the compression wraps without complication. 05/28/18 on evaluation today patient actually appears to be doing more poorly in regard to his left lower extremity where he has a wound open at this point. He also had a fall where he subsequently injured his right great toe which has led to an open wound at the site unfortunately. He has been tolerating the dressing changes without complication in general as far as the wraps are concerned that he has not been putting any dressing on the left 1st toe ulcer site. 06/11/18 on evaluation today patient appears to be doing much worse in regard to his bilateral lower extremity ulcers. He has been tolerating the dressing changes without  complication although his legs have not been wrapped more recently. Overall I am not very pleased with the way his legs appear. I do believe he needs to be back in compression wraps he still has not received his compression wraps from the [facility] hospital as of yet. 06/18/18 on evaluation today patient actually appears to be doing significantly better than last time I saw him. He has been tolerating the compression wraps without complication in the circumferential ulcers especially appear to be doing much better. His toe ulcer on the right in regard to the great toe is better although not as good as the legs in my pinion. No fevers chills noted 07/02/18 on evaluation today patient appears to be doing much better in regard to his lower extremity ulcers. Unfortunately since I  last saw him he's had the distal portion of his right great toe if he dated it sounds as if this actually went downhill very quickly. I had only seen him a few days prior and the toe did not appear to be infected at that point subsequently became infected very rapidly and it was decided by the surgeon that the distal portion of the toe needed to be removed. The patient seems to be doing well in this regard he tells me. With that being said his lower extremities are doing better from the standpoint of the wounds although he is significantly swollen at this point. 07/09/18 on evaluation today patient appears to be doing better in regard to the wounds on his lower extremities. In fact everything is almost completely healed he is just a small area on the left posterior lower extremity that is open at this point. 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 With that being said he needs some kind of compression it does sound like he may not be wearing his compression, that is the wraps, during the entire time between when he's here visit to visit. Apparently his wife took the current one off because it began to "fall apart". 07/16/18 on evaluation today patient appears to be extremely swollen especially in regard to his left lower extremity unfortunately. He also has a new skin tear over the left lower extremity and there's a smaller area on the right lower extremity as well. Unfortunately this seems to be due in part to blistering and fluid buildup in his leg. He did get the reduction wraps that were ordered by the [facility] for him to go to lymphedema clinic. With that being said his wounds on the legs have not healed to the point to where they would likely accept them as a patient lymphedema clinic currently. We need to try  to get this to heal. With that being said he's been taking his wraps off which is not doing him any favors at this point. In fact this is probably quite counterproductive compared to what needs to occur. We will likely need to increase to a four layer compression wrap and continue to also utilize elevation and he has to keep the wraps on not take them off as he's been doing currently hasn't had a wrap on since Saturday. 07/23/18 on evaluation today patient appears to be doing much better in regard to his bilateral lower extremities. In fact his left lower extremity which was the largest is actually 15 cm smaller today  compared to what it was last time he was here in our clinic. This is obviously good news after just one week. Nonetheless the differences he actually kept the wraps on during the entire week this time. That's not typical for him. I do believe he understands a little bit better now the severity of the situation and why it's important for him to keep these wraps on. 07/30/18 on evaluation today patient actually appears to be doing rather well in regard to his lower extremities. His legs are much smaller than they have been in the past and he actually has only one very small rudder superficial region remaining that is not closed on the left lateral/posterior lower extremity even this is almost completely close. I do believe likely next week he will be healed without any complications. I do think we need to continue the wraps however this seems to be beneficial for him. I also think it may be a good time for [place] to go ahead and see about getting the appointment with the lymphedema clinic which is supposed to be made for him in order to keep this moving along and hopefully get them into compression wraps that will in the end help him to remain healed. 08/06/18 on evaluation today patient actually appears to be doing very well in regard as bilateral lower extremities. In fact his wounds appear to  be completely healed at this time. 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 He is in the process of getting appointment with the lymphedema clinic we have made this referral were just waiting to hear back on the schedule time. We need to follow up on that today as well. 08/13/18 on evaluation today patient actually appears to be doing very well in regard to his bilateral lower extremities there are no open wounds at this point. We are gonna go ahead and see about ordering the Velcro compression wraps for him had a discussion with them about [place] doing it versus the [place] they feel like they can definitely afford going ahead and get the wraps themselves and they would prefer to try to avoid having to go to the lymphedema clinic if it all possible which I completely understand. As long as he has good compression I'm okay either way. 3/17//20 on evaluation today patient actually appears to be doing well in regard to his bilateral lower extremity ulcers. He has been tolerating the dressing changes without complication specifically the compression wraps. Overall is had no issues my fingers finding that I see at this point is that he is having trouble with constipation. He tells me he has not been able to go to the bathroom for about six days. He's taken to over-the- counter oral laxatives unfortunately this is not helping. He has not contacted his doctor. Adam Merritt, Adam Merritt ([id number]) 08/27/18 on evaluation today patient appears to be doing fairly well in regard to his lower extremities at this point. There does not appear to be any new altars is swelling is very well controlled. We are still waiting for his Velcro compression wraps to arrive that should be sometime in the next week is Artie sent the check for these. 09/03/18 on evaluation today patient appears to be doing excellent in regard to his bilateral lower extremities which he shows no signs of  wound openings in regard to this point. He does have his Velcro compression wraps which did arrive in the mail since I saw him last  week. Overall he is doing excellent in my pinion. 09/13/18 on evaluation today patient appears to be doing very well currently in regard to the overall appearance of his bilateral lower extremities although he's a little bit more swollen than last time we saw him. At that point he been discharged without any open wounds. Nonetheless he has a small open wound on the posterior left lower extremity with some evidence of cellulitis noted as well. Fortunately I feel like he has made good progress overall with regard to his lower extremities from were things used to be. 09/20/18 on evaluation today patient actually appears to be doing much better. The erythematous lower extremity is improving wound itself which is still open appears to be doing much better as far as but appearance as well as pain is concerned overall very pleased in this regard. There's no signs of active infection at this time. 09/27/18 on evaluation today patient's wounds on the lower extremity actually appear to be doing fairly well at this time which is good news. There is no evidence of active infection currently and again is just as left lower extremity were there any wounds at this point anyway. I believe they may be completely healed but again I'm not 100% sure based on evaluation today. I think one more week of observation would likely be a good idea. 10/04/18 on evaluation today patient actually appears to be doing excellent in regard to the left lower extremity which actually appears to be completely healed as of today. Unfortunately he's been having issues with his right lower extremity have a new wound that has opened. Fortunately there's no evidence of active infection at this time which is good news. 10/10/18 upon evaluation today patient actually appears to be doing a little bit worse with the new  open area on his right posterior lower extremity. He's been tolerating the dressing changes without complication. Right now we been using his compression wraps although I think we may need to switch back to actually performing bilateral compression wraps are in the clinic. No fevers, chills, nausea, or vomiting noted at this time. 10/17/18 on evaluation today patient actually appears to be doing quite well in regard to his bilateral lower extremity ulcers. He has been tolerating the reps without complication although he would prefer not to rewrap his legs as of today. Fortunately there's no signs of active infection which is good news. No fevers, chills, nausea, or vomiting noted at this time. 10/24/18 on evaluation today patient appears to be doing very well in regard to his lower extremities. His right lower extremity is shown signs of healing and his left lower [place] though not healed appears to be improving which is excellent news. Overall very pleased with how things seem to be progressing at this time. The patient is likewise happy to hear this. His Velcro compression wraps however have not been put on properly will gonna show his wife how to do that properly today. 10/31/18 on evaluation today patient appears to be doing more poorly in regard to his left lower extremity in particular although both lower extremities actually are showing some signs of being worse than my previous evaluation. Unfortunately I'm just not sure that his compression stockings even with the use of the compression/lymphedema pumps seem to be controlling this well. Upon further questioning he tells me that he also is not able to lie flat in the bed due to his congestive heart failure and difficulty breathing. For that reason he sleeps in  his recliner. To make matters worse his recliner also cannot even hold his legs up so instead of even being somewhat elevated they pretty much hang down to the floor. This is the way he  sleeps each night which is definitely counterproductive to everything else that were attempting to do from the standpoint of controlling his fluid. Nonetheless I think that he potentially could benefit from a hospital bed although this would be something that his primary care provider would likely have to order since anything that is order on our side has to be directly related to wound care and again the hospital bed is not necessarily a direct relation to although I think it does contribute to his overall wound status on his lower extremities. 11/07/18 on evaluation today patient appears to be doing better in regard to his bilateral lower extremity. He's been tolerating the dressing changes without complication. We did in the interim since I last saw him switch to just using extras orbiting new alginate and that seems to have done much better for him. I'm very pleased with the overall progress that is made. 11/14/18 on evaluation today patient appears to be redoing rather well in regard to his left lower extremity ulcers which are the only ones remaining at this point. Fortunately there's no signs of active infection at this time which is good news. Overall been very pleased with how things seem to be progressing currently. No fevers, chills, nausea, or vomiting noted at this time. 11/20/17 on evaluation today patient actually appears to be doing quite well in regard to his lower extremities on the left on the right he has several blisters that showed up although there's some question about whether or not he has had his broker compression wraps on like he was supposed to or not. Fortunately there's no signs of active infection at this time which is good news. Unfortunately though he is doing well from the standpoint of the left leg the right leg is not doing as well again this is when we did not wrap last week. 11/28/18 on evaluation today patient appears to be doing well in regard to his bilateral lower  extremities is left appears to be healed is right is not healed but is very close to being so. Overall very pleased with how things seem to be progressing. Patient is likewise happy that things are doing well. 12/09/18 on evaluation today patient actually appears to be completely healed which is excellent news. He actually seems to be doing well in regard to the swelling of the bilateral lower extremities which is also great news. Overall very pleased with how things seem to be progressing. Readmission: 03/18/2019 upon evaluation today patient presents for reevaluation concerning issues that he is having with his left lower extremity where he does have a wound noted upon inspection today. He also has a wound on the right second toe on the tip where it is apparently been rubbing in his shoe I did look at issues and you can actually see where this has been occurring as well. Fortunately there is no signs of infection with regard to the toe necessarily although it is very swollen compared to normal that does have me somewhat concerned about the possibility of further evaluation for infection/osteomyelitis. I would recommend an x-ray to start with. Otherwise he states that it is been 1-2 weeks that he has had the draining in regard to left lower extremity he does not know how this happened he has been wearing  his compression appropriately and I do see that as well today but nonetheless I do think that he needs to continue to be very cautious with regard to elevation as well. Adam Merritt, Adam Merritt ([id number]) 03/25/2019 on evaluation today patient appears to be doing much better in regard to his lower extremity at this time. That is on the left. He did culture positive for Pseudomonas but the good news is he seems to be doing much better the gentamicin we applied topically seems to have done a great job for him. There is no signs of active infection at this time systemically and locally he is doing much better.  No fevers, chills, nausea, vomiting, or diarrhea. In regard to his right toe this seems to be doing much better there is very little pressure noted at this point I did clean up some of the callus today around the edges of the wound as well as the surface of the wound but to be honest he is progressing quite nicely. 10/30; the area on the left anterior lower tibia area is healed over. His edema control is adequate even though his use of the compression pumps seems very intermittent. He has a juxta lite stocking on the right leg and a think there is 1 for the left leg at home. His wife is putting these on. He has an area on the plantar right second toe which is a hammertoe they are trying to offload this 04/08/2019 on evaluation today patient appears to be doing well in regard to his lower extremities which is good news. We are seeing him today for his toe ulcer which is giving him a little bit more trouble but again still seems to be doing better at this point which is good news. He is going require some sharp debridement in regard to the toe today however. 04/15/2019 on evaluation today patient actually appears to be doing quite well with regard to his toe ulcer. I am very pleased with how things are doing in that regard. With regard to his lower extremity edema in general he tells me he is not been using the lymphedema pumps regularly although he does not use them. I think that he needs to use them more regularly he does have a small blister on the left lower extremity this is not open at this point but obviously it something that can get worse if he does not keep the compression going and the pumps going as well. 04/22/2019 on evaluation today patient appears to be doing well with regard to his toe ulcer. He has been tolerating the dressing changes without complication. This is measuring slightly smaller this week as compared to last week. Fortunately there is no signs of active infection at this  time. 05/06/2019 on evaluation today patient actually appears to be doing excellent. In fact his toe appears to be completely healed which is great news. There is no signs of active infection at this time. Readmission: Patient presents today for follow-up after having been discharged at the beginning of December 2020. He states that in recent weeks things have reopened and has been having more trouble at this point. With that being said fortunately there is no signs of active infection at this time. No fever chills noted. Nonetheless he also does have an issue with one of his toes as well that seems to be somewhat this is on the right foot second toe. 07/25/2019 upon evaluation today patient's lower extremities appear to be doing excellent bilaterally. I  really feel like he is showing signs of great improvement and in fact I think he is close to healing which is great news. There is no signs of active infection at this time. No fevers, chills, nausea, vomiting, or diarrhea. 07/31/2019 upon evaluation today patient appears to be doing excellent and in fact I think may be completely healed in regard to his right lower extremity that were still to monitor this 1 more week before closing it out. The left lower extremity is measuring smaller although not completely closed seems to be doing excellent. 08/07/2019 upon evaluation today patient appears to be doing excellent in regard to his wounds. In fact the right lower extremity is completely healed there is no issues here. The left lower extremity has 1 very small area still remaining fortunately there is no signs of infection and overall I think he is very close to closure of the here as well. 08/14/19 upon evaluation today patient appears to be doing excellent in regard to his lower extremities. In fact he appears to be completely healed as of today. Fortunately there's no signs of active infection at this time. No fevers, chills, nausea, or vomiting noted  at this time. READMISSION 09/26/2019 This is a 75 year old man who is well-known to this clinic having just been discharged on 08/14/2019. He has known lymphedema and chronic venous insufficiency. He has Farrow wrap stockings and also external compression pumps. He does not use the external compression pumps however. He does however apparently fairly faithfully use the stockings. They have not been lotion in his legs. He has developed new wounds on the right anterior as well as the left medial left lateral and left posterior calf. He returns for our review of this Past medical history includes coronary artery disease, hypertension, congestive heart failure, COPD, venous insufficiency with lymphedema, type 2 diabetes. He apparently has compression pumps, Farrow wraps and at 1 point a hospital bed although I believe he sleeps in a recliner 10/03/2019 upon evaluation today patient appears to be doing better with regard to his wounds in general. He has been tolerating the dressing changes without complication. Fortunately there is no signs of active infection. No fevers, chills, nausea, vomiting, or diarrhea. I do believe the compression wraps are helping as they always have in the past. 10/10/2019 upon evaluation today patient appears to be doing excellent at this point. His right lower extremity is measuring much better and in fact is completely healed. His left lower extremity is also measuring better though not healed seems to be doing excellent at this point which is great news. Overall I am very pleased with how things appear currently. 10/27/2019 upon evaluation today patient appears to be doing quite well with regard to his wounds currently. He has been tolerating the dressing changes without complication. Fortunately there is no signs of active infection at this time. In fact he is almost completely healed on the left and has just a very small area open and on the right he is completely  healed. 11/04/2019 upon evaluation today patient appears to be doing quite well with regard to his wounds. In fact he appears to be quite possibly completely healed on the left lower extremity now as well the right lower extremity is still doing well. With that being said unfortunately though this may be healed I think it is a very fragile area and I cannot even confirm there is not a very small opening still remaining. I really think he may benefit from 1  additional weeks wrapping before discontinuing. 11/10/2019 upon evaluation today patient appears to be doing well in regard to his original wounds in fact everything that we were treating last week is completely healed on the left. Unfortunately he has a new skin tear just above where the wrap slipped down due to a fall he sustained causing this injury. 11/17/2019 on evaluation today patient appears to be doing better with regard to his wound. He has been tolerating the dressing changes without FALLOU, HULBERT. ([id number]) complication. Fortunately there is no signs of active infection at this time. No fevers, chills, nausea, vomiting, or diarrhea. 11/24/2019 upon evaluation today patient appears to be doing well with regard to his wound. This is making good progress there does not appear to be signs of active infection but overall I do feel like he is headed in the correct direction. Overall he is tolerating the compression wrap as well. 12/02/2019 upon evaluation today patient appears to be doing well with regard to his leg ulcer. In fact this appears to be completely healed and this is the last of the wounds that he had but still the left anterior lower extremity. Fortunately there is no signs of active infection at this time. No fevers, chills, nausea, vomiting, or diarrhea. Readmission: 02/05/2020 on evaluation today patient appears to be doing well with regard to his wound all things considered. He actually tells me that a couple weeks ago he had  trouble with his wraps and therefore took him off for a couple of days in order to give his legs a break. It was during this time that he actually developed increased swelling and edema of his legs and blisters on both legs. That is when he called to make the appointment. Nonetheless since that time he began wearing his compression wraps which are Velcro in the meantime and has done extremely well with this. In fact his leg appears to be under good control as far as edema is concerned today it is nothing like what I am seeing when he has come in for evaluations in the past. They have been using some of the silver alginate at home which has been beneficial for him. Fortunately there is no signs of active infection at this time. No fevers, chills, nausea, vomiting, or diarrhea. 02/19/2020 on evaluation today patient unfortunately does not appear to be doing quite as well as I would like to see. He has no signs of active infection at this time but unfortunately he is not doing well in regard to the overall appearance of his left leg. He has a couple other areas that are weeping now and the leg is much more swollen. I think that he needs to actually have a compression wrap to try to manage this. 02/26/2020 on evaluation today patient appears to be doing well with regard to his left lower extremity ulcer. Fortunately the swelling is down I do believe the pressure wrap seems to be doing quite well. Fortunately there is no signs of active infection at this time. 03/05/2020 upon evaluation today patient appears to be doing excellent in regard to his legs at this point. Fortunately there is no signs of active infection which is great news. Overall he feels like he is completely healed which is great news. Readmission: 05/20/2020 upon evaluation today patient appears to be doing well with regard to his leg ulcer all things considered. He is actually coming back to see [place] after having had a reopening of the ulcers  on  his left leg in the past several weeks. He is out of dressing supplies and his wife is been try to take care of this as best she can. 06/10/2020 upon evaluation today patient unfortunately appears to be doing significantly worse today compared to when I last saw him. He and his wife both tell me that "there has been a lot going on". I did not actually go into detail about everything that has been happening but nonetheless he has not obviously been wearing his compression. He brings them with him today but they were not on. I am not even certain to be honest that he could get those on at this point. Nonetheless I do believe that he is going to require compression wrapping at some point shortly but right now we need to try to get what appears to be infection under control at this time. 06/28/2020 upon evaluation today patient appears to be doing well with regard to 06/28/2020 upon evaluation today patient appears to be doing well pretty well in regard to his left leg which is much better than previous. With that being said in regard to his right leg he does seem to be having some issues with a new wound after he sustained a fall. This fortunately is not too deep but does appear to be something we need to address. Neither deep which is good. 07/05/2020 on evaluation today patient appears to be doing well with regard to his wounds. In fact everything on the left leg appears to be pretty much healed on the right leg this is very close though not completely closed yet. Fortunately there is no evidence of active infection at this time. No fevers, chills, nausea, vomiting, or diarrhea. 07/13/2019 upon evaluation today patient appears to be making good progress which is great news. Overall I am extremely pleased with where things stand today. There does not appear to be any signs of active infection which is great news and overall his left leg appears healed still the right leg though not healed does seem to be  making good progress which is excellent news. He is having no pain. 07/19/2020 on evaluation today patient appears to be doing well with regard to his wound. He has been tolerating the dressing changes without complication. Fortunately there does not appear to be any signs of active infection at this time. No fevers, chills, nausea, vomiting, or diarrhea. Readmission: 08/20/2020 on evaluation today patient presents for reevaluation here in the clinic concerning issues that he has been having with his bilateral lower extremities. Unfortunately this is an ongoing reoccurring issue. He tells me that he is really not certain exactly what caused the issue although as I discussed this further with him it appears that he has been sitting for the most part and sleeping in his chair with his feet on the ground he is not really elevating at all in fact the chair does not even have a reclining function therefore it is basically just him sitting with his feet on the ground. I think this alone has probably led to his increased swelling he is also having more difficulty walking which means that he is pumping less blood flow through his legs anyway as far as the venous stasis is concerned. All in all I think this is leading to a downward spiral of him not being able to walk very well as his legs are so heavy, they begin to weep, and overall he just does not seem to be doing as well  as it was when I last saw him. Electronic Signature(s) Signed: 08/20/2020 3:31:45 PM By: Worthy Keeler PA-C Entered By: Worthy Keeler on 08/20/2020 15:31:45 Adam Merritt ([id number]) -------------------------------------------------------------------------------- Physical Exam Details Patient Name: Adam Merritt Date of Service: 08/20/2020 9:30 AM Medical Record Number: [id number] Patient Account Number: [id number] Date of Birth/Sex: 10/20/1945 (75 y.o. M) Treating RN: Carlene Coria Primary Care Provider: [name] Other  Clinician: Jeanine Luz Referring Provider: [name] Treating Provider/Extender: Jeri Cos [name]: 0 Constitutional patient is hypertensive.. pulse regular and within target range for patient.Marland Kitchen respirations regular, non-labored and within target range for patient.Marland Kitchen temperature within target range for patient.. Well-nourished and well-hydrated in no acute distress. Eyes conjunctiva clear no eyelid edema noted. pupils equal round and reactive to light and accommodation. Ears, Nose, Mouth, and Throat no gross abnormality of ear auricles or external auditory canals. normal hearing noted during conversation. mucus membranes moist. Respiratory normal breathing without difficulty. Cardiovascular 2+ dorsalis pedis/posterior tibialis pulses. 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 Musculoskeletal Patient unable to walk without assistance. Psychiatric this patient is able to make decisions and demonstrates good insight into disease process. Alert and Oriented x 3. pleasant and cooperative. Notes Upon inspection patient's wounds again appear to just be weeping areas over his legs there is no specific wounds noted at this point that are going require a specific type of dressing. Nonetheless I do think that he really does need intervention here as far as trying to get the edema under control but also believe he needs some lifestyle changes at home in order to help things as well that would be detailed below. Electronic Signature(s) Signed: 08/20/2020 3:32:23 PM By: Worthy Keeler PA-C Entered By: Worthy Keeler on 08/20/2020 15:32:22 Adam Merritt ([id number]) -------------------------------------------------------------------------------- Physician Orders Details Patient Name: Adam Merritt Date of Service: 08/20/2020 9:30 AM Medical Record Number: [id number] Patient Account Number: [id number] Date of Birth/Sex: 29-Jul-1945 (75 y.o. M) Treating RN:  Carlene Coria Primary Care Provider: [name] Other Clinician: Jeanine Luz Referring Provider: [name] Treating Provider/Extender: Skipper Cliche in Treatment: 0 Verbal / Phone Orders: No Diagnosis Coding Follow-up Appointments o Return Appointment in 1 week. Bathing/ Shower/ Hygiene o May shower with wound dressing protected with water repellent cover or cast protector. Edema Control - Lymphedema / Segmental Compressive Device / Other o 4 Layer Compression System (Left, Right, Bilateral) Lymphedema. - bilat o Elevate, Exercise Daily and Avoid Standing for Long Periods of Time. o Elevate legs to the level of the heart and pump ankles as often as possible o Elevate leg(s) parallel to the floor when sitting. Electronic Signature(s) Signed: 08/20/2020 4:40:31 PM By: Worthy Keeler PA-C Signed: 09/10/2020 8:11:40 AM By: Carlene Coria RN Entered By: Carlene Coria on 08/20/2020 10:51:55 Adam Merritt ([id number]) -------------------------------------------------------------------------------- Problem List Details Patient Name: Adam Merritt Date of Service: 08/20/2020 9:30 AM Medical Record Number: [id number] Patient Account Number: [id number] Date of Birth/Sex: [birth year] (75 y.o. M) Treating RN: Carlene Coria Primary Care Provider: [name] Other Clinician: Jeanine Luz Referring Provider: [name] Treating Provider/Extender: Jeri Cos [name]: 0 Active Problems ICD-10 Encounter Code Description Active Date MDM Diagnosis I89.0 Lymphedema, not elsewhere classified 08/20/2020 No Yes I87.2 Venous insufficiency (chronic) (peripheral) 08/20/2020 No Yes Inactive Problems Resolved Problems Electronic Signature(s) Signed: 08/20/2020 10:56:21 AM By: Worthy Keeler PA-C Entered By: Worthy Keeler on 08/20/2020 10:56:21 Adam Merritt ([id number]) -------------------------------------------------------------------------------- Progress Note  Details Patient  Name: ORELL, HURTADO Date of Service: 08/20/2020 9:30 AM Medical Record Number: [id number] Patient Account Number: [id number] Date of Birth/Sex: [birth year] (75 y.o. M) Treating RN: Carlene Coria Primary Care Provider: [name] Other Clinician: Jeanine Luz Referring Provider: [name] Treating Provider/Extender: Skipper Cliche in Treatment: 0 Subjective Chief Complaint Information obtained from Patient Bilateral LE Ulcers History of Present Illness (HPI) 10/18/17-He is here for initial evaluation of bilateral lower extremity ulcerations in the presence of venous insufficiency and lymphedema. He has been seen by vascular medicine in the past, Dr. Lucky Cowboy, last seen in 2016. He does have a history of abnormal ABIs, which is to be expected given his lymphedema and venous insufficiency. According to Epic, it appears that all attempts for arterial evaluation and/or angiography were not follow through with by patient. He does have a history of being seen in lymphedema clinic in 2018, stopped going approximately 6 months ago stating "it didn't do any good". He does not have lymphedema pumps, he does not have custom fit compression wrap/stockings. He is diabetic and his recent A1c last month was 7.6. He admits to chronic bilateral lower extremity pain, no change in pain since blister and ulceration development. He is currently being treated with Levaquin for bronchitis. He has home health and we will continue. 10/25/17-He is here in follow-up evaluation for bilateral lower extremity ulcerationssubtle he remains on Levaquin for bronchitis. Right lower extremity with no evidence of drainage or ulceration, persistent left lower extremity ulceration. He states that home health has not been out since his appointment. He went to [facility] on Tuesday, studies revealed: RIGHT ABI 0.9, TBI 0.6 LEFT ABI 1.1, TBI 0.6 with triphasic flow bilaterally. We will continue his  same treatment plan. He has been educated on compression therapy and need for elevation. He will benefit from lymphedema pumps 11/01/17-He is here in follow-up evaluation for left lower extremity ulcer. The right lower extremity remains healed. He has home health services, but they have not been out to see the patient for 2-3 weeks. He states it home health physical therapy changed his dressing yesterday after therapy; he placed Ace wrap compression. We are still waiting for lymphedema pumps, reordered d/t need for company change. 11/08/17-He is here in follow-up evaluation for left lower extremity ulcer. It is improved. Edema is significantly improved with compression therapy. We will continue with same treatment plan and he will follow-up next week. No word regarding lymphedema pumps 11/15/17-He is here in follow-up evaluation for left lower extremity ulcer. He is healed and will be discharged from wound care services. I have reached out to [facility] regarding his lymphedema pumps. They have been unable to reach the patient; the contact number they had with the patient's wife's cell phone and she has not answered any unrecognized calls. Contact should be made today, trial planned for next week; [facility] will continue to follow 11/27/17 on evaluation today patient has multiple blistered areas over the right lower extremity his left lower extremity appears to be doing okay. These blistered areas show signs of no infection which is great news. With that being said he did have some necrotic skin overlying which was mechanically debrided away with saline and gauze today without complication. Overall post debridement the wounds appear to be doing better but in general his swelling seems to be increased. This is obviously not good news. I think this is what has given rise to the blisters. 12/04/17 on evaluation today patient presents for follow-up  concerning his bilateral lower extremity edema in  the right lower extremity ulcers. He has been tolerating the dressing changes without complication. With that being said he has had no real issues with the wraps which is also good news. Overall I'm pleased with the progress he's been making. 12/11/17 on evaluation today patient appears to be doing rather well in regard to his right lateral lower extremity ulcer. He's been tolerating the dressing changes without complication. Fortunately there does not appear to be any evidence of infection at this time. Overall I'm pleased with the progress that is being made. Unfortunately he has been in the hospital due to having what sounds to be a stomach virus/flu fortunately that is starting to get better. 12/18/17 on evaluation today patient actually appears to be doing very well in regard to his bilateral lower extremities the swelling is under fairly good control his lymphedema pumps are still not up and running quite yet. With that being said he does have several areas of opening noted as far as wounds are concerned mainly over the left lower extremity. With that being said I do believe once he gets lymphedema pumps this would least help you mention some the fluid and preventing this from occurring. Hopefully that will be set up soon sleeves are Artie in place at his home he just waiting for the machine. 12/25/17 on evaluation today patient actually appears to be doing excellent in fact all of his ulcers appear to have resolved his legs appear very well. I do think he needs compression stockings we have discussed this and they are actually going to go to [facility] today to elastic therapy to get this fitted for him. I think that is definitely a good thing to do. Readmission: 04/09/18 upon evaluation today this patient is seen for readmission due to bilateral lower extremity lymphedema. He has significant swelling of his extremities especially on the left although the right is also swollen he has weeping from  both sides. There are no obvious open wounds at this point. Fortunately he has been doing fairly well for quite a bit of time since I last saw him. Nonetheless unfortunately this seems to have reopened and is giving quite a bit of trouble. He states this began about a week ago when he first called [place] to get in to be seen. No fevers, chills, nausea, or vomiting noted at this time. He has not been using his lymphedema pumps due to the fact that they won't fit on his leg at this point likewise is also not been using his compression for essentially the same reason. 04/16/18 upon evaluation today patient actually appears to be doing a little better in regard to the fluid in his bilateral lower extremities. With that being said he's had three falls since I saw him last week. He also states that he's been feeling very poorly. I was concerned last week and feel like that the concern is still there as far as the congestion in his chest is concerned he seems to be breathing about the same as last week but again he states he's very weak he's not even able to walk further than from the chair to the door. His wife had to buy a wheelchair just to be able to get them out of the house to get to the appointment today. This has me very concerned. Adam Merritt, Adam Merritt ([id number]) 04/23/18 on evaluation today patient actually appears to be doing much better than last week's evaluation. At that time  actually had to transport him to the ER via EMS and he subsequently was admitted for acute pulmonary edema, acute renal failure, and acute congestive heart failure. Fortunately he is doing much better. Apparently they did dialyze him and were able to take off roughly 35 pounds of fluid. Nonetheless he is feeling much better both in regard to his breathing and he's able to get around much better at this time compared to previous. Overall I'm very happy with how things are at this time. There does not appear to be any evidence of  infection currently. No fevers, chills, nausea, or vomiting noted at this time. 04/30/2018 patient seen today for follow-up and management of bilateral lower extremity lymphedema. He did express being more sad today than usual due to the recent loss of his dog. He states that he has been compliant with using the lymphedema pumps. However he does admit a minute over the last 2-3 days he has not been using the pumps due to the recent loss of his dog. At this time there is no drainage or open wounds to his lower extremities. The left leg edema is measuring smaller today. Still has a significant amount of edema on bilateral lower extremities With dry flaky skin. He will be referred to the lymphedema clinic for further management. Will continue 3 layer compression wraps and follow-up in 1-2 weeks.Denies any pain, fever, chairs recently. No recent falls or injuries reported during this visit. 05/07/18 on evaluation today patient actually appears to be doing very well in regard to his lower extremities in general all things considered. With that being said he is having some pain in the legs just due to the amount of swelling. He does have an area where he had a blister on the left lateral lower extremity this is open at this point other than that there's nothing else weeping at this time. 05/14/18 on evaluation today patient actually appears to be doing excellent all things considered in regard to his lower extremities. He still has a couple areas of weeping on each leg which has continued to be the issue for him. He does have an appointment with the lymphedema clinic although this isn't until February 2020. That was the earliest they had. In the meantime he has continued to tolerate the compression wraps without complication. 05/28/18 on evaluation today patient actually appears to be doing more poorly in regard to his left lower extremity where he has a wound open at this point. He also had a fall where he  subsequently injured his right great toe which has led to an open wound at the site unfortunately. He has been tolerating the dressing changes without complication in general as far as the wraps are concerned that he has not been putting any dressing on the left 1st toe ulcer site. 06/11/18 on evaluation today patient appears to be doing much worse in regard to his bilateral lower extremity ulcers. He has been tolerating the dressing changes without complication although his legs have not been wrapped more recently. Overall I am not very pleased with the way his legs appear. I do believe he needs to be back in compression wraps he still has not received his compression wraps from the [facility] hospital as of yet. 06/18/18 on evaluation today patient actually appears to be doing significantly better than last time I saw him. He has been tolerating the compression wraps without complication in the circumferential ulcers especially appear to be doing much better. His toe ulcer on  the right in regard to the great toe is better although not as good as the legs in my pinion. No fevers chills noted 07/02/18 on evaluation today patient appears to be doing much better in regard to his lower extremity ulcers. Unfortunately since I last saw him he's had the distal portion of his right great toe if he dated it sounds as if this actually went downhill very quickly. I had only seen him a few days prior and the toe did not appear to be infected at that point subsequently became infected very rapidly and it was decided by the surgeon that the distal portion of the toe needed to be removed. The patient seems to be doing well in this regard he tells me. With that being said his lower extremities are doing better from the standpoint of the wounds although he is significantly swollen at this point. 07/09/18 on evaluation today patient appears to be doing better in regard to the wounds on his lower extremities. In fact everything is  almost completely healed he is just a small area on the left posterior lower extremity that is open at this point. 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 With that being said he needs some kind of compression it does sound like he may not be wearing his compression, that is the wraps, during the entire time between when he's here visit to visit. Apparently his wife took the current one off because it began to "fall apart". 07/16/18 on evaluation today patient appears to be extremely swollen especially in regard to his left lower extremity unfortunately. He also has a new skin tear over the left lower extremity and there's a smaller area on the right lower extremity as well. Unfortunately this seems to be due in part to blistering and fluid buildup in his leg. He did get the reduction wraps that were ordered by the [facility] hospital for him to go to lymphedema clinic. With that being said his wounds on the legs have not healed to the point to where they would likely accept them as a patient lymphedema clinic currently. We need to try to get this to heal. With that being said he's been taking his wraps off which is not doing him any favors at this point. In fact this is probably quite counterproductive compared to what needs to occur. We will likely need to increase to a four layer compression wrap and continue to also utilize elevation and he has to keep the wraps on not take them off as he's been doing currently hasn't had a wrap on since Saturday. 07/23/18 on evaluation today patient appears to be doing much better in regard to his bilateral lower extremities. In fact his left lower extremity which was the largest is actually 15 cm smaller today compared to what it was last time he was here in our clinic. This is obviously  good news after just one week. Nonetheless the differences he actually kept the wraps on during the entire week this time. That's not typical for him. I do believe he understands a little bit better now the severity of the situation and why it's important for him to keep these wraps on. 07/30/18 on evaluation today patient actually appears to be doing rather well in regard to his lower extremities. His legs are much  smaller than they have been in the past and he actually has only one very small rudder superficial region remaining that is not closed on the left lateral/posterior lower extremity even this is almost completely close. I do believe likely next week he will be healed without any complications. I do think we need to continue the wraps however this seems to be beneficial for him. I also think it may be a good time for [place] to go ahead and see about getting the appointment with the lymphedema clinic which is supposed to be made for him in order to keep this moving along and hopefully get them into compression wraps that will in the end help him to remain healed. 08/06/18 on evaluation today patient actually appears to be doing very well in regard as bilateral lower extremities. In fact his wounds appear to be completely healed at this time. 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 He is in the process of getting appointment with the lymphedema clinic we have made this referral were just waiting to hear back on the schedule time. We need to follow up on that today as well. 08/13/18 on evaluation today patient actually appears to be doing very well in regard to his bilateral lower extremities there are no open wounds at this point. We are gonna go ahead and see about ordering the Velcro compression wraps for him had a discussion with them about [place] doing it versus the [place] they feel like they can definitely afford going ahead and get the wraps  themselves and they would prefer to try to avoid having to go to the lymphedema clinic if it all possible which I completely understand. As long as he has good compression I'm okay either way. Adam Merritt, Adam Merritt ([id number]) 3/17//20 on evaluation today patient actually appears to be doing well in regard to his bilateral lower extremity ulcers. He has been tolerating the dressing changes without complication specifically the compression wraps. Overall is had no issues my fingers finding that I see at this point is that he is having trouble with constipation. He tells me he has not been able to go to the bathroom for about six days. He's taken to over-the- counter oral laxatives unfortunately this is not helping. He has not contacted his doctor. 08/27/18 on evaluation today patient appears to be doing fairly well in regard to his lower extremities at this point. There does not appear to be any new altars is swelling is very well controlled. We are still waiting for his Velcro compression wraps to arrive that should be sometime in the next week is Artie sent the check for these. 09/03/18 on evaluation today patient appears to be doing excellent in regard to his bilateral lower extremities which he shows no signs of wound openings in regard to this point. He does have his Velcro compression wraps which did arrive in the mail since I saw him last week. Overall he is doing excellent in my pinion. 09/13/18 on evaluation today patient appears to be doing very well currently in regard to the overall appearance of his bilateral lower extremities although he's a little bit more swollen than last time we saw him. At that point he been discharged without any open wounds. Nonetheless he has a small open wound on the posterior left lower extremity with some evidence of cellulitis noted as well. Fortunately I feel like he has made good progress overall with regard to his lower  extremities from were things used to  be. 09/20/18 on evaluation today patient actually appears to be doing much better. The erythematous lower extremity is improving wound itself which is still open appears to be doing much better as far as but appearance as well as pain is concerned overall very pleased in this regard. There's no signs of active infection at this time. 09/27/18 on evaluation today patient's wounds on the lower extremity actually appear to be doing fairly well at this time which is good news. There is no evidence of active infection currently and again is just as left lower extremity were there any wounds at this point anyway. I believe they may be completely healed but again I'm not 100% sure based on evaluation today. I think one more week of observation would likely be a good idea. 10/04/18 on evaluation today patient actually appears to be doing excellent in regard to the left lower extremity which actually appears to be completely healed as of today. Unfortunately he's been having issues with his right lower extremity have a new wound that has opened. Fortunately there's no evidence of active infection at this time which is good news. 10/10/18 upon evaluation today patient actually appears to be doing a little bit worse with the new open area on his right posterior lower extremity. He's been tolerating the dressing changes without complication. Right now we been using his compression wraps although I think we may need to switch back to actually performing bilateral compression wraps are in the clinic. No fevers, chills, nausea, or vomiting noted at this time. 10/17/18 on evaluation today patient actually appears to be doing quite well in regard to his bilateral lower extremity ulcers. He has been tolerating the reps without complication although he would prefer not to rewrap his legs as of today. Fortunately there's no signs of active infection which is good news. No fevers, chills, nausea, or vomiting noted at this  time. 10/24/18 on evaluation today patient appears to be doing very well in regard to his lower extremities. His right lower extremity is shown signs of healing and his left lower Trinity though not healed appears to be improving which is excellent news. Overall very pleased with how things seem to be progressing at this time. The patient is likewise happy to hear this. His Velcro compression wraps however have not been put on properly will gonna show his wife how to do that properly today. 10/31/18 on evaluation today patient appears to be doing more poorly in regard to his left lower extremity in particular although both lower extremities actually are showing some signs of being worse than my previous evaluation. Unfortunately I'm just not sure that his compression stockings even with the use of the compression/lymphedema pumps seem to be controlling this well. Upon further questioning he tells me that he also is not able to lie flat in the bed due to his congestive heart failure and difficulty breathing. For that reason he sleeps in his recliner. To make matters worse his recliner also cannot even hold his legs up so instead of even being somewhat elevated they pretty much hang down to the floor. This is the way he sleeps each night which is definitely counterproductive to everything else that were attempting to do from the standpoint of controlling his fluid. Nonetheless I think that he potentially could benefit from a hospital bed although this would be something that his primary care provider would likely have to order since anything that is order on  our side has to be directly related to wound care and again the hospital bed is not necessarily a direct relation to although I think it does contribute to his overall wound status on his lower extremities. 11/07/18 on evaluation today patient appears to be doing better in regard to his bilateral lower extremity. He's been tolerating the dressing  changes without complication. We did in the interim since I last saw him switch to just using extras orbiting new alginate and that seems to have done much better for him. I'm very pleased with the overall progress that is made. 11/14/18 on evaluation today patient appears to be redoing rather well in regard to his left lower extremity ulcers which are the only ones remaining at this point. Fortunately there's no signs of active infection at this time which is good news. Overall been very pleased with how things seem to be progressing currently. No fevers, chills, nausea, or vomiting noted at this time. 11/20/17 on evaluation today patient actually appears to be doing quite well in regard to his lower extremities on the left on the right he has several blisters that showed up although there's some question about whether or not he has had his broker compression wraps on like he was supposed to or not. Fortunately there's no signs of active infection at this time which is good news. Unfortunately though he is doing well from the standpoint of the left leg the right leg is not doing as well again this is when we did not wrap last week. 11/28/18 on evaluation today patient appears to be doing well in regard to his bilateral lower extremities is left appears to be healed is right is not healed but is very close to being so. Overall very pleased with how things seem to be progressing. Patient is likewise happy that things are doing well. 12/09/18 on evaluation today patient actually appears to be completely healed which is excellent news. He actually seems to be doing well in regard to the swelling of the bilateral lower extremities which is also great news. Overall very pleased with how things seem to be progressing. Readmission: 03/18/2019 upon evaluation today patient presents for reevaluation concerning issues that he is having with his left lower extremity where he does have a wound noted upon inspection  today. He also has a wound on the right second toe on the tip where it is apparently been rubbing in his shoe I did look at issues and you can actually see where this has been occurring as well. Fortunately there is no signs of infection with regard to Adam Merritt, Adam Merritt. ([id number]) the toe necessarily although it is very swollen compared to normal that does have me somewhat concerned about the possibility of further evaluation for infection/osteomyelitis. I would recommend an x-ray to start with. Otherwise he states that it is been 1-2 weeks that he has had the draining in regard to left lower extremity he does not know how this happened he has been wearing his compression appropriately and I do see that as well today but nonetheless I do think that he needs to continue to be very cautious with regard to elevation as well. 03/25/2019 on evaluation today patient appears to be doing much better in regard to his lower extremity at this time. That is on the left. He did culture positive for Pseudomonas but the good news is he seems to be doing much better the gentamicin we applied topically seems to have done a great job  for him. There is no signs of active infection at this time systemically and locally he is doing much better. No fevers, chills, nausea, vomiting, or diarrhea. In regard to his right toe this seems to be doing much better there is very little pressure noted at this point I did clean up some of the callus today around the edges of the wound as well as the surface of the wound but to be honest he is progressing quite nicely. 10/30; the area on the left anterior lower tibia area is healed over. His edema control is adequate even though his use of the compression pumps seems very intermittent. He has a juxta lite stocking on the right leg and a think there is 1 for the left leg at home. His wife is putting these on. He has an area on the plantar right second toe which is a hammertoe they are  trying to offload this 04/08/2019 on evaluation today patient appears to be doing well in regard to his lower extremities which is good news. We are seeing him today for his toe ulcer which is giving him a little bit more trouble but again still seems to be doing better at this point which is good news. He is going require some sharp debridement in regard to the toe today however. 04/15/2019 on evaluation today patient actually appears to be doing quite well with regard to his toe ulcer. I am very pleased with how things are doing in that regard. With regard to his lower extremity edema in general he tells me he is not been using the lymphedema pumps regularly although he does not use them. I think that he needs to use them more regularly he does have a small blister on the left lower extremity this is not open at this point but obviously it something that can get worse if he does not keep the compression going and the pumps going as well. 04/22/2019 on evaluation today patient appears to be doing well with regard to his toe ulcer. He has been tolerating the dressing changes without complication. This is measuring slightly smaller this week as compared to last week. Fortunately there is no signs of active infection at this time. 05/06/2019 on evaluation today patient actually appears to be doing excellent. In fact his toe appears to be completely healed which is great news. There is no signs of active infection at this time. Readmission: Patient presents today for follow-up after having been discharged at the beginning of December 2020. He states that in recent weeks things have reopened and has been having more trouble at this point. With that being said fortunately there is no signs of active infection at this time. No fever chills noted. Nonetheless he also does have an issue with one of his toes as well that seems to be somewhat this is on the right foot second toe. 07/25/2019 upon evaluation today  patient's lower extremities appear to be doing excellent bilaterally. I really feel like he is showing signs of great improvement and in fact I think he is close to healing which is great news. There is no signs of active infection at this time. No fevers, chills, nausea, vomiting, or diarrhea. 07/31/2019 upon evaluation today patient appears to be doing excellent and in fact I think may be completely healed in regard to his right lower extremity that were still to monitor this 1 more week before closing it out. The left lower extremity is measuring smaller although not completely  closed seems to be doing excellent. 08/07/2019 upon evaluation today patient appears to be doing excellent in regard to his wounds. In fact the right lower extremity is completely healed there is no issues here. The left lower extremity has 1 very small area still remaining fortunately there is no signs of infection and overall I think he is very close to closure of the here as well. 08/14/19 upon evaluation today patient appears to be doing excellent in regard to his lower extremities. In fact he appears to be completely healed as of today. Fortunately there's no signs of active infection at this time. No fevers, chills, nausea, or vomiting noted at this time. READMISSION 09/26/2019 This is a 75 year old man who is well-known to this clinic having just been discharged on 08/14/2019. He has known lymphedema and chronic venous insufficiency. He has [place] wrap stockings and also external compression pumps. He does not use the external compression pumps however. He does however apparently fairly faithfully use the stockings. They have not been lotion in his legs. He has developed new wounds on the right anterior as well as the left medial left lateral and left posterior calf. He returns for our review of this Past medical history includes coronary artery disease, hypertension, congestive heart failure, COPD, venous insufficiency  with lymphedema, type 2 diabetes. He apparently has compression pumps, F[place] wraps and at 1 point a hospital bed although I believe he sleeps in a recliner 10/03/2019 upon evaluation today patient appears to be doing better with regard to his wounds in general. He has been tolerating the dressing changes without complication. Fortunately there is no signs of active infection. No fevers, chills, nausea, vomiting, or diarrhea. I do believe the compression wraps are helping as they always have in the past. 10/10/2019 upon evaluation today patient appears to be doing excellent at this point. His right lower extremity is measuring much better and in fact is completely healed. His left lower extremity is also measuring better though not healed seems to be doing excellent at this point which is great news. Overall I am very pleased with how things appear currently. 10/27/2019 upon evaluation today patient appears to be doing quite well with regard to his wounds currently. He has been tolerating the dressing changes without complication. Fortunately there is no signs of active infection at this time. In fact he is almost completely healed on the left and has just a very small area open and on the right he is completely healed. 11/04/2019 upon evaluation today patient appears to be doing quite well with regard to his wounds. In fact he appears to be quite possibly completely healed on the left lower extremity now as well the right lower extremity is still doing well. With that being said unfortunately though this may be healed I think it is a very fragile area and I cannot even confirm there is not a very small opening still remaining. I really think he may benefit from 1 additional weeks wrapping before discontinuing. Adam Merritt, Adam Merritt ([id number]) 11/10/2019 upon evaluation today patient appears to be doing well in regard to his original wounds in fact everything that we were treating last week is completely healed  on the left. Unfortunately he has a new skin tear just above where the wrap slipped down due to a fall he sustained causing this injury. 11/17/2019 on evaluation today patient appears to be doing better with regard to his wound. He has been tolerating the dressing changes without complication. Fortunately there is  no signs of active infection at this time. No fevers, chills, nausea, vomiting, or diarrhea. 11/24/2019 upon evaluation today patient appears to be doing well with regard to his wound. This is making good progress there does not appear to be signs of active infection but overall I do feel like he is headed in the correct direction. Overall he is tolerating the compression wrap as well. 12/02/2019 upon evaluation today patient appears to be doing well with regard to his leg ulcer. In fact this appears to be completely healed and this is the last of the wounds that he had but still the left anterior lower extremity. Fortunately there is no signs of active infection at this time. No fevers, chills, nausea, vomiting, or diarrhea. Readmission: 02/05/2020 on evaluation today patient appears to be doing well with regard to his wound all things considered. He actually tells me that a couple weeks ago he had trouble with his wraps and therefore took him off for a couple of days in order to give his legs a break. It was during this time that he actually developed increased swelling and edema of his legs and blisters on both legs. That is when he called to make the appointment. Nonetheless since that time he began wearing his compression wraps which are Velcro in the meantime and has done extremely well with this. In fact his leg appears to be under good control as far as edema is concerned today it is nothing like what I am seeing when he has come in for evaluations in the past. They have been using some of the silver alginate at home which has been beneficial for him. Fortunately there is no signs of  active infection at this time. No fevers, chills, nausea, vomiting, or diarrhea. 02/19/2020 on evaluation today patient unfortunately does not appear to be doing quite as well as I would like to see. He has no signs of active infection at this time but unfortunately he is not doing well in regard to the overall appearance of his left leg. He has a couple other areas that are weeping now and the leg is much more swollen. I think that he needs to actually have a compression wrap to try to manage this. 02/26/2020 on evaluation today patient appears to be doing well with regard to his left lower extremity ulcer. Fortunately the swelling is down I do believe the pressure wrap seems to be doing quite well. Fortunately there is no signs of active infection at this time. 03/05/2020 upon evaluation today patient appears to be doing excellent in regard to his legs at this point. Fortunately there is no signs of active infection which is great news. Overall he feels like he is completely healed which is great news. Readmission: 05/20/2020 upon evaluation today patient appears to be doing well with regard to his leg ulcer all things considered. He is actually coming back to see [place] after having had a reopening of the ulcers on his left leg in the past several weeks. He is out of dressing supplies and his wife is been try to take care of this as best she can. 06/10/2020 upon evaluation today patient unfortunately appears to be doing significantly worse today compared to when I last saw him. He and his wife both tell me that "there has been a lot going on". I did not actually go into detail about everything that has been happening but nonetheless he has not obviously been wearing his compression. He brings them with  him today but they were not on. I am not even certain to be honest that he could get those on at this point. Nonetheless I do believe that he is going to require compression wrapping at some point shortly but  right now we need to try to get what appears to be infection under control at this time. 06/28/2020 upon evaluation today patient appears to be doing well with regard to 06/28/2020 upon evaluation today patient appears to be doing well pretty well in regard to his left leg which is much better than previous. With that being said in regard to his right leg he does seem to be having some issues with a new wound after he sustained a fall. This fortunately is not too deep but does appear to be something we need to address. Neither deep which is good. 07/05/2020 on evaluation today patient appears to be doing well with regard to his wounds. In fact everything on the left leg appears to be pretty much healed on the right leg this is very close though not completely closed yet. Fortunately there is no evidence of active infection at this time. No fevers, chills, nausea, vomiting, or diarrhea. 07/13/2019 upon evaluation today patient appears to be making good progress which is great news. Overall I am extremely pleased with where things stand today. There does not appear to be any signs of active infection which is great news and overall his left leg appears healed still the right leg though not healed does seem to be making good progress which is excellent news. He is having no pain. 07/19/2020 on evaluation today patient appears to be doing well with regard to his wound. He has been tolerating the dressing changes without complication. Fortunately there does not appear to be any signs of active infection at this time. No fevers, chills, nausea, vomiting, or diarrhea. Readmission: 08/20/2020 on evaluation today patient presents for reevaluation here in the clinic concerning issues that he has been having with his bilateral lower extremities. Unfortunately this is an ongoing reoccurring issue. He tells me that he is really not certain exactly what caused the issue although as I discussed this further with him it  appears that he has been sitting for the most part and sleeping in his chair with his feet on the ground he is not really elevating at all in fact the chair does not even have a reclining function therefore it is basically just him sitting with his feet on the ground. I think this alone has probably led to his increased swelling he is also having more difficulty walking which means that he is pumping less blood flow through his legs anyway as far as the venous stasis is concerned. All in all I think this is leading to a downward spiral of him not being able to walk very well as his legs are so heavy, they begin to weep, and overall he just does not seem to be doing as well as it was when I last saw him. Patient History Information obtained from Patient. Allergies Corticosteroids (Glucocorticoids) TAHJAI, SCHETTER ([id number]) Family History Cancer - Paternal Grandparents, Kidney Disease - Siblings, Lung Disease - Mother, No family history of Diabetes, Heart Disease, Hereditary Spherocytosis, Hypertension, Seizures, Stroke, Thyroid Problems, Tuberculosis. Social History Never smoker, Marital Status - Married, Alcohol Use - Never, Drug Use - No History, Caffeine Use - Never. Medical History Eyes Denies history of Cataracts, Glaucoma, Optic Neuritis Ear/Nose/Mouth/Throat Denies history of Chronic sinus problems/congestion, Middle ear  problems Hematologic/Lymphatic Patient has history of Anemia - aplastic anemia, Lymphedema Denies history of Hemophilia, Human Immunodeficiency Virus, Sickle Cell Disease Respiratory Patient has history of Chronic Obstructive Pulmonary Disease (COPD), Sleep Apnea Denies history of Aspiration, Asthma, Pneumothorax, Tuberculosis Cardiovascular Patient has history of Congestive Heart Failure, Coronary Artery Disease, Hypertension, Peripheral Venous Disease Denies history of Angina, Arrhythmia, Deep Vein Thrombosis, Hypotension, Myocardial Infarction, Peripheral  Arterial Disease, Phlebitis, Vasculitis Gastrointestinal Denies history of Cirrhosis , Colitis, Crohn s, Hepatitis A, Hepatitis B, Hepatitis C Endocrine Patient has history of Type II Diabetes Denies history of Type I Diabetes Genitourinary Denies history of End Stage Renal Disease Immunological Denies history of Lupus Erythematosus, Raynaud s, Scleroderma Integumentary (Skin) Denies history of History of Burn, History of pressure wounds Musculoskeletal Patient has history of Osteoarthritis Denies history of Gout, Rheumatoid Arthritis, Osteomyelitis Neurologic Patient has history of Neuropathy Denies history of Dementia, Quadriplegia, Paraplegia, Seizure Disorder Oncologic Patient has history of Received Chemotherapy - last dose 2006 Denies history of Received Radiation Psychiatric Denies history of Anorexia/bulimia, Confinement Anxiety Hospitalization/Surgery History - bronchitis. Medical And Surgical History Notes Respiratory home O2 at night as needed Cardiovascular varicose veins Neurologic CVA - 1992 Review of Systems (ROS) Constitutional Symptoms (General Health) Denies complaints or symptoms of Fatigue, Fever, Chills, Marked Weight Change. Eyes Denies complaints or symptoms of Dry Eyes, Vision Changes, Glasses / Contacts. Ear/Nose/Mouth/Throat Denies complaints or symptoms of Difficult clearing ears, Sinusitis. Hematologic/Lymphatic Denies complaints or symptoms of Bleeding / Clotting Disorders, Human Immunodeficiency Virus. Respiratory Denies complaints or symptoms of Chronic or frequent coughs, Shortness of Breath. Cardiovascular Denies complaints or symptoms of Chest pain, LE edema. Gastrointestinal Denies complaints or symptoms of Frequent diarrhea, Nausea, Vomiting. Endocrine Denies complaints or symptoms of Hepatitis, Thyroid disease, Polydypsia (Excessive Thirst). Genitourinary Denies complaints or symptoms of Kidney failure/ Dialysis,  Incontinence/dribbling. Immunological Denies complaints or symptoms of Hives, Itching. Integumentary (Skin) Denies complaints or symptoms of Wounds, Bleeding or bruising tendency, Breakdown, Swelling. Musculoskeletal Denies complaints or symptoms of Muscle Pain, Muscle Weakness. Adam Merritt, Adam Merritt ([id number]) Neurologic Denies complaints or symptoms of Numbness/parasthesias, Focal/Weakness. Psychiatric Denies complaints or symptoms of Anxiety, Claustrophobia. Objective Constitutional patient is hypertensive.. pulse regular and within target range for patient.Marland Kitchen respirations regular, non-labored and within target range for patient.Marland Kitchen temperature within target range for patient.. Well-nourished and well-hydrated in no acute distress. Vitals Time Taken: 10:00 AM, Temperature: 98.3 F, Pulse: 79 bpm, Respiratory Rate: 20 breaths/min, Blood Pressure: 146/81 mmHg. Eyes conjunctiva clear no eyelid edema noted. pupils equal round and reactive to light and accommodation. Ears, Nose, Mouth, and Throat no gross abnormality of ear auricles or external auditory canals. normal hearing noted during conversation. mucus membranes moist. Respiratory normal breathing without difficulty. Cardiovascular 2+ dorsalis pedis/posterior tibialis pulses. 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 Musculoskeletal Patient unable to walk without assistance. Psychiatric this patient is able to make decisions and demonstrates good insight into disease process. Alert and Oriented x 3. pleasant and cooperative. General Notes: Upon inspection patient's wounds again appear to just be weeping areas over his legs there is no specific wounds noted at this point that are going require a specific type of dressing. Nonetheless I do think that he really does need intervention here as far as trying to get the edema under control but also believe he needs some lifestyle changes at home in order to help things  as well that would be detailed below. Other Condition(s) Patient presents with Lymphedema located on the Right Leg. Patient  presents with Lymphedema located on the Left Leg. Assessment Active Problems ICD-10 Lymphedema, not elsewhere classified Venous insufficiency (chronic) (peripheral) Procedures There was a Four Layer Compression Therapy Procedure by Carlene Coria, RN. Post procedure Diagnosis Wound #: Same as Pre-Procedure There was a Four Layer Compression Therapy Procedure by Carlene Coria, RN. Post procedure Diagnosis Wound #: Same as Pre-Procedure XAVIAN, HARDCASTLE ([id number]) Plan Follow-up Appointments: Return Appointment in 1 week. Bathing/ Shower/ Hygiene: May shower with wound dressing protected with water repellent cover or cast protector. Edema Control - Lymphedema / Segmental Compressive Device / Other: 4 Layer Compression System (Left, Right, Bilateral) Lymphedema. - bilat Elevate, Exercise Daily and Avoid Standing for Long Periods of Time. Elevate legs to the level of the heart and pump ankles as often as possible Elevate leg(s) parallel to the floor when sitting. 1. I would recommend currently that we go ahead and continue with the wound care measures as before that always helped him in the past. I do not think he is going require any alginate at this point although I think an ABD pad to cover everything and then following a 4-layer compression wrap is the right thing to do. 2. I am also can recommend he needs to be elevating his legs much as possible to try to keep edema under control. I discussed that with him today and he is in agreement with that plan. With that being said I think a recliner would benefit him his wife states that they really probably need to get 1 so that he can get his feet up. 3. Also think he should be sleeping in the bed that will be helpful for him as well. We will see patient back for reevaluation in 1 week here in the clinic. If anything  worsens or changes patient will contact our office for additional recommendations. Electronic Signature(s) Signed: 08/20/2020 3:33:08 PM By: Worthy Keeler PA-C Entered By: Worthy Keeler on 08/20/2020 15:33:08 Adam Merritt ([id number]) -------------------------------------------------------------------------------- ROS/PFSH Details Patient Name: Adam Merritt Date of Service: 08/20/2020 9:30 AM Medical Record Number: [id number] Patient Account Number: [id number] Date of Birth/Sex: [birth year] (75 y.o. M) Treating RN: Carlene Coria Primary Care Provider: [name] Other Clinician: Jeanine Luz Referring Provider: [name] Treating Provider/Extender: Skipper Cliche in Treatment: 0 Information Obtained From Patient Constitutional Symptoms (General Health) Complaints and Symptoms: Negative for: Fatigue; Fever; Chills; Marked Weight Change Eyes Complaints and Symptoms: Negative for: Dry Eyes; Vision Changes; Glasses / Contacts Medical History: Negative for: Cataracts; Glaucoma; Optic Neuritis Ear/Nose/Mouth/Throat Complaints and Symptoms: Negative for: Difficult clearing ears; Sinusitis Medical History: Negative for: Chronic sinus problems/congestion; Middle ear problems Hematologic/Lymphatic Complaints and Symptoms: Negative for: Bleeding / Clotting Disorders; Human Immunodeficiency Virus Medical History: Positive for: Anemia - aplastic anemia; Lymphedema Negative for: Hemophilia; Human Immunodeficiency Virus; Sickle Cell Disease Respiratory Complaints and Symptoms: Negative for: Chronic or frequent coughs; Shortness of Breath Medical History: Positive for: Chronic Obstructive Pulmonary Disease (COPD); Sleep Apnea Negative for: Aspiration; Asthma; Pneumothorax; Tuberculosis Past Medical History Notes: home O2 at night as needed Cardiovascular Complaints and Symptoms: Negative for: Chest pain; LE edema Medical History: Positive for: Congestive Heart Failure;  Coronary Artery Disease; Hypertension; Peripheral Venous Disease Negative for: Angina; Arrhythmia; Deep Vein Thrombosis; Hypotension; Myocardial Infarction; Peripheral Arterial Disease; Phlebitis; Vasculitis Past Medical History Notes: varicose veins Gastrointestinal Complaints and Symptoms: Negative for: Frequent diarrhea; Nausea; Vomiting Medical History: Adam Merritt, SAKUMA ([id number]) Negative for: Cirrhosis ; Colitis; Crohnos; Hepatitis A; Hepatitis B; Hepatitis C Endocrine Complaints  and Symptoms: Negative for: Hepatitis; Thyroid disease; Polydypsia (Excessive Thirst) Medical History: Positive for: Type II Diabetes Negative for: Type I Diabetes Time with diabetes: since 2006 Treated with: Insulin Blood sugar tested every day: Yes Tested : Blood sugar testing results: Breakfast: 135 Genitourinary Complaints and Symptoms: Negative for: Kidney failure/ Dialysis; Incontinence/dribbling Medical History: Negative for: End Stage Renal Disease Immunological Complaints and Symptoms: Negative for: Hives; Itching Medical History: Negative for: Lupus Erythematosus; Raynaudos; Scleroderma Integumentary (Skin) Complaints and Symptoms: Negative for: Wounds; Bleeding or bruising tendency; Breakdown; Swelling Medical History: Negative for: History of Burn; History of pressure wounds Musculoskeletal Complaints and Symptoms: Negative for: Muscle Pain; Muscle Weakness Medical History: Positive for: Osteoarthritis Negative for: Gout; Rheumatoid Arthritis; Osteomyelitis Neurologic Complaints and Symptoms: Negative for: Numbness/parasthesias; Focal/Weakness Medical History: Positive for: Neuropathy Negative for: Dementia; Quadriplegia; Paraplegia; Seizure Disorder Past Medical History Notes: CVA - 1992 Psychiatric Complaints and Symptoms: Negative for: Anxiety; Claustrophobia Medical History: Negative for: Anorexia/bulimia; Confinement Anxiety Oncologic Medical History: Positive  for: Received Chemotherapy - last dose 2006 Venable, Matthew W. ([id number]) Negative for: Received Radiation Immunizations Pneumococcal Vaccine: Received Pneumococcal Vaccination: Yes Implantable Devices None Hospitalization / Surgery History Type of Hospitalization/Surgery bronchitis Family and Social History Cancer: Yes - Paternal Grandparents; Diabetes: No; Heart Disease: No; Hereditary Spherocytosis: No; Hypertension: No; Kidney Disease: Yes - Siblings; Lung Disease: Yes - Mother; Seizures: No; Stroke: No; Thyroid Problems: No; Tuberculosis: No; Never smoker; Marital Status - Married; Alcohol Use: Never; Drug Use: No History; Caffeine Use: Never; Financial Concerns: No; Food, Clothing or Shelter Needs: No; Support System Lacking: No; Transportation Concerns: No Electronic Signature(s) Signed: 08/20/2020 2:55:19 PM By: Jeanine Luz Signed: 08/20/2020 4:40:31 PM By: Worthy Keeler PA-C Signed: 09/10/2020 8:11:40 AM By: Carlene Coria RN Entered By: Jeanine Luz on 08/20/2020 09:56:42 Adam Merritt ([id number]) -------------------------------------------------------------------------------- SuperBill Details Patient Name: Adam Merritt Date of Service: 08/20/2020 Medical Record Number: [id number] Patient Account Number: [id number] Date of Birth/Sex: September 29, 1945 (75 y.o. M) Treating RN: Carlene Coria Primary Care Provider: [name] Other Clinician: Jeanine Luz Referring Provider: [name] Treating Provider/Extender: Jeri Cos [name]: 0 Diagnosis Coding ICD-10 Codes Code Description I89.0 Lymphedema, not elsewhere classified I87.2 Venous insufficiency (chronic) (peripheral) Facility Procedures CPT4: Description Modifier Quantity Code [id number] - WOUND CARE VISIT-LEV 3 EST PT 25 1 CPT4: [id number] BILATERAL: Application of multi-layer venous compression system; leg (below knee), including 1 ankle and foot. Physician Procedures CPT4 Code:  [id number] Description: 69507 - WC PHYS LEVEL 3 - EST PT Modifier: Quantity: 1 CPT4 Code: Description: ICD-10 Diagnosis Description I89.0 Lymphedema, not elsewhere classified I87.2 Venous insufficiency (chronic) (peripheral) Modifier: Quantity: Electronic Signature(s) Signed: 08/20/2020 3:34:14 PM By: Worthy Keeler PA-C Entered By: Worthy Keeler on 08/20/2020 15:34:14

## 2020-09-10 NOTE — Progress Notes (Signed)
JAYDON, AVINA ([id number]) Visit Report for 09/03/2020 Arrival Information Details Patient Name: Adam Merritt, Adam Merritt Date of Service: 09/03/2020 12:45 PM Medical Record Number: [id number] Patient Account Number: [id number] Date of Birth/Sex: 20-May-1946 (75 y.o. M) Treating RN: Carlene Coria Primary Care Loveda Colaizzi: [name] Other Clinician: Jeanine Luz Referring Sophiya Morello: [name] Treating Skylynne Schlechter/Extender: Skipper Cliche in Treatment: 2 Visit Information History Since Last Visit Added or deleted any medications: No Patient Arrived: Wheel Chair Had a fall or experienced change in No Arrival Time: 13:04 activities of daily living that may affect Accompanied By: self risk of falls: Transfer Assistance: EasyPivot Patient Lift Hospitalized since last visit: No Patient Identification Verified: Yes Pain Present Now: No Secondary Verification Process Completed: Yes Electronic Signature(s) Signed: 09/06/2020 4:17:01 PM By: Jeanine Luz Entered By: Jeanine Luz on 09/03/2020 13:04:48 Adam Merritt ([id number]) -------------------------------------------------------------------------------- Clinic Level of Care Assessment Details Patient Name: Adam Merritt Date of Service: 09/03/2020 12:45 PM Medical Record Number: [id number] Patient Account Number: [id number] Date of Birth/Sex: 03/27/1946 (75 y.o. M) Treating RN: Carlene Coria Primary Care Cherae Marton: [name] Other Clinician: Jeanine Luz Referring Isola Mehlman: [name] Treating Chaske Paskett/Extender: Skipper Cliche in Treatment: 2 Clinic Level of Care Assessment Items TOOL 1 Quantity Score []  - Use when EandM and Procedure is performed on INITIAL visit 0 ASSESSMENTS - Nursing Assessment / Reassessment []  - General Physical Exam (combine w/ comprehensive assessment (listed just below) when performed on new 0 pt. evals) []  - 0 Comprehensive Assessment (HX, ROS, Risk Assessments, Wounds Hx, etc.) ASSESSMENTS - Wound  and Skin Assessment / Reassessment []  - Dermatologic / Skin Assessment (not related to wound area) 0 ASSESSMENTS - Ostomy and/or Continence Assessment and Care []  - Incontinence Assessment and Management 0 []  - 0 Ostomy Care Assessment and Management (repouching, etc.) PROCESS - Coordination of Care []  - Simple Patient / Family Education for ongoing care 0 []  - 0 Complex (extensive) Patient / Family Education for ongoing care []  - 0 Staff obtains Programmer, systems, Records, Test Results / Process Orders []  - 0 Staff telephones HHA, Nursing Homes / Clarify orders / etc []  - 0 Routine Transfer to another Facility (non-emergent condition) []  - 0 Routine Hospital Admission (non-emergent condition) []  - 0 New Admissions / Biomedical engineer / Ordering NPWT, Apligraf, etc. []  - 0 Emergency Hospital Admission (emergent condition) PROCESS - Special Needs []  - Pediatric / Minor Patient Management 0 []  - 0 Isolation Patient Management []  - 0 Hearing / Language / Visual special needs []  - 0 Assessment of Community assistance (transportation, D/C planning, etc.) []  - 0 Additional assistance / Altered mentation []  - 0 Support Surface(s) Assessment (bed, cushion, seat, etc.) INTERVENTIONS - Miscellaneous []  - External ear exam 0 []  - 0 Patient Transfer (multiple staff / Civil Service fast streamer / Similar devices) []  - 0 Simple Staple / Suture removal (25 or less) []  - 0 Complex Staple / Suture removal (26 or more) []  - 0 Hypo/Hyperglycemic Management (do not check if billed separately) []  - 0 Ankle / Brachial Index (ABI) - do not check if billed separately Has the patient been seen at the hospital within the last three years: Yes Total Score: 0 Level Of Care: ____ Adam Merritt ([id number]) Electronic Signature(s) Signed: 09/10/2020 8:11:40 AM By: Carlene Coria RN Entered By: Carlene Coria on 09/03/2020 13:30:35 Adam Merritt  ([id number]) -------------------------------------------------------------------------------- Compression Therapy Details Patient Name: Adam Merritt Date of Service: 09/03/2020 12:45 PM Medical Record Number: [id number] Patient Account Number: [id number] Date of  Birth/Sex: [birth year] (75 y.o. M) Treating RN: Carlene Coria Primary Care Samyukta Cura: [name] Other Clinician: Jeanine Luz Referring Keyra Virella: [name] Treating Armiyah Capron/Extender: Jeri Cos [name]: 2 Compression Therapy Performed for Wound Assessment: NonWound Condition Lymphedema - Left Leg Performed By: Clinician Carlene Coria, RN Compression Type: Four Layer Post Procedure Diagnosis Same as Pre-procedure Electronic Signature(s) Signed: 09/10/2020 8:11:40 AM By: Carlene Coria RN Entered By: Carlene Coria on 09/03/2020 13:27:49 Adam Merritt ([id number]) -------------------------------------------------------------------------------- Encounter Discharge Information Details Patient Name: Adam Merritt Date of Service: 09/03/2020 12:45 PM Medical Record Number: [id number] Patient Account Number: [id number] Date of Birth/Sex: May 03, 1946 (75 y.o. M) Treating RN: Dolan Amen Primary Care Delila Kuklinski: [name] Other Clinician: Jeanine Luz Referring Zira Helinski: [name] Treating Stanislawa Gaffin/Extender: Skipper Cliche in Treatment: 2 Encounter Discharge Information Items Discharge Condition: Stable Ambulatory Status: Wheelchair Discharge Destination: Home Transportation: Private Auto Accompanied By: wife Schedule Follow-up Appointment: Yes Clinical Summary of Care: Electronic Signature(s) Signed: 09/03/2020 3:53:39 PM By: Georges Mouse, [name] Breeding RN Entered By: Georges Mouse, [name] Breeding on 09/03/2020 13:47:53 Adam Merritt ([id number]) -------------------------------------------------------------------------------- Lower Extremity Assessment Details Patient Name: Adam Merritt Date of Service:  09/03/2020 12:45 PM Medical Record Number: [id number] Patient Account Number: [id number] Date of Birth/Sex: [birth year] (75 y.o. M) Treating RN: Carlene Coria Primary Care Mallissa Lorenzen: [name] Other Clinician: Jeanine Luz Referring Jed Kutch: [name] Treating Jock Mahon/Extender: Jeri Cos [name]: 2 Edema Assessment Assessed: [Left: Yes] [Right: Yes] Edema: [Left: Yes] [Right: Yes] Calf Left: Right: Point of Measurement: 31 cm From Medial Instep 52.5 cm 38.5 cm Ankle Left: Right: Point of Measurement: 12 cm From Medial Instep 40.5 cm 31.5 cm Vascular Assessment Pulses: Dorsalis Pedis Palpable: [Left:Yes] [Right:Yes] Electronic Signature(s) Signed: 09/06/2020 4:17:01 PM By: Jeanine Luz Signed: 09/10/2020 8:11:40 AM By: Carlene Coria RN Entered By: Jeanine Luz on 09/03/2020 13:19:48 Adam Merritt ([id number]) -------------------------------------------------------------------------------- Multi Wound Chart Details Patient Name: Adam Merritt Date of Service: 09/03/2020 12:45 PM Medical Record Number: [id number] Patient Account Number: [id number] Date of Birth/Sex: [birth year] (75 y.o. M) Treating RN: Carlene Coria Primary Care Carmencita Cusic: [name] Other Clinician: Jeanine Luz Referring Parveen Freehling: [name] Treating Frankye Schwegel/Extender: Jeri Cos [name]: 2 Vital Signs Height(in): Pulse(bpm): 60 Weight(lbs): Blood Pressure(mmHg): 158/82 Body Mass Index(BMI): Temperature(F): 97.8 Respiratory Rate(breaths/min): 18 Wound Assessments Treatment Notes Electronic Signature(s) Signed: 09/10/2020 8:11:40 AM By: Carlene Coria RN Entered By: Carlene Coria on 09/03/2020 13:27:14 Adam Merritt ([id number]) -------------------------------------------------------------------------------- [facility] Details Patient Name: Adam Merritt Date of Service: 09/03/2020 12:45 PM Medical Record Number: [id number] Patient Account Number:  [id number] Date of Birth/Sex: 01-02-1946 (75 y.o. M) Treating RN: Carlene Coria Primary Care Benjamin Casanas: [name] Other Clinician: Jeanine Luz Referring Jozeph Persing: [name] Treating Eberardo Demello/Extender: Jeri Cos [name]: 2 Active Inactive Wound/Skin Impairment Nursing Diagnoses: Knowledge deficit related to ulceration/compromised skin integrity Goals: Patient/caregiver will verbalize understanding of skin care regimen Date Initiated: 08/20/2020 Target Resolution Date: 09/20/2020 Goal Status: Active Interventions: Assess patient/caregiver ability to obtain necessary supplies Assess patient/caregiver ability to perform ulcer/skin care regimen upon admission and as needed Assess ulceration(s) every visit Notes: Electronic Signature(s) Signed: 09/10/2020 8:11:40 AM By: Carlene Coria RN Entered By: Carlene Coria on 09/03/2020 13:27:00 Adam Merritt ([id number]) -------------------------------------------------------------------------------- Non-Wound Condition Assessment Details Patient Name: Adam Merritt Date of Service: 09/03/2020 12:45 PM Medical Record Number: [id number] Patient Account Number: [id number] Date of Birth/Sex: 1945-08-31 (75 y.o. M) Treating RN: Carlene Coria Primary Care Onedia Vargus: [name] Other Clinician: Jeanine Luz Referring Claritza July:  [name] Treating Lousie Calico/Extender: Jeri Cos [name]: 2 Non-Wound Condition: Condition: Lymphedema Location: Leg Side: Right Photos Electronic Signature(s) Signed: 09/06/2020 4:17:01 PM By: Jeanine Luz Signed: 09/10/2020 8:11:40 AM By: Carlene Coria RN Entered By: Jeanine Luz on 09/03/2020 13:20:18 Adam Merritt ([id number]) -------------------------------------------------------------------------------- Non-Wound Condition Assessment Details Patient Name: Adam Merritt Date of Service: 09/03/2020 12:45 PM Medical Record Number: [id number] Patient Account Number: [id number] Date  of Birth/Sex: [birth year] (75 y.o. M) Treating RN: Carlene Coria Primary Care Jule Whitsel: [name] Other Clinician: Jeanine Luz Referring Shannen Vernon: [name] Treating Haskell Rihn/Extender: Jeri Cos [name]: 2 Non-Wound Condition: Condition: Lymphedema Location: Leg Side: Left Photos Electronic Signature(s) Signed: 09/06/2020 4:17:01 PM By: Jeanine Luz Signed: 09/10/2020 8:11:40 AM By: Carlene Coria RN Entered By: Jeanine Luz on 09/03/2020 13:20:37 Adam Merritt ([id number]) -------------------------------------------------------------------------------- Pain Assessment Details Patient Name: Adam Merritt Date of Service: 09/03/2020 12:45 PM Medical Record Number: [id number] Patient Account Number: [id number] Date of Birth/Sex: 02/05/1946 (75 y.o. M) Treating RN: Carlene Coria Primary Care Mystery Schrupp: [name] Other Clinician: Jeanine Luz Referring Geovanni Rahming: [name] Treating Traevion Poehler/Extender: Skipper Cliche in Treatment: 2 Active Problems Location of Pain Severity and Description of Pain Patient Has Paino No Site Locations Rate the pain. Current Pain Level: 0 Pain Management and Medication Current Pain Management: Electronic Signature(s) Signed: 09/06/2020 4:17:01 PM By: Jeanine Luz Signed: 09/10/2020 8:11:40 AM By: Carlene Coria RN Entered By: Jeanine Luz on 09/03/2020 13:05:19 Adam Merritt ([id number]) -------------------------------------------------------------------------------- Patient/Caregiver Education Details Patient Name: Adam Merritt Date of Service: 09/03/2020 12:45 PM Medical Record Number: [id number] Patient Account Number: [id number] Date of Birth/Gender: [birth year] (75 y.o. M) Treating RN: Carlene Coria Primary Care Physician: [name] Other Clinician: Jeanine Luz Referring Physician: [name] Treating Physician/Extender: Skipper Cliche in Treatment: 2 Education Assessment Education Provided  To: Patient Education Topics Provided Wound/Skin Impairment: Methods: Explain/Verbal Responses: State content correctly Electronic Signature(s) Signed: 09/10/2020 8:11:40 AM By: Carlene Coria RN Entered By: Carlene Coria on 09/03/2020 13:31:05 Adam Merritt ([id number]) -------------------------------------------------------------------------------- Vitals Details Patient Name: Adam Merritt Date of Service: 09/03/2020 12:45 PM Medical Record Number: [id number] Patient Account Number: [id number] Date of Birth/Sex: August 30, 1945 (75 y.o. M) Treating RN: Carlene Coria Primary Care Keymoni Mccaster: [name] Other Clinician: Jeanine Luz Referring Anjuli Gemmill: [name] Treating Cuca Benassi/Extender: Jeri Cos [name]: 2 Vital Signs Time Taken: 13:04 Temperature (F): 97.8 Pulse (bpm): 60 Respiratory Rate (breaths/min): 18 Blood Pressure (mmHg): 158/82 Reference Range: 80 - 120 mg / dl Electronic Signature(s) Signed: 09/06/2020 4:17:01 PM By: Jeanine Luz Entered By: Jeanine Luz on 09/03/2020 13:05:11

## 2020-09-10 NOTE — Progress Notes (Signed)
OSRIC, KLOPF ([id number]) Visit Report for 08/20/2020 Allergy List Details Patient Name: Adam Merritt, Adam Merritt Date of Service: 08/20/2020 9:30 AM Medical Record Number: [id number] Patient Account Number: [id number] Date of Birth/Sex: 12-03-1945 (75 y.o. M) Treating RN: Carlene Coria Primary Care Earlin Sweeden: [name] Other Clinician: Jeanine Luz Referring Virdie Penning: [name] Treating Sharvi Mooneyhan/Extender: Jeri Cos [name]: 0 Allergies Active Allergies Corticosteroids (Glucocorticoids) Allergy Notes Electronic Signature(s) Signed: 08/20/2020 2:55:19 PM By: Jeanine Luz Entered By: Jeanine Luz on 08/20/2020 10:02:00 Adam Merritt ([id number]) -------------------------------------------------------------------------------- Arrival Information Details Patient Name: Adam Merritt Date of Service: 08/20/2020 9:30 AM Medical Record Number: [id number] Patient Account Number: [id number] Date of Birth/Sex: 1946-01-26 (75 y.o. M) Treating RN: Carlene Coria Primary Care Bascom Biel: [name] Other Clinician: Jeanine Luz Referring Kealani Leckey: [name] Treating Daouda Lonzo/Extender: Skipper Cliche in Treatment: 0 Visit Information Patient Arrived: Walker Arrival Time: 09:49 Accompanied By: wife Transfer Assistance: EasyPivot Patient Lift Patient Identification Verified: Yes History Since Last Visit Added or deleted any medications: No Had a fall or experienced change in activities of daily living that may affect risk of falls: Yes Pain Present Now: Yes Electronic Signature(s) Signed: 08/20/2020 2:55:19 PM By: Jeanine Luz Entered By: Jeanine Luz on 08/20/2020 10:07:14 Adam Merritt ([id number]) -------------------------------------------------------------------------------- Clinic Level of Care Assessment Details Patient Name: Adam Merritt Date of Service: 08/20/2020 9:30 AM Medical Record Number: [id number] Patient Account Number: [id number] Date of  Birth/Sex: July 11, 1945 (75 y.o. M) Treating RN: Carlene Coria Primary Care Talena Neira: [name] Other Clinician: Jeanine Luz Referring Arlen Legendre: [name] Treating Kinsey Karch/Extender: Skipper Cliche in Treatment: 0 Clinic Level of Care Assessment Items TOOL 1 Quantity Score X - Use when EandM and Procedure is performed on INITIAL visit 1 0 ASSESSMENTS - Nursing Assessment / Reassessment X - General Physical Exam (combine w/ comprehensive assessment (listed just below) when performed on new 1 20 pt. evals) X- 1 25 Comprehensive Assessment (HX, ROS, Risk Assessments, Wounds Hx, etc.) ASSESSMENTS - Wound and Skin Assessment / Reassessment []  - Dermatologic / Skin Assessment (not related to wound area) 0 ASSESSMENTS - Ostomy and/or Continence Assessment and Care []  - Incontinence Assessment and Management 0 []  - 0 Ostomy Care Assessment and Management (repouching, etc.) PROCESS - Coordination of Care X - Simple Patient / Family Education for ongoing care 1 15 []  - 0 Complex (extensive) Patient / Family Education for ongoing care X- 1 10 Staff obtains Consents, Records, Test Results / Process Orders []  - 0 Staff telephones HHA, Nursing Homes / Clarify orders / etc []  - 0 Routine Transfer to another Facility (non-emergent condition) []  - 0 Routine Hospital Admission (non-emergent condition) X- 1 15 New Admissions / Biomedical engineer / Ordering NPWT, Apligraf, etc. []  - 0 Emergency Hospital Admission (emergent condition) PROCESS - Special Needs []  - Pediatric / Minor Patient Management 0 []  - 0 Isolation Patient Management []  - 0 Hearing / Language / Visual special needs []  - 0 Assessment of Community assistance (transportation, D/C planning, etc.) []  - 0 Additional assistance / Altered mentation []  - 0 Support Surface(s) Assessment (bed, cushion, seat, etc.) INTERVENTIONS - Miscellaneous []  - External ear exam 0 []  - 0 Patient Transfer (multiple staff /  Civil Service fast streamer / Similar devices) []  - 0 Simple Staple / Suture removal (25 or less) []  - 0 Complex Staple / Suture removal (26 or more) []  - 0 Hypo/Hyperglycemic Management (do not check if billed separately) []  - 0 Ankle / Brachial Index (ABI) - do not check  if billed separately Has the patient been seen at the hospital within the last three years: Yes Total Score: 85 Level Of Care: New/Established - [address] Adam Merritt, Adam Merritt ([id number]) Electronic Signature(s) Signed: 09/10/2020 8:11:40 AM By: Carlene Coria RN Entered By: Carlene Coria on 08/20/2020 10:52:22 Adam Merritt, Adam Merritt ([id number]) -------------------------------------------------------------------------------- Compression Therapy Details Patient Name: Adam Merritt Date of Service: 08/20/2020 9:30 AM Medical Record Number: [id number] Patient Account Number: [id number] Date of Birth/Sex: September 30, 1945 (75 y.o. M) Treating RN: Carlene Coria Primary Care Gaylin Bulthuis: [name] Other Clinician: Jeanine Luz Referring Izella Ybanez: [name] Treating Deylan Canterbury/Extender: Jeri Cos [name]: 0 Compression Therapy Performed for Wound Assessment: NonWound Condition Lymphedema - Right Leg Performed By: Clinician Carlene Coria, RN Compression Type: Four Layer Post Procedure Diagnosis Same as Pre-procedure Electronic Signature(s) Signed: 09/10/2020 8:11:40 AM By: Carlene Coria RN Entered By: Carlene Coria on 08/20/2020 10:50:11 Adam Merritt ([id number]) -------------------------------------------------------------------------------- Compression Therapy Details Patient Name: Adam Merritt Date of Service: 08/20/2020 9:30 AM Medical Record Number: [id number] Patient Account Number: [id number] Date of Birth/Sex: [birth year] (75 y.o. M) Treating RN: Carlene Coria Primary Care Dalani Mette: [name] Other Clinician: Jeanine Luz Referring Leul Narramore: [name] Treating Yara Tomkinson/Extender: Jeri Cos [name]: 0 Compression  Therapy Performed for Wound Assessment: NonWound Condition Lymphedema - Left Leg Performed By: Clinician Carlene Coria, RN Compression Type: Four Layer Post Procedure Diagnosis Same as Pre-procedure Electronic Signature(s) Signed: 09/10/2020 8:11:40 AM By: Carlene Coria RN Entered By: Carlene Coria on 08/20/2020 10:50:25 Adam Merritt ([id number]) -------------------------------------------------------------------------------- Encounter Discharge Information Details Patient Name: Adam Merritt Date of Service: 08/20/2020 9:30 AM Medical Record Number: [id number] Patient Account Number: [id number] Date of Birth/Sex: 18-Jan-1946 (75 y.o. M) Treating RN: Dolan Amen Primary Care Hall Birchard: [name] Other Clinician: Jeanine Luz Referring Keymiah Lyles: [name] Treating Brenten Janney/Extender: Skipper Cliche in Treatment: 0 Encounter Discharge Information Items Discharge Condition: Stable Ambulatory Status: Wheelchair Discharge Destination: Home Transportation: Private Auto Accompanied By: wife Schedule Follow-up Appointment: Yes Clinical Summary of Care: Electronic Signature(s) Signed: 08/20/2020 4:50:51 PM By: Georges Mouse, [name] Breeding RN Entered By: Georges Mouse, [name] Breeding on 08/20/2020 16:50:51 Adam Merritt ([id number]) -------------------------------------------------------------------------------- Lower Extremity Assessment Details Patient Name: Adam Merritt Date of Service: 08/20/2020 9:30 AM Medical Record Number: [id number] Patient Account Number: [id number] Date of Birth/Sex: [birth year] (75 y.o. M) Treating RN: Carlene Coria Primary Care Deicy Rusk: [name] Other Clinician: Jeanine Luz Referring Earland Reish: [name] Treating Jauan Wohl/Extender: Jeri Cos [name]: 0 Edema Assessment Assessed: [Left: Yes] [Right: Yes] Edema: [Left: Yes] [Right: Yes] Calf Left: Right: Point of Measurement: 31 cm From Medial Instep 61.5 cm 56 cm Ankle Left:  Right: Point of Measurement: 12 cm From Medial Instep 37 cm 30.5 cm Electronic Signature(s) Signed: 08/20/2020 2:55:19 PM By: Jeanine Luz Signed: 09/10/2020 8:11:40 AM By: Carlene Coria RN Entered By: Jeanine Luz on 08/20/2020 10:03:15 Adam Merritt ([id number]) -------------------------------------------------------------------------------- Multi Wound Chart Details Patient Name: Adam Merritt Date of Service: 08/20/2020 9:30 AM Medical Record Number: [id number] Patient Account Number: [id number] Date of Birth/Sex: 1945-12-17 (75 y.o. M) Treating RN: Carlene Coria Primary Care Enyah Moman: [name] Other Clinician: Jeanine Luz Referring Asli Tokarski: [name] Treating Arien Benincasa/Extender: Jeri Cos [name]: 0 Vital Signs Height(in): Pulse(bpm): 79 Weight(lbs): Blood Pressure(mmHg): 146/81 Body Mass Index(BMI): Temperature(F): 98.3 Respiratory Rate(breaths/min): 20 Wound Assessments Treatment Notes Electronic Signature(s) Signed: 09/10/2020 8:11:40 AM By: Carlene Coria RN Entered By: Carlene Coria on 08/20/2020 10:48:01 Adam Merritt ([id number]) -------------------------------------------------------------------------------- [place] Details Patient Name: Adam Merritt,  Adam Merritt Date of Service: 08/20/2020 9:30 AM Medical Record Number: [id number] Patient Account Number: [id number] Date of Birth/Sex: 10-01-1945 (75 y.o. M) Treating RN: Carlene Coria Primary Care Cassanda Walmer: [name] Other Clinician: Jeanine Luz Referring Loyal Holzheimer: [name] Treating Miyoko Hashimi/Extender: Jeri Cos [name]: 0 Active Inactive Wound/Skin Impairment Nursing Diagnoses: Knowledge deficit related to ulceration/compromised skin integrity Goals: Patient/caregiver will verbalize understanding of skin care regimen Date Initiated: 08/20/2020 Target Resolution Date: 09/20/2020 Goal Status: Active Interventions: Assess patient/caregiver ability to  obtain necessary supplies Assess patient/caregiver ability to perform ulcer/skin care regimen upon admission and as needed Assess ulceration(s) every visit Notes: Electronic Signature(s) Signed: 09/10/2020 8:11:40 AM By: Carlene Coria RN Entered By: Carlene Coria on 08/20/2020 10:47:38 Adam Merritt ([id number]) -------------------------------------------------------------------------------- Non-Wound Condition Assessment Details Patient Name: Adam Merritt Date of Service: 08/20/2020 9:30 AM Medical Record Number: [id number] Patient Account Number: [id number] Date of Birth/Gender: [birth year] (75 y.o. M) Treating RN: Carlene Coria Primary Care Physician: [name] Other Clinician: Jeanine Luz Referring Physician: [name] Treating Physician/Extender: Jeri Cos [name]: 0 Non-Wound Condition: Condition: Lymphedema Location: Leg Side: Right Photos Electronic Signature(s) Signed: 09/10/2020 8:11:40 AM By: Carlene Coria RN Entered By: Carlene Coria on 08/20/2020 10:49:43 Adam Merritt ([id number]) -------------------------------------------------------------------------------- Non-Wound Condition Assessment Details Patient Name: Adam Merritt Date of Service: 08/20/2020 9:30 AM Medical Record Number: [id number] Patient Account Number: [id number] Date of Birth/Gender: 05-Apr-1946 (75 y.o. M) Treating RN: Carlene Coria Primary Care Physician: [name] Other Clinician: Jeanine Luz Referring Physician: [name] Treating Physician/Extender: Jeri Cos [name]: 0 Non-Wound Condition: Condition: Lymphedema Location: Leg Side: Left Photos Electronic Signature(s) Signed: 09/10/2020 8:11:40 AM By: Carlene Coria RN Entered By: Carlene Coria on 08/20/2020 10:49:44 Adam Merritt ([id number]) -------------------------------------------------------------------------------- Pain Assessment Details Patient Name: Adam Merritt Date of Service: 08/20/2020  9:30 AM Medical Record Number: [id number] Patient Account Number: [id number] Date of Birth/Sex: [birth year] (75 y.o. M) Treating RN: Carlene Coria Primary Care James Lafalce: [name] Other Clinician: Jeanine Luz Referring Sherlyn Ebbert: [name] Treating Ocie Tino/Extender: Skipper Cliche in Treatment: 0 Active Problems Location of Pain Severity and Description of Pain Patient Has Paino Yes Site Locations Rate the pain. Current Pain Level: 3 Character of Pain Describe the Pain: Burning Pain Management and Medication Current Pain Management: Electronic Signature(s) Signed: 08/20/2020 2:55:19 PM By: Jeanine Luz Signed: 09/10/2020 8:11:40 AM By: Carlene Coria RN Entered By: Jeanine Luz on 08/20/2020 09:53:32 Adam Merritt ([id number]) -------------------------------------------------------------------------------- Patient/Caregiver Education Details Patient Name: Adam Merritt Date of Service: 08/20/2020 9:30 AM Medical Record Number: [id number] Patient Account Number: [id number] Date of Birth/Gender: 1946/05/26 (75 y.o. M) Treating RN: Carlene Coria Primary Care Physician: [name] Other Clinician: Jeanine Luz Referring Physician: [name] Treating Physician/Extender: Skipper Cliche in Treatment: 0 Education Assessment Education Provided To: Patient Education Topics Provided Wound/Skin Impairment: Methods: Explain/Verbal Responses: State content correctly Electronic Signature(s) Signed: 09/10/2020 8:11:40 AM By: Carlene Coria RN Entered By: Carlene Coria on 08/20/2020 10:52:48 Adam Merritt ([id number]) -------------------------------------------------------------------------------- Vitals Details Patient Name: Adam Merritt Date of Service: 08/20/2020 9:30 AM Medical Record Number: [id number] Patient Account Number: [id number] Date of Birth/Sex: Jul 02, 1945 (75 y.o. M) Treating RN: Carlene Coria Primary Care Domenick Quebedeaux: [name] Other Clinician:  Jeanine Luz Referring Christopherjohn Schiele: [name] Treating Emoni Whitworth/Extender: Jeri Cos [name]: 0 Vital Signs Time Taken: 10:00 Temperature (F): 98.3 Pulse (bpm): 79 Respiratory Rate (breaths/min): 20 Blood Pressure (mmHg): 146/81 Reference Range: 80 - 120 mg / dl Electronic Signature(s) Signed: 08/20/2020  2:55:19 PM By: Jeanine Luz Entered By: Jeanine Luz on 08/20/2020 10:05:12

## 2020-09-10 NOTE — Progress Notes (Addendum)
Adam Merritt, Adam Merritt ([id number]) Visit Report for 09/10/2020 Chief Complaint Document Details Patient Name: Adam Merritt, Adam Merritt Date of Service: 09/10/2020 1:45 PM Medical Record Number: [id number] Patient Account Number: [id number] Date of Birth/Sex: 23-Jan-1946 (75 y.o. M) Treating RN: Carlene Coria Primary Care Provider: [name] Other Clinician: Jeanine Luz Referring Provider: [name] Treating Provider/Extender: Skipper Cliche in Treatment: 3 Information Obtained from: Patient Chief Complaint Bilateral LE Ulcers Electronic Signature(s) Signed: 09/10/2020 1:44:23 PM By: Worthy Keeler PA-C Entered By: Worthy Keeler on 09/10/2020 13:44:22 Adam Merritt ([id number]) -------------------------------------------------------------------------------- HPI Details Patient Name: Adam Merritt Date of Service: 09/10/2020 1:45 PM Medical Record Number: [id number] Patient Account Number: [id number] Date of Birth/Sex: Jul 06, 1945 (75 y.o. M) Treating RN: Carlene Coria Primary Care Provider: [name] Other Clinician: Jeanine Luz Referring Provider: [name] Treating Provider/Extender: Skipper Cliche in Treatment: 3 History of Present Illness HPI Description: 10/18/17-He is here for initial evaluation of bilateral lower extremity ulcerations in the presence of venous insufficiency and lymphedema. He has been seen by vascular medicine in the past, Dr. Lucky Cowboy, last seen in 2016. He does have a history of abnormal ABIs, which is to be expected given his lymphedema and venous insufficiency. According to Epic, it appears that all attempts for arterial evaluation and/or angiography were not follow through with by patient. He does have a history of being seen in lymphedema clinic in 2018, stopped going approximately 6 months ago stating "it didn't do any good". He does not have lymphedema pumps, he does not have custom fit compression wrap/stockings. He is diabetic and his recent A1c last month  was 7.6. He admits to chronic bilateral lower extremity pain, no change in pain since blister and ulceration development. He is currently being treated with Levaquin for bronchitis. He has home health and we will continue. 10/25/17-He is here in follow-up evaluation for bilateral lower extremity ulcerationssubtle he remains on Levaquin for bronchitis. Right lower extremity with no evidence of drainage or ulceration, persistent left lower extremity ulceration. He states that home health has not been out since his appointment. He went to [facility] on Tuesday, studies revealed: RIGHT ABI 0.9, TBI 0.6 LEFT ABI 1.1, TBI 0.6 with triphasic flow bilaterally. We will continue his same treatment plan. He has been educated on compression therapy and need for elevation. He will benefit from lymphedema pumps 11/01/17-He is here in follow-up evaluation for left lower extremity ulcer. The right lower extremity remains healed. He has home health services, but they have not been out to see the patient for 2-3 weeks. He states it home health physical therapy changed his dressing yesterday after therapy; he placed Ace wrap compression. We are still waiting for lymphedema pumps, reordered d/t need for company change. 11/08/17-He is here in follow-up evaluation for left lower extremity ulcer. It is improved. Edema is significantly improved with compression therapy. We will continue with same treatment plan and he will follow-up next week. No word regarding lymphedema pumps 11/15/17-He is here in follow-up evaluation for left lower extremity ulcer. He is healed and will be discharged from wound care services. I have reached out to [facility] regarding his lymphedema pumps. They have been unable to reach the patient; the contact number they had with the patient's wife's cell phone and she has not answered any unrecognized calls. Contact should be made today, trial planned for next week; [facility]  will continue to follow 11/27/17 on evaluation today patient has multiple blistered areas over the  right lower extremity his left lower extremity appears to be doing okay. These blistered areas show signs of no infection which is great news. With that being said he did have some necrotic skin overlying which was mechanically debrided away with saline and gauze today without complication. Overall post debridement the wounds appear to be doing better but in general his swelling seems to be increased. This is obviously not good news. I think this is what has given rise to the blisters. 12/04/17 on evaluation today patient presents for follow-up concerning his bilateral lower extremity edema in the right lower extremity ulcers. He has been tolerating the dressing changes without complication. With that being said he has had no real issues with the wraps which is also good news. Overall I'm pleased with the progress he's been making. 12/11/17 on evaluation today patient appears to be doing rather well in regard to his right lateral lower extremity ulcer. He's been tolerating the dressing changes without complication. Fortunately there does not appear to be any evidence of infection at this time. Overall I'm pleased with the progress that is being made. Unfortunately he has been in the hospital due to having what sounds to be a stomach virus/flu fortunately that is starting to get better. 12/18/17 on evaluation today patient actually appears to be doing very well in regard to his bilateral lower extremities the swelling is under fairly good control his lymphedema pumps are still not up and running quite yet. With that being said he does have several areas of opening noted as far as wounds are concerned mainly over the left lower extremity. With that being said I do believe once he gets lymphedema pumps this would least help you mention some the fluid and preventing this from occurring. Hopefully that will be set  up soon sleeves are Artie in place at his home he just waiting for the machine. 12/25/17 on evaluation today patient actually appears to be doing excellent in fact all of his ulcers appear to have resolved his legs appear very well. I do think he needs compression stockings we have discussed this and they are actually going to go to [place] today to elastic therapy to get this fitted for him. I think that is definitely a good thing to do. Readmission: 04/09/18 upon evaluation today this patient is seen for readmission due to bilateral lower extremity lymphedema. He has significant swelling of his extremities especially on the left although the right is also swollen he has weeping from both sides. There are no obvious open wounds at this point. Fortunately he has been doing fairly well for quite a bit of time since I last saw him. Nonetheless unfortunately this seems to have reopened and is giving quite a bit of trouble. He states this began about a week ago when he first called [place] to get in to be seen. No fevers, chills, nausea, or vomiting noted at this time. He has not been using his lymphedema pumps due to the fact that they won't fit on his leg at this point likewise is also not been using his compression for essentially the same reason. 04/16/18 upon evaluation today patient actually appears to be doing a little better in regard to the fluid in his bilateral lower extremities. With that being said he's had three falls since I saw him last week. He also states that he's been feeling very poorly. I was concerned last week and feel like that the concern is still there as far as the  congestion in his chest is concerned he seems to be breathing about the same as last week but again he states he's very weak he's not even able to walk further than from the chair to the door. His wife had to buy a wheelchair just to be able to get them out of the house to get to the appointment today. This has me very  concerned. 04/23/18 on evaluation today patient actually appears to be doing much better than last week's evaluation. At that time actually had to transport him to the ER via EMS and he subsequently was admitted for acute pulmonary edema, acute renal failure, and acute congestive heart failure. Fortunately he is doing much better. Apparently they did dialyze him and were able to take off roughly 35 pounds of fluid. Nonetheless he is feeling much better both in regard to his breathing and he's able to get around much better at this time compared to previous. Overall I'm very Adam Merritt, Adam Merritt ([id number]) happy with how things are at this time. There does not appear to be any evidence of infection currently. No fevers, chills, nausea, or vomiting noted at this time. 04/30/2018 patient seen today for follow-up and management of bilateral lower extremity lymphedema. He did express being more sad today than usual due to the recent loss of his dog. He states that he has been compliant with using the lymphedema pumps. However he does admit a minute over the last 2-3 days he has not been using the pumps due to the recent loss of his dog. At this time there is no drainage or open wounds to his lower extremities. The left leg edema is measuring smaller today. Still has a significant amount of edema on bilateral lower extremities With dry flaky skin. He will be referred to the lymphedema clinic for further management. Will continue 3 layer compression wraps and follow-up in 1-2 weeks.Denies any pain, fever, chairs recently. No recent falls or injuries reported during this visit. 05/07/18 on evaluation today patient actually appears to be doing very well in regard to his lower extremities in general all things considered. With that being said he is having some pain in the legs just due to the amount of swelling. He does have an area where he had a blister on the left lateral lower extremity this is open at this  point other than that there's nothing else weeping at this time. 05/14/18 on evaluation today patient actually appears to be doing excellent all things considered in regard to his lower extremities. He still has a couple areas of weeping on each leg which has continued to be the issue for him. He does have an appointment with the lymphedema clinic although this isn't until February 2020. That was the earliest they had. In the meantime he has continued to tolerate the compression wraps without complication. 05/28/18 on evaluation today patient actually appears to be doing more poorly in regard to his left lower extremity where he has a wound open at this point. He also had a fall where he subsequently injured his right great toe which has led to an open wound at the site unfortunately. He has been tolerating the dressing changes without complication in general as far as the wraps are concerned that he has not been putting any dressing on the left 1st toe ulcer site. 06/11/18 on evaluation today patient appears to be doing much worse in regard to his bilateral lower extremity ulcers. He has been tolerating the dressing changes without  complication although his legs have not been wrapped more recently. Overall I am not very pleased with the way his legs appear. I do believe he needs to be back in compression wraps he still has not received his compression wraps from the [facility] hospital as of yet. 06/18/18 on evaluation today patient actually appears to be doing significantly better than last time I saw him. He has been tolerating the compression wraps without complication in the circumferential ulcers especially appear to be doing much better. His toe ulcer on the right in regard to the great toe is better although not as good as the legs in my pinion. No fevers chills noted 07/02/18 on evaluation today patient appears to be doing much better in regard to his lower extremity ulcers. Unfortunately since I last  saw him he's had the distal portion of his right great toe if he dated it sounds as if this actually went downhill very quickly. I had only seen him a few days prior and the toe did not appear to be infected at that point subsequently became infected very rapidly and it was decided by the surgeon that the distal portion of the toe needed to be removed. The patient seems to be doing well in this regard he tells me. With that being said his lower extremities are doing better from the standpoint of the wounds although he is significantly swollen at this point. 07/09/18 on evaluation today patient appears to be doing better in regard to the wounds on his lower extremities. In fact everything is almost completely healed he is just a small area on the left posterior lower extremity that is open at this point. 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 With that being said he needs some kind of compression it does sound like he may not be wearing his compression, that is the wraps, during the entire time between when he's here visit to visit. Apparently his wife took the current one off because it began to "fall apart". 07/16/18 on evaluation today patient appears to be extremely swollen especially in regard to his left lower extremity unfortunately. He also has a new skin tear over the left lower extremity and there's a smaller area on the right lower extremity as well. Unfortunately this seems to be due in part to blistering and fluid buildup in his leg. He did get the reduction wraps that were ordered by the [facility] hospital for him to go to lymphedema clinic. With that being said his wounds on the legs have not healed to the point to where they would likely accept them as a patient lymphedema clinic currently. We need to try to  get this to heal. With that being said he's been taking his wraps off which is not doing him any favors at this point. In fact this is probably quite counterproductive compared to what needs to occur. We will likely need to increase to a four layer compression wrap and continue to also utilize elevation and he has to keep the wraps on not take them off as he's been doing currently hasn't had a wrap on since Saturday. 07/23/18 on evaluation today patient appears to be doing much better in regard to his bilateral lower extremities. In fact his left lower extremity which was the largest is actually 15 cm smaller today  compared to what it was last time he was here in our clinic. This is obviously good news after just one week. Nonetheless the differences he actually kept the wraps on during the entire week this time. That's not typical for him. I do believe he understands a little bit better now the severity of the situation and why it's important for him to keep these wraps on. 07/30/18 on evaluation today patient actually appears to be doing rather well in regard to his lower extremities. His legs are much smaller than they have been in the past and he actually has only one very small rudder superficial region remaining that is not closed on the left lateral/posterior lower extremity even this is almost completely close. I do believe likely next week he will be healed without any complications. I do think we need to continue the wraps however this seems to be beneficial for him. I also think it may be a good time for [place] to go ahead and see about getting the appointment with the lymphedema clinic which is supposed to be made for him in order to keep this moving along and hopefully get them into compression wraps that will in the end help him to remain healed. 08/06/18 on evaluation today patient actually appears to be doing very well in regard as bilateral lower extremities. In fact his wounds appear to  be completely healed at this time. 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 He is in the process of getting appointment with the lymphedema clinic we have made this referral were just waiting to hear back on the schedule time. We need to follow up on that today as well. 08/13/18 on evaluation today patient actually appears to be doing very well in regard to his bilateral lower extremities there are no open wounds at this point. We are gonna go ahead and see about ordering the Velcro compression wraps for him had a discussion with them about [place] doing it versus the [place] they feel like they can definitely afford going ahead and get the wraps themselves and they would prefer to try to avoid having to go to the lymphedema clinic if it all possible which I completely understand. As long as he has good compression I'm okay either way. 3/17//20 on evaluation today patient actually appears to be doing well in regard to his bilateral lower extremity ulcers. He has been tolerating the dressing changes without complication specifically the compression wraps. Overall is had no issues my fingers finding that I see at this point is that he is having trouble with constipation. He tells me he has not been able to go to the bathroom for about six days. He's taken to over-the- counter oral laxatives unfortunately this is not helping. He has not contacted his doctor. Adam Merritt, Adam Merritt ([id number]) 08/27/18 on evaluation today patient appears to be doing fairly well in regard to his lower extremities at this point. There does not appear to be any new altars is swelling is very well controlled. We are still waiting for his Velcro compression wraps to arrive that should be sometime in the next week is Artie sent the check for these. 09/03/18 on evaluation today patient appears to be doing excellent in regard to his bilateral lower extremities which he shows no signs of  wound openings in regard to this point. He does have his Velcro compression wraps which did arrive in the mail since I saw him last  week. Overall he is doing excellent in my pinion. 09/13/18 on evaluation today patient appears to be doing very well currently in regard to the overall appearance of his bilateral lower extremities although he's a little bit more swollen than last time we saw him. At that point he been discharged without any open wounds. Nonetheless he has a small open wound on the posterior left lower extremity with some evidence of cellulitis noted as well. Fortunately I feel like he has made good progress overall with regard to his lower extremities from were things used to be. 09/20/18 on evaluation today patient actually appears to be doing much better. The erythematous lower extremity is improving wound itself which is still open appears to be doing much better as far as but appearance as well as pain is concerned overall very pleased in this regard. There's no signs of active infection at this time. 09/27/18 on evaluation today patient's wounds on the lower extremity actually appear to be doing fairly well at this time which is good news. There is no evidence of active infection currently and again is just as left lower extremity were there any wounds at this point anyway. I believe they may be completely healed but again I'm not 100% sure based on evaluation today. I think one more week of observation would likely be a good idea. 10/04/18 on evaluation today patient actually appears to be doing excellent in regard to the left lower extremity which actually appears to be completely healed as of today. Unfortunately he's been having issues with his right lower extremity have a new wound that has opened. Fortunately there's no evidence of active infection at this time which is good news. 10/10/18 upon evaluation today patient actually appears to be doing a little bit worse with the new  open area on his right posterior lower extremity. He's been tolerating the dressing changes without complication. Right now we been using his compression wraps although I think we may need to switch back to actually performing bilateral compression wraps are in the clinic. No fevers, chills, nausea, or vomiting noted at this time. 10/17/18 on evaluation today patient actually appears to be doing quite well in regard to his bilateral lower extremity ulcers. He has been tolerating the reps without complication although he would prefer not to rewrap his legs as of today. Fortunately there's no signs of active infection which is good news. No fevers, chills, nausea, or vomiting noted at this time. 10/24/18 on evaluation today patient appears to be doing very well in regard to his lower extremities. His right lower extremity is shown signs of healing and his left lower Trinity though not healed appears to be improving which is excellent news. Overall very pleased with how things seem to be progressing at this time. The patient is likewise happy to hear this. His Velcro compression wraps however have not been put on properly will gonna show his wife how to do that properly today. 10/31/18 on evaluation today patient appears to be doing more poorly in regard to his left lower extremity in particular although both lower extremities actually are showing some signs of being worse than my previous evaluation. Unfortunately I'm just not sure that his compression stockings even with the use of the compression/lymphedema pumps seem to be controlling this well. Upon further questioning he tells me that he also is not able to lie flat in the bed due to his congestive heart failure and difficulty breathing. For that reason he sleeps in  his recliner. To make matters worse his recliner also cannot even hold his legs up so instead of even being somewhat elevated they pretty much hang down to the floor. This is the way he  sleeps each night which is definitely counterproductive to everything else that were attempting to do from the standpoint of controlling his fluid. Nonetheless I think that he potentially could benefit from a hospital bed although this would be something that his primary care provider would likely have to order since anything that is order on our side has to be directly related to wound care and again the hospital bed is not necessarily a direct relation to although I think it does contribute to his overall wound status on his lower extremities. 11/07/18 on evaluation today patient appears to be doing better in regard to his bilateral lower extremity. He's been tolerating the dressing changes without complication. We did in the interim since I last saw him switch to just using extras orbiting new alginate and that seems to have done much better for him. I'm very pleased with the overall progress that is made. 11/14/18 on evaluation today patient appears to be redoing rather well in regard to his left lower extremity ulcers which are the only ones remaining at this point. Fortunately there's no signs of active infection at this time which is good news. Overall been very pleased with how things seem to be progressing currently. No fevers, chills, nausea, or vomiting noted at this time. 11/20/17 on evaluation today patient actually appears to be doing quite well in regard to his lower extremities on the left on the right he has several blisters that showed up although there's some question about whether or not he has had his broker compression wraps on like he was supposed to or not. Fortunately there's no signs of active infection at this time which is good news. Unfortunately though he is doing well from the standpoint of the left leg the right leg is not doing as well again this is when we did not wrap last week. 11/28/18 on evaluation today patient appears to be doing well in regard to his bilateral lower  extremities is left appears to be healed is right is not healed but is very close to being so. Overall very pleased with how things seem to be progressing. Patient is likewise happy that things are doing well. 12/09/18 on evaluation today patient actually appears to be completely healed which is excellent news. He actually seems to be doing well in regard to the swelling of the bilateral lower extremities which is also great news. Overall very pleased with how things seem to be progressing. Readmission: 03/18/2019 upon evaluation today patient presents for reevaluation concerning issues that he is having with his left lower extremity where he does have a wound noted upon inspection today. He also has a wound on the right second toe on the tip where it is apparently been rubbing in his shoe I did look at issues and you can actually see where this has been occurring as well. Fortunately there is no signs of infection with regard to the toe necessarily although it is very swollen compared to normal that does have me somewhat concerned about the possibility of further evaluation for infection/osteomyelitis. I would recommend an x-ray to start with. Otherwise he states that it is been 1-2 weeks that he has had the draining in regard to left lower extremity he does not know how this happened he has been wearing  his compression appropriately and I do see that as well today but nonetheless I do think that he needs to continue to be very cautious with regard to elevation as well. Adam Merritt, Adam Merritt ([id number]) 03/25/2019 on evaluation today patient appears to be doing much better in regard to his lower extremity at this time. That is on the left. He did culture positive for Pseudomonas but the good news is he seems to be doing much better the gentamicin we applied topically seems to have done a great job for him. There is no signs of active infection at this time systemically and locally he is doing much better.  No fevers, chills, nausea, vomiting, or diarrhea. In regard to his right toe this seems to be doing much better there is very little pressure noted at this point I did clean up some of the callus today around the edges of the wound as well as the surface of the wound but to be honest he is progressing quite nicely. 10/30; the area on the left anterior lower tibia area is healed over. His edema control is adequate even though his use of the compression pumps seems very intermittent. He has a juxta lite stocking on the right leg and a think there is 1 for the left leg at home. His wife is putting these on. He has an area on the plantar right second toe which is a hammertoe they are trying to offload this 04/08/2019 on evaluation today patient appears to be doing well in regard to his lower extremities which is good news. We are seeing him today for his toe ulcer which is giving him a little bit more trouble but again still seems to be doing better at this point which is good news. He is going require some sharp debridement in regard to the toe today however. 04/15/2019 on evaluation today patient actually appears to be doing quite well with regard to his toe ulcer. I am very pleased with how things are doing in that regard. With regard to his lower extremity edema in general he tells me he is not been using the lymphedema pumps regularly although he does not use them. I think that he needs to use them more regularly he does have a small blister on the left lower extremity this is not open at this point but obviously it something that can get worse if he does not keep the compression going and the pumps going as well. 04/22/2019 on evaluation today patient appears to be doing well with regard to his toe ulcer. He has been tolerating the dressing changes without complication. This is measuring slightly smaller this week as compared to last week. Fortunately there is no signs of active infection at this  time. 05/06/2019 on evaluation today patient actually appears to be doing excellent. In fact his toe appears to be completely healed which is great news. There is no signs of active infection at this time. Readmission: Patient presents today for follow-up after having been discharged at the beginning of December 2020. He states that in recent weeks things have reopened and has been having more trouble at this point. With that being said fortunately there is no signs of active infection at this time. No fever chills noted. Nonetheless he also does have an issue with one of his toes as well that seems to be somewhat this is on the right foot second toe. 07/25/2019 upon evaluation today patient's lower extremities appear to be doing excellent bilaterally. I  really feel like he is showing signs of great improvement and in fact I think he is close to healing which is great news. There is no signs of active infection at this time. No fevers, chills, nausea, vomiting, or diarrhea. 07/31/2019 upon evaluation today patient appears to be doing excellent and in fact I think may be completely healed in regard to his right lower extremity that were still to monitor this 1 more week before closing it out. The left lower extremity is measuring smaller although not completely closed seems to be doing excellent. 08/07/2019 upon evaluation today patient appears to be doing excellent in regard to his wounds. In fact the right lower extremity is completely healed there is no issues here. The left lower extremity has 1 very small area still remaining fortunately there is no signs of infection and overall I think he is very close to closure of the here as well. 08/14/19 upon evaluation today patient appears to be doing excellent in regard to his lower extremities. In fact he appears to be completely healed as of today. Fortunately there's no signs of active infection at this time. No fevers, chills, nausea, or vomiting noted  at this time. READMISSION 09/26/2019 This is a 75 year old man who is well-known to this clinic having just been discharged on 08/14/2019. He has known lymphedema and chronic venous insufficiency. He has Farrow wrap stockings and also external compression pumps. He does not use the external compression pumps however. He does however apparently fairly faithfully use the stockings. They have not been lotion in his legs. He has developed new wounds on the right anterior as well as the left medial left lateral and left posterior calf. He returns for our review of this Past medical history includes coronary artery disease, hypertension, congestive heart failure, COPD, venous insufficiency with lymphedema, type 2 diabetes. He apparently has compression pumps, Farrow wraps and at 1 point a hospital bed although I believe he sleeps in a recliner 10/03/2019 upon evaluation today patient appears to be doing better with regard to his wounds in general. He has been tolerating the dressing changes without complication. Fortunately there is no signs of active infection. No fevers, chills, nausea, vomiting, or diarrhea. I do believe the compression wraps are helping as they always have in the past. 10/10/2019 upon evaluation today patient appears to be doing excellent at this point. His right lower extremity is measuring much better and in fact is completely healed. His left lower extremity is also measuring better though not healed seems to be doing excellent at this point which is great news. Overall I am very pleased with how things appear currently. 10/27/2019 upon evaluation today patient appears to be doing quite well with regard to his wounds currently. He has been tolerating the dressing changes without complication. Fortunately there is no signs of active infection at this time. In fact he is almost completely healed on the left and has just a very small area open and on the right he is completely  healed. 11/04/2019 upon evaluation today patient appears to be doing quite well with regard to his wounds. In fact he appears to be quite possibly completely healed on the left lower extremity now as well the right lower extremity is still doing well. With that being said unfortunately though this may be healed I think it is a very fragile area and I cannot even confirm there is not a very small opening still remaining. I really think he may benefit from 1  additional weeks wrapping before discontinuing. 11/10/2019 upon evaluation today patient appears to be doing well in regard to his original wounds in fact everything that we were treating last week is completely healed on the left. Unfortunately he has a new skin tear just above where the wrap slipped down due to a fall he sustained causing this injury. 11/17/2019 on evaluation today patient appears to be doing better with regard to his wound. He has been tolerating the dressing changes without Adam Merritt, Adam Merritt. ([id number]) complication. Fortunately there is no signs of active infection at this time. No fevers, chills, nausea, vomiting, or diarrhea. 11/24/2019 upon evaluation today patient appears to be doing well with regard to his wound. This is making good progress there does not appear to be signs of active infection but overall I do feel like he is headed in the correct direction. Overall he is tolerating the compression wrap as well. 12/02/2019 upon evaluation today patient appears to be doing well with regard to his leg ulcer. In fact this appears to be completely healed and this is the last of the wounds that he had but still the left anterior lower extremity. Fortunately there is no signs of active infection at this time. No fevers, chills, nausea, vomiting, or diarrhea. Readmission: 02/05/2020 on evaluation today patient appears to be doing well with regard to his wound all things considered. He actually tells me that a couple weeks ago he had  trouble with his wraps and therefore took him off for a couple of days in order to give his legs a break. It was during this time that he actually developed increased swelling and edema of his legs and blisters on both legs. That is when he called to make the appointment. Nonetheless since that time he began wearing his compression wraps which are Velcro in the meantime and has done extremely well with this. In fact his leg appears to be under good control as far as edema is concerned today it is nothing like what I am seeing when he has come in for evaluations in the past. They have been using some of the silver alginate at home which has been beneficial for him. Fortunately there is no signs of active infection at this time. No fevers, chills, nausea, vomiting, or diarrhea. 02/19/2020 on evaluation today patient unfortunately does not appear to be doing quite as well as I would like to see. He has no signs of active infection at this time but unfortunately he is not doing well in regard to the overall appearance of his left leg. He has a couple other areas that are weeping now and the leg is much more swollen. I think that he needs to actually have a compression wrap to try to manage this. 02/26/2020 on evaluation today patient appears to be doing well with regard to his left lower extremity ulcer. Fortunately the swelling is down I do believe the pressure wrap seems to be doing quite well. Fortunately there is no signs of active infection at this time. 03/05/2020 upon evaluation today patient appears to be doing excellent in regard to his legs at this point. Fortunately there is no signs of active infection which is great news. Overall he feels like he is completely healed which is great news. Readmission: 05/20/2020 upon evaluation today patient appears to be doing well with regard to his leg ulcer all things considered. He is actually coming back to see [place] after having had a reopening of the ulcers  on  his left leg in the past several weeks. He is out of dressing supplies and his wife is been try to take care of this as best she can. 06/10/2020 upon evaluation today patient unfortunately appears to be doing significantly worse today compared to when I last saw him. He and his wife both tell me that "there has been a lot going on". I did not actually go into detail about everything that has been happening but nonetheless he has not obviously been wearing his compression. He brings them with him today but they were not on. I am not even certain to be honest that he could get those on at this point. Nonetheless I do believe that he is going to require compression wrapping at some point shortly but right now we need to try to get what appears to be infection under control at this time. 06/28/2020 upon evaluation today patient appears to be doing well with regard to 06/28/2020 upon evaluation today patient appears to be doing well pretty well in regard to his left leg which is much better than previous. With that being said in regard to his right leg he does seem to be having some issues with a new wound after he sustained a fall. This fortunately is not too deep but does appear to be something we need to address. Neither deep which is good. 07/05/2020 on evaluation today patient appears to be doing well with regard to his wounds. In fact everything on the left leg appears to be pretty much healed on the right leg this is very close though not completely closed yet. Fortunately there is no evidence of active infection at this time. No fevers, chills, nausea, vomiting, or diarrhea. 07/13/2019 upon evaluation today patient appears to be making good progress which is great news. Overall I am extremely pleased with where things stand today. There does not appear to be any signs of active infection which is great news and overall his left leg appears healed still the right leg though not healed does seem to be  making good progress which is excellent news. He is having no pain. 07/19/2020 on evaluation today patient appears to be doing well with regard to his wound. He has been tolerating the dressing changes without complication. Fortunately there does not appear to be any signs of active infection at this time. No fevers, chills, nausea, vomiting, or diarrhea. Readmission: 08/20/2020 on evaluation today patient presents for reevaluation here in the clinic concerning issues that he has been having with his bilateral lower extremities. Unfortunately this is an ongoing reoccurring issue. He tells me that he is really not certain exactly what caused the issue although as I discussed this further with him it appears that he has been sitting for the most part and sleeping in his chair with his feet on the ground he is not really elevating at all in fact the chair does not even have a reclining function therefore it is basically just him sitting with his feet on the ground. I think this alone has probably led to his increased swelling he is also having more difficulty walking which means that he is pumping less blood flow through his legs anyway as far as the venous stasis is concerned. All in all I think this is leading to a downward spiral of him not being able to walk very well as his legs are so heavy, they begin to weep, and overall he just does not seem to be doing as well  as it was when I last saw him. 08/27/2020 upon evaluation today patient's legs actually are showing signs of improvement which is great news. There does not appear to be any evidence of infection which is also excellent news. I am extremely pleased with where things stand today. No fevers, chills, nausea, vomiting, or diarrhea. 09/03/20 upon evaluation today patient appears to be doing excellent in regard to his wounds currently. He has been tolerating the dressing changes without complication and overall I am extremely pleased with where  things stand today. No fevers, chills, nausea, vomiting, or diarrhea. Overall he is healed on the right leg although the left leg not being completely healed still is doing significantly better. 09/10/2020/8/22 upon evaluation today patient appears to be doing well with regard to his wounds. He has been tolerating the dressing changes without complication. Fortunately he appears to be completely healed. I am very pleased in that regard. As is the patient his right leg is also doing well he did get a new recliner which is helping him keep his feet off the ground which I think is helped he is down 2 cm on the left compared to last week so I think there is some definite improvement here. Adam Merritt, TOOKER ([id number]) Electronic Signature(s) Signed: 09/10/2020 2:30:22 PM By: Worthy Keeler PA-C Entered By: Worthy Keeler on 09/10/2020 14:30:22 Adam Merritt, Adam Merritt ([id number]) -------------------------------------------------------------------------------- Physical Exam Details Patient Name: Adam Merritt Date of Service: 09/10/2020 1:45 PM Medical Record Number: [id number] Patient Account Number: [id number] Date of Birth/Sex: [birth year] (75 y.o. M) Treating RN: Carlene Coria Primary Care Provider: [name] Other Clinician: Jeanine Luz Referring Provider: [name] Treating Provider/Extender: Jeri Cos [name]: 3 Constitutional Well-nourished and well-hydrated in no acute distress. Respiratory normal breathing without difficulty. Psychiatric this patient is able to make decisions and demonstrates good insight into disease process. Alert and Oriented x 3. pleasant and cooperative. Notes Patient's wound bed showed signs of good granulation epithelization at this point. There does not appear to be any signs of active infection which is great news and overall very pleased with where things stand today. No fevers, chills, nausea, vomiting, or diarrhea. Electronic Signature(s) Signed:  09/10/2020 2:30:37 PM By: Worthy Keeler PA-C Entered By: Worthy Keeler on 09/10/2020 14:30:36 Adam Merritt ([id number]) -------------------------------------------------------------------------------- Physician Orders Details Patient Name: Adam Merritt Date of Service: 09/10/2020 1:45 PM Medical Record Number: [id number] Patient Account Number: [id number] Date of Birth/Sex: [birth year] (75 y.o. M) Treating RN: Carlene Coria Primary Care Provider: [name] Other Clinician: Jeanine Luz Referring Provider: [name] Treating Provider/Extender: Skipper Cliche in Treatment: 3 Verbal / Phone Orders: No Diagnosis Coding ICD-10 Coding Code Description I89.0 Lymphedema, not elsewhere classified I87.2 Venous insufficiency (chronic) (peripheral) Discharge From [facility] o Discharge from [facility] o Wear compression garments daily. Put garments on first thing when you wake up and remove them before bed. o Elevate, Exercise Daily and Avoid Standing for Long Periods of Time. Electronic Signature(s) Signed: 09/10/2020 5:24:00 PM By: Worthy Keeler PA-C Signed: 09/13/2020 7:55:29 AM By: Carlene Coria RN Entered By: Carlene Coria on 09/10/2020 14:24:41 Adam Merritt ([id number]) -------------------------------------------------------------------------------- Problem List Details Patient Name: Adam Merritt Date of Service: 09/10/2020 1:45 PM Medical Record Number: [id number] Patient Account Number: [id number] Date of Birth/Sex: 1946-02-09 (75 y.o. M) Treating RN: Carlene Coria Primary Care Provider: [name] Other Clinician: Jeanine Luz Referring Provider: [name] Treating Provider/Extender: Jeri Cos [name]:  3 Active Problems ICD-10 Encounter Code Description Active Date MDM Diagnosis I89.0 Lymphedema, not elsewhere classified 08/20/2020 No Yes I87.2 Venous insufficiency (chronic) (peripheral) 08/20/2020 No  Yes Inactive Problems Resolved Problems Electronic Signature(s) Signed: 09/10/2020 1:44:03 PM By: Worthy Keeler PA-C Entered By: Worthy Keeler on 09/10/2020 13:44:03 Adam Merritt ([id number]) -------------------------------------------------------------------------------- Progress Note Details Patient Name: Adam Merritt Date of Service: 09/10/2020 1:45 PM Medical Record Number: [id number] Patient Account Number: [id number] Date of Birth/Sex: [birth year] (75 y.o. M) Treating RN: Carlene Coria Primary Care Provider: [name] Other Clinician: Jeanine Luz Referring Provider: [name] Treating Provider/Extender: Skipper Cliche in Treatment: 3 Subjective Chief Complaint Information obtained from Patient Bilateral LE Ulcers History of Present Illness (HPI) 10/18/17-He is here for initial evaluation of bilateral lower extremity ulcerations in the presence of venous insufficiency and lymphedema. He has been seen by vascular medicine in the past, Dr. Lucky Cowboy, last seen in 2016. He does have a history of abnormal ABIs, which is to be expected given his lymphedema and venous insufficiency. According to Epic, it appears that all attempts for arterial evaluation and/or angiography were not follow through with by patient. He does have a history of being seen in lymphedema clinic in 2018, stopped going approximately 6 months ago stating "it didn't do any good". He does not have lymphedema pumps, he does not have custom fit compression wrap/stockings. He is diabetic and his recent A1c last month was 7.6. He admits to chronic bilateral lower extremity pain, no change in pain since blister and ulceration development. He is currently being treated with Levaquin for bronchitis. He has home health and we will continue. 10/25/17-He is here in follow-up evaluation for bilateral lower extremity ulcerationssubtle he remains on Levaquin for bronchitis. Right lower extremity with no evidence of drainage  or ulceration, persistent left lower extremity ulceration. He states that home health has not been out since his appointment. He went to [facility] on Tuesday, studies revealed: RIGHT ABI 0.9, TBI 0.6 LEFT ABI 1.1, TBI 0.6 with triphasic flow bilaterally. We will continue his same treatment plan. He has been educated on compression therapy and need for elevation. He will benefit from lymphedema pumps 11/01/17-He is here in follow-up evaluation for left lower extremity ulcer. The right lower extremity remains healed. He has home health services, but they have not been out to see the patient for 2-3 weeks. He states it home health physical therapy changed his dressing yesterday after therapy; he placed Ace wrap compression. We are still waiting for lymphedema pumps, reordered d/t need for company change. 11/08/17-He is here in follow-up evaluation for left lower extremity ulcer. It is improved. Edema is significantly improved with compression therapy. We will continue with same treatment plan and he will follow-up next week. No word regarding lymphedema pumps 11/15/17-He is here in follow-up evaluation for left lower extremity ulcer. He is healed and will be discharged from wound care services. I have reached out to [facility] regarding his lymphedema pumps. They have been unable to reach the patient; the contact number they had with the patient's wife's cell phone and she has not answered any unrecognized calls. Contact should be made today, trial planned for next week; [facility] will continue to follow 11/27/17 on evaluation today patient has multiple blistered areas over the right lower extremity his left lower extremity appears to be doing okay. These blistered areas show signs of no infection which is great news. With that being said he did have  some necrotic skin overlying which was mechanically debrided away with saline and gauze today without complication. Overall post  debridement the wounds appear to be doing better but in general his swelling seems to be increased. This is obviously not good news. I think this is what has given rise to the blisters. 12/04/17 on evaluation today patient presents for follow-up concerning his bilateral lower extremity edema in the right lower extremity ulcers. He has been tolerating the dressing changes without complication. With that being said he has had no real issues with the wraps which is also good news. Overall I'm pleased with the progress he's been making. 12/11/17 on evaluation today patient appears to be doing rather well in regard to his right lateral lower extremity ulcer. He's been tolerating the dressing changes without complication. Fortunately there does not appear to be any evidence of infection at this time. Overall I'm pleased with the progress that is being made. Unfortunately he has been in the hospital due to having what sounds to be a stomach virus/flu fortunately that is starting to get better. 12/18/17 on evaluation today patient actually appears to be doing very well in regard to his bilateral lower extremities the swelling is under fairly good control his lymphedema pumps are still not up and running quite yet. With that being said he does have several areas of opening noted as far as wounds are concerned mainly over the left lower extremity. With that being said I do believe once he gets lymphedema pumps this would least help you mention some the fluid and preventing this from occurring. Hopefully that will be set up soon sleeves are Artie in place at his home he just waiting for the machine. 12/25/17 on evaluation today patient actually appears to be doing excellent in fact all of his ulcers appear to have resolved his legs appear very well. I do think he needs compression stockings we have discussed this and they are actually going to go to [place] today to elastic therapy to get this fitted for him. I  think that is definitely a good thing to do. Readmission: 04/09/18 upon evaluation today this patient is seen for readmission due to bilateral lower extremity lymphedema. He has significant swelling of his extremities especially on the left although the right is also swollen he has weeping from both sides. There are no obvious open wounds at this point. Fortunately he has been doing fairly well for quite a bit of time since I last saw him. Nonetheless unfortunately this seems to have reopened and is giving quite a bit of trouble. He states this began about a week ago when he first called [place] to get in to be seen. No fevers, chills, nausea, or vomiting noted at this time. He has not been using his lymphedema pumps due to the fact that they won't fit on his leg at this point likewise is also not been using his compression for essentially the same reason. 04/16/18 upon evaluation today patient actually appears to be doing a little better in regard to the fluid in his bilateral lower extremities. With that being said he's had three falls since I saw him last week. He also states that he's been feeling very poorly. I was concerned last week and feel like that the concern is still there as far as the congestion in his chest is concerned he seems to be breathing about the same as last week but again he states he's very weak he's not even able to walk  further than from the chair to the door. His wife had to buy a wheelchair just to be able to get them out of the house to get to the appointment today. This has me very concerned. Adam Merritt, Adam Merritt ([id number]) 04/23/18 on evaluation today patient actually appears to be doing much better than last week's evaluation. At that time actually had to transport him to the ER via EMS and he subsequently was admitted for acute pulmonary edema, acute renal failure, and acute congestive heart failure. Fortunately he is doing much better. Apparently they did dialyze him and  were able to take off roughly 35 pounds of fluid. Nonetheless he is feeling much better both in regard to his breathing and he's able to get around much better at this time compared to previous. Overall I'm very happy with how things are at this time. There does not appear to be any evidence of infection currently. No fevers, chills, nausea, or vomiting noted at this time. 04/30/2018 patient seen today for follow-up and management of bilateral lower extremity lymphedema. He did express being more sad today than usual due to the recent loss of his dog. He states that he has been compliant with using the lymphedema pumps. However he does admit a minute over the last 2-3 days he has not been using the pumps due to the recent loss of his dog. At this time there is no drainage or open wounds to his lower extremities. The left leg edema is measuring smaller today. Still has a significant amount of edema on bilateral lower extremities With dry flaky skin. He will be referred to the lymphedema clinic for further management. Will continue 3 layer compression wraps and follow-up in 1-2 weeks.Denies any pain, fever, chairs recently. No recent falls or injuries reported during this visit. 05/07/18 on evaluation today patient actually appears to be doing very well in regard to his lower extremities in general all things considered. With that being said he is having some pain in the legs just due to the amount of swelling. He does have an area where he had a blister on the left lateral lower extremity this is open at this point other than that there's nothing else weeping at this time. 05/14/18 on evaluation today patient actually appears to be doing excellent all things considered in regard to his lower extremities. He still has a couple areas of weeping on each leg which has continued to be the issue for him. He does have an appointment with the lymphedema clinic although this isn't until February 2020. That was  the earliest they had. In the meantime he has continued to tolerate the compression wraps without complication. 05/28/18 on evaluation today patient actually appears to be doing more poorly in regard to his left lower extremity where he has a wound open at this point. He also had a fall where he subsequently injured his right great toe which has led to an open wound at the site unfortunately. He has been tolerating the dressing changes without complication in general as far as the wraps are concerned that he has not been putting any dressing on the left 1st toe ulcer site. 06/11/18 on evaluation today patient appears to be doing much worse in regard to his bilateral lower extremity ulcers. He has been tolerating the dressing changes without complication although his legs have not been wrapped more recently. Overall I am not very pleased with the way his legs appear. I do believe he needs to be back  in compression wraps he still has not received his compression wraps from the [facility] hospital as of yet. 06/18/18 on evaluation today patient actually appears to be doing significantly better than last time I saw him. He has been tolerating the compression wraps without complication in the circumferential ulcers especially appear to be doing much better. His toe ulcer on the right in regard to the great toe is better although not as good as the legs in my pinion. No fevers chills noted 07/02/18 on evaluation today patient appears to be doing much better in regard to his lower extremity ulcers. Unfortunately since I last saw him he's had the distal portion of his right great toe if he dated it sounds as if this actually went downhill very quickly. I had only seen him a few days prior and the toe did not appear to be infected at that point subsequently became infected very rapidly and it was decided by the surgeon that the distal portion of the toe needed to be removed. The patient seems to be doing well in this  regard he tells me. With that being said his lower extremities are doing better from the standpoint of the wounds although he is significantly swollen at this point. 07/09/18 on evaluation today patient appears to be doing better in regard to the wounds on his lower extremities. In fact everything is almost completely healed he is just a small area on the left posterior lower extremity that is open at this point. 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 With that being said he needs some kind of compression it does sound like he may not be wearing his compression, that is the wraps, during the entire time between when he's here visit to visit. Apparently his wife took the current one off because it began to "fall apart". 07/16/18 on evaluation today patient appears to be extremely swollen especially in regard to his left lower extremity unfortunately. He also has a new skin tear over the left lower extremity and there's a smaller area on the right lower extremity as well. Unfortunately this seems to be due in part to blistering and fluid buildup in his leg. He did get the reduction wraps that were ordered by the [facility] for him to go to lymphedema clinic. With that being said his wounds on the legs have not healed to the point to where they would likely accept them as a patient lymphedema clinic currently. We need to try to get this to heal. With that being said he's been taking his wraps off which is not doing him any favors at this point. In fact this is probably quite counterproductive compared to what needs to occur. We will likely need to increase to a four layer compression wrap and continue to also utilize elevation and he has to keep the wraps on not take them off as he's been doing currently hasn't had a wrap  on since Saturday. 07/23/18 on evaluation today patient appears to be doing much better in regard to his bilateral lower extremities. In fact his left lower extremity which was the largest is actually 15 cm smaller today compared to what it was last time he was here in our clinic. This is obviously good news after just one week. Nonetheless the differences he actually kept the wraps  on during the entire week this time. That's not typical for him. I do believe he understands a little bit better now the severity of the situation and why it's important for him to keep these wraps on. 07/30/18 on evaluation today patient actually appears to be doing rather well in regard to his lower extremities. His legs are much smaller than they have been in the past and he actually has only one very small rudder superficial region remaining that is not closed on the left lateral/posterior lower extremity even this is almost completely close. I do believe likely next week he will be healed without any complications. I do think we need to continue the wraps however this seems to be beneficial for him. I also think it may be a good time for [place] to go ahead and see about getting the appointment with the lymphedema clinic which is supposed to be made for him in order to keep this moving along and hopefully get them into compression wraps that will in the end help him to remain healed. 08/06/18 on evaluation today patient actually appears to be doing very well in regard as bilateral lower extremities. In fact his wounds appear to be completely healed at this time. 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 He is in the process of getting appointment with the lymphedema clinic we have made this referral were just waiting to hear back on the schedule time. We need to follow up on that today as well. 08/13/18 on evaluation today patient actually appears to be doing very  well in regard to his bilateral lower extremities there are no open wounds at this point. We are gonna go ahead and see about ordering the Velcro compression wraps for him had a discussion with them about [place] doing it versus the [place] they feel like they can definitely afford going ahead and get the wraps themselves and they would prefer to try to avoid having to go to the lymphedema clinic if it all possible which I completely understand. As long as he has good compression I'm okay either way. Adam Merritt, Adam Merritt ([id number]) 3/17//20 on evaluation today patient actually appears to be doing well in regard to his bilateral lower extremity ulcers. He has been tolerating the dressing changes without complication specifically the compression wraps. Overall is had no issues my fingers finding that I see at this point is that he is having trouble with constipation. He tells me he has not been able to go to the bathroom for about six days. He's taken to over-the- counter oral laxatives unfortunately this is not helping. He has not contacted his doctor. 08/27/18 on evaluation today patient appears to be doing fairly well in regard to his lower extremities at this point. There does not appear to be any new altars is swelling is very well controlled. We are still waiting for his Velcro compression wraps to arrive that should be sometime in the next week is Artie sent the check for these. 09/03/18 on evaluation today patient appears to be doing excellent in regard to his bilateral lower extremities which he shows no signs of wound openings in regard to this point. He does have his Velcro compression wraps which did arrive in the mail since I saw him last week. Overall he is doing excellent in my pinion. 09/13/18 on evaluation today patient appears to be doing very well currently in regard to the overall appearance of his bilateral lower  extremities although he's a little bit more swollen than last time we saw him. At that  point he been discharged without any open wounds. Nonetheless he has a small open wound on the posterior left lower extremity with some evidence of cellulitis noted as well. Fortunately I feel like he has made good progress overall with regard to his lower extremities from were things used to be. 09/20/18 on evaluation today patient actually appears to be doing much better. The erythematous lower extremity is improving wound itself which is still open appears to be doing much better as far as but appearance as well as pain is concerned overall very pleased in this regard. There's no signs of active infection at this time. 09/27/18 on evaluation today patient's wounds on the lower extremity actually appear to be doing fairly well at this time which is good news. There is no evidence of active infection currently and again is just as left lower extremity were there any wounds at this point anyway. I believe they may be completely healed but again I'm not 100% sure based on evaluation today. I think one more week of observation would likely be a good idea. 10/04/18 on evaluation today patient actually appears to be doing excellent in regard to the left lower extremity which actually appears to be completely healed as of today. Unfortunately he's been having issues with his right lower extremity have a new wound that has opened. Fortunately there's no evidence of active infection at this time which is good news. 10/10/18 upon evaluation today patient actually appears to be doing a little bit worse with the new open area on his right posterior lower extremity. He's been tolerating the dressing changes without complication. Right now we been using his compression wraps although I think we may need to switch back to actually performing bilateral compression wraps are in the clinic. No fevers, chills, nausea, or vomiting noted at this time. 10/17/18 on evaluation today patient actually appears to be doing quite  well in regard to his bilateral lower extremity ulcers. He has been tolerating the reps without complication although he would prefer not to rewrap his legs as of today. Fortunately there's no signs of active infection which is good news. No fevers, chills, nausea, or vomiting noted at this time. 10/24/18 on evaluation today patient appears to be doing very well in regard to his lower extremities. His right lower extremity is shown signs of healing and his left lower Trinity though not healed appears to be improving which is excellent news. Overall very pleased with how things seem to be progressing at this time. The patient is likewise happy to hear this. His Velcro compression wraps however have not been put on properly will gonna show his wife how to do that properly today. 10/31/18 on evaluation today patient appears to be doing more poorly in regard to his left lower extremity in particular although both lower extremities actually are showing some signs of being worse than my previous evaluation. Unfortunately I'm just not sure that his compression stockings even with the use of the compression/lymphedema pumps seem to be controlling this well. Upon further questioning he tells me that he also is not able to lie flat in the bed due to his congestive heart failure and difficulty breathing. For that reason he sleeps in his recliner. To make matters worse his recliner also cannot even hold his legs up so instead of even being somewhat elevated they pretty much hang down to the floor.  This is the way he sleeps each night which is definitely counterproductive to everything else that were attempting to do from the standpoint of controlling his fluid. Nonetheless I think that he potentially could benefit from a hospital bed although this would be something that his primary care provider would likely have to order since anything that is order on our side has to be directly related to wound care and again  the hospital bed is not necessarily a direct relation to although I think it does contribute to his overall wound status on his lower extremities. 11/07/18 on evaluation today patient appears to be doing better in regard to his bilateral lower extremity. He's been tolerating the dressing changes without complication. We did in the interim since I last saw him switch to just using extras orbiting new alginate and that seems to have done much better for him. I'm very pleased with the overall progress that is made. 11/14/18 on evaluation today patient appears to be redoing rather well in regard to his left lower extremity ulcers which are the only ones remaining at this point. Fortunately there's no signs of active infection at this time which is good news. Overall been very pleased with how things seem to be progressing currently. No fevers, chills, nausea, or vomiting noted at this time. 11/20/17 on evaluation today patient actually appears to be doing quite well in regard to his lower extremities on the left on the right he has several blisters that showed up although there's some question about whether or not he has had his broker compression wraps on like he was supposed to or not. Fortunately there's no signs of active infection at this time which is good news. Unfortunately though he is doing well from the standpoint of the left leg the right leg is not doing as well again this is when we did not wrap last week. 11/28/18 on evaluation today patient appears to be doing well in regard to his bilateral lower extremities is left appears to be healed is right is not healed but is very close to being so. Overall very pleased with how things seem to be progressing. Patient is likewise happy that things are doing well. 12/09/18 on evaluation today patient actually appears to be completely healed which is excellent news. He actually seems to be doing well in regard to the swelling of the bilateral lower  extremities which is also great news. Overall very pleased with how things seem to be progressing. Readmission: 03/18/2019 upon evaluation today patient presents for reevaluation concerning issues that he is having with his left lower extremity where he does have a wound noted upon inspection today. He also has a wound on the right second toe on the tip where it is apparently been rubbing in his shoe I did look at issues and you can actually see where this has been occurring as well. Fortunately there is no signs of infection with regard to Adam Merritt, Adam Merritt. ([id number]) the toe necessarily although it is very swollen compared to normal that does have me somewhat concerned about the possibility of further evaluation for infection/osteomyelitis. I would recommend an x-ray to start with. Otherwise he states that it is been 1-2 weeks that he has had the draining in regard to left lower extremity he does not know how this happened he has been wearing his compression appropriately and I do see that as well today but nonetheless I do think that he needs to continue to be very cautious with  regard to elevation as well. 03/25/2019 on evaluation today patient appears to be doing much better in regard to his lower extremity at this time. That is on the left. He did culture positive for Pseudomonas but the good news is he seems to be doing much better the gentamicin we applied topically seems to have done a great job for him. There is no signs of active infection at this time systemically and locally he is doing much better. No fevers, chills, nausea, vomiting, or diarrhea. In regard to his right toe this seems to be doing much better there is very little pressure noted at this point I did clean up some of the callus today around the edges of the wound as well as the surface of the wound but to be honest he is progressing quite nicely. 10/30; the area on the left anterior lower tibia area is healed over. His edema  control is adequate even though his use of the compression pumps seems very intermittent. He has a juxta lite stocking on the right leg and a think there is 1 for the left leg at home. His wife is putting these on. He has an area on the plantar right second toe which is a hammertoe they are trying to offload this 04/08/2019 on evaluation today patient appears to be doing well in regard to his lower extremities which is good news. We are seeing him today for his toe ulcer which is giving him a little bit more trouble but again still seems to be doing better at this point which is good news. He is going require some sharp debridement in regard to the toe today however. 04/15/2019 on evaluation today patient actually appears to be doing quite well with regard to his toe ulcer. I am very pleased with how things are doing in that regard. With regard to his lower extremity edema in general he tells me he is not been using the lymphedema pumps regularly although he does not use them. I think that he needs to use them more regularly he does have a small blister on the left lower extremity this is not open at this point but obviously it something that can get worse if he does not keep the compression going and the pumps going as well. 04/22/2019 on evaluation today patient appears to be doing well with regard to his toe ulcer. He has been tolerating the dressing changes without complication. This is measuring slightly smaller this week as compared to last week. Fortunately there is no signs of active infection at this time. 05/06/2019 on evaluation today patient actually appears to be doing excellent. In fact his toe appears to be completely healed which is great news. There is no signs of active infection at this time. Readmission: Patient presents today for follow-up after having been discharged at the beginning of December 2020. He states that in recent weeks things have reopened and has been having more  trouble at this point. With that being said fortunately there is no signs of active infection at this time. No fever chills noted. Nonetheless he also does have an issue with one of his toes as well that seems to be somewhat this is on the right foot second toe. 07/25/2019 upon evaluation today patient's lower extremities appear to be doing excellent bilaterally. I really feel like he is showing signs of great improvement and in fact I think he is close to healing which is great news. There is no signs of active  infection at this time. No fevers, chills, nausea, vomiting, or diarrhea. 07/31/2019 upon evaluation today patient appears to be doing excellent and in fact I think may be completely healed in regard to his right lower extremity that were still to monitor this 1 more week before closing it out. The left lower extremity is measuring smaller although not completely closed seems to be doing excellent. 08/07/2019 upon evaluation today patient appears to be doing excellent in regard to his wounds. In fact the right lower extremity is completely healed there is no issues here. The left lower extremity has 1 very small area still remaining fortunately there is no signs of infection and overall I think he is very close to closure of the here as well. 08/14/19 upon evaluation today patient appears to be doing excellent in regard to his lower extremities. In fact he appears to be completely healed as of today. Fortunately there's no signs of active infection at this time. No fevers, chills, nausea, or vomiting noted at this time. READMISSION 09/26/2019 This is a 75 year old man who is well-known to this clinic having just been discharged on 08/14/2019. He has known lymphedema and chronic venous insufficiency. He has [place] wrap stockings and also external compression pumps. He does not use the external compression pumps however. He does however apparently fairly faithfully use the stockings. They have not  been lotion in his legs. He has developed new wounds on the right anterior as well as the left medial left lateral and left posterior calf. He returns for our review of this Past medical history includes coronary artery disease, hypertension, congestive heart failure, COPD, venous insufficiency with lymphedema, type 2 diabetes. He apparently has compression pumps, [place] wraps and at 1 point a hospital bed although I believe he sleeps in a recliner 10/03/2019 upon evaluation today patient appears to be doing better with regard to his wounds in general. He has been tolerating the dressing changes without complication. Fortunately there is no signs of active infection. No fevers, chills, nausea, vomiting, or diarrhea. I do believe the compression wraps are helping as they always have in the past. 10/10/2019 upon evaluation today patient appears to be doing excellent at this point. His right lower extremity is measuring much better and in fact is completely healed. His left lower extremity is also measuring better though not healed seems to be doing excellent at this point which is great news. Overall I am very pleased with how things appear currently. 10/27/2019 upon evaluation today patient appears to be doing quite well with regard to his wounds currently. He has been tolerating the dressing changes without complication. Fortunately there is no signs of active infection at this time. In fact he is almost completely healed on the left and has just a very small area open and on the right he is completely healed. 11/04/2019 upon evaluation today patient appears to be doing quite well with regard to his wounds. In fact he appears to be quite possibly completely healed on the left lower extremity now as well the right lower extremity is still doing well. With that being said unfortunately though this may be healed I think it is a very fragile area and I cannot even confirm there is not a very small opening still  remaining. I really think he may benefit from 1 additional weeks wrapping before discontinuing. Adam Merritt, Adam Merritt ([id number]) 11/10/2019 upon evaluation today patient appears to be doing well in regard to his original wounds in fact everything that we  were treating last week is completely healed on the left. Unfortunately he has a new skin tear just above where the wrap slipped down due to a fall he sustained causing this injury. 11/17/2019 on evaluation today patient appears to be doing better with regard to his wound. He has been tolerating the dressing changes without complication. Fortunately there is no signs of active infection at this time. No fevers, chills, nausea, vomiting, or diarrhea. 11/24/2019 upon evaluation today patient appears to be doing well with regard to his wound. This is making good progress there does not appear to be signs of active infection but overall I do feel like he is headed in the correct direction. Overall he is tolerating the compression wrap as well. 12/02/2019 upon evaluation today patient appears to be doing well with regard to his leg ulcer. In fact this appears to be completely healed and this is the last of the wounds that he had but still the left anterior lower extremity. Fortunately there is no signs of active infection at this time. No fevers, chills, nausea, vomiting, or diarrhea. Readmission: 02/05/2020 on evaluation today patient appears to be doing well with regard to his wound all things considered. He actually tells me that a couple weeks ago he had trouble with his wraps and therefore took him off for a couple of days in order to give his legs a break. It was during this time that he actually developed increased swelling and edema of his legs and blisters on both legs. That is when he called to make the appointment. Nonetheless since that time he began wearing his compression wraps which are Velcro in the meantime and has done extremely well with this.  In fact his leg appears to be under good control as far as edema is concerned today it is nothing like what I am seeing when he has come in for evaluations in the past. They have been using some of the silver alginate at home which has been beneficial for him. Fortunately there is no signs of active infection at this time. No fevers, chills, nausea, vomiting, or diarrhea. 02/19/2020 on evaluation today patient unfortunately does not appear to be doing quite as well as I would like to see. He has no signs of active infection at this time but unfortunately he is not doing well in regard to the overall appearance of his left leg. He has a couple other areas that are weeping now and the leg is much more swollen. I think that he needs to actually have a compression wrap to try to manage this. 02/26/2020 on evaluation today patient appears to be doing well with regard to his left lower extremity ulcer. Fortunately the swelling is down I do believe the pressure wrap seems to be doing quite well. Fortunately there is no signs of active infection at this time. 03/05/2020 upon evaluation today patient appears to be doing excellent in regard to his legs at this point. Fortunately there is no signs of active infection which is great news. Overall he feels like he is completely healed which is great news. Readmission: 05/20/2020 upon evaluation today patient appears to be doing well with regard to his leg ulcer all things considered. He is actually coming back to see [place] after having had a reopening of the ulcers on his left leg in the past several weeks. He is out of dressing supplies and his wife is been try to take care of this as best she can. 06/10/2020 upon  evaluation today patient unfortunately appears to be doing significantly worse today compared to when I last saw him. He and his wife both tell me that "there has been a lot going on". I did not actually go into detail about everything that has been happening  but nonetheless he has not obviously been wearing his compression. He brings them with him today but they were not on. I am not even certain to be honest that he could get those on at this point. Nonetheless I do believe that he is going to require compression wrapping at some point shortly but right now we need to try to get what appears to be infection under control at this time. 06/28/2020 upon evaluation today patient appears to be doing well with regard to 06/28/2020 upon evaluation today patient appears to be doing well pretty well in regard to his left leg which is much better than previous. With that being said in regard to his right leg he does seem to be having some issues with a new wound after he sustained a fall. This fortunately is not too deep but does appear to be something we need to address. Neither deep which is good. 07/05/2020 on evaluation today patient appears to be doing well with regard to his wounds. In fact everything on the left leg appears to be pretty much healed on the right leg this is very close though not completely closed yet. Fortunately there is no evidence of active infection at this time. No fevers, chills, nausea, vomiting, or diarrhea. 07/13/2019 upon evaluation today patient appears to be making good progress which is great news. Overall I am extremely pleased with where things stand today. There does not appear to be any signs of active infection which is great news and overall his left leg appears healed still the right leg though not healed does seem to be making good progress which is excellent news. He is having no pain. 07/19/2020 on evaluation today patient appears to be doing well with regard to his wound. He has been tolerating the dressing changes without complication. Fortunately there does not appear to be any signs of active infection at this time. No fevers, chills, nausea, vomiting, or diarrhea. Readmission: 08/20/2020 on evaluation today patient  presents for reevaluation here in the clinic concerning issues that he has been having with his bilateral lower extremities. Unfortunately this is an ongoing reoccurring issue. He tells me that he is really not certain exactly what caused the issue although as I discussed this further with him it appears that he has been sitting for the most part and sleeping in his chair with his feet on the ground he is not really elevating at all in fact the chair does not even have a reclining function therefore it is basically just him sitting with his feet on the ground. I think this alone has probably led to his increased swelling he is also having more difficulty walking which means that he is pumping less blood flow through his legs anyway as far as the venous stasis is concerned. All in all I think this is leading to a downward spiral of him not being able to walk very well as his legs are so heavy, they begin to weep, and overall he just does not seem to be doing as well as it was when I last saw him. 08/27/2020 upon evaluation today patient's legs actually are showing signs of improvement which is great news. There does not appear to be  any evidence of infection which is also excellent news. I am extremely pleased with where things stand today. No fevers, chills, nausea, vomiting, or diarrhea. 09/03/20 upon evaluation today patient appears to be doing excellent in regard to his wounds currently. He has been tolerating the dressing changes without complication and overall I am extremely pleased with where things stand today. No fevers, chills, nausea, vomiting, or diarrhea. Overall he is healed on the right leg although the left leg not being completely healed still is doing significantly better. Adam Merritt, Adam Merritt ([id number]) 09/10/2020/8/22 upon evaluation today patient appears to be doing well with regard to his wounds. He has been tolerating the dressing changes without complication. Fortunately he appears to  be completely healed. I am very pleased in that regard. As is the patient his right leg is also doing well he did get a new recliner which is helping him keep his feet off the ground which I think is helped he is down 2 cm on the left compared to last week so I think there is some definite improvement here. Objective Constitutional Well-nourished and well-hydrated in no acute distress. Vitals Time Taken: 2:00 PM, Temperature: 98.1 F, Pulse: 72 bpm, Respiratory Rate: 18 breaths/min, Blood Pressure: 142/81 mmHg. Respiratory normal breathing without difficulty. Psychiatric this patient is able to make decisions and demonstrates good insight into disease process. Alert and Oriented x 3. pleasant and cooperative. General Notes: Patient's wound bed showed signs of good granulation epithelization at this point. There does not appear to be any signs of active infection which is great news and overall very pleased with where things stand today. No fevers, chills, nausea, vomiting, or diarrhea. Assessment Active Problems ICD-10 Lymphedema, not elsewhere classified Venous insufficiency (chronic) (peripheral) Plan Discharge From [facility]: Discharge from [facility] Complete Wear compression garments daily. Put garments on first thing when you wake up and remove them before bed. Elevate, Exercise Daily and Avoid Standing for Long Periods of Time. 1. Would recommend currently that we going continue with wound care measures as before and the patient is in agreement with the plan that includes the use of the compression wraps. They seem to do very well for him and I would recommend this such that we continue currently with the compression wraps. 2. I am also can recommend that we have the patient use lotion at night he should take his wraps off put lotion on. That means he is getting to put the wraps on first thing in the morning in order to keep edema under control but I think this  will help keep his legs much better shape. We will see the patient back for follow-up visit as needed. Electronic Signature(s) Signed: 09/10/2020 2:31:23 PM By: Worthy Keeler PA-C Entered By: Worthy Keeler on 09/10/2020 14:31:22 Adam Merritt ([id number]) -------------------------------------------------------------------------------- SuperBill Details Patient Name: Adam Merritt Date of Service: 09/10/2020 Medical Record Number: [id number] Patient Account Number: [id number] Date of Birth/Sex: 1945/09/01 (75 y.o. M) Treating RN: Carlene Coria Primary Care Provider: [name] Other Clinician: Jeanine Luz Referring Provider: [name] Treating Provider/Extender: Skipper Cliche in Treatment: 3 Diagnosis Coding ICD-10 Codes Code Description I89.0 Lymphedema, not elsewhere classified I87.2 Venous insufficiency (chronic) (peripheral) Facility Procedures CPT4 Code: [id number] Description: [phone] - WOUND CARE VISIT-LEV 2 EST PT Modifier: Quantity: 1 Physician Procedures CPT4 Code: [id number] Description: 20355 - WC PHYS LEVEL 3 - EST PT Modifier: Quantity: 1 CPT4 Code: Description: ICD-10 Diagnosis Description I89.0 Lymphedema, not elsewhere classified I87.2  Venous insufficiency (chronic) (peripheral) Modifier: Quantity: Electronic Signature(s) Signed: 09/10/2020 2:31:43 PM By: Worthy Keeler PA-C Entered By: Worthy Keeler on 09/10/2020 14:31:43

## 2020-09-13 NOTE — Progress Notes (Addendum)
BOBBYJOE, PABST ([id number]) Visit Report for 09/10/2020 Arrival Information Details Patient Name: Adam Merritt, Adam Merritt Date of Service: 09/10/2020 1:45 PM Medical Record Number: [id number] Patient Account Number: [id number] Date of Birth/Sex: 1945-08-22 (75 y.o. M) Treating RN: Donnamarie Poag Primary Care Williamson Cavanah: [name] Other Clinician: Jeanine Luz Referring Genelle Economou: [name] Treating Duane Trias/Extender: Skipper Cliche in Treatment: 3 Visit Information History Since Last Visit Added or deleted any medications: No Patient Arrived: Wheel Chair Had a fall or experienced change in No Arrival Time: 14:00 activities of daily living that may affect Accompanied By: wife risk of falls: Transfer Assistance: EasyPivot Patient Lift Hospitalized since last visit: No Patient Identification Verified: Yes Has Dressing in Place as Prescribed: Yes Secondary Verification Process Completed: Yes Has Compression in Place as Prescribed: Yes Pain Present Now: Yes Electronic Signature(s) Signed: 09/10/2020 4:43:09 PM By: Donnamarie Poag Entered By: Donnamarie Poag on 09/10/2020 14:00:57 Adam Merritt ([id number]) -------------------------------------------------------------------------------- Clinic Level of Care Assessment Details Patient Name: Adam Merritt Date of Service: 09/10/2020 1:45 PM Medical Record Number: [id number] Patient Account Number: [id number] Date of Birth/Sex: 06/12/1945 (75 y.o. M) Treating RN: Carlene Coria Primary Care Takiera Mayo: [name] Other Clinician: Jeanine Luz Referring Demetrios Byron: [name] Treating Javiel Canepa/Extender: Skipper Cliche in Treatment: 3 Clinic Level of Care Assessment Items TOOL 4 Quantity Score X - Use when only an EandM is performed on FOLLOW-UP visit 1 0 ASSESSMENTS - Nursing Assessment / Reassessment X - Reassessment of Co-morbidities (includes updates in patient status) 1 10 X- 1 5 Reassessment of Adherence to Treatment Plan ASSESSMENTS -  Wound and Skin Assessment / Reassessment []  - Simple Wound Assessment / Reassessment - one wound 0 []  - 0 Complex Wound Assessment / Reassessment - multiple wounds []  - 0 Dermatologic / Skin Assessment (not related to wound area) ASSESSMENTS - Focused Assessment []  - Circumferential Edema Measurements - multi extremities 0 []  - 0 Nutritional Assessment / Counseling / Intervention []  - 0 Lower Extremity Assessment (monofilament, tuning fork, pulses) []  - 0 Peripheral Arterial Disease Assessment (using hand held doppler) ASSESSMENTS - Ostomy and/or Continence Assessment and Care []  - Incontinence Assessment and Management 0 []  - 0 Ostomy Care Assessment and Management (repouching, etc.) PROCESS - Coordination of Care X - Simple Patient / Family Education for ongoing care 1 15 []  - 0 Complex (extensive) Patient / Family Education for ongoing care []  - 0 Staff obtains Programmer, systems, Records, Test Results / Process Orders []  - 0 Staff telephones HHA, Nursing Homes / Clarify orders / etc []  - 0 Routine Transfer to another Facility (non-emergent condition) []  - 0 Routine Hospital Admission (non-emergent condition) []  - 0 New Admissions / Biomedical engineer / Ordering NPWT, Apligraf, etc. []  - 0 Emergency Hospital Admission (emergent condition) X- 1 10 Simple Discharge Coordination []  - 0 Complex (extensive) Discharge Coordination PROCESS - Special Needs []  - Pediatric / Minor Patient Management 0 []  - 0 Isolation Patient Management []  - 0 Hearing / Language / Visual special needs []  - 0 Assessment of Community assistance (transportation, D/C planning, etc.) []  - 0 Additional assistance / Altered mentation []  - 0 Support Surface(s) Assessment (bed, cushion, seat, etc.) INTERVENTIONS - Wound Cleansing / Measurement Adam Merritt, Adam Merritt ([id number]) []  - 0 Simple Wound Cleansing - one wound []  - 0 Complex Wound Cleansing - multiple wounds []  - 0 Wound Imaging (photographs -  any number of wounds) []  - 0 Wound Tracing (instead of photographs) []  - 0 Simple Wound Measurement - one wound []  -  0 Complex Wound Measurement - multiple wounds INTERVENTIONS - Wound Dressings []  - Small Wound Dressing one or multiple wounds 0 []  - 0 Medium Wound Dressing one or multiple wounds []  - 0 Large Wound Dressing one or multiple wounds []  - 0 Application of Medications - topical []  - 0 Application of Medications - injection INTERVENTIONS - Miscellaneous []  - External ear exam 0 []  - 0 Specimen Collection (cultures, biopsies, blood, body fluids, etc.) []  - 0 Specimen(s) / Culture(s) sent or taken to Lab for analysis []  - 0 Patient Transfer (multiple staff / Civil Service fast streamer / Similar devices) []  - 0 Simple Staple / Suture removal (25 or less) []  - 0 Complex Staple / Suture removal (26 or more) []  - 0 Hypo / Hyperglycemic Management (close monitor of Blood Glucose) []  - 0 Ankle / Brachial Index (ABI) - do not check if billed separately X- 1 5 Vital Signs Has the patient been seen at the hospital within the last three years: Yes Total Score: 45 Level Of Care: New/Established - Level 2 Electronic Signature(s) Signed: 09/13/2020 7:55:29 AM By: Carlene Coria RN Entered By: Carlene Coria on 09/10/2020 14:25:56 Adam Merritt ([id number]) -------------------------------------------------------------------------------- Encounter Discharge Information Details Patient Name: Adam Merritt Date of Service: 09/10/2020 1:45 PM Medical Record Number: [id number] Patient Account Number: [id number] Date of Birth/Sex: Apr 12, 1946 (75 y.o. M) Treating RN: Carlene Coria Primary Care Dreamer Carillo: [name] Other Clinician: Jeanine Luz Referring Micca Matura: [name] Treating Makenlee Mckeag/Extender: Skipper Cliche in Treatment: 3 Encounter Discharge Information Items Discharge Condition: Stable Ambulatory Status: Wheelchair Discharge Destination: Home Transportation: Private  Auto Accompanied By: wife Schedule Follow-up Appointment: No Clinical Summary of Care: Electronic Signature(s) Signed: 09/14/2020 8:32:14 AM By: Jeanine Luz Entered By: Jeanine Luz on 09/10/2020 14:38:54 Adam Merritt ([id number]) -------------------------------------------------------------------------------- Lower Extremity Assessment Details Patient Name: Adam Merritt Date of Service: 09/10/2020 1:45 PM Medical Record Number: [id number] Patient Account Number: [id number] Date of Birth/Sex: [birth year] (75 y.o. M) Treating RN: Donnamarie Poag Primary Care Emanuelle Hammerstrom: [name] Other Clinician: Jeanine Luz Referring Liliann File: [name] Treating Eilene Voigt/Extender: Jeri Cos [name]: 3 Edema Assessment Assessed: [Left: Yes] [Right: Yes] Edema: [Left: Yes] [Right: Yes] Calf Left: Right: Point of Measurement: 31 cm From Medial Instep 50 cm Ankle Left: Right: Point of Measurement: 12 cm From Medial Instep 37.4 cm Vascular Assessment Pulses: Dorsalis Pedis Palpable: [Left:Yes] Electronic Signature(s) Signed: 09/10/2020 4:43:09 PM By: Donnamarie Poag Entered By: Donnamarie Poag on 09/10/2020 14:09:21 Adam Merritt ([id number]) -------------------------------------------------------------------------------- Multi Wound Chart Details Patient Name: Adam Merritt Date of Service: 09/10/2020 1:45 PM Medical Record Number: [id number] Patient Account Number: [id number] Date of Birth/Sex: 1945-11-29 (75 y.o. M) Treating RN: Carlene Coria Primary Care Yuliana Vandrunen: [name] Other Clinician: Jeanine Luz Referring Phelan Goers: [name] Treating Dora Clauss/Extender: Skipper Cliche in Treatment: 3 Vital Signs Height(in): Pulse(bpm): 72 Weight(lbs): Blood Pressure(mmHg): 142/81 Body Mass Index(BMI): Temperature(F): 98.1 Respiratory Rate(breaths/min): 18 Wound Assessments Treatment Notes Electronic Signature(s) Signed: 09/13/2020 7:55:29 AM By: Carlene Coria  RN Entered By: Carlene Coria on 09/10/2020 14:25:08 Adam Merritt ([id number]) -------------------------------------------------------------------------------- [place] Details Patient Name: Adam Merritt Date of Service: 09/10/2020 1:45 PM Medical Record Number: [id number] Patient Account Number: [id number] Date of Birth/Sex: 1946/05/12 (75 y.o. M) Treating RN: Carlene Coria Primary Care Tallia Moehring: [name] Other Clinician: Jeanine Luz Referring Felecia Stanfill: [name] Treating Taralynn Quiett/Extender: Jeri Cos [name]: 3 Active Inactive Electronic Signature(s) Signed: 09/13/2020 7:55:29 AM By: Carlene Coria RN Entered By: Carlene Coria on 09/10/2020  14:24:58 Adam Merritt, Adam Merritt ([id number]) -------------------------------------------------------------------------------- Pain Assessment Details Patient Name: Adam Merritt, Adam Merritt Date of Service: 09/10/2020 1:45 PM Medical Record Number: [id number] Patient Account Number: [id number] Date of Birth/Sex: [birth year] (75 y.o. M) Treating RN: Donnamarie Poag Primary Care Kayren Holck: [name] Other Clinician: Jeanine Luz Referring Feras Gardella: [name] Treating Adriana Quinby/Extender: Skipper Cliche in Treatment: 3 Active Problems Location of Pain Severity and Description of Pain Patient Has Paino No Site Locations Rate the pain. Current Pain Level: 1 Pain Management and Medication Current Pain Management: Electronic Signature(s) Signed: 09/10/2020 4:43:09 PM By: Donnamarie Poag Entered By: Donnamarie Poag on 09/10/2020 14:02:55 Adam Merritt ([id number]) -------------------------------------------------------------------------------- Patient/Caregiver Education Details Patient Name: Adam Merritt Date of Service: 09/10/2020 1:45 PM Medical Record Number: [id number] Patient Account Number: [id number] Date of Birth/Gender: [birth year] (75 y.o. M) Treating RN: Carlene Coria Primary Care Physician: [name] Other Clinician:  Jeanine Luz Referring Physician: [name] Treating Physician/Extender: Skipper Cliche in Treatment: 3 Education Assessment Education Provided To: Patient Education Topics Provided Wound/Skin Impairment: Methods: Explain/Verbal Responses: State content correctly Electronic Signature(s) Signed: 09/13/2020 7:55:29 AM By: Carlene Coria RN Entered By: Carlene Coria on 09/10/2020 14:26:14 Adam Merritt ([id number]) -------------------------------------------------------------------------------- [place] Details Patient Name: Adam Merritt Date of Service: 09/10/2020 1:45 PM Medical Record Number: [id number] Patient Account Number: [id number] Date of Birth/Sex: January 04, 1946 (75 y.o. M) Treating RN: Donnamarie Poag Primary Care Jeanise Durfey: [name] Other Clinician: Jeanine Luz Referring Chalon Zobrist: [name] Treating Galileo Colello/Extender: Jeri Cos [name]: 3 Vital Signs Time Taken: 14:00 Temperature (F): 98.1 Pulse (bpm): 72 Respiratory Rate (breaths/min): 18 Blood Pressure (mmHg): 142/81 Reference Range: 80 - 120 mg / dl Electronic Signature(s) Signed: 09/10/2020 4:43:09 PM By: Donnamarie Poag Entered ByDonnamarie Poag on 09/10/2020 14:02:40

## 2020-09-26 ENCOUNTER — Other Ambulatory Visit: Payer: Self-pay | Admitting: Nurse Practitioner

## 2020-09-26 DIAGNOSIS — I890 Lymphedema, not elsewhere classified: Secondary | ICD-10-CM

## 2020-09-27 ENCOUNTER — Other Ambulatory Visit: Payer: Self-pay | Admitting: Internal Medicine

## 2020-09-27 DIAGNOSIS — I890 Lymphedema, not elsewhere classified: Secondary | ICD-10-CM

## 2020-10-04 DIAGNOSIS — I509 Heart failure, unspecified: Secondary | ICD-10-CM | POA: Diagnosis not present

## 2020-10-18 ENCOUNTER — Ambulatory Visit: Payer: TRICARE For Life (TFL) | Admitting: Internal Medicine

## 2020-10-20 ENCOUNTER — Other Ambulatory Visit: Payer: Self-pay | Admitting: Internal Medicine

## 2020-10-20 DIAGNOSIS — E1165 Type 2 diabetes mellitus with hyperglycemia: Secondary | ICD-10-CM

## 2020-10-20 NOTE — Telephone Encounter (Signed)
Pt need appt for refills  ?

## 2020-11-02 ENCOUNTER — Other Ambulatory Visit: Payer: Self-pay

## 2020-11-02 ENCOUNTER — Ambulatory Visit (INDEPENDENT_AMBULATORY_CARE_PROVIDER_SITE_OTHER): Payer: Medicare PPO | Admitting: Internal Medicine

## 2020-11-02 ENCOUNTER — Encounter: Payer: Self-pay | Admitting: Internal Medicine

## 2020-11-02 VITALS — BP 130/62 | HR 63 | Temp 98.4°F | Resp 16 | Ht 65.0 in | Wt 300.0 lb

## 2020-11-02 DIAGNOSIS — I890 Lymphedema, not elsewhere classified: Secondary | ICD-10-CM | POA: Diagnosis not present

## 2020-11-02 DIAGNOSIS — J441 Chronic obstructive pulmonary disease with (acute) exacerbation: Secondary | ICD-10-CM

## 2020-11-02 DIAGNOSIS — G4734 Idiopathic sleep related nonobstructive alveolar hypoventilation: Secondary | ICD-10-CM | POA: Diagnosis not present

## 2020-11-02 DIAGNOSIS — G4733 Obstructive sleep apnea (adult) (pediatric): Secondary | ICD-10-CM

## 2020-11-02 DIAGNOSIS — R0602 Shortness of breath: Secondary | ICD-10-CM

## 2020-11-02 NOTE — Progress Notes (Signed)
[facility] [place], [place] [address]  Pulmonary Sleep Medicine   Office Visit Note  Patient Name: Adam Merritt DOB: Jan 13, 1946 MRN [id number]  Date of Service: 11/02/2020  Complaints/HPI: Patient is here for follow-up continues to have issues with shortness of breath cough congestion severe swelling of his legs.  He has not had any real worsening of his symptoms overall per se.  No admission to the hospital recently.  Denies having any chest pain no palpitations  ROS  General: (-) fever, (-) chills, (-) night sweats, (-) weakness Skin: (-) rashes, (-) itching,. Eyes: (-) visual changes, (-) redness, (-) itching. Nose and Sinuses: (-) nasal stuffiness or itchiness, (-) postnasal drip, (-) nosebleeds, (-) sinus trouble. Mouth and Throat: (-) sore throat, (-) hoarseness. Neck: (-) swollen glands, (-) enlarged thyroid, (-) neck pain. Respiratory: + cough, (-) bloody sputum, - shortness of breath, - wheezing. Cardiovascular: ++ ankle swelling, (-) chest pain. Lymphatic: (-) lymph node enlargement. Neurologic: (-) numbness, (-) tingling. Psychiatric: (-) anxiety, (-) depression   Current Medication: Outpatient Encounter Medications as of 11/02/2020  Medication Sig   albuterol (VENTOLIN HFA) 108 (90 Base) MCG/ACT inhaler Inhale 2 puffs into the lungs every 6 (six) hours as needed for wheezing or shortness of breath.   aspirin 325 MG EC tablet Take 325 mg by mouth daily.   atorvastatin (LIPITOR) 40 MG tablet Take 40 mg by mouth at bedtime.    azithromycin (ZITHROMAX) 250 MG tablet Take one tab po qd for 14 days   benzonatate (TESSALON) 100 MG capsule Take 1 capsule (100 mg total) by mouth 2 (two) times daily as needed for cough.   carvedilol (COREG) 25 MG tablet Take 25 mg by mouth 2 (two) times daily with a meal.   Cholecalciferol (VITAMIN D3 PO) Take by mouth.   cyclobenzaprine (FLEXERIL) 5 MG tablet TAKE 1 TABLET(5 MG) BY MOUTH TWICE DAILY AS NEEDED FOR  MUSCLE SPASMS   docusate sodium (COLACE) 50 MG capsule Take 50 mg by mouth 2 (two) times daily.   febuxostat (ULORIC) 40 MG tablet Take 1 tablet (40 mg total) by mouth daily.   fluticasone (FLOVENT HFA) 110 MCG/ACT inhaler Take 2 puffs in am and pm  for copd // chronic bronchitis ( rinse mouth)   furosemide (LASIX) 40 MG tablet Take 1 tablet (40 mg total) by mouth daily.   gabapentin (NEURONTIN) 300 MG capsule Take 2 cap in the morning, 1 cap in the evening and 2 cap at night.   glucose blood test strip Use as instructed to check blood sugars twice daily   hydrALAZINE (APRESOLINE) 25 MG tablet Take 25 mg by mouth 3 (three) times daily.    Insulin Glargine (BASAGLAR KWIKPEN) 100 UNIT/ML INJECT 30 UNITS TO 36 UNITS UNDER THE SKIN EVERY DAY   Insulin Pen Needle (BD PEN NEEDLE NANO U/F) 32G X 4 MM MISC Use as directed with insulin DX e11.65   ipratropium-albuterol (DUONEB) 0.5-2.5 (3) MG/3ML SOLN Take 3 mLs by nebulization every 4 (four) hours as needed (for shortness of breath).   liraglutide (VICTOZA) 18 MG/3ML SOPN INJECT 1.8MG  INTO THE SKIN DAILY   Multiple Vitamin (MULTIVITAMIN WITH MINERALS) TABS tablet Take 1 tablet by mouth daily.   ondansetron (ZOFRAN) 4 MG tablet Take 1 tablet (4 mg total) by mouth every 8 (eight) hours as needed.   OneTouch Delica Lancets 57D MISC Use twice daily diag e11.65   pentoxifylline (TRENTAL) 400 MG CR tablet TAKE 1 TABLET BY  MOUTH THREE TIMES DAILY FOR CLAUDICATION   potassium chloride SA (KLOR-CON) 20 MEQ tablet Take 1 tablet (20 mEq total) by mouth 2 (two) times daily.   sennosides-docusate sodium (SENOKOT-S) 8.6-50 MG tablet Take 1-2 tablets by mouth 2 (two) times daily.   sertraline (ZOLOFT) 100 MG tablet Take 100 mg by mouth daily.    spironolactone (ALDACTONE) 25 MG tablet Take 0.5 tablets by mouth daily.   tiZANidine (ZANAFLEX) 2 MG tablet Take 1 tablet (2 mg total) by mouth 3 (three) times daily.   traZODone (DESYREL) 50 MG tablet Take 50 mg by mouth at  bedtime.   indapamide (LOZOL) 2.5 MG tablet Take 1 tablet (2.5 mg total) by mouth daily. (Patient not taking: Reported on 11/02/2020)   No facility-administered encounter medications on file as of 11/02/2020.    Surgical History: Past Surgical History:  Procedure Laterality Date   AMPUTATION TOE Right 06/23/2018   Procedure: AMPUTATION TOE;  Surgeon: Sharlotte Alamo, DPM;  Location: [facility] ORS;  Service: Podiatry;  Laterality: Right;   CHOLECYSTECTOMY     CORONARY ARTERY BYPASS GRAFT      Medical History: Past Medical History:  Diagnosis Date   Anemia    CAD (coronary artery disease)    Chest pain    Coronary artery disease    Diabetes mellitus without complication (HCC)    Diastolic CHF ([place])    Esophageal reflux    Herpes zoster without mention of complication    Hyperlipidemia    Hypertension    Insomnia    Lumbago    Lymphedema    Neuropathy in diabetes (HCC)    Obstructive chronic bronchitis without exacerbation (HCC)    Other malaise and fatigue    Stroke ([place])    Varicose veins     Family History: Family History  Problem Relation Age of Onset   Diabetes Mother    Hyperlipidemia Mother    Hypertension Mother    Diabetes Father    Hyperlipidemia Father    Hypertension Father    CAD Father     Social History: Social History   Socioeconomic History   Marital status: Married    Spouse name: Not on file   Number of children: Not on file   Years of education: Not on file   Highest education level: Not on file  Occupational History   Occupation: retired  Tobacco Use   Smoking status: Never Smoker   Smokeless tobacco: Never Used  Scientific laboratory technician Use: Never used  Substance and Sexual Activity   Alcohol use: No    Comment: quit alcohol 30 years ago   Drug use: No   Sexual activity: Not Currently  Other Topics Concern   Not on file  Social History Narrative   Lives at home with wife, uses a wheelchair now   Social Determinants of Health   Financial  Resource Strain: Not on file  Food Insecurity: Not on file  Transportation Needs: Not on file  Physical Activity: Not on file  Stress: Not on file  Social Connections: Not on file  Intimate Partner Violence: Not on file    Vital Signs: Blood pressure 130/62, pulse 63, temperature 98.4 F (36.9 C), resp. rate 16, height 5\' 5"  (1.651 m), weight 300 lb (136.1 kg), SpO2 97 %.  Examination: General Appearance: The patient is well-developed, well-nourished, and in no distress. Skin: Gross inspection of skin unremarkable. Head: normocephalic, no gross deformities. Eyes: no gross deformities noted. ENT: ears appear grossly normal  no exudates. Neck: Supple. No thyromegaly. No LAD. Respiratory: Scattered rhonchi are noted bilaterally. Cardiovascular: Normal S1 and S2 without murmur or rub. Extremities: No cyanosis. pulses are equal. Neurologic: Alert and oriented. No involuntary movements.  LABS: No results found for this or any previous visit (from the past 2160 hour(s)).  Radiology: No results found.  No results found.  No results found.    Assessment and Plan: Patient Active Problem List   Diagnosis Date Noted   PTSD (post-traumatic stress disorder) 12/08/2019   Type 2 diabetes mellitus with hyperglycemia ([place]) 03/19/2019   Aplastic anemia, unspecified ([place]) 03/19/2019   Acute pain of right knee 02/23/2019   Uncontrolled type 2 diabetes mellitus with hyperglycemia ([place]) 09/17/2018   Primary insomnia 08/07/2018   Bacteremia due to group B Streptococcus 07/02/2018   COPD (chronic obstructive pulmonary disease) ([place]) 05/13/2018   Dysuria 05/05/2018   Encounter for general adult medical examination with abnormal findings 05/05/2018   CHF exacerbation ([place]) 04/16/2018   Achilles tendon disorder, right 04/02/2018   Essential hypertension 01/28/2018   Lymphedema of both lower extremities 01/28/2018   Cellulitis and abscess of lower extremity 09/20/2017   Edema of both lower  legs due to peripheral venous insufficiency 09/20/2017   Cellulitis 09/20/2017   Atopic dermatitis 09/02/2017   Chronic pain disorder 09/02/2017   CVA (cerebral vascular accident) ([place]) 09/15/2016   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   CVA (cerebral infarction) 02/12/2015   Chronic diastolic CHF (congestive heart failure), NYHA class 3 ([place]) 11/30/2014   Benign essential hypertension 11/06/2014   CAD (coronary artery disease) of artery bypass graft 01/19/2014   CKD (chronic kidney disease), stage III ([place]) 01/19/2014   Acute bronchitis with COPD ([place]) 01/19/2014   Diabetic neuropathy ([place]) 01/19/2014   Insulin dependent diabetes mellitus 01/19/2014   OSA (obstructive sleep apnea) 01/19/2014   Mixed hyperlipidemia 10/29/2013   Peripheral vascular disease, unspecified ([place]) 09/29/2013    1. SOB (shortness of breath)  - Spirometry with Graph  2. Obstructive chronic bronchitis with exacerbation (HCC) Severe disease supportive care medications reviewed we will continue with medical therapy as warranted  3. Lymphedema of both lower extremities Again supportive care patient is being followed by wound management etc.  4. Obesity, morbid ([place]) Multifactorial not able to do much in the terms of using any kind of exercise or trying to reduce his weight  5. OSA (obstructive sleep apnea) Noncompliant with therapy recommended therapy  6. Nocturnal hypoxia Continue with oxygen therapy as recommended especially at nighttime  General Counseling: I have discussed the findings of the evaluation and examination with Bro[place].  I have also discussed any further diagnostic evaluation thatmay be needed or ordered today. English verbalizes understanding of the findings of todays visit. We also reviewed his medications today and discussed drug interactions and side effects including but not limited excessive drowsiness and altered mental states. We also discussed that there is always a risk not just to  him but also people around him. he has been encouraged to call the office with any questions or concerns that should arise related to todays visit.  Orders Placed This Encounter  Procedures   Spirometry with Graph    Order Specific Question:   Where should this test be performed?    Answer:   [facility]    Order Specific Question:   Basic spirometry    Answer:   Yes     Time spent: 76  I have personally obtained a history, examined the patient,  evaluated laboratory and imaging results, formulated the assessment and plan and placed orders.    Allyne Gee, MD [facility] Pulmonary and Critical Care Sleep medicine

## 2020-11-02 NOTE — Patient Instructions (Signed)
Chronic Obstructive Pulmonary Disease  Chronic obstructive pulmonary disease (COPD) is a long-term (chronic) lung problem. When you have COPD, it is hard for air to get in and out of your lungs. Usually the condition gets worse over time, and your lungs will never return to normal. There are things you can do to keep yourself as healthy as possible. What are the causes?  Smoking. This is the most common cause.  Certain genes passed from parent to child (inherited). What increases the risk?  Being exposed to secondhand smoke from cigarettes, pipes, or cigars.  Being exposed to chemicals and other irritants, such as fumes and dust in the work environment.  Having chronic lung conditions or infections. What are the signs or symptoms?  Shortness of breath, especially during physical activity.  A long-term cough with a large amount of thick mucus. Sometimes, the cough may not have any mucus (dry cough).  Wheezing.  Breathing quickly.  Skin that looks gray or blue, especially in the fingers, toes, or lips.  Feeling tired (fatigue).  Weight loss.  Chest tightness.  Having infections often.  Episodes when breathing symptoms become much worse (exacerbations). At the later stages of this disease, you may have swelling in the ankles, feet, or legs. How is this treated?  Taking medicines.  Quitting smoking, if you smoke.  Rehabilitation. This includes steps to make your body work better. It may involve a team of specialists.  Doing exercises.  Making changes to your diet.  Using oxygen.  Lung surgery.  Lung transplant.  Comfort measures (palliative care). Follow these instructions at home: Medicines  Take over-the-counter and prescription medicines only as told by your doctor.  Talk to your doctor before taking any cough or allergy medicines. You may need to avoid medicines that cause your lungs to be dry. Lifestyle  If you smoke, stop smoking. Smoking makes the  problem worse.  Do not smoke or use any products that contain nicotine or tobacco. If you need help quitting, ask your doctor.  Avoid being around things that make your breathing worse. This may include smoke, chemicals, and fumes.  Stay active, but remember to rest as well.  Learn and use tips on how to manage stress and control your breathing.  Make sure you get enough sleep. Most adults need at least 7 hours of sleep every night.  Eat healthy foods. Eat smaller meals more often. Rest before meals. Controlled breathing Learn and use tips on how to control your breathing as told by your doctor. Try:  Breathing in (inhaling) through your nose for 1 second. Then, pucker your lips and breath out (exhale) through your lips for 2 seconds.  Putting one hand on your belly (abdomen). Breathe in slowly through your nose for 1 second. Your hand on your belly should move out. Pucker your lips and breathe out slowly through your lips. Your hand on your belly should move in as you breathe out.   Controlled coughing Learn and use controlled coughing to clear mucus from your lungs. Follow these steps: 1. Lean your head a little forward. 2. Breathe in deeply. 3. Try to hold your breath for 3 seconds. 4. Keep your mouth slightly open while coughing 2 times. 5. Spit any mucus out into a tissue. 6. Rest and do the steps again 1 or 2 times as needed. General instructions  Make sure you get all the shots (vaccines) that your doctor recommends. Ask your doctor about a flu shot and a pneumonia shot.    Use oxygen therapy and pulmonary rehabilitation if told by your doctor. If you need home oxygen therapy, ask your doctor if you should buy a tool to measure your oxygen level (oximeter).  Make a COPD action plan with your doctor. This helps you to know what to do if you feel worse than usual.  Manage any other conditions you have as told by your doctor.  Avoid going outside when it is very hot, cold, or  humid.  Avoid people who have a sickness you can catch (contagious).  Keep all follow-up visits. Contact a doctor if:  You cough up more mucus than usual.  There is a change in the color or thickness of the mucus.  It is harder to breathe than usual.  Your breathing is faster than usual.  You have trouble sleeping.  You need to use your medicines more often than usual.  You have trouble doing your normal activities such as getting dressed or walking around the house. Get help right away if:  You have shortness of breath while resting.  You have shortness of breath that stops you from: ? Being able to talk. ? Doing normal activities.  Your chest hurts for longer than 5 minutes.  Your skin color is more blue than usual.  Your pulse oximeter shows that you have low oxygen for longer than 5 minutes.  You have a fever.  You feel too tired to breathe normally. These symptoms may represent a serious problem that is an emergency. Do not wait to see if the symptoms will go away. Get medical help right away. Call your local emergency services (911 in the [place] Do not drive yourself to the hospital. Summary  Chronic obstructive pulmonary disease (COPD) is a long-term lung problem.  The way your lungs work will never return to normal. Usually the condition gets worse over time. There are things you can do to keep yourself as healthy as possible.  Take over-the-counter and prescription medicines only as told by your doctor.  If you smoke, stop. Smoking makes the problem worse. This information is not intended to replace advice given to you by your health care provider. Make sure you discuss any questions you have with your health care provider. Document Revised: 03/30/2020 Document Reviewed: 03/30/2020 Elsevier Patient Education  2021 [place].   Sleep Apnea Sleep apnea affects breathing during sleep. It causes breathing to stop for a short time or to become shallow. It  can also increase the risk of:  Heart attack.  Stroke.  Being very overweight (obese).  Diabetes.  Heart failure.  Irregular heartbeat. The goal of treatment is to help you breathe normally again. What are the causes? There are three kinds of sleep apnea:  Obstructive sleep apnea. This is caused by a blocked or collapsed airway.  Central sleep apnea. This happens when the brain does not send the right signals to the muscles that control breathing.  Mixed sleep apnea. This is a combination of obstructive and central sleep apnea. The most common cause of this condition is a collapsed or blocked airway. This can happen if:  Your throat muscles are too relaxed.  Your tongue and tonsils are too large.  You are overweight.  Your airway is too small.   What increases the risk?  Being overweight.  Smoking.  Having a small airway.  Being older.  Being male.  Drinking alcohol.  Taking medicines to calm yourself (sedatives or tranquilizers).  Having family members with the condition. What  are the signs or symptoms?  Trouble staying asleep.  Being sleepy or tired during the day.  Getting angry a lot.  Loud snoring.  Headaches in the morning.  Not being able to focus your mind (concentrate).  Forgetting things.  Less interest in sex.  Mood swings.  Personality changes.  Feelings of sadness (depression).  Waking up a lot during the night to pee (urinate).  Dry mouth.  Sore throat. How is this diagnosed?  Your medical history.  A physical exam.  A test that is done when you are sleeping (sleep study). The test is most often done in a sleep lab but may also be done at home. How is this treated?  Sleeping on your side.  Using a medicine to get rid of mucus in your nose (decongestant).  Avoiding the use of alcohol, medicines to help you relax, or certain pain medicines (narcotics).  Losing weight, if needed.  Changing your diet.  Not  smoking.  Using a machine to open your airway while you sleep, such as: ? An oral appliance. This is a mouthpiece that shifts your lower jaw forward. ? A CPAP device. This device blows air through a mask when you breathe out (exhale). ? An EPAP device. This has valves that you put in each nostril. ? A BPAP device. This device blows air through a mask when you breathe in (inhale) and breathe out.  Having surgery if other treatments do not work. It is important to get treatment for sleep apnea. Without treatment, it can lead to:  High blood pressure.  Coronary artery disease.  In men, not being able to have an erection (impotence).  Reduced thinking ability.   Follow these instructions at home: Lifestyle  Make changes that your doctor recommends.  Eat a healthy diet.  Lose weight if needed.  Avoid alcohol, medicines to help you relax, and some pain medicines.  Do not use any products that contain nicotine or tobacco, such as cigarettes, e-cigarettes, and chewing tobacco. If you need help quitting, ask your doctor. General instructions  Take over-the-counter and prescription medicines only as told by your doctor.  If you were given a machine to use while you sleep, use it only as told by your doctor.  If you are having surgery, make sure to tell your doctor you have sleep apnea. You may need to bring your device with you.  Keep all follow-up visits as told by your doctor. This is important. Contact a doctor if:  The machine that you were given to use during sleep bothers you or does not seem to be working.  You do not get better.  You get worse. Get help right away if:  Your chest hurts.  You have trouble breathing in enough air.  You have an uncomfortable feeling in your back, arms, or stomach.  You have trouble talking.  One side of your body feels weak.  A part of your face is hanging down. These symptoms may be an emergency. Do not wait to see if the  symptoms will go away. Get medical help right away. Call your local emergency services (911 in the [place] Do not drive yourself to the hospital. Summary  This condition affects breathing during sleep.  The most common cause is a collapsed or blocked airway.  The goal of treatment is to help you breathe normally while you sleep. This information is not intended to replace advice given to you by your health care provider. Make sure you  discuss any questions you have with your health care provider. Document Revised: 03/08/2018 Document Reviewed: 01/15/2018 Elsevier Patient Education  [place].

## 2020-11-04 DIAGNOSIS — I509 Heart failure, unspecified: Secondary | ICD-10-CM | POA: Diagnosis not present

## 2020-11-08 ENCOUNTER — Other Ambulatory Visit: Payer: Self-pay

## 2020-11-08 DIAGNOSIS — E1142 Type 2 diabetes mellitus with diabetic polyneuropathy: Secondary | ICD-10-CM

## 2020-11-08 MED ORDER — GABAPENTIN 300 MG PO CAPS: ORAL_CAPSULE | ORAL | 3 refills | Status: DC

## 2020-11-11 ENCOUNTER — Other Ambulatory Visit: Payer: Self-pay | Admitting: Internal Medicine

## 2020-11-11 DIAGNOSIS — E1165 Type 2 diabetes mellitus with hyperglycemia: Secondary | ICD-10-CM

## 2020-11-18 ENCOUNTER — Other Ambulatory Visit: Payer: Self-pay

## 2020-11-18 DIAGNOSIS — E876 Hypokalemia: Secondary | ICD-10-CM

## 2020-11-18 MED ORDER — POTASSIUM CHLORIDE CRYS ER 20 MEQ PO TBCR: 20.0000 meq | EXTENDED_RELEASE_TABLET | Freq: Two times a day (BID) | ORAL | 1 refills | Status: DC

## 2020-11-21 ENCOUNTER — Other Ambulatory Visit: Payer: Self-pay | Admitting: Internal Medicine

## 2020-11-21 DIAGNOSIS — I872 Venous insufficiency (chronic) (peripheral): Secondary | ICD-10-CM

## 2020-11-21 DIAGNOSIS — R600 Localized edema: Secondary | ICD-10-CM

## 2020-11-26 DIAGNOSIS — F3341 Major depressive disorder, recurrent, in partial remission: Secondary | ICD-10-CM | POA: Diagnosis not present

## 2020-11-26 DIAGNOSIS — Z9981 Dependence on supplemental oxygen: Secondary | ICD-10-CM | POA: Diagnosis not present

## 2020-11-26 DIAGNOSIS — I110 Hypertensive heart disease with heart failure: Secondary | ICD-10-CM | POA: Diagnosis not present

## 2020-11-26 DIAGNOSIS — Z8673 Personal history of transient ischemic attack (TIA), and cerebral infarction without residual deficits: Secondary | ICD-10-CM | POA: Diagnosis not present

## 2020-11-26 DIAGNOSIS — Z794 Long term (current) use of insulin: Secondary | ICD-10-CM | POA: Diagnosis not present

## 2020-11-26 DIAGNOSIS — I509 Heart failure, unspecified: Secondary | ICD-10-CM | POA: Diagnosis not present

## 2020-11-26 DIAGNOSIS — Z6841 Body Mass Index (BMI) 40.0 and over, adult: Secondary | ICD-10-CM | POA: Diagnosis not present

## 2020-11-26 DIAGNOSIS — E261 Secondary hyperaldosteronism: Secondary | ICD-10-CM | POA: Diagnosis not present

## 2020-11-26 DIAGNOSIS — Z89411 Acquired absence of right great toe: Secondary | ICD-10-CM | POA: Diagnosis not present

## 2020-11-26 DIAGNOSIS — E1142 Type 2 diabetes mellitus with diabetic polyneuropathy: Secondary | ICD-10-CM | POA: Diagnosis not present

## 2020-12-01 ENCOUNTER — Telehealth: Payer: Self-pay

## 2020-12-01 NOTE — Telephone Encounter (Signed)
Left vm to screen for 12/02/20 appointment-Toni

## 2020-12-02 ENCOUNTER — Ambulatory Visit (INDEPENDENT_AMBULATORY_CARE_PROVIDER_SITE_OTHER): Payer: Medicare PPO | Admitting: Nurse Practitioner

## 2020-12-02 ENCOUNTER — Other Ambulatory Visit: Payer: Self-pay

## 2020-12-02 ENCOUNTER — Encounter: Payer: Self-pay | Admitting: Nurse Practitioner

## 2020-12-02 VITALS — BP 135/78 | HR 79 | Temp 97.6°F | Resp 16 | Ht 66.0 in | Wt 300.0 lb

## 2020-12-02 DIAGNOSIS — E1165 Type 2 diabetes mellitus with hyperglycemia: Secondary | ICD-10-CM | POA: Diagnosis not present

## 2020-12-02 DIAGNOSIS — L309 Dermatitis, unspecified: Secondary | ICD-10-CM | POA: Diagnosis not present

## 2020-12-02 DIAGNOSIS — I890 Lymphedema, not elsewhere classified: Secondary | ICD-10-CM

## 2020-12-02 DIAGNOSIS — I10 Essential (primary) hypertension: Secondary | ICD-10-CM

## 2020-12-02 DIAGNOSIS — Z6841 Body Mass Index (BMI) 40.0 and over, adult: Secondary | ICD-10-CM

## 2020-12-02 LAB — POCT GLYCOSYLATED HEMOGLOBIN (HGB A1C): Hemoglobin A1C: 6.1 % — AB (ref 4.0–5.6)

## 2020-12-02 MED ORDER — TRIAMCINOLONE ACETONIDE 0.1 % EX CREA: 1.0000 "application " | TOPICAL_CREAM | Freq: Two times a day (BID) | CUTANEOUS | 0 refills | Status: DC

## 2020-12-02 NOTE — Progress Notes (Signed)
[facility] [place], [place] [address]  Internal MEDICINE  Office Visit Note  Patient Name: Adam Merritt  [id number]  [id number]  Date of Service: 12/02/2020  Chief Complaint  Patient presents with   Follow-up    Rash behind right ear, face, neck, arms   Diabetes   Gastroesophageal Reflux   Hyperlipidemia   Hypertension   Anemia   COPD    HPI Adam Merritt presents for a follow up visit for diabetes and A1C check. He also has a history of gastroesophageal reflux, hyperlipidemia, hypertension, anemia, and COPD. He has a rash scattered on his face, neck, arms and behind his right ear. It is maculopapular with some scaling and peeling skin and he reports that it is itchy.  A1c is 6.1 today, which is improved from October 2021 when her A1C was 6.4.  -blood pressure is well controlled on current medications. He is managed by a cardiologist at the [place].    Current Medication: Outpatient Encounter Medications as of 12/02/2020  Medication Sig   albuterol (VENTOLIN HFA) 108 (90 Base) MCG/ACT inhaler Inhale 2 puffs into the lungs every 6 (six) hours as needed for wheezing or shortness of breath.   aspirin 325 MG EC tablet Take 325 mg by mouth daily.   atorvastatin (LIPITOR) 40 MG tablet Take 40 mg by mouth at bedtime.    azithromycin (ZITHROMAX) 250 MG tablet Take one tab po qd for 14 days   benzonatate (TESSALON) 100 MG capsule Take 1 capsule (100 mg total) by mouth 2 (two) times daily as needed for cough.   carvedilol (COREG) 25 MG tablet Take 25 mg by mouth 2 (two) times daily with a meal.   Cholecalciferol (VITAMIN D3 PO) Take by mouth.   cyclobenzaprine (FLEXERIL) 5 MG tablet TAKE 1 TABLET(5 MG) BY MOUTH TWICE DAILY AS NEEDED FOR MUSCLE SPASMS   docusate sodium (COLACE) 50 MG capsule Take 50 mg by mouth 2 (two) times daily.   febuxostat (ULORIC) 40 MG tablet Take 1 tablet (40 mg total) by mouth daily.   fluticasone (FLOVENT HFA) 110 MCG/ACT inhaler Take 2 puffs in am  and pm  for copd // chronic bronchitis ( rinse mouth)   furosemide (LASIX) 40 MG tablet TAKE 1 TABLET(40 MG) BY MOUTH DAILY   gabapentin (NEURONTIN) 300 MG capsule Take 2 cap in the morning, 1 cap in the evening and 2 cap at night.   glucose blood test strip Use as instructed to check blood sugars twice daily   hydrALAZINE (APRESOLINE) 25 MG tablet Take 25 mg by mouth 3 (three) times daily.    Insulin Glargine (BASAGLAR KWIKPEN) 100 UNIT/ML INJECT 30 TO 36 UNITS UNDER THE SKIN EVERY DAY   Insulin Pen Needle (BD PEN NEEDLE NANO U/F) 32G X 4 MM MISC Use as directed with insulin DX e11.65   ipratropium-albuterol (DUONEB) 0.5-2.5 (3) MG/3ML SOLN Take 3 mLs by nebulization every 4 (four) hours as needed (for shortness of breath).   liraglutide (VICTOZA) 18 MG/3ML SOPN INJECT 1.8MG  INTO THE SKIN DAILY   Multiple Vitamin (MULTIVITAMIN WITH MINERALS) TABS tablet Take 1 tablet by mouth daily.   ondansetron (ZOFRAN) 4 MG tablet Take 1 tablet (4 mg total) by mouth every 8 (eight) hours as needed.   OneTouch Delica Lancets 12W MISC Use twice daily diag e11.65   pentoxifylline (TRENTAL) 400 MG CR tablet TAKE 1 TABLET BY MOUTH THREE TIMES DAILY FOR CLAUDICATION   potassium chloride SA (KLOR-CON) 20 MEQ tablet Take  1 tablet (20 mEq total) by mouth 2 (two) times daily.   sennosides-docusate sodium (SENOKOT-S) 8.6-50 MG tablet Take 1-2 tablets by mouth 2 (two) times daily.   sertraline (ZOLOFT) 100 MG tablet Take 100 mg by mouth daily.    spironolactone (ALDACTONE) 25 MG tablet Take 0.5 tablets by mouth daily.   spironolactone (ALDACTONE) 25 MG tablet Take 0.5 tablets by mouth daily.   tiZANidine (ZANAFLEX) 2 MG tablet Take 1 tablet (2 mg total) by mouth 3 (three) times daily.   traZODone (DESYREL) 50 MG tablet Take 50 mg by mouth at bedtime.   triamcinolone cream (KENALOG) 0.1 % Apply 1 application topically 2 (two) times daily.   [DISCONTINUED] indapamide (LOZOL) 2.5 MG tablet Take 1 tablet (2.5 mg total) by  mouth daily.   No facility-administered encounter medications on file as of 12/02/2020.    Surgical History: Past Surgical History:  Procedure Laterality Date   AMPUTATION TOE Right 06/23/2018   Procedure: AMPUTATION TOE;  Surgeon: Sharlotte Alamo, DPM;  Location: [facility] ORS;  Service: Podiatry;  Laterality: Right;   CHOLECYSTECTOMY     CORONARY ARTERY BYPASS GRAFT      Medical History: Past Medical History:  Diagnosis Date   Anemia    CAD (coronary artery disease)    Chest pain    Coronary artery disease    Diabetes mellitus without complication (HCC)    Diastolic CHF ([place])    Esophageal reflux    Herpes zoster without mention of complication    Hyperlipidemia    Hypertension    Insomnia    Lumbago    Lymphedema    Neuropathy in diabetes (HCC)    Obstructive chronic bronchitis without exacerbation (HCC)    Other malaise and fatigue    Stroke ([place])    Varicose veins     Family History: Family History  Problem Relation Age of Onset   Diabetes Mother    Hyperlipidemia Mother    Hypertension Mother    Diabetes Father    Hyperlipidemia Father    Hypertension Father    CAD Father     Social History   Socioeconomic History   Marital status: Married    Spouse name: Not on file   Number of children: Not on file   Years of education: Not on file   Highest education level: Not on file  Occupational History   Occupation: retired  Tobacco Use   Smoking status: Never   Smokeless tobacco: Never  Vaping Use   Vaping Use: Never used  Substance and Sexual Activity   Alcohol use: No    Comment: quit alcohol 30 years ago   Drug use: No   Sexual activity: Not Currently  Other Topics Concern   Not on file  Social History Narrative   Lives at home with wife, uses a wheelchair now   Social Determinants of Health   Financial Resource Strain: Not on file  Food Insecurity: Not on file  Transportation Needs: Not on file  Physical Activity: Not on file  Stress: Not on file   Social Connections: Not on file  Intimate Partner Violence: Not on file      Review of Systems  Constitutional:  Positive for fatigue.  HENT: Negative.    Eyes: Negative.   Respiratory:  Positive for cough and shortness of breath. Negative for chest tightness and wheezing.   Cardiovascular: Negative.  Negative for chest pain.  Gastrointestinal:  Positive for constipation. Negative for abdominal pain, diarrhea, nausea and vomiting.  Genitourinary: Negative.   Musculoskeletal:  Positive for arthralgias and gait problem (uses walker). Negative for joint swelling and myalgias.  Skin:  Positive for rash.  Neurological:  Negative for dizziness, light-headedness and headaches.  Hematological:  Positive for adenopathy (lymphedema).  Psychiatric/Behavioral:  Negative for behavioral problems, self-injury and suicidal ideas. The patient is not nervous/anxious.    Vital Signs: BP 135/78   Pulse 79   Temp 97.6 F (36.4 C)   Resp 16   Ht 5\' 6"  (1.676 m)   Wt 300 lb (136.1 kg)   SpO2 97%   BMI 48.42 kg/m    Physical Exam Vitals reviewed.  Constitutional:      General: He is not in acute distress.    Appearance: Normal appearance. He is obese. He is not ill-appearing.  HENT:     Head: Normocephalic and atraumatic.  Cardiovascular:     Rate and Rhythm: Normal rate and regular rhythm.     Pulses: Normal pulses.     Heart sounds: Normal heart sounds.  Musculoskeletal:     Right lower leg: Edema present.     Left lower leg: Edema present.  Skin:    General: Skin is warm and dry.     Capillary Refill: Capillary refill takes less than 2 seconds.     Findings: Rash present. Rash is macular, papular and scaling.  Neurological:     Mental Status: He is alert and oriented to person, place, and time.  Psychiatric:        Mood and Affect: Mood normal.        Behavior: Behavior normal.     Assessment/Plan: 1. Uncontrolled type 2 diabetes mellitus with hyperglycemia (HCC) A1C is  improving, no change to current medications, recheck A1C in 3 months. Currently taking victoza and basalgar. Consider adding farxiga at next office visit.   - POCT HgB A1C  2. Essential hypertension Stable with current medications, spironolactone refill ordered. - spironolactone (ALDACTONE) 25 MG tablet; Take 0.5 tablets by mouth daily.  3. BMI 45.0-49.9, adult ([place]) Current weight is 300 lbs. He has gained 12 lbs since December 2021. Discussed diet modifications with patient and his wife. His wife is helping by limiting his sweets and high carb foods. Will continue to monitor.   4. Lymphedema of both lower extremities Stable, wearing compression stockings today.   5. Eczema, unspecified type Small patchy areas of rash, scattered, triamcinolone cream prescribed.  - triamcinolone cream (KENALOG) 0.1 %; Apply 1 application topically 2 (two) times daily.  Dispense: 28.4 g; Refill: 0   General Counseling: Miner verbalizes understanding of the findings of todays visit and agrees with plan of treatment. I have discussed any further diagnostic evaluation that may be needed or ordered today. We also reviewed his medications today. he has been encouraged to call the office with any questions or concerns that should arise related to todays visit.    Orders Placed This Encounter  Procedures   POCT HgB A1C    Meds ordered this encounter  Medications   triamcinolone cream (KENALOG) 0.1 %    Sig: Apply 1 application topically 2 (two) times daily.    Dispense:  28.4 g    Refill:  0    Return in 4 months (on 03/21/2021) for CPE, Lenise Jr PCP previously scheduled. .   Total time spent:30 Minutes Time spent includes review of chart, medications, test results, and follow up plan with the patient.   [place] Controlled Substance Database was reviewed by me.  This patient was seen by Jonetta Osgood, FNP-C in collaboration with Dr. [name] as a part of collaborative care agreement.   Harvin Konicek R.  Valetta Fuller, MSN, FNP-C Internal medicine

## 2020-12-04 DIAGNOSIS — I509 Heart failure, unspecified: Secondary | ICD-10-CM | POA: Diagnosis not present

## 2020-12-08 ENCOUNTER — Other Ambulatory Visit: Payer: Self-pay | Admitting: Internal Medicine

## 2020-12-08 DIAGNOSIS — E1165 Type 2 diabetes mellitus with hyperglycemia: Secondary | ICD-10-CM

## 2020-12-15 ENCOUNTER — Encounter: Payer: Medicare PPO | Attending: Internal Medicine | Admitting: Internal Medicine

## 2020-12-15 ENCOUNTER — Other Ambulatory Visit: Payer: Self-pay

## 2020-12-15 DIAGNOSIS — Z794 Long term (current) use of insulin: Secondary | ICD-10-CM | POA: Insufficient documentation

## 2020-12-15 DIAGNOSIS — S81802A Unspecified open wound, left lower leg, initial encounter: Secondary | ICD-10-CM | POA: Diagnosis not present

## 2020-12-15 DIAGNOSIS — I890 Lymphedema, not elsewhere classified: Secondary | ICD-10-CM

## 2020-12-15 DIAGNOSIS — L97919 Non-pressure chronic ulcer of unspecified part of right lower leg with unspecified severity: Secondary | ICD-10-CM | POA: Insufficient documentation

## 2020-12-15 DIAGNOSIS — G8929 Other chronic pain: Secondary | ICD-10-CM | POA: Insufficient documentation

## 2020-12-15 DIAGNOSIS — I872 Venous insufficiency (chronic) (peripheral): Secondary | ICD-10-CM | POA: Diagnosis not present

## 2020-12-15 DIAGNOSIS — I110 Hypertensive heart disease with heart failure: Secondary | ICD-10-CM | POA: Diagnosis not present

## 2020-12-15 DIAGNOSIS — E11622 Type 2 diabetes mellitus with other skin ulcer: Secondary | ICD-10-CM | POA: Diagnosis not present

## 2020-12-15 DIAGNOSIS — S99921A Unspecified injury of right foot, initial encounter: Secondary | ICD-10-CM | POA: Diagnosis not present

## 2020-12-15 DIAGNOSIS — L97929 Non-pressure chronic ulcer of unspecified part of left lower leg with unspecified severity: Secondary | ICD-10-CM | POA: Insufficient documentation

## 2020-12-15 DIAGNOSIS — E876 Hypokalemia: Secondary | ICD-10-CM

## 2020-12-15 DIAGNOSIS — X58XXXA Exposure to other specified factors, initial encounter: Secondary | ICD-10-CM | POA: Insufficient documentation

## 2020-12-15 DIAGNOSIS — I509 Heart failure, unspecified: Secondary | ICD-10-CM | POA: Insufficient documentation

## 2020-12-15 MED ORDER — POTASSIUM CHLORIDE CRYS ER 20 MEQ PO TBCR: 20.0000 meq | EXTENDED_RELEASE_TABLET | Freq: Two times a day (BID) | ORAL | 1 refills | Status: DC

## 2020-12-15 NOTE — Progress Notes (Addendum)
FARID, GRIGORIAN ([id number]) Visit Report for 12/15/2020 Allergy List Details Patient Name: PURL, CLAYTOR Date of Service: 12/15/2020 10:00 AM Medical Record Number: [id number] Patient Account Number: [id number] Date of Birth/Sex: 1945-07-03 (75 y.o. M) Treating RN: Dolan Amen Primary Care Jaydy Fitzhenry: [name] Other Clinician: Referring Dorwin Fitzhenry: [name] Treating Tesia Lybrand/Extender: Yaakov Guthrie in Treatment: 0 Allergies Active Allergies Corticosteroids (Glucocorticoids) Allergy Notes Electronic Signature(s) Signed: 12/15/2020 4:41:30 PM By: Dolan Amen RN Entered By: Dolan Amen on 12/15/2020 10:32:01 [name] ([id number]) -------------------------------------------------------------------------------- Arrival Information Details Patient Name: [name] Date of Service: 12/15/2020 10:00 AM Medical Record Number: [id number] Patient Account Number: [id number] Date of Birth/Sex: [birth year] (75 y.o. M) Treating RN: Dolan Amen Primary Care Keyan Folson: [name] Other Clinician: Referring Zacharius Funari: [name] Treating Krystn Dermody/Extender: Yaakov Guthrie in Treatment: 0 Visit Information Patient Arrived: Wheel Chair Arrival Time: 10:27 Accompanied By: wife Transfer Assistance: Manual Patient Identification Verified: Yes Secondary Verification Process Completed: Yes History Since Last Visit Had a fall or experienced change in activities of daily living that may affect risk of falls: Yes Pain Present Now: No Electronic Signature(s) Signed: 12/15/2020 4:41:30 PM By: Dolan Amen RN Entered By: Dolan Amen on 12/15/2020 10:31:23 [name] ([id number]) -------------------------------------------------------------------------------- Clinic Level of Care Assessment Details Patient Name: [name] Date of Service: 12/15/2020 10:00 AM Medical Record Number: [id number] Patient Account Number: [id number] Date of Birth/Sex:  06/08/1945 (75 y.o. M) Treating RN: Dolan Amen Primary Care Kenly Xiao: [name] Other Clinician: Referring Altin Sease: [name] Treating Sheily Lineman/Extender: Yaakov Guthrie in Treatment: 0 Clinic Level of Care Assessment Items TOOL 1 Quantity Score X - Use when EandM and Procedure is performed on INITIAL visit 1 0 ASSESSMENTS - Nursing Assessment / Reassessment X - General Physical Exam (combine w/ comprehensive assessment (listed just below) when performed on new 1 20 pt. evals) X- 1 25 Comprehensive Assessment (HX, ROS, Risk Assessments, Wounds Hx, etc.) ASSESSMENTS - Wound and Skin Assessment / Reassessment X - Dermatologic / Skin Assessment (not related to wound area) 1 10 ASSESSMENTS - Ostomy and/or Continence Assessment and Care []  - Incontinence Assessment and Management 0 []  - 0 Ostomy Care Assessment and Management (repouching, etc.) PROCESS - Coordination of Care X - Simple Patient / Family Education for ongoing care 1 15 []  - 0 Complex (extensive) Patient / Family Education for ongoing care []  - 0 Staff obtains Programmer, systems, Records, Test Results / Process Orders []  - 0 Staff telephones HHA, Nursing Homes / Clarify orders / etc []  - 0 Routine Transfer to another Facility (non-emergent condition) []  - 0 Routine Hospital Admission (non-emergent condition) []  - 0 New Admissions / Biomedical engineer / Ordering NPWT, Apligraf, etc. []  - 0 Emergency Hospital Admission (emergent condition) PROCESS - Special Needs []  - Pediatric / Minor Patient Management 0 []  - 0 Isolation Patient Management []  - 0 Hearing / Language / Visual special needs []  - 0 Assessment of Community assistance (transportation, D/C planning, etc.) []  - 0 Additional assistance / Altered mentation []  - 0 Support Surface(s) Assessment (bed, cushion, seat, etc.) INTERVENTIONS - Miscellaneous []  - External ear exam 0 []  - 0 Patient Transfer (multiple staff / Civil Service fast streamer / Similar  devices) []  - 0 Simple Staple / Suture removal (25 or less) []  - 0 Complex Staple / Suture removal (26 or more) []  - 0 Hypo/Hyperglycemic Management (do not check if billed separately) []  - 0 Ankle / Brachial Index (ABI) - do not check if  billed separately Has the patient been seen at the hospital within the last three years: Yes Total Score: 70 Level Of Care: New/Established - Level 2 GEORDIE, NOONEY ([id number]) Electronic Signature(s) Signed: 12/15/2020 4:41:30 PM By: Dolan Amen RN Entered By: Dolan Amen on 12/15/2020 11:08:15 [name] ([id number]) -------------------------------------------------------------------------------- Compression Therapy Details Patient Name: [name] Date of Service: 12/15/2020 10:00 AM Medical Record Number: [id number] Patient Account Number: [id number] Date of Birth/Sex: 1946-03-02 (75 y.o. M) Treating RN: Dolan Amen Primary Care Alvah Gilder: [name] Other Clinician: Referring Rashed Edler: [name] Treating Coen Miyasato/Extender: Yaakov Guthrie in Treatment: 0 Compression Therapy Performed for Wound Assessment: NonWound Condition Lymphedema - Right Leg Performed By: Clinician Dolan Amen, RN Compression Type: Four Layer Post Procedure Diagnosis Same as Pre-procedure Electronic Signature(s) Signed: 12/15/2020 4:41:30 PM By: Dolan Amen RN Entered By: Dolan Amen on 12/15/2020 11:06:05 [name] ([id number]) -------------------------------------------------------------------------------- Compression Therapy Details Patient Name: [name] Date of Service: 12/15/2020 10:00 AM Medical Record Number: [id number] Patient Account Number: [id number] Date of Birth/Sex: [birth year] (75 y.o. M) Treating RN: Dolan Amen Primary Care Hend Mccarrell: [name] Other Clinician: Referring Rielynn Trulson: [name] Treating Prakriti Carignan/Extender: Yaakov Guthrie in Treatment: 0 Compression Therapy Performed for  Wound Assessment: NonWound Condition Lymphedema - Left Leg Performed By: Clinician Dolan Amen, RN Compression Type: Four Layer Post Procedure Diagnosis Same as Pre-procedure Electronic Signature(s) Signed: 12/15/2020 4:41:30 PM By: Dolan Amen RN Entered By: Dolan Amen on 12/15/2020 11:06:21 [name] ([id number]) -------------------------------------------------------------------------------- Lower Extremity Assessment Details Patient Name: [name] Date of Service: 12/15/2020 10:00 AM Medical Record Number: [id number] Patient Account Number: [id number] Date of Birth/Sex: 09/08/1945 (75 y.o. M) Treating RN: Dolan Amen Primary Care Anushree Dorsi: [name] Other Clinician: Referring Arjay Jaskiewicz: [name] Treating Minal Stuller/Extender: Yaakov Guthrie in Treatment: 0 Edema Assessment Assessed: [Left: Yes] [Right: Yes] Edema: [Left: Yes] [Right: Yes] Calf Left: Right: Point of Measurement: 31 cm From Medial Instep 58 cm 51 cm Ankle Left: Right: Point of Measurement: 10 cm From Medial Instep 36 cm 30.5 cm Knee To Floor Left: Right: From Medial Instep 39 cm 41 cm Vascular Assessment Pulses: Dorsalis Pedis Palpable: [Left:No Yes] [Right:No Yes] Notes pulse non-palpable d/t swelling Electronic Signature(s) Signed: 12/15/2020 4:41:30 PM By: Dolan Amen RN Entered By: Dolan Amen on 12/15/2020 10:45:59 [name] ([id number]) -------------------------------------------------------------------------------- Multi Wound Chart Details Patient Name: [name] Date of Service: 12/15/2020 10:00 AM Medical Record Number: [id number] Patient Account Number: [id number] Date of Birth/Sex: [birth year] (75 y.o. M) Treating RN: Dolan Amen Primary Care Jaymes Revels: [name] Other Clinician: Referring Myron Stankovich: [name] Treating Jachai Okazaki/Extender: Yaakov Guthrie in Treatment: 0 Vital Signs Height(in): Pulse(bpm):  59 Weight(lbs): Blood Pressure(mmHg): 119/77 Body Mass Index(BMI): Temperature(F): 98.4 Respiratory Rate(breaths/min): 20 Wound Assessments Treatment Notes Electronic Signature(s) Signed: 12/17/2020 3:44:21 PM By: Kalman Shan DO Previous Signature: 12/15/2020 4:41:30 PM Version By: Dolan Amen RN Entered By: Kalman Shan on 12/17/2020 15:35:29 [name] ([id number]) -------------------------------------------------------------------------------- [place] Details Patient Name: [name] Date of Service: 12/15/2020 10:00 AM Medical Record Number: [id number] Patient Account Number: [id number] Date of Birth/Sex: 04-30-1946 (75 y.o. M) Treating RN: Dolan Amen Primary Care Aadit Hagood: [name] Other Clinician: Referring Carmelle Bamberg: [name] Treating Virda Betters/Extender: Yaakov Guthrie in Treatment: 0 Active Inactive Wound/Skin Impairment Nursing Diagnoses: Impaired tissue integrity Goals: Patient/caregiver will verbalize understanding of skin care regimen Date Initiated: 12/15/2020 Target Resolution Date: 12/15/2020 Goal Status: Active Ulcer/skin breakdown  will have a volume reduction of 30% by week 4 Date Initiated: 12/15/2020 Target Resolution Date: 01/15/2021 Goal Status: Active Ulcer/skin breakdown will have a volume reduction of 50% by week 8 Date Initiated: 12/15/2020 Target Resolution Date: 02/15/2021 Goal Status: Active Ulcer/skin breakdown will have a volume reduction of 80% by week 12 Date Initiated: 12/15/2020 Target Resolution Date: 03/17/2021 Goal Status: Active Ulcer/skin breakdown will heal within 14 weeks Date Initiated: 12/15/2020 Target Resolution Date: 04/17/2021 Goal Status: Active Interventions: Assess patient/caregiver ability to obtain necessary supplies Assess patient/caregiver ability to perform ulcer/skin care regimen upon admission and as needed Assess ulceration(s) every visit Treatment  Activities: Skin care regimen initiated : 12/15/2020 Notes: Electronic Signature(s) Signed: 12/15/2020 4:41:30 PM By: Dolan Amen RN Entered By: Dolan Amen on 12/15/2020 11:05:36 [name] ([id number]) -------------------------------------------------------------------------------- Non-Wound Condition Assessment Details Patient Name: [name] Date of Service: 12/15/2020 10:00 AM Medical Record Number: [id number] Patient Account Number: [id number] Date of Birth/Gender: June 23, 1945 (75 y.o. M) Treating RN: Dolan Amen Primary Care Physician: [name] Other Clinician: Referring Physician: [name] Treating Physician/Extender: Yaakov Guthrie in Treatment: 0 Non-Wound Condition: Condition: Lymphedema Location: Leg Side: Right Photos Electronic Signature(s) Signed: 12/15/2020 4:41:30 PM By: Dolan Amen RN Entered By: Dolan Amen on 12/15/2020 10:40:46 [name] ([id number]) -------------------------------------------------------------------------------- Non-Wound Condition Assessment Details Patient Name: [name] Date of Service: 12/15/2020 10:00 AM Medical Record Number: [id number] Patient Account Number: [id number] Date of Birth/Gender: 16-Feb-1946 (75 y.o. M) Treating RN: Dolan Amen Primary Care Physician: [name] Other Clinician: Referring Physician: [name] Treating Physician/Extender: Yaakov Guthrie in Treatment: 0 Non-Wound Condition: Condition: Lymphedema Location: Leg Side: Left Photos Electronic Signature(s) Signed: 12/15/2020 4:41:30 PM By: Dolan Amen RN Entered By: Dolan Amen on 12/15/2020 10:40:46 [name] ([id number]) -------------------------------------------------------------------------------- Pain Assessment Details Patient Name: [name] Date of Service: 12/15/2020 10:00 AM Medical Record Number: [id number] Patient Account Number: [id number] Date of Birth/Sex:  1945/12/18 (75 y.o. M) Treating RN: Dolan Amen Primary Care Rajveer Handler: [name] Other Clinician: Referring Elzada Pytel: [name] Treating Kolby Myung/Extender: Yaakov Guthrie in Treatment: 0 Active Problems Location of Pain Severity and Description of Pain Patient Has Paino No Site Locations Rate the pain. Current Pain Level: 0 Pain Management and Medication Current Pain Management: Notes c/o chronic pain Electronic Signature(s) Signed: 12/15/2020 4:41:30 PM By: Dolan Amen RN Entered By: Dolan Amen on 12/15/2020 10:31:14 [name] ([id number]) -------------------------------------------------------------------------------- Patient/Caregiver Education Details Patient Name: [name] Date of Service: 12/15/2020 10:00 AM Medical Record Number: [id number] Patient Account Number: [id number] Date of Birth/Gender: December 03, 1945 (75 y.o. M) Treating RN: Dolan Amen Primary Care Physician: [name] Other Clinician: Referring Physician: [name] Treating Physician/Extender: Yaakov Guthrie in Treatment: 0 Education Assessment Education Provided To: Patient Education Topics Provided Wound/Skin Impairment: Methods: Explain/Verbal Responses: State content correctly Electronic Signature(s) Signed: 12/15/2020 4:41:30 PM By: Dolan Amen RN Entered By: Dolan Amen on 12/15/2020 11:08:33 [name] ([id number]) -------------------------------------------------------------------------------- [place] Details Patient Name: [name] Date of Service: 12/15/2020 10:00 AM Medical Record Number: [id number] Patient Account Number: [id number] Date of Birth/Sex: 1946-03-04 (75 y.o. M) Treating RN: Dolan Amen Primary Care Nery Frappier: [name] Other Clinician: Referring Bentzion Dauria: [name] Treating Malike Foglio/Extender: Yaakov Guthrie in Treatment: 0 Vital Signs Time Taken: 10:31 Temperature (F): 98.4 Pulse (bpm):  59 Respiratory Rate (breaths/min): 20 Blood Pressure (mmHg): 119/77 Reference Range: 80 - 120 mg / dl Electronic Signature(s) Signed: 12/15/2020 4:41:30  PM By: Dolan Amen RN Entered By: Dolan Amen on 12/15/2020 10:31:46

## 2020-12-15 NOTE — Progress Notes (Signed)
TRIPP, GOINS ([id number]) Visit Report for 12/15/2020 Abuse/Suicide Risk Screen Details Patient Name: Adam Merritt, Adam Merritt Date of Service: 12/15/2020 10:00 AM Medical Record Number: [id number] Patient Account Number: [id number] Date of Birth/Sex: [birth year] (75 y.o. M) Treating RN: Dolan Amen Primary Care Geraldyn Shain: [name] Other Clinician: Referring Miriah Maruyama: [name] Treating Kamdyn Covel/Extender: Yaakov Guthrie in Treatment: 0 Abuse/Suicide Risk Screen Items Answer ABUSE RISK SCREEN: Has anyone close to you tried to hurt or harm you recentlyo No Do you feel uncomfortable with anyone in your familyo No Has anyone forced you do things that you didnot want to doo No Electronic Signature(s) Signed: 12/15/2020 4:41:30 PM By: Dolan Amen RN Entered By: Dolan Amen on 12/15/2020 10:32:29 Adam Merritt ([id number]) -------------------------------------------------------------------------------- Activities of Daily Living Details Patient Name: Adam Merritt Date of Service: 12/15/2020 10:00 AM Medical Record Number: [id number] Patient Account Number: [id number] Date of Birth/Sex: [birth year] (75 y.o. M) Treating RN: Dolan Amen Primary Care Jennifer Holland: [name] Other Clinician: Referring Jadis Pitter: [name] Treating Froylan Hobby/Extender: Yaakov Guthrie in Treatment: 0 Activities of Daily Living Items Answer Activities of Daily Living (Please select one for each item) Drive Automobile Not Able Take Medications Need Assistance Use Telephone Completely Able Care for Appearance Need Assistance Use Toilet Need Assistance Bath / Shower Need Assistance Dress Self Need Assistance Feed Self Need Assistance Walk Need Assistance Get In / Out Bed Need Assistance Housework Need Assistance Prepare Meals Need Assistance Handle Money Need Assistance Shop for Self Need Assistance Electronic Signature(s) Signed: 12/15/2020 4:41:30 PM By: Dolan Amen RN Entered  By: Dolan Amen on 12/15/2020 10:33:00 Adam Merritt ([id number]) -------------------------------------------------------------------------------- Education Screening Details Patient Name: Adam Merritt Date of Service: 12/15/2020 10:00 AM Medical Record Number: [id number] Patient Account Number: [id number] Date of Birth/Sex: [birth year] (75 y.o. M) Treating RN: Dolan Amen Primary Care Donyelle Enyeart: [name] Other Clinician: Referring Shawntavia Saunders: [name] Treating Mariany Mackintosh/Extender: Yaakov Guthrie in Treatment: 0 Primary Learner Assessed: Patient Learning Preferences/Education Level/Primary Language Learning Preference: Explanation, Demonstration Highest Education Level: High School Preferred Language: English Cognitive Barrier Language Barrier: No Translator Needed: No Memory Deficit: No Emotional Barrier: No Cultural/Religious Beliefs Affecting Medical Care: No Physical Barrier Impaired Vision: No Impaired Hearing: Yes Hearing Aid Decreased Hand dexterity: No Knowledge/Comprehension Knowledge Level: Medium Comprehension Level: Medium Ability to understand written instructions: Medium Ability to understand verbal instructions: Medium Motivation Anxiety Level: Calm Cooperation: Cooperative Education Importance: Acknowledges Need Interest in Health Problems: Asks Questions Perception: Coherent Willingness to Engage in Self-Management Medium Activities: Readiness to Engage in Self-Management Medium Activities: Electronic Signature(s) Signed: 12/15/2020 4:41:30 PM By: Dolan Amen RN Entered By: Dolan Amen on 12/15/2020 10:33:30 Adam Merritt ([id number]) -------------------------------------------------------------------------------- Fall Risk Assessment Details Patient Name: Adam Merritt Date of Service: 12/15/2020 10:00 AM Medical Record Number: [id number] Patient Account Number: [id number] Date of Birth/Sex: 1945/07/27 (75 y.o.  M) Treating RN: Dolan Amen Primary Care Trestan Vahle: [name] Other Clinician: Referring Cosandra Plouffe: [name] Treating Garcia Dalzell/Extender: Yaakov Guthrie in Treatment: 0 Fall Risk Assessment Items Have you had 2 or more falls in the last 12 monthso 0 Yes Have you had any fall that resulted in injury in the last 12 monthso 0 No FALLS RISK SCREEN History of falling - immediate or within 3 months 25 Yes Secondary diagnosis (Do you have 2 or more medical diagnoseso) 15 Yes Ambulatory aid None/bed rest/wheelchair/nurse 0 Yes Crutches/cane/walker 0 No Furniture 0 No Intravenous therapy Access/Saline/Heparin Lock 0 No Gait/Transferring Normal/ bed  rest/ wheelchair 0 Yes Weak (short steps with or without shuffle, stooped but able to lift head while walking, may 0 No seek support from furniture) Impaired (short steps with shuffle, may have difficulty arising from chair, head down, impaired 0 No balance) Mental Status Oriented to own ability 0 Yes Electronic Signature(s) Signed: 12/15/2020 4:41:30 PM By: Dolan Amen RN Entered By: Dolan Amen on 12/15/2020 10:33:49 Adam Merritt ([id number]) -------------------------------------------------------------------------------- Foot Assessment Details Patient Name: Adam Merritt Date of Service: 12/15/2020 10:00 AM Medical Record Number: [id number] Patient Account Number: [id number] Date of Birth/Sex: 1946/03/04 (75 y.o. M) Treating RN: Dolan Amen Primary Care Tyona Nilsen: [name] Other Clinician: Referring Ilian Wessell: [name] Treating Elwood Bazinet/Extender: Yaakov Guthrie in Treatment: 0 Foot Assessment Items Site Locations + = Sensation present, - = Sensation absent, C = Callus, U = Ulcer R = Redness, W = Warmth, M = Maceration, PU = Pre-ulcerative lesion F = Fissure, S = Swelling, D = Dryness Assessment Right: Left: Other Deformity: No No Prior Foot Ulcer: No No Prior Amputation: No No Charcot  Joint: No No Ambulatory Status: Non-ambulatory Assistance Device: Wheelchair Gait: Administrator, arts) Signed: 12/15/2020 4:41:30 PM By: Dolan Amen RN Entered By: Dolan Amen on 12/15/2020 10:34:08 Adam Merritt ([id number]) -------------------------------------------------------------------------------- Nutrition Risk Screening Details Patient Name: Adam Merritt Date of Service: 12/15/2020 10:00 AM Medical Record Number: [id number] Patient Account Number: [id number] Date of Birth/Sex: 04/30/1946 (75 y.o. M) Treating RN: Dolan Amen Primary Care Timtohy Broski: [name] Other Clinician: Referring Anala Whisenant: [name] Treating Ashlei Chinchilla/Extender: Yaakov Guthrie in Treatment: 0 Height (in): Weight (lbs): Body Mass Index (BMI): Nutrition Risk Screening Items Score Screening NUTRITION RISK SCREEN: I have an illness or condition that made me change the kind and/or amount of food I eat 0 No I eat fewer than two meals per day 0 No I eat few fruits and vegetables, or milk products 0 No I have three or more drinks of beer, liquor or wine almost every day 0 No I have tooth or mouth problems that make it hard for me to eat 0 No I don't always have enough money to buy the food I need 0 No I eat alone most of the time 0 No I take three or more different prescribed or over-the-counter drugs a day 1 Yes Without wanting to, I have lost or gained 10 pounds in the last six months 0 No I am not always physically able to shop, cook and/or feed myself 0 No Nutrition Protocols Good Risk Protocol 0 No interventions needed Moderate Risk Protocol High Risk Proctocol Risk Level: Good Risk Score: 1 Electronic Signature(s) Signed: 12/15/2020 4:41:30 PM By: Dolan Amen RN Entered By: Dolan Amen on 12/15/2020 10:33:56

## 2020-12-17 ENCOUNTER — Other Ambulatory Visit: Payer: Self-pay

## 2020-12-17 DIAGNOSIS — I110 Hypertensive heart disease with heart failure: Secondary | ICD-10-CM | POA: Diagnosis not present

## 2020-12-17 DIAGNOSIS — L97919 Non-pressure chronic ulcer of unspecified part of right lower leg with unspecified severity: Secondary | ICD-10-CM | POA: Diagnosis not present

## 2020-12-17 DIAGNOSIS — L97929 Non-pressure chronic ulcer of unspecified part of left lower leg with unspecified severity: Secondary | ICD-10-CM | POA: Diagnosis not present

## 2020-12-17 DIAGNOSIS — I509 Heart failure, unspecified: Secondary | ICD-10-CM | POA: Diagnosis not present

## 2020-12-17 DIAGNOSIS — Z794 Long term (current) use of insulin: Secondary | ICD-10-CM | POA: Diagnosis not present

## 2020-12-17 DIAGNOSIS — E11622 Type 2 diabetes mellitus with other skin ulcer: Secondary | ICD-10-CM | POA: Diagnosis not present

## 2020-12-17 DIAGNOSIS — S99921A Unspecified injury of right foot, initial encounter: Secondary | ICD-10-CM | POA: Diagnosis not present

## 2020-12-17 DIAGNOSIS — I872 Venous insufficiency (chronic) (peripheral): Secondary | ICD-10-CM | POA: Diagnosis not present

## 2020-12-17 DIAGNOSIS — I890 Lymphedema, not elsewhere classified: Secondary | ICD-10-CM | POA: Diagnosis not present

## 2020-12-17 NOTE — Progress Notes (Addendum)
SYLAR, VOONG ([id number]) Visit Report for 12/15/2020 Chief Complaint Document Details Patient Name: Adam, Merritt Date of Service: 12/15/2020 10:00 AM Medical Record Number: [id number] Patient Account Number: [id number] Date of Birth/Sex: [birth year] (75 y.o. M) Treating RN: Adam Merritt Primary Care Provider: Clayborn Merritt Other Clinician: Referring Provider: Clayborn Merritt Treating Provider/Extender: Adam Merritt in Treatment: 0 Information Obtained from: Patient Chief Complaint Bilateral LE edema and weeping Electronic Signature(s) Signed: 12/17/2020 3:44:21 PM By: Adam Shan DO Entered By: Adam Merritt on 12/17/2020 15:35:50 Adam Merritt ([id number]) -------------------------------------------------------------------------------- HPI Details Patient Name: Adam Merritt Date of Service: 12/15/2020 10:00 AM Medical Record Number: [id number] Patient Account Number: [id number] Date of Birth/Sex: 04-Feb-1946 (75 y.o. M) Treating RN: Adam Merritt Primary Care Provider: Clayborn Merritt Other Clinician: Referring Provider: Clayborn Merritt Treating Provider/Extender: Adam Merritt in Treatment: 0 History of Present Illness HPI Description: Admission 7/13 [name] is a 75 year old male with a past medical history of lymphedema, insulin-dependent type 2 diabetes, COPD and congestive heart failure that presents to the clinic for increased drainage from his legs bilaterally, Left greater than right. Patient has lymphedema. He has been seen in our clinic on multiple occasions for this issue. He currently denies signs of infection. He has not been using his lymphedema pumps. Electronic Signature(s) Signed: 12/17/2020 3:44:21 PM By: Adam Shan DO Entered By: Adam Merritt on 12/17/2020 15:41:03 Adam Merritt ([id number]) -------------------------------------------------------------------------------- Physical Exam Details Patient Name: Adam Merritt Date of  Service: 12/15/2020 10:00 AM Medical Record Number: [id number] Patient Account Number: [id number] Date of Birth/Sex: Nov 12, 1945 (75 y.o. M) Treating RN: Adam Merritt Primary Care Provider: Clayborn Merritt Other Clinician: Referring Provider: Clayborn Merritt Treating Provider/Extender: Adam Merritt in Treatment: 0 Constitutional . Psychiatric . Notes Lymphedema skin changes bilaterally. Nonpitting edema bilaterally. Weeping noted throughout both legs. Left is greater than right. No obvious signs of infection. Electronic Signature(s) Signed: 12/17/2020 3:44:21 PM By: Adam Shan DO Entered By: Adam Merritt on 12/17/2020 15:41:42 Adam Merritt ([id number]) -------------------------------------------------------------------------------- Physician Orders Details Patient Name: Adam Merritt Date of Service: 12/15/2020 10:00 AM Medical Record Number: [id number] Patient Account Number: [id number] Date of Birth/Sex: 10-Jan-1946 (75 y.o. M) Treating RN: Adam Merritt Primary Care Provider: Clayborn Merritt Other Clinician: Referring Provider: Clayborn Merritt Treating Provider/Extender: Adam Merritt in Treatment: 0 Verbal / Phone Orders: No Diagnosis Coding Follow-up Appointments o Return Appointment in 1 week. o Nurse Visit as needed - Friday for rewrap Bathing/ Shower/ Hygiene o May shower with wound dressing protected with water repellent cover or cast protector. o No tub bath. Edema Control - Lymphedema / Segmental Compressive Device / Other Bilateral Lower Extremities o 4 Layer Compression System Lymphedema. - silvercell on wet areas followed by xsorb o Elevate legs to the level of the heart and pump ankles as often as possible o Elevate leg(s) parallel to the floor when sitting. o Compression Pump: Use compression pump on left lower extremity for 60 minutes, twice daily. o Compression Pump: Use compression pump on right lower extremity for 60 minutes,  twice daily. o DO YOUR BEST to sleep in the bed at night. DO NOT sleep in your recliner. Long hours of sitting in a recliner leads to swelling of the legs and/or potential wounds on your backside. Electronic Signature(s) Signed: 12/15/2020 4:41:30 PM By: Adam Amen RN Signed: 12/17/2020 3:44:21 PM By: Adam Shan DO Entered By: Adam Merritt on 12/15/2020 11:07:49 Adam Merritt ([id number]) -------------------------------------------------------------------------------- Problem List Details Patient Name: Adam Merritt,  Adam Merritt Date of Service: 12/15/2020 10:00 AM Medical Record Number: [id number] Patient Account Number: [id number] Date of Birth/Sex: 19-Jan-1946 (75 y.o. M) Treating RN: Adam Merritt Primary Care Provider: Clayborn Merritt Other Clinician: Referring Provider: Clayborn Merritt Treating Provider/Extender: Adam Merritt in Treatment: 0 Active Problems ICD-10 Encounter Code Description Active Date MDM Diagnosis I89.0 Lymphedema, not elsewhere classified 12/15/2020 No Yes Inactive Problems Resolved Problems Electronic Signature(s) Signed: 12/17/2020 3:44:21 PM By: Adam Shan DO Entered By: Adam Merritt on 12/17/2020 15:40:51 Adam Merritt ([id number]) -------------------------------------------------------------------------------- Progress Note Details Patient Name: Adam Merritt Date of Service: 12/15/2020 10:00 AM Medical Record Number: [id number] Patient Account Number: [id number] Date of Birth/Sex: 11-Apr-1946 (75 y.o. M) Treating RN: Adam Merritt Primary Care Provider: Clayborn Merritt Other Clinician: Referring Provider: Clayborn Merritt Treating Provider/Extender: Adam Merritt in Treatment: 0 Subjective Chief Complaint Information obtained from Patient Bilateral LE edema and weeping History of Present Illness (HPI) Admission 7/13 Adam Merritt is a 75 year old male with a past medical history of lymphedema, insulin-dependent type 2 diabetes, COPD  and congestive heart failure that presents to the clinic for increased drainage from his legs bilaterally, Left greater than right. Patient has lymphedema. He has been seen in our clinic on multiple occasions for this issue. He currently denies signs of infection. He has not been using his lymphedema pumps. Patient History Information obtained from Patient. Allergies Corticosteroids (Glucocorticoids) Family History Cancer - Paternal Grandparents, Kidney Disease - Siblings, Lung Disease - Mother, No family history of Diabetes, Heart Disease, Hereditary Spherocytosis, Hypertension, Seizures, Stroke, Thyroid Problems, Tuberculosis. Social History Never smoker, Marital Status - Married, Alcohol Use - Never, Drug Use - No History, Caffeine Use - Never. Medical History Eyes Denies history of Cataracts, Glaucoma, Optic Neuritis Ear/Nose/Mouth/Throat Denies history of Chronic sinus problems/congestion, Middle ear problems Hematologic/Lymphatic Patient has history of Anemia - aplastic anemia, Lymphedema Denies history of Hemophilia, Human Immunodeficiency Virus, Sickle Cell Disease Respiratory Patient has history of Chronic Obstructive Pulmonary Disease (COPD), Sleep Apnea Denies history of Aspiration, Asthma, Pneumothorax, Tuberculosis Cardiovascular Patient has history of Congestive Heart Failure, Coronary Artery Disease, Hypertension, Peripheral Venous Disease Denies history of Angina, Arrhythmia, Deep Vein Thrombosis, Hypotension, Myocardial Infarction, Peripheral Arterial Disease, Phlebitis, Vasculitis Gastrointestinal Denies history of Cirrhosis , Colitis, Crohn s, Hepatitis A, Hepatitis B, Hepatitis C Endocrine Patient has history of Type II Diabetes Denies history of Type I Diabetes Genitourinary Denies history of End Stage Renal Disease Immunological Denies history of Lupus Erythematosus, Raynaud s, Scleroderma Integumentary (Skin) Denies history of History of Burn, History of  pressure wounds Musculoskeletal Patient has history of Osteoarthritis Denies history of Gout, Rheumatoid Arthritis, Osteomyelitis Neurologic Patient has history of Neuropathy Denies history of Dementia, Quadriplegia, Paraplegia, Seizure Disorder Oncologic Patient has history of Received Chemotherapy - last dose 2006 Denies history of Received Radiation Psychiatric Denies history of Anorexia/bulimia, Confinement Anxiety Hospitalization/Surgery History - bronchitis. Medical And Surgical History Notes Adam Merritt, Adam Merritt ([id number]) Respiratory home O2 at night as needed Cardiovascular varicose veins Neurologic CVA - 1992 Objective Constitutional Vitals Time Taken: 10:31 AM, Temperature: 98.4 F, Pulse: 59 bpm, Respiratory Rate: 20 breaths/min, Blood Pressure: 119/77 mmHg. General Notes: Lymphedema skin changes bilaterally. Nonpitting edema bilaterally. Weeping noted throughout both legs. Left is greater than right. No obvious signs of infection. Assessment Active Problems ICD-10 Lymphedema, not elsewhere classified Patient returns for worsening of his lymphedema. He has more weeping that he cannot control. He I recommended He start using his lymphedema pumps. We will go ahead and wrap his  legs and use silver alginate under to the weeping areas. Procedures There was a Four Layer Compression Therapy Procedure by Adam Amen, RN. Post procedure Diagnosis Wound #: Same as Pre-Procedure There was a Four Layer Compression Therapy Procedure by Adam Amen, RN. Post procedure Diagnosis Wound #: Same as Pre-Procedure Plan Follow-up Appointments: Return Appointment in 1 week. Nurse Visit as needed - Friday for rewrap Bathing/ Shower/ Hygiene: May shower with wound dressing protected with water repellent cover or cast protector. No tub bath. Edema Control - Lymphedema / Segmental Compressive Device / Other: 4 Layer Compression System Lymphedema. - silvercell on wet areas followed  by xsorb Elevate legs to the level of the heart and pump ankles as often as possible Elevate leg(s) parallel to the floor when sitting. Compression Pump: Use compression pump on left lower extremity for 60 minutes, twice daily. Compression Pump: Use compression pump on right lower extremity for 60 minutes, twice daily. Adam Merritt, Adam Merritt ([id number]) DO YOUR BEST to sleep in the bed at night. DO NOT sleep in your recliner. Long hours of sitting in a recliner leads to swelling of the legs and/or potential wounds on your backside. 1. 4-layer compression bilaterally 2. Silver alginate 3. Follow-up On Friday for nurse visit to rewrap legs and next week with me Electronic Signature(s) Signed: 12/17/2020 3:44:21 PM By: Adam Shan DO Entered By: Adam Merritt on 12/17/2020 15:43:48 Adam Merritt ([id number]) -------------------------------------------------------------------------------- ROS/PFSH Details Patient Name: Adam Merritt Date of Service: 12/15/2020 10:00 AM Medical Record Number: [id number] Patient Account Number: [id number] Date of Birth/Sex: 1945/07/05 (75 y.o. M) Treating RN: Adam Merritt Primary Care Provider: Clayborn Merritt Other Clinician: Referring Provider: Clayborn Merritt Treating Provider/Extender: Adam Merritt in Treatment: 0 Information Obtained From Patient Eyes Medical History: Negative for: Cataracts; Glaucoma; Optic Neuritis Ear/Nose/Mouth/Throat Medical History: Negative for: Chronic sinus problems/congestion; Middle ear problems Hematologic/Lymphatic Medical History: Positive for: Anemia - aplastic anemia; Lymphedema Negative for: Hemophilia; Human Immunodeficiency Virus; Sickle Cell Disease Respiratory Medical History: Positive for: Chronic Obstructive Pulmonary Disease (COPD); Sleep Apnea Negative for: Aspiration; Asthma; Pneumothorax; Tuberculosis Past Medical History Notes: home O2 at night as needed Cardiovascular Medical  History: Positive for: Congestive Heart Failure; Coronary Artery Disease; Hypertension; Peripheral Venous Disease Negative for: Angina; Arrhythmia; Deep Vein Thrombosis; Hypotension; Myocardial Infarction; Peripheral Arterial Disease; Phlebitis; Vasculitis Past Medical History Notes: varicose veins Gastrointestinal Medical History: Negative for: Cirrhosis ; Colitis; Crohnos; Hepatitis A; Hepatitis B; Hepatitis C Endocrine Medical History: Positive for: Type II Diabetes Negative for: Type I Diabetes Time with diabetes: since 2006 Treated with: Insulin Blood sugar tested every day: Yes Tested : Blood sugar testing results: Breakfast: 135 Genitourinary Medical History: Negative for: End Stage Renal Disease Immunological Adam Merritt, Adam Merritt ([id number]) Medical History: Negative for: Lupus Erythematosus; Raynaudos; Scleroderma Integumentary (Skin) Medical History: Negative for: History of Burn; History of pressure wounds Musculoskeletal Medical History: Positive for: Osteoarthritis Negative for: Gout; Rheumatoid Arthritis; Osteomyelitis Neurologic Medical History: Positive for: Neuropathy Negative for: Dementia; Quadriplegia; Paraplegia; Seizure Disorder Past Medical History Notes: CVA - 1992 Oncologic Medical History: Positive for: Received Chemotherapy - last dose 2006 Negative for: Received Radiation Psychiatric Medical History: Negative for: Anorexia/bulimia; Confinement Anxiety Immunizations Pneumococcal Vaccine: Received Pneumococcal Vaccination: Yes Implantable Devices None Hospitalization / Surgery History Type of Hospitalization/Surgery bronchitis Family and Social History Cancer: Yes - Paternal Grandparents; Diabetes: No; Heart Disease: No; Hereditary Spherocytosis: No; Hypertension: No; Kidney Disease: Yes - Siblings; Lung Disease: Yes - Mother; Seizures: No; Stroke: No; Thyroid Problems: No; Tuberculosis: No; Never  smoker; Marital Status - Married; Alcohol  Use: Never; Drug Use: No History; Caffeine Use: Never; Financial Concerns: No; Food, Clothing or Shelter Needs: No; Support System Lacking: No; Transportation Concerns: No Electronic Signature(s) Signed: 12/15/2020 4:41:30 PM By: Adam Amen RN Signed: 12/17/2020 3:44:21 PM By: Adam Shan DO Entered By: Adam Merritt on 12/15/2020 10:32:21 Adam Merritt, Adam Merritt ([id number]) -------------------------------------------------------------------------------- [place] Details Patient Name: Adam Merritt Date of Service: 12/15/2020 Medical Record Number: [id number] Patient Account Number: [id number] Date of Birth/Sex: Dec 27, 1945 (75 y.o. M) Treating RN: Adam Merritt Primary Care Provider: Clayborn Merritt Other Clinician: Referring Provider: Clayborn Merritt Treating Provider/Extender: Adam Merritt in Treatment: 0 Diagnosis Coding ICD-10 Codes Code Description I89.0 Lymphedema, not elsewhere classified S81.802A Unspecified open wound, left lower leg, initial encounter Facility Procedures CPT4: Description Modifier Quantity Code [id number] 99212 - WOUND CARE VISIT-LEV 2 EST PT 1 CPT4: 34373578 [id number] BILATERAL: Application of multi-layer venous compression system; leg (below knee), including 1 ankle and foot. Physician Procedures CPT4 Code: 8412820 Description: 81388 - WC PHYS LEVEL 3 - EST PT Modifier: Quantity: 1 CPT4 Code: Description: ICD-10 Diagnosis Description I89.0 Lymphedema, not elsewhere classified S81.802A Unspecified open wound, left lower leg, initial encounter Modifier: Quantity: Electronic Signature(s) Signed: 12/17/2020 3:44:21 PM By: Adam Shan DO Previous Signature: 12/15/2020 4:41:30 PM Version By: Adam Amen RN Entered By: Adam Merritt on 12/17/2020 15:43:58

## 2020-12-20 NOTE — Progress Notes (Signed)
NATIVIDAD, HALLS ([id number]) Visit Report for 12/17/2020 Arrival Information Details Patient Name: Adam Merritt, Adam Merritt Date of Service: 12/17/2020 10:30 AM Medical Record Number: [id number] Patient Account Number: [id number] Date of Birth/Sex: May 21, 1946 (75 y.o. M) Treating RN: Donnamarie Poag Primary Care Chaden Doom: [name] Other Clinician: Referring Jirah Rider: [name] Treating Thang Flett/Extender: Skipper Cliche in Treatment: 0 Visit Information History Since Last Visit Added or deleted any medications: No Patient Arrived: Wheel Chair Had a fall or experienced change in No Arrival Time: 10:30 activities of daily living that may affect Accompanied By: wife risk of falls: Transfer Assistance: EasyPivot Patient Lift Hospitalized since last visit: No Patient Identification Verified: Yes Has Dressing in Place as Prescribed: Yes Secondary Verification Process Completed: Yes Has Compression in Place as Prescribed: Yes Patient Has Alerts: Yes Pain Present Now: Yes Patient Alerts: DIABETIC Electronic Signature(s) Signed: 12/20/2020 2:49:51 PM By: Donnamarie Poag Entered By: Donnamarie Poag on 12/17/2020 10:33:41 Adam Merritt ([id number]) -------------------------------------------------------------------------------- Clinic Level of Care Assessment Details Patient Name: Adam Merritt Date of Service: 12/17/2020 10:30 AM Medical Record Number: [id number] Patient Account Number: [id number] Date of Birth/Sex: 1946-02-25 (75 y.o. M) Treating RN: Donnamarie Poag Primary Care Gautham Hewins: [name] Other Clinician: Referring Anaaya Fuster: [name] Treating Amaal Dimartino/Extender: Skipper Cliche in Treatment: 0 Clinic Level of Care Assessment Items TOOL 1 Quantity Score []  - Use when EandM and Procedure is performed on INITIAL visit 0 ASSESSMENTS - Nursing Assessment / Reassessment []  - General Physical Exam (combine w/ comprehensive assessment (listed just below) when performed on new 0 pt.  evals) []  - 0 Comprehensive Assessment (HX, ROS, Risk Assessments, Wounds Hx, etc.) ASSESSMENTS - Wound and Skin Assessment / Reassessment []  - Dermatologic / Skin Assessment (not related to wound area) 0 ASSESSMENTS - Ostomy and/or Continence Assessment and Care []  - Incontinence Assessment and Management 0 []  - 0 Ostomy Care Assessment and Management (repouching, etc.) PROCESS - Coordination of Care []  - Simple Patient / Family Education for ongoing care 0 []  - 0 Complex (extensive) Patient / Family Education for ongoing care []  - 0 Staff obtains Programmer, systems, Records, Test Results / Process Orders []  - 0 Staff telephones HHA, Nursing Homes / Clarify orders / etc []  - 0 Routine Transfer to another Facility (non-emergent condition) []  - 0 Routine Hospital Admission (non-emergent condition) []  - 0 New Admissions / Biomedical engineer / Ordering NPWT, Apligraf, etc. []  - 0 Emergency Hospital Admission (emergent condition) PROCESS - Special Needs []  - Pediatric / Minor Patient Management 0 []  - 0 Isolation Patient Management []  - 0 Hearing / Language / Visual special needs []  - 0 Assessment of Community assistance (transportation, D/C planning, etc.) []  - 0 Additional assistance / Altered mentation []  - 0 Support Surface(s) Assessment (bed, cushion, seat, etc.) INTERVENTIONS - Miscellaneous []  - External ear exam 0 []  - 0 Patient Transfer (multiple staff / Civil Service fast streamer / Similar devices) []  - 0 Simple Staple / Suture removal (25 or less) []  - 0 Complex Staple / Suture removal (26 or more) []  - 0 Hypo/Hyperglycemic Management (do not check if billed separately) []  - 0 Ankle / Brachial Index (ABI) - do not check if billed separately Has the patient been seen at the hospital within the last three years: Yes Total Score: 0 Level Of Care: ____ Adam Merritt ([id number]) Electronic Signature(s) Signed: 12/20/2020 2:49:51 PM By: Donnamarie Poag Entered By: Donnamarie Poag on  12/17/2020 10:43:53 Adam Merritt ([id number]) -------------------------------------------------------------------------------- Compression Therapy Details Patient Name: Adam Merritt, Adam Merritt.  Date of Service: 12/17/2020 10:30 AM Medical Record Number: [id number] Patient Account Number: [id number] Date of Birth/Sex: 1946/03/29 (75 y.o. M) Treating RN: Donnamarie Poag Primary Care Telly Broberg: [name] Other Clinician: Referring Peighton Edgin: [name] Treating Psalms Olarte/Extender: Jeri Cos [name]: 0 Compression Therapy Performed for Wound Assessment: NonWound Condition Lymphedema - Right Leg Performed By: Junius Argyle, RN Compression Type: Four Layer Electronic Signature(s) Signed: 12/20/2020 2:49:51 PM By: Donnamarie Poag Entered By: Donnamarie Poag on 12/17/2020 10:42:48 Adam Merritt ([id number]) -------------------------------------------------------------------------------- Compression Therapy Details Patient Name: Adam Merritt Date of Service: 12/17/2020 10:30 AM Medical Record Number: [id number] Patient Account Number: [id number] Date of Birth/Sex: [birth year] (75 y.o. M) Treating RN: Donnamarie Poag Primary Care Tayvian Holycross: [name] Other Clinician: Referring Giovan Pinsky: [name] Treating Yanilen Adamik/Extender: Jeri Cos [name]: 0 Compression Therapy Performed for Wound Assessment: NonWound Condition Lymphedema - Left Leg Performed By: Junius Argyle, RN Compression Type: Four Layer Electronic Signature(s) Signed: 12/20/2020 2:49:51 PM By: Donnamarie Poag Entered By: Donnamarie Poag on 12/17/2020 10:43:01 Adam Merritt ([id number]) -------------------------------------------------------------------------------- Encounter Discharge Information Details Patient Name: Adam Merritt Date of Service: 12/17/2020 10:30 AM Medical Record Number: [id number] Patient Account Number: [id number] Date of Birth/Sex: [birth year] (75 y.o. M) Treating RN: Donnamarie Poag Primary Care  Markel Kurtenbach: [name] Other Clinician: Referring Jissel Slavens: [name] Treating Vimal Derego/Extender: Skipper Cliche in Treatment: 0 Encounter Discharge Information Items Discharge Condition: Stable Ambulatory Status: Wheelchair Discharge Destination: Home Transportation: Private Auto Accompanied By: wife Schedule Follow-up Appointment: Yes Clinical Summary of Care: Electronic Signature(s) Signed: 12/20/2020 2:49:51 PM By: Donnamarie Poag Entered ByDonnamarie Poag on 12/17/2020 10:43:46

## 2020-12-23 ENCOUNTER — Other Ambulatory Visit: Payer: Self-pay

## 2020-12-23 ENCOUNTER — Encounter: Payer: Medicare PPO | Admitting: Physician Assistant

## 2020-12-23 DIAGNOSIS — I110 Hypertensive heart disease with heart failure: Secondary | ICD-10-CM | POA: Diagnosis not present

## 2020-12-23 DIAGNOSIS — L97829 Non-pressure chronic ulcer of other part of left lower leg with unspecified severity: Secondary | ICD-10-CM | POA: Diagnosis not present

## 2020-12-23 DIAGNOSIS — E11622 Type 2 diabetes mellitus with other skin ulcer: Secondary | ICD-10-CM | POA: Diagnosis not present

## 2020-12-23 DIAGNOSIS — I509 Heart failure, unspecified: Secondary | ICD-10-CM | POA: Diagnosis not present

## 2020-12-23 DIAGNOSIS — L97929 Non-pressure chronic ulcer of unspecified part of left lower leg with unspecified severity: Secondary | ICD-10-CM | POA: Diagnosis not present

## 2020-12-23 DIAGNOSIS — L97819 Non-pressure chronic ulcer of other part of right lower leg with unspecified severity: Secondary | ICD-10-CM | POA: Diagnosis not present

## 2020-12-23 DIAGNOSIS — L97919 Non-pressure chronic ulcer of unspecified part of right lower leg with unspecified severity: Secondary | ICD-10-CM | POA: Diagnosis not present

## 2020-12-23 DIAGNOSIS — I872 Venous insufficiency (chronic) (peripheral): Secondary | ICD-10-CM | POA: Diagnosis not present

## 2020-12-23 DIAGNOSIS — I890 Lymphedema, not elsewhere classified: Secondary | ICD-10-CM | POA: Diagnosis not present

## 2020-12-23 DIAGNOSIS — S99921A Unspecified injury of right foot, initial encounter: Secondary | ICD-10-CM | POA: Diagnosis not present

## 2020-12-23 DIAGNOSIS — Z794 Long term (current) use of insulin: Secondary | ICD-10-CM | POA: Diagnosis not present

## 2020-12-23 NOTE — Progress Notes (Addendum)
Adam Merritt ([id number]) Visit Report for 12/23/2020 Chief Complaint Document Details Patient Name: Adam Merritt Date of Service: 12/23/2020 11:30 AM Medical Record Number: [id number] Patient Account Number: [id number] Date of Birth/Sex: 06/28/1945 (75 y.o. M) Treating RN: Dolan Amen Primary Care Provider: [name] Other Clinician: Referring Provider: [name] Treating Provider/Extender: Skipper Cliche in Treatment: 1 Information Obtained from: Patient Chief Complaint Bilateral LE edema and weeping Electronic Signature(s) Signed: 12/23/2020 11:43:39 AM By: Worthy Keeler PA-C Entered By: Worthy Keeler on 12/23/2020 11:43:39 Adam Merritt ([id number]) -------------------------------------------------------------------------------- HPI Details Patient Name: Adam Merritt Date of Service: 12/23/2020 11:30 AM Medical Record Number: [id number] Patient Account Number: [id number] Date of Birth/Sex: [birth year] (75 y.o. M) Treating RN: Dolan Amen Primary Care Provider: [name] Other Clinician: Referring Provider: [name] Treating Provider/Extender: Skipper Cliche in Treatment: 1 History of Present Illness HPI Description: 10/18/17-He is here for initial evaluation of bilateral lower extremity ulcerations in the presence of venous insufficiency and lymphedema. He has been seen by vascular medicine in the past, Dr. Lucky Cowboy, last seen in 2016. He does have a history of abnormal ABIs, which is to be expected given his lymphedema and venous insufficiency. According to Epic, it appears that all attempts for arterial evaluation and/or angiography were not follow through with by patient. He does have a history of being seen in lymphedema clinic in 2018, stopped going approximately 6 months ago stating "it didn't do any good". He does not have lymphedema pumps, he does not have custom fit compression wrap/stockings. He is diabetic and his recent A1c last month was 7.6. He  admits to chronic bilateral lower extremity pain, no change in pain since blister and ulceration development. He is currently being treated with Levaquin for bronchitis. He has home health and we will continue. 10/25/17-He is here in follow-up evaluation for bilateral lower extremity ulcerationssubtle he remains on Levaquin for bronchitis. Right lower extremity with no evidence of drainage or ulceration, persistent left lower extremity ulceration. He states that home health has not been out since his appointment. He went to [facility] on Tuesday, studies revealed: RIGHT ABI 0.9, TBI 0.6 LEFT ABI 1.1, TBI 0.6 with triphasic flow bilaterally. We will continue his same treatment plan. He has been educated on compression therapy and need for elevation. He will benefit from lymphedema pumps 11/01/17-He is here in follow-up evaluation for left lower extremity ulcer. The right lower extremity remains healed. He has home health services, but they have not been out to see the patient for 2-3 weeks. He states it home health physical therapy changed his dressing yesterday after therapy; he placed Ace wrap compression. We are still waiting for lymphedema pumps, reordered d/t need for company change. 11/08/17-He is here in follow-up evaluation for left lower extremity ulcer. It is improved. Edema is significantly improved with compression therapy. We will continue with same treatment plan and he will follow-up next week. No word regarding lymphedema pumps 11/15/17-He is here in follow-up evaluation for left lower extremity ulcer. He is healed and will be discharged from wound care services. I have reached out to [facility] regarding his lymphedema pumps. They have been unable to reach the patient; the contact number they had with the patient's wife's cell phone and she has not answered any unrecognized calls. Contact should be made today, trial planned for next week; [facility] will  continue to follow 11/27/17 on evaluation today patient has multiple blistered areas over the right lower  extremity his left lower extremity appears to be doing okay. These blistered areas show signs of no infection which is great news. With that being said he did have some necrotic skin overlying which was mechanically debrided away with saline and gauze today without complication. Overall post debridement the wounds appear to be doing better but in general his swelling seems to be increased. This is obviously not good news. I think this is what has given rise to the blisters. 12/04/17 on evaluation today patient presents for follow-up concerning his bilateral lower extremity edema in the right lower extremity ulcers. He has been tolerating the dressing changes without complication. With that being said he has had no real issues with the wraps which is also good news. Overall I'm pleased with the progress he's been making. 12/11/17 on evaluation today patient appears to be doing rather well in regard to his right lateral lower extremity ulcer. He's been tolerating the dressing changes without complication. Fortunately there does not appear to be any evidence of infection at this time. Overall I'm pleased with the progress that is being made. Unfortunately he has been in the hospital due to having what sounds to be a stomach virus/flu fortunately that is starting to get better. 12/18/17 on evaluation today patient actually appears to be doing very well in regard to his bilateral lower extremities the swelling is under fairly good control his lymphedema pumps are still not up and running quite yet. With that being said he does have several areas of opening noted as far as wounds are concerned mainly over the left lower extremity. With that being said I do believe once he gets lymphedema pumps this would least help you mention some the fluid and preventing this from occurring. Hopefully that will be set up  soon sleeves are Artie in place at his home he just waiting for the machine. 12/25/17 on evaluation today patient actually appears to be doing excellent in fact all of his ulcers appear to have resolved his legs appear very well. I do think he needs compression stockings we have discussed this and they are actually going to go to [facility] today to elastic therapy to get this fitted for him. I think that is definitely a good thing to do. Readmission: 04/09/18 upon evaluation today this patient is seen for readmission due to bilateral lower extremity lymphedema. He has significant swelling of his extremities especially on the left although the right is also swollen he has weeping from both sides. There are no obvious open wounds at this point. Fortunately he has been doing fairly well for quite a bit of time since I last saw him. Nonetheless unfortunately this seems to have reopened and is giving quite a bit of trouble. He states this began about a week ago when he first called [place] to get in to be seen. No fevers, chills, nausea, or vomiting noted at this time. He has not been using his lymphedema pumps due to the fact that they won't fit on his leg at this point likewise is also not been using his compression for essentially the same reason. 04/16/18 upon evaluation today patient actually appears to be doing a little better in regard to the fluid in his bilateral lower extremities. With that being said he's had three falls since I saw him last week. He also states that he's been feeling very poorly. I was concerned last week and feel like that the concern is still there as far as the congestion in  his chest is concerned he seems to be breathing about the same as last week but again he states he's very weak he's not even able to walk further than from the chair to the door. His wife had to buy a wheelchair just to be able to get them out of the house to get to the appointment today. This has me very  concerned. 04/23/18 on evaluation today patient actually appears to be doing much better than last week's evaluation. At that time actually had to transport him to the ER via EMS and he subsequently was admitted for acute pulmonary edema, acute renal failure, and acute congestive heart failure. Fortunately he is doing much better. Apparently they did dialyze him and were able to take off roughly 35 pounds of fluid. Nonetheless he is feeling much better both in regard to his breathing and he's able to get around much better at this time compared to previous. Overall I'm very DEVARIUS, NELLES ([id number]) happy with how things are at this time. There does not appear to be any evidence of infection currently. No fevers, chills, nausea, or vomiting noted at this time. 04/30/2018 patient seen today for follow-up and management of bilateral lower extremity lymphedema. He did express being more sad today than usual due to the recent loss of his dog. He states that he has been compliant with using the lymphedema pumps. However he does admit a minute over the last 2-3 days he has not been using the pumps due to the recent loss of his dog. At this time there is no drainage or open wounds to his lower extremities. The left leg edema is measuring smaller today. Still has a significant amount of edema on bilateral lower extremities With dry flaky skin. He will be referred to the lymphedema clinic for further management. Will continue 3 layer compression wraps and follow-up in 1-2 weeks.Denies any pain, fever, chairs recently. No recent falls or injuries reported during this visit. 05/07/18 on evaluation today patient actually appears to be doing very well in regard to his lower extremities in general all things considered. With that being said he is having some pain in the legs just due to the amount of swelling. He does have an area where he had a blister on the left lateral lower extremity this is open at this  point other than that there's nothing else weeping at this time. 05/14/18 on evaluation today patient actually appears to be doing excellent all things considered in regard to his lower extremities. He still has a couple areas of weeping on each leg which has continued to be the issue for him. He does have an appointment with the lymphedema clinic although this isn't until February 2020. That was the earliest they had. In the meantime he has continued to tolerate the compression wraps without complication. 05/28/18 on evaluation today patient actually appears to be doing more poorly in regard to his left lower extremity where he has a wound open at this point. He also had a fall where he subsequently injured his right great toe which has led to an open wound at the site unfortunately. He has been tolerating the dressing changes without complication in general as far as the wraps are concerned that he has not been putting any dressing on the left 1st toe ulcer site. 06/11/18 on evaluation today patient appears to be doing much worse in regard to his bilateral lower extremity ulcers. He has been tolerating the dressing changes without complication although  his legs have not been wrapped more recently. Overall I am not very pleased with the way his legs appear. I do believe he needs to be back in compression wraps he still has not received his compression wraps from the [facility] hospital as of yet. 06/18/18 on evaluation today patient actually appears to be doing significantly better than last time I saw him. He has been tolerating the compression wraps without complication in the circumferential ulcers especially appear to be doing much better. His toe ulcer on the right in regard to the great toe is better although not as good as the legs in my pinion. No fevers chills noted 07/02/18 on evaluation today patient appears to be doing much better in regard to his lower extremity ulcers. Unfortunately since I last  saw him he's had the distal portion of his right great toe if he dated it sounds as if this actually went downhill very quickly. I had only seen him a few days prior and the toe did not appear to be infected at that point subsequently became infected very rapidly and it was decided by the surgeon that the distal portion of the toe needed to be removed. The patient seems to be doing well in this regard he tells me. With that being said his lower extremities are doing better from the standpoint of the wounds although he is significantly swollen at this point. 07/09/18 on evaluation today patient appears to be doing better in regard to the wounds on his lower extremities. In fact everything is almost completely healed he is just a small area on the left posterior lower extremity that is open at this point. 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 With that being said he needs some kind of compression it does sound like he may not be wearing his compression, that is the wraps, during the entire time between when he's here visit to visit. Apparently his wife took the current one off because it began to "fall apart". 07/16/18 on evaluation today patient appears to be extremely swollen especially in regard to his left lower extremity unfortunately. He also has a new skin tear over the left lower extremity and there's a smaller area on the right lower extremity as well. Unfortunately this seems to be due in part to blistering and fluid buildup in his leg. He did get the reduction wraps that were ordered by the [facility] hospital for him to go to lymphedema clinic. With that being said his wounds on the legs have not healed to the point to where they would likely accept them as a patient lymphedema clinic currently. We need to try to  get this to heal. With that being said he's been taking his wraps off which is not doing him any favors at this point. In fact this is probably quite counterproductive compared to what needs to occur. We will likely need to increase to a four layer compression wrap and continue to also utilize elevation and he has to keep the wraps on not take them off as he's been doing currently hasn't had a wrap on since Saturday. 07/23/18 on evaluation today patient appears to be doing much better in regard to his bilateral lower extremities. In fact his left lower extremity which was the largest is actually 15 cm smaller today compared to  what it was last time he was here in our clinic. This is obviously good news after just one week. Nonetheless the differences he actually kept the wraps on during the entire week this time. That's not typical for him. I do believe he understands a little bit better now the severity of the situation and why it's important for him to keep these wraps on. 07/30/18 on evaluation today patient actually appears to be doing rather well in regard to his lower extremities. His legs are much smaller than they have been in the past and he actually has only one very small rudder superficial region remaining that is not closed on the left lateral/posterior lower extremity even this is almost completely close. I do believe likely next week he will be healed without any complications. I do think we need to continue the wraps however this seems to be beneficial for him. I also think it may be a good time for [place] to go ahead and see about getting the appointment with the lymphedema clinic which is supposed to be made for him in order to keep this moving along and hopefully get them into compression wraps that will in the end help him to remain healed. 08/06/18 on evaluation today patient actually appears to be doing very well in regard as bilateral lower extremities. In fact his wounds appear to  be completely healed at this time. 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 He is in the process of getting appointment with the lymphedema clinic we have made this referral were just waiting to hear back on the schedule time. We need to follow up on that today as well. 08/13/18 on evaluation today patient actually appears to be doing very well in regard to his bilateral lower extremities there are no open wounds at this point. We are gonna go ahead and see about ordering the Velcro compression wraps for him had a discussion with them about [place] doing it versus the [place] they feel like they can definitely afford going ahead and get the wraps themselves and they would prefer to try to avoid having to go to the lymphedema clinic if it all possible which I completely understand. As long as he has good compression I'm okay either way. 3/17//20 on evaluation today patient actually appears to be doing well in regard to his bilateral lower extremity ulcers. He has been tolerating the dressing changes without complication specifically the compression wraps. Overall is had no issues my fingers finding that I see at this point is that he is having trouble with constipation. He tells me he has not been able to go to the bathroom for about six days. He's taken to over-the- counter oral laxatives unfortunately this is not helping. He has not contacted his doctor. COYE, DAWOOD ([id number]) 08/27/18 on evaluation today patient appears to be doing fairly well in regard to his lower extremities at this point. There does not appear to be any new altars is swelling is very well controlled. We are still waiting for his Velcro compression wraps to arrive that should be sometime in the next week is Artie sent the check for these. 09/03/18 on evaluation today patient appears to be doing excellent in regard to his bilateral lower extremities which he shows no signs of  wound openings in regard to this point. He does have his Velcro compression wraps which did arrive in the mail since I saw him last week. Overall  he is doing excellent in my pinion. 09/13/18 on evaluation today patient appears to be doing very well currently in regard to the overall appearance of his bilateral lower extremities although he's a little bit more swollen than last time we saw him. At that point he been discharged without any open wounds. Nonetheless he has a small open wound on the posterior left lower extremity with some evidence of cellulitis noted as well. Fortunately I feel like he has made good progress overall with regard to his lower extremities from were things used to be. 09/20/18 on evaluation today patient actually appears to be doing much better. The erythematous lower extremity is improving wound itself which is still open appears to be doing much better as far as but appearance as well as pain is concerned overall very pleased in this regard. There's no signs of active infection at this time. 09/27/18 on evaluation today patient's wounds on the lower extremity actually appear to be doing fairly well at this time which is good news. There is no evidence of active infection currently and again is just as left lower extremity were there any wounds at this point anyway. I believe they may be completely healed but again I'm not 100% sure based on evaluation today. I think one more week of observation would likely be a good idea. 10/04/18 on evaluation today patient actually appears to be doing excellent in regard to the left lower extremity which actually appears to be completely healed as of today. Unfortunately he's been having issues with his right lower extremity have a new wound that has opened. Fortunately there's no evidence of active infection at this time which is good news. 10/10/18 upon evaluation today patient actually appears to be doing a little bit worse with the new  open area on his right posterior lower extremity. He's been tolerating the dressing changes without complication. Right now we been using his compression wraps although I think we may need to switch back to actually performing bilateral compression wraps are in the clinic. No fevers, chills, nausea, or vomiting noted at this time. 10/17/18 on evaluation today patient actually appears to be doing quite well in regard to his bilateral lower extremity ulcers. He has been tolerating the reps without complication although he would prefer not to rewrap his legs as of today. Fortunately there's no signs of active infection which is good news. No fevers, chills, nausea, or vomiting noted at this time. 10/24/18 on evaluation today patient appears to be doing very well in regard to his lower extremities. His right lower extremity is shown signs of healing and his left lower [place] though not healed appears to be improving which is excellent news. Overall very pleased with how things seem to be progressing at this time. The patient is likewise happy to hear this. His Velcro compression wraps however have not been put on properly will gonna show his wife how to do that properly today. 10/31/18 on evaluation today patient appears to be doing more poorly in regard to his left lower extremity in particular although both lower extremities actually are showing some signs of being worse than my previous evaluation. Unfortunately I'm just not sure that his compression stockings even with the use of the compression/lymphedema pumps seem to be controlling this well. Upon further questioning he tells me that he also is not able to lie flat in the bed due to his congestive heart failure and difficulty breathing. For that reason he sleeps in his recliner.  To make matters worse his recliner also cannot even hold his legs up so instead of even being somewhat elevated they pretty much hang down to the floor. This is the way he  sleeps each night which is definitely counterproductive to everything else that were attempting to do from the standpoint of controlling his fluid. Nonetheless I think that he potentially could benefit from a hospital bed although this would be something that his primary care provider would likely have to order since anything that is order on our side has to be directly related to wound care and again the hospital bed is not necessarily a direct relation to although I think it does contribute to his overall wound status on his lower extremities. 11/07/18 on evaluation today patient appears to be doing better in regard to his bilateral lower extremity. He's been tolerating the dressing changes without complication. We did in the interim since I last saw him switch to just using extras orbiting new alginate and that seems to have done much better for him. I'm very pleased with the overall progress that is made. 11/14/18 on evaluation today patient appears to be redoing rather well in regard to his left lower extremity ulcers which are the only ones remaining at this point. Fortunately there's no signs of active infection at this time which is good news. Overall been very pleased with how things seem to be progressing currently. No fevers, chills, nausea, or vomiting noted at this time. 11/20/17 on evaluation today patient actually appears to be doing quite well in regard to his lower extremities on the left on the right he has several blisters that showed up although there's some question about whether or not he has had his broker compression wraps on like he was supposed to or not. Fortunately there's no signs of active infection at this time which is good news. Unfortunately though he is doing well from the standpoint of the left leg the right leg is not doing as well again this is when we did not wrap last week. 11/28/18 on evaluation today patient appears to be doing well in regard to his bilateral lower  extremities is left appears to be healed is right is not healed but is very close to being so. Overall very pleased with how things seem to be progressing. Patient is likewise happy that things are doing well. 12/09/18 on evaluation today patient actually appears to be completely healed which is excellent news. He actually seems to be doing well in regard to the swelling of the bilateral lower extremities which is also great news. Overall very pleased with how things seem to be progressing. Readmission: 03/18/2019 upon evaluation today patient presents for reevaluation concerning issues that he is having with his left lower extremity where he does have a wound noted upon inspection today. He also has a wound on the right second toe on the tip where it is apparently been rubbing in his shoe I did look at issues and you can actually see where this has been occurring as well. Fortunately there is no signs of infection with regard to the toe necessarily although it is very swollen compared to normal that does have me somewhat concerned about the possibility of further evaluation for infection/osteomyelitis. I would recommend an x-ray to start with. Otherwise he states that it is been 1-2 weeks that he has had the draining in regard to left lower extremity he does not know how this happened he has been wearing his compression  appropriately and I do see that as well today but nonetheless I do think that he needs to continue to be very cautious with regard to elevation as well. NOE, GOYER ([id number]) 03/25/2019 on evaluation today patient appears to be doing much better in regard to his lower extremity at this time. That is on the left. He did culture positive for Pseudomonas but the good news is he seems to be doing much better the gentamicin we applied topically seems to have done a great job for him. There is no signs of active infection at this time systemically and locally he is doing much better.  No fevers, chills, nausea, vomiting, or diarrhea. In regard to his right toe this seems to be doing much better there is very little pressure noted at this point I did clean up some of the callus today around the edges of the wound as well as the surface of the wound but to be honest he is progressing quite nicely. 10/30; the area on the left anterior lower tibia area is healed over. His edema control is adequate even though his use of the compression pumps seems very intermittent. He has a juxta lite stocking on the right leg and a think there is 1 for the left leg at home. His wife is putting these on. He has an area on the plantar right second toe which is a hammertoe they are trying to offload this 04/08/2019 on evaluation today patient appears to be doing well in regard to his lower extremities which is good news. We are seeing him today for his toe ulcer which is giving him a little bit more trouble but again still seems to be doing better at this point which is good news. He is going require some sharp debridement in regard to the toe today however. 04/15/2019 on evaluation today patient actually appears to be doing quite well with regard to his toe ulcer. I am very pleased with how things are doing in that regard. With regard to his lower extremity edema in general he tells me he is not been using the lymphedema pumps regularly although he does not use them. I think that he needs to use them more regularly he does have a small blister on the left lower extremity this is not open at this point but obviously it something that can get worse if he does not keep the compression going and the pumps going as well. 04/22/2019 on evaluation today patient appears to be doing well with regard to his toe ulcer. He has been tolerating the dressing changes without complication. This is measuring slightly smaller this week as compared to last week. Fortunately there is no signs of active infection at this  time. 05/06/2019 on evaluation today patient actually appears to be doing excellent. In fact his toe appears to be completely healed which is great news. There is no signs of active infection at this time. Readmission: Patient presents today for follow-up after having been discharged at the beginning of December 2020. He states that in recent weeks things have reopened and has been having more trouble at this point. With that being said fortunately there is no signs of active infection at this time. No fever chills noted. Nonetheless he also does have an issue with one of his toes as well that seems to be somewhat this is on the right foot second toe. 07/25/2019 upon evaluation today patient's lower extremities appear to be doing excellent bilaterally. I really feel  like he is showing signs of great improvement and in fact I think he is close to healing which is great news. There is no signs of active infection at this time. No fevers, chills, nausea, vomiting, or diarrhea. 07/31/2019 upon evaluation today patient appears to be doing excellent and in fact I think may be completely healed in regard to his right lower extremity that were still to monitor this 1 more week before closing it out. The left lower extremity is measuring smaller although not completely closed seems to be doing excellent. 08/07/2019 upon evaluation today patient appears to be doing excellent in regard to his wounds. In fact the right lower extremity is completely healed there is no issues here. The left lower extremity has 1 very small area still remaining fortunately there is no signs of infection and overall I think he is very close to closure of the here as well. 08/14/19 upon evaluation today patient appears to be doing excellent in regard to his lower extremities. In fact he appears to be completely healed as of today. Fortunately there's no signs of active infection at this time. No fevers, chills, nausea, or vomiting noted  at this time. READMISSION 09/26/2019 This is a 75 year old man who is well-known to this clinic having just been discharged on 08/14/2019. He has known lymphedema and chronic venous insufficiency. He has Farrow wrap stockings and also external compression pumps. He does not use the external compression pumps however. He does however apparently fairly faithfully use the stockings. They have not been lotion in his legs. He has developed new wounds on the right anterior as well as the left medial left lateral and left posterior calf. He returns for our review of this Past medical history includes coronary artery disease, hypertension, congestive heart failure, COPD, venous insufficiency with lymphedema, type 2 diabetes. He apparently has compression pumps, Farrow wraps and at 1 point a hospital bed although I believe he sleeps in a recliner 10/03/2019 upon evaluation today patient appears to be doing better with regard to his wounds in general. He has been tolerating the dressing changes without complication. Fortunately there is no signs of active infection. No fevers, chills, nausea, vomiting, or diarrhea. I do believe the compression wraps are helping as they always have in the past. 10/10/2019 upon evaluation today patient appears to be doing excellent at this point. His right lower extremity is measuring much better and in fact is completely healed. His left lower extremity is also measuring better though not healed seems to be doing excellent at this point which is great news. Overall I am very pleased with how things appear currently. 10/27/2019 upon evaluation today patient appears to be doing quite well with regard to his wounds currently. He has been tolerating the dressing changes without complication. Fortunately there is no signs of active infection at this time. In fact he is almost completely healed on the left and has just a very small area open and on the right he is completely  healed. 11/04/2019 upon evaluation today patient appears to be doing quite well with regard to his wounds. In fact he appears to be quite possibly completely healed on the left lower extremity now as well the right lower extremity is still doing well. With that being said unfortunately though this may be healed I think it is a very fragile area and I cannot even confirm there is not a very small opening still remaining. I really think he may benefit from 1 additional weeks  wrapping before discontinuing. 11/10/2019 upon evaluation today patient appears to be doing well in regard to his original wounds in fact everything that we were treating last week is completely healed on the left. Unfortunately he has a new skin tear just above where the wrap slipped down due to a fall he sustained causing this injury. 11/17/2019 on evaluation today patient appears to be doing better with regard to his wound. He has been tolerating the dressing changes without KAREE, CHRISTOPHERSON. ([id number]) complication. Fortunately there is no signs of active infection at this time. No fevers, chills, nausea, vomiting, or diarrhea. 11/24/2019 upon evaluation today patient appears to be doing well with regard to his wound. This is making good progress there does not appear to be signs of active infection but overall I do feel like he is headed in the correct direction. Overall he is tolerating the compression wrap as well. 12/02/2019 upon evaluation today patient appears to be doing well with regard to his leg ulcer. In fact this appears to be completely healed and this is the last of the wounds that he had but still the left anterior lower extremity. Fortunately there is no signs of active infection at this time. No fevers, chills, nausea, vomiting, or diarrhea. Readmission: 02/05/2020 on evaluation today patient appears to be doing well with regard to his wound all things considered. He actually tells me that a couple weeks ago he had  trouble with his wraps and therefore took him off for a couple of days in order to give his legs a break. It was during this time that he actually developed increased swelling and edema of his legs and blisters on both legs. That is when he called to make the appointment. Nonetheless since that time he began wearing his compression wraps which are Velcro in the meantime and has done extremely well with this. In fact his leg appears to be under good control as far as edema is concerned today it is nothing like what I am seeing when he has come in for evaluations in the past. They have been using some of the silver alginate at home which has been beneficial for him. Fortunately there is no signs of active infection at this time. No fevers, chills, nausea, vomiting, or diarrhea. 02/19/2020 on evaluation today patient unfortunately does not appear to be doing quite as well as I would like to see. He has no signs of active infection at this time but unfortunately he is not doing well in regard to the overall appearance of his left leg. He has a couple other areas that are weeping now and the leg is much more swollen. I think that he needs to actually have a compression wrap to try to manage this. 02/26/2020 on evaluation today patient appears to be doing well with regard to his left lower extremity ulcer. Fortunately the swelling is down I do believe the pressure wrap seems to be doing quite well. Fortunately there is no signs of active infection at this time. 03/05/2020 upon evaluation today patient appears to be doing excellent in regard to his legs at this point. Fortunately there is no signs of active infection which is great news. Overall he feels like he is completely healed which is great news. Readmission: 05/20/2020 upon evaluation today patient appears to be doing well with regard to his leg ulcer all things considered. He is actually coming back to see [place] after having had a reopening of the ulcers  on his left  leg in the past several weeks. He is out of dressing supplies and his wife is been try to take care of this as best she can. 06/10/2020 upon evaluation today patient unfortunately appears to be doing significantly worse today compared to when I last saw him. He and his wife both tell me that "there has been a lot going on". I did not actually go into detail about everything that has been happening but nonetheless he has not obviously been wearing his compression. He brings them with him today but they were not on. I am not even certain to be honest that he could get those on at this point. Nonetheless I do believe that he is going to require compression wrapping at some point shortly but right now we need to try to get what appears to be infection under control at this time. 06/28/2020 upon evaluation today patient appears to be doing well with regard to 06/28/2020 upon evaluation today patient appears to be doing well pretty well in regard to his left leg which is much better than previous. With that being said in regard to his right leg he does seem to be having some issues with a new wound after he sustained a fall. This fortunately is not too deep but does appear to be something we need to address. Neither deep which is good. 07/05/2020 on evaluation today patient appears to be doing well with regard to his wounds. In fact everything on the left leg appears to be pretty much healed on the right leg this is very close though not completely closed yet. Fortunately there is no evidence of active infection at this time. No fevers, chills, nausea, vomiting, or diarrhea. 07/13/2019 upon evaluation today patient appears to be making good progress which is great news. Overall I am extremely pleased with where things stand today. There does not appear to be any signs of active infection which is great news and overall his left leg appears healed still the right leg though not healed does seem to be  making good progress which is excellent news. He is having no pain. 07/19/2020 on evaluation today patient appears to be doing well with regard to his wound. He has been tolerating the dressing changes without complication. Fortunately there does not appear to be any signs of active infection at this time. No fevers, chills, nausea, vomiting, or diarrhea. Readmission: 08/20/2020 on evaluation today patient presents for reevaluation here in the clinic concerning issues that he has been having with his bilateral lower extremities. Unfortunately this is an ongoing reoccurring issue. He tells me that he is really not certain exactly what caused the issue although as I discussed this further with him it appears that he has been sitting for the most part and sleeping in his chair with his feet on the ground he is not really elevating at all in fact the chair does not even have a reclining function therefore it is basically just him sitting with his feet on the ground. I think this alone has probably led to his increased swelling he is also having more difficulty walking which means that he is pumping less blood flow through his legs anyway as far as the venous stasis is concerned. All in all I think this is leading to a downward spiral of him not being able to walk very well as his legs are so heavy, they begin to weep, and overall he just does not seem to be doing as well as it  was when I last saw him. 08/27/2020 upon evaluation today patient's legs actually are showing signs of improvement which is great news. There does not appear to be any evidence of infection which is also excellent news. I am extremely pleased with where things stand today. No fevers, chills, nausea, vomiting, or diarrhea. 09/03/20 upon evaluation today patient appears to be doing excellent in regard to his wounds currently. He has been tolerating the dressing changes without complication and overall I am extremely pleased with where  things stand today. No fevers, chills, nausea, vomiting, or diarrhea. Overall he is healed on the right leg although the left leg not being completely healed still is doing significantly better. 09/10/2020/8/22 upon evaluation today patient appears to be doing well with regard to his wounds. He has been tolerating the dressing changes without complication. Fortunately he appears to be completely healed. I am very pleased in that regard. As is the patient his right leg is also doing well he did get a new recliner which is helping him keep his feet off the ground which I think is helped he is down 2 cm on the left compared to last week so I think there is some definite improvement here. RUTLEDGE, SELSOR ([id number]) Readmission 12/15/20: Mr. Jaymz Traywick is a 75 year old male with a past medical history of lymphedema, insulin-dependent type 2 diabetes, COPD and congestive heart failure that presents to the clinic for increased drainage from his legs bilaterally, Left greater than right. Patient has lymphedema. He has been seen in our clinic on multiple occasions for this issue. He currently denies signs of infection. He has not been using his lymphedema pumps. 12/23/2020 upon evaluation today patient appears to be doing well with regard to his wounds. He has been tolerating the dressing changes he really does not have any open wounds just weeping areas of the legs bilaterally and fortunately seems to be doing better at this point. Electronic Signature(s) Signed: 12/23/2020 11:58:43 AM By: Worthy Keeler PA-C Entered By: Worthy Keeler on 12/23/2020 11:58:43 Adam Merritt ([id number]) -------------------------------------------------------------------------------- Physical Exam Details Patient Name: Adam Merritt Date of Service: 12/23/2020 11:30 AM Medical Record Number: [id number] Patient Account Number: [id number] Date of Birth/Sex: Oct 25, 1945 (75 y.o. M) Treating RN: Dolan Amen Primary Care  Provider: [name] Other Clinician: Referring Provider: [name] Treating Provider/Extender: Jeri Cos [name]: 1 Constitutional Well-nourished and well-hydrated in no acute distress. Respiratory normal breathing without difficulty. Psychiatric this patient is able to make decisions and demonstrates good insight into disease process. Alert and Oriented x 3. pleasant and cooperative. Notes Upon inspection patient's wound bed actually showed signs of good granulation epithelization at this point. I do not see any signs of infection which is great and overall very pleased with where we stand currently. No fevers, chills, nausea, vomiting, or diarrhea. Electronic Signature(s) Signed: 12/23/2020 11:59:00 AM By: Worthy Keeler PA-C Entered By: Worthy Keeler on 12/23/2020 11:59:00 Adam Merritt ([id number]) -------------------------------------------------------------------------------- Physician Orders Details Patient Name: Adam Merritt Date of Service: 12/23/2020 11:30 AM Medical Record Number: [id number] Patient Account Number: [id number] Date of Birth/Sex: 12/28/1945 (75 y.o. M) Treating RN: Dolan Amen Primary Care Provider: [name] Other Clinician: Referring Provider: [name] Treating Provider/Extender: Skipper Cliche in Treatment: 1 Verbal / Phone Orders: No Diagnosis Coding ICD-10 Coding Code Description I89.0 Lymphedema, not elsewhere classified Follow-up Appointments o Return Appointment in 1 week. o Nurse Visit as needed Bathing/ Shower/ Hygiene o May shower with wound  dressing protected with water repellent cover or cast protector. o No tub bath. Edema Control - Lymphedema / Segmental Compressive Device / Other Bilateral Lower Extremities o 4 Layer Compression System Lymphedema. - silvercell on wet areas followed by xsorb o Elevate legs to the level of the heart and pump ankles as often as possible o Elevate leg(s)  parallel to the floor when sitting. o Compression Pump: Use compression pump on left lower extremity for 60 minutes, twice daily. o Compression Pump: Use compression pump on right lower extremity for 60 minutes, twice daily. o DO YOUR BEST to sleep in the bed at night. DO NOT sleep in your recliner. Long hours of sitting in a recliner leads to swelling of the legs and/or potential wounds on your backside. Electronic Signature(s) Signed: 12/23/2020 4:30:35 PM By: Dolan Amen RN Signed: 12/23/2020 5:27:10 PM By: Worthy Keeler PA-C Entered By: Dolan Amen on 12/23/2020 11:51:44 Adam Merritt ([id number]) -------------------------------------------------------------------------------- Problem List Details Patient Name: Adam Merritt Date of Service: 12/23/2020 11:30 AM Medical Record Number: [id number] Patient Account Number: [id number] Date of Birth/Sex: 1946-02-27 (75 y.o. M) Treating RN: Dolan Amen Primary Care Provider: [name] Other Clinician: Referring Provider: [name] Treating Provider/Extender: Skipper Cliche in Treatment: 1 Active Problems ICD-10 Encounter Code Description Active Date MDM Diagnosis I89.0 Lymphedema, not elsewhere classified 12/15/2020 No Yes Inactive Problems Resolved Problems Electronic Signature(s) Signed: 12/23/2020 11:43:31 AM By: Worthy Keeler PA-C Entered By: Worthy Keeler on 12/23/2020 11:43:31 Adam Merritt ([id number]) -------------------------------------------------------------------------------- Progress Note Details Patient Name: Adam Merritt Date of Service: 12/23/2020 11:30 AM Medical Record Number: [id number] Patient Account Number: [id number] Date of Birth/Sex: 1945-11-08 (75 y.o. M) Treating RN: Dolan Amen Primary Care Provider: [name] Other Clinician: Referring Provider: [name] Treating Provider/Extender: Skipper Cliche in Treatment: 1 Subjective Chief Complaint Information  obtained from Patient Bilateral LE edema and weeping History of Present Illness (HPI) 10/18/17-He is here for initial evaluation of bilateral lower extremity ulcerations in the presence of venous insufficiency and lymphedema. He has been seen by vascular medicine in the past, Dr. Lucky Cowboy, last seen in 2016. He does have a history of abnormal ABIs, which is to be expected given his lymphedema and venous insufficiency. According to Epic, it appears that all attempts for arterial evaluation and/or angiography were not follow through with by patient. He does have a history of being seen in lymphedema clinic in 2018, stopped going approximately 6 months ago stating "it didn't do any good". He does not have lymphedema pumps, he does not have custom fit compression wrap/stockings. He is diabetic and his recent A1c last month was 7.6. He admits to chronic bilateral lower extremity pain, no change in pain since blister and ulceration development. He is currently being treated with Levaquin for bronchitis. He has home health and we will continue. 10/25/17-He is here in follow-up evaluation for bilateral lower extremity ulcerationssubtle he remains on Levaquin for bronchitis. Right lower extremity with no evidence of drainage or ulceration, persistent left lower extremity ulceration. He states that home health has not been out since his appointment. He went to [facility] on Tuesday, studies revealed: RIGHT ABI 0.9, TBI 0.6 LEFT ABI 1.1, TBI 0.6 with triphasic flow bilaterally. We will continue his same treatment plan. He has been educated on compression therapy and need for elevation. He will benefit from lymphedema pumps 11/01/17-He is here in follow-up evaluation for left lower extremity ulcer. The right lower extremity remains  healed. He has home health services, but they have not been out to see the patient for 2-3 weeks. He states it home health physical therapy changed his dressing yesterday  after therapy; he placed Ace wrap compression. We are still waiting for lymphedema pumps, reordered d/t need for company change. 11/08/17-He is here in follow-up evaluation for left lower extremity ulcer. It is improved. Edema is significantly improved with compression therapy. We will continue with same treatment plan and he will follow-up next week. No word regarding lymphedema pumps 11/15/17-He is here in follow-up evaluation for left lower extremity ulcer. He is healed and will be discharged from wound care services. I have reached out to [facility] regarding his lymphedema pumps. They have been unable to reach the patient; the contact number they had with the patient's wife's cell phone and she has not answered any unrecognized calls. Contact should be made today, trial planned for next week; [facility] will continue to follow 11/27/17 on evaluation today patient has multiple blistered areas over the right lower extremity his left lower extremity appears to be doing okay. These blistered areas show signs of no infection which is great news. With that being said he did have some necrotic skin overlying which was mechanically debrided away with saline and gauze today without complication. Overall post debridement the wounds appear to be doing better but in general his swelling seems to be increased. This is obviously not good news. I think this is what has given rise to the blisters. 12/04/17 on evaluation today patient presents for follow-up concerning his bilateral lower extremity edema in the right lower extremity ulcers. He has been tolerating the dressing changes without complication. With that being said he has had no real issues with the wraps which is also good news. Overall I'm pleased with the progress he's been making. 12/11/17 on evaluation today patient appears to be doing rather well in regard to his right lateral lower extremity ulcer. He's been tolerating the dressing changes  without complication. Fortunately there does not appear to be any evidence of infection at this time. Overall I'm pleased with the progress that is being made. Unfortunately he has been in the hospital due to having what sounds to be a stomach virus/flu fortunately that is starting to get better. 12/18/17 on evaluation today patient actually appears to be doing very well in regard to his bilateral lower extremities the swelling is under fairly good control his lymphedema pumps are still not up and running quite yet. With that being said he does have several areas of opening noted as far as wounds are concerned mainly over the left lower extremity. With that being said I do believe once he gets lymphedema pumps this would least help you mention some the fluid and preventing this from occurring. Hopefully that will be set up soon sleeves are Artie in place at his home he just waiting for the machine. 12/25/17 on evaluation today patient actually appears to be doing excellent in fact all of his ulcers appear to have resolved his legs appear very well. I do think he needs compression stockings we have discussed this and they are actually going to go to [place] today to elastic therapy to get this fitted for him. I think that is definitely a good thing to do. Readmission: 04/09/18 upon evaluation today this patient is seen for readmission due to bilateral lower extremity lymphedema. He has significant swelling of his extremities especially on the left although the right is also  swollen he has weeping from both sides. There are no obvious open wounds at this point. Fortunately he has been doing fairly well for quite a bit of time since I last saw him. Nonetheless unfortunately this seems to have reopened and is giving quite a bit of trouble. He states this began about a week ago when he first called [place] to get in to be seen. No fevers, chills, nausea, or vomiting noted at this time. He has not been using his  lymphedema pumps due to the fact that they won't fit on his leg at this point likewise is also not been using his compression for essentially the same reason. 04/16/18 upon evaluation today patient actually appears to be doing a little better in regard to the fluid in his bilateral lower extremities. With that being said he's had three falls since I saw him last week. He also states that he's been feeling very poorly. I was concerned last week and feel like that the concern is still there as far as the congestion in his chest is concerned he seems to be breathing about the same as last week but again he states he's very weak he's not even able to walk further than from the chair to the door. His wife had to buy a wheelchair just to be able to get them out of the house to get to the appointment today. This has me very concerned. LEONTE, HORRIGAN ([id number]) 04/23/18 on evaluation today patient actually appears to be doing much better than last week's evaluation. At that time actually had to transport him to the ER via EMS and he subsequently was admitted for acute pulmonary edema, acute renal failure, and acute congestive heart failure. Fortunately he is doing much better. Apparently they did dialyze him and were able to take off roughly 35 pounds of fluid. Nonetheless he is feeling much better both in regard to his breathing and he's able to get around much better at this time compared to previous. Overall I'm very happy with how things are at this time. There does not appear to be any evidence of infection currently. No fevers, chills, nausea, or vomiting noted at this time. 04/30/2018 patient seen today for follow-up and management of bilateral lower extremity lymphedema. He did express being more sad today than usual due to the recent loss of his dog. He states that he has been compliant with using the lymphedema pumps. However he does admit a minute over the last 2-3 days he has not been using the  pumps due to the recent loss of his dog. At this time there is no drainage or open wounds to his lower extremities. The left leg edema is measuring smaller today. Still has a significant amount of edema on bilateral lower extremities With dry flaky skin. He will be referred to the lymphedema clinic for further management. Will continue 3 layer compression wraps and follow-up in 1-2 weeks.Denies any pain, fever, chairs recently. No recent falls or injuries reported during this visit. 05/07/18 on evaluation today patient actually appears to be doing very well in regard to his lower extremities in general all things considered. With that being said he is having some pain in the legs just due to the amount of swelling. He does have an area where he had a blister on the left lateral lower extremity this is open at this point other than that there's nothing else weeping at this time. 05/14/18 on evaluation today patient actually appears to be  doing excellent all things considered in regard to his lower extremities. He still has a couple areas of weeping on each leg which has continued to be the issue for him. He does have an appointment with the lymphedema clinic although this isn't until February 2020. That was the earliest they had. In the meantime he has continued to tolerate the compression wraps without complication. 05/28/18 on evaluation today patient actually appears to be doing more poorly in regard to his left lower extremity where he has a wound open at this point. He also had a fall where he subsequently injured his right great toe which has led to an open wound at the site unfortunately. He has been tolerating the dressing changes without complication in general as far as the wraps are concerned that he has not been putting any dressing on the left 1st toe ulcer site. 06/11/18 on evaluation today patient appears to be doing much worse in regard to his bilateral lower extremity ulcers. He has been  tolerating the dressing changes without complication although his legs have not been wrapped more recently. Overall I am not very pleased with the way his legs appear. I do believe he needs to be back in compression wraps he still has not received his compression wraps from the [facility] as of yet. 06/18/18 on evaluation today patient actually appears to be doing significantly better than last time I saw him. He has been tolerating the compression wraps without complication in the circumferential ulcers especially appear to be doing much better. His toe ulcer on the right in regard to the great toe is better although not as good as the legs in my pinion. No fevers chills noted 07/02/18 on evaluation today patient appears to be doing much better in regard to his lower extremity ulcers. Unfortunately since I last saw him he's had the distal portion of his right great toe if he dated it sounds as if this actually went downhill very quickly. I had only seen him a few days prior and the toe did not appear to be infected at that point subsequently became infected very rapidly and it was decided by the surgeon that the distal portion of the toe needed to be removed. The patient seems to be doing well in this regard he tells me. With that being said his lower extremities are doing better from the standpoint of the wounds although he is significantly swollen at this point. 07/09/18 on evaluation today patient appears to be doing better in regard to the wounds on his lower extremities. In fact everything is almost completely healed he is just a small area on the left posterior lower extremity that is open at this point. 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 With that being said he needs some kind of compression it does sound  like he may not be wearing his compression, that is the wraps, during the entire time between when he's here visit to visit. Apparently his wife took the current one off because it began to "fall apart". 07/16/18 on evaluation today patient appears to be extremely swollen especially in regard to his left lower extremity unfortunately. He also has a new skin tear over the left lower extremity and there's a smaller area on the right lower extremity as well. Unfortunately this seems to be  due in part to blistering and fluid buildup in his leg. He did get the reduction wraps that were ordered by the [facility] hospital for him to go to lymphedema clinic. With that being said his wounds on the legs have not healed to the point to where they would likely accept them as a patient lymphedema clinic currently. We need to try to get this to heal. With that being said he's been taking his wraps off which is not doing him any favors at this point. In fact this is probably quite counterproductive compared to what needs to occur. We will likely need to increase to a four layer compression wrap and continue to also utilize elevation and he has to keep the wraps on not take them off as he's been doing currently hasn't had a wrap on since Saturday. 07/23/18 on evaluation today patient appears to be doing much better in regard to his bilateral lower extremities. In fact his left lower extremity which was the largest is actually 15 cm smaller today compared to what it was last time he was here in our clinic. This is obviously good news after just one week. Nonetheless the differences he actually kept the wraps on during the entire week this time. That's not typical for him. I do believe he understands a little bit better now the severity of the situation and why it's important for him to keep these wraps on. 07/30/18 on evaluation today patient actually appears to be doing rather well in regard to his lower extremities. His legs  are much smaller than they have been in the past and he actually has only one very small rudder superficial region remaining that is not closed on the left lateral/posterior lower extremity even this is almost completely close. I do believe likely next week he will be healed without any complications. I do think we need to continue the wraps however this seems to be beneficial for him. I also think it may be a good time for [place] to go ahead and see about getting the appointment with the lymphedema clinic which is supposed to be made for him in order to keep this moving along and hopefully get them into compression wraps that will in the end help him to remain healed. 08/06/18 on evaluation today patient actually appears to be doing very well in regard as bilateral lower extremities. In fact his wounds appear to be completely healed at this time. 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 He is in the process of getting appointment with the lymphedema clinic we have made this referral were just waiting to hear back on the schedule time. We need to follow up on that today as well. 08/13/18 on evaluation today patient actually appears to be doing very well in regard to his bilateral lower extremities there are no open wounds at this point. We are gonna go ahead and see about ordering the Velcro compression wraps for him had a discussion with them about [place] doing it versus the [place] they feel like they can definitely afford going ahead and get the wraps themselves and they would prefer to try to avoid having to go to the lymphedema clinic if it all possible which I completely understand. As long as he has good compression I'm okay either way. HARDEEP, REETZ ([id number]) 3/17//20 on evaluation today patient actually appears to be doing well in regard to his bilateral lower extremity ulcers. He has been  tolerating the dressing changes without complication  specifically the compression wraps. Overall is had no issues my fingers finding that I see at this point is that he is having trouble with constipation. He tells me he has not been able to go to the bathroom for about six days. He's taken to over-the- counter oral laxatives unfortunately this is not helping. He has not contacted his doctor. 08/27/18 on evaluation today patient appears to be doing fairly well in regard to his lower extremities at this point. There does not appear to be any new altars is swelling is very well controlled. We are still waiting for his Velcro compression wraps to arrive that should be sometime in the next week is Artie sent the check for these. 09/03/18 on evaluation today patient appears to be doing excellent in regard to his bilateral lower extremities which he shows no signs of wound openings in regard to this point. He does have his Velcro compression wraps which did arrive in the mail since I saw him last week. Overall he is doing excellent in my pinion. 09/13/18 on evaluation today patient appears to be doing very well currently in regard to the overall appearance of his bilateral lower extremities although he's a little bit more swollen than last time we saw him. At that point he been discharged without any open wounds. Nonetheless he has a small open wound on the posterior left lower extremity with some evidence of cellulitis noted as well. Fortunately I feel like he has made good progress overall with regard to his lower extremities from were things used to be. 09/20/18 on evaluation today patient actually appears to be doing much better. The erythematous lower extremity is improving wound itself which is still open appears to be doing much better as far as but appearance as well as pain is concerned overall very pleased in this regard. There's no signs of active infection at this time. 09/27/18 on evaluation today patient's wounds on the lower extremity actually  appear to be doing fairly well at this time which is good news. There is no evidence of active infection currently and again is just as left lower extremity were there any wounds at this point anyway. I believe they may be completely healed but again I'm not 100% sure based on evaluation today. I think one more week of observation would likely be a good idea. 10/04/18 on evaluation today patient actually appears to be doing excellent in regard to the left lower extremity which actually appears to be completely healed as of today. Unfortunately he's been having issues with his right lower extremity have a new wound that has opened. Fortunately there's no evidence of active infection at this time which is good news. 10/10/18 upon evaluation today patient actually appears to be doing a little bit worse with the new open area on his right posterior lower extremity. He's been tolerating the dressing changes without complication. Right now we been using his compression wraps although I think we may need to switch back to actually performing bilateral compression wraps are in the clinic. No fevers, chills, nausea, or vomiting noted at this time. 10/17/18 on evaluation today patient actually appears to be doing quite well in regard to his bilateral lower extremity ulcers. He has been tolerating the reps without complication although he would prefer not to rewrap his legs as of today. Fortunately there's no signs of active infection which is good news. No fevers, chills, nausea, or vomiting noted at this time.  10/24/18 on evaluation today patient appears to be doing very well in regard to his lower extremities. His right lower extremity is shown signs of healing and his left lower Trinity though not healed appears to be improving which is excellent news. Overall very pleased with how things seem to be progressing at this time. The patient is likewise happy to hear this. His Velcro compression wraps however have not  been put on properly will gonna show his wife how to do that properly today. 10/31/18 on evaluation today patient appears to be doing more poorly in regard to his left lower extremity in particular although both lower extremities actually are showing some signs of being worse than my previous evaluation. Unfortunately I'm just not sure that his compression stockings even with the use of the compression/lymphedema pumps seem to be controlling this well. Upon further questioning he tells me that he also is not able to lie flat in the bed due to his congestive heart failure and difficulty breathing. For that reason he sleeps in his recliner. To make matters worse his recliner also cannot even hold his legs up so instead of even being somewhat elevated they pretty much hang down to the floor. This is the way he sleeps each night which is definitely counterproductive to everything else that were attempting to do from the standpoint of controlling his fluid. Nonetheless I think that he potentially could benefit from a hospital bed although this would be something that his primary care provider would likely have to order since anything that is order on our side has to be directly related to wound care and again the hospital bed is not necessarily a direct relation to although I think it does contribute to his overall wound status on his lower extremities. 11/07/18 on evaluation today patient appears to be doing better in regard to his bilateral lower extremity. He's been tolerating the dressing changes without complication. We did in the interim since I last saw him switch to just using extras orbiting new alginate and that seems to have done much better for him. I'm very pleased with the overall progress that is made. 11/14/18 on evaluation today patient appears to be redoing rather well in regard to his left lower extremity ulcers which are the only ones remaining at this point. Fortunately there's no signs of  active infection at this time which is good news. Overall been very pleased with how things seem to be progressing currently. No fevers, chills, nausea, or vomiting noted at this time. 11/20/17 on evaluation today patient actually appears to be doing quite well in regard to his lower extremities on the left on the right he has several blisters that showed up although there's some question about whether or not he has had his broker compression wraps on like he was supposed to or not. Fortunately there's no signs of active infection at this time which is good news. Unfortunately though he is doing well from the standpoint of the left leg the right leg is not doing as well again this is when we did not wrap last week. 11/28/18 on evaluation today patient appears to be doing well in regard to his bilateral lower extremities is left appears to be healed is right is not healed but is very close to being so. Overall very pleased with how things seem to be progressing. Patient is likewise happy that things are doing well. 12/09/18 on evaluation today patient actually appears to be completely healed which is excellent  news. He actually seems to be doing well in regard to the swelling of the bilateral lower extremities which is also great news. Overall very pleased with how things seem to be progressing. Readmission: 03/18/2019 upon evaluation today patient presents for reevaluation concerning issues that he is having with his left lower extremity where he does have a wound noted upon inspection today. He also has a wound on the right second toe on the tip where it is apparently been rubbing in his shoe I did look at issues and you can actually see where this has been occurring as well. Fortunately there is no signs of infection with regard to Adam Merritt, Adam Merritt. ([id number]) the toe necessarily although it is very swollen compared to normal that does have me somewhat concerned about the possibility of  further evaluation for infection/osteomyelitis. I would recommend an x-ray to start with. Otherwise he states that it is been 1-2 weeks that he has had the draining in regard to left lower extremity he does not know how this happened he has been wearing his compression appropriately and I do see that as well today but nonetheless I do think that he needs to continue to be very cautious with regard to elevation as well. 03/25/2019 on evaluation today patient appears to be doing much better in regard to his lower extremity at this time. That is on the left. He did culture positive for Pseudomonas but the good news is he seems to be doing much better the gentamicin we applied topically seems to have done a great job for him. There is no signs of active infection at this time systemically and locally he is doing much better. No fevers, chills, nausea, vomiting, or diarrhea. In regard to his right toe this seems to be doing much better there is very little pressure noted at this point I did clean up some of the callus today around the edges of the wound as well as the surface of the wound but to be honest he is progressing quite nicely. 10/30; the area on the left anterior lower tibia area is healed over. His edema control is adequate even though his use of the compression pumps seems very intermittent. He has a juxta lite stocking on the right leg and a think there is 1 for the left leg at home. His wife is putting these on. He has an area on the plantar right second toe which is a hammertoe they are trying to offload this 04/08/2019 on evaluation today patient appears to be doing well in regard to his lower extremities which is good news. We are seeing him today for his toe ulcer which is giving him a little bit more trouble but again still seems to be doing better at this point which is good news. He is going require some sharp debridement in regard to the toe today however. 04/15/2019 on evaluation  today patient actually appears to be doing quite well with regard to his toe ulcer. I am very pleased with how things are doing in that regard. With regard to his lower extremity edema in general he tells me he is not been using the lymphedema pumps regularly although he does not use them. I think that he needs to use them more regularly he does have a small blister on the left lower extremity this is not open at this point but obviously it something that can get worse if he does not keep the compression going and the pumps going as  well. 04/22/2019 on evaluation today patient appears to be doing well with regard to his toe ulcer. He has been tolerating the dressing changes without complication. This is measuring slightly smaller this week as compared to last week. Fortunately there is no signs of active infection at this time. 05/06/2019 on evaluation today patient actually appears to be doing excellent. In fact his toe appears to be completely healed which is great news. There is no signs of active infection at this time. Readmission: Patient presents today for follow-up after having been discharged at the beginning of December 2020. He states that in recent weeks things have reopened and has been having more trouble at this point. With that being said fortunately there is no signs of active infection at this time. No fever chills noted. Nonetheless he also does have an issue with one of his toes as well that seems to be somewhat this is on the right foot second toe. 07/25/2019 upon evaluation today patient's lower extremities appear to be doing excellent bilaterally. I really feel like he is showing signs of great improvement and in fact I think he is close to healing which is great news. There is no signs of active infection at this time. No fevers, chills, nausea, vomiting, or diarrhea. 07/31/2019 upon evaluation today patient appears to be doing excellent and in fact I think may be completely  healed in regard to his right lower extremity that were still to monitor this 1 more week before closing it out. The left lower extremity is measuring smaller although not completely closed seems to be doing excellent. 08/07/2019 upon evaluation today patient appears to be doing excellent in regard to his wounds. In fact the right lower extremity is completely healed there is no issues here. The left lower extremity has 1 very small area still remaining fortunately there is no signs of infection and overall I think he is very close to closure of the here as well. 08/14/19 upon evaluation today patient appears to be doing excellent in regard to his lower extremities. In fact he appears to be completely healed as of today. Fortunately there's no signs of active infection at this time. No fevers, chills, nausea, or vomiting noted at this time. READMISSION 09/26/2019 This is a 75 year old man who is well-known to this clinic having just been discharged on 08/14/2019. He has known lymphedema and chronic venous insufficiency. He has Farrow wrap stockings and also external compression pumps. He does not use the external compression pumps however. He does however apparently fairly faithfully use the stockings. They have not been lotion in his legs. He has developed new wounds on the right anterior as well as the left medial left lateral and left posterior calf. He returns for our review of this Past medical history includes coronary artery disease, hypertension, congestive heart failure, COPD, venous insufficiency with lymphedema, type 2 diabetes. He apparently has compression pumps, Farrow wraps and at 1 point a hospital bed although I believe he sleeps in a recliner 10/03/2019 upon evaluation today patient appears to be doing better with regard to his wounds in general. He has been tolerating the dressing changes without complication. Fortunately there is no signs of active infection. No fevers, chills, nausea,  vomiting, or diarrhea. I do believe the compression wraps are helping as they always have in the past. 10/10/2019 upon evaluation today patient appears to be doing excellent at this point. His right lower extremity is measuring much better and in fact is completely healed. His  left lower extremity is also measuring better though not healed seems to be doing excellent at this point which is great news. Overall I am very pleased with how things appear currently. 10/27/2019 upon evaluation today patient appears to be doing quite well with regard to his wounds currently. He has been tolerating the dressing changes without complication. Fortunately there is no signs of active infection at this time. In fact he is almost completely healed on the left and has just a very small area open and on the right he is completely healed. 11/04/2019 upon evaluation today patient appears to be doing quite well with regard to his wounds. In fact he appears to be quite possibly completely healed on the left lower extremity now as well the right lower extremity is still doing well. With that being said unfortunately though this may be healed I think it is a very fragile area and I cannot even confirm there is not a very small opening still remaining. I really think he may benefit from 1 additional weeks wrapping before discontinuing. AVEON, COLQUHOUN ([id number]) 11/10/2019 upon evaluation today patient appears to be doing well in regard to his original wounds in fact everything that we were treating last week is completely healed on the left. Unfortunately he has a new skin tear just above where the wrap slipped down due to a fall he sustained causing this injury. 11/17/2019 on evaluation today patient appears to be doing better with regard to his wound. He has been tolerating the dressing changes without complication. Fortunately there is no signs of active infection at this time. No fevers, chills, nausea, vomiting, or  diarrhea. 11/24/2019 upon evaluation today patient appears to be doing well with regard to his wound. This is making good progress there does not appear to be signs of active infection but overall I do feel like he is headed in the correct direction. Overall he is tolerating the compression wrap as well. 12/02/2019 upon evaluation today patient appears to be doing well with regard to his leg ulcer. In fact this appears to be completely healed and this is the last of the wounds that he had but still the left anterior lower extremity. Fortunately there is no signs of active infection at this time. No fevers, chills, nausea, vomiting, or diarrhea. Readmission: 02/05/2020 on evaluation today patient appears to be doing well with regard to his wound all things considered. He actually tells me that a couple weeks ago he had trouble with his wraps and therefore took him off for a couple of days in order to give his legs a break. It was during this time that he actually developed increased swelling and edema of his legs and blisters on both legs. That is when he called to make the appointment. Nonetheless since that time he began wearing his compression wraps which are Velcro in the meantime and has done extremely well with this. In fact his leg appears to be under good control as far as edema is concerned today it is nothing like what I am seeing when he has come in for evaluations in the past. They have been using some of the silver alginate at home which has been beneficial for him. Fortunately there is no signs of active infection at this time. No fevers, chills, nausea, vomiting, or diarrhea. 02/19/2020 on evaluation today patient unfortunately does not appear to be doing quite as well as I would like to see. He has no signs of active infection at this  time but unfortunately he is not doing well in regard to the overall appearance of his left leg. He has a couple other areas that are weeping now and the leg  is much more swollen. I think that he needs to actually have a compression wrap to try to manage this. 02/26/2020 on evaluation today patient appears to be doing well with regard to his left lower extremity ulcer. Fortunately the swelling is down I do believe the pressure wrap seems to be doing quite well. Fortunately there is no signs of active infection at this time. 03/05/2020 upon evaluation today patient appears to be doing excellent in regard to his legs at this point. Fortunately there is no signs of active infection which is great news. Overall he feels like he is completely healed which is great news. Readmission: 05/20/2020 upon evaluation today patient appears to be doing well with regard to his leg ulcer all things considered. He is actually coming back to see [place] after having had a reopening of the ulcers on his left leg in the past several weeks. He is out of dressing supplies and his wife is been try to take care of this as best she can. 06/10/2020 upon evaluation today patient unfortunately appears to be doing significantly worse today compared to when I last saw him. He and his wife both tell me that "there has been a lot going on". I did not actually go into detail about everything that has been happening but nonetheless he has not obviously been wearing his compression. He brings them with him today but they were not on. I am not even certain to be honest that he could get those on at this point. Nonetheless I do believe that he is going to require compression wrapping at some point shortly but right now we need to try to get what appears to be infection under control at this time. 06/28/2020 upon evaluation today patient appears to be doing well with regard to 06/28/2020 upon evaluation today patient appears to be doing well pretty well in regard to his left leg which is much better than previous. With that being said in regard to his right leg he does seem to be having some issues with  a new wound after he sustained a fall. This fortunately is not too deep but does appear to be something we need to address. Neither deep which is good. 07/05/2020 on evaluation today patient appears to be doing well with regard to his wounds. In fact everything on the left leg appears to be pretty much healed on the right leg this is very close though not completely closed yet. Fortunately there is no evidence of active infection at this time. No fevers, chills, nausea, vomiting, or diarrhea. 07/13/2019 upon evaluation today patient appears to be making good progress which is great news. Overall I am extremely pleased with where things stand today. There does not appear to be any signs of active infection which is great news and overall his left leg appears healed still the right leg though not healed does seem to be making good progress which is excellent news. He is having no pain. 07/19/2020 on evaluation today patient appears to be doing well with regard to his wound. He has been tolerating the dressing changes without complication. Fortunately there does not appear to be any signs of active infection at this time. No fevers, chills, nausea, vomiting, or diarrhea. Readmission: 08/20/2020 on evaluation today patient presents for reevaluation here in the  clinic concerning issues that he has been having with his bilateral lower extremities. Unfortunately this is an ongoing reoccurring issue. He tells me that he is really not certain exactly what caused the issue although as I discussed this further with him it appears that he has been sitting for the most part and sleeping in his chair with his feet on the ground he is not really elevating at all in fact the chair does not even have a reclining function therefore it is basically just him sitting with his feet on the ground. I think this alone has probably led to his increased swelling he is also having more difficulty walking which means that he  is pumping less blood flow through his legs anyway as far as the venous stasis is concerned. All in all I think this is leading to a downward spiral of him not being able to walk very well as his legs are so heavy, they begin to weep, and overall he just does not seem to be doing as well as it was when I last saw him. 08/27/2020 upon evaluation today patient's legs actually are showing signs of improvement which is great news. There does not appear to be any evidence of infection which is also excellent news. I am extremely pleased with where things stand today. No fevers, chills, nausea, vomiting, or diarrhea. 09/03/20 upon evaluation today patient appears to be doing excellent in regard to his wounds currently. He has been tolerating the dressing changes without complication and overall I am extremely pleased with where things stand today. No fevers, chills, nausea, vomiting, or diarrhea. Overall he is healed on the right leg although the left leg not being completely healed still is doing significantly better. JAHBARI, REPINSKI ([id number]) 09/10/2020/8/22 upon evaluation today patient appears to be doing well with regard to his wounds. He has been tolerating the dressing changes without complication. Fortunately he appears to be completely healed. I am very pleased in that regard. As is the patient his right leg is also doing well he did get a new recliner which is helping him keep his feet off the ground which I think is helped he is down 2 cm on the left compared to last week so I think there is some definite improvement here. Readmission 12/15/20: Mr. Jahni Nazar is a 75 year old male with a past medical history of lymphedema, insulin-dependent type 2 diabetes, COPD and congestive heart failure that presents to the clinic for increased drainage from his legs bilaterally, Left greater than right. Patient has lymphedema. He has been seen in our clinic on multiple occasions for this issue. He currently  denies signs of infection. He has not been using his lymphedema pumps. 12/23/2020 upon evaluation today patient appears to be doing well with regard to his wounds. He has been tolerating the dressing changes he really does not have any open wounds just weeping areas of the legs bilaterally and fortunately seems to be doing better at this point. Objective Constitutional Well-nourished and well-hydrated in no acute distress. Vitals Time Taken: 11:34 AM, Temperature: 98.3 F, Pulse: 71 bpm, Respiratory Rate: 20 breaths/min, Blood Pressure: 145/86 mmHg. Respiratory normal breathing without difficulty. Psychiatric this patient is able to make decisions and demonstrates good insight into disease process. Alert and Oriented x 3. pleasant and cooperative. General Notes: Upon inspection patient's wound bed actually showed signs of good granulation epithelization at this point. I do not see any signs of infection which is great and overall very pleased with  where we stand currently. No fevers, chills, nausea, vomiting, or diarrhea. Assessment Active Problems ICD-10 Lymphedema, not elsewhere classified Procedures There was a Four Layer Compression Therapy Procedure by Dolan Amen, RN. Post procedure Diagnosis Wound #: Same as Pre-Procedure Notes: pt tolerating wraps well. There was a Four Layer Compression Therapy Procedure by Dolan Amen, RN. Post procedure Diagnosis Wound #: Same as Pre-Procedure Notes: pt tolerating wraps well. Plan Follow-up Appointments: Return Appointment in 1 week. LAMONE, FERRELLI ([id number]) Nurse Visit as needed Bathing/ Shower/ Hygiene: May shower with wound dressing protected with water repellent cover or cast protector. No tub bath. Edema Control - Lymphedema / Segmental Compressive Device / Other: 4 Layer Compression System Lymphedema. - silvercell on wet areas followed by xsorb Elevate legs to the level of the heart and pump ankles as often as  possible Elevate leg(s) parallel to the floor when sitting. Compression Pump: Use compression pump on left lower extremity for 60 minutes, twice daily. Compression Pump: Use compression pump on right lower extremity for 60 minutes, twice daily. DO YOUR BEST to sleep in the bed at night. DO NOT sleep in your recliner. Long hours of sitting in a recliner leads to swelling of the legs and/or potential wounds on your backside. 1. Would recommend him going continue with the wound care measures as before and the patient is in agreement with plan this includes the use of the compression wrap or using a 4-layer compression wrap currently. 2. Also can recommend the patient have silver alginate to the open draining area. 3. I am also can recommend the patient should be using his lymphedema pumps at least 1 time a day. Obviously twice a day would be ideal but once a day is better than nothing right now has not been using it at all he tells me part of that is scheduling a part of it is that his dog tries to jump all over him with him on and subsequently if they put him away he hallows. Nonetheless I think they are just can have to break the dog of this because he has to use these pumps daily. We will see patient back for reevaluation in 1 week here in the clinic. If anything worsens or changes patient will contact our office for additional recommendations. Electronic Signature(s) Signed: 12/23/2020 11:59:44 AM By: Worthy Keeler PA-C Entered By: Worthy Keeler on 12/23/2020 11:59:44 Adam Merritt ([id number]) -------------------------------------------------------------------------------- SuperBill Details Patient Name: Adam Merritt Date of Service: 12/23/2020 Medical Record Number: [id number] Patient Account Number: [id number] Date of Birth/Sex: 1945/11/15 (75 y.o. M) Treating RN: Dolan Amen Primary Care Provider: [name] Other Clinician: Referring Provider: [name] Treating  Provider/Extender: Skipper Cliche in Treatment: 1 Diagnosis Coding ICD-10 Codes Code Description I89.0 Lymphedema, not elsewhere classified Facility Procedures CPT4: Description Modifier Quantity Code [id number] BILATERAL: Application of multi-layer venous compression system; leg (below knee), including 1 ankle and foot. Physician Procedures CPT4 Code: 4680321 Description: 22482 - WC PHYS LEVEL 3 - EST PT Modifier: Quantity: 1 CPT4 Code: Description: ICD-10 Diagnosis Description I89.0 Lymphedema, not elsewhere classified Modifier: Quantity: Electronic Signature(s) Signed: 12/23/2020 12:00:54 PM By: Worthy Keeler PA-C Entered By: Worthy Keeler on 12/23/2020 12:00:53

## 2020-12-23 NOTE — Progress Notes (Signed)
GIL, INGWERSEN ([id number]) Visit Report for 12/23/2020 Arrival Information Details Patient Name: Adam Merritt, Adam Merritt Date of Service: 12/23/2020 11:30 AM Medical Record Number: [id number] Patient Account Number: [id number] Date of Birth/Sex: [birth year] (75 y.o. M) Treating RN: Dolan Amen Primary Care Pakou Rainbow: [name] Other Clinician: Referring Dyke Weible: [name] Treating Giulian Goldring/Extender: Skipper Cliche in Treatment: 1 Visit Information History Since Last Visit Has Dressing in Place as Prescribed: Yes Patient Arrived: Wheel Chair Has Compression in Place as Prescribed: Yes Arrival Time: 11:34 Pain Present Now: No Accompanied By: wife Transfer Assistance: Manual Patient Identification Verified: Yes Secondary Verification Process Completed: Yes Patient Has Alerts: Yes Patient Alerts: DIABETIC Electronic Signature(s) Signed: 12/23/2020 4:30:35 PM By: Dolan Amen RN Entered By: Dolan Amen on 12/23/2020 11:34:50 Adam Merritt ([id number]) -------------------------------------------------------------------------------- Clinic Level of Care Assessment Details Patient Name: Adam Merritt Date of Service: 12/23/2020 11:30 AM Medical Record Number: [id number] Patient Account Number: [id number] Date of Birth/Sex: 11-05-1945 (75 y.o. M) Treating RN: Dolan Amen Primary Care Lashelle Koy: [name] Other Clinician: Referring Allin Frix: [name] Treating Litsy Epting/Extender: Skipper Cliche in Treatment: 1 Clinic Level of Care Assessment Items TOOL 1 Quantity Score []  - Use when EandM and Procedure is performed on INITIAL visit 0 ASSESSMENTS - Nursing Assessment / Reassessment []  - General Physical Exam (combine w/ comprehensive assessment (listed just below) when performed on new 0 pt. evals) []  - 0 Comprehensive Assessment (HX, ROS, Risk Assessments, Wounds Hx, etc.) ASSESSMENTS - Wound and Skin Assessment / Reassessment []  - Dermatologic / Skin Assessment  (not related to wound area) 0 ASSESSMENTS - Ostomy and/or Continence Assessment and Care []  - Incontinence Assessment and Management 0 []  - 0 Ostomy Care Assessment and Management (repouching, etc.) PROCESS - Coordination of Care []  - Simple Patient / Family Education for ongoing care 0 []  - 0 Complex (extensive) Patient / Family Education for ongoing care []  - 0 Staff obtains Programmer, systems, Records, Test Results / Process Orders []  - 0 Staff telephones HHA, Nursing Homes / Clarify orders / etc []  - 0 Routine Transfer to another Facility (non-emergent condition) []  - 0 Routine Hospital Admission (non-emergent condition) []  - 0 New Admissions / Biomedical engineer / Ordering NPWT, Apligraf, etc. []  - 0 Emergency Hospital Admission (emergent condition) PROCESS - Special Needs []  - Pediatric / Minor Patient Management 0 []  - 0 Isolation Patient Management []  - 0 Hearing / Language / Visual special needs []  - 0 Assessment of Community assistance (transportation, D/C planning, etc.) []  - 0 Additional assistance / Altered mentation []  - 0 Support Surface(s) Assessment (bed, cushion, seat, etc.) INTERVENTIONS - Miscellaneous []  - External ear exam 0 []  - 0 Patient Transfer (multiple staff / Civil Service fast streamer / Similar devices) []  - 0 Simple Staple / Suture removal (25 or less) []  - 0 Complex Staple / Suture removal (26 or more) []  - 0 Hypo/Hyperglycemic Management (do not check if billed separately) []  - 0 Ankle / Brachial Index (ABI) - do not check if billed separately Has the patient been seen at the hospital within the last three years: Yes Total Score: 0 Level Of Care: ____ Adam Merritt ([id number]) Electronic Signature(s) Signed: 12/23/2020 4:30:35 PM By: Dolan Amen RN Entered By: Dolan Amen on 12/23/2020 11:51:51 Adam Merritt ([id number]) -------------------------------------------------------------------------------- Compression Therapy Details Patient  Name: Adam Merritt Date of Service: 12/23/2020 11:30 AM Medical Record Number: [id number] Patient Account Number: [id number] Date of Birth/Sex: 04-Jan-1946 (75 y.o. M) Treating RN: Dolan Amen  Primary Care Kennadie Brenner: [name] Other Clinician: Referring Kveon Casanas: [name] Treating Wille Aubuchon/Extender: Jeri Cos [name]: 1 Compression Therapy Performed for Wound Assessment: NonWound Condition Lymphedema - Left Leg Performed By: Clinician Dolan Amen, RN Compression Type: Four Layer Post Procedure Diagnosis Same as Pre-procedure Notes pt tolerating wraps well Electronic Signature(s) Signed: 12/23/2020 4:30:35 PM By: Dolan Amen RN Entered By: Dolan Amen on 12/23/2020 11:50:40 Adam Merritt ([id number]) -------------------------------------------------------------------------------- Compression Therapy Details Patient Name: Adam Merritt Date of Service: 12/23/2020 11:30 AM Medical Record Number: [id number] Patient Account Number: [id number] Date of Birth/Sex: 1945/07/21 (75 y.o. M) Treating RN: Dolan Amen Primary Care Christain Niznik: [name] Other Clinician: Referring Auguste Tebbetts: [name] Treating Jordin Vicencio/Extender: Jeri Cos [name]: 1 Compression Therapy Performed for Wound Assessment: NonWound Condition Lymphedema - Right Leg Performed By: Clinician Dolan Amen, RN Compression Type: Four Layer Post Procedure Diagnosis Same as Pre-procedure Notes pt tolerating wraps well Electronic Signature(s) Signed: 12/23/2020 4:30:35 PM By: Dolan Amen RN Entered By: Dolan Amen on 12/23/2020 11:51:06 Adam Merritt ([id number]) -------------------------------------------------------------------------------- Encounter Discharge Information Details Patient Name: Adam Merritt Date of Service: 12/23/2020 11:30 AM Medical Record Number: [id number] Patient Account Number: [id number] Date of Birth/Sex: 1946-02-23 (75 y.o. M) Treating  RN: Dolan Amen Primary Care Deiondra Denley: [name] Other Clinician: Referring Jahna Liebert: [name] Treating Syerra Abdelrahman/Extender: Skipper Cliche in Treatment: 1 Encounter Discharge Information Items Discharge Condition: Stable Ambulatory Status: Wheelchair Discharge Destination: Home Transportation: Private Auto Accompanied By: wife Schedule Follow-up Appointment: Yes Clinical Summary of Care: Electronic Signature(s) Signed: 12/23/2020 4:30:35 PM By: Dolan Amen RN Entered By: Dolan Amen on 12/23/2020 12:07:29 Adam Merritt ([id number]) -------------------------------------------------------------------------------- Lower Extremity Assessment Details Patient Name: Adam Merritt Date of Service: 12/23/2020 11:30 AM Medical Record Number: [id number] Patient Account Number: [id number] Date of Birth/Sex: May 31, 1946 (75 y.o. M) Treating RN: Dolan Amen Primary Care Bernis Schreur: [name] Other Clinician: Referring Zyaire Mccleod: [name] Treating Kally Cadden/Extender: Jeri Cos [name]: 1 Edema Assessment Assessed: [Left: Yes] [Right: Yes] Edema: [Left: Yes] [Right: Yes] Calf Left: Right: Point of Measurement: 31 cm From Medial Instep 51.5 cm 46 cm Ankle Left: Right: Point of Measurement: 10 cm From Medial Instep 37 cm 27.5 cm Electronic Signature(s) Signed: 12/23/2020 4:30:35 PM By: Dolan Amen RN Entered By: Dolan Amen on 12/23/2020 11:46:00 Adam Merritt ([id number]) -------------------------------------------------------------------------------- Multi Wound Chart Details Patient Name: Adam Merritt Date of Service: 12/23/2020 11:30 AM Medical Record Number: [id number] Patient Account Number: [id number] Date of Birth/Sex: 1945/12/16 (75 y.o. M) Treating RN: Dolan Amen Primary Care Keniah Klemmer: [name] Other Clinician: Referring Yoni Lobos: [name] Treating Leaha Cuervo/Extender: Skipper Cliche in Treatment: 1 Vital  Signs Height(in): Pulse(bpm): 71 Weight(lbs): Blood Pressure(mmHg): 145/86 Body Mass Index(BMI): Temperature(F): 98.3 Respiratory Rate(breaths/min): 20 Wound Assessments Treatment Notes Electronic Signature(s) Signed: 12/23/2020 4:30:35 PM By: Dolan Amen RN Entered By: Dolan Amen on 12/23/2020 11:50:03 Adam Merritt ([id number]) -------------------------------------------------------------------------------- [place] Details Patient Name: Adam Merritt Date of Service: 12/23/2020 11:30 AM Medical Record Number: [id number] Patient Account Number: [id number] Date of Birth/Sex: 07-14-1945 (75 y.o. M) Treating RN: Dolan Amen Primary Care Eran Mistry: [name] Other Clinician: Referring Elishua Radford: [name] Treating Shamiyah Ngu/Extender: Skipper Cliche in Treatment: 1 Active Inactive Wound/Skin Impairment Nursing Diagnoses: Impaired tissue integrity Goals: Patient/caregiver will verbalize understanding of skin care regimen Date Initiated: 12/15/2020 Target Resolution Date: 12/15/2020 Goal Status: Active Ulcer/skin breakdown will have a volume reduction of 30% by week  4 Date Initiated: 12/15/2020 Target Resolution Date: 01/15/2021 Goal Status: Active Ulcer/skin breakdown will have a volume reduction of 50% by week 8 Date Initiated: 12/15/2020 Target Resolution Date: 02/15/2021 Goal Status: Active Ulcer/skin breakdown will have a volume reduction of 80% by week 12 Date Initiated: 12/15/2020 Target Resolution Date: 03/17/2021 Goal Status: Active Ulcer/skin breakdown will heal within 14 weeks Date Initiated: 12/15/2020 Target Resolution Date: 04/17/2021 Goal Status: Active Interventions: Assess patient/caregiver ability to obtain necessary supplies Assess patient/caregiver ability to perform ulcer/skin care regimen upon admission and as needed Assess ulceration(s) every visit Treatment Activities: Skin care regimen initiated :  12/15/2020 Notes: Electronic Signature(s) Signed: 12/23/2020 4:30:35 PM By: Dolan Amen RN Entered By: Dolan Amen on 12/23/2020 11:49:54 Adam Merritt ([id number]) -------------------------------------------------------------------------------- Pain Assessment Details Patient Name: Adam Merritt Date of Service: 12/23/2020 11:30 AM Medical Record Number: [id number] Patient Account Number: [id number] Date of Birth/Sex: [birth year] (75 y.o. M) Treating RN: Dolan Amen Primary Care March Joos: [name] Other Clinician: Referring Taralynn Quiett: [name] Treating Tariyah Pendry/Extender: Skipper Cliche in Treatment: 1 Active Problems Location of Pain Severity and Description of Pain Patient Has Paino No Site Locations Rate the pain. Current Pain Level: 0 Pain Management and Medication Current Pain Management: Electronic Signature(s) Signed: 12/23/2020 4:30:35 PM By: Dolan Amen RN Entered By: Dolan Amen on 12/23/2020 11:36:41 Adam Merritt ([id number]) -------------------------------------------------------------------------------- Patient/Caregiver Education Details Patient Name: Adam Merritt Date of Service: 12/23/2020 11:30 AM Medical Record Number: [id number] Patient Account Number: [id number] Date of Birth/Gender: Mar 30, 1946 (75 y.o. M) Treating RN: Dolan Amen Primary Care Physician: [name] Other Clinician: Referring Physician: [name] Treating Physician/Extender: Skipper Cliche in Treatment: 1 Education Assessment Education Provided To: Patient Education Topics Provided Wound/Skin Impairment: Methods: Explain/Verbal Responses: State content correctly Electronic Signature(s) Signed: 12/23/2020 4:30:35 PM By: Dolan Amen RN Entered By: Dolan Amen on 12/23/2020 11:52:09 Adam Merritt ([id number]) -------------------------------------------------------------------------------- [place] Details Patient Name: Adam Merritt Date  of Service: 12/23/2020 11:30 AM Medical Record Number: [id number] Patient Account Number: [id number] Date of Birth/Sex: Sep 02, 1945 (75 y.o. M) Treating RN: Dolan Amen Primary Care Gurbani Figge: [name] Other Clinician: Referring Ayeza Therriault: [name] Treating Averyanna Sax/Extender: Jeri Cos [name]: 1 Vital Signs Time Taken: 11:34 Temperature (F): 98.3 Pulse (bpm): 71 Respiratory Rate (breaths/min): 20 Blood Pressure (mmHg): 145/86 Reference Range: 80 - 120 mg / dl Electronic Signature(s) Signed: 12/23/2020 4:30:35 PM By: Dolan Amen RN Entered By: Dolan Amen on 12/23/2020 11:36:20

## 2020-12-28 ENCOUNTER — Encounter: Payer: Medicare PPO | Admitting: Physician Assistant

## 2020-12-28 ENCOUNTER — Other Ambulatory Visit: Payer: Self-pay

## 2020-12-28 DIAGNOSIS — L97919 Non-pressure chronic ulcer of unspecified part of right lower leg with unspecified severity: Secondary | ICD-10-CM | POA: Diagnosis not present

## 2020-12-28 DIAGNOSIS — L97829 Non-pressure chronic ulcer of other part of left lower leg with unspecified severity: Secondary | ICD-10-CM | POA: Diagnosis not present

## 2020-12-28 DIAGNOSIS — I890 Lymphedema, not elsewhere classified: Secondary | ICD-10-CM | POA: Diagnosis not present

## 2020-12-28 DIAGNOSIS — L97819 Non-pressure chronic ulcer of other part of right lower leg with unspecified severity: Secondary | ICD-10-CM | POA: Diagnosis not present

## 2020-12-28 DIAGNOSIS — L97929 Non-pressure chronic ulcer of unspecified part of left lower leg with unspecified severity: Secondary | ICD-10-CM | POA: Diagnosis not present

## 2020-12-28 DIAGNOSIS — I509 Heart failure, unspecified: Secondary | ICD-10-CM | POA: Diagnosis not present

## 2020-12-28 DIAGNOSIS — I110 Hypertensive heart disease with heart failure: Secondary | ICD-10-CM | POA: Diagnosis not present

## 2020-12-28 DIAGNOSIS — E11622 Type 2 diabetes mellitus with other skin ulcer: Secondary | ICD-10-CM | POA: Diagnosis not present

## 2020-12-28 DIAGNOSIS — S99921A Unspecified injury of right foot, initial encounter: Secondary | ICD-10-CM | POA: Diagnosis not present

## 2020-12-28 DIAGNOSIS — I872 Venous insufficiency (chronic) (peripheral): Secondary | ICD-10-CM | POA: Diagnosis not present

## 2020-12-28 DIAGNOSIS — Z794 Long term (current) use of insulin: Secondary | ICD-10-CM | POA: Diagnosis not present

## 2020-12-28 NOTE — Progress Notes (Addendum)
MARDELL, CRAGG ([id number]) Visit Report for 12/28/2020 Chief Complaint Document Details Patient Name: Adam Merritt, Adam Merritt Date of Service: 12/28/2020 12:30 PM Medical Record Number: [id number] Patient Account Number: [id number] Date of Birth/Sex: [birth year] (75 y.o. M) Treating RN: Carlene Coria Primary Care Provider: [name] Other Clinician: Referring Provider: [name] Treating Provider/Extender: Skipper Cliche in Treatment: 1 Information Obtained from: Patient Chief Complaint Bilateral LE edema and weeping Electronic Signature(s) Signed: 12/28/2020 1:16:42 PM By: Worthy Keeler PA-C Entered By: Worthy Keeler on 12/28/2020 13:16:41 Adam Merritt ([id number]) -------------------------------------------------------------------------------- HPI Details Patient Name: Adam Merritt Date of Service: 12/28/2020 12:30 PM Medical Record Number: [id number] Patient Account Number: [id number] Date of Birth/Sex: May 16, 1946 (75 y.o. M) Treating RN: Carlene Coria Primary Care Provider: [name] Other Clinician: Referring Provider: [name] Treating Provider/Extender: Skipper Cliche in Treatment: 1 History of Present Illness HPI Description: 10/18/17-He is here for initial evaluation of bilateral lower extremity ulcerations in the presence of venous insufficiency and lymphedema. He has been seen by vascular medicine in the past, Dr. Lucky Cowboy, last seen in 2016. He does have a history of abnormal ABIs, which is to be expected given his lymphedema and venous insufficiency. According to Epic, it appears that all attempts for arterial evaluation and/or angiography were not follow through with by patient. He does have a history of being seen in lymphedema clinic in 2018, stopped going approximately 6 months ago stating "it didn't do any good". He does not have lymphedema pumps, he does not have custom fit compression wrap/stockings. He is diabetic and his recent A1c last month was 7.6. He  admits to chronic bilateral lower extremity pain, no change in pain since blister and ulceration development. He is currently being treated with Levaquin for bronchitis. He has home health and we will continue. 10/25/17-He is here in follow-up evaluation for bilateral lower extremity ulcerationssubtle he remains on Levaquin for bronchitis. Right lower extremity with no evidence of drainage or ulceration, persistent left lower extremity ulceration. He states that home health has not been out since his appointment. He went to [facility] on Tuesday, studies revealed: RIGHT ABI 0.9, TBI 0.6 LEFT ABI 1.1, TBI 0.6 with triphasic flow bilaterally. We will continue his same treatment plan. He has been educated on compression therapy and need for elevation. He will benefit from lymphedema pumps 11/01/17-He is here in follow-up evaluation for left lower extremity ulcer. The right lower extremity remains healed. He has home health services, but they have not been out to see the patient for 2-3 weeks. He states it home health physical therapy changed his dressing yesterday after therapy; he placed Adam Merritt wrap compression. We are still waiting for lymphedema pumps, reordered d/t need for company change. 11/08/17-He is here in follow-up evaluation for left lower extremity ulcer. It is improved. Edema is significantly improved with compression therapy. We will continue with same treatment plan and he will follow-up next week. No word regarding lymphedema pumps 11/15/17-He is here in follow-up evaluation for left lower extremity ulcer. He is healed and will be discharged from wound care services. I have reached out to [facility] regarding his lymphedema pumps. They have been unable to reach the patient; the contact number they had with the patient's wife's cell phone and she has not answered any unrecognized calls. Contact should be made today, trial planned for next week; [facility] will  continue to follow 11/27/17 on evaluation today patient has multiple blistered areas over the right lower  extremity his left lower extremity appears to be doing okay. These blistered areas show signs of no infection which is great news. With that being said he did have some necrotic skin overlying which was mechanically debrided away with saline and gauze today without complication. Overall post debridement the wounds appear to be doing better but in general his swelling seems to be increased. This is obviously not good news. I think this is what has given rise to the blisters. 12/04/17 on evaluation today patient presents for follow-up concerning his bilateral lower extremity edema in the right lower extremity ulcers. He has been tolerating the dressing changes without complication. With that being said he has had no real issues with the wraps which is also good news. Overall I'm pleased with the progress he's been making. 12/11/17 on evaluation today patient appears to be doing rather well in regard to his right lateral lower extremity ulcer. He's been tolerating the dressing changes without complication. Fortunately there does not appear to be any evidence of infection at this time. Overall I'm pleased with the progress that is being made. Unfortunately he has been in the hospital due to having what sounds to be a stomach virus/flu fortunately that is starting to get better. 12/18/17 on evaluation today patient actually appears to be doing very well in regard to his bilateral lower extremities the swelling is under fairly good control his lymphedema pumps are still not up and running quite yet. With that being said he does have several areas of opening noted as far as wounds are concerned mainly over the left lower extremity. With that being said I do believe once he gets lymphedema pumps this would least help you mention some the fluid and preventing this from occurring. Hopefully that will be set up  soon sleeves are Artie in place at his home he just waiting for the machine. 12/25/17 on evaluation today patient actually appears to be doing excellent in fact all of his ulcers appear to have resolved his legs appear very well. I do think he needs compression stockings we have discussed this and they are actually going to go to [place] today to elastic therapy to get this fitted for him. I think that is definitely a good thing to do. Readmission: 04/09/18 upon evaluation today this patient is seen for readmission due to bilateral lower extremity lymphedema. He has significant swelling of his extremities especially on the left although the right is also swollen he has weeping from both sides. There are no obvious open wounds at this point. Fortunately he has been doing fairly well for quite a bit of time since I last saw him. Nonetheless unfortunately this seems to have reopened and is giving quite a bit of trouble. He states this began about a week ago when he first called [place] to get in to be seen. No fevers, chills, nausea, or vomiting noted at this time. He has not been using his lymphedema pumps due to the fact that they won't fit on his leg at this point likewise is also not been using his compression for essentially the same reason. 04/16/18 upon evaluation today patient actually appears to be doing a little better in regard to the fluid in his bilateral lower extremities. With that being said he's had three falls since I saw him last week. He also states that he's been feeling very poorly. I was concerned last week and feel like that the concern is still there as far as the congestion in  his chest is concerned he seems to be breathing about the same as last week but again he states he's very weak he's not even able to walk further than from the chair to the door. His wife had to buy a wheelchair just to be able to get them out of the house to get to the appointment today. This has me very  concerned. 04/23/18 on evaluation today patient actually appears to be doing much better than last week's evaluation. At that time actually had to transport him to the ER via EMS and he subsequently was admitted for acute pulmonary edema, acute renal failure, and acute congestive heart failure. Fortunately he is doing much better. Apparently they did dialyze him and were able to take off roughly 35 pounds of fluid. Nonetheless he is feeling much better both in regard to his breathing and he's able to get around much better at this time compared to previous. Overall I'm very Adam Merritt, Adam Merritt ([id number]) happy with how things are at this time. There does not appear to be any evidence of infection currently. No fevers, chills, nausea, or vomiting noted at this time. 04/30/2018 patient seen today for follow-up and management of bilateral lower extremity lymphedema. He did express being more sad today than usual due to the recent loss of his dog. He states that he has been compliant with using the lymphedema pumps. However he does admit a minute over the last 2-3 days he has not been using the pumps due to the recent loss of his dog. At this time there is no drainage or open wounds to his lower extremities. The left leg edema is measuring smaller today. Still has a significant amount of edema on bilateral lower extremities With dry flaky skin. He will be referred to the lymphedema clinic for further management. Will continue 3 layer compression wraps and follow-up in 1-2 weeks.Denies any pain, fever, chairs recently. No recent falls or injuries reported during this visit. 05/07/18 on evaluation today patient actually appears to be doing very well in regard to his lower extremities in general all things considered. With that being said he is having some pain in the legs just due to the amount of swelling. He does have an area where he had a blister on the left lateral lower extremity this is open at this  point other than that there's nothing else weeping at this time. 05/14/18 on evaluation today patient actually appears to be doing excellent all things considered in regard to his lower extremities. He still has a couple areas of weeping on each leg which has continued to be the issue for him. He does have an appointment with the lymphedema clinic although this isn't until February 2020. That was the earliest they had. In the meantime he has continued to tolerate the compression wraps without complication. 05/28/18 on evaluation today patient actually appears to be doing more poorly in regard to his left lower extremity where he has a wound open at this point. He also had a fall where he subsequently injured his right great toe which has led to an open wound at the site unfortunately. He has been tolerating the dressing changes without complication in general as far as the wraps are concerned that he has not been putting any dressing on the left 1st toe ulcer site. 06/11/18 on evaluation today patient appears to be doing much worse in regard to his bilateral lower extremity ulcers. He has been tolerating the dressing changes without complication although  his legs have not been wrapped more recently. Overall I am not very pleased with the way his legs appear. I do believe he needs to be back in compression wraps he still has not received his compression wraps from the [facility] hospital as of yet. 06/18/18 on evaluation today patient actually appears to be doing significantly better than last time I saw him. He has been tolerating the compression wraps without complication in the circumferential ulcers especially appear to be doing much better. His toe ulcer on the right in regard to the great toe is better although not as good as the legs in my pinion. No fevers chills noted 07/02/18 on evaluation today patient appears to be doing much better in regard to his lower extremity ulcers. Unfortunately since I last  saw him he's had the distal portion of his right great toe if he dated it sounds as if this actually went downhill very quickly. I had only seen him a few days prior and the toe did not appear to be infected at that point subsequently became infected very rapidly and it was decided by the surgeon that the distal portion of the toe needed to be removed. The patient seems to be doing well in this regard he tells me. With that being said his lower extremities are doing better from the standpoint of the wounds although he is significantly swollen at this point. 07/09/18 on evaluation today patient appears to be doing better in regard to the wounds on his lower extremities. In fact everything is almost completely healed he is just a small area on the left posterior lower extremity that is open at this point. 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 With that being said he needs some kind of compression it does sound like he may not be wearing his compression, that is the wraps, during the entire time between when he's here visit to visit. Apparently his wife took the current one off because it began to "fall apart". 07/16/18 on evaluation today patient appears to be extremely swollen especially in regard to his left lower extremity unfortunately. He also has a new skin tear over the left lower extremity and there's a smaller area on the right lower extremity as well. Unfortunately this seems to be due in part to blistering and fluid buildup in his leg. He did get the reduction wraps that were ordered by the [facility] hospital for him to go to lymphedema clinic. With that being said his wounds on the legs have not healed to the point to where they would likely accept them as a patient lymphedema clinic currently. We need to try to  get this to heal. With that being said he's been taking his wraps off which is not doing him any favors at this point. In fact this is probably quite counterproductive compared to what needs to occur. We will likely need to increase to a four layer compression wrap and continue to also utilize elevation and he has to keep the wraps on not take them off as he's been doing currently hasn't had a wrap on since Saturday. 07/23/18 on evaluation today patient appears to be doing much better in regard to his bilateral lower extremities. In fact his left lower extremity which was the largest is actually 15 cm smaller today compared to  what it was last time he was here in our clinic. This is obviously good news after just one week. Nonetheless the differences he actually kept the wraps on during the entire week this time. That's not typical for him. I do believe he understands a little bit better now the severity of the situation and why it's important for him to keep these wraps on. 07/30/18 on evaluation today patient actually appears to be doing rather well in regard to his lower extremities. His legs are much smaller than they have been in the past and he actually has only one very small rudder superficial region remaining that is not closed on the left lateral/posterior lower extremity even this is almost completely close. I do believe likely next week he will be healed without any complications. I do think we need to continue the wraps however this seems to be beneficial for him. I also think it may be a good time for [place] to go ahead and see about getting the appointment with the lymphedema clinic which is supposed to be made for him in order to keep this moving along and hopefully get them into compression wraps that will in the end help him to remain healed. 08/06/18 on evaluation today patient actually appears to be doing very well in regard as bilateral lower extremities. In fact his wounds appear to  be completely healed at this time. 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 He is in the process of getting appointment with the lymphedema clinic we have made this referral were just waiting to hear back on the schedule time. We need to follow up on that today as well. 08/13/18 on evaluation today patient actually appears to be doing very well in regard to his bilateral lower extremities there are no open wounds at this point. We are gonna go ahead and see about ordering the Velcro compression wraps for him had a discussion with them about [place] doing it versus the [place] they feel like they can definitely afford going ahead and get the wraps themselves and they would prefer to try to avoid having to go to the lymphedema clinic if it all possible which I completely understand. As long as he has good compression I'm okay either way. 3/17//20 on evaluation today patient actually appears to be doing well in regard to his bilateral lower extremity ulcers. He has been tolerating the dressing changes without complication specifically the compression wraps. Overall is had no issues my fingers finding that I see at this point is that he is having trouble with constipation. He tells me he has not been able to go to the bathroom for about six days. He's taken to over-the- counter oral laxatives unfortunately this is not helping. He has not contacted his doctor. Adam Merritt, Adam Merritt ([id number]) 08/27/18 on evaluation today patient appears to be doing fairly well in regard to his lower extremities at this point. There does not appear to be any new altars is swelling is very well controlled. We are still waiting for his Velcro compression wraps to arrive that should be sometime in the next week is Artie sent the check for these. 09/03/18 on evaluation today patient appears to be doing excellent in regard to his bilateral lower extremities which he shows no signs of  wound openings in regard to this point. He does have his Velcro compression wraps which did arrive in the mail since I saw him last week. Overall  he is doing excellent in my pinion. 09/13/18 on evaluation today patient appears to be doing very well currently in regard to the overall appearance of his bilateral lower extremities although he's a little bit more swollen than last time we saw him. At that point he been discharged without any open wounds. Nonetheless he has a small open wound on the posterior left lower extremity with some evidence of cellulitis noted as well. Fortunately I feel like he has made good progress overall with regard to his lower extremities from were things used to be. 09/20/18 on evaluation today patient actually appears to be doing much better. The erythematous lower extremity is improving wound itself which is still open appears to be doing much better as far as but appearance as well as pain is concerned overall very pleased in this regard. There's no signs of active infection at this time. 09/27/18 on evaluation today patient's wounds on the lower extremity actually appear to be doing fairly well at this time which is good news. There is no evidence of active infection currently and again is just as left lower extremity were there any wounds at this point anyway. I believe they may be completely healed but again I'm not 100% sure based on evaluation today. I think one more week of observation would likely be a good idea. 10/04/18 on evaluation today patient actually appears to be doing excellent in regard to the left lower extremity which actually appears to be completely healed as of today. Unfortunately he's been having issues with his right lower extremity have a new wound that has opened. Fortunately there's no evidence of active infection at this time which is good news. 10/10/18 upon evaluation today patient actually appears to be doing a little bit worse with the new  open area on his right posterior lower extremity. He's been tolerating the dressing changes without complication. Right now we been using his compression wraps although I think we may need to switch back to actually performing bilateral compression wraps are in the clinic. No fevers, chills, nausea, or vomiting noted at this time. 10/17/18 on evaluation today patient actually appears to be doing quite well in regard to his bilateral lower extremity ulcers. He has been tolerating the reps without complication although he would prefer not to rewrap his legs as of today. Fortunately there's no signs of active infection which is good news. No fevers, chills, nausea, or vomiting noted at this time. 10/24/18 on evaluation today patient appears to be doing very well in regard to his lower extremities. His right lower extremity is shown signs of healing and his left lower [place] though not healed appears to be improving which is excellent news. Overall very pleased with how things seem to be progressing at this time. The patient is likewise happy to hear this. His Velcro compression wraps however have not been put on properly will gonna show his wife how to do that properly today. 10/31/18 on evaluation today patient appears to be doing more poorly in regard to his left lower extremity in particular although both lower extremities actually are showing some signs of being worse than my previous evaluation. Unfortunately I'm just not sure that his compression stockings even with the use of the compression/lymphedema pumps seem to be controlling this well. Upon further questioning he tells me that he also is not able to lie flat in the bed due to his congestive heart failure and difficulty breathing. For that reason he sleeps in his recliner.  To make matters worse his recliner also cannot even hold his legs up so instead of even being somewhat elevated they pretty much hang down to the floor. This is the way he  sleeps each night which is definitely counterproductive to everything else that were attempting to do from the standpoint of controlling his fluid. Nonetheless I think that he potentially could benefit from a hospital bed although this would be something that his primary care provider would likely have to order since anything that is order on our side has to be directly related to wound care and again the hospital bed is not necessarily a direct relation to although I think it does contribute to his overall wound status on his lower extremities. 11/07/18 on evaluation today patient appears to be doing better in regard to his bilateral lower extremity. He's been tolerating the dressing changes without complication. We did in the interim since I last saw him switch to just using extras orbiting new alginate and that seems to have done much better for him. I'm very pleased with the overall progress that is made. 11/14/18 on evaluation today patient appears to be redoing rather well in regard to his left lower extremity ulcers which are the only ones remaining at this point. Fortunately there's no signs of active infection at this time which is good news. Overall been very pleased with how things seem to be progressing currently. No fevers, chills, nausea, or vomiting noted at this time. 11/20/17 on evaluation today patient actually appears to be doing quite well in regard to his lower extremities on the left on the right he has several blisters that showed up although there's some question about whether or not he has had his broker compression wraps on like he was supposed to or not. Fortunately there's no signs of active infection at this time which is good news. Unfortunately though he is doing well from the standpoint of the left leg the right leg is not doing as well again this is when we did not wrap last week. 11/28/18 on evaluation today patient appears to be doing well in regard to his bilateral lower  extremities is left appears to be healed is right is not healed but is very close to being so. Overall very pleased with how things seem to be progressing. Patient is likewise happy that things are doing well. 12/09/18 on evaluation today patient actually appears to be completely healed which is excellent news. He actually seems to be doing well in regard to the swelling of the bilateral lower extremities which is also great news. Overall very pleased with how things seem to be progressing. Readmission: 03/18/2019 upon evaluation today patient presents for reevaluation concerning issues that he is having with his left lower extremity where he does have a wound noted upon inspection today. He also has a wound on the right second toe on the tip where it is apparently been rubbing in his shoe I did look at issues and you can actually see where this has been occurring as well. Fortunately there is no signs of infection with regard to the toe necessarily although it is very swollen compared to normal that does have me somewhat concerned about the possibility of further evaluation for infection/osteomyelitis. I would recommend an x-ray to start with. Otherwise he states that it is been 1-2 weeks that he has had the draining in regard to left lower extremity he does not know how this happened he has been wearing his compression  appropriately and I do see that as well today but nonetheless I do think that he needs to continue to be very cautious with regard to elevation as well. Adam Merritt, Adam Merritt ([id number]) 03/25/2019 on evaluation today patient appears to be doing much better in regard to his lower extremity at this time. That is on the left. He did culture positive for Pseudomonas but the good news is he seems to be doing much better the gentamicin we applied topically seems to have done a great job for him. There is no signs of active infection at this time systemically and locally he is doing much better.  No fevers, chills, nausea, vomiting, or diarrhea. In regard to his right toe this seems to be doing much better there is very little pressure noted at this point I did clean up some of the callus today around the edges of the wound as well as the surface of the wound but to be honest he is progressing quite nicely. 10/30; the area on the left anterior lower tibia area is healed over. His edema control is adequate even though his use of the compression pumps seems very intermittent. He has a juxta lite stocking on the right leg and a think there is 1 for the left leg at home. His wife is putting these on. He has an area on the plantar right second toe which is a hammertoe they are trying to offload this 04/08/2019 on evaluation today patient appears to be doing well in regard to his lower extremities which is good news. We are seeing him today for his toe ulcer which is giving him a little bit more trouble but again still seems to be doing better at this point which is good news. He is going require some sharp debridement in regard to the toe today however. 04/15/2019 on evaluation today patient actually appears to be doing quite well with regard to his toe ulcer. I am very pleased with how things are doing in that regard. With regard to his lower extremity edema in general he tells me he is not been using the lymphedema pumps regularly although he does not use them. I think that he needs to use them more regularly he does have a small blister on the left lower extremity this is not open at this point but obviously it something that can get worse if he does not keep the compression going and the pumps going as well. 04/22/2019 on evaluation today patient appears to be doing well with regard to his toe ulcer. He has been tolerating the dressing changes without complication. This is measuring slightly smaller this week as compared to last week. Fortunately there is no signs of active infection at this  time. 05/06/2019 on evaluation today patient actually appears to be doing excellent. In fact his toe appears to be completely healed which is great news. There is no signs of active infection at this time. Readmission: Patient presents today for follow-up after having been discharged at the beginning of December 2020. He states that in recent weeks things have reopened and has been having more trouble at this point. With that being said fortunately there is no signs of active infection at this time. No fever chills noted. Nonetheless he also does have an issue with one of his toes as well that seems to be somewhat this is on the right foot second toe. 07/25/2019 upon evaluation today patient's lower extremities appear to be doing excellent bilaterally. I really feel  like he is showing signs of great improvement and in fact I think he is close to healing which is great news. There is no signs of active infection at this time. No fevers, chills, nausea, vomiting, or diarrhea. 07/31/2019 upon evaluation today patient appears to be doing excellent and in fact I think may be completely healed in regard to his right lower extremity that were still to monitor this 1 more week before closing it out. The left lower extremity is measuring smaller although not completely closed seems to be doing excellent. 08/07/2019 upon evaluation today patient appears to be doing excellent in regard to his wounds. In fact the right lower extremity is completely healed there is no issues here. The left lower extremity has 1 very small area still remaining fortunately there is no signs of infection and overall I think he is very close to closure of the here as well. 08/14/19 upon evaluation today patient appears to be doing excellent in regard to his lower extremities. In fact he appears to be completely healed as of today. Fortunately there's no signs of active infection at this time. No fevers, chills, nausea, or vomiting noted  at this time. READMISSION 09/26/2019 This is a 75 year old man who is well-known to this clinic having just been discharged on 08/14/2019. He has known lymphedema and chronic venous insufficiency. He has Farrow wrap stockings and also external compression pumps. He does not use the external compression pumps however. He does however apparently fairly faithfully use the stockings. They have not been lotion in his legs. He has developed new wounds on the right anterior as well as the left medial left lateral and left posterior calf. He returns for our review of this Past medical history includes coronary artery disease, hypertension, congestive heart failure, COPD, venous insufficiency with lymphedema, type 2 diabetes. He apparently has compression pumps, Farrow wraps and at 1 point a hospital bed although I believe he sleeps in a recliner 10/03/2019 upon evaluation today patient appears to be doing better with regard to his wounds in general. He has been tolerating the dressing changes without complication. Fortunately there is no signs of active infection. No fevers, chills, nausea, vomiting, or diarrhea. I do believe the compression wraps are helping as they always have in the past. 10/10/2019 upon evaluation today patient appears to be doing excellent at this point. His right lower extremity is measuring much better and in fact is completely healed. His left lower extremity is also measuring better though not healed seems to be doing excellent at this point which is great news. Overall I am very pleased with how things appear currently. 10/27/2019 upon evaluation today patient appears to be doing quite well with regard to his wounds currently. He has been tolerating the dressing changes without complication. Fortunately there is no signs of active infection at this time. In fact he is almost completely healed on the left and has just a very small area open and on the right he is completely  healed. 11/04/2019 upon evaluation today patient appears to be doing quite well with regard to his wounds. In fact he appears to be quite possibly completely healed on the left lower extremity now as well the right lower extremity is still doing well. With that being said unfortunately though this may be healed I think it is a very fragile area and I cannot even confirm there is not a very small opening still remaining. I really think he may benefit from 1 additional weeks  wrapping before discontinuing. 11/10/2019 upon evaluation today patient appears to be doing well in regard to his original wounds in fact everything that we were treating last week is completely healed on the left. Unfortunately he has a new skin tear just above where the wrap slipped down due to a fall he sustained causing this injury. 11/17/2019 on evaluation today patient appears to be doing better with regard to his wound. He has been tolerating the dressing changes without Adam Merritt, Adam Merritt. ([id number]) complication. Fortunately there is no signs of active infection at this time. No fevers, chills, nausea, vomiting, or diarrhea. 11/24/2019 upon evaluation today patient appears to be doing well with regard to his wound. This is making good progress there does not appear to be signs of active infection but overall I do feel like he is headed in the correct direction. Overall he is tolerating the compression wrap as well. 12/02/2019 upon evaluation today patient appears to be doing well with regard to his leg ulcer. In fact this appears to be completely healed and this is the last of the wounds that he had but still the left anterior lower extremity. Fortunately there is no signs of active infection at this time. No fevers, chills, nausea, vomiting, or diarrhea. Readmission: 02/05/2020 on evaluation today patient appears to be doing well with regard to his wound all things considered. He actually tells me that a couple weeks ago he had  trouble with his wraps and therefore took him off for a couple of days in order to give his legs a break. It was during this time that he actually developed increased swelling and edema of his legs and blisters on both legs. That is when he called to make the appointment. Nonetheless since that time he began wearing his compression wraps which are Velcro in the meantime and has done extremely well with this. In fact his leg appears to be under good control as far as edema is concerned today it is nothing like what I am seeing when he has come in for evaluations in the past. They have been using some of the silver alginate at home which has been beneficial for him. Fortunately there is no signs of active infection at this time. No fevers, chills, nausea, vomiting, or diarrhea. 02/19/2020 on evaluation today patient unfortunately does not appear to be doing quite as well as I would like to see. He has no signs of active infection at this time but unfortunately he is not doing well in regard to the overall appearance of his left leg. He has a couple other areas that are weeping now and the leg is much more swollen. I think that he needs to actually have a compression wrap to try to manage this. 02/26/2020 on evaluation today patient appears to be doing well with regard to his left lower extremity ulcer. Fortunately the swelling is down I do believe the pressure wrap seems to be doing quite well. Fortunately there is no signs of active infection at this time. 03/05/2020 upon evaluation today patient appears to be doing excellent in regard to his legs at this point. Fortunately there is no signs of active infection which is great news. Overall he feels like he is completely healed which is great news. Readmission: 05/20/2020 upon evaluation today patient appears to be doing well with regard to his leg ulcer all things considered. He is actually coming back to see [place] after having had a reopening of the ulcers  on his left  leg in the past several weeks. He is out of dressing supplies and his wife is been try to take care of this as best she can. 06/10/2020 upon evaluation today patient unfortunately appears to be doing significantly worse today compared to when I last saw him. He and his wife both tell me that "there has been a lot going on". I did not actually go into detail about everything that has been happening but nonetheless he has not obviously been wearing his compression. He brings them with him today but they were not on. I am not even certain to be honest that he could get those on at this point. Nonetheless I do believe that he is going to require compression wrapping at some point shortly but right now we need to try to get what appears to be infection under control at this time. 06/28/2020 upon evaluation today patient appears to be doing well with regard to 06/28/2020 upon evaluation today patient appears to be doing well pretty well in regard to his left leg which is much better than previous. With that being said in regard to his right leg he does seem to be having some issues with a new wound after he sustained a fall. This fortunately is not too deep but does appear to be something we need to address. Neither deep which is good. 07/05/2020 on evaluation today patient appears to be doing well with regard to his wounds. In fact everything on the left leg appears to be pretty much healed on the right leg this is very close though not completely closed yet. Fortunately there is no evidence of active infection at this time. No fevers, chills, nausea, vomiting, or diarrhea. 07/13/2019 upon evaluation today patient appears to be making good progress which is great news. Overall I am extremely pleased with where things stand today. There does not appear to be any signs of active infection which is great news and overall his left leg appears healed still the right leg though not healed does seem to be  making good progress which is excellent news. He is having no pain. 07/19/2020 on evaluation today patient appears to be doing well with regard to his wound. He has been tolerating the dressing changes without complication. Fortunately there does not appear to be any signs of active infection at this time. No fevers, chills, nausea, vomiting, or diarrhea. Readmission: 08/20/2020 on evaluation today patient presents for reevaluation here in the clinic concerning issues that he has been having with his bilateral lower extremities. Unfortunately this is an ongoing reoccurring issue. He tells me that he is really not certain exactly what caused the issue although as I discussed this further with him it appears that he has been sitting for the most part and sleeping in his chair with his feet on the ground he is not really elevating at all in fact the chair does not even have a reclining function therefore it is basically just him sitting with his feet on the ground. I think this alone has probably led to his increased swelling he is also having more difficulty walking which means that he is pumping less blood flow through his legs anyway as far as the venous stasis is concerned. All in all I think this is leading to a downward spiral of him not being able to walk very well as his legs are so heavy, they begin to weep, and overall he just does not seem to be doing as well as it  was when I last saw him. 08/27/2020 upon evaluation today patient's legs actually are showing signs of improvement which is great news. There does not appear to be any evidence of infection which is also excellent news. I am extremely pleased with where things stand today. No fevers, chills, nausea, vomiting, or diarrhea. 09/03/20 upon evaluation today patient appears to be doing excellent in regard to his wounds currently. He has been tolerating the dressing changes without complication and overall I am extremely pleased with where  things stand today. No fevers, chills, nausea, vomiting, or diarrhea. Overall he is healed on the right leg although the left leg not being completely healed still is doing significantly better. 09/10/2020/8/22 upon evaluation today patient appears to be doing well with regard to his wounds. He has been tolerating the dressing changes without complication. Fortunately he appears to be completely healed. I am very pleased in that regard. As is the patient his right leg is also doing well he did get a new recliner which is helping him keep his feet off the ground which I think is helped he is down 2 cm on the left compared to last week so I think there is some definite improvement here. Adam Merritt, Adam Merritt ([id number]) Readmission 12/15/20: Mr. Cass Vandermeulen is a 75 year old male with a past medical history of lymphedema, insulin-dependent type 2 diabetes, COPD and congestive heart failure that presents to the clinic for increased drainage from his legs bilaterally, Left greater than right. Patient has lymphedema. He has been seen in our clinic on multiple occasions for this issue. He currently denies signs of infection. He has not been using his lymphedema pumps. 12/23/2020 upon evaluation today patient appears to be doing well with regard to his wounds. He has been tolerating the dressing changes he really does not have any open wounds just weeping areas of the legs bilaterally and fortunately seems to be doing better at this point. 12/28/2020 upon evaluation today patient's wound is actually showing signs of good improvement. There does not appear to be any evidence of infection which is great news and overall I am extremely pleased at this point. I think is very close to complete resolution. Electronic Signature(s) Signed: 12/28/2020 5:48:46 PM By: Worthy Keeler PA-C Entered By: Worthy Keeler on 12/28/2020 17:48:46 Adam Merritt  ([id number]) -------------------------------------------------------------------------------- Physical Exam Details Patient Name: Adam Merritt Date of Service: 12/28/2020 12:30 PM Medical Record Number: [id number] Patient Account Number: [id number] Date of Birth/Sex: 07-24-1945 (75 y.o. M) Treating RN: Carlene Coria Primary Care Provider: [name] Other Clinician: Referring Provider: [name] Treating Provider/Extender: Jeri Cos [name]: 1 Constitutional Well-nourished and well-hydrated in no acute distress. Respiratory normal breathing without difficulty. Psychiatric this patient is able to make decisions and demonstrates good insight into disease process. Alert and Oriented x 3. pleasant and cooperative. Notes Patient's wound bed actually showed signs of good granulation epithelization at this point. There does not appear to be any evidence of infection which is great news and overall I am extremely pleased with where things stand today. There is no evidence that there is anything worsening at this time which is also good news. Electronic Signature(s) Signed: 12/28/2020 5:49:03 PM By: Worthy Keeler PA-C Entered By: Worthy Keeler on 12/28/2020 17:49:03 Adam Merritt ([id number]) -------------------------------------------------------------------------------- Physician Orders Details Patient Name: Adam Merritt Date of Service: 12/28/2020 12:30 PM Medical Record Number: [id number] Patient Account Number: [id number] Date of Birth/Sex: 1945-08-20 (75 y.o. M) Treating RN: Epps,  Morey Hummingbird Primary Care Provider: [name] Other Clinician: Referring Provider: [name] Treating Provider/Extender: Skipper Cliche in Treatment: 1 Verbal / Phone Orders: No Diagnosis Coding ICD-10 Coding Code Description I89.0 Lymphedema, not elsewhere classified I87.2 Venous insufficiency (chronic) (peripheral) Follow-up Appointments o Return Appointment in 1 week. o  Nurse Visit as needed Bathing/ Shower/ Hygiene o May shower with wound dressing protected with water repellent cover or cast protector. o No tub bath. Edema Control - Lymphedema / Segmental Compressive Device / Other Bilateral Lower Extremities o 4 Layer Compression System Lymphedema. - silvercell on wet areas followed by xsorb o Elevate legs to the level of the heart and pump ankles as often as possible o Elevate leg(s) parallel to the floor when sitting. o Compression Pump: Use compression pump on left lower extremity for 60 minutes, twice daily. o Compression Pump: Use compression pump on right lower extremity for 60 minutes, twice daily. o DO YOUR BEST to sleep in the bed at night. DO NOT sleep in your recliner. Long hours of sitting in a recliner leads to swelling of the legs and/or potential wounds on your backside. Electronic Signature(s) Signed: 12/29/2020 5:04:06 PM By: Worthy Keeler PA-C Signed: 12/31/2020 4:32:50 PM By: Carlene Coria RN Entered By: Carlene Coria on 12/28/2020 13:18:30 Adam Merritt ([id number]) -------------------------------------------------------------------------------- Problem List Details Patient Name: Adam Merritt Date of Service: 12/28/2020 12:30 PM Medical Record Number: [id number] Patient Account Number: [id number] Date of Birth/Sex: 1946-05-06 (75 y.o. M) Treating RN: Carlene Coria Primary Care Provider: [name] Other Clinician: Referring Provider: [name] Treating Provider/Extender: Skipper Cliche in Treatment: 1 Active Problems ICD-10 Encounter Code Description Active Date MDM Diagnosis I89.0 Lymphedema, not elsewhere classified 12/15/2020 No Yes I87.2 Venous insufficiency (chronic) (peripheral) 12/28/2020 No Yes Inactive Problems Resolved Problems Electronic Signature(s) Signed: 12/28/2020 1:16:35 PM By: Worthy Keeler PA-C Entered By: Worthy Keeler on 12/28/2020 13:16:35 Adam Merritt  ([id number]) -------------------------------------------------------------------------------- Progress Note Details Patient Name: Adam Merritt Date of Service: 12/28/2020 12:30 PM Medical Record Number: [id number] Patient Account Number: [id number] Date of Birth/Sex: January 29, 1946 (75 y.o. M) Treating RN: Carlene Coria Primary Care Provider: [name] Other Clinician: Referring Provider: [name] Treating Provider/Extender: Skipper Cliche in Treatment: 1 Subjective Chief Complaint Information obtained from Patient Bilateral LE edema and weeping History of Present Illness (HPI) 10/18/17-He is here for initial evaluation of bilateral lower extremity ulcerations in the presence of venous insufficiency and lymphedema. He has been seen by vascular medicine in the past, Dr. Lucky Cowboy, last seen in 2016. He does have a history of abnormal ABIs, which is to be expected given his lymphedema and venous insufficiency. According to Epic, it appears that all attempts for arterial evaluation and/or angiography were not follow through with by patient. He does have a history of being seen in lymphedema clinic in 2018, stopped going approximately 6 months ago stating "it didn't do any good". He does not have lymphedema pumps, he does not have custom fit compression wrap/stockings. He is diabetic and his recent A1c last month was 7.6. He admits to chronic bilateral lower extremity pain, no change in pain since blister and ulceration development. He is currently being treated with Levaquin for bronchitis. He has home health and we will continue. 10/25/17-He is here in follow-up evaluation for bilateral lower extremity ulcerationssubtle he remains on Levaquin for bronchitis. Right lower extremity with no evidence of drainage or ulceration, persistent left lower extremity ulceration. He states that home health has not been out  since his appointment. He went to [facility] on Tuesday, studies revealed:  RIGHT ABI 0.9, TBI 0.6 LEFT ABI 1.1, TBI 0.6 with triphasic flow bilaterally. We will continue his same treatment plan. He has been educated on compression therapy and need for elevation. He will benefit from lymphedema pumps 11/01/17-He is here in follow-up evaluation for left lower extremity ulcer. The right lower extremity remains healed. He has home health services, but they have not been out to see the patient for 2-3 weeks. He states it home health physical therapy changed his dressing yesterday after therapy; he placed Adam Merritt wrap compression. We are still waiting for lymphedema pumps, reordered d/t need for company change. 11/08/17-He is here in follow-up evaluation for left lower extremity ulcer. It is improved. Edema is significantly improved with compression therapy. We will continue with same treatment plan and he will follow-up next week. No word regarding lymphedema pumps 11/15/17-He is here in follow-up evaluation for left lower extremity ulcer. He is healed and will be discharged from wound care services. I have reached out to [facility] regarding his lymphedema pumps. They have been unable to reach the patient; the contact number they had with the patient's wife's cell phone and she has not answered any unrecognized calls. Contact should be made today, trial planned for next week; [facility] will continue to follow 11/27/17 on evaluation today patient has multiple blistered areas over the right lower extremity his left lower extremity appears to be doing okay. These blistered areas show signs of no infection which is great news. With that being said he did have some necrotic skin overlying which was mechanically debrided away with saline and gauze today without complication. Overall post debridement the wounds appear to be doing better but in general his swelling seems to be increased. This is obviously not good news. I think this is what has given rise to the blisters. 12/04/17  on evaluation today patient presents for follow-up concerning his bilateral lower extremity edema in the right lower extremity ulcers. He has been tolerating the dressing changes without complication. With that being said he has had no real issues with the wraps which is also good news. Overall I'm pleased with the progress he's been making. 12/11/17 on evaluation today patient appears to be doing rather well in regard to his right lateral lower extremity ulcer. He's been tolerating the dressing changes without complication. Fortunately there does not appear to be any evidence of infection at this time. Overall I'm pleased with the progress that is being made. Unfortunately he has been in the hospital due to having what sounds to be a stomach virus/flu fortunately that is starting to get better. 12/18/17 on evaluation today patient actually appears to be doing very well in regard to his bilateral lower extremities the swelling is under fairly good control his lymphedema pumps are still not up and running quite yet. With that being said he does have several areas of opening noted as far as wounds are concerned mainly over the left lower extremity. With that being said I do believe once he gets lymphedema pumps this would least help you mention some the fluid and preventing this from occurring. Hopefully that will be set up soon sleeves are Artie in place at his home he just waiting for the machine. 12/25/17 on evaluation today patient actually appears to be doing excellent in fact all of his ulcers appear to have resolved his legs appear very well. I do think he needs  compression stockings we have discussed this and they are actually going to go to [facility] today to elastic therapy to get this fitted for him. I think that is definitely a good thing to do. Readmission: 04/09/18 upon evaluation today this patient is seen for readmission due to bilateral lower extremity lymphedema. He has significant swelling  of his extremities especially on the left although the right is also swollen he has weeping from both sides. There are no obvious open wounds at this point. Fortunately he has been doing fairly well for quite a bit of time since I last saw him. Nonetheless unfortunately this seems to have reopened and is giving quite a bit of trouble. He states this began about a week ago when he first called [place] to get in to be seen. No fevers, chills, nausea, or vomiting noted at this time. He has not been using his lymphedema pumps due to the fact that they won't fit on his leg at this point likewise is also not been using his compression for essentially the same reason. 04/16/18 upon evaluation today patient actually appears to be doing a little better in regard to the fluid in his bilateral lower extremities. With that being said he's had three falls since I saw him last week. He also states that he's been feeling very poorly. I was concerned last week and feel like that the concern is still there as far as the congestion in his chest is concerned he seems to be breathing about the same as last week but again he states he's very weak he's not even able to walk further than from the chair to the door. His wife had to buy a wheelchair just to be able to get them out of the house to get to the appointment today. This has me very concerned. DWYNE, HASEGAWA ([id number]) 04/23/18 on evaluation today patient actually appears to be doing much better than last week's evaluation. At that time actually had to transport him to the ER via EMS and he subsequently was admitted for acute pulmonary edema, acute renal failure, and acute congestive heart failure. Fortunately he is doing much better. Apparently they did dialyze him and were able to take off roughly 35 pounds of fluid. Nonetheless he is feeling much better both in regard to his breathing and he's able to get around much better at this time compared to previous. Overall  I'm very happy with how things are at this time. There does not appear to be any evidence of infection currently. No fevers, chills, nausea, or vomiting noted at this time. 04/30/2018 patient seen today for follow-up and management of bilateral lower extremity lymphedema. He did express being more sad today than usual due to the recent loss of his dog. He states that he has been compliant with using the lymphedema pumps. However he does admit a minute over the last 2-3 days he has not been using the pumps due to the recent loss of his dog. At this time there is no drainage or open wounds to his lower extremities. The left leg edema is measuring smaller today. Still has a significant amount of edema on bilateral lower extremities With dry flaky skin. He will be referred to the lymphedema clinic for further management. Will continue 3 layer compression wraps and follow-up in 1-2 weeks.Denies any pain, fever, chairs recently. No recent falls or injuries reported during this visit. 05/07/18 on evaluation today patient actually appears to be doing very well in regard to  his lower extremities in general all things considered. With that being said he is having some pain in the legs just due to the amount of swelling. He does have an area where he had a blister on the left lateral lower extremity this is open at this point other than that there's nothing else weeping at this time. 05/14/18 on evaluation today patient actually appears to be doing excellent all things considered in regard to his lower extremities. He still has a couple areas of weeping on each leg which has continued to be the issue for him. He does have an appointment with the lymphedema clinic although this isn't until February 2020. That was the earliest they had. In the meantime he has continued to tolerate the compression wraps without complication. 05/28/18 on evaluation today patient actually appears to be doing more poorly in regard to  his left lower extremity where he has a wound open at this point. He also had a fall where he subsequently injured his right great toe which has led to an open wound at the site unfortunately. He has been tolerating the dressing changes without complication in general as far as the wraps are concerned that he has not been putting any dressing on the left 1st toe ulcer site. 06/11/18 on evaluation today patient appears to be doing much worse in regard to his bilateral lower extremity ulcers. He has been tolerating the dressing changes without complication although his legs have not been wrapped more recently. Overall I am not very pleased with the way his legs appear. I do believe he needs to be back in compression wraps he still has not received his compression wraps from the [facility] hospital as of yet. 06/18/18 on evaluation today patient actually appears to be doing significantly better than last time I saw him. He has been tolerating the compression wraps without complication in the circumferential ulcers especially appear to be doing much better. His toe ulcer on the right in regard to the great toe is better although not as good as the legs in my pinion. No fevers chills noted 07/02/18 on evaluation today patient appears to be doing much better in regard to his lower extremity ulcers. Unfortunately since I last saw him he's had the distal portion of his right great toe if he dated it sounds as if this actually went downhill very quickly. I had only seen him a few days prior and the toe did not appear to be infected at that point subsequently became infected very rapidly and it was decided by the surgeon that the distal portion of the toe needed to be removed. The patient seems to be doing well in this regard he tells me. With that being said his lower extremities are doing better from the standpoint of the wounds although he is significantly swollen at this point. 07/09/18 on evaluation today patient  appears to be doing better in regard to the wounds on his lower extremities. In fact everything is almost completely healed he is just a small area on the left posterior lower extremity that is open at this point. 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 With that being said he needs some kind of compression it does sound like he may not be wearing his compression, that is the wraps, during the entire time between  when he's here visit to visit. Apparently his wife took the current one off because it began to "fall apart". 07/16/18 on evaluation today patient appears to be extremely swollen especially in regard to his left lower extremity unfortunately. He also has a new skin tear over the left lower extremity and there's a smaller area on the right lower extremity as well. Unfortunately this seems to be due in part to blistering and fluid buildup in his leg. He did get the reduction wraps that were ordered by the [facility] hospital for him to go to lymphedema clinic. With that being said his wounds on the legs have not healed to the point to where they would likely accept them as a patient lymphedema clinic currently. We need to try to get this to heal. With that being said he's been taking his wraps off which is not doing him any favors at this point. In fact this is probably quite counterproductive compared to what needs to occur. We will likely need to increase to a four layer compression wrap and continue to also utilize elevation and he has to keep the wraps on not take them off as he's been doing currently hasn't had a wrap on since Saturday. 07/23/18 on evaluation today patient appears to be doing much better in regard to his bilateral lower extremities. In fact his left lower extremity which was the largest is actually  15 cm smaller today compared to what it was last time he was here in our clinic. This is obviously good news after just one week. Nonetheless the differences he actually kept the wraps on during the entire week this time. That's not typical for him. I do believe he understands a little bit better now the severity of the situation and why it's important for him to keep these wraps on. 07/30/18 on evaluation today patient actually appears to be doing rather well in regard to his lower extremities. His legs are much smaller than they have been in the past and he actually has only one very small rudder superficial region remaining that is not closed on the left lateral/posterior lower extremity even this is almost completely close. I do believe likely next week he will be healed without any complications. I do think we need to continue the wraps however this seems to be beneficial for him. I also think it may be a good time for [place] to go ahead and see about getting the appointment with the lymphedema clinic which is supposed to be made for him in order to keep this moving along and hopefully get them into compression wraps that will in the end help him to remain healed. 08/06/18 on evaluation today patient actually appears to be doing very well in regard as bilateral lower extremities. In fact his wounds appear to be completely healed at this time. 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 He is in the process of getting appointment with the lymphedema clinic we have made this referral were just waiting to hear back on the schedule time. We need to follow up on that today as well. 08/13/18 on evaluation today patient actually appears to be doing very well in regard to his bilateral lower extremities there are no open wounds at this point. We are gonna go ahead and see about ordering the Velcro compression wraps for him had a discussion with them about  [place] doing it versus the [place] they feel like they can  definitely afford going ahead and get the wraps themselves and they would prefer to try to avoid having to go to the lymphedema clinic if it all possible which I completely understand. As long as he has good compression I'm okay either way. Adam Merritt, Adam Merritt ([id number]) 3/17//20 on evaluation today patient actually appears to be doing well in regard to his bilateral lower extremity ulcers. He has been tolerating the dressing changes without complication specifically the compression wraps. Overall is had no issues my fingers finding that I see at this point is that he is having trouble with constipation. He tells me he has not been able to go to the bathroom for about six days. He's taken to over-the- counter oral laxatives unfortunately this is not helping. He has not contacted his doctor. 08/27/18 on evaluation today patient appears to be doing fairly well in regard to his lower extremities at this point. There does not appear to be any new altars is swelling is very well controlled. We are still waiting for his Velcro compression wraps to arrive that should be sometime in the next week is Artie sent the check for these. 09/03/18 on evaluation today patient appears to be doing excellent in regard to his bilateral lower extremities which he shows no signs of wound openings in regard to this point. He does have his Velcro compression wraps which did arrive in the mail since I saw him last week. Overall he is doing excellent in my pinion. 09/13/18 on evaluation today patient appears to be doing very well currently in regard to the overall appearance of his bilateral lower extremities although he's a little bit more swollen than last time we saw him. At that point he been discharged without any open wounds. Nonetheless he has a small open wound on the posterior left lower extremity with some evidence of cellulitis noted as well. Fortunately I feel like he has  made good progress overall with regard to his lower extremities from were things used to be. 09/20/18 on evaluation today patient actually appears to be doing much better. The erythematous lower extremity is improving wound itself which is still open appears to be doing much better as far as but appearance as well as pain is concerned overall very pleased in this regard. There's no signs of active infection at this time. 09/27/18 on evaluation today patient's wounds on the lower extremity actually appear to be doing fairly well at this time which is good news. There is no evidence of active infection currently and again is just as left lower extremity were there any wounds at this point anyway. I believe they may be completely healed but again I'm not 100% sure based on evaluation today. I think one more week of observation would likely be a good idea. 10/04/18 on evaluation today patient actually appears to be doing excellent in regard to the left lower extremity which actually appears to be completely healed as of today. Unfortunately he's been having issues with his right lower extremity have a new wound that has opened. Fortunately there's no evidence of active infection at this time which is good news. 10/10/18 upon evaluation today patient actually appears to be doing a little bit worse with the new open area on his right posterior lower extremity. He's been tolerating the dressing changes without complication. Right now we been using his compression wraps although I think we may need to switch back to actually performing bilateral compression wraps are in the clinic. No fevers, chills,  nausea, or vomiting noted at this time. 10/17/18 on evaluation today patient actually appears to be doing quite well in regard to his bilateral lower extremity ulcers. He has been tolerating the reps without complication although he would prefer not to rewrap his legs as of today. Fortunately there's no signs of  active infection which is good news. No fevers, chills, nausea, or vomiting noted at this time. 10/24/18 on evaluation today patient appears to be doing very well in regard to his lower extremities. His right lower extremity is shown signs of healing and his left lower Trinity though not healed appears to be improving which is excellent news. Overall very pleased with how things seem to be progressing at this time. The patient is likewise happy to hear this. His Velcro compression wraps however have not been put on properly will gonna show his wife how to do that properly today. 10/31/18 on evaluation today patient appears to be doing more poorly in regard to his left lower extremity in particular although both lower extremities actually are showing some signs of being worse than my previous evaluation. Unfortunately I'm just not sure that his compression stockings even with the use of the compression/lymphedema pumps seem to be controlling this well. Upon further questioning he tells me that he also is not able to lie flat in the bed due to his congestive heart failure and difficulty breathing. For that reason he sleeps in his recliner. To make matters worse his recliner also cannot even hold his legs up so instead of even being somewhat elevated they pretty much hang down to the floor. This is the way he sleeps each night which is definitely counterproductive to everything else that were attempting to do from the standpoint of controlling his fluid. Nonetheless I think that he potentially could benefit from a hospital bed although this would be something that his primary care provider would likely have to order since anything that is order on our side has to be directly related to wound care and again the hospital bed is not necessarily a direct relation to although I think it does contribute to his overall wound status on his lower extremities. 11/07/18 on evaluation today patient appears to be doing  better in regard to his bilateral lower extremity. He's been tolerating the dressing changes without complication. We did in the interim since I last saw him switch to just using extras orbiting new alginate and that seems to have done much better for him. I'm very pleased with the overall progress that is made. 11/14/18 on evaluation today patient appears to be redoing rather well in regard to his left lower extremity ulcers which are the only ones remaining at this point. Fortunately there's no signs of active infection at this time which is good news. Overall been very pleased with how things seem to be progressing currently. No fevers, chills, nausea, or vomiting noted at this time. 11/20/17 on evaluation today patient actually appears to be doing quite well in regard to his lower extremities on the left on the right he has several blisters that showed up although there's some question about whether or not he has had his broker compression wraps on like he was supposed to or not. Fortunately there's no signs of active infection at this time which is good news. Unfortunately though he is doing well from the standpoint of the left leg the right leg is not doing as well again this is when we did not wrap last week.  11/28/18 on evaluation today patient appears to be doing well in regard to his bilateral lower extremities is left appears to be healed is right is not healed but is very close to being so. Overall very pleased with how things seem to be progressing. Patient is likewise happy that things are doing well. 12/09/18 on evaluation today patient actually appears to be completely healed which is excellent news. He actually seems to be doing well in regard to the swelling of the bilateral lower extremities which is also great news. Overall very pleased with how things seem to be progressing. Readmission: 03/18/2019 upon evaluation today patient presents for reevaluation concerning issues that he is  having with his left lower extremity where he does have a wound noted upon inspection today. He also has a wound on the right second toe on the tip where it is apparently been rubbing in his shoe I did look at issues and you can actually see where this has been occurring as well. Fortunately there is no signs of infection with regard to Adam Merritt, Adam Merritt. ([id number]) the toe necessarily although it is very swollen compared to normal that does have me somewhat concerned about the possibility of further evaluation for infection/osteomyelitis. I would recommend an x-ray to start with. Otherwise he states that it is been 1-2 weeks that he has had the draining in regard to left lower extremity he does not know how this happened he has been wearing his compression appropriately and I do see that as well today but nonetheless I do think that he needs to continue to be very cautious with regard to elevation as well. 03/25/2019 on evaluation today patient appears to be doing much better in regard to his lower extremity at this time. That is on the left. He did culture positive for Pseudomonas but the good news is he seems to be doing much better the gentamicin we applied topically seems to have done a great job for him. There is no signs of active infection at this time systemically and locally he is doing much better. No fevers, chills, nausea, vomiting, or diarrhea. In regard to his right toe this seems to be doing much better there is very little pressure noted at this point I did clean up some of the callus today around the edges of the wound as well as the surface of the wound but to be honest he is progressing quite nicely. 10/30; the area on the left anterior lower tibia area is healed over. His edema control is adequate even though his use of the compression pumps seems very intermittent. He has a juxta lite stocking on the right leg and a think there is 1 for the left leg at home. His wife is putting  these on. He has an area on the plantar right second toe which is a hammertoe they are trying to offload this 04/08/2019 on evaluation today patient appears to be doing well in regard to his lower extremities which is good news. We are seeing him today for his toe ulcer which is giving him a little bit more trouble but again still seems to be doing better at this point which is good news. He is going require some sharp debridement in regard to the toe today however. 04/15/2019 on evaluation today patient actually appears to be doing quite well with regard to his toe ulcer. I am very pleased with how things are doing in that regard. With regard to his lower extremity edema in  general he tells me he is not been using the lymphedema pumps regularly although he does not use them. I think that he needs to use them more regularly he does have a small blister on the left lower extremity this is not open at this point but obviously it something that can get worse if he does not keep the compression going and the pumps going as well. 04/22/2019 on evaluation today patient appears to be doing well with regard to his toe ulcer. He has been tolerating the dressing changes without complication. This is measuring slightly smaller this week as compared to last week. Fortunately there is no signs of active infection at this time. 05/06/2019 on evaluation today patient actually appears to be doing excellent. In fact his toe appears to be completely healed which is great news. There is no signs of active infection at this time. Readmission: Patient presents today for follow-up after having been discharged at the beginning of December 2020. He states that in recent weeks things have reopened and has been having more trouble at this point. With that being said fortunately there is no signs of active infection at this time. No fever chills noted. Nonetheless he also does have an issue with one of his toes as well that  seems to be somewhat this is on the right foot second toe. 07/25/2019 upon evaluation today patient's lower extremities appear to be doing excellent bilaterally. I really feel like he is showing signs of great improvement and in fact I think he is close to healing which is great news. There is no signs of active infection at this time. No fevers, chills, nausea, vomiting, or diarrhea. 07/31/2019 upon evaluation today patient appears to be doing excellent and in fact I think may be completely healed in regard to his right lower extremity that were still to monitor this 1 more week before closing it out. The left lower extremity is measuring smaller although not completely closed seems to be doing excellent. 08/07/2019 upon evaluation today patient appears to be doing excellent in regard to his wounds. In fact the right lower extremity is completely healed there is no issues here. The left lower extremity has 1 very small area still remaining fortunately there is no signs of infection and overall I think he is very close to closure of the here as well. 08/14/19 upon evaluation today patient appears to be doing excellent in regard to his lower extremities. In fact he appears to be completely healed as of today. Fortunately there's no signs of active infection at this time. No fevers, chills, nausea, or vomiting noted at this time. READMISSION 09/26/2019 This is a 75 year old man who is well-known to this clinic having just been discharged on 08/14/2019. He has known lymphedema and chronic venous insufficiency. He has Farrow wrap stockings and also external compression pumps. He does not use the external compression pumps however. He does however apparently fairly faithfully use the stockings. They have not been lotion in his legs. He has developed new wounds on the right anterior as well as the left medial left lateral and left posterior calf. He returns for our review of this Past medical history includes  coronary artery disease, hypertension, congestive heart failure, COPD, venous insufficiency with lymphedema, type 2 diabetes. He apparently has compression pumps, Farrow wraps and at 1 point a hospital bed although I believe he sleeps in a recliner 10/03/2019 upon evaluation today patient appears to be doing better with regard to his wounds in  general. He has been tolerating the dressing changes without complication. Fortunately there is no signs of active infection. No fevers, chills, nausea, vomiting, or diarrhea. I do believe the compression wraps are helping as they always have in the past. 10/10/2019 upon evaluation today patient appears to be doing excellent at this point. His right lower extremity is measuring much better and in fact is completely healed. His left lower extremity is also measuring better though not healed seems to be doing excellent at this point which is great news. Overall I am very pleased with how things appear currently. 10/27/2019 upon evaluation today patient appears to be doing quite well with regard to his wounds currently. He has been tolerating the dressing changes without complication. Fortunately there is no signs of active infection at this time. In fact he is almost completely healed on the left and has just a very small area open and on the right he is completely healed. 11/04/2019 upon evaluation today patient appears to be doing quite well with regard to his wounds. In fact he appears to be quite possibly completely healed on the left lower extremity now as well the right lower extremity is still doing well. With that being said unfortunately though this may be healed I think it is a very fragile area and I cannot even confirm there is not a very small opening still remaining. I really think he may benefit from 1 additional weeks wrapping before discontinuing. Adam Merritt, Adam Merritt ([id number]) 11/10/2019 upon evaluation today patient appears to be doing well in regard to  his original wounds in fact everything that we were treating last week is completely healed on the left. Unfortunately he has a new skin tear just above where the wrap slipped down due to a fall he sustained causing this injury. 11/17/2019 on evaluation today patient appears to be doing better with regard to his wound. He has been tolerating the dressing changes without complication. Fortunately there is no signs of active infection at this time. No fevers, chills, nausea, vomiting, or diarrhea. 11/24/2019 upon evaluation today patient appears to be doing well with regard to his wound. This is making good progress there does not appear to be signs of active infection but overall I do feel like he is headed in the correct direction. Overall he is tolerating the compression wrap as well. 12/02/2019 upon evaluation today patient appears to be doing well with regard to his leg ulcer. In fact this appears to be completely healed and this is the last of the wounds that he had but still the left anterior lower extremity. Fortunately there is no signs of active infection at this time. No fevers, chills, nausea, vomiting, or diarrhea. Readmission: 02/05/2020 on evaluation today patient appears to be doing well with regard to his wound all things considered. He actually tells me that a couple weeks ago he had trouble with his wraps and therefore took him off for a couple of days in order to give his legs a break. It was during this time that he actually developed increased swelling and edema of his legs and blisters on both legs. That is when he called to make the appointment. Nonetheless since that time he began wearing his compression wraps which are Velcro in the meantime and has done extremely well with this. In fact his leg appears to be under good control as far as edema is concerned today it is nothing like what I am seeing when he has come in for evaluations  in the past. They have been using some of the  silver alginate at home which has been beneficial for him. Fortunately there is no signs of active infection at this time. No fevers, chills, nausea, vomiting, or diarrhea. 02/19/2020 on evaluation today patient unfortunately does not appear to be doing quite as well as I would like to see. He has no signs of active infection at this time but unfortunately he is not doing well in regard to the overall appearance of his left leg. He has a couple other areas that are weeping now and the leg is much more swollen. I think that he needs to actually have a compression wrap to try to manage this. 02/26/2020 on evaluation today patient appears to be doing well with regard to his left lower extremity ulcer. Fortunately the swelling is down I do believe the pressure wrap seems to be doing quite well. Fortunately there is no signs of active infection at this time. 03/05/2020 upon evaluation today patient appears to be doing excellent in regard to his legs at this point. Fortunately there is no signs of active infection which is great news. Overall he feels like he is completely healed which is great news. Readmission: 05/20/2020 upon evaluation today patient appears to be doing well with regard to his leg ulcer all things considered. He is actually coming back to see [place] after having had a reopening of the ulcers on his left leg in the past several weeks. He is out of dressing supplies and his wife is been try to take care of this as best she can. 06/10/2020 upon evaluation today patient unfortunately appears to be doing significantly worse today compared to when I last saw him. He and his wife both tell me that "there has been a lot going on". I did not actually go into detail about everything that has been happening but nonetheless he has not obviously been wearing his compression. He brings them with him today but they were not on. I am not even certain to be honest that he could get those on at this point.  Nonetheless I do believe that he is going to require compression wrapping at some point shortly but right now we need to try to get what appears to be infection under control at this time. 06/28/2020 upon evaluation today patient appears to be doing well with regard to 06/28/2020 upon evaluation today patient appears to be doing well pretty well in regard to his left leg which is much better than previous. With that being said in regard to his right leg he does seem to be having some issues with a new wound after he sustained a fall. This fortunately is not too deep but does appear to be something we need to address. Neither deep which is good. 07/05/2020 on evaluation today patient appears to be doing well with regard to his wounds. In fact everything on the left leg appears to be pretty much healed on the right leg this is very close though not completely closed yet. Fortunately there is no evidence of active infection at this time. No fevers, chills, nausea, vomiting, or diarrhea. 07/13/2019 upon evaluation today patient appears to be making good progress which is great news. Overall I am extremely pleased with where things stand today. There does not appear to be any signs of active infection which is great news and overall his left leg appears healed still the right leg though not healed does seem to be making good  progress which is excellent news. He is having no pain. 07/19/2020 on evaluation today patient appears to be doing well with regard to his wound. He has been tolerating the dressing changes without complication. Fortunately there does not appear to be any signs of active infection at this time. No fevers, chills, nausea, vomiting, or diarrhea. Readmission: 08/20/2020 on evaluation today patient presents for reevaluation here in the clinic concerning issues that he has been having with his bilateral lower extremities. Unfortunately this is an ongoing reoccurring issue. He tells me that he is  really not certain exactly what caused the issue although as I discussed this further with him it appears that he has been sitting for the most part and sleeping in his chair with his feet on the ground he is not really elevating at all in fact the chair does not even have a reclining function therefore it is basically just him sitting with his feet on the ground. I think this alone has probably led to his increased swelling he is also having more difficulty walking which means that he is pumping less blood flow through his legs anyway as far as the venous stasis is concerned. All in all I think this is leading to a downward spiral of him not being able to walk very well as his legs are so heavy, they begin to weep, and overall he just does not seem to be doing as well as it was when I last saw him. 08/27/2020 upon evaluation today patient's legs actually are showing signs of improvement which is great news. There does not appear to be any evidence of infection which is also excellent news. I am extremely pleased with where things stand today. No fevers, chills, nausea, vomiting, or diarrhea. 09/03/20 upon evaluation today patient appears to be doing excellent in regard to his wounds currently. He has been tolerating the dressing changes without complication and overall I am extremely pleased with where things stand today. No fevers, chills, nausea, vomiting, or diarrhea. Overall he is healed on the right leg although the left leg not being completely healed still is doing significantly better. Adam Merritt, Adam Merritt ([id number]) 09/10/2020/8/22 upon evaluation today patient appears to be doing well with regard to his wounds. He has been tolerating the dressing changes without complication. Fortunately he appears to be completely healed. I am very pleased in that regard. As is the patient his right leg is also doing well he did get a new recliner which is helping him keep his feet off the ground which I think is  helped he is down 2 cm on the left compared to last week so I think there is some definite improvement here. Readmission 12/15/20: Mr. Zahir Eisenhour is a 75 year old male with a past medical history of lymphedema, insulin-dependent type 2 diabetes, COPD and congestive heart failure that presents to the clinic for increased drainage from his legs bilaterally, Left greater than right. Patient has lymphedema. He has been seen in our clinic on multiple occasions for this issue. He currently denies signs of infection. He has not been using his lymphedema pumps. 12/23/2020 upon evaluation today patient appears to be doing well with regard to his wounds. He has been tolerating the dressing changes he really does not have any open wounds just weeping areas of the legs bilaterally and fortunately seems to be doing better at this point. 12/28/2020 upon evaluation today patient's wound is actually showing signs of good improvement. There does not appear to be any evidence  of infection which is great news and overall I am extremely pleased at this point. I think is very close to complete resolution. Objective Constitutional Well-nourished and well-hydrated in no acute distress. Vitals Time Taken: 1:11 PM, Temperature: 97.9 F, Pulse: 74 bpm, Respiratory Rate: 18 breaths/min, Blood Pressure: 126/80 mmHg. Respiratory normal breathing without difficulty. Psychiatric this patient is able to make decisions and demonstrates good insight into disease process. Alert and Oriented x 3. pleasant and cooperative. General Notes: Patient's wound bed actually showed signs of good granulation epithelization at this point. There does not appear to be any evidence of infection which is great news and overall I am extremely pleased with where things stand today. There is no evidence that there is anything worsening at this time which is also good news. Assessment Active Problems ICD-10 Lymphedema, not elsewhere  classified Venous insufficiency (chronic) (peripheral) Procedures There was a Three Layer Compression Therapy Procedure by Carlene Coria, RN. Post procedure Diagnosis Wound #: Same as Pre-Procedure There was a Three Layer Compression Therapy Procedure by Carlene Coria, RN. Post procedure Diagnosis Wound #: Same as Pre-Procedure STONE, SPIRITO ([id number]) Plan Follow-up Appointments: Return Appointment in 1 week. Nurse Visit as needed Bathing/ Shower/ Hygiene: May shower with wound dressing protected with water repellent cover or cast protector. No tub bath. Edema Control - Lymphedema / Segmental Compressive Device / Other: 4 Layer Compression System Lymphedema. - silvercell on wet areas followed by xsorb Elevate legs to the level of the heart and pump ankles as often as possible Elevate leg(s) parallel to the floor when sitting. Compression Pump: Use compression pump on left lower extremity for 60 minutes, twice daily. Compression Pump: Use compression pump on right lower extremity for 60 minutes, twice daily. DO YOUR BEST to sleep in the bed at night. DO NOT sleep in your recliner. Long hours of sitting in a recliner leads to swelling of the legs and/or potential wounds on your backside. 1. Would recommend currently that we going continue with the wound care measures as before and the patient is in agreement with the plan. This includes the use of the compression wraps that we will get a put on him today. He overall seems to be tolerating the eating that quite well. This is a 4-layer compression wrap bilaterally. 2. I am also going to recommend that we use silver cell to any open areas that are remaining although it seems like for the most part this is doing quite well and there does not appear to be much in the way of anything open or draining at this point it is almost completely sealed up across the board with very little weeping. We will see patient back for reevaluation in 1 week  here in the clinic. If anything worsens or changes patient will contact our office for additional recommendations. Electronic Signature(s) Signed: 12/28/2020 5:49:56 PM By: Worthy Keeler PA-C Entered By: Worthy Keeler on 12/28/2020 17:49:56 Adam Merritt ([id number]) -------------------------------------------------------------------------------- SuperBill Details Patient Name: Adam Merritt Date of Service: 12/28/2020 Medical Record Number: [id number] Patient Account Number: [id number] Date of Birth/Sex: [birth year] (75 y.o. M) Treating RN: Carlene Coria Primary Care Provider: [name] Other Clinician: Referring Provider: [name] Treating Provider/Extender: Skipper Cliche in Treatment: 1 Diagnosis Coding ICD-10 Codes Code Description I89.0 Lymphedema, not elsewhere classified I87.2 Venous insufficiency (chronic) (peripheral) Facility Procedures CPT4: Description Modifier Quantity Code 93810175 [id number] BILATERAL: Application of multi-layer venous compression system; leg (below knee), including 1 ankle and foot. Physician Procedures  CPT4 Code: 7893810 Description: 17510 - WC PHYS LEVEL 3 - EST PT Modifier: Quantity: 1 CPT4 Code: Description: ICD-10 Diagnosis Description I89.0 Lymphedema, not elsewhere classified I87.2 Venous insufficiency (chronic) (peripheral) Modifier: Quantity: Electronic Signature(s) Signed: 12/28/2020 5:51:09 PM By: Worthy Keeler PA-C Entered By: Worthy Keeler on 12/28/2020 17:51:08

## 2021-01-01 NOTE — Progress Notes (Signed)
Adam Merritt ([id number]) Visit Report for 12/28/2020 Arrival Information Details Patient Name: Adam Merritt Date of Service: 12/28/2020 12:30 PM Medical Record Number: [id number] Patient Account Number: [id number] Date of Birth/Sex: 1946/04/23 (75 y.o. M) Treating RN: Carlene Coria Primary Care Dearis Danis: [name] Other Clinician: Referring Lila Lufkin: [name] Treating Catheline Hixon/Extender: Skipper Cliche in Treatment: 1 Visit Information History Since Last Visit All ordered tests and consults were completed: No Patient Arrived: Wheel Chair Added or deleted any medications: No Arrival Time: 13:10 Any new allergies or adverse reactions: No Accompanied By: wife Had a fall or experienced change in No Transfer Assistance: None activities of daily living that may affect Patient Identification Verified: Yes risk of falls: Secondary Verification Process Completed: Yes Signs or symptoms of abuse/neglect since last visito No Patient Has Alerts: Yes Hospitalized since last visit: No Patient Alerts: DIABETIC Implantable device outside of the clinic excluding No cellular tissue based products placed in the center since last visit: Has Dressing in Place as Prescribed: Yes Has Compression in Place as Prescribed: Yes Pain Present Now: No Electronic Signature(s) Signed: 12/31/2020 4:32:50 PM By: Carlene Coria RN Entered By: Carlene Coria on 12/28/2020 13:11:12 Adam Merritt ([id number]) -------------------------------------------------------------------------------- Clinic Level of Care Assessment Details Patient Name: Adam Merritt Date of Service: 12/28/2020 12:30 PM Medical Record Number: [id number] Patient Account Number: [id number] Date of Birth/Sex: [birth year] (75 y.o. M) Treating RN: Carlene Coria Primary Care Nikitia Asbill: [name] Other Clinician: Referring Jazmyne Beauchesne: [name] Treating Teigan Manner/Extender: Skipper Cliche in Treatment: 1 Clinic Level of Care Assessment  Items TOOL 1 Quantity Score []  - Use when EandM and Procedure is performed on INITIAL visit 0 ASSESSMENTS - Nursing Assessment / Reassessment []  - General Physical Exam (combine w/ comprehensive assessment (listed just below) when performed on new 0 pt. evals) []  - 0 Comprehensive Assessment (HX, ROS, Risk Assessments, Wounds Hx, etc.) ASSESSMENTS - Wound and Skin Assessment / Reassessment []  - Dermatologic / Skin Assessment (not related to wound area) 0 ASSESSMENTS - Ostomy and/or Continence Assessment and Care []  - Incontinence Assessment and Management 0 []  - 0 Ostomy Care Assessment and Management (repouching, etc.) PROCESS - Coordination of Care []  - Simple Patient / Family Education for ongoing care 0 []  - 0 Complex (extensive) Patient / Family Education for ongoing care []  - 0 Staff obtains Programmer, systems, Records, Test Results / Process Orders []  - 0 Staff telephones HHA, Nursing Homes / Clarify orders / etc []  - 0 Routine Transfer to another Facility (non-emergent condition) []  - 0 Routine Hospital Admission (non-emergent condition) []  - 0 New Admissions / Biomedical engineer / Ordering NPWT, Apligraf, etc. []  - 0 Emergency Hospital Admission (emergent condition) PROCESS - Special Needs []  - Pediatric / Minor Patient Management 0 []  - 0 Isolation Patient Management []  - 0 Hearing / Language / Visual special needs []  - 0 Assessment of Community assistance (transportation, D/C planning, etc.) []  - 0 Additional assistance / Altered mentation []  - 0 Support Surface(s) Assessment (bed, cushion, seat, etc.) INTERVENTIONS - Miscellaneous []  - External ear exam 0 []  - 0 Patient Transfer (multiple staff / Civil Service fast streamer / Similar devices) []  - 0 Simple Staple / Suture removal (25 or less) []  - 0 Complex Staple / Suture removal (26 or more) []  - 0 Hypo/Hyperglycemic Management (do not check if billed separately) []  - 0 Ankle / Brachial Index (ABI) - do not check if  billed separately Has the patient been seen at the hospital within the last three  years: Yes Total Score: 0 Level Of Care: ____ Adam Merritt ([id number]) Electronic Signature(s) Signed: 12/31/2020 4:32:50 PM By: Carlene Coria RN Entered By: Carlene Coria on 12/28/2020 13:18:59 Adam Merritt ([id number]) -------------------------------------------------------------------------------- Compression Therapy Details Patient Name: Adam Merritt Date of Service: 12/28/2020 12:30 PM Medical Record Number: [id number] Patient Account Number: [id number] Date of Birth/Sex: [birth year] (75 y.o. M) Treating RN: Carlene Coria Primary Care Wanette Robison: [name] Other Clinician: Referring Cecila Satcher: [name] Treating Nicola Quesnell/Extender: Skipper Cliche in Treatment: 1 Compression Therapy Performed for Wound Assessment: NonWound Condition Lymphedema - Right Leg Performed By: Clinician Carlene Coria, RN Compression Type: Three Layer Post Procedure Diagnosis Same as Pre-procedure Electronic Signature(s) Signed: 12/31/2020 4:32:50 PM By: Carlene Coria RN Entered By: Carlene Coria on 12/28/2020 13:37:19 Adam Merritt ([id number]) -------------------------------------------------------------------------------- Compression Therapy Details Patient Name: Adam Merritt Date of Service: 12/28/2020 12:30 PM Medical Record Number: [id number] Patient Account Number: [id number] Date of Birth/Sex: 1946/02/16 (75 y.o. M) Treating RN: Carlene Coria Primary Care Gleen Ripberger: [name] Other Clinician: Referring Ewing Fandino: [name] Treating Emigdio Wildeman/Extender: Skipper Cliche in Treatment: 1 Compression Therapy Performed for Wound Assessment: NonWound Condition Lymphedema - Left Leg Performed By: Clinician Carlene Coria, RN Compression Type: Three Layer Post Procedure Diagnosis Same as Pre-procedure Electronic Signature(s) Signed: 12/31/2020 4:32:50 PM By: Carlene Coria RN Entered By: Carlene Coria on 12/28/2020  13:37:36 Adam Merritt ([id number]) -------------------------------------------------------------------------------- Encounter Discharge Information Details Patient Name: Adam Merritt Date of Service: 12/28/2020 12:30 PM Medical Record Number: [id number] Patient Account Number: [id number] Date of Birth/Sex: 05-Oct-1945 (75 y.o. M) Treating RN: Carlene Coria Primary Care Aletha Allebach: [name] Other Clinician: Referring Daleah Coulson: [name] Treating Raynette Arras/Extender: Skipper Cliche in Treatment: 1 Encounter Discharge Information Items Discharge Condition: Stable Ambulatory Status: Wheelchair Discharge Destination: Home Transportation: Private Auto Accompanied By: self Schedule Follow-up Appointment: Yes Clinical Summary of Care: Patient Declined Electronic Signature(s) Signed: 12/31/2020 4:32:50 PM By: Carlene Coria RN Entered By: Carlene Coria on 12/28/2020 13:38:33 Adam Merritt ([id number]) -------------------------------------------------------------------------------- Lower Extremity Assessment Details Patient Name: Adam Merritt Date of Service: 12/28/2020 12:30 PM Medical Record Number: [id number] Patient Account Number: [id number] Date of Birth/Sex: [birth year] (75 y.o. M) Treating RN: Carlene Coria Primary Care Joelyn Lover: [name] Other Clinician: Referring Ilan Kahrs: [name] Treating Sylvania Moss/Extender: Jeri Cos [name]: 1 Edema Assessment Assessed: [Left: No] [Right: No] [Left: Edema] [Right: :] Calf Left: Right: Point of Measurement: 31 cm From Medial Instep 50 cm 45 cm Ankle Left: Right: Point of Measurement: 10 cm From Medial Instep 36 cm 28 cm Vascular Assessment Pulses: Dorsalis Pedis Palpable: [Left:Yes] [Right:Yes] Electronic Signature(s) Signed: 12/31/2020 4:32:50 PM By: Carlene Coria RN Entered By: Carlene Coria on 12/28/2020 13:12:25 Adam Merritt  ([id number]) -------------------------------------------------------------------------------- Multi Wound Chart Details Patient Name: Adam Merritt Date of Service: 12/28/2020 12:30 PM Medical Record Number: [id number] Patient Account Number: [id number] Date of Birth/Sex: 1946-04-13 (75 y.o. M) Treating RN: Carlene Coria Primary Care Ryenne Lynam: [name] Other Clinician: Referring Oretta Berkland: [name] Treating Manon Banbury/Extender: Skipper Cliche in Treatment: 1 Vital Signs Height(in): Pulse(bpm): 74 Weight(lbs): Blood Pressure(mmHg): 126/80 Body Mass Index(BMI): Temperature(F): 97.9 Respiratory Rate(breaths/min): 18 Wound Assessments Treatment Notes Electronic Signature(s) Signed: 12/31/2020 4:32:50 PM By: Carlene Coria RN Entered By: Carlene Coria on 12/28/2020 13:18:02 Adam Merritt ([id number]) -------------------------------------------------------------------------------- [place] Details Patient Name: Adam Merritt Date of Service: 12/28/2020 12:30 PM Medical Record Number: [id number] Patient Account Number: [id number] Date of Birth/Sex: 27-Nov-1945 (75 y.o. M) Treating RN: Carlene Coria Primary  Care Azaan Leask: [name] Other Clinician: Referring Meliza Kage: [name] Treating Delson Dulworth/Extender: Skipper Cliche in Treatment: 1 Active Inactive Wound/Skin Impairment Nursing Diagnoses: Impaired tissue integrity Goals: Patient/caregiver will verbalize understanding of skin care regimen Date Initiated: 12/15/2020 Target Resolution Date: 01/15/2021 Goal Status: Active Ulcer/skin breakdown will have a volume reduction of 30% by week 4 Date Initiated: 12/15/2020 Target Resolution Date: 01/15/2021 Goal Status: Active Ulcer/skin breakdown will have a volume reduction of 50% by week 8 Date Initiated: 12/15/2020 Target Resolution Date: 02/15/2021 Goal Status: Active Ulcer/skin breakdown will have a volume reduction of 80% by week 12 Date Initiated:  12/15/2020 Target Resolution Date: 03/17/2021 Goal Status: Active Ulcer/skin breakdown will heal within 14 weeks Date Initiated: 12/15/2020 Target Resolution Date: 04/17/2021 Goal Status: Active Interventions: Assess patient/caregiver ability to obtain necessary supplies Assess patient/caregiver ability to perform ulcer/skin care regimen upon admission and as needed Assess ulceration(s) every visit Treatment Activities: Skin care regimen initiated : 12/15/2020 Notes: Electronic Signature(s) Signed: 12/31/2020 4:32:50 PM By: Carlene Coria RN Entered By: Carlene Coria on 12/28/2020 13:17:52 Adam Merritt ([id number]) -------------------------------------------------------------------------------- Pain Assessment Details Patient Name: Adam Merritt Date of Service: 12/28/2020 12:30 PM Medical Record Number: [id number] Patient Account Number: [id number] Date of Birth/Sex: 21-Jan-1946 (75 y.o. M) Treating RN: Carlene Coria Primary Care Cagney Steenson: [name] Other Clinician: Referring Belal Scallon: [name] Treating Jaceon Heiberger/Extender: Skipper Cliche in Treatment: 1 Active Problems Location of Pain Severity and Description of Pain Patient Has Paino No Site Locations Pain Management and Medication Current Pain Management: Electronic Signature(s) Signed: 12/31/2020 4:32:50 PM By: Carlene Coria RN Entered By: Carlene Coria on 12/28/2020 13:11:44 Adam Merritt ([id number]) -------------------------------------------------------------------------------- Patient/Caregiver Education Details Patient Name: Adam Merritt Date of Service: 12/28/2020 12:30 PM Medical Record Number: [id number] Patient Account Number: [id number] Date of Birth/Gender: Feb 11, 1946 (75 y.o. M) Treating RN: Carlene Coria Primary Care Physician: [name] Other Clinician: Referring Physician: [name] Treating Physician/Extender: Skipper Cliche in Treatment: 1 Education Assessment Education Provided  To: Patient Education Topics Provided Wound/Skin Impairment: Methods: Explain/Verbal Responses: State content correctly Electronic Signature(s) Signed: 12/31/2020 4:32:50 PM By: Carlene Coria RN Entered By: Carlene Coria on 12/28/2020 13:19:24 Adam Merritt ([id number]) -------------------------------------------------------------------------------- [place] Details Patient Name: Adam Merritt Date of Service: 12/28/2020 12:30 PM Medical Record Number: [id number] Patient Account Number: [id number] Date of Birth/Sex: 1945-11-22 (75 y.o. M) Treating RN: Carlene Coria Primary Care Ravyn Nikkel: [name] Other Clinician: Referring Lillyanne Bradburn: [name] Treating Liora Myles/Extender: Jeri Cos [name]: 1 Vital Signs Time Taken: 13:11 Temperature (F): 97.9 Pulse (bpm): 74 Respiratory Rate (breaths/min): 18 Blood Pressure (mmHg): 126/80 Reference Range: 80 - 120 mg / dl Electronic Signature(s) Signed: 12/31/2020 4:32:50 PM By: Carlene Coria RN Entered By: Carlene Coria on 12/28/2020 13:11:36

## 2021-01-04 ENCOUNTER — Encounter: Payer: Medicare PPO | Attending: Physician Assistant | Admitting: Physician Assistant

## 2021-01-04 ENCOUNTER — Other Ambulatory Visit: Payer: Self-pay

## 2021-01-04 DIAGNOSIS — I110 Hypertensive heart disease with heart failure: Secondary | ICD-10-CM | POA: Insufficient documentation

## 2021-01-04 DIAGNOSIS — I2510 Atherosclerotic heart disease of native coronary artery without angina pectoris: Secondary | ICD-10-CM | POA: Insufficient documentation

## 2021-01-04 DIAGNOSIS — E119 Type 2 diabetes mellitus without complications: Secondary | ICD-10-CM | POA: Insufficient documentation

## 2021-01-04 DIAGNOSIS — I509 Heart failure, unspecified: Secondary | ICD-10-CM | POA: Diagnosis not present

## 2021-01-04 DIAGNOSIS — I872 Venous insufficiency (chronic) (peripheral): Secondary | ICD-10-CM | POA: Insufficient documentation

## 2021-01-04 DIAGNOSIS — Z794 Long term (current) use of insulin: Secondary | ICD-10-CM | POA: Diagnosis not present

## 2021-01-04 DIAGNOSIS — L97819 Non-pressure chronic ulcer of other part of right lower leg with unspecified severity: Secondary | ICD-10-CM | POA: Diagnosis not present

## 2021-01-04 DIAGNOSIS — I890 Lymphedema, not elsewhere classified: Secondary | ICD-10-CM | POA: Insufficient documentation

## 2021-01-04 DIAGNOSIS — Z09 Encounter for follow-up examination after completed treatment for conditions other than malignant neoplasm: Secondary | ICD-10-CM | POA: Diagnosis not present

## 2021-01-04 DIAGNOSIS — L97829 Non-pressure chronic ulcer of other part of left lower leg with unspecified severity: Secondary | ICD-10-CM | POA: Diagnosis not present

## 2021-01-04 NOTE — Progress Notes (Signed)
Adam Merritt, Adam Merritt ([id number]) Visit Report for 01/04/2021 Arrival Information Details Patient Name: Adam Merritt Date of Service: 01/04/2021 8:00 AM Medical Record Number: [id number] Patient Account Number: [id number] Date of Birth/Sex: 1945/12/13 (75 y.o. M) Treating RN: Donnamarie Poag Primary Care Tillie Viverette: [name] Other Clinician: Referring Roopa Graver: [name] Treating Hulan Szumski/Extender: Skipper Cliche in Treatment: 2 Visit Information History Since Last Visit Added or deleted any medications: No Patient Arrived: Wheel Chair Had a fall or experienced change in No Arrival Time: 08:09 activities of daily living that may affect Accompanied By: wife risk of falls: Transfer Assistance: EasyPivot Patient Lift Hospitalized since last visit: No Patient Identification Verified: Yes Has Dressing in Place as Prescribed: Yes Secondary Verification Process Completed: Yes Has Compression in Place as Prescribed: Yes Patient Has Alerts: Yes Pain Present Now: Yes Patient Alerts: DIABETIC Electronic Signature(s) Signed: 01/04/2021 1:44:11 PM By: Donnamarie Poag Entered By: Donnamarie Poag on 01/04/2021 08:19:23 Adam Merritt ([id number]) -------------------------------------------------------------------------------- Clinic Level of Care Assessment Details Patient Name: Adam Merritt Date of Service: 01/04/2021 8:00 AM Medical Record Number: [id number] Patient Account Number: [id number] Date of Birth/Sex: 10-16-1945 (75 y.o. M) Treating RN: Donnamarie Poag Primary Care Charlynn Salih: [name] Other Clinician: Referring Johannah Rozas: [name] Treating Latanja Lehenbauer/Extender: Skipper Cliche in Treatment: 2 Clinic Level of Care Assessment Items TOOL 4 Quantity Score []  - Use when only an EandM is performed on FOLLOW-UP visit 0 ASSESSMENTS - Nursing Assessment / Reassessment []  - Reassessment of Co-morbidities (includes updates in patient status) 0 []  - 0 Reassessment of Adherence to Treatment  Plan ASSESSMENTS - Wound and Skin Assessment / Reassessment []  - Simple Wound Assessment / Reassessment - one wound 0 []  - 0 Complex Wound Assessment / Reassessment - multiple wounds X- 1 10 Dermatologic / Skin Assessment (not related to wound area) ASSESSMENTS - Focused Assessment X - Circumferential Edema Measurements - multi extremities 1 5 []  - 0 Nutritional Assessment / Counseling / Intervention []  - 0 Lower Extremity Assessment (monofilament, tuning fork, pulses) []  - 0 Peripheral Arterial Disease Assessment (using hand held doppler) ASSESSMENTS - Ostomy and/or Continence Assessment and Care []  - Incontinence Assessment and Management 0 []  - 0 Ostomy Care Assessment and Management (repouching, etc.) PROCESS - Coordination of Care X - Simple Patient / Family Education for ongoing care 1 15 []  - 0 Complex (extensive) Patient / Family Education for ongoing care []  - 0 Staff obtains Programmer, systems, Records, Test Results / Process Orders []  - 0 Staff telephones HHA, Nursing Homes / Clarify orders / etc []  - 0 Routine Transfer to another Facility (non-emergent condition) []  - 0 Routine Hospital Admission (non-emergent condition) []  - 0 New Admissions / Biomedical engineer / Ordering NPWT, Apligraf, etc. []  - 0 Emergency Hospital Admission (emergent condition) X- 1 10 Simple Discharge Coordination []  - 0 Complex (extensive) Discharge Coordination PROCESS - Special Needs []  - Pediatric / Minor Patient Management 0 []  - 0 Isolation Patient Management []  - 0 Hearing / Language / Visual special needs []  - 0 Assessment of Community assistance (transportation, D/C planning, etc.) []  - 0 Additional assistance / Altered mentation []  - 0 Support Surface(s) Assessment (bed, cushion, seat, etc.) INTERVENTIONS - Wound Cleansing / Measurement Adam Merritt ([id number]) []  - 0 Simple Wound Cleansing - one wound []  - 0 Complex Wound Cleansing - multiple wounds X- 1 5 Wound  Imaging (photographs - any number of wounds) []  - 0 Wound Tracing (instead of photographs) []  - 0 Simple Wound Measurement - one  wound []  - 0 Complex Wound Measurement - multiple wounds INTERVENTIONS - Wound Dressings []  - Small Wound Dressing one or multiple wounds 0 []  - 0 Medium Wound Dressing one or multiple wounds []  - 0 Large Wound Dressing one or multiple wounds []  - 0 Application of Medications - topical []  - 0 Application of Medications - injection INTERVENTIONS - Miscellaneous []  - External ear exam 0 []  - 0 Specimen Collection (cultures, biopsies, blood, body fluids, etc.) []  - 0 Specimen(s) / Culture(s) sent or taken to Lab for analysis []  - 0 Patient Transfer (multiple staff / Civil Service fast streamer / Similar devices) []  - 0 Simple Staple / Suture removal (25 or less) []  - 0 Complex Staple / Suture removal (26 or more) []  - 0 Hypo / Hyperglycemic Management (close monitor of Blood Glucose) []  - 0 Ankle / Brachial Index (ABI) - do not check if billed separately X- 1 5 Vital Signs Has the patient been seen at the hospital within the last three years: Yes Total Score: 50 Level Of Care: New/Established - Level 2 Electronic Signature(s) Signed: 01/04/2021 1:44:11 PM By: Donnamarie Poag Entered By: Donnamarie Poag on 01/04/2021 08:26:19 Adam Merritt ([id number]) -------------------------------------------------------------------------------- Encounter Discharge Information Details Patient Name: Adam Merritt Date of Service: 01/04/2021 8:00 AM Medical Record Number: [id number] Patient Account Number: [id number] Date of Birth/Sex: 17-Apr-1946 (75 y.o. M) Treating RN: Donnamarie Poag Primary Care Denham Mose: [name] Other Clinician: Referring Kama Cammarano: [name] Treating Diangelo Radel/Extender: Skipper Cliche in Treatment: 2 Encounter Discharge Information Items Discharge Condition: Stable Ambulatory Status: Wheelchair Discharge Destination: Home Transportation: Private  Auto Accompanied By: self Schedule Follow-up Appointment: Yes Clinical Summary of Care: Electronic Signature(s) Signed: 01/04/2021 1:44:11 PM By: Donnamarie Poag Entered By: Donnamarie Poag on 01/04/2021 08:42:37 Adam Merritt ([id number]) -------------------------------------------------------------------------------- Lower Extremity Assessment Details Patient Name: Adam Merritt Date of Service: 01/04/2021 8:00 AM Medical Record Number: [id number] Patient Account Number: [id number] Date of Birth/Sex: 1946-05-23 (75 y.o. M) Treating RN: Donnamarie Poag Primary Care Aadit Hagood: [name] Other Clinician: Referring Jamilyn Pigeon: [name] Treating Novelle Addair/Extender: Jeri Cos [name]: 2 Edema Assessment Assessed: [Left: Yes] [Right: Yes] Edema: [Left: Yes] [Right: Yes] Calf Left: Right: Point of Measurement: 31 cm From Medial Instep 50.5 cm 47 cm Ankle Left: Right: Point of Measurement: 10 cm From Medial Instep 34.5 cm 28.5 cm Vascular Assessment Pulses: Dorsalis Pedis Palpable: [Left:No Yes] [Right:No Yes] Electronic Signature(s) Signed: 01/04/2021 1:44:11 PM By: Donnamarie Poag Entered ByDonnamarie Poag on 01/04/2021 08:21:53 Adam Merritt ([id number]) -------------------------------------------------------------------------------- Multi Wound Chart Details Patient Name: Adam Merritt Date of Service: 01/04/2021 8:00 AM Medical Record Number: [id number] Patient Account Number: [id number] Date of Birth/Sex: 1946-03-30 (75 y.o. M) Treating RN: Donnamarie Poag Primary Care Anniece Bleiler: [name] Other Clinician: Referring Mitcheal Sweetin: [name] Treating Texanna Hilburn/Extender: Skipper Cliche in Treatment: 2 Vital Signs Height(in): Pulse(bpm): 58 Weight(lbs): Blood Pressure(mmHg): 123/56 Body Mass Index(BMI): Temperature(F): 98.2 Respiratory Rate(breaths/min): 20 Wound Assessments Treatment Notes Electronic Signature(s) Signed: 01/04/2021 1:44:11 PM By: Donnamarie Poag Entered By:  Donnamarie Poag on 01/04/2021 08:22:35 Adam Merritt ([id number]) -------------------------------------------------------------------------------- [place] Details Patient Name: Adam Merritt Date of Service: 01/04/2021 8:00 AM Medical Record Number: [id number] Patient Account Number: [id number] Date of Birth/Sex: [birth year] (75 y.o. M) Treating RN: Donnamarie Poag Primary Care Furious Chiarelli: [name] Other Clinician: Referring Davonta Stroot: [name] Treating Coumba Kellison/Extender: Jeri Cos [name]: 2 Active Inactive Electronic Signature(s) Signed: 01/04/2021 1:44:11 PM By: Donnamarie Poag Entered ByDonnamarie Poag on 01/04/2021 08:22:15  Adam Merritt, Adam Merritt ([id number]) -------------------------------------------------------------------------------- Non-Wound Condition Assessment Details Patient Name: Adam Merritt, Adam Merritt Date of Service: 01/04/2021 8:00 AM Medical Record Number: [id number] Patient Account Number: [id number] Date of Birth/Gender: 1945/08/24 (75 y.o. M) Treating RN: Donnamarie Poag Primary Care Physician: [name] Other Clinician: Referring Physician: [name] Treating Physician/Extender: Jeri Cos [name]: 2 Non-Wound Condition: Condition: Lymphedema Location: Leg Side: Right Photos Electronic Signature(s) Signed: 01/04/2021 1:44:11 PM By: Donnamarie Poag Entered By: Donnamarie Poag on 01/04/2021 08:19:13 Adam Merritt ([id number]) -------------------------------------------------------------------------------- Non-Wound Condition Assessment Details Patient Name: Adam Merritt Date of Service: 01/04/2021 8:00 AM Medical Record Number: [id number] Patient Account Number: [id number] Date of Birth/Gender: [birth year] (75 y.o. M) Treating RN: Donnamarie Poag Primary Care Physician: [name] Other Clinician: Referring Physician: [name] Treating Physician/Extender: Jeri Cos [name]: 2 Non-Wound Condition: Condition: Lymphedema Location:  Leg Side: Left Photos Electronic Signature(s) Signed: 01/04/2021 1:44:11 PM By: Donnamarie Poag Entered By: Donnamarie Poag on 01/04/2021 08:19:15 Adam Merritt ([id number]) -------------------------------------------------------------------------------- Pain Assessment Details Patient Name: Adam Merritt Date of Service: 01/04/2021 8:00 AM Medical Record Number: [id number] Patient Account Number: [id number] Date of Birth/Sex: [birth year] (75 y.o. M) Treating RN: Donnamarie Poag Primary Care Nalah Macioce: [name] Other Clinician: Referring Cindy Brindisi: [name] Treating Laurita Peron/Extender: Skipper Cliche in Treatment: 2 Active Problems Location of Pain Severity and Description of Pain Patient Has Paino Yes Site Locations Pain Location: Generalized Pain, Pain in Ulcers Rate the pain. Current Pain Level: 3 Pain Management and Medication Current Pain Management: Electronic Signature(s) Signed: 01/04/2021 1:44:11 PM By: Donnamarie Poag Entered By: Donnamarie Poag on 01/04/2021 08:12:43 Adam Merritt ([id number]) -------------------------------------------------------------------------------- Patient/Caregiver Education Details Patient Name: Adam Merritt Date of Service: 01/04/2021 8:00 AM Medical Record Number: [id number] Patient Account Number: [id number] Date of Birth/Gender: 08/07/1945 (75 y.o. M) Treating RN: Donnamarie Poag Primary Care Physician: [name] Other Clinician: Referring Physician: [name] Treating Physician/Extender: Skipper Cliche in Treatment: 2 Education Assessment Education Provided To: Patient and Caregiver Education Topics Provided Basic Hygiene: Nutrition: Venous: Electronic Signature(s) Signed: 01/04/2021 1:44:11 PM By: Donnamarie Poag Entered By: Donnamarie Poag on 01/04/2021 08:25:02 Adam Merritt ([id number]) -------------------------------------------------------------------------------- [place] Details Patient Name: Adam Merritt Date of Service: 01/04/2021  8:00 AM Medical Record Number: [id number] Patient Account Number: [id number] Date of Birth/Sex: 10-07-1945 (75 y.o. M) Treating RN: Donnamarie Poag Primary Care Oprah Camarena: [name] Other Clinician: Referring Leander Tout: [name] Treating Sharonda Llamas/Extender: Jeri Cos [name]: 2 Vital Signs Time Taken: 08:11 Temperature (F): 98.2 Pulse (bpm): 62 Respiratory Rate (breaths/min): 20 Blood Pressure (mmHg): 123/56 Reference Range: 80 - 120 mg / dl Electronic Signature(s) Signed: 01/04/2021 1:44:11 PM By: Donnamarie Poag Entered ByDonnamarie Poag on 01/04/2021 08:12:33

## 2021-01-04 NOTE — Progress Notes (Addendum)
DAREN, YEAGLE ([id number]) Visit Report for 01/04/2021 Chief Complaint Document Details Patient Name: Adam Merritt, Adam Merritt Date of Service: 01/04/2021 8:00 AM Medical Record Number: [id number] Patient Account Number: [id number] Date of Birth/Sex: 12/12/1945 (75 y.o. M) Treating RN: Donnamarie Poag Primary Care Provider: [name] Other Clinician: Referring Provider: [name] Treating Provider/Extender: Skipper Cliche in Treatment: 2 Information Obtained from: Patient Chief Complaint Bilateral LE edema and weeping Electronic Signature(s) Signed: 01/04/2021 8:19:59 AM By: Worthy Keeler PA-C Entered By: Worthy Keeler on 01/04/2021 08:19:59 Adam Merritt ([id number]) -------------------------------------------------------------------------------- HPI Details Patient Name: Adam Merritt Date of Service: 01/04/2021 8:00 AM Medical Record Number: [id number] Patient Account Number: [id number] Date of Birth/Sex: 1945-06-22 (75 y.o. M) Treating RN: Donnamarie Poag Primary Care Provider: [name] Other Clinician: Referring Provider: [name] Treating Provider/Extender: Skipper Cliche in Treatment: 2 History of Present Illness HPI Description: 10/18/17-He is here for initial evaluation of bilateral lower extremity ulcerations in the presence of venous insufficiency and lymphedema. He has been seen by vascular medicine in the past, Dr. Lucky Cowboy, last seen in 2016. He does have a history of abnormal ABIs, which is to be expected given his lymphedema and venous insufficiency. According to Epic, it appears that all attempts for arterial evaluation and/or angiography were not follow through with by patient. He does have a history of being seen in lymphedema clinic in 2018, stopped going approximately 6 months ago stating "it didn't do any good". He does not have lymphedema pumps, he does not have custom fit compression wrap/stockings. He is diabetic and his recent A1c last month was 7.6. He admits to  chronic bilateral lower extremity pain, no change in pain since blister and ulceration development. He is currently being treated with Levaquin for bronchitis. He has home health and we will continue. 10/25/17-He is here in follow-up evaluation for bilateral lower extremity ulcerationssubtle he remains on Levaquin for bronchitis. Right lower extremity with no evidence of drainage or ulceration, persistent left lower extremity ulceration. He states that home health has not been out since his appointment. He went to [facility] on Tuesday, studies revealed: RIGHT ABI 0.9, TBI 0.6 LEFT ABI 1.1, TBI 0.6 with triphasic flow bilaterally. We will continue his same treatment plan. He has been educated on compression therapy and need for elevation. He will benefit from lymphedema pumps 11/01/17-He is here in follow-up evaluation for left lower extremity ulcer. The right lower extremity remains healed. He has home health services, but they have not been out to see the patient for 2-3 weeks. He states it home health physical therapy changed his dressing yesterday after therapy; he placed Ace wrap compression. We are still waiting for lymphedema pumps, reordered d/t need for company change. 11/08/17-He is here in follow-up evaluation for left lower extremity ulcer. It is improved. Edema is significantly improved with compression therapy. We will continue with same treatment plan and he will follow-up next week. No word regarding lymphedema pumps 11/15/17-He is here in follow-up evaluation for left lower extremity ulcer. He is healed and will be discharged from wound care services. I have reached out to [facility] regarding his lymphedema pumps. They have been unable to reach the patient; the contact number they had with the patient's wife's cell phone and she has not answered any unrecognized calls. Contact should be made today, trial planned for next week; [facility] will continue to  follow 11/27/17 on evaluation today patient has multiple blistered areas over the right lower  extremity his left lower extremity appears to be doing okay. These blistered areas show signs of no infection which is great news. With that being said he did have some necrotic skin overlying which was mechanically debrided away with saline and gauze today without complication. Overall post debridement the wounds appear to be doing better but in general his swelling seems to be increased. This is obviously not good news. I think this is what has given rise to the blisters. 12/04/17 on evaluation today patient presents for follow-up concerning his bilateral lower extremity edema in the right lower extremity ulcers. He has been tolerating the dressing changes without complication. With that being said he has had no real issues with the wraps which is also good news. Overall I'm pleased with the progress he's been making. 12/11/17 on evaluation today patient appears to be doing rather well in regard to his right lateral lower extremity ulcer. He's been tolerating the dressing changes without complication. Fortunately there does not appear to be any evidence of infection at this time. Overall I'm pleased with the progress that is being made. Unfortunately he has been in the hospital due to having what sounds to be a stomach virus/flu fortunately that is starting to get better. 12/18/17 on evaluation today patient actually appears to be doing very well in regard to his bilateral lower extremities the swelling is under fairly good control his lymphedema pumps are still not up and running quite yet. With that being said he does have several areas of opening noted as far as wounds are concerned mainly over the left lower extremity. With that being said I do believe once he gets lymphedema pumps this would least help you mention some the fluid and preventing this from occurring. Hopefully that will be set up soon sleeves  are Artie in place at his home he just waiting for the machine. 12/25/17 on evaluation today patient actually appears to be doing excellent in fact all of his ulcers appear to have resolved his legs appear very well. I do think he needs compression stockings we have discussed this and they are actually going to go to [facility] today to elastic therapy to get this fitted for him. I think that is definitely a good thing to do. Readmission: 04/09/18 upon evaluation today this patient is seen for readmission due to bilateral lower extremity lymphedema. He has significant swelling of his extremities especially on the left although the right is also swollen he has weeping from both sides. There are no obvious open wounds at this point. Fortunately he has been doing fairly well for quite a bit of time since I last saw him. Nonetheless unfortunately this seems to have reopened and is giving quite a bit of trouble. He states this began about a week ago when he first called [place] to get in to be seen. No fevers, chills, nausea, or vomiting noted at this time. He has not been using his lymphedema pumps due to the fact that they won't fit on his leg at this point likewise is also not been using his compression for essentially the same reason. 04/16/18 upon evaluation today patient actually appears to be doing a little better in regard to the fluid in his bilateral lower extremities. With that being said he's had three falls since I saw him last week. He also states that he's been feeling very poorly. I was concerned last week and feel like that the concern is still there as far as the congestion in  his chest is concerned he seems to be breathing about the same as last week but again he states he's very weak he's not even able to walk further than from the chair to the door. His wife had to buy a wheelchair just to be able to get them out of the house to get to the appointment today. This has me very  concerned. 04/23/18 on evaluation today patient actually appears to be doing much better than last week's evaluation. At that time actually had to transport him to the ER via EMS and he subsequently was admitted for acute pulmonary edema, acute renal failure, and acute congestive heart failure. Fortunately he is doing much better. Apparently they did dialyze him and were able to take off roughly 35 pounds of fluid. Nonetheless he is feeling much better both in regard to his breathing and he's able to get around much better at this time compared to previous. Overall I'm very ELDEAN, Adam Merritt ([id number]) happy with how things are at this time. There does not appear to be any evidence of infection currently. No fevers, chills, nausea, or vomiting noted at this time. 04/30/2018 patient seen today for follow-up and management of bilateral lower extremity lymphedema. He did express being more sad today than usual due to the recent loss of his dog. He states that he has been compliant with using the lymphedema pumps. However he does admit a minute over the last 2-3 days he has not been using the pumps due to the recent loss of his dog. At this time there is no drainage or open wounds to his lower extremities. The left leg edema is measuring smaller today. Still has a significant amount of edema on bilateral lower extremities With dry flaky skin. He will be referred to the lymphedema clinic for further management. Will continue 3 layer compression wraps and follow-up in 1-2 weeks.Denies any pain, fever, chairs recently. No recent falls or injuries reported during this visit. 05/07/18 on evaluation today patient actually appears to be doing very well in regard to his lower extremities in general all things considered. With that being said he is having some pain in the legs just due to the amount of swelling. He does have an area where he had a blister on the left lateral lower extremity this is open at this  point other than that there's nothing else weeping at this time. 05/14/18 on evaluation today patient actually appears to be doing excellent all things considered in regard to his lower extremities. He still has a couple areas of weeping on each leg which has continued to be the issue for him. He does have an appointment with the lymphedema clinic although this isn't until February 2020. That was the earliest they had. In the meantime he has continued to tolerate the compression wraps without complication. 05/28/18 on evaluation today patient actually appears to be doing more poorly in regard to his left lower extremity where he has a wound open at this point. He also had a fall where he subsequently injured his right great toe which has led to an open wound at the site unfortunately. He has been tolerating the dressing changes without complication in general as far as the wraps are concerned that he has not been putting any dressing on the left 1st toe ulcer site. 06/11/18 on evaluation today patient appears to be doing much worse in regard to his bilateral lower extremity ulcers. He has been tolerating the dressing changes without complication although  his legs have not been wrapped more recently. Overall I am not very pleased with the way his legs appear. I do believe he needs to be back in compression wraps he still has not received his compression wraps from the [facility] hospital as of yet. 06/18/18 on evaluation today patient actually appears to be doing significantly better than last time I saw him. He has been tolerating the compression wraps without complication in the circumferential ulcers especially appear to be doing much better. His toe ulcer on the right in regard to the great toe is better although not as good as the legs in my pinion. No fevers chills noted 07/02/18 on evaluation today patient appears to be doing much better in regard to his lower extremity ulcers. Unfortunately since I last  saw him he's had the distal portion of his right great toe if he dated it sounds as if this actually went downhill very quickly. I had only seen him a few days prior and the toe did not appear to be infected at that point subsequently became infected very rapidly and it was decided by the surgeon that the distal portion of the toe needed to be removed. The patient seems to be doing well in this regard he tells me. With that being said his lower extremities are doing better from the standpoint of the wounds although he is significantly swollen at this point. 07/09/18 on evaluation today patient appears to be doing better in regard to the wounds on his lower extremities. In fact everything is almost completely healed he is just a small area on the left posterior lower extremity that is open at this point. 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 With that being said he needs some kind of compression it does sound like he may not be wearing his compression, that is the wraps, during the entire time between when he's here visit to visit. Apparently his wife took the current one off because it began to "fall apart". 07/16/18 on evaluation today patient appears to be extremely swollen especially in regard to his left lower extremity unfortunately. He also has a new skin tear over the left lower extremity and there's a smaller area on the right lower extremity as well. Unfortunately this seems to be due in part to blistering and fluid buildup in his leg. He did get the reduction wraps that were ordered by the [facility] hospital for him to go to lymphedema clinic. With that being said his wounds on the legs have not healed to the point to where they would likely accept them as a patient lymphedema clinic currently. We need to try to  get this to heal. With that being said he's been taking his wraps off which is not doing him any favors at this point. In fact this is probably quite counterproductive compared to what needs to occur. We will likely need to increase to a four layer compression wrap and continue to also utilize elevation and he has to keep the wraps on not take them off as he's been doing currently hasn't had a wrap on since Saturday. 07/23/18 on evaluation today patient appears to be doing much better in regard to his bilateral lower extremities. In fact his left lower extremity which was the largest is actually 15 cm smaller today compared to  what it was last time he was here in our clinic. This is obviously good news after just one week. Nonetheless the differences he actually kept the wraps on during the entire week this time. That's not typical for him. I do believe he understands a little bit better now the severity of the situation and why it's important for him to keep these wraps on. 07/30/18 on evaluation today patient actually appears to be doing rather well in regard to his lower extremities. His legs are much smaller than they have been in the past and he actually has only one very small rudder superficial region remaining that is not closed on the left lateral/posterior lower extremity even this is almost completely close. I do believe likely next week he will be healed without any complications. I do think we need to continue the wraps however this seems to be beneficial for him. I also think it may be a good time for [place] to go ahead and see about getting the appointment with the lymphedema clinic which is supposed to be made for him in order to keep this moving along and hopefully get them into compression wraps that will in the end help him to remain healed. 08/06/18 on evaluation today patient actually appears to be doing very well in regard as bilateral lower extremities. In fact his wounds appear to  be completely healed at this time. 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 He is in the process of getting appointment with the lymphedema clinic we have made this referral were just waiting to hear back on the schedule time. We need to follow up on that today as well. 08/13/18 on evaluation today patient actually appears to be doing very well in regard to his bilateral lower extremities there are no open wounds at this point. We are gonna go ahead and see about ordering the Velcro compression wraps for him had a discussion with them about [place] doing it versus the [place] they feel like they can definitely afford going ahead and get the wraps themselves and they would prefer to try to avoid having to go to the lymphedema clinic if it all possible which I completely understand. As long as he has good compression I'm okay either way. 3/17//20 on evaluation today patient actually appears to be doing well in regard to his bilateral lower extremity ulcers. He has been tolerating the dressing changes without complication specifically the compression wraps. Overall is had no issues my fingers finding that I see at this point is that he is having trouble with constipation. He tells me he has not been able to go to the bathroom for about six days. He's taken to over-the- counter oral laxatives unfortunately this is not helping. He has not contacted his doctor. RENE, SIZELOVE ([id number]) 08/27/18 on evaluation today patient appears to be doing fairly well in regard to his lower extremities at this point. There does not appear to be any new altars is swelling is very well controlled. We are still waiting for his Velcro compression wraps to arrive that should be sometime in the next week is Artie sent the check for these. 09/03/18 on evaluation today patient appears to be doing excellent in regard to his bilateral lower extremities which he shows no signs of  wound openings in regard to this point. He does have his Velcro compression wraps which did arrive in the mail since I saw him last week. Overall  he is doing excellent in my pinion. 09/13/18 on evaluation today patient appears to be doing very well currently in regard to the overall appearance of his bilateral lower extremities although he's a little bit more swollen than last time we saw him. At that point he been discharged without any open wounds. Nonetheless he has a small open wound on the posterior left lower extremity with some evidence of cellulitis noted as well. Fortunately I feel like he has made good progress overall with regard to his lower extremities from were things used to be. 09/20/18 on evaluation today patient actually appears to be doing much better. The erythematous lower extremity is improving wound itself which is still open appears to be doing much better as far as but appearance as well as pain is concerned overall very pleased in this regard. There's no signs of active infection at this time. 09/27/18 on evaluation today patient's wounds on the lower extremity actually appear to be doing fairly well at this time which is good news. There is no evidence of active infection currently and again is just as left lower extremity were there any wounds at this point anyway. I believe they may be completely healed but again I'm not 100% sure based on evaluation today. I think one more week of observation would likely be a good idea. 10/04/18 on evaluation today patient actually appears to be doing excellent in regard to the left lower extremity which actually appears to be completely healed as of today. Unfortunately he's been having issues with his right lower extremity have a new wound that has opened. Fortunately there's no evidence of active infection at this time which is good news. 10/10/18 upon evaluation today patient actually appears to be doing a little bit worse with the new  open area on his right posterior lower extremity. He's been tolerating the dressing changes without complication. Right now we been using his compression wraps although I think we may need to switch back to actually performing bilateral compression wraps are in the clinic. No fevers, chills, nausea, or vomiting noted at this time. 10/17/18 on evaluation today patient actually appears to be doing quite well in regard to his bilateral lower extremity ulcers. He has been tolerating the reps without complication although he would prefer not to rewrap his legs as of today. Fortunately there's no signs of active infection which is good news. No fevers, chills, nausea, or vomiting noted at this time. 10/24/18 on evaluation today patient appears to be doing very well in regard to his lower extremities. His right lower extremity is shown signs of healing and his left lower [place] though not healed appears to be improving which is excellent news. Overall very pleased with how things seem to be progressing at this time. The patient is likewise happy to hear this. His Velcro compression wraps however have not been put on properly will gonna show his wife how to do that properly today. 10/31/18 on evaluation today patient appears to be doing more poorly in regard to his left lower extremity in particular although both lower extremities actually are showing some signs of being worse than my previous evaluation. Unfortunately I'm just not sure that his compression stockings even with the use of the compression/lymphedema pumps seem to be controlling this well. Upon further questioning he tells me that he also is not able to lie flat in the bed due to his congestive heart failure and difficulty breathing. For that reason he sleeps in his recliner.  To make matters worse his recliner also cannot even hold his legs up so instead of even being somewhat elevated they pretty much hang down to the floor. This is the way he  sleeps each night which is definitely counterproductive to everything else that were attempting to do from the standpoint of controlling his fluid. Nonetheless I think that he potentially could benefit from a hospital bed although this would be something that his primary care provider would likely have to order since anything that is order on our side has to be directly related to wound care and again the hospital bed is not necessarily a direct relation to although I think it does contribute to his overall wound status on his lower extremities. 11/07/18 on evaluation today patient appears to be doing better in regard to his bilateral lower extremity. He's been tolerating the dressing changes without complication. We did in the interim since I last saw him switch to just using extras orbiting new alginate and that seems to have done much better for him. I'm very pleased with the overall progress that is made. 11/14/18 on evaluation today patient appears to be redoing rather well in regard to his left lower extremity ulcers which are the only ones remaining at this point. Fortunately there's no signs of active infection at this time which is good news. Overall been very pleased with how things seem to be progressing currently. No fevers, chills, nausea, or vomiting noted at this time. 11/20/17 on evaluation today patient actually appears to be doing quite well in regard to his lower extremities on the left on the right he has several blisters that showed up although there's some question about whether or not he has had his broker compression wraps on like he was supposed to or not. Fortunately there's no signs of active infection at this time which is good news. Unfortunately though he is doing well from the standpoint of the left leg the right leg is not doing as well again this is when we did not wrap last week. 11/28/18 on evaluation today patient appears to be doing well in regard to his bilateral lower  extremities is left appears to be healed is right is not healed but is very close to being so. Overall very pleased with how things seem to be progressing. Patient is likewise happy that things are doing well. 12/09/18 on evaluation today patient actually appears to be completely healed which is excellent news. He actually seems to be doing well in regard to the swelling of the bilateral lower extremities which is also great news. Overall very pleased with how things seem to be progressing. Readmission: 03/18/2019 upon evaluation today patient presents for reevaluation concerning issues that he is having with his left lower extremity where he does have a wound noted upon inspection today. He also has a wound on the right second toe on the tip where it is apparently been rubbing in his shoe I did look at issues and you can actually see where this has been occurring as well. Fortunately there is no signs of infection with regard to the toe necessarily although it is very swollen compared to normal that does have me somewhat concerned about the possibility of further evaluation for infection/osteomyelitis. I would recommend an x-ray to start with. Otherwise he states that it is been 1-2 weeks that he has had the draining in regard to left lower extremity he does not know how this happened he has been wearing his compression  appropriately and I do see that as well today but nonetheless I do think that he needs to continue to be very cautious with regard to elevation as well. KEYWON, MESTRE ([id number]) 03/25/2019 on evaluation today patient appears to be doing much better in regard to his lower extremity at this time. That is on the left. He did culture positive for Pseudomonas but the good news is he seems to be doing much better the gentamicin we applied topically seems to have done a great job for him. There is no signs of active infection at this time systemically and locally he is doing much better.  No fevers, chills, nausea, vomiting, or diarrhea. In regard to his right toe this seems to be doing much better there is very little pressure noted at this point I did clean up some of the callus today around the edges of the wound as well as the surface of the wound but to be honest he is progressing quite nicely. 10/30; the area on the left anterior lower tibia area is healed over. His edema control is adequate even though his use of the compression pumps seems very intermittent. He has a juxta lite stocking on the right leg and a think there is 1 for the left leg at home. His wife is putting these on. He has an area on the plantar right second toe which is a hammertoe they are trying to offload this 04/08/2019 on evaluation today patient appears to be doing well in regard to his lower extremities which is good news. We are seeing him today for his toe ulcer which is giving him a little bit more trouble but again still seems to be doing better at this point which is good news. He is going require some sharp debridement in regard to the toe today however. 04/15/2019 on evaluation today patient actually appears to be doing quite well with regard to his toe ulcer. I am very pleased with how things are doing in that regard. With regard to his lower extremity edema in general he tells me he is not been using the lymphedema pumps regularly although he does not use them. I think that he needs to use them more regularly he does have a small blister on the left lower extremity this is not open at this point but obviously it something that can get worse if he does not keep the compression going and the pumps going as well. 04/22/2019 on evaluation today patient appears to be doing well with regard to his toe ulcer. He has been tolerating the dressing changes without complication. This is measuring slightly smaller this week as compared to last week. Fortunately there is no signs of active infection at this  time. 05/06/2019 on evaluation today patient actually appears to be doing excellent. In fact his toe appears to be completely healed which is great news. There is no signs of active infection at this time. Readmission: Patient presents today for follow-up after having been discharged at the beginning of December 2020. He states that in recent weeks things have reopened and has been having more trouble at this point. With that being said fortunately there is no signs of active infection at this time. No fever chills noted. Nonetheless he also does have an issue with one of his toes as well that seems to be somewhat this is on the right foot second toe. 07/25/2019 upon evaluation today patient's lower extremities appear to be doing excellent bilaterally. I really feel  like he is showing signs of great improvement and in fact I think he is close to healing which is great news. There is no signs of active infection at this time. No fevers, chills, nausea, vomiting, or diarrhea. 07/31/2019 upon evaluation today patient appears to be doing excellent and in fact I think may be completely healed in regard to his right lower extremity that were still to monitor this 1 more week before closing it out. The left lower extremity is measuring smaller although not completely closed seems to be doing excellent. 08/07/2019 upon evaluation today patient appears to be doing excellent in regard to his wounds. In fact the right lower extremity is completely healed there is no issues here. The left lower extremity has 1 very small area still remaining fortunately there is no signs of infection and overall I think he is very close to closure of the here as well. 08/14/19 upon evaluation today patient appears to be doing excellent in regard to his lower extremities. In fact he appears to be completely healed as of today. Fortunately there's no signs of active infection at this time. No fevers, chills, nausea, or vomiting noted  at this time. READMISSION 09/26/2019 This is a 75 year old man who is well-known to this clinic having just been discharged on 08/14/2019. He has known lymphedema and chronic venous insufficiency. He has Farrow wrap stockings and also external compression pumps. He does not use the external compression pumps however. He does however apparently fairly faithfully use the stockings. They have not been lotion in his legs. He has developed new wounds on the right anterior as well as the left medial left lateral and left posterior calf. He returns for our review of this Past medical history includes coronary artery disease, hypertension, congestive heart failure, COPD, venous insufficiency with lymphedema, type 2 diabetes. He apparently has compression pumps, Farrow wraps and at 1 point a hospital bed although I believe he sleeps in a recliner 10/03/2019 upon evaluation today patient appears to be doing better with regard to his wounds in general. He has been tolerating the dressing changes without complication. Fortunately there is no signs of active infection. No fevers, chills, nausea, vomiting, or diarrhea. I do believe the compression wraps are helping as they always have in the past. 10/10/2019 upon evaluation today patient appears to be doing excellent at this point. His right lower extremity is measuring much better and in fact is completely healed. His left lower extremity is also measuring better though not healed seems to be doing excellent at this point which is great news. Overall I am very pleased with how things appear currently. 10/27/2019 upon evaluation today patient appears to be doing quite well with regard to his wounds currently. He has been tolerating the dressing changes without complication. Fortunately there is no signs of active infection at this time. In fact he is almost completely healed on the left and has just a very small area open and on the right he is completely  healed. 11/04/2019 upon evaluation today patient appears to be doing quite well with regard to his wounds. In fact he appears to be quite possibly completely healed on the left lower extremity now as well the right lower extremity is still doing well. With that being said unfortunately though this may be healed I think it is a very fragile area and I cannot even confirm there is not a very small opening still remaining. I really think he may benefit from 1 additional weeks  wrapping before discontinuing. 11/10/2019 upon evaluation today patient appears to be doing well in regard to his original wounds in fact everything that we were treating last week is completely healed on the left. Unfortunately he has a new skin tear just above where the wrap slipped down due to a fall he sustained causing this injury. 11/17/2019 on evaluation today patient appears to be doing better with regard to his wound. He has been tolerating the dressing changes without NIKKI, RUSNAK. ([id number]) complication. Fortunately there is no signs of active infection at this time. No fevers, chills, nausea, vomiting, or diarrhea. 11/24/2019 upon evaluation today patient appears to be doing well with regard to his wound. This is making good progress there does not appear to be signs of active infection but overall I do feel like he is headed in the correct direction. Overall he is tolerating the compression wrap as well. 12/02/2019 upon evaluation today patient appears to be doing well with regard to his leg ulcer. In fact this appears to be completely healed and this is the last of the wounds that he had but still the left anterior lower extremity. Fortunately there is no signs of active infection at this time. No fevers, chills, nausea, vomiting, or diarrhea. Readmission: 02/05/2020 on evaluation today patient appears to be doing well with regard to his wound all things considered. He actually tells me that a couple weeks ago he had  trouble with his wraps and therefore took him off for a couple of days in order to give his legs a break. It was during this time that he actually developed increased swelling and edema of his legs and blisters on both legs. That is when he called to make the appointment. Nonetheless since that time he began wearing his compression wraps which are Velcro in the meantime and has done extremely well with this. In fact his leg appears to be under good control as far as edema is concerned today it is nothing like what I am seeing when he has come in for evaluations in the past. They have been using some of the silver alginate at home which has been beneficial for him. Fortunately there is no signs of active infection at this time. No fevers, chills, nausea, vomiting, or diarrhea. 02/19/2020 on evaluation today patient unfortunately does not appear to be doing quite as well as I would like to see. He has no signs of active infection at this time but unfortunately he is not doing well in regard to the overall appearance of his left leg. He has a couple other areas that are weeping now and the leg is much more swollen. I think that he needs to actually have a compression wrap to try to manage this. 02/26/2020 on evaluation today patient appears to be doing well with regard to his left lower extremity ulcer. Fortunately the swelling is down I do believe the pressure wrap seems to be doing quite well. Fortunately there is no signs of active infection at this time. 03/05/2020 upon evaluation today patient appears to be doing excellent in regard to his legs at this point. Fortunately there is no signs of active infection which is great news. Overall he feels like he is completely healed which is great news. Readmission: 05/20/2020 upon evaluation today patient appears to be doing well with regard to his leg ulcer all things considered. He is actually coming back to see [place] after having had a reopening of the ulcers  on his left  leg in the past several weeks. He is out of dressing supplies and his wife is been try to take care of this as best she can. 06/10/2020 upon evaluation today patient unfortunately appears to be doing significantly worse today compared to when I last saw him. He and his wife both tell me that "there has been a lot going on". I did not actually go into detail about everything that has been happening but nonetheless he has not obviously been wearing his compression. He brings them with him today but they were not on. I am not even certain to be honest that he could get those on at this point. Nonetheless I do believe that he is going to require compression wrapping at some point shortly but right now we need to try to get what appears to be infection under control at this time. 06/28/2020 upon evaluation today patient appears to be doing well with regard to 06/28/2020 upon evaluation today patient appears to be doing well pretty well in regard to his left leg which is much better than previous. With that being said in regard to his right leg he does seem to be having some issues with a new wound after he sustained a fall. This fortunately is not too deep but does appear to be something we need to address. Neither deep which is good. 07/05/2020 on evaluation today patient appears to be doing well with regard to his wounds. In fact everything on the left leg appears to be pretty much healed on the right leg this is very close though not completely closed yet. Fortunately there is no evidence of active infection at this time. No fevers, chills, nausea, vomiting, or diarrhea. 07/13/2019 upon evaluation today patient appears to be making good progress which is great news. Overall I am extremely pleased with where things stand today. There does not appear to be any signs of active infection which is great news and overall his left leg appears healed still the right leg though not healed does seem to be  making good progress which is excellent news. He is having no pain. 07/19/2020 on evaluation today patient appears to be doing well with regard to his wound. He has been tolerating the dressing changes without complication. Fortunately there does not appear to be any signs of active infection at this time. No fevers, chills, nausea, vomiting, or diarrhea. Readmission: 08/20/2020 on evaluation today patient presents for reevaluation here in the clinic concerning issues that he has been having with his bilateral lower extremities. Unfortunately this is an ongoing reoccurring issue. He tells me that he is really not certain exactly what caused the issue although as I discussed this further with him it appears that he has been sitting for the most part and sleeping in his chair with his feet on the ground he is not really elevating at all in fact the chair does not even have a reclining function therefore it is basically just him sitting with his feet on the ground. I think this alone has probably led to his increased swelling he is also having more difficulty walking which means that he is pumping less blood flow through his legs anyway as far as the venous stasis is concerned. All in all I think this is leading to a downward spiral of him not being able to walk very well as his legs are so heavy, they begin to weep, and overall he just does not seem to be doing as well as it  was when I last saw him. 08/27/2020 upon evaluation today patient's legs actually are showing signs of improvement which is great news. There does not appear to be any evidence of infection which is also excellent news. I am extremely pleased with where things stand today. No fevers, chills, nausea, vomiting, or diarrhea. 09/03/20 upon evaluation today patient appears to be doing excellent in regard to his wounds currently. He has been tolerating the dressing changes without complication and overall I am extremely pleased with where  things stand today. No fevers, chills, nausea, vomiting, or diarrhea. Overall he is healed on the right leg although the left leg not being completely healed still is doing significantly better. 09/10/2020/8/22 upon evaluation today patient appears to be doing well with regard to his wounds. He has been tolerating the dressing changes without complication. Fortunately he appears to be completely healed. I am very pleased in that regard. As is the patient his right leg is also doing well he did get a new recliner which is helping him keep his feet off the ground which I think is helped he is down 2 cm on the left compared to last week so I think there is some definite improvement here. LLOYDE, LUDLAM ([id number]) Readmission 12/15/20: Mr. Obbie Lewallen is a 75 year old male with a past medical history of lymphedema, insulin-dependent type 2 diabetes, COPD and congestive heart failure that presents to the clinic for increased drainage from his legs bilaterally, Left greater than right. Patient has lymphedema. He has been seen in our clinic on multiple occasions for this issue. He currently denies signs of infection. He has not been using his lymphedema pumps. 12/23/2020 upon evaluation today patient appears to be doing well with regard to his wounds. He has been tolerating the dressing changes he really does not have any open wounds just weeping areas of the legs bilaterally and fortunately seems to be doing better at this point. 12/28/2020 upon evaluation today patient's wound is actually showing signs of good improvement. There does not appear to be any evidence of infection which is great news and overall I am extremely pleased at this point. I think is very close to complete resolution. 01/04/2021 upon evaluation today patient actually appears to be completely healed which is great news. I do not see any signs of active infection currently which is also great news. In general I am extremely pleased with  where we stand and I do believe that the patient is tolerating the dressing changes without complication. I believe he is ready to go back into his Velcro compression and I did see that they brought them with him today. Electronic Signature(s) Signed: 01/04/2021 9:34:46 AM By: Worthy Keeler PA-C Entered By: Worthy Keeler on 01/04/2021 09:34:45 Adam Merritt ([id number]) -------------------------------------------------------------------------------- Physical Exam Details Patient Name: Adam Merritt Date of Service: 01/04/2021 8:00 AM Medical Record Number: [id number] Patient Account Number: [id number] Date of Birth/Sex: 19-Jan-1946 (75 y.o. M) Treating RN: Donnamarie Poag Primary Care Provider: [name] Other Clinician: Referring Provider: [name] Treating Provider/Extender: Jeri Cos [name]: 2 Constitutional Well-nourished and well-hydrated in no acute distress. Respiratory normal breathing without difficulty. Psychiatric this patient is able to make decisions and demonstrates good insight into disease process. Alert and Oriented x 3. pleasant and cooperative. Notes Upon inspection patient's wounds again are completely epithelialized and this is great news. I do not see any signs of active infection also great news. Electronic Signature(s) Signed: 01/04/2021 9:35:23 AM By: Melburn Hake,  Margarita Grizzle PA-C Entered By: Worthy Keeler on 01/04/2021 09:35:23 Adam Merritt ([id number]) -------------------------------------------------------------------------------- Physician Orders Details Patient Name: Adam Merritt Date of Service: 01/04/2021 8:00 AM Medical Record Number: [id number] Patient Account Number: [id number] Date of Birth/Sex: 15-Apr-1946 (75 y.o. M) Treating RN: Donnamarie Poag Primary Care Provider: [name] Other Clinician: Referring Provider: [name] Treating Provider/Extender: Skipper Cliche in Treatment: 2 Verbal / Phone Orders: No Diagnosis  Coding ICD-10 Coding Code Description I89.0 Lymphedema, not elsewhere classified I87.2 Venous insufficiency (chronic) (peripheral) Discharge From [facility] o Discharge from [facility] o Elevate, Exercise Daily and Avoid Standing for Long Periods of Time. o DO YOUR BEST to sleep in the bed at night. DO NOT sleep in your recliner. Long hours of sitting in a recliner leads to swelling of the legs and/or potential wounds on your backside. Edema Control - Lymphedema / Segmental Compressive Device / Other Bilateral Lower Extremities o Tubigrip single layer applied. - Used as the sock compression layer under your wraps-purchase new socks for under wraps o Patient to wear own Velcro compression garment. Remove compression stockings every night before going to bed and put on every morning when getting up. o Elevate legs to the level of the heart and pump ankles as often as possible o Compression Pump: Use compression pump on left lower extremity for 60 minutes, twice daily. o Compression Pump: Use compression pump on right lower extremity for 60 minutes, twice daily. Electronic Signature(s) Signed: 01/04/2021 1:44:11 PM By: Donnamarie Poag Signed: 01/04/2021 6:20:17 PM By: Worthy Keeler PA-C Entered By: Donnamarie Poag on 01/04/2021 08:28:12 JARAY, BOLIVER ([id number]) -------------------------------------------------------------------------------- Problem List Details Patient Name: Adam Merritt Date of Service: 01/04/2021 8:00 AM Medical Record Number: [id number] Patient Account Number: [id number] Date of Birth/Sex: August 17, 1945 (75 y.o. M) Treating RN: Donnamarie Poag Primary Care Provider: [name] Other Clinician: Referring Provider: [name] Treating Provider/Extender: Skipper Cliche in Treatment: 2 Active Problems ICD-10 Encounter Code Description Active Date MDM Diagnosis I89.0 Lymphedema, not elsewhere classified 12/15/2020 No Yes I87.2  Venous insufficiency (chronic) (peripheral) 12/28/2020 No Yes Inactive Problems Resolved Problems Electronic Signature(s) Signed: 01/04/2021 8:19:45 AM By: Worthy Keeler PA-C Entered By: Worthy Keeler on 01/04/2021 08:19:44 Adam Merritt ([id number]) -------------------------------------------------------------------------------- Progress Note Details Patient Name: Adam Merritt Date of Service: 01/04/2021 8:00 AM Medical Record Number: [id number] Patient Account Number: [id number] Date of Birth/Sex: 01/21/1946 (75 y.o. M) Treating RN: Donnamarie Poag Primary Care Provider: [name] Other Clinician: Referring Provider: [name] Treating Provider/Extender: Skipper Cliche in Treatment: 2 Subjective Chief Complaint Information obtained from Patient Bilateral LE edema and weeping History of Present Illness (HPI) 10/18/17-He is here for initial evaluation of bilateral lower extremity ulcerations in the presence of venous insufficiency and lymphedema. He has been seen by vascular medicine in the past, Dr. Lucky Cowboy, last seen in 2016. He does have a history of abnormal ABIs, which is to be expected given his lymphedema and venous insufficiency. According to Epic, it appears that all attempts for arterial evaluation and/or angiography were not follow through with by patient. He does have a history of being seen in lymphedema clinic in 2018, stopped going approximately 6 months ago stating "it didn't do any good". He does not have lymphedema pumps, he does not have custom fit compression wrap/stockings. He is diabetic and his recent A1c last month was 7.6. He admits to chronic bilateral lower extremity pain, no change in pain since blister and ulceration development.  He is currently being treated with Levaquin for bronchitis. He has home health and we will continue. 10/25/17-He is here in follow-up evaluation for bilateral lower extremity ulcerationssubtle he remains on Levaquin for bronchitis.  Right lower extremity with no evidence of drainage or ulceration, persistent left lower extremity ulceration. He states that home health has not been out since his appointment. He went to [facility] on Tuesday, studies revealed: RIGHT ABI 0.9, TBI 0.6 LEFT ABI 1.1, TBI 0.6 with triphasic flow bilaterally. We will continue his same treatment plan. He has been educated on compression therapy and need for elevation. He will benefit from lymphedema pumps 11/01/17-He is here in follow-up evaluation for left lower extremity ulcer. The right lower extremity remains healed. He has home health services, but they have not been out to see the patient for 2-3 weeks. He states it home health physical therapy changed his dressing yesterday after therapy; he placed Ace wrap compression. We are still waiting for lymphedema pumps, reordered d/t need for company change. 11/08/17-He is here in follow-up evaluation for left lower extremity ulcer. It is improved. Edema is significantly improved with compression therapy. We will continue with same treatment plan and he will follow-up next week. No word regarding lymphedema pumps 11/15/17-He is here in follow-up evaluation for left lower extremity ulcer. He is healed and will be discharged from wound care services. I have reached out to [facility] regarding his lymphedema pumps. They have been unable to reach the patient; the contact number they had with the patient's wife's cell phone and she has not answered any unrecognized calls. Contact should be made today, trial planned for next week; [facility] will continue to follow 11/27/17 on evaluation today patient has multiple blistered areas over the right lower extremity his left lower extremity appears to be doing okay. These blistered areas show signs of no infection which is great news. With that being said he did have some necrotic skin overlying which was mechanically debrided away with saline  and gauze today without complication. Overall post debridement the wounds appear to be doing better but in general his swelling seems to be increased. This is obviously not good news. I think this is what has given rise to the blisters. 12/04/17 on evaluation today patient presents for follow-up concerning his bilateral lower extremity edema in the right lower extremity ulcers. He has been tolerating the dressing changes without complication. With that being said he has had no real issues with the wraps which is also good news. Overall I'm pleased with the progress he's been making. 12/11/17 on evaluation today patient appears to be doing rather well in regard to his right lateral lower extremity ulcer. He's been tolerating the dressing changes without complication. Fortunately there does not appear to be any evidence of infection at this time. Overall I'm pleased with the progress that is being made. Unfortunately he has been in the hospital due to having what sounds to be a stomach virus/flu fortunately that is starting to get better. 12/18/17 on evaluation today patient actually appears to be doing very well in regard to his bilateral lower extremities the swelling is under fairly good control his lymphedema pumps are still not up and running quite yet. With that being said he does have several areas of opening noted as far as wounds are concerned mainly over the left lower extremity. With that being said I do believe once he gets lymphedema pumps this would least help you mention some the  fluid and preventing this from occurring. Hopefully that will be set up soon sleeves are Artie in place at his home he just waiting for the machine. 12/25/17 on evaluation today patient actually appears to be doing excellent in fact all of his ulcers appear to have resolved his legs appear very well. I do think he needs compression stockings we have discussed this and they are actually going to go to [facility] today to  elastic therapy to get this fitted for him. I think that is definitely a good thing to do. Readmission: 04/09/18 upon evaluation today this patient is seen for readmission due to bilateral lower extremity lymphedema. He has significant swelling of his extremities especially on the left although the right is also swollen he has weeping from both sides. There are no obvious open wounds at this point. Fortunately he has been doing fairly well for quite a bit of time since I last saw him. Nonetheless unfortunately this seems to have reopened and is giving quite a bit of trouble. He states this began about a week ago when he first called [place] to get in to be seen. No fevers, chills, nausea, or vomiting noted at this time. He has not been using his lymphedema pumps due to the fact that they won't fit on his leg at this point likewise is also not been using his compression for essentially the same reason. 04/16/18 upon evaluation today patient actually appears to be doing a little better in regard to the fluid in his bilateral lower extremities. With that being said he's had three falls since I saw him last week. He also states that he's been feeling very poorly. I was concerned last week and feel like that the concern is still there as far as the congestion in his chest is concerned he seems to be breathing about the same as last week but again he states he's very weak he's not even able to walk further than from the chair to the door. His wife had to buy a wheelchair just to be able to get them out of the house to get to the appointment today. This has me very concerned. BHAVESH, VAZQUEZ ([id number]) 04/23/18 on evaluation today patient actually appears to be doing much better than last week's evaluation. At that time actually had to transport him to the ER via EMS and he subsequently was admitted for acute pulmonary edema, acute renal failure, and acute congestive heart failure. Fortunately he is doing much  better. Apparently they did dialyze him and were able to take off roughly 35 pounds of fluid. Nonetheless he is feeling much better both in regard to his breathing and he's able to get around much better at this time compared to previous. Overall I'm very happy with how things are at this time. There does not appear to be any evidence of infection currently. No fevers, chills, nausea, or vomiting noted at this time. 04/30/2018 patient seen today for follow-up and management of bilateral lower extremity lymphedema. He did express being more sad today than usual due to the recent loss of his dog. He states that he has been compliant with using the lymphedema pumps. However he does admit a minute over the last 2-3 days he has not been using the pumps due to the recent loss of his dog. At this time there is no drainage or open wounds to his lower extremities. The left leg edema is measuring smaller today. Still has a significant amount of edema on  bilateral lower extremities With dry flaky skin. He will be referred to the lymphedema clinic for further management. Will continue 3 layer compression wraps and follow-up in 1-2 weeks.Denies any pain, fever, chairs recently. No recent falls or injuries reported during this visit. 05/07/18 on evaluation today patient actually appears to be doing very well in regard to his lower extremities in general all things considered. With that being said he is having some pain in the legs just due to the amount of swelling. He does have an area where he had a blister on the left lateral lower extremity this is open at this point other than that there's nothing else weeping at this time. 05/14/18 on evaluation today patient actually appears to be doing excellent all things considered in regard to his lower extremities. He still has a couple areas of weeping on each leg which has continued to be the issue for him. He does have an appointment with the lymphedema  clinic although this isn't until February 2020. That was the earliest they had. In the meantime he has continued to tolerate the compression wraps without complication. 05/28/18 on evaluation today patient actually appears to be doing more poorly in regard to his left lower extremity where he has a wound open at this point. He also had a fall where he subsequently injured his right great toe which has led to an open wound at the site unfortunately. He has been tolerating the dressing changes without complication in general as far as the wraps are concerned that he has not been putting any dressing on the left 1st toe ulcer site. 06/11/18 on evaluation today patient appears to be doing much worse in regard to his bilateral lower extremity ulcers. He has been tolerating the dressing changes without complication although his legs have not been wrapped more recently. Overall I am not very pleased with the way his legs appear. I do believe he needs to be back in compression wraps he still has not received his compression wraps from the [facility] hospital as of yet. 06/18/18 on evaluation today patient actually appears to be doing significantly better than last time I saw him. He has been tolerating the compression wraps without complication in the circumferential ulcers especially appear to be doing much better. His toe ulcer on the right in regard to the great toe is better although not as good as the legs in my pinion. No fevers chills noted 07/02/18 on evaluation today patient appears to be doing much better in regard to his lower extremity ulcers. Unfortunately since I last saw him he's had the distal portion of his right great toe if he dated it sounds as if this actually went downhill very quickly. I had only seen him a few days prior and the toe did not appear to be infected at that point subsequently became infected very rapidly and it was decided by the surgeon that the distal portion of the toe needed to  be removed. The patient seems to be doing well in this regard he tells me. With that being said his lower extremities are doing better from the standpoint of the wounds although he is significantly swollen at this point. 07/09/18 on evaluation today patient appears to be doing better in regard to the wounds on his lower extremities. In fact everything is almost completely healed he is just a small area on the left posterior lower extremity that is open at this point. 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 With that being said he needs some kind of compression it does sound like he may not be wearing his compression, that is the wraps, during the entire time between when he's here visit to visit. Apparently his wife took the current one off because it began to "fall apart". 07/16/18 on evaluation today patient appears to be extremely swollen especially in regard to his left lower extremity unfortunately. He also has a new skin tear over the left lower extremity and there's a smaller area on the right lower extremity as well. Unfortunately this seems to be due in part to blistering and fluid buildup in his leg. He did get the reduction wraps that were ordered by the [facility] hospital for him to go to lymphedema clinic. With that being said his wounds on the legs have not healed to the point to where they would likely accept them as a patient lymphedema clinic currently. We need to try to get this to heal. With that being said he's been taking his wraps off which is not doing him any favors at this point. In fact this is probably quite counterproductive compared to what needs to occur. We will likely need to increase to a four layer compression wrap and continue to also utilize elevation and he has to keep the wraps on not take  them off as he's been doing currently hasn't had a wrap on since Saturday. 07/23/18 on evaluation today patient appears to be doing much better in regard to his bilateral lower extremities. In fact his left lower extremity which was the largest is actually 15 cm smaller today compared to what it was last time he was here in our clinic. This is obviously good news after just one week. Nonetheless the differences he actually kept the wraps on during the entire week this time. That's not typical for him. I do believe he understands a little bit better now the severity of the situation and why it's important for him to keep these wraps on. 07/30/18 on evaluation today patient actually appears to be doing rather well in regard to his lower extremities. His legs are much smaller than they have been in the past and he actually has only one very small rudder superficial region remaining that is not closed on the left lateral/posterior lower extremity even this is almost completely close. I do believe likely next week he will be healed without any complications. I do think we need to continue the wraps however this seems to be beneficial for him. I also think it may be a good time for [place] to go ahead and see about getting the appointment with the lymphedema clinic which is supposed to be made for him in order to keep this moving along and hopefully get them into compression wraps that will in the end help him to remain healed. 08/06/18 on evaluation today patient actually appears to be doing very well in regard as bilateral lower extremities. In fact his wounds appear to be completely healed at this time. 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 He is in the process of getting appointment with the lymphedema clinic we have made this referral were just waiting to hear back on the schedule time. We need to follow up on that today as well. 08/13/18 on  evaluation today patient actually appears to be doing very well in regard to his bilateral lower extremities there are no open wounds at this point. We are gonna go ahead and see about ordering the Velcro compression wraps for him had a discussion with them about [place] doing it versus the [place] they feel like they can definitely afford going ahead and get the wraps themselves and they would prefer to try to avoid having to go to the lymphedema clinic if it all possible which I completely understand. As long as he has good compression I'm okay either way. ZAVIEN, CLUBB ([id number]) 3/17//20 on evaluation today patient actually appears to be doing well in regard to his bilateral lower extremity ulcers. He has been tolerating the dressing changes without complication specifically the compression wraps. Overall is had no issues my fingers finding that I see at this point is that he is having trouble with constipation. He tells me he has not been able to go to the bathroom for about six days. He's taken to over-the- counter oral laxatives unfortunately this is not helping. He has not contacted his doctor. 08/27/18 on evaluation today patient appears to be doing fairly well in regard to his lower extremities at this point. There does not appear to be any new altars is swelling is very well controlled. We are still waiting for his Velcro compression wraps to arrive that should be sometime in the next week is Artie sent the check for these. 09/03/18 on evaluation today patient appears to be doing excellent in regard to his bilateral lower extremities which he shows no signs of wound openings in regard to this point. He does have his Velcro compression wraps which did arrive in the mail since I saw him last week. Overall he is doing excellent in my pinion. 09/13/18 on evaluation today patient appears to be doing very well currently in regard to the overall appearance of his bilateral lower extremities although he's a  little bit more swollen than last time we saw him. At that point he been discharged without any open wounds. Nonetheless he has a small open wound on the posterior left lower extremity with some evidence of cellulitis noted as well. Fortunately I feel like he has made good progress overall with regard to his lower extremities from were things used to be. 09/20/18 on evaluation today patient actually appears to be doing much better. The erythematous lower extremity is improving wound itself which is still open appears to be doing much better as far as but appearance as well as pain is concerned overall very pleased in this regard. There's no signs of active infection at this time. 09/27/18 on evaluation today patient's wounds on the lower extremity actually appear to be doing fairly well at this time which is good news. There is no evidence of active infection currently and again is just as left lower extremity were there any wounds at this point anyway. I believe they may be completely healed but again I'm not 100% sure based on evaluation today. I think one more week of observation would likely be a good idea. 10/04/18 on evaluation today patient actually appears to be doing excellent in regard to the left lower extremity which actually appears to be completely healed as of today. Unfortunately he's been having issues with his right lower extremity have a new wound that has opened. Fortunately there's no evidence of active infection at this time which is good news. 10/10/18 upon evaluation today patient actually  appears to be doing a little bit worse with the new open area on his right posterior lower extremity. He's been tolerating the dressing changes without complication. Right now we been using his compression wraps although I think we may need to switch back to actually performing bilateral compression wraps are in the clinic. No fevers, chills, nausea, or vomiting noted at this time. 10/17/18 on  evaluation today patient actually appears to be doing quite well in regard to his bilateral lower extremity ulcers. He has been tolerating the reps without complication although he would prefer not to rewrap his legs as of today. Fortunately there's no signs of active infection which is good news. No fevers, chills, nausea, or vomiting noted at this time. 10/24/18 on evaluation today patient appears to be doing very well in regard to his lower extremities. His right lower extremity is shown signs of healing and his left lower Trinity though not healed appears to be improving which is excellent news. Overall very pleased with how things seem to be progressing at this time. The patient is likewise happy to hear this. His Velcro compression wraps however have not been put on properly will gonna show his wife how to do that properly today. 10/31/18 on evaluation today patient appears to be doing more poorly in regard to his left lower extremity in particular although both lower extremities actually are showing some signs of being worse than my previous evaluation. Unfortunately I'm just not sure that his compression stockings even with the use of the compression/lymphedema pumps seem to be controlling this well. Upon further questioning he tells me that he also is not able to lie flat in the bed due to his congestive heart failure and difficulty breathing. For that reason he sleeps in his recliner. To make matters worse his recliner also cannot even hold his legs up so instead of even being somewhat elevated they pretty much hang down to the floor. This is the way he sleeps each night which is definitely counterproductive to everything else that were attempting to do from the standpoint of controlling his fluid. Nonetheless I think that he potentially could benefit from a hospital bed although this would be something that his primary care provider would likely have to order since anything that is order on  our side has to be directly related to wound care and again the hospital bed is not necessarily a direct relation to although I think it does contribute to his overall wound status on his lower extremities. 11/07/18 on evaluation today patient appears to be doing better in regard to his bilateral lower extremity. He's been tolerating the dressing changes without complication. We did in the interim since I last saw him switch to just using extras orbiting new alginate and that seems to have done much better for him. I'm very pleased with the overall progress that is made. 11/14/18 on evaluation today patient appears to be redoing rather well in regard to his left lower extremity ulcers which are the only ones remaining at this point. Fortunately there's no signs of active infection at this time which is good news. Overall been very pleased with how things seem to be progressing currently. No fevers, chills, nausea, or vomiting noted at this time. 11/20/17 on evaluation today patient actually appears to be doing quite well in regard to his lower extremities on the left on the right he has several blisters that showed up although there's some question about whether or not he has had  his broker compression wraps on like he was supposed to or not. Fortunately there's no signs of active infection at this time which is good news. Unfortunately though he is doing well from the standpoint of the left leg the right leg is not doing as well again this is when we did not wrap last week. 11/28/18 on evaluation today patient appears to be doing well in regard to his bilateral lower extremities is left appears to be healed is right is not healed but is very close to being so. Overall very pleased with how things seem to be progressing. Patient is likewise happy that things are doing well. 12/09/18 on evaluation today patient actually appears to be completely healed which is excellent news. He actually seems to be doing  well in regard to the swelling of the bilateral lower extremities which is also great news. Overall very pleased with how things seem to be progressing. Readmission: 03/18/2019 upon evaluation today patient presents for reevaluation concerning issues that he is having with his left lower extremity where he does have a wound noted upon inspection today. He also has a wound on the right second toe on the tip where it is apparently been rubbing in his shoe I did look at issues and you can actually see where this has been occurring as well. Fortunately there is no signs of infection with regard to TAEVYN, HAUSEN. ([id number]) the toe necessarily although it is very swollen compared to normal that does have me somewhat concerned about the possibility of further evaluation for infection/osteomyelitis. I would recommend an x-ray to start with. Otherwise he states that it is been 1-2 weeks that he has had the draining in regard to left lower extremity he does not know how this happened he has been wearing his compression appropriately and I do see that as well today but nonetheless I do think that he needs to continue to be very cautious with regard to elevation as well. 03/25/2019 on evaluation today patient appears to be doing much better in regard to his lower extremity at this time. That is on the left. He did culture positive for Pseudomonas but the good news is he seems to be doing much better the gentamicin we applied topically seems to have done a great job for him. There is no signs of active infection at this time systemically and locally he is doing much better. No fevers, chills, nausea, vomiting, or diarrhea. In regard to his right toe this seems to be doing much better there is very little pressure noted at this point I did clean up some of the callus today around the edges of the wound as well as the surface of the wound but to be honest he is progressing quite nicely. 10/30; the area on the  left anterior lower tibia area is healed over. His edema control is adequate even though his use of the compression pumps seems very intermittent. He has a juxta lite stocking on the right leg and a think there is 1 for the left leg at home. His wife is putting these on. He has an area on the plantar right second toe which is a hammertoe they are trying to offload this 04/08/2019 on evaluation today patient appears to be doing well in regard to his lower extremities which is good news. We are seeing him today for his toe ulcer which is giving him a little bit more trouble but again still seems to be doing better at this  point which is good news. He is going require some sharp debridement in regard to the toe today however. 04/15/2019 on evaluation today patient actually appears to be doing quite well with regard to his toe ulcer. I am very pleased with how things are doing in that regard. With regard to his lower extremity edema in general he tells me he is not been using the lymphedema pumps regularly although he does not use them. I think that he needs to use them more regularly he does have a small blister on the left lower extremity this is not open at this point but obviously it something that can get worse if he does not keep the compression going and the pumps going as well. 04/22/2019 on evaluation today patient appears to be doing well with regard to his toe ulcer. He has been tolerating the dressing changes without complication. This is measuring slightly smaller this week as compared to last week. Fortunately there is no signs of active infection at this time. 05/06/2019 on evaluation today patient actually appears to be doing excellent. In fact his toe appears to be completely healed which is great news. There is no signs of active infection at this time. Readmission: Patient presents today for follow-up after having been discharged at the beginning of December 2020. He states that in recent  weeks things have reopened and has been having more trouble at this point. With that being said fortunately there is no signs of active infection at this time. No fever chills noted. Nonetheless he also does have an issue with one of his toes as well that seems to be somewhat this is on the right foot second toe. 07/25/2019 upon evaluation today patient's lower extremities appear to be doing excellent bilaterally. I really feel like he is showing signs of great improvement and in fact I think he is close to healing which is great news. There is no signs of active infection at this time. No fevers, chills, nausea, vomiting, or diarrhea. 07/31/2019 upon evaluation today patient appears to be doing excellent and in fact I think may be completely healed in regard to his right lower extremity that were still to monitor this 1 more week before closing it out. The left lower extremity is measuring smaller although not completely closed seems to be doing excellent. 08/07/2019 upon evaluation today patient appears to be doing excellent in regard to his wounds. In fact the right lower extremity is completely healed there is no issues here. The left lower extremity has 1 very small area still remaining fortunately there is no signs of infection and overall I think he is very close to closure of the here as well. 08/14/19 upon evaluation today patient appears to be doing excellent in regard to his lower extremities. In fact he appears to be completely healed as of today. Fortunately there's no signs of active infection at this time. No fevers, chills, nausea, or vomiting noted at this time. READMISSION 09/26/2019 This is a 75 year old man who is well-known to this clinic having just been discharged on 08/14/2019. He has known lymphedema and chronic venous insufficiency. He has Farrow wrap stockings and also external compression pumps. He does not use the external compression pumps however. He does however apparently  fairly faithfully use the stockings. They have not been lotion in his legs. He has developed new wounds on the right anterior as well as the left medial left lateral and left posterior calf. He returns for our review of this  Past medical history includes coronary artery disease, hypertension, congestive heart failure, COPD, venous insufficiency with lymphedema, type 2 diabetes. He apparently has compression pumps, Farrow wraps and at 1 point a hospital bed although I believe he sleeps in a recliner 10/03/2019 upon evaluation today patient appears to be doing better with regard to his wounds in general. He has been tolerating the dressing changes without complication. Fortunately there is no signs of active infection. No fevers, chills, nausea, vomiting, or diarrhea. I do believe the compression wraps are helping as they always have in the past. 10/10/2019 upon evaluation today patient appears to be doing excellent at this point. His right lower extremity is measuring much better and in fact is completely healed. His left lower extremity is also measuring better though not healed seems to be doing excellent at this point which is great news. Overall I am very pleased with how things appear currently. 10/27/2019 upon evaluation today patient appears to be doing quite well with regard to his wounds currently. He has been tolerating the dressing changes without complication. Fortunately there is no signs of active infection at this time. In fact he is almost completely healed on the left and has just a very small area open and on the right he is completely healed. 11/04/2019 upon evaluation today patient appears to be doing quite well with regard to his wounds. In fact he appears to be quite possibly completely healed on the left lower extremity now as well the right lower extremity is still doing well. With that being said unfortunately though this may be healed I think it is a very fragile area and I cannot  even confirm there is not a very small opening still remaining. I really think he may benefit from 1 additional weeks wrapping before discontinuing. LAUREN, AGUAYO ([id number]) 11/10/2019 upon evaluation today patient appears to be doing well in regard to his original wounds in fact everything that we were treating last week is completely healed on the left. Unfortunately he has a new skin tear just above where the wrap slipped down due to a fall he sustained causing this injury. 11/17/2019 on evaluation today patient appears to be doing better with regard to his wound. He has been tolerating the dressing changes without complication. Fortunately there is no signs of active infection at this time. No fevers, chills, nausea, vomiting, or diarrhea. 11/24/2019 upon evaluation today patient appears to be doing well with regard to his wound. This is making good progress there does not appear to be signs of active infection but overall I do feel like he is headed in the correct direction. Overall he is tolerating the compression wrap as well. 12/02/2019 upon evaluation today patient appears to be doing well with regard to his leg ulcer. In fact this appears to be completely healed and this is the last of the wounds that he had but still the left anterior lower extremity. Fortunately there is no signs of active infection at this time. No fevers, chills, nausea, vomiting, or diarrhea. Readmission: 02/05/2020 on evaluation today patient appears to be doing well with regard to his wound all things considered. He actually tells me that a couple weeks ago he had trouble with his wraps and therefore took him off for a couple of days in order to give his legs a break. It was during this time that he actually developed increased swelling and edema of his legs and blisters on both legs. That is when he called to make  the appointment. Nonetheless since that time he began wearing his compression wraps which are Velcro in  the meantime and has done extremely well with this. In fact his leg appears to be under good control as far as edema is concerned today it is nothing like what I am seeing when he has come in for evaluations in the past. They have been using some of the silver alginate at home which has been beneficial for him. Fortunately there is no signs of active infection at this time. No fevers, chills, nausea, vomiting, or diarrhea. 02/19/2020 on evaluation today patient unfortunately does not appear to be doing quite as well as I would like to see. He has no signs of active infection at this time but unfortunately he is not doing well in regard to the overall appearance of his left leg. He has a couple other areas that are weeping now and the leg is much more swollen. I think that he needs to actually have a compression wrap to try to manage this. 02/26/2020 on evaluation today patient appears to be doing well with regard to his left lower extremity ulcer. Fortunately the swelling is down I do believe the pressure wrap seems to be doing quite well. Fortunately there is no signs of active infection at this time. 03/05/2020 upon evaluation today patient appears to be doing excellent in regard to his legs at this point. Fortunately there is no signs of active infection which is great news. Overall he feels like he is completely healed which is great news. Readmission: 05/20/2020 upon evaluation today patient appears to be doing well with regard to his leg ulcer all things considered. He is actually coming back to see [place] after having had a reopening of the ulcers on his left leg in the past several weeks. He is out of dressing supplies and his wife is been try to take care of this as best she can. 06/10/2020 upon evaluation today patient unfortunately appears to be doing significantly worse today compared to when I last saw him. He and his wife both tell me that "there has been a lot going on". I did not actually go  into detail about everything that has been happening but nonetheless he has not obviously been wearing his compression. He brings them with him today but they were not on. I am not even certain to be honest that he could get those on at this point. Nonetheless I do believe that he is going to require compression wrapping at some point shortly but right now we need to try to get what appears to be infection under control at this time. 06/28/2020 upon evaluation today patient appears to be doing well with regard to 06/28/2020 upon evaluation today patient appears to be doing well pretty well in regard to his left leg which is much better than previous. With that being said in regard to his right leg he does seem to be having some issues with a new wound after he sustained a fall. This fortunately is not too deep but does appear to be something we need to address. Neither deep which is good. 07/05/2020 on evaluation today patient appears to be doing well with regard to his wounds. In fact everything on the left leg appears to be pretty much healed on the right leg this is very close though not completely closed yet. Fortunately there is no evidence of active infection at this time. No fevers, chills, nausea, vomiting, or diarrhea. 07/13/2019 upon evaluation  today patient appears to be making good progress which is great news. Overall I am extremely pleased with where things stand today. There does not appear to be any signs of active infection which is great news and overall his left leg appears healed still the right leg though not healed does seem to be making good progress which is excellent news. He is having no pain. 07/19/2020 on evaluation today patient appears to be doing well with regard to his wound. He has been tolerating the dressing changes without complication. Fortunately there does not appear to be any signs of active infection at this time. No fevers, chills, nausea, vomiting, or  diarrhea. Readmission: 08/20/2020 on evaluation today patient presents for reevaluation here in the clinic concerning issues that he has been having with his bilateral lower extremities. Unfortunately this is an ongoing reoccurring issue. He tells me that he is really not certain exactly what caused the issue although as I discussed this further with him it appears that he has been sitting for the most part and sleeping in his chair with his feet on the ground he is not really elevating at all in fact the chair does not even have a reclining function therefore it is basically just him sitting with his feet on the ground. I think this alone has probably led to his increased swelling he is also having more difficulty walking which means that he is pumping less blood flow through his legs anyway as far as the venous stasis is concerned. All in all I think this is leading to a downward spiral of him not being able to walk very well as his legs are so heavy, they begin to weep, and overall he just does not seem to be doing as well as it was when I last saw him. 08/27/2020 upon evaluation today patient's legs actually are showing signs of improvement which is great news. There does not appear to be any evidence of infection which is also excellent news. I am extremely pleased with where things stand today. No fevers, chills, nausea, vomiting, or diarrhea. 09/03/20 upon evaluation today patient appears to be doing excellent in regard to his wounds currently. He has been tolerating the dressing changes without complication and overall I am extremely pleased with where things stand today. No fevers, chills, nausea, vomiting, or diarrhea. Overall he is healed on the right leg although the left leg not being completely healed still is doing significantly better. DAIRON, Adam Merritt ([id number]) 09/10/2020/8/22 upon evaluation today patient appears to be doing well with regard to his wounds. He has been tolerating the  dressing changes without complication. Fortunately he appears to be completely healed. I am very pleased in that regard. As is the patient his right leg is also doing well he did get a new recliner which is helping him keep his feet off the ground which I think is helped he is down 2 cm on the left compared to last week so I think there is some definite improvement here. Readmission 12/15/20: Mr. Cortney Adam Merritt is a 75 year old male with a past medical history of lymphedema, insulin-dependent type 2 diabetes, COPD and congestive heart failure that presents to the clinic for increased drainage from his legs bilaterally, Left greater than right. Patient has lymphedema. He has been seen in our clinic on multiple occasions for this issue. He currently denies signs of infection. He has not been using his lymphedema pumps. 12/23/2020 upon evaluation today patient appears to be doing well with  regard to his wounds. He has been tolerating the dressing changes he really does not have any open wounds just weeping areas of the legs bilaterally and fortunately seems to be doing better at this point. 12/28/2020 upon evaluation today patient's wound is actually showing signs of good improvement. There does not appear to be any evidence of infection which is great news and overall I am extremely pleased at this point. I think is very close to complete resolution. 01/04/2021 upon evaluation today patient actually appears to be completely healed which is great news. I do not see any signs of active infection currently which is also great news. In general I am extremely pleased with where we stand and I do believe that the patient is tolerating the dressing changes without complication. I believe he is ready to go back into his Velcro compression and I did see that they brought them with him today. Objective Constitutional Well-nourished and well-hydrated in no acute distress. Vitals Time Taken: 8:11 AM, Temperature: 98.2  F, Pulse: 62 bpm, Respiratory Rate: 20 breaths/min, Blood Pressure: 123/56 mmHg. Respiratory normal breathing without difficulty. Psychiatric this patient is able to make decisions and demonstrates good insight into disease process. Alert and Oriented x 3. pleasant and cooperative. General Notes: Upon inspection patient's wounds again are completely epithelialized and this is great news. I do not see any signs of active infection also great news. Other Condition(s) Patient presents with Lymphedema located on the Right Leg. Patient presents with Lymphedema located on the Left Leg. Assessment Active Problems ICD-10 Lymphedema, not elsewhere classified Venous insufficiency (chronic) (peripheral) Plan Discharge From [facility] Services: Discharge from [facility] Complete Elevate, Exercise Daily and Avoid Standing for Long Periods of Time. KEEVON, HENNEY ([id number]) DO YOUR BEST to sleep in the bed at night. DO NOT sleep in your recliner. Long hours of sitting in a recliner leads to swelling of the legs and/or potential wounds on your backside. Edema Control - Lymphedema / Segmental Compressive Device / Other: Tubigrip single layer applied. - Used as the sock compression layer under your wraps-purchase new socks for under wraps Patient to wear own Velcro compression garment. Remove compression stockings every night before going to bed and put on every morning when getting up. Elevate legs to the level of the heart and pump ankles as often as possible Compression Pump: Use compression pump on left lower extremity for 60 minutes, twice daily. Compression Pump: Use compression pump on right lower extremity for 60 minutes, twice daily. 1. Would recommend currently that we going discontinue wound care services as the patient appears to be completely healed and overall is doing quite well. He is in agreement with the plan. 2. I am also can recommend that we go ahead and reapply the  [facility] [place] basic compression wraps which do well for him. They are getting somewhat old therefore they are going to see about ordering a new set off of [employer] this will be cheaper than ordered him through the company that we generally ordered for him. They are in agreement with that plan. I printed off information and wrote out what they needed for them as far as size is concerned. 3. I am also can recommend that the patient continue to use his lymphedema pumps daily. He has been doing that and that is great news. He also should continue to elevate is much as possible. We will see the patient back for follow-up visit as needed. Electronic Signature(s) Signed: 01/04/2021 9:36:42 AM By:  Melburn Hake, Aven Cegielski PA-C Entered By: Worthy Keeler on 01/04/2021 09:36:42 Adam Merritt ([id number]) -------------------------------------------------------------------------------- SuperBill Details Patient Name: Adam Merritt Date of Service: 01/04/2021 Medical Record Number: [id number] Patient Account Number: [id number] Date of Birth/Sex: 1946/06/04 (75 y.o. M) Treating RN: Donnamarie Poag Primary Care Provider: [name] Other Clinician: Referring Provider: [name] Treating Provider/Extender: Skipper Cliche in Treatment: 2 Diagnosis Coding ICD-10 Codes Code Description I89.0 Lymphedema, not elsewhere classified I87.2 Venous insufficiency (chronic) (peripheral) Facility Procedures CPT4 Code: [id number] Description: [phone] - WOUND CARE VISIT-LEV 2 EST PT Modifier: Quantity: 1 Physician Procedures CPT4 Code: [id number] Description: 82417 - WC PHYS LEVEL 3 - EST PT Modifier: Quantity: 1 CPT4 Code: Description: ICD-10 Diagnosis Description I89.0 Lymphedema, not elsewhere classified I87.2 Venous insufficiency (chronic) (peripheral) Modifier: Quantity: Electronic Signature(s) Signed: 01/04/2021 9:37:00 AM By: Worthy Keeler PA-C Entered By: Worthy Keeler on 01/04/2021 09:36:59

## 2021-01-06 ENCOUNTER — Ambulatory Visit: Payer: Medicare PPO | Admitting: Physician Assistant

## 2021-01-10 ENCOUNTER — Other Ambulatory Visit: Payer: Self-pay

## 2021-01-10 MED ORDER — BD PEN NEEDLE NANO U/F 32G X 4 MM MISC: 11 refills | Status: DC

## 2021-01-11 ENCOUNTER — Ambulatory Visit: Payer: Medicare PPO

## 2021-02-04 DIAGNOSIS — I509 Heart failure, unspecified: Secondary | ICD-10-CM | POA: Diagnosis not present

## 2021-02-07 ENCOUNTER — Telehealth: Payer: Self-pay

## 2021-02-08 NOTE — Telephone Encounter (Signed)
error 

## 2021-02-09 ENCOUNTER — Encounter: Payer: Self-pay | Admitting: Nurse Practitioner

## 2021-02-09 ENCOUNTER — Other Ambulatory Visit: Payer: Self-pay

## 2021-02-09 ENCOUNTER — Ambulatory Visit (INDEPENDENT_AMBULATORY_CARE_PROVIDER_SITE_OTHER): Payer: Medicare PPO | Admitting: Nurse Practitioner

## 2021-02-09 VITALS — BP 144/68 | HR 76 | Temp 97.1°F | Resp 16 | Ht 66.0 in | Wt 300.0 lb

## 2021-02-09 DIAGNOSIS — Z6841 Body Mass Index (BMI) 40.0 and over, adult: Secondary | ICD-10-CM | POA: Diagnosis not present

## 2021-02-09 DIAGNOSIS — J441 Chronic obstructive pulmonary disease with (acute) exacerbation: Secondary | ICD-10-CM | POA: Insufficient documentation

## 2021-02-09 DIAGNOSIS — R062 Wheezing: Secondary | ICD-10-CM | POA: Diagnosis not present

## 2021-02-09 DIAGNOSIS — I10 Essential (primary) hypertension: Secondary | ICD-10-CM

## 2021-02-09 MED ORDER — BENZONATATE 100 MG PO CAPS
100.0000 mg | ORAL_CAPSULE | Freq: Two times a day (BID) | ORAL | 0 refills | Status: DC | PRN
Start: 2021-02-09 — End: 2022-03-28

## 2021-02-09 MED ORDER — AMOXICILLIN-POT CLAVULANATE 875-125 MG PO TABS: 1.0000 | ORAL_TABLET | Freq: Two times a day (BID) | ORAL | 0 refills | Status: DC

## 2021-02-09 MED ORDER — PREDNISONE 10 MG PO TABS: ORAL_TABLET | ORAL | 0 refills | Status: DC

## 2021-02-09 MED ORDER — ALBUTEROL SULFATE HFA 108 (90 BASE) MCG/ACT IN AERS: 2.0000 | INHALATION_SPRAY | Freq: Four times a day (QID) | RESPIRATORY_TRACT | 5 refills | Status: DC | PRN

## 2021-02-09 NOTE — Progress Notes (Signed)
[facility] [place], [place] [address]  Internal MEDICINE  Office Visit Note  Patient Name: Adam Merritt  [id number]  [id number]  Date of Service: 02/09/2021  Chief Complaint  Patient presents with   Acute Visit   Cough    Coughing up mucus    Sinusitis   Bronchitis   Quality Metric Gaps    Diabetic eye exam      HPI Frank presents for an acute sick visit for symptoms of bronchitis that started over the past weekend. He has a productive cough with grayish-green mucous, wheezing, SOB and chest tightness. He denies any exposure to COVID and has not taken a home COVID test. He has a history of chronic obstructive bronchitis. He denies any fever, fatigue, body aches, chills, nausea, vomiting, diarrhea. He denies any runny nose, sneezing, sinus pain/pressure, sore throat or trouble swallowing. His wife accompanied him to the office visit today and states that this happens to him a couple times per year.    Current Medication:  Outpatient Encounter Medications as of 02/09/2021  Medication Sig   amoxicillin-clavulanate (AUGMENTIN) 875-125 MG tablet Take 1 tablet by mouth 2 (two) times daily.   aspirin 325 MG EC tablet Take 325 mg by mouth daily.   atorvastatin (LIPITOR) 40 MG tablet Take 40 mg by mouth at bedtime.    carvedilol (COREG) 25 MG tablet Take 25 mg by mouth 2 (two) times daily with a meal.   Cholecalciferol (VITAMIN D3 PO) Take by mouth.   cyclobenzaprine (FLEXERIL) 5 MG tablet TAKE 1 TABLET(5 MG) BY MOUTH TWICE DAILY AS NEEDED FOR MUSCLE SPASMS   docusate sodium (COLACE) 50 MG capsule Take 50 mg by mouth 2 (two) times daily.   febuxostat (ULORIC) 40 MG tablet Take 1 tablet (40 mg total) by mouth daily.   fluticasone (FLOVENT HFA) 110 MCG/ACT inhaler Take 2 puffs in am and pm  for copd // chronic bronchitis ( rinse mouth)   furosemide (LASIX) 40 MG tablet TAKE 1 TABLET(40 MG) BY MOUTH DAILY   gabapentin (NEURONTIN) 300 MG capsule Take 2 cap in the  morning, 1 cap in the evening and 2 cap at night.   glucose blood test strip Use as instructed to check blood sugars twice daily   hydrALAZINE (APRESOLINE) 25 MG tablet Take 25 mg by mouth 3 (three) times daily.    Insulin Glargine (BASAGLAR KWIKPEN) 100 UNIT/ML INJECT 30 TO 36 UNITS UNDER THE SKIN EVERY DAY   Insulin Pen Needle (BD PEN NEEDLE NANO U/F) 32G X 4 MM MISC Use as directed with insulin DX e11.65   ipratropium-albuterol (DUONEB) 0.5-2.5 (3) MG/3ML SOLN Take 3 mLs by nebulization every 4 (four) hours as needed (for shortness of breath).   liraglutide (VICTOZA) 18 MG/3ML SOPN INJECT 1.8MG  INTO THE SKIN DAILY   Multiple Vitamin (MULTIVITAMIN WITH MINERALS) TABS tablet Take 1 tablet by mouth daily.   ondansetron (ZOFRAN) 4 MG tablet Take 1 tablet (4 mg total) by mouth every 8 (eight) hours as needed.   OneTouch Delica Lancets 19J MISC Use twice daily diag e11.65   pentoxifylline (TRENTAL) 400 MG CR tablet TAKE 1 TABLET BY MOUTH THREE TIMES DAILY FOR CLAUDICATION   potassium chloride SA (KLOR-CON) 20 MEQ tablet Take 1 tablet (20 mEq total) by mouth 2 (two) times daily.   predniSONE (DELTASONE) 10 MG tablet Take one tab 3 x day for 3 days, then take one tab 2 x a day for 3 days and then take  one tab a day for 3 days for copd   sennosides-docusate sodium (SENOKOT-S) 8.6-50 MG tablet Take 1-2 tablets by mouth 2 (two) times daily.   sertraline (ZOLOFT) 100 MG tablet Take 100 mg by mouth daily.    spironolactone (ALDACTONE) 25 MG tablet Take 0.5 tablets by mouth daily.   spironolactone (ALDACTONE) 25 MG tablet Take 0.5 tablets by mouth daily.   tiZANidine (ZANAFLEX) 2 MG tablet Take 1 tablet (2 mg total) by mouth 3 (three) times daily.   traZODone (DESYREL) 50 MG tablet Take 50 mg by mouth at bedtime.   triamcinolone cream (KENALOG) 0.1 % Apply 1 application topically 2 (two) times daily.   [DISCONTINUED] albuterol (VENTOLIN HFA) 108 (90 Base) MCG/ACT inhaler Inhale 2 puffs into the lungs every  6 (six) hours as needed for wheezing or shortness of breath.   [DISCONTINUED] azithromycin (ZITHROMAX) 250 MG tablet Take one tab po qd for 14 days   [DISCONTINUED] benzonatate (TESSALON) 100 MG capsule Take 1 capsule (100 mg total) by mouth 2 (two) times daily as needed for cough.   albuterol (VENTOLIN HFA) 108 (90 Base) MCG/ACT inhaler Inhale 2 puffs into the lungs every 6 (six) hours as needed for wheezing or shortness of breath.   benzonatate (TESSALON) 100 MG capsule Take 1 capsule (100 mg total) by mouth 2 (two) times daily as needed for cough.   No facility-administered encounter medications on file as of 02/09/2021.      Medical History: Past Medical History:  Diagnosis Date   Anemia    CAD (coronary artery disease)    Chest pain    Coronary artery disease    Diabetes mellitus without complication (HCC)    Diastolic CHF ([place])    Esophageal reflux    Herpes zoster without mention of complication    Hyperlipidemia    Hypertension    Insomnia    Lumbago    Lymphedema    Neuropathy in diabetes (HCC)    Obstructive chronic bronchitis without exacerbation (HCC)    Other malaise and fatigue    Stroke ([place])    Varicose veins      Vital Signs: BP (!) 144/68   Pulse 76   Temp (!) 97.1 F (36.2 C)   Resp 16   Ht 5\' 6"  (1.676 m)   Wt 300 lb (136.1 kg)   SpO2 97%   BMI 48.42 kg/m    Review of Systems  Constitutional:  Negative for chills, fatigue and fever.  HENT:  Positive for congestion and postnasal drip. Negative for ear discharge, ear pain, rhinorrhea, sinus pressure, sinus pain, sneezing and sore throat.   Respiratory:  Positive for cough, chest tightness, shortness of breath and wheezing.   Cardiovascular:  Negative for chest pain and palpitations.  Gastrointestinal:  Negative for abdominal pain, constipation, diarrhea, nausea and vomiting.  Musculoskeletal: Negative.  Negative for myalgias.  Skin: Negative.  Negative for rash.  Neurological:  Negative for  dizziness, light-headedness and headaches.   Physical Exam Cardiovascular:     Rate and Rhythm: Normal rate and regular rhythm.     Pulses: Normal pulses.     Heart sounds: Normal heart sounds. No murmur heard. Pulmonary:     Effort: Accessory muscle usage present. No respiratory distress.     Breath sounds: No decreased air movement. Examination of the right-upper field reveals wheezing. Examination of the left-upper field reveals wheezing. Examination of the right-middle field reveals wheezing. Examination of the left-middle field reveals wheezing. Examination of the right-lower  field reveals decreased breath sounds and wheezing. Examination of the left-lower field reveals decreased breath sounds and wheezing. Decreased breath sounds and wheezing present. No rhonchi or rales.      Assessment/Plan: 1. Obstructive chronic bronchitis with exacerbation (HCC) Empiric antibiotic treatment of bronchitis exacerbation, rescue inhaler refill ordered and benzonatate for cough - benzonatate (TESSALON) 100 MG capsule; Take 1 capsule (100 mg total) by mouth 2 (two) times daily as needed for cough.  Dispense: 20 capsule; Refill: 0 - albuterol (VENTOLIN HFA) 108 (90 Base) MCG/ACT inhaler; Inhale 2 puffs into the lungs every 6 (six) hours as needed for wheezing or shortness of breath.  Dispense: 18 g; Refill: 5 - amoxicillin-clavulanate (AUGMENTIN) 875-125 MG tablet; Take 1 tablet by mouth 2 (two) times daily.  Dispense: 10 tablet; Refill: 0  2. Bilateral wheezing Prednisone course prescribed for moderate to severe wheezing - predniSONE (DELTASONE) 10 MG tablet; Take one tab 3 x day for 3 days, then take one tab 2 x a day for 3 days and then take one tab a day for 3 days for copd  Dispense: 18 tablet; Refill: 0   3. Essential hypertension Blood pressure is elevated but patient is acutely ill, will recheck blood pressure at routien follow up in October.   General Counseling: Aras verbalizes  understanding of the findings of todays visit and agrees with plan of treatment. I have discussed any further diagnostic evaluation that may be needed or ordered today. We also reviewed his medications today. he has been encouraged to call the office with any questions or concerns that should arise related to todays visit.    Counseling:    No orders of the defined types were placed in this encounter.   Meds ordered this encounter  Medications   benzonatate (TESSALON) 100 MG capsule    Sig: Take 1 capsule (100 mg total) by mouth 2 (two) times daily as needed for cough.    Dispense:  20 capsule    Refill:  0   albuterol (VENTOLIN HFA) 108 (90 Base) MCG/ACT inhaler    Sig: Inhale 2 puffs into the lungs every 6 (six) hours as needed for wheezing or shortness of breath.    Dispense:  18 g    Refill:  5   predniSONE (DELTASONE) 10 MG tablet    Sig: Take one tab 3 x day for 3 days, then take one tab 2 x a day for 3 days and then take one tab a day for 3 days for copd    Dispense:  18 tablet    Refill:  0   amoxicillin-clavulanate (AUGMENTIN) 875-125 MG tablet    Sig: Take 1 tablet by mouth 2 (two) times daily.    Dispense:  10 tablet    Refill:  0    Return if symptoms worsen or fail to improve. Patient scheduled for routine follow up in October.   [place] Controlled Substance Database was reviewed by me for overdose risk score (ORS)  Time spent:20 Minutes Time spent with patient included reviewing progress notes, labs, imaging studies, and discussing plan for follow up.   This patient was seen by Jonetta Osgood, FNP-C in collaboration with Dr. [name] as a part of collaborative care agreement.  Aubriee Szeto R. Valetta Fuller, MSN, FNP-C Internal Medicine

## 2021-02-24 ENCOUNTER — Emergency Department: Payer: Medicare PPO

## 2021-02-24 ENCOUNTER — Emergency Department
Admission: EM | Admit: 2021-02-24 | Discharge: 2021-02-24 | Disposition: A | Discharge status: Home / Self Care | Payer: Medicare PPO | Attending: Emergency Medicine | Admitting: Emergency Medicine

## 2021-02-24 ENCOUNTER — Other Ambulatory Visit: Payer: Self-pay

## 2021-02-24 DIAGNOSIS — N183 Chronic kidney disease, stage 3 unspecified: Secondary | ICD-10-CM | POA: Insufficient documentation

## 2021-02-24 DIAGNOSIS — W19XXXA Unspecified fall, initial encounter: Secondary | ICD-10-CM

## 2021-02-24 DIAGNOSIS — E1122 Type 2 diabetes mellitus with diabetic chronic kidney disease: Secondary | ICD-10-CM | POA: Diagnosis not present

## 2021-02-24 DIAGNOSIS — Z7982 Long term (current) use of aspirin: Secondary | ICD-10-CM | POA: Diagnosis not present

## 2021-02-24 DIAGNOSIS — D631 Anemia in chronic kidney disease: Secondary | ICD-10-CM | POA: Diagnosis not present

## 2021-02-24 DIAGNOSIS — W01198A Fall on same level from slipping, tripping and stumbling with subsequent striking against other object, initial encounter: Secondary | ICD-10-CM | POA: Diagnosis not present

## 2021-02-24 DIAGNOSIS — I2510 Atherosclerotic heart disease of native coronary artery without angina pectoris: Secondary | ICD-10-CM | POA: Insufficient documentation

## 2021-02-24 DIAGNOSIS — Z79899 Other long term (current) drug therapy: Secondary | ICD-10-CM | POA: Diagnosis not present

## 2021-02-24 DIAGNOSIS — Z89421 Acquired absence of other right toe(s): Secondary | ICD-10-CM | POA: Diagnosis not present

## 2021-02-24 DIAGNOSIS — I130 Hypertensive heart and chronic kidney disease with heart failure and stage 1 through stage 4 chronic kidney disease, or unspecified chronic kidney disease: Secondary | ICD-10-CM | POA: Insufficient documentation

## 2021-02-24 DIAGNOSIS — Z20822 Contact with and (suspected) exposure to covid-19: Secondary | ICD-10-CM | POA: Diagnosis not present

## 2021-02-24 DIAGNOSIS — I5032 Chronic diastolic (congestive) heart failure: Secondary | ICD-10-CM | POA: Insufficient documentation

## 2021-02-24 DIAGNOSIS — S0990XA Unspecified injury of head, initial encounter: Secondary | ICD-10-CM | POA: Diagnosis not present

## 2021-02-24 DIAGNOSIS — R531 Weakness: Secondary | ICD-10-CM | POA: Diagnosis not present

## 2021-02-24 DIAGNOSIS — G319 Degenerative disease of nervous system, unspecified: Secondary | ICD-10-CM | POA: Diagnosis not present

## 2021-02-24 DIAGNOSIS — Z951 Presence of aortocoronary bypass graft: Secondary | ICD-10-CM | POA: Diagnosis not present

## 2021-02-24 DIAGNOSIS — J441 Chronic obstructive pulmonary disease with (acute) exacerbation: Secondary | ICD-10-CM | POA: Diagnosis not present

## 2021-02-24 DIAGNOSIS — Z794 Long term (current) use of insulin: Secondary | ICD-10-CM | POA: Diagnosis not present

## 2021-02-24 DIAGNOSIS — R296 Repeated falls: Secondary | ICD-10-CM | POA: Diagnosis not present

## 2021-02-24 LAB — CBC
HCT: 38.8 % — ABNORMAL LOW (ref 39.0–52.0)
Hemoglobin: 13.1 g/dL (ref 13.0–17.0)
MCH: 31.3 pg (ref 26.0–34.0)
MCHC: 33.8 g/dL (ref 30.0–36.0)
MCV: 92.8 fL (ref 80.0–100.0)
Platelets: 123 10*3/uL — ABNORMAL LOW (ref 150–400)
RBC: 4.18 MIL/uL — ABNORMAL LOW (ref 4.22–5.81)
RDW: 13 % (ref 11.5–15.5)
WBC: 14.2 10*3/uL — ABNORMAL HIGH (ref 4.0–10.5)
nRBC: 0 % (ref 0.0–0.2)

## 2021-02-24 LAB — URINALYSIS, COMPLETE (UACMP) WITH MICROSCOPIC
Bacteria, UA: NONE SEEN
Bilirubin Urine: NEGATIVE
Glucose, UA: NEGATIVE mg/dL
Hgb urine dipstick: NEGATIVE
Ketones, ur: NEGATIVE mg/dL
Nitrite: NEGATIVE
Protein, ur: NEGATIVE mg/dL
Specific Gravity, Urine: 1.011 (ref 1.005–1.030)
pH: 5 (ref 5.0–8.0)

## 2021-02-24 LAB — BASIC METABOLIC PANEL
Anion gap: 11 (ref 5–15)
BUN: 24 mg/dL — ABNORMAL HIGH (ref 8–23)
CO2: 22 mmol/L (ref 22–32)
Calcium: 9.1 mg/dL (ref 8.9–10.3)
Chloride: 101 mmol/L (ref 98–111)
Creatinine, Ser: 1.87 mg/dL — ABNORMAL HIGH (ref 0.61–1.24)
GFR, Estimated: 37 mL/min — ABNORMAL LOW (ref >60)
Glucose, Bld: 153 mg/dL — ABNORMAL HIGH (ref 70–99)
Potassium: 4.5 mmol/L (ref 3.5–5.1)
Sodium: 134 mmol/L — ABNORMAL LOW (ref 135–145)

## 2021-02-24 LAB — RESP PANEL BY RT-PCR (FLU A&B, COVID) ARPGX2
Influenza A by PCR: NEGATIVE
Influenza B by PCR: NEGATIVE
SARS Coronavirus 2 by RT PCR: NEGATIVE

## 2021-02-24 LAB — TROPONIN I (HIGH SENSITIVITY): Troponin I (High Sensitivity): 22 ng/L — ABNORMAL HIGH (ref <18)

## 2021-02-24 NOTE — ED Notes (Signed)
Pt attempting to give urine sample

## 2021-02-24 NOTE — Discharge Instructions (Addendum)
Please follow-up with your doctor.  Return to the emergency department for any further falls, any weakness, or any other symptom personally concerning to yourself.

## 2021-02-24 NOTE — ED Provider Notes (Signed)
[facility] Emergency Department Provider Note  Time seen: 10:31 AM  I have reviewed the triage vital signs and the nursing notes.   HISTORY  Chief Complaint Fall and Weakness   HPI Adam Merritt is a 75 y.o. male with a past medical history of anemia, CAD, diabetes, CHF, hypertension, hyperlipidemia, prior CVA, lymphedema, presents to the emergency department after 2 falls last night.  Patient states he has a history of falls, states he has not had a fall approximately a month or so however last night states he attempted to get out of his recliner and went too far forwards falling to the ground.  Patient states he then sat back in the recliner and when he attempted to get up again his foot slipped and he fell backwards hitting his head.  Denies LOC.  Does state 5 to 10 minutes of nausea that then resolved.  Denies any vomiting or diarrhea.  Denies any recent cough congestion fever.   Past Medical History:  Diagnosis Date   Anemia    CAD (coronary artery disease)    Chest pain    Coronary artery disease    Diabetes mellitus without complication (HCC)    Diastolic CHF ([place])    Esophageal reflux    Herpes zoster without mention of complication    Hyperlipidemia    Hypertension    Insomnia    Lumbago    Lymphedema    Neuropathy in diabetes ([place])    Obstructive chronic bronchitis without exacerbation ([place])    Other malaise and fatigue    Stroke ([place])    Varicose veins     Patient Active Problem List   Diagnosis Date Noted   Obstructive chronic bronchitis with exacerbation ([place]) 02/09/2021   PTSD (post-traumatic stress disorder) 12/08/2019   Type 2 diabetes mellitus with hyperglycemia ([place]) 03/19/2019   Aplastic anemia, unspecified ([place]) 03/19/2019   Acute pain of right knee 02/23/2019   Uncontrolled type 2 diabetes mellitus with hyperglycemia ([place]) 09/17/2018   Primary insomnia 08/07/2018   Bacteremia due to group B Streptococcus 07/02/2018   COPD  (chronic obstructive pulmonary disease) ([place]) 05/13/2018   Dysuria 05/05/2018   Encounter for general adult medical examination with abnormal findings 05/05/2018   CHF exacerbation ([place]) 04/16/2018   Achilles tendon disorder, right 04/02/2018   Essential hypertension 01/28/2018   Lymphedema of both lower extremities 01/28/2018   Cellulitis and abscess of lower extremity 09/20/2017   Edema of both lower legs due to peripheral venous insufficiency 09/20/2017   Cellulitis 09/20/2017   Atopic dermatitis 09/02/2017   Chronic pain disorder 09/02/2017   CVA (cerebral vascular accident) ([place]) 09/15/2016   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   CVA (cerebral infarction) 02/12/2015   Chronic diastolic CHF (congestive heart failure), NYHA class 3 ([place]) 11/30/2014   Benign essential hypertension 11/06/2014   CAD (coronary artery disease) of artery bypass graft 01/19/2014   CKD (chronic kidney disease), stage III ([place]) 01/19/2014   Acute bronchitis with COPD ([place]) 01/19/2014   Diabetic neuropathy ([place]) 01/19/2014   Insulin dependent diabetes mellitus 01/19/2014   OSA (obstructive sleep apnea) 01/19/2014   Mixed hyperlipidemia 10/29/2013   Peripheral vascular disease, unspecified ([place]) 09/29/2013    Past Surgical History:  Procedure Laterality Date   AMPUTATION TOE Right 06/23/2018   Procedure: AMPUTATION TOE;  Surgeon: Sharlotte Alamo, DPM;  Location: [facility] ORS;  Service: Podiatry;  Laterality: Right;   CHOLECYSTECTOMY     CORONARY ARTERY BYPASS GRAFT      Prior  to Admission medications   Medication Sig Start Date End Date Taking? Authorizing Provider  albuterol (VENTOLIN HFA) 108 (90 Base) MCG/ACT inhaler Inhale 2 puffs into the lungs every 6 (six) hours as needed for wheezing or shortness of breath. 02/09/21   Jonetta Osgood, NP  amoxicillin-clavulanate (AUGMENTIN) 875-125 MG tablet Take 1 tablet by mouth 2 (two) times daily. 02/09/21   Jonetta Osgood, NP  aspirin 325 MG EC tablet Take 325  mg by mouth daily.    [provider]  atorvastatin (LIPITOR) 40 MG tablet Take 40 mg by mouth at bedtime.     [provider]  benzonatate (TESSALON) 100 MG capsule Take 1 capsule (100 mg total) by mouth 2 (two) times daily as needed for cough. 02/09/21   Jonetta Osgood, NP  carvedilol (COREG) 25 MG tablet Take 25 mg by mouth 2 (two) times daily with a meal.    [provider]  Cholecalciferol (VITAMIN D3 PO) Take by mouth.    [provider]  cyclobenzaprine (FLEXERIL) 5 MG tablet TAKE 1 TABLET(5 MG) BY MOUTH TWICE DAILY AS NEEDED FOR MUSCLE SPASMS 08/26/20   Luiz Ochoa, NP  docusate sodium (COLACE) 50 MG capsule Take 50 mg by mouth 2 (two) times daily.    [provider]  febuxostat (ULORIC) 40 MG tablet Take 1 tablet (40 mg total) by mouth daily. 03/17/19   Ronnell Freshwater, NP  fluticasone (FLOVENT HFA) 110 MCG/ACT inhaler Take 2 puffs in am and pm  for copd // chronic bronchitis ( rinse mouth) 03/18/20   [name], MD  furosemide (LASIX) 40 MG tablet TAKE 1 TABLET(40 MG) BY MOUTH DAILY 11/22/20   [name], MD  gabapentin (NEURONTIN) 300 MG capsule Take 2 cap in the morning, 1 cap in the evening and 2 cap at night. 11/08/20   [name], MD  glucose blood test strip Use as instructed to check blood sugars twice daily 02/23/20   [name], MD  hydrALAZINE (APRESOLINE) 25 MG tablet Take 25 mg by mouth 3 (three) times daily.  09/13/18   [provider]  Insulin Glargine (BASAGLAR KWIKPEN) 100 UNIT/ML INJECT 30 TO 36 UNITS UNDER THE SKIN EVERY DAY 12/08/20   [name], MD  Insulin Pen Needle (BD PEN NEEDLE NANO U/F) 32G X 4 MM MISC Use as directed with insulin DX e11.65 01/10/21   [name], MD  ipratropium-albuterol (DUONEB) 0.5-2.5 (3) MG/3ML SOLN Take 3 mLs by nebulization every 4 (four) hours as needed (for shortness of breath). 09/10/17   [name], MD  liraglutide [name]) 18 MG/3ML SOPN INJECT 1.8MG  INTO THE SKIN  DAILY 09/22/19   Ronnell Freshwater, NP  Multiple Vitamin (MULTIVITAMIN WITH MINERALS) TABS tablet Take 1 tablet by mouth daily.    [provider]  ondansetron (ZOFRAN) 4 MG tablet Take 1 tablet (4 mg total) by mouth every 8 (eight) hours as needed. 02/15/19   Nance Pear, MD  OneTouch Delica Lancets 10G MISC Use twice daily diag e11.65 02/23/20   [name], MD  pentoxifylline (TRENTAL) 400 MG CR tablet TAKE 1 TABLET BY MOUTH THREE TIMES DAILY FOR CLAUDICATION 09/27/20   [name], MD  potassium chloride SA (KLOR-CON) 20 MEQ tablet Take 1 tablet (20 mEq total) by mouth 2 (two) times daily. 12/15/20   [name], MD  predniSONE (DELTASONE) 10 MG tablet Take one tab 3 x day for 3 days, then take one tab  2 x a day for 3 days and then take one tab a day for 3 days for copd 02/09/21   Jonetta Osgood, NP  sennosides-docusate sodium (SENOKOT-S) 8.6-50 MG tablet Take 1-2 tablets by mouth 2 (two) times daily.    [provider]  sertraline (ZOLOFT) 100 MG tablet Take 100 mg by mouth daily.  10/17/18   [provider]  spironolactone (ALDACTONE) 25 MG tablet Take 0.5 tablets by mouth daily. 10/21/20   [provider]  spironolactone (ALDACTONE) 25 MG tablet Take 0.5 tablets by mouth daily. 10/21/20 10/22/21  [provider]  tiZANidine (ZANAFLEX) 2 MG tablet Take 1 tablet (2 mg total) by mouth 3 (three) times daily. 06/02/20   Luiz Ochoa, NP  traZODone (DESYREL) 50 MG tablet Take 50 mg by mouth at bedtime.    [provider]  triamcinolone cream (KENALOG) 0.1 % Apply 1 application topically 2 (two) times daily. 12/02/20   Jonetta Osgood, NP    No Known Allergies  Family History  Problem Relation Age of Onset   Diabetes Mother    Hyperlipidemia Mother    Hypertension Mother    Diabetes Father    Hyperlipidemia Father    Hypertension Father    CAD Father     Social History Social History   Tobacco Use   Smoking status: Never    Smokeless tobacco: Never  Vaping Use   Vaping Use: Never used  Substance Use Topics   Alcohol use: No    Comment: quit alcohol 30 years ago   Drug use: No    Review of Systems Constitutional: Negative for fever. Cardiovascular: Negative for chest pain. Respiratory: Negative for shortness of breath. Gastrointestinal: Negative for abdominal pain, vomiting  Musculoskeletal: Negative for musculoskeletal complaints Neurological: Negative for headache.  Denies any new weakness but states a history of lower extremity weakness bilaterally. All other ROS negative  ____________________________________________   PHYSICAL EXAM:  VITAL SIGNS: ED Triage Vitals  Enc Vitals Group     BP 02/24/21 0923 129/65     Pulse Rate 02/24/21 0923 78     Resp 02/24/21 0923 16     Temp 02/24/21 0923 97.6 F (36.4 C)     Temp Source 02/24/21 0923 Oral     SpO2 02/24/21 0923 97 %     Weight 02/24/21 1013 299 lb 13.2 oz (136 kg)     Height 02/24/21 1013 5\' 6"  (1.676 m)     Head Circumference --      Peak Flow --      Pain Score --      Pain Loc --      Pain Edu? --      Excl. in [place]? --     Constitutional: Alert and oriented. Well appearing and in no distress. Eyes: Normal exam ENT      Head: Normocephalic and atraumatic.      Mouth/Throat: Mucous membranes are moist. Cardiovascular: Normal rate, regular rhythm. Respiratory: Normal respiratory effort without tachypnea nor retractions. Breath sounds are clear  Gastrointestinal: Soft and nontender. No distention.   Musculoskeletal: Significant lymphedema to lower extremities bilaterally, wife states this is chronic. Neurologic:  Normal speech and language. No gross focal neurologic deficits  Skin:  Skin is warm, dry and intact.  Psychiatric: Mood and affect are normal.   ____________________________________________    EKG  EKG viewed and interpreted by myself shows normal sinus rhythm 80 bpm with a narrow QRS, normal axis, normal  intervals, nonspecific but  no concerning ST changes.  ____________________________________________    RADIOLOGY  CT scan head shows no acute abnormality.  ____________________________________________   INITIAL IMPRESSION / ASSESSMENT AND PLAN / ED COURSE  Pertinent labs & imaging results that were available during my care of the patient were reviewed by me and considered in my medical decision making (see chart for details).   Patient presents to the emergency department for 2 falls last night.  Patient states he has a history of falls, wife also states patient falls frequently but has not fallen in approximately 1 month.  Both falls last night patient states were mechanical, denies any lightheadedness dizziness or syncope.  Given the patient's 2 falls last night including head injury will obtain CT imaging of the head, lab work and urinalysis.  We will continue to closely monitor the patient.  Patient is somewhat fatigued appearing on my examination but is able to answer all questions appropriately able to wake up sufficiently during examination.  States he is just tired because he did not sleep last night.  Patient's lab work is largely [place].  Slight leukocytosis otherwise labs are largely at baseline.  COVID is negative.  CT scan the head is negative.  Urinalysis is finally resulted showing no significant findings.  Patient is requesting discharge home, asking how much longer will be.  As the patient feels well enough to go home with reassuring ER work-up I believe that is a reasonable plan of care.  I did discuss return precautions if he has any more episodes of weakness or falls he is to return to the emergency department otherwise they will follow-up with her doctor.  Patient and wife agreeable to plan of care.  DOLPHUS LINCH was evaluated in Emergency Department on 02/24/2021 for the symptoms described in the history of present illness. He was evaluated in the context of the global  COVID-19 pandemic, which necessitated consideration that the patient might be at risk for infection with the SARS-CoV-2 virus that causes COVID-19. Institutional protocols and algorithms that pertain to the evaluation of patients at risk for COVID-19 are in a state of rapid change based on information released by regulatory bodies including the CDC and federal and state organizations. These policies and algorithms were followed during the patient's care in the ED.  ____________________________________________   FINAL CLINICAL IMPRESSION(S) / ED DIAGNOSES  Falls   Harvest Dark, MD 02/24/21 1327

## 2021-02-24 NOTE — ED Triage Notes (Signed)
Pt comes into the ED via EMS from home with c/o increased weakness over the past 3 days, fell twice last night last was around 230a and was not able to get back up Pt is a/ox3 on arrival, right sided deficits from previous stroke Did hit his head, not on blood thinners [id number] 142/70 80HR 99%RA

## 2021-02-28 ENCOUNTER — Ambulatory Visit (INDEPENDENT_AMBULATORY_CARE_PROVIDER_SITE_OTHER): Payer: Medicare PPO | Admitting: Nurse Practitioner

## 2021-02-28 ENCOUNTER — Other Ambulatory Visit: Payer: Self-pay

## 2021-02-28 ENCOUNTER — Encounter: Payer: Self-pay | Admitting: Nurse Practitioner

## 2021-02-28 VITALS — BP 130/86 | HR 65 | Temp 98.3°F | Resp 16 | Ht 66.0 in | Wt 300.0 lb

## 2021-02-28 DIAGNOSIS — E1165 Type 2 diabetes mellitus with hyperglycemia: Secondary | ICD-10-CM

## 2021-02-28 DIAGNOSIS — D229 Melanocytic nevi, unspecified: Secondary | ICD-10-CM

## 2021-02-28 DIAGNOSIS — R269 Unspecified abnormalities of gait and mobility: Secondary | ICD-10-CM

## 2021-02-28 DIAGNOSIS — I10 Essential (primary) hypertension: Secondary | ICD-10-CM

## 2021-02-28 DIAGNOSIS — R296 Repeated falls: Secondary | ICD-10-CM | POA: Diagnosis not present

## 2021-02-28 LAB — POCT GLYCOSYLATED HEMOGLOBIN (HGB A1C): Hemoglobin A1C: 6.4 % — AB (ref 4.0–5.6)

## 2021-02-28 MED ORDER — FREESTYLE LIBRE READER DEVI: 1.0000 | Freq: Once | 0 refills | Status: AC

## 2021-02-28 MED ORDER — FREESTYLE LIBRE 2 SENSOR MISC: 1.0000 | 3 refills | Status: DC

## 2021-02-28 NOTE — Progress Notes (Signed)
[facility] [place], [place] [address]  Internal MEDICINE  Office Visit Note  Patient Name: Adam Merritt  [id number]  [id number]  Date of Service: 02/28/2021  Chief Complaint  Patient presents with   Follow-up    Mole on neck is getting bigger, weakness   Gastroesophageal Reflux   Hyperlipidemia   Hypertension   COPD   Anemia    HPI Jesiah presents for a follow up visit after going to the ER. He fell twice at home and stayed in the floor all night because his wife was unable to lift him. The CT head was negative for acute intracranial abnormalities.  He has a mole/skin tag behind the left ear that has been getting larger and he would like to have it removed.  -He is also asking about getting the freestyle libre glucose sensor.   Current Medication: Outpatient Encounter Medications as of 02/28/2021  Medication Sig   albuterol (VENTOLIN HFA) 108 (90 Base) MCG/ACT inhaler Inhale 2 puffs into the lungs every 6 (six) hours as needed for wheezing or shortness of breath.   aspirin 325 MG EC tablet Take 325 mg by mouth daily.   atorvastatin (LIPITOR) 40 MG tablet Take 40 mg by mouth at bedtime.    benzonatate (TESSALON) 100 MG capsule Take 1 capsule (100 mg total) by mouth 2 (two) times daily as needed for cough.   carvedilol (COREG) 25 MG tablet Take 25 mg by mouth 2 (two) times daily with a meal.   Cholecalciferol (VITAMIN D3 PO) Take by mouth.   [EXPIRED] Continuous Blood Gluc Receiver (FREESTYLE LIBRE READER) DEVI 1 Device by Does not apply route once for 1 dose.   docusate sodium (COLACE) 50 MG capsule Take 50 mg by mouth 2 (two) times daily.   febuxostat (ULORIC) 40 MG tablet Take 1 tablet (40 mg total) by mouth daily.   fluticasone (FLOVENT HFA) 110 MCG/ACT inhaler Take 2 puffs in am and pm  for copd // chronic bronchitis ( rinse mouth)   furosemide (LASIX) 40 MG tablet TAKE 1 TABLET(40 MG) BY MOUTH DAILY   gabapentin (NEURONTIN) 300 MG capsule Take 2 cap  in the morning, 1 cap in the evening and 2 cap at night.   glucose blood test strip Use as instructed to check blood sugars twice daily   hydrALAZINE (APRESOLINE) 25 MG tablet Take 25 mg by mouth 3 (three) times daily.    Insulin Glargine (BASAGLAR KWIKPEN) 100 UNIT/ML INJECT 30 TO 36 UNITS UNDER THE SKIN EVERY DAY   Insulin Pen Needle (BD PEN NEEDLE NANO U/F) 32G X 4 MM MISC Use as directed with insulin DX e11.65   ipratropium-albuterol (DUONEB) 0.5-2.5 (3) MG/3ML SOLN Take 3 mLs by nebulization every 4 (four) hours as needed (for shortness of breath).   liraglutide (VICTOZA) 18 MG/3ML SOPN INJECT 1.8MG  INTO THE SKIN DAILY   Multiple Vitamin (MULTIVITAMIN WITH MINERALS) TABS tablet Take 1 tablet by mouth daily.   ondansetron (ZOFRAN) 4 MG tablet Take 1 tablet (4 mg total) by mouth every 8 (eight) hours as needed.   OneTouch Delica Lancets 00T MISC Use twice daily diag e11.65   pentoxifylline (TRENTAL) 400 MG CR tablet TAKE 1 TABLET BY MOUTH THREE TIMES DAILY FOR CLAUDICATION   potassium chloride SA (KLOR-CON) 20 MEQ tablet Take 1 tablet (20 mEq total) by mouth 2 (two) times daily.   prazosin (MINIPRESS) 1 MG capsule TAKE THREE CAPSULES BY MOUTH EVERY EVENING FOR NIGHTMARES   sennosides-docusate sodium (  SENOKOT-S) 8.6-50 MG tablet Take 1-2 tablets by mouth 2 (two) times daily.   sertraline (ZOLOFT) 100 MG tablet Take 100 mg by mouth daily.    tiZANidine (ZANAFLEX) 2 MG tablet Take 1 tablet (2 mg total) by mouth 3 (three) times daily.   traZODone (DESYREL) 50 MG tablet Take 75 mg by mouth at bedtime.   triamcinolone cream (KENALOG) 0.1 % Apply 1 application topically 2 (two) times daily.   [DISCONTINUED] Continuous Blood Gluc Sensor (FREESTYLE LIBRE 2 SENSOR) MISC 1 Device by Does not apply route every 14 (fourteen) days.   [DISCONTINUED] senna (SENOKOT) 8.6 MG TABS tablet Take 1 tablet by mouth.   [DISCONTINUED] amoxicillin-clavulanate (AUGMENTIN) 875-125 MG tablet Take 1 tablet by mouth 2 (two)  times daily. (Patient not taking: Reported on 02/28/2021)   [DISCONTINUED] cyclobenzaprine (FLEXERIL) 5 MG tablet TAKE 1 TABLET(5 MG) BY MOUTH TWICE DAILY AS NEEDED FOR MUSCLE SPASMS (Patient not taking: Reported on 02/28/2021)   [DISCONTINUED] predniSONE (DELTASONE) 10 MG tablet Take one tab 3 x day for 3 days, then take one tab 2 x a day for 3 days and then take one tab a day for 3 days for copd (Patient not taking: Reported on 02/28/2021)   [DISCONTINUED] spironolactone (ALDACTONE) 25 MG tablet Take 0.5 tablets by mouth daily. (Patient not taking: Reported on 02/28/2021)   [DISCONTINUED] spironolactone (ALDACTONE) 25 MG tablet Take 0.5 tablets by mouth daily. (Patient not taking: Reported on 02/28/2021)   No facility-administered encounter medications on file as of 02/28/2021.    Surgical History: Past Surgical History:  Procedure Laterality Date   AMPUTATION TOE Right 06/23/2018   Procedure: AMPUTATION TOE;  Surgeon: Sharlotte Alamo, DPM;  Location: [facility] ORS;  Service: Podiatry;  Laterality: Right;   CHOLECYSTECTOMY     CORONARY ARTERY BYPASS GRAFT      Medical History: Past Medical History:  Diagnosis Date   Anemia    CAD (coronary artery disease)    Chest pain    Coronary artery disease    Diabetes mellitus without complication (HCC)    Diastolic CHF ([place])    Esophageal reflux    Herpes zoster without mention of complication    Hyperlipidemia    Hypertension    Insomnia    Lumbago    Lymphedema    Neuropathy in diabetes (HCC)    Obstructive chronic bronchitis without exacerbation (HCC)    Other malaise and fatigue    Stroke ([place])    Varicose veins     Family History: Family History  Problem Relation Age of Onset   Diabetes Mother    Hyperlipidemia Mother    Hypertension Mother    Diabetes Father    Hyperlipidemia Father    Hypertension Father    CAD Father     Social History   Socioeconomic History   Marital status: Married    Spouse name: Not on file   Number of  children: Not on file   Years of education: Not on file   Highest education level: Not on file  Occupational History   Occupation: retired  Tobacco Use   Smoking status: Never   Smokeless tobacco: Never  Vaping Use   Vaping Use: Never used  Substance and Sexual Activity   Alcohol use: No    Comment: quit alcohol 30 years ago   Drug use: No   Sexual activity: Not Currently  Other Topics Concern   Not on file  Social History Narrative   Lives at home with wife, uses  a wheelchair now   Social Determinants of Health   Financial Resource Strain: Not on file  Food Insecurity: Not on file  Transportation Needs: Not on file  Physical Activity: Not on file  Stress: Not on file  Social Connections: Not on file  Intimate Partner Violence: Not on file      Review of Systems  Constitutional:  Positive for fatigue.  HENT: Negative.    Eyes: Negative.   Respiratory:  Positive for cough and shortness of breath. Negative for chest tightness and wheezing.   Cardiovascular: Negative.  Negative for chest pain.  Gastrointestinal:  Positive for constipation. Negative for abdominal pain, diarrhea, nausea and vomiting.  Genitourinary: Negative.   Musculoskeletal:  Positive for arthralgias and gait problem (uses walker). Negative for joint swelling and myalgias.  Skin:  Positive for rash.  Neurological:  Negative for dizziness, light-headedness and headaches.  Hematological:  Positive for adenopathy (lymphedema).  Psychiatric/Behavioral:  Negative for behavioral problems, self-injury and suicidal ideas. The patient is not nervous/anxious.    Vital Signs: BP 130/86   Pulse 65   Temp 98.3 F (36.8 C)   Resp 16   Ht 5\' 6"  (1.676 m)   Wt 300 lb (136.1 kg)   SpO2 97%   BMI 48.42 kg/m    Physical Exam Vitals reviewed.  Constitutional:      General: He is not in acute distress.    Appearance: Normal appearance. He is obese. He is not ill-appearing.  HENT:     Head: Normocephalic and  atraumatic.  Cardiovascular:     Rate and Rhythm: Normal rate and regular rhythm.     Pulses: Normal pulses.     Heart sounds: Normal heart sounds.  Musculoskeletal:     Right lower leg: Edema present.     Left lower leg: Edema present.  Skin:    General: Skin is warm and dry.     Capillary Refill: Capillary refill takes less than 2 seconds.     Findings: Rash present. Rash is macular, papular and scaling.  Neurological:     Mental Status: He is alert and oriented to person, place, and time.  Psychiatric:        Mood and Affect: Mood normal.        Behavior: Behavior normal.    Assessment/Plan: . Uncontrolled type 2 diabetes mellitus with hyperglycemia (HCC) A1C is stable at 6.4, wants freestyle libre sensor, order sent.  - POCT HgB A1C - Continuous Blood Gluc Receiver (FREESTYLE LIBRE READER) DEVI; 1 Device by Does not apply route once for 1 dose.  Dispense: 1 each; Refill: 0  . Essential hypertension Stable, continue current medications.   . Benign mole Dermatology referral for mole removal.  - Ambulatory referral to Dermatology  . Recurrent falls Has had recurrent falls and is at risk for falls. He has been instructed not to get up without assistance but still tries to get up without assistance.   . Abnormality of gait and mobility Pt will get benefit from PT at home  - Ambulatory referral to Physical Therapy    General Counseling: jasn xia understanding of the findings of todays visit and agrees with plan of treatment. I have discussed any further diagnostic evaluation that may be needed or ordered today. We also reviewed his medications today. he has been encouraged to call the office with any questions or concerns that should arise related to todays visit.    Orders Placed This Encounter  Procedures   Ambulatory referral  to Dermatology   Ambulatory referral to Physical Therapy   POCT HgB A1C    Meds ordered this encounter  Medications   DISCONTD:  Continuous Blood Gluc Sensor (FREESTYLE LIBRE 2 SENSOR) MISC    Sig: 1 Device by Does not apply route every 14 (fourteen) days.    Dispense:  3 each    Refill:  3   Continuous Blood Gluc Receiver (FREESTYLE LIBRE READER) DEVI    Sig: 1 Device by Does not apply route once for 1 dose.    Dispense:  1 each    Refill:  0    Return in about 3 months (around 05/30/2021) for F/U, Recheck A1C, Jarita Raval PCP.   Total time spent:20 Minutes Time spent includes review of chart, medications, test results, and follow up plan with the patient.   [place] Controlled Substance Database was reviewed by me.  This patient was seen by Jonetta Osgood, FNP-C in collaboration with Dr. [name] as a part of collaborative care agreement.   Lilliah Priego R. Valetta Fuller, MSN, FNP-C Internal medicine

## 2021-03-02 ENCOUNTER — Other Ambulatory Visit: Payer: Self-pay

## 2021-03-02 MED ORDER — FREESTYLE LIBRE 2 SENSOR MISC: 1.0000 | 3 refills | Status: DC

## 2021-03-06 DIAGNOSIS — I509 Heart failure, unspecified: Secondary | ICD-10-CM | POA: Diagnosis not present

## 2021-03-15 ENCOUNTER — Other Ambulatory Visit: Payer: Self-pay | Admitting: Internal Medicine

## 2021-03-15 DIAGNOSIS — E1142 Type 2 diabetes mellitus with diabetic polyneuropathy: Secondary | ICD-10-CM

## 2021-03-21 ENCOUNTER — Encounter: Payer: Self-pay | Admitting: Nurse Practitioner

## 2021-03-21 ENCOUNTER — Ambulatory Visit (INDEPENDENT_AMBULATORY_CARE_PROVIDER_SITE_OTHER): Payer: Medicare PPO | Admitting: Nurse Practitioner

## 2021-03-21 ENCOUNTER — Other Ambulatory Visit: Payer: Self-pay

## 2021-03-21 VITALS — BP 140/68 | HR 67 | Temp 98.6°F | Resp 16 | Ht 66.0 in | Wt 300.0 lb

## 2021-03-21 DIAGNOSIS — J4489 Other specified chronic obstructive pulmonary disease: Secondary | ICD-10-CM

## 2021-03-21 DIAGNOSIS — E871 Hypo-osmolality and hyponatremia: Secondary | ICD-10-CM

## 2021-03-21 DIAGNOSIS — J449 Chronic obstructive pulmonary disease, unspecified: Secondary | ICD-10-CM | POA: Diagnosis not present

## 2021-03-21 DIAGNOSIS — E1165 Type 2 diabetes mellitus with hyperglycemia: Secondary | ICD-10-CM

## 2021-03-21 DIAGNOSIS — Z0001 Encounter for general adult medical examination with abnormal findings: Secondary | ICD-10-CM

## 2021-03-21 DIAGNOSIS — E1142 Type 2 diabetes mellitus with diabetic polyneuropathy: Secondary | ICD-10-CM

## 2021-03-21 DIAGNOSIS — R296 Repeated falls: Secondary | ICD-10-CM | POA: Diagnosis not present

## 2021-03-21 DIAGNOSIS — I10 Essential (primary) hypertension: Secondary | ICD-10-CM

## 2021-03-21 DIAGNOSIS — Z6841 Body Mass Index (BMI) 40.0 and over, adult: Secondary | ICD-10-CM

## 2021-03-21 DIAGNOSIS — I890 Lymphedema, not elsewhere classified: Secondary | ICD-10-CM | POA: Diagnosis not present

## 2021-03-21 MED ORDER — VICTOZA 18 MG/3ML ~~LOC~~ SOPN: PEN_INJECTOR | SUBCUTANEOUS | 3 refills | Status: DC

## 2021-03-21 MED ORDER — FREESTYLE LIBRE 2 SENSOR MISC: 1.0000 | 3 refills | Status: DC

## 2021-03-21 MED ORDER — FREESTYLE LIBRE READER DEVI: 1.0000 | Freq: Once | 0 refills | Status: AC

## 2021-03-21 NOTE — Progress Notes (Signed)
[facility] [place], [place] [address]  Internal MEDICINE  Office Visit Note  Patient Name: Adam Merritt  [id number]  [id number]  Date of Service: 03/21/2021  Chief Complaint  Patient presents with   Medicare Wellness   Diabetes   Gastroesophageal Reflux   Hyperlipidemia   Hypertension   Anemia   COPD    HPI Adam Merritt presents for an annual well visit and physical exam. He has had issues with problems with falling recently and was seen in the ER for a fall in September. He followed up with [facility] on 02/28/21. He did not sustain any major injuries and the CT head done in the ER was negative for any acute intracranial abnormality.  His sodium level on his BMP on 02/24/21 was 134. Discussed with patient.  He is interested in the freestyle libre CGM sensors and device. He would like to try it and needs a printed prescription to take to the [place] since that is where he gets his medications.  His lower extremities have been wrapped at the lymphedema clinic so his foot exam was not done today. He has severe neuropathy in his feet. His wife takes care of him and looks at his feet. The wound clinic staff also examine his feet when they wrap his legs. He always come into the clinic in a wheelchair. Walking is very difficult for him especially due to how tender and painful his feet are.  He had his diabetic eye exam in august this year.  He had a COPD exacerbation in early September and was treated outpatient for it with Augmentin and prednisone. His breathing is at his baseline now with mild wheezing off and on. He is on flovent for maintenance. He has duoneb treatments as needed. No other inhalers.  Using ketotifen eye drops for itchy eyes as needed.   Current Medication: Outpatient Encounter Medications as of 03/21/2021  Medication Sig   albuterol (VENTOLIN HFA) 108 (90 Base) MCG/ACT inhaler Inhale 2 puffs into the lungs every 6 (six) hours as needed for wheezing or  shortness of breath.   aspirin 325 MG EC tablet Take 325 mg by mouth daily.   atorvastatin (LIPITOR) 40 MG tablet Take 40 mg by mouth at bedtime.    benzonatate (TESSALON) 100 MG capsule Take 1 capsule (100 mg total) by mouth 2 (two) times daily as needed for cough.   carvedilol (COREG) 25 MG tablet Take 25 mg by mouth 2 (two) times daily with a meal.   Cholecalciferol (VITAMIN D3 PO) Take by mouth.   [EXPIRED] Continuous Blood Gluc Receiver (FREESTYLE LIBRE READER) DEVI 1 Device by Does not apply route once for 1 dose.   docusate sodium (COLACE) 50 MG capsule Take 50 mg by mouth 2 (two) times daily.   febuxostat (ULORIC) 40 MG tablet Take 1 tablet (40 mg total) by mouth daily.   fluticasone (FLOVENT HFA) 110 MCG/ACT inhaler Take 2 puffs in am and pm  for copd // chronic bronchitis ( rinse mouth)   furosemide (LASIX) 40 MG tablet TAKE 1 TABLET(40 MG) BY MOUTH DAILY   gabapentin (NEURONTIN) 300 MG capsule TAKE 2 CAPSULES BY MOUTH EVERY MORNING, 1 CAPSULE EVERY EVENING AND 2 CAPSULES AT NIGHT   glucose blood test strip Use as instructed to check blood sugars twice daily   hydrALAZINE (APRESOLINE) 25 MG tablet Take 25 mg by mouth 3 (three) times daily.    Insulin Glargine (BASAGLAR KWIKPEN) 100 UNIT/ML INJECT 30 TO 36  UNITS UNDER THE SKIN EVERY DAY   Insulin Pen Needle (BD PEN NEEDLE NANO U/F) 32G X 4 MM MISC Use as directed with insulin DX e11.65   ipratropium-albuterol (DUONEB) 0.5-2.5 (3) MG/3ML SOLN Take 3 mLs by nebulization every 4 (four) hours as needed (for shortness of breath).   ketotifen (ZADITOR) 0.025 % ophthalmic solution INSTILL 1 DROP IN BOTH EYES TWICE A DAY   Multiple Vitamin (MULTIVITAMIN WITH MINERALS) TABS tablet Take 1 tablet by mouth daily.   ondansetron (ZOFRAN) 4 MG tablet Take 1 tablet (4 mg total) by mouth every 8 (eight) hours as needed.   OneTouch Delica Lancets 03J MISC Use twice daily diag e11.65   pentoxifylline (TRENTAL) 400 MG CR tablet TAKE 1 TABLET BY MOUTH THREE  TIMES DAILY FOR CLAUDICATION   potassium chloride SA (KLOR-CON) 20 MEQ tablet Take 1 tablet (20 mEq total) by mouth 2 (two) times daily.   prazosin (MINIPRESS) 1 MG capsule TAKE THREE CAPSULES BY MOUTH EVERY EVENING FOR NIGHTMARES   sennosides-docusate sodium (SENOKOT-S) 8.6-50 MG tablet Take 1-2 tablets by mouth 2 (two) times daily.   sertraline (ZOLOFT) 100 MG tablet Take 100 mg by mouth daily.    tiZANidine (ZANAFLEX) 2 MG tablet Take 1 tablet (2 mg total) by mouth 3 (three) times daily.   traZODone (DESYREL) 50 MG tablet Take 75 mg by mouth at bedtime.   triamcinolone cream (KENALOG) 0.1 % Apply 1 application topically 2 (two) times daily.   [DISCONTINUED] Continuous Blood Gluc Sensor (FREESTYLE LIBRE 2 SENSOR) MISC 1 Device by Does not apply route every 14 (fourteen) days.   [DISCONTINUED] liraglutide (VICTOZA) 18 MG/3ML SOPN INJECT 1.8MG  INTO THE SKIN DAILY   Continuous Blood Gluc Sensor (FREESTYLE LIBRE 2 SENSOR) MISC 1 Device by Does not apply route every 14 (fourteen) days.   liraglutide (VICTOZA) 18 MG/3ML SOPN INJECT 1.8MG  INTO THE SKIN DAILY   No facility-administered encounter medications on file as of 03/21/2021.    Surgical History: Past Surgical History:  Procedure Laterality Date   AMPUTATION TOE Right 06/23/2018   Procedure: AMPUTATION TOE;  Surgeon: Sharlotte Alamo, DPM;  Location: [facility] ORS;  Service: Podiatry;  Laterality: Right;   CHOLECYSTECTOMY     CORONARY ARTERY BYPASS GRAFT      Medical History: Past Medical History:  Diagnosis Date   Anemia    CAD (coronary artery disease)    Chest pain    Coronary artery disease    Diabetes mellitus without complication (HCC)    Diastolic CHF ([place])    Esophageal reflux    Herpes zoster without mention of complication    Hyperlipidemia    Hypertension    Insomnia    Lumbago    Lymphedema    Neuropathy in diabetes (HCC)    Obstructive chronic bronchitis without exacerbation (HCC)    Other malaise and fatigue    Stroke  ([place])    Varicose veins     Family History: Family History  Problem Relation Age of Onset   Diabetes Mother    Hyperlipidemia Mother    Hypertension Mother    Diabetes Father    Hyperlipidemia Father    Hypertension Father    CAD Father     Social History   Socioeconomic History   Marital status: Married    Spouse name: Not on file   Number of children: Not on file   Years of education: Not on file   Highest education level: Not on file  Occupational History   Occupation:  retired  Tobacco Use   Smoking status: Never   Smokeless tobacco: Never  Vaping Use   Vaping Use: Never used  Substance and Sexual Activity   Alcohol use: No    Comment: quit alcohol 30 years ago   Drug use: No   Sexual activity: Not Currently  Other Topics Concern   Not on file  Social History Narrative   Lives at home with wife, uses a wheelchair now   Social Determinants of Health   Financial Resource Strain: Not on file  Food Insecurity: Not on file  Transportation Needs: Not on file  Physical Activity: Not on file  Stress: Not on file  Social Connections: Not on file  Intimate Partner Violence: Not on file      Review of Systems  Constitutional:  Negative for activity change, appetite change, chills, fatigue, fever and unexpected weight change.  HENT: Negative.  Negative for congestion, ear pain, rhinorrhea, sore throat and trouble swallowing.   Eyes: Negative.   Respiratory:  Positive for cough, shortness of breath and wheezing. Negative for chest tightness.   Cardiovascular: Negative.  Negative for chest pain.  Gastrointestinal: Negative.  Negative for abdominal pain, blood in stool, constipation, diarrhea, nausea and vomiting.  Endocrine: Negative.   Genitourinary: Negative.  Negative for difficulty urinating, dysuria, frequency, hematuria and urgency.  Musculoskeletal: Negative.  Negative for arthralgias, back pain, joint swelling, myalgias and neck pain.  Skin: Negative.   Negative for rash and wound.  Allergic/Immunologic: Negative.  Negative for immunocompromised state.  Neurological:  Positive for weakness. Negative for dizziness, seizures, numbness and headaches.  Hematological: Negative.   Psychiatric/Behavioral: Negative.  Negative for behavioral problems, self-injury, sleep disturbance and suicidal ideas. The patient is not nervous/anxious.    Vital Signs: BP 140/68   Pulse 67   Temp 98.6 F (37 C)   Resp 16   Ht 5\' 6"  (1.676 m)   Wt 300 lb (136.1 kg)   SpO2 97%   BMI 48.42 kg/m    Physical Exam Vitals reviewed.  Constitutional:      General: He is not in acute distress.    Appearance: Normal appearance. He is well-developed. He is obese. He is ill-appearing. He is not diaphoretic.  HENT:     Head: Normocephalic and atraumatic.     Right Ear: Tympanic membrane, ear canal and external ear normal.     Left Ear: Tympanic membrane, ear canal and external ear normal.     Nose: Nose normal. No congestion or rhinorrhea.     Mouth/Throat:     Mouth: Mucous membranes are moist.     Pharynx: Oropharynx is clear. No oropharyngeal exudate or posterior oropharyngeal erythema.  Eyes:     General: No scleral icterus.       Right eye: No discharge.        Left eye: No discharge.     Conjunctiva/sclera: Conjunctivae normal.     Pupils: Pupils are equal, round, and reactive to light.  Neck:     Thyroid: No thyromegaly.     Vascular: No carotid bruit or JVD.     Trachea: No tracheal deviation.  Cardiovascular:     Rate and Rhythm: Normal rate and regular rhythm.     Pulses: Normal pulses.     Heart sounds: Normal heart sounds. No murmur heard.   No friction rub. No gallop.  Pulmonary:     Effort: Pulmonary effort is normal. No accessory muscle usage or respiratory distress.  Breath sounds: Normal air entry. No stridor. Examination of the right-middle field reveals wheezing. Examination of the left-middle field reveals wheezing. Examination of  the right-lower field reveals wheezing. Examination of the left-lower field reveals wheezing. Wheezing present. No rales.  Chest:     Chest wall: No tenderness.  Abdominal:     General: Bowel sounds are normal. There is no distension.     Palpations: Abdomen is soft. There is no mass.     Tenderness: There is no abdominal tenderness. There is no guarding or rebound.  Musculoskeletal:        General: No tenderness or deformity. Normal range of motion.     Cervical back: Normal range of motion and neck supple.  Lymphadenopathy:     Cervical: No cervical adenopathy.  Skin:    General: Skin is warm and dry.     Capillary Refill: Capillary refill takes less than 2 seconds.     Coloration: Skin is not pale.     Findings: No erythema or rash.  Neurological:     Mental Status: He is alert and oriented to person, place, and time.     Cranial Nerves: No cranial nerve deficit.     Motor: No abnormal muscle tone.     Coordination: Coordination normal.     Deep Tendon Reflexes: Reflexes are normal and symmetric.  Psychiatric:        Behavior: Behavior normal.        Thought Content: Thought content normal.        Judgment: Judgment normal.       Assessment/Plan: 1. Encounter for general adult medical examination with abnormal findings Age-appropriate preventive screenings and vaccinations discussed, annual physical exam completed. Recently had labs drawn in the ER. Sodium and lipid panel ordered patient may be getting labs drawn at the [place] next time he goes, lab orders sent to [facility] just in case he does not have labs drawn at [facility]. PHM updated.   2. Obstructive chronic bronchitis without exacerbation ([place]) Takes flovent twice daily. Will discuss switching to trelegy or breztri at next office visit. His baseline involves some degree of SOB and mild intermittent wheezing.   3. Uncontrolled type 2 diabetes mellitus with hyperglycemia [facility]) Prescription given to patient for freestyle libre  sensors and reader device. Victoza refills ordered - Continuous Blood Gluc Sensor (FREESTYLE LIBRE 2 SENSOR) MISC; 1 Device by Does not apply route every 14 (fourteen) days.  Dispense: 3 each; Refill: 3 - Continuous Blood Gluc Receiver (FREESTYLE LIBRE READER) DEVI; 1 Device by Does not apply route once for 1 dose.  Dispense: 1 each; Refill: 0 - liraglutide (VICTOZA) 18 MG/3ML SOPN; INJECT 1.8MG  INTO THE SKIN DAILY  Dispense: 27 mL; Refill: 3 -Lipid profile  4. Essential hypertension Stable with current medications, continue as prescribed.   5. Recurrent falls Has had recurrent falls. He is unable to ambulate on his own but he tries despite knowing this. He has also fallen out of his wheelchair at home. Imaging showed no acute injury.   6. Lymphedema of both lower extremities Followed by lymphedema clinic [facility] center. Lags are wrapped at today's office visit.   7. Diabetic polyneuropathy associated with type 2 diabetes mellitus (HCC) Severe neuropathy that makes ambulation very difficult.   8. Hyponatremia Sodium level was 134 in September. Will repeat sodium level to reassess. Patient may be getting labs drawn soon at the [place], otherwise there is a lab order at [facility] for it.  -Sodium  9. BMI 45.0-49.9, adult (HCC) Elevated BMI, patient continues to work on diet modifications to keep a diabetic diet and he is also on medications for diabetes that may cause weight loss. He is unable to do adequate physical activity to aid in weight loss     General Counseling: Wendal verbalizes understanding of the findings of todays visit and agrees with plan of treatment. I have discussed any further diagnostic evaluation that may be needed or ordered today. We also reviewed his medications today. he has been encouraged to call the office with any questions or concerns that should arise related to todays visit.    Orders Placed This Encounter  Procedures   Sodium   Lipid Profile    Meds  ordered this encounter  Medications   Continuous Blood Gluc Sensor (FREESTYLE LIBRE 2 SENSOR) MISC    Sig: 1 Device by Does not apply route every 14 (fourteen) days.    Dispense:  3 each    Refill:  3   Continuous Blood Gluc Receiver (FREESTYLE LIBRE READER) DEVI    Sig: 1 Device by Does not apply route once for 1 dose.    Dispense:  1 each    Refill:  0   liraglutide (VICTOZA) 18 MG/3ML SOPN    Sig: INJECT 1.8MG  INTO THE SKIN DAILY    Dispense:  27 mL    Refill:  3    Return in about 3 months (around 06/21/2021) for F/U, Recheck A1C, Yaritza Leist PCP. Discuss switching inhalers.    Total time spent:30 Minutes Time spent includes review of chart, medications, test results, and follow up plan with the patient.   [place] Controlled Substance Database was reviewed by me.  This patient was seen by Jonetta Osgood, FNP-C in collaboration with Dr. [name] as a part of collaborative care agreement.  Shaunn Tackitt R. Valetta Fuller, MSN, FNP-C Internal medicine

## 2021-03-25 ENCOUNTER — Encounter: Payer: Medicare PPO | Attending: Physician Assistant | Admitting: Physician Assistant

## 2021-03-25 ENCOUNTER — Other Ambulatory Visit: Payer: Self-pay

## 2021-03-25 DIAGNOSIS — L97812 Non-pressure chronic ulcer of other part of right lower leg with fat layer exposed: Secondary | ICD-10-CM | POA: Diagnosis not present

## 2021-03-25 DIAGNOSIS — I890 Lymphedema, not elsewhere classified: Secondary | ICD-10-CM | POA: Diagnosis not present

## 2021-03-25 DIAGNOSIS — E11622 Type 2 diabetes mellitus with other skin ulcer: Secondary | ICD-10-CM | POA: Insufficient documentation

## 2021-03-25 DIAGNOSIS — I872 Venous insufficiency (chronic) (peripheral): Secondary | ICD-10-CM | POA: Insufficient documentation

## 2021-03-25 DIAGNOSIS — L97822 Non-pressure chronic ulcer of other part of left lower leg with fat layer exposed: Secondary | ICD-10-CM | POA: Diagnosis not present

## 2021-03-25 NOTE — Progress Notes (Signed)
TRAVANTI, MCMANUS ([id number]) Visit Report for 03/25/2021 Chief Complaint Document Details Patient Name: Adam Merritt, Adam Merritt Date of Service: 03/25/2021 10:15 AM Medical Record Number: [id number] Patient Account Number: [id number] Date of Birth/Sex: 12/21/1945 (75 y.o. M) Treating RN: Carlene Coria Primary Care Provider: [name] Other Clinician: Referring Provider: Referral, Self Treating Provider/Extender: Skipper Cliche in Treatment: 0 Information Obtained from: Patient Chief Complaint Bilateral LE edema and weeping ulcers Electronic Signature(s) Signed: 03/25/2021 11:45:40 AM By: Worthy Keeler PA-C Previous Signature: 03/25/2021 10:45:01 AM Version By: Worthy Keeler PA-C Previous Signature: 03/25/2021 10:43:33 AM Version By: Worthy Keeler PA-C Entered By: Worthy Keeler on 03/25/2021 11:45:40 Adam Merritt ([id number]) -------------------------------------------------------------------------------- Debridement Details Patient Name: Adam Merritt Date of Service: 03/25/2021 10:15 AM Medical Record Number: [id number] Patient Account Number: [id number] Date of Birth/Sex: January 19, 1946 (75 y.o. M) Treating RN: Carlene Coria Primary Care Provider: [name] Other Clinician: Referring Provider: Referral, Self Treating Provider/Extender: Skipper Cliche in Treatment: 0 Debridement Performed for Wound #42 Right,Medial Lower Leg Assessment: Performed By: Physician Tommie Sams., PA-C Debridement Type: Debridement Severity of Tissue Pre Debridement: Fat layer exposed Level of Consciousness (Pre- Awake and Alert procedure): Pre-procedure Verification/Time Out Yes - 11:05 Taken: Start Time: 11:05 Total Area Debrided (L x W): 0.5 (cm) x 0.5 (cm) = 0.25 (cm) Tissue and other material Viable, Non-Viable, Slough, Subcutaneous, Skin: Dermis , Skin: Epidermis, Slough debrided: Level: Skin/Subcutaneous Tissue Debridement Description: Excisional Instrument:  Curette Bleeding: Minimum Hemostasis Achieved: Pressure End Time: 11:10 Procedural Pain: 0 Post Procedural Pain: 0 Response to Treatment: Procedure was tolerated well Level of Consciousness (Post- Awake and Alert procedure): Post Debridement Measurements of Total Wound Length: (cm) 0.5 Width: (cm) 0.5 Depth: (cm) 0.1 Volume: (cm) 0.02 Character of Wound/Ulcer Post Debridement: Improved Severity of Tissue Post Debridement: Fat layer exposed Post Procedure Diagnosis Same as Pre-procedure Electronic Signature(s) Signed: 03/25/2021 4:19:44 PM By: Carlene Coria RN Signed: 03/25/2021 4:28:58 PM By: Worthy Keeler PA-C Entered By: Carlene Coria on 03/25/2021 11:10:32 Adam Merritt ([id number]) -------------------------------------------------------------------------------- Debridement Details Patient Name: Adam Merritt Date of Service: 03/25/2021 10:15 AM Medical Record Number: [id number] Patient Account Number: [id number] Date of Birth/Sex: [birth year] (75 y.o. M) Treating RN: Carlene Coria Primary Care Provider: [name] Other Clinician: Referring Provider: Referral, Self Treating Provider/Extender: Skipper Cliche in Treatment: 0 Debridement Performed for Wound #41 Right,Circumferential Lower Leg Assessment: Performed By: Physician Tommie Sams., PA-C Debridement Type: Chemical/Enzymatic/Mechanical Agent Used: saline and gauze Level of Consciousness (Pre- Awake and Alert procedure): Pre-procedure Verification/Time Out Yes - 11:05 Taken: Start Time: 11:05 Instrument: Other : saline gauze Bleeding: Minimum Hemostasis Achieved: Pressure End Time: 11:10 Procedural Pain: 0 Post Procedural Pain: 0 Response to Treatment: Procedure was tolerated well Level of Consciousness (Post- Awake and Alert procedure): Post Debridement Measurements of Total Wound Length: (cm) 12 Width: (cm) 54 Depth: (cm) 0.1 Volume: (cm) 50.894 Character of Wound/Ulcer Post  Debridement: Improved Post Procedure Diagnosis Same as Pre-procedure Electronic Signature(s) Signed: 03/25/2021 4:19:44 PM By: Carlene Coria RN Signed: 03/25/2021 4:28:58 PM By: Worthy Keeler PA-C Entered By: Carlene Coria on 03/25/2021 11:11:21 Adam Merritt ([id number]) -------------------------------------------------------------------------------- HPI Details Patient Name: Adam Merritt Date of Service: 03/25/2021 10:15 AM Medical Record Number: [id number] Patient Account Number: [id number] Date of Birth/Sex: [birth year] (75 y.o. M) Treating RN: Carlene Coria Primary Care Provider: [name] Other Clinician: Referring Provider: Referral, Self Treating Provider/Extender: Skipper Cliche in Treatment: 0 History of Present Illness HPI Description: 10/18/17-He  is here for initial evaluation of bilateral lower extremity ulcerations in the presence of venous insufficiency and lymphedema. He has been seen by vascular medicine in the past, Dr. Lucky Cowboy, last seen in 2016. He does have a history of abnormal ABIs, which is to be expected given his lymphedema and venous insufficiency. According to Epic, it appears that all attempts for arterial evaluation and/or angiography were not follow through with by patient. He does have a history of being seen in lymphedema clinic in 2018, stopped going approximately 6 months ago stating "it didn't do any good". He does not have lymphedema pumps, he does not have custom fit compression wrap/stockings. He is diabetic and his recent A1c last month was 7.6. He admits to chronic bilateral lower extremity pain, no change in pain since blister and ulceration development. He is currently being treated with Levaquin for bronchitis. He has home health and we will continue. 10/25/17-He is here in follow-up evaluation for bilateral lower extremity ulcerationssubtle he remains on Levaquin for bronchitis. Right lower extremity with no evidence of drainage or ulceration,  persistent left lower extremity ulceration. He states that home health has not been out since his appointment. He went to [facility] on Tuesday, studies revealed: RIGHT ABI 0.9, TBI 0.6 LEFT ABI 1.1, TBI 0.6 with triphasic flow bilaterally. We will continue his same treatment plan. He has been educated on compression therapy and need for elevation. He will benefit from lymphedema pumps 11/01/17-He is here in follow-up evaluation for left lower extremity ulcer. The right lower extremity remains healed. He has home health services, but they have not been out to see the patient for 2-3 weeks. He states it home health physical therapy changed his dressing yesterday after therapy; he placed Ace wrap compression. We are still waiting for lymphedema pumps, reordered d/t need for company change. 11/08/17-He is here in follow-up evaluation for left lower extremity ulcer. It is improved. Edema is significantly improved with compression therapy. We will continue with same treatment plan and he will follow-up next week. No word regarding lymphedema pumps 11/15/17-He is here in follow-up evaluation for left lower extremity ulcer. He is healed and will be discharged from wound care services. I have reached out to [facility] regarding his lymphedema pumps. They have been unable to reach the patient; the contact number they had with the patient's wife's cell phone and she has not answered any unrecognized calls. Contact should be made today, trial planned for next week; [facility] will continue to follow 11/27/17 on evaluation today patient has multiple blistered areas over the right lower extremity his left lower extremity appears to be doing okay. These blistered areas show signs of no infection which is great news. With that being said he did have some necrotic skin overlying which was mechanically debrided away with saline and gauze today without complication. Overall post debridement the  wounds appear to be doing better but in general his swelling seems to be increased. This is obviously not good news. I think this is what has given rise to the blisters. 12/04/17 on evaluation today patient presents for follow-up concerning his bilateral lower extremity edema in the right lower extremity ulcers. He has been tolerating the dressing changes without complication. With that being said he has had no real issues with the wraps which is also good news. Overall I'm pleased with the progress he's been making. 12/11/17 on evaluation today patient appears to be doing rather well in regard to his right lateral  lower extremity ulcer. He's been tolerating the dressing changes without complication. Fortunately there does not appear to be any evidence of infection at this time. Overall I'm pleased with the progress that is being made. Unfortunately he has been in the hospital due to having what sounds to be a stomach virus/flu fortunately that is starting to get better. 12/18/17 on evaluation today patient actually appears to be doing very well in regard to his bilateral lower extremities the swelling is under fairly good control his lymphedema pumps are still not up and running quite yet. With that being said he does have several areas of opening noted as far as wounds are concerned mainly over the left lower extremity. With that being said I do believe once he gets lymphedema pumps this would least help you mention some the fluid and preventing this from occurring. Hopefully that will be set up soon sleeves are Artie in place at his home he just waiting for the machine. 12/25/17 on evaluation today patient actually appears to be doing excellent in fact all of his ulcers appear to have resolved his legs appear very well. I do think he needs compression stockings we have discussed this and they are actually going to go to [place] today to elastic therapy to get this fitted for him. I think that is  definitely a good thing to do. Readmission: 04/09/18 upon evaluation today this patient is seen for readmission due to bilateral lower extremity lymphedema. He has significant swelling of his extremities especially on the left although the right is also swollen he has weeping from both sides. There are no obvious open wounds at this point. Fortunately he has been doing fairly well for quite a bit of time since I last saw him. Nonetheless unfortunately this seems to have reopened and is giving quite a bit of trouble. He states this began about a week ago when he first called [place] to get in to be seen. No fevers, chills, nausea, or vomiting noted at this time. He has not been using his lymphedema pumps due to the fact that they won't fit on his leg at this point likewise is also not been using his compression for essentially the same reason. 04/16/18 upon evaluation today patient actually appears to be doing a little better in regard to the fluid in his bilateral lower extremities. With that being said he's had three falls since I saw him last week. He also states that he's been feeling very poorly. I was concerned last week and feel like that the concern is still there as far as the congestion in his chest is concerned he seems to be breathing about the same as last week but again he states he's very weak he's not even able to walk further than from the chair to the door. His wife had to buy a wheelchair just to be able to get them out of the house to get to the appointment today. This has me very concerned. 04/23/18 on evaluation today patient actually appears to be doing much better than last week's evaluation. At that time actually had to transport him to the ER via EMS and he subsequently was admitted for acute pulmonary edema, acute renal failure, and acute congestive heart failure. Fortunately he is doing much better. Apparently they did dialyze him and were able to take off roughly 35 pounds of  fluid. Nonetheless he is feeling much better both in regard to his breathing and he's able to get around much better  at this time compared to previous. Overall I'm very Adam Merritt, Adam Merritt ([id number]) happy with how things are at this time. There does not appear to be any evidence of infection currently. No fevers, chills, nausea, or vomiting noted at this time. 04/30/2018 patient seen today for follow-up and management of bilateral lower extremity lymphedema. He did express being more sad today than usual due to the recent loss of his dog. He states that he has been compliant with using the lymphedema pumps. However he does admit a minute over the last 2-3 days he has not been using the pumps due to the recent loss of his dog. At this time there is no drainage or open wounds to his lower extremities. The left leg edema is measuring smaller today. Still has a significant amount of edema on bilateral lower extremities With dry flaky skin. He will be referred to the lymphedema clinic for further management. Will continue 3 layer compression wraps and follow-up in 1-2 weeks.Denies any pain, fever, chairs recently. No recent falls or injuries reported during this visit. 05/07/18 on evaluation today patient actually appears to be doing very well in regard to his lower extremities in general all things considered. With that being said he is having some pain in the legs just due to the amount of swelling. He does have an area where he had a blister on the left lateral lower extremity this is open at this point other than that there's nothing else weeping at this time. 05/14/18 on evaluation today patient actually appears to be doing excellent all things considered in regard to his lower extremities. He still has a couple areas of weeping on each leg which has continued to be the issue for him. He does have an appointment with the lymphedema clinic although this isn't until February 2020. That was the earliest  they had. In the meantime he has continued to tolerate the compression wraps without complication. 05/28/18 on evaluation today patient actually appears to be doing more poorly in regard to his left lower extremity where he has a wound open at this point. He also had a fall where he subsequently injured his right great toe which has led to an open wound at the site unfortunately. He has been tolerating the dressing changes without complication in general as far as the wraps are concerned that he has not been putting any dressing on the left 1st toe ulcer site. 06/11/18 on evaluation today patient appears to be doing much worse in regard to his bilateral lower extremity ulcers. He has been tolerating the dressing changes without complication although his legs have not been wrapped more recently. Overall I am not very pleased with the way his legs appear. I do believe he needs to be back in compression wraps he still has not received his compression wraps from the [facility] hospital as of yet. 06/18/18 on evaluation today patient actually appears to be doing significantly better than last time I saw him. He has been tolerating the compression wraps without complication in the circumferential ulcers especially appear to be doing much better. His toe ulcer on the right in regard to the great toe is better although not as good as the legs in my pinion. No fevers chills noted 07/02/18 on evaluation today patient appears to be doing much better in regard to his lower extremity ulcers. Unfortunately since I last saw him he's had the distal portion of his right great toe if he dated it sounds as if this  actually went downhill very quickly. I had only seen him a few days prior and the toe did not appear to be infected at that point subsequently became infected very rapidly and it was decided by the surgeon that the distal portion of the toe needed to be removed. The patient seems to be doing well in this regard he tells  me. With that being said his lower extremities are doing better from the standpoint of the wounds although he is significantly swollen at this point. 07/09/18 on evaluation today patient appears to be doing better in regard to the wounds on his lower extremities. In fact everything is almost completely healed he is just a small area on the left posterior lower extremity that is open at this point. 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 With that being said he needs some kind of compression it does sound like he may not be wearing his compression, that is the wraps, during the entire time between when he's here visit to visit. Apparently his wife took the current one off because it began to "fall apart". 07/16/18 on evaluation today patient appears to be extremely swollen especially in regard to his left lower extremity unfortunately. He also has a new skin tear over the left lower extremity and there's a smaller area on the right lower extremity as well. Unfortunately this seems to be due in part to blistering and fluid buildup in his leg. He did get the reduction wraps that were ordered by the [facility] [place] hospital for him to go to lymphedema clinic. With that being said his wounds on the legs have not healed to the point to where they would likely accept them as a patient lymphedema clinic currently. We need to try to get this to heal. With that being said he's been taking his wraps off which is not doing him any favors at this point. In fact this is probably quite counterproductive compared to what needs to occur. We will likely need to increase to a four layer compression wrap and continue to also utilize elevation and he has to keep the wraps on not take them off as he's been doing currently hasn't had a wrap on since  Saturday. 07/23/18 on evaluation today patient appears to be doing much better in regard to his bilateral lower extremities. In fact his left lower extremity which was the largest is actually 15 cm smaller today compared to what it was last time he was here in our clinic. This is obviously good news after just one week. Nonetheless the differences he actually kept the wraps on during the entire week this time. That's not typical for him. I do believe he understands a little bit better now the severity of the situation and why it's important for him to keep these wraps on. 07/30/18 on evaluation today patient actually appears to be doing rather well in regard to his lower extremities. His legs are much smaller than they have been in the past and he actually has only one very small rudder superficial region remaining that is not closed on the left lateral/posterior lower extremity even this is almost completely close. I do believe likely next week he will be healed without any complications. I do think we need to continue the wraps however this seems to be beneficial for him.  I also think it may be a good time for [place] to go ahead and see about getting the appointment with the lymphedema clinic which is supposed to be made for him in order to keep this moving along and hopefully get them into compression wraps that will in the end help him to remain healed. 08/06/18 on evaluation today patient actually appears to be doing very well in regard as bilateral lower extremities. In fact his wounds appear to be completely healed at this time. 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 He is in the process of getting appointment with the lymphedema clinic we have made this referral were just waiting to hear back on the schedule time. We need to follow up on that today as well. 08/13/18 on evaluation today patient actually appears to be doing very well in  regard to his bilateral lower extremities there are no open wounds at this point. We are gonna go ahead and see about ordering the Velcro compression wraps for him had a discussion with them about [place] doing it versus the [place] they feel like they can definitely afford going ahead and get the wraps themselves and they would prefer to try to avoid having to go to the lymphedema clinic if it all possible which I completely understand. As long as he has good compression I'm okay either way. 3/17//20 on evaluation today patient actually appears to be doing well in regard to his bilateral lower extremity ulcers. He has been tolerating the dressing changes without complication specifically the compression wraps. Overall is had no issues my fingers finding that I see at this point is that he is having trouble with constipation. He tells me he has not been able to go to the bathroom for about six days. He's taken to over-the- counter oral laxatives unfortunately this is not helping. He has not contacted his doctor. Adam Merritt, Adam Merritt ([id number]) 08/27/18 on evaluation today patient appears to be doing fairly well in regard to his lower extremities at this point. There does not appear to be any new altars is swelling is very well controlled. We are still waiting for his Velcro compression wraps to arrive that should be sometime in the next week is Artie sent the check for these. 09/03/18 on evaluation today patient appears to be doing excellent in regard to his bilateral lower extremities which he shows no signs of wound openings in regard to this point. He does have his Velcro compression wraps which did arrive in the mail since I saw him last week. Overall he is doing excellent in my pinion. 09/13/18 on evaluation today patient appears to be doing very well currently in regard to the overall appearance of his bilateral lower extremities although he's a little bit more swollen than last time we saw him. At that point  he been discharged without any open wounds. Nonetheless he has a small open wound on the posterior left lower extremity with some evidence of cellulitis noted as well. Fortunately I feel like he has made good progress overall with regard to his lower extremities from were things used to be. 09/20/18 on evaluation today patient actually appears to be doing much better. The erythematous lower extremity is improving wound itself which is still open appears to be doing much better as far as but appearance as well as pain is concerned overall very pleased in this regard. There's no signs of active infection at this time. 09/27/18 on  evaluation today patient's wounds on the lower extremity actually appear to be doing fairly well at this time which is good news. There is no evidence of active infection currently and again is just as left lower extremity were there any wounds at this point anyway. I believe they may be completely healed but again I'm not 100% sure based on evaluation today. I think one more week of observation would likely be a good idea. 10/04/18 on evaluation today patient actually appears to be doing excellent in regard to the left lower extremity which actually appears to be completely healed as of today. Unfortunately he's been having issues with his right lower extremity have a new wound that has opened. Fortunately there's no evidence of active infection at this time which is good news. 10/10/18 upon evaluation today patient actually appears to be doing a little bit worse with the new open area on his right posterior lower extremity. He's been tolerating the dressing changes without complication. Right now we been using his compression wraps although I think we may need to switch back to actually performing bilateral compression wraps are in the clinic. No fevers, chills, nausea, or vomiting noted at this time. 10/17/18 on evaluation today patient actually appears to be doing quite well in  regard to his bilateral lower extremity ulcers. He has been tolerating the reps without complication although he would prefer not to rewrap his legs as of today. Fortunately there's no signs of active infection which is good news. No fevers, chills, nausea, or vomiting noted at this time. 10/24/18 on evaluation today patient appears to be doing very well in regard to his lower extremities. His right lower extremity is shown signs of healing and his left lower [place] though not healed appears to be improving which is excellent news. Overall very pleased with how things seem to be progressing at this time. The patient is likewise happy to hear this. His Velcro compression wraps however have not been put on properly will gonna show his wife how to do that properly today. 10/31/18 on evaluation today patient appears to be doing more poorly in regard to his left lower extremity in particular although both lower extremities actually are showing some signs of being worse than my previous evaluation. Unfortunately I'm just not sure that his compression stockings even with the use of the compression/lymphedema pumps seem to be controlling this well. Upon further questioning he tells me that he also is not able to lie flat in the bed due to his congestive heart failure and difficulty breathing. For that reason he sleeps in his recliner. To make matters worse his recliner also cannot even hold his legs up so instead of even being somewhat elevated they pretty much hang down to the floor. This is the way he sleeps each night which is definitely counterproductive to everything else that were attempting to do from the standpoint of controlling his fluid. Nonetheless I think that he potentially could benefit from a hospital bed although this would be something that his primary care provider would likely have to order since anything that is order on our side has to be directly related to wound care and again the  hospital bed is not necessarily a direct relation to although I think it does contribute to his overall wound status on his lower extremities. 11/07/18 on evaluation today patient appears to be doing better in regard to his bilateral lower extremity. He's been tolerating the dressing changes without complication. We did in  the interim since I last saw him switch to just using extras orbiting new alginate and that seems to have done much better for him. I'm very pleased with the overall progress that is made. 11/14/18 on evaluation today patient appears to be redoing rather well in regard to his left lower extremity ulcers which are the only ones remaining at this point. Fortunately there's no signs of active infection at this time which is good news. Overall been very pleased with how things seem to be progressing currently. No fevers, chills, nausea, or vomiting noted at this time. 11/20/17 on evaluation today patient actually appears to be doing quite well in regard to his lower extremities on the left on the right he has several blisters that showed up although there's some question about whether or not he has had his broker compression wraps on like he was supposed to or not. Fortunately there's no signs of active infection at this time which is good news. Unfortunately though he is doing well from the standpoint of the left leg the right leg is not doing as well again this is when we did not wrap last week. 11/28/18 on evaluation today patient appears to be doing well in regard to his bilateral lower extremities is left appears to be healed is right is not healed but is very close to being so. Overall very pleased with how things seem to be progressing. Patient is likewise happy that things are doing well. 12/09/18 on evaluation today patient actually appears to be completely healed which is excellent news. He actually seems to be doing well in regard to the swelling of the bilateral lower extremities  which is also great news. Overall very pleased with how things seem to be progressing. Readmission: 03/18/2019 upon evaluation today patient presents for reevaluation concerning issues that he is having with his left lower extremity where he does have a wound noted upon inspection today. He also has a wound on the right second toe on the tip where it is apparently been rubbing in his shoe I did look at issues and you can actually see where this has been occurring as well. Fortunately there is no signs of infection with regard to the toe necessarily although it is very swollen compared to normal that does have me somewhat concerned about the possibility of further evaluation for infection/osteomyelitis. I would recommend an x-ray to start with. Otherwise he states that it is been 1-2 weeks that he has had the draining in regard to left lower extremity he does not know how this happened he has been wearing his compression appropriately and I do see that as well today but nonetheless I do think that he needs to continue to be very cautious with regard to elevation as well. Adam Merritt, Adam Merritt ([id number]) 03/25/2019 on evaluation today patient appears to be doing much better in regard to his lower extremity at this time. That is on the left. He did culture positive for Pseudomonas but the good news is he seems to be doing much better the gentamicin we applied topically seems to have done a great job for him. There is no signs of active infection at this time systemically and locally he is doing much better. No fevers, chills, nausea, vomiting, or diarrhea. In regard to his right toe this seems to be doing much better there is very little pressure noted at this point I did clean up some of the callus today around the edges of the wound as well  as the surface of the wound but to be honest he is progressing quite nicely. 10/30; the area on the left anterior lower tibia area is healed over. His edema control is  adequate even though his use of the compression pumps seems very intermittent. He has a juxta lite stocking on the right leg and a think there is 1 for the left leg at home. His wife is putting these on. He has an area on the plantar right second toe which is a hammertoe they are trying to offload this 04/08/2019 on evaluation today patient appears to be doing well in regard to his lower extremities which is good news. We are seeing him today for his toe ulcer which is giving him a little bit more trouble but again still seems to be doing better at this point which is good news. He is going require some sharp debridement in regard to the toe today however. 04/15/2019 on evaluation today patient actually appears to be doing quite well with regard to his toe ulcer. I am very pleased with how things are doing in that regard. With regard to his lower extremity edema in general he tells me he is not been using the lymphedema pumps regularly although he does not use them. I think that he needs to use them more regularly he does have a small blister on the left lower extremity this is not open at this point but obviously it something that can get worse if he does not keep the compression going and the pumps going as well. 04/22/2019 on evaluation today patient appears to be doing well with regard to his toe ulcer. He has been tolerating the dressing changes without complication. This is measuring slightly smaller this week as compared to last week. Fortunately there is no signs of active infection at this time. 05/06/2019 on evaluation today patient actually appears to be doing excellent. In fact his toe appears to be completely healed which is great news. There is no signs of active infection at this time. Readmission: Patient presents today for follow-up after having been discharged at the beginning of December 2020. He states that in recent weeks things have reopened and has been having more trouble at  this point. With that being said fortunately there is no signs of active infection at this time. No fever chills noted. Nonetheless he also does have an issue with one of his toes as well that seems to be somewhat this is on the right foot second toe. 07/25/2019 upon evaluation today patient's lower extremities appear to be doing excellent bilaterally. I really feel like he is showing signs of great improvement and in fact I think he is close to healing which is great news. There is no signs of active infection at this time. No fevers, chills, nausea, vomiting, or diarrhea. 07/31/2019 upon evaluation today patient appears to be doing excellent and in fact I think may be completely healed in regard to his right lower extremity that were still to monitor this 1 more week before closing it out. The left lower extremity is measuring smaller although not completely closed seems to be doing excellent. 08/07/2019 upon evaluation today patient appears to be doing excellent in regard to his wounds. In fact the right lower extremity is completely healed there is no issues here. The left lower extremity has 1 very small area still remaining fortunately there is no signs of infection and overall I think he is very close to closure of the here  as well. 08/14/19 upon evaluation today patient appears to be doing excellent in regard to his lower extremities. In fact he appears to be completely healed as of today. Fortunately there's no signs of active infection at this time. No fevers, chills, nausea, or vomiting noted at this time. READMISSION 09/26/2019 This is a 75 year old man who is well-known to this clinic having just been discharged on 08/14/2019. He has known lymphedema and chronic venous insufficiency. He has Farrow wrap stockings and also external compression pumps. He does not use the external compression pumps however. He does however apparently fairly faithfully use the stockings. They have not been lotion  in his legs. He has developed new wounds on the right anterior as well as the left medial left lateral and left posterior calf. He returns for our review of this Past medical history includes coronary artery disease, hypertension, congestive heart failure, COPD, venous insufficiency with lymphedema, type 2 diabetes. He apparently has compression pumps, Farrow wraps and at 1 point a hospital bed although I believe he sleeps in a recliner 10/03/2019 upon evaluation today patient appears to be doing better with regard to his wounds in general. He has been tolerating the dressing changes without complication. Fortunately there is no signs of active infection. No fevers, chills, nausea, vomiting, or diarrhea. I do believe the compression wraps are helping as they always have in the past. 10/10/2019 upon evaluation today patient appears to be doing excellent at this point. His right lower extremity is measuring much better and in fact is completely healed. His left lower extremity is also measuring better though not healed seems to be doing excellent at this point which is great news. Overall I am very pleased with how things appear currently. 10/27/2019 upon evaluation today patient appears to be doing quite well with regard to his wounds currently. He has been tolerating the dressing changes without complication. Fortunately there is no signs of active infection at this time. In fact he is almost completely healed on the left and has just a very small area open and on the right he is completely healed. 11/04/2019 upon evaluation today patient appears to be doing quite well with regard to his wounds. In fact he appears to be quite possibly completely healed on the left lower extremity now as well the right lower extremity is still doing well. With that being said unfortunately though this may be healed I think it is a very fragile area and I cannot even confirm there is not a very small opening still remaining.  I really think he may benefit from 1 additional weeks wrapping before discontinuing. 11/10/2019 upon evaluation today patient appears to be doing well in regard to his original wounds in fact everything that we were treating last week is completely healed on the left. Unfortunately he has a new skin tear just above where the wrap slipped down due to a fall he sustained causing this injury. 11/17/2019 on evaluation today patient appears to be doing better with regard to his wound. He has been tolerating the dressing changes without Adam Merritt, Adam Merritt. ([id number]) complication. Fortunately there is no signs of active infection at this time. No fevers, chills, nausea, vomiting, or diarrhea. 11/24/2019 upon evaluation today patient appears to be doing well with regard to his wound. This is making good progress there does not appear to be signs of active infection but overall I do feel like he is headed in the correct direction. Overall he is tolerating the compression wrap as  well. 12/02/2019 upon evaluation today patient appears to be doing well with regard to his leg ulcer. In fact this appears to be completely healed and this is the last of the wounds that he had but still the left anterior lower extremity. Fortunately there is no signs of active infection at this time. No fevers, chills, nausea, vomiting, or diarrhea. Readmission: 02/05/2020 on evaluation today patient appears to be doing well with regard to his wound all things considered. He actually tells me that a couple weeks ago he had trouble with his wraps and therefore took him off for a couple of days in order to give his legs a break. It was during this time that he actually developed increased swelling and edema of his legs and blisters on both legs. That is when he called to make the appointment. Nonetheless since that time he began wearing his compression wraps which are Velcro in the meantime and has done extremely well with this. In fact his  leg appears to be under good control as far as edema is concerned today it is nothing like what I am seeing when he has come in for evaluations in the past. They have been using some of the silver alginate at home which has been beneficial for him. Fortunately there is no signs of active infection at this time. No fevers, chills, nausea, vomiting, or diarrhea. 02/19/2020 on evaluation today patient unfortunately does not appear to be doing quite as well as I would like to see. He has no signs of active infection at this time but unfortunately he is not doing well in regard to the overall appearance of his left leg. He has a couple other areas that are weeping now and the leg is much more swollen. I think that he needs to actually have a compression wrap to try to manage this. 02/26/2020 on evaluation today patient appears to be doing well with regard to his left lower extremity ulcer. Fortunately the swelling is down I do believe the pressure wrap seems to be doing quite well. Fortunately there is no signs of active infection at this time. 03/05/2020 upon evaluation today patient appears to be doing excellent in regard to his legs at this point. Fortunately there is no signs of active infection which is great news. Overall he feels like he is completely healed which is great news. Readmission: 05/20/2020 upon evaluation today patient appears to be doing well with regard to his leg ulcer all things considered. He is actually coming back to see [place] after having had a reopening of the ulcers on his left leg in the past several weeks. He is out of dressing supplies and his wife is been try to take care of this as best she can. 06/10/2020 upon evaluation today patient unfortunately appears to be doing significantly worse today compared to when I last saw him. He and his wife both tell me that "there has been a lot going on". I did not actually go into detail about everything that has been happening but  nonetheless he has not obviously been wearing his compression. He brings them with him today but they were not on. I am not even certain to be honest that he could get those on at this point. Nonetheless I do believe that he is going to require compression wrapping at some point shortly but right now we need to try to get what appears to be infection under control at this time. 06/28/2020 upon evaluation today patient appears  to be doing well with regard to 06/28/2020 upon evaluation today patient appears to be doing well pretty well in regard to his left leg which is much better than previous. With that being said in regard to his right leg he does seem to be having some issues with a new wound after he sustained a fall. This fortunately is not too deep but does appear to be something we need to address. Neither deep which is good. 07/05/2020 on evaluation today patient appears to be doing well with regard to his wounds. In fact everything on the left leg appears to be pretty much healed on the right leg this is very close though not completely closed yet. Fortunately there is no evidence of active infection at this time. No fevers, chills, nausea, vomiting, or diarrhea. 07/13/2019 upon evaluation today patient appears to be making good progress which is great news. Overall I am extremely pleased with where things stand today. There does not appear to be any signs of active infection which is great news and overall his left leg appears healed still the right leg though not healed does seem to be making good progress which is excellent news. He is having no pain. 07/19/2020 on evaluation today patient appears to be doing well with regard to his wound. He has been tolerating the dressing changes without complication. Fortunately there does not appear to be any signs of active infection at this time. No fevers, chills, nausea, vomiting, or diarrhea. Readmission: 08/20/2020 on evaluation today patient  presents for reevaluation here in the clinic concerning issues that he has been having with his bilateral lower extremities. Unfortunately this is an ongoing reoccurring issue. He tells me that he is really not certain exactly what caused the issue although as I discussed this further with him it appears that he has been sitting for the most part and sleeping in his chair with his feet on the ground he is not really elevating at all in fact the chair does not even have a reclining function therefore it is basically just him sitting with his feet on the ground. I think this alone has probably led to his increased swelling he is also having more difficulty walking which means that he is pumping less blood flow through his legs anyway as far as the venous stasis is concerned. All in all I think this is leading to a downward spiral of him not being able to walk very well as his legs are so heavy, they begin to weep, and overall he just does not seem to be doing as well as it was when I last saw him. 08/27/2020 upon evaluation today patient's legs actually are showing signs of improvement which is great news. There does not appear to be any evidence of infection which is also excellent news. I am extremely pleased with where things stand today. No fevers, chills, nausea, vomiting, or diarrhea. 09/03/20 upon evaluation today patient appears to be doing excellent in regard to his wounds currently. He has been tolerating the dressing changes without complication and overall I am extremely pleased with where things stand today. No fevers, chills, nausea, vomiting, or diarrhea. Overall he is healed on the right leg although the left leg not being completely healed still is doing significantly better. 09/10/2020/8/22 upon evaluation today patient appears to be doing well with regard to his wounds. He has been tolerating the dressing changes without complication. Fortunately he appears to be completely healed. I am  very pleased in  that regard. As is the patient his right leg is also doing well he did get a new recliner which is helping him keep his feet off the ground which I think is helped he is down 2 cm on the left compared to last week so I think there is some definite improvement here. Adam Merritt, Adam Merritt ([id number]) Readmission 12/15/20: Mr. Trampas Stettner is a 75 year old male with a past medical history of lymphedema, insulin-dependent type 2 diabetes, COPD and congestive heart failure that presents to the clinic for increased drainage from his legs bilaterally, Left greater than right. Patient has lymphedema. He has been seen in our clinic on multiple occasions for this issue. He currently denies signs of infection. He has not been using his lymphedema pumps. 12/23/2020 upon evaluation today patient appears to be doing well with regard to his wounds. He has been tolerating the dressing changes he really does not have any open wounds just weeping areas of the legs bilaterally and fortunately seems to be doing better at this point. 12/28/2020 upon evaluation today patient's wound is actually showing signs of good improvement. There does not appear to be any evidence of infection which is great news and overall I am extremely pleased at this point. I think is very close to complete resolution. 01/04/2021 upon evaluation today patient actually appears to be completely healed which is great news. I do not see any signs of active infection currently which is also great news. In general I am extremely pleased with where we stand and I do believe that the patient is tolerating the dressing changes without complication. I believe he is ready to go back into his Velcro compression and I did see that they brought them with him today. Readmission: Patient presents today for follow-up he was last seen August 2. He is having some issues with some mild drainage and leaking from the left leg. This is something that has  reopened and again is a recurrent issue for him. Subsequently he does wear his Velcro compression wraps though I think we may want to look into something a little bit better for him I am thinking that we may want to try the juxta fit compression which I think would do much more effective compression treatment overall for him. Electronic Signature(s) Signed: 03/25/2021 11:44:27 AM By: Worthy Keeler PA-C Entered By: Worthy Keeler on 03/25/2021 11:44:27 Adam Merritt ([id number]) -------------------------------------------------------------------------------- Physical Exam Details Patient Name: Adam Merritt Date of Service: 03/25/2021 10:15 AM Medical Record Number: [id number] Patient Account Number: [id number] Date of Birth/Sex: [birth year] (75 y.o. M) Treating RN: Carlene Coria Primary Care Provider: [name] Other Clinician: Referring Provider: Referral, Self Treating Provider/Extender: Skipper Cliche in Treatment: 0 Constitutional patient is hypertensive.. pulse regular and within target range for patient.Marland Kitchen respirations regular, non-labored and within target range for patient.Marland Kitchen temperature within target range for patient.. Obese and well-hydrated in no acute distress. Eyes conjunctiva clear no eyelid edema noted. pupils equal round and reactive to light and accommodation. Ears, Nose, Mouth, and Throat no gross abnormality of ear auricles or external auditory canals. normal hearing noted during conversation. mucus membranes moist. Respiratory normal breathing without difficulty. Cardiovascular 2+ dorsalis pedis/posterior tibialis pulses. 1+ pitting edema of the bilateral lower extremities. Musculoskeletal Patient unable to walk without assistance. no significant deformity or arthritic changes, no loss or range of motion, no clubbing. Psychiatric this patient is able to make decisions and demonstrates good insight into disease process. Alert and Oriented x 3. pleasant  and  cooperative. Notes Upon evaluation today patient did not have terrible wounds noted although did have one on the right side. He also had one on the left side as well. On the left this was much more open and draining based on what we are seeing currently. Fortunately I do not see any evidence of active infection systemically which is great news and overall I am very pleased in that regard. Nonetheless I do think he probably needs compression wraps to try to get the edema under control I think we need may be a better long-term option for Velcro compression as well. Electronic Signature(s) Signed: 03/25/2021 12:00:09 PM By: Worthy Keeler PA-C Previous Signature: 03/25/2021 11:46:41 AM Version By: Worthy Keeler PA-C Entered By: Worthy Keeler on 03/25/2021 12:00:09 Adam Merritt ([id number]) -------------------------------------------------------------------------------- Physician Orders Details Patient Name: Adam Merritt Date of Service: 03/25/2021 10:15 AM Medical Record Number: [id number] Patient Account Number: [id number] Date of Birth/Sex: [birth year] (75 y.o. M) Treating RN: Carlene Coria Primary Care Provider: [name] Other Clinician: Referring Provider: Referral, Self Treating Provider/Extender: Skipper Cliche in Treatment: 0 Verbal / Phone Orders: No Diagnosis Coding ICD-10 Coding Code Description I89.0 Lymphedema, not elsewhere classified I87.2 Venous insufficiency (chronic) (peripheral) L97.812 Non-pressure chronic ulcer of other part of right lower leg with fat layer exposed Follow-up Appointments o Return Appointment in 1 week. Bathing/ Shower/ Hygiene o May shower with wound dressing protected with water repellent cover or cast protector. Edema Control - Lymphedema / Segmental Compressive Device / Other o Elevate, Exercise Daily and Avoid Standing for Long Periods of Time. o Elevate legs to the level of the heart and pump ankles as often as  possible o Elevate leg(s) parallel to the floor when sitting. Off-Loading o Turn and reposition every 2 hours Wound Treatment Wound #41 - Lower Leg Wound Laterality: Right, Circumferential Primary Dressing: Silvercel 4 1/4x 4 1/4 (in/in) 1 x Per Week/30 Days Discharge Instructions: Apply Silvercel 4 1/4x 4 1/4 (in/in) as instructed Secondary Dressing: ABD Pad 5x9 (in/in) 1 x Per Week/30 Days Discharge Instructions: Cover with ABD pad Compression Wrap: Medichoice 4 layer Compression System, 35-40 mmHG 1 x Per Week/30 Days Discharge Instructions: Apply multi-layer wrap as directed. Wound #42 - Lower Leg Wound Laterality: Right, Medial Primary Dressing: Silvercel 4 1/4x 4 1/4 (in/in) 1 x Per Week/30 Days Discharge Instructions: Apply Silvercel 4 1/4x 4 1/4 (in/in) as instructed Secondary Dressing: ABD Pad 5x9 (in/in) 1 x Per Week/30 Days Discharge Instructions: Cover with ABD pad Compression Wrap: Medichoice 4 layer Compression System, 35-40 mmHG 1 x Per Week/30 Days Discharge Instructions: Apply multi-layer wrap as directed. Electronic Signature(s) Signed: 03/25/2021 4:19:44 PM By: Carlene Coria RN Signed: 03/25/2021 4:28:58 PM By: Worthy Keeler PA-C Entered By: Carlene Coria on 03/25/2021 11:13:46 Adam Merritt ([id number]) -------------------------------------------------------------------------------- Problem List Details Patient Name: Adam Merritt Date of Service: 03/25/2021 10:15 AM Medical Record Number: [id number] Patient Account Number: [id number] Date of Birth/Sex: [birth year] (75 y.o. M) Treating RN: Carlene Coria Primary Care Provider: [name] Other Clinician: Referring Provider: Referral, Self Treating Provider/Extender: Skipper Cliche in Treatment: 0 Active Problems ICD-10 Encounter Code Description Active Date MDM Diagnosis I89.0 Lymphedema, not elsewhere classified 03/25/2021 No Yes I87.2 Venous insufficiency (chronic) (peripheral) 03/25/2021 No  Yes L97.812 Non-pressure chronic ulcer of other part of right lower leg with fat layer 03/25/2021 No Yes exposed L97.822 Non-pressure chronic ulcer of other part of left lower leg with fat layer 03/25/2021 No Yes exposed Inactive  Problems Resolved Problems Electronic Signature(s) Signed: 03/25/2021 12:57:55 PM By: Worthy Keeler PA-C Previous Signature: 03/25/2021 11:45:00 AM Version By: Worthy Keeler PA-C Previous Signature: 03/25/2021 10:44:42 AM Version By: Worthy Keeler PA-C Previous Signature: 03/25/2021 10:43:26 AM Version By: Worthy Keeler PA-C Entered By: Worthy Keeler on 03/25/2021 12:57:55 Adam Merritt ([id number]) -------------------------------------------------------------------------------- Progress Note Details Patient Name: Adam Merritt Date of Service: 03/25/2021 10:15 AM Medical Record Number: [id number] Patient Account Number: [id number] Date of Birth/Sex: [birth year] (75 y.o. M) Treating RN: Carlene Coria Primary Care Provider: [name] Other Clinician: Referring Provider: Referral, Self Treating Provider/Extender: Skipper Cliche in Treatment: 0 Subjective Chief Complaint Information obtained from Patient Bilateral LE edema and weeping ulcers History of Present Illness (HPI) 10/18/17-He is here for initial evaluation of bilateral lower extremity ulcerations in the presence of venous insufficiency and lymphedema. He has been seen by vascular medicine in the past, Dr. Lucky Cowboy, last seen in 2016. He does have a history of abnormal ABIs, which is to be expected given his lymphedema and venous insufficiency. According to Epic, it appears that all attempts for arterial evaluation and/or angiography were not follow through with by patient. He does have a history of being seen in lymphedema clinic in 2018, stopped going approximately 6 months ago stating "it didn't do any good". He does not have lymphedema pumps, he does not have custom fit compression  wrap/stockings. He is diabetic and his recent A1c last month was 7.6. He admits to chronic bilateral lower extremity pain, no change in pain since blister and ulceration development. He is currently being treated with Levaquin for bronchitis. He has home health and we will continue. 10/25/17-He is here in follow-up evaluation for bilateral lower extremity ulcerationssubtle he remains on Levaquin for bronchitis. Right lower extremity with no evidence of drainage or ulceration, persistent left lower extremity ulceration. He states that home health has not been out since his appointment. He went to [facility] on Tuesday, studies revealed: RIGHT ABI 0.9, TBI 0.6 LEFT ABI 1.1, TBI 0.6 with triphasic flow bilaterally. We will continue his same treatment plan. He has been educated on compression therapy and need for elevation. He will benefit from lymphedema pumps 11/01/17-He is here in follow-up evaluation for left lower extremity ulcer. The right lower extremity remains healed. He has home health services, but they have not been out to see the patient for 2-3 weeks. He states it home health physical therapy changed his dressing yesterday after therapy; he placed Ace wrap compression. We are still waiting for lymphedema pumps, reordered d/t need for company change. 11/08/17-He is here in follow-up evaluation for left lower extremity ulcer. It is improved. Edema is significantly improved with compression therapy. We will continue with same treatment plan and he will follow-up next week. No word regarding lymphedema pumps 11/15/17-He is here in follow-up evaluation for left lower extremity ulcer. He is healed and will be discharged from wound care services. I have reached out to [facility] regarding his lymphedema pumps. They have been unable to reach the patient; the contact number they had with the patient's wife's cell phone and she has not answered any unrecognized calls. Contact should  be made today, trial planned for next week; [facility] will continue to follow 11/27/17 on evaluation today patient has multiple blistered areas over the right lower extremity his left lower extremity appears to be doing okay. These blistered areas show signs of no infection which is great news. With  that being said he did have some necrotic skin overlying which was mechanically debrided away with saline and gauze today without complication. Overall post debridement the wounds appear to be doing better but in general his swelling seems to be increased. This is obviously not good news. I think this is what has given rise to the blisters. 12/04/17 on evaluation today patient presents for follow-up concerning his bilateral lower extremity edema in the right lower extremity ulcers. He has been tolerating the dressing changes without complication. With that being said he has had no real issues with the wraps which is also good news. Overall I'm pleased with the progress he's been making. 12/11/17 on evaluation today patient appears to be doing rather well in regard to his right lateral lower extremity ulcer. He's been tolerating the dressing changes without complication. Fortunately there does not appear to be any evidence of infection at this time. Overall I'm pleased with the progress that is being made. Unfortunately he has been in the hospital due to having what sounds to be a stomach virus/flu fortunately that is starting to get better. 12/18/17 on evaluation today patient actually appears to be doing very well in regard to his bilateral lower extremities the swelling is under fairly good control his lymphedema pumps are still not up and running quite yet. With that being said he does have several areas of opening noted as far as wounds are concerned mainly over the left lower extremity. With that being said I do believe once he gets lymphedema pumps this would least help you mention some the fluid  and preventing this from occurring. Hopefully that will be set up soon sleeves are Artie in place at his home he just waiting for the machine. 12/25/17 on evaluation today patient actually appears to be doing excellent in fact all of his ulcers appear to have resolved his legs appear very well. I do think he needs compression stockings we have discussed this and they are actually going to go to [facility] today to elastic therapy to get this fitted for him. I think that is definitely a good thing to do. Readmission: 04/09/18 upon evaluation today this patient is seen for readmission due to bilateral lower extremity lymphedema. He has significant swelling of his extremities especially on the left although the right is also swollen he has weeping from both sides. There are no obvious open wounds at this point. Fortunately he has been doing fairly well for quite a bit of time since I last saw him. Nonetheless unfortunately this seems to have reopened and is giving quite a bit of trouble. He states this began about a week ago when he first called [place] to get in to be seen. No fevers, chills, nausea, or vomiting noted at this time. He has not been using his lymphedema pumps due to the fact that they won't fit on his leg at this point likewise is also not been using his compression for essentially the same reason. 04/16/18 upon evaluation today patient actually appears to be doing a little better in regard to the fluid in his bilateral lower extremities. With that being said he's had three falls since I saw him last week. He also states that he's been feeling very poorly. I was concerned last week and feel like that the concern is still there as far as the congestion in his chest is concerned he seems to be breathing about the same as last week but again he states he's very weak he's  not even able to walk further than from the chair to the door. His wife had to buy a wheelchair just to be able to get them out of  the house to get to the appointment today. This has me very concerned. SHARIFF, LASKY ([id number]) 04/23/18 on evaluation today patient actually appears to be doing much better than last week's evaluation. At that time actually had to transport him to the ER via EMS and he subsequently was admitted for acute pulmonary edema, acute renal failure, and acute congestive heart failure. Fortunately he is doing much better. Apparently they did dialyze him and were able to take off roughly 35 pounds of fluid. Nonetheless he is feeling much better both in regard to his breathing and he's able to get around much better at this time compared to previous. Overall I'm very happy with how things are at this time. There does not appear to be any evidence of infection currently. No fevers, chills, nausea, or vomiting noted at this time. 04/30/2018 patient seen today for follow-up and management of bilateral lower extremity lymphedema. He did express being more sad today than usual due to the recent loss of his dog. He states that he has been compliant with using the lymphedema pumps. However he does admit a minute over the last 2-3 days he has not been using the pumps due to the recent loss of his dog. At this time there is no drainage or open wounds to his lower extremities. The left leg edema is measuring smaller today. Still has a significant amount of edema on bilateral lower extremities With dry flaky skin. He will be referred to the lymphedema clinic for further management. Will continue 3 layer compression wraps and follow-up in 1-2 weeks.Denies any pain, fever, chairs recently. No recent falls or injuries reported during this visit. 05/07/18 on evaluation today patient actually appears to be doing very well in regard to his lower extremities in general all things considered. With that being said he is having some pain in the legs just due to the amount of swelling. He does have an area where he had a blister  on the left lateral lower extremity this is open at this point other than that there's nothing else weeping at this time. 05/14/18 on evaluation today patient actually appears to be doing excellent all things considered in regard to his lower extremities. He still has a couple areas of weeping on each leg which has continued to be the issue for him. He does have an appointment with the lymphedema clinic although this isn't until February 2020. That was the earliest they had. In the meantime he has continued to tolerate the compression wraps without complication. 05/28/18 on evaluation today patient actually appears to be doing more poorly in regard to his left lower extremity where he has a wound open at this point. He also had a fall where he subsequently injured his right great toe which has led to an open wound at the site unfortunately. He has been tolerating the dressing changes without complication in general as far as the wraps are concerned that he has not been putting any dressing on the left 1st toe ulcer site. 06/11/18 on evaluation today patient appears to be doing much worse in regard to his bilateral lower extremity ulcers. He has been tolerating the dressing changes without complication although his legs have not been wrapped more recently. Overall I am not very pleased with the way his legs appear. I do believe  he needs to be back in compression wraps he still has not received his compression wraps from the [facility] hospital as of yet. 06/18/18 on evaluation today patient actually appears to be doing significantly better than last time I saw him. He has been tolerating the compression wraps without complication in the circumferential ulcers especially appear to be doing much better. His toe ulcer on the right in regard to the great toe is better although not as good as the legs in my pinion. No fevers chills noted 07/02/18 on evaluation today patient appears to be doing much better in regard to  his lower extremity ulcers. Unfortunately since I last saw him he's had the distal portion of his right great toe if he dated it sounds as if this actually went downhill very quickly. I had only seen him a few days prior and the toe did not appear to be infected at that point subsequently became infected very rapidly and it was decided by the surgeon that the distal portion of the toe needed to be removed. The patient seems to be doing well in this regard he tells me. With that being said his lower extremities are doing better from the standpoint of the wounds although he is significantly swollen at this point. 07/09/18 on evaluation today patient appears to be doing better in regard to the wounds on his lower extremities. In fact everything is almost completely healed he is just a small area on the left posterior lower extremity that is open at this point. 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 With that being said he needs some kind of compression it does sound like he may not be wearing his compression, that is the wraps, during the entire time between when he's here visit to visit. Apparently his wife took the current one off because it began to "fall apart". 07/16/18 on evaluation today patient appears to be extremely swollen especially in regard to his left lower extremity unfortunately. He also has a new skin tear over the left lower extremity and there's a smaller area on the right lower extremity as well. Unfortunately this seems to be due in part to blistering and fluid buildup in his leg. He did get the reduction wraps that were ordered by the [facility] hospital for him to go to lymphedema clinic. With that being said his wounds on the legs have not healed to the point to where they would likely accept them as a  patient lymphedema clinic currently. We need to try to get this to heal. With that being said he's been taking his wraps off which is not doing him any favors at this point. In fact this is probably quite counterproductive compared to what needs to occur. We will likely need to increase to a four layer compression wrap and continue to also utilize elevation and he has to keep the wraps on not take them off as he's been doing currently hasn't had a wrap on since Saturday. 07/23/18 on evaluation today patient appears to be doing much better in regard to his bilateral lower extremities. In fact his left lower extremity which was the largest is actually 15 cm smaller today compared to what it was last time he was here in our clinic. This is obviously good news after just one week. Nonetheless the differences  he actually kept the wraps on during the entire week this time. That's not typical for him. I do believe he understands a little bit better now the severity of the situation and why it's important for him to keep these wraps on. 07/30/18 on evaluation today patient actually appears to be doing rather well in regard to his lower extremities. His legs are much smaller than they have been in the past and he actually has only one very small rudder superficial region remaining that is not closed on the left lateral/posterior lower extremity even this is almost completely close. I do believe likely next week he will be healed without any complications. I do think we need to continue the wraps however this seems to be beneficial for him. I also think it may be a good time for [place] to go ahead and see about getting the appointment with the lymphedema clinic which is supposed to be made for him in order to keep this moving along and hopefully get them into compression wraps that will in the end help him to remain healed. 08/06/18 on evaluation today patient actually appears to be doing very well in regard as  bilateral lower extremities. In fact his wounds appear to be completely healed at this time. 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 He is in the process of getting appointment with the lymphedema clinic we have made this referral were just waiting to hear back on the schedule time. We need to follow up on that today as well. 08/13/18 on evaluation today patient actually appears to be doing very well in regard to his bilateral lower extremities there are no open wounds at this point. We are gonna go ahead and see about ordering the Velcro compression wraps for him had a discussion with them about [place] doing it versus the [place] they feel like they can definitely afford going ahead and get the wraps themselves and they would prefer to try to avoid having to go to the lymphedema clinic if it all possible which I completely understand. As long as he has good compression I'm okay either way. Adam Merritt, Adam Merritt ([id number]) 3/17//20 on evaluation today patient actually appears to be doing well in regard to his bilateral lower extremity ulcers. He has been tolerating the dressing changes without complication specifically the compression wraps. Overall is had no issues my fingers finding that I see at this point is that he is having trouble with constipation. He tells me he has not been able to go to the bathroom for about six days. He's taken to over-the- counter oral laxatives unfortunately this is not helping. He has not contacted his doctor. 08/27/18 on evaluation today patient appears to be doing fairly well in regard to his lower extremities at this point. There does not appear to be any new altars is swelling is very well controlled. We are still waiting for his Velcro compression wraps to arrive that should be sometime in the next week is Artie sent the check for these. 09/03/18 on evaluation today patient appears to be doing excellent in regard to  his bilateral lower extremities which he shows no signs of wound openings in regard to this point. He does have his Velcro compression wraps which did arrive in the mail since I saw him last week. Overall he is doing excellent in my pinion. 09/13/18 on evaluation today patient appears to be doing very well currently in regard to the  overall appearance of his bilateral lower extremities although he's a little bit more swollen than last time we saw him. At that point he been discharged without any open wounds. Nonetheless he has a small open wound on the posterior left lower extremity with some evidence of cellulitis noted as well. Fortunately I feel like he has made good progress overall with regard to his lower extremities from were things used to be. 09/20/18 on evaluation today patient actually appears to be doing much better. The erythematous lower extremity is improving wound itself which is still open appears to be doing much better as far as but appearance as well as pain is concerned overall very pleased in this regard. There's no signs of active infection at this time. 09/27/18 on evaluation today patient's wounds on the lower extremity actually appear to be doing fairly well at this time which is good news. There is no evidence of active infection currently and again is just as left lower extremity were there any wounds at this point anyway. I believe they may be completely healed but again I'm not 100% sure based on evaluation today. I think one more week of observation would likely be a good idea. 10/04/18 on evaluation today patient actually appears to be doing excellent in regard to the left lower extremity which actually appears to be completely healed as of today. Unfortunately he's been having issues with his right lower extremity have a new wound that has opened. Fortunately there's no evidence of active infection at this time which is good news. 10/10/18 upon evaluation today patient  actually appears to be doing a little bit worse with the new open area on his right posterior lower extremity. He's been tolerating the dressing changes without complication. Right now we been using his compression wraps although I think we may need to switch back to actually performing bilateral compression wraps are in the clinic. No fevers, chills, nausea, or vomiting noted at this time. 10/17/18 on evaluation today patient actually appears to be doing quite well in regard to his bilateral lower extremity ulcers. He has been tolerating the reps without complication although he would prefer not to rewrap his legs as of today. Fortunately there's no signs of active infection which is good news. No fevers, chills, nausea, or vomiting noted at this time. 10/24/18 on evaluation today patient appears to be doing very well in regard to his lower extremities. His right lower extremity is shown signs of healing and his left lower [place] though not healed appears to be improving which is excellent news. Overall very pleased with how things seem to be progressing at this time. The patient is likewise happy to hear this. His Velcro compression wraps however have not been put on properly will gonna show his wife how to do that properly today. 10/31/18 on evaluation today patient appears to be doing more poorly in regard to his left lower extremity in particular although both lower extremities actually are showing some signs of being worse than my previous evaluation. Unfortunately I'm just not sure that his compression stockings even with the use of the compression/lymphedema pumps seem to be controlling this well. Upon further questioning he tells me that he also is not able to lie flat in the bed due to his congestive heart failure and difficulty breathing. For that reason he sleeps in his recliner. To make matters worse his recliner also cannot even hold his legs up so instead of even being somewhat elevated  they pretty  much hang down to the floor. This is the way he sleeps each night which is definitely counterproductive to everything else that were attempting to do from the standpoint of controlling his fluid. Nonetheless I think that he potentially could benefit from a hospital bed although this would be something that his primary care provider would likely have to order since anything that is order on our side has to be directly related to wound care and again the hospital bed is not necessarily a direct relation to although I think it does contribute to his overall wound status on his lower extremities. 11/07/18 on evaluation today patient appears to be doing better in regard to his bilateral lower extremity. He's been tolerating the dressing changes without complication. We did in the interim since I last saw him switch to just using extras orbiting new alginate and that seems to have done much better for him. I'm very pleased with the overall progress that is made. 11/14/18 on evaluation today patient appears to be redoing rather well in regard to his left lower extremity ulcers which are the only ones remaining at this point. Fortunately there's no signs of active infection at this time which is good news. Overall been very pleased with how things seem to be progressing currently. No fevers, chills, nausea, or vomiting noted at this time. 11/20/17 on evaluation today patient actually appears to be doing quite well in regard to his lower extremities on the left on the right he has several blisters that showed up although there's some question about whether or not he has had his broker compression wraps on like he was supposed to or not. Fortunately there's no signs of active infection at this time which is good news. Unfortunately though he is doing well from the standpoint of the left leg the right leg is not doing as well again this is when we did not wrap last week. 11/28/18 on evaluation today  patient appears to be doing well in regard to his bilateral lower extremities is left appears to be healed is right is not healed but is very close to being so. Overall very pleased with how things seem to be progressing. Patient is likewise happy that things are doing well. 12/09/18 on evaluation today patient actually appears to be completely healed which is excellent news. He actually seems to be doing well in regard to the swelling of the bilateral lower extremities which is also great news. Overall very pleased with how things seem to be progressing. Readmission: 03/18/2019 upon evaluation today patient presents for reevaluation concerning issues that he is having with his left lower extremity where he does have a wound noted upon inspection today. He also has a wound on the right second toe on the tip where it is apparently been rubbing in his shoe I did look at issues and you can actually see where this has been occurring as well. Fortunately there is no signs of infection with regard to Adam Merritt, Adam Merritt. ([id number]) the toe necessarily although it is very swollen compared to normal that does have me somewhat concerned about the possibility of further evaluation for infection/osteomyelitis. I would recommend an x-ray to start with. Otherwise he states that it is been 1-2 weeks that he has had the draining in regard to left lower extremity he does not know how this happened he has been wearing his compression appropriately and I do see that as well today but nonetheless I do think that he needs to continue  to be very cautious with regard to elevation as well. 03/25/2019 on evaluation today patient appears to be doing much better in regard to his lower extremity at this time. That is on the left. He did culture positive for Pseudomonas but the good news is he seems to be doing much better the gentamicin we applied topically seems to have done a great job for him. There is no signs of active  infection at this time systemically and locally he is doing much better. No fevers, chills, nausea, vomiting, or diarrhea. In regard to his right toe this seems to be doing much better there is very little pressure noted at this point I did clean up some of the callus today around the edges of the wound as well as the surface of the wound but to be honest he is progressing quite nicely. 10/30; the area on the left anterior lower tibia area is healed over. His edema control is adequate even though his use of the compression pumps seems very intermittent. He has a juxta lite stocking on the right leg and a think there is 1 for the left leg at home. His wife is putting these on. He has an area on the plantar right second toe which is a hammertoe they are trying to offload this 04/08/2019 on evaluation today patient appears to be doing well in regard to his lower extremities which is good news. We are seeing him today for his toe ulcer which is giving him a little bit more trouble but again still seems to be doing better at this point which is good news. He is going require some sharp debridement in regard to the toe today however. 04/15/2019 on evaluation today patient actually appears to be doing quite well with regard to his toe ulcer. I am very pleased with how things are doing in that regard. With regard to his lower extremity edema in general he tells me he is not been using the lymphedema pumps regularly although he does not use them. I think that he needs to use them more regularly he does have a small blister on the left lower extremity this is not open at this point but obviously it something that can get worse if he does not keep the compression going and the pumps going as well. 04/22/2019 on evaluation today patient appears to be doing well with regard to his toe ulcer. He has been tolerating the dressing changes without complication. This is measuring slightly smaller this week as compared to  last week. Fortunately there is no signs of active infection at this time. 05/06/2019 on evaluation today patient actually appears to be doing excellent. In fact his toe appears to be completely healed which is great news. There is no signs of active infection at this time. Readmission: Patient presents today for follow-up after having been discharged at the beginning of December 2020. He states that in recent weeks things have reopened and has been having more trouble at this point. With that being said fortunately there is no signs of active infection at this time. No fever chills noted. Nonetheless he also does have an issue with one of his toes as well that seems to be somewhat this is on the right foot second toe. 07/25/2019 upon evaluation today patient's lower extremities appear to be doing excellent bilaterally. I really feel like he is showing signs of great improvement and in fact I think he is close to healing which is great news. There  is no signs of active infection at this time. No fevers, chills, nausea, vomiting, or diarrhea. 07/31/2019 upon evaluation today patient appears to be doing excellent and in fact I think may be completely healed in regard to his right lower extremity that were still to monitor this 1 more week before closing it out. The left lower extremity is measuring smaller although not completely closed seems to be doing excellent. 08/07/2019 upon evaluation today patient appears to be doing excellent in regard to his wounds. In fact the right lower extremity is completely healed there is no issues here. The left lower extremity has 1 very small area still remaining fortunately there is no signs of infection and overall I think he is very close to closure of the here as well. 08/14/19 upon evaluation today patient appears to be doing excellent in regard to his lower extremities. In fact he appears to be completely healed as of today. Fortunately there's no signs of active  infection at this time. No fevers, chills, nausea, or vomiting noted at this time. READMISSION 09/26/2019 This is a 75 year old man who is well-known to this clinic having just been discharged on 08/14/2019. He has known lymphedema and chronic venous insufficiency. He has Farrow wrap stockings and also external compression pumps. He does not use the external compression pumps however. He does however apparently fairly faithfully use the stockings. They have not been lotion in his legs. He has developed new wounds on the right anterior as well as the left medial left lateral and left posterior calf. He returns for our review of this Past medical history includes coronary artery disease, hypertension, congestive heart failure, COPD, venous insufficiency with lymphedema, type 2 diabetes. He apparently has compression pumps, Farrow wraps and at 1 point a hospital bed although I believe he sleeps in a recliner 10/03/2019 upon evaluation today patient appears to be doing better with regard to his wounds in general. He has been tolerating the dressing changes without complication. Fortunately there is no signs of active infection. No fevers, chills, nausea, vomiting, or diarrhea. I do believe the compression wraps are helping as they always have in the past. 10/10/2019 upon evaluation today patient appears to be doing excellent at this point. His right lower extremity is measuring much better and in fact is completely healed. His left lower extremity is also measuring better though not healed seems to be doing excellent at this point which is great news. Overall I am very pleased with how things appear currently. 10/27/2019 upon evaluation today patient appears to be doing quite well with regard to his wounds currently. He has been tolerating the dressing changes without complication. Fortunately there is no signs of active infection at this time. In fact he is almost completely healed on the left and has just a  very small area open and on the right he is completely healed. 11/04/2019 upon evaluation today patient appears to be doing quite well with regard to his wounds. In fact he appears to be quite possibly completely healed on the left lower extremity now as well the right lower extremity is still doing well. With that being said unfortunately though this may be healed I think it is a very fragile area and I cannot even confirm there is not a very small opening still remaining. I really think he may benefit from 1 additional weeks wrapping before discontinuing. Adam Merritt, Adam Merritt ([id number]) 11/10/2019 upon evaluation today patient appears to be doing well in regard to his original wounds  in fact everything that we were treating last week is completely healed on the left. Unfortunately he has a new skin tear just above where the wrap slipped down due to a fall he sustained causing this injury. 11/17/2019 on evaluation today patient appears to be doing better with regard to his wound. He has been tolerating the dressing changes without complication. Fortunately there is no signs of active infection at this time. No fevers, chills, nausea, vomiting, or diarrhea. 11/24/2019 upon evaluation today patient appears to be doing well with regard to his wound. This is making good progress there does not appear to be signs of active infection but overall I do feel like he is headed in the correct direction. Overall he is tolerating the compression wrap as well. 12/02/2019 upon evaluation today patient appears to be doing well with regard to his leg ulcer. In fact this appears to be completely healed and this is the last of the wounds that he had but still the left anterior lower extremity. Fortunately there is no signs of active infection at this time. No fevers, chills, nausea, vomiting, or diarrhea. Readmission: 02/05/2020 on evaluation today patient appears to be doing well with regard to his wound all things considered.  He actually tells me that a couple weeks ago he had trouble with his wraps and therefore took him off for a couple of days in order to give his legs a break. It was during this time that he actually developed increased swelling and edema of his legs and blisters on both legs. That is when he called to make the appointment. Nonetheless since that time he began wearing his compression wraps which are Velcro in the meantime and has done extremely well with this. In fact his leg appears to be under good control as far as edema is concerned today it is nothing like what I am seeing when he has come in for evaluations in the past. They have been using some of the silver alginate at home which has been beneficial for him. Fortunately there is no signs of active infection at this time. No fevers, chills, nausea, vomiting, or diarrhea. 02/19/2020 on evaluation today patient unfortunately does not appear to be doing quite as well as I would like to see. He has no signs of active infection at this time but unfortunately he is not doing well in regard to the overall appearance of his left leg. He has a couple other areas that are weeping now and the leg is much more swollen. I think that he needs to actually have a compression wrap to try to manage this. 02/26/2020 on evaluation today patient appears to be doing well with regard to his left lower extremity ulcer. Fortunately the swelling is down I do believe the pressure wrap seems to be doing quite well. Fortunately there is no signs of active infection at this time. 03/05/2020 upon evaluation today patient appears to be doing excellent in regard to his legs at this point. Fortunately there is no signs of active infection which is great news. Overall he feels like he is completely healed which is great news. Readmission: 05/20/2020 upon evaluation today patient appears to be doing well with regard to his leg ulcer all things considered. He is actually coming back  to see [place] after having had a reopening of the ulcers on his left leg in the past several weeks. He is out of dressing supplies and his wife is been try to take care of this  as best she can. 06/10/2020 upon evaluation today patient unfortunately appears to be doing significantly worse today compared to when I last saw him. He and his wife both tell me that "there has been a lot going on". I did not actually go into detail about everything that has been happening but nonetheless he has not obviously been wearing his compression. He brings them with him today but they were not on. I am not even certain to be honest that he could get those on at this point. Nonetheless I do believe that he is going to require compression wrapping at some point shortly but right now we need to try to get what appears to be infection under control at this time. 06/28/2020 upon evaluation today patient appears to be doing well with regard to 06/28/2020 upon evaluation today patient appears to be doing well pretty well in regard to his left leg which is much better than previous. With that being said in regard to his right leg he does seem to be having some issues with a new wound after he sustained a fall. This fortunately is not too deep but does appear to be something we need to address. Neither deep which is good. 07/05/2020 on evaluation today patient appears to be doing well with regard to his wounds. In fact everything on the left leg appears to be pretty much healed on the right leg this is very close though not completely closed yet. Fortunately there is no evidence of active infection at this time. No fevers, chills, nausea, vomiting, or diarrhea. 07/13/2019 upon evaluation today patient appears to be making good progress which is great news. Overall I am extremely pleased with where things stand today. There does not appear to be any signs of active infection which is great news and overall his left leg appears healed  still the right leg though not healed does seem to be making good progress which is excellent news. He is having no pain. 07/19/2020 on evaluation today patient appears to be doing well with regard to his wound. He has been tolerating the dressing changes without complication. Fortunately there does not appear to be any signs of active infection at this time. No fevers, chills, nausea, vomiting, or diarrhea. Readmission: 08/20/2020 on evaluation today patient presents for reevaluation here in the clinic concerning issues that he has been having with his bilateral lower extremities. Unfortunately this is an ongoing reoccurring issue. He tells me that he is really not certain exactly what caused the issue although as I discussed this further with him it appears that he has been sitting for the most part and sleeping in his chair with his feet on the ground he is not really elevating at all in fact the chair does not even have a reclining function therefore it is basically just him sitting with his feet on the ground. I think this alone has probably led to his increased swelling he is also having more difficulty walking which means that he is pumping less blood flow through his legs anyway as far as the venous stasis is concerned. All in all I think this is leading to a downward spiral of him not being able to walk very well as his legs are so heavy, they begin to weep, and overall he just does not seem to be doing as well as it was when I last saw him. 08/27/2020 upon evaluation today patient's legs actually are showing signs of improvement which is great news. There  does not appear to be any evidence of infection which is also excellent news. I am extremely pleased with where things stand today. No fevers, chills, nausea, vomiting, or diarrhea. 09/03/20 upon evaluation today patient appears to be doing excellent in regard to his wounds currently. He has been tolerating the dressing changes without  complication and overall I am extremely pleased with where things stand today. No fevers, chills, nausea, vomiting, or diarrhea. Overall he is healed on the right leg although the left leg not being completely healed still is doing significantly better. Adam Merritt, Adam Merritt ([id number]) 09/10/2020/8/22 upon evaluation today patient appears to be doing well with regard to his wounds. He has been tolerating the dressing changes without complication. Fortunately he appears to be completely healed. I am very pleased in that regard. As is the patient his right leg is also doing well he did get a new recliner which is helping him keep his feet off the ground which I think is helped he is down 2 cm on the left compared to last week so I think there is some definite improvement here. Readmission 12/15/20: Mr. Torri Langston is a 75 year old male with a past medical history of lymphedema, insulin-dependent type 2 diabetes, COPD and congestive heart failure that presents to the clinic for increased drainage from his legs bilaterally, Left greater than right. Patient has lymphedema. He has been seen in our clinic on multiple occasions for this issue. He currently denies signs of infection. He has not been using his lymphedema pumps. 12/23/2020 upon evaluation today patient appears to be doing well with regard to his wounds. He has been tolerating the dressing changes he really does not have any open wounds just weeping areas of the legs bilaterally and fortunately seems to be doing better at this point. 12/28/2020 upon evaluation today patient's wound is actually showing signs of good improvement. There does not appear to be any evidence of infection which is great news and overall I am extremely pleased at this point. I think is very close to complete resolution. 01/04/2021 upon evaluation today patient actually appears to be completely healed which is great news. I do not see any signs of active infection currently which is  also great news. In general I am extremely pleased with where we stand and I do believe that the patient is tolerating the dressing changes without complication. I believe he is ready to go back into his Velcro compression and I did see that they brought them with him today. Readmission: Patient presents today for follow-up he was last seen August 2. He is having some issues with some mild drainage and leaking from the left leg. This is something that has reopened and again is a recurrent issue for him. Subsequently he does wear his Velcro compression wraps though I think we may want to look into something a little bit better for him I am thinking that we may want to try the juxta fit compression which I think would do much more effective compression treatment overall for him. Patient History Information obtained from Patient. Allergies Corticosteroids (Glucocorticoids) Family History Cancer - Paternal Grandparents, Kidney Disease - Siblings, Lung Disease - Mother, No family history of Diabetes, Heart Disease, Hereditary Spherocytosis, Hypertension, Seizures, Stroke, Thyroid Problems, Tuberculosis. Social History Never smoker, Marital Status - Married, Alcohol Use - Never, Drug Use - No History, Caffeine Use - Never. Medical History Eyes Denies history of Cataracts, Glaucoma, Optic Neuritis Ear/Nose/Mouth/Throat Denies history of Chronic sinus problems/congestion, Middle ear problems  Hematologic/Lymphatic Patient has history of Anemia - aplastic anemia, Lymphedema Denies history of Hemophilia, Human Immunodeficiency Virus, Sickle Cell Disease Respiratory Patient has history of Chronic Obstructive Pulmonary Disease (COPD), Sleep Apnea Denies history of Aspiration, Asthma, Pneumothorax, Tuberculosis Cardiovascular Patient has history of Congestive Heart Failure, Coronary Artery Disease, Hypertension, Peripheral Venous Disease Denies history of Angina, Arrhythmia, Deep Vein Thrombosis,  Hypotension, Myocardial Infarction, Peripheral Arterial Disease, Phlebitis, Vasculitis Gastrointestinal Denies history of Cirrhosis , Colitis, Crohn s, Hepatitis A, Hepatitis B, Hepatitis C Endocrine Patient has history of Type II Diabetes Denies history of Type I Diabetes Genitourinary Denies history of End Stage Renal Disease Immunological Denies history of Lupus Erythematosus, Raynaud s, Scleroderma Integumentary (Skin) Denies history of History of Burn, History of pressure wounds Musculoskeletal Patient has history of Osteoarthritis Denies history of Gout, Rheumatoid Arthritis, Osteomyelitis Neurologic Patient has history of Neuropathy Denies history of Dementia, Quadriplegia, Paraplegia, Seizure Disorder Oncologic Patient has history of Received Chemotherapy - last dose 2006 Denies history of Received Radiation Psychiatric RAJAN, BURGARD ([id number]) Denies history of Anorexia/bulimia, Confinement Anxiety Hospitalization/Surgery History - bronchitis. Medical And Surgical History Notes Respiratory home O2 at night as needed Cardiovascular varicose veins Neurologic CVA - 1992 Objective Constitutional patient is hypertensive.. pulse regular and within target range for patient.Marland Kitchen respirations regular, non-labored and within target range for patient.Marland Kitchen temperature within target range for patient.. Obese and well-hydrated in no acute distress. Vitals Time Taken: 10:27 AM, Height: 66 in, Source: Stated, Weight: 300 lbs, Source: Measured, BMI: 48.4, Temperature: 98.1 F, Pulse: 75 bpm, Respiratory Rate: 18 breaths/min, Blood Pressure: 159/78 mmHg. Eyes conjunctiva clear no eyelid edema noted. pupils equal round and reactive to light and accommodation. Ears, Nose, Mouth, and Throat no gross abnormality of ear auricles or external auditory canals. normal hearing noted during conversation. mucus membranes moist. Respiratory normal breathing without difficulty. Cardiovascular 2+  dorsalis pedis/posterior tibialis pulses. 1+ pitting edema of the bilateral lower extremities. Musculoskeletal Patient unable to walk without assistance. no significant deformity or arthritic changes, no loss or range of motion, no clubbing. Psychiatric this patient is able to make decisions and demonstrates good insight into disease process. Alert and Oriented x 3. pleasant and cooperative. General Notes: Upon evaluation today patient did not have terrible wounds noted although did have one on the right side. He also had one on the left side as well. On the left this was much more open and draining based on what we are seeing currently. Fortunately I do not see any evidence of active infection systemically which is great news and overall I am very pleased in that regard. Nonetheless I do think he probably needs compression wraps to try to get the edema under control I think we need may be a better long-term option for Velcro compression as well. Integumentary (Hair, Skin) Wound #[id number] status is Open. Original cause of wound was Gradually Appeared. The date acquired was: 03/05/2021. The wound is located on the Right,Circumferential Lower Leg. The wound measures 12cm length x 54cm width x 0.1cm depth; 508.938cm^2 area and 50.894cm^3 volume. There is Fat Layer (Subcutaneous Tissue) exposed. There is no tunneling or undermining noted. There is a medium amount of serosanguineous drainage noted. There is medium (34-66%) red granulation within the wound bed. There is a medium (34-66%) amount of necrotic tissue within the wound bed including Adherent Slough. Wound #42 status is Open. Original cause of wound was Gradually Appeared. The date acquired was: 03/05/2021. The wound is located on the Right,Medial Lower Leg. The wound  measures 0.5cm length x 0.5cm width x 0.1cm depth; 0.196cm^2 area and 0.02cm^3 volume. There is Fat Layer (Subcutaneous Tissue) exposed. There is no tunneling or undermining noted. There  is a medium amount of serosanguineous drainage noted. There is medium (34-66%) red granulation within the wound bed. There is a medium (34-66%) amount of necrotic tissue within the wound bed including Adherent Slough. Assessment Active Problems ICD-10 Lymphedema, not elsewhere classified SAMANYU, TINNELL. ([id number]) Venous insufficiency (chronic) (peripheral) Non-pressure chronic ulcer of other part of right lower leg with fat layer exposed Non-pressure chronic ulcer of other part of left lower leg with fat layer exposed Procedures Wound #41 Pre-procedure diagnosis of Wound #41 is a Lymphedema located on the Right,Circumferential Lower Leg . There was a Chemical/Enzymatic/Mechanical debridement performed by Tommie Sams., PA-C. With the following instrument(s): saline gauze to remove Viable and Non-Viable tissue/material. Material removed includes Subcutaneous Tissue, Slough, Skin: Dermis, and Skin: Epidermis. Other agent used was saline and gauze. A time out was conducted at 11:05, prior to the start of the procedure. A Minimum amount of bleeding was controlled with Pressure. The procedure was tolerated well with a pain level of 0 throughout and a pain level of 0 following the procedure. Post Debridement Measurements: 12cm length x 54cm width x 0.1cm depth; 50.894cm^3 volume. Character of Wound/Ulcer Post Debridement is improved. Post procedure Diagnosis Wound #41: Same as Pre-Procedure Pre-procedure diagnosis of Wound #41 is a Lymphedema located on the Right,Circumferential Lower Leg . There was a Four Layer Compression Therapy Procedure by Carlene Coria, RN. Post procedure Diagnosis Wound #41: Same as Pre-Procedure Wound #42 Pre-procedure diagnosis of Wound #42 is a Venous Leg Ulcer located on the Right,Medial Lower Leg .Severity of Tissue Pre Debridement is: Fat layer exposed. There was a Excisional Skin/Subcutaneous Tissue Debridement with a total area of 0.25 sq cm performed by Tommie Sams., PA-C. With the following instrument(s): Curette to remove Viable and Non-Viable tissue/material. Material removed includes Subcutaneous Tissue, Slough, Skin: Dermis, and Skin: Epidermis. No specimens were taken. A time out was conducted at 11:05, prior to the start of the procedure. A Minimum amount of bleeding was controlled with Pressure. The procedure was tolerated well with a pain level of 0 throughout and a pain level of 0 following the procedure. Post Debridement Measurements: 0.5cm length x 0.5cm width x 0.1cm depth; 0.02cm^3 volume. Character of Wound/Ulcer Post Debridement is improved. Severity of Tissue Post Debridement is: Fat layer exposed. Post procedure Diagnosis Wound #42: Same as Pre-Procedure Plan Follow-up Appointments: Return Appointment in 1 week. Bathing/ Shower/ Hygiene: May shower with wound dressing protected with water repellent cover or cast protector. Edema Control - Lymphedema / Segmental Compressive Device / Other: Elevate, Exercise Daily and Avoid Standing for Long Periods of Time. Elevate legs to the level of the heart and pump ankles as often as possible Elevate leg(s) parallel to the floor when sitting. Off-Loading: Turn and reposition every 2 hours WOUND #41: - Lower Leg Wound Laterality: Right, Circumferential Primary Dressing: Silvercel 4 1/4x 4 1/4 (in/in) 1 x Per Week/30 Days Discharge Instructions: Apply Silvercel 4 1/4x 4 1/4 (in/in) as instructed Secondary Dressing: ABD Pad 5x9 (in/in) 1 x Per Week/30 Days Discharge Instructions: Cover with ABD pad Compression Wrap: Medichoice 4 layer Compression System, 35-40 mmHG 1 x Per Week/30 Days Discharge Instructions: Apply multi-layer wrap as directed. WOUND #42: - Lower Leg Wound Laterality: Right, Medial Primary Dressing: Silvercel 4 1/4x 4 1/4 (in/in) 1 x Per Week/30 Days Discharge Instructions:  Apply Silvercel 4 1/4x 4 1/4 (in/in) as instructed Secondary Dressing: ABD Pad 5x9 (in/in) 1 x Per  Week/30 Days Discharge Instructions: Cover with ABD pad Compression Wrap: Medichoice 4 layer Compression System, 35-40 mmHG 1 x Per Week/30 Days Discharge Instructions: Apply multi-layer wrap as directed. 1. I would recommend currently that we go ahead and initiate treatment with a silver alginate dressing to open wound locations as well as utilizing a ABD pad to cover. 2. We will also get a go ahead and initiate a 4-layer compression wrap for the patient. 3. I am also can recommend that we go ahead and order him juxta fit compression wraps for the leg as well as the foot when he comes back for his nurse visit and we can get measurements that are more appropriate at that time. I am hopeful that we will be able to get this ordered and Adam Merritt, Adam Merritt ([id number]) available for him so he will have new wrap since his recount of old and who also have ones that hopefully will hold the compression and a little better hopefully preventing him from having ongoing and more frequent issues with reopening. We will see patient back for reevaluation in 1 week here in the clinic. If anything worsens or changes patient will contact our office for additional recommendations. Electronic Signature(s) Signed: 03/25/2021 12:58:13 PM By: Worthy Keeler PA-C Previous Signature: 03/25/2021 12:01:33 PM Version By: Worthy Keeler PA-C Entered By: Worthy Keeler on 03/25/2021 12:58:13 Adam Merritt ([id number]) -------------------------------------------------------------------------------- ROS/PFSH Details Patient Name: Adam Merritt Date of Service: 03/25/2021 10:15 AM Medical Record Number: [id number] Patient Account Number: [id number] Date of Birth/Sex: 1945-12-03 (75 y.o. M) Treating RN: Carlene Coria Primary Care Provider: [name] Other Clinician: Referring Provider: Referral, Self Treating Provider/Extender: Skipper Cliche in Treatment: 0 Information Obtained From Patient Eyes Medical  History: Negative for: Cataracts; Glaucoma; Optic Neuritis Ear/Nose/Mouth/Throat Medical History: Negative for: Chronic sinus problems/congestion; Middle ear problems Hematologic/Lymphatic Medical History: Positive for: Anemia - aplastic anemia; Lymphedema Negative for: Hemophilia; Human Immunodeficiency Virus; Sickle Cell Disease Respiratory Medical History: Positive for: Chronic Obstructive Pulmonary Disease (COPD); Sleep Apnea Negative for: Aspiration; Asthma; Pneumothorax; Tuberculosis Past Medical History Notes: home O2 at night as needed Cardiovascular Medical History: Positive for: Congestive Heart Failure; Coronary Artery Disease; Hypertension; Peripheral Venous Disease Negative for: Angina; Arrhythmia; Deep Vein Thrombosis; Hypotension; Myocardial Infarction; Peripheral Arterial Disease; Phlebitis; Vasculitis Past Medical History Notes: varicose veins Gastrointestinal Medical History: Negative for: Cirrhosis ; Colitis; Crohnos; Hepatitis A; Hepatitis B; Hepatitis C Endocrine Medical History: Positive for: Type II Diabetes Negative for: Type I Diabetes Time with diabetes: since 2006 Treated with: Insulin Blood sugar tested every day: Yes Tested : Blood sugar testing results: Breakfast: 135 Genitourinary Medical History: Negative for: End Stage Renal Disease Immunological QUAYSHAUN, HUBBERT ([id number]) Medical History: Negative for: Lupus Erythematosus; Raynaudos; Scleroderma Integumentary (Skin) Medical History: Negative for: History of Burn; History of pressure wounds Musculoskeletal Medical History: Positive for: Osteoarthritis Negative for: Gout; Rheumatoid Arthritis; Osteomyelitis Neurologic Medical History: Positive for: Neuropathy Negative for: Dementia; Quadriplegia; Paraplegia; Seizure Disorder Past Medical History Notes: CVA - 1992 Oncologic Medical History: Positive for: Received Chemotherapy - last dose 2006 Negative for: Received  Radiation Psychiatric Medical History: Negative for: Anorexia/bulimia; Confinement Anxiety Immunizations Pneumococcal Vaccine: Received Pneumococcal Vaccination: Yes Received Pneumococcal Vaccination On or After 60th Birthday: No Implantable Devices None Hospitalization / Surgery History Type of Hospitalization/Surgery bronchitis Family and Social History Cancer: Yes - Paternal Grandparents; Diabetes: No; Heart Disease:  No; Hereditary Spherocytosis: No; Hypertension: No; Kidney Disease: Yes - Siblings; Lung Disease: Yes - Mother; Seizures: No; Stroke: No; Thyroid Problems: No; Tuberculosis: No; Never smoker; Marital Status - Married; Alcohol Use: Never; Drug Use: No History; Caffeine Use: Never; Financial Concerns: No; Food, Clothing or Shelter Needs: No; Support System Lacking: No; Transportation Concerns: No Electronic Signature(s) Signed: 03/25/2021 4:19:44 PM By: Carlene Coria RN Signed: 03/25/2021 4:28:58 PM By: Worthy Keeler PA-C Entered By: Carlene Coria on 03/25/2021 10:28:58 AXL, RODINO ([id number]) -------------------------------------------------------------------------------- SuperBill Details Patient Name: Adam Merritt Date of Service: 03/25/2021 Medical Record Number: [id number] Patient Account Number: [id number] Date of Birth/Sex: [birth year] (75 y.o. M) Treating RN: Carlene Coria Primary Care Provider: [name] Other Clinician: Referring Provider: Referral, Self Treating Provider/Extender: Skipper Cliche in Treatment: 0 Diagnosis Coding ICD-10 Codes Code Description I89.0 Lymphedema, not elsewhere classified I87.2 Venous insufficiency (chronic) (peripheral) L97.812 Non-pressure chronic ulcer of other part of right lower leg with fat layer exposed L97.822 Non-pressure chronic ulcer of other part of left lower leg with fat layer exposed Facility Procedures CPT4 Code: 62563893 Description: [place] VISIT-LEV 3 EST PT Modifier: Quantity:  1 CPT4 Code: 73428768 Description: 11572 - DEB SUBQ TISSUE 20 SQ CM/< Modifier: Quantity: 1 CPT4 Code: Description: ICD-10 Diagnosis Description I20.355 Non-pressure chronic ulcer of other part of right lower leg with fat laye Modifier: r exposed Quantity: Physician Procedures CPT4 Code: 9741638 Description: 45364 - WC PHYS LEVEL 4 - EST PT Modifier: 25 Quantity: 1 CPT4 Code: Description: ICD-10 Diagnosis Description I89.0 Lymphedema, not elsewhere classified I87.2 Venous insufficiency (chronic) (peripheral) L97.812 Non-pressure chronic ulcer of other part of right lower leg with fat lay L97.822 Non-pressure chronic ulcer of  other part of left lower leg with fat laye Modifier: er exposed r exposed Quantity: CPT4 Code: 6803212 Description: 11042 - WC PHYS SUBQ TISS 20 SQ CM Modifier: Quantity: 1 CPT4 Code: Description: ICD-10 Diagnosis Description Y48.250 Non-pressure chronic ulcer of other part of right lower leg with fat lay Modifier: er exposed Quantity: Electronic Signature(s) Signed: 03/25/2021 3:58:50 PM By: Worthy Keeler PA-C Previous Signature: 03/25/2021 12:58:29 PM Version By: Worthy Keeler PA-C Previous Signature: 03/25/2021 12:57:13 PM Version By: Worthy Keeler PA-C Previous Signature: 03/25/2021 11:46:50 AM Version By: Carlene Coria RN Entered By: Worthy Keeler on 03/25/2021 15:58:49

## 2021-04-01 ENCOUNTER — Other Ambulatory Visit: Payer: Self-pay

## 2021-04-01 DIAGNOSIS — L97822 Non-pressure chronic ulcer of other part of left lower leg with fat layer exposed: Secondary | ICD-10-CM | POA: Diagnosis not present

## 2021-04-01 DIAGNOSIS — I872 Venous insufficiency (chronic) (peripheral): Secondary | ICD-10-CM | POA: Diagnosis not present

## 2021-04-01 DIAGNOSIS — E11622 Type 2 diabetes mellitus with other skin ulcer: Secondary | ICD-10-CM | POA: Diagnosis not present

## 2021-04-01 DIAGNOSIS — L97812 Non-pressure chronic ulcer of other part of right lower leg with fat layer exposed: Secondary | ICD-10-CM | POA: Diagnosis not present

## 2021-04-01 DIAGNOSIS — I890 Lymphedema, not elsewhere classified: Secondary | ICD-10-CM | POA: Diagnosis not present

## 2021-04-04 NOTE — Progress Notes (Signed)
LEMAN, MARTINEK ([id number]) Visit Report for 04/01/2021 Arrival Information Details Patient Name: Adam Merritt, Adam Merritt Date of Service: 04/01/2021 11:30 AM Medical Record Number: [id number] Patient Account Number: [id number] Date of Birth/Sex: 02/10/1946 (75 y.o. M) Treating RN: Cornell Barman Primary Care Ingris Pasquarella: [name] Other Clinician: Referring Asianna Brundage: [name] Treating Julianah Marciel/Extender: Skipper Cliche in Treatment: 1 Visit Information History Since Last Visit Has Compression in Place as Prescribed: Yes Patient Arrived: Wheel Chair Pain Present Now: No Arrival Time: 11:26 Accompanied By: wife Transfer Assistance: None Patient Identification Verified: Yes Secondary Verification Process Completed: Yes Patient Requires Transmission-Based Precautions: No Patient Has Alerts: No Electronic Signature(s) Signed: 04/04/2021 3:17:27 PM By: Gretta Cool, BSN, RN, CWS, Kim RN, BSN Entered By: Gretta Cool, BSN, RN, CWS, Kim on 04/01/2021 11:56:45 Adam Merritt ([id number]) -------------------------------------------------------------------------------- Clinic Level of Care Assessment Details Patient Name: Adam Merritt Date of Service: 04/01/2021 11:30 AM Medical Record Number: [id number] Patient Account Number: [id number] Date of Birth/Sex: 05/25/1946 (75 y.o. M) Treating RN: Cornell Barman Primary Care Cheryl Stabenow: [name] Other Clinician: Referring Gabriana Wilmott: [name] Treating Sayuri Rhames/Extender: Skipper Cliche in Treatment: 1 Clinic Level of Care Assessment Items TOOL 1 Quantity Score []  - Use when EandM and Procedure is performed on INITIAL visit 0 ASSESSMENTS - Nursing Assessment / Reassessment []  - General Physical Exam (combine w/ comprehensive assessment (listed just below) when performed on new 0 pt. evals) []  - 0 Comprehensive Assessment (HX, ROS, Risk Assessments, Wounds Hx, etc.) ASSESSMENTS - Wound and Skin Assessment / Reassessment []  - Dermatologic / Skin Assessment  (not related to wound area) 0 ASSESSMENTS - Ostomy and/or Continence Assessment and Care []  - Incontinence Assessment and Management 0 []  - 0 Ostomy Care Assessment and Management (repouching, etc.) PROCESS - Coordination of Care []  - Simple Patient / Family Education for ongoing care 0 []  - 0 Complex (extensive) Patient / Family Education for ongoing care []  - 0 Staff obtains Programmer, systems, Records, Test Results / Process Orders []  - 0 Staff telephones HHA, Nursing Homes / Clarify orders / etc []  - 0 Routine Transfer to another Facility (non-emergent condition) []  - 0 Routine Hospital Admission (non-emergent condition) []  - 0 New Admissions / Biomedical engineer / Ordering NPWT, Apligraf, etc. []  - 0 Emergency Hospital Admission (emergent condition) PROCESS - Special Needs []  - Pediatric / Minor Patient Management 0 []  - 0 Isolation Patient Management []  - 0 Hearing / Language / Visual special needs []  - 0 Assessment of Community assistance (transportation, D/C planning, etc.) []  - 0 Additional assistance / Altered mentation []  - 0 Support Surface(s) Assessment (bed, cushion, seat, etc.) INTERVENTIONS - Miscellaneous []  - External ear exam 0 []  - 0 Patient Transfer (multiple staff / Civil Service fast streamer / Similar devices) []  - 0 Simple Staple / Suture removal (25 or less) []  - 0 Complex Staple / Suture removal (26 or more) []  - 0 Hypo/Hyperglycemic Management (do not check if billed separately) []  - 0 Ankle / Brachial Index (ABI) - do not check if billed separately Has the patient been seen at the hospital within the last three years: Yes Total Score: 0 Level Of Care: ____ Adam Merritt ([id number]) Electronic Signature(s) Signed: 04/04/2021 3:17:27 PM By: Gretta Cool, BSN, RN, CWS, Kim RN, BSN Entered By: Gretta Cool, BSN, RN, CWS, Kim on 04/01/2021 11:58:44 Adam Merritt ([id number]) -------------------------------------------------------------------------------- Compression  Therapy Details Patient Name: Adam Merritt Date of Service: 04/01/2021 11:30 AM Medical Record Number: [id number] Patient Account Number: [id number] Date of Birth/Sex: 1946-04-06 (75 y.o.  M) Treating RN: Cornell Barman Primary Care Vernon Maish: [name] Other Clinician: Referring Ronak Duquette: [name] Treating Anh Mangano/Extender: Jeri Cos [name]: 1 Compression Therapy Performed for Wound Assessment: NonWound Condition Lymphedema - Right Leg Performed By: Clinician Cornell Barman, RN Compression Type: Four Layer Notes Patient has been tolerating wraps well. Electronic Signature(s) Signed: 04/04/2021 3:17:27 PM By: Gretta Cool, BSN, RN, CWS, Kim RN, BSN Entered By: Gretta Cool, BSN, RN, CWS, Kim on 04/01/2021 11:58:05 Adam Merritt ([id number]) -------------------------------------------------------------------------------- Encounter Discharge Information Details Patient Name: Adam Merritt, Adam Merritt Date of Service: 04/01/2021 11:30 AM Medical Record Number: [id number] Patient Account Number: [id number] Date of Birth/Sex: 30-Apr-1946 (75 y.o. M) Treating RN: Cornell Barman Primary Care Zahi Plaskett: [name] Other Clinician: Referring Kalob Bergen: [name] Treating Skylier [name]/Extender: Skipper Cliche in Treatment: 1 Encounter Discharge Information Items Discharge Condition: Stable Ambulatory Status: Ambulatory Discharge Destination: Home Transportation: Private Auto Accompanied By: wife Schedule Follow-up Appointment: Yes Clinical Summary of Care: Electronic Signature(s) Signed: 04/04/2021 3:17:27 PM By: Gretta Cool, BSN, RN, CWS, Kim RN, BSN Entered By: Gretta Cool, BSN, RN, CWS, Kim on 04/01/2021 11:58:38 Adam Merritt ([id number]) -------------------------------------------------------------------------------- Wound Assessment Details Patient Name: Adam Merritt Date of Service: 04/01/2021 11:30 AM Medical Record Number: [id number] Patient Account Number: [id number] Date of Birth/Sex: Feb 15, 1946  (75 y.o. M) Treating RN: Cornell Barman Primary Care Damarie Schoolfield: [name] Other Clinician: Referring Fumi Guadron: [name] Treating Yasemin Rabon/Extender: Jeri Cos [name]: 1 Wound Status Wound Number: 41 Primary Etiology: Venous Leg Ulcer Wound Location: Right, Circumferential Lower Leg Wound Status: Open Wounding Event: Gradually Appeared Date Acquired: 03/05/2021 Weeks Of Treatment: 1 Clustered Wound: No Wound Measurements Length: (cm) 12 Width: (cm) 12 Depth: (cm) 0.01 Area: (cm) 113.097 Volume: (cm) 1.131 % Reduction in Area: 77.8% % Reduction in Volume: 97.8% Wound Description Classification: Full Thickness Without Exposed Support Structu Exudate Amount: Medium Exudate Type: Serosanguineous Exudate Color: red, brown res Treatment Notes Wound #41 (Lower Leg) Wound Laterality: Right, Circumferential Cleanser Peri-Wound Care Topical Primary Dressing Silvercel 4 1/4x 4 1/4 (in/in) Discharge Instruction: Apply Silvercel 4 1/4x 4 1/4 (in/in) as instructed Secondary Dressing ABD Pad 5x9 (in/in) Discharge Instruction: Cover with ABD pad Secured With Compression Wrap Medichoice 4 layer Compression System, 35-40 mmHG Discharge Instruction: Apply multi-layer wrap as directed. Compression Stockings Add-Ons Electronic Signature(s) Signed: 04/04/2021 3:17:27 PM By: Gretta Cool, BSN, RN, CWS, Kim RN, BSN Entered By: Gretta Cool, BSN, RN, CWS, Kim on 04/01/2021 11:57:08 Adam Merritt ([id number]) -------------------------------------------------------------------------------- Wound Assessment Details Patient Name: Adam Merritt Date of Service: 04/01/2021 11:30 AM Medical Record Number: [id number] Patient Account Number: [id number] Date of Birth/Sex: Aug 30, 1945 (75 y.o. M) Treating RN: Cornell Barman Primary Care Solene Hereford: [name] Other Clinician: Referring Krystian Younglove: [name] Treating Montserrath Madding/Extender: Jeri Cos [name]: 1 Wound Status Wound Number:  42 Primary Etiology: Venous Leg Ulcer Wound Location: Right, Medial Lower Leg Wound Status: Open Wounding Event: Gradually Appeared Date Acquired: 03/05/2021 Weeks Of Treatment: 1 Clustered Wound: No Wound Measurements Length: (cm) 0.1 Width: (cm) 0.1 Depth: (cm) 0.1 Area: (cm) 0.008 Volume: (cm) 0.001 % Reduction in Area: 95.9% % Reduction in Volume: 95% Wound Description Classification: Full Thickness Without Exposed Support Structu Exudate Amount: Medium Exudate Type: Serosanguineous Exudate Color: red, brown res Treatment Notes Wound #42 (Lower Leg) Wound Laterality: Right, Medial Cleanser Peri-Wound Care Topical Primary Dressing Silvercel 4 1/4x 4 1/4 (in/in) Discharge Instruction: Apply Silvercel 4 1/4x 4 1/4 (in/in) as instructed Secondary Dressing ABD Pad 5x9 (in/in) Discharge Instruction: Cover with ABD pad Secured With Compression  Wrap Medichoice 4 layer Compression System, 35-40 mmHG Discharge Instruction: Apply multi-layer wrap as directed. Compression Stockings Environmental education officer) Signed: 04/04/2021 3:17:27 PM By: Gretta Cool, BSN, RN, CWS, Kim RN, BSN Entered By: Gretta Cool, BSN, RN, CWS, Kim on 04/01/2021 11:57:08

## 2021-04-06 DIAGNOSIS — I509 Heart failure, unspecified: Secondary | ICD-10-CM | POA: Diagnosis not present

## 2021-04-08 ENCOUNTER — Encounter: Payer: Medicare PPO | Attending: Physician Assistant | Admitting: Physician Assistant

## 2021-04-08 ENCOUNTER — Other Ambulatory Visit: Payer: Self-pay

## 2021-04-08 DIAGNOSIS — L97822 Non-pressure chronic ulcer of other part of left lower leg with fat layer exposed: Secondary | ICD-10-CM | POA: Insufficient documentation

## 2021-04-08 DIAGNOSIS — E11621 Type 2 diabetes mellitus with foot ulcer: Secondary | ICD-10-CM | POA: Diagnosis not present

## 2021-04-08 DIAGNOSIS — I872 Venous insufficiency (chronic) (peripheral): Secondary | ICD-10-CM | POA: Diagnosis not present

## 2021-04-08 DIAGNOSIS — I7389 Other specified peripheral vascular diseases: Secondary | ICD-10-CM | POA: Diagnosis not present

## 2021-04-08 DIAGNOSIS — I890 Lymphedema, not elsewhere classified: Secondary | ICD-10-CM | POA: Diagnosis not present

## 2021-04-08 DIAGNOSIS — I10 Essential (primary) hypertension: Secondary | ICD-10-CM | POA: Diagnosis not present

## 2021-04-08 DIAGNOSIS — Z794 Long term (current) use of insulin: Secondary | ICD-10-CM | POA: Diagnosis not present

## 2021-04-08 DIAGNOSIS — L97812 Non-pressure chronic ulcer of other part of right lower leg with fat layer exposed: Secondary | ICD-10-CM | POA: Diagnosis not present

## 2021-04-08 DIAGNOSIS — E119 Type 2 diabetes mellitus without complications: Secondary | ICD-10-CM | POA: Insufficient documentation

## 2021-04-08 DIAGNOSIS — L97515 Non-pressure chronic ulcer of other part of right foot with muscle involvement without evidence of necrosis: Secondary | ICD-10-CM | POA: Diagnosis not present

## 2021-04-08 NOTE — Progress Notes (Addendum)
KALEEL, SCHMIEDER ([id number]) Visit Report for 04/08/2021 Arrival Information Details Patient Name: Adam Merritt, Adam Merritt Date of Service: 04/08/2021 1:15 PM Medical Record Number: [id number] Patient Account Number: [id number] Date of Birth/Sex: Oct 22, 1945 (75 y.o. M) Treating RN: Carlene Coria Primary Care [name] Sami: [name] Other Clinician: Referring Glendal Cassaday: [name] Treating Hancel Ion/Extender: Skipper Cliche in Treatment: 2 Visit Information History Since Last Visit All ordered tests and consults were completed: No Patient Arrived: Wheel Chair Added or deleted any medications: No Arrival Time: 13:30 Any new allergies or adverse reactions: No Accompanied By: self Had a fall or experienced change in No Transfer Assistance: None activities of daily living that may affect Patient Identification Verified: Yes risk of falls: Secondary Verification Process Completed: Yes Signs or symptoms of abuse/neglect since last visito No Patient Requires Transmission-Based Precautions: No Hospitalized since last visit: No Patient Has Alerts: No Implantable device outside of the clinic excluding No cellular tissue based products placed in the center since last visit: Has Dressing in Place as Prescribed: Yes Has Compression in Place as Prescribed: Yes Pain Present Now: No Electronic Signature(s) Signed: 05/11/2021 1:44:34 PM By: Carlene Coria RN Entered By: Carlene Coria on 04/08/2021 13:38:43 [name] ([id number]) -------------------------------------------------------------------------------- Clinic Level of Care Assessment Details Patient Name: [name] Date of Service: 04/08/2021 1:15 PM Medical Record Number: [id number] Patient Account Number: [id number] Date of Birth/Sex: Oct 22, 1945 (75 y.o. M) Treating RN: Carlene Coria Primary Care Arleen Bar: [name] Other Clinician: Referring Analysa Nutting: [name] Treating Nikitta Sobiech/Extender: Skipper Cliche in Treatment: 2 Clinic  Level of Care Assessment Items TOOL 1 Quantity Score []  - Use when EandM and Procedure is performed on INITIAL visit 0 ASSESSMENTS - Nursing Assessment / Reassessment []  - General Physical Exam (combine w/ comprehensive assessment (listed just below) when performed on new 0 pt. evals) []  - 0 Comprehensive Assessment (HX, ROS, Risk Assessments, Wounds Hx, etc.) ASSESSMENTS - Wound and Skin Assessment / Reassessment []  - Dermatologic / Skin Assessment (not related to wound area) 0 ASSESSMENTS - Ostomy and/or Continence Assessment and Care []  - Incontinence Assessment and Management 0 []  - 0 Ostomy Care Assessment and Management (repouching, etc.) PROCESS - Coordination of Care []  - Simple Patient / Family Education for ongoing care 0 []  - 0 Complex (extensive) Patient / Family Education for ongoing care []  - 0 Staff obtains Programmer, systems, Records, Test Results / Process Orders []  - 0 Staff telephones HHA, Nursing Homes / Clarify orders / etc []  - 0 Routine Transfer to another Facility (non-emergent condition) []  - 0 Routine Hospital Admission (non-emergent condition) []  - 0 New Admissions / Biomedical engineer / Ordering NPWT, Apligraf, etc. []  - 0 Emergency Hospital Admission (emergent condition) PROCESS - Special Needs []  - Pediatric / Minor Patient Management 0 []  - 0 Isolation Patient Management []  - 0 Hearing / Language / Visual special needs []  - 0 Assessment of Community assistance (transportation, D/C planning, etc.) []  - 0 Additional assistance / Altered mentation []  - 0 Support Surface(s) Assessment (bed, cushion, seat, etc.) INTERVENTIONS - Miscellaneous []  - External ear exam 0 []  - 0 Patient Transfer (multiple staff / Civil Service fast streamer / Similar devices) []  - 0 Simple Staple / Suture removal (25 or less) []  - 0 Complex Staple / Suture removal (26 or more) []  - 0 Hypo/Hyperglycemic Management (do not check if billed separately) []  - 0 Ankle / Brachial Index  (ABI) - do not check if billed separately Has the patient been seen at the hospital within the  last three years: Yes Total Score: 0 Level Of Care: ____ [name] ([id number]) Electronic Signature(s) Signed: 05/11/2021 1:44:34 PM By: Carlene Coria RN Entered By: Carlene Coria on 04/08/2021 14:22:04 [name] ([id number]) -------------------------------------------------------------------------------- Compression Therapy Details Patient Name: [name] Date of Service: 04/08/2021 1:15 PM Medical Record Number: [id number] Patient Account Number: [id number] Date of Birth/Sex: Apr 09, 1946 (75 y.o. M) Treating RN: Carlene Coria Primary Care Nephi Savage: [name] Other Clinician: Referring Zairah Arista: [name] Treating Kendrik Mcshan/Extender: Skipper Cliche in Treatment: 2 Compression Therapy Performed for Wound Assessment: Wound #41 Right,Circumferential Lower Leg Performed By: Clinician Carlene Coria, RN Compression Type: Four Layer Post Procedure Diagnosis Same as Pre-procedure Electronic Signature(s) Signed: 05/11/2021 1:44:34 PM By: Carlene Coria RN Entered By: Carlene Coria on 04/08/2021 13:57:18 [name] ([id number]) -------------------------------------------------------------------------------- Compression Therapy Details Patient Name: [name] Date of Service: 04/08/2021 1:15 PM Medical Record Number: [id number] Patient Account Number: [id number] Date of Birth/Sex: 1946-01-13 (75 y.o. M) Treating RN: Carlene Coria Primary Care Brendt Dible: [name] Other Clinician: Referring Yudith Norlander: [name] Treating Alysha Doolan/Extender: Jeri Cos [name]: 2 Compression Therapy Performed for Wound Assessment: Wound #42 Right,Medial Lower Leg Performed By: Clinician Carlene Coria, RN Compression Type: Four Layer Post Procedure Diagnosis Same as Pre-procedure Electronic Signature(s) Signed: 05/11/2021 1:44:34 PM By: Carlene Coria RN Entered By: Carlene Coria on  04/08/2021 13:57:34 [name] ([id number]) -------------------------------------------------------------------------------- Compression Therapy Details Patient Name: [name] Date of Service: 04/08/2021 1:15 PM Medical Record Number: [id number] Patient Account Number: [id number] Date of Birth/Sex: 06-16-1945 (75 y.o. M) Treating RN: Carlene Coria Primary Care Zephyra Bernardi: [name] Other Clinician: Referring Meriem Lemieux: [name] Treating Christian Treadway/Extender: Skipper Cliche in Treatment: 2 Compression Therapy Performed for Wound Assessment: NonWound Condition Lymphedema - Right Leg Performed By: Clinician Carlene Coria, RN Compression Type: Four Layer Post Procedure Diagnosis Same as Pre-procedure Electronic Signature(s) Signed: 04/08/2021 2:22:43 PM By: Carlene Coria RN Entered By: Carlene Coria on 04/08/2021 14:22:43 [name] ([id number]) -------------------------------------------------------------------------------- Encounter Discharge Information Details Patient Name: [name] Date of Service: 04/08/2021 1:15 PM Medical Record Number: [id number] Patient Account Number: [id number] Date of Birth/Sex: 1946/03/30 (75 y.o. M) Treating RN: Carlene Coria Primary Care Sabryna Lahm: [name] Other Clinician: Referring Loma [place]: [name] Treating Yoandri Congrove/Extender: Skipper Cliche in Treatment: 2 Encounter Discharge Information Items Discharge Condition: Stable Ambulatory Status: Wheelchair Discharge Destination: Home Transportation: Private Auto Accompanied By: wife Schedule Follow-up Appointment: Yes Clinical Summary of Care: Patient Declined Electronic Signature(s) Signed: 04/08/2021 2:24:05 PM By: Carlene Coria RN Entered By: Carlene Coria on 04/08/2021 14:24:05 [name] ([id number]) -------------------------------------------------------------------------------- Lower Extremity Assessment Details Patient Name: [name] Date of Service:  04/08/2021 1:15 PM Medical Record Number: [id number] Patient Account Number: [id number] Date of Birth/Sex: 08-16-1945 (75 y.o. M) Treating RN: Carlene Coria Primary Care Ved Martos: [name] Other Clinician: Referring Javon Snee: [name] Treating Feven Alderfer/Extender: Jeri Cos [name]: 2 Edema Assessment Assessed: [Left: No] [Right: No] [Left: Edema] [Right: :] Calf Left: Right: Point of Measurement: 35 cm From Medial Instep 68 cm Ankle Left: Right: Point of Measurement: From Medial Instep 36 cm Knee To Floor Left: Right: From Medial Instep 40 cm Vascular Assessment Pulses: Dorsalis Pedis Palpable: [Right:Yes] Electronic Signature(s) Signed: 05/11/2021 1:44:34 PM By: Carlene Coria RN Entered By: Carlene Coria on 04/08/2021 13:53:03 [name] ([id number]) -------------------------------------------------------------------------------- Multi Wound Chart Details Patient Name: [name] Date of Service: 04/08/2021 1:15 PM Medical Record Number: [id number] Patient Account Number: [id number] Date of Birth/Sex:  June 25, 1945 (75 y.o. M) Treating RN: Carlene Coria Primary Care Hyde Sires: [name] Other Clinician: Referring Ahnesti Townsend: [name] Treating Alysandra Lobue/Extender: Jeri Cos [name]: 2 Vital Signs Height(in): 57 Pulse(bpm): 67 Weight(lbs): 300 Blood Pressure(mmHg): 157/74 Body Mass Index(BMI): 48 Temperature(F): 99.1 Respiratory Rate(breaths/min): 20 Photos: [N/A:N/A] Wound Location: Right, Circumferential Lower Leg Right, Medial Lower Leg N/A Wounding Event: Gradually Appeared Gradually Appeared N/A Primary Etiology: Venous Leg Ulcer Venous Leg Ulcer N/A Comorbid History: Anemia, Lymphedema, Chronic Anemia, Lymphedema, Chronic N/A Obstructive Pulmonary Disease Obstructive Pulmonary Disease (COPD), Sleep Apnea, Congestive (COPD), Sleep Apnea, Congestive Heart Failure, Coronary Artery Heart Failure, Coronary Artery Disease,  Hypertension, Peripheral Disease, Hypertension, Peripheral Venous Disease, Type II Diabetes, Venous Disease, Type II Diabetes, Osteoarthritis, Neuropathy, Osteoarthritis, Neuropathy, Received Chemotherapy Received Chemotherapy Date Acquired: 03/05/2021 03/05/2021 N/A Weeks of Treatment: 2 2 N/A Wound Status: Open Open N/A Measurements L x W x D (cm) 11x10x0.1 1.5x2x0.1 N/A Area (cm) : 86.394 2.356 N/A Volume (cm) : 8.639 0.236 N/A % Reduction in Area: 83.00% -1102.00% N/A % Reduction in Volume: 83.00% -1080.00% N/A Classification: Full Thickness Without Exposed Full Thickness Without Exposed N/A Support Structures Support Structures Exudate Amount: Medium Medium N/A Exudate Type: Serosanguineous Serosanguineous N/A Exudate Color: red, brown red, brown N/A Granulation Amount: Large (67-100%) Large (67-100%) N/A Necrotic Amount: None Present (0%) None Present (0%) N/A Exposed Structures: Fat Layer (Subcutaneous Tissue): Fat Layer (Subcutaneous Tissue): N/A Yes Yes Fascia: No Fascia: No Tendon: No Tendon: No Muscle: No Muscle: No Joint: No Joint: No Bone: No Bone: No Epithelialization: None None N/A Treatment Notes Electronic Signature(s) Signed: 05/11/2021 1:44:34 PM By: Carlene Coria RN Entered By: Carlene Coria on 04/08/2021 13:56:12 [name] ([id number]) CURRAN, LENDERMAN ([id number]) -------------------------------------------------------------------------------- Multi-Disciplinary Care Plan Details Patient Name: [name] Date of Service: 04/08/2021 1:15 PM Medical Record Number: [id number] Patient Account Number: [id number] Date of Birth/Sex: 11-23-1945 (75 y.o. M) Treating RN: Carlene Coria Primary Care Attallah Ontko: [name] Other Clinician: Referring Daris Harkins: [name] Treating Nitasha Jewel/Extender: Skipper Cliche in Treatment: 2 Active Inactive Abuse / Safety / Falls / Self Care Management Nursing Diagnoses: Potential for injury related to  falls Goals: Patient will remain injury free related to falls Date Initiated: 03/25/2021 Target Resolution Date: 04/25/2021 Goal Status: Active Interventions: Assess Activities of Daily Living upon admission and as needed Assess fall risk on admission and as needed Assess: immobility, friction, shearing, incontinence upon admission and as needed Assess impairment of mobility on admission and as needed per policy Assess personal safety and home safety (as indicated) on admission and as needed Assess self care needs on admission and as needed Notes: Nutrition Nursing Diagnoses: Potential for alteratiion in Nutrition/Potential for imbalanced nutrition Goals: Patient/caregiver verbalizes understanding of need to maintain therapeutic glucose control per primary care physician Date Initiated: 03/25/2021 Target Resolution Date: 04/25/2021 Goal Status: Active Interventions: Assess patient nutrition upon admission and as needed per policy Notes: Wound/Skin Impairment Nursing Diagnoses: Knowledge deficit related to ulceration/compromised skin integrity Goals: Patient/caregiver will verbalize understanding of skin care regimen Date Initiated: 03/25/2021 Target Resolution Date: 04/25/2021 Goal Status: Active Ulcer/skin breakdown will have a volume reduction of 30% by week 4 Date Initiated: 03/25/2021 Target Resolution Date: 05/25/2021 Goal Status: Active Ulcer/skin breakdown will have a volume reduction of 50% by week 8 Date Initiated: 03/25/2021 Target Resolution Date: 06/05/2021 Goal Status: Active Ulcer/skin breakdown will have a volume reduction of 80% by week 12 Date Initiated: 03/25/2021 Target Resolution Date: 07/26/2021 Goal Status: Active KONG, PACKETT ([id number]) Ulcer/skin  breakdown will heal within 14 weeks Date Initiated: 03/25/2021 Target Resolution Date: 08/23/2021 Goal Status: Active Interventions: Assess patient/caregiver ability to obtain necessary  supplies Assess patient/caregiver ability to perform ulcer/skin care regimen upon admission and as needed Assess ulceration(s) every visit Notes: Electronic Signature(s) Signed: 05/11/2021 1:44:34 PM By: Carlene Coria RN Entered By: Carlene Coria on 04/08/2021 13:55:59 [name] ([id number]) -------------------------------------------------------------------------------- Pain Assessment Details Patient Name: [name] Date of Service: 04/08/2021 1:15 PM Medical Record Number: [id number] Patient Account Number: [id number] Date of Birth/Sex: 1945-06-10 (75 y.o. M) Treating RN: Carlene Coria Primary Care Amayra Kiedrowski: [name] Other Clinician: Referring Van Ehlert: [name] Treating Shelbe Haglund/Extender: Skipper Cliche in Treatment: 2 Active Problems Location of Pain Severity and Description of Pain Patient Has Paino No Site Locations Pain Management and Medication Current Pain Management: Electronic Signature(s) Signed: 05/11/2021 1:44:34 PM By: Carlene Coria RN Entered By: Carlene Coria on 04/08/2021 13:39:14 [name] ([id number]) -------------------------------------------------------------------------------- Patient/Caregiver Education Details Patient Name: [name] Date of Service: 04/08/2021 1:15 PM Medical Record Number: [id number] Patient Account Number: [id number] Date of Birth/Gender: 09-Jul-1945 (75 y.o. M) Treating RN: Carlene Coria Primary Care Physician: [name] Other Clinician: Referring Physician: [name] Treating Physician/Extender: Skipper Cliche in Treatment: 2 Education Assessment Education Provided To: Patient Education Topics Provided Wound/Skin Impairment: Methods: Explain/Verbal Responses: State content correctly Electronic Signature(s) Signed: 05/11/2021 1:44:34 PM By: Carlene Coria RN Entered By: Carlene Coria on 04/08/2021 14:23:11 [name]  ([id number]) -------------------------------------------------------------------------------- Wound Assessment Details Patient Name: [name] Date of Service: 04/08/2021 1:15 PM Medical Record Number: [id number] Patient Account Number: [id number] Date of Birth/Sex: 03-18-1946 (75 y.o. M) Treating RN: Carlene Coria Primary Care Merridith Dershem: [name] Other Clinician: Referring Nataliah Hatlestad: [name] Treating Sedrick Tober/Extender: Jeri Cos [name]: 2 Wound Status Wound Number: 41 Primary Venous Leg Ulcer Etiology: Wound Location: Right, Circumferential Lower Leg Wound Open Wounding Event: Gradually Appeared Status: Date Acquired: 03/05/2021 Comorbid Anemia, Lymphedema, Chronic Obstructive Pulmonary Weeks Of Treatment: 2 History: Disease (COPD), Sleep Apnea, Congestive Heart Failure, Clustered Wound: No Coronary Artery Disease, Hypertension, Peripheral Venous Disease, Type II Diabetes, Osteoarthritis, Neuropathy, Received Chemotherapy Photos Wound Measurements Length: (cm) 11 Width: (cm) 10 Depth: (cm) 0.1 Area: (cm) 86.394 Volume: (cm) 8.639 % Reduction in Area: 83% % Reduction in Volume: 83% Epithelialization: None Tunneling: No Undermining: No Wound Description Classification: Full Thickness Without Exposed Support Structu Exudate Amount: Medium Exudate Type: Serosanguineous Exudate Color: red, brown res Wound Bed Granulation Amount: Large (67-100%) Exposed Structure Necrotic Amount: None Present (0%) Fascia Exposed: No Fat Layer (Subcutaneous Tissue) Exposed: Yes Tendon Exposed: No Muscle Exposed: No Joint Exposed: No Bone Exposed: No Electronic Signature(s) Signed: 05/11/2021 1:44:34 PM By: Carlene Coria RN Entered By: Carlene Coria on 04/08/2021 13:51:32 [name] ([id number]) -------------------------------------------------------------------------------- Wound Assessment Details Patient Name: [name] Date of Service: 04/08/2021  1:15 PM Medical Record Number: [id number] Patient Account Number: [id number] Date of Birth/Sex: [birth year] (75 y.o. M) Treating RN: Carlene Coria Primary Care Jolicia Delira: [name] Other Clinician: Referring Trueman Worlds: [name] Treating Dayani Winbush/Extender: Jeri Cos [name]: 2 Wound Status Wound Number: [id number] Primary Venous Leg Ulcer Etiology: Wound Location: Right, Medial Lower Leg Wound Open Wounding Event: Gradually Appeared Status: Date Acquired: 03/05/2021 Comorbid Anemia, Lymphedema, Chronic Obstructive Pulmonary Weeks Of Treatment: 2 History: Disease (COPD), Sleep Apnea, Congestive Heart Failure, Clustered Wound: No Coronary Artery Disease, Hypertension, Peripheral Venous Disease, Type II Diabetes, Osteoarthritis, Neuropathy, Received Chemotherapy Photos Wound Measurements Length: (cm) 1.5 Width: (cm) 2  Depth: (cm) 0.1 Area: (cm) 2.356 Volume: (cm) 0.236 % Reduction in Area: -1102% % Reduction in Volume: -1080% Epithelialization: None Tunneling: No Undermining: No Wound Description Classification: Full Thickness Without Exposed Support Structu Exudate Amount: Medium Exudate Type: Serosanguineous Exudate Color: red, brown res Wound Bed Granulation Amount: Large (67-100%) Exposed Structure Necrotic Amount: None Present (0%) Fascia Exposed: No Fat Layer (Subcutaneous Tissue) Exposed: Yes Tendon Exposed: No Muscle Exposed: No Joint Exposed: No Bone Exposed: No Electronic Signature(s) Signed: 05/11/2021 1:44:34 PM By: Carlene Coria RN Entered By: Carlene Coria on 04/08/2021 13:52:07 [name] ([id number]) -------------------------------------------------------------------------------- [place] Details Patient Name: [name] Date of Service: 04/08/2021 1:15 PM Medical Record Number: [id number] Patient Account Number: [id number] Date of Birth/Sex: 1945/10/06 (75 y.o. M) Treating RN: Carlene Coria Primary Care Preciosa Bundrick: [name] Other  Clinician: Referring Merick Kelleher: [name] Treating Rayelynn Loyal/Extender: Skipper Cliche in Treatment: 2 Vital Signs Time Taken: 13:38 Temperature (F): 99.1 Height (in): 66 Pulse (bpm): 70 Weight (lbs): 300 Respiratory Rate (breaths/min): 20 Body Mass Index (BMI): 48.4 Blood Pressure (mmHg): 157/74 Reference Range: 80 - 120 mg / dl Electronic Signature(s) Signed: 05/11/2021 1:44:34 PM By: Carlene Coria RN Entered By: Carlene Coria on 04/08/2021 13:39:03

## 2021-04-08 NOTE — Progress Notes (Addendum)
TIRRELL, BUCHBERGER ([id number]) Visit Report for 04/08/2021 Chief Complaint Document Details Patient Name: Adam Merritt, Adam Merritt Date of Service: 04/08/2021 1:15 PM Medical Record Number: [id number] Patient Account Number: [id number] Date of Birth/Sex: 1946/02/21 (75 y.o. M) Treating RN: Carlene Coria Primary Care Provider: [name] Other Clinician: Referring Provider: [name] Treating Provider/Extender: Skipper Cliche in Treatment: 2 Information Obtained from: Patient Chief Complaint Bilateral LE edema and weeping ulcers Electronic Signature(s) Signed: 04/08/2021 1:08:49 PM By: Worthy Keeler PA-C Entered By: Worthy Keeler on 04/08/2021 13:08:49 Adam Merritt ([id number]) -------------------------------------------------------------------------------- HPI Details Patient Name: Adam Merritt Date of Service: 04/08/2021 1:15 PM Medical Record Number: [id number] Patient Account Number: [id number] Date of Birth/Sex: 08/12/1945 (75 y.o. M) Treating RN: Carlene Coria Primary Care Provider: [name] Other Clinician: Referring Provider: [name] Treating Provider/Extender: Skipper Cliche in Treatment: 2 History of Present Illness HPI Description: 10/18/17-He is here for initial evaluation of bilateral lower extremity ulcerations in the presence of venous insufficiency and lymphedema. He has been seen by vascular medicine in the past, Dr. Lucky Cowboy, last seen in 2016. He does have a history of abnormal ABIs, which is to be expected given his lymphedema and venous insufficiency. According to Epic, it appears that all attempts for arterial evaluation and/or angiography were not follow through with by patient. He does have a history of being seen in lymphedema clinic in 2018, stopped going approximately 6 months ago stating "it didn't do any good". He does not have lymphedema pumps, he does not have custom fit compression wrap/stockings. He is diabetic and his recent A1c last month was 7.6. He  admits to chronic bilateral lower extremity pain, no change in pain since blister and ulceration development. He is currently being treated with Levaquin for bronchitis. He has home health and we will continue. 10/25/17-He is here in follow-up evaluation for bilateral lower extremity ulcerationssubtle he remains on Levaquin for bronchitis. Right lower extremity with no evidence of drainage or ulceration, persistent left lower extremity ulceration. He states that home health has not been out since his appointment. He went to [facility] on Tuesday, studies revealed: RIGHT ABI 0.9, TBI 0.6 LEFT ABI 1.1, TBI 0.6 with triphasic flow bilaterally. We will continue his same treatment plan. He has been educated on compression therapy and need for elevation. He will benefit from lymphedema pumps 11/01/17-He is here in follow-up evaluation for left lower extremity ulcer. The right lower extremity remains healed. He has home health services, but they have not been out to see the patient for 2-3 weeks. He states it home health physical therapy changed his dressing yesterday after therapy; he placed Ace wrap compression. We are still waiting for lymphedema pumps, reordered d/t need for company change. 11/08/17-He is here in follow-up evaluation for left lower extremity ulcer. It is improved. Edema is significantly improved with compression therapy. We will continue with same treatment plan and he will follow-up next week. No word regarding lymphedema pumps 11/15/17-He is here in follow-up evaluation for left lower extremity ulcer. He is healed and will be discharged from wound care services. I have reached out to [facility] regarding his lymphedema pumps. They have been unable to reach the patient; the contact number they had with the patient's wife's cell phone and she has not answered any unrecognized calls. Contact should be made today, trial planned for next week; [facility] will  continue to follow 11/27/17 on evaluation today patient has multiple blistered areas over the right  lower extremity his left lower extremity appears to be doing okay. These blistered areas show signs of no infection which is great news. With that being said he did have some necrotic skin overlying which was mechanically debrided away with saline and gauze today without complication. Overall post debridement the wounds appear to be doing better but in general his swelling seems to be increased. This is obviously not good news. I think this is what has given rise to the blisters. 12/04/17 on evaluation today patient presents for follow-up concerning his bilateral lower extremity edema in the right lower extremity ulcers. He has been tolerating the dressing changes without complication. With that being said he has had no real issues with the wraps which is also good news. Overall I'm pleased with the progress he's been making. 12/11/17 on evaluation today patient appears to be doing rather well in regard to his right lateral lower extremity ulcer. He's been tolerating the dressing changes without complication. Fortunately there does not appear to be any evidence of infection at this time. Overall I'm pleased with the progress that is being made. Unfortunately he has been in the hospital due to having what sounds to be a stomach virus/flu fortunately that is starting to get better. 12/18/17 on evaluation today patient actually appears to be doing very well in regard to his bilateral lower extremities the swelling is under fairly good control his lymphedema pumps are still not up and running quite yet. With that being said he does have several areas of opening noted as far as wounds are concerned mainly over the left lower extremity. With that being said I do believe once he gets lymphedema pumps this would least help you mention some the fluid and preventing this from occurring. Hopefully that will be set up  soon sleeves are Artie in place at his home he just waiting for the machine. 12/25/17 on evaluation today patient actually appears to be doing excellent in fact all of his ulcers appear to have resolved his legs appear very well. I do think he needs compression stockings we have discussed this and they are actually going to go to [facility] today to elastic therapy to get this fitted for him. I think that is definitely a good thing to do. Readmission: 04/09/18 upon evaluation today this patient is seen for readmission due to bilateral lower extremity lymphedema. He has significant swelling of his extremities especially on the left although the right is also swollen he has weeping from both sides. There are no obvious open wounds at this point. Fortunately he has been doing fairly well for quite a bit of time since I last saw him. Nonetheless unfortunately this seems to have reopened and is giving quite a bit of trouble. He states this began about a week ago when he first called [place] to get in to be seen. No fevers, chills, nausea, or vomiting noted at this time. He has not been using his lymphedema pumps due to the fact that they won't fit on his leg at this point likewise is also not been using his compression for essentially the same reason. 04/16/18 upon evaluation today patient actually appears to be doing a little better in regard to the fluid in his bilateral lower extremities. With that being said he's had three falls since I saw him last week. He also states that he's been feeling very poorly. I was concerned last week and feel like that the concern is still there as far as the congestion  in his chest is concerned he seems to be breathing about the same as last week but again he states he's very weak he's not even able to walk further than from the chair to the door. His wife had to buy a wheelchair just to be able to get them out of the house to get to the appointment today. This has me very  concerned. 04/23/18 on evaluation today patient actually appears to be doing much better than last week's evaluation. At that time actually had to transport him to the ER via EMS and he subsequently was admitted for acute pulmonary edema, acute renal failure, and acute congestive heart failure. Fortunately he is doing much better. Apparently they did dialyze him and were able to take off roughly 35 pounds of fluid. Nonetheless he is feeling much better both in regard to his breathing and he's able to get around much better at this time compared to previous. Overall I'm very GENO, SYDNOR ([id number]) happy with how things are at this time. There does not appear to be any evidence of infection currently. No fevers, chills, nausea, or vomiting noted at this time. 04/30/2018 patient seen today for follow-up and management of bilateral lower extremity lymphedema. He did express being more sad today than usual due to the recent loss of his dog. He states that he has been compliant with using the lymphedema pumps. However he does admit a minute over the last 2-3 days he has not been using the pumps due to the recent loss of his dog. At this time there is no drainage or open wounds to his lower extremities. The left leg edema is measuring smaller today. Still has a significant amount of edema on bilateral lower extremities With dry flaky skin. He will be referred to the lymphedema clinic for further management. Will continue 3 layer compression wraps and follow-up in 1-2 weeks.Denies any pain, fever, chairs recently. No recent falls or injuries reported during this visit. 05/07/18 on evaluation today patient actually appears to be doing very well in regard to his lower extremities in general all things considered. With that being said he is having some pain in the legs just due to the amount of swelling. He does have an area where he had a blister on the left lateral lower extremity this is open at this  point other than that there's nothing else weeping at this time. 05/14/18 on evaluation today patient actually appears to be doing excellent all things considered in regard to his lower extremities. He still has a couple areas of weeping on each leg which has continued to be the issue for him. He does have an appointment with the lymphedema clinic although this isn't until February 2020. That was the earliest they had. In the meantime he has continued to tolerate the compression wraps without complication. 05/28/18 on evaluation today patient actually appears to be doing more poorly in regard to his left lower extremity where he has a wound open at this point. He also had a fall where he subsequently injured his right great toe which has led to an open wound at the site unfortunately. He has been tolerating the dressing changes without complication in general as far as the wraps are concerned that he has not been putting any dressing on the left 1st toe ulcer site. 06/11/18 on evaluation today patient appears to be doing much worse in regard to his bilateral lower extremity ulcers. He has been tolerating the dressing changes without complication  although his legs have not been wrapped more recently. Overall I am not very pleased with the way his legs appear. I do believe he needs to be back in compression wraps he still has not received his compression wraps from the [facility] hospital as of yet. 06/18/18 on evaluation today patient actually appears to be doing significantly better than last time I saw him. He has been tolerating the compression wraps without complication in the circumferential ulcers especially appear to be doing much better. His toe ulcer on the right in regard to the great toe is better although not as good as the legs in my pinion. No fevers chills noted 07/02/18 on evaluation today patient appears to be doing much better in regard to his lower extremity ulcers. Unfortunately since I last  saw him he's had the distal portion of his right great toe if he dated it sounds as if this actually went downhill very quickly. I had only seen him a few days prior and the toe did not appear to be infected at that point subsequently became infected very rapidly and it was decided by the surgeon that the distal portion of the toe needed to be removed. The patient seems to be doing well in this regard he tells me. With that being said his lower extremities are doing better from the standpoint of the wounds although he is significantly swollen at this point. 07/09/18 on evaluation today patient appears to be doing better in regard to the wounds on his lower extremities. In fact everything is almost completely healed he is just a small area on the left posterior lower extremity that is open at this point. 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 With that being said he needs some kind of compression it does sound like he may not be wearing his compression, that is the wraps, during the entire time between when he's here visit to visit. Apparently his wife took the current one off because it began to "fall apart". 07/16/18 on evaluation today patient appears to be extremely swollen especially in regard to his left lower extremity unfortunately. He also has a new skin tear over the left lower extremity and there's a smaller area on the right lower extremity as well. Unfortunately this seems to be due in part to blistering and fluid buildup in his leg. He did get the reduction wraps that were ordered by the [facility] hospital for him to go to lymphedema clinic. With that being said his wounds on the legs have not healed to the point to where they would likely accept them as a patient lymphedema clinic currently. We need to try to  get this to heal. With that being said he's been taking his wraps off which is not doing him any favors at this point. In fact this is probably quite counterproductive compared to what needs to occur. We will likely need to increase to a four layer compression wrap and continue to also utilize elevation and he has to keep the wraps on not take them off as he's been doing currently hasn't had a wrap on since Saturday. 07/23/18 on evaluation today patient appears to be doing much better in regard to his bilateral lower extremities. In fact his left lower extremity which was the largest is actually 15 cm smaller today compared  to what it was last time he was here in our clinic. This is obviously good news after just one week. Nonetheless the differences he actually kept the wraps on during the entire week this time. That's not typical for him. I do believe he understands a little bit better now the severity of the situation and why it's important for him to keep these wraps on. 07/30/18 on evaluation today patient actually appears to be doing rather well in regard to his lower extremities. His legs are much smaller than they have been in the past and he actually has only one very small rudder superficial region remaining that is not closed on the left lateral/posterior lower extremity even this is almost completely close. I do believe likely next week he will be healed without any complications. I do think we need to continue the wraps however this seems to be beneficial for him. I also think it may be a good time for [place] to go ahead and see about getting the appointment with the lymphedema clinic which is supposed to be made for him in order to keep this moving along and hopefully get them into compression wraps that will in the end help him to remain healed. 08/06/18 on evaluation today patient actually appears to be doing very well in regard as bilateral lower extremities. In fact his wounds appear to  be completely healed at this time. 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 He is in the process of getting appointment with the lymphedema clinic we have made this referral were just waiting to hear back on the schedule time. We need to follow up on that today as well. 08/13/18 on evaluation today patient actually appears to be doing very well in regard to his bilateral lower extremities there are no open wounds at this point. We are gonna go ahead and see about ordering the Velcro compression wraps for him had a discussion with them about [place] doing it versus the [place] they feel like they can definitely afford going ahead and get the wraps themselves and they would prefer to try to avoid having to go to the lymphedema clinic if it all possible which I completely understand. As long as he has good compression I'm okay either way. 3/17//20 on evaluation today patient actually appears to be doing well in regard to his bilateral lower extremity ulcers. He has been tolerating the dressing changes without complication specifically the compression wraps. Overall is had no issues my fingers finding that I see at this point is that he is having trouble with constipation. He tells me he has not been able to go to the bathroom for about six days. He's taken to over-the- counter oral laxatives unfortunately this is not helping. He has not contacted his doctor. DEANTE, BLOUGH ([id number]) 08/27/18 on evaluation today patient appears to be doing fairly well in regard to his lower extremities at this point. There does not appear to be any new altars is swelling is very well controlled. We are still waiting for his Velcro compression wraps to arrive that should be sometime in the next week is Artie sent the check for these. 09/03/18 on evaluation today patient appears to be doing excellent in regard to his bilateral lower extremities which he shows no signs of  wound openings in regard to this point. He does have his Velcro compression wraps which did arrive in the mail since I saw him last week.  Overall he is doing excellent in my pinion. 09/13/18 on evaluation today patient appears to be doing very well currently in regard to the overall appearance of his bilateral lower extremities although he's a little bit more swollen than last time we saw him. At that point he been discharged without any open wounds. Nonetheless he has a small open wound on the posterior left lower extremity with some evidence of cellulitis noted as well. Fortunately I feel like he has made good progress overall with regard to his lower extremities from were things used to be. 09/20/18 on evaluation today patient actually appears to be doing much better. The erythematous lower extremity is improving wound itself which is still open appears to be doing much better as far as but appearance as well as pain is concerned overall very pleased in this regard. There's no signs of active infection at this time. 09/27/18 on evaluation today patient's wounds on the lower extremity actually appear to be doing fairly well at this time which is good news. There is no evidence of active infection currently and again is just as left lower extremity were there any wounds at this point anyway. I believe they may be completely healed but again I'm not 100% sure based on evaluation today. I think one more week of observation would likely be a good idea. 10/04/18 on evaluation today patient actually appears to be doing excellent in regard to the left lower extremity which actually appears to be completely healed as of today. Unfortunately he's been having issues with his right lower extremity have a new wound that has opened. Fortunately there's no evidence of active infection at this time which is good news. 10/10/18 upon evaluation today patient actually appears to be doing a little bit worse with the new  open area on his right posterior lower extremity. He's been tolerating the dressing changes without complication. Right now we been using his compression wraps although I think we may need to switch back to actually performing bilateral compression wraps are in the clinic. No fevers, chills, nausea, or vomiting noted at this time. 10/17/18 on evaluation today patient actually appears to be doing quite well in regard to his bilateral lower extremity ulcers. He has been tolerating the reps without complication although he would prefer not to rewrap his legs as of today. Fortunately there's no signs of active infection which is good news. No fevers, chills, nausea, or vomiting noted at this time. 10/24/18 on evaluation today patient appears to be doing very well in regard to his lower extremities. His right lower extremity is shown signs of healing and his left lower [place] though not healed appears to be improving which is excellent news. Overall very pleased with how things seem to be progressing at this time. The patient is likewise happy to hear this. His Velcro compression wraps however have not been put on properly will gonna show his wife how to do that properly today. 10/31/18 on evaluation today patient appears to be doing more poorly in regard to his left lower extremity in particular although both lower extremities actually are showing some signs of being worse than my previous evaluation. Unfortunately I'm just not sure that his compression stockings even with the use of the compression/lymphedema pumps seem to be controlling this well. Upon further questioning he tells me that he also is not able to lie flat in the bed due to his congestive heart failure and difficulty breathing. For that reason he sleeps in his  recliner. To make matters worse his recliner also cannot even hold his legs up so instead of even being somewhat elevated they pretty much hang down to the floor. This is the way he  sleeps each night which is definitely counterproductive to everything else that were attempting to do from the standpoint of controlling his fluid. Nonetheless I think that he potentially could benefit from a hospital bed although this would be something that his primary care provider would likely have to order since anything that is order on our side has to be directly related to wound care and again the hospital bed is not necessarily a direct relation to although I think it does contribute to his overall wound status on his lower extremities. 11/07/18 on evaluation today patient appears to be doing better in regard to his bilateral lower extremity. He's been tolerating the dressing changes without complication. We did in the interim since I last saw him switch to just using extras orbiting new alginate and that seems to have done much better for him. I'm very pleased with the overall progress that is made. 11/14/18 on evaluation today patient appears to be redoing rather well in regard to his left lower extremity ulcers which are the only ones remaining at this point. Fortunately there's no signs of active infection at this time which is good news. Overall been very pleased with how things seem to be progressing currently. No fevers, chills, nausea, or vomiting noted at this time. 11/20/17 on evaluation today patient actually appears to be doing quite well in regard to his lower extremities on the left on the right he has several blisters that showed up although there's some question about whether or not he has had his broker compression wraps on like he was supposed to or not. Fortunately there's no signs of active infection at this time which is good news. Unfortunately though he is doing well from the standpoint of the left leg the right leg is not doing as well again this is when we did not wrap last week. 11/28/18 on evaluation today patient appears to be doing well in regard to his bilateral lower  extremities is left appears to be healed is right is not healed but is very close to being so. Overall very pleased with how things seem to be progressing. Patient is likewise happy that things are doing well. 12/09/18 on evaluation today patient actually appears to be completely healed which is excellent news. He actually seems to be doing well in regard to the swelling of the bilateral lower extremities which is also great news. Overall very pleased with how things seem to be progressing. Readmission: 03/18/2019 upon evaluation today patient presents for reevaluation concerning issues that he is having with his left lower extremity where he does have a wound noted upon inspection today. He also has a wound on the right second toe on the tip where it is apparently been rubbing in his shoe I did look at issues and you can actually see where this has been occurring as well. Fortunately there is no signs of infection with regard to the toe necessarily although it is very swollen compared to normal that does have me somewhat concerned about the possibility of further evaluation for infection/osteomyelitis. I would recommend an x-ray to start with. Otherwise he states that it is been 1-2 weeks that he has had the draining in regard to left lower extremity he does not know how this happened he has been wearing his  compression appropriately and I do see that as well today but nonetheless I do think that he needs to continue to be very cautious with regard to elevation as well. ERYX, ZANE ([id number]) 03/25/2019 on evaluation today patient appears to be doing much better in regard to his lower extremity at this time. That is on the left. He did culture positive for Pseudomonas but the good news is he seems to be doing much better the gentamicin we applied topically seems to have done a great job for him. There is no signs of active infection at this time systemically and locally he is doing much better.  No fevers, chills, nausea, vomiting, or diarrhea. In regard to his right toe this seems to be doing much better there is very little pressure noted at this point I did clean up some of the callus today around the edges of the wound as well as the surface of the wound but to be honest he is progressing quite nicely. 10/30; the area on the left anterior lower tibia area is healed over. His edema control is adequate even though his use of the compression pumps seems very intermittent. He has a juxta lite stocking on the right leg and a think there is 1 for the left leg at home. His wife is putting these on. He has an area on the plantar right second toe which is a hammertoe they are trying to offload this 04/08/2019 on evaluation today patient appears to be doing well in regard to his lower extremities which is good news. We are seeing him today for his toe ulcer which is giving him a little bit more trouble but again still seems to be doing better at this point which is good news. He is going require some sharp debridement in regard to the toe today however. 04/15/2019 on evaluation today patient actually appears to be doing quite well with regard to his toe ulcer. I am very pleased with how things are doing in that regard. With regard to his lower extremity edema in general he tells me he is not been using the lymphedema pumps regularly although he does not use them. I think that he needs to use them more regularly he does have a small blister on the left lower extremity this is not open at this point but obviously it something that can get worse if he does not keep the compression going and the pumps going as well. 04/22/2019 on evaluation today patient appears to be doing well with regard to his toe ulcer. He has been tolerating the dressing changes without complication. This is measuring slightly smaller this week as compared to last week. Fortunately there is no signs of active infection at this  time. 05/06/2019 on evaluation today patient actually appears to be doing excellent. In fact his toe appears to be completely healed which is great news. There is no signs of active infection at this time. Readmission: Patient presents today for follow-up after having been discharged at the beginning of December 2020. He states that in recent weeks things have reopened and has been having more trouble at this point. With that being said fortunately there is no signs of active infection at this time. No fever chills noted. Nonetheless he also does have an issue with one of his toes as well that seems to be somewhat this is on the right foot second toe. 07/25/2019 upon evaluation today patient's lower extremities appear to be doing excellent bilaterally. I really  feel like he is showing signs of great improvement and in fact I think he is close to healing which is great news. There is no signs of active infection at this time. No fevers, chills, nausea, vomiting, or diarrhea. 07/31/2019 upon evaluation today patient appears to be doing excellent and in fact I think may be completely healed in regard to his right lower extremity that were still to monitor this 1 more week before closing it out. The left lower extremity is measuring smaller although not completely closed seems to be doing excellent. 08/07/2019 upon evaluation today patient appears to be doing excellent in regard to his wounds. In fact the right lower extremity is completely healed there is no issues here. The left lower extremity has 1 very small area still remaining fortunately there is no signs of infection and overall I think he is very close to closure of the here as well. 08/14/19 upon evaluation today patient appears to be doing excellent in regard to his lower extremities. In fact he appears to be completely healed as of today. Fortunately there's no signs of active infection at this time. No fevers, chills, nausea, or vomiting noted  at this time. READMISSION 09/26/2019 This is a 75 year old man who is well-known to this clinic having just been discharged on 08/14/2019. He has known lymphedema and chronic venous insufficiency. He has Farrow wrap stockings and also external compression pumps. He does not use the external compression pumps however. He does however apparently fairly faithfully use the stockings. They have not been lotion in his legs. He has developed new wounds on the right anterior as well as the left medial left lateral and left posterior calf. He returns for our review of this Past medical history includes coronary artery disease, hypertension, congestive heart failure, COPD, venous insufficiency with lymphedema, type 2 diabetes. He apparently has compression pumps, Farrow wraps and at 1 point a hospital bed although I believe he sleeps in a recliner 10/03/2019 upon evaluation today patient appears to be doing better with regard to his wounds in general. He has been tolerating the dressing changes without complication. Fortunately there is no signs of active infection. No fevers, chills, nausea, vomiting, or diarrhea. I do believe the compression wraps are helping as they always have in the past. 10/10/2019 upon evaluation today patient appears to be doing excellent at this point. His right lower extremity is measuring much better and in fact is completely healed. His left lower extremity is also measuring better though not healed seems to be doing excellent at this point which is great news. Overall I am very pleased with how things appear currently. 10/27/2019 upon evaluation today patient appears to be doing quite well with regard to his wounds currently. He has been tolerating the dressing changes without complication. Fortunately there is no signs of active infection at this time. In fact he is almost completely healed on the left and has just a very small area open and on the right he is completely  healed. 11/04/2019 upon evaluation today patient appears to be doing quite well with regard to his wounds. In fact he appears to be quite possibly completely healed on the left lower extremity now as well the right lower extremity is still doing well. With that being said unfortunately though this may be healed I think it is a very fragile area and I cannot even confirm there is not a very small opening still remaining. I really think he may benefit from 1 additional  weeks wrapping before discontinuing. 11/10/2019 upon evaluation today patient appears to be doing well in regard to his original wounds in fact everything that we were treating last week is completely healed on the left. Unfortunately he has a new skin tear just above where the wrap slipped down due to a fall he sustained causing this injury. 11/17/2019 on evaluation today patient appears to be doing better with regard to his wound. He has been tolerating the dressing changes without ERICSON, NAFZIGER. ([id number]) complication. Fortunately there is no signs of active infection at this time. No fevers, chills, nausea, vomiting, or diarrhea. 11/24/2019 upon evaluation today patient appears to be doing well with regard to his wound. This is making good progress there does not appear to be signs of active infection but overall I do feel like he is headed in the correct direction. Overall he is tolerating the compression wrap as well. 12/02/2019 upon evaluation today patient appears to be doing well with regard to his leg ulcer. In fact this appears to be completely healed and this is the last of the wounds that he had but still the left anterior lower extremity. Fortunately there is no signs of active infection at this time. No fevers, chills, nausea, vomiting, or diarrhea. Readmission: 02/05/2020 on evaluation today patient appears to be doing well with regard to his wound all things considered. He actually tells me that a couple weeks ago he had  trouble with his wraps and therefore took him off for a couple of days in order to give his legs a break. It was during this time that he actually developed increased swelling and edema of his legs and blisters on both legs. That is when he called to make the appointment. Nonetheless since that time he began wearing his compression wraps which are Velcro in the meantime and has done extremely well with this. In fact his leg appears to be under good control as far as edema is concerned today it is nothing like what I am seeing when he has come in for evaluations in the past. They have been using some of the silver alginate at home which has been beneficial for him. Fortunately there is no signs of active infection at this time. No fevers, chills, nausea, vomiting, or diarrhea. 02/19/2020 on evaluation today patient unfortunately does not appear to be doing quite as well as I would like to see. He has no signs of active infection at this time but unfortunately he is not doing well in regard to the overall appearance of his left leg. He has a couple other areas that are weeping now and the leg is much more swollen. I think that he needs to actually have a compression wrap to try to manage this. 02/26/2020 on evaluation today patient appears to be doing well with regard to his left lower extremity ulcer. Fortunately the swelling is down I do believe the pressure wrap seems to be doing quite well. Fortunately there is no signs of active infection at this time. 03/05/2020 upon evaluation today patient appears to be doing excellent in regard to his legs at this point. Fortunately there is no signs of active infection which is great news. Overall he feels like he is completely healed which is great news. Readmission: 05/20/2020 upon evaluation today patient appears to be doing well with regard to his leg ulcer all things considered. He is actually coming back to see [place] after having had a reopening of the ulcers  on his  left leg in the past several weeks. He is out of dressing supplies and his wife is been try to take care of this as best she can. 06/10/2020 upon evaluation today patient unfortunately appears to be doing significantly worse today compared to when I last saw him. He and his wife both tell me that "there has been a lot going on". I did not actually go into detail about everything that has been happening but nonetheless he has not obviously been wearing his compression. He brings them with him today but they were not on. I am not even certain to be honest that he could get those on at this point. Nonetheless I do believe that he is going to require compression wrapping at some point shortly but right now we need to try to get what appears to be infection under control at this time. 06/28/2020 upon evaluation today patient appears to be doing well with regard to 06/28/2020 upon evaluation today patient appears to be doing well pretty well in regard to his left leg which is much better than previous. With that being said in regard to his right leg he does seem to be having some issues with a new wound after he sustained a fall. This fortunately is not too deep but does appear to be something we need to address. Neither deep which is good. 07/05/2020 on evaluation today patient appears to be doing well with regard to his wounds. In fact everything on the left leg appears to be pretty much healed on the right leg this is very close though not completely closed yet. Fortunately there is no evidence of active infection at this time. No fevers, chills, nausea, vomiting, or diarrhea. 07/13/2019 upon evaluation today patient appears to be making good progress which is great news. Overall I am extremely pleased with where things stand today. There does not appear to be any signs of active infection which is great news and overall his left leg appears healed still the right leg though not healed does seem to be  making good progress which is excellent news. He is having no pain. 07/19/2020 on evaluation today patient appears to be doing well with regard to his wound. He has been tolerating the dressing changes without complication. Fortunately there does not appear to be any signs of active infection at this time. No fevers, chills, nausea, vomiting, or diarrhea. Readmission: 08/20/2020 on evaluation today patient presents for reevaluation here in the clinic concerning issues that he has been having with his bilateral lower extremities. Unfortunately this is an ongoing reoccurring issue. He tells me that he is really not certain exactly what caused the issue although as I discussed this further with him it appears that he has been sitting for the most part and sleeping in his chair with his feet on the ground he is not really elevating at all in fact the chair does not even have a reclining function therefore it is basically just him sitting with his feet on the ground. I think this alone has probably led to his increased swelling he is also having more difficulty walking which means that he is pumping less blood flow through his legs anyway as far as the venous stasis is concerned. All in all I think this is leading to a downward spiral of him not being able to walk very well as his legs are so heavy, they begin to weep, and overall he just does not seem to be doing as well as  it was when I last saw him. 08/27/2020 upon evaluation today patient's legs actually are showing signs of improvement which is great news. There does not appear to be any evidence of infection which is also excellent news. I am extremely pleased with where things stand today. No fevers, chills, nausea, vomiting, or diarrhea. 09/03/20 upon evaluation today patient appears to be doing excellent in regard to his wounds currently. He has been tolerating the dressing changes without complication and overall I am extremely pleased with where  things stand today. No fevers, chills, nausea, vomiting, or diarrhea. Overall he is healed on the right leg although the left leg not being completely healed still is doing significantly better. 09/10/2020/8/22 upon evaluation today patient appears to be doing well with regard to his wounds. He has been tolerating the dressing changes without complication. Fortunately he appears to be completely healed. I am very pleased in that regard. As is the patient his right leg is also doing well he did get a new recliner which is helping him keep his feet off the ground which I think is helped he is down 2 cm on the left compared to last week so I think there is some definite improvement here. [facility], [facility] ([id number]) Readmission 12/15/20: Mr. Kylian Loh is a 75 year old male with a past medical history of lymphedema, insulin-dependent type 2 diabetes, COPD and congestive heart failure that presents to the clinic for increased drainage from his legs bilaterally, Left greater than right. Patient has lymphedema. He has been seen in our clinic on multiple occasions for this issue. He currently denies signs of infection. He has not been using his lymphedema pumps. 12/23/2020 upon evaluation today patient appears to be doing well with regard to his wounds. He has been tolerating the dressing changes he really does not have any open wounds just weeping areas of the legs bilaterally and fortunately seems to be doing better at this point. 12/28/2020 upon evaluation today patient's wound is actually showing signs of good improvement. There does not appear to be any evidence of infection which is great news and overall I am extremely pleased at this point. I think is very close to complete resolution. 01/04/2021 upon evaluation today patient actually appears to be completely healed which is great news. I do not see any signs of active infection currently which is also great news. In general I am extremely pleased with  where we stand and I do believe that the patient is tolerating the dressing changes without complication. I believe he is ready to go back into his Velcro compression and I did see that they brought them with him today. Readmission: Patient presents today for follow-up he was last seen August 2. He is having some issues with some mild drainage and leaking from the left leg. This is something that has reopened and again is a recurrent issue for him. Subsequently he does wear his Velcro compression wraps though I think we may want to look into something a little bit better for him I am thinking that we may want to try the juxta fit compression which I think would do much more effective compression treatment overall for him. 04/08/2021 upon evaluation today patient appears to be doing well with regard to his legs. I do feel like he is showing signs of improvement which is great news. No fevers, chills, nausea, vomiting, or diarrhea. Electronic Signature(s) Signed: 04/08/2021 2:01:19 PM By: Worthy Keeler PA-C Entered By: Worthy Keeler on 04/08/2021 14:01:19  PHILMORE, LEPORE ([id number]) -------------------------------------------------------------------------------- Physical Exam Details Patient Name: Adam Merritt, Adam Merritt Date of Service: 04/08/2021 1:15 PM Medical Record Number: [id number] Patient Account Number: [id number] Date of Birth/Sex: Feb 02, 1946 (75 y.o. M) Treating RN: Carlene Coria Primary Care Provider: [name] Other Clinician: Referring Provider: [name] Treating Provider/Extender: Jeri Cos [name]: 2 Constitutional Obese and well-hydrated in no acute distress. Respiratory normal breathing without difficulty. Psychiatric this patient is able to make decisions and demonstrates good insight into disease process. Alert and Oriented x 3. pleasant and cooperative. Notes Upon inspection patient's wound bed actually showed signs of good granulation and epithelization at  this point. Fortunately there does not appear to be any evidence of active infection systemically which is great news. No fevers, chills, nausea, vomiting, or diarrhea. Electronic Signature(s) Signed: 04/08/2021 2:01:41 PM By: Worthy Keeler PA-C Entered By: Worthy Keeler on 04/08/2021 14:01:41 Adam Merritt ([id number]) -------------------------------------------------------------------------------- Physician Orders Details Patient Name: Adam Merritt Date of Service: 04/08/2021 1:15 PM Medical Record Number: [id number] Patient Account Number: [id number] Date of Birth/Sex: 1946-01-31 (75 y.o. M) Treating RN: Carlene Coria Primary Care Provider: [name] Other Clinician: Referring Provider: [name] Treating Provider/Extender: Skipper Cliche in Treatment: 2 Verbal / Phone Orders: No Diagnosis Coding ICD-10 Coding Code Description I89.0 Lymphedema, not elsewhere classified I87.2 Venous insufficiency (chronic) (peripheral) L97.812 Non-pressure chronic ulcer of other part of right lower leg with fat layer exposed L97.822 Non-pressure chronic ulcer of other part of left lower leg with fat layer exposed Follow-up Appointments o Return Appointment in 1 week. Bathing/ Shower/ Hygiene o May shower with wound dressing protected with water repellent cover or cast protector. Edema Control - Lymphedema / Segmental Compressive Device / Other o Elevate, Exercise Daily and Avoid Standing for Long Periods of Time. o Elevate legs to the level of the heart and pump ankles as often as possible o Elevate leg(s) parallel to the floor when sitting. Off-Loading o Turn and reposition every 2 hours Wound Treatment Wound #41 - Lower Leg Wound Laterality: Left, Circumferential Primary Dressing: Silvercel 4 1/4x 4 1/4 (in/in) 1 x Per Week/30 Days Discharge Instructions: Apply Silvercel 4 1/4x 4 1/4 (in/in) as instructed Secondary Dressing: ABD Pad 5x9 (in/in) 1 x Per Week/30  Days Discharge Instructions: Cover with ABD pad Compression Wrap: Medichoice 4 layer Compression System, 35-40 mmHG 1 x Per Week/30 Days Discharge Instructions: Apply multi-layer wrap as directed. Wound #42 - Lower Leg Wound Laterality: Left, Medial Primary Dressing: Silvercel 4 1/4x 4 1/4 (in/in) 1 x Per Week/30 Days Discharge Instructions: Apply Silvercel 4 1/4x 4 1/4 (in/in) as instructed Secondary Dressing: ABD Pad 5x9 (in/in) 1 x Per Week/30 Days Discharge Instructions: Cover with ABD pad Compression Wrap: Medichoice 4 layer Compression System, 35-40 mmHG 1 x Per Week/30 Days Discharge Instructions: Apply multi-layer wrap as directed. Electronic Signature(s) Signed: 04/08/2021 4:56:27 PM By: Worthy Keeler PA-C Signed: 05/11/2021 1:44:34 PM By: Carlene Coria RN Entered By: Carlene Coria on 04/08/2021 14:04:02 Adam Merritt ([id number]) -------------------------------------------------------------------------------- Problem List Details Patient Name: Adam Merritt Date of Service: 04/08/2021 1:15 PM Medical Record Number: [id number] Patient Account Number: [id number] Date of Birth/Sex: April 20, 1946 (75 y.o. M) Treating RN: Carlene Coria Primary Care Provider: [name] Other Clinician: Referring Provider: [name] Treating Provider/Extender: Skipper Cliche in Treatment: 2 Active Problems ICD-10 Encounter Code Description Active Date MDM Diagnosis I89.0 Lymphedema, not elsewhere classified 03/25/2021 No Yes I87.2 Venous insufficiency (chronic) (peripheral) 03/25/2021 No Yes L97.812 Non-pressure chronic ulcer  of other part of right lower leg with fat layer 03/25/2021 No Yes exposed L97.822 Non-pressure chronic ulcer of other part of left lower leg with fat layer 03/25/2021 No Yes exposed Inactive Problems Resolved Problems Electronic Signature(s) Signed: 04/08/2021 1:08:43 PM By: Worthy Keeler PA-C Entered By: Worthy Keeler on 04/08/2021 13:08:43 Adam Merritt  ([id number]) -------------------------------------------------------------------------------- Progress Note Details Patient Name: Adam Merritt Date of Service: 04/08/2021 1:15 PM Medical Record Number: [id number] Patient Account Number: [id number] Date of Birth/Sex: 1945-07-10 (75 y.o. M) Treating RN: Carlene Coria Primary Care Provider: [name] Other Clinician: Referring Provider: [name] Treating Provider/Extender: Skipper Cliche in Treatment: 2 Subjective Chief Complaint Information obtained from Patient Bilateral LE edema and weeping ulcers History of Present Illness (HPI) 10/18/17-He is here for initial evaluation of bilateral lower extremity ulcerations in the presence of venous insufficiency and lymphedema. He has been seen by vascular medicine in the past, Dr. Lucky Cowboy, last seen in 2016. He does have a history of abnormal ABIs, which is to be expected given his lymphedema and venous insufficiency. According to Epic, it appears that all attempts for arterial evaluation and/or angiography were not follow through with by patient. He does have a history of being seen in lymphedema clinic in 2018, stopped going approximately 6 months ago stating "it didn't do any good". He does not have lymphedema pumps, he does not have custom fit compression wrap/stockings. He is diabetic and his recent A1c last month was 7.6. He admits to chronic bilateral lower extremity pain, no change in pain since blister and ulceration development. He is currently being treated with Levaquin for bronchitis. He has home health and we will continue. 10/25/17-He is here in follow-up evaluation for bilateral lower extremity ulcerationssubtle he remains on Levaquin for bronchitis. Right lower extremity with no evidence of drainage or ulceration, persistent left lower extremity ulceration. He states that home health has not been out since his appointment. He went to [facility] on Tuesday, studies  revealed: RIGHT ABI 0.9, TBI 0.6 LEFT ABI 1.1, TBI 0.6 with triphasic flow bilaterally. We will continue his same treatment plan. He has been educated on compression therapy and need for elevation. He will benefit from lymphedema pumps 11/01/17-He is here in follow-up evaluation for left lower extremity ulcer. The right lower extremity remains healed. He has home health services, but they have not been out to see the patient for 2-3 weeks. He states it home health physical therapy changed his dressing yesterday after therapy; he placed Ace wrap compression. We are still waiting for lymphedema pumps, reordered d/t need for company change. 11/08/17-He is here in follow-up evaluation for left lower extremity ulcer. It is improved. Edema is significantly improved with compression therapy. We will continue with same treatment plan and he will follow-up next week. No word regarding lymphedema pumps 11/15/17-He is here in follow-up evaluation for left lower extremity ulcer. He is healed and will be discharged from wound care services. I have reached out to [facility] regarding his lymphedema pumps. They have been unable to reach the patient; the contact number they had with the patient's wife's cell phone and she has not answered any unrecognized calls. Contact should be made today, trial planned for next week; [facility] will continue to follow 11/27/17 on evaluation today patient has multiple blistered areas over the right lower extremity his left lower extremity appears to be doing okay. These blistered areas show signs of no infection which is great news. With  that being said he did have some necrotic skin overlying which was mechanically debrided away with saline and gauze today without complication. Overall post debridement the wounds appear to be doing better but in general his swelling seems to be increased. This is obviously not good news. I think this is what has given rise to the  blisters. 12/04/17 on evaluation today patient presents for follow-up concerning his bilateral lower extremity edema in the right lower extremity ulcers. He has been tolerating the dressing changes without complication. With that being said he has had no real issues with the wraps which is also good news. Overall I'm pleased with the progress he's been making. 12/11/17 on evaluation today patient appears to be doing rather well in regard to his right lateral lower extremity ulcer. He's been tolerating the dressing changes without complication. Fortunately there does not appear to be any evidence of infection at this time. Overall I'm pleased with the progress that is being made. Unfortunately he has been in the hospital due to having what sounds to be a stomach virus/flu fortunately that is starting to get better. 12/18/17 on evaluation today patient actually appears to be doing very well in regard to his bilateral lower extremities the swelling is under fairly good control his lymphedema pumps are still not up and running quite yet. With that being said he does have several areas of opening noted as far as wounds are concerned mainly over the left lower extremity. With that being said I do believe once he gets lymphedema pumps this would least help you mention some the fluid and preventing this from occurring. Hopefully that will be set up soon sleeves are Artie in place at his home he just waiting for the machine. 12/25/17 on evaluation today patient actually appears to be doing excellent in fact all of his ulcers appear to have resolved his legs appear very well. I do think he needs compression stockings we have discussed this and they are actually going to go to [place] today to elastic therapy to get this fitted for him. I think that is definitely a good thing to do. Readmission: 04/09/18 upon evaluation today this patient is seen for readmission due to bilateral lower extremity lymphedema. He has  significant swelling of his extremities especially on the left although the right is also swollen he has weeping from both sides. There are no obvious open wounds at this point. Fortunately he has been doing fairly well for quite a bit of time since I last saw him. Nonetheless unfortunately this seems to have reopened and is giving quite a bit of trouble. He states this began about a week ago when he first called [place] to get in to be seen. No fevers, chills, nausea, or vomiting noted at this time. He has not been using his lymphedema pumps due to the fact that they won't fit on his leg at this point likewise is also not been using his compression for essentially the same reason. 04/16/18 upon evaluation today patient actually appears to be doing a little better in regard to the fluid in his bilateral lower extremities. With that being said he's had three falls since I saw him last week. He also states that he's been feeling very poorly. I was concerned last week and feel like that the concern is still there as far as the congestion in his chest is concerned he seems to be breathing about the same as last week but again he states he's very weak  he's not even able to walk further than from the chair to the door. His wife had to buy a wheelchair just to be able to get them out of the house to get to the appointment today. This has me very concerned. AUSENCIO, VADEN ([id number]) 04/23/18 on evaluation today patient actually appears to be doing much better than last week's evaluation. At that time actually had to transport him to the ER via EMS and he subsequently was admitted for acute pulmonary edema, acute renal failure, and acute congestive heart failure. Fortunately he is doing much better. Apparently they did dialyze him and were able to take off roughly 35 pounds of fluid. Nonetheless he is feeling much better both in regard to his breathing and he's able to get around much better at this time compared  to previous. Overall I'm very happy with how things are at this time. There does not appear to be any evidence of infection currently. No fevers, chills, nausea, or vomiting noted at this time. 04/30/2018 patient seen today for follow-up and management of bilateral lower extremity lymphedema. He did express being more sad today than usual due to the recent loss of his dog. He states that he has been compliant with using the lymphedema pumps. However he does admit a minute over the last 2-3 days he has not been using the pumps due to the recent loss of his dog. At this time there is no drainage or open wounds to his lower extremities. The left leg edema is measuring smaller today. Still has a significant amount of edema on bilateral lower extremities With dry flaky skin. He will be referred to the lymphedema clinic for further management. Will continue 3 layer compression wraps and follow-up in 1-2 weeks.Denies any pain, fever, chairs recently. No recent falls or injuries reported during this visit. 05/07/18 on evaluation today patient actually appears to be doing very well in regard to his lower extremities in general all things considered. With that being said he is having some pain in the legs just due to the amount of swelling. He does have an area where he had a blister on the left lateral lower extremity this is open at this point other than that there's nothing else weeping at this time. 05/14/18 on evaluation today patient actually appears to be doing excellent all things considered in regard to his lower extremities. He still has a couple areas of weeping on each leg which has continued to be the issue for him. He does have an appointment with the lymphedema clinic although this isn't until February 2020. That was the earliest they had. In the meantime he has continued to tolerate the compression wraps without complication. 05/28/18 on evaluation today patient actually appears to be doing more  poorly in regard to his left lower extremity where he has a wound open at this point. He also had a fall where he subsequently injured his right great toe which has led to an open wound at the site unfortunately. He has been tolerating the dressing changes without complication in general as far as the wraps are concerned that he has not been putting any dressing on the left 1st toe ulcer site. 06/11/18 on evaluation today patient appears to be doing much worse in regard to his bilateral lower extremity ulcers. He has been tolerating the dressing changes without complication although his legs have not been wrapped more recently. Overall I am not very pleased with the way his legs appear. I do  believe he needs to be back in compression wraps he still has not received his compression wraps from the [facility] hospital as of yet. 06/18/18 on evaluation today patient actually appears to be doing significantly better than last time I saw him. He has been tolerating the compression wraps without complication in the circumferential ulcers especially appear to be doing much better. His toe ulcer on the right in regard to the great toe is better although not as good as the legs in my pinion. No fevers chills noted 07/02/18 on evaluation today patient appears to be doing much better in regard to his lower extremity ulcers. Unfortunately since I last saw him he's had the distal portion of his right great toe if he dated it sounds as if this actually went downhill very quickly. I had only seen him a few days prior and the toe did not appear to be infected at that point subsequently became infected very rapidly and it was decided by the surgeon that the distal portion of the toe needed to be removed. The patient seems to be doing well in this regard he tells me. With that being said his lower extremities are doing better from the standpoint of the wounds although he is significantly swollen at this point. 07/09/18 on evaluation  today patient appears to be doing better in regard to the wounds on his lower extremities. In fact everything is almost completely healed he is just a small area on the left posterior lower extremity that is open at this point. 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 With that being said he needs some kind of compression it does sound like he may not be wearing his compression, that is the wraps, during the entire time between when he's here visit to visit. Apparently his wife took the current one off because it began to "fall apart". 07/16/18 on evaluation today patient appears to be extremely swollen especially in regard to his left lower extremity unfortunately. He also has a new skin tear over the left lower extremity and there's a smaller area on the right lower extremity as well. Unfortunately this seems to be due in part to blistering and fluid buildup in his leg. He did get the reduction wraps that were ordered by the [facility] hospital for him to go to lymphedema clinic. With that being said his wounds on the legs have not healed to the point to where they would likely accept them as a patient lymphedema clinic currently. We need to try to get this to heal. With that being said he's been taking his wraps off which is not doing him any favors at this point. In fact this is probably quite counterproductive compared to what needs to occur. We will likely need to increase to a four layer compression wrap and continue to also utilize elevation and he has to keep the wraps on not take them off as he's been doing currently hasn't had a wrap on since Saturday. 07/23/18 on evaluation today patient appears to be doing much better in regard to his bilateral lower extremities. In fact his left lower extremity which was the  largest is actually 15 cm smaller today compared to what it was last time he was here in our clinic. This is obviously good news after just one week. Nonetheless the  differences he actually kept the wraps on during the entire week this time. That's not typical for him. I do believe he understands a little bit better now the severity of the situation and why it's important for him to keep these wraps on. 07/30/18 on evaluation today patient actually appears to be doing rather well in regard to his lower extremities. His legs are much smaller than they have been in the past and he actually has only one very small rudder superficial region remaining that is not closed on the left lateral/posterior lower extremity even this is almost completely close. I do believe likely next week he will be healed without any complications. I do think we need to continue the wraps however this seems to be beneficial for him. I also think it may be a good time for [place] to go ahead and see about getting the appointment with the lymphedema clinic which is supposed to be made for him in order to keep this moving along and hopefully get them into compression wraps that will in the end help him to remain healed. 08/06/18 on evaluation today patient actually appears to be doing very well in regard as bilateral lower extremities. In fact his wounds appear to be completely healed at this time. 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 He is in the process of getting appointment with the lymphedema clinic we have made this referral were just waiting to hear back on the schedule time. We need to follow up on that today as well. 08/13/18 on evaluation today patient actually appears to be doing very well in regard to his bilateral lower extremities there are no open wounds at this point. We are gonna go ahead and see about ordering the Velcro compression wraps for him had a  discussion with them about [place] doing it versus the [place] they feel like they can definitely afford going ahead and get the wraps themselves and they would prefer to try to avoid having to go to the lymphedema clinic if it all possible which I completely understand. As long as he has good compression I'm okay either way. BART, ASHFORD ([id number]) 3/17//20 on evaluation today patient actually appears to be doing well in regard to his bilateral lower extremity ulcers. He has been tolerating the dressing changes without complication specifically the compression wraps. Overall is had no issues my fingers finding that I see at this point is that he is having trouble with constipation. He tells me he has not been able to go to the bathroom for about six days. He's taken to over-the- counter oral laxatives unfortunately this is not helping. He has not contacted his doctor. 08/27/18 on evaluation today patient appears to be doing fairly well in regard to his lower extremities at this point. There does not appear to be any new altars is swelling is very well controlled. We are still waiting for his Velcro compression wraps to arrive that should be sometime in the next week is Artie sent the check for these. 09/03/18 on evaluation today patient appears to be doing excellent in regard to his bilateral lower extremities which he shows no signs of wound openings in regard to this point. He does have his Velcro compression wraps which did arrive in the mail since I saw him last week. Overall he is doing excellent in my pinion. 09/13/18 on evaluation today patient appears to be doing very well currently in regard to the  overall appearance of his bilateral lower extremities although he's a little bit more swollen than last time we saw him. At that point he been discharged without any open wounds. Nonetheless he has a small open wound on the posterior left lower extremity with some evidence of cellulitis noted as well.  Fortunately I feel like he has made good progress overall with regard to his lower extremities from were things used to be. 09/20/18 on evaluation today patient actually appears to be doing much better. The erythematous lower extremity is improving wound itself which is still open appears to be doing much better as far as but appearance as well as pain is concerned overall very pleased in this regard. There's no signs of active infection at this time. 09/27/18 on evaluation today patient's wounds on the lower extremity actually appear to be doing fairly well at this time which is good news. There is no evidence of active infection currently and again is just as left lower extremity were there any wounds at this point anyway. I believe they may be completely healed but again I'm not 100% sure based on evaluation today. I think one more week of observation would likely be a good idea. 10/04/18 on evaluation today patient actually appears to be doing excellent in regard to the left lower extremity which actually appears to be completely healed as of today. Unfortunately he's been having issues with his right lower extremity have a new wound that has opened. Fortunately there's no evidence of active infection at this time which is good news. 10/10/18 upon evaluation today patient actually appears to be doing a little bit worse with the new open area on his right posterior lower extremity. He's been tolerating the dressing changes without complication. Right now we been using his compression wraps although I think we may need to switch back to actually performing bilateral compression wraps are in the clinic. No fevers, chills, nausea, or vomiting noted at this time. 10/17/18 on evaluation today patient actually appears to be doing quite well in regard to his bilateral lower extremity ulcers. He has been tolerating the reps without complication although he would prefer not to rewrap his legs as of today.  Fortunately there's no signs of active infection which is good news. No fevers, chills, nausea, or vomiting noted at this time. 10/24/18 on evaluation today patient appears to be doing very well in regard to his lower extremities. His right lower extremity is shown signs of healing and his left lower Trinity though not healed appears to be improving which is excellent news. Overall very pleased with how things seem to be progressing at this time. The patient is likewise happy to hear this. His Velcro compression wraps however have not been put on properly will gonna show his wife how to do that properly today. 10/31/18 on evaluation today patient appears to be doing more poorly in regard to his left lower extremity in particular although both lower extremities actually are showing some signs of being worse than my previous evaluation. Unfortunately I'm just not sure that his compression stockings even with the use of the compression/lymphedema pumps seem to be controlling this well. Upon further questioning he tells me that he also is not able to lie flat in the bed due to his congestive heart failure and difficulty breathing. For that reason he sleeps in his recliner. To make matters worse his recliner also cannot even hold his legs up so instead of even being somewhat elevated they pretty  much hang down to the floor. This is the way he sleeps each night which is definitely counterproductive to everything else that were attempting to do from the standpoint of controlling his fluid. Nonetheless I think that he potentially could benefit from a hospital bed although this would be something that his primary care provider would likely have to order since anything that is order on our side has to be directly related to wound care and again the hospital bed is not necessarily a direct relation to although I think it does contribute to his overall wound status on his lower extremities. 11/07/18 on evaluation  today patient appears to be doing better in regard to his bilateral lower extremity. He's been tolerating the dressing changes without complication. We did in the interim since I last saw him switch to just using extras orbiting new alginate and that seems to have done much better for him. I'm very pleased with the overall progress that is made. 11/14/18 on evaluation today patient appears to be redoing rather well in regard to his left lower extremity ulcers which are the only ones remaining at this point. Fortunately there's no signs of active infection at this time which is good news. Overall been very pleased with how things seem to be progressing currently. No fevers, chills, nausea, or vomiting noted at this time. 11/20/17 on evaluation today patient actually appears to be doing quite well in regard to his lower extremities on the left on the right he has several blisters that showed up although there's some question about whether or not he has had his broker compression wraps on like he was supposed to or not. Fortunately there's no signs of active infection at this time which is good news. Unfortunately though he is doing well from the standpoint of the left leg the right leg is not doing as well again this is when we did not wrap last week. 11/28/18 on evaluation today patient appears to be doing well in regard to his bilateral lower extremities is left appears to be healed is right is not healed but is very close to being so. Overall very pleased with how things seem to be progressing. Patient is likewise happy that things are doing well. 12/09/18 on evaluation today patient actually appears to be completely healed which is excellent news. He actually seems to be doing well in regard to the swelling of the bilateral lower extremities which is also great news. Overall very pleased with how things seem to be progressing. Readmission: 03/18/2019 upon evaluation today patient presents for  reevaluation concerning issues that he is having with his left lower extremity where he does have a wound noted upon inspection today. He also has a wound on the right second toe on the tip where it is apparently been rubbing in his shoe I did look at issues and you can actually see where this has been occurring as well. Fortunately there is no signs of infection with regard to BROOX, Adam Merritt. ([id number]) the toe necessarily although it is very swollen compared to normal that does have me somewhat concerned about the possibility of further evaluation for infection/osteomyelitis. I would recommend an x-ray to start with. Otherwise he states that it is been 1-2 weeks that he has had the draining in regard to left lower extremity he does not know how this happened he has been wearing his compression appropriately and I do see that as well today but nonetheless I do think that he needs to  continue to be very cautious with regard to elevation as well. 03/25/2019 on evaluation today patient appears to be doing much better in regard to his lower extremity at this time. That is on the left. He did culture positive for Pseudomonas but the good news is he seems to be doing much better the gentamicin we applied topically seems to have done a great job for him. There is no signs of active infection at this time systemically and locally he is doing much better. No fevers, chills, nausea, vomiting, or diarrhea. In regard to his right toe this seems to be doing much better there is very little pressure noted at this point I did clean up some of the callus today around the edges of the wound as well as the surface of the wound but to be honest he is progressing quite nicely. 10/30; the area on the left anterior lower tibia area is healed over. His edema control is adequate even though his use of the compression pumps seems very intermittent. He has a juxta lite stocking on the right leg and a think there is 1 for the  left leg at home. His wife is putting these on. He has an area on the plantar right second toe which is a hammertoe they are trying to offload this 04/08/2019 on evaluation today patient appears to be doing well in regard to his lower extremities which is good news. We are seeing him today for his toe ulcer which is giving him a little bit more trouble but again still seems to be doing better at this point which is good news. He is going require some sharp debridement in regard to the toe today however. 04/15/2019 on evaluation today patient actually appears to be doing quite well with regard to his toe ulcer. I am very pleased with how things are doing in that regard. With regard to his lower extremity edema in general he tells me he is not been using the lymphedema pumps regularly although he does not use them. I think that he needs to use them more regularly he does have a small blister on the left lower extremity this is not open at this point but obviously it something that can get worse if he does not keep the compression going and the pumps going as well. 04/22/2019 on evaluation today patient appears to be doing well with regard to his toe ulcer. He has been tolerating the dressing changes without complication. This is measuring slightly smaller this week as compared to last week. Fortunately there is no signs of active infection at this time. 05/06/2019 on evaluation today patient actually appears to be doing excellent. In fact his toe appears to be completely healed which is great news. There is no signs of active infection at this time. Readmission: Patient presents today for follow-up after having been discharged at the beginning of December 2020. He states that in recent weeks things have reopened and has been having more trouble at this point. With that being said fortunately there is no signs of active infection at this time. No fever chills noted. Nonetheless he also does have an issue  with one of his toes as well that seems to be somewhat this is on the right foot second toe. 07/25/2019 upon evaluation today patient's lower extremities appear to be doing excellent bilaterally. I really feel like he is showing signs of great improvement and in fact I think he is close to healing which is great news.  There is no signs of active infection at this time. No fevers, chills, nausea, vomiting, or diarrhea. 07/31/2019 upon evaluation today patient appears to be doing excellent and in fact I think may be completely healed in regard to his right lower extremity that were still to monitor this 1 more week before closing it out. The left lower extremity is measuring smaller although not completely closed seems to be doing excellent. 08/07/2019 upon evaluation today patient appears to be doing excellent in regard to his wounds. In fact the right lower extremity is completely healed there is no issues here. The left lower extremity has 1 very small area still remaining fortunately there is no signs of infection and overall I think he is very close to closure of the here as well. 08/14/19 upon evaluation today patient appears to be doing excellent in regard to his lower extremities. In fact he appears to be completely healed as of today. Fortunately there's no signs of active infection at this time. No fevers, chills, nausea, or vomiting noted at this time. READMISSION 09/26/2019 This is a 75 year old man who is well-known to this clinic having just been discharged on 08/14/2019. He has known lymphedema and chronic venous insufficiency. He has Farrow wrap stockings and also external compression pumps. He does not use the external compression pumps however. He does however apparently fairly faithfully use the stockings. They have not been lotion in his legs. He has developed new wounds on the right anterior as well as the left medial left lateral and left posterior calf. He returns for our review of  this Past medical history includes coronary artery disease, hypertension, congestive heart failure, COPD, venous insufficiency with lymphedema, type 2 diabetes. He apparently has compression pumps, Farrow wraps and at 1 point a hospital bed although I believe he sleeps in a recliner 10/03/2019 upon evaluation today patient appears to be doing better with regard to his wounds in general. He has been tolerating the dressing changes without complication. Fortunately there is no signs of active infection. No fevers, chills, nausea, vomiting, or diarrhea. I do believe the compression wraps are helping as they always have in the past. 10/10/2019 upon evaluation today patient appears to be doing excellent at this point. His right lower extremity is measuring much better and in fact is completely healed. His left lower extremity is also measuring better though not healed seems to be doing excellent at this point which is great news. Overall I am very pleased with how things appear currently. 10/27/2019 upon evaluation today patient appears to be doing quite well with regard to his wounds currently. He has been tolerating the dressing changes without complication. Fortunately there is no signs of active infection at this time. In fact he is almost completely healed on the left and has just a very small area open and on the right he is completely healed. 11/04/2019 upon evaluation today patient appears to be doing quite well with regard to his wounds. In fact he appears to be quite possibly completely healed on the left lower extremity now as well the right lower extremity is still doing well. With that being said unfortunately though this may be healed I think it is a very fragile area and I cannot even confirm there is not a very small opening still remaining. I really think he may benefit from 1 additional weeks wrapping before discontinuing. DAEMIEN, FRONCZAK ([id number]) 11/10/2019 upon evaluation today patient  appears to be doing well in regard to his original  wounds in fact everything that we were treating last week is completely healed on the left. Unfortunately he has a new skin tear just above where the wrap slipped down due to a fall he sustained causing this injury. 11/17/2019 on evaluation today patient appears to be doing better with regard to his wound. He has been tolerating the dressing changes without complication. Fortunately there is no signs of active infection at this time. No fevers, chills, nausea, vomiting, or diarrhea. 11/24/2019 upon evaluation today patient appears to be doing well with regard to his wound. This is making good progress there does not appear to be signs of active infection but overall I do feel like he is headed in the correct direction. Overall he is tolerating the compression wrap as well. 12/02/2019 upon evaluation today patient appears to be doing well with regard to his leg ulcer. In fact this appears to be completely healed and this is the last of the wounds that he had but still the left anterior lower extremity. Fortunately there is no signs of active infection at this time. No fevers, chills, nausea, vomiting, or diarrhea. Readmission: 02/05/2020 on evaluation today patient appears to be doing well with regard to his wound all things considered. He actually tells me that a couple weeks ago he had trouble with his wraps and therefore took him off for a couple of days in order to give his legs a break. It was during this time that he actually developed increased swelling and edema of his legs and blisters on both legs. That is when he called to make the appointment. Nonetheless since that time he began wearing his compression wraps which are Velcro in the meantime and has done extremely well with this. In fact his leg appears to be under good control as far as edema is concerned today it is nothing like what I am seeing when he has come in for evaluations in the  past. They have been using some of the silver alginate at home which has been beneficial for him. Fortunately there is no signs of active infection at this time. No fevers, chills, nausea, vomiting, or diarrhea. 02/19/2020 on evaluation today patient unfortunately does not appear to be doing quite as well as I would like to see. He has no signs of active infection at this time but unfortunately he is not doing well in regard to the overall appearance of his left leg. He has a couple other areas that are weeping now and the leg is much more swollen. I think that he needs to actually have a compression wrap to try to manage this. 02/26/2020 on evaluation today patient appears to be doing well with regard to his left lower extremity ulcer. Fortunately the swelling is down I do believe the pressure wrap seems to be doing quite well. Fortunately there is no signs of active infection at this time. 03/05/2020 upon evaluation today patient appears to be doing excellent in regard to his legs at this point. Fortunately there is no signs of active infection which is great news. Overall he feels like he is completely healed which is great news. Readmission: 05/20/2020 upon evaluation today patient appears to be doing well with regard to his leg ulcer all things considered. He is actually coming back to see [place] after having had a reopening of the ulcers on his left leg in the past several weeks. He is out of dressing supplies and his wife is been try to take care of this  as best she can. 06/10/2020 upon evaluation today patient unfortunately appears to be doing significantly worse today compared to when I last saw him. He and his wife both tell me that "there has been a lot going on". I did not actually go into detail about everything that has been happening but nonetheless he has not obviously been wearing his compression. He brings them with him today but they were not on. I am not even certain to be honest that he  could get those on at this point. Nonetheless I do believe that he is going to require compression wrapping at some point shortly but right now we need to try to get what appears to be infection under control at this time. 06/28/2020 upon evaluation today patient appears to be doing well with regard to 06/28/2020 upon evaluation today patient appears to be doing well pretty well in regard to his left leg which is much better than previous. With that being said in regard to his right leg he does seem to be having some issues with a new wound after he sustained a fall. This fortunately is not too deep but does appear to be something we need to address. Neither deep which is good. 07/05/2020 on evaluation today patient appears to be doing well with regard to his wounds. In fact everything on the left leg appears to be pretty much healed on the right leg this is very close though not completely closed yet. Fortunately there is no evidence of active infection at this time. No fevers, chills, nausea, vomiting, or diarrhea. 07/13/2019 upon evaluation today patient appears to be making good progress which is great news. Overall I am extremely pleased with where things stand today. There does not appear to be any signs of active infection which is great news and overall his left leg appears healed still the right leg though not healed does seem to be making good progress which is excellent news. He is having no pain. 07/19/2020 on evaluation today patient appears to be doing well with regard to his wound. He has been tolerating the dressing changes without complication. Fortunately there does not appear to be any signs of active infection at this time. No fevers, chills, nausea, vomiting, or diarrhea. Readmission: 08/20/2020 on evaluation today patient presents for reevaluation here in the clinic concerning issues that he has been having with his bilateral lower extremities. Unfortunately this is an ongoing  reoccurring issue. He tells me that he is really not certain exactly what caused the issue although as I discussed this further with him it appears that he has been sitting for the most part and sleeping in his chair with his feet on the ground he is not really elevating at all in fact the chair does not even have a reclining function therefore it is basically just him sitting with his feet on the ground. I think this alone has probably led to his increased swelling he is also having more difficulty walking which means that he is pumping less blood flow through his legs anyway as far as the venous stasis is concerned. All in all I think this is leading to a downward spiral of him not being able to walk very well as his legs are so heavy, they begin to weep, and overall he just does not seem to be doing as well as it was when I last saw him. 08/27/2020 upon evaluation today patient's legs actually are showing signs of improvement which is great news.  There does not appear to be any evidence of infection which is also excellent news. I am extremely pleased with where things stand today. No fevers, chills, nausea, vomiting, or diarrhea. 09/03/20 upon evaluation today patient appears to be doing excellent in regard to his wounds currently. He has been tolerating the dressing changes without complication and overall I am extremely pleased with where things stand today. No fevers, chills, nausea, vomiting, or diarrhea. Overall he is healed on the right leg although the left leg not being completely healed still is doing significantly better. JORGELUIS, Adam Merritt ([id number]) 09/10/2020/8/22 upon evaluation today patient appears to be doing well with regard to his wounds. He has been tolerating the dressing changes without complication. Fortunately he appears to be completely healed. I am very pleased in that regard. As is the patient his right leg is also doing well he did get a new recliner which is helping him keep  his feet off the ground which I think is helped he is down 2 cm on the left compared to last week so I think there is some definite improvement here. Readmission 12/15/20: Mr. Anthonny Schiller is a 75 year old male with a past medical history of lymphedema, insulin-dependent type 2 diabetes, COPD and congestive heart failure that presents to the clinic for increased drainage from his legs bilaterally, Left greater than right. Patient has lymphedema. He has been seen in our clinic on multiple occasions for this issue. He currently denies signs of infection. He has not been using his lymphedema pumps. 12/23/2020 upon evaluation today patient appears to be doing well with regard to his wounds. He has been tolerating the dressing changes he really does not have any open wounds just weeping areas of the legs bilaterally and fortunately seems to be doing better at this point. 12/28/2020 upon evaluation today patient's wound is actually showing signs of good improvement. There does not appear to be any evidence of infection which is great news and overall I am extremely pleased at this point. I think is very close to complete resolution. 01/04/2021 upon evaluation today patient actually appears to be completely healed which is great news. I do not see any signs of active infection currently which is also great news. In general I am extremely pleased with where we stand and I do believe that the patient is tolerating the dressing changes without complication. I believe he is ready to go back into his Velcro compression and I did see that they brought them with him today. Readmission: Patient presents today for follow-up he was last seen August 2. He is having some issues with some mild drainage and leaking from the left leg. This is something that has reopened and again is a recurrent issue for him. Subsequently he does wear his Velcro compression wraps though I think we may want to look into something a little bit  better for him I am thinking that we may want to try the juxta fit compression which I think would do much more effective compression treatment overall for him. 04/08/2021 upon evaluation today patient appears to be doing well with regard to his legs. I do feel like he is showing signs of improvement which is great news. No fevers, chills, nausea, vomiting, or diarrhea. Objective Constitutional Obese and well-hydrated in no acute distress. Vitals Time Taken: 1:38 PM, Height: 66 in, Weight: 300 lbs, BMI: 48.4, Temperature: 99.1 F, Pulse: 70 bpm, Respiratory Rate: 20 breaths/min, Blood Pressure: 157/74 mmHg. Respiratory normal breathing without difficulty. Psychiatric  this patient is able to make decisions and demonstrates good insight into disease process. Alert and Oriented x 3. pleasant and cooperative. General Notes: Upon inspection patient's wound bed actually showed signs of good granulation and epithelization at this point. Fortunately there does not appear to be any evidence of active infection systemically which is great news. No fevers, chills, nausea, vomiting, or diarrhea. Integumentary (Hair, Skin) Wound #41 status is Open. Original cause of wound was Gradually Appeared. The date acquired was: 03/05/2021. The wound has been in treatment 2 weeks. The wound is located on the Left,Circumferential Lower Leg. The wound measures 11cm length x 10cm width x 0.1cm depth; 86.394cm^2 area and 8.639cm^3 volume. There is Fat Layer (Subcutaneous Tissue) exposed. There is no tunneling or undermining noted. There is a medium amount of serosanguineous drainage noted. There is large (67-100%) granulation within the wound bed. There is no necrotic tissue within the wound bed. Wound #42 status is Open. Original cause of wound was Gradually Appeared. The date acquired was: 03/05/2021. The wound has been in treatment 2 weeks. The wound is located on the Left,Medial Lower Leg. The wound measures 1.5cm  length x 2cm width x 0.1cm depth; 2.356cm^2 area and 0.236cm^3 volume. There is Fat Layer (Subcutaneous Tissue) exposed. There is no tunneling or undermining noted. There is a medium amount of serosanguineous drainage noted. There is large (67-100%) granulation within the wound bed. There is no necrotic tissue within the wound bed. JERYL, UMHOLTZ ([id number]) Assessment Active Problems ICD-10 Lymphedema, not elsewhere classified Venous insufficiency (chronic) (peripheral) Non-pressure chronic ulcer of other part of right lower leg with fat layer exposed Non-pressure chronic ulcer of other part of left lower leg with fat layer exposed Procedures Wound #41 Pre-procedure diagnosis of Wound #41 is a Venous Leg Ulcer located on the Right,Circumferential Lower Leg . There was a Four Layer Compression Therapy Procedure by Carlene Coria, RN. Post procedure Diagnosis Wound #41: Same as Pre-Procedure Wound #42 Pre-procedure diagnosis of Wound #42 is a Venous Leg Ulcer located on the Right,Medial Lower Leg . There was a Four Layer Compression Therapy Procedure by Carlene Coria, RN. Post procedure Diagnosis Wound #42: Same as Pre-Procedure Plan Follow-up Appointments: Return Appointment in 1 week. Bathing/ Shower/ Hygiene: May shower with wound dressing protected with water repellent cover or cast protector. Edema Control - Lymphedema / Segmental Compressive Device / Other: Elevate, Exercise Daily and Avoid Standing for Long Periods of Time. Elevate legs to the level of the heart and pump ankles as often as possible Elevate leg(s) parallel to the floor when sitting. Off-Loading: Turn and reposition every 2 hours WOUND #41: - Lower Leg Wound Laterality: Left, Circumferential Primary Dressing: Silvercel 4 1/4x 4 1/4 (in/in) 1 x Per Week/30 Days Discharge Instructions: Apply Silvercel 4 1/4x 4 1/4 (in/in) as instructed Secondary Dressing: ABD Pad 5x9 (in/in) 1 x Per Week/30 Days Discharge  Instructions: Cover with ABD pad Compression Wrap: Medichoice 4 layer Compression System, 35-40 mmHG 1 x Per Week/30 Days Discharge Instructions: Apply multi-layer wrap as directed. WOUND #42: - Lower Leg Wound Laterality: Left, Medial Primary Dressing: Silvercel 4 1/4x 4 1/4 (in/in) 1 x Per Week/30 Days Discharge Instructions: Apply Silvercel 4 1/4x 4 1/4 (in/in) as instructed Secondary Dressing: ABD Pad 5x9 (in/in) 1 x Per Week/30 Days Discharge Instructions: Cover with ABD pad Compression Wrap: Medichoice 4 layer Compression System, 35-40 mmHG 1 x Per Week/30 Days Discharge Instructions: Apply multi-layer wrap as directed. 1. Would recommend that we going to continue with  the wound care measures as before and the patient is in agreement the plan. This includes the use of the silver alginate dressing followed by an ABD pad and 4-layer compression wrap to left leg. 2. I am also can recommend that we continue with the 4-layer compression wrap to the right leg as well. 3. I am also can I suggested the patient continue to monitor for any signs of worsening or infection. If anything changes he should let me know soon as possible. We will see patient back for reevaluation in 1 week here in the clinic. If anything worsens or changes patient will contact our office for additional recommendations. Electronic Signature(s) KNUT, RONDINELLI ([id number]) Signed: 04/08/2021 2:04:10 PM By: Worthy Keeler PA-C Entered By: Worthy Keeler on 04/08/2021 14:04:10 Adam Merritt ([id number]) -------------------------------------------------------------------------------- SuperBill Details Patient Name: Adam Merritt Date of Service: 04/08/2021 Medical Record Number: [id number] Patient Account Number: [id number] Date of Birth/Sex: 03/07/1946 (75 y.o. M) Treating RN: Carlene Coria Primary Care Provider: [name] Other Clinician: Referring Provider: [name] Treating Provider/Extender: Skipper Cliche in Treatment: 2 Diagnosis Coding ICD-10 Codes Code Description I89.0 Lymphedema, not elsewhere classified I87.2 Venous insufficiency (chronic) (peripheral) L97.812 Non-pressure chronic ulcer of other part of right lower leg with fat layer exposed L97.822 Non-pressure chronic ulcer of other part of left lower leg with fat layer exposed Facility Procedures CPT4: Description Modifier Quantity Code [id number] BILATERAL: Application of multi-layer venous compression system; leg (below knee), including 1 ankle and foot. Physician Procedures CPT4 Code: [id number] Description: 29937 - WC PHYS LEVEL 3 - EST PT Modifier: Quantity: 1 CPT4 Code: Description: ICD-10 Diagnosis Description I89.0 Lymphedema, not elsewhere classified I87.2 Venous insufficiency (chronic) (peripheral) L97.812 Non-pressure chronic ulcer of other part of right lower leg with fat la L97.822 Non-pressure chronic ulcer of  other part of left lower leg with fat lay Modifier: yer exposed er exposed Quantity: Electronic Signature(s) Signed: 04/08/2021 2:22:19 PM By: Carlene Coria RN Signed: 04/08/2021 4:56:27 PM By: Worthy Keeler PA-C Previous Signature: 04/08/2021 2:04:44 PM Version By: Worthy Keeler PA-C Entered By: Carlene Coria on 04/08/2021 14:22:18

## 2021-04-15 ENCOUNTER — Encounter: Payer: Medicare PPO | Admitting: Physician Assistant

## 2021-04-15 ENCOUNTER — Other Ambulatory Visit: Payer: Self-pay

## 2021-04-15 DIAGNOSIS — E119 Type 2 diabetes mellitus without complications: Secondary | ICD-10-CM | POA: Diagnosis not present

## 2021-04-15 DIAGNOSIS — I890 Lymphedema, not elsewhere classified: Secondary | ICD-10-CM | POA: Diagnosis not present

## 2021-04-15 DIAGNOSIS — Z794 Long term (current) use of insulin: Secondary | ICD-10-CM | POA: Diagnosis not present

## 2021-04-15 DIAGNOSIS — L97822 Non-pressure chronic ulcer of other part of left lower leg with fat layer exposed: Secondary | ICD-10-CM | POA: Diagnosis not present

## 2021-04-15 DIAGNOSIS — I872 Venous insufficiency (chronic) (peripheral): Secondary | ICD-10-CM | POA: Diagnosis not present

## 2021-04-15 DIAGNOSIS — L97812 Non-pressure chronic ulcer of other part of right lower leg with fat layer exposed: Secondary | ICD-10-CM | POA: Diagnosis not present

## 2021-04-15 NOTE — Progress Notes (Addendum)
Adam, Merritt ([id number]) Visit Report for 04/15/2021 Arrival Information Details Patient Name: Adam Merritt, Adam Merritt Date of Service: 04/15/2021 1:15 PM Medical Record Number: [id number] Patient Account Number: [id number] Date of Birth/Sex: May 19, 1946 (75 y.o. M) Treating RN: Carlene Coria Primary Care Raji Glinski: [name] Other Clinician: Referring Shakala Marlatt: [name] Treating Alfonsa Vaile/Extender: Skipper Cliche in Treatment: 3 Visit Information History Since Last Visit All ordered tests and consults were completed: No Patient Arrived: Wheel Chair Added or deleted any medications: No Arrival Time: 13:32 Any new allergies or adverse reactions: No Accompanied By: wife Had a fall or experienced change in No Transfer Assistance: None activities of daily living that may affect Patient Identification Verified: Yes risk of falls: Secondary Verification Process Completed: Yes Signs or symptoms of abuse/neglect since last visito No Patient Requires Transmission-Based Precautions: No Hospitalized since last visit: No Patient Has Alerts: No Implantable device outside of the clinic excluding No cellular tissue based products placed in the center since last visit: Has Dressing in Place as Prescribed: Yes Has Compression in Place as Prescribed: Yes Pain Present Now: No Electronic Signature(s) Signed: 04/15/2021 3:56:47 PM By: Carlene Coria RN Entered By: Carlene Coria on 04/15/2021 13:41:24 Adam Merritt ([id number]) -------------------------------------------------------------------------------- Clinic Level of Care Assessment Details Patient Name: Adam Merritt Date of Service: 04/15/2021 1:15 PM Medical Record Number: [id number] Patient Account Number: [id number] Date of Birth/Sex: [birth year] (75 y.o. M) Treating RN: Carlene Coria Primary Care Monai Hindes: [name] Other Clinician: Referring Treena Cosman: [name] Treating Deatra Mcmahen/Extender: Skipper Cliche in Treatment: 3 Clinic  Level of Care Assessment Items TOOL 1 Quantity Score []  - Use when EandM and Procedure is performed on INITIAL visit 0 ASSESSMENTS - Nursing Assessment / Reassessment []  - General Physical Exam (combine w/ comprehensive assessment (listed just below) when performed on new 0 pt. evals) []  - 0 Comprehensive Assessment (HX, ROS, Risk Assessments, Wounds Hx, etc.) ASSESSMENTS - Wound and Skin Assessment / Reassessment []  - Dermatologic / Skin Assessment (not related to wound area) 0 ASSESSMENTS - Ostomy and/or Continence Assessment and Care []  - Incontinence Assessment and Management 0 []  - 0 Ostomy Care Assessment and Management (repouching, etc.) PROCESS - Coordination of Care []  - Simple Patient / Family Education for ongoing care 0 []  - 0 Complex (extensive) Patient / Family Education for ongoing care []  - 0 Staff obtains Programmer, systems, Records, Test Results / Process Orders []  - 0 Staff telephones HHA, Nursing Homes / Clarify orders / etc []  - 0 Routine Transfer to another Facility (non-emergent condition) []  - 0 Routine Hospital Admission (non-emergent condition) []  - 0 New Admissions / Biomedical engineer / Ordering NPWT, Apligraf, etc. []  - 0 Emergency Hospital Admission (emergent condition) PROCESS - Special Needs []  - Pediatric / Minor Patient Management 0 []  - 0 Isolation Patient Management []  - 0 Hearing / Language / Visual special needs []  - 0 Assessment of Community assistance (transportation, D/C planning, etc.) []  - 0 Additional assistance / Altered mentation []  - 0 Support Surface(s) Assessment (bed, cushion, seat, etc.) INTERVENTIONS - Miscellaneous []  - External ear exam 0 []  - 0 Patient Transfer (multiple staff / Civil Service fast streamer / Similar devices) []  - 0 Simple Staple / Suture removal (25 or less) []  - 0 Complex Staple / Suture removal (26 or more) []  - 0 Hypo/Hyperglycemic Management (do not check if billed separately) []  - 0 Ankle / Brachial Index  (ABI) - do not check if billed separately Has the patient been seen at the hospital within the  last three years: Yes Total Score: 0 Level Of Care: ____ Adam Merritt ([id number]) Electronic Signature(s) Signed: 04/15/2021 3:56:47 PM By: Carlene Coria RN Entered By: Carlene Coria on 04/15/2021 14:22:06 Adam Merritt ([id number]) -------------------------------------------------------------------------------- Compression Therapy Details Patient Name: Adam Merritt Date of Service: 04/15/2021 1:15 PM Medical Record Number: [id number] Patient Account Number: [id number] Date of Birth/Sex: 1946-01-06 (75 y.o. M) Treating RN: Carlene Coria Primary Care Nyshawn Gowdy: [name] Other Clinician: Referring Mostyn Varnell: [name] Treating Juluis Fitzsimmons/Extender: Skipper Cliche in Treatment: 3 Compression Therapy Performed for Wound Assessment: Wound #41 Left,Circumferential Lower Leg Performed By: Clinician Carlene Coria, RN Compression Type: Four Layer Post Procedure Diagnosis Same as Pre-procedure Electronic Signature(s) Signed: 04/15/2021 3:56:47 PM By: Carlene Coria RN Entered By: Carlene Coria on 04/15/2021 14:18:55 Adam Merritt ([id number]) -------------------------------------------------------------------------------- Compression Therapy Details Patient Name: Adam Merritt Date of Service: 04/15/2021 1:15 PM Medical Record Number: [id number] Patient Account Number: [id number] Date of Birth/Sex: Feb 02, 1946 (75 y.o. M) Treating RN: Carlene Coria Primary Care Jessiah Wojnar: [name] Other Clinician: Referring Gisel Vipond: [name] Treating Harvie Morua/Extender: Skipper Cliche in Treatment: 3 Compression Therapy Performed for Wound Assessment: NonWound Condition Lymphedema - Right Leg Performed By: Clinician Carlene Coria, RN Compression Type: Four Layer Post Procedure Diagnosis Same as Pre-procedure Electronic Signature(s) Signed: 04/15/2021 3:56:47 PM By: Carlene Coria RN Entered By:  Carlene Coria on 04/15/2021 14:19:14 Adam Merritt ([id number]) -------------------------------------------------------------------------------- Encounter Discharge Information Details Patient Name: Adam Merritt Date of Service: 04/15/2021 1:15 PM Medical Record Number: [id number] Patient Account Number: [id number] Date of Birth/Sex: September 21, 1945 (75 y.o. M) Treating RN: Carlene Coria Primary Care Priti Consoli: [name] Other Clinician: Referring Jayleon Mcfarlane: [name] Treating Suann Klier/Extender: Skipper Cliche in Treatment: 3 Encounter Discharge Information Items Discharge Condition: Stable Ambulatory Status: Wheelchair Discharge Destination: Home Transportation: Private Auto Accompanied By: self Schedule Follow-up Appointment: Yes Clinical Summary of Care: Patient Declined Electronic Signature(s) Signed: 04/15/2021 2:23:30 PM By: Carlene Coria RN Entered By: Carlene Coria on 04/15/2021 14:23:30 Adam Merritt ([id number]) -------------------------------------------------------------------------------- Lower Extremity Assessment Details Patient Name: Adam Merritt Date of Service: 04/15/2021 1:15 PM Medical Record Number: [id number] Patient Account Number: [id number] Date of Birth/Sex: 1946-04-15 (75 y.o. M) Treating RN: Carlene Coria Primary Care Lucianne Smestad: [name] Other Clinician: Referring Mikal Blasdell: [name] Treating Kahmya Pinkham/Extender: Jeri Cos [name]: 3 Edema Assessment Assessed: [Left: No] [Right: No] [Left: Edema] [Right: :] Calf Left: Right: Point of Measurement: From Medial Instep 62 cm Ankle Left: Right: Point of Measurement: From Medial Instep 37 cm Knee To Floor Left: Right: From Medial Instep 40 cm Electronic Signature(s) Signed: 04/15/2021 3:56:47 PM By: Carlene Coria RN Entered By: Carlene Coria on 04/15/2021 13:54:20 Adam Merritt ([id number]) -------------------------------------------------------------------------------- Multi  Wound Chart Details Patient Name: Adam Merritt Date of Service: 04/15/2021 1:15 PM Medical Record Number: [id number] Patient Account Number: [id number] Date of Birth/Sex: 1945-07-26 (75 y.o. M) Treating RN: Carlene Coria Primary Care Neyah Ellerman: [name] Other Clinician: Referring Kynadee Dam: [name] Treating Deiontae Rabel/Extender: Skipper Cliche in Treatment: 3 Vital Signs Height(in): 47 Pulse(bpm): 20 Weight(lbs): 300 Blood Pressure(mmHg): 156/78 Body Mass Index(BMI): 48 Temperature(F): 98.3 Respiratory Rate(breaths/min): 18 Photos: [N/A:N/A] Wound Location: Left, Circumferential Lower Leg Left, Medial Lower Leg N/A Wounding Event: Gradually Appeared Gradually Appeared N/A Primary Etiology: Venous Leg Ulcer Venous Leg Ulcer N/A Comorbid History: Anemia, Lymphedema, Chronic Anemia, Lymphedema, Chronic N/A Obstructive Pulmonary Disease Obstructive Pulmonary Disease (COPD), Sleep Apnea, Congestive (COPD), Sleep Apnea, Congestive Heart Failure, Coronary Artery Heart Failure, Coronary Artery Disease, Hypertension, Peripheral  Disease, Hypertension, Peripheral Venous Disease, Type II Diabetes, Venous Disease, Type II Diabetes, Osteoarthritis, Neuropathy, Osteoarthritis, Neuropathy, Received Chemotherapy Received Chemotherapy Date Acquired: 03/05/2021 03/05/2021 N/A Weeks of Treatment: 3 3 N/A Wound Status: Open Healed - Epithelialized N/A Measurements L x W x D (cm) 3x2x0.1 0x0x0 N/A Area (cm) : 4.712 0 N/A Volume (cm) : 0.471 0 N/A % Reduction in Area: 99.10% 100.00% N/A % Reduction in Volume: 99.10% 100.00% N/A Classification: Full Thickness Without Exposed Full Thickness Without Exposed N/A Support Structures Support Structures Exudate Amount: Medium Medium N/A Exudate Type: Serosanguineous Serosanguineous N/A Exudate Color: red, brown red, brown N/A Granulation Amount: Large (67-100%) Large (67-100%) N/A Necrotic Amount: None Present (0%) None Present (0%) N/A Exposed  Structures: Fat Layer (Subcutaneous Tissue): Fat Layer (Subcutaneous Tissue): N/A Yes Yes Fascia: No Fascia: No Tendon: No Tendon: No Muscle: No Muscle: No Joint: No Joint: No Bone: No Bone: No Epithelialization: Large (67-100%) None N/A Treatment Notes Electronic Signature(s) Signed: 04/15/2021 3:56:47 PM By: Carlene Coria RN Entered By: Carlene Coria on 04/15/2021 13:58:29 Adam Merritt ([id number]) ZYHEIR, DAFT ([id number]) -------------------------------------------------------------------------------- [place] Details Patient Name: Adam Merritt Date of Service: 04/15/2021 1:15 PM Medical Record Number: [id number] Patient Account Number: [id number] Date of Birth/Sex: 04/11/1946 (75 y.o. M) Treating RN: Carlene Coria Primary Care Aaric Dolph: [name] Other Clinician: Referring Paislea Hatton: [name] Treating Megha Agnes/Extender: Skipper Cliche in Treatment: 3 Active Inactive Abuse / Safety / Falls / Self Care Management Nursing Diagnoses: Potential for injury related to falls Goals: Patient will remain injury free related to falls Date Initiated: 03/25/2021 Target Resolution Date: 04/25/2021 Goal Status: Active Interventions: Assess Activities of Daily Living upon admission and as needed Assess fall risk on admission and as needed Assess: immobility, friction, shearing, incontinence upon admission and as needed Assess impairment of mobility on admission and as needed per policy Assess personal safety and home safety (as indicated) on admission and as needed Assess self care needs on admission and as needed Notes: Nutrition Nursing Diagnoses: Potential for alteratiion in Nutrition/Potential for imbalanced nutrition Goals: Patient/caregiver verbalizes understanding of need to maintain therapeutic glucose control per primary care physician Date Initiated: 03/25/2021 Target Resolution Date: 04/25/2021 Goal Status:  Active Interventions: Assess patient nutrition upon admission and as needed per policy Notes: Wound/Skin Impairment Nursing Diagnoses: Knowledge deficit related to ulceration/compromised skin integrity Goals: Patient/caregiver will verbalize understanding of skin care regimen Date Initiated: 03/25/2021 Target Resolution Date: 04/25/2021 Goal Status: Active Ulcer/skin breakdown will have a volume reduction of 30% by week 4 Date Initiated: 03/25/2021 Target Resolution Date: 05/25/2021 Goal Status: Active Ulcer/skin breakdown will have a volume reduction of 50% by week 8 Date Initiated: 03/25/2021 Target Resolution Date: 06/05/2021 Goal Status: Active Ulcer/skin breakdown will have a volume reduction of 80% by week 12 Date Initiated: 03/25/2021 Target Resolution Date: 07/26/2021 Goal Status: Active ORLYN, ODONOGHUE ([id number]) Ulcer/skin breakdown will heal within 14 weeks Date Initiated: 03/25/2021 Target Resolution Date: 08/23/2021 Goal Status: Active Interventions: Assess patient/caregiver ability to obtain necessary supplies Assess patient/caregiver ability to perform ulcer/skin care regimen upon admission and as needed Assess ulceration(s) every visit Notes: Electronic Signature(s) Signed: 04/15/2021 3:56:47 PM By: Carlene Coria RN Entered By: Carlene Coria on 04/15/2021 13:58:09 Adam Merritt ([id number]) -------------------------------------------------------------------------------- Pain Assessment Details Patient Name: Adam Merritt Date of Service: 04/15/2021 1:15 PM Medical Record Number: [id number] Patient Account Number: [id number] Date of Birth/Sex: [birth year] (75 y.o. M) Treating RN: Carlene Coria Primary Care Dinorah Masullo: [name] Other Clinician: Referring Jahmeir Geisen: Humphrey Rolls,  Latricia Heft Treating Lakayla Barrington/Extender: Jeri Cos [name]: 3 Active Problems Location of Pain Severity and Description of Pain Patient Has Paino No Site Locations Pain Management  and Medication Current Pain Management: Electronic Signature(s) Signed: 04/15/2021 3:56:47 PM By: Carlene Coria RN Entered By: Carlene Coria on 04/15/2021 13:42:04 Adam Merritt ([id number]) -------------------------------------------------------------------------------- Patient/Caregiver Education Details Patient Name: Adam Merritt Date of Service: 04/15/2021 1:15 PM Medical Record Number: [id number] Patient Account Number: [id number] Date of Birth/Gender: [birth year] (75 y.o. M) Treating RN: Carlene Coria Primary Care Physician: [name] Other Clinician: Referring Physician: [name] Treating Physician/Extender: Skipper Cliche in Treatment: 3 Education Assessment Education Provided To: Patient Education Topics Provided Wound/Skin Impairment: Methods: Explain/Verbal Responses: State content correctly Electronic Signature(s) Signed: 04/15/2021 3:56:47 PM By: Carlene Coria RN Entered By: Carlene Coria on 04/15/2021 14:22:33 Adam Merritt ([id number]) -------------------------------------------------------------------------------- Wound Assessment Details Patient Name: Adam Merritt Date of Service: 04/15/2021 1:15 PM Medical Record Number: [id number] Patient Account Number: [id number] Date of Birth/Sex: 1945-10-28 (75 y.o. M) Treating RN: Carlene Coria Primary Care Brode Sculley: [name] Other Clinician: Referring Alexis Mizuno: [name] Treating Taneya Conkel/Extender: Jeri Cos [name]: 3 Wound Status Wound Number: 41 Primary Venous Leg Ulcer Etiology: Wound Location: Left, Circumferential Lower Leg Wound Open Wounding Event: Gradually Appeared Status: Date Acquired: 03/05/2021 Comorbid Anemia, Lymphedema, Chronic Obstructive Pulmonary Weeks Of Treatment: 3 History: Disease (COPD), Sleep Apnea, Congestive Heart Failure, Clustered Wound: No Coronary Artery Disease, Hypertension, Peripheral Venous Disease, Type II Diabetes, Osteoarthritis,  Neuropathy, Received Chemotherapy Photos Wound Measurements Length: (cm) 3 Width: (cm) 2 Depth: (cm) 0.1 Area: (cm) 4.712 Volume: (cm) 0.471 % Reduction in Area: 99.1% % Reduction in Volume: 99.1% Epithelialization: Large (67-100%) Tunneling: No Undermining: No Wound Description Classification: Full Thickness Without Exposed Support Structu Exudate Amount: Medium Exudate Type: Serosanguineous Exudate Color: red, brown res Wound Bed Granulation Amount: Large (67-100%) Exposed Structure Necrotic Amount: None Present (0%) Fascia Exposed: No Fat Layer (Subcutaneous Tissue) Exposed: Yes Tendon Exposed: No Muscle Exposed: No Joint Exposed: No Bone Exposed: No Treatment Notes Wound #41 (Lower Leg) Wound Laterality: Left, Circumferential Cleanser Peri-Wound Care Topical ALESANDRO, STUEVE ([id number]) Primary Dressing Silvercel 4 1/4x 4 1/4 (in/in) Discharge Instruction: Apply Silvercel 4 1/4x 4 1/4 (in/in) as instructed Secondary Dressing ABD Pad 5x9 (in/in) Discharge Instruction: Cover with ABD pad Secured With Compression Wrap Medichoice 4 layer Compression System, 35-40 mmHG Discharge Instruction: Apply multi-layer wrap as directed. Compression Stockings Add-Ons Electronic Signature(s) Signed: 04/15/2021 3:56:47 PM By: Carlene Coria RN Entered By: Carlene Coria on 04/15/2021 13:53:01 Adam Merritt ([id number]) -------------------------------------------------------------------------------- Wound Assessment Details Patient Name: Adam Merritt Date of Service: 04/15/2021 1:15 PM Medical Record Number: [id number] Patient Account Number: [id number] Date of Birth/Sex: Apr 11, 1946 (75 y.o. M) Treating RN: Carlene Coria Primary Care Wm Fruchter: [name] Other Clinician: Referring Caralynn Gelber: [name] Treating Buffie Herne/Extender: Jeri Cos [name]: 3 Wound Status Wound Number: 42 Primary Venous Leg Ulcer Etiology: Wound Location: Left, Medial Lower  Leg Wound Healed - Epithelialized Wounding Event: Gradually Appeared Status: Date Acquired: 03/05/2021 Comorbid Anemia, Lymphedema, Chronic Obstructive Pulmonary Weeks Of Treatment: 3 History: Disease (COPD), Sleep Apnea, Congestive Heart Failure, Clustered Wound: No Coronary Artery Disease, Hypertension, Peripheral Venous Disease, Type II Diabetes, Osteoarthritis, Neuropathy, Received Chemotherapy Photos Wound Measurements Length: (cm) 0 Width: (cm) 0 Depth: (cm) 0 Area: (cm) Volume: (cm) % Reduction in Area: 100% % Reduction in Volume: 100% Epithelialization: None 0 Tunneling: No 0 Undermining: No Wound Description Classification: Full Thickness Without Exposed Support Structu  Exudate Amount: Medium Exudate Type: Serosanguineous Exudate Color: red, brown res Wound Bed Granulation Amount: Large (67-100%) Exposed Structure Necrotic Amount: None Present (0%) Fascia Exposed: No Fat Layer (Subcutaneous Tissue) Exposed: Yes Tendon Exposed: No Muscle Exposed: No Joint Exposed: No Bone Exposed: No Treatment Notes Wound #42 (Lower Leg) Wound Laterality: Left, Medial Cleanser Peri-Wound Care Topical ZAIAH, ECKERSON ([id number]) Primary Dressing Secondary Dressing Secured With Compression Wrap Compression Stockings Add-Ons Electronic Signature(s) Signed: 04/15/2021 3:56:47 PM By: Carlene Coria RN Entered By: Carlene Coria on 04/15/2021 13:53:44 Adam Merritt ([id number]) -------------------------------------------------------------------------------- [place] Details Patient Name: Adam Merritt Date of Service: 04/15/2021 1:15 PM Medical Record Number: [id number] Patient Account Number: [id number] Date of Birth/Sex: 1945/12/12 (75 y.o. M) Treating RN: Carlene Coria Primary Care Alexxia Stankiewicz: [name] Other Clinician: Referring Emmanual Gauthreaux: [name] Treating Caliegh Middlekauff/Extender: Skipper Cliche in Treatment: 3 Vital Signs Time Taken: 13:41 Temperature (F):  98.3 Height (in): 66 Pulse (bpm): 78 Weight (lbs): 300 Respiratory Rate (breaths/min): 18 Body Mass Index (BMI): 48.4 Blood Pressure (mmHg): 156/78 Reference Range: 80 - 120 mg / dl Electronic Signature(s) Signed: 04/15/2021 3:56:47 PM By: Carlene Coria RN Entered By: Carlene Coria on 04/15/2021 13:41:52

## 2021-04-15 NOTE — Progress Notes (Addendum)
MELANIE, PELLOT ([id number]) Visit Report for 04/15/2021 Chief Complaint Document Details Patient Name: Adam Merritt, Adam Merritt Date of Service: 04/15/2021 1:15 PM Medical Record Number: [id number] Patient Account Number: [id number] Date of Birth/Sex: 14-Apr-1946 (75 y.o. M) Treating RN: Carlene Coria Primary Care Provider: [name] Other Clinician: Referring Provider: [name] Treating Provider/Extender: Skipper Cliche in Treatment: 3 Information Obtained from: Patient Chief Complaint Bilateral LE edema and weeping ulcers Electronic Signature(s) Signed: 04/15/2021 1:04:26 PM By: Worthy Keeler PA-C Entered By: Worthy Keeler on 04/15/2021 13:04:25 Adam Merritt ([id number]) -------------------------------------------------------------------------------- HPI Details Patient Name: Adam Merritt Date of Service: 04/15/2021 1:15 PM Medical Record Number: [id number] Patient Account Number: [id number] Date of Birth/Sex: [birth year] (75 y.o. M) Treating RN: Carlene Coria Primary Care Provider: [name] Other Clinician: Referring Provider: [name] Treating Provider/Extender: Skipper Cliche in Treatment: 3 History of Present Illness HPI Description: 10/18/17-He is here for initial evaluation of bilateral lower extremity ulcerations in the presence of venous insufficiency and lymphedema. He has been seen by vascular medicine in the past, Dr. Lucky Cowboy, last seen in 2016. He does have a history of abnormal ABIs, which is to be expected given his lymphedema and venous insufficiency. According to Epic, it appears that all attempts for arterial evaluation and/or angiography were not follow through with by patient. He does have a history of being seen in lymphedema clinic in 2018, stopped going approximately 6 months ago stating "it didn't do any good". He does not have lymphedema pumps, he does not have custom fit compression wrap/stockings. He is diabetic and his recent A1c last month was  7.6. He admits to chronic bilateral lower extremity pain, no change in pain since blister and ulceration development. He is currently being treated with Levaquin for bronchitis. He has home health and we will continue. 10/25/17-He is here in follow-up evaluation for bilateral lower extremity ulcerationssubtle he remains on Levaquin for bronchitis. Right lower extremity with no evidence of drainage or ulceration, persistent left lower extremity ulceration. He states that home health has not been out since his appointment. He went to [facility] on Tuesday, studies revealed: RIGHT ABI 0.9, TBI 0.6 LEFT ABI 1.1, TBI 0.6 with triphasic flow bilaterally. We will continue his same treatment plan. He has been educated on compression therapy and need for elevation. He will benefit from lymphedema pumps 11/01/17-He is here in follow-up evaluation for left lower extremity ulcer. The right lower extremity remains healed. He has home health services, but they have not been out to see the patient for 2-3 weeks. He states it home health physical therapy changed his dressing yesterday after therapy; he placed Ace wrap compression. We are still waiting for lymphedema pumps, reordered d/t need for company change. 11/08/17-He is here in follow-up evaluation for left lower extremity ulcer. It is improved. Edema is significantly improved with compression therapy. We will continue with same treatment plan and he will follow-up next week. No word regarding lymphedema pumps 11/15/17-He is here in follow-up evaluation for left lower extremity ulcer. He is healed and will be discharged from wound care services. I have reached out to [facility] regarding his lymphedema pumps. They have been unable to reach the patient; the contact number they had with the patient's wife's cell phone and she has not answered any unrecognized calls. Contact should be made today, trial planned for next week; [facility]  will continue to follow 11/27/17 on evaluation today patient has multiple blistered areas over the right  lower extremity his left lower extremity appears to be doing okay. These blistered areas show signs of no infection which is great news. With that being said he did have some necrotic skin overlying which was mechanically debrided away with saline and gauze today without complication. Overall post debridement the wounds appear to be doing better but in general his swelling seems to be increased. This is obviously not good news. I think this is what has given rise to the blisters. 12/04/17 on evaluation today patient presents for follow-up concerning his bilateral lower extremity edema in the right lower extremity ulcers. He has been tolerating the dressing changes without complication. With that being said he has had no real issues with the wraps which is also good news. Overall I'm pleased with the progress he's been making. 12/11/17 on evaluation today patient appears to be doing rather well in regard to his right lateral lower extremity ulcer. He's been tolerating the dressing changes without complication. Fortunately there does not appear to be any evidence of infection at this time. Overall I'm pleased with the progress that is being made. Unfortunately he has been in the hospital due to having what sounds to be a stomach virus/flu fortunately that is starting to get better. 12/18/17 on evaluation today patient actually appears to be doing very well in regard to his bilateral lower extremities the swelling is under fairly good control his lymphedema pumps are still not up and running quite yet. With that being said he does have several areas of opening noted as far as wounds are concerned mainly over the left lower extremity. With that being said I do believe once he gets lymphedema pumps this would least help you mention some the fluid and preventing this from occurring. Hopefully that will be set  up soon sleeves are Artie in place at his home he just waiting for the machine. 12/25/17 on evaluation today patient actually appears to be doing excellent in fact all of his ulcers appear to have resolved his legs appear very well. I do think he needs compression stockings we have discussed this and they are actually going to go to [place] today to elastic therapy to get this fitted for him. I think that is definitely a good thing to do. Readmission: 04/09/18 upon evaluation today this patient is seen for readmission due to bilateral lower extremity lymphedema. He has significant swelling of his extremities especially on the left although the right is also swollen he has weeping from both sides. There are no obvious open wounds at this point. Fortunately he has been doing fairly well for quite a bit of time since I last saw him. Nonetheless unfortunately this seems to have reopened and is giving quite a bit of trouble. He states this began about a week ago when he first called [place] to get in to be seen. No fevers, chills, nausea, or vomiting noted at this time. He has not been using his lymphedema pumps due to the fact that they won't fit on his leg at this point likewise is also not been using his compression for essentially the same reason. 04/16/18 upon evaluation today patient actually appears to be doing a little better in regard to the fluid in his bilateral lower extremities. With that being said he's had three falls since I saw him last week. He also states that he's been feeling very poorly. I was concerned last week and feel like that the concern is still there as far as the congestion  in his chest is concerned he seems to be breathing about the same as last week but again he states he's very weak he's not even able to walk further than from the chair to the door. His wife had to buy a wheelchair just to be able to get them out of the house to get to the appointment today. This has me very  concerned. 04/23/18 on evaluation today patient actually appears to be doing much better than last week's evaluation. At that time actually had to transport him to the ER via EMS and he subsequently was admitted for acute pulmonary edema, acute renal failure, and acute congestive heart failure. Fortunately he is doing much better. Apparently they did dialyze him and were able to take off roughly 35 pounds of fluid. Nonetheless he is feeling much better both in regard to his breathing and he's able to get around much better at this time compared to previous. Overall I'm very GENO, Adam Merritt ([id number]) happy with how things are at this time. There does not appear to be any evidence of infection currently. No fevers, chills, nausea, or vomiting noted at this time. 04/30/2018 patient seen today for follow-up and management of bilateral lower extremity lymphedema. He did express being more sad today than usual due to the recent loss of his dog. He states that he has been compliant with using the lymphedema pumps. However he does admit a minute over the last 2-3 days he has not been using the pumps due to the recent loss of his dog. At this time there is no drainage or open wounds to his lower extremities. The left leg edema is measuring smaller today. Still has a significant amount of edema on bilateral lower extremities With dry flaky skin. He will be referred to the lymphedema clinic for further management. Will continue 3 layer compression wraps and follow-up in 1-2 weeks.Denies any pain, fever, chairs recently. No recent falls or injuries reported during this visit. 05/07/18 on evaluation today patient actually appears to be doing very well in regard to his lower extremities in general all things considered. With that being said he is having some pain in the legs just due to the amount of swelling. He does have an area where he had a blister on the left lateral lower extremity this is open at this  point other than that there's nothing else weeping at this time. 05/14/18 on evaluation today patient actually appears to be doing excellent all things considered in regard to his lower extremities. He still has a couple areas of weeping on each leg which has continued to be the issue for him. He does have an appointment with the lymphedema clinic although this isn't until February 2020. That was the earliest they had. In the meantime he has continued to tolerate the compression wraps without complication. 05/28/18 on evaluation today patient actually appears to be doing more poorly in regard to his left lower extremity where he has a wound open at this point. He also had a fall where he subsequently injured his right great toe which has led to an open wound at the site unfortunately. He has been tolerating the dressing changes without complication in general as far as the wraps are concerned that he has not been putting any dressing on the left 1st toe ulcer site. 06/11/18 on evaluation today patient appears to be doing much worse in regard to his bilateral lower extremity ulcers. He has been tolerating the dressing changes without complication  although his legs have not been wrapped more recently. Overall I am not very pleased with the way his legs appear. I do believe he needs to be back in compression wraps he still has not received his compression wraps from the [facility] hospital as of yet. 06/18/18 on evaluation today patient actually appears to be doing significantly better than last time I saw him. He has been tolerating the compression wraps without complication in the circumferential ulcers especially appear to be doing much better. His toe ulcer on the right in regard to the great toe is better although not as good as the legs in my pinion. No fevers chills noted 07/02/18 on evaluation today patient appears to be doing much better in regard to his lower extremity ulcers. Unfortunately since I last  saw him he's had the distal portion of his right great toe if he dated it sounds as if this actually went downhill very quickly. I had only seen him a few days prior and the toe did not appear to be infected at that point subsequently became infected very rapidly and it was decided by the surgeon that the distal portion of the toe needed to be removed. The patient seems to be doing well in this regard he tells me. With that being said his lower extremities are doing better from the standpoint of the wounds although he is significantly swollen at this point. 07/09/18 on evaluation today patient appears to be doing better in regard to the wounds on his lower extremities. In fact everything is almost completely healed he is just a small area on the left posterior lower extremity that is open at this point. 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 With that being said he needs some kind of compression it does sound like he may not be wearing his compression, that is the wraps, during the entire time between when he's here visit to visit. Apparently his wife took the current one off because it began to "fall apart". 07/16/18 on evaluation today patient appears to be extremely swollen especially in regard to his left lower extremity unfortunately. He also has a new skin tear over the left lower extremity and there's a smaller area on the right lower extremity as well. Unfortunately this seems to be due in part to blistering and fluid buildup in his leg. He did get the reduction wraps that were ordered by the [facility] hospital for him to go to lymphedema clinic. With that being said his wounds on the legs have not healed to the point to where they would likely accept them as a patient lymphedema clinic currently. We need to try to  get this to heal. With that being said he's been taking his wraps off which is not doing him any favors at this point. In fact this is probably quite counterproductive compared to what needs to occur. We will likely need to increase to a four layer compression wrap and continue to also utilize elevation and he has to keep the wraps on not take them off as he's been doing currently hasn't had a wrap on since Saturday. 07/23/18 on evaluation today patient appears to be doing much better in regard to his bilateral lower extremities. In fact his left lower extremity which was the largest is actually 15 cm smaller today compared  to what it was last time he was here in our clinic. This is obviously good news after just one week. Nonetheless the differences he actually kept the wraps on during the entire week this time. That's not typical for him. I do believe he understands a little bit better now the severity of the situation and why it's important for him to keep these wraps on. 07/30/18 on evaluation today patient actually appears to be doing rather well in regard to his lower extremities. His legs are much smaller than they have been in the past and he actually has only one very small rudder superficial region remaining that is not closed on the left lateral/posterior lower extremity even this is almost completely close. I do believe likely next week he will be healed without any complications. I do think we need to continue the wraps however this seems to be beneficial for him. I also think it may be a good time for [place] to go ahead and see about getting the appointment with the lymphedema clinic which is supposed to be made for him in order to keep this moving along and hopefully get them into compression wraps that will in the end help him to remain healed. 08/06/18 on evaluation today patient actually appears to be doing very well in regard as bilateral lower extremities. In fact his wounds appear to  be completely healed at this time. 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 He is in the process of getting appointment with the lymphedema clinic we have made this referral were just waiting to hear back on the schedule time. We need to follow up on that today as well. 08/13/18 on evaluation today patient actually appears to be doing very well in regard to his bilateral lower extremities there are no open wounds at this point. We are gonna go ahead and see about ordering the Velcro compression wraps for him had a discussion with them about [place] doing it versus the [place] they feel like they can definitely afford going ahead and get the wraps themselves and they would prefer to try to avoid having to go to the lymphedema clinic if it all possible which I completely understand. As long as he has good compression I'm okay either way. 3/17//20 on evaluation today patient actually appears to be doing well in regard to his bilateral lower extremity ulcers. He has been tolerating the dressing changes without complication specifically the compression wraps. Overall is had no issues my fingers finding that I see at this point is that he is having trouble with constipation. He tells me he has not been able to go to the bathroom for about six days. He's taken to over-the- counter oral laxatives unfortunately this is not helping. He has not contacted his doctor. DEANTE, BLOUGH ([id number]) 08/27/18 on evaluation today patient appears to be doing fairly well in regard to his lower extremities at this point. There does not appear to be any new altars is swelling is very well controlled. We are still waiting for his Velcro compression wraps to arrive that should be sometime in the next week is Artie sent the check for these. 09/03/18 on evaluation today patient appears to be doing excellent in regard to his bilateral lower extremities which he shows no signs of  wound openings in regard to this point. He does have his Velcro compression wraps which did arrive in the mail since I saw him last week.  Overall he is doing excellent in my pinion. 09/13/18 on evaluation today patient appears to be doing very well currently in regard to the overall appearance of his bilateral lower extremities although he's a little bit more swollen than last time we saw him. At that point he been discharged without any open wounds. Nonetheless he has a small open wound on the posterior left lower extremity with some evidence of cellulitis noted as well. Fortunately I feel like he has made good progress overall with regard to his lower extremities from were things used to be. 09/20/18 on evaluation today patient actually appears to be doing much better. The erythematous lower extremity is improving wound itself which is still open appears to be doing much better as far as but appearance as well as pain is concerned overall very pleased in this regard. There's no signs of active infection at this time. 09/27/18 on evaluation today patient's wounds on the lower extremity actually appear to be doing fairly well at this time which is good news. There is no evidence of active infection currently and again is just as left lower extremity were there any wounds at this point anyway. I believe they may be completely healed but again I'm not 100% sure based on evaluation today. I think one more week of observation would likely be a good idea. 10/04/18 on evaluation today patient actually appears to be doing excellent in regard to the left lower extremity which actually appears to be completely healed as of today. Unfortunately he's been having issues with his right lower extremity have a new wound that has opened. Fortunately there's no evidence of active infection at this time which is good news. 10/10/18 upon evaluation today patient actually appears to be doing a little bit worse with the new  open area on his right posterior lower extremity. He's been tolerating the dressing changes without complication. Right now we been using his compression wraps although I think we may need to switch back to actually performing bilateral compression wraps are in the clinic. No fevers, chills, nausea, or vomiting noted at this time. 10/17/18 on evaluation today patient actually appears to be doing quite well in regard to his bilateral lower extremity ulcers. He has been tolerating the reps without complication although he would prefer not to rewrap his legs as of today. Fortunately there's no signs of active infection which is good news. No fevers, chills, nausea, or vomiting noted at this time. 10/24/18 on evaluation today patient appears to be doing very well in regard to his lower extremities. His right lower extremity is shown signs of healing and his left lower [place] though not healed appears to be improving which is excellent news. Overall very pleased with how things seem to be progressing at this time. The patient is likewise happy to hear this. His Velcro compression wraps however have not been put on properly will gonna show his wife how to do that properly today. 10/31/18 on evaluation today patient appears to be doing more poorly in regard to his left lower extremity in particular although both lower extremities actually are showing some signs of being worse than my previous evaluation. Unfortunately I'm just not sure that his compression stockings even with the use of the compression/lymphedema pumps seem to be controlling this well. Upon further questioning he tells me that he also is not able to lie flat in the bed due to his congestive heart failure and difficulty breathing. For that reason he sleeps in his  recliner. To make matters worse his recliner also cannot even hold his legs up so instead of even being somewhat elevated they pretty much hang down to the floor. This is the way he  sleeps each night which is definitely counterproductive to everything else that were attempting to do from the standpoint of controlling his fluid. Nonetheless I think that he potentially could benefit from a hospital bed although this would be something that his primary care provider would likely have to order since anything that is order on our side has to be directly related to wound care and again the hospital bed is not necessarily a direct relation to although I think it does contribute to his overall wound status on his lower extremities. 11/07/18 on evaluation today patient appears to be doing better in regard to his bilateral lower extremity. He's been tolerating the dressing changes without complication. We did in the interim since I last saw him switch to just using extras orbiting new alginate and that seems to have done much better for him. I'm very pleased with the overall progress that is made. 11/14/18 on evaluation today patient appears to be redoing rather well in regard to his left lower extremity ulcers which are the only ones remaining at this point. Fortunately there's no signs of active infection at this time which is good news. Overall been very pleased with how things seem to be progressing currently. No fevers, chills, nausea, or vomiting noted at this time. 11/20/17 on evaluation today patient actually appears to be doing quite well in regard to his lower extremities on the left on the right he has several blisters that showed up although there's some question about whether or not he has had his broker compression wraps on like he was supposed to or not. Fortunately there's no signs of active infection at this time which is good news. Unfortunately though he is doing well from the standpoint of the left leg the right leg is not doing as well again this is when we did not wrap last week. 11/28/18 on evaluation today patient appears to be doing well in regard to his bilateral lower  extremities is left appears to be healed is right is not healed but is very close to being so. Overall very pleased with how things seem to be progressing. Patient is likewise happy that things are doing well. 12/09/18 on evaluation today patient actually appears to be completely healed which is excellent news. He actually seems to be doing well in regard to the swelling of the bilateral lower extremities which is also great news. Overall very pleased with how things seem to be progressing. Readmission: 03/18/2019 upon evaluation today patient presents for reevaluation concerning issues that he is having with his left lower extremity where he does have a wound noted upon inspection today. He also has a wound on the right second toe on the tip where it is apparently been rubbing in his shoe I did look at issues and you can actually see where this has been occurring as well. Fortunately there is no signs of infection with regard to the toe necessarily although it is very swollen compared to normal that does have me somewhat concerned about the possibility of further evaluation for infection/osteomyelitis. I would recommend an x-ray to start with. Otherwise he states that it is been 1-2 weeks that he has had the draining in regard to left lower extremity he does not know how this happened he has been wearing his  compression appropriately and I do see that as well today but nonetheless I do think that he needs to continue to be very cautious with regard to elevation as well. ERYX, ZANE ([id number]) 03/25/2019 on evaluation today patient appears to be doing much better in regard to his lower extremity at this time. That is on the left. He did culture positive for Pseudomonas but the good news is he seems to be doing much better the gentamicin we applied topically seems to have done a great job for him. There is no signs of active infection at this time systemically and locally he is doing much better.  No fevers, chills, nausea, vomiting, or diarrhea. In regard to his right toe this seems to be doing much better there is very little pressure noted at this point I did clean up some of the callus today around the edges of the wound as well as the surface of the wound but to be honest he is progressing quite nicely. 10/30; the area on the left anterior lower tibia area is healed over. His edema control is adequate even though his use of the compression pumps seems very intermittent. He has a juxta lite stocking on the right leg and a think there is 1 for the left leg at home. His wife is putting these on. He has an area on the plantar right second toe which is a hammertoe they are trying to offload this 04/08/2019 on evaluation today patient appears to be doing well in regard to his lower extremities which is good news. We are seeing him today for his toe ulcer which is giving him a little bit more trouble but again still seems to be doing better at this point which is good news. He is going require some sharp debridement in regard to the toe today however. 04/15/2019 on evaluation today patient actually appears to be doing quite well with regard to his toe ulcer. I am very pleased with how things are doing in that regard. With regard to his lower extremity edema in general he tells me he is not been using the lymphedema pumps regularly although he does not use them. I think that he needs to use them more regularly he does have a small blister on the left lower extremity this is not open at this point but obviously it something that can get worse if he does not keep the compression going and the pumps going as well. 04/22/2019 on evaluation today patient appears to be doing well with regard to his toe ulcer. He has been tolerating the dressing changes without complication. This is measuring slightly smaller this week as compared to last week. Fortunately there is no signs of active infection at this  time. 05/06/2019 on evaluation today patient actually appears to be doing excellent. In fact his toe appears to be completely healed which is great news. There is no signs of active infection at this time. Readmission: Patient presents today for follow-up after having been discharged at the beginning of December 2020. He states that in recent weeks things have reopened and has been having more trouble at this point. With that being said fortunately there is no signs of active infection at this time. No fever chills noted. Nonetheless he also does have an issue with one of his toes as well that seems to be somewhat this is on the right foot second toe. 07/25/2019 upon evaluation today patient's lower extremities appear to be doing excellent bilaterally. I really  feel like he is showing signs of great improvement and in fact I think he is close to healing which is great news. There is no signs of active infection at this time. No fevers, chills, nausea, vomiting, or diarrhea. 07/31/2019 upon evaluation today patient appears to be doing excellent and in fact I think may be completely healed in regard to his right lower extremity that were still to monitor this 1 more week before closing it out. The left lower extremity is measuring smaller although not completely closed seems to be doing excellent. 08/07/2019 upon evaluation today patient appears to be doing excellent in regard to his wounds. In fact the right lower extremity is completely healed there is no issues here. The left lower extremity has 1 very small area still remaining fortunately there is no signs of infection and overall I think he is very close to closure of the here as well. 08/14/19 upon evaluation today patient appears to be doing excellent in regard to his lower extremities. In fact he appears to be completely healed as of today. Fortunately there's no signs of active infection at this time. No fevers, chills, nausea, or vomiting noted  at this time. READMISSION 09/26/2019 This is a 75 year old man who is well-known to this clinic having just been discharged on 08/14/2019. He has known lymphedema and chronic venous insufficiency. He has Farrow wrap stockings and also external compression pumps. He does not use the external compression pumps however. He does however apparently fairly faithfully use the stockings. They have not been lotion in his legs. He has developed new wounds on the right anterior as well as the left medial left lateral and left posterior calf. He returns for our review of this Past medical history includes coronary artery disease, hypertension, congestive heart failure, COPD, venous insufficiency with lymphedema, type 2 diabetes. He apparently has compression pumps, Farrow wraps and at 1 point a hospital bed although I believe he sleeps in a recliner 10/03/2019 upon evaluation today patient appears to be doing better with regard to his wounds in general. He has been tolerating the dressing changes without complication. Fortunately there is no signs of active infection. No fevers, chills, nausea, vomiting, or diarrhea. I do believe the compression wraps are helping as they always have in the past. 10/10/2019 upon evaluation today patient appears to be doing excellent at this point. His right lower extremity is measuring much better and in fact is completely healed. His left lower extremity is also measuring better though not healed seems to be doing excellent at this point which is great news. Overall I am very pleased with how things appear currently. 10/27/2019 upon evaluation today patient appears to be doing quite well with regard to his wounds currently. He has been tolerating the dressing changes without complication. Fortunately there is no signs of active infection at this time. In fact he is almost completely healed on the left and has just a very small area open and on the right he is completely  healed. 11/04/2019 upon evaluation today patient appears to be doing quite well with regard to his wounds. In fact he appears to be quite possibly completely healed on the left lower extremity now as well the right lower extremity is still doing well. With that being said unfortunately though this may be healed I think it is a very fragile area and I cannot even confirm there is not a very small opening still remaining. I really think he may benefit from 1 additional  weeks wrapping before discontinuing. 11/10/2019 upon evaluation today patient appears to be doing well in regard to his original wounds in fact everything that we were treating last week is completely healed on the left. Unfortunately he has a new skin tear just above where the wrap slipped down due to a fall he sustained causing this injury. 11/17/2019 on evaluation today patient appears to be doing better with regard to his wound. He has been tolerating the dressing changes without ERICSON, NAFZIGER. ([id number]) complication. Fortunately there is no signs of active infection at this time. No fevers, chills, nausea, vomiting, or diarrhea. 11/24/2019 upon evaluation today patient appears to be doing well with regard to his wound. This is making good progress there does not appear to be signs of active infection but overall I do feel like he is headed in the correct direction. Overall he is tolerating the compression wrap as well. 12/02/2019 upon evaluation today patient appears to be doing well with regard to his leg ulcer. In fact this appears to be completely healed and this is the last of the wounds that he had but still the left anterior lower extremity. Fortunately there is no signs of active infection at this time. No fevers, chills, nausea, vomiting, or diarrhea. Readmission: 02/05/2020 on evaluation today patient appears to be doing well with regard to his wound all things considered. He actually tells me that a couple weeks ago he had  trouble with his wraps and therefore took him off for a couple of days in order to give his legs a break. It was during this time that he actually developed increased swelling and edema of his legs and blisters on both legs. That is when he called to make the appointment. Nonetheless since that time he began wearing his compression wraps which are Velcro in the meantime and has done extremely well with this. In fact his leg appears to be under good control as far as edema is concerned today it is nothing like what I am seeing when he has come in for evaluations in the past. They have been using some of the silver alginate at home which has been beneficial for him. Fortunately there is no signs of active infection at this time. No fevers, chills, nausea, vomiting, or diarrhea. 02/19/2020 on evaluation today patient unfortunately does not appear to be doing quite as well as I would like to see. He has no signs of active infection at this time but unfortunately he is not doing well in regard to the overall appearance of his left leg. He has a couple other areas that are weeping now and the leg is much more swollen. I think that he needs to actually have a compression wrap to try to manage this. 02/26/2020 on evaluation today patient appears to be doing well with regard to his left lower extremity ulcer. Fortunately the swelling is down I do believe the pressure wrap seems to be doing quite well. Fortunately there is no signs of active infection at this time. 03/05/2020 upon evaluation today patient appears to be doing excellent in regard to his legs at this point. Fortunately there is no signs of active infection which is great news. Overall he feels like he is completely healed which is great news. Readmission: 05/20/2020 upon evaluation today patient appears to be doing well with regard to his leg ulcer all things considered. He is actually coming back to see [place] after having had a reopening of the ulcers  on his  left leg in the past several weeks. He is out of dressing supplies and his wife is been try to take care of this as best she can. 06/10/2020 upon evaluation today patient unfortunately appears to be doing significantly worse today compared to when I last saw him. He and his wife both tell me that "there has been a lot going on". I did not actually go into detail about everything that has been happening but nonetheless he has not obviously been wearing his compression. He brings them with him today but they were not on. I am not even certain to be honest that he could get those on at this point. Nonetheless I do believe that he is going to require compression wrapping at some point shortly but right now we need to try to get what appears to be infection under control at this time. 06/28/2020 upon evaluation today patient appears to be doing well with regard to 06/28/2020 upon evaluation today patient appears to be doing well pretty well in regard to his left leg which is much better than previous. With that being said in regard to his right leg he does seem to be having some issues with a new wound after he sustained a fall. This fortunately is not too deep but does appear to be something we need to address. Neither deep which is good. 07/05/2020 on evaluation today patient appears to be doing well with regard to his wounds. In fact everything on the left leg appears to be pretty much healed on the right leg this is very close though not completely closed yet. Fortunately there is no evidence of active infection at this time. No fevers, chills, nausea, vomiting, or diarrhea. 07/13/2019 upon evaluation today patient appears to be making good progress which is great news. Overall I am extremely pleased with where things stand today. There does not appear to be any signs of active infection which is great news and overall his left leg appears healed still the right leg though not healed does seem to be  making good progress which is excellent news. He is having no pain. 07/19/2020 on evaluation today patient appears to be doing well with regard to his wound. He has been tolerating the dressing changes without complication. Fortunately there does not appear to be any signs of active infection at this time. No fevers, chills, nausea, vomiting, or diarrhea. Readmission: 08/20/2020 on evaluation today patient presents for reevaluation here in the clinic concerning issues that he has been having with his bilateral lower extremities. Unfortunately this is an ongoing reoccurring issue. He tells me that he is really not certain exactly what caused the issue although as I discussed this further with him it appears that he has been sitting for the most part and sleeping in his chair with his feet on the ground he is not really elevating at all in fact the chair does not even have a reclining function therefore it is basically just him sitting with his feet on the ground. I think this alone has probably led to his increased swelling he is also having more difficulty walking which means that he is pumping less blood flow through his legs anyway as far as the venous stasis is concerned. All in all I think this is leading to a downward spiral of him not being able to walk very well as his legs are so heavy, they begin to weep, and overall he just does not seem to be doing as well as  it was when I last saw him. 08/27/2020 upon evaluation today patient's legs actually are showing signs of improvement which is great news. There does not appear to be any evidence of infection which is also excellent news. I am extremely pleased with where things stand today. No fevers, chills, nausea, vomiting, or diarrhea. 09/03/20 upon evaluation today patient appears to be doing excellent in regard to his wounds currently. He has been tolerating the dressing changes without complication and overall I am extremely pleased with where  things stand today. No fevers, chills, nausea, vomiting, or diarrhea. Overall he is healed on the right leg although the left leg not being completely healed still is doing significantly better. 09/10/2020/8/22 upon evaluation today patient appears to be doing well with regard to his wounds. He has been tolerating the dressing changes without complication. Fortunately he appears to be completely healed. I am very pleased in that regard. As is the patient his right leg is also doing well he did get a new recliner which is helping him keep his feet off the ground which I think is helped he is down 2 cm on the left compared to last week so I think there is some definite improvement here. RAVIS, HERNE ([id number]) Readmission 12/15/20: Mr. Jethro Radke is a 75 year old male with a past medical history of lymphedema, insulin-dependent type 2 diabetes, COPD and congestive heart failure that presents to the clinic for increased drainage from his legs bilaterally, Left greater than right. Patient has lymphedema. He has been seen in our clinic on multiple occasions for this issue. He currently denies signs of infection. He has not been using his lymphedema pumps. 12/23/2020 upon evaluation today patient appears to be doing well with regard to his wounds. He has been tolerating the dressing changes he really does not have any open wounds just weeping areas of the legs bilaterally and fortunately seems to be doing better at this point. 12/28/2020 upon evaluation today patient's wound is actually showing signs of good improvement. There does not appear to be any evidence of infection which is great news and overall I am extremely pleased at this point. I think is very close to complete resolution. 01/04/2021 upon evaluation today patient actually appears to be completely healed which is great news. I do not see any signs of active infection currently which is also great news. In general I am extremely pleased with  where we stand and I do believe that the patient is tolerating the dressing changes without complication. I believe he is ready to go back into his Velcro compression and I did see that they brought them with him today. Readmission: Patient presents today for follow-up he was last seen August 2. He is having some issues with some mild drainage and leaking from the left leg. This is something that has reopened and again is a recurrent issue for him. Subsequently he does wear his Velcro compression wraps though I think we may want to look into something a little bit better for him I am thinking that we may want to try the juxta fit compression which I think would do much more effective compression treatment overall for him. 04/08/2021 upon evaluation today patient appears to be doing well with regard to his legs. I do feel like he is showing signs of improvement which is great news. No fevers, chills, nausea, vomiting, or diarrhea. 04/15/2021 upon evaluation today patient's wounds actually appear to be very close to complete resolution. He is legs are  actually doing well left leg just has a small area of leaking at this point. Overall I think that we are very close to complete resolution based on what I am seeing. Nonetheless he seems to not be feeling too well today I really do not know what exactly is going on here. Electronic Signature(s) Signed: 04/15/2021 3:05:45 PM By: Worthy Keeler PA-C Entered By: Worthy Keeler on 04/15/2021 15:05:45 Adam Merritt ([id number]) -------------------------------------------------------------------------------- Physical Exam Details Patient Name: Adam Merritt Date of Service: 04/15/2021 1:15 PM Medical Record Number: [id number] Patient Account Number: [id number] Date of Birth/Sex: Nov 09, 1945 (75 y.o. M) Treating RN: Carlene Coria Primary Care Provider: [name] Other Clinician: Referring Provider: [name] Treating Provider/Extender: Jeri Cos [name]: 3 Constitutional Well-nourished and well-hydrated in no acute distress. Respiratory normal breathing without difficulty. Psychiatric this patient is able to make decisions and demonstrates good insight into disease process. Alert and Oriented x 3. pleasant and cooperative. Notes Upon inspection patient's wound bed actually showed signs of good granulation and epithelization at this point. Fortunately there does not appear to be any evidence of active infection at this time. No fevers, chills, nausea, vomiting, or diarrhea. Electronic Signature(s) Signed: 04/15/2021 3:06:03 PM By: Worthy Keeler PA-C Entered By: Worthy Keeler on 04/15/2021 15:06:02 Adam Merritt ([id number]) -------------------------------------------------------------------------------- Physician Orders Details Patient Name: Adam Merritt Date of Service: 04/15/2021 1:15 PM Medical Record Number: [id number] Patient Account Number: [id number] Date of Birth/Sex: [birth year] (75 y.o. M) Treating RN: Carlene Coria Primary Care Provider: [name] Other Clinician: Referring Provider: [name] Treating Provider/Extender: Skipper Cliche in Treatment: 3 Verbal / Phone Orders: No Diagnosis Coding ICD-10 Coding Code Description I89.0 Lymphedema, not elsewhere classified I87.2 Venous insufficiency (chronic) (peripheral) L97.812 Non-pressure chronic ulcer of other part of right lower leg with fat layer exposed L97.822 Non-pressure chronic ulcer of other part of left lower leg with fat layer exposed Follow-up Appointments o Return Appointment in 1 week. Bathing/ Shower/ Hygiene o May shower with wound dressing protected with water repellent cover or cast protector. Edema Control - Lymphedema / Segmental Compressive Device / Other o Elevate, Exercise Daily and Avoid Standing for Long Periods of Time. o Elevate legs to the level of the heart and pump ankles as often as possible o  Elevate leg(s) parallel to the floor when sitting. Off-Loading o Turn and reposition every 2 hours Wound Treatment Wound #41 - Lower Leg Wound Laterality: Left, Circumferential Primary Dressing: Silvercel 4 1/4x 4 1/4 (in/in) 1 x Per Week/30 Days Discharge Instructions: Apply Silvercel 4 1/4x 4 1/4 (in/in) as instructed Secondary Dressing: ABD Pad 5x9 (in/in) 1 x Per Week/30 Days Discharge Instructions: Cover with ABD pad Compression Wrap: Medichoice 4 layer Compression System, 35-40 mmHG 1 x Per Week/30 Days Discharge Instructions: Apply multi-layer wrap as directed. Electronic Signature(s) Signed: 04/15/2021 3:56:47 PM By: Carlene Coria RN Signed: 04/15/2021 4:22:43 PM By: Worthy Keeler PA-C Entered By: Carlene Coria on 04/15/2021 14:19:45 Adam Merritt ([id number]) -------------------------------------------------------------------------------- Problem List Details Patient Name: Adam Merritt Date of Service: 04/15/2021 1:15 PM Medical Record Number: [id number] Patient Account Number: [id number] Date of Birth/Sex: [birth year] (75 y.o. M) Treating RN: Carlene Coria Primary Care Provider: [name] Other Clinician: Referring Provider: [name] Treating Provider/Extender: Skipper Cliche in Treatment: 3 Active Problems ICD-10 Encounter Code Description Active Date MDM Diagnosis I89.0 Lymphedema, not elsewhere classified 03/25/2021 No Yes I87.2 Venous insufficiency (chronic) (peripheral) 03/25/2021 No Yes L97.812 Non-pressure chronic ulcer of  other part of right lower leg with fat layer 03/25/2021 No Yes exposed L97.822 Non-pressure chronic ulcer of other part of left lower leg with fat layer 03/25/2021 No Yes exposed Inactive Problems Resolved Problems Electronic Signature(s) Signed: 04/15/2021 1:04:17 PM By: Worthy Keeler PA-C Entered By: Worthy Keeler on 04/15/2021 13:04:16 Adam Merritt  ([id number]) -------------------------------------------------------------------------------- Progress Note Details Patient Name: Adam Merritt Date of Service: 04/15/2021 1:15 PM Medical Record Number: [id number] Patient Account Number: [id number] Date of Birth/Sex: 12-Aug-1945 (75 y.o. M) Treating RN: Carlene Coria Primary Care Provider: [name] Other Clinician: Referring Provider: [name] Treating Provider/Extender: Skipper Cliche in Treatment: 3 Subjective Chief Complaint Information obtained from Patient Bilateral LE edema and weeping ulcers History of Present Illness (HPI) 10/18/17-He is here for initial evaluation of bilateral lower extremity ulcerations in the presence of venous insufficiency and lymphedema. He has been seen by vascular medicine in the past, Dr. Lucky Cowboy, last seen in 2016. He does have a history of abnormal ABIs, which is to be expected given his lymphedema and venous insufficiency. According to Epic, it appears that all attempts for arterial evaluation and/or angiography were not follow through with by patient. He does have a history of being seen in lymphedema clinic in 2018, stopped going approximately 6 months ago stating "it didn't do any good". He does not have lymphedema pumps, he does not have custom fit compression wrap/stockings. He is diabetic and his recent A1c last month was 7.6. He admits to chronic bilateral lower extremity pain, no change in pain since blister and ulceration development. He is currently being treated with Levaquin for bronchitis. He has home health and we will continue. 10/25/17-He is here in follow-up evaluation for bilateral lower extremity ulcerationssubtle he remains on Levaquin for bronchitis. Right lower extremity with no evidence of drainage or ulceration, persistent left lower extremity ulceration. He states that home health has not been out since his appointment. He went to [facility] on Tuesday, studies  revealed: RIGHT ABI 0.9, TBI 0.6 LEFT ABI 1.1, TBI 0.6 with triphasic flow bilaterally. We will continue his same treatment plan. He has been educated on compression therapy and need for elevation. He will benefit from lymphedema pumps 11/01/17-He is here in follow-up evaluation for left lower extremity ulcer. The right lower extremity remains healed. He has home health services, but they have not been out to see the patient for 2-3 weeks. He states it home health physical therapy changed his dressing yesterday after therapy; he placed Ace wrap compression. We are still waiting for lymphedema pumps, reordered d/t need for company change. 11/08/17-He is here in follow-up evaluation for left lower extremity ulcer. It is improved. Edema is significantly improved with compression therapy. We will continue with same treatment plan and he will follow-up next week. No word regarding lymphedema pumps 11/15/17-He is here in follow-up evaluation for left lower extremity ulcer. He is healed and will be discharged from wound care services. I have reached out to [facility] regarding his lymphedema pumps. They have been unable to reach the patient; the contact number they had with the patient's wife's cell phone and she has not answered any unrecognized calls. Contact should be made today, trial planned for next week; [facility] will continue to follow 11/27/17 on evaluation today patient has multiple blistered areas over the right lower extremity his left lower extremity appears to be doing okay. These blistered areas show signs of no infection which is great news. With that  being said he did have some necrotic skin overlying which was mechanically debrided away with saline and gauze today without complication. Overall post debridement the wounds appear to be doing better but in general his swelling seems to be increased. This is obviously not good news. I think this is what has given rise to the  blisters. 12/04/17 on evaluation today patient presents for follow-up concerning his bilateral lower extremity edema in the right lower extremity ulcers. He has been tolerating the dressing changes without complication. With that being said he has had no real issues with the wraps which is also good news. Overall I'm pleased with the progress he's been making. 12/11/17 on evaluation today patient appears to be doing rather well in regard to his right lateral lower extremity ulcer. He's been tolerating the dressing changes without complication. Fortunately there does not appear to be any evidence of infection at this time. Overall I'm pleased with the progress that is being made. Unfortunately he has been in the hospital due to having what sounds to be a stomach virus/flu fortunately that is starting to get better. 12/18/17 on evaluation today patient actually appears to be doing very well in regard to his bilateral lower extremities the swelling is under fairly good control his lymphedema pumps are still not up and running quite yet. With that being said he does have several areas of opening noted as far as wounds are concerned mainly over the left lower extremity. With that being said I do believe once he gets lymphedema pumps this would least help you mention some the fluid and preventing this from occurring. Hopefully that will be set up soon sleeves are Artie in place at his home he just waiting for the machine. 12/25/17 on evaluation today patient actually appears to be doing excellent in fact all of his ulcers appear to have resolved his legs appear very well. I do think he needs compression stockings we have discussed this and they are actually going to go to [place] today to elastic therapy to get this fitted for him. I think that is definitely a good thing to do. Readmission: 04/09/18 upon evaluation today this patient is seen for readmission due to bilateral lower extremity lymphedema. He has  significant swelling of his extremities especially on the left although the right is also swollen he has weeping from both sides. There are no obvious open wounds at this point. Fortunately he has been doing fairly well for quite a bit of time since I last saw him. Nonetheless unfortunately this seems to have reopened and is giving quite a bit of trouble. He states this began about a week ago when he first called [place] to get in to be seen. No fevers, chills, nausea, or vomiting noted at this time. He has not been using his lymphedema pumps due to the fact that they won't fit on his leg at this point likewise is also not been using his compression for essentially the same reason. 04/16/18 upon evaluation today patient actually appears to be doing a little better in regard to the fluid in his bilateral lower extremities. With that being said he's had three falls since I saw him last week. He also states that he's been feeling very poorly. I was concerned last week and feel like that the concern is still there as far as the congestion in his chest is concerned he seems to be breathing about the same as last week but again he states he's very weak he's  not even able to walk further than from the chair to the door. His wife had to buy a wheelchair just to be able to get them out of the house to get to the appointment today. This has me very concerned. JABRIL, PURSELL ([id number]) 04/23/18 on evaluation today patient actually appears to be doing much better than last week's evaluation. At that time actually had to transport him to the ER via EMS and he subsequently was admitted for acute pulmonary edema, acute renal failure, and acute congestive heart failure. Fortunately he is doing much better. Apparently they did dialyze him and were able to take off roughly 35 pounds of fluid. Nonetheless he is feeling much better both in regard to his breathing and he's able to get around much better at this time compared  to previous. Overall I'm very happy with how things are at this time. There does not appear to be any evidence of infection currently. No fevers, chills, nausea, or vomiting noted at this time. 04/30/2018 patient seen today for follow-up and management of bilateral lower extremity lymphedema. He did express being more sad today than usual due to the recent loss of his dog. He states that he has been compliant with using the lymphedema pumps. However he does admit a minute over the last 2-3 days he has not been using the pumps due to the recent loss of his dog. At this time there is no drainage or open wounds to his lower extremities. The left leg edema is measuring smaller today. Still has a significant amount of edema on bilateral lower extremities With dry flaky skin. He will be referred to the lymphedema clinic for further management. Will continue 3 layer compression wraps and follow-up in 1-2 weeks.Denies any pain, fever, chairs recently. No recent falls or injuries reported during this visit. 05/07/18 on evaluation today patient actually appears to be doing very well in regard to his lower extremities in general all things considered. With that being said he is having some pain in the legs just due to the amount of swelling. He does have an area where he had a blister on the left lateral lower extremity this is open at this point other than that there's nothing else weeping at this time. 05/14/18 on evaluation today patient actually appears to be doing excellent all things considered in regard to his lower extremities. He still has a couple areas of weeping on each leg which has continued to be the issue for him. He does have an appointment with the lymphedema clinic although this isn't until February 2020. That was the earliest they had. In the meantime he has continued to tolerate the compression wraps without complication. 05/28/18 on evaluation today patient actually appears to be doing more  poorly in regard to his left lower extremity where he has a wound open at this point. He also had a fall where he subsequently injured his right great toe which has led to an open wound at the site unfortunately. He has been tolerating the dressing changes without complication in general as far as the wraps are concerned that he has not been putting any dressing on the left 1st toe ulcer site. 06/11/18 on evaluation today patient appears to be doing much worse in regard to his bilateral lower extremity ulcers. He has been tolerating the dressing changes without complication although his legs have not been wrapped more recently. Overall I am not very pleased with the way his legs appear. I do believe  he needs to be back in compression wraps he still has not received his compression wraps from the [facility] as of yet. 06/18/18 on evaluation today patient actually appears to be doing significantly better than last time I saw him. He has been tolerating the compression wraps without complication in the circumferential ulcers especially appear to be doing much better. His toe ulcer on the right in regard to the great toe is better although not as good as the legs in my pinion. No fevers chills noted 07/02/18 on evaluation today patient appears to be doing much better in regard to his lower extremity ulcers. Unfortunately since I last saw him he's had the distal portion of his right great toe if he dated it sounds as if this actually went downhill very quickly. I had only seen him a few days prior and the toe did not appear to be infected at that point subsequently became infected very rapidly and it was decided by the surgeon that the distal portion of the toe needed to be removed. The patient seems to be doing well in this regard he tells me. With that being said his lower extremities are doing better from the standpoint of the wounds although he is significantly swollen at this point. 07/09/18 on evaluation  today patient appears to be doing better in regard to the wounds on his lower extremities. In fact everything is almost completely healed he is just a small area on the left posterior lower extremity that is open at this point. 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 With that being said he needs some kind of compression it does sound like he may not be wearing his compression, that is the wraps, during the entire time between when he's here visit to visit. Apparently his wife took the current one off because it began to "fall apart". 07/16/18 on evaluation today patient appears to be extremely swollen especially in regard to his left lower extremity unfortunately. He also has a new skin tear over the left lower extremity and there's a smaller area on the right lower extremity as well. Unfortunately this seems to be due in part to blistering and fluid buildup in his leg. He did get the reduction wraps that were ordered by the [facility] for him to go to lymphedema clinic. With that being said his wounds on the legs have not healed to the point to where they would likely accept them as a patient lymphedema clinic currently. We need to try to get this to heal. With that being said he's been taking his wraps off which is not doing him any favors at this point. In fact this is probably quite counterproductive compared to what needs to occur. We will likely need to increase to a four layer compression wrap and continue to also utilize elevation and he has to keep the wraps on not take them off as he's been doing currently hasn't had a wrap on since Saturday. 07/23/18 on evaluation today patient appears to be doing much better in regard to his bilateral lower extremities. In fact his left lower extremity which was the  largest is actually 15 cm smaller today compared to what it was last time he was here in our clinic. This is obviously good news after just one week. Nonetheless the differences  he actually kept the wraps on during the entire week this time. That's not typical for him. I do believe he understands a little bit better now the severity of the situation and why it's important for him to keep these wraps on. 07/30/18 on evaluation today patient actually appears to be doing rather well in regard to his lower extremities. His legs are much smaller than they have been in the past and he actually has only one very small rudder superficial region remaining that is not closed on the left lateral/posterior lower extremity even this is almost completely close. I do believe likely next week he will be healed without any complications. I do think we need to continue the wraps however this seems to be beneficial for him. I also think it may be a good time for [place] to go ahead and see about getting the appointment with the lymphedema clinic which is supposed to be made for him in order to keep this moving along and hopefully get them into compression wraps that will in the end help him to remain healed. 08/06/18 on evaluation today patient actually appears to be doing very well in regard as bilateral lower extremities. In fact his wounds appear to be completely healed at this time. 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 He is in the process of getting appointment with the lymphedema clinic we have made this referral were just waiting to hear back on the schedule time. We need to follow up on that today as well. 08/13/18 on evaluation today patient actually appears to be doing very well in regard to his bilateral lower extremities there are no open wounds at this point. We are gonna go ahead and see about ordering the Velcro compression wraps for him had a  discussion with them about [place] doing it versus the [place] they feel like they can definitely afford going ahead and get the wraps themselves and they would prefer to try to avoid having to go to the lymphedema clinic if it all possible which I completely understand. As long as he has good compression I'm okay either way. ELLIOT, MELDRUM ([id number]) 3/17//20 on evaluation today patient actually appears to be doing well in regard to his bilateral lower extremity ulcers. He has been tolerating the dressing changes without complication specifically the compression wraps. Overall is had no issues my fingers finding that I see at this point is that he is having trouble with constipation. He tells me he has not been able to go to the bathroom for about six days. He's taken to over-the- counter oral laxatives unfortunately this is not helping. He has not contacted his doctor. 08/27/18 on evaluation today patient appears to be doing fairly well in regard to his lower extremities at this point. There does not appear to be any new altars is swelling is very well controlled. We are still waiting for his Velcro compression wraps to arrive that should be sometime in the next week is Artie sent the check for these. 09/03/18 on evaluation today patient appears to be doing excellent in regard to his bilateral lower extremities which he shows no signs of wound openings in regard to this point. He does have his Velcro compression wraps which did arrive in the mail since I saw him last week. Overall he is doing excellent in my pinion. 09/13/18 on evaluation today patient appears to be doing very well currently in regard to the overall  appearance of his bilateral lower extremities although he's a little bit more swollen than last time we saw him. At that point he been discharged without any open wounds. Nonetheless he has a small open wound on the posterior left lower extremity with some evidence of cellulitis noted as well.  Fortunately I feel like he has made good progress overall with regard to his lower extremities from were things used to be. 09/20/18 on evaluation today patient actually appears to be doing much better. The erythematous lower extremity is improving wound itself which is still open appears to be doing much better as far as but appearance as well as pain is concerned overall very pleased in this regard. There's no signs of active infection at this time. 09/27/18 on evaluation today patient's wounds on the lower extremity actually appear to be doing fairly well at this time which is good news. There is no evidence of active infection currently and again is just as left lower extremity were there any wounds at this point anyway. I believe they may be completely healed but again I'm not 100% sure based on evaluation today. I think one more week of observation would likely be a good idea. 10/04/18 on evaluation today patient actually appears to be doing excellent in regard to the left lower extremity which actually appears to be completely healed as of today. Unfortunately he's been having issues with his right lower extremity have a new wound that has opened. Fortunately there's no evidence of active infection at this time which is good news. 10/10/18 upon evaluation today patient actually appears to be doing a little bit worse with the new open area on his right posterior lower extremity. He's been tolerating the dressing changes without complication. Right now we been using his compression wraps although I think we may need to switch back to actually performing bilateral compression wraps are in the clinic. No fevers, chills, nausea, or vomiting noted at this time. 10/17/18 on evaluation today patient actually appears to be doing quite well in regard to his bilateral lower extremity ulcers. He has been tolerating the reps without complication although he would prefer not to rewrap his legs as of today.  Fortunately there's no signs of active infection which is good news. No fevers, chills, nausea, or vomiting noted at this time. 10/24/18 on evaluation today patient appears to be doing very well in regard to his lower extremities. His right lower extremity is shown signs of healing and his left lower Trinity though not healed appears to be improving which is excellent news. Overall very pleased with how things seem to be progressing at this time. The patient is likewise happy to hear this. His Velcro compression wraps however have not been put on properly will gonna show his wife how to do that properly today. 10/31/18 on evaluation today patient appears to be doing more poorly in regard to his left lower extremity in particular although both lower extremities actually are showing some signs of being worse than my previous evaluation. Unfortunately I'm just not sure that his compression stockings even with the use of the compression/lymphedema pumps seem to be controlling this well. Upon further questioning he tells me that he also is not able to lie flat in the bed due to his congestive heart failure and difficulty breathing. For that reason he sleeps in his recliner. To make matters worse his recliner also cannot even hold his legs up so instead of even being somewhat elevated they pretty much  hang down to the floor. This is the way he sleeps each night which is definitely counterproductive to everything else that were attempting to do from the standpoint of controlling his fluid. Nonetheless I think that he potentially could benefit from a hospital bed although this would be something that his primary care provider would likely have to order since anything that is order on our side has to be directly related to wound care and again the hospital bed is not necessarily a direct relation to although I think it does contribute to his overall wound status on his lower extremities. 11/07/18 on evaluation  today patient appears to be doing better in regard to his bilateral lower extremity. He's been tolerating the dressing changes without complication. We did in the interim since I last saw him switch to just using extras orbiting new alginate and that seems to have done much better for him. I'm very pleased with the overall progress that is made. 11/14/18 on evaluation today patient appears to be redoing rather well in regard to his left lower extremity ulcers which are the only ones remaining at this point. Fortunately there's no signs of active infection at this time which is good news. Overall been very pleased with how things seem to be progressing currently. No fevers, chills, nausea, or vomiting noted at this time. 11/20/17 on evaluation today patient actually appears to be doing quite well in regard to his lower extremities on the left on the right he has several blisters that showed up although there's some question about whether or not he has had his broker compression wraps on like he was supposed to or not. Fortunately there's no signs of active infection at this time which is good news. Unfortunately though he is doing well from the standpoint of the left leg the right leg is not doing as well again this is when we did not wrap last week. 11/28/18 on evaluation today patient appears to be doing well in regard to his bilateral lower extremities is left appears to be healed is right is not healed but is very close to being so. Overall very pleased with how things seem to be progressing. Patient is likewise happy that things are doing well. 12/09/18 on evaluation today patient actually appears to be completely healed which is excellent news. He actually seems to be doing well in regard to the swelling of the bilateral lower extremities which is also great news. Overall very pleased with how things seem to be progressing. Readmission: 03/18/2019 upon evaluation today patient presents for  reevaluation concerning issues that he is having with his left lower extremity where he does have a wound noted upon inspection today. He also has a wound on the right second toe on the tip where it is apparently been rubbing in his shoe I did look at issues and you can actually see where this has been occurring as well. Fortunately there is no signs of infection with regard to LOLA, LOFARO. ([id number]) the toe necessarily although it is very swollen compared to normal that does have me somewhat concerned about the possibility of further evaluation for infection/osteomyelitis. I would recommend an x-ray to start with. Otherwise he states that it is been 1-2 weeks that he has had the draining in regard to left lower extremity he does not know how this happened he has been wearing his compression appropriately and I do see that as well today but nonetheless I do think that he needs to continue  to be very cautious with regard to elevation as well. 03/25/2019 on evaluation today patient appears to be doing much better in regard to his lower extremity at this time. That is on the left. He did culture positive for Pseudomonas but the good news is he seems to be doing much better the gentamicin we applied topically seems to have done a great job for him. There is no signs of active infection at this time systemically and locally he is doing much better. No fevers, chills, nausea, vomiting, or diarrhea. In regard to his right toe this seems to be doing much better there is very little pressure noted at this point I did clean up some of the callus today around the edges of the wound as well as the surface of the wound but to be honest he is progressing quite nicely. 10/30; the area on the left anterior lower tibia area is healed over. His edema control is adequate even though his use of the compression pumps seems very intermittent. He has a juxta lite stocking on the right leg and a think there is 1 for the  left leg at home. His wife is putting these on. He has an area on the plantar right second toe which is a hammertoe they are trying to offload this 04/08/2019 on evaluation today patient appears to be doing well in regard to his lower extremities which is good news. We are seeing him today for his toe ulcer which is giving him a little bit more trouble but again still seems to be doing better at this point which is good news. He is going require some sharp debridement in regard to the toe today however. 04/15/2019 on evaluation today patient actually appears to be doing quite well with regard to his toe ulcer. I am very pleased with how things are doing in that regard. With regard to his lower extremity edema in general he tells me he is not been using the lymphedema pumps regularly although he does not use them. I think that he needs to use them more regularly he does have a small blister on the left lower extremity this is not open at this point but obviously it something that can get worse if he does not keep the compression going and the pumps going as well. 04/22/2019 on evaluation today patient appears to be doing well with regard to his toe ulcer. He has been tolerating the dressing changes without complication. This is measuring slightly smaller this week as compared to last week. Fortunately there is no signs of active infection at this time. 05/06/2019 on evaluation today patient actually appears to be doing excellent. In fact his toe appears to be completely healed which is great news. There is no signs of active infection at this time. Readmission: Patient presents today for follow-up after having been discharged at the beginning of December 2020. He states that in recent weeks things have reopened and has been having more trouble at this point. With that being said fortunately there is no signs of active infection at this time. No fever chills noted. Nonetheless he also does have an issue  with one of his toes as well that seems to be somewhat this is on the right foot second toe. 07/25/2019 upon evaluation today patient's lower extremities appear to be doing excellent bilaterally. I really feel like he is showing signs of great improvement and in fact I think he is close to healing which is great news. There  is no signs of active infection at this time. No fevers, chills, nausea, vomiting, or diarrhea. 07/31/2019 upon evaluation today patient appears to be doing excellent and in fact I think may be completely healed in regard to his right lower extremity that were still to monitor this 1 more week before closing it out. The left lower extremity is measuring smaller although not completely closed seems to be doing excellent. 08/07/2019 upon evaluation today patient appears to be doing excellent in regard to his wounds. In fact the right lower extremity is completely healed there is no issues here. The left lower extremity has 1 very small area still remaining fortunately there is no signs of infection and overall I think he is very close to closure of the here as well. 08/14/19 upon evaluation today patient appears to be doing excellent in regard to his lower extremities. In fact he appears to be completely healed as of today. Fortunately there's no signs of active infection at this time. No fevers, chills, nausea, or vomiting noted at this time. READMISSION 09/26/2019 This is a 75 year old man who is well-known to this clinic having just been discharged on 08/14/2019. He has known lymphedema and chronic venous insufficiency. He has Farrow wrap stockings and also external compression pumps. He does not use the external compression pumps however. He does however apparently fairly faithfully use the stockings. They have not been lotion in his legs. He has developed new wounds on the right anterior as well as the left medial left lateral and left posterior calf. He returns for our review of  this Past medical history includes coronary artery disease, hypertension, congestive heart failure, COPD, venous insufficiency with lymphedema, type 2 diabetes. He apparently has compression pumps, Farrow wraps and at 1 point a hospital bed although I believe he sleeps in a recliner 10/03/2019 upon evaluation today patient appears to be doing better with regard to his wounds in general. He has been tolerating the dressing changes without complication. Fortunately there is no signs of active infection. No fevers, chills, nausea, vomiting, or diarrhea. I do believe the compression wraps are helping as they always have in the past. 10/10/2019 upon evaluation today patient appears to be doing excellent at this point. His right lower extremity is measuring much better and in fact is completely healed. His left lower extremity is also measuring better though not healed seems to be doing excellent at this point which is great news. Overall I am very pleased with how things appear currently. 10/27/2019 upon evaluation today patient appears to be doing quite well with regard to his wounds currently. He has been tolerating the dressing changes without complication. Fortunately there is no signs of active infection at this time. In fact he is almost completely healed on the left and has just a very small area open and on the right he is completely healed. 11/04/2019 upon evaluation today patient appears to be doing quite well with regard to his wounds. In fact he appears to be quite possibly completely healed on the left lower extremity now as well the right lower extremity is still doing well. With that being said unfortunately though this may be healed I think it is a very fragile area and I cannot even confirm there is not a very small opening still remaining. I really think he may benefit from 1 additional weeks wrapping before discontinuing. REDDING, CLOE ([id number]) 11/10/2019 upon evaluation today patient  appears to be doing well in regard to his original wounds  in fact everything that we were treating last week is completely healed on the left. Unfortunately he has a new skin tear just above where the wrap slipped down due to a fall he sustained causing this injury. 11/17/2019 on evaluation today patient appears to be doing better with regard to his wound. He has been tolerating the dressing changes without complication. Fortunately there is no signs of active infection at this time. No fevers, chills, nausea, vomiting, or diarrhea. 11/24/2019 upon evaluation today patient appears to be doing well with regard to his wound. This is making good progress there does not appear to be signs of active infection but overall I do feel like he is headed in the correct direction. Overall he is tolerating the compression wrap as well. 12/02/2019 upon evaluation today patient appears to be doing well with regard to his leg ulcer. In fact this appears to be completely healed and this is the last of the wounds that he had but still the left anterior lower extremity. Fortunately there is no signs of active infection at this time. No fevers, chills, nausea, vomiting, or diarrhea. Readmission: 02/05/2020 on evaluation today patient appears to be doing well with regard to his wound all things considered. He actually tells me that a couple weeks ago he had trouble with his wraps and therefore took him off for a couple of days in order to give his legs a break. It was during this time that he actually developed increased swelling and edema of his legs and blisters on both legs. That is when he called to make the appointment. Nonetheless since that time he began wearing his compression wraps which are Velcro in the meantime and has done extremely well with this. In fact his leg appears to be under good control as far as edema is concerned today it is nothing like what I am seeing when he has come in for evaluations in the  past. They have been using some of the silver alginate at home which has been beneficial for him. Fortunately there is no signs of active infection at this time. No fevers, chills, nausea, vomiting, or diarrhea. 02/19/2020 on evaluation today patient unfortunately does not appear to be doing quite as well as I would like to see. He has no signs of active infection at this time but unfortunately he is not doing well in regard to the overall appearance of his left leg. He has a couple other areas that are weeping now and the leg is much more swollen. I think that he needs to actually have a compression wrap to try to manage this. 02/26/2020 on evaluation today patient appears to be doing well with regard to his left lower extremity ulcer. Fortunately the swelling is down I do believe the pressure wrap seems to be doing quite well. Fortunately there is no signs of active infection at this time. 03/05/2020 upon evaluation today patient appears to be doing excellent in regard to his legs at this point. Fortunately there is no signs of active infection which is great news. Overall he feels like he is completely healed which is great news. Readmission: 05/20/2020 upon evaluation today patient appears to be doing well with regard to his leg ulcer all things considered. He is actually coming back to see [place] after having had a reopening of the ulcers on his left leg in the past several weeks. He is out of dressing supplies and his wife is been try to take care of this as  best she can. 06/10/2020 upon evaluation today patient unfortunately appears to be doing significantly worse today compared to when I last saw him. He and his wife both tell me that "there has been a lot going on". I did not actually go into detail about everything that has been happening but nonetheless he has not obviously been wearing his compression. He brings them with him today but they were not on. I am not even certain to be honest that he  could get those on at this point. Nonetheless I do believe that he is going to require compression wrapping at some point shortly but right now we need to try to get what appears to be infection under control at this time. 06/28/2020 upon evaluation today patient appears to be doing well with regard to 06/28/2020 upon evaluation today patient appears to be doing well pretty well in regard to his left leg which is much better than previous. With that being said in regard to his right leg he does seem to be having some issues with a new wound after he sustained a fall. This fortunately is not too deep but does appear to be something we need to address. Neither deep which is good. 07/05/2020 on evaluation today patient appears to be doing well with regard to his wounds. In fact everything on the left leg appears to be pretty much healed on the right leg this is very close though not completely closed yet. Fortunately there is no evidence of active infection at this time. No fevers, chills, nausea, vomiting, or diarrhea. 07/13/2019 upon evaluation today patient appears to be making good progress which is great news. Overall I am extremely pleased with where things stand today. There does not appear to be any signs of active infection which is great news and overall his left leg appears healed still the right leg though not healed does seem to be making good progress which is excellent news. He is having no pain. 07/19/2020 on evaluation today patient appears to be doing well with regard to his wound. He has been tolerating the dressing changes without complication. Fortunately there does not appear to be any signs of active infection at this time. No fevers, chills, nausea, vomiting, or diarrhea. Readmission: 08/20/2020 on evaluation today patient presents for reevaluation here in the clinic concerning issues that he has been having with his bilateral lower extremities. Unfortunately this is an ongoing  reoccurring issue. He tells me that he is really not certain exactly what caused the issue although as I discussed this further with him it appears that he has been sitting for the most part and sleeping in his chair with his feet on the ground he is not really elevating at all in fact the chair does not even have a reclining function therefore it is basically just him sitting with his feet on the ground. I think this alone has probably led to his increased swelling he is also having more difficulty walking which means that he is pumping less blood flow through his legs anyway as far as the venous stasis is concerned. All in all I think this is leading to a downward spiral of him not being able to walk very well as his legs are so heavy, they begin to weep, and overall he just does not seem to be doing as well as it was when I last saw him. 08/27/2020 upon evaluation today patient's legs actually are showing signs of improvement which is great news. There  does not appear to be any evidence of infection which is also excellent news. I am extremely pleased with where things stand today. No fevers, chills, nausea, vomiting, or diarrhea. 09/03/20 upon evaluation today patient appears to be doing excellent in regard to his wounds currently. He has been tolerating the dressing changes without complication and overall I am extremely pleased with where things stand today. No fevers, chills, nausea, vomiting, or diarrhea. Overall he is healed on the right leg although the left leg not being completely healed still is doing significantly better. KOSTANTINOS, TALLMAN ([id number]) 09/10/2020/8/22 upon evaluation today patient appears to be doing well with regard to his wounds. He has been tolerating the dressing changes without complication. Fortunately he appears to be completely healed. I am very pleased in that regard. As is the patient his right leg is also doing well he did get a new recliner which is helping him keep  his feet off the ground which I think is helped he is down 2 cm on the left compared to last week so I think there is some definite improvement here. Readmission 12/15/20: Mr. Cody Albus is a 75 year old male with a past medical history of lymphedema, insulin-dependent type 2 diabetes, COPD and congestive heart failure that presents to the clinic for increased drainage from his legs bilaterally, Left greater than right. Patient has lymphedema. He has been seen in our clinic on multiple occasions for this issue. He currently denies signs of infection. He has not been using his lymphedema pumps. 12/23/2020 upon evaluation today patient appears to be doing well with regard to his wounds. He has been tolerating the dressing changes he really does not have any open wounds just weeping areas of the legs bilaterally and fortunately seems to be doing better at this point. 12/28/2020 upon evaluation today patient's wound is actually showing signs of good improvement. There does not appear to be any evidence of infection which is great news and overall I am extremely pleased at this point. I think is very close to complete resolution. 01/04/2021 upon evaluation today patient actually appears to be completely healed which is great news. I do not see any signs of active infection currently which is also great news. In general I am extremely pleased with where we stand and I do believe that the patient is tolerating the dressing changes without complication. I believe he is ready to go back into his Velcro compression and I did see that they brought them with him today. Readmission: Patient presents today for follow-up he was last seen August 2. He is having some issues with some mild drainage and leaking from the left leg. This is something that has reopened and again is a recurrent issue for him. Subsequently he does wear his Velcro compression wraps though I think we may want to look into something a little bit  better for him I am thinking that we may want to try the juxta fit compression which I think would do much more effective compression treatment overall for him. 04/08/2021 upon evaluation today patient appears to be doing well with regard to his legs. I do feel like he is showing signs of improvement which is great news. No fevers, chills, nausea, vomiting, or diarrhea. 04/15/2021 upon evaluation today patient's wounds actually appear to be very close to complete resolution. He is legs are actually doing well left leg just has a small area of leaking at this point. Overall I think that we are very close to  complete resolution based on what I am seeing. Nonetheless he seems to not be feeling too well today I really do not know what exactly is going on here. Objective Constitutional Well-nourished and well-hydrated in no acute distress. Vitals Time Taken: 1:41 PM, Height: 66 in, Weight: 300 lbs, BMI: 48.4, Temperature: 98.3 F, Pulse: 78 bpm, Respiratory Rate: 18 breaths/min, Blood Pressure: 156/78 mmHg. Respiratory normal breathing without difficulty. Psychiatric this patient is able to make decisions and demonstrates good insight into disease process. Alert and Oriented x 3. pleasant and cooperative. General Notes: Upon inspection patient's wound bed actually showed signs of good granulation and epithelization at this point. Fortunately there does not appear to be any evidence of active infection at this time. No fevers, chills, nausea, vomiting, or diarrhea. Integumentary (Hair, Skin) Wound #41 status is Open. Original cause of wound was Gradually Appeared. The date acquired was: 03/05/2021. The wound has been in treatment 3 weeks. The wound is located on the Left,Circumferential Lower Leg. The wound measures 3cm length x 2cm width x 0.1cm depth; 4.712cm^2 area and 0.471cm^3 volume. There is Fat Layer (Subcutaneous Tissue) exposed. There is no tunneling or undermining noted. There is a medium  amount of serosanguineous drainage noted. There is large (67-100%) granulation within the wound bed. There is no necrotic tissue within the wound bed. Wound #42 status is Healed - Epithelialized. Original cause of wound was Gradually Appeared. The date acquired was: 03/05/2021. The wound has been in treatment 3 weeks. The wound is located on the Left,Medial Lower Leg. The wound measures 0cm length x 0cm width x 0cm depth; 0cm^2 area and 0cm^3 volume. There is Fat Layer (Subcutaneous Tissue) exposed. There is no tunneling or undermining noted. There is a medium amount of serosanguineous drainage noted. There is large (67-100%) granulation within the wound bed. There is no necrotic tissue within the wound bed. CORRELL, DENBOW ([id number]) Assessment Active Problems ICD-10 Lymphedema, not elsewhere classified Venous insufficiency (chronic) (peripheral) Non-pressure chronic ulcer of other part of right lower leg with fat layer exposed Non-pressure chronic ulcer of other part of left lower leg with fat layer exposed Procedures Wound #41 Pre-procedure diagnosis of Wound #41 is a Venous Leg Ulcer located on the Left,Circumferential Lower Leg . There was a Four Layer Compression Therapy Procedure by Carlene Coria, RN. Post procedure Diagnosis Wound #41: Same as Pre-Procedure There was a Four Layer Compression Therapy Procedure by Carlene Coria, RN. Post procedure Diagnosis Wound #: Same as Pre-Procedure Plan Follow-up Appointments: Return Appointment in 1 week. Bathing/ Shower/ Hygiene: May shower with wound dressing protected with water repellent cover or cast protector. Edema Control - Lymphedema / Segmental Compressive Device / Other: Elevate, Exercise Daily and Avoid Standing for Long Periods of Time. Elevate legs to the level of the heart and pump ankles as often as possible Elevate leg(s) parallel to the floor when sitting. Off-Loading: Turn and reposition every 2 hours WOUND #41: - Lower  Leg Wound Laterality: Left, Circumferential Primary Dressing: Silvercel 4 1/4x 4 1/4 (in/in) 1 x Per Week/30 Days Discharge Instructions: Apply Silvercel 4 1/4x 4 1/4 (in/in) as instructed Secondary Dressing: ABD Pad 5x9 (in/in) 1 x Per Week/30 Days Discharge Instructions: Cover with ABD pad Compression Wrap: Medichoice 4 layer Compression System, 35-40 mmHG 1 x Per Week/30 Days Discharge Instructions: Apply multi-layer wrap as directed. 1. Would recommend currently that we going to continue with the wound care measures as before and the patient is in agreement with the plan. This includes the  compression wraps bilaterally though I think were probably can to be done next week and ready for discharge. He does have Velcro compression wraps following. 2. Also can recommend that we continue with the 4-layer compression wrap currently along with silver alginate to the open area that is draining and leaking currently. We will see patient back for reevaluation in 1 week here in the clinic. If anything worsens or changes patient will contact our office for additional recommendations. Electronic Signature(s) Signed: 04/15/2021 3:06:34 PM By: Worthy Keeler PA-C Entered By: Worthy Keeler on 04/15/2021 15:06:33 Adam Merritt ([id number]) -------------------------------------------------------------------------------- SuperBill Details Patient Name: Adam Merritt Date of Service: 04/15/2021 Medical Record Number: [id number] Patient Account Number: [id number] Date of Birth/Sex: February 18, 1946 (75 y.o. M) Treating RN: Carlene Coria Primary Care Provider: [name] Other Clinician: Referring Provider: [name] Treating Provider/Extender: Skipper Cliche in Treatment: 3 Diagnosis Coding ICD-10 Codes Code Description I89.0 Lymphedema, not elsewhere classified I87.2 Venous insufficiency (chronic) (peripheral) L97.812 Non-pressure chronic ulcer of other part of right lower leg with fat layer  exposed L97.822 Non-pressure chronic ulcer of other part of left lower leg with fat layer exposed Facility Procedures CPT4: Description Modifier Quantity Code [id number] BILATERAL: Application of multi-layer venous compression system; leg (below knee), including 1 ankle and foot. Physician Procedures CPT4 Code: 3299242 Description: 68341 - WC PHYS LEVEL 4 - EST PT Modifier: Quantity: 1 CPT4 Code: Description: ICD-10 Diagnosis Description I89.0 Lymphedema, not elsewhere classified I87.2 Venous insufficiency (chronic) (peripheral) L97.812 Non-pressure chronic ulcer of other part of right lower leg with fat la L97.822 Non-pressure chronic ulcer of  other part of left lower leg with fat lay Modifier: yer exposed er exposed Quantity: Electronic Signature(s) Signed: 04/15/2021 3:10:00 PM By: Worthy Keeler PA-C Previous Signature: 04/15/2021 2:22:20 PM Version By: Carlene Coria RN Entered By: Worthy Keeler on 04/15/2021 15:10:00

## 2021-04-22 ENCOUNTER — Other Ambulatory Visit: Payer: Self-pay

## 2021-04-22 ENCOUNTER — Encounter: Payer: Medicare PPO | Admitting: Physician Assistant

## 2021-04-22 DIAGNOSIS — I872 Venous insufficiency (chronic) (peripheral): Secondary | ICD-10-CM | POA: Diagnosis not present

## 2021-04-22 DIAGNOSIS — L97822 Non-pressure chronic ulcer of other part of left lower leg with fat layer exposed: Secondary | ICD-10-CM | POA: Diagnosis not present

## 2021-04-22 DIAGNOSIS — I890 Lymphedema, not elsewhere classified: Secondary | ICD-10-CM | POA: Diagnosis not present

## 2021-04-22 DIAGNOSIS — Z794 Long term (current) use of insulin: Secondary | ICD-10-CM | POA: Diagnosis not present

## 2021-04-22 DIAGNOSIS — I87313 Chronic venous hypertension (idiopathic) with ulcer of bilateral lower extremity: Secondary | ICD-10-CM | POA: Diagnosis not present

## 2021-04-22 DIAGNOSIS — E119 Type 2 diabetes mellitus without complications: Secondary | ICD-10-CM | POA: Diagnosis not present

## 2021-04-22 DIAGNOSIS — L97812 Non-pressure chronic ulcer of other part of right lower leg with fat layer exposed: Secondary | ICD-10-CM | POA: Diagnosis not present

## 2021-04-22 NOTE — Progress Notes (Addendum)
LISLE, SKILLMAN ([id number]) Visit Report for 04/22/2021 Chief Complaint Document Details Patient Name: Adam Merritt Date of Service: 04/22/2021 1:15 PM Medical Record Number: [id number] Patient Account Number: [id number] Date of Birth/Sex: 28-Feb-1946 (75 y.o. M) Treating RN: Carlene Coria Primary Care Provider: [name] Other Clinician: Referring Provider: [name] Treating Provider/Extender: Skipper Cliche in Treatment: 4 Information Obtained from: Patient Chief Complaint Bilateral LE edema and weeping ulcers Electronic Signature(s) Signed: 04/22/2021 1:29:02 PM By: Worthy Keeler PA-C Entered By: Worthy Keeler on 04/22/2021 13:29:01 Adam Merritt ([id number]) -------------------------------------------------------------------------------- HPI Details Patient Name: Adam Merritt Date of Service: 04/22/2021 1:15 PM Medical Record Number: [id number] Patient Account Number: [id number] Date of Birth/Sex: May 18, 1946 (75 y.o. M) Treating RN: Carlene Coria Primary Care Provider: [name] Other Clinician: Referring Provider: [name] Treating Provider/Extender: Skipper Cliche in Treatment: 4 History of Present Illness HPI Description: 10/18/17-He is here for initial evaluation of bilateral lower extremity ulcerations in the presence of venous insufficiency and lymphedema. He has been seen by vascular medicine in the past, Dr. Lucky Cowboy, last seen in 2016. He does have a history of abnormal ABIs, which is to be expected given his lymphedema and venous insufficiency. According to Epic, it appears that all attempts for arterial evaluation and/or angiography were not follow through with by patient. He does have a history of being seen in lymphedema clinic in 2018, stopped going approximately 6 months ago stating "it didn't do any good". He does not have lymphedema pumps, he does not have custom fit compression wrap/stockings. He is diabetic and his recent A1c last month was  7.6. He admits to chronic bilateral lower extremity pain, no change in pain since blister and ulceration development. He is currently being treated with Levaquin for bronchitis. He has home health and we will continue. 10/25/17-He is here in follow-up evaluation for bilateral lower extremity ulcerationssubtle he remains on Levaquin for bronchitis. Right lower extremity with no evidence of drainage or ulceration, persistent left lower extremity ulceration. He states that home health has not been out since his appointment. He went to [facility] on Tuesday, studies revealed: RIGHT ABI 0.9, TBI 0.6 LEFT ABI 1.1, TBI 0.6 with triphasic flow bilaterally. We will continue his same treatment plan. He has been educated on compression therapy and need for elevation. He will benefit from lymphedema pumps 11/01/17-He is here in follow-up evaluation for left lower extremity ulcer. The right lower extremity remains healed. He has home health services, but they have not been out to see the patient for 2-3 weeks. He states it home health physical therapy changed his dressing yesterday after therapy; he placed Ace wrap compression. We are still waiting for lymphedema pumps, reordered d/t need for company change. 11/08/17-He is here in follow-up evaluation for left lower extremity ulcer. It is improved. Edema is significantly improved with compression therapy. We will continue with same treatment plan and he will follow-up next week. No word regarding lymphedema pumps 11/15/17-He is here in follow-up evaluation for left lower extremity ulcer. He is healed and will be discharged from wound care services. I have reached out to [facility] regarding his lymphedema pumps. They have been unable to reach the patient; the contact number they had with the patient's wife's cell phone and she has not answered any unrecognized calls. Contact should be made today, trial planned for next week; [facility]  will continue to follow 11/27/17 on evaluation today patient has multiple blistered areas over the right  lower extremity his left lower extremity appears to be doing okay. These blistered areas show signs of no infection which is great news. With that being said he did have some necrotic skin overlying which was mechanically debrided away with saline and gauze today without complication. Overall post debridement the wounds appear to be doing better but in general his swelling seems to be increased. This is obviously not good news. I think this is what has given rise to the blisters. 12/04/17 on evaluation today patient presents for follow-up concerning his bilateral lower extremity edema in the right lower extremity ulcers. He has been tolerating the dressing changes without complication. With that being said he has had no real issues with the wraps which is also good news. Overall I'm pleased with the progress he's been making. 12/11/17 on evaluation today patient appears to be doing rather well in regard to his right lateral lower extremity ulcer. He's been tolerating the dressing changes without complication. Fortunately there does not appear to be any evidence of infection at this time. Overall I'm pleased with the progress that is being made. Unfortunately he has been in the hospital due to having what sounds to be a stomach virus/flu fortunately that is starting to get better. 12/18/17 on evaluation today patient actually appears to be doing very well in regard to his bilateral lower extremities the swelling is under fairly good control his lymphedema pumps are still not up and running quite yet. With that being said he does have several areas of opening noted as far as wounds are concerned mainly over the left lower extremity. With that being said I do believe once he gets lymphedema pumps this would least help you mention some the fluid and preventing this from occurring. Hopefully that will be set  up soon sleeves are Artie in place at his home he just waiting for the machine. 12/25/17 on evaluation today patient actually appears to be doing excellent in fact all of his ulcers appear to have resolved his legs appear very well. I do think he needs compression stockings we have discussed this and they are actually going to go to [place] today to elastic therapy to get this fitted for him. I think that is definitely a good thing to do. Readmission: 04/09/18 upon evaluation today this patient is seen for readmission due to bilateral lower extremity lymphedema. He has significant swelling of his extremities especially on the left although the right is also swollen he has weeping from both sides. There are no obvious open wounds at this point. Fortunately he has been doing fairly well for quite a bit of time since I last saw him. Nonetheless unfortunately this seems to have reopened and is giving quite a bit of trouble. He states this began about a week ago when he first called [place] to get in to be seen. No fevers, chills, nausea, or vomiting noted at this time. He has not been using his lymphedema pumps due to the fact that they won't fit on his leg at this point likewise is also not been using his compression for essentially the same reason. 04/16/18 upon evaluation today patient actually appears to be doing a little better in regard to the fluid in his bilateral lower extremities. With that being said he's had three falls since I saw him last week. He also states that he's been feeling very poorly. I was concerned last week and feel like that the concern is still there as far as the congestion  in his chest is concerned he seems to be breathing about the same as last week but again he states he's very weak he's not even able to walk further than from the chair to the door. His wife had to buy a wheelchair just to be able to get them out of the house to get to the appointment today. This has me very  concerned. 04/23/18 on evaluation today patient actually appears to be doing much better than last week's evaluation. At that time actually had to transport him to the ER via EMS and he subsequently was admitted for acute pulmonary edema, acute renal failure, and acute congestive heart failure. Fortunately he is doing much better. Apparently they did dialyze him and were able to take off roughly 35 pounds of fluid. Nonetheless he is feeling much better both in regard to his breathing and he's able to get around much better at this time compared to previous. Overall I'm very GENO, SYDNOR ([id number]) happy with how things are at this time. There does not appear to be any evidence of infection currently. No fevers, chills, nausea, or vomiting noted at this time. 04/30/2018 patient seen today for follow-up and management of bilateral lower extremity lymphedema. He did express being more sad today than usual due to the recent loss of his dog. He states that he has been compliant with using the lymphedema pumps. However he does admit a minute over the last 2-3 days he has not been using the pumps due to the recent loss of his dog. At this time there is no drainage or open wounds to his lower extremities. The left leg edema is measuring smaller today. Still has a significant amount of edema on bilateral lower extremities With dry flaky skin. He will be referred to the lymphedema clinic for further management. Will continue 3 layer compression wraps and follow-up in 1-2 weeks.Denies any pain, fever, chairs recently. No recent falls or injuries reported during this visit. 05/07/18 on evaluation today patient actually appears to be doing very well in regard to his lower extremities in general all things considered. With that being said he is having some pain in the legs just due to the amount of swelling. He does have an area where he had a blister on the left lateral lower extremity this is open at this  point other than that there's nothing else weeping at this time. 05/14/18 on evaluation today patient actually appears to be doing excellent all things considered in regard to his lower extremities. He still has a couple areas of weeping on each leg which has continued to be the issue for him. He does have an appointment with the lymphedema clinic although this isn't until February 2020. That was the earliest they had. In the meantime he has continued to tolerate the compression wraps without complication. 05/28/18 on evaluation today patient actually appears to be doing more poorly in regard to his left lower extremity where he has a wound open at this point. He also had a fall where he subsequently injured his right great toe which has led to an open wound at the site unfortunately. He has been tolerating the dressing changes without complication in general as far as the wraps are concerned that he has not been putting any dressing on the left 1st toe ulcer site. 06/11/18 on evaluation today patient appears to be doing much worse in regard to his bilateral lower extremity ulcers. He has been tolerating the dressing changes without complication  although his legs have not been wrapped more recently. Overall I am not very pleased with the way his legs appear. I do believe he needs to be back in compression wraps he still has not received his compression wraps from the [facility] hospital as of yet. 06/18/18 on evaluation today patient actually appears to be doing significantly better than last time I saw him. He has been tolerating the compression wraps without complication in the circumferential ulcers especially appear to be doing much better. His toe ulcer on the right in regard to the great toe is better although not as good as the legs in my pinion. No fevers chills noted 07/02/18 on evaluation today patient appears to be doing much better in regard to his lower extremity ulcers. Unfortunately since I last  saw him he's had the distal portion of his right great toe if he dated it sounds as if this actually went downhill very quickly. I had only seen him a few days prior and the toe did not appear to be infected at that point subsequently became infected very rapidly and it was decided by the surgeon that the distal portion of the toe needed to be removed. The patient seems to be doing well in this regard he tells me. With that being said his lower extremities are doing better from the standpoint of the wounds although he is significantly swollen at this point. 07/09/18 on evaluation today patient appears to be doing better in regard to the wounds on his lower extremities. In fact everything is almost completely healed he is just a small area on the left posterior lower extremity that is open at this point. 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 With that being said he needs some kind of compression it does sound like he may not be wearing his compression, that is the wraps, during the entire time between when he's here visit to visit. Apparently his wife took the current one off because it began to "fall apart". 07/16/18 on evaluation today patient appears to be extremely swollen especially in regard to his left lower extremity unfortunately. He also has a new skin tear over the left lower extremity and there's a smaller area on the right lower extremity as well. Unfortunately this seems to be due in part to blistering and fluid buildup in his leg. He did get the reduction wraps that were ordered by the [facility] hospital for him to go to lymphedema clinic. With that being said his wounds on the legs have not healed to the point to where they would likely accept them as a patient lymphedema clinic currently. We need to try to  get this to heal. With that being said he's been taking his wraps off which is not doing him any favors at this point. In fact this is probably quite counterproductive compared to what needs to occur. We will likely need to increase to a four layer compression wrap and continue to also utilize elevation and he has to keep the wraps on not take them off as he's been doing currently hasn't had a wrap on since Saturday. 07/23/18 on evaluation today patient appears to be doing much better in regard to his bilateral lower extremities. In fact his left lower extremity which was the largest is actually 15 cm smaller today compared  to what it was last time he was here in our clinic. This is obviously good news after just one week. Nonetheless the differences he actually kept the wraps on during the entire week this time. That's not typical for him. I do believe he understands a little bit better now the severity of the situation and why it's important for him to keep these wraps on. 07/30/18 on evaluation today patient actually appears to be doing rather well in regard to his lower extremities. His legs are much smaller than they have been in the past and he actually has only one very small rudder superficial region remaining that is not closed on the left lateral/posterior lower extremity even this is almost completely close. I do believe likely next week he will be healed without any complications. I do think we need to continue the wraps however this seems to be beneficial for him. I also think it may be a good time for [place] to go ahead and see about getting the appointment with the lymphedema clinic which is supposed to be made for him in order to keep this moving along and hopefully get them into compression wraps that will in the end help him to remain healed. 08/06/18 on evaluation today patient actually appears to be doing very well in regard as bilateral lower extremities. In fact his wounds appear to  be completely healed at this time. 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 He is in the process of getting appointment with the lymphedema clinic we have made this referral were just waiting to hear back on the schedule time. We need to follow up on that today as well. 08/13/18 on evaluation today patient actually appears to be doing very well in regard to his bilateral lower extremities there are no open wounds at this point. We are gonna go ahead and see about ordering the Velcro compression wraps for him had a discussion with them about [place] doing it versus the [place] they feel like they can definitely afford going ahead and get the wraps themselves and they would prefer to try to avoid having to go to the lymphedema clinic if it all possible which I completely understand. As long as he has good compression I'm okay either way. 3/17//20 on evaluation today patient actually appears to be doing well in regard to his bilateral lower extremity ulcers. He has been tolerating the dressing changes without complication specifically the compression wraps. Overall is had no issues my fingers finding that I see at this point is that he is having trouble with constipation. He tells me he has not been able to go to the bathroom for about six days. He's taken to over-the- counter oral laxatives unfortunately this is not helping. He has not contacted his doctor. Adam Merritt, Adam Merritt ([id number]) 08/27/18 on evaluation today patient appears to be doing fairly well in regard to his lower extremities at this point. There does not appear to be any new altars is swelling is very well controlled. We are still waiting for his Velcro compression wraps to arrive that should be sometime in the next week is Artie sent the check for these. 09/03/18 on evaluation today patient appears to be doing excellent in regard to his bilateral lower extremities which he shows no signs of  wound openings in regard to this point. He does have his Velcro compression wraps which did arrive in the mail since I saw him last week.  Overall he is doing excellent in my pinion. 09/13/18 on evaluation today patient appears to be doing very well currently in regard to the overall appearance of his bilateral lower extremities although he's a little bit more swollen than last time we saw him. At that point he been discharged without any open wounds. Nonetheless he has a small open wound on the posterior left lower extremity with some evidence of cellulitis noted as well. Fortunately I feel like he has made good progress overall with regard to his lower extremities from were things used to be. 09/20/18 on evaluation today patient actually appears to be doing much better. The erythematous lower extremity is improving wound itself which is still open appears to be doing much better as far as but appearance as well as pain is concerned overall very pleased in this regard. There's no signs of active infection at this time. 09/27/18 on evaluation today patient's wounds on the lower extremity actually appear to be doing fairly well at this time which is good news. There is no evidence of active infection currently and again is just as left lower extremity were there any wounds at this point anyway. I believe they may be completely healed but again I'm not 100% sure based on evaluation today. I think one more week of observation would likely be a good idea. 10/04/18 on evaluation today patient actually appears to be doing excellent in regard to the left lower extremity which actually appears to be completely healed as of today. Unfortunately he's been having issues with his right lower extremity have a new wound that has opened. Fortunately there's no evidence of active infection at this time which is good news. 10/10/18 upon evaluation today patient actually appears to be doing a little bit worse with the new  open area on his right posterior lower extremity. He's been tolerating the dressing changes without complication. Right now we been using his compression wraps although I think we may need to switch back to actually performing bilateral compression wraps are in the clinic. No fevers, chills, nausea, or vomiting noted at this time. 10/17/18 on evaluation today patient actually appears to be doing quite well in regard to his bilateral lower extremity ulcers. He has been tolerating the reps without complication although he would prefer not to rewrap his legs as of today. Fortunately there's no signs of active infection which is good news. No fevers, chills, nausea, or vomiting noted at this time. 10/24/18 on evaluation today patient appears to be doing very well in regard to his lower extremities. His right lower extremity is shown signs of healing and his left lower Trinity though not healed appears to be improving which is excellent news. Overall very pleased with how things seem to be progressing at this time. The patient is likewise happy to hear this. His Velcro compression wraps however have not been put on properly will gonna show his wife how to do that properly today. 10/31/18 on evaluation today patient appears to be doing more poorly in regard to his left lower extremity in particular although both lower extremities actually are showing some signs of being worse than my previous evaluation. Unfortunately I'm just not sure that his compression stockings even with the use of the compression/lymphedema pumps seem to be controlling this well. Upon further questioning he tells me that he also is not able to lie flat in the bed due to his congestive heart failure and difficulty breathing. For that reason he sleeps in his  recliner. To make matters worse his recliner also cannot even hold his legs up so instead of even being somewhat elevated they pretty much hang down to the floor. This is the way he  sleeps each night which is definitely counterproductive to everything else that were attempting to do from the standpoint of controlling his fluid. Nonetheless I think that he potentially could benefit from a hospital bed although this would be something that his primary care provider would likely have to order since anything that is order on our side has to be directly related to wound care and again the hospital bed is not necessarily a direct relation to although I think it does contribute to his overall wound status on his lower extremities. 11/07/18 on evaluation today patient appears to be doing better in regard to his bilateral lower extremity. He's been tolerating the dressing changes without complication. We did in the interim since I last saw him switch to just using extras orbiting new alginate and that seems to have done much better for him. I'm very pleased with the overall progress that is made. 11/14/18 on evaluation today patient appears to be redoing rather well in regard to his left lower extremity ulcers which are the only ones remaining at this point. Fortunately there's no signs of active infection at this time which is good news. Overall been very pleased with how things seem to be progressing currently. No fevers, chills, nausea, or vomiting noted at this time. 11/20/17 on evaluation today patient actually appears to be doing quite well in regard to his lower extremities on the left on the right he has several blisters that showed up although there's some question about whether or not he has had his broker compression wraps on like he was supposed to or not. Fortunately there's no signs of active infection at this time which is good news. Unfortunately though he is doing well from the standpoint of the left leg the right leg is not doing as well again this is when we did not wrap last week. 11/28/18 on evaluation today patient appears to be doing well in regard to his bilateral lower  extremities is left appears to be healed is right is not healed but is very close to being so. Overall very pleased with how things seem to be progressing. Patient is likewise happy that things are doing well. 12/09/18 on evaluation today patient actually appears to be completely healed which is excellent news. He actually seems to be doing well in regard to the swelling of the bilateral lower extremities which is also great news. Overall very pleased with how things seem to be progressing. Readmission: 03/18/2019 upon evaluation today patient presents for reevaluation concerning issues that he is having with his left lower extremity where he does have a wound noted upon inspection today. He also has a wound on the right second toe on the tip where it is apparently been rubbing in his shoe I did look at issues and you can actually see where this has been occurring as well. Fortunately there is no signs of infection with regard to the toe necessarily although it is very swollen compared to normal that does have me somewhat concerned about the possibility of further evaluation for infection/osteomyelitis. I would recommend an x-ray to start with. Otherwise he states that it is been 1-2 weeks that he has had the draining in regard to left lower extremity he does not know how this happened he has been wearing his  compression appropriately and I do see that as well today but nonetheless I do think that he needs to continue to be very cautious with regard to elevation as well. Adam Merritt, Adam Merritt ([id number]) 03/25/2019 on evaluation today patient appears to be doing much better in regard to his lower extremity at this time. That is on the left. He did culture positive for Pseudomonas but the good news is he seems to be doing much better the gentamicin we applied topically seems to have done a great job for him. There is no signs of active infection at this time systemically and locally he is doing much better.  No fevers, chills, nausea, vomiting, or diarrhea. In regard to his right toe this seems to be doing much better there is very little pressure noted at this point I did clean up some of the callus today around the edges of the wound as well as the surface of the wound but to be honest he is progressing quite nicely. 10/30; the area on the left anterior lower tibia area is healed over. His edema control is adequate even though his use of the compression pumps seems very intermittent. He has a juxta lite stocking on the right leg and a think there is 1 for the left leg at home. His wife is putting these on. He has an area on the plantar right second toe which is a hammertoe they are trying to offload this 04/08/2019 on evaluation today patient appears to be doing well in regard to his lower extremities which is good news. We are seeing him today for his toe ulcer which is giving him a little bit more trouble but again still seems to be doing better at this point which is good news. He is going require some sharp debridement in regard to the toe today however. 04/15/2019 on evaluation today patient actually appears to be doing quite well with regard to his toe ulcer. I am very pleased with how things are doing in that regard. With regard to his lower extremity edema in general he tells me he is not been using the lymphedema pumps regularly although he does not use them. I think that he needs to use them more regularly he does have a small blister on the left lower extremity this is not open at this point but obviously it something that can get worse if he does not keep the compression going and the pumps going as well. 04/22/2019 on evaluation today patient appears to be doing well with regard to his toe ulcer. He has been tolerating the dressing changes without complication. This is measuring slightly smaller this week as compared to last week. Fortunately there is no signs of active infection at this  time. 05/06/2019 on evaluation today patient actually appears to be doing excellent. In fact his toe appears to be completely healed which is great news. There is no signs of active infection at this time. Readmission: Patient presents today for follow-up after having been discharged at the beginning of December 2020. He states that in recent weeks things have reopened and has been having more trouble at this point. With that being said fortunately there is no signs of active infection at this time. No fever chills noted. Nonetheless he also does have an issue with one of his toes as well that seems to be somewhat this is on the right foot second toe. 07/25/2019 upon evaluation today patient's lower extremities appear to be doing excellent bilaterally. I really  feel like he is showing signs of great improvement and in fact I think he is close to healing which is great news. There is no signs of active infection at this time. No fevers, chills, nausea, vomiting, or diarrhea. 07/31/2019 upon evaluation today patient appears to be doing excellent and in fact I think may be completely healed in regard to his right lower extremity that were still to monitor this 1 more week before closing it out. The left lower extremity is measuring smaller although not completely closed seems to be doing excellent. 08/07/2019 upon evaluation today patient appears to be doing excellent in regard to his wounds. In fact the right lower extremity is completely healed there is no issues here. The left lower extremity has 1 very small area still remaining fortunately there is no signs of infection and overall I think he is very close to closure of the here as well. 08/14/19 upon evaluation today patient appears to be doing excellent in regard to his lower extremities. In fact he appears to be completely healed as of today. Fortunately there's no signs of active infection at this time. No fevers, chills, nausea, or vomiting noted  at this time. READMISSION 09/26/2019 This is a 75 year old man who is well-known to this clinic having just been discharged on 08/14/2019. He has known lymphedema and chronic venous insufficiency. He has Farrow wrap stockings and also external compression pumps. He does not use the external compression pumps however. He does however apparently fairly faithfully use the stockings. They have not been lotion in his legs. He has developed new wounds on the right anterior as well as the left medial left lateral and left posterior calf. He returns for our review of this Past medical history includes coronary artery disease, hypertension, congestive heart failure, COPD, venous insufficiency with lymphedema, type 2 diabetes. He apparently has compression pumps, Farrow wraps and at 1 point a hospital bed although I believe he sleeps in a recliner 10/03/2019 upon evaluation today patient appears to be doing better with regard to his wounds in general. He has been tolerating the dressing changes without complication. Fortunately there is no signs of active infection. No fevers, chills, nausea, vomiting, or diarrhea. I do believe the compression wraps are helping as they always have in the past. 10/10/2019 upon evaluation today patient appears to be doing excellent at this point. His right lower extremity is measuring much better and in fact is completely healed. His left lower extremity is also measuring better though not healed seems to be doing excellent at this point which is great news. Overall I am very pleased with how things appear currently. 10/27/2019 upon evaluation today patient appears to be doing quite well with regard to his wounds currently. He has been tolerating the dressing changes without complication. Fortunately there is no signs of active infection at this time. In fact he is almost completely healed on the left and has just a very small area open and on the right he is completely  healed. 11/04/2019 upon evaluation today patient appears to be doing quite well with regard to his wounds. In fact he appears to be quite possibly completely healed on the left lower extremity now as well the right lower extremity is still doing well. With that being said unfortunately though this may be healed I think it is a very fragile area and I cannot even confirm there is not a very small opening still remaining. I really think he may benefit from 1 additional  weeks wrapping before discontinuing. 11/10/2019 upon evaluation today patient appears to be doing well in regard to his original wounds in fact everything that we were treating last week is completely healed on the left. Unfortunately he has a new skin tear just above where the wrap slipped down due to a fall he sustained causing this injury. 11/17/2019 on evaluation today patient appears to be doing better with regard to his wound. He has been tolerating the dressing changes without ERICSON, NAFZIGER. ([id number]) complication. Fortunately there is no signs of active infection at this time. No fevers, chills, nausea, vomiting, or diarrhea. 11/24/2019 upon evaluation today patient appears to be doing well with regard to his wound. This is making good progress there does not appear to be signs of active infection but overall I do feel like he is headed in the correct direction. Overall he is tolerating the compression wrap as well. 12/02/2019 upon evaluation today patient appears to be doing well with regard to his leg ulcer. In fact this appears to be completely healed and this is the last of the wounds that he had but still the left anterior lower extremity. Fortunately there is no signs of active infection at this time. No fevers, chills, nausea, vomiting, or diarrhea. Readmission: 02/05/2020 on evaluation today patient appears to be doing well with regard to his wound all things considered. He actually tells me that a couple weeks ago he had  trouble with his wraps and therefore took him off for a couple of days in order to give his legs a break. It was during this time that he actually developed increased swelling and edema of his legs and blisters on both legs. That is when he called to make the appointment. Nonetheless since that time he began wearing his compression wraps which are Velcro in the meantime and has done extremely well with this. In fact his leg appears to be under good control as far as edema is concerned today it is nothing like what I am seeing when he has come in for evaluations in the past. They have been using some of the silver alginate at home which has been beneficial for him. Fortunately there is no signs of active infection at this time. No fevers, chills, nausea, vomiting, or diarrhea. 02/19/2020 on evaluation today patient unfortunately does not appear to be doing quite as well as I would like to see. He has no signs of active infection at this time but unfortunately he is not doing well in regard to the overall appearance of his left leg. He has a couple other areas that are weeping now and the leg is much more swollen. I think that he needs to actually have a compression wrap to try to manage this. 02/26/2020 on evaluation today patient appears to be doing well with regard to his left lower extremity ulcer. Fortunately the swelling is down I do believe the pressure wrap seems to be doing quite well. Fortunately there is no signs of active infection at this time. 03/05/2020 upon evaluation today patient appears to be doing excellent in regard to his legs at this point. Fortunately there is no signs of active infection which is great news. Overall he feels like he is completely healed which is great news. Readmission: 05/20/2020 upon evaluation today patient appears to be doing well with regard to his leg ulcer all things considered. He is actually coming back to see [place] after having had a reopening of the ulcers  on his  left leg in the past several weeks. He is out of dressing supplies and his wife is been try to take care of this as best she can. 06/10/2020 upon evaluation today patient unfortunately appears to be doing significantly worse today compared to when I last saw him. He and his wife both tell me that "there has been a lot going on". I did not actually go into detail about everything that has been happening but nonetheless he has not obviously been wearing his compression. He brings them with him today but they were not on. I am not even certain to be honest that he could get those on at this point. Nonetheless I do believe that he is going to require compression wrapping at some point shortly but right now we need to try to get what appears to be infection under control at this time. 06/28/2020 upon evaluation today patient appears to be doing well with regard to 06/28/2020 upon evaluation today patient appears to be doing well pretty well in regard to his left leg which is much better than previous. With that being said in regard to his right leg he does seem to be having some issues with a new wound after he sustained a fall. This fortunately is not too deep but does appear to be something we need to address. Neither deep which is good. 07/05/2020 on evaluation today patient appears to be doing well with regard to his wounds. In fact everything on the left leg appears to be pretty much healed on the right leg this is very close though not completely closed yet. Fortunately there is no evidence of active infection at this time. No fevers, chills, nausea, vomiting, or diarrhea. 07/13/2019 upon evaluation today patient appears to be making good progress which is great news. Overall I am extremely pleased with where things stand today. There does not appear to be any signs of active infection which is great news and overall his left leg appears healed still the right leg though not healed does seem to be  making good progress which is excellent news. He is having no pain. 07/19/2020 on evaluation today patient appears to be doing well with regard to his wound. He has been tolerating the dressing changes without complication. Fortunately there does not appear to be any signs of active infection at this time. No fevers, chills, nausea, vomiting, or diarrhea. Readmission: 08/20/2020 on evaluation today patient presents for reevaluation here in the clinic concerning issues that he has been having with his bilateral lower extremities. Unfortunately this is an ongoing reoccurring issue. He tells me that he is really not certain exactly what caused the issue although as I discussed this further with him it appears that he has been sitting for the most part and sleeping in his chair with his feet on the ground he is not really elevating at all in fact the chair does not even have a reclining function therefore it is basically just him sitting with his feet on the ground. I think this alone has probably led to his increased swelling he is also having more difficulty walking which means that he is pumping less blood flow through his legs anyway as far as the venous stasis is concerned. All in all I think this is leading to a downward spiral of him not being able to walk very well as his legs are so heavy, they begin to weep, and overall he just does not seem to be doing as well as  it was when I last saw him. 08/27/2020 upon evaluation today patient's legs actually are showing signs of improvement which is great news. There does not appear to be any evidence of infection which is also excellent news. I am extremely pleased with where things stand today. No fevers, chills, nausea, vomiting, or diarrhea. 09/03/20 upon evaluation today patient appears to be doing excellent in regard to his wounds currently. He has been tolerating the dressing changes without complication and overall I am extremely pleased with where  things stand today. No fevers, chills, nausea, vomiting, or diarrhea. Overall he is healed on the right leg although the left leg not being completely healed still is doing significantly better. 09/10/2020/8/22 upon evaluation today patient appears to be doing well with regard to his wounds. He has been tolerating the dressing changes without complication. Fortunately he appears to be completely healed. I am very pleased in that regard. As is the patient his right leg is also doing well he did get a new recliner which is helping him keep his feet off the ground which I think is helped he is down 2 cm on the left compared to last week so I think there is some definite improvement here. Adam Merritt, Adam Merritt ([id number]) Readmission 12/15/20: Mr. Jethro Radke is a 75 year old male with a past medical history of lymphedema, insulin-dependent type 2 diabetes, COPD and congestive heart failure that presents to the clinic for increased drainage from his legs bilaterally, Left greater than right. Patient has lymphedema. He has been seen in our clinic on multiple occasions for this issue. He currently denies signs of infection. He has not been using his lymphedema pumps. 12/23/2020 upon evaluation today patient appears to be doing well with regard to his wounds. He has been tolerating the dressing changes he really does not have any open wounds just weeping areas of the legs bilaterally and fortunately seems to be doing better at this point. 12/28/2020 upon evaluation today patient's wound is actually showing signs of good improvement. There does not appear to be any evidence of infection which is great news and overall I am extremely pleased at this point. I think is very close to complete resolution. 01/04/2021 upon evaluation today patient actually appears to be completely healed which is great news. I do not see any signs of active infection currently which is also great news. In general I am extremely pleased with  where we stand and I do believe that the patient is tolerating the dressing changes without complication. I believe he is ready to go back into his Velcro compression and I did see that they brought them with him today. Readmission: Patient presents today for follow-up he was last seen August 2. He is having some issues with some mild drainage and leaking from the left leg. This is something that has reopened and again is a recurrent issue for him. Subsequently he does wear his Velcro compression wraps though I think we may want to look into something a little bit better for him I am thinking that we may want to try the juxta fit compression which I think would do much more effective compression treatment overall for him. 04/08/2021 upon evaluation today patient appears to be doing well with regard to his legs. I do feel like he is showing signs of improvement which is great news. No fevers, chills, nausea, vomiting, or diarrhea. 04/15/2021 upon evaluation today patient's wounds actually appear to be very close to complete resolution. He is legs are  actually doing well left leg just has a small area of leaking at this point. Overall I think that we are very close to complete resolution based on what I am seeing. Nonetheless he seems to not be feeling too well today I really do not know what exactly is going on here. 04/22/2021 upon evaluation patient appears to be completely healed based on what I see today. I do not see anything open both legs appear to be doing quite well. Electronic Signature(s) Signed: 04/22/2021 5:01:32 PM By: Worthy Keeler PA-C Entered By: Worthy Keeler on 04/22/2021 17:01:32 Adam Merritt ([id number]) -------------------------------------------------------------------------------- Physical Exam Details Patient Name: Adam Merritt Date of Service: 04/22/2021 1:15 PM Medical Record Number: [id number] Patient Account Number: [id number] Date of Birth/Sex: 1946/01/09  (75 y.o. M) Treating RN: Carlene Coria Primary Care Provider: [name] Other Clinician: Referring Provider: [name] Treating Provider/Extender: Jeri Cos [name]: 4 Constitutional Obese and well-hydrated in no acute distress. Respiratory normal breathing without difficulty. Psychiatric this patient is able to make decisions and demonstrates good insight into disease process. Alert and Oriented x 3. pleasant and cooperative. Notes Patient showed complete epithelization and very pleased in this regard and I do not see any signs of infection which is great news in general I think that he is doing awesome today. Electronic Signature(s) Signed: 04/22/2021 5:01:57 PM By: Worthy Keeler PA-C Entered By: Worthy Keeler on 04/22/2021 17:01:57 Adam Merritt ([id number]) -------------------------------------------------------------------------------- Physician Orders Details Patient Name: Adam Merritt Date of Service: 04/22/2021 1:15 PM Medical Record Number: [id number] Patient Account Number: [id number] Date of Birth/Sex: 07/13/1945 (75 y.o. M) Treating RN: Carlene Coria Primary Care Provider: [name] Other Clinician: Referring Provider: [name] Treating Provider/Extender: Skipper Cliche in Treatment: 4 Verbal / Phone Orders: No Diagnosis Coding ICD-10 Coding Code Description I89.0 Lymphedema, not elsewhere classified I87.2 Venous insufficiency (chronic) (peripheral) L97.812 Non-pressure chronic ulcer of other part of right lower leg with fat layer exposed L97.822 Non-pressure chronic ulcer of other part of left lower leg with fat layer exposed Discharge From [facility] o Discharge from [facility] Complete o Wear compression garments daily. Put garments on first thing when you wake up and remove them before bed. o Moisturize legs daily after removing compression garments. o Elevate, Exercise Daily and Avoid Standing for Long  Periods of Time. Wound Treatment Wound #41 - Lower Leg Wound Laterality: Left, Circumferential Compression Stockings: Jobst Farrow Wrap Basic (DME) Left Leg Compression Amount: 30-40 mmHG Right Leg Compression Amount: 30-40 mmHG Electronic Signature(s) Signed: 04/22/2021 2:20:47 PM By: Carlene Coria RN Signed: 04/22/2021 5:42:25 PM By: Worthy Keeler PA-C Entered By: Carlene Coria on 04/22/2021 14:20:46 Adam Merritt ([id number]) -------------------------------------------------------------------------------- Problem List Details Patient Name: Adam Merritt Date of Service: 04/22/2021 1:15 PM Medical Record Number: [id number] Patient Account Number: [id number] Date of Birth/Sex: 04/09/1946 (75 y.o. M) Treating RN: Carlene Coria Primary Care Provider: [name] Other Clinician: Referring Provider: [name] Treating Provider/Extender: Skipper Cliche in Treatment: 4 Active Problems ICD-10 Encounter Code Description Active Date MDM Diagnosis I89.0 Lymphedema, not elsewhere classified 03/25/2021 No Yes I87.2 Venous insufficiency (chronic) (peripheral) 03/25/2021 No Yes L97.812 Non-pressure chronic ulcer of other part of right lower leg with fat layer 03/25/2021 No Yes exposed L97.822 Non-pressure chronic ulcer of other part of left lower leg with fat layer 03/25/2021 No Yes exposed Inactive Problems Resolved Problems Electronic Signature(s) Signed: 04/22/2021 1:28:50 PM By: Worthy Keeler PA-C Entered By:  Worthy Keeler on 04/22/2021 13:28:50 Adam Merritt, Adam Merritt ([id number]) -------------------------------------------------------------------------------- Progress Note Details Patient Name: Adam Merritt, Adam Merritt Date of Service: 04/22/2021 1:15 PM Medical Record Number: [id number] Patient Account Number: [id number] Date of Birth/Sex: 1945-07-18 (75 y.o. M) Treating RN: Carlene Coria Primary Care Provider: [name] Other Clinician: Referring Provider: [name] Treating  Provider/Extender: Skipper Cliche in Treatment: 4 Subjective Chief Complaint Information obtained from Patient Bilateral LE edema and weeping ulcers History of Present Illness (HPI) 10/18/17-He is here for initial evaluation of bilateral lower extremity ulcerations in the presence of venous insufficiency and lymphedema. He has been seen by vascular medicine in the past, Dr. Lucky Cowboy, last seen in 2016. He does have a history of abnormal ABIs, which is to be expected given his lymphedema and venous insufficiency. According to Epic, it appears that all attempts for arterial evaluation and/or angiography were not follow through with by patient. He does have a history of being seen in lymphedema clinic in 2018, stopped going approximately 6 months ago stating "it didn't do any good". He does not have lymphedema pumps, he does not have custom fit compression wrap/stockings. He is diabetic and his recent A1c last month was 7.6. He admits to chronic bilateral lower extremity pain, no change in pain since blister and ulceration development. He is currently being treated with Levaquin for bronchitis. He has home health and we will continue. 10/25/17-He is here in follow-up evaluation for bilateral lower extremity ulcerationssubtle he remains on Levaquin for bronchitis. Right lower extremity with no evidence of drainage or ulceration, persistent left lower extremity ulceration. He states that home health has not been out since his appointment. He went to [facility] on Tuesday, studies revealed: RIGHT ABI 0.9, TBI 0.6 LEFT ABI 1.1, TBI 0.6 with triphasic flow bilaterally. We will continue his same treatment plan. He has been educated on compression therapy and need for elevation. He will benefit from lymphedema pumps 11/01/17-He is here in follow-up evaluation for left lower extremity ulcer. The right lower extremity remains healed. He has home health services, but they have not been out to see  the patient for 2-3 weeks. He states it home health physical therapy changed his dressing yesterday after therapy; he placed Ace wrap compression. We are still waiting for lymphedema pumps, reordered d/t need for company change. 11/08/17-He is here in follow-up evaluation for left lower extremity ulcer. It is improved. Edema is significantly improved with compression therapy. We will continue with same treatment plan and he will follow-up next week. No word regarding lymphedema pumps 11/15/17-He is here in follow-up evaluation for left lower extremity ulcer. He is healed and will be discharged from wound care services. I have reached out to [facility] regarding his lymphedema pumps. They have been unable to reach the patient; the contact number they had with the patient's wife's cell phone and she has not answered any unrecognized calls. Contact should be made today, trial planned for next week; [facility] will continue to follow 11/27/17 on evaluation today patient has multiple blistered areas over the right lower extremity his left lower extremity appears to be doing okay. These blistered areas show signs of no infection which is great news. With that being said he did have some necrotic skin overlying which was mechanically debrided away with saline and gauze today without complication. Overall post debridement the wounds appear to be doing better but in general his swelling seems to be increased. This is obviously not good news. I think  this is what has given rise to the blisters. 12/04/17 on evaluation today patient presents for follow-up concerning his bilateral lower extremity edema in the right lower extremity ulcers. He has been tolerating the dressing changes without complication. With that being said he has had no real issues with the wraps which is also good news. Overall I'm pleased with the progress he's been making. 12/11/17 on evaluation today patient appears to be doing rather  well in regard to his right lateral lower extremity ulcer. He's been tolerating the dressing changes without complication. Fortunately there does not appear to be any evidence of infection at this time. Overall I'm pleased with the progress that is being made. Unfortunately he has been in the hospital due to having what sounds to be a stomach virus/flu fortunately that is starting to get better. 12/18/17 on evaluation today patient actually appears to be doing very well in regard to his bilateral lower extremities the swelling is under fairly good control his lymphedema pumps are still not up and running quite yet. With that being said he does have several areas of opening noted as far as wounds are concerned mainly over the left lower extremity. With that being said I do believe once he gets lymphedema pumps this would least help you mention some the fluid and preventing this from occurring. Hopefully that will be set up soon sleeves are Artie in place at his home he just waiting for the machine. 12/25/17 on evaluation today patient actually appears to be doing excellent in fact all of his ulcers appear to have resolved his legs appear very well. I do think he needs compression stockings we have discussed this and they are actually going to go to [place] today to elastic therapy to get this fitted for him. I think that is definitely a good thing to do. Readmission: 04/09/18 upon evaluation today this patient is seen for readmission due to bilateral lower extremity lymphedema. He has significant swelling of his extremities especially on the left although the right is also swollen he has weeping from both sides. There are no obvious open wounds at this point. Fortunately he has been doing fairly well for quite a bit of time since I last saw him. Nonetheless unfortunately this seems to have reopened and is giving quite a bit of trouble. He states this began about a week ago when he first called [place] to get  in to be seen. No fevers, chills, nausea, or vomiting noted at this time. He has not been using his lymphedema pumps due to the fact that they won't fit on his leg at this point likewise is also not been using his compression for essentially the same reason. 04/16/18 upon evaluation today patient actually appears to be doing a little better in regard to the fluid in his bilateral lower extremities. With that being said he's had three falls since I saw him last week. He also states that he's been feeling very poorly. I was concerned last week and feel like that the concern is still there as far as the congestion in his chest is concerned he seems to be breathing about the same as last week but again he states he's very weak he's not even able to walk further than from the chair to the door. His wife had to buy a wheelchair just to be able to get them out of the house to get to the appointment today. This has me very concerned. Adam Merritt, Adam Merritt ([id number]) 04/23/18 on  evaluation today patient actually appears to be doing much better than last week's evaluation. At that time actually had to transport him to the ER via EMS and he subsequently was admitted for acute pulmonary edema, acute renal failure, and acute congestive heart failure. Fortunately he is doing much better. Apparently they did dialyze him and were able to take off roughly 35 pounds of fluid. Nonetheless he is feeling much better both in regard to his breathing and he's able to get around much better at this time compared to previous. Overall I'm very happy with how things are at this time. There does not appear to be any evidence of infection currently. No fevers, chills, nausea, or vomiting noted at this time. 04/30/2018 patient seen today for follow-up and management of bilateral lower extremity lymphedema. He did express being more sad today than usual due to the recent loss of his dog. He states that he has been compliant with using  the lymphedema pumps. However he does admit a minute over the last 2-3 days he has not been using the pumps due to the recent loss of his dog. At this time there is no drainage or open wounds to his lower extremities. The left leg edema is measuring smaller today. Still has a significant amount of edema on bilateral lower extremities With dry flaky skin. He will be referred to the lymphedema clinic for further management. Will continue 3 layer compression wraps and follow-up in 1-2 weeks.Denies any pain, fever, chairs recently. No recent falls or injuries reported during this visit. 05/07/18 on evaluation today patient actually appears to be doing very well in regard to his lower extremities in general all things considered. With that being said he is having some pain in the legs just due to the amount of swelling. He does have an area where he had a blister on the left lateral lower extremity this is open at this point other than that there's nothing else weeping at this time. 05/14/18 on evaluation today patient actually appears to be doing excellent all things considered in regard to his lower extremities. He still has a couple areas of weeping on each leg which has continued to be the issue for him. He does have an appointment with the lymphedema clinic although this isn't until February 2020. That was the earliest they had. In the meantime he has continued to tolerate the compression wraps without complication. 05/28/18 on evaluation today patient actually appears to be doing more poorly in regard to his left lower extremity where he has a wound open at this point. He also had a fall where he subsequently injured his right great toe which has led to an open wound at the site unfortunately. He has been tolerating the dressing changes without complication in general as far as the wraps are concerned that he has not been putting any dressing on the left 1st toe ulcer site. 06/11/18 on evaluation today  patient appears to be doing much worse in regard to his bilateral lower extremity ulcers. He has been tolerating the dressing changes without complication although his legs have not been wrapped more recently. Overall I am not very pleased with the way his legs appear. I do believe he needs to be back in compression wraps he still has not received his compression wraps from the [facility] hospital as of yet. 06/18/18 on evaluation today patient actually appears to be doing significantly better than last time I saw him. He has been tolerating the compression wraps  without complication in the circumferential ulcers especially appear to be doing much better. His toe ulcer on the right in regard to the great toe is better although not as good as the legs in my pinion. No fevers chills noted 07/02/18 on evaluation today patient appears to be doing much better in regard to his lower extremity ulcers. Unfortunately since I last saw him he's had the distal portion of his right great toe if he dated it sounds as if this actually went downhill very quickly. I had only seen him a few days prior and the toe did not appear to be infected at that point subsequently became infected very rapidly and it was decided by the surgeon that the distal portion of the toe needed to be removed. The patient seems to be doing well in this regard he tells me. With that being said his lower extremities are doing better from the standpoint of the wounds although he is significantly swollen at this point. 07/09/18 on evaluation today patient appears to be doing better in regard to the wounds on his lower extremities. In fact everything is almost completely healed he is just a small area on the left posterior lower extremity that is open at this point. 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 With that being said he needs some kind of compression it does sound like he may not be wearing his compression, that is the wraps, during the entire time between when he's here visit to visit. Apparently his wife took the current one off because it began to "fall apart". 07/16/18 on evaluation today patient appears to be extremely swollen especially in regard to his left lower extremity unfortunately. He also has a new skin tear over the left lower extremity and there's a smaller area on the right lower extremity as well. Unfortunately this seems to be due in part to blistering and fluid buildup in his leg. He did get the reduction wraps that were ordered by the [facility] for him to go to lymphedema clinic. With that being said his wounds on the legs have not healed to the point to where they would likely accept them as a patient lymphedema clinic currently. We need to try to get this to heal. With that being said he's been taking his wraps off which is not doing him any favors at this point. In fact this is probably quite counterproductive compared to what needs to occur. We will likely need to increase to a four layer compression wrap and continue to also utilize elevation and he has to keep the wraps on not take them off as he's been doing currently hasn't had a wrap on since Saturday. 07/23/18 on evaluation today patient appears to be doing much better in regard to his bilateral lower extremities. In fact his left lower extremity which was the largest is actually 15 cm smaller today compared to what it was last time he was here in our clinic. This is obviously good news after just one week. Nonetheless the differences he actually kept the wraps on during the entire week this time. That's not typical for him. I do believe he understands a little bit better now the severity of the situation and why it's important for him to keep these wraps on. 07/30/18 on  evaluation today patient  actually appears to be doing rather well in regard to his lower extremities. His legs are much smaller than they have been in the past and he actually has only one very small rudder superficial region remaining that is not closed on the left lateral/posterior lower extremity even this is almost completely close. I do believe likely next week he will be healed without any complications. I do think we need to continue the wraps however this seems to be beneficial for him. I also think it may be a good time for [place] to go ahead and see about getting the appointment with the lymphedema clinic which is supposed to be made for him in order to keep this moving along and hopefully get them into compression wraps that will in the end help him to remain healed. 08/06/18 on evaluation today patient actually appears to be doing very well in regard as bilateral lower extremities. In fact his wounds appear to be completely healed at this time. 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 He is in the process of getting appointment with the lymphedema clinic we have made this referral were just waiting to hear back on the schedule time. We need to follow up on that today as well. 08/13/18 on evaluation today patient actually appears to be doing very well in regard to his bilateral lower extremities there are no open wounds at this point. We are gonna go ahead and see about ordering the Velcro compression wraps for him had a discussion with them about [place] doing it versus the [place] they feel like they can definitely afford going ahead and get the wraps themselves and they would prefer to try to avoid having to go to the lymphedema clinic if it all possible which I completely understand. As long as he has good compression I'm okay either way. Adam Merritt, Adam Merritt ([id number]) 3/17//20 on evaluation today patient actually appears to be doing well in  regard to his bilateral lower extremity ulcers. He has been tolerating the dressing changes without complication specifically the compression wraps. Overall is had no issues my fingers finding that I see at this point is that he is having trouble with constipation. He tells me he has not been able to go to the bathroom for about six days. He's taken to over-the- counter oral laxatives unfortunately this is not helping. He has not contacted his doctor. 08/27/18 on evaluation today patient appears to be doing fairly well in regard to his lower extremities at this point. There does not appear to be any new altars is swelling is very well controlled. We are still waiting for his Velcro compression wraps to arrive that should be sometime in the next week is Artie sent the check for these. 09/03/18 on evaluation today patient appears to be doing excellent in regard to his bilateral lower extremities which he shows no signs of wound openings in regard to this point. He does have his Velcro compression wraps which did arrive in the mail since I saw him last week. Overall he is doing excellent in my pinion. 09/13/18 on evaluation today patient appears to be doing very well currently in regard to the overall appearance of his bilateral lower extremities although he's a little bit more swollen than last time we saw him. At that point he been discharged without any open wounds. Nonetheless he has a small open wound on the posterior left lower extremity with some evidence of cellulitis noted  as well. Fortunately I feel like he has made good progress overall with regard to his lower extremities from were things used to be. 09/20/18 on evaluation today patient actually appears to be doing much better. The erythematous lower extremity is improving wound itself which is still open appears to be doing much better as far as but appearance as well as pain is concerned overall very pleased in this regard. There's no signs of  active infection at this time. 09/27/18 on evaluation today patient's wounds on the lower extremity actually appear to be doing fairly well at this time which is good news. There is no evidence of active infection currently and again is just as left lower extremity were there any wounds at this point anyway. I believe they may be completely healed but again I'm not 100% sure based on evaluation today. I think one more week of observation would likely be a good idea. 10/04/18 on evaluation today patient actually appears to be doing excellent in regard to the left lower extremity which actually appears to be completely healed as of today. Unfortunately he's been having issues with his right lower extremity have a new wound that has opened. Fortunately there's no evidence of active infection at this time which is good news. 10/10/18 upon evaluation today patient actually appears to be doing a little bit worse with the new open area on his right posterior lower extremity. He's been tolerating the dressing changes without complication. Right now we been using his compression wraps although I think we may need to switch back to actually performing bilateral compression wraps are in the clinic. No fevers, chills, nausea, or vomiting noted at this time. 10/17/18 on evaluation today patient actually appears to be doing quite well in regard to his bilateral lower extremity ulcers. He has been tolerating the reps without complication although he would prefer not to rewrap his legs as of today. Fortunately there's no signs of active infection which is good news. No fevers, chills, nausea, or vomiting noted at this time. 10/24/18 on evaluation today patient appears to be doing very well in regard to his lower extremities. His right lower extremity is shown signs of healing and his left lower Trinity though not healed appears to be improving which is excellent news. Overall very pleased with how things seem to be  progressing at this time. The patient is likewise happy to hear this. His Velcro compression wraps however have not been put on properly will gonna show his wife how to do that properly today. 10/31/18 on evaluation today patient appears to be doing more poorly in regard to his left lower extremity in particular although both lower extremities actually are showing some signs of being worse than my previous evaluation. Unfortunately I'm just not sure that his compression stockings even with the use of the compression/lymphedema pumps seem to be controlling this well. Upon further questioning he tells me that he also is not able to lie flat in the bed due to his congestive heart failure and difficulty breathing. For that reason he sleeps in his recliner. To make matters worse his recliner also cannot even hold his legs up so instead of even being somewhat elevated they pretty much hang down to the floor. This is the way he sleeps each night which is definitely counterproductive to everything else that were attempting to do from the standpoint of controlling his fluid. Nonetheless I think that he potentially could benefit from a hospital bed although this would be  something that his primary care provider would likely have to order since anything that is order on our side has to be directly related to wound care and again the hospital bed is not necessarily a direct relation to although I think it does contribute to his overall wound status on his lower extremities. 11/07/18 on evaluation today patient appears to be doing better in regard to his bilateral lower extremity. He's been tolerating the dressing changes without complication. We did in the interim since I last saw him switch to just using extras orbiting new alginate and that seems to have done much better for him. I'm very pleased with the overall progress that is made. 11/14/18 on evaluation today patient appears to be redoing rather well in regard  to his left lower extremity ulcers which are the only ones remaining at this point. Fortunately there's no signs of active infection at this time which is good news. Overall been very pleased with how things seem to be progressing currently. No fevers, chills, nausea, or vomiting noted at this time. 11/20/17 on evaluation today patient actually appears to be doing quite well in regard to his lower extremities on the left on the right he has several blisters that showed up although there's some question about whether or not he has had his broker compression wraps on like he was supposed to or not. Fortunately there's no signs of active infection at this time which is good news. Unfortunately though he is doing well from the standpoint of the left leg the right leg is not doing as well again this is when we did not wrap last week. 11/28/18 on evaluation today patient appears to be doing well in regard to his bilateral lower extremities is left appears to be healed is right is not healed but is very close to being so. Overall very pleased with how things seem to be progressing. Patient is likewise happy that things are doing well. 12/09/18 on evaluation today patient actually appears to be completely healed which is excellent news. He actually seems to be doing well in regard to the swelling of the bilateral lower extremities which is also great news. Overall very pleased with how things seem to be progressing. Readmission: 03/18/2019 upon evaluation today patient presents for reevaluation concerning issues that he is having with his left lower extremity where he does have a wound noted upon inspection today. He also has a wound on the right second toe on the tip where it is apparently been rubbing in his shoe I did look at issues and you can actually see where this has been occurring as well. Fortunately there is no signs of infection with regard to Adam Merritt, Adam Merritt. ([id number]) the toe necessarily although  it is very swollen compared to normal that does have me somewhat concerned about the possibility of further evaluation for infection/osteomyelitis. I would recommend an x-ray to start with. Otherwise he states that it is been 1-2 weeks that he has had the draining in regard to left lower extremity he does not know how this happened he has been wearing his compression appropriately and I do see that as well today but nonetheless I do think that he needs to continue to be very cautious with regard to elevation as well. 03/25/2019 on evaluation today patient appears to be doing much better in regard to his lower extremity at this time. That is on the left. He did culture positive for Pseudomonas but the good news is he seems  to be doing much better the gentamicin we applied topically seems to have done a great job for him. There is no signs of active infection at this time systemically and locally he is doing much better. No fevers, chills, nausea, vomiting, or diarrhea. In regard to his right toe this seems to be doing much better there is very little pressure noted at this point I did clean up some of the callus today around the edges of the wound as well as the surface of the wound but to be honest he is progressing quite nicely. 10/30; the area on the left anterior lower tibia area is healed over. His edema control is adequate even though his use of the compression pumps seems very intermittent. He has a juxta lite stocking on the right leg and a think there is 1 for the left leg at home. His wife is putting these on. He has an area on the plantar right second toe which is a hammertoe they are trying to offload this 04/08/2019 on evaluation today patient appears to be doing well in regard to his lower extremities which is good news. We are seeing him today for his toe ulcer which is giving him a little bit more trouble but again still seems to be doing better at this point which is good news. He is  going require some sharp debridement in regard to the toe today however. 04/15/2019 on evaluation today patient actually appears to be doing quite well with regard to his toe ulcer. I am very pleased with how things are doing in that regard. With regard to his lower extremity edema in general he tells me he is not been using the lymphedema pumps regularly although he does not use them. I think that he needs to use them more regularly he does have a small blister on the left lower extremity this is not open at this point but obviously it something that can get worse if he does not keep the compression going and the pumps going as well. 04/22/2019 on evaluation today patient appears to be doing well with regard to his toe ulcer. He has been tolerating the dressing changes without complication. This is measuring slightly smaller this week as compared to last week. Fortunately there is no signs of active infection at this time. 05/06/2019 on evaluation today patient actually appears to be doing excellent. In fact his toe appears to be completely healed which is great news. There is no signs of active infection at this time. Readmission: Patient presents today for follow-up after having been discharged at the beginning of December 2020. He states that in recent weeks things have reopened and has been having more trouble at this point. With that being said fortunately there is no signs of active infection at this time. No fever chills noted. Nonetheless he also does have an issue with one of his toes as well that seems to be somewhat this is on the right foot second toe. 07/25/2019 upon evaluation today patient's lower extremities appear to be doing excellent bilaterally. I really feel like he is showing signs of great improvement and in fact I think he is close to healing which is great news. There is no signs of active infection at this time. No fevers, chills, nausea, vomiting, or diarrhea. 07/31/2019  upon evaluation today patient appears to be doing excellent and in fact I think may be completely healed in regard to his right lower extremity that were still to monitor this  1 more week before closing it out. The left lower extremity is measuring smaller although not completely closed seems to be doing excellent. 08/07/2019 upon evaluation today patient appears to be doing excellent in regard to his wounds. In fact the right lower extremity is completely healed there is no issues here. The left lower extremity has 1 very small area still remaining fortunately there is no signs of infection and overall I think he is very close to closure of the here as well. 08/14/19 upon evaluation today patient appears to be doing excellent in regard to his lower extremities. In fact he appears to be completely healed as of today. Fortunately there's no signs of active infection at this time. No fevers, chills, nausea, or vomiting noted at this time. READMISSION 09/26/2019 This is a 75 year old man who is well-known to this clinic having just been discharged on 08/14/2019. He has known lymphedema and chronic venous insufficiency. He has Farrow wrap stockings and also external compression pumps. He does not use the external compression pumps however. He does however apparently fairly faithfully use the stockings. They have not been lotion in his legs. He has developed new wounds on the right anterior as well as the left medial left lateral and left posterior calf. He returns for our review of this Past medical history includes coronary artery disease, hypertension, congestive heart failure, COPD, venous insufficiency with lymphedema, type 2 diabetes. He apparently has compression pumps, Farrow wraps and at 1 point a hospital bed although I believe he sleeps in a recliner 10/03/2019 upon evaluation today patient appears to be doing better with regard to his wounds in general. He has been tolerating the dressing changes  without complication. Fortunately there is no signs of active infection. No fevers, chills, nausea, vomiting, or diarrhea. I do believe the compression wraps are helping as they always have in the past. 10/10/2019 upon evaluation today patient appears to be doing excellent at this point. His right lower extremity is measuring much better and in fact is completely healed. His left lower extremity is also measuring better though not healed seems to be doing excellent at this point which is great news. Overall I am very pleased with how things appear currently. 10/27/2019 upon evaluation today patient appears to be doing quite well with regard to his wounds currently. He has been tolerating the dressing changes without complication. Fortunately there is no signs of active infection at this time. In fact he is almost completely healed on the left and has just a very small area open and on the right he is completely healed. 11/04/2019 upon evaluation today patient appears to be doing quite well with regard to his wounds. In fact he appears to be quite possibly completely healed on the left lower extremity now as well the right lower extremity is still doing well. With that being said unfortunately though this may be healed I think it is a very fragile area and I cannot even confirm there is not a very small opening still remaining. I really think he may benefit from 1 additional weeks wrapping before discontinuing. Adam Merritt, Adam Merritt ([id number]) 11/10/2019 upon evaluation today patient appears to be doing well in regard to his original wounds in fact everything that we were treating last week is completely healed on the left. Unfortunately he has a new skin tear just above where the wrap slipped down due to a fall he sustained causing this injury. 11/17/2019 on evaluation today patient appears to be doing better with  regard to his wound. He has been tolerating the dressing changes without complication. Fortunately  there is no signs of active infection at this time. No fevers, chills, nausea, vomiting, or diarrhea. 11/24/2019 upon evaluation today patient appears to be doing well with regard to his wound. This is making good progress there does not appear to be signs of active infection but overall I do feel like he is headed in the correct direction. Overall he is tolerating the compression wrap as well. 12/02/2019 upon evaluation today patient appears to be doing well with regard to his leg ulcer. In fact this appears to be completely healed and this is the last of the wounds that he had but still the left anterior lower extremity. Fortunately there is no signs of active infection at this time. No fevers, chills, nausea, vomiting, or diarrhea. Readmission: 02/05/2020 on evaluation today patient appears to be doing well with regard to his wound all things considered. He actually tells me that a couple weeks ago he had trouble with his wraps and therefore took him off for a couple of days in order to give his legs a break. It was during this time that he actually developed increased swelling and edema of his legs and blisters on both legs. That is when he called to make the appointment. Nonetheless since that time he began wearing his compression wraps which are Velcro in the meantime and has done extremely well with this. In fact his leg appears to be under good control as far as edema is concerned today it is nothing like what I am seeing when he has come in for evaluations in the past. They have been using some of the silver alginate at home which has been beneficial for him. Fortunately there is no signs of active infection at this time. No fevers, chills, nausea, vomiting, or diarrhea. 02/19/2020 on evaluation today patient unfortunately does not appear to be doing quite as well as I would like to see. He has no signs of active infection at this time but unfortunately he is not doing well in regard to the  overall appearance of his left leg. He has a couple other areas that are weeping now and the leg is much more swollen. I think that he needs to actually have a compression wrap to try to manage this. 02/26/2020 on evaluation today patient appears to be doing well with regard to his left lower extremity ulcer. Fortunately the swelling is down I do believe the pressure wrap seems to be doing quite well. Fortunately there is no signs of active infection at this time. 03/05/2020 upon evaluation today patient appears to be doing excellent in regard to his legs at this point. Fortunately there is no signs of active infection which is great news. Overall he feels like he is completely healed which is great news. Readmission: 05/20/2020 upon evaluation today patient appears to be doing well with regard to his leg ulcer all things considered. He is actually coming back to see [place] after having had a reopening of the ulcers on his left leg in the past several weeks. He is out of dressing supplies and his wife is been try to take care of this as best she can. 06/10/2020 upon evaluation today patient unfortunately appears to be doing significantly worse today compared to when I last saw him. He and his wife both tell me that "there has been a lot going on". I did not actually go into detail about everything that  has been happening but nonetheless he has not obviously been wearing his compression. He brings them with him today but they were not on. I am not even certain to be honest that he could get those on at this point. Nonetheless I do believe that he is going to require compression wrapping at some point shortly but right now we need to try to get what appears to be infection under control at this time. 06/28/2020 upon evaluation today patient appears to be doing well with regard to 06/28/2020 upon evaluation today patient appears to be doing well pretty well in regard to his left leg which is much better than  previous. With that being said in regard to his right leg he does seem to be having some issues with a new wound after he sustained a fall. This fortunately is not too deep but does appear to be something we need to address. Neither deep which is good. 07/05/2020 on evaluation today patient appears to be doing well with regard to his wounds. In fact everything on the left leg appears to be pretty much healed on the right leg this is very close though not completely closed yet. Fortunately there is no evidence of active infection at this time. No fevers, chills, nausea, vomiting, or diarrhea. 07/13/2019 upon evaluation today patient appears to be making good progress which is great news. Overall I am extremely pleased with where things stand today. There does not appear to be any signs of active infection which is great news and overall his left leg appears healed still the right leg though not healed does seem to be making good progress which is excellent news. He is having no pain. 07/19/2020 on evaluation today patient appears to be doing well with regard to his wound. He has been tolerating the dressing changes without complication. Fortunately there does not appear to be any signs of active infection at this time. No fevers, chills, nausea, vomiting, or diarrhea. Readmission: 08/20/2020 on evaluation today patient presents for reevaluation here in the clinic concerning issues that he has been having with his bilateral lower extremities. Unfortunately this is an ongoing reoccurring issue. He tells me that he is really not certain exactly what caused the issue although as I discussed this further with him it appears that he has been sitting for the most part and sleeping in his chair with his feet on the ground he is not really elevating at all in fact the chair does not even have a reclining function therefore it is basically just him sitting with his feet on the ground. I think this alone has probably  led to his increased swelling he is also having more difficulty walking which means that he is pumping less blood flow through his legs anyway as far as the venous stasis is concerned. All in all I think this is leading to a downward spiral of him not being able to walk very well as his legs are so heavy, they begin to weep, and overall he just does not seem to be doing as well as it was when I last saw him. 08/27/2020 upon evaluation today patient's legs actually are showing signs of improvement which is great news. There does not appear to be any evidence of infection which is also excellent news. I am extremely pleased with where things stand today. No fevers, chills, nausea, vomiting, or diarrhea. 09/03/20 upon evaluation today patient appears to be doing excellent in regard to his wounds currently. He has  been tolerating the dressing changes without complication and overall I am extremely pleased with where things stand today. No fevers, chills, nausea, vomiting, or diarrhea. Overall he is healed on the right leg although the left leg not being completely healed still is doing significantly better. Adam Merritt, Adam Merritt ([id number]) 09/10/2020/8/22 upon evaluation today patient appears to be doing well with regard to his wounds. He has been tolerating the dressing changes without complication. Fortunately he appears to be completely healed. I am very pleased in that regard. As is the patient his right leg is also doing well he did get a new recliner which is helping him keep his feet off the ground which I think is helped he is down 2 cm on the left compared to last week so I think there is some definite improvement here. Readmission 12/15/20: Mr. Dom Haverland is a 75 year old male with a past medical history of lymphedema, insulin-dependent type 2 diabetes, COPD and congestive heart failure that presents to the clinic for increased drainage from his legs bilaterally, Left greater than right. Patient has  lymphedema. He has been seen in our clinic on multiple occasions for this issue. He currently denies signs of infection. He has not been using his lymphedema pumps. 12/23/2020 upon evaluation today patient appears to be doing well with regard to his wounds. He has been tolerating the dressing changes he really does not have any open wounds just weeping areas of the legs bilaterally and fortunately seems to be doing better at this point. 12/28/2020 upon evaluation today patient's wound is actually showing signs of good improvement. There does not appear to be any evidence of infection which is great news and overall I am extremely pleased at this point. I think is very close to complete resolution. 01/04/2021 upon evaluation today patient actually appears to be completely healed which is great news. I do not see any signs of active infection currently which is also great news. In general I am extremely pleased with where we stand and I do believe that the patient is tolerating the dressing changes without complication. I believe he is ready to go back into his Velcro compression and I did see that they brought them with him today. Readmission: Patient presents today for follow-up he was last seen August 2. He is having some issues with some mild drainage and leaking from the left leg. This is something that has reopened and again is a recurrent issue for him. Subsequently he does wear his Velcro compression wraps though I think we may want to look into something a little bit better for him I am thinking that we may want to try the juxta fit compression which I think would do much more effective compression treatment overall for him. 04/08/2021 upon evaluation today patient appears to be doing well with regard to his legs. I do feel like he is showing signs of improvement which is great news. No fevers, chills, nausea, vomiting, or diarrhea. 04/15/2021 upon evaluation today patient's wounds actually appear  to be very close to complete resolution. He is legs are actually doing well left leg just has a small area of leaking at this point. Overall I think that we are very close to complete resolution based on what I am seeing. Nonetheless he seems to not be feeling too well today I really do not know what exactly is going on here. 04/22/2021 upon evaluation patient appears to be completely healed based on what I see today. I do not see  anything open both legs appear to be doing quite well. Objective Constitutional Obese and well-hydrated in no acute distress. Vitals Time Taken: 3:13 PM, Height: 66 in, Weight: 300 lbs, BMI: 48.4, Temperature: 98 F, Pulse: 74 bpm, Respiratory Rate: 18 breaths/min, Blood Pressure: 174/80 mmHg. Respiratory normal breathing without difficulty. Psychiatric this patient is able to make decisions and demonstrates good insight into disease process. Alert and Oriented x 3. pleasant and cooperative. General Notes: Patient showed complete epithelization and very pleased in this regard and I do not see any signs of infection which is great news in general I think that he is doing awesome today. Integumentary (Hair, Skin) Wound #41 status is Open. Original cause of wound was Gradually Appeared. The date acquired was: 03/05/2021. The wound has been in treatment 4 weeks. The wound is located on the Left,Circumferential Lower Leg. The wound measures 0cm length x 0cm width x 0cm depth; 0cm^2 area and 0cm^3 volume. There is no tunneling or undermining noted. There is a none present amount of drainage noted. There is no granulation within the wound bed. There is no necrotic tissue within the wound bed. Adam Merritt, Adam Merritt ([id number]) Assessment Active Problems ICD-10 Lymphedema, not elsewhere classified Venous insufficiency (chronic) (peripheral) Non-pressure chronic ulcer of other part of right lower leg with fat layer exposed Non-pressure chronic ulcer of other part of left lower  leg with fat layer exposed Plan Discharge From [facility]: Discharge from [facility] Complete Wear compression garments daily. Put garments on first thing when you wake up and remove them before bed. Moisturize legs daily after removing compression garments. Elevate, Exercise Daily and Avoid Standing for Long Periods of Time. WOUND #41: - Lower Leg Wound Laterality: Left, Circumferential Compression Stockings: [employer] (DME) Compression Amount: 30-40 mmHg (left) Compression Amount: 30-40 mmHg (right) 1. Would recommend currently that we go ahead and continue with the wound care measures as before and the patient is in agreement the plan. This includes the use of the Velcro compression wraps we will get a get him some new Farrow wrap basic wraps today which I think will be beneficial as well. 2. I would recommend he continue to elevate his legs as well he should also be using his lymphedema pumps I think this is of utmost importance. We will see him back for follow-up visit as needed at this point. Electronic Signature(s) Signed: 04/22/2021 5:14:41 PM By: Worthy Keeler PA-C Entered By: Worthy Keeler on 04/22/2021 17:14:41 Adam Merritt ([id number]) -------------------------------------------------------------------------------- SuperBill Details Patient Name: Adam Merritt Date of Service: 04/22/2021 Medical Record Number: [id number] Patient Account Number: [id number] Date of Birth/Sex: [birth year] (75 y.o. M) Treating RN: Carlene Coria Primary Care Provider: [name] Other Clinician: Referring Provider: [name] Treating Provider/Extender: Skipper Cliche in Treatment: 4 Diagnosis Coding ICD-10 Codes Code Description I89.0 Lymphedema, not elsewhere classified I87.2 Venous insufficiency (chronic) (peripheral) L97.812 Non-pressure chronic ulcer of other part of right lower leg with fat layer exposed L97.822 Non-pressure chronic ulcer of  other part of left lower leg with fat layer exposed Facility Procedures CPT4 Code: [id number] Description: [id number] - WOUND CARE VISIT-LEV 2 EST PT Modifier: Quantity: 1 Physician Procedures CPT4 Code: [id number] Description: 99213 - WC PHYS LEVEL 3 - EST PT Modifier: Quantity: 1 CPT4 Code: Description: ICD-10 Diagnosis Description I89.0 Lymphedema, not elsewhere classified I87.2 Venous insufficiency (chronic) (peripheral) L97.812 Non-pressure chronic ulcer of other part of right lower leg with fat la L97.822 Non-pressure chronic ulcer of  other part of left lower leg with fat lay Modifier: yer exposed er exposed Quantity: Electronic Signature(s) Signed: 04/22/2021 5:16:31 PM By: Worthy Keeler PA-C Previous Signature: 04/22/2021 3:50:05 PM Version By: Carlene Coria RN Entered By: Worthy Keeler on 04/22/2021 17:16:31

## 2021-05-03 ENCOUNTER — Ambulatory Visit: Payer: TRICARE For Life (TFL) | Admitting: Internal Medicine

## 2021-05-11 NOTE — Progress Notes (Signed)
Adam Merritt, Adam Merritt ([id number]) Visit Report for 04/22/2021 Arrival Information Details Patient Name: Adam Merritt, Adam Merritt Date of Service: 04/22/2021 1:15 PM Medical Record Number: [id number] Patient Account Number: [id number] Date of Birth/Sex: 1946/03/19 (75 y.o. M) Treating RN: Carlene Coria Primary Care Derry Arbogast: [name] Other Clinician: Referring Darrold Bezek: [name] Treating Rand Boller/Extender: Skipper Cliche in Treatment: 4 Visit Information History Since Last Visit All ordered tests and consults were completed: No Patient Arrived: Wheel Chair Added or deleted any medications: No Arrival Time: 13:08 Any new allergies or adverse reactions: No Accompanied By: wife Had a fall or experienced change in No Transfer Assistance: None activities of daily living that may affect Patient Identification Verified: Yes risk of falls: Secondary Verification Process Completed: Yes Signs or symptoms of abuse/neglect since last visito No Patient Requires Transmission-Based Precautions: No Hospitalized since last visit: No Patient Has Alerts: No Implantable device outside of the clinic excluding No cellular tissue based products placed in the center since last visit: Has Dressing in Place as Prescribed: Yes Has Compression in Place as Prescribed: Yes Pain Present Now: No Electronic Signature(s) Signed: 05/11/2021 1:44:34 PM By: Carlene Coria RN Entered By: Carlene Coria on 04/22/2021 13:13:52 Adam Merritt ([id number]) -------------------------------------------------------------------------------- Clinic Level of Care Assessment Details Patient Name: Adam Merritt Date of Service: 04/22/2021 1:15 PM Medical Record Number: [id number] Patient Account Number: [id number] Date of Birth/Sex: Sep 18, 1945 (75 y.o. M) Treating RN: Carlene Coria Primary Care Mushka Laconte: [name] Other Clinician: Referring Kurstin Dimarzo: [name] Treating Rhyse Loux/Extender: Skipper Cliche in Treatment: 4 Clinic  Level of Care Assessment Items TOOL 4 Quantity Score X - Use when only an EandM is performed on FOLLOW-UP visit 1 0 ASSESSMENTS - Nursing Assessment / Reassessment X - Reassessment of Co-morbidities (includes updates in patient status) 1 10 X- 1 5 Reassessment of Adherence to Treatment Plan ASSESSMENTS - Wound and Skin Assessment / Reassessment X - Simple Wound Assessment / Reassessment - one wound 1 5 []  - 0 Complex Wound Assessment / Reassessment - multiple wounds []  - 0 Dermatologic / Skin Assessment (not related to wound area) ASSESSMENTS - Focused Assessment []  - Circumferential Edema Measurements - multi extremities 0 []  - 0 Nutritional Assessment / Counseling / Intervention []  - 0 Lower Extremity Assessment (monofilament, tuning fork, pulses) []  - 0 Peripheral Arterial Disease Assessment (using hand held doppler) ASSESSMENTS - Ostomy and/or Continence Assessment and Care []  - Incontinence Assessment and Management 0 []  - 0 Ostomy Care Assessment and Management (repouching, etc.) PROCESS - Coordination of Care X - Simple Patient / Family Education for ongoing care 1 15 []  - 0 Complex (extensive) Patient / Family Education for ongoing care []  - 0 Staff obtains Programmer, systems, Records, Test Results / Process Orders []  - 0 Staff telephones HHA, Nursing Homes / Clarify orders / etc []  - 0 Routine Transfer to another Facility (non-emergent condition) []  - 0 Routine Hospital Admission (non-emergent condition) []  - 0 New Admissions / Biomedical engineer / Ordering NPWT, Apligraf, etc. []  - 0 Emergency Hospital Admission (emergent condition) X- 1 10 Simple Discharge Coordination []  - 0 Complex (extensive) Discharge Coordination PROCESS - Special Needs []  - Pediatric / Minor Patient Management 0 []  - 0 Isolation Patient Management []  - 0 Hearing / Language / Visual special needs []  - 0 Assessment of Community assistance (transportation, D/C planning, etc.) []  -  0 Additional assistance / Altered mentation []  - 0 Support Surface(s) Assessment (bed, cushion, seat, etc.) INTERVENTIONS - Wound Cleansing / Measurement Adam Merritt. (  [id number]) X- 1 5 Simple Wound Cleansing - one wound []  - 0 Complex Wound Cleansing - multiple wounds []  - 0 Wound Imaging (photographs - any number of wounds) []  - 0 Wound Tracing (instead of photographs) []  - 0 Simple Wound Measurement - one wound []  - 0 Complex Wound Measurement - multiple wounds INTERVENTIONS - Wound Dressings X - Small Wound Dressing one or multiple wounds 1 10 []  - 0 Medium Wound Dressing one or multiple wounds []  - 0 Large Wound Dressing one or multiple wounds []  - 0 Application of Medications - topical []  - 0 Application of Medications - injection INTERVENTIONS - Miscellaneous []  - External ear exam 0 []  - 0 Specimen Collection (cultures, biopsies, blood, body fluids, etc.) []  - 0 Specimen(s) / Culture(s) sent or taken to Lab for analysis []  - 0 Patient Transfer (multiple staff / Civil Service fast streamer / Similar devices) []  - 0 Simple Staple / Suture removal (25 or less) []  - 0 Complex Staple / Suture removal (26 or more) []  - 0 Hypo / Hyperglycemic Management (close monitor of Blood Glucose) []  - 0 Ankle / Brachial Index (ABI) - do not check if billed separately X- 1 5 Vital Signs Has the patient been seen at the hospital within the last three years: Yes Total Score: 65 Level Of Care: New/Established - Level 2 Electronic Signature(s) Signed: 05/11/2021 1:44:34 PM By: Carlene Coria RN Entered By: Carlene Coria on 04/22/2021 15:49:56 Adam Merritt ([id number]) -------------------------------------------------------------------------------- Encounter Discharge Information Details Patient Name: Adam Merritt Date of Service: 04/22/2021 1:15 PM Medical Record Number: [id number] Patient Account Number: [id number] Date of Birth/Sex: [birth year] (75 y.o. M) Treating RN: Carlene Coria Primary Care Cylis Ayars: [name] Other Clinician: Referring Jerimy Johanson: [name] Treating Rand Boller/Extender: Skipper Cliche in Treatment: 4 Encounter Discharge Information Items Discharge Condition: Stable Ambulatory Status: Wheelchair Discharge Destination: Home Transportation: Private Auto Accompanied By: wife Schedule Follow-up Appointment: Yes Clinical Summary of Care: Patient Declined Electronic Signature(s) Signed: 04/22/2021 3:49:31 PM By: Carlene Coria RN Entered By: Carlene Coria on 04/22/2021 15:49:30 Adam Merritt ([id number]) -------------------------------------------------------------------------------- Lower Extremity Assessment Details Patient Name: Adam Merritt Date of Service: 04/22/2021 1:15 PM Medical Record Number: [id number] Patient Account Number: [id number] Date of Birth/Sex: [birth year] (75 y.o. M) Treating RN: Carlene Coria Primary Care Rafaella Kole: [name] Other Clinician: Referring Osie Merkin: [name] Treating Leanette Eutsler/Extender: Jeri Cos [name]: 4 Edema Assessment Assessed: [Left: No] [Right: No] [Left: Edema] [Right: :] Calf Left: Right: Point of Measurement: 46 cm From Medial Instep 43 cm 58 cm Ankle Left: Right: Point of Measurement: 9 cm From Medial Instep 29 cm 34 cm Knee To Floor Left: Right: From Medial Instep 46 cm 46 cm Electronic Signature(s) Signed: 04/22/2021 2:11:17 PM By: Carlene Coria RN Entered By: Carlene Coria on 04/22/2021 14:11:16 Adam Merritt ([id number]) -------------------------------------------------------------------------------- Multi Wound Chart Details Patient Name: Adam Merritt Date of Service: 04/22/2021 1:15 PM Medical Record Number: [id number] Patient Account Number: [id number] Date of Birth/Sex: Feb 21, 1946 (75 y.o. M) Treating RN: Carlene Coria Primary Care Oneal Schoenberger: [name] Other Clinician: Referring Ariah Mower: [name] Treating Christy Friede/Extender: Skipper Cliche  in Treatment: 4 Vital Signs Height(in): 66 Pulse(bpm): 65 Weight(lbs): 300 Blood Pressure(mmHg): 174/80 Body Mass Index(BMI): 48 Temperature(F): 98 Respiratory Rate(breaths/min): 18 Photos: [N/A:N/A] Wound Location: Left, Circumferential Lower Leg N/A N/A Wounding Event: Gradually Appeared N/A N/A Primary Etiology: Venous Leg Ulcer N/A N/A Comorbid History: Anemia, Lymphedema, Chronic N/A N/A Obstructive Pulmonary Disease (COPD), Sleep Apnea, Congestive Heart Failure, Coronary  Artery Disease, Hypertension, Peripheral Venous Disease, Type II Diabetes, Osteoarthritis, Neuropathy, Received Chemotherapy Date Acquired: 03/05/2021 N/A N/A Weeks of Treatment: 4 N/A N/A Wound Status: Open N/A N/A Measurements L x W x D (cm) 0x0x0 N/A N/A Area (cm) : 0 N/A N/A Volume (cm) : 0 N/A N/A % Reduction in Area: 100.00% N/A N/A % Reduction in Volume: 100.00% N/A N/A Classification: Full Thickness Without Exposed N/A N/A Support Structures Exudate Amount: None Present N/A N/A Granulation Amount: None Present (0%) N/A N/A Necrotic Amount: None Present (0%) N/A N/A Exposed Structures: Fascia: No N/A N/A Fat Layer (Subcutaneous Tissue): No Tendon: No Muscle: No Joint: No Bone: No Epithelialization: Large (67-100%) N/A N/A Treatment Notes Electronic Signature(s) Signed: 05/11/2021 1:44:34 PM By: Carlene Coria RN Entered By: Carlene Coria on 04/22/2021 13:53:00 Adam Merritt ([id number]) -------------------------------------------------------------------------------- Multi-Disciplinary Care Plan Details Patient Name: Adam Merritt Date of Service: 04/22/2021 1:15 PM Medical Record Number: [id number] Patient Account Number: [id number] Date of Birth/Sex: 27-Jul-1945 (75 y.o. M) Treating RN: Carlene Coria Primary Care [name] [name]: [name] Other Clinician: Referring Isabelly Kobler: [name] Treating Niel Peretti/Extender: Jeri Cos [name]: 4 Active Inactive Electronic  Signature(s) Signed: 05/11/2021 1:44:34 PM By: Carlene Coria RN Entered By: Carlene Coria on 04/22/2021 13:52:15 Adam Merritt ([id number]) -------------------------------------------------------------------------------- Pain Assessment Details Patient Name: Adam Merritt Date of Service: 04/22/2021 1:15 PM Medical Record Number: [id number] Patient Account Number: [id number] Date of Birth/Sex: 10-08-1945 (75 y.o. M) Treating RN: Carlene Coria Primary Care Kijuana Ruppel: [name] Other Clinician: Referring Keenan Trefry: [name] Treating Kylen Schliep/Extender: Skipper Cliche in Treatment: 4 Active Problems Location of Pain Severity and Description of Pain Patient Has Paino No Site Locations Pain Management and Medication Current Pain Management: Electronic Signature(s) Signed: 05/11/2021 1:44:34 PM By: Carlene Coria RN Entered By: Carlene Coria on 04/22/2021 13:14:31 Adam Merritt ([id number]) -------------------------------------------------------------------------------- Patient/Caregiver Education Details Patient Name: Adam Merritt Date of Service: 04/22/2021 1:15 PM Medical Record Number: [id number] Patient Account Number: [id number] Date of Birth/Gender: [birth year] (75 y.o. M) Treating RN: Carlene Coria Primary Care Physician: [name] Other Clinician: Referring Physician: [name] Treating Physician/Extender: Skipper Cliche in Treatment: 4 Education Assessment Education Provided To: Patient Education Topics Provided Wound/Skin Impairment: Methods: Explain/Verbal Responses: State content correctly Electronic Signature(s) Signed: 05/11/2021 1:44:34 PM By: Carlene Coria RN Entered By: Carlene Coria on 04/22/2021 15:50:27 Adam Merritt ([id number]) -------------------------------------------------------------------------------- Wound Assessment Details Patient Name: Adam Merritt Date of Service: 04/22/2021 1:15 PM Medical Record Number: [id number] Patient Account  Number: [id number] Date of Birth/Sex: 1945/11/10 (75 y.o. M) Treating RN: Carlene Coria Primary Care Gigi Onstad: [name] Other Clinician: Referring Gursimran Litaker: [name] Treating Shomari Scicchitano/Extender: Jeri Cos [name]: 4 Wound Status Wound Number: [id number] Primary Venous Leg Ulcer Etiology: Wound Location: Left, Circumferential Lower Leg Wound Open Wounding Event: Gradually Appeared Status: Date Acquired: 03/05/2021 Comorbid Anemia, Lymphedema, Chronic Obstructive Pulmonary Weeks Of Treatment: 4 History: Disease (COPD), Sleep Apnea, Congestive Heart Failure, Clustered Wound: No Coronary Artery Disease, Hypertension, Peripheral Venous Disease, Type II Diabetes, Osteoarthritis, Neuropathy, Received Chemotherapy Photos Wound Measurements Length: (cm) 0 Width: (cm) 0 Depth: (cm) 0 Area: (cm) 0 Volume: (cm) 0 % Reduction in Area: 100% % Reduction in Volume: 100% Epithelialization: Large (67-100%) Tunneling: No Undermining: No Wound Description Classification: Full Thickness Without Exposed Support Structure Exudate Amount: None Present s Wound Bed Granulation Amount: None Present (0%) Exposed Structure Necrotic Amount: None Present (0%) Fascia Exposed: No Fat Layer (Subcutaneous Tissue) Exposed: No Tendon Exposed: No Muscle Exposed: No  Joint Exposed: No Bone Exposed: No Electronic Signature(s) Signed: 05/11/2021 1:44:34 PM By: Carlene Coria RN Entered By: Carlene Coria on 04/22/2021 13:23:34 Adam Merritt ([id number]) -------------------------------------------------------------------------------- [place] Details Patient Name: Adam Merritt Date of Service: 04/22/2021 1:15 PM Medical Record Number: [id number] Patient Account Number: [id number] Date of Birth/Sex: Dec 02, 1945 (75 y.o. M) Treating RN: Carlene Coria Primary Care Christalynn Bo[place]: [name] Other Clinician: Referring Renzo Vincelette: [name] Treating Aaryn Sermon/Extender: Skipper Cliche in Treatment:  4 Vital Signs Time Taken: 15:13 Temperature (F): 98 Height (in): 66 Pulse (bpm): 74 Weight (lbs): 300 Respiratory Rate (breaths/min): 18 Body Mass Index (BMI): 48.4 Blood Pressure (mmHg): 174/80 Reference Range: 80 - 120 mg / dl Electronic Signature(s) Signed: 05/11/2021 1:44:34 PM By: Carlene Coria RN Entered By: Carlene Coria on 04/22/2021 13:14:17

## 2021-05-17 ENCOUNTER — Ambulatory Visit: Payer: Medicare PPO | Admitting: Internal Medicine

## 2021-05-20 ENCOUNTER — Encounter: Payer: Medicare PPO | Attending: Physician Assistant | Admitting: Physician Assistant

## 2021-05-20 ENCOUNTER — Other Ambulatory Visit: Payer: Self-pay

## 2021-05-20 DIAGNOSIS — E11622 Type 2 diabetes mellitus with other skin ulcer: Secondary | ICD-10-CM | POA: Diagnosis not present

## 2021-05-20 DIAGNOSIS — E1140 Type 2 diabetes mellitus with diabetic neuropathy, unspecified: Secondary | ICD-10-CM | POA: Insufficient documentation

## 2021-05-20 DIAGNOSIS — L97812 Non-pressure chronic ulcer of other part of right lower leg with fat layer exposed: Secondary | ICD-10-CM | POA: Insufficient documentation

## 2021-05-20 DIAGNOSIS — L97822 Non-pressure chronic ulcer of other part of left lower leg with fat layer exposed: Secondary | ICD-10-CM | POA: Insufficient documentation

## 2021-05-20 DIAGNOSIS — I509 Heart failure, unspecified: Secondary | ICD-10-CM | POA: Insufficient documentation

## 2021-05-20 DIAGNOSIS — I110 Hypertensive heart disease with heart failure: Secondary | ICD-10-CM | POA: Insufficient documentation

## 2021-05-20 DIAGNOSIS — I872 Venous insufficiency (chronic) (peripheral): Secondary | ICD-10-CM | POA: Diagnosis not present

## 2021-05-20 DIAGNOSIS — L89612 Pressure ulcer of right heel, stage 2: Secondary | ICD-10-CM | POA: Diagnosis not present

## 2021-05-20 DIAGNOSIS — I890 Lymphedema, not elsewhere classified: Secondary | ICD-10-CM | POA: Diagnosis not present

## 2021-05-20 DIAGNOSIS — Z794 Long term (current) use of insulin: Secondary | ICD-10-CM | POA: Insufficient documentation

## 2021-05-20 DIAGNOSIS — E11621 Type 2 diabetes mellitus with foot ulcer: Secondary | ICD-10-CM | POA: Diagnosis not present

## 2021-05-20 DIAGNOSIS — J449 Chronic obstructive pulmonary disease, unspecified: Secondary | ICD-10-CM | POA: Insufficient documentation

## 2021-05-20 NOTE — Progress Notes (Addendum)
AMBROSIO, REUTER ([id number]) Visit Report for 05/20/2021 Chief Complaint Document Details Patient Name: Adam Merritt, Adam Merritt Date of Service: 05/20/2021 12:30 PM Medical Record Number: [id number] Patient Account Number: [id number] Date of Birth/Sex: 1945/10/10 (75 y.o. M) Treating RN: Carlene Coria Primary Care Provider: [name] Other Clinician: Referring Provider: [name] Treating Provider/Extender: Skipper Cliche in Treatment: 8 Information Obtained from: Patient Chief Complaint Bilateral LE edema and weeping ulcers Electronic Signature(s) Signed: 05/20/2021 1:00:58 PM By: Worthy Keeler PA-C Entered By: Worthy Keeler on 05/20/2021 13:00:58 Adam Merritt ([id number]) -------------------------------------------------------------------------------- HPI Details Patient Name: Adam Merritt Date of Service: 05/20/2021 12:30 PM Medical Record Number: [id number] Patient Account Number: [id number] Date of Birth/Sex: [birth year] (75 y.o. M) Treating RN: Carlene Coria Primary Care Provider: [name] Other Clinician: Referring Provider: [name] Treating Provider/Extender: Skipper Cliche in Treatment: 8 History of Present Illness HPI Description: 10/18/17-He is here for initial evaluation of bilateral lower extremity ulcerations in the presence of venous insufficiency and lymphedema. He has been seen by vascular medicine in the past, Dr. Lucky Cowboy, last seen in 2016. He does have a history of abnormal ABIs, which is to be expected given his lymphedema and venous insufficiency. According to Epic, it appears that all attempts for arterial evaluation and/or angiography were not follow through with by patient. He does have a history of being seen in lymphedema clinic in 2018, stopped going approximately 6 months ago stating "it didn't do any good". He does not have lymphedema pumps, he does not have custom fit compression wrap/stockings. He is diabetic and his recent A1c last month was  7.6. He admits to chronic bilateral lower extremity pain, no change in pain since blister and ulceration development. He is currently being treated with Levaquin for bronchitis. He has home health and we will continue. 10/25/17-He is here in follow-up evaluation for bilateral lower extremity ulcerationssubtle he remains on Levaquin for bronchitis. Right lower extremity with no evidence of drainage or ulceration, persistent left lower extremity ulceration. He states that home health has not been out since his appointment. He went to [facility] on Tuesday, studies revealed: RIGHT ABI 0.9, TBI 0.6 LEFT ABI 1.1, TBI 0.6 with triphasic flow bilaterally. We will continue his same treatment plan. He has been educated on compression therapy and need for elevation. He will benefit from lymphedema pumps 11/01/17-He is here in follow-up evaluation for left lower extremity ulcer. The right lower extremity remains healed. He has home health services, but they have not been out to see the patient for 2-3 weeks. He states it home health physical therapy changed his dressing yesterday after therapy; he placed Ace wrap compression. We are still waiting for lymphedema pumps, reordered d/t need for company change. 11/08/17-He is here in follow-up evaluation for left lower extremity ulcer. It is improved. Edema is significantly improved with compression therapy. We will continue with same treatment plan and he will follow-up next week. No word regarding lymphedema pumps 11/15/17-He is here in follow-up evaluation for left lower extremity ulcer. He is healed and will be discharged from wound care services. I have reached out to [facility] regarding his lymphedema pumps. They have been unable to reach the patient; the contact number they had with the patient's wife's cell phone and she has not answered any unrecognized calls. Contact should be made today, trial planned for next week; [facility]  will continue to follow 11/27/17 on evaluation today patient has multiple blistered areas over the right  lower extremity his left lower extremity appears to be doing okay. These blistered areas show signs of no infection which is great news. With that being said he did have some necrotic skin overlying which was mechanically debrided away with saline and gauze today without complication. Overall post debridement the wounds appear to be doing better but in general his swelling seems to be increased. This is obviously not good news. I think this is what has given rise to the blisters. 12/04/17 on evaluation today patient presents for follow-up concerning his bilateral lower extremity edema in the right lower extremity ulcers. He has been tolerating the dressing changes without complication. With that being said he has had no real issues with the wraps which is also good news. Overall I'm pleased with the progress he's been making. 12/11/17 on evaluation today patient appears to be doing rather well in regard to his right lateral lower extremity ulcer. He's been tolerating the dressing changes without complication. Fortunately there does not appear to be any evidence of infection at this time. Overall I'm pleased with the progress that is being made. Unfortunately he has been in the hospital due to having what sounds to be a stomach virus/flu fortunately that is starting to get better. 12/18/17 on evaluation today patient actually appears to be doing very well in regard to his bilateral lower extremities the swelling is under fairly good control his lymphedema pumps are still not up and running quite yet. With that being said he does have several areas of opening noted as far as wounds are concerned mainly over the left lower extremity. With that being said I do believe once he gets lymphedema pumps this would least help you mention some the fluid and preventing this from occurring. Hopefully that will be set  up soon sleeves are Artie in place at his home he just waiting for the machine. 12/25/17 on evaluation today patient actually appears to be doing excellent in fact all of his ulcers appear to have resolved his legs appear very well. I do think he needs compression stockings we have discussed this and they are actually going to go to [place] today to elastic therapy to get this fitted for him. I think that is definitely a good thing to do. Readmission: 04/09/18 upon evaluation today this patient is seen for readmission due to bilateral lower extremity lymphedema. He has significant swelling of his extremities especially on the left although the right is also swollen he has weeping from both sides. There are no obvious open wounds at this point. Fortunately he has been doing fairly well for quite a bit of time since I last saw him. Nonetheless unfortunately this seems to have reopened and is giving quite a bit of trouble. He states this began about a week ago when he first called [place] to get in to be seen. No fevers, chills, nausea, or vomiting noted at this time. He has not been using his lymphedema pumps due to the fact that they won't fit on his leg at this point likewise is also not been using his compression for essentially the same reason. 04/16/18 upon evaluation today patient actually appears to be doing a little better in regard to the fluid in his bilateral lower extremities. With that being said he's had three falls since I saw him last week. He also states that he's been feeling very poorly. I was concerned last week and feel like that the concern is still there as far as the congestion  in his chest is concerned he seems to be breathing about the same as last week but again he states he's very weak he's not even able to walk further than from the chair to the door. His wife had to buy a wheelchair just to be able to get them out of the house to get to the appointment today. This has me very  concerned. 04/23/18 on evaluation today patient actually appears to be doing much better than last week's evaluation. At that time actually had to transport him to the ER via EMS and he subsequently was admitted for acute pulmonary edema, acute renal failure, and acute congestive heart failure. Fortunately he is doing much better. Apparently they did dialyze him and were able to take off roughly 35 pounds of fluid. Nonetheless he is feeling much better both in regard to his breathing and he's able to get around much better at this time compared to previous. Overall I'm very CHOICE, KLEINSASSER ([id number]) happy with how things are at this time. There does not appear to be any evidence of infection currently. No fevers, chills, nausea, or vomiting noted at this time. 04/30/2018 patient seen today for follow-up and management of bilateral lower extremity lymphedema. He did express being more sad today than usual due to the recent loss of his dog. He states that he has been compliant with using the lymphedema pumps. However he does admit a minute over the last 2-3 days he has not been using the pumps due to the recent loss of his dog. At this time there is no drainage or open wounds to his lower extremities. The left leg edema is measuring smaller today. Still has a significant amount of edema on bilateral lower extremities With dry flaky skin. He will be referred to the lymphedema clinic for further management. Will continue 3 layer compression wraps and follow-up in 1-2 weeks.Denies any pain, fever, chairs recently. No recent falls or injuries reported during this visit. 05/07/18 on evaluation today patient actually appears to be doing very well in regard to his lower extremities in general all things considered. With that being said he is having some pain in the legs just due to the amount of swelling. He does have an area where he had a blister on the left lateral lower extremity this is open at this  point other than that there's nothing else weeping at this time. 05/14/18 on evaluation today patient actually appears to be doing excellent all things considered in regard to his lower extremities. He still has a couple areas of weeping on each leg which has continued to be the issue for him. He does have an appointment with the lymphedema clinic although this isn't until February 2020. That was the earliest they had. In the meantime he has continued to tolerate the compression wraps without complication. 05/28/18 on evaluation today patient actually appears to be doing more poorly in regard to his left lower extremity where he has a wound open at this point. He also had a fall where he subsequently injured his right great toe which has led to an open wound at the site unfortunately. He has been tolerating the dressing changes without complication in general as far as the wraps are concerned that he has not been putting any dressing on the left 1st toe ulcer site. 06/11/18 on evaluation today patient appears to be doing much worse in regard to his bilateral lower extremity ulcers. He has been tolerating the dressing changes without complication  although his legs have not been wrapped more recently. Overall I am not very pleased with the way his legs appear. I do believe he needs to be back in compression wraps he still has not received his compression wraps from the [facility] hospital as of yet. 06/18/18 on evaluation today patient actually appears to be doing significantly better than last time I saw him. He has been tolerating the compression wraps without complication in the circumferential ulcers especially appear to be doing much better. His toe ulcer on the right in regard to the great toe is better although not as good as the legs in my pinion. No fevers chills noted 07/02/18 on evaluation today patient appears to be doing much better in regard to his lower extremity ulcers. Unfortunately since I last  saw him he's had the distal portion of his right great toe if he dated it sounds as if this actually went downhill very quickly. I had only seen him a few days prior and the toe did not appear to be infected at that point subsequently became infected very rapidly and it was decided by the surgeon that the distal portion of the toe needed to be removed. The patient seems to be doing well in this regard he tells me. With that being said his lower extremities are doing better from the standpoint of the wounds although he is significantly swollen at this point. 07/09/18 on evaluation today patient appears to be doing better in regard to the wounds on his lower extremities. In fact everything is almost completely healed he is just a small area on the left posterior lower extremity that is open at this point. 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 With that being said he needs some kind of compression it does sound like he may not be wearing his compression, that is the wraps, during the entire time between when he's here visit to visit. Apparently his wife took the current one off because it began to "fall apart". 07/16/18 on evaluation today patient appears to be extremely swollen especially in regard to his left lower extremity unfortunately. He also has a new skin tear over the left lower extremity and there's a smaller area on the right lower extremity as well. Unfortunately this seems to be due in part to blistering and fluid buildup in his leg. He did get the reduction wraps that were ordered by the [facility] hospital for him to go to lymphedema clinic. With that being said his wounds on the legs have not healed to the point to where they would likely accept them as a patient lymphedema clinic currently. We need to try to  get this to heal. With that being said he's been taking his wraps off which is not doing him any favors at this point. In fact this is probably quite counterproductive compared to what needs to occur. We will likely need to increase to a four layer compression wrap and continue to also utilize elevation and he has to keep the wraps on not take them off as he's been doing currently hasn't had a wrap on since Saturday. 07/23/18 on evaluation today patient appears to be doing much better in regard to his bilateral lower extremities. In fact his left lower extremity which was the largest is actually 15 cm smaller today compared  to what it was last time he was here in our clinic. This is obviously good news after just one week. Nonetheless the differences he actually kept the wraps on during the entire week this time. That's not typical for him. I do believe he understands a little bit better now the severity of the situation and why it's important for him to keep these wraps on. 07/30/18 on evaluation today patient actually appears to be doing rather well in regard to his lower extremities. His legs are much smaller than they have been in the past and he actually has only one very small rudder superficial region remaining that is not closed on the left lateral/posterior lower extremity even this is almost completely close. I do believe likely next week he will be healed without any complications. I do think we need to continue the wraps however this seems to be beneficial for him. I also think it may be a good time for [place] to go ahead and see about getting the appointment with the lymphedema clinic which is supposed to be made for him in order to keep this moving along and hopefully get them into compression wraps that will in the end help him to remain healed. 08/06/18 on evaluation today patient actually appears to be doing very well in regard as bilateral lower extremities. In fact his wounds appear to  be completely healed at this time. 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 He is in the process of getting appointment with the lymphedema clinic we have made this referral were just waiting to hear back on the schedule time. We need to follow up on that today as well. 08/13/18 on evaluation today patient actually appears to be doing very well in regard to his bilateral lower extremities there are no open wounds at this point. We are gonna go ahead and see about ordering the Velcro compression wraps for him had a discussion with them about [place] doing it versus the [place] they feel like they can definitely afford going ahead and get the wraps themselves and they would prefer to try to avoid having to go to the lymphedema clinic if it all possible which I completely understand. As long as he has good compression I'm okay either way. 3/17//20 on evaluation today patient actually appears to be doing well in regard to his bilateral lower extremity ulcers. He has been tolerating the dressing changes without complication specifically the compression wraps. Overall is had no issues my fingers finding that I see at this point is that he is having trouble with constipation. He tells me he has not been able to go to the bathroom for about six days. He's taken to over-the- counter oral laxatives unfortunately this is not helping. He has not contacted his doctor. GRAYDON, FOFANA ([id number]) 08/27/18 on evaluation today patient appears to be doing fairly well in regard to his lower extremities at this point. There does not appear to be any new altars is swelling is very well controlled. We are still waiting for his Velcro compression wraps to arrive that should be sometime in the next week is Artie sent the check for these. 09/03/18 on evaluation today patient appears to be doing excellent in regard to his bilateral lower extremities which he shows no signs of  wound openings in regard to this point. He does have his Velcro compression wraps which did arrive in the mail since I saw him last week.  Overall he is doing excellent in my pinion. 09/13/18 on evaluation today patient appears to be doing very well currently in regard to the overall appearance of his bilateral lower extremities although he's a little bit more swollen than last time we saw him. At that point he been discharged without any open wounds. Nonetheless he has a small open wound on the posterior left lower extremity with some evidence of cellulitis noted as well. Fortunately I feel like he has made good progress overall with regard to his lower extremities from were things used to be. 09/20/18 on evaluation today patient actually appears to be doing much better. The erythematous lower extremity is improving wound itself which is still open appears to be doing much better as far as but appearance as well as pain is concerned overall very pleased in this regard. There's no signs of active infection at this time. 09/27/18 on evaluation today patient's wounds on the lower extremity actually appear to be doing fairly well at this time which is good news. There is no evidence of active infection currently and again is just as left lower extremity were there any wounds at this point anyway. I believe they may be completely healed but again I'm not 100% sure based on evaluation today. I think one more week of observation would likely be a good idea. 10/04/18 on evaluation today patient actually appears to be doing excellent in regard to the left lower extremity which actually appears to be completely healed as of today. Unfortunately he's been having issues with his right lower extremity have a new wound that has opened. Fortunately there's no evidence of active infection at this time which is good news. 10/10/18 upon evaluation today patient actually appears to be doing a little bit worse with the new  open area on his right posterior lower extremity. He's been tolerating the dressing changes without complication. Right now we been using his compression wraps although I think we may need to switch back to actually performing bilateral compression wraps are in the clinic. No fevers, chills, nausea, or vomiting noted at this time. 10/17/18 on evaluation today patient actually appears to be doing quite well in regard to his bilateral lower extremity ulcers. He has been tolerating the reps without complication although he would prefer not to rewrap his legs as of today. Fortunately there's no signs of active infection which is good news. No fevers, chills, nausea, or vomiting noted at this time. 10/24/18 on evaluation today patient appears to be doing very well in regard to his lower extremities. His right lower extremity is shown signs of healing and his left lower [place] though not healed appears to be improving which is excellent news. Overall very pleased with how things seem to be progressing at this time. The patient is likewise happy to hear this. His Velcro compression wraps however have not been put on properly will gonna show his wife how to do that properly today. 10/31/18 on evaluation today patient appears to be doing more poorly in regard to his left lower extremity in particular although both lower extremities actually are showing some signs of being worse than my previous evaluation. Unfortunately I'm just not sure that his compression stockings even with the use of the compression/lymphedema pumps seem to be controlling this well. Upon further questioning he tells me that he also is not able to lie flat in the bed due to his congestive heart failure and difficulty breathing. For that reason he sleeps in his  recliner. To make matters worse his recliner also cannot even hold his legs up so instead of even being somewhat elevated they pretty much hang down to the floor. This is the way he  sleeps each night which is definitely counterproductive to everything else that were attempting to do from the standpoint of controlling his fluid. Nonetheless I think that he potentially could benefit from a hospital bed although this would be something that his primary care provider would likely have to order since anything that is order on our side has to be directly related to wound care and again the hospital bed is not necessarily a direct relation to although I think it does contribute to his overall wound status on his lower extremities. 11/07/18 on evaluation today patient appears to be doing better in regard to his bilateral lower extremity. He's been tolerating the dressing changes without complication. We did in the interim since I last saw him switch to just using extras orbiting new alginate and that seems to have done much better for him. I'm very pleased with the overall progress that is made. 11/14/18 on evaluation today patient appears to be redoing rather well in regard to his left lower extremity ulcers which are the only ones remaining at this point. Fortunately there's no signs of active infection at this time which is good news. Overall been very pleased with how things seem to be progressing currently. No fevers, chills, nausea, or vomiting noted at this time. 11/20/17 on evaluation today patient actually appears to be doing quite well in regard to his lower extremities on the left on the right he has several blisters that showed up although there's some question about whether or not he has had his broker compression wraps on like he was supposed to or not. Fortunately there's no signs of active infection at this time which is good news. Unfortunately though he is doing well from the standpoint of the left leg the right leg is not doing as well again this is when we did not wrap last week. 11/28/18 on evaluation today patient appears to be doing well in regard to his bilateral lower  extremities is left appears to be healed is right is not healed but is very close to being so. Overall very pleased with how things seem to be progressing. Patient is likewise happy that things are doing well. 12/09/18 on evaluation today patient actually appears to be completely healed which is excellent news. He actually seems to be doing well in regard to the swelling of the bilateral lower extremities which is also great news. Overall very pleased with how things seem to be progressing. Readmission: 03/18/2019 upon evaluation today patient presents for reevaluation concerning issues that he is having with his left lower extremity where he does have a wound noted upon inspection today. He also has a wound on the right second toe on the tip where it is apparently been rubbing in his shoe I did look at issues and you can actually see where this has been occurring as well. Fortunately there is no signs of infection with regard to the toe necessarily although it is very swollen compared to normal that does have me somewhat concerned about the possibility of further evaluation for infection/osteomyelitis. I would recommend an x-ray to start with. Otherwise he states that it is been 1-2 weeks that he has had the draining in regard to left lower extremity he does not know how this happened he has been wearing his  compression appropriately and I do see that as well today but nonetheless I do think that he needs to continue to be very cautious with regard to elevation as well. TAFT, WORTHING ([id number]) 03/25/2019 on evaluation today patient appears to be doing much better in regard to his lower extremity at this time. That is on the left. He did culture positive for Pseudomonas but the good news is he seems to be doing much better the gentamicin we applied topically seems to have done a great job for him. There is no signs of active infection at this time systemically and locally he is doing much better.  No fevers, chills, nausea, vomiting, or diarrhea. In regard to his right toe this seems to be doing much better there is very little pressure noted at this point I did clean up some of the callus today around the edges of the wound as well as the surface of the wound but to be honest he is progressing quite nicely. 10/30; the area on the left anterior lower tibia area is healed over. His edema control is adequate even though his use of the compression pumps seems very intermittent. He has a juxta lite stocking on the right leg and a think there is 1 for the left leg at home. His wife is putting these on. He has an area on the plantar right second toe which is a hammertoe they are trying to offload this 04/08/2019 on evaluation today patient appears to be doing well in regard to his lower extremities which is good news. We are seeing him today for his toe ulcer which is giving him a little bit more trouble but again still seems to be doing better at this point which is good news. He is going require some sharp debridement in regard to the toe today however. 04/15/2019 on evaluation today patient actually appears to be doing quite well with regard to his toe ulcer. I am very pleased with how things are doing in that regard. With regard to his lower extremity edema in general he tells me he is not been using the lymphedema pumps regularly although he does not use them. I think that he needs to use them more regularly he does have a small blister on the left lower extremity this is not open at this point but obviously it something that can get worse if he does not keep the compression going and the pumps going as well. 04/22/2019 on evaluation today patient appears to be doing well with regard to his toe ulcer. He has been tolerating the dressing changes without complication. This is measuring slightly smaller this week as compared to last week. Fortunately there is no signs of active infection at this  time. 05/06/2019 on evaluation today patient actually appears to be doing excellent. In fact his toe appears to be completely healed which is great news. There is no signs of active infection at this time. Readmission: Patient presents today for follow-up after having been discharged at the beginning of December 2020. He states that in recent weeks things have reopened and has been having more trouble at this point. With that being said fortunately there is no signs of active infection at this time. No fever chills noted. Nonetheless he also does have an issue with one of his toes as well that seems to be somewhat this is on the right foot second toe. 07/25/2019 upon evaluation today patient's lower extremities appear to be doing excellent bilaterally. I really  feel like he is showing signs of great improvement and in fact I think he is close to healing which is great news. There is no signs of active infection at this time. No fevers, chills, nausea, vomiting, or diarrhea. 07/31/2019 upon evaluation today patient appears to be doing excellent and in fact I think may be completely healed in regard to his right lower extremity that were still to monitor this 1 more week before closing it out. The left lower extremity is measuring smaller although not completely closed seems to be doing excellent. 08/07/2019 upon evaluation today patient appears to be doing excellent in regard to his wounds. In fact the right lower extremity is completely healed there is no issues here. The left lower extremity has 1 very small area still remaining fortunately there is no signs of infection and overall I think he is very close to closure of the here as well. 08/14/19 upon evaluation today patient appears to be doing excellent in regard to his lower extremities. In fact he appears to be completely healed as of today. Fortunately there's no signs of active infection at this time. No fevers, chills, nausea, or vomiting noted  at this time. READMISSION 09/26/2019 This is a 75 year old man who is well-known to this clinic having just been discharged on 08/14/2019. He has known lymphedema and chronic venous insufficiency. He has Farrow wrap stockings and also external compression pumps. He does not use the external compression pumps however. He does however apparently fairly faithfully use the stockings. They have not been lotion in his legs. He has developed new wounds on the right anterior as well as the left medial left lateral and left posterior calf. He returns for our review of this Past medical history includes coronary artery disease, hypertension, congestive heart failure, COPD, venous insufficiency with lymphedema, type 2 diabetes. He apparently has compression pumps, Farrow wraps and at 1 point a hospital bed although I believe he sleeps in a recliner 10/03/2019 upon evaluation today patient appears to be doing better with regard to his wounds in general. He has been tolerating the dressing changes without complication. Fortunately there is no signs of active infection. No fevers, chills, nausea, vomiting, or diarrhea. I do believe the compression wraps are helping as they always have in the past. 10/10/2019 upon evaluation today patient appears to be doing excellent at this point. His right lower extremity is measuring much better and in fact is completely healed. His left lower extremity is also measuring better though not healed seems to be doing excellent at this point which is great news. Overall I am very pleased with how things appear currently. 10/27/2019 upon evaluation today patient appears to be doing quite well with regard to his wounds currently. He has been tolerating the dressing changes without complication. Fortunately there is no signs of active infection at this time. In fact he is almost completely healed on the left and has just a very small area open and on the right he is completely  healed. 11/04/2019 upon evaluation today patient appears to be doing quite well with regard to his wounds. In fact he appears to be quite possibly completely healed on the left lower extremity now as well the right lower extremity is still doing well. With that being said unfortunately though this may be healed I think it is a very fragile area and I cannot even confirm there is not a very small opening still remaining. I really think he may benefit from 1 additional  weeks wrapping before discontinuing. 11/10/2019 upon evaluation today patient appears to be doing well in regard to his original wounds in fact everything that we were treating last week is completely healed on the left. Unfortunately he has a new skin tear just above where the wrap slipped down due to a fall he sustained causing this injury. 11/17/2019 on evaluation today patient appears to be doing better with regard to his wound. He has been tolerating the dressing changes without JERON, GRAHN. ([id number]) complication. Fortunately there is no signs of active infection at this time. No fevers, chills, nausea, vomiting, or diarrhea. 11/24/2019 upon evaluation today patient appears to be doing well with regard to his wound. This is making good progress there does not appear to be signs of active infection but overall I do feel like he is headed in the correct direction. Overall he is tolerating the compression wrap as well. 12/02/2019 upon evaluation today patient appears to be doing well with regard to his leg ulcer. In fact this appears to be completely healed and this is the last of the wounds that he had but still the left anterior lower extremity. Fortunately there is no signs of active infection at this time. No fevers, chills, nausea, vomiting, or diarrhea. Readmission: 02/05/2020 on evaluation today patient appears to be doing well with regard to his wound all things considered. He actually tells me that a couple weeks ago he had  trouble with his wraps and therefore took him off for a couple of days in order to give his legs a break. It was during this time that he actually developed increased swelling and edema of his legs and blisters on both legs. That is when he called to make the appointment. Nonetheless since that time he began wearing his compression wraps which are Velcro in the meantime and has done extremely well with this. In fact his leg appears to be under good control as far as edema is concerned today it is nothing like what I am seeing when he has come in for evaluations in the past. They have been using some of the silver alginate at home which has been beneficial for him. Fortunately there is no signs of active infection at this time. No fevers, chills, nausea, vomiting, or diarrhea. 02/19/2020 on evaluation today patient unfortunately does not appear to be doing quite as well as I would like to see. He has no signs of active infection at this time but unfortunately he is not doing well in regard to the overall appearance of his left leg. He has a couple other areas that are weeping now and the leg is much more swollen. I think that he needs to actually have a compression wrap to try to manage this. 02/26/2020 on evaluation today patient appears to be doing well with regard to his left lower extremity ulcer. Fortunately the swelling is down I do believe the pressure wrap seems to be doing quite well. Fortunately there is no signs of active infection at this time. 03/05/2020 upon evaluation today patient appears to be doing excellent in regard to his legs at this point. Fortunately there is no signs of active infection which is great news. Overall he feels like he is completely healed which is great news. Readmission: 05/20/2020 upon evaluation today patient appears to be doing well with regard to his leg ulcer all things considered. He is actually coming back to see [place] after having had a reopening of the ulcers  on his  left leg in the past several weeks. He is out of dressing supplies and his wife is been try to take care of this as best she can. 06/10/2020 upon evaluation today patient unfortunately appears to be doing significantly worse today compared to when I last saw him. He and his wife both tell me that "there has been a lot going on". I did not actually go into detail about everything that has been happening but nonetheless he has not obviously been wearing his compression. He brings them with him today but they were not on. I am not even certain to be honest that he could get those on at this point. Nonetheless I do believe that he is going to require compression wrapping at some point shortly but right now we need to try to get what appears to be infection under control at this time. 06/28/2020 upon evaluation today patient appears to be doing well with regard to 06/28/2020 upon evaluation today patient appears to be doing well pretty well in regard to his left leg which is much better than previous. With that being said in regard to his right leg he does seem to be having some issues with a new wound after he sustained a fall. This fortunately is not too deep but does appear to be something we need to address. Neither deep which is good. 07/05/2020 on evaluation today patient appears to be doing well with regard to his wounds. In fact everything on the left leg appears to be pretty much healed on the right leg this is very close though not completely closed yet. Fortunately there is no evidence of active infection at this time. No fevers, chills, nausea, vomiting, or diarrhea. 07/13/2019 upon evaluation today patient appears to be making good progress which is great news. Overall I am extremely pleased with where things stand today. There does not appear to be any signs of active infection which is great news and overall his left leg appears healed still the right leg though not healed does seem to be  making good progress which is excellent news. He is having no pain. 07/19/2020 on evaluation today patient appears to be doing well with regard to his wound. He has been tolerating the dressing changes without complication. Fortunately there does not appear to be any signs of active infection at this time. No fevers, chills, nausea, vomiting, or diarrhea. Readmission: 08/20/2020 on evaluation today patient presents for reevaluation here in the clinic concerning issues that he has been having with his bilateral lower extremities. Unfortunately this is an ongoing reoccurring issue. He tells me that he is really not certain exactly what caused the issue although as I discussed this further with him it appears that he has been sitting for the most part and sleeping in his chair with his feet on the ground he is not really elevating at all in fact the chair does not even have a reclining function therefore it is basically just him sitting with his feet on the ground. I think this alone has probably led to his increased swelling he is also having more difficulty walking which means that he is pumping less blood flow through his legs anyway as far as the venous stasis is concerned. All in all I think this is leading to a downward spiral of him not being able to walk very well as his legs are so heavy, they begin to weep, and overall he just does not seem to be doing as well as  it was when I last saw him. 08/27/2020 upon evaluation today patient's legs actually are showing signs of improvement which is great news. There does not appear to be any evidence of infection which is also excellent news. I am extremely pleased with where things stand today. No fevers, chills, nausea, vomiting, or diarrhea. 09/03/20 upon evaluation today patient appears to be doing excellent in regard to his wounds currently. He has been tolerating the dressing changes without complication and overall I am extremely pleased with where  things stand today. No fevers, chills, nausea, vomiting, or diarrhea. Overall he is healed on the right leg although the left leg not being completely healed still is doing significantly better. 09/10/2020/8/22 upon evaluation today patient appears to be doing well with regard to his wounds. He has been tolerating the dressing changes without complication. Fortunately he appears to be completely healed. I am very pleased in that regard. As is the patient his right leg is also doing well he did get a new recliner which is helping him keep his feet off the ground which I think is helped he is down 2 cm on the left compared to last week so I think there is some definite improvement here. AKILI, CORSETTI ([id number]) Readmission 12/15/20: Mr. Armstead Heiland is a 75 year old male with a past medical history of lymphedema, insulin-dependent type 2 diabetes, COPD and congestive heart failure that presents to the clinic for increased drainage from his legs bilaterally, Left greater than right. Patient has lymphedema. He has been seen in our clinic on multiple occasions for this issue. He currently denies signs of infection. He has not been using his lymphedema pumps. 12/23/2020 upon evaluation today patient appears to be doing well with regard to his wounds. He has been tolerating the dressing changes he really does not have any open wounds just weeping areas of the legs bilaterally and fortunately seems to be doing better at this point. 12/28/2020 upon evaluation today patient's wound is actually showing signs of good improvement. There does not appear to be any evidence of infection which is great news and overall I am extremely pleased at this point. I think is very close to complete resolution. 01/04/2021 upon evaluation today patient actually appears to be completely healed which is great news. I do not see any signs of active infection currently which is also great news. In general I am extremely pleased with  where we stand and I do believe that the patient is tolerating the dressing changes without complication. I believe he is ready to go back into his Velcro compression and I did see that they brought them with him today. Readmission: Patient presents today for follow-up he was last seen August 2. He is having some issues with some mild drainage and leaking from the left leg. This is something that has reopened and again is a recurrent issue for him. Subsequently he does wear his Velcro compression wraps though I think we may want to look into something a little bit better for him I am thinking that we may want to try the juxta fit compression which I think would do much more effective compression treatment overall for him. 04/08/2021 upon evaluation today patient appears to be doing well with regard to his legs. I do feel like he is showing signs of improvement which is great news. No fevers, chills, nausea, vomiting, or diarrhea. 04/15/2021 upon evaluation today patient's wounds actually appear to be very close to complete resolution. He is legs are  actually doing well left leg just has a small area of leaking at this point. Overall I think that we are very close to complete resolution based on what I am seeing. Nonetheless he seems to not be feeling too well today I really do not know what exactly is going on here. 04/22/2021 upon evaluation patient appears to be completely healed based on what I see today. I do not see anything open both legs appear to be doing quite well. 05/20/2021 upon evaluation today patient appears to be doing poorly in regard to his legs. He has not been a full month since we last saw him and he has reopened unfortunately. He does not appear to be any signs of active infection at this time which is great news. No fevers, chills, nausea, vomiting, or diarrhea. Electronic Signature(s) Signed: 05/20/2021 1:19:16 PM By: Worthy Keeler PA-C Entered By: Worthy Keeler on  05/20/2021 13:19:15 Adam Merritt ([id number]) -------------------------------------------------------------------------------- Physical Exam Details Patient Name: Adam Merritt Date of Service: 05/20/2021 12:30 PM Medical Record Number: [id number] Patient Account Number: [id number] Date of Birth/Sex: Jul 29, 1945 (75 y.o. M) Treating RN: Carlene Coria Primary Care Provider: [name] Other Clinician: Referring Provider: [name] Treating Provider/Extender: Jeri Cos [name]: 8 Constitutional Well-nourished and well-hydrated in no acute distress. Respiratory normal breathing without difficulty. Psychiatric this patient is able to make decisions and demonstrates good insight into disease process. Alert and Oriented x 3. pleasant and cooperative. Notes Upon inspection patient has several areas of open weeping noted of the bilateral lower extremities. There does not appear to be any signs of infection which is good news but we do need to get this under control as far as the edema is concerned. Electronic Signature(s) Signed: 05/20/2021 1:19:46 PM By: Worthy Keeler PA-C Entered By: Worthy Keeler on 05/20/2021 13:19:46 Adam Merritt ([id number]) -------------------------------------------------------------------------------- Physician Orders Details Patient Name: Adam Merritt Date of Service: 05/20/2021 12:30 PM Medical Record Number: [id number] Patient Account Number: [id number] Date of Birth/Sex: 1946/05/07 (75 y.o. M) Treating RN: Carlene Coria Primary Care Provider: [name] Other Clinician: Referring Provider: [name] Treating Provider/Extender: Skipper Cliche in Treatment: 8 Verbal / Phone Orders: No Diagnosis Coding ICD-10 Coding Code Description I89.0 Lymphedema, not elsewhere classified I87.2 Venous insufficiency (chronic) (peripheral) L97.812 Non-pressure chronic ulcer of other part of right lower leg with fat layer exposed L97.822  Non-pressure chronic ulcer of other part of left lower leg with fat layer exposed Follow-up Appointments o Return Appointment in 1 week. Bathing/ Shower/ Hygiene o May shower with wound dressing protected with water repellent cover or cast protector. Edema Control - Lymphedema / Segmental Compressive Device / Other o Optional: One layer of unna paste to top of compression wrap (to act as an anchor). o 4 Layer Compression System Lymphedema. - bi lat o Elevate, Exercise Daily and Avoid Standing for Long Periods of Time. o Elevate legs to the level of the heart and pump ankles as often as possible o Elevate leg(s) parallel to the floor when sitting. Electronic Signature(s) Signed: 05/20/2021 4:49:51 PM By: Carlene Coria RN Signed: 05/20/2021 5:41:04 PM By: Worthy Keeler PA-C Entered By: Carlene Coria on 05/20/2021 13:33:37 Adam Merritt ([id number]) -------------------------------------------------------------------------------- Problem List Details Patient Name: Adam Merritt Date of Service: 05/20/2021 12:30 PM Medical Record Number: [id number] Patient Account Number: [id number] Date of Birth/Sex: January 15, 1946 (75 y.o. M) Treating RN: Carlene Coria Primary Care Provider: [name] Other Clinician: Referring Provider: [name] Treating  Provider/Extender: Jeri Cos [name]: 8 Active Problems ICD-10 Encounter Code Description Active Date MDM Diagnosis I89.0 Lymphedema, not elsewhere classified 03/25/2021 No Yes I87.2 Venous insufficiency (chronic) (peripheral) 03/25/2021 No Yes L97.812 Non-pressure chronic ulcer of other part of right lower leg with fat layer 03/25/2021 No Yes exposed L97.822 Non-pressure chronic ulcer of other part of left lower leg with fat layer 03/25/2021 No Yes exposed Inactive Problems Resolved Problems Electronic Signature(s) Signed: 05/20/2021 1:00:08 PM By: Worthy Keeler PA-C Entered By: Worthy Keeler on 05/20/2021  13:00:08 Adam Merritt ([id number]) -------------------------------------------------------------------------------- Progress Note Details Patient Name: Adam Merritt Date of Service: 05/20/2021 12:30 PM Medical Record Number: [id number] Patient Account Number: [id number] Date of Birth/Sex: 09/20/1945 (75 y.o. M) Treating RN: Carlene Coria Primary Care Provider: [name] Other Clinician: Referring Provider: [name] Treating Provider/Extender: Skipper Cliche in Treatment: 8 Subjective Chief Complaint Information obtained from Patient Bilateral LE edema and weeping ulcers History of Present Illness (HPI) 10/18/17-He is here for initial evaluation of bilateral lower extremity ulcerations in the presence of venous insufficiency and lymphedema. He has been seen by vascular medicine in the past, Dr. Lucky Cowboy, last seen in 2016. He does have a history of abnormal ABIs, which is to be expected given his lymphedema and venous insufficiency. According to Epic, it appears that all attempts for arterial evaluation and/or angiography were not follow through with by patient. He does have a history of being seen in lymphedema clinic in 2018, stopped going approximately 6 months ago stating "it didn't do any good". He does not have lymphedema pumps, he does not have custom fit compression wrap/stockings. He is diabetic and his recent A1c last month was 7.6. He admits to chronic bilateral lower extremity pain, no change in pain since blister and ulceration development. He is currently being treated with Levaquin for bronchitis. He has home health and we will continue. 10/25/17-He is here in follow-up evaluation for bilateral lower extremity ulcerationssubtle he remains on Levaquin for bronchitis. Right lower extremity with no evidence of drainage or ulceration, persistent left lower extremity ulceration. He states that home health has not been out since his appointment. He went to [facility] on Tuesday, studies revealed: RIGHT ABI 0.9, TBI 0.6 LEFT ABI 1.1, TBI 0.6 with triphasic flow bilaterally. We will continue his same treatment plan. He has been educated on compression therapy and need for elevation. He will benefit from lymphedema pumps 11/01/17-He is here in follow-up evaluation for left lower extremity ulcer. The right lower extremity remains healed. He has home health services, but they have not been out to see the patient for 2-3 weeks. He states it home health physical therapy changed his dressing yesterday after therapy; he placed Ace wrap compression. We are still waiting for lymphedema pumps, reordered d/t need for company change. 11/08/17-He is here in follow-up evaluation for left lower extremity ulcer. It is improved. Edema is significantly improved with compression therapy. We will continue with same treatment plan and he will follow-up next week. No word regarding lymphedema pumps 11/15/17-He is here in follow-up evaluation for left lower extremity ulcer. He is healed and will be discharged from wound care services. I have reached out to [facility] regarding his lymphedema pumps. They have been unable to reach the patient; the contact number they had with the patient's wife's cell phone and she has not answered any unrecognized calls. Contact should be made today, trial planned for next week; [facility] will continue to  follow 11/27/17 on evaluation today patient has multiple blistered areas over the right lower extremity his left lower extremity appears to be doing okay. These blistered areas show signs of no infection which is great news. With that being said he did have some necrotic skin overlying which was mechanically debrided away with saline and gauze today without complication. Overall post debridement the wounds appear to be doing better but in general his swelling seems to be increased. This is obviously not good news. I think this is what  has given rise to the blisters. 12/04/17 on evaluation today patient presents for follow-up concerning his bilateral lower extremity edema in the right lower extremity ulcers. He has been tolerating the dressing changes without complication. With that being said he has had no real issues with the wraps which is also good news. Overall I'm pleased with the progress he's been making. 12/11/17 on evaluation today patient appears to be doing rather well in regard to his right lateral lower extremity ulcer. He's been tolerating the dressing changes without complication. Fortunately there does not appear to be any evidence of infection at this time. Overall I'm pleased with the progress that is being made. Unfortunately he has been in the hospital due to having what sounds to be a stomach virus/flu fortunately that is starting to get better. 12/18/17 on evaluation today patient actually appears to be doing very well in regard to his bilateral lower extremities the swelling is under fairly good control his lymphedema pumps are still not up and running quite yet. With that being said he does have several areas of opening noted as far as wounds are concerned mainly over the left lower extremity. With that being said I do believe once he gets lymphedema pumps this would least help you mention some the fluid and preventing this from occurring. Hopefully that will be set up soon sleeves are Artie in place at his home he just waiting for the machine. 12/25/17 on evaluation today patient actually appears to be doing excellent in fact all of his ulcers appear to have resolved his legs appear very well. I do think he needs compression stockings we have discussed this and they are actually going to go to [place] today to elastic therapy to get this fitted for him. I think that is definitely a good thing to do. Readmission: 04/09/18 upon evaluation today this patient is seen for readmission due to bilateral lower extremity  lymphedema. He has significant swelling of his extremities especially on the left although the right is also swollen he has weeping from both sides. There are no obvious open wounds at this point. Fortunately he has been doing fairly well for quite a bit of time since I last saw him. Nonetheless unfortunately this seems to have reopened and is giving quite a bit of trouble. He states this began about a week ago when he first called [place] to get in to be seen. No fevers, chills, nausea, or vomiting noted at this time. He has not been using his lymphedema pumps due to the fact that they won't fit on his leg at this point likewise is also not been using his compression for essentially the same reason. 04/16/18 upon evaluation today patient actually appears to be doing a little better in regard to the fluid in his bilateral lower extremities. With that being said he's had three falls since I saw him last week. He also states that he's been feeling very poorly. I was concerned last week  and feel like that the concern is still there as far as the congestion in his chest is concerned he seems to be breathing about the same as last week but again he states he's very weak he's not even able to walk further than from the chair to the door. His wife had to buy a wheelchair just to be able to get them out of the house to get to the appointment today. This has me very concerned. Adam Merritt, Adam Merritt ([id number]) 04/23/18 on evaluation today patient actually appears to be doing much better than last week's evaluation. At that time actually had to transport him to the ER via EMS and he subsequently was admitted for acute pulmonary edema, acute renal failure, and acute congestive heart failure. Fortunately he is doing much better. Apparently they did dialyze him and were able to take off roughly 35 pounds of fluid. Nonetheless he is feeling much better both in regard to his breathing and he's able to get around much better at  this time compared to previous. Overall I'm very happy with how things are at this time. There does not appear to be any evidence of infection currently. No fevers, chills, nausea, or vomiting noted at this time. 04/30/2018 patient seen today for follow-up and management of bilateral lower extremity lymphedema. He did express being more sad today than usual due to the recent loss of his dog. He states that he has been compliant with using the lymphedema pumps. However he does admit a minute over the last 2-3 days he has not been using the pumps due to the recent loss of his dog. At this time there is no drainage or open wounds to his lower extremities. The left leg edema is measuring smaller today. Still has a significant amount of edema on bilateral lower extremities With dry flaky skin. He will be referred to the lymphedema clinic for further management. Will continue 3 layer compression wraps and follow-up in 1-2 weeks.Denies any pain, fever, chairs recently. No recent falls or injuries reported during this visit. 05/07/18 on evaluation today patient actually appears to be doing very well in regard to his lower extremities in general all things considered. With that being said he is having some pain in the legs just due to the amount of swelling. He does have an area where he had a blister on the left lateral lower extremity this is open at this point other than that there's nothing else weeping at this time. 05/14/18 on evaluation today patient actually appears to be doing excellent all things considered in regard to his lower extremities. He still has a couple areas of weeping on each leg which has continued to be the issue for him. He does have an appointment with the lymphedema clinic although this isn't until February 2020. That was the earliest they had. In the meantime he has continued to tolerate the compression wraps without complication. 05/28/18 on evaluation today patient actually  appears to be doing more poorly in regard to his left lower extremity where he has a wound open at this point. He also had a fall where he subsequently injured his right great toe which has led to an open wound at the site unfortunately. He has been tolerating the dressing changes without complication in general as far as the wraps are concerned that he has not been putting any dressing on the left 1st toe ulcer site. 06/11/18 on evaluation today patient appears to be doing much worse in regard to  his bilateral lower extremity ulcers. He has been tolerating the dressing changes without complication although his legs have not been wrapped more recently. Overall I am not very pleased with the way his legs appear. I do believe he needs to be back in compression wraps he still has not received his compression wraps from the [facility] hospital as of yet. 06/18/18 on evaluation today patient actually appears to be doing significantly better than last time I saw him. He has been tolerating the compression wraps without complication in the circumferential ulcers especially appear to be doing much better. His toe ulcer on the right in regard to the great toe is better although not as good as the legs in my pinion. No fevers chills noted 07/02/18 on evaluation today patient appears to be doing much better in regard to his lower extremity ulcers. Unfortunately since I last saw him he's had the distal portion of his right great toe if he dated it sounds as if this actually went downhill very quickly. I had only seen him a few days prior and the toe did not appear to be infected at that point subsequently became infected very rapidly and it was decided by the surgeon that the distal portion of the toe needed to be removed. The patient seems to be doing well in this regard he tells me. With that being said his lower extremities are doing better from the standpoint of the wounds although he is significantly swollen at this  point. 07/09/18 on evaluation today patient appears to be doing better in regard to the wounds on his lower extremities. In fact everything is almost completely healed he is just a small area on the left posterior lower extremity that is open at this point. 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 With that being said he needs some kind of compression it does sound like he may not be wearing his compression, that is the wraps, during the entire time between when he's here visit to visit. Apparently his wife took the current one off because it began to "fall apart". 07/16/18 on evaluation today patient appears to be extremely swollen especially in regard to his left lower extremity unfortunately. He also has a new skin tear over the left lower extremity and there's a smaller area on the right lower extremity as well. Unfortunately this seems to be due in part to blistering and fluid buildup in his leg. He did get the reduction wraps that were ordered by the [facility] hospital for him to go to lymphedema clinic. With that being said his wounds on the legs have not healed to the point to where they would likely accept them as a patient lymphedema clinic currently. We need to try to get this to heal. With that being said he's been taking his wraps off which is not doing him any favors at this point. In fact this is probably quite counterproductive compared to what needs to occur. We will likely need to increase to a four layer compression wrap and continue to also utilize elevation and he has to keep the wraps on not take them off as he's been doing currently hasn't had a wrap on since Saturday. 07/23/18 on evaluation today patient appears to be doing much better in regard to his bilateral lower extremities. In fact his left  lower  extremity which was the largest is actually 15 cm smaller today compared to what it was last time he was here in our clinic. This is obviously good news after just one week. Nonetheless the differences he actually kept the wraps on during the entire week this time. That's not typical for him. I do believe he understands a little bit better now the severity of the situation and why it's important for him to keep these wraps on. 07/30/18 on evaluation today patient actually appears to be doing rather well in regard to his lower extremities. His legs are much smaller than they have been in the past and he actually has only one very small rudder superficial region remaining that is not closed on the left lateral/posterior lower extremity even this is almost completely close. I do believe likely next week he will be healed without any complications. I do think we need to continue the wraps however this seems to be beneficial for him. I also think it may be a good time for [place] to go ahead and see about getting the appointment with the lymphedema clinic which is supposed to be made for him in order to keep this moving along and hopefully get them into compression wraps that will in the end help him to remain healed. 08/06/18 on evaluation today patient actually appears to be doing very well in regard as bilateral lower extremities. In fact his wounds appear to be completely healed at this time. 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 He is in the process of getting appointment with the lymphedema clinic we have made this referral were just waiting to hear back on the schedule time. We need to follow up on that today as well. 08/13/18 on evaluation today patient actually appears to be doing very well in regard to his bilateral lower extremities there are no open wounds at this point. We are gonna go ahead and see about ordering the Velcro compression  wraps for him had a discussion with them about [place] doing it versus the [place] they feel like they can definitely afford going ahead and get the wraps themselves and they would prefer to try to avoid having to go to the lymphedema clinic if it all possible which I completely understand. As long as he has good compression I'm okay either way. NIEKO, Adam Merritt ([id number]) 3/17//20 on evaluation today patient actually appears to be doing well in regard to his bilateral lower extremity ulcers. He has been tolerating the dressing changes without complication specifically the compression wraps. Overall is had no issues my fingers finding that I see at this point is that he is having trouble with constipation. He tells me he has not been able to go to the bathroom for about six days. He's taken to over-the- counter oral laxatives unfortunately this is not helping. He has not contacted his doctor. 08/27/18 on evaluation today patient appears to be doing fairly well in regard to his lower extremities at this point. There does not appear to be any new altars is swelling is very well controlled. We are still waiting for his Velcro compression wraps to arrive that should be sometime in the next week is Artie sent the check for these. 09/03/18 on evaluation today patient appears to be doing excellent in regard to his bilateral lower extremities which he shows no signs of wound openings in regard to this point. He does have his Velcro compression  wraps which did arrive in the mail since I saw him last week. Overall he is doing excellent in my pinion. 09/13/18 on evaluation today patient appears to be doing very well currently in regard to the overall appearance of his bilateral lower extremities although he's a little bit more swollen than last time we saw him. At that point he been discharged without any open wounds. Nonetheless he has a small open wound on the posterior left lower extremity with some evidence of  cellulitis noted as well. Fortunately I feel like he has made good progress overall with regard to his lower extremities from were things used to be. 09/20/18 on evaluation today patient actually appears to be doing much better. The erythematous lower extremity is improving wound itself which is still open appears to be doing much better as far as but appearance as well as pain is concerned overall very pleased in this regard. There's no signs of active infection at this time. 09/27/18 on evaluation today patient's wounds on the lower extremity actually appear to be doing fairly well at this time which is good news. There is no evidence of active infection currently and again is just as left lower extremity were there any wounds at this point anyway. I believe they may be completely healed but again I'm not 100% sure based on evaluation today. I think one more week of observation would likely be a good idea. 10/04/18 on evaluation today patient actually appears to be doing excellent in regard to the left lower extremity which actually appears to be completely healed as of today. Unfortunately he's been having issues with his right lower extremity have a new wound that has opened. Fortunately there's no evidence of active infection at this time which is good news. 10/10/18 upon evaluation today patient actually appears to be doing a little bit worse with the new open area on his right posterior lower extremity. He's been tolerating the dressing changes without complication. Right now we been using his compression wraps although I think we may need to switch back to actually performing bilateral compression wraps are in the clinic. No fevers, chills, nausea, or vomiting noted at this time. 10/17/18 on evaluation today patient actually appears to be doing quite well in regard to his bilateral lower extremity ulcers. He has been tolerating the reps without complication although he would prefer not to rewrap his  legs as of today. Fortunately there's no signs of active infection which is good news. No fevers, chills, nausea, or vomiting noted at this time. 10/24/18 on evaluation today patient appears to be doing very well in regard to his lower extremities. His right lower extremity is shown signs of healing and his left lower Trinity though not healed appears to be improving which is excellent news. Overall very pleased with how things seem to be progressing at this time. The patient is likewise happy to hear this. His Velcro compression wraps however have not been put on properly will gonna show his wife how to do that properly today. 10/31/18 on evaluation today patient appears to be doing more poorly in regard to his left lower extremity in particular although both lower extremities actually are showing some signs of being worse than my previous evaluation. Unfortunately I'm just not sure that his compression stockings even with the use of the compression/lymphedema pumps seem to be controlling this well. Upon further questioning he tells me that he also is not able to lie flat in the bed due to  his congestive heart failure and difficulty breathing. For that reason he sleeps in his recliner. To make matters worse his recliner also cannot even hold his legs up so instead of even being somewhat elevated they pretty much hang down to the floor. This is the way he sleeps each night which is definitely counterproductive to everything else that were attempting to do from the standpoint of controlling his fluid. Nonetheless I think that he potentially could benefit from a hospital bed although this would be something that his primary care provider would likely have to order since anything that is order on our side has to be directly related to wound care and again the hospital bed is not necessarily a direct relation to although I think it does contribute to his overall wound status on his lower extremities. 11/07/18  on evaluation today patient appears to be doing better in regard to his bilateral lower extremity. He's been tolerating the dressing changes without complication. We did in the interim since I last saw him switch to just using extras orbiting new alginate and that seems to have done much better for him. I'm very pleased with the overall progress that is made. 11/14/18 on evaluation today patient appears to be redoing rather well in regard to his left lower extremity ulcers which are the only ones remaining at this point. Fortunately there's no signs of active infection at this time which is good news. Overall been very pleased with how things seem to be progressing currently. No fevers, chills, nausea, or vomiting noted at this time. 11/20/17 on evaluation today patient actually appears to be doing quite well in regard to his lower extremities on the left on the right he has several blisters that showed up although there's some question about whether or not he has had his broker compression wraps on like he was supposed to or not. Fortunately there's no signs of active infection at this time which is good news. Unfortunately though he is doing well from the standpoint of the left leg the right leg is not doing as well again this is when we did not wrap last week. 11/28/18 on evaluation today patient appears to be doing well in regard to his bilateral lower extremities is left appears to be healed is right is not healed but is very close to being so. Overall very pleased with how things seem to be progressing. Patient is likewise happy that things are doing well. 12/09/18 on evaluation today patient actually appears to be completely healed which is excellent news. He actually seems to be doing well in regard to the swelling of the bilateral lower extremities which is also great news. Overall very pleased with how things seem to be progressing. Readmission: 03/18/2019 upon evaluation today patient presents  for reevaluation concerning issues that he is having with his left lower extremity where he does have a wound noted upon inspection today. He also has a wound on the right second toe on the tip where it is apparently been rubbing in his shoe I did look at issues and you can actually see where this has been occurring as well. Fortunately there is no signs of infection with regard to Adam Merritt, Adam Merritt. ([id number]) the toe necessarily although it is very swollen compared to normal that does have me somewhat concerned about the possibility of further evaluation for infection/osteomyelitis. I would recommend an x-ray to start with. Otherwise he states that it is been 1-2 weeks that he has had the draining  in regard to left lower extremity he does not know how this happened he has been wearing his compression appropriately and I do see that as well today but nonetheless I do think that he needs to continue to be very cautious with regard to elevation as well. 03/25/2019 on evaluation today patient appears to be doing much better in regard to his lower extremity at this time. That is on the left. He did culture positive for Pseudomonas but the good news is he seems to be doing much better the gentamicin we applied topically seems to have done a great job for him. There is no signs of active infection at this time systemically and locally he is doing much better. No fevers, chills, nausea, vomiting, or diarrhea. In regard to his right toe this seems to be doing much better there is very little pressure noted at this point I did clean up some of the callus today around the edges of the wound as well as the surface of the wound but to be honest he is progressing quite nicely. 10/30; the area on the left anterior lower tibia area is healed over. His edema control is adequate even though his use of the compression pumps seems very intermittent. He has a juxta lite stocking on the right leg and a think there is 1 for  the left leg at home. His wife is putting these on. He has an area on the plantar right second toe which is a hammertoe they are trying to offload this 04/08/2019 on evaluation today patient appears to be doing well in regard to his lower extremities which is good news. We are seeing him today for his toe ulcer which is giving him a little bit more trouble but again still seems to be doing better at this point which is good news. He is going require some sharp debridement in regard to the toe today however. 04/15/2019 on evaluation today patient actually appears to be doing quite well with regard to his toe ulcer. I am very pleased with how things are doing in that regard. With regard to his lower extremity edema in general he tells me he is not been using the lymphedema pumps regularly although he does not use them. I think that he needs to use them more regularly he does have a small blister on the left lower extremity this is not open at this point but obviously it something that can get worse if he does not keep the compression going and the pumps going as well. 04/22/2019 on evaluation today patient appears to be doing well with regard to his toe ulcer. He has been tolerating the dressing changes without complication. This is measuring slightly smaller this week as compared to last week. Fortunately there is no signs of active infection at this time. 05/06/2019 on evaluation today patient actually appears to be doing excellent. In fact his toe appears to be completely healed which is great news. There is no signs of active infection at this time. Readmission: Patient presents today for follow-up after having been discharged at the beginning of December 2020. He states that in recent weeks things have reopened and has been having more trouble at this point. With that being said fortunately there is no signs of active infection at this time. No fever chills noted. Nonetheless he also does have an  issue with one of his toes as well that seems to be somewhat this is on the right foot second toe. 07/25/2019  upon evaluation today patient's lower extremities appear to be doing excellent bilaterally. I really feel like he is showing signs of great improvement and in fact I think he is close to healing which is great news. There is no signs of active infection at this time. No fevers, chills, nausea, vomiting, or diarrhea. 07/31/2019 upon evaluation today patient appears to be doing excellent and in fact I think may be completely healed in regard to his right lower extremity that were still to monitor this 1 more week before closing it out. The left lower extremity is measuring smaller although not completely closed seems to be doing excellent. 08/07/2019 upon evaluation today patient appears to be doing excellent in regard to his wounds. In fact the right lower extremity is completely healed there is no issues here. The left lower extremity has 1 very small area still remaining fortunately there is no signs of infection and overall I think he is very close to closure of the here as well. 08/14/19 upon evaluation today patient appears to be doing excellent in regard to his lower extremities. In fact he appears to be completely healed as of today. Fortunately there's no signs of active infection at this time. No fevers, chills, nausea, or vomiting noted at this time. READMISSION 09/26/2019 This is a 75 year old man who is well-known to this clinic having just been discharged on 08/14/2019. He has known lymphedema and chronic venous insufficiency. He has Farrow wrap stockings and also external compression pumps. He does not use the external compression pumps however. He does however apparently fairly faithfully use the stockings. They have not been lotion in his legs. He has developed new wounds on the right anterior as well as the left medial left lateral and left posterior calf. He returns for our review  of this Past medical history includes coronary artery disease, hypertension, congestive heart failure, COPD, venous insufficiency with lymphedema, type 2 diabetes. He apparently has compression pumps, Farrow wraps and at 1 point a hospital bed although I believe he sleeps in a recliner 10/03/2019 upon evaluation today patient appears to be doing better with regard to his wounds in general. He has been tolerating the dressing changes without complication. Fortunately there is no signs of active infection. No fevers, chills, nausea, vomiting, or diarrhea. I do believe the compression wraps are helping as they always have in the past. 10/10/2019 upon evaluation today patient appears to be doing excellent at this point. His right lower extremity is measuring much better and in fact is completely healed. His left lower extremity is also measuring better though not healed seems to be doing excellent at this point which is great news. Overall I am very pleased with how things appear currently. 10/27/2019 upon evaluation today patient appears to be doing quite well with regard to his wounds currently. He has been tolerating the dressing changes without complication. Fortunately there is no signs of active infection at this time. In fact he is almost completely healed on the left and has just a very small area open and on the right he is completely healed. 11/04/2019 upon evaluation today patient appears to be doing quite well with regard to his wounds. In fact he appears to be quite possibly completely healed on the left lower extremity now as well the right lower extremity is still doing well. With that being said unfortunately though this may be healed I think it is a very fragile area and I cannot even confirm there is not a very  small opening still remaining. I really think he may benefit from 1 additional weeks wrapping before discontinuing. Adam Merritt, Adam Merritt ([id number]) 11/10/2019 upon evaluation today patient  appears to be doing well in regard to his original wounds in fact everything that we were treating last week is completely healed on the left. Unfortunately he has a new skin tear just above where the wrap slipped down due to a fall he sustained causing this injury. 11/17/2019 on evaluation today patient appears to be doing better with regard to his wound. He has been tolerating the dressing changes without complication. Fortunately there is no signs of active infection at this time. No fevers, chills, nausea, vomiting, or diarrhea. 11/24/2019 upon evaluation today patient appears to be doing well with regard to his wound. This is making good progress there does not appear to be signs of active infection but overall I do feel like he is headed in the correct direction. Overall he is tolerating the compression wrap as well. 12/02/2019 upon evaluation today patient appears to be doing well with regard to his leg ulcer. In fact this appears to be completely healed and this is the last of the wounds that he had but still the left anterior lower extremity. Fortunately there is no signs of active infection at this time. No fevers, chills, nausea, vomiting, or diarrhea. Readmission: 02/05/2020 on evaluation today patient appears to be doing well with regard to his wound all things considered. He actually tells me that a couple weeks ago he had trouble with his wraps and therefore took him off for a couple of days in order to give his legs a break. It was during this time that he actually developed increased swelling and edema of his legs and blisters on both legs. That is when he called to make the appointment. Nonetheless since that time he began wearing his compression wraps which are Velcro in the meantime and has done extremely well with this. In fact his leg appears to be under good control as far as edema is concerned today it is nothing like what I am seeing when he has come in for evaluations in the  past. They have been using some of the silver alginate at home which has been beneficial for him. Fortunately there is no signs of active infection at this time. No fevers, chills, nausea, vomiting, or diarrhea. 02/19/2020 on evaluation today patient unfortunately does not appear to be doing quite as well as I would like to see. He has no signs of active infection at this time but unfortunately he is not doing well in regard to the overall appearance of his left leg. He has a couple other areas that are weeping now and the leg is much more swollen. I think that he needs to actually have a compression wrap to try to manage this. 02/26/2020 on evaluation today patient appears to be doing well with regard to his left lower extremity ulcer. Fortunately the swelling is down I do believe the pressure wrap seems to be doing quite well. Fortunately there is no signs of active infection at this time. 03/05/2020 upon evaluation today patient appears to be doing excellent in regard to his legs at this point. Fortunately there is no signs of active infection which is great news. Overall he feels like he is completely healed which is great news. Readmission: 05/20/2020 upon evaluation today patient appears to be doing well with regard to his leg ulcer all things considered. He is actually coming back  to see [place] after having had a reopening of the ulcers on his left leg in the past several weeks. He is out of dressing supplies and his wife is been try to take care of this as best she can. 06/10/2020 upon evaluation today patient unfortunately appears to be doing significantly worse today compared to when I last saw him. He and his wife both tell me that "there has been a lot going on". I did not actually go into detail about everything that has been happening but nonetheless he has not obviously been wearing his compression. He brings them with him today but they were not on. I am not even certain to be honest that he  could get those on at this point. Nonetheless I do believe that he is going to require compression wrapping at some point shortly but right now we need to try to get what appears to be infection under control at this time. 06/28/2020 upon evaluation today patient appears to be doing well with regard to 06/28/2020 upon evaluation today patient appears to be doing well pretty well in regard to his left leg which is much better than previous. With that being said in regard to his right leg he does seem to be having some issues with a new wound after he sustained a fall. This fortunately is not too deep but does appear to be something we need to address. Neither deep which is good. 07/05/2020 on evaluation today patient appears to be doing well with regard to his wounds. In fact everything on the left leg appears to be pretty much healed on the right leg this is very close though not completely closed yet. Fortunately there is no evidence of active infection at this time. No fevers, chills, nausea, vomiting, or diarrhea. 07/13/2019 upon evaluation today patient appears to be making good progress which is great news. Overall I am extremely pleased with where things stand today. There does not appear to be any signs of active infection which is great news and overall his left leg appears healed still the right leg though not healed does seem to be making good progress which is excellent news. He is having no pain. 07/19/2020 on evaluation today patient appears to be doing well with regard to his wound. He has been tolerating the dressing changes without complication. Fortunately there does not appear to be any signs of active infection at this time. No fevers, chills, nausea, vomiting, or diarrhea. Readmission: 08/20/2020 on evaluation today patient presents for reevaluation here in the clinic concerning issues that he has been having with his bilateral lower extremities. Unfortunately this is an ongoing  reoccurring issue. He tells me that he is really not certain exactly what caused the issue although as I discussed this further with him it appears that he has been sitting for the most part and sleeping in his chair with his feet on the ground he is not really elevating at all in fact the chair does not even have a reclining function therefore it is basically just him sitting with his feet on the ground. I think this alone has probably led to his increased swelling he is also having more difficulty walking which means that he is pumping less blood flow through his legs anyway as far as the venous stasis is concerned. All in all I think this is leading to a downward spiral of him not being able to walk very well as his legs are so heavy, they begin to  weep, and overall he just does not seem to be doing as well as it was when I last saw him. 08/27/2020 upon evaluation today patient's legs actually are showing signs of improvement which is great news. There does not appear to be any evidence of infection which is also excellent news. I am extremely pleased with where things stand today. No fevers, chills, nausea, vomiting, or diarrhea. 09/03/20 upon evaluation today patient appears to be doing excellent in regard to his wounds currently. He has been tolerating the dressing changes without complication and overall I am extremely pleased with where things stand today. No fevers, chills, nausea, vomiting, or diarrhea. Overall he is healed on the right leg although the left leg not being completely healed still is doing significantly better. HYRUM, SHANEYFELT ([id number]) 09/10/2020/8/22 upon evaluation today patient appears to be doing well with regard to his wounds. He has been tolerating the dressing changes without complication. Fortunately he appears to be completely healed. I am very pleased in that regard. As is the patient his right leg is also doing well he did get a new recliner which is helping him keep  his feet off the ground which I think is helped he is down 2 cm on the left compared to last week so I think there is some definite improvement here. Readmission 12/15/20: Mr. Asah Lamay is a 75 year old male with a past medical history of lymphedema, insulin-dependent type 2 diabetes, COPD and congestive heart failure that presents to the clinic for increased drainage from his legs bilaterally, Left greater than right. Patient has lymphedema. He has been seen in our clinic on multiple occasions for this issue. He currently denies signs of infection. He has not been using his lymphedema pumps. 12/23/2020 upon evaluation today patient appears to be doing well with regard to his wounds. He has been tolerating the dressing changes he really does not have any open wounds just weeping areas of the legs bilaterally and fortunately seems to be doing better at this point. 12/28/2020 upon evaluation today patient's wound is actually showing signs of good improvement. There does not appear to be any evidence of infection which is great news and overall I am extremely pleased at this point. I think is very close to complete resolution. 01/04/2021 upon evaluation today patient actually appears to be completely healed which is great news. I do not see any signs of active infection currently which is also great news. In general I am extremely pleased with where we stand and I do believe that the patient is tolerating the dressing changes without complication. I believe he is ready to go back into his Velcro compression and I did see that they brought them with him today. Readmission: Patient presents today for follow-up he was last seen August 2. He is having some issues with some mild drainage and leaking from the left leg. This is something that has reopened and again is a recurrent issue for him. Subsequently he does wear his Velcro compression wraps though I think we may want to look into something a little bit  better for him I am thinking that we may want to try the juxta fit compression which I think would do much more effective compression treatment overall for him. 04/08/2021 upon evaluation today patient appears to be doing well with regard to his legs. I do feel like he is showing signs of improvement which is great news. No fevers, chills, nausea, vomiting, or diarrhea. 04/15/2021 upon evaluation today patient's  wounds actually appear to be very close to complete resolution. He is legs are actually doing well left leg just has a small area of leaking at this point. Overall I think that we are very close to complete resolution based on what I am seeing. Nonetheless he seems to not be feeling too well today I really do not know what exactly is going on here. 04/22/2021 upon evaluation patient appears to be completely healed based on what I see today. I do not see anything open both legs appear to be doing quite well. 05/20/2021 upon evaluation today patient appears to be doing poorly in regard to his legs. He has not been a full month since we last saw him and he has reopened unfortunately. He does not appear to be any signs of active infection at this time which is great news. No fevers, chills, nausea, vomiting, or diarrhea. Objective Constitutional Well-nourished and well-hydrated in no acute distress. Vitals Time Taken: 12:42 PM, Height: 66 in, Weight: 300 lbs, BMI: 48.4, Temperature: 97.8 F, Pulse: 61 bpm, Respiratory Rate: 18 breaths/min, Blood Pressure: 117/69 mmHg. Respiratory normal breathing without difficulty. Psychiatric this patient is able to make decisions and demonstrates good insight into disease process. Alert and Oriented x 3. pleasant and cooperative. General Notes: Upon inspection patient has several areas of open weeping noted of the bilateral lower extremities. There does not appear to be any signs of infection which is good news but we do need to get this under control as  far as the edema is concerned. Other Condition(s) Patient presents with Lymphedema located on the Right Leg. Patient presents with Lymphedema located on the Left Leg. Adam Merritt, Adam Merritt ([id number]) Assessment Active Problems ICD-10 Lymphedema, not elsewhere classified Venous insufficiency (chronic) (peripheral) Non-pressure chronic ulcer of other part of right lower leg with fat layer exposed Non-pressure chronic ulcer of other part of left lower leg with fat layer exposed Plan 1. Would recommend currently that going continue with the wound care measures as before the patient is in agreement with that plan. This includes the Zetuvit absorptive dressings to help cut back on the amount of fluid buildup here. 2. I would recommend as well that we go ahead and initiate treatment with a 4-layer compression wraps that have done well for him in the past. We will see patient back for reevaluation in 1 week here in the clinic. If anything worsens or changes patient will contact our office for additional recommendations. Electronic Signature(s) Signed: 05/20/2021 1:20:28 PM By: Worthy Keeler PA-C Entered By: Worthy Keeler on 05/20/2021 13:20:27 Adam Merritt ([id number]) -------------------------------------------------------------------------------- SuperBill Details Patient Name: Adam Merritt Date of Service: 05/20/2021 Medical Record Number: [id number] Patient Account Number: [id number] Date of Birth/Sex: [birth year] (75 y.o. M) Treating RN: Carlene Coria Primary Care Provider: [name] Other Clinician: Referring Provider: [name] Treating Provider/Extender: Skipper Cliche in Treatment: 8 Diagnosis Coding ICD-10 Codes Code Description I89.0 Lymphedema, not elsewhere classified I87.2 Venous insufficiency (chronic) (peripheral) L97.812 Non-pressure chronic ulcer of other part of right lower leg with fat layer exposed L97.822 Non-pressure chronic ulcer of other part of left  lower leg with fat layer exposed Facility Procedures CPT4: Description Modifier Quantity Code [id number] BILATERAL: Application of multi-layer venous compression system; leg (below knee), including 1 ankle and foot. Physician Procedures CPT4 Code: [id number] Description: 79480 - WC PHYS LEVEL 4 - EST PT Modifier: Quantity: 1 CPT4 Code: Description: ICD-10 Diagnosis Description I89.0 Lymphedema, not elsewhere classified I87.2 Venous insufficiency (chronic) (  peripheral) L97.812 Non-pressure chronic ulcer of other part of right lower leg with fat la L97.822 Non-pressure chronic ulcer of  other part of left lower leg with fat lay Modifier: yer exposed er exposed Quantity: Electronic Signature(s) Signed: 05/20/2021 4:49:51 PM By: Carlene Coria RN Signed: 05/20/2021 5:41:04 PM By: Worthy Keeler PA-C Previous Signature: 05/20/2021 1:20:42 PM Version By: Worthy Keeler PA-C Entered By: Carlene Coria on 05/20/2021 13:32:28

## 2021-05-20 NOTE — Progress Notes (Signed)
Adam, Merritt ([id number]) Visit Report for 05/20/2021 Arrival Information Details Patient Name: Adam Merritt, Adam Merritt Date of Service: 05/20/2021 12:30 PM Medical Record Number: [id number] Patient Account Number: [id number] Date of Birth/Sex: Jan 06, 1946 (75 y.o. M) Treating RN: Carlene Coria Primary Care Kashmere Daywalt: [name] Other Clinician: Referring Shakia Sebastiano: [name] Treating Kushi Kun/Extender: Skipper Cliche in Treatment: 8 Visit Information History Since Last Visit All ordered tests and consults were completed: No Patient Arrived: Ambulatory Added or deleted any medications: No Arrival Time: 12:42 Any new allergies or adverse reactions: No Accompanied By: wife Had a fall or experienced change in No Transfer Assistance: None activities of daily living that may affect Patient Identification Verified: Yes risk of falls: Secondary Verification Process Completed: Yes Signs or symptoms of abuse/neglect since last visito No Patient Requires Transmission-Based Precautions: No Hospitalized since last visit: No Patient Has Alerts: No Implantable device outside of the clinic excluding No cellular tissue based products placed in the center since last visit: Has Dressing in Place as Prescribed: Yes Has Compression in Place as Prescribed: No Pain Present Now: No Electronic Signature(s) Signed: 05/20/2021 4:49:51 PM By: Carlene Coria RN Entered By: Carlene Coria on 05/20/2021 12:42:28 Adam Merritt ([id number]) -------------------------------------------------------------------------------- Clinic Level of Care Assessment Details Patient Name: Adam Merritt Date of Service: 05/20/2021 12:30 PM Medical Record Number: [id number] Patient Account Number: [id number] Date of Birth/Sex: 07-Jun-1945 (75 y.o. M) Treating RN: Carlene Coria Primary Care Richetta Cubillos: [name] Other Clinician: Referring Conn Trombetta: [name] Treating Ithiel Liebler/Extender: Skipper Cliche in Treatment: 8 Clinic  Level of Care Assessment Items TOOL 1 Quantity Score []  - Use when EandM and Procedure is performed on INITIAL visit 0 ASSESSMENTS - Nursing Assessment / Reassessment []  - General Physical Exam (combine w/ comprehensive assessment (listed just below) when performed on new 0 pt. evals) []  - 0 Comprehensive Assessment (HX, ROS, Risk Assessments, Wounds Hx, etc.) ASSESSMENTS - Wound and Skin Assessment / Reassessment []  - Dermatologic / Skin Assessment (not related to wound area) 0 ASSESSMENTS - Ostomy and/or Continence Assessment and Care []  - Incontinence Assessment and Management 0 []  - 0 Ostomy Care Assessment and Management (repouching, etc.) PROCESS - Coordination of Care []  - Simple Patient / Family Education for ongoing care 0 []  - 0 Complex (extensive) Patient / Family Education for ongoing care []  - 0 Staff obtains Programmer, systems, Records, Test Results / Process Orders []  - 0 Staff telephones HHA, Nursing Homes / Clarify orders / etc []  - 0 Routine Transfer to another Facility (non-emergent condition) []  - 0 Routine Hospital Admission (non-emergent condition) []  - 0 New Admissions / Biomedical engineer / Ordering NPWT, Apligraf, etc. []  - 0 Emergency Hospital Admission (emergent condition) PROCESS - Special Needs []  - Pediatric / Minor Patient Management 0 []  - 0 Isolation Patient Management []  - 0 Hearing / Language / Visual special needs []  - 0 Assessment of Community assistance (transportation, D/C planning, etc.) []  - 0 Additional assistance / Altered mentation []  - 0 Support Surface(s) Assessment (bed, cushion, seat, etc.) INTERVENTIONS - Miscellaneous []  - External ear exam 0 []  - 0 Patient Transfer (multiple staff / Civil Service fast streamer / Similar devices) []  - 0 Simple Staple / Suture removal (25 or less) []  - 0 Complex Staple / Suture removal (26 or more) []  - 0 Hypo/Hyperglycemic Management (do not check if billed separately) []  - 0 Ankle / Brachial Index  (ABI) - do not check if billed separately Has the patient been seen at the hospital within the last  three years: Yes Total Score: 0 Level Of Care: ____ Adam Merritt ([id number]) Electronic Signature(s) Signed: 05/20/2021 4:49:51 PM By: Carlene Coria RN Entered By: Carlene Coria on 05/20/2021 13:32:17 Adam Merritt ([id number]) -------------------------------------------------------------------------------- Compression Therapy Details Patient Name: Adam Merritt Date of Service: 05/20/2021 12:30 PM Medical Record Number: [id number] Patient Account Number: [id number] Date of Birth/Sex: Mar 16, 1946 (75 y.o. M) Treating RN: Carlene Coria Primary Care Subrina Vecchiarelli: [name] Other Clinician: Referring Jalessa Peyser: [name] Treating Sani Madariaga/Extender: Skipper Cliche in Treatment: 8 Compression Therapy Performed for Wound Assessment: NonWound Condition Lymphedema - Right Leg Performed By: Clinician Carlene Coria, RN Compression Type: Four Layer Post Procedure Diagnosis Same as Pre-procedure Electronic Signature(s) Signed: 05/20/2021 4:49:51 PM By: Carlene Coria RN Entered By: Carlene Coria on 05/20/2021 13:31:01 Adam Merritt ([id number]) -------------------------------------------------------------------------------- Compression Therapy Details Patient Name: Adam Merritt Date of Service: 05/20/2021 12:30 PM Medical Record Number: [id number] Patient Account Number: [id number] Date of Birth/Sex: April 14, 1946 (75 y.o. M) Treating RN: Carlene Coria Primary Care Tae Vonada: [name] Other Clinician: Referring Devonia Farro: [name] Treating Patricie Geeslin/Extender: Skipper Cliche in Treatment: 8 Compression Therapy Performed for Wound Assessment: NonWound Condition Lymphedema - Left Leg Performed By: Clinician Carlene Coria, RN Compression Type: Four Layer Post Procedure Diagnosis Same as Pre-procedure Electronic Signature(s) Signed: 05/20/2021 4:49:51 PM By: Carlene Coria RN Entered By:  Carlene Coria on 05/20/2021 13:31:15 Adam Merritt ([id number]) -------------------------------------------------------------------------------- Encounter Discharge Information Details Patient Name: Adam Merritt Date of Service: 05/20/2021 12:30 PM Medical Record Number: [id number] Patient Account Number: [id number] Date of Birth/Sex: 1946/03/24 (75 y.o. M) Treating RN: Carlene Coria Primary Care Caytlin Better: [name] Other Clinician: Referring Mikki Ziff: [name] Treating Jaidyn Kuhl/Extender: Skipper Cliche in Treatment: 8 Encounter Discharge Information Items Discharge Condition: Stable Ambulatory Status: Wheelchair Discharge Destination: Home Transportation: Private Auto Accompanied By: wife Schedule Follow-up Appointment: Yes Clinical Summary of Care: Patient Declined Electronic Signature(s) Signed: 05/20/2021 4:49:51 PM By: Carlene Coria RN Entered By: Carlene Coria on 05/20/2021 13:33:14 Adam Merritt ([id number]) -------------------------------------------------------------------------------- Lower Extremity Assessment Details Patient Name: Adam Merritt Date of Service: 05/20/2021 12:30 PM Medical Record Number: [id number] Patient Account Number: [id number] Date of Birth/Sex: 1946/02/23 (75 y.o. M) Treating RN: Carlene Coria Primary Care Jamelle Goldston: [name] Other Clinician: Referring Greggory Safranek: [name] Treating Theia Dezeeuw/Extender: Jeri Cos [name]: 8 Edema Assessment Assessed: [Left: No] [Right: No] Edema: [Left: Yes] [Right: Yes] Calf Left: Right: Point of Measurement: 46 cm From Medial Instep 54 cm 62 cm Ankle Left: Right: Point of Measurement: 10 cm From Medial Instep 33 cm 40 cm Knee To Floor Left: Right: From Medial Instep 46 cm 46 cm Electronic Signature(s) Signed: 05/20/2021 4:49:51 PM By: Carlene Coria RN Entered By: Carlene Coria on 05/20/2021 13:15:25 Adam Merritt  ([id number]) -------------------------------------------------------------------------------- Multi Wound Chart Details Patient Name: Adam Merritt Date of Service: 05/20/2021 12:30 PM Medical Record Number: [id number] Patient Account Number: [id number] Date of Birth/Sex: 16-Oct-1945 (75 y.o. M) Treating RN: Carlene Coria Primary Care Jorita Bohanon: [name] Other Clinician: Referring Ameenah Prosser: [name] Treating Kiernan Atkerson/Extender: Skipper Cliche in Treatment: 8 Vital Signs Height(in): 66 Pulse(bpm): 61 Weight(lbs): 300 Blood Pressure(mmHg): 117/69 Body Mass Index(BMI): 48 Temperature(F): 97.8 Respiratory Rate(breaths/min): 18 Wound Assessments Treatment Notes Electronic Signature(s) Signed: 05/20/2021 4:49:51 PM By: Carlene Coria RN Entered By: Carlene Coria on 05/20/2021 13:30:45 Adam Merritt ([id number]) -------------------------------------------------------------------------------- [place] Details Patient Name: Adam Merritt Date of Service: 05/20/2021 12:30 PM Medical Record Number: [id number] Patient Account Number: [id number] Date of Birth/Sex:  August 03, 1945 (75 y.o. M) Treating RN: Carlene Coria Primary Care Violanda Bobeck: [name] Other Clinician: Referring Dominick Morella: [name] Treating Shiquita Collignon/Extender: Jeri Cos [name]: 8 Active Inactive Electronic Signature(s) Signed: 05/20/2021 4:49:51 PM By: Carlene Coria RN Entered By: Carlene Coria on 05/20/2021 13:30:34 Adam Merritt ([id number]) -------------------------------------------------------------------------------- Non-Wound Condition Assessment Details Patient Name: Adam Merritt Date of Service: 05/20/2021 12:30 PM Medical Record Number: [id number] Patient Account Number: [id number] Date of Birth/Sex: [birth year] (75 y.o. M) Treating RN: Carlene Coria Primary Care Ozro Russett: [name] Other Clinician: Referring Damieon Armendariz: [name] Treating Zykerria Tanton/Extender: Jeri Cos [name]: 8 Non-Wound Condition: Condition: Lymphedema Location: Leg Side: Right Photos Electronic Signature(s) Signed: 05/20/2021 4:49:51 PM By: Carlene Coria RN Entered By: Carlene Coria on 05/20/2021 12:54:16 Adam Merritt ([id number]) -------------------------------------------------------------------------------- Non-Wound Condition Assessment Details Patient Name: Adam Merritt Date of Service: 05/20/2021 12:30 PM Medical Record Number: [id number] Patient Account Number: [id number] Date of Birth/Sex: August 19, 1945 (75 y.o. M) Treating RN: Carlene Coria Primary Care Cashawn Yanko: [name] Other Clinician: Referring Hodges Treiber: [name] Treating Harmonee Tozer/Extender: Jeri Cos [name]: 8 Non-Wound Condition: Condition: Lymphedema Location: Leg Side: Left Photos Electronic Signature(s) Signed: 05/20/2021 4:49:51 PM By: Carlene Coria RN Entered By: Carlene Coria on 05/20/2021 12:54:37 Adam Merritt ([id number]) -------------------------------------------------------------------------------- Pain Assessment Details Patient Name: Adam Merritt Date of Service: 05/20/2021 12:30 PM Medical Record Number: [id number] Patient Account Number: [id number] Date of Birth/Sex: [birth year] (75 y.o. M) Treating RN: Carlene Coria Primary Care Verda Mehta: [name] Other Clinician: Referring Karrissa Parchment: [name] Treating Keyla Milone/Extender: Skipper Cliche in Treatment: 8 Active Problems Location of Pain Severity and Description of Pain Patient Has Paino No Site Locations Pain Management and Medication Current Pain Management: Electronic Signature(s) Signed: 05/20/2021 4:49:51 PM By: Carlene Coria RN Entered By: Carlene Coria on 05/20/2021 12:42:59 Adam Merritt ([id number]) -------------------------------------------------------------------------------- Patient/Caregiver Education Details Patient Name: Adam Merritt Date of Service: 05/20/2021 12:30  PM Medical Record Number: [id number] Patient Account Number: [id number] Date of Birth/Gender: January 03, 1946 (75 y.o. M) Treating RN: Carlene Coria Primary Care Physician: [name] Other Clinician: Referring Physician: [name] Treating Physician/Extender: Skipper Cliche in Treatment: 8 Education Assessment Education Provided To: Patient Education Topics Provided Wound/Skin Impairment: Methods: Explain/Verbal Responses: State content correctly Electronic Signature(s) Signed: 05/20/2021 4:49:51 PM By: Carlene Coria RN Entered By: Carlene Coria on 05/20/2021 13:32:38 Adam Merritt ([id number]) -------------------------------------------------------------------------------- [place] Details Patient Name: Adam Merritt Date of Service: 05/20/2021 12:30 PM Medical Record Number: [id number] Patient Account Number: [id number] Date of Birth/Sex: [birth year] (75 y.o. M) Treating RN: Carlene Coria Primary Care Atilla Zollner: [name] Other Clinician: Referring Aariyah Sampey: [name] Treating Kaedence Connelly/Extender: Skipper Cliche in Treatment: 8 Vital Signs Time Taken: 12:42 Temperature (F): 97.8 Height (in): 66 Pulse (bpm): 61 Weight (lbs): 300 Respiratory Rate (breaths/min): 18 Body Mass Index (BMI): 48.4 Blood Pressure (mmHg): 117/69 Reference Range: 80 - 120 mg / dl Electronic Signature(s) Signed: 05/20/2021 4:49:51 PM By: Carlene Coria RN Entered By: Carlene Coria on 05/20/2021 12:42:51

## 2021-05-23 ENCOUNTER — Other Ambulatory Visit: Payer: Self-pay

## 2021-05-23 ENCOUNTER — Ambulatory Visit (INDEPENDENT_AMBULATORY_CARE_PROVIDER_SITE_OTHER): Payer: Medicare PPO | Admitting: Dermatology

## 2021-05-23 DIAGNOSIS — L578 Other skin changes due to chronic exposure to nonionizing radiation: Secondary | ICD-10-CM

## 2021-05-23 DIAGNOSIS — L820 Inflamed seborrheic keratosis: Secondary | ICD-10-CM | POA: Diagnosis not present

## 2021-05-23 DIAGNOSIS — L570 Actinic keratosis: Secondary | ICD-10-CM

## 2021-05-23 DIAGNOSIS — L219 Seborrheic dermatitis, unspecified: Secondary | ICD-10-CM | POA: Diagnosis not present

## 2021-05-23 MED ORDER — KETOCONAZOLE 2 % EX SHAM: 1.0000 "application " | MEDICATED_SHAMPOO | Freq: Once | CUTANEOUS | 2 refills | Status: AC

## 2021-05-23 MED ORDER — MOMETASONE FUROATE 0.1 % EX SOLN: CUTANEOUS | 1 refills | Status: DC

## 2021-05-23 NOTE — Progress Notes (Signed)
New Patient Visit  Subjective  Adam Merritt is a 75 y.o. male who presents for the following: Skin Problem (Patient here today for dry itchy scalp. Patient currently using Head & Shoulders. Patient also with a spot behind left ear, present for a few years. Patient does not have a hx of skin cancer, no hx of psoriasis.). Patient accompanied by wife who contributes to history.   Patient currently being treated for lymphedema by wound clinic.   The following portions of the chart were reviewed this encounter and updated as appropriate:   Tobacco   Allergies   Meds   Problems   Med Hx   Surg Hx   Fam Hx      Review of Systems:  No other skin or systemic complaints except as noted in HPI or Assessment and Plan.  Objective  Well appearing patient in no apparent distress; mood and affect are within normal limits.  A focused examination was performed including face, scalp. Relevant physical exam findings are noted in the Assessment and Plan.  Left Postauricular Erythematous stuck-on, waxy papule or plaque  Scalp Pink patches with greasy scale.   Right Lateral Nose Erythematous thin papules/macules with gritty scale.    Assessment & Plan  Inflamed seborrheic keratosis Left Postauricular Destruction of lesion - Left Postauricular Complexity: simple   Destruction method: cryotherapy   Informed consent: discussed and consent obtained   Timeout:  patient name, date of birth, surgical site, and procedure verified Lesion destroyed using liquid nitrogen: Yes   Region frozen until ice ball extended beyond lesion: Yes   Outcome: patient tolerated procedure well with no complications   Post-procedure details: wound care instructions given    Seborrheic dermatitis with pruritus Scalp Start ketoconazole 2% shampoo apply three times per week, massage into scalp and leave in for 10 minutes before rinsing out Start mometasone solution 3 x weekly, Monday, Wednesday and Friday, leave on over  night and wash out in the morning with ketoconazole shampoo.   Seborrheic Dermatitis  -  is a chronic persistent rash characterized by pinkness and scaling most commonly of the mid face but also can occur on the scalp (dandruff), ears; mid chest, mid back and groin.  It tends to be exacerbated by stress and cooler weather.  People who have neurologic disease may experience new onset or exacerbation of existing seborrheic dermatitis.  The condition is not curable but treatable and can be controlled.  mometasone (ELOCON) 0.1 % lotion - Scalp Apply three times weekly as directed. ketoconazole (NIZORAL) 2 % shampoo - Scalp Apply 1 application topically once for 1 dose. apply three times per week, massage into scalp and leave in for 10 minutes before rinsing out  AK (actinic keratosis) Right Lateral Nose Consider biopsy if not improved on follow up.   Destruction of lesion - Right Lateral Nose Complexity: simple   Destruction method: cryotherapy   Informed consent: discussed and consent obtained   Timeout:  patient name, date of birth, surgical site, and procedure verified Lesion destroyed using liquid nitrogen: Yes   Region frozen until ice ball extended beyond lesion: Yes   Outcome: patient tolerated procedure well with no complications   Post-procedure details: wound care instructions given    Actinic Damage - chronic, secondary to cumulative UV radiation exposure/sun exposure over time - diffuse scaly erythematous macules with underlying dyspigmentation - Recommend daily broad spectrum sunscreen SPF 30+ to sun-exposed areas, reapply every 2 hours as needed.  - Recommend staying in  the shade or wearing long sleeves, sun glasses (UVA+UVB protection) and wide brim hats (4-inch brim around the entire circumference of the hat). - Call for new or changing lesions.  Return in about 2 months (around 07/24/2021) for AK follow up.  Graciella Belton, RMA, am acting as scribe for Sarina Ser,  MD . Documentation: I have reviewed the above documentation for accuracy and completeness, and I agree with the above.  Sarina Ser, MD

## 2021-05-23 NOTE — Patient Instructions (Addendum)
Start ketoconazole 2% shampoo apply three times per week, massage into scalp and leave in for 10 minutes before rinsing out Start mometasone solution 3 x weekly, Monday, Wednesday and Friday, leave on over night and wash out in the morning with ketoconazole shampoo.   Cryotherapy Aftercare  Wash gently with soap and water everyday.   Apply Vaseline and Band-Aid daily until healed.   Prior to procedure, discussed risks of blister formation, small wound, skin dyspigmentation, or rare scar following cryotherapy. Recommend Vaseline ointment to treated areas while healing.  If You Need Anything After Your Visit  If you have any questions or concerns for your doctor, please call our main line at [phone] and press option 4 to reach your doctor's medical assistant. If no one answers, please leave a voicemail as directed and we will return your call as soon as possible. Messages left after 4 pm will be answered the following business day.   You may also send [place] a message via [place]. We typically respond to MyChart messages within 1-2 business days.  For prescription refills, please ask your pharmacy to contact our office. Our fax number is [phone].  If you have an urgent issue when the clinic is closed that cannot wait until the next business day, you can page your doctor at the number below.    Please note that while we do our best to be available for urgent issues outside of office hours, we are not available 24/7.   If you have an urgent issue and are unable to reach [place], you may choose to seek medical care at your doctor's office, retail clinic, urgent care center, or emergency room.  If you have a medical emergency, please immediately call 911 or go to the emergency department.  Pager Numbers  - Dr. Nehemiah Massed: [phone]  - Dr. Laurence Ferrari: [phone]  - Dr. Nicole Kindred: [phone]  In the event of inclement weather, please call our main line at [phone] for an update on the  status of any delays or closures.  Dermatology Medication Tips: Please keep the boxes that topical medications come in in order to help keep track of the instructions about where and how to use these. Pharmacies typically print the medication instructions only on the boxes and not directly on the medication tubes.   If your medication is too expensive, please contact our office at [phone] option 4 or send [place] a message through [place].   We are unable to tell what your co-pay for medications will be in advance as this is different depending on your insurance coverage. However, we may be able to find a substitute medication at lower cost or fill out paperwork to get insurance to cover a needed medication.   If a prior authorization is required to get your medication covered by your insurance company, please allow [place] 1-2 business days to complete this process.  Drug prices often vary depending on where the prescription is filled and some pharmacies may offer cheaper prices.  The website www.goodrx.com contains coupons for medications through different pharmacies. The prices here do not account for what the cost may be with help from insurance (it may be cheaper with your insurance), but the website can give you the price if you did not use any insurance.  - You can print the associated coupon and take it with your prescription to the pharmacy.  - You may also stop by our office during regular business hours and pick up a GoodRx coupon card.  -  If you need your prescription sent electronically to a different pharmacy, notify our office through [facility] or by phone at [phone] option 4.     Si Usted Necesita Algo Despus de Su Visita  Tambin puede enviarnos un mensaje a travs de Pharmacist, community. Por lo general respondemos a los mensajes de MyChart en el transcurso de 1 a 2 das hbiles.  Para renovar recetas, por favor pida a su farmacia que se ponga en contacto con nuestra oficina.  Harland Dingwall de fax es [place] [phone].  Si tiene un asunto urgente cuando la clnica est cerrada y que no puede esperar hasta el siguiente da hbil, puede llamar/localizar a su doctor(a) al nmero que aparece a continuacin.   Por favor, tenga en cuenta que aunque hacemos todo lo posible para estar disponibles para asuntos urgentes fuera del horario de [place], no estamos disponibles las 24 horas del da, los 7 das de la [place].   Si tiene un problema urgente y no puede comunicarse con nosotros, puede optar por buscar atencin mdica  en el consultorio de su doctor(a), en una clnica privada, en un centro de atencin urgente o en una sala de emergencias.  Si tiene Engineering geologist, por favor llame inmediatamente al 911 o vaya a la sala de emergencias.  Nmeros de bper  - Dr. Nehemiah Massed: [phone]  - Dra. Moye: [phone]  - Dra. Nicole Kindred: [phone]  En caso de inclemencias del [place], por favor llame a Johnsie Kindred principal al [phone] para una actualizacin sobre el [place] de cualquier retraso o cierre.  Consejos para la medicacin en dermatologa: Por favor, guarde las cajas en las que vienen los medicamentos de uso tpico para ayudarle a seguir las instrucciones sobre dnde y cmo usarlos. Las farmacias generalmente imprimen las instrucciones del medicamento slo en las cajas y no directamente en los tubos del [place].   Si su medicamento es muy caro, por favor, pngase en contacto con Zigmund Daniel llamando al [phone] y presione la opcin 4 o envenos un mensaje a travs de Pharmacist, community.   No podemos decirle cul ser su copago por los medicamentos por adelantado ya que esto es diferente dependiendo de la cobertura de su seguro. Sin embargo, es posible que podamos encontrar un medicamento sustituto a Electrical engineer un formulario para que el seguro cubra el medicamento que se considera necesario.   Si se requiere una autorizacin previa para que su  compaa de seguros [place] su medicamento, por favor permtanos de 1 a 2 das hbiles para completar este proceso.  Los precios de los medicamentos varan con frecuencia dependiendo del Environmental consultant de dnde se surte la receta y alguna farmacias pueden ofrecer precios ms baratos.  El sitio web www.goodrx.com tiene cupones para medicamentos de Airline pilot. Los precios aqu no tienen en cuenta lo que podra costar con la ayuda del seguro (puede ser ms barato con su seguro), pero el sitio web puede darle el precio si no utiliz Research scientist (physical sciences).  - Puede imprimir el cupn correspondiente y llevarlo con su receta a la farmacia.  - Tambin puede pasar por nuestra oficina durante el horario de atencin regular y Charity fundraiser una tarjeta de cupones de GoodRx.  - Si necesita que su receta se enve electrnicamente a una farmacia diferente, informe a nuestra oficina a travs de MyChart de [place] o por telfono llamando al [phone] y presione la opcin 4.

## 2021-05-24 ENCOUNTER — Encounter: Payer: Self-pay | Admitting: Nurse Practitioner

## 2021-05-24 ENCOUNTER — Ambulatory Visit (INDEPENDENT_AMBULATORY_CARE_PROVIDER_SITE_OTHER): Payer: Medicare PPO | Admitting: Nurse Practitioner

## 2021-05-24 VITALS — BP 164/76 | HR 60 | Temp 98.3°F | Resp 16 | Ht 66.0 in | Wt 300.0 lb

## 2021-05-24 DIAGNOSIS — R0602 Shortness of breath: Secondary | ICD-10-CM

## 2021-05-24 DIAGNOSIS — I10 Essential (primary) hypertension: Secondary | ICD-10-CM | POA: Diagnosis not present

## 2021-05-24 DIAGNOSIS — J449 Chronic obstructive pulmonary disease, unspecified: Secondary | ICD-10-CM | POA: Diagnosis not present

## 2021-05-24 NOTE — Progress Notes (Signed)
[facility] [place], [place] [address]  Internal MEDICINE  Office Visit Note  Patient Name: Adam Merritt  [id number]  [id number]  Date of Service: 05/24/2021  Chief Complaint  Patient presents with   Follow-up   COPD    HPI Adam Merritt presents for a follow up visit for COPD. He did spirometry in office today and it was slightly worse. He has not had a recent PFT. He uses flovent but often has bouts of bronchitis and intermittent wheezing.    Current Medication: Outpatient Encounter Medications as of 05/24/2021  Medication Sig   albuterol (VENTOLIN HFA) 108 (90 Base) MCG/ACT inhaler Inhale 2 puffs into the lungs every 6 (six) hours as needed for wheezing or shortness of breath.   aspirin 325 MG EC tablet Take 325 mg by mouth daily.   atorvastatin (LIPITOR) 40 MG tablet Take 40 mg by mouth at bedtime.    benzonatate (TESSALON) 100 MG capsule Take 1 capsule (100 mg total) by mouth 2 (two) times daily as needed for cough.   carvedilol (COREG) 25 MG tablet Take 25 mg by mouth 2 (two) times daily with a meal.   Cholecalciferol (VITAMIN D3 PO) Take by mouth.   Continuous Blood Gluc Sensor (FREESTYLE LIBRE 2 SENSOR) MISC 1 Device by Does not apply route every 14 (fourteen) days.   docusate sodium (COLACE) 50 MG capsule Take 50 mg by mouth 2 (two) times daily.   febuxostat (ULORIC) 40 MG tablet Take 1 tablet (40 mg total) by mouth daily.   fluticasone (FLOVENT HFA) 110 MCG/ACT inhaler Take 2 puffs in am and pm  for copd // chronic bronchitis ( rinse mouth)   furosemide (LASIX) 40 MG tablet TAKE 1 TABLET(40 MG) BY MOUTH DAILY   gabapentin (NEURONTIN) 300 MG capsule TAKE 2 CAPSULES BY MOUTH EVERY MORNING, 1 CAPSULE EVERY EVENING AND 2 CAPSULES AT NIGHT   glucose blood test strip Use as instructed to check blood sugars twice daily   hydrALAZINE (APRESOLINE) 25 MG tablet Take 25 mg by mouth 3 (three) times daily.    Insulin Glargine (BASAGLAR KWIKPEN) 100 UNIT/ML INJECT  30 TO 36 UNITS UNDER THE SKIN EVERY DAY   Insulin Pen Needle (BD PEN NEEDLE NANO U/F) 32G X 4 MM MISC Use as directed with insulin DX e11.65   ketotifen (ZADITOR) 0.025 % ophthalmic solution INSTILL 1 DROP IN BOTH EYES TWICE A DAY   mometasone (ELOCON) 0.1 % lotion Apply three times weekly as directed.   Multiple Vitamin (MULTIVITAMIN WITH MINERALS) TABS tablet Take 1 tablet by mouth daily.   ondansetron (ZOFRAN) 4 MG tablet Take 1 tablet (4 mg total) by mouth every 8 (eight) hours as needed.   OneTouch Delica Lancets 54Y MISC Use twice daily diag e11.65   pentoxifylline (TRENTAL) 400 MG CR tablet TAKE 1 TABLET BY MOUTH THREE TIMES DAILY FOR CLAUDICATION   potassium chloride SA (KLOR-CON) 20 MEQ tablet Take 1 tablet (20 mEq total) by mouth 2 (two) times daily.   prazosin (MINIPRESS) 1 MG capsule TAKE THREE CAPSULES BY MOUTH EVERY EVENING FOR NIGHTMARES   sennosides-docusate sodium (SENOKOT-S) 8.6-50 MG tablet Take 1-2 tablets by mouth 2 (two) times daily.   sertraline (ZOLOFT) 100 MG tablet Take 100 mg by mouth daily.    tiZANidine (ZANAFLEX) 2 MG tablet Take 1 tablet (2 mg total) by mouth 3 (three) times daily.   traZODone (DESYREL) 50 MG tablet Take 75 mg by mouth at bedtime.   triamcinolone cream (KENALOG)  0.1 % Apply 1 application topically 2 (two) times daily.   [DISCONTINUED] ipratropium-albuterol (DUONEB) 0.5-2.5 (3) MG/3ML SOLN Take 3 mLs by nebulization every 4 (four) hours as needed (for shortness of breath).   [DISCONTINUED] liraglutide (VICTOZA) 18 MG/3ML SOPN INJECT 1.8MG  INTO THE SKIN DAILY   No facility-administered encounter medications on file as of 05/24/2021.    Surgical History: Past Surgical History:  Procedure Laterality Date   AMPUTATION TOE Right 06/23/2018   Procedure: AMPUTATION TOE;  Surgeon: Sharlotte Alamo, DPM;  Location: [facility] ORS;  Service: Podiatry;  Laterality: Right;   CHOLECYSTECTOMY     CORONARY ARTERY BYPASS GRAFT      Medical History: Past Medical  History:  Diagnosis Date   Anemia    CAD (coronary artery disease)    Chest pain    Coronary artery disease    Diabetes mellitus without complication (HCC)    Diastolic CHF ([place])    Esophageal reflux    Herpes zoster without mention of complication    Hyperlipidemia    Hypertension    Insomnia    Lumbago    Lymphedema    Neuropathy in diabetes (HCC)    Obstructive chronic bronchitis without exacerbation (HCC)    Other malaise and fatigue    Stroke ([place])    Varicose veins     Family History: Family History  Problem Relation Age of Onset   Diabetes Mother    Hyperlipidemia Mother    Hypertension Mother    Diabetes Father    Hyperlipidemia Father    Hypertension Father    CAD Father     Social History   Socioeconomic History   Marital status: Married    Spouse name: Not on file   Number of children: Not on file   Years of education: Not on file   Highest education level: Not on file  Occupational History   Occupation: retired  Tobacco Use   Smoking status: Never   Smokeless tobacco: Never  Vaping Use   Vaping Use: Never used  Substance and Sexual Activity   Alcohol use: No    Comment: quit alcohol 30 years ago   Drug use: No   Sexual activity: Not Currently  Other Topics Concern   Not on file  Social History Narrative   Lives at home with wife, uses a wheelchair now   Social Determinants of Health   Financial Resource Strain: Not on file  Food Insecurity: Not on file  Transportation Needs: Not on file  Physical Activity: Not on file  Stress: Not on file  Social Connections: Not on file  Intimate Partner Violence: Not on file      Review of Systems  Constitutional:  Negative for activity change, appetite change, chills, fatigue, fever and unexpected weight change.  HENT: Negative.  Negative for congestion, ear pain, rhinorrhea, sore throat and trouble swallowing.   Eyes: Negative.   Respiratory:  Positive for cough, shortness of breath and  wheezing. Negative for chest tightness.   Cardiovascular: Negative.  Negative for chest pain.  Gastrointestinal: Negative.  Negative for abdominal pain, blood in stool, constipation, diarrhea, nausea and vomiting.  Endocrine: Negative.   Genitourinary: Negative.  Negative for difficulty urinating, dysuria, frequency, hematuria and urgency.  Musculoskeletal: Negative.  Negative for arthralgias, back pain, joint swelling, myalgias and neck pain.  Skin: Negative.  Negative for rash and wound.  Allergic/Immunologic: Negative.  Negative for immunocompromised state.  Neurological:  Positive for weakness. Negative for dizziness, seizures, numbness and headaches.  Hematological: Negative.   Psychiatric/Behavioral: Negative.  Negative for behavioral problems, self-injury, sleep disturbance and suicidal ideas. The patient is not nervous/anxious.    Vital Signs: BP (!) 164/76    Pulse 60    Temp 98.3 F (36.8 C)    Resp 16    Ht 5\' 6"  (1.676 m)    Wt 300 lb (136.1 kg)    SpO2 98%    BMI 48.42 kg/m    Physical Exam Vitals reviewed.  Constitutional:      General: He is not in acute distress.    Appearance: Normal appearance. He is well-developed. He is obese. He is not ill-appearing or diaphoretic.  HENT:     Head: Normocephalic and atraumatic.     Right Ear: Tympanic membrane, ear canal and external ear normal.     Left Ear: Tympanic membrane, ear canal and external ear normal.     Nose: Nose normal. No congestion or rhinorrhea.     Mouth/Throat:     Mouth: Mucous membranes are moist.     Pharynx: Oropharynx is clear. No oropharyngeal exudate or posterior oropharyngeal erythema.  Eyes:     General: No scleral icterus.       Right eye: No discharge.        Left eye: No discharge.     Conjunctiva/sclera: Conjunctivae normal.     Pupils: Pupils are equal, round, and reactive to light.  Neck:     Thyroid: No thyromegaly.     Vascular: No carotid bruit or JVD.     Trachea: No tracheal  deviation.  Cardiovascular:     Rate and Rhythm: Normal rate and regular rhythm.     Pulses: Normal pulses.     Heart sounds: Normal heart sounds. No murmur heard.   No friction rub. No gallop.  Pulmonary:     Effort: Pulmonary effort is normal. No accessory muscle usage or respiratory distress.     Breath sounds: Normal air entry. No stridor. Examination of the right-middle field reveals wheezing. Examination of the left-middle field reveals wheezing. Examination of the right-lower field reveals wheezing. Examination of the left-lower field reveals wheezing. Wheezing present. No rales.  Chest:     Chest wall: No tenderness.  Abdominal:     General: Bowel sounds are normal. There is no distension.     Palpations: Abdomen is soft. There is no mass.     Tenderness: There is no abdominal tenderness. There is no guarding or rebound.  Musculoskeletal:        General: No tenderness or deformity. Normal range of motion.     Cervical back: Normal range of motion and neck supple.  Lymphadenopathy:     Cervical: No cervical adenopathy.  Skin:    General: Skin is warm and dry.     Capillary Refill: Capillary refill takes less than 2 seconds.     Coloration: Skin is not pale.     Findings: No erythema or rash.  Neurological:     Mental Status: He is alert and oriented to person, place, and time.     Cranial Nerves: No cranial nerve deficit.     Motor: No abnormal muscle tone.     Coordination: Coordination normal.     Deep Tendon Reflexes: Reflexes are normal and symmetric.  Psychiatric:        Behavior: Behavior normal.        Thought Content: Thought content normal.        Judgment: Judgment normal.  Assessment/Plan: 1. Obstructive chronic bronchitis without exacerbation (HCC) FEV1 has worsened. Samples of breztri provided, if it works well for him, will send prescription in to pharmacy.   2. SOB (shortness of breath) Spirometry has worsened.  - Spirometry with Graph  3.  Essential hypertension BP is elevated, patient does forget to take his medications sometimes. Will reevaluate blood pressure at next office visit.    General Counseling: Adam Merritt verbalizes understanding of the findings of todays visit and agrees with plan of treatment. I have discussed any further diagnostic evaluation that may be needed or ordered today. We also reviewed his medications today. he has been encouraged to call the office with any questions or concerns that should arise related to todays visit.    Orders Placed This Encounter  Procedures   Spirometry with Graph    No orders of the defined types were placed in this encounter.   Return in about 3 months (around 08/22/2021) for F/U, PFT @ [employer], [facility] or [facility].   Total time spent:30 Minutes Time spent includes review of chart, medications, test results, and follow up plan with the patient.   [place] Controlled Substance Database was reviewed by me.  This patient was seen by Jonetta Osgood, FNP-C in collaboration with Dr. [name] as a part of collaborative care agreement.   Refugio Vandevoorde R. Valetta Fuller, MSN, FNP-C Internal medicine

## 2021-05-26 ENCOUNTER — Ambulatory Visit: Payer: Medicare PPO | Admitting: Physician Assistant

## 2021-05-31 ENCOUNTER — Other Ambulatory Visit: Payer: Self-pay

## 2021-05-31 ENCOUNTER — Encounter: Payer: Medicare PPO | Admitting: Internal Medicine

## 2021-05-31 DIAGNOSIS — I872 Venous insufficiency (chronic) (peripheral): Secondary | ICD-10-CM | POA: Diagnosis not present

## 2021-05-31 DIAGNOSIS — E11621 Type 2 diabetes mellitus with foot ulcer: Secondary | ICD-10-CM | POA: Diagnosis not present

## 2021-05-31 DIAGNOSIS — I890 Lymphedema, not elsewhere classified: Secondary | ICD-10-CM | POA: Diagnosis not present

## 2021-05-31 DIAGNOSIS — L97822 Non-pressure chronic ulcer of other part of left lower leg with fat layer exposed: Secondary | ICD-10-CM | POA: Diagnosis not present

## 2021-05-31 DIAGNOSIS — L97812 Non-pressure chronic ulcer of other part of right lower leg with fat layer exposed: Secondary | ICD-10-CM | POA: Diagnosis not present

## 2021-05-31 DIAGNOSIS — L89612 Pressure ulcer of right heel, stage 2: Secondary | ICD-10-CM | POA: Diagnosis not present

## 2021-05-31 DIAGNOSIS — I509 Heart failure, unspecified: Secondary | ICD-10-CM | POA: Diagnosis not present

## 2021-05-31 DIAGNOSIS — I110 Hypertensive heart disease with heart failure: Secondary | ICD-10-CM | POA: Diagnosis not present

## 2021-05-31 DIAGNOSIS — E11622 Type 2 diabetes mellitus with other skin ulcer: Secondary | ICD-10-CM | POA: Diagnosis not present

## 2021-06-01 NOTE — Progress Notes (Signed)
FERNAND, SORBELLO ([id number]) Visit Report for 05/31/2021 Arrival Information Details Patient Name: Adam, Merritt Date of Service: 05/31/2021 12:45 PM Medical Record Number: [id number] Patient Account Number: [id number] Date of Birth/Sex: [birth year] (75 y.o. M) Treating RN: Levora Dredge Primary Care Gabryella Murfin: [name] Other Clinician: Referring Malaney Mcbean: [name] Treating Aluna Whiston/Extender: Tito Dine in Treatment: 9 Visit Information History Since Last Visit Added or deleted any medications: No Patient Arrived: Wheel Chair Any new allergies or adverse reactions: No Arrival Time: 12:41 Had a fall or experienced change in No Accompanied By: wife activities of daily living that may affect Transfer Assistance: EasyP[employer] Patient risk of falls: Lift Hospitalized since last visit: No Patient Identification Verified: Yes Has Dressing in Place as Prescribed: Yes Secondary Verification Process Completed: Yes Has Compression in Place as Prescribed: Yes Patient Requires Transmission-Based No Pain Present Now: Yes Precautions: Patient Has Alerts: Yes Patient Alerts: diabetic type 2 Electronic Signature(s) Signed: 05/31/2021 1:34:03 PM By: Levora Dredge Entered By: Levora Dredge on 05/31/2021 13:34:03 Adam Merritt ([id number]) -------------------------------------------------------------------------------- Compression Therapy Details Patient Name: Adam Merritt Date of Service: 05/31/2021 12:45 PM Medical Record Number: [id number] Patient Account Number: [id number] Date of Birth/Sex: 01/29/1946 (75 y.o. M) Treating RN: Donnamarie Poag Primary Care Renesmae Donahey: [name] Other Clinician: Referring Octavious Zidek: [name] Treating Clemie General/Extender: Tito Dine in Treatment: 9 Compression Therapy Performed for Wound Assessment: Wound #43 Right Calcaneus Performed By: Junius Argyle, RN Compression Type: Four Layer Post Procedure  Diagnosis Same as Pre-procedure Electronic Signature(s) Signed: 05/31/2021 4:22:18 PM By: Donnamarie Poag Entered By: Donnamarie Poag on 05/31/2021 16:22:17 Adam Merritt ([id number]) -------------------------------------------------------------------------------- Compression Therapy Details Patient Name: Adam Merritt Date of Service: 05/31/2021 12:45 PM Medical Record Number: [id number] Patient Account Number: [id number] Date of Birth/Sex: 01/20/1946 (75 y.o. M) Treating RN: Donnamarie Poag Primary Care Peachie Barkalow: [name] Other Clinician: Referring Shakoya Gilmore: [name] Treating Diyana Starrett/Extender: Tito Dine in Treatment: 9 Compression Therapy Performed for Wound Assessment: NonWound Condition Lymphedema - Left Leg Performed By: Junius Argyle, RN Compression Type: Four Layer Post Procedure Diagnosis Same as Pre-procedure Electronic Signature(s) Signed: 05/31/2021 4:22:32 PM By: Donnamarie Poag Entered By: Donnamarie Poag on 05/31/2021 16:22:32 Adam Merritt ([id number]) -------------------------------------------------------------------------------- Encounter Discharge Information Details Patient Name: Adam Merritt Date of Service: 05/31/2021 12:45 PM Medical Record Number: [id number] Patient Account Number: [id number] Date of Birth/Sex: Sep 11, 1945 (75 y.o. M) Treating RN: Donnamarie Poag Primary Care Tram Wrenn: [name] Other Clinician: Referring Selvin Yun: [name] Treating Andreanna Mikolajczak/Extender: Tito Dine in Treatment: 9 Encounter Discharge Information Items Discharge Condition: Stable Ambulatory Status: Wheelchair Discharge Destination: Home Transportation: Private Auto Accompanied By: wife Schedule Follow-up Appointment: Yes Clinical Summary of Care: Electronic Signature(s) Signed: 05/31/2021 4:26:43 PM By: Donnamarie Poag Entered By: Donnamarie Poag on 05/31/2021 16:26:43 Adam Merritt  ([id number]) -------------------------------------------------------------------------------- Lower Extremity Assessment Details Patient Name: Adam Merritt Date of Service: 05/31/2021 12:45 PM Medical Record Number: [id number] Patient Account Number: [id number] Date of Birth/Sex: July 14, 1945 (75 y.o. M) Treating RN: Levora Dredge Primary Care Brahm Barbeau: [name] Other Clinician: Referring Lekendrick Alpern: [name] Treating Gizelle Whetsel/Extender: Tito Dine in Treatment: 9 Edema Assessment Assessed: [Left: No] [Right: No] Edema: [Left: Yes] [Right: Yes] Calf Left: Right: Point of Measurement: 36 cm From Medial Instep 52 cm 45.8 cm Ankle Left: Right: Point of Measurement: 10 cm From Medial Instep 40 cm 30.5 cm Vascular Assessment Pulses: Dorsalis Pedis Doppler Audible: [Left:Yes] [Right:Yes] Electronic Signature(s) Signed: 05/31/2021 4:34:52 PM By: Levora Dredge  Entered By: Levora Dredge on 05/31/2021 13:17:58 Adam Merritt ([id number]) -------------------------------------------------------------------------------- Multi Wound Chart Details Patient Name: Adam Merritt Date of Service: 05/31/2021 12:45 PM Medical Record Number: [id number] Patient Account Number: [id number] Date of Birth/Sex: Sep 18, 1945 (75 y.o. M) Treating RN: Levora Dredge Primary Care Nazaria Riesen: [name] Other Clinician: Referring Ryanna Teschner: [name] Treating Anush Wiedeman/Extender: Tito Dine in Treatment: 9 Vital Signs Height(in): 66 Pulse(bpm): 35 Weight(lbs): 300 Blood Pressure(mmHg): 134/75 Body Mass Index(BMI): 48 Temperature(F): 97.7 Respiratory Rate(breaths/min): 18 Photos: [N/A:N/A] Wound Location: Right Calcaneus N/A N/A Wounding Event: Gradually Appeared N/A N/A Primary Etiology: Diabetic Wound/Ulcer of the Lower N/A N/A Extremity Comorbid History: Anemia, Lymphedema, Chronic N/A N/A Obstructive Pulmonary Disease (COPD), Sleep Apnea, Congestive Heart  Failure, Coronary Artery Disease, Hypertension, Peripheral Venous Disease, Type II Diabetes, Osteoarthritis, Neuropathy, Received Chemotherapy Date Acquired: 05/31/2021 N/A N/A Weeks of Treatment: 0 N/A N/A Wound Status: Open N/A N/A Measurements L x W x D (cm) 1x1.9x0.2 N/A N/A Area (cm) : 1.492 N/A N/A Volume (cm) : 0.298 N/A N/A Classification: Grade 1 N/A N/A Exudate Amount: Medium N/A N/A Exudate Type: Serosanguineous N/A N/A Exudate Color: red, brown N/A N/A Granulation Amount: Small (1-33%) N/A N/A Granulation Quality: Pink N/A N/A Necrotic Amount: Medium (34-66%) N/A N/A Exposed Structures: Fat Layer (Subcutaneous Tissue): N/A N/A Yes Epithelialization: Small (1-33%) N/A N/A Treatment Notes Electronic Signature(s) Signed: 05/31/2021 4:34:52 PM By: Levora Dredge Entered By: Levora Dredge on 05/31/2021 13:43:01 Adam Merritt ([id number]) -------------------------------------------------------------------------------- [place] Details Patient Name: Adam Merritt Date of Service: 05/31/2021 12:45 PM Medical Record Number: [id number] Patient Account Number: [id number] Date of Birth/Sex: 28-Dec-1945 (75 y.o. M) Treating RN: Levora Dredge Primary Care Antoinne Spadaccini: [name] Other Clinician: Referring Rosely Fernandez: [name] Treating Cicily Bonano/Extender: Tito Dine in Treatment: 9 Active Inactive Electronic Signature(s) Signed: 05/31/2021 4:34:52 PM By: Levora Dredge Entered By: Levora Dredge on 05/31/2021 13:42:13 Adam Merritt ([id number]) -------------------------------------------------------------------------------- Non-Wound Condition Assessment Details Patient Name: Adam Merritt Date of Service: 05/31/2021 12:45 PM Medical Record Number: [id number] Patient Account Number: [id number] Date of Birth/Gender: [birth year] (75 y.o. M) Treating RN: Levora Dredge Primary Care Physician: [name] Other Clinician: Referring  Physician: [name] Treating Physician/Extender: Ricard Dillon [name]: 9 Non-Wound Condition: Condition: Lymphedema Location: Leg Side: Left Photos Electronic Signature(s) Signed: 05/31/2021 4:34:52 PM By: Levora Dredge Entered By: Levora Dredge on 05/31/2021 13:30:48 Adam Merritt ([id number]) -------------------------------------------------------------------------------- Non-Wound Condition Assessment Details Patient Name: Adam Merritt Date of Service: 05/31/2021 12:45 PM Medical Record Number: [id number] Patient Account Number: [id number] Date of Birth/Sex: [birth year] (75 y.o. M) Treating RN: Levora Dredge Primary Care Vlada Uriostegui: [name] Other Clinician: Referring Shaunessy Dobratz: [name] Treating Kaysin Brock/Extender: Ricard Dillon [name]: 9 Non-Wound Condition: Condition: Lymphedema Location: Leg Side: Right Photos Electronic Signature(s) Signed: 05/31/2021 4:34:52 PM By: Levora Dredge Entered By: Levora Dredge on 05/31/2021 13:30:54 Adam Merritt ([id number]) -------------------------------------------------------------------------------- Pain Assessment Details Patient Name: Adam Merritt Date of Service: 05/31/2021 12:45 PM Medical Record Number: [id number] Patient Account Number: [id number] Date of Birth/Sex: 02/03/1946 (75 y.o. M) Treating RN: Levora Dredge Primary Care Paxtyn Boyar: [name] Other Clinician: Referring Anfernee Peschke: [name] Treating Lulla [name]/Extender: Tito Dine in Treatment: 9 Active Problems Location of Pain Severity and Description of Pain Patient Has Paino Yes Site Locations Rate the pain. Current Pain Level: 8 Pain Management and Medication Current Pain Management: Notes pt states general pain 8/10 is where his pain lives Electronic Signature(s) Signed: 05/31/2021 4:34:52 PM  By: Levora Dredge Entered By: Levora Dredge on 05/31/2021 12:46:47 Adam Merritt  ([id number]) -------------------------------------------------------------------------------- Patient/Caregiver Education Details Patient Name: Adam Merritt Date of Service: 05/31/2021 12:45 PM Medical Record Number: [id number] Patient Account Number: [id number] Date of Birth/Gender: November 17, 1945 (75 y.o. M) Treating RN: Donnamarie Poag Primary Care Physician: [name] Other Clinician: Referring Physician: [name] Treating Physician/Extender: Tito Dine in Treatment: 9 Education Assessment Education Provided To: Patient and Caregiver Education Topics Provided Wound/Skin Impairment: Engineer, maintenance) Signed: 06/01/2021 1:20:37 PM By: Donnamarie Poag Entered By: Donnamarie Poag on 05/31/2021 16:22:56 Adam Merritt ([id number]) -------------------------------------------------------------------------------- Wound Assessment Details Patient Name: Adam Merritt Date of Service: 05/31/2021 12:45 PM Medical Record Number: [id number] Patient Account Number: [id number] Date of Birth/Sex: 1946-04-24 (75 y.o. M) Treating RN: Levora Dredge Primary Care Eriberto Felch: [name] Other Clinician: Referring Marlene Beidler: [name] Treating Lavora Brisbon/Extender: Tito Dine in Treatment: 9 Wound Status Wound Number: 43 Primary Diabetic Wound/Ulcer of the Lower Extremity Etiology: Wound Location: Right Calcaneus Wound Open Wounding Event: Gradually Appeared Status: Date Acquired: 05/31/2021 Comorbid Anemia, Lymphedema, Chronic Obstructive Pulmonary Weeks Of Treatment: 0 History: Disease (COPD), Sleep Apnea, Congestive Heart Failure, Clustered Wound: No Coronary Artery Disease, Hypertension, Peripheral Venous Disease, Type II Diabetes, Osteoarthritis, Neuropathy, Received Chemotherapy Photos Wound Measurements Length: (cm) 1 Width: (cm) 1.9 Depth: (cm) 0.2 Area: (cm) 1.492 Volume: (cm) 0.298 % Reduction in Area: % Reduction in Volume: Epithelialization: Small  (1-33%) Wound Description Classification: Grade 1 Exudate Amount: Medium Exudate Type: Serosanguineous Exudate Color: red, brown Foul Odor After Cleansing: No Slough/Fibrino Yes Wound Bed Granulation Amount: Small (1-33%) Exposed Structure Granulation Quality: Pink Fat Layer (Subcutaneous Tissue) Exposed: Yes Necrotic Amount: Medium (34-66%) Necrotic Quality: Adherent Slough Treatment Notes Wound #43 (Calcaneus) Wound Laterality: Right Cleanser Soap and Water Discharge Instruction: Gently cleanse wound with antibacterial soap, rinse and pat dry prior to dressing wounds Peri-Wound Care Topical AKIL, HOOS ([id number]) Triamcinolone Acetonide Cream, 0.1%, 15 (g) tube Discharge Instruction: Apply as directed by Cornie Mccomber. Primary Dressing Silvercel Small 2x2 (in/in) Discharge Instruction: Apply Silvercel Small 2x2 (in/in) as instructed Secondary Dressing ABD Pad 5x9 (in/in) Discharge Instruction: Cover with ABD pad Secured With Compression Wrap Medichoice 4 layer Compression System, 35-40 mmHG Discharge Instruction: Apply multi-layer wrap as directed. Compression Stockings Add-Ons Electronic Signature(s) Signed: 05/31/2021 4:34:52 PM By: Levora Dredge Entered By: Levora Dredge on 05/31/2021 13:27:46 Adam Merritt ([id number]) -------------------------------------------------------------------------------- Vitals Details Patient Name: Adam Merritt Date of Service: 05/31/2021 12:45 PM Medical Record Number: [id number] Patient Account Number: [id number] Date of Birth/Sex: 1946-02-04 (75 y.o. M) Treating RN: Levora Dredge Primary Care Jeannine Pennisi: [name] Other Clinician: Referring Iyari Hagner: [name] Treating Angelene Rome/Extender: Tito Dine in Treatment: 9 Vital Signs Time Taken: 12:43 Temperature (F): 97.7 Height (in): 66 Pulse (bpm): 64 Weight (lbs): 300 Respiratory Rate (breaths/min): 18 Body Mass Index (BMI): 48.4 Blood Pressure  (mmHg): 134/75 Reference Range: 80 - 120 mg / dl Electronic Signature(s) Signed: 05/31/2021 4:34:52 PM By: Levora Dredge Entered By: Levora Dredge on 05/31/2021 94:50:38

## 2021-06-03 NOTE — Progress Notes (Signed)
RAGAN, REALE ([id number]) Visit Report for 05/31/2021 HPI Details Patient Name: Adam Merritt, Adam Merritt Date of Service: 05/31/2021 12:45 PM Medical Record Number: [id number] Patient Account Number: [id number] Date of Birth/Sex: 19-Feb-1946 (75 y.o. M) Treating RN: Levora Dredge Primary Care Provider: [name] Other Clinician: Referring Provider: [name] Treating Provider/Extender: Tito Dine in Treatment: 9 History of Present Illness HPI Description: 10/18/17-He is here for initial evaluation of bilateral lower extremity ulcerations in the presence of venous insufficiency and lymphedema. He has been seen by vascular medicine in the past, Dr. Lucky Cowboy, last seen in 2016. He does have a history of abnormal ABIs, which is to be expected given his lymphedema and venous insufficiency. According to Epic, it appears that all attempts for arterial evaluation and/or angiography were not follow through with by patient. He does have a history of being seen in lymphedema clinic in 2018, stopped going approximately 6 months ago stating "it didn't do any good". He does not have lymphedema pumps, he does not have custom fit compression wrap/stockings. He is diabetic and his recent A1c last month was 7.6. He admits to chronic bilateral lower extremity pain, no change in pain since blister and ulceration development. He is currently being treated with Levaquin for bronchitis. He has home health and we will continue. 10/25/17-He is here in follow-up evaluation for bilateral lower extremity ulcerationssubtle he remains on Levaquin for bronchitis. Right lower extremity with no evidence of drainage or ulceration, persistent left lower extremity ulceration. He states that home health has not been out since his appointment. He went to [facility] on Tuesday, studies revealed: RIGHT ABI 0.9, TBI 0.6 LEFT ABI 1.1, TBI 0.6 with triphasic flow bilaterally. We will continue his same treatment plan.  He has been educated on compression therapy and need for elevation. He will benefit from lymphedema pumps 11/01/17-He is here in follow-up evaluation for left lower extremity ulcer. The right lower extremity remains healed. He has home health services, but they have not been out to see the patient for 2-3 weeks. He states it home health physical therapy changed his dressing yesterday after therapy; he placed Ace wrap compression. We are still waiting for lymphedema pumps, reordered d/t need for company change. 11/08/17-He is here in follow-up evaluation for left lower extremity ulcer. It is improved. Edema is significantly improved with compression therapy. We will continue with same treatment plan and he will follow-up next week. No word regarding lymphedema pumps 11/15/17-He is here in follow-up evaluation for left lower extremity ulcer. He is healed and will be discharged from wound care services. I have reached out to [facility] regarding his lymphedema pumps. They have been unable to reach the patient; the contact number they had with the patient's wife's cell phone and she has not answered any unrecognized calls. Contact should be made today, trial planned for next week; [facility] will continue to follow 11/27/17 on evaluation today patient has multiple blistered areas over the right lower extremity his left lower extremity appears to be doing okay. These blistered areas show signs of no infection which is great news. With that being said he did have some necrotic skin overlying which was mechanically debrided away with saline and gauze today without complication. Overall post debridement the wounds appear to be doing better but in general his swelling seems to be increased. This is obviously not good news. I think this is what has given rise to the blisters. 12/04/17 on evaluation today patient presents for  follow-up concerning his bilateral lower extremity edema in the right lower  extremity ulcers. He has been tolerating the dressing changes without complication. With that being said he has had no real issues with the wraps which is also good news. Overall I'm pleased with the progress he's been making. 12/11/17 on evaluation today patient appears to be doing rather well in regard to his right lateral lower extremity ulcer. He's been tolerating the dressing changes without complication. Fortunately there does not appear to be any evidence of infection at this time. Overall I'm pleased with the progress that is being made. Unfortunately he has been in the hospital due to having what sounds to be a stomach virus/flu fortunately that is starting to get better. 12/18/17 on evaluation today patient actually appears to be doing very well in regard to his bilateral lower extremities the swelling is under fairly good control his lymphedema pumps are still not up and running quite yet. With that being said he does have several areas of opening noted as far as wounds are concerned mainly over the left lower extremity. With that being said I do believe once he gets lymphedema pumps this would least help you mention some the fluid and preventing this from occurring. Hopefully that will be set up soon sleeves are Artie in place at his home he just waiting for the machine. 12/25/17 on evaluation today patient actually appears to be doing excellent in fact all of his ulcers appear to have resolved his legs appear very well. I do think he needs compression stockings we have discussed this and they are actually going to go to [place] today to elastic therapy to get this fitted for him. I think that is definitely a good thing to do. Readmission: 04/09/18 upon evaluation today this patient is seen for readmission due to bilateral lower extremity lymphedema. He has significant swelling of his extremities especially on the left although the right is also swollen he has weeping from both sides. There  are no obvious open wounds at this point. Fortunately he has been doing fairly well for quite a bit of time since I last saw him. Nonetheless unfortunately this seems to have reopened and is giving quite a bit of trouble. He states this began about a week ago when he first called [place] to get in to be seen. No fevers, chills, nausea, or vomiting noted at this time. He has not been using his lymphedema pumps due to the fact that they won't fit on his leg at this point likewise is also not been using his compression for essentially the same reason. 04/16/18 upon evaluation today patient actually appears to be doing a little better in regard to the fluid in his bilateral lower extremities. With that being said he's had three falls since I saw him last week. He also states that he's been feeling very poorly. I was concerned last week and feel like that the concern is still there as far as the congestion in his chest is concerned he seems to be breathing about the same as last week but again he states he's very weak he's not even able to walk further than from the chair to the door. His wife had to buy a wheelchair just to be able to get them out of the house to get to the appointment today. This has me very concerned. HUTSON, LUFT ([id number]) 04/23/18 on evaluation today patient actually appears to be doing much better than last week's evaluation. At that  time actually had to transport him to the ER via EMS and he subsequently was admitted for acute pulmonary edema, acute renal failure, and acute congestive heart failure. Fortunately he is doing much better. Apparently they did dialyze him and were able to take off roughly 35 pounds of fluid. Nonetheless he is feeling much better both in regard to his breathing and he's able to get around much better at this time compared to previous. Overall I'm very happy with how things are at this time. There does not appear to be any evidence of infection currently.  No fevers, chills, nausea, or vomiting noted at this time. 04/30/2018 patient seen today for follow-up and management of bilateral lower extremity lymphedema. He did express being more sad today than usual due to the recent loss of his dog. He states that he has been compliant with using the lymphedema pumps. However he does admit a minute over the last 2-3 days he has not been using the pumps due to the recent loss of his dog. At this time there is no drainage or open wounds to his lower extremities. The left leg edema is measuring smaller today. Still has a significant amount of edema on bilateral lower extremities With dry flaky skin. He will be referred to the lymphedema clinic for further management. Will continue 3 layer compression wraps and follow-up in 1-2 weeks.Denies any pain, fever, chairs recently. No recent falls or injuries reported during this visit. 05/07/18 on evaluation today patient actually appears to be doing very well in regard to his lower extremities in general all things considered. With that being said he is having some pain in the legs just due to the amount of swelling. He does have an area where he had a blister on the left lateral lower extremity this is open at this point other than that there's nothing else weeping at this time. 05/14/18 on evaluation today patient actually appears to be doing excellent all things considered in regard to his lower extremities. He still has a couple areas of weeping on each leg which has continued to be the issue for him. He does have an appointment with the lymphedema clinic although this isn't until February 2020. That was the earliest they had. In the meantime he has continued to tolerate the compression wraps without complication. 05/28/18 on evaluation today patient actually appears to be doing more poorly in regard to his left lower extremity where he has a wound open at this point. He also had a fall where he subsequently injured  his right great toe which has led to an open wound at the site unfortunately. He has been tolerating the dressing changes without complication in general as far as the wraps are concerned that he has not been putting any dressing on the left 1st toe ulcer site. 06/11/18 on evaluation today patient appears to be doing much worse in regard to his bilateral lower extremity ulcers. He has been tolerating the dressing changes without complication although his legs have not been wrapped more recently. Overall I am not very pleased with the way his legs appear. I do believe he needs to be back in compression wraps he still has not received his compression wraps from the [facility] hospital as of yet. 06/18/18 on evaluation today patient actually appears to be doing significantly better than last time I saw him. He has been tolerating the compression wraps without complication in the circumferential ulcers especially appear to be doing much better. His toe ulcer  on the right in regard to the great toe is better although not as good as the legs in my pinion. No fevers chills noted 07/02/18 on evaluation today patient appears to be doing much better in regard to his lower extremity ulcers. Unfortunately since I last saw him he's had the distal portion of his right great toe if he dated it sounds as if this actually went downhill very quickly. I had only seen him a few days prior and the toe did not appear to be infected at that point subsequently became infected very rapidly and it was decided by the surgeon that the distal portion of the toe needed to be removed. The patient seems to be doing well in this regard he tells me. With that being said his lower extremities are doing better from the standpoint of the wounds although he is significantly swollen at this point. 07/09/18 on evaluation today patient appears to be doing better in regard to the wounds on his lower extremities. In fact everything is almost completely  healed he is just a small area on the left posterior lower extremity that is open at this point. 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 With that being said he needs some kind of compression it does sound like he may not be wearing his compression, that is the wraps, during the entire time between when he's here visit to visit. Apparently his wife took the current one off because it began to "fall apart". 07/16/18 on evaluation today patient appears to be extremely swollen especially in regard to his left lower extremity unfortunately. He also has a new skin tear over the left lower extremity and there's a smaller area on the right lower extremity as well. Unfortunately this seems to be due in part to blistering and fluid buildup in his leg. He did get the reduction wraps that were ordered by the [facility] hospital for him to go to lymphedema clinic. With that being said his wounds on the legs have not healed to the point to where they would likely accept them as a patient lymphedema clinic currently. We need to try to get this to heal. With that being said he's been taking his wraps off which is not doing him any favors at this point. In fact this is probably quite counterproductive compared to what needs to occur. We will likely need to increase to a four layer compression wrap and continue to also utilize elevation and he has to keep the wraps on not take them off as he's been doing currently hasn't had a wrap on since Saturday. 07/23/18 on evaluation today patient appears to be doing much better in regard to his bilateral lower extremities. In fact his left lower extremity which was the largest is actually 15 cm smaller today compared to what it was last time he was here in our clinic. This is obviously good news after  just one week. Nonetheless the differences he actually kept the wraps on during the entire week this time. That's not typical for him. I do believe he understands a little bit better now the severity of the situation and why it's important for him to keep these wraps on. 07/30/18 on evaluation today patient actually appears to be doing rather well in regard to his lower extremities. His legs are  much smaller than they have been in the past and he actually has only one very small rudder superficial region remaining that is not closed on the left lateral/posterior lower extremity even this is almost completely close. I do believe likely next week he will be healed without any complications. I do think we need to continue the wraps however this seems to be beneficial for him. I also think it may be a good time for [place] to go ahead and see about getting the appointment with the lymphedema clinic which is supposed to be made for him in order to keep this moving along and hopefully get them into compression wraps that will in the end help him to remain healed. 08/06/18 on evaluation today patient actually appears to be doing very well in regard as bilateral lower extremities. In fact his wounds appear to be completely healed at this time. 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 He is in the process of getting appointment with the lymphedema clinic we have made this referral were just waiting to hear back on the schedule time. We need to follow up on that today as well. 08/13/18 on evaluation today patient actually appears to be doing very well in regard to his bilateral lower extremities there are no open wounds at this point. We are gonna go ahead and see about ordering the Velcro compression wraps for him had a discussion with them about [place] doing it versus the [place] they feel like they can definitely afford going ahead and get the wraps themselves and  they would prefer to try to avoid having to go to the lymphedema clinic if it all possible which I completely understand. As long as he has good compression I'm okay either way. Adam Merritt, Adam Merritt ([id number]) 3/17//20 on evaluation today patient actually appears to be doing well in regard to his bilateral lower extremity ulcers. He has been tolerating the dressing changes without complication specifically the compression wraps. Overall is had no issues my fingers finding that I see at this point is that he is having trouble with constipation. He tells me he has not been able to go to the bathroom for about six days. He's taken to over-the- counter oral laxatives unfortunately this is not helping. He has not contacted his doctor. 08/27/18 on evaluation today patient appears to be doing fairly well in regard to his lower extremities at this point. There does not appear to be any new altars is swelling is very well controlled. We are still waiting for his Velcro compression wraps to arrive that should be sometime in the next week is Artie sent the check for these. 09/03/18 on evaluation today patient appears to be doing excellent in regard to his bilateral lower extremities which he shows no signs of wound openings in regard to this point. He does have his Velcro compression wraps which did arrive in the mail since I saw him last week. Overall he is doing excellent in my pinion. 09/13/18 on evaluation today patient appears to be doing very well currently in regard to the overall appearance of his bilateral lower extremities although he's a little bit more swollen than last time we saw him. At that point he been discharged without any open wounds. Nonetheless he has a small open wound on the posterior left lower extremity with some evidence of cellulitis noted as well. Fortunately I feel like he has made good progress overall with regard to his  lower extremities from were things used to be. 09/20/18 on  evaluation today patient actually appears to be doing much better. The erythematous lower extremity is improving wound itself which is still open appears to be doing much better as far as but appearance as well as pain is concerned overall very pleased in this regard. There's no signs of active infection at this time. 09/27/18 on evaluation today patient's wounds on the lower extremity actually appear to be doing fairly well at this time which is good news. There is no evidence of active infection currently and again is just as left lower extremity were there any wounds at this point anyway. I believe they may be completely healed but again I'm not 100% sure based on evaluation today. I think one more week of observation would likely be a good idea. 10/04/18 on evaluation today patient actually appears to be doing excellent in regard to the left lower extremity which actually appears to be completely healed as of today. Unfortunately he's been having issues with his right lower extremity have a new wound that has opened. Fortunately there's no evidence of active infection at this time which is good news. 10/10/18 upon evaluation today patient actually appears to be doing a little bit worse with the new open area on his right posterior lower extremity. He's been tolerating the dressing changes without complication. Right now we been using his compression wraps although I think we may need to switch back to actually performing bilateral compression wraps are in the clinic. No fevers, chills, nausea, or vomiting noted at this time. 10/17/18 on evaluation today patient actually appears to be doing quite well in regard to his bilateral lower extremity ulcers. He has been tolerating the reps without complication although he would prefer not to rewrap his legs as of today. Fortunately there's no signs of active infection which is good news. No fevers, chills, nausea, or vomiting noted at this time. 10/24/18 on  evaluation today patient appears to be doing very well in regard to his lower extremities. His right lower extremity is shown signs of healing and his left lower Trinity though not healed appears to be improving which is excellent news. Overall very pleased with how things seem to be progressing at this time. The patient is likewise happy to hear this. His Velcro compression wraps however have not been put on properly will gonna show his wife how to do that properly today. 10/31/18 on evaluation today patient appears to be doing more poorly in regard to his left lower extremity in particular although both lower extremities actually are showing some signs of being worse than my previous evaluation. Unfortunately I'm just not sure that his compression stockings even with the use of the compression/lymphedema pumps seem to be controlling this well. Upon further questioning he tells me that he also is not able to lie flat in the bed due to his congestive heart failure and difficulty breathing. For that reason he sleeps in his recliner. To make matters worse his recliner also cannot even hold his legs up so instead of even being somewhat elevated they pretty much hang down to the floor. This is the way he sleeps each night which is definitely counterproductive to everything else that were attempting to do from the standpoint of controlling his fluid. Nonetheless I think that he potentially could benefit from a hospital bed although this would be something that his primary care provider would likely have to order since anything that is order  on our side has to be directly related to wound care and again the hospital bed is not necessarily a direct relation to although I think it does contribute to his overall wound status on his lower extremities. 11/07/18 on evaluation today patient appears to be doing better in regard to his bilateral lower extremity. He's been tolerating the dressing changes without  complication. We did in the interim since I last saw him switch to just using extras orbiting new alginate and that seems to have done much better for him. I'm very pleased with the overall progress that is made. 11/14/18 on evaluation today patient appears to be redoing rather well in regard to his left lower extremity ulcers which are the only ones remaining at this point. Fortunately there's no signs of active infection at this time which is good news. Overall been very pleased with how things seem to be progressing currently. No fevers, chills, nausea, or vomiting noted at this time. 11/20/17 on evaluation today patient actually appears to be doing quite well in regard to his lower extremities on the left on the right he has several blisters that showed up although there's some question about whether or not he has had his broker compression wraps on like he was supposed to or not. Fortunately there's no signs of active infection at this time which is good news. Unfortunately though he is doing well from the standpoint of the left leg the right leg is not doing as well again this is when we did not wrap last week. 11/28/18 on evaluation today patient appears to be doing well in regard to his bilateral lower extremities is left appears to be healed is right is not healed but is very close to being so. Overall very pleased with how things seem to be progressing. Patient is likewise happy that things are doing well. 12/09/18 on evaluation today patient actually appears to be completely healed which is excellent news. He actually seems to be doing well in regard to the swelling of the bilateral lower extremities which is also great news. Overall very pleased with how things seem to be progressing. Readmission: 03/18/2019 upon evaluation today patient presents for reevaluation concerning issues that he is having with his left lower extremity where he does have a wound noted upon inspection today. He also  has a wound on the right second toe on the tip where it is apparently been rubbing in his shoe I did look at issues and you can actually see where this has been occurring as well. Fortunately there is no signs of infection with regard to the toe necessarily although it is very swollen compared to normal that does have me somewhat concerned about the possibility of further Adam Merritt, Adam Merritt ([id number]) evaluation for infection/osteomyelitis. I would recommend an x-ray to start with. Otherwise he states that it is been 1-2 weeks that he has had the draining in regard to left lower extremity he does not know how this happened he has been wearing his compression appropriately and I do see that as well today but nonetheless I do think that he needs to continue to be very cautious with regard to elevation as well. 03/25/2019 on evaluation today patient appears to be doing much better in regard to his lower extremity at this time. That is on the left. He did culture positive for Pseudomonas but the good news is he seems to be doing much better the gentamicin we applied topically seems to have done a great  job for him. There is no signs of active infection at this time systemically and locally he is doing much better. No fevers, chills, nausea, vomiting, or diarrhea. In regard to his right toe this seems to be doing much better there is very little pressure noted at this point I did clean up some of the callus today around the edges of the wound as well as the surface of the wound but to be honest he is progressing quite nicely. 10/30; the area on the left anterior lower tibia area is healed over. His edema control is adequate even though his use of the compression pumps seems very intermittent. He has a juxta lite stocking on the right leg and a think there is 1 for the left leg at home. His wife is putting these on. He has an area on the plantar right second toe which is a hammertoe they are trying to offload  this 04/08/2019 on evaluation today patient appears to be doing well in regard to his lower extremities which is good news. We are seeing him today for his toe ulcer which is giving him a little bit more trouble but again still seems to be doing better at this point which is good news. He is going require some sharp debridement in regard to the toe today however. 04/15/2019 on evaluation today patient actually appears to be doing quite well with regard to his toe ulcer. I am very pleased with how things are doing in that regard. With regard to his lower extremity edema in general he tells me he is not been using the lymphedema pumps regularly although he does not use them. I think that he needs to use them more regularly he does have a small blister on the left lower extremity this is not open at this point but obviously it something that can get worse if he does not keep the compression going and the pumps going as well. 04/22/2019 on evaluation today patient appears to be doing well with regard to his toe ulcer. He has been tolerating the dressing changes without complication. This is measuring slightly smaller this week as compared to last week. Fortunately there is no signs of active infection at this time. 05/06/2019 on evaluation today patient actually appears to be doing excellent. In fact his toe appears to be completely healed which is great news. There is no signs of active infection at this time. Readmission: Patient presents today for follow-up after having been discharged at the beginning of December 2020. He states that in recent weeks things have reopened and has been having more trouble at this point. With that being said fortunately there is no signs of active infection at this time. No fever chills noted. Nonetheless he also does have an issue with one of his toes as well that seems to be somewhat this is on the right foot second toe. 07/25/2019 upon evaluation today patient's lower  extremities appear to be doing excellent bilaterally. I really feel like he is showing signs of great improvement and in fact I think he is close to healing which is great news. There is no signs of active infection at this time. No fevers, chills, nausea, vomiting, or diarrhea. 07/31/2019 upon evaluation today patient appears to be doing excellent and in fact I think may be completely healed in regard to his right lower extremity that were still to monitor this 1 more week before closing it out. The left lower extremity is measuring smaller although not  completely closed seems to be doing excellent. 08/07/2019 upon evaluation today patient appears to be doing excellent in regard to his wounds. In fact the right lower extremity is completely healed there is no issues here. The left lower extremity has 1 very small area still remaining fortunately there is no signs of infection and overall I think he is very close to closure of the here as well. 08/14/19 upon evaluation today patient appears to be doing excellent in regard to his lower extremities. In fact he appears to be completely healed as of today. Fortunately there's no signs of active infection at this time. No fevers, chills, nausea, or vomiting noted at this time. READMISSION 09/26/2019 This is a 75 year old man who is well-known to this clinic having just been discharged on 08/14/2019. He has known lymphedema and chronic venous insufficiency. He has Farrow wrap stockings and also external compression pumps. He does not use the external compression pumps however. He does however apparently fairly faithfully use the stockings. They have not been lotion in his legs. He has developed new wounds on the right anterior as well as the left medial left lateral and left posterior calf. He returns for our review of this Past medical history includes coronary artery disease, hypertension, congestive heart failure, COPD, venous insufficiency with lymphedema,  type 2 diabetes. He apparently has compression pumps, Farrow wraps and at 1 point a hospital bed although I believe he sleeps in a recliner 10/03/2019 upon evaluation today patient appears to be doing better with regard to his wounds in general. He has been tolerating the dressing changes without complication. Fortunately there is no signs of active infection. No fevers, chills, nausea, vomiting, or diarrhea. I do believe the compression wraps are helping as they always have in the past. 10/10/2019 upon evaluation today patient appears to be doing excellent at this point. His right lower extremity is measuring much better and in fact is completely healed. His left lower extremity is also measuring better though not healed seems to be doing excellent at this point which is great news. Overall I am very pleased with how things appear currently. 10/27/2019 upon evaluation today patient appears to be doing quite well with regard to his wounds currently. He has been tolerating the dressing changes without complication. Fortunately there is no signs of active infection at this time. In fact he is almost completely healed on the left and has just a very small area open and on the right he is completely healed. 11/04/2019 upon evaluation today patient appears to be doing quite well with regard to his wounds. In fact he appears to be quite possibly completely healed on the left lower extremity now as well the right lower extremity is still doing well. With that being said unfortunately though this may be healed I think it is a very fragile area and I cannot even confirm there is not a very small opening still remaining. I really think he may benefit from 1 additional weeks wrapping before discontinuing. 11/10/2019 upon evaluation today patient appears to be doing well in regard to his original wounds in fact everything that we were treating last DAIQUAN, RESNIK. ([id number]) week is completely healed on the left.  Unfortunately he has a new skin tear just above where the wrap slipped down due to a fall he sustained causing this injury. 11/17/2019 on evaluation today patient appears to be doing better with regard to his wound. He has been tolerating the dressing changes without complication. Fortunately there  is no signs of active infection at this time. No fevers, chills, nausea, vomiting, or diarrhea. 11/24/2019 upon evaluation today patient appears to be doing well with regard to his wound. This is making good progress there does not appear to be signs of active infection but overall I do feel like he is headed in the correct direction. Overall he is tolerating the compression wrap as well. 12/02/2019 upon evaluation today patient appears to be doing well with regard to his leg ulcer. In fact this appears to be completely healed and this is the last of the wounds that he had but still the left anterior lower extremity. Fortunately there is no signs of active infection at this time. No fevers, chills, nausea, vomiting, or diarrhea. Readmission: 02/05/2020 on evaluation today patient appears to be doing well with regard to his wound all things considered. He actually tells me that a couple weeks ago he had trouble with his wraps and therefore took him off for a couple of days in order to give his legs a break. It was during this time that he actually developed increased swelling and edema of his legs and blisters on both legs. That is when he called to make the appointment. Nonetheless since that time he began wearing his compression wraps which are Velcro in the meantime and has done extremely well with this. In fact his leg appears to be under good control as far as edema is concerned today it is nothing like what I am seeing when he has come in for evaluations in the past. They have been using some of the silver alginate at home which has been beneficial for him. Fortunately there is no signs of active infection  at this time. No fevers, chills, nausea, vomiting, or diarrhea. 02/19/2020 on evaluation today patient unfortunately does not appear to be doing quite as well as I would like to see. He has no signs of active infection at this time but unfortunately he is not doing well in regard to the overall appearance of his left leg. He has a couple other areas that are weeping now and the leg is much more swollen. I think that he needs to actually have a compression wrap to try to manage this. 02/26/2020 on evaluation today patient appears to be doing well with regard to his left lower extremity ulcer. Fortunately the swelling is down I do believe the pressure wrap seems to be doing quite well. Fortunately there is no signs of active infection at this time. 03/05/2020 upon evaluation today patient appears to be doing excellent in regard to his legs at this point. Fortunately there is no signs of active infection which is great news. Overall he feels like he is completely healed which is great news. Readmission: 05/20/2020 upon evaluation today patient appears to be doing well with regard to his leg ulcer all things considered. He is actually coming back to see [place] after having had a reopening of the ulcers on his left leg in the past several weeks. He is out of dressing supplies and his wife is been try to take care of this as best she can. 06/10/2020 upon evaluation today patient unfortunately appears to be doing significantly worse today compared to when I last saw him. He and his wife both tell me that "there has been a lot going on". I did not actually go into detail about everything that has been happening but nonetheless he has not obviously been wearing his compression. He brings them  with him today but they were not on. I am not even certain to be honest that he could get those on at this point. Nonetheless I do believe that he is going to require compression wrapping at some point shortly but right now we  need to try to get what appears to be infection under control at this time. 06/28/2020 upon evaluation today patient appears to be doing well with regard to 06/28/2020 upon evaluation today patient appears to be doing well pretty well in regard to his left leg which is much better than previous. With that being said in regard to his right leg he does seem to be having some issues with a new wound after he sustained a fall. This fortunately is not too deep but does appear to be something we need to address. Neither deep which is good. 07/05/2020 on evaluation today patient appears to be doing well with regard to his wounds. In fact everything on the left leg appears to be pretty much healed on the right leg this is very close though not completely closed yet. Fortunately there is no evidence of active infection at this time. No fevers, chills, nausea, vomiting, or diarrhea. 07/13/2019 upon evaluation today patient appears to be making good progress which is great news. Overall I am extremely pleased with where things stand today. There does not appear to be any signs of active infection which is great news and overall his left leg appears healed still the right leg though not healed does seem to be making good progress which is excellent news. He is having no pain. 07/19/2020 on evaluation today patient appears to be doing well with regard to his wound. He has been tolerating the dressing changes without complication. Fortunately there does not appear to be any signs of active infection at this time. No fevers, chills, nausea, vomiting, or diarrhea. Readmission: 08/20/2020 on evaluation today patient presents for reevaluation here in the clinic concerning issues that he has been having with his bilateral lower extremities. Unfortunately this is an ongoing reoccurring issue. He tells me that he is really not certain exactly what caused the issue although as I discussed this further with him it appears that he  has been sitting for the most part and sleeping in his chair with his feet on the ground he is not really elevating at all in fact the chair does not even have a reclining function therefore it is basically just him sitting with his feet on the ground. I think this alone has probably led to his increased swelling he is also having more difficulty walking which means that he is pumping less blood flow through his legs anyway as far as the venous stasis is concerned. All in all I think this is leading to a downward spiral of him not being able to walk very well as his legs are so heavy, they begin to weep, and overall he just does not seem to be doing as well as it was when I last saw him. 08/27/2020 upon evaluation today patient's legs actually are showing signs of improvement which is great news. There does not appear to be any evidence of infection which is also excellent news. I am extremely pleased with where things stand today. No fevers, chills, nausea, vomiting, or diarrhea. 09/03/20 upon evaluation today patient appears to be doing excellent in regard to his wounds currently. He has been tolerating the dressing changes without complication and overall I am extremely pleased with where things  stand today. No fevers, chills, nausea, vomiting, or diarrhea. Overall he is healed on the right leg although the left leg not being completely healed still is doing significantly better. Adam Merritt, Adam Merritt ([id number]) 09/10/2020/8/22 upon evaluation today patient appears to be doing well with regard to his wounds. He has been tolerating the dressing changes without complication. Fortunately he appears to be completely healed. I am very pleased in that regard. As is the patient his right leg is also doing well he did get a new recliner which is helping him keep his feet off the ground which I think is helped he is down 2 cm on the left compared to last week so I think there is some definite improvement  here. Readmission 12/15/20: Mr. Izaiha Lo is a 75 year old male with a past medical history of lymphedema, insulin-dependent type 2 diabetes, COPD and congestive heart failure that presents to the clinic for increased drainage from his legs bilaterally, Left greater than right. Patient has lymphedema. He has been seen in our clinic on multiple occasions for this issue. He currently denies signs of infection. He has not been using his lymphedema pumps. 12/23/2020 upon evaluation today patient appears to be doing well with regard to his wounds. He has been tolerating the dressing changes he really does not have any open wounds just weeping areas of the legs bilaterally and fortunately seems to be doing better at this point. 12/28/2020 upon evaluation today patient's wound is actually showing signs of good improvement. There does not appear to be any evidence of infection which is great news and overall I am extremely pleased at this point. I think is very close to complete resolution. 01/04/2021 upon evaluation today patient actually appears to be completely healed which is great news. I do not see any signs of active infection currently which is also great news. In general I am extremely pleased with where we stand and I do believe that the patient is tolerating the dressing changes without complication. I believe he is ready to go back into his Velcro compression and I did see that they brought them with him today. Readmission: Patient presents today for follow-up he was last seen August 2. He is having some issues with some mild drainage and leaking from the left leg. This is something that has reopened and again is a recurrent issue for him. Subsequently he does wear his Velcro compression wraps though I think we may want to look into something a little bit better for him I am thinking that we may want to try the juxta fit compression which I think would do much more effective compression treatment  overall for him. 04/08/2021 upon evaluation today patient appears to be doing well with regard to his legs. I do feel like he is showing signs of improvement which is great news. No fevers, chills, nausea, vomiting, or diarrhea. 04/15/2021 upon evaluation today patient's wounds actually appear to be very close to complete resolution. He is legs are actually doing well left leg just has a small area of leaking at this point. Overall I think that we are very close to complete resolution based on what I am seeing. Nonetheless he seems to not be feeling too well today I really do not know what exactly is going on here. 04/22/2021 upon evaluation patient appears to be completely healed based on what I see today. I do not see anything open both legs appear to be doing quite well. 05/20/2021 upon evaluation today patient appears  to be doing poorly in regard to his legs. He has not been a full month since we last saw him and he has reopened unfortunately. He does not appear to be any signs of active infection at this time which is great news. No fevers, chills, nausea, vomiting, or diarrhea. 12/27; the patient comes in the clinic today with severe bilateral lymphedema very dry skin but paradoxically his wounds are actually healed or should I say not open. He has compression pumps that he does not use. [Once per week] he does not have compression stockings. He comes in today with a small presumably pressure ulcer on the lateral plantar heel on the right. The patient states he has been complaining about pain in this area for about 2 months although no wound was recognized until our intake nurse was doing her exam Electronic Signature(s) Signed: 05/31/2021 4:21:37 PM By: Linton Ham MD Entered By: Linton Ham on 05/31/2021 14:08:30 Adam Merritt ([id number]) -------------------------------------------------------------------------------- Physical Exam Details Patient Name: Adam Merritt Date of  Service: 05/31/2021 12:45 PM Medical Record Number: [id number] Patient Account Number: [id number] Date of Birth/Sex: 1946-02-21 (75 y.o. M) Treating RN: Levora Dredge Primary Care Provider: [name] Other Clinician: Referring Provider: [name] Treating Provider/Extender: Tito Dine in Treatment: 9 Constitutional Sitting or standing Blood Pressure is within target range for patient.. Pulse regular and within target range for patient.Marland Kitchen Respirations regular, non- labored and within target range.. Temperature is normal and within the target range for the patient.Marland Kitchen appears in no distress. Notes Wound exam; no obvious open areas on either lower leg. However he has dry flaking skin with some eschar I did not remove all of this. o He has a small oval-shaped wound on the plantar right heel laterally. This is a full-thickness wound albeit shallow. Surface of this does not look too bad there is no obvious infection Electronic Signature(s) Signed: 05/31/2021 4:21:37 PM By: Linton Ham MD Entered By: Linton Ham on 05/31/2021 14:09:54 Adam Merritt ([id number]) -------------------------------------------------------------------------------- Physician Orders Details Patient Name: Adam Merritt Date of Service: 05/31/2021 12:45 PM Medical Record Number: [id number] Patient Account Number: [id number] Date of Birth/Sex: 03/02/1946 (75 y.o. M) Treating RN: Levora Dredge Primary Care Provider: [name] Other Clinician: Referring Provider: [name] Treating Provider/Extender: Tito Dine in Treatment: 9 Verbal / Phone Orders: No Diagnosis Coding Follow-up Appointments o Return Appointment in 1 week. o Nurse Visit as needed Bathing/ Shower/ Hygiene o May shower with wound dressing protected with water repellent cover or cast protector. o No tub bath. Anesthetic (Use 'Patient Medications' Section for Anesthetic Order Entry) o Lidocaine  applied to wound bed Edema Control - Lymphedema / Segmental Compressive Device / Other o Optional: One layer of unna paste to top of compression wrap (to act as an anchor). o 4 Layer Compression System Lymphedema. - left lower leg, apply TCA and lotion to lower leg o Elevate, Exercise Daily and Avoid Standing for Long Periods of Time. o Elevate legs to the level of the heart and pump ankles as often as possible o Elevate leg(s) parallel to the floor when sitting. Wound Treatment Wound #43 - Calcaneus Wound Laterality: Right Cleanser: Soap and Water 1 x Per Week/30 Days Discharge Instructions: Gently cleanse wound with antibacterial soap, rinse and pat dry prior to dressing wounds Topical: Triamcinolone Acetonide Cream, 0.1%, 15 (g) tube 1 x Per Week/30 Days Discharge Instructions: Apply as directed by provider. Primary Dressing: Silvercel Small 2x2 (in/in) 1 x Per Week/30  Days Discharge Instructions: Apply Silvercel Small 2x2 (in/in) as instructed Secondary Dressing: ABD Pad 5x9 (in/in) 1 x Per Week/30 Days Discharge Instructions: Cover with ABD pad Compression Wrap: Medichoice 4 layer Compression System, 35-40 mmHG 1 x Per Week/30 Days Discharge Instructions: Apply multi-layer wrap as directed. Electronic Signature(s) Signed: 06/01/2021 1:20:37 PM By: Donnamarie Poag Signed: 06/03/2021 4:08:28 PM By: Linton Ham MD Previous Signature: 05/31/2021 4:21:37 PM Version By: Linton Ham MD Entered By: Donnamarie Poag on 05/31/2021 16:24:41 Adam Merritt ([id number]) -------------------------------------------------------------------------------- Problem List Details Patient Name: Adam Merritt Date of Service: 05/31/2021 12:45 PM Medical Record Number: [id number] Patient Account Number: [id number] Date of Birth/Sex: April 17, 1946 (75 y.o. M) Treating RN: Levora Dredge Primary Care Provider: [name] Other Clinician: Referring Provider: [name] Treating Provider/Extender:  Tito Dine in Treatment: 9 Active Problems ICD-10 Encounter Code Description Active Date MDM Diagnosis I89.0 Lymphedema, not elsewhere classified 03/25/2021 No Yes I87.2 Venous insufficiency (chronic) (peripheral) 03/25/2021 No Yes L97.812 Non-pressure chronic ulcer of other part of right lower leg with fat layer 03/25/2021 No Yes exposed L97.822 Non-pressure chronic ulcer of other part of left lower leg with fat layer 03/25/2021 No Yes exposed L89.612 Pressure ulcer of right heel, stage 2 05/31/2021 No Yes Inactive Problems Resolved Problems Electronic Signature(s) Signed: 05/31/2021 4:21:37 PM By: Linton Ham MD Entered By: Linton Ham on 05/31/2021 14:06:34 Adam Merritt ([id number]) -------------------------------------------------------------------------------- Progress Note Details Patient Name: Adam Merritt Date of Service: 05/31/2021 12:45 PM Medical Record Number: [id number] Patient Account Number: [id number] Date of Birth/Sex: 1945/10/01 (75 y.o. M) Treating RN: Levora Dredge Primary Care Provider: [name] Other Clinician: Referring Provider: [name] Treating Provider/Extender: Tito Dine in Treatment: 9 Subjective History of Present Illness (HPI) 10/18/17-He is here for initial evaluation of bilateral lower extremity ulcerations in the presence of venous insufficiency and lymphedema. He has been seen by vascular medicine in the past, Dr. Lucky Cowboy, last seen in 2016. He does have a history of abnormal ABIs, which is to be expected given his lymphedema and venous insufficiency. According to Epic, it appears that all attempts for arterial evaluation and/or angiography were not follow through with by patient. He does have a history of being seen in lymphedema clinic in 2018, stopped going approximately 6 months ago stating "it didn't do any good". He does not have lymphedema pumps, he does not have custom fit compression  wrap/stockings. He is diabetic and his recent A1c last month was 7.6. He admits to chronic bilateral lower extremity pain, no change in pain since blister and ulceration development. He is currently being treated with Levaquin for bronchitis. He has home health and we will continue. 10/25/17-He is here in follow-up evaluation for bilateral lower extremity ulcerationssubtle he remains on Levaquin for bronchitis. Right lower extremity with no evidence of drainage or ulceration, persistent left lower extremity ulceration. He states that home health has not been out since his appointment. He went to [facility] on Tuesday, studies revealed: RIGHT ABI 0.9, TBI 0.6 LEFT ABI 1.1, TBI 0.6 with triphasic flow bilaterally. We will continue his same treatment plan. He has been educated on compression therapy and need for elevation. He will benefit from lymphedema pumps 11/01/17-He is here in follow-up evaluation for left lower extremity ulcer. The right lower extremity remains healed. He has home health services, but they have not been out to see the patient for 2-3 weeks. He states it home health physical therapy changed his dressing yesterday after therapy; he  placed Ace wrap compression. We are still waiting for lymphedema pumps, reordered d/t need for company change. 11/08/17-He is here in follow-up evaluation for left lower extremity ulcer. It is improved. Edema is significantly improved with compression therapy. We will continue with same treatment plan and he will follow-up next week. No word regarding lymphedema pumps 11/15/17-He is here in follow-up evaluation for left lower extremity ulcer. He is healed and will be discharged from wound care services. I have reached out to [facility] regarding his lymphedema pumps. They have been unable to reach the patient; the contact number they had with the patient's wife's cell phone and she has not answered any unrecognized calls. Contact should  be made today, trial planned for next week; [facility] will continue to follow 11/27/17 on evaluation today patient has multiple blistered areas over the right lower extremity his left lower extremity appears to be doing okay. These blistered areas show signs of no infection which is great news. With that being said he did have some necrotic skin overlying which was mechanically debrided away with saline and gauze today without complication. Overall post debridement the wounds appear to be doing better but in general his swelling seems to be increased. This is obviously not good news. I think this is what has given rise to the blisters. 12/04/17 on evaluation today patient presents for follow-up concerning his bilateral lower extremity edema in the right lower extremity ulcers. He has been tolerating the dressing changes without complication. With that being said he has had no real issues with the wraps which is also good news. Overall I'm pleased with the progress he's been making. 12/11/17 on evaluation today patient appears to be doing rather well in regard to his right lateral lower extremity ulcer. He's been tolerating the dressing changes without complication. Fortunately there does not appear to be any evidence of infection at this time. Overall I'm pleased with the progress that is being made. Unfortunately he has been in the hospital due to having what sounds to be a stomach virus/flu fortunately that is starting to get better. 12/18/17 on evaluation today patient actually appears to be doing very well in regard to his bilateral lower extremities the swelling is under fairly good control his lymphedema pumps are still not up and running quite yet. With that being said he does have several areas of opening noted as far as wounds are concerned mainly over the left lower extremity. With that being said I do believe once he gets lymphedema pumps this would least help you mention some the fluid  and preventing this from occurring. Hopefully that will be set up soon sleeves are Artie in place at his home he just waiting for the machine. 12/25/17 on evaluation today patient actually appears to be doing excellent in fact all of his ulcers appear to have resolved his legs appear very well. I do think he needs compression stockings we have discussed this and they are actually going to go to [place] today to elastic therapy to get this fitted for him. I think that is definitely a good thing to do. Readmission: 04/09/18 upon evaluation today this patient is seen for readmission due to bilateral lower extremity lymphedema. He has significant swelling of his extremities especially on the left although the right is also swollen he has weeping from both sides. There are no obvious open wounds at this point. Fortunately he has been doing fairly well for quite a bit of time since I last saw  him. Nonetheless unfortunately this seems to have reopened and is giving quite a bit of trouble. He states this began about a week ago when he first called [place] to get in to be seen. No fevers, chills, nausea, or vomiting noted at this time. He has not been using his lymphedema pumps due to the fact that they won't fit on his leg at this point likewise is also not been using his compression for essentially the same reason. 04/16/18 upon evaluation today patient actually appears to be doing a little better in regard to the fluid in his bilateral lower extremities. With that being said he's had three falls since I saw him last week. He also states that he's been feeling very poorly. I was concerned last week and feel like that the concern is still there as far as the congestion in his chest is concerned he seems to be breathing about the same as last week but again he states he's very weak he's not even able to walk further than from the chair to the door. His wife had to buy a wheelchair just to be able to get them out of  the house to get to the appointment today. This has me very concerned. 04/23/18 on evaluation today patient actually appears to be doing much better than last week's evaluation. At that time actually had to transport him to the ER via EMS and he subsequently was admitted for acute pulmonary edema, acute renal failure, and acute congestive heart failure. Fortunately he is doing much better. Apparently they did dialyze him and were able to take off roughly 35 pounds of fluid. Nonetheless he is feeling much better both in regard to his breathing and he's able to get around much better at this time compared to previous. Overall I'm very Adam Merritt, Adam Merritt ([id number]) happy with how things are at this time. There does not appear to be any evidence of infection currently. No fevers, chills, nausea, or vomiting noted at this time. 04/30/2018 patient seen today for follow-up and management of bilateral lower extremity lymphedema. He did express being more sad today than usual due to the recent loss of his dog. He states that he has been compliant with using the lymphedema pumps. However he does admit a minute over the last 2-3 days he has not been using the pumps due to the recent loss of his dog. At this time there is no drainage or open wounds to his lower extremities. The left leg edema is measuring smaller today. Still has a significant amount of edema on bilateral lower extremities With dry flaky skin. He will be referred to the lymphedema clinic for further management. Will continue 3 layer compression wraps and follow-up in 1-2 weeks.Denies any pain, fever, chairs recently. No recent falls or injuries reported during this visit. 05/07/18 on evaluation today patient actually appears to be doing very well in regard to his lower extremities in general all things considered. With that being said he is having some pain in the legs just due to the amount of swelling. He does have an area where he had a blister  on the left lateral lower extremity this is open at this point other than that there's nothing else weeping at this time. 05/14/18 on evaluation today patient actually appears to be doing excellent all things considered in regard to his lower extremities. He still has a couple areas of weeping on each leg which has continued to be the issue for him. He does  have an appointment with the lymphedema clinic although this isn't until February 2020. That was the earliest they had. In the meantime he has continued to tolerate the compression wraps without complication. 05/28/18 on evaluation today patient actually appears to be doing more poorly in regard to his left lower extremity where he has a wound open at this point. He also had a fall where he subsequently injured his right great toe which has led to an open wound at the site unfortunately. He has been tolerating the dressing changes without complication in general as far as the wraps are concerned that he has not been putting any dressing on the left 1st toe ulcer site. 06/11/18 on evaluation today patient appears to be doing much worse in regard to his bilateral lower extremity ulcers. He has been tolerating the dressing changes without complication although his legs have not been wrapped more recently. Overall I am not very pleased with the way his legs appear. I do believe he needs to be back in compression wraps he still has not received his compression wraps from the [facility] hospital as of yet. 06/18/18 on evaluation today patient actually appears to be doing significantly better than last time I saw him. He has been tolerating the compression wraps without complication in the circumferential ulcers especially appear to be doing much better. His toe ulcer on the right in regard to the great toe is better although not as good as the legs in my pinion. No fevers chills noted 07/02/18 on evaluation today patient appears to be doing much better in regard to  his lower extremity ulcers. Unfortunately since I last saw him he's had the distal portion of his right great toe if he dated it sounds as if this actually went downhill very quickly. I had only seen him a few days prior and the toe did not appear to be infected at that point subsequently became infected very rapidly and it was decided by the surgeon that the distal portion of the toe needed to be removed. The patient seems to be doing well in this regard he tells me. With that being said his lower extremities are doing better from the standpoint of the wounds although he is significantly swollen at this point. 07/09/18 on evaluation today patient appears to be doing better in regard to the wounds on his lower extremities. In fact everything is almost completely healed he is just a small area on the left posterior lower extremity that is open at this point. 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 With that being said he needs some kind of compression it does sound like he may not be wearing his compression, that is the wraps, during the entire time between when he's here visit to visit. Apparently his wife took the current one off because it began to "fall apart". 07/16/18 on evaluation today patient appears to be extremely swollen especially in regard to his left lower extremity unfortunately. He also has a new skin tear over the left lower extremity and there's a smaller area on the right lower extremity as well. Unfortunately this seems to be due in part to blistering and fluid buildup in his leg. He did get the reduction wraps that were ordered by the [facility] hospital for him to go to lymphedema clinic. With that being  said his wounds on the legs have not healed to the point to where they would likely accept them as a  patient lymphedema clinic currently. We need to try to get this to heal. With that being said he's been taking his wraps off which is not doing him any favors at this point. In fact this is probably quite counterproductive compared to what needs to occur. We will likely need to increase to a four layer compression wrap and continue to also utilize elevation and he has to keep the wraps on not take them off as he's been doing currently hasn't had a wrap on since Saturday. 07/23/18 on evaluation today patient appears to be doing much better in regard to his bilateral lower extremities. In fact his left lower extremity which was the largest is actually 15 cm smaller today compared to what it was last time he was here in our clinic. This is obviously good news after just one week. Nonetheless the differences he actually kept the wraps on during the entire week this time. That's not typical for him. I do believe he understands a little bit better now the severity of the situation and why it's important for him to keep these wraps on. 07/30/18 on evaluation today patient actually appears to be doing rather well in regard to his lower extremities. His legs are much smaller than they have been in the past and he actually has only one very small rudder superficial region remaining that is not closed on the left lateral/posterior lower extremity even this is almost completely close. I do believe likely next week he will be healed without any complications. I do think we need to continue the wraps however this seems to be beneficial for him. I also think it may be a good time for [place] to go ahead and see about getting the appointment with the lymphedema clinic which is supposed to be made for him in order to keep this moving along and hopefully get them into compression wraps that will in the end help him to remain healed. 08/06/18 on evaluation today patient actually appears to be doing very well in regard as  bilateral lower extremities. In fact his wounds appear to be completely healed at this time. 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 He is in the process of getting appointment with the lymphedema clinic we have made this referral were just waiting to hear back on the schedule time. We need to follow up on that today as well. 08/13/18 on evaluation today patient actually appears to be doing very well in regard to his bilateral lower extremities there are no open wounds at this point. We are gonna go ahead and see about ordering the Velcro compression wraps for him had a discussion with them about [place] doing it versus the [place] they feel like they can definitely afford going ahead and get the wraps themselves and they would prefer to try to avoid having to go to the lymphedema clinic if it all possible which I completely understand. As long as he has good compression I'm okay either way. 3/17//20 on evaluation today patient actually appears to be doing well in regard to his bilateral lower extremity ulcers. He has been tolerating the dressing changes without complication specifically the compression wraps. Overall is had no issues my fingers finding that I see at this point is that he is having trouble with constipation. He tells me he has  not been able to go to the bathroom for about six days. He's taken to over-the- counter oral laxatives unfortunately this is not helping. He has not contacted his doctor. Adam Merritt, Adam Merritt ([id number]) 08/27/18 on evaluation today patient appears to be doing fairly well in regard to his lower extremities at this point. There does not appear to be any new altars is swelling is very well controlled. We are still waiting for his Velcro compression wraps to arrive that should be sometime in the next week is Artie sent the check for these. 09/03/18 on evaluation today patient appears to be doing excellent in regard to  his bilateral lower extremities which he shows no signs of wound openings in regard to this point. He does have his Velcro compression wraps which did arrive in the mail since I saw him last week. Overall he is doing excellent in my pinion. 09/13/18 on evaluation today patient appears to be doing very well currently in regard to the overall appearance of his bilateral lower extremities although he's a little bit more swollen than last time we saw him. At that point he been discharged without any open wounds. Nonetheless he has a small open wound on the posterior left lower extremity with some evidence of cellulitis noted as well. Fortunately I feel like he has made good progress overall with regard to his lower extremities from were things used to be. 09/20/18 on evaluation today patient actually appears to be doing much better. The erythematous lower extremity is improving wound itself which is still open appears to be doing much better as far as but appearance as well as pain is concerned overall very pleased in this regard. There's no signs of active infection at this time. 09/27/18 on evaluation today patient's wounds on the lower extremity actually appear to be doing fairly well at this time which is good news. There is no evidence of active infection currently and again is just as left lower extremity were there any wounds at this point anyway. I believe they may be completely healed but again I'm not 100% sure based on evaluation today. I think one more week of observation would likely be a good idea. 10/04/18 on evaluation today patient actually appears to be doing excellent in regard to the left lower extremity which actually appears to be completely healed as of today. Unfortunately he's been having issues with his right lower extremity have a new wound that has opened. Fortunately there's no evidence of active infection at this time which is good news. 10/10/18 upon evaluation today patient  actually appears to be doing a little bit worse with the new open area on his right posterior lower extremity. He's been tolerating the dressing changes without complication. Right now we been using his compression wraps although I think we may need to switch back to actually performing bilateral compression wraps are in the clinic. No fevers, chills, nausea, or vomiting noted at this time. 10/17/18 on evaluation today patient actually appears to be doing quite well in regard to his bilateral lower extremity ulcers. He has been tolerating the reps without complication although he would prefer not to rewrap his legs as of today. Fortunately there's no signs of active infection which is good news. No fevers, chills, nausea, or vomiting noted at this time. 10/24/18 on evaluation today patient appears to be doing very well in regard to his lower extremities. His right lower extremity is shown signs of healing and his left lower Trinity though not  healed appears to be improving which is excellent news. Overall very pleased with how things seem to be progressing at this time. The patient is likewise happy to hear this. His Velcro compression wraps however have not been put on properly will gonna show his wife how to do that properly today. 10/31/18 on evaluation today patient appears to be doing more poorly in regard to his left lower extremity in particular although both lower extremities actually are showing some signs of being worse than my previous evaluation. Unfortunately I'm just not sure that his compression stockings even with the use of the compression/lymphedema pumps seem to be controlling this well. Upon further questioning he tells me that he also is not able to lie flat in the bed due to his congestive heart failure and difficulty breathing. For that reason he sleeps in his recliner. To make matters worse his recliner also cannot even hold his legs up so instead of even being somewhat elevated  they pretty much hang down to the floor. This is the way he sleeps each night which is definitely counterproductive to everything else that were attempting to do from the standpoint of controlling his fluid. Nonetheless I think that he potentially could benefit from a hospital bed although this would be something that his primary care provider would likely have to order since anything that is order on our side has to be directly related to wound care and again the hospital bed is not necessarily a direct relation to although I think it does contribute to his overall wound status on his lower extremities. 11/07/18 on evaluation today patient appears to be doing better in regard to his bilateral lower extremity. He's been tolerating the dressing changes without complication. We did in the interim since I last saw him switch to just using extras orbiting new alginate and that seems to have done much better for him. I'm very pleased with the overall progress that is made. 11/14/18 on evaluation today patient appears to be redoing rather well in regard to his left lower extremity ulcers which are the only ones remaining at this point. Fortunately there's no signs of active infection at this time which is good news. Overall been very pleased with how things seem to be progressing currently. No fevers, chills, nausea, or vomiting noted at this time. 11/20/17 on evaluation today patient actually appears to be doing quite well in regard to his lower extremities on the left on the right he has several blisters that showed up although there's some question about whether or not he has had his broker compression wraps on like he was supposed to or not. Fortunately there's no signs of active infection at this time which is good news. Unfortunately though he is doing well from the standpoint of the left leg the right leg is not doing as well again this is when we did not wrap last week. 11/28/18 on evaluation today  patient appears to be doing well in regard to his bilateral lower extremities is left appears to be healed is right is not healed but is very close to being so. Overall very pleased with how things seem to be progressing. Patient is likewise happy that things are doing well. 12/09/18 on evaluation today patient actually appears to be completely healed which is excellent news. He actually seems to be doing well in regard to the swelling of the bilateral lower extremities which is also great news. Overall very pleased with how things seem to be progressing.  Readmission: 03/18/2019 upon evaluation today patient presents for reevaluation concerning issues that he is having with his left lower extremity where he does have a wound noted upon inspection today. He also has a wound on the right second toe on the tip where it is apparently been rubbing in his shoe I did look at issues and you can actually see where this has been occurring as well. Fortunately there is no signs of infection with regard to the toe necessarily although it is very swollen compared to normal that does have me somewhat concerned about the possibility of further evaluation for infection/osteomyelitis. I would recommend an x-ray to start with. Otherwise he states that it is been 1-2 weeks that he has had the draining in regard to left lower extremity he does not know how this happened he has been wearing his compression appropriately and I do see that as well today but nonetheless I do think that he needs to continue to be very cautious with regard to elevation as well. Adam Merritt, Adam Merritt ([id number]) 03/25/2019 on evaluation today patient appears to be doing much better in regard to his lower extremity at this time. That is on the left. He did culture positive for Pseudomonas but the good news is he seems to be doing much better the gentamicin we applied topically seems to have done a great job for him. There is no signs of active  infection at this time systemically and locally he is doing much better. No fevers, chills, nausea, vomiting, or diarrhea. In regard to his right toe this seems to be doing much better there is very little pressure noted at this point I did clean up some of the callus today around the edges of the wound as well as the surface of the wound but to be honest he is progressing quite nicely. 10/30; the area on the left anterior lower tibia area is healed over. His edema control is adequate even though his use of the compression pumps seems very intermittent. He has a juxta lite stocking on the right leg and a think there is 1 for the left leg at home. His wife is putting these on. He has an area on the plantar right second toe which is a hammertoe they are trying to offload this 04/08/2019 on evaluation today patient appears to be doing well in regard to his lower extremities which is good news. We are seeing him today for his toe ulcer which is giving him a little bit more trouble but again still seems to be doing better at this point which is good news. He is going require some sharp debridement in regard to the toe today however. 04/15/2019 on evaluation today patient actually appears to be doing quite well with regard to his toe ulcer. I am very pleased with how things are doing in that regard. With regard to his lower extremity edema in general he tells me he is not been using the lymphedema pumps regularly although he does not use them. I think that he needs to use them more regularly he does have a small blister on the left lower extremity this is not open at this point but obviously it something that can get worse if he does not keep the compression going and the pumps going as well. 04/22/2019 on evaluation today patient appears to be doing well with regard to his toe ulcer. He has been tolerating the dressing changes without complication. This is measuring slightly smaller this week as  compared to  last week. Fortunately there is no signs of active infection at this time. 05/06/2019 on evaluation today patient actually appears to be doing excellent. In fact his toe appears to be completely healed which is great news. There is no signs of active infection at this time. Readmission: Patient presents today for follow-up after having been discharged at the beginning of December 2020. He states that in recent weeks things have reopened and has been having more trouble at this point. With that being said fortunately there is no signs of active infection at this time. No fever chills noted. Nonetheless he also does have an issue with one of his toes as well that seems to be somewhat this is on the right foot second toe. 07/25/2019 upon evaluation today patient's lower extremities appear to be doing excellent bilaterally. I really feel like he is showing signs of great improvement and in fact I think he is close to healing which is great news. There is no signs of active infection at this time. No fevers, chills, nausea, vomiting, or diarrhea. 07/31/2019 upon evaluation today patient appears to be doing excellent and in fact I think may be completely healed in regard to his right lower extremity that were still to monitor this 1 more week before closing it out. The left lower extremity is measuring smaller although not completely closed seems to be doing excellent. 08/07/2019 upon evaluation today patient appears to be doing excellent in regard to his wounds. In fact the right lower extremity is completely healed there is no issues here. The left lower extremity has 1 very small area still remaining fortunately there is no signs of infection and overall I think he is very close to closure of the here as well. 08/14/19 upon evaluation today patient appears to be doing excellent in regard to his lower extremities. In fact he appears to be completely healed as of today. Fortunately there's no signs of active  infection at this time. No fevers, chills, nausea, or vomiting noted at this time. READMISSION 09/26/2019 This is a 75 year old man who is well-known to this clinic having just been discharged on 08/14/2019. He has known lymphedema and chronic venous insufficiency. He has Farrow wrap stockings and also external compression pumps. He does not use the external compression pumps however. He does however apparently fairly faithfully use the stockings. They have not been lotion in his legs. He has developed new wounds on the right anterior as well as the left medial left lateral and left posterior calf. He returns for our review of this Past medical history includes coronary artery disease, hypertension, congestive heart failure, COPD, venous insufficiency with lymphedema, type 2 diabetes. He apparently has compression pumps, Farrow wraps and at 1 point a hospital bed although I believe he sleeps in a recliner 10/03/2019 upon evaluation today patient appears to be doing better with regard to his wounds in general. He has been tolerating the dressing changes without complication. Fortunately there is no signs of active infection. No fevers, chills, nausea, vomiting, or diarrhea. I do believe the compression wraps are helping as they always have in the past. 10/10/2019 upon evaluation today patient appears to be doing excellent at this point. His right lower extremity is measuring much better and in fact is completely healed. His left lower extremity is also measuring better though not healed seems to be doing excellent at this point which is great news. Overall I am very pleased with how things appear currently. 10/27/2019 upon  evaluation today patient appears to be doing quite well with regard to his wounds currently. He has been tolerating the dressing changes without complication. Fortunately there is no signs of active infection at this time. In fact he is almost completely healed on the left and has just a  very small area open and on the right he is completely healed. 11/04/2019 upon evaluation today patient appears to be doing quite well with regard to his wounds. In fact he appears to be quite possibly completely healed on the left lower extremity now as well the right lower extremity is still doing well. With that being said unfortunately though this may be healed I think it is a very fragile area and I cannot even confirm there is not a very small opening still remaining. I really think he may benefit from 1 additional weeks wrapping before discontinuing. 11/10/2019 upon evaluation today patient appears to be doing well in regard to his original wounds in fact everything that we were treating last week is completely healed on the left. Unfortunately he has a new skin tear just above where the wrap slipped down due to a fall he sustained causing this injury. 11/17/2019 on evaluation today patient appears to be doing better with regard to his wound. He has been tolerating the dressing changes without Adam Merritt, Adam Merritt. ([id number]) complication. Fortunately there is no signs of active infection at this time. No fevers, chills, nausea, vomiting, or diarrhea. 11/24/2019 upon evaluation today patient appears to be doing well with regard to his wound. This is making good progress there does not appear to be signs of active infection but overall I do feel like he is headed in the correct direction. Overall he is tolerating the compression wrap as well. 12/02/2019 upon evaluation today patient appears to be doing well with regard to his leg ulcer. In fact this appears to be completely healed and this is the last of the wounds that he had but still the left anterior lower extremity. Fortunately there is no signs of active infection at this time. No fevers, chills, nausea, vomiting, or diarrhea. Readmission: 02/05/2020 on evaluation today patient appears to be doing well with regard to his wound all things considered.  He actually tells me that a couple weeks ago he had trouble with his wraps and therefore took him off for a couple of days in order to give his legs a break. It was during this time that he actually developed increased swelling and edema of his legs and blisters on both legs. That is when he called to make the appointment. Nonetheless since that time he began wearing his compression wraps which are Velcro in the meantime and has done extremely well with this. In fact his leg appears to be under good control as far as edema is concerned today it is nothing like what I am seeing when he has come in for evaluations in the past. They have been using some of the silver alginate at home which has been beneficial for him. Fortunately there is no signs of active infection at this time. No fevers, chills, nausea, vomiting, or diarrhea. 02/19/2020 on evaluation today patient unfortunately does not appear to be doing quite as well as I would like to see. He has no signs of active infection at this time but unfortunately he is not doing well in regard to the overall appearance of his left leg. He has a couple other areas that are weeping now and the leg is much  more swollen. I think that he needs to actually have a compression wrap to try to manage this. 02/26/2020 on evaluation today patient appears to be doing well with regard to his left lower extremity ulcer. Fortunately the swelling is down I do believe the pressure wrap seems to be doing quite well. Fortunately there is no signs of active infection at this time. 03/05/2020 upon evaluation today patient appears to be doing excellent in regard to his legs at this point. Fortunately there is no signs of active infection which is great news. Overall he feels like he is completely healed which is great news. Readmission: 05/20/2020 upon evaluation today patient appears to be doing well with regard to his leg ulcer all things considered. He is actually coming back  to see [place] after having had a reopening of the ulcers on his left leg in the past several weeks. He is out of dressing supplies and his wife is been try to take care of this as best she can. 06/10/2020 upon evaluation today patient unfortunately appears to be doing significantly worse today compared to when I last saw him. He and his wife both tell me that "there has been a lot going on". I did not actually go into detail about everything that has been happening but nonetheless he has not obviously been wearing his compression. He brings them with him today but they were not on. I am not even certain to be honest that he could get those on at this point. Nonetheless I do believe that he is going to require compression wrapping at some point shortly but right now we need to try to get what appears to be infection under control at this time. 06/28/2020 upon evaluation today patient appears to be doing well with regard to 06/28/2020 upon evaluation today patient appears to be doing well pretty well in regard to his left leg which is much better than previous. With that being said in regard to his right leg he does seem to be having some issues with a new wound after he sustained a fall. This fortunately is not too deep but does appear to be something we need to address. Neither deep which is good. 07/05/2020 on evaluation today patient appears to be doing well with regard to his wounds. In fact everything on the left leg appears to be pretty much healed on the right leg this is very close though not completely closed yet. Fortunately there is no evidence of active infection at this time. No fevers, chills, nausea, vomiting, or diarrhea. 07/13/2019 upon evaluation today patient appears to be making good progress which is great news. Overall I am extremely pleased with where things stand today. There does not appear to be any signs of active infection which is great news and overall his left leg appears healed  still the right leg though not healed does seem to be making good progress which is excellent news. He is having no pain. 07/19/2020 on evaluation today patient appears to be doing well with regard to his wound. He has been tolerating the dressing changes without complication. Fortunately there does not appear to be any signs of active infection at this time. No fevers, chills, nausea, vomiting, or diarrhea. Readmission: 08/20/2020 on evaluation today patient presents for reevaluation here in the clinic concerning issues that he has been having with his bilateral lower extremities. Unfortunately this is an ongoing reoccurring issue. He tells me that he is really not certain exactly what caused the  issue although as I discussed this further with him it appears that he has been sitting for the most part and sleeping in his chair with his feet on the ground he is not really elevating at all in fact the chair does not even have a reclining function therefore it is basically just him sitting with his feet on the ground. I think this alone has probably led to his increased swelling he is also having more difficulty walking which means that he is pumping less blood flow through his legs anyway as far as the venous stasis is concerned. All in all I think this is leading to a downward spiral of him not being able to walk very well as his legs are so heavy, they begin to weep, and overall he just does not seem to be doing as well as it was when I last saw him. 08/27/2020 upon evaluation today patient's legs actually are showing signs of improvement which is great news. There does not appear to be any evidence of infection which is also excellent news. I am extremely pleased with where things stand today. No fevers, chills, nausea, vomiting, or diarrhea. 09/03/20 upon evaluation today patient appears to be doing excellent in regard to his wounds currently. He has been tolerating the dressing changes without  complication and overall I am extremely pleased with where things stand today. No fevers, chills, nausea, vomiting, or diarrhea. Overall he is healed on the right leg although the left leg not being completely healed still is doing significantly better. 09/10/2020/8/22 upon evaluation today patient appears to be doing well with regard to his wounds. He has been tolerating the dressing changes without complication. Fortunately he appears to be completely healed. I am very pleased in that regard. As is the patient his right leg is also doing well he did get a new recliner which is helping him keep his feet off the ground which I think is helped he is down 2 cm on the left compared to last week so I think there is some definite improvement here. Adam Merritt, Adam Merritt ([id number]) Readmission 12/15/20: [name] is a 75 year old male with a past medical history of lymphedema, insulin-dependent type 2 diabetes, COPD and congestive heart failure that presents to the clinic for increased drainage from his legs bilaterally, Left greater than right. Patient has lymphedema. He has been seen in our clinic on multiple occasions for this issue. He currently denies signs of infection. He has not been using his lymphedema pumps. 12/23/2020 upon evaluation today patient appears to be doing well with regard to his wounds. He has been tolerating the dressing changes he really does not have any open wounds just weeping areas of the legs bilaterally and fortunately seems to be doing better at this point. 12/28/2020 upon evaluation today patient's wound is actually showing signs of good improvement. There does not appear to be any evidence of infection which is great news and overall I am extremely pleased at this point. I think is very close to complete resolution. 01/04/2021 upon evaluation today patient actually appears to be completely healed which is great news. I do not see any signs of active infection currently which is  also great news. In general I am extremely pleased with where we stand and I do believe that the patient is tolerating the dressing changes without complication. I believe he is ready to go back into his Velcro compression and I did see that they brought them with him today. Readmission:  Patient presents today for follow-up he was last seen August 2. He is having some issues with some mild drainage and leaking from the left leg. This is something that has reopened and again is a recurrent issue for him. Subsequently he does wear his Velcro compression wraps though I think we may want to look into something a little bit better for him I am thinking that we may want to try the juxta fit compression which I think would do much more effective compression treatment overall for him. 04/08/2021 upon evaluation today patient appears to be doing well with regard to his legs. I do feel like he is showing signs of improvement which is great news. No fevers, chills, nausea, vomiting, or diarrhea. 04/15/2021 upon evaluation today patient's wounds actually appear to be very close to complete resolution. He is legs are actually doing well left leg just has a small area of leaking at this point. Overall I think that we are very close to complete resolution based on what I am seeing. Nonetheless he seems to not be feeling too well today I really do not know what exactly is going on here. 04/22/2021 upon evaluation patient appears to be completely healed based on what I see today. I do not see anything open both legs appear to be doing quite well. 05/20/2021 upon evaluation today patient appears to be doing poorly in regard to his legs. He has not been a full month since we last saw him and he has reopened unfortunately. He does not appear to be any signs of active infection at this time which is great news. No fevers, chills, nausea, vomiting, or diarrhea. 12/27; the patient comes in the clinic today with severe  bilateral lymphedema very dry skin but paradoxically his wounds are actually healed or should I say not open. He has compression pumps that he does not use. [Once per week] he does not have compression stockings. He comes in today with a small presumably pressure ulcer on the lateral plantar heel on the right. The patient states he has been complaining about pain in this area for about 2 months although no wound was recognized until our intake nurse was doing her exam Objective Constitutional Sitting or standing Blood Pressure is within target range for patient.. Pulse regular and within target range for patient.Marland Kitchen Respirations regular, non- labored and within target range.. Temperature is normal and within the target range for the patient.Marland Kitchen appears in no distress. Vitals Time Taken: 12:43 PM, Height: 66 in, Weight: 300 lbs, BMI: 48.4, Temperature: 97.7 F, Pulse: 64 bpm, Respiratory Rate: 18 breaths/min, Blood Pressure: 134/75 mmHg. General Notes: Wound exam; no obvious open areas on either lower leg. However he has dry flaking skin with some eschar I did not remove all of this. He has a small oval-shaped wound on the plantar right heel laterally. This is a full-thickness wound albeit shallow. Surface of this does not look too bad there is no obvious infection Integumentary (Hair, Skin) Wound #43 status is Open. Original cause of wound was Gradually Appeared. The date acquired was: 05/31/2021. The wound is located on the Right Calcaneus. The wound measures 1cm length x 1.9cm width x 0.2cm depth; 1.492cm^2 area and 0.298cm^3 volume. There is Fat Layer (Subcutaneous Tissue) exposed. There is a medium amount of serosanguineous drainage noted. There is small (1-33%) pink granulation within the wound bed. There is a medium (34-66%) amount of necrotic tissue within the wound bed including Adherent Slough. Adam Merritt, Adam Merritt ([id number]) Assessment  Active Problems ICD-10 Lymphedema, not elsewhere  classified Venous insufficiency (chronic) (peripheral) Non-pressure chronic ulcer of other part of right lower leg with fat layer exposed Non-pressure chronic ulcer of other part of left lower leg with fat layer exposed Pressure ulcer of right heel, stage 2 Plan Follow-up Appointments: Return Appointment in 1 week. Bathing/ Shower/ Hygiene: May shower with wound dressing protected with water repellent cover or cast protector. Edema Control - Lymphedema / Segmental Compressive Device / Other: Optional: One layer of unna paste to top of compression wrap (to act as an anchor). 4 Layer Compression System Lymphedema. - left lower leg, apply TCA and lotion to lower leg Elevate, Exercise Daily and Avoid Standing for Long Periods of Time. Elevate legs to the level of the heart and pump ankles as often as possible Elevate leg(s) parallel to the floor when sitting. WOUND #43: - Calcaneus Wound Laterality: Right Cleanser: Soap and Water 1 x Per Week/30 Days Discharge Instructions: Gently cleanse wound with antibacterial soap, rinse and pat dry prior to dressing wounds Topical: Triamcinolone Acetonide Cream, 0.1%, 15 (g) tube 1 x Per Week/30 Days Discharge Instructions: Apply as directed by provider. Primary Dressing: Silvercel Small 2x2 (in/in) 1 x Per Week/30 Days Discharge Instructions: Apply Silvercel Small 2x2 (in/in) as instructed Secondary Dressing: ABD Pad 5x9 (in/in) 1 x Per Week/30 Days Discharge Instructions: Cover with ABD pad Compression Wrap: Medichoice 4 layer Compression System, 35-40 mmHG 1 x Per Week/30 Days Discharge Instructions: Apply multi-layer wrap as directed. 1. Bilateral lymphedema. We applied TCA moisturizer and put him back in 4-layer compression. I do not believe he has significant arterial disease 2. New wound on the plantar right heel the exact cause of this is unclear. This is not obviously infected. The patient is a minimal ambulator. He wears nonslip's stockings  but only stands to transfer from his wheelchair according to the patient. 3. We applied silver alginate to this wound under compression. I have asked him to use his compression pumps once or twice a day preferably twice Electronic Signature(s) Signed: 05/31/2021 4:21:37 PM By: Linton Ham MD Entered By: Linton Ham on 05/31/2021 14:11:11 Adam Merritt ([id number]) -------------------------------------------------------------------------------- [facility] Details Patient Name: Adam Merritt Date of Service: 05/31/2021 Medical Record Number: [id number] Patient Account Number: [id number] Date of Birth/Sex: 1946-03-31 (75 y.o. M) Treating RN: Levora Dredge Primary Care Provider: [name] Other Clinician: Referring Provider: [name] Treating Provider/Extender: Tito Dine in Treatment: 9 Diagnosis Coding ICD-10 Codes Code Description I89.0 Lymphedema, not elsewhere classified I87.2 Venous insufficiency (chronic) (peripheral) L97.812 Non-pressure chronic ulcer of other part of right lower leg with fat layer exposed L97.822 Non-pressure chronic ulcer of other part of left lower leg with fat layer exposed L89.612 Pressure ulcer of right heel, stage 2 Facility Procedures CPT4: Description Modifier Quantity Code [id number] BILATERAL: Application of multi-layer venous compression system; leg (below knee), including 1 ankle and foot. Physician Procedures CPT4 Code: 7846962 Description: 95284 - WC PHYS LEVEL 3 - EST PT Modifier: Quantity: 1 CPT4 Code: Description: ICD-10 Diagnosis Description X32.440 Pressure ulcer of right heel, stage 2 I89.0 Lymphedema, not elsewhere classified Modifier: Quantity: Electronic Signature(s) Signed: 05/31/2021 4:22:48 PM By: Donnamarie Poag Signed: 06/03/2021 4:08:28 PM By: Linton Ham MD Previous Signature: 05/31/2021 4:21:37 PM Version By: Linton Ham MD Entered By: Donnamarie Poag on 05/31/2021 16:22:47

## 2021-06-04 ENCOUNTER — Encounter: Payer: Self-pay | Admitting: Dermatology

## 2021-06-07 ENCOUNTER — Other Ambulatory Visit: Payer: Self-pay

## 2021-06-07 DIAGNOSIS — R0602 Shortness of breath: Secondary | ICD-10-CM

## 2021-06-09 ENCOUNTER — Encounter: Payer: Medicare PPO | Attending: Physician Assistant

## 2021-06-09 ENCOUNTER — Other Ambulatory Visit: Payer: Self-pay

## 2021-06-09 DIAGNOSIS — M1990 Unspecified osteoarthritis, unspecified site: Secondary | ICD-10-CM | POA: Diagnosis not present

## 2021-06-09 DIAGNOSIS — E1140 Type 2 diabetes mellitus with diabetic neuropathy, unspecified: Secondary | ICD-10-CM | POA: Insufficient documentation

## 2021-06-09 DIAGNOSIS — L97812 Non-pressure chronic ulcer of other part of right lower leg with fat layer exposed: Secondary | ICD-10-CM | POA: Insufficient documentation

## 2021-06-09 DIAGNOSIS — I509 Heart failure, unspecified: Secondary | ICD-10-CM | POA: Insufficient documentation

## 2021-06-09 DIAGNOSIS — I110 Hypertensive heart disease with heart failure: Secondary | ICD-10-CM | POA: Insufficient documentation

## 2021-06-09 DIAGNOSIS — I872 Venous insufficiency (chronic) (peripheral): Secondary | ICD-10-CM | POA: Insufficient documentation

## 2021-06-09 DIAGNOSIS — I890 Lymphedema, not elsewhere classified: Secondary | ICD-10-CM | POA: Diagnosis not present

## 2021-06-09 DIAGNOSIS — L97822 Non-pressure chronic ulcer of other part of left lower leg with fat layer exposed: Secondary | ICD-10-CM | POA: Diagnosis not present

## 2021-06-09 DIAGNOSIS — L89612 Pressure ulcer of right heel, stage 2: Secondary | ICD-10-CM | POA: Insufficient documentation

## 2021-06-09 DIAGNOSIS — E11621 Type 2 diabetes mellitus with foot ulcer: Secondary | ICD-10-CM | POA: Insufficient documentation

## 2021-06-09 DIAGNOSIS — E1151 Type 2 diabetes mellitus with diabetic peripheral angiopathy without gangrene: Secondary | ICD-10-CM | POA: Diagnosis not present

## 2021-06-10 NOTE — Progress Notes (Signed)
Adam Merritt, Adam Merritt ([id number]) Visit Report for 06/09/2021 Arrival Information Details Patient Name: Adam Merritt, Adam Merritt Date of Service: 06/09/2021 3:45 PM Medical Record Number: [id number] Patient Account Number: [id number] Date of Birth/Sex: 1945/12/19 (75 y.o. M) Treating RN: Cornell Barman Primary Care Denzil Bristol: [name] Other Clinician: Referring Zurri Rudden: [name] Treating Rahn Lacuesta/Extender: Skipper Cliche in Treatment: 10 Visit Information History Since Last Visit Added or deleted any medications: No Patient Arrived: Wheel Chair Has Dressing in Place as Prescribed: Yes Arrival Time: 15:41 Has Compression in Place as Prescribed: Yes Transfer Assistance: None Pain Present Now: Yes Patient Identification Verified: Yes Secondary Verification Process Completed: Yes Patient Requires Transmission-Based Precautions: No Patient Has Alerts: Yes Patient Alerts: diabetic type 2 Electronic Signature(s) Signed: 06/10/2021 5:16:08 PM By: Gretta Cool, BSN, RN, CWS, Kim RN, BSN Entered By: Gretta Cool, BSN, RN, CWS, Kim on 06/09/2021 16:11:27 Adam Merritt ([id number]) -------------------------------------------------------------------------------- Compression Therapy Details Patient Name: Adam Merritt Date of Service: 06/09/2021 3:45 PM Medical Record Number: [id number] Patient Account Number: [id number] Date of Birth/Sex: 02-14-1946 (75 y.o. M) Treating RN: Cornell Barman Primary Care Kelly Ranieri: [name] Other Clinician: Referring Colleen Donahoe: [name] Treating Dainelle Hun/Extender: Jeri Cos [name]: 10 Compression Therapy Performed for Wound Assessment: Wound #43 Right Calcaneus Performed By: Clinician Cornell Barman, RN Compression Type: Four Layer Notes Patient tolerating wraps well. Electronic Signature(s) Signed: 06/10/2021 5:16:08 PM By: Gretta Cool, BSN, RN, CWS, Kim RN, BSN Entered By: Gretta Cool, BSN, RN, CWS, Kim on 06/09/2021 16:12:36 Adam Merritt  ([id number]) -------------------------------------------------------------------------------- Encounter Discharge Information Details Patient Name: Adam Merritt, Adam Merritt Date of Service: 06/09/2021 3:45 PM Medical Record Number: [id number] Patient Account Number: [id number] Date of Birth/Sex: 04-17-1946 (75 y.o. M) Treating RN: Cornell Barman Primary Care Dorethia Jeanmarie: [name] Other Clinician: Referring Halo Laski: [name] Treating Verity Gilcrest/Extender: Skipper Cliche in Treatment: 10 Encounter Discharge Information Items Discharge Condition: Stable Ambulatory Status: Wheelchair Discharge Destination: Home Transportation: Private Auto Accompanied By: wife Schedule Follow-up Appointment: Yes Clinical Summary of Care: Electronic Signature(s) Signed: 06/10/2021 5:16:08 PM By: Gretta Cool, BSN, RN, CWS, Kim RN, BSN Entered By: Gretta Cool, BSN, RN, CWS, Kim on 06/09/2021 16:13:30 Adam Merritt ([id number]) -------------------------------------------------------------------------------- Wound Assessment Details Patient Name: Adam Merritt Date of Service: 06/09/2021 3:45 PM Medical Record Number: [id number] Patient Account Number: [id number] Date of Birth/Sex: [birth year] (75 y.o. M) Treating RN: Cornell Barman Primary Care Nilam Quakenbush: [name] Other Clinician: Referring Makaelyn Aponte: [name] Treating Morrison Masser/Extender: Jeri Cos [name]: 10 Wound Status Wound Number: 43 Primary Diabetic Wound/Ulcer of the Lower Extremity Etiology: Wound Location: Right Calcaneus Wound Open Wounding Event: Gradually Appeared Status: Date Acquired: 05/31/2021 Comorbid Anemia, Lymphedema, Chronic Obstructive Pulmonary Weeks Of Treatment: 1 History: Disease (COPD), Sleep Apnea, Congestive Heart Failure, Clustered Wound: No Coronary Artery Disease, Hypertension, Peripheral Venous Disease, Type II Diabetes, Osteoarthritis, Neuropathy, Received Chemotherapy Wound Measurements Length: (cm) 1 Width: (cm)  1.5 Depth: (cm) 0.2 Area: (cm) 1.178 Volume: (cm) 0.236 % Reduction in Area: 21% % Reduction in Volume: 20.8% Epithelialization: Small (1-33%) Wound Description Classification: Grade 1 Exudate Amount: Medium Exudate Type: Serosanguineous Exudate Color: red, brown Foul Odor After Cleansing: No Slough/Fibrino Yes Wound Bed Granulation Amount: Small (1-33%) Exposed Structure Granulation Quality: Pink Fat Layer (Subcutaneous Tissue) Exposed: Yes Necrotic Amount: Medium (34-66%) Necrotic Quality: Adherent Slough Treatment Notes Wound #43 (Calcaneus) Wound Laterality: Right Cleanser Soap and Water Discharge Instruction: Gently cleanse wound with antibacterial soap, rinse and pat dry prior to dressing wounds Peri-Wound Care Topical Triamcinolone Acetonide Cream, 0.1%, 15 (g) tube Discharge Instruction:  Apply as directed by Taiten Brawn. Primary Dressing Silvercel Small 2x2 (in/in) Discharge Instruction: Apply Silvercel Small 2x2 (in/in) as instructed Secondary Dressing ABD Pad 5x9 (in/in) Discharge Instruction: Cover with ABD pad Secured With Compression Wrap Medichoice 4 layer Compression System, 35-40 mmHG Adam Merritt, JUE. ([id number]) Discharge Instruction: Apply multi-layer wrap as directed. Compression Stockings Environmental education officer) Signed: 06/10/2021 5:16:08 PM By: Gretta Cool, BSN, RN, CWS, Kim RN, BSN Entered By: Gretta Cool, BSN, RN, CWS, Kim on 06/09/2021 16:11:56

## 2021-06-13 ENCOUNTER — Telehealth: Payer: Self-pay

## 2021-06-13 NOTE — Telephone Encounter (Signed)
Compleated Medical Records for [facility] Mailed to [place] [address]

## 2021-06-15 ENCOUNTER — Emergency Department: Payer: Medicare PPO

## 2021-06-15 ENCOUNTER — Encounter: Payer: Self-pay | Admitting: Medical Oncology

## 2021-06-15 ENCOUNTER — Ambulatory Visit: Payer: Medicare PPO | Admitting: Internal Medicine

## 2021-06-15 ENCOUNTER — Emergency Department
Admission: EM | Admit: 2021-06-15 | Discharge: 2021-06-15 | Disposition: A | Discharge status: Home / Self Care | Payer: Medicare PPO | Attending: Student in an Organized Health Care Education/Training Program | Admitting: Student in an Organized Health Care Education/Training Program

## 2021-06-15 DIAGNOSIS — J40 Bronchitis, not specified as acute or chronic: Secondary | ICD-10-CM | POA: Diagnosis not present

## 2021-06-15 DIAGNOSIS — M542 Cervicalgia: Secondary | ICD-10-CM | POA: Diagnosis not present

## 2021-06-15 DIAGNOSIS — R059 Cough, unspecified: Secondary | ICD-10-CM | POA: Diagnosis not present

## 2021-06-15 DIAGNOSIS — I2510 Atherosclerotic heart disease of native coronary artery without angina pectoris: Secondary | ICD-10-CM | POA: Diagnosis not present

## 2021-06-15 DIAGNOSIS — M47812 Spondylosis without myelopathy or radiculopathy, cervical region: Secondary | ICD-10-CM | POA: Diagnosis not present

## 2021-06-15 DIAGNOSIS — I509 Heart failure, unspecified: Secondary | ICD-10-CM | POA: Insufficient documentation

## 2021-06-15 DIAGNOSIS — I517 Cardiomegaly: Secondary | ICD-10-CM | POA: Diagnosis not present

## 2021-06-15 DIAGNOSIS — M545 Low back pain, unspecified: Secondary | ICD-10-CM | POA: Diagnosis not present

## 2021-06-15 DIAGNOSIS — N189 Chronic kidney disease, unspecified: Secondary | ICD-10-CM | POA: Insufficient documentation

## 2021-06-15 DIAGNOSIS — R531 Weakness: Secondary | ICD-10-CM | POA: Diagnosis not present

## 2021-06-15 DIAGNOSIS — R0602 Shortness of breath: Secondary | ICD-10-CM | POA: Diagnosis not present

## 2021-06-15 DIAGNOSIS — R5381 Other malaise: Secondary | ICD-10-CM | POA: Diagnosis not present

## 2021-06-15 DIAGNOSIS — M2578 Osteophyte, vertebrae: Secondary | ICD-10-CM | POA: Diagnosis not present

## 2021-06-15 LAB — CBC
HCT: 33.7 % — ABNORMAL LOW (ref 39.0–52.0)
Hemoglobin: 11.1 g/dL — ABNORMAL LOW (ref 13.0–17.0)
MCH: 30.5 pg (ref 26.0–34.0)
MCHC: 32.9 g/dL (ref 30.0–36.0)
MCV: 92.6 fL (ref 80.0–100.0)
Platelets: 167 10*3/uL (ref 150–400)
RBC: 3.64 MIL/uL — ABNORMAL LOW (ref 4.22–5.81)
RDW: 13.8 % (ref 11.5–15.5)
WBC: 9.3 10*3/uL (ref 4.0–10.5)
nRBC: 0 % (ref 0.0–0.2)

## 2021-06-15 LAB — BASIC METABOLIC PANEL
Anion gap: 6 (ref 5–15)
BUN: 18 mg/dL (ref 8–23)
CO2: 28 mmol/L (ref 22–32)
Calcium: 8.5 mg/dL — ABNORMAL LOW (ref 8.9–10.3)
Chloride: 101 mmol/L (ref 98–111)
Creatinine, Ser: 1.94 mg/dL — ABNORMAL HIGH (ref 0.61–1.24)
GFR, Estimated: 35 mL/min — ABNORMAL LOW (ref >60)
Glucose, Bld: 139 mg/dL — ABNORMAL HIGH (ref 70–99)
Potassium: 4.5 mmol/L (ref 3.5–5.1)
Sodium: 135 mmol/L (ref 135–145)

## 2021-06-15 LAB — TROPONIN I (HIGH SENSITIVITY)
Troponin I (High Sensitivity): 3 ng/L (ref <18)
Troponin I (High Sensitivity): <2 ng/L (ref <18)

## 2021-06-15 MED ORDER — ALBUTEROL SULFATE HFA 108 (90 BASE) MCG/ACT IN AERS
2.0000 | INHALATION_SPRAY | RESPIRATORY_TRACT | Status: DC | PRN
  Filled 2021-06-15: qty 6.7

## 2021-06-15 MED ORDER — PREDNISONE 20 MG PO TABS: 20.0000 mg | ORAL_TABLET | Freq: Every day | ORAL | 0 refills | Status: AC

## 2021-06-15 MED ORDER — IPRATROPIUM-ALBUTEROL 0.5-2.5 (3) MG/3ML IN SOLN: 3.0000 mL | RESPIRATORY_TRACT | 3 refills | Status: DC | PRN

## 2021-06-15 MED ORDER — IPRATROPIUM-ALBUTEROL 0.5-2.5 (3) MG/3ML IN SOLN
3.0000 mL | Freq: Once | RESPIRATORY_TRACT | Status: AC
Start: 2021-06-15 — End: 2021-06-15
  Administered 2021-06-15: 3 mL via RESPIRATORY_TRACT
  Filled 2021-06-15: qty 3

## 2021-06-15 MED ORDER — PREDNISONE 20 MG PO TABS
20.0000 mg | ORAL_TABLET | Freq: Once | ORAL | Status: AC
  Administered 2021-06-15: 20 mg via ORAL
  Filled 2021-06-15: qty 1

## 2021-06-15 NOTE — ED Provider Notes (Signed)
[facility] Provider Note    Event Date/Time   First MD Initiated Contact with Patient 06/15/21 1242     (approximate)   History   Fall and Shortness of Breath   HPI  Adam Merritt is a 76 y.o. male with a history of chronic bronchitis CKD, CAD, CHF presents to the ER for evaluation of neck pain and low back pain after he fell getting out of bed this morning.  States his breathing does feel little bit worse than normal but he is not hypoxic he denies any chest pain or pressure no pain with deep inspiration.  Not been on any recent steroids or antibiotics.  He is up-to-date on his vaccinations and got his flu shot.  States that the only reason he came to the ER was because when EMS was called to help him get back into the bed they told him that he had "bronchitis and that that could turn into pneumonia" and that he should come to the ER     Physical Exam   Triage Vital Signs: ED Triage Vitals  Enc Vitals Group     BP 06/15/21 1112 (!) 119/49     Pulse Rate 06/15/21 1112 67     Resp 06/15/21 1112 20     Temp 06/15/21 1112 98.3 F (36.8 C)     Temp Source 06/15/21 1112 Oral     SpO2 06/15/21 1112 94 %     Weight 06/15/21 1113 300 lb (136.1 kg)     Height 06/15/21 1113 5\' 6"  (1.676 m)     Head Circumference --      Peak Flow --      Pain Score 06/15/21 1113 4     Pain Loc --      Pain Edu? --      Excl. in [place]? --     Most recent vital signs: Vitals:   06/15/21 1430 06/15/21 1600  BP: (!) 110/52 133/62  Pulse: 62 72  Resp: 19 19  Temp:    SpO2: 96% 97%     Constitutional: Alert  Eyes: Conjunctivae are normal.  Head: Atraumatic. Nose: No congestion/rhinnorhea. Mouth/Throat: Mucous membranes are moist.   Neck: Painless ROM.  Cardiovascular:   Good peripheral circulation. Respiratory: no tachypnea, scattered wheeze throughout Gastrointestinal: Soft and nontender.  Musculoskeletal:  no deformity, chronic lymphedenma to ble Neurologic:   MAE spontaneously. No gross focal neurologic deficits are appreciated.  Skin:  Skin is warm, dry and intact. No rash noted. Psychiatric: Mood and affect are normal. Speech and behavior are normal.    ED Results / Procedures / Treatments   Labs (all labs ordered are listed, but only abnormal results are displayed) Labs Reviewed  CBC - Abnormal; Notable for the following components:      Result Value   RBC 3.64 (*)    Hemoglobin 11.1 (*)    HCT 33.7 (*)    All other components within normal limits  BASIC METABOLIC PANEL - Abnormal; Notable for the following components:   Glucose, Bld 139 (*)    Creatinine, Ser 1.94 (*)    Calcium 8.5 (*)    GFR, Estimated 35 (*)    All other components within normal limits  TROPONIN I (HIGH SENSITIVITY)  TROPONIN I (HIGH SENSITIVITY)     EKG  ED ECG REPORT I, Merlyn Lot, the attending physician, personally viewed and interpreted this ECG.   Date: 06/15/2021  EKG Time: 11:17  Rate: 70  Rhythm: sinus  Axis: normal  Intervals: normal qt  ST&T Change: no stemi, no depression    RADIOLOGY Please see ED Course for my review and interpretation.  I personally reviewed all radiographic images ordered to evaluate for the above acute complaints and reviewed radiology reports and findings.  These findings were personally discussed with the patient.  Please see medical record for radiology report.    PROCEDURES:  Critical Care performed: No  Procedures   MEDICATIONS ORDERED IN ED: Medications  albuterol (VENTOLIN HFA) 108 (90 Base) MCG/ACT inhaler 2 puff (has no administration in time range)  predniSONE (DELTASONE) tablet 20 mg (has no administration in time range)  ipratropium-albuterol (DUONEB) 0.5-2.5 (3) MG/3ML nebulizer solution 3 mL (3 mLs Nebulization Given 06/15/21 1350)     IMPRESSION / MDM / ASSESSMENT AND PLAN / ED COURSE  I reviewed the triage vital signs and the nursing notes.                               Differential diagnosis includes, but is not limited to, bronchitis, chf, copd, pna, ptx, fracture, contusion  Presenting with shortness of breath and fall.  Does have a history of bronchitis does not feel like it significantly worse than previous.  Complaining of some mild neck pain as well as low back pain.  No numbness or tingling.  Did not hit his head.  Do not feel CT head clinically indicated.  Chest x-ray by my review does not show any evidence of pneumothorax.  Exam is consistent with bronchitis.  Low suspicion for ACS or PE.  No findings to suggest pneumonia.  Clinical Course as of 06/15/21 1638  Wed Jun 15, 2021  1635 CT cervical spine without evidence of fracture.  Patient signed out to oncoming physician pending repeat troponin.  If negative does appear stable and appropriate for outpatient follow-up. [PR]  1637 Patient reassessed.  Feels significantly improved.  Will give low-dose of prednisone as he states he is not been able to tolerate higher doses but has been able to tolerate low-dose in the past for bronchitis as well as refill for his nebulizers.  Negative troponin to believe he is appropriate for outpatient follow-up.  Patient agreeable to plan. [PR]    Clinical Course User Index [PR] Merlyn Lot, MD     FINAL CLINICAL IMPRESSION(S) / ED DIAGNOSES   Final diagnoses:  Bronchitis     Rx / DC Orders   ED Discharge Orders          Ordered    ipratropium-albuterol (DUONEB) 0.5-2.5 (3) MG/3ML SOLN  Every 4 hours PRN        06/15/21 1640    predniSONE (DELTASONE) 20 MG tablet  Daily        06/15/21 1640             Note:  This document was prepared using Dragon voice recognition software and may include unintentional dictation errors.    Merlyn Lot, MD 06/15/21 [phone]

## 2021-06-15 NOTE — ED Triage Notes (Signed)
Pt to ED via EMS with reports that he was trying to get out the bed this am into his chair when he fell. Denies LOC. Pt reports gen pain. Pt also reports that he has been feeling sob for the past week. Hx of chronic bronchitis.

## 2021-06-15 NOTE — ED Notes (Signed)
Pt transported to Xray. 

## 2021-06-16 ENCOUNTER — Other Ambulatory Visit: Payer: Self-pay

## 2021-06-16 ENCOUNTER — Encounter: Payer: Medicare PPO | Admitting: Physician Assistant

## 2021-06-16 DIAGNOSIS — L89612 Pressure ulcer of right heel, stage 2: Secondary | ICD-10-CM | POA: Diagnosis not present

## 2021-06-16 DIAGNOSIS — E1140 Type 2 diabetes mellitus with diabetic neuropathy, unspecified: Secondary | ICD-10-CM | POA: Diagnosis not present

## 2021-06-16 DIAGNOSIS — I872 Venous insufficiency (chronic) (peripheral): Secondary | ICD-10-CM | POA: Diagnosis not present

## 2021-06-16 DIAGNOSIS — L97812 Non-pressure chronic ulcer of other part of right lower leg with fat layer exposed: Secondary | ICD-10-CM | POA: Diagnosis not present

## 2021-06-16 DIAGNOSIS — M1990 Unspecified osteoarthritis, unspecified site: Secondary | ICD-10-CM | POA: Diagnosis not present

## 2021-06-16 DIAGNOSIS — L97822 Non-pressure chronic ulcer of other part of left lower leg with fat layer exposed: Secondary | ICD-10-CM | POA: Diagnosis not present

## 2021-06-16 DIAGNOSIS — L97412 Non-pressure chronic ulcer of right heel and midfoot with fat layer exposed: Secondary | ICD-10-CM | POA: Diagnosis not present

## 2021-06-16 DIAGNOSIS — E1151 Type 2 diabetes mellitus with diabetic peripheral angiopathy without gangrene: Secondary | ICD-10-CM | POA: Diagnosis not present

## 2021-06-16 DIAGNOSIS — I890 Lymphedema, not elsewhere classified: Secondary | ICD-10-CM | POA: Diagnosis not present

## 2021-06-16 DIAGNOSIS — E11621 Type 2 diabetes mellitus with foot ulcer: Secondary | ICD-10-CM | POA: Diagnosis not present

## 2021-06-16 NOTE — Progress Notes (Addendum)
MICHAELANGELO, MITTELMAN ([id number]) Visit Report for 06/16/2021 Chief Complaint Document Details Patient Name: Adam Merritt, Adam Merritt Date of Service: 06/16/2021 10:15 AM Medical Record Number: [id number] Patient Account Number: [id number] Date of Birth/Sex: December 23, 1945 (75 y.o. M) Treating RN: Carlene Coria Primary Care Provider: [name] Other Clinician: Referring Provider: [name] Treating Provider/Extender: Skipper Cliche in Treatment: 11 Information Obtained from: Patient Chief Complaint Bilateral LE edema and weeping ulcers Electronic Signature(s) Signed: 06/16/2021 10:40:27 AM By: Worthy Keeler PA-C Entered By: Worthy Keeler on 06/16/2021 10:40:26 Adam Merritt ([id number]) -------------------------------------------------------------------------------- HPI Details Patient Name: Adam Merritt Date of Service: 06/16/2021 10:15 AM Medical Record Number: [id number] Patient Account Number: [id number] Date of Birth/Sex: June 17, 1945 (75 y.o. M) Treating RN: Carlene Coria Primary Care Provider: [name] Other Clinician: Referring Provider: [name] Treating Provider/Extender: Skipper Cliche in Treatment: 11 History of Present Illness HPI Description: 10/18/17-He is here for initial evaluation of bilateral lower extremity ulcerations in the presence of venous insufficiency and lymphedema. He has been seen by vascular medicine in the past, Dr. Lucky Cowboy, last seen in 2016. He does have a history of abnormal ABIs, which is to be expected given his lymphedema and venous insufficiency. According to Epic, it appears that all attempts for arterial evaluation and/or angiography were not follow through with by patient. He does have a history of being seen in lymphedema clinic in 2018, stopped going approximately 6 months ago stating "it didn't do any good". He does not have lymphedema pumps, he does not have custom fit compression wrap/stockings. He is diabetic and his recent A1c last month was  7.6. He admits to chronic bilateral lower extremity pain, no change in pain since blister and ulceration development. He is currently being treated with Levaquin for bronchitis. He has home health and we will continue. 10/25/17-He is here in follow-up evaluation for bilateral lower extremity ulcerationssubtle he remains on Levaquin for bronchitis. Right lower extremity with no evidence of drainage or ulceration, persistent left lower extremity ulceration. He states that home health has not been out since his appointment. He went to [facility] on Tuesday, studies revealed: RIGHT ABI 0.9, TBI 0.6 LEFT ABI 1.1, TBI 0.6 with triphasic flow bilaterally. We will continue his same treatment plan. He has been educated on compression therapy and need for elevation. He will benefit from lymphedema pumps 11/01/17-He is here in follow-up evaluation for left lower extremity ulcer. The right lower extremity remains healed. He has home health services, but they have not been out to see the patient for 2-3 weeks. He states it home health physical therapy changed his dressing yesterday after therapy; he placed Ace wrap compression. We are still waiting for lymphedema pumps, reordered d/t need for company change. 11/08/17-He is here in follow-up evaluation for left lower extremity ulcer. It is improved. Edema is significantly improved with compression therapy. We will continue with same treatment plan and he will follow-up next week. No word regarding lymphedema pumps 11/15/17-He is here in follow-up evaluation for left lower extremity ulcer. He is healed and will be discharged from wound care services. I have reached out to [facility] regarding his lymphedema pumps. They have been unable to reach the patient; the contact number they had with the patient's wife's cell phone and she has not answered any unrecognized calls. Contact should be made today, trial planned for next week; [facility]  will continue to follow 11/27/17 on evaluation today patient has multiple blistered areas over the right  lower extremity his left lower extremity appears to be doing okay. These blistered areas show signs of no infection which is great news. With that being said he did have some necrotic skin overlying which was mechanically debrided away with saline and gauze today without complication. Overall post debridement the wounds appear to be doing better but in general his swelling seems to be increased. This is obviously not good news. I think this is what has given rise to the blisters. 12/04/17 on evaluation today patient presents for follow-up concerning his bilateral lower extremity edema in the right lower extremity ulcers. He has been tolerating the dressing changes without complication. With that being said he has had no real issues with the wraps which is also good news. Overall I'm pleased with the progress he's been making. 12/11/17 on evaluation today patient appears to be doing rather well in regard to his right lateral lower extremity ulcer. He's been tolerating the dressing changes without complication. Fortunately there does not appear to be any evidence of infection at this time. Overall I'm pleased with the progress that is being made. Unfortunately he has been in the hospital due to having what sounds to be a stomach virus/flu fortunately that is starting to get better. 12/18/17 on evaluation today patient actually appears to be doing very well in regard to his bilateral lower extremities the swelling is under fairly good control his lymphedema pumps are still not up and running quite yet. With that being said he does have several areas of opening noted as far as wounds are concerned mainly over the left lower extremity. With that being said I do believe once he gets lymphedema pumps this would least help you mention some the fluid and preventing this from occurring. Hopefully that will be set  up soon sleeves are Artie in place at his home he just waiting for the machine. 12/25/17 on evaluation today patient actually appears to be doing excellent in fact all of his ulcers appear to have resolved his legs appear very well. I do think he needs compression stockings we have discussed this and they are actually going to go to [place] today to elastic therapy to get this fitted for him. I think that is definitely a good thing to do. Readmission: 04/09/18 upon evaluation today this patient is seen for readmission due to bilateral lower extremity lymphedema. He has significant swelling of his extremities especially on the left although the right is also swollen he has weeping from both sides. There are no obvious open wounds at this point. Fortunately he has been doing fairly well for quite a bit of time since I last saw him. Nonetheless unfortunately this seems to have reopened and is giving quite a bit of trouble. He states this began about a week ago when he first called [place] to get in to be seen. No fevers, chills, nausea, or vomiting noted at this time. He has not been using his lymphedema pumps due to the fact that they won't fit on his leg at this point likewise is also not been using his compression for essentially the same reason. 04/16/18 upon evaluation today patient actually appears to be doing a little better in regard to the fluid in his bilateral lower extremities. With that being said he's had three falls since I saw him last week. He also states that he's been feeling very poorly. I was concerned last week and feel like that the concern is still there as far as the congestion  in his chest is concerned he seems to be breathing about the same as last week but again he states he's very weak he's not even able to walk further than from the chair to the door. His wife had to buy a wheelchair just to be able to get them out of the house to get to the appointment today. This has me very  concerned. 04/23/18 on evaluation today patient actually appears to be doing much better than last week's evaluation. At that time actually had to transport him to the ER via EMS and he subsequently was admitted for acute pulmonary edema, acute renal failure, and acute congestive heart failure. Fortunately he is doing much better. Apparently they did dialyze him and were able to take off roughly 35 pounds of fluid. Nonetheless he is feeling much better both in regard to his breathing and he's able to get around much better at this time compared to previous. Overall I'm very Adam Merritt, Adam Merritt ([id number]) happy with how things are at this time. There does not appear to be any evidence of infection currently. No fevers, chills, nausea, or vomiting noted at this time. 04/30/2018 patient seen today for follow-up and management of bilateral lower extremity lymphedema. He did express being more sad today than usual due to the recent loss of his dog. He states that he has been compliant with using the lymphedema pumps. However he does admit a minute over the last 2-3 days he has not been using the pumps due to the recent loss of his dog. At this time there is no drainage or open wounds to his lower extremities. The left leg edema is measuring smaller today. Still has a significant amount of edema on bilateral lower extremities With dry flaky skin. He will be referred to the lymphedema clinic for further management. Will continue 3 layer compression wraps and follow-up in 1-2 weeks.Denies any pain, fever, chairs recently. No recent falls or injuries reported during this visit. 05/07/18 on evaluation today patient actually appears to be doing very well in regard to his lower extremities in general all things considered. With that being said he is having some pain in the legs just due to the amount of swelling. He does have an area where he had a blister on the left lateral lower extremity this is open at this  point other than that there's nothing else weeping at this time. 05/14/18 on evaluation today patient actually appears to be doing excellent all things considered in regard to his lower extremities. He still has a couple areas of weeping on each leg which has continued to be the issue for him. He does have an appointment with the lymphedema clinic although this isn't until February 2020. That was the earliest they had. In the meantime he has continued to tolerate the compression wraps without complication. 05/28/18 on evaluation today patient actually appears to be doing more poorly in regard to his left lower extremity where he has a wound open at this point. He also had a fall where he subsequently injured his right great toe which has led to an open wound at the site unfortunately. He has been tolerating the dressing changes without complication in general as far as the wraps are concerned that he has not been putting any dressing on the left 1st toe ulcer site. 06/11/18 on evaluation today patient appears to be doing much worse in regard to his bilateral lower extremity ulcers. He has been tolerating the dressing changes without complication  although his legs have not been wrapped more recently. Overall I am not very pleased with the way his legs appear. I do believe he needs to be back in compression wraps he still has not received his compression wraps from the [facility] hospital as of yet. 06/18/18 on evaluation today patient actually appears to be doing significantly better than last time I saw him. He has been tolerating the compression wraps without complication in the circumferential ulcers especially appear to be doing much better. His toe ulcer on the right in regard to the great toe is better although not as good as the legs in my pinion. No fevers chills noted 07/02/18 on evaluation today patient appears to be doing much better in regard to his lower extremity ulcers. Unfortunately since I last  saw him he's had the distal portion of his right great toe if he dated it sounds as if this actually went downhill very quickly. I had only seen him a few days prior and the toe did not appear to be infected at that point subsequently became infected very rapidly and it was decided by the surgeon that the distal portion of the toe needed to be removed. The patient seems to be doing well in this regard he tells me. With that being said his lower extremities are doing better from the standpoint of the wounds although he is significantly swollen at this point. 07/09/18 on evaluation today patient appears to be doing better in regard to the wounds on his lower extremities. In fact everything is almost completely healed he is just a small area on the left posterior lower extremity that is open at this point. 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 With that being said he needs some kind of compression it does sound like he may not be wearing his compression, that is the wraps, during the entire time between when he's here visit to visit. Apparently his wife took the current one off because it began to "fall apart". 07/16/18 on evaluation today patient appears to be extremely swollen especially in regard to his left lower extremity unfortunately. He also has a new skin tear over the left lower extremity and there's a smaller area on the right lower extremity as well. Unfortunately this seems to be due in part to blistering and fluid buildup in his leg. He did get the reduction wraps that were ordered by the [facility] hospital for him to go to lymphedema clinic. With that being said his wounds on the legs have not healed to the point to where they would likely accept them as a patient lymphedema clinic currently. We need to try to  get this to heal. With that being said he's been taking his wraps off which is not doing him any favors at this point. In fact this is probably quite counterproductive compared to what needs to occur. We will likely need to increase to a four layer compression wrap and continue to also utilize elevation and he has to keep the wraps on not take them off as he's been doing currently hasn't had a wrap on since Saturday. 07/23/18 on evaluation today patient appears to be doing much better in regard to his bilateral lower extremities. In fact his left lower extremity which was the largest is actually 15 cm smaller today compared  to what it was last time he was here in our clinic. This is obviously good news after just one week. Nonetheless the differences he actually kept the wraps on during the entire week this time. That's not typical for him. I do believe he understands a little bit better now the severity of the situation and why it's important for him to keep these wraps on. 07/30/18 on evaluation today patient actually appears to be doing rather well in regard to his lower extremities. His legs are much smaller than they have been in the past and he actually has only one very small rudder superficial region remaining that is not closed on the left lateral/posterior lower extremity even this is almost completely close. I do believe likely next week he will be healed without any complications. I do think we need to continue the wraps however this seems to be beneficial for him. I also think it may be a good time for [place] to go ahead and see about getting the appointment with the lymphedema clinic which is supposed to be made for him in order to keep this moving along and hopefully get them into compression wraps that will in the end help him to remain healed. 08/06/18 on evaluation today patient actually appears to be doing very well in regard as bilateral lower extremities. In fact his wounds appear to  be completely healed at this time. 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 He is in the process of getting appointment with the lymphedema clinic we have made this referral were just waiting to hear back on the schedule time. We need to follow up on that today as well. 08/13/18 on evaluation today patient actually appears to be doing very well in regard to his bilateral lower extremities there are no open wounds at this point. We are gonna go ahead and see about ordering the Velcro compression wraps for him had a discussion with them about [place] doing it versus the [place] they feel like they can definitely afford going ahead and get the wraps themselves and they would prefer to try to avoid having to go to the lymphedema clinic if it all possible which I completely understand. As long as he has good compression I'm okay either way. 3/17//20 on evaluation today patient actually appears to be doing well in regard to his bilateral lower extremity ulcers. He has been tolerating the dressing changes without complication specifically the compression wraps. Overall is had no issues my fingers finding that I see at this point is that he is having trouble with constipation. He tells me he has not been able to go to the bathroom for about six days. He's taken to over-the- counter oral laxatives unfortunately this is not helping. He has not contacted his doctor. Adam Merritt, Adam Merritt ([id number]) 08/27/18 on evaluation today patient appears to be doing fairly well in regard to his lower extremities at this point. There does not appear to be any new altars is swelling is very well controlled. We are still waiting for his Velcro compression wraps to arrive that should be sometime in the next week is Artie sent the check for these. 09/03/18 on evaluation today patient appears to be doing excellent in regard to his bilateral lower extremities which he shows no signs of  wound openings in regard to this point. He does have his Velcro compression wraps which did arrive in the mail since I saw him last week.  Overall he is doing excellent in my pinion. 09/13/18 on evaluation today patient appears to be doing very well currently in regard to the overall appearance of his bilateral lower extremities although he's a little bit more swollen than last time we saw him. At that point he been discharged without any open wounds. Nonetheless he has a small open wound on the posterior left lower extremity with some evidence of cellulitis noted as well. Fortunately I feel like he has made good progress overall with regard to his lower extremities from were things used to be. 09/20/18 on evaluation today patient actually appears to be doing much better. The erythematous lower extremity is improving wound itself which is still open appears to be doing much better as far as but appearance as well as pain is concerned overall very pleased in this regard. There's no signs of active infection at this time. 09/27/18 on evaluation today patient's wounds on the lower extremity actually appear to be doing fairly well at this time which is good news. There is no evidence of active infection currently and again is just as left lower extremity were there any wounds at this point anyway. I believe they may be completely healed but again I'm not 100% sure based on evaluation today. I think one more week of observation would likely be a good idea. 10/04/18 on evaluation today patient actually appears to be doing excellent in regard to the left lower extremity which actually appears to be completely healed as of today. Unfortunately he's been having issues with his right lower extremity have a new wound that has opened. Fortunately there's no evidence of active infection at this time which is good news. 10/10/18 upon evaluation today patient actually appears to be doing a little bit worse with the new  open area on his right posterior lower extremity. He's been tolerating the dressing changes without complication. Right now we been using his compression wraps although I think we may need to switch back to actually performing bilateral compression wraps are in the clinic. No fevers, chills, nausea, or vomiting noted at this time. 10/17/18 on evaluation today patient actually appears to be doing quite well in regard to his bilateral lower extremity ulcers. He has been tolerating the reps without complication although he would prefer not to rewrap his legs as of today. Fortunately there's no signs of active infection which is good news. No fevers, chills, nausea, or vomiting noted at this time. 10/24/18 on evaluation today patient appears to be doing very well in regard to his lower extremities. His right lower extremity is shown signs of healing and his left lower Trinity though not healed appears to be improving which is excellent news. Overall very pleased with how things seem to be progressing at this time. The patient is likewise happy to hear this. His Velcro compression wraps however have not been put on properly will gonna show his wife how to do that properly today. 10/31/18 on evaluation today patient appears to be doing more poorly in regard to his left lower extremity in particular although both lower extremities actually are showing some signs of being worse than my previous evaluation. Unfortunately I'm just not sure that his compression stockings even with the use of the compression/lymphedema pumps seem to be controlling this well. Upon further questioning he tells me that he also is not able to lie flat in the bed due to his congestive heart failure and difficulty breathing. For that reason he sleeps in his  recliner. To make matters worse his recliner also cannot even hold his legs up so instead of even being somewhat elevated they pretty much hang down to the floor. This is the way he  sleeps each night which is definitely counterproductive to everything else that were attempting to do from the standpoint of controlling his fluid. Nonetheless I think that he potentially could benefit from a hospital bed although this would be something that his primary care provider would likely have to order since anything that is order on our side has to be directly related to wound care and again the hospital bed is not necessarily a direct relation to although I think it does contribute to his overall wound status on his lower extremities. 11/07/18 on evaluation today patient appears to be doing better in regard to his bilateral lower extremity. He's been tolerating the dressing changes without complication. We did in the interim since I last saw him switch to just using extras orbiting new alginate and that seems to have done much better for him. I'm very pleased with the overall progress that is made. 11/14/18 on evaluation today patient appears to be redoing rather well in regard to his left lower extremity ulcers which are the only ones remaining at this point. Fortunately there's no signs of active infection at this time which is good news. Overall been very pleased with how things seem to be progressing currently. No fevers, chills, nausea, or vomiting noted at this time. 11/20/17 on evaluation today patient actually appears to be doing quite well in regard to his lower extremities on the left on the right he has several blisters that showed up although there's some question about whether or not he has had his broker compression wraps on like he was supposed to or not. Fortunately there's no signs of active infection at this time which is good news. Unfortunately though he is doing well from the standpoint of the left leg the right leg is not doing as well again this is when we did not wrap last week. 11/28/18 on evaluation today patient appears to be doing well in regard to his bilateral lower  extremities is left appears to be healed is right is not healed but is very close to being so. Overall very pleased with how things seem to be progressing. Patient is likewise happy that things are doing well. 12/09/18 on evaluation today patient actually appears to be completely healed which is excellent news. He actually seems to be doing well in regard to the swelling of the bilateral lower extremities which is also great news. Overall very pleased with how things seem to be progressing. Readmission: 03/18/2019 upon evaluation today patient presents for reevaluation concerning issues that he is having with his left lower extremity where he does have a wound noted upon inspection today. He also has a wound on the right second toe on the tip where it is apparently been rubbing in his shoe I did look at issues and you can actually see where this has been occurring as well. Fortunately there is no signs of infection with regard to the toe necessarily although it is very swollen compared to normal that does have me somewhat concerned about the possibility of further evaluation for infection/osteomyelitis. I would recommend an x-ray to start with. Otherwise he states that it is been 1-2 weeks that he has had the draining in regard to left lower extremity he does not know how this happened he has been wearing his  compression appropriately and I do see that as well today but nonetheless I do think that he needs to continue to be very cautious with regard to elevation as well. Adam Merritt, Adam Merritt ([id number]) 03/25/2019 on evaluation today patient appears to be doing much better in regard to his lower extremity at this time. That is on the left. He did culture positive for Pseudomonas but the good news is he seems to be doing much better the gentamicin we applied topically seems to have done a great job for him. There is no signs of active infection at this time systemically and locally he is doing much better.  No fevers, chills, nausea, vomiting, or diarrhea. In regard to his right toe this seems to be doing much better there is very little pressure noted at this point I did clean up some of the callus today around the edges of the wound as well as the surface of the wound but to be honest he is progressing quite nicely. 10/30; the area on the left anterior lower tibia area is healed over. His edema control is adequate even though his use of the compression pumps seems very intermittent. He has a juxta lite stocking on the right leg and a think there is 1 for the left leg at home. His wife is putting these on. He has an area on the plantar right second toe which is a hammertoe they are trying to offload this 04/08/2019 on evaluation today patient appears to be doing well in regard to his lower extremities which is good news. We are seeing him today for his toe ulcer which is giving him a little bit more trouble but again still seems to be doing better at this point which is good news. He is going require some sharp debridement in regard to the toe today however. 04/15/2019 on evaluation today patient actually appears to be doing quite well with regard to his toe ulcer. I am very pleased with how things are doing in that regard. With regard to his lower extremity edema in general he tells me he is not been using the lymphedema pumps regularly although he does not use them. I think that he needs to use them more regularly he does have a small blister on the left lower extremity this is not open at this point but obviously it something that can get worse if he does not keep the compression going and the pumps going as well. 04/22/2019 on evaluation today patient appears to be doing well with regard to his toe ulcer. He has been tolerating the dressing changes without complication. This is measuring slightly smaller this week as compared to last week. Fortunately there is no signs of active infection at this  time. 05/06/2019 on evaluation today patient actually appears to be doing excellent. In fact his toe appears to be completely healed which is great news. There is no signs of active infection at this time. Readmission: Patient presents today for follow-up after having been discharged at the beginning of December 2020. He states that in recent weeks things have reopened and has been having more trouble at this point. With that being said fortunately there is no signs of active infection at this time. No fever chills noted. Nonetheless he also does have an issue with one of his toes as well that seems to be somewhat this is on the right foot second toe. 07/25/2019 upon evaluation today patient's lower extremities appear to be doing excellent bilaterally. I really  feel like he is showing signs of great improvement and in fact I think he is close to healing which is great news. There is no signs of active infection at this time. No fevers, chills, nausea, vomiting, or diarrhea. 07/31/2019 upon evaluation today patient appears to be doing excellent and in fact I think may be completely healed in regard to his right lower extremity that were still to monitor this 1 more week before closing it out. The left lower extremity is measuring smaller although not completely closed seems to be doing excellent. 08/07/2019 upon evaluation today patient appears to be doing excellent in regard to his wounds. In fact the right lower extremity is completely healed there is no issues here. The left lower extremity has 1 very small area still remaining fortunately there is no signs of infection and overall I think he is very close to closure of the here as well. 08/14/19 upon evaluation today patient appears to be doing excellent in regard to his lower extremities. In fact he appears to be completely healed as of today. Fortunately there's no signs of active infection at this time. No fevers, chills, nausea, or vomiting noted  at this time. READMISSION 09/26/2019 This is a 76 year old man who is well-known to this clinic having just been discharged on 08/14/2019. He has known lymphedema and chronic venous insufficiency. He has Farrow wrap stockings and also external compression pumps. He does not use the external compression pumps however. He does however apparently fairly faithfully use the stockings. They have not been lotion in his legs. He has developed new wounds on the right anterior as well as the left medial left lateral and left posterior calf. He returns for our review of this Past medical history includes coronary artery disease, hypertension, congestive heart failure, COPD, venous insufficiency with lymphedema, type 2 diabetes. He apparently has compression pumps, Farrow wraps and at 1 point a hospital bed although I believe he sleeps in a recliner 10/03/2019 upon evaluation today patient appears to be doing better with regard to his wounds in general. He has been tolerating the dressing changes without complication. Fortunately there is no signs of active infection. No fevers, chills, nausea, vomiting, or diarrhea. I do believe the compression wraps are helping as they always have in the past. 10/10/2019 upon evaluation today patient appears to be doing excellent at this point. His right lower extremity is measuring much better and in fact is completely healed. His left lower extremity is also measuring better though not healed seems to be doing excellent at this point which is great news. Overall I am very pleased with how things appear currently. 10/27/2019 upon evaluation today patient appears to be doing quite well with regard to his wounds currently. He has been tolerating the dressing changes without complication. Fortunately there is no signs of active infection at this time. In fact he is almost completely healed on the left and has just a very small area open and on the right he is completely  healed. 11/04/2019 upon evaluation today patient appears to be doing quite well with regard to his wounds. In fact he appears to be quite possibly completely healed on the left lower extremity now as well the right lower extremity is still doing well. With that being said unfortunately though this may be healed I think it is a very fragile area and I cannot even confirm there is not a very small opening still remaining. I really think he may benefit from 1 additional  weeks wrapping before discontinuing. 11/10/2019 upon evaluation today patient appears to be doing well in regard to his original wounds in fact everything that we were treating last week is completely healed on the left. Unfortunately he has a new skin tear just above where the wrap slipped down due to a fall he sustained causing this injury. 11/17/2019 on evaluation today patient appears to be doing better with regard to his wound. He has been tolerating the dressing changes without HARLON, KUTNER. ([id number]) complication. Fortunately there is no signs of active infection at this time. No fevers, chills, nausea, vomiting, or diarrhea. 11/24/2019 upon evaluation today patient appears to be doing well with regard to his wound. This is making good progress there does not appear to be signs of active infection but overall I do feel like he is headed in the correct direction. Overall he is tolerating the compression wrap as well. 12/02/2019 upon evaluation today patient appears to be doing well with regard to his leg ulcer. In fact this appears to be completely healed and this is the last of the wounds that he had but still the left anterior lower extremity. Fortunately there is no signs of active infection at this time. No fevers, chills, nausea, vomiting, or diarrhea. Readmission: 02/05/2020 on evaluation today patient appears to be doing well with regard to his wound all things considered. He actually tells me that a couple weeks ago he had  trouble with his wraps and therefore took him off for a couple of days in order to give his legs a break. It was during this time that he actually developed increased swelling and edema of his legs and blisters on both legs. That is when he called to make the appointment. Nonetheless since that time he began wearing his compression wraps which are Velcro in the meantime and has done extremely well with this. In fact his leg appears to be under good control as far as edema is concerned today it is nothing like what I am seeing when he has come in for evaluations in the past. They have been using some of the silver alginate at home which has been beneficial for him. Fortunately there is no signs of active infection at this time. No fevers, chills, nausea, vomiting, or diarrhea. 02/19/2020 on evaluation today patient unfortunately does not appear to be doing quite as well as I would like to see. He has no signs of active infection at this time but unfortunately he is not doing well in regard to the overall appearance of his left leg. He has a couple other areas that are weeping now and the leg is much more swollen. I think that he needs to actually have a compression wrap to try to manage this. 02/26/2020 on evaluation today patient appears to be doing well with regard to his left lower extremity ulcer. Fortunately the swelling is down I do believe the pressure wrap seems to be doing quite well. Fortunately there is no signs of active infection at this time. 03/05/2020 upon evaluation today patient appears to be doing excellent in regard to his legs at this point. Fortunately there is no signs of active infection which is great news. Overall he feels like he is completely healed which is great news. Readmission: 05/20/2020 upon evaluation today patient appears to be doing well with regard to his leg ulcer all things considered. He is actually coming back to see [place] after having had a reopening of the ulcers  on his  left leg in the past several weeks. He is out of dressing supplies and his wife is been try to take care of this as best she can. 06/10/2020 upon evaluation today patient unfortunately appears to be doing significantly worse today compared to when I last saw him. He and his wife both tell me that "there has been a lot going on". I did not actually go into detail about everything that has been happening but nonetheless he has not obviously been wearing his compression. He brings them with him today but they were not on. I am not even certain to be honest that he could get those on at this point. Nonetheless I do believe that he is going to require compression wrapping at some point shortly but right now we need to try to get what appears to be infection under control at this time. 06/28/2020 upon evaluation today patient appears to be doing well with regard to 06/28/2020 upon evaluation today patient appears to be doing well pretty well in regard to his left leg which is much better than previous. With that being said in regard to his right leg he does seem to be having some issues with a new wound after he sustained a fall. This fortunately is not too deep but does appear to be something we need to address. Neither deep which is good. 07/05/2020 on evaluation today patient appears to be doing well with regard to his wounds. In fact everything on the left leg appears to be pretty much healed on the right leg this is very close though not completely closed yet. Fortunately there is no evidence of active infection at this time. No fevers, chills, nausea, vomiting, or diarrhea. 07/13/2019 upon evaluation today patient appears to be making good progress which is great news. Overall I am extremely pleased with where things stand today. There does not appear to be any signs of active infection which is great news and overall his left leg appears healed still the right leg though not healed does seem to be  making good progress which is excellent news. He is having no pain. 07/19/2020 on evaluation today patient appears to be doing well with regard to his wound. He has been tolerating the dressing changes without complication. Fortunately there does not appear to be any signs of active infection at this time. No fevers, chills, nausea, vomiting, or diarrhea. Readmission: 08/20/2020 on evaluation today patient presents for reevaluation here in the clinic concerning issues that he has been having with his bilateral lower extremities. Unfortunately this is an ongoing reoccurring issue. He tells me that he is really not certain exactly what caused the issue although as I discussed this further with him it appears that he has been sitting for the most part and sleeping in his chair with his feet on the ground he is not really elevating at all in fact the chair does not even have a reclining function therefore it is basically just him sitting with his feet on the ground. I think this alone has probably led to his increased swelling he is also having more difficulty walking which means that he is pumping less blood flow through his legs anyway as far as the venous stasis is concerned. All in all I think this is leading to a downward spiral of him not being able to walk very well as his legs are so heavy, they begin to weep, and overall he just does not seem to be doing as well as  it was when I last saw him. 08/27/2020 upon evaluation today patient's legs actually are showing signs of improvement which is great news. There does not appear to be any evidence of infection which is also excellent news. I am extremely pleased with where things stand today. No fevers, chills, nausea, vomiting, or diarrhea. 09/03/20 upon evaluation today patient appears to be doing excellent in regard to his wounds currently. He has been tolerating the dressing changes without complication and overall I am extremely pleased with where  things stand today. No fevers, chills, nausea, vomiting, or diarrhea. Overall he is healed on the right leg although the left leg not being completely healed still is doing significantly better. 09/10/2020/8/22 upon evaluation today patient appears to be doing well with regard to his wounds. He has been tolerating the dressing changes without complication. Fortunately he appears to be completely healed. I am very pleased in that regard. As is the patient his right leg is also doing well he did get a new recliner which is helping him keep his feet off the ground which I think is helped he is down 2 cm on the left compared to last week so I think there is some definite improvement here. Adam Merritt, Adam Merritt ([id number]) Readmission 12/15/20: Mr. Trason Shifflet is a 76 year old male with a past medical history of lymphedema, insulin-dependent type 2 diabetes, COPD and congestive heart failure that presents to the clinic for increased drainage from his legs bilaterally, Left greater than right. Patient has lymphedema. He has been seen in our clinic on multiple occasions for this issue. He currently denies signs of infection. He has not been using his lymphedema pumps. 12/23/2020 upon evaluation today patient appears to be doing well with regard to his wounds. He has been tolerating the dressing changes he really does not have any open wounds just weeping areas of the legs bilaterally and fortunately seems to be doing better at this point. 12/28/2020 upon evaluation today patient's wound is actually showing signs of good improvement. There does not appear to be any evidence of infection which is great news and overall I am extremely pleased at this point. I think is very close to complete resolution. 01/04/2021 upon evaluation today patient actually appears to be completely healed which is great news. I do not see any signs of active infection currently which is also great news. In general I am extremely pleased with  where we stand and I do believe that the patient is tolerating the dressing changes without complication. I believe he is ready to go back into his Velcro compression and I did see that they brought them with him today. Readmission: Patient presents today for follow-up he was last seen August 2. He is having some issues with some mild drainage and leaking from the left leg. This is something that has reopened and again is a recurrent issue for him. Subsequently he does wear his Velcro compression wraps though I think we may want to look into something a little bit better for him I am thinking that we may want to try the juxta fit compression which I think would do much more effective compression treatment overall for him. 04/08/2021 upon evaluation today patient appears to be doing well with regard to his legs. I do feel like he is showing signs of improvement which is great news. No fevers, chills, nausea, vomiting, or diarrhea. 04/15/2021 upon evaluation today patient's wounds actually appear to be very close to complete resolution. He is legs are  actually doing well left leg just has a small area of leaking at this point. Overall I think that we are very close to complete resolution based on what I am seeing. Nonetheless he seems to not be feeling too well today I really do not know what exactly is going on here. 04/22/2021 upon evaluation patient appears to be completely healed based on what I see today. I do not see anything open both legs appear to be doing quite well. 05/20/2021 upon evaluation today patient appears to be doing poorly in regard to his legs. He has not been a full month since we last saw him and he has reopened unfortunately. He does not appear to be any signs of active infection at this time which is great news. No fevers, chills, nausea, vomiting, or diarrhea. 12/27; the patient comes in the clinic today with severe bilateral lymphedema very dry skin but paradoxically his  wounds are actually healed or should I say not open. He has compression pumps that he does not use. [Once per week] he does not have compression stockings. He comes in today with a small presumably pressure ulcer on the lateral plantar heel on the right. The patient states he has been complaining about pain in this area for about 2 months although no wound was recognized until our intake nurse was doing her exam 06/16/2021 upon evaluation today patient appears to be doing better in regard to his wounds. He is not completely healed yet especially in regard to the right heel which is still open. Nonetheless I do think that his left leg is almost completely healed the right leg appears to be doing decently well his right heel is what still mainly open currently. Electronic Signature(s) Signed: 06/16/2021 1:17:19 PM By: Worthy Keeler PA-C Entered By: Worthy Keeler on 06/16/2021 13:17:19 Adam Merritt ([id number]) -------------------------------------------------------------------------------- Physical Exam Details Patient Name: Adam Merritt Date of Service: 06/16/2021 10:15 AM Medical Record Number: [id number] Patient Account Number: [id number] Date of Birth/Sex: 13-Jan-1946 (75 y.o. M) Treating RN: Carlene Coria Primary Care Provider: [name] Other Clinician: Referring Provider: [name] Treating Provider/Extender: Skipper Cliche in Treatment: 11 Constitutional Obese and well-hydrated in no acute distress. Respiratory normal breathing without difficulty. Psychiatric this patient is able to make decisions and demonstrates good insight into disease process. Alert and Oriented x 3. pleasant and cooperative. Notes Patient's wounds are showing signs of significant improvement which is great news and overall I think it would make an excellent progress. I Georgina Peer go ahead and recommend that we continue as such with the silver alginate dressing to any open wound locations. Electronic  Signature(s) Signed: 06/16/2021 1:17:43 PM By: Worthy Keeler PA-C Entered By: Worthy Keeler on 06/16/2021 13:17:43 Adam Merritt ([id number]) -------------------------------------------------------------------------------- Physician Orders Details Patient Name: Adam Merritt Date of Service: 06/16/2021 10:15 AM Medical Record Number: [id number] Patient Account Number: [id number] Date of Birth/Sex: 01-Apr-1946 (75 y.o. M) Treating RN: Carlene Coria Primary Care Provider: [name] Other Clinician: Referring Provider: [name] Treating Provider/Extender: Skipper Cliche in Treatment: 11 Verbal / Phone Orders: No Diagnosis Coding ICD-10 Coding Code Description I89.0 Lymphedema, not elsewhere classified I87.2 Venous insufficiency (chronic) (peripheral) L97.812 Non-pressure chronic ulcer of other part of right lower leg with fat layer exposed L97.822 Non-pressure chronic ulcer of other part of left lower leg with fat layer exposed L89.612 Pressure ulcer of right heel, stage 2 Follow-up Appointments o Return Appointment in 1 week. o Nurse Visit as needed  Bathing/ Shower/ Hygiene o May shower with wound dressing protected with water repellent cover or cast protector. o No tub bath. Anesthetic (Use 'Patient Medications' Section for Anesthetic Order Entry) o Lidocaine applied to wound bed Edema Control - Lymphedema / Segmental Compressive Device / Other o Optional: One layer of unna paste to top of compression wrap (to act as an anchor). o 4 Layer Compression System Lymphedema. - left lower leg, apply TCA and lotion to lower leg o Elevate, Exercise Daily and Avoid Standing for Long Periods of Time. o Elevate legs to the level of the heart and pump ankles as often as possible o Elevate leg(s) parallel to the floor when sitting. Wound Treatment Wound #43 - Calcaneus Wound Laterality: Right Cleanser: Soap and Water 1 x Per Week/30 Days Discharge Instructions:  Gently cleanse wound with antibacterial soap, rinse and pat dry prior to dressing wounds Topical: Triamcinolone Acetonide Cream, 0.1%, 15 (g) tube 1 x Per Week/30 Days Discharge Instructions: Apply as directed by provider. Primary Dressing: Silvercel Small 2x2 (in/in) 1 x Per Week/30 Days Discharge Instructions: Apply Silvercel Small 2x2 (in/in) as instructed Secondary Dressing: ABD Pad 5x9 (in/in) 1 x Per Week/30 Days Discharge Instructions: Cover with ABD pad Compression Wrap: Medichoice 4 layer Compression System, 35-40 mmHG 1 x Per Week/30 Days Discharge Instructions: Apply multi-layer wrap as directed. Electronic Signature(s) Signed: 06/16/2021 11:27:00 AM By: Carlene Coria RN Signed: 06/17/2021 9:05:45 AM By: Worthy Keeler PA-C Entered By: Carlene Coria on 06/16/2021 11:26:59 Adam Merritt ([id number]) -------------------------------------------------------------------------------- Problem List Details Patient Name: Adam Merritt Date of Service: 06/16/2021 10:15 AM Medical Record Number: [id number] Patient Account Number: [id number] Date of Birth/Sex: 1945-08-03 (75 y.o. M) Treating RN: Carlene Coria Primary Care Provider: [name] Other Clinician: Referring Provider: [name] Treating Provider/Extender: Skipper Cliche in Treatment: 11 Active Problems ICD-10 Encounter Code Description Active Date MDM Diagnosis I89.0 Lymphedema, not elsewhere classified 03/25/2021 No Yes I87.2 Venous insufficiency (chronic) (peripheral) 03/25/2021 No Yes L97.812 Non-pressure chronic ulcer of other part of right lower leg with fat layer 03/25/2021 No Yes exposed L97.822 Non-pressure chronic ulcer of other part of left lower leg with fat layer 03/25/2021 No Yes exposed L89.612 Pressure ulcer of right heel, stage 2 05/31/2021 No Yes Inactive Problems Resolved Problems Electronic Signature(s) Signed: 06/16/2021 10:40:20 AM By: Worthy Keeler PA-C Entered By: Worthy Keeler on  06/16/2021 10:40:20 Adam Merritt ([id number]) -------------------------------------------------------------------------------- Progress Note Details Patient Name: Adam Merritt Date of Service: 06/16/2021 10:15 AM Medical Record Number: [id number] Patient Account Number: [id number] Date of Birth/Sex: 04/07/1946 (75 y.o. M) Treating RN: Carlene Coria Primary Care Provider: [name] Other Clinician: Referring Provider: [name] Treating Provider/Extender: Skipper Cliche in Treatment: 11 Subjective Chief Complaint Information obtained from Patient Bilateral LE edema and weeping ulcers History of Present Illness (HPI) 10/18/17-He is here for initial evaluation of bilateral lower extremity ulcerations in the presence of venous insufficiency and lymphedema. He has been seen by vascular medicine in the past, Dr. Lucky Cowboy, last seen in 2016. He does have a history of abnormal ABIs, which is to be expected given his lymphedema and venous insufficiency. According to Epic, it appears that all attempts for arterial evaluation and/or angiography were not follow through with by patient. He does have a history of being seen in lymphedema clinic in 2018, stopped going approximately 6 months ago stating "it didn't do any good". He does not have lymphedema pumps, he does not have custom fit compression wrap/stockings. He is  diabetic and his recent A1c last month was 7.6. He admits to chronic bilateral lower extremity pain, no change in pain since blister and ulceration development. He is currently being treated with Levaquin for bronchitis. He has home health and we will continue. 10/25/17-He is here in follow-up evaluation for bilateral lower extremity ulcerationssubtle he remains on Levaquin for bronchitis. Right lower extremity with no evidence of drainage or ulceration, persistent left lower extremity ulceration. He states that home health has not been out since his appointment. He went to [facility] on Tuesday, studies revealed: RIGHT ABI 0.9, TBI 0.6 LEFT ABI 1.1, TBI 0.6 with triphasic flow bilaterally. We will continue his same treatment plan. He has been educated on compression therapy and need for elevation. He will benefit from lymphedema pumps 11/01/17-He is here in follow-up evaluation for left lower extremity ulcer. The right lower extremity remains healed. He has home health services, but they have not been out to see the patient for 2-3 weeks. He states it home health physical therapy changed his dressing yesterday after therapy; he placed Ace wrap compression. We are still waiting for lymphedema pumps, reordered d/t need for company change. 11/08/17-He is here in follow-up evaluation for left lower extremity ulcer. It is improved. Edema is significantly improved with compression therapy. We will continue with same treatment plan and he will follow-up next week. No word regarding lymphedema pumps 11/15/17-He is here in follow-up evaluation for left lower extremity ulcer. He is healed and will be discharged from wound care services. I have reached out to [facility] regarding his lymphedema pumps. They have been unable to reach the patient; the contact number they had with the patient's wife's cell phone and she has not answered any unrecognized calls. Contact should be made today, trial planned for next week; [facility] will continue to follow 11/27/17 on evaluation today patient has multiple blistered areas over the right lower extremity his left lower extremity appears to be doing okay. These blistered areas show signs of no infection which is great news. With that being said he did have some necrotic skin overlying which was mechanically debrided away with saline and gauze today without complication. Overall post debridement the wounds appear to be doing better but in general his swelling seems to be increased. This is obviously not good news. I think this  is what has given rise to the blisters. 12/04/17 on evaluation today patient presents for follow-up concerning his bilateral lower extremity edema in the right lower extremity ulcers. He has been tolerating the dressing changes without complication. With that being said he has had no real issues with the wraps which is also good news. Overall I'm pleased with the progress he's been making. 12/11/17 on evaluation today patient appears to be doing rather well in regard to his right lateral lower extremity ulcer. He's been tolerating the dressing changes without complication. Fortunately there does not appear to be any evidence of infection at this time. Overall I'm pleased with the progress that is being made. Unfortunately he has been in the hospital due to having what sounds to be a stomach virus/flu fortunately that is starting to get better. 12/18/17 on evaluation today patient actually appears to be doing very well in regard to his bilateral lower extremities the swelling is under fairly good control his lymphedema pumps are still not up and running quite yet. With that being said he does have several areas of opening noted as far as wounds are concerned  mainly over the left lower extremity. With that being said I do believe once he gets lymphedema pumps this would least help you mention some the fluid and preventing this from occurring. Hopefully that will be set up soon sleeves are Artie in place at his home he just waiting for the machine. 12/25/17 on evaluation today patient actually appears to be doing excellent in fact all of his ulcers appear to have resolved his legs appear very well. I do think he needs compression stockings we have discussed this and they are actually going to go to [place] today to elastic therapy to get this fitted for him. I think that is definitely a good thing to do. Readmission: 04/09/18 upon evaluation today this patient is seen for readmission due to bilateral lower  extremity lymphedema. He has significant swelling of his extremities especially on the left although the right is also swollen he has weeping from both sides. There are no obvious open wounds at this point. Fortunately he has been doing fairly well for quite a bit of time since I last saw him. Nonetheless unfortunately this seems to have reopened and is giving quite a bit of trouble. He states this began about a week ago when he first called [place] to get in to be seen. No fevers, chills, nausea, or vomiting noted at this time. He has not been using his lymphedema pumps due to the fact that they won't fit on his leg at this point likewise is also not been using his compression for essentially the same reason. 04/16/18 upon evaluation today patient actually appears to be doing a little better in regard to the fluid in his bilateral lower extremities. With that being said he's had three falls since I saw him last week. He also states that he's been feeling very poorly. I was concerned last week and feel like that the concern is still there as far as the congestion in his chest is concerned he seems to be breathing about the same as last week but again he states he's very weak he's not even able to walk further than from the chair to the door. His wife had to buy a wheelchair just to be able to get them out of the house to get to the appointment today. This has me very concerned. Adam Merritt, Adam Merritt ([id number]) 04/23/18 on evaluation today patient actually appears to be doing much better than last week's evaluation. At that time actually had to transport him to the ER via EMS and he subsequently was admitted for acute pulmonary edema, acute renal failure, and acute congestive heart failure. Fortunately he is doing much better. Apparently they did dialyze him and were able to take off roughly 35 pounds of fluid. Nonetheless he is feeling much better both in regard to his breathing and he's able to get around much  better at this time compared to previous. Overall I'm very happy with how things are at this time. There does not appear to be any evidence of infection currently. No fevers, chills, nausea, or vomiting noted at this time. 04/30/2018 patient seen today for follow-up and management of bilateral lower extremity lymphedema. He did express being more sad today than usual due to the recent loss of his dog. He states that he has been compliant with using the lymphedema pumps. However he does admit a minute over the last 2-3 days he has not been using the pumps due to the recent loss of his dog. At this time there  is no drainage or open wounds to his lower extremities. The left leg edema is measuring smaller today. Still has a significant amount of edema on bilateral lower extremities With dry flaky skin. He will be referred to the lymphedema clinic for further management. Will continue 3 layer compression wraps and follow-up in 1-2 weeks.Denies any pain, fever, chairs recently. No recent falls or injuries reported during this visit. 05/07/18 on evaluation today patient actually appears to be doing very well in regard to his lower extremities in general all things considered. With that being said he is having some pain in the legs just due to the amount of swelling. He does have an area where he had a blister on the left lateral lower extremity this is open at this point other than that there's nothing else weeping at this time. 05/14/18 on evaluation today patient actually appears to be doing excellent all things considered in regard to his lower extremities. He still has a couple areas of weeping on each leg which has continued to be the issue for him. He does have an appointment with the lymphedema clinic although this isn't until February 2020. That was the earliest they had. In the meantime he has continued to tolerate the compression wraps without complication. 05/28/18 on evaluation today patient  actually appears to be doing more poorly in regard to his left lower extremity where he has a wound open at this point. He also had a fall where he subsequently injured his right great toe which has led to an open wound at the site unfortunately. He has been tolerating the dressing changes without complication in general as far as the wraps are concerned that he has not been putting any dressing on the left 1st toe ulcer site. 06/11/18 on evaluation today patient appears to be doing much worse in regard to his bilateral lower extremity ulcers. He has been tolerating the dressing changes without complication although his legs have not been wrapped more recently. Overall I am not very pleased with the way his legs appear. I do believe he needs to be back in compression wraps he still has not received his compression wraps from the [facility] hospital as of yet. 06/18/18 on evaluation today patient actually appears to be doing significantly better than last time I saw him. He has been tolerating the compression wraps without complication in the circumferential ulcers especially appear to be doing much better. His toe ulcer on the right in regard to the great toe is better although not as good as the legs in my pinion. No fevers chills noted 07/02/18 on evaluation today patient appears to be doing much better in regard to his lower extremity ulcers. Unfortunately since I last saw him he's had the distal portion of his right great toe if he dated it sounds as if this actually went downhill very quickly. I had only seen him a few days prior and the toe did not appear to be infected at that point subsequently became infected very rapidly and it was decided by the surgeon that the distal portion of the toe needed to be removed. The patient seems to be doing well in this regard he tells me. With that being said his lower extremities are doing better from the standpoint of the wounds although he is significantly swollen at  this point. 07/09/18 on evaluation today patient appears to be doing better in regard to the wounds on his lower extremities. In fact everything is almost completely healed he  is just a small area on the left posterior lower extremity that is open at this point. 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 With that being said he needs some kind of compression it does sound like he may not be wearing his compression, that is the wraps, during the entire time between when he's here visit to visit. Apparently his wife took the current one off because it began to "fall apart". 07/16/18 on evaluation today patient appears to be extremely swollen especially in regard to his left lower extremity unfortunately. He also has a new skin tear over the left lower extremity and there's a smaller area on the right lower extremity as well. Unfortunately this seems to be due in part to blistering and fluid buildup in his leg. He did get the reduction wraps that were ordered by the [facility] hospital for him to go to lymphedema clinic. With that being said his wounds on the legs have not healed to the point to where they would likely accept them as a patient lymphedema clinic currently. We need to try to get this to heal. With that being said he's been taking his wraps off which is not doing him any favors at this point. In fact this is probably quite counterproductive compared to what needs to occur. We will likely need to increase to a four layer compression wrap and continue to also utilize elevation and he has to keep the wraps on not take them off as he's been doing currently hasn't had a wrap on since Saturday. 07/23/18 on evaluation today patient appears to be doing much better in regard to his bilateral lower extremities. In fact his left  lower extremity which was the largest is actually 15 cm smaller today compared to what it was last time he was here in our clinic. This is obviously good news after just one week. Nonetheless the differences he actually kept the wraps on during the entire week this time. That's not typical for him. I do believe he understands a little bit better now the severity of the situation and why it's important for him to keep these wraps on. 07/30/18 on evaluation today patient actually appears to be doing rather well in regard to his lower extremities. His legs are much smaller than they have been in the past and he actually has only one very small rudder superficial region remaining that is not closed on the left lateral/posterior lower extremity even this is almost completely close. I do believe likely next week he will be healed without any complications. I do think we need to continue the wraps however this seems to be beneficial for him. I also think it may be a good time for [place] to go ahead and see about getting the appointment with the lymphedema clinic which is supposed to be made for him in order to keep this moving along and hopefully get them into compression wraps that will in the end help him to remain healed. 08/06/18 on evaluation today patient actually appears to be doing very well in regard as bilateral lower extremities. In fact his wounds appear to be completely healed at this time. 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 He is in the process of getting appointment with the lymphedema clinic we  have made this referral were just waiting to hear back on the schedule time. We need to follow up on that today as well. 08/13/18 on evaluation today patient actually appears to be doing very well in regard to his bilateral lower extremities there are no open wounds at this point. We are gonna go ahead and see about ordering the Velcro  compression wraps for him had a discussion with them about [place] doing it versus the [place] they feel like they can definitely afford going ahead and get the wraps themselves and they would prefer to try to avoid having to go to the lymphedema clinic if it all possible which I completely understand. As long as he has good compression I'm okay either way. RICHRD, KUZNIAR ([id number]) 3/17//20 on evaluation today patient actually appears to be doing well in regard to his bilateral lower extremity ulcers. He has been tolerating the dressing changes without complication specifically the compression wraps. Overall is had no issues my fingers finding that I see at this point is that he is having trouble with constipation. He tells me he has not been able to go to the bathroom for about six days. He's taken to over-the- counter oral laxatives unfortunately this is not helping. He has not contacted his doctor. 08/27/18 on evaluation today patient appears to be doing fairly well in regard to his lower extremities at this point. There does not appear to be any new altars is swelling is very well controlled. We are still waiting for his Velcro compression wraps to arrive that should be sometime in the next week is Artie sent the check for these. 09/03/18 on evaluation today patient appears to be doing excellent in regard to his bilateral lower extremities which he shows no signs of wound openings in regard to this point. He does have his Velcro compression wraps which did arrive in the mail since I saw him last week. Overall he is doing excellent in my pinion. 09/13/18 on evaluation today patient appears to be doing very well currently in regard to the overall appearance of his bilateral lower extremities although he's a little bit more swollen than last time we saw him. At that point he been discharged without any open wounds. Nonetheless he has a small open wound on the posterior left lower extremity with some evidence  of cellulitis noted as well. Fortunately I feel like he has made good progress overall with regard to his lower extremities from were things used to be. 09/20/18 on evaluation today patient actually appears to be doing much better. The erythematous lower extremity is improving wound itself which is still open appears to be doing much better as far as but appearance as well as pain is concerned overall very pleased in this regard. There's no signs of active infection at this time. 09/27/18 on evaluation today patient's wounds on the lower extremity actually appear to be doing fairly well at this time which is good news. There is no evidence of active infection currently and again is just as left lower extremity were there any wounds at this point anyway. I believe they may be completely healed but again I'm not 100% sure based on evaluation today. I think one more week of observation would likely be a good idea. 10/04/18 on evaluation today patient actually appears to be doing excellent in regard to the left lower extremity which actually appears to be completely healed as of today. Unfortunately he's been having issues with his right lower extremity  have a new wound that has opened. Fortunately there's no evidence of active infection at this time which is good news. 10/10/18 upon evaluation today patient actually appears to be doing a little bit worse with the new open area on his right posterior lower extremity. He's been tolerating the dressing changes without complication. Right now we been using his compression wraps although I think we may need to switch back to actually performing bilateral compression wraps are in the clinic. No fevers, chills, nausea, or vomiting noted at this time. 10/17/18 on evaluation today patient actually appears to be doing quite well in regard to his bilateral lower extremity ulcers. He has been tolerating the reps without complication although he would prefer not to rewrap  his legs as of today. Fortunately there's no signs of active infection which is good news. No fevers, chills, nausea, or vomiting noted at this time. 10/24/18 on evaluation today patient appears to be doing very well in regard to his lower extremities. His right lower extremity is shown signs of healing and his left lower Trinity though not healed appears to be improving which is excellent news. Overall very pleased with how things seem to be progressing at this time. The patient is likewise happy to hear this. His Velcro compression wraps however have not been put on properly will gonna show his wife how to do that properly today. 10/31/18 on evaluation today patient appears to be doing more poorly in regard to his left lower extremity in particular although both lower extremities actually are showing some signs of being worse than my previous evaluation. Unfortunately I'm just not sure that his compression stockings even with the use of the compression/lymphedema pumps seem to be controlling this well. Upon further questioning he tells me that he also is not able to lie flat in the bed due to his congestive heart failure and difficulty breathing. For that reason he sleeps in his recliner. To make matters worse his recliner also cannot even hold his legs up so instead of even being somewhat elevated they pretty much hang down to the floor. This is the way he sleeps each night which is definitely counterproductive to everything else that were attempting to do from the standpoint of controlling his fluid. Nonetheless I think that he potentially could benefit from a hospital bed although this would be something that his primary care provider would likely have to order since anything that is order on our side has to be directly related to wound care and again the hospital bed is not necessarily a direct relation to although I think it does contribute to his overall wound status on his lower  extremities. 11/07/18 on evaluation today patient appears to be doing better in regard to his bilateral lower extremity. He's been tolerating the dressing changes without complication. We did in the interim since I last saw him switch to just using extras orbiting new alginate and that seems to have done much better for him. I'm very pleased with the overall progress that is made. 11/14/18 on evaluation today patient appears to be redoing rather well in regard to his left lower extremity ulcers which are the only ones remaining at this point. Fortunately there's no signs of active infection at this time which is good news. Overall been very pleased with how things seem to be progressing currently. No fevers, chills, nausea, or vomiting noted at this time. 11/20/17 on evaluation today patient actually appears to be doing quite well in regard to  his lower extremities on the left on the right he has several blisters that showed up although there's some question about whether or not he has had his broker compression wraps on like he was supposed to or not. Fortunately there's no signs of active infection at this time which is good news. Unfortunately though he is doing well from the standpoint of the left leg the right leg is not doing as well again this is when we did not wrap last week. 11/28/18 on evaluation today patient appears to be doing well in regard to his bilateral lower extremities is left appears to be healed is right is not healed but is very close to being so. Overall very pleased with how things seem to be progressing. Patient is likewise happy that things are doing well. 12/09/18 on evaluation today patient actually appears to be completely healed which is excellent news. He actually seems to be doing well in regard to the swelling of the bilateral lower extremities which is also great news. Overall very pleased with how things seem to be progressing. Readmission: 03/18/2019 upon evaluation  today patient presents for reevaluation concerning issues that he is having with his left lower extremity where he does have a wound noted upon inspection today. He also has a wound on the right second toe on the tip where it is apparently been rubbing in his shoe I did look at issues and you can actually see where this has been occurring as well. Fortunately there is no signs of infection with regard to Adam Merritt, Adam Merritt. ([id number]) the toe necessarily although it is very swollen compared to normal that does have me somewhat concerned about the possibility of further evaluation for infection/osteomyelitis. I would recommend an x-ray to start with. Otherwise he states that it is been 1-2 weeks that he has had the draining in regard to left lower extremity he does not know how this happened he has been wearing his compression appropriately and I do see that as well today but nonetheless I do think that he needs to continue to be very cautious with regard to elevation as well. 03/25/2019 on evaluation today patient appears to be doing much better in regard to his lower extremity at this time. That is on the left. He did culture positive for Pseudomonas but the good news is he seems to be doing much better the gentamicin we applied topically seems to have done a great job for him. There is no signs of active infection at this time systemically and locally he is doing much better. No fevers, chills, nausea, vomiting, or diarrhea. In regard to his right toe this seems to be doing much better there is very little pressure noted at this point I did clean up some of the callus today around the edges of the wound as well as the surface of the wound but to be honest he is progressing quite nicely. 10/30; the area on the left anterior lower tibia area is healed over. His edema control is adequate even though his use of the compression pumps seems very intermittent. He has a juxta lite stocking on the right leg and  a think there is 1 for the left leg at home. His wife is putting these on. He has an area on the plantar right second toe which is a hammertoe they are trying to offload this 04/08/2019 on evaluation today patient appears to be doing well in regard to his lower extremities which is good news. We  are seeing him today for his toe ulcer which is giving him a little bit more trouble but again still seems to be doing better at this point which is good news. He is going require some sharp debridement in regard to the toe today however. 04/15/2019 on evaluation today patient actually appears to be doing quite well with regard to his toe ulcer. I am very pleased with how things are doing in that regard. With regard to his lower extremity edema in general he tells me he is not been using the lymphedema pumps regularly although he does not use them. I think that he needs to use them more regularly he does have a small blister on the left lower extremity this is not open at this point but obviously it something that can get worse if he does not keep the compression going and the pumps going as well. 04/22/2019 on evaluation today patient appears to be doing well with regard to his toe ulcer. He has been tolerating the dressing changes without complication. This is measuring slightly smaller this week as compared to last week. Fortunately there is no signs of active infection at this time. 05/06/2019 on evaluation today patient actually appears to be doing excellent. In fact his toe appears to be completely healed which is great news. There is no signs of active infection at this time. Readmission: Patient presents today for follow-up after having been discharged at the beginning of December 2020. He states that in recent weeks things have reopened and has been having more trouble at this point. With that being said fortunately there is no signs of active infection at this time. No fever chills noted. Nonetheless  he also does have an issue with one of his toes as well that seems to be somewhat this is on the right foot second toe. 07/25/2019 upon evaluation today patient's lower extremities appear to be doing excellent bilaterally. I really feel like he is showing signs of great improvement and in fact I think he is close to healing which is great news. There is no signs of active infection at this time. No fevers, chills, nausea, vomiting, or diarrhea. 07/31/2019 upon evaluation today patient appears to be doing excellent and in fact I think may be completely healed in regard to his right lower extremity that were still to monitor this 1 more week before closing it out. The left lower extremity is measuring smaller although not completely closed seems to be doing excellent. 08/07/2019 upon evaluation today patient appears to be doing excellent in regard to his wounds. In fact the right lower extremity is completely healed there is no issues here. The left lower extremity has 1 very small area still remaining fortunately there is no signs of infection and overall I think he is very close to closure of the here as well. 08/14/19 upon evaluation today patient appears to be doing excellent in regard to his lower extremities. In fact he appears to be completely healed as of today. Fortunately there's no signs of active infection at this time. No fevers, chills, nausea, or vomiting noted at this time. READMISSION 09/26/2019 This is a 76 year old man who is well-known to this clinic having just been discharged on 08/14/2019. He has known lymphedema and chronic venous insufficiency. He has Farrow wrap stockings and also external compression pumps. He does not use the external compression pumps however. He does however apparently fairly faithfully use the stockings. They have not been lotion in his legs. He has  developed new wounds on the right anterior as well as the left medial left lateral and left posterior calf. He  returns for our review of this Past medical history includes coronary artery disease, hypertension, congestive heart failure, COPD, venous insufficiency with lymphedema, type 2 diabetes. He apparently has compression pumps, Farrow wraps and at 1 point a hospital bed although I believe he sleeps in a recliner 10/03/2019 upon evaluation today patient appears to be doing better with regard to his wounds in general. He has been tolerating the dressing changes without complication. Fortunately there is no signs of active infection. No fevers, chills, nausea, vomiting, or diarrhea. I do believe the compression wraps are helping as they always have in the past. 10/10/2019 upon evaluation today patient appears to be doing excellent at this point. His right lower extremity is measuring much better and in fact is completely healed. His left lower extremity is also measuring better though not healed seems to be doing excellent at this point which is great news. Overall I am very pleased with how things appear currently. 10/27/2019 upon evaluation today patient appears to be doing quite well with regard to his wounds currently. He has been tolerating the dressing changes without complication. Fortunately there is no signs of active infection at this time. In fact he is almost completely healed on the left and has just a very small area open and on the right he is completely healed. 11/04/2019 upon evaluation today patient appears to be doing quite well with regard to his wounds. In fact he appears to be quite possibly completely healed on the left lower extremity now as well the right lower extremity is still doing well. With that being said unfortunately though this may be healed I think it is a very fragile area and I cannot even confirm there is not a very small opening still remaining. I really think he may benefit from 1 additional weeks wrapping before discontinuing. Adam Merritt, Adam Merritt ([id number]) 11/10/2019 upon  evaluation today patient appears to be doing well in regard to his original wounds in fact everything that we were treating last week is completely healed on the left. Unfortunately he has a new skin tear just above where the wrap slipped down due to a fall he sustained causing this injury. 11/17/2019 on evaluation today patient appears to be doing better with regard to his wound. He has been tolerating the dressing changes without complication. Fortunately there is no signs of active infection at this time. No fevers, chills, nausea, vomiting, or diarrhea. 11/24/2019 upon evaluation today patient appears to be doing well with regard to his wound. This is making good progress there does not appear to be signs of active infection but overall I do feel like he is headed in the correct direction. Overall he is tolerating the compression wrap as well. 12/02/2019 upon evaluation today patient appears to be doing well with regard to his leg ulcer. In fact this appears to be completely healed and this is the last of the wounds that he had but still the left anterior lower extremity. Fortunately there is no signs of active infection at this time. No fevers, chills, nausea, vomiting, or diarrhea. Readmission: 02/05/2020 on evaluation today patient appears to be doing well with regard to his wound all things considered. He actually tells me that a couple weeks ago he had trouble with his wraps and therefore took him off for a couple of days in order to give his legs a break. It  was during this time that he actually developed increased swelling and edema of his legs and blisters on both legs. That is when he called to make the appointment. Nonetheless since that time he began wearing his compression wraps which are Velcro in the meantime and has done extremely well with this. In fact his leg appears to be under good control as far as edema is concerned today it is nothing like what I am seeing when he has come in  for evaluations in the past. They have been using some of the silver alginate at home which has been beneficial for him. Fortunately there is no signs of active infection at this time. No fevers, chills, nausea, vomiting, or diarrhea. 02/19/2020 on evaluation today patient unfortunately does not appear to be doing quite as well as I would like to see. He has no signs of active infection at this time but unfortunately he is not doing well in regard to the overall appearance of his left leg. He has a couple other areas that are weeping now and the leg is much more swollen. I think that he needs to actually have a compression wrap to try to manage this. 02/26/2020 on evaluation today patient appears to be doing well with regard to his left lower extremity ulcer. Fortunately the swelling is down I do believe the pressure wrap seems to be doing quite well. Fortunately there is no signs of active infection at this time. 03/05/2020 upon evaluation today patient appears to be doing excellent in regard to his legs at this point. Fortunately there is no signs of active infection which is great news. Overall he feels like he is completely healed which is great news. Readmission: 05/20/2020 upon evaluation today patient appears to be doing well with regard to his leg ulcer all things considered. He is actually coming back to see [place] after having had a reopening of the ulcers on his left leg in the past several weeks. He is out of dressing supplies and his wife is been try to take care of this as best she can. 06/10/2020 upon evaluation today patient unfortunately appears to be doing significantly worse today compared to when I last saw him. He and his wife both tell me that "there has been a lot going on". I did not actually go into detail about everything that has been happening but nonetheless he has not obviously been wearing his compression. He brings them with him today but they were not on. I am not even certain  to be honest that he could get those on at this point. Nonetheless I do believe that he is going to require compression wrapping at some point shortly but right now we need to try to get what appears to be infection under control at this time. 06/28/2020 upon evaluation today patient appears to be doing well with regard to 06/28/2020 upon evaluation today patient appears to be doing well pretty well in regard to his left leg which is much better than previous. With that being said in regard to his right leg he does seem to be having some issues with a new wound after he sustained a fall. This fortunately is not too deep but does appear to be something we need to address. Neither deep which is good. 07/05/2020 on evaluation today patient appears to be doing well with regard to his wounds. In fact everything on the left leg appears to be pretty much healed on the right leg this is very  close though not completely closed yet. Fortunately there is no evidence of active infection at this time. No fevers, chills, nausea, vomiting, or diarrhea. 07/13/2019 upon evaluation today patient appears to be making good progress which is great news. Overall I am extremely pleased with where things stand today. There does not appear to be any signs of active infection which is great news and overall his left leg appears healed still the right leg though not healed does seem to be making good progress which is excellent news. He is having no pain. 07/19/2020 on evaluation today patient appears to be doing well with regard to his wound. He has been tolerating the dressing changes without complication. Fortunately there does not appear to be any signs of active infection at this time. No fevers, chills, nausea, vomiting, or diarrhea. Readmission: 08/20/2020 on evaluation today patient presents for reevaluation here in the clinic concerning issues that he has been having with his bilateral lower extremities. Unfortunately this  is an ongoing reoccurring issue. He tells me that he is really not certain exactly what caused the issue although as I discussed this further with him it appears that he has been sitting for the most part and sleeping in his chair with his feet on the ground he is not really elevating at all in fact the chair does not even have a reclining function therefore it is basically just him sitting with his feet on the ground. I think this alone has probably led to his increased swelling he is also having more difficulty walking which means that he is pumping less blood flow through his legs anyway as far as the venous stasis is concerned. All in all I think this is leading to a downward spiral of him not being able to walk very well as his legs are so heavy, they begin to weep, and overall he just does not seem to be doing as well as it was when I last saw him. 08/27/2020 upon evaluation today patient's legs actually are showing signs of improvement which is great news. There does not appear to be any evidence of infection which is also excellent news. I am extremely pleased with where things stand today. No fevers, chills, nausea, vomiting, or diarrhea. 09/03/20 upon evaluation today patient appears to be doing excellent in regard to his wounds currently. He has been tolerating the dressing changes without complication and overall I am extremely pleased with where things stand today. No fevers, chills, nausea, vomiting, or diarrhea. Overall he is healed on the right leg although the left leg not being completely healed still is doing significantly better. Adam Merritt, Adam Merritt ([id number]) 09/10/2020/8/22 upon evaluation today patient appears to be doing well with regard to his wounds. He has been tolerating the dressing changes without complication. Fortunately he appears to be completely healed. I am very pleased in that regard. As is the patient his right leg is also doing well he did get a new recliner which is  helping him keep his feet off the ground which I think is helped he is down 2 cm on the left compared to last week so I think there is some definite improvement here. Readmission 12/15/20: Mr. Jhamari Markowicz is a 76 year old male with a past medical history of lymphedema, insulin-dependent type 2 diabetes, COPD and congestive heart failure that presents to the clinic for increased drainage from his legs bilaterally, Left greater than right. Patient has lymphedema. He has been seen in our clinic on multiple occasions for  this issue. He currently denies signs of infection. He has not been using his lymphedema pumps. 12/23/2020 upon evaluation today patient appears to be doing well with regard to his wounds. He has been tolerating the dressing changes he really does not have any open wounds just weeping areas of the legs bilaterally and fortunately seems to be doing better at this point. 12/28/2020 upon evaluation today patient's wound is actually showing signs of good improvement. There does not appear to be any evidence of infection which is great news and overall I am extremely pleased at this point. I think is very close to complete resolution. 01/04/2021 upon evaluation today patient actually appears to be completely healed which is great news. I do not see any signs of active infection currently which is also great news. In general I am extremely pleased with where we stand and I do believe that the patient is tolerating the dressing changes without complication. I believe he is ready to go back into his Velcro compression and I did see that they brought them with him today. Readmission: Patient presents today for follow-up he was last seen August 2. He is having some issues with some mild drainage and leaking from the left leg. This is something that has reopened and again is a recurrent issue for him. Subsequently he does wear his Velcro compression wraps though I think we may want to look into  something a little bit better for him I am thinking that we may want to try the juxta fit compression which I think would do much more effective compression treatment overall for him. 04/08/2021 upon evaluation today patient appears to be doing well with regard to his legs. I do feel like he is showing signs of improvement which is great news. No fevers, chills, nausea, vomiting, or diarrhea. 04/15/2021 upon evaluation today patient's wounds actually appear to be very close to complete resolution. He is legs are actually doing well left leg just has a small area of leaking at this point. Overall I think that we are very close to complete resolution based on what I am seeing. Nonetheless he seems to not be feeling too well today I really do not know what exactly is going on here. 04/22/2021 upon evaluation patient appears to be completely healed based on what I see today. I do not see anything open both legs appear to be doing quite well. 05/20/2021 upon evaluation today patient appears to be doing poorly in regard to his legs. He has not been a full month since we last saw him and he has reopened unfortunately. He does not appear to be any signs of active infection at this time which is great news. No fevers, chills, nausea, vomiting, or diarrhea. 12/27; the patient comes in the clinic today with severe bilateral lymphedema very dry skin but paradoxically his wounds are actually healed or should I say not open. He has compression pumps that he does not use. [Once per week] he does not have compression stockings. He comes in today with a small presumably pressure ulcer on the lateral plantar heel on the right. The patient states he has been complaining about pain in this area for about 2 months although no wound was recognized until our intake nurse was doing her exam 06/16/2021 upon evaluation today patient appears to be doing better in regard to his wounds. He is not completely healed yet especially  in regard to the right heel which is still open. Nonetheless I do think  that his left leg is almost completely healed the right leg appears to be doing decently well his right heel is what still mainly open currently. Objective Constitutional Obese and well-hydrated in no acute distress. Vitals Time Taken: 10:40 AM, Height: 66 in, Weight: 300 lbs, BMI: 48.4, Temperature: 98.4 F, Pulse: 59 bpm, Respiratory Rate: 18 breaths/min, Blood Pressure: 140/65 mmHg. Respiratory normal breathing without difficulty. Psychiatric this patient is able to make decisions and demonstrates good insight into disease process. Alert and Oriented x 3. pleasant and cooperative. General Notes: Patient's wounds are showing signs of significant improvement which is great news and overall I think it would make an excellent Adam Merritt, Adam Merritt. ([id number]) progress. I Georgina Peer go ahead and recommend that we continue as such with the silver alginate dressing to any open wound locations. Integumentary (Hair, Skin) Wound #43 status is Open. Original cause of wound was Gradually Appeared. The date acquired was: 05/31/2021. The wound has been in treatment 2 weeks. The wound is located on the Right Calcaneus. The wound measures 1cm length x 1.1cm width x 0.3cm depth; 0.864cm^2 area and 0.259cm^3 volume. There is Fat Layer (Subcutaneous Tissue) exposed. There is no tunneling or undermining noted. There is a medium amount of serosanguineous drainage noted. There is small (1-33%) pink granulation within the wound bed. There is a medium (34-66%) amount of necrotic tissue within the wound bed including Adherent Slough. Assessment Active Problems ICD-10 Lymphedema, not elsewhere classified Venous insufficiency (chronic) (peripheral) Non-pressure chronic ulcer of other part of right lower leg with fat layer exposed Non-pressure chronic ulcer of other part of left lower leg with fat layer exposed Pressure ulcer of right heel, stage  2 Procedures Wound #43 Pre-procedure diagnosis of Wound #43 is a Diabetic Wound/Ulcer of the Lower Extremity located on the Right Calcaneus . There was a Four Layer Compression Therapy Procedure by Carlene Coria, RN. Post procedure Diagnosis Wound #43: Same as Pre-Procedure There was a Four Layer Compression Therapy Procedure by Carlene Coria, RN. Post procedure Diagnosis Wound #: Same as Pre-Procedure Plan Follow-up Appointments: Return Appointment in 1 week. Nurse Visit as needed Bathing/ Shower/ Hygiene: May shower with wound dressing protected with water repellent cover or cast protector. No tub bath. Anesthetic (Use 'Patient Medications' Section for Anesthetic Order Entry): Lidocaine applied to wound bed Edema Control - Lymphedema / Segmental Compressive Device / Other: Optional: One layer of unna paste to top of compression wrap (to act as an anchor). 4 Layer Compression System Lymphedema. - left lower leg, apply TCA and lotion to lower leg Elevate, Exercise Daily and Avoid Standing for Long Periods of Time. Elevate legs to the level of the heart and pump ankles as often as possible Elevate leg(s) parallel to the floor when sitting. WOUND #43: - Calcaneus Wound Laterality: Right Cleanser: Soap and Water 1 x Per Week/30 Days Discharge Instructions: Gently cleanse wound with antibacterial soap, rinse and pat dry prior to dressing wounds Topical: Triamcinolone Acetonide Cream, 0.1%, 15 (g) tube 1 x Per Week/30 Days Discharge Instructions: Apply as directed by provider. Primary Dressing: Silvercel Small 2x2 (in/in) 1 x Per Week/30 Days Discharge Instructions: Apply Silvercel Small 2x2 (in/in) as instructed Secondary Dressing: ABD Pad 5x9 (in/in) 1 x Per Week/30 Days Discharge Instructions: Cover with ABD pad Compression Wrap: Medichoice 4 layer Compression System, 35-40 mmHG 1 x Per Week/30 Days Discharge Instructions: Apply multi-layer wrap as directed. Adam Merritt, Adam Merritt.  ([id number]) 1. I would recommend that we continue with silver alginate to any open wounds  which I think is still to be the ideal thing to do. 2. I am also can recommend that we have the patient continue with the 4-layer compression wraps bilaterally which I think has been beneficial. 3. He should also continue to use his pumps at home as well as keeping his legs elevated as well. We will see patient back for reevaluation in 1 week here in the clinic. If anything worsens or changes patient will contact our office for additional recommendations. Electronic Signature(s) Signed: 06/16/2021 1:18:05 PM By: Worthy Keeler PA-C Entered By: Worthy Keeler on 06/16/2021 13:18:05 Adam Merritt ([id number]) -------------------------------------------------------------------------------- SuperBill Details Patient Name: Adam Merritt Date of Service: 06/16/2021 Medical Record Number: [id number] Patient Account Number: [id number] Date of Birth/Sex: 1946/02/03 (75 y.o. 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

## 2021-06-17 NOTE — Progress Notes (Signed)
DONA, KLEMANN ([id number]) Visit Report for 06/16/2021 Arrival Information Details Patient Name: NUNZIO, BANET Date of Service: 06/16/2021 10:15 AM Medical Record Number: [id number] Patient Account Number: [id number] Date of Birth/Sex: Aug 27, 1945 (75 y.o. M) Treating RN: Carlene Coria Primary Care Kanya Potteiger: [name] Other Clinician: Referring Alhassan Everingham: [name] Treating Vestal Markin/Extender: Skipper Cliche in Treatment: 11 Visit Information History Since Last Visit All ordered tests and consults were completed: No Patient Arrived: Wheel Chair Added or deleted any medications: No Arrival Time: 10:40 Any new allergies or adverse reactions: No Accompanied By: self Had a fall or experienced change in No Transfer Assistance: None activities of daily living that may affect Patient Identification Verified: Yes risk of falls: Secondary Verification Process Completed: Yes Signs or symptoms of abuse/neglect since last visito No Patient Requires Transmission-Based Precautions: No Hospitalized since last visit: No Patient Has Alerts: Yes Implantable device outside of the clinic excluding No Patient Alerts: diabetic type 2 cellular tissue based products placed in the center since last visit: Has Dressing in Place as Prescribed: Yes Has Compression in Place as Prescribed: Yes Pain Present Now: No Electronic Signature(s) Signed: 06/17/2021 4:26:12 PM By: Carlene Coria RN Entered By: Carlene Coria on 06/16/2021 10:40:22 [name] ([id number]) -------------------------------------------------------------------------------- Clinic Level of Care Assessment Details Patient Name: [name] Date of Service: 06/16/2021 10:15 AM Medical Record Number: [id number] Patient Account Number: [id number] Date of Birth/Sex: [birth year] (75 y.o. M) Treating RN: Carlene Coria Primary Care Enda Santo: [name] Other Clinician: Referring Derionna Salvador: [name] Treating Peyson Postema/Extender: Skipper Cliche in Treatment: 11 Clinic Level of Care Assessment Items TOOL 1 Quantity Score []  - Use when EandM and Procedure is performed on INITIAL visit 0 ASSESSMENTS - Nursing Assessment / Reassessment []  - General Physical Exam (combine w/ comprehensive assessment (listed just below) when performed on new 0 pt. evals) []  - 0 Comprehensive Assessment (HX, ROS, Risk Assessments, Wounds Hx, etc.) ASSESSMENTS - Wound and Skin Assessment / Reassessment []  - Dermatologic / Skin Assessment (not related to wound area) 0 ASSESSMENTS - Ostomy and/or Continence Assessment and Care []  - Incontinence Assessment and Management 0 []  - 0 Ostomy Care Assessment and Management (repouching, etc.) PROCESS - Coordination of Care []  - Simple Patient / Family Education for ongoing care 0 []  - 0 Complex (extensive) Patient / Family Education for ongoing care []  - 0 Staff obtains Programmer, systems, Records, Test Results / Process Orders []  - 0 Staff telephones HHA, Nursing Homes / Clarify orders / etc []  - 0 Routine Transfer to another Facility (non-emergent condition) []  - 0 Routine Hospital Admission (non-emergent condition) []  - 0 New Admissions / Biomedical engineer / Ordering NPWT, Apligraf, etc. []  - 0 Emergency Hospital Admission (emergent condition) PROCESS - Special Needs []  - Pediatric / Minor Patient Management 0 []  - 0 Isolation Patient Management []  - 0 Hearing / Language / Visual special needs []  - 0 Assessment of Community assistance (transportation, D/C planning, etc.) []  - 0 Additional assistance / Altered mentation []  - 0 Support Surface(s) Assessment (bed, cushion, seat, etc.) INTERVENTIONS - Miscellaneous []  - External ear exam 0 []  - 0 Patient Transfer (multiple staff / Civil Service fast streamer / Similar devices) []  - 0 Simple Staple / Suture removal (25 or less) []  - 0 Complex Staple / Suture removal (26 or more) []  - 0 Hypo/Hyperglycemic Management (do not check if billed  separately) []  - 0 Ankle / Brachial Index (ABI) - do not check if billed separately Has the patient been seen  at the hospital within the last three years: Yes Total Score: 0 Level Of Care: ____ [name] ([id number]) Electronic Signature(s) Signed: 06/17/2021 4:26:12 PM By: Carlene Coria RN Entered By: Carlene Coria on 06/16/2021 11:27:30 [name] ([id number]) -------------------------------------------------------------------------------- Compression Therapy Details Patient Name: [name] Date of Service: 06/16/2021 10:15 AM Medical Record Number: [id number] Patient Account Number: [id number] Date of Birth/Sex: September 28, 1945 (75 y.o. M) Treating RN: Carlene Coria Primary Care Torryn Fiske: [name] Other Clinician: Referring Donni Oglesby: [name] Treating Araiya Tilmon/Extender: Skipper Cliche in Treatment: 11 Compression Therapy Performed for Wound Assessment: Wound #43 Right Calcaneus Performed By: Clinician Carlene Coria, RN Compression Type: Four Layer Post Procedure Diagnosis Same as Pre-procedure Electronic Signature(s) Signed: 06/16/2021 11:25:48 AM By: Carlene Coria RN Entered By: Carlene Coria on 06/16/2021 11:25:47 [name] ([id number]) -------------------------------------------------------------------------------- Compression Therapy Details Patient Name: [name] Date of Service: 06/16/2021 10:15 AM Medical Record Number: [id number] Patient Account Number: [id number] Date of Birth/Sex: 1945-10-29 (75 y.o. M) Treating RN: Carlene Coria Primary Care Edelmira Gallogly: [name] Other Clinician: Referring Mercer Stallworth: [name] Treating Clyde Zarrella/Extender: Skipper Cliche in Treatment: 11 Compression Therapy Performed for Wound Assessment: NonWound Condition Lymphedema - Left Leg Performed By: Clinician Carlene Coria, RN Compression Type: Four Layer Post Procedure Diagnosis Same as Pre-procedure Electronic Signature(s) Signed: 06/16/2021 11:26:07 AM By:  Carlene Coria RN Entered By: Carlene Coria on 06/16/2021 11:26:07 [name] ([id number]) -------------------------------------------------------------------------------- Encounter Discharge Information Details Patient Name: [name] Date of Service: 06/16/2021 10:15 AM Medical Record Number: [id number] Patient Account Number: [id number] Date of Birth/Sex: 05-17-1946 (75 y.o. M) Treating RN: Carlene Coria Primary Care Aizley Stenseth: [name] Other Clinician: Referring Kaley Jutras: [name] Treating Naje Rice/Extender: Skipper Cliche in Treatment: 11 Encounter Discharge Information Items Discharge Condition: Stable Ambulatory Status: Wheelchair Discharge Destination: Home Transportation: Private Auto Accompanied By: self Schedule Follow-up Appointment: Yes Clinical Summary of Care: Patient Declined Electronic Signature(s) Signed: 06/16/2021 11:29:48 AM By: Carlene Coria RN Entered By: Carlene Coria on 06/16/2021 11:29:48 [name] ([id number]) -------------------------------------------------------------------------------- Lower Extremity Assessment Details Patient Name: [name] Date of Service: 06/16/2021 10:15 AM Medical Record Number: [id number] Patient Account Number: [id number] Date of Birth/Sex: 08-12-1945 (75 y.o. M) Treating RN: Carlene Coria Primary Care Valen Mascaro: [name] Other Clinician: Referring Lindyn Vossler: [name] Treating Danae Oland/Extender: Jeri Cos [name]: 11 Edema Assessment Assessed: [Left: No] [Right: No] Edema: [Left: Yes] [Right: Yes] Calf Left: Right: Point of Measurement: 36 cm From Medial Instep 52 cm 45 cm Ankle Left: Right: Point of Measurement: 10 cm From Medial Instep 40 cm 30.5 cm Vascular Assessment Pulses: Dorsalis Pedis Palpable: [Left:Yes] [Right:Yes] Electronic Signature(s) Signed: 06/17/2021 4:26:12 PM By: Carlene Coria RN Entered By: Carlene Coria on 06/16/2021 10:50:25 [name]  ([id number]) -------------------------------------------------------------------------------- Multi Wound Chart Details Patient Name: [name] Date of Service: 06/16/2021 10:15 AM Medical Record Number: [id number] Patient Account Number: [id number] Date of Birth/Sex: 12-19-1945 (75 y.o. M) Treating RN: Carlene Coria Primary Care Justa Hatchell: [name] Other Clinician: Referring Darthula Desa: [name] Treating Alexus Galka/Extender: Skipper Cliche in Treatment: 11 Vital Signs Height(in): 57 Pulse(bpm): 71 Weight(lbs): 300 Blood Pressure(mmHg): 140/65 Body Mass Index(BMI): 48 Temperature(F): 98.4 Respiratory Rate(breaths/min): 18 Photos: [N/A:N/A] Wound Location: Right Calcaneus N/A N/A Wounding Event: Gradually Appeared N/A N/A Primary Etiology: Diabetic Wound/Ulcer of the Lower N/A N/A Extremity Comorbid History: Anemia, Lymphedema, Chronic N/A N/A Obstructive Pulmonary Disease (COPD), Sleep Apnea, Congestive Heart Failure, Coronary Artery Disease, Hypertension, Peripheral Venous Disease, Type II Diabetes, Osteoarthritis, Neuropathy,  Received Chemotherapy Date Acquired: 05/31/2021 N/A N/A Weeks of Treatment: 2 N/A N/A Wound Status: Open N/A N/A Measurements L x W x D (cm) 1x1.1x0.3 N/A N/A Area (cm) : 0.864 N/A N/A Volume (cm) : 0.259 N/A N/A % Reduction in Area: 42.10% N/A N/A % Reduction in Volume: 13.10% N/A N/A Classification: Grade 1 N/A N/A Exudate Amount: Medium N/A N/A Exudate Type: Serosanguineous N/A N/A Exudate Color: red, brown N/A N/A Granulation Amount: Small (1-33%) N/A N/A Granulation Quality: Pink N/A N/A Necrotic Amount: Medium (34-66%) N/A N/A Exposed Structures: Fat Layer (Subcutaneous Tissue): N/A N/A Yes Epithelialization: Small (1-33%) N/A N/A Treatment Notes Electronic Signature(s) Signed: 06/16/2021 11:25:21 AM By: Carlene Coria RN Entered By: Carlene Coria on 06/16/2021 11:25:21 [name]  ([id number]) -------------------------------------------------------------------------------- [place] Details Patient Name: [name] Date of Service: 06/16/2021 10:15 AM Medical Record Number: [id number] Patient Account Number: [id number] Date of Birth/Sex: 1945-08-28 (75 y.o. M) Treating RN: Carlene Coria Primary Care Mariah Gerstenberger: [name] Other Clinician: Referring Antrice Pal: [name] Treating Arlene Genova/Extender: Skipper Cliche in Treatment: 11 Active Inactive Electronic Signature(s) Signed: 06/16/2021 11:25:06 AM By: Carlene Coria RN Entered By: Carlene Coria on 06/16/2021 11:25:05 [name] ([id number]) -------------------------------------------------------------------------------- Pain Assessment Details Patient Name: [name] Date of Service: 06/16/2021 10:15 AM Medical Record Number: [id number] Patient Account Number: [id number] Date of Birth/Sex: 20-May-1946 (75 y.o. M) Treating RN: Carlene Coria Primary Care Aeden Matranga: [name] Other Clinician: Referring Timika Muench: [name] Treating Alyze Lauf/Extender: Skipper Cliche in Treatment: 11 Active Problems Location of Pain Severity and Description of Pain Patient Has Paino No Site Locations Pain Management and Medication Current Pain Management: Electronic Signature(s) Signed: 06/17/2021 4:26:12 PM By: Carlene Coria RN Entered By: Carlene Coria on 06/16/2021 10:40:46 [name] ([id number]) -------------------------------------------------------------------------------- Patient/Caregiver Education Details Patient Name: [name] Date of Service: 06/16/2021 10:15 AM Medical Record Number: [id number] Patient Account Number: [id number] Date of Birth/Gender: 02-24-1946 (75 y.o. M) Treating RN: Carlene Coria Primary Care Physician: [name] Other Clinician: Referring Physician: [name] Treating Physician/Extender: Skipper Cliche in Treatment: 11 Education  Assessment Education Provided To: Patient Education Topics Provided Wound/Skin Impairment: Methods: Explain/Verbal Responses: State content correctly Electronic Signature(s) Signed: 06/17/2021 4:26:12 PM By: Carlene Coria RN Entered By: Carlene Coria on 06/16/2021 11:27:55 [name] ([id number]) -------------------------------------------------------------------------------- Wound Assessment Details Patient Name: [name] Date of Service: 06/16/2021 10:15 AM Medical Record Number: [id number] Patient Account Number: [id number] Date of Birth/Sex: 03/08/1946 (75 y.o. M) Treating RN: Carlene Coria Primary Care Mariaha Ellington: [name] Other Clinician: Referring Quina Wilbourne: [name] Treating Adaijah Endres/Extender: Jeri Cos [name]: 11 Wound Status Wound Number: 43 Primary Diabetic Wound/Ulcer of the Lower Extremity Etiology: Wound Location: Right Calcaneus Wound Open Wounding Event: Gradually Appeared Status: Date Acquired: 05/31/2021 Comorbid Anemia, Lymphedema, Chronic Obstructive Pulmonary Weeks Of Treatment: 2 History: Disease (COPD), Sleep Apnea, Congestive Heart Failure, Clustered Wound: No Coronary Artery Disease, Hypertension, Peripheral Venous Disease, Type II Diabetes, Osteoarthritis, Neuropathy, Received Chemotherapy Photos Wound Measurements Length: (cm) 1 Width: (cm) 1.1 Depth: (cm) 0.3 Area: (cm) 0.864 Volume: (cm) 0.259 % Reduction in Area: 42.1% % Reduction in Volume: 13.1% Epithelialization: Small (1-33%) Tunneling: No Undermining: No Wound Description Classification: Grade 1 Exudate Amount: Medium Exudate Type: Serosanguineous Exudate Color: red, brown Foul Odor After Cleansing: No Slough/Fibrino Yes Wound Bed Granulation Amount: Small (1-33%) Exposed Structure Granulation Quality: Pink Fat Layer (Subcutaneous Tissue) Exposed: Yes Necrotic Amount: Medium (34-66%) Necrotic Quality: Adherent Slough Treatment Notes Wound #43  (Calcaneus) Wound  Laterality: Right Cleanser Soap and Water Discharge Instruction: Gently cleanse wound with antibacterial soap, rinse and pat dry prior to dressing wounds Peri-Wound Care Topical CZAR, YSAGUIRRE. ([id number]) Triamcinolone Acetonide Cream, 0.1%, 15 (g) tube Discharge Instruction: Apply as directed by Marcello Tuzzolino. Primary Dressing Silvercel Small 2x2 (in/in) Discharge Instruction: Apply Silvercel Small 2x2 (in/in) as instructed Secondary Dressing ABD Pad 5x9 (in/in) Discharge Instruction: Cover with ABD pad Secured With Compression Wrap Medichoice 4 layer Compression System, 35-40 mmHG Discharge Instruction: Apply multi-layer wrap as directed. Compression Stockings Add-Ons Electronic Signature(s) Signed: 06/17/2021 4:26:12 PM By: Carlene Coria RN Entered By: Carlene Coria on 06/16/2021 10:49:31 [name] ([id number]) -------------------------------------------------------------------------------- Vitals Details Patient Name: [name] Date of Service: 06/16/2021 10:15 AM Medical Record Number: [id number] Patient Account Number: [id number] Date of Birth/Sex: July 10, 1945 (75 y.o. M) Treating RN: Carlene Coria Primary Care Laquashia Mergenthaler: [name] Other Clinician: Referring Shanquita Ronning: [name] Treating Owen Pagnotta/Extender: Skipper Cliche in Treatment: 11 Vital Signs Time Taken: 10:40 Temperature (F): 98.4 Height (in): 66 Pulse (bpm): 59 Weight (lbs): 300 Respiratory Rate (breaths/min): 18 Body Mass Index (BMI): 48.4 Blood Pressure (mmHg): 140/65 Reference Range: 80 - 120 mg / dl Electronic Signature(s) Signed: 06/17/2021 4:26:12 PM By: Carlene Coria RN Entered By: Carlene Coria on 06/16/2021 10:40:39

## 2021-06-20 ENCOUNTER — Ambulatory Visit: Payer: Medicare PPO | Admitting: Nurse Practitioner

## 2021-06-20 DIAGNOSIS — Z0289 Encounter for other administrative examinations: Secondary | ICD-10-CM

## 2021-06-21 ENCOUNTER — Telehealth (INDEPENDENT_AMBULATORY_CARE_PROVIDER_SITE_OTHER): Payer: Medicare PPO | Admitting: Nurse Practitioner

## 2021-06-21 ENCOUNTER — Encounter: Payer: Self-pay | Admitting: Nurse Practitioner

## 2021-06-21 ENCOUNTER — Telehealth: Payer: Self-pay

## 2021-06-21 DIAGNOSIS — J209 Acute bronchitis, unspecified: Secondary | ICD-10-CM

## 2021-06-21 DIAGNOSIS — J449 Chronic obstructive pulmonary disease, unspecified: Secondary | ICD-10-CM | POA: Diagnosis not present

## 2021-06-21 DIAGNOSIS — R0602 Shortness of breath: Secondary | ICD-10-CM | POA: Diagnosis not present

## 2021-06-21 DIAGNOSIS — J440 Chronic obstructive pulmonary disease with acute lower respiratory infection: Secondary | ICD-10-CM | POA: Diagnosis not present

## 2021-06-21 MED ORDER — AMOXICILLIN-POT CLAVULANATE 875-125 MG PO TABS: 1.0000 | ORAL_TABLET | Freq: Two times a day (BID) | ORAL | 0 refills | Status: DC

## 2021-06-21 MED ORDER — PREDNISONE 10 MG PO TABS: ORAL_TABLET | ORAL | 0 refills | Status: DC

## 2021-06-21 NOTE — Progress Notes (Signed)
[facility] [place], [place] [address]  Internal MEDICINE  Telephone Visit  Patient Name: Adam Merritt  [id number]  [id number]  Date of Service: 06/21/2021  I connected with the patient at 9:20 AM by telephone and verified the patients identity using two identifiers.   I discussed the limitations, risks, security and privacy concerns of performing an evaluation and management service by telephone and the availability of in person appointments. I also discussed with the patient that there may be a patient responsible charge related to the service.  The patient expressed understanding and agrees to proceed.    Chief Complaint  Patient presents with   Telephone Assessment    [phone]   Telephone Screen   Cough   Wheezing    HPI Chace presents for a telehealth virtual visit for bronchitis. He has a cough and is wheezing. This happens a couple of times per year for him and he usually takes a prednisone taper and antibiotic to resolve the infection. He had a nebulizer machine at home but his wife lost it. He needs a new nebulizer machine. He just got an order for duoneb nebulizer medication from the ER doctor. He was treated for bronchitis in September with prednisone and augmentin which resolved the infection.    Current Medication: Outpatient Encounter Medications as of 06/21/2021  Medication Sig   albuterol (VENTOLIN HFA) 108 (90 Base) MCG/ACT inhaler Inhale 2 puffs into the lungs every 6 (six) hours as needed for wheezing or shortness of breath.   amoxicillin-clavulanate (AUGMENTIN) 875-125 MG tablet Take 1 tablet by mouth 2 (two) times daily.   aspirin 325 MG EC tablet Take 325 mg by mouth daily.   atorvastatin (LIPITOR) 40 MG tablet Take 40 mg by mouth at bedtime.    benzonatate (TESSALON) 100 MG capsule Take 1 capsule (100 mg total) by mouth 2 (two) times daily as needed for cough.   carvedilol (COREG) 25 MG tablet Take 25 mg by mouth 2 (two) times daily  with a meal.   Cholecalciferol (VITAMIN D3 PO) Take by mouth.   Continuous Blood Gluc Sensor (FREESTYLE LIBRE 2 SENSOR) MISC 1 Device by Does not apply route every 14 (fourteen) days.   docusate sodium (COLACE) 50 MG capsule Take 50 mg by mouth 2 (two) times daily.   febuxostat (ULORIC) 40 MG tablet Take 1 tablet (40 mg total) by mouth daily.   fluticasone (FLOVENT HFA) 110 MCG/ACT inhaler Take 2 puffs in am and pm  for copd // chronic bronchitis ( rinse mouth)   furosemide (LASIX) 40 MG tablet TAKE 1 TABLET(40 MG) BY MOUTH DAILY   gabapentin (NEURONTIN) 300 MG capsule TAKE 2 CAPSULES BY MOUTH EVERY MORNING, 1 CAPSULE EVERY EVENING AND 2 CAPSULES AT NIGHT   glucose blood test strip Use as instructed to check blood sugars twice daily   hydrALAZINE (APRESOLINE) 25 MG tablet Take 25 mg by mouth 3 (three) times daily.    Insulin Glargine (BASAGLAR KWIKPEN) 100 UNIT/ML INJECT 30 TO 36 UNITS UNDER THE SKIN EVERY DAY   Insulin Pen Needle (BD PEN NEEDLE NANO U/F) 32G X 4 MM MISC Use as directed with insulin DX e11.65   ipratropium-albuterol (DUONEB) 0.5-2.5 (3) MG/3ML SOLN Take 3 mLs by nebulization every 4 (four) hours as needed (for shortness of breath).   ketotifen (ZADITOR) 0.025 % ophthalmic solution INSTILL 1 DROP IN BOTH EYES TWICE A DAY   liraglutide (VICTOZA) 18 MG/3ML SOPN INJECT 1.8MG  INTO THE SKIN DAILY  mometasone (ELOCON) 0.1 % lotion Apply three times weekly as directed.   Multiple Vitamin (MULTIVITAMIN WITH MINERALS) TABS tablet Take 1 tablet by mouth daily.   ondansetron (ZOFRAN) 4 MG tablet Take 1 tablet (4 mg total) by mouth every 8 (eight) hours as needed.   OneTouch Delica Lancets 69S MISC Use twice daily diag e11.65   pentoxifylline (TRENTAL) 400 MG CR tablet TAKE 1 TABLET BY MOUTH THREE TIMES DAILY FOR CLAUDICATION   potassium chloride SA (KLOR-CON) 20 MEQ tablet Take 1 tablet (20 mEq total) by mouth 2 (two) times daily.   prazosin (MINIPRESS) 1 MG capsule TAKE THREE CAPSULES BY  MOUTH EVERY EVENING FOR NIGHTMARES   predniSONE (DELTASONE) 10 MG tablet Take one tab 3 x day for 3 days, then take one tab 2 x a day for 3 days and then take one tab a day for 3 days for copd   sennosides-docusate sodium (SENOKOT-S) 8.6-50 MG tablet Take 1-2 tablets by mouth 2 (two) times daily.   sertraline (ZOLOFT) 100 MG tablet Take 100 mg by mouth daily.    tiZANidine (ZANAFLEX) 2 MG tablet Take 1 tablet (2 mg total) by mouth 3 (three) times daily.   traZODone (DESYREL) 50 MG tablet Take 75 mg by mouth at bedtime.   triamcinolone cream (KENALOG) 0.1 % Apply 1 application topically 2 (two) times daily.   No facility-administered encounter medications on file as of 06/21/2021.    Surgical History: Past Surgical History:  Procedure Laterality Date   AMPUTATION TOE Right 06/23/2018   Procedure: AMPUTATION TOE;  Surgeon: Sharlotte Alamo, DPM;  Location: [facility] ORS;  Service: Podiatry;  Laterality: Right;   CHOLECYSTECTOMY     CORONARY ARTERY BYPASS GRAFT      Medical History: Past Medical History:  Diagnosis Date   Anemia    CAD (coronary artery disease)    Chest pain    Coronary artery disease    Diabetes mellitus without complication (HCC)    Diastolic CHF ([place])    Esophageal reflux    Herpes zoster without mention of complication    Hyperlipidemia    Hypertension    Insomnia    Lumbago    Lymphedema    Neuropathy in diabetes (HCC)    Obstructive chronic bronchitis without exacerbation (HCC)    Other malaise and fatigue    Stroke ([place])    Varicose veins     Family History: Family History  Problem Relation Age of Onset   Diabetes Mother    Hyperlipidemia Mother    Hypertension Mother    Diabetes Father    Hyperlipidemia Father    Hypertension Father    CAD Father     Social History   Socioeconomic History   Marital status: Married    Spouse name: Not on file   Number of children: Not on file   Years of education: Not on file   Highest education level: Not on  file  Occupational History   Occupation: retired  Tobacco Use   Smoking status: Never   Smokeless tobacco: Never  Vaping Use   Vaping Use: Never used  Substance and Sexual Activity   Alcohol use: No    Comment: quit alcohol 30 years ago   Drug use: No   Sexual activity: Not Currently  Other Topics Concern   Not on file  Social History Narrative   Lives at home with wife, uses a wheelchair now   Social Determinants of Health   Financial Resource Strain: Not on  file  Food Insecurity: Not on file  Transportation Needs: Not on file  Physical Activity: Not on file  Stress: Not on file  Social Connections: Not on file  Intimate Partner Violence: Not on file      Review of Systems  Constitutional:  Positive for chills, fatigue and fever.  HENT:  Positive for congestion and postnasal drip. Negative for rhinorrhea, sinus pressure, sinus pain, sneezing and sore throat.   Respiratory:  Positive for cough, chest tightness, shortness of breath and wheezing.   Cardiovascular: Negative.  Negative for chest pain and palpitations.  Gastrointestinal: Negative.  Negative for abdominal pain, constipation, diarrhea, nausea and vomiting.  Musculoskeletal: Negative.  Negative for myalgias.  Skin: Negative.  Negative for rash.  Neurological:  Negative for headaches.   Vital Signs: There were no vitals taken for this visit.   Observation/Objective: He is alert and oriented and engages in conversation appropriately. He does not sound as though he is in any acute distress over telephone call.     Assessment/Plan: 1. Acute bronchitis with COPD ([place]) Empiric antibiotic treatment prescribed and a prednisone taper - predniSONE (DELTASONE) 10 MG tablet; Take one tab 3 x day for 3 days, then take one tab 2 x a day for 3 days and then take one tab a day for 3 days for copd  Dispense: 18 tablet; Refill: 0 - amoxicillin-clavulanate (AUGMENTIN) 875-125 MG tablet; Take 1 tablet by mouth 2 (two) times  daily.  Dispense: 20 tablet; Refill: 0  2. Chronic bronchitis with emphysema (HCC) New nebulizer machine provided for patient, wife will pick up. [facility] notified Claiborne Billings) - For home use only DME Nebulizer machine  3. SOB (shortness of breath) Use nebulizer machine with duoneb medication every 4-6 hours as needed. Prednisone taper prescribed. May also use albuterol inhaler (rescue inhaler) as needed as prescribed.  - For home use only DME Nebulizer machine - predniSONE (DELTASONE) 10 MG tablet; Take one tab 3 x day for 3 days, then take one tab 2 x a day for 3 days and then take one tab a day for 3 days for copd  Dispense: 18 tablet; Refill: 0    General Counseling: Verner verbalizes understanding of the findings of today's phone visit and agrees with plan of treatment. I have discussed any further diagnostic evaluation that may be needed or ordered today. We also reviewed his medications today. he has been encouraged to call the office with any questions or concerns that should arise related to todays visit.  Return if symptoms worsen or fail to improve.   Orders Placed This Encounter  Procedures   For home use only DME Nebulizer machine    Meds ordered this encounter  Medications   predniSONE (DELTASONE) 10 MG tablet    Sig: Take one tab 3 x day for 3 days, then take one tab 2 x a day for 3 days and then take one tab a day for 3 days for copd    Dispense:  18 tablet    Refill:  0   amoxicillin-clavulanate (AUGMENTIN) 875-125 MG tablet    Sig: Take 1 tablet by mouth 2 (two) times daily.    Dispense:  20 tablet    Refill:  0    Time spent:10 Minutes Time spent with patient included reviewing progress notes, labs, imaging studies, and discussing plan for follow up.  [place] Controlled Substance Database was reviewed by me for overdose risk score (ORS) if appropriate.  This patient was seen by Yetta Flock  Latesia Norrington, FNP-C in collaboration with Dr. [name] as a part of collaborative care  agreement.  Kristof Nadeem R. Valetta Fuller, MSN, FNP-C Internal medicine

## 2021-06-21 NOTE — Telephone Encounter (Signed)
Send [facility] message that we put nebulizer order in epic and gave pt nebulizer

## 2021-06-22 ENCOUNTER — Other Ambulatory Visit: Payer: Self-pay

## 2021-06-22 DIAGNOSIS — E1165 Type 2 diabetes mellitus with hyperglycemia: Secondary | ICD-10-CM

## 2021-06-22 MED ORDER — VICTOZA 18 MG/3ML ~~LOC~~ SOPN: PEN_INJECTOR | SUBCUTANEOUS | 3 refills | Status: DC

## 2021-06-23 ENCOUNTER — Other Ambulatory Visit: Payer: Self-pay

## 2021-06-23 DIAGNOSIS — I890 Lymphedema, not elsewhere classified: Secondary | ICD-10-CM | POA: Diagnosis not present

## 2021-06-23 DIAGNOSIS — L89612 Pressure ulcer of right heel, stage 2: Secondary | ICD-10-CM | POA: Diagnosis not present

## 2021-06-23 DIAGNOSIS — E1151 Type 2 diabetes mellitus with diabetic peripheral angiopathy without gangrene: Secondary | ICD-10-CM | POA: Diagnosis not present

## 2021-06-23 DIAGNOSIS — M1990 Unspecified osteoarthritis, unspecified site: Secondary | ICD-10-CM | POA: Diagnosis not present

## 2021-06-23 DIAGNOSIS — E11621 Type 2 diabetes mellitus with foot ulcer: Secondary | ICD-10-CM | POA: Diagnosis not present

## 2021-06-23 DIAGNOSIS — I872 Venous insufficiency (chronic) (peripheral): Secondary | ICD-10-CM | POA: Diagnosis not present

## 2021-06-23 DIAGNOSIS — E1140 Type 2 diabetes mellitus with diabetic neuropathy, unspecified: Secondary | ICD-10-CM | POA: Diagnosis not present

## 2021-06-23 DIAGNOSIS — L97822 Non-pressure chronic ulcer of other part of left lower leg with fat layer exposed: Secondary | ICD-10-CM | POA: Diagnosis not present

## 2021-06-23 DIAGNOSIS — L97812 Non-pressure chronic ulcer of other part of right lower leg with fat layer exposed: Secondary | ICD-10-CM | POA: Diagnosis not present

## 2021-06-23 NOTE — Progress Notes (Signed)
Adam Merritt, Adam Merritt ([id number]) Visit Report for 06/23/2021 Physician Orders Details Patient Name: Adam Merritt, Adam Merritt Date of Service: 06/23/2021 12:30 PM Medical Record Number: [id number] Patient Account Number: [id number] Date of Birth/Sex: 04/02/1946 (75 y.o. M) Treating RN: Carlene Coria Primary Care Provider: [name] Other Clinician: Referring Provider: [name] Treating Provider/Extender: Skipper Cliche in Treatment: 12 Verbal / Phone Orders: No Diagnosis Coding Follow-up Appointments o Return Appointment in 1 week. o Nurse Visit as needed Bathing/ Shower/ Hygiene o May shower with wound dressing protected with water repellent cover or cast protector. o No tub bath. Anesthetic (Use 'Patient Medications' Section for Anesthetic Order Entry) o Lidocaine applied to wound bed Edema Control - Lymphedema / Segmental Compressive Device / Other o Optional: One layer of unna paste to top of compression wrap (to act as an anchor). o 4 Layer Compression System Lymphedema. - left lower leg, apply TCA and lotion to lower leg o Elevate, Exercise Daily and Avoid Standing for Long Periods of Time. o Elevate legs to the level of the heart and pump ankles as often as possible o Elevate leg(s) parallel to the floor when sitting. Wound Treatment Wound #43 - Calcaneus Wound Laterality: Right Cleanser: Soap and Water 1 x Per Week/30 Days Discharge Instructions: Gently cleanse wound with antibacterial soap, rinse and pat dry prior to dressing wounds Topical: Triamcinolone Acetonide Cream, 0.1%, 15 (g) tube 1 x Per Week/30 Days Discharge Instructions: Apply as directed by provider. Primary Dressing: Silvercel Small 2x2 (in/in) 1 x Per Week/30 Days Discharge Instructions: Apply Silvercel Small 2x2 (in/in) as instructed Secondary Dressing: ABD Pad 5x9 (in/in) 1 x Per Week/30 Days Discharge Instructions: Cover with ABD pad Compression Wrap: Medichoice 4 layer Compression System,  35-40 mmHG 1 x Per Week/30 Days Discharge Instructions: Apply multi-layer wrap as directed. Electronic Signature(s) Signed: 06/23/2021 1:15:22 PM By: Carlene Coria RN Signed: 06/23/2021 2:08:42 PM By: Worthy Keeler PA-C Entered By: Carlene Coria on 06/23/2021 13:15:22 Adam Merritt ([id number]) -------------------------------------------------------------------------------- SuperBill Details Patient Name: Adam Merritt Date of Service: 06/23/2021 Medical Record Number: [id number] Patient Account Number: [id number] Date of Birth/Sex: 12-26-1945 (75 y.o. M) Treating RN: Carlene Coria Primary Care Provider: [name] Other Clinician: Referring Provider: [name] Treating Provider/Extender: Skipper Cliche in Treatment: 12 Diagnosis Coding ICD-10 Codes Code Description I89.0 Lymphedema, not elsewhere classified I87.2 Venous insufficiency (chronic) (peripheral) L97.812 Non-pressure chronic ulcer of other part of right lower leg with fat layer exposed L97.822 Non-pressure chronic ulcer of other part of left lower leg with fat layer exposed L89.612 Pressure ulcer of right heel, stage 2 Facility Procedures CPT4: Description Modifier Quantity Code 85027741 [id number] BILATERAL: Application of multi-layer venous compression system; leg (below knee), including 1 ankle and foot. Electronic Signature(s) Signed: 06/23/2021 1:16:39 PM By: Carlene Coria RN Signed: 06/23/2021 2:08:42 PM By: Worthy Keeler PA-C Entered By: Carlene Coria on 06/23/2021 13:16:39

## 2021-06-23 NOTE — Progress Notes (Signed)
Adam Merritt, Adam Merritt ([id number]) Visit Report for 06/23/2021 Arrival Information Details Patient Name: Adam Merritt, Adam Merritt Date of Service: 06/23/2021 12:30 PM Medical Record Number: [id number] Patient Account Number: [id number] Date of Birth/Sex: 11/22/1945 (75 y.o. M) Treating RN: Carlene Coria Primary Care Aldora Perman: [name] Other Clinician: Referring Ayrton Mcvay: [name] Treating Rusti Arizmendi/Extender: Skipper Cliche in Treatment: 12 Visit Information History Since Last Visit All ordered tests and consults were completed: No Patient Arrived: Wheel Chair Added or deleted any medications: No Arrival Time: 12:30 Any new allergies or adverse reactions: No Accompanied By: wife Had a fall or experienced change in No Transfer Assistance: None activities of daily living that may affect Patient Identification Verified: Yes risk of falls: Secondary Verification Process Completed: Yes Signs or symptoms of abuse/neglect since last visito No Patient Requires Transmission-Based Precautions: No Hospitalized since last visit: No Patient Has Alerts: Yes Implantable device outside of the clinic excluding No Patient Alerts: 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 M) Treating RN: Carlene Coria Primary Care Earle Troiano: [name] Other Clinician: Referring Azir Muzyka: [name] Treating Kalev Temme/Extender: Skipper Cliche in Treatment: 12 Clinic Level of Care Assessment Items TOOL 1 Quantity Score []  - Use when EandM and Procedure is performed on INITIAL visit 0 ASSESSMENTS - Nursing Assessment / Reassessment []  - General Physical Exam (combine w/ comprehensive assessment (listed just below) when performed on new 0 pt. evals) []  - 0 Comprehensive Assessment (HX, ROS, Risk Assessments, Wounds Hx, etc.) ASSESSMENTS - Wound and Skin Assessment / Reassessment []  - Dermatologic / Skin Assessment (not related to wound area) 0 ASSESSMENTS - Ostomy and/or Continence Assessment and Care []  - Incontinence Assessment and Management 0 []  - 0 Ostomy Care Assessment and Management (repouching, etc.) PROCESS - Coordination of Care []  - Simple Patient / Family Education for ongoing care 0 []  - 0 Complex (extensive) Patient / Family Education for ongoing care []  - 0 Staff obtains Programmer, systems, Records, Test Results / Process Orders []  - 0 Staff telephones HHA, Nursing Homes / Clarify orders / etc []  - 0 Routine Transfer to another Facility (non-emergent condition) []  - 0 Routine Hospital Admission (non-emergent condition) []  - 0 New Admissions / Biomedical engineer / Ordering NPWT, Apligraf, etc. []  - 0 Emergency Hospital Admission (emergent condition) PROCESS - Special Needs []  - Pediatric / Minor Patient Management 0 []  - 0 Isolation Patient Management []  - 0 Hearing / Language / Visual special needs []  - 0 Assessment of Community assistance (transportation, D/C planning, etc.) []  - 0 Additional assistance / Altered mentation []  - 0 Support Surface(s) Assessment (bed, cushion, seat, etc.) INTERVENTIONS - Miscellaneous []  - External ear exam 0 []  - 0 Patient Transfer (multiple staff / Civil Service fast streamer / Similar devices) []  - 0 Simple Staple / Suture removal (25 or less) []  - 0 Complex Staple / Suture removal (26 or more) []  - 0 Hypo/Hyperglycemic Management (do not check if billed  separately) []  - 0 Ankle / Brachial Index (ABI) - do not check if billed separately Has the patient been seen  at the hospital within the last three years: Yes Total Score: 0 Level Of Care: ____ Adam Merritt ([id number]) Electronic Signature(s) Unsigned Entered By: Carlene Coria on 06/23/2021 13:16:08 Signature(s): Date(s): Adam Merritt ([id number]) -------------------------------------------------------------------------------- Compression Therapy Details Patient Name: Adam Merritt Date of Service: 06/23/2021 12:30 PM Medical Record Number: [id number] Patient Account Number: [id number] Date of Birth/Sex: 1945-09-05 (75 y.o. M) Treating RN: Carlene Coria Primary Care Akeel Reffner: [name] Other Clinician: Referring Nyair Depaulo: [name] Treating Briannah Lona/Extender: Skipper Cliche in Treatment: 12 Compression Therapy Performed for Wound Assessment: Wound #43 Right Calcaneus Performed By: Clinician Carlene Coria, RN Compression Type: Four Layer Electronic Signature(s) Signed: 06/23/2021 1:13:31 PM By: Carlene Coria RN Entered By: Carlene Coria on 06/23/2021 13:13:31 Adam Merritt ([id number]) -------------------------------------------------------------------------------- Compression Therapy Details Patient Name: Adam Merritt Date of Service: 06/23/2021 12:30 PM Medical Record Number: [id number] Patient Account Number: [id number] Date of Birth/Sex: 04-12-1946 (75 y.o. M) Treating RN: Carlene Coria Primary Care Zierra Laroque: [name] Other Clinician: Referring Alonza Knisley: [name] Treating Samaj Wessells/Extender: Skipper Cliche in Treatment: 12 Compression Therapy Performed for Wound Assessment: NonWound Condition Lymphedema - Left Leg Performed By: Clinician Carlene Coria, RN Compression Type: Four Layer Electronic Signature(s) Signed: 06/23/2021 1:14:24 PM By: Carlene Coria RN Entered By: Carlene Coria on 06/23/2021 13:14:24 Adam Merritt  ([id number]) -------------------------------------------------------------------------------- Encounter Discharge Information Details Patient Name: Adam Merritt Date of Service: 06/23/2021 12:30 PM Medical Record Number: [id number] Patient Account Number: [id number] Date of Birth/Sex: August 13, 1945 (75 y.o. M) Treating RN: Carlene Coria Primary Care Brinden Kincheloe: [name] Other Clinician: Referring Jeshawn Melucci: [name] Treating Ejay Lashley/Extender: Skipper Cliche in Treatment: 12 Encounter Discharge Information Items Discharge Condition: Stable Ambulatory Status: Wheelchair Discharge Destination: Home Transportation: Private Auto Accompanied By: wife Schedule Follow-up Appointment: Yes Clinical Summary of Care: Patient Declined Electronic Signature(s) Signed: 06/23/2021 1:15:59 PM By: Carlene Coria RN Entered By: Carlene Coria on 06/23/2021 13:15:59 Adam Merritt ([id number]) -------------------------------------------------------------------------------- Wound Assessment Details Patient Name: Adam Merritt Date of Service: 06/23/2021 12:30 PM Medical Record Number: [id number] Patient Account Number: [id number] Date of Birth/Sex: 17-Aug-1945 (75 y.o. M) Treating RN: Carlene Coria Primary Care Montrey Buist: [name] Other Clinician: Referring Lasean Rahming: [name] Treating Cathlene Gardella/Extender: Jeri Cos [name]: 12 Wound Status Wound Number: 43 Primary Diabetic Wound/Ulcer of the Lower Extremity Etiology: Wound Location: Right Calcaneus Wound Open Wounding Event: Gradually Appeared Status: Date Acquired: 05/31/2021 Comorbid Anemia, Lymphedema, Chronic Obstructive Pulmonary Weeks Of Treatment: 3 History: Disease (COPD), Sleep Apnea, Congestive Heart Failure, Clustered Wound: No Coronary Artery Disease, Hypertension, Peripheral Venous Disease, Type II Diabetes, Osteoarthritis, Neuropathy, Received Chemotherapy Wound Measurements Length: (cm) 1 Width: (cm) 1 Depth:  (cm) 0.3 Area: (cm) 0.785 Volume: (cm) 0.236 % Reduction in Area: 47.4% % Reduction in Volume: 20.8% Epithelialization: Small (1-33%) Tunneling: No Undermining: No Wound Description Classification: Grade 1 Exudate Amount: Medium Exudate Type: Serosanguineous Exudate Color: red, brown Foul Odor After Cleansing: No Slough/Fibrino Yes Wound Bed Granulation Amount: Small (1-33%) Exposed Structure Granulation Quality: Pink Fat Layer (Subcutaneous Tissue) Exposed: Yes Necrotic Amount: Medium (34-66%) Necrotic Quality: Adherent Slough Treatment Notes Wound #43 (Calcaneus) Wound Laterality: Right Cleanser Soap and Water Discharge Instruction: Gently cleanse wound with antibacterial soap, rinse and pat dry prior to dressing wounds Peri-Wound Care Topical Triamcinolone Acetonide Cream, 0.1%, 15 (g) tube Discharge Instruction: Apply as directed by Deneen Slager. Primary Dressing Silvercel Small 2x2 (in/in) Discharge Instruction: Apply Silvercel Small 2x2 (in/in) as instructed Secondary Dressing ABD Pad 5x9 (in/in) Discharge Instruction: Cover with ABD pad  Secured With Compression Wrap Medichoice 4 layer Compression System, 35-40 mmHG Adam Merritt, Adam Merritt. ([id number]) Discharge Instruction: Apply multi-layer wrap as directed. Compression Stockings Add-Ons Electronic Signature(s) Signed: 06/23/2021 1:13:12 PM By: Carlene Coria RN Entered By: Carlene Coria on 06/23/2021 13:13:11

## 2021-06-25 ENCOUNTER — Encounter: Payer: Self-pay | Admitting: Nurse Practitioner

## 2021-06-29 ENCOUNTER — Ambulatory Visit: Payer: Medicare PPO | Admitting: Internal Medicine

## 2021-06-30 ENCOUNTER — Encounter: Payer: Medicare PPO | Admitting: Physician Assistant

## 2021-06-30 ENCOUNTER — Other Ambulatory Visit: Payer: Self-pay

## 2021-06-30 DIAGNOSIS — E11621 Type 2 diabetes mellitus with foot ulcer: Secondary | ICD-10-CM | POA: Diagnosis not present

## 2021-06-30 DIAGNOSIS — L97822 Non-pressure chronic ulcer of other part of left lower leg with fat layer exposed: Secondary | ICD-10-CM | POA: Diagnosis not present

## 2021-06-30 DIAGNOSIS — E1140 Type 2 diabetes mellitus with diabetic neuropathy, unspecified: Secondary | ICD-10-CM | POA: Diagnosis not present

## 2021-06-30 DIAGNOSIS — L97812 Non-pressure chronic ulcer of other part of right lower leg with fat layer exposed: Secondary | ICD-10-CM | POA: Diagnosis not present

## 2021-06-30 DIAGNOSIS — M1990 Unspecified osteoarthritis, unspecified site: Secondary | ICD-10-CM | POA: Diagnosis not present

## 2021-06-30 DIAGNOSIS — L89612 Pressure ulcer of right heel, stage 2: Secondary | ICD-10-CM | POA: Diagnosis not present

## 2021-06-30 DIAGNOSIS — I872 Venous insufficiency (chronic) (peripheral): Secondary | ICD-10-CM | POA: Diagnosis not present

## 2021-06-30 DIAGNOSIS — L97412 Non-pressure chronic ulcer of right heel and midfoot with fat layer exposed: Secondary | ICD-10-CM | POA: Diagnosis not present

## 2021-06-30 DIAGNOSIS — E1151 Type 2 diabetes mellitus with diabetic peripheral angiopathy without gangrene: Secondary | ICD-10-CM | POA: Diagnosis not present

## 2021-06-30 DIAGNOSIS — I890 Lymphedema, not elsewhere classified: Secondary | ICD-10-CM | POA: Diagnosis not present

## 2021-06-30 NOTE — Progress Notes (Addendum)
DAJION, BICKFORD ([id number]) Visit Report for 06/30/2021 Chief Complaint Document Details Patient Name: Adam Merritt, Adam Merritt Date of Service: 06/30/2021 12:30 PM Medical Record Number: [id number] Patient Account Number: [id number] Date of Birth/Sex: 12-26-1945 (76 y.o. M) Treating RN: Carlene Coria Primary Care Provider: [name] Other Clinician: Referring Provider: [name] Treating Provider/Extender: Skipper Cliche in Treatment: 13 Information Obtained from: Patient Chief Complaint Bilateral LE edema and weeping ulcers Electronic Signature(s) Signed: 06/30/2021 12:59:58 PM By: Worthy Keeler PA-C Entered By: Worthy Keeler on 06/30/2021 12:59:57 Adam Merritt ([id number]) -------------------------------------------------------------------------------- HPI Details Patient Name: Adam Merritt Date of Service: 06/30/2021 12:30 PM Medical Record Number: [id number] Patient Account Number: [id number] Date of Birth/Sex: 11/23/1945 (76 y.o. M) Treating RN: Carlene Coria Primary Care Provider: [name] Other Clinician: Referring Provider: [name] Treating Provider/Extender: Skipper Cliche in Treatment: 13 History of Present Illness HPI Description: 10/18/17-He is here for initial evaluation of bilateral lower extremity ulcerations in the presence of venous insufficiency and lymphedema. He has been seen by vascular medicine in the past, Dr. Lucky Cowboy, last seen in 2016. He does have a history of abnormal ABIs, which is to be expected given his lymphedema and venous insufficiency. According to Epic, it appears that all attempts for arterial evaluation and/or angiography were not follow through with by patient. He does have a history of being seen in lymphedema clinic in 2018, stopped going approximately 6 months ago stating "it didn't do any good". He does not have lymphedema pumps, he does not have custom fit compression wrap/stockings. He is diabetic and his recent A1c last month was  7.6. He admits to chronic bilateral lower extremity pain, no change in pain since blister and ulceration development. He is currently being treated with Levaquin for bronchitis. He has home health and we will continue. 10/25/17-He is here in follow-up evaluation for bilateral lower extremity ulcerationssubtle he remains on Levaquin for bronchitis. Right lower extremity with no evidence of drainage or ulceration, persistent left lower extremity ulceration. He states that home health has not been out since his appointment. He went to [facility] on Tuesday, studies revealed: RIGHT ABI 0.9, TBI 0.6 LEFT ABI 1.1, TBI 0.6 with triphasic flow bilaterally. We will continue his same treatment plan. He has been educated on compression therapy and need for elevation. He will benefit from lymphedema pumps 11/01/17-He is here in follow-up evaluation for left lower extremity ulcer. The right lower extremity remains healed. He has home health services, but they have not been out to see the patient for 2-3 weeks. He states it home health physical therapy changed his dressing yesterday after therapy; he placed Ace wrap compression. We are still waiting for lymphedema pumps, reordered d/t need for company change. 11/08/17-He is here in follow-up evaluation for left lower extremity ulcer. It is improved. Edema is significantly improved with compression therapy. We will continue with same treatment plan and he will follow-up next week. No word regarding lymphedema pumps 11/15/17-He is here in follow-up evaluation for left lower extremity ulcer. He is healed and will be discharged from wound care services. I have reached out to [facility] regarding his lymphedema pumps. They have been unable to reach the patient; the contact number they had with the patient's wife's cell phone and she has not answered any unrecognized calls. Contact should be made today, trial planned for next week; [facility]  will continue to follow 11/27/17 on evaluation today patient has multiple blistered areas over the right  lower extremity his left lower extremity appears to be doing okay. These blistered areas show signs of no infection which is great news. With that being said he did have some necrotic skin overlying which was mechanically debrided away with saline and gauze today without complication. Overall post debridement the wounds appear to be doing better but in general his swelling seems to be increased. This is obviously not good news. I think this is what has given rise to the blisters. 12/04/17 on evaluation today patient presents for follow-up concerning his bilateral lower extremity edema in the right lower extremity ulcers. He has been tolerating the dressing changes without complication. With that being said he has had no real issues with the wraps which is also good news. Overall I'm pleased with the progress he's been making. 12/11/17 on evaluation today patient appears to be doing rather well in regard to his right lateral lower extremity ulcer. He's been tolerating the dressing changes without complication. Fortunately there does not appear to be any evidence of infection at this time. Overall I'm pleased with the progress that is being made. Unfortunately he has been in the hospital due to having what sounds to be a stomach virus/flu fortunately that is starting to get better. 12/18/17 on evaluation today patient actually appears to be doing very well in regard to his bilateral lower extremities the swelling is under fairly good control his lymphedema pumps are still not up and running quite yet. With that being said he does have several areas of opening noted as far as wounds are concerned mainly over the left lower extremity. With that being said I do believe once he gets lymphedema pumps this would least help you mention some the fluid and preventing this from occurring. Hopefully that will be set  up soon sleeves are Artie in place at his home he just waiting for the machine. 12/25/17 on evaluation today patient actually appears to be doing excellent in fact all of his ulcers appear to have resolved his legs appear very well. I do think he needs compression stockings we have discussed this and they are actually going to go to [place] today to elastic therapy to get this fitted for him. I think that is definitely a good thing to do. Readmission: 04/09/18 upon evaluation today this patient is seen for readmission due to bilateral lower extremity lymphedema. He has significant swelling of his extremities especially on the left although the right is also swollen he has weeping from both sides. There are no obvious open wounds at this point. Fortunately he has been doing fairly well for quite a bit of time since I last saw him. Nonetheless unfortunately this seems to have reopened and is giving quite a bit of trouble. He states this began about a week ago when he first called [place] to get in to be seen. No fevers, chills, nausea, or vomiting noted at this time. He has not been using his lymphedema pumps due to the fact that they won't fit on his leg at this point likewise is also not been using his compression for essentially the same reason. 04/16/18 upon evaluation today patient actually appears to be doing a little better in regard to the fluid in his bilateral lower extremities. With that being said he's had three falls since I saw him last week. He also states that he's been feeling very poorly. I was concerned last week and feel like that the concern is still there as far as the congestion  in his chest is concerned he seems to be breathing about the same as last week but again he states he's very weak he's not even able to walk further than from the chair to the door. His wife had to buy a wheelchair just to be able to get them out of the house to get to the appointment today. This has me very  concerned. 04/23/18 on evaluation today patient actually appears to be doing much better than last week's evaluation. At that time actually had to transport him to the ER via EMS and he subsequently was admitted for acute pulmonary edema, acute renal failure, and acute congestive heart failure. Fortunately he is doing much better. Apparently they did dialyze him and were able to take off roughly 35 pounds of fluid. Nonetheless he is feeling much better both in regard to his breathing and he's able to get around much better at this time compared to previous. Overall I'm very Adam Merritt, Adam Merritt ([id number]) happy with how things are at this time. There does not appear to be any evidence of infection currently. No fevers, chills, nausea, or vomiting noted at this time. 04/30/2018 patient seen today for follow-up and management of bilateral lower extremity lymphedema. He did express being more sad today than usual due to the recent loss of his dog. He states that he has been compliant with using the lymphedema pumps. However he does admit a minute over the last 2-3 days he has not been using the pumps due to the recent loss of his dog. At this time there is no drainage or open wounds to his lower extremities. The left leg edema is measuring smaller today. Still has a significant amount of edema on bilateral lower extremities With dry flaky skin. He will be referred to the lymphedema clinic for further management. Will continue 3 layer compression wraps and follow-up in 1-2 weeks.Denies any pain, fever, chairs recently. No recent falls or injuries reported during this visit. 05/07/18 on evaluation today patient actually appears to be doing very well in regard to his lower extremities in general all things considered. With that being said he is having some pain in the legs just due to the amount of swelling. He does have an area where he had a blister on the left lateral lower extremity this is open at this  point other than that there's nothing else weeping at this time. 05/14/18 on evaluation today patient actually appears to be doing excellent all things considered in regard to his lower extremities. He still has a couple areas of weeping on each leg which has continued to be the issue for him. He does have an appointment with the lymphedema clinic although this isn't until February 2020. That was the earliest they had. In the meantime he has continued to tolerate the compression wraps without complication. 05/28/18 on evaluation today patient actually appears to be doing more poorly in regard to his left lower extremity where he has a wound open at this point. He also had a fall where he subsequently injured his right great toe which has led to an open wound at the site unfortunately. He has been tolerating the dressing changes without complication in general as far as the wraps are concerned that he has not been putting any dressing on the left 1st toe ulcer site. 06/11/18 on evaluation today patient appears to be doing much worse in regard to his bilateral lower extremity ulcers. He has been tolerating the dressing changes without complication  although his legs have not been wrapped more recently. Overall I am not very pleased with the way his legs appear. I do believe he needs to be back in compression wraps he still has not received his compression wraps from the [facility] hospital as of yet. 06/18/18 on evaluation today patient actually appears to be doing significantly better than last time I saw him. He has been tolerating the compression wraps without complication in the circumferential ulcers especially appear to be doing much better. His toe ulcer on the right in regard to the great toe is better although not as good as the legs in my pinion. No fevers chills noted 07/02/18 on evaluation today patient appears to be doing much better in regard to his lower extremity ulcers. Unfortunately since I last  saw him he's had the distal portion of his right great toe if he dated it sounds as if this actually went downhill very quickly. I had only seen him a few days prior and the toe did not appear to be infected at that point subsequently became infected very rapidly and it was decided by the surgeon that the distal portion of the toe needed to be removed. The patient seems to be doing well in this regard he tells me. With that being said his lower extremities are doing better from the standpoint of the wounds although he is significantly swollen at this point. 07/09/18 on evaluation today patient appears to be doing better in regard to the wounds on his lower extremities. In fact everything is almost completely healed he is just a small area on the left posterior lower extremity that is open at this point. 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 With that being said he needs some kind of compression it does sound like he may not be wearing his compression, that is the wraps, during the entire time between when he's here visit to visit. Apparently his wife took the current one off because it began to "fall apart". 07/16/18 on evaluation today patient appears to be extremely swollen especially in regard to his left lower extremity unfortunately. He also has a new skin tear over the left lower extremity and there's a smaller area on the right lower extremity as well. Unfortunately this seems to be due in part to blistering and fluid buildup in his leg. He did get the reduction wraps that were ordered by the [facility] hospital for him to go to lymphedema clinic. With that being said his wounds on the legs have not healed to the point to where they would likely accept them as a patient lymphedema clinic currently. We need to try to  get this to heal. With that being said he's been taking his wraps off which is not doing him any favors at this point. In fact this is probably quite counterproductive compared to what needs to occur. We will likely need to increase to a four layer compression wrap and continue to also utilize elevation and he has to keep the wraps on not take them off as he's been doing currently hasn't had a wrap on since Saturday. 07/23/18 on evaluation today patient appears to be doing much better in regard to his bilateral lower extremities. In fact his left lower extremity which was the largest is actually 15 cm smaller today compared  to what it was last time he was here in our clinic. This is obviously good news after just one week. Nonetheless the differences he actually kept the wraps on during the entire week this time. That's not typical for him. I do believe he understands a little bit better now the severity of the situation and why it's important for him to keep these wraps on. 07/30/18 on evaluation today patient actually appears to be doing rather well in regard to his lower extremities. His legs are much smaller than they have been in the past and he actually has only one very small rudder superficial region remaining that is not closed on the left lateral/posterior lower extremity even this is almost completely close. I do believe likely next week he will be healed without any complications. I do think we need to continue the wraps however this seems to be beneficial for him. I also think it may be a good time for [place] to go ahead and see about getting the appointment with the lymphedema clinic which is supposed to be made for him in order to keep this moving along and hopefully get them into compression wraps that will in the end help him to remain healed. 08/06/18 on evaluation today patient actually appears to be doing very well in regard as bilateral lower extremities. In fact his wounds appear to  be completely healed at this time. 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 He is in the process of getting appointment with the lymphedema clinic we have made this referral were just waiting to hear back on the schedule time. We need to follow up on that today as well. 08/13/18 on evaluation today patient actually appears to be doing very well in regard to his bilateral lower extremities there are no open wounds at this point. We are gonna go ahead and see about ordering the Velcro compression wraps for him had a discussion with them about [place] doing it versus the [place] they feel like they can definitely afford going ahead and get the wraps themselves and they would prefer to try to avoid having to go to the lymphedema clinic if it all possible which I completely understand. As long as he has good compression I'm okay either way. 3/17//20 on evaluation today patient actually appears to be doing well in regard to his bilateral lower extremity ulcers. He has been tolerating the dressing changes without complication specifically the compression wraps. Overall is had no issues my fingers finding that I see at this point is that he is having trouble with constipation. He tells me he has not been able to go to the bathroom for about six days. He's taken to over-the- counter oral laxatives unfortunately this is not helping. He has not contacted his doctor. Adam Merritt, Adam Merritt ([id number]) 08/27/18 on evaluation today patient appears to be doing fairly well in regard to his lower extremities at this point. There does not appear to be any new altars is swelling is very well controlled. We are still waiting for his Velcro compression wraps to arrive that should be sometime in the next week is Artie sent the check for these. 09/03/18 on evaluation today patient appears to be doing excellent in regard to his bilateral lower extremities which he shows no signs of  wound openings in regard to this point. He does have his Velcro compression wraps which did arrive in the mail since I saw him last week.  Overall he is doing excellent in my pinion. 09/13/18 on evaluation today patient appears to be doing very well currently in regard to the overall appearance of his bilateral lower extremities although he's a little bit more swollen than last time we saw him. At that point he been discharged without any open wounds. Nonetheless he has a small open wound on the posterior left lower extremity with some evidence of cellulitis noted as well. Fortunately I feel like he has made good progress overall with regard to his lower extremities from were things used to be. 09/20/18 on evaluation today patient actually appears to be doing much better. The erythematous lower extremity is improving wound itself which is still open appears to be doing much better as far as but appearance as well as pain is concerned overall very pleased in this regard. There's no signs of active infection at this time. 09/27/18 on evaluation today patient's wounds on the lower extremity actually appear to be doing fairly well at this time which is good news. There is no evidence of active infection currently and again is just as left lower extremity were there any wounds at this point anyway. I believe they may be completely healed but again I'm not 100% sure based on evaluation today. I think one more week of observation would likely be a good idea. 10/04/18 on evaluation today patient actually appears to be doing excellent in regard to the left lower extremity which actually appears to be completely healed as of today. Unfortunately he's been having issues with his right lower extremity have a new wound that has opened. Fortunately there's no evidence of active infection at this time which is good news. 10/10/18 upon evaluation today patient actually appears to be doing a little bit worse with the new  open area on his right posterior lower extremity. He's been tolerating the dressing changes without complication. Right now we been using his compression wraps although I think we may need to switch back to actually performing bilateral compression wraps are in the clinic. No fevers, chills, nausea, or vomiting noted at this time. 10/17/18 on evaluation today patient actually appears to be doing quite well in regard to his bilateral lower extremity ulcers. He has been tolerating the reps without complication although he would prefer not to rewrap his legs as of today. Fortunately there's no signs of active infection which is good news. No fevers, chills, nausea, or vomiting noted at this time. 10/24/18 on evaluation today patient appears to be doing very well in regard to his lower extremities. His right lower extremity is shown signs of healing and his left lower Trinity though not healed appears to be improving which is excellent news. Overall very pleased with how things seem to be progressing at this time. The patient is likewise happy to hear this. His Velcro compression wraps however have not been put on properly will gonna show his wife how to do that properly today. 10/31/18 on evaluation today patient appears to be doing more poorly in regard to his left lower extremity in particular although both lower extremities actually are showing some signs of being worse than my previous evaluation. Unfortunately I'm just not sure that his compression stockings even with the use of the compression/lymphedema pumps seem to be controlling this well. Upon further questioning he tells me that he also is not able to lie flat in the bed due to his congestive heart failure and difficulty breathing. For that reason he sleeps in his  recliner. To make matters worse his recliner also cannot even hold his legs up so instead of even being somewhat elevated they pretty much hang down to the floor. This is the way he  sleeps each night which is definitely counterproductive to everything else that were attempting to do from the standpoint of controlling his fluid. Nonetheless I think that he potentially could benefit from a hospital bed although this would be something that his primary care provider would likely have to order since anything that is order on our side has to be directly related to wound care and again the hospital bed is not necessarily a direct relation to although I think it does contribute to his overall wound status on his lower extremities. 11/07/18 on evaluation today patient appears to be doing better in regard to his bilateral lower extremity. He's been tolerating the dressing changes without complication. We did in the interim since I last saw him switch to just using extras orbiting new alginate and that seems to have done much better for him. I'm very pleased with the overall progress that is made. 11/14/18 on evaluation today patient appears to be redoing rather well in regard to his left lower extremity ulcers which are the only ones remaining at this point. Fortunately there's no signs of active infection at this time which is good news. Overall been very pleased with how things seem to be progressing currently. No fevers, chills, nausea, or vomiting noted at this time. 11/20/17 on evaluation today patient actually appears to be doing quite well in regard to his lower extremities on the left on the right he has several blisters that showed up although there's some question about whether or not he has had his broker compression wraps on like he was supposed to or not. Fortunately there's no signs of active infection at this time which is good news. Unfortunately though he is doing well from the standpoint of the left leg the right leg is not doing as well again this is when we did not wrap last week. 11/28/18 on evaluation today patient appears to be doing well in regard to his bilateral lower  extremities is left appears to be healed is right is not healed but is very close to being so. Overall very pleased with how things seem to be progressing. Patient is likewise happy that things are doing well. 12/09/18 on evaluation today patient actually appears to be completely healed which is excellent news. He actually seems to be doing well in regard to the swelling of the bilateral lower extremities which is also great news. Overall very pleased with how things seem to be progressing. Readmission: 03/18/2019 upon evaluation today patient presents for reevaluation concerning issues that he is having with his left lower extremity where he does have a wound noted upon inspection today. He also has a wound on the right second toe on the tip where it is apparently been rubbing in his shoe I did look at issues and you can actually see where this has been occurring as well. Fortunately there is no signs of infection with regard to the toe necessarily although it is very swollen compared to normal that does have me somewhat concerned about the possibility of further evaluation for infection/osteomyelitis. I would recommend an x-ray to start with. Otherwise he states that it is been 1-2 weeks that he has had the draining in regard to left lower extremity he does not know how this happened he has been wearing his  compression appropriately and I do see that as well today but nonetheless I do think that he needs to continue to be very cautious with regard to elevation as well. Adam Merritt, Adam Merritt ([id number]) 03/25/2019 on evaluation today patient appears to be doing much better in regard to his lower extremity at this time. That is on the left. He did culture positive for Pseudomonas but the good news is he seems to be doing much better the gentamicin we applied topically seems to have done a great job for him. There is no signs of active infection at this time systemically and locally he is doing much better.  No fevers, chills, nausea, vomiting, or diarrhea. In regard to his right toe this seems to be doing much better there is very little pressure noted at this point I did clean up some of the callus today around the edges of the wound as well as the surface of the wound but to be honest he is progressing quite nicely. 10/30; the area on the left anterior lower tibia area is healed over. His edema control is adequate even though his use of the compression pumps seems very intermittent. He has a juxta lite stocking on the right leg and a think there is 1 for the left leg at home. His wife is putting these on. He has an area on the plantar right second toe which is a hammertoe they are trying to offload this 04/08/2019 on evaluation today patient appears to be doing well in regard to his lower extremities which is good news. We are seeing him today for his toe ulcer which is giving him a little bit more trouble but again still seems to be doing better at this point which is good news. He is going require some sharp debridement in regard to the toe today however. 04/15/2019 on evaluation today patient actually appears to be doing quite well with regard to his toe ulcer. I am very pleased with how things are doing in that regard. With regard to his lower extremity edema in general he tells me he is not been using the lymphedema pumps regularly although he does not use them. I think that he needs to use them more regularly he does have a small blister on the left lower extremity this is not open at this point but obviously it something that can get worse if he does not keep the compression going and the pumps going as well. 04/22/2019 on evaluation today patient appears to be doing well with regard to his toe ulcer. He has been tolerating the dressing changes without complication. This is measuring slightly smaller this week as compared to last week. Fortunately there is no signs of active infection at this  time. 05/06/2019 on evaluation today patient actually appears to be doing excellent. In fact his toe appears to be completely healed which is great news. There is no signs of active infection at this time. Readmission: Patient presents today for follow-up after having been discharged at the beginning of December 2020. He states that in recent weeks things have reopened and has been having more trouble at this point. With that being said fortunately there is no signs of active infection at this time. No fever chills noted. Nonetheless he also does have an issue with one of his toes as well that seems to be somewhat this is on the right foot second toe. 07/25/2019 upon evaluation today patient's lower extremities appear to be doing excellent bilaterally. I really  feel like he is showing signs of great improvement and in fact I think he is close to healing which is great news. There is no signs of active infection at this time. No fevers, chills, nausea, vomiting, or diarrhea. 07/31/2019 upon evaluation today patient appears to be doing excellent and in fact I think may be completely healed in regard to his right lower extremity that were still to monitor this 1 more week before closing it out. The left lower extremity is measuring smaller although not completely closed seems to be doing excellent. 08/07/2019 upon evaluation today patient appears to be doing excellent in regard to his wounds. In fact the right lower extremity is completely healed there is no issues here. The left lower extremity has 1 very small area still remaining fortunately there is no signs of infection and overall I think he is very close to closure of the here as well. 08/14/19 upon evaluation today patient appears to be doing excellent in regard to his lower extremities. In fact he appears to be completely healed as of today. Fortunately there's no signs of active infection at this time. No fevers, chills, nausea, or vomiting noted  at this time. READMISSION 09/26/2019 This is a 76 year old man who is well-known to this clinic having just been discharged on 08/14/2019. He has known lymphedema and chronic venous insufficiency. He has Farrow wrap stockings and also external compression pumps. He does not use the external compression pumps however. He does however apparently fairly faithfully use the stockings. They have not been lotion in his legs. He has developed new wounds on the right anterior as well as the left medial left lateral and left posterior calf. He returns for our review of this Past medical history includes coronary artery disease, hypertension, congestive heart failure, COPD, venous insufficiency with lymphedema, type 2 diabetes. He apparently has compression pumps, Farrow wraps and at 1 point a hospital bed although I believe he sleeps in a recliner 10/03/2019 upon evaluation today patient appears to be doing better with regard to his wounds in general. He has been tolerating the dressing changes without complication. Fortunately there is no signs of active infection. No fevers, chills, nausea, vomiting, or diarrhea. I do believe the compression wraps are helping as they always have in the past. 10/10/2019 upon evaluation today patient appears to be doing excellent at this point. His right lower extremity is measuring much better and in fact is completely healed. His left lower extremity is also measuring better though not healed seems to be doing excellent at this point which is great news. Overall I am very pleased with how things appear currently. 10/27/2019 upon evaluation today patient appears to be doing quite well with regard to his wounds currently. He has been tolerating the dressing changes without complication. Fortunately there is no signs of active infection at this time. In fact he is almost completely healed on the left and has just a very small area open and on the right he is completely  healed. 11/04/2019 upon evaluation today patient appears to be doing quite well with regard to his wounds. In fact he appears to be quite possibly completely healed on the left lower extremity now as well the right lower extremity is still doing well. With that being said unfortunately though this may be healed I think it is a very fragile area and I cannot even confirm there is not a very small opening still remaining. I really think he may benefit from 1 additional  weeks wrapping before discontinuing. 11/10/2019 upon evaluation today patient appears to be doing well in regard to his original wounds in fact everything that we were treating last week is completely healed on the left. Unfortunately he has a new skin tear just above where the wrap slipped down due to a fall he sustained causing this injury. 11/17/2019 on evaluation today patient appears to be doing better with regard to his wound. He has been tolerating the dressing changes without Adam Merritt, Adam Merritt. ([id number]) complication. Fortunately there is no signs of active infection at this time. No fevers, chills, nausea, vomiting, or diarrhea. 11/24/2019 upon evaluation today patient appears to be doing well with regard to his wound. This is making good progress there does not appear to be signs of active infection but overall I do feel like he is headed in the correct direction. Overall he is tolerating the compression wrap as well. 12/02/2019 upon evaluation today patient appears to be doing well with regard to his leg ulcer. In fact this appears to be completely healed and this is the last of the wounds that he had but still the left anterior lower extremity. Fortunately there is no signs of active infection at this time. No fevers, chills, nausea, vomiting, or diarrhea. Readmission: 02/05/2020 on evaluation today patient appears to be doing well with regard to his wound all things considered. He actually tells me that a couple weeks ago he had  trouble with his wraps and therefore took him off for a couple of days in order to give his legs a break. It was during this time that he actually developed increased swelling and edema of his legs and blisters on both legs. That is when he called to make the appointment. Nonetheless since that time he began wearing his compression wraps which are Velcro in the meantime and has done extremely well with this. In fact his leg appears to be under good control as far as edema is concerned today it is nothing like what I am seeing when he has come in for evaluations in the past. They have been using some of the silver alginate at home which has been beneficial for him. Fortunately there is no signs of active infection at this time. No fevers, chills, nausea, vomiting, or diarrhea. 02/19/2020 on evaluation today patient unfortunately does not appear to be doing quite as well as I would like to see. He has no signs of active infection at this time but unfortunately he is not doing well in regard to the overall appearance of his left leg. He has a couple other areas that are weeping now and the leg is much more swollen. I think that he needs to actually have a compression wrap to try to manage this. 02/26/2020 on evaluation today patient appears to be doing well with regard to his left lower extremity ulcer. Fortunately the swelling is down I do believe the pressure wrap seems to be doing quite well. Fortunately there is no signs of active infection at this time. 03/05/2020 upon evaluation today patient appears to be doing excellent in regard to his legs at this point. Fortunately there is no signs of active infection which is great news. Overall he feels like he is completely healed which is great news. Readmission: 05/20/2020 upon evaluation today patient appears to be doing well with regard to his leg ulcer all things considered. He is actually coming back to see [place] after having had a reopening of the ulcers  on his  left leg in the past several weeks. He is out of dressing supplies and his wife is been try to take care of this as best she can. 06/10/2020 upon evaluation today patient unfortunately appears to be doing significantly worse today compared to when I last saw him. He and his wife both tell me that "there has been a lot going on". I did not actually go into detail about everything that has been happening but nonetheless he has not obviously been wearing his compression. He brings them with him today but they were not on. I am not even certain to be honest that he could get those on at this point. Nonetheless I do believe that he is going to require compression wrapping at some point shortly but right now we need to try to get what appears to be infection under control at this time. 06/28/2020 upon evaluation today patient appears to be doing well with regard to 06/28/2020 upon evaluation today patient appears to be doing well pretty well in regard to his left leg which is much better than previous. With that being said in regard to his right leg he does seem to be having some issues with a new wound after he sustained a fall. This fortunately is not too deep but does appear to be something we need to address. Neither deep which is good. 07/05/2020 on evaluation today patient appears to be doing well with regard to his wounds. In fact everything on the left leg appears to be pretty much healed on the right leg this is very close though not completely closed yet. Fortunately there is no evidence of active infection at this time. No fevers, chills, nausea, vomiting, or diarrhea. 07/13/2019 upon evaluation today patient appears to be making good progress which is great news. Overall I am extremely pleased with where things stand today. There does not appear to be any signs of active infection which is great news and overall his left leg appears healed still the right leg though not healed does seem to be  making good progress which is excellent news. He is having no pain. 07/19/2020 on evaluation today patient appears to be doing well with regard to his wound. He has been tolerating the dressing changes without complication. Fortunately there does not appear to be any signs of active infection at this time. No fevers, chills, nausea, vomiting, or diarrhea. Readmission: 08/20/2020 on evaluation today patient presents for reevaluation here in the clinic concerning issues that he has been having with his bilateral lower extremities. Unfortunately this is an ongoing reoccurring issue. He tells me that he is really not certain exactly what caused the issue although as I discussed this further with him it appears that he has been sitting for the most part and sleeping in his chair with his feet on the ground he is not really elevating at all in fact the chair does not even have a reclining function therefore it is basically just him sitting with his feet on the ground. I think this alone has probably led to his increased swelling he is also having more difficulty walking which means that he is pumping less blood flow through his legs anyway as far as the venous stasis is concerned. All in all I think this is leading to a downward spiral of him not being able to walk very well as his legs are so heavy, they begin to weep, and overall he just does not seem to be doing as well as  it was when I last saw him. 08/27/2020 upon evaluation today patient's legs actually are showing signs of improvement which is great news. There does not appear to be any evidence of infection which is also excellent news. I am extremely pleased with where things stand today. No fevers, chills, nausea, vomiting, or diarrhea. 09/03/20 upon evaluation today patient appears to be doing excellent in regard to his wounds currently. He has been tolerating the dressing changes without complication and overall I am extremely pleased with where  things stand today. No fevers, chills, nausea, vomiting, or diarrhea. Overall he is healed on the right leg although the left leg not being completely healed still is doing significantly better. 09/10/2020/8/22 upon evaluation today patient appears to be doing well with regard to his wounds. He has been tolerating the dressing changes without complication. Fortunately he appears to be completely healed. I am very pleased in that regard. As is the patient his right leg is also doing well he did get a new recliner which is helping him keep his feet off the ground which I think is helped he is down 2 cm on the left compared to last week so I think there is some definite improvement here. Adam Merritt, Adam Merritt ([id number]) Readmission 12/15/20: Mr. Norfleet Capers is a 76 year old male with a past medical history of lymphedema, insulin-dependent type 2 diabetes, COPD and congestive heart failure that presents to the clinic for increased drainage from his legs bilaterally, Left greater than right. Patient has lymphedema. He has been seen in our clinic on multiple occasions for this issue. He currently denies signs of infection. He has not been using his lymphedema pumps. 12/23/2020 upon evaluation today patient appears to be doing well with regard to his wounds. He has been tolerating the dressing changes he really does not have any open wounds just weeping areas of the legs bilaterally and fortunately seems to be doing better at this point. 12/28/2020 upon evaluation today patient's wound is actually showing signs of good improvement. There does not appear to be any evidence of infection which is great news and overall I am extremely pleased at this point. I think is very close to complete resolution. 01/04/2021 upon evaluation today patient actually appears to be completely healed which is great news. I do not see any signs of active infection currently which is also great news. In general I am extremely pleased with  where we stand and I do believe that the patient is tolerating the dressing changes without complication. I believe he is ready to go back into his Velcro compression and I did see that they brought them with him today. Readmission: Patient presents today for follow-up he was last seen August 2. He is having some issues with some mild drainage and leaking from the left leg. This is something that has reopened and again is a recurrent issue for him. Subsequently he does wear his Velcro compression wraps though I think we may want to look into something a little bit better for him I am thinking that we may want to try the juxta fit compression which I think would do much more effective compression treatment overall for him. 04/08/2021 upon evaluation today patient appears to be doing well with regard to his legs. I do feel like he is showing signs of improvement which is great news. No fevers, chills, nausea, vomiting, or diarrhea. 04/15/2021 upon evaluation today patient's wounds actually appear to be very close to complete resolution. He is legs are  actually doing well left leg just has a small area of leaking at this point. Overall I think that we are very close to complete resolution based on what I am seeing. Nonetheless he seems to not be feeling too well today I really do not know what exactly is going on here. 04/22/2021 upon evaluation patient appears to be completely healed based on what I see today. I do not see anything open both legs appear to be doing quite well. 05/20/2021 upon evaluation today patient appears to be doing poorly in regard to his legs. He has not been a full month since we last saw him and he has reopened unfortunately. He does not appear to be any signs of active infection at this time which is great news. No fevers, chills, nausea, vomiting, or diarrhea. 12/27; the patient comes in the clinic today with severe bilateral lymphedema very dry skin but paradoxically his  wounds are actually healed or should I say not open. He has compression pumps that he does not use. [Once per week] he does not have compression stockings. He comes in today with a small presumably pressure ulcer on the lateral plantar heel on the right. The patient states he has been complaining about pain in this area for about 2 months although no wound was recognized until our intake nurse was doing her exam 06/16/2021 upon evaluation today patient appears to be doing better in regard to his wounds. He is not completely healed yet especially in regard to the right heel which is still open. Nonetheless I do think that his left leg is almost completely healed the right leg appears to be doing decently well his right heel is what still mainly open currently. 06/30/2021 upon evaluation today patient appears to be doing well with regard to his wound. Has been tolerating the dressing changes without complication. Fortunately I think his legs are doing quite well the heel is also doing better. In general I am very pleased with where we stand today. Electronic Signature(s) Signed: 06/30/2021 1:26:55 PM By: Worthy Keeler PA-C Entered By: Worthy Keeler on 06/30/2021 13:26:55 Adam Merritt ([id number]) -------------------------------------------------------------------------------- Physical Exam Details Patient Name: Adam Merritt Date of Service: 06/30/2021 12:30 PM Medical Record Number: [id number] Patient Account Number: [id number] Date of Birth/Sex: 1946-06-04 (76 y.o. M) Treating RN: Carlene Coria Primary Care Provider: [name] Other Clinician: Referring Provider: [name] Treating Provider/Extender: Skipper Cliche in Treatment: 67 Constitutional Well-nourished and well-hydrated in no acute distress. Respiratory normal breathing without difficulty. Psychiatric this patient is able to make decisions and demonstrates good insight into disease process. Alert and Oriented x 3.  pleasant and cooperative. Notes Patient's wound bed showed signs of good granulation and epithelization at this point. Fortunately I do not see any evidence again of anything draining in regard to the leg. With regard to his foot ulcer this is showing signs of improvement and is much smaller its not completely closed as of yet. Electronic Signature(s) Signed: 06/30/2021 1:27:15 PM By: Worthy Keeler PA-C Entered By: Worthy Keeler on 06/30/2021 13:27:14 Adam Merritt ([id number]) -------------------------------------------------------------------------------- Physician Orders Details Patient Name: Adam Merritt Date of Service: 06/30/2021 12:30 PM Medical Record Number: [id number] Patient Account Number: [id number] Date of Birth/Sex: [birth year] (76 y.o. M) Treating RN: Carlene Coria Primary Care Provider: [name] Other Clinician: Referring Provider: [name] Treating Provider/Extender: Skipper Cliche in Treatment: 13 Verbal / Phone Orders: No Diagnosis Coding ICD-10 Coding Code Description I89.0 Lymphedema, not  elsewhere classified I87.2 Venous insufficiency (chronic) (peripheral) L97.812 Non-pressure chronic ulcer of other part of right lower leg with fat layer exposed L97.822 Non-pressure chronic ulcer of other part of left lower leg with fat layer exposed L89.612 Pressure ulcer of right heel, stage 2 Follow-up Appointments o Return Appointment in 1 week. o Nurse Visit as needed Bathing/ Shower/ Hygiene o May shower with wound dressing protected with water repellent cover or cast protector. o No tub bath. Anesthetic (Use 'Patient Medications' Section for Anesthetic Order Entry) o Lidocaine applied to wound bed Edema Control - Lymphedema / Segmental Compressive Device / Other o Patient to wear own compression stockings. Remove compression stockings every night before going to bed and put on every morning when getting up. - left and right o Elevate,  Exercise Daily and Avoid Standing for Long Periods of Time. o Elevate legs to the level of the heart and pump ankles as often as possible o Elevate leg(s) parallel to the floor when sitting. o Compression Pump: Use compression pump on left lower extremity for 60 minutes, twice daily. o Compression Pump: Use compression pump on right lower extremity for 60 minutes, twice daily. Wound Treatment Wound #43 - Calcaneus Wound Laterality: Right Cleanser: Soap and Water 1 x Per Week/30 Days Discharge Instructions: Gently cleanse wound with antibacterial soap, rinse and pat dry prior to dressing wounds Topical: Triamcinolone Acetonide Cream, 0.1%, 15 (g) tube 1 x Per Week/30 Days Discharge Instructions: Apply as directed by provider. Primary Dressing: Silvercel Small 2x2 (in/in) 1 x Per Week/30 Days Discharge Instructions: Apply Silvercel Small 2x2 (in/in) as instructed Secondary Dressing: ABD Pad 5x9 (in/in) 1 x Per Week/30 Days Discharge Instructions: Cover with ABD pad Secondary Dressing: Kerlix 4.5 x 4.1 (in/yd) 1 x Per Week/30 Days Discharge Instructions: Apply Kerlix 4.5 x 4.1 (in/yd) as instructed Secured With: 68M Medipore H Soft Cloth Surgical Tape, 2x2 (in/yd) 1 x Per Week/30 Days Electronic Signature(s) Signed: 06/30/2021 6:09:14 PM By: Worthy Keeler PA-C Signed: 07/01/2021 2:31:10 PM By: Carlene Coria RN Entered By: Carlene [place] on 06/30/2021 13:07:39 Adam Merritt ([id number]) Adam Merritt, Adam Merritt ([id number]) -------------------------------------------------------------------------------- Problem List Details Patient Name: Adam Merritt Date of Service: 06/30/2021 12:30 PM Medical Record Number: [id number] Patient Account Number: [id number] Date of Birth/Sex: 05-11-1946 (76 y.o. M) Treating RN: Carlene Coria Primary Care Provider: [name] Other Clinician: Referring Provider: [name] Treating Provider/Extender: Skipper Cliche in Treatment: 13 Active  Problems ICD-10 Encounter Code Description Active Date MDM Diagnosis I89.0 Lymphedema, not elsewhere classified 03/25/2021 No Yes I87.2 Venous insufficiency (chronic) (peripheral) 03/25/2021 No Yes L97.812 Non-pressure chronic ulcer of other part of right lower leg with fat layer 03/25/2021 No Yes exposed L97.822 Non-pressure chronic ulcer of other part of left lower leg with fat layer 03/25/2021 No Yes exposed L89.612 Pressure ulcer of right heel, stage 2 05/31/2021 No Yes Inactive Problems Resolved Problems Electronic Signature(s) Signed: 06/30/2021 12:59:52 PM By: Worthy Keeler PA-C Entered By: Worthy Keeler on 06/30/2021 12:59:52 Adam Merritt ([id number]) -------------------------------------------------------------------------------- Progress Note Details Patient Name: Adam Merritt Date of Service: 06/30/2021 12:30 PM Medical Record Number: [id number] Patient Account Number: [id number] Date of Birth/Sex: April 15, 1946 (76 y.o. M) Treating RN: Carlene Coria Primary Care Provider: [name] Other Clinician: Referring Provider: [name] Treating Provider/Extender: Skipper Cliche in Treatment: 13 Subjective Chief Complaint Information obtained from Patient Bilateral LE edema and weeping ulcers History of Present Illness (HPI) 10/18/17-He is here for initial evaluation of bilateral lower extremity ulcerations in  the presence of venous insufficiency and lymphedema. He has been seen by vascular medicine in the past, Dr. Lucky Cowboy, last seen in 2016. He does have a history of abnormal ABIs, which is to be expected given his lymphedema and venous insufficiency. According to Epic, it appears that all attempts for arterial evaluation and/or angiography were not follow through with by patient. He does have a history of being seen in lymphedema clinic in 2018, stopped going approximately 6 months ago stating "it didn't do any good". He does not have lymphedema pumps, he does not have  custom fit compression wrap/stockings. He is diabetic and his recent A1c last month was 7.6. He admits to chronic bilateral lower extremity pain, no change in pain since blister and ulceration development. He is currently being treated with Levaquin for bronchitis. He has home health and we will continue. 10/25/17-He is here in follow-up evaluation for bilateral lower extremity ulcerationssubtle he remains on Levaquin for bronchitis. Right lower extremity with no evidence of drainage or ulceration, persistent left lower extremity ulceration. He states that home health has not been out since his appointment. He went to [facility] on Tuesday, studies revealed: RIGHT ABI 0.9, TBI 0.6 LEFT ABI 1.1, TBI 0.6 with triphasic flow bilaterally. We will continue his same treatment plan. He has been educated on compression therapy and need for elevation. He will benefit from lymphedema pumps 11/01/17-He is here in follow-up evaluation for left lower extremity ulcer. The right lower extremity remains healed. He has home health services, but they have not been out to see the patient for 2-3 weeks. He states it home health physical therapy changed his dressing yesterday after therapy; he placed Ace wrap compression. We are still waiting for lymphedema pumps, reordered d/t need for company change. 11/08/17-He is here in follow-up evaluation for left lower extremity ulcer. It is improved. Edema is significantly improved with compression therapy. We will continue with same treatment plan and he will follow-up next week. No word regarding lymphedema pumps 11/15/17-He is here in follow-up evaluation for left lower extremity ulcer. He is healed and will be discharged from wound care services. I have reached out to [facility] regarding his lymphedema pumps. They have been unable to reach the patient; the contact number they had with the patient's wife's cell phone and she has not answered any unrecognized  calls. Contact should be made today, trial planned for next week; [facility] will continue to follow 11/27/17 on evaluation today patient has multiple blistered areas over the right lower extremity his left lower extremity appears to be doing okay. These blistered areas show signs of no infection which is great news. With that being said he did have some necrotic skin overlying which was mechanically debrided away with saline and gauze today without complication. Overall post debridement the wounds appear to be doing better but in general his swelling seems to be increased. This is obviously not good news. I think this is what has given rise to the blisters. 12/04/17 on evaluation today patient presents for follow-up concerning his bilateral lower extremity edema in the right lower extremity ulcers. He has been tolerating the dressing changes without complication. With that being said he has had no real issues with the wraps which is also good news. Overall I'm pleased with the progress he's been making. 12/11/17 on evaluation today patient appears to be doing rather well in regard to his right lateral lower extremity ulcer. He's been tolerating the dressing changes without complication.  Fortunately there does not appear to be any evidence of infection at this time. Overall I'm pleased with the progress that is being made. Unfortunately he has been in the hospital due to having what sounds to be a stomach virus/flu fortunately that is starting to get better. 12/18/17 on evaluation today patient actually appears to be doing very well in regard to his bilateral lower extremities the swelling is under fairly good control his lymphedema pumps are still not up and running quite yet. With that being said he does have several areas of opening noted as far as wounds are concerned mainly over the left lower extremity. With that being said I do believe once he gets lymphedema pumps this would least help you  mention some the fluid and preventing this from occurring. Hopefully that will be set up soon sleeves are Artie in place at his home he just waiting for the machine. 12/25/17 on evaluation today patient actually appears to be doing excellent in fact all of his ulcers appear to have resolved his legs appear very well. I do think he needs compression stockings we have discussed this and they are actually going to go to [place] today to elastic therapy to get this fitted for him. I think that is definitely a good thing to do. Readmission: 04/09/18 upon evaluation today this patient is seen for readmission due to bilateral lower extremity lymphedema. He has significant swelling of his extremities especially on the left although the right is also swollen he has weeping from both sides. There are no obvious open wounds at this point. Fortunately he has been doing fairly well for quite a bit of time since I last saw him. Nonetheless unfortunately this seems to have reopened and is giving quite a bit of trouble. He states this began about a week ago when he first called [place] to get in to be seen. No fevers, chills, nausea, or vomiting noted at this time. He has not been using his lymphedema pumps due to the fact that they won't fit on his leg at this point likewise is also not been using his compression for essentially the same reason. 04/16/18 upon evaluation today patient actually appears to be doing a little better in regard to the fluid in his bilateral lower extremities. With that being said he's had three falls since I saw him last week. He also states that he's been feeling very poorly. I was concerned last week and feel like that the concern is still there as far as the congestion in his chest is concerned he seems to be breathing about the same as last week but again he states he's very weak he's not even able to walk further than from the chair to the door. His wife had to buy a wheelchair just to be  able to get them out of the house to get to the appointment today. This has me very concerned. Adam Merritt, Adam Merritt ([id number]) 04/23/18 on evaluation today patient actually appears to be doing much better than last week's evaluation. At that time actually had to transport him to the ER via EMS and he subsequently was admitted for acute pulmonary edema, acute renal failure, and acute congestive heart failure. Fortunately he is doing much better. Apparently they did dialyze him and were able to take off roughly 35 pounds of fluid. Nonetheless he is feeling much better both in regard to his breathing and he's able to get around much better at this time compared to previous. Overall  I'm very happy with how things are at this time. There does not appear to be any evidence of infection currently. No fevers, chills, nausea, or vomiting noted at this time. 04/30/2018 patient seen today for follow-up and management of bilateral lower extremity lymphedema. He did express being more sad today than usual due to the recent loss of his dog. He states that he has been compliant with using the lymphedema pumps. However he does admit a minute over the last 2-3 days he has not been using the pumps due to the recent loss of his dog. At this time there is no drainage or open wounds to his lower extremities. The left leg edema is measuring smaller today. Still has a significant amount of edema on bilateral lower extremities With dry flaky skin. He will be referred to the lymphedema clinic for further management. Will continue 3 layer compression wraps and follow-up in 1-2 weeks.Denies any pain, fever, chairs recently. No recent falls or injuries reported during this visit. 05/07/18 on evaluation today patient actually appears to be doing very well in regard to his lower extremities in general all things considered. With that being said he is having some pain in the legs just due to the amount of swelling. He does have an area  where he had a blister on the left lateral lower extremity this is open at this point other than that there's nothing else weeping at this time. 05/14/18 on evaluation today patient actually appears to be doing excellent all things considered in regard to his lower extremities. He still has a couple areas of weeping on each leg which has continued to be the issue for him. He does have an appointment with the lymphedema clinic although this isn't until February 2020. That was the earliest they had. In the meantime he has continued to tolerate the compression wraps without complication. 05/28/18 on evaluation today patient actually appears to be doing more poorly in regard to his left lower extremity where he has a wound open at this point. He also had a fall where he subsequently injured his right great toe which has led to an open wound at the site unfortunately. He has been tolerating the dressing changes without complication in general as far as the wraps are concerned that he has not been putting any dressing on the left 1st toe ulcer site. 06/11/18 on evaluation today patient appears to be doing much worse in regard to his bilateral lower extremity ulcers. He has been tolerating the dressing changes without complication although his legs have not been wrapped more recently. Overall I am not very pleased with the way his legs appear. I do believe he needs to be back in compression wraps he still has not received his compression wraps from the [facility] as of yet. 06/18/18 on evaluation today patient actually appears to be doing significantly better than last time I saw him. He has been tolerating the compression wraps without complication in the circumferential ulcers especially appear to be doing much better. His toe ulcer on the right in regard to the great toe is better although not as good as the legs in my pinion. No fevers chills noted 07/02/18 on evaluation today patient appears to be doing  much better in regard to his lower extremity ulcers. Unfortunately since I last saw him he's had the distal portion of his right great toe if he dated it sounds as if this actually went downhill very quickly. I had only seen him  a few days prior and the toe did not appear to be infected at that point subsequently became infected very rapidly and it was decided by the surgeon that the distal portion of the toe needed to be removed. The patient seems to be doing well in this regard he tells me. With that being said his lower extremities are doing better from the standpoint of the wounds although he is significantly swollen at this point. 07/09/18 on evaluation today patient appears to be doing better in regard to the wounds on his lower extremities. In fact everything is almost completely healed he is just a small area on the left posterior lower extremity that is open at this point. 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 With that being said he needs some kind of compression it does sound like he may not be wearing his compression, that is the wraps, during the entire time between when he's here visit to visit. Apparently his wife took the current one off because it began to "fall apart". 07/16/18 on evaluation today patient appears to be extremely swollen especially in regard to his left lower extremity unfortunately. He also has a new skin tear over the left lower extremity and there's a smaller area on the right lower extremity as well. Unfortunately this seems to be due in part to blistering and fluid buildup in his leg. He did get the reduction wraps that were ordered by the [facility] hospital for him to go to lymphedema clinic. With that being said his wounds on the legs have not healed to the point to where they would  likely accept them as a patient lymphedema clinic currently. We need to try to get this to heal. With that being said he's been taking his wraps off which is not doing him any favors at this point. In fact this is probably quite counterproductive compared to what needs to occur. We will likely need to increase to a four layer compression wrap and continue to also utilize elevation and he has to keep the wraps on not take them off as he's been doing currently hasn't had a wrap on since Saturday. 07/23/18 on evaluation today patient appears to be doing much better in regard to his bilateral lower extremities. In fact his left lower extremity which was the largest is actually 15 cm smaller today compared to what it was last time he was here in our clinic. This is obviously good news after just one week. Nonetheless the differences he actually kept the wraps on during the entire week this time. That's not typical for him. I do believe he understands a little bit better now the severity of the situation and why it's important for him to keep these wraps on. 07/30/18 on evaluation today patient actually appears to be doing rather well in regard to his lower extremities. His legs are much smaller than they have been in the past and he actually has only one very small rudder superficial region remaining that is not closed on the left lateral/posterior lower extremity even this is almost completely close. I do believe likely next week he will be healed without any complications. I do think we need to continue the wraps however this seems to be beneficial for him. I also think it may be a good time for [place]  to go ahead and see about getting the appointment with the lymphedema clinic which is supposed to be made for him in order to keep this moving along and hopefully get them into compression wraps that will in the end help him to remain healed. 08/06/18 on evaluation today patient actually appears to be doing very  well in regard as bilateral lower extremities. In fact his wounds appear to be completely healed at this time. 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 He is in the process of getting appointment with the lymphedema clinic we have made this referral were just waiting to hear back on the schedule time. We need to follow up on that today as well. 08/13/18 on evaluation today patient actually appears to be doing very well in regard to his bilateral lower extremities there are no open wounds at this point. We are gonna go ahead and see about ordering the Velcro compression wraps for him had a discussion with them about [place] doing it versus the [place] they feel like they can definitely afford going ahead and get the wraps themselves and they would prefer to try to avoid having to go to the lymphedema clinic if it all possible which I completely understand. As long as he has good compression I'm okay either way. EATHAN, GROMAN ([id number]) 3/17//20 on evaluation today patient actually appears to be doing well in regard to his bilateral lower extremity ulcers. He has been tolerating the dressing changes without complication specifically the compression wraps. Overall is had no issues my fingers finding that I see at this point is that he is having trouble with constipation. He tells me he has not been able to go to the bathroom for about six days. He's taken to over-the- counter oral laxatives unfortunately this is not helping. He has not contacted his doctor. 08/27/18 on evaluation today patient appears to be doing fairly well in regard to his lower extremities at this point. There does not appear to be any new altars is swelling is very well controlled. We are still waiting for his Velcro compression wraps to arrive that should be sometime in the next week is Artie sent the check for these. 09/03/18 on evaluation today patient appears to be doing  excellent in regard to his bilateral lower extremities which he shows no signs of wound openings in regard to this point. He does have his Velcro compression wraps which did arrive in the mail since I saw him last week. Overall he is doing excellent in my pinion. 09/13/18 on evaluation today patient appears to be doing very well currently in regard to the overall appearance of his bilateral lower extremities although he's a little bit more swollen than last time we saw him. At that point he been discharged without any open wounds. Nonetheless he has a small open wound on the posterior left lower extremity with some evidence of cellulitis noted as well. Fortunately I feel like he has made good progress overall with regard to his lower extremities from were things used to be. 09/20/18 on evaluation today patient actually appears to be doing much better. The erythematous lower extremity is improving wound itself which is still open appears to be doing much better as far as but appearance as well as pain is concerned overall very pleased in this regard. There's no signs of active infection at this time. 09/27/18 on evaluation today patient's wounds on the lower extremity actually appear to  be doing fairly well at this time which is good news. There is no evidence of active infection currently and again is just as left lower extremity were there any wounds at this point anyway. I believe they may be completely healed but again I'm not 100% sure based on evaluation today. I think one more week of observation would likely be a good idea. 10/04/18 on evaluation today patient actually appears to be doing excellent in regard to the left lower extremity which actually appears to be completely healed as of today. Unfortunately he's been having issues with his right lower extremity have a new wound that has opened. Fortunately there's no evidence of active infection at this time which is good news. 10/10/18 upon  evaluation today patient actually appears to be doing a little bit worse with the new open area on his right posterior lower extremity. He's been tolerating the dressing changes without complication. Right now we been using his compression wraps although I think we may need to switch back to actually performing bilateral compression wraps are in the clinic. No fevers, chills, nausea, or vomiting noted at this time. 10/17/18 on evaluation today patient actually appears to be doing quite well in regard to his bilateral lower extremity ulcers. He has been tolerating the reps without complication although he would prefer not to rewrap his legs as of today. Fortunately there's no signs of active infection which is good news. No fevers, chills, nausea, or vomiting noted at this time. 10/24/18 on evaluation today patient appears to be doing very well in regard to his lower extremities. His right lower extremity is shown signs of healing and his left lower [place] though not healed appears to be improving which is excellent news. Overall very pleased with how things seem to be progressing at this time. The patient is likewise happy to hear this. His Velcro compression wraps however have not been put on properly will gonna show his wife how to do that properly today. 10/31/18 on evaluation today patient appears to be doing more poorly in regard to his left lower extremity in particular although both lower extremities actually are showing some signs of being worse than my previous evaluation. Unfortunately I'm just not sure that his compression stockings even with the use of the compression/lymphedema pumps seem to be controlling this well. Upon further questioning he tells me that he also is not able to lie flat in the bed due to his congestive heart failure and difficulty breathing. For that reason he sleeps in his recliner. To make matters worse his recliner also cannot even hold his legs up so instead of even  being somewhat elevated they pretty much hang down to the floor. This is the way he sleeps each night which is definitely counterproductive to everything else that were attempting to do from the standpoint of controlling his fluid. Nonetheless I think that he potentially could benefit from a hospital bed although this would be something that his primary care provider would likely have to order since anything that is order on our side has to be directly related to wound care and again the hospital bed is not necessarily a direct relation to although I think it does contribute to his overall wound status on his lower extremities. 11/07/18 on evaluation today patient appears to be doing better in regard to his bilateral lower extremity. He's been tolerating the dressing changes without complication. We did in the interim since I last saw him switch to just using  extras orbiting new alginate and that seems to have done much better for him. I'm very pleased with the overall progress that is made. 11/14/18 on evaluation today patient appears to be redoing rather well in regard to his left lower extremity ulcers which are the only ones remaining at this point. Fortunately there's no signs of active infection at this time which is good news. Overall been very pleased with how things seem to be progressing currently. No fevers, chills, nausea, or vomiting noted at this time. 11/20/17 on evaluation today patient actually appears to be doing quite well in regard to his lower extremities on the left on the right he has several blisters that showed up although there's some question about whether or not he has had his broker compression wraps on like he was supposed to or not. Fortunately there's no signs of active infection at this time which is good news. Unfortunately though he is doing well from the standpoint of the left leg the right leg is not doing as well again this is when we did not wrap last week. 11/28/18  on evaluation today patient appears to be doing well in regard to his bilateral lower extremities is left appears to be healed is right is not healed but is very close to being so. Overall very pleased with how things seem to be progressing. Patient is likewise happy that things are doing well. 12/09/18 on evaluation today patient actually appears to be completely healed which is excellent news. He actually seems to be doing well in regard to the swelling of the bilateral lower extremities which is also great news. Overall very pleased with how things seem to be progressing. Readmission: 03/18/2019 upon evaluation today patient presents for reevaluation concerning issues that he is having with his left lower extremity where he does have a wound noted upon inspection today. He also has a wound on the right second toe on the tip where it is apparently been rubbing in his shoe I did look at issues and you can actually see where this has been occurring as well. Fortunately there is no signs of infection with regard to KENDRA, WOOLFORD. ([id number]) the toe necessarily although it is very swollen compared to normal that does have me somewhat concerned about the possibility of further evaluation for infection/osteomyelitis. I would recommend an x-ray to start with. Otherwise he states that it is been 1-2 weeks that he has had the draining in regard to left lower extremity he does not know how this happened he has been wearing his compression appropriately and I do see that as well today but nonetheless I do think that he needs to continue to be very cautious with regard to elevation as well. 03/25/2019 on evaluation today patient appears to be doing much better in regard to his lower extremity at this time. That is on the left. He did culture positive for Pseudomonas but the good news is he seems to be doing much better the gentamicin we applied topically seems to have done a great job for him. There is no  signs of active infection at this time systemically and locally he is doing much better. No fevers, chills, nausea, vomiting, or diarrhea. In regard to his right toe this seems to be doing much better there is very little pressure noted at this point I did clean up some of the callus today around the edges of the wound as well as the surface of the wound but to be honest  he is progressing quite nicely. 10/30; the area on the left anterior lower tibia area is healed over. His edema control is adequate even though his use of the compression pumps seems very intermittent. He has a juxta lite stocking on the right leg and a think there is 1 for the left leg at home. His wife is putting these on. He has an area on the plantar right second toe which is a hammertoe they are trying to offload this 04/08/2019 on evaluation today patient appears to be doing well in regard to his lower extremities which is good news. We are seeing him today for his toe ulcer which is giving him a little bit more trouble but again still seems to be doing better at this point which is good news. He is going require some sharp debridement in regard to the toe today however. 04/15/2019 on evaluation today patient actually appears to be doing quite well with regard to his toe ulcer. I am very pleased with how things are doing in that regard. With regard to his lower extremity edema in general he tells me he is not been using the lymphedema pumps regularly although he does not use them. I think that he needs to use them more regularly he does have a small blister on the left lower extremity this is not open at this point but obviously it something that can get worse if he does not keep the compression going and the pumps going as well. 04/22/2019 on evaluation today patient appears to be doing well with regard to his toe ulcer. He has been tolerating the dressing changes without complication. This is measuring slightly smaller this week  as compared to last week. Fortunately there is no signs of active infection at this time. 05/06/2019 on evaluation today patient actually appears to be doing excellent. In fact his toe appears to be completely healed which is great news. There is no signs of active infection at this time. Readmission: Patient presents today for follow-up after having been discharged at the beginning of December 2020. He states that in recent weeks things have reopened and has been having more trouble at this point. With that being said fortunately there is no signs of active infection at this time. No fever chills noted. Nonetheless he also does have an issue with one of his toes as well that seems to be somewhat this is on the right foot second toe. 07/25/2019 upon evaluation today patient's lower extremities appear to be doing excellent bilaterally. I really feel like he is showing signs of great improvement and in fact I think he is close to healing which is great news. There is no signs of active infection at this time. No fevers, chills, nausea, vomiting, or diarrhea. 07/31/2019 upon evaluation today patient appears to be doing excellent and in fact I think may be completely healed in regard to his right lower extremity that were still to monitor this 1 more week before closing it out. The left lower extremity is measuring smaller although not completely closed seems to be doing excellent. 08/07/2019 upon evaluation today patient appears to be doing excellent in regard to his wounds. In fact the right lower extremity is completely healed there is no issues here. The left lower extremity has 1 very small area still remaining fortunately there is no signs of infection and overall I think he is very close to closure of the here as well. 08/14/19 upon evaluation today patient appears to be doing  excellent in regard to his lower extremities. In fact he appears to be completely healed as of today. Fortunately there's no  signs of active infection at this time. No fevers, chills, nausea, or vomiting noted at this time. READMISSION 09/26/2019 This is a 76 year old man who is well-known to this clinic having just been discharged on 08/14/2019. He has known lymphedema and chronic venous insufficiency. He has Farrow wrap stockings and also external compression pumps. He does not use the external compression pumps however. He does however apparently fairly faithfully use the stockings. They have not been lotion in his legs. He has developed new wounds on the right anterior as well as the left medial left lateral and left posterior calf. He returns for our review of this Past medical history includes coronary artery disease, hypertension, congestive heart failure, COPD, venous insufficiency with lymphedema, type 2 diabetes. He apparently has compression pumps, Farrow wraps and at 1 point a hospital bed although I believe he sleeps in a recliner 10/03/2019 upon evaluation today patient appears to be doing better with regard to his wounds in general. He has been tolerating the dressing changes without complication. Fortunately there is no signs of active infection. No fevers, chills, nausea, vomiting, or diarrhea. I do believe the compression wraps are helping as they always have in the past. 10/10/2019 upon evaluation today patient appears to be doing excellent at this point. His right lower extremity is measuring much better and in fact is completely healed. His left lower extremity is also measuring better though not healed seems to be doing excellent at this point which is great news. Overall I am very pleased with how things appear currently. 10/27/2019 upon evaluation today patient appears to be doing quite well with regard to his wounds currently. He has been tolerating the dressing changes without complication. Fortunately there is no signs of active infection at this time. In fact he is almost completely healed on the left  and has just a very small area open and on the right he is completely healed. 11/04/2019 upon evaluation today patient appears to be doing quite well with regard to his wounds. In fact he appears to be quite possibly completely healed on the left lower extremity now as well the right lower extremity is still doing well. With that being said unfortunately though this may be healed I think it is a very fragile area and I cannot even confirm there is not a very small opening still remaining. I really think he may benefit from 1 additional weeks wrapping before discontinuing. Adam Merritt, Adam Merritt ([id number]) 11/10/2019 upon evaluation today patient appears to be doing well in regard to his original wounds in fact everything that we were treating last week is completely healed on the left. Unfortunately he has a new skin tear just above where the wrap slipped down due to a fall he sustained causing this injury. 11/17/2019 on evaluation today patient appears to be doing better with regard to his wound. He has been tolerating the dressing changes without complication. Fortunately there is no signs of active infection at this time. No fevers, chills, nausea, vomiting, or diarrhea. 11/24/2019 upon evaluation today patient appears to be doing well with regard to his wound. This is making good progress there does not appear to be signs of active infection but overall I do feel like he is headed in the correct direction. Overall he is tolerating the compression wrap as well. 12/02/2019 upon evaluation today patient appears to be doing well  with regard to his leg ulcer. In fact this appears to be completely healed and this is the last of the wounds that he had but still the left anterior lower extremity. Fortunately there is no signs of active infection at this time. No fevers, chills, nausea, vomiting, or diarrhea. Readmission: 02/05/2020 on evaluation today patient appears to be doing well with regard to his wound all  things considered. He actually tells me that a couple weeks ago he had trouble with his wraps and therefore took him off for a couple of days in order to give his legs a break. It was during this time that he actually developed increased swelling and edema of his legs and blisters on both legs. That is when he called to make the appointment. Nonetheless since that time he began wearing his compression wraps which are Velcro in the meantime and has done extremely well with this. In fact his leg appears to be under good control as far as edema is concerned today it is nothing like what I am seeing when he has come in for evaluations in the past. They have been using some of the silver alginate at home which has been beneficial for him. Fortunately there is no signs of active infection at this time. No fevers, chills, nausea, vomiting, or diarrhea. 02/19/2020 on evaluation today patient unfortunately does not appear to be doing quite as well as I would like to see. He has no signs of active infection at this time but unfortunately he is not doing well in regard to the overall appearance of his left leg. He has a couple other areas that are weeping now and the leg is much more swollen. I think that he needs to actually have a compression wrap to try to manage this. 02/26/2020 on evaluation today patient appears to be doing well with regard to his left lower extremity ulcer. Fortunately the swelling is down I do believe the pressure wrap seems to be doing quite well. Fortunately there is no signs of active infection at this time. 03/05/2020 upon evaluation today patient appears to be doing excellent in regard to his legs at this point. Fortunately there is no signs of active infection which is great news. Overall he feels like he is completely healed which is great news. Readmission: 05/20/2020 upon evaluation today patient appears to be doing well with regard to his leg ulcer all things considered. He is  actually coming back to see [place] after having had a reopening of the ulcers on his left leg in the past several weeks. He is out of dressing supplies and his wife is been try to take care of this as best she can. 06/10/2020 upon evaluation today patient unfortunately appears to be doing significantly worse today compared to when I last saw him. He and his wife both tell me that "there has been a lot going on". I did not actually go into detail about everything that has been happening but nonetheless he has not obviously been wearing his compression. He brings them with him today but they were not on. I am not even certain to be honest that he could get those on at this point. Nonetheless I do believe that he is going to require compression wrapping at some point shortly but right now we need to try to get what appears to be infection under control at this time. 06/28/2020 upon evaluation today patient appears to be doing well with regard to 06/28/2020 upon evaluation today  patient appears to be doing well pretty well in regard to his left leg which is much better than previous. With that being said in regard to his right leg he does seem to be having some issues with a new wound after he sustained a fall. This fortunately is not too deep but does appear to be something we need to address. Neither deep which is good. 07/05/2020 on evaluation today patient appears to be doing well with regard to his wounds. In fact everything on the left leg appears to be pretty much healed on the right leg this is very close though not completely closed yet. Fortunately there is no evidence of active infection at this time. No fevers, chills, nausea, vomiting, or diarrhea. 07/13/2019 upon evaluation today patient appears to be making good progress which is great news. Overall I am extremely pleased with where things stand today. There does not appear to be any signs of active infection which is great news and overall his left  leg appears healed still the right leg though not healed does seem to be making good progress which is excellent news. He is having no pain. 07/19/2020 on evaluation today patient appears to be doing well with regard to his wound. He has been tolerating the dressing changes without complication. Fortunately there does not appear to be any signs of active infection at this time. No fevers, chills, nausea, vomiting, or diarrhea. Readmission: 08/20/2020 on evaluation today patient presents for reevaluation here in the clinic concerning issues that he has been having with his bilateral lower extremities. Unfortunately this is an ongoing reoccurring issue. He tells me that he is really not certain exactly what caused the issue although as I discussed this further with him it appears that he has been sitting for the most part and sleeping in his chair with his feet on the ground he is not really elevating at all in fact the chair does not even have a reclining function therefore it is basically just him sitting with his feet on the ground. I think this alone has probably led to his increased swelling he is also having more difficulty walking which means that he is pumping less blood flow through his legs anyway as far as the venous stasis is concerned. All in all I think this is leading to a downward spiral of him not being able to walk very well as his legs are so heavy, they begin to weep, and overall he just does not seem to be doing as well as it was when I last saw him. 08/27/2020 upon evaluation today patient's legs actually are showing signs of improvement which is great news. There does not appear to be any evidence of infection which is also excellent news. I am extremely pleased with where things stand today. No fevers, chills, nausea, vomiting, or diarrhea. 09/03/20 upon evaluation today patient appears to be doing excellent in regard to his wounds currently. He has been tolerating the  dressing changes without complication and overall I am extremely pleased with where things stand today. No fevers, chills, nausea, vomiting, or diarrhea. Overall he is healed on the right leg although the left leg not being completely healed still is doing significantly better. Adam Merritt, Adam Merritt ([id number]) 09/10/2020/8/22 upon evaluation today patient appears to be doing well with regard to his wounds. He has been tolerating the dressing changes without complication. Fortunately he appears to be completely healed. I am very pleased in that regard. As is the patient his  right leg is also doing well he did get a new recliner which is helping him keep his feet off the ground which I think is helped he is down 2 cm on the left compared to last week so I think there is some definite improvement here. Readmission 12/15/20: Mr. Atharv Barriere is a 76 year old male with a past medical history of lymphedema, insulin-dependent type 2 diabetes, COPD and congestive heart failure that presents to the clinic for increased drainage from his legs bilaterally, Left greater than right. Patient has lymphedema. He has been seen in our clinic on multiple occasions for this issue. He currently denies signs of infection. He has not been using his lymphedema pumps. 12/23/2020 upon evaluation today patient appears to be doing well with regard to his wounds. He has been tolerating the dressing changes he really does not have any open wounds just weeping areas of the legs bilaterally and fortunately seems to be doing better at this point. 12/28/2020 upon evaluation today patient's wound is actually showing signs of good improvement. There does not appear to be any evidence of infection which is great news and overall I am extremely pleased at this point. I think is very close to complete resolution. 01/04/2021 upon evaluation today patient actually appears to be completely healed which is great news. I do not see any signs of active  infection currently which is also great news. In general I am extremely pleased with where we stand and I do believe that the patient is tolerating the dressing changes without complication. I believe he is ready to go back into his Velcro compression and I did see that they brought them with him today. Readmission: Patient presents today for follow-up he was last seen August 2. He is having some issues with some mild drainage and leaking from the left leg. This is something that has reopened and again is a recurrent issue for him. Subsequently he does wear his Velcro compression wraps though I think we may want to look into something a little bit better for him I am thinking that we may want to try the juxta fit compression which I think would do much more effective compression treatment overall for him. 04/08/2021 upon evaluation today patient appears to be doing well with regard to his legs. I do feel like he is showing signs of improvement which is great news. No fevers, chills, nausea, vomiting, or diarrhea. 04/15/2021 upon evaluation today patient's wounds actually appear to be very close to complete resolution. He is legs are actually doing well left leg just has a small area of leaking at this point. Overall I think that we are very close to complete resolution based on what I am seeing. Nonetheless he seems to not be feeling too well today I really do not know what exactly is going on here. 04/22/2021 upon evaluation patient appears to be completely healed based on what I see today. I do not see anything open both legs appear to be doing quite well. 05/20/2021 upon evaluation today patient appears to be doing poorly in regard to his legs. He has not been a full month since we last saw him and he has reopened unfortunately. He does not appear to be any signs of active infection at this time which is great news. No fevers, chills, nausea, vomiting, or diarrhea. 12/27; the patient comes in  the clinic today with severe bilateral lymphedema very dry skin but paradoxically his wounds are actually healed or should I say  not open. He has compression pumps that he does not use. [Once per week] he does not have compression stockings. He comes in today with a small presumably pressure ulcer on the lateral plantar heel on the right. The patient states he has been complaining about pain in this area for about 2 months although no wound was recognized until our intake nurse was doing her exam 06/16/2021 upon evaluation today patient appears to be doing better in regard to his wounds. He is not completely healed yet especially in regard to the right heel which is still open. Nonetheless I do think that his left leg is almost completely healed the right leg appears to be doing decently well his right heel is what still mainly open currently. 06/30/2021 upon evaluation today patient appears to be doing well with regard to his wound. Has been tolerating the dressing changes without complication. Fortunately I think his legs are doing quite well the heel is also doing better. In general I am very pleased with where we stand today. Objective Constitutional Well-nourished and well-hydrated in no acute distress. Vitals Time Taken: 12:40 PM, Height: 66 in, Weight: 300 lbs, BMI: 48.4, Temperature: 98.2 F, Pulse: 59 bpm, Respiratory Rate: 18 breaths/min, Blood Pressure: 153/72 mmHg. Respiratory normal breathing without difficulty. Psychiatric Adam Merritt, Adam Merritt ([id number]) this patient is able to make decisions and demonstrates good insight into disease process. Alert and Oriented x 3. pleasant and cooperative. General Notes: Patient's wound bed showed signs of good granulation and epithelization at this point. Fortunately I do not see any evidence again of anything draining in regard to the leg. With regard to his foot ulcer this is showing signs of improvement and is much smaller its not  completely closed as of yet. Integumentary (Hair, Skin) Wound #43 status is Open. Original cause of wound was Gradually Appeared. The date acquired was: 05/31/2021. The wound has been in treatment 4 weeks. The wound is located on the Right Calcaneus. The wound measures 0.2cm length x 0.2cm width x 0.1cm depth; 0.031cm^2 area and 0.003cm^3 volume. There is Fat Layer (Subcutaneous Tissue) exposed. There is no tunneling or undermining noted. There is a medium amount of serosanguineous drainage noted. There is medium (34-66%) pink granulation within the wound bed. There is a medium (34-66%) amount of necrotic tissue within the wound bed including Adherent Slough. Assessment Active Problems ICD-10 Lymphedema, not elsewhere classified Venous insufficiency (chronic) (peripheral) Non-pressure chronic ulcer of other part of right lower leg with fat layer exposed Non-pressure chronic ulcer of other part of left lower leg with fat layer exposed Pressure ulcer of right heel, stage 2 Plan Follow-up Appointments: Return Appointment in 1 week. Nurse Visit as needed Bathing/ Shower/ Hygiene: May shower with wound dressing protected with water repellent cover or cast protector. No tub bath. Anesthetic (Use 'Patient Medications' Section for Anesthetic Order Entry): Lidocaine applied to wound bed Edema Control - Lymphedema / Segmental Compressive Device / Other: Patient to wear own compression stockings. Remove compression stockings every night before going to bed and put on every morning when getting up. - left and right Elevate, Exercise Daily and Avoid Standing for Long Periods of Time. Elevate legs to the level of the heart and pump ankles as often as possible Elevate leg(s) parallel to the floor when sitting. Compression Pump: Use compression pump on left lower extremity for 60 minutes, twice daily. Compression Pump: Use compression pump on right lower extremity for 60 minutes, twice daily. WOUND  #43: - Calcaneus Wound Laterality:  Right Cleanser: Soap and Water 1 x Per Week/30 Days Discharge Instructions: Gently cleanse wound with antibacterial soap, rinse and pat dry prior to dressing wounds Topical: Triamcinolone Acetonide Cream, 0.1%, 15 (g) tube 1 x Per Week/30 Days Discharge Instructions: Apply as directed by provider. Primary Dressing: Silvercel Small 2x2 (in/in) 1 x Per Week/30 Days Discharge Instructions: Apply Silvercel Small 2x2 (in/in) as instructed Secondary Dressing: ABD Pad 5x9 (in/in) 1 x Per Week/30 Days Discharge Instructions: Cover with ABD pad Secondary Dressing: Kerlix 4.5 x 4.1 (in/yd) 1 x Per Week/30 Days Discharge Instructions: Apply Kerlix 4.5 x 4.1 (in/yd) as instructed Secured With: 30M Medipore H Soft Cloth Surgical Tape, 2x2 (in/yd) 1 x Per Week/30 Days 1. Would recommend currently that we going to continue with the wound care measures as before and the patient is in agreement with plan. This includes the use of the silver alginate dressing to the wound on the plantar aspect of his right heel followed by an ABD pad to secure in place and roll gauze to hold this in place. 2. We will get a continue to have him use his Velcro compression wraps at home. We will get this set up for him and show him how to use those today. 3. I am also can recommend he should continue to use his lymphedema pumps he should be doing that 2 times per day. We will see patient back for reevaluation in 1 week here in the clinic. If anything worsens or changes patient will contact our office for additional recommendations. Adam Merritt, POLLARD ([id number]) Electronic Signature(s) Signed: 06/30/2021 1:27:55 PM By: Worthy Keeler PA-C Entered By: Worthy Keeler on 06/30/2021 13:27:55 Adam Merritt ([id number]) -------------------------------------------------------------------------------- [facility]Bill Details Patient Name: Adam Merritt Date of Service: 06/30/2021 Medical Record Number:  [id number] Patient Account Number: [id number] Date of Birth/Sex: 1946/01/13 (76 y.o. 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 Pressure ulcer of right heel, stage 2 Facility Procedures CPT4 Code: 48270786 Description: 99213 - WOUND CARE VISIT-LEV 3 EST PT Modifier: Quantity: 1 Physician Procedures CPT4 Code: 7544920 Description: 10071 - WC PHYS LEVEL 3 - EST PT Modifier: Quantity: 1 CPT4 Code: Description: ICD-10 Diagnosis Description I89.0 Lymphedema, not elsewhere classified I87.2 Venous insufficiency (chronic) (peripheral) L97.812 Non-pressure chronic ulcer of other part of right lower leg with fat la L97.822 Non-pressure chronic ulcer of  other part of left lower leg with fat lay Modifier: yer exposed er exposed Quantity: Electronic Signature(s) Signed: 06/30/2021 1:28:16 PM By: Worthy Keeler PA-C Entered By: Worthy Keeler on 06/30/2021 13:28:15

## 2021-07-01 NOTE — Progress Notes (Signed)
Adam, Merritt ([id number]) Visit Report for 06/30/2021 Arrival Information Details Patient Name: Adam, Merritt Date of Service: 06/30/2021 12:30 PM Medical Record Number: [id number] Patient Account Number: [id number] Date of Birth/Sex: 04/28/1946 (75 y.o. M) Treating RN: Carlene Coria Primary Care Daijon Wenke: [name] Other Clinician: Referring Shaneal Barasch: [name] Treating Quadasia Newsham/Extender: Skipper Cliche in Treatment: 67 Visit Information History Since Last Visit All ordered tests and consults were completed: No Patient Arrived: Wheel Chair Added or deleted any medications: No Arrival Time: 12:33 Any new allergies or adverse reactions: No Accompanied By: wife Had a fall or experienced change in No Transfer Assistance: None activities of daily living that may affect Patient Identification Verified: Yes risk of falls: Secondary Verification Process Completed: Yes Signs or symptoms of abuse/neglect since last visito No Patient Requires Transmission-Based Precautions: No Hospitalized since last visit: No Patient Has Alerts: Yes Implantable device outside of the clinic excluding No Patient Alerts: diabetic type 2 cellular tissue based products placed in the center since last visit: Has Dressing in Place as Prescribed: Yes Has Compression in Place as Prescribed: Yes Pain Present Now: No Electronic Signature(s) Signed: 07/01/2021 2:31:10 PM By: Carlene Coria RN Entered By: Carlene Coria on 06/30/2021 12:40:55 Adam Merritt ([id number]) -------------------------------------------------------------------------------- Clinic Level of Care Assessment Details Patient Name: Adam Merritt Date of Service: 06/30/2021 12:30 PM Medical Record Number: [id number] Patient Account Number: [id number] Date of Birth/Sex: [birth year] (75 y.o. M) Treating RN: Carlene Coria Primary Care Elexa Kivi: [name] Other Clinician: Referring Daley Gosse: [name] Treating Annalyn Blecher/Extender: Skipper Cliche in Treatment: 13 Clinic Level of Care Assessment Items TOOL 4 Quantity Score X - Use when only an EandM is performed on FOLLOW-UP visit 1 0 ASSESSMENTS - Nursing Assessment / Reassessment X - Reassessment of Co-morbidities (includes updates in patient status) 1 10 X- 1 5 Reassessment of Adherence to Treatment Plan ASSESSMENTS - Wound and Skin Assessment / Reassessment X - Simple Wound Assessment / Reassessment - one wound 1 5 []  - 0 Complex Wound Assessment / Reassessment - multiple wounds []  - 0 Dermatologic / Skin Assessment (not related to wound area) ASSESSMENTS - Focused Assessment []  - Circumferential Edema Measurements - multi extremities 0 []  - 0 Nutritional Assessment / Counseling / Intervention []  - 0 Lower Extremity Assessment (monofilament, tuning fork, pulses) []  - 0 Peripheral Arterial Disease Assessment (using hand held doppler) ASSESSMENTS - Ostomy and/or Continence Assessment and Care []  - Incontinence Assessment and Management 0 []  - 0 Ostomy Care Assessment and Management (repouching, etc.) PROCESS - Coordination of Care X - Simple Patient / Family Education for ongoing care 1 15 []  - 0 Complex (extensive) Patient / Family Education for ongoing care []  - 0 Staff obtains Programmer, systems, Records, Test Results / Process Orders []  - 0 Staff telephones HHA, Nursing Homes / Clarify orders / etc []  - 0 Routine Transfer to another Facility (non-emergent condition) []  - 0 Routine Hospital Admission (non-emergent condition) []  - 0 New Admissions / Biomedical engineer / Ordering NPWT, Apligraf, etc. []  - 0 Emergency Hospital Admission (emergent condition) X- 1 10 Simple Discharge Coordination []  - 0 Complex (extensive) Discharge Coordination PROCESS - Special Needs []  - Pediatric / Minor Patient Management 0 []  - 0 Isolation Patient Management []  - 0 Hearing / Language / Visual special needs []  - 0 Assessment of Community assistance  (transportation, D/C planning, etc.) []  - 0 Additional assistance / Altered mentation []  - 0 Support Surface(s) Assessment (bed, cushion, seat, etc.) INTERVENTIONS - Wound Cleansing /  Measurement Adam, Merritt ([id number]) X- 1 5 Simple Wound Cleansing - one wound []  - 0 Complex Wound Cleansing - multiple wounds X- 1 5 Wound Imaging (photographs - any number of wounds) []  - 0 Wound Tracing (instead of photographs) X- 1 5 Simple Wound Measurement - one wound []  - 0 Complex Wound Measurement - multiple wounds INTERVENTIONS - Wound Dressings X - Small Wound Dressing one or multiple wounds 1 10 []  - 0 Medium Wound Dressing one or multiple wounds []  - 0 Large Wound Dressing one or multiple wounds X- 1 5 Application of Medications - topical []  - 0 Application of Medications - injection INTERVENTIONS - Miscellaneous []  - External ear exam 0 []  - 0 Specimen Collection (cultures, biopsies, blood, body fluids, etc.) []  - 0 Specimen(s) / Culture(s) sent or taken to Lab for analysis []  - 0 Patient Transfer (multiple staff / Civil Service fast streamer / Similar devices) []  - 0 Simple Staple / Suture removal (25 or less) []  - 0 Complex Staple / Suture removal (26 or more) []  - 0 Hypo / Hyperglycemic Management (close monitor of Blood Glucose) []  - 0 Ankle / Brachial Index (ABI) - do not check if billed separately X- 1 5 Vital Signs Has the patient been seen at the hospital within the last three years: Yes Total Score: 80 Level Of Care: New/Established - Level 3 Electronic Signature(s) Signed: 07/01/2021 2:31:10 PM By: Carlene Coria RN Entered By: Carlene Coria on 06/30/2021 13:23:34 Adam Merritt ([id number]) -------------------------------------------------------------------------------- Encounter Discharge Information Details Patient Name: Adam Merritt Date of Service: 06/30/2021 12:30 PM Medical Record Number: [id number] Patient Account Number: [id number] Date of Birth/Sex: June 28, 1945  (75 y.o. M) Treating RN: Carlene Coria Primary Care Keashia Haskins: [name] Other Clinician: Referring Orlena Garmon: [name] Treating Blease Capaldi/Extender: Skipper Cliche in Treatment: 13 Encounter Discharge Information Items Discharge Condition: Stable Ambulatory Status: Wheelchair Discharge Destination: Home Transportation: Private Auto Accompanied By: wife Schedule Follow-up Appointment: Yes Clinical Summary of Care: Patient Declined Electronic Signature(s) Signed: 07/01/2021 2:31:10 PM By: Carlene Coria RN Entered By: Carlene Coria on 06/30/2021 13:24:34 Adam Merritt ([id number]) -------------------------------------------------------------------------------- Lower Extremity Assessment Details Patient Name: Adam Merritt Date of Service: 06/30/2021 12:30 PM Medical Record Number: [id number] Patient Account Number: [id number] Date of Birth/Sex: 29-Jan-1946 (75 y.o. M) Treating RN: Carlene Coria Primary Care Jeilyn Reznik: [name] Other Clinician: Referring Brainard Highfill: [name] Treating Merrit Waugh/Extender: Jeri Cos [name]: 13 Edema Assessment Assessed: [Left: No] [Right: No] Edema: [Left: Yes] [Right: Yes] Calf Left: Right: Point of Measurement: 36 cm From Medial Instep 50 cm 43 cm Ankle Left: Right: Point of Measurement: 10 cm From Medial Instep 40 cm 29 cm Vascular Assessment Pulses: Dorsalis Pedis Palpable: [Left:Yes] [Right:Yes] Electronic Signature(s) Signed: 07/01/2021 2:31:10 PM By: Carlene Coria RN Entered By: Carlene Coria on 06/30/2021 12:51:05 Adam Merritt ([id number]) -------------------------------------------------------------------------------- Multi Wound Chart Details Patient Name: Adam Merritt Date of Service: 06/30/2021 12:30 PM Medical Record Number: [id number] Patient Account Number: [id number] Date of Birth/Sex: Feb 01, 1946 (75 y.o. M) Treating RN: Carlene Coria Primary Care Livy Ross: [name] Other Clinician: Referring Supreme Rybarczyk:  [name] Treating Dmario Russom/Extender: Skipper Cliche in Treatment: 13 Vital Signs Height(in): 20 Pulse(bpm): 43 Weight(lbs): 300 Blood Pressure(mmHg): 153/72 Body Mass Index(BMI): 48.4 Temperature(F): 98.2 Respiratory Rate(breaths/min): 18 Photos: [N/A:N/A] Wound Location: Right Calcaneus N/A N/A Wounding Event: Gradually Appeared N/A N/A Primary Etiology: Diabetic Wound/Ulcer of the Lower N/A N/A Extremity Comorbid History: Anemia, Lymphedema, Chronic N/A N/A Obstructive Pulmonary Disease (COPD), Sleep Apnea, Congestive Heart  Failure, Coronary Artery Disease, Hypertension, Peripheral Venous Disease, Type II Diabetes, Osteoarthritis, Neuropathy, Received Chemotherapy Date Acquired: 05/31/2021 N/A N/A Weeks of Treatment: 4 N/A N/A Wound Status: Open N/A N/A Wound Recurrence: No N/A N/A Measurements L x W x D (cm) 0.2x0.2x0.1 N/A N/A Area (cm) : 0.031 N/A N/A Volume (cm) : 0.003 N/A N/A % Reduction in Area: 97.90% N/A N/A % Reduction in Volume: 99.00% N/A N/A Classification: Grade 1 N/A N/A Exudate Amount: Medium N/A N/A Exudate Type: Serosanguineous N/A N/A Exudate Color: red, brown N/A N/A Granulation Amount: Medium (34-66%) N/A N/A Granulation Quality: Pink N/A N/A Necrotic Amount: Medium (34-66%) N/A N/A Exposed Structures: Fat Layer (Subcutaneous Tissue): N/A N/A Yes Epithelialization: Large (67-100%) N/A N/A Treatment Notes Electronic Signature(s) Signed: 07/01/2021 2:31:10 PM By: Carlene Coria RN Entered By: Carlene Coria on 06/30/2021 13:02:59 Adam Merritt ([id number]) -------------------------------------------------------------------------------- [place] Details Patient Name: Adam Merritt Date of Service: 06/30/2021 12:30 PM Medical Record Number: [id number] Patient Account Number: [id number] Date of Birth/Sex: [birth year] (75 y.o. M) Treating RN: Carlene Coria Primary Care Nely Dedmon: [name] Other Clinician: Referring  Kabeer Hoagland: [name] Treating Emmanuelle Coxe/Extender: Skipper Cliche in Treatment: 13 Active Inactive Electronic Signature(s) Signed: 07/01/2021 2:31:10 PM By: Carlene Coria RN Entered By: Carlene Coria on 06/30/2021 13:02:45 Adam Merritt ([id number]) -------------------------------------------------------------------------------- Pain Assessment Details Patient Name: Adam Merritt Date of Service: 06/30/2021 12:30 PM Medical Record Number: [id number] Patient Account Number: [id number] Date of Birth/Sex: 1946-01-04 (75 y.o. M) Treating RN: Carlene Coria Primary Care Kaveri Perras: [name] Other Clinician: Referring Myliyah Rebuck: [name] Treating Sakeenah Valcarcel/Extender: Skipper Cliche in Treatment: 13 Active Problems Location of Pain Severity and Description of Pain Patient Has Paino No Site Locations Pain Management and Medication Current Pain Management: Electronic Signature(s) Signed: 07/01/2021 2:31:10 PM By: Carlene Coria RN Entered By: Carlene Coria on 06/30/2021 12:41:23 Adam Merritt ([id number]) -------------------------------------------------------------------------------- Patient/Caregiver Education Details Patient Name: Adam Merritt Date of Service: 06/30/2021 12:30 PM Medical Record Number: [id number] Patient Account Number: [id number] Date of Birth/Gender: 1946/05/22 (75 y.o. M) Treating RN: Carlene Coria Primary Care Physician: [name] Other Clinician: Referring Physician: [name] Treating Physician/Extender: Skipper Cliche in Treatment: 13 Education Assessment Education Provided To: Patient Education Topics Provided Wound/Skin Impairment: Methods: Explain/Verbal Responses: State content correctly Electronic Signature(s) Signed: 07/01/2021 2:31:10 PM By: Carlene Coria RN Entered By: Carlene Coria on 06/30/2021 13:23:50 Adam Merritt ([id number]) -------------------------------------------------------------------------------- Wound Assessment  Details Patient Name: Adam Merritt Date of Service: 06/30/2021 12:30 PM Medical Record Number: [id number] Patient Account Number: [id number] Date of Birth/Sex: [birth year] (75 y.o. M) Treating RN: Carlene Coria Primary Care Marley Charlot: [name] Other Clinician: Referring Zion Lint: [name] Treating Gizel Riedlinger/Extender: Skipper Cliche in Treatment: 13 Wound Status Wound Number: 43 Primary Diabetic Wound/Ulcer of the Lower Extremity Etiology: Wound Location: Right Calcaneus Wound Open Wounding Event: Gradually Appeared Status: Date Acquired: 05/31/2021 Comorbid Anemia, Lymphedema, Chronic Obstructive Pulmonary Weeks Of Treatment: 4 History: Disease (COPD), Sleep Apnea, Congestive Heart Failure, Clustered Wound: No Coronary Artery Disease, Hypertension, Peripheral Venous Disease, Type II Diabetes, Osteoarthritis, Neuropathy, Received Chemotherapy Photos Wound Measurements Length: (cm) 0.2 Width: (cm) 0.2 Depth: (cm) 0.1 Area: (cm) 0.031 Volume: (cm) 0.003 % Reduction in Area: 97.9% % Reduction in Volume: 99% Epithelialization: Large (67-100%) Tunneling: No Undermining: No Wound Description Classification: Grade 1 Exudate Amount: Medium Exudate Type: Serosanguineous Exudate Color: red, brown Foul Odor After Cleansing: No Slough/Fibrino Yes Wound Bed Granulation Amount: Medium (34-66%) Exposed Structure Granulation Quality: Pink Fat Layer (Subcutaneous  Tissue) Exposed: Yes Necrotic Amount: Medium (34-66%) Necrotic Quality: Adherent Slough Treatment Notes Wound #43 (Calcaneus) Wound Laterality: Right Cleanser Soap and Water Discharge Instruction: Gently cleanse wound with antibacterial soap, rinse and pat dry prior to dressing wounds Peri-Wound Care Topical Adam, Merritt. ([id number]) Triamcinolone Acetonide Cream, 0.1%, 15 (g) tube Discharge Instruction: Apply as directed by Aliayah Tyer. Primary Dressing Silvercel Small 2x2 (in/in) Discharge Instruction:  Apply Silvercel Small 2x2 (in/in) as instructed Secondary Dressing ABD Pad 5x9 (in/in) Discharge Instruction: Cover with ABD pad Kerlix 4.5 x 4.1 (in/yd) Discharge Instruction: Apply Kerlix 4.5 x 4.1 (in/yd) as instructed Secured With 6M [place] [facility] Tape, 2x2 (in/yd) Compression Wrap Compression Stockings Add-Ons Electronic Signature(s) Signed: 07/01/2021 2:31:10 PM By: Carlene Coria RN Entered By: Carlene Coria on 06/30/2021 12:49:46 Adam Merritt ([id number]) -------------------------------------------------------------------------------- [place] Details Patient Name: Adam Merritt Date of Service: 06/30/2021 12:30 PM Medical Record Number: [id number] Patient Account Number: [id number] Date of Birth/Sex: [birth year] (75 y.o. M) Treating RN: Carlene Coria Primary Care Sindy Mccune: [name] Other Clinician: Referring Shravan Salahuddin: [name] Treating Kimbly Eanes/Extender: Skipper Cliche in Treatment: 13 Vital Signs Time Taken: 12:40 Temperature (F): 98.2 Height (in): 66 Pulse (bpm): 59 Weight (lbs): 300 Respiratory Rate (breaths/min): 18 Body Mass Index (BMI): 48.4 Blood Pressure (mmHg): 153/72 Reference Range: 80 - 120 mg / dl Electronic Signature(s) Signed: 07/01/2021 2:31:10 PM By: Carlene Coria RN Entered By: Carlene Coria on 06/30/2021 12:41:15

## 2021-07-11 ENCOUNTER — Telehealth: Payer: Self-pay

## 2021-07-11 NOTE — Telephone Encounter (Signed)
Verbal order for Nebulizer signed by provider and placed in [facility] folder.

## 2021-07-13 ENCOUNTER — Telehealth: Payer: Self-pay

## 2021-07-13 ENCOUNTER — Other Ambulatory Visit: Payer: Self-pay

## 2021-07-13 ENCOUNTER — Ambulatory Visit (INDEPENDENT_AMBULATORY_CARE_PROVIDER_SITE_OTHER): Payer: Medicare PPO | Admitting: Internal Medicine

## 2021-07-13 DIAGNOSIS — R0602 Shortness of breath: Secondary | ICD-10-CM

## 2021-07-13 NOTE — Telephone Encounter (Signed)
Verbal order for nebulizer signed by provider and placed in [facility] folder.

## 2021-07-14 ENCOUNTER — Encounter: Payer: Medicare PPO | Attending: Physician Assistant | Admitting: Physician Assistant

## 2021-07-14 DIAGNOSIS — L97822 Non-pressure chronic ulcer of other part of left lower leg with fat layer exposed: Secondary | ICD-10-CM | POA: Insufficient documentation

## 2021-07-14 DIAGNOSIS — I509 Heart failure, unspecified: Secondary | ICD-10-CM | POA: Insufficient documentation

## 2021-07-14 DIAGNOSIS — I872 Venous insufficiency (chronic) (peripheral): Secondary | ICD-10-CM | POA: Insufficient documentation

## 2021-07-14 DIAGNOSIS — E1151 Type 2 diabetes mellitus with diabetic peripheral angiopathy without gangrene: Secondary | ICD-10-CM | POA: Insufficient documentation

## 2021-07-14 DIAGNOSIS — L89612 Pressure ulcer of right heel, stage 2: Secondary | ICD-10-CM | POA: Insufficient documentation

## 2021-07-14 DIAGNOSIS — E11621 Type 2 diabetes mellitus with foot ulcer: Secondary | ICD-10-CM | POA: Diagnosis not present

## 2021-07-14 DIAGNOSIS — M1990 Unspecified osteoarthritis, unspecified site: Secondary | ICD-10-CM | POA: Insufficient documentation

## 2021-07-14 DIAGNOSIS — I110 Hypertensive heart disease with heart failure: Secondary | ICD-10-CM | POA: Diagnosis not present

## 2021-07-14 DIAGNOSIS — L97812 Non-pressure chronic ulcer of other part of right lower leg with fat layer exposed: Secondary | ICD-10-CM | POA: Insufficient documentation

## 2021-07-14 DIAGNOSIS — I890 Lymphedema, not elsewhere classified: Secondary | ICD-10-CM | POA: Diagnosis not present

## 2021-07-14 DIAGNOSIS — E1140 Type 2 diabetes mellitus with diabetic neuropathy, unspecified: Secondary | ICD-10-CM | POA: Insufficient documentation

## 2021-07-14 DIAGNOSIS — L97412 Non-pressure chronic ulcer of right heel and midfoot with fat layer exposed: Secondary | ICD-10-CM | POA: Diagnosis not present

## 2021-07-15 NOTE — Progress Notes (Signed)
DORRIS, VANGORDER ([id number]) Visit Report for 07/14/2021 Arrival Information Details Patient Name: Adam Merritt, Adam Merritt Date of Service: 07/14/2021 12:30 PM Medical Record Number: [id number] Patient Account Number: [id number] Date of Birth/Sex: [birth year] (75 y.o. M) Treating RN: Carlene Coria Primary Care Syble Picco: [name] Other Clinician: Referring Arvel Oquinn: [name] Treating Norberto Wishon/Extender: Skipper Cliche in Treatment: 15 Visit Information History Since Last Visit All ordered tests and consults were completed: No Patient Arrived: Wheel Chair Added or deleted any medications: No Arrival Time: 12:37 Any new allergies or adverse reactions: No Accompanied By: wife Had a fall or experienced change in No Transfer Assistance: None activities of daily living that may affect Patient Identification Verified: Yes risk of falls: Secondary Verification Process Completed: Yes Signs or symptoms of abuse/neglect since last visito No Patient Requires Transmission-Based Precautions: No Hospitalized since last visit: No Patient Has Alerts: Yes Implantable device outside of the clinic excluding No Patient Alerts: diabetic type 2 cellular tissue based products placed in the center since last visit: Has Dressing in Place as Prescribed: Yes Has Compression in Place as Prescribed: Yes Pain Present Now: Yes Electronic Signature(s) Signed: 07/15/2021 9:21:59 AM By: Carlene Coria RN Entered By: Carlene Coria on 07/14/2021 12:42:16 Adam Merritt ([id number]) -------------------------------------------------------------------------------- Clinic Level of Care Assessment Details Patient Name: Adam Merritt Date of Service: 07/14/2021 12:30 PM Medical Record Number: [id number] Patient Account Number: [id number] Date of Birth/Sex: January 22, 1946 (75 y.o. M) Treating RN: Carlene Coria Primary Care Chiyeko Ferre: [name] Other Clinician: Referring Candis Kabel: [name] Treating Samuel Mcpeek/Extender: Skipper Cliche in Treatment: 15 Clinic Level of Care Assessment Items TOOL 4 Quantity Score []  - Use when only an EandM is performed on FOLLOW-UP visit 0 ASSESSMENTS - Nursing Assessment / Reassessment []  - Reassessment of Co-morbidities (includes updates in patient status) 0 []  - 0 Reassessment of Adherence to Treatment Plan ASSESSMENTS - Wound and Skin Assessment / Reassessment []  - Simple Wound Assessment / Reassessment - one wound 0 []  - 0 Complex Wound Assessment / Reassessment - multiple wounds []  - 0 Dermatologic / Skin Assessment (not related to wound area) ASSESSMENTS - Focused Assessment []  - Circumferential Edema Measurements - multi extremities 0 []  - 0 Nutritional Assessment / Counseling / Intervention []  - 0 Lower Extremity Assessment (monofilament, tuning fork, pulses) []  - 0 Peripheral Arterial Disease Assessment (using hand held doppler) ASSESSMENTS - Ostomy and/or Continence Assessment and Care []  - Incontinence Assessment and Management 0 []  - 0 Ostomy Care Assessment and Management (repouching, etc.) PROCESS - Coordination of Care []  - Simple Patient / Family Education for ongoing care 0 []  - 0 Complex (extensive) Patient / Family Education for ongoing care []  - 0 Staff obtains Programmer, systems, Records, Test Results / Process Orders []  - 0 Staff telephones HHA, Nursing Homes / Clarify orders / etc []  - 0 Routine Transfer to another Facility (non-emergent condition) []  - 0 Routine Hospital Admission (non-emergent condition) []  - 0 New Admissions / Biomedical engineer / Ordering NPWT, Apligraf, etc. []  - 0 Emergency Hospital Admission (emergent condition) []  - 0 Simple Discharge Coordination []  - 0 Complex (extensive) Discharge Coordination PROCESS - Special Needs []  - Pediatric / Minor Patient Management 0 []  - 0 Isolation Patient Management []  - 0 Hearing / Language / Visual special needs []  - 0 Assessment of Community assistance (transportation,  D/C planning, etc.) []  - 0 Additional assistance / Altered mentation []  - 0 Support Surface(s) Assessment (bed, cushion, seat, etc.) INTERVENTIONS - Wound Cleansing / Measurement [name]  W. ([id number]) []  - 0 Simple Wound Cleansing - one wound []  - 0 Complex Wound Cleansing - multiple wounds []  - 0 Wound Imaging (photographs - any number of wounds) []  - 0 Wound Tracing (instead of photographs) []  - 0 Simple Wound Measurement - one wound []  - 0 Complex Wound Measurement - multiple wounds INTERVENTIONS - Wound Dressings []  - Small Wound Dressing one or multiple wounds 0 []  - 0 Medium Wound Dressing one or multiple wounds []  - 0 Large Wound Dressing one or multiple wounds []  - 0 Application of Medications - topical []  - 0 Application of Medications - injection INTERVENTIONS - Miscellaneous []  - External ear exam 0 []  - 0 Specimen Collection (cultures, biopsies, blood, body fluids, etc.) []  - 0 Specimen(s) / Culture(s) sent or taken to Lab for analysis []  - 0 Patient Transfer (multiple staff / Civil Service fast streamer / Similar devices) []  - 0 Simple Staple / Suture removal (25 or less) []  - 0 Complex Staple / Suture removal (26 or more) []  - 0 Hypo / Hyperglycemic Management (close monitor of Blood Glucose) []  - 0 Ankle / Brachial Index (ABI) - do not check if billed separately []  - 0 Vital Signs Has the patient been seen at the hospital within the last three years: Yes Total Score: 0 Level Of Care: ____ Electronic Signature(s) Signed: 07/15/2021 9:21:59 AM By: Carlene Coria RN Entered By: Carlene Coria on 07/14/2021 13:11:09 Adam Merritt ([id number]) -------------------------------------------------------------------------------- Encounter Discharge Information Details Patient Name: Adam Merritt Date of Service: 07/14/2021 12:30 PM Medical Record Number: [id number] Patient Account Number: [id number] Date of Birth/Sex: 07-21-1945 (75 y.o. M) Treating RN: Carlene Coria Primary Care Jacy Howat: [name] Other Clinician: Referring Cassidi Modesitt: [name] Treating [place] Frieden/Extender: Skipper Cliche in Treatment: 15 Encounter Discharge Information Items Post Procedure Vitals Discharge Condition: Stable Temperature (F): 97.9 Ambulatory Status: Wheelchair Pulse (bpm): 61 Discharge Destination: Home Respiratory Rate (breaths/min): 18 Transportation: Private Auto Blood Pressure (mmHg): 118/70 Accompanied By: wife Schedule Follow-up Appointment: Yes Clinical Summary of Care: Patient Declined Electronic Signature(s) Signed: 07/15/2021 9:21:59 AM By: Carlene Coria RN Entered By: Carlene Coria on 07/14/2021 13:12:20 Adam Merritt ([id number]) -------------------------------------------------------------------------------- Lower Extremity Assessment Details Patient Name: Adam Merritt Date of Service: 07/14/2021 12:30 PM Medical Record Number: [id number] Patient Account Number: [id number] Date of Birth/Sex: Dec 19, 1945 (75 y.o. M) Treating RN: Carlene Coria Primary Care Augustina Braddock: [name] Other Clinician: Referring Conall Vangorder: [name] Treating Ashaad Gaertner/Extender: Jeri Cos [name]: 15 Edema Assessment Assessed: [Left: No] [Right: No] Edema: [Left: Ye] [Right: s] Calf Left: Right: Point of Measurement: 36 cm From Medial Instep 48 cm Ankle Left: Right: Point of Measurement: 10 cm From Medial Instep 28 cm Vascular Assessment Pulses: Dorsalis Pedis Palpable: [Right:Yes] Electronic Signature(s) Signed: 07/15/2021 9:21:59 AM By: Carlene Coria RN Entered By: Carlene Coria on 07/14/2021 12:49:36 Adam Merritt ([id number]) -------------------------------------------------------------------------------- Multi Wound Chart Details Patient Name: Adam Merritt Date of Service: 07/14/2021 12:30 PM Medical Record Number: [id number] Patient Account Number: [id number] Date of Birth/Sex: 08-12-1945 (75 y.o. M) Treating RN: Carlene Coria Primary Care Jerrian Mells: [name] Other Clinician: Referring Jadaya Sommerfield: [name] Treating Minami Arriaga/Extender: Skipper Cliche in Treatment: 15 Vital Signs Height(in): 50 Pulse(bpm): 28 Weight(lbs): 300 Blood Pressure(mmHg): 118/70 Body Mass Index(BMI): 48.4 Temperature(F): 97.9 Respiratory Rate(breaths/min): 18 Photos: [N/A:N/A] Wound Location: Right Calcaneus N/A N/A Wounding Event: Gradually Appeared N/A N/A Primary Etiology: Diabetic Wound/Ulcer of the Lower N/A N/A Extremity Comorbid History: Anemia, Lymphedema, Chronic N/A N/A Obstructive Pulmonary Disease (  COPD), Sleep Apnea, Congestive Heart Failure, Coronary Artery Disease, Hypertension, Peripheral Venous Disease, Type II Diabetes, Osteoarthritis, Neuropathy, Received Chemotherapy Date Acquired: 05/31/2021 N/A N/A Weeks of Treatment: 6 N/A N/A Wound Status: Open N/A N/A Wound Recurrence: No N/A N/A Measurements L x W x D (cm) 0.4x0.4x0.2 N/A N/A Area (cm) : 0.126 N/A N/A Volume (cm) : 0.025 N/A N/A % Reduction in Area: 91.60% N/A N/A % Reduction in Volume: 91.60% N/A N/A Starting Position 1 (o'clock): 10 Ending Position 1 (o'clock): 3 Maximum Distance 1 (cm): 0.6 Undermining: Yes N/A N/A Classification: Grade 1 N/A N/A Exudate Amount: Medium N/A N/A Exudate Type: Serosanguineous N/A N/A Exudate Color: red, brown N/A N/A Granulation Amount: Medium (34-66%) N/A N/A Granulation Quality: Pink N/A N/A Necrotic Amount: Medium (34-66%) N/A N/A Exposed Structures: Fat Layer (Subcutaneous Tissue): N/A N/A Yes Epithelialization: Large (67-100%) N/A N/A Treatment Notes Electronic Signature(s) Signed: 07/15/2021 9:21:59 AM By: Carlene Coria RN Adam Merritt ([id number]) Entered By: Carlene Coria on 07/14/2021 13:09:01 Adam Merritt ([id number]) -------------------------------------------------------------------------------- [place] Details Patient Name: Adam Merritt Date of  Service: 07/14/2021 12:30 PM Medical Record Number: [id number] Patient Account Number: [id number] Date of Birth/Sex: [birth year] (75 y.o. M) Treating RN: Carlene Coria Primary Care Savas Elvin: [name] Other Clinician: Referring Kuba Shepherd: [name] Treating Feleshia Zundel/Extender: Jeri Cos [name]: 15 Active Inactive Electronic Signature(s) Signed: 07/15/2021 9:21:59 AM By: Carlene Coria RN Entered By: Carlene Coria on 07/14/2021 13:08:48 Adam Merritt ([id number]) -------------------------------------------------------------------------------- Pain Assessment Details Patient Name: Adam Merritt Date of Service: 07/14/2021 12:30 PM Medical Record Number: [id number] Patient Account Number: [id number] Date of Birth/Sex: 01-01-1946 (75 y.o. M) Treating RN: Carlene Coria Primary Care Ramil Edgington: [name] Other Clinician: Referring Olympia Adelsberger: [name] Treating Florabelle [name]/Extender: Skipper Cliche in Treatment: 15 Active Problems Location of Pain Severity and Description of Pain Patient Has Paino Yes Site Locations With Dressing Change: Yes Duration of the Pain. Constant / Intermittento Intermittent Rate the pain. Current Pain Level: 7 Worst Pain Level: 10 Least Pain Level: 0 Tolerable Pain Level: 5 Character of Pain Describe the Pain: Throbbing Pain Management and Medication Current Pain Management: Medication: Yes Cold Application: No Rest: Yes Massage: No Activity: No T.E.N.S.: No Heat Application: No Leg drop or elevation: No Is the Current Pain Management Adequate: Inadequate How does your wound impact your activities of daily livingo Sleep: No Bathing: No Appetite: No Relationship With Others: No Bladder Continence: No Emotions: No Bowel Continence: No Work: No Toileting: No Drive: No Dressing: No Hobbies: No Electronic Signature(s) Signed: 07/15/2021 9:21:59 AM By: Carlene Coria RN Entered By: Carlene Coria on 07/14/2021 12:43:33 Adam Merritt  ([id number]) -------------------------------------------------------------------------------- Patient/Caregiver Education Details Patient Name: Adam Merritt Date of Service: 07/14/2021 12:30 PM Medical Record Number: [id number] Patient Account Number: [id number] Date of Birth/Gender: 02/18/1946 (75 y.o. M) Treating RN: Carlene Coria Primary Care Physician: [name] Other Clinician: Referring Physician: [name] Treating Physician/Extender: Skipper Cliche in Treatment: 15 Education Assessment Education Provided To: Patient Education Topics Provided Wound/Skin Impairment: Methods: Explain/Verbal Responses: State content correctly Electronic Signature(s) Signed: 07/15/2021 9:21:59 AM By: Carlene Coria RN Entered By: Carlene Coria on 07/14/2021 13:11:25 Adam Merritt ([id number]) -------------------------------------------------------------------------------- Wound Assessment Details Patient Name: Adam Merritt Date of Service: 07/14/2021 12:30 PM Medical Record Number: [id number] Patient Account Number: [id number] Date of Birth/Sex: January 27, 1946 (75 y.o. M) Treating RN: Carlene Coria Primary Care Rayner Erman: [name] Other Clinician: Referring Trew Sunde: [name] Treating Levora Werden/Extender: Jeri Cos [name]: 15 Wound  Status Wound Number: [id number] Primary Diabetic Wound/Ulcer of the Lower Extremity Etiology: Wound Location: Right Calcaneus Wound Open Wounding Event: Gradually Appeared Status: Date Acquired: 05/31/2021 Comorbid Anemia, Lymphedema, Chronic Obstructive Pulmonary Weeks Of Treatment: 6 History: Disease (COPD), Sleep Apnea, Congestive Heart Failure, Clustered Wound: No Coronary Artery Disease, Hypertension, Peripheral Venous Disease, Type II Diabetes, Osteoarthritis, Neuropathy, Received Chemotherapy Photos Wound Measurements Length: (cm) 0.4 % Reduct Width: (cm) 0.4 % Reduct Depth: (cm) 0.2 Epitheli Area: (cm) 0.126 Tunneli Volume: (cm) 0.025  Undermi Start Endin Maxim ion in Area: 91.6% ion in Volume: 91.6% alization: Large (67-100%) ng: No ning: Yes ing Position (o'clock): 10 g Position (o'clock): 3 um Distance: (cm) 0.6 Wound Description Classification: Grade 1 Foul Odo Exudate Amount: Medium Slough/F Exudate Type: Serosanguineous Exudate Color: red, brown r After Cleansing: No ibrino Yes Wound Bed Granulation Amount: Medium (34-66%) Exposed Structure Granulation Quality: Pink Fat Layer (Subcutaneous Tissue) Exposed: Yes Necrotic Amount: Medium (34-66%) Necrotic Quality: Adherent Slough Treatment Notes Wound #43 (Calcaneus) Wound Laterality: Right Cleanser Soap and Water ZAKARY, KIMURA ([id number]) Discharge Instruction: Gently cleanse wound with antibacterial soap, rinse and pat dry prior to dressing wounds Peri-Wound Care Topical Triamcinolone Acetonide Cream, 0.1%, 15 (g) tube Discharge Instruction: Apply as directed by Zlatan Hornback. Primary Dressing Silvercel Small 2x2 (in/in) Discharge Instruction: Apply Silvercel Small 2x2 (in/in) as instructed Secondary Dressing ABD Pad 5x9 (in/in) Discharge Instruction: Cover with ABD pad Kerlix 4.5 x 4.1 (in/yd) Discharge Instruction: Apply Kerlix 4.5 x 4.1 (in/yd) as instructed Secured With 76M [place] Surgical Tape, 2x2 (in/yd) Compression Wrap Compression Stockings Add-Ons Electronic Signature(s) Signed: 07/15/2021 9:21:59 AM By: Carlene Coria RN Entered By: Carlene Coria on 07/14/2021 13:07:14 Adam Merritt ([id number]) -------------------------------------------------------------------------------- [place] Details Patient Name: Adam Merritt Date of Service: 07/14/2021 12:30 PM Medical Record Number: [id number] Patient Account Number: [id number] Date of Birth/Sex: 11-28-1945 (75 y.o. M) Treating RN: Carlene Coria Primary Care Suzy Kugel: [name] Other Clinician: Referring Hajra Port: [name] Treating Merryl Buckels/Extender: Skipper Cliche  in Treatment: 15 Vital Signs Time Taken: 12:42 Temperature (F): 97.9 Height (in): 66 Pulse (bpm): 61 Weight (lbs): 300 Respiratory Rate (breaths/min): 18 Body Mass Index (BMI): 48.4 Blood Pressure (mmHg): 118/70 Reference Range: 80 - 120 mg / dl Electronic Signature(s) Signed: 07/15/2021 9:21:59 AM By: Carlene Coria RN Entered By: Carlene Coria on 07/14/2021 12:42:34

## 2021-07-15 NOTE — Progress Notes (Signed)
MARSHELL, DILAURO ([id number]) Visit Report for 07/14/2021 Chief Complaint Document Details Patient Name: TYROME, DONATELLI Date of Service: 07/14/2021 12:30 PM Medical Record Number: [id number] Patient Account Number: [id number] Date of Birth/Sex: [birth year] (75 y.o. M) Treating RN: Carlene Coria Primary Care Provider: [name] Other Clinician: Referring Provider: [name] Treating Provider/Extender: Skipper Cliche in Treatment: 15 Information Obtained from: Patient Chief Complaint Bilateral LE edema and weeping ulcers Electronic Signature(s) Signed: 07/14/2021 1:17:06 PM By: Worthy Keeler PA-C Entered By: Worthy Keeler on 07/14/2021 13:17:05 [name] ([id number]) -------------------------------------------------------------------------------- Debridement Details Patient Name: [name] Date of Service: 07/14/2021 12:30 PM Medical Record Number: [id number] Patient Account Number: [id number] Date of Birth/Sex: Nov 29, 1945 (75 y.o. M) Treating RN: Carlene Coria Primary Care Provider: [name] Other Clinician: Referring Provider: [name] Treating Provider/Extender: Skipper Cliche in Treatment: 15 Debridement Performed for Wound #43 Right Calcaneus Assessment: Performed By: Physician Tommie Sams., PA-C Debridement Type: Debridement Severity of Tissue Pre Debridement: Fat layer exposed Level of Consciousness (Pre- Awake and Alert procedure): Pre-procedure Verification/Time Out Yes - 13:07 Taken: Start Time: 13:07 Total Area Debrided (L x W): 0.4 (cm) x 0.4 (cm) = 0.16 (cm) Tissue and other material Viable, Non-Viable, Slough, Subcutaneous, Skin: Dermis , Skin: Epidermis, Slough debrided: Level: Skin/Subcutaneous Tissue Debridement Description: Excisional Instrument: Curette Bleeding: Minimum Hemostasis Achieved: Pressure End Time: 13:10 Procedural Pain: 0 Post Procedural Pain: 0 Response to Treatment: Procedure was tolerated well Level of  Consciousness (Post- Awake and Alert procedure): Post Debridement Measurements of Total Wound Length: (cm) 0.4 Width: (cm) 0.4 Depth: (cm) 0.2 Volume: (cm) 0.025 Character of Wound/Ulcer Post Debridement: Improved Severity of Tissue Post Debridement: Fat layer exposed Post Procedure Diagnosis Same as Pre-procedure Electronic Signature(s) Signed: 07/14/2021 5:14:54 PM By: Worthy Keeler PA-C Signed: 07/15/2021 9:21:59 AM By: Carlene Coria RN Entered By: Carlene Coria on 07/14/2021 13:10:15 [name] ([id number]) -------------------------------------------------------------------------------- HPI Details Patient Name: [name] Date of Service: 07/14/2021 12:30 PM Medical Record Number: [id number] Patient Account Number: [id number] Date of Birth/Sex: 10-20-1945 (75 y.o. M) Treating RN: Carlene Coria Primary Care Provider: [name] Other Clinician: Referring Provider: [name] Treating Provider/Extender: Skipper Cliche in Treatment: 15 History of Present Illness HPI Description: 10/18/17-He is here for initial evaluation of bilateral lower extremity ulcerations in the presence of venous insufficiency and lymphedema. He has been seen by vascular medicine in the past, Dr. Lucky Cowboy, last seen in 2016. He does have a history of abnormal ABIs, which is to be expected given his lymphedema and venous insufficiency. According to Epic, it appears that all attempts for arterial evaluation and/or angiography were not follow through with by patient. He does have a history of being seen in lymphedema clinic in 2018, stopped going approximately 6 months ago stating "it didn't do any good". He does not have lymphedema pumps, he does not have custom fit compression wrap/stockings. He is diabetic and his recent A1c last month was 7.6. He admits to chronic bilateral lower extremity pain, no change in pain since blister and ulceration development. He is currently being treated with Levaquin for  bronchitis. He has home health and we will continue. 10/25/17-He is here in follow-up evaluation for bilateral lower extremity ulcerationssubtle he remains on Levaquin for bronchitis. Right lower extremity with no evidence of drainage or ulceration, persistent left lower extremity ulceration. He states that home health has not been out since his appointment. [facility], studies revealed:  RIGHT ABI 0.9, TBI 0.6 LEFT ABI 1.1, TBI 0.6 with triphasic flow bilaterally. We will continue his same treatment plan. He has been educated on compression therapy and need for elevation. He will benefit from lymphedema pumps 11/01/17-He is here in follow-up evaluation for left lower extremity ulcer. The right lower extremity remains healed. He has home health services, but they have not been out to see the patient for 2-3 weeks. He states it home health physical therapy changed his dressing yesterday after therapy; he placed Ace wrap compression. We are still waiting for lymphedema pumps, reordered d/t need for company change. 11/08/17-He is here in follow-up evaluation for left lower extremity ulcer. It is improved. Edema is significantly improved with compression therapy. We will continue with same treatment plan and he will follow-up next week. No word regarding lymphedema pumps 11/15/17-He is here in follow-up evaluation for left lower extremity ulcer. He is healed and will be discharged from wound care services. I have reached out to [facility] regarding his lymphedema pumps. They have been unable to reach the patient; the contact number they had with the patient's wife's cell phone and she has not answered any unrecognized calls. Contact should be made today, trial planned for next week; [facility] will continue to follow 11/27/17 on evaluation today patient has multiple blistered areas over the right lower extremity his left lower extremity appears to be doing  okay. These blistered areas show signs of no infection which is great news. With that being said he did have some necrotic skin overlying which was mechanically debrided away with saline and gauze today without complication. Overall post debridement the wounds appear to be doing better but in general his swelling seems to be increased. This is obviously not good news. I think this is what has given rise to the blisters. 12/04/17 on evaluation today patient presents for follow-up concerning his bilateral lower extremity edema in the right lower extremity ulcers. He has been tolerating the dressing changes without complication. With that being said he has had no real issues with the wraps which is also good news. Overall I'm pleased with the progress he's been making. 12/11/17 on evaluation today patient appears to be doing rather well in regard to his right lateral lower extremity ulcer. He's been tolerating the dressing changes without complication. Fortunately there does not appear to be any evidence of infection at this time. Overall I'm pleased with the progress that is being made. Unfortunately he has been in the hospital due to having what sounds to be a stomach virus/flu fortunately that is starting to get better. 12/18/17 on evaluation today patient actually appears to be doing very well in regard to his bilateral lower extremities the swelling is under fairly good control his lymphedema pumps are still not up and running quite yet. With that being said he does have several areas of opening noted as far as wounds are concerned mainly over the left lower extremity. With that being said I do believe once he gets lymphedema pumps this would least help you mention some the fluid and preventing this from occurring. Hopefully that will be set up soon sleeves are Artie in place at his home he just waiting for the machine. 12/25/17 on evaluation today patient actually appears to be doing excellent in fact  all of his ulcers appear to have resolved his legs appear very well. I do think he needs compression stockings we have discussed this and they are actually going to go to  [place] today to elastic therapy to get this fitted for him. I think that is definitely a good thing to do. Readmission: 04/09/18 upon evaluation today this patient is seen for readmission due to bilateral lower extremity lymphedema. He has significant swelling of his extremities especially on the left although the right is also swollen he has weeping from both sides. There are no obvious open wounds at this point. Fortunately he has been doing fairly well for quite a bit of time since I last saw him. Nonetheless unfortunately this seems to have reopened and is giving quite a bit of trouble. He states this began about a week ago when he first called [place] to get in to be seen. No fevers, chills, nausea, or vomiting noted at this time. He has not been using his lymphedema pumps due to the fact that they won't fit on his leg at this point likewise is also not been using his compression for essentially the same reason. 04/16/18 upon evaluation today patient actually appears to be doing a little better in regard to the fluid in his bilateral lower extremities. With that being said he's had three falls since I saw him last week. He also states that he's been feeling very poorly. I was concerned last week and feel like that the concern is still there as far as the congestion in his chest is concerned he seems to be breathing about the same as last week but again he states he's very weak he's not even able to walk further than from the chair to the door. His wife had to buy a wheelchair just to be able to get them out of the house to get to the appointment today. This has me very concerned. 04/23/18 on evaluation today patient actually appears to be doing much better than last week's evaluation. At that time actually had to transport him to  the ER via EMS and he subsequently was admitted for acute pulmonary edema, acute renal failure, and acute congestive heart failure. Fortunately he is doing much better. Apparently they did dialyze him and were able to take off roughly 35 pounds of fluid. Nonetheless he is feeling much better both in regard to his breathing and he's able to get around much better at this time compared to previous. Overall I'm very EZIO, WIECK ([id number]) happy with how things are at this time. There does not appear to be any evidence of infection currently. No fevers, chills, nausea, or vomiting noted at this time. 04/30/2018 patient seen today for follow-up and management of bilateral lower extremity lymphedema. He did express being more sad today than usual due to the recent loss of his dog. He states that he has been compliant with using the lymphedema pumps. However he does admit a minute over the last 2-3 days he has not been using the pumps due to the recent loss of his dog. At this time there is no drainage or open wounds to his lower extremities. The left leg edema is measuring smaller today. Still has a significant amount of edema on bilateral lower extremities With dry flaky skin. He will be referred to the lymphedema clinic for further management. Will continue 3 layer compression wraps and follow-up in 1-2 weeks.Denies any pain, fever, chairs recently. No recent falls or injuries reported during this visit. 05/07/18 on evaluation today patient actually appears to be doing very well in regard to his lower extremities in general all things considered. With that being said he is having  some pain in the legs just due to the amount of swelling. He does have an area where he had a blister on the left lateral lower extremity this is open at this point other than that there's nothing else weeping at this time. 05/14/18 on evaluation today patient actually appears to be doing excellent all things considered in  regard to his lower extremities. He still has a couple areas of weeping on each leg which has continued to be the issue for him. He does have an appointment with the lymphedema clinic although this isn't until February 2020. That was the earliest they had. In the meantime he has continued to tolerate the compression wraps without complication. 05/28/18 on evaluation today patient actually appears to be doing more poorly in regard to his left lower extremity where he has a wound open at this point. He also had a fall where he subsequently injured his right great toe which has led to an open wound at the site unfortunately. He has been tolerating the dressing changes without complication in general as far as the wraps are concerned that he has not been putting any dressing on the left 1st toe ulcer site. 06/11/18 on evaluation today patient appears to be doing much worse in regard to his bilateral lower extremity ulcers. He has been tolerating the dressing changes without complication although his legs have not been wrapped more recently. Overall I am not very pleased with the way his legs appear. I do believe he needs to be back in compression wraps he still has not received his compression wraps from the [facility] hospital as of yet. 06/18/18 on evaluation today patient actually appears to be doing significantly better than last time I saw him. He has been tolerating the compression wraps without complication in the circumferential ulcers especially appear to be doing much better. His toe ulcer on the right in regard to the great toe is better although not as good as the legs in my pinion. No fevers chills noted 07/02/18 on evaluation today patient appears to be doing much better in regard to his lower extremity ulcers. Unfortunately since I last saw him he's had the distal portion of his right great toe if he dated it sounds as if this actually went downhill very quickly. I had only seen him a few days prior  and the toe did not appear to be infected at that point subsequently became infected very rapidly and it was decided by the surgeon that the distal portion of the toe needed to be removed. The patient seems to be doing well in this regard he tells me. With that being said his lower extremities are doing better from the standpoint of the wounds although he is significantly swollen at this point. 07/09/18 on evaluation today patient appears to be doing better in regard to the wounds on his lower extremities. In fact everything is almost completely healed he is just a small area on the left posterior lower extremity that is open at this point. 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 With that being said he needs some kind of compression it does sound like he may not be wearing his compression, that is the wraps, during the entire time between when he's here visit to visit. Apparently his wife took the current one off because  it began to "fall apart". 07/16/18 on evaluation today patient appears to be extremely swollen especially in regard to his left lower extremity unfortunately. He also has a new skin tear over the left lower extremity and there's a smaller area on the right lower extremity as well. Unfortunately this seems to be due in part to blistering and fluid buildup in his leg. He did get the reduction wraps that were ordered by the [facility] hospital for him to go to lymphedema clinic. With that being said his wounds on the legs have not healed to the point to where they would likely accept them as a patient lymphedema clinic currently. We need to try to get this to heal. With that being said he's been taking his wraps off which is not doing him any favors at this point. In fact this is probably quite counterproductive  compared to what needs to occur. We will likely need to increase to a four layer compression wrap and continue to also utilize elevation and he has to keep the wraps on not take them off as he's been doing currently hasn't had a wrap on since Saturday. 07/23/18 on evaluation today patient appears to be doing much better in regard to his bilateral lower extremities. In fact his left lower extremity which was the largest is actually 15 cm smaller today compared to what it was last time he was here in our clinic. This is obviously good news after just one week. Nonetheless the differences he actually kept the wraps on during the entire week this time. That's not typical for him. I do believe he understands a little bit better now the severity of the situation and why it's important for him to keep these wraps on. 07/30/18 on evaluation today patient actually appears to be doing rather well in regard to his lower extremities. His legs are much smaller than they have been in the past and he actually has only one very small rudder superficial region remaining that is not closed on the left lateral/posterior lower extremity even this is almost completely close. I do believe likely next week he will be healed without any complications. I do think we need to continue the wraps however this seems to be beneficial for him. I also think it may be a good time for [place] to go ahead and see about getting the appointment with the lymphedema clinic which is supposed to be made for him in order to keep this moving along and hopefully get them into compression wraps that will in the end help him to remain healed. 08/06/18 on evaluation today patient actually appears to be doing very well in regard as bilateral lower extremities. In fact his wounds appear to be completely healed at this time. 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 He is in the process of getting  appointment with the lymphedema clinic we have made this referral were just waiting to hear back on the schedule time. We need to follow up on that today as well. 08/13/18 on evaluation today patient actually appears to be doing very well in regard to his bilateral lower extremities there are no open wounds at this point. We are gonna go ahead and see about ordering the Velcro compression wraps for him had a discussion with them about [place] doing it versus the [place] they feel like they can definitely afford going ahead and get the wraps themselves and they would prefer to try  to avoid having to go to the lymphedema clinic if it all possible which I completely understand. As long as he has good compression I'm okay either way. 3/17//20 on evaluation today patient actually appears to be doing well in regard to his bilateral lower extremity ulcers. He has been tolerating the dressing changes without complication specifically the compression wraps. Overall is had no issues my fingers finding that I see at this point is that he is having trouble with constipation. He tells me he has not been able to go to the bathroom for about six days. He's taken to over-the- counter oral laxatives unfortunately this is not helping. He has not contacted his doctor. ISSAAC, SHIPPER ([id number]) 08/27/18 on evaluation today patient appears to be doing fairly well in regard to his lower extremities at this point. There does not appear to be any new altars is swelling is very well controlled. We are still waiting for his Velcro compression wraps to arrive that should be sometime in the next week is Artie sent the check for these. 09/03/18 on evaluation today patient appears to be doing excellent in regard to his bilateral lower extremities which he shows no signs of wound openings in regard to this point. He does have his Velcro compression wraps which did arrive in the mail since I saw him last week. Overall he is doing excellent in  my pinion. 09/13/18 on evaluation today patient appears to be doing very well currently in regard to the overall appearance of his bilateral lower extremities although he's a little bit more swollen than last time we saw him. At that point he been discharged without any open wounds. Nonetheless he has a small open wound on the posterior left lower extremity with some evidence of cellulitis noted as well. Fortunately I feel like he has made good progress overall with regard to his lower extremities from were things used to be. 09/20/18 on evaluation today patient actually appears to be doing much better. The erythematous lower extremity is improving wound itself which is still open appears to be doing much better as far as but appearance as well as pain is concerned overall very pleased in this regard. There's no signs of active infection at this time. 09/27/18 on evaluation today patient's wounds on the lower extremity actually appear to be doing fairly well at this time which is good news. There is no evidence of active infection currently and again is just as left lower extremity were there any wounds at this point anyway. I believe they may be completely healed but again I'm not 100% sure based on evaluation today. I think one more week of observation would likely be a good idea. 10/04/18 on evaluation today patient actually appears to be doing excellent in regard to the left lower extremity which actually appears to be completely healed as of today. Unfortunately he's been having issues with his right lower extremity have a new wound that has opened. Fortunately there's no evidence of active infection at this time which is good news. 10/10/18 upon evaluation today patient actually appears to be doing a little bit worse with the new open area on his right posterior lower extremity. He's been tolerating the dressing changes without complication. Right now we been using his compression wraps although I  think we may need to switch back to actually performing bilateral compression wraps are in the clinic. No fevers, chills, nausea, or vomiting noted at this time. 10/17/18 on evaluation today patient actually appears  to be doing quite well in regard to his bilateral lower extremity ulcers. He has been tolerating the reps without complication although he would prefer not to rewrap his legs as of today. Fortunately there's no signs of active infection which is good news. No fevers, chills, nausea, or vomiting noted at this time. 10/24/18 on evaluation today patient appears to be doing very well in regard to his lower extremities. His right lower extremity is shown signs of healing and his left lower [place] though not healed appears to be improving which is excellent news. Overall very pleased with how things seem to be progressing at this time. The patient is likewise happy to hear this. His Velcro compression wraps however have not been put on properly will gonna show his wife how to do that properly today. 10/31/18 on evaluation today patient appears to be doing more poorly in regard to his left lower extremity in particular although both lower extremities actually are showing some signs of being worse than my previous evaluation. Unfortunately I'm just not sure that his compression stockings even with the use of the compression/lymphedema pumps seem to be controlling this well. Upon further questioning he tells me that he also is not able to lie flat in the bed due to his congestive heart failure and difficulty breathing. For that reason he sleeps in his recliner. To make matters worse his recliner also cannot even hold his legs up so instead of even being somewhat elevated they pretty much hang down to the floor. This is the way he sleeps each night which is definitely counterproductive to everything else that were attempting to do from the standpoint of controlling his fluid. Nonetheless I think that  he potentially could benefit from a hospital bed although this would be something that his primary care provider would likely have to order since anything that is order on our side has to be directly related to wound care and again the hospital bed is not necessarily a direct relation to although I think it does contribute to his overall wound status on his lower extremities. 11/07/18 on evaluation today patient appears to be doing better in regard to his bilateral lower extremity. He's been tolerating the dressing changes without complication. We did in the interim since I last saw him switch to just using extras orbiting new alginate and that seems to have done much better for him. I'm very pleased with the overall progress that is made. 11/14/18 on evaluation today patient appears to be redoing rather well in regard to his left lower extremity ulcers which are the only ones remaining at this point. Fortunately there's no signs of active infection at this time which is good news. Overall been very pleased with how things seem to be progressing currently. No fevers, chills, nausea, or vomiting noted at this time. 11/20/17 on evaluation today patient actually appears to be doing quite well in regard to his lower extremities on the left on the right he has several blisters that showed up although there's some question about whether or not he has had his broker compression wraps on like he was supposed to or not. Fortunately there's no signs of active infection at this time which is good news. Unfortunately though he is doing well from the standpoint of the left leg the right leg is not doing as well again this is when we did not wrap last week. 11/28/18 on evaluation today patient appears to be doing well in regard to his bilateral  lower extremities is left appears to be healed is right is not healed but is very close to being so. Overall very pleased with how things seem to be progressing. Patient is  likewise happy that things are doing well. 12/09/18 on evaluation today patient actually appears to be completely healed which is excellent news. He actually seems to be doing well in regard to the swelling of the bilateral lower extremities which is also great news. Overall very pleased with how things seem to be progressing. Readmission: 03/18/2019 upon evaluation today patient presents for reevaluation concerning issues that he is having with his left lower extremity where he does have a wound noted upon inspection today. He also has a wound on the right second toe on the tip where it is apparently been rubbing in his shoe I did look at issues and you can actually see where this has been occurring as well. Fortunately there is no signs of infection with regard to the toe necessarily although it is very swollen compared to normal that does have me somewhat concerned about the possibility of further evaluation for infection/osteomyelitis. I would recommend an x-ray to start with. Otherwise he states that it is been 1-2 weeks that he has had the draining in regard to left lower extremity he does not know how this happened he has been wearing his compression appropriately and I do see that as well today but nonetheless I do think that he needs to continue to be very cautious with regard to elevation as well. BOWDY, BAIR ([id number]) 03/25/2019 on evaluation today patient appears to be doing much better in regard to his lower extremity at this time. That is on the left. He did culture positive for Pseudomonas but the good news is he seems to be doing much better the gentamicin we applied topically seems to have done a great job for him. There is no signs of active infection at this time systemically and locally he is doing much better. No fevers, chills, nausea, vomiting, or diarrhea. In regard to his right toe this seems to be doing much better there is very little pressure noted at this point I did  clean up some of the callus today around the edges of the wound as well as the surface of the wound but to be honest he is progressing quite nicely. 10/30; the area on the left anterior lower tibia area is healed over. His edema control is adequate even though his use of the compression pumps seems very intermittent. He has a juxta lite stocking on the right leg and a think there is 1 for the left leg at home. His wife is putting these on. He has an area on the plantar right second toe which is a hammertoe they are trying to offload this 04/08/2019 on evaluation today patient appears to be doing well in regard to his lower extremities which is good news. We are seeing him today for his toe ulcer which is giving him a little bit more trouble but again still seems to be doing better at this point which is good news. He is going require some sharp debridement in regard to the toe today however. 04/15/2019 on evaluation today patient actually appears to be doing quite well with regard to his toe ulcer. I am very pleased with how things are doing in that regard. With regard to his lower extremity edema in general he tells me he is not been using the lymphedema pumps regularly although he  does not use them. I think that he needs to use them more regularly he does have a small blister on the left lower extremity this is not open at this point but obviously it something that can get worse if he does not keep the compression going and the pumps going as well. 04/22/2019 on evaluation today patient appears to be doing well with regard to his toe ulcer. He has been tolerating the dressing changes without complication. This is measuring slightly smaller this week as compared to last week. Fortunately there is no signs of active infection at this time. 05/06/2019 on evaluation today patient actually appears to be doing excellent. In fact his toe appears to be completely healed which is great news. There is no signs  of active infection at this time. Readmission: Patient presents today for follow-up after having been discharged at the beginning of December 2020. He states that in recent weeks things have reopened and has been having more trouble at this point. With that being said fortunately there is no signs of active infection at this time. No fever chills noted. Nonetheless he also does have an issue with one of his toes as well that seems to be somewhat this is on the right foot second toe. 07/25/2019 upon evaluation today patient's lower extremities appear to be doing excellent bilaterally. I really feel like he is showing signs of great improvement and in fact I think he is close to healing which is great news. There is no signs of active infection at this time. No fevers, chills, nausea, vomiting, or diarrhea. 07/31/2019 upon evaluation today patient appears to be doing excellent and in fact I think may be completely healed in regard to his right lower extremity that were still to monitor this 1 more week before closing it out. The left lower extremity is measuring smaller although not completely closed seems to be doing excellent. 08/07/2019 upon evaluation today patient appears to be doing excellent in regard to his wounds. In fact the right lower extremity is completely healed there is no issues here. The left lower extremity has 1 very small area still remaining fortunately there is no signs of infection and overall I think he is very close to closure of the here as well. 08/14/19 upon evaluation today patient appears to be doing excellent in regard to his lower extremities. In fact he appears to be completely healed as of today. Fortunately there's no signs of active infection at this time. No fevers, chills, nausea, or vomiting noted at this time. READMISSION 09/26/2019 This is a 76 year old man who is well-known to this clinic having just been discharged on 08/14/2019. He has known lymphedema and  chronic venous insufficiency. He has Farrow wrap stockings and also external compression pumps. He does not use the external compression pumps however. He does however apparently fairly faithfully use the stockings. They have not been lotion in his legs. He has developed new wounds on the right anterior as well as the left medial left lateral and left posterior calf. He returns for our review of this Past medical history includes coronary artery disease, hypertension, congestive heart failure, COPD, venous insufficiency with lymphedema, type 2 diabetes. He apparently has compression pumps, Farrow wraps and at 1 point a hospital bed although I believe he sleeps in a recliner 10/03/2019 upon evaluation today patient appears to be doing better with regard to his wounds in general. He has been tolerating the dressing changes without complication. Fortunately there is no signs  of active infection. No fevers, chills, nausea, vomiting, or diarrhea. I do believe the compression wraps are helping as they always have in the past. 10/10/2019 upon evaluation today patient appears to be doing excellent at this point. His right lower extremity is measuring much better and in fact is completely healed. His left lower extremity is also measuring better though not healed seems to be doing excellent at this point which is great news. Overall I am very pleased with how things appear currently. 10/27/2019 upon evaluation today patient appears to be doing quite well with regard to his wounds currently. He has been tolerating the dressing changes without complication. Fortunately there is no signs of active infection at this time. In fact he is almost completely healed on the left and has just a very small area open and on the right he is completely healed. 11/04/2019 upon evaluation today patient appears to be doing quite well with regard to his wounds. In fact he appears to be quite possibly completely healed on the left  lower extremity now as well the right lower extremity is still doing well. With that being said unfortunately though this may be healed I think it is a very fragile area and I cannot even confirm there is not a very small opening still remaining. I really think he may benefit from 1 additional weeks wrapping before discontinuing. 11/10/2019 upon evaluation today patient appears to be doing well in regard to his original wounds in fact everything that we were treating last week is completely healed on the left. Unfortunately he has a new skin tear just above where the wrap slipped down due to a fall he sustained causing this injury. 11/17/2019 on evaluation today patient appears to be doing better with regard to his wound. He has been tolerating the dressing changes without NASIAH, LEHENBAUER. ([id number]) complication. Fortunately there is no signs of active infection at this time. No fevers, chills, nausea, vomiting, or diarrhea. 11/24/2019 upon evaluation today patient appears to be doing well with regard to his wound. This is making good progress there does not appear to be signs of active infection but overall I do feel like he is headed in the correct direction. Overall he is tolerating the compression wrap as well. 12/02/2019 upon evaluation today patient appears to be doing well with regard to his leg ulcer. In fact this appears to be completely healed and this is the last of the wounds that he had but still the left anterior lower extremity. Fortunately there is no signs of active infection at this time. No fevers, chills, nausea, vomiting, or diarrhea. Readmission: 02/05/2020 on evaluation today patient appears to be doing well with regard to his wound all things considered. He actually tells me that a couple weeks ago he had trouble with his wraps and therefore took him off for a couple of days in order to give his legs a break. It was during this time that he actually developed increased swelling and  edema of his legs and blisters on both legs. That is when he called to make the appointment. Nonetheless since that time he began wearing his compression wraps which are Velcro in the meantime and has done extremely well with this. In fact his leg appears to be under good control as far as edema is concerned today it is nothing like what I am seeing when he has come in for evaluations in the past. They have been using some of the silver alginate at home  which has been beneficial for him. Fortunately there is no signs of active infection at this time. No fevers, chills, nausea, vomiting, or diarrhea. 02/19/2020 on evaluation today patient unfortunately does not appear to be doing quite as well as I would like to see. He has no signs of active infection at this time but unfortunately he is not doing well in regard to the overall appearance of his left leg. He has a couple other areas that are weeping now and the leg is much more swollen. I think that he needs to actually have a compression wrap to try to manage this. 02/26/2020 on evaluation today patient appears to be doing well with regard to his left lower extremity ulcer. Fortunately the swelling is down I do believe the pressure wrap seems to be doing quite well. Fortunately there is no signs of active infection at this time. 03/05/2020 upon evaluation today patient appears to be doing excellent in regard to his legs at this point. Fortunately there is no signs of active infection which is great news. Overall he feels like he is completely healed which is great news. Readmission: 05/20/2020 upon evaluation today patient appears to be doing well with regard to his leg ulcer all things considered. He is actually coming back to see [place] after having had a reopening of the ulcers on his left leg in the past several weeks. He is out of dressing supplies and his wife is been try to take care of this as best she can. 06/10/2020 upon evaluation today patient  unfortunately appears to be doing significantly worse today compared to when I last saw him. He and his wife both tell me that "there has been a lot going on". I did not actually go into detail about everything that has been happening but nonetheless he has not obviously been wearing his compression. He brings them with him today but they were not on. I am not even certain to be honest that he could get those on at this point. Nonetheless I do believe that he is going to require compression wrapping at some point shortly but right now we need to try to get what appears to be infection under control at this time. 06/28/2020 upon evaluation today patient appears to be doing well with regard to 06/28/2020 upon evaluation today patient appears to be doing well pretty well in regard to his left leg which is much better than previous. With that being said in regard to his right leg he does seem to be having some issues with a new wound after he sustained a fall. This fortunately is not too deep but does appear to be something we need to address. Neither deep which is good. 07/05/2020 on evaluation today patient appears to be doing well with regard to his wounds. In fact everything on the left leg appears to be pretty much healed on the right leg this is very close though not completely closed yet. Fortunately there is no evidence of active infection at this time. No fevers, chills, nausea, vomiting, or diarrhea. 07/13/2019 upon evaluation today patient appears to be making good progress which is great news. Overall I am extremely pleased with where things stand today. There does not appear to be any signs of active infection which is great news and overall his left leg appears healed still the right leg though not healed does seem to be making good progress which is excellent news. He is having no pain. 07/19/2020 on evaluation today patient  appears to be doing well with regard to his wound. He has been  tolerating the dressing changes without complication. Fortunately there does not appear to be any signs of active infection at this time. No fevers, chills, nausea, vomiting, or diarrhea. Readmission: 08/20/2020 on evaluation today patient presents for reevaluation here in the clinic concerning issues that he has been having with his bilateral lower extremities. Unfortunately this is an ongoing reoccurring issue. He tells me that he is really not certain exactly what caused the issue although as I discussed this further with him it appears that he has been sitting for the most part and sleeping in his chair with his feet on the ground he is not really elevating at all in fact the chair does not even have a reclining function therefore it is basically just him sitting with his feet on the ground. I think this alone has probably led to his increased swelling he is also having more difficulty walking which means that he is pumping less blood flow through his legs anyway as far as the venous stasis is concerned. All in all I think this is leading to a downward spiral of him not being able to walk very well as his legs are so heavy, they begin to weep, and overall he just does not seem to be doing as well as it was when I last saw him. 08/27/2020 upon evaluation today patient's legs actually are showing signs of improvement which is great news. There does not appear to be any evidence of infection which is also excellent news. I am extremely pleased with where things stand today. No fevers, chills, nausea, vomiting, or diarrhea. 09/03/20 upon evaluation today patient appears to be doing excellent in regard to his wounds currently. He has been tolerating the dressing changes without complication and overall I am extremely pleased with where things stand today. No fevers, chills, nausea, vomiting, or diarrhea. Overall he is healed on the right leg although the left leg not being completely healed still is doing  significantly better. 09/10/2020/8/22 upon evaluation today patient appears to be doing well with regard to his wounds. He has been tolerating the dressing changes without complication. Fortunately he appears to be completely healed. I am very pleased in that regard. As is the patient his right leg is also doing well he did get a new recliner which is helping him keep his feet off the ground which I think is helped he is down 2 cm on the left compared to last week so I think there is some definite improvement here. RYLYN, RANGANATHAN ([id number]) Readmission 12/15/20: Mr. Haris Baack is a 76 year old male with a past medical history of lymphedema, insulin-dependent type 2 diabetes, COPD and congestive heart failure that presents to the clinic for increased drainage from his legs bilaterally, Left greater than right. Patient has lymphedema. He has been seen in our clinic on multiple occasions for this issue. He currently denies signs of infection. He has not been using his lymphedema pumps. 12/23/2020 upon evaluation today patient appears to be doing well with regard to his wounds. He has been tolerating the dressing changes he really does not have any open wounds just weeping areas of the legs bilaterally and fortunately seems to be doing better at this point. 12/28/2020 upon evaluation today patient's wound is actually showing signs of good improvement. There does not appear to be any evidence of infection which is great news and overall I am extremely pleased at this point.  I think is very close to complete resolution. 01/04/2021 upon evaluation today patient actually appears to be completely healed which is great news. I do not see any signs of active infection currently which is also great news. In general I am extremely pleased with where we stand and I do believe that the patient is tolerating the dressing changes without complication. I believe he is ready to go back into his Velcro compression and I did  see that they brought them with him today. Readmission: Patient presents today for follow-up he was last seen August 2. He is having some issues with some mild drainage and leaking from the left leg. This is something that has reopened and again is a recurrent issue for him. Subsequently he does wear his Velcro compression wraps though I think we may want to look into something a little bit better for him I am thinking that we may want to try the juxta fit compression which I think would do much more effective compression treatment overall for him. 04/08/2021 upon evaluation today patient appears to be doing well with regard to his legs. I do feel like he is showing signs of improvement which is great news. No fevers, chills, nausea, vomiting, or diarrhea. 04/15/2021 upon evaluation today patient's wounds actually appear to be very close to complete resolution. He is legs are actually doing well left leg just has a small area of leaking at this point. Overall I think that we are very close to complete resolution based on what I am seeing. Nonetheless he seems to not be feeling too well today I really do not know what exactly is going on here. 04/22/2021 upon evaluation patient appears to be completely healed based on what I see today. I do not see anything open both legs appear to be doing quite well. 05/20/2021 upon evaluation today patient appears to be doing poorly in regard to his legs. He has not been a full month since we last saw him and he has reopened unfortunately. He does not appear to be any signs of active infection at this time which is great news. No fevers, chills, nausea, vomiting, or diarrhea. 12/27; the patient comes in the clinic today with severe bilateral lymphedema very dry skin but paradoxically his wounds are actually healed or should I say not open. He has compression pumps that he does not use. [Once per week] he does not have compression stockings. He comes in today with  a small presumably pressure ulcer on the lateral plantar heel on the right. The patient states he has been complaining about pain in this area for about 2 months although no wound was recognized until our intake nurse was doing her exam 06/16/2021 upon evaluation today patient appears to be doing better in regard to his wounds. He is not completely healed yet especially in regard to the right heel which is still open. Nonetheless I do think that his left leg is almost completely healed the right leg appears to be doing decently well his right heel is what still mainly open currently. 06/30/2021 upon evaluation today patient appears to be doing well with regard to his wound. Has been tolerating the dressing changes without complication. Fortunately I think his legs are doing quite well the heel is also doing better. In general I am very pleased with where we stand today. 07/14/2021 upon evaluation today patient appears to be doing well with regard to his heel ulcer. Fortunately I think that it is showing some  signs of improvement unfortunately there is a little bit of skin overlying the wound surface which is actually causing this to breakdown a little bit to be honest. It looks a little macerated. Nonetheless we need to clean this away. Electronic Signature(s) Signed: 07/14/2021 1:17:15 PM By: Worthy Keeler PA-C Entered By: Worthy Keeler on 07/14/2021 13:17:14 [name] ([id number]) -------------------------------------------------------------------------------- Physical Exam Details Patient Name: [name] Date of Service: 07/14/2021 12:30 PM Medical Record Number: [id number] Patient Account Number: [id number] Date of Birth/Sex: Jan 24, 1946 (75 y.o. M) Treating RN: Carlene Coria Primary Care Provider: [name] Other Clinician: Referring Provider: [name] Treating Provider/Extender: Skipper Cliche in Treatment: 84 Constitutional Well-nourished and well-hydrated in no acute  distress. Respiratory normal breathing without difficulty. Psychiatric this patient is able to make decisions and demonstrates good insight into disease process. Alert and Oriented x 3. pleasant and cooperative. Notes Upon inspection patient's wound bed actually showed signs of good granulation and epithelization at this point. Fortunately there does not appear to be any signs of active infection locally nor systemically which is great news and overall I am extremely pleased with where we stand today. Electronic Signature(s) Signed: 07/14/2021 1:17:31 PM By: Worthy Keeler PA-C Entered By: Worthy Keeler on 07/14/2021 13:17:30 [name] ([id number]) -------------------------------------------------------------------------------- Physician Orders Details Patient Name: [name] Date of Service: 07/14/2021 12:30 PM Medical Record Number: [id number] Patient Account Number: [id number] Date of Birth/Sex: 1945/06/17 (75 y.o. M) Treating RN: Carlene Coria Primary Care Provider: [name] Other Clinician: Referring Provider: [name] Treating Provider/Extender: Skipper Cliche in Treatment: 15 Verbal / Phone Orders: No Diagnosis Coding Follow-up Appointments o Return Appointment in 1 week. o Nurse Visit as needed Bathing/ Shower/ Hygiene o May shower with wound dressing protected with water repellent cover or cast protector. o No tub bath. Anesthetic (Use 'Patient Medications' Section for Anesthetic Order Entry) o Lidocaine applied to wound bed Edema Control - Lymphedema / Segmental Compressive Device / Other o Patient to wear own compression stockings. Remove compression stockings every night before going to bed and put on every morning when getting up. - left and right o Elevate, Exercise Daily and Avoid Standing for Long Periods of Time. o Elevate legs to the level of the heart and pump ankles as often as possible o Elevate leg(s) parallel to the floor  when sitting. o Compression Pump: Use compression pump on left lower extremity for 60 minutes, twice daily. o Compression Pump: Use compression pump on right lower extremity for 60 minutes, twice daily. Wound Treatment Wound #43 - Calcaneus Wound Laterality: Right Cleanser: Soap and Water 1 x Per Week/30 Days Discharge Instructions: Gently cleanse wound with antibacterial soap, rinse and pat dry prior to dressing wounds Topical: Triamcinolone Acetonide Cream, 0.1%, 15 (g) tube 1 x Per Week/30 Days Discharge Instructions: Apply as directed by provider. Primary Dressing: Silvercel Small 2x2 (in/in) 1 x Per Week/30 Days Discharge Instructions: Apply Silvercel Small 2x2 (in/in) as instructed Secondary Dressing: ABD Pad 5x9 (in/in) 1 x Per Week/30 Days Discharge Instructions: Cover with ABD pad Secondary Dressing: Kerlix 4.5 x 4.1 (in/yd) 1 x Per Week/30 Days Discharge Instructions: Apply Kerlix 4.5 x 4.1 (in/yd) as instructed Secured With: 32M Medipore H Soft Cloth Surgical Tape, 2x2 (in/yd) 1 x Per Week/30 Days Electronic Signature(s) Signed: 07/14/2021 5:14:54 PM By: Worthy Keeler PA-C Signed: 07/15/2021 9:21:59 AM By: Carlene Coria RN Entered By: Carlene Coria on 07/14/2021 13:10:58 [name] ([id number]) -------------------------------------------------------------------------------- Problem List  Details Patient Name: DODD, SCHMID Date of Service: 07/14/2021 12:30 PM Medical Record Number: [id number] Patient Account Number: [id number] Date of Birth/Sex: January 16, 1946 (75 y.o. M) Treating RN: Carlene Coria Primary Care Provider: [name] Other Clinician: Referring Provider: [name] Treating Provider/Extender: Skipper Cliche in Treatment: 15 Active Problems ICD-10 Encounter Code Description Active Date MDM Diagnosis I89.0 Lymphedema, not elsewhere classified 03/25/2021 No Yes I87.2 Venous insufficiency (chronic) (peripheral) 03/25/2021 No Yes L97.812 Non-pressure  chronic ulcer of other part of right lower leg with fat layer 03/25/2021 No Yes exposed L97.822 Non-pressure chronic ulcer of other part of left lower leg with fat layer 03/25/2021 No Yes exposed L89.612 Pressure ulcer of right heel, stage 2 05/31/2021 No Yes Inactive Problems Resolved Problems Electronic Signature(s) Signed: 07/14/2021 1:17:00 PM By: Worthy Keeler PA-C Entered By: Worthy Keeler on 07/14/2021 13:17:00 [name] ([id number]) -------------------------------------------------------------------------------- Progress Note Details Patient Name: [name] Date of Service: 07/14/2021 12:30 PM Medical Record Number: [id number] Patient Account Number: [id number] Date of Birth/Sex: 26-Nov-1945 (75 y.o. M) Treating RN: Carlene Coria Primary Care Provider: [name] Other Clinician: Referring Provider: [name] Treating Provider/Extender: Skipper Cliche in Treatment: 15 Subjective Chief Complaint Information obtained from Patient Bilateral LE edema and weeping ulcers History of Present Illness (HPI) 10/18/17-He is here for initial evaluation of bilateral lower extremity ulcerations in the presence of venous insufficiency and lymphedema. He has been seen by vascular medicine in the past, Dr. Lucky Cowboy, last seen in 2016. He does have a history of abnormal ABIs, which is to be expected given his lymphedema and venous insufficiency. According to Epic, it appears that all attempts for arterial evaluation and/or angiography were not follow through with by patient. He does have a history of being seen in lymphedema clinic in 2018, stopped going approximately 6 months ago stating "it didn't do any good". He does not have lymphedema pumps, he does not have custom fit compression wrap/stockings. He is diabetic and his recent A1c last month was 7.6. He admits to chronic bilateral lower extremity pain, no change in pain since blister and ulceration development. He is currently being  treated with Levaquin for bronchitis. He has home health and we will continue. 10/25/17-He is here in follow-up evaluation for bilateral lower extremity ulcerationssubtle he remains on Levaquin for bronchitis. Right lower extremity with no evidence of drainage or ulceration, persistent left lower extremity ulceration. He states that home health has not been out since his appointment. He went to [facility] on Tuesday, studies revealed: RIGHT ABI 0.9, TBI 0.6 LEFT ABI 1.1, TBI 0.6 with triphasic flow bilaterally. We will continue his same treatment plan. He has been educated on compression therapy and need for elevation. He will benefit from lymphedema pumps 11/01/17-He is here in follow-up evaluation for left lower extremity ulcer. The right lower extremity remains healed. He has home health services, but they have not been out to see the patient for 2-3 weeks. He states it home health physical therapy changed his dressing yesterday after therapy; he placed Ace wrap compression. We are still waiting for lymphedema pumps, reordered d/t need for company change. 11/08/17-He is here in follow-up evaluation for left lower extremity ulcer. It is improved. Edema is significantly improved with compression therapy. We will continue with same treatment plan and he will follow-up next week. No word regarding lymphedema pumps 11/15/17-He is here in follow-up evaluation for left lower extremity ulcer. He is healed and will be discharged from wound care  services. I have reached out to [facility] regarding his lymphedema pumps. They have been unable to reach the patient; the contact number they had with the patient's wife's cell phone and she has not answered any unrecognized calls. Contact should be made today, trial planned for next week; [facility] will continue to follow 11/27/17 on evaluation today patient has multiple blistered areas over the right lower extremity his left lower  extremity appears to be doing okay. These blistered areas show signs of no infection which is great news. With that being said he did have some necrotic skin overlying which was mechanically debrided away with saline and gauze today without complication. Overall post debridement the wounds appear to be doing better but in general his swelling seems to be increased. This is obviously not good news. I think this is what has given rise to the blisters. 12/04/17 on evaluation today patient presents for follow-up concerning his bilateral lower extremity edema in the right lower extremity ulcers. He has been tolerating the dressing changes without complication. With that being said he has had no real issues with the wraps which is also good news. Overall I'm pleased with the progress he's been making. 12/11/17 on evaluation today patient appears to be doing rather well in regard to his right lateral lower extremity ulcer. He's been tolerating the dressing changes without complication. Fortunately there does not appear to be any evidence of infection at this time. Overall I'm pleased with the progress that is being made. Unfortunately he has been in the hospital due to having what sounds to be a stomach virus/flu fortunately that is starting to get better. 12/18/17 on evaluation today patient actually appears to be doing very well in regard to his bilateral lower extremities the swelling is under fairly good control his lymphedema pumps are still not up and running quite yet. With that being said he does have several areas of opening noted as far as wounds are concerned mainly over the left lower extremity. With that being said I do believe once he gets lymphedema pumps this would least help you mention some the fluid and preventing this from occurring. Hopefully that will be set up soon sleeves are Artie in place at his home he just waiting for the machine. 12/25/17 on evaluation today patient actually appears  to be doing excellent in fact all of his ulcers appear to have resolved his legs appear very well. I do think he needs compression stockings we have discussed this and they are actually going to go to [place] today to elastic therapy to get this fitted for him. I think that is definitely a good thing to do. Readmission: 04/09/18 upon evaluation today this patient is seen for readmission due to bilateral lower extremity lymphedema. He has significant swelling of his extremities especially on the left although the right is also swollen he has weeping from both sides. There are no obvious open wounds at this point. Fortunately he has been doing fairly well for quite a bit of time since I last saw him. Nonetheless unfortunately this seems to have reopened and is giving quite a bit of trouble. He states this began about a week ago when he first called [place] to get in to be seen. No fevers, chills, nausea, or vomiting noted at this time. He has not been using his lymphedema pumps due to the fact that they won't fit on his leg at this point likewise is also not been using his compression for essentially  the same reason. 04/16/18 upon evaluation today patient actually appears to be doing a little better in regard to the fluid in his bilateral lower extremities. With that being said he's had three falls since I saw him last week. He also states that he's been feeling very poorly. I was concerned last week and feel like that the concern is still there as far as the congestion in his chest is concerned he seems to be breathing about the same as last week but again he states he's very weak he's not even able to walk further than from the chair to the door. His wife had to buy a wheelchair just to be able to get them out of the house to get to the appointment today. This has me very concerned. DAVONTE, SIEBENALER ([id number]) 04/23/18 on evaluation today patient actually appears to be doing much better than last week's  evaluation. At that time actually had to transport him to the ER via EMS and he subsequently was admitted for acute pulmonary edema, acute renal failure, and acute congestive heart failure. Fortunately he is doing much better. Apparently they did dialyze him and were able to take off roughly 35 pounds of fluid. Nonetheless he is feeling much better both in regard to his breathing and he's able to get around much better at this time compared to previous. Overall I'm very happy with how things are at this time. There does not appear to be any evidence of infection currently. No fevers, chills, nausea, or vomiting noted at this time. 04/30/2018 patient seen today for follow-up and management of bilateral lower extremity lymphedema. He did express being more sad today than usual due to the recent loss of his dog. He states that he has been compliant with using the lymphedema pumps. However he does admit a minute over the last 2-3 days he has not been using the pumps due to the recent loss of his dog. At this time there is no drainage or open wounds to his lower extremities. The left leg edema is measuring smaller today. Still has a significant amount of edema on bilateral lower extremities With dry flaky skin. He will be referred to the lymphedema clinic for further management. Will continue 3 layer compression wraps and follow-up in 1-2 weeks.Denies any pain, fever, chairs recently. No recent falls or injuries reported during this visit. 05/07/18 on evaluation today patient actually appears to be doing very well in regard to his lower extremities in general all things considered. With that being said he is having some pain in the legs just due to the amount of swelling. He does have an area where he had a blister on the left lateral lower extremity this is open at this point other than that there's nothing else weeping at this time. 05/14/18 on evaluation today patient actually appears to be doing  excellent all things considered in regard to his lower extremities. He still has a couple areas of weeping on each leg which has continued to be the issue for him. He does have an appointment with the lymphedema clinic although this isn't until February 2020. That was the earliest they had. In the meantime he has continued to tolerate the compression wraps without complication. 05/28/18 on evaluation today patient actually appears to be doing more poorly in regard to his left lower extremity where he has a wound open at this point. He also had a fall where he subsequently injured his right great toe which has led to  an open wound at the site unfortunately. He has been tolerating the dressing changes without complication in general as far as the wraps are concerned that he has not been putting any dressing on the left 1st toe ulcer site. 06/11/18 on evaluation today patient appears to be doing much worse in regard to his bilateral lower extremity ulcers. He has been tolerating the dressing changes without complication although his legs have not been wrapped more recently. Overall I am not very pleased with the way his legs appear. I do believe he needs to be back in compression wraps he still has not received his compression wraps from the [facility] hospital as of yet. 06/18/18 on evaluation today patient actually appears to be doing significantly better than last time I saw him. He has been tolerating the compression wraps without complication in the circumferential ulcers especially appear to be doing much better. His toe ulcer on the right in regard to the great toe is better although not as good as the legs in my pinion. No fevers chills noted 07/02/18 on evaluation today patient appears to be doing much better in regard to his lower extremity ulcers. Unfortunately since I last saw him he's had the distal portion of his right great toe if he dated it sounds as if this actually went downhill very quickly. I  had only seen him a few days prior and the toe did not appear to be infected at that point subsequently became infected very rapidly and it was decided by the surgeon that the distal portion of the toe needed to be removed. The patient seems to be doing well in this regard he tells me. With that being said his lower extremities are doing better from the standpoint of the wounds although he is significantly swollen at this point. 07/09/18 on evaluation today patient appears to be doing better in regard to the wounds on his lower extremities. In fact everything is almost completely healed he is just a small area on the left posterior lower extremity that is open at this point. 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 With that being said he needs some kind of compression it does sound like he may not be wearing his compression, that is the wraps, during the entire time between when he's here visit to visit. Apparently his wife took the current one off because it began to "fall apart". 07/16/18 on evaluation today patient appears to be extremely swollen especially in regard to his left lower extremity unfortunately. He also has a new skin tear over the left lower extremity and there's a smaller area on the right lower extremity as well. Unfortunately this seems to be due in part to blistering and fluid buildup in his leg. He did get the reduction wraps that were ordered by the [facility] hospital for him to go to lymphedema clinic. With that being said his wounds on the legs have not healed to the point to where they would likely accept them as a patient lymphedema clinic currently. We need to try to get this to heal. With that being said he's been taking his wraps off which is not doing him any favors at this point. In fact this  is probably quite counterproductive compared to what needs to occur. We will likely need to increase to a four  layer compression wrap and continue to also utilize elevation and he has to keep the wraps on not take them off as he's been doing currently hasn't had a wrap on since Saturday. 07/23/18 on evaluation today patient appears to be doing much better in regard to his bilateral lower extremities. In fact his left lower extremity which was the largest is actually 15 cm smaller today compared to what it was last time he was here in our clinic. This is obviously good news after just one week. Nonetheless the differences he actually kept the wraps on during the entire week this time. That's not typical for him. I do believe he understands a little bit better now the severity of the situation and why it's important for him to keep these wraps on. 07/30/18 on evaluation today patient actually appears to be doing rather well in regard to his lower extremities. His legs are much smaller than they have been in the past and he actually has only one very small rudder superficial region remaining that is not closed on the left lateral/posterior lower extremity even this is almost completely close. I do believe likely next week he will be healed without any complications. I do think we need to continue the wraps however this seems to be beneficial for him. I also think it may be a good time for [place] to go ahead and see about getting the appointment with the lymphedema clinic which is supposed to be made for him in order to keep this moving along and hopefully get them into compression wraps that will in the end help him to remain healed. 08/06/18 on evaluation today patient actually appears to be doing very well in regard as bilateral lower extremities. In fact his wounds appear to be completely healed at this time. 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  He is in the process of getting appointment with the lymphedema clinic we have made this referral were just waiting to hear back on the schedule time. We need to follow up on that today as well. 08/13/18 on evaluation today patient actually appears to be doing very well in regard to his bilateral lower extremities there are no open wounds at this point. We are gonna go ahead and see about ordering the Velcro compression wraps for him had a discussion with them about [place] doing it versus the [place] they feel like they can definitely afford going ahead and get the wraps themselves and they would prefer to try to avoid having to go to the lymphedema clinic if it all possible which I completely understand. As long as he has good compression I'm okay either way. PHILLIPS, GOULETTE ([id number]) 3/17//20 on evaluation today patient actually appears to be doing well in regard to his bilateral lower extremity ulcers. He has been tolerating the dressing changes without complication specifically the compression wraps. Overall is had no issues my fingers finding that I see at this point is that he is having trouble with constipation. He tells me he has not been able to go to the bathroom for about six days. He's taken to over-the- counter oral laxatives unfortunately this is not helping. He has not contacted his doctor. 08/27/18 on evaluation today patient appears to be doing fairly well in regard to his lower extremities at this point. There does not appear to be any new altars is swelling is very well controlled. We are still waiting for his Velcro compression  wraps to arrive that should be sometime in the next week is Artie sent the check for these. 09/03/18 on evaluation today patient appears to be doing excellent in regard to his bilateral lower extremities which he shows no signs of wound openings in regard to this point. He does have his Velcro compression wraps which did arrive in the mail since I saw him last week.  Overall he is doing excellent in my pinion. 09/13/18 on evaluation today patient appears to be doing very well currently in regard to the overall appearance of his bilateral lower extremities although he's a little bit more swollen than last time we saw him. At that point he been discharged without any open wounds. Nonetheless he has a small open wound on the posterior left lower extremity with some evidence of cellulitis noted as well. Fortunately I feel like he has made good progress overall with regard to his lower extremities from were things used to be. 09/20/18 on evaluation today patient actually appears to be doing much better. The erythematous lower extremity is improving wound itself which is still open appears to be doing much better as far as but appearance as well as pain is concerned overall very pleased in this regard. There's no signs of active infection at this time. 09/27/18 on evaluation today patient's wounds on the lower extremity actually appear to be doing fairly well at this time which is good news. There is no evidence of active infection currently and again is just as left lower extremity were there any wounds at this point anyway. I believe they may be completely healed but again I'm not 100% sure based on evaluation today. I think one more week of observation would likely be a good idea. 10/04/18 on evaluation today patient actually appears to be doing excellent in regard to the left lower extremity which actually appears to be completely healed as of today. Unfortunately he's been having issues with his right lower extremity have a new wound that has opened. Fortunately there's no evidence of active infection at this time which is good news. 10/10/18 upon evaluation today patient actually appears to be doing a little bit worse with the new open area on his right posterior lower extremity. He's been tolerating the dressing changes without complication. Right now we been using  his compression wraps although I think we may need to switch back to actually performing bilateral compression wraps are in the clinic. No fevers, chills, nausea, or vomiting noted at this time. 10/17/18 on evaluation today patient actually appears to be doing quite well in regard to his bilateral lower extremity ulcers. He has been tolerating the reps without complication although he would prefer not to rewrap his legs as of today. Fortunately there's no signs of active infection which is good news. No fevers, chills, nausea, or vomiting noted at this time. 10/24/18 on evaluation today patient appears to be doing very well in regard to his lower extremities. His right lower extremity is shown signs of healing and his left lower [place] though not healed appears to be improving which is excellent news. Overall very pleased with how things seem to be progressing at this time. The patient is likewise happy to hear this. His Velcro compression wraps however have not been put on properly will gonna show his wife how to do that properly today. 10/31/18 on evaluation today patient appears to be doing more poorly in regard to his left lower extremity in particular although both lower extremities actually  are showing some signs of being worse than my previous evaluation. Unfortunately I'm just not sure that his compression stockings even with the use of the compression/lymphedema pumps seem to be controlling this well. Upon further questioning he tells me that he also is not able to lie flat in the bed due to his congestive heart failure and difficulty breathing. For that reason he sleeps in his recliner. To make matters worse his recliner also cannot even hold his legs up so instead of even being somewhat elevated they pretty much hang down to the floor. This is the way he sleeps each night which is definitely counterproductive to everything else that were attempting to do from the standpoint of controlling his  fluid. Nonetheless I think that he potentially could benefit from a hospital bed although this would be something that his primary care provider would likely have to order since anything that is order on our side has to be directly related to wound care and again the hospital bed is not necessarily a direct relation to although I think it does contribute to his overall wound status on his lower extremities. 11/07/18 on evaluation today patient appears to be doing better in regard to his bilateral lower extremity. He's been tolerating the dressing changes without complication. We did in the interim since I last saw him switch to just using extras orbiting new alginate and that seems to have done much better for him. I'm very pleased with the overall progress that is made. 11/14/18 on evaluation today patient appears to be redoing rather well in regard to his left lower extremity ulcers which are the only ones remaining at this point. Fortunately there's no signs of active infection at this time which is good news. Overall been very pleased with how things seem to be progressing currently. No fevers, chills, nausea, or vomiting noted at this time. 11/20/17 on evaluation today patient actually appears to be doing quite well in regard to his lower extremities on the left on the right he has several blisters that showed up although there's some question about whether or not he has had his broker compression wraps on like he was supposed to or not. Fortunately there's no signs of active infection at this time which is good news. Unfortunately though he is doing well from the standpoint of the left leg the right leg is not doing as well again this is when we did not wrap last week. 11/28/18 on evaluation today patient appears to be doing well in regard to his bilateral lower extremities is left appears to be healed is right is not healed but is very close to being so. Overall very pleased with how things seem to  be progressing. Patient is likewise happy that things are doing well. 12/09/18 on evaluation today patient actually appears to be completely healed which is excellent news. He actually seems to be doing well in regard to the swelling of the bilateral lower extremities which is also great news. Overall very pleased with how things seem to be progressing. Readmission: 03/18/2019 upon evaluation today patient presents for reevaluation concerning issues that he is having with his left lower extremity where he does have a wound noted upon inspection today. He also has a wound on the right second toe on the tip where it is apparently been rubbing in his shoe I did look at issues and you can actually see where this has been occurring as well. Fortunately there is no signs of infection with  regard to BARNET, BENAVIDES ([id number]) the toe necessarily although it is very swollen compared to normal that does have me somewhat concerned about the possibility of further evaluation for infection/osteomyelitis. I would recommend an x-ray to start with. Otherwise he states that it is been 1-2 weeks that he has had the draining in regard to left lower extremity he does not know how this happened he has been wearing his compression appropriately and I do see that as well today but nonetheless I do think that he needs to continue to be very cautious with regard to elevation as well. 03/25/2019 on evaluation today patient appears to be doing much better in regard to his lower extremity at this time. That is on the left. He did culture positive for Pseudomonas but the good news is he seems to be doing much better the gentamicin we applied topically seems to have done a great job for him. There is no signs of active infection at this time systemically and locally he is doing much better. No fevers, chills, nausea, vomiting, or diarrhea. In regard to his right toe this seems to be doing much better there is very little pressure  noted at this point I did clean up some of the callus today around the edges of the wound as well as the surface of the wound but to be honest he is progressing quite nicely. 10/30; the area on the left anterior lower tibia area is healed over. His edema control is adequate even though his use of the compression pumps seems very intermittent. He has a juxta lite stocking on the right leg and a think there is 1 for the left leg at home. His wife is putting these on. He has an area on the plantar right second toe which is a hammertoe they are trying to offload this 04/08/2019 on evaluation today patient appears to be doing well in regard to his lower extremities which is good news. We are seeing him today for his toe ulcer which is giving him a little bit more trouble but again still seems to be doing better at this point which is good news. He is going require some sharp debridement in regard to the toe today however. 04/15/2019 on evaluation today patient actually appears to be doing quite well with regard to his toe ulcer. I am very pleased with how things are doing in that regard. With regard to his lower extremity edema in general he tells me he is not been using the lymphedema pumps regularly although he does not use them. I think that he needs to use them more regularly he does have a small blister on the left lower extremity this is not open at this point but obviously it something that can get worse if he does not keep the compression going and the pumps going as well. 04/22/2019 on evaluation today patient appears to be doing well with regard to his toe ulcer. He has been tolerating the dressing changes without complication. This is measuring slightly smaller this week as compared to last week. Fortunately there is no signs of active infection at this time. 05/06/2019 on evaluation today patient actually appears to be doing excellent. In fact his toe appears to be completely healed which is great  news. There is no signs of active infection at this time. Readmission: Patient presents today for follow-up after having been discharged at the beginning of December 2020. He states that in recent weeks things have reopened and has  been having more trouble at this point. With that being said fortunately there is no signs of active infection at this time. No fever chills noted. Nonetheless he also does have an issue with one of his toes as well that seems to be somewhat this is on the right foot second toe. 07/25/2019 upon evaluation today patient's lower extremities appear to be doing excellent bilaterally. I really feel like he is showing signs of great improvement and in fact I think he is close to healing which is great news. There is no signs of active infection at this time. No fevers, chills, nausea, vomiting, or diarrhea. 07/31/2019 upon evaluation today patient appears to be doing excellent and in fact I think may be completely healed in regard to his right lower extremity that were still to monitor this 1 more week before closing it out. The left lower extremity is measuring smaller although not completely closed seems to be doing excellent. 08/07/2019 upon evaluation today patient appears to be doing excellent in regard to his wounds. In fact the right lower extremity is completely healed there is no issues here. The left lower extremity has 1 very small area still remaining fortunately there is no signs of infection and overall I think he is very close to closure of the here as well. 08/14/19 upon evaluation today patient appears to be doing excellent in regard to his lower extremities. In fact he appears to be completely healed as of today. Fortunately there's no signs of active infection at this time. No fevers, chills, nausea, or vomiting noted at this time. READMISSION 09/26/2019 This is a 76 year old man who is well-known to this clinic having just been discharged on 08/14/2019. He has  known lymphedema and chronic venous insufficiency. He has Farrow wrap stockings and also external compression pumps. He does not use the external compression pumps however. He does however apparently fairly faithfully use the stockings. They have not been lotion in his legs. He has developed new wounds on the right anterior as well as the left medial left lateral and left posterior calf. He returns for our review of this Past medical history includes coronary artery disease, hypertension, congestive heart failure, COPD, venous insufficiency with lymphedema, type 2 diabetes. He apparently has compression pumps, Farrow wraps and at 1 point a hospital bed although I believe he sleeps in a recliner 10/03/2019 upon evaluation today patient appears to be doing better with regard to his wounds in general. He has been tolerating the dressing changes without complication. Fortunately there is no signs of active infection. No fevers, chills, nausea, vomiting, or diarrhea. I do believe the compression wraps are helping as they always have in the past. 10/10/2019 upon evaluation today patient appears to be doing excellent at this point. His right lower extremity is measuring much better and in fact is completely healed. His left lower extremity is also measuring better though not healed seems to be doing excellent at this point which is great news. Overall I am very pleased with how things appear currently. 10/27/2019 upon evaluation today patient appears to be doing quite well with regard to his wounds currently. He has been tolerating the dressing changes without complication. Fortunately there is no signs of active infection at this time. In fact he is almost completely healed on the left and has just a very small area open and on the right he is completely healed. 11/04/2019 upon evaluation today patient appears to be doing quite well with regard to his wounds.  In fact he appears to be quite possibly completely  healed on the left lower extremity now as well the right lower extremity is still doing well. With that being said unfortunately though this may be healed I think it is a very fragile area and I cannot even confirm there is not a very small opening still remaining. I really think he may benefit from 1 additional weeks wrapping before discontinuing. IZRAEL, PEAK ([id number]) 11/10/2019 upon evaluation today patient appears to be doing well in regard to his original wounds in fact everything that we were treating last week is completely healed on the left. Unfortunately he has a new skin tear just above where the wrap slipped down due to a fall he sustained causing this injury. 11/17/2019 on evaluation today patient appears to be doing better with regard to his wound. He has been tolerating the dressing changes without complication. Fortunately there is no signs of active infection at this time. No fevers, chills, nausea, vomiting, or diarrhea. 11/24/2019 upon evaluation today patient appears to be doing well with regard to his wound. This is making good progress there does not appear to be signs of active infection but overall I do feel like he is headed in the correct direction. Overall he is tolerating the compression wrap as well. 12/02/2019 upon evaluation today patient appears to be doing well with regard to his leg ulcer. In fact this appears to be completely healed and this is the last of the wounds that he had but still the left anterior lower extremity. Fortunately there is no signs of active infection at this time. No fevers, chills, nausea, vomiting, or diarrhea. Readmission: 02/05/2020 on evaluation today patient appears to be doing well with regard to his wound all things considered. He actually tells me that a couple weeks ago he had trouble with his wraps and therefore took him off for a couple of days in order to give his legs a break. It was during this time that he actually developed  increased swelling and edema of his legs and blisters on both legs. That is when he called to make the appointment. Nonetheless since that time he began wearing his compression wraps which are Velcro in the meantime and has done extremely well with this. In fact his leg appears to be under good control as far as edema is concerned today it is nothing like what I am seeing when he has come in for evaluations in the past. They have been using some of the silver alginate at home which has been beneficial for him. Fortunately there is no signs of active infection at this time. No fevers, chills, nausea, vomiting, or diarrhea. 02/19/2020 on evaluation today patient unfortunately does not appear to be doing quite as well as I would like to see. He has no signs of active infection at this time but unfortunately he is not doing well in regard to the overall appearance of his left leg. He has a couple other areas that are weeping now and the leg is much more swollen. I think that he needs to actually have a compression wrap to try to manage this. 02/26/2020 on evaluation today patient appears to be doing well with regard to his left lower extremity ulcer. Fortunately the swelling is down I do believe the pressure wrap seems to be doing quite well. Fortunately there is no signs of active infection at this time. 03/05/2020 upon evaluation today patient appears to be doing excellent in regard to  his legs at this point. Fortunately there is no signs of active infection which is great news. Overall he feels like he is completely healed which is great news. Readmission: 05/20/2020 upon evaluation today patient appears to be doing well with regard to his leg ulcer all things considered. He is actually coming back to see [place] after having had a reopening of the ulcers on his left leg in the past several weeks. He is out of dressing supplies and his wife is been try to take care of this as best she can. 06/10/2020 upon  evaluation today patient unfortunately appears to be doing significantly worse today compared to when I last saw him. He and his wife both tell me that "there has been a lot going on". I did not actually go into detail about everything that has been happening but nonetheless he has not obviously been wearing his compression. He brings them with him today but they were not on. I am not even certain to be honest that he could get those on at this point. Nonetheless I do believe that he is going to require compression wrapping at some point shortly but right now we need to try to get what appears to be infection under control at this time. 06/28/2020 upon evaluation today patient appears to be doing well with regard to 06/28/2020 upon evaluation today patient appears to be doing well pretty well in regard to his left leg which is much better than previous. With that being said in regard to his right leg he does seem to be having some issues with a new wound after he sustained a fall. This fortunately is not too deep but does appear to be something we need to address. Neither deep which is good. 07/05/2020 on evaluation today patient appears to be doing well with regard to his wounds. In fact everything on the left leg appears to be pretty much healed on the right leg this is very close though not completely closed yet. Fortunately there is no evidence of active infection at this time. No fevers, chills, nausea, vomiting, or diarrhea. 07/13/2019 upon evaluation today patient appears to be making good progress which is great news. Overall I am extremely pleased with where things stand today. There does not appear to be any signs of active infection which is great news and overall his left leg appears healed still the right leg though not healed does seem to be making good progress which is excellent news. He is having no pain. 07/19/2020 on evaluation today patient appears to be doing well with regard to his  wound. He has been tolerating the dressing changes without complication. Fortunately there does not appear to be any signs of active infection at this time. No fevers, chills, nausea, vomiting, or diarrhea. Readmission: 08/20/2020 on evaluation today patient presents for reevaluation here in the clinic concerning issues that he has been having with his bilateral lower extremities. Unfortunately this is an ongoing reoccurring issue. He tells me that he is really not certain exactly what caused the issue although as I discussed this further with him it appears that he has been sitting for the most part and sleeping in his chair with his feet on the ground he is not really elevating at all in fact the chair does not even have a reclining function therefore it is basically just him sitting with his feet on the ground. I think this alone has probably led to his increased swelling he is also having  more difficulty walking which means that he is pumping less blood flow through his legs anyway as far as the venous stasis is concerned. All in all I think this is leading to a downward spiral of him not being able to walk very well as his legs are so heavy, they begin to weep, and overall he just does not seem to be doing as well as it was when I last saw him. 08/27/2020 upon evaluation today patient's legs actually are showing signs of improvement which is great news. There does not appear to be any evidence of infection which is also excellent news. I am extremely pleased with where things stand today. No fevers, chills, nausea, vomiting, or diarrhea. 09/03/20 upon evaluation today patient appears to be doing excellent in regard to his wounds currently. He has been tolerating the dressing changes without complication and overall I am extremely pleased with where things stand today. No fevers, chills, nausea, vomiting, or diarrhea. Overall he is healed on the right leg although the left leg not being completely  healed still is doing significantly better. DENIEL, MCQUISTON ([id number]) 09/10/2020/8/22 upon evaluation today patient appears to be doing well with regard to his wounds. He has been tolerating the dressing changes without complication. Fortunately he appears to be completely healed. I am very pleased in that regard. As is the patient his right leg is also doing well he did get a new recliner which is helping him keep his feet off the ground which I think is helped he is down 2 cm on the left compared to last week so I think there is some definite improvement here. Readmission 12/15/20: Mr. Kristoff Coonradt is a 76 year old male with a past medical history of lymphedema, insulin-dependent type 2 diabetes, COPD and congestive heart failure that presents to the clinic for increased drainage from his legs bilaterally, Left greater than right. Patient has lymphedema. He has been seen in our clinic on multiple occasions for this issue. He currently denies signs of infection. He has not been using his lymphedema pumps. 12/23/2020 upon evaluation today patient appears to be doing well with regard to his wounds. He has been tolerating the dressing changes he really does not have any open wounds just weeping areas of the legs bilaterally and fortunately seems to be doing better at this point. 12/28/2020 upon evaluation today patient's wound is actually showing signs of good improvement. There does not appear to be any evidence of infection which is great news and overall I am extremely pleased at this point. I think is very close to complete resolution. 01/04/2021 upon evaluation today patient actually appears to be completely healed which is great news. I do not see any signs of active infection currently which is also great news. In general I am extremely pleased with where we stand and I do believe that the patient is tolerating the dressing changes without complication. I believe he is ready to go back into his Velcro  compression and I did see that they brought them with him today. Readmission: Patient presents today for follow-up he was last seen August 2. He is having some issues with some mild drainage and leaking from the left leg. This is something that has reopened and again is a recurrent issue for him. Subsequently he does wear his Velcro compression wraps though I think we may want to look into something a little bit better for him I am thinking that we may want to try the juxta fit compression  which I think would do much more effective compression treatment overall for him. 04/08/2021 upon evaluation today patient appears to be doing well with regard to his legs. I do feel like he is showing signs of improvement which is great news. No fevers, chills, nausea, vomiting, or diarrhea. 04/15/2021 upon evaluation today patient's wounds actually appear to be very close to complete resolution. He is legs are actually doing well left leg just has a small area of leaking at this point. Overall I think that we are very close to complete resolution based on what I am seeing. Nonetheless he seems to not be feeling too well today I really do not know what exactly is going on here. 04/22/2021 upon evaluation patient appears to be completely healed based on what I see today. I do not see anything open both legs appear to be doing quite well. 05/20/2021 upon evaluation today patient appears to be doing poorly in regard to his legs. He has not been a full month since we last saw him and he has reopened unfortunately. He does not appear to be any signs of active infection at this time which is great news. No fevers, chills, nausea, vomiting, or diarrhea. 12/27; the patient comes in the clinic today with severe bilateral lymphedema very dry skin but paradoxically his wounds are actually healed or should I say not open. He has compression pumps that he does not use. [Once per week] he does not have compression  stockings. He comes in today with a small presumably pressure ulcer on the lateral plantar heel on the right. The patient states he has been complaining about pain in this area for about 2 months although no wound was recognized until our intake nurse was doing her exam 06/16/2021 upon evaluation today patient appears to be doing better in regard to his wounds. He is not completely healed yet especially in regard to the right heel which is still open. Nonetheless I do think that his left leg is almost completely healed the right leg appears to be doing decently well his right heel is what still mainly open currently. 06/30/2021 upon evaluation today patient appears to be doing well with regard to his wound. Has been tolerating the dressing changes without complication. Fortunately I think his legs are doing quite well the heel is also doing better. In general I am very pleased with where we stand today. 07/14/2021 upon evaluation today patient appears to be doing well with regard to his heel ulcer. Fortunately I think that it is showing some signs of improvement unfortunately there is a little bit of skin overlying the wound surface which is actually causing this to breakdown a little bit to be honest. It looks a little macerated. Nonetheless we need to clean this away. Objective Constitutional Well-nourished and well-hydrated in no acute distress. Vitals Time Taken: 12:42 PM, Height: 66 in, Weight: 300 lbs, BMI: 48.4, Temperature: 97.9 F, Pulse: 61 bpm, Respiratory Rate: 18 breaths/min, Blood Pressure: 118/70 mmHg. MATVEY, LLANAS ([id number]) Respiratory normal breathing without difficulty. Psychiatric this patient is able to make decisions and demonstrates good insight into disease process. Alert and Oriented x 3. pleasant and cooperative. General Notes: Upon inspection patient's wound bed actually showed signs of good granulation and epithelization at this point. Fortunately there does not  appear to be any signs of active infection locally nor systemically which is great news and overall I am extremely pleased with where we stand today. Integumentary (Hair, Skin) Wound #43 status is  Open. Original cause of wound was Gradually Appeared. The date acquired was: 05/31/2021. The wound has been in treatment 6 weeks. The wound is located on the Right Calcaneus. The wound measures 0.4cm length x 0.4cm width x 0.2cm depth; 0.126cm^2 area and 0.025cm^3 volume. There is Fat Layer (Subcutaneous Tissue) exposed. There is no tunneling noted, however, there is undermining starting at 10:00 and ending at 3:00 with a maximum distance of 0.6cm. There is a medium amount of serosanguineous drainage noted. There is medium (34-66%) pink granulation within the wound bed. There is a medium (34-66%) amount of necrotic tissue within the wound bed including Adherent Slough. 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 There was a Excisional Skin/Subcutaneous Tissue Debridement with a total area of 0.16 sq cm performed by Tommie Sams., PA-C. With the following instrument(s): Curette to remove Viable and Non-Viable tissue/material. Material removed includes Subcutaneous Tissue, Slough, Skin: Dermis, and Skin: Epidermis. No specimens were taken. A time out was conducted at 13:07, prior to the start of the procedure. A Minimum amount of bleeding was controlled with Pressure. The procedure was tolerated well with a pain level of 0 throughout and  a pain level of 0 following the procedure. Post Debridement Measurements: 0.4cm length x 0.4cm width x 0.2cm depth; 0.025cm^3 volume. Character of Wound/Ulcer Post Debridement is improved. Severity of Tissue Post Debridement is: Fat layer exposed. Post procedure Diagnosis Wound #43: Same as Pre-Procedure Plan Follow-up Appointments: Return Appointment in 1 week. Nurse Visit as needed Bathing/ Shower/ Hygiene: May shower with wound dressing protected with water repellent cover or cast protector. No tub bath. Anesthetic (Use 'Patient Medications' Section for Anesthetic Order Entry): Lidocaine applied to wound bed Edema Control - Lymphedema / Segmental Compressive Device / Other: Patient to wear own compression stockings. Remove compression stockings every night before going to bed and put on every morning when getting up. - left and right Elevate, Exercise Daily and Avoid Standing for Long Periods of Time. Elevate legs to the level of the heart and pump ankles as often as possible Elevate leg(s) parallel to the floor when sitting. Compression Pump: Use compression pump on left lower extremity for 60 minutes, twice daily. Compression Pump: Use compression pump on right lower extremity for 60 minutes, twice daily. WOUND #43: - Calcaneus Wound Laterality: Right Cleanser: Soap and Water 1 x Per Week/30 Days CHACE, KLIPPEL ([id number]) Discharge Instructions: Gently cleanse wound with antibacterial soap, rinse and pat dry prior to dressing wounds Topical: Triamcinolone Acetonide Cream, 0.1%, 15 (g) tube 1 x Per Week/30 Days Discharge Instructions: Apply as directed by provider. Primary Dressing: Silvercel Small 2x2 (in/in) 1 x Per Week/30 Days Discharge Instructions: Apply Silvercel Small 2x2 (in/in) as instructed Secondary Dressing: ABD Pad 5x9 (in/in) 1 x Per Week/30 Days Discharge Instructions: Cover with ABD pad Secondary Dressing: Kerlix 4.5 x 4.1 (in/yd) 1 x Per Week/30 Days Discharge  Instructions: Apply Kerlix 4.5 x 4.1 (in/yd) as instructed Secured With: 52M Medipore H Soft Cloth Surgical Tape, 2x2 (in/yd) 1 x Per Week/30 Days 1. I would recommend that we going continue with  the wound care measures as before and the patient is in agreement with plan this includes the use of the silver alginate dressing over the heel which I think is appropriate. 2. Also can recommend an ABD pad and roll gauze to secure in place. 3. I would also suggest that we continue with the [facility] basic wraps to both lower extremities to help with edema control. We will see patient back for reevaluation in 1 week here in the clinic. If anything worsens or changes patient will contact our office for additional recommendations. Electronic Signature(s) Signed: 07/14/2021 1:18:22 PM By: Worthy Keeler PA-C Entered By: Worthy Keeler on 07/14/2021 13:18:21 [name] ([id number]) -------------------------------------------------------------------------------- SuperBill Details Patient Name: [name] Date of Service: 07/14/2021 Medical Record Number: [id number] Patient Account Number: [id number] Date of Birth/Sex: 04/02/1946 (75 y.o. 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 L89.612 Pressure ulcer of right heel, stage 2 Facility Procedures CPT4 Code: 48185631 Description: 11042 - DEB SUBQ TISSUE 20 SQ CM/< Modifier: Quantity: 1 CPT4 Code: Description: ICD-10 Diagnosis Description L89.612 Pressure ulcer of right heel, stage  2 Modifier: Quantity: Physician Procedures CPT4 Code: [id number] Description: 11042 - WC PHYS SUBQ TISS 20 SQ CM Modifier: Quantity: 1 CPT4 Code: Description: ICD-10 Diagnosis Description L89.612 Pressure ulcer of right heel, stage 2 Modifier: Quantity: Electronic Signature(s) Signed: 07/14/2021 1:24:50 PM By: Worthy Keeler PA-C Entered By: Worthy Keeler on 07/14/2021 13:24:47

## 2021-07-21 ENCOUNTER — Other Ambulatory Visit: Payer: Self-pay

## 2021-07-21 DIAGNOSIS — L97822 Non-pressure chronic ulcer of other part of left lower leg with fat layer exposed: Secondary | ICD-10-CM | POA: Diagnosis not present

## 2021-07-21 DIAGNOSIS — I872 Venous insufficiency (chronic) (peripheral): Secondary | ICD-10-CM | POA: Diagnosis not present

## 2021-07-21 DIAGNOSIS — E1140 Type 2 diabetes mellitus with diabetic neuropathy, unspecified: Secondary | ICD-10-CM | POA: Diagnosis not present

## 2021-07-21 DIAGNOSIS — E11621 Type 2 diabetes mellitus with foot ulcer: Secondary | ICD-10-CM | POA: Diagnosis not present

## 2021-07-21 DIAGNOSIS — M1990 Unspecified osteoarthritis, unspecified site: Secondary | ICD-10-CM | POA: Diagnosis not present

## 2021-07-21 DIAGNOSIS — L89612 Pressure ulcer of right heel, stage 2: Secondary | ICD-10-CM | POA: Diagnosis not present

## 2021-07-21 DIAGNOSIS — I890 Lymphedema, not elsewhere classified: Secondary | ICD-10-CM | POA: Diagnosis not present

## 2021-07-21 DIAGNOSIS — E1151 Type 2 diabetes mellitus with diabetic peripheral angiopathy without gangrene: Secondary | ICD-10-CM | POA: Diagnosis not present

## 2021-07-21 DIAGNOSIS — L97812 Non-pressure chronic ulcer of other part of right lower leg with fat layer exposed: Secondary | ICD-10-CM | POA: Diagnosis not present

## 2021-07-21 NOTE — Progress Notes (Signed)
Adam, Merritt ([id number]) Visit Report for 07/21/2021 Arrival Information Details Patient Name: Adam Merritt, Adam Merritt Date of Service: 07/21/2021 12:30 PM Medical Record Number: [id number] Patient Account Number: [id number] Date of Birth/Sex: 1945/07/14 (76 y.o. M) Treating RN: Carlene Coria Primary Care Enedina Pair: [name] Other Clinician: Referring Quinzell Malcomb: [name] Treating Enriqueta Augusta/Extender: Skipper Cliche in Treatment: 84 Visit Information History Since Last Visit All ordered tests and consults were completed: No Patient Arrived: Wheel Chair Added or deleted any medications: No Arrival Time: 12:30 Any new allergies or adverse reactions: No Accompanied By: wife Had a fall or experienced change in No Transfer Assistance: None activities of daily living that may affect Patient Identification Verified: Yes risk of falls: Secondary Verification Process Completed: Yes Signs or symptoms of abuse/neglect since last visito No Patient Requires Transmission-Based Precautions: No Hospitalized since last visit: No Patient Has Alerts: Yes Implantable device outside of the clinic excluding No Patient Alerts: diabetic type 2 cellular tissue based products placed in the center since last visit: Has Dressing in Place as Prescribed: Yes Has Compression in Place as Prescribed: Yes Pain Present Now: No Electronic Signature(s) Signed: 07/21/2021 1:10:44 PM By: Carlene Coria RN Entered By: Carlene Coria on 07/21/2021 13:10:44 Adam Merritt ([id number]) -------------------------------------------------------------------------------- Clinic Level of Care Assessment Details Patient Name: Adam Merritt Date of Service: 07/21/2021 12:30 PM Medical Record Number: [id number] Patient Account Number: [id number] Date of Birth/Sex: 02-12-1946 (76 y.o. M) Treating RN: Carlene Coria Primary Care Sansa Alkema: [name] Other Clinician: Referring Samhita Kretsch: [name] Treating Lorrie Strauch/Extender: Skipper Cliche in Treatment: 16 Clinic Level of Care Assessment Items TOOL 4 Quantity Score X - Use when only an EandM is performed on FOLLOW-UP visit 1 0 ASSESSMENTS - Nursing Assessment / Reassessment X - Reassessment of Co-morbidities (includes updates in patient status) 1 10 X- 1 5 Reassessment of Adherence to Treatment Plan ASSESSMENTS - Wound and Skin Assessment / Reassessment X - Simple Wound Assessment / Reassessment - one wound 1 5 []  - 0 Complex Wound Assessment / Reassessment - multiple wounds []  - 0 Dermatologic / Skin Assessment (not related to wound area) ASSESSMENTS - Focused Assessment []  - Circumferential Edema Measurements - multi extremities 0 []  - 0 Nutritional Assessment / Counseling / Intervention []  - 0 Lower Extremity Assessment (monofilament, tuning fork, pulses) []  - 0 Peripheral Arterial Disease Assessment (using hand held doppler) ASSESSMENTS - Ostomy and/or Continence Assessment and Care []  - Incontinence Assessment and Management 0 []  - 0 Ostomy Care Assessment and Management (repouching, etc.) PROCESS - Coordination of Care X - Simple Patient / Family Education for ongoing care 1 15 []  - 0 Complex (extensive) Patient / Family Education for ongoing care []  - 0 Staff obtains Programmer, systems, Records, Test Results / Process Orders []  - 0 Staff telephones HHA, Nursing Homes / Clarify orders / etc []  - 0 Routine Transfer to another Facility (non-emergent condition) []  - 0 Routine Hospital Admission (non-emergent condition) []  - 0 New Admissions / Biomedical engineer / Ordering NPWT, Apligraf, etc. []  - 0 Emergency Hospital Admission (emergent condition) X- 1 10 Simple Discharge Coordination []  - 0 Complex (extensive) Discharge Coordination PROCESS - Special Needs []  - Pediatric / Minor Patient Management 0 []  - 0 Isolation Patient Management []  - 0 Hearing / Language / Visual special needs []  - 0 Assessment of Community assistance  (transportation, D/C planning, etc.) []  - 0 Additional assistance / Altered mentation []  - 0 Support Surface(s) Assessment (bed, cushion, seat, etc.) INTERVENTIONS - Wound Cleansing /  Measurement KABEER, HOAGLAND ([id number]) X- 1 5 Simple Wound Cleansing - one wound []  - 0 Complex Wound Cleansing - multiple wounds []  - 0 Wound Imaging (photographs - any number of wounds) []  - 0 Wound Tracing (instead of photographs) X- 1 5 Simple Wound Measurement - one wound []  - 0 Complex Wound Measurement - multiple wounds INTERVENTIONS - Wound Dressings X - Small Wound Dressing one or multiple wounds 1 10 []  - 0 Medium Wound Dressing one or multiple wounds []  - 0 Large Wound Dressing one or multiple wounds X- 1 5 Application of Medications - topical []  - 0 Application of Medications - injection INTERVENTIONS - Miscellaneous []  - External ear exam 0 []  - 0 Specimen Collection (cultures, biopsies, blood, body fluids, etc.) []  - 0 Specimen(s) / Culture(s) sent or taken to Lab for analysis []  - 0 Patient Transfer (multiple staff / Civil Service fast streamer / Similar devices) []  - 0 Simple Staple / Suture removal (25 or less) []  - 0 Complex Staple / Suture removal (26 or more) []  - 0 Hypo / Hyperglycemic Management (close monitor of Blood Glucose) []  - 0 Ankle / Brachial Index (ABI) - do not check if billed separately []  - 0 Vital Signs Has the patient been seen at the hospital within the last three years: Yes Total Score: 70 Level Of Care: New/Established - Level 2 Electronic Signature(s) Unsigned Entered ByCarlene Coria on 07/21/2021 13:13:35 Signature(s): Date(s): Adam Merritt ([id number]) -------------------------------------------------------------------------------- Encounter Discharge Information Details Patient Name: Adam, Merritt Date of Service: 07/21/2021 12:30 PM Medical Record Number: [id number] Patient Account Number: [id number] Date of Birth/Sex: 1946/02/07 (76 y.o.  M) Treating RN: Carlene Coria Primary Care Oluwademilade Mckiver: [name] Other Clinician: Referring Agnes Brightbill: [name] Treating Mesa Janus/Extender: Skipper Cliche in Treatment: 16 Encounter Discharge Information Items Discharge Condition: Stable Ambulatory Status: Wheelchair Discharge Destination: Home Transportation: Private Auto Accompanied By: wife Schedule Follow-up Appointment: Yes Clinical Summary of Care: Patient Declined Electronic Signature(s) Signed: 07/21/2021 1:13:04 PM By: Carlene Coria RN Entered By: Carlene Coria on 07/21/2021 13:13:04 Adam Merritt ([id number]) -------------------------------------------------------------------------------- Wound Assessment Details Patient Name: Adam Merritt Date of Service: 07/21/2021 12:30 PM Medical Record Number: [id number] Patient Account Number: [id number] Date of Birth/Sex: [birth year] (75 y.o. M) Treating RN: Carlene Coria Primary Care Lambert Jeanty: [name] Other Clinician: Referring Simrin Vegh: [name] Treating Chessie Neuharth/Extender: Skipper Cliche in Treatment: 16 Wound Status Wound Number: 43 Primary Diabetic Wound/Ulcer of the Lower Extremity Etiology: Wound Location: Right Calcaneus Wound Open Wounding Event: Gradually Appeared Status: Date Acquired: 05/31/2021 Comorbid Anemia, Lymphedema, Chronic Obstructive Pulmonary Weeks Of Treatment: 7 History: Disease (COPD), Sleep Apnea, Congestive Heart Failure, Clustered Wound: No Coronary Artery Disease, Hypertension, Peripheral Venous Disease, Type II Diabetes, Osteoarthritis, Neuropathy, Received Chemotherapy Wound Measurements Length: (cm) 0.4 Width: (cm) 0.4 Depth: (cm) 0.2 Area: (cm) 0.126 Volume: (cm) 0.025 % Reduction in Area: 91.6% % Reduction in Volume: 91.6% Epithelialization: Large (67-100%) Tunneling: No Undermining: No Wound Description Classification: Grade 1 Exudate Amount: Medium Exudate Type: Serosanguineous Exudate Color: red, brown Foul  Odor After Cleansing: No Slough/Fibrino No Wound Bed Granulation Amount: Large (67-100%) Exposed Structure Granulation Quality: Pink Fat Layer (Subcutaneous Tissue) Exposed: Yes Necrotic Amount: None Present (0%) Treatment Notes Wound #43 (Calcaneus) Wound Laterality: Right Cleanser Soap and Water Discharge Instruction: Gently cleanse wound with antibacterial soap, rinse and pat dry prior to dressing wounds Peri-Wound Care Topical Triamcinolone Acetonide Cream, 0.1%, 15 (g) tube Discharge Instruction: Apply as directed by Zain Bingman. Primary Dressing Silvercel Small 2x2 (  in/in) Discharge Instruction: Apply Silvercel Small 2x2 (in/in) as instructed Secondary Dressing ABD Pad 5x9 (in/in) Discharge Instruction: Cover with ABD pad Kerlix 4.5 x 4.1 (in/yd) Discharge Instruction: Apply Kerlix 4.5 x 4.1 (in/yd) as instructed Secured With 38M [place] Surgical Tape, 2x2 (in/yd) DEJION, GRILLO ([id number]) Compression Wrap Compression Stockings Add-Ons Electronic Signature(s) Signed: 07/21/2021 1:11:22 PM By: Carlene Coria RN Entered By: Carlene Coria on 07/21/2021 13:11:21

## 2021-07-21 NOTE — Progress Notes (Signed)
[name], [name] ([id number]) Visit Report for 07/21/2021 Physician Orders Details Patient Name: Adam Merritt, Adam Merritt Date of Service: 07/21/2021 12:30 PM Medical Record Number: [id number] Patient Account Number: [id number] Date of Birth/Sex: 1946/05/15 (75 y.o. M) Treating RN: Carlene Coria Primary Care Provider: [name] Other Clinician: Referring Provider: [name] Treating Provider/Extender: Skipper Cliche in Treatment: [id number] Verbal / Phone Orders: No Diagnosis Coding Follow-up Appointments o Return Appointment in 1 week. o Nurse Visit as needed Bathing/ Shower/ Hygiene o May shower with wound dressing protected with water repellent cover or cast protector. o No tub bath. Anesthetic (Use 'Patient Medications' Section for Anesthetic Order Entry) o Lidocaine applied to wound bed Edema Control - Lymphedema / Segmental Compressive Device / Other o Patient to wear own compression stockings. Remove compression stockings every night before going to bed and put on every morning when getting up. - left and right o Elevate, Exercise Daily and Avoid Standing for Long Periods of Time. o Elevate legs to the level of the heart and pump ankles as often as possible o Elevate leg(s) parallel to the floor when sitting. o Compression Pump: Use compression pump on left lower extremity for 60 minutes, twice daily. o Compression Pump: Use compression pump on right lower extremity for 60 minutes, twice daily. Wound Treatment Wound #43 - Calcaneus Wound Laterality: Right Cleanser: Soap and Water 1 x Per Week/30 Days Discharge Instructions: Gently cleanse wound with antibacterial soap, rinse and pat dry prior to dressing wounds Topical: Triamcinolone Acetonide Cream, 0.1%, 15 (g) tube 1 x Per Week/30 Days Discharge Instructions: Apply as directed by provider. Primary Dressing: Silvercel Small 2x2 (in/in) 1 x Per Week/30 Days Discharge Instructions: Apply Silvercel Small 2x2 (in/in) as  instructed Secondary Dressing: ABD Pad 5x9 (in/in) 1 x Per Week/30 Days Discharge Instructions: Cover with ABD pad Secondary Dressing: Kerlix 4.5 x 4.1 (in/yd) 1 x Per Week/30 Days Discharge Instructions: Apply Kerlix 4.5 x 4.1 (in/yd) as instructed Secured With: 84M Medipore H Soft Cloth Surgical Tape, 2x2 (in/yd) 1 x Per Week/30 Days Electronic Signature(s) Signed: 07/21/2021 1:12:15 PM By: Carlene Coria RN Signed: 07/21/2021 2:30:26 PM By: Worthy Keeler PA-C Entered By: Carlene Coria on 07/21/2021 13:12:14 Adam Merritt ([id number]) -------------------------------------------------------------------------------- SuperBill Details Patient Name: Adam Merritt Date of Service: 07/21/2021 Medical Record Number: [id number] Patient Account Number: [id number] Date of Birth/Sex: 03-28-1946 (75 y.o. 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

## 2021-07-25 ENCOUNTER — Ambulatory Visit: Payer: Medicare PPO | Admitting: Dermatology

## 2021-07-28 ENCOUNTER — Encounter: Payer: Medicare PPO | Admitting: Physician Assistant

## 2021-07-28 ENCOUNTER — Other Ambulatory Visit: Payer: Self-pay

## 2021-07-28 DIAGNOSIS — M1990 Unspecified osteoarthritis, unspecified site: Secondary | ICD-10-CM | POA: Diagnosis not present

## 2021-07-28 DIAGNOSIS — I890 Lymphedema, not elsewhere classified: Secondary | ICD-10-CM | POA: Diagnosis not present

## 2021-07-28 DIAGNOSIS — E1151 Type 2 diabetes mellitus with diabetic peripheral angiopathy without gangrene: Secondary | ICD-10-CM | POA: Diagnosis not present

## 2021-07-28 DIAGNOSIS — I872 Venous insufficiency (chronic) (peripheral): Secondary | ICD-10-CM | POA: Diagnosis not present

## 2021-07-28 DIAGNOSIS — E1140 Type 2 diabetes mellitus with diabetic neuropathy, unspecified: Secondary | ICD-10-CM | POA: Diagnosis not present

## 2021-07-28 DIAGNOSIS — L97812 Non-pressure chronic ulcer of other part of right lower leg with fat layer exposed: Secondary | ICD-10-CM | POA: Diagnosis not present

## 2021-07-28 DIAGNOSIS — L97822 Non-pressure chronic ulcer of other part of left lower leg with fat layer exposed: Secondary | ICD-10-CM | POA: Diagnosis not present

## 2021-07-28 DIAGNOSIS — L97412 Non-pressure chronic ulcer of right heel and midfoot with fat layer exposed: Secondary | ICD-10-CM | POA: Diagnosis not present

## 2021-07-28 DIAGNOSIS — L89612 Pressure ulcer of right heel, stage 2: Secondary | ICD-10-CM | POA: Diagnosis not present

## 2021-07-28 DIAGNOSIS — E11621 Type 2 diabetes mellitus with foot ulcer: Secondary | ICD-10-CM | POA: Diagnosis not present

## 2021-07-28 NOTE — Progress Notes (Addendum)
Adam, Merritt ([id number]) Visit Report for 07/28/2021 Chief Complaint Document Details Patient Name: Adam, Merritt Date of Service: 07/28/2021 12:30 PM Medical Record Number: [id number] Patient Account Number: [id number] Date of Birth/Sex: 1945/09/20 (76 y.o. M) Treating RN: Carlene Coria Primary Care Provider: [name] Other Clinician: Referring Provider: [name] Treating Provider/Extender: Skipper Cliche in Treatment: 17 Information Obtained from: Patient Chief Complaint Bilateral LE edema and weeping ulcers Electronic Signature(s) Signed: 07/28/2021 12:53:00 PM By: Worthy Keeler PA-C Entered By: Worthy Keeler on 07/28/2021 12:53:00 Adam Merritt ([id number]) -------------------------------------------------------------------------------- HPI Details Patient Name: Adam Merritt Date of Service: 07/28/2021 12:30 PM Medical Record Number: [id number] Patient Account Number: [id number] Date of Birth/Sex: November 26, 1945 (76 y.o. M) Treating RN: Carlene Coria Primary Care Provider: [name] Other Clinician: Referring Provider: [name] Treating Provider/Extender: Skipper Cliche in Treatment: 17 History of Present Illness HPI Description: 10/18/17-He is here for initial evaluation of bilateral lower extremity ulcerations in the presence of venous insufficiency and lymphedema. He has been seen by vascular medicine in the past, Dr. Lucky Cowboy, last seen in 2016. He does have a history of abnormal ABIs, which is to be expected given his lymphedema and venous insufficiency. According to Epic, it appears that all attempts for arterial evaluation and/or angiography were not follow through with by patient. He does have a history of being seen in lymphedema clinic in 2018, stopped going approximately 6 months ago stating "it didn't do any good". He does not have lymphedema pumps, he does not have custom fit compression wrap/stockings. He is diabetic and his recent A1c last month was  7.6. He admits to chronic bilateral lower extremity pain, no change in pain since blister and ulceration development. He is currently being treated with Levaquin for bronchitis. He has home health and we will continue. 10/25/17-He is here in follow-up evaluation for bilateral lower extremity ulcerationssubtle he remains on Levaquin for bronchitis. Right lower extremity with no evidence of drainage or ulceration, persistent left lower extremity ulceration. He states that home health has not been out since his appointment. He went to [facility] on Tuesday, studies revealed: RIGHT ABI 0.9, TBI 0.6 LEFT ABI 1.1, TBI 0.6 with triphasic flow bilaterally. We will continue his same treatment plan. He has been educated on compression therapy and need for elevation. He will benefit from lymphedema pumps 11/01/17-He is here in follow-up evaluation for left lower extremity ulcer. The right lower extremity remains healed. He has home health services, but they have not been out to see the patient for 2-3 weeks. He states it home health physical therapy changed his dressing yesterday after therapy; he placed Ace wrap compression. We are still waiting for lymphedema pumps, reordered d/t need for company change. 11/08/17-He is here in follow-up evaluation for left lower extremity ulcer. It is improved. Edema is significantly improved with compression therapy. We will continue with same treatment plan and he will follow-up next week. No word regarding lymphedema pumps 11/15/17-He is here in follow-up evaluation for left lower extremity ulcer. He is healed and will be discharged from wound care services. I have reached out to [facility] regarding his lymphedema pumps. They have been unable to reach the patient; the contact number they had with the patient's wife's cell phone and she has not answered any unrecognized calls. Contact should be made today, trial planned for next week; [facility]  will continue to follow 11/27/17 on evaluation today patient has multiple blistered areas over the right  lower extremity his left lower extremity appears to be doing okay. These blistered areas show signs of no infection which is great news. With that being said he did have some necrotic skin overlying which was mechanically debrided away with saline and gauze today without complication. Overall post debridement the wounds appear to be doing better but in general his swelling seems to be increased. This is obviously not good news. I think this is what has given rise to the blisters. 12/04/17 on evaluation today patient presents for follow-up concerning his bilateral lower extremity edema in the right lower extremity ulcers. He has been tolerating the dressing changes without complication. With that being said he has had no real issues with the wraps which is also good news. Overall I'm pleased with the progress he's been making. 12/11/17 on evaluation today patient appears to be doing rather well in regard to his right lateral lower extremity ulcer. He's been tolerating the dressing changes without complication. Fortunately there does not appear to be any evidence of infection at this time. Overall I'm pleased with the progress that is being made. Unfortunately he has been in the hospital due to having what sounds to be a stomach virus/flu fortunately that is starting to get better. 12/18/17 on evaluation today patient actually appears to be doing very well in regard to his bilateral lower extremities the swelling is under fairly good control his lymphedema pumps are still not up and running quite yet. With that being said he does have several areas of opening noted as far as wounds are concerned mainly over the left lower extremity. With that being said I do believe once he gets lymphedema pumps this would least help you mention some the fluid and preventing this from occurring. Hopefully that will be set  up soon sleeves are Artie in place at his home he just waiting for the machine. 12/25/17 on evaluation today patient actually appears to be doing excellent in fact all of his ulcers appear to have resolved his legs appear very well. I do think he needs compression stockings we have discussed this and they are actually going to go to [place] today to elastic therapy to get this fitted for him. I think that is definitely a good thing to do. Readmission: 04/09/18 upon evaluation today this patient is seen for readmission due to bilateral lower extremity lymphedema. He has significant swelling of his extremities especially on the left although the right is also swollen he has weeping from both sides. There are no obvious open wounds at this point. Fortunately he has been doing fairly well for quite a bit of time since I last saw him. Nonetheless unfortunately this seems to have reopened and is giving quite a bit of trouble. He states this began about a week ago when he first called [place] to get in to be seen. No fevers, chills, nausea, or vomiting noted at this time. He has not been using his lymphedema pumps due to the fact that they won't fit on his leg at this point likewise is also not been using his compression for essentially the same reason. 04/16/18 upon evaluation today patient actually appears to be doing a little better in regard to the fluid in his bilateral lower extremities. With that being said he's had three falls since I saw him last week. He also states that he's been feeling very poorly. I was concerned last week and feel like that the concern is still there as far as the congestion  in his chest is concerned he seems to be breathing about the same as last week but again he states he's very weak he's not even able to walk further than from the chair to the door. His wife had to buy a wheelchair just to be able to get them out of the house to get to the appointment today. This has me very  concerned. 04/23/18 on evaluation today patient actually appears to be doing much better than last week's evaluation. At that time actually had to transport him to the ER via EMS and he subsequently was admitted for acute pulmonary edema, acute renal failure, and acute congestive heart failure. Fortunately he is doing much better. Apparently they did dialyze him and were able to take off roughly 35 pounds of fluid. Nonetheless he is feeling much better both in regard to his breathing and he's able to get around much better at this time compared to previous. Overall I'm very MADOC, HOLQUIN ([id number]) happy with how things are at this time. There does not appear to be any evidence of infection currently. No fevers, chills, nausea, or vomiting noted at this time. 04/30/2018 patient seen today for follow-up and management of bilateral lower extremity lymphedema. He did express being more sad today than usual due to the recent loss of his dog. He states that he has been compliant with using the lymphedema pumps. However he does admit a minute over the last 2-3 days he has not been using the pumps due to the recent loss of his dog. At this time there is no drainage or open wounds to his lower extremities. The left leg edema is measuring smaller today. Still has a significant amount of edema on bilateral lower extremities With dry flaky skin. He will be referred to the lymphedema clinic for further management. Will continue 3 layer compression wraps and follow-up in 1-2 weeks.Denies any pain, fever, chairs recently. No recent falls or injuries reported during this visit. 05/07/18 on evaluation today patient actually appears to be doing very well in regard to his lower extremities in general all things considered. With that being said he is having some pain in the legs just due to the amount of swelling. He does have an area where he had a blister on the left lateral lower extremity this is open at this  point other than that there's nothing else weeping at this time. 05/14/18 on evaluation today patient actually appears to be doing excellent all things considered in regard to his lower extremities. He still has a couple areas of weeping on each leg which has continued to be the issue for him. He does have an appointment with the lymphedema clinic although this isn't until February 2020. That was the earliest they had. In the meantime he has continued to tolerate the compression wraps without complication. 05/28/18 on evaluation today patient actually appears to be doing more poorly in regard to his left lower extremity where he has a wound open at this point. He also had a fall where he subsequently injured his right great toe which has led to an open wound at the site unfortunately. He has been tolerating the dressing changes without complication in general as far as the wraps are concerned that he has not been putting any dressing on the left 1st toe ulcer site. 06/11/18 on evaluation today patient appears to be doing much worse in regard to his bilateral lower extremity ulcers. He has been tolerating the dressing changes without complication  although his legs have not been wrapped more recently. Overall I am not very pleased with the way his legs appear. I do believe he needs to be back in compression wraps he still has not received his compression wraps from the [facility] hospital as of yet. 06/18/18 on evaluation today patient actually appears to be doing significantly better than last time I saw him. He has been tolerating the compression wraps without complication in the circumferential ulcers especially appear to be doing much better. His toe ulcer on the right in regard to the great toe is better although not as good as the legs in my pinion. No fevers chills noted 07/02/18 on evaluation today patient appears to be doing much better in regard to his lower extremity ulcers. Unfortunately since I last  saw him he's had the distal portion of his right great toe if he dated it sounds as if this actually went downhill very quickly. I had only seen him a few days prior and the toe did not appear to be infected at that point subsequently became infected very rapidly and it was decided by the surgeon that the distal portion of the toe needed to be removed. The patient seems to be doing well in this regard he tells me. With that being said his lower extremities are doing better from the standpoint of the wounds although he is significantly swollen at this point. 07/09/18 on evaluation today patient appears to be doing better in regard to the wounds on his lower extremities. In fact everything is almost completely healed he is just a small area on the left posterior lower extremity that is open at this point. 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 With that being said he needs some kind of compression it does sound like he may not be wearing his compression, that is the wraps, during the entire time between when he's here visit to visit. Apparently his wife took the current one off because it began to "fall apart". 07/16/18 on evaluation today patient appears to be extremely swollen especially in regard to his left lower extremity unfortunately. He also has a new skin tear over the left lower extremity and there's a smaller area on the right lower extremity as well. Unfortunately this seems to be due in part to blistering and fluid buildup in his leg. He did get the reduction wraps that were ordered by the [facility] hospital for him to go to lymphedema clinic. With that being said his wounds on the legs have not healed to the point to where they would likely accept them as a patient lymphedema clinic currently. We need to try to  get this to heal. With that being said he's been taking his wraps off which is not doing him any favors at this point. In fact this is probably quite counterproductive compared to what needs to occur. We will likely need to increase to a four layer compression wrap and continue to also utilize elevation and he has to keep the wraps on not take them off as he's been doing currently hasn't had a wrap on since Saturday. 07/23/18 on evaluation today patient appears to be doing much better in regard to his bilateral lower extremities. In fact his left lower extremity which was the largest is actually 15 cm smaller today compared  to what it was last time he was here in our clinic. This is obviously good news after just one week. Nonetheless the differences he actually kept the wraps on during the entire week this time. That's not typical for him. I do believe he understands a little bit better now the severity of the situation and why it's important for him to keep these wraps on. 07/30/18 on evaluation today patient actually appears to be doing rather well in regard to his lower extremities. His legs are much smaller than they have been in the past and he actually has only one very small rudder superficial region remaining that is not closed on the left lateral/posterior lower extremity even this is almost completely close. I do believe likely next week he will be healed without any complications. I do think we need to continue the wraps however this seems to be beneficial for him. I also think it may be a good time for [place] to go ahead and see about getting the appointment with the lymphedema clinic which is supposed to be made for him in order to keep this moving along and hopefully get them into compression wraps that will in the end help him to remain healed. 08/06/18 on evaluation today patient actually appears to be doing very well in regard as bilateral lower extremities. In fact his wounds appear to  be completely healed at this time. 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 He is in the process of getting appointment with the lymphedema clinic we have made this referral were just waiting to hear back on the schedule time. We need to follow up on that today as well. 08/13/18 on evaluation today patient actually appears to be doing very well in regard to his bilateral lower extremities there are no open wounds at this point. We are gonna go ahead and see about ordering the Velcro compression wraps for him had a discussion with them about [place] doing it versus the [place] they feel like they can definitely afford going ahead and get the wraps themselves and they would prefer to try to avoid having to go to the lymphedema clinic if it all possible which I completely understand. As long as he has good compression I'm okay either way. 3/17//20 on evaluation today patient actually appears to be doing well in regard to his bilateral lower extremity ulcers. He has been tolerating the dressing changes without complication specifically the compression wraps. Overall is had no issues my fingers finding that I see at this point is that he is having trouble with constipation. He tells me he has not been able to go to the bathroom for about six days. He's taken to over-the- counter oral laxatives unfortunately this is not helping. He has not contacted his doctor. SHLOIMA, CLINCH ([id number]) 08/27/18 on evaluation today patient appears to be doing fairly well in regard to his lower extremities at this point. There does not appear to be any new altars is swelling is very well controlled. We are still waiting for his Velcro compression wraps to arrive that should be sometime in the next week is Artie sent the check for these. 09/03/18 on evaluation today patient appears to be doing excellent in regard to his bilateral lower extremities which he shows no signs of  wound openings in regard to this point. He does have his Velcro compression wraps which did arrive in the mail since I saw him last week.  Overall he is doing excellent in my pinion. 09/13/18 on evaluation today patient appears to be doing very well currently in regard to the overall appearance of his bilateral lower extremities although he's a little bit more swollen than last time we saw him. At that point he been discharged without any open wounds. Nonetheless he has a small open wound on the posterior left lower extremity with some evidence of cellulitis noted as well. Fortunately I feel like he has made good progress overall with regard to his lower extremities from were things used to be. 09/20/18 on evaluation today patient actually appears to be doing much better. The erythematous lower extremity is improving wound itself which is still open appears to be doing much better as far as but appearance as well as pain is concerned overall very pleased in this regard. There's no signs of active infection at this time. 09/27/18 on evaluation today patient's wounds on the lower extremity actually appear to be doing fairly well at this time which is good news. There is no evidence of active infection currently and again is just as left lower extremity were there any wounds at this point anyway. I believe they may be completely healed but again I'm not 100% sure based on evaluation today. I think one more week of observation would likely be a good idea. 10/04/18 on evaluation today patient actually appears to be doing excellent in regard to the left lower extremity which actually appears to be completely healed as of today. Unfortunately he's been having issues with his right lower extremity have a new wound that has opened. Fortunately there's no evidence of active infection at this time which is good news. 10/10/18 upon evaluation today patient actually appears to be doing a little bit worse with the new  open area on his right posterior lower extremity. He's been tolerating the dressing changes without complication. Right now we been using his compression wraps although I think we may need to switch back to actually performing bilateral compression wraps are in the clinic. No fevers, chills, nausea, or vomiting noted at this time. 10/17/18 on evaluation today patient actually appears to be doing quite well in regard to his bilateral lower extremity ulcers. He has been tolerating the reps without complication although he would prefer not to rewrap his legs as of today. Fortunately there's no signs of active infection which is good news. No fevers, chills, nausea, or vomiting noted at this time. 10/24/18 on evaluation today patient appears to be doing very well in regard to his lower extremities. His right lower extremity is shown signs of healing and his left lower [place] though not healed appears to be improving which is excellent news. Overall very pleased with how things seem to be progressing at this time. The patient is likewise happy to hear this. His Velcro compression wraps however have not been put on properly will gonna show his wife how to do that properly today. 10/31/18 on evaluation today patient appears to be doing more poorly in regard to his left lower extremity in particular although both lower extremities actually are showing some signs of being worse than my previous evaluation. Unfortunately I'm just not sure that his compression stockings even with the use of the compression/lymphedema pumps seem to be controlling this well. Upon further questioning he tells me that he also is not able to lie flat in the bed due to his congestive heart failure and difficulty breathing. For that reason he sleeps in his  recliner. To make matters worse his recliner also cannot even hold his legs up so instead of even being somewhat elevated they pretty much hang down to the floor. This is the way he  sleeps each night which is definitely counterproductive to everything else that were attempting to do from the standpoint of controlling his fluid. Nonetheless I think that he potentially could benefit from a hospital bed although this would be something that his primary care provider would likely have to order since anything that is order on our side has to be directly related to wound care and again the hospital bed is not necessarily a direct relation to although I think it does contribute to his overall wound status on his lower extremities. 11/07/18 on evaluation today patient appears to be doing better in regard to his bilateral lower extremity. He's been tolerating the dressing changes without complication. We did in the interim since I last saw him switch to just using extras orbiting new alginate and that seems to have done much better for him. I'm very pleased with the overall progress that is made. 11/14/18 on evaluation today patient appears to be redoing rather well in regard to his left lower extremity ulcers which are the only ones remaining at this point. Fortunately there's no signs of active infection at this time which is good news. Overall been very pleased with how things seem to be progressing currently. No fevers, chills, nausea, or vomiting noted at this time. 11/20/17 on evaluation today patient actually appears to be doing quite well in regard to his lower extremities on the left on the right he has several blisters that showed up although there's some question about whether or not he has had his broker compression wraps on like he was supposed to or not. Fortunately there's no signs of active infection at this time which is good news. Unfortunately though he is doing well from the standpoint of the left leg the right leg is not doing as well again this is when we did not wrap last week. 11/28/18 on evaluation today patient appears to be doing well in regard to his bilateral lower  extremities is left appears to be healed is right is not healed but is very close to being so. Overall very pleased with how things seem to be progressing. Patient is likewise happy that things are doing well. 12/09/18 on evaluation today patient actually appears to be completely healed which is excellent news. He actually seems to be doing well in regard to the swelling of the bilateral lower extremities which is also great news. Overall very pleased with how things seem to be progressing. Readmission: 03/18/2019 upon evaluation today patient presents for reevaluation concerning issues that he is having with his left lower extremity where he does have a wound noted upon inspection today. He also has a wound on the right second toe on the tip where it is apparently been rubbing in his shoe I did look at issues and you can actually see where this has been occurring as well. Fortunately there is no signs of infection with regard to the toe necessarily although it is very swollen compared to normal that does have me somewhat concerned about the possibility of further evaluation for infection/osteomyelitis. I would recommend an x-ray to start with. Otherwise he states that it is been 1-2 weeks that he has had the draining in regard to left lower extremity he does not know how this happened he has been wearing his  compression appropriately and I do see that as well today but nonetheless I do think that he needs to continue to be very cautious with regard to elevation as well. DESHAWN, WITTY ([id number]) 03/25/2019 on evaluation today patient appears to be doing much better in regard to his lower extremity at this time. That is on the left. He did culture positive for Pseudomonas but the good news is he seems to be doing much better the gentamicin we applied topically seems to have done a great job for him. There is no signs of active infection at this time systemically and locally he is doing much better.  No fevers, chills, nausea, vomiting, or diarrhea. In regard to his right toe this seems to be doing much better there is very little pressure noted at this point I did clean up some of the callus today around the edges of the wound as well as the surface of the wound but to be honest he is progressing quite nicely. 10/30; the area on the left anterior lower tibia area is healed over. His edema control is adequate even though his use of the compression pumps seems very intermittent. He has a juxta lite stocking on the right leg and a think there is 1 for the left leg at home. His wife is putting these on. He has an area on the plantar right second toe which is a hammertoe they are trying to offload this 04/08/2019 on evaluation today patient appears to be doing well in regard to his lower extremities which is good news. We are seeing him today for his toe ulcer which is giving him a little bit more trouble but again still seems to be doing better at this point which is good news. He is going require some sharp debridement in regard to the toe today however. 04/15/2019 on evaluation today patient actually appears to be doing quite well with regard to his toe ulcer. I am very pleased with how things are doing in that regard. With regard to his lower extremity edema in general he tells me he is not been using the lymphedema pumps regularly although he does not use them. I think that he needs to use them more regularly he does have a small blister on the left lower extremity this is not open at this point but obviously it something that can get worse if he does not keep the compression going and the pumps going as well. 04/22/2019 on evaluation today patient appears to be doing well with regard to his toe ulcer. He has been tolerating the dressing changes without complication. This is measuring slightly smaller this week as compared to last week. Fortunately there is no signs of active infection at this  time. 05/06/2019 on evaluation today patient actually appears to be doing excellent. In fact his toe appears to be completely healed which is great news. There is no signs of active infection at this time. Readmission: Patient presents today for follow-up after having been discharged at the beginning of December 2020. He states that in recent weeks things have reopened and has been having more trouble at this point. With that being said fortunately there is no signs of active infection at this time. No fever chills noted. Nonetheless he also does have an issue with one of his toes as well that seems to be somewhat this is on the right foot second toe. 07/25/2019 upon evaluation today patient's lower extremities appear to be doing excellent bilaterally. I really  feel like he is showing signs of great improvement and in fact I think he is close to healing which is great news. There is no signs of active infection at this time. No fevers, chills, nausea, vomiting, or diarrhea. 07/31/2019 upon evaluation today patient appears to be doing excellent and in fact I think may be completely healed in regard to his right lower extremity that were still to monitor this 1 more week before closing it out. The left lower extremity is measuring smaller although not completely closed seems to be doing excellent. 08/07/2019 upon evaluation today patient appears to be doing excellent in regard to his wounds. In fact the right lower extremity is completely healed there is no issues here. The left lower extremity has 1 very small area still remaining fortunately there is no signs of infection and overall I think he is very close to closure of the here as well. 08/14/19 upon evaluation today patient appears to be doing excellent in regard to his lower extremities. In fact he appears to be completely healed as of today. Fortunately there's no signs of active infection at this time. No fevers, chills, nausea, or vomiting noted  at this time. READMISSION 09/26/2019 This is a 76 year old man who is well-known to this clinic having just been discharged on 08/14/2019. He has known lymphedema and chronic venous insufficiency. He has Farrow wrap stockings and also external compression pumps. He does not use the external compression pumps however. He does however apparently fairly faithfully use the stockings. They have not been lotion in his legs. He has developed new wounds on the right anterior as well as the left medial left lateral and left posterior calf. He returns for our review of this Past medical history includes coronary artery disease, hypertension, congestive heart failure, COPD, venous insufficiency with lymphedema, type 2 diabetes. He apparently has compression pumps, Farrow wraps and at 1 point a hospital bed although I believe he sleeps in a recliner 10/03/2019 upon evaluation today patient appears to be doing better with regard to his wounds in general. He has been tolerating the dressing changes without complication. Fortunately there is no signs of active infection. No fevers, chills, nausea, vomiting, or diarrhea. I do believe the compression wraps are helping as they always have in the past. 10/10/2019 upon evaluation today patient appears to be doing excellent at this point. His right lower extremity is measuring much better and in fact is completely healed. His left lower extremity is also measuring better though not healed seems to be doing excellent at this point which is great news. Overall I am very pleased with how things appear currently. 10/27/2019 upon evaluation today patient appears to be doing quite well with regard to his wounds currently. He has been tolerating the dressing changes without complication. Fortunately there is no signs of active infection at this time. In fact he is almost completely healed on the left and has just a very small area open and on the right he is completely  healed. 11/04/2019 upon evaluation today patient appears to be doing quite well with regard to his wounds. In fact he appears to be quite possibly completely healed on the left lower extremity now as well the right lower extremity is still doing well. With that being said unfortunately though this may be healed I think it is a very fragile area and I cannot even confirm there is not a very small opening still remaining. I really think he may benefit from 1 additional  weeks wrapping before discontinuing. 11/10/2019 upon evaluation today patient appears to be doing well in regard to his original wounds in fact everything that we were treating last week is completely healed on the left. Unfortunately he has a new skin tear just above where the wrap slipped down due to a fall he sustained causing this injury. 11/17/2019 on evaluation today patient appears to be doing better with regard to his wound. He has been tolerating the dressing changes without NIKI, COSMAN. ([id number]) complication. Fortunately there is no signs of active infection at this time. No fevers, chills, nausea, vomiting, or diarrhea. 11/24/2019 upon evaluation today patient appears to be doing well with regard to his wound. This is making good progress there does not appear to be signs of active infection but overall I do feel like he is headed in the correct direction. Overall he is tolerating the compression wrap as well. 12/02/2019 upon evaluation today patient appears to be doing well with regard to his leg ulcer. In fact this appears to be completely healed and this is the last of the wounds that he had but still the left anterior lower extremity. Fortunately there is no signs of active infection at this time. No fevers, chills, nausea, vomiting, or diarrhea. Readmission: 02/05/2020 on evaluation today patient appears to be doing well with regard to his wound all things considered. He actually tells me that a couple weeks ago he had  trouble with his wraps and therefore took him off for a couple of days in order to give his legs a break. It was during this time that he actually developed increased swelling and edema of his legs and blisters on both legs. That is when he called to make the appointment. Nonetheless since that time he began wearing his compression wraps which are Velcro in the meantime and has done extremely well with this. In fact his leg appears to be under good control as far as edema is concerned today it is nothing like what I am seeing when he has come in for evaluations in the past. They have been using some of the silver alginate at home which has been beneficial for him. Fortunately there is no signs of active infection at this time. No fevers, chills, nausea, vomiting, or diarrhea. 02/19/2020 on evaluation today patient unfortunately does not appear to be doing quite as well as I would like to see. He has no signs of active infection at this time but unfortunately he is not doing well in regard to the overall appearance of his left leg. He has a couple other areas that are weeping now and the leg is much more swollen. I think that he needs to actually have a compression wrap to try to manage this. 02/26/2020 on evaluation today patient appears to be doing well with regard to his left lower extremity ulcer. Fortunately the swelling is down I do believe the pressure wrap seems to be doing quite well. Fortunately there is no signs of active infection at this time. 03/05/2020 upon evaluation today patient appears to be doing excellent in regard to his legs at this point. Fortunately there is no signs of active infection which is great news. Overall he feels like he is completely healed which is great news. Readmission: 05/20/2020 upon evaluation today patient appears to be doing well with regard to his leg ulcer all things considered. He is actually coming back to see [place] after having had a reopening of the ulcers  on his  left leg in the past several weeks. He is out of dressing supplies and his wife is been try to take care of this as best she can. 06/10/2020 upon evaluation today patient unfortunately appears to be doing significantly worse today compared to when I last saw him. He and his wife both tell me that "there has been a lot going on". I did not actually go into detail about everything that has been happening but nonetheless he has not obviously been wearing his compression. He brings them with him today but they were not on. I am not even certain to be honest that he could get those on at this point. Nonetheless I do believe that he is going to require compression wrapping at some point shortly but right now we need to try to get what appears to be infection under control at this time. 06/28/2020 upon evaluation today patient appears to be doing well with regard to 06/28/2020 upon evaluation today patient appears to be doing well pretty well in regard to his left leg which is much better than previous. With that being said in regard to his right leg he does seem to be having some issues with a new wound after he sustained a fall. This fortunately is not too deep but does appear to be something we need to address. Neither deep which is good. 07/05/2020 on evaluation today patient appears to be doing well with regard to his wounds. In fact everything on the left leg appears to be pretty much healed on the right leg this is very close though not completely closed yet. Fortunately there is no evidence of active infection at this time. No fevers, chills, nausea, vomiting, or diarrhea. 07/13/2019 upon evaluation today patient appears to be making good progress which is great news. Overall I am extremely pleased with where things stand today. There does not appear to be any signs of active infection which is great news and overall his left leg appears healed still the right leg though not healed does seem to be  making good progress which is excellent news. He is having no pain. 07/19/2020 on evaluation today patient appears to be doing well with regard to his wound. He has been tolerating the dressing changes without complication. Fortunately there does not appear to be any signs of active infection at this time. No fevers, chills, nausea, vomiting, or diarrhea. Readmission: 08/20/2020 on evaluation today patient presents for reevaluation here in the clinic concerning issues that he has been having with his bilateral lower extremities. Unfortunately this is an ongoing reoccurring issue. He tells me that he is really not certain exactly what caused the issue although as I discussed this further with him it appears that he has been sitting for the most part and sleeping in his chair with his feet on the ground he is not really elevating at all in fact the chair does not even have a reclining function therefore it is basically just him sitting with his feet on the ground. I think this alone has probably led to his increased swelling he is also having more difficulty walking which means that he is pumping less blood flow through his legs anyway as far as the venous stasis is concerned. All in all I think this is leading to a downward spiral of him not being able to walk very well as his legs are so heavy, they begin to weep, and overall he just does not seem to be doing as well as  it was when I last saw him. 08/27/2020 upon evaluation today patient's legs actually are showing signs of improvement which is great news. There does not appear to be any evidence of infection which is also excellent news. I am extremely pleased with where things stand today. No fevers, chills, nausea, vomiting, or diarrhea. 09/03/20 upon evaluation today patient appears to be doing excellent in regard to his wounds currently. He has been tolerating the dressing changes without complication and overall I am extremely pleased with where  things stand today. No fevers, chills, nausea, vomiting, or diarrhea. Overall he is healed on the right leg although the left leg not being completely healed still is doing significantly better. 09/10/2020/8/22 upon evaluation today patient appears to be doing well with regard to his wounds. He has been tolerating the dressing changes without complication. Fortunately he appears to be completely healed. I am very pleased in that regard. As is the patient his right leg is also doing well he did get a new recliner which is helping him keep his feet off the ground which I think is helped he is down 2 cm on the left compared to last week so I think there is some definite improvement here. CLEMON, DEVAUL ([id number]) Readmission 12/15/20: Mr. Habeeb Puertas is a 76 year old male with a past medical history of lymphedema, insulin-dependent type 2 diabetes, COPD and congestive heart failure that presents to the clinic for increased drainage from his legs bilaterally, Left greater than right. Patient has lymphedema. He has been seen in our clinic on multiple occasions for this issue. He currently denies signs of infection. He has not been using his lymphedema pumps. 12/23/2020 upon evaluation today patient appears to be doing well with regard to his wounds. He has been tolerating the dressing changes he really does not have any open wounds just weeping areas of the legs bilaterally and fortunately seems to be doing better at this point. 12/28/2020 upon evaluation today patient's wound is actually showing signs of good improvement. There does not appear to be any evidence of infection which is great news and overall I am extremely pleased at this point. I think is very close to complete resolution. 01/04/2021 upon evaluation today patient actually appears to be completely healed which is great news. I do not see any signs of active infection currently which is also great news. In general I am extremely pleased with  where we stand and I do believe that the patient is tolerating the dressing changes without complication. I believe he is ready to go back into his Velcro compression and I did see that they brought them with him today. Readmission: Patient presents today for follow-up he was last seen August 2. He is having some issues with some mild drainage and leaking from the left leg. This is something that has reopened and again is a recurrent issue for him. Subsequently he does wear his Velcro compression wraps though I think we may want to look into something a little bit better for him I am thinking that we may want to try the juxta fit compression which I think would do much more effective compression treatment overall for him. 04/08/2021 upon evaluation today patient appears to be doing well with regard to his legs. I do feel like he is showing signs of improvement which is great news. No fevers, chills, nausea, vomiting, or diarrhea. 04/15/2021 upon evaluation today patient's wounds actually appear to be very close to complete resolution. He is legs are  actually doing well left leg just has a small area of leaking at this point. Overall I think that we are very close to complete resolution based on what I am seeing. Nonetheless he seems to not be feeling too well today I really do not know what exactly is going on here. 04/22/2021 upon evaluation patient appears to be completely healed based on what I see today. I do not see anything open both legs appear to be doing quite well. 05/20/2021 upon evaluation today patient appears to be doing poorly in regard to his legs. He has not been a full month since we last saw him and he has reopened unfortunately. He does not appear to be any signs of active infection at this time which is great news. No fevers, chills, nausea, vomiting, or diarrhea. 12/27; the patient comes in the clinic today with severe bilateral lymphedema very dry skin but paradoxically his  wounds are actually healed or should I say not open. He has compression pumps that he does not use. [Once per week] he does not have compression stockings. He comes in today with a small presumably pressure ulcer on the lateral plantar heel on the right. The patient states he has been complaining about pain in this area for about 2 months although no wound was recognized until our intake nurse was doing her exam 06/16/2021 upon evaluation today patient appears to be doing better in regard to his wounds. He is not completely healed yet especially in regard to the right heel which is still open. Nonetheless I do think that his left leg is almost completely healed the right leg appears to be doing decently well his right heel is what still mainly open currently. 06/30/2021 upon evaluation today patient appears to be doing well with regard to his wound. Has been tolerating the dressing changes without complication. Fortunately I think his legs are doing quite well the heel is also doing better. In general I am very pleased with where we stand today. 07/14/2021 upon evaluation today patient appears to be doing well with regard to his heel ulcer. Fortunately I think that it is showing some signs of improvement unfortunately there is a little bit of skin overlying the wound surface which is actually causing this to breakdown a little bit to be honest. It looks a little macerated. Nonetheless we need to clean this away. 07/28/2021 upon evaluation today patient appears to be doing well currently in regard to his heel ulcer which is almost completely closed and this is great news. Lets about the end of where the great news stands. In regard to his legs unfortunately both legs are weeping at this point there really are not any true open wounds. Nonetheless I do believe that working half the compression wrap him in order to get this under control. I discussed that with the patient and his wife today. Electronic  Signature(s) Signed: 07/28/2021 1:13:45 PM By: Worthy Keeler PA-C Entered By: Worthy Keeler on 07/28/2021 13:13:44 Adam Merritt ([id number]) -------------------------------------------------------------------------------- Physical Exam Details Patient Name: Adam Merritt Date of Service: 07/28/2021 12:30 PM Medical Record Number: [id number] Patient Account Number: [id number] Date of Birth/Sex: 06/08/1945 (76 y.o. M) Treating RN: Carlene Coria Primary Care Provider: [name] Other Clinician: Referring Provider: [name] Treating Provider/Extender: Skipper Cliche in Treatment: 17 Constitutional Obese and well-hydrated in no acute distress. Respiratory normal breathing without difficulty. Psychiatric this patient is able to make decisions and demonstrates good insight into disease process. Alert  and Oriented x 3. pleasant and cooperative. Notes Upon inspection patient's heel is almost completely closed. With that being said he unfortunately is having issues with his legs bilaterally and will get have to do something to try to get this under control. That was discussed with the patient today. Electronic Signature(s) Signed: 07/28/2021 1:57:03 PM By: Worthy Keeler PA-C Entered By: Worthy Keeler on 07/28/2021 13:57:02 Adam Merritt ([id number]) -------------------------------------------------------------------------------- Physician Orders Details Patient Name: Adam Merritt Date of Service: 07/28/2021 12:30 PM Medical Record Number: [id number] Patient Account Number: [id number] Date of Birth/Sex: 16-Jun-1945 (76 y.o. M) Treating RN: Carlene Coria Primary Care Provider: [name] Other Clinician: Referring Provider: [name] Treating Provider/Extender: Skipper Cliche in Treatment: 17 Verbal / Phone Orders: No Diagnosis Coding ICD-10 Coding Code Description I89.0 Lymphedema, not elsewhere classified I87.2 Venous insufficiency (chronic) (peripheral) L97.812  Non-pressure chronic ulcer of other part of right lower leg with fat layer exposed L97.822 Non-pressure chronic ulcer of other part of left lower leg with fat layer exposed L89.612 Pressure ulcer of right heel, stage 2 Follow-up Appointments o Return Appointment in 1 week. o Nurse Visit as needed Bathing/ Shower/ Hygiene o May shower with wound dressing protected with water repellent cover or cast protector. o No tub bath. Anesthetic (Use 'Patient Medications' Section for Anesthetic Order Entry) o Lidocaine applied to wound bed Edema Control - Lymphedema / Segmental Compressive Device / Other o Optional: One layer of unna paste to top of compression wrap (to act as an anchor). o 4 Layer Compression System Lymphedema. - bi lat o Elevate, Exercise Daily and Avoid Standing for Long Periods of Time. o Elevate legs to the level of the heart and pump ankles as often as possible o Elevate leg(s) parallel to the floor when sitting. o Compression Pump: Use compression pump on left lower extremity for 60 minutes, twice daily. o Compression Pump: Use compression pump on right lower extremity for 60 minutes, twice daily. Wound Treatment Wound #43 - Calcaneus Wound Laterality: Right Cleanser: Soap and Water 1 x Per Week/30 Days Discharge Instructions: Gently cleanse wound with antibacterial soap, rinse and pat dry prior to dressing wounds Topical: Triamcinolone Acetonide Cream, 0.1%, 15 (g) tube 1 x Per Week/30 Days Discharge Instructions: Apply as directed by provider. Primary Dressing: Silvercel Small 2x2 (in/in) 1 x Per Week/30 Days Discharge Instructions: Apply Silvercel Small 2x2 (in/in) as instructed Secondary Dressing: ABD Pad 5x9 (in/in) 1 x Per Week/30 Days Discharge Instructions: Cover with ABD pad Secured With: 8M Medipore H Soft Cloth Surgical Tape, 2x2 (in/yd) 1 x Per Week/30 Days Compression Wrap: Medichoice 4 layer Compression System, 35-40 mmHG 1 x Per Week/30  Days Discharge Instructions: Apply multi-layer wrap as directed. Electronic Signature(s) Signed: 07/29/2021 4:05:24 PM By: Worthy Keeler PA-C Signed: 08/01/2021 8:01:43 AM By: Carlene Coria RN Entered By: Carlene Coria on 07/28/2021 13:04:49 JANARI, YAMADA ([id number]) RAYBURN, MUNDIS ([id number]) -------------------------------------------------------------------------------- Problem List Details Patient Name: Adam Merritt Date of Service: 07/28/2021 12:30 PM Medical Record Number: [id number] Patient Account Number: [id number] Date of Birth/Sex: June 08, 1945 (76 y.o. M) Treating RN: Carlene Coria Primary Care Provider: [name] Other Clinician: Referring Provider: [name] Treating Provider/Extender: Skipper Cliche in Treatment: 17 Active Problems ICD-10 Encounter Code Description Active Date MDM Diagnosis I89.0 Lymphedema, not elsewhere classified 03/25/2021 No Yes I87.2 Venous insufficiency (chronic) (peripheral) 03/25/2021 No Yes L97.812 Non-pressure chronic ulcer of other part of right lower leg with fat layer 03/25/2021 No Yes exposed L97.822 Non-pressure  chronic ulcer of other part of left lower leg with fat layer 03/25/2021 No Yes exposed L89.612 Pressure ulcer of right heel, stage 2 05/31/2021 No Yes Inactive Problems Resolved Problems Electronic Signature(s) Signed: 07/28/2021 12:52:56 PM By: Worthy Keeler PA-C Entered By: Worthy Keeler on 07/28/2021 12:52:56 Adam Merritt ([id number]) -------------------------------------------------------------------------------- Progress Note Details Patient Name: Adam Merritt Date of Service: 07/28/2021 12:30 PM Medical Record Number: [id number] Patient Account Number: [id number] Date of Birth/Sex: 06-16-1945 (76 y.o. M) Treating RN: Carlene Coria Primary Care Provider: [name] Other Clinician: Referring Provider: [name] Treating Provider/Extender: Skipper Cliche in Treatment: 17 Subjective Chief  Complaint Information obtained from Patient Bilateral LE edema and weeping ulcers History of Present Illness (HPI) 10/18/17-He is here for initial evaluation of bilateral lower extremity ulcerations in the presence of venous insufficiency and lymphedema. He has been seen by vascular medicine in the past, Dr. Lucky Cowboy, last seen in 2016. He does have a history of abnormal ABIs, which is to be expected given his lymphedema and venous insufficiency. According to Epic, it appears that all attempts for arterial evaluation and/or angiography were not follow through with by patient. He does have a history of being seen in lymphedema clinic in 2018, stopped going approximately 6 months ago stating "it didn't do any good". He does not have lymphedema pumps, he does not have custom fit compression wrap/stockings. He is diabetic and his recent A1c last month was 7.6. He admits to chronic bilateral lower extremity pain, no change in pain since blister and ulceration development. He is currently being treated with Levaquin for bronchitis. He has home health and we will continue. 10/25/17-He is here in follow-up evaluation for bilateral lower extremity ulcerationssubtle he remains on Levaquin for bronchitis. Right lower extremity with no evidence of drainage or ulceration, persistent left lower extremity ulceration. He states that home health has not been out since his appointment. He went to [facility] on Tuesday, studies revealed: RIGHT ABI 0.9, TBI 0.6 LEFT ABI 1.1, TBI 0.6 with triphasic flow bilaterally. We will continue his same treatment plan. He has been educated on compression therapy and need for elevation. He will benefit from lymphedema pumps 11/01/17-He is here in follow-up evaluation for left lower extremity ulcer. The right lower extremity remains healed. He has home health services, but they have not been out to see the patient for 2-3 weeks. He states it home health physical therapy  changed his dressing yesterday after therapy; he placed Ace wrap compression. We are still waiting for lymphedema pumps, reordered d/t need for company change. 11/08/17-He is here in follow-up evaluation for left lower extremity ulcer. It is improved. Edema is significantly improved with compression therapy. We will continue with same treatment plan and he will follow-up next week. No word regarding lymphedema pumps 11/15/17-He is here in follow-up evaluation for left lower extremity ulcer. He is healed and will be discharged from wound care services. I have reached out to [facility] regarding his lymphedema pumps. They have been unable to reach the patient; the contact number they had with the patient's wife's cell phone and she has not answered any unrecognized calls. Contact should be made today, trial planned for next week; [facility] will continue to follow 11/27/17 on evaluation today patient has multiple blistered areas over the right lower extremity his left lower extremity appears to be doing okay. These blistered areas show signs of no infection which is great news. With that being said he did  have some necrotic skin overlying which was mechanically debrided away with saline and gauze today without complication. Overall post debridement the wounds appear to be doing better but in general his swelling seems to be increased. This is obviously not good news. I think this is what has given rise to the blisters. 12/04/17 on evaluation today patient presents for follow-up concerning his bilateral lower extremity edema in the right lower extremity ulcers. He has been tolerating the dressing changes without complication. With that being said he has had no real issues with the wraps which is also good news. Overall I'm pleased with the progress he's been making. 12/11/17 on evaluation today patient appears to be doing rather well in regard to his right lateral lower extremity ulcer. He's been  tolerating the dressing changes without complication. Fortunately there does not appear to be any evidence of infection at this time. Overall I'm pleased with the progress that is being made. Unfortunately he has been in the hospital due to having what sounds to be a stomach virus/flu fortunately that is starting to get better. 12/18/17 on evaluation today patient actually appears to be doing very well in regard to his bilateral lower extremities the swelling is under fairly good control his lymphedema pumps are still not up and running quite yet. With that being said he does have several areas of opening noted as far as wounds are concerned mainly over the left lower extremity. With that being said I do believe once he gets lymphedema pumps this would least help you mention some the fluid and preventing this from occurring. Hopefully that will be set up soon sleeves are Artie in place at his home he just waiting for the machine. 12/25/17 on evaluation today patient actually appears to be doing excellent in fact all of his ulcers appear to have resolved his legs appear very well. I do think he needs compression stockings we have discussed this and they are actually going to go to [facility] today to elastic therapy to get this fitted for him. I think that is definitely a good thing to do. Readmission: 04/09/18 upon evaluation today this patient is seen for readmission due to bilateral lower extremity lymphedema. He has significant swelling of his extremities especially on the left although the right is also swollen he has weeping from both sides. There are no obvious open wounds at this point. Fortunately he has been doing fairly well for quite a bit of time since I last saw him. Nonetheless unfortunately this seems to have reopened and is giving quite a bit of trouble. He states this began about a week ago when he first called [place] to get in to be seen. No fevers, chills, nausea, or vomiting noted at this  time. He has not been using his lymphedema pumps due to the fact that they won't fit on his leg at this point likewise is also not been using his compression for essentially the same reason. 04/16/18 upon evaluation today patient actually appears to be doing a little better in regard to the fluid in his bilateral lower extremities. With that being said he's had three falls since I saw him last week. He also states that he's been feeling very poorly. I was concerned last week and feel like that the concern is still there as far as the congestion in his chest is concerned he seems to be breathing about the same as last week but again he states he's very weak he's not even able to  walk further than from the chair to the door. His wife had to buy a wheelchair just to be able to get them out of the house to get to the appointment today. This has me very concerned. REYAN, HELLE ([id number]) 04/23/18 on evaluation today patient actually appears to be doing much better than last week's evaluation. At that time actually had to transport him to the ER via EMS and he subsequently was admitted for acute pulmonary edema, acute renal failure, and acute congestive heart failure. Fortunately he is doing much better. Apparently they did dialyze him and were able to take off roughly 35 pounds of fluid. Nonetheless he is feeling much better both in regard to his breathing and he's able to get around much better at this time compared to previous. Overall I'm very happy with how things are at this time. There does not appear to be any evidence of infection currently. No fevers, chills, nausea, or vomiting noted at this time. 04/30/2018 patient seen today for follow-up and management of bilateral lower extremity lymphedema. He did express being more sad today than usual due to the recent loss of his dog. He states that he has been compliant with using the lymphedema pumps. However he does admit a minute over the last  2-3 days he has not been using the pumps due to the recent loss of his dog. At this time there is no drainage or open wounds to his lower extremities. The left leg edema is measuring smaller today. Still has a significant amount of edema on bilateral lower extremities With dry flaky skin. He will be referred to the lymphedema clinic for further management. Will continue 3 layer compression wraps and follow-up in 1-2 weeks.Denies any pain, fever, chairs recently. No recent falls or injuries reported during this visit. 05/07/18 on evaluation today patient actually appears to be doing very well in regard to his lower extremities in general all things considered. With that being said he is having some pain in the legs just due to the amount of swelling. He does have an area where he had a blister on the left lateral lower extremity this is open at this point other than that there's nothing else weeping at this time. 05/14/18 on evaluation today patient actually appears to be doing excellent all things considered in regard to his lower extremities. He still has a couple areas of weeping on each leg which has continued to be the issue for him. He does have an appointment with the lymphedema clinic although this isn't until February 2020. That was the earliest they had. In the meantime he has continued to tolerate the compression wraps without complication. 05/28/18 on evaluation today patient actually appears to be doing more poorly in regard to his left lower extremity where he has a wound open at this point. He also had a fall where he subsequently injured his right great toe which has led to an open wound at the site unfortunately. He has been tolerating the dressing changes without complication in general as far as the wraps are concerned that he has not been putting any dressing on the left 1st toe ulcer site. 06/11/18 on evaluation today patient appears to be doing much worse in regard to his bilateral  lower extremity ulcers. He has been tolerating the dressing changes without complication although his legs have not been wrapped more recently. Overall I am not very pleased with the way his legs appear. I do believe he needs to be  back in compression wraps he still has not received his compression wraps from the [facility] hospital as of yet. 06/18/18 on evaluation today patient actually appears to be doing significantly better than last time I saw him. He has been tolerating the compression wraps without complication in the circumferential ulcers especially appear to be doing much better. His toe ulcer on the right in regard to the great toe is better although not as good as the legs in my pinion. No fevers chills noted 07/02/18 on evaluation today patient appears to be doing much better in regard to his lower extremity ulcers. Unfortunately since I last saw him he's had the distal portion of his right great toe if he dated it sounds as if this actually went downhill very quickly. I had only seen him a few days prior and the toe did not appear to be infected at that point subsequently became infected very rapidly and it was decided by the surgeon that the distal portion of the toe needed to be removed. The patient seems to be doing well in this regard he tells me. With that being said his lower extremities are doing better from the standpoint of the wounds although he is significantly swollen at this point. 07/09/18 on evaluation today patient appears to be doing better in regard to the wounds on his lower extremities. In fact everything is almost completely healed he is just a small area on the left posterior lower extremity that is open at this point. 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 With that being said he needs some  kind of compression it does sound like he may not be wearing his compression, that is the wraps, during the entire time between when he's here visit to visit. Apparently his wife took the current one off because it began to "fall apart". 07/16/18 on evaluation today patient appears to be extremely swollen especially in regard to his left lower extremity unfortunately. He also has a new skin tear over the left lower extremity and there's a smaller area on the right lower extremity as well. Unfortunately this seems to be due in part to blistering and fluid buildup in his leg. He did get the reduction wraps that were ordered by the [facility] hospital for him to go to lymphedema clinic. With that being said his wounds on the legs have not healed to the point to where they would likely accept them as a patient lymphedema clinic currently. We need to try to get this to heal. With that being said he's been taking his wraps off which is not doing him any favors at this point. In fact this is probably quite counterproductive compared to what needs to occur. We will likely need to increase to a four layer compression wrap and continue to also utilize elevation and he has to keep the wraps on not take them off as he's been doing currently hasn't had a wrap on since Saturday. 07/23/18 on evaluation today patient appears to be doing much better in regard to his bilateral lower extremities. In fact his left lower extremity which was the largest is actually 15 cm smaller today compared to what it was last time he was here in our clinic. This is obviously good news after just one week. Nonetheless the differences he actually kept the wraps  on during the entire week this time. That's not typical for him. I do believe he understands a little bit better now the severity of the situation and why it's important for him to keep these wraps on. 07/30/18 on evaluation today patient actually appears to be doing rather well in regard to  his lower extremities. His legs are much smaller than they have been in the past and he actually has only one very small rudder superficial region remaining that is not closed on the left lateral/posterior lower extremity even this is almost completely close. I do believe likely next week he will be healed without any complications. I do think we need to continue the wraps however this seems to be beneficial for him. I also think it may be a good time for [place] to go ahead and see about getting the appointment with the lymphedema clinic which is supposed to be made for him in order to keep this moving along and hopefully get them into compression wraps that will in the end help him to remain healed. 08/06/18 on evaluation today patient actually appears to be doing very well in regard as bilateral lower extremities. In fact his wounds appear to be completely healed at this time. 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 He is in the process of getting appointment with the lymphedema clinic we have made this referral were just waiting to hear back on the schedule time. We need to follow up on that today as well. 08/13/18 on evaluation today patient actually appears to be doing very well in regard to his bilateral lower extremities there are no open wounds at this point. We are gonna go ahead and see about ordering the Velcro compression wraps for him had a discussion with them about [place] doing it versus the [place] they feel like they can definitely afford going ahead and get the wraps themselves and they would prefer to try to avoid having to go to the lymphedema clinic if it all possible which I completely understand. As long as he has good compression I'm okay either way. STANELY, SEXSON ([id number]) 3/17//20 on evaluation today patient actually appears to be doing well in regard to his bilateral lower extremity ulcers. He has been tolerating  the dressing changes without complication specifically the compression wraps. Overall is had no issues my fingers finding that I see at this point is that he is having trouble with constipation. He tells me he has not been able to go to the bathroom for about six days. He's taken to over-the- counter oral laxatives unfortunately this is not helping. He has not contacted his doctor. 08/27/18 on evaluation today patient appears to be doing fairly well in regard to his lower extremities at this point. There does not appear to be any new altars is swelling is very well controlled. We are still waiting for his Velcro compression wraps to arrive that should be sometime in the next week is Artie sent the check for these. 09/03/18 on evaluation today patient appears to be doing excellent in regard to his bilateral lower extremities which he shows no signs of wound openings in regard to this point. He does have his Velcro compression wraps which did arrive in the mail since I saw him last week. Overall he is doing excellent in my pinion. 09/13/18 on evaluation today patient appears to be doing very well currently in regard to the overall appearance of his bilateral  lower extremities although he's a little bit more swollen than last time we saw him. At that point he been discharged without any open wounds. Nonetheless he has a small open wound on the posterior left lower extremity with some evidence of cellulitis noted as well. Fortunately I feel like he has made good progress overall with regard to his lower extremities from were things used to be. 09/20/18 on evaluation today patient actually appears to be doing much better. The erythematous lower extremity is improving wound itself which is still open appears to be doing much better as far as but appearance as well as pain is concerned overall very pleased in this regard. There's no signs of active infection at this time. 09/27/18 on evaluation today patient's  wounds on the lower extremity actually appear to be doing fairly well at this time which is good news. There is no evidence of active infection currently and again is just as left lower extremity were there any wounds at this point anyway. I believe they may be completely healed but again I'm not 100% sure based on evaluation today. I think one more week of observation would likely be a good idea. 10/04/18 on evaluation today patient actually appears to be doing excellent in regard to the left lower extremity which actually appears to be completely healed as of today. Unfortunately he's been having issues with his right lower extremity have a new wound that has opened. Fortunately there's no evidence of active infection at this time which is good news. 10/10/18 upon evaluation today patient actually appears to be doing a little bit worse with the new open area on his right posterior lower extremity. He's been tolerating the dressing changes without complication. Right now we been using his compression wraps although I think we may need to switch back to actually performing bilateral compression wraps are in the clinic. No fevers, chills, nausea, or vomiting noted at this time. 10/17/18 on evaluation today patient actually appears to be doing quite well in regard to his bilateral lower extremity ulcers. He has been tolerating the reps without complication although he would prefer not to rewrap his legs as of today. Fortunately there's no signs of active infection which is good news. No fevers, chills, nausea, or vomiting noted at this time. 10/24/18 on evaluation today patient appears to be doing very well in regard to his lower extremities. His right lower extremity is shown signs of healing and his left lower Trinity though not healed appears to be improving which is excellent news. Overall very pleased with how things seem to be progressing at this time. The patient is likewise happy to hear this. His  Velcro compression wraps however have not been put on properly will gonna show his wife how to do that properly today. 10/31/18 on evaluation today patient appears to be doing more poorly in regard to his left lower extremity in particular although both lower extremities actually are showing some signs of being worse than my previous evaluation. Unfortunately I'm just not sure that his compression stockings even with the use of the compression/lymphedema pumps seem to be controlling this well. Upon further questioning he tells me that he also is not able to lie flat in the bed due to his congestive heart failure and difficulty breathing. For that reason he sleeps in his recliner. To make matters worse his recliner also cannot even hold his legs up so instead of even being somewhat elevated they pretty much hang down to the  floor. This is the way he sleeps each night which is definitely counterproductive to everything else that were attempting to do from the standpoint of controlling his fluid. Nonetheless I think that he potentially could benefit from a hospital bed although this would be something that his primary care provider would likely have to order since anything that is order on our side has to be directly related to wound care and again the hospital bed is not necessarily a direct relation to although I think it does contribute to his overall wound status on his lower extremities. 11/07/18 on evaluation today patient appears to be doing better in regard to his bilateral lower extremity. He's been tolerating the dressing changes without complication. We did in the interim since I last saw him switch to just using extras orbiting new alginate and that seems to have done much better for him. I'm very pleased with the overall progress that is made. 11/14/18 on evaluation today patient appears to be redoing rather well in regard to his left lower extremity ulcers which are the only ones remaining at  this point. Fortunately there's no signs of active infection at this time which is good news. Overall been very pleased with how things seem to be progressing currently. No fevers, chills, nausea, or vomiting noted at this time. 11/20/17 on evaluation today patient actually appears to be doing quite well in regard to his lower extremities on the left on the right he has several blisters that showed up although there's some question about whether or not he has had his broker compression wraps on like he was supposed to or not. Fortunately there's no signs of active infection at this time which is good news. Unfortunately though he is doing well from the standpoint of the left leg the right leg is not doing as well again this is when we did not wrap last week. 11/28/18 on evaluation today patient appears to be doing well in regard to his bilateral lower extremities is left appears to be healed is right is not healed but is very close to being so. Overall very pleased with how things seem to be progressing. Patient is likewise happy that things are doing well. 12/09/18 on evaluation today patient actually appears to be completely healed which is excellent news. He actually seems to be doing well in regard to the swelling of the bilateral lower extremities which is also great news. Overall very pleased with how things seem to be progressing. Readmission: 03/18/2019 upon evaluation today patient presents for reevaluation concerning issues that he is having with his left lower extremity where he does have a wound noted upon inspection today. He also has a wound on the right second toe on the tip where it is apparently been rubbing in his shoe I did look at issues and you can actually see where this has been occurring as well. Fortunately there is no signs of infection with regard to GREGORIO, WORLEY. ([id number]) the toe necessarily although it is very swollen compared to normal that does have me somewhat  concerned about the possibility of further evaluation for infection/osteomyelitis. I would recommend an x-ray to start with. Otherwise he states that it is been 1-2 weeks that he has had the draining in regard to left lower extremity he does not know how this happened he has been wearing his compression appropriately and I do see that as well today but nonetheless I do think that he needs to continue to be very cautious  with regard to elevation as well. 03/25/2019 on evaluation today patient appears to be doing much better in regard to his lower extremity at this time. That is on the left. He did culture positive for Pseudomonas but the good news is he seems to be doing much better the gentamicin we applied topically seems to have done a great job for him. There is no signs of active infection at this time systemically and locally he is doing much better. No fevers, chills, nausea, vomiting, or diarrhea. In regard to his right toe this seems to be doing much better there is very little pressure noted at this point I did clean up some of the callus today around the edges of the wound as well as the surface of the wound but to be honest he is progressing quite nicely. 10/30; the area on the left anterior lower tibia area is healed over. His edema control is adequate even though his use of the compression pumps seems very intermittent. He has a juxta lite stocking on the right leg and a think there is 1 for the left leg at home. His wife is putting these on. He has an area on the plantar right second toe which is a hammertoe they are trying to offload this 04/08/2019 on evaluation today patient appears to be doing well in regard to his lower extremities which is good news. We are seeing him today for his toe ulcer which is giving him a little bit more trouble but again still seems to be doing better at this point which is good news. He is going require some sharp debridement in regard to the toe today  however. 04/15/2019 on evaluation today patient actually appears to be doing quite well with regard to his toe ulcer. I am very pleased with how things are doing in that regard. With regard to his lower extremity edema in general he tells me he is not been using the lymphedema pumps regularly although he does not use them. I think that he needs to use them more regularly he does have a small blister on the left lower extremity this is not open at this point but obviously it something that can get worse if he does not keep the compression going and the pumps going as well. 04/22/2019 on evaluation today patient appears to be doing well with regard to his toe ulcer. He has been tolerating the dressing changes without complication. This is measuring slightly smaller this week as compared to last week. Fortunately there is no signs of active infection at this time. 05/06/2019 on evaluation today patient actually appears to be doing excellent. In fact his toe appears to be completely healed which is great news. There is no signs of active infection at this time. Readmission: Patient presents today for follow-up after having been discharged at the beginning of December 2020. He states that in recent weeks things have reopened and has been having more trouble at this point. With that being said fortunately there is no signs of active infection at this time. No fever chills noted. Nonetheless he also does have an issue with one of his toes as well that seems to be somewhat this is on the right foot second toe. 07/25/2019 upon evaluation today patient's lower extremities appear to be doing excellent bilaterally. I really feel like he is showing signs of great improvement and in fact I think he is close to healing which is great news. There is no signs of active  infection at this time. No fevers, chills, nausea, vomiting, or diarrhea. 07/31/2019 upon evaluation today patient appears to be doing excellent and in  fact I think may be completely healed in regard to his right lower extremity that were still to monitor this 1 more week before closing it out. The left lower extremity is measuring smaller although not completely closed seems to be doing excellent. 08/07/2019 upon evaluation today patient appears to be doing excellent in regard to his wounds. In fact the right lower extremity is completely healed there is no issues here. The left lower extremity has 1 very small area still remaining fortunately there is no signs of infection and overall I think he is very close to closure of the here as well. 08/14/19 upon evaluation today patient appears to be doing excellent in regard to his lower extremities. In fact he appears to be completely healed as of today. Fortunately there's no signs of active infection at this time. No fevers, chills, nausea, or vomiting noted at this time. READMISSION 09/26/2019 This is a 76 year old man who is well-known to this clinic having just been discharged on 08/14/2019. He has known lymphedema and chronic venous insufficiency. He has Farrow wrap stockings and also external compression pumps. He does not use the external compression pumps however. He does however apparently fairly faithfully use the stockings. They have not been lotion in his legs. He has developed new wounds on the right anterior as well as the left medial left lateral and left posterior calf. He returns for our review of this Past medical history includes coronary artery disease, hypertension, congestive heart failure, COPD, venous insufficiency with lymphedema, type 2 diabetes. He apparently has compression pumps, Farrow wraps and at 1 point a hospital bed although I believe he sleeps in a recliner 10/03/2019 upon evaluation today patient appears to be doing better with regard to his wounds in general. He has been tolerating the dressing changes without complication. Fortunately there is no signs of active  infection. No fevers, chills, nausea, vomiting, or diarrhea. I do believe the compression wraps are helping as they always have in the past. 10/10/2019 upon evaluation today patient appears to be doing excellent at this point. His right lower extremity is measuring much better and in fact is completely healed. His left lower extremity is also measuring better though not healed seems to be doing excellent at this point which is great news. Overall I am very pleased with how things appear currently. 10/27/2019 upon evaluation today patient appears to be doing quite well with regard to his wounds currently. He has been tolerating the dressing changes without complication. Fortunately there is no signs of active infection at this time. In fact he is almost completely healed on the left and has just a very small area open and on the right he is completely healed. 11/04/2019 upon evaluation today patient appears to be doing quite well with regard to his wounds. In fact he appears to be quite possibly completely healed on the left lower extremity now as well the right lower extremity is still doing well. With that being said unfortunately though this may be healed I think it is a very fragile area and I cannot even confirm there is not a very small opening still remaining. I really think he may benefit from 1 additional weeks wrapping before discontinuing. WANDELL, SCULLION ([id number]) 11/10/2019 upon evaluation today patient appears to be doing well in regard to his original wounds in fact everything that we  were treating last week is completely healed on the left. Unfortunately he has a new skin tear just above where the wrap slipped down due to a fall he sustained causing this injury. 11/17/2019 on evaluation today patient appears to be doing better with regard to his wound. He has been tolerating the dressing changes without complication. Fortunately there is no signs of active infection at this time. No fevers,  chills, nausea, vomiting, or diarrhea. 11/24/2019 upon evaluation today patient appears to be doing well with regard to his wound. This is making good progress there does not appear to be signs of active infection but overall I do feel like he is headed in the correct direction. Overall he is tolerating the compression wrap as well. 12/02/2019 upon evaluation today patient appears to be doing well with regard to his leg ulcer. In fact this appears to be completely healed and this is the last of the wounds that he had but still the left anterior lower extremity. Fortunately there is no signs of active infection at this time. No fevers, chills, nausea, vomiting, or diarrhea. Readmission: 02/05/2020 on evaluation today patient appears to be doing well with regard to his wound all things considered. He actually tells me that a couple weeks ago he had trouble with his wraps and therefore took him off for a couple of days in order to give his legs a break. It was during this time that he actually developed increased swelling and edema of his legs and blisters on both legs. That is when he called to make the appointment. Nonetheless since that time he began wearing his compression wraps which are Velcro in the meantime and has done extremely well with this. In fact his leg appears to be under good control as far as edema is concerned today it is nothing like what I am seeing when he has come in for evaluations in the past. They have been using some of the silver alginate at home which has been beneficial for him. Fortunately there is no signs of active infection at this time. No fevers, chills, nausea, vomiting, or diarrhea. 02/19/2020 on evaluation today patient unfortunately does not appear to be doing quite as well as I would like to see. He has no signs of active infection at this time but unfortunately he is not doing well in regard to the overall appearance of his left leg. He has a couple other areas that  are weeping now and the leg is much more swollen. I think that he needs to actually have a compression wrap to try to manage this. 02/26/2020 on evaluation today patient appears to be doing well with regard to his left lower extremity ulcer. Fortunately the swelling is down I do believe the pressure wrap seems to be doing quite well. Fortunately there is no signs of active infection at this time. 03/05/2020 upon evaluation today patient appears to be doing excellent in regard to his legs at this point. Fortunately there is no signs of active infection which is great news. Overall he feels like he is completely healed which is great news. Readmission: 05/20/2020 upon evaluation today patient appears to be doing well with regard to his leg ulcer all things considered. He is actually coming back to see [place] after having had a reopening of the ulcers on his left leg in the past several weeks. He is out of dressing supplies and his wife is been try to take care of this as best she can. 06/10/2020  upon evaluation today patient unfortunately appears to be doing significantly worse today compared to when I last saw him. He and his wife both tell me that "there has been a lot going on". I did not actually go into detail about everything that has been happening but nonetheless he has not obviously been wearing his compression. He brings them with him today but they were not on. I am not even certain to be honest that he could get those on at this point. Nonetheless I do believe that he is going to require compression wrapping at some point shortly but right now we need to try to get what appears to be infection under control at this time. 06/28/2020 upon evaluation today patient appears to be doing well with regard to 06/28/2020 upon evaluation today patient appears to be doing well pretty well in regard to his left leg which is much better than previous. With that being said in regard to his right leg he does seem to  be having some issues with a new wound after he sustained a fall. This fortunately is not too deep but does appear to be something we need to address. Neither deep which is good. 07/05/2020 on evaluation today patient appears to be doing well with regard to his wounds. In fact everything on the left leg appears to be pretty much healed on the right leg this is very close though not completely closed yet. Fortunately there is no evidence of active infection at this time. No fevers, chills, nausea, vomiting, or diarrhea. 07/13/2019 upon evaluation today patient appears to be making good progress which is great news. Overall I am extremely pleased with where things stand today. There does not appear to be any signs of active infection which is great news and overall his left leg appears healed still the right leg though not healed does seem to be making good progress which is excellent news. He is having no pain. 07/19/2020 on evaluation today patient appears to be doing well with regard to his wound. He has been tolerating the dressing changes without complication. Fortunately there does not appear to be any signs of active infection at this time. No fevers, chills, nausea, vomiting, or diarrhea. Readmission: 08/20/2020 on evaluation today patient presents for reevaluation here in the clinic concerning issues that he has been having with his bilateral lower extremities. Unfortunately this is an ongoing reoccurring issue. He tells me that he is really not certain exactly what caused the issue although as I discussed this further with him it appears that he has been sitting for the most part and sleeping in his chair with his feet on the ground he is not really elevating at all in fact the chair does not even have a reclining function therefore it is basically just him sitting with his feet on the ground. I think this alone has probably led to his increased swelling he is also having more difficulty walking  which means that he is pumping less blood flow through his legs anyway as far as the venous stasis is concerned. All in all I think this is leading to a downward spiral of him not being able to walk very well as his legs are so heavy, they begin to weep, and overall he just does not seem to be doing as well as it was when I last saw him. 08/27/2020 upon evaluation today patient's legs actually are showing signs of improvement which is great news. There does not appear to  be any evidence of infection which is also excellent news. I am extremely pleased with where things stand today. No fevers, chills, nausea, vomiting, or diarrhea. 09/03/20 upon evaluation today patient appears to be doing excellent in regard to his wounds currently. He has been tolerating the dressing changes without complication and overall I am extremely pleased with where things stand today. No fevers, chills, nausea, vomiting, or diarrhea. Overall he is healed on the right leg although the left leg not being completely healed still is doing significantly better. KINSLER, SOEDER ([id number]) 09/10/2020/8/22 upon evaluation today patient appears to be doing well with regard to his wounds. He has been tolerating the dressing changes without complication. Fortunately he appears to be completely healed. I am very pleased in that regard. As is the patient his right leg is also doing well he did get a new recliner which is helping him keep his feet off the ground which I think is helped he is down 2 cm on the left compared to last week so I think there is some definite improvement here. Readmission 12/15/20: Mr. Hartford Maulden is a 77 year old male with a past medical history of lymphedema, insulin-dependent type 2 diabetes, COPD and congestive heart failure that presents to the clinic for increased drainage from his legs bilaterally, Left greater than right. Patient has lymphedema. He has been seen in our clinic on multiple occasions for this  issue. He currently denies signs of infection. He has not been using his lymphedema pumps. 12/23/2020 upon evaluation today patient appears to be doing well with regard to his wounds. He has been tolerating the dressing changes he really does not have any open wounds just weeping areas of the legs bilaterally and fortunately seems to be doing better at this point. 12/28/2020 upon evaluation today patient's wound is actually showing signs of good improvement. There does not appear to be any evidence of infection which is great news and overall I am extremely pleased at this point. I think is very close to complete resolution. 01/04/2021 upon evaluation today patient actually appears to be completely healed which is great news. I do not see any signs of active infection currently which is also great news. In general I am extremely pleased with where we stand and I do believe that the patient is tolerating the dressing changes without complication. I believe he is ready to go back into his Velcro compression and I did see that they brought them with him today. Readmission: Patient presents today for follow-up he was last seen August 2. He is having some issues with some mild drainage and leaking from the left leg. This is something that has reopened and again is a recurrent issue for him. Subsequently he does wear his Velcro compression wraps though I think we may want to look into something a little bit better for him I am thinking that we may want to try the juxta fit compression which I think would do much more effective compression treatment overall for him. 04/08/2021 upon evaluation today patient appears to be doing well with regard to his legs. I do feel like he is showing signs of improvement which is great news. No fevers, chills, nausea, vomiting, or diarrhea. 04/15/2021 upon evaluation today patient's wounds actually appear to be very close to complete resolution. He is legs are actually doing  well left leg just has a small area of leaking at this point. Overall I think that we are very close to complete resolution based on  what I am seeing. Nonetheless he seems to not be feeling too well today I really do not know what exactly is going on here. 04/22/2021 upon evaluation patient appears to be completely healed based on what I see today. I do not see anything open both legs appear to be doing quite well. 05/20/2021 upon evaluation today patient appears to be doing poorly in regard to his legs. He has not been a full month since we last saw him and he has reopened unfortunately. He does not appear to be any signs of active infection at this time which is great news. No fevers, chills, nausea, vomiting, or diarrhea. 12/27; the patient comes in the clinic today with severe bilateral lymphedema very dry skin but paradoxically his wounds are actually healed or should I say not open. He has compression pumps that he does not use. [Once per week] he does not have compression stockings. He comes in today with a small presumably pressure ulcer on the lateral plantar heel on the right. The patient states he has been complaining about pain in this area for about 2 months although no wound was recognized until our intake nurse was doing her exam 06/16/2021 upon evaluation today patient appears to be doing better in regard to his wounds. He is not completely healed yet especially in regard to the right heel which is still open. Nonetheless I do think that his left leg is almost completely healed the right leg appears to be doing decently well his right heel is what still mainly open currently. 06/30/2021 upon evaluation today patient appears to be doing well with regard to his wound. Has been tolerating the dressing changes without complication. Fortunately I think his legs are doing quite well the heel is also doing better. In general I am very pleased with where we stand today. 07/14/2021 upon  evaluation today patient appears to be doing well with regard to his heel ulcer. Fortunately I think that it is showing some signs of improvement unfortunately there is a little bit of skin overlying the wound surface which is actually causing this to breakdown a little bit to be honest. It looks a little macerated. Nonetheless we need to clean this away. 07/28/2021 upon evaluation today patient appears to be doing well currently in regard to his heel ulcer which is almost completely closed and this is great news. Lets about the end of where the great news stands. In regard to his legs unfortunately both legs are weeping at this point there really are not any true open wounds. Nonetheless I do believe that working half the compression wrap him in order to get this under control. I discussed that with the patient and his wife today. Objective Constitutional Obese and well-hydrated in no acute distress. TAMI, BARREN ([id number]) Vitals Time Taken: 12:42 PM, Height: 66 in, Weight: 300 lbs, BMI: 48.4, Temperature: 98.9 F, Pulse: 85 bpm, Respiratory Rate: 18 breaths/min, Blood Pressure: 152/81 mmHg. Respiratory normal breathing without difficulty. Psychiatric this patient is able to make decisions and demonstrates good insight into disease process. Alert and Oriented x 3. pleasant and cooperative. General Notes: Upon inspection patient's heel is almost completely closed. With that being said he unfortunately is having issues with his legs bilaterally and will get have to do something to try to get this under control. That was discussed with the patient today. Integumentary (Hair, Skin) Wound #43 status is Open. Original cause of wound was Gradually Appeared. The date acquired was: 05/31/2021. The wound  has been in treatment 8 weeks. The wound is located on the Right Calcaneus. The wound measures 0.1cm length x 0.1cm width x 0.1cm depth; 0.008cm^2 area and 0.001cm^3 volume. There is Fat Layer  (Subcutaneous Tissue) exposed. There is no tunneling or undermining noted. There is a medium amount of serosanguineous drainage noted. There is large (67-100%) pink granulation within the wound bed. There is no necrotic tissue within the wound bed. Other Condition(s) Patient presents with Lymphedema located on the Right Leg. Patient presents with Lymphedema located on the Left Leg. Assessment Active Problems ICD-10 Lymphedema, not elsewhere classified Venous insufficiency (chronic) (peripheral) Non-pressure chronic ulcer of other part of right lower leg with fat layer exposed Non-pressure chronic ulcer of other part of left lower leg with fat layer exposed Pressure ulcer of right heel, stage 2 Procedures Wound #43 Pre-procedure diagnosis of Wound #43 is a Diabetic Wound/Ulcer of the Lower Extremity located on the Right Calcaneus . There was a Four Layer Compression Therapy Procedure by Carlene Coria, RN. Post procedure Diagnosis Wound #43: Same as Pre-Procedure There was a Four Layer Compression Therapy Procedure by Carlene Coria, RN. Post procedure Diagnosis Wound #: Same as Pre-Procedure Plan Follow-up Appointments: Return Appointment in 1 week. Nurse Visit as needed Bathing/ Shower/ Hygiene: May shower with wound dressing protected with water repellent cover or cast protector. No tub bath. Anesthetic (Use 'Patient Medications' Section for Anesthetic Order Entry): Lidocaine applied to wound bed Edema Control - Lymphedema / Segmental Compressive Device / Other: Optional: One layer of unna paste to top of compression wrap (to act as an anchor). 4 Layer Compression System Lymphedema. - bi lat DERRION, TRITZ ([id number]) Elevate, Exercise Daily and Avoid Standing for Long Periods of Time. Elevate legs to the level of the heart and pump ankles as often as possible Elevate leg(s) parallel to the floor when sitting. Compression Pump: Use compression pump on left lower extremity for 60  minutes, twice daily. Compression Pump: Use compression pump on right lower extremity for 60 minutes, twice daily. WOUND #43: - Calcaneus Wound Laterality: Right Cleanser: Soap and Water 1 x Per Week/30 Days Discharge Instructions: Gently cleanse wound with antibacterial soap, rinse and pat dry prior to dressing wounds Topical: Triamcinolone Acetonide Cream, 0.1%, 15 (g) tube 1 x Per Week/30 Days Discharge Instructions: Apply as directed by provider. Primary Dressing: Silvercel Small 2x2 (in/in) 1 x Per Week/30 Days Discharge Instructions: Apply Silvercel Small 2x2 (in/in) as instructed Secondary Dressing: ABD Pad 5x9 (in/in) 1 x Per Week/30 Days Discharge Instructions: Cover with ABD pad Secured With: 62M Medipore H Soft Cloth Surgical Tape, 2x2 (in/yd) 1 x Per Week/30 Days Compression Wrap: Medichoice 4 layer Compression System, 35-40 mmHG 1 x Per Week/30 Days Discharge Instructions: Apply multi-layer wrap as directed. 1. At this point today we had a fairly extensive conversation with the patient about making sure that he keeps his legs elevated, wears his compression, uses his pumps, and takes his fluid pills. Again he really has not been doing much of any of that other than taking his pills. Nonetheless I think that this is why his legs continue to be problematic and become swollen. Especially not using lymphedema pumps which I think he needs to do twice a day every day. He is given the multiple reasons why cannot do it in the past including their dog messing with them but nonetheless I think that these are excuses that need to be addressed not used to promote noncompliance. 2. I am in a  continue currently with the silver alginate dressings to any open weeping areas. Also can continue with the 4-layer compression wrap at this time. 3. With regard to the right heel we will continue with alginate here this is almost completely healed and looks to be doing quite well. We will see patient back  for reevaluation in 1 week here in the clinic. If anything worsens or changes patient will contact our office for additional recommendations. Electronic Signature(s) Signed: 07/28/2021 3:14:46 PM By: Worthy Keeler PA-C Entered By: Worthy Keeler on 07/28/2021 15:14:46 Adam Merritt ([id number]) -------------------------------------------------------------------------------- SuperBill Details Patient Name: Adam Merritt Date of Service: 07/28/2021 Medical Record Number: [id number] Patient Account Number: [id number] Date of Birth/Sex: [birth year] (76 y.o. 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

## 2021-07-29 ENCOUNTER — Other Ambulatory Visit: Payer: Self-pay | Admitting: Internal Medicine

## 2021-07-29 DIAGNOSIS — E876 Hypokalemia: Secondary | ICD-10-CM

## 2021-08-01 ENCOUNTER — Other Ambulatory Visit: Payer: Self-pay | Admitting: Internal Medicine

## 2021-08-01 DIAGNOSIS — E1142 Type 2 diabetes mellitus with diabetic polyneuropathy: Secondary | ICD-10-CM

## 2021-08-01 NOTE — Progress Notes (Signed)
DURRELL, BARAJAS ([id number]) Visit Report for 07/28/2021 Arrival Information Details Patient Name: Adam Merritt, Adam Merritt Date of Service: 07/28/2021 12:30 PM Medical Record Number: [id number] Patient Account Number: [id number] Date of Birth/Sex: 06-Mar-1946 (75 y.o. M) Treating RN: Carlene Coria Primary Care Ruddy Swire: [name] Other Clinician: Referring Akosua Constantine: [name] Treating Cherrish Vitali/Extender: Skipper Cliche in Treatment: 17 Visit Information History Since Last Visit All ordered tests and consults were completed: No Patient Arrived: Wheel Chair Added or deleted any medications: No Arrival Time: 12:36 Any new allergies or adverse reactions: No Accompanied By: wife Had a fall or experienced change in No Transfer Assistance: None activities of daily living that may affect Patient Identification Verified: Yes risk of falls: Secondary Verification Process Completed: Yes Signs or symptoms of abuse/neglect since last visito No Patient Requires Transmission-Based Precautions: No Hospitalized since last visit: No Patient Has Alerts: Yes Implantable device outside of the clinic excluding No Patient Alerts: diabetic type 2 cellular tissue based products placed in the center since last visit: Has Dressing in Place as Prescribed: Yes Has Compression in Place as Prescribed: Yes Pain Present Now: No Electronic Signature(s) Signed: 08/01/2021 8:01:43 AM By: Carlene Coria RN Entered By: Carlene Coria on 07/28/2021 12:42:14 Adam Merritt ([id number]) -------------------------------------------------------------------------------- Clinic Level of Care Assessment Details Patient Name: Adam Merritt Date of Service: 07/28/2021 12:30 PM Medical Record Number: [id number] Patient Account Number: [id number] Date of Birth/Sex: 1946/03/17 (75 y.o. M) Treating RN: Carlene Coria Primary Care Brailon Don: [name] Other Clinician: Referring Jordanne Elsbury: [name] Treating Ozell Ferrera/Extender: Skipper Cliche in Treatment: 17 Clinic Level of Care Assessment Items TOOL 1 Quantity Score []  - Use when EandM and Procedure is performed on INITIAL visit 0 ASSESSMENTS - Nursing Assessment / Reassessment []  - General Physical Exam (combine w/ comprehensive assessment (listed just below) when performed on new 0 pt. evals) []  - 0 Comprehensive Assessment (HX, ROS, Risk Assessments, Wounds Hx, etc.) ASSESSMENTS - Wound and Skin Assessment / Reassessment []  - Dermatologic / Skin Assessment (not related to wound area) 0 ASSESSMENTS - Ostomy and/or Continence Assessment and Care []  - Incontinence Assessment and Management 0 []  - 0 Ostomy Care Assessment and Management (repouching, etc.) PROCESS - Coordination of Care []  - Simple Patient / Family Education for ongoing care 0 []  - 0 Complex (extensive) Patient / Family Education for ongoing care []  - 0 Staff obtains Programmer, systems, Records, Test Results / Process Orders []  - 0 Staff telephones HHA, Nursing Homes / Clarify orders / etc []  - 0 Routine Transfer to another Facility (non-emergent condition) []  - 0 Routine Hospital Admission (non-emergent condition) []  - 0 New Admissions / Biomedical engineer / Ordering NPWT, Apligraf, etc. []  - 0 Emergency Hospital Admission (emergent condition) PROCESS - Special Needs []  - Pediatric / Minor Patient Management 0 []  - 0 Isolation Patient Management []  - 0 Hearing / Language / Visual special needs []  - 0 Assessment of Community assistance (transportation, D/C planning, etc.) []  - 0 Additional assistance / Altered mentation []  - 0 Support Surface(s) Assessment (bed, cushion, seat, etc.) INTERVENTIONS - Miscellaneous []  - External ear exam 0 []  - 0 Patient Transfer (multiple staff / Civil Service fast streamer / Similar devices) []  - 0 Simple Staple / Suture removal (25 or less) []  - 0 Complex Staple / Suture removal (26 or more) []  - 0 Hypo/Hyperglycemic Management (do not check if billed  separately) []  - 0 Ankle / Brachial Index (ABI) - do not check if billed separately Has the patient been seen  at the hospital within the last three years: Yes Total Score: 0 Level Of Care: ____ Adam Merritt ([id number]) Electronic Signature(s) Signed: 08/01/2021 8:01:43 AM By: Carlene Coria RN Entered By: Carlene Coria on 07/28/2021 13:23:43 Adam Merritt ([id number]) -------------------------------------------------------------------------------- Compression Therapy Details Patient Name: Adam Merritt Date of Service: 07/28/2021 12:30 PM Medical Record Number: [id number] Patient Account Number: [id number] Date of Birth/Sex: [birth year] (75 y.o. M) Treating RN: Carlene Coria Primary Care Jhamir Pickup: [name] Other Clinician: Referring Adelynne Joerger: [name] Treating Garrell Flagg/Extender: Skipper Cliche in Treatment: 17 Compression Therapy Performed for Wound Assessment: Wound #43 Right Calcaneus Performed By: Clinician Carlene Coria, RN Compression Type: Four Layer Post Procedure Diagnosis Same as Pre-procedure Electronic Signature(s) Signed: 08/01/2021 8:01:43 AM By: Carlene Coria RN Entered By: Carlene Coria on 07/28/2021 13:23:09 Adam Merritt ([id number]) -------------------------------------------------------------------------------- Compression Therapy Details Patient Name: Adam Merritt Date of Service: 07/28/2021 12:30 PM Medical Record Number: [id number] Patient Account Number: [id number] Date of Birth/Sex: Apr 16, 1946 (75 y.o. M) Treating RN: Carlene Coria Primary Care Deloris Moger: [name] Other Clinician: Referring Rishon Thilges: [name] Treating Jerol Rufener/Extender: Skipper Cliche in Treatment: 17 Compression Therapy Performed for Wound Assessment: NonWound Condition Lymphedema - Left Leg Performed By: Clinician Carlene Coria, RN Compression Type: Four Layer Post Procedure Diagnosis Same as Pre-procedure Electronic Signature(s) Signed: 08/01/2021 8:01:43 AM By: Carlene Coria RN Entered By: Carlene Coria on 07/28/2021 13:23:30 Adam Merritt ([id number]) -------------------------------------------------------------------------------- Encounter Discharge Information Details Patient Name: Adam Merritt Date of Service: 07/28/2021 12:30 PM Medical Record Number: [id number] Patient Account Number: [id number] Date of Birth/Sex: 25-Oct-1945 (75 y.o. M) Treating RN: Carlene Coria Primary Care Blen Ransome: [name] Other Clinician: Referring Emmette Katt: [name] Treating Stepen Prins/Extender: Skipper Cliche in Treatment: 17 Encounter Discharge Information Items Discharge Condition: Stable Ambulatory Status: Wheelchair Discharge Destination: Home Transportation: Private Auto Accompanied By: wife Schedule Follow-up Appointment: Yes Clinical Summary of Care: Patient Declined Electronic Signature(s) Signed: 08/01/2021 8:01:43 AM By: Carlene Coria RN Entered By: Carlene Coria on 07/28/2021 13:24:50 Adam Merritt ([id number]) -------------------------------------------------------------------------------- Lower Extremity Assessment Details Patient Name: Adam Merritt Date of Service: 07/28/2021 12:30 PM Medical Record Number: [id number] Patient Account Number: [id number] Date of Birth/Sex: 09/30/1945 (75 y.o. M) Treating RN: Carlene Coria Primary Care Ticara Waner: [name] Other Clinician: Referring Dawson Albers: [name] Treating Samiya Mervin/Extender: Jeri Cos [name]: 17 Edema Assessment Assessed: [Left: No] [Right: No] Edema: [Left: Yes] [Right: Yes] Calf Left: Right: Point of Measurement: 36 cm From Medial Instep 61 cm 51 cm Ankle Left: Right: Point of Measurement: From Medial Instep 35 cm 30 cm Vascular Assessment Pulses: Dorsalis Pedis Palpable: [Left:Yes] [Right:Yes] Electronic Signature(s) Signed: 08/01/2021 8:01:43 AM By: Carlene Coria RN Entered By: Carlene Coria on 07/28/2021 12:50:26 Adam Merritt  ([id number]) -------------------------------------------------------------------------------- Multi Wound Chart Details Patient Name: Adam Merritt Date of Service: 07/28/2021 12:30 PM Medical Record Number: [id number] Patient Account Number: [id number] Date of Birth/Sex: Apr 30, 1946 (75 y.o. M) Treating RN: Carlene Coria Primary Care Wyn Nettle: [name] Other Clinician: Referring Georgana Romain: [name] Treating Kishaun Erekson/Extender: Skipper Cliche in Treatment: 17 Vital Signs Height(in): 26 Pulse(bpm): 38 Weight(lbs): 300 Blood Pressure(mmHg): 152/81 Body Mass Index(BMI): 48.4 Temperature(F): 98.9 Respiratory Rate(breaths/min): 18 Photos: [N/A:N/A] Wound Location: Right Calcaneus N/A N/A Wounding Event: Gradually Appeared N/A N/A Primary Etiology: Diabetic Wound/Ulcer of the Lower N/A N/A Extremity Comorbid History: Anemia, Lymphedema, Chronic N/A N/A Obstructive Pulmonary Disease (COPD), Sleep Apnea, Congestive Heart Failure, Coronary Artery Disease, Hypertension, Peripheral Venous Disease, Type II Diabetes, Osteoarthritis, Neuropathy, Received Chemotherapy  Date Acquired: 05/31/2021 N/A N/A Weeks of Treatment: 8 N/A N/A Wound Status: Open N/A N/A Wound Recurrence: No N/A N/A Measurements L x W x D (cm) 0.1x0.1x0.1 N/A N/A Area (cm) : 0.008 N/A N/A Volume (cm) : 0.001 N/A N/A % Reduction in Area: 99.50% N/A N/A % Reduction in Volume: 99.70% N/A N/A Classification: Grade 1 N/A N/A Exudate Amount: Medium N/A N/A Exudate Type: Serosanguineous N/A N/A Exudate Color: red, brown N/A N/A Granulation Amount: Large (67-100%) N/A N/A Granulation Quality: Pink N/A N/A Necrotic Amount: None Present (0%) N/A N/A Exposed Structures: Fat Layer (Subcutaneous Tissue): N/A N/A Yes Epithelialization: Large (67-100%) N/A N/A Treatment Notes Electronic Signature(s) Signed: 08/01/2021 8:01:43 AM By: Carlene Coria RN Entered By: Carlene Coria on 07/28/2021 12:59:25 Adam Merritt  ([id number]) -------------------------------------------------------------------------------- [place] Details Patient Name: Adam Merritt Date of Service: 07/28/2021 12:30 PM Medical Record Number: [id number] Patient Account Number: [id number] Date of Birth/Sex: 01-04-1946 (75 y.o. M) Treating RN: Carlene Coria Primary Care Carletha Dawn: [name] Other Clinician: Referring Tahjae Clausing: [name] Treating Myrtle Barnhard/Extender: Skipper Cliche in Treatment: 17 Active Inactive Electronic Signature(s) Signed: 08/01/2021 8:01:43 AM By: Carlene Coria RN Entered By: Carlene Coria on 07/28/2021 12:59:11 Adam Merritt ([id number]) -------------------------------------------------------------------------------- Non-Wound Condition Assessment Details Patient Name: Adam Merritt Date of Service: 07/28/2021 12:30 PM Medical Record Number: [id number] Patient Account Number: [id number] Date of Birth/Gender: 1945/07/26 (75 y.o. M) Treating RN: Carlene Coria Primary Care Physician: [name] Other Clinician: Referring Physician: [name] Treating Physician/Extender: Jeri Cos [name]: 17 Non-Wound Condition: Condition: Lymphedema Location: Leg Side: Right Electronic Signature(s) Signed: 08/01/2021 8:01:43 AM By: Carlene Coria RN Previous Signature: 07/28/2021 12:53:57 PM Version By: Carlene Coria RN Entered By: Carlene Coria on 07/28/2021 12:54:19 Adam Merritt ([id number]) -------------------------------------------------------------------------------- Non-Wound Condition Assessment Details Patient Name: Adam Merritt Date of Service: 07/28/2021 12:30 PM Medical Record Number: [id number] Patient Account Number: [id number] Date of Birth/Gender: 11-27-1945 (75 y.o. M) Treating RN: Carlene Coria Primary Care Physician: [name] Other Clinician: Referring Physician: [name] Treating Physician/Extender: Jeri Cos [name]: 17 Non-Wound  Condition: Condition: Lymphedema Location: Leg Side: Left Electronic Signature(s) Signed: 08/01/2021 8:01:43 AM By: Carlene Coria RN Previous Signature: 07/28/2021 12:54:11 PM Version By: Carlene Coria RN Entered By: Carlene Coria on 07/28/2021 12:54:20 Adam Merritt ([id number]) -------------------------------------------------------------------------------- Pain Assessment Details Patient Name: Adam Merritt Date of Service: 07/28/2021 12:30 PM Medical Record Number: [id number] Patient Account Number: [id number] Date of Birth/Sex: 1945-07-18 (75 y.o. M) Treating RN: Carlene Coria Primary Care Rebbie Lauricella: [name] Other Clinician: Referring Lenna Hagarty: [name] Treating Summerlyn Fickel/Extender: Skipper Cliche in Treatment: 17 Active Problems Location of Pain Severity and Description of Pain Patient Has Paino No Site Locations Pain Management and Medication Current Pain Management: Electronic Signature(s) Signed: 08/01/2021 8:01:43 AM By: Carlene Coria RN Entered By: Carlene Coria on 07/28/2021 12:42:45 Adam Merritt ([id number]) -------------------------------------------------------------------------------- Patient/Caregiver Education Details Patient Name: Adam Merritt Date of Service: 07/28/2021 12:30 PM Medical Record Number: [id number] Patient Account Number: [id number] Date of Birth/Gender: 1946-04-24 (75 y.o. M) Treating RN: Carlene Coria Primary Care Physician: [name] Other Clinician: Referring Physician: [name] Treating Physician/Extender: Skipper Cliche in Treatment: 17 Education Assessment Education Provided To: Patient Education Topics Provided Wound/Skin Impairment: Methods: Explain/Verbal Responses: State content correctly Electronic Signature(s) Signed: 08/01/2021 8:01:43 AM By: Carlene Coria RN Entered By: Carlene Coria on 07/28/2021 13:24:05 Adam Merritt  ([id number]) -------------------------------------------------------------------------------- Wound Assessment Details Patient Name: Adam Merritt Date of Service: 07/28/2021  12:30 PM Medical Record Number: [id number] Patient Account Number: [id number] Date of Birth/Sex: Mar 21, 1946 (75 y.o. M) Treating RN: Carlene Coria Primary Care Amaurie Wandel: [name] Other Clinician: Referring Oris Calmes: [name] Treating Priyal Musquiz/Extender: Jeri Cos [name]: 17 Wound Status Wound Number: 43 Primary Diabetic Wound/Ulcer of the Lower Extremity Etiology: Wound Location: Right Calcaneus Wound Open Wounding Event: Gradually Appeared Status: Date Acquired: 05/31/2021 Comorbid Anemia, Lymphedema, Chronic Obstructive Pulmonary Weeks Of Treatment: 8 History: Disease (COPD), Sleep Apnea, Congestive Heart Failure, Clustered Wound: No Coronary Artery Disease, Hypertension, Peripheral Venous Disease, Type II Diabetes, Osteoarthritis, Neuropathy, Received Chemotherapy Photos Wound Measurements Length: (cm) 0.1 Width: (cm) 0.1 Depth: (cm) 0.1 Area: (cm) 0.008 Volume: (cm) 0.001 % Reduction in Area: 99.5% % Reduction in Volume: 99.7% Epithelialization: Large (67-100%) Tunneling: No Undermining: No Wound Description Classification: Grade 1 Exudate Amount: Medium Exudate Type: Serosanguineous Exudate Color: red, brown Foul Odor After Cleansing: No Slough/Fibrino No Wound Bed Granulation Amount: Large (67-100%) Exposed Structure Granulation Quality: Pink Fat Layer (Subcutaneous Tissue) Exposed: Yes Necrotic Amount: None Present (0%) Treatment Notes Wound #43 (Calcaneus) Wound Laterality: Right Cleanser Soap and Water Discharge Instruction: Gently cleanse wound with antibacterial soap, rinse and pat dry prior to dressing wounds Peri-Wound Care Topical Triamcinolone Acetonide Cream, 0.1%, 15 (g) tube KOLBEE, BOGUSZ ([id number]) Discharge Instruction: Apply as directed by  Avraham Benish. Primary Dressing Silvercel Small 2x2 (in/in) Discharge Instruction: Apply Silvercel Small 2x2 (in/in) as instructed Secondary Dressing ABD Pad 5x9 (in/in) Discharge Instruction: Cover with ABD pad Secured With 64M Medipore H Soft Cloth Surgical Tape, 2x2 (in/yd) Compression Wrap Medichoice 4 layer Compression System, 35-40 mmHG Discharge Instruction: Apply multi-layer wrap as directed. Compression Stockings Add-Ons Electronic Signature(s) Signed: 08/01/2021 8:01:43 AM By: Carlene Coria RN Entered By: Carlene Coria on 07/28/2021 12:48:14 Adam Merritt ([id number]) -------------------------------------------------------------------------------- Vitals Details Patient Name: Adam Merritt Date of Service: 07/28/2021 12:30 PM Medical Record Number: [id number] Patient Account Number: [id number] Date of Birth/Sex: 01/06/1946 (75 y.o. M) Treating RN: Carlene Coria Primary Care Kester Stimpson: [name] Other Clinician: Referring Rakeya Glab: [name] Treating Winford Hehn/Extender: Skipper Cliche in Treatment: 17 Vital Signs Time Taken: 12:42 Temperature (F): 98.9 Height (in): 66 Pulse (bpm): 85 Weight (lbs): 300 Respiratory Rate (breaths/min): 18 Body Mass Index (BMI): 48.4 Blood Pressure (mmHg): 152/81 Reference Range: 80 - 120 mg / dl Electronic Signature(s) Signed: 08/01/2021 8:01:43 AM By: Carlene Coria RN Entered By: Carlene Coria on 07/28/2021 12:42:33

## 2021-08-04 ENCOUNTER — Other Ambulatory Visit: Payer: Self-pay

## 2021-08-04 ENCOUNTER — Encounter: Payer: Medicare PPO | Attending: Physician Assistant | Admitting: Physician Assistant

## 2021-08-04 DIAGNOSIS — L97812 Non-pressure chronic ulcer of other part of right lower leg with fat layer exposed: Secondary | ICD-10-CM | POA: Insufficient documentation

## 2021-08-04 DIAGNOSIS — I872 Venous insufficiency (chronic) (peripheral): Secondary | ICD-10-CM | POA: Insufficient documentation

## 2021-08-04 DIAGNOSIS — E11621 Type 2 diabetes mellitus with foot ulcer: Secondary | ICD-10-CM | POA: Diagnosis not present

## 2021-08-04 DIAGNOSIS — I890 Lymphedema, not elsewhere classified: Secondary | ICD-10-CM | POA: Diagnosis not present

## 2021-08-04 DIAGNOSIS — L89612 Pressure ulcer of right heel, stage 2: Secondary | ICD-10-CM | POA: Insufficient documentation

## 2021-08-04 DIAGNOSIS — L97411 Non-pressure chronic ulcer of right heel and midfoot limited to breakdown of skin: Secondary | ICD-10-CM | POA: Diagnosis not present

## 2021-08-04 DIAGNOSIS — L97822 Non-pressure chronic ulcer of other part of left lower leg with fat layer exposed: Secondary | ICD-10-CM | POA: Insufficient documentation

## 2021-08-04 NOTE — Progress Notes (Addendum)
Adam Merritt ([id number]) Visit Report for 08/04/2021 Arrival Information Details Patient Name: Adam Merritt, Adam Merritt Date of Service: 08/04/2021 3:30 PM Medical Record Number: [id number] Patient Account Number: [id number] Date of Birth/Sex: 20-Nov-1945 (76 y.o. M) Treating RN: Cornell Barman Primary Care Burkley Dech: Jonetta Osgood Other Clinician: Referring Chalise Pe: [name] Treating Jerrold Haskell/Extender: Skipper Cliche in Treatment: 18 Visit Information History Since Last Visit Added or deleted any medications: No Patient Arrived: Wheel Chair Has Dressing in Place as Prescribed: Yes Arrival Time: 15:25 Has Compression in Place as Prescribed: Yes Accompanied By: wife Pain Present Now: No Transfer Assistance: None Patient Identification Verified: Yes Secondary Verification Process Completed: Yes Patient Requires Transmission-Based Precautions: No Patient Has Alerts: Yes Patient Alerts: diabetic type 2 Electronic Signature(s) Signed: 08/05/2021 5:12:48 PM By: Gretta Cool, BSN, RN, CWS, Kim RN, BSN Entered By: Gretta Cool, BSN, RN, CWS, Kim on 08/04/2021 15:26:15 Adam Merritt ([id number]) -------------------------------------------------------------------------------- Encounter Discharge Information Details Patient Name: Adam Merritt Date of Service: 08/04/2021 3:30 PM Medical Record Number: [id number] Patient Account Number: [id number] Date of Birth/Sex: 02/14/1946 (76 y.o. M) Treating RN: Cornell Barman Primary Care Whitman Meinhardt: Jonetta Osgood Other Clinician: Referring Jeanne Terrance: [name] Treating Axil Copeman/Extender: Skipper Cliche in Treatment: 18 Encounter Discharge Information Items Post Procedure Vitals Discharge Condition: Stable Unable to obtain vitals Reason: limited time Ambulatory Status: Wheelchair Discharge Destination: Home Transportation: Private Auto Accompanied By: wife Schedule Follow-up Appointment: Yes Clinical Summary of Care: Electronic Signature(s) Signed: 08/05/2021  5:12:48 PM By: Gretta Cool, BSN, RN, CWS, Kim RN, BSN Entered By: Gretta Cool, BSN, RN, CWS, Kim on 08/04/2021 17:03:00 Adam Merritt ([id number]) -------------------------------------------------------------------------------- Lower Extremity Assessment Details Patient Name: Adam Merritt Date of Service: 08/04/2021 3:30 PM Medical Record Number: [id number] Patient Account Number: [id number] Date of Birth/Sex: 1946-04-02 (76 y.o. M) Treating RN: Cornell Barman Primary Care Vyolet Sakuma: Jonetta Osgood Other Clinician: Referring Alleyne Lac: [name] Treating Keta [place]/Extender: Skipper Cliche in Treatment: 18 Edema Assessment Assessed: [Left: No] [Right: No] [Left: Edema] [Right: :] Calf Left: Right: Point of Measurement: 32 cm From Medial Instep 53 cm 44 cm Ankle Left: Right: Point of Measurement: 12 cm From Medial Instep 35.4 cm 28.6 cm Vascular Assessment Pulses: Dorsalis Pedis Palpable: [Left:Yes] [Right:Yes] Electronic Signature(s) Signed: 08/05/2021 5:12:48 PM By: Gretta Cool, BSN, RN, CWS, Kim RN, BSN Entered By: Gretta Cool, BSN, RN, CWS, Kim on 08/04/2021 15:45:42 Adam Merritt ([id number]) -------------------------------------------------------------------------------- Multi Wound Chart Details Patient Name: Adam Merritt Date of Service: 08/04/2021 3:30 PM Medical Record Number: [id number] Patient Account Number: [id number] Date of Birth/Sex: Nov 12, 1945 (76 y.o. M) Treating RN: Cornell Barman Primary Care Danyel Tobey: Jonetta Osgood Other Clinician: Referring Majestic Molony: [name] Treating Annelie Boak/Extender: Skipper Cliche in Treatment: 18 Vital Signs Height(in): 61 Pulse(bpm): 63 Weight(lbs): 300 Blood Pressure(mmHg): 159/85 Body Mass Index(BMI): 48.4 Temperature(F): 98.4 Respiratory Rate(breaths/min): 18 Photos: [N/A:N/A] Wound Location: Right Calcaneus N/A N/A Wounding Event: Gradually Appeared N/A N/A Primary Etiology: Diabetic Wound/Ulcer of the Lower N/A  N/A Extremity Comorbid History: Anemia, Lymphedema, Chronic N/A N/A Obstructive Pulmonary Disease (COPD), Sleep Apnea, Congestive Heart Failure, Coronary Artery Disease, Hypertension, Peripheral Venous Disease, Type II Diabetes, Osteoarthritis, Neuropathy, Received Chemotherapy Date Acquired: 05/31/2021 N/A N/A Weeks of Treatment: 9 N/A N/A Wound Status: Open N/A N/A Wound Recurrence: No N/A N/A Measurements L x W x D (cm) 0.1x0.1x0.4 N/A N/A Area (cm) : 0.008 N/A N/A Volume (cm) : 0.003 N/A N/A % Reduction in Area: 99.50% N/A N/A % Reduction in Volume: 99.00% N/A N/A Starting Position 1 (o'clock): 12 Ending Position 1 (o'clock):  12 Maximum Distance 1 (cm): 0.2 Undermining: Yes N/A N/A Classification: Grade 1 N/A N/A Exudate Amount: Medium N/A N/A Exudate Type: Sanguinous N/A N/A Exudate Color: red N/A N/A Wound Margin: Well defined, not attached N/A N/A Granulation Amount: Large (67-100%) N/A N/A Granulation Quality: Pink N/A N/A BREVAN, LUBERTO ([id number]) Necrotic Amount: None Present (0%) N/A N/A Exposed Structures: Fat Layer (Subcutaneous Tissue): N/A N/A Yes Epithelialization: Large (67-100%) N/A N/A Debridement: Debridement - Excisional N/A N/A Pre-procedure Verification/Time 04:15 N/A N/A Out Taken: Tissue Debrided: Subcutaneous, Slough N/A N/A Level: Skin/Subcutaneous Tissue N/A N/A Debridement Area (sq cm): 0.04 N/A N/A Instrument: Curette N/A N/A Bleeding: Minimum N/A N/A Hemostasis Achieved: Pressure N/A N/A Debridement Treatment Procedure was tolerated well N/A N/A Response: Post Debridement 0.1x0.1x0.4 N/A N/A Measurements L x W x D (cm) Post Debridement Volume: 0.003 N/A N/A (cm) Procedures Performed: Debridement N/A N/A Treatment Notes Electronic Signature(s) Signed: 08/05/2021 5:12:48 PM By: Gretta Cool, BSN, RN, CWS, Kim RN, BSN Entered By: Gretta Cool, BSN, RN, CWS, Kim on 08/04/2021 16:25:21 Adam Merritt  ([id number]) -------------------------------------------------------------------------------- [place] Details Patient Name: Adam Merritt Date of Service: 08/04/2021 3:30 PM Medical Record Number: [id number] Patient Account Number: [id number] Date of Birth/Sex: 04/22/1946 (76 y.o. M) Treating RN: Cornell Barman Primary Care Nelani Schmelzle: Jonetta Osgood Other Clinician: Referring Jullian Previti: [name] Treating Kambrey Hagger/Extender: Skipper Cliche in Treatment: 18 Active Inactive Venous Leg Ulcer Nursing Diagnoses: Actual venous Insuffiency (use after diagnosis is confirmed) Goals: Patient/caregiver will verbalize understanding of disease process and disease management Date Initiated: 08/04/2021 Target Resolution Date: 08/18/2021 Goal Status: Active Verify adequate tissue perfusion prior to therapeutic compression application Date Initiated: 08/04/2021 Target Resolution Date: 08/18/2021 Goal Status: Active Interventions: Assess peripheral edema status every visit. Compression as ordered Notes: Electronic Signature(s) Signed: 08/05/2021 5:12:48 PM By: Gretta Cool, BSN, RN, CWS, Kim RN, BSN Entered By: Gretta Cool, BSN, RN, CWS, Kim on 08/04/2021 16:20:27 Adam Merritt ([id number]) -------------------------------------------------------------------------------- Pain Assessment Details Patient Name: Adam Merritt Date of Service: 08/04/2021 3:30 PM Medical Record Number: [id number] Patient Account Number: [id number] Date of Birth/Sex: 1945/12/23 (76 y.o. M) Treating RN: Cornell Barman Primary Care Naimah Yingst: Jonetta Osgood Other Clinician: Referring Pearson Picou: [name] Treating Cleven Jansma/Extender: Skipper Cliche in Treatment: 18 Active Problems Location of Pain Severity and Description of Pain Patient Has Paino No Site Locations Pain Management and Medication Current Pain Management: Notes Patient denies pain at this time. Electronic Signature(s) Signed: 08/05/2021 5:12:48 PM  By: Gretta Cool, BSN, RN, CWS, Kim RN, BSN Entered By: Gretta Cool, BSN, RN, CWS, Kim on 08/04/2021 15:27:24 Adam Merritt ([id number]) -------------------------------------------------------------------------------- Patient/Caregiver Education Details Patient Name: Adam Merritt Date of Service: 08/04/2021 3:30 PM Medical Record Number: [id number] Patient Account Number: [id number] Date of Birth/Gender: [birth year] (76 y.o. M) Treating RN: Cornell Barman Primary Care Physician: Jonetta Osgood Other Clinician: Referring Physician: [name] Treating Physician/Extender: Skipper Cliche in Treatment: 18 Education Assessment Education Provided To: Patient Education Topics Provided Venous: Handouts: Controlling Swelling with Multilayered Compression Wraps Methods: Demonstration, Explain/Verbal Responses: State content correctly Electronic Signature(s) Signed: 08/05/2021 5:12:48 PM By: Gretta Cool, BSN, RN, CWS, Kim RN, BSN Entered By: Gretta Cool, BSN, RN, CWS, Kim on 08/04/2021 17:02:04 Adam Merritt ([id number]) -------------------------------------------------------------------------------- Wound Assessment Details Patient Name: Adam Merritt Date of Service: 08/04/2021 3:30 PM Medical Record Number: [id number] Patient Account Number: [id number] Date of Birth/Sex: August 09, 1945 (76 y.o. M) Treating RN: Cornell Barman Primary Care Sallyann Kinnaird: Jonetta Osgood Other Clinician: Referring Gedeon Brandow: [name] Treating Kreed Kauffman/Extender: Skipper Cliche in Treatment: [phone]  Wound Status Wound Number: [id number] Primary Diabetic Wound/Ulcer of the Lower Extremity Etiology: Wound Location: Right Calcaneus Wound Open Wounding Event: Gradually Appeared Status: Date Acquired: 05/31/2021 Comorbid Anemia, Lymphedema, Chronic Obstructive Pulmonary Weeks Of Treatment: 9 History: Disease (COPD), Sleep Apnea, Congestive Heart Failure, Clustered Wound: No Coronary Artery Disease, Hypertension, Peripheral Venous Disease, Type  II Diabetes, Osteoarthritis, Neuropathy, Received Chemotherapy Photos Wound Measurements Length: (cm) 0.1 Width: (cm) 0.1 Depth: (cm) 0.4 Area: (cm) 0.008 Volume: (cm) 0.003 % Reduction in Area: 99.5% % Reduction in Volume: 99% Epithelialization: Large (67-100%) Undermining: Yes Starting Position (o'clock): 12 Ending Position (o'clock): 12 Maximum Distance: (cm) 0.2 Wound Description Classification: Grade 1 Wound Margin: Well defined, not attached Exudate Amount: Medium Exudate Type: Sanguinous Exudate Color: red Foul Odor After Cleansing: No Slough/Fibrino No Wound Bed Granulation Amount: Large (67-100%) Exposed Structure Granulation Quality: Pink Fat Layer (Subcutaneous Tissue) Exposed: Yes Necrotic Amount: None Present (0%) Treatment Notes Wound #43 (Calcaneus) Wound Laterality: Right Cleanser Soap and Water Discharge Instruction: Gently cleanse wound with antibacterial soap, rinse and pat dry prior to dressing wounds BRIAN, ZEITLIN ([id number]) Peri-Wound Care Topical Triamcinolone Acetonide Cream, 0.1%, 15 (g) tube Discharge Instruction: Apply as directed by Taqwa Deem. Primary Dressing Silvercel Small 2x2 (in/in) Discharge Instruction: Apply Silvercel Small 2x2 (in/in) as instructed Secondary Dressing ABD Pad 5x9 (in/in) Discharge Instruction: Cover with ABD pad Secured With 3M Medipore H Soft Cloth Surgical Tape, 2x2 (in/yd) Compression Wrap Medichoice 4 layer Compression System, 35-40 mmHG Discharge Instruction: Apply multi-layer wrap as directed. Compression Stockings Environmental education officer) Signed: 08/05/2021 5:12:48 PM By: Gretta Cool, BSN, RN, CWS, Kim RN, BSN Entered By: Gretta Cool, BSN, RN, CWS, Kim on 08/04/2021 16:22:50 Adam Merritt ([id number]) -------------------------------------------------------------------------------- [place] Details Patient Name: Adam Merritt Date of Service: 08/04/2021 3:30 PM Medical Record Number: [id number] Patient  Account Number: [id number] Date of Birth/Sex: [birth year] (76 y.o. M) Treating RN: Cornell Barman Primary Care Colleen Donahoe: Jonetta Osgood Other Clinician: Referring Israel Werts: [name] Treating Zoraida Havrilla/Extender: Skipper Cliche in Treatment: 18 Vital Signs Time Taken: 15:26 Temperature (F): 98.4 Height (in): 66 Pulse (bpm): 66 Weight (lbs): 300 Respiratory Rate (breaths/min): 18 Body Mass Index (BMI): 48.4 Blood Pressure (mmHg): 159/85 Reference Range: 80 - 120 mg / dl Airway Pulse Oximetry (%): 98 Notes Patient states he has not taken BP medication today. Pulse ox 98% on room air. Electronic Signature(s) Signed: 08/05/2021 5:12:48 PM By: Gretta Cool, BSN, RN, CWS, Kim RN, BSN Entered By: Gretta Cool, BSN, RN, CWS, Kim on 08/04/2021 15:29:11

## 2021-08-04 NOTE — Progress Notes (Addendum)
Adam Merritt, Adam Merritt ([id number]) Visit Report for 08/04/2021 Chief Complaint Document Details Patient Name: Adam Merritt, Adam Merritt Date of Service: 08/04/2021 3:30 PM Medical Record Number: [id number] Patient Account Number: [id number] Date of Birth/Sex: 15-Nov-1945 (76 y.o. M) Treating RN: Primary Care Provider: Jonetta Osgood Other Clinician: Referring Provider: [name] Treating Provider/Extender: Skipper Cliche in Treatment: 18 Information Obtained from: Patient Chief Complaint Bilateral LE edema and weeping ulcers Electronic Signature(s) Signed: 08/04/2021 3:20:23 PM By: Worthy Keeler PA-C Entered By: Worthy Keeler on 08/04/2021 15:20:22 Adam Merritt, Adam Merritt ([id number]) -------------------------------------------------------------------------------- Debridement Details Patient Name: Adam Merritt Date of Service: 08/04/2021 3:30 PM Medical Record Number: [id number] Patient Account Number: [id number] Date of Birth/Sex: November 17, 1945 (76 y.o. M) Treating RN: Cornell Barman Primary Care Provider: Jonetta Osgood Other Clinician: Referring Provider: [name] Treating Provider/Extender: Skipper Cliche in Treatment: 18 Debridement Performed for Wound #43 Right Calcaneus Assessment: Performed By: Physician Tommie Sams., PA-C Debridement Type: Debridement Severity of Tissue Pre Debridement: Fat layer exposed Level of Consciousness (Pre- Awake and Alert procedure): Pre-procedure Verification/Time Out Yes - 04:15 Taken: Total Area Debrided (L x W): 0.2 (cm) x 0.2 (cm) = 0.04 (cm) Tissue and other material Viable, Non-Viable, Slough, Subcutaneous, Slough debrided: Level: Skin/Subcutaneous Tissue Debridement Description: Excisional Instrument: Curette Bleeding: Minimum Hemostasis Achieved: Pressure Response to Treatment: Procedure was tolerated well Level of Consciousness (Post- Awake and Alert procedure): Post Debridement Measurements of Total Wound Length: (cm) 0.1 Width: (cm)  0.1 Depth: (cm) 0.4 Volume: (cm) 0.003 Character of Wound/Ulcer Post Debridement: Stable Severity of Tissue Post Debridement: Limited to breakdown of skin Post Procedure Diagnosis Same as Pre-procedure Electronic Signature(s) Signed: 08/04/2021 5:22:07 PM By: Worthy Keeler PA-C Signed: 08/05/2021 5:12:48 PM By: Gretta Cool, BSN, RN, CWS, Kim RN, BSN Entered By: Gretta Cool, BSN, RN, CWS, Kim on 08/04/2021 16:25:07 Adam Merritt ([id number]) -------------------------------------------------------------------------------- HPI Details Patient Name: Adam Merritt Date of Service: 08/04/2021 3:30 PM Medical Record Number: [id number] Patient Account Number: [id number] Date of Birth/Sex: 10-Jan-1946 (76 y.o. M) Treating RN: Primary Care Provider: Jonetta Osgood Other Clinician: Referring Provider: [name] Treating Provider/Extender: Skipper Cliche in Treatment: 18 History of Present Illness HPI Description: 10/18/17-He is here for initial evaluation of bilateral lower extremity ulcerations in the presence of venous insufficiency and lymphedema. He has been seen by vascular medicine in the past, Dr. Lucky Cowboy, last seen in 2016. He does have a history of abnormal ABIs, which is to be expected given his lymphedema and venous insufficiency. According to Epic, it appears that all attempts for arterial evaluation and/or angiography were not follow through with by patient. He does have a history of being seen in lymphedema clinic in 2018, stopped going approximately 6 months ago stating "it didn't do any good". He does not have lymphedema pumps, he does not have custom fit compression wrap/stockings. He is diabetic and his recent A1c last month was 7.6. He admits to chronic bilateral lower extremity pain, no change in pain since blister and ulceration development. He is currently being treated with Levaquin for bronchitis. He has home health and we will continue. 10/25/17-He is here in follow-up evaluation for  bilateral lower extremity ulcerationssubtle he remains on Levaquin for bronchitis. Right lower extremity with no evidence of drainage or ulceration, persistent left lower extremity ulceration. He states that home health has not been out since his appointment. He went to [facility] on Tuesday, studies revealed: RIGHT ABI 0.9, TBI 0.6 LEFT ABI 1.1, TBI 0.6 with triphasic flow  bilaterally. We will continue his same treatment plan. He has been educated on compression therapy and need for elevation. He will benefit from lymphedema pumps 11/01/17-He is here in follow-up evaluation for left lower extremity ulcer. The right lower extremity remains healed. He has home health services, but they have not been out to see the patient for 2-3 weeks. He states it home health physical therapy changed his dressing yesterday after therapy; he placed Ace wrap compression. We are still waiting for lymphedema pumps, reordered d/t need for company change. 11/08/17-He is here in follow-up evaluation for left lower extremity ulcer. It is improved. Edema is significantly improved with compression therapy. We will continue with same treatment plan and he will follow-up next week. No word regarding lymphedema pumps 11/15/17-He is here in follow-up evaluation for left lower extremity ulcer. He is healed and will be discharged from wound care services. I have reached out to [facility] regarding his lymphedema pumps. They have been unable to reach the patient; the contact number they had with the patient's wife's cell phone and she has not answered any unrecognized calls. Contact should be made today, trial planned for next week; [facility] will continue to follow 11/27/17 on evaluation today patient has multiple blistered areas over the right lower extremity his left lower extremity appears to be doing okay. These blistered areas show signs of no infection which is great news. With that being said he did  have some necrotic skin overlying which was mechanically debrided away with saline and gauze today without complication. Overall post debridement the wounds appear to be doing better but in general his swelling seems to be increased. This is obviously not good news. I think this is what has given rise to the blisters. 12/04/17 on evaluation today patient presents for follow-up concerning his bilateral lower extremity edema in the right lower extremity ulcers. He has been tolerating the dressing changes without complication. With that being said he has had no real issues with the wraps which is also good news. Overall I'm pleased with the progress he's been making. 12/11/17 on evaluation today patient appears to be doing rather well in regard to his right lateral lower extremity ulcer. He's been tolerating the dressing changes without complication. Fortunately there does not appear to be any evidence of infection at this time. Overall I'm pleased with the progress that is being made. Unfortunately he has been in the hospital due to having what sounds to be a stomach virus/flu fortunately that is starting to get better. 12/18/17 on evaluation today patient actually appears to be doing very well in regard to his bilateral lower extremities the swelling is under fairly good control his lymphedema pumps are still not up and running quite yet. With that being said he does have several areas of opening noted as far as wounds are concerned mainly over the left lower extremity. With that being said I do believe once he gets lymphedema pumps this would least help you mention some the fluid and preventing this from occurring. Hopefully that will be set up soon sleeves are Artie in place at his home he just waiting for the machine. 12/25/17 on evaluation today patient actually appears to be doing excellent in fact all of his ulcers appear to have resolved his legs appear very well. I do think he needs compression  stockings we have discussed this and they are actually going to go to [place] today to elastic therapy to get this fitted for him. I think  that is definitely a good thing to do. Readmission: 04/09/18 upon evaluation today this patient is seen for readmission due to bilateral lower extremity lymphedema. He has significant swelling of his extremities especially on the left although the right is also swollen he has weeping from both sides. There are no obvious open wounds at this point. Fortunately he has been doing fairly well for quite a bit of time since I last saw him. Nonetheless unfortunately this seems to have reopened and is giving quite a bit of trouble. He states this began about a week ago when he first called [place] to get in to be seen. No fevers, chills, nausea, or vomiting noted at this time. He has not been using his lymphedema pumps due to the fact that they won't fit on his leg at this point likewise is also not been using his compression for essentially the same reason. 04/16/18 upon evaluation today patient actually appears to be doing a little better in regard to the fluid in his bilateral lower extremities. With that being said he's had three falls since I saw him last week. He also states that he's been feeling very poorly. I was concerned last week and feel like that the concern is still there as far as the congestion in his chest is concerned he seems to be breathing about the same as last week but again he states he's very weak he's not even able to walk further than from the chair to the door. His wife had to buy a wheelchair just to be able to get them out of the house to get to the appointment today. This has me very concerned. 04/23/18 on evaluation today patient actually appears to be doing much better than last week's evaluation. At that time actually had to transport him to the ER via EMS and he subsequently was admitted for acute pulmonary edema, acute renal failure, and  acute congestive heart failure. Fortunately he is doing much better. Apparently they did dialyze him and were able to take off roughly 35 pounds of fluid. Nonetheless he is feeling much better both in regard to his breathing and he's able to get around much better at this time compared to previous. Overall I'm very Adam Merritt, Adam Merritt ([id number]) happy with how things are at this time. There does not appear to be any evidence of infection currently. No fevers, chills, nausea, or vomiting noted at this time. 04/30/2018 patient seen today for follow-up and management of bilateral lower extremity lymphedema. He did express being more sad today than usual due to the recent loss of his dog. He states that he has been compliant with using the lymphedema pumps. However he does admit a minute over the last 2-3 days he has not been using the pumps due to the recent loss of his dog. At this time there is no drainage or open wounds to his lower extremities. The left leg edema is measuring smaller today. Still has a significant amount of edema on bilateral lower extremities With dry flaky skin. He will be referred to the lymphedema clinic for further management. Will continue 3 layer compression wraps and follow-up in 1-2 weeks.Denies any pain, fever, chairs recently. No recent falls or injuries reported during this visit. 05/07/18 on evaluation today patient actually appears to be doing very well in regard to his lower extremities in general all things considered. With that being said he is having some pain in the legs just due to the amount of swelling. He  does have an area where he had a blister on the left lateral lower extremity this is open at this point other than that there's nothing else weeping at this time. 05/14/18 on evaluation today patient actually appears to be doing excellent all things considered in regard to his lower extremities. He still has a couple areas of weeping on each leg which has  continued to be the issue for him. He does have an appointment with the lymphedema clinic although this isn't until February 2020. That was the earliest they had. In the meantime he has continued to tolerate the compression wraps without complication. 05/28/18 on evaluation today patient actually appears to be doing more poorly in regard to his left lower extremity where he has a wound open at this point. He also had a fall where he subsequently injured his right great toe which has led to an open wound at the site unfortunately. He has been tolerating the dressing changes without complication in general as far as the wraps are concerned that he has not been putting any dressing on the left 1st toe ulcer site. 06/11/18 on evaluation today patient appears to be doing much worse in regard to his bilateral lower extremity ulcers. He has been tolerating the dressing changes without complication although his legs have not been wrapped more recently. Overall I am not very pleased with the way his legs appear. I do believe he needs to be back in compression wraps he still has not received his compression wraps from the [facility] hospital as of yet. 06/18/18 on evaluation today patient actually appears to be doing significantly better than last time I saw him. He has been tolerating the compression wraps without complication in the circumferential ulcers especially appear to be doing much better. His toe ulcer on the right in regard to the great toe is better although not as good as the legs in my pinion. No fevers chills noted 07/02/18 on evaluation today patient appears to be doing much better in regard to his lower extremity ulcers. Unfortunately since I last saw him he's had the distal portion of his right great toe if he dated it sounds as if this actually went downhill very quickly. I had only seen him a few days prior and the toe did not appear to be infected at that point subsequently became infected very  rapidly and it was decided by the surgeon that the distal portion of the toe needed to be removed. The patient seems to be doing well in this regard he tells me. With that being said his lower extremities are doing better from the standpoint of the wounds although he is significantly swollen at this point. 07/09/18 on evaluation today patient appears to be doing better in regard to the wounds on his lower extremities. In fact everything is almost completely healed he is just a small area on the left posterior lower extremity that is open at this point. 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 With that being said he needs some kind of compression it does sound like he may not be wearing his compression, that is the wraps, during the entire time between when he's here visit to visit. Apparently his wife took the current one off because it began to "fall apart". 07/16/18 on evaluation today patient appears to be  extremely swollen especially in regard to his left lower extremity unfortunately. He also has a new skin tear over the left lower extremity and there's a smaller area on the right lower extremity as well. Unfortunately this seems to be due in part to blistering and fluid buildup in his leg. He did get the reduction wraps that were ordered by the [facility] hospital for him to go to lymphedema clinic. With that being said his wounds on the legs have not healed to the point to where they would likely accept them as a patient lymphedema clinic currently. We need to try to get this to heal. With that being said he's been taking his wraps off which is not doing him any favors at this point. In fact this is probably quite counterproductive compared to what needs to occur. We will likely need to increase to a four layer  compression wrap and continue to also utilize elevation and he has to keep the wraps on not take them off as he's been doing currently hasn't had a wrap on since Saturday. 07/23/18 on evaluation today patient appears to be doing much better in regard to his bilateral lower extremities. In fact his left lower extremity which was the largest is actually 15 cm smaller today compared to what it was last time he was here in our clinic. This is obviously good news after just one week. Nonetheless the differences he actually kept the wraps on during the entire week this time. That's not typical for him. I do believe he understands a little bit better now the severity of the situation and why it's important for him to keep these wraps on. 07/30/18 on evaluation today patient actually appears to be doing rather well in regard to his lower extremities. His legs are much smaller than they have been in the past and he actually has only one very small rudder superficial region remaining that is not closed on the left lateral/posterior lower extremity even this is almost completely close. I do believe likely next week he will be healed without any complications. I do think we need to continue the wraps however this seems to be beneficial for him. I also think it may be a good time for [place] to go ahead and see about getting the appointment with the lymphedema clinic which is supposed to be made for him in order to keep this moving along and hopefully get them into compression wraps that will in the end help him to remain healed. 08/06/18 on evaluation today patient actually appears to be doing very well in regard as bilateral lower extremities. In fact his wounds appear to be completely healed at this time. 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 He is in the process of getting appointment with the lymphedema clinic we have made this referral were just waiting  to hear back on the schedule time. We need to follow up on that today as well. 08/13/18 on evaluation today patient actually appears to be doing very well in regard to his bilateral lower extremities there are no open wounds at this point. We are gonna go ahead and see about ordering the Velcro compression wraps for him had a discussion with them about [place] doing it versus the [place] they feel like they can definitely afford going ahead and get the wraps themselves and they would prefer to try to avoid having to go to the lymphedema clinic if it all possible  which I completely understand. As long as he has good compression I'm okay either way. 3/17//20 on evaluation today patient actually appears to be doing well in regard to his bilateral lower extremity ulcers. He has been tolerating the dressing changes without complication specifically the compression wraps. Overall is had no issues my fingers finding that I see at this point is that he is having trouble with constipation. He tells me he has not been able to go to the bathroom for about six days. He's taken to over-the- counter oral laxatives unfortunately this is not helping. He has not contacted his doctor. Adam Merritt, Adam Merritt ([id number]) 08/27/18 on evaluation today patient appears to be doing fairly well in regard to his lower extremities at this point. There does not appear to be any new altars is swelling is very well controlled. We are still waiting for his Velcro compression wraps to arrive that should be sometime in the next week is Artie sent the check for these. 09/03/18 on evaluation today patient appears to be doing excellent in regard to his bilateral lower extremities which he shows no signs of wound openings in regard to this point. He does have his Velcro compression wraps which did arrive in the mail since I saw him last week. Overall he is doing excellent in my pinion. 09/13/18 on evaluation today patient appears to be doing very well  currently in regard to the overall appearance of his bilateral lower extremities although he's a little bit more swollen than last time we saw him. At that point he been discharged without any open wounds. Nonetheless he has a small open wound on the posterior left lower extremity with some evidence of cellulitis noted as well. Fortunately I feel like he has made good progress overall with regard to his lower extremities from were things used to be. 09/20/18 on evaluation today patient actually appears to be doing much better. The erythematous lower extremity is improving wound itself which is still open appears to be doing much better as far as but appearance as well as pain is concerned overall very pleased in this regard. There's no signs of active infection at this time. 09/27/18 on evaluation today patient's wounds on the lower extremity actually appear to be doing fairly well at this time which is good news. There is no evidence of active infection currently and again is just as left lower extremity were there any wounds at this point anyway. I believe they may be completely healed but again I'm not 100% sure based on evaluation today. I think one more week of observation would likely be a good idea. 10/04/18 on evaluation today patient actually appears to be doing excellent in regard to the left lower extremity which actually appears to be completely healed as of today. Unfortunately he's been having issues with his right lower extremity have a new wound that has opened. Fortunately there's no evidence of active infection at this time which is good news. 10/10/18 upon evaluation today patient actually appears to be doing a little bit worse with the new open area on his right posterior lower extremity. He's been tolerating the dressing changes without complication. Right now we been using his compression wraps although I think we may need to switch back to actually performing bilateral compression  wraps are in the clinic. No fevers, chills, nausea, or vomiting noted at this time. 10/17/18 on evaluation today patient actually appears to be doing quite well in regard to his bilateral lower extremity ulcers.  He has been tolerating the reps without complication although he would prefer not to rewrap his legs as of today. Fortunately there's no signs of active infection which is good news. No fevers, chills, nausea, or vomiting noted at this time. 10/24/18 on evaluation today patient appears to be doing very well in regard to his lower extremities. His right lower extremity is shown signs of healing and his left lower Trinity though not healed appears to be improving which is excellent news. Overall very pleased with how things seem to be progressing at this time. The patient is likewise happy to hear this. His Velcro compression wraps however have not been put on properly will gonna show his wife how to do that properly today. 10/31/18 on evaluation today patient appears to be doing more poorly in regard to his left lower extremity in particular although both lower extremities actually are showing some signs of being worse than my previous evaluation. Unfortunately I'm just not sure that his compression stockings even with the use of the compression/lymphedema pumps seem to be controlling this well. Upon further questioning he tells me that he also is not able to lie flat in the bed due to his congestive heart failure and difficulty breathing. For that reason he sleeps in his recliner. To make matters worse his recliner also cannot even hold his legs up so instead of even being somewhat elevated they pretty much hang down to the floor. This is the way he sleeps each night which is definitely counterproductive to everything else that were attempting to do from the standpoint of controlling his fluid. Nonetheless I think that he potentially could benefit from a hospital bed although this would be  something that his primary care provider would likely have to order since anything that is order on our side has to be directly related to wound care and again the hospital bed is not necessarily a direct relation to although I think it does contribute to his overall wound status on his lower extremities. 11/07/18 on evaluation today patient appears to be doing better in regard to his bilateral lower extremity. He's been tolerating the dressing changes without complication. We did in the interim since I last saw him switch to just using extras orbiting new alginate and that seems to have done much better for him. I'm very pleased with the overall progress that is made. 11/14/18 on evaluation today patient appears to be redoing rather well in regard to his left lower extremity ulcers which are the only ones remaining at this point. Fortunately there's no signs of active infection at this time which is good news. Overall been very pleased with how things seem to be progressing currently. No fevers, chills, nausea, or vomiting noted at this time. 11/20/17 on evaluation today patient actually appears to be doing quite well in regard to his lower extremities on the left on the right he has several blisters that showed up although there's some question about whether or not he has had his broker compression wraps on like he was supposed to or not. Fortunately there's no signs of active infection at this time which is good news. Unfortunately though he is doing well from the standpoint of the left leg the right leg is not doing as well again this is when we did not wrap last week. 11/28/18 on evaluation today patient appears to be doing well in regard to his bilateral lower extremities is left appears to be healed is right is not healed  but is very close to being so. Overall very pleased with how things seem to be progressing. Patient is likewise happy that things are doing well. 12/09/18 on evaluation today  patient actually appears to be completely healed which is excellent news. He actually seems to be doing well in regard to the swelling of the bilateral lower extremities which is also great news. Overall very pleased with how things seem to be progressing. Readmission: 03/18/2019 upon evaluation today patient presents for reevaluation concerning issues that he is having with his left lower extremity where he does have a wound noted upon inspection today. He also has a wound on the right second toe on the tip where it is apparently been rubbing in his shoe I did look at issues and you can actually see where this has been occurring as well. Fortunately there is no signs of infection with regard to the toe necessarily although it is very swollen compared to normal that does have me somewhat concerned about the possibility of further evaluation for infection/osteomyelitis. I would recommend an x-ray to start with. Otherwise he states that it is been 1-2 weeks that he has had the draining in regard to left lower extremity he does not know how this happened he has been wearing his compression appropriately and I do see that as well today but nonetheless I do think that he needs to continue to be very cautious with regard to elevation as well. Adam Merritt, Adam Merritt ([id number]) 03/25/2019 on evaluation today patient appears to be doing much better in regard to his lower extremity at this time. That is on the left. He did culture positive for Pseudomonas but the good news is he seems to be doing much better the gentamicin we applied topically seems to have done a great job for him. There is no signs of active infection at this time systemically and locally he is doing much better. No fevers, chills, nausea, vomiting, or diarrhea. In regard to his right toe this seems to be doing much better there is very little pressure noted at this point I did clean up some of the callus today around the edges of the wound as well  as the surface of the wound but to be honest he is progressing quite nicely. 10/30; the area on the left anterior lower tibia area is healed over. His edema control is adequate even though his use of the compression pumps seems very intermittent. He has a juxta lite stocking on the right leg and a think there is 1 for the left leg at home. His wife is putting these on. He has an area on the plantar right second toe which is a hammertoe they are trying to offload this 04/08/2019 on evaluation today patient appears to be doing well in regard to his lower extremities which is good news. We are seeing him today for his toe ulcer which is giving him a little bit more trouble but again still seems to be doing better at this point which is good news. He is going require some sharp debridement in regard to the toe today however. 04/15/2019 on evaluation today patient actually appears to be doing quite well with regard to his toe ulcer. I am very pleased with how things are doing in that regard. With regard to his lower extremity edema in general he tells me he is not been using the lymphedema pumps regularly although he does not use them. I think that he needs to use them more  regularly he does have a small blister on the left lower extremity this is not open at this point but obviously it something that can get worse if he does not keep the compression going and the pumps going as well. 04/22/2019 on evaluation today patient appears to be doing well with regard to his toe ulcer. He has been tolerating the dressing changes without complication. This is measuring slightly smaller this week as compared to last week. Fortunately there is no signs of active infection at this time. 05/06/2019 on evaluation today patient actually appears to be doing excellent. In fact his toe appears to be completely healed which is great news. There is no signs of active infection at this time. Readmission: Patient presents today  for follow-up after having been discharged at the beginning of December 2020. He states that in recent weeks things have reopened and has been having more trouble at this point. With that being said fortunately there is no signs of active infection at this time. No fever chills noted. Nonetheless he also does have an issue with one of his toes as well that seems to be somewhat this is on the right foot second toe. 07/25/2019 upon evaluation today patient's lower extremities appear to be doing excellent bilaterally. I really feel like he is showing signs of great improvement and in fact I think he is close to healing which is great news. There is no signs of active infection at this time. No fevers, chills, nausea, vomiting, or diarrhea. 07/31/2019 upon evaluation today patient appears to be doing excellent and in fact I think may be completely healed in regard to his right lower extremity that were still to monitor this 1 more week before closing it out. The left lower extremity is measuring smaller although not completely closed seems to be doing excellent. 08/07/2019 upon evaluation today patient appears to be doing excellent in regard to his wounds. In fact the right lower extremity is completely healed there is no issues here. The left lower extremity has 1 very small area still remaining fortunately there is no signs of infection and overall I think he is very close to closure of the here as well. 08/14/19 upon evaluation today patient appears to be doing excellent in regard to his lower extremities. In fact he appears to be completely healed as of today. Fortunately there's no signs of active infection at this time. No fevers, chills, nausea, or vomiting noted at this time. READMISSION 09/26/2019 This is a 76 year old man who is well-known to this clinic having just been discharged on 08/14/2019. He has known lymphedema and chronic venous insufficiency. He has Farrow wrap stockings and also external  compression pumps. He does not use the external compression pumps however. He does however apparently fairly faithfully use the stockings. They have not been lotion in his legs. He has developed new wounds on the right anterior as well as the left medial left lateral and left posterior calf. He returns for our review of this Past medical history includes coronary artery disease, hypertension, congestive heart failure, COPD, venous insufficiency with lymphedema, type 2 diabetes. He apparently has compression pumps, Farrow wraps and at 1 point a hospital bed although I believe he sleeps in a recliner 10/03/2019 upon evaluation today patient appears to be doing better with regard to his wounds in general. He has been tolerating the dressing changes without complication. Fortunately there is no signs of active infection. No fevers, chills, nausea, vomiting, or diarrhea. I do believe  the compression wraps are helping as they always have in the past. 10/10/2019 upon evaluation today patient appears to be doing excellent at this point. His right lower extremity is measuring much better and in fact is completely healed. His left lower extremity is also measuring better though not healed seems to be doing excellent at this point which is great news. Overall I am very pleased with how things appear currently. 10/27/2019 upon evaluation today patient appears to be doing quite well with regard to his wounds currently. He has been tolerating the dressing changes without complication. Fortunately there is no signs of active infection at this time. In fact he is almost completely healed on the left and has just a very small area open and on the right he is completely healed. 11/04/2019 upon evaluation today patient appears to be doing quite well with regard to his wounds. In fact he appears to be quite possibly completely healed on the left lower extremity now as well the right lower extremity is still doing well. With  that being said unfortunately though this may be healed I think it is a very fragile area and I cannot even confirm there is not a very small opening still remaining. I really think he may benefit from 1 additional weeks wrapping before discontinuing. 11/10/2019 upon evaluation today patient appears to be doing well in regard to his original wounds in fact everything that we were treating last week is completely healed on the left. Unfortunately he has a new skin tear just above where the wrap slipped down due to a fall he sustained causing this injury. 11/17/2019 on evaluation today patient appears to be doing better with regard to his wound. He has been tolerating the dressing changes without ODA, LAN[place]OWNE. ([id number]) complication. Fortunately there is no signs of active infection at this time. No fevers, chills, nausea, vomiting, or diarrhea. 11/24/2019 upon evaluation today patient appears to be doing well with regard to his wound. This is making good progress there does not appear to be signs of active infection but overall I do feel like he is headed in the correct direction. Overall he is tolerating the compression wrap as well. 12/02/2019 upon evaluation today patient appears to be doing well with regard to his leg ulcer. In fact this appears to be completely healed and this is the last of the wounds that he had but still the left anterior lower extremity. Fortunately there is no signs of active infection at this time. No fevers, chills, nausea, vomiting, or diarrhea. Readmission: 02/05/2020 on evaluation today patient appears to be doing well with regard to his wound all things considered. He actually tells me that a couple weeks ago he had trouble with his wraps and therefore took him off for a couple of days in order to give his legs a break. It was during this time that he actually developed increased swelling and edema of his legs and blisters on both legs. That is when he called to make the  appointment. Nonetheless since that time he began wearing his compression wraps which are Velcro in the meantime and has done extremely well with this. In fact his leg appears to be under good control as far as edema is concerned today it is nothing like what I am seeing when he has come in for evaluations in the past. They have been using some of the silver alginate at home which has been beneficial for him. Fortunately there is no signs of active  infection at this time. No fevers, chills, nausea, vomiting, or diarrhea. 02/19/2020 on evaluation today patient unfortunately does not appear to be doing quite as well as I would like to see. He has no signs of active infection at this time but unfortunately he is not doing well in regard to the overall appearance of his left leg. He has a couple other areas that are weeping now and the leg is much more swollen. I think that he needs to actually have a compression wrap to try to manage this. 02/26/2020 on evaluation today patient appears to be doing well with regard to his left lower extremity ulcer. Fortunately the swelling is down I do believe the pressure wrap seems to be doing quite well. Fortunately there is no signs of active infection at this time. 03/05/2020 upon evaluation today patient appears to be doing excellent in regard to his legs at this point. Fortunately there is no signs of active infection which is great news. Overall he feels like he is completely healed which is great news. Readmission: 05/20/2020 upon evaluation today patient appears to be doing well with regard to his leg ulcer all things considered. He is actually coming back to see [place] after having had a reopening of the ulcers on his left leg in the past several weeks. He is out of dressing supplies and his wife is been try to take care of this as best she can. 06/10/2020 upon evaluation today patient unfortunately appears to be doing significantly worse today compared to when I last  saw him. He and his wife both tell me that "there has been a lot going on". I did not actually go into detail about everything that has been happening but nonetheless he has not obviously been wearing his compression. He brings them with him today but they were not on. I am not even certain to be honest that he could get those on at this point. Nonetheless I do believe that he is going to require compression wrapping at some point shortly but right now we need to try to get what appears to be infection under control at this time. 06/28/2020 upon evaluation today patient appears to be doing well with regard to 06/28/2020 upon evaluation today patient appears to be doing well pretty well in regard to his left leg which is much better than previous. With that being said in regard to his right leg he does seem to be having some issues with a new wound after he sustained a fall. This fortunately is not too deep but does appear to be something we need to address. Neither deep which is good. 07/05/2020 on evaluation today patient appears to be doing well with regard to his wounds. In fact everything on the left leg appears to be pretty much healed on the right leg this is very close though not completely closed yet. Fortunately there is no evidence of active infection at this time. No fevers, chills, nausea, vomiting, or diarrhea. 07/13/2019 upon evaluation today patient appears to be making good progress which is great news. Overall I am extremely pleased with where things stand today. There does not appear to be any signs of active infection which is great news and overall his left leg appears healed still the right leg though not healed does seem to be making good progress which is excellent news. He is having no pain. 07/19/2020 on evaluation today patient appears to be doing well with regard to his wound. He has been  tolerating the dressing changes without complication. Fortunately there does not appear to  be any signs of active infection at this time. No fevers, chills, nausea, vomiting, or diarrhea. Readmission: 08/20/2020 on evaluation today patient presents for reevaluation here in the clinic concerning issues that he has been having with his bilateral lower extremities. Unfortunately this is an ongoing reoccurring issue. He tells me that he is really not certain exactly what caused the issue although as I discussed this further with him it appears that he has been sitting for the most part and sleeping in his chair with his feet on the ground he is not really elevating at all in fact the chair does not even have a reclining function therefore it is basically just him sitting with his feet on the ground. I think this alone has probably led to his increased swelling he is also having more difficulty walking which means that he is pumping less blood flow through his legs anyway as far as the venous stasis is concerned. All in all I think this is leading to a downward spiral of him not being able to walk very well as his legs are so heavy, they begin to weep, and overall he just does not seem to be doing as well as it was when I last saw him. 08/27/2020 upon evaluation today patient's legs actually are showing signs of improvement which is great news. There does not appear to be any evidence of infection which is also excellent news. I am extremely pleased with where things stand today. No fevers, chills, nausea, vomiting, or diarrhea. 09/03/20 upon evaluation today patient appears to be doing excellent in regard to his wounds currently. He has been tolerating the dressing changes without complication and overall I am extremely pleased with where things stand today. No fevers, chills, nausea, vomiting, or diarrhea. Overall he is healed on the right leg although the left leg not being completely healed still is doing significantly better. 09/10/2020/8/22 upon evaluation today patient appears to be doing well  with regard to his wounds. He has been tolerating the dressing changes without complication. Fortunately he appears to be completely healed. I am very pleased in that regard. As is the patient his right leg is also doing well he did get a new recliner which is helping him keep his feet off the ground which I think is helped he is down 2 cm on the left compared to last week so I think there is some definite improvement here. JADON, HARBAUGH ([id number]) Readmission 12/15/20: Mr. Jahquez Steffler is a 76 year old male with a past medical history of lymphedema, insulin-dependent type 2 diabetes, COPD and congestive heart failure that presents to the clinic for increased drainage from his legs bilaterally, Left greater than right. Patient has lymphedema. He has been seen in our clinic on multiple occasions for this issue. He currently denies signs of infection. He has not been using his lymphedema pumps. 12/23/2020 upon evaluation today patient appears to be doing well with regard to his wounds. He has been tolerating the dressing changes he really does not have any open wounds just weeping areas of the legs bilaterally and fortunately seems to be doing better at this point. 12/28/2020 upon evaluation today patient's wound is actually showing signs of good improvement. There does not appear to be any evidence of infection which is great news and overall I am extremely pleased at this point. I think is very close to complete resolution. 01/04/2021 upon evaluation today patient  actually appears to be completely healed which is great news. I do not see any signs of active infection currently which is also great news. In general I am extremely pleased with where we stand and I do believe that the patient is tolerating the dressing changes without complication. I believe he is ready to go back into his Velcro compression and I did see that they brought them with him today. Readmission: Patient presents today for  follow-up he was last seen August 2. He is having some issues with some mild drainage and leaking from the left leg. This is something that has reopened and again is a recurrent issue for him. Subsequently he does wear his Velcro compression wraps though I think we may want to look into something a little bit better for him I am thinking that we may want to try the juxta fit compression which I think would do much more effective compression treatment overall for him. 04/08/2021 upon evaluation today patient appears to be doing well with regard to his legs. I do feel like he is showing signs of improvement which is great news. No fevers, chills, nausea, vomiting, or diarrhea. 04/15/2021 upon evaluation today patient's wounds actually appear to be very close to complete resolution. He is legs are actually doing well left leg just has a small area of leaking at this point. Overall I think that we are very close to complete resolution based on what I am seeing. Nonetheless he seems to not be feeling too well today I really do not know what exactly is going on here. 04/22/2021 upon evaluation patient appears to be completely healed based on what I see today. I do not see anything open both legs appear to be doing quite well. 05/20/2021 upon evaluation today patient appears to be doing poorly in regard to his legs. He has not been a full month since we last saw him and he has reopened unfortunately. He does not appear to be any signs of active infection at this time which is great news. No fevers, chills, nausea, vomiting, or diarrhea. 12/27; the patient comes in the clinic today with severe bilateral lymphedema very dry skin but paradoxically his wounds are actually healed or should I say not open. He has compression pumps that he does not use. [Once per week] he does not have compression stockings. He comes in today with a small presumably pressure ulcer on the lateral plantar heel on the right. The  patient states he has been complaining about pain in this area for about 2 months although no wound was recognized until our intake nurse was doing her exam 06/16/2021 upon evaluation today patient appears to be doing better in regard to his wounds. He is not completely healed yet especially in regard to the right heel which is still open. Nonetheless I do think that his left leg is almost completely healed the right leg appears to be doing decently well his right heel is what still mainly open currently. 06/30/2021 upon evaluation today patient appears to be doing well with regard to his wound. Has been tolerating the dressing changes without complication. Fortunately I think his legs are doing quite well the heel is also doing better. In general I am very pleased with where we stand today. 07/14/2021 upon evaluation today patient appears to be doing well with regard to his heel ulcer. Fortunately I think that it is showing some signs of improvement unfortunately there is a little bit of skin overlying the  wound surface which is actually causing this to breakdown a little bit to be honest. It looks a little macerated. Nonetheless we need to clean this away. 07/28/2021 upon evaluation today patient appears to be doing well currently in regard to his heel ulcer which is almost completely closed and this is great news. Lets about the end of where the great news stands. In regard to his legs unfortunately both legs are weeping at this point there really are not any true open wounds. Nonetheless I do believe that working half the compression wrap him in order to get this under control. I discussed that with the patient and his wife today. 08/04/2021 upon evaluation today patient appears to be doing well currently in regard to his legs. Overall things are doing quite nicely. I do not see any evidence of active infection locally or systemically at this time which is great news and overall I am extremely pleased  with where we stand today. No fevers, chills, nausea, vomiting, or diarrhea. Electronic Signature(s) Signed: 08/04/2021 5:05:11 PM By: Worthy Keeler PA-C Entered By: Worthy Keeler on 08/04/2021 17:05:10 Adam Merritt ([id number]) -------------------------------------------------------------------------------- Physical Exam Details Patient Name: Adam Merritt Date of Service: 08/04/2021 3:30 PM Medical Record Number: [id number] Patient Account Number: [id number] Date of Birth/Sex: April 20, 1946 (75 y.o. M) Treating RN: Primary Care Provider: Jonetta Osgood Other Clinician: Referring Provider: [name] Treating Provider/Extender: Skipper Cliche in Treatment: 18 Constitutional Obese and well-hydrated in no acute distress. Respiratory normal breathing without difficulty. Psychiatric this patient is able to make decisions and demonstrates good insight into disease process. Alert and Oriented x 3. pleasant and cooperative. Notes Upon inspection patient's wounds are showing signs of improvement pretty much across the board. The heel is still open unfortunately but this is very tiny. I think were very close to getting this all well and done for certain and final. Electronic Signature(s) Signed: 08/04/2021 5:05:34 PM By: Worthy Keeler PA-C Entered By: Worthy Keeler on 08/04/2021 17:05:34 Adam Merritt ([id number]) -------------------------------------------------------------------------------- Physician Orders Details Patient Name: Adam Merritt Date of Service: 08/04/2021 3:30 PM Medical Record Number: [id number] Patient Account Number: [id number] Date of Birth/Sex: [birth year] (75 y.o. 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 o Nurse Visit as needed Bathing/ Shower/ Hygiene o May shower with wound dressing protected with water repellent cover or cast protector. o No tub bath. Anesthetic (Use 'Patient Medications' Section for Anesthetic Order Entry) o Lidocaine applied to wound bed Edema Control - Lymphedema / Segmental Compressive Device / Other o Optional: One layer of unna paste to top of compression wrap (to act as an anchor). o 4 Layer Compression System Lymphedema. - bi lat o Elevate, Exercise Daily and Avoid Standing for Long Periods of Time. o Elevate legs to the level of the heart and pump ankles as often as possible o Elevate leg(s) parallel to the floor when sitting. o Compression Pump: Use compression pump on left lower extremity for 60 minutes, twice daily. o Compression  Pump: Use compression pump on right lower extremity for 60 minutes, twice daily. Wound Treatment Wound #43 - Calcaneus Wound Laterality: Right Cleanser: Soap and Water 1 x Per Week/30 Days Discharge Instructions: Gently cleanse wound with antibacterial soap, rinse and pat dry prior to dressing wounds Topical: Triamcinolone Acetonide Cream, 0.1%, 15 (g) tube 1 x Per Week/30 Days Discharge Instructions: Apply as directed by provider. Primary Dressing: Silvercel Small 2x2 (in/in) 1 x Per Week/30 Days Discharge Instructions: Apply Silvercel Small 2x2 (in/in) as instructed Secondary Dressing: ABD Pad 5x9 (in/in) 1 x Per Week/30 Days Discharge Instructions: Cover with ABD pad Secured With: 4M Medipore H Soft Cloth Surgical Tape, 2x2 (in/yd) 1 x Per Week/30 Days Compression  Wrap: Medichoice 4 layer Compression System, 35-40 mmHG 1 x Per Week/30 Days Discharge Instructions: Apply multi-layer wrap as directed. Electronic Signature(s) Signed: 08/04/2021 5:22:07 PM By: Worthy Keeler PA-C Signed: 08/05/2021 5:12:48 PM By: Gretta Cool, BSN, RN, CWS, Kim RN, BSN Entered By: Gretta Cool, BSN, RN, CWS, Kim on 08/04/2021 16:26:20 Adam Merritt, Adam Merritt ([id number]) Adam Merritt, Adam Merritt ([id number]) -------------------------------------------------------------------------------- Problem List Details Patient Name: Adam Merritt, Adam Merritt Date of Service: 08/04/2021 3:30 PM Medical Record Number: [id number] Patient Account Number: [id number] Date of Birth/Sex: 1946/05/13 (75 y.o. 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 ulcer of other part of right lower leg with fat layer 03/25/2021 No Yes exposed L97.822 Non-pressure chronic ulcer of other part of left lower leg with fat layer 03/25/2021 No Yes exposed L89.612 Pressure ulcer of right heel, stage 2 05/31/2021 No Yes Inactive Problems Resolved Problems Electronic Signature(s) Signed: 08/04/2021 3:20:17 PM By: Worthy Keeler PA-C Entered By: Worthy Keeler on 08/04/2021 15:20:17 Adam Merritt ([id number]) -------------------------------------------------------------------------------- Progress Note Details Patient Name: Adam Merritt Date of Service: 08/04/2021 3:30 PM Medical Record Number: [id number] Patient Account Number: [id number] Date of Birth/Sex: 01-Nov-1945 (75 y.o. M) Treating RN: Primary Care Provider: Jonetta Osgood Other Clinician: Referring Provider: [name] Treating  Provider/Extender: Skipper Cliche in Treatment: 18 Subjective Chief Complaint Information obtained from Patient Bilateral LE edema and weeping ulcers History of Present Illness (HPI) 10/18/17-He is here for initial evaluation of bilateral lower extremity ulcerations in the presence of venous insufficiency and lymphedema. He has been seen by vascular medicine in the past, Dr. Lucky Cowboy, last seen in 2016. He does have a history of abnormal ABIs, which is to be expected given his lymphedema and venous insufficiency. According to Epic, it appears that all attempts for arterial evaluation and/or angiography were not follow through with by patient. He does have a history of being seen in lymphedema clinic in 2018, stopped going approximately 6 months ago stating "it didn't do any good". He does not have lymphedema pumps, he does not have custom fit compression wrap/stockings. He is diabetic and his recent A1c last month was 7.6. He admits to chronic bilateral lower extremity pain, no change in pain since blister and ulceration development. He is currently being treated with Levaquin for bronchitis. He has home health and we will continue. 10/25/17-He is here in follow-up evaluation for bilateral lower extremity ulcerationssubtle he remains on Levaquin for bronchitis. Right lower extremity with no evidence of drainage or ulceration, persistent left lower extremity ulceration. He states that home health has  not been out since his appointment. He went to [facility] on Tuesday, studies revealed: RIGHT ABI 0.9, TBI 0.6 LEFT ABI 1.1, TBI 0.6 with triphasic flow bilaterally. We will continue his same treatment plan. He has been educated on compression therapy and need for elevation. He will benefit from lymphedema pumps 11/01/17-He is here in follow-up evaluation for left lower extremity ulcer. The right lower extremity remains healed. He has home health services, but they have not been out to see  the patient for 2-3 weeks. He states it home health physical therapy changed his dressing yesterday after therapy; he placed Ace wrap compression. We are still waiting for lymphedema pumps, reordered d/t need for company change. 11/08/17-He is here in follow-up evaluation for left lower extremity ulcer. It is improved. Edema is significantly improved with compression therapy. We will continue with same treatment plan and he will follow-up next week. No word regarding lymphedema pumps 11/15/17-He is here in follow-up evaluation for left lower extremity ulcer. He is healed and will be discharged from wound care services. I have reached out to [facility] regarding his lymphedema pumps. They have been unable to reach the patient; the contact number they had with the patient's wife's cell phone and she has not answered any unrecognized calls. Contact should be made today, trial planned for next week; [facility] will continue to follow 11/27/17 on evaluation today patient has multiple blistered areas over the right lower extremity his left lower extremity appears to be doing okay. These blistered areas show signs of no infection which is great news. With that being said he did have some necrotic skin overlying which was mechanically debrided away with saline and gauze today without complication. Overall post debridement the wounds appear to be doing better but in general his swelling seems to be increased. This is obviously not good news. I think this is what has given rise to the blisters. 12/04/17 on evaluation today patient presents for follow-up concerning his bilateral lower extremity edema in the right lower extremity ulcers. He has been tolerating the dressing changes without complication. With that being said he has had no real issues with the wraps which is also good news. Overall I'm pleased with the progress he's been making. 12/11/17 on evaluation today patient appears to be doing rather  well in regard to his right lateral lower extremity ulcer. He's been tolerating the dressing changes without complication. Fortunately there does not appear to be any evidence of infection at this time. Overall I'm pleased with the progress that is being made. Unfortunately he has been in the hospital due to having what sounds to be a stomach virus/flu fortunately that is starting to get better. 12/18/17 on evaluation today patient actually appears to be doing very well in regard to his bilateral lower extremities the swelling is under fairly good control his lymphedema pumps are still not up and running quite yet. With that being said he does have several areas of opening noted as far as wounds are concerned mainly over the left lower extremity. With that being said I do believe once he gets lymphedema pumps this would least help you mention some the fluid and preventing this from occurring. Hopefully that will be set up soon sleeves are Artie in place at his home he just waiting for the machine. 12/25/17 on evaluation today patient actually appears to be doing excellent in fact all of his ulcers appear to have resolved his legs appear very well. I do  think he needs compression stockings we have discussed this and they are actually going to go to [place] today to elastic therapy to get this fitted for him. I think that is definitely a good thing to do. Readmission: 04/09/18 upon evaluation today this patient is seen for readmission due to bilateral lower extremity lymphedema. He has significant swelling of his extremities especially on the left although the right is also swollen he has weeping from both sides. There are no obvious open wounds at this point. Fortunately he has been doing fairly well for quite a bit of time since I last saw him. Nonetheless unfortunately this seems to have reopened and is giving quite a bit of trouble. He states this began about a week ago when he first called [place] to get  in to be seen. No fevers, chills, nausea, or vomiting noted at this time. He has not been using his lymphedema pumps due to the fact that they won't fit on his leg at this point likewise is also not been using his compression for essentially the same reason. 04/16/18 upon evaluation today patient actually appears to be doing a little better in regard to the fluid in his bilateral lower extremities. With that being said he's had three falls since I saw him last week. He also states that he's been feeling very poorly. I was concerned last week and feel like that the concern is still there as far as the congestion in his chest is concerned he seems to be breathing about the same as last week but again he states he's very weak he's not even able to walk further than from the chair to the door. His wife had to buy a wheelchair just to be able to get them out of the house to get to the appointment today. This has me very concerned. Adam Merritt, Adam Merritt ([id number]) 04/23/18 on evaluation today patient actually appears to be doing much better than last week's evaluation. At that time actually had to transport him to the ER via EMS and he subsequently was admitted for acute pulmonary edema, acute renal failure, and acute congestive heart failure. Fortunately he is doing much better. Apparently they did dialyze him and were able to take off roughly 35 pounds of fluid. Nonetheless he is feeling much better both in regard to his breathing and he's able to get around much better at this time compared to previous. Overall I'm very happy with how things are at this time. There does not appear to be any evidence of infection currently. No fevers, chills, nausea, or vomiting noted at this time. 04/30/2018 patient seen today for follow-up and management of bilateral lower extremity lymphedema. He did express being more sad today than usual due to the recent loss of his dog. He states that he has been compliant with using  the lymphedema pumps. However he does admit a minute over the last 2-3 days he has not been using the pumps due to the recent loss of his dog. At this time there is no drainage or open wounds to his lower extremities. The left leg edema is measuring smaller today. Still has a significant amount of edema on bilateral lower extremities With dry flaky skin. He will be referred to the lymphedema clinic for further management. Will continue 3 layer compression wraps and follow-up in 1-2 weeks.Denies any pain, fever, chairs recently. No recent falls or injuries reported during this visit. 05/07/18 on evaluation today patient actually appears to be doing very well  in regard to his lower extremities in general all things considered. With that being said he is having some pain in the legs just due to the amount of swelling. He does have an area where he had a blister on the left lateral lower extremity this is open at this point other than that there's nothing else weeping at this time. 05/14/18 on evaluation today patient actually appears to be doing excellent all things considered in regard to his lower extremities. He still has a couple areas of weeping on each leg which has continued to be the issue for him. He does have an appointment with the lymphedema clinic although this isn't until February 2020. That was the earliest they had. In the meantime he has continued to tolerate the compression wraps without complication. 05/28/18 on evaluation today patient actually appears to be doing more poorly in regard to his left lower extremity where he has a wound open at this point. He also had a fall where he subsequently injured his right great toe which has led to an open wound at the site unfortunately. He has been tolerating the dressing changes without complication in general as far as the wraps are concerned that he has not been putting any dressing on the left 1st toe ulcer site. 06/11/18 on evaluation today  patient appears to be doing much worse in regard to his bilateral lower extremity ulcers. He has been tolerating the dressing changes without complication although his legs have not been wrapped more recently. Overall I am not very pleased with the way his legs appear. I do believe he needs to be back in compression wraps he still has not received his compression wraps from the [facility] hospital as of yet. 06/18/18 on evaluation today patient actually appears to be doing significantly better than last time I saw him. He has been tolerating the compression wraps without complication in the circumferential ulcers especially appear to be doing much better. His toe ulcer on the right in regard to the great toe is better although not as good as the legs in my pinion. No fevers chills noted 07/02/18 on evaluation today patient appears to be doing much better in regard to his lower extremity ulcers. Unfortunately since I last saw him he's had the distal portion of his right great toe if he dated it sounds as if this actually went downhill very quickly. I had only seen him a few days prior and the toe did not appear to be infected at that point subsequently became infected very rapidly and it was decided by the surgeon that the distal portion of the toe needed to be removed. The patient seems to be doing well in this regard he tells me. With that being said his lower extremities are doing better from the standpoint of the wounds although he is significantly swollen at this point. 07/09/18 on evaluation today patient appears to be doing better in regard to the wounds on his lower extremities. In fact everything is almost completely healed he is just a small area on the left posterior lower extremity that is open at this point. 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 With that being said he needs some kind of compression it does sound like he may not be wearing his compression, that is the wraps, during the  entire time between when he's here visit to visit. Apparently his wife took the current one off because it began to "fall apart". 07/16/18 on evaluation today patient appears to be extremely swollen especially in regard to his left lower extremity unfortunately. He also has a new skin tear over the left lower extremity and there's a smaller area on the right lower extremity as well. Unfortunately this seems to be due in part to blistering and fluid buildup in his leg. He did get the reduction wraps that were ordered by the [facility] hospital for him to go to lymphedema clinic. With that being said his wounds on the legs have not healed to the point to where they would likely accept them as a patient lymphedema clinic currently. We need to try to get this to heal. With that being said he's been taking his wraps off which is not doing him any favors at this point. In fact this is probably quite counterproductive compared to what needs to occur. We will likely need to increase to a four layer compression wrap and continue to also utilize elevation and he has to keep the wraps on not take them off as he's been doing currently hasn't had a wrap on since Saturday. 07/23/18 on evaluation today patient appears to be doing much better in regard to his bilateral lower extremities. In fact his left lower extremity which was the largest is actually 15 cm smaller today compared to what it was last time he was here in our clinic. This is obviously good news after just one week. Nonetheless the differences he actually kept the wraps on during the entire week this time. That's not typical for him. I do believe he understands a little bit better now the severity of the situation and why it's important for him to keep these wraps on. 07/30/18 on  evaluation today patient actually appears to be doing rather well in regard to his lower extremities. His legs are much smaller than they have been in the past and he actually has only one very small rudder superficial region remaining that is not closed on the left lateral/posterior lower extremity even this is almost completely close. I do believe likely next week he will be healed without any complications. I do think we need to continue the wraps however this seems to be beneficial for him. I also think it may be a good time for [place] to go ahead and see about getting the appointment with the lymphedema clinic which is supposed to be made for him in order to keep this moving along and hopefully get them into compression wraps that will in the end help him to remain healed. 08/06/18 on evaluation today patient actually appears to be doing very well in regard as bilateral lower extremities. In fact his wounds appear to be completely healed at this time. 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 He is in the process of getting appointment with the lymphedema clinic we have made this referral were just waiting to hear back on the schedule time. We need to follow up on that today as well. 08/13/18 on evaluation today patient actually appears to be doing very well in regard to his bilateral lower extremities there are no open wounds at this point. We are gonna go ahead and see about ordering the Velcro compression wraps for him had a discussion with them about [place] doing it versus the [place] they feel like  they can definitely afford going ahead and get the wraps themselves and they would prefer to try to avoid having to go to the lymphedema clinic if it all possible which I completely understand. As long as he has good compression I'm okay either way. ENZO, TREU ([id number]) 3/17//20 on evaluation today patient actually appears to be doing well in  regard to his bilateral lower extremity ulcers. He has been tolerating the dressing changes without complication specifically the compression wraps. Overall is had no issues my fingers finding that I see at this point is that he is having trouble with constipation. He tells me he has not been able to go to the bathroom for about six days. He's taken to over-the- counter oral laxatives unfortunately this is not helping. He has not contacted his doctor. 08/27/18 on evaluation today patient appears to be doing fairly well in regard to his lower extremities at this point. There does not appear to be any new altars is swelling is very well controlled. We are still waiting for his Velcro compression wraps to arrive that should be sometime in the next week is Artie sent the check for these. 09/03/18 on evaluation today patient appears to be doing excellent in regard to his bilateral lower extremities which he shows no signs of wound openings in regard to this point. He does have his Velcro compression wraps which did arrive in the mail since I saw him last week. Overall he is doing excellent in my pinion. 09/13/18 on evaluation today patient appears to be doing very well currently in regard to the overall appearance of his bilateral lower extremities although he's a little bit more swollen than last time we saw him. At that point he been discharged without any open wounds. Nonetheless he has a small open wound on the posterior left lower extremity with some evidence of cellulitis noted as well. Fortunately I feel like he has made good progress overall with regard to his lower extremities from were things used to be. 09/20/18 on evaluation today patient actually appears to be doing much better. The erythematous lower extremity is improving wound itself which is still open appears to be doing much better as far as but appearance as well as pain is concerned overall very pleased in this regard. There's no signs of  active infection at this time. 09/27/18 on evaluation today patient's wounds on the lower extremity actually appear to be doing fairly well at this time which is good news. There is no evidence of active infection currently and again is just as left lower extremity were there any wounds at this point anyway. I believe they may be completely healed but again I'm not 100% sure based on evaluation today. I think one more week of observation would likely be a good idea. 10/04/18 on evaluation today patient actually appears to be doing excellent in regard to the left lower extremity which actually appears to be completely healed as of today. Unfortunately he's been having issues with his right lower extremity have a new wound that has opened. Fortunately there's no evidence of active infection at this time which is good news. 10/10/18 upon evaluation today patient actually appears to be doing a little bit worse with the new open area on his right posterior lower extremity. He's been tolerating the dressing changes without complication. Right now we been using his compression wraps although I think we may need to switch back to actually performing bilateral compression wraps are in the clinic.  No fevers, chills, nausea, or vomiting noted at this time. 10/17/18 on evaluation today patient actually appears to be doing quite well in regard to his bilateral lower extremity ulcers. He has been tolerating the reps without complication although he would prefer not to rewrap his legs as of today. Fortunately there's no signs of active infection which is good news. No fevers, chills, nausea, or vomiting noted at this time. 10/24/18 on evaluation today patient appears to be doing very well in regard to his lower extremities. His right lower extremity is shown signs of healing and his left lower [place] though not healed appears to be improving which is excellent news. Overall very pleased with how things seem to be  progressing at this time. The patient is likewise happy to hear this. His Velcro compression wraps however have not been put on properly will gonna show his wife how to do that properly today. 10/31/18 on evaluation today patient appears to be doing more poorly in regard to his left lower extremity in particular although both lower extremities actually are showing some signs of being worse than my previous evaluation. Unfortunately I'm just not sure that his compression stockings even with the use of the compression/lymphedema pumps seem to be controlling this well. Upon further questioning he tells me that he also is not able to lie flat in the bed due to his congestive heart failure and difficulty breathing. For that reason he sleeps in his recliner. To make matters worse his recliner also cannot even hold his legs up so instead of even being somewhat elevated they pretty much hang down to the floor. This is the way he sleeps each night which is definitely counterproductive to everything else that were attempting to do from the standpoint of controlling his fluid. Nonetheless I think that he potentially could benefit from a hospital bed although this would be something that his primary care provider would likely have to order since anything that is order on our side has to be directly related to wound care and again the hospital bed is not necessarily a direct relation to although I think it does contribute to his overall wound status on his lower extremities. 11/07/18 on evaluation today patient appears to be doing better in regard to his bilateral lower extremity. He's been tolerating the dressing changes without complication. We did in the interim since I last saw him switch to just using extras orbiting new alginate and that seems to have done much better for him. I'm very pleased with the overall progress that is made. 11/14/18 on evaluation today patient appears to be redoing rather well in regard  to his left lower extremity ulcers which are the only ones remaining at this point. Fortunately there's no signs of active infection at this time which is good news. Overall been very pleased with how things seem to be progressing currently. No fevers, chills, nausea, or vomiting noted at this time. 11/20/17 on evaluation today patient actually appears to be doing quite well in regard to his lower extremities on the left on the right he has several blisters that showed up although there's some question about whether or not he has had his broker compression wraps on like he was supposed to or not. Fortunately there's no signs of active infection at this time which is good news. Unfortunately though he is doing well from the standpoint of the left leg the right leg is not doing as well again this is when we did not  wrap last week. 11/28/18 on evaluation today patient appears to be doing well in regard to his bilateral lower extremities is left appears to be healed is right is not healed but is very close to being so. Overall very pleased with how things seem to be progressing. Patient is likewise happy that things are doing well. 12/09/18 on evaluation today patient actually appears to be completely healed which is excellent news. He actually seems to be doing well in regard to the swelling of the bilateral lower extremities which is also great news. Overall very pleased with how things seem to be progressing. Readmission: 03/18/2019 upon evaluation today patient presents for reevaluation concerning issues that he is having with his left lower extremity where he does have a wound noted upon inspection today. He also has a wound on the right second toe on the tip where it is apparently been rubbing in his shoe I did look at issues and you can actually see where this has been occurring as well. Fortunately there is no signs of infection with regard to Adam Merritt, HENSHAW. ([id number]) the toe necessarily although  it is very swollen compared to normal that does have me somewhat concerned about the possibility of further evaluation for infection/osteomyelitis. I would recommend an x-ray to start with. Otherwise he states that it is been 1-2 weeks that he has had the draining in regard to left lower extremity he does not know how this happened he has been wearing his compression appropriately and I do see that as well today but nonetheless I do think that he needs to continue to be very cautious with regard to elevation as well. 03/25/2019 on evaluation today patient appears to be doing much better in regard to his lower extremity at this time. That is on the left. He did culture positive for Pseudomonas but the good news is he seems to be doing much better the gentamicin we applied topically seems to have done a great job for him. There is no signs of active infection at this time systemically and locally he is doing much better. No fevers, chills, nausea, vomiting, or diarrhea. In regard to his right toe this seems to be doing much better there is very little pressure noted at this point I did clean up some of the callus today around the edges of the wound as well as the surface of the wound but to be honest he is progressing quite nicely. 10/30; the area on the left anterior lower tibia area is healed over. His edema control is adequate even though his use of the compression pumps seems very intermittent. He has a juxta lite stocking on the right leg and a think there is 1 for the left leg at home. His wife is putting these on. He has an area on the plantar right second toe which is a hammertoe they are trying to offload this 04/08/2019 on evaluation today patient appears to be doing well in regard to his lower extremities which is good news. We are seeing him today for his toe ulcer which is giving him a little bit more trouble but again still seems to be doing better at this point which is good news. He is  going require some sharp debridement in regard to the toe today however. 04/15/2019 on evaluation today patient actually appears to be doing quite well with regard to his toe ulcer. I am very pleased with how things are doing in that regard. With regard to his lower  extremity edema in general he tells me he is not been using the lymphedema pumps regularly although he does not use them. I think that he needs to use them more regularly he does have a small blister on the left lower extremity this is not open at this point but obviously it something that can get worse if he does not keep the compression going and the pumps going as well. 04/22/2019 on evaluation today patient appears to be doing well with regard to his toe ulcer. He has been tolerating the dressing changes without complication. This is measuring slightly smaller this week as compared to last week. Fortunately there is no signs of active infection at this time. 05/06/2019 on evaluation today patient actually appears to be doing excellent. In fact his toe appears to be completely healed which is great news. There is no signs of active infection at this time. Readmission: Patient presents today for follow-up after having been discharged at the beginning of December 2020. He states that in recent weeks things have reopened and has been having more trouble at this point. With that being said fortunately there is no signs of active infection at this time. No fever chills noted. Nonetheless he also does have an issue with one of his toes as well that seems to be somewhat this is on the right foot second toe. 07/25/2019 upon evaluation today patient's lower extremities appear to be doing excellent bilaterally. I really feel like he is showing signs of great improvement and in fact I think he is close to healing which is great news. There is no signs of active infection at this time. No fevers, chills, nausea, vomiting, or diarrhea. 07/31/2019  upon evaluation today patient appears to be doing excellent and in fact I think may be completely healed in regard to his right lower extremity that were still to monitor this 1 more week before closing it out. The left lower extremity is measuring smaller although not completely closed seems to be doing excellent. 08/07/2019 upon evaluation today patient appears to be doing excellent in regard to his wounds. In fact the right lower extremity is completely healed there is no issues here. The left lower extremity has 1 very small area still remaining fortunately there is no signs of infection and overall I think he is very close to closure of the here as well. 08/14/19 upon evaluation today patient appears to be doing excellent in regard to his lower extremities. In fact he appears to be completely healed as of today. Fortunately there's no signs of active infection at this time. No fevers, chills, nausea, or vomiting noted at this time. READMISSION 09/26/2019 This is a 76 year old man who is well-known to this clinic having just been discharged on 08/14/2019. He has known lymphedema and chronic venous insufficiency. He has Farrow wrap stockings and also external compression pumps. He does not use the external compression pumps however. He does however apparently fairly faithfully use the stockings. They have not been lotion in his legs. He has developed new wounds on the right anterior as well as the left medial left lateral and left posterior calf. He returns for our review of this Past medical history includes coronary artery disease, hypertension, congestive heart failure, COPD, venous insufficiency with lymphedema, type 2 diabetes. He apparently has compression pumps, Farrow wraps and at 1 point a hospital bed although I believe he sleeps in a recliner 10/03/2019 upon evaluation today patient appears to be doing better with regard to his  wounds in general. He has been tolerating the dressing changes  without complication. Fortunately there is no signs of active infection. No fevers, chills, nausea, vomiting, or diarrhea. I do believe the compression wraps are helping as they always have in the past. 10/10/2019 upon evaluation today patient appears to be doing excellent at this point. His right lower extremity is measuring much better and in fact is completely healed. His left lower extremity is also measuring better though not healed seems to be doing excellent at this point which is great news. Overall I am very pleased with how things appear currently. 10/27/2019 upon evaluation today patient appears to be doing quite well with regard to his wounds currently. He has been tolerating the dressing changes without complication. Fortunately there is no signs of active infection at this time. In fact he is almost completely healed on the left and has just a very small area open and on the right he is completely healed. 11/04/2019 upon evaluation today patient appears to be doing quite well with regard to his wounds. In fact he appears to be quite possibly completely healed on the left lower extremity now as well the right lower extremity is still doing well. With that being said unfortunately though this may be healed I think it is a very fragile area and I cannot even confirm there is not a very small opening still remaining. I really think he may benefit from 1 additional weeks wrapping before discontinuing. HELAMAN, MECCA ([id number]) 11/10/2019 upon evaluation today patient appears to be doing well in regard to his original wounds in fact everything that we were treating last week is completely healed on the left. Unfortunately he has a new skin tear just above where the wrap slipped down due to a fall he sustained causing this injury. 11/17/2019 on evaluation today patient appears to be doing better with regard to his wound. He has been tolerating the dressing changes without complication. Fortunately  there is no signs of active infection at this time. No fevers, chills, nausea, vomiting, or diarrhea. 11/24/2019 upon evaluation today patient appears to be doing well with regard to his wound. This is making good progress there does not appear to be signs of active infection but overall I do feel like he is headed in the correct direction. Overall he is tolerating the compression wrap as well. 12/02/2019 upon evaluation today patient appears to be doing well with regard to his leg ulcer. In fact this appears to be completely healed and this is the last of the wounds that he had but still the left anterior lower extremity. Fortunately there is no signs of active infection at this time. No fevers, chills, nausea, vomiting, or diarrhea. Readmission: 02/05/2020 on evaluation today patient appears to be doing well with regard to his wound all things considered. He actually tells me that a couple weeks ago he had trouble with his wraps and therefore took him off for a couple of days in order to give his legs a break. It was during this time that he actually developed increased swelling and edema of his legs and blisters on both legs. That is when he called to make the appointment. Nonetheless since that time he began wearing his compression wraps which are Velcro in the meantime and has done extremely well with this. In fact his leg appears to be under good control as far as edema is concerned today it is nothing like what I am seeing when he has come  in for evaluations in the past. They have been using some of the silver alginate at home which has been beneficial for him. Fortunately there is no signs of active infection at this time. No fevers, chills, nausea, vomiting, or diarrhea. 02/19/2020 on evaluation today patient unfortunately does not appear to be doing quite as well as I would like to see. He has no signs of active infection at this time but unfortunately he is not doing well in regard to the  overall appearance of his left leg. He has a couple other areas that are weeping now and the leg is much more swollen. I think that he needs to actually have a compression wrap to try to manage this. 02/26/2020 on evaluation today patient appears to be doing well with regard to his left lower extremity ulcer. Fortunately the swelling is down I do believe the pressure wrap seems to be doing quite well. Fortunately there is no signs of active infection at this time. 03/05/2020 upon evaluation today patient appears to be doing excellent in regard to his legs at this point. Fortunately there is no signs of active infection which is great news. Overall he feels like he is completely healed which is great news. Readmission: 05/20/2020 upon evaluation today patient appears to be doing well with regard to his leg ulcer all things considered. He is actually coming back to see [place] after having had a reopening of the ulcers on his left leg in the past several weeks. He is out of dressing supplies and his wife is been try to take care of this as best she can. 06/10/2020 upon evaluation today patient unfortunately appears to be doing significantly worse today compared to when I last saw him. He and his wife both tell me that "there has been a lot going on". I did not actually go into detail about everything that has been happening but nonetheless he has not obviously been wearing his compression. He brings them with him today but they were not on. I am not even certain to be honest that he could get those on at this point. Nonetheless I do believe that he is going to require compression wrapping at some point shortly but right now we need to try to get what appears to be infection under control at this time. 06/28/2020 upon evaluation today patient appears to be doing well with regard to 06/28/2020 upon evaluation today patient appears to be doing well pretty well in regard to his left leg which is much better than  previous. With that being said in regard to his right leg he does seem to be having some issues with a new wound after he sustained a fall. This fortunately is not too deep but does appear to be something we need to address. Neither deep which is good. 07/05/2020 on evaluation today patient appears to be doing well with regard to his wounds. In fact everything on the left leg appears to be pretty much healed on the right leg this is very close though not completely closed yet. Fortunately there is no evidence of active infection at this time. No fevers, chills, nausea, vomiting, or diarrhea. 07/13/2019 upon evaluation today patient appears to be making good progress which is great news. Overall I am extremely pleased with where things stand today. There does not appear to be any signs of active infection which is great news and overall his left leg appears healed still the right leg though not healed does seem to  be making good progress which is excellent news. He is having no pain. 07/19/2020 on evaluation today patient appears to be doing well with regard to his wound. He has been tolerating the dressing changes without complication. Fortunately there does not appear to be any signs of active infection at this time. No fevers, chills, nausea, vomiting, or diarrhea. Readmission: 08/20/2020 on evaluation today patient presents for reevaluation here in the clinic concerning issues that he has been having with his bilateral lower extremities. Unfortunately this is an ongoing reoccurring issue. He tells me that he is really not certain exactly what caused the issue although as I discussed this further with him it appears that he has been sitting for the most part and sleeping in his chair with his feet on the ground he is not really elevating at all in fact the chair does not even have a reclining function therefore it is basically just him sitting with his feet on the ground. I think this alone has probably  led to his increased swelling he is also having more difficulty walking which means that he is pumping less blood flow through his legs anyway as far as the venous stasis is concerned. All in all I think this is leading to a downward spiral of him not being able to walk very well as his legs are so heavy, they begin to weep, and overall he just does not seem to be doing as well as it was when I last saw him. 08/27/2020 upon evaluation today patient's legs actually are showing signs of improvement which is great news. There does not appear to be any evidence of infection which is also excellent news. I am extremely pleased with where things stand today. No fevers, chills, nausea, vomiting, or diarrhea. 09/03/20 upon evaluation today patient appears to be doing excellent in regard to his wounds currently. He has been tolerating the dressing changes without complication and overall I am extremely pleased with where things stand today. No fevers, chills, nausea, vomiting, or diarrhea. Overall he is healed on the right leg although the left leg not being completely healed still is doing significantly better. TRAVANTI, MCMANUS ([id number]) 09/10/2020/8/22 upon evaluation today patient appears to be doing well with regard to his wounds. He has been tolerating the dressing changes without complication. Fortunately he appears to be completely healed. I am very pleased in that regard. As is the patient his right leg is also doing well he did get a new recliner which is helping him keep his feet off the ground which I think is helped he is down 2 cm on the left compared to last week so I think there is some definite improvement here. Readmission 12/15/20: Mr. Lorrie Strauch is a 76 year old male with a past medical history of lymphedema, insulin-dependent type 2 diabetes, COPD and congestive heart failure that presents to the clinic for increased drainage from his legs bilaterally, Left greater than right. Patient has  lymphedema. He has been seen in our clinic on multiple occasions for this issue. He currently denies signs of infection. He has not been using his lymphedema pumps. 12/23/2020 upon evaluation today patient appears to be doing well with regard to his wounds. He has been tolerating the dressing changes he really does not have any open wounds just weeping areas of the legs bilaterally and fortunately seems to be doing better at this point. 12/28/2020 upon evaluation today patient's wound is actually showing signs of good improvement. There does not appear to  be any evidence of infection which is great news and overall I am extremely pleased at this point. I think is very close to complete resolution. 01/04/2021 upon evaluation today patient actually appears to be completely healed which is great news. I do not see any signs of active infection currently which is also great news. In general I am extremely pleased with where we stand and I do believe that the patient is tolerating the dressing changes without complication. I believe he is ready to go back into his Velcro compression and I did see that they brought them with him today. Readmission: Patient presents today for follow-up he was last seen August 2. He is having some issues with some mild drainage and leaking from the left leg. This is something that has reopened and again is a recurrent issue for him. Subsequently he does wear his Velcro compression wraps though I think we may want to look into something a little bit better for him I am thinking that we may want to try the juxta fit compression which I think would do much more effective compression treatment overall for him. 04/08/2021 upon evaluation today patient appears to be doing well with regard to his legs. I do feel like he is showing signs of improvement which is great news. No fevers, chills, nausea, vomiting, or diarrhea. 04/15/2021 upon evaluation today patient's wounds actually appear  to be very close to complete resolution. He is legs are actually doing well left leg just has a small area of leaking at this point. Overall I think that we are very close to complete resolution based on what I am seeing. Nonetheless he seems to not be feeling too well today I really do not know what exactly is going on here. 04/22/2021 upon evaluation patient appears to be completely healed based on what I see today. I do not see anything open both legs appear to be doing quite well. 05/20/2021 upon evaluation today patient appears to be doing poorly in regard to his legs. He has not been a full month since we last saw him and he has reopened unfortunately. He does not appear to be any signs of active infection at this time which is great news. No fevers, chills, nausea, vomiting, or diarrhea. 12/27; the patient comes in the clinic today with severe bilateral lymphedema very dry skin but paradoxically his wounds are actually healed or should I say not open. He has compression pumps that he does not use. [Once per week] he does not have compression stockings. He comes in today with a small presumably pressure ulcer on the lateral plantar heel on the right. The patient states he has been complaining about pain in this area for about 2 months although no wound was recognized until our intake nurse was doing her exam 06/16/2021 upon evaluation today patient appears to be doing better in regard to his wounds. He is not completely healed yet especially in regard to the right heel which is still open. Nonetheless I do think that his left leg is almost completely healed the right leg appears to be doing decently well his right heel is what still mainly open currently. 06/30/2021 upon evaluation today patient appears to be doing well with regard to his wound. Has been tolerating the dressing changes without complication. Fortunately I think his legs are doing quite well the heel is also doing better. In  general I am very pleased with where we stand today. 07/14/2021 upon evaluation today patient appears  to be doing well with regard to his heel ulcer. Fortunately I think that it is showing some signs of improvement unfortunately there is a little bit of skin overlying the wound surface which is actually causing this to breakdown a little bit to be honest. It looks a little macerated. Nonetheless we need to clean this away. 07/28/2021 upon evaluation today patient appears to be doing well currently in regard to his heel ulcer which is almost completely closed and this is great news. Lets about the end of where the great news stands. In regard to his legs unfortunately both legs are weeping at this point there really are not any true open wounds. Nonetheless I do believe that working half the compression wrap him in order to get this under control. I discussed that with the patient and his wife today. 08/04/2021 upon evaluation today patient appears to be doing well currently in regard to his legs. Overall things are doing quite nicely. I do not see any evidence of active infection locally or systemically at this time which is great news and overall I am extremely pleased with where we stand today. No fevers, chills, nausea, vomiting, or diarrhea. BRISCOE, DANIELLO ([id number]) Objective Constitutional Obese and well-hydrated in no acute distress. Vitals Time Taken: 3:26 PM, Height: 66 in, Weight: 300 lbs, BMI: 48.4, Temperature: 98.4 F, Pulse: 66 bpm, Respiratory Rate: 18 breaths/min, Blood Pressure: 159/85 mmHg, Pulse Oximetry: 98 %. General Notes: Patient states he has not taken BP medication today. Pulse ox 98% on room air. Respiratory normal breathing without difficulty. Psychiatric this patient is able to make decisions and demonstrates good insight into disease process. Alert and Oriented x 3. pleasant and cooperative. General Notes: Upon inspection patient's wounds are showing signs of  improvement pretty much across the board. The heel is still open unfortunately but this is very tiny. I think were very close to getting this all well and done for certain and final. Integumentary (Hair, Skin) Wound #43 status is Open. Original cause of wound was Gradually Appeared. The date acquired was: 05/31/2021. The wound has been in treatment 9 weeks. The wound is located on the Right Calcaneus. The wound measures 0.1cm length x 0.1cm width x 0.4cm depth; 0.008cm^2 area and 0.003cm^3 volume. There is Fat Layer (Subcutaneous Tissue) exposed. There is undermining starting at 12:00 and ending at 12:00 with a maximum distance of 0.2cm. There is a medium amount of sanguinous drainage noted. The wound margin is well defined and not attached to the wound base. There is large (67-100%) pink granulation within the wound bed. There is no necrotic tissue within the wound bed. 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 There was a Excisional Skin/Subcutaneous Tissue Debridement with a total area of 0.04 sq cm performed by Tommie Sams., PA-C. With the following instrument(s): Curette to remove Viable and Non-Viable tissue/material. Material removed includes Subcutaneous Tissue and Slough and. No specimens were taken. A time out was conducted at 04:15, prior to the start of the procedure. A Minimum amount of bleeding was controlled with Pressure. The procedure was tolerated  well. Post Debridement Measurements: 0.1cm length x 0.1cm width  x 0.4cm depth; 0.003cm^3 volume. Character of Wound/Ulcer Post Debridement is stable. Severity of Tissue Post Debridement is: Limited to breakdown of skin. Post procedure Diagnosis Wound #43: Same as Pre-Procedure Plan Follow-up Appointments: Return Appointment in 1 week. Nurse Visit as needed Bathing/ Shower/ Hygiene: May shower with wound dressing protected with water repellent cover or cast protector. No tub bath. Anesthetic (Use 'Patient Medications' Section for Anesthetic Order Entry): Lidocaine applied to wound bed FADIL, MACMASTER. ([id number]) Edema Control - Lymphedema / Segmental Compressive Device / Other: Optional: One layer of unna paste to top of compression wrap (to act as an anchor). 4 Layer Compression System Lymphedema. - bi lat Elevate, Exercise Daily and Avoid Standing for Long Periods of Time. Elevate legs to the level of the heart and pump ankles as often as possible Elevate leg(s) parallel to the floor when sitting. Compression Pump: Use compression pump on left lower extremity for 60 minutes, twice daily. Compression Pump: Use compression pump on right lower extremity for 60 minutes, twice daily. WOUND #43: - Calcaneus Wound Laterality: Right Cleanser: Soap and Water 1 x Per Week/30 Days Discharge Instructions: Gently cleanse wound with antibacterial soap, rinse and pat dry prior to dressing wounds Topical: Triamcinolone Acetonide Cream, 0.1%, 15 (g) tube 1 x Per Week/30 Days Discharge Instructions: Apply as directed by provider. Primary Dressing: Silvercel Small 2x2 (in/in) 1 x Per Week/30 Days Discharge Instructions: Apply Silvercel Small 2x2 (in/in) as instructed Secondary Dressing: ABD Pad 5x9 (in/in) 1 x Per Week/30 Days Discharge Instructions: Cover with ABD pad Secured With: 24M Medipore H Soft Cloth Surgical Tape, 2x2 (in/yd) 1 x Per Week/30 Days Compression Wrap: Medichoice 4 layer Compression System, 35-40 mmHG 1 x Per Week/30 Days Discharge  Instructions: Apply multi-layer wrap as directed. 1. Would recommend currently that we going continue with the wound care measures as before and the patient is in agreement with the plan. This includes the use of the silver alginate as well as ABD pads to cover to any open wound locations 2. We will get a continue with the 4-layer compression wrap which I do feel like is doing well bilaterally. 3. The patient will also continue with his lymphedema pumps at home that has done excellent for him. We will see patient back for reevaluation in 1 week here in the clinic. If anything worsens or changes patient will contact our office for additional recommendations. Electronic Signature(s) Signed: 08/04/2021 5:06:09 PM By: Worthy Keeler PA-C Entered By: Worthy Keeler on 08/04/2021 17:06:09 Adam Merritt ([id number]) -------------------------------------------------------------------------------- SuperBill Details Patient Name: Adam Merritt Date of Service: 08/04/2021 Medical Record Number: [id number] Patient Account Number: [id number] Date of Birth/Sex: [birth year] (75 y.o. M) Treating RN: Cornell Barman Primary Care Provider: Jonetta Osgood Other Clinician: Referring Provider: [name] Treating Provider/Extender: Skipper Cliche in Treatment: 18 Diagnosis Coding ICD-10 Codes Code Description I89.0 Lymphedema, not elsewhere classified I87.2 Venous insufficiency (chronic) (peripheral) L97.812 Non-pressure chronic ulcer of other part of right lower leg with fat layer exposed L97.822 Non-pressure chronic ulcer of other part of left lower leg with fat layer exposed L89.612 Pressure ulcer of right heel, stage 2 Facility Procedures CPT4 Code: 94709628 Description: 11042 - DEB SUBQ TISSUE 20 SQ CM/< Modifier: Quantity: 1 CPT4 Code: Description: ICD-10 Diagnosis Description L89.612 Pressure ulcer of right heel, stage 2 Modifier: Quantity: CPT4 Code: 36629476 Description: (Facility Use  Only) 29581LT - APPLY MULTLAY COMPRS LWR LT LEG Modifier: Quantity:  1 Physician Procedures CPT4 Code: 8367255 Description: 00164 - WC PHYS LEVEL 4 - EST PT Modifier: 25 Quantity: 1 CPT4 Code: Description: ICD-10 Diagnosis Description I89.0 Lymphedema, not elsewhere classified I87.2 Venous insufficiency (chronic) (peripheral) L97.812 Non-pressure chronic ulcer of other part of right lower leg with fat lay L89.612 Pressure ulcer of right heel,  stage 2 Modifier: er exposed Quantity: CPT4 Code: 2903795 Description: 58316 - WC PHYS SUBQ TISS 20 SQ CM Modifier: Quantity: 1 CPT4 Code: Description: ICD-10 Diagnosis Description L89.612 Pressure ulcer of right heel, stage 2 Modifier: Quantity: Electronic Signature(s) Signed: 08/04/2021 5:11:36 PM By: Worthy Keeler PA-C Entered By: Worthy Keeler on 08/04/2021 17:11:35

## 2021-08-09 NOTE — Procedures (Signed)
[facility] [place], [address]    Complete Pulmonary Function Testing Interpretation:  FINDINGS:  The forced vital capacity is mildly decreased FEV1 is 1.77 L which is 68% of predicted and is mildly decreased.-FVC ratio is decreased  IMPRESSION:  This pulmonary function study is consistent with mild obstructive lung disease  Allyne Gee, MD [facility] Pulmonary Critical Care Medicine Sleep Medicine

## 2021-08-10 ENCOUNTER — Encounter: Payer: Self-pay | Admitting: Internal Medicine

## 2021-08-10 LAB — PULMONARY FUNCTION TEST

## 2021-08-11 ENCOUNTER — Ambulatory Visit: Payer: Medicare PPO | Admitting: Dermatology

## 2021-08-11 ENCOUNTER — Encounter (HOSPITAL_BASED_OUTPATIENT_CLINIC_OR_DEPARTMENT_OTHER): Payer: Medicare PPO | Admitting: Internal Medicine

## 2021-08-11 ENCOUNTER — Other Ambulatory Visit: Payer: Self-pay

## 2021-08-11 DIAGNOSIS — L89612 Pressure ulcer of right heel, stage 2: Secondary | ICD-10-CM | POA: Diagnosis not present

## 2021-08-11 DIAGNOSIS — I872 Venous insufficiency (chronic) (peripheral): Secondary | ICD-10-CM

## 2021-08-11 DIAGNOSIS — I890 Lymphedema, not elsewhere classified: Secondary | ICD-10-CM

## 2021-08-11 DIAGNOSIS — L97812 Non-pressure chronic ulcer of other part of right lower leg with fat layer exposed: Secondary | ICD-10-CM | POA: Diagnosis not present

## 2021-08-11 DIAGNOSIS — E11621 Type 2 diabetes mellitus with foot ulcer: Secondary | ICD-10-CM | POA: Diagnosis not present

## 2021-08-11 DIAGNOSIS — L97822 Non-pressure chronic ulcer of other part of left lower leg with fat layer exposed: Secondary | ICD-10-CM | POA: Diagnosis not present

## 2021-08-11 NOTE — Progress Notes (Addendum)
Adam, Merritt ([id number]) Visit Report for 08/11/2021 Chief Complaint Document Details Patient Name: Adam Merritt, Adam Merritt Date of Service: 08/11/2021 11:00 AM Medical Record Number: [id number] Patient Account Number: [id number] Date of Birth/Sex: 09-05-1945 (76 y.o. M) Treating RN: Carlene Coria Primary Care Provider: Jonetta Osgood Other Clinician: Referring Provider: [name] Treating Provider/Extender: Yaakov Guthrie in Treatment: 19 Information Obtained from: Patient Chief Complaint Bilateral LE edema and weeping ulcers Electronic Signature(s) Signed: 08/11/2021 11:40:21 AM By: Kalman Shan DO Entered By: Kalman Shan on 08/11/2021 11:35:26 Adam Merritt ([id number]) -------------------------------------------------------------------------------- HPI Details Patient Name: Adam Merritt Date of Service: 08/11/2021 11:00 AM Medical Record Number: [id number] Patient Account Number: [id number] Date of Birth/Sex: [birth year] (76 y.o. M) Treating RN: Carlene Coria Primary Care Provider: Jonetta Osgood Other Clinician: Referring Provider: [name] Treating Provider/Extender: Yaakov Guthrie in Treatment: 19 History of Present Illness HPI Description: 10/18/17-He is here for initial evaluation of bilateral lower extremity ulcerations in the presence of venous insufficiency and lymphedema. He has been seen by vascular medicine in the past, Dr. Lucky Cowboy, last seen in 2016. He does have a history of abnormal ABIs, which is to be expected given his lymphedema and venous insufficiency. According to Epic, it appears that all attempts for arterial evaluation and/or angiography were not follow through with by patient. He does have a history of being seen in lymphedema clinic in 2018, stopped going approximately 6 months ago stating "it didn't do any good". He does not have lymphedema pumps, he does not have custom fit compression wrap/stockings. He is diabetic and his recent A1c  last month was 7.6. He admits to chronic bilateral lower extremity pain, no change in pain since blister and ulceration development. He is currently being treated with Levaquin for bronchitis. He has home health and we will continue. 10/25/17-He is here in follow-up evaluation for bilateral lower extremity ulcerationssubtle he remains on Levaquin for bronchitis. Right lower extremity with no evidence of drainage or ulceration, persistent left lower extremity ulceration. He states that home health has not been out since his appointment. He went to [facility] on Tuesday, studies revealed: RIGHT ABI 0.9, TBI 0.6 LEFT ABI 1.1, TBI 0.6 with triphasic flow bilaterally. We will continue his same treatment plan. He has been educated on compression therapy and need for elevation. He will benefit from lymphedema pumps 11/01/17-He is here in follow-up evaluation for left lower extremity ulcer. The right lower extremity remains healed. He has home health services, but they have not been out to see the patient for 2-3 weeks. He states it home health physical therapy changed his dressing yesterday after therapy; he placed Ace wrap compression. We are still waiting for lymphedema pumps, reordered d/t need for company change. 11/08/17-He is here in follow-up evaluation for left lower extremity ulcer. It is improved. Edema is significantly improved with compression therapy. We will continue with same treatment plan and he will follow-up next week. No word regarding lymphedema pumps 11/15/17-He is here in follow-up evaluation for left lower extremity ulcer. He is healed and will be discharged from wound care services. I have reached out to [facility] regarding his lymphedema pumps. They have been unable to reach the patient; the contact number they had with the patient's wife's cell phone and she has not answered any unrecognized calls. Contact should be made today, trial planned for next  week; [facility] will continue to follow 11/27/17 on evaluation today patient has multiple blistered areas over the right lower extremity  his left lower extremity appears to be doing okay. These blistered areas show signs of no infection which is great news. With that being said he did have some necrotic skin overlying which was mechanically debrided away with saline and gauze today without complication. Overall post debridement the wounds appear to be doing better but in general his swelling seems to be increased. This is obviously not good news. I think this is what has given rise to the blisters. 12/04/17 on evaluation today patient presents for follow-up concerning his bilateral lower extremity edema in the right lower extremity ulcers. He has been tolerating the dressing changes without complication. With that being said he has had no real issues with the wraps which is also good news. Overall I'm pleased with the progress he's been making. 12/11/17 on evaluation today patient appears to be doing rather well in regard to his right lateral lower extremity ulcer. He's been tolerating the dressing changes without complication. Fortunately there does not appear to be any evidence of infection at this time. Overall I'm pleased with the progress that is being made. Unfortunately he has been in the hospital due to having what sounds to be a stomach virus/flu fortunately that is starting to get better. 12/18/17 on evaluation today patient actually appears to be doing very well in regard to his bilateral lower extremities the swelling is under fairly good control his lymphedema pumps are still not up and running quite yet. With that being said he does have several areas of opening noted as far as wounds are concerned mainly over the left lower extremity. With that being said I do believe once he gets lymphedema pumps this would least help you mention some the fluid and preventing this from occurring.  Hopefully that will be set up soon sleeves are Artie in place at his home he just waiting for the machine. 12/25/17 on evaluation today patient actually appears to be doing excellent in fact all of his ulcers appear to have resolved his legs appear very well. I do think he needs compression stockings we have discussed this and they are actually going to go to [place] today to elastic therapy to get this fitted for him. I think that is definitely a good thing to do. Readmission: 04/09/18 upon evaluation today this patient is seen for readmission due to bilateral lower extremity lymphedema. He has significant swelling of his extremities especially on the left although the right is also swollen he has weeping from both sides. There are no obvious open wounds at this point. Fortunately he has been doing fairly well for quite a bit of time since I last saw him. Nonetheless unfortunately this seems to have reopened and is giving quite a bit of trouble. He states this began about a week ago when he first called [place] to get in to be seen. No fevers, chills, nausea, or vomiting noted at this time. He has not been using his lymphedema pumps due to the fact that they won't fit on his leg at this point likewise is also not been using his compression for essentially the same reason. 04/16/18 upon evaluation today patient actually appears to be doing a little better in regard to the fluid in his bilateral lower extremities. With that being said he's had three falls since I saw him last week. He also states that he's been feeling very poorly. I was concerned last week and feel like that the concern is still there as far as the congestion in his  chest is concerned he seems to be breathing about the same as last week but again he states he's very weak he's not even able to walk further than from the chair to the door. His wife had to buy a wheelchair just to be able to get them out of the house to get to the appointment  today. This has me very concerned. 04/23/18 on evaluation today patient actually appears to be doing much better than last week's evaluation. At that time actually had to transport him to the ER via EMS and he subsequently was admitted for acute pulmonary edema, acute renal failure, and acute congestive heart failure. Fortunately he is doing much better. Apparently they did dialyze him and were able to take off roughly 35 pounds of fluid. Nonetheless he is feeling much better both in regard to his breathing and he's able to get around much better at this time compared to previous. Overall I'm very KEIGO, WHALLEY ([id number]) happy with how things are at this time. There does not appear to be any evidence of infection currently. No fevers, chills, nausea, or vomiting noted at this time. 04/30/2018 patient seen today for follow-up and management of bilateral lower extremity lymphedema. He did express being more sad today than usual due to the recent loss of his dog. He states that he has been compliant with using the lymphedema pumps. However he does admit a minute over the last 2-3 days he has not been using the pumps due to the recent loss of his dog. At this time there is no drainage or open wounds to his lower extremities. The left leg edema is measuring smaller today. Still has a significant amount of edema on bilateral lower extremities With dry flaky skin. He will be referred to the lymphedema clinic for further management. Will continue 3 layer compression wraps and follow-up in 1-2 weeks.Denies any pain, fever, chairs recently. No recent falls or injuries reported during this visit. 05/07/18 on evaluation today patient actually appears to be doing very well in regard to his lower extremities in general all things considered. With that being said he is having some pain in the legs just due to the amount of swelling. He does have an area where he had a blister on the left lateral lower extremity  this is open at this point other than that there's nothing else weeping at this time. 05/14/18 on evaluation today patient actually appears to be doing excellent all things considered in regard to his lower extremities. He still has a couple areas of weeping on each leg which has continued to be the issue for him. He does have an appointment with the lymphedema clinic although this isn't until February 2020. That was the earliest they had. In the meantime he has continued to tolerate the compression wraps without complication. 05/28/18 on evaluation today patient actually appears to be doing more poorly in regard to his left lower extremity where he has a wound open at this point. He also had a fall where he subsequently injured his right great toe which has led to an open wound at the site unfortunately. He has been tolerating the dressing changes without complication in general as far as the wraps are concerned that he has not been putting any dressing on the left 1st toe ulcer site. 06/11/18 on evaluation today patient appears to be doing much worse in regard to his bilateral lower extremity ulcers. He has been tolerating the dressing changes without complication although his  legs have not been wrapped more recently. Overall I am not very pleased with the way his legs appear. I do believe he needs to be back in compression wraps he still has not received his compression wraps from the [facility] hospital as of yet. 06/18/18 on evaluation today patient actually appears to be doing significantly better than last time I saw him. He has been tolerating the compression wraps without complication in the circumferential ulcers especially appear to be doing much better. His toe ulcer on the right in regard to the great toe is better although not as good as the legs in my pinion. No fevers chills noted 07/02/18 on evaluation today patient appears to be doing much better in regard to his lower extremity ulcers.  Unfortunately since I last saw him he's had the distal portion of his right great toe if he dated it sounds as if this actually went downhill very quickly. I had only seen him a few days prior and the toe did not appear to be infected at that point subsequently became infected very rapidly and it was decided by the surgeon that the distal portion of the toe needed to be removed. The patient seems to be doing well in this regard he tells me. With that being said his lower extremities are doing better from the standpoint of the wounds although he is significantly swollen at this point. 07/09/18 on evaluation today patient appears to be doing better in regard to the wounds on his lower extremities. In fact everything is almost completely healed he is just a small area on the left posterior lower extremity that is open at this point. 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 With that being said he needs some kind of compression it does sound like he may not be wearing his compression, that is the wraps, during the entire time between when he's here visit to visit. Apparently his wife took the current one off because it began to "fall apart". 07/16/18 on evaluation today patient appears to be extremely swollen especially in regard to his left lower extremity unfortunately. He also has a new skin tear over the left lower extremity and there's a smaller area on the right lower extremity as well. Unfortunately this seems to be due in part to blistering and fluid buildup in his leg. He did get the reduction wraps that were ordered by the [facility] hospital for him to go to lymphedema clinic. With that being said his wounds on the legs have not healed to the point to where they would likely accept them as a patient lymphedema clinic  currently. We need to try to get this to heal. With that being said he's been taking his wraps off which is not doing him any favors at this point. In fact this is probably quite counterproductive compared to what needs to occur. We will likely need to increase to a four layer compression wrap and continue to also utilize elevation and he has to keep the wraps on not take them off as he's been doing currently hasn't had a wrap on since Saturday. 07/23/18 on evaluation today patient appears to be doing much better in regard to his bilateral lower extremities. In fact his left lower extremity which was the largest is actually 15 cm smaller today compared to what  it was last time he was here in our clinic. This is obviously good news after just one week. Nonetheless the differences he actually kept the wraps on during the entire week this time. That's not typical for him. I do believe he understands a little bit better now the severity of the situation and why it's important for him to keep these wraps on. 07/30/18 on evaluation today patient actually appears to be doing rather well in regard to his lower extremities. His legs are much smaller than they have been in the past and he actually has only one very small rudder superficial region remaining that is not closed on the left lateral/posterior lower extremity even this is almost completely close. I do believe likely next week he will be healed without any complications. I do think we need to continue the wraps however this seems to be beneficial for him. I also think it may be a good time for [place] to go ahead and see about getting the appointment with the lymphedema clinic which is supposed to be made for him in order to keep this moving along and hopefully get them into compression wraps that will in the end help him to remain healed. 08/06/18 on evaluation today patient actually appears to be doing very well in regard as bilateral lower extremities. In  fact his wounds appear to be completely healed at this time. 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 He is in the process of getting appointment with the lymphedema clinic we have made this referral were just waiting to hear back on the schedule time. We need to follow up on that today as well. 08/13/18 on evaluation today patient actually appears to be doing very well in regard to his bilateral lower extremities there are no open wounds at this point. We are gonna go ahead and see about ordering the Velcro compression wraps for him had a discussion with them about [place] doing it versus the [place] they feel like they can definitely afford going ahead and get the wraps themselves and they would prefer to try to avoid having to go to the lymphedema clinic if it all possible which I completely understand. As long as he has good compression I'm okay either way. 3/17//20 on evaluation today patient actually appears to be doing well in regard to his bilateral lower extremity ulcers. He has been tolerating the dressing changes without complication specifically the compression wraps. Overall is had no issues my fingers finding that I see at this point is that he is having trouble with constipation. He tells me he has not been able to go to the bathroom for about six days. He's taken to over-the- counter oral laxatives unfortunately this is not helping. He has not contacted his doctor. STANLY, SI ([id number]) 08/27/18 on evaluation today patient appears to be doing fairly well in regard to his lower extremities at this point. There does not appear to be any new altars is swelling is very well controlled. We are still waiting for his Velcro compression wraps to arrive that should be sometime in the next week is Artie sent the check for these. 09/03/18 on evaluation today patient appears to be doing excellent in regard to his bilateral lower extremities  which he shows no signs of wound openings in regard to this point. He does have his Velcro compression wraps which did arrive in the mail since I saw him last week. Overall he  is doing excellent in my pinion. 09/13/18 on evaluation today patient appears to be doing very well currently in regard to the overall appearance of his bilateral lower extremities although he's a little bit more swollen than last time we saw him. At that point he been discharged without any open wounds. Nonetheless he has a small open wound on the posterior left lower extremity with some evidence of cellulitis noted as well. Fortunately I feel like he has made good progress overall with regard to his lower extremities from were things used to be. 09/20/18 on evaluation today patient actually appears to be doing much better. The erythematous lower extremity is improving wound itself which is still open appears to be doing much better as far as but appearance as well as pain is concerned overall very pleased in this regard. There's no signs of active infection at this time. 09/27/18 on evaluation today patient's wounds on the lower extremity actually appear to be doing fairly well at this time which is good news. There is no evidence of active infection currently and again is just as left lower extremity were there any wounds at this point anyway. I believe they may be completely healed but again I'm not 100% sure based on evaluation today. I think one more week of observation would likely be a good idea. 10/04/18 on evaluation today patient actually appears to be doing excellent in regard to the left lower extremity which actually appears to be completely healed as of today. Unfortunately he's been having issues with his right lower extremity have a new wound that has opened. Fortunately there's no evidence of active infection at this time which is good news. 10/10/18 upon evaluation today patient actually appears to be doing a  little bit worse with the new open area on his right posterior lower extremity. He's been tolerating the dressing changes without complication. Right now we been using his compression wraps although I think we may need to switch back to actually performing bilateral compression wraps are in the clinic. No fevers, chills, nausea, or vomiting noted at this time. 10/17/18 on evaluation today patient actually appears to be doing quite well in regard to his bilateral lower extremity ulcers. He has been tolerating the reps without complication although he would prefer not to rewrap his legs as of today. Fortunately there's no signs of active infection which is good news. No fevers, chills, nausea, or vomiting noted at this time. 10/24/18 on evaluation today patient appears to be doing very well in regard to his lower extremities. His right lower extremity is shown signs of healing and his left lower [place] though not healed appears to be improving which is excellent news. Overall very pleased with how things seem to be progressing at this time. The patient is likewise happy to hear this. His Velcro compression wraps however have not been put on properly will gonna show his wife how to do that properly today. 10/31/18 on evaluation today patient appears to be doing more poorly in regard to his left lower extremity in particular although both lower extremities actually are showing some signs of being worse than my previous evaluation. Unfortunately I'm just not sure that his compression stockings even with the use of the compression/lymphedema pumps seem to be controlling this well. Upon further questioning he tells me that he also is not able to lie flat in the bed due to his congestive heart failure and difficulty breathing. For that reason he sleeps in his recliner. To  make matters worse his recliner also cannot even hold his legs up so instead of even being somewhat elevated they pretty much hang down to the  floor. This is the way he sleeps each night which is definitely counterproductive to everything else that were attempting to do from the standpoint of controlling his fluid. Nonetheless I think that he potentially could benefit from a hospital bed although this would be something that his primary care provider would likely have to order since anything that is order on our side has to be directly related to wound care and again the hospital bed is not necessarily a direct relation to although I think it does contribute to his overall wound status on his lower extremities. 11/07/18 on evaluation today patient appears to be doing better in regard to his bilateral lower extremity. He's been tolerating the dressing changes without complication. We did in the interim since I last saw him switch to just using extras orbiting new alginate and that seems to have done much better for him. I'm very pleased with the overall progress that is made. 11/14/18 on evaluation today patient appears to be redoing rather well in regard to his left lower extremity ulcers which are the only ones remaining at this point. Fortunately there's no signs of active infection at this time which is good news. Overall been very pleased with how things seem to be progressing currently. No fevers, chills, nausea, or vomiting noted at this time. 11/20/17 on evaluation today patient actually appears to be doing quite well in regard to his lower extremities on the left on the right he has several blisters that showed up although there's some question about whether or not he has had his broker compression wraps on like he was supposed to or not. Fortunately there's no signs of active infection at this time which is good news. Unfortunately though he is doing well from the standpoint of the left leg the right leg is not doing as well again this is when we did not wrap last week. 11/28/18 on evaluation today patient appears to be doing well in  regard to his bilateral lower extremities is left appears to be healed is right is not healed but is very close to being so. Overall very pleased with how things seem to be progressing. Patient is likewise happy that things are doing well. 12/09/18 on evaluation today patient actually appears to be completely healed which is excellent news. He actually seems to be doing well in regard to the swelling of the bilateral lower extremities which is also great news. Overall very pleased with how things seem to be progressing. Readmission: 03/18/2019 upon evaluation today patient presents for reevaluation concerning issues that he is having with his left lower extremity where he does have a wound noted upon inspection today. He also has a wound on the right second toe on the tip where it is apparently been rubbing in his shoe I did look at issues and you can actually see where this has been occurring as well. Fortunately there is no signs of infection with regard to the toe necessarily although it is very swollen compared to normal that does have me somewhat concerned about the possibility of further evaluation for infection/osteomyelitis. I would recommend an x-ray to start with. Otherwise he states that it is been 1-2 weeks that he has had the draining in regard to left lower extremity he does not know how this happened he has been wearing his compression appropriately  and I do see that as well today but nonetheless I do think that he needs to continue to be very cautious with regard to elevation as well. JOHNEL, YIELDING ([id number]) 03/25/2019 on evaluation today patient appears to be doing much better in regard to his lower extremity at this time. That is on the left. He did culture positive for Pseudomonas but the good news is he seems to be doing much better the gentamicin we applied topically seems to have done a great job for him. There is no signs of active infection at this time systemically and  locally he is doing much better. No fevers, chills, nausea, vomiting, or diarrhea. In regard to his right toe this seems to be doing much better there is very little pressure noted at this point I did clean up some of the callus today around the edges of the wound as well as the surface of the wound but to be honest he is progressing quite nicely. 10/30; the area on the left anterior lower tibia area is healed over. His edema control is adequate even though his use of the compression pumps seems very intermittent. He has a juxta lite stocking on the right leg and a think there is 1 for the left leg at home. His wife is putting these on. He has an area on the plantar right second toe which is a hammertoe they are trying to offload this 04/08/2019 on evaluation today patient appears to be doing well in regard to his lower extremities which is good news. We are seeing him today for his toe ulcer which is giving him a little bit more trouble but again still seems to be doing better at this point which is good news. He is going require some sharp debridement in regard to the toe today however. 04/15/2019 on evaluation today patient actually appears to be doing quite well with regard to his toe ulcer. I am very pleased with how things are doing in that regard. With regard to his lower extremity edema in general he tells me he is not been using the lymphedema pumps regularly although he does not use them. I think that he needs to use them more regularly he does have a small blister on the left lower extremity this is not open at this point but obviously it something that can get worse if he does not keep the compression going and the pumps going as well. 04/22/2019 on evaluation today patient appears to be doing well with regard to his toe ulcer. He has been tolerating the dressing changes without complication. This is measuring slightly smaller this week as compared to last week. Fortunately there is no signs  of active infection at this time. 05/06/2019 on evaluation today patient actually appears to be doing excellent. In fact his toe appears to be completely healed which is great news. There is no signs of active infection at this time. Readmission: Patient presents today for follow-up after having been discharged at the beginning of December 2020. He states that in recent weeks things have reopened and has been having more trouble at this point. With that being said fortunately there is no signs of active infection at this time. No fever chills noted. Nonetheless he also does have an issue with one of his toes as well that seems to be somewhat this is on the right foot second toe. 07/25/2019 upon evaluation today patient's lower extremities appear to be doing excellent bilaterally. I really feel like  he is showing signs of great improvement and in fact I think he is close to healing which is great news. There is no signs of active infection at this time. No fevers, chills, nausea, vomiting, or diarrhea. 07/31/2019 upon evaluation today patient appears to be doing excellent and in fact I think may be completely healed in regard to his right lower extremity that were still to monitor this 1 more week before closing it out. The left lower extremity is measuring smaller although not completely closed seems to be doing excellent. 08/07/2019 upon evaluation today patient appears to be doing excellent in regard to his wounds. In fact the right lower extremity is completely healed there is no issues here. The left lower extremity has 1 very small area still remaining fortunately there is no signs of infection and overall I think he is very close to closure of the here as well. 08/14/19 upon evaluation today patient appears to be doing excellent in regard to his lower extremities. In fact he appears to be completely healed as of today. Fortunately there's no signs of active infection at this time. No fevers,  chills, nausea, or vomiting noted at this time. READMISSION 09/26/2019 This is a 76 year old man who is well-known to this clinic having just been discharged on 08/14/2019. He has known lymphedema and chronic venous insufficiency. He has Farrow wrap stockings and also external compression pumps. He does not use the external compression pumps however. He does however apparently fairly faithfully use the stockings. They have not been lotion in his legs. He has developed new wounds on the right anterior as well as the left medial left lateral and left posterior calf. He returns for our review of this Past medical history includes coronary artery disease, hypertension, congestive heart failure, COPD, venous insufficiency with lymphedema, type 2 diabetes. He apparently has compression pumps, Farrow wraps and at 1 point a hospital bed although I believe he sleeps in a recliner 10/03/2019 upon evaluation today patient appears to be doing better with regard to his wounds in general. He has been tolerating the dressing changes without complication. Fortunately there is no signs of active infection. No fevers, chills, nausea, vomiting, or diarrhea. I do believe the compression wraps are helping as they always have in the past. 10/10/2019 upon evaluation today patient appears to be doing excellent at this point. His right lower extremity is measuring much better and in fact is completely healed. His left lower extremity is also measuring better though not healed seems to be doing excellent at this point which is great news. Overall I am very pleased with how things appear currently. 10/27/2019 upon evaluation today patient appears to be doing quite well with regard to his wounds currently. He has been tolerating the dressing changes without complication. Fortunately there is no signs of active infection at this time. In fact he is almost completely healed on the left and has just a very small area open and on the  right he is completely healed. 11/04/2019 upon evaluation today patient appears to be doing quite well with regard to his wounds. In fact he appears to be quite possibly completely healed on the left lower extremity now as well the right lower extremity is still doing well. With that being said unfortunately though this may be healed I think it is a very fragile area and I cannot even confirm there is not a very small opening still remaining. I really think he may benefit from 1 additional weeks wrapping  before discontinuing. 11/10/2019 upon evaluation today patient appears to be doing well in regard to his original wounds in fact everything that we were treating last week is completely healed on the left. Unfortunately he has a new skin tear just above where the wrap slipped down due to a fall he sustained causing this injury. 11/17/2019 on evaluation today patient appears to be doing better with regard to his wound. He has been tolerating the dressing changes without GEFFREY, MICHAELSEN. ([id number]) complication. Fortunately there is no signs of active infection at this time. No fevers, chills, nausea, vomiting, or diarrhea. 11/24/2019 upon evaluation today patient appears to be doing well with regard to his wound. This is making good progress there does not appear to be signs of active infection but overall I do feel like he is headed in the correct direction. Overall he is tolerating the compression wrap as well. 12/02/2019 upon evaluation today patient appears to be doing well with regard to his leg ulcer. In fact this appears to be completely healed and this is the last of the wounds that he had but still the left anterior lower extremity. Fortunately there is no signs of active infection at this time. No fevers, chills, nausea, vomiting, or diarrhea. Readmission: 02/05/2020 on evaluation today patient appears to be doing well with regard to his wound all things considered. He actually tells me that a  couple weeks ago he had trouble with his wraps and therefore took him off for a couple of days in order to give his legs a break. It was during this time that he actually developed increased swelling and edema of his legs and blisters on both legs. That is when he called to make the appointment. Nonetheless since that time he began wearing his compression wraps which are Velcro in the meantime and has done extremely well with this. In fact his leg appears to be under good control as far as edema is concerned today it is nothing like what I am seeing when he has come in for evaluations in the past. They have been using some of the silver alginate at home which has been beneficial for him. Fortunately there is no signs of active infection at this time. No fevers, chills, nausea, vomiting, or diarrhea. 02/19/2020 on evaluation today patient unfortunately does not appear to be doing quite as well as I would like to see. He has no signs of active infection at this time but unfortunately he is not doing well in regard to the overall appearance of his left leg. He has a couple other areas that are weeping now and the leg is much more swollen. I think that he needs to actually have a compression wrap to try to manage this. 02/26/2020 on evaluation today patient appears to be doing well with regard to his left lower extremity ulcer. Fortunately the swelling is down I do believe the pressure wrap seems to be doing quite well. Fortunately there is no signs of active infection at this time. 03/05/2020 upon evaluation today patient appears to be doing excellent in regard to his legs at this point. Fortunately there is no signs of active infection which is great news. Overall he feels like he is completely healed which is great news. Readmission: 05/20/2020 upon evaluation today patient appears to be doing well with regard to his leg ulcer all things considered. He is actually coming back to see [place] after having had a  reopening of the ulcers on his left leg  in the past several weeks. He is out of dressing supplies and his wife is been try to take care of this as best she can. 06/10/2020 upon evaluation today patient unfortunately appears to be doing significantly worse today compared to when I last saw him. He and his wife both tell me that "there has been a lot going on". I did not actually go into detail about everything that has been happening but nonetheless he has not obviously been wearing his compression. He brings them with him today but they were not on. I am not even certain to be honest that he could get those on at this point. Nonetheless I do believe that he is going to require compression wrapping at some point shortly but right now we need to try to get what appears to be infection under control at this time. 06/28/2020 upon evaluation today patient appears to be doing well with regard to 06/28/2020 upon evaluation today patient appears to be doing well pretty well in regard to his left leg which is much better than previous. With that being said in regard to his right leg he does seem to be having some issues with a new wound after he sustained a fall. This fortunately is not too deep but does appear to be something we need to address. Neither deep which is good. 07/05/2020 on evaluation today patient appears to be doing well with regard to his wounds. In fact everything on the left leg appears to be pretty much healed on the right leg this is very close though not completely closed yet. Fortunately there is no evidence of active infection at this time. No fevers, chills, nausea, vomiting, or diarrhea. 07/13/2019 upon evaluation today patient appears to be making good progress which is great news. Overall I am extremely pleased with where things stand today. There does not appear to be any signs of active infection which is great news and overall his left leg appears healed still the right leg though not  healed does seem to be making good progress which is excellent news. He is having no pain. 07/19/2020 on evaluation today patient appears to be doing well with regard to his wound. He has been tolerating the dressing changes without complication. Fortunately there does not appear to be any signs of active infection at this time. No fevers, chills, nausea, vomiting, or diarrhea. Readmission: 08/20/2020 on evaluation today patient presents for reevaluation here in the clinic concerning issues that he has been having with his bilateral lower extremities. Unfortunately this is an ongoing reoccurring issue. He tells me that he is really not certain exactly what caused the issue although as I discussed this further with him it appears that he has been sitting for the most part and sleeping in his chair with his feet on the ground he is not really elevating at all in fact the chair does not even have a reclining function therefore it is basically just him sitting with his feet on the ground. I think this alone has probably led to his increased swelling he is also having more difficulty walking which means that he is pumping less blood flow through his legs anyway as far as the venous stasis is concerned. All in all I think this is leading to a downward spiral of him not being able to walk very well as his legs are so heavy, they begin to weep, and overall he just does not seem to be doing as well as it was  when I last saw him. 08/27/2020 upon evaluation today patient's legs actually are showing signs of improvement which is great news. There does not appear to be any evidence of infection which is also excellent news. I am extremely pleased with where things stand today. No fevers, chills, nausea, vomiting, or diarrhea. 09/03/20 upon evaluation today patient appears to be doing excellent in regard to his wounds currently. He has been tolerating the dressing changes without complication and overall I am extremely  pleased with where things stand today. No fevers, chills, nausea, vomiting, or diarrhea. Overall he is healed on the right leg although the left leg not being completely healed still is doing significantly better. 09/10/2020/8/22 upon evaluation today patient appears to be doing well with regard to his wounds. He has been tolerating the dressing changes without complication. Fortunately he appears to be completely healed. I am very pleased in that regard. As is the patient his right leg is also doing well he did get a new recliner which is helping him keep his feet off the ground which I think is helped he is down 2 cm on the left compared to last week so I think there is some definite improvement here. DEJON, LUKAS ([id number]) Readmission 12/15/20: Mr. Gildardo Tickner is a 76 year old male with a past medical history of lymphedema, insulin-dependent type 2 diabetes, COPD and congestive heart failure that presents to the clinic for increased drainage from his legs bilaterally, Left greater than right. Patient has lymphedema. He has been seen in our clinic on multiple occasions for this issue. He currently denies signs of infection. He has not been using his lymphedema pumps. 12/23/2020 upon evaluation today patient appears to be doing well with regard to his wounds. He has been tolerating the dressing changes he really does not have any open wounds just weeping areas of the legs bilaterally and fortunately seems to be doing better at this point. 12/28/2020 upon evaluation today patient's wound is actually showing signs of good improvement. There does not appear to be any evidence of infection which is great news and overall I am extremely pleased at this point. I think is very close to complete resolution. 01/04/2021 upon evaluation today patient actually appears to be completely healed which is great news. I do not see any signs of active infection currently which is also great news. In general I am  extremely pleased with where we stand and I do believe that the patient is tolerating the dressing changes without complication. I believe he is ready to go back into his Velcro compression and I did see that they brought them with him today. Readmission: Patient presents today for follow-up he was last seen August 2. He is having some issues with some mild drainage and leaking from the left leg. This is something that has reopened and again is a recurrent issue for him. Subsequently he does wear his Velcro compression wraps though I think we may want to look into something a little bit better for him I am thinking that we may want to try the juxta fit compression which I think would do much more effective compression treatment overall for him. 04/08/2021 upon evaluation today patient appears to be doing well with regard to his legs. I do feel like he is showing signs of improvement which is great news. No fevers, chills, nausea, vomiting, or diarrhea. 04/15/2021 upon evaluation today patient's wounds actually appear to be very close to complete resolution. He is legs are actually doing  well left leg just has a small area of leaking at this point. Overall I think that we are very close to complete resolution based on what I am seeing. Nonetheless he seems to not be feeling too well today I really do not know what exactly is going on here. 04/22/2021 upon evaluation patient appears to be completely healed based on what I see today. I do not see anything open both legs appear to be doing quite well. 05/20/2021 upon evaluation today patient appears to be doing poorly in regard to his legs. He has not been a full month since we last saw him and he has reopened unfortunately. He does not appear to be any signs of active infection at this time which is great news. No fevers, chills, nausea, vomiting, or diarrhea. 12/27; the patient comes in the clinic today with severe bilateral lymphedema very dry skin  but paradoxically his wounds are actually healed or should I say not open. He has compression pumps that he does not use. [Once per week] he does not have compression stockings. He comes in today with a small presumably pressure ulcer on the lateral plantar heel on the right. The patient states he has been complaining about pain in this area for about 2 months although no wound was recognized until our intake nurse was doing her exam 06/16/2021 upon evaluation today patient appears to be doing better in regard to his wounds. He is not completely healed yet especially in regard to the right heel which is still open. Nonetheless I do think that his left leg is almost completely healed the right leg appears to be doing decently well his right heel is what still mainly open currently. 06/30/2021 upon evaluation today patient appears to be doing well with regard to his wound. Has been tolerating the dressing changes without complication. Fortunately I think his legs are doing quite well the heel is also doing better. In general I am very pleased with where we stand today. 07/14/2021 upon evaluation today patient appears to be doing well with regard to his heel ulcer. Fortunately I think that it is showing some signs of improvement unfortunately there is a little bit of skin overlying the wound surface which is actually causing this to breakdown a little bit to be honest. It looks a little macerated. Nonetheless we need to clean this away. 07/28/2021 upon evaluation today patient appears to be doing well currently in regard to his heel ulcer which is almost completely closed and this is great news. Lets about the end of where the great news stands. In regard to his legs unfortunately both legs are weeping at this point there really are not any true open wounds. Nonetheless I do believe that working half the compression wrap him in order to get this under control. I discussed that with the patient and his wife  today. 08/04/2021 upon evaluation today patient appears to be doing well currently in regard to his legs. Overall things are doing quite nicely. I do not see any evidence of active infection locally or systemically at this time which is great news and overall I am extremely pleased with where we stand today. No fevers, chills, nausea, vomiting, or diarrhea. 3/9; patient presents for follow-up. He has been using his lymphedema pumps twice daily with improvement in lower extremity swelling. He has no issues or complaints today. He denies signs of infection. Electronic Signature(s) Signed: 08/11/2021 11:40:21 AM By: Kalman Shan DO Entered By: Kalman Shan on 08/11/2021  11:36:04 DANE, KOPKE ([id number]) -------------------------------------------------------------------------------- Physical Exam Details Patient Name: Adam Merritt, Adam Merritt Date of Service: 08/11/2021 11:00 AM Medical Record Number: [id number] Patient Account Number: [id number] Date of Birth/Sex: June 23, 1945 (76 y.o. M) Treating RN: Carlene Coria Primary Care Provider: Jonetta Osgood Other Clinician: Referring Provider: [name] Treating Provider/Extender: Yaakov Guthrie in Treatment: 70 Constitutional . Cardiovascular . Psychiatric . Notes Small pinpoint open wound to the right heel. Good edema control to the legs bilaterally. No weeping to the legs. Electronic Signature(s) Signed: 08/11/2021 11:40:21 AM By: Kalman Shan DO Entered By: Kalman Shan on 08/11/2021 11:37:27 Adam Merritt ([id number]) -------------------------------------------------------------------------------- Physician Orders Details Patient Name: Adam Merritt Date of Service: 08/11/2021 11:00 AM Medical Record Number: [id number] Patient Account Number: [id number] Date of Birth/Sex: Oct 04, 1945 (76 y.o. M) Treating RN: Carlene Coria Primary Care Provider: Jonetta Osgood Other Clinician: Referring Provider: [name] Treating  Provider/Extender: Yaakov Guthrie in Treatment: 69 Verbal / Phone Orders: No Diagnosis Coding ICD-10 Coding Code Description I89.0 Lymphedema, not elsewhere classified I87.2 Venous insufficiency (chronic) (peripheral) L97.812 Non-pressure chronic ulcer of other part of right lower leg with fat layer exposed L97.822 Non-pressure chronic ulcer of other part of left lower leg with fat layer exposed L89.612 Pressure ulcer of right heel, stage 2 Follow-up Appointments o Return Appointment in 1 week. o Nurse Visit as needed Bathing/ Shower/ Hygiene o May shower with wound dressing protected with water repellent cover or cast protector. o No tub bath. Anesthetic (Use 'Patient Medications' Section for Anesthetic Order Entry) o Lidocaine applied to wound bed Edema Control - Lymphedema / Segmental Compressive Device / Other o Optional: One layer of unna paste to top of compression wrap (to act as an anchor). o 4 Layer Compression System Lymphedema. - bi lat o Elevate, Exercise Daily and Avoid Standing for Long Periods of Time. o Elevate legs to the level of the heart and pump ankles as often as possible o Elevate leg(s) parallel to the floor when sitting. o Compression Pump: Use compression pump on left lower extremity for 60 minutes, twice daily. o Compression Pump: Use compression pump on right lower extremity for 60 minutes, twice daily. Wound Treatment Wound #[id number] - Calcaneus Wound Laterality: Right Cleanser: Soap and Water 1 x Per Week/30 Days Discharge Instructions: Gently cleanse wound with antibacterial soap, rinse and pat dry prior to dressing wounds Topical: Triamcinolone Acetonide Cream, 0.1%, 15 (g) tube 1 x Per Week/30 Days Discharge Instructions: Apply as directed by provider. Primary Dressing: Silvercel Small 2x2 (in/in) 1 x Per Week/30 Days Discharge Instructions: Apply Silvercel Small 2x2 (in/in) as instructed Secondary Dressing: ABD Pad 5x9  (in/in) 1 x Per Week/30 Days Discharge Instructions: Cover with ABD pad Secured With: Medipore Tape - 58M Medipore H Soft Cloth Surgical Tape, 2x2 (in/yd) 1 x Per Week/30 Days Compression Wrap: Medichoice 4 layer Compression System, 35-40 mmHG 1 x Per Week/30 Days Discharge Instructions: Apply multi-layer wrap as directed. Electronic Signature(s) Signed: 08/11/2021 12:52:37 PM By: Kalman Shan DO Signed: 08/15/2021 4:21:53 PM By: Carlene Coria RN Entered By: Carlene Coria on 08/11/2021 12:49:58 PAYTEN, BEAUMIER ([id number]) JEVON, SHELLS ([id number]) -------------------------------------------------------------------------------- Problem List Details Patient Name: Adam Merritt, Adam Merritt Date of Service: 08/11/2021 11:00 AM Medical Record Number: [id number] Patient Account Number: [id number] Date of Birth/Sex: January 21, 1946 (76 y.o. M) Treating RN: Carlene Coria Primary Care Provider: Jonetta Osgood Other Clinician: Referring Provider: [name] Treating Provider/Extender: Yaakov Guthrie in Treatment: 19 Active Problems ICD-10 Encounter Code Description Active Date MDM Diagnosis I89.0  Lymphedema, not elsewhere classified 03/25/2021 No Yes I87.2 Venous insufficiency (chronic) (peripheral) 03/25/2021 No Yes L97.812 Non-pressure chronic ulcer of other part of right lower leg with fat layer 03/25/2021 No Yes exposed L97.822 Non-pressure chronic ulcer of other part of left lower leg with fat layer 03/25/2021 No Yes exposed L89.612 Pressure ulcer of right heel, stage 2 05/31/2021 No Yes Inactive Problems Resolved Problems Electronic Signature(s) Signed: 08/11/2021 11:40:21 AM By: Kalman Shan DO Entered By: Kalman Shan on 08/11/2021 11:35:21 Adam Merritt ([id number]) -------------------------------------------------------------------------------- Progress Note Details Patient Name: Adam Merritt Date of Service: 08/11/2021 11:00 AM Medical Record Number: [id number] Patient  Account Number: [id number] Date of Birth/Sex: 04/30/1946 (76 y.o. M) Treating RN: Carlene Coria Primary Care Provider: Jonetta Osgood Other Clinician: Referring Provider: [name] Treating Provider/Extender: Yaakov Guthrie in Treatment: 19 Subjective Chief Complaint Information obtained from Patient Bilateral LE edema and weeping ulcers History of Present Illness (HPI) 10/18/17-He is here for initial evaluation of bilateral lower extremity ulcerations in the presence of venous insufficiency and lymphedema. He has been seen by vascular medicine in the past, Dr. Lucky Cowboy, last seen in 2016. He does have a history of abnormal ABIs, which is to be expected given his lymphedema and venous insufficiency. According to Epic, it appears that all attempts for arterial evaluation and/or angiography were not follow through with by patient. He does have a history of being seen in lymphedema clinic in 2018, stopped going approximately 6 months ago stating "it didn't do any good". He does not have lymphedema pumps, he does not have custom fit compression wrap/stockings. He is diabetic and his recent A1c last month was 7.6. He admits to chronic bilateral lower extremity pain, no change in pain since blister and ulceration development. He is currently being treated with Levaquin for bronchitis. He has home health and we will continue. 10/25/17-He is here in follow-up evaluation for bilateral lower extremity ulcerationssubtle he remains on Levaquin for bronchitis. Right lower extremity with no evidence of drainage or ulceration, persistent left lower extremity ulceration. He states that home health has not been out since his appointment. He went to [facility] on Tuesday, studies revealed: RIGHT ABI 0.9, TBI 0.6 LEFT ABI 1.1, TBI 0.6 with triphasic flow bilaterally. We will continue his same treatment plan. He has been educated on compression therapy and need for elevation. He will benefit  from lymphedema pumps 11/01/17-He is here in follow-up evaluation for left lower extremity ulcer. The right lower extremity remains healed. He has home health services, but they have not been out to see the patient for 2-3 weeks. He states it home health physical therapy changed his dressing yesterday after therapy; he placed Ace wrap compression. We are still waiting for lymphedema pumps, reordered d/t need for company change. 11/08/17-He is here in follow-up evaluation for left lower extremity ulcer. It is improved. Edema is significantly improved with compression therapy. We will continue with same treatment plan and he will follow-up next week. No word regarding lymphedema pumps 11/15/17-He is here in follow-up evaluation for left lower extremity ulcer. He is healed and will be discharged from wound care services. I have reached out to [facility] regarding his lymphedema pumps. They have been unable to reach the patient; the contact number they had with the patient's wife's cell phone and she has not answered any unrecognized calls. Contact should be made today, trial planned for next week; [facility] will continue to follow 11/27/17 on evaluation today patient has multiple blistered  areas over the right lower extremity his left lower extremity appears to be doing okay. These blistered areas show signs of no infection which is great news. With that being said he did have some necrotic skin overlying which was mechanically debrided away with saline and gauze today without complication. Overall post debridement the wounds appear to be doing better but in general his swelling seems to be increased. This is obviously not good news. I think this is what has given rise to the blisters. 12/04/17 on evaluation today patient presents for follow-up concerning his bilateral lower extremity edema in the right lower extremity ulcers. He has been tolerating the dressing changes without complication. With  that being said he has had no real issues with the wraps which is also good news. Overall I'm pleased with the progress he's been making. 12/11/17 on evaluation today patient appears to be doing rather well in regard to his right lateral lower extremity ulcer. He's been tolerating the dressing changes without complication. Fortunately there does not appear to be any evidence of infection at this time. Overall I'm pleased with the progress that is being made. Unfortunately he has been in the hospital due to having what sounds to be a stomach virus/flu fortunately that is starting to get better. 12/18/17 on evaluation today patient actually appears to be doing very well in regard to his bilateral lower extremities the swelling is under fairly good control his lymphedema pumps are still not up and running quite yet. With that being said he does have several areas of opening noted as far as wounds are concerned mainly over the left lower extremity. With that being said I do believe once he gets lymphedema pumps this would least help you mention some the fluid and preventing this from occurring. Hopefully that will be set up soon sleeves are Artie in place at his home he just waiting for the machine. 12/25/17 on evaluation today patient actually appears to be doing excellent in fact all of his ulcers appear to have resolved his legs appear very well. I do think he needs compression stockings we have discussed this and they are actually going to go to [place] today to elastic therapy to get this fitted for him. I think that is definitely a good thing to do. Readmission: 04/09/18 upon evaluation today this patient is seen for readmission due to bilateral lower extremity lymphedema. He has significant swelling of his extremities especially on the left although the right is also swollen he has weeping from both sides. There are no obvious open wounds at this point. Fortunately he has been doing fairly well for  quite a bit of time since I last saw him. Nonetheless unfortunately this seems to have reopened and is giving quite a bit of trouble. He states this began about a week ago when he first called [place] to get in to be seen. No fevers, chills, nausea, or vomiting noted at this time. He has not been using his lymphedema pumps due to the fact that they won't fit on his leg at this point likewise is also not been using his compression for essentially the same reason. 04/16/18 upon evaluation today patient actually appears to be doing a little better in regard to the fluid in his bilateral lower extremities. With that being said he's had three falls since I saw him last week. He also states that he's been feeling very poorly. I was concerned last week and feel like that the concern is still there  as far as the congestion in his chest is concerned he seems to be breathing about the same as last week but again he states he's very weak he's not even able to walk further than from the chair to the door. His wife had to buy a wheelchair just to be able to get them out of the house to get to the appointment today. This has me very concerned. BREWER, HITCHMAN ([id number]) 04/23/18 on evaluation today patient actually appears to be doing much better than last week's evaluation. At that time actually had to transport him to the ER via EMS and he subsequently was admitted for acute pulmonary edema, acute renal failure, and acute congestive heart failure. Fortunately he is doing much better. Apparently they did dialyze him and were able to take off roughly 35 pounds of fluid. Nonetheless he is feeling much better both in regard to his breathing and he's able to get around much better at this time compared to previous. Overall I'm very happy with how things are at this time. There does not appear to be any evidence of infection currently. No fevers, chills, nausea, or vomiting noted at this time. 04/30/2018 patient seen  today for follow-up and management of bilateral lower extremity lymphedema. He did express being more sad today than usual due to the recent loss of his dog. He states that he has been compliant with using the lymphedema pumps. However he does admit a minute over the last 2-3 days he has not been using the pumps due to the recent loss of his dog. At this time there is no drainage or open wounds to his lower extremities. The left leg edema is measuring smaller today. Still has a significant amount of edema on bilateral lower extremities With dry flaky skin. He will be referred to the lymphedema clinic for further management. Will continue 3 layer compression wraps and follow-up in 1-2 weeks.Denies any pain, fever, chairs recently. No recent falls or injuries reported during this visit. 05/07/18 on evaluation today patient actually appears to be doing very well in regard to his lower extremities in general all things considered. With that being said he is having some pain in the legs just due to the amount of swelling. He does have an area where he had a blister on the left lateral lower extremity this is open at this point other than that there's nothing else weeping at this time. 05/14/18 on evaluation today patient actually appears to be doing excellent all things considered in regard to his lower extremities. He still has a couple areas of weeping on each leg which has continued to be the issue for him. He does have an appointment with the lymphedema clinic although this isn't until February 2020. That was the earliest they had. In the meantime he has continued to tolerate the compression wraps without complication. 05/28/18 on evaluation today patient actually appears to be doing more poorly in regard to his left lower extremity where he has a wound open at this point. He also had a fall where he subsequently injured his right great toe which has led to an open wound at the site unfortunately. He  has been tolerating the dressing changes without complication in general as far as the wraps are concerned that he has not been putting any dressing on the left 1st toe ulcer site. 06/11/18 on evaluation today patient appears to be doing much worse in regard to his bilateral lower extremity ulcers. He has been tolerating  the dressing changes without complication although his legs have not been wrapped more recently. Overall I am not very pleased with the way his legs appear. I do believe he needs to be back in compression wraps he still has not received his compression wraps from the [facility] hospital as of yet. 06/18/18 on evaluation today patient actually appears to be doing significantly better than last time I saw him. He has been tolerating the compression wraps without complication in the circumferential ulcers especially appear to be doing much better. His toe ulcer on the right in regard to the great toe is better although not as good as the legs in my pinion. No fevers chills noted 07/02/18 on evaluation today patient appears to be doing much better in regard to his lower extremity ulcers. Unfortunately since I last saw him he's had the distal portion of his right great toe if he dated it sounds as if this actually went downhill very quickly. I had only seen him a few days prior and the toe did not appear to be infected at that point subsequently became infected very rapidly and it was decided by the surgeon that the distal portion of the toe needed to be removed. The patient seems to be doing well in this regard he tells me. With that being said his lower extremities are doing better from the standpoint of the wounds although he is significantly swollen at this point. 07/09/18 on evaluation today patient appears to be doing better in regard to the wounds on his lower extremities. In fact everything is almost completely healed he is just a small area on the left posterior lower extremity that is open at  this point. 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 With that being said he needs some kind of compression it does sound like he may not be wearing his compression, that is the wraps, during the entire time between when he's here visit to visit. Apparently his wife took the current one off because it began to "fall apart". 07/16/18 on evaluation today patient appears to be extremely swollen especially in regard to his left lower extremity unfortunately. He also has a new skin tear over the left lower extremity and there's a smaller area on the right lower extremity as well. Unfortunately this seems to be due in part to blistering and fluid buildup in his leg. He did get the reduction wraps that were ordered by the [facility] hospital for him to go to lymphedema clinic. With that being said his wounds on the legs have not healed to the point to where they would likely accept them as a patient lymphedema clinic currently. We need to try to get this to heal. With that being said he's been taking his wraps off which is not doing him any favors at this point. In fact this is probably quite counterproductive compared to what needs to occur. We will likely need to increase to a four layer compression wrap and continue to also utilize elevation and he has to keep the wraps on not take them off as he's been doing currently hasn't had a wrap on since Saturday. 07/23/18 on evaluation today patient appears to be doing much better in regard to his bilateral lower extremities. In fact his left lower extremity which was the largest is actually 61  cm smaller today compared to what it was last time he was here in our clinic. This is obviously good news after just one week. Nonetheless the differences he actually kept the wraps on during the  entire week this time. That's not typical for him. I do believe he understands a little bit better now the severity of the situation and why it's important for him to keep these wraps on. 07/30/18 on evaluation today patient actually appears to be doing rather well in regard to his lower extremities. His legs are much smaller than they have been in the past and he actually has only one very small rudder superficial region remaining that is not closed on the left lateral/posterior lower extremity even this is almost completely close. I do believe likely next week he will be healed without any complications. I do think we need to continue the wraps however this seems to be beneficial for him. I also think it may be a good time for [place] to go ahead and see about getting the appointment with the lymphedema clinic which is supposed to be made for him in order to keep this moving along and hopefully get them into compression wraps that will in the end help him to remain healed. 08/06/18 on evaluation today patient actually appears to be doing very well in regard as bilateral lower extremities. In fact his wounds appear to be completely healed at this time. 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 He is in the process of getting appointment with the lymphedema clinic we have made this referral were just waiting to hear back on the schedule time. We need to follow up on that today as well. 08/13/18 on evaluation today patient actually appears to be doing very well in regard to his bilateral lower extremities there are no open wounds at this point. We are gonna go ahead and see about ordering the Velcro compression wraps for him had a discussion with them about [place] doing it versus the [place] they feel like they can definitely afford going ahead and get the wraps themselves and they would prefer to try to avoid having to go to the lymphedema clinic if it all  possible which I completely understand. As long as he has good compression I'm okay either way. MINGO, SIEGERT ([id number]) 3/17//20 on evaluation today patient actually appears to be doing well in regard to his bilateral lower extremity ulcers. He has been tolerating the dressing changes without complication specifically the compression wraps. Overall is had no issues my fingers finding that I see at this point is that he is having trouble with constipation. He tells me he has not been able to go to the bathroom for about six days. He's taken to over-the- counter oral laxatives unfortunately this is not helping. He has not contacted his doctor. 08/27/18 on evaluation today patient appears to be doing fairly well in regard to his lower extremities at this point. There does not appear to be any new altars is swelling is very well controlled. We are still waiting for his Velcro compression wraps to arrive that should be sometime in the next week is Artie sent the check for these. 09/03/18 on evaluation today patient appears to be doing excellent in regard to his bilateral lower extremities which he shows no signs of wound openings in regard to this point. He does have his Velcro compression wraps which did arrive in the mail since I  saw him last week. Overall he is doing excellent in my pinion. 09/13/18 on evaluation today patient appears to be doing very well currently in regard to the overall appearance of his bilateral lower extremities although he's a little bit more swollen than last time we saw him. At that point he been discharged without any open wounds. Nonetheless he has a small open wound on the posterior left lower extremity with some evidence of cellulitis noted as well. Fortunately I feel like he has made good progress overall with regard to his lower extremities from were things used to be. 09/20/18 on evaluation today patient actually appears to be doing much better. The erythematous lower  extremity is improving wound itself which is still open appears to be doing much better as far as but appearance as well as pain is concerned overall very pleased in this regard. There's no signs of active infection at this time. 09/27/18 on evaluation today patient's wounds on the lower extremity actually appear to be doing fairly well at this time which is good news. There is no evidence of active infection currently and again is just as left lower extremity were there any wounds at this point anyway. I believe they may be completely healed but again I'm not 100% sure based on evaluation today. I think one more week of observation would likely be a good idea. 10/04/18 on evaluation today patient actually appears to be doing excellent in regard to the left lower extremity which actually appears to be completely healed as of today. Unfortunately he's been having issues with his right lower extremity have a new wound that has opened. Fortunately there's no evidence of active infection at this time which is good news. 10/10/18 upon evaluation today patient actually appears to be doing a little bit worse with the new open area on his right posterior lower extremity. He's been tolerating the dressing changes without complication. Right now we been using his compression wraps although I think we may need to switch back to actually performing bilateral compression wraps are in the clinic. No fevers, chills, nausea, or vomiting noted at this time. 10/17/18 on evaluation today patient actually appears to be doing quite well in regard to his bilateral lower extremity ulcers. He has been tolerating the reps without complication although he would prefer not to rewrap his legs as of today. Fortunately there's no signs of active infection which is good news. No fevers, chills, nausea, or vomiting noted at this time. 10/24/18 on evaluation today patient appears to be doing very well in regard to his lower extremities.  His right lower extremity is shown signs of healing and his left lower Trinity though not healed appears to be improving which is excellent news. Overall very pleased with how things seem to be progressing at this time. The patient is likewise happy to hear this. His Velcro compression wraps however have not been put on properly will gonna show his wife how to do that properly today. 10/31/18 on evaluation today patient appears to be doing more poorly in regard to his left lower extremity in particular although both lower extremities actually are showing some signs of being worse than my previous evaluation. Unfortunately I'm just not sure that his compression stockings even with the use of the compression/lymphedema pumps seem to be controlling this well. Upon further questioning he tells me that he also is not able to lie flat in the bed due to his congestive heart failure and difficulty breathing. For that  reason he sleeps in his recliner. To make matters worse his recliner also cannot even hold his legs up so instead of even being somewhat elevated they pretty much hang down to the floor. This is the way he sleeps each night which is definitely counterproductive to everything else that were attempting to do from the standpoint of controlling his fluid. Nonetheless I think that he potentially could benefit from a hospital bed although this would be something that his primary care provider would likely have to order since anything that is order on our side has to be directly related to wound care and again the hospital bed is not necessarily a direct relation to although I think it does contribute to his overall wound status on his lower extremities. 11/07/18 on evaluation today patient appears to be doing better in regard to his bilateral lower extremity. He's been tolerating the dressing changes without complication. We did in the interim since I last saw him switch to just using extras orbiting new  alginate and that seems to have done much better for him. I'm very pleased with the overall progress that is made. 11/14/18 on evaluation today patient appears to be redoing rather well in regard to his left lower extremity ulcers which are the only ones remaining at this point. Fortunately there's no signs of active infection at this time which is good news. Overall been very pleased with how things seem to be progressing currently. No fevers, chills, nausea, or vomiting noted at this time. 11/20/17 on evaluation today patient actually appears to be doing quite well in regard to his lower extremities on the left on the right he has several blisters that showed up although there's some question about whether or not he has had his broker compression wraps on like he was supposed to or not. Fortunately there's no signs of active infection at this time which is good news. Unfortunately though he is doing well from the standpoint of the left leg the right leg is not doing as well again this is when we did not wrap last week. 11/28/18 on evaluation today patient appears to be doing well in regard to his bilateral lower extremities is left appears to be healed is right is not healed but is very close to being so. Overall very pleased with how things seem to be progressing. Patient is likewise happy that things are doing well. 12/09/18 on evaluation today patient actually appears to be completely healed which is excellent news. He actually seems to be doing well in regard to the swelling of the bilateral lower extremities which is also great news. Overall very pleased with how things seem to be progressing. Readmission: 03/18/2019 upon evaluation today patient presents for reevaluation concerning issues that he is having with his left lower extremity where he does have a wound noted upon inspection today. He also has a wound on the right second toe on the tip where it is apparently been rubbing in his shoe I  did look at issues and you can actually see where this has been occurring as well. Fortunately there is no signs of infection with regard to Adam Merritt, Adam Merritt. ([id number]) the toe necessarily although it is very swollen compared to normal that does have me somewhat concerned about the possibility of further evaluation for infection/osteomyelitis. I would recommend an x-ray to start with. Otherwise he states that it is been 1-2 weeks that he has had the draining in regard to left lower extremity he does not  know how this happened he has been wearing his compression appropriately and I do see that as well today but nonetheless I do think that he needs to continue to be very cautious with regard to elevation as well. 03/25/2019 on evaluation today patient appears to be doing much better in regard to his lower extremity at this time. That is on the left. He did culture positive for Pseudomonas but the good news is he seems to be doing much better the gentamicin we applied topically seems to have done a great job for him. There is no signs of active infection at this time systemically and locally he is doing much better. No fevers, chills, nausea, vomiting, or diarrhea. In regard to his right toe this seems to be doing much better there is very little pressure noted at this point I did clean up some of the callus today around the edges of the wound as well as the surface of the wound but to be honest he is progressing quite nicely. 10/30; the area on the left anterior lower tibia area is healed over. His edema control is adequate even though his use of the compression pumps seems very intermittent. He has a juxta lite stocking on the right leg and a think there is 1 for the left leg at home. His wife is putting these on. He has an area on the plantar right second toe which is a hammertoe they are trying to offload this 04/08/2019 on evaluation today patient appears to be doing well in regard to his lower  extremities which is good news. We are seeing him today for his toe ulcer which is giving him a little bit more trouble but again still seems to be doing better at this point which is good news. He is going require some sharp debridement in regard to the toe today however. 04/15/2019 on evaluation today patient actually appears to be doing quite well with regard to his toe ulcer. I am very pleased with how things are doing in that regard. With regard to his lower extremity edema in general he tells me he is not been using the lymphedema pumps regularly although he does not use them. I think that he needs to use them more regularly he does have a small blister on the left lower extremity this is not open at this point but obviously it something that can get worse if he does not keep the compression going and the pumps going as well. 04/22/2019 on evaluation today patient appears to be doing well with regard to his toe ulcer. He has been tolerating the dressing changes without complication. This is measuring slightly smaller this week as compared to last week. Fortunately there is no signs of active infection at this time. 05/06/2019 on evaluation today patient actually appears to be doing excellent. In fact his toe appears to be completely healed which is great news. There is no signs of active infection at this time. Readmission: Patient presents today for follow-up after having been discharged at the beginning of December 2020. He states that in recent weeks things have reopened and has been having more trouble at this point. With that being said fortunately there is no signs of active infection at this time. No fever chills noted. Nonetheless he also does have an issue with one of his toes as well that seems to be somewhat this is on the right foot second toe. 07/25/2019 upon evaluation today patient's lower extremities appear to be doing  excellent bilaterally. I really feel like he is showing signs  of great improvement and in fact I think he is close to healing which is great news. There is no signs of active infection at this time. No fevers, chills, nausea, vomiting, or diarrhea. 07/31/2019 upon evaluation today patient appears to be doing excellent and in fact I think may be completely healed in regard to his right lower extremity that were still to monitor this 1 more week before closing it out. The left lower extremity is measuring smaller although not completely closed seems to be doing excellent. 08/07/2019 upon evaluation today patient appears to be doing excellent in regard to his wounds. In fact the right lower extremity is completely healed there is no issues here. The left lower extremity has 1 very small area still remaining fortunately there is no signs of infection and overall I think he is very close to closure of the here as well. 08/14/19 upon evaluation today patient appears to be doing excellent in regard to his lower extremities. In fact he appears to be completely healed as of today. Fortunately there's no signs of active infection at this time. No fevers, chills, nausea, or vomiting noted at this time. READMISSION 09/26/2019 This is a 76 year old man who is well-known to this clinic having just been discharged on 08/14/2019. He has known lymphedema and chronic venous insufficiency. He has Farrow wrap stockings and also external compression pumps. He does not use the external compression pumps however. He does however apparently fairly faithfully use the stockings. They have not been lotion in his legs. He has developed new wounds on the right anterior as well as the left medial left lateral and left posterior calf. He returns for our review of this Past medical history includes coronary artery disease, hypertension, congestive heart failure, COPD, venous insufficiency with lymphedema, type 2 diabetes. He apparently has compression pumps, Farrow wraps and at 1 point a hospital  bed although I believe he sleeps in a recliner 10/03/2019 upon evaluation today patient appears to be doing better with regard to his wounds in general. He has been tolerating the dressing changes without complication. Fortunately there is no signs of active infection. No fevers, chills, nausea, vomiting, or diarrhea. I do believe the compression wraps are helping as they always have in the past. 10/10/2019 upon evaluation today patient appears to be doing excellent at this point. His right lower extremity is measuring much better and in fact is completely healed. His left lower extremity is also measuring better though not healed seems to be doing excellent at this point which is great news. Overall I am very pleased with how things appear currently. 10/27/2019 upon evaluation today patient appears to be doing quite well with regard to his wounds currently. He has been tolerating the dressing changes without complication. Fortunately there is no signs of active infection at this time. In fact he is almost completely healed on the left and has just a very small area open and on the right he is completely healed. 11/04/2019 upon evaluation today patient appears to be doing quite well with regard to his wounds. In fact he appears to be quite possibly completely healed on the left lower extremity now as well the right lower extremity is still doing well. With that being said unfortunately though this may be healed I think it is a very fragile area and I cannot even confirm there is not a very small opening still remaining. I really think he may  benefit from 1 additional weeks wrapping before discontinuing. CADELL, GABRIELSON ([id number]) 11/10/2019 upon evaluation today patient appears to be doing well in regard to his original wounds in fact everything that we were treating last week is completely healed on the left. Unfortunately he has a new skin tear just above where the wrap slipped down due to a fall he  sustained causing this injury. 11/17/2019 on evaluation today patient appears to be doing better with regard to his wound. He has been tolerating the dressing changes without complication. Fortunately there is no signs of active infection at this time. No fevers, chills, nausea, vomiting, or diarrhea. 11/24/2019 upon evaluation today patient appears to be doing well with regard to his wound. This is making good progress there does not appear to be signs of active infection but overall I do feel like he is headed in the correct direction. Overall he is tolerating the compression wrap as well. 12/02/2019 upon evaluation today patient appears to be doing well with regard to his leg ulcer. In fact this appears to be completely healed and this is the last of the wounds that he had but still the left anterior lower extremity. Fortunately there is no signs of active infection at this time. No fevers, chills, nausea, vomiting, or diarrhea. Readmission: 02/05/2020 on evaluation today patient appears to be doing well with regard to his wound all things considered. He actually tells me that a couple weeks ago he had trouble with his wraps and therefore took him off for a couple of days in order to give his legs a break. It was during this time that he actually developed increased swelling and edema of his legs and blisters on both legs. That is when he called to make the appointment. Nonetheless since that time he began wearing his compression wraps which are Velcro in the meantime and has done extremely well with this. In fact his leg appears to be under good control as far as edema is concerned today it is nothing like what I am seeing when he has come in for evaluations in the past. They have been using some of the silver alginate at home which has been beneficial for him. Fortunately there is no signs of active infection at this time. No fevers, chills, nausea, vomiting, or diarrhea. 02/19/2020 on evaluation  today patient unfortunately does not appear to be doing quite as well as I would like to see. He has no signs of active infection at this time but unfortunately he is not doing well in regard to the overall appearance of his left leg. He has a couple other areas that are weeping now and the leg is much more swollen. I think that he needs to actually have a compression wrap to try to manage this. 02/26/2020 on evaluation today patient appears to be doing well with regard to his left lower extremity ulcer. Fortunately the swelling is down I do believe the pressure wrap seems to be doing quite well. Fortunately there is no signs of active infection at this time. 03/05/2020 upon evaluation today patient appears to be doing excellent in regard to his legs at this point. Fortunately there is no signs of active infection which is great news. Overall he feels like he is completely healed which is great news. Readmission: 05/20/2020 upon evaluation today patient appears to be doing well with regard to his leg ulcer all things considered. He is actually coming back to see [place] after having had a reopening of  the ulcers on his left leg in the past several weeks. He is out of dressing supplies and his wife is been try to take care of this as best she can. 06/10/2020 upon evaluation today patient unfortunately appears to be doing significantly worse today compared to when I last saw him. He and his wife both tell me that "there has been a lot going on". I did not actually go into detail about everything that has been happening but nonetheless he has not obviously been wearing his compression. He brings them with him today but they were not on. I am not even certain to be honest that he could get those on at this point. Nonetheless I do believe that he is going to require compression wrapping at some point shortly but right now we need to try to get what appears to be infection under control at this time. 06/28/2020 upon  evaluation today patient appears to be doing well with regard to 06/28/2020 upon evaluation today patient appears to be doing well pretty well in regard to his left leg which is much better than previous. With that being said in regard to his right leg he does seem to be having some issues with a new wound after he sustained a fall. This fortunately is not too deep but does appear to be something we need to address. Neither deep which is good. 07/05/2020 on evaluation today patient appears to be doing well with regard to his wounds. In fact everything on the left leg appears to be pretty much healed on the right leg this is very close though not completely closed yet. Fortunately there is no evidence of active infection at this time. No fevers, chills, nausea, vomiting, or diarrhea. 07/13/2019 upon evaluation today patient appears to be making good progress which is great news. Overall I am extremely pleased with where things stand today. There does not appear to be any signs of active infection which is great news and overall his left leg appears healed still the right leg though not healed does seem to be making good progress which is excellent news. He is having no pain. 07/19/2020 on evaluation today patient appears to be doing well with regard to his wound. He has been tolerating the dressing changes without complication. Fortunately there does not appear to be any signs of active infection at this time. No fevers, chills, nausea, vomiting, or diarrhea. Readmission: 08/20/2020 on evaluation today patient presents for reevaluation here in the clinic concerning issues that he has been having with his bilateral lower extremities. Unfortunately this is an ongoing reoccurring issue. He tells me that he is really not certain exactly what caused the issue although as I discussed this further with him it appears that he has been sitting for the most part and sleeping in his chair with his feet on the ground  he is not really elevating at all in fact the chair does not even have a reclining function therefore it is basically just him sitting with his feet on the ground. I think this alone has probably led to his increased swelling he is also having more difficulty walking which means that he is pumping less blood flow through his legs anyway as far as the venous stasis is concerned. All in all I think this is leading to a downward spiral of him not being able to walk very well as his legs are so heavy, they begin to weep, and overall he just does not seem to  be doing as well as it was when I last saw him. 08/27/2020 upon evaluation today patient's legs actually are showing signs of improvement which is great news. There does not appear to be any evidence of infection which is also excellent news. I am extremely pleased with where things stand today. No fevers, chills, nausea, vomiting, or diarrhea. 09/03/20 upon evaluation today patient appears to be doing excellent in regard to his wounds currently. He has been tolerating the dressing changes without complication and overall I am extremely pleased with where things stand today. No fevers, chills, nausea, vomiting, or diarrhea. Overall he is healed on the right leg although the left leg not being completely healed still is doing significantly better. JAMES, SENN ([id number]) 09/10/2020/8/22 upon evaluation today patient appears to be doing well with regard to his wounds. He has been tolerating the dressing changes without complication. Fortunately he appears to be completely healed. I am very pleased in that regard. As is the patient his right leg is also doing well he did get a new recliner which is helping him keep his feet off the ground which I think is helped he is down 2 cm on the left compared to last week so I think there is some definite improvement here. Readmission 12/15/20: Mr. Adrian Dinovo is a 76 year old male with a past medical history of  lymphedema, insulin-dependent type 2 diabetes, COPD and congestive heart failure that presents to the clinic for increased drainage from his legs bilaterally, Left greater than right. Patient has lymphedema. He has been seen in our clinic on multiple occasions for this issue. He currently denies signs of infection. He has not been using his lymphedema pumps. 12/23/2020 upon evaluation today patient appears to be doing well with regard to his wounds. He has been tolerating the dressing changes he really does not have any open wounds just weeping areas of the legs bilaterally and fortunately seems to be doing better at this point. 12/28/2020 upon evaluation today patient's wound is actually showing signs of good improvement. There does not appear to be any evidence of infection which is great news and overall I am extremely pleased at this point. I think is very close to complete resolution. 01/04/2021 upon evaluation today patient actually appears to be completely healed which is great news. I do not see any signs of active infection currently which is also great news. In general I am extremely pleased with where we stand and I do believe that the patient is tolerating the dressing changes without complication. I believe he is ready to go back into his Velcro compression and I did see that they brought them with him today. Readmission: Patient presents today for follow-up he was last seen August 2. He is having some issues with some mild drainage and leaking from the left leg. This is something that has reopened and again is a recurrent issue for him. Subsequently he does wear his Velcro compression wraps though I think we may want to look into something a little bit better for him I am thinking that we may want to try the juxta fit compression which I think would do much more effective compression treatment overall for him. 04/08/2021 upon evaluation today patient appears to be doing well with regard to  his legs. I do feel like he is showing signs of improvement which is great news. No fevers, chills, nausea, vomiting, or diarrhea. 04/15/2021 upon evaluation today patient's wounds actually appear to be very close to complete  resolution. He is legs are actually doing well left leg just has a small area of leaking at this point. Overall I think that we are very close to complete resolution based on what I am seeing. Nonetheless he seems to not be feeling too well today I really do not know what exactly is going on here. 04/22/2021 upon evaluation patient appears to be completely healed based on what I see today. I do not see anything open both legs appear to be doing quite well. 05/20/2021 upon evaluation today patient appears to be doing poorly in regard to his legs. He has not been a full month since we last saw him and he has reopened unfortunately. He does not appear to be any signs of active infection at this time which is great news. No fevers, chills, nausea, vomiting, or diarrhea. 12/27; the patient comes in the clinic today with severe bilateral lymphedema very dry skin but paradoxically his wounds are actually healed or should I say not open. He has compression pumps that he does not use. [Once per week] he does not have compression stockings. He comes in today with a small presumably pressure ulcer on the lateral plantar heel on the right. The patient states he has been complaining about pain in this area for about 2 months although no wound was recognized until our intake nurse was doing her exam 06/16/2021 upon evaluation today patient appears to be doing better in regard to his wounds. He is not completely healed yet especially in regard to the right heel which is still open. Nonetheless I do think that his left leg is almost completely healed the right leg appears to be doing decently well his right heel is what still mainly open currently. 06/30/2021 upon evaluation today patient  appears to be doing well with regard to his wound. Has been tolerating the dressing changes without complication. Fortunately I think his legs are doing quite well the heel is also doing better. In general I am very pleased with where we stand today. 07/14/2021 upon evaluation today patient appears to be doing well with regard to his heel ulcer. Fortunately I think that it is showing some signs of improvement unfortunately there is a little bit of skin overlying the wound surface which is actually causing this to breakdown a little bit to be honest. It looks a little macerated. Nonetheless we need to clean this away. 07/28/2021 upon evaluation today patient appears to be doing well currently in regard to his heel ulcer which is almost completely closed and this is great news. Lets about the end of where the great news stands. In regard to his legs unfortunately both legs are weeping at this point there really are not any true open wounds. Nonetheless I do believe that working half the compression wrap him in order to get this under control. I discussed that with the patient and his wife today. 08/04/2021 upon evaluation today patient appears to be doing well currently in regard to his legs. Overall things are doing quite nicely. I do not see any evidence of active infection locally or systemically at this time which is great news and overall I am extremely pleased with where we stand today. No fevers, chills, nausea, vomiting, or diarrhea. 3/9; patient presents for follow-up. He has been using his lymphedema pumps twice daily with improvement in lower extremity swelling. He has no issues or complaints today. He denies signs of infection. Adam Merritt, Adam Merritt ([id number]) Objective Constitutional Vitals Time Taken: 11:17  AM, Height: 66 in, Weight: 300 lbs, BMI: 48.4, Temperature: 98.2 F, Pulse: 67 bpm, Respiratory Rate: 18 breaths/min, Blood Pressure: 144/77 mmHg. General Notes: Small pinpoint open wound  to the right heel. Good edema control to the legs bilaterally. No weeping to the legs. Integumentary (Hair, Skin) Wound #43 status is Open. Original cause of wound was Gradually Appeared. The date acquired was: 05/31/2021. The wound has been in treatment 10 weeks. The wound is located on the Right Calcaneus. The wound measures 0.1cm length x 0.1cm width x 0.1cm depth; 0.008cm^2 area and 0.001cm^3 volume. There is Fat Layer (Subcutaneous Tissue) exposed. There is no tunneling or undermining noted. There is a medium amount of sanguinous drainage noted. The wound margin is well defined and not attached to the wound base. There is large (67-100%) pink granulation within the wound bed. There is no necrotic tissue within the wound bed. Assessment Active Problems ICD-10 Lymphedema, not elsewhere classified Venous insufficiency (chronic) (peripheral) Non-pressure chronic ulcer of other part of right lower leg with fat layer exposed Non-pressure chronic ulcer of other part of left lower leg with fat layer exposed Pressure ulcer of right heel, stage 2 Patient has done well with silver alginate and compression therapy And we will continue with this therapy as he has a small open wound still present to the right heel. He has been using his lymphedema pumps which has helped greatly. I recommended continuing this. Follow-up in 1 week. Plan 1. Continue lymphedema pumps 2. Silver alginate under 3 layer compression 3. Follow-up in 1 week Electronic Signature(s) Signed: 08/11/2021 11:40:21 AM By: Kalman Shan DO Entered By: Kalman Shan on 08/11/2021 11:39:29 Adam Merritt ([id number]) -------------------------------------------------------------------------------- SuperBill Details Patient Name: Adam Merritt Date of Service: 08/11/2021 Medical Record Number: [id number] Patient Account Number: [id number] Date of Birth/Sex: 05-Apr-1946 (76 y.o. 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 Physician Procedures CPT4 Code: 0626948 Description: 54627 - WC PHYS LEVEL 3 - EST PT Modifier: Quantity: 1 CPT4 Code: Description: ICD-10 Diagnosis Description L89.612 Pressure ulcer of right heel, stage 2 I89.0 Lymphedema, not elsewhere classified I87.2 Venous insufficiency (chronic) (peripheral) Modifier: Quantity: Electronic Signature(s) Signed: 08/12/2021 11:44:35 AM By: Carlene Coria RN Signed: 08/15/2021 9:05:59 AM By: Kalman Shan DO Previous Signature: 08/11/2021 1:43:45 PM Version By: Kalman Shan DO Previous Signature: 08/11/2021 11:40:21 AM Version By: Kalman Shan DO Entered By: Carlene Coria on 08/12/2021 11:44:34

## 2021-08-15 NOTE — Progress Notes (Signed)
MARKEZ, DOWLAND ([id number]) Visit Report for 08/11/2021 Arrival Information Details Patient Name: Adam, Merritt Date of Service: 08/11/2021 11:00 AM Medical Record Number: [id number] Patient Account Number: [id number] Date of Birth/Sex: [birth year] (75 y.o. M) Treating RN: Carlene Coria Primary Care Tianni Escamilla: Jonetta Osgood Other Clinician: Referring Maybel Dambrosio: [name] Treating Alexandrya Chim/Extender: Yaakov Guthrie in Treatment: 60 Visit Information History Since Last Visit All ordered tests and consults were completed: No Patient Arrived: Wheel Chair Added or deleted any medications: No Arrival Time: 11:15 Any new allergies or adverse reactions: No Accompanied By: wife Had a fall or experienced change in No Transfer Assistance: None activities of daily living that may affect Patient Identification Verified: Yes risk of falls: Secondary Verification Process Completed: Yes Signs or symptoms of abuse/neglect since last visito No Patient Requires Transmission-Based Precautions: No Hospitalized since last visit: No Patient Has Alerts: Yes Implantable device outside of the clinic excluding No Patient Alerts: diabetic type 2 cellular tissue based products placed in the center since last visit: Has Dressing in Place as Prescribed: Yes Pain Present Now: No Electronic Signature(s) Signed: 08/15/2021 4:21:53 PM By: Carlene Coria RN Entered By: Carlene Coria on 08/11/2021 11:17:01 Adam Merritt ([id number]) -------------------------------------------------------------------------------- Clinic Level of Care Assessment Details Patient Name: Adam Merritt Date of Service: 08/11/2021 11:00 AM Medical Record Number: [id number] Patient Account Number: [id number] Date of Birth/Sex: 01-Nov-1945 (75 y.o. M) Treating RN: Carlene Coria Primary Care Arden Tinoco: Jonetta Osgood Other Clinician: Referring Londell Noll: [name] Treating Tylerjames Hoglund/Extender: Yaakov Guthrie in Treatment:  19 Clinic Level of Care Assessment Items TOOL 1 Quantity Score '[]'$  - Use when EandM and Procedure is performed on INITIAL visit 0 ASSESSMENTS - Nursing Assessment / Reassessment '[]'$  - General Physical Exam (combine w/ comprehensive assessment (listed just below) when performed on new 0 pt. evals) '[]'$  - 0 Comprehensive Assessment (HX, ROS, Risk Assessments, Wounds Hx, etc.) ASSESSMENTS - Wound and Skin Assessment / Reassessment '[]'$  - Dermatologic / Skin Assessment (not related to wound area) 0 ASSESSMENTS - Ostomy and/or Continence Assessment and Care '[]'$  - Incontinence Assessment and Management 0 '[]'$  - 0 Ostomy Care Assessment and Management (repouching, etc.) PROCESS - Coordination of Care '[]'$  - Simple Patient / Family Education for ongoing care 0 '[]'$  - 0 Complex (extensive) Patient / Family Education for ongoing care '[]'$  - 0 Staff obtains Programmer, systems, Records, Test Results / Process Orders '[]'$  - 0 Staff telephones HHA, Nursing Homes / Clarify orders / etc '[]'$  - 0 Routine Transfer to another Facility (non-emergent condition) '[]'$  - 0 Routine Hospital Admission (non-emergent condition) '[]'$  - 0 New Admissions / Biomedical engineer / Ordering NPWT, Apligraf, etc. '[]'$  - 0 Emergency Hospital Admission (emergent condition) PROCESS - Special Needs '[]'$  - Pediatric / Minor Patient Management 0 '[]'$  - 0 Isolation Patient Management '[]'$  - 0 Hearing / Language / Visual special needs '[]'$  - 0 Assessment of Community assistance (transportation, D/C planning, etc.) '[]'$  - 0 Additional assistance / Altered mentation '[]'$  - 0 Support Surface(s) Assessment (bed, cushion, seat, etc.) INTERVENTIONS - Miscellaneous '[]'$  - External ear exam 0 '[]'$  - 0 Patient Transfer (multiple staff / Civil Service fast streamer / Similar devices) '[]'$  - 0 Simple Staple / Suture removal (25 or less) '[]'$  - 0 Complex Staple / Suture removal (26 or more) '[]'$  - 0 Hypo/Hyperglycemic Management (do not check if billed separately) '[]'$  - 0 Ankle /  Brachial Index (ABI) - do not check if billed separately Has the patient been seen at the hospital within the last three  years: Yes Total Score: 0 Level Of Care: ____ Adam Merritt ([id number]) Electronic Signature(s) Signed: 08/15/2021 4:21:53 PM By: Carlene Coria RN Entered By: Carlene Coria on 08/12/2021 11:44:13 Adam Merritt ([id number]) -------------------------------------------------------------------------------- Compression Therapy Details Patient Name: Adam Merritt Date of Service: 08/11/2021 11:00 AM Medical Record Number: [id number] Patient Account Number: [id number] Date of Birth/Sex: Dec 07, 1945 (75 y.o. M) Treating RN: Carlene Coria Primary Care Ophelia Sipe: Jonetta Osgood Other Clinician: Referring Javonne Dorko: [name] Treating Temisha Murley/Extender: Yaakov Guthrie in Treatment: 19 Compression Therapy Performed for Wound Assessment: Wound #43 Right Calcaneus Performed By: Clinician Carlene Coria, RN Compression Type: Four Layer Post Procedure Diagnosis Same as Pre-procedure Electronic Signature(s) Signed: 08/12/2021 11:43:23 AM By: Carlene Coria RN Entered By: Carlene Coria on 08/12/2021 11:43:23 Adam Merritt ([id number]) -------------------------------------------------------------------------------- Compression Therapy Details Patient Name: Adam Merritt Date of Service: 08/11/2021 11:00 AM Medical Record Number: [id number] Patient Account Number: [id number] Date of Birth/Sex: July 20, 1945 (75 y.o. M) Treating RN: Carlene Coria Primary Care Cordie Beazley: Jonetta Osgood Other Clinician: Referring Masami Plata: [name] Treating Jahleel Stroschein/Extender: Yaakov Guthrie in Treatment: 19 Compression Therapy Performed for Wound Assessment: NonWound Condition Lymphedema - Left Leg Performed By: Clinician Carlene Coria, RN Compression Type: Four Layer Post Procedure Diagnosis Same as Pre-procedure Electronic Signature(s) Signed: 08/12/2021 11:43:45 AM By: Carlene Coria  RN Entered By: Carlene Coria on 08/12/2021 11:43:45 Adam Merritt ([id number]) -------------------------------------------------------------------------------- Encounter Discharge Information Details Patient Name: Adam Merritt Date of Service: 08/11/2021 11:00 AM Medical Record Number: [id number] Patient Account Number: [id number] Date of Birth/Sex: 03-Sep-1945 (75 y.o. M) Treating RN: Carlene Coria Primary Care Odie Rauen: Jonetta Osgood Other Clinician: Referring Cloyce Paterson: [name] Treating Ceria Suminski/Extender: Yaakov Guthrie in Treatment: 19 Encounter Discharge Information Items Discharge Condition: Stable Ambulatory Status: Walker Discharge Destination: Home Transportation: Private Auto Accompanied By: self Schedule Follow-up Appointment: Yes Clinical Summary of Care: Patient Declined Electronic Signature(s) Signed: 08/15/2021 4:21:53 PM By: Carlene Coria RN Entered By: Carlene Coria on 08/11/2021 12:55:55 Adam Merritt ([id number]) -------------------------------------------------------------------------------- Lower Extremity Assessment Details Patient Name: Adam Merritt Date of Service: 08/11/2021 11:00 AM Medical Record Number: [id number] Patient Account Number: [id number] Date of Birth/Sex: 1946-02-02 (75 y.o. M) Treating RN: Carlene Coria Primary Care Falan Hensler: Jonetta Osgood Other Clinician: Referring Jabreel Chimento: [name] Treating Blayn Whetsell/Extender: Yaakov Guthrie in Treatment: 19 Edema Assessment Assessed: [Left: No] [Right: No] [Left: Edema] [Right: :] Calf Left: Right: Point of Measurement: 32 cm From Medial Instep 47 cm 44 cm Ankle Left: Right: Point of Measurement: From Medial Instep 33 cm 27 cm Vascular Assessment Pulses: Dorsalis Pedis Palpable: [Left:Yes] [Right:Yes] Electronic Signature(s) Signed: 08/15/2021 4:21:53 PM By: Carlene Coria RN Entered By: Carlene Coria on 08/11/2021 11:27:19 Adam Merritt  ([id number]) -------------------------------------------------------------------------------- Multi Wound Chart Details Patient Name: Adam Merritt Date of Service: 08/11/2021 11:00 AM Medical Record Number: [id number] Patient Account Number: [id number] Date of Birth/Sex: [birth year] (75 y.o. 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] [N/A:N/A] Comorbid History: [43:Anemia, Lymphedema, Chronic Obstructive Pulmonary Disease (COPD), Sleep Apnea, Congestive Heart Failure, Coronary Artery Disease, Hypertension, Peripheral Venous Disease, Type II Diabetes, Osteoarthritis, Neuropathy, Received  Chemotherapy] [N/A:N/A] Date Acquired: [43:05/31/2021] [N/A:N/A] Weeks of Treatment: [43:10] [N/A:N/A]  Wound Status: [43:Open] [N/A:N/A] Wound Recurrence: [43:No] [N/A:N/A] Measurements L x W x D (cm) [43:0.1x0.1x0.1] [N/A:N/A] Area (cm) : [58:0.998] [N/A:N/A] Volume (cm) : [33:8.250] [N/A:N/A] % Reduction in Area: [43:99.50%] [N/A:N/A] % Reduction in Volume: [43:99.70%] [N/A:N/A] Classification: [43:Grade 1] [N/A:N/A] Exudate Amount: [43:Medium] [N/A:N/A] Exudate Type: [43:Sanguinous] [N/A:N/A] Exudate Color: [43:red] [N/A:N/A] Wound Margin: [43:Well defined, not attached] [N/A:N/A] Granulation Amount: [43:Large (67-100%)] [N/A:N/A] Granulation Quality: [43:Pink] [N/A:N/A] Necrotic Amount: [43:None Present (0%)] [N/A:N/A] Exposed Structures: [43:Fat Layer (Subcutaneous Tissue): Yes  Large (67-100%)] [N/A:N/A N/A] Treatment Notes Electronic Signature(s) Signed: 08/15/2021 4:21:53 PM By: Carlene Coria RN Entered By: Carlene Coria on 08/11/2021 12:48:48 Adam Merritt ([id number]) -------------------------------------------------------------------------------- [place] Details Patient Name: Adam Merritt Date of Service: 08/11/2021 11:00 AM Medical Record Number: [id number] Patient Account Number: [id number] Date of Birth/Sex: November 15, 1945 (75 y.o. M) Treating RN: Carlene Coria Primary Care Ulice Follett: Jonetta Osgood Other Clinician: Referring Amery Vandenbos: [name] Treating Cimberly Stoffel/Extender: Yaakov Guthrie in Treatment: 19 Active Inactive Venous Leg Ulcer Nursing Diagnoses: Actual venous Insuffiency (use after diagnosis is confirmed) Goals: Patient/caregiver will verbalize understanding of disease process and disease management Date Initiated: 08/04/2021 Target Resolution Date: 08/18/2021 Goal Status: Active Verify adequate tissue perfusion prior to therapeutic compression application Date Initiated: 08/04/2021 Target Resolution Date: 08/18/2021 Goal Status: Active Interventions: Assess peripheral edema status every visit. Compression as ordered Notes: Electronic Signature(s) Signed: 08/15/2021 4:21:53 PM By: Carlene Coria RN Entered By: Carlene Coria on 08/11/2021 12:48:36 Adam Merritt ([id number]) -------------------------------------------------------------------------------- Pain Assessment Details Patient Name: Adam Merritt Date of Service: 08/11/2021 11:00 AM Medical Record Number: [id number] Patient Account Number: [id number] Date of Birth/Sex: January 23, 1946 (75 y.o. M) Treating RN: Carlene Coria Primary Care Swayze Kozuch: Jonetta Osgood Other Clinician: Referring Kellar Westberg: [name] Treating Cortez Flippen/Extender: Yaakov Guthrie in Treatment: 19 Active Problems Location of Pain Severity and Description of Pain Patient Has  Paino No Site Locations Pain Management and Medication Current Pain Management: Electronic Signature(s) Signed: 08/15/2021 4:21:53 PM By: Carlene Coria RN Entered By: Carlene Coria on 08/11/2021 11:17:45 Adam Merritt ([id number]) -------------------------------------------------------------------------------- Patient/Caregiver Education Details Patient Name: Adam Merritt Date of Service: 08/11/2021 11:00 AM Medical Record Number: [id number] Patient Account Number: [id number] Date of Birth/Gender: [birth year] (75 y.o. M) Treating RN: Carlene Coria Primary Care Physician: Jonetta Osgood Other Clinician: Referring Physician: [name] Treating Physician/Extender: Yaakov Guthrie in Treatment: 39 Education Assessment Education Provided To: Patient Education Topics Provided Wound/Skin Impairment: Methods: Explain/Verbal Responses: State content correctly Electronic Signature(s) Signed: 08/15/2021 4:21:53 PM By: Carlene Coria RN Entered By: Carlene Coria on 08/11/2021 12:54:14 Adam Merritt ([id number]) -------------------------------------------------------------------------------- Wound Assessment Details Patient Name: Adam Merritt Date of Service: 08/11/2021 11:00 AM Medical Record Number: [id number] Patient Account Number: [id number] Date of Birth/Sex: 1946-02-24 (75 y.o. M) Treating RN: Carlene Coria Primary Care Vishruth Seoane: Jonetta Osgood Other Clinician: Referring Tashea Othman: [name] Treating Krystofer Hevener/Extender: Yaakov Guthrie in Treatment: 19 Wound Status Wound Number: 43 Primary Diabetic Wound/Ulcer of the Lower Extremity Etiology: Wound Location: Right Calcaneus Wound Open Wounding Event: Gradually Appeared Status: Date Acquired: 05/31/2021 Comorbid Anemia, Lymphedema, Chronic Obstructive Pulmonary Weeks Of Treatment: 10 History: Disease (COPD), Sleep Apnea, Congestive Heart Failure, Clustered Wound: No Coronary Artery Disease, Hypertension,  Peripheral Venous Disease, Type II Diabetes, Osteoarthritis, Neuropathy, Received Chemotherapy Wound Measurements Length: (cm) 0.1 Width: (cm) 0.1 Depth: (cm) 0.1 Area: (cm) 0.008 Volume: (cm) 0.001 % Reduction in Area: 99.5% % Reduction in Volume: 99.7% Epithelialization: Large (67-100%) Tunneling: No Undermining: No Wound Description Classification: Grade 1 Wound Margin: Well  defined, not attached Exudate Amount: Medium Exudate Type: Sanguinous Exudate Color: red Foul Odor After Cleansing: No Slough/Fibrino No Wound Bed Granulation Amount: Large (67-100%) Exposed Structure Granulation Quality: Pink Fat Layer (Subcutaneous Tissue) Exposed: Yes Necrotic Amount: None Present (0%) Treatment Notes Wound #43 (Calcaneus) Wound Laterality: Right Cleanser Soap and Water Discharge Instruction: Gently cleanse wound with antibacterial soap, rinse and pat dry prior to dressing wounds Peri-Wound Care Topical Triamcinolone Acetonide Cream, 0.1%, 15 (g) tube Discharge Instruction: Apply as directed by Alberto Schoch. Primary Dressing Silvercel Small 2x2 (in/in) Discharge Instruction: Apply Silvercel Small 2x2 (in/in) as instructed Secondary Dressing ABD Pad 5x9 (in/in) Discharge Instruction: Cover with ABD pad Secured With Medipore Tape - 51M Medipore H Soft Cloth Surgical Tape, 2x2 (in/yd) Compression Wrap KEONDRICK, DILKS. ([id number]) Medichoice 4 layer Compression System, 35-40 mmHG Discharge Instruction: Apply multi-layer wrap as directed. Compression Stockings Add-Ons Electronic Signature(s) Signed: 08/15/2021 4:21:53 PM By: Carlene Coria RN Entered By: Carlene Coria on 08/11/2021 11:33:56 Adam Merritt ([id number]) -------------------------------------------------------------------------------- Vitals Details Patient Name: Adam Merritt Date of Service: 08/11/2021 11:00 AM Medical Record Number: [id number] Patient Account Number: [id number] Date of Birth/Sex: 04/27/1946 (75  y.o. M) Treating RN: Carlene Coria Primary Care Lerin Jech: Jonetta Osgood Other Clinician: Referring Senna Lape: [name] Treating Broady Lafoy/Extender: Yaakov Guthrie in Treatment: 19 Vital Signs Time Taken: 11:17 Temperature (F): 98.2 Height (in): 66 Pulse (bpm): 67 Weight (lbs): 300 Respiratory Rate (breaths/min): 18 Body Mass Index (BMI): 48.4 Blood Pressure (mmHg): 144/77 Reference Range: 80 - 120 mg / dl Electronic Signature(s) Signed: 08/15/2021 4:21:53 PM By: Carlene Coria RN Entered By: Carlene Coria on 08/11/2021 11:17:31

## 2021-08-18 ENCOUNTER — Encounter: Payer: Medicare PPO | Admitting: Physician Assistant

## 2021-08-18 ENCOUNTER — Other Ambulatory Visit: Payer: Self-pay

## 2021-08-18 DIAGNOSIS — L89612 Pressure ulcer of right heel, stage 2: Secondary | ICD-10-CM | POA: Diagnosis not present

## 2021-08-18 DIAGNOSIS — E11621 Type 2 diabetes mellitus with foot ulcer: Secondary | ICD-10-CM | POA: Diagnosis not present

## 2021-08-18 DIAGNOSIS — I872 Venous insufficiency (chronic) (peripheral): Secondary | ICD-10-CM | POA: Diagnosis not present

## 2021-08-18 DIAGNOSIS — I890 Lymphedema, not elsewhere classified: Secondary | ICD-10-CM | POA: Diagnosis not present

## 2021-08-18 DIAGNOSIS — L97812 Non-pressure chronic ulcer of other part of right lower leg with fat layer exposed: Secondary | ICD-10-CM | POA: Diagnosis not present

## 2021-08-18 DIAGNOSIS — L97822 Non-pressure chronic ulcer of other part of left lower leg with fat layer exposed: Secondary | ICD-10-CM | POA: Diagnosis not present

## 2021-08-18 NOTE — Progress Notes (Addendum)
Adam Merritt ([id number]) ?Visit Report for 08/18/2021 ?Chief Complaint Document Details ?Patient Name: Adam Merritt, Adam Merritt. ?Date of Service: 08/18/2021 12:30 PM ?Medical Record Number: [id number] ?Patient Account Number: [id number] ?Date of Birth/Sex: May 08, 1946 (76 y.o. M) ?Treating RN: Carlene Coria ?Primary Care Provider: Jonetta Osgood Other Clinician: ?Referring Provider: [name] ?Treating Provider/Extender: Jeri Cos ?[name]: 20 ?Information Obtained from: Patient ?Chief Complaint ?Bilateral LE edema and weeping ulcers ?Electronic Signature(s) ?Signed: 08/18/2021 12:56:08 PM By: Worthy Keeler PA-C ?Entered By: Worthy Keeler on 08/18/2021 12:56:08 ?Adam Merritt ([id number]) ?-------------------------------------------------------------------------------- ?HPI Details ?Patient Name: Adam Merritt, Adam Merritt. ?Date of Service: 08/18/2021 12:30 PM ?Medical Record Number: [id number] ?Patient Account Number: [id number] ?Date of Birth/Sex: 1946-01-23 (76 y.o. M) ?Treating RN: Carlene Coria ?Primary Care Provider: Jonetta Osgood Other Clinician: ?Referring Provider: [name] ?Treating Provider/Extender: Jeri Cos ?[name]: 20 ?History of Present Illness ?HPI Description: 10/18/17-He is here for initial evaluation of bilateral lower extremity ulcerations in the presence of venous insufficiency and ?lymphedema. He has been seen by vascular medicine in the past, Dr. Lucky Cowboy, last seen in 2016. He does have a history of abnormal ABIs, which is ?to be expected given his lymphedema and venous insufficiency. According to Epic, it appears that all attempts for arterial evaluation and/or ?angiography were not follow through with by patient. He does have a history of being seen in lymphedema clinic in 2018, stopped going ?approximately 6 months ago stating "it didn't do any good". He does not have lymphedema pumps, he does not have custom fit compression ?wrap/stockings. He is diabetic and his recent A1c last  month was 7.6. He admits to chronic bilateral lower extremity pain, no change in pain since ?blister and ulceration development. He is currently being treated with Levaquin for bronchitis. He has home health and we will continue. ?10/25/17-He is here in follow-up evaluation for bilateral lower extremity ulcerationssubtle he remains on Levaquin for bronchitis. Right lower ?extremity with no evidence of drainage or ulceration, persistent left lower extremity ulceration. He states that home health has not been out since ?his appointment. He went to [facility] on Tuesday, studies revealed: RIGHT ABI 0.9, TBI 0.6 LEFT ABI 1.1, TBI 0.6 with ?triphasic flow bilaterally. We will continue his same treatment plan. He has been educated on compression therapy and need for elevation. He will ?benefit from lymphedema pumps ?11/01/17-He is here in follow-up evaluation for left lower extremity ulcer. The right lower extremity remains healed. He has home health services, ?but they have not been out to see the patient for 2-3 weeks. He states it home health physical therapy changed his dressing yesterday after ?therapy; he placed Ace wrap compression. We are still waiting for lymphedema pumps, reordered d/t need for company change. ?11/08/17-He is here in follow-up evaluation for left lower extremity ulcer. It is improved. Edema is significantly improved with compression therapy. ?We will continue with same treatment plan and he will follow-up next week. No word regarding lymphedema pumps ?11/15/17-He is here in follow-up evaluation for left lower extremity ulcer. He is healed and will be discharged from wound care services. I have ?reached out to [facility] regarding his lymphedema pumps. They have been unable to reach the patient; the contact number they had ?with the patient's wife's cell phone and she has not answered any unrecognized calls. Contact should be made today, trial planned for next week; ?[facility] will continue to follow ?11/27/17 on evaluation today patient has multiple blistered areas over the right  lower extremity his left lower extremity appears to be doing okay. ?These blistered areas show signs of no infection which is great news. With that being said he did have some necrotic skin overlying which was ?mechanically debrided away with saline and gauze today without complication. Overall post debridement the wounds appear to be doing better ?but in general his swelling seems to be increased. This is obviously not good news. I think this is what has given rise to the blisters. ?12/04/17 on evaluation today patient presents for follow-up concerning his bilateral lower extremity edema in the right lower extremity ulcers. He ?has been tolerating the dressing changes without complication. With that being said he has had no real issues with the wraps which is also good ?news. Overall I'm pleased with the progress he's been making. ?12/11/17 on evaluation today patient appears to be doing rather well in regard to his right lateral lower extremity ulcer. He's been tolerating the ?dressing changes without complication. Fortunately there does not appear to be any evidence of infection at this time. Overall I'm pleased with ?the progress that is being made. Unfortunately he has been in the hospital due to having what sounds to be a stomach virus/flu fortunately that is ?starting to get better. ?12/18/17 on evaluation today patient actually appears to be doing very well in regard to his bilateral lower extremities the swelling is under fairly ?good control his lymphedema pumps are still not up and running quite yet. With that being said he does have several areas of opening noted as ?far as wounds are concerned mainly over the left lower extremity. With that being said I do believe once he gets lymphedema pumps this would ?least help you mention some the fluid and preventing this from occurring. Hopefully that  will be set up soon sleeves are Artie in place at his home ?he just waiting for the machine. ?12/25/17 on evaluation today patient actually appears to be doing excellent in fact all of his ulcers appear to have resolved his legs appear very ?well. I do think he needs compression stockings we have discussed this and they are actually going to go to [place] today to elastic therapy to ?get this fitted for him. I think that is definitely a good thing to do. ?Readmission: ?04/09/18 upon evaluation today this patient is seen for readmission due to bilateral lower extremity lymphedema. He has significant swelling of his ?extremities especially on the left although the right is also swollen he has weeping from both sides. There are no obvious open wounds at this ?point. Fortunately he has been doing fairly well for quite a bit of time since I last saw him. Nonetheless unfortunately this seems to have reopened ?and is giving quite a bit of trouble. He states this began about a week ago when he first called [place] to get in to be seen. No fevers, chills, nausea, or ?vomiting noted at this time. He has not been using his lymphedema pumps due to the fact that they won't fit on his leg at this point likewise is ?also not been using his compression for essentially the same reason. ?04/16/18 upon evaluation today patient actually appears to be doing a little better in regard to the fluid in his bilateral lower extremities. With ?that being said he's had three falls since I saw him last week. He also states that he's been feeling very poorly. I was concerned last week and feel ?like that the concern is still there as far as the congestion  in his chest is concerned he seems to be breathing about the same as last week but ?again he states he's very weak he's not even able to walk further than from the chair to the door. His wife had to buy a wheelchair just to be able ?to get them out of the house to get to the appointment today. This  has me very concerned. ?04/23/18 on evaluation today patient actually appears to be doing much better than last week's evaluation. At that time actually had to transport ?him to the ER via EMS and he subsequ

## 2021-08-19 NOTE — Progress Notes (Signed)
Adam Merritt ([id number]) ?Visit Report for 08/18/2021 ?Arrival Information Details ?Patient Name: Adam Merritt, Adam Merritt. ?Date of Service: 08/18/2021 12:30 PM ?Medical Record Number: [id number] ?Patient Account Number: [id number] ?Date of Birth/Sex: September 16, 1945 (76 y.o. M) ?Treating RN: Carlene Coria ?Primary Care Maleigh Bagot: Jonetta Osgood Other Clinician: ?Referring Nalea Salce: [name] ?Treating Candid Bovey/Extender: Jeri Cos ?[name]: 20 ?Visit Information History Since Last Visit ?All ordered tests and consults were completed: No ?Patient Arrived: Wheel Chair ?Added or deleted any medications: No ?Arrival Time: 12:34 ?Any new allergies or adverse reactions: No ?Accompanied By: wife ?Had a fall or experienced change in No ?Transfer Assistance: None ?activities of daily living that may affect ?Patient Identification Verified: Yes ?risk of falls: ?Secondary Verification Process Completed: Yes ?Signs or symptoms of abuse/neglect since last visito No ?Patient Requires Transmission-Based Precautions: No ?Hospitalized since last visit: No ?Patient Has Alerts: Yes ?Implantable device outside of the clinic excluding No ?Patient Alerts: diabetic type 2 ?cellular tissue based products placed in the center ?since last visit: ?Has Dressing in Place as Prescribed: Yes ?Has Compression in Place as Prescribed: Yes ?Pain Present Now: No ?Electronic Signature(s) ?Signed: 08/19/2021 4:20:50 PM By: Carlene Coria RN ?Entered By: Carlene Coria on 08/18/2021 12:41:49 ?ZACHARIA, SOWLES ([id number]) ?-------------------------------------------------------------------------------- ?Clinic Level of Care Assessment Details ?Patient Name: Adam Merritt. ?Date of Service: 08/18/2021 12:30 PM ?Medical Record Number: [id number] ?Patient Account Number: [id number] ?Date of Birth/Sex: June 02, 1946 (76 y.o. M) ?Treating RN: Carlene Coria ?Primary Care Marlayna Bannister: Jonetta Osgood Other Clinician: ?Referring Eliott Amparan: [name] ?Treating  Geniyah Eischeid/Extender: Jeri Cos ?[name]: 20 ?Clinic Level of Care Assessment Items ?TOOL 1 Quantity Score ?'[]'$  - Use when EandM and Procedure is performed on INITIAL visit 0 ?ASSESSMENTS - Nursing Assessment / Reassessment ?'[]'$  - General Physical Exam (combine w/ comprehensive assessment (listed just below) when performed on new ?0 ?pt. evals) ?'[]'$  - 0 ?Comprehensive Assessment (HX, ROS, Risk Assessments, Wounds Hx, etc.) ?ASSESSMENTS - Wound and Skin Assessment / Reassessment ?'[]'$  - Dermatologic / Skin Assessment (not related to wound area) 0 ?ASSESSMENTS - Ostomy and/or Continence Assessment and Care ?'[]'$  - Incontinence Assessment and Management 0 ?'[]'$  - 0 ?Ostomy Care Assessment and Management (repouching, etc.) ?PROCESS - Coordination of Care ?'[]'$  - Simple Patient / Family Education for ongoing care 0 ?'[]'$  - 0 ?Complex (extensive) Patient / Family Education for ongoing care ?'[]'$  - 0 ?Staff obtains Consents, Records, Test Results / Process Orders ?'[]'$  - 0 ?Staff telephones HHA, Nursing Homes / Clarify orders / etc ?'[]'$  - 0 ?Routine Transfer to another Facility (non-emergent condition) ?'[]'$  - 0 ?Routine Hospital Admission (non-emergent condition) ?'[]'$  - 0 ?New Admissions / Biomedical engineer / Ordering NPWT, Apligraf, etc. ?'[]'$  - 0 ?Emergency Hospital Admission (emergent condition) ?PROCESS - Special Needs ?'[]'$  - Pediatric / Minor Patient Management 0 ?'[]'$  - 0 ?Isolation Patient Management ?'[]'$  - 0 ?Hearing / Language / Visual special needs ?'[]'$  - 0 ?Assessment of Community assistance (transportation, D/C planning, etc.) ?'[]'$  - 0 ?Additional assistance / Altered mentation ?'[]'$  - 0 ?Support Surface(s) Assessment (bed, cushion, seat, etc.) ?INTERVENTIONS - Miscellaneous ?'[]'$  - External ear exam 0 ?'[]'$  - 0 ?Patient Transfer (multiple staff / Civil Service fast streamer / Similar devices) ?'[]'$  - 0 ?Simple Staple / Suture removal (25 or less) ?'[]'$  - 0 ?Complex Staple / Suture removal (26 or more) ?'[]'$  - 0 ?Hypo/Hyperglycemic Management (do not  check if billed separately) ?'[]'$  - 0 ?Ankle / Brachial Index (ABI) - do not check if billed separately ?Has the patient been seen  at the hospital within the last three years: Yes ?Total Score: 0 ?Level Of Care: ____ ?RIYAD, KEENA ([id number]) ?Electronic Signature(s) ?Signed: 08/19/2021 4:20:50 PM By: Carlene Coria RN ?Entered By: Carlene Coria on 08/18/2021 13:22:55 ?[name] ([id number]) ?-------------------------------------------------------------------------------- ?Compression Therapy Details ?Patient Name: Adam Merritt. ?Date of Service: 08/18/2021 12:30 PM ?Medical Record Number: [id number] ?Patient Account Number: [id number] ?Date of Birth/Sex: 08/03/1945 (76 y.o. M) ?Treating RN: Carlene Coria ?Primary Care Frankee Gritz: Jonetta Osgood Other Clinician: ?Referring Jasma Seevers: [name] ?Treating Vardaan Depascale/Extender: Jeri Cos ?[name]: 20 ?Compression Therapy Performed for Wound Assessment: NonWound Condition Lymphedema - Right Leg ?Performed By: Clinician Carlene Coria, RN ?Compression Type: Four Layer ?Post Procedure Diagnosis ?Same as Pre-procedure ?Electronic Signature(s) ?Signed: 08/19/2021 4:20:50 PM By: Carlene Coria RN ?Entered By: Carlene Coria on 08/18/2021 13:21:33 ?[name] ([id number]) ?-------------------------------------------------------------------------------- ?Compression Therapy Details ?Patient Name: Adam Merritt. ?Date of Service: 08/18/2021 12:30 PM ?Medical Record Number: [id number] ?Patient Account Number: [id number] ?Date of Birth/Sex: 1945-08-04 (76 y.o. M) ?Treating RN: Carlene Coria ?Primary Care Osiah Haring: Jonetta Osgood Other Clinician: ?Referring Donie Moulton: [name] ?Treating Jen Benedict/Extender: Jeri Cos ?[name]: 20 ?Compression Therapy Performed for Wound Assessment: NonWound Condition Lymphedema - Left Leg ?Performed By: Clinician Carlene Coria, RN ?Compression Type: Four Layer ?Post Procedure Diagnosis ?Same as Pre-procedure ?Electronic  Signature(s) ?Signed: 08/19/2021 4:20:50 PM By: Carlene Coria RN ?Entered By: Carlene Coria on 08/18/2021 13:21:52 ?[name] ([id number]) ?-------------------------------------------------------------------------------- ?Encounter Discharge Information Details ?Patient Name: REEVE, TURNLEY. ?Date of Service: 08/18/2021 12:30 PM ?Medical Record Number: [id number] ?Patient Account Number: [id number] ?Date of Birth/Sex: Sep 28, 1945 (76 y.o. M) ?Treating RN: Carlene Coria ?Primary Care Aashi Derrington: Jonetta Osgood Other Clinician: ?Referring Sol Odor: [name] ?Treating Annmarie Plemmons/Extender: Jeri Cos ?[name]: 20 ?Encounter Discharge Information Items ?Discharge Condition: Stable ?Ambulatory Status: Wheelchair ?Discharge Destination: Home ?Transportation: Private Auto ?Accompanied By: wife ?Schedule Follow-up Appointment: Yes ?Clinical Summary of Care: Patient Declined ?Electronic Signature(s) ?Signed: 08/19/2021 4:20:50 PM By: Carlene Coria RN ?Entered By: Carlene Coria on 08/18/2021 13:24:06 ?[name] ([id number]) ?-------------------------------------------------------------------------------- ?Lower Extremity Assessment Details ?Patient Name: JUSTIN, MEISENHEIMER. ?Date of Service: 08/18/2021 12:30 PM ?Medical Record Number: [id number] ?Patient Account Number: [id number] ?Date of Birth/Sex: 08-24-1945 (76 y.o. M) ?Treating RN: Carlene Coria ?Primary Care Naylin Burkle: Jonetta Osgood Other Clinician: ?Referring Klaira Pesci: [name] ?Treating Carliss Quast/Extender: Jeri Cos ?[name]: 20 ?Edema Assessment ?Assessed: [Left: No] [Right: No] ?Edema: [Left: Yes] [Right: Yes] ?Calf ?Left: Right: ?Point of Measurement: 32 cm From Medial Instep 45 cm 41 cm ?Ankle ?Left: Right: ?Point of Measurement: 10 cm From Medial Instep 33 cm 27 cm ?Vascular Assessment ?Pulses: ?Dorsalis Pedis ?Palpable: [Left:Yes] [Right:Yes] ?Electronic Signature(s) ?Signed: 08/19/2021 4:20:50 PM By: Carlene Coria RN ?Entered By: Carlene Coria  on 08/18/2021 12:51:59 ?[name] ([id number]) ?-------------------------------------------------------------------------------- ?Multi Wound Chart Details ?Patient Name: RANULFO, KALL. ?Date of Service: 08/18/2021 12:

## 2021-08-22 ENCOUNTER — Encounter: Payer: Self-pay | Admitting: Nurse Practitioner

## 2021-08-22 ENCOUNTER — Ambulatory Visit (INDEPENDENT_AMBULATORY_CARE_PROVIDER_SITE_OTHER): Payer: Medicare PPO | Admitting: Nurse Practitioner

## 2021-08-22 ENCOUNTER — Other Ambulatory Visit: Payer: Self-pay

## 2021-08-22 VITALS — BP 145/75 | HR 60 | Temp 98.1°F | Resp 16 | Ht 66.0 in

## 2021-08-22 DIAGNOSIS — E1142 Type 2 diabetes mellitus with diabetic polyneuropathy: Secondary | ICD-10-CM | POA: Diagnosis not present

## 2021-08-22 DIAGNOSIS — J449 Chronic obstructive pulmonary disease, unspecified: Secondary | ICD-10-CM

## 2021-08-22 DIAGNOSIS — E876 Hypokalemia: Secondary | ICD-10-CM

## 2021-08-22 DIAGNOSIS — R600 Localized edema: Secondary | ICD-10-CM

## 2021-08-22 DIAGNOSIS — I872 Venous insufficiency (chronic) (peripheral): Secondary | ICD-10-CM

## 2021-08-22 DIAGNOSIS — J4489 Other specified chronic obstructive pulmonary disease: Secondary | ICD-10-CM

## 2021-08-22 MED ORDER — FUROSEMIDE 40 MG PO TABS: 40.0000 mg | ORAL_TABLET | Freq: Every day | ORAL | 3 refills | Status: DC

## 2021-08-22 MED ORDER — POTASSIUM CHLORIDE CRYS ER 20 MEQ PO TBCR: 20.0000 meq | EXTENDED_RELEASE_TABLET | Freq: Two times a day (BID) | ORAL | 3 refills | Status: DC

## 2021-08-22 MED ORDER — GABAPENTIN 300 MG PO CAPS: ORAL_CAPSULE | ORAL | 0 refills | Status: DC

## 2021-08-22 MED ORDER — GABAPENTIN 300 MG PO CAPS: ORAL_CAPSULE | ORAL | 3 refills | Status: DC

## 2021-08-22 NOTE — Progress Notes (Signed)
[facility] ?[address] ?[place], [place] [address] ? ?Internal MEDICINE  ?Office Visit Note ? ?Patient Name: Adam Merritt ? [id number]  ?[id number] ? ?Date of Service: 08/22/2021 ? ?Chief Complaint  ?Patient presents with  ? Follow-up  ? ? ?HPI ?Even presents for a follow up visit for COPD and PFT results. He also is in need of medication refills. He reports that his breathing is stable right now and the lower extremity edema is improved as well.  ?FVC, FEV1 and FVC ratio are all mildly decreased on his most recent PFT, this remains consistent with mild obstructive lung disease. There have been no significant changes in his respiratory status.  ? ? ?Current Medication: ?Outpatient Encounter Medications as of 08/22/2021  ?Medication Sig  ? albuterol (VENTOLIN HFA) 108 (90 Base) MCG/ACT inhaler Inhale 2 puffs into the lungs every 6 (six) hours as needed for wheezing or shortness of breath.  ? amoxicillin-clavulanate (AUGMENTIN) 875-125 MG tablet Take 1 tablet by mouth 2 (two) times daily.  ? aspirin 325 MG EC tablet Take 325 mg by mouth daily.  ? atorvastatin (LIPITOR) 40 MG tablet Take 40 mg by mouth at bedtime.   ? benzonatate (TESSALON) 100 MG capsule Take 1 capsule (100 mg total) by mouth 2 (two) times daily as needed for cough.  ? carvedilol (COREG) 25 MG tablet Take 25 mg by mouth 2 (two) times daily with a meal.  ? Cholecalciferol (VITAMIN D3 PO) Take by mouth.  ? Continuous Blood Gluc Sensor (FREESTYLE LIBRE 2 SENSOR) MISC 1 Device by Does not apply route every 14 (fourteen) days.  ? docusate sodium (COLACE) 50 MG capsule Take 50 mg by mouth 2 (two) times daily.  ? febuxostat (ULORIC) 40 MG tablet Take 1 tablet (40 mg total) by mouth daily.  ? fluticasone (FLOVENT HFA) 110 MCG/ACT inhaler Take 2 puffs in am and pm  for copd // chronic bronchitis ( rinse mouth)  ? furosemide (LASIX) 40 MG tablet Take 1 tablet (40 mg total) by mouth daily.  ? gabapentin (NEURONTIN) 300 MG capsule TAKE 2 CAPSULES BY  MOUTH EVERY MORNING, 1 CAPSULE EVERY EVENING AND 2 CAPSULES AT NIGHT  ? [START ON 09/12/2021] gabapentin (NEURONTIN) 300 MG capsule TAKE 2 CAPSULES BY MOUTH EVERY MORNING, 1 CAPSULE EVERY EVENING AND 2 CAPSULES AT NIGHT  ? glucose blood test strip Use as instructed to check blood sugars twice daily  ? hydrALAZINE (APRESOLINE) 25 MG tablet Take 25 mg by mouth 3 (three) times daily.   ? Insulin Glargine (BASAGLAR KWIKPEN) 100 UNIT/ML INJECT 30 TO 36 UNITS UNDER THE SKIN EVERY DAY  ? Insulin Pen Needle (BD PEN NEEDLE NANO U/F) 32G X 4 MM MISC Use as directed with insulin DX e11.65  ? ipratropium-albuterol (DUONEB) 0.5-2.5 (3) MG/3ML SOLN Take 3 mLs by nebulization every 4 (four) hours as needed (for shortness of breath).  ? ketotifen (ZADITOR) 0.025 % ophthalmic solution INSTILL 1 DROP IN BOTH EYES TWICE A DAY  ? liraglutide (VICTOZA) 18 MG/3ML SOPN INJECT 1.'8MG'$  INTO THE SKIN DAILY  ? mometasone (ELOCON) 0.1 % lotion Apply three times weekly as directed.  ? Multiple Vitamin (MULTIVITAMIN WITH MINERALS) TABS tablet Take 1 tablet by mouth daily.  ? ondansetron (ZOFRAN) 4 MG tablet Take 1 tablet (4 mg total) by mouth every 8 (eight) hours as needed.  ? OneTouch Delica Lancets 70W MISC Use twice daily diag e11.65  ? pentoxifylline (TRENTAL) 400 MG CR tablet TAKE 1 TABLET BY MOUTH THREE TIMES  DAILY FOR CLAUDICATION  ? potassium chloride SA (KLOR-CON M) 20 MEQ tablet Take 1 tablet (20 mEq total) by mouth 2 (two) times daily.  ? prazosin (MINIPRESS) 1 MG capsule TAKE THREE CAPSULES BY MOUTH EVERY EVENING FOR NIGHTMARES  ? predniSONE (DELTASONE) 10 MG tablet Take one tab 3 x day for 3 days, then take one tab 2 x a day for 3 days and then take one tab a day for 3 days for copd  ? sennosides-docusate sodium (SENOKOT-S) 8.6-50 MG tablet Take 1-2 tablets by mouth 2 (two) times daily.  ? sertraline (ZOLOFT) 100 MG tablet Take 100 mg by mouth daily.   ? tiZANidine (ZANAFLEX) 2 MG tablet Take 1 tablet (2 mg total) by mouth 3 (three)  times daily.  ? traZODone (DESYREL) 50 MG tablet Take 75 mg by mouth at bedtime.  ? triamcinolone cream (KENALOG) 0.1 % Apply 1 application topically 2 (two) times daily.  ? [DISCONTINUED] furosemide (LASIX) 40 MG tablet TAKE 1 TABLET(40 MG) BY MOUTH DAILY  ? [DISCONTINUED] gabapentin (NEURONTIN) 300 MG capsule TAKE 2 CAPSULES BY MOUTH EVERY MORNING, 1 CAPSULE EVERY EVENING AND 2 CAPSULES AT NIGHT  ? [DISCONTINUED] potassium chloride SA (KLOR-CON M) 20 MEQ tablet TAKE 1 TABLET BY MOUTH TWICE DAILY  ? ?No facility-administered encounter medications on file as of 08/22/2021.  ? ? ?Surgical History: ?Past Surgical History:  ?Procedure Laterality Date  ? AMPUTATION TOE Right 06/23/2018  ? Procedure: AMPUTATION TOE;  Surgeon: Sharlotte Alamo, DPM;  Location: [facility] ORS;  Service: Podiatry;  Laterality: Right;  ? CHOLECYSTECTOMY    ? CORONARY ARTERY BYPASS GRAFT    ? ? ?Medical History: ?Past Medical History:  ?Diagnosis Date  ? Anemia   ? CAD (coronary artery disease)   ? Chest pain   ? Coronary artery disease   ? Diabetes mellitus without complication ([place])   ? Diastolic CHF ([place])   ? Esophageal reflux   ? Herpes zoster without mention of complication   ? Hyperlipidemia   ? Hypertension   ? Insomnia   ? Lumbago   ? Lymphedema   ? Neuropathy in diabetes [facility])   ? Obstructive chronic bronchitis without exacerbation ([place])   ? Other malaise and fatigue   ? Stroke [facility])   ? Varicose veins   ? ? ?Family History: ?Family History  ?Problem Relation Age of Onset  ? Diabetes Mother   ? Hyperlipidemia Mother   ? Hypertension Mother   ? Diabetes Father   ? Hyperlipidemia Father   ? Hypertension Father   ? CAD Father   ? ? ?Social History  ? ?Socioeconomic History  ? Marital status: Married  ?  Spouse name: Not on file  ? Number of children: Not on file  ? Years of education: Not on file  ? Highest education level: Not on file  ?Occupational History  ? Occupation: retired  ?Tobacco Use  ? Smoking status: Never  ? Smokeless tobacco: Never   ?Vaping Use  ? Vaping Use: Never used  ?Substance and Sexual Activity  ? Alcohol use: No  ?  Comment: quit alcohol 30 years ago  ? Drug use: No  ? Sexual activity: Not Currently  ?Other Topics Concern  ? Not on file  ?Social History Narrative  ? Lives at home with wife, uses a wheelchair now  ? ?Social Determinants of Health  ? ?Financial Resource Strain: Not on file  ?Food Insecurity: Not on file  ?Transportation Needs: Not on file  ?Physical Activity:  Not on file  ?Stress: Not on file  ?Social Connections: Not on file  ?Intimate Partner Violence: Not on file  ? ? ? ? ?Review of Systems  ?Constitutional:  Negative for chills, fatigue and unexpected weight change.  ?HENT:  Negative for congestion, rhinorrhea, sneezing and sore throat.   ?Eyes:  Negative for redness.  ?Respiratory:  Positive for shortness of breath (intermittent) and wheezing (intermittent). Negative for cough and chest tightness.   ?Cardiovascular:  Negative for chest pain and palpitations.  ?Gastrointestinal:  Negative for abdominal pain, constipation, diarrhea, nausea and vomiting.  ?Genitourinary:  Negative for dysuria and frequency.  ?Musculoskeletal:  Negative for arthralgias, back pain, joint swelling and neck pain.  ?Skin:  Negative for rash.  ?Neurological: Negative.  Negative for tremors and numbness.  ?Hematological:  Negative for adenopathy. Does not bruise/bleed easily.  ?Psychiatric/Behavioral:  Negative for behavioral problems (Depression), sleep disturbance and suicidal ideas. The patient is not nervous/anxious.   ? ?Vital Signs: ?BP (!) 145/75   Pulse 60   Temp 98.1 ?F (36.7 ?C)   Resp 16   Ht '5\' 6"'$  (1.676 m)   SpO2 99%   BMI 48.42 kg/m?  ? ? ?Physical Exam ?Vitals reviewed.  ?Constitutional:   ?   General: He is not in acute distress. ?   Appearance: Normal appearance. He is obese. He is not ill-appearing.  ?HENT:  ?   Head: Normocephalic and atraumatic.  ?Eyes:  ?   Pupils: Pupils are equal, round, and reactive to light.   ?Cardiovascular:  ?   Rate and Rhythm: Normal rate and regular rhythm.  ?   Heart sounds: Normal heart sounds. No murmur heard. ?Pulmonary:  ?   Effort: Pulmonary effort is normal. No respiratory distress.  ?   B

## 2021-08-25 ENCOUNTER — Encounter: Payer: Medicare PPO | Admitting: Physician Assistant

## 2021-08-25 ENCOUNTER — Other Ambulatory Visit: Payer: Self-pay

## 2021-08-25 DIAGNOSIS — E11621 Type 2 diabetes mellitus with foot ulcer: Secondary | ICD-10-CM | POA: Diagnosis not present

## 2021-08-25 DIAGNOSIS — I890 Lymphedema, not elsewhere classified: Secondary | ICD-10-CM | POA: Diagnosis not present

## 2021-08-25 DIAGNOSIS — L97822 Non-pressure chronic ulcer of other part of left lower leg with fat layer exposed: Secondary | ICD-10-CM | POA: Diagnosis not present

## 2021-08-25 DIAGNOSIS — L89612 Pressure ulcer of right heel, stage 2: Secondary | ICD-10-CM | POA: Diagnosis not present

## 2021-08-25 DIAGNOSIS — L97812 Non-pressure chronic ulcer of other part of right lower leg with fat layer exposed: Secondary | ICD-10-CM | POA: Diagnosis not present

## 2021-08-25 DIAGNOSIS — I872 Venous insufficiency (chronic) (peripheral): Secondary | ICD-10-CM | POA: Diagnosis not present

## 2021-08-25 NOTE — Progress Notes (Addendum)
LYNDLE, PANG ([id number]) ?Visit Report for 08/25/2021 ?Chief Complaint Document Details ?Patient Name: DENNEY, SHEIN. ?Date of Service: 08/25/2021 12:30 PM ?Medical Record Number: [id number] ?Patient Account Number: [id number] ?Date of Birth/Sex: 04-16-1946 (76 y.o. M) ?Treating RN: Carlene Coria ?Primary Care Provider: Jonetta Osgood Other Clinician: ?Referring Provider: [name] ?Treating Provider/Extender: Jeri Cos ?[name]: 21 ?Information Obtained from: Patient ?Chief Complaint ?Bilateral LE edema and weeping ulcers ?Electronic Signature(s) ?Signed: 08/25/2021 12:56:04 PM By: Worthy Keeler PA-C ?Entered By: Worthy Keeler on 08/25/2021 12:56:03 ?ENDRIT, GITTINS ([id number]) ?-------------------------------------------------------------------------------- ?HPI Details ?Patient Name: KETRICK, MATNEY. ?Date of Service: 08/25/2021 12:30 PM ?Medical Record Number: [id number] ?Patient Account Number: [id number] ?Date of Birth/Sex: 1946-02-12 (76 y.o. M) ?Treating RN: Carlene Coria ?Primary Care Provider: Jonetta Osgood Other Clinician: ?Referring Provider: [name] ?Treating Provider/Extender: Jeri Cos ?[name]: 21 ?History of Present Illness ?HPI Description: 10/18/17-He is here for initial evaluation of bilateral lower extremity ulcerations in the presence of venous insufficiency and ?lymphedema. He has been seen by vascular medicine in the past, Dr. Lucky Cowboy, last seen in 2016. He does have a history of abnormal ABIs, which is ?to be expected given his lymphedema and venous insufficiency. According to Epic, it appears that all attempts for arterial evaluation and/or ?angiography were not follow through with by patient. He does have a history of being seen in lymphedema clinic in 2018, stopped going ?approximately 6 months ago stating "it didn't do any good". He does not have lymphedema pumps, he does not have custom fit compression ?wrap/stockings. He is diabetic and his recent A1c last  month was 7.6. He admits to chronic bilateral lower extremity pain, no change in pain since ?blister and ulceration development. He is currently being treated with Levaquin for bronchitis. He has home health and we will continue. ?10/25/17-He is here in follow-up evaluation for bilateral lower extremity ulcerationssubtle he remains on Levaquin for bronchitis. Right lower ?extremity with no evidence of drainage or ulceration, persistent left lower extremity ulceration. He states that home health has not been out since ?his appointment. He went to [facility] on Tuesday, studies revealed: RIGHT ABI 0.9, TBI 0.6 LEFT ABI 1.1, TBI 0.6 with ?triphasic flow bilaterally. We will continue his same treatment plan. He has been educated on compression therapy and need for elevation. He will ?benefit from lymphedema pumps ?11/01/17-He is here in follow-up evaluation for left lower extremity ulcer. The right lower extremity remains healed. He has home health services, ?but they have not been out to see the patient for 2-3 weeks. He states it home health physical therapy changed his dressing yesterday after ?therapy; he placed Ace wrap compression. We are still waiting for lymphedema pumps, reordered d/t need for company change. ?11/08/17-He is here in follow-up evaluation for left lower extremity ulcer. It is improved. Edema is significantly improved with compression therapy. ?We will continue with same treatment plan and he will follow-up next week. No word regarding lymphedema pumps ?11/15/17-He is here in follow-up evaluation for left lower extremity ulcer. He is healed and will be discharged from wound care services. I have ?reached out to [facility] regarding his lymphedema pumps. They have been unable to reach the patient; the contact number they had ?with the patient's wife's cell phone and she has not answered any unrecognized calls. Contact should be made today, trial planned for next week; ?[facility] will continue to follow ?11/27/17 on evaluation today patient has multiple blistered areas over the right  lower extremity his left lower extremity appears to be doing okay. ?These blistered areas show signs of no infection which is great news. With that being said he did have some necrotic skin overlying which was ?mechanically debrided away with saline and gauze today without complication. Overall post debridement the wounds appear to be doing better ?but in general his swelling seems to be increased. This is obviously not good news. I think this is what has given rise to the blisters. ?12/04/17 on evaluation today patient presents for follow-up concerning his bilateral lower extremity edema in the right lower extremity ulcers. He ?has been tolerating the dressing changes without complication. With that being said he has had no real issues with the wraps which is also good ?news. Overall I'm pleased with the progress he's been making. ?12/11/17 on evaluation today patient appears to be doing rather well in regard to his right lateral lower extremity ulcer. He's been tolerating the ?dressing changes without complication. Fortunately there does not appear to be any evidence of infection at this time. Overall I'm pleased with ?the progress that is being made. Unfortunately he has been in the hospital due to having what sounds to be a stomach virus/flu fortunately that is ?starting to get better. ?12/18/17 on evaluation today patient actually appears to be doing very well in regard to his bilateral lower extremities the swelling is under fairly ?good control his lymphedema pumps are still not up and running quite yet. With that being said he does have several areas of opening noted as ?far as wounds are concerned mainly over the left lower extremity. With that being said I do believe once he gets lymphedema pumps this would ?least help you mention some the fluid and preventing this from occurring. Hopefully that  will be set up soon sleeves are Artie in place at his home ?he just waiting for the machine. ?12/25/17 on evaluation today patient actually appears to be doing excellent in fact all of his ulcers appear to have resolved his legs appear very ?well. I do think he needs compression stockings we have discussed this and they are actually going to go to [place] today to elastic therapy to ?get this fitted for him. I think that is definitely a good thing to do. ?Readmission: ?04/09/18 upon evaluation today this patient is seen for readmission due to bilateral lower extremity lymphedema. He has significant swelling of his ?extremities especially on the left although the right is also swollen he has weeping from both sides. There are no obvious open wounds at this ?point. Fortunately he has been doing fairly well for quite a bit of time since I last saw him. Nonetheless unfortunately this seems to have reopened ?and is giving quite a bit of trouble. He states this began about a week ago when he first called [place] to get in to be seen. No fevers, chills, nausea, or ?vomiting noted at this time. He has not been using his lymphedema pumps due to the fact that they won't fit on his leg at this point likewise is ?also not been using his compression for essentially the same reason. ?04/16/18 upon evaluation today patient actually appears to be doing a little better in regard to the fluid in his bilateral lower extremities. With ?that being said he's had three falls since I saw him last week. He also states that he's been feeling very poorly. I was concerned last week and feel ?like that the concern is still there as far as the congestion  in his chest is concerned he seems to be breathing about the same as last week but ?again he states he's very weak he's not even able to walk further than from the chair to the door. His wife had to buy a wheelchair just to be able ?to get them out of the house to get to the appointment today. This  has me very concerned. ?04/23/18 on evaluation today patient actually appears to be doing much better than last week's evaluation. At that time actually had to transport ?him to the ER via EMS and he subsequ

## 2021-08-25 NOTE — Progress Notes (Signed)
CAYLEN, YARDLEY ([id number]) ?Visit Report for 08/25/2021 ?Arrival Information Details ?Patient Name: Adam Merritt, Adam Merritt. ?Date of Service: 08/25/2021 12:30 PM ?Medical Record Number: [id number] ?Patient Account Number: [id number] ?Date of Birth/Sex: January 15, 1946 (76 y.o. M) ?Treating RN: Carlene Coria ?Primary Care Jaloni Davoli: Jonetta Osgood Other Clinician: ?Referring Vencent Hauschild: [name] ?Treating Osmara Drummonds/Extender: Jeri Cos ?[name]: 21 ?Visit Information History Since Last Visit ?All ordered tests and consults were completed: No ?Patient Arrived: Wheel Chair ?Added or deleted any medications: No ?Arrival Time: 12:34 ?Any new allergies or adverse reactions: No ?Accompanied By: wife ?Had a fall or experienced change in Yes ?Transfer Assistance: None ?activities of daily living that may affect ?Patient Identification Verified: Yes ?risk of falls: ?Secondary Verification Process Completed: Yes ?Signs or symptoms of abuse/neglect since last visito No ?Patient Requires Transmission-Based Precautions: No ?Hospitalized since last visit: No ?Patient Has Alerts: Yes ?Implantable device outside of the clinic excluding No ?Patient Alerts: diabetic type 2 ?cellular tissue based products placed in the center ?since last visit: ?Has Dressing in Place as Prescribed: Yes ?Has Compression in Place as Prescribed: Yes ?Pain Present Now: No ?Electronic Signature(s) ?Signed: 08/25/2021 4:22:30 PM By: Carlene Coria RN ?Entered By: Carlene Coria on 08/25/2021 12:40:03 ?[name] ([id number]) ?-------------------------------------------------------------------------------- ?Clinic Level of Care Assessment Details ?Patient Name: Adam Merritt, Adam Merritt. ?Date of Service: 08/25/2021 12:30 PM ?Medical Record Number: [id number] ?Patient Account Number: [id number] ?Date of Birth/Sex: 1946/05/28 (76 y.o. M) ?Treating RN: Carlene Coria ?Primary Care Kela Baccari: Jonetta Osgood Other Clinician: ?Referring Delesa Kawa: [name] ?Treating  Mckenna Gamm/Extender: Jeri Cos ?[name]: 21 ?Clinic Level of Care Assessment Items ?TOOL 4 Quantity Score ?X - Use when only an EandM is performed on FOLLOW-UP visit 1 0 ?ASSESSMENTS - Nursing Assessment / Reassessment ?X - Reassessment of Co-morbidities (includes updates in patient status) 1 10 ?X- 1 5 ?Reassessment of Adherence to Treatment Plan ?ASSESSMENTS - Wound and Skin Assessment / Reassessment ?'[]'$  - Simple Wound Assessment / Reassessment - one wound 0 ?'[]'$  - 0 ?Complex Wound Assessment / Reassessment - multiple wounds ?'[]'$  - 0 ?Dermatologic / Skin Assessment (not related to wound area) ?ASSESSMENTS - Focused Assessment ?'[]'$  - Circumferential Edema Measurements - multi extremities 0 ?'[]'$  - 0 ?Nutritional Assessment / Counseling / Intervention ?'[]'$  - 0 ?Lower Extremity Assessment (monofilament, tuning fork, pulses) ?'[]'$  - 0 ?Peripheral Arterial Disease Assessment (using hand held doppler) ?ASSESSMENTS - Ostomy and/or Continence Assessment and Care ?'[]'$  - Incontinence Assessment and Management 0 ?'[]'$  - 0 ?Ostomy Care Assessment and Management (repouching, etc.) ?PROCESS - Coordination of Care ?X - Simple Patient / Family Education for ongoing care 1 15 ?'[]'$  - 0 ?Complex (extensive) Patient / Family Education for ongoing care ?'[]'$  - 0 ?Staff obtains Consents, Records, Test Results / Process Orders ?'[]'$  - 0 ?Staff telephones HHA, Nursing Homes / Clarify orders / etc ?'[]'$  - 0 ?Routine Transfer to another Facility (non-emergent condition) ?'[]'$  - 0 ?Routine Hospital Admission (non-emergent condition) ?'[]'$  - 0 ?New Admissions / Biomedical engineer / Ordering NPWT, Apligraf, etc. ?'[]'$  - 0 ?Emergency Hospital Admission (emergent condition) ?X- 1 10 ?Simple Discharge Coordination ?'[]'$  - 0 ?Complex (extensive) Discharge Coordination ?PROCESS - Special Needs ?'[]'$  - Pediatric / Minor Patient Management 0 ?'[]'$  - 0 ?Isolation Patient Management ?'[]'$  - 0 ?Hearing / Language / Visual special needs ?'[]'$  - 0 ?Assessment of  Community assistance (transportation, D/C planning, etc.) ?'[]'$  - 0 ?Additional assistance / Altered mentation ?'[]'$  - 0 ?Support Surface(s) Assessment (bed, cushion, seat, etc.) ?INTERVENTIONS - Wound Cleansing /  Measurement ?CHRISTINO, MCGLINCHEY ([id number]) ?'[]'$  - 0 ?Simple Wound Cleansing - one wound ?'[]'$  - 0 ?Complex Wound Cleansing - multiple wounds ?'[]'$  - 0 ?Wound Imaging (photographs - any number of wounds) ?'[]'$  - 0 ?Wound Tracing (instead of photographs) ?'[]'$  - 0 ?Simple Wound Measurement - one wound ?'[]'$  - 0 ?Complex Wound Measurement - multiple wounds ?INTERVENTIONS - Wound Dressings ?'[]'$  - Small Wound Dressing one or multiple wounds 0 ?'[]'$  - 0 ?Medium Wound Dressing one or multiple wounds ?'[]'$  - 0 ?Large Wound Dressing one or multiple wounds ?'[]'$  - 0 ?Application of Medications - topical ?'[]'$  - 0 ?Application of Medications - injection ?INTERVENTIONS - Miscellaneous ?'[]'$  - External ear exam 0 ?'[]'$  - 0 ?Specimen Collection (cultures, biopsies, blood, body fluids, etc.) ?'[]'$  - 0 ?Specimen(s) / Culture(s) sent or taken to Lab for analysis ?'[]'$  - 0 ?Patient Transfer (multiple staff / Civil Service fast streamer / Similar devices) ?'[]'$  - 0 ?Simple Staple / Suture removal (25 or less) ?'[]'$  - 0 ?Complex Staple / Suture removal (26 or more) ?'[]'$  - 0 ?Hypo / Hyperglycemic Management (close monitor of Blood Glucose) ?'[]'$  - 0 ?Ankle / Brachial Index (ABI) - do not check if billed separately ?X- 1 5 ?Vital Signs ?Has the patient been seen at the hospital within the last three years: Yes ?Total Score: 45 ?Level Of Care: New/Established - Level ?2 ?Electronic Signature(s) ?Signed: 08/25/2021 4:22:30 PM By: Carlene Coria RN ?Entered By: Carlene Coria on 08/25/2021 13:01:21 ?[name] ([id number]) ?-------------------------------------------------------------------------------- ?Encounter Discharge Information Details ?Patient Name: Adam Merritt, Adam Merritt. ?Date of Service: 08/25/2021 12:30 PM ?Medical Record Number: [id number] ?Patient Account Number: [id number] ?Date of  Birth/Sex: 09/07/1945 (76 y.o. M) ?Treating RN: Carlene Coria ?Primary Care Naeem Quillin: Jonetta Osgood Other Clinician: ?Referring Cristan Hout: [name] ?Treating Marisha Renier/Extender: Jeri Cos ?[name]: 21 ?Encounter Discharge Information Items ?Discharge Condition: Stable ?Ambulatory Status: Wheelchair ?Discharge Destination: Home ?Transportation: Private Auto ?Accompanied By: self ?Schedule Follow-up Appointment: Yes ?Clinical Summary of Care: Patient Declined ?Electronic Signature(s) ?Signed: 08/25/2021 4:22:30 PM By: Carlene Coria RN ?Entered By: Carlene Coria on 08/25/2021 13:02:27 ?[name] ([id number]) ?-------------------------------------------------------------------------------- ?Lower Extremity Assessment Details ?Patient Name: Adam Merritt, Adam Merritt. ?Date of Service: 08/25/2021 12:30 PM ?Medical Record Number: [id number] ?Patient Account Number: [id number] ?Date of Birth/Sex: 08-17-1945 (76 y.o. M) ?Treating RN: Carlene Coria ?Primary Care Mirela Parsley: Jonetta Osgood Other Clinician: ?Referring Khayri Kargbo: [name] ?Treating Romesha Scherer/Extender: Jeri Cos ?[name]: 21 ?Edema Assessment ?Assessed: [Left: No] [Right: No] ?Edema: [Left: No] [Right: No] ?Calf ?Left: Right: ?Point of Measurement: 32 cm From Medial Instep 45 cm 41 cm ?Ankle ?Left: Right: ?Point of Measurement: 10 cm From Medial Instep 33 cm 26 cm ?Vascular Assessment ?Pulses: ?Dorsalis Pedis ?Palpable: [Left:Yes] [Right:Yes] ?Electronic Signature(s) ?Signed: 08/25/2021 4:22:30 PM By: Carlene Coria RN ?Entered By: Carlene Coria on 08/25/2021 12:51:20 ?[name] ([id number]) ?-------------------------------------------------------------------------------- ?Multi Wound Chart Details ?Patient Name: Adam Merritt, Adam Merritt. ?Date of Service: 08/25/2021 12:30 PM ?Medical Record Number: [id number] ?Patient Account Number: [id number] ?Date of Birth/Sex: [birth year] (76 y.o. M) ?Treating RN: Carlene Coria ?Primary Care Arcangel Minion: Jonetta Osgood  Other Clinician: ?Referring Ishani Goldwasser: [name] ?Treating Yocheved Depner/Extender: Jeri Cos ?[name]: 21 ?Vital Signs ?Height(in): 66 ?Pulse(bpm): 89 ?Weight(lbs): 300 ?Blood Pressure(mmHg): 98/68 ?Body Mass Index(BMI

## 2021-09-01 ENCOUNTER — Encounter: Payer: Medicare PPO | Admitting: Physician Assistant

## 2021-09-04 ENCOUNTER — Encounter: Payer: Self-pay | Admitting: Nurse Practitioner

## 2021-10-05 ENCOUNTER — Other Ambulatory Visit: Payer: Self-pay | Admitting: Internal Medicine

## 2021-10-05 DIAGNOSIS — I890 Lymphedema, not elsewhere classified: Secondary | ICD-10-CM

## 2021-10-06 ENCOUNTER — Other Ambulatory Visit: Payer: Self-pay | Admitting: Nurse Practitioner

## 2021-10-06 DIAGNOSIS — E1142 Type 2 diabetes mellitus with diabetic polyneuropathy: Secondary | ICD-10-CM

## 2021-11-28 ENCOUNTER — Ambulatory Visit: Payer: Medicare PPO | Admitting: Nurse Practitioner

## 2021-12-23 ENCOUNTER — Other Ambulatory Visit: Payer: Self-pay

## 2021-12-23 ENCOUNTER — Telehealth: Payer: Self-pay

## 2021-12-23 MED ORDER — AZITHROMYCIN 250 MG PO TABS: 250.0000 mg | ORAL_TABLET | Freq: Every day | ORAL | 0 refills | Status: DC

## 2021-12-23 NOTE — Telephone Encounter (Signed)
Pt wife called that he is coughing  like bronchitis no other symptoms asper lauren send zpak for 5 days and continue neb as needed and advised if he is not feeling better need appt

## 2022-02-04 ENCOUNTER — Other Ambulatory Visit: Payer: Self-pay | Admitting: Nurse Practitioner

## 2022-02-04 DIAGNOSIS — E876 Hypokalemia: Secondary | ICD-10-CM

## 2022-02-07 ENCOUNTER — Ambulatory Visit: Payer: Medicare PPO | Admitting: Nurse Practitioner

## 2022-03-07 ENCOUNTER — Ambulatory Visit: Payer: Medicare PPO | Admitting: Nurse Practitioner

## 2022-03-10 ENCOUNTER — Other Ambulatory Visit: Payer: Self-pay | Admitting: Internal Medicine

## 2022-03-10 DIAGNOSIS — R600 Localized edema: Secondary | ICD-10-CM

## 2022-03-10 NOTE — Telephone Encounter (Signed)
Patient needs an appointment for future refills. 

## 2022-03-23 ENCOUNTER — Ambulatory Visit: Payer: Medicare PPO | Admitting: Nurse Practitioner

## 2022-03-27 ENCOUNTER — Encounter: Payer: Self-pay | Admitting: Nurse Practitioner

## 2022-03-28 ENCOUNTER — Telehealth (INDEPENDENT_AMBULATORY_CARE_PROVIDER_SITE_OTHER): Payer: Medicare PPO | Admitting: Nurse Practitioner

## 2022-03-28 ENCOUNTER — Encounter: Payer: Self-pay | Admitting: Nurse Practitioner

## 2022-03-28 ENCOUNTER — Telehealth: Payer: Self-pay

## 2022-03-28 VITALS — Ht 66.0 in | Wt 300.0 lb

## 2022-03-28 DIAGNOSIS — Z741 Need for assistance with personal care: Secondary | ICD-10-CM | POA: Diagnosis not present

## 2022-03-28 DIAGNOSIS — R6889 Other general symptoms and signs: Secondary | ICD-10-CM

## 2022-03-28 DIAGNOSIS — E1142 Type 2 diabetes mellitus with diabetic polyneuropathy: Secondary | ICD-10-CM

## 2022-03-28 DIAGNOSIS — R296 Repeated falls: Secondary | ICD-10-CM | POA: Diagnosis not present

## 2022-03-28 DIAGNOSIS — I890 Lymphedema, not elsewhere classified: Secondary | ICD-10-CM

## 2022-03-28 DIAGNOSIS — R531 Weakness: Secondary | ICD-10-CM

## 2022-03-28 DIAGNOSIS — I10 Essential (primary) hypertension: Secondary | ICD-10-CM

## 2022-03-28 DIAGNOSIS — L89309 Pressure ulcer of unspecified buttock, unspecified stage: Secondary | ICD-10-CM | POA: Diagnosis not present

## 2022-03-28 MED ORDER — BLOOD PRESSURE MONITOR AUTOMAT DEVI: 1.0000 | Freq: Once | 0 refills | Status: AC

## 2022-03-28 NOTE — Telephone Encounter (Signed)
Spoke with [facility] for home health and aide they will take care and also send message to [facility] for bedside commode and hospital bed

## 2022-03-28 NOTE — Progress Notes (Signed)
[facility] [place], [place] [address]  Internal MEDICINE  Telephone Visit  Patient Name: Adam Merritt  [id number]  [id number]  Date of Service: 03/28/2022  I connected with the patient at 1240 by video call and verified the patients identity using two identifiers.   I discussed the limitations, risks, security and privacy concerns of performing an evaluation and management service by telephone and the availability of in person appointments. I also discussed with the patient that there may be a patient responsible charge related to the service.  The patient expressed understanding and agrees to proceed.    Chief Complaint  Patient presents with   Telephone Assessment    [id number]   Telephone Screen    Home health    Fatigue    No energy cannot walk regarding he falls    Diabetes    HPI Rayshawn presents for a telehealth virtual visit for increased generalized weakness, declining functional status, activity intolerance.  --has no energy, multiple falls, poor balance, unable to stand up or walk.  Unable to complete ADLs including hygeine, grooming, toileting. Patient's weight is roughly 300 lbs and his spouse is unable to lift or move him.  Since patient is unable to move and she is unable to move him, he has open pressure ulcers (multiple) on his buttocks.  His bilateral lower extremity lymphedema is also worsening and his legs are weeping fluid. His spouse is unable to complete the wound care due to not having supplies and not knowing what type of dressing is needed.  Patient also has fatigue and diabetic neuropathy in his feet.     Current Medication: Outpatient Encounter Medications as of 03/28/2022  Medication Sig   albuterol (VENTOLIN HFA) 108 (90 Base) MCG/ACT inhaler Inhale 2 puffs into the lungs every 6 (six) hours as needed for wheezing or shortness of breath.   aspirin 325 MG EC tablet Take 325 mg by mouth daily.   atorvastatin (LIPITOR) 40 MG  tablet Take 40 mg by mouth at bedtime.    azithromycin (ZITHROMAX) 250 MG tablet Take 1 tablet (250 mg total) by mouth daily. Use as directed for 5 days   Blood Pressure Monitoring (BLOOD PRESSURE MONITOR AUTOMAT) DEVI 1 Device by Does not apply route once for 1 dose.   carvedilol (COREG) 25 MG tablet Take 25 mg by mouth 2 (two) times daily with a meal.   Cholecalciferol (VITAMIN D3 PO) Take by mouth.   Continuous Blood Gluc Sensor (FREESTYLE LIBRE 2 SENSOR) MISC 1 Device by Does not apply route every 14 (fourteen) days.   docusate sodium (COLACE) 50 MG capsule Take 50 mg by mouth 2 (two) times daily.   febuxostat (ULORIC) 40 MG tablet Take 1 tablet (40 mg total) by mouth daily.   fluticasone (FLOVENT HFA) 110 MCG/ACT inhaler Take 2 puffs in am and pm  for copd // chronic bronchitis ( rinse mouth)   furosemide (LASIX) 40 MG tablet TAKE 1 TABLET(40 MG) BY MOUTH DAILY   gabapentin (NEURONTIN) 300 MG capsule TAKE 2 CAPSULES BY MOUTH EVERY MORNING, 1 CAPSULE EVERY EVENING AND 2 CAPSULES AT NIGHT   gabapentin (NEURONTIN) 300 MG capsule TAKE 2 CAPSULES BY MOUTH EVERY MORNING, 1 CAPSULE EVERY EVENING, AND 2 CAPSULES AT NIGHT   glucose blood test strip Use as instructed to check blood sugars twice daily   hydrALAZINE (APRESOLINE) 25 MG tablet Take 25 mg by mouth 3 (three) times daily.    Insulin Glargine (BASAGLAR KWIKPEN) 100  UNIT/ML INJECT 30 TO 36 UNITS UNDER THE SKIN EVERY DAY   Insulin Pen Needle (BD PEN NEEDLE NANO U/F) 32G X 4 MM MISC Use as directed with insulin DX e11.65   ipratropium-albuterol (DUONEB) 0.5-2.5 (3) MG/3ML SOLN Take 3 mLs by nebulization every 4 (four) hours as needed (for shortness of breath).   liraglutide (VICTOZA) 18 MG/3ML SOPN INJECT 1.'8MG'$  INTO THE SKIN DAILY   mometasone (ELOCON) 0.1 % lotion Apply three times weekly as directed.   Multiple Vitamin (MULTIVITAMIN WITH MINERALS) TABS tablet Take 1 tablet by mouth daily.   ondansetron (ZOFRAN) 4 MG tablet Take 1 tablet (4 mg  total) by mouth every 8 (eight) hours as needed.   OneTouch Delica Lancets 75F MISC Use twice daily diag e11.65   pentoxifylline (TRENTAL) 400 MG CR tablet TAKE 1 TABLET BY MOUTH THREE TIMES DAILY FOR CLAUDICATION   potassium chloride SA (KLOR-CON M) 20 MEQ tablet TAKE 1 TABLET BY MOUTH TWICE DAILY   prazosin (MINIPRESS) 1 MG capsule TAKE THREE CAPSULES BY MOUTH EVERY EVENING FOR NIGHTMARES   predniSONE (DELTASONE) 10 MG tablet Take one tab 3 x day for 3 days, then take one tab 2 x a day for 3 days and then take one tab a day for 3 days for copd   sennosides-docusate sodium (SENOKOT-S) 8.6-50 MG tablet Take 1-2 tablets by mouth 2 (two) times daily.   sertraline (ZOLOFT) 100 MG tablet Take 100 mg by mouth daily.    tiZANidine (ZANAFLEX) 2 MG tablet Take 1 tablet (2 mg total) by mouth 3 (three) times daily.   traZODone (DESYREL) 50 MG tablet Take 75 mg by mouth at bedtime.   triamcinolone cream (KENALOG) 0.1 % Apply 1 application topically 2 (two) times daily.   [DISCONTINUED] amoxicillin-clavulanate (AUGMENTIN) 875-125 MG tablet Take 1 tablet by mouth 2 (two) times daily.   [DISCONTINUED] benzonatate (TESSALON) 100 MG capsule Take 1 capsule (100 mg total) by mouth 2 (two) times daily as needed for cough.   No facility-administered encounter medications on file as of 03/28/2022.    Surgical History: Past Surgical History:  Procedure Laterality Date   AMPUTATION TOE Right 06/23/2018   Procedure: AMPUTATION TOE;  Surgeon: Sharlotte Alamo, DPM;  Location: [facility] ORS;  Service: Podiatry;  Laterality: Right;   CHOLECYSTECTOMY     CORONARY ARTERY BYPASS GRAFT      Medical History: Past Medical History:  Diagnosis Date   Anemia    CAD (coronary artery disease)    Chest pain    Coronary artery disease    Diabetes mellitus without complication (HCC)    Diastolic CHF ([place])    Esophageal reflux    Herpes zoster without mention of complication    Hyperlipidemia    Hypertension    Insomnia     Lumbago    Lymphedema    Neuropathy in diabetes ([place])    Obstructive chronic bronchitis without exacerbation    Other malaise and fatigue    Stroke ([place])    Varicose veins     Family History: Family History  Problem Relation Age of Onset   Diabetes Mother    Hyperlipidemia Mother    Hypertension Mother    Diabetes Father    Hyperlipidemia Father    Hypertension Father    CAD Father     Social History   Socioeconomic History   Marital status: Married    Spouse name: Not on file   Number of children: Not on file   Years of education:  Not on file   Highest education level: Not on file  Occupational History   Occupation: retired  Tobacco Use   Smoking status: Never   Smokeless tobacco: Never  Vaping Use   Vaping Use: Never used  Substance and Sexual Activity   Alcohol use: No    Comment: quit alcohol 30 years ago   Drug use: No   Sexual activity: Not Currently  Other Topics Concern   Not on file  Social History Narrative   Lives at home with wife, uses a wheelchair now   Social Determinants of Health   Financial Resource Strain: [place]  (04/10/2019)   Overall Financial Resource Strain (CARDIA)    Difficulty of Paying Living Expenses: Not hard at all  Food Insecurity: No Food Insecurity (04/10/2019)   Hunger Vital Sign    Worried About Running Out of Food in the Last Year: Never true    [place] in the Last Year: Never true  Transportation Needs: No Transportation Needs (04/10/2019)   PRAPARE - Hydrologist (Medical): No    Lack of Transportation (Non-Medical): No  Physical Activity: Inactive (04/10/2019)   Exercise Vital Sign    Days of Exercise per Week: 0 days    Minutes of Exercise per Session: 0 min  Stress: No Stress Concern Present (04/10/2019)   [place]    Feeling of Stress : Only a little  Social Connections: Moderately Integrated (04/10/2019)    Social Connection and Isolation Panel [NHANES]    Frequency of Communication with Friends and Family: More than three times a week    Frequency of Social Gatherings with Friends and Family: More than three times a week    Attends Religious Services: More than 4 times per year    Active Member of [employer] or Organizations: No    Attends Archivist Meetings: Never    Marital Status: Married  Human resources officer Violence: Not At Risk (04/10/2019)   Humiliation, Afraid, Rape, and Kick questionnaire    Fear of Current or Ex-Partner: No    Emotionally Abused: No    Physically Abused: No    Sexually Abused: No      Review of Systems  Constitutional:  Positive for activity change and fatigue.  HENT:  Positive for congestion and postnasal drip.   Respiratory:  Positive for cough, chest tightness, shortness of breath and wheezing.   Cardiovascular: Negative.  Negative for chest pain and palpitations.  Gastrointestinal: Negative.  Negative for abdominal pain, constipation, diarrhea, nausea and vomiting.  Musculoskeletal:  Positive for arthralgias, back pain, gait problem and myalgias.  Skin:  Positive for wound (multiple).  Neurological:  Positive for weakness and numbness.  Hematological:  Positive for adenopathy.  Psychiatric/Behavioral:  Positive for behavioral problems, dysphoric mood and sleep disturbance. Negative for self-injury and suicidal ideas. The patient is nervous/anxious.     Vital Signs: Ht '5\' 6"'$  (1.676 m)   Wt 300 lb (136.1 kg)   BMI 48.42 kg/m    Observation/Objective: He is alert and oriented and engages in conversation appropriately. He does not appear to be in any acute distress over video call.     Assessment/Plan: 1. Self-care deficit for grooming and hygiene Patient is no longer able to perform ADLs for grooming, hygiene and toileting without significant assistance. Home health ordered for nursing, aide, physical therapy and wound care, DME ordered to help  with patient mobility and care.  -  Ambulatory referral to [place] home use only DME Bedside commode - For home use only DME Hospital bed  2. Lymphedema of both lower extremities Need home health nursing to come to his home and perform assessment of wounds and wound care - Ambulatory referral to [facility]  3. Generalized weakness Ordered DME for bedside commode and hospital bed, home health also ordered for physical therapy and to assist patient with ADLs - Ambulatory referral to [facility] - For home use only DME Bedside commode - For home use only DME Hospital bed  4. Unable to stand up Need home health physical thereapy to work with him, also DME ordered to help with patient mobility and care - Ambulatory referral to [facility] - For home use only DME Bedside commode - For home use only DME Hospital bed  5. Pressure injury of skin of buttock, unspecified injury stage, unspecified laterality Referred for home health for assessment of wounds and wound care. Hospital bed ordered to help decrease pressure on buttocks where some of his wounds are. - Ambulatory referral to [facility] - For home use only DME Hospital bed  6. Recurrent falls Home health ordered as well as DME that will decrease risk of falls and help with completing patient's ADLs safely.  - Ambulatory referral to [facility] home use only DME Bedside commode - For home use only DME Hospital bed  7. Diabetic polyneuropathy associated with type 2 diabetes mellitus ([place]) Contributes to difficulty with ADLs and ambulation, continue medication as prescribed.   8. Essential hypertension Need home BP monitor, order sent to pharmacy, continue medications as prescribed.  - Blood Pressure Monitoring (BLOOD PRESSURE MONITOR AUTOMAT) DEVI; 1 Device by Does not apply route once for 1 dose.  Dispense: 1 each; Refill: 0   General Counseling: Lothar verbalizes understanding of the findings of today's phone visit  and agrees with plan of treatment. I have discussed any further diagnostic evaluation that may be needed or ordered today. We also reviewed his medications today. he has been encouraged to call the office with any questions or concerns that should arise related to todays visit.  Return in about 1 month (around 04/28/2022) for F/U, Rubel Heckard PCP virtual ok.   Orders Placed This Encounter  Procedures   For home use only DME Bedside commode   For home use only DME Hospital bed   Ambulatory referral to [place] ordered this encounter  Medications   Blood Pressure Monitoring (BLOOD PRESSURE MONITOR AUTOMAT) DEVI    Sig: 1 Device by Does not apply route once for 1 dose.    Dispense:  1 each    Refill:  0    Dx I10 hypertension    Time spent:30 Minutes Time spent with patient included reviewing progress notes, labs, imaging studies, and discussing plan for follow up.  [place] Controlled Substance Database was reviewed by me for overdose risk score (ORS) if appropriate.  This patient was seen by Jonetta Osgood, FNP-C in collaboration with Dr. [name] as a part of collaborative care agreement.  Christina Gintz R. Valetta Fuller, MSN, FNP-C Internal medicine

## 2022-04-03 ENCOUNTER — Telehealth: Payer: Self-pay | Admitting: Nurse Practitioner

## 2022-04-03 NOTE — Telephone Encounter (Signed)
03/28/22 office notes faxed to [facility] for bedside commode; [phone]

## 2022-04-04 ENCOUNTER — Telehealth: Payer: Self-pay

## 2022-04-07 ENCOUNTER — Telehealth: Payer: Self-pay

## 2022-04-07 NOTE — Telephone Encounter (Signed)
Gave verbal order to [facility] [phone] to [facility] for nursing 3 times a week for 1 week ,1 times a week for 3 week and 1 times a week for 7 weeks

## 2022-04-10 ENCOUNTER — Other Ambulatory Visit: Payer: Self-pay

## 2022-04-10 DIAGNOSIS — E1165 Type 2 diabetes mellitus with hyperglycemia: Secondary | ICD-10-CM

## 2022-04-10 MED ORDER — BASAGLAR KWIKPEN 100 UNIT/ML ~~LOC~~ SOPN: 30.0000 [IU] | PEN_INJECTOR | Freq: Every day | SUBCUTANEOUS | 3 refills | Status: DC

## 2022-04-10 NOTE — Telephone Encounter (Signed)
Pt wife called for refills for basaglar do 30 units daily as per alyssa change and send pres to [facility]

## 2022-04-11 ENCOUNTER — Telehealth: Payer: Self-pay

## 2022-04-11 NOTE — Telephone Encounter (Signed)
Gave verbal order to phu from physical therapy  twice a week for  9 weeks

## 2022-04-13 ENCOUNTER — Other Ambulatory Visit: Payer: Self-pay | Admitting: Nurse Practitioner

## 2022-04-13 DIAGNOSIS — I872 Venous insufficiency (chronic) (peripheral): Secondary | ICD-10-CM

## 2022-04-18 ENCOUNTER — Encounter: Payer: Medicare PPO | Attending: Physician Assistant | Admitting: Physician Assistant

## 2022-04-18 DIAGNOSIS — L97822 Non-pressure chronic ulcer of other part of left lower leg with fat layer exposed: Secondary | ICD-10-CM | POA: Diagnosis not present

## 2022-04-18 DIAGNOSIS — E11622 Type 2 diabetes mellitus with other skin ulcer: Secondary | ICD-10-CM | POA: Insufficient documentation

## 2022-04-18 DIAGNOSIS — I87333 Chronic venous hypertension (idiopathic) with ulcer and inflammation of bilateral lower extremity: Secondary | ICD-10-CM | POA: Diagnosis not present

## 2022-04-18 DIAGNOSIS — L97812 Non-pressure chronic ulcer of other part of right lower leg with fat layer exposed: Secondary | ICD-10-CM | POA: Insufficient documentation

## 2022-04-18 DIAGNOSIS — I890 Lymphedema, not elsewhere classified: Secondary | ICD-10-CM | POA: Insufficient documentation

## 2022-04-21 NOTE — Progress Notes (Signed)
RAD, GRAMLING ([id number]) [id number] Page 1 of 10 Visit Report for 04/18/2022 Allergy List Details Patient Name: Date of Service: Adam Merritt [facility] W. 04/18/2022 8:45 A M Medical Record Number: [id number] Patient Account Number: [id number] Date of Birth/Sex: Treating RN: 02-Feb-1946 (76 y.o. Adam Merritt) Carlene Coria Primary Care Navea Woodrow: [name] Other Clinician: Referring Aevah Stansbery: Treating Zaeem Kandel/Extender: Jeri Cos Self, Referral [name]: 0 Allergies Active Allergies Corticosteroids (Glucocorticoids) Allergy Notes Electronic Signature(s) Signed: 04/21/2022 1:14:49 PM By: Carlene Coria RN Entered By: Carlene Coria on 04/18/2022 09:06:10 -------------------------------------------------------------------------------- Arrival Information Details Patient Name: Date of Service: [place], [place] W. 04/18/2022 8:45 A M Medical Record Number: [id number] Patient Account Number: [id number] Date of Birth/Sex: Treating RN: 1946-02-13 (76 y.o. Adam Merritt Primary Care Montrey Buist: [name] Other Clinician: Referring Chalise Pe: Treating Fischer Halley/Extender: Jeri Cos Self, Referral [name]: 0 Visit Information Patient Arrived: Wheel Chair Arrival Time: 08:36 Accompanied By: wife Transfer Assistance: None Patient Identification Verified: Yes Secondary Verification Process Completed: Yes Patient Requires Transmission-Based Precautions: No Patient Has Alerts: No ARAEL, PICCIONE ([id number]) History Since Last Visit All ordered tests and consults were completed: No Added or deleted any medications: No Any new allergies or adverse reactions: No Had a fall or experienced change in activities of daily living that may affect risk of falls: No Signs or symptoms of abuse/neglect since last visito No Hospitalized since last visit: No Implantable device outside of the clinic excluding cellular tissue based products placed in the center since last  visit: No Has Dressing in Place as Prescribed: Yes Has Compression in Place as Prescribed: Yes Pain Present Now: Yes Electronic Signature(s) Signed: 04/21/2022 1:14:49 PM By: Carlene Coria RN Entered By: Carlene Coria on 04/18/2022 08:44:49 -------------------------------------------------------------------------------- Clinic Level of Care Assessment Details Patient Name: Date of Service: [name]. 04/18/2022 8:45 A M Medical Record Number: [id number] Patient Account Number: [id number] Date of Birth/Sex: Treating RN: 1946/02/18 (76 y.o. Adam Merritt) Carlene Coria Primary Care Lannah Koike: [name] Other Clinician: Referring Wrigley Winborne: Treating Ivadell Gaul/Extender: Jeri Cos Self, Referral [name]: 0 Clinic Level of Care Assessment Items TOOL 1 Quantity Score X- 1 0 Use when EandM and Procedure is performed on INITIAL visit ASSESSMENTS - Nursing Assessment / Reassessment X- 1 20 General Physical Exam (combine w/ comprehensive assessment (listed just below) when performed on new pt. evals) X- 1 25 Comprehensive Assessment (HX, ROS, Risk Assessments, Wounds Hx, etc.) ASSESSMENTS - Wound and Skin Assessment / Reassessment '[]'$  - 0 Dermatologic / Skin Assessment (not related to wound area) ASSESSMENTS - Ostomy and/or Continence Assessment and Care '[]'$  - 0 Incontinence Assessment and Management '[]'$  - 0 Ostomy Care Assessment and Management (repouching, etc.) PROCESS - Coordination of Care X - Simple Patient / Family Education for ongoing care 1 15 '[]'$  - 0 Complex (extensive) Patient / Family Education for ongoing care '[]'$  - 0 Staff obtains Programmer, systems, Records, T Results / Process Orders est '[]'$  - 0 Staff telephones HHA, Nursing Homes / Clarify orders / etc '[]'$  - 0 Routine Transfer to another Facility (non-emergent condition) '[]'$  - 0 Routine Hospital Admission (non-emergent condition) X- 1 15 New Admissions / Biomedical engineer / Ordering NPWT Apligraf, etc. , '[]'$  -  0 Emergency Hospital Admission (emergent condition) PROCESS - Special Needs '[]'$  - 0 Pediatric / Minor Patient Management '[]'$  - 0 Isolation Patient Management '[]'$  - 0 Hearing / Language / Visual special needs '[]'$  - 0 Assessment of Community assistance (transportation, D/C planning, etc.) '[]'$  -  0 Additional assistance / Altered mentation '[]'$  - 0 Support Surface(s) Assessment (bed, cushion, seat, etc.) INTERVENTIONS - Miscellaneous '[]'$  - 0 External ear exam KAGE, WILLMANN ([id number]) [id number] Page 3 of 10 '[]'$  - 0 Patient Transfer (multiple staff / Civil Service fast streamer / Similar devices) '[]'$  - 0 Simple Staple / Suture removal (25 or less) '[]'$  - 0 Complex Staple / Suture removal (26 or more) '[]'$  - 0 Hypo/Hyperglycemic Management (do not check if billed separately) '[]'$  - 0 Ankle / Brachial Index (ABI) - do not check if billed separately Has the patient been seen at the hospital within the last three years: Yes Total Score: 75 Level Of Care: New/Established - Level 2 Electronic Signature(s) Signed: 04/21/2022 1:14:49 PM By: Carlene Coria RN Entered By: Carlene Coria on 04/18/2022 10:17:36 -------------------------------------------------------------------------------- Compression Therapy Details Patient Name: Date of Service: Adam Merritt W. 04/18/2022 8:45 A M Medical Record Number: [id number] Patient Account Number: [id number] Date of Birth/Sex: Treating RN: April 19, 1946 (76 y.o. Adam Merritt Primary Care Paulette Rockford: [name] Other Clinician: Referring Shacoria Latif: Treating Mozell Haber/Extender: Jeri Cos Self, Referral [name]: 0 Compression Therapy Performed for Wound Assessment: NonWound Condition Lymphedema - Right Leg Performed By: Clinician Carlene Coria, RN Compression Type: Four Layer Post Procedure Diagnosis Same as Pre-procedure Electronic Signature(s) Signed: 04/21/2022 1:14:49 PM By: Carlene Coria RN Entered By: Carlene Coria on 04/18/2022  10:16:43 -------------------------------------------------------------------------------- Compression Therapy Details Patient Name: Date of Service: Adam Merritt W. 04/18/2022 8:45 A M Medical Record Number: [id number] Patient Account Number: [id number] Date of Birth/Sex: Treating RN: [birth year] (76 y.o. Adam Merritt Primary Care Caylie Sandquist: [name] Other Clinician: Referring Dionisia Pacholski: Treating Khadijatou Borak/Extender: Jeri Cos Self, Referral [name]: 0 Compression Therapy Performed for Wound Assessment: NonWound Condition Lymphedema - Left Leg Performed By: Jake Church, RN Compression TypeTiajuana Amass Nunzio Cory ([id number]) [id number] Page 4 of 10 Post Procedure Diagnosis Same as Pre-procedure Electronic Signature(s) Signed: 04/21/2022 1:14:49 PM By: Carlene Coria RN Entered By: Carlene Coria on 04/18/2022 10:16:59 -------------------------------------------------------------------------------- Encounter Discharge Information Details Patient Name: Date of Service: [place] W. 04/18/2022 8:45 A M Medical Record Number: [id number] Patient Account Number: [id number] Date of Birth/Sex: Treating RN: 10/14/1945 (76 y.o. Adam Merritt Primary Care Lucita Montoya: [name] Other Clinician: Referring Lehman Whiteley: Treating Schawn Byas/Extender: Jeri Cos Self, Referral [name]: 0 Encounter Discharge Information Items Discharge Condition: Stable Ambulatory Status: Wheelchair Discharge Destination: Home Transportation: Private Auto Accompanied By: wife Schedule Follow-up Appointment: Yes Clinical Summary of Care: Electronic Signature(s) Signed: 04/21/2022 1:14:49 PM By: Carlene Coria RN Entered By: Carlene Coria on 04/18/2022 10:18:28 -------------------------------------------------------------------------------- Lower Extremity Assessment Details Patient Name: Date of Service: Adam Merritt [facility] W. 04/18/2022 8:45 A  M Medical Record Number: [id number] Patient Account Number: [id number] Date of Birth/Sex: Treating RN: 10-Dec-1945 (76 y.o. Adam Merritt Primary Care Diezel Mazur: [name] Other Clinician: Referring Addalie Calles: Treating Byard Carranza/Extender: Jeri Cos Self, Referral [name]: 0 Edema Assessment Assessed: [Left: No] [Right: No] Edema: [Left: Yes] [Right: Yes] Calf Left: Right: ILIAS, STCHARLES ([id number]) [id number] Page 5 of 10 Point of Measurement: 30 cm From Medial Instep 62 cm 54 cm Ankle Left: Right: Point of Measurement: 11 cm From Medial Instep 37 cm 32 cm Knee To Floor Left: Right: From Medial Instep 40 cm 40 cm Vascular Assessment Pulses: Dorsalis Pedis Palpable: [Left:Yes] [Right:Yes] Electronic Signature(s) Signed: 04/21/2022 1:14:49 PM By: Carlene Coria RN Entered By: Carlene Coria on 04/18/2022 09:06:05 --------------------------------------------------------------------------------  Multi Wound Chart Details Patient Name: Date of Service: [name]. 04/18/2022 8:45 A M Medical Record Number: [id number] Patient Account Number: [id number] Date of Birth/Sex: Treating RN: January 21, 1946 (76 y.o. Adam Merritt) Carlene Coria Primary Care Purity Irmen: [name] Other Clinician: Referring Chianne Byrns: Treating Renny Gunnarson/Extender: Jeri Cos Self, Referral [name]: 0 Vital Signs Height(in): 66 Pulse(bpm): 67 Weight(lbs): 304 Blood Pressure(mmHg): 162/90 Body Mass Index(BMI): 49.1 Temperature(F): 98.3 Respiratory Rate(breaths/min): 18 [Treatment Notes:Wound Assessments Treatment Notes] Electronic Signature(s) Signed: 04/18/2022 9:31:59 AM By: Carlene Coria RN Entered By: Carlene Coria on 04/18/2022 09:31:59 [name] ([id number]) [id number] Page 6 of 10 -------------------------------------------------------------------------------- Multi-Disciplinary Care Plan Details Patient Name: Date of  Service: Adam Merritt [facility] W. 04/18/2022 8:45 A M Medical Record Number: [id number] Patient Account Number: [id number] Date of Birth/Sex: Treating RN: Aug 02, 1945 (76 y.o. Adam Merritt) Carlene Coria Primary Care Yanni Quiroa: [name] Other Clinician: Referring Ted Leonhart: Treating Robertson Colclough/Extender: Jeri Cos Self, Referral [name]: 0 Active Inactive Abuse / Safety / Falls / Self Care Management Nursing Diagnoses: Potential for injury related to falls Goals: Patient will not experience any injury related to falls Date Initiated: 04/18/2022 Target Resolution Date: 05/18/2022 Goal Status: Active Interventions: Assess Activities of Daily Living upon admission and as needed Assess fall risk on admission and as needed Assess: immobility, friction, shearing, incontinence upon admission and as needed Assess impairment of mobility on admission and as needed per policy Assess personal safety and home safety (as indicated) on admission and as needed Assess self care needs on admission and as needed Notes: Wound/Skin Impairment Nursing Diagnoses: Knowledge deficit related to ulceration/compromised skin integrity Goals: Patient/caregiver will verbalize understanding of skin care regimen Date Initiated: 04/18/2022 Target Resolution Date: 05/18/2022 Goal Status: Active Ulcer/skin breakdown will have a volume reduction of 30% by week 4 Date Initiated: 04/18/2022 Target Resolution Date: 05/18/2022 Goal Status: Active Ulcer/skin breakdown will have a volume reduction of 50% by week 8 Date Initiated: 04/18/2022 Target Resolution Date: 06/18/2022 Goal Status: Active Ulcer/skin breakdown will have a volume reduction of 80% by week 12 Date Initiated: 04/18/2022 Target Resolution Date: 07/19/2022 Goal Status: Active Ulcer/skin breakdown will heal within 14 weeks Date Initiated: 04/18/2022 Target Resolution Date: 07/19/2022 Goal Status: Active Interventions: Assess patient/caregiver ability to  obtain necessary supplies Assess patient/caregiver ability to perform ulcer/skin care regimen upon admission and as needed Assess ulceration(s) every visit Notes: Electronic Signature(s) Signed: 04/18/2022 9:31:37 AM By: Carlene Coria RN Entered By: Carlene Coria on 04/18/2022 09:31:37 [name] ([id number]) [id number] Page 7 of 10 -------------------------------------------------------------------------------- Non-Wound Condition Assessment Details Patient Name: Date of Service: TA Theodore Demark. 11/14/2023andnbsp8:45 A M Medical Record Number: [id number] Patient Account Number: [id number] Date of Birth/Gender: Treating RN: 02/25/1946 (76 y.o. Adam Merritt) Carlene Coria Primary Care Physician: [name] Other Clinician: Referring Physician: Treating Physician/Extender: Jeri Cos Self, Referral [name]: 0 Non-Wound Condition: Condition: Lymphedema Location: Leg Side: Right Photos Electronic Signature(s) Signed: 04/21/2022 1:14:49 PM By: Carlene Coria RN Entered By: Carlene Coria on 04/18/2022 09:12:44 -------------------------------------------------------------------------------- Non-Wound Condition Assessment Details Patient Name: Date of Service: [name]. 11/14/2023andnbsp8:45 A M Medical Record Number: [id number] Patient Account Number: [id number] Date of Birth/Gender: Treating RN: 1945/08/11 (76 y.o. Adam Merritt Primary Care Physician: [name] Other Clinician: Referring Physician: Treating Physician/Extender: Jeri Cos Self, Referral [name]: 0 Non-Wound Condition: Condition: Lymphedema Location: Leg Side: Left Photos BERNARDINO, DOWELL ([id number]) [id number] Page 8 of 10 Electronic Signature(s) Signed: 04/21/2022 1:14:49 PM By:  Carlene Coria RN Entered By: Carlene Coria on 04/18/2022 09:12:45 -------------------------------------------------------------------------------- Pain  Assessment Details Patient Name: Date of Service: [name]. 04/18/2022 8:45 A M Medical Record Number: [id number] Patient Account Number: [id number] Date of Birth/Sex: Treating RN: 26-Jul-1945 (76 y.o. Adam Merritt) Carlene Coria Primary Care Holt Woolbright: [name] Other Clinician: Referring Jymir Dunaj: Treating Saquan Furtick/Extender: Jeri Cos Self, Referral [name]: 0 Active Problems Location of Pain Severity and Description of Pain Patient Has Paino Yes Site Locations With Dressing Change: Yes Duration of the Pain. Constant / Intermittento Intermittent How Long Does it Lasto Hours: 1 Minutes: Rate the pain. Current Pain Level: 4 Worst Pain Level: 5 Least Pain Level: 0 Tolerable Pain Level: 1 Character of Pain Describe the Pain: Burning Pain Management and Medication Current Pain Management: Medication: Yes Cold Application: No Rest: Yes Massage: No Activity: No T.E.N.S.: No Heat Application: No Leg drop or elevation: No Is the Current Pain Management Adequate: Inadequate How does your wound impact your activities of daily [address] CAEDYN, RAYGOZA ([id number]) [id number] Page 9 of 10 Sleep: Yes Bathing: No Appetite: No Relationship With Others: No Bladder Continence: No Emotions: No Bowel Continence: No Work: No Toileting: No Drive: No Dressing: No Hobbies: No Electronic Signature(s) Signed: 04/21/2022 1:14:49 PM By: Carlene Coria RN Entered By: Carlene Coria on 04/18/2022 08:49:00 -------------------------------------------------------------------------------- Patient/Caregiver Education Details Patient Name: Date of Service: [name]. 11/14/2023andnbsp8:45 A M Medical Record Number: [id number] Patient Account Number: [id number] Date of Birth/Gender: Treating RN: 11/27/1945 (76 y.o. Adam Merritt Primary Care Physician: [name] Other Clinician: Referring Physician: Treating Physician/Extender: Jeri Cos Self,  Referral [name]: 0 Education Assessment Education Provided To: Patient Education Topics Provided Wound/Skin Impairment: Methods: Explain/Verbal Responses: State content correctly Electronic Signature(s) Signed: 04/21/2022 1:14:49 PM By: Carlene Coria RN Entered By: Carlene Coria on 04/18/2022 10:17:55 -------------------------------------------------------------------------------- [place] Details Patient Name: Date of Service: [place], [place] W. 04/18/2022 8:45 A M Medical Record Number: [id number] Patient Account Number: [id number] Date of Birth/Sex: Treating RN: 1946-05-20 (76 y.o. Adam Merritt Primary Care Judit Awad: [name] Other Clinician: Referring Laquon Emel: Treating Carmellia Kreisler/Extender: Jeri Cos Self, Referral [name]: [address] MASIYAH, ENGEN ([id number]) [id number] Page 10 of 10 Vital Signs Time Taken: 08:49 Temperature (F): 98.3 Height (in): 66 Pulse (bpm): 67 Source: Stated Respiratory Rate (breaths/min): 18 Weight (lbs): 304 Blood Pressure (mmHg): 162/90 Source: Stated Reference Range: 80 - 120 mg / dl Body Mass Index (BMI): 49.1 Electronic Signature(s) Signed: 04/21/2022 1:14:49 PM By: Carlene Coria RN Entered By: Carlene Coria on 04/18/2022 08:49:41

## 2022-04-21 NOTE — Progress Notes (Signed)
MACKENZY, EISENBERG ([id number]) [phone] [email or web address] Page 1 of 5 Visit Report for 04/18/2022 Abuse Risk Screen Details Patient Name: Date of Service: Adam Merritt [facility] W. 04/18/2022 8:45 A M Medical Record Number: [id number] Patient Account Number: [id number] Date of Birth/Sex: Treating RN: Nov 15, 1945 (76 y.o. [name]) Carlene Coria Primary Care Barbra Miner: [name] Other Clinician: Referring Trevor Wilkie: Treating Quinnton Bury/Extender: Jeri Cos Self, Referral [name]: 0 Abuse Risk Screen Items Answer ABUSE RISK SCREEN: Has anyone close to you tried to hurt or harm you recentlyo No Do you feel uncomfortable with anyone in your familyo No Has anyone forced you do things that you didnt want to doo No Electronic Signature(s) Signed: 04/21/2022 1:14:49 PM By: Carlene Coria RN Entered By: Carlene Coria on 04/18/2022 08:50:26 -------------------------------------------------------------------------------- Activities of Daily Living Details Patient Name: Date of Service: Adam Landry. 04/18/2022 8:45 A M Medical Record Number: [id number] Patient Account Number: [id number] Date of Birth/Sex: Treating RN: [birth year] (76 y.o. [name] Primary Care Correne Lalani: [name] Other Clinician: Referring Deshay Kirstein: Treating Millee Denise/Extender: Jeri Cos Self, Referral [name]: 0 Activities of Daily Living Items Answer Activities of Daily Living (Please select one for each item) Drive Automobile Not Able T Medications ake Need Assistance Use T elephone Completely Able Care for Appearance Need Assistance Use T oilet Need Assistance Bath / Shower Need Assistance Dress Self Need Assistance Feed Self Completely Able Walk Not Able Get In / Out Bed Need Assistance Housework Not Able Adam Merritt, Adam Merritt ([id number]) [phone] [email or web address] Page 2 of 5 Prepare Meals Not Able Handle Money Need Assistance Shop for Self Not Able Electronic  Signature(s) Signed: 04/21/2022 1:14:49 PM By: Carlene Coria RN Entered By: Carlene Coria on 04/18/2022 08:53:37 -------------------------------------------------------------------------------- Education Screening Details Patient Name: Date of Service: Adam Merritt, Adam W. 04/18/2022 8:45 A M Medical Record Number: [id number] Patient Account Number: [id number] Date of Birth/Sex: Treating RN: 03/10/1946 (76 y.o. [name] Primary Care Kesi Perrow: [name] Other Clinician: Referring Atif Chapple: Treating Linkon Siverson/Extender: Jeri Cos Self, Referral [name]: 0 Primary Learner Assessed: Patient Learning Preferences/Education Level/Primary Language Learning Preference: Explanation Highest Education Level: High School Preferred Language: English Cognitive Barrier Language Barrier: No Translator Needed: No Memory Deficit: No Emotional Barrier: No Cultural/Religious Beliefs Affecting Medical Care: No Physical Barrier Impaired Vision: Yes Glasses Impaired Hearing: Yes Hearing Aid Decreased Hand dexterity: No Knowledge/Comprehension Knowledge Level: Medium Comprehension Level: Medium Ability to understand written instructions: Medium Ability to understand verbal instructions: Medium Motivation Anxiety Level: Anxious Cooperation: Cooperative Education Importance: Acknowledges Need Interest in Health Problems: Asks Questions Perception: Coherent Willingness to Engage in Self-Management High Activities: Readiness to Engage in Self-Management High Activities: Electronic Signature(s) Signed: 04/21/2022 1:14:49 PM By: Carlene Coria RN Entered By: Carlene Coria on 04/18/2022 08:55:24 Adam Merritt ([id number]) [id number] Page 3 of 5 -------------------------------------------------------------------------------- Fall Risk Assessment Details Patient Name: Date of Service: Adam Merritt [facility] W. 04/18/2022 8:45 A M Medical Record Number:  [id number] Patient Account Number: [id number] Date of Birth/Sex: Treating RN: Jan 03, 1946 (76 y.o. [name]) Carlene Coria Primary Care Cristal Howatt: [name] Other Clinician: Referring Helyne Genther: Treating Coree Riester/Extender: Jeri Cos Self, Referral [name]: 0 Fall Risk Assessment Items Have you had 2 or more falls in the last 12 monthso 0 Yes Have you had any fall that resulted in injury in the last 12 monthso 0 Yes FALLS RISK SCREEN History of falling - immediate or within 3 months 25 Yes Secondary diagnosis (Do you have 2  or more medical diagnoseso) 15 Yes Ambulatory aid None/bed rest/wheelchair/nurse 0 Yes Crutches/cane/walker 0 No Furniture 0 No Intravenous therapy Access/Saline/Heparin Lock 0 No Gait/Transferring Normal/ bed rest/ wheelchair 0 Yes Weak (short steps with or without shuffle, stooped but able to lift head while walking, may seek 0 No support from furniture) Impaired (short steps with shuffle, may have difficulty arising from chair, head down, impaired 0 No balance) Mental Status Oriented to own ability 0 No Electronic Signature(s) Signed: 04/21/2022 1:14:49 PM By: Carlene Coria RN Entered By: Carlene Coria on 04/18/2022 08:58:54 -------------------------------------------------------------------------------- Foot Assessment Details Patient Name: Date of Service: Adam Seller W. 04/18/2022 8:45 A M Medical Record Number: [id number] Patient Account Number: [id number] Date of Birth/Sex: Treating RN: 12-04-1945 (76 y.o. [name] Primary Care Erik Burkett: [name] Other Clinician: Referring Mina Babula: Treating Tracy Gerken/Extender: Jeri Cos Self, Referral [name]: 0 Foot Assessment Items Site Locations Adam Merritt, Adam Merritt ([id number]) [id number] Page 4 of 5 + = Sensation present, - = Sensation absent, C = Callus, U = Ulcer R = Redness, W = Warmth, M = Maceration, PU = Pre-ulcerative lesion F = Fissure, S =  Swelling, D = Dryness Assessment Right: Left: Other Deformity: No No Prior Foot Ulcer: No No Prior Amputation: No No Charcot Joint: No No Ambulatory Status: Non-ambulatory Assistance Device: Wheelchair Gait: Administrator, arts) Signed: 04/21/2022 1:14:49 PM By: Carlene Coria RN Entered By: Carlene Coria on 04/18/2022 09:03:45 -------------------------------------------------------------------------------- Nutrition Risk Screening Details Patient Name: Date of Service: Adam Merritt [facility] W. 04/18/2022 8:45 A M Medical Record Number: [id number] Patient Account Number: [id number] Date of Birth/Sex: Treating RN: [birth year] (76 y.o. [name]) Carlene Coria Primary Care Mackinley Cassaday: [name] Other Clinician: Referring Celester Morgan: Treating Loraine Bhullar/Extender: Jeri Cos Self, Referral [name]: 0 Height (in): 66 Weight (lbs): 304 Body Mass Index (BMI): 49.1 Nutrition Risk Screening Items Score Screening NUTRITION RISK SCREEN: I have an illness or condition that made me change the kind and/or amount of food I eat 0 No I eat fewer than two meals per day 0 No I eat few fruits and vegetables, or milk products 0 No I have three or more drinks of beer, liquor or wine almost every day 0 No I have tooth or mouth problems that make it hard for me to eat 0 No Adam Merritt, Adam Merritt ([id number]) [phone] [id number] Page 5 of 5 I don't always have enough money to buy the food I need 0 No I eat alone most of the time 0 No I take three or more different prescribed or over-the-counter drugs a day 1 Yes Without wanting to, I have lost or gained 10 pounds in the last six months 0 No I am not always physically able to shop, cook and/or feed myself 2 Yes Nutrition Protocols Good Risk Protocol Moderate Risk Protocol 0 Provide education on nutrition High Risk Proctocol Risk Level: Moderate Risk Score: 3 Electronic Signature(s) Signed: 04/21/2022 1:14:49 PM By: Carlene Coria  RN Entered By: Carlene Coria on 04/18/2022 08:59:31

## 2022-04-21 NOTE — Progress Notes (Signed)
DORY, VERDUN ([id number]) [id number] Page 1 of 18 Visit Report for 04/18/2022 Chief Complaint Document Details Patient Name: Date of Service: Adam Merritt [facility] W. 04/18/2022 8:45 A M Medical Record Number: [id number] Patient Account Number: [id number] Date of Birth/Sex: Treating RN: 1946/04/02 (76 y.o. [name]) Carlene Coria Primary Care Provider: [name] Other Clinician: Referring Provider: Treating Provider/Extender: Jeri Cos Self, Referral [name]: 0 Information Obtained from: Patient Chief Complaint Bilateral LE edema and weeping ulcers Electronic Signature(s) Signed: 04/18/2022 9:23:26 AM By: Worthy Keeler PA-C Entered By: Worthy Keeler on 04/18/2022 09:23:26 -------------------------------------------------------------------------------- HPI Details Patient Name: Date of Service: Adam Seller W. 04/18/2022 8:45 A M Medical Record Number: [id number] Patient Account Number: [id number] Date of Birth/Sex: Treating RN: April 18, 1946 (76 y.o. [name] Primary Care Provider: [name] Other Clinician: Referring Provider: Treating Provider/Extender: Jeri Cos Self, Referral [name]: 0 History of Present Illness HPI Description: 10/18/17-He is here for initial evaluation of bilateral lower extremity ulcerations in the presence of venous insufficiency and lymphedema. He has been seen by vascular medicine in the past, Dr. Lucky Cowboy, last seen in 2016. He does have a history of abnormal ABIs, which is to be expected given his lymphedema and venous insufficiency. According to Epic, it appears that all attempts for arterial evaluation and/or angiography were not follow through with by patient. He does have a history of being seen in lymphedema clinic in 2018, stopped going approximately 6 months ago stating "it didn't do any good". He does not have lymphedema pumps, he does not have custom fit compression wrap/stockings. He is diabetic  and his recent A1c last month was 7.6. He admits to chronic bilateral lower extremity pain, no change in pain since blister and ulceration development. He is currently being treated with Levaquin for bronchitis. He has home health and we will continue. 10/25/17-He is here in follow-up evaluation for bilateral lower extremity ulcerationssubtle he remains on Levaquin for bronchitis. Right lower extremity with no evidence of drainage or ulceration, persistent left lower extremity ulceration. He states that home health has not been out since his appointment. He went to [facility] on Tuesday, studies revealed: RIGHT ABI 0.9, TBI 0.6 LEFT ABI 1.1, TBI 0.6 with triphasic flow bilaterally. We will continue his same treatment plan. He has been educated on compression therapy and need for elevation. He will benefit from lymphedema pumps 11/01/17-He is here in follow-up evaluation for left lower extremity ulcer. The right lower extremity remains healed. He has home health services, but they have not been out to see the patient for 2-3 weeks. He states it home health physical therapy changed his dressing yesterday after therapy; he placed Ace wrap compression. We are still waiting for lymphedema pumps, reordered d/t need for company change. 11/08/17-He is here in follow-up evaluation for left lower extremity ulcer. It is improved. Edema is significantly improved with compression therapy. We will continue with same treatment plan and he will follow-up next week. No word regarding lymphedema pumps 11/15/17-He is here in follow-up evaluation for left lower extremity ulcer. He is healed and will be discharged from wound care services. I have reached out to [employer] [place] ([id number]) [id number] Page 2 of 18 [facility] regarding his lymphedema pumps. They have been unable to reach the patient; the contact number they had with the patient's wife's cell phone and she has not  answered any unrecognized calls. Contact should be made today, trial planned for next week;  [facility] will continue to follow 11/27/17 on evaluation today patient has multiple blistered areas over the right lower extremity his left lower extremity appears to be doing okay. These blistered areas show signs of no infection which is great news. With that being said he did have some necrotic skin overlying which was mechanically debrided away with saline and gauze today without complication. Overall post debridement the wounds appear to be doing better but in general his swelling seems to be increased. This is obviously not good news. I think this is what has given rise to the blisters. 12/04/17 on evaluation today patient presents for follow-up concerning his bilateral lower extremity edema in the right lower extremity ulcers. He has been tolerating the dressing changes without complication. With that being said he has had no real issues with the wraps which is also good news. Overall I'm pleased with the progress he's been making. 12/11/17 on evaluation today patient appears to be doing rather well in regard to his right lateral lower extremity ulcer. He's been tolerating the dressing changes without complication. Fortunately there does not appear to be any evidence of infection at this time. Overall I'm pleased with the progress that is being made. Unfortunately he has been in the hospital due to having what sounds to be a stomach virus/flu fortunately that is starting to get better. 12/18/17 on evaluation today patient actually appears to be doing very well in regard to his bilateral lower extremities the swelling is under fairly good control his lymphedema pumps are still not up and running quite yet. With that being said he does have several areas of opening noted as far as wounds are concerned mainly over the left lower extremity. With that being said I do believe once he gets lymphedema pumps this  would least help you mention some the fluid and preventing this from occurring. Hopefully that will be set up soon sleeves are Artie in place at his home he just waiting for the machine. 12/25/17 on evaluation today patient actually appears to be doing excellent in fact all of his ulcers appear to have resolved his legs appear very well. I do think he needs compression stockings we have discussed this and they are actually going to go to [place] today to elastic therapy to get this fitted for him. I think that is definitely a good thing to do. Readmission: 04/09/18 upon evaluation today this patient is seen for readmission due to bilateral lower extremity lymphedema. He has significant swelling of his extremities especially on the left although the right is also swollen he has weeping from both sides. There are no obvious open wounds at this point. Fortunately he has been doing fairly well for quite a bit of time since I last saw him. Nonetheless unfortunately this seems to have reopened and is giving quite a bit of trouble. He states this began about a week ago when he first called [place] to get in to be seen. No fevers, chills, nausea, or vomiting noted at this time. He has not been using his lymphedema pumps due to the fact that they won't fit on his leg at this point likewise is also not been using his compression for essentially the same reason. 04/16/18 upon evaluation today patient actually appears to be doing a little better in regard to the fluid in his bilateral lower extremities. With that being said he's had three falls since I saw him last week. He also states that he's been feeling very poorly. I  was concerned last week and feel like that the concern is still there as far as the congestion in his chest is concerned he seems to be breathing about the same as last week but again he states he's very weak he's not even able to walk further than from the chair to the door. His wife had to buy a  wheelchair just to be able to get them out of the house to get to the appointment today. This has me very concerned. 04/23/18 on evaluation today patient actually appears to be doing much better than last week's evaluation. At that time actually had to transport him to the ER via EMS and he subsequently was admitted for acute pulmonary edema, acute renal failure, and acute congestive heart failure. Fortunately he is doing much better. Apparently they did dialyze him and were able to take off roughly 35 pounds of fluid. Nonetheless he is feeling much better both in regard to his breathing and he's able to get around much better at this time compared to previous. Overall I'm very happy with how things are at this time. There does not appear to be any evidence of infection currently. No fevers, chills, nausea, or vomiting noted at this time. 04/30/2018 patient seen today for follow-up and management of bilateral lower extremity lymphedema. He did express being more sad today than usual due to the recent loss of his dog. He states that he has been compliant with using the lymphedema pumps. However he does admit a minute over the last 2-3 days he has not been using the pumps due to the recent loss of his dog. At this time there is no drainage or open wounds to his lower extremities. The left leg edema is measuring smaller today. Still has a significant amount of edema on bilateral lower extremities With dry flaky skin. He will be referred to the lymphedema clinic for further management. Will continue 3 layer compression wraps and follow-up in 1-2 weeks.Denies any pain, fever, chairs recently. No recent falls or injuries reported during this visit. 05/07/18 on evaluation today patient actually appears to be doing very well in regard to his lower extremities in general all things considered. With that being said he is having some pain in the legs just due to the amount of swelling. He does have an area where  he had a blister on the left lateral lower extremity this is open at this point other than that there's nothing else weeping at this time. 05/14/18 on evaluation today patient actually appears to be doing excellent all things considered in regard to his lower extremities. He still has a couple areas of weeping on each leg which has continued to be the issue for him. He does have an appointment with the lymphedema clinic although this isn't until February 2020. That was the earliest they had. In the meantime he has continued to tolerate the compression wraps without complication. 05/28/18 on evaluation today patient actually appears to be doing more poorly in regard to his left lower extremity where he has a wound open at this point. He also had a fall where he subsequently injured his right great toe which has led to an open wound at the site unfortunately. He has been tolerating the dressing changes without complication in general as far as the wraps are concerned that he has not been putting any dressing on the left 1st toe ulcer site. 06/11/18 on evaluation today patient appears to be doing much worse in regard to  his bilateral lower extremity ulcers. He has been tolerating the dressing changes without complication although his legs have not been wrapped more recently. Overall I am not very pleased with the way his legs appear. I do believe he needs to be back in compression wraps he still has not received his compression wraps from the [facility] hospital as of yet. 06/18/18 on evaluation today patient actually appears to be doing significantly better than last time I saw him. He has been tolerating the compression wraps without complication in the circumferential ulcers especially appear to be doing much better. His toe ulcer on the right in regard to the great toe is better although not as good as the legs in my pinion. No fevers chills noted 07/02/18 on evaluation today patient appears to be doing much  better in regard to his lower extremity ulcers. Unfortunately since I last saw him he's had the distal portion of his right great toe if he dated it sounds as if this actually went downhill very quickly. I had only seen him a few days prior and the toe did not appear to be infected at that point subsequently became infected very rapidly and it was decided by the surgeon that the distal portion of the toe needed to be removed. The patient seems to be doing well in this regard he tells me. With that being said his lower extremities are doing better from the standpoint of the wounds although he is significantly swollen at this point. 07/09/18 on evaluation today patient appears to be doing better in regard to the wounds on his lower extremities. In fact everything is almost completely healed he is just a small area on the left posterior lower extremity that is open at this point. 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 With that being said he needs some kind of compression it does sound like he may not be wearing his compression, that is the wraps, during the entire time between when he's here visit to visit. Apparently his wife took the current one off because it began to "fall apart". 07/16/18 on evaluation today patient appears to be extremely swollen especially in regard to his left lower extremity unfortunately. He also has a new skin tear over the left lower extremity and there's a smaller area on the right lower extremity as well. Unfortunately this seems to be due in part to blistering and fluid buildup in his leg. He did get the reduction wraps that were ordered by the [facility] hospital for him to go to lymphedema clinic. With that being said his wounds on the legs have not healed to the point to where they would likely  accept them as a patient lymphedema clinic currently. We need to try to get this to heal. With that being said he's been taking his wraps off which is not doing him any favors at this point. In fact this is probably quite counterproductive compared to what needs to occur. We will likely need to increase to a four layer compression wrap and continue to also utilize elevation and he has to keep the wraps on not take them off as he's been doing currently hasn't had a wrap on since Saturday. Adam Merritt, Adam Merritt ([id number]) [id number] Page 3 of 18 07/23/18 on evaluation today patient appears to be doing much better in  regard to his bilateral lower extremities. In fact his left lower extremity which was the largest is actually 15 cm smaller today compared to what it was last time he was here in our clinic. This is obviously good news after just one week. Nonetheless the differences he actually kept the wraps on during the entire week this time. That's not typical for him. I do believe he understands a little bit better now the severity of the situation and why it's important for him to keep these wraps on. 07/30/18 on evaluation today patient actually appears to be doing rather well in regard to his lower extremities. His legs are much smaller than they have been in the past and he actually has only one very small rudder superficial region remaining that is not closed on the left lateral/posterior lower extremity even this is almost completely close. I do believe likely next week he will be healed without any complications. I do think we need to continue the wraps however this seems to be beneficial for him. I also think it may be a good time for [place] to go ahead and see about getting the appointment with the lymphedema clinic which is supposed to be made for him in order to keep this moving along and hopefully get them into compression wraps that will in the end help him to  remain healed. 08/06/18 on evaluation today patient actually appears to be doing very well in regard as bilateral lower extremities. In fact his wounds appear to be completely healed at this time. 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 He is in the process of getting appointment with the lymphedema clinic we have made this referral were just waiting to hear back on the schedule time. We need to follow up on that today as well. 08/13/18 on evaluation today patient actually appears to be doing very well in regard to his bilateral lower extremities there are no open wounds at this point. We are gonna go ahead and see about ordering the Velcro compression wraps for him had a discussion with them about [place] doing it versus the [place] they feel like they can definitely afford going ahead and get the wraps themselves and they would prefer to try to avoid having to go to the lymphedema clinic if it all possible which I completely understand. As long as he has good compression I'm okay either way. 3/17//20 on evaluation today patient actually appears to be doing well in regard to his bilateral lower extremity ulcers. He has been tolerating the dressing changes without complication specifically the compression wraps. Overall is had no issues my fingers finding that I see at this point is that he is having trouble with constipation. He tells me he has not been able to go to the bathroom for about six days. He's taken to over-the-counter oral laxatives unfortunately this is not helping. He has not contacted his doctor. 08/27/18 on evaluation today patient appears to be doing fairly well in regard to his lower extremities at this point. There does not appear to be any new altars is swelling is very well controlled. We are still waiting for his Velcro compression wraps to arrive that should be sometime in the next week is Artie sent the check for  these. 09/03/18 on evaluation today patient appears to be doing excellent in regard to his bilateral lower extremities which he shows no signs of wound openings in regard to this point. He  does have his Velcro compression wraps which did arrive in the mail since I saw him last week. Overall he is doing excellent in my pinion. 09/13/18 on evaluation today patient appears to be doing very well currently in regard to the overall appearance of his bilateral lower extremities although he's a little bit more swollen than last time we saw him. At that point he been discharged without any open wounds. Nonetheless he has a small open wound on the posterior left lower extremity with some evidence of cellulitis noted as well. Fortunately I feel like he has made good progress overall with regard to his lower extremities from were things used to be. 09/20/18 on evaluation today patient actually appears to be doing much better. The erythematous lower extremity is improving wound itself which is still open appears to be doing much better as far as but appearance as well as pain is concerned overall very pleased in this regard. There's no signs of active infection at this time. 09/27/18 on evaluation today patient's wounds on the lower extremity actually appear to be doing fairly well at this time which is good news. There is no evidence of active infection currently and again is just as left lower extremity were there any wounds at this point anyway. I believe they may be completely healed but again I'm not 100% sure based on evaluation today. I think one more week of observation would likely be a good idea. 10/04/18 on evaluation today patient actually appears to be doing excellent in regard to the left lower extremity which actually appears to be completely healed as of today. Unfortunately he's been having issues with his right lower extremity have a new wound that has opened. Fortunately there's no evidence of  active infection at this time which is good news. 10/10/18 upon evaluation today patient actually appears to be doing a little bit worse with the new open area on his right posterior lower extremity. He's been tolerating the dressing changes without complication. Right now we been using his compression wraps although I think we may need to switch back to actually performing bilateral compression wraps are in the clinic. No fevers, chills, nausea, or vomiting noted at this time. 10/17/18 on evaluation today patient actually appears to be doing quite well in regard to his bilateral lower extremity ulcers. He has been tolerating the reps without complication although he would prefer not to rewrap his legs as of today. Fortunately there's no signs of active infection which is good news. No fevers, chills, nausea, or vomiting noted at this time. 10/24/18 on evaluation today patient appears to be doing very well in regard to his lower extremities. His right lower extremity is shown signs of healing and his left lower Trinity though not healed appears to be improving which is excellent news. Overall very pleased with how things seem to be progressing at this time. The patient is likewise happy to hear this. His Velcro compression wraps however have not been put on properly will gonna show his wife how to do that properly today. 10/31/18 on evaluation today patient appears to be doing more poorly in regard to his left lower extremity in particular although both lower extremities actually are showing some signs of being worse than my previous evaluation. Unfortunately I'm just not sure that his compression stockings even with the use of the compression/lymphedema pumps seem to be controlling this well. Upon further questioning he tells me that he also is not able to lie flat in  the bed due to his congestive heart failure and difficulty breathing. For that reason he sleeps in his recliner. T make matters worse his  recliner also cannot even hold his legs up o so instead of even being somewhat elevated they pretty much hang down to the floor. This is the way he sleeps each night which is definitely counterproductive to everything else that were attempting to do from the standpoint of controlling his fluid. Nonetheless I think that he potentially could benefit from a hospital bed although this would be something that his primary care provider would likely have to order since anything that is order on our side has to be directly related to wound care and again the hospital bed is not necessarily a direct relation to although I think it does contribute to his overall wound status on his lower extremities. 11/07/18 on evaluation today patient appears to be doing better in regard to his bilateral lower extremity. He's been tolerating the dressing changes without complication. We did in the interim since I last saw him switch to just using extras orbiting new alginate and that seems to have done much better for him. I'm very pleased with the overall progress that is made. 11/14/18 on evaluation today patient appears to be redoing rather well in regard to his left lower extremity ulcers which are the only ones remaining at this point. Fortunately there's no signs of active infection at this time which is good news. Overall been very pleased with how things seem to be progressing currently. No fevers, chills, nausea, or vomiting noted at this time. 11/20/17 on evaluation today patient actually appears to be doing quite well in regard to his lower extremities on the left on the right he has several blisters that showed up although there's some question about whether or not he has had his broker compression wraps on like he was supposed to or not. Fortunately there's no signs of active infection at this time which is good news. Unfortunately though he is doing well from the standpoint of the left leg the right leg is  not doing as well again this is when we did not wrap last week. 11/28/18 on evaluation today patient appears to be doing well in regard to his bilateral lower extremities is left appears to be healed is right is not healed but is very close to being so. Overall very pleased with how things seem to be progressing. Patient is likewise happy that things are doing well. Adam Merritt, Adam Merritt ([id number]) [id number] Page 4 of 18 12/09/18 on evaluation today patient actually appears to be completely healed which is excellent news. He actually seems to be doing well in regard to the swelling of the bilateral lower extremities which is also great news. Overall very pleased with how things seem to be progressing. Readmission: 03/18/2019 upon evaluation today patient presents for reevaluation concerning issues that he is having with his left lower extremity where he does have a wound noted upon inspection today. He also has a wound on the right second toe on the tip where it is apparently been rubbing in his shoe I did look at issues and you can actually see where this has been occurring as well. Fortunately there is no signs of infection with regard to the toe necessarily although it is very swollen compared to normal that does have me somewhat concerned about the possibility of further evaluation for infection/osteomyelitis. I would recommend an x-ray to start with. Otherwise he states that it  is been 1-2 weeks that he has had the draining in regard to left lower extremity he does not know how this happened he has been wearing his compression appropriately and I do see that as well today but nonetheless I do think that he needs to continue to be very cautious with regard to elevation as well. 03/25/2019 on evaluation today patient appears to be doing much better in regard to his lower extremity at this time. That is on the left. He did culture positive for Pseudomonas but the good news is  he seems to be doing much better the gentamicin we applied topically seems to have done a great job for him. There is no signs of active infection at this time systemically and locally he is doing much better. No fevers, chills, nausea, vomiting, or diarrhea. In regard to his right toe this seems to be doing much better there is very little pressure noted at this point I did clean up some of the callus today around the edges of the wound as well as the surface of the wound but to be honest he is progressing quite nicely. 10/30; the area on the left anterior lower tibia area is healed over. His edema control is adequate even though his use of the compression pumps seems very intermittent. He has a juxta lite stocking on the right leg and a think there is 1 for the left leg at home. His wife is putting these on. He has an area on the plantar right second toe which is a hammertoe they are trying to offload this 04/08/2019 on evaluation today patient appears to be doing well in regard to his lower extremities which is good news. We are seeing him today for his toe ulcer which is giving him a little bit more trouble but again still seems to be doing better at this point which is good news. He is going require some sharp debridement in regard to the toe today however. 04/15/2019 on evaluation today patient actually appears to be doing quite well with regard to his toe ulcer. I am very pleased with how things are doing in that regard. With regard to his lower extremity edema in general he tells me he is not been using the lymphedema pumps regularly although he does not use them. I think that he needs to use them more regularly he does have a small blister on the left lower extremity this is not open at this point but obviously it something that can get worse if he does not keep the compression going and the pumps going as well. 04/22/2019 on evaluation today patient appears to be doing well with regard to his  toe ulcer. He has been tolerating the dressing changes without complication. This is measuring slightly smaller this week as compared to last week. Fortunately there is no signs of active infection at this time. 05/06/2019 on evaluation today patient actually appears to be doing excellent. In fact his toe appears to be completely healed which is great news. There is no signs of active infection at this time. Readmission: Patient presents today for follow-up after having been discharged at the beginning of December 2020. He states that in recent weeks things have reopened and has been having more trouble at this point. With that being said fortunately there is no signs of active infection at this time. No fever chills noted. Nonetheless he also does have an issue with one of his toes as well that seems to be  somewhat this is on the right foot second toe. 07/25/2019 upon evaluation today patient's lower extremities appear to be doing excellent bilaterally. I really feel like he is showing signs of great improvement and in fact I think he is close to healing which is great news. There is no signs of active infection at this time. No fevers, chills, nausea, vomiting, or diarrhea. 07/31/2019 upon evaluation today patient appears to be doing excellent and in fact I think may be completely healed in regard to his right lower extremity that were still to monitor this 1 more week before closing it out. The left lower extremity is measuring smaller although not completely closed seems to be doing excellent. 08/07/2019 upon evaluation today patient appears to be doing excellent in regard to his wounds. In fact the right lower extremity is completely healed there is no issues here. The left lower extremity has 1 very small area still remaining fortunately there is no signs of infection and overall I think he is very close to closure of the here as well. 08/14/19 upon evaluation today patient appears to be doing  excellent in regard to his lower extremities. In fact he appears to be completely healed as of today. Fortunately there's no signs of active infection at this time. No fevers, chills, nausea, or vomiting noted at this time. READMISSION 09/26/2019 This is a 76 year old man who is well-known to this clinic having just been discharged on 08/14/2019. He has known lymphedema and chronic venous insufficiency. He has Farrow wrap stockings and also external compression pumps. He does not use the external compression pumps however. He does however apparently fairly faithfully use the stockings. They have not been lotion in his legs. He has developed new wounds on the right anterior as well as the left medial left lateral and left posterior calf. He returns for our review of this Past medical history includes coronary artery disease, hypertension, congestive heart failure, COPD, venous insufficiency with lymphedema, type 2 diabetes. He apparently has compression pumps, Farrow wraps and at 1 point a hospital bed although I believe he sleeps in a recliner 10/03/2019 upon evaluation today patient appears to be doing better with regard to his wounds in general. He has been tolerating the dressing changes without complication. Fortunately there is no signs of active infection. No fevers, chills, nausea, vomiting, or diarrhea. I do believe the compression wraps are helping as they always have in the past. 10/10/2019 upon evaluation today patient appears to be doing excellent at this point. His right lower extremity is measuring much better and in fact is completely healed. His left lower extremity is also measuring better though not healed seems to be doing excellent at this point which is great news. Overall I am very pleased with how things appear currently. 10/27/2019 upon evaluation today patient appears to be doing quite well with regard to his wounds currently. He has been tolerating the dressing changes  without complication. Fortunately there is no signs of active infection at this time. In fact he is almost completely healed on the left and has just a very small area open and on the right he is completely healed. 11/04/2019 upon evaluation today patient appears to be doing quite well with regard to his wounds. In fact he appears to be quite possibly completely healed on the left lower extremity now as well the right lower extremity is still doing well. With that being said unfortunately though this may be healed I think it is a very fragile  area and I cannot even confirm there is not a very small opening still remaining. I really think he may benefit from 1 additional weeks wrapping before discontinuing. 11/10/2019 upon evaluation today patient appears to be doing well in regard to his original wounds in fact everything that we were treating last week is completely healed on the left. Unfortunately he has a new skin tear just above where the wrap slipped down due to a fall he sustained causing this injury. 11/17/2019 on evaluation today patient appears to be doing better with regard to his wound. He has been tolerating the dressing changes without complication. Fortunately there is no signs of active infection at this time. No fevers, chills, nausea, vomiting, or diarrhea. Adam Merritt, Adam Merritt ([id number]) [id number] Page 5 of 18 11/24/2019 upon evaluation today patient appears to be doing well with regard to his wound. This is making good progress there does not appear to be signs of active infection but overall I do feel like he is headed in the correct direction. Overall he is tolerating the compression wrap as well. 12/02/2019 upon evaluation today patient appears to be doing well with regard to his leg ulcer. In fact this appears to be completely healed and this is the last of the wounds that he had but still the left anterior lower extremity. Fortunately there is no signs of  active infection at this time. No fevers, chills, nausea, vomiting, or diarrhea. Readmission: 02/05/2020 on evaluation today patient appears to be doing well with regard to his wound all things considered. He actually tells me that a couple weeks ago he had trouble with his wraps and therefore took him off for a couple of days in order to give his legs a break. It was during this time that he actually developed increased swelling and edema of his legs and blisters on both legs. That is when he called to make the appointment. Nonetheless since that time he began wearing his compression wraps which are Velcro in the meantime and has done extremely well with this. In fact his leg appears to be under good control as far as edema is concerned today it is nothing like what I am seeing when he has come in for evaluations in the past. They have been using some of the silver alginate at home which has been beneficial for him. Fortunately there is no signs of active infection at this time. No fevers, chills, nausea, vomiting, or diarrhea. 02/19/2020 on evaluation today patient unfortunately does not appear to be doing quite as well as I would like to see. He has no signs of active infection at this time but unfortunately he is not doing well in regard to the overall appearance of his left leg. He has a couple other areas that are weeping now and the leg is much more swollen. I think that he needs to actually have a compression wrap to try to manage this. 02/26/2020 on evaluation today patient appears to be doing well with regard to his left lower extremity ulcer. Fortunately the swelling is down I do believe the pressure wrap seems to be doing quite well. Fortunately there is no signs of active infection at this time. 03/05/2020 upon evaluation today patient appears to be doing excellent in regard to his legs at this point. Fortunately there is no signs of active infection which is great news. Overall he feels like  he is completely healed which is great news. Readmission: 05/20/2020 upon evaluation today patient appears to be  doing well with regard to his leg ulcer all things considered. He is actually coming back to see [place] after having had a reopening of the ulcers on his left leg in the past several weeks. He is out of dressing supplies and his wife is been try to take care of this as best she can. 06/10/2020 upon evaluation today patient unfortunately appears to be doing significantly worse today compared to when I last saw him. He and his wife both tell me that "there has been a lot going on". I did not actually go into detail about everything that has been happening but nonetheless he has not obviously been wearing his compression. He brings them with him today but they were not on. I am not even certain to be honest that he could get those on at this point. Nonetheless I do believe that he is going to require compression wrapping at some point shortly but right now we need to try to get what appears to be infection under control at this time. 06/28/2020 upon evaluation today patient appears to be doing well with regard to 06/28/2020 upon evaluation today patient appears to be doing well pretty well in regard to his left leg which is much better than previous. With that being said in regard to his right leg he does seem to be having some issues with a new wound after he sustained a fall. This fortunately is not too deep but does appear to be something we need to address. Neither deep which is good. 07/05/2020 on evaluation today patient appears to be doing well with regard to his wounds. In fact everything on the left leg appears to be pretty much healed on the right leg this is very close though not completely closed yet. Fortunately there is no evidence of active infection at this time. No fevers, chills, nausea, vomiting, or diarrhea. 07/13/2019 upon evaluation today patient appears to be making good progress  which is great news. Overall I am extremely pleased with where things stand today. There does not appear to be any signs of active infection which is great news and overall his left leg appears healed still the right leg though not healed does seem to be making good progress which is excellent news. He is having no pain. 07/19/2020 on evaluation today patient appears to be doing well with regard to his wound. He has been tolerating the dressing changes without complication. Fortunately there does not appear to be any signs of active infection at this time. No fevers, chills, nausea, vomiting, or diarrhea. Readmission: 08/20/2020 on evaluation today patient presents for reevaluation here in the clinic concerning issues that he has been having with his bilateral lower extremities. Unfortunately this is an ongoing reoccurring issue. He tells me that he is really not certain exactly what caused the issue although as I discussed this further with him it appears that he has been sitting for the most part and sleeping in his chair with his feet on the ground he is not really elevating at all in fact the chair does not even have a reclining function therefore it is basically just him sitting with his feet on the ground. I think this alone has probably led to his increased swelling he is also having more difficulty walking which means that he is pumping less blood flow through his legs anyway as far as the venous stasis is concerned. All in all I think this is leading to a downward spiral of him not  being able to walk very well as his legs are so heavy, they begin to weep, and overall he just does not seem to be doing as well as it was when I last saw him. 08/27/2020 upon evaluation today patient's legs actually are showing signs of improvement which is great news. There does not appear to be any evidence of infection which is also excellent news. I am extremely pleased with where things stand today. No fevers,  chills, nausea, vomiting, or diarrhea. 09/03/20 upon evaluation today patient appears to be doing excellent in regard to his wounds currently. He has been tolerating the dressing changes without complication and overall I am extremely pleased with where things stand today. No fevers, chills, nausea, vomiting, or diarrhea. Overall he is healed on the right leg although the left leg not being completely healed still is doing significantly better. 09/10/2020/8/22 upon evaluation today patient appears to be doing well with regard to his wounds. He has been tolerating the dressing changes without complication. Fortunately he appears to be completely healed. I am very pleased in that regard. As is the patient his right leg is also doing well he did get a new recliner which is helping him keep his feet off the ground which I think is helped he is down 2 cm on the left compared to last week so I think there is some definite improvement here. Readmission 12/15/20: Mr. Kelvyn Schunk is a 76 year old male with a past medical history of lymphedema, insulin-dependent type 2 diabetes, COPD and congestive heart failure that presents to the clinic for increased drainage from his legs bilaterally, Left greater than right. Patient has lymphedema. He has been seen in our clinic on multiple occasions for this issue. He currently denies signs of infection. He has not been using his lymphedema pumps. 12/23/2020 upon evaluation today patient appears to be doing well with regard to his wounds. He has been tolerating the dressing changes he really does not have any open wounds just weeping areas of the legs bilaterally and fortunately seems to be doing better at this point. 12/28/2020 upon evaluation today patient's wound is actually showing signs of good improvement. There does not appear to be any evidence of infection which is great news and overall I am extremely pleased at this point. I think is very close to complete  resolution. 01/04/2021 upon evaluation today patient actually appears to be completely healed which is great news. I do not see any signs of active infection currently which is also great news. In general I am extremely pleased with where we stand and I do believe that the patient is tolerating the dressing changes without complication. I believe he is ready to go back into his Velcro compression and I did see that they brought them with him today. JAXAN, MICHEL ([id number]) [id number] Page 6 of 18 Readmission: Patient presents today for follow-up he was last seen August 2. He is having some issues with some mild drainage and leaking from the left leg. This is something that has reopened and again is a recurrent issue for him. Subsequently he does wear his Velcro compression wraps though I think we may want to look into something a little bit better for him I am thinking that we may want to try the juxta fit compression which I think would do much more effective compression treatment overall for him. 04/08/2021 upon evaluation today patient appears to be doing well with regard to his legs. I do feel like he  is showing signs of improvement which is great news. No fevers, chills, nausea, vomiting, or diarrhea. 04/15/2021 upon evaluation today patient's wounds actually appear to be very close to complete resolution. He is legs are actually doing well left leg just has a small area of leaking at this point. Overall I think that we are very close to complete resolution based on what I am seeing. Nonetheless he seems to not be feeling too well today I really do not know what exactly is going on here. 04/22/2021 upon evaluation patient appears to be completely healed based on what I see today. I do not see anything open both legs appear to be doing quite well. 05/20/2021 upon evaluation today patient appears to be doing poorly in regard to his legs. He has not been a full month  since we last saw him and he has reopened unfortunately. He does not appear to be any signs of active infection at this time which is great news. No fevers, chills, nausea, vomiting, or diarrhea. 12/27; the patient comes in the clinic today with severe bilateral lymphedema very dry skin but paradoxically his wounds are actually healed or should I say not open. He has compression pumps that he does not use. [Once per week] he does not have compression stockings. He comes in today with a small presumably pressure ulcer on the lateral plantar heel on the right. The patient states he has been complaining about pain in this area for about 2 months although no wound was recognized until our intake nurse was doing her exam 06/16/2021 upon evaluation today patient appears to be doing better in regard to his wounds. He is not completely healed yet especially in regard to the right heel which is still open. Nonetheless I do think that his left leg is almost completely healed the right leg appears to be doing decently well his right heel is what still mainly open currently. 06/30/2021 upon evaluation today patient appears to be doing well with regard to his wound. Has been tolerating the dressing changes without complication. Fortunately I think his legs are doing quite well the heel is also doing better. In general I am very pleased with where we stand today. 07/14/2021 upon evaluation today patient appears to be doing well with regard to his heel ulcer. Fortunately I think that it is showing some signs of improvement unfortunately there is a little bit of skin overlying the wound surface which is actually causing this to breakdown a little bit to be honest. It looks a little macerated. Nonetheless we need to clean this away. 07/28/2021 upon evaluation today patient appears to be doing well currently in regard to his heel ulcer which is almost completely closed and this is great news. Lets about the end of where  the great news stands. In regard to his legs unfortunately both legs are weeping at this point there really are not any true open wounds. Nonetheless I do believe that working half the compression wrap him in order to get this under control. I discussed that with the patient and his wife today. 08/04/2021 upon evaluation today patient appears to be doing well currently in regard to his legs. Overall things are doing quite nicely. I do not see any evidence of active infection locally or systemically at this time which is great news and overall I am extremely pleased with where we stand today. No fevers, chills, nausea, vomiting, or diarrhea. 3/9; patient presents for follow-up. He has been using his lymphedema  pumps twice daily with improvement in lower extremity swelling. He has no issues or complaints today. He denies signs of infection. 08/18/2021 upon evaluation patient appears to be doing excellent at this point today. In fact he really does not have any open wounds whatsoever which is great news. Honestly I do think that the patient is tolerating the compression wraps quite nicely and his legs are significantly smaller he is also using his lymphedema pumps and he is able to lift his legs. He is walking better and getting up and down better everything about what is going on at this point seems to be doing excellent and very pleased with how the patient is doing currently. 08/25/2021 upon evaluation today patient appears to be doing very well in regard to his wound. In fact everything on his heel as well as his legs are showing signs of good improvement. Everything is closed up at this point. Readmission: 04-18-2022 upon evaluation today patient appears to be doing poorly in regard to his bilateral lower extremities. Has been tolerating the dressing changes with his wife at home she has been putting on what she could do to try to help protect the area she tells me a lot of times she has been using  lotion as well as the Farrow 4000 wraps which to be honest are really not the best thing for him. They do not fit his legs fully and do not get help keep it compressed appropriately. Fortunately there does not appear to be any signs of infection at this time which is good news. Nonetheless I do believe we need to get his swelling under control. Electronic Signature(s) Signed: 04/18/2022 10:06:41 AM By: Worthy Keeler PA-C Entered By: Worthy Keeler on 04/18/2022 10:06:41 -------------------------------------------------------------------------------- Physical Exam Details Patient Name: Date of Service: Adam Seller W. 04/18/2022 8:45 A M Medical Record Number: [id number] Patient Account Number: [id number] CALAHAN, PAK ([id number]) [id number] Page 7 of 18 Date of Birth/Sex: Treating RN: 03/09/1946 (76 y.o. [name]) Carlene Coria Primary Care Provider: Other Clinician: [name] Referring Provider: Treating Provider/Extender: Jeri Cos Self, Referral [name]: 0 Constitutional Well-nourished and well-hydrated in no acute distress. Respiratory normal breathing without difficulty. Psychiatric this patient is able to make decisions and demonstrates good insight into disease process. Alert and Oriented x 3. pleasant and cooperative. Notes Upon inspection patient's wound bed actually showed signs of good granulation and epithelization at this point. Fortunately there does not appear to be any evidence of active infection locally or systemically which is great news and overall I am extremely pleased with where we stand currently. I do think he has a lot of weeping areas are not any specific wounds open at this point. Electronic Signature(s) Signed: 04/18/2022 10:07:10 AM By: Worthy Keeler PA-C Entered By: Worthy Keeler on 04/18/2022 10:07:10 -------------------------------------------------------------------------------- Physician Orders  Details Patient Name: Date of Service: Adam Seller W. 04/18/2022 8:45 A M Medical Record Number: [id number] Patient Account Number: [id number] Date of Birth/Sex: Treating RN: 1945/08/12 (76 y.o. 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 [place] for wound care. May utilize formulary equivalent dressing for wound treatment orders unless otherwise specified. Home Health Nurse may visit PRN to address patients wound care needs. Marta Lamas [phone] Bathing/ Shower/ Hygiene May shower; gently cleanse wound with antibacterial soap, rinse and pat dry prior to dressing wounds Edema Control - Lymphedema / Segmental Compressive Device / Other Optional: One layer of unna paste to top of compression wrap (to act as an anchor). 4 Layer Compression System Lymphedema. Elevate, Exercise Daily and A void Standing for Long Periods of Time. Elevate legs to the level of the heart and pump ankles as often as possible Elevate leg(s) parallel to the floor when sitting. Compression Pump: Use compression pump on left lower extremity for 60 minutes, twice daily. Compression Pump: Use compression pump on right lower extremity for 60 minutes, twice daily. GIOVANNI, BIBY ([id number]) [id number] Page 8 of 18 Electronic Signature(s) Signed: 04/19/2022 2:43:36 PM By: Worthy Keeler PA-C Signed: 04/21/2022 1:14:49 PM By: Carlene Coria RN Previous Signature: 04/18/2022 9:35:40 AM Version By: Carlene Coria RN Entered By: Carlene Coria on 04/18/2022 09:44:01 -------------------------------------------------------------------------------- Problem List Details Patient Name: Date of Service: [place], [place] W. 04/18/2022 8:45 A M Medical Record Number: [id number] Patient Account Number: [id number] Date of Birth/Sex: Treating RN: 02-16-1946 (76 y.o. [name] Primary Care Provider: [name] Other Clinician: Referring Provider: Treating Provider/Extender: Jeri Cos Self, Referral [name]: 0 Active Problems ICD-10 Encounter Code Description Active Date MDM Diagnosis I89.0 Lymphedema, not elsewhere classified 04/18/2022 No Yes I87.333 Chronic venous hypertension (idiopathic) with ulcer and inflammation of 04/18/2022 No Yes bilateral lower extremity L97.822 Non-pressure chronic ulcer of other part of left lower leg with fat layer exposed11/14/2023 No Yes L97.812 Non-pressure chronic ulcer of other part of right lower leg with fat layer 04/18/2022 No Yes exposed Inactive Problems Resolved Problems Electronic Signature(s) Signed: 04/18/2022 9:22:28 AM By: Worthy Keeler PA-C Entered By: Worthy Keeler on 04/18/2022 09:22:28 Adam Merritt, Adam Merritt ([id number]) [id number] Page 9 of 18 -------------------------------------------------------------------------------- Progress Note Details Patient Name: Date of Service: [name]. 04/18/2022 8:45 A M Medical Record Number: [id number] Patient Account Number: [id number] Date of Birth/Sex: Treating RN: 1945/10/04 (76 y.o. [name]) Carlene Coria Primary Care Provider: [name] Other Clinician: Referring Provider: Treating Provider/Extender: Jeri Cos Self, Referral [name]: 0 Subjective Chief Complaint Information obtained from Patient Bilateral LE edema and  weeping ulcers History of Present Illness (HPI) 10/18/17-He is here for initial evaluation of bilateral lower extremity ulcerations in the presence of venous insufficiency and lymphedema. He has been seen by vascular medicine in the past, Dr. Lucky Cowboy, last seen in 2016. He does have a history of abnormal ABIs, which is to be expected given his lymphedema and venous insufficiency. According to Epic, it appears that all attempts for arterial evaluation and/or angiography were not follow through with by patient. He does have a history of being seen in lymphedema clinic in 2018, stopped going approximately 6 months ago stating "it didn't do any good". He does not have lymphedema pumps, he does not have custom fit compression wrap/stockings. He is diabetic and his recent A1c last month was 7.6. He admits to chronic bilateral lower extremity pain, no change in pain since blister and ulceration development. He is currently being treated with Levaquin for bronchitis. He has home health and we will continue. 10/25/17-He is  here in follow-up evaluation for bilateral lower extremity ulcerationssubtle he remains on Levaquin for bronchitis. Right lower extremity with no evidence of drainage or ulceration, persistent left lower extremity ulceration. He states that home health has not been out since his appointment. He went to [facility] on Tuesday, studies revealed: RIGHT ABI 0.9, TBI 0.6 LEFT ABI 1.1, TBI 0.6 with triphasic flow bilaterally. We will continue his same treatment plan. He has been educated on compression therapy and need for elevation. He will benefit from lymphedema pumps 11/01/17-He is here in follow-up evaluation for left lower extremity ulcer. The right lower extremity remains healed. He has home health services, but they have not been out to see the patient for 2-3 weeks. He states it home health physical therapy changed his dressing yesterday after therapy; he placed Ace  wrap compression. We are still waiting for lymphedema pumps, reordered d/t need for company change. 11/08/17-He is here in follow-up evaluation for left lower extremity ulcer. It is improved. Edema is significantly improved with compression therapy. We will continue with same treatment plan and he will follow-up next week. No word regarding lymphedema pumps 11/15/17-He is here in follow-up evaluation for left lower extremity ulcer. He is healed and will be discharged from wound care services. I have reached out to [facility] regarding his lymphedema pumps. They have been unable to reach the patient; the contact number they had with the patient's wife's cell phone and she has not answered any unrecognized calls. Contact should be made today, trial planned for next week; [facility] will continue to follow 11/27/17 on evaluation today patient has multiple blistered areas over the right lower extremity his left lower extremity appears to be doing okay. These blistered areas show signs of no infection which is great news. With that being said he did have some necrotic skin overlying which was mechanically debrided away with saline and gauze today without complication. Overall post debridement the wounds appear to be doing better but in general his swelling seems to be increased. This is obviously not good news. I think this is what has given rise to the blisters. 12/04/17 on evaluation today patient presents for follow-up concerning his bilateral lower extremity edema in the right lower extremity ulcers. He has been tolerating the dressing changes without complication. With that being said he has had no real issues with the wraps which is also good news. Overall I'm pleased with the progress he's been making. 12/11/17 on evaluation today patient appears to be doing rather well in regard to his right lateral lower extremity ulcer. He's been tolerating the dressing changes without complication.  Fortunately there does not appear to be any evidence of infection at this time. Overall I'm pleased with the progress that is being made. Unfortunately he has been in the hospital due to having what sounds to be a stomach virus/flu fortunately that is starting to get better. 12/18/17 on evaluation today patient actually appears to be doing very well in regard to his bilateral lower extremities the swelling is under fairly good control his lymphedema pumps are still not up and running quite yet. With that being said he does have several areas of opening noted as far as wounds are concerned mainly over the left lower extremity. With that being said I do believe once he gets lymphedema pumps this would least help you mention some the fluid and preventing this from occurring. Hopefully that will be set up soon sleeves are Artie in place at  his home he just waiting for the machine. 12/25/17 on evaluation today patient actually appears to be doing excellent in fact all of his ulcers appear to have resolved his legs appear very well. I do think he needs compression stockings we have discussed this and they are actually going to go to [facility] today to elastic therapy to get this fitted for him. I think that is definitely a good thing to do. Readmission: 04/09/18 upon evaluation today this patient is seen for readmission due to bilateral lower extremity lymphedema. He has significant swelling of his extremities especially on the left although the right is also swollen he has weeping from both sides. There are no obvious open wounds at this point. Fortunately he has been doing fairly well for quite a bit of time since I last saw him. Nonetheless unfortunately this seems to have reopened and is giving quite a bit of trouble. He states this began about a week ago when he first called [place] to get in to be seen. No fevers, chills, nausea, or vomiting noted at this time. He has not been using his lymphedema pumps due to  the fact that they won't fit on his leg at this point likewise is also not been using his compression for essentially the same reason. 04/16/18 upon evaluation today patient actually appears to be doing a little better in regard to the fluid in his bilateral lower extremities. With that being said he's had three falls since I saw him last week. He also states that he's been feeling very poorly. I was concerned last week and feel like that the concern is still there as far as the congestion in his chest is concerned he seems to be breathing about the same as last week but again he states he's very weak he's not even able to walk further than from the chair to the door. His wife had to buy a wheelchair just to be able to get them out of the house to get to the appointment today. This has me very concerned. 04/23/18 on evaluation today patient actually appears to be doing much better than last week's evaluation. At that time actually had to transport him to the ER via EMS and he subsequently was admitted for acute pulmonary edema, acute renal failure, and acute congestive heart failure. Fortunately he is doing much better. Apparently they did dialyze him and were able to take off roughly 35 pounds of fluid. Nonetheless he is feeling much better both in regard to his breathing and he's able to get around much better at this time compared to previous. Overall I'm very happy with how things are at this time. There does not appear to be any evidence of infection currently. No fevers, chills, nausea, or vomiting noted at this time. 04/30/2018 patient seen today for follow-up and management of bilateral lower extremity lymphedema. He did express being more sad today than usual due to the recent loss of his dog. He states that he has been compliant with using the lymphedema pumps. However he does admit a minute over the last 2-3 days he has not been using the pumps due to the recent loss of his dog. At this time  there is no drainage or open wounds to his lower extremities. The left leg edema is measuring smaller today. Still has a significant amount of edema on bilateral lower extremities With dry flaky skin. He will be referred to the lymphedema clinic for further management. Will continue 3 layer compression  wraps and follow-up in 1-2 weeks.Denies any pain, fever, chairs recently. No recent falls or injuries reported during this visit. 05/07/18 on evaluation today patient actually appears to be doing very well in regard to his lower extremities in general all things considered. With that being said he is having some pain in the legs just due to the amount of swelling. He does have an area where he had a blister on the left lateral lower extremity this is Adam Merritt, Adam Merritt ([id number]) [id number] Page 10 of 18 open at this point other than that there's nothing else weeping at this time. 05/14/18 on evaluation today patient actually appears to be doing excellent all things considered in regard to his lower extremities. He still has a couple areas of weeping on each leg which has continued to be the issue for him. He does have an appointment with the lymphedema clinic although this isn't until February 2020. That was the earliest they had. In the meantime he has continued to tolerate the compression wraps without complication. 05/28/18 on evaluation today patient actually appears to be doing more poorly in regard to his left lower extremity where he has a wound open at this point. He also had a fall where he subsequently injured his right great toe which has led to an open wound at the site unfortunately. He has been tolerating the dressing changes without complication in general as far as the wraps are concerned that he has not been putting any dressing on the left 1st toe ulcer site. 06/11/18 on evaluation today patient appears to be doing much worse in regard to his bilateral lower  extremity ulcers. He has been tolerating the dressing changes without complication although his legs have not been wrapped more recently. Overall I am not very pleased with the way his legs appear. I do believe he needs to be back in compression wraps he still has not received his compression wraps from the [facility] hospital as of yet. 06/18/18 on evaluation today patient actually appears to be doing significantly better than last time I saw him. He has been tolerating the compression wraps without complication in the circumferential ulcers especially appear to be doing much better. His toe ulcer on the right in regard to the great toe is better although not as good as the legs in my pinion. No fevers chills noted 07/02/18 on evaluation today patient appears to be doing much better in regard to his lower extremity ulcers. Unfortunately since I last saw him he's had the distal portion of his right great toe if he dated it sounds as if this actually went downhill very quickly. I had only seen him a few days prior and the toe did not appear to be infected at that point subsequently became infected very rapidly and it was decided by the surgeon that the distal portion of the toe needed to be removed. The patient seems to be doing well in this regard he tells me. With that being said his lower extremities are doing better from the standpoint of the wounds although he is significantly swollen at this point. 07/09/18 on evaluation today patient appears to be doing better in regard to the wounds on his lower extremities. In fact everything is almost completely healed he is just a small area on the left posterior lower extremity that is open at this point. 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 With that being said he needs some kind  of compression it does sound like he may not be wearing his compression, that is the wraps, during the entire time between when he's here visit to visit. Apparently his wife took the current one off because it began to "fall apart". 07/16/18 on evaluation today patient appears to be extremely swollen especially in regard to his left lower extremity unfortunately. He also has a new skin tear over the left lower extremity and there's a smaller area on the right lower extremity as well. Unfortunately this seems to be due in part to blistering and fluid buildup in his leg. He did get the reduction wraps that were ordered by the [facility] hospital for him to go to lymphedema clinic. With that being said his wounds on the legs have not healed to the point to where they would likely accept them as a patient lymphedema clinic currently. We need to try to get this to heal. With that being said he's been taking his wraps off which is not doing him any favors at this point. In fact this is probably quite counterproductive compared to what needs to occur. We will likely need to increase to a four layer compression wrap and continue to also utilize elevation and he has to keep the wraps on not take them off as he's been doing currently hasn't had a wrap on since Saturday. 07/23/18 on evaluation today patient appears to be doing much better in regard to his bilateral lower extremities. In fact his left lower extremity which was the largest is actually 15 cm smaller today compared to what it was last time he was here in our clinic. This is obviously good news after just one week. Nonetheless the differences he actually kept the wraps on during the entire week this time. That's not typical for him. I do believe he understands a little bit better now the severity of the situation and why it's important for him to keep these wraps on. 07/30/18 on evaluation today patient actually appears to be doing rather well in regard to his  lower extremities. His legs are much smaller than they have been in the past and he actually has only one very small rudder superficial region remaining that is not closed on the left lateral/posterior lower extremity even this is almost completely close. I do believe likely next week he will be healed without any complications. I do think we need to continue the wraps however this seems to be beneficial for him. I also think it may be a good time for [place] to go ahead and see about getting the appointment with the lymphedema clinic which is supposed to be made for him in order to keep this moving along and hopefully get them into compression wraps that will in the end help him to remain healed. 08/06/18 on evaluation today patient actually appears to be doing very well in regard as bilateral lower extremities. In fact his wounds appear to be completely healed at this time. 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 He is in the process of getting appointment with the lymphedema clinic we have made this referral were just waiting to hear back on the schedule time. We need to follow up on that today as well. 08/13/18 on evaluation today patient actually appears to be doing very well in regard to his  bilateral lower extremities there are no open wounds at this point. We are gonna go ahead and see about ordering the Velcro compression wraps for him had a discussion with them about [place] doing it versus the [place] they feel like they can definitely afford going ahead and get the wraps themselves and they would prefer to try to avoid having to go to the lymphedema clinic if it all possible which I completely understand. As long as he has good compression I'm okay either way. 3/17//20 on evaluation today patient actually appears to be doing well in regard to his bilateral lower extremity ulcers. He has been tolerating the dressing changes without complication  specifically the compression wraps. Overall is had no issues my fingers finding that I see at this point is that he is having trouble with constipation. He tells me he has not been able to go to the bathroom for about six days. He's taken to over-the-counter oral laxatives unfortunately this is not helping. He has not contacted his doctor. 08/27/18 on evaluation today patient appears to be doing fairly well in regard to his lower extremities at this point. There does not appear to be any new altars is swelling is very well controlled. We are still waiting for his Velcro compression wraps to arrive that should be sometime in the next week is Artie sent the check for these. 09/03/18 on evaluation today patient appears to be doing excellent in regard to his bilateral lower extremities which he shows no signs of wound openings in regard to this point. He does have his Velcro compression wraps which did arrive in the mail since I saw him last week. Overall he is doing excellent in my pinion. 09/13/18 on evaluation today patient appears to be doing very well currently in regard to the overall appearance of his bilateral lower extremities although he's a little bit more swollen than last time we saw him. At that point he been discharged without any open wounds. Nonetheless he has a small open wound on the posterior left lower extremity with some evidence of cellulitis noted as well. Fortunately I feel like he has made good progress overall with regard to his lower extremities from were things used to be. 09/20/18 on evaluation today patient actually appears to be doing much better. The erythematous lower extremity is improving wound itself which is still open appears to be doing much better as far as but appearance as well as pain is concerned overall very pleased in this regard. There's no signs of active infection at this time. 09/27/18 on evaluation today patient's wounds on the lower extremity actually  appear to be doing fairly well at this time which is good news. There is no evidence of active infection currently and again is just as left lower extremity were there any wounds at this point anyway. I believe they may be completely healed but again I'm not 100% sure based on evaluation today. I think one more week of observation would likely be a good idea. 10/04/18 on evaluation today patient actually appears to be doing excellent in regard to the left lower extremity which actually appears to be completely healed as of today. Unfortunately he's been having issues with his right lower extremity have a new wound that has opened. Fortunately there's no evidence of active infection at this time which is good news. 10/10/18 upon evaluation today patient actually appears to be doing a little bit worse with the new open area on his right posterior lower  extremity. He's been tolerating the dressing changes without complication. Right now we been using his compression wraps although I think we may need to switch back to actually Adam Merritt, Adam Merritt ([id number]) [id number] Page 11 of 18 performing bilateral compression wraps are in the clinic. No fevers, chills, nausea, or vomiting noted at this time. 10/17/18 on evaluation today patient actually appears to be doing quite well in regard to his bilateral lower extremity ulcers. He has been tolerating the reps without complication although he would prefer not to rewrap his legs as of today. Fortunately there's no signs of active infection which is good news. No fevers, chills, nausea, or vomiting noted at this time. 10/24/18 on evaluation today patient appears to be doing very well in regard to his lower extremities. His right lower extremity is shown signs of healing and his left lower Trinity though not healed appears to be improving which is excellent news. Overall very pleased with how things seem to be progressing at this time. The  patient is likewise happy to hear this. His Velcro compression wraps however have not been put on properly will gonna show his wife how to do that properly today. 10/31/18 on evaluation today patient appears to be doing more poorly in regard to his left lower extremity in particular although both lower extremities actually are showing some signs of being worse than my previous evaluation. Unfortunately I'm just not sure that his compression stockings even with the use of the compression/lymphedema pumps seem to be controlling this well. Upon further questioning he tells me that he also is not able to lie flat in the bed due to his congestive heart failure and difficulty breathing. For that reason he sleeps in his recliner. T make matters worse his recliner also cannot even hold his legs up o so instead of even being somewhat elevated they pretty much hang down to the floor. This is the way he sleeps each night which is definitely counterproductive to everything else that were attempting to do from the standpoint of controlling his fluid. Nonetheless I think that he potentially could benefit from a hospital bed although this would be something that his primary care provider would likely have to order since anything that is order on our side has to be directly related to wound care and again the hospital bed is not necessarily a direct relation to although I think it does contribute to his overall wound status on his lower extremities. 11/07/18 on evaluation today patient appears to be doing better in regard to his bilateral lower extremity. He's been tolerating the dressing changes without complication. We did in the interim since I last saw him switch to just using extras orbiting new alginate and that seems to have done much better for him. I'm very pleased with the overall progress that is made. 11/14/18 on evaluation today patient appears to be redoing rather well in regard to his left lower extremity  ulcers which are the only ones remaining at this point. Fortunately there's no signs of active infection at this time which is good news. Overall been very pleased with how things seem to be progressing currently. No fevers, chills, nausea, or vomiting noted at this time. 11/20/17 on evaluation today patient actually appears to be doing quite well in regard to his lower extremities on the left on the right he has several blisters that showed up although there's some question about whether or not he has had his broker compression wraps on like he was  supposed to or not. Fortunately there's no signs of active infection at this time which is good news. Unfortunately though he is doing well from the standpoint of the left leg the right leg is not doing as well again this is when we did not wrap last week. 11/28/18 on evaluation today patient appears to be doing well in regard to his bilateral lower extremities is left appears to be healed is right is not healed but is very close to being so. Overall very pleased with how things seem to be progressing. Patient is likewise happy that things are doing well. 12/09/18 on evaluation today patient actually appears to be completely healed which is excellent news. He actually seems to be doing well in regard to the swelling of the bilateral lower extremities which is also great news. Overall very pleased with how things seem to be progressing. Readmission: 03/18/2019 upon evaluation today patient presents for reevaluation concerning issues that he is having with his left lower extremity where he does have a wound noted upon inspection today. He also has a wound on the right second toe on the tip where it is apparently been rubbing in his shoe I did look at issues and you can actually see where this has been occurring as well. Fortunately there is no signs of infection with regard to the toe necessarily although it is very swollen compared to normal that does have me  somewhat concerned about the possibility of further evaluation for infection/osteomyelitis. I would recommend an x-ray to start with. Otherwise he states that it is been 1-2 weeks that he has had the draining in regard to left lower extremity he does not know how this happened he has been wearing his compression appropriately and I do see that as well today but nonetheless I do think that he needs to continue to be very cautious with regard to elevation as well. 03/25/2019 on evaluation today patient appears to be doing much better in regard to his lower extremity at this time. That is on the left. He did culture positive for Pseudomonas but the good news is he seems to be doing much better the gentamicin we applied topically seems to have done a great job for him. There is no signs of active infection at this time systemically and locally he is doing much better. No fevers, chills, nausea, vomiting, or diarrhea. In regard to his right toe this seems to be doing much better there is very little pressure noted at this point I did clean up some of the callus today around the edges of the wound as well as the surface of the wound but to be honest he is progressing quite nicely. 10/30; the area on the left anterior lower tibia area is healed over. His edema control is adequate even though his use of the compression pumps seems very intermittent. He has a juxta lite stocking on the right leg and a think there is 1 for the left leg at home. His wife is putting these on. He has an area on the plantar right second toe which is a hammertoe they are trying to offload this 04/08/2019 on evaluation today patient appears to be doing well in regard to his lower extremities which is good news. We are seeing him today for his toe ulcer which is giving him a little bit more trouble but again still seems to be doing better at this point which is good news. He is going require some sharp debridement in  regard to the toe  today however. 04/15/2019 on evaluation today patient actually appears to be doing quite well with regard to his toe ulcer. I am very pleased with how things are doing in that regard. With regard to his lower extremity edema in general he tells me he is not been using the lymphedema pumps regularly although he does not use them. I think that he needs to use them more regularly he does have a small blister on the left lower extremity this is not open at this point but obviously it something that can get worse if he does not keep the compression going and the pumps going as well. 04/22/2019 on evaluation today patient appears to be doing well with regard to his toe ulcer. He has been tolerating the dressing changes without complication. This is measuring slightly smaller this week as compared to last week. Fortunately there is no signs of active infection at this time. 05/06/2019 on evaluation today patient actually appears to be doing excellent. In fact his toe appears to be completely healed which is great news. There is no signs of active infection at this time. Readmission: Patient presents today for follow-up after having been discharged at the beginning of December 2020. He states that in recent weeks things have reopened and has been having more trouble at this point. With that being said fortunately there is no signs of active infection at this time. No fever chills noted. Nonetheless he also does have an issue with one of his toes as well that seems to be somewhat this is on the right foot second toe. 07/25/2019 upon evaluation today patient's lower extremities appear to be doing excellent bilaterally. I really feel like he is showing signs of great improvement and in fact I think he is close to healing which is great news. There is no signs of active infection at this time. No fevers, chills, nausea, vomiting, or diarrhea. 07/31/2019 upon evaluation today patient appears to be doing excellent and  in fact I think may be completely healed in regard to his right lower extremity that were still to monitor this 1 more week before closing it out. The left lower extremity is measuring smaller although not completely closed seems to be doing excellent. 08/07/2019 upon evaluation today patient appears to be doing excellent in regard to his wounds. In fact the right lower extremity is completely healed there is no issues here. The left lower extremity has 1 very small area still remaining fortunately there is no signs of infection and overall I think he is very close to closure of the here as well. 08/14/19 upon evaluation today patient appears to be doing excellent in regard to his lower extremities. In fact he appears to be completely healed as of today. Adam Merritt, Adam Merritt ([id number]) [id number] Page 12 of 18 Fortunately there's no signs of active infection at this time. No fevers, chills, nausea, or vomiting noted at this time. READMISSION 09/26/2019 This is a 76 year old man who is well-known to this clinic having just been discharged on 08/14/2019. He has known lymphedema and chronic venous insufficiency. He has Farrow wrap stockings and also external compression pumps. He does not use the external compression pumps however. He does however apparently fairly faithfully use the stockings. They have not been lotion in his legs. He has developed new wounds on the right anterior as well as the left medial left lateral and left posterior calf. He returns for our review of this Past medical history includes  coronary artery disease, hypertension, congestive heart failure, COPD, venous insufficiency with lymphedema, type 2 diabetes. He apparently has compression pumps, Farrow wraps and at 1 point a hospital bed although I believe he sleeps in a recliner 10/03/2019 upon evaluation today patient appears to be doing better with regard to his wounds in general. He has been tolerating the  dressing changes without complication. Fortunately there is no signs of active infection. No fevers, chills, nausea, vomiting, or diarrhea. I do believe the compression wraps are helping as they always have in the past. 10/10/2019 upon evaluation today patient appears to be doing excellent at this point. His right lower extremity is measuring much better and in fact is completely healed. His left lower extremity is also measuring better though not healed seems to be doing excellent at this point which is great news. Overall I am very pleased with how things appear currently. 10/27/2019 upon evaluation today patient appears to be doing quite well with regard to his wounds currently. He has been tolerating the dressing changes without complication. Fortunately there is no signs of active infection at this time. In fact he is almost completely healed on the left and has just a very small area open and on the right he is completely healed. 11/04/2019 upon evaluation today patient appears to be doing quite well with regard to his wounds. In fact he appears to be quite possibly completely healed on the left lower extremity now as well the right lower extremity is still doing well. With that being said unfortunately though this may be healed I think it is a very fragile area and I cannot even confirm there is not a very small opening still remaining. I really think he may benefit from 1 additional weeks wrapping before discontinuing. 11/10/2019 upon evaluation today patient appears to be doing well in regard to his original wounds in fact everything that we were treating last week is completely healed on the left. Unfortunately he has a new skin tear just above where the wrap slipped down due to a fall he sustained causing this injury. 11/17/2019 on evaluation today patient appears to be doing better with regard to his wound. He has been tolerating the dressing changes without complication. Fortunately there is no  signs of active infection at this time. No fevers, chills, nausea, vomiting, or diarrhea. 11/24/2019 upon evaluation today patient appears to be doing well with regard to his wound. This is making good progress there does not appear to be signs of active infection but overall I do feel like he is headed in the correct direction. Overall he is tolerating the compression wrap as well. 12/02/2019 upon evaluation today patient appears to be doing well with regard to his leg ulcer. In fact this appears to be completely healed and this is the last of the wounds that he had but still the left anterior lower extremity. Fortunately there is no signs of active infection at this time. No fevers, chills, nausea, vomiting, or diarrhea. Readmission: 02/05/2020 on evaluation today patient appears to be doing well with regard to his wound all things considered. He actually tells me that a couple weeks ago he had trouble with his wraps and therefore took him off for a couple of days in order to give his legs a break. It was during this time that he actually developed increased swelling and edema of his legs and blisters on both legs. That is when he called to make the appointment. Nonetheless since that time he  began wearing his compression wraps which are Velcro in the meantime and has done extremely well with this. In fact his leg appears to be under good control as far as edema is concerned today it is nothing like what I am seeing when he has come in for evaluations in the past. They have been using some of the silver alginate at home which has been beneficial for him. Fortunately there is no signs of active infection at this time. No fevers, chills, nausea, vomiting, or diarrhea. 02/19/2020 on evaluation today patient unfortunately does not appear to be doing quite as well as I would like to see. He has no signs of active infection at this time but unfortunately he is not doing well in regard to the overall appearance  of his left leg. He has a couple other areas that are weeping now and the leg is much more swollen. I think that he needs to actually have a compression wrap to try to manage this. 02/26/2020 on evaluation today patient appears to be doing well with regard to his left lower extremity ulcer. Fortunately the swelling is down I do believe the pressure wrap seems to be doing quite well. Fortunately there is no signs of active infection at this time. 03/05/2020 upon evaluation today patient appears to be doing excellent in regard to his legs at this point. Fortunately there is no signs of active infection which is great news. Overall he feels like he is completely healed which is great news. Readmission: 05/20/2020 upon evaluation today patient appears to be doing well with regard to his leg ulcer all things considered. He is actually coming back to see [place] after having had a reopening of the ulcers on his left leg in the past several weeks. He is out of dressing supplies and his wife is been try to take care of this as best she can. 06/10/2020 upon evaluation today patient unfortunately appears to be doing significantly worse today compared to when I last saw him. He and his wife both tell me that "there has been a lot going on". I did not actually go into detail about everything that has been happening but nonetheless he has not obviously been wearing his compression. He brings them with him today but they were not on. I am not even certain to be honest that he could get those on at this point. Nonetheless I do believe that he is going to require compression wrapping at some point shortly but right now we need to try to get what appears to be infection under control at this time. 06/28/2020 upon evaluation today patient appears to be doing well with regard to 06/28/2020 upon evaluation today patient appears to be doing well pretty well in regard to his left leg which is much better than previous. With that  being said in regard to his right leg he does seem to be having some issues with a new wound after he sustained a fall. This fortunately is not too deep but does appear to be something we need to address. Neither deep which is good. 07/05/2020 on evaluation today patient appears to be doing well with regard to his wounds. In fact everything on the left leg appears to be pretty much healed on the right leg this is very close though not completely closed yet. Fortunately there is no evidence of active infection at this time. No fevers, chills, nausea, vomiting, or diarrhea. 07/13/2019 upon evaluation today patient appears to be making good  progress which is great news. Overall I am extremely pleased with where things stand today. There does not appear to be any signs of active infection which is great news and overall his left leg appears healed still the right leg though not healed does seem to be making good progress which is excellent news. He is having no pain. 07/19/2020 on evaluation today patient appears to be doing well with regard to his wound. He has been tolerating the dressing changes without complication. Fortunately there does not appear to be any signs of active infection at this time. No fevers, chills, nausea, vomiting, or diarrhea. Readmission: Adam Merritt, Adam Merritt ([id number]) [id number] Page 13 of 18 08/20/2020 on evaluation today patient presents for reevaluation here in the clinic concerning issues that he has been having with his bilateral lower extremities. Unfortunately this is an ongoing reoccurring issue. He tells me that he is really not certain exactly what caused the issue although as I discussed this further with him it appears that he has been sitting for the most part and sleeping in his chair with his feet on the ground he is not really elevating at all in fact the chair does not even have a reclining function therefore it is basically just him sitting  with his feet on the ground. I think this alone has probably led to his increased swelling he is also having more difficulty walking which means that he is pumping less blood flow through his legs anyway as far as the venous stasis is concerned. All in all I think this is leading to a downward spiral of him not being able to walk very well as his legs are so heavy, they begin to weep, and overall he just does not seem to be doing as well as it was when I last saw him. 08/27/2020 upon evaluation today patient's legs actually are showing signs of improvement which is great news. There does not appear to be any evidence of infection which is also excellent news. I am extremely pleased with where things stand today. No fevers, chills, nausea, vomiting, or diarrhea. 09/03/20 upon evaluation today patient appears to be doing excellent in regard to his wounds currently. He has been tolerating the dressing changes without complication and overall I am extremely pleased with where things stand today. No fevers, chills, nausea, vomiting, or diarrhea. Overall he is healed on the right leg although the left leg not being completely healed still is doing significantly better. 09/10/2020/8/22 upon evaluation today patient appears to be doing well with regard to his wounds. He has been tolerating the dressing changes without complication. Fortunately he appears to be completely healed. I am very pleased in that regard. As is the patient his right leg is also doing well he did get a new recliner which is helping him keep his feet off the ground which I think is helped he is down 2 cm on the left compared to last week so I think there is some definite improvement here. Readmission 12/15/20: Mr. Magnum Lunde is a 76 year old male with a past medical history of lymphedema, insulin-dependent type 2 diabetes, COPD and congestive heart failure that presents to the clinic for increased drainage from his legs bilaterally, Left  greater than right. Patient has lymphedema. He has been seen in our clinic on multiple occasions for this issue. He currently denies signs of infection. He has not been using his lymphedema pumps. 12/23/2020 upon evaluation today patient appears to be doing well with regard  to his wounds. He has been tolerating the dressing changes he really does not have any open wounds just weeping areas of the legs bilaterally and fortunately seems to be doing better at this point. 12/28/2020 upon evaluation today patient's wound is actually showing signs of good improvement. There does not appear to be any evidence of infection which is great news and overall I am extremely pleased at this point. I think is very close to complete resolution. 01/04/2021 upon evaluation today patient actually appears to be completely healed which is great news. I do not see any signs of active infection currently which is also great news. In general I am extremely pleased with where we stand and I do believe that the patient is tolerating the dressing changes without complication. I believe he is ready to go back into his Velcro compression and I did see that they brought them with him today. Readmission: Patient presents today for follow-up he was last seen August 2. He is having some issues with some mild drainage and leaking from the left leg. This is something that has reopened and again is a recurrent issue for him. Subsequently he does wear his Velcro compression wraps though I think we may want to look into something a little bit better for him I am thinking that we may want to try the juxta fit compression which I think would do much more effective compression treatment overall for him. 04/08/2021 upon evaluation today patient appears to be doing well with regard to his legs. I do feel like he is showing signs of improvement which is great news. No fevers, chills, nausea, vomiting, or diarrhea. 04/15/2021 upon evaluation today  patient's wounds actually appear to be very close to complete resolution. He is legs are actually doing well left leg just has a small area of leaking at this point. Overall I think that we are very close to complete resolution based on what I am seeing. Nonetheless he seems to not be feeling too well today I really do not know what exactly is going on here. 04/22/2021 upon evaluation patient appears to be completely healed based on what I see today. I do not see anything open both legs appear to be doing quite well. 05/20/2021 upon evaluation today patient appears to be doing poorly in regard to his legs. He has not been a full month since we last saw him and he has reopened unfortunately. He does not appear to be any signs of active infection at this time which is great news. No fevers, chills, nausea, vomiting, or diarrhea. 12/27; the patient comes in the clinic today with severe bilateral lymphedema very dry skin but paradoxically his wounds are actually healed or should I say not open. He has compression pumps that he does not use. [Once per week] he does not have compression stockings. He comes in today with a small presumably pressure ulcer on the lateral plantar heel on the right. The patient states he has been complaining about pain in this area for about 2 months although no wound was recognized until our intake nurse was doing her exam 06/16/2021 upon evaluation today patient appears to be doing better in regard to his wounds. He is not completely healed yet especially in regard to the right heel which is still open. Nonetheless I do think that his left leg is almost completely healed the right leg appears to be doing decently well his right heel is what still mainly open currently. 06/30/2021 upon evaluation  today patient appears to be doing well with regard to his wound. Has been tolerating the dressing changes without complication. Fortunately I think his legs are doing quite well the  heel is also doing better. In general I am very pleased with where we stand today. 07/14/2021 upon evaluation today patient appears to be doing well with regard to his heel ulcer. Fortunately I think that it is showing some signs of improvement unfortunately there is a little bit of skin overlying the wound surface which is actually causing this to breakdown a little bit to be honest. It looks a little macerated. Nonetheless we need to clean this away. 07/28/2021 upon evaluation today patient appears to be doing well currently in regard to his heel ulcer which is almost completely closed and this is great news. Lets about the end of where the great news stands. In regard to his legs unfortunately both legs are weeping at this point there really are not any true open wounds. Nonetheless I do believe that working half the compression wrap him in order to get this under control. I discussed that with the patient and his wife today. 08/04/2021 upon evaluation today patient appears to be doing well currently in regard to his legs. Overall things are doing quite nicely. I do not see any evidence of active infection locally or systemically at this time which is great news and overall I am extremely pleased with where we stand today. No fevers, chills, nausea, vomiting, or diarrhea. 3/9; patient presents for follow-up. He has been using his lymphedema pumps twice daily with improvement in lower extremity swelling. He has no issues or complaints today. He denies signs of infection. 08/18/2021 upon evaluation patient appears to be doing excellent at this point today. In fact he really does not have any open wounds whatsoever which is great news. Honestly I do think that the patient is tolerating the compression wraps quite nicely and his legs are significantly smaller he is also using his lymphedema pumps and he is able to lift his legs. He is walking better and getting up and down better everything about what is  going on at this point seems to be doing excellent and very pleased with how the patient is doing currently. 08/25/2021 upon evaluation today patient appears to be doing very well in regard to his wound. In fact everything on his heel as well as his legs are showing Adam Merritt, Adam Merritt ([id number]) [id number] Page 14 of 18 signs of good improvement. Everything is closed up at this point. Readmission: 04-18-2022 upon evaluation today patient appears to be doing poorly in regard to his bilateral lower extremities. Has been tolerating the dressing changes with his wife at home she has been putting on what she could do to try to help protect the area she tells me a lot of times she has been using lotion as well as the [place] 4000 wraps which to be honest are really not the best thing for him. They do not fit his legs fully and do not get help keep it compressed appropriately. Fortunately there does not appear to be any signs of infection at this time which is good news. Nonetheless I do believe we need to get his swelling under control. Patient History Information obtained from Patient. Allergies Corticosteroids (Glucocorticoids) Family History Cancer - Paternal Grandparents, Kidney Disease - Siblings, Lung Disease - Mother, No family history of Diabetes, Heart Disease, Hereditary Spherocytosis, Hypertension, Seizures, Stroke, Thyroid Problems, Tuberculosis. Social History Never smoker, Marital  Status - Married, Alcohol Use - Never, Drug Use - No History, Caffeine Use - Never. Medical History Eyes Denies history of Cataracts, Glaucoma, Optic Neuritis Ear/Nose/Mouth/Throat Denies history of Chronic sinus problems/congestion, Middle ear problems Hematologic/Lymphatic Patient has history of Anemia - aplastic anemia, Lymphedema Denies history of Hemophilia, Human Immunodeficiency Virus, Sickle Cell Disease Respiratory Patient has history of Chronic Obstructive Pulmonary  Disease (COPD), Sleep Apnea Denies history of Aspiration, Asthma, Pneumothorax, Tuberculosis Cardiovascular Patient has history of Congestive Heart Failure, Coronary Artery Disease, Hypertension, Peripheral Venous Disease Denies history of Angina, Arrhythmia, Deep Vein Thrombosis, Hypotension, Myocardial Infarction, Peripheral Arterial Disease, Phlebitis, Vasculitis Gastrointestinal Denies history of Cirrhosis , Colitis, Crohnoos, Hepatitis A, Hepatitis B, Hepatitis C Endocrine Patient has history of Type II Diabetes Denies history of Type I Diabetes Genitourinary Denies history of End Stage Renal Disease Immunological Denies history of Lupus Erythematosus, Raynaudoos, Scleroderma Integumentary (Skin) Denies history of History of Burn, History of pressure wounds Musculoskeletal Patient has history of Osteoarthritis Denies history of Gout, Rheumatoid Arthritis, Osteomyelitis Neurologic Patient has history of Neuropathy Denies history of Dementia, Quadriplegia, Paraplegia, Seizure Disorder Oncologic Patient has history of Received Chemotherapy - last dose 2006 Denies history of Received Radiation Psychiatric Denies history of Anorexia/bulimia, Confinement Anxiety Hospitalization/Surgery History - bronchitis. Medical A Surgical History Notes nd Respiratory home O2 at night as needed Cardiovascular varicose veins Neurologic CVA - 1992 Objective Constitutional Well-nourished and well-hydrated in no acute distress. Vitals Time Taken: 8:49 AM, Height: 66 in, Source: Stated, Weight: 304 lbs, Source: Stated, BMI: 49.1, Temperature: 98.3 F, Pulse: 67 bpm, Respiratory Rate: 18 breaths/min, Blood Pressure: 162/90 mmHg. Adam Merritt, Adam Merritt ([id number]) [id number] Page 15 of 18 Respiratory normal breathing without difficulty. Psychiatric this patient is able to make decisions and demonstrates good insight into disease process. Alert and Oriented x 3. pleasant  and cooperative. General Notes: Upon inspection patient's wound bed actually showed signs of good granulation and epithelization at this point. Fortunately there does not appear to be any evidence of active infection locally or systemically which is great news and overall I am extremely pleased with where we stand currently. I do think he has a lot of weeping areas are not any specific wounds open at this point. Other Condition(s) Patient presents with Lymphedema located on the Right Leg. Patient presents with Lymphedema located on the Left Leg. 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 Home Health: [facility] for wound care. May utilize formulary equivalent dressing for wound treatment orders unless otherwise specified. Home Health Nurse may visit PRN to address patientoos wound care needs. Marta Lamas [phone] Bathing/ Shower/ Hygiene: May shower; gently cleanse wound with antibacterial soap, rinse and pat dry prior to dressing wounds Edema Control - Lymphedema / Segmental Compressive Device / Other: Optional: One layer of unna paste to top of compression wrap (to act as an anchor). 4 Layer Compression System Lymphedema. Elevate, Exercise Daily and Avoid Standing for Long Periods of Time. Elevate legs to the level of the heart and pump ankles as often as possible Elevate leg(s) parallel to the floor when sitting. Compression Pump: Use compression pump on left lower extremity for 60 minutes, twice daily. Compression Pump: Use compression pump on right lower  extremity for 60 minutes, twice daily. 1. I am good recommend  currently that we have the patient going continue to monitor for any signs of worsening or infection. Obviously based on what I am seeing currently I do believe that he is in need of compression I do believe the bilateral 4-layer compression wrap would be beneficial. 2. I am going to suggest as well he should be using his lymphedema pumps and elevate his legs much as possible. 3. I am also can recommend the patient should continue to monitor for any signs of infection or worsening if anything changes he knows contact the office and let me know. 4. We are going to need to order him Wallie Char basic wraps as his are worn out and the [place] 4000 really is not sufficient to keep his swelling under control. His wife voiced understanding and she is happy for them to pay for them as soon as we can order them. We will see patient back for reevaluation in 1 week here in the clinic. If anything worsens or changes patient will contact our office for additional recommendations. Electronic Signature(s) Signed: 04/18/2022 10:08:14 AM By: Worthy Keeler PA-C Entered By: Worthy Keeler on 04/18/2022 10:08:14 [name] ([id number]) [id number] Page 16 of 18 -------------------------------------------------------------------------------- ROS/PFSH Details Patient Name: Date of Service: [name]. 04/18/2022 8:45 A M Medical Record Number: [id number] Patient Account Number: [id number] Date of Birth/Sex: Treating RN: [birth year] (76 y.o. [name]) Carlene Coria Primary Care Provider: [name] Other Clinician: Referring Provider: Treating Provider/Extender: Jeri Cos Self, Referral [name]: 0 Information Obtained From Patient Eyes Medical History: Negative for: Cataracts; Glaucoma; Optic Neuritis Ear/Nose/Mouth/Throat Medical History: Negative for: Chronic sinus problems/congestion; Middle ear problems Hematologic/Lymphatic Medical History: Positive for: Anemia  - aplastic anemia; Lymphedema Negative for: Hemophilia; Human Immunodeficiency Virus; Sickle Cell Disease Respiratory Medical History: Positive for: Chronic Obstructive Pulmonary Disease (COPD); Sleep Apnea Negative for: Aspiration; Asthma; Pneumothorax; Tuberculosis Past Medical History Notes: home O2 at night as needed Cardiovascular Medical History: Positive for: Congestive Heart Failure; Coronary Artery Disease; Hypertension; Peripheral Venous Disease Negative for: Angina; Arrhythmia; Deep Vein Thrombosis; Hypotension; Myocardial Infarction; Peripheral Arterial Disease; Phlebitis; Vasculitis Past Medical History Notes: varicose veins Gastrointestinal Medical History: Negative for: Cirrhosis ; Colitis; Crohns; Hepatitis A; Hepatitis B; Hepatitis C Endocrine Medical History: Positive for: Type II Diabetes Negative for: Type I Diabetes Time with diabetes: since 2006 Treated with: Insulin Blood sugar tested every day: Yes Tested : Blood sugar testing results: Breakfast: 135 Genitourinary Medical History: Negative for: End Stage Renal Disease Immunological Medical History: Negative for: Lupus Erythematosus; Raynauds; Scleroderma Integumentary (Skin) Medical History: Negative for: History of Burn; History of pressure wounds Musculoskeletal Medical History: Positive for: Osteoarthritis AVIYON, HOCEVAR ([id number]) [id number] Page 17 of 18 Negative for: Gout; Rheumatoid Arthritis; Osteomyelitis Neurologic Medical History: Positive for: Neuropathy Negative for: Dementia; Quadriplegia; Paraplegia; Seizure Disorder Past Medical History Notes: CVA - 1992 Oncologic Medical History: Positive for: Received Chemotherapy - last dose 2006 Negative for: Received Radiation Psychiatric Medical History: Negative for: Anorexia/bulimia; Confinement Anxiety Immunizations Pneumococcal Vaccine: Received Pneumococcal Vaccination: Yes Received Pneumococcal  Vaccination On or After 60th Birthday: No Implantable Devices None Hospitalization / Surgery History Type of Hospitalization/Surgery bronchitis Family and Social History Cancer: Yes - Paternal Grandparents; Diabetes: No; Heart Disease: No; Hereditary Spherocytosis: No; Hypertension: No; Kidney Disease: Yes - Siblings; Lung Disease: Yes - Mother; Seizures: No; Stroke: No; Thyroid Problems: No; Tuberculosis: No; Never smoker; Marital Status - Married; Alcohol  Use: Never; Drug Use: No History; Caffeine Use: Never; Financial Concerns: No; Food, Clothing or Shelter Needs: No; Support System Lacking: No; Transportation Concerns: No Electronic Signature(s) Signed: 04/19/2022 2:43:36 PM By: Worthy Keeler PA-C Signed: 04/21/2022 1:14:49 PM By: Carlene Coria RN Entered By: Carlene Coria on 04/18/2022 08:50:12 -------------------------------------------------------------------------------- SuperBill Details Patient Name: Date of Service: Adam Seller W. 04/18/2022 Medical Record Number: [id number] Patient Account Number: [id number] Date of Birth/Sex: Treating RN: 1946/02/19 (76 y.o. [name] Primary Care Provider: [name] Other Clinician: Referring Provider: Treating Provider/Extender: Jeri Cos Self, Referral [name]: 0 Diagnosis Coding ICD-10 Codes Code Description I89.0 Lymphedema, not elsewhere classified I87.333 Chronic venous hypertension (idiopathic) with ulcer and inflammation of bilateral lower extremity L97.822 Non-pressure chronic ulcer of other part of left lower leg with fat layer exposed JAHFARI, AMBERS ([id number]) [id number] Page 18 of 18 L97.812 Non-pressure chronic ulcer of other part of right lower leg with fat layer exposed Facility Procedures : CPT4 Code: 31121624 Description: [phone] - WOUND CARE VISIT-LEV 2 EST PT Modifier: Quantity: 1 Physician Procedures : CPT4 Code Description Modifier [id number] - WC PHYS  LEVEL 4 - EST PT ICD-10 Diagnosis Description I89.0 Lymphedema, not elsewhere classified I87.333 Chronic venous hypertension (idiopathic) with ulcer and inflammation of bilateral lower extremity  L97.822 Non-pressure chronic ulcer of other part of left lower leg with fat layer exposed L97.812 Non-pressure chronic ulcer of other part of right lower leg with fat layer exposed Quantity: 1 Electronic Signature(s) Signed: 04/19/2022 2:43:36 PM By: Worthy Keeler PA-C Signed: 04/21/2022 1:14:49 PM By: Carlene Coria RN Previous Signature: 04/18/2022 10:09:00 AM Version By: Worthy Keeler PA-C Entered By: Carlene Coria on 04/18/2022 10:17:45

## 2022-04-25 ENCOUNTER — Telehealth: Payer: Self-pay

## 2022-04-25 ENCOUNTER — Telehealth: Payer: Self-pay | Admitting: Nurse Practitioner

## 2022-04-25 ENCOUNTER — Encounter: Payer: Self-pay | Admitting: Nurse Practitioner

## 2022-04-25 ENCOUNTER — Telehealth (INDEPENDENT_AMBULATORY_CARE_PROVIDER_SITE_OTHER): Payer: Medicare PPO | Admitting: Nurse Practitioner

## 2022-04-25 ENCOUNTER — Encounter: Payer: Medicare PPO | Admitting: Physician Assistant

## 2022-04-25 VITALS — Resp 16 | Ht 66.0 in | Wt 286.0 lb

## 2022-04-25 DIAGNOSIS — I890 Lymphedema, not elsewhere classified: Secondary | ICD-10-CM

## 2022-04-25 DIAGNOSIS — E1142 Type 2 diabetes mellitus with diabetic polyneuropathy: Secondary | ICD-10-CM | POA: Diagnosis not present

## 2022-04-25 DIAGNOSIS — R296 Repeated falls: Secondary | ICD-10-CM

## 2022-04-25 DIAGNOSIS — E11622 Type 2 diabetes mellitus with other skin ulcer: Secondary | ICD-10-CM | POA: Diagnosis not present

## 2022-04-25 DIAGNOSIS — L89309 Pressure ulcer of unspecified buttock, unspecified stage: Secondary | ICD-10-CM

## 2022-04-25 DIAGNOSIS — Z741 Need for assistance with personal care: Secondary | ICD-10-CM | POA: Diagnosis not present

## 2022-04-25 DIAGNOSIS — R6889 Other general symptoms and signs: Secondary | ICD-10-CM | POA: Diagnosis not present

## 2022-04-25 DIAGNOSIS — L97812 Non-pressure chronic ulcer of other part of right lower leg with fat layer exposed: Secondary | ICD-10-CM | POA: Diagnosis not present

## 2022-04-25 DIAGNOSIS — I87333 Chronic venous hypertension (idiopathic) with ulcer and inflammation of bilateral lower extremity: Secondary | ICD-10-CM | POA: Diagnosis not present

## 2022-04-25 DIAGNOSIS — R531 Weakness: Secondary | ICD-10-CM | POA: Diagnosis not present

## 2022-04-25 DIAGNOSIS — L97822 Non-pressure chronic ulcer of other part of left lower leg with fat layer exposed: Secondary | ICD-10-CM | POA: Diagnosis not present

## 2022-04-25 NOTE — Telephone Encounter (Signed)
Lvm to schedule 2 mo follow up-Adam Merritt

## 2022-04-25 NOTE — Progress Notes (Signed)
[facility] [place], [place] [address]  Internal MEDICINE  Telephone Visit  Patient Name: Adam Merritt  [id number]  [id number]  Date of Service: 04/25/2022  I connected with the patient at 1110 by telephone and verified the patients identity using two identifiers.   I discussed the limitations, risks, security and privacy concerns of performing an evaluation and management service by telephone and the availability of in person appointments. I also discussed with the patient that there may be a patient responsible charge related to the service.  The patient expressed understanding and agrees to proceed.    Chief Complaint  Patient presents with   Telephone Screen   Telephone Assessment   Diabetes   Gastroesophageal Reflux   Hypertension   Hyperlipidemia    HPI Adam Merritt presents for a telehealth virtual visit for 1 month follow up --needs wheelchair order.  --doing better, physical therapy and nursing are coming to his home and working with him.  --has not received DME yet.  --wife states does not need hospital bed.    Current Medication: Outpatient Encounter Medications as of 04/25/2022  Medication Sig   albuterol (VENTOLIN HFA) 108 (90 Base) MCG/ACT inhaler Inhale 2 puffs into the lungs every 6 (six) hours as needed for wheezing or shortness of breath.   aspirin 325 MG EC tablet Take 325 mg by mouth daily.   atorvastatin (LIPITOR) 40 MG tablet Take 40 mg by mouth at bedtime.    azithromycin (ZITHROMAX) 250 MG tablet Take 1 tablet (250 mg total) by mouth daily. Use as directed for 5 days   carvedilol (COREG) 25 MG tablet Take 25 mg by mouth 2 (two) times daily with a meal.   Cholecalciferol (VITAMIN D3 PO) Take by mouth.   Continuous Blood Gluc Sensor (FREESTYLE LIBRE 2 SENSOR) MISC 1 Device by Does not apply route every 14 (fourteen) days.   docusate sodium (COLACE) 50 MG capsule Take 50 mg by mouth 2 (two) times daily.   febuxostat (ULORIC) 40 MG  tablet Take 1 tablet (40 mg total) by mouth daily.   fluticasone (FLOVENT HFA) 110 MCG/ACT inhaler Take 2 puffs in am and pm  for copd // chronic bronchitis ( rinse mouth)   furosemide (LASIX) 40 MG tablet TAKE 1 TABLET(40 MG) BY MOUTH DAILY   gabapentin (NEURONTIN) 300 MG capsule TAKE 2 CAPSULES BY MOUTH EVERY MORNING, 1 CAPSULE EVERY EVENING AND 2 CAPSULES AT NIGHT   gabapentin (NEURONTIN) 300 MG capsule TAKE 2 CAPSULES BY MOUTH EVERY MORNING, 1 CAPSULE EVERY EVENING, AND 2 CAPSULES AT NIGHT   glucose blood test strip Use as instructed to check blood sugars twice daily   hydrALAZINE (APRESOLINE) 25 MG tablet Take 25 mg by mouth 3 (three) times daily.    Insulin Glargine (BASAGLAR KWIKPEN) 100 UNIT/ML Inject 30 Units into the skin daily.   Insulin Pen Needle (BD PEN NEEDLE NANO U/F) 32G X 4 MM MISC Use as directed with insulin DX e11.65   ipratropium-albuterol (DUONEB) 0.5-2.5 (3) MG/3ML SOLN Take 3 mLs by nebulization every 4 (four) hours as needed (for shortness of breath).   liraglutide (VICTOZA) 18 MG/3ML SOPN INJECT 1.'8MG'$  INTO THE SKIN DAILY   mometasone (ELOCON) 0.1 % lotion Apply three times weekly as directed.   Multiple Vitamin (MULTIVITAMIN WITH MINERALS) TABS tablet Take 1 tablet by mouth daily.   ondansetron (ZOFRAN) 4 MG tablet Take 1 tablet (4 mg total) by mouth every 8 (eight) hours as needed.   OneTouch Delica  Lancets 33G MISC Use twice daily diag e11.65   pentoxifylline (TRENTAL) 400 MG CR tablet TAKE 1 TABLET BY MOUTH THREE TIMES DAILY FOR CLAUDICATION   potassium chloride SA (KLOR-CON M) 20 MEQ tablet TAKE 1 TABLET BY MOUTH TWICE DAILY   prazosin (MINIPRESS) 1 MG capsule TAKE THREE CAPSULES BY MOUTH EVERY EVENING FOR NIGHTMARES   predniSONE (DELTASONE) 10 MG tablet Take one tab 3 x day for 3 days, then take one tab 2 x a day for 3 days and then take one tab a day for 3 days for copd   sennosides-docusate sodium (SENOKOT-S) 8.6-50 MG tablet Take 1-2 tablets by mouth 2 (two)  times daily.   sertraline (ZOLOFT) 100 MG tablet Take 100 mg by mouth daily.    tiZANidine (ZANAFLEX) 2 MG tablet Take 1 tablet (2 mg total) by mouth 3 (three) times daily.   traZODone (DESYREL) 50 MG tablet Take 75 mg by mouth at bedtime.   triamcinolone cream (KENALOG) 0.1 % Apply 1 application topically 2 (two) times daily.   No facility-administered encounter medications on file as of 04/25/2022.    Surgical History: Past Surgical History:  Procedure Laterality Date   AMPUTATION TOE Right 06/23/2018   Procedure: AMPUTATION TOE;  Surgeon: Sharlotte Alamo, DPM;  Location: [facility] ORS;  Service: Podiatry;  Laterality: Right;   CHOLECYSTECTOMY     CORONARY ARTERY BYPASS GRAFT      Medical History: Past Medical History:  Diagnosis Date   Anemia    CAD (coronary artery disease)    Chest pain    Coronary artery disease    Diabetes mellitus without complication (HCC)    Diastolic CHF ([place])    Esophageal reflux    Herpes zoster without mention of complication    Hyperlipidemia    Hypertension    Insomnia    Lumbago    Lymphedema    Neuropathy in diabetes (HCC)    Obstructive chronic bronchitis without exacerbation    Other malaise and fatigue    Stroke ([place])    Varicose veins     Family History: Family History  Problem Relation Age of Onset   Diabetes Mother    Hyperlipidemia Mother    Hypertension Mother    Diabetes Father    Hyperlipidemia Father    Hypertension Father    CAD Father     Social History   Socioeconomic History   Marital status: Married    Spouse name: Not on file   Number of children: Not on file   Years of education: Not on file   Highest education level: Not on file  Occupational History   Occupation: retired  Tobacco Use   Smoking status: Never   Smokeless tobacco: Never  Vaping Use   Vaping Use: Never used  Substance and Sexual Activity   Alcohol use: No    Comment: quit alcohol 30 years ago   Drug use: No   Sexual activity: Not  Currently  Other Topics Concern   Not on file  Social History Narrative   Lives at home with wife, uses a wheelchair now   Social Determinants of Health   Financial Resource Strain: Low Risk  (04/10/2019)   Overall Financial Resource Strain (CARDIA)    Difficulty of Paying Living Expenses: Not hard at all  Food Insecurity: No Food Insecurity (04/10/2019)   Hunger Vital Sign    Worried About Running Out of Food in the Last Year: Never true    Ran Out of Food in  the Last Year: Never true  Transportation Needs: No Transportation Needs (04/10/2019)   PRAPARE - Hydrologist (Medical): No    Lack of Transportation (Non-Medical): No  Physical Activity: Inactive (04/10/2019)   Exercise Vital Sign    Days of Exercise per Week: 0 days    Minutes of Exercise per Session: 0 min  Stress: No Stress Concern Present (04/10/2019)   [place]    Feeling of Stress : Only a little  Social Connections: Moderately Integrated (04/10/2019)   Social Connection and Isolation Panel [NHANES]    Frequency of Communication with Friends and Family: More than three times a week    Frequency of Social Gatherings with Friends and Family: More than three times a week    Attends Religious Services: More than 4 times per year    Active Member of [employer] or Organizations: No    Attends Archivist Meetings: Never    Marital Status: Married  Human resources officer Violence: Not At Risk (04/10/2019)   Humiliation, Afraid, Rape, and Kick questionnaire    Fear of Current or Ex-Partner: No    Emotionally Abused: No    Physically Abused: No    Sexually Abused: No      Review of Systems  Constitutional:  Positive for activity change and fatigue.  HENT:  Positive for congestion and postnasal drip.   Respiratory:  Positive for cough, chest tightness, shortness of breath and wheezing.   Cardiovascular: Negative.  Negative for  chest pain and palpitations.  Gastrointestinal: Negative.  Negative for abdominal pain, constipation, diarrhea, nausea and vomiting.  Musculoskeletal:  Positive for arthralgias, back pain, gait problem and myalgias.  Skin:  Positive for wound (multiple).  Neurological:  Positive for weakness and numbness.  Hematological:  Positive for adenopathy.  Psychiatric/Behavioral:  Positive for behavioral problems, dysphoric mood and sleep disturbance. Negative for self-injury and suicidal ideas. The patient is nervous/anxious.     Vital Signs: Resp 16   Ht '5\' 6"'$  (1.676 m)   Wt 286 lb (129.7 kg)   BMI 46.16 kg/m    Observation/Objective: He is alert and oriented and engages in conversation appropriately. He does not appear to be in any acute distress over video call.      Assessment/Plan: 1. Generalized weakness - DME Wheelchair manual  2. Unable to stand up - DME Wheelchair manual  3. Lymphedema of both lower extremities - DME Wheelchair manual  4. Recurrent falls New wheelchair will help decrease falls - DME Wheelchair manual  5. Diabetic polyneuropathy associated with type 2 diabetes mellitus ([place]) - DME Wheelchair manual    General Counseling: Adam Merritt verbalizes understanding of the findings of today's phone visit and agrees with plan of treatment. I have discussed any further diagnostic evaluation that may be needed or ordered today. We also reviewed his medications today. he has been encouraged to call the office with any questions or concerns that should arise related to todays visit.  Return in about 2 months (around 06/25/2022) for F/U, [place] Needs PCP.   Orders Placed This Encounter  Procedures   DME Wheelchair manual    No orders of the defined types were placed in this encounter.   Time spent:10 Minutes Time spent with patient included reviewing progress notes, labs, imaging studies, and discussing plan for follow up.  [place] Controlled Substance Database was reviewed by  me for overdose risk score (ORS) if appropriate.  This patient was seen by  Jonetta Osgood, FNP-C in collaboration with Dr. [name] as a part of collaborative care agreement.  Torre Pikus R. Valetta Fuller, MSN, FNP-C Internal medicine

## 2022-04-25 NOTE — Telephone Encounter (Signed)
Received 04/05/22 Home orders from [facility]. Gave to Alyssa for signature-Toni

## 2022-04-25 NOTE — Progress Notes (Addendum)
Adam, Merritt ([id number]) [id number] Page 1 of 6 Visit Report for 04/25/2022 Arrival Information Details Patient Name: Date of Service: Adam Merritt [facility] W. 04/25/2022 11:00 A M Medical Record Number: [id number] Patient Account Number: [id number] Date of Birth/Sex: Treating RN: August 29, 1945 (76 y.o. Adam Merritt) Carlene Coria Primary Care Sevyn Paredez: [name] Other Clinician: Referring Phung Kotas: Treating Alayah Knouff/Extender: Cephus Richer [name]: 1 Visit Information History Since Last Visit All ordered tests and consults were completed: No Patient Arrived: Wheel Chair Added or deleted any medications: No Arrival Time: 11:09 Any new allergies or adverse reactions: No Accompanied By: wife Had a fall or experienced change in No Transfer Assistance: None activities of daily living that may affect Patient Identification Verified: Yes risk of falls: Secondary Verification Process Completed: Yes Signs or symptoms of abuse/neglect since last visito No Patient Requires Transmission-Based Precautions: No Hospitalized since last visit: No Patient Has Alerts: No Implantable device outside of the clinic excluding No cellular tissue based products placed in the center since last visit: Has Dressing in Place as Prescribed: Yes Has Compression in Place as Prescribed: Yes Pain Present Now: No Electronic Signature(s) Signed: 04/25/2022 11:27:25 AM By: Carlene Coria RN Entered By: Carlene Coria on 04/25/2022 11:27:25 -------------------------------------------------------------------------------- Clinic Level of Care Assessment Details Patient Name: Date of Service: Adam Merritt [facility] W. 04/25/2022 11:00 A M Medical Record Number: [id number] Patient Account Number: [id number] Date of Birth/Sex: Treating RN: 1945/12/11 (76 y.o. [name] Primary Care Keane Martelli: [name] Other Clinician: Referring Quinterrius Errington: Treating Chelcea Zahn/Extender: Cephus Richer [name]: 1 Clinic Level of Care Assessment Items TOOL 1 Quantity Score []  - 0 Use when EandM and Procedure is performed on INITIAL visit ASSESSMENTS - Nursing Assessment / Reassessment []  - 0 General Physical Exam (combine w/ comprehensive assessment (listed just below) when performed on new pt. evals) []  - 0 Comprehensive Assessment (HX, ROS, Risk Assessments, Wounds Hx, etc.) ASSESSMENTS - Wound and Skin Assessment / Reassessment []  - 0 Dermatologic / Skin Assessment (not related to wound area) ASSESSMENTS - Ostomy and/or Continence Assessment and Care []  - 0 Incontinence Assessment and Management []  - 0 Ostomy Care Assessment and Management (repouching, etc.) PROCESS - Coordination of Care []  - 0 Simple Patient / Family Education for ongoing care []  - 0 Complex (extensive) Patient / Family Education for ongoing care []  - 0 Staff obtains Consents, Records, T Results / Process Orders est []  - 0 Staff telephones HHA, Nursing Homes / Clarify orders / etc []  - 0 Routine Transfer to another Facility (non-emergent condition) []  - 0 Routine Hospital Admission (non-emergent condition) SAMIL, SAMORA ([id number]) [id number] Page 2 of 6 []  - 0 New Admissions / Biomedical engineer / Ordering NPWT Apligraf, etc. , []  - 0 Emergency Hospital Admission (emergent condition) PROCESS - Special Needs []  - 0 Pediatric / Minor Patient Management []  - 0 Isolation Patient Management []  - 0 Hearing / Language / Visual special needs []  - 0 Assessment of Community assistance (transportation, D/C planning, etc.) []  - 0 Additional assistance / Altered mentation []  - 0 Support Surface(s) Assessment (bed, cushion, seat, etc.) INTERVENTIONS - Miscellaneous []  - 0 External ear exam []  - 0 Patient Transfer (multiple staff / Civil Service fast streamer / Similar devices) []  - 0 Simple Staple / Suture removal (25 or less) []  - 0 Complex Staple / Suture  removal (26 or more) []  - 0 Hypo/Hyperglycemic Management (do not check if billed separately) []  - 0 Ankle /  Brachial Index (ABI) - do not check if billed separately Has the patient been seen at the hospital within the last three years: Yes Total Score: 0 Level Of Care: ____ Electronic Signature(s) Signed: 05/04/2022 4:41:36 PM By: Carlene Coria RN Entered By: Carlene Coria on 04/25/2022 12:34:36 -------------------------------------------------------------------------------- Compression Therapy Details Patient Name: Date of Service: Adam Merritt W. 04/25/2022 11:00 A M Medical Record Number: [id number] Patient Account Number: [id number] Date of Birth/Sex: Treating RN: Mar 13, 1946 (76 y.o. [name] Primary Care Kellyn Mccary: [name] Other Clinician: Referring Deone Leifheit: Treating Shacoya Burkhammer/Extender: Cipriano Bunker, Latricia Heft [name]: 1 Compression Therapy Performed for Wound Assessment: NonWound Condition Lymphedema - Right Leg Performed By: Clinician Carlene Coria, RN Compression Type: Four Layer Post Procedure Diagnosis Same as Pre-procedure Electronic Signature(s) Signed: 04/25/2022 12:34:00 PM By: Carlene Coria RN Entered By: Carlene Coria on 04/25/2022 12:34:00 -------------------------------------------------------------------------------- Compression Therapy Details Patient Name: Date of Service: Adam Merritt [facility] W. 04/25/2022 11:00 A M Medical Record Number: [id number] Patient Account Number: [id number] Date of Birth/Sex: Treating RN: 02-Mar-1946 (76 y.o. [name] Primary Care Adriaan Maltese: [name] Other Clinician: Referring Shayra Anton: Treating Conleigh Heinlein/Extender: Cephus Richer [name]: 1 Compression Therapy Performed for Wound Assessment: NonWound Condition Lymphedema - Left Leg Performed By: Clinician Carlene Coria, RN Compression Type: Four Layer Post Procedure Diagnosis Same as Pre-procedure Adam Merritt ([id number])  [id number] Page 3 of 6 Electronic Signature(s) Signed: 04/25/2022 12:34:19 PM By: Carlene Coria RN Entered By: Carlene Coria on 04/25/2022 12:34:18 -------------------------------------------------------------------------------- Encounter Discharge Information Details Patient Name: Date of Service: [place] W. 04/25/2022 11:00 A M Medical Record Number: [id number] Patient Account Number: [id number] Date of Birth/Sex: Treating RN: 13-Jun-1945 (76 y.o. Adam Merritt) Carlene Coria Primary Care Briyanna Billingham: [name] Other Clinician: Referring Eily Louvier: Treating Demontrez Rindfleisch/Extender: Cephus Richer [name]: 1 Encounter Discharge Information Items Discharge Condition: Stable Ambulatory Status: Wheelchair Discharge Destination: Home Transportation: Private Auto Accompanied By: wife Schedule Follow-up Appointment: Yes Clinical Summary of Care: Electronic Signature(s) Signed: 04/25/2022 12:35:48 PM By: Carlene Coria RN Entered By: Carlene Coria on 04/25/2022 12:35:48 -------------------------------------------------------------------------------- Lower Extremity Assessment Details Patient Name: Date of Service: Adam Merritt [facility] W. 04/25/2022 11:00 A M Medical Record Number: [id number] Patient Account Number: [id number] Date of Birth/Sex: Treating RN: Oct 29, 1945 (76 y.o. [name] Primary Care Esme Durkin: [name] Other Clinician: Referring Retha Bither: Treating Tasmine Hipwell/Extender: Cephus Richer [name]: 1 Edema Assessment Assessed: [Left: No] [Right: No] Edema: [Left: Yes] [Right: Yes] Calf Left: Right: Point of Measurement: 30 cm From Medial Instep 60 cm 53 cm Ankle Left: Right: Point of Measurement: 11 cm From Medial Instep 37 cm 32 cm Knee To Floor Left: Right: From Medial Instep 40 cm 40 cm Vascular Assessment Pulses: Dorsalis Pedis Palpable: [Left:Yes] [Right:Yes] Electronic Signature(s) Signed: 04/25/2022  11:28:40 AM By: Carlene Coria RN Entered By: Carlene Coria on 04/25/2022 11:28:39 Adam Merritt ([id number]) [id number] Page 4 of 6 -------------------------------------------------------------------------------- Multi Wound Chart Details Patient Name: Date of Service: Adam Merritt [facility] W. 04/25/2022 11:00 A M Medical Record Number: [id number] Patient Account Number: [id number] Date of Birth/Sex: Treating RN: [birth year] (76 y.o. Adam Merritt) Carlene Coria Primary Care Zakari Bathe: [name] Other Clinician: Referring Alixis Harmon: Treating Dmoni Fortson/Extender: Cephus Richer [name]: 1 Vital Signs Height(in): 66 Pulse(bpm): 88 Weight(lbs): 304 Blood Pressure(mmHg): 137/87 Body Mass Index(BMI): 49.1 Temperature(F): 98.5 Respiratory Rate(breaths/min): 18 [Treatment Notes:Wound Assessments Treatment Notes] Electronic Signature(s) Signed: 04/25/2022 11:29:02 AM  By: Carlene Coria RN Entered By: Carlene Coria on 04/25/2022 11:29:02 -------------------------------------------------------------------------------- [place] Details Patient Name: Date of Service: Adam Merritt [place] W. 04/25/2022 11:00 A M Medical Record Number: [id number] Patient Account Number: [id number] Date of Birth/Sex: Treating RN: [birth year] (76 y.o. Adam Merritt) Carlene Coria Primary Care Lilliahna Schubring: [name] Other Clinician: Referring Ebunoluwa Gernert: Treating Chanon Loney/Extender: Cephus Richer [name]: 1 Active Inactive Electronic Signature(s) Signed: 06/26/2022 10:48:17 AM By: Gretta Cool, BSN, RN, CWS, Kim RN, BSN Signed: 08/29/2022 8:47:03 AM By: Carlene Coria RN Previous Signature: 04/25/2022 11:28:50 AM Version By: Carlene Coria RN Entered By: Gretta Cool, BSN, RN, CWS, Kim on 06/26/2022 10:48:16 -------------------------------------------------------------------------------- Pain Assessment Details Patient Name: Date of Service: Adam Merritt W. 04/25/2022 11:00 A  M Medical Record Number: [id number] Patient Account Number: [id number] Date of Birth/Sex: Treating RN: May 06, 1946 (76 y.o. [name] Primary Care Angelyn Osterberg: [name] Other Clinician: Referring Millan Legan: Treating Sharetta Ricchio/Extender: Cephus Richer [name]: 1 Active Problems Location of Pain Severity and Description of Pain Patient Has Paino No Site Locations MARKIEL, BATTERSBY ([id number]) [id number] Page 5 of 6 Pain Management and Medication Current Pain Management: Electronic Signature(s) Signed: 04/25/2022 11:28:03 AM By: Carlene Coria RN Entered By: Carlene Coria on 04/25/2022 11:28:03 -------------------------------------------------------------------------------- Patient/Caregiver Education Details Patient Name: Date of Service: [name]. 11/21/2023andnbsp11:00 A M Medical Record Number: [id number] Patient Account Number: [id number] Date of Birth/Gender: Treating RN: [birth year] (76 y.o. Adam Merritt) Carlene Coria Primary Care Physician: [name] Other Clinician: Referring Physician: Treating Physician/Extender: Cephus Richer [name]: 1 Education Assessment Education Provided To: Patient Education Topics Provided Wound/Skin Impairment: Methods: Explain/Verbal Responses: State content correctly Electronic Signature(s) Signed: 05/04/2022 4:41:36 PM By: Carlene Coria RN Entered By: Carlene Coria on 04/25/2022 12:34:59 -------------------------------------------------------------------------------- Vitals Details Patient Name: Date of Service: Adam Merritt [facility] W. 04/25/2022 11:00 A M Medical Record Number: [id number] Patient Account Number: [id number] Date of Birth/Sex: Treating RN: Sep 09, 1945 (76 y.o. [name] Primary Care Zamari Bonsall: [name] Other Clinician: Referring Camryn Lampson: Treating Jeremiah Curci/Extender: Cephus Richer [name]: 1 Vital Signs Time Taken: 11:10 Temperature  (F): 98.5 Height (in): 66 Pulse (bpm): 88 Weight (lbs): 304 Respiratory Rate (breaths/min): 18 Body Mass Index (BMI): 49.1 Blood Pressure (mmHg): 137/87 Reference Range: 80 - 120 mg / dl HAIDAN, RIN ([id number]) [id number] Page 6 of 6 Electronic Signature(s) Signed: 04/25/2022 11:27:49 AM By: Carlene Coria RN Entered By: Carlene Coria on 04/25/2022 11:27:49

## 2022-04-25 NOTE — Progress Notes (Addendum)
Merritt, Adam ([id number]) [id number] Page 1 of 16 Visit Report for 04/25/2022 Chief Complaint Document Details Patient Name: Date of Service: Adam Merritt [facility] W. 04/25/2022 11:00 A M Medical Record Number: [id number] Patient Account Number: [id number] Date of Birth/Sex: Treating RN: July 08, 1945 (76 y.o. Adam Merritt) Carlene Coria Primary Care Provider: Jonetta Osgood Other Clinician: Referring Provider: Treating Provider/Extender: Cephus Richer [name]: 1 Information Obtained from: Patient Chief Complaint Bilateral LE edema and weeping ulcers Electronic Signature(s) Signed: 04/25/2022 11:09:13 AM By: Worthy Keeler PA-C Entered By: Worthy Keeler on 04/25/2022 11:09:13 -------------------------------------------------------------------------------- HPI Details Patient Name: Date of Service: Adam Merritt W. 04/25/2022 11:00 A M Medical Record Number: [id number] Patient Account Number: [id number] Date of Birth/Sex: Treating RN: August 12, 1945 (76 y.o. Adam Merritt Primary Care Provider: Jonetta Osgood Other Clinician: Referring Provider: Treating Provider/Extender: Cephus Richer [name]: 1 History of Present Illness HPI Description: 10/18/17-He is here for initial evaluation of bilateral lower extremity ulcerations in the presence of venous insufficiency and lymphedema. He has been seen by vascular medicine in the past, Dr. Lucky Cowboy, last seen in 2016. He does have a history of abnormal ABIs, which is to be expected given his lymphedema and venous insufficiency. According to Epic, it appears that all attempts for arterial evaluation and/or angiography were not follow through with by patient. He does have a history of being seen in lymphedema clinic in 2018, stopped going approximately 6 months ago stating "it didn't do any good". He does not have lymphedema pumps, he does not have custom fit compression wrap/stockings. He is  diabetic and his recent A1c last month was 7.6. He admits to chronic bilateral lower extremity pain, no change in pain since blister and ulceration development. He is currently being treated with Levaquin for bronchitis. He has home health and we will continue. 10/25/17-He is here in follow-up evaluation for bilateral lower extremity ulcerationssubtle he remains on Levaquin for bronchitis. Right lower extremity with no evidence of drainage or ulceration, persistent left lower extremity ulceration. He states that home health has not been out since his appointment. He went to [facility] on Tuesday, studies revealed: RIGHT ABI 0.9, TBI 0.6 LEFT ABI 1.1, TBI 0.6 with triphasic flow bilaterally. We will continue his same treatment plan. He has been educated on compression therapy and need for elevation. He will benefit from lymphedema pumps 11/01/17-He is here in follow-up evaluation for left lower extremity ulcer. The right lower extremity remains healed. He has home health services, but they have not been out to see the patient for 2-3 weeks. He states it home health physical therapy changed his dressing yesterday after therapy; he placed Ace wrap compression. We are still waiting for lymphedema pumps, reordered d/t need for company change. 11/08/17-He is here in follow-up evaluation for left lower extremity ulcer. It is improved. Edema is significantly improved with compression therapy. We will continue with same treatment plan and he will follow-up next week. No word regarding lymphedema pumps 11/15/17-He is here in follow-up evaluation for left lower extremity ulcer. He is healed and will be discharged from wound care services. I have reached out to JAKI, HAMMERSCHMIDT ([id number]) [id number] Page 2 of 16 [facility] regarding his lymphedema pumps. They have been unable to reach the patient; the contact number they had with the patient's wife's cell phone and  she has not answered any unrecognized calls. Contact should be made today, trial planned for next week;  [facility] will continue to follow 11/27/17 on evaluation today patient has multiple blistered areas over the right lower extremity his left lower extremity appears to be doing okay. These blistered areas show signs of no infection which is great news. With that being said he did have some necrotic skin overlying which was mechanically debrided away with saline and gauze today without complication. Overall post debridement the wounds appear to be doing better but in general his swelling seems to be increased. This is obviously not good news. I think this is what has given rise to the blisters. 12/04/17 on evaluation today patient presents for follow-up concerning his bilateral lower extremity edema in the right lower extremity ulcers. He has been tolerating the dressing changes without complication. With that being said he has had no real issues with the wraps which is also good news. Overall I'm pleased with the progress he's been making. 12/11/17 on evaluation today patient appears to be doing rather well in regard to his right lateral lower extremity ulcer. He's been tolerating the dressing changes without complication. Fortunately there does not appear to be any evidence of infection at this time. Overall I'm pleased with the progress that is being made. Unfortunately he has been in the hospital due to having what sounds to be a stomach virus/flu fortunately that is starting to get better. 12/18/17 on evaluation today patient actually appears to be doing very well in regard to his bilateral lower extremities the swelling is under fairly good control his lymphedema pumps are still not up and running quite yet. With that being said he does have several areas of opening noted as far as wounds are concerned mainly over the left lower extremity. With that being said I do believe once he gets  lymphedema pumps this would least help you mention some the fluid and preventing this from occurring. Hopefully that will be set up soon sleeves are Artie in place at his home he just waiting for the machine. 12/25/17 on evaluation today patient actually appears to be doing excellent in fact all of his ulcers appear to have resolved his legs appear very well. I do think he needs compression stockings we have discussed this and they are actually going to go to [place] today to elastic therapy to get this fitted for him. I think that is definitely a good thing to do. Readmission: 04/09/18 upon evaluation today this patient is seen for readmission due to bilateral lower extremity lymphedema. He has significant swelling of his extremities especially on the left although the right is also swollen he has weeping from both sides. There are no obvious open wounds at this point. Fortunately he has been doing fairly well for quite a bit of time since I last saw him. Nonetheless unfortunately this seems to have reopened and is giving quite a bit of trouble. He states this began about a week ago when he first called [place] to get in to be seen. No fevers, chills, nausea, or vomiting noted at this time. He has not been using his lymphedema pumps due to the fact that they won't fit on his leg at this point likewise is also not been using his compression for essentially the same reason. 04/16/18 upon evaluation today patient actually appears to be doing a little better in regard to the fluid in his bilateral lower extremities. With that being said he's had three falls since I saw him last week. He also states that he's been feeling very poorly. I  was concerned last week and feel like that the concern is still there as far as the congestion in his chest is concerned he seems to be breathing about the same as last week but again he states he's very weak he's not even able to walk further than from the chair to the door.  His wife had to buy a wheelchair just to be able to get them out of the house to get to the appointment today. This has me very concerned. 04/23/18 on evaluation today patient actually appears to be doing much better than last week's evaluation. At that time actually had to transport him to the ER via EMS and he subsequently was admitted for acute pulmonary edema, acute renal failure, and acute congestive heart failure. Fortunately he is doing much better. Apparently they did dialyze him and were able to take off roughly 35 pounds of fluid. Nonetheless he is feeling much better both in regard to his breathing and he's able to get around much better at this time compared to previous. Overall I'm very happy with how things are at this time. There does not appear to be any evidence of infection currently. No fevers, chills, nausea, or vomiting noted at this time. 04/30/2018 patient seen today for follow-up and management of bilateral lower extremity lymphedema. He did express being more sad today than usual due to the recent loss of his dog. He states that he has been compliant with using the lymphedema pumps. However he does admit a minute over the last 2-3 days he has not been using the pumps due to the recent loss of his dog. At this time there is no drainage or open wounds to his lower extremities. The left leg edema is measuring smaller today. Still has a significant amount of edema on bilateral lower extremities With dry flaky skin. He will be referred to the lymphedema clinic for further management. Will continue 3 layer compression wraps and follow-up in 1-2 weeks.Denies any pain, fever, chairs recently. No recent falls or injuries reported during this visit. 05/07/18 on evaluation today patient actually appears to be doing very well in regard to his lower extremities in general all things considered. With that being said he is having some pain in the legs just due to the amount of swelling. He  does have an area where he had a blister on the left lateral lower extremity this is open at this point other than that there's nothing else weeping at this time. 05/14/18 on evaluation today patient actually appears to be doing excellent all things considered in regard to his lower extremities. He still has a couple areas of weeping on each leg which has continued to be the issue for him. He does have an appointment with the lymphedema clinic although this isn't until February 2020. That was the earliest they had. In the meantime he has continued to tolerate the compression wraps without complication. 05/28/18 on evaluation today patient actually appears to be doing more poorly in regard to his left lower extremity where he has a wound open at this point. He also had a fall where he subsequently injured his right great toe which has led to an open wound at the site unfortunately. He has been tolerating the dressing changes without complication in general as far as the wraps are concerned that he has not been putting any dressing on the left 1st toe ulcer site. 06/11/18 on evaluation today patient appears to be doing much worse in regard to  his bilateral lower extremity ulcers. He has been tolerating the dressing changes without complication although his legs have not been wrapped more recently. Overall I am not very pleased with the way his legs appear. I do believe he needs to be back in compression wraps he still has not received his compression wraps from the [facility] hospital as of yet. 06/18/18 on evaluation today patient actually appears to be doing significantly better than last time I saw him. He has been tolerating the compression wraps without complication in the circumferential ulcers especially appear to be doing much better. His toe ulcer on the right in regard to the great toe is better although not as good as the legs in my pinion. No fevers chills noted 07/02/18 on evaluation today patient  appears to be doing much better in regard to his lower extremity ulcers. Unfortunately since I last saw him he's had the distal portion of his right great toe if he dated it sounds as if this actually went downhill very quickly. I had only seen him a few days prior and the toe did not appear to be infected at that point subsequently became infected very rapidly and it was decided by the surgeon that the distal portion of the toe needed to be removed. The patient seems to be doing well in this regard he tells me. With that being said his lower extremities are doing better from the standpoint of the wounds although he is significantly swollen at this point. 07/09/18 on evaluation today patient appears to be doing better in regard to the wounds on his lower extremities. In fact everything is almost completely healed he is just a small area on the left posterior lower extremity that is open at this point. 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 With that being said he needs some kind of compression it does sound like he may not be wearing his compression, that is the wraps, during the entire time between when he's here visit to visit. Apparently his wife took the current one off because it began to "fall apart". 07/16/18 on evaluation today patient appears to be extremely swollen especially in regard to his left lower extremity unfortunately. He also has a new skin tear over the left lower extremity and there's a smaller area on the right lower extremity as well. Unfortunately this seems to be due in part to blistering and fluid buildup in his leg. He did get the reduction wraps that were ordered by the [facility] hospital for him to go to lymphedema clinic. With that being said his wounds on the legs have not healed to the point to  where they would likely accept them as a patient lymphedema clinic currently. We need to try to get this to heal. With that being said he's been taking his wraps off which is not doing him any favors at this point. In fact this is probably quite counterproductive compared to what needs to occur. We will likely need to increase to a four layer compression wrap and continue to also utilize elevation and he has to keep the wraps on not take them off as he's been doing currently hasn't had a wrap on since Saturday. VARICK, KEYS ([id number]) [id number] Page 3 of 16 07/23/18 on evaluation today patient appears to be doing much better in  regard to his bilateral lower extremities. In fact his left lower extremity which was the largest is actually 15 cm smaller today compared to what it was last time he was here in our clinic. This is obviously good news after just one week. Nonetheless the differences he actually kept the wraps on during the entire week this time. That's not typical for him. I do believe he understands a little bit better now the severity of the situation and why it's important for him to keep these wraps on. 07/30/18 on evaluation today patient actually appears to be doing rather well in regard to his lower extremities. His legs are much smaller than they have been in the past and he actually has only one very small rudder superficial region remaining that is not closed on the left lateral/posterior lower extremity even this is almost completely close. I do believe likely next week he will be healed without any complications. I do think we need to continue the wraps however this seems to be beneficial for him. I also think it may be a good time for [place] to go ahead and see about getting the appointment with the lymphedema clinic which is supposed to be made for him in order to keep this moving along and hopefully get them into compression wraps that will in the end  help him to remain healed. 08/06/18 on evaluation today patient actually appears to be doing very well in regard as bilateral lower extremities. In fact his wounds appear to be completely healed at this time. 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 He is in the process of getting appointment with the lymphedema clinic we have made this referral were just waiting to hear back on the schedule time. We need to follow up on that today as well. 08/13/18 on evaluation today patient actually appears to be doing very well in regard to his bilateral lower extremities there are no open wounds at this point. We are gonna go ahead and see about ordering the Velcro compression wraps for him had a discussion with them about [place] doing it versus the [place] they feel like they can definitely afford going ahead and get the wraps themselves and they would prefer to try to avoid having to go to the lymphedema clinic if it all possible which I completely understand. As long as he has good compression I'm okay either way. 3/17//20 on evaluation today patient actually appears to be doing well in regard to his bilateral lower extremity ulcers. He has been tolerating the dressing changes without complication specifically the compression wraps. Overall is had no issues my fingers finding that I see at this point is that he is having trouble with constipation. He tells me he has not been able to go to the bathroom for about six days. He's taken to over-the-counter oral laxatives unfortunately this is not helping. He has not contacted his doctor. 08/27/18 on evaluation today patient appears to be doing fairly well in regard to his lower extremities at this point. There does not appear to be any new altars is swelling is very well controlled. We are still waiting for his Velcro compression wraps to arrive that should be sometime in the next week is Artie sent the check for  these. 09/03/18 on evaluation today patient appears to be doing excellent in regard to his bilateral lower extremities which he shows no signs of wound openings in regard to this point. He  does have his Velcro compression wraps which did arrive in the mail since I saw him last week. Overall he is doing excellent in my pinion. 09/13/18 on evaluation today patient appears to be doing very well currently in regard to the overall appearance of his bilateral lower extremities although he's a little bit more swollen than last time we saw him. At that point he been discharged without any open wounds. Nonetheless he has a small open wound on the posterior left lower extremity with some evidence of cellulitis noted as well. Fortunately I feel like he has made good progress overall with regard to his lower extremities from were things used to be. 09/20/18 on evaluation today patient actually appears to be doing much better. The erythematous lower extremity is improving wound itself which is still open appears to be doing much better as far as but appearance as well as pain is concerned overall very pleased in this regard. There's no signs of active infection at this time. 09/27/18 on evaluation today patient's wounds on the lower extremity actually appear to be doing fairly well at this time which is good news. There is no evidence of active infection currently and again is just as left lower extremity were there any wounds at this point anyway. I believe they may be completely healed but again I'm not 100% sure based on evaluation today. I think one more week of observation would likely be a good idea. 10/04/18 on evaluation today patient actually appears to be doing excellent in regard to the left lower extremity which actually appears to be completely healed as of today. Unfortunately he's been having issues with his right lower extremity have a new wound that has opened. Fortunately there's no evidence of  active infection at this time which is good news. 10/10/18 upon evaluation today patient actually appears to be doing a little bit worse with the new open area on his right posterior lower extremity. He's been tolerating the dressing changes without complication. Right now we been using his compression wraps although I think we may need to switch back to actually performing bilateral compression wraps are in the clinic. No fevers, chills, nausea, or vomiting noted at this time. 10/17/18 on evaluation today patient actually appears to be doing quite well in regard to his bilateral lower extremity ulcers. He has been tolerating the reps without complication although he would prefer not to rewrap his legs as of today. Fortunately there's no signs of active infection which is good news. No fevers, chills, nausea, or vomiting noted at this time. 10/24/18 on evaluation today patient appears to be doing very well in regard to his lower extremities. His right lower extremity is shown signs of healing and his left lower Trinity though not healed appears to be improving which is excellent news. Overall very pleased with how things seem to be progressing at this time. The patient is likewise happy to hear this. His Velcro compression wraps however have not been put on properly will gonna show his wife how to do that properly today. 10/31/18 on evaluation today patient appears to be doing more poorly in regard to his left lower extremity in particular although both lower extremities actually are showing some signs of being worse than my previous evaluation. Unfortunately I'm just not sure that his compression stockings even with the use of the compression/lymphedema pumps seem to be controlling this well. Upon further questioning he tells me that he also is not able to lie flat in  the bed due to his congestive heart failure and difficulty breathing. For that reason he sleeps in his recliner. T make matters worse his  recliner also cannot even hold his legs up o so instead of even being somewhat elevated they pretty much hang down to the floor. This is the way he sleeps each night which is definitely counterproductive to everything else that were attempting to do from the standpoint of controlling his fluid. Nonetheless I think that he potentially could benefit from a hospital bed although this would be something that his primary care provider would likely have to order since anything that is order on our side has to be directly related to wound care and again the hospital bed is not necessarily a direct relation to although I think it does contribute to his overall wound status on his lower extremities. 11/07/18 on evaluation today patient appears to be doing better in regard to his bilateral lower extremity. He's been tolerating the dressing changes without complication. We did in the interim since I last saw him switch to just using extras orbiting new alginate and that seems to have done much better for him. I'm very pleased with the overall progress that is made. 11/14/18 on evaluation today patient appears to be redoing rather well in regard to his left lower extremity ulcers which are the only ones remaining at this point. Fortunately there's no signs of active infection at this time which is good news. Overall been very pleased with how things seem to be progressing currently. No fevers, chills, nausea, or vomiting noted at this time. 11/20/17 on evaluation today patient actually appears to be doing quite well in regard to his lower extremities on the left on the right he has several blisters that showed up although there's some question about whether or not he has had his broker compression wraps on like he was supposed to or not. Fortunately there's no signs of active infection at this time which is good news. Unfortunately though he is doing well from the standpoint of the left leg the right leg is  not doing as well again this is when we did not wrap last week. 11/28/18 on evaluation today patient appears to be doing well in regard to his bilateral lower extremities is left appears to be healed is right is not healed but is very close to being so. Overall very pleased with how things seem to be progressing. Patient is likewise happy that things are doing well. CRAIG, IONESCU ([id number]) [id number] Page 4 of 16 12/09/18 on evaluation today patient actually appears to be completely healed which is excellent news. He actually seems to be doing well in regard to the swelling of the bilateral lower extremities which is also great news. Overall very pleased with how things seem to be progressing. Readmission: 03/18/2019 upon evaluation today patient presents for reevaluation concerning issues that he is having with his left lower extremity where he does have a wound noted upon inspection today. He also has a wound on the right second toe on the tip where it is apparently been rubbing in his shoe I did look at issues and you can actually see where this has been occurring as well. Fortunately there is no signs of infection with regard to the toe necessarily although it is very swollen compared to normal that does have me somewhat concerned about the possibility of further evaluation for infection/osteomyelitis. I would recommend an x-ray to start with. Otherwise he states that it  is been 1-2 weeks that he has had the draining in regard to left lower extremity he does not know how this happened he has been wearing his compression appropriately and I do see that as well today but nonetheless I do think that he needs to continue to be very cautious with regard to elevation as well. 03/25/2019 on evaluation today patient appears to be doing much better in regard to his lower extremity at this time. That is on the left. He did culture positive for Pseudomonas but the good news is  he seems to be doing much better the gentamicin we applied topically seems to have done a great job for him. There is no signs of active infection at this time systemically and locally he is doing much better. No fevers, chills, nausea, vomiting, or diarrhea. In regard to his right toe this seems to be doing much better there is very little pressure noted at this point I did clean up some of the callus today around the edges of the wound as well as the surface of the wound but to be honest he is progressing quite nicely. 10/30; the area on the left anterior lower tibia area is healed over. His edema control is adequate even though his use of the compression pumps seems very intermittent. He has a juxta lite stocking on the right leg and a think there is 1 for the left leg at home. His wife is putting these on. He has an area on the plantar right second toe which is a hammertoe they are trying to offload this 04/08/2019 on evaluation today patient appears to be doing well in regard to his lower extremities which is good news. We are seeing him today for his toe ulcer which is giving him a little bit more trouble but again still seems to be doing better at this point which is good news. He is going require some sharp debridement in regard to the toe today however. 04/15/2019 on evaluation today patient actually appears to be doing quite well with regard to his toe ulcer. I am very pleased with how things are doing in that regard. With regard to his lower extremity edema in general he tells me he is not been using the lymphedema pumps regularly although he does not use them. I think that he needs to use them more regularly he does have a small blister on the left lower extremity this is not open at this point but obviously it something that can get worse if he does not keep the compression going and the pumps going as well. 04/22/2019 on evaluation today patient appears to be doing well with regard to his  toe ulcer. He has been tolerating the dressing changes without complication. This is measuring slightly smaller this week as compared to last week. Fortunately there is no signs of active infection at this time. 05/06/2019 on evaluation today patient actually appears to be doing excellent. In fact his toe appears to be completely healed which is great news. There is no signs of active infection at this time. Readmission: Patient presents today for follow-up after having been discharged at the beginning of December 2020. He states that in recent weeks things have reopened and has been having more trouble at this point. With that being said fortunately there is no signs of active infection at this time. No fever chills noted. Nonetheless he also does have an issue with one of his toes as well that seems to be  somewhat this is on the right foot second toe. 07/25/2019 upon evaluation today patient's lower extremities appear to be doing excellent bilaterally. I really feel like he is showing signs of great improvement and in fact I think he is close to healing which is great news. There is no signs of active infection at this time. No fevers, chills, nausea, vomiting, or diarrhea. 07/31/2019 upon evaluation today patient appears to be doing excellent and in fact I think may be completely healed in regard to his right lower extremity that were still to monitor this 1 more week before closing it out. The left lower extremity is measuring smaller although not completely closed seems to be doing excellent. 08/07/2019 upon evaluation today patient appears to be doing excellent in regard to his wounds. In fact the right lower extremity is completely healed there is no issues here. The left lower extremity has 1 very small area still remaining fortunately there is no signs of infection and overall I think he is very close to closure of the here as well. 08/14/19 upon evaluation today patient appears to be doing  excellent in regard to his lower extremities. In fact he appears to be completely healed as of today. Fortunately there's no signs of active infection at this time. No fevers, chills, nausea, or vomiting noted at this time. READMISSION 09/26/2019 This is a 76 year old man who is well-known to this clinic having just been discharged on 08/14/2019. He has known lymphedema and chronic venous insufficiency. He has Farrow wrap stockings and also external compression pumps. He does not use the external compression pumps however. He does however apparently fairly faithfully use the stockings. They have not been lotion in his legs. He has developed new wounds on the right anterior as well as the left medial left lateral and left posterior calf. He returns for our review of this Past medical history includes coronary artery disease, hypertension, congestive heart failure, COPD, venous insufficiency with lymphedema, type 2 diabetes. He apparently has compression pumps, Farrow wraps and at 1 point a hospital bed although I believe he sleeps in a recliner 10/03/2019 upon evaluation today patient appears to be doing better with regard to his wounds in general. He has been tolerating the dressing changes without complication. Fortunately there is no signs of active infection. No fevers, chills, nausea, vomiting, or diarrhea. I do believe the compression wraps are helping as they always have in the past. 10/10/2019 upon evaluation today patient appears to be doing excellent at this point. His right lower extremity is measuring much better and in fact is completely healed. His left lower extremity is also measuring better though not healed seems to be doing excellent at this point which is great news. Overall I am very pleased with how things appear currently. 10/27/2019 upon evaluation today patient appears to be doing quite well with regard to his wounds currently. He has been tolerating the dressing changes  without complication. Fortunately there is no signs of active infection at this time. In fact he is almost completely healed on the left and has just a very small area open and on the right he is completely healed. 11/04/2019 upon evaluation today patient appears to be doing quite well with regard to his wounds. In fact he appears to be quite possibly completely healed on the left lower extremity now as well the right lower extremity is still doing well. With that being said unfortunately though this may be healed I think it is a very fragile  area and I cannot even confirm there is not a very small opening still remaining. I really think he may benefit from 1 additional weeks wrapping before discontinuing. 11/10/2019 upon evaluation today patient appears to be doing well in regard to his original wounds in fact everything that we were treating last week is completely healed on the left. Unfortunately he has a new skin tear just above where the wrap slipped down due to a fall he sustained causing this injury. 11/17/2019 on evaluation today patient appears to be doing better with regard to his wound. He has been tolerating the dressing changes without complication. Fortunately there is no signs of active infection at this time. No fevers, chills, nausea, vomiting, or diarrhea. MUHSIN, DORIS ([id number]) [id number] Page 5 of 16 11/24/2019 upon evaluation today patient appears to be doing well with regard to his wound. This is making good progress there does not appear to be signs of active infection but overall I do feel like he is headed in the correct direction. Overall he is tolerating the compression wrap as well. 12/02/2019 upon evaluation today patient appears to be doing well with regard to his leg ulcer. In fact this appears to be completely healed and this is the last of the wounds that he had but still the left anterior lower extremity. Fortunately there is no signs of  active infection at this time. No fevers, chills, nausea, vomiting, or diarrhea. Readmission: 02/05/2020 on evaluation today patient appears to be doing well with regard to his wound all things considered. He actually tells me that a couple weeks ago he had trouble with his wraps and therefore took him off for a couple of days in order to give his legs a break. It was during this time that he actually developed increased swelling and edema of his legs and blisters on both legs. That is when he called to make the appointment. Nonetheless since that time he began wearing his compression wraps which are Velcro in the meantime and has done extremely well with this. In fact his leg appears to be under good control as far as edema is concerned today it is nothing like what I am seeing when he has come in for evaluations in the past. They have been using some of the silver alginate at home which has been beneficial for him. Fortunately there is no signs of active infection at this time. No fevers, chills, nausea, vomiting, or diarrhea. 02/19/2020 on evaluation today patient unfortunately does not appear to be doing quite as well as I would like to see. He has no signs of active infection at this time but unfortunately he is not doing well in regard to the overall appearance of his left leg. He has a couple other areas that are weeping now and the leg is much more swollen. I think that he needs to actually have a compression wrap to try to manage this. 02/26/2020 on evaluation today patient appears to be doing well with regard to his left lower extremity ulcer. Fortunately the swelling is down I do believe the pressure wrap seems to be doing quite well. Fortunately there is no signs of active infection at this time. 03/05/2020 upon evaluation today patient appears to be doing excellent in regard to his legs at this point. Fortunately there is no signs of active infection which is great news. Overall he feels like  he is completely healed which is great news. Readmission: 05/20/2020 upon evaluation today patient appears to be  doing well with regard to his leg ulcer all things considered. He is actually coming back to see [place] after having had a reopening of the ulcers on his left leg in the past several weeks. He is out of dressing supplies and his wife is been try to take care of this as best she can. 06/10/2020 upon evaluation today patient unfortunately appears to be doing significantly worse today compared to when I last saw him. He and his wife both tell me that "there has been a lot going on". I did not actually go into detail about everything that has been happening but nonetheless he has not obviously been wearing his compression. He brings them with him today but they were not on. I am not even certain to be honest that he could get those on at this point. Nonetheless I do believe that he is going to require compression wrapping at some point shortly but right now we need to try to get what appears to be infection under control at this time. 06/28/2020 upon evaluation today patient appears to be doing well with regard to 06/28/2020 upon evaluation today patient appears to be doing well pretty well in regard to his left leg which is much better than previous. With that being said in regard to his right leg he does seem to be having some issues with a new wound after he sustained a fall. This fortunately is not too deep but does appear to be something we need to address. Neither deep which is good. 07/05/2020 on evaluation today patient appears to be doing well with regard to his wounds. In fact everything on the left leg appears to be pretty much healed on the right leg this is very close though not completely closed yet. Fortunately there is no evidence of active infection at this time. No fevers, chills, nausea, vomiting, or diarrhea. 07/13/2019 upon evaluation today patient appears to be making good progress  which is great news. Overall I am extremely pleased with where things stand today. There does not appear to be any signs of active infection which is great news and overall his left leg appears healed still the right leg though not healed does seem to be making good progress which is excellent news. He is having no pain. 07/19/2020 on evaluation today patient appears to be doing well with regard to his wound. He has been tolerating the dressing changes without complication. Fortunately there does not appear to be any signs of active infection at this time. No fevers, chills, nausea, vomiting, or diarrhea. Readmission: 08/20/2020 on evaluation today patient presents for reevaluation here in the clinic concerning issues that he has been having with his bilateral lower extremities. Unfortunately this is an ongoing reoccurring issue. He tells me that he is really not certain exactly what caused the issue although as I discussed this further with him it appears that he has been sitting for the most part and sleeping in his chair with his feet on the ground he is not really elevating at all in fact the chair does not even have a reclining function therefore it is basically just him sitting with his feet on the ground. I think this alone has probably led to his increased swelling he is also having more difficulty walking which means that he is pumping less blood flow through his legs anyway as far as the venous stasis is concerned. All in all I think this is leading to a downward spiral of him not  being able to walk very well as his legs are so heavy, they begin to weep, and overall he just does not seem to be doing as well as it was when I last saw him. 08/27/2020 upon evaluation today patient's legs actually are showing signs of improvement which is great news. There does not appear to be any evidence of infection which is also excellent news. I am extremely pleased with where things stand today. No fevers,  chills, nausea, vomiting, or diarrhea. 09/03/20 upon evaluation today patient appears to be doing excellent in regard to his wounds currently. He has been tolerating the dressing changes without complication and overall I am extremely pleased with where things stand today. No fevers, chills, nausea, vomiting, or diarrhea. Overall he is healed on the right leg although the left leg not being completely healed still is doing significantly better. 09/10/2020/8/22 upon evaluation today patient appears to be doing well with regard to his wounds. He has been tolerating the dressing changes without complication. Fortunately he appears to be completely healed. I am very pleased in that regard. As is the patient his right leg is also doing well he did get a new recliner which is helping him keep his feet off the ground which I think is helped he is down 2 cm on the left compared to last week so I think there is some definite improvement here. Readmission 12/15/20: Mr. Jovani Flury is a 77 year old male with a past medical history of lymphedema, insulin-dependent type 2 diabetes, COPD and congestive heart failure that presents to the clinic for increased drainage from his legs bilaterally, Left greater than right. Patient has lymphedema. He has been seen in our clinic on multiple occasions for this issue. He currently denies signs of infection. He has not been using his lymphedema pumps. 12/23/2020 upon evaluation today patient appears to be doing well with regard to his wounds. He has been tolerating the dressing changes he really does not have any open wounds just weeping areas of the legs bilaterally and fortunately seems to be doing better at this point. 12/28/2020 upon evaluation today patient's wound is actually showing signs of good improvement. There does not appear to be any evidence of infection which is great news and overall I am extremely pleased at this point. I think is very close to complete  resolution. 01/04/2021 upon evaluation today patient actually appears to be completely healed which is great news. I do not see any signs of active infection currently which is also great news. In general I am extremely pleased with where we stand and I do believe that the patient is tolerating the dressing changes without complication. I believe he is ready to go back into his Velcro compression and I did see that they brought them with him today. JAHMEIR, GEISEN ([id number]) [id number] Page 6 of 16 Readmission: Patient presents today for follow-up he was last seen August 2. He is having some issues with some mild drainage and leaking from the left leg. This is something that has reopened and again is a recurrent issue for him. Subsequently he does wear his Velcro compression wraps though I think we may want to look into something a little bit better for him I am thinking that we may want to try the juxta fit compression which I think would do much more effective compression treatment overall for him. 04/08/2021 upon evaluation today patient appears to be doing well with regard to his legs. I do feel like he  is showing signs of improvement which is great news. No fevers, chills, nausea, vomiting, or diarrhea. 04/15/2021 upon evaluation today patient's wounds actually appear to be very close to complete resolution. He is legs are actually doing well left leg just has a small area of leaking at this point. Overall I think that we are very close to complete resolution based on what I am seeing. Nonetheless he seems to not be feeling too well today I really do not know what exactly is going on here. 04/22/2021 upon evaluation patient appears to be completely healed based on what I see today. I do not see anything open both legs appear to be doing quite well. 05/20/2021 upon evaluation today patient appears to be doing poorly in regard to his legs. He has not been a full month  since we last saw him and he has reopened unfortunately. He does not appear to be any signs of active infection at this time which is great news. No fevers, chills, nausea, vomiting, or diarrhea. 12/27; the patient comes in the clinic today with severe bilateral lymphedema very dry skin but paradoxically his wounds are actually healed or should I say not open. He has compression pumps that he does not use. [Once per week] he does not have compression stockings. He comes in today with a small presumably pressure ulcer on the lateral plantar heel on the right. The patient states he has been complaining about pain in this area for about 2 months although no wound was recognized until our intake nurse was doing her exam 06/16/2021 upon evaluation today patient appears to be doing better in regard to his wounds. He is not completely healed yet especially in regard to the right heel which is still open. Nonetheless I do think that his left leg is almost completely healed the right leg appears to be doing decently well his right heel is what still mainly open currently. 06/30/2021 upon evaluation today patient appears to be doing well with regard to his wound. Has been tolerating the dressing changes without complication. Fortunately I think his legs are doing quite well the heel is also doing better. In general I am very pleased with where we stand today. 07/14/2021 upon evaluation today patient appears to be doing well with regard to his heel ulcer. Fortunately I think that it is showing some signs of improvement unfortunately there is a little bit of skin overlying the wound surface which is actually causing this to breakdown a little bit to be honest. It looks a little macerated. Nonetheless we need to clean this away. 07/28/2021 upon evaluation today patient appears to be doing well currently in regard to his heel ulcer which is almost completely closed and this is great news. Lets about the end of where  the great news stands. In regard to his legs unfortunately both legs are weeping at this point there really are not any true open wounds. Nonetheless I do believe that working half the compression wrap him in order to get this under control. I discussed that with the patient and his wife today. 08/04/2021 upon evaluation today patient appears to be doing well currently in regard to his legs. Overall things are doing quite nicely. I do not see any evidence of active infection locally or systemically at this time which is great news and overall I am extremely pleased with where we stand today. No fevers, chills, nausea, vomiting, or diarrhea. 3/9; patient presents for follow-up. He has been using his lymphedema  pumps twice daily with improvement in lower extremity swelling. He has no issues or complaints today. He denies signs of infection. 08/18/2021 upon evaluation patient appears to be doing excellent at this point today. In fact he really does not have any open wounds whatsoever which is great news. Honestly I do think that the patient is tolerating the compression wraps quite nicely and his legs are significantly smaller he is also using his lymphedema pumps and he is able to lift his legs. He is walking better and getting up and down better everything about what is going on at this point seems to be doing excellent and very pleased with how the patient is doing currently. 08/25/2021 upon evaluation today patient appears to be doing very well in regard to his wound. In fact everything on his heel as well as his legs are showing signs of good improvement. Everything is closed up at this point. Readmission: 04-18-2022 upon evaluation today patient appears to be doing poorly in regard to his bilateral lower extremities. Has been tolerating the dressing changes with his wife at home she has been putting on what she could do to try to help protect the area she tells me a lot of times she has been using  lotion as well as the Farrow 4000 wraps which to be honest are really not the best thing for him. They do not fit his legs fully and do not get help keep it compressed appropriately. Fortunately there does not appear to be any signs of infection at this time which is good news. Nonetheless I do believe we need to get his swelling under control. 04-25-2022 upon evaluation today patient appears to be doing well currently in regard to his legs I do feel like he is measuring better and smaller which is good news. With that being said there is still definitely some ways to go before we get this to the point we wanted to be as far as completely being healed is concerned. Electronic Signature(s) Signed: 04/25/2022 1:09:10 PM By: Worthy Keeler PA-C Entered By: Worthy Keeler on 04/25/2022 13:09:09 RONN, SMOLINSKY ([id number]) [id number] Page 7 of 16 -------------------------------------------------------------------------------- Physical Exam Details Patient Name: Date of Service: Adam Merritt. 04/25/2022 11:00 A M Medical Record Number: [id number] Patient Account Number: [id number] Date of Birth/Sex: Treating RN: 1946/05/22 (76 y.o. Adam Merritt) Carlene Coria Primary Care Provider: Jonetta Osgood Other Clinician: Referring Provider: Treating Provider/Extender: Cephus Richer [name]: 1 Constitutional Well-nourished and well-hydrated in no acute distress. Respiratory normal breathing without difficulty. Psychiatric this patient is able to make decisions and demonstrates good insight into disease process. Alert and Oriented x 3. pleasant and cooperative. Notes Upon inspection patient's wound bed actually showed signs of good granulation epithelization at this point. Fortunately I do not see any signs of worsening which is great and overall I am extremely happy with where we stand at this time. I think he is very close to complete resolution as far as  the weeping is concerned and then once we get that under control the patient can then subsequently proceed with the Velcro compression wraps. Electronic Signature(s) Signed: 04/25/2022 1:09:42 PM By: Worthy Keeler PA-C Entered By: Worthy Keeler on 04/25/2022 13:09:42 -------------------------------------------------------------------------------- Physician Orders Details Patient Name: Date of Service: Adam Merritt W. 04/25/2022 11:00 A M Medical Record Number: [id number] Patient Account Number: [id number] Date of Birth/Sex: Treating RN: 1946-02-17 (76 y.o. 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 [place] for wound care. May utilize formulary equivalent dressing for wound treatment orders unless otherwise specified. Home Health Nurse may visit PRN to address patients wound care needs. Marta Lamas [phone] Bathing/ Shower/ Hygiene May shower; gently cleanse wound with antibacterial soap, rinse and pat dry prior to dressing wounds Edema Control - Lymphedema / Segmental Compressive Device / Other Optional: One layer of unna paste to top of compression wrap (to act as an anchor). 4 Layer Compression System Lymphedema. Elevate, Exercise Daily and Avoid Standing for Long Periods of Time. FINIS, HENDRICKSEN ([id number]) [id number] Page 8 of 16 Elevate  legs to the level of the heart and pump ankles as often as possible Elevate leg(s) parallel to the floor when sitting. Compression Pump: Use compression pump on left lower extremity for 60 minutes, twice daily. Compression Pump: Use compression pump on right lower extremity for 60 minutes, twice daily. Electronic Signature(s) Signed: 04/25/2022 12:33:40 PM By: Carlene Coria RN Signed: 04/25/2022 5:41:33 PM By: Worthy Keeler PA-C Entered By: Carlene Coria on 04/25/2022 12:33:39 -------------------------------------------------------------------------------- Problem List Details Patient Name: Date of Service: Adam Merritt HN W. 04/25/2022 11:00 A M Medical Record Number: [id number] Patient Account Number: [id number] Date of Birth/Sex: Treating RN: [birth year] (76 y.o. Adam Merritt) Carlene Coria Primary Care Provider: Jonetta Osgood Other Clinician: Referring Provider: Treating Provider/Extender: Cephus Richer [name]: 1 Active Problems ICD-10 Encounter Code Description Active Date MDM Diagnosis I89.0 Lymphedema, not elsewhere classified 04/18/2022 No Yes I87.333 Chronic venous hypertension (idiopathic) with ulcer and inflammation of 04/18/2022 No Yes bilateral lower extremity L97.822 Non-pressure chronic ulcer of other part of left lower leg with fat layer exposed11/14/2023 No Yes L97.812 Non-pressure chronic ulcer of other part of right lower leg with fat layer 04/18/2022 No Yes exposed Inactive Problems Resolved Problems Electronic Signature(s) Signed: 04/25/2022 11:09:09 AM By: Worthy Keeler PA-C Entered By: Worthy Keeler on 04/25/2022 11:09:08 [name] ([id number]) [id number] Page 9 of 16 -------------------------------------------------------------------------------- Progress Note Details Patient Name: Date of Service: Adam Merritt. 04/25/2022 11:00 A M Medical Record Number: [id number] Patient Account Number:  [id number] Date of Birth/Sex: Treating RN: [birth year] (76 y.o. Adam Merritt) Carlene Coria Primary Care Provider: Jonetta Osgood Other Clinician: Referring Provider: Treating Provider/Extender: Cephus Richer [name]: 1 Subjective Chief Complaint Information obtained from Patient Bilateral LE edema and weeping ulcers History of Present Illness (HPI) 10/18/17-He is here for initial evaluation of bilateral lower extremity ulcerations in the presence of venous insufficiency and lymphedema. He has been seen by vascular medicine in the past, Dr. Lucky Cowboy, last seen in 2016. He does have a history of abnormal ABIs, which is to be expected given his lymphedema and venous insufficiency. According to Epic, it appears that all attempts for arterial evaluation and/or angiography were not follow through with by patient. He does have a history of being seen in lymphedema clinic in 2018, stopped going approximately 6 months ago stating "it didn't do any good". He  does not have lymphedema pumps, he does not have custom fit compression wrap/stockings. He is diabetic and his recent A1c last month was 7.6. He admits to chronic bilateral lower extremity pain, no change in pain since blister and ulceration development. He is currently being treated with Levaquin for bronchitis. He has home health and we will continue. 10/25/17-He is here in follow-up evaluation for bilateral lower extremity ulcerationssubtle he remains on Levaquin for bronchitis. Right lower extremity with no evidence of drainage or ulceration, persistent left lower extremity ulceration. He states that home health has not been out since his appointment. He went to [facility] on Tuesday, studies revealed: RIGHT ABI 0.9, TBI 0.6 LEFT ABI 1.1, TBI 0.6 with triphasic flow bilaterally. We will continue his same treatment plan. He has been educated on compression therapy and need for elevation. He will benefit from lymphedema  pumps 11/01/17-He is here in follow-up evaluation for left lower extremity ulcer. The right lower extremity remains healed. He has home health services, but they have not been out to see the patient for 2-3 weeks. He states it home health physical therapy changed his dressing yesterday after therapy; he placed Ace wrap compression. We are still waiting for lymphedema pumps, reordered d/t need for company change. 11/08/17-He is here in follow-up evaluation for left lower extremity ulcer. It is improved. Edema is significantly improved with compression therapy. We will continue with same treatment plan and he will follow-up next week. No word regarding lymphedema pumps 11/15/17-He is here in follow-up evaluation for left lower extremity ulcer. He is healed and will be discharged from wound care services. I have reached out to [facility] regarding his lymphedema pumps. They have been unable to reach the patient; the contact number they had with the patient's wife's cell phone and she has not answered any unrecognized calls. Contact should be made today, trial planned for next week; [facility] will continue to follow 11/27/17 on evaluation today patient has multiple blistered areas over the right lower extremity his left lower extremity appears to be doing okay. These blistered areas show signs of no infection which is great news. With that being said he did have some necrotic skin overlying which was mechanically debrided away with saline and gauze today without complication. Overall post debridement the wounds appear to be doing better but in general his swelling seems to be increased. This is obviously not good news. I think this is what has given rise to the blisters. 12/04/17 on evaluation today patient presents for follow-up concerning his bilateral lower extremity edema in the right lower extremity ulcers. He has been tolerating the dressing changes without complication. With that being said  he has had no real issues with the wraps which is also good news. Overall I'm pleased with the progress he's been making. 12/11/17 on evaluation today patient appears to be doing rather well in regard to his right lateral lower extremity ulcer. He's been tolerating the dressing changes without complication. Fortunately there does not appear to be any evidence of infection at this time. Overall I'm pleased with the progress that is being made. Unfortunately he has been in the hospital due to having what sounds to be a stomach virus/flu fortunately that is starting to get better. 12/18/17 on evaluation today patient actually appears to be doing very well in regard to his bilateral lower extremities the swelling is under fairly good control his lymphedema pumps are still not up and running quite yet. With that being  said he does have several areas of opening noted as far as wounds are concerned mainly over the left lower extremity. With that being said I do believe once he gets lymphedema pumps this would least help you mention some the fluid and preventing this from occurring. Hopefully that will be set up soon sleeves are Artie in place at his home he just waiting for the machine. 12/25/17 on evaluation today patient actually appears to be doing excellent in fact all of his ulcers appear to have resolved his legs appear very well. I do think he needs compression stockings we have discussed this and they are actually going to go to [place] today to elastic therapy to get this fitted for him. I think that is definitely a good thing to do. Readmission: 04/09/18 upon evaluation today this patient is seen for readmission due to bilateral lower extremity lymphedema. He has significant swelling of his extremities especially on the left although the right is also swollen he has weeping from both sides. There are no obvious open wounds at this point. Fortunately he has been doing fairly well for quite a bit of time  since I last saw him. Nonetheless unfortunately this seems to have reopened and is giving quite a bit of trouble. He states this began about a week ago when he first called [place] to get in to be seen. No fevers, chills, nausea, or vomiting noted at this time. He has not been using his lymphedema pumps due to the fact that they won't fit on his leg at this point likewise is also not been using his compression for essentially the same reason. 04/16/18 upon evaluation today patient actually appears to be doing a little better in regard to the fluid in his bilateral lower extremities. With that being said he's had three falls since I saw him last week. He also states that he's been feeling very poorly. I was concerned last week and feel like that the concern is still there as far as the congestion in his chest is concerned he seems to be breathing about the same as last week but again he states he's very weak he's not even able to walk further than from the chair to the door. His wife had to buy a wheelchair just to be able to get them out of the house to get to the appointment today. This has me very concerned. 04/23/18 on evaluation today patient actually appears to be doing much better than last week's evaluation. At that time actually had to transport him to the ER ZAIDIN, BLYDEN ([id number]) [id number] Page 10 of 16 via EMS and he subsequently was admitted for acute pulmonary edema, acute renal failure, and acute congestive heart failure. Fortunately he is doing much better. Apparently they did dialyze him and were able to take off roughly 35 pounds of fluid. Nonetheless he is feeling much better both in regard to his breathing and he's able to get around much better at this time compared to previous. Overall I'm very happy with how things are at this time. There does not appear to be any evidence of infection currently. No fevers, chills, nausea, or vomiting noted at this  time. 04/30/2018 patient seen today for follow-up and management of bilateral lower extremity lymphedema. He did express being more sad today than usual due to the recent loss of his dog. He states that he has been compliant with using the lymphedema pumps. However he does admit a minute over the last 2-3  days he has not been using the pumps due to the recent loss of his dog. At this time there is no drainage or open wounds to his lower extremities. The left leg edema is measuring smaller today. Still has a significant amount of edema on bilateral lower extremities With dry flaky skin. He will be referred to the lymphedema clinic for further management. Will continue 3 layer compression wraps and follow-up in 1-2 weeks.Denies any pain, fever, chairs recently. No recent falls or injuries reported during this visit. 05/07/18 on evaluation today patient actually appears to be doing very well in regard to his lower extremities in general all things considered. With that being said he is having some pain in the legs just due to the amount of swelling. He does have an area where he had a blister on the left lateral lower extremity this is open at this point other than that there's nothing else weeping at this time. 05/14/18 on evaluation today patient actually appears to be doing excellent all things considered in regard to his lower extremities. He still has a couple areas of weeping on each leg which has continued to be the issue for him. He does have an appointment with the lymphedema clinic although this isn't until February 2020. That was the earliest they had. In the meantime he has continued to tolerate the compression wraps without complication. 05/28/18 on evaluation today patient actually appears to be doing more poorly in regard to his left lower extremity where he has a wound open at this point. He also had a fall where he subsequently injured his right great toe which has led to an open wound at  the site unfortunately. He has been tolerating the dressing changes without complication in general as far as the wraps are concerned that he has not been putting any dressing on the left 1st toe ulcer site. 06/11/18 on evaluation today patient appears to be doing much worse in regard to his bilateral lower extremity ulcers. He has been tolerating the dressing changes without complication although his legs have not been wrapped more recently. Overall I am not very pleased with the way his legs appear. I do believe he needs to be back in compression wraps he still has not received his compression wraps from the [facility] hospital as of yet. 06/18/18 on evaluation today patient actually appears to be doing significantly better than last time I saw him. He has been tolerating the compression wraps without complication in the circumferential ulcers especially appear to be doing much better. His toe ulcer on the right in regard to the great toe is better although not as good as the legs in my pinion. No fevers chills noted 07/02/18 on evaluation today patient appears to be doing much better in regard to his lower extremity ulcers. Unfortunately since I last saw him he's had the distal portion of his right great toe if he dated it sounds as if this actually went downhill very quickly. I had only seen him a few days prior and the toe did not appear to be infected at that point subsequently became infected very rapidly and it was decided by the surgeon that the distal portion of the toe needed to be removed. The patient seems to be doing well in this regard he tells me. With that being said his lower extremities are doing better from the standpoint of the wounds although he is significantly swollen at this point. 07/09/18 on evaluation today patient appears to be  doing better in regard to the wounds on his lower extremities. In fact everything is almost completely healed he is just a small area on the left posterior lower  extremity that is open at this point. 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 With that being said he needs some kind of compression it does sound like he may not be wearing his compression, that is the wraps, during the entire time between when he's here visit to visit. Apparently his wife took the current one off because it began to "fall apart". 07/16/18 on evaluation today patient appears to be extremely swollen especially in regard to his left lower extremity unfortunately. He also has a new skin tear over the left lower extremity and there's a smaller area on the right lower extremity as well. Unfortunately this seems to be due in part to blistering and fluid buildup in his leg. He did get the reduction wraps that were ordered by the [facility] hospital for him to go to lymphedema clinic. With that being said his wounds on the legs have not healed to the point to where they would likely accept them as a patient lymphedema clinic currently. We need to try to get this to heal. With that being said he's been taking his wraps off which is not doing him any favors at this point. In fact this is probably quite counterproductive compared to what needs to occur. We will likely need to increase to a four layer compression wrap and continue to also utilize elevation and he has to keep the wraps on not take them off as he's been doing currently hasn't had a wrap on since Saturday. 07/23/18 on evaluation today patient appears to be doing much better in regard to his bilateral lower extremities. In fact his left lower extremity which was the largest is actually 15 cm smaller today compared to what it was last time he was here in our clinic. This is obviously good news after just one week. Nonetheless the differences he actually kept  the wraps on during the entire week this time. That's not typical for him. I do believe he understands a little bit better now the severity of the situation and why it's important for him to keep these wraps on. 07/30/18 on evaluation today patient actually appears to be doing rather well in regard to his lower extremities. His legs are much smaller than they have been in the past and he actually has only one very small rudder superficial region remaining that is not closed on the left lateral/posterior lower extremity even this is almost completely close. I do believe likely next week he will be healed without any complications. I do think we need to continue the wraps however this seems to be beneficial for him. I also think it may be a good time for [place] to go ahead and see about getting the appointment with the lymphedema clinic which is supposed to be made for him in order to keep this moving along and hopefully get them into compression wraps that will in the end help him to remain healed. 08/06/18 on evaluation today patient actually appears to be doing very well in regard as bilateral lower extremities. In fact his wounds appear to be completely healed at this time. He does have bilateral lymphedema which has been  extremely well controlled with the compression wraps. He is in the process of getting appointment with the lymphedema clinic we have made this referral were just waiting to hear back on the schedule time. We need to follow up on that today as well. 08/13/18 on evaluation today patient actually appears to be doing very well in regard to his bilateral lower extremities there are no open wounds at this point. We are gonna go ahead and see about ordering the Velcro compression wraps for him had a discussion with them about [place] doing it versus the [place] they feel like they can definitely afford going ahead and get the wraps themselves and they would prefer to try to avoid having to go to the  lymphedema clinic if it all possible which I completely understand. As long as he has good compression I'm okay either way. 3/17//20 on evaluation today patient actually appears to be doing well in regard to his bilateral lower extremity ulcers. He has been tolerating the dressing changes without complication specifically the compression wraps. Overall is had no issues my fingers finding that I see at this point is that he is having trouble with constipation. He tells me he has not been able to go to the bathroom for about six days. He's taken to over-the-counter oral laxatives unfortunately this is not helping. He has not contacted his doctor. 08/27/18 on evaluation today patient appears to be doing fairly well in regard to his lower extremities at this point. There does not appear to be any new altars is swelling is very well controlled. We are still waiting for his Velcro compression wraps to arrive that should be sometime in the next week is Artie sent the check for these. 09/03/18 on evaluation today patient appears to be doing excellent in regard to his bilateral lower extremities which he shows no signs of wound openings in regard to this point. He does have his Velcro compression wraps which did arrive in the mail since I saw him last week. Overall he is doing excellent in my pinion. 09/13/18 on evaluation today patient appears to be doing very well currently in regard to the overall appearance of his bilateral lower extremities although he's a little bit more swollen than last time we saw him. At that point he been discharged without any open wounds. Nonetheless he has a small open wound on the posterior left lower extremity with some evidence of cellulitis noted as well. Fortunately I feel like he has made good progress overall with regard to his lower extremities from were things used to be. SOU, NOHR ([id number]) [id number] Page 11 of 16 09/20/18 on  evaluation today patient actually appears to be doing much better. The erythematous lower extremity is improving wound itself which is still open appears to be doing much better as far as but appearance as well as pain is concerned overall very pleased in this regard. There's no signs of active infection at this time. 09/27/18 on evaluation today patient's wounds on the lower extremity actually appear to be doing fairly well at this time which is good news. There is no evidence of active infection currently and again is just as left lower extremity were there any wounds at this point anyway. I believe they may be completely healed but again I'm not 100% sure based on evaluation today. I think one more week of observation would likely be a good idea. 10/04/18 on evaluation today patient actually appears to be doing excellent in regard to  the left lower extremity which actually appears to be completely healed as of today. Unfortunately he's been having issues with his right lower extremity have a new wound that has opened. Fortunately there's no evidence of active infection at this time which is good news. 10/10/18 upon evaluation today patient actually appears to be doing a little bit worse with the new open area on his right posterior lower extremity. He's been tolerating the dressing changes without complication. Right now we been using his compression wraps although I think we may need to switch back to actually performing bilateral compression wraps are in the clinic. No fevers, chills, nausea, or vomiting noted at this time. 10/17/18 on evaluation today patient actually appears to be doing quite well in regard to his bilateral lower extremity ulcers. He has been tolerating the reps without complication although he would prefer not to rewrap his legs as of today. Fortunately there's no signs of active infection which is good news. No fevers, chills, nausea, or vomiting noted at this time. 10/24/18 on  evaluation today patient appears to be doing very well in regard to his lower extremities. His right lower extremity is shown signs of healing and his left lower [place] though not healed appears to be improving which is excellent news. Overall very pleased with how things seem to be progressing at this time. The patient is likewise happy to hear this. His Velcro compression wraps however have not been put on properly will gonna show his wife how to do that properly today. 10/31/18 on evaluation today patient appears to be doing more poorly in regard to his left lower extremity in particular although both lower extremities actually are showing some signs of being worse than my previous evaluation. Unfortunately I'm just not sure that his compression stockings even with the use of the compression/lymphedema pumps seem to be controlling this well. Upon further questioning he tells me that he also is not able to lie flat in the bed due to his congestive heart failure and difficulty breathing. For that reason he sleeps in his recliner. T make matters worse his recliner also cannot even hold his legs up o so instead of even being somewhat elevated they pretty much hang down to the floor. This is the way he sleeps each night which is definitely counterproductive to everything else that were attempting to do from the standpoint of controlling his fluid. Nonetheless I think that he potentially could benefit from a hospital bed although this would be something that his primary care provider would likely have to order since anything that is order on our side has to be directly related to wound care and again the hospital bed is not necessarily a direct relation to although I think it does contribute to his overall wound status on his lower extremities. 11/07/18 on evaluation today patient appears to be doing better in regard to his bilateral lower extremity. He's been tolerating the dressing changes  without complication. We did in the interim since I last saw him switch to just using extras orbiting new alginate and that seems to have done much better for him. I'm very pleased with the overall progress that is made. 11/14/18 on evaluation today patient appears to be redoing rather well in regard to his left lower extremity ulcers which are the only ones remaining at this point. Fortunately there's no signs of active infection at this time which is good news. Overall been very pleased with how things seem to be progressing currently.  No fevers, chills, nausea, or vomiting noted at this time. 11/20/17 on evaluation today patient actually appears to be doing quite well in regard to his lower extremities on the left on the right he has several blisters that showed up although there's some question about whether or not he has had his broker compression wraps on like he was supposed to or not. Fortunately there's no signs of active infection at this time which is good news. Unfortunately though he is doing well from the standpoint of the left leg the right leg is not doing as well again this is when we did not wrap last week. 11/28/18 on evaluation today patient appears to be doing well in regard to his bilateral lower extremities is left appears to be healed is right is not healed but is very close to being so. Overall very pleased with how things seem to be progressing. Patient is likewise happy that things are doing well. 12/09/18 on evaluation today patient actually appears to be completely healed which is excellent news. He actually seems to be doing well in regard to the swelling of the bilateral lower extremities which is also great news. Overall very pleased with how things seem to be progressing. Readmission: 03/18/2019 upon evaluation today patient presents for reevaluation concerning issues that he is having with his left lower extremity where he does have a wound noted upon inspection today. He  also has a wound on the right second toe on the tip where it is apparently been rubbing in his shoe I did look at issues and you can actually see where this has been occurring as well. Fortunately there is no signs of infection with regard to the toe necessarily although it is very swollen compared to normal that does have me somewhat concerned about the possibility of further evaluation for infection/osteomyelitis. I would recommend an x-ray to start with. Otherwise he states that it is been 1-2 weeks that he has had the draining in regard to left lower extremity he does not know how this happened he has been wearing his compression appropriately and I do see that as well today but nonetheless I do think that he needs to continue to be very cautious with regard to elevation as well. 03/25/2019 on evaluation today patient appears to be doing much better in regard to his lower extremity at this time. That is on the left. He did culture positive for Pseudomonas but the good news is he seems to be doing much better the gentamicin we applied topically seems to have done a great job for him. There is no signs of active infection at this time systemically and locally he is doing much better. No fevers, chills, nausea, vomiting, or diarrhea. In regard to his right toe this seems to be doing much better there is very little pressure noted at this point I did clean up some of the callus today around the edges of the wound as well as the surface of the wound but to be honest he is progressing quite nicely. 10/30; the area on the left anterior lower tibia area is healed over. His edema control is adequate even though his use of the compression pumps seems very intermittent. He has a juxta lite stocking on the right leg and a think there is 1 for the left leg at home. His wife is putting these on. He has an area on the plantar right second toe which is a hammertoe they are trying to offload this 04/08/2019  on  evaluation today patient appears to be doing well in regard to his lower extremities which is good news. We are seeing him today for his toe ulcer which is giving him a little bit more trouble but again still seems to be doing better at this point which is good news. He is going require some sharp debridement in regard to the toe today however. 04/15/2019 on evaluation today patient actually appears to be doing quite well with regard to his toe ulcer. I am very pleased with how things are doing in that regard. With regard to his lower extremity edema in general he tells me he is not been using the lymphedema pumps regularly although he does not use them. I think that he needs to use them more regularly he does have a small blister on the left lower extremity this is not open at this point but obviously it something that can get worse if he does not keep the compression going and the pumps going as well. 04/22/2019 on evaluation today patient appears to be doing well with regard to his toe ulcer. He has been tolerating the dressing changes without complication. This is measuring slightly smaller this week as compared to last week. Fortunately there is no signs of active infection at this time. 05/06/2019 on evaluation today patient actually appears to be doing excellent. In fact his toe appears to be completely healed which is great news. There is no signs of active infection at this time. Readmission: Patient presents today for follow-up after having been discharged at the beginning of December 2020. He states that in recent weeks things have reopened and has been having more trouble at this point. With that being said fortunately there is no signs of active infection at this time. No fever chills noted. LADISLAUS, REPSHER ([id number]) [id number] Page 12 of 16 Nonetheless he also does have an issue with one of his toes as well that seems to be somewhat this is on the right foot  second toe. 07/25/2019 upon evaluation today patient's lower extremities appear to be doing excellent bilaterally. I really feel like he is showing signs of great improvement and in fact I think he is close to healing which is great news. There is no signs of active infection at this time. No fevers, chills, nausea, vomiting, or diarrhea. 07/31/2019 upon evaluation today patient appears to be doing excellent and in fact I think may be completely healed in regard to his right lower extremity that were still to monitor this 1 more week before closing it out. The left lower extremity is measuring smaller although not completely closed seems to be doing excellent. 08/07/2019 upon evaluation today patient appears to be doing excellent in regard to his wounds. In fact the right lower extremity is completely healed there is no issues here. The left lower extremity has 1 very small area still remaining fortunately there is no signs of infection and overall I think he is very close to closure of the here as well. 08/14/19 upon evaluation today patient appears to be doing excellent in regard to his lower extremities. In fact he appears to be completely healed as of today. Fortunately there's no signs of active infection at this time. No fevers, chills, nausea, or vomiting noted at this time. READMISSION 09/26/2019 This is a 76 year old man who is well-known to this clinic having just been discharged on 08/14/2019. He has known lymphedema and chronic venous insufficiency. He has Farrow wrap stockings and also external  compression pumps. He does not use the external compression pumps however. He does however apparently fairly faithfully use the stockings. They have not been lotion in his legs. He has developed new wounds on the right anterior as well as the left medial left lateral and left posterior calf. He returns for our review of this Past medical history includes coronary artery disease, hypertension, congestive  heart failure, COPD, venous insufficiency with lymphedema, type 2 diabetes. He apparently has compression pumps, Farrow wraps and at 1 point a hospital bed although I believe he sleeps in a recliner 10/03/2019 upon evaluation today patient appears to be doing better with regard to his wounds in general. He has been tolerating the dressing changes without complication. Fortunately there is no signs of active infection. No fevers, chills, nausea, vomiting, or diarrhea. I do believe the compression wraps are helping as they always have in the past. 10/10/2019 upon evaluation today patient appears to be doing excellent at this point. His right lower extremity is measuring much better and in fact is completely healed. His left lower extremity is also measuring better though not healed seems to be doing excellent at this point which is great news. Overall I am very pleased with how things appear currently. 10/27/2019 upon evaluation today patient appears to be doing quite well with regard to his wounds currently. He has been tolerating the dressing changes without complication. Fortunately there is no signs of active infection at this time. In fact he is almost completely healed on the left and has just a very small area open and on the right he is completely healed. 11/04/2019 upon evaluation today patient appears to be doing quite well with regard to his wounds. In fact he appears to be quite possibly completely healed on the left lower extremity now as well the right lower extremity is still doing well. With that being said unfortunately though this may be healed I think it is a very fragile area and I cannot even confirm there is not a very small opening still remaining. I really think he may benefit from 1 additional weeks wrapping before discontinuing. 11/10/2019 upon evaluation today patient appears to be doing well in regard to his original wounds in fact everything that we were treating last week is  completely healed on the left. Unfortunately he has a new skin tear just above where the wrap slipped down due to a fall he sustained causing this injury. 11/17/2019 on evaluation today patient appears to be doing better with regard to his wound. He has been tolerating the dressing changes without complication. Fortunately there is no signs of active infection at this time. No fevers, chills, nausea, vomiting, or diarrhea. 11/24/2019 upon evaluation today patient appears to be doing well with regard to his wound. This is making good progress there does not appear to be signs of active infection but overall I do feel like he is headed in the correct direction. Overall he is tolerating the compression wrap as well. 12/02/2019 upon evaluation today patient appears to be doing well with regard to his leg ulcer. In fact this appears to be completely healed and this is the last of the wounds that he had but still the left anterior lower extremity. Fortunately there is no signs of active infection at this time. No fevers, chills, nausea, vomiting, or diarrhea. Readmission: 02/05/2020 on evaluation today patient appears to be doing well with regard to his wound all things considered. He actually tells me that a couple weeks  ago he had trouble with his wraps and therefore took him off for a couple of days in order to give his legs a break. It was during this time that he actually developed increased swelling and edema of his legs and blisters on both legs. That is when he called to make the appointment. Nonetheless since that time he began wearing his compression wraps which are Velcro in the meantime and has done extremely well with this. In fact his leg appears to be under good control as far as edema is concerned today it is nothing like what I am seeing when he has come in for evaluations in the past. They have been using some of the silver alginate at home which has been beneficial for him. Fortunately there  is no signs of active infection at this time. No fevers, chills, nausea, vomiting, or diarrhea. 02/19/2020 on evaluation today patient unfortunately does not appear to be doing quite as well as I would like to see. He has no signs of active infection at this time but unfortunately he is not doing well in regard to the overall appearance of his left leg. He has a couple other areas that are weeping now and the leg is much more swollen. I think that he needs to actually have a compression wrap to try to manage this. 02/26/2020 on evaluation today patient appears to be doing well with regard to his left lower extremity ulcer. Fortunately the swelling is down I do believe the pressure wrap seems to be doing quite well. Fortunately there is no signs of active infection at this time. 03/05/2020 upon evaluation today patient appears to be doing excellent in regard to his legs at this point. Fortunately there is no signs of active infection which is great news. Overall he feels like he is completely healed which is great news. Readmission: 05/20/2020 upon evaluation today patient appears to be doing well with regard to his leg ulcer all things considered. He is actually coming back to see [place] after having had a reopening of the ulcers on his left leg in the past several weeks. He is out of dressing supplies and his wife is been try to take care of this as best she can. 06/10/2020 upon evaluation today patient unfortunately appears to be doing significantly worse today compared to when I last saw him. He and his wife both tell me that "there has been a lot going on". I did not actually go into detail about everything that has been happening but nonetheless he has not obviously been wearing his compression. He brings them with him today but they were not on. I am not even certain to be honest that he could get those on at this point. Nonetheless I do believe that he is going to require compression wrapping at some  point shortly but right now we need to try to get what appears to be infection under control at this time. 06/28/2020 upon evaluation today patient appears to be doing well with regard to 06/28/2020 upon evaluation today patient appears to be doing well pretty well in regard to his left leg which is much better than previous. With that being said in regard to his right leg he does seem to be having some issues with a new Adam Merritt, Adam Merritt ([id number]) [id number] Page 13 of 16 wound after he sustained a fall. This fortunately is not too deep but does appear to be something we need to address. Neither deep which is good.  07/05/2020 on evaluation today patient appears to be doing well with regard to his wounds. In fact everything on the left leg appears to be pretty much healed on the right leg this is very close though not completely closed yet. Fortunately there is no evidence of active infection at this time. No fevers, chills, nausea, vomiting, or diarrhea. 07/13/2019 upon evaluation today patient appears to be making good progress which is great news. Overall I am extremely pleased with where things stand today. There does not appear to be any signs of active infection which is great news and overall his left leg appears healed still the right leg though not healed does seem to be making good progress which is excellent news. He is having no pain. 07/19/2020 on evaluation today patient appears to be doing well with regard to his wound. He has been tolerating the dressing changes without complication. Fortunately there does not appear to be any signs of active infection at this time. No fevers, chills, nausea, vomiting, or diarrhea. Readmission: 08/20/2020 on evaluation today patient presents for reevaluation here in the clinic concerning issues that he has been having with his bilateral lower extremities. Unfortunately this is an ongoing reoccurring issue. He tells me that he is  really not certain exactly what caused the issue although as I discussed this further with him it appears that he has been sitting for the most part and sleeping in his chair with his feet on the ground he is not really elevating at all in fact the chair does not even have a reclining function therefore it is basically just him sitting with his feet on the ground. I think this alone has probably led to his increased swelling he is also having more difficulty walking which means that he is pumping less blood flow through his legs anyway as far as the venous stasis is concerned. All in all I think this is leading to a downward spiral of him not being able to walk very well as his legs are so heavy, they begin to weep, and overall he just does not seem to be doing as well as it was when I last saw him. 08/27/2020 upon evaluation today patient's legs actually are showing signs of improvement which is great news. There does not appear to be any evidence of infection which is also excellent news. I am extremely pleased with where things stand today. No fevers, chills, nausea, vomiting, or diarrhea. 09/03/20 upon evaluation today patient appears to be doing excellent in regard to his wounds currently. He has been tolerating the dressing changes without complication and overall I am extremely pleased with where things stand today. No fevers, chills, nausea, vomiting, or diarrhea. Overall he is healed on the right leg although the left leg not being completely healed still is doing significantly better. 09/10/2020/8/22 upon evaluation today patient appears to be doing well with regard to his wounds. He has been tolerating the dressing changes without complication. Fortunately he appears to be completely healed. I am very pleased in that regard. As is the patient his right leg is also doing well he did get a new recliner which is helping him keep his feet off the ground which I think is helped he is down 2 cm on the  left compared to last week so I think there is some definite improvement here. Readmission 12/15/20: [name] is a 76 year old male with a past medical history of lymphedema, insulin-dependent type 2 diabetes, COPD and congestive  heart failure that presents to the clinic for increased drainage from his legs bilaterally, Left greater than right. Patient has lymphedema. He has been seen in our clinic on multiple occasions for this issue. He currently denies signs of infection. He has not been using his lymphedema pumps. 12/23/2020 upon evaluation today patient appears to be doing well with regard to his wounds. He has been tolerating the dressing changes he really does not have any open wounds just weeping areas of the legs bilaterally and fortunately seems to be doing better at this point. 12/28/2020 upon evaluation today patient's wound is actually showing signs of good improvement. There does not appear to be any evidence of infection which is great news and overall I am extremely pleased at this point. I think is very close to complete resolution. 01/04/2021 upon evaluation today patient actually appears to be completely healed which is great news. I do not see any signs of active infection currently which is also great news. In general I am extremely pleased with where we stand and I do believe that the patient is tolerating the dressing changes without complication. I believe he is ready to go back into his Velcro compression and I did see that they brought them with him today. Readmission: Patient presents today for follow-up he was last seen August 2. He is having some issues with some mild drainage and leaking from the left leg. This is something that has reopened and again is a recurrent issue for him. Subsequently he does wear his Velcro compression wraps though I think we may want to look into something a little bit better for him I am thinking that we may want to try the juxta fit  compression which I think would do much more effective compression treatment overall for him. 04/08/2021 upon evaluation today patient appears to be doing well with regard to his legs. I do feel like he is showing signs of improvement which is great news. No fevers, chills, nausea, vomiting, or diarrhea. 04/15/2021 upon evaluation today patient's wounds actually appear to be very close to complete resolution. He is legs are actually doing well left leg just has a small area of leaking at this point. Overall I think that we are very close to complete resolution based on what I am seeing. Nonetheless he seems to not be feeling too well today I really do not know what exactly is going on here. 04/22/2021 upon evaluation patient appears to be completely healed based on what I see today. I do not see anything open both legs appear to be doing quite well. 05/20/2021 upon evaluation today patient appears to be doing poorly in regard to his legs. He has not been a full month since we last saw him and he has reopened unfortunately. He does not appear to be any signs of active infection at this time which is great news. No fevers, chills, nausea, vomiting, or diarrhea. 12/27; the patient comes in the clinic today with severe bilateral lymphedema very dry skin but paradoxically his wounds are actually healed or should I say not open. He has compression pumps that he does not use. [Once per week] he does not have compression stockings. He comes in today with a small presumably pressure ulcer on the lateral plantar heel on the right. The patient states he has been complaining about pain in this area for about 2 months although no wound was recognized until our intake nurse was doing her exam 06/16/2021 upon evaluation today patient  appears to be doing better in regard to his wounds. He is not completely healed yet especially in regard to the right heel which is still open. Nonetheless I do think that his left leg  is almost completely healed the right leg appears to be doing decently well his right heel is what still mainly open currently. 06/30/2021 upon evaluation today patient appears to be doing well with regard to his wound. Has been tolerating the dressing changes without complication. Fortunately I think his legs are doing quite well the heel is also doing better. In general I am very pleased with where we stand today. 07/14/2021 upon evaluation today patient appears to be doing well with regard to his heel ulcer. Fortunately I think that it is showing some signs of improvement unfortunately there is a little bit of skin overlying the wound surface which is actually causing this to breakdown a little bit to be honest. It looks a little macerated. Nonetheless we need to clean this away. 07/28/2021 upon evaluation today patient appears to be doing well currently in regard to his heel ulcer which is almost completely closed and this is great news. Lets about the end of where the great news stands. In regard to his legs unfortunately both legs are weeping at this point there really are not any true open wounds. Nonetheless I do believe that working half the compression wrap him in order to get this under control. I discussed that with the patient and his wife today. JAIS, DEMIR ([id number]) [id number] Page 14 of 16 08/04/2021 upon evaluation today patient appears to be doing well currently in regard to his legs. Overall things are doing quite nicely. I do not see any evidence of active infection locally or systemically at this time which is great news and overall I am extremely pleased with where we stand today. No fevers, chills, nausea, vomiting, or diarrhea. 3/9; patient presents for follow-up. He has been using his lymphedema pumps twice daily with improvement in lower extremity swelling. He has no issues or complaints today. He denies signs of infection. 08/18/2021 upon  evaluation patient appears to be doing excellent at this point today. In fact he really does not have any open wounds whatsoever which is great news. Honestly I do think that the patient is tolerating the compression wraps quite nicely and his legs are significantly smaller he is also using his lymphedema pumps and he is able to lift his legs. He is walking better and getting up and down better everything about what is going on at this point seems to be doing excellent and very pleased with how the patient is doing currently. 08/25/2021 upon evaluation today patient appears to be doing very well in regard to his wound. In fact everything on his heel as well as his legs are showing signs of good improvement. Everything is closed up at this point. Readmission: 04-18-2022 upon evaluation today patient appears to be doing poorly in regard to his bilateral lower extremities. Has been tolerating the dressing changes with his wife at home she has been putting on what she could do to try to help protect the area she tells me a lot of times she has been using lotion as well as the [place] 4000 wraps which to be honest are really not the best thing for him. They do not fit his legs fully and do not get help keep it compressed appropriately. Fortunately there does not appear to be any signs of infection at this  time which is good news. Nonetheless I do believe we need to get his swelling under control. 04-25-2022 upon evaluation today patient appears to be doing well currently in regard to his legs I do feel like he is measuring better and smaller which is good news. With that being said there is still definitely some ways to go before we get this to the point we wanted to be as far as completely being healed is concerned. Objective Constitutional Well-nourished and well-hydrated in no acute distress. Vitals Time Taken: 11:10 AM, Height: 66 in, Weight: 304 lbs, BMI: 49.1, Temperature: 98.5 F, Pulse: 88 bpm,  Respiratory Rate: 18 breaths/min, Blood Pressure: 137/87 mmHg. Respiratory normal breathing without difficulty. Psychiatric this patient is able to make decisions and demonstrates good insight into disease process. Alert and Oriented x 3. pleasant and cooperative. General Notes: Upon inspection patient's wound bed actually showed signs of good granulation epithelization at this point. Fortunately I do not see any signs of worsening which is great and overall I am extremely happy with where we stand at this time. I think he is very close to complete resolution as far as the weeping is concerned and then once we get that under control the patient can then subsequently proceed with the Velcro compression wraps. 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 Post procedure Diagnosis Wound #: Same as Pre-Procedure There was a Four Layer Compression Therapy Procedure by Carlene Coria, RN. Post procedure Diagnosis Wound #: Same as Pre-Procedure ENGLISH, CRAIGHEAD ([id number]) [id number] Page 15 of 16 Plan Follow-up Appointments: Return Appointment in 1 week. Home Health: [facility] for wound care. May utilize formulary equivalent dressing for wound treatment orders unless otherwise specified. Home Health Nurse may visit PRN to address patientoos wound care needs. Marta Lamas [phone] Bathing/ Shower/ Hygiene: May shower; gently cleanse wound with antibacterial soap, rinse and pat dry prior to dressing wounds Edema Control - Lymphedema / Segmental Compressive Device / Other: Optional:  One layer of unna paste to top of compression wrap (to act as an anchor). 4 Layer Compression System Lymphedema. Elevate, Exercise Daily and Avoid Standing for Long Periods of Time. Elevate legs to the level of the heart and pump ankles as often as possible Elevate leg(s) parallel to the floor when sitting. Compression Pump: Use compression pump on left lower extremity for 60 minutes, twice daily. Compression Pump: Use compression pump on right lower extremity for 60 minutes, twice daily. 1. I suggested the patient continue with the 4-layer compression wraps that I feel like has been beneficial for him currently. Would not continue with that as such. 2. Also can recommend that he continue to monitor for any signs of worsening infection if you think changes he should let me know. We will see patient back for reevaluation in 1 week here in the clinic. If anything worsens or changes patient will contact our office for additional recommendations. We will plan to order Velcro compression wraps that is the [place] basic wraps hopefully next week once we know that his swelling is under good control. Electronic Signature(s) Signed: 04/25/2022 1:10:12 PM By: Worthy Keeler PA-C Entered By: Worthy Keeler on 04/25/2022 13:10:11 --------------------------------------------------------------------------------  SuperBill Details Patient Name: Date of Service: Adam Merritt. 04/25/2022 Medical Record Number: [id number] Patient Account Number: [id number] Date of Birth/Sex: Treating RN: 25-Apr-1946 (76 y.o. Adam Merritt) Carlene Coria Primary Care Provider: Jonetta Osgood Other Clinician: Referring Provider: Treating Provider/Extender: Cephus Richer [name]: 1 Diagnosis Coding ICD-10 Codes Code Description I89.0 Lymphedema, not elsewhere classified I87.333 Chronic venous hypertension (idiopathic) with ulcer and inflammation of bilateral lower extremity L97.822 Non-pressure chronic ulcer  of other part of left lower leg with fat layer exposed L97.812 Non-pressure chronic ulcer of other part of right lower leg with fat layer exposed Facility Procedures : CPT4: Code [id number] Description: 81 BILATERAL: Application of multi-layer venous compression system; leg (below knee), including ankle and t. Modifier: Quantity: 1 Physician Procedures : CPT4 Code Description Modifier [id number] - WC PHYS LEVEL 3 - EST PT ICD-10 Diagnosis Description HINES, KLOSS ([id number]) [id number] I89.0 Lymphedema, not elsewhere classified I87.333 Chronic venous hypertension  (idiopathic) with ulcer and inflammation of bilateral lower extremity L97.822 Non-pressure chronic ulcer of other part of left lower leg with fat layer exposed L97.812 Non-pressure chronic ulcer of other part of right lower leg with fat layer exposed Quantity: 1 Page 16 of 16 Electronic Signature(s) Signed: 04/25/2022 1:10:27 PM By: Worthy Keeler PA-C Previous Signature: 04/25/2022 12:34:47 PM Version By: Carlene Coria RN Entered By: Worthy Keeler on 04/25/2022 13:10:27

## 2022-04-25 NOTE — Telephone Encounter (Signed)
Error

## 2022-04-25 NOTE — Telephone Encounter (Signed)
Faxed [employer] for wheelchair

## 2022-04-27 ENCOUNTER — Observation Stay
Admission: EM | Admit: 2022-04-27 | Discharge: 2022-05-05 | Disposition: A | Discharge status: Home Health | Payer: Medicare PPO | Attending: Internal Medicine | Admitting: Internal Medicine

## 2022-04-27 ENCOUNTER — Emergency Department: Payer: Medicare PPO

## 2022-04-27 ENCOUNTER — Other Ambulatory Visit: Payer: Self-pay

## 2022-04-27 DIAGNOSIS — Z8673 Personal history of transient ischemic attack (TIA), and cerebral infarction without residual deficits: Secondary | ICD-10-CM | POA: Diagnosis not present

## 2022-04-27 DIAGNOSIS — R296 Repeated falls: Secondary | ICD-10-CM

## 2022-04-27 DIAGNOSIS — F32A Depression, unspecified: Secondary | ICD-10-CM | POA: Diagnosis present

## 2022-04-27 DIAGNOSIS — Z1152 Encounter for screening for COVID-19: Secondary | ICD-10-CM | POA: Diagnosis not present

## 2022-04-27 DIAGNOSIS — Z951 Presence of aortocoronary bypass graft: Secondary | ICD-10-CM | POA: Diagnosis not present

## 2022-04-27 DIAGNOSIS — N390 Urinary tract infection, site not specified: Secondary | ICD-10-CM | POA: Diagnosis not present

## 2022-04-27 DIAGNOSIS — I5032 Chronic diastolic (congestive) heart failure: Secondary | ICD-10-CM | POA: Diagnosis not present

## 2022-04-27 DIAGNOSIS — I2510 Atherosclerotic heart disease of native coronary artery without angina pectoris: Secondary | ICD-10-CM | POA: Insufficient documentation

## 2022-04-27 DIAGNOSIS — Z7982 Long term (current) use of aspirin: Secondary | ICD-10-CM | POA: Insufficient documentation

## 2022-04-27 DIAGNOSIS — E1122 Type 2 diabetes mellitus with diabetic chronic kidney disease: Secondary | ICD-10-CM | POA: Diagnosis not present

## 2022-04-27 DIAGNOSIS — R531 Weakness: Principal | ICD-10-CM

## 2022-04-27 DIAGNOSIS — Z794 Long term (current) use of insulin: Secondary | ICD-10-CM | POA: Diagnosis not present

## 2022-04-27 DIAGNOSIS — J449 Chronic obstructive pulmonary disease, unspecified: Secondary | ICD-10-CM | POA: Diagnosis present

## 2022-04-27 DIAGNOSIS — L97919 Non-pressure chronic ulcer of unspecified part of right lower leg with unspecified severity: Secondary | ICD-10-CM | POA: Diagnosis present

## 2022-04-27 DIAGNOSIS — I10 Essential (primary) hypertension: Secondary | ICD-10-CM | POA: Diagnosis present

## 2022-04-27 DIAGNOSIS — N1832 Chronic kidney disease, stage 3b: Secondary | ICD-10-CM | POA: Diagnosis not present

## 2022-04-27 DIAGNOSIS — E1140 Type 2 diabetes mellitus with diabetic neuropathy, unspecified: Secondary | ICD-10-CM | POA: Diagnosis present

## 2022-04-27 DIAGNOSIS — J9811 Atelectasis: Secondary | ICD-10-CM | POA: Diagnosis not present

## 2022-04-27 DIAGNOSIS — I83029 Varicose veins of left lower extremity with ulcer of unspecified site: Secondary | ICD-10-CM | POA: Diagnosis present

## 2022-04-27 DIAGNOSIS — I890 Lymphedema, not elsewhere classified: Secondary | ICD-10-CM | POA: Diagnosis not present

## 2022-04-27 DIAGNOSIS — Z7951 Long term (current) use of inhaled steroids: Secondary | ICD-10-CM | POA: Insufficient documentation

## 2022-04-27 DIAGNOSIS — R609 Edema, unspecified: Secondary | ICD-10-CM | POA: Diagnosis not present

## 2022-04-27 DIAGNOSIS — I130 Hypertensive heart and chronic kidney disease with heart failure and stage 1 through stage 4 chronic kidney disease, or unspecified chronic kidney disease: Secondary | ICD-10-CM | POA: Diagnosis not present

## 2022-04-27 DIAGNOSIS — E1165 Type 2 diabetes mellitus with hyperglycemia: Secondary | ICD-10-CM | POA: Diagnosis present

## 2022-04-27 DIAGNOSIS — E782 Mixed hyperlipidemia: Secondary | ICD-10-CM | POA: Diagnosis present

## 2022-04-27 DIAGNOSIS — N183 Chronic kidney disease, stage 3 unspecified: Secondary | ICD-10-CM | POA: Diagnosis present

## 2022-04-27 DIAGNOSIS — L97929 Non-pressure chronic ulcer of unspecified part of left lower leg with unspecified severity: Secondary | ICD-10-CM | POA: Diagnosis present

## 2022-04-27 DIAGNOSIS — E66813 Obesity, class 3: Secondary | ICD-10-CM | POA: Insufficient documentation

## 2022-04-27 DIAGNOSIS — D649 Anemia, unspecified: Secondary | ICD-10-CM | POA: Diagnosis present

## 2022-04-27 DIAGNOSIS — R0902 Hypoxemia: Secondary | ICD-10-CM | POA: Diagnosis not present

## 2022-04-27 LAB — CBC WITH DIFFERENTIAL/PLATELET
Abs Immature Granulocytes: 0.03 10*3/uL (ref 0.00–0.07)
Basophils Absolute: 0 10*3/uL (ref 0.0–0.1)
Basophils Relative: 0 %
Eosinophils Absolute: 0.4 10*3/uL (ref 0.0–0.5)
Eosinophils Relative: 4 %
HCT: 34.7 % — ABNORMAL LOW (ref 39.0–52.0)
Hemoglobin: 11.4 g/dL — ABNORMAL LOW (ref 13.0–17.0)
Immature Granulocytes: 0 %
Lymphocytes Relative: 11 %
Lymphs Abs: 1.1 10*3/uL (ref 0.7–4.0)
MCH: 29.8 pg (ref 26.0–34.0)
MCHC: 32.9 g/dL (ref 30.0–36.0)
MCV: 90.8 fL (ref 80.0–100.0)
Monocytes Absolute: 0.8 10*3/uL (ref 0.1–1.0)
Monocytes Relative: 8 %
Neutro Abs: 7.8 10*3/uL — ABNORMAL HIGH (ref 1.7–7.7)
Neutrophils Relative %: 77 %
Platelets: 164 10*3/uL (ref 150–400)
RBC: 3.82 MIL/uL — ABNORMAL LOW (ref 4.22–5.81)
RDW: 13.5 % (ref 11.5–15.5)
WBC: 10 10*3/uL (ref 4.0–10.5)
nRBC: 0 % (ref 0.0–0.2)

## 2022-04-27 LAB — COMPREHENSIVE METABOLIC PANEL
ALT: 8 U/L (ref 0–44)
AST: 15 U/L (ref 15–41)
Albumin: 3.1 g/dL — ABNORMAL LOW (ref 3.5–5.0)
Alkaline Phosphatase: 77 U/L (ref 38–126)
Anion gap: 9 (ref 5–15)
BUN: 19 mg/dL (ref 8–23)
CO2: 26 mmol/L (ref 22–32)
Calcium: 8.2 mg/dL — ABNORMAL LOW (ref 8.9–10.3)
Chloride: 104 mmol/L (ref 98–111)
Creatinine, Ser: 1.92 mg/dL — ABNORMAL HIGH (ref 0.61–1.24)
GFR, Estimated: 35 mL/min — ABNORMAL LOW (ref >60)
Glucose, Bld: 114 mg/dL — ABNORMAL HIGH (ref 70–99)
Potassium: 3.9 mmol/L (ref 3.5–5.1)
Sodium: 139 mmol/L (ref 135–145)
Total Bilirubin: 0.9 mg/dL (ref 0.3–1.2)
Total Protein: 6.7 g/dL (ref 6.5–8.1)

## 2022-04-27 LAB — BRAIN NATRIURETIC PEPTIDE: B Natriuretic Peptide: 75.4 pg/mL (ref 0.0–100.0)

## 2022-04-27 LAB — TROPONIN I (HIGH SENSITIVITY): Troponin I (High Sensitivity): 5 ng/L (ref <18)

## 2022-04-27 LAB — TSH: TSH: 2.316 u[IU]/mL (ref 0.350–4.500)

## 2022-04-27 MED ORDER — CLOTRIMAZOLE 1 % EX CREA
TOPICAL_CREAM | Freq: Two times a day (BID) | CUTANEOUS | Status: DC
  Administered 2022-05-01: 1 via TOPICAL
  Filled 2022-04-27: qty 15

## 2022-04-27 NOTE — ED Triage Notes (Signed)
Pt brought in via ems from home due to bilateral leg swelling/pain. Pt states this is a chronic issue. Pt also co of weakness with ADL's/transitioning.

## 2022-04-27 NOTE — ED Provider Notes (Signed)
11:15 PM  Assumed care shift change.  Patient with chronic lymphedema here with generalized weakness and now unable to stand, ambulate.  Labs, urine pending.  Patient will need admission versus placement.   4:52 AM  Pt's lab work is unremarkable.  No leukocytosis.  Stable chronic kidney disease.  Normal electrolytes.  Negative troponin.  COVID and flu negative.  Normal TSH.  Urine shows early signs of infection.  Culture pending.  Will give Rocephin.  Chest x-ray reviewed and interpreted by myself and the radiologist and shows no acute abnormality.  Patient normally able to ambulate at home and lives with his wife.  Now unable to even stand or pivot.  Discussed the case with Dr. Myna Hidalgo on for the hospitalist service who agrees given this is an acute change in his ambulatory status with the possible early UTI that he would meet criteria for admission for observation.  Appreciate the hospitalist assistance with this patient.   [place] Sherburn, Delice Bison, DO 04/28/22 [phone]

## 2022-04-27 NOTE — ED Provider Notes (Signed)
[facility] Provider Note    Event Date/Time   First MD Initiated Contact with Patient 04/27/22 2219     (approximate)   History   Leg Swelling   HPI  Adam Merritt is a 76 y.o. male with a history of CAD, CHF, chronic bronchitis, diabetes, CKD, and lymphedema of bilateral legs who presents with generalized weakness that has worsened over the last couple of days.  The patient states that he has not been able to walk for a few years and is normally confined to his bedroom, however he is normally able to stand and pivot to transfer to the toilet.  However he has not been able to do so the last 2 days.  Today he needed to try to get up and he was unable to do so even with help from multiple family members.  The patient feels that he is dehydrated.  He denies any acute pain.  He has no fever or chills.  He denies difficulty breathing, dizziness or lightheadedness.  He denies any weakness or numbness that is more on one side or the other.  I reviewed the past medical records.  The patient's most recent outpatient encounter was on 11/21 for a telehealth visit with primary care.  The patient has nursing and physical therapy coming to his home and has been ordered for DME.  He also gets wound care visits for bilateral lower extremity ulcers and was last evaluated by them on 11/21.  His last ED visit was on 1/11 for neck and low back pain after a fall.  He has no recent inpatient admissions.   Physical Exam   Triage Vital Signs: ED Triage Vitals  Enc Vitals Group     BP      Pulse      Resp      Temp      Temp src      SpO2      Weight      Height      Head Circumference      Peak Flow      Pain Score      Pain Loc      Pain Edu?      [place]     Most recent vital signs: Vitals:   04/27/22 2221  BP: 138/64  Pulse: 89  Resp: (!) 22  Temp: 98.9 F (37.2 C)  SpO2: 96%     General: Alert and oriented, chronically ill-appearing but in no acute  distress. CV:  Good peripheral perfusion.  Resp:  Normal effort.  Abd:  No distention.  Skin of lower abdomen underneath pannus with erythema and clear discharge; no induration, drainage, or fluctuance. Other:  3+ bilateral lower extremity edema.  Slightly dry mucous membranes.  Intact motor strength bilateral upper and lower extremities.   ED Results / Procedures / Treatments   Labs (all labs ordered are listed, but only abnormal results are displayed) Labs Reviewed  CBC WITH DIFFERENTIAL/PLATELET - Abnormal; Notable for the following components:      Result Value   RBC 3.82 (*)    Hemoglobin 11.4 (*)    HCT 34.7 (*)    Neutro Abs 7.8 (*)    All other components within normal limits  RESP PANEL BY RT-PCR (FLU A&B, COVID) [id number]  COMPREHENSIVE METABOLIC PANEL  BRAIN NATRIURETIC PEPTIDE  URINALYSIS, ROUTINE W REFLEX MICROSCOPIC  TSH  TROPONIN I (HIGH SENSITIVITY)     EKG  ED  ECG REPORT I, Arta Silence, the attending physician, personally viewed and interpreted this ECG.  Date: 04/27/2022 EKG Time: 2221 Rate: 89 Rhythm: normal sinus rhythm QRS Axis: normal Intervals: Nonspecific IVCD ST/T Wave abnormalities: Nonspecific T wave abnormalities Narrative Interpretation: no evidence of acute ischemia; there is no significant change when compared to EKG of 06/15/2021    RADIOLOGY  Chest x-ray: I independently viewed and interpreted the images; there is no focal consolidation or edema   PROCEDURES:  Critical Care performed: No  Procedures   MEDICATIONS ORDERED IN ED: Medications  clotrimazole (LOTRIMIN) 1 % cream (has no administration in time range)     IMPRESSION / MDM / ASSESSMENT AND PLAN / ED COURSE  I reviewed the triage vital signs and the nursing notes.  76 year old male with PMH as noted above presents with worsening generalized weakness over the last 1 to 2 days now resulting in him being unable to stand or pivot.  He has been nonambulatory for  some time.  On exam his vital signs are normal.  He has chronic appearing lymphedema.  There is an erythematous rash to the skin of the lower abdomen.  Neurologic exam is nonfocal.  Differential diagnosis includes, but is not limited to, dehydration, electrolyte abnormality, hyperglycemia, AKI, other metabolic disturbance, UTI or other infection, less likely cardiac cause.  There is no evidence of CNS etiology.  The lower abdominal rash appears consistent with candidal intertrigo.  There is no evidence of cellulitis.  We will obtain lab work-up, chest x-ray, respiratory panel, and reassess.  If the work-up is negative for acute findings the patient may require SNF placement if his current level of services at home is not adequate.  Patient's presentation is most consistent with acute presentation with potential threat to life or bodily function.  The patient is on the cardiac monitor to evaluate for evidence of arrhythmia and/or significant heart rate changes.  ----------------------------------------- 11:04 PM on 04/27/2022 -----------------------------------------  Chest x-ray shows no acute findings.  CBC shows no leukocytosis.  Other labs are pending.  I have signed the patient out to the oncoming ED physician Dr. Leonides Schanz.  If the work-up is negative for acute findings the patient likely will need TOC consultation for possible placement.   FINAL CLINICAL IMPRESSION(S) / ED DIAGNOSES   Final diagnoses:  Weakness     Rx / DC Orders   ED Discharge Orders     None        Note:  This document was prepared using Dragon voice recognition software and may include unintentional dictation errors.    Arta Silence, MD 04/27/22 [phone]

## 2022-04-28 DIAGNOSIS — I83029 Varicose veins of left lower extremity with ulcer of unspecified site: Secondary | ICD-10-CM | POA: Diagnosis present

## 2022-04-28 DIAGNOSIS — F32A Depression, unspecified: Secondary | ICD-10-CM | POA: Diagnosis present

## 2022-04-28 DIAGNOSIS — I83019 Varicose veins of right lower extremity with ulcer of unspecified site: Secondary | ICD-10-CM | POA: Diagnosis present

## 2022-04-28 DIAGNOSIS — R531 Weakness: Secondary | ICD-10-CM | POA: Diagnosis not present

## 2022-04-28 DIAGNOSIS — N390 Urinary tract infection, site not specified: Secondary | ICD-10-CM | POA: Diagnosis present

## 2022-04-28 LAB — GLUCOSE, CAPILLARY
Glucose-Capillary: 89 mg/dL (ref 70–99)
Glucose-Capillary: 109 mg/dL — ABNORMAL HIGH (ref 70–99)
Glucose-Capillary: 118 mg/dL — ABNORMAL HIGH (ref 70–99)

## 2022-04-28 LAB — RESP PANEL BY RT-PCR (FLU A&B, COVID) ARPGX2
Influenza A by PCR: NEGATIVE
Influenza B by PCR: NEGATIVE
SARS Coronavirus 2 by RT PCR: NEGATIVE

## 2022-04-28 LAB — URINALYSIS, ROUTINE W REFLEX MICROSCOPIC
Bilirubin Urine: NEGATIVE
Glucose, UA: NEGATIVE mg/dL
Ketones, ur: NEGATIVE mg/dL
Nitrite: NEGATIVE
Protein, ur: NEGATIVE mg/dL
Specific Gravity, Urine: 1.011 (ref 1.005–1.030)
pH: 6 (ref 5.0–8.0)

## 2022-04-28 MED ORDER — INSULIN GLARGINE-YFGN 100 UNIT/ML ~~LOC~~ SOLN
30.0000 [IU] | Freq: Every day | SUBCUTANEOUS | Status: DC
  Administered 2022-04-29: 30 [IU] via SUBCUTANEOUS
  Filled 2022-04-28 (×3): qty 0.3

## 2022-04-28 MED ORDER — ACETAMINOPHEN 325 MG PO TABS
650.0000 mg | ORAL_TABLET | Freq: Four times a day (QID) | ORAL | Status: DC | PRN
  Administered 2022-04-28: 650 mg via ORAL
  Filled 2022-04-28: qty 2

## 2022-04-28 MED ORDER — ATORVASTATIN CALCIUM 20 MG PO TABS
40.0000 mg | ORAL_TABLET | Freq: Every day | ORAL | Status: DC
  Administered 2022-04-28 – 2022-05-04 (×7): 40 mg via ORAL
  Filled 2022-04-28 (×7): qty 2

## 2022-04-28 MED ORDER — TRAZODONE HCL 50 MG PO TABS
75.0000 mg | ORAL_TABLET | Freq: Every day | ORAL | Status: DC
  Administered 2022-04-28 – 2022-05-04 (×7): 75 mg via ORAL
  Filled 2022-04-28 (×7): qty 2

## 2022-04-28 MED ORDER — SENNOSIDES-DOCUSATE SODIUM 8.6-50 MG PO TABS
1.0000 | ORAL_TABLET | Freq: Two times a day (BID) | ORAL | Status: DC
  Administered 2022-04-29 – 2022-05-05 (×13): 1 via ORAL
  Filled 2022-04-28 (×14): qty 1

## 2022-04-28 MED ORDER — DOCUSATE SODIUM 50 MG/5ML PO LIQD
50.0000 mg | Freq: Two times a day (BID) | ORAL | Status: DC
  Administered 2022-04-29 – 2022-05-03 (×9): 50 mg via ORAL
  Filled 2022-04-28 (×14): qty 10

## 2022-04-28 MED ORDER — ASPIRIN 325 MG PO TBEC
325.0000 mg | DELAYED_RELEASE_TABLET | Freq: Every day | ORAL | Status: DC
  Administered 2022-04-28 – 2022-05-05 (×8): 325 mg via ORAL
  Filled 2022-04-28 (×8): qty 1

## 2022-04-28 MED ORDER — INSULIN ASPART 100 UNIT/ML IJ SOLN
0.0000 [IU] | Freq: Three times a day (TID) | INTRAMUSCULAR | Status: DC
  Administered 2022-04-29 – 2022-04-30 (×4): 2 [IU] via SUBCUTANEOUS
  Administered 2022-05-01 – 2022-05-02 (×2): 3 [IU] via SUBCUTANEOUS
  Administered 2022-05-03 – 2022-05-04 (×3): 2 [IU] via SUBCUTANEOUS
  Administered 2022-05-05: 3 [IU] via SUBCUTANEOUS
  Filled 2022-04-28 (×10): qty 1

## 2022-04-28 MED ORDER — ACETAMINOPHEN 650 MG RE SUPP: 650.0000 mg | Freq: Four times a day (QID) | RECTAL | Status: DC | PRN

## 2022-04-28 MED ORDER — ONDANSETRON HCL 4 MG/2ML IJ SOLN
4.0000 mg | Freq: Four times a day (QID) | INTRAMUSCULAR | Status: DC | PRN
  Administered 2022-04-29: 4 mg via INTRAVENOUS
  Filled 2022-04-28: qty 2

## 2022-04-28 MED ORDER — GABAPENTIN 300 MG PO CAPS
300.0000 mg | ORAL_CAPSULE | Freq: Two times a day (BID) | ORAL | Status: DC
  Administered 2022-04-28 – 2022-05-05 (×14): 300 mg via ORAL
  Filled 2022-04-28 (×14): qty 1

## 2022-04-28 MED ORDER — VITAMIN D 25 MCG (1000 UNIT) PO TABS
1000.0000 [IU] | ORAL_TABLET | Freq: Every day | ORAL | Status: DC
  Administered 2022-04-28 – 2022-05-05 (×8): 1000 [IU] via ORAL
  Filled 2022-04-28 (×8): qty 1

## 2022-04-28 MED ORDER — SODIUM CHLORIDE 0.9 % IV SOLN: 1.0000 g | INTRAVENOUS | Status: DC

## 2022-04-28 MED ORDER — GABAPENTIN 300 MG PO CAPS: 300.0000 mg | ORAL_CAPSULE | Freq: Two times a day (BID) | ORAL | Status: DC

## 2022-04-28 MED ORDER — SPIRONOLACTONE 12.5 MG HALF TABLET
12.5000 mg | ORAL_TABLET | Freq: Every day | ORAL | Status: DC
  Administered 2022-04-28 – 2022-05-05 (×7): 12.5 mg via ORAL
  Filled 2022-04-28 (×9): qty 1

## 2022-04-28 MED ORDER — ADULT MULTIVITAMIN W/MINERALS CH
1.0000 | ORAL_TABLET | Freq: Every day | ORAL | Status: DC
  Administered 2022-04-29 – 2022-05-05 (×7): 1 via ORAL
  Filled 2022-04-28 (×7): qty 1

## 2022-04-28 MED ORDER — PANTOPRAZOLE SODIUM 40 MG PO TBEC
40.0000 mg | DELAYED_RELEASE_TABLET | Freq: Every day | ORAL | Status: DC
  Administered 2022-04-28 – 2022-05-05 (×8): 40 mg via ORAL
  Filled 2022-04-28 (×8): qty 1

## 2022-04-28 MED ORDER — PRAZOSIN HCL 2 MG PO CAPS
3.0000 mg | ORAL_CAPSULE | Freq: Every day | ORAL | Status: DC
  Administered 2022-04-28 – 2022-05-04 (×7): 3 mg via ORAL
  Filled 2022-04-28 (×7): qty 1

## 2022-04-28 MED ORDER — SODIUM CHLORIDE 0.9 % IV SOLN
1.0000 g | Freq: Once | INTRAVENOUS | Status: AC
  Administered 2022-04-28: 1 g via INTRAVENOUS
  Filled 2022-04-28: qty 10

## 2022-04-28 MED ORDER — CARVEDILOL 25 MG PO TABS
25.0000 mg | ORAL_TABLET | Freq: Two times a day (BID) | ORAL | Status: DC
  Administered 2022-04-28 – 2022-05-05 (×13): 25 mg via ORAL
  Filled 2022-04-28 (×13): qty 1

## 2022-04-28 MED ORDER — ENOXAPARIN SODIUM 80 MG/0.8ML IJ SOSY
65.0000 mg | PREFILLED_SYRINGE | Freq: Every day | INTRAMUSCULAR | Status: DC
  Administered 2022-04-28 – 2022-05-04 (×7): 65 mg via SUBCUTANEOUS
  Filled 2022-04-28 (×7): qty 0.65

## 2022-04-28 MED ORDER — IPRATROPIUM-ALBUTEROL 0.5-2.5 (3) MG/3ML IN SOLN: 3.0000 mL | RESPIRATORY_TRACT | Status: DC | PRN

## 2022-04-28 MED ORDER — PENTOXIFYLLINE ER 400 MG PO TBCR
400.0000 mg | EXTENDED_RELEASE_TABLET | Freq: Three times a day (TID) | ORAL | Status: DC
  Administered 2022-04-28 – 2022-05-05 (×20): 400 mg via ORAL
  Filled 2022-04-28 (×23): qty 1

## 2022-04-28 MED ORDER — LEVOFLOXACIN 500 MG PO TABS
250.0000 mg | ORAL_TABLET | Freq: Every day | ORAL | Status: DC
  Administered 2022-04-28 – 2022-04-30 (×3): 250 mg via ORAL
  Filled 2022-04-28 (×3): qty 1

## 2022-04-28 MED ORDER — ONDANSETRON HCL 4 MG PO TABS: 4.0000 mg | ORAL_TABLET | Freq: Four times a day (QID) | ORAL | Status: DC | PRN

## 2022-04-28 MED ORDER — FUROSEMIDE 40 MG PO TABS
40.0000 mg | ORAL_TABLET | Freq: Every day | ORAL | Status: DC
  Administered 2022-04-28 – 2022-05-05 (×8): 40 mg via ORAL
  Filled 2022-04-28 (×8): qty 1

## 2022-04-28 MED ORDER — SERTRALINE HCL 50 MG PO TABS
100.0000 mg | ORAL_TABLET | Freq: Every day | ORAL | Status: DC
  Administered 2022-04-28 – 2022-05-05 (×8): 100 mg via ORAL
  Filled 2022-04-28 (×8): qty 2

## 2022-04-28 NOTE — Assessment & Plan Note (Addendum)
Blood pressure within goal. -Continue carvedilol, furosemide and spironolactone

## 2022-04-28 NOTE — H&P (Addendum)
History and Physical    Patient: Adam Merritt ELF:[id number] DOB: 13-Dec-1945 DOA: 04/27/2022 DOS: the patient was seen and examined on 04/28/2022 PCP: Jonetta Osgood, NP  Patient coming from: Home  Chief Complaint:  Chief Complaint  Patient presents with   Leg Swelling   HPI: KEYANTE DURIO is a 76 y.o. male with medical history significant for diabetes mellitus with complications of stage III chronic kidney disease, coronary artery disease, hypertension, chronic lymphedema, diabetic neuropathy, GERD, history of venous insufficiency with bilateral lower extremity ulcerations who presents to the ER for evaluation of generalized weakness. At baseline patient is wheelchair-bound and is able to transfer but over the last 4 days wife notes increased weakness and difficulty with transfers.  She also states that she has had to assist him with most of his ADLs in the last several days.  She also states that due to his difficulty with ambulation he now has pressure ulcers on his buttocks. During my evaluation at rest patient has tremors at rest which his wife states is due and started in the last 1 month.  Does not have a diagnosis of Parkinson's disease and according to the wife the [place] was supposed to be referring him to a neurologist but that has not happened. Patient denies having any fever or chills, no chest pain, no shortness of breath, no nausea, no vomiting, no headache, no dizziness, no lightheadedness, no blurred vision or any focal deficit Labs show some pyuria.  Renal function is stable with a serum creatinine of 1.92 He will be admitted to the hospital for further evaluation    Review of Systems: As mentioned in the history of present illness. All other systems reviewed and are negative. Past Medical History:  Diagnosis Date   Anemia    CAD (coronary artery disease)    Chest pain    Coronary artery disease    Diabetes mellitus without complication (HCC)    Diastolic CHF ([place])     Esophageal reflux    Herpes zoster without mention of complication    Hyperlipidemia    Hypertension    Insomnia    Lumbago    Lymphedema    Neuropathy in diabetes ([place])    Obstructive chronic bronchitis without exacerbation    Other malaise and fatigue    Stroke ([place])    Varicose veins    Past Surgical History:  Procedure Laterality Date   AMPUTATION TOE Right 06/23/2018   Procedure: AMPUTATION TOE;  Surgeon: Sharlotte Alamo, DPM;  Location: [facility] ORS;  Service: Podiatry;  Laterality: Right;   CHOLECYSTECTOMY     CORONARY ARTERY BYPASS GRAFT     Social History:  reports that he has never smoked. He has never used smokeless tobacco. He reports that he does not drink alcohol and does not use drugs.  No Known Allergies  Family History  Problem Relation Age of Onset   Diabetes Mother    Hyperlipidemia Mother    Hypertension Mother    Diabetes Father    Hyperlipidemia Father    Hypertension Father    CAD Father     Prior to Admission medications   Medication Sig Start Date End Date Taking? Authorizing Provider  albuterol (VENTOLIN HFA) 108 (90 Base) MCG/ACT inhaler Inhale 2 puffs into the lungs every 6 (six) hours as needed for wheezing or shortness of breath. 02/09/21  Yes Abernathy, Yetta Flock, NP  aspirin 325 MG EC tablet Take 325 mg by mouth daily.   Yes [provider]  atorvastatin (LIPITOR) 40 MG tablet Take 40 mg by mouth at bedtime.    Yes [provider]  carvedilol (COREG) 25 MG tablet Take 25 mg by mouth 2 (two) times daily with a meal.   Yes [provider]  docusate sodium (COLACE) 50 MG capsule Take 50 mg by mouth 2 (two) times daily.   Yes [provider]  furosemide (LASIX) 40 MG tablet TAKE 1 TABLET(40 MG) BY MOUTH DAILY 04/13/22  Yes Abernathy, Alyssa, NP  gabapentin (NEURONTIN) 300 MG capsule TAKE 2 CAPSULES BY MOUTH EVERY MORNING, 1 CAPSULE EVERY EVENING AND 2 CAPSULES AT NIGHT 09/12/21  Yes Abernathy, Alyssa, NP  Insulin Glargine  (BASAGLAR KWIKPEN) 100 UNIT/ML Inject 30 Units into the skin daily. 04/10/22  Yes Abernathy, Alyssa, NP  ipratropium-albuterol (DUONEB) 0.5-2.5 (3) MG/3ML SOLN Take 3 mLs by nebulization every 4 (four) hours as needed (for shortness of breath). 06/15/21  Yes Merlyn Lot, MD  liraglutide (VICTOZA) 18 MG/3ML SOPN INJECT 1.'8MG'$  INTO THE SKIN DAILY 06/22/21  Yes Abernathy, Alyssa, NP  mometasone (ELOCON) 0.1 % lotion Apply three times weekly as directed. 05/23/21  Yes Ralene Bathe, MD  Multiple Vitamin (MULTIVITAMIN WITH MINERALS) TABS tablet Take 1 tablet by mouth daily.   Yes [provider]  omeprazole (PRILOSEC) 20 MG capsule Take 20 mg by mouth daily. 02/08/22  Yes [provider]  pentoxifylline (TRENTAL) 400 MG CR tablet TAKE 1 TABLET BY MOUTH THREE TIMES DAILY FOR CLAUDICATION 10/05/21  Yes Abernathy, Alyssa, NP  potassium chloride SA (KLOR-CON M) 20 MEQ tablet TAKE 1 TABLET BY MOUTH TWICE DAILY 02/04/22  Yes [name], MD  prazosin (MINIPRESS) 1 MG capsule TAKE THREE CAPSULES BY MOUTH EVERY EVENING FOR NIGHTMARES 07/01/20  Yes [provider]  sertraline (ZOLOFT) 100 MG tablet Take 100 mg by mouth daily.  10/17/18  Yes [provider]  spironolactone (ALDACTONE) 25 MG tablet Take 12.5 mg by mouth daily.   Yes [provider]  traZODone (DESYREL) 150 MG tablet Take 75 mg by mouth at bedtime.   Yes [provider]  triamcinolone cream (KENALOG) 0.1 % Apply 1 application topically 2 (two) times daily. 12/02/20  Yes Abernathy, Yetta Flock, NP  vitamin D3 (CHOLECALCIFEROL) 25 MCG tablet Take 1,000 Units by mouth daily.   Yes [provider]  azithromycin (ZITHROMAX) 250 MG tablet Take 1 tablet (250 mg total) by mouth daily. Use as directed for 5 days Patient not taking: Reported on 04/28/2022 12/23/21   Mylinda Latina, PA-C  Continuous Blood Gluc Sensor (FREESTYLE LIBRE 2 SENSOR) MISC 1 Device by Does not apply route every 14 (fourteen)  days. 03/21/21   Jonetta Osgood, NP  febuxostat (ULORIC) 40 MG tablet Take 1 tablet (40 mg total) by mouth daily. Patient not taking: Reported on 04/28/2022 03/17/19   Ronnell Freshwater, NP  FEROSUL 325 (65 Fe) MG tablet Take 325 mg by mouth daily with breakfast. Patient not taking: Reported on 04/28/2022 03/10/22   [provider]  fluticasone (FLOVENT HFA) 110 MCG/ACT inhaler Take 2 puffs in am and pm  for copd // chronic bronchitis ( rinse mouth) Patient not taking: Reported on 04/28/2022 03/18/20   [name], MD  glucose blood test strip Use as instructed to check blood sugars twice daily 02/23/20   [name], MD  Insulin Pen Needle (BD PEN NEEDLE NANO U/F) 32G X 4 MM MISC Use as directed with insulin DX e11.65 01/10/21   [name], MD  ondansetron (ZOFRAN) 4 MG tablet Take 1 tablet (4 mg total) by mouth every 8 (eight) hours as needed. Patient not taking: Reported on 04/28/2022 02/15/19   Nance Pear, MD  OneTouch Delica Lancets 93O MISC Use twice daily diag e11.65 02/23/20   [name], MD  predniSONE (DELTASONE) 10 MG tablet Take one tab 3 x day for 3 days, then take one tab 2 x a day for 3 days and then take one tab a day for 3 days for copd Patient not taking: Reported on 04/28/2022 06/21/21   Jonetta Osgood, NP  sennosides-docusate sodium (SENOKOT-S) 8.6-50 MG tablet Take 1-2 tablets by mouth 2 (two) times daily.    [provider]  tiZANidine (ZANAFLEX) 2 MG tablet Take 1 tablet (2 mg total) by mouth 3 (three) times daily. Patient not taking: Reported on 04/28/2022 06/02/20   Luiz Ochoa, NP    Physical Exam: Vitals:   04/28/22 0030 04/28/22 0623 04/28/22 0744 04/28/22 1029  BP: 125/62 137/71  (!) 159/76  Pulse: 89 82 83 80  Resp: (!) 37 '18 18 20  '$ Temp:  98.3 F (36.8 C)  99.1 F (37.3 C)  TempSrc:  Oral  Oral  SpO2: 93% 94% 100% 92%  Weight:      Height:       Physical Exam Vitals and nursing note reviewed.  Constitutional:       Appearance: He is obese.  HENT:     Head: Normocephalic and atraumatic.     Nose: Nose normal.     Mouth/Throat:     Mouth: Mucous membranes are moist.  Eyes:     Conjunctiva/sclera: Conjunctivae normal.  Cardiovascular:     Rate and Rhythm: Normal rate and regular rhythm.  Pulmonary:     Effort: Pulmonary effort is normal.     Breath sounds: Normal breath sounds.  Abdominal:     General: Bowel sounds are normal.     Palpations: Abdomen is soft.     Comments: Central adiposity  Musculoskeletal:     Cervical back: Normal range of motion and neck supple.     Right lower leg: Edema present.     Left lower leg: Edema present.     Comments: Tremors at rest  Skin:    General: Skin is warm and dry.  Neurological:     Mental Status: He is alert.     Motor: Weakness present.  Psychiatric:        Mood and Affect: Mood normal.        Behavior: Behavior normal.     Data Reviewed: Relevant notes from primary care and specialist visits, past discharge summaries as available in EHR, including Care Everywhere. Prior diagnostic testing as pertinent to current admission diagnoses Updated medications and problem lists for reconciliation ED course, including vitals, labs, imaging, treatment and response to treatment Triage notes, nursing and pharmacy notes and ED provider's notes Notable results as noted in HPI Labs reviewed.  Sodium 139, potassium 3.9, chloride 104, bicarb 26, glucose 114, BUN 19, creatinine 1.92, calcium 8.2, total protein 6.7, albumin 2.1, AST 15, ALT 8, alkaline phosphatase 77, total bilirubin 0.9, TSH 2.3, white count 10.0, hemoglobin 11.4, hematocrit 34.7, platelet count 164 Chest x-ray reviewed by me shows no evidence of any acute findings There are no new results to review at this time.  Assessment and Plan: * General weakness Patient presents to the ER for evaluation of generalized weakness and recurrent falls at home. Per wife he is  currently wheelchair-bound  and is unable to transfer. He is requiring increased assistance with ADLs We will request PT evaluation  CKD stage 3 due to type 2 diabetes mellitus ([place]) Patient has diabetes mellitus with complications of stage III chronic kidney disease which is stable Continue long-acting insulin Maintain consistent carbohydrate diet Glycemic control with sliding scale insulin  UTI (urinary tract infection) Patient noted to have pyuria which may be contributing to his weakness However urine culture yielded Enterococcus faecalis sensitive to quinolones, penicillin and vancomycin We will treat patient with Levaquin adjusted to his renal function  Depression Continue Zoloft and trazodone  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 ([place]) Patient has chronic lower extremity venous stasis with ulceration We will consult wound care  Recurrent falls Secondary to generalized weakness Patient on fall precautions PT evaluate and treat  COPD (chronic obstructive pulmonary disease) ([place]) Stable and not acutely exacerbated Continue as needed bronchodilator therapy and inhaled steroids  Essential hypertension Continue carvedilol  Chronic diastolic CHF (congestive heart failure) (HCC) Stable and not acutely exacerbated Continue furosemide, spironolactone and carvedilol      Advance Care Planning:   Code Status: Full Code   Consults: Physical therapy  Family Communication: Greater than 50% of time was spent discussing patient's condition and plan of care with him and his wife at the bedside.  All questions and concerns have been addressed.  They verbalized understanding and agree with the plan.  CODE STATUS was discussed and he is a full code.  Severity of Illness: The appropriate patient status for this patient is INPATIENT. Inpatient status is judged to be reasonable and necessary in order to provide the required intensity of service to ensure the patient's safety. The patient's presenting  symptoms, physical exam findings, and initial radiographic and laboratory data in the context of their chronic comorbidities is felt to place them at high risk for further clinical deterioration. Furthermore, it is not anticipated that the patient will be medically stable for discharge from the hospital within 2 midnights of admission.   * I certify that at the point of admission it is my clinical judgment that the patient will require inpatient hospital care spanning beyond 2 midnights from the point of admission due to high intensity of service, high risk for further deterioration and high frequency of surveillance required.*  Author: Collier Bullock, MD 04/28/2022 11:01 AM  For on call review [email or web address].

## 2022-04-28 NOTE — ED Notes (Signed)
Informed RN bed assigned 

## 2022-04-28 NOTE — ED Notes (Signed)
Patient transitioned to hospital bed to promote comfort. Pt cleansed as well and clean gown and linens applied

## 2022-04-28 NOTE — TOC Progression Note (Signed)
Transition of Care [facility]) - Progression Note    Patient Details  Name: Adam Merritt MRN: [id number] Date of Birth: 1945/12/18  Transition of Care [facility]) CM/SW [place], RN Phone Number: 04/28/2022, 2:31 PM  Clinical Narrative:    Patient from home with his wife the patient was seen in the [facility] on 04/25/22 and had compression wraps placed.  These  are to be left in place and They are to be changed weekly.  The next change date is 05/02/22.   TOC TO follow for DC planning and needs  Expected Discharge Plan: [place] Barriers to Discharge: Continued Medical Work up  Expected Discharge Plan and Services Expected Discharge Plan: [place]   Discharge Planning Services: CM Consult   Living arrangements for the past 2 months: Single Family Home                                       Social Determinants of Health (SDOH) Interventions    Readmission Risk Interventions     No data to display

## 2022-04-28 NOTE — Assessment & Plan Note (Addendum)
Patient presents to the ER for evaluation of generalized weakness and recurrent falls at home. Per wife he is currently wheelchair-bound and is unable to transfer. He is requiring increased assistance with ADLs PT is recommending SNF-no bed offer yet

## 2022-04-28 NOTE — Consult Note (Signed)
WOC Nurse Consult Note: Patient receiving care in [facility] ED 18HA. Reason for Consult: venous stasis ulcers It appears in the patient record that the patient was seen in the [facility] on 04/25/22 and had compression wraps placed.  The paramedic, Les, has confirmed the patient does have on BLE compression wraps.  I have placed an order to show every shift, to leave these in place and there are to be changed weekly.  The next change date is 05/02/22.  I have placed the patient on the f/u list and a WOC will see him on 05/02/22 if he remains in hospital.  Val Riles, RN, MSN, [facility], CNS-BC, pager [phone]

## 2022-04-28 NOTE — Assessment & Plan Note (Addendum)
Patient has chronic lower extremity venous stasis with ulceration Wound care was also consulted. -Change Haematologist

## 2022-04-28 NOTE — Assessment & Plan Note (Addendum)
Patient noted to have pyuria which may be contributing to his weakness However urine culture yielded Enterococcus faecalis sensitive to quinolones, penicillin and vancomycin Patient initially received Levaquin which was changed to amoxicillin 500 mg every 8 hourly to complete a 5-day course

## 2022-04-28 NOTE — Assessment & Plan Note (Signed)
Stable and not acutely exacerbated Continue furosemide, spironolactone and carvedilol

## 2022-04-28 NOTE — Assessment & Plan Note (Signed)
Continue Zoloft and trazodone

## 2022-04-28 NOTE — Assessment & Plan Note (Signed)
Stable and not acutely exacerbated Continue as needed bronchodilator therapy and inhaled steroids 

## 2022-04-28 NOTE — Assessment & Plan Note (Addendum)
Patient has diabetes mellitus with complications of stage IIIB chronic kidney disease which is stable- -monitor renal function -Avoid left toxins

## 2022-04-28 NOTE — Assessment & Plan Note (Addendum)
Secondary to generalized weakness Patient on fall precautions PT evaluated him and recommending SNF

## 2022-04-28 NOTE — Consult Note (Signed)
Pharmacy Antibiotic Note  76 year old male admitted with complaints of weakness and pyuria. Per H&P, recent urine culture growing E faecalis sensitive to quinolones, penicillins, and vancomycin. Pharmacy has been consulted for levofloxacin dosing.  Plan: Levofloxacin '250mg'$  by mouth daily Discontinue ceftriaxone given antibiotic resistance patterns in E faecalis spp. Continue to monitor and dose adjust antibiotics according to renal function and indication   Height: '5\' 6"'$  (167.6 cm) Weight: 129.7 kg (286 lb) IBW/kg (Calculated) : 63.8  Temp (24hrs), Avg:98.7 F (37.1 C), Min:98.2 F (36.8 C), Max:99.2 F (37.3 C)  Recent Labs  Lab 04/27/22 2233  WBC 10.0  CREATININE 1.92*    Estimated Creatinine Clearance: 41.1 mL/min (A) (by C-G formula based on SCr of 1.92 mg/dL (H)).    No Known Allergies  Antimicrobials this admission: 11/24 Ceftriaxone >> 11/24 11/24 Levofloxacin >>   Microbiology results: 11/24 UCx: sent  11/24 Resp panel RT PCR: Negative  Thank you for allowing pharmacy to be a part of this patient's care.  Darrick Penna 04/28/2022 12:16 PM

## 2022-04-29 ENCOUNTER — Observation Stay: Payer: Medicare PPO

## 2022-04-29 DIAGNOSIS — R531 Weakness: Secondary | ICD-10-CM | POA: Diagnosis not present

## 2022-04-29 DIAGNOSIS — M25561 Pain in right knee: Secondary | ICD-10-CM | POA: Diagnosis not present

## 2022-04-29 LAB — CBC
HCT: 33 % — ABNORMAL LOW (ref 39.0–52.0)
Hemoglobin: 10.9 g/dL — ABNORMAL LOW (ref 13.0–17.0)
MCH: 29.4 pg (ref 26.0–34.0)
MCHC: 33 g/dL (ref 30.0–36.0)
MCV: 88.9 fL (ref 80.0–100.0)
Platelets: 137 10*3/uL — ABNORMAL LOW (ref 150–400)
RBC: 3.71 MIL/uL — ABNORMAL LOW (ref 4.22–5.81)
RDW: 13.4 % (ref 11.5–15.5)
WBC: 7.8 10*3/uL (ref 4.0–10.5)
nRBC: 0 % (ref 0.0–0.2)

## 2022-04-29 LAB — BASIC METABOLIC PANEL
Anion gap: 8 (ref 5–15)
BUN: 20 mg/dL (ref 8–23)
CO2: 25 mmol/L (ref 22–32)
Calcium: 8.4 mg/dL — ABNORMAL LOW (ref 8.9–10.3)
Chloride: 106 mmol/L (ref 98–111)
Creatinine, Ser: 1.78 mg/dL — ABNORMAL HIGH (ref 0.61–1.24)
GFR, Estimated: 39 mL/min — ABNORMAL LOW (ref >60)
Glucose, Bld: 108 mg/dL — ABNORMAL HIGH (ref 70–99)
Potassium: 3.8 mmol/L (ref 3.5–5.1)
Sodium: 139 mmol/L (ref 135–145)

## 2022-04-29 LAB — GLUCOSE, CAPILLARY
Glucose-Capillary: 118 mg/dL — ABNORMAL HIGH (ref 70–99)
Glucose-Capillary: 135 mg/dL — ABNORMAL HIGH (ref 70–99)
Glucose-Capillary: 134 mg/dL — ABNORMAL HIGH (ref 70–99)
Glucose-Capillary: 110 mg/dL — ABNORMAL HIGH (ref 70–99)

## 2022-04-29 LAB — IRON AND TIBC
Iron: 26 ug/dL — ABNORMAL LOW (ref 45–182)
Saturation Ratios: 13 % — ABNORMAL LOW (ref 17.9–39.5)
TIBC: 200 ug/dL — ABNORMAL LOW (ref 250–450)
UIBC: 174 ug/dL

## 2022-04-29 LAB — VITAMIN D 25 HYDROXY (VIT D DEFICIENCY, FRACTURES): Vit D, 25-Hydroxy: 41.85 ng/mL (ref 30–100)

## 2022-04-29 LAB — HEMOGLOBIN A1C
Hgb A1c MFr Bld: 6.8 % — ABNORMAL HIGH (ref 4.8–5.6)
Mean Plasma Glucose: 148 mg/dL

## 2022-04-29 LAB — FOLATE: Folate: 7.9 ng/mL (ref >5.9)

## 2022-04-29 LAB — VITAMIN B12: Vitamin B-12: 196 pg/mL (ref 180–914)

## 2022-04-29 MED ORDER — VITAMIN C 500 MG PO TABS
500.0000 mg | ORAL_TABLET | Freq: Every day | ORAL | Status: DC
  Administered 2022-04-29 – 2022-05-05 (×7): 500 mg via ORAL
  Filled 2022-04-29 (×7): qty 1

## 2022-04-29 MED ORDER — FOLIC ACID 1 MG PO TABS
1.0000 mg | ORAL_TABLET | Freq: Every day | ORAL | Status: DC
  Administered 2022-04-29 – 2022-05-05 (×7): 1 mg via ORAL
  Filled 2022-04-29 (×7): qty 1

## 2022-04-29 MED ORDER — POLYSACCHARIDE IRON COMPLEX 150 MG PO CAPS
150.0000 mg | ORAL_CAPSULE | Freq: Every day | ORAL | Status: DC
  Administered 2022-04-29 – 2022-05-05 (×7): 150 mg via ORAL
  Filled 2022-04-29 (×7): qty 1

## 2022-04-29 NOTE — Progress Notes (Signed)
[facility] Progress Note  Patient: Adam Merritt    OEV:[id number]  DOA: 04/27/2022     Date of Service: the patient was seen and examined on 04/29/2022  Chief Complaint  Patient presents with   Leg Swelling   Brief hospital course: Adam Merritt is a 76 y.o. male with medical history significant for diabetes mellitus with complications of stage III chronic kidney disease, coronary artery disease, hypertension, chronic lymphedema, diabetic neuropathy, GERD, history of venous insufficiency with bilateral lower extremity ulcerations who presents to the ER for evaluation of generalized weakness. At baseline patient is wheelchair-bound and is able to transfer but over the last 4 days wife notes increased weakness and difficulty with transfers.  She also states that she has had to assist him with most of his ADLs in the last several days.  She also states that due to his difficulty with ambulation he now has pressure ulcers on his buttocks. During my evaluation at rest patient has tremors at rest which his wife states is due and started in the last 1 month.  Does not have a diagnosis of Parkinson's disease and according to the wife the [place] was supposed to be referring him to a neurologist but that has not happened. Patient denies having any fever or chills, no chest pain, no shortness of breath, no nausea, no vomiting, no headache, no dizziness, no lightheadedness, no blurred vision or any focal deficit Labs show some pyuria.  Renal function is stable with a serum creatinine of 1.92 He will be admitted to the hospital for further evaluation    Assessment and Plan: * General weakness Patient presents to the ER for evaluation of generalized weakness and recurrent falls at home. Per wife he is currently wheelchair-bound and is unable to transfer. He is requiring increased assistance with ADLs PT eval done, recommend SNF placement TOC following    Right knee pain Follow right knee x-ray to  rule out effusion May need orthopedic consult for right knee arthrocentesis   CKD stage 3 due to type 2 diabetes mellitus ([place]) Patient has diabetes mellitus with complications of stage III chronic kidney disease which is stable Continue long-acting insulin Maintain consistent carbohydrate diet Glycemic control with sliding scale insulin   UTI (urinary tract infection) Patient noted to have pyuria which may be contributing to his weakness However urine culture yielded Enterococcus faecalis sensitive to quinolones, penicillin and vancomycin Continue with Levaquin adjusted to his renal function Follow urine culture growing Enterococcus faecalis, follow sensitivity report  Depression Continue Zoloft and trazodone   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 ([place]) Patient has chronic lower extremity venous stasis with ulceration We will consult wound care   Recurrent falls Secondary to generalized weakness Patient on fall precautions PT evaluate and treat   COPD (chronic obstructive pulmonary disease) ([place]) Stable and not acutely exacerbated Continue as needed bronchodilator therapy and inhaled steroids   Essential hypertension Continue carvedilol   Chronic diastolic CHF (congestive heart failure) (HCC) Stable and not acutely exacerbated Continue furosemide, spironolactone and carvedilol   Anemia secondary to iron deficiency Iron level 26, saturation ratio is 13%, started oral supplement. Folic acid level 7.9, at lower end, started folic acid supplement.   Body mass index is 46.16 kg/m.  Interventions:       Diet: Carb modified DVT Prophylaxis: Subcutaneous Lovenox   Advance goals of care discussion: Full code  Family Communication: family was present at bedside, at the time of interview.  The pt provided  permission to discuss medical plan with the family. Opportunity was given to ask question and all questions were answered satisfactorily.   Disposition:   Pt is from Home, admitted with right weakness, still has weakness, which precludes a safe discharge. Discharge to SNF, when bed will be available.  Subjective: No significant overnight events, patient still has generalized weakness, deconditioning for past few weeks.  Patient is wheelchair-bound but progressively getting worse.  Denies any active issues, no chest pain or palpitation no shortness of breath.  Physical Exam: General:  alert, NAD.  Appear in mild distress, affect appropriate Eyes: PERRLA ENT: Oral Mucosa Clear, moist  Neck: no JVD,  Cardiovascular: S1 and S2 Present, no Murmur,  Respiratory: good respiratory effort, Bilateral Air entry equal and Decreased, no Crackles, no wheezes Abdomen: Bowel Sound present, Soft and no tenderness,  Skin: no rashes Extremities: 3-4+ Pedal edema, chronic lymphedema, and straps intact, no calf tenderness Neurologic: without any new focal findings Gait not checked due to patient safety concerns  Vitals:   04/28/22 1522 04/28/22 2132 04/29/22 0012 04/29/22 0802  BP: (!) 143/73 (!) 152/74 (!) 119/59 (!) 134/123  Pulse: 81 99 98 80  Resp: 16  16   Temp: 98.3 F (36.8 C)  98.8 F (37.1 C) 98.1 F (36.7 C)  TempSrc:   Oral   SpO2: 96%  98% 96%  Weight:      Height:        Intake/Output Summary (Last 24 hours) at 04/29/2022 1339 Last data filed at 04/28/2022 2013 Gross per 24 hour  Intake --  Output 200 ml  Net -200 ml   Filed Weights   04/27/22 2221  Weight: 129.7 kg    Data Reviewed: I have personally reviewed and interpreted daily labs, tele strips, imagings as discussed above. I reviewed all nursing notes, pharmacy notes, vitals, pertinent old records I have discussed plan of care as described above with RN and patient/family.  CBC: Recent Labs  Lab 04/27/22 2233 04/29/22 0607  WBC 10.0 7.8  NEUTROABS 7.8*  --   HGB 11.4* 10.9*  HCT 34.7* 33.0*  MCV 90.8 88.9  PLT 164 774*   Basic Metabolic Panel: Recent Labs   Lab 04/27/22 2233 04/29/22 0607  NA 139 139  K 3.9 3.8  CL 104 106  CO2 26 25  GLUCOSE 114* 108*  BUN 19 20  CREATININE 1.92* 1.78*  CALCIUM 8.2* 8.4*    Studies: No results found.  Scheduled Meds:  aspirin EC  325 mg Oral Daily   atorvastatin  40 mg Oral QHS   carvedilol  25 mg Oral BID WC   vitamin D3  1,000 Units Oral Daily   clotrimazole   Topical BID   docusate  50 mg Oral BID   enoxaparin (LOVENOX) injection  65 mg Subcutaneous QHS   furosemide  40 mg Oral Daily   gabapentin  300 mg Oral BID   insulin aspart  0-15 Units Subcutaneous TID WC   insulin glargine-yfgn  30 Units Subcutaneous Daily   levofloxacin  250 mg Oral Daily   multivitamin with minerals  1 tablet Oral Daily   pantoprazole  40 mg Oral Daily   pentoxifylline  400 mg Oral TID WC   prazosin  3 mg Oral QHS   senna-docusate  1 tablet Oral BID   sertraline  100 mg Oral Daily   spironolactone  12.5 mg Oral Daily   traZODone  75 mg Oral QHS   Continuous Infusions: PRN Meds:  acetaminophen **OR** acetaminophen, ipratropium-albuterol, ondansetron **OR** ondansetron (ZOFRAN) IV  Time spent: 35 minutes  Author: Val Riles. MD [facility] 04/29/2022 1:39 PM  To reach On-call, see care teams to locate the attending and reach out to them via [email or web address]. If 7PM-7AM, please contact night-coverage If you still have difficulty reaching the attending provider, please page the [facility] (Director on Call) for [facility] on amion for assistance.

## 2022-04-29 NOTE — Evaluation (Signed)
Physical Therapy Evaluation Patient Details Name: Adam Merritt MRN: [id number] DOB: Feb 26, 1946 Today's Date: 04/29/2022  History of Present Illness  Pt is a 76 y/o male admitted secondary to generalized weakness, UTI and recurrent falls at home. PMH including but not limited to diabetes mellitus with complications of stage III chronic kidney disease, coronary artery disease, hypertension, chronic lymphedema, diabetic neuropathy, GERD, history of venous insufficiency with bilateral lower extremity ulcerations.   Clinical Impression  Pt presented supine in bed with HOB elevated, initially asleep and remained lethargic throughout but willing to participate in therapy session. His wife was present throughout as well. Prior to admission, pt reported that he was requiring more and more physical assistance from his wife for functional mobility, transfers and ADLs/IADLs. His wife stated that he uses a w/c as his primary means of mobility and requires someone else to propel him. She also stated that he has been requiring heavy physical assistance from her for transfers and self-care tasks, such as LB bathing and dressing. At the time of evaluation, pt significantly limited with mobility secondary to fatigue, lethargy and pain. He required max A to complete bed mobility and to achieve a sitting position at EOB; however, could not maintain a sitting position for more than a few seconds and required the support of the [facility] and railing. PT recommending that pt d/c to a SNF at this time prior to returning home with wife's support. Pt would continue to benefit from skilled physical therapy services at this time while admitted and after d/c to address the below listed limitations in order to improve overall safety and independence with functional mobility.      Recommendations for follow up therapy are one component of a multi-disciplinary discharge planning process, led by the attending physician.  Recommendations may  be updated based on patient status, additional functional criteria and insurance authorization.  Follow Up Recommendations Skilled nursing-short term rehab (<3 hours/day) Can patient physically be transported by private vehicle: No    Assistance Recommended at Discharge Frequent or constant Supervision/Assistance  Patient can return home with the following  Two people to help with walking and/or transfers;Two people to help with bathing/dressing/bathroom;Assistance with cooking/housework;Assistance with feeding;Direct supervision/assist for medications management;Assist for transportation;Help with stairs or ramp for entrance    Equipment Recommendations None recommended by PT  Recommendations for Other Services       Functional Status Assessment Patient has had a recent decline in their functional status and demonstrates the ability to make significant improvements in function in a reasonable and predictable amount of time.     Precautions / Restrictions Precautions Precautions: Fall Restrictions Weight Bearing Restrictions: No      Mobility  Bed Mobility Overal bed mobility: Needs Assistance Bed Mobility: Supine to Sit     Supine to sit: Max assist, HOB elevated     General bed mobility comments: HOB significantly elevated, pt able to use bed rails to assist, pt requiring heavy physical assistance with bilateral LE management off of and back onto bed    Transfers                   General transfer comment: unable to tolerate this date due to increased pain and lethargy    Ambulation/Gait                  Stairs            Wheelchair Mobility    Modified Rankin (Stroke Patients Only)  Balance Overall balance assessment: Needs assistance, History of Falls Sitting-balance support: Feet supported, Bilateral upper extremity supported, Single extremity supported Sitting balance-Leahy Scale: Poor                                        Pertinent Vitals/Pain Pain Assessment Pain Assessment: Faces Faces Pain Scale: Hurts even more Pain Location: bilateral LEs Pain Descriptors / Indicators: Grimacing, Guarding Pain Intervention(s): Monitored during session, Repositioned, Premedicated before session    Home Living Family/patient expects to be discharged to:: Private residence Living Arrangements: Spouse/significant other Available Help at Discharge: Family Type of Home: House Home Access: Level entry       Home Layout: One level Home Equipment: Conservation officer, nature (2 wheels);Wheelchair - manual;Shower seat      Prior Function Prior Level of Function : Needs assist       Physical Assist : Mobility (physical);ADLs (physical) Mobility (physical): Transfers;Gait ADLs (physical): Bathing;Dressing;Toileting;IADLs Mobility Comments: pt was previously able to complete transfers without physical assistance, but for the past several weeks he has become progressively more weak and requiring lots of physical help from his wife to transfer into/out of his w/c. He is unable to propel himself once in the w/c ADLs Comments: wife assists with LB dressing and bathing     Hand Dominance        Extremity/Trunk Assessment   Upper Extremity Assessment Upper Extremity Assessment: Defer to OT evaluation    Lower Extremity Assessment Lower Extremity Assessment: RLE deficits/detail;LLE deficits/detail RLE Deficits / Details: pt with wound care wraps donned to bilateral lower legs and feet; pt reporting increased pain with movement, particularly with knee flexion RLE: Unable to fully assess due to pain LLE Deficits / Details: pt with wound care wraps donned to bilateral lower legs and feet; pt reporting increased pain with movement, particularly with knee flexion LLE: Unable to fully assess due to pain       Communication   Communication: No difficulties  Cognition Arousal/Alertness: Lethargic Behavior During  Therapy: WFL for tasks assessed/performed Overall Cognitive Status: Difficult to assess                                          General Comments      Exercises     Assessment/Plan    PT Assessment Patient needs continued PT services  PT Problem List Decreased strength;Decreased range of motion;Decreased activity tolerance;Decreased mobility;Decreased balance;Decreased coordination;Decreased knowledge of use of DME;Decreased safety awareness;Decreased knowledge of precautions;Pain       PT Treatment Interventions DME instruction;Gait training;Functional mobility training;Therapeutic activities;Therapeutic exercise;Balance training;Neuromuscular re-education;Patient/family education    PT Goals (Current goals can be found in the Care Plan section)  Acute Rehab PT Goals Patient Stated Goal: to get stronger PT Goal Formulation: With patient/family Time For Goal Achievement: 05/13/22 Potential to Achieve Goals: Fair    Frequency Min 2X/week     Co-evaluation               AM-PAC PT "6 Clicks" Mobility  Outcome Measure Help needed turning from your back to your side while in a flat bed without using bedrails?: A Lot Help needed moving from lying on your back to sitting on the side of a flat bed without using bedrails?: Total Help needed moving to and from a bed  to a chair (including a wheelchair)?: Total Help needed standing up from a chair using your arms (e.g., wheelchair or bedside chair)?: Total Help needed to walk in hospital room?: Total Help needed climbing 3-5 steps with a railing? : Total 6 Click Score: 7    End of Session Equipment Utilized During Treatment: Oxygen Activity Tolerance: Patient limited by pain;Patient limited by lethargy;Patient limited by fatigue Patient left: in bed;with call bell/phone within reach;with bed alarm set;with family/visitor present Nurse Communication: Mobility status PT Visit Diagnosis: Other abnormalities of  gait and mobility (R26.89)    Time: 5520-8022 PT Time Calculation (min) (ACUTE ONLY): 23 min   Charges:   PT Evaluation $PT Eval Moderate Complexity: 1 Mod PT Treatments $Therapeutic Activity: 8-22 mins        Anastasio Champion, DPT  Acute Rehabilitation Services Office [place] 04/29/2022, 11:12 AM

## 2022-04-29 NOTE — Plan of Care (Signed)

## 2022-04-30 ENCOUNTER — Encounter: Payer: Self-pay | Admitting: Family Medicine

## 2022-04-30 DIAGNOSIS — M25561 Pain in right knee: Secondary | ICD-10-CM | POA: Diagnosis not present

## 2022-04-30 DIAGNOSIS — R531 Weakness: Secondary | ICD-10-CM | POA: Diagnosis not present

## 2022-04-30 LAB — GLUCOSE, CAPILLARY
Glucose-Capillary: 114 mg/dL — ABNORMAL HIGH (ref 70–99)
Glucose-Capillary: 143 mg/dL — ABNORMAL HIGH (ref 70–99)
Glucose-Capillary: 133 mg/dL — ABNORMAL HIGH (ref 70–99)
Glucose-Capillary: 116 mg/dL — ABNORMAL HIGH (ref 70–99)

## 2022-04-30 LAB — BASIC METABOLIC PANEL
Anion gap: 9 (ref 5–15)
BUN: 22 mg/dL (ref 8–23)
CO2: 25 mmol/L (ref 22–32)
Calcium: 8.3 mg/dL — ABNORMAL LOW (ref 8.9–10.3)
Chloride: 101 mmol/L (ref 98–111)
Creatinine, Ser: 1.87 mg/dL — ABNORMAL HIGH (ref 0.61–1.24)
GFR, Estimated: 37 mL/min — ABNORMAL LOW (ref >60)
Glucose, Bld: 138 mg/dL — ABNORMAL HIGH (ref 70–99)
Potassium: 3.8 mmol/L (ref 3.5–5.1)
Sodium: 135 mmol/L (ref 135–145)

## 2022-04-30 LAB — CBC
HCT: 33.2 % — ABNORMAL LOW (ref 39.0–52.0)
Hemoglobin: 11.1 g/dL — ABNORMAL LOW (ref 13.0–17.0)
MCH: 29.8 pg (ref 26.0–34.0)
MCHC: 33.4 g/dL (ref 30.0–36.0)
MCV: 89.2 fL (ref 80.0–100.0)
Platelets: 135 10*3/uL — ABNORMAL LOW (ref 150–400)
RBC: 3.72 MIL/uL — ABNORMAL LOW (ref 4.22–5.81)
RDW: 13.3 % (ref 11.5–15.5)
WBC: 8.8 10*3/uL (ref 4.0–10.5)
nRBC: 0 % (ref 0.0–0.2)

## 2022-04-30 LAB — URINE CULTURE: Culture: >=100000 — AB

## 2022-04-30 LAB — PHOSPHORUS: Phosphorus: 3.7 mg/dL (ref 2.5–4.6)

## 2022-04-30 LAB — MAGNESIUM: Magnesium: 2.1 mg/dL (ref 1.7–2.4)

## 2022-04-30 MED ORDER — INSULIN GLARGINE-YFGN 100 UNIT/ML ~~LOC~~ SOLN
20.0000 [IU] | Freq: Every day | SUBCUTANEOUS | Status: DC
  Administered 2022-04-30 – 2022-05-05 (×6): 20 [IU] via SUBCUTANEOUS
  Filled 2022-04-30 (×7): qty 0.2

## 2022-04-30 MED ORDER — ACETAMINOPHEN 500 MG PO TABS: 500.0000 mg | ORAL_TABLET | Freq: Four times a day (QID) | ORAL | Status: DC | PRN

## 2022-04-30 MED ORDER — VITAMIN B-12 1000 MCG PO TABS: 1000.0000 ug | ORAL_TABLET | Freq: Every day | ORAL | Status: DC

## 2022-04-30 MED ORDER — AMOXICILLIN 500 MG PO CAPS
500.0000 mg | ORAL_CAPSULE | Freq: Three times a day (TID) | ORAL | Status: AC
  Administered 2022-05-01 – 2022-05-04 (×12): 500 mg via ORAL
  Filled 2022-04-30 (×12): qty 1

## 2022-04-30 MED ORDER — ACETAMINOPHEN 500 MG PO TABS
1000.0000 mg | ORAL_TABLET | Freq: Three times a day (TID) | ORAL | Status: AC
  Administered 2022-04-30 – 2022-05-03 (×9): 1000 mg via ORAL
  Filled 2022-04-30 (×9): qty 2

## 2022-04-30 MED ORDER — CYANOCOBALAMIN 1000 MCG/ML IJ SOLN
1000.0000 ug | Freq: Every day | INTRAMUSCULAR | Status: DC
  Administered 2022-04-30 – 2022-05-05 (×6): 1000 ug via INTRAMUSCULAR
  Filled 2022-04-30 (×6): qty 1

## 2022-04-30 MED ORDER — ACETAMINOPHEN 500 MG PO TABS: 500.0000 mg | ORAL_TABLET | Freq: Three times a day (TID) | ORAL | Status: DC | PRN

## 2022-04-30 NOTE — Progress Notes (Signed)
[facility] Progress Note  Patient: Adam Merritt    WVP:[id number]  DOA: 04/27/2022     Date of Service: the patient was seen and examined on 04/30/2022  Chief Complaint  Patient presents with   Leg Swelling   Brief hospital course: SHAI MCKENZIE is a 76 y.o. male with medical history significant for diabetes mellitus with complications of stage III chronic kidney disease, coronary artery disease, hypertension, chronic lymphedema, diabetic neuropathy, GERD, history of venous insufficiency with bilateral lower extremity ulcerations who presents to the ER for evaluation of generalized weakness. At baseline patient is wheelchair-bound and is able to transfer but over the last 4 days wife notes increased weakness and difficulty with transfers.  She also states that she has had to assist him with most of his ADLs in the last several days.  She also states that due to his difficulty with ambulation he now has pressure ulcers on his buttocks. During my evaluation at rest patient has tremors at rest which his wife states is due and started in the last 1 month.  Does not have a diagnosis of Parkinson's disease and according to the wife the [place] was supposed to be referring him to a neurologist but that has not happened. Patient denies having any fever or chills, no chest pain, no shortness of breath, no nausea, no vomiting, no headache, no dizziness, no lightheadedness, no blurred vision or any focal deficit Labs show some pyuria.  Renal function is stable with a serum creatinine of 1.92 He will be admitted to the hospital for further evaluation    Assessment and Plan: * General weakness Patient presents to the ER for evaluation of generalized weakness and recurrent falls at home. Per wife he is currently wheelchair-bound and is unable to transfer. He is requiring increased assistance with ADLs PT eval done, recommend SNF placement TOC following    Right knee pain right knee x-ray shows  probable joint effusion Orthopedic surgery consulted for possible arthrocentesis and evaluation.    CKD stage 3 due to type 2 diabetes mellitus ([place]) Patient has diabetes mellitus with complications of stage III chronic kidney disease which is stable Continue long-acting insulin Maintain consistent carbohydrate diet Glycemic control with sliding scale insulin Cr 1.78---1.87   UTI (urinary tract infection) Patient noted to have pyuria which may be contributing to his weakness However urine culture yielded Enterococcus faecalis sensitive to quinolones, penicillin and vancomycin S/p Levaquin, changed to amoxicillin 500 p.o. 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 ([place]) Patient has chronic lower extremity venous stasis with ulceration We will consult wound care   Recurrent falls Secondary to generalized weakness Patient on fall precautions PT evaluate and treat   COPD (chronic obstructive pulmonary disease) ([place]) Stable and not acutely exacerbated Continue as needed bronchodilator therapy and inhaled steroids   Essential hypertension Continue carvedilol   Chronic diastolic CHF (congestive heart failure) (HCC) Stable and not acutely exacerbated Continue furosemide, spironolactone and carvedilol   Anemia secondary to iron deficiency Iron level 26, saturation ratio is 13%, started oral supplement. Folic acid level 7.9, at lower end, started folic acid supplement. Vitamin B12 level 196, target >400, started vitamin B12 1000 mcg IM injection daily during hospital stay followed by oral supplement.  Patient is complaining of peripheral neuropathy could be due to vitamin B12 deficiency   Body mass index is 46.16 kg/m.  Interventions:   PT and OT eval done, recommended SNF placement, TOC is working for placement.    Diet: Carb  modified DVT Prophylaxis: Subcutaneous Lovenox   Advance goals of care discussion: Full code  Family Communication: family was present at bedside, at the time of interview.  The pt provided permission to discuss medical plan with the family. Opportunity was given to ask question and all questions were answered satisfactorily.   Disposition:  Pt is from Home, admitted with generalized weakness, unable to ambulate, PT/OT eval done, recommend SNF placement.   Discharge to SNF, when bed will be available.  Subjective: No significant overnight events, right knee pain is under control, patient was complaining of neuropathy in bilateral lower extremities, no any other active issues, no chest pain or palpitation no shortness of breath.   Physical Exam: General:  alert, NAD.  Appear in mild distress, affect appropriate Eyes: PERRLA ENT: Oral Mucosa Clear, moist  Neck: no JVD,  Cardiovascular: S1 and S2 Present, no Murmur,  Respiratory: good respiratory effort, Bilateral Air entry equal and Decreased, no Crackles, no wheezes Abdomen: Bowel Sound present, Soft and no tenderness,  Skin: no rashes Extremities: 3-4+ Pedal edema, chronic lymphedema, and straps intact, no calf tenderness Neurologic: without any new focal findings Gait not checked due to patient safety concerns  Vitals:   04/29/22 1630 04/29/22 2107 04/30/22 0010 04/30/22 0853  BP: 118/86 109/84 111/82 111/78  Pulse: 81 74 76 79  Resp: '17 17 17 16  '$ Temp: 98.2 F (36.8 C) 98 F (36.7 C) 98 F (36.7 C) 98.4 F (36.9 C)  TempSrc:   Oral   SpO2: 97% 98% 98% 98%  Weight:      Height:        Intake/Output Summary (Last 24 hours) at 04/30/2022 1437 Last data filed at 04/30/2022 1230 Gross per 24 hour  Intake --  Output 700 ml  Net -700 ml   Filed Weights   04/27/22 2221  Weight: 129.7 kg    Data Reviewed: I have personally reviewed and interpreted daily labs, tele strips, imagings as discussed above. I reviewed all  nursing notes, pharmacy notes, vitals, pertinent old records I have discussed plan of care as described above with RN and patient/family.  CBC: Recent Labs  Lab 04/27/22 2233 04/29/22 0607 04/30/22 1009  WBC 10.0 7.8 8.8  NEUTROABS 7.8*  --   --   HGB 11.4* 10.9* 11.1*  HCT 34.7* 33.0* 33.2*  MCV 90.8 88.9 89.2  PLT 164 137* 294*   Basic Metabolic Panel: Recent Labs  Lab 04/27/22 2233 04/29/22 0607 04/30/22 1009  NA 139 139 135  K 3.9 3.8 3.8  CL 104 106 101  CO2 '26 25 25  '$ GLUCOSE 114* 108* 138*  BUN '19 20 22  '$ CREATININE 1.92* 1.78* 1.87*  CALCIUM 8.2* 8.4* 8.3*  MG  --   --  2.1  PHOS  --   --  3.7    Studies: DG Knee 1-2 Views Right  Result Date: 04/29/2022 CLINICAL DATA:  RIGHT knee pain and swelling for 2 days. No known injury. Initial encounter. EXAM: RIGHT KNEE - 1-2 VIEW COMPARISON:  08/29/2017 FINDINGS: No acute fracture, subluxation or dislocation identified. A probable joint effusion is noted. Soft tissue swelling is present. No focal bony lesions are noted. IMPRESSION: Soft tissue swelling and probable joint effusion. No acute bony abnormality. Electronically Signed   By: Margarette Canada M.D.   On: 04/29/2022 17:00    Scheduled Meds:  [  START ON 05/01/2022] amoxicillin  500 mg Oral Q8H   vitamin C  500 mg Oral Daily   aspirin EC  325 mg Oral Daily   atorvastatin  40 mg Oral QHS   carvedilol  25 mg Oral BID WC   vitamin D3  1,000 Units Oral Daily   clotrimazole   Topical BID   cyanocobalamin  1,000 mcg Intramuscular Daily   Followed by   Derrill Memo ON 05/07/2022] vitamin B-12  1,000 mcg Oral Daily   docusate  50 mg Oral BID   enoxaparin (LOVENOX) injection  65 mg Subcutaneous QHS   folic acid  1 mg Oral Daily   furosemide  40 mg Oral Daily   gabapentin  300 mg Oral BID   insulin aspart  0-15 Units Subcutaneous TID WC   insulin glargine-yfgn  20 Units Subcutaneous Daily   iron polysaccharides  150 mg Oral Daily   multivitamin with minerals  1 tablet Oral Daily    pantoprazole  40 mg Oral Daily   pentoxifylline  400 mg Oral TID WC   prazosin  3 mg Oral QHS   senna-docusate  1 tablet Oral BID   sertraline  100 mg Oral Daily   spironolactone  12.5 mg Oral Daily   traZODone  75 mg Oral QHS   Continuous Infusions: PRN Meds: acetaminophen **OR** acetaminophen, ipratropium-albuterol, ondansetron **OR** ondansetron (ZOFRAN) IV  Time spent: 35 minutes  Author: Val Riles. MD [facility] 04/30/2022 2:37 PM  To reach On-call, see care teams to locate the attending and reach out to them via [email or web address]. If 7PM-7AM, please contact night-coverage If you still have difficulty reaching the attending provider, please page the [facility] (Director on Call) for [facility] on amion for assistance.

## 2022-04-30 NOTE — TOC Progression Note (Addendum)
Transition of Care [facility]) - Progression Note    Patient Details  Name: Adam Merritt MRN: [id number] Date of Birth: 06/07/1945  Transition of Care [facility]) CM/SW Contact  Izola Price, RN Phone Number: 04/30/2022, 1:47 PM  Clinical Narrative: 11/16: PASRR Submission requested more documents. These were uploaded with signed FL2. PASRR pending. Submitted bed search with notation that PASRR pending/manual review. Medicare.gov facilities list provided to patient/spouse for review for preference but verbally allowed a broad [place] area search to be initiated. Simmie Davies RN CM       Expected Discharge Plan: [place] Barriers to Discharge: Continued Medical Work up  Expected Discharge Plan and Services Expected Discharge Plan: [place]   Discharge Planning Services: CM Consult   Living arrangements for the past 2 months: Single Family Home                                       Social Determinants of Health (SDOH) Interventions    Readmission Risk Interventions     No data to display

## 2022-04-30 NOTE — TOC Initial Note (Signed)
Transition of Care [facility]) - Initial/Assessment Note    Patient Details  Name: Adam Merritt MRN: [id number] Date of Birth: 28-Jun-1945  Transition of Care [facility]) CM/SW Contact:    Izola Price, RN Phone Number: 04/30/2022, 9:43 AM  Clinical Narrative: 11/16: Admit via ED for generalized weakness. Baseline WC bound with stasis wounds and being actively followed by [facility] Nurse and therapist. Attends outpatient [facility] consult has already seen patient this admission. Compression wraps that are stay on with next drsg. change 05/02/22. No recent admissions to acute care.  Rx: [facility]. [place], [place] PCP: Jonetta Osgood NP at [phone] and Dr. Humphrey Rolls in [place]. Chronic Home oxygen at 2L/[place] at HS.  Spouse believes HH was with [facility].   New SNF recommendations per PT on 11/25. Patient is very [place] and so wife and patient spoke with RN CM re STR/SNF.  Spouse wants patient to go home and resume [facility] services that had just recently been started but she is unable to get him to the bathroom, so they feel STR/SNF is next level of care till he is stronger. Some DME was still pending delivery such as BSC, Newer WC that may help with HH on discharge from STR/SNF. Patient is on HS oxygen at 2L/[place] chronically.  Patient/spouse gave verbal permission to start bed search in the area while they researched a preference as they did not really know about any specifically. Medicare list will be provided and RM CM phone number for today given to spouse.   Will start local bed search pending spouse/patient review of Medicare facility list in [place], [place] area.  Patient has both [employer] in [place] and [employer].  Simmie Davies RN CM   Expected Discharge Plan: [place] Barriers to Discharge: Continued Medical Work up   Patient Goals and CMS Choice        Expected Discharge Plan and Services Expected  Discharge Plan: [place]   Discharge Planning Services: CM Consult   Living arrangements for the past 2 months: Single Family Home                                      Prior Living Arrangements/Services Living arrangements for the past 2 months: Single Family Home Lives with:: Spouse                   Activities of Daily Living Home Assistive Devices/Equipment: Environmental consultant (specify type), Cane (specify quad or straight), Eyeglasses, Hearing aid ADL Screening (condition at time of admission) Patient's cognitive ability adequate to safely complete daily activities?: Yes Is the patient deaf or have difficulty hearing?: Yes Does the patient have difficulty seeing, even when wearing glasses/contacts?: Yes Does the patient have difficulty concentrating, remembering, or making decisions?: No Patient able to express need for assistance with ADLs?: Yes Does the patient have difficulty dressing or bathing?: Yes Independently performs ADLs?: No Communication: Independent Dressing (OT): Needs assistance Grooming: Needs assistance Does the patient have difficulty walking or climbing stairs?: Yes Weakness of Legs: Both Weakness of Arms/Hands: Both  Permission Sought/Granted                  Emotional Assessment              Admission diagnosis:  Weakness [R53.1] General weakness [R53.1] Acute UTI [N39.0]  Patient Active Problem List   Diagnosis Date Noted   General weakness 04/28/2022   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 ([place]) 04/28/2022   Depression 04/28/2022   UTI (urinary tract infection) 04/28/2022   Self-care deficit for grooming and hygiene 03/28/2022   Recurrent falls 03/28/2022   Generalized weakness 03/28/2022   Unable to stand up 03/28/2022   Obstructive chronic bronchitis with exacerbation ([place]) 02/09/2021   PTSD (post-traumatic stress disorder) 12/08/2019   Type 2 diabetes mellitus with hyperglycemia ([place]) 03/19/2019    Aplastic anemia, unspecified ([place]) 03/19/2019   Acute pain of right knee 02/23/2019   Uncontrolled type 2 diabetes mellitus with hyperglycemia ([place]) 09/17/2018   Primary insomnia 08/07/2018   Bacteremia due to group B Streptococcus 07/02/2018   COPD (chronic obstructive pulmonary disease) ([place]) 05/13/2018   Dysuria 05/05/2018   Encounter for general adult medical examination with abnormal findings 05/05/2018   CHF exacerbation ([place]) 04/16/2018   Achilles tendon disorder, right 04/02/2018   Essential hypertension 01/28/2018   Lymphedema of both lower extremities 01/28/2018   Cellulitis and abscess of lower extremity 09/20/2017   Edema of both lower legs due to peripheral venous insufficiency 09/20/2017   Cellulitis 09/20/2017   Atopic dermatitis 09/02/2017   Chronic pain disorder 09/02/2017   CVA (cerebral vascular accident) ([place]) 09/15/2016   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   CVA (cerebral infarction) 02/12/2015   Chronic diastolic CHF (congestive heart failure) ([place]) 11/30/2014   Benign essential hypertension 11/06/2014   CAD (coronary artery disease) of artery bypass graft 01/19/2014   CKD stage 3 due to type 2 diabetes mellitus ([place]) 01/19/2014   Acute bronchitis with COPD ([place]) 01/19/2014   Diabetic neuropathy ([place]) 01/19/2014   Insulin dependent diabetes mellitus 01/19/2014   OSA (obstructive sleep apnea) 01/19/2014   Mixed hyperlipidemia 10/29/2013   Peripheral vascular disease, unspecified ([place]) 09/29/2013   PCP:  Jonetta Osgood, NP Pharmacy:   [id number] [address], [place] AT [address] [place] [address] Phone: [phone] Fax: [place], [place] - [address] [place] [address] Phone: [phone] Fax: [phone]     Social Determinants of Health (SDOH)  Interventions    Readmission Risk Interventions     No data to display

## 2022-04-30 NOTE — NC FL2 (Signed)
[place] LEVEL OF CARE SCREENING TOOL     IDENTIFICATION  Patient Name: Adam Merritt Birthdate: [birth year] Sex: male Admission Date (Current Location): 04/27/2022  [facility] and [place] Number:  Engineering geologist and Address:  [facility], [address], [place], [place] [address]      Provider Number: [id number]  Attending Physician Name and Address:  Val Riles, MD  Relative Name and Phone Number:  Kendyn, Zaman (Spouse) [phone] (Mobile)    Current Level of Care: Hospital Recommended Level of Care: [place] Prior Approval Number:    Date Approved/Denied:  (Pending today date, requesting more info including signed FL2.) PASRR Number:  (Pending more information including FL2.)  Discharge Plan: SNF    Current Diagnoses: Patient Active Problem List   Diagnosis Date Noted   General weakness 04/28/2022   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 ([place]) 04/28/2022   Depression 04/28/2022   UTI (urinary tract infection) 04/28/2022   Self-care deficit for grooming and hygiene 03/28/2022   Recurrent falls 03/28/2022   Generalized weakness 03/28/2022   Unable to stand up 03/28/2022   Obstructive chronic bronchitis with exacerbation ([place]) 02/09/2021   PTSD (post-traumatic stress disorder) 12/08/2019   Type 2 diabetes mellitus with hyperglycemia ([place]) 03/19/2019   Aplastic anemia, unspecified ([place]) 03/19/2019   Acute pain of right knee 02/23/2019   Uncontrolled type 2 diabetes mellitus with hyperglycemia ([place]) 09/17/2018   Primary insomnia 08/07/2018   Bacteremia due to group B Streptococcus 07/02/2018   COPD (chronic obstructive pulmonary disease) ([place]) 05/13/2018   Dysuria 05/05/2018   Encounter for general adult medical examination with abnormal findings 05/05/2018   CHF exacerbation ([place]) 04/16/2018   Achilles tendon disorder, right 04/02/2018   Essential hypertension 01/28/2018   Lymphedema of both  lower extremities 01/28/2018   Cellulitis and abscess of lower extremity 09/20/2017   Edema of both lower legs due to peripheral venous insufficiency 09/20/2017   Cellulitis 09/20/2017   Atopic dermatitis 09/02/2017   Chronic pain disorder 09/02/2017   CVA (cerebral vascular accident) ([place]) 09/15/2016   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   CVA (cerebral infarction) 02/12/2015   Chronic diastolic CHF (congestive heart failure) ([place]) 11/30/2014   Benign essential hypertension 11/06/2014   CAD (coronary artery disease) of artery bypass graft 01/19/2014   CKD stage 3 due to type 2 diabetes mellitus ([place]) 01/19/2014   Acute bronchitis with COPD ([place]) 01/19/2014   Diabetic neuropathy ([place]) 01/19/2014   Insulin dependent diabetes mellitus 01/19/2014   OSA (obstructive sleep apnea) 01/19/2014   Mixed hyperlipidemia 10/29/2013   Peripheral vascular disease, unspecified ([place]) 09/29/2013    Orientation RESPIRATION BLADDER Height & Weight     Self, Time, Situation, Place  O2 (2L/[place] at HS when at home.) Continent, External catheter Weight: 129.7 kg Height:  '5\' 6"'$  (167.6 cm)  BEHAVIORAL SYMPTOMS/MOOD NEUROLOGICAL BOWEL NUTRITION STATUS      Continent Diet  AMBULATORY STATUS COMMUNICATION OF NEEDS Skin   Extensive Assist Verbally (HARD OF HEARING) Other (Comment) (Venous stasis wounds on LE with compression wraps/ Wound Care consult and outpatient wound care. Next dressing change 05/02/22.)                       Personal Care Assistance Level of Assistance  Bathing, Feeding, Dressing Bathing Assistance: Maximum assistance Feeding assistance: Limited assistance Dressing Assistance: Maximum assistance     Functional Limitations Info  Sight, Hearing Sight Info: Impaired Hearing Info: Impaired  SPECIAL CARE FACTORS FREQUENCY  PT (By licensed PT), OT (By licensed OT)     PT Frequency: 5x/week OT Frequency: 5x/week            Contractures Contractures Info: Not  present    Additional Factors Info  Code Status, Allergies Code Status Info: Full Code Allergies Info: 04/29/22: No Known Allergies; please verify in chart for any updates.           Current Medications (04/30/2022):  This is the current hospital active medication list Current Facility-Administered Medications  Medication Dose Route Frequency Provider Last Rate Last Admin   acetaminophen (TYLENOL) tablet 650 mg  650 mg Oral Q6H PRN Agbata, Tochukwu, MD   650 mg at 04/28/22 2131   Or   acetaminophen (TYLENOL) suppository 650 mg  650 mg Rectal Q6H PRN Agbata, Tochukwu, MD       [START ON 05/01/2022] amoxicillin (AMOXIL) capsule 500 mg  500 mg Oral Q8H Kumar, Dileep, MD       ascorbic acid (VITAMIN C) tablet 500 mg  500 mg Oral Daily Val Riles, MD   500 mg at 04/30/22 1004   aspirin EC tablet 325 mg  325 mg Oral Daily Agbata, Tochukwu, MD   325 mg at 04/30/22 1003   atorvastatin (LIPITOR) tablet 40 mg  40 mg Oral QHS Agbata, Tochukwu, MD   40 mg at 04/29/22 2100   carvedilol (COREG) tablet 25 mg  25 mg Oral BID WC Agbata, Tochukwu, MD   25 mg at 04/30/22 5027   cholecalciferol (VITAMIN D3) 25 MCG (1000 UNIT) tablet 1,000 Units  1,000 Units Oral Daily Agbata, Tochukwu, MD   1,000 Units at 04/30/22 1003   clotrimazole (LOTRIMIN) 1 % cream   Topical BID Arta Silence, MD   Given at 04/30/22 1012   cyanocobalamin (VITAMIN B12) injection 1,000 mcg  1,000 mcg Intramuscular Daily Val Riles, MD       Followed by   Derrill Memo ON 05/07/2022] cyanocobalamin (VITAMIN B12) tablet 1,000 mcg  1,000 mcg Oral Daily Val Riles, MD       docusate (COLACE) 50 MG/5ML liquid 50 mg  50 mg Oral BID Agbata, Tochukwu, MD   50 mg at 04/30/22 1014   enoxaparin (LOVENOX) injection 65 mg  65 mg Subcutaneous QHS Agbata, Tochukwu, MD   65 mg at 74/12/87 8676   folic acid (FOLVITE) tablet 1 mg  1 mg Oral Daily Val Riles, MD   1 mg at 04/30/22 1004   furosemide (LASIX) tablet 40 mg  40 mg Oral Daily Agbata,  Tochukwu, MD   40 mg at 04/30/22 1003   gabapentin (NEURONTIN) capsule 300 mg  300 mg Oral BID Alison Murray, RPH   300 mg at 04/30/22 1005   insulin aspart (novoLOG) injection 0-15 Units  0-15 Units Subcutaneous TID WC Agbata, Tochukwu, MD   2 Units at 04/30/22 1223   insulin glargine-yfgn (SEMGLEE) injection 20 Units  20 Units Subcutaneous Daily Val Riles, MD   20 Units at 04/30/22 1223   ipratropium-albuterol (DUONEB) 0.5-2.5 (3) MG/3ML nebulizer solution 3 mL  3 mL Nebulization Q4H PRN Agbata, Tochukwu, MD       iron polysaccharides (NIFEREX) capsule 150 mg  150 mg Oral Daily Val Riles, MD   150 mg at 04/30/22 1005   multivitamin with minerals tablet 1 tablet  1 tablet Oral Daily Agbata, Tochukwu, MD   1 tablet at 04/30/22 1003   ondansetron (ZOFRAN) tablet 4 mg  4 mg Oral Q6H  PRN Collier Bullock, MD       Or   ondansetron (ZOFRAN) injection 4 mg  4 mg Intravenous Q6H PRN Agbata, Tochukwu, MD   4 mg at 04/29/22 2100   pantoprazole (PROTONIX) EC tablet 40 mg  40 mg Oral Daily Agbata, Tochukwu, MD   40 mg at 04/30/22 1005   pentoxifylline (TRENTAL) CR tablet 400 mg  400 mg Oral TID WC Agbata, Tochukwu, MD   400 mg at 04/30/22 1223   prazosin (MINIPRESS) capsule 3 mg  3 mg Oral QHS Agbata, Tochukwu, MD   3 mg at 04/29/22 2108   senna-docusate (Senokot-S) tablet 1 tablet  1 tablet Oral BID Agbata, Tochukwu, MD   1 tablet at 04/30/22 1003   sertraline (ZOLOFT) tablet 100 mg  100 mg Oral Daily Agbata, Tochukwu, MD   100 mg at 04/30/22 1005   spironolactone (ALDACTONE) tablet 12.5 mg  12.5 mg Oral Daily Agbata, Tochukwu, MD   12.5 mg at 04/29/22 1010   traZODone (DESYREL) tablet 75 mg  75 mg Oral QHS Agbata, Tochukwu, MD   75 mg at 04/29/22 2100     Discharge Medications: Please see discharge summary for a list of discharge medications.  Relevant Imaging Results:  Relevant Lab Results:   Additional Information Patient SS# [id number].     NOTE: Baseline WC bound and able to  transfer but not this admission, too weak. Provider notes indicated existing pressure ulcers on buttocks but not yet documented in flowsheet or by [place].  Izola Price, RN

## 2022-04-30 NOTE — Consult Note (Signed)
ORTHOPAEDIC CONSULTATION  REQUESTING PHYSICIAN: Val Riles, MD  Chief Complaint:   R knee pain  History of Present Illness: Adam Merritt is a 76 y.o. male with a medical history significant for DM, stage III CKD, CAD, HTN, chronic lymphedema, and venous insufficiency with bilateral lower extremity ulcerations who initially presented to the ED on 04/27/2022 for increased weakness.  The patient does not ambulate at baseline, but has been able to stand and pivot to transfer, but he was having difficulty doing this as well now.  He was found to have a UTI and was started on antibiotics And then admitted.  He states that he has had knee pain for the the past few months, but has been tolerable.  He feels that it might be slowly worsening and also feels that it may be limiting his ability to transfer, in addition to his generalized weakness.  He also states he has had multiple falls over the past few months due to sensations of weakness as opposed to the pain itself.  Past Medical History:  Diagnosis Date   Anemia    CAD (coronary artery disease)    Chest pain    Coronary artery disease    Diabetes mellitus without complication (HCC)    Diastolic CHF ([place])    Esophageal reflux    Herpes zoster without mention of complication    Hyperlipidemia    Hypertension    Insomnia    Lumbago    Lymphedema    Neuropathy in diabetes ([place])    Obstructive chronic bronchitis without exacerbation    Other malaise and fatigue    Stroke ([place])    Varicose veins    Past Surgical History:  Procedure Laterality Date   AMPUTATION TOE Right 06/23/2018   Procedure: AMPUTATION TOE;  Surgeon: Sharlotte Alamo, DPM;  Location: [facility] ORS;  Service: Podiatry;  Laterality: Right;   CHOLECYSTECTOMY     CORONARY ARTERY BYPASS GRAFT     Social History   Socioeconomic History   Marital status: Married    Spouse name: Not on file   Number of children: Not  on file   Years of education: Not on file   Highest education level: Not on file  Occupational History   Occupation: retired  Tobacco Use   Smoking status: Never   Smokeless tobacco: Never  Vaping Use   Vaping Use: Never used  Substance and Sexual Activity   Alcohol use: No    Comment: quit alcohol 30 years ago   Drug use: No   Sexual activity: Not Currently  Other Topics Concern   Not on file  Social History Narrative   Lives at home with wife, uses a wheelchair now   Social Determinants of Health   Financial Resource Strain: [place]  (04/10/2019)   Overall Financial Resource Strain (CARDIA)    Difficulty of Paying Living Expenses: Not hard at all  Food Insecurity: No [place] (04/28/2022)   Hunger Vital Sign    Worried About Running Out of Food in the Last Year: Never true    [place] in the Last Year: Never true  Transportation Needs: No Transportation Needs (04/28/2022)   PRAPARE - Hydrologist (Medical): No    Lack of Transportation (Non-Medical): No  Physical Activity: Inactive (04/10/2019)   Exercise Vital Sign    Days of Exercise per Week: 0 days    Minutes of Exercise per Session: 0 min  Stress: No Stress Concern  Present (04/10/2019)   [place]    Feeling of Stress : Only a little  Social Connections: Moderately Integrated (04/10/2019)   Social Connection and Isolation Panel [NHANES]    Frequency of Communication with Friends and Family: More than three times a week    Frequency of Social Gatherings with Friends and Family: More than three times a week    Attends Religious Services: More than 4 times per year    Active Member of [employer] or Organizations: No    Attends Music therapist: Never    Marital Status: Married   Family History  Problem Relation Age of Onset   Diabetes Mother    Hyperlipidemia Mother    Hypertension Mother     Diabetes Father    Hyperlipidemia Father    Hypertension Father    CAD Father    No Known Allergies Prior to Admission medications   Medication Sig Start Date End Date Taking? Authorizing Provider  albuterol (VENTOLIN HFA) 108 (90 Base) MCG/ACT inhaler Inhale 2 puffs into the lungs every 6 (six) hours as needed for wheezing or shortness of breath. 02/09/21  Yes Abernathy, Yetta Flock, NP  aspirin 325 MG EC tablet Take 325 mg by mouth daily.   Yes [provider]  atorvastatin (LIPITOR) 40 MG tablet Take 40 mg by mouth at bedtime.    Yes [provider]  carvedilol (COREG) 25 MG tablet Take 25 mg by mouth 2 (two) times daily with a meal.   Yes [provider]  docusate sodium (COLACE) 50 MG capsule Take 50 mg by mouth 2 (two) times daily.   Yes [provider]  furosemide (LASIX) 40 MG tablet TAKE 1 TABLET(40 MG) BY MOUTH DAILY 04/13/22  Yes Abernathy, Alyssa, NP  gabapentin (NEURONTIN) 300 MG capsule TAKE 2 CAPSULES BY MOUTH EVERY MORNING, 1 CAPSULE EVERY EVENING AND 2 CAPSULES AT NIGHT 09/12/21  Yes Abernathy, Alyssa, NP  Insulin Glargine (BASAGLAR KWIKPEN) 100 UNIT/ML Inject 30 Units into the skin daily. 04/10/22  Yes Abernathy, Alyssa, NP  ipratropium-albuterol (DUONEB) 0.5-2.5 (3) MG/3ML SOLN Take 3 mLs by nebulization every 4 (four) hours as needed (for shortness of breath). 06/15/21  Yes Merlyn Lot, MD  liraglutide (VICTOZA) 18 MG/3ML SOPN INJECT 1.'8MG'$  INTO THE SKIN DAILY 06/22/21  Yes Abernathy, Alyssa, NP  mometasone (ELOCON) 0.1 % lotion Apply three times weekly as directed. 05/23/21  Yes Ralene Bathe, MD  Multiple Vitamin (MULTIVITAMIN WITH MINERALS) TABS tablet Take 1 tablet by mouth daily.   Yes [provider]  omeprazole (PRILOSEC) 20 MG capsule Take 20 mg by mouth daily. 02/08/22  Yes [provider]  pentoxifylline (TRENTAL) 400 MG CR tablet TAKE 1 TABLET BY MOUTH THREE TIMES DAILY FOR CLAUDICATION 10/05/21  Yes Abernathy, Alyssa,  NP  potassium chloride SA (KLOR-CON M) 20 MEQ tablet TAKE 1 TABLET BY MOUTH TWICE DAILY 02/04/22  Yes [name], MD  prazosin (MINIPRESS) 1 MG capsule TAKE THREE CAPSULES BY MOUTH EVERY EVENING FOR NIGHTMARES 07/01/20  Yes [provider]  sertraline (ZOLOFT) 100 MG tablet Take 100 mg by mouth daily.  10/17/18  Yes [provider]  spironolactone (ALDACTONE) 25 MG tablet Take 12.5 mg by mouth daily.   Yes [provider]  traZODone (DESYREL) 150 MG tablet Take 75 mg by mouth at bedtime.   Yes [provider]  triamcinolone cream (KENALOG) 0.1 % Apply 1 application topically 2 (two) times daily. 12/02/20  Yes Abernathy, Alyssa, NP  vitamin D3 (CHOLECALCIFEROL) 25 MCG tablet Take 1,000 Units by mouth daily.   Yes [provider]  azithromycin (ZITHROMAX) 250 MG tablet Take 1 tablet (250 mg total) by mouth daily. Use as directed for 5 days Patient not taking: Reported on 04/28/2022 12/23/21   Mylinda Latina, PA-C  Continuous Blood Gluc Sensor (FREESTYLE LIBRE 2 SENSOR) MISC 1 Device by Does not apply route every 14 (fourteen) days. 03/21/21   Jonetta Osgood, NP  febuxostat (ULORIC) 40 MG tablet Take 1 tablet (40 mg total) by mouth daily. Patient not taking: Reported on 04/28/2022 03/17/19   Ronnell Freshwater, NP  FEROSUL 325 (65 Fe) MG tablet Take 325 mg by mouth daily with breakfast. Patient not taking: Reported on 04/28/2022 03/10/22   [provider]  fluticasone (FLOVENT HFA) 110 MCG/ACT inhaler Take 2 puffs in am and pm  for copd // chronic bronchitis ( rinse mouth) Patient not taking: Reported on 04/28/2022 03/18/20   [name], MD  glucose blood test strip Use as instructed to check blood sugars twice daily 02/23/20   [name], MD  Insulin Pen Needle (BD PEN NEEDLE NANO U/F) 32G X 4 MM MISC Use as directed with insulin DX e11.65 01/10/21   [name], MD  ondansetron (ZOFRAN) 4 MG tablet Take 1 tablet (4 mg total) by mouth  every 8 (eight) hours as needed. Patient not taking: Reported on 04/28/2022 02/15/19   Nance Pear, MD  OneTouch Delica Lancets 02D MISC Use twice daily diag e11.65 02/23/20   [name], MD  predniSONE (DELTASONE) 10 MG tablet Take one tab 3 x day for 3 days, then take one tab 2 x a day for 3 days and then take one tab a day for 3 days for copd Patient not taking: Reported on 04/28/2022 06/21/21   Jonetta Osgood, NP  sennosides-docusate sodium (SENOKOT-S) 8.6-50 MG tablet Take 1-2 tablets by mouth 2 (two) times daily.    [provider]  tiZANidine (ZANAFLEX) 2 MG tablet Take 1 tablet (2 mg total) by mouth 3 (three) times daily. Patient not taking: Reported on 04/28/2022 06/02/20   Luiz Ochoa, NP   Recent Labs    04/27/22 2233 04/29/22 0607 04/30/22 1009  WBC 10.0 7.8 8.8  HGB 11.4* 10.9* 11.1*  HCT 34.7* 33.0* 33.2*  PLT 164 137* 135*  K 3.9 3.8 3.8  CL 104 106 101  CO2 '26 25 25  '$ BUN '19 20 22  '$ CREATININE 1.92* 1.78* 1.87*  GLUCOSE 114* 108* 138*  CALCIUM 8.2* 8.4* 8.3*   DG Knee 1-2 Views Right  Result Date: 04/29/2022 CLINICAL DATA:  RIGHT knee pain and swelling for 2 days. No known injury. Initial encounter. EXAM: RIGHT KNEE - 1-2 VIEW COMPARISON:  08/29/2017 FINDINGS: No acute fracture, subluxation or dislocation identified. A probable joint effusion is noted. Soft tissue swelling is present. No focal bony lesions are noted. IMPRESSION: Soft tissue swelling and probable joint effusion. No acute bony abnormality. Electronically Signed   By: Margarette Canada M.D.   On: 04/29/2022 17:00     Positive ROS: All other systems have been reviewed and were otherwise negative with the exception of those mentioned in the HPI and as above.  Physical Exam: BP 111/78 (BP Location: Right Arm)   Pulse 79   Temp 98.4 F (36.9 C)   Resp 16   Ht '5\' 6"'$  (1.676 m)   Wt 129.7 kg   SpO2  98%   BMI 46.16 kg/m  General:  Alert, no acute distress Psychiatric:  Patient is  competent for consent with normal mood and affect   Cardiovascular:  No pedal edema, regular rate and rhythm Respiratory:  No wheezing, non-labored breathing GI:  Abdomen is soft and non-tender Skin:  No lesions in the area of chief complaint, no erythema Neurologic:  Sensation intact distally, CN grossly intact Lymphatic:  No axillary or cervical lymphadenopathy  Orthopedic Exam:  RLE: 5/5 DF/PF/EHL SILT s/s/t/sp/dp distr Foot wwp RoM knee: 0-90 without significant difficulty actively and passively.  Most prominent tenderness about medial joint line, moderate tenderness about lateral joint line.  There is a 1+ effusion.  No significant erythema or notable warmth.  X-rays:  As above: Nonweightbearing radiographs of the right knee show early degenerative changes with osteophyte formation about the patella. There also appear to be medial compartment degenerative changes with joint space narrowing.  No fractures or dislocations noted.  Assessment/Plan: 76 year old male with likely symptomatic early right knee osteoarthritis with possible concurrent medial meniscus tear.  1.  We discussed the clinical and imaging findings.  We agreed to proceed with further nonoperative management.  This would consist of medications such as maxing out on Tylenol dosing of 1000 mg 3 times daily.  NSAIDs could be an option, but given the patient's chronic kidney disease, I would defer to primary medical team regarding NSAID use.  We discussed that a joint aspiration would be unlikely to be of any significant benefit given low concern for any inflammatory process or infectious process given current clinical exam.  Furthermore a knee joint injection of corticosteroid could be performed, but I would recommend deferring this until patient was off of antibiotics for recurrent UTI. 2.  Continue physical therapy for mobilization and generalized strengthening. 3.  Neurology follow-up as an outpatient may be beneficial given  patient's underlying tremor, generalized weakness, and multiple falls. 4.  Patient may follow-[facility] with [facility] after expected antibiotic course is completed in the next 1-2 weeks for consideration of knee joint injection of corticosteroid, if knee is still significantly symptomatic. 5.  Will plan to follow peripherally.  Please page with any further questions.    Leim Fabry   04/30/2022 3:38 PM

## 2022-05-01 ENCOUNTER — Telehealth: Payer: Self-pay | Admitting: Nurse Practitioner

## 2022-05-01 DIAGNOSIS — R531 Weakness: Secondary | ICD-10-CM | POA: Diagnosis not present

## 2022-05-01 DIAGNOSIS — D649 Anemia, unspecified: Secondary | ICD-10-CM | POA: Diagnosis present

## 2022-05-01 DIAGNOSIS — E66813 Obesity, class 3: Secondary | ICD-10-CM | POA: Insufficient documentation

## 2022-05-01 LAB — BASIC METABOLIC PANEL
Anion gap: 8 (ref 5–15)
BUN: 25 mg/dL — ABNORMAL HIGH (ref 8–23)
CO2: 26 mmol/L (ref 22–32)
Calcium: 8.5 mg/dL — ABNORMAL LOW (ref 8.9–10.3)
Chloride: 104 mmol/L (ref 98–111)
Creatinine, Ser: 1.8 mg/dL — ABNORMAL HIGH (ref 0.61–1.24)
GFR, Estimated: 38 mL/min — ABNORMAL LOW (ref >60)
Glucose, Bld: 115 mg/dL — ABNORMAL HIGH (ref 70–99)
Potassium: 3.6 mmol/L (ref 3.5–5.1)
Sodium: 138 mmol/L (ref 135–145)

## 2022-05-01 LAB — GLUCOSE, CAPILLARY
Glucose-Capillary: 113 mg/dL — ABNORMAL HIGH (ref 70–99)
Glucose-Capillary: 153 mg/dL — ABNORMAL HIGH (ref 70–99)
Glucose-Capillary: 96 mg/dL (ref 70–99)
Glucose-Capillary: 114 mg/dL — ABNORMAL HIGH (ref 70–99)

## 2022-05-01 LAB — PHOSPHORUS: Phosphorus: 3.6 mg/dL (ref 2.5–4.6)

## 2022-05-01 LAB — CBC
HCT: 33.2 % — ABNORMAL LOW (ref 39.0–52.0)
Hemoglobin: 10.9 g/dL — ABNORMAL LOW (ref 13.0–17.0)
MCH: 29.4 pg (ref 26.0–34.0)
MCHC: 32.8 g/dL (ref 30.0–36.0)
MCV: 89.5 fL (ref 80.0–100.0)
Platelets: 138 10*3/uL — ABNORMAL LOW (ref 150–400)
RBC: 3.71 MIL/uL — ABNORMAL LOW (ref 4.22–5.81)
RDW: 13.3 % (ref 11.5–15.5)
WBC: 7.5 10*3/uL (ref 4.0–10.5)
nRBC: 0 % (ref 0.0–0.2)

## 2022-05-01 LAB — MAGNESIUM: Magnesium: 2.3 mg/dL (ref 1.7–2.4)

## 2022-05-01 NOTE — Telephone Encounter (Signed)
04/05/22 Home orders signed. Faxed back to [facility]; [phone]

## 2022-05-01 NOTE — Assessment & Plan Note (Signed)
Continue gabapentin.

## 2022-05-01 NOTE — Progress Notes (Signed)
Progress Note   Patient: Adam Merritt [id number]:[id number] DOB: [birth year] DOA: 04/27/2022     0 DOS: the patient was seen and examined on 05/01/2022   Brief hospital course: Taken from prior notes.  Adam Merritt is a 76 y.o. male with medical history significant for diabetes mellitus with complications of stage III chronic kidney disease, coronary artery disease, hypertension, chronic lymphedema, diabetic neuropathy, GERD, history of venous insufficiency with bilateral lower extremity ulcerations who presents to the ER for evaluation of generalized weakness. At baseline patient is wheelchair-bound and is able to transfer but over the last 4 days wife notes increased weakness and difficulty with transfers. Patient also has tremors at rest, which started approximately a month ago.  Does not have a diagnosis of Parkinson's disease and according to the wife the [place] was supposed to be referring him to a neurologist but that has not happened.  Also found to have UTI with Enterococcus faecalis  sensitive to quinolones, penicillin and vancomycin S/p Levaquin, changed to amoxicillin 500 p.o. every 8 hourly. PT/OT recommending SNF-TOC is working on it  11/27: Hemodynamically stable.  Labs stable-consistent with CKD stage IIIb. Orthopedic surgery was consulted for concern of worsening knee pain.  Imaging consistent with early osteoarthritis, orthopedic is recommending conservative management with controlling pain, will not be candidate for NSAID due to CKD. They will consider steroid injection as outpatient once completed the course of antibiotic. Still no bed offers yet. Has bilateral Unna boot which will be replaced today.  Assessment and Plan: * General weakness Patient presents to the ER for evaluation of generalized weakness and recurrent falls at home. Per wife he is currently wheelchair-bound and is unable to transfer. He is requiring increased assistance with ADLs PT is recommending SNF-no bed  offer yet  Recurrent falls Secondary to generalized weakness Patient on fall precautions PT evaluated him and recommending SNF   UTI (urinary tract infection) Patient noted to have pyuria which may be contributing to his weakness However urine culture yielded Enterococcus faecalis sensitive to quinolones, penicillin and vancomycin Patient initially received Levaquin which was changed to amoxicillin 500 mg every 8 hourly to complete a 5-day course  CKD stage 3 due to type 2 diabetes mellitus ([place]) Patient has diabetes mellitus with complications of stage IIIB chronic kidney disease which is stable- -monitor renal function -Avoid left toxins  Chronic diastolic CHF (congestive heart failure) (HCC) Stable and not acutely exacerbated Continue furosemide, spironolactone and carvedilol  Essential hypertension Blood pressure within goal. -Continue carvedilol, furosemide and spironolactone  COPD (chronic obstructive pulmonary disease) (HCC) Stable and not acutely exacerbated Continue as needed bronchodilator therapy and inhaled steroids  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 ([place]) Patient has chronic lower extremity venous stasis with ulceration Wound care was also consulted. -Change Unna boot  Depression Continue Zoloft and trazodone  Type 2 diabetes mellitus with hyperglycemia (HCC) CBG currently within goal. -Continue Semglee 20 units daily -Continue moderate scale SSI  Diabetic neuropathy (HCC) -Continue gabapentin  Anemia Anemia panel with low iron and B12 along with borderline folic acid. -Patient was started on iron, folic acid and B16 supplement. Currently taking IM B12 and can be discharged on p.o.  Mixed hyperlipidemia -Continue Lipitor  Obesity, Class III, BMI 40-49.9 (morbid obesity) ([place]) Estimated body mass index is 46.16 kg/m as calculated from the following:   Height as of this encounter: '5\' 6"'$  (1.676 m).   Weight as of this encounter: 129.7  kg.   -This will complicate overall  prognosis   Subjective: Patient was seen and examined today.  Continues to feel weak.  Having bilateral hand resting tremors.  Wife at bedside and she is unable to take care of him at home at this point.  She was also requesting to change Unna boot as they are due for change.  Physical Exam: Vitals:   04/30/22 0853 04/30/22 1611 04/30/22 2357 05/01/22 0848  BP: 111/78 111/67 123/60 (!) 127/92  Pulse: 79 75 66 62  Resp: '16 16 18 16  '$ Temp: 98.4 F (36.9 C) 98 F (36.7 C) 98 F (36.7 C) 97.7 F (36.5 C)  TempSrc:      SpO2: 98% 99% 94% 99%  Weight:      Height:       General.  Morbidly obese gentleman, in no acute distress. Pulmonary.  Lungs clear bilaterally, normal respiratory effort. CV.  Regular rate and rhythm, no JVD, rub or murmur. Abdomen.  Soft, nontender, nondistended, BS positive. CNS.  Alert and oriented .  No focal neurologic deficit. Extremities.  1+ LE edema edema bilaterally, Unna boot in place. Psychiatry.  Judgment and insight appears normal.   Data Reviewed: Prior data reviewed.  Family Communication: Discussed with wife at bedside  Disposition: Status is: Observation The patient remains OBS appropriate and will d/c before 2 midnights.  Planned Discharge Destination: Skilled nursing facility  Time spent: 40 minutes  Author: Lorella Nimrod, MD 05/01/2022 3:21 PM  For on call review [email or web address].

## 2022-05-01 NOTE — Assessment & Plan Note (Signed)
-  Continue Lipitor °

## 2022-05-01 NOTE — Assessment & Plan Note (Signed)
Estimated body mass index is 46.16 kg/m as calculated from the following:   Height as of this encounter: '5\' 6"'$  (1.676 m).   Weight as of this encounter: 129.7 kg.   -This will complicate overall prognosis

## 2022-05-01 NOTE — Assessment & Plan Note (Signed)
Anemia panel with low iron and B12 along with borderline folic acid. -Patient was started on iron, folic acid and O72 supplement. Currently taking IM B12 and can be discharged on p.o.

## 2022-05-01 NOTE — Consult Note (Signed)
WOC Nurse Consult Note: Consult requested for [employer] change.  According to the outpatient wound care center notes, patient is actually wearing 4 layer compression wraps, and these are due to be changed tomorrow.  WOC will apply 4 layer Profore to BLE tomorrow morning.  Discussed plan of care with bedside nurse via phone call. Supplies ordered to the room for use. Thank-you,  Julien Girt MSN, [place], [place], [place], [place]

## 2022-05-01 NOTE — Progress Notes (Signed)
Nutrition Brief Note  Patient identified on the Malnutrition Screening Tool (MST) Report  Wt Readings from Last 15 Encounters:  04/27/22 129.7 kg  04/25/22 129.7 kg  03/28/22 136.1 kg  06/15/21 136.1 kg  05/24/21 136.1 kg  03/21/21 136.1 kg  02/28/21 136.1 kg  02/24/21 136 kg  02/09/21 136.1 kg  12/02/20 136.1 kg  11/02/20 136.1 kg  05/18/20 130.6 kg  04/19/20 130.6 kg  03/18/20 (!) 136.5 kg  12/18/19 135.6 kg   Pt with medical history significant for diabetes mellitus with complications of stage III chronic kidney disease, coronary artery disease, hypertension, chronic lymphedema, diabetic neuropathy, GERD, history of venous insufficiency with bilateral lower extremity ulcerations who presents for evaluation of generalized weakness.   Pt admitted with generalized weakness and recurrent falls.   Reviewed I/O's: -1.7 L x 24 hours and -2.4 L since admission  UOP: 1.7 L x 24 hours  Pt unavailable at time of visit. Attempted to speak with pt via call to hospital room phone, however, unable to reach. RD unable to obtain further nutrition-related history or complete nutrition-focused physical exam at this time.    Per MD notes, plan to discharge to SNF once bed is available.   Body mass index is 46.16 kg/m. Patient meets criteria for obesity, class III based on current BMI. Obesity is a complex, chronic medical condition that is optimally managed by a multidisciplinary care team. Weight loss is not an ideal goal for an acute inpatient hospitalization. However, if further work-up for obesity is warranted, consider outpatient referral to outpatient bariatric service and/or [place] Nutrition [facility] Diabetes Education [facility].    Current diet order is carb modified, patient is consuming approximately n/a% of meals at this time. Labs and medications reviewed.   No nutrition interventions warranted at this time. If nutrition issues arise, please consult RD.   Loistine Chance, RD, LDN,  [place] Registered Dietitian II Certified Diabetes Care and Education Specialist Please refer to [facility] for RD and/or RD on-call/weekend/after hours pager

## 2022-05-01 NOTE — Assessment & Plan Note (Signed)
CBG currently within goal. -Continue Semglee 20 units daily -Continue moderate scale SSI

## 2022-05-01 NOTE — Hospital Course (Addendum)
Taken from prior notes.  Adam Merritt is a 76 y.o. male with medical history significant for diabetes mellitus with complications of stage III chronic kidney disease, coronary artery disease, hypertension, chronic lymphedema, diabetic neuropathy, GERD, history of venous insufficiency with bilateral lower extremity ulcerations who presents to the ER for evaluation of generalized weakness. At baseline patient is wheelchair-bound and is able to transfer but over the last 4 days wife notes increased weakness and difficulty with transfers. Patient also has tremors at rest, which started approximately a month ago.  Does not have a diagnosis of Parkinson's disease and according to the wife the [place] was supposed to be referring him to a neurologist but that has not happened.  Also found to have UTI with Enterococcus faecalis  sensitive to quinolones, penicillin and vancomycin S/p Levaquin, changed to amoxicillin 500 p.o. every 8 hourly. PT/OT recommending SNF-TOC is working on it  11/27: Hemodynamically stable.  Labs stable-consistent with CKD stage IIIb. Orthopedic surgery was consulted for concern of worsening knee pain.  Imaging consistent with early osteoarthritis, orthopedic is recommending conservative management with controlling pain, will not be candidate for NSAID due to CKD. They will consider steroid injection as outpatient once completed the course of antibiotic. Still no bed offers yet. Has bilateral Unna boot which will be replaced today.  11/28: Vitals and labs seems stable.  Mild hypokalemia with potassium of 3.4, repleting with only 20 mEq.  Still awaiting bed for SNF.  Unna boot was replaced yesterday.  11/29: Vitals and labs stable.  Still awaiting for SNF placement, now had a bed offer, pending insurance authorization.  11/30: Still awaiting insurance authorization.  12/1: Vitals and labs stable.  Obtained insurance authorization.  Patient is being discharged to rehab for further  management.  He will continue on current medications.  He will need Unna boot change weekly. Patient need to follow-up with his providers for further recommendations.

## 2022-05-01 NOTE — TOC Progression Note (Signed)
Transition of Care [facility]) - Progression Note    Patient Details  Name: Adam Merritt MRN: [id number] Date of Birth: [birth year]  Transition of Care [facility]) CM/SW [place], RN Phone Number: 05/01/2022, 2:34 PM  Clinical Narrative:     Wound care saw the patient today and changed the leg wraps, PASSR obtained [phone] A Resent the bedsearch no bed offers yet  Expected Discharge Plan: [place] Barriers to Discharge: Continued Medical Work up  Expected Discharge Plan and Services Expected Discharge Plan: [place]   Discharge Planning Services: CM Consult   Living arrangements for the past 2 months: Single Family Home                                       Social Determinants of Health (SDOH) Interventions    Readmission Risk Interventions     No data to display

## 2022-05-01 NOTE — Progress Notes (Signed)
Physical Therapy Treatment Patient Details Name: Adam Merritt MRN: [id number] DOB: 1945/06/08 Today's Date: 05/01/2022   History of Present Illness Pt is a 76 y/o male admitted secondary to generalized weakness, UTI and recurrent falls at home. PMH including but not limited to diabetes mellitus with complications of stage III chronic kidney disease, coronary artery disease, hypertension, chronic lymphedema, diabetic neuropathy, GERD, history of venous insufficiency with bilateral lower extremity ulcerations.    PT Comments    Patient supine in bed and agreeable to PT tx session. He demonstrates improved alertness and cognition this session with good insight into current deficits and requesting assistance appropriately. Required maxA for bed mobility for LE management and trunk elevation. Attempted to stand from elevated bed surface x 3 with maxA but unable to come into full upright standing. Continue to recommend SNF for ongoing Physical Therapy.      Recommendations for follow up therapy are one component of a multi-disciplinary discharge planning process, led by the attending physician.  Recommendations may be updated based on patient status, additional functional criteria and insurance authorization.  Follow Up Recommendations  Skilled nursing-short term rehab (<3 hours/day) Can patient physically be transported by private vehicle: No   Assistance Recommended at Discharge Frequent or constant Supervision/Assistance  Patient can return home with the following Two people to help with walking and/or transfers;Two people to help with bathing/dressing/bathroom;Assistance with cooking/housework;Assistance with feeding;Direct supervision/assist for medications management;Assist for transportation;Help with stairs or ramp for entrance   Equipment Recommendations  None recommended by PT    Recommendations for Other Services       Precautions / Restrictions Precautions Precautions:  Fall Restrictions Weight Bearing Restrictions: No     Mobility  Bed Mobility Overal bed mobility: Needs Assistance Bed Mobility: Supine to Sit, Sit to Supine     Supine to sit: Max assist, HOB elevated Sit to supine: Max assist   General bed mobility comments: maxA to complete bed mobility with assist for LEs and trunk elevation.    Transfers Overall transfer level: Needs assistance Equipment used: Rolling [place] (2 wheels) Transfers: Sit to/from Stand Sit to Stand: Max assist, From elevated surface           General transfer comment: attempted to stand from elevated EOB x 3 with maxA. Able to come into partial stand but not upright. Will need +2 to progress to full standing and OOB to chair    Ambulation/Gait                   Stairs             Wheelchair Mobility    Modified Rankin (Stroke Patients Only)       Balance Overall balance assessment: Needs assistance, History of Falls Sitting-balance support: Feet supported, Bilateral upper extremity supported, Single extremity supported Sitting balance-Leahy Scale: Poor Sitting balance - Comments: with fatigue, patient with posterior but after 20 minutes sitting EOB                                    Cognition Arousal/Alertness: Awake/alert Behavior During Therapy: WFL for tasks assessed/performed Overall Cognitive Status: Within Functional Limits for tasks assessed                                 General Comments: per wife, patient likes to joke at baseline. Good  insight into deficits and need for assistance        Exercises      General Comments        Pertinent Vitals/Pain Pain Assessment Pain Assessment: Faces Faces Pain Scale: Hurts little more Pain Location: bilateral LEs Pain Descriptors / Indicators: Grimacing, Guarding Pain Intervention(s): Monitored during session    Home Living                          Prior Function             PT Goals (current goals can now be found in the care plan section) Acute Rehab PT Goals Patient Stated Goal: to get stronger PT Goal Formulation: With patient/family Time For Goal Achievement: 05/13/22 Potential to Achieve Goals: Fair Progress towards PT goals: Progressing toward goals    Frequency    Min 2X/week      PT Plan Current plan remains appropriate    Co-evaluation              AM-PAC PT "6 Clicks" Mobility   Outcome Measure  Help needed turning from your back to your side while in a flat bed without using bedrails?: A Lot Help needed moving from lying on your back to sitting on the side of a flat bed without using bedrails?: Total Help needed moving to and from a bed to a chair (including a wheelchair)?: Total Help needed standing up from a chair using your arms (e.g., wheelchair or bedside chair)?: Total Help needed to walk in hospital room?: Total Help needed climbing 3-5 steps with a railing? : Total 6 Click Score: 7    End of Session Equipment Utilized During Treatment: Oxygen;Gait belt Activity Tolerance: Patient tolerated treatment well Patient left: in bed;with call bell/phone within reach;with bed alarm set;with family/visitor present Nurse Communication: Mobility status PT Visit Diagnosis: Other abnormalities of gait and mobility (R26.89)     Time: 8416-6063 PT Time Calculation (min) (ACUTE ONLY): 39 min  Charges:  $Therapeutic Activity: 38-52 mins                     Adam Merritt A. Gilford Rile PT, DPT [facility] - Acute Rehabilitation Services    Adam Merritt A Adam Merritt 05/01/2022, 12:53 PM

## 2022-05-02 ENCOUNTER — Ambulatory Visit: Payer: Medicare PPO | Admitting: Physician Assistant

## 2022-05-02 DIAGNOSIS — R531 Weakness: Secondary | ICD-10-CM | POA: Diagnosis not present

## 2022-05-02 LAB — BASIC METABOLIC PANEL
Anion gap: 8 (ref 5–15)
BUN: 27 mg/dL — ABNORMAL HIGH (ref 8–23)
CO2: 27 mmol/L (ref 22–32)
Calcium: 8.3 mg/dL — ABNORMAL LOW (ref 8.9–10.3)
Chloride: 103 mmol/L (ref 98–111)
Creatinine, Ser: 1.78 mg/dL — ABNORMAL HIGH (ref 0.61–1.24)
GFR, Estimated: 39 mL/min — ABNORMAL LOW (ref >60)
Glucose, Bld: 92 mg/dL (ref 70–99)
Potassium: 3.4 mmol/L — ABNORMAL LOW (ref 3.5–5.1)
Sodium: 138 mmol/L (ref 135–145)

## 2022-05-02 LAB — PHOSPHORUS: Phosphorus: 3.7 mg/dL (ref 2.5–4.6)

## 2022-05-02 LAB — CBC
HCT: 33.6 % — ABNORMAL LOW (ref 39.0–52.0)
Hemoglobin: 11 g/dL — ABNORMAL LOW (ref 13.0–17.0)
MCH: 29.5 pg (ref 26.0–34.0)
MCHC: 32.7 g/dL (ref 30.0–36.0)
MCV: 90.1 fL (ref 80.0–100.0)
Platelets: 155 10*3/uL (ref 150–400)
RBC: 3.73 MIL/uL — ABNORMAL LOW (ref 4.22–5.81)
RDW: 13.4 % (ref 11.5–15.5)
WBC: 7 10*3/uL (ref 4.0–10.5)
nRBC: 0 % (ref 0.0–0.2)

## 2022-05-02 LAB — GLUCOSE, CAPILLARY
Glucose-Capillary: 102 mg/dL — ABNORMAL HIGH (ref 70–99)
Glucose-Capillary: 155 mg/dL — ABNORMAL HIGH (ref 70–99)
Glucose-Capillary: 85 mg/dL (ref 70–99)
Glucose-Capillary: 101 mg/dL — ABNORMAL HIGH (ref 70–99)

## 2022-05-02 LAB — MAGNESIUM: Magnesium: 2.4 mg/dL (ref 1.7–2.4)

## 2022-05-02 MED ORDER — POTASSIUM CHLORIDE CRYS ER 20 MEQ PO TBCR
20.0000 meq | EXTENDED_RELEASE_TABLET | Freq: Once | ORAL | Status: AC
  Administered 2022-05-02: 20 meq via ORAL
  Filled 2022-05-02: qty 1

## 2022-05-02 NOTE — Progress Notes (Signed)
Progress Note   Patient: Adam Merritt [id number] DOB: [birth year] DOA: 04/27/2022     0 DOS: the patient was seen and examined on 05/02/2022   Brief hospital course: Taken from prior notes.  Adam Merritt is a 76 y.o. male with medical history significant for diabetes mellitus with complications of stage III chronic kidney disease, coronary artery disease, hypertension, chronic lymphedema, diabetic neuropathy, GERD, history of venous insufficiency with bilateral lower extremity ulcerations who presents to the ER for evaluation of generalized weakness. At baseline patient is wheelchair-bound and is able to transfer but over the last 4 days wife notes increased weakness and difficulty with transfers. Patient also has tremors at rest, which started approximately a month ago.  Does not have a diagnosis of Parkinson's disease and according to the wife the [place] was supposed to be referring him to a neurologist but that has not happened.  Also found to have UTI with Enterococcus faecalis  sensitive to quinolones, penicillin and vancomycin S/p Levaquin, changed to amoxicillin 500 p.o. every 8 hourly. PT/OT recommending SNF-TOC is working on it  11/27: Hemodynamically stable.  Labs stable-consistent with CKD stage IIIb. Orthopedic surgery was consulted for concern of worsening knee pain.  Imaging consistent with early osteoarthritis, orthopedic is recommending conservative management with controlling pain, will not be candidate for NSAID due to CKD. They will consider steroid injection as outpatient once completed the course of antibiotic. Still no bed offers yet. Has bilateral Unna boot which will be replaced today.  11/28: Vitals and labs seems stable.  Mild hypokalemia with potassium of 3.4, repleting with only 20 mEq.  Still awaiting bed for SNF.  Unna boot was replaced yesterday.  Assessment and Plan: * General weakness Patient presents to the ER for evaluation of generalized weakness and  recurrent falls at home. Per wife he is currently wheelchair-bound and is unable to transfer. He is requiring increased assistance with ADLs PT is recommending SNF-no bed offer yet  Recurrent falls Secondary to generalized weakness Patient on fall precautions PT evaluated him and recommending SNF   UTI (urinary tract infection) Patient noted to have pyuria which may be contributing to his weakness However urine culture yielded Enterococcus faecalis sensitive to quinolones, penicillin and vancomycin Patient initially received Levaquin which was changed to amoxicillin 500 mg every 8 hourly to complete a 5-day course  CKD stage 3 due to type 2 diabetes mellitus ([place]) Patient has diabetes mellitus with complications of stage IIIB chronic kidney disease which is stable- -monitor renal function -Avoid left toxins  Chronic diastolic CHF (congestive heart failure) (HCC) Stable and not acutely exacerbated Continue furosemide, spironolactone and carvedilol  Essential hypertension Blood pressure within goal. -Continue carvedilol, furosemide and spironolactone  COPD (chronic obstructive pulmonary disease) (HCC) Stable and not acutely exacerbated Continue as needed bronchodilator therapy and inhaled steroids  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 ([place]) Patient has chronic lower extremity venous stasis with ulceration Wound care was also consulted. -Change Unna boot  Depression Continue Zoloft and trazodone  Type 2 diabetes mellitus with hyperglycemia (HCC) CBG currently within goal. -Continue Semglee 20 units daily -Continue moderate scale SSI  Diabetic neuropathy (HCC) -Continue gabapentin  Anemia Anemia panel with low iron and B12 along with borderline folic acid. -Patient was started on iron, folic acid and J82 supplement. Currently taking IM B12 and can be discharged on p.o.  Mixed hyperlipidemia -Continue Lipitor  Obesity, Class III, BMI 40-49.9 (morbid  obesity) ([place]) Estimated body mass index is 46.16 kg/m  as calculated from the following:   Height as of this encounter: '5\' 6"'$  (1.676 m).   Weight as of this encounter: 129.7 kg.   -This will complicate overall prognosis   Subjective: Patient was seen and examined today.  No new complaints.  Waiting for rehab placement.  Physical Exam: Vitals:   05/01/22 2051 05/02/22 0414 05/02/22 1023 05/02/22 1525  BP: (!) 118/52 133/64 121/88 (!) 117/52  Pulse: (!) 56 (!) 57 (!) 59 62  Resp: '18 20 17 16  '$ Temp: 97.8 F (36.6 C) 97.7 F (36.5 C) 98.1 F (36.7 C) 98.4 F (36.9 C)  TempSrc:      SpO2: 100% 99% 97% 99%  Weight:      Height:       General.  Morbidly obese gentleman, in no acute distress. Pulmonary.  Lungs clear bilaterally, normal respiratory effort. CV.  Regular rate and rhythm, no JVD, rub or murmur. Abdomen.  Soft, nontender, nondistended, BS positive. CNS.  Alert and oriented .  No focal neurologic deficit. Extremities.  1+ LE edema, no cyanosis, pulses intact and symmetrical.  Unna boot in place Psychiatry.  Judgment and insight appears normal.   Data Reviewed: Prior data reviewed.  Family Communication:   Disposition: Status is: Observation The patient remains OBS appropriate and will d/c before 2 midnights.  Planned Discharge Destination: Skilled nursing facility  Time spent: 39 minutes  This record has been created using Systems analyst. Errors have been sought and corrected,but may not always be located. Such creation errors do not reflect on the standard of care.   Author: Lorella Nimrod, MD 05/02/2022 4:35 PM  For on call review [email or web address].

## 2022-05-02 NOTE — Assessment & Plan Note (Signed)
Patient noted to have pyuria which may be contributing to his weakness However urine culture yielded Enterococcus faecalis sensitive to quinolones, penicillin and vancomycin Patient initially received Levaquin which was changed to amoxicillin 500 mg every 8 hourly to complete a 5-day course

## 2022-05-02 NOTE — Consult Note (Addendum)
WOC Nurse Consult Note: Pt is followed by the outpatient wound care center and has 4 layer compression wraps changed Q week, according to progress notes in the EMR.  Removed compression wraps and washed legs. There are no open wounds or drainage.  Dry scabbed flaky skin with moderate edema bilat.  Reapplied 4 layer Profore compression wraps. These will need to be changed weekly for edema control if patient is transferred to another facility after discharge. Topical treatment orders provided for bedside nurses as follows: Leave 4 layer compression wraps in place (Profore), [place] will change Q Tues while in the hospital. Thank-you,  Julien Girt MSN, [place], [place], [place], [place]

## 2022-05-03 DIAGNOSIS — R531 Weakness: Secondary | ICD-10-CM | POA: Diagnosis not present

## 2022-05-03 LAB — CBC
HCT: 33.6 % — ABNORMAL LOW (ref 39.0–52.0)
Hemoglobin: 11.1 g/dL — ABNORMAL LOW (ref 13.0–17.0)
MCH: 29.8 pg (ref 26.0–34.0)
MCHC: 33 g/dL (ref 30.0–36.0)
MCV: 90.3 fL (ref 80.0–100.0)
Platelets: 158 10*3/uL (ref 150–400)
RBC: 3.72 MIL/uL — ABNORMAL LOW (ref 4.22–5.81)
RDW: 13.5 % (ref 11.5–15.5)
WBC: 7.7 10*3/uL (ref 4.0–10.5)
nRBC: 0 % (ref 0.0–0.2)

## 2022-05-03 LAB — GLUCOSE, CAPILLARY
Glucose-Capillary: 100 mg/dL — ABNORMAL HIGH (ref 70–99)
Glucose-Capillary: 122 mg/dL — ABNORMAL HIGH (ref 70–99)
Glucose-Capillary: 106 mg/dL — ABNORMAL HIGH (ref 70–99)

## 2022-05-03 LAB — BASIC METABOLIC PANEL
Anion gap: 4 — ABNORMAL LOW (ref 5–15)
BUN: 29 mg/dL — ABNORMAL HIGH (ref 8–23)
CO2: 26 mmol/L (ref 22–32)
Calcium: 8.5 mg/dL — ABNORMAL LOW (ref 8.9–10.3)
Chloride: 106 mmol/L (ref 98–111)
Creatinine, Ser: 1.75 mg/dL — ABNORMAL HIGH (ref 0.61–1.24)
GFR, Estimated: 40 mL/min — ABNORMAL LOW (ref >60)
Glucose, Bld: 92 mg/dL (ref 70–99)
Potassium: 3.9 mmol/L (ref 3.5–5.1)
Sodium: 136 mmol/L (ref 135–145)

## 2022-05-03 LAB — PHOSPHORUS: Phosphorus: 3.1 mg/dL (ref 2.5–4.6)

## 2022-05-03 LAB — MAGNESIUM: Magnesium: 2.3 mg/dL (ref 1.7–2.4)

## 2022-05-03 MED ORDER — POLYETHYLENE GLYCOL 3350 17 G PO PACK
17.0000 g | PACK | Freq: Every day | ORAL | Status: DC
  Administered 2022-05-03: 17 g via ORAL
  Filled 2022-05-03 (×3): qty 1

## 2022-05-03 MED ORDER — BISACODYL 10 MG RE SUPP
10.0000 mg | Freq: Once | RECTAL | Status: AC
  Administered 2022-05-03: 10 mg via RECTAL
  Filled 2022-05-03: qty 1

## 2022-05-03 NOTE — Progress Notes (Signed)
Physical Therapy Treatment Patient Details Name: Adam Merritt MRN: [id number] DOB: May 10, 1946 Today's Date: 05/03/2022   History of Present Illness Pt is a 76 y/o male admitted secondary to generalized weakness, UTI and recurrent falls at home. PMH including but not limited to diabetes mellitus with complications of stage III chronic kidney disease, coronary artery disease, hypertension, chronic lymphedema, diabetic neuropathy, GERD, history of venous insufficiency with bilateral lower extremity ulcerations.    PT Comments    Patient progressing slowly towards goals. He continues to demonstrate overall weakness, decreased activity tolerance, impaired mobility and remains at high risk of falls. Today, patient found semi reclined in bed, BLE's wrapped. Patient complained of 6/10 generalized pain in BLE's. Patient mainly concerned of where his TV remote was. Patient able to perform partial rolling in bed for repositioning with maximum assist, with cues to utilize bed rails. Patient agreeable to performing supine LE exercises for strengthening, including AROM ankle pumps, glute sets, quad sets, hip abduction/adduction. AAROM short arc quads, patient only able to get partial range secondary to leg pain/edema. Patient fatigued quickly and required frequent rest breaks. Continue to progress as tolerated.   Recommendations for follow up therapy are one component of a multi-disciplinary discharge planning process, led by the attending physician.  Recommendations may be updated based on patient status, additional functional criteria and insurance authorization.  Follow Up Recommendations  Skilled nursing-short term rehab (<3 hours/day) Can patient physically be transported by private vehicle: No   Assistance Recommended at Discharge Frequent or constant Supervision/Assistance  Patient can return home with the following Two people to help with walking and/or transfers;Two people to help with  bathing/dressing/bathroom;Assistance with cooking/housework;Assistance with feeding;Direct supervision/assist for medications management;Assist for transportation;Help with stairs or ramp for entrance   Equipment Recommendations  None recommended by PT    Recommendations for Other Services       Precautions / Restrictions Precautions Precautions: Fall (obesity) Restrictions Weight Bearing Restrictions: No     Mobility  Bed Mobility Overal bed mobility: Needs Assistance Bed Mobility: Rolling (partial rolling, repositioning) Rolling: Max assist         General bed mobility comments: max A partial rolling/repositioning, cues to utilize bedrails    Transfers                        Ambulation/Gait Ambulation/Gait assistance:  (deferred)                 Stairs             Wheelchair Mobility    Modified Rankin (Stroke Patients Only)       Balance Overall balance assessment: Needs assistance, History of Falls (balance assessed with back support from bed)                                          Cognition Arousal/Alertness: Awake/alert Behavior During Therapy: WFL for tasks assessed/performed Overall Cognitive Status: Within Functional Limits for tasks assessed                                          Exercises General Exercises - Lower Extremity Ankle Circles/Pumps: AROM, Both, 10 reps, Supine Quad Sets: AROM, Both, 10 reps, Supine Gluteal Sets: AROM, Both, 10 reps, Supine Short Arc Quad:  AROM, Both, 5 reps, Supine Hip ABduction/ADduction: AROM, Both, 10 reps, Supine    General Comments General comments (skin integrity, edema, etc.): patient with wrapped BLE's, edemateous legs      Pertinent Vitals/Pain Pain Assessment Pain Assessment: 0-10 Pain Score: 6  Pain Location: bilateral LEs (generalized) Pain Descriptors / Indicators: Aching, Discomfort Pain Intervention(s): Monitored during session,  Limited activity within patient's tolerance    Home Living                          Prior Function            PT Goals (current goals can now be found in the care plan section) Acute Rehab PT Goals Patient Stated Goal: to get stronger PT Goal Formulation: With patient Time For Goal Achievement: 05/13/22 Potential to Achieve Goals: Fair Progress towards PT goals: Progressing toward goals    Frequency    Min 2X/week      PT Plan Current plan remains appropriate    Co-evaluation              AM-PAC PT "6 Clicks" Mobility   Outcome Measure  Help needed turning from your back to your side while in a flat bed without using bedrails?: A Lot Help needed moving from lying on your back to sitting on the side of a flat bed without using bedrails?: Total Help needed moving to and from a bed to a chair (including a wheelchair)?: Total Help needed standing up from a chair using your arms (e.g., wheelchair or bedside chair)?: Total Help needed to walk in hospital room?: Total Help needed climbing 3-5 steps with a railing? : Total 6 Click Score: 7    End of Session   Activity Tolerance: Patient limited by pain;Patient limited by fatigue Patient left: in bed;with call bell/phone within reach;with bed alarm set Nurse Communication: Mobility status PT Visit Diagnosis: Other abnormalities of gait and mobility (R26.89);Muscle weakness (generalized) (M62.81)     Time: 7096-2836 PT Time Calculation (min) (ACUTE ONLY): 20 min  Charges:  $Therapeutic Exercise: 8-22 mins                     Clemetine Marker, PT, DPT, CCS, GCS    Derrell Lolling 05/03/2022, 2:57 PM

## 2022-05-03 NOTE — TOC Progression Note (Addendum)
Transition of Care [facility]) - Progression Note    Patient Details  Name: Adam Merritt MRN: [id number] Date of Birth: 10-Feb-1946  Transition of Care [facility]) CM/SW [place], RN Phone Number: 05/03/2022, 11:31 AM  Clinical Narrative:     Met with the patient and explained we have one bed offer and it is [facility], he asked to accept the bed offer, I accepted and notified Debra at [facility], Ins will be started once updated PT notes are in  Expected Discharge Plan: [place] Barriers to Discharge: Continued Medical Work up  Expected Discharge Plan and Services Expected Discharge Plan: [place]   Discharge Planning Services: CM Consult   Living arrangements for the past 2 months: Single Family Home                                       Social Determinants of Health (SDOH) Interventions    Readmission Risk Interventions     No data to display

## 2022-05-03 NOTE — Plan of Care (Signed)
  Problem: Education: Goal: Ability to describe self-care measures that may prevent or decrease complications (Diabetes Survival Skills Education) will improve Outcome: Progressing   Problem: Coping: Goal: Ability to adjust to condition or change in health will improve Outcome: Progressing   Problem: Fluid Volume: Goal: Ability to maintain a balanced intake and output will improve Outcome: Progressing   Problem: Health Behavior/Discharge Planning: Goal: Ability to identify and utilize available resources and services will improve Outcome: Progressing   Problem: Metabolic: Goal: Ability to maintain appropriate glucose levels will improve Outcome: Progressing   Problem: Skin Integrity: Goal: Risk for impaired skin integrity will decrease Outcome: Progressing   Problem: Nutrition: Goal: Adequate nutrition will be maintained Outcome: Progressing   Problem: Coping: Goal: Level of anxiety will decrease Outcome: Progressing   Problem: Skin Integrity: Goal: Risk for impaired skin integrity will decrease Outcome: Progressing   Problem: Safety: Goal: Ability to remain free from injury will improve Outcome: Progressing

## 2022-05-03 NOTE — TOC Progression Note (Signed)
Transition of Care [facility]) - Progression Note    Patient Details  Name: Adam Merritt MRN: [id number] Date of Birth: 1946/04/14  Transition of Care [facility]) CM/SW [place], RN Phone Number: 05/03/2022, 3:33 PM  Clinical Narrative:     Patient Ins process started to go to [facility], Ref number [id number], Josem Kaufmann pending  Expected Discharge Plan: [place] Barriers to Discharge: Continued Medical Work up  Expected Discharge Plan and Services Expected Discharge Plan: [place]   Discharge Planning Services: CM Consult   Living arrangements for the past 2 months: Single Family Home                                       Social Determinants of Health (SDOH) Interventions    Readmission Risk Interventions     No data to display

## 2022-05-03 NOTE — Progress Notes (Signed)
Progress Note   Patient: Adam Merritt WUJ:[id number] DOB: April 17, 1946 DOA: 04/27/2022     0 DOS: the patient was seen and examined on 05/03/2022   Brief hospital course: Taken from prior notes.  Adam Merritt is a 76 y.o. male with medical history significant for diabetes mellitus with complications of stage III chronic kidney disease, coronary artery disease, hypertension, chronic lymphedema, diabetic neuropathy, GERD, history of venous insufficiency with bilateral lower extremity ulcerations who presents to the ER for evaluation of generalized weakness. At baseline patient is wheelchair-bound and is able to transfer but over the last 4 days wife notes increased weakness and difficulty with transfers. Patient also has tremors at rest, which started approximately a month ago.  Does not have a diagnosis of Parkinson's disease and according to the wife the [place] was supposed to be referring him to a neurologist but that has not happened.  Also found to have UTI with Enterococcus faecalis  sensitive to quinolones, penicillin and vancomycin S/p Levaquin, changed to amoxicillin 500 p.o. every 8 hourly. PT/OT recommending SNF-TOC is working on it  11/27: Hemodynamically stable.  Labs stable-consistent with CKD stage IIIb. Orthopedic surgery was consulted for concern of worsening knee pain.  Imaging consistent with early osteoarthritis, orthopedic is recommending conservative management with controlling pain, will not be candidate for NSAID due to CKD. They will consider steroid injection as outpatient once completed the course of antibiotic. Still no bed offers yet. Has bilateral Unna boot which will be replaced today.  11/28: Vitals and labs seems stable.  Mild hypokalemia with potassium of 3.4, repleting with only 20 mEq.  Still awaiting bed for SNF.  Unna boot was replaced yesterday.  11/29: Vitals and labs stable.  Still awaiting for SNF placement, now had a bed offer, pending insurance  authorization  Assessment and Plan: * General weakness Patient presents to the ER for evaluation of generalized weakness and recurrent falls at home. Per wife he is currently wheelchair-bound and is unable to transfer. He is requiring increased assistance with ADLs PT is recommending SNF-no bed offer yet  Recurrent falls Secondary to generalized weakness Patient on fall precautions PT evaluated him and recommending SNF   UTI (urinary tract infection) Patient noted to have pyuria which may be contributing to his weakness However urine culture yielded Enterococcus faecalis sensitive to quinolones, penicillin and vancomycin Patient initially received Levaquin which was changed to amoxicillin 500 mg every 8 hourly to complete a 5-day course  CKD stage 3 due to type 2 diabetes mellitus ([name]) Patient has diabetes mellitus with complications of stage IIIB chronic kidney disease which is stable- -monitor renal function -Avoid left toxins  Chronic diastolic CHF (congestive heart failure) (HCC) Stable and not acutely exacerbated Continue furosemide, spironolactone and carvedilol  Essential hypertension Blood pressure within goal. 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 -Change Unna boot  Depression Continue Zoloft and trazodone  Type 2 diabetes mellitus with hyperglycemia (HCC) CBG currently within goal. -Continue Semglee 20 units daily -Continue moderate scale SSI  Diabetic neuropathy (HCC) -Continue gabapentin  Anemia Anemia panel with low iron and B12 along with borderline folic acid. -Patient was started on iron, folic acid and N56 supplement. Currently taking  IM B12 and can be discharged on p.o.  Mixed hyperlipidemia -Continue Lipitor  Obesity, Class III, BMI 40-49.9 (morbid obesity) ([name]) Estimated body mass index is 46.16 kg/m as calculated from the following:   Height as of this encounter: '5\' 6"'$  (1.676 m).   Weight as of this encounter: 129.7 kg.   -This will complicate overall prognosis   Subjective: Patient with no new complaints except that the hearing aid is not working today.  Physical Exam: Vitals:   05/02/22 1525 05/02/22 2359 05/03/22 0807 05/03/22 1627  BP: (!) 117/52 (!) 125/54 (!) 126/58 127/61  Pulse: 62 (!) 56 (!) 59 (!) 58  Resp: 16 18    Temp: 98.4 F (36.9 C) 98.3 F (36.8 C) 97.8 F (36.6 C) 98.5 F (36.9 C)  TempSrc:      SpO2: 99% 97% 93% 98%  Weight:      Height:       General.  Obese gentleman, in no acute distress. Pulmonary.  Lungs clear bilaterally, normal respiratory effort. CV.  Regular rate and rhythm, no JVD, rub or murmur. Abdomen.  Soft, nontender, nondistended, BS positive. CNS.  Alert and oriented .  No focal neurologic deficit. Extremities.  Trace LE edema, no cyanosis, pulses intact and symmetrical.  Unna boot in place Psychiatry.  Judgment and insight appears normal.   Data Reviewed: Prior data reviewed.  Family Communication:   Disposition: Status is: Observation The patient remains OBS appropriate and will d/c before 2 midnights.  Planned Discharge Destination: Skilled nursing facility  Time spent: 36 minutes  This record has been created using Systems analyst. Errors have been sought and corrected,but may not always be located. Such creation errors do not reflect on the standard of care.   Author: Lorella Nimrod, MD 05/03/2022 5:03 PM  For on call review [email or web address].

## 2022-05-04 DIAGNOSIS — R531 Weakness: Secondary | ICD-10-CM | POA: Diagnosis not present

## 2022-05-04 LAB — BASIC METABOLIC PANEL
Anion gap: 7 (ref 5–15)
BUN: 28 mg/dL — ABNORMAL HIGH (ref 8–23)
CO2: 27 mmol/L (ref 22–32)
Calcium: 8.5 mg/dL — ABNORMAL LOW (ref 8.9–10.3)
Chloride: 105 mmol/L (ref 98–111)
Creatinine, Ser: 1.58 mg/dL — ABNORMAL HIGH (ref 0.61–1.24)
GFR, Estimated: 45 mL/min — ABNORMAL LOW (ref >60)
Glucose, Bld: 92 mg/dL (ref 70–99)
Potassium: 3.9 mmol/L (ref 3.5–5.1)
Sodium: 139 mmol/L (ref 135–145)

## 2022-05-04 LAB — CBC
HCT: 35.5 % — ABNORMAL LOW (ref 39.0–52.0)
Hemoglobin: 11.3 g/dL — ABNORMAL LOW (ref 13.0–17.0)
MCH: 29 pg (ref 26.0–34.0)
MCHC: 31.8 g/dL (ref 30.0–36.0)
MCV: 91.3 fL (ref 80.0–100.0)
Platelets: 167 10*3/uL (ref 150–400)
RBC: 3.89 MIL/uL — ABNORMAL LOW (ref 4.22–5.81)
RDW: 13.7 % (ref 11.5–15.5)
WBC: 6.9 10*3/uL (ref 4.0–10.5)
nRBC: 0 % (ref 0.0–0.2)

## 2022-05-04 LAB — GLUCOSE, CAPILLARY
Glucose-Capillary: 108 mg/dL — ABNORMAL HIGH (ref 70–99)
Glucose-Capillary: 129 mg/dL — ABNORMAL HIGH (ref 70–99)
Glucose-Capillary: 129 mg/dL — ABNORMAL HIGH (ref 70–99)
Glucose-Capillary: 112 mg/dL — ABNORMAL HIGH (ref 70–99)

## 2022-05-04 NOTE — Plan of Care (Signed)
  Problem: Metabolic: Goal: Ability to maintain appropriate glucose levels will improve Outcome: Progressing   Problem: Skin Integrity: Goal: Risk for impaired skin integrity will decrease Outcome: Progressing   Problem: Activity: Goal: Risk for activity intolerance will decrease Outcome: Progressing   Problem: Nutrition: Goal: Adequate nutrition will be maintained Outcome: Progressing

## 2022-05-04 NOTE — Progress Notes (Signed)
Progress Note   Patient: Adam Merritt DOB: 09/26/1945 DOA: 04/27/2022     0 DOS: the patient was seen and examined on 05/04/2022   Brief hospital course: Taken from prior notes.  Adam Merritt is a 76 y.o. male with medical history significant for diabetes mellitus with complications of stage III chronic kidney disease, coronary artery disease, hypertension, chronic lymphedema, diabetic neuropathy, GERD, history of venous insufficiency with bilateral lower extremity ulcerations who presents to the ER for evaluation of generalized weakness. At baseline patient is wheelchair-bound and is able to transfer but over the last 4 days wife notes increased weakness and difficulty with transfers. Patient also has tremors at rest, which started approximately a month ago.  Does not have a diagnosis of Parkinson's disease and according to the wife the [place] was supposed to be referring him to a neurologist but that has not happened.  Also found to have UTI with Enterococcus faecalis  sensitive to quinolones, penicillin and vancomycin S/p Levaquin, changed to amoxicillin 500 p.o. every 8 hourly. PT/OT recommending SNF-TOC is working on it  11/27: Hemodynamically stable.  Labs stable-consistent with CKD stage IIIb. Orthopedic surgery was consulted for concern of worsening knee pain.  Imaging consistent with early osteoarthritis, orthopedic is recommending conservative management with controlling pain, will not be candidate for NSAID due to CKD. They will consider steroid injection as outpatient once completed the course of antibiotic. Still no bed offers yet. Has bilateral Unna boot which will be replaced today.  11/28: Vitals and labs seems stable.  Mild hypokalemia with potassium of 3.4, repleting with only 20 mEq.  Still awaiting bed for SNF.  Unna boot was replaced yesterday.  11/29: Vitals and labs stable.  Still awaiting for SNF placement, now had a bed offer, pending insurance  authorization.  11/30: Still awaiting insurance authorization.  Assessment and Plan: * General weakness Patient presents to the ER for evaluation of generalized weakness and recurrent falls at home. Per wife he is currently wheelchair-bound and is unable to transfer. He is requiring increased assistance with ADLs PT is recommending SNF-no bed offer yet  Recurrent falls Secondary to generalized weakness Patient on fall precautions PT evaluated him and recommending SNF   UTI (urinary tract infection) Patient noted to have pyuria which may be contributing to his weakness However urine culture yielded Enterococcus faecalis sensitive to quinolones, penicillin and vancomycin Patient initially received Levaquin which was changed to amoxicillin 500 mg every 8 hourly to complete a 5-day course  CKD stage 3 due to type 2 diabetes mellitus ([place]) Patient has diabetes mellitus with complications of stage IIIB chronic kidney disease which is stable- -monitor renal function -Avoid left toxins  Chronic diastolic CHF (congestive heart failure) (HCC) Stable and not acutely exacerbated Continue furosemide, spironolactone and carvedilol  Essential hypertension Blood pressure within goal. 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 -Change Unna boot  Depression Continue Zoloft and trazodone  Type 2 diabetes mellitus with hyperglycemia (HCC) CBG currently within goal. -Continue Semglee 20 units daily -Continue moderate scale SSI  Diabetic neuropathy (HCC) -Continue gabapentin  Anemia Anemia panel with low iron and B12 along with borderline folic acid. -Patient was started on  iron, folic acid and P79 supplement. Currently taking IM B12 and  can be discharged on p.o.  Mixed hyperlipidemia -Continue Lipitor  Obesity, Class III, BMI 40-49.9 (morbid obesity) ([place]) Estimated body mass index is 46.16 kg/m as calculated from the following:   Height as of this encounter: '5\' 6"'$  (1.676 m).   Weight as of this encounter: 129.7 kg.   -This will complicate overall prognosis   Subjective: Patient was seen and examined today.  No new complaint.  He was very frustrated about insurance authorization taking that long.  He wants to get out of the hospital  Physical Exam: Vitals:   05/02/22 2359 05/03/22 0807 05/03/22 1627 05/04/22 0835  BP: (!) 125/54 (!) 126/58 127/61 139/61  Pulse: (!) 56 (!) 59 (!) 58 65  Resp: 18   17  Temp: 98.3 F (36.8 C) 97.8 F (36.6 C) 98.5 F (36.9 C) 98.1 F (36.7 C)  TempSrc:      SpO2: 97% 93% 98% 99%  Weight:      Height:       General.  Obese gentleman, in no acute distress. Pulmonary.  Lungs clear bilaterally, normal respiratory effort. CV.  Regular rate and rhythm, no JVD, rub or murmur. Abdomen.  Soft, nontender, nondistended, BS positive. CNS.  Alert and oriented .  No focal neurologic deficit. Extremities.  1+ LE edema, no cyanosis, pulses intact and symmetrical.  Bilateral Unna boot in place Psychiatry.  Judgment and insight appears normal.    Data Reviewed: Prior data reviewed.  Family Communication: Discussed with wife at bedside  Disposition: Status is: Observation The patient remains OBS appropriate and will d/c before 2 midnights.  Planned Discharge Destination: Skilled nursing facility  Time spent: 35 minutes  This record has been created using Systems analyst. Errors have been sought and corrected,but may not always be located. Such creation errors do not reflect on the standard of care.   Author: Lorella Nimrod, MD 05/04/2022 3:23 PM  For on call review [email or web address].

## 2022-05-04 NOTE — TOC Progression Note (Signed)
Transition of Care [facility]) - Progression Note    Patient Details  Name: Adam Merritt MRN: [id number] Date of Birth: 07-Mar-1946  Transition of Care [facility]) CM/SW [place], RN Phone Number: 05/04/2022, 1:20 PM  Clinical Narrative:    Checked Ins auth status, still pending   Expected Discharge Plan: [place] Barriers to Discharge: Continued Medical Work up  Expected Discharge Plan and Services Expected Discharge Plan: [place]   Discharge Planning Services: CM Consult   Living arrangements for the past 2 months: Single Family Home                                       Social Determinants of Health (SDOH) Interventions    Readmission Risk Interventions     No data to display

## 2022-05-04 NOTE — Plan of Care (Signed)
  Problem: Education: Goal: Knowledge of General Education information will improve Description: Including pain rating scale, medication(s)/side effects and non-pharmacologic comfort measures Outcome: Progressing   Problem: Health Behavior/Discharge Planning: Goal: Ability to manage health-related needs will improve Outcome: Progressing   Problem: Clinical Measurements: Goal: Ability to maintain clinical measurements within normal limits will improve Outcome: Progressing Goal: Diagnostic test results will improve Outcome: Progressing Goal: Cardiovascular complication will be avoided Outcome: Progressing   Problem: Nutrition: Goal: Adequate nutrition will be maintained Outcome: Progressing   Problem: Elimination: Goal: Will not experience complications related to bowel motility Outcome: Progressing Goal: Will not experience complications related to urinary retention Outcome: Progressing

## 2022-05-04 NOTE — TOC Progression Note (Signed)
Transition of Care [facility]) - Progression Note    Patient Details  Name: Adam Merritt MRN: [id number] Date of Birth: [birth year]  Transition of Care [facility]) CM/SW [place], RN Phone Number: 05/04/2022, 10:36 AM  Clinical Narrative:    Checked the status of the Ins auth, it is still pending ref number [id number]   Expected Discharge Plan: [place] Barriers to Discharge: Continued Medical Work up  Expected Discharge Plan and Services Expected Discharge Plan: [place]   Discharge Planning Services: CM Consult   Living arrangements for the past 2 months: Single Family Home                                       Social Determinants of Health (SDOH) Interventions    Readmission Risk Interventions     No data to display

## 2022-05-04 NOTE — Progress Notes (Addendum)
Physical Therapy Treatment Patient Details Name: Adam Merritt MRN: [id number] DOB: Apr 12, 1946 Today's Date: 05/04/2022   History of Present Illness Pt is a 76 y/o male admitted secondary to generalized weakness, UTI and recurrent falls at home. PMH including but not limited to diabetes mellitus with complications of stage III chronic kidney disease, coronary artery disease, hypertension, chronic lymphedema, diabetic neuropathy, GERD, history of venous insufficiency with bilateral lower extremity ulcerations.    PT Comments    Patient received in bed, he reports he is not feeling well, but agrees to PT session. He requires +2 assist for bed mobility. Poor sitting balance tolerance, requiring single to bilateral UE support to maintain sitting balance. Patient will continue to benefit from skilled PT to improve strength and functional independence.      Recommendations for follow up therapy are one component of a multi-disciplinary discharge planning process, led by the attending physician.  Recommendations may be updated based on patient status, additional functional criteria and insurance authorization.  Follow Up Recommendations  Skilled nursing-short term rehab (<3 hours/day) Can patient physically be transported by private vehicle: No   Assistance Recommended at Discharge Frequent or constant Supervision/Assistance  Patient can return home with the following Two people to help with walking and/or transfers;Two people to help with bathing/dressing/bathroom;Assistance with cooking/housework;Assistance with feeding;Direct supervision/assist for medications management;Assist for transportation;Help with stairs or ramp for entrance   Equipment Recommendations  None recommended by PT;Other (comment) (TBD)    Recommendations for Other Services       Precautions / Restrictions Precautions Precautions: Fall Precaution Comments: Una boots bilaterally Restrictions Weight Bearing  Restrictions: No     Mobility  Bed Mobility Overal bed mobility: Needs Assistance Bed Mobility: Supine to Sit, Sit to Supine     Supine to sit: Max assist, +2 for physical assistance, HOB elevated Sit to supine: Max assist, +2 for physical assistance   General bed mobility comments: patient requires 1-2 UE support to maintain sitting balance. Leaning to his right in sitting.    Transfers                   General transfer comment: not attempted, patient with poor tolerance for sitting edge of bed.    Ambulation/Gait                   Stairs             Wheelchair Mobility    Modified Rankin (Stroke Patients Only)       Balance Overall balance assessment: Needs assistance Sitting-balance support: Feet supported, Bilateral upper extremity supported Sitting balance-Leahy Scale: Poor Sitting balance - Comments: poor tolerance, fatigued                                    Cognition Arousal/Alertness: Awake/alert Behavior During Therapy: WFL for tasks assessed/performed Overall Cognitive Status: Within Functional Limits for tasks assessed                                          Exercises  AP, heel slides, hip abd/add x 10 reps each with assistance.     General Comments        Pertinent Vitals/Pain Pain Assessment Pain Assessment: No/denies pain Pain Location: reported right knee pain prior to movement, but did not report pain  with therex or mobility Pain Intervention(s): Monitored during session, Repositioned    Home Living                          Prior Function            PT Goals (current goals can now be found in the care plan section) Acute Rehab PT Goals Patient Stated Goal: to get stronger, go home PT Goal Formulation: With patient Time For Goal Achievement: 05/13/22 Potential to Achieve Goals: Fair Progress towards PT goals: Progressing toward goals    Frequency    Min  2X/week      PT Plan Current plan remains appropriate    Co-evaluation              AM-PAC PT "6 Clicks" Mobility   Outcome Measure  Help needed turning from your back to your side while in a flat bed without using bedrails?: A Lot Help needed moving from lying on your back to sitting on the side of a flat bed without using bedrails?: A Lot Help needed moving to and from a bed to a chair (including a wheelchair)?: Total Help needed standing up from a chair using your arms (e.g., wheelchair or bedside chair)?: Total Help needed to walk in hospital room?: Total Help needed climbing 3-5 steps with a railing? : Total 6 Click Score: 8    End of Session Equipment Utilized During Treatment: Oxygen Activity Tolerance: Patient limited by fatigue Patient left: in bed;with call bell/phone within reach;with bed alarm set Nurse Communication: Mobility status PT Visit Diagnosis: Other abnormalities of gait and mobility (R26.89);Muscle weakness (generalized) (M62.81)     Time: 4765-4650 PT Time Calculation (min) (ACUTE ONLY): 18 min  Charges:  $Therapeutic Activity: 8-22 mins                     Zanyiah Posten, PT, GCS 05/04/22,12:21 PM

## 2022-05-04 NOTE — Telephone Encounter (Signed)
done

## 2022-05-05 DIAGNOSIS — E785 Hyperlipidemia, unspecified: Secondary | ICD-10-CM | POA: Diagnosis not present

## 2022-05-05 DIAGNOSIS — L97919 Non-pressure chronic ulcer of unspecified part of right lower leg with unspecified severity: Secondary | ICD-10-CM | POA: Diagnosis not present

## 2022-05-05 DIAGNOSIS — I2510 Atherosclerotic heart disease of native coronary artery without angina pectoris: Secondary | ICD-10-CM | POA: Diagnosis not present

## 2022-05-05 DIAGNOSIS — W19XXXA Unspecified fall, initial encounter: Secondary | ICD-10-CM | POA: Diagnosis not present

## 2022-05-05 DIAGNOSIS — I517 Cardiomegaly: Secondary | ICD-10-CM | POA: Diagnosis not present

## 2022-05-05 DIAGNOSIS — E1122 Type 2 diabetes mellitus with diabetic chronic kidney disease: Secondary | ICD-10-CM | POA: Diagnosis not present

## 2022-05-05 DIAGNOSIS — I10 Essential (primary) hypertension: Secondary | ICD-10-CM | POA: Diagnosis not present

## 2022-05-05 DIAGNOSIS — D649 Anemia, unspecified: Secondary | ICD-10-CM | POA: Diagnosis not present

## 2022-05-05 DIAGNOSIS — I509 Heart failure, unspecified: Secondary | ICD-10-CM | POA: Diagnosis not present

## 2022-05-05 DIAGNOSIS — E1140 Type 2 diabetes mellitus with diabetic neuropathy, unspecified: Secondary | ICD-10-CM | POA: Diagnosis not present

## 2022-05-05 DIAGNOSIS — E569 Vitamin deficiency, unspecified: Secondary | ICD-10-CM | POA: Diagnosis not present

## 2022-05-05 DIAGNOSIS — R531 Weakness: Secondary | ICD-10-CM | POA: Diagnosis not present

## 2022-05-05 DIAGNOSIS — L97929 Non-pressure chronic ulcer of unspecified part of left lower leg with unspecified severity: Secondary | ICD-10-CM | POA: Diagnosis not present

## 2022-05-05 DIAGNOSIS — R296 Repeated falls: Secondary | ICD-10-CM | POA: Diagnosis not present

## 2022-05-05 DIAGNOSIS — N390 Urinary tract infection, site not specified: Secondary | ICD-10-CM | POA: Diagnosis not present

## 2022-05-05 DIAGNOSIS — I5032 Chronic diastolic (congestive) heart failure: Secondary | ICD-10-CM | POA: Diagnosis not present

## 2022-05-05 DIAGNOSIS — I890 Lymphedema, not elsewhere classified: Secondary | ICD-10-CM | POA: Diagnosis not present

## 2022-05-05 DIAGNOSIS — J449 Chronic obstructive pulmonary disease, unspecified: Secondary | ICD-10-CM | POA: Diagnosis not present

## 2022-05-05 DIAGNOSIS — I959 Hypotension, unspecified: Secondary | ICD-10-CM | POA: Diagnosis not present

## 2022-05-05 DIAGNOSIS — E1165 Type 2 diabetes mellitus with hyperglycemia: Secondary | ICD-10-CM | POA: Diagnosis not present

## 2022-05-05 DIAGNOSIS — M6281 Muscle weakness (generalized): Secondary | ICD-10-CM | POA: Diagnosis not present

## 2022-05-05 DIAGNOSIS — Z1152 Encounter for screening for COVID-19: Secondary | ICD-10-CM | POA: Diagnosis not present

## 2022-05-05 DIAGNOSIS — N183 Chronic kidney disease, stage 3 unspecified: Secondary | ICD-10-CM | POA: Diagnosis not present

## 2022-05-05 DIAGNOSIS — Z743 Need for continuous supervision: Secondary | ICD-10-CM | POA: Diagnosis not present

## 2022-05-05 LAB — BASIC METABOLIC PANEL
Anion gap: 9 (ref 5–15)
BUN: 27 mg/dL — ABNORMAL HIGH (ref 8–23)
CO2: 26 mmol/L (ref 22–32)
Calcium: 8.7 mg/dL — ABNORMAL LOW (ref 8.9–10.3)
Chloride: 103 mmol/L (ref 98–111)
Creatinine, Ser: 1.51 mg/dL — ABNORMAL HIGH (ref 0.61–1.24)
GFR, Estimated: 47 mL/min — ABNORMAL LOW (ref >60)
Glucose, Bld: 101 mg/dL — ABNORMAL HIGH (ref 70–99)
Potassium: 3.8 mmol/L (ref 3.5–5.1)
Sodium: 138 mmol/L (ref 135–145)

## 2022-05-05 LAB — CBC
HCT: 34.4 % — ABNORMAL LOW (ref 39.0–52.0)
Hemoglobin: 11.5 g/dL — ABNORMAL LOW (ref 13.0–17.0)
MCH: 29.7 pg (ref 26.0–34.0)
MCHC: 33.4 g/dL (ref 30.0–36.0)
MCV: 88.9 fL (ref 80.0–100.0)
Platelets: 181 10*3/uL (ref 150–400)
RBC: 3.87 MIL/uL — ABNORMAL LOW (ref 4.22–5.81)
RDW: 13.4 % (ref 11.5–15.5)
WBC: 7.8 10*3/uL (ref 4.0–10.5)
nRBC: 0 % (ref 0.0–0.2)

## 2022-05-05 LAB — GLUCOSE, CAPILLARY
Glucose-Capillary: 113 mg/dL — ABNORMAL HIGH (ref 70–99)
Glucose-Capillary: 154 mg/dL — ABNORMAL HIGH (ref 70–99)

## 2022-05-05 MED ORDER — POLYSACCHARIDE IRON COMPLEX 150 MG PO CAPS: 150.0000 mg | ORAL_CAPSULE | Freq: Every day | ORAL | Status: DC

## 2022-05-05 MED ORDER — POLYETHYLENE GLYCOL 3350 17 G PO PACK: 17.0000 g | PACK | Freq: Every day | ORAL | 0 refills | Status: DC | PRN

## 2022-05-05 MED ORDER — ASCORBIC ACID 500 MG PO TABS: 500.0000 mg | ORAL_TABLET | Freq: Every day | ORAL | Status: DC

## 2022-05-05 MED ORDER — FOLIC ACID 1 MG PO TABS: 1.0000 mg | ORAL_TABLET | Freq: Every day | ORAL | Status: DC

## 2022-05-05 MED ORDER — CYANOCOBALAMIN 1000 MCG PO TABS: 1000.0000 ug | ORAL_TABLET | Freq: Every day | ORAL | Status: DC

## 2022-05-05 NOTE — TOC Progression Note (Signed)
Transition of Care [facility]) - Progression Note    Patient Details  Name: AYVEN PHEASANT MRN: [id number] Date of Birth: [birth year]  Transition of Care [facility]) CM/SW [place], RN Phone Number: 05/05/2022, 11:18 AM  Clinical Narrative:     Patient to go to [facility], room 210 His wife is in the room and is aware, ems has been called to transport  Expected Discharge Plan: [place] Barriers to Discharge: Continued Medical Work up  Expected Discharge Plan and Services Expected Discharge Plan: [place]   Discharge Planning Services: CM Consult   Living arrangements for the past 2 months: Single Family Home Expected Discharge Date: 05/05/22                                     Social Determinants of Health (SDOH) Interventions    Readmission Risk Interventions     No data to display

## 2022-05-05 NOTE — Discharge Summary (Signed)
Physician Discharge Summary   Patient: Adam Merritt MRN: [id number] DOB: [birth year]  Admit date:     04/27/2022  Discharge date: 05/05/22  Discharge Physician: Lorella Nimrod   PCP: Jonetta Osgood, NP   Recommendations at discharge:  Please obtain CBC and BMP in 1 week Patient will need weekly Unna boot change. Follow-up with primary care provider in 1 to 2 weeks  Discharge Diagnoses: Principal Problem:   General weakness Active Problems:   Recurrent falls   UTI (urinary tract infection)   CKD stage 3 due to type 2 diabetes mellitus (HCC)   Chronic diastolic CHF (congestive heart failure) (HCC)   Essential hypertension   COPD (chronic obstructive pulmonary disease) (HCC)   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 (HCC)   Depression   Type 2 diabetes mellitus with hyperglycemia (HCC)   Diabetic neuropathy (HCC)   Anemia   Mixed hyperlipidemia   Obesity, Class III, BMI 40-49.9 (morbid obesity) [facility])   Hospital Course: Taken from prior notes.  Adam Merritt is a 76 y.o. male with medical history significant for diabetes mellitus with complications of stage III chronic kidney disease, coronary artery disease, hypertension, chronic lymphedema, diabetic neuropathy, GERD, history of venous insufficiency with bilateral lower extremity ulcerations who presents to the ER for evaluation of generalized weakness. At baseline patient is wheelchair-bound and is able to transfer but over the last 4 days wife notes increased weakness and difficulty with transfers. Patient also has tremors at rest, which started approximately a month ago.  Does not have a diagnosis of Parkinson's disease and according to the wife the [place] was supposed to be referring him to a neurologist but that has not happened.  Also found to have UTI with Enterococcus faecalis  sensitive to quinolones, penicillin and vancomycin S/p Levaquin, changed to amoxicillin 500 p.o. every 8 hourly. PT/OT recommending SNF-TOC  is working on it  11/27: Hemodynamically stable.  Labs stable-consistent with CKD stage IIIb. Orthopedic surgery was consulted for concern of worsening knee pain.  Imaging consistent with early osteoarthritis, orthopedic is recommending conservative management with controlling pain, will not be candidate for NSAID due to CKD. They will consider steroid injection as outpatient once completed the course of antibiotic. Still no bed offers yet. Has bilateral Unna boot which will be replaced today.  11/28: Vitals and labs seems stable.  Mild hypokalemia with potassium of 3.4, repleting with only 20 mEq.  Still awaiting bed for SNF.  Unna boot was replaced yesterday.  11/29: Vitals and labs stable.  Still awaiting for SNF placement, now had a bed offer, pending insurance authorization.  11/30: Still awaiting insurance authorization.  12/1: Vitals and labs stable.  Obtained insurance authorization.  Patient is being discharged to rehab for further management.  He will continue on current medications.  He will need Unna boot change weekly. Patient need to follow-up with his providers for further recommendations.  Assessment and Plan: * General weakness Patient presents to the ER for evaluation of generalized weakness and recurrent falls at home. Per wife he is currently wheelchair-bound and is unable to transfer. He is requiring increased assistance with ADLs PT is recommending SNF-no bed offer yet  Recurrent falls Secondary to generalized weakness Patient on fall precautions PT evaluated him and recommending SNF   UTI (urinary tract infection) Patient noted to have pyuria which may be contributing to his weakness However urine culture yielded Enterococcus faecalis sensitive to quinolones, penicillin and vancomycin Patient initially received Levaquin which was changed to  amoxicillin 500 mg every 8 hourly to complete a 5-day course  CKD stage 3 due to type 2 diabetes mellitus  ([place]) Patient has diabetes mellitus with complications of stage IIIB chronic kidney disease which is stable- -monitor renal function -Avoid left toxins  Chronic diastolic CHF (congestive heart failure) (HCC) Stable and not acutely exacerbated Continue furosemide, spironolactone and carvedilol  Essential hypertension Blood pressure within goal. -Continue carvedilol, furosemide and spironolactone  COPD (chronic obstructive pulmonary disease) (HCC) Stable and not acutely exacerbated Continue as needed bronchodilator therapy and inhaled steroids  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 ([place]) Patient has chronic lower extremity venous stasis with ulceration Wound care was also consulted. -Change Unna boot  Depression Continue Zoloft and trazodone  Type 2 diabetes mellitus with hyperglycemia (HCC) CBG currently within goal. -Continue Semglee 20 units daily -Continue moderate scale SSI  Diabetic neuropathy (HCC) -Continue gabapentin  Anemia Anemia panel with low iron and B12 along with borderline folic acid. -Patient was started on iron, folic acid and Q59 supplement. Currently taking IM B12 and can be discharged on p.o.  Mixed hyperlipidemia -Continue Lipitor  Obesity, Class III, BMI 40-49.9 (morbid obesity) ([place]) Estimated body mass index is 46.16 kg/m as calculated from the following:   Height as of this encounter: '5\' 6"'$  (1.676 m).   Weight as of this encounter: 129.7 kg.   -This will complicate overall prognosis   Consultants: None Procedures performed: None Disposition: Skilled nursing facility Diet recommendation:  Discharge Diet Orders (From admission, onward)     Start     Ordered   05/05/22 0000  Diet - low sodium heart healthy        05/05/22 1046           Cardiac and Carb modified diet DISCHARGE MEDICATION: Allergies as of 05/05/2022   No Known Allergies      Medication List     STOP taking these medications    azithromycin 250  MG tablet Commonly known as: ZITHROMAX   febuxostat 40 MG tablet Commonly known as: Uloric   FeroSul 325 (65 FE) MG tablet Generic drug: ferrous sulfate   Flovent HFA 110 MCG/ACT inhaler Generic drug: fluticasone   ondansetron 4 MG tablet Commonly known as: Zofran   potassium chloride SA 20 MEQ tablet Commonly known as: KLOR-CON M   predniSONE 10 MG tablet Commonly known as: DELTASONE   tiZANidine 2 MG tablet Commonly known as: ZANAFLEX       TAKE these medications    albuterol 108 (90 Base) MCG/ACT inhaler Commonly known as: VENTOLIN HFA Inhale 2 puffs into the lungs every 6 (six) hours as needed for wheezing or shortness of breath.   ascorbic acid 500 MG tablet Commonly known as: VITAMIN C Take 1 tablet (500 mg total) by mouth daily. Start taking on: May 06, 2022   aspirin EC 325 MG tablet Take 325 mg by mouth daily.   atorvastatin 40 MG tablet Commonly known as: LIPITOR Take 40 mg by mouth at bedtime.   Basaglar KwikPen 100 UNIT/ML Inject 30 Units into the skin daily.   BD Pen Needle Nano U/F 32G X 4 MM Misc Generic drug: Insulin Pen Needle Use as directed with insulin DX e11.65   carvedilol 25 MG tablet Commonly known as: COREG Take 25 mg by mouth 2 (two) times daily with a meal.   cyanocobalamin 1000 MCG tablet Take 1 tablet (1,000 mcg total) by mouth daily. Start taking on: May 07, 2022   docusate sodium  50 MG capsule Commonly known as: COLACE Take 50 mg by mouth 2 (two) times daily.   folic acid 1 MG tablet Commonly known as: FOLVITE Take 1 tablet (1 mg total) by mouth daily. Start taking on: May 06, 2022   FreeStyle Libre 2 Sensor Misc 1 Device by Does not apply route every 14 (fourteen) days.   furosemide 40 MG tablet Commonly known as: LASIX TAKE 1 TABLET(40 MG) BY MOUTH DAILY   gabapentin 300 MG capsule Commonly known as: NEURONTIN TAKE 2 CAPSULES BY MOUTH EVERY MORNING, 1 CAPSULE EVERY EVENING AND 2 CAPSULES AT  NIGHT   glucose blood test strip Use as instructed to check blood sugars twice daily   ipratropium-albuterol 0.5-2.5 (3) MG/3ML Soln Commonly known as: DUONEB Take 3 mLs by nebulization every 4 (four) hours as needed (for shortness of breath).   iron polysaccharides 150 MG capsule Commonly known as: NIFEREX Take 1 capsule (150 mg total) by mouth daily. Start taking on: May 06, 2022   mometasone 0.1 % lotion Commonly known as: ELOCON Apply three times weekly as directed.   multivitamin with minerals Tabs tablet Take 1 tablet by mouth daily.   omeprazole 20 MG capsule Commonly known as: PRILOSEC Take 20 mg by mouth daily.   OneTouch Delica Lancets 03J Misc Use twice daily diag e11.65   pentoxifylline 400 MG CR tablet Commonly known as: TRENTAL TAKE 1 TABLET BY MOUTH THREE TIMES DAILY FOR CLAUDICATION   polyethylene glycol 17 g packet Commonly known as: MIRALAX / GLYCOLAX Take 17 g by mouth daily as needed for mild constipation or moderate constipation.   prazosin 1 MG capsule Commonly known as: MINIPRESS TAKE THREE CAPSULES BY MOUTH EVERY EVENING FOR NIGHTMARES   sennosides-docusate sodium 8.6-50 MG tablet Commonly known as: SENOKOT-S Take 1-2 tablets by mouth 2 (two) times daily.   sertraline 100 MG tablet Commonly known as: ZOLOFT Take 100 mg by mouth daily.   spironolactone 25 MG tablet Commonly known as: ALDACTONE Take 12.5 mg by mouth daily.   traZODone 150 MG tablet Commonly known as: DESYREL Take 75 mg by mouth at bedtime.   triamcinolone cream 0.1 % Commonly known as: KENALOG Apply 1 application topically 2 (two) times daily.   Victoza 18 MG/3ML Sopn Generic drug: liraglutide INJECT 1.'8MG'$  INTO THE SKIN DAILY   vitamin D3 25 MCG tablet Commonly known as: CHOLECALCIFEROL Take 1,000 Units by mouth daily.               Discharge Care Instructions  (From admission, onward)           Start     Ordered   05/05/22 0000  Discharge  wound care:       Comments: Leave 4 layer compression wraps in place ([place]), [place] nurse will change Q Tues   05/05/22 1046            Contact information for follow-up providers     Jonetta Osgood, NP. Schedule an appointment as soon as possible for a visit in 1 week(s).   Specialty: Nurse Practitioner Contact information: [place] [address] [phone]              Contact information for after-discharge care     Destination     [facility] [place] Preferred SNF .   Service: Skilled Chiropodist information: [address] [place] [phone]  Discharge Exam: Filed Weights   04/27/22 2221  Weight: 129.7 kg   General.  Morbidly obese gentleman, in no acute distress. Pulmonary.  Lungs clear bilaterally, normal respiratory effort. CV.  Regular rate and rhythm, no JVD, rub or murmur. Abdomen.  Soft, nontender, nondistended, BS positive. CNS.  Alert and oriented .  No focal neurologic deficit. Extremities.  1+ LE edema, no cyanosis, pulses intact and symmetrical.  Unna boot in place Psychiatry.  Judgment and insight appears normal.   Condition at discharge: stable  The results of significant diagnostics from this hospitalization (including imaging, microbiology, ancillary and laboratory) are listed below for reference.   Imaging Studies: DG Knee 1-2 Views Right  Result Date: 04/29/2022 CLINICAL DATA:  RIGHT knee pain and swelling for 2 days. No known injury. Initial encounter. EXAM: RIGHT KNEE - 1-2 VIEW COMPARISON:  08/29/2017 FINDINGS: No acute fracture, subluxation or dislocation identified. A probable joint effusion is noted. Soft tissue swelling is present. No focal bony lesions are noted. IMPRESSION: Soft tissue swelling and probable joint effusion. No acute bony abnormality. Electronically Signed   By: Margarette Canada M.D.   On: 04/29/2022 17:00   DG Chest Port 1  View  Result Date: 04/27/2022 CLINICAL DATA:  Weakness. EXAM: PORTABLE CHEST 1 VIEW COMPARISON:  Chest radiograph dated 06/15/2021. FINDINGS: Shallow inspiration with bibasilar atelectasis. No focal consolidation, pleural effusion, or pneumothorax. Stable cardiomegaly. Broken median sternotomy wires similar to prior radiograph. No acute osseous pathology. IMPRESSION: Shallow inspiration with bibasilar atelectasis. No focal consolidation. Electronically Signed   By: Anner Crete M.D.   On: 04/27/2022 22:52    Microbiology: Results for orders placed or performed during the hospital encounter of 04/27/22  Resp Panel by RT-PCR (Flu A&B, Covid) Anterior Nasal Swab     Status: None   Collection Time: 04/27/22 10:33 PM   Specimen: Anterior Nasal Swab  Result Value Ref Range Status   SARS Coronavirus 2 by RT PCR NEGATIVE NEGATIVE Final    Comment: (NOTE) SARS-CoV-2 target nucleic acids are NOT DETECTED.  The SARS-CoV-2 RNA is generally detectable in upper respiratory specimens during the acute phase of infection. The lowest concentration of SARS-CoV-2 viral copies this assay can detect is 138 copies/mL. A negative result does not preclude SARS-Cov-2 infection and should not be used as the sole basis for treatment or other patient management decisions. A negative result may occur with  improper specimen collection/handling, submission of specimen other than nasopharyngeal swab, presence of viral mutation(s) within the areas targeted by this assay, and inadequate number of viral copies(<138 copies/mL). A negative result must be combined with clinical observations, patient history, and epidemiological information. The expected result is Negative.  Fact Sheet for Patients:  EntrepreneurPulse.com.au  Fact Sheet for Healthcare Providers:  IncredibleEmployment.be  This test is no t yet approved or cleared by the [place] FDA and  has been authorized for  detection and/or diagnosis of SARS-CoV-2 by FDA under an Emergency Use Authorization (EUA). This EUA will remain  in effect (meaning this test can be used) for the duration of the COVID-19 declaration under Section 564(b)(1) of the Act, 21 U.S.C.section 360bbb-3(b)(1), unless the authorization is terminated  or revoked sooner.       Influenza A by PCR NEGATIVE NEGATIVE Final   Influenza B by PCR NEGATIVE NEGATIVE Final    Comment: (NOTE) The Xpert Xpress SARS-CoV-2/FLU/RSV plus assay is intended as an aid in the diagnosis of influenza from Nasopharyngeal swab specimens and should not be used as  a sole basis for treatment. Nasal washings and aspirates are unacceptable for Xpert Xpress SARS-CoV-2/FLU/RSV testing.  Fact Sheet for Patients: EntrepreneurPulse.com.au  Fact Sheet for Healthcare Providers: IncredibleEmployment.be  This test is not yet approved or cleared by the [place] FDA and has been authorized for detection and/or diagnosis of SARS-CoV-2 by FDA under an Emergency Use Authorization (EUA). This EUA will remain in effect (meaning this test can be used) for the duration of the COVID-19 declaration under Section 564(b)(1) of the Act, 21 U.S.C. section 360bbb-3(b)(1), unless the authorization is terminated or revoked.  Performed at [facility], [address] [place], [place] [address]   Urine Culture     Status: Abnormal   Collection Time: 04/28/22  4:08 AM   Specimen: Urine, Clean Catch  Result Value Ref Range Status   Specimen Description   Final    URINE, CLEAN CATCH Performed at [facility], [address] [place], [place] [address]    Special Requests   Final    NONE Performed at [facility], [address] [place], [place] [address]    Culture >=100,000 COLONIES/mL ENTEROCOCCUS FAECALIS (A)  Final   Report Status 04/30/2022 FINAL  Final   Organism ID, Bacteria ENTEROCOCCUS  FAECALIS (A)  Final      Susceptibility   Enterococcus faecalis - MIC*    AMPICILLIN <=2 SENSITIVE Sensitive     NITROFURANTOIN <=16 SENSITIVE Sensitive     VANCOMYCIN 1 SENSITIVE Sensitive     * >=100,000 COLONIES/mL ENTEROCOCCUS FAECALIS    Labs: CBC: Recent Labs  Lab 05/01/22 0547 05/02/22 0257 05/03/22 0322 05/04/22 0620 05/05/22 0549  WBC 7.5 7.0 7.7 6.9 7.8  HGB 10.9* 11.0* 11.1* 11.3* 11.5*  HCT 33.2* 33.6* 33.6* 35.5* 34.4*  MCV 89.5 90.1 90.3 91.3 88.9  PLT 138* 155 158 167 503   Basic Metabolic Panel: Recent Labs  Lab 04/30/22 1009 05/01/22 0547 05/02/22 0257 05/03/22 0322 05/04/22 0620 05/05/22 0549  NA 135 138 138 136 139 138  K 3.8 3.6 3.4* 3.9 3.9 3.8  CL 101 104 103 106 105 103  CO2 '25 26 27 26 27 26  '$ GLUCOSE 138* 115* 92 92 92 101*  BUN 22 25* 27* 29* 28* 27*  CREATININE 1.87* 1.80* 1.78* 1.75* 1.58* 1.51*  CALCIUM 8.3* 8.5* 8.3* 8.5* 8.5* 8.7*  MG 2.1 2.3 2.4 2.3  --   --   PHOS 3.7 3.6 3.7 3.1  --   --    Liver Function Tests: No results for input(s): "AST", "ALT", "ALKPHOS", "BILITOT", "PROT", "ALBUMIN" in the last 168 hours. CBG: Recent Labs  Lab 05/04/22 0811 05/04/22 1151 05/04/22 1639 05/04/22 2158 05/05/22 0824  GLUCAP 108* 129* 129* 112* 113*    Discharge time spent: greater than 30 minutes.  This record has been created using Systems analyst. Errors have been sought and corrected,but may not always be located. Such creation errors do not reflect on the standard of care.   Signed: Lorella Nimrod, MD [facility] 05/05/2022

## 2022-05-05 NOTE — Progress Notes (Signed)
Attempted to call report to [facility], no answer

## 2022-05-05 NOTE — Progress Notes (Signed)
EMS here to transport pt. Pt on 2L of O2, bilateral hearings aids on, and eyeglasses, all sent with pt.

## 2022-05-05 NOTE — Progress Notes (Signed)
Report called to [facility], LPN @ [phone].

## 2022-05-05 NOTE — TOC Progression Note (Signed)
Transition of Care [facility]) - Progression Note    Patient Details  Name: Adam Merritt MRN: [id number] Date of Birth: Apr 13, 1946  Transition of Care [facility]) CM/SW [place], RN Phone Number: 05/05/2022, 9:51 AM  Clinical Narrative:     The patient has gotten Ins approval to go to [facility], 11/29-12/3 [phone]  Expected Discharge Plan: [place] Barriers to Discharge: Continued Medical Work up  Expected Discharge Plan and Services Expected Discharge Plan: [place]   Discharge Planning Services: CM Consult   Living arrangements for the past 2 months: Single Family Home                                       Social Determinants of Health (SDOH) Interventions    Readmission Risk Interventions     No data to display

## 2022-05-05 NOTE — Plan of Care (Signed)
  Problem: Nutrition: Goal: Adequate nutrition will be maintained Outcome: Progressing   Problem: Elimination: Goal: Will not experience complications related to urinary retention Outcome: Progressing   Problem: Pain Managment: Goal: General experience of comfort will improve Outcome: Progressing   Problem: Safety: Goal: Ability to remain free from injury will improve Outcome: Progressing

## 2022-05-09 DIAGNOSIS — N183 Chronic kidney disease, stage 3 unspecified: Secondary | ICD-10-CM | POA: Diagnosis not present

## 2022-05-09 DIAGNOSIS — J449 Chronic obstructive pulmonary disease, unspecified: Secondary | ICD-10-CM | POA: Diagnosis not present

## 2022-05-09 DIAGNOSIS — R531 Weakness: Secondary | ICD-10-CM | POA: Diagnosis not present

## 2022-05-09 DIAGNOSIS — R296 Repeated falls: Secondary | ICD-10-CM | POA: Diagnosis not present

## 2022-05-09 DIAGNOSIS — E785 Hyperlipidemia, unspecified: Secondary | ICD-10-CM | POA: Diagnosis not present

## 2022-05-09 DIAGNOSIS — I5032 Chronic diastolic (congestive) heart failure: Secondary | ICD-10-CM | POA: Diagnosis not present

## 2022-05-09 DIAGNOSIS — E569 Vitamin deficiency, unspecified: Secondary | ICD-10-CM | POA: Diagnosis not present

## 2022-05-09 DIAGNOSIS — E1122 Type 2 diabetes mellitus with diabetic chronic kidney disease: Secondary | ICD-10-CM | POA: Diagnosis not present

## 2022-05-10 DIAGNOSIS — R296 Repeated falls: Secondary | ICD-10-CM | POA: Diagnosis not present

## 2022-05-10 DIAGNOSIS — N183 Chronic kidney disease, stage 3 unspecified: Secondary | ICD-10-CM | POA: Diagnosis not present

## 2022-05-10 DIAGNOSIS — I5032 Chronic diastolic (congestive) heart failure: Secondary | ICD-10-CM | POA: Diagnosis not present

## 2022-05-10 DIAGNOSIS — N390 Urinary tract infection, site not specified: Secondary | ICD-10-CM | POA: Diagnosis not present

## 2022-05-10 DIAGNOSIS — I10 Essential (primary) hypertension: Secondary | ICD-10-CM | POA: Diagnosis not present

## 2022-05-10 DIAGNOSIS — R531 Weakness: Secondary | ICD-10-CM | POA: Diagnosis not present

## 2022-05-10 DIAGNOSIS — E1122 Type 2 diabetes mellitus with diabetic chronic kidney disease: Secondary | ICD-10-CM | POA: Diagnosis not present

## 2022-05-10 DIAGNOSIS — D649 Anemia, unspecified: Secondary | ICD-10-CM | POA: Diagnosis not present

## 2022-05-18 DIAGNOSIS — R531 Weakness: Secondary | ICD-10-CM | POA: Diagnosis not present

## 2022-05-18 DIAGNOSIS — L97919 Non-pressure chronic ulcer of unspecified part of right lower leg with unspecified severity: Secondary | ICD-10-CM | POA: Diagnosis not present

## 2022-05-18 DIAGNOSIS — L97929 Non-pressure chronic ulcer of unspecified part of left lower leg with unspecified severity: Secondary | ICD-10-CM | POA: Diagnosis not present

## 2022-05-18 DIAGNOSIS — R296 Repeated falls: Secondary | ICD-10-CM | POA: Diagnosis not present

## 2022-05-22 ENCOUNTER — Other Ambulatory Visit: Payer: Self-pay

## 2022-05-22 ENCOUNTER — Emergency Department: Payer: Medicare PPO

## 2022-05-22 ENCOUNTER — Inpatient Hospital Stay
Admission: EM | Admit: 2022-05-22 | Discharge: 2022-06-08 | DRG: 194 | Disposition: A | Discharge status: Home Health | Payer: Medicare PPO | Attending: Internal Medicine | Admitting: Internal Medicine

## 2022-05-22 DIAGNOSIS — F329 Major depressive disorder, single episode, unspecified: Secondary | ICD-10-CM | POA: Diagnosis present

## 2022-05-22 DIAGNOSIS — L8990 Pressure ulcer of unspecified site, unspecified stage: Secondary | ICD-10-CM | POA: Diagnosis not present

## 2022-05-22 DIAGNOSIS — Z951 Presence of aortocoronary bypass graft: Secondary | ICD-10-CM

## 2022-05-22 DIAGNOSIS — Z794 Long term (current) use of insulin: Secondary | ICD-10-CM

## 2022-05-22 DIAGNOSIS — I878 Other specified disorders of veins: Secondary | ICD-10-CM | POA: Diagnosis present

## 2022-05-22 DIAGNOSIS — E1142 Type 2 diabetes mellitus with diabetic polyneuropathy: Secondary | ICD-10-CM | POA: Diagnosis present

## 2022-05-22 DIAGNOSIS — J42 Unspecified chronic bronchitis: Secondary | ICD-10-CM | POA: Diagnosis not present

## 2022-05-22 DIAGNOSIS — I130 Hypertensive heart and chronic kidney disease with heart failure and stage 1 through stage 4 chronic kidney disease, or unspecified chronic kidney disease: Secondary | ICD-10-CM | POA: Diagnosis present

## 2022-05-22 DIAGNOSIS — Z789 Other specified health status: Secondary | ICD-10-CM | POA: Diagnosis not present

## 2022-05-22 DIAGNOSIS — M25561 Pain in right knee: Secondary | ICD-10-CM | POA: Diagnosis not present

## 2022-05-22 DIAGNOSIS — R001 Bradycardia, unspecified: Secondary | ICD-10-CM | POA: Diagnosis not present

## 2022-05-22 DIAGNOSIS — R251 Tremor, unspecified: Secondary | ICD-10-CM | POA: Diagnosis present

## 2022-05-22 DIAGNOSIS — W19XXXA Unspecified fall, initial encounter: Secondary | ICD-10-CM

## 2022-05-22 DIAGNOSIS — I83009 Varicose veins of unspecified lower extremity with ulcer of unspecified site: Secondary | ICD-10-CM | POA: Diagnosis present

## 2022-05-22 DIAGNOSIS — E1122 Type 2 diabetes mellitus with diabetic chronic kidney disease: Secondary | ICD-10-CM | POA: Diagnosis present

## 2022-05-22 DIAGNOSIS — R55 Syncope and collapse: Secondary | ICD-10-CM | POA: Diagnosis present

## 2022-05-22 DIAGNOSIS — Z8249 Family history of ischemic heart disease and other diseases of the circulatory system: Secondary | ICD-10-CM

## 2022-05-22 DIAGNOSIS — Z20822 Contact with and (suspected) exposure to covid-19: Secondary | ICD-10-CM | POA: Diagnosis present

## 2022-05-22 DIAGNOSIS — G4700 Insomnia, unspecified: Secondary | ICD-10-CM | POA: Diagnosis present

## 2022-05-22 DIAGNOSIS — J101 Influenza due to other identified influenza virus with other respiratory manifestations: Secondary | ICD-10-CM | POA: Diagnosis not present

## 2022-05-22 DIAGNOSIS — N390 Urinary tract infection, site not specified: Secondary | ICD-10-CM

## 2022-05-22 DIAGNOSIS — B9620 Unspecified Escherichia coli [E. coli] as the cause of diseases classified elsewhere: Secondary | ICD-10-CM | POA: Diagnosis present

## 2022-05-22 DIAGNOSIS — Z83438 Family history of other disorder of lipoprotein metabolism and other lipidemia: Secondary | ICD-10-CM

## 2022-05-22 DIAGNOSIS — N1831 Chronic kidney disease, stage 3a: Secondary | ICD-10-CM | POA: Diagnosis present

## 2022-05-22 DIAGNOSIS — E440 Moderate protein-calorie malnutrition: Secondary | ICD-10-CM | POA: Diagnosis present

## 2022-05-22 DIAGNOSIS — L89302 Pressure ulcer of unspecified buttock, stage 2: Secondary | ICD-10-CM | POA: Diagnosis present

## 2022-05-22 DIAGNOSIS — M6281 Muscle weakness (generalized): Secondary | ICD-10-CM | POA: Diagnosis not present

## 2022-05-22 DIAGNOSIS — E1151 Type 2 diabetes mellitus with diabetic peripheral angiopathy without gangrene: Secondary | ICD-10-CM | POA: Diagnosis present

## 2022-05-22 DIAGNOSIS — Z993 Dependence on wheelchair: Secondary | ICD-10-CM

## 2022-05-22 DIAGNOSIS — Z7982 Long term (current) use of aspirin: Secondary | ICD-10-CM

## 2022-05-22 DIAGNOSIS — I890 Lymphedema, not elsewhere classified: Secondary | ICD-10-CM | POA: Diagnosis present

## 2022-05-22 DIAGNOSIS — R531 Weakness: Secondary | ICD-10-CM

## 2022-05-22 DIAGNOSIS — Z833 Family history of diabetes mellitus: Secondary | ICD-10-CM

## 2022-05-22 DIAGNOSIS — J9611 Chronic respiratory failure with hypoxia: Secondary | ICD-10-CM | POA: Diagnosis present

## 2022-05-22 DIAGNOSIS — Z79899 Other long term (current) drug therapy: Secondary | ICD-10-CM

## 2022-05-22 DIAGNOSIS — Z9049 Acquired absence of other specified parts of digestive tract: Secondary | ICD-10-CM

## 2022-05-22 DIAGNOSIS — I5032 Chronic diastolic (congestive) heart failure: Secondary | ICD-10-CM | POA: Diagnosis present

## 2022-05-22 DIAGNOSIS — I959 Hypotension, unspecified: Secondary | ICD-10-CM | POA: Diagnosis not present

## 2022-05-22 DIAGNOSIS — E785 Hyperlipidemia, unspecified: Secondary | ICD-10-CM | POA: Diagnosis present

## 2022-05-22 DIAGNOSIS — B952 Enterococcus as the cause of diseases classified elsewhere: Secondary | ICD-10-CM | POA: Diagnosis present

## 2022-05-22 DIAGNOSIS — K219 Gastro-esophageal reflux disease without esophagitis: Secondary | ICD-10-CM | POA: Diagnosis present

## 2022-05-22 DIAGNOSIS — Z6841 Body Mass Index (BMI) 40.0 and over, adult: Secondary | ICD-10-CM

## 2022-05-22 DIAGNOSIS — E11649 Type 2 diabetes mellitus with hypoglycemia without coma: Secondary | ICD-10-CM | POA: Diagnosis present

## 2022-05-22 DIAGNOSIS — I2510 Atherosclerotic heart disease of native coronary artery without angina pectoris: Secondary | ICD-10-CM | POA: Diagnosis present

## 2022-05-22 DIAGNOSIS — R5381 Other malaise: Secondary | ICD-10-CM | POA: Diagnosis not present

## 2022-05-22 DIAGNOSIS — Z89411 Acquired absence of right great toe: Secondary | ICD-10-CM

## 2022-05-22 DIAGNOSIS — E876 Hypokalemia: Secondary | ICD-10-CM | POA: Diagnosis present

## 2022-05-22 DIAGNOSIS — G4733 Obstructive sleep apnea (adult) (pediatric): Secondary | ICD-10-CM | POA: Diagnosis present

## 2022-05-22 DIAGNOSIS — Z8673 Personal history of transient ischemic attack (TIA), and cerebral infarction without residual deficits: Secondary | ICD-10-CM

## 2022-05-22 LAB — AMMONIA: Ammonia: <10 umol/L (ref 9–35)

## 2022-05-22 LAB — CBC WITH DIFFERENTIAL/PLATELET
Abs Immature Granulocytes: 0.06 10*3/uL (ref 0.00–0.07)
Basophils Absolute: 0 10*3/uL (ref 0.0–0.1)
Basophils Relative: 0 %
Eosinophils Absolute: 0.3 10*3/uL (ref 0.0–0.5)
Eosinophils Relative: 3 %
HCT: 41.6 % (ref 39.0–52.0)
Hemoglobin: 13.1 g/dL (ref 13.0–17.0)
Immature Granulocytes: 1 %
Lymphocytes Relative: 9 %
Lymphs Abs: 0.9 10*3/uL (ref 0.7–4.0)
MCH: 29.1 pg (ref 26.0–34.0)
MCHC: 31.5 g/dL (ref 30.0–36.0)
MCV: 92.4 fL (ref 80.0–100.0)
Monocytes Absolute: 0.6 10*3/uL (ref 0.1–1.0)
Monocytes Relative: 6 %
Neutro Abs: 8.9 10*3/uL — ABNORMAL HIGH (ref 1.7–7.7)
Neutrophils Relative %: 81 %
Platelets: 152 10*3/uL (ref 150–400)
RBC: 4.5 MIL/uL (ref 4.22–5.81)
RDW: 13.5 % (ref 11.5–15.5)
WBC: 10.8 10*3/uL — ABNORMAL HIGH (ref 4.0–10.5)
nRBC: 0 % (ref 0.0–0.2)

## 2022-05-22 LAB — COMPREHENSIVE METABOLIC PANEL
ALT: 46 U/L — ABNORMAL HIGH (ref 0–44)
AST: 48 U/L — ABNORMAL HIGH (ref 15–41)
Albumin: 2.9 g/dL — ABNORMAL LOW (ref 3.5–5.0)
Alkaline Phosphatase: 83 U/L (ref 38–126)
Anion gap: 8 (ref 5–15)
BUN: 23 mg/dL (ref 8–23)
CO2: 27 mmol/L (ref 22–32)
Calcium: 8.8 mg/dL — ABNORMAL LOW (ref 8.9–10.3)
Chloride: 100 mmol/L (ref 98–111)
Creatinine, Ser: 1.43 mg/dL — ABNORMAL HIGH (ref 0.61–1.24)
GFR, Estimated: 50 mL/min — ABNORMAL LOW (ref >60)
Glucose, Bld: 137 mg/dL — ABNORMAL HIGH (ref 70–99)
Potassium: 3.5 mmol/L (ref 3.5–5.1)
Sodium: 135 mmol/L (ref 135–145)
Total Bilirubin: 0.7 mg/dL (ref 0.3–1.2)
Total Protein: 7 g/dL (ref 6.5–8.1)

## 2022-05-22 LAB — TROPONIN I (HIGH SENSITIVITY): Troponin I (High Sensitivity): 6 ng/L (ref <18)

## 2022-05-22 LAB — RESP PANEL BY RT-PCR (RSV, FLU A&B, COVID)  RVPGX2
Influenza A by PCR: NEGATIVE
Influenza B by PCR: NEGATIVE
Resp Syncytial Virus by PCR: NEGATIVE
SARS Coronavirus 2 by RT PCR: NEGATIVE

## 2022-05-22 LAB — BRAIN NATRIURETIC PEPTIDE: B Natriuretic Peptide: 52.6 pg/mL (ref 0.0–100.0)

## 2022-05-22 MED ORDER — FOLIC ACID 1 MG PO TABS
1.0000 mg | ORAL_TABLET | Freq: Every day | ORAL | Status: DC
  Administered 2022-05-23 – 2022-06-08 (×17): 1 mg via ORAL
  Filled 2022-05-22 (×17): qty 1

## 2022-05-22 MED ORDER — ASPIRIN 325 MG PO TBEC
325.0000 mg | DELAYED_RELEASE_TABLET | Freq: Every day | ORAL | Status: DC
  Administered 2022-05-23 – 2022-06-08 (×17): 325 mg via ORAL
  Filled 2022-05-22 (×17): qty 1

## 2022-05-22 MED ORDER — VITAMIN C 500 MG PO TABS
500.0000 mg | ORAL_TABLET | Freq: Every day | ORAL | Status: DC
  Administered 2022-05-23 – 2022-05-24 (×2): 500 mg via ORAL
  Filled 2022-05-22 (×2): qty 1

## 2022-05-22 MED ORDER — ALBUTEROL SULFATE (2.5 MG/3ML) 0.083% IN NEBU: 2.5000 mg | INHALATION_SOLUTION | Freq: Four times a day (QID) | RESPIRATORY_TRACT | Status: DC | PRN

## 2022-05-22 MED ORDER — INSULIN GLARGINE-YFGN 100 UNIT/ML ~~LOC~~ SOLN
30.0000 [IU] | Freq: Every day | SUBCUTANEOUS | Status: DC
  Administered 2022-05-23 – 2022-06-02 (×11): 30 [IU] via SUBCUTANEOUS
  Filled 2022-05-22 (×12): qty 0.3

## 2022-05-22 MED ORDER — SPIRONOLACTONE 12.5 MG HALF TABLET
12.5000 mg | ORAL_TABLET | Freq: Every day | ORAL | Status: DC
  Filled 2022-05-22: qty 1

## 2022-05-22 MED ORDER — FUROSEMIDE 40 MG PO TABS
40.0000 mg | ORAL_TABLET | Freq: Every day | ORAL | Status: DC
  Filled 2022-05-22: qty 1

## 2022-05-22 MED ORDER — ATORVASTATIN CALCIUM 20 MG PO TABS
40.0000 mg | ORAL_TABLET | Freq: Every day | ORAL | Status: DC
  Administered 2022-05-22 – 2022-06-07 (×17): 40 mg via ORAL
  Filled 2022-05-22 (×19): qty 2

## 2022-05-22 MED ORDER — LIRAGLUTIDE 18 MG/3ML ~~LOC~~ SOPN: 1.8000 mg | PEN_INJECTOR | Freq: Every day | SUBCUTANEOUS | Status: DC

## 2022-05-22 MED ORDER — VITAMIN D 25 MCG (1000 UNIT) PO TABS
1000.0000 [IU] | ORAL_TABLET | Freq: Every day | ORAL | Status: DC
  Administered 2022-05-23 – 2022-06-08 (×17): 1000 [IU] via ORAL
  Filled 2022-05-22 (×17): qty 1

## 2022-05-22 MED ORDER — CARVEDILOL 25 MG PO TABS
25.0000 mg | ORAL_TABLET | Freq: Two times a day (BID) | ORAL | Status: DC
  Administered 2022-05-23 – 2022-06-08 (×30): 25 mg via ORAL
  Filled 2022-05-22 (×17): qty 1
  Filled 2022-05-22: qty 4
  Filled 2022-05-22 (×12): qty 1

## 2022-05-22 MED ORDER — PENTOXIFYLLINE ER 400 MG PO TBCR
400.0000 mg | EXTENDED_RELEASE_TABLET | Freq: Three times a day (TID) | ORAL | Status: DC
  Administered 2022-05-23 – 2022-06-08 (×47): 400 mg via ORAL
  Filled 2022-05-22 (×53): qty 1

## 2022-05-22 MED ORDER — DOCUSATE SODIUM 50 MG/5ML PO LIQD
50.0000 mg | Freq: Two times a day (BID) | ORAL | Status: DC
  Administered 2022-05-22 – 2022-06-05 (×25): 50 mg via ORAL
  Filled 2022-05-22 (×34): qty 10

## 2022-05-22 MED ORDER — POLYSACCHARIDE IRON COMPLEX 150 MG PO CAPS
150.0000 mg | ORAL_CAPSULE | Freq: Every day | ORAL | Status: DC
  Administered 2022-05-23 – 2022-06-08 (×17): 150 mg via ORAL
  Filled 2022-05-22 (×17): qty 1

## 2022-05-22 MED ORDER — PRAZOSIN HCL 1 MG PO CAPS
1.0000 mg | ORAL_CAPSULE | Freq: Every day | ORAL | Status: DC
  Administered 2022-05-22 – 2022-06-07 (×16): 1 mg via ORAL
  Filled 2022-05-22 (×16): qty 1

## 2022-05-22 MED ORDER — SERTRALINE HCL 50 MG PO TABS
100.0000 mg | ORAL_TABLET | Freq: Every day | ORAL | Status: DC
  Administered 2022-05-23 – 2022-06-08 (×17): 100 mg via ORAL
  Filled 2022-05-22 (×17): qty 2

## 2022-05-22 MED ORDER — IPRATROPIUM-ALBUTEROL 0.5-2.5 (3) MG/3ML IN SOLN: 3.0000 mL | RESPIRATORY_TRACT | Status: DC | PRN

## 2022-05-22 MED ORDER — GABAPENTIN 300 MG PO CAPS
300.0000 mg | ORAL_CAPSULE | Freq: Two times a day (BID) | ORAL | Status: DC
  Administered 2022-05-22 – 2022-06-08 (×34): 300 mg via ORAL
  Filled 2022-05-22 (×34): qty 1

## 2022-05-22 MED ORDER — VITAMIN B-12 1000 MCG PO TABS
1000.0000 ug | ORAL_TABLET | Freq: Every day | ORAL | Status: DC
  Administered 2022-05-23 – 2022-06-08 (×17): 1000 ug via ORAL
  Filled 2022-05-22 (×9): qty 1
  Filled 2022-05-22: qty 2
  Filled 2022-05-22 (×7): qty 1

## 2022-05-22 MED ORDER — TRAZODONE HCL 50 MG PO TABS
75.0000 mg | ORAL_TABLET | Freq: Every day | ORAL | Status: DC
  Administered 2022-05-22 – 2022-06-07 (×17): 75 mg via ORAL
  Filled 2022-05-22 (×17): qty 2

## 2022-05-22 MED ORDER — PANTOPRAZOLE SODIUM 40 MG PO TBEC
40.0000 mg | DELAYED_RELEASE_TABLET | Freq: Every day | ORAL | Status: DC
  Administered 2022-05-23 – 2022-06-08 (×17): 40 mg via ORAL
  Filled 2022-05-22 (×17): qty 1

## 2022-05-22 NOTE — ED Notes (Signed)
First nurse note:  Pt here via [facility] with c/o of falling and states he was being released from [facility] and states pt fell after not standing for 2 weeks. Pt denies any complaints at this time.   127/56 HR:57 97% RA

## 2022-05-22 NOTE — ED Provider Triage Note (Signed)
Emergency Medicine Provider Triage Evaluation Note  Adam Merritt , a 76 y.o. male  was evaluated in triage.  Pt complains of weakness, fall. Patient placed in rehab after admission. States he had not been up or walked in last 2 weeks. Patient was being discharged and in the process of getting up into car he fell. R knee pain.  Review of Systems  Positive: Weakness, R knee pain, fall Negative: Fever, cough, HA, CP  Physical Exam  BP (!) 150/73   Pulse (!) 53   Temp 97.8 F (36.6 C) (Oral)   Resp 20   Ht '5\' 5"'$  (1.651 m)   Wt 131.1 kg   SpO2 94%   BMI 48.09 kg/m  Gen:   Awake, no distress   Resp:  Normal effort  MSK:   Moves extremities without difficulty  Other:    Medical Decision Making  Medically screening exam initiated at 7:14 PM.  Appropriate orders placed.  [name] was informed that the remainder of the evaluation will be completed by another provider, this initial triage assessment does not replace that evaluation, and the importance of remaining in the ED until their evaluation is complete.  Labs, urinalysis, xrays, EKG    Brynda Peon 05/22/22 1914

## 2022-05-22 NOTE — ED Triage Notes (Signed)
Wife reports patient was at [facility] and state they attempted to take patient home early from rehab and patient fell. Patient reports right knee pain. Patient with generalized weakness. Patient alert, resp even, unlabored on RA. 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 Wife states patient's cognition is at baseline and does not have any increased confusion. Patient alert, answers questions appropriately and follows commands. Resp even, unlabored on RA.

## 2022-05-22 NOTE — ED Provider Notes (Signed)
[facility] Provider Note    Event Date/Time   First MD Initiated Contact with Patient 05/22/22 2254     (approximate)   History   Fall   HPI  Adam Merritt is a 76 y.o. male here with fall.  The patient was just hospitalized for UTI.  Has an extensive past medical history and was hospitalized at [facility] for several weeks.  Per report, he was told that he was going to be discharged today because of insurance issues.  He states that he has not really been able to ambulate much and was somewhat hesitant about this.  He has not had home health set up.  When he is trying to get into his car earlier today, he reportedly went "limp" and weak, causing him to fall.  He was slowly lowered to the ground by his family who could not support him but was trying to ambulate him.  He states that he feels generally weak.  He does endorse some mild dysuria.  Denies any fevers or chills.  Wife is present with him and states that she cannot care for him like this at home.  He was ambulatory prior to his last hospitalization.   Physical Exam   Triage Vital Signs: ED Triage Vitals  Enc Vitals Group     BP 05/22/22 1912 (!) 150/73     Pulse Rate 05/22/22 1912 (!) 53     Resp 05/22/22 1912 20     Temp 05/22/22 1912 97.8 F (36.6 C)     Temp Source 05/22/22 1912 Oral     SpO2 05/22/22 1912 94 %     Weight 05/22/22 1913 289 lb (131.1 kg)     Height 05/22/22 1913 '5\' 5"'$  (1.651 m)     Head Circumference --      Peak Flow --      Pain Score --      Pain Loc --      Pain Edu? --      Excl. in [place]? --     Most recent vital signs: Vitals:   05/23/22 0100 05/23/22 0130  BP: 138/68 (!) 128/58  Pulse: (!) 58 (!) 57  Resp: 14 16  Temp:    SpO2: 93% 92%     General: Awake, no distress.  CV:  Good peripheral perfusion.  Regular rate and rhythm. Resp:  Normal effort.  Lungs clear to auscultation. Abd:  No distention.  No tenderness. Other:  Mild tenderness to palpation over  the right anterior knee.  No bruising or deformity.  No focal neurological deficits.  Bilateral 2+ edema with chronic lymphedema changes and chronic appearing wounds without surrounding erythema, warmth, fluctuance, or drainage.   ED Results / Procedures / Treatments   Labs (all labs ordered are listed, but only abnormal results are displayed) Labs Reviewed  COMPREHENSIVE METABOLIC PANEL - Abnormal; Notable for the following components:      Result Value   Glucose, Bld 137 (*)    Creatinine, Ser 1.43 (*)    Calcium 8.8 (*)    Albumin 2.9 (*)    AST 48 (*)    ALT 46 (*)    GFR, Estimated 50 (*)    All other components within normal limits  CBC WITH DIFFERENTIAL/PLATELET - Abnormal; Notable for the following components:   WBC 10.8 (*)    Neutro Abs 8.9 (*)    All other components within normal limits  URINALYSIS, ROUTINE W REFLEX MICROSCOPIC -  Abnormal; Notable for the following components:   Color, Urine YELLOW (*)    APPearance CLOUDY (*)    Leukocytes,Ua LARGE (*)    WBC, UA >50 (*)    Bacteria, UA RARE (*)    All other components within normal limits  RESP PANEL BY RT-PCR (RSV, FLU A&B, COVID)  RVPGX2  URINE CULTURE  AMMONIA  BRAIN NATRIURETIC PEPTIDE  BASIC METABOLIC PANEL  CBC  CBG MONITORING, ED  CBG MONITORING, ED  CBG MONITORING, ED  TROPONIN I (HIGH SENSITIVITY)  TROPONIN I (HIGH SENSITIVITY)     EKG Sinus bradycardia, ventricular at 55.  PR 198, QRS 110, QTc 468.  No acute ST elevations or depressions.   RADIOLOGY DG knee right: No fracture or dislocation Chest x-ray: Negative   I also independently reviewed and agree with radiologist interpretations.   PROCEDURES:  Critical Care performed: No   MEDICATIONS ORDERED IN ED: Medications  albuterol (PROVENTIL) (2.5 MG/3ML) 0.083% nebulizer solution 2.5 mg (has no administration in time range)  ascorbic acid (VITAMIN C) tablet 500 mg (has no administration in time range)  aspirin EC tablet 325 mg  (has no administration in time range)  atorvastatin (LIPITOR) tablet 40 mg (40 mg Oral Given 05/22/22 2344)  carvedilol (COREG) tablet 25 mg (has no administration in time range)  cyanocobalamin (VITAMIN B12) tablet 1,000 mcg (has no administration in time range)  docusate (COLACE) 50 MG/5ML liquid 50 mg (50 mg Oral Given 82/42/35 3614)  folic acid (FOLVITE) tablet 1 mg (has no administration in time range)  furosemide (LASIX) tablet 40 mg (has no administration in time range)  gabapentin (NEURONTIN) capsule 300 mg (300 mg Oral Given 05/22/22 2344)  insulin glargine-yfgn (SEMGLEE) injection 30 Units (has no administration in time range)  ipratropium-albuterol (DUONEB) 0.5-2.5 (3) MG/3ML nebulizer solution 3 mL (has no administration in time range)  iron polysaccharides (NIFEREX) capsule 150 mg (has no administration in time range)  pantoprazole (PROTONIX) EC tablet 40 mg (has no administration in time range)  pentoxifylline (TRENTAL) CR tablet 400 mg (has no administration in time range)  prazosin (MINIPRESS) capsule 1 mg (1 mg Oral Given 05/22/22 2344)  sertraline (ZOLOFT) tablet 100 mg (has no administration in time range)  spironolactone (ALDACTONE) tablet 12.5 mg (has no administration in time range)  traZODone (DESYREL) tablet 75 mg (75 mg Oral Given 05/22/22 2344)  cholecalciferol (VITAMIN D3) 25 MCG (1000 UNIT) tablet 1,000 Units (has no administration in time range)  multivitamin with minerals tablet 1 tablet (has no administration in time range)  enoxaparin (LOVENOX) injection 65 mg (has no administration in time range)  acetaminophen (TYLENOL) tablet 650 mg (has no administration in time range)    Or  acetaminophen (TYLENOL) suppository 650 mg (has no administration in time range)  magnesium hydroxide (MILK OF MAGNESIA) suspension 30 mL (has no administration in time range)  ondansetron (ZOFRAN) tablet 4 mg (has no administration in time range)    Or  ondansetron (ZOFRAN)  injection 4 mg (has no administration in time range)  cefTRIAXone (ROCEPHIN) 2 g in sodium chloride 0.9 % 100 mL IVPB (has no administration in time range)  cefTRIAXone (ROCEPHIN) 1 g in sodium chloride 0.9 % 100 mL IVPB (1 g Intravenous New Bag/Given 05/23/22 0239)  cefTRIAXone (ROCEPHIN) 1 g in sodium chloride 0.9 % 100 mL IVPB (0 g Intravenous Stopped 05/23/22 0214)     IMPRESSION / MDM / ASSESSMENT AND PLAN / ED COURSE  I reviewed the triage vital signs and the  nursing notes.                              Differential diagnosis includes, but is not limited to, generalized weakness, deconditioning, UTI, occult PNA, polypharmacy.  Patient's presentation is most consistent with acute presentation with potential threat to life or bodily function.  76 yo M with complex PMHx here with generalized weakness, fall. Pt was just discharged from SNF today but is unable to ambulate or care for himself. From a fall perspective, CXR, XR knee reviewed and are negative. There was no head trauma. From weakness perspective, concern for possible recurrent UTI. WIll check labs, UA, reassess. Suspect pt will need admission vs SNF.     FINAL CLINICAL IMPRESSION(S) / ED DIAGNOSES   Final diagnoses:  Fall, initial encounter  Weakness  Lower urinary tract infectious disease     Rx / DC Orders   ED Discharge Orders     None        Note:  This document was prepared using Dragon voice recognition software and may include unintentional dictation errors.   Duffy Bruce, MD 05/23/22 [id number]

## 2022-05-23 ENCOUNTER — Encounter: Payer: Self-pay | Admitting: Family Medicine

## 2022-05-23 DIAGNOSIS — N390 Urinary tract infection, site not specified: Secondary | ICD-10-CM

## 2022-05-23 DIAGNOSIS — R531 Weakness: Secondary | ICD-10-CM | POA: Diagnosis not present

## 2022-05-23 DIAGNOSIS — E1142 Type 2 diabetes mellitus with diabetic polyneuropathy: Secondary | ICD-10-CM | POA: Insufficient documentation

## 2022-05-23 DIAGNOSIS — R55 Syncope and collapse: Secondary | ICD-10-CM | POA: Diagnosis not present

## 2022-05-23 DIAGNOSIS — W19XXXA Unspecified fall, initial encounter: Secondary | ICD-10-CM

## 2022-05-23 DIAGNOSIS — E785 Hyperlipidemia, unspecified: Secondary | ICD-10-CM | POA: Diagnosis not present

## 2022-05-23 LAB — CBC
HCT: 38.8 % — ABNORMAL LOW (ref 39.0–52.0)
Hemoglobin: 12.4 g/dL — ABNORMAL LOW (ref 13.0–17.0)
MCH: 29 pg (ref 26.0–34.0)
MCHC: 32 g/dL (ref 30.0–36.0)
MCV: 90.7 fL (ref 80.0–100.0)
Platelets: 151 10*3/uL (ref 150–400)
RBC: 4.28 MIL/uL (ref 4.22–5.81)
RDW: 13.5 % (ref 11.5–15.5)
WBC: 9.6 10*3/uL (ref 4.0–10.5)
nRBC: 0 % (ref 0.0–0.2)

## 2022-05-23 LAB — URINALYSIS, ROUTINE W REFLEX MICROSCOPIC
Bilirubin Urine: NEGATIVE
Glucose, UA: NEGATIVE mg/dL
Hgb urine dipstick: NEGATIVE
Ketones, ur: NEGATIVE mg/dL
Nitrite: NEGATIVE
Protein, ur: NEGATIVE mg/dL
Specific Gravity, Urine: 1.015 (ref 1.005–1.030)
WBC, UA: >50 WBC/hpf — ABNORMAL HIGH (ref 0–5)
pH: 5 (ref 5.0–8.0)

## 2022-05-23 LAB — BASIC METABOLIC PANEL
Anion gap: 8 (ref 5–15)
BUN: 24 mg/dL — ABNORMAL HIGH (ref 8–23)
CO2: 26 mmol/L (ref 22–32)
Calcium: 8.7 mg/dL — ABNORMAL LOW (ref 8.9–10.3)
Chloride: 103 mmol/L (ref 98–111)
Creatinine, Ser: 1.41 mg/dL — ABNORMAL HIGH (ref 0.61–1.24)
GFR, Estimated: 51 mL/min — ABNORMAL LOW (ref >60)
Glucose, Bld: 122 mg/dL — ABNORMAL HIGH (ref 70–99)
Potassium: 4.3 mmol/L (ref 3.5–5.1)
Sodium: 137 mmol/L (ref 135–145)

## 2022-05-23 LAB — CBG MONITORING, ED: Glucose-Capillary: 142 mg/dL — ABNORMAL HIGH (ref 70–99)

## 2022-05-23 LAB — TROPONIN I (HIGH SENSITIVITY): Troponin I (High Sensitivity): 5 ng/L (ref <18)

## 2022-05-23 LAB — GLUCOSE, CAPILLARY
Glucose-Capillary: 119 mg/dL — ABNORMAL HIGH (ref 70–99)
Glucose-Capillary: 120 mg/dL — ABNORMAL HIGH (ref 70–99)

## 2022-05-23 MED ORDER — IPRATROPIUM-ALBUTEROL 0.5-2.5 (3) MG/3ML IN SOLN
3.0000 mL | RESPIRATORY_TRACT | Status: DC | PRN
  Administered 2022-05-25 – 2022-06-04 (×3): 3 mL via RESPIRATORY_TRACT
  Filled 2022-05-23 (×4): qty 3

## 2022-05-23 MED ORDER — SENNOSIDES-DOCUSATE SODIUM 8.6-50 MG PO TABS: 1.0000 | ORAL_TABLET | Freq: Every evening | ORAL | Status: DC | PRN

## 2022-05-23 MED ORDER — ACETAMINOPHEN 325 MG PO TABS: 650.0000 mg | ORAL_TABLET | Freq: Four times a day (QID) | ORAL | Status: DC | PRN

## 2022-05-23 MED ORDER — SODIUM CHLORIDE 0.9 % IV SOLN
1.0000 g | Freq: Once | INTRAVENOUS | Status: AC
  Administered 2022-05-23: 1 g via INTRAVENOUS
  Filled 2022-05-23: qty 10

## 2022-05-23 MED ORDER — MAGNESIUM HYDROXIDE 400 MG/5ML PO SUSP: 30.0000 mL | Freq: Every day | ORAL | Status: DC | PRN

## 2022-05-23 MED ORDER — METOPROLOL TARTRATE 5 MG/5ML IV SOLN: 5.0000 mg | INTRAVENOUS | Status: DC | PRN

## 2022-05-23 MED ORDER — GUAIFENESIN 100 MG/5ML PO LIQD
5.0000 mL | ORAL | Status: DC | PRN
  Administered 2022-05-24 – 2022-05-30 (×2): 5 mL via ORAL
  Filled 2022-05-23 (×2): qty 10

## 2022-05-23 MED ORDER — INSULIN ASPART 100 UNIT/ML IJ SOLN
0.0000 [IU] | Freq: Three times a day (TID) | INTRAMUSCULAR | Status: DC
  Administered 2022-05-23 – 2022-06-04 (×8): 1 [IU] via SUBCUTANEOUS
  Administered 2022-06-05: 2 [IU] via SUBCUTANEOUS
  Administered 2022-06-05 (×2): 1 [IU] via SUBCUTANEOUS
  Administered 2022-06-06: 2 [IU] via SUBCUTANEOUS
  Administered 2022-06-06 – 2022-06-08 (×3): 1 [IU] via SUBCUTANEOUS
  Filled 2022-05-23 (×15): qty 1

## 2022-05-23 MED ORDER — ONDANSETRON HCL 4 MG/2ML IJ SOLN
4.0000 mg | Freq: Four times a day (QID) | INTRAMUSCULAR | Status: DC | PRN
  Administered 2022-05-27: 4 mg via INTRAVENOUS
  Filled 2022-05-23: qty 2

## 2022-05-23 MED ORDER — ACETAMINOPHEN 325 MG PO TABS
650.0000 mg | ORAL_TABLET | Freq: Four times a day (QID) | ORAL | Status: DC | PRN
  Administered 2022-05-24 – 2022-06-05 (×3): 650 mg via ORAL
  Filled 2022-05-23 (×3): qty 2

## 2022-05-23 MED ORDER — ACETAMINOPHEN 650 MG RE SUPP: 650.0000 mg | Freq: Four times a day (QID) | RECTAL | Status: DC | PRN

## 2022-05-23 MED ORDER — ONDANSETRON HCL 4 MG PO TABS
4.0000 mg | ORAL_TABLET | Freq: Four times a day (QID) | ORAL | Status: DC | PRN
  Administered 2022-05-29 – 2022-06-03 (×3): 4 mg via ORAL
  Filled 2022-05-23 (×3): qty 1

## 2022-05-23 MED ORDER — HYDRALAZINE HCL 20 MG/ML IJ SOLN: 10.0000 mg | INTRAMUSCULAR | Status: DC | PRN

## 2022-05-23 MED ORDER — TRAZODONE HCL 50 MG PO TABS: 25.0000 mg | ORAL_TABLET | Freq: Every evening | ORAL | Status: DC | PRN

## 2022-05-23 MED ORDER — TRAZODONE HCL 50 MG PO TABS: 50.0000 mg | ORAL_TABLET | Freq: Every evening | ORAL | Status: DC | PRN

## 2022-05-23 MED ORDER — ADULT MULTIVITAMIN W/MINERALS CH
1.0000 | ORAL_TABLET | Freq: Every day | ORAL | Status: DC
  Administered 2022-05-23 – 2022-06-08 (×17): 1 via ORAL
  Filled 2022-05-23 (×17): qty 1

## 2022-05-23 MED ORDER — SODIUM CHLORIDE 0.9 % IV SOLN
2.0000 g | INTRAVENOUS | Status: DC
  Administered 2022-05-24 – 2022-05-25 (×2): 2 g via INTRAVENOUS
  Filled 2022-05-23 (×2): qty 2

## 2022-05-23 MED ORDER — ENOXAPARIN SODIUM 80 MG/0.8ML IJ SOSY
0.5000 mg/kg | PREFILLED_SYRINGE | INTRAMUSCULAR | Status: DC
  Administered 2022-05-23 – 2022-06-08 (×17): 65 mg via SUBCUTANEOUS
  Filled 2022-05-23 (×18): qty 0.65

## 2022-05-23 NOTE — Assessment & Plan Note (Signed)
-   The patient will be placed on supplemental coverage with NovoLog and will continue his basal coverage. - We will continue Neurontin.

## 2022-05-23 NOTE — H&P (Addendum)
[place]   PATIENT NAME: Adam Merritt    MR#:  [id number]  DATE OF BIRTH:  1946/03/02  DATE OF ADMISSION:  05/22/2022  PRIMARY CARE PHYSICIAN: [name], MD   Patient is coming from: Home  REQUESTING/REFERRING PHYSICIAN: Harvest Dark, MD  CHIEF COMPLAINT:   Chief Complaint  Patient presents with   Fall    HISTORY OF PRESENT ILLNESS:  Adam Merritt is a 76 y.o. 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  The patient was significantly weak he was not able to stand without assistance.  He admitted to urinary frequency and dysuria without hematuria or flank pain.  No nausea or vomiting or abdominal pain.  No chest pain or palpitations.  No cough or wheezing or dyspnea.  ED Course: When he came to the ER, BP was 150/73 with heart rate of 53 and otherwise normal vital signs.  Labs revealed a creatinine 1.43 better than previous levels.  AST was 48 and ALT 46 and GFR 50 BNP was 62.6.  High sensitive troponin was 6 and later 5.  CBC showed WBC of 10.8.  Influenza antigens and COVID-19 PCR as well as RSV PCR came back negative.  UA  was positive for UTI.  EKG as reviewed by me : Sinus bradycardia with a rate of 55 Imaging: Portable chest ray showed no acute cardiopulmonary disease.  4 view knee x-ray showed small suprapatellar knee joint effusion with no fracture or dislocation.  The patient was given 1 g of IV Rocephin.  He will be admitted to a medical telemetry observation bed for further evaluation and management.  PAST MEDICAL HISTORY:   Past Medical History:  Diagnosis Date   Anemia    CAD (coronary artery  disease)    Chest pain    Coronary artery disease    Diabetes mellitus without complication (HCC)    Diastolic CHF ([place])    Esophageal reflux    Herpes zoster without mention of complication    Hyperlipidemia    Hypertension    Insomnia    Lumbago    Lymphedema    Neuropathy in diabetes ([place])    Obstructive chronic bronchitis without exacerbation    Other malaise and fatigue    Stroke ([place])    Varicose veins     PAST SURGICAL HISTORY:   Past Surgical History:  Procedure Laterality Date   AMPUTATION TOE Right 06/23/2018   Procedure: AMPUTATION TOE;  Surgeon: Sharlotte Alamo, DPM;  Location: [facility] ORS;  Service: Podiatry;  Laterality: Right;   CHOLECYSTECTOMY     CORONARY ARTERY BYPASS GRAFT      SOCIAL HISTORY:   Social History   Tobacco Use   Smoking status: Never   Smokeless tobacco: Never  Substance Use Topics   Alcohol use: No    Comment: quit alcohol 30 years ago    FAMILY HISTORY:   Family History  Problem Relation Age of Onset   Diabetes Mother    Hyperlipidemia Mother    Hypertension Mother    Diabetes Father    Hyperlipidemia Father    Hypertension Father    CAD Father     DRUG ALLERGIES:  No Known Allergies  REVIEW OF  SYSTEMS:   ROS As per history of present illness. All pertinent systems were reviewed above. Constitutional, HEENT, cardiovascular, respiratory, GI, GU, musculoskeletal, neuro, psychiatric, endocrine, integumentary and hematologic systems were reviewed and are otherwise negative/unremarkable except for positive findings mentioned above in the HPI.   MEDICATIONS AT HOME:   Prior to Admission medications   Medication Sig Start Date End Date Taking? Authorizing Provider  ascorbic acid (VITAMIN C) 500 MG tablet Take 1 tablet (500 mg total) by mouth daily. 05/06/22  Yes Lorella Nimrod, MD  aspirin 325 MG EC tablet Take 325 mg by mouth daily.   Yes [provider]  atorvastatin (LIPITOR) 40 MG tablet Take 40 mg by mouth at  bedtime.    Yes [provider]  carvedilol (COREG) 25 MG tablet Take 25 mg by mouth 2 (two) times daily with a meal.   Yes [provider]  Continuous Blood Gluc Sensor (FREESTYLE LIBRE 2 SENSOR) MISC 1 Device by Does not apply route every 14 (fourteen) days. 03/21/21  Yes Abernathy, Yetta Flock, NP  cyanocobalamin 1000 MCG tablet Take 1 tablet (1,000 mcg total) by mouth daily. 05/07/22  Yes Lorella Nimrod, MD  docusate sodium (COLACE) 50 MG capsule Take 50 mg by mouth 2 (two) times daily.   Yes [provider]  folic acid (FOLVITE) 1 MG tablet Take 1 tablet (1 mg total) by mouth daily. 05/06/22  Yes Lorella Nimrod, MD  furosemide (LASIX) 40 MG tablet TAKE 1 TABLET(40 MG) BY MOUTH DAILY 04/13/22  Yes Abernathy, Alyssa, NP  gabapentin (NEURONTIN) 300 MG capsule TAKE 2 CAPSULES BY MOUTH EVERY MORNING, 1 CAPSULE EVERY EVENING AND 2 CAPSULES AT NIGHT 09/12/21  Yes Abernathy, Alyssa, NP  glucose blood test strip Use as instructed to check blood sugars twice daily 02/23/20  Yes [name], MD  Insulin Glargine [facility]) 100 UNIT/ML Inject 30 Units into the skin daily. 04/10/22  Yes Abernathy, Yetta Flock, NP  Insulin Pen Needle (BD PEN NEEDLE NANO U/F) 32G X 4 MM MISC Use as directed with insulin DX e11.65 01/10/21  Yes [name], MD  iron polysaccharides (NIFEREX) 150 MG capsule Take 1 capsule (150 mg total) by mouth daily. 05/06/22  Yes Lorella Nimrod, MD  liraglutide (VICTOZA) 18 MG/3ML SOPN INJECT 1.'8MG'$  INTO THE SKIN DAILY 06/22/21  Yes Abernathy, Alyssa, NP  mometasone (ELOCON) 0.1 % lotion Apply three times weekly as directed. 05/23/21  Yes Ralene Bathe, MD  Multiple Vitamin (MULTIVITAMIN WITH MINERALS) TABS tablet Take 1 tablet by mouth daily.   Yes [provider]  omeprazole (PRILOSEC) 20 MG capsule Take 20 mg by mouth daily. 02/08/22  Yes [provider]  OneTouch Delica Lancets 62B MISC Use twice daily diag e11.65 02/23/20  Yes [name], MD   pentoxifylline (TRENTAL) 400 MG CR tablet TAKE 1 TABLET BY MOUTH THREE TIMES DAILY FOR CLAUDICATION 10/05/21  Yes Abernathy, Alyssa, NP  prazosin (MINIPRESS) 1 MG capsule TAKE THREE CAPSULES BY MOUTH EVERY EVENING FOR NIGHTMARES 07/01/20  Yes [provider]  sennosides-docusate sodium (SENOKOT-S) 8.6-50 MG tablet Take 1-2 tablets by mouth 2 (two) times daily.   Yes [provider]  sertraline (ZOLOFT) 100 MG tablet Take 100 mg by mouth daily.  10/17/18  Yes [provider]  spironolactone (ALDACTONE) 25 MG tablet Take 12.5 mg by mouth daily.   Yes [provider]  traZODone (DESYREL) 150 MG tablet Take 75 mg by mouth at bedtime.   Yes [provider]  triamcinolone  cream (KENALOG) 0.1 % Apply 1 application topically 2 (two) times daily. 12/02/20  Yes Abernathy, Yetta Flock, NP  vitamin D3 (CHOLECALCIFEROL) 25 MCG tablet Take 1,000 Units by mouth daily.   Yes [provider]  albuterol (VENTOLIN HFA) 108 (90 Base) MCG/ACT inhaler Inhale 2 puffs into the lungs every 6 (six) hours as needed for wheezing or shortness of breath. 02/09/21   Jonetta Osgood, NP  ipratropium-albuterol (DUONEB) 0.5-2.5 (3) MG/3ML SOLN Take 3 mLs by nebulization every 4 (four) hours as needed (for shortness of breath). 06/15/21   Merlyn Lot, MD  polyethylene glycol (MIRALAX / GLYCOLAX) 17 g packet Take 17 g by mouth daily as needed for mild constipation or moderate constipation. 05/05/22   Lorella Nimrod, MD      VITAL SIGNS:  Blood pressure 136/62, pulse 63, temperature 97.8 F (36.6 C), temperature source Oral, resp. rate (!) 29, height '5\' 5"'$  (1.651 m), weight 131.1 kg, SpO2 95 %.  PHYSICAL EXAMINATION:  Physical Exam  GENERAL:  76 y.o.-year-old Caucasian  male patient lying in the bed with no acute distress.  EYES: Pupils equal, round, reactive to light and accommodation. No scleral icterus. Extraocular muscles intact.  HEENT: Head atraumatic, normocephalic.  Oropharynx and nasopharynx clear.  NECK:  Supple, no jugular venous distention. No thyroid enlargement, no tenderness.  LUNGS: Normal breath sounds bilaterally, no wheezing, rales,rhonchi or crepitation. No use of accessory muscles of respiration.  CARDIOVASCULAR: Regular rate and rhythm, S1, S2 normal. No murmurs, rubs, or gallops.  ABDOMEN: Soft, nondistended, nontender. Bowel sounds present. No organomegaly or mass.  EXTREMITIES: Wrapped both legs with associated lymphedema and lichenification of the foot and right distal big toe amputation with no cyanosis, or clubbing.  NEUROLOGIC: Cranial nerves II through XII are intact. Muscle strength 5/5 in all extremities. Sensation intact. Gait not checked.  PSYCHIATRIC: The patient is alert and oriented x 3.  Normal affect and good eye contact. SKIN: No obvious rash, lesion, or ulcer.   LABORATORY PANEL:   CBC Recent Labs  Lab 05/23/22 0450  WBC 9.6  HGB 12.4*  HCT 38.8*  PLT 151   ------------------------------------------------------------------------------------------------------------------  Chemistries  Recent Labs  Lab 05/22/22 1917 05/23/22 0450  NA 135 137  K 3.5 4.3  CL 100 103  CO2 27 26  GLUCOSE 137* 122*  BUN 23 24*  CREATININE 1.43* 1.41*  CALCIUM 8.8* 8.7*  AST 48*  --   ALT 46*  --   ALKPHOS 83  --   BILITOT 0.7  --    ------------------------------------------------------------------------------------------------------------------  Cardiac Enzymes No results for input(s): "TROPONINI" in the last 168 hours. ------------------------------------------------------------------------------------------------------------------  RADIOLOGY:  DG Chest 1 View  Result Date: 05/22/2022 CLINICAL DATA:  Fall, weakness EXAM: CHEST  1 VIEW COMPARISON:  04/27/2022 FINDINGS: Lungs are clear.  No pleural effusion or pneumothorax. The heart is normal in size. Postsurgical changes related to prior CABG. Median sternotomy.  IMPRESSION: No evidence of acute cardiopulmonary disease. Electronically Signed   By: Julian Hy M.D.   On: 05/22/2022 20:00   DG Knee Complete 4 Views Right  Result Date: 05/22/2022 CLINICAL DATA:  Fall, right knee pain EXAM: RIGHT KNEE - COMPLETE 4+ VIEW COMPARISON:  04/29/2022 FINDINGS: No fracture or dislocation is seen. The joint spaces are preserved. Visualized soft tissues are within normal limits. Small suprapatellar knee joint effusion. IMPRESSION: No fracture or dislocation is seen. Small suprapatellar knee joint effusion. Electronically Signed   By: Julian Hy M.D.   On: 05/22/2022 19:59  IMPRESSION AND PLAN:  Assessment and Plan: * Syncope - This could be related to generalized weakness and orthostatic hypotension in the setting of acute UTI.  It led to a fall without head injuries. - Differential diagnosis would include arrhythmia related syncope, cardiogenic and neurally mediated as well as hypoglycemia. - He will be admitted to a an observation medical telemetry bed. - We will check orthostatics. - We will hydrate with IV normal saline.  Acute lower UTI - We will place him on IV Rocephin and follow urine culture and sensitivity. - This could certainly be contributing to his generalized weakness  Type 2 diabetes mellitus with peripheral neuropathy ([place]) - The patient will be placed on supplemental coverage with NovoLog and will continue his basal coverage. - We will continue Neurontin.  Dyslipidemia - We will continue statin therapy   DVT prophylaxis: Lovenox.  Advanced Care Planning:  Code Status: full code.  Family Communication:  The plan of care was discussed in details with the patient (and family). I answered all questions. The patient agreed to proceed with the above mentioned plan. Further management will depend upon hospital course. Disposition Plan: Back to previous home environment Consults called: none.  All the records are reviewed and  case discussed with ED provider.  Status is: Observation h intensity of service, high risk of further deterioration and high frequency of surveillance required.  I certify that at the time of admission, it is my clinical judgment that the patient will require hospital care extending less than 2 midnights.                            Dispo: The patient is from: Home              Anticipated d/c is to: Home              Patient currently is not medically stable to d/c.              Difficult to place patient: No  Christel Mormon M.D on 05/23/2022 at 6:37 AM  [facility]   From 7 PM-7 AM, contact night-coverage [email or web address]  CC: Primary care physician; [name], MD

## 2022-05-23 NOTE — Progress Notes (Signed)
Anticoagulation monitoring(Lovenox):  76 yo  male ordered Lovenox 40 mg Q24h    Filed Weights   05/22/22 1913  Weight: 131.1 kg (289 lb)   BMI 48   Lab Results  Component Value Date   CREATININE 1.43 (H) 05/22/2022   CREATININE 1.51 (H) 05/05/2022   CREATININE 1.58 (H) 05/04/2022   Estimated Creatinine Clearance: 54.6 mL/min (A) (by C-G formula based on SCr of 1.43 mg/dL (H)). Hemoglobin & Hematocrit     Component Value Date/Time   HGB 13.1 05/22/2022 1917   HGB 13.8 09/28/2014 1320   HCT 41.6 05/22/2022 1917   HCT 40.9 09/28/2014 1320     Per Protocol for Patient with estCrcl > 30 ml/min and BMI > 30, will transition to Lovenox 65 mg Q24h.

## 2022-05-23 NOTE — ED Notes (Signed)
Delayed in administering the pentoxifylline and the enoxaparin due to waiting for them to arrive from [place].

## 2022-05-23 NOTE — Assessment & Plan Note (Signed)
-   We will continue statin therapy. 

## 2022-05-23 NOTE — ED Notes (Signed)
In and out cath done to obtain urine. Prima fit placed.

## 2022-05-23 NOTE — Evaluation (Signed)
Occupational Therapy Evaluation Patient Details Name: Adam Merritt MRN: [id number] DOB: 01-Jan-1946 Today's Date: 05/23/2022   History of Present Illness Adam Merritt is a 80yoM who comes to [facility] 12/18 after a fall. Pt suspected to have a syncopal event in the setting of UTI. PMH: DM2, Stg3a CKD, CAD, HTN, lymphedema, diabetic neuropathy, GERD, venous insufficiency with BLE ulceratons. Reportedly pt has recently been at [facility] for rehab but never made it home due to fall/LOC in parking lot. 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 He has more recent BUE tremor in past few months.   Clinical Impression   Patient seen for OT evaluation, spouse present. Pt presenting with decreased independence in self care, balance, functional mobility/transfers, and endurance. Prior to recent hospitalizations and rehab stay, pt lived with spouse who provided assistance for all ADLs/IADLs. Pt currently functioning at Max-Total A for all ADLs. He required Max A +2 for rolling L<>R in order to repositioned in bed and to scoop up towards [place]. Pt will benefit from acute OT to increase overall independence in the areas of ADLs and functional mobility in order to safely discharge to next venue of care. Upon hospital discharge, recommend STR to maximize pt safety and return to PLOF.   SpO2 maintained >90% on RA. BP in supine: 130/88 (MAP 97).     Recommendations for follow up therapy are one component of a multi-disciplinary discharge planning process, led by the attending physician.  Recommendations may be updated based on patient status, additional functional criteria and insurance authorization.   Follow Up Recommendations  Skilled nursing-short term rehab (<3 hours/day)     Assistance Recommended at Discharge Frequent or constant Supervision/Assistance  Patient can return home with the following Two people to help with walking and/or transfers;Two people to help with bathing/dressing/bathroom;Assistance with  cooking/housework;Assist for transportation;Help with stairs or ramp for entrance;Assistance with feeding    Functional Status Assessment  Patient has had a recent decline in their functional status and demonstrates the ability to make significant improvements in function in a reasonable and predictable amount of time.  Equipment Recommendations  Other (comment) (defer to next venue of care)    Recommendations for Other Services       Precautions / Restrictions Precautions Precautions: Fall Restrictions Weight Bearing Restrictions: No      Mobility Bed Mobility Overal bed mobility: Needs Assistance Bed Mobility: Rolling Rolling: Max assist, +2 for physical assistance         General bed mobility comments: rolling L<>R in order to reposition, +2 for scooting up toward [facility]    Transfers                   General transfer comment: pt deferred      Balance Overall balance assessment: Needs assistance     Sitting balance - Comments: pt deferred                                   ADL either performed or assessed with clinical judgement   ADL Overall ADL's : Needs assistance/impaired Eating/Feeding: Set up;Bed level Eating/Feeding Details (indicate cue type and reason): OT providing set up A then wife assisting with self-feeding                                   General ADL Comments: Pt requiring Max-Total  A for all ADLs     Vision Baseline Vision/History: 1 Wears glasses Additional Comments: vision impairments at baseline     Perception     Praxis      Pertinent Vitals/Pain Pain Assessment Pain Assessment: Faces Faces Pain Scale: Hurts a little bit Pain Location: R knee Pain Descriptors / Indicators: Aching, Discomfort Pain Intervention(s): Monitored during session, Repositioned, Limited activity within patient's tolerance     Hand Dominance Left   Extremity/Trunk Assessment Upper Extremity Assessment Upper  Extremity Assessment: Generalized weakness (BUE tremors)   Lower Extremity Assessment Lower Extremity Assessment: Generalized weakness       Communication Communication Communication: HOH   Cognition Arousal/Alertness: Awake/alert Behavior During Therapy: WFL for tasks assessed/performed Overall Cognitive Status: Within Functional Limits for tasks assessed                                       General Comments       Exercises Other Exercises Other Exercises: OT provided education re: role of OT, OT POC, post acute recs, sitting up for all meals, EOB/OOB mobility with assistance, home/fall safety.     Shoulder Instructions      Home Living Family/patient expects to be discharged to:: Private residence Living Arrangements: Spouse/significant other Available Help at Discharge: Family;Available 24 hours/day Type of Home: House Home Access: Level entry     Home Layout: One level     Bathroom Shower/Tub: Occupational psychologist: Handicapped height Bathroom Accessibility: Yes   Home Equipment: Conservation officer, nature (2 wheels);Wheelchair - manual;Electric scooter;Grab bars - tub/shower;Shower seat - built in          Prior Functioning/Environment Prior Level of Function : Needs assist       Physical Assist : Mobility (physical);ADLs (physical) Mobility (physical): Transfers;Gait ADLs (physical): Bathing;Dressing;Toileting;IADLs;Grooming;Feeding Mobility Comments: pt was previously able to complete transfers without physical assistance, but for the past several weeks he has become progressively more weak and requiring lots of physical help from his wife to transfer into/out of his w/c. He is unable to propel himself once in the w/c ADLs Comments: Wife assists with all ADLs/IADLs        OT Problem List: Decreased strength;Decreased range of motion;Decreased activity tolerance;Impaired balance (sitting and/or standing);Impaired UE functional  use;Pain;Decreased coordination;Increased edema;Decreased knowledge of precautions;Decreased knowledge of use of DME or AE;Decreased safety awareness;Impaired vision/perception      OT Treatment/Interventions: Self-care/ADL training;Therapeutic exercise;Therapeutic activities;DME and/or AE instruction;Patient/family education;Balance training    OT Goals(Current goals can be found in the care plan section) Acute Rehab OT Goals Patient Stated Goal: pt/spouse agreeable to rehab OT Goal Formulation: With patient/family Time For Goal Achievement: 06/06/22 Potential to Achieve Goals: Fair   OT Frequency: Min 2X/week    Co-evaluation              AM-PAC OT "6 Clicks" Daily Activity     Outcome Measure Help from another person eating meals?: A Lot Help from another person taking care of personal grooming?: A Lot Help from another person toileting, which includes using toliet, bedpan, or urinal?: Total Help from another person bathing (including washing, rinsing, drying)?: Total Help from another person to put on and taking off regular upper body clothing?: A Lot Help from another person to put on and taking off regular lower body clothing?: Total 6 Click Score: 9   End of Session Nurse Communication: Mobility status  Activity Tolerance: Patient limited by fatigue;Patient limited by pain Patient left: in bed;with call bell/phone within reach;with family/visitor present  OT Visit Diagnosis: Other abnormalities of gait and mobility (R26.89);Muscle weakness (generalized) (M62.81);Pain Pain - Right/Left: Right Pain - part of body: Knee                Time: 9417-4081 OT Time Calculation (min): 20 min Charges:  OT General Charges $OT Visit: 1 Visit OT Evaluation $OT Eval Low Complexity: 1 Low  [facility] MS, OTR/L ascom [phone]  05/23/22, 1:33 PM

## 2022-05-23 NOTE — ED Provider Notes (Signed)
-----------------------------------------   1:57 AM on 05/23/2022 ----------------------------------------- Patient care assumed from Dr. Ellender Hose.  Patient is here in the emergency department for increased weakness unable to ambulate at home due to weakness requiring significant assistance in the emergency department.  Patient's lab work has resulted showing a urinalysis with greater than 50 white cells and white blood cell clumps along with a slight leukocytosis on his CBC.  Given the patient's increased weakness and urinary tract infection we will dose IV Rocephin send urine culture and admit to the hospital service for further workup and treatment.   Harvest Dark, MD 05/23/22 0157

## 2022-05-23 NOTE — Assessment & Plan Note (Signed)
-   We will place him on IV Rocephin and follow urine culture and sensitivity. - This could certainly be contributing to his generalized weakness

## 2022-05-23 NOTE — ED Notes (Signed)
Pt turned from left side to right side. Pt brief and sheets changed. Pt provided with water.

## 2022-05-23 NOTE — Assessment & Plan Note (Addendum)
-   This could be related to generalized weakness and orthostatic hypotension in the setting of acute UTI.  It led to a fall without head injuries. - Differential diagnosis would include arrhythmia related syncope, cardiogenic and neurally mediated as well as hypoglycemia. - He will be admitted to a an observation medical telemetry bed. - We will check orthostatics. - We will hydrate with IV normal saline.

## 2022-05-23 NOTE — Evaluation (Signed)
Physical Therapy Evaluation Patient Details Name: Adam Merritt MRN: [id number] DOB: May 31, 1946 Today's Date: 05/23/2022  History of Present Illness  Adam Merritt is a 22yoM who comes to [facility] 12/18 after a fall. Pt suspected to have a syncopal event in the setting of UTI. PMH: DM2, Stg3a CKD, CAD, HTN, lymphedema, diabetic neuropathy, GERD, venous insufficiency with BLE ulceratons. Reportedly pt has recently been at [facility] for rehab but never made it home due to fall/LOC in parking lot. 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 He has more recent BUE tremor in past few months.  Clinical Impression  Pt admitted with above Dx. Pt has functional limitations due to deficits below (see "PT Problem List"). Pt able to provide details on baseline functional status, also confirmed with wife. Today pt requires total physical assistance to perform basic bed mobility, too weak to attempt transfers, and short distance walking really not appropraite given his frail baseline and current state. Pt may be nearing a new level of function in which he needs more physical asssit with transfers at home however it remains unclear if his wife and additional equipment could safely meet pt's needs, will continue to explore. Pt satting well on room air, left sitting up tall. He is very hypotensive when flat, but normostatic upon sitting- has a neurogenic lack of HR response to these BP and positional changes. Patient's performance this date reveals decreased ability, independence, and tolerance in performing all basic mobility required for performance of activities of daily living. Pt requires additional DME, close physical assistance, and cues for safe participate in mobility. Pt will benefit from skilled PT intervention to increase independence and safety with basic mobility in preparation for discharge to the venue listed below.     Orthostatic VS for the past 24 hrs (Last 3 readings):  BP- Lying Pulse- Lying BP- Sitting Pulse-  Sitting  05/23/22 1041 (!) 85/67 66 98/64 64  05/23/22 1022 120/62 63 102/58 65        Recommendations for follow up therapy are one component of a multi-disciplinary discharge planning process, led by the attending physician.  Recommendations may be updated based on patient status, additional functional criteria and insurance authorization.  Follow Up Recommendations Skilled nursing-short term rehab (<3 hours/day) (unclear that pt desires to return to STR at this time, however he would need extensive DME and human assistance to return to home in safe conditions) Can patient physically be transported by private vehicle: No    Assistance Recommended at Discharge Frequent or constant Supervision/Assistance  Patient can return home with the following  Two people to help with walking and/or transfers;Two people to help with bathing/dressing/bathroom;Assistance with cooking/housework;Assistance with feeding;Direct supervision/assist for medications management;Assist for transportation;Help with stairs or ramp for entrance    Equipment Recommendations  (Pt would need a hoyer lift to return to home.)  Recommendations for Other Services       Functional Status Assessment Patient has had a recent decline in their functional status and demonstrates the ability to make significant improvements in function in a reasonable and predictable amount of time.     Precautions / Restrictions Precautions Precautions: Fall Restrictions Weight Bearing Restrictions: No      Mobility  Bed Mobility Overal bed mobility: Needs Assistance Bed Mobility: Supine to Sit     Supine to sit: Total assist     General bed mobility comments: very limited ability to help with BUE on bed rails    Transfers  Ambulation/Gait                  Stairs            Wheelchair Mobility    Modified Rankin (Stroke Patients Only)       Balance                                              Pertinent Vitals/Pain Pain Assessment Pain Assessment: No/denies pain    Home Living Family/patient expects to be discharged to:: Private residence Living Arrangements: Spouse/significant other Available Help at Discharge: Family Type of Home: House Home Access: Level entry       Home Layout: One level Home Equipment: Conservation officer, nature (2 wheels);Wheelchair - Forensic scientist      Prior Function Prior Level of Function : Needs assist       Physical Assist : Mobility (physical);ADLs (physical) Mobility (physical): Transfers;Gait ADLs (physical): Bathing;Dressing;Toileting;IADLs Mobility Comments: pt was previously able to complete transfers without physical assistance, but for the past several weeks he has become progressively more weak and requiring lots of physical help from his wife to transfer into/out of his w/c. He is unable to propel himself once in the w/c ADLs Comments: wife assists with LB dressing and bathing     Hand Dominance   Dominant Hand: Left    Extremity/Trunk Assessment                Communication      Cognition Arousal/Alertness: Awake/alert Behavior During Therapy: WFL for tasks assessed/performed Overall Cognitive Status: Within Functional Limits for tasks assessed                                          General Comments      Exercises     Assessment/Plan    PT Assessment Patient needs continued PT services  PT Problem List Decreased strength;Decreased range of motion;Decreased activity tolerance;Decreased mobility;Decreased balance;Decreased coordination;Decreased knowledge of use of DME;Decreased safety awareness;Decreased knowledge of precautions;Pain       PT Treatment Interventions DME instruction;Gait training;Functional mobility training;Therapeutic activities;Therapeutic exercise;Balance training;Neuromuscular re-education;Patient/family education     PT Goals (Current goals can be found in the Care Plan section)  Acute Rehab PT Goals Patient Stated Goal: find a safe way to return to home PT Goal Formulation: With patient Time For Goal Achievement: 06/06/22 Potential to Achieve Goals: Fair    Frequency Min 2X/week     Co-evaluation               AM-PAC PT "6 Clicks" Mobility  Outcome Measure Help needed turning from your back to your side while in a flat bed without using bedrails?: Total Help needed moving from lying on your back to sitting on the side of a flat bed without using bedrails?: Total Help needed moving to and from a bed to a chair (including a wheelchair)?: Total Help needed standing up from a chair using your arms (e.g., wheelchair or bedside chair)?: Total Help needed to walk in hospital room?: Total Help needed climbing 3-5 steps with a railing? : Total 6 Click Score: 6    End of Session     Patient left: in bed;with call bell/phone within reach;with bed alarm set Nurse Communication: Mobility status  PT Visit Diagnosis: Other abnormalities of gait and mobility (R26.89);Muscle weakness (generalized) (M62.81)    Time: 2549-8264 PT Time Calculation (min) (ACUTE ONLY): 22 min   Charges:   PT Evaluation $PT Eval Low Complexity: 1 Low        11:24 AM, 05/23/22 Etta Grandchild, PT, DPT Physical Therapist - [facility]  [phone] ([place])    Sanjuana Mruk C 05/23/2022, 11:19 AM

## 2022-05-23 NOTE — ED Notes (Signed)
Unable to stand pt for orthostatic vitals.

## 2022-05-23 NOTE — Progress Notes (Signed)
76 year old with history of DM2, CKD stage IIIa, CAD, HTN, chronic lymphedema, diabetic neuropathy, GERD, venous insufficiency of bilateral lower extremity admitted for weakness, syncope and fall without any head injury.  Upon admission UA was consistent with UTI and he was started on empiric IV Rocephin.  Seen at bedside, still feeling dizzy when getting up from sleeping position. No other complaints.  Vitals are overall stable. CTABL, NSR, Abd NT ND.    Generalized weakness secondary to urinary tract infection-currently on empiric IV Rocephin, follow-up culture data  Syncope-secondary to generalized weakness, will plan for gentle hydration at this time.  If necessary eventually we can pursue further workup including echocardiogram and carotid Dopplers.  Last echo in 2018 showed EF of 70% with grade 1 DD  History of CAD-on aspirin, statin, Coreg, daily Lasix and Aldactone which I will hold  Diabetes mellitus type 2-sliding scale and Accu-Cheks ordered. Cont Semglee.   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

## 2022-05-24 DIAGNOSIS — B9620 Unspecified Escherichia coli [E. coli] as the cause of diseases classified elsewhere: Secondary | ICD-10-CM | POA: Diagnosis present

## 2022-05-24 DIAGNOSIS — J101 Influenza due to other identified influenza virus with other respiratory manifestations: Secondary | ICD-10-CM | POA: Diagnosis present

## 2022-05-24 DIAGNOSIS — E440 Moderate protein-calorie malnutrition: Secondary | ICD-10-CM | POA: Insufficient documentation

## 2022-05-24 DIAGNOSIS — Z6841 Body Mass Index (BMI) 40.0 and over, adult: Secondary | ICD-10-CM | POA: Diagnosis not present

## 2022-05-24 DIAGNOSIS — E785 Hyperlipidemia, unspecified: Secondary | ICD-10-CM | POA: Diagnosis present

## 2022-05-24 DIAGNOSIS — I5032 Chronic diastolic (congestive) heart failure: Secondary | ICD-10-CM | POA: Diagnosis present

## 2022-05-24 DIAGNOSIS — Z20822 Contact with and (suspected) exposure to covid-19: Secondary | ICD-10-CM | POA: Diagnosis present

## 2022-05-24 DIAGNOSIS — E876 Hypokalemia: Secondary | ICD-10-CM | POA: Diagnosis present

## 2022-05-24 DIAGNOSIS — N1831 Chronic kidney disease, stage 3a: Secondary | ICD-10-CM | POA: Diagnosis present

## 2022-05-24 DIAGNOSIS — E1142 Type 2 diabetes mellitus with diabetic polyneuropathy: Secondary | ICD-10-CM | POA: Diagnosis present

## 2022-05-24 DIAGNOSIS — L89302 Pressure ulcer of unspecified buttock, stage 2: Secondary | ICD-10-CM | POA: Diagnosis present

## 2022-05-24 DIAGNOSIS — G4700 Insomnia, unspecified: Secondary | ICD-10-CM | POA: Diagnosis present

## 2022-05-24 DIAGNOSIS — J9611 Chronic respiratory failure with hypoxia: Secondary | ICD-10-CM | POA: Diagnosis present

## 2022-05-24 DIAGNOSIS — G4733 Obstructive sleep apnea (adult) (pediatric): Secondary | ICD-10-CM | POA: Diagnosis present

## 2022-05-24 DIAGNOSIS — N390 Urinary tract infection, site not specified: Secondary | ICD-10-CM | POA: Diagnosis present

## 2022-05-24 DIAGNOSIS — W19XXXA Unspecified fall, initial encounter: Secondary | ICD-10-CM | POA: Diagnosis present

## 2022-05-24 DIAGNOSIS — I83009 Varicose veins of unspecified lower extremity with ulcer of unspecified site: Secondary | ICD-10-CM | POA: Diagnosis present

## 2022-05-24 DIAGNOSIS — E11649 Type 2 diabetes mellitus with hypoglycemia without coma: Secondary | ICD-10-CM | POA: Diagnosis present

## 2022-05-24 DIAGNOSIS — R55 Syncope and collapse: Secondary | ICD-10-CM | POA: Diagnosis not present

## 2022-05-24 DIAGNOSIS — E1151 Type 2 diabetes mellitus with diabetic peripheral angiopathy without gangrene: Secondary | ICD-10-CM | POA: Diagnosis present

## 2022-05-24 DIAGNOSIS — I2510 Atherosclerotic heart disease of native coronary artery without angina pectoris: Secondary | ICD-10-CM | POA: Diagnosis present

## 2022-05-24 DIAGNOSIS — R531 Weakness: Secondary | ICD-10-CM | POA: Diagnosis not present

## 2022-05-24 DIAGNOSIS — F329 Major depressive disorder, single episode, unspecified: Secondary | ICD-10-CM | POA: Diagnosis present

## 2022-05-24 DIAGNOSIS — I130 Hypertensive heart and chronic kidney disease with heart failure and stage 1 through stage 4 chronic kidney disease, or unspecified chronic kidney disease: Secondary | ICD-10-CM | POA: Diagnosis present

## 2022-05-24 DIAGNOSIS — B952 Enterococcus as the cause of diseases classified elsewhere: Secondary | ICD-10-CM | POA: Diagnosis present

## 2022-05-24 DIAGNOSIS — E1122 Type 2 diabetes mellitus with diabetic chronic kidney disease: Secondary | ICD-10-CM | POA: Diagnosis present

## 2022-05-24 LAB — BASIC METABOLIC PANEL
Anion gap: 8 (ref 5–15)
BUN: 23 mg/dL (ref 8–23)
CO2: 27 mmol/L (ref 22–32)
Calcium: 8.7 mg/dL — ABNORMAL LOW (ref 8.9–10.3)
Chloride: 102 mmol/L (ref 98–111)
Creatinine, Ser: 1.41 mg/dL — ABNORMAL HIGH (ref 0.61–1.24)
GFR, Estimated: 51 mL/min — ABNORMAL LOW (ref >60)
Glucose, Bld: 104 mg/dL — ABNORMAL HIGH (ref 70–99)
Potassium: 3.4 mmol/L — ABNORMAL LOW (ref 3.5–5.1)
Sodium: 137 mmol/L (ref 135–145)

## 2022-05-24 LAB — GLUCOSE, CAPILLARY
Glucose-Capillary: 120 mg/dL — ABNORMAL HIGH (ref 70–99)
Glucose-Capillary: 124 mg/dL — ABNORMAL HIGH (ref 70–99)
Glucose-Capillary: 98 mg/dL (ref 70–99)
Glucose-Capillary: 105 mg/dL — ABNORMAL HIGH (ref 70–99)

## 2022-05-24 LAB — CBC
HCT: 37.9 % — ABNORMAL LOW (ref 39.0–52.0)
Hemoglobin: 12.4 g/dL — ABNORMAL LOW (ref 13.0–17.0)
MCH: 29.3 pg (ref 26.0–34.0)
MCHC: 32.7 g/dL (ref 30.0–36.0)
MCV: 89.6 fL (ref 80.0–100.0)
Platelets: 145 10*3/uL — ABNORMAL LOW (ref 150–400)
RBC: 4.23 MIL/uL (ref 4.22–5.81)
RDW: 13.6 % (ref 11.5–15.5)
WBC: 7 10*3/uL (ref 4.0–10.5)
nRBC: 0 % (ref 0.0–0.2)

## 2022-05-24 LAB — MAGNESIUM: Magnesium: 1.9 mg/dL (ref 1.7–2.4)

## 2022-05-24 MED ORDER — ENSURE ENLIVE PO LIQD
237.0000 mL | Freq: Three times a day (TID) | ORAL | Status: DC
  Administered 2022-05-25 – 2022-06-08 (×30): 237 mL via ORAL

## 2022-05-24 MED ORDER — VITAMIN C 500 MG PO TABS
500.0000 mg | ORAL_TABLET | Freq: Two times a day (BID) | ORAL | Status: DC
  Administered 2022-05-24 – 2022-06-08 (×30): 500 mg via ORAL
  Filled 2022-05-24 (×29): qty 1

## 2022-05-24 MED ORDER — ZINC SULFATE 220 (50 ZN) MG PO CAPS
220.0000 mg | ORAL_CAPSULE | Freq: Every day | ORAL | Status: AC
  Administered 2022-05-24 – 2022-06-06 (×14): 220 mg via ORAL
  Filled 2022-05-24 (×14): qty 1

## 2022-05-24 MED ORDER — POTASSIUM CHLORIDE CRYS ER 20 MEQ PO TBCR
40.0000 meq | EXTENDED_RELEASE_TABLET | Freq: Once | ORAL | Status: AC
  Administered 2022-05-24: 40 meq via ORAL
  Filled 2022-05-24: qty 2

## 2022-05-24 NOTE — Progress Notes (Signed)
CSW spoke with patient's wife about recs for SNF.  She states that pt was at [facility] and came to [facility]. She doesn't want pt to return there. CSW put in referrals.  CSW explained that, if there are no other bed offers when pt discharges, pt could go back to [facility] or home with home health.  TOC will continue to follow along for care progression.

## 2022-05-24 NOTE — Progress Notes (Addendum)
Progress Note    Adam Merritt  TOI:[id number] DOB: 24-Jul-1945  DOA: 05/22/2022 PCP: [name], MD      Brief Narrative:    Medical records reviewed and are as summarized below:  Adam Merritt is a 76 y.o. male  with medical history significant for  diabetes mellitus with complications of stage IIIa chronic kidney disease, coronary artery disease, chronic diastolic CHF, history of stroke, hypertension, chronic lymphedema, diabetic neuropathy, GERD, history of venous insufficiency with bilateral lower extremity ulcerations.  He presented to the hospital because of generalized weakness, syncope and fall without head injury.  He also complained of urinary frequency and dysuria.     Assessment/Plan:   Principal Problem:   Syncope Active Problems:   Acute lower UTI   Dyslipidemia   Type 2 diabetes mellitus with peripheral neuropathy (HCC)   Fall   Weakness    Body mass index is 48.09 kg/m.  (Morbid obesity)  Acute UTI: Continue IV Rocephin.  Follow-up urine culture.  Check postvoid residual.  S/p syncope: Probably from general weakness  Generalized weakness: PT and OT recommended discharge to SNF.  Follow-up with social worker to assist with disposition  Hypokalemia: Replete potassium and monitor levels  Other comorbidities include CAD, type II DM, CKD stage IIIa, peripheral neuropathy, hyperlipidemia, chronic lymphedema lower extremities, history of stroke, chronic diastolic CHF    Diet Order             Diet Heart Room service appropriate? Yes; Fluid consistency: Thin  Diet effective now                            Consultants: None  Procedures: None    Medications:    ascorbic acid  500 mg Oral Daily   aspirin EC  325 mg Oral Daily   atorvastatin  40 mg Oral QHS   carvedilol  25 mg Oral BID WC   vitamin D3  1,000 Units Oral Daily   cyanocobalamin  1,000 mcg Oral Daily   docusate  50 mg Oral BID   enoxaparin (LOVENOX)  injection  0.5 mg/kg Subcutaneous I33A   folic acid  1 mg Oral Daily   gabapentin  300 mg Oral BID   insulin aspart  0-9 Units Subcutaneous TID WC   insulin glargine-yfgn  30 Units Subcutaneous Daily   iron polysaccharides  150 mg Oral Daily   multivitamin with minerals  1 tablet Oral Daily   pantoprazole  40 mg Oral Daily   pentoxifylline  400 mg Oral TID WC   potassium chloride  40 mEq Oral Once   prazosin  1 mg Oral QHS   sertraline  100 mg Oral Daily   traZODone  75 mg Oral QHS   Continuous Infusions:  cefTRIAXone (ROCEPHIN)  IV 2 g (05/24/22 0214)     Anti-infectives (From admission, onward)    Start     Dose/Rate Route Frequency Ordered Stop   05/24/22 0200  cefTRIAXone (ROCEPHIN) 2 g in sodium chloride 0.9 % 100 mL IVPB        2 g 200 mL/hr over 30 Minutes Intravenous Every 24 hours 05/23/22 0217     05/23/22 0230  cefTRIAXone (ROCEPHIN) 1 g in sodium chloride 0.9 % 100 mL IVPB        1 g 200 mL/hr over 30 Minutes Intravenous  Once 05/23/22 0229 05/23/22 0448   05/23/22 0130  cefTRIAXone (ROCEPHIN) 1 g  in sodium chloride 0.9 % 100 mL IVPB        1 g 200 mL/hr over 30 Minutes Intravenous  Once 05/23/22 0122 05/23/22 0214              Family Communication/Anticipated D/C date and plan/Code Status   DVT prophylaxis:      Code Status: Full Code  Family Communication: None Disposition Plan: SNF at discharge   Status is: Observation The patient will require care spanning > 2 midnights and should be moved to inpatient because: Generalized weakness, on IV antibiotics        Subjective:   Interval events noted.  He complains of generalized weakness.  Objective:    Vitals:   05/23/22 2136 05/23/22 2309 05/24/22 0417 05/24/22 0804  BP: (!) 112/49 (!) 95/36 (!) 99/54 115/77  Pulse: 65 63 68 75  Resp: '18 18 18 20  '$ Temp: 98.4 F (36.9 C) 98.2 F (36.8 C) 98.3 F (36.8 C) 99.2 F (37.3 C)  TempSrc:  Oral Oral   SpO2: 97% 94% 94% 99%  Weight:       Height:       No data found.  No intake or output data in the 24 hours ending 05/24/22 1155 Filed Weights   05/22/22 1913  Weight: 131.1 kg    Exam:  GEN: NAD SKIN: Warm and dry.   EYES: EOMI ENT: MMM CV: RRR PULM: CTA B ABD: soft, obese, NT, +BS CNS: AAO x 3, non focal, tremors of bilateral forearms EXT: Chronic lymphedema of bilateral legs with lichenification.     Pressure Injury 05/23/22 Buttocks Mid Stage 2 -  Partial thickness loss of dermis presenting as a shallow open injury with a red, pink wound bed without slough. (Active)  05/23/22 2000  Location: Buttocks  Location Orientation: Mid  Staging: Stage 2 -  Partial thickness loss of dermis presenting as a shallow open injury with a red, pink wound bed without slough.  Wound Description (Comments):   Present on Admission: Yes  Dressing Type Foam - Lift dressing to assess site every shift 05/24/22 1004     Data Reviewed:   I have personally reviewed following labs and imaging studies:  Labs: Labs show the following:   Basic Metabolic Panel: Recent Labs  Lab 05/22/22 1917 05/23/22 0450 05/24/22 0356  NA 135 137 137  K 3.5 4.3 3.4*  CL 100 103 102  CO2 '27 26 27  '$ GLUCOSE 137* 122* 104*  BUN 23 24* 23  CREATININE 1.43* 1.41* 1.41*  CALCIUM 8.8* 8.7* 8.7*  MG  --   --  1.9   GFR Estimated Creatinine Clearance: 55.4 mL/min (A) (by C-G formula based on SCr of 1.41 mg/dL (H)). Liver Function Tests: Recent Labs  Lab 05/22/22 1917  AST 48*  ALT 46*  ALKPHOS 83  BILITOT 0.7  PROT 7.0  ALBUMIN 2.9*   No results for input(s): "LIPASE", "AMYLASE" in the last 168 hours. Recent Labs  Lab 05/22/22 1917  AMMONIA <10   Coagulation profile No results for input(s): "INR", "PROTIME" in the last 168 hours.  CBC: Recent Labs  Lab 05/22/22 1917 05/23/22 0450 05/24/22 0356  WBC 10.8* 9.6 7.0  NEUTROABS 8.9*  --   --   HGB 13.1 12.4* 12.4*  HCT 41.6 38.8* 37.9*  MCV 92.4 90.7 89.6  PLT 152 151  145*   Cardiac Enzymes: No results for input(s): "CKTOTAL", "CKMB", "CKMBINDEX", "TROPONINI" in the last 168 hours. BNP (last 3 results) No  results for input(s): "PROBNP" in the last 8760 hours. CBG: Recent Labs  Lab 05/23/22 1022 05/23/22 1728 05/23/22 2136 05/24/22 0803  GLUCAP 142* 119* 120* 120*   D-Dimer: No results for input(s): "DDIMER" in the last 72 hours. Hgb A1c: No results for input(s): "HGBA1C" in the last 72 hours. Lipid Profile: No results for input(s): "CHOL", "HDL", "LDLCALC", "TRIG", "CHOLHDL", "LDLDIRECT" in the last 72 hours. Thyroid function studies: No results for input(s): "TSH", "T4TOTAL", "T3FREE", "THYROIDAB" in the last 72 hours.  Invalid input(s): "FREET3" Anemia work up: No results for input(s): "VITAMINB12", "FOLATE", "FERRITIN", "TIBC", "IRON", "RETICCTPCT" in the last 72 hours. Sepsis Labs: Recent Labs  Lab 05/22/22 1917 05/23/22 0450 05/24/22 0356  WBC 10.8* 9.6 7.0    Microbiology Recent Results (from the past 240 hour(s))  Resp panel by RT-PCR (RSV, Flu A&B, Covid) Anterior Nasal Swab     Status: None   Collection Time: 05/22/22  7:17 PM   Specimen: Anterior Nasal Swab  Result Value Ref Range Status   SARS Coronavirus 2 by RT PCR NEGATIVE NEGATIVE Final    Comment: (NOTE) SARS-CoV-2 target nucleic acids are NOT DETECTED.  The SARS-CoV-2 RNA is generally detectable in upper respiratory specimens during the acute phase of infection. The lowest concentration of SARS-CoV-2 viral copies this assay can detect is 138 copies/mL. A negative result does not preclude SARS-Cov-2 infection and should not be used as the sole basis for treatment or other patient management decisions. A negative result may occur with  improper specimen collection/handling, submission of specimen other than nasopharyngeal swab, presence of viral mutation(s) within the areas targeted by this assay, and inadequate number of viral copies(<138 copies/mL). A  negative result must be combined with clinical observations, patient history, and epidemiological information. The expected result is Negative.  Fact Sheet for Patients:  EntrepreneurPulse.com.au  Fact Sheet for Healthcare Providers:  IncredibleEmployment.be  This test is no t yet approved or cleared by the [place] FDA and  has been authorized for detection and/or diagnosis of SARS-CoV-2 by FDA under an Emergency Use Authorization (EUA). This EUA will remain  in effect (meaning this test can be used) for the duration of the COVID-19 declaration under Section 564(b)(1) of the Act, 21 U.S.C.section 360bbb-3(b)(1), unless the authorization is terminated  or revoked sooner.       Influenza A by PCR NEGATIVE NEGATIVE Final   Influenza B by PCR NEGATIVE NEGATIVE Final    Comment: (NOTE) The Xpert Xpress SARS-CoV-2/FLU/RSV plus assay is intended as an aid in the diagnosis of influenza from Nasopharyngeal swab specimens and should not be used as a sole basis for treatment. Nasal washings and aspirates are unacceptable for Xpert Xpress SARS-CoV-2/FLU/RSV testing.  Fact Sheet for Patients: EntrepreneurPulse.com.au  Fact Sheet for Healthcare Providers: IncredibleEmployment.be  This test is not yet approved or cleared by the [place] FDA and has been authorized for detection and/or diagnosis of SARS-CoV-2 by FDA under an Emergency Use Authorization (EUA). This EUA will remain in effect (meaning this test can be used) for the duration of the COVID-19 declaration under Section 564(b)(1) of the Act, 21 U.S.C. section 360bbb-3(b)(1), unless the authorization is terminated or revoked.     Resp Syncytial Virus by PCR NEGATIVE NEGATIVE Final    Comment: (NOTE) Fact Sheet for Patients: EntrepreneurPulse.com.au  Fact Sheet for Healthcare  Providers: IncredibleEmployment.be  This test is not yet approved or cleared by the [place] FDA and has been authorized for detection and/or diagnosis of SARS-CoV-2 by FDA  under an Emergency Use Authorization (EUA). This EUA will remain in effect (meaning this test can be used) for the duration of the COVID-19 declaration under Section 564(b)(1) of the Act, 21 U.S.C. section 360bbb-3(b)(1), unless the authorization is terminated or revoked.  Performed at [facility], [address] [place], [place] [address]   Urine Culture     Status: None (Preliminary result)   Collection Time: 05/22/22  7:17 PM   Specimen: Urine, Random  Result Value Ref Range Status   Specimen Description   Final    URINE, RANDOM Performed at [facility], [address], [place], [place] [address]    Special Requests   Final    NONE Performed at [facility], [address], [place], [place] [address]    Culture   Final    CULTURE REINCUBATED FOR BETTER GROWTH Performed at [facility], [place] [address] [place], [place] [address]    Report Status PENDING  Incomplete    Procedures and diagnostic studies:  DG Chest 1 View  Result Date: 05/22/2022 CLINICAL DATA:  Fall, weakness EXAM: CHEST  1 VIEW COMPARISON:  04/27/2022 FINDINGS: Lungs are clear.  No pleural effusion or pneumothorax. The heart is normal in size. Postsurgical changes related to prior CABG. Median sternotomy. IMPRESSION: No evidence of acute cardiopulmonary disease. Electronically Signed   By: Julian Hy M.D.   On: 05/22/2022 20:00   DG Knee Complete 4 Views Right  Result Date: 05/22/2022 CLINICAL DATA:  Fall, right knee pain EXAM: RIGHT KNEE - COMPLETE 4+ VIEW COMPARISON:  04/29/2022 FINDINGS: No fracture or dislocation is seen. The joint spaces are preserved. Visualized soft tissues are within normal limits. Small suprapatellar knee joint effusion. IMPRESSION: No  fracture or dislocation is seen. Small suprapatellar knee joint effusion. Electronically Signed   By: Julian Hy M.D.   On: 05/22/2022 19:59               LOS: 0 days   Geral Coker  [facility]   Pager on [email or web address]. If 7PM-7AM, please contact night-coverage at [email or web address]     05/24/2022, 11:55 AM

## 2022-05-24 NOTE — Consult Note (Signed)
WOC Nurse Consult Note: Lymphedema, wears Unna boots applied weekly at the wound care center.  Due to fall, wraps are overdue to be changed and patient states his legs are aching.  Reason for Consult: Lymphedema to lower legs, compression application.  Wound type: Lichenification present to lower legs and dorsal feet.  Dry skin throughout.  Legs are cleansed with soap and water and moisturized.  Patient would like to leave them open to air for today due to pain and them being on for a prolonged period of time. HIs last noted wound care center appointment is 04/25/22  He is not sure how long the wraps have been in place but they are disheveled in appearance.  Pressure Injury POA: NA Measurement: no open wounds Wound bed: cracked skin with pink moist areas noted beneath Drainage (amount, consistency, odor) None noted Periwound: Dry skin Dressing procedure/placement/frequency:LEgs are cleansed and moisturized.  Tomorrow we will reapply Unna boots to be changed at the wound care center weekly. THe supplies are in the room as I gathered them before patient refused application today.  Will follow and apply Unna boots as agreed tomorrow (Thursday)  Estrellita Ludwig MSN, RN, FNP-BC CWON Wound, Ostomy, Continence Nurse [facility] [phone] Pager [phone]

## 2022-05-24 NOTE — Progress Notes (Signed)
Initial Nutrition Assessment  DOCUMENTATION CODES:   Morbid obesity, Non-severe (moderate) malnutrition in context of chronic illness  INTERVENTION:   -Ensure Enlive po TID, each supplement provides 350 kcal and 20 grams of protein.  -Magic cup TID with meals, each supplement provides 290 kcal and 9 grams of protein  -MVI with minerals daily -500 mg vitamin C BID -220 mg zinc sulfate daily -Feeding assistance with meals -Liberalize diet to 2 gram sodium for wider variety of meal selections  NUTRITION DIAGNOSIS:   Moderate Malnutrition related to chronic illness (CAD, lymphadema) as evidenced by mild fat depletion, mild muscle depletion.  GOAL:   Patient will meet greater than or equal to 90% of their needs  MONITOR:   Supplement acceptance, PO intake  REASON FOR ASSESSMENT:   Malnutrition Screening Tool    ASSESSMENT:   Pt with medical history significant for  diabetes mellitus with complications of stage IIIa chronic kidney disease, coronary artery disease, hypertension, chronic lymphedema, diabetic neuropathy, GERD, history of venous insufficiency with bilateral lower extremity ulcerations who presents for evaluation of generalized weakness with subsequent syncope and fall without head injury.  Pt admitted with syncope, generalized weakness, UTI, and lymphedema.   Reviewed I/O's: -736 ml x 24 hours and -636 ml since admisison  UOP: 836 ml since admission   Per CWOCN notes, pt with lymphedema to lower extremities.   Spoke with pt at bedside. Pt shares that he is having "a bad day" explaining that he is so weak that he is unable to stand or walk. Per pt, he "quit eating a few months ago" and lost a significant amount of weight. He shares he mainly eats fruits and vegetables and has limited meat in his diet. Pt shares that he has dentures, but does not have access to them here, but denies difficulty chewing or swallowing foods. Pt shares that he can chew meat, but he  just does not prefer to eat it. Pt also with hand tremors and reports he usually needs assistance with meals.   Reviewed wt hx; pt has experienced a 3.7% wt loss over the past 2 months, which is not significant for time frame. Pt estimates he has lost about 30-40# within the past few months. Per pt, his UBW is around 295# and has lost down to around 260-270#. Suspect edema may be masking further weight loss as well as fat and muscle depletions.   Discussed importance of good meal and supplement intake to promote healing. Pt amenable to supplements.   Per TOC notes, plan SNF vs home with home health services at discharge.   Medications reviewed and include vitamin C, vitamin B-12, colace, and folvite.   Lab Results  Component Value Date   HGBA1C 6.8 (H) 04/28/2022   PTA DM medications are 1.8 mg victoza weekly.   Labs reviewed: K: 3.4, CBGS: 98-124 (inpatient orders for glycemic control are 0-9 units inuslin aspart TID with meals and 30 units insulin glargine-yfgn daily).    NUTRITION - FOCUSED PHYSICAL EXAM:  Flowsheet Row Most Recent Value  Orbital Region Mild depletion  Upper Arm Region Moderate depletion  Thoracic and Lumbar Region No depletion  Buccal Region Mild depletion  Temple Region Mild depletion  Clavicle Bone Region Mild depletion  Clavicle and Acromion Bone Region Mild depletion  Scapular Bone Region Mild depletion  Dorsal Hand Mild depletion  Patellar Region Mild depletion  Anterior Thigh Region Mild depletion  Posterior Calf Region Mild depletion  Edema (RD Assessment) Moderate  Hair Reviewed  Eyes Reviewed  Mouth Reviewed  Skin Reviewed  Nails Reviewed       Diet Order:   Diet Order             Diet 2 gram sodium Fluid consistency: Thin  Diet effective now                   EDUCATION NEEDS:   Education needs have been addressed  Skin:  Skin Assessment: Skin Integrity Issues: Skin Integrity Issues:: Stage II Stage II: buttocks  Last BM:   05/23/22 (type 6)  Height:   Ht Readings from Last 1 Encounters:  05/22/22 '5\' 5"'$  (1.651 m)    Weight:   Wt Readings from Last 1 Encounters:  05/22/22 131.1 kg    Ideal Body Weight:  61.8 kg  BMI:  Body mass index is 48.09 kg/m.  Estimated Nutritional Needs:   Kcal:  1950-2150  Protein:  105-120 grams  Fluid:  > 1.9 L    Loistine Chance, RD, LDN, [place] Registered Dietitian II Certified Diabetes Care and Education Specialist Please refer to [facility] for RD and/or RD on-call/weekend/after hours pager

## 2022-05-25 ENCOUNTER — Inpatient Hospital Stay: Payer: Medicare PPO

## 2022-05-25 DIAGNOSIS — E1142 Type 2 diabetes mellitus with diabetic polyneuropathy: Secondary | ICD-10-CM | POA: Diagnosis not present

## 2022-05-25 DIAGNOSIS — R55 Syncope and collapse: Secondary | ICD-10-CM | POA: Diagnosis not present

## 2022-05-25 DIAGNOSIS — N390 Urinary tract infection, site not specified: Secondary | ICD-10-CM | POA: Diagnosis not present

## 2022-05-25 DIAGNOSIS — R531 Weakness: Secondary | ICD-10-CM | POA: Diagnosis not present

## 2022-05-25 LAB — BASIC METABOLIC PANEL
Anion gap: 8 (ref 5–15)
BUN: 21 mg/dL (ref 8–23)
CO2: 26 mmol/L (ref 22–32)
Calcium: 8.6 mg/dL — ABNORMAL LOW (ref 8.9–10.3)
Chloride: 105 mmol/L (ref 98–111)
Creatinine, Ser: 1.36 mg/dL — ABNORMAL HIGH (ref 0.61–1.24)
GFR, Estimated: 54 mL/min — ABNORMAL LOW (ref >60)
Glucose, Bld: 81 mg/dL (ref 70–99)
Potassium: 3.7 mmol/L (ref 3.5–5.1)
Sodium: 139 mmol/L (ref 135–145)

## 2022-05-25 LAB — URINE CULTURE: Culture: >=100000 — AB

## 2022-05-25 LAB — GLUCOSE, CAPILLARY
Glucose-Capillary: 85 mg/dL (ref 70–99)
Glucose-Capillary: 143 mg/dL — ABNORMAL HIGH (ref 70–99)
Glucose-Capillary: 103 mg/dL — ABNORMAL HIGH (ref 70–99)

## 2022-05-25 LAB — MAGNESIUM: Magnesium: 2.2 mg/dL (ref 1.7–2.4)

## 2022-05-25 MED ORDER — NITROFURANTOIN MONOHYD MACRO 100 MG PO CAPS
100.0000 mg | ORAL_CAPSULE | Freq: Two times a day (BID) | ORAL | Status: AC
  Administered 2022-05-25 – 2022-05-31 (×14): 100 mg via ORAL
  Filled 2022-05-25 (×14): qty 1

## 2022-05-25 MED ORDER — OXYCODONE HCL 5 MG PO TABS
5.0000 mg | ORAL_TABLET | Freq: Three times a day (TID) | ORAL | Status: AC | PRN
  Administered 2022-05-25: 5 mg via ORAL
  Filled 2022-05-25: qty 1

## 2022-05-25 NOTE — Progress Notes (Signed)
Progress Note    MATTI Merritt  FAO:[id number] DOB: [birth year]  DOA: 05/22/2022 PCP: [name], MD      Brief Narrative:    Medical records reviewed and are as summarized below:  Adam Merritt is a 76 y.o. male  with medical history significant for  diabetes mellitus with complications of stage IIIa chronic kidney disease, coronary artery disease, chronic diastolic CHF, history of stroke, hypertension, chronic lymphedema, diabetic neuropathy, GERD, history of venous insufficiency with bilateral lower extremity ulcerations.  He presented to the hospital because of generalized weakness, syncope and fall without head injury.  He also complained of urinary frequency and dysuria.     Assessment/Plan:   Principal Problem:   Syncope Active Problems:   Acute lower UTI   Dyslipidemia   Type 2 diabetes mellitus with peripheral neuropathy (HCC)   Fall   Weakness   Malnutrition of moderate degree    Body mass index is 48.09 kg/m.  (Morbid obesity)  Acute UTI: Urine culture showed Enterococcus faecalis and E. coli.  Discontinue IV ceftriaxone and start nitrofurantoin for 7 days.  Renal ultrasound did not show any hydronephrosis, nephrolithiasis.  Post void residual urine on 05/24/2022  was 136 mls   S/p syncope: Probably from general weakness   Generalized weakness: PT and OT recommended discharge to SNF.  Follow-up with social worker to assist with disposition   Hypokalemia: Improved.   Tremors of bilateral upper extremities: his wife said he was supposed to see a neurologist at [place] in November, 2023. Follow up with neurologist as an outpatient  Other comorbidities include CAD, type II DM, CKD stage IIIa, peripheral neuropathy, hyperlipidemia, chronic lymphedema lower extremities, history of stroke, chronic diastolic CHF    Diet Order             Diet 2 gram sodium Fluid consistency: Thin  Diet effective now                             Consultants: None  Procedures: None    Medications:    ascorbic acid  500 mg Oral BID   aspirin EC  325 mg Oral Daily   atorvastatin  40 mg Oral QHS   carvedilol  25 mg Oral BID WC   vitamin D3  1,000 Units Oral Daily   cyanocobalamin  1,000 mcg Oral Daily   docusate  50 mg Oral BID   enoxaparin (LOVENOX) injection  0.5 mg/kg Subcutaneous Q24H   feeding supplement  237 mL Oral TID BM   folic acid  1 mg Oral Daily   gabapentin  300 mg Oral BID   insulin aspart  0-9 Units Subcutaneous TID WC   insulin glargine-yfgn  30 Units Subcutaneous Daily   iron polysaccharides  150 mg Oral Daily   multivitamin with minerals  1 tablet Oral Daily   nitrofurantoin (macrocrystal-monohydrate)  100 mg Oral Q12H   pantoprazole  40 mg Oral Daily   pentoxifylline  400 mg Oral TID WC   prazosin  1 mg Oral QHS   sertraline  100 mg Oral Daily   traZODone  75 mg Oral QHS   zinc sulfate  220 mg Oral Daily   Continuous Infusions:     Anti-infectives (From admission, onward)    Start     Dose/Rate Route Frequency Ordered Stop   05/25/22 1500  nitrofurantoin (macrocrystal-monohydrate) (MACROBID) capsule 100 mg  100 mg Oral Every 12 hours 05/25/22 1412 06/01/22 0959   05/24/22 0200  cefTRIAXone (ROCEPHIN) 2 g in sodium chloride 0.9 % 100 mL IVPB  Status:  Discontinued        2 g 200 mL/hr over 30 Minutes Intravenous Every 24 hours 05/23/22 0217 05/25/22 1412   05/23/22 0230  cefTRIAXone (ROCEPHIN) 1 g in sodium chloride 0.9 % 100 mL IVPB        1 g 200 mL/hr over 30 Minutes Intravenous  Once 05/23/22 0229 05/23/22 0448   05/23/22 0130  cefTRIAXone (ROCEPHIN) 1 g in sodium chloride 0.9 % 100 mL IVPB        1 g 200 mL/hr over 30 Minutes Intravenous  Once 05/23/22 0122 05/23/22 0214              Family Communication/Anticipated D/C date and plan/Code Status   DVT prophylaxis:      Code Status: Full Code  Family Communication: Plan discussed with his  wife at the bedside Disposition Plan: SNF at discharge   Status is: Inpatient Remains inpatient appropriate because: Needs SNF at discharge          Subjective:   Interval events noted.  He still feels weak and tired.  His wife was at the bedside.  Katie, OT, was at the bedside  Objective:    Vitals:   05/24/22 1601 05/24/22 2057 05/25/22 0531 05/25/22 0730  BP: (!) 106/59 (!) 137/59 (!) 130/59 (!) 117/46  Pulse: 66 64 67 67  Resp: '18 18 18 18  '$ Temp: 98 F (36.7 C) (!) 97.5 F (36.4 C) 97.9 F (36.6 C) 98.9 F (37.2 C)  TempSrc:  Oral Oral Axillary  SpO2: 98% 100% (!) 87% 99%  Weight:      Height:       No data found.   Intake/Output Summary (Last 24 hours) at 05/25/2022 1435 Last data filed at 05/25/2022 0553 Gross per 24 hour  Intake 100 ml  Output 1200 ml  Net -1100 ml   Filed Weights   05/22/22 1913  Weight: 131.1 kg    Exam:   GEN: NAD SKIN: Warm and dry EYES: Anicteric ENT: MMM CV: RRR PULM: CTA B ABD: soft, obese, NT, +BS CNS: AAO x 3, non focal, slurred speech at baseline, tremors of bilateral upper extremities EXT: Lymphedema of bilateral legs, no tenderness     Pressure Injury 05/23/22 Buttocks Mid Stage 2 -  Partial thickness loss of dermis presenting as a shallow open injury with a red, pink wound bed without slough. (Active)  05/23/22 2000  Location: Buttocks  Location Orientation: Mid  Staging: Stage 2 -  Partial thickness loss of dermis presenting as a shallow open injury with a red, pink wound bed without slough.  Wound Description (Comments):   Present on Admission: Yes  Dressing Type Foam - Lift dressing to assess site every shift 05/24/22 2200     Data Reviewed:   I have personally reviewed following labs and imaging studies:  Labs: Labs show the following:   Basic Metabolic Panel: Recent Labs  Lab 05/22/22 1917 05/23/22 0450 05/24/22 0356 05/25/22 0402  NA 135 137 137 139  K 3.5 4.3 3.4* 3.7  CL 100 103  102 105  CO2 '27 26 27 26  '$ GLUCOSE 137* 122* 104* 81  BUN 23 24* 23 21  CREATININE 1.43* 1.41* 1.41* 1.36*  CALCIUM 8.8* 8.7* 8.7* 8.6*  MG  --   --  1.9 2.2  GFR Estimated Creatinine Clearance: 57.5 mL/min (A) (by C-G formula based on SCr of 1.36 mg/dL (H)). Liver Function Tests: Recent Labs  Lab 05/22/22 1917  AST 48*  ALT 46*  ALKPHOS 83  BILITOT 0.7  PROT 7.0  ALBUMIN 2.9*   No results for input(s): "LIPASE", "AMYLASE" in the last 168 hours. Recent Labs  Lab 05/22/22 1917  AMMONIA <10   Coagulation profile No results for input(s): "INR", "PROTIME" in the last 168 hours.  CBC: Recent Labs  Lab 05/22/22 1917 05/23/22 0450 05/24/22 0356  WBC 10.8* 9.6 7.0  NEUTROABS 8.9*  --   --   HGB 13.1 12.4* 12.4*  HCT 41.6 38.8* 37.9*  MCV 92.4 90.7 89.6  PLT 152 151 145*   Cardiac Enzymes: No results for input(s): "CKTOTAL", "CKMB", "CKMBINDEX", "TROPONINI" in the last 168 hours. BNP (last 3 results) No results for input(s): "PROBNP" in the last 8760 hours. CBG: Recent Labs  Lab 05/24/22 1154 05/24/22 1618 05/24/22 2101 05/25/22 0728 05/25/22 1211  GLUCAP 124* 98 105* 85 143*   D-Dimer: No results for input(s): "DDIMER" in the last 72 hours. Hgb A1c: No results for input(s): "HGBA1C" in the last 72 hours. Lipid Profile: No results for input(s): "CHOL", "HDL", "LDLCALC", "TRIG", "CHOLHDL", "LDLDIRECT" in the last 72 hours. Thyroid function studies: No results for input(s): "TSH", "T4TOTAL", "T3FREE", "THYROIDAB" in the last 72 hours.  Invalid input(s): "FREET3" Anemia work up: No results for input(s): "VITAMINB12", "FOLATE", "FERRITIN", "TIBC", "IRON", "RETICCTPCT" in the last 72 hours. Sepsis Labs: Recent Labs  Lab 05/22/22 1917 05/23/22 0450 05/24/22 0356  WBC 10.8* 9.6 7.0    Microbiology Recent Results (from the past 240 hour(s))  Resp panel by RT-PCR (RSV, Flu A&B, Covid) Anterior Nasal Swab     Status: None   Collection Time: 05/22/22  7:17  PM   Specimen: Anterior Nasal Swab  Result Value Ref Range Status   SARS Coronavirus 2 by RT PCR NEGATIVE NEGATIVE Final    Comment: (NOTE) SARS-CoV-2 target nucleic acids are NOT DETECTED.  The SARS-CoV-2 RNA is generally detectable in upper respiratory specimens during the acute phase of infection. The lowest concentration of SARS-CoV-2 viral copies this assay can detect is 138 copies/mL. A negative result does not preclude SARS-Cov-2 infection and should not be used as the sole basis for treatment or other patient management decisions. A negative result may occur with  improper specimen collection/handling, submission of specimen other than nasopharyngeal swab, presence of viral mutation(s) within the areas targeted by this assay, and inadequate number of viral copies(<138 copies/mL). A negative result must be combined with clinical observations, patient history, and epidemiological information. The expected result is Negative.  Fact Sheet for Patients:  EntrepreneurPulse.com.au  Fact Sheet for Healthcare Providers:  IncredibleEmployment.be  This test is no t yet approved or cleared by the [place] FDA and  has been authorized for detection and/or diagnosis of SARS-CoV-2 by FDA under an Emergency Use Authorization (EUA). This EUA will remain  in effect (meaning this test can be used) for the duration of the COVID-19 declaration under Section 564(b)(1) of the Act, 21 U.S.C.section 360bbb-3(b)(1), unless the authorization is terminated  or revoked sooner.       Influenza A by PCR NEGATIVE NEGATIVE Final   Influenza B by PCR NEGATIVE NEGATIVE Final    Comment: (NOTE) The Xpert Xpress SARS-CoV-2/FLU/RSV plus assay is intended as an aid in the diagnosis of influenza from Nasopharyngeal swab specimens and should not be used as a  sole basis for treatment. Nasal washings and aspirates are unacceptable for Xpert Xpress  SARS-CoV-2/FLU/RSV testing.  Fact Sheet for Patients: EntrepreneurPulse.com.au  Fact Sheet for Healthcare Providers: IncredibleEmployment.be  This test is not yet approved or cleared by the [place] FDA and has been authorized for detection and/or diagnosis of SARS-CoV-2 by FDA under an Emergency Use Authorization (EUA). This EUA will remain in effect (meaning this test can be used) for the duration of the COVID-19 declaration under Section 564(b)(1) of the Act, 21 U.S.C. section 360bbb-3(b)(1), unless the authorization is terminated or revoked.     Resp Syncytial Virus by PCR NEGATIVE NEGATIVE Final    Comment: (NOTE) Fact Sheet for Patients: EntrepreneurPulse.com.au  Fact Sheet for Healthcare Providers: IncredibleEmployment.be  This test is not yet approved or cleared by the [place] FDA and has been authorized for detection and/or diagnosis of SARS-CoV-2 by FDA under an Emergency Use Authorization (EUA). This EUA will remain in effect (meaning this test can be used) for the duration of the COVID-19 declaration under Section 564(b)(1) of the Act, 21 U.S.C. section 360bbb-3(b)(1), unless the authorization is terminated or revoked.  Performed at [facility], [address] [place], [place] [address]   Urine Culture     Status: Abnormal   Collection Time: 05/22/22  7:17 PM   Specimen: Urine, Random  Result Value Ref Range Status   Specimen Description   Final    URINE, RANDOM Performed at [facility], [address] [place], [place] [address]    Special Requests   Final    NONE Performed at [facility], [place], [place] [address]    Culture (A)  Final    >=100,000 COLONIES/mL ENTEROCOCCUS FAECALIS 10,000 COLONIES/mL ESCHERICHIA COLI    Report Status 05/27/2022 FINAL  Final   Organism ID, Bacteria ENTEROCOCCUS FAECALIS (A)  Final    Organism ID, Bacteria ESCHERICHIA COLI (A)  Final      Susceptibility   Escherichia coli - MIC*    AMPICILLIN >=32 RESISTANT Resistant     CEFAZOLIN <=4 SENSITIVE Sensitive     CEFEPIME <=0.12 SENSITIVE Sensitive     CEFTRIAXONE <=0.25 SENSITIVE Sensitive     CIPROFLOXACIN >=4 RESISTANT Resistant     GENTAMICIN <=1 SENSITIVE Sensitive     IMIPENEM <=0.25 SENSITIVE Sensitive     NITROFURANTOIN <=16 SENSITIVE Sensitive     TRIMETH/SULFA >=320 RESISTANT Resistant     AMPICILLIN/SULBACTAM 16 INTERMEDIATE Intermediate     PIP/TAZO <=4 SENSITIVE Sensitive     * 10,000 COLONIES/mL ESCHERICHIA COLI   Enterococcus faecalis - MIC*    AMPICILLIN <=2 SENSITIVE Sensitive     NITROFURANTOIN <=16 SENSITIVE Sensitive     VANCOMYCIN 1 SENSITIVE Sensitive     * >=100,000 COLONIES/mL ENTEROCOCCUS FAECALIS    Procedures and diagnostic studies:  US RENAL  Result Date: 05/27/22 CLINICAL DATA:  Urinary tract infection, history CHF, diabetes mellitus, hypertension EXAM: RENAL / URINARY TRACT ULTRASOUND COMPLETE COMPARISON:  04/17/2018 FINDINGS: Right Kidney: Renal measurements: 9.8 x 5.4 x 4.7 cm = volume: 131 mL. Cortical thinning. Normal cortical echogenicity. Slightly lobulated contour. No mass or hydronephrosis. No shadowing calculi. Left Kidney: Renal measurements: 11.4 x 4.7 x 6.2 cm = volume: 208 ML. Cortical thinning. Normal cortical echogenicity. No mass, hydronephrosis, or shadowing calcification. Bladder: Appears normal for degree of bladder distention. Other: Exam quality degraded secondary to body habitus. IMPRESSION: No evidence of renal mass or hydronephrosis. Electronically Signed   By: Lavonia Dana  M.D.   On: 05/25/2022 10:25               LOS: 1 day   Mallie Giambra  [facility]   Pager on [email or web address]. If 7PM-7AM, please contact night-coverage at [email or web address]     05/25/2022, 2:35 PM

## 2022-05-25 NOTE — Progress Notes (Signed)
Physical Therapy Treatment Patient Details Name: Adam Merritt MRN: [id number] DOB: 09-06-1945 Today's Date: 05/25/2022   History of Present Illness Alano Blasco is a 41yoM who comes to [facility] 12/18 after a fall. PMH includes: DM2, CKD 3a, CAD, HTN, lymphedema, diabetic neuropathy, GERD, venous insufficiency with BLE ulceratons. 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 He has more recent BUE tremor in past few months. MD assessment includes: acute UTI, syncope, generalized weakness, and hypokalemia.    PT Comments    Pt found in supine and agreeable to participation with PT services. Supine BP 115/55 with HR 62 bpm.  Pt required heavy +2 assist to come to sitting with BP 95/51 with HR 64 bpm and with min reports of dizziness that resolved after sitting grossly 30 sec.  Pt participated in sitting and supine therex with fair effort with frequent min LOB in various planes while in sitting to prevent LOB.  Pt declined to attempt to stand secondary weakness.  Pt will benefit from PT services in a SNF setting upon discharge to safely address deficits listed in patient problem list for decreased caregiver assistance and eventual return to PLOF.   Recommendations for follow up therapy are one component of a multi-disciplinary discharge planning process, led by the attending physician.  Recommendations may be updated based on patient status, additional functional criteria and insurance authorization.  Follow Up Recommendations  Skilled nursing-short term rehab (<3 hours/day) Can patient physically be transported by private vehicle: No   Assistance Recommended at Discharge Frequent or constant Supervision/Assistance  Patient can return home with the following Two people to help with walking and/or transfers;Two people to help with bathing/dressing/bathroom;Assistance with cooking/housework;Assistance with feeding;Direct supervision/assist for medications management;Assist for transportation;Help with stairs or  ramp for entrance   Equipment Recommendations  Other (comment) (TBD at next venue of care)    Recommendations for Other Services       Precautions / Restrictions Precautions Precautions: Fall Precaution Comments: Una boots bilaterally Restrictions Weight Bearing Restrictions: No     Mobility  Bed Mobility Overal bed mobility: Needs Assistance Bed Mobility: Supine to Sit, Sit to Supine     Supine to sit: Max assist, +2 for physical assistance Sit to supine: Max assist, +2 for physical assistance   General bed mobility comments: Heavy +2 assist for BLE and trunk control    Transfers                   General transfer comment: Pt declined to attempt secondary to weakness    Ambulation/Gait                   Stairs             Wheelchair Mobility    Modified Rankin (Stroke Patients Only)       Balance Overall balance assessment: Needs assistance Sitting-balance support: Feet supported, Bilateral upper extremity supported Sitting balance-Leahy Scale: Poor Sitting balance - Comments: Frequent min A for sitting balance with pt losing balance in all planes                                    Cognition Arousal/Alertness: Awake/alert Behavior During Therapy: WFL for tasks assessed/performed Overall Cognitive Status: Within Functional Limits for tasks assessed  Exercises Total Joint Exercises Ankle Circles/Pumps: AROM, Strengthening, Both, 5 reps, 10 reps Quad Sets: Strengthening, 5 reps, Both, 10 reps Gluteal Sets: Strengthening, Both, 5 reps, 10 reps Hip ABduction/ADduction: AAROM, Strengthening, Both, 10 reps Straight Leg Raises: AAROM, Strengthening, Both, 10 reps Long Arc Quad: Strengthening, Both, 10 reps, 15 reps Knee Flexion: Strengthening, Both, 10 reps, 15 reps Other Exercises Other Exercises: Static sitting at EOB for improved sitting balance and core  strengthening Other Exercises: HEP education for BLE APs, QS, and GS x 10 each every 1-2 hours daily    General Comments        Pertinent Vitals/Pain Pain Assessment Pain Assessment: 0-10 Pain Score: 10-Worst pain ever Pain Location: RLE Pain Descriptors / Indicators: Aching, Tightness, Sore Pain Intervention(s): Monitored during session, Premedicated before session, Repositioned, Patient requesting pain meds-RN notified    Home Living                          Prior Function            PT Goals (current goals can now be found in the care plan section) Progress towards PT goals: Not progressing toward goals - comment (limited by profound functional weakness)    Frequency    Min 2X/week      PT Plan Current plan remains appropriate    Co-evaluation              AM-PAC PT "6 Clicks" Mobility   Outcome Measure  Help needed turning from your back to your side while in a flat bed without using bedrails?: Total Help needed moving from lying on your back to sitting on the side of a flat bed without using bedrails?: Total Help needed moving to and from a bed to a chair (including a wheelchair)?: Total Help needed standing up from a chair using your arms (e.g., wheelchair or bedside chair)?: Total Help needed to walk in hospital room?: Total Help needed climbing 3-5 steps with a railing? : Total 6 Click Score: 6    End of Session Equipment Utilized During Treatment: Oxygen Activity Tolerance: Patient tolerated treatment well Patient left: in bed;with call bell/phone within reach;with bed alarm set Nurse Communication: Mobility status PT Visit Diagnosis: Other abnormalities of gait and mobility (R26.89);Muscle weakness (generalized) (M62.81)     Time: 2952-8413 PT Time Calculation (min) (ACUTE ONLY): 26 min  Charges:  $Therapeutic Exercise: 8-22 mins $Therapeutic Activity: 8-22 mins                     D. Scott Telina Kleckley PT, DPT 05/25/22, 4:01 PM

## 2022-05-25 NOTE — Progress Notes (Signed)
Occupational Therapy Treatment Patient Details Name: Adam Merritt MRN: [id number] DOB: [birth year] Today's Date: 05/25/2022   History of present illness Adam Merritt is a 24yoM who comes to [facility] 12/18 after a fall. Pt suspected to have a syncopal event in the setting of UTI. PMH: DM2, Stg3a CKD, CAD, HTN, lymphedema, diabetic neuropathy, GERD, venous insufficiency with BLE ulceratons. Reportedly pt has recently been at [facility] for rehab but never made it home due to fall/LOC in parking lot. 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 He has more recent BUE tremor in past few months.   OT comments  Upon entering the room, pt supine in bed with wife present in the room and agreeable to OT intervention. Pt with c/o B LE wraps being too tight and MD entered the room at end of session with pt planning on discussing with him. Supine >sit with max A to EOB. Pt initially holding self up with min guard but quickly needing min -mod A secondary to posterior bias for static sitting ~ 5 minutes. Pt reports fatigue and requesting to return to bed with total A. OT placing bed into trendelenburg and pt able to reach up and pull self up in bed with bed rails. All needs within reach and MD remains in room talking to pt's wife. OT continues to recommend SNF at discharge to address functional deficits.    Recommendations for follow up therapy are one component of a multi-disciplinary discharge planning process, led by the attending physician.  Recommendations may be updated based on patient status, additional functional criteria and insurance authorization.    Follow Up Recommendations  Skilled nursing-short term rehab (<3 hours/day)     Assistance Recommended at Discharge Frequent or constant Supervision/Assistance  Patient can return home with the following  Two people to help with walking and/or transfers;Two people to help with bathing/dressing/bathroom;Assistance with cooking/housework;Assist for transportation;Help  with stairs or ramp for entrance;Assistance with feeding   Equipment Recommendations  Other (comment) (defer to next venue of care)       Precautions / Restrictions Precautions Precautions: Fall Precaution Comments: Una boots bilaterally       Mobility Bed Mobility Overal bed mobility: Needs Assistance Bed Mobility: Supine to Sit, Sit to Supine     Supine to sit: Max assist Sit to supine: Total assist        Transfers                   General transfer comment: not attempted     Balance Overall balance assessment: Needs assistance Sitting-balance support: Feet supported, Bilateral upper extremity supported Sitting balance-Leahy Scale: Poor Sitting balance - Comments: min- mod A for seated balance with posterior bias                                   ADL either performed or assessed with clinical judgement    Extremity/Trunk Assessment Upper Extremity Assessment Upper Extremity Assessment: Generalized weakness   Lower Extremity Assessment Lower Extremity Assessment: Generalized weakness        Vision Baseline Vision/History: 1 Wears glasses            Cognition Arousal/Alertness: Awake/alert Behavior During Therapy: WFL for tasks assessed/performed Overall Cognitive Status: Within Functional Limits for tasks assessed  Pertinent Vitals/ Pain       Pain Assessment Pain Assessment: Faces Faces Pain Scale: Hurts a little bit Pain Location: R knee Pain Descriptors / Indicators: Aching, Discomfort Pain Intervention(s): Monitored during session, Repositioned, Limited activity within patient's tolerance         Frequency  Min 2X/week        Progress Toward Goals  OT Goals(current goals can now be found in the care plan section)  Progress towards OT goals: Progressing toward goals  Acute Rehab OT Goals Patient Stated Goal: to go to rehab OT Goal  Formulation: With patient/family Time For Goal Achievement: 06/06/22 Potential to Achieve Goals: [place] Discharge plan remains appropriate;Frequency remains appropriate       AM-PAC OT "6 Clicks" Daily Activity     Outcome Measure   Help from another person eating meals?: A Lot Help from another person taking care of personal grooming?: A Lot Help from another person toileting, which includes using toliet, bedpan, or urinal?: Total Help from another person bathing (including washing, rinsing, drying)?: Total Help from another person to put on and taking off regular upper body clothing?: A Lot Help from another person to put on and taking off regular lower body clothing?: Total 6 Click Score: 9    End of Session    OT Visit Diagnosis: Other abnormalities of gait and mobility (R26.89);Muscle weakness (generalized) (M62.81)   Activity Tolerance Patient limited by fatigue;Patient limited by pain   Patient Left in bed;with call bell/phone within reach;with family/visitor present   Nurse Communication Mobility status        Time: 4854-6270 OT Time Calculation (min): 16 min  Charges: OT General Charges $OT Visit: 1 Visit OT Treatments $Therapeutic Activity: 8-22 mins  Darleen Crocker, MS, OTR/L , CBIS ascom [phone]  05/25/22, 12:33 PM

## 2022-05-26 DIAGNOSIS — R531 Weakness: Secondary | ICD-10-CM | POA: Diagnosis not present

## 2022-05-26 DIAGNOSIS — N390 Urinary tract infection, site not specified: Secondary | ICD-10-CM | POA: Diagnosis not present

## 2022-05-26 LAB — GLUCOSE, CAPILLARY
Glucose-Capillary: 84 mg/dL (ref 70–99)
Glucose-Capillary: 79 mg/dL (ref 70–99)
Glucose-Capillary: 87 mg/dL (ref 70–99)
Glucose-Capillary: 112 mg/dL — ABNORMAL HIGH (ref 70–99)
Glucose-Capillary: 91 mg/dL (ref 70–99)
Glucose-Capillary: 103 mg/dL — ABNORMAL HIGH (ref 70–99)

## 2022-05-26 NOTE — TOC Transition Note (Signed)
Transition of Care [facility]) - CM/SW Discharge Note   Patient Details  Name: Adam Merritt MRN: [id number] Date of Birth: [birth year]  Transition of Care [facility]) CM/SW Contact:  [name], LCSW Phone Number: 05/26/2022, 10:02 AM   Clinical Narrative:    Patient lives with his wife, Bethena Roys, at home.  However, he admitted to [facility] from [facility] where he had been since 12/1.    Patient's PCP is Dr. Humphrey Rolls.  He gets his medications from [facility] and he has no concerns about getting his medications.  Bethena Roys transports him to and from his appointments since he's been having a hard time moving his extremities.  TOC will continue to follow for discharge needs.     Barriers to Discharge: Continued Medical Work up   Patient Goals and CMS Choice CMS Medicare.gov Compare Post Acute Care list provided to:: Patient Choice offered to / list presented to : Patient, Spouse, Adult Children  Discharge Placement                         Discharge Plan and Services Additional resources added to the After Visit Summary for       Post Acute Care Choice:  (unknown at this time)                               Social Determinants of Health (SDOH) Interventions SDOH Screenings   Food Insecurity: No Food Insecurity (05/23/2022)  Housing: Low Risk  (05/23/2022)  Transportation Needs: No Transportation Needs (05/23/2022)  Utilities: Not At Risk (05/23/2022)  Alcohol Screen: Low Risk  (02/28/2021)  Depression (PHQ2-9): Low Risk  (06/21/2021)  Financial Resource Strain: Low Risk  (04/10/2019)  Physical Activity: Inactive (04/10/2019)  Social Connections: Moderately Integrated (04/10/2019)  Stress: No Stress Concern Present (04/10/2019)  Tobacco Use: Low Risk  (05/23/2022)     Readmission Risk Interventions     No data to display

## 2022-05-26 NOTE — Care Management Important Message (Signed)
Important Message  Patient Details  Name: RADFORD PEASE MRN: [id number] Date of Birth: [birth year]   Medicare Important Message Given:  N/A - LOS <3 / Initial given by admissions     Juliann Pulse A Ignace Mandigo 05/26/2022, 7:47 AM

## 2022-05-26 NOTE — Progress Notes (Signed)
CSW phoned patient's wife, Bethena Roys and daughter, Horris Latino to discuss discharge plan. CSW explained to them that [facility] has offered a bed and CSW can call to see if that is an actual offer.  Patient was previously at [facility] for approximately 18 days prior to admitting on 12/18.  CSW explained that, per Medicare guidelines, patient has up to 20 days of rehab that is paid at 100% and then afterwards, patient would have to pay their co-pay per day.  CSW explained that, in order for the days to be paid at 100%, patient has to not have been in the hospital or a facility for 60 days.  The family stated that they understood.  CSW informed them that the choice is theirs, but if they didn't want to go to a facility and pay the co-pay, they could take patient home and we could set up [facility] services for him.  Patient's family will look into seeing if VA benefits will cover cost of rehab and contact CSW with their choice.  TOC to continue following for discharge.

## 2022-05-26 NOTE — Progress Notes (Addendum)
Progress Note    Adam Merritt  POE:[id number] DOB: September 19, 1945  DOA: 05/22/2022 PCP: [name], MD      Brief Narrative:    Medical records reviewed and are as summarized below:  Adam Merritt is a 76 y.o. male  with medical history significant for  diabetes mellitus with complications of stage IIIa chronic kidney disease, coronary artery disease, chronic diastolic CHF, history of stroke, hypertension, chronic lymphedema, diabetic neuropathy, GERD, history of venous insufficiency with bilateral lower extremity ulcerations.  He presented to the hospital because of generalized weakness, syncope and fall without head injury.  He also complained of urinary frequency and dysuria.     Assessment/Plan:   Principal Problem:   Syncope Active Problems:   Acute lower UTI   Dyslipidemia   Type 2 diabetes mellitus with peripheral neuropathy (HCC)   Fall   Weakness   Malnutrition of moderate degree    Body mass index is 48.09 kg/m.  (Morbid obesity)  Acute UTI: Urine culture showed Enterococcus faecalis and E. coli.  Continue nitrofurantoin for total of 7 days.   Renal ultrasound did not show any hydronephrosis, nephrolithiasis.  Post void residual urine on 05/24/2022  was 136 mls   S/p syncope: Probably from general weakness   Generalized weakness: PT and OT recommended discharge to SNF.  Follow-up with social worker to assist with disposition   Hypokalemia: Improved.   Tremors of bilateral upper extremities: his wife said he was supposed to see a neurologist at [facility] in November, 2023. Follow up with neurologist as an outpatient  Other comorbidities include CAD, type II DM, CKD stage IIIa, peripheral neuropathy, hyperlipidemia, chronic lymphedema lower extremities, history of stroke, chronic diastolic CHF    Diet Order             Diet 2 gram sodium Fluid consistency: Thin  Diet effective now                             Consultants: None  Procedures: None    Medications:    ascorbic acid  500 mg Oral BID   aspirin EC  325 mg Oral Daily   atorvastatin  40 mg Oral QHS   carvedilol  25 mg Oral BID WC   vitamin D3  1,000 Units Oral Daily   cyanocobalamin  1,000 mcg Oral Daily   docusate  50 mg Oral BID   enoxaparin (LOVENOX) injection  0.5 mg/kg Subcutaneous Q24H   feeding supplement  237 mL Oral TID BM   folic acid  1 mg Oral Daily   gabapentin  300 mg Oral BID   insulin aspart  0-9 Units Subcutaneous TID WC   insulin glargine-yfgn  30 Units Subcutaneous Daily   iron polysaccharides  150 mg Oral Daily   multivitamin with minerals  1 tablet Oral Daily   nitrofurantoin (macrocrystal-monohydrate)  100 mg Oral Q12H   pantoprazole  40 mg Oral Daily   pentoxifylline  400 mg Oral TID WC   prazosin  1 mg Oral QHS   sertraline  100 mg Oral Daily   traZODone  75 mg Oral QHS   zinc sulfate  220 mg Oral Daily   Continuous Infusions:     Anti-infectives (From admission, onward)    Start     Dose/Rate Route Frequency Ordered Stop   05/25/22 1500  nitrofurantoin (macrocrystal-monohydrate) (MACROBID) capsule 100 mg  100 mg Oral Every 12 hours 05/25/22 1412 06/01/22 0959   05/24/22 0200  cefTRIAXone (ROCEPHIN) 2 g in sodium chloride 0.9 % 100 mL IVPB  Status:  Discontinued        2 g 200 mL/hr over 30 Minutes Intravenous Every 24 hours 05/23/22 0217 05/25/22 1412   05/23/22 0230  cefTRIAXone (ROCEPHIN) 1 g in sodium chloride 0.9 % 100 mL IVPB        1 g 200 mL/hr over 30 Minutes Intravenous  Once 05/23/22 0229 05/23/22 0448   05/23/22 0130  cefTRIAXone (ROCEPHIN) 1 g in sodium chloride 0.9 % 100 mL IVPB        1 g 200 mL/hr over 30 Minutes Intravenous  Once 05/23/22 0122 05/23/22 0214              Family Communication/Anticipated D/C date and plan/Code Status   DVT prophylaxis:      Code Status: Full Code  Family Communication: Plan discussed with his  wife at the bedside Disposition Plan: SNF at discharge   Status is: Inpatient Remains inpatient appropriate because: Needs SNF at discharge          Subjective:   Interval events noted. He complains of lower abdominal pain. Brooke, Therapist, sports, reported that bladder scan showed 495 mL of urine.  Objective:    Vitals:   05/25/22 1634 05/25/22 2018 05/26/22 0543 05/26/22 0806  BP: (!) 119/55 124/60 (!) 119/59 102/71  Pulse: 65 (!) 58 (!) 55 60  Resp: '16 18 20 18  '$ Temp: 98.1 F (36.7 C) 98.2 F (36.8 C) 97.9 F (36.6 C) 97.9 F (36.6 C)  TempSrc:    Oral  SpO2: 100% 100% 100% 96%  Weight:      Height:       Orthostatic VS for the past 24 hrs:  BP- Lying Pulse- Lying BP- Sitting Pulse- Sitting  05/25/22 1557 115/55 62 95/51 64    No intake or output data in the 24 hours ending 05/26/22 1151  Filed Weights   05/22/22 1913  Weight: 131.1 kg    Exam:   GEN: NAD SKIN: Warm and dry EYES: No pallor or icterus ENT: MMM CV: RRR PULM: CTA B ABD: soft, obese, suprapubic tenderness without rebound tenderness or guarding, +BS CNS: AAO x 3, non focal EXT: Lymphedema bilateral legs, no tenderness      Pressure Injury 05/23/22 Buttocks Mid Stage 2 -  Partial thickness loss of dermis presenting as a shallow open injury with a red, pink wound bed without slough. (Active)  05/23/22 2000  Location: Buttocks  Location Orientation: Mid  Staging: Stage 2 -  Partial thickness loss of dermis presenting as a shallow open injury with a red, pink wound bed without slough.  Wound Description (Comments):   Present on Admission: Yes  Dressing Type Foam - Lift dressing to assess site every shift 05/24/22 2200     Data Reviewed:   I have personally reviewed following labs and imaging studies:  Labs: Labs show the following:   Basic Metabolic Panel: Recent Labs  Lab 05/22/22 1917 05/23/22 0450 05/24/22 0356 05/25/22 0402  NA 135 137 137 139  K 3.5 4.3 3.4* 3.7  CL 100  103 102 105  CO2 '27 26 27 26  '$ GLUCOSE 137* 122* 104* 81  BUN 23 24* 23 21  CREATININE 1.43* 1.41* 1.41* 1.36*  CALCIUM 8.8* 8.7* 8.7* 8.6*  MG  --   --  1.9 2.2   GFR Estimated Creatinine Clearance:  57.5 mL/min (A) (by C-G formula based on SCr of 1.36 mg/dL (H)). Liver Function Tests: Recent Labs  Lab 05/22/22 1917  AST 48*  ALT 46*  ALKPHOS 83  BILITOT 0.7  PROT 7.0  ALBUMIN 2.9*   No results for input(s): "LIPASE", "AMYLASE" in the last 168 hours. Recent Labs  Lab 05/22/22 1917  AMMONIA <10   Coagulation profile No results for input(s): "INR", "PROTIME" in the last 168 hours.  CBC: Recent Labs  Lab 05/22/22 1917 05/23/22 0450 05/24/22 0356  WBC 10.8* 9.6 7.0  NEUTROABS 8.9*  --   --   HGB 13.1 12.4* 12.4*  HCT 41.6 38.8* 37.9*  MCV 92.4 90.7 89.6  PLT 152 151 145*   Cardiac Enzymes: No results for input(s): "CKTOTAL", "CKMB", "CKMBINDEX", "TROPONINI" in the last 168 hours. BNP (last 3 results) No results for input(s): "PROBNP" in the last 8760 hours. CBG: Recent Labs  Lab 05/25/22 1211 05/25/22 1559 05/26/22 0232 05/26/22 0539 05/26/22 0807  GLUCAP 143* 103* 84 79 87   D-Dimer: No results for input(s): "DDIMER" in the last 72 hours. Hgb A1c: No results for input(s): "HGBA1C" in the last 72 hours. Lipid Profile: No results for input(s): "CHOL", "HDL", "LDLCALC", "TRIG", "CHOLHDL", "LDLDIRECT" in the last 72 hours. Thyroid function studies: No results for input(s): "TSH", "T4TOTAL", "T3FREE", "THYROIDAB" in the last 72 hours.  Invalid input(s): "FREET3" Anemia work up: No results for input(s): "VITAMINB12", "FOLATE", "FERRITIN", "TIBC", "IRON", "RETICCTPCT" in the last 72 hours. Sepsis Labs: Recent Labs  Lab 05/22/22 1917 05/23/22 0450 05/24/22 0356  WBC 10.8* 9.6 7.0    Microbiology Recent Results (from the past 240 hour(s))  Resp panel by RT-PCR (RSV, Flu A&B, Covid) Anterior Nasal Swab     Status: None   Collection Time: 05/22/22   7:17 PM   Specimen: Anterior Nasal Swab  Result Value Ref Range Status   SARS Coronavirus 2 by RT PCR NEGATIVE NEGATIVE Final    Comment: (NOTE) SARS-CoV-2 target nucleic acids are NOT DETECTED.  The SARS-CoV-2 RNA is generally detectable in upper respiratory specimens during the acute phase of infection. The lowest concentration of SARS-CoV-2 viral copies this assay can detect is 138 copies/mL. A negative result does not preclude SARS-Cov-2 infection and should not be used as the sole basis for treatment or other patient management decisions. A negative result may occur with  improper specimen collection/handling, submission of specimen other than nasopharyngeal swab, presence of viral mutation(s) within the areas targeted by this assay, and inadequate number of viral copies(<138 copies/mL). A negative result must be combined with clinical observations, patient history, and epidemiological information. The expected result is Negative.  Fact Sheet for Patients:  EntrepreneurPulse.com.au  Fact Sheet for Healthcare Providers:  IncredibleEmployment.be  This test is no t yet approved or cleared by the [place] FDA and  has been authorized for detection and/or diagnosis of SARS-CoV-2 by FDA under an Emergency Use Authorization (EUA). This EUA will remain  in effect (meaning this test can be used) for the duration of the COVID-19 declaration under Section 564(b)(1) of the Act, 21 U.S.C.section 360bbb-3(b)(1), unless the authorization is terminated  or revoked sooner.       Influenza A by PCR NEGATIVE NEGATIVE Final   Influenza B by PCR NEGATIVE NEGATIVE Final    Comment: (NOTE) The Xpert Xpress SARS-CoV-2/FLU/RSV plus assay is intended as an aid in the diagnosis of influenza from Nasopharyngeal swab specimens and should not be used as a sole basis for treatment.  Nasal washings and aspirates are unacceptable for Xpert Xpress  SARS-CoV-2/FLU/RSV testing.  Fact Sheet for Patients: EntrepreneurPulse.com.au  Fact Sheet for Healthcare Providers: IncredibleEmployment.be  This test is not yet approved or cleared by the [place] FDA and has been authorized for detection and/or diagnosis of SARS-CoV-2 by FDA under an Emergency Use Authorization (EUA). This EUA will remain in effect (meaning this test can be used) for the duration of the COVID-19 declaration under Section 564(b)(1) of the Act, 21 U.S.C. section 360bbb-3(b)(1), unless the authorization is terminated or revoked.     Resp Syncytial Virus by PCR NEGATIVE NEGATIVE Final    Comment: (NOTE) Fact Sheet for Patients: EntrepreneurPulse.com.au  Fact Sheet for Healthcare Providers: IncredibleEmployment.be  This test is not yet approved or cleared by the [place] FDA and has been authorized for detection and/or diagnosis of SARS-CoV-2 by FDA under an Emergency Use Authorization (EUA). This EUA will remain in effect (meaning this test can be used) for the duration of the COVID-19 declaration under Section 564(b)(1) of the Act, 21 U.S.C. section 360bbb-3(b)(1), unless the authorization is terminated or revoked.  Performed at [facility], [address] [place], [place] [address]   Urine Culture     Status: Abnormal   Collection Time: 05/22/22  7:17 PM   Specimen: Urine, Random  Result Value Ref Range Status   Specimen Description   Final    URINE, RANDOM Performed at [facility], [address] [place], [place] [address]    Special Requests   Final    NONE Performed at [facility], [place], [place] [address]    Culture (A)  Final    >=100,000 COLONIES/mL ENTEROCOCCUS FAECALIS 10,000 COLONIES/mL ESCHERICHIA COLI    Report Status 2022/06/02 FINAL  Final   Organism ID, Bacteria ENTEROCOCCUS FAECALIS (A)  Final    Organism ID, Bacteria ESCHERICHIA COLI (A)  Final      Susceptibility   Escherichia coli - MIC*    AMPICILLIN >=32 RESISTANT Resistant     CEFAZOLIN <=4 SENSITIVE Sensitive     CEFEPIME <=0.12 SENSITIVE Sensitive     CEFTRIAXONE <=0.25 SENSITIVE Sensitive     CIPROFLOXACIN >=4 RESISTANT Resistant     GENTAMICIN <=1 SENSITIVE Sensitive     IMIPENEM <=0.25 SENSITIVE Sensitive     NITROFURANTOIN <=16 SENSITIVE Sensitive     TRIMETH/SULFA >=320 RESISTANT Resistant     AMPICILLIN/SULBACTAM 16 INTERMEDIATE Intermediate     PIP/TAZO <=4 SENSITIVE Sensitive     * 10,000 COLONIES/mL ESCHERICHIA COLI   Enterococcus faecalis - MIC*    AMPICILLIN <=2 SENSITIVE Sensitive     NITROFURANTOIN <=16 SENSITIVE Sensitive     VANCOMYCIN 1 SENSITIVE Sensitive     * >=100,000 COLONIES/mL ENTEROCOCCUS FAECALIS    Procedures and diagnostic studies:  US RENAL  Result Date: 06-02-2022 CLINICAL DATA:  Urinary tract infection, history CHF, diabetes mellitus, hypertension EXAM: RENAL / URINARY TRACT ULTRASOUND COMPLETE COMPARISON:  04/17/2018 FINDINGS: Right Kidney: Renal measurements: 9.8 x 5.4 x 4.7 cm = volume: 131 mL. Cortical thinning. Normal cortical echogenicity. Slightly lobulated contour. No mass or hydronephrosis. No shadowing calculi. Left Kidney: Renal measurements: 11.4 x 4.7 x 6.2 cm = volume: 208 ML. Cortical thinning. Normal cortical echogenicity. No mass, hydronephrosis, or shadowing calcification. Bladder: Appears normal for degree of bladder distention. Other: Exam quality degraded secondary to body habitus. IMPRESSION: No evidence of renal mass or hydronephrosis. Electronically Signed   By: Lavonia Dana M.D.   On:  05/25/2022 10:25               LOS: 2 days   Joeanthony Seeling  [facility]   Pager on [email or web address]. If 7PM-7AM, please contact night-coverage at [email or web address]     05/26/2022, 11:51 AM

## 2022-05-26 NOTE — Progress Notes (Signed)
Physical Therapy Treatment Patient Details Name: Adam Merritt MRN: [id number] DOB: 11/19/1945 Today's Date: 05/26/2022   History of Present Illness Adam Merritt is a 15yoM who comes [facility] 12/18 after a fall. PMH includes: DM2, CKD 3a, CAD, HTN, lymphedema, diabetic neuropathy, GERD, venous insufficiency with BLE ulceratons. 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 He has more recent BUE tremor in past few months. MD assessment includes: acute UTI, syncope, generalized weakness, and hypokalemia.    PT Comments    Pt alert and agreeable to participation with PT services.  Pt required extensive assistance with all bed mobility tasks and continued to require min A for stability in sitting but grossly improved from prior session.  Pt able to make multiple attempts to come to standing at the EOB and although he was able to minimally unweight his bottom from the bed he was unable to clear the surface of the mattress even with the bed in an elevated position.  Pt's SpO2 and HR were both WNL during the session on room air.  Pt will benefit from PT services in a SNF setting upon discharge to safely address deficits listed in patient problem list for decreased caregiver assistance and eventual return to PLOF.    Recommendations for follow up therapy are one component of a multi-disciplinary discharge planning process, led by the attending physician.  Recommendations may be updated based on patient status, additional functional criteria and insurance authorization.  Follow Up Recommendations  Skilled nursing-short term rehab (<3 hours/day) Can patient physically be transported by private vehicle: No   Assistance Recommended at Discharge Frequent or constant Supervision/Assistance  Patient can return home with the following Two people to help with walking and/or transfers;Two people to help with bathing/dressing/bathroom;Assistance with cooking/housework;Assistance with feeding;Direct supervision/assist for  medications management;Assist for transportation;Help with stairs or ramp for entrance   Equipment Recommendations  Other (comment) (TBD)    Recommendations for Other Services       Precautions / Restrictions Precautions Precautions: Fall Restrictions Weight Bearing Restrictions: No     Mobility  Bed Mobility Overal bed mobility: Needs Assistance Bed Mobility: Supine to Sit, Sit to Supine, Rolling Rolling: Max assist, +2 for physical assistance   Supine to sit: Max assist, +2 for physical assistance Sit to supine: Max assist, +2 for physical assistance   General bed mobility comments: Heavy +2 assist for BLE and trunk control    Transfers                   General transfer comment: Multiple attempts to stand with pt able to minimally unweight his bottom from the bed but unable to clear the surface    Ambulation/Gait                   Stairs             Wheelchair Mobility    Modified Rankin (Stroke Patients Only)       Balance Overall balance assessment: Needs assistance Sitting-balance support: Feet supported, Bilateral upper extremity supported Sitting balance-Leahy Scale: Poor Sitting balance - Comments: Occasional min A for sitting balance with pt losing balance in all planes       Standing balance comment: Unable to stand                            Cognition Arousal/Alertness: Awake/alert Behavior During Therapy: WFL for tasks assessed/performed Overall Cognitive Status: Within Functional Limits for  tasks assessed                                          Exercises Total Joint Exercises Ankle Circles/Pumps: AROM, Strengthening, Both, 5 reps, 10 reps Quad Sets: Strengthening, 5 reps, Both, 10 reps Gluteal Sets: Strengthening, Both, 5 reps, 10 reps Hip ABduction/ADduction: AAROM, Strengthening, Both, 10 reps Straight Leg Raises: AAROM, Strengthening, Both, 10 reps Long Arc Quad: Strengthening,  Both, 10 reps Knee Flexion: Strengthening, Both, 10 reps Other Exercises Other Exercises: Static sitting at EOB for improved sitting balance and core strengthening    General Comments        Pertinent Vitals/Pain Pain Assessment Pain Assessment: 0-10 Pain Score: 4  Pain Location: BLE's Pain Descriptors / Indicators: Aching, Sore Pain Intervention(s): Repositioned, Premedicated before session, Monitored during session    Home Living                          Prior Function            PT Goals (current goals can now be found in the care plan section) Progress towards PT goals: Progressing toward goals    Frequency    Min 2X/week      PT Plan Current plan remains appropriate    Co-evaluation              AM-PAC PT "6 Clicks" Mobility   Outcome Measure  Help needed turning from your back to your side while in a flat bed without using bedrails?: Total Help needed moving from lying on your back to sitting on the side of a flat bed without using bedrails?: Total Help needed moving to and from a bed to a chair (including a wheelchair)?: Total Help needed standing up from a chair using your arms (e.g., wheelchair or bedside chair)?: Total Help needed to walk in hospital room?: Total Help needed climbing 3-5 steps with a railing? : Total 6 Click Score: 6    End of Session Equipment Utilized During Treatment: Gait belt Activity Tolerance: Patient tolerated treatment well Patient left: in bed;with call bell/phone within reach;with bed alarm set Nurse Communication: Mobility status PT Visit Diagnosis: Other abnormalities of gait and mobility (R26.89);Muscle weakness (generalized) (M62.81)     Time: 7517-0017 PT Time Calculation (min) (ACUTE ONLY): 29 min  Charges:  $Therapeutic Exercise: 8-22 mins $Therapeutic Activity: 8-22 mins                     D. Scott Yides Saidi PT, DPT 05/26/22, 3:49 PM

## 2022-05-27 DIAGNOSIS — N390 Urinary tract infection, site not specified: Secondary | ICD-10-CM | POA: Diagnosis not present

## 2022-05-27 DIAGNOSIS — R531 Weakness: Secondary | ICD-10-CM | POA: Diagnosis not present

## 2022-05-27 LAB — GLUCOSE, CAPILLARY
Glucose-Capillary: 82 mg/dL (ref 70–99)
Glucose-Capillary: 90 mg/dL (ref 70–99)
Glucose-Capillary: 92 mg/dL (ref 70–99)
Glucose-Capillary: 80 mg/dL (ref 70–99)
Glucose-Capillary: 104 mg/dL — ABNORMAL HIGH (ref 70–99)

## 2022-05-27 NOTE — Progress Notes (Signed)
Progress Note    Adam Merritt  XQJ:[id number] DOB: [birth year]  DOA: 05/22/2022 PCP: Adam Guise, MD      Brief Narrative:    Medical records reviewed and are as summarized below:  Adam Merritt is a 76 y.o. male  with medical history significant for  diabetes mellitus with complications of stage IIIa chronic kidney disease, coronary artery disease, chronic diastolic CHF, history of stroke, hypertension, chronic lymphedema, diabetic neuropathy, GERD, history of venous insufficiency with bilateral lower extremity ulcerations.  He presented to the hospital because of generalized weakness, syncope and fall without head injury.  He also complained of urinary frequency and dysuria.     Assessment/Plan:   Principal Problem:   Syncope Active Problems:   Acute lower UTI   Dyslipidemia   Type 2 diabetes mellitus with peripheral neuropathy (HCC)   Fall   Weakness   Malnutrition of moderate degree    Body mass index is 48.09 kg/m.  (Morbid obesity)  Acute UTI: Urine culture showed Enterococcus faecalis and E. coli.  Continue nitrofurantoin for 7 days.  Renal ultrasound did not show any hydronephrosis, nephrolithiasis.  Post void residual urine on 05/24/2022  was 136 mls   S/p syncope: Probably from general weakness   Generalized weakness: PT and OT recommended discharge to SNF.  Follow-up with social worker to assist with disposition   Hypokalemia: Improved.   Tremors of bilateral upper extremities: his wife said he was supposed to see a neurologist at [facility] in November, 2023. Follow up with neurologist as an outpatient  Other comorbidities include CAD, type II DM, CKD stage IIIa, peripheral neuropathy, hyperlipidemia, chronic lymphedema lower extremities, history of stroke, chronic diastolic CHF (2D echo in August 2021 showed normal EF)    Diet Order             Diet 2 gram sodium Fluid consistency: Thin  Diet effective now                             Consultants: None  Procedures: None    Medications:    ascorbic acid  500 mg Oral BID   aspirin EC  325 mg Oral Daily   atorvastatin  40 mg Oral QHS   carvedilol  25 mg Oral BID WC   vitamin D3  1,000 Units Oral Daily   cyanocobalamin  1,000 mcg Oral Daily   docusate  50 mg Oral BID   enoxaparin (LOVENOX) injection  0.5 mg/kg Subcutaneous Q24H   feeding supplement  237 mL Oral TID BM   folic acid  1 mg Oral Daily   gabapentin  300 mg Oral BID   insulin aspart  0-9 Units Subcutaneous TID WC   insulin glargine-yfgn  30 Units Subcutaneous Daily   iron polysaccharides  150 mg Oral Daily   multivitamin with minerals  1 tablet Oral Daily   nitrofurantoin (macrocrystal-monohydrate)  100 mg Oral Q12H   pantoprazole  40 mg Oral Daily   pentoxifylline  400 mg Oral TID WC   prazosin  1 mg Oral QHS   sertraline  100 mg Oral Daily   traZODone  75 mg Oral QHS   zinc sulfate  220 mg Oral Daily   Continuous Infusions:     Anti-infectives (From admission, onward)    Start     Dose/Rate Route Frequency Ordered Stop   05/25/22 1500  nitrofurantoin (macrocrystal-monohydrate) (MACROBID) capsule 100 mg  100 mg Oral Every 12 hours 05/25/22 1412 06/01/22 0959   05/24/22 0200  cefTRIAXone (ROCEPHIN) 2 g in sodium chloride 0.9 % 100 mL IVPB  Status:  Discontinued        2 g 200 mL/hr over 30 Minutes Intravenous Every 24 hours 05/23/22 0217 05/25/22 1412   05/23/22 0230  cefTRIAXone (ROCEPHIN) 1 g in sodium chloride 0.9 % 100 mL IVPB        1 g 200 mL/hr over 30 Minutes Intravenous  Once 05/23/22 0229 05/23/22 0448   05/23/22 0130  cefTRIAXone (ROCEPHIN) 1 g in sodium chloride 0.9 % 100 mL IVPB        1 g 200 mL/hr over 30 Minutes Intravenous  Once 05/23/22 0122 05/23/22 0214              Family Communication/Anticipated D/C date and plan/Code Status   DVT prophylaxis:      Code Status: Full Code  Family Communication: None Disposition Plan:  SNF at discharge   Status is: Inpatient Remains inpatient appropriate because: Needs SNF at discharge          Subjective:   Interval events noted.  He has no complaints.  He was able to urinate on his own.  Objective:    Vitals:   05/26/22 1706 05/26/22 2124 05/27/22 0555 05/27/22 0849  BP: 135/65 (!) 121/54 137/60 124/66  Pulse: (!) 57 (!) 56 (!) 53 (!) 55  Resp: '16 16 16 19  '$ Temp: 97.9 F (36.6 C) 97.9 F (36.6 C) (!) 97.2 F (36.2 C) (!) 97.3 F (36.3 C)  TempSrc: Oral Oral  Oral  SpO2: 93% 97% 97% 100%  Weight:      Height:       No data found.    Intake/Output Summary (Last 24 hours) at 05/27/2022 1316 Last data filed at 05/26/2022 1412 Gross per 24 hour  Intake --  Output 600 ml  Net -600 ml    Filed Weights   05/22/22 1913  Weight: 131.1 kg    Exam:  GEN: NAD SKIN: Warm and dry EYES: Anicteric ENT: MMM CV: RRR PULM: CTA B ABD: soft, obese, NT, +BS CNS: AAO x 3, non focal EXT: Chronic lymphedema bilateral legs, no tenderness     Pressure Injury 05/23/22 Buttocks Mid Stage 2 -  Partial thickness loss of dermis presenting as a shallow open injury with a red, pink wound bed without slough. (Active)  05/23/22 2000  Location: Buttocks  Location Orientation: Mid  Staging: Stage 2 -  Partial thickness loss of dermis presenting as a shallow open injury with a red, pink wound bed without slough.  Wound Description (Comments):   Present on Admission: Yes  Dressing Type Foam - Lift dressing to assess site every shift 05/24/22 2200     Data Reviewed:   I have personally reviewed following labs and imaging studies:  Labs: Labs show the following:   Basic Metabolic Panel: Recent Labs  Lab 05/22/22 1917 05/23/22 0450 05/24/22 0356 05/25/22 0402  NA 135 137 137 139  K 3.5 4.3 3.4* 3.7  CL 100 103 102 105  CO2 '27 26 27 26  '$ GLUCOSE 137* 122* 104* 81  BUN 23 24* 23 21  CREATININE 1.43* 1.41* 1.41* 1.36*  CALCIUM 8.8* 8.7* 8.7*  8.6*  MG  --   --  1.9 2.2   GFR Estimated Creatinine Clearance: 57.5 mL/min (A) (by C-G formula based on SCr of 1.36 mg/dL (H)). Liver Function Tests: Recent Labs  Lab 05/22/22 1917  AST 48*  ALT 46*  ALKPHOS 83  BILITOT 0.7  PROT 7.0  ALBUMIN 2.9*   No results for input(s): "LIPASE", "AMYLASE" in the last 168 hours. Recent Labs  Lab 05/22/22 1917  AMMONIA <10   Coagulation profile No results for input(s): "INR", "PROTIME" in the last 168 hours.  CBC: Recent Labs  Lab 05/22/22 1917 05/23/22 0450 05/24/22 0356  WBC 10.8* 9.6 7.0  NEUTROABS 8.9*  --   --   HGB 13.1 12.4* 12.4*  HCT 41.6 38.8* 37.9*  MCV 92.4 90.7 89.6  PLT 152 151 145*   Cardiac Enzymes: No results for input(s): "CKTOTAL", "CKMB", "CKMBINDEX", "TROPONINI" in the last 168 hours. BNP (last 3 results) No results for input(s): "PROBNP" in the last 8760 hours. CBG: Recent Labs  Lab 05/26/22 1654 05/26/22 2119 05/27/22 0552 05/27/22 0848 05/27/22 1153  GLUCAP 91 103* 82 90 92   D-Dimer: No results for input(s): "DDIMER" in the last 72 hours. Hgb A1c: No results for input(s): "HGBA1C" in the last 72 hours. Lipid Profile: No results for input(s): "CHOL", "HDL", "LDLCALC", "TRIG", "CHOLHDL", "LDLDIRECT" in the last 72 hours. Thyroid function studies: No results for input(s): "TSH", "T4TOTAL", "T3FREE", "THYROIDAB" in the last 72 hours.  Invalid input(s): "FREET3" Anemia work up: No results for input(s): "VITAMINB12", "FOLATE", "FERRITIN", "TIBC", "IRON", "RETICCTPCT" in the last 72 hours. Sepsis Labs: Recent Labs  Lab 05/22/22 1917 05/23/22 0450 05/24/22 0356  WBC 10.8* 9.6 7.0    Microbiology Recent Results (from the past 240 hour(s))  Resp panel by RT-PCR (RSV, Flu A&B, Covid) Anterior Nasal Swab     Status: None   Collection Time: 05/22/22  7:17 PM   Specimen: Anterior Nasal Swab  Result Value Ref Range Status   SARS Coronavirus 2 by RT PCR NEGATIVE NEGATIVE Final    Comment:  (NOTE) SARS-CoV-2 target nucleic acids are NOT DETECTED.  The SARS-CoV-2 RNA is generally detectable in upper respiratory specimens during the acute phase of infection. The lowest concentration of SARS-CoV-2 viral copies this assay can detect is 138 copies/mL. A negative result does not preclude SARS-Cov-2 infection and should not be used as the sole basis for treatment or other patient management decisions. A negative result may occur with  improper specimen collection/handling, submission of specimen other than nasopharyngeal swab, presence of viral mutation(s) within the areas targeted by this assay, and inadequate number of viral copies(<138 copies/mL). A negative result must be combined with clinical observations, patient history, and epidemiological information. The expected result is Negative.  Fact Sheet for Patients:  EntrepreneurPulse.com.au  Fact Sheet for Healthcare Providers:  IncredibleEmployment.be  This test is no t yet approved or cleared by the [place] FDA and  has been authorized for detection and/or diagnosis of SARS-CoV-2 by FDA under an Emergency Use Authorization (EUA). This EUA will remain  in effect (meaning this test can be used) for the duration of the COVID-19 declaration under Section 564(b)(1) of the Act, 21 U.S.C.section 360bbb-3(b)(1), unless the authorization is terminated  or revoked sooner.       Influenza A by PCR NEGATIVE NEGATIVE Final   Influenza B by PCR NEGATIVE NEGATIVE Final    Comment: (NOTE) The Xpert Xpress SARS-CoV-2/FLU/RSV plus assay is intended as an aid in the diagnosis of influenza from Nasopharyngeal swab specimens and should not be used as a sole basis for treatment. Nasal washings and aspirates are unacceptable for Xpert Xpress SARS-CoV-2/FLU/RSV testing.  Fact Sheet for Patients: EntrepreneurPulse.com.au  Fact  Sheet for Healthcare  Providers: IncredibleEmployment.be  This test is not yet approved or cleared by the [place] and has been authorized for detection and/or diagnosis of SARS-CoV-2 by FDA under an Emergency Use Authorization (EUA). This EUA will remain in effect (meaning this test can be used) for the duration of the COVID-19 declaration under Section 564(b)(1) of the Act, 21 U.S.C. section 360bbb-3(b)(1), unless the authorization is terminated or revoked.     Resp Syncytial Virus by PCR NEGATIVE NEGATIVE Final    Comment: (NOTE) Fact Sheet for Patients: EntrepreneurPulse.com.au  Fact Sheet for Healthcare Providers: IncredibleEmployment.be  This test is not yet approved or cleared by the [place] FDA and has been authorized for detection and/or diagnosis of SARS-CoV-2 by FDA under an Emergency Use Authorization (EUA). This EUA will remain in effect (meaning this test can be used) for the duration of the COVID-19 declaration under Section 564(b)(1) of the Act, 21 U.S.C. section 360bbb-3(b)(1), unless the authorization is terminated or revoked.  Performed at [facility], [address] [place], [place] [address]   Urine Culture     Status: Abnormal   Collection Time: 05/22/22  7:17 PM   Specimen: Urine, Random  Result Value Ref Range Status   Specimen Description   Final    URINE, RANDOM Performed at [facility], [address] [place], [place] [address]    Special Requests   Final    NONE Performed at [facility], [place],  [address]    Culture (A)  Final    >=100,000 COLONIES/mL ENTEROCOCCUS FAECALIS 10,000 COLONIES/mL ESCHERICHIA COLI    Report Status 05/25/2022 FINAL  Final   Organism ID, Bacteria ENTEROCOCCUS FAECALIS (A)  Final   Organism ID, Bacteria ESCHERICHIA COLI (A)  Final      Susceptibility   Escherichia coli - MIC*    AMPICILLIN >=32 RESISTANT  Resistant     CEFAZOLIN <=4 SENSITIVE Sensitive     CEFEPIME <=0.12 SENSITIVE Sensitive     CEFTRIAXONE <=0.25 SENSITIVE Sensitive     CIPROFLOXACIN >=4 RESISTANT Resistant     GENTAMICIN <=1 SENSITIVE Sensitive     IMIPENEM <=0.25 SENSITIVE Sensitive     NITROFURANTOIN <=16 SENSITIVE Sensitive     TRIMETH/SULFA >=320 RESISTANT Resistant     AMPICILLIN/SULBACTAM 16 INTERMEDIATE Intermediate     PIP/TAZO <=4 SENSITIVE Sensitive     * 10,000 COLONIES/mL ESCHERICHIA COLI   Enterococcus faecalis - MIC*    AMPICILLIN <=2 SENSITIVE Sensitive     NITROFURANTOIN <=16 SENSITIVE Sensitive     VANCOMYCIN 1 SENSITIVE Sensitive     * >=100,000 COLONIES/mL ENTEROCOCCUS FAECALIS    Procedures and diagnostic studies:  No results found.             LOS: 3 days   Rosha Cocker  [facility]   Pager on [email or web address]. If 7PM-7AM, please contact night-coverage at [email or web address]     05/27/2022, 1:16 PM

## 2022-05-28 DIAGNOSIS — R531 Weakness: Secondary | ICD-10-CM | POA: Diagnosis not present

## 2022-05-28 DIAGNOSIS — N390 Urinary tract infection, site not specified: Secondary | ICD-10-CM | POA: Diagnosis not present

## 2022-05-28 LAB — GLUCOSE, CAPILLARY
Glucose-Capillary: 77 mg/dL (ref 70–99)
Glucose-Capillary: 110 mg/dL — ABNORMAL HIGH (ref 70–99)
Glucose-Capillary: 74 mg/dL (ref 70–99)
Glucose-Capillary: 70 mg/dL (ref 70–99)

## 2022-05-28 NOTE — Progress Notes (Signed)
Progress Note    Adam Merritt  JSH:[id number] DOB: [birth year]  DOA: 05/22/2022 PCP: [name], MD      Brief Narrative:    Medical records reviewed and are as summarized below:  Adam Merritt is a 76 y.o. male  with medical history significant for Bell's palsy,  diabetes mellitus with complications of stage IIIa chronic kidney disease, coronary artery disease, chronic diastolic CHF, history of stroke, hypertension, chronic lymphedema, diabetic neuropathy, GERD, history of venous insufficiency with bilateral lower extremity ulcerations.  He presented to the hospital because of generalized weakness, syncope and fall without head injury.  He also complained of urinary frequency and dysuria.     Assessment/Plan:   Principal Problem:   Syncope Active Problems:   Acute lower UTI   Dyslipidemia   Type 2 diabetes mellitus with peripheral neuropathy (HCC)   Fall   Weakness   Malnutrition of moderate degree    Body mass index is 48.09 kg/m.  (Morbid obesity)  Acute UTI: Urine culture showed Enterococcus faecalis and E. coli.  Continue nitrofurantoin through 05/31/2022.  Renal ultrasound did not show any hydronephrosis, nephrolithiasis.  Post void residual urine on 05/24/2022  was 136 mls   S/p syncope: Probably from general weakness   Generalized weakness: PT and OT recommended discharge to SNF.  Follow-up with social worker to assist with disposition   Hypokalemia: Improved.   Tremors of bilateral upper extremities: his wife said he was supposed to see a neurologist at [facility] in November, 2023. Follow up with neurologist as an outpatient  Other comorbidities include CAD, type II DM, CKD stage IIIa, peripheral neuropathy, hyperlipidemia, chronic lymphedema lower extremities, history of stroke, chronic diastolic CHF (2D echo in August 2021 showed normal EF)    Diet Order             Diet 2 gram sodium Fluid consistency: Thin  Diet effective now                             Consultants: None  Procedures: None    Medications:    ascorbic acid  500 mg Oral BID   aspirin EC  325 mg Oral Daily   atorvastatin  40 mg Oral QHS   carvedilol  25 mg Oral BID WC   vitamin D3  1,000 Units Oral Daily   cyanocobalamin  1,000 mcg Oral Daily   docusate  50 mg Oral BID   enoxaparin (LOVENOX) injection  0.5 mg/kg Subcutaneous Q24H   feeding supplement  237 mL Oral TID BM   folic acid  1 mg Oral Daily   gabapentin  300 mg Oral BID   insulin aspart  0-9 Units Subcutaneous TID WC   insulin glargine-yfgn  30 Units Subcutaneous Daily   iron polysaccharides  150 mg Oral Daily   multivitamin with minerals  1 tablet Oral Daily   nitrofurantoin (macrocrystal-monohydrate)  100 mg Oral Q12H   pantoprazole  40 mg Oral Daily   pentoxifylline  400 mg Oral TID WC   prazosin  1 mg Oral QHS   sertraline  100 mg Oral Daily   traZODone  75 mg Oral QHS   zinc sulfate  220 mg Oral Daily   Continuous Infusions:     Anti-infectives (From admission, onward)    Start     Dose/Rate Route Frequency Ordered Stop   05/25/22 1500  nitrofurantoin (macrocrystal-monohydrate) (MACROBID) capsule 100 mg  100 mg Oral Every 12 hours 05/25/22 1412 06/01/22 0959   05/24/22 0200  cefTRIAXone (ROCEPHIN) 2 g in sodium chloride 0.9 % 100 mL IVPB  Status:  Discontinued        2 g 200 mL/hr over 30 Minutes Intravenous Every 24 hours 05/23/22 0217 05/25/22 1412   05/23/22 0230  cefTRIAXone (ROCEPHIN) 1 g in sodium chloride 0.9 % 100 mL IVPB        1 g 200 mL/hr over 30 Minutes Intravenous  Once 05/23/22 0229 05/23/22 0448   05/23/22 0130  cefTRIAXone (ROCEPHIN) 1 g in sodium chloride 0.9 % 100 mL IVPB        1 g 200 mL/hr over 30 Minutes Intravenous  Once 05/23/22 0122 05/23/22 0214              Family Communication/Anticipated D/C date and plan/Code Status   DVT prophylaxis:      Code Status: Full Code  Family Communication:  None Disposition Plan: SNF at discharge   Status is: Inpatient Remains inpatient appropriate because: Needs SNF at discharge          Subjective:   No acute issues overnight.  No abdominal pain, shortness of breath or chest pain.  He feels okay.  Objective:   No vital signs on chart for this morning.  Requested vital signs from [facility], Therapist, sports.  Vitals:   05/27/22 0555 05/27/22 0849 05/27/22 1631 05/27/22 2315  BP: 137/60 124/66 (!) 93/37 (!) 117/49  Pulse: (!) 53 (!) 55 (!) 54   Resp: '16 19 19   '$ Temp: (!) 97.2 F (36.2 C) (!) 97.3 F (36.3 C) 98.1 F (36.7 C) 97.8 F (36.6 C)  TempSrc:  Oral Oral Oral  SpO2: 97% 100% 96% 99%  Weight:      Height:       No vital signs on chart for today.  Required vital signs from D[place], RN   No data found.    Intake/Output Summary (Last 24 hours) at 05/28/2022 1453 Last data filed at 05/28/2022 1050 Gross per 24 hour  Intake 120 ml  Output 750 ml  Net -630 ml    Filed Weights   05/22/22 1913  Weight: 131.1 kg    Exam:  GEN: NAD SKIN: Warm and dry EYES: EOMI ENT: MMM CV: RRR PULM: CTA B ABD: soft, obese, NT, +BS CNS: AAO x 3, non focal EXT: Chronic lymphedema bilateral legs, no tenderness     Pressure Injury 05/23/22 Buttocks Mid Stage 2 -  Partial thickness loss of dermis presenting as a shallow open injury with a red, pink wound bed without slough. (Active)  05/23/22 2000  Location: Buttocks  Location Orientation: Mid  Staging: Stage 2 -  Partial thickness loss of dermis presenting as a shallow open injury with a red, pink wound bed without slough.  Wound Description (Comments):   Present on Admission: Yes  Dressing Type Foam - Lift dressing to assess site every shift 05/24/22 2200     Data Reviewed:   I have personally reviewed following labs and imaging studies:  Labs: Labs show the following:   Basic Metabolic Panel: Recent Labs  Lab 05/22/22 1917 05/23/22 0450 05/24/22 0356  05/25/22 0402  NA 135 137 137 139  K 3.5 4.3 3.4* 3.7  CL 100 103 102 105  CO2 '27 26 27 26  '$ GLUCOSE 137* 122* 104* 81  BUN 23 24* 23 21  CREATININE 1.43* 1.41* 1.41* 1.36*  CALCIUM 8.8* 8.7* 8.7* 8.6*  MG  --   --  1.9 2.2   GFR Estimated Creatinine Clearance: 57.5 mL/min (A) (by C-G formula based on SCr of 1.36 mg/dL (H)). Liver Function Tests: Recent Labs  Lab 05/22/22 1917  AST 48*  ALT 46*  ALKPHOS 83  BILITOT 0.7  PROT 7.0  ALBUMIN 2.9*   No results for input(s): "LIPASE", "AMYLASE" in the last 168 hours. Recent Labs  Lab 05/22/22 1917  AMMONIA <10   Coagulation profile No results for input(s): "INR", "PROTIME" in the last 168 hours.  CBC: Recent Labs  Lab 05/22/22 1917 05/23/22 0450 05/24/22 0356  WBC 10.8* 9.6 7.0  NEUTROABS 8.9*  --   --   HGB 13.1 12.4* 12.4*  HCT 41.6 38.8* 37.9*  MCV 92.4 90.7 89.6  PLT 152 151 145*   Cardiac Enzymes: No results for input(s): "CKTOTAL", "CKMB", "CKMBINDEX", "TROPONINI" in the last 168 hours. BNP (last 3 results) No results for input(s): "PROBNP" in the last 8760 hours. CBG: Recent Labs  Lab 05/27/22 1153 05/27/22 1628 05/27/22 2039 05/28/22 0833 05/28/22 1205  GLUCAP 92 80 104* 77 110*   D-Dimer: No results for input(s): "DDIMER" in the last 72 hours. Hgb A1c: No results for input(s): "HGBA1C" in the last 72 hours. Lipid Profile: No results for input(s): "CHOL", "HDL", "LDLCALC", "TRIG", "CHOLHDL", "LDLDIRECT" in the last 72 hours. Thyroid function studies: No results for input(s): "TSH", "T4TOTAL", "T3FREE", "THYROIDAB" in the last 72 hours.  Invalid input(s): "FREET3" Anemia work up: No results for input(s): "VITAMINB12", "FOLATE", "FERRITIN", "TIBC", "IRON", "RETICCTPCT" in the last 72 hours. Sepsis Labs: Recent Labs  Lab 05/22/22 1917 05/23/22 0450 05/24/22 0356  WBC 10.8* 9.6 7.0    Microbiology Recent Results (from the past 240 hour(s))  Resp panel by RT-PCR (RSV, Flu A&B, Covid)  Anterior Nasal Swab     Status: None   Collection Time: 05/22/22  7:17 PM   Specimen: Anterior Nasal Swab  Result Value Ref Range Status   SARS Coronavirus 2 by RT PCR NEGATIVE NEGATIVE Final    Comment: (NOTE) SARS-CoV-2 target nucleic acids are NOT DETECTED.  The SARS-CoV-2 RNA is generally detectable in upper respiratory specimens during the acute phase of infection. The lowest concentration of SARS-CoV-2 viral copies this assay can detect is 138 copies/mL. A negative result does not preclude SARS-Cov-2 infection and should not be used as the sole basis for treatment or other patient management decisions. A negative result may occur with  improper specimen collection/handling, submission of specimen other than nasopharyngeal swab, presence of viral mutation(s) within the areas targeted by this assay, and inadequate number of viral copies(<138 copies/mL). A negative result must be combined with clinical observations, patient history, and epidemiological information. The expected result is Negative.  Fact Sheet for Patients:  EntrepreneurPulse.com.au  Fact Sheet for Healthcare Providers:  IncredibleEmployment.be  This test is no t yet approved or cleared by the [place] FDA and  has been authorized for detection and/or diagnosis of SARS-CoV-2 by FDA under an Emergency Use Authorization (EUA). This EUA will remain  in effect (meaning this test can be used) for the duration of the COVID-19 declaration under Section 564(b)(1) of the Act, 21 U.S.C.section 360bbb-3(b)(1), unless the authorization is terminated  or revoked sooner.       Influenza A by PCR NEGATIVE NEGATIVE Final   Influenza B by PCR NEGATIVE NEGATIVE Final    Comment: (NOTE) The Xpert Xpress SARS-CoV-2/FLU/RSV plus assay is intended as an aid in the diagnosis of influenza  from Nasopharyngeal swab specimens and should not be used as a sole basis for treatment. Nasal washings  and aspirates are unacceptable for Xpert Xpress SARS-CoV-2/FLU/RSV testing.  Fact Sheet for Patients: EntrepreneurPulse.com.au  Fact Sheet for Healthcare Providers: IncredibleEmployment.be  This test is not yet approved or cleared by the [place] FDA and has been authorized for detection and/or diagnosis of SARS-CoV-2 by FDA under an Emergency Use Authorization (EUA). This EUA will remain in effect (meaning this test can be used) for the duration of the COVID-19 declaration under Section 564(b)(1) of the Act, 21 U.S.C. section 360bbb-3(b)(1), unless the authorization is terminated or revoked.     Resp Syncytial Virus by PCR NEGATIVE NEGATIVE Final    Comment: (NOTE) Fact Sheet for Patients: EntrepreneurPulse.com.au  Fact Sheet for Healthcare Providers: IncredibleEmployment.be  This test is not yet approved or cleared by the [place] FDA and has been authorized for detection and/or diagnosis of SARS-CoV-2 by FDA under an Emergency Use Authorization (EUA). This EUA will remain in effect (meaning this test can be used) for the duration of the COVID-19 declaration under Section 564(b)(1) of the Act, 21 U.S.C. section 360bbb-3(b)(1), unless the authorization is terminated or revoked.  Performed at [facility], [address] [place], [place] [address]   Urine Culture     Status: Abnormal   Collection Time: 05/22/22  7:17 PM   Specimen: Urine, Random  Result Value Ref Range Status   Specimen Description   Final    URINE, RANDOM Performed at [facility], [address] [place], [place] [address]    Special Requests   Final    NONE Performed at [facility], [place], [place] [address]    Culture (A)  Final    >=100,000 COLONIES/mL ENTEROCOCCUS FAECALIS 10,000 COLONIES/mL ESCHERICHIA COLI    Report Status 05/25/2022 FINAL  Final   Organism ID,  Bacteria ENTEROCOCCUS FAECALIS (A)  Final   Organism ID, Bacteria ESCHERICHIA COLI (A)  Final      Susceptibility   Escherichia coli - MIC*    AMPICILLIN >=32 RESISTANT Resistant     CEFAZOLIN <=4 SENSITIVE Sensitive     CEFEPIME <=0.12 SENSITIVE Sensitive     CEFTRIAXONE <=0.25 SENSITIVE Sensitive     CIPROFLOXACIN >=4 RESISTANT Resistant     GENTAMICIN <=1 SENSITIVE Sensitive     IMIPENEM <=0.25 SENSITIVE Sensitive     NITROFURANTOIN <=16 SENSITIVE Sensitive     TRIMETH/SULFA >=320 RESISTANT Resistant     AMPICILLIN/SULBACTAM 16 INTERMEDIATE Intermediate     PIP/TAZO <=4 SENSITIVE Sensitive     * 10,000 COLONIES/mL ESCHERICHIA COLI   Enterococcus faecalis - MIC*    AMPICILLIN <=2 SENSITIVE Sensitive     NITROFURANTOIN <=16 SENSITIVE Sensitive     VANCOMYCIN 1 SENSITIVE Sensitive     * >=100,000 COLONIES/mL ENTEROCOCCUS FAECALIS    Procedures and diagnostic studies:  No results found.             LOS: 4 days   Erling Arrazola  [facility]   Pager on [email or web address]. If 7PM-7AM, please contact night-coverage at [email or web address]     05/28/2022, 2:53 PM

## 2022-05-29 DIAGNOSIS — N390 Urinary tract infection, site not specified: Secondary | ICD-10-CM | POA: Diagnosis not present

## 2022-05-29 DIAGNOSIS — R531 Weakness: Secondary | ICD-10-CM | POA: Diagnosis not present

## 2022-05-29 LAB — GLUCOSE, CAPILLARY
Glucose-Capillary: 100 mg/dL — ABNORMAL HIGH (ref 70–99)
Glucose-Capillary: 94 mg/dL (ref 70–99)
Glucose-Capillary: 106 mg/dL — ABNORMAL HIGH (ref 70–99)

## 2022-05-29 NOTE — Progress Notes (Signed)
Assumed pt care, pt with no complaints

## 2022-05-30 DIAGNOSIS — N390 Urinary tract infection, site not specified: Secondary | ICD-10-CM | POA: Diagnosis not present

## 2022-05-30 DIAGNOSIS — R531 Weakness: Secondary | ICD-10-CM | POA: Diagnosis not present

## 2022-05-30 LAB — RESP PANEL BY RT-PCR (RSV, FLU A&B, COVID)  RVPGX2
Influenza A by PCR: POSITIVE — AB
Influenza B by PCR: NEGATIVE
Resp Syncytial Virus by PCR: NEGATIVE
SARS Coronavirus 2 by RT PCR: NEGATIVE

## 2022-05-30 LAB — CBC WITH DIFFERENTIAL/PLATELET
Abs Immature Granulocytes: 0.03 10*3/uL (ref 0.00–0.07)
Basophils Absolute: 0 10*3/uL (ref 0.0–0.1)
Basophils Relative: 0 %
Eosinophils Absolute: 0.4 10*3/uL (ref 0.0–0.5)
Eosinophils Relative: 7 %
HCT: 36.6 % — ABNORMAL LOW (ref 39.0–52.0)
Hemoglobin: 11.7 g/dL — ABNORMAL LOW (ref 13.0–17.0)
Immature Granulocytes: 1 %
Lymphocytes Relative: 34 %
Lymphs Abs: 1.8 10*3/uL (ref 0.7–4.0)
MCH: 29.3 pg (ref 26.0–34.0)
MCHC: 32 g/dL (ref 30.0–36.0)
MCV: 91.5 fL (ref 80.0–100.0)
Monocytes Absolute: 0.5 10*3/uL (ref 0.1–1.0)
Monocytes Relative: 10 %
Neutro Abs: 2.6 10*3/uL (ref 1.7–7.7)
Neutrophils Relative %: 48 %
Platelets: 122 10*3/uL — ABNORMAL LOW (ref 150–400)
RBC: 4 MIL/uL — ABNORMAL LOW (ref 4.22–5.81)
RDW: 14 % (ref 11.5–15.5)
Smear Review: NORMAL
WBC: 5.2 10*3/uL (ref 4.0–10.5)
nRBC: 0 % (ref 0.0–0.2)

## 2022-05-30 LAB — BASIC METABOLIC PANEL
Anion gap: 8 (ref 5–15)
BUN: 22 mg/dL (ref 8–23)
CO2: 27 mmol/L (ref 22–32)
Calcium: 8.9 mg/dL (ref 8.9–10.3)
Chloride: 105 mmol/L (ref 98–111)
Creatinine, Ser: 1.18 mg/dL (ref 0.61–1.24)
GFR, Estimated: >60 mL/min (ref >60)
Glucose, Bld: 71 mg/dL (ref 70–99)
Potassium: 3.8 mmol/L (ref 3.5–5.1)
Sodium: 140 mmol/L (ref 135–145)

## 2022-05-30 LAB — MAGNESIUM: Magnesium: 2.3 mg/dL (ref 1.7–2.4)

## 2022-05-30 LAB — GLUCOSE, CAPILLARY
Glucose-Capillary: 76 mg/dL (ref 70–99)
Glucose-Capillary: 116 mg/dL — ABNORMAL HIGH (ref 70–99)
Glucose-Capillary: 109 mg/dL — ABNORMAL HIGH (ref 70–99)
Glucose-Capillary: 98 mg/dL (ref 70–99)

## 2022-05-30 LAB — PHOSPHORUS: Phosphorus: 3.5 mg/dL (ref 2.5–4.6)

## 2022-05-30 MED ORDER — OSELTAMIVIR PHOSPHATE 75 MG PO CAPS
75.0000 mg | ORAL_CAPSULE | Freq: Two times a day (BID) | ORAL | Status: AC
  Administered 2022-05-30 – 2022-06-04 (×10): 75 mg via ORAL
  Filled 2022-05-30 (×10): qty 1

## 2022-05-30 NOTE — Progress Notes (Addendum)
Progress Note    Adam Merritt  WFU:[id number] DOB: November 22, 1945  DOA: 05/22/2022 PCP: [name], MD      Brief Narrative:    Medical records reviewed and are as summarized below:  Adam Merritt is a 76 y.o. male  with medical history significant for Bell's palsy,  diabetes mellitus with complications of stage IIIa chronic kidney disease, coronary artery disease, chronic diastolic CHF, history of stroke, hypertension, chronic lymphedema, diabetic neuropathy, GERD, history of venous insufficiency with bilateral lower extremity ulcerations.  He presented to the hospital because of generalized weakness, syncope and fall without head injury.  He also complained of urinary frequency and dysuria.     Assessment/Plan:   Principal Problem:   Syncope Active Problems:   Acute lower UTI   Dyslipidemia   Type 2 diabetes mellitus with peripheral neuropathy (HCC)   Fall   Weakness   Malnutrition of moderate degree    Body mass index is 48.09 kg/m.  (Morbid obesity)  Acute UTI: Urine culture showed Enterococcus faecalis and E. coli.  Plan to complete 7-day course of nitrofurantoin tomorrow.  Renal ultrasound did not show any hydronephrosis, nephrolithiasis.  Post void residual urine on 05/24/2022  was 136 mls.  He is voiding spontaneously   S/p syncope: Probably from general weakness   Generalized weakness: PT and OT recommended discharge to SNF.  Follow-up with social worker to assist with disposition   Hypokalemia: Improved.   Cough: Ordered influenza and COVID test.   Tremors of bilateral upper extremities: his wife said he was supposed to see a neurologist at [facility] in November, 2023. Follow up with neurologist as an outpatient  Other comorbidities include CAD, type II DM, CKD stage IIIa, peripheral neuropathy, hyperlipidemia, chronic lymphedema lower extremities, history of stroke, chronic diastolic CHF (2D echo in August 2021 showed normal EF)    Diet  Order             Diet 2 gram sodium Fluid consistency: Thin  Diet effective now                            Consultants: None  Procedures: None    Medications:    ascorbic acid  500 mg Oral BID   aspirin EC  325 mg Oral Daily   atorvastatin  40 mg Oral QHS   carvedilol  25 mg Oral BID WC   vitamin D3  1,000 Units Oral Daily   cyanocobalamin  1,000 mcg Oral Daily   docusate  50 mg Oral BID   enoxaparin (LOVENOX) injection  0.5 mg/kg Subcutaneous Q24H   feeding supplement  237 mL Oral TID BM   folic acid  1 mg Oral Daily   gabapentin  300 mg Oral BID   insulin aspart  0-9 Units Subcutaneous TID WC   insulin glargine-yfgn  30 Units Subcutaneous Daily   iron polysaccharides  150 mg Oral Daily   multivitamin with minerals  1 tablet Oral Daily   nitrofurantoin (macrocrystal-monohydrate)  100 mg Oral Q12H   pantoprazole  40 mg Oral Daily   pentoxifylline  400 mg Oral TID WC   prazosin  1 mg Oral QHS   sertraline  100 mg Oral Daily   traZODone  75 mg Oral QHS   zinc sulfate  220 mg Oral Daily   Continuous Infusions:     Anti-infectives (From admission, onward)    Start  Dose/Rate Route Frequency Ordered Stop   05/25/22 1500  nitrofurantoin (macrocrystal-monohydrate) (MACROBID) capsule 100 mg        100 mg Oral Every 12 hours 05/25/22 1412 06/01/22 0959   05/24/22 0200  cefTRIAXone (ROCEPHIN) 2 g in sodium chloride 0.9 % 100 mL IVPB  Status:  Discontinued        2 g 200 mL/hr over 30 Minutes Intravenous Every 24 hours 05/23/22 0217 05/25/22 1412   05/23/22 0230  cefTRIAXone (ROCEPHIN) 1 g in sodium chloride 0.9 % 100 mL IVPB        1 g 200 mL/hr over 30 Minutes Intravenous  Once 05/23/22 0229 05/23/22 0448   05/23/22 0130  cefTRIAXone (ROCEPHIN) 1 g in sodium chloride 0.9 % 100 mL IVPB        1 g 200 mL/hr over 30 Minutes Intravenous  Once 05/23/22 0122 05/23/22 0214              Family Communication/Anticipated D/C date and plan/Code  Status   DVT prophylaxis:      Code Status: Full Code  Family Communication: Plan discussed with his wife at the bedside Disposition Plan: SNF at discharge   Status is: Inpatient Remains inpatient appropriate because: Needs SNF at discharge          Subjective:   C/o cough.  No other complaints.  No shortness of breath or chest pain.  Objective:     Vitals:   05/29/22 1618 05/29/22 2000 05/30/22 0441 05/30/22 0741  BP:  (!) 114/54 (!) 121/51 (!) 125/56  Pulse: 60 (!) 57 (!) 54 (!) 53  Resp:  '20 18 16  '$ Temp:  98 F (36.7 C) 97.8 F (36.6 C) 98.7 F (37.1 C)  TempSrc:  Oral    SpO2:  98% 98% 97%  Weight:      Height:        No data found.    Intake/Output Summary (Last 24 hours) at 05/30/2022 1511 Last data filed at 05/29/2022 1713 Gross per 24 hour  Intake --  Output 600 ml  Net -600 ml    Filed Weights   05/22/22 1913  Weight: 131.1 kg    Exam:   GEN: NAD SKIN: Warm and dry EYES: Anicteric ENT: MMM CV: RRR PULM: CTA B ABD: soft, obese, NT, +BS CNS: AAO x 3, non focal EXT: Chronic  lymphedema b/l legs, no tenderness    Pressure Injury 05/23/22 Buttocks Mid Stage 2 -  Partial thickness loss of dermis presenting as a shallow open injury with a red, pink wound bed without slough. (Active)  05/23/22 2000  Location: Buttocks  Location Orientation: Mid  Staging: Stage 2 -  Partial thickness loss of dermis presenting as a shallow open injury with a red, pink wound bed without slough.  Wound Description (Comments):   Present on Admission: Yes  Dressing Type Foam - Lift dressing to assess site every shift 05/24/22 2200     Data Reviewed:   I have personally reviewed following labs and imaging studies:  Labs: Labs show the following:   Basic Metabolic Panel: Recent Labs  Lab 05/24/22 0356 05/25/22 0402 05/30/22 0747  NA 137 139 140  K 3.4* 3.7 3.8  CL 102 105 105  CO2 '27 26 27  '$ GLUCOSE 104* 81 71  BUN '23 21 22  '$ CREATININE  1.41* 1.36* 1.18  CALCIUM 8.7* 8.6* 8.9  MG 1.9 2.2 2.3  PHOS  --   --  3.5   GFR Estimated  Creatinine Clearance: 66.2 mL/min (by C-G formula based on SCr of 1.18 mg/dL). Liver Function Tests: No results for input(s): "AST", "ALT", "ALKPHOS", "BILITOT", "PROT", "ALBUMIN" in the last 168 hours.  No results for input(s): "LIPASE", "AMYLASE" in the last 168 hours. No results for input(s): "AMMONIA" in the last 168 hours.  Coagulation profile No results for input(s): "INR", "PROTIME" in the last 168 hours.  CBC: Recent Labs  Lab 05/24/22 0356 05/30/22 0747  WBC 7.0 5.2  NEUTROABS  --  2.6  HGB 12.4* 11.7*  HCT 37.9* 36.6*  MCV 89.6 91.5  PLT 145* 122*   Cardiac Enzymes: No results for input(s): "CKTOTAL", "CKMB", "CKMBINDEX", "TROPONINI" in the last 168 hours. BNP (last 3 results) No results for input(s): "PROBNP" in the last 8760 hours. CBG: Recent Labs  Lab 05/29/22 1121 05/29/22 1618 05/29/22 2150 05/30/22 0740 05/30/22 1147  GLUCAP 100* 94 106* 76 116*   D-Dimer: No results for input(s): "DDIMER" in the last 72 hours. Hgb A1c: No results for input(s): "HGBA1C" in the last 72 hours. Lipid Profile: No results for input(s): "CHOL", "HDL", "LDLCALC", "TRIG", "CHOLHDL", "LDLDIRECT" in the last 72 hours. Thyroid function studies: No results for input(s): "TSH", "T4TOTAL", "T3FREE", "THYROIDAB" in the last 72 hours.  Invalid input(s): "FREET3" Anemia work up: No results for input(s): "VITAMINB12", "FOLATE", "FERRITIN", "TIBC", "IRON", "RETICCTPCT" in the last 72 hours. Sepsis Labs: Recent Labs  Lab 05/24/22 0356 05/30/22 0747  WBC 7.0 5.2    Microbiology Recent Results (from the past 240 hour(s))  Resp panel by RT-PCR (RSV, Flu A&B, Covid) Anterior Nasal Swab     Status: None   Collection Time: 05/22/22  7:17 PM   Specimen: Anterior Nasal Swab  Result Value Ref Range Status   SARS Coronavirus 2 by RT PCR NEGATIVE NEGATIVE Final    Comment:  (NOTE) SARS-CoV-2 target nucleic acids are NOT DETECTED.  The SARS-CoV-2 RNA is generally detectable in upper respiratory specimens during the acute phase of infection. The lowest concentration of SARS-CoV-2 viral copies this assay can detect is 138 copies/mL. A negative result does not preclude SARS-Cov-2 infection and should not be used as the sole basis for treatment or other patient management decisions. A negative result may occur with  improper specimen collection/handling, submission of specimen other than nasopharyngeal swab, presence of viral mutation(s) within the areas targeted by this assay, and inadequate number of viral copies(<138 copies/mL). A negative result must be combined with clinical observations, patient history, and epidemiological information. The expected result is Negative.  Fact Sheet for Patients:  EntrepreneurPulse.com.au  Fact Sheet for Healthcare Providers:  IncredibleEmployment.be  This test is no t yet approved or cleared by the [place] FDA and  has been authorized for detection and/or diagnosis of SARS-CoV-2 by FDA under an Emergency Use Authorization (EUA). This EUA will remain  in effect (meaning this test can be used) for the duration of the COVID-19 declaration under Section 564(b)(1) of the Act, 21 U.S.C.section 360bbb-3(b)(1), unless the authorization is terminated  or revoked sooner.       Influenza A by PCR NEGATIVE NEGATIVE Final   Influenza B by PCR NEGATIVE NEGATIVE Final    Comment: (NOTE) The Xpert Xpress SARS-CoV-2/FLU/RSV plus assay is intended as an aid in the diagnosis of influenza from Nasopharyngeal swab specimens and should not be used as a sole basis for treatment. Nasal washings and aspirates are unacceptable for Xpert Xpress SARS-CoV-2/FLU/RSV testing.  Fact Sheet for Patients: EntrepreneurPulse.com.au  Fact Sheet for Healthcare  Providers: IncredibleEmployment.be  This test is not yet approved or cleared by the [place] and has been authorized for detection and/or diagnosis of SARS-CoV-2 by FDA under an Emergency Use Authorization (EUA). This EUA will remain in effect (meaning this test can be used) for the duration of the COVID-19 declaration under Section 564(b)(1) of the Act, 21 U.S.C. section 360bbb-3(b)(1), unless the authorization is terminated or revoked.     Resp Syncytial Virus by PCR NEGATIVE NEGATIVE Final    Comment: (NOTE) Fact Sheet for Patients: EntrepreneurPulse.com.au  Fact Sheet for Healthcare Providers: IncredibleEmployment.be  This test is not yet approved or cleared by the [place] FDA and has been authorized for detection and/or diagnosis of SARS-CoV-2 by FDA under an Emergency Use Authorization (EUA). This EUA will remain in effect (meaning this test can be used) for the duration of the COVID-19 declaration under Section 564(b)(1) of the Act, 21 U.S.C. section 360bbb-3(b)(1), unless the authorization is terminated or revoked.  Performed at [facility], [address] [place], [place] [address]   Urine Culture     Status: Abnormal   Collection Time: 05/22/22  7:17 PM   Specimen: Urine, Random  Result Value Ref Range Status   Specimen Description   Final    URINE, RANDOM Performed at [facility], [address] [place], [place] [address]    Special Requests   Final    NONE Performed at [facility], [place],  [id number]    Culture (A)  Final    >=100,000 COLONIES/mL ENTEROCOCCUS FAECALIS 10,000 COLONIES/mL ESCHERICHIA COLI    Report Status 05/25/2022 FINAL  Final   Organism ID, Bacteria ENTEROCOCCUS FAECALIS (A)  Final   Organism ID, Bacteria ESCHERICHIA COLI (A)  Final      Susceptibility   Escherichia coli - MIC*    AMPICILLIN >=32 RESISTANT  Resistant     CEFAZOLIN <=4 SENSITIVE Sensitive     CEFEPIME <=0.12 SENSITIVE Sensitive     CEFTRIAXONE <=0.25 SENSITIVE Sensitive     CIPROFLOXACIN >=4 RESISTANT Resistant     GENTAMICIN <=1 SENSITIVE Sensitive     IMIPENEM <=0.25 SENSITIVE Sensitive     NITROFURANTOIN <=16 SENSITIVE Sensitive     TRIMETH/SULFA >=320 RESISTANT Resistant     AMPICILLIN/SULBACTAM 16 INTERMEDIATE Intermediate     PIP/TAZO <=4 SENSITIVE Sensitive     * 10,000 COLONIES/mL ESCHERICHIA COLI   Enterococcus faecalis - MIC*    AMPICILLIN <=2 SENSITIVE Sensitive     NITROFURANTOIN <=16 SENSITIVE Sensitive     VANCOMYCIN 1 SENSITIVE Sensitive     * >=100,000 COLONIES/mL ENTEROCOCCUS FAECALIS    Procedures and diagnostic studies:  No results found.             LOS: 6 days   Esker Dever  [facility]   Pager on [email or web address]. If 7PM-7AM, please contact night-coverage at [email or web address]     05/30/2022, 3:11 PM

## 2022-05-30 NOTE — Consult Note (Addendum)
[facility] Nurse Consult Note: Reason for Consult: bilateral Unna's boot application. Patient with bilateral lymphedema and no wounds. Wound type:lymphedema Pressure Injury POA: N/A Measurement:N/A Wound bed:N/A Drainage (amount, consistency, odor) None Periwound:with lichenification Dressing procedure/placement/frequency: LEs are washed and moisturized. Unna's boots are applied with the assistance of the Nursing Technician: Unna's Boot, conform bandaging, Coban. Patient expressed appreciation for change and reports that LEs "feel better". Next change will be on Thursday, 06/01/22 if patient still in house.  [facility] nursing team will follow, and will remain available to this patient, the nursing and medical teams.    Thank you for inviting [place] to participate in this patient's Plan of Care.  Maudie Flakes, MSN, RN, CNS, [place], Serita Grammes, [employer], [employer] phone:  [phone]

## 2022-05-30 NOTE — Progress Notes (Signed)
Physical Therapy Treatment Patient Details Name: Adam Merritt MRN: [id number] DOB: 14-Mar-1946 Today's Date: 05/30/2022   History of Present Illness Hayze Gazda is a 25yoM who comes to [facility] 12/18 after a fall. PMH includes: DM2, CKD 3a, CAD, HTN, lymphedema, diabetic neuropathy, GERD, venous insufficiency with BLE ulceratons. 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 He has more recent BUE tremor in past few months. MD assessment includes: acute UTI, syncope, generalized weakness, and hypokalemia.    PT Comments    Pt was pleasant and motivated to participate during the session and put forth good effort throughout. Pt continues to present with profound functional weakness and required +2 total assist with all bed mobility tasks and presented with poor static sitting balance.  Pt performed 5 max effort sit to stand attempts with heavy +2 assist that primarily functioned as therex as pt was only able to minimally unweight himself from the EOB and was not close to clearing the surface of the mattress.  Pt reported no adverse symptoms during the session other than BLE pain with SpO2 and HR WNL.  Pt will benefit from PT services in a SNF setting upon discharge to safely address deficits listed in patient problem list for decreased caregiver assistance and eventual return to PLOF.     Recommendations for follow up therapy are one component of a multi-disciplinary discharge planning process, led by the attending physician.  Recommendations may be updated based on patient status, additional functional criteria and insurance authorization.  Follow Up Recommendations  Skilled nursing-short term rehab (<3 hours/day) Can patient physically be transported by private vehicle: No   Assistance Recommended at Discharge Frequent or constant Supervision/Assistance  Patient can return home with the following Two people to help with walking and/or transfers;Two people to help with bathing/dressing/bathroom;Assistance  with cooking/housework;Assistance with feeding;Direct supervision/assist for medications management;Assist for transportation;Help with stairs or ramp for entrance   Equipment Recommendations  Other (comment) (TBD at  next venue of care)    Recommendations for Other Services       Precautions / Restrictions Precautions Precautions: Fall Precaution Comments: Una boots bilaterally Restrictions Weight Bearing Restrictions: No     Mobility  Bed Mobility Overal bed mobility: Needs Assistance Bed Mobility: Supine to Sit, Sit to Supine, Rolling Rolling: Max assist, +2 for physical assistance   Supine to sit: Max assist, +2 for physical assistance Sit to supine: Max assist, +2 for physical assistance   General bed mobility comments: Heavy +2 assist for BLE and trunk control    Transfers Overall transfer level: Needs assistance Equipment used: Rolling walker (2 wheels)   Sit to Stand: Total assist, +2 physical assistance, From elevated surface           General transfer comment: Five attempts to stand with pt able to minimally unweight his bottom from the bed but unable to clear the surface    Ambulation/Gait               General Gait Details: Unable   Stairs             Wheelchair Mobility    Modified Rankin (Stroke Patients Only)       Balance Overall balance assessment: Needs assistance Sitting-balance support: Feet supported, Bilateral upper extremity supported Sitting balance-Leahy Scale: Poor Sitting balance - Comments: Occasional min A for sitting balance with pt losing balance in all planes but primarily anteriorly       Standing balance comment: Unable to stand  Cognition Arousal/Alertness: Awake/alert Behavior During Therapy: WFL for tasks assessed/performed Overall Cognitive Status: Within Functional Limits for tasks assessed                                          Exercises  Total Joint Exercises Ankle Circles/Pumps: AROM, Strengthening, Both, 5 reps, 10 reps (with manual resistance) Quad Sets: Strengthening, Both, 10 reps, 15 reps Gluteal Sets: Strengthening, Both, 10 reps Hip ABduction/ADduction: AAROM, Strengthening, Both, 10 reps Straight Leg Raises: AAROM, Strengthening, Both, 10 reps Long Arc Quad: Strengthening, Both, 10 reps Knee Flexion: Strengthening, Both, 10 reps Other Exercises: posterior weight shifting in sitting to address anterior lean Other Exercises: Max effort attempts to stand x 5 Other Exercises: HEP education/review for BLE APs, QS, and GS x 10 each every 1-2 hours daily    General Comments        Pertinent Vitals/Pain Pain Assessment Pain Assessment: 0-10 Pain Score: 4  Pain Location: BLE's Pain Descriptors / Indicators: Aching, Sore Pain Intervention(s): Repositioned, Premedicated before session, Monitored during session    Home Living                          Prior Function            PT Goals (current goals can now be found in the care plan section) Progress towards PT goals: Not progressing toward goals - comment (Profound functional weakness limiting progress)    Frequency    Min 2X/week      PT Plan Current plan remains appropriate    Co-evaluation              AM-PAC PT "6 Clicks" Mobility   Outcome Measure  Help needed turning from your back to your side while in a flat bed without using bedrails?: Total Help needed moving from lying on your back to sitting on the side of a flat bed without using bedrails?: Total Help needed moving to and from a bed to a chair (including a wheelchair)?: Total Help needed standing up from a chair using your arms (e.g., wheelchair or bedside chair)?: Total Help needed to walk in hospital room?: Total Help needed climbing 3-5 steps with a railing? : Total 6 Click Score: 6    End of Session Equipment Utilized During Treatment: Gait belt Activity  Tolerance: Patient tolerated treatment well Patient left: in bed;with call bell/phone within reach;with bed alarm set;with family/visitor present Nurse Communication: Mobility status PT Visit Diagnosis: Other abnormalities of gait and mobility (R26.89);Muscle weakness (generalized) (M62.81)     Time: 6314-9702 PT Time Calculation (min) (ACUTE ONLY): 24 min  Charges:  $Therapeutic Exercise: 8-22 mins $Therapeutic Activity: 8-22 mins                    D. Scott Berlin Viereck PT, DPT 05/30/22, 3:14 PM

## 2022-05-31 DIAGNOSIS — L8990 Pressure ulcer of unspecified site, unspecified stage: Secondary | ICD-10-CM | POA: Insufficient documentation

## 2022-05-31 DIAGNOSIS — N390 Urinary tract infection, site not specified: Secondary | ICD-10-CM | POA: Diagnosis not present

## 2022-05-31 DIAGNOSIS — J101 Influenza due to other identified influenza virus with other respiratory manifestations: Secondary | ICD-10-CM | POA: Diagnosis not present

## 2022-05-31 DIAGNOSIS — R531 Weakness: Secondary | ICD-10-CM | POA: Diagnosis not present

## 2022-05-31 LAB — GLUCOSE, CAPILLARY
Glucose-Capillary: 102 mg/dL — ABNORMAL HIGH (ref 70–99)
Glucose-Capillary: 145 mg/dL — ABNORMAL HIGH (ref 70–99)
Glucose-Capillary: 107 mg/dL — ABNORMAL HIGH (ref 70–99)
Glucose-Capillary: 78 mg/dL (ref 70–99)

## 2022-05-31 NOTE — Progress Notes (Signed)
Occupational Therapy Treatment Patient Details Name: Adam Merritt MRN: [id number] DOB: 11/02/1945 Today's Date: 05/31/2022   History of present illness Adam Merritt is a 46yoM who comes to [facility] 12/[facility] after a fall. PMH includes: DM2, CKD 3a, CAD, HTN, lymphedema, diabetic neuropathy, GERD, venous insufficiency with BLE ulceratons. 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 He has more recent BUE tremor in past few months. MD assessment includes: acute UTI, syncope, generalized weakness, and hypokalemia.   OT comments  Chart reviewed, pt greeted in bed agreeable to OT tx session. Tx session targeted improving functional activity tolerance, specifically sitting balance in preparation for toilet transfers, improved ADL task completion. Improvements noted in bed nobility supine>sit requiring MAX A +1 with HOB raised, rolling with MAX A +1, use of bed features. MAX A +2 continues to be required fro sit>supine. MIN A required for feeding/grooming tasks seated at edge of bed. Poor dynamic sitting balance noted requiring CGA-MIN A, MOD A to reposition approx with R lateral lean throughout during ADL tasks. Seated on edge of bed for approx 18 minutes. Pt is making slow progress towards goals, discharge recommendation remains appropriate. Pt left with nurse in room, in chair position. Educated on importance of progressing mobility/ADL participation. OT will continue to follow acutely.    Recommendations for follow up therapy are one component of a multi-disciplinary discharge planning process, led by the attending physician.  Recommendations may be updated based on patient status, additional functional criteria and insurance authorization.    Follow Up Recommendations  Skilled nursing-short term rehab (<3 hours/day)     Assistance Recommended at Discharge Frequent or constant Supervision/Assistance  Patient can return home with the following  Two people to help with walking and/or transfers;Two people to help  with bathing/dressing/bathroom;Assistance with cooking/housework;Assist for transportation;Help with stairs or ramp for entrance;Assistance with feeding   Equipment Recommendations  Other (comment) (per next venue of care)    Recommendations for Other Services      Precautions / Restrictions Precautions Precautions: Fall Restrictions Weight Bearing Restrictions: No       Mobility Bed Mobility Overal bed mobility: Needs Assistance Bed Mobility: Supine to Sit, Sit to Supine, Rolling Rolling: Max assist   Supine to sit: Max assist, HOB elevated (with use of bed features, pt able to reach toward bed rail to assit with trunk) Sit to supine: Max assist, +2 for physical assistance                           Balance Overall balance assessment: Needs assistance Sitting-balance support: Feet supported, Bilateral upper extremity supported Sitting balance-Leahy Scale: Poor Sitting balance - Comments: CGA-MIN A throughout for dynamic sitting balace, CGA-MIN A for static sitting balance with MOD A to re correct on approx 5 occurences Postural control: Right lateral lean                                 ADL either performed or assessed with clinical judgement   ADL Overall ADL's : Needs assistance/impaired Eating/Feeding: Bed level;Minimal assistance   Grooming: Wash/dry face;Sitting;Minimal assistance               Lower Body Dressing: Maximal assistance Lower Body Dressing Details (indicate cue type and reason): socks at edge of bed                    Extremity/Trunk Assessment  Cognition Arousal/Alertness: Awake/alert Behavior During Therapy: WFL for tasks assessed/performed Overall Cognitive Status: Within Functional Limits for tasks assessed                                 General Comments: 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        Exercises      Shoulder Instructions       General Comments BLEs  wrapped, vital signs monitored throughout appear stable    Pertinent Vitals/ Pain       Pain Assessment Pain Assessment: Faces Faces Pain Scale: Hurts a little bit Pain Location: generalized Pain Descriptors / Indicators: Aching, Sore Pain Intervention(s): Limited activity within patient's tolerance, Monitored during session, Repositioned  Home Living                                          Prior Functioning/Environment              Frequency  Min 2X/week        Progress Toward Goals  OT Goals(current goals can now be found in the care plan section)  Progress towards OT goals: Progressing toward goals     Plan Discharge plan remains appropriate;Frequency remains appropriate    Co-evaluation                 AM-PAC OT "6 Clicks" Daily Activity     Outcome Measure   Help from another person eating meals?: A Lot Help from another person taking care of personal grooming?: A Lot Help from another person toileting, which includes using toliet, bedpan, or urinal?: Total Help from another person bathing (including washing, rinsing, drying)?: A Lot Help from another person to put on and taking off regular upper body clothing?: A Lot Help from another person to put on and taking off regular lower body clothing?: A Lot 6 Click Score: 11    End of Session Equipment Utilized During Treatment: Oxygen      Activity Tolerance Patient tolerated treatment well   Patient Left in bed;with call bell/phone within reach;with nursing/sitter in room   Nurse Communication Mobility status        Time: 9628-3662 OT Time Calculation (min): 24 min  Charges: OT General Charges $OT Visit: 1 Visit OT Treatments $Self Care/Home Management : 8-22 mins $Therapeutic Activity: 8-22 mins  Shanon Payor, OTD OTR/L  05/31/22, 9:40 AM

## 2022-05-31 NOTE — Progress Notes (Addendum)
Late entry note       Progress Note    Adam Merritt  STM:[id number] DOB: June 25, 1945  DOA: 05/22/2022 PCP: [name], MD      Brief Narrative:    Medical records reviewed and are as summarized below:  Adam Merritt is a 76 y.o. male  with medical history significant for Bell's palsy,  diabetes mellitus with complications of stage IIIa chronic kidney disease, coronary artery disease, chronic diastolic CHF, history of stroke, hypertension, chronic lymphedema, diabetic neuropathy, GERD, history of venous insufficiency with bilateral lower extremity ulcerations.  He presented to the hospital because of generalized weakness, syncope and fall without head injury.  He also complained of urinary frequency and dysuria.     Assessment/Plan:   Principal Problem:   Syncope Active Problems:   Acute lower UTI   Dyslipidemia   Type 2 diabetes mellitus with peripheral neuropathy (HCC)   Fall   Weakness   Malnutrition of moderate degree   Pressure injury of skin    Body mass index is 48.09 kg/m.  (Morbid obesity)  Acute UTI: Urine culture showed Enterococcus faecalis and E. coli.  Continue nitrofurantoin through 05/31/2022.  Renal ultrasound did not show any hydronephrosis, nephrolithiasis.  Post void residual urine on 05/24/2022  was 136 mls   S/p syncope: Probably from general weakness   Generalized weakness: Continue PT and OT while inpatient.  Awaiting placement to SNF   Hypokalemia: Improved.   Tremors of bilateral upper extremities: his wife said he was supposed to see a neurologist at [facility] in November, 2023. Follow up with neurologist as an outpatient  Other comorbidities include CAD, type II DM, CKD stage IIIa, peripheral neuropathy, hyperlipidemia, chronic lymphedema lower extremities, history of stroke, chronic diastolic CHF (2D echo in August 2021 showed normal EF)    Diet Order             Diet 2 gram sodium Fluid consistency: Thin  Diet  effective now                            Consultants: None  Procedures: None    Family Communication/Anticipated D/C date and plan/Code Status   DVT prophylaxis:      Code Status: Full Code  Family Communication: None Disposition Plan: SNF at discharge   Status is: Inpatient Remains inpatient appropriate because: Needs SNF at discharge          Subjective:   No acute events overnight.  No shortness of breath or chest pain.  He feels okay.  Appetite is good   Objective:   Vital signs are stable   No data found.    Intake/Output Summary (Last 24 hours) at 05/31/2022 1630 Last data filed at 05/30/2022 2245 Gross per 24 hour  Intake 300 ml  Output 800 ml  Net -500 ml    Filed Weights   05/22/22 1913  Weight: 131.1 kg    Exam:  GEN: NAD SKIN: Warm and dry EYES: No pallor ENT: MMM CV: RRR PULM: CTA B ABD: soft, obese, NT, +BS CNS: AAO x 3, non focal EXT: No tenderness or erythema    Data Reviewed:   I have personally reviewed following labs and imaging studies:  Labs: Previous labs reviewed    GFR Estimated Creatinine Clearance: 66.2 mL/min (by C-G formula based on SCr of 1.18 mg/dL). Liver Function Tests: No results for input(s): "AST", "ALT", "ALKPHOS", "BILITOT", "PROT", "ALBUMIN" in the  last 168 hours.  No results for input(s): "LIPASE", "AMYLASE" in the last 168 hours. No results for input(s): "AMMONIA" in the last 168 hours.  Coagulation profile No results for input(s): "INR", "PROTIME" in the last 168 hours.    Cardiac Enzymes: No results for input(s): "CKTOTAL", "CKMB", "CKMBINDEX", "TROPONINI" in the last 168 hours. BNP (last 3 results) No results for input(s): "PROBNP" in the last 8760 hours. CBG:  D-Dimer: No results for input(s): "DDIMER" in the last 72 hours. Hgb A1c: No results for input(s): "HGBA1C" in the last 72 hours. Lipid Profile: No results for input(s): "CHOL", "HDL", "LDLCALC",  "TRIG", "CHOLHDL", "LDLDIRECT" in the last 72 hours. Thyroid function studies: No results for input(s): "TSH", "T4TOTAL", "T3FREE", "THYROIDAB" in the last 72 hours.  Invalid input(s): "FREET3" Anemia work up: No results for input(s): "VITAMINB12", "FOLATE", "FERRITIN", "TIBC", "IRON", "RETICCTPCT" in the last 72 hours. Sepsis Labs: Recent Labs  Lab 05/30/22 0747  WBC 5.2    Microbiology Recent Results (from the past 240 hour(s))  Resp panel by RT-PCR (RSV, Flu A&B, Covid) Anterior Nasal Swab     Status: None   Collection Time: 05/22/22  7:17 PM   Specimen: Anterior Nasal Swab  Result Value Ref Range Status   SARS Coronavirus 2 by RT PCR NEGATIVE NEGATIVE Final    Comment: (NOTE) SARS-CoV-2 target nucleic acids are NOT DETECTED.  The SARS-CoV-2 RNA is generally detectable in upper respiratory specimens during the acute phase of infection. The lowest concentration of SARS-CoV-2 viral copies this assay can detect is 138 copies/mL. A negative result does not preclude SARS-Cov-2 infection and should not be used as the sole basis for treatment or other patient management decisions. A negative result may occur with  improper specimen collection/handling, submission of specimen other than nasopharyngeal swab, presence of viral mutation(s) within the areas targeted by this assay, and inadequate number of viral copies(<138 copies/mL). A negative result must be combined with clinical observations, patient history, and epidemiological information. The expected result is Negative.  Fact Sheet for Patients:  EntrepreneurPulse.com.au  Fact Sheet for Healthcare Providers:  IncredibleEmployment.be  This test is no t yet approved or cleared by the [place] FDA and  has been authorized for detection and/or diagnosis of SARS-CoV-2 by FDA under an Emergency Use Authorization (EUA). This EUA will remain  in effect (meaning this test can be used) for  the duration of the COVID-19 declaration under Section 564(b)(1) of the Act, 21 U.S.C.section 360bbb-3(b)(1), unless the authorization is terminated  or revoked sooner.       Influenza A by PCR NEGATIVE NEGATIVE Final   Influenza B by PCR NEGATIVE NEGATIVE Final    Comment: (NOTE) The Xpert Xpress SARS-CoV-2/FLU/RSV plus assay is intended as an aid in the diagnosis of influenza from Nasopharyngeal swab specimens and should not be used as a sole basis for treatment. Nasal washings and aspirates are unacceptable for Xpert Xpress SARS-CoV-2/FLU/RSV testing.  Fact Sheet for Patients: EntrepreneurPulse.com.au  Fact Sheet for Healthcare Providers: IncredibleEmployment.be  This test is not yet approved or cleared by the [place] FDA and has been authorized for detection and/or diagnosis of SARS-CoV-2 by FDA under an Emergency Use Authorization (EUA). This EUA will remain in effect (meaning this test can be used) for the duration of the COVID-19 declaration under Section 564(b)(1) of the Act, 21 U.S.C. section 360bbb-3(b)(1), unless the authorization is terminated or revoked.     Resp Syncytial Virus by PCR NEGATIVE NEGATIVE Final    Comment: (NOTE) Fact  Sheet for Patients: EntrepreneurPulse.com.au  Fact Sheet for Healthcare Providers: IncredibleEmployment.be  This test is not yet approved or cleared by the [place] FDA and has been authorized for detection and/or diagnosis of SARS-CoV-2 by FDA under an Emergency Use Authorization (EUA). This EUA will remain in effect (meaning this test can be used) for the duration of the COVID-19 declaration under Section 564(b)(1) of the Act, 21 U.S.C. section 360bbb-3(b)(1), unless the authorization is terminated or revoked.  Performed at [facility], [address] [place], [place] [address]   Urine Culture     Status: Abnormal   Collection  Time: 05/22/22  7:17 PM   Specimen: Urine, Random  Result Value Ref Range Status   Specimen Description   Final    URINE, RANDOM Performed at [facility], [address] [place], [place] [address]    Special Requests   Final    NONE Performed at [facility], [address] [place],  [address]    Culture (A)  Final    >=100,000 COLONIES/mL ENTEROCOCCUS FAECALIS 10,000 COLONIES/mL ESCHERICHIA COLI    Report Status 05/25/2022 FINAL  Final   Organism ID, Bacteria ENTEROCOCCUS FAECALIS (A)  Final   Organism ID, Bacteria ESCHERICHIA COLI (A)  Final      Susceptibility   Escherichia coli - MIC*    AMPICILLIN >=32 RESISTANT Resistant     CEFAZOLIN <=4 SENSITIVE Sensitive     CEFEPIME <=0.12 SENSITIVE Sensitive     CEFTRIAXONE <=0.25 SENSITIVE Sensitive     CIPROFLOXACIN >=4 RESISTANT Resistant     GENTAMICIN <=1 SENSITIVE Sensitive     IMIPENEM <=0.25 SENSITIVE Sensitive     NITROFURANTOIN <=16 SENSITIVE Sensitive     TRIMETH/SULFA >=320 RESISTANT Resistant     AMPICILLIN/SULBACTAM 16 INTERMEDIATE Intermediate     PIP/TAZO <=4 SENSITIVE Sensitive     * 10,000 COLONIES/mL ESCHERICHIA COLI   Enterococcus faecalis - MIC*    AMPICILLIN <=2 SENSITIVE Sensitive     NITROFURANTOIN <=16 SENSITIVE Sensitive     VANCOMYCIN 1 SENSITIVE Sensitive     * >=100,000 COLONIES/mL ENTEROCOCCUS FAECALIS  Resp panel by RT-PCR (RSV, Flu A&B, Covid) Anterior Nasal Swab     Status: Abnormal   Collection Time: 05/30/22  4:02 PM   Specimen: Anterior Nasal Swab  Result Value Ref Range Status   SARS Coronavirus 2 by RT PCR NEGATIVE NEGATIVE Final    Comment: (NOTE) SARS-CoV-2 target nucleic acids are NOT DETECTED.  The SARS-CoV-2 RNA is generally detectable in upper respiratory specimens during the acute phase of infection. The lowest concentration of SARS-CoV-2 viral copies this assay can detect is 138 copies/mL. A negative result does not preclude SARS-Cov-2 infection  and should not be used as the sole basis for treatment or other patient management decisions. A negative result may occur with  improper specimen collection/handling, submission of specimen other than nasopharyngeal swab, presence of viral mutation(s) within the areas targeted by this assay, and inadequate number of viral copies(<138 copies/mL). A negative result must be combined with clinical observations, patient history, and epidemiological information. The expected result is Negative.  Fact Sheet for Patients:  EntrepreneurPulse.com.au  Fact Sheet for Healthcare Providers:  IncredibleEmployment.be  This test is no t yet approved or cleared by the [place] FDA and  has been authorized for detection and/or diagnosis of SARS-CoV-2 by FDA under an Emergency Use Authorization (EUA). This EUA will remain  in effect (meaning this test can be used) for the duration of  the COVID-19 declaration under Section 564(b)(1) of the Act, 21 U.S.C.section 360bbb-3(b)(1), unless the authorization is terminated  or revoked sooner.       Influenza A by PCR POSITIVE (A) NEGATIVE Final   Influenza B by PCR NEGATIVE NEGATIVE Final    Comment: (NOTE) The Xpert Xpress SARS-CoV-2/FLU/RSV plus assay is intended as an aid in the diagnosis of influenza from Nasopharyngeal swab specimens and should not be used as a sole basis for treatment. Nasal washings and aspirates are unacceptable for Xpert Xpress SARS-CoV-2/FLU/RSV testing.  Fact Sheet for Patients: EntrepreneurPulse.com.au  Fact Sheet for Healthcare Providers: IncredibleEmployment.be  This test is not yet approved or cleared by the [place] FDA and has been authorized for detection and/or diagnosis of SARS-CoV-2 by FDA under an Emergency Use Authorization (EUA). This EUA will remain in effect (meaning this test can be used) for the duration of the COVID-19  declaration under Section 564(b)(1) of the Act, 21 U.S.C. section 360bbb-3(b)(1), unless the authorization is terminated or revoked.     Resp Syncytial Virus by PCR NEGATIVE NEGATIVE Final    Comment: (NOTE) Fact Sheet for Patients: EntrepreneurPulse.com.au  Fact Sheet for Healthcare Providers: IncredibleEmployment.be  This test is not yet approved or cleared by the [place] FDA and has been authorized for detection and/or diagnosis of SARS-CoV-2 by FDA under an Emergency Use Authorization (EUA). This EUA will remain in effect (meaning this test can be used) for the duration of the COVID-19 declaration under Section 564(b)(1) of the Act, 21 U.S.C. section 360bbb-3(b)(1), unless the authorization is terminated or revoked.  Performed at [facility], [address] [place], [place] [address]     Procedures and diagnostic studies:  No results found.            Francys Bolin  [employer], advertising on [email or web address]. If 7PM-7AM, please contact night-coverage at [email or web address]     05/31/2022, 4:30 PM

## 2022-05-31 NOTE — Progress Notes (Signed)
Physical Therapy Treatment Patient Details Name: Adam Merritt MRN: [id number] DOB: [birth year] Today's Date: 05/31/2022   History of Present Illness Pt is a 89yoM who comes to [facility] 12/18 after a fall. PMH includes: DM2, CKD 3a, CAD, HTN, lymphedema, diabetic neuropathy, GERD, venous insufficiency with BLE ulceratons. 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 He has more recent BUE tremor in past few months. MD assessment includes: acute UTI, syncope, generalized weakness, and hypokalemia.    PT Comments    Pt declined mobility this session secondary to fatigue and generally no feeling well due to recent diagnosis of flu.  Pt agreed to supine therex and put forth good effort during the session with frequent therapeutic rest breaks as needed.  Pt found on 1.5LO2/min with SpO2 in the mid 90s during the session.  Pt will benefit from PT services in a SNF setting upon discharge to safely address deficits listed in patient problem list for decreased caregiver assistance and eventual return to PLOF.    Recommendations for follow up therapy are one component of a multi-disciplinary discharge planning process, led by the attending physician.  Recommendations may be updated based on patient status, additional functional criteria and insurance authorization.  Follow Up Recommendations  Skilled nursing-short term rehab (<3 hours/day) Can patient physically be transported by private vehicle: No   Assistance Recommended at Discharge Frequent or constant Supervision/Assistance  Patient can return home with the following Two people to help with walking and/or transfers;Two people to help with bathing/dressing/bathroom;Assistance with cooking/housework;Assistance with feeding;Direct supervision/assist for medications management;Assist for transportation;Help with stairs or ramp for entrance   Equipment Recommendations  Other (comment) (TBD at next venue of care)    Recommendations for Other Services        Precautions / Restrictions Precautions Precautions: Fall Precaution Comments: Una boots bilaterally Restrictions Weight Bearing Restrictions: No     Mobility  Bed Mobility               General bed mobility comments: Pt declined all but below therex this session secondary to fatigue    Transfers                        Ambulation/Gait                   Stairs             Wheelchair Mobility    Modified Rankin (Stroke Patients Only)       Balance                                            Cognition Arousal/Alertness: Awake/alert Behavior During Therapy: WFL for tasks assessed/performed Overall Cognitive Status: Within Functional Limits for tasks assessed                                          Exercises Total Joint Exercises Ankle Circles/Pumps: Strengthening, Both, 10 reps, 15 reps (with manual resistance) Quad Sets: Strengthening, Both, 10 reps, 15 reps Gluteal Sets: Strengthening, Both, 10 reps, 15 reps Towel Squeeze: Strengthening, Both, 10 reps, 15 reps Short Arc Quad: Strengthening, Both, 10 reps, 15 reps, AAROM (AAROM on RLE) Heel Slides: AAROM, Strengthening, Both, 15 reps, 10 reps Hip ABduction/ADduction: AAROM, Strengthening, Both,  10 reps, 15 reps Straight Leg Raises: AAROM, Strengthening, Both, 10 reps, 15 reps Other Exercises Other Exercises: Supine leg press to both LE's with manual resistance 2 x 10    General Comments        Pertinent Vitals/Pain Pain Assessment Pain Assessment: No/denies pain    Home Living                          Prior Function            PT Goals (current goals can now be found in the care plan section) Progress towards PT goals: PT to reassess next treatment    Frequency    Min 2X/week      PT Plan Current plan remains appropriate    Co-evaluation              AM-PAC PT "6 Clicks" Mobility   Outcome Measure  Help  needed turning from your back to your side while in a flat bed without using bedrails?: Total Help needed moving from lying on your back to sitting on the side of a flat bed without using bedrails?: Total Help needed moving to and from a bed to a chair (including a wheelchair)?: Total Help needed standing up from a chair using your arms (e.g., wheelchair or bedside chair)?: Total Help needed to walk in hospital room?: Total Help needed climbing 3-5 steps with a railing? : Total 6 Click Score: 6    End of Session Equipment Utilized During Treatment: Gait belt Activity Tolerance: Patient tolerated treatment well Patient left: in bed;with call bell/phone within reach;with bed alarm set Nurse Communication: Mobility status PT Visit Diagnosis: Other abnormalities of gait and mobility (R26.89);Muscle weakness (generalized) (M62.81)     Time: 3790-2409 PT Time Calculation (min) (ACUTE ONLY): 23 min  Charges:  $Therapeutic Exercise: 23-37 mins                    D. Scott Court Gracia PT, DPT 05/31/22, 5:15 PM

## 2022-05-31 NOTE — Progress Notes (Signed)
Nutrition Follow-up  DOCUMENTATION CODES:   Morbid obesity, Non-severe (moderate) malnutrition in context of chronic illness  INTERVENTION:   -Continue Ensure Enlive po TID, each supplement provides 350 kcal and 20 grams of protein.  -Continue Magic cup TID with meals, each supplement provides 290 kcal and 9 grams of protein  -Continue MVI with minerals daily -Continue 500 mg vitamin C BID -Continue 220 mg zinc sulfate daily x 14 days -Continue feeding assistance with meals -Continue 2 gram sodium for wider variety of meal selections  NUTRITION DIAGNOSIS:   Moderate Malnutrition related to chronic illness (CAD, lymphadema) as evidenced by mild fat depletion, mild muscle depletion.  Ongoing  GOAL:   Patient will meet greater than or equal to 90% of their needs  Progressing  MONITOR:   Supplement acceptance, PO intake  REASON FOR ASSESSMENT:   Malnutrition Screening Tool    ASSESSMENT:   Pt with medical history significant for  diabetes mellitus with complications of stage IIIa chronic kidney disease, coronary artery disease, hypertension, chronic lymphedema, diabetic neuropathy, GERD, history of venous insufficiency with bilateral lower extremity ulcerations who presents for evaluation of generalized weakness with subsequent syncope and fall without head injury.  Reviewed I/O's: -1.1 L x 24 hours and -5.3 L since admission  UOP: 1.4 L x 24 hours   Attempted to speak with pt x 2, however, working with therapy or receiving nursing care.   Pt with variable intake. Noted meal completions 50%. Pt is compliant with drinking supplements.   Per TOC notes, plan to d/c to SNF once medically stable.   Medications reviewed and include vitamin D3, colace, and folic acid.   Labs reviewed: CBGS: 98-145 (inpatient orders for glycemic control are 0-9 units insulin aspart TID with meals and 30 units insulin glargine-yfgn daily).    Diet Order:   Diet Order             Diet 2  gram sodium Fluid consistency: Thin  Diet effective now                   EDUCATION NEEDS:   Education needs have been addressed  Skin:  Skin Assessment: Skin Integrity Issues: Skin Integrity Issues:: Stage II Stage II: buttocks  Last BM:  05/30/22 (type 6)  Height:   Ht Readings from Last 1 Encounters:  05/22/22 '5\' 5"'$  (1.651 m)    Weight:   Wt Readings from Last 1 Encounters:  05/22/22 131.1 kg    Ideal Body Weight:  61.8 kg  BMI:  Body mass index is 48.09 kg/m.  Estimated Nutritional Needs:   Kcal:  1950-2150  Protein:  105-120 grams  Fluid:  > 1.9 L    Loistine Chance, RD, LDN, [place] Registered Dietitian II Certified Diabetes Care and Education Specialist Please refer to [facility] for RD and/or RD on-call/weekend/after hours pager

## 2022-05-31 NOTE — Progress Notes (Addendum)
Progress Note    Adam Merritt  OMV:[id number] DOB: 09/14/1945  DOA: 05/22/2022 PCP: [name], MD      Brief Narrative:    Medical records reviewed and are as summarized below:  Adam Merritt is a 76 y.o. male  with medical history significant for Bell's palsy,  diabetes mellitus with complications of stage IIIa chronic kidney disease, coronary artery disease, chronic diastolic CHF, history of stroke, hypertension, chronic lymphedema, diabetic neuropathy, GERD, history of venous insufficiency with bilateral lower extremity ulcerations.  He presented to the hospital because of generalized weakness, syncope and fall without head injury.  He also complained of urinary frequency and dysuria.     Assessment/Plan:   Principal Problem:   Syncope Active Problems:   Acute lower UTI   Dyslipidemia   Type 2 diabetes mellitus with peripheral neuropathy (HCC)   Fall   Weakness   Malnutrition of moderate degree   Influenza A   Pressure injury of skin    Body mass index is 48.09 kg/m.  (Morbid obesity)  Acute UTI: Urine culture showed Enterococcus faecalis and E. coli.  He has only 1 dose of nitrofurantoin left to complete 7-day treatment.  Renal ultrasound did not show any hydronephrosis, nephrolithiasis.  Post void residual urine on 05/24/2022  was 136 mls.  He is voiding spontaneously   S/p syncope: Probably from general weakness   Generalized weakness: Continue PT and OT.  Awaiting placement to SNF.  Follow-up with social worker to assist with disposition   Hypokalemia: Improved.   Cough, influenza A infection: He was started on Tamiflu on 05/30/2022 for positive influenza A test.  He has been placed on droplet precautions   Tremors of bilateral upper extremities: his wife said he was supposed to see a neurologist at [facility] in November, 2023. Follow up with neurologist as an outpatient  Other comorbidities include CAD, type II DM, CKD stage IIIa,  peripheral neuropathy, hyperlipidemia, chronic lymphedema lower extremities, history of stroke, chronic diastolic CHF (2D echo in August 2021 showed normal EF)    Diet Order             Diet 2 gram sodium Fluid consistency: Thin  Diet effective now                            Consultants: None  Procedures: None    Medications:    ascorbic acid  500 mg Oral BID   aspirin EC  325 mg Oral Daily   atorvastatin  40 mg Oral QHS   carvedilol  25 mg Oral BID WC   vitamin D3  1,000 Units Oral Daily   cyanocobalamin  1,000 mcg Oral Daily   docusate  50 mg Oral BID   enoxaparin (LOVENOX) injection  0.5 mg/kg Subcutaneous Q24H   feeding supplement  237 mL Oral TID BM   folic acid  1 mg Oral Daily   gabapentin  300 mg Oral BID   insulin aspart  0-9 Units Subcutaneous TID WC   insulin glargine-yfgn  30 Units Subcutaneous Daily   iron polysaccharides  150 mg Oral Daily   multivitamin with minerals  1 tablet Oral Daily   nitrofurantoin (macrocrystal-monohydrate)  100 mg Oral Q12H   oseltamivir  75 mg Oral BID   pantoprazole  40 mg Oral Daily   pentoxifylline  400 mg Oral TID WC   prazosin  1 mg Oral QHS  sertraline  100 mg Oral Daily   traZODone  75 mg Oral QHS   zinc sulfate  220 mg Oral Daily   Continuous Infusions:     Anti-infectives (From admission, onward)    Start     Dose/Rate Route Frequency Ordered Stop   05/30/22 2200  oseltamivir (TAMIFLU) capsule 75 mg        75 mg Oral 2 times daily 05/30/22 1835 06/04/22 2159   05/25/22 1500  nitrofurantoin (macrocrystal-monohydrate) (MACROBID) capsule 100 mg        100 mg Oral Every 12 hours 05/25/22 1412 06/01/22 0959   05/24/22 0200  cefTRIAXone (ROCEPHIN) 2 g in sodium chloride 0.9 % 100 mL IVPB  Status:  Discontinued        2 g 200 mL/hr over 30 Minutes Intravenous Every 24 hours 05/23/22 0217 05/25/22 1412   05/23/22 0230  cefTRIAXone (ROCEPHIN) 1 g in sodium chloride 0.9 % 100 mL IVPB        1 g 200  mL/hr over 30 Minutes Intravenous  Once 05/23/22 0229 05/23/22 0448   05/23/22 0130  cefTRIAXone (ROCEPHIN) 1 g in sodium chloride 0.9 % 100 mL IVPB        1 g 200 mL/hr over 30 Minutes Intravenous  Once 05/23/22 0122 05/23/22 0214              Family Communication/Anticipated D/C date and plan/Code Status   DVT prophylaxis:      Code Status: Full Code  Family Communication: Plan discussed with his wife at the bedside Disposition Plan: SNF at discharge   Status is: Inpatient Remains inpatient appropriate because: Needs SNF at discharge          Subjective:   C/o cough.  No other complaints.  No shortness of breath or chest pain.  Objective:     Vitals:   05/31/22 0650 05/31/22 0744 05/31/22 0931 05/31/22 1612  BP: 130/62 116/75  (!) 146/63  Pulse: (!) 55 (!) 52  (!) 56  Resp:  16  16  Temp: 98.7 F (37.1 C) 98.2 F (36.8 C)  98.3 F (36.8 C)  TempSrc: Oral     SpO2: 99% 97% 96% 100%  Weight:      Height:         No data found.    Intake/Output Summary (Last 24 hours) at 05/31/2022 1627 Last data filed at 05/30/2022 2245 Gross per 24 hour  Intake 300 ml  Output 800 ml  Net -500 ml    Filed Weights   05/22/22 1913  Weight: 131.1 kg    Exam:   GEN: NAD SKIN: Warm and dry EYES: No pallor or icterus ENT: MMM CV: RRR PULM: CTA B ABD: soft, obese, NT, +BS CNS: AAO x 3, non focal EXT: Lymphedema of bilateral legs, no tenderness    Pressure Injury 05/23/22 Buttocks Mid Stage 2 -  Partial thickness loss of dermis presenting as a shallow open injury with a red, pink wound bed without slough. (Active)  05/23/22 2000  Location: Buttocks  Location Orientation: Mid  Staging: Stage 2 -  Partial thickness loss of dermis presenting as a shallow open injury with a red, pink wound bed without slough.  Wound Description (Comments):   Present on Admission: Yes  Dressing Type Foam - Lift dressing to assess site every shift 05/31/22 0023      Data Reviewed:   I have personally reviewed following labs and imaging studies:  Labs: Labs show the following:  Basic Metabolic Panel: Recent Labs  Lab 05/25/22 0402 05/30/22 0747  NA 139 140  K 3.7 3.8  CL 105 105  CO2 26 27  GLUCOSE 81 71  BUN 21 22  CREATININE 1.36* 1.18  CALCIUM 8.6* 8.9  MG 2.2 2.3  PHOS  --  3.5   GFR Estimated Creatinine Clearance: 66.2 mL/min (by C-G formula based on SCr of 1.18 mg/dL). Liver Function Tests: No results for input(s): "AST", "ALT", "ALKPHOS", "BILITOT", "PROT", "ALBUMIN" in the last 168 hours.  No results for input(s): "LIPASE", "AMYLASE" in the last 168 hours. No results for input(s): "AMMONIA" in the last 168 hours.  Coagulation profile No results for input(s): "INR", "PROTIME" in the last 168 hours.  CBC: Recent Labs  Lab 05/30/22 0747  WBC 5.2  NEUTROABS 2.6  HGB 11.7*  HCT 36.6*  MCV 91.5  PLT 122*   Cardiac Enzymes: No results for input(s): "CKTOTAL", "CKMB", "CKMBINDEX", "TROPONINI" in the last 168 hours. BNP (last 3 results) No results for input(s): "PROBNP" in the last 8760 hours. CBG: Recent Labs  Lab 05/30/22 1617 05/30/22 2215 05/31/22 0745 05/31/22 1139 05/31/22 1609  GLUCAP 109* 98 102* 145* 107*   D-Dimer: No results for input(s): "DDIMER" in the last 72 hours. Hgb A1c: No results for input(s): "HGBA1C" in the last 72 hours. Lipid Profile: No results for input(s): "CHOL", "HDL", "LDLCALC", "TRIG", "CHOLHDL", "LDLDIRECT" in the last 72 hours. Thyroid function studies: No results for input(s): "TSH", "T4TOTAL", "T3FREE", "THYROIDAB" in the last 72 hours.  Invalid input(s): "FREET3" Anemia work up: No results for input(s): "VITAMINB12", "FOLATE", "FERRITIN", "TIBC", "IRON", "RETICCTPCT" in the last 72 hours. Sepsis Labs: Recent Labs  Lab 05/30/22 0747  WBC 5.2    Microbiology Recent Results (from the past 240 hour(s))  Resp panel by RT-PCR (RSV, Flu A&B, Covid) Anterior Nasal  Swab     Status: None   Collection Time: 05/22/22  7:17 PM   Specimen: Anterior Nasal Swab  Result Value Ref Range Status   SARS Coronavirus 2 by RT PCR NEGATIVE NEGATIVE Final    Comment: (NOTE) SARS-CoV-2 target nucleic acids are NOT DETECTED.  The SARS-CoV-2 RNA is generally detectable in upper respiratory specimens during the acute phase of infection. The lowest concentration of SARS-CoV-2 viral copies this assay can detect is 138 copies/mL. A negative result does not preclude SARS-Cov-2 infection and should not be used as the sole basis for treatment or other patient management decisions. A negative result may occur with  improper specimen collection/handling, submission of specimen other than nasopharyngeal swab, presence of viral mutation(s) within the areas targeted by this assay, and inadequate number of viral copies(<138 copies/mL). A negative result must be combined with clinical observations, patient history, and epidemiological information. The expected result is Negative.  Fact Sheet for Patients:  EntrepreneurPulse.com.au  Fact Sheet for Healthcare Providers:  IncredibleEmployment.be  This test is no t yet approved or cleared by the [place] FDA and  has been authorized for detection and/or diagnosis of SARS-CoV-2 by FDA under an Emergency Use Authorization (EUA). This EUA will remain  in effect (meaning this test can be used) for the duration of the COVID-19 declaration under Section 564(b)(1) of the Act, 21 U.S.C.section 360bbb-3(b)(1), unless the authorization is terminated  or revoked sooner.       Influenza A by PCR NEGATIVE NEGATIVE Final   Influenza B by PCR NEGATIVE NEGATIVE Final    Comment: (NOTE) The Xpert Xpress SARS-CoV-2/FLU/RSV plus assay is intended as an  aid in the diagnosis of influenza from Nasopharyngeal swab specimens and should not be used as a sole basis for treatment. Nasal washings and aspirates  are unacceptable for Xpert Xpress SARS-CoV-2/FLU/RSV testing.  Fact Sheet for Patients: EntrepreneurPulse.com.au  Fact Sheet for Healthcare Providers: IncredibleEmployment.be  This test is not yet approved or cleared by the [place] FDA and has been authorized for detection and/or diagnosis of SARS-CoV-2 by FDA under an Emergency Use Authorization (EUA). This EUA will remain in effect (meaning this test can be used) for the duration of the COVID-19 declaration under Section 564(b)(1) of the Act, 21 U.S.C. section 360bbb-3(b)(1), unless the authorization is terminated or revoked.     Resp Syncytial Virus by PCR NEGATIVE NEGATIVE Final    Comment: (NOTE) Fact Sheet for Patients: EntrepreneurPulse.com.au  Fact Sheet for Healthcare Providers: IncredibleEmployment.be  This test is not yet approved or cleared by the [place] FDA and has been authorized for detection and/or diagnosis of SARS-CoV-2 by FDA under an Emergency Use Authorization (EUA). This EUA will remain in effect (meaning this test can be used) for the duration of the COVID-19 declaration under Section 564(b)(1) of the Act, 21 U.S.C. section 360bbb-3(b)(1), unless the authorization is terminated or revoked.  Performed at [facility], [address] [place], [place] [address]   Urine Culture     Status: Abnormal   Collection Time: 05/22/22  7:17 PM   Specimen: Urine, Random  Result Value Ref Range Status   Specimen Description   Final    URINE, RANDOM Performed at [facility], [address] [place], [place] [address]    Special Requests   Final    NONE Performed at [facility], [address] [place], [place] [address]    Culture (A)  Final    >=100,000 COLONIES/mL ENTEROCOCCUS FAECALIS 10,000 COLONIES/mL ESCHERICHIA COLI    Report Status 05/25/2022 FINAL  Final   Organism ID, Bacteria  ENTEROCOCCUS FAECALIS (A)  Final   Organism ID, Bacteria ESCHERICHIA COLI (A)  Final      Susceptibility   Escherichia coli - MIC*    AMPICILLIN >=32 RESISTANT Resistant     CEFAZOLIN <=4 SENSITIVE Sensitive     CEFEPIME <=0.12 SENSITIVE Sensitive     CEFTRIAXONE <=0.25 SENSITIVE Sensitive     CIPROFLOXACIN >=4 RESISTANT Resistant     GENTAMICIN <=1 SENSITIVE Sensitive     IMIPENEM <=0.25 SENSITIVE Sensitive     NITROFURANTOIN <=16 SENSITIVE Sensitive     TRIMETH/SULFA >=320 RESISTANT Resistant     AMPICILLIN/SULBACTAM 16 INTERMEDIATE Intermediate     PIP/TAZO <=4 SENSITIVE Sensitive     * 10,000 COLONIES/mL ESCHERICHIA COLI   Enterococcus faecalis - MIC*    AMPICILLIN <=2 SENSITIVE Sensitive     NITROFURANTOIN <=16 SENSITIVE Sensitive     VANCOMYCIN 1 SENSITIVE Sensitive     * >=100,000 COLONIES/mL ENTEROCOCCUS FAECALIS  Resp panel by RT-PCR (RSV, Flu A&B, Covid) Anterior Nasal Swab     Status: Abnormal   Collection Time: 05/30/22  4:02 PM   Specimen: Anterior Nasal Swab  Result Value Ref Range Status   SARS Coronavirus 2 by RT PCR NEGATIVE NEGATIVE Final    Comment: (NOTE) SARS-CoV-2 target nucleic acids are NOT DETECTED.  The SARS-CoV-2 RNA is generally detectable in upper respiratory specimens during the acute phase of infection. The lowest concentration of SARS-CoV-2 viral copies this assay can detect is 138 copies/mL. A negative result does not preclude SARS-Cov-2 infection and should not be used  as the sole basis for treatment or other patient management decisions. A negative result may occur with  improper specimen collection/handling, submission of specimen other than nasopharyngeal swab, presence of viral mutation(s) within the areas targeted by this assay, and inadequate number of viral copies(<138 copies/mL). A negative result must be combined with clinical observations, patient history, and epidemiological information. The expected result is Negative.  Fact  Sheet for Patients:  EntrepreneurPulse.com.au  Fact Sheet for Healthcare Providers:  IncredibleEmployment.be  This test is no t yet approved or cleared by the [place] FDA and  has been authorized for detection and/or diagnosis of SARS-CoV-2 by FDA under an Emergency Use Authorization (EUA). This EUA will remain  in effect (meaning this test can be used) for the duration of the COVID-19 declaration under Section 564(b)(1) of the Act, 21 U.S.C.section 360bbb-3(b)(1), unless the authorization is terminated  or revoked sooner.       Influenza A by PCR POSITIVE (A) NEGATIVE Final   Influenza B by PCR NEGATIVE NEGATIVE Final    Comment: (NOTE) The Xpert Xpress SARS-CoV-2/FLU/RSV plus assay is intended as an aid in the diagnosis of influenza from Nasopharyngeal swab specimens and should not be used as a sole basis for treatment. Nasal washings and aspirates are unacceptable for Xpert Xpress SARS-CoV-2/FLU/RSV testing.  Fact Sheet for Patients: EntrepreneurPulse.com.au  Fact Sheet for Healthcare Providers: IncredibleEmployment.be  This test is not yet approved or cleared by the [place] FDA and has been authorized for detection and/or diagnosis of SARS-CoV-2 by FDA under an Emergency Use Authorization (EUA). This EUA will remain in effect (meaning this test can be used) for the duration of the COVID-19 declaration under Section 564(b)(1) of the Act, 21 U.S.C. section 360bbb-3(b)(1), unless the authorization is terminated or revoked.     Resp Syncytial Virus by PCR NEGATIVE NEGATIVE Final    Comment: (NOTE) Fact Sheet for Patients: EntrepreneurPulse.com.au  Fact Sheet for Healthcare Providers: IncredibleEmployment.be  This test is not yet approved or cleared by the [place] FDA and has been authorized for detection and/or diagnosis of SARS-CoV-2 by FDA  under an Emergency Use Authorization (EUA). This EUA will remain in effect (meaning this test can be used) for the duration of the COVID-19 declaration under Section 564(b)(1) of the Act, 21 U.S.C. section 360bbb-3(b)(1), unless the authorization is terminated or revoked.  Performed at [facility], [address] [place], [place] [address]     Procedures and diagnostic studies:  No results found.             LOS: 7 days   Amadeus Oyama  [facility]   Pager on [email or web address]. If 7PM-7AM, please contact night-coverage at [email or web address]     05/31/2022, 4:27 PM

## 2022-06-01 DIAGNOSIS — N390 Urinary tract infection, site not specified: Secondary | ICD-10-CM | POA: Diagnosis not present

## 2022-06-01 DIAGNOSIS — R531 Weakness: Secondary | ICD-10-CM | POA: Diagnosis not present

## 2022-06-01 DIAGNOSIS — J101 Influenza due to other identified influenza virus with other respiratory manifestations: Secondary | ICD-10-CM | POA: Diagnosis not present

## 2022-06-01 LAB — GLUCOSE, CAPILLARY
Glucose-Capillary: 121 mg/dL — ABNORMAL HIGH (ref 70–99)
Glucose-Capillary: 104 mg/dL — ABNORMAL HIGH (ref 70–99)
Glucose-Capillary: 134 mg/dL — ABNORMAL HIGH (ref 70–99)

## 2022-06-01 MED ORDER — ORAL CARE MOUTH RINSE: 15.0000 mL | OROMUCOSAL | Status: DC | PRN

## 2022-06-01 NOTE — Progress Notes (Addendum)
Progress Note    Adam Merritt  [name]:[id number] DOB: Apr 16, 1946  DOA: 05/22/2022 PCP: [name], MD      Brief Narrative:    Medical records reviewed and are as summarized below:  Adam Merritt is a 76 y.o. male  with medical history significant for Bell's palsy,  diabetes mellitus with complications of stage IIIa chronic kidney disease, coronary artery disease, chronic diastolic CHF, history of stroke, hypertension, chronic lymphedema, diabetic neuropathy, GERD, history of venous insufficiency with bilateral lower extremity ulcerations.  He presented to the hospital because of generalized weakness, syncope and fall without head injury.  He also complained of urinary frequency and dysuria.     Assessment/Plan:   Principal Problem:   Syncope Active Problems:   Acute lower UTI   Dyslipidemia   Type 2 diabetes mellitus with peripheral neuropathy (HCC)   Fall   Weakness   Malnutrition of moderate degree   Influenza A   Pressure injury of skin    Body mass index is 48.09 kg/m.  (Morbid obesity)  Acute UTI: Urine culture showed Enterococcus faecalis and E. coli.  Completed 7 days of nitrofurantoin on 05/31/2022.  Initially received 3 days of IV Rocephin.  Renal ultrasound did not show any hydronephrosis, nephrolithiasis.  Post void residual urine on 05/24/2022  was 136 mls.  He is voiding spontaneously   S/p syncope: Probably from general weakness   Generalized weakness: Continue PT and OT while inpatient.  Awaiting placement to SNF.  Follow-up with social worker to assist with disposition   Hypokalemia: Improved.   Cough, influenza A infection: Continue Tamiflu and droplet precautions.    Tremors of bilateral upper extremities: his wife said he was supposed to see a neurologist at [facility] in November, 2023. Follow up with neurologist as an outpatient  Other comorbidities include chronic hypoxic respiratory failure on 2 L/min oxygen at night, CAD,  type II DM, CKD stage IIIa, peripheral neuropathy, hyperlipidemia, chronic lymphedema lower extremities, history of stroke, chronic diastolic CHF (2D echo in August 2021 showed normal EF)    Diet Order             Diet 2 gram sodium Fluid consistency: Thin  Diet effective now                            Consultants: None  Procedures: None    Medications:    ascorbic acid  500 mg Oral BID   aspirin EC  325 mg Oral Daily   atorvastatin  40 mg Oral QHS   carvedilol  25 mg Oral BID WC   vitamin D3  1,000 Units Oral Daily   cyanocobalamin  1,000 mcg Oral Daily   docusate  50 mg Oral BID   enoxaparin (LOVENOX) injection  0.5 mg/kg Subcutaneous Q24H   feeding supplement  237 mL Oral TID BM   folic acid  1 mg Oral Daily   gabapentin  300 mg Oral BID   insulin aspart  0-9 Units Subcutaneous TID WC   insulin glargine-yfgn  30 Units Subcutaneous Daily   iron polysaccharides  150 mg Oral Daily   multivitamin with minerals  1 tablet Oral Daily   oseltamivir  75 mg Oral BID   pantoprazole  40 mg Oral Daily   pentoxifylline  400 mg Oral TID WC   prazosin  1 mg Oral QHS   sertraline  100 mg Oral Daily  traZODone  75 mg Oral QHS   zinc sulfate  220 mg Oral Daily   Continuous Infusions:     Anti-infectives (From admission, onward)    Start     Dose/Rate Route Frequency Ordered Stop   05/30/22 2200  oseltamivir (TAMIFLU) capsule 75 mg        75 mg Oral 2 times daily 05/30/22 1835 06/04/22 2159   05/25/22 1500  nitrofurantoin (macrocrystal-monohydrate) (MACROBID) capsule 100 mg        100 mg Oral Every 12 hours 05/25/22 1412 05/31/22 2238   05/24/22 0200  cefTRIAXone (ROCEPHIN) 2 g in sodium chloride 0.9 % 100 mL IVPB  Status:  Discontinued        2 g 200 mL/hr over 30 Minutes Intravenous Every 24 hours 05/23/22 0217 05/25/22 1412   05/23/22 0230  cefTRIAXone (ROCEPHIN) 1 g in sodium chloride 0.9 % 100 mL IVPB        1 g 200 mL/hr over 30 Minutes Intravenous   Once 05/23/22 0229 05/23/22 0448   05/23/22 0130  cefTRIAXone (ROCEPHIN) 1 g in sodium chloride 0.9 % 100 mL IVPB        1 g 200 mL/hr over 30 Minutes Intravenous  Once 05/23/22 0122 05/23/22 0214              Family Communication/Anticipated D/C date and plan/Code Status   DVT prophylaxis:      Code Status: Full Code  Family Communication: Plan discussed with his wife at the bedside Disposition Plan: SNF at discharge   Status is: Inpatient Remains inpatient appropriate because: Needs SNF at discharge          Subjective:   He complains of cough and generalized weakness.  No shortness of breath or chest pain.  Objective:     Vitals:   05/31/22 1612 05/31/22 2030 06/01/22 0500 06/01/22 0755  BP: (!) 146/63 (!) 129/59 118/62 (!) 137/58  Pulse: (!) 56 (!) 53 (!) 58 (!) 55  Resp: '16 18 17 20  '$ Temp: 98.3 F (36.8 C) (!) 97.4 F (36.3 C) 98 F (36.7 C) (!) 97.5 F (36.4 C)  TempSrc:   Oral Oral  SpO2: 100% 98% 97% 90%  Weight:      Height:         No data found.    Intake/Output Summary (Last 24 hours) at 06/01/2022 1515 Last data filed at 05/31/2022 2300 Gross per 24 hour  Intake --  Output 400 ml  Net -400 ml    Filed Weights   05/22/22 1913  Weight: 131.1 kg    Exam:   GEN: NAD SKIN: Warm and dry EYES: Anicteric ENT: MMM CV: RRR PULM: CTA B ABD: soft, obese, NT, +BS CNS: AAO x 3, non focal EXT: Chronic lymphedema of bilateral legs, no tenderness    Pressure Injury 05/23/22 Buttocks Mid Stage 2 -  Partial thickness loss of dermis presenting as a shallow open injury with a red, pink wound bed without slough. (Active)  05/23/22 2000  Location: Buttocks  Location Orientation: Mid  Staging: Stage 2 -  Partial thickness loss of dermis presenting as a shallow open injury with a red, pink wound bed without slough.  Wound Description (Comments):   Present on Admission: Yes  Dressing Type Foam - Lift dressing to assess site every  shift 05/31/22 2000     Data Reviewed:   I have personally reviewed following labs and imaging studies:  Labs: Labs show the following:   Basic  Metabolic Panel: Recent Labs  Lab 05/30/22 0747  NA 140  K 3.8  CL 105  CO2 27  GLUCOSE 71  BUN 22  CREATININE 1.18  CALCIUM 8.9  MG 2.3  PHOS 3.5   GFR Estimated Creatinine Clearance: 66.2 mL/min (by C-G formula based on SCr of 1.18 mg/dL). Liver Function Tests: No results for input(s): "AST", "ALT", "ALKPHOS", "BILITOT", "PROT", "ALBUMIN" in the last 168 hours.  No results for input(s): "LIPASE", "AMYLASE" in the last 168 hours. No results for input(s): "AMMONIA" in the last 168 hours.  Coagulation profile No results for input(s): "INR", "PROTIME" in the last 168 hours.  CBC: Recent Labs  Lab 05/30/22 0747  WBC 5.2  NEUTROABS 2.6  HGB 11.7*  HCT 36.6*  MCV 91.5  PLT 122*   Cardiac Enzymes: No results for input(s): "CKTOTAL", "CKMB", "CKMBINDEX", "TROPONINI" in the last 168 hours. BNP (last 3 results) No results for input(s): "PROBNP" in the last 8760 hours. CBG: Recent Labs  Lab 05/31/22 1139 05/31/22 1609 05/31/22 2106 06/01/22 0755 06/01/22 1247  GLUCAP 145* 107* 78 121* 104*   D-Dimer: No results for input(s): "DDIMER" in the last 72 hours. Hgb A1c: No results for input(s): "HGBA1C" in the last 72 hours. Lipid Profile: No results for input(s): "CHOL", "HDL", "LDLCALC", "TRIG", "CHOLHDL", "LDLDIRECT" in the last 72 hours. Thyroid function studies: No results for input(s): "TSH", "T4TOTAL", "T3FREE", "THYROIDAB" in the last 72 hours.  Invalid input(s): "FREET3" Anemia work up: No results for input(s): "VITAMINB12", "FOLATE", "FERRITIN", "TIBC", "IRON", "RETICCTPCT" in the last 72 hours. Sepsis Labs: Recent Labs  Lab 05/30/22 0747  WBC 5.2    Microbiology Recent Results (from the past 240 hour(s))  Resp panel by RT-PCR (RSV, Flu A&B, Covid) Anterior Nasal Swab     Status: None   Collection  Time: 05/22/22  7:17 PM   Specimen: Anterior Nasal Swab  Result Value Ref Range Status   SARS Coronavirus 2 by RT PCR NEGATIVE NEGATIVE Final    Comment: (NOTE) SARS-CoV-2 target nucleic acids are NOT DETECTED.  The SARS-CoV-2 RNA is generally detectable in upper respiratory specimens during the acute phase of infection. The lowest concentration of SARS-CoV-2 viral copies this assay can detect is 138 copies/mL. A negative result does not preclude SARS-Cov-2 infection and should not be used as the sole basis for treatment or other patient management decisions. A negative result may occur with  improper specimen collection/handling, submission of specimen other than nasopharyngeal swab, presence of viral mutation(s) within the areas targeted by this assay, and inadequate number of viral copies(<138 copies/mL). A negative result must be combined with clinical observations, patient history, and epidemiological information. The expected result is Negative.  Fact Sheet for Patients:  EntrepreneurPulse.com.au  Fact Sheet for Healthcare Providers:  IncredibleEmployment.be  This test is no t yet approved or cleared by the [place] FDA and  has been authorized for detection and/or diagnosis of SARS-CoV-2 by FDA under an Emergency Use Authorization (EUA). This EUA will remain  in effect (meaning this test can be used) for the duration of the COVID-19 declaration under Section 564(b)(1) of the Act, 21 U.S.C.section 360bbb-3(b)(1), unless the authorization is terminated  or revoked sooner.       Influenza A by PCR NEGATIVE NEGATIVE Final   Influenza B by PCR NEGATIVE NEGATIVE Final    Comment: (NOTE) The Xpert Xpress SARS-CoV-2/FLU/RSV plus assay is intended as an aid in the diagnosis of influenza from Nasopharyngeal swab specimens and should not be used  as a sole basis for treatment. Nasal washings and aspirates are unacceptable for Xpert Xpress  SARS-CoV-2/FLU/RSV testing.  Fact Sheet for Patients: EntrepreneurPulse.com.au  Fact Sheet for Healthcare Providers: IncredibleEmployment.be  This test is not yet approved or cleared by the [place] FDA and has been authorized for detection and/or diagnosis of SARS-CoV-2 by FDA under an Emergency Use Authorization (EUA). This EUA will remain in effect (meaning this test can be used) for the duration of the COVID-19 declaration under Section 564(b)(1) of the Act, 21 U.S.C. section 360bbb-3(b)(1), unless the authorization is terminated or revoked.     Resp Syncytial Virus by PCR NEGATIVE NEGATIVE Final    Comment: (NOTE) Fact Sheet for Patients: EntrepreneurPulse.com.au  Fact Sheet for Healthcare Providers: IncredibleEmployment.be  This test is not yet approved or cleared by the [place] FDA and has been authorized for detection and/or diagnosis of SARS-CoV-2 by FDA under an Emergency Use Authorization (EUA). This EUA will remain in effect (meaning this test can be used) for the duration of the COVID-19 declaration under Section 564(b)(1) of the Act, 21 U.S.C. section 360bbb-3(b)(1), unless the authorization is terminated or revoked.  Performed at [facility], [address] [place], [place] [address]   Urine Culture     Status: Abnormal   Collection Time: 05/22/22  7:17 PM   Specimen: Urine, Random  Result Value Ref Range Status   Specimen Description   Final    URINE, RANDOM Performed at [facility], [address] [place], [place] [address]    Special Requests   Final    NONE Performed at [facility], [address] [place], [place] [address]    Culture (A)  Final    >=100,000 COLONIES/mL ENTEROCOCCUS FAECALIS 10,000 COLONIES/mL ESCHERICHIA COLI    Report Status 05/25/2022 FINAL  Final   Organism ID, Bacteria ENTEROCOCCUS FAECALIS (A)  Final    Organism ID, Bacteria ESCHERICHIA COLI (A)  Final      Susceptibility   Escherichia coli - MIC*    AMPICILLIN >=32 RESISTANT Resistant     CEFAZOLIN <=4 SENSITIVE Sensitive     CEFEPIME <=0.12 SENSITIVE Sensitive     CEFTRIAXONE <=0.25 SENSITIVE Sensitive     CIPROFLOXACIN >=4 RESISTANT Resistant     GENTAMICIN <=1 SENSITIVE Sensitive     IMIPENEM <=0.25 SENSITIVE Sensitive     NITROFURANTOIN <=16 SENSITIVE Sensitive     TRIMETH/SULFA >=320 RESISTANT Resistant     AMPICILLIN/SULBACTAM 16 INTERMEDIATE Intermediate     PIP/TAZO <=4 SENSITIVE Sensitive     * 10,000 COLONIES/mL ESCHERICHIA COLI   Enterococcus faecalis - MIC*    AMPICILLIN <=2 SENSITIVE Sensitive     NITROFURANTOIN <=16 SENSITIVE Sensitive     VANCOMYCIN 1 SENSITIVE Sensitive     * >=100,000 COLONIES/mL ENTEROCOCCUS FAECALIS  Resp panel by RT-PCR (RSV, Flu A&B, Covid) Anterior Nasal Swab     Status: Abnormal   Collection Time: 05/30/22  4:02 PM   Specimen: Anterior Nasal Swab  Result Value Ref Range Status   SARS Coronavirus 2 by RT PCR NEGATIVE NEGATIVE Final    Comment: (NOTE) SARS-CoV-2 target nucleic acids are NOT DETECTED.  The SARS-CoV-2 RNA is generally detectable in upper respiratory specimens during the acute phase of infection. The lowest concentration of SARS-CoV-2 viral copies this assay can detect is 138 copies/mL. A negative result does not preclude SARS-Cov-2 infection and should not be used as the sole basis for treatment or other patient management decisions. A negative result may  occur with  improper specimen collection/handling, submission of specimen other than nasopharyngeal swab, presence of viral mutation(s) within the areas targeted by this assay, and inadequate number of viral copies(<138 copies/mL). A negative result must be combined with clinical observations, patient history, and epidemiological information. The expected result is Negative.  Fact Sheet for Patients:   EntrepreneurPulse.com.au  Fact Sheet for Healthcare Providers:  IncredibleEmployment.be  This test is no t yet approved or cleared by the [place] FDA and  has been authorized for detection and/or diagnosis of SARS-CoV-2 by FDA under an Emergency Use Authorization (EUA). This EUA will remain  in effect (meaning this test can be used) for the duration of the COVID-19 declaration under Section 564(b)(1) of the Act, 21 U.S.C.section 360bbb-3(b)(1), unless the authorization is terminated  or revoked sooner.       Influenza A by PCR POSITIVE (A) NEGATIVE Final   Influenza B by PCR NEGATIVE NEGATIVE Final    Comment: (NOTE) The Xpert Xpress SARS-CoV-2/FLU/RSV plus assay is intended as an aid in the diagnosis of influenza from Nasopharyngeal swab specimens and should not be used as a sole basis for treatment. Nasal washings and aspirates are unacceptable for Xpert Xpress SARS-CoV-2/FLU/RSV testing.  Fact Sheet for Patients: EntrepreneurPulse.com.au  Fact Sheet for Healthcare Providers: IncredibleEmployment.be  This test is not yet approved or cleared by the [place] FDA and has been authorized for detection and/or diagnosis of SARS-CoV-2 by FDA under an Emergency Use Authorization (EUA). This EUA will remain in effect (meaning this test can be used) for the duration of the COVID-19 declaration under Section 564(b)(1) of the Act, 21 U.S.C. section 360bbb-3(b)(1), unless the authorization is terminated or revoked.     Resp Syncytial Virus by PCR NEGATIVE NEGATIVE Final    Comment: (NOTE) Fact Sheet for Patients: EntrepreneurPulse.com.au  Fact Sheet for Healthcare Providers: IncredibleEmployment.be  This test is not yet approved or cleared by the [place] FDA and has been authorized for detection and/or diagnosis of SARS-CoV-2 by FDA under an Emergency Use  Authorization (EUA). This EUA will remain in effect (meaning this test can be used) for the duration of the COVID-19 declaration under Section 564(b)(1) of the Act, 21 U.S.C. section 360bbb-3(b)(1), unless the authorization is terminated or revoked.  Performed at [facility], [address] [place], [place] [address]     Procedures and diagnostic studies:  No results found.             LOS: 8 days   Chrystian Cupples  [facility]   Pager on [email or web address]. If 7PM-7AM, please contact night-coverage at [email or web address]     06/01/2022, 3:15 PM

## 2022-06-01 NOTE — Consult Note (Signed)
[place] Nurse wound follow up Wound type: Unna boot application for lymphedema management.  No open wounds, lichenification present to lower legs and feet.  Measurement: intact skin, cracked and dry Wound bed:NA Drainage (amount, consistency, odor) None noted Periwound:NA Dressing procedure/placement/frequency:Wraps removed.  Patient requested to leave open to air for tonight. He states they have not been open to air for months. I  informed him that last week we left them open for the night and we needed to re-wrap today to manage edema.  He agreed. Legs cleansed with soap and water.  LEgs moisturized lightly and wrapped with zinc layer secured with self adherent coban.  Will follow.  Estrellita Ludwig MSN, RN, FNP-BC CWON Wound, Ostomy, Continence Nurse [facility] [phone] Pager [phone]

## 2022-06-02 DIAGNOSIS — N390 Urinary tract infection, site not specified: Secondary | ICD-10-CM | POA: Diagnosis not present

## 2022-06-02 DIAGNOSIS — R531 Weakness: Secondary | ICD-10-CM | POA: Diagnosis not present

## 2022-06-02 DIAGNOSIS — J101 Influenza due to other identified influenza virus with other respiratory manifestations: Secondary | ICD-10-CM | POA: Diagnosis not present

## 2022-06-02 LAB — GLUCOSE, CAPILLARY
Glucose-Capillary: 82 mg/dL (ref 70–99)
Glucose-Capillary: 101 mg/dL — ABNORMAL HIGH (ref 70–99)
Glucose-Capillary: 91 mg/dL (ref 70–99)
Glucose-Capillary: 83 mg/dL (ref 70–99)

## 2022-06-02 NOTE — Progress Notes (Deleted)
Pt arrived onto unit. Resting comfortable. Painad of . Unersponsive. Morphine transfusion@ 31m/hr. Report provided to me by LAlma Friendly ICU RN

## 2022-06-02 NOTE — Care Management Important Message (Signed)
Important Message  Patient Details  Name: Adam Merritt MRN: [id number] Date of Birth: 1945-11-09   Medicare Important Message Given:  Yes     Juliann Pulse A Ilija Maxim 06/02/2022, 1:20 PM

## 2022-06-02 NOTE — Progress Notes (Signed)
Occupational Therapy Treatment Patient Details Name: Adam Merritt MRN: [id number] DOB: 1945-12-04 Today's Date: 06/02/2022   History of present illness Pt is a 40yoM who [facility] after a fall. PMH includes: DM2, CKD 3a, CAD, HTN, lymphedema, diabetic neuropathy, GERD, venous insufficiency with BLE ulceratons. 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 He has more recent BUE tremor in past few months. MD assessment includes: acute UTI, syncope, generalized weakness, and hypokalemia.   OT comments  Mr. Smolinsky reports feeling "better" today and agreeable to working on bed mobility. Pt requires Max A for rolling L and R in supine position, Max A for repositioning LE laterally towards R in preparation for EOB sitting. Pt able to assist by reaching for bed rails with hand-over-hand assist and then helping to move trunk towards sitting posture. Ultimately unable to come into full sitting balance. Pt could benefit from +2 assist for bed mobility. After Max A for elevating LE to bed level, pt is better able to move LE laterally toward L, requiring only Mod A for this movement. Pt engages in UE therex in supine, performing biceps curls, cross-lateral punches, and forward flexion of shoulders. O2 sats initially 98% on 2L, reduced O2 to 1L with pt able to maintain O2 sats at 95%. Nurse notified. Pt left with NT coming into room. DC recs remain appropriate.   Recommendations for follow up therapy are one component of a multi-disciplinary discharge planning process, led by the attending physician.  Recommendations may be updated based on patient status, additional functional criteria and insurance authorization.    Follow Up Recommendations  Skilled nursing-short term rehab (<3 hours/day)     Assistance Recommended at Discharge Frequent or constant Supervision/Assistance  Patient can return home with the following  Two people to help with walking and/or transfers;Two people to help with  bathing/dressing/bathroom;Assistance with cooking/housework;Assist for transportation;Help with stairs or ramp for entrance;Assistance with feeding   Equipment Recommendations  None recommended by OT    Recommendations for Other Services      Precautions / Restrictions Precautions Precautions: Fall Precaution Comments: Una boots bilaterally Restrictions Weight Bearing Restrictions: No       Mobility Bed Mobility Overal bed mobility: Needs Assistance Bed Mobility: Rolling, Supine to Sit, Sit to Supine Rolling: Max assist   Supine to sit: Max assist, HOB elevated Sit to supine: Max assist        Transfers                   General transfer comment: pt unable     Balance Overall balance assessment: Needs assistance   Sitting balance-Leahy Scale: Poor Sitting balance - Comments: Max A for sitting EOB, w/ pt never able to obtain fully upright seated posture Postural control: Right lateral lean, Posterior lean   Standing balance-Leahy Scale: Zero Standing balance comment: Unable to stand                           ADL either performed or assessed with clinical judgement   ADL                       Lower Body Dressing: Maximal assistance Lower Body Dressing Details (indicate cue type and reason): donning socks               General ADL Comments: Pt requiring Max-Total A for all ADLs    Extremity/Trunk Assessment Upper Extremity Assessment Upper  Extremity Assessment: Generalized weakness   Lower Extremity Assessment Lower Extremity Assessment: Generalized weakness RLE Deficits / Details: pt with wound care wraps donned to bilateral lower legs and feet; pt reporting increased pain with movement, particularly with knee flexion LLE Deficits / Details: pt with wound care wraps donned to bilateral lower legs and feet; pt reporting increased pain with movement, particularly with knee flexion        Vision Baseline Vision/History: 2  Legally blind     Perception     Praxis      Cognition Arousal/Alertness: Awake/alert Behavior During Therapy: WFL for tasks assessed/performed Overall Cognitive Status: Within Functional Limits for tasks assessed                                 General Comments: 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        Exercises      Shoulder Instructions       General Comments      Pertinent Vitals/ Pain       Pain Assessment Pain Score: 3  Pain Location: b/l LE Pain Descriptors / Indicators: Aching, Sore Pain Intervention(s): Repositioned, Limited activity within patient's tolerance, Relaxation  Home Living                                          Prior Functioning/Environment              Frequency  Min 2X/week        Progress Toward Goals  OT Goals(current goals can now be found in the care plan section)  Progress towards OT goals: Progressing toward goals  Acute Rehab OT Goals OT Goal Formulation: With patient/family Time For Goal Achievement: 06/06/22 Potential to Achieve Goals: [place] Discharge plan remains appropriate;Frequency remains appropriate    Co-evaluation                 AM-PAC OT "6 Clicks" Daily Activity     Outcome Measure   Help from another person eating meals?: A Lot Help from another person taking care of personal grooming?: A Lot Help from another person toileting, which includes using toliet, bedpan, or urinal?: Total Help from another person bathing (including washing, rinsing, drying)?: A Lot Help from another person to put on and taking off regular upper body clothing?: A Lot Help from another person to put on and taking off regular lower body clothing?: A Lot 6 Click Score: 11    End of Session Equipment Utilized During Treatment: Oxygen  OT Visit Diagnosis: Other abnormalities of gait and mobility (R26.89);Muscle weakness (generalized) (M62.81) Pain - part of body: Leg   Activity  Tolerance Patient tolerated treatment well   Patient Left in bed;with call bell/phone within reach;with nursing/sitter in room   Nurse Communication          Time: 1442-1500 OT Time Calculation (min): 18 min  Charges: OT General Charges $OT Visit: 1 Visit OT Treatments $Self Care/Home Management : 8-22 mins  Josiah Lobo, PhD, MS, OTR/L 06/02/22, 3:12 PM

## 2022-06-02 NOTE — Progress Notes (Signed)
Physical Therapy Treatment Patient Details Name: Adam Merritt MRN: [id number] DOB: 14-Oct-1945 Today's Date: 06/02/2022   History of Present Illness Pt is a 76yoM who comes to [facility] 12/18 after a fall. PMH includes: DM2, CKD 3a, CAD, HTN, lymphedema, diabetic neuropathy, GERD, venous insufficiency with BLE ulceratons. 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 He has more recent BUE tremor in past few months. MD assessment includes: acute UTI, influenza, syncope, generalized weakness, and hypokalemia.    PT Comments    Pt was pleasant and motivated to participate during the session and put forth good effort throughout. Pt continued to require heavy +2 assist with bed mobility tasks.  Pt remained unable to clear the surface of the bed during sit to /from stand attempts but was able to clearly unweight his bottom from the mattress this session, a gross improvement from prior sessions.  Pt will benefit from PT services in a SNF setting upon discharge to safely address deficits listed in patient problem list for decreased caregiver assistance and eventual return to PLOF.     Recommendations for follow up therapy are one component of a multi-disciplinary discharge planning process, led by the attending physician.  Recommendations may be updated based on patient status, additional functional criteria and insurance authorization.  Follow Up Recommendations  Skilled nursing-short term rehab (<3 hours/day) Can patient physically be transported by private vehicle: No   Assistance Recommended at Discharge Frequent or constant Supervision/Assistance  Patient can return home with the following Two people to help with walking and/or transfers;Two people to help with bathing/dressing/bathroom;Assistance with cooking/housework;Assistance with feeding;Direct supervision/assist for medications management;Assist for transportation;Help with stairs or ramp for entrance   Equipment Recommendations  Other (comment)  (TBD)    Recommendations for Other Services       Precautions / Restrictions Precautions Precautions: Fall Precaution Comments: Una boots bilaterally Restrictions Weight Bearing Restrictions: No     Mobility  Bed Mobility Overal bed mobility: Needs Assistance Bed Mobility: Rolling, Supine to Sit, Sit to Supine Rolling: Max assist   Supine to sit: Max assist, HOB elevated, +2 for physical assistance Sit to supine: Max assist, +2 for physical assistance   General bed mobility comments: Heavy assist needed for BLE and trunk control with cues for use of bed rails    Transfers Overall transfer level: Needs assistance Equipment used: Rolling walker (2 wheels) Transfers: Sit to/from Stand Sit to Stand: Total assist, +2 physical assistance, From elevated surface           General transfer comment: Pt unable to clear the surface of the mattress even with +2 assist but grossly improved force applied through BLEs this session compared to prior session with pt clearly able to significantly unweight his bottom from the mattress    Ambulation/Gait               General Gait Details: Unable   Stairs             Wheelchair Mobility    Modified Rankin (Stroke Patients Only)       Balance Overall balance assessment: Needs assistance Sitting-balance support: Feet supported, Bilateral upper extremity supported Sitting balance-Leahy Scale: Poor Sitting balance - Comments: Occasional min to mod A for sitting balance at EOB                                    Cognition Arousal/Alertness: Awake/alert Behavior During Therapy: [facility]  for tasks assessed/performed Overall Cognitive Status: Within Functional Limits for tasks assessed                                          Exercises Total Joint Exercises Ankle Circles/Pumps: Strengthening, Both, 10 reps, 15 reps (with manual resistance) Quad Sets: Strengthening, Both, 10 reps, 15  reps Gluteal Sets: Strengthening, Both, 10 reps, 15 reps Hip ABduction/ADduction: AAROM, Strengthening, Both, 10 reps Straight Leg Raises: AAROM, Strengthening, Both, 10 reps Long Arc Quad: Strengthening, Both, 10 reps Knee Flexion: Strengthening, Both, 10 reps Other Exercises Other Exercises: Max standing effort at EOB x 5    General Comments        Pertinent Vitals/Pain Pain Assessment Pain Assessment: No/denies pain    Home Living                          Prior Function            PT Goals (current goals can now be found in the care plan section) Progress towards PT goals: Progressing toward goals    Frequency    Min 2X/week      PT Plan Current plan remains appropriate    Co-evaluation              AM-PAC PT "6 Clicks" Mobility   Outcome Measure  Help needed turning from your back to your side while in a flat bed without using bedrails?: Total Help needed moving from lying on your back to sitting on the side of a flat bed without using bedrails?: Total Help needed moving to and from a bed to a chair (including a wheelchair)?: Total Help needed standing up from a chair using your arms (e.g., wheelchair or bedside chair)?: Total Help needed to walk in hospital room?: Total Help needed climbing 3-5 steps with a railing? : Total 6 Click Score: 6    End of Session Equipment Utilized During Treatment: Gait belt Activity Tolerance: Patient tolerated treatment well Patient left: in bed;with call bell/phone within reach;with bed alarm set Nurse Communication: Mobility status PT Visit Diagnosis: Other abnormalities of gait and mobility (R26.89);Muscle weakness (generalized) (M62.81)     Time: 5732-2025 PT Time Calculation (min) (ACUTE ONLY): 27 min  Charges:  $Therapeutic Exercise: 8-22 mins $Therapeutic Activity: 8-22 mins                     D. Scott Birt Reinoso PT, DPT 06/02/22, 3:25 PM

## 2022-06-02 NOTE — Progress Notes (Signed)
Progress Note    Adam Merritt  NWG:[id number] DOB: [birth year]  DOA: 05/22/2022 PCP: [name], MD      Brief Narrative:    Medical records reviewed and are as summarized below:  Adam Merritt is a 76 y.o. male  with medical history significant for Bell's palsy,  diabetes mellitus with complications of stage IIIa chronic kidney disease, coronary artery disease, chronic diastolic CHF, history of stroke, hypertension, chronic lymphedema, diabetic neuropathy, GERD, history of venous insufficiency with bilateral lower extremity ulcerations.  He presented to the hospital because of generalized weakness, syncope and fall without head injury.  He also complained of urinary frequency and dysuria.     Assessment/Plan:   Principal Problem:   Syncope Active Problems:   Acute lower UTI   Dyslipidemia   Type 2 diabetes mellitus with peripheral neuropathy (HCC)   Fall   Weakness   Malnutrition of moderate degree   Influenza A   Pressure injury of skin    Body mass index is 48.09 kg/m.  (Morbid obesity)  Acute UTI: Urine culture showed Enterococcus faecalis and E. coli.  Completed 7 days of nitrofurantoin on 05/31/2022.  Initially received 3 days of IV Rocephin.  Renal ultrasound did not show any hydronephrosis, nephrolithiasis.    S/p syncope: Probably from general weakness   Generalized weakness: Continue PT and OT while inpatient.  Awaiting placement to SNF.  Follow-up with social worker to assist with disposition   Hypokalemia: Improved.   Cough, influenza A infection: Continue Tamiflu and droplet precautions.  Robitussin as needed for cough.  Zofran as needed for nausea.   Tremors of bilateral upper extremities: his wife said he was supposed to see a neurologist at [facility] in November, 2023. Follow up with neurologist as an outpatient  Other comorbidities include chronic hypoxic respiratory failure on 2 L/min oxygen at night, CAD, type II DM, CKD stage  IIIa, peripheral neuropathy, hyperlipidemia, chronic lymphedema lower extremities, history of stroke, chronic diastolic CHF (2D echo in August 2021 showed normal EF)    Diet Order             Diet 2 gram sodium Fluid consistency: Thin  Diet effective now                            Consultants: None  Procedures: None    Medications:    ascorbic acid  500 mg Oral BID   aspirin EC  325 mg Oral Daily   atorvastatin  40 mg Oral QHS   carvedilol  25 mg Oral BID WC   vitamin D3  1,000 Units Oral Daily   cyanocobalamin  1,000 mcg Oral Daily   docusate  50 mg Oral BID   enoxaparin (LOVENOX) injection  0.5 mg/kg Subcutaneous Q24H   feeding supplement  237 mL Oral TID BM   folic acid  1 mg Oral Daily   gabapentin  300 mg Oral BID   insulin aspart  0-9 Units Subcutaneous TID WC   insulin glargine-yfgn  30 Units Subcutaneous Daily   iron polysaccharides  150 mg Oral Daily   multivitamin with minerals  1 tablet Oral Daily   oseltamivir  75 mg Oral BID   pantoprazole  40 mg Oral Daily   pentoxifylline  400 mg Oral TID WC   prazosin  1 mg Oral QHS   sertraline  100 mg Oral Daily   traZODone  75 mg  Oral QHS   zinc sulfate  220 mg Oral Daily   Continuous Infusions:     Anti-infectives (From admission, onward)    Start     Dose/Rate Route Frequency Ordered Stop   05/30/22 2200  oseltamivir (TAMIFLU) capsule 75 mg        75 mg Oral 2 times daily 05/30/22 1835 06/04/22 2159   05/25/22 1500  nitrofurantoin (macrocrystal-monohydrate) (MACROBID) capsule 100 mg        100 mg Oral Every 12 hours 05/25/22 1412 05/31/22 2238   05/24/22 0200  cefTRIAXone (ROCEPHIN) 2 g in sodium chloride 0.9 % 100 mL IVPB  Status:  Discontinued        2 g 200 mL/hr over 30 Minutes Intravenous Every 24 hours 05/23/22 0217 05/25/22 1412   05/23/22 0230  cefTRIAXone (ROCEPHIN) 1 g in sodium chloride 0.9 % 100 mL IVPB        1 g 200 mL/hr over 30 Minutes Intravenous  Once 05/23/22 0229  05/23/22 0448   05/23/22 0130  cefTRIAXone (ROCEPHIN) 1 g in sodium chloride 0.9 % 100 mL IVPB        1 g 200 mL/hr over 30 Minutes Intravenous  Once 05/23/22 0122 05/23/22 0214              Family Communication/Anticipated D/C date and plan/Code Status   DVT prophylaxis:      Code Status: Full Code  Family Communication: Plan discussed with his wife at the bedside Disposition Plan: SNF at discharge   Status is: Inpatient Remains inpatient appropriate because: Needs SNF at discharge          Subjective:   Interval events noted.  He complains of cough and nausea.  Objective:     Vitals:   06/02/22 0507 06/02/22 0803 06/02/22 0824 06/02/22 1704  BP: (!) 128/58  138/67 (!) 142/64  Pulse: (!) 53 (!) 54 (!) 54 (!) 59  Resp: 16 20    Temp: (!) 96.9 F (36.1 C) 98.2 F (36.8 C)  98.2 F (36.8 C)  TempSrc:      SpO2: 97% 99%  98%  Weight:      Height:         No data found.    Intake/Output Summary (Last 24 hours) at 06/02/2022 1710 Last data filed at 06/01/2022 1723 Gross per 24 hour  Intake --  Output 400 ml  Net -400 ml    Filed Weights   05/22/22 1913  Weight: 131.1 kg    Exam:   GEN: NAD SKIN: Warm and dry EYES: EOMI ENT: MMM CV: RRR PULM: CTA B ABD: soft, obese, NT, +BS CNS: AAO x 3, non focal EXT: Lymphedema of bilateral legs     Pressure Injury 05/23/22 Buttocks Mid Stage 2 -  Partial thickness loss of dermis presenting as a shallow open injury with a red, pink wound bed without slough. (Active)  05/23/22 2000  Location: Buttocks  Location Orientation: Mid  Staging: Stage 2 -  Partial thickness loss of dermis presenting as a shallow open injury with a red, pink wound bed without slough.  Wound Description (Comments):   Present on Admission: Yes  Dressing Type Foam - Lift dressing to assess site every shift 06/02/22 0857     Data Reviewed:   I have personally reviewed following labs and imaging  studies:  Labs: Labs show the following:   Basic Metabolic Panel: Recent Labs  Lab 05/30/22 0747  NA 140  K 3.8  CL  105  CO2 27  GLUCOSE 71  BUN 22  CREATININE 1.18  CALCIUM 8.9  MG 2.3  PHOS 3.5   GFR Estimated Creatinine Clearance: 66.2 mL/min (by C-G formula based on SCr of 1.18 mg/dL). Liver Function Tests: No results for input(s): "AST", "ALT", "ALKPHOS", "BILITOT", "PROT", "ALBUMIN" in the last 168 hours.  No results for input(s): "LIPASE", "AMYLASE" in the last 168 hours. No results for input(s): "AMMONIA" in the last 168 hours.  Coagulation profile No results for input(s): "INR", "PROTIME" in the last 168 hours.  CBC: Recent Labs  Lab 05/30/22 0747  WBC 5.2  NEUTROABS 2.6  HGB 11.7*  HCT 36.6*  MCV 91.5  PLT 122*   Cardiac Enzymes: No results for input(s): "CKTOTAL", "CKMB", "CKMBINDEX", "TROPONINI" in the last 168 hours. BNP (last 3 results) No results for input(s): "PROBNP" in the last 8760 hours. CBG: Recent Labs  Lab 06/01/22 0755 06/01/22 1247 06/01/22 1614 06/02/22 0805 06/02/22 1219  GLUCAP 121* 104* 134* 82 101*   D-Dimer: No results for input(s): "DDIMER" in the last 72 hours. Hgb A1c: No results for input(s): "HGBA1C" in the last 72 hours. Lipid Profile: No results for input(s): "CHOL", "HDL", "LDLCALC", "TRIG", "CHOLHDL", "LDLDIRECT" in the last 72 hours. Thyroid function studies: No results for input(s): "TSH", "T4TOTAL", "T3FREE", "THYROIDAB" in the last 72 hours.  Invalid input(s): "FREET3" Anemia work up: No results for input(s): "VITAMINB12", "FOLATE", "FERRITIN", "TIBC", "IRON", "RETICCTPCT" in the last 72 hours. Sepsis Labs: Recent Labs  Lab 05/30/22 0747  WBC 5.2    Microbiology Recent Results (from the past 240 hour(s))  Resp panel by RT-PCR (RSV, Flu A&B, Covid) Anterior Nasal Swab     Status: Abnormal   Collection Time: 05/30/22  4:02 PM   Specimen: Anterior Nasal Swab  Result Value Ref Range Status   SARS  Coronavirus 2 by RT PCR NEGATIVE NEGATIVE Final    Comment: (NOTE) SARS-CoV-2 target nucleic acids are NOT DETECTED.  The SARS-CoV-2 RNA is generally detectable in upper respiratory specimens during the acute phase of infection. The lowest concentration of SARS-CoV-2 viral copies this assay can detect is 138 copies/mL. A negative result does not preclude SARS-Cov-2 infection and should not be used as the sole basis for treatment or other patient management decisions. A negative result may occur with  improper specimen collection/handling, submission of specimen other than nasopharyngeal swab, presence of viral mutation(s) within the areas targeted by this assay, and inadequate number of viral copies(<138 copies/mL). A negative result must be combined with clinical observations, patient history, and epidemiological information. The expected result is Negative.  Fact Sheet for Patients:  EntrepreneurPulse.com.au  Fact Sheet for Healthcare Providers:  IncredibleEmployment.be  This test is no t yet approved or cleared by the [place] FDA and  has been authorized for detection and/or diagnosis of SARS-CoV-2 by FDA under an Emergency Use Authorization (EUA). This EUA will remain  in effect (meaning this test can be used) for the duration of the COVID-19 declaration under Section 564(b)(1) of the Act, 21 U.S.C.section 360bbb-3(b)(1), unless the authorization is terminated  or revoked sooner.       Influenza A by PCR POSITIVE (A) NEGATIVE Final   Influenza B by PCR NEGATIVE NEGATIVE Final    Comment: (NOTE) The Xpert Xpress SARS-CoV-2/FLU/RSV plus assay is intended as an aid in the diagnosis of influenza from Nasopharyngeal swab specimens and should not be used as a sole basis for treatment. Nasal washings and aspirates are unacceptable for Xpert Xpress  SARS-CoV-2/FLU/RSV testing.  Fact Sheet for  Patients: EntrepreneurPulse.com.au  Fact Sheet for Healthcare Providers: IncredibleEmployment.be  This test is not yet approved or cleared by the [place] FDA and has been authorized for detection and/or diagnosis of SARS-CoV-2 by FDA under an Emergency Use Authorization (EUA). This EUA will remain in effect (meaning this test can be used) for the duration of the COVID-19 declaration under Section 564(b)(1) of the Act, 21 U.S.C. section 360bbb-3(b)(1), unless the authorization is terminated or revoked.     Resp Syncytial Virus by PCR NEGATIVE NEGATIVE Final    Comment: (NOTE) Fact Sheet for Patients: EntrepreneurPulse.com.au  Fact Sheet for Healthcare Providers: IncredibleEmployment.be  This test is not yet approved or cleared by the [place] FDA and has been authorized for detection and/or diagnosis of SARS-CoV-2 by FDA under an Emergency Use Authorization (EUA). This EUA will remain in effect (meaning this test can be used) for the duration of the COVID-19 declaration under Section 564(b)(1) of the Act, 21 U.S.C. section 360bbb-3(b)(1), unless the authorization is terminated or revoked.  Performed at [facility], [address] [place], [place] [address]     Procedures and diagnostic studies:  No results found.             LOS: 9 days   Tonya Wantz  [employer], advertising on [email or web address]. If 7PM-7AM, please contact night-coverage at [email or web address]     06/02/2022, 5:10 PM

## 2022-06-02 NOTE — Progress Notes (Signed)
CSW spoke with pts wife, Bethena Roys, and she states that she still wants pt to go to [facility].  She spoke with Lavella Lemons @ [facility] about [employer] insurance paying any co-pays and she confirmed to Bethena Roys that [employer] will pay any co-pays at the facility.  Waiting on auth and FL2 needs to be completed.

## 2022-06-03 DIAGNOSIS — R55 Syncope and collapse: Secondary | ICD-10-CM | POA: Diagnosis not present

## 2022-06-03 LAB — CBC WITH DIFFERENTIAL/PLATELET
Abs Immature Granulocytes: 0.04 10*3/uL (ref 0.00–0.07)
Basophils Absolute: 0 10*3/uL (ref 0.0–0.1)
Basophils Relative: 0 %
Eosinophils Absolute: 0.3 10*3/uL (ref 0.0–0.5)
Eosinophils Relative: 6 %
HCT: 36.1 % — ABNORMAL LOW (ref 39.0–52.0)
Hemoglobin: 11.4 g/dL — ABNORMAL LOW (ref 13.0–17.0)
Immature Granulocytes: 1 %
Lymphocytes Relative: 34 %
Lymphs Abs: 2 10*3/uL (ref 0.7–4.0)
MCH: 28.8 pg (ref 26.0–34.0)
MCHC: 31.6 g/dL (ref 30.0–36.0)
MCV: 91.2 fL (ref 80.0–100.0)
Monocytes Absolute: 0.5 10*3/uL (ref 0.1–1.0)
Monocytes Relative: 9 %
Neutro Abs: 2.9 10*3/uL (ref 1.7–7.7)
Neutrophils Relative %: 50 %
Platelets: 134 10*3/uL — ABNORMAL LOW (ref 150–400)
RBC: 3.96 MIL/uL — ABNORMAL LOW (ref 4.22–5.81)
RDW: 14.6 % (ref 11.5–15.5)
WBC: 5.7 10*3/uL (ref 4.0–10.5)
nRBC: 0 % (ref 0.0–0.2)

## 2022-06-03 LAB — BASIC METABOLIC PANEL
Anion gap: 4 — ABNORMAL LOW (ref 5–15)
BUN: 18 mg/dL (ref 8–23)
CO2: 30 mmol/L (ref 22–32)
Calcium: 8.8 mg/dL — ABNORMAL LOW (ref 8.9–10.3)
Chloride: 106 mmol/L (ref 98–111)
Creatinine, Ser: 1.16 mg/dL (ref 0.61–1.24)
GFR, Estimated: >60 mL/min (ref >60)
Glucose, Bld: 73 mg/dL (ref 70–99)
Potassium: 3.9 mmol/L (ref 3.5–5.1)
Sodium: 140 mmol/L (ref 135–145)

## 2022-06-03 LAB — GLUCOSE, CAPILLARY
Glucose-Capillary: 73 mg/dL (ref 70–99)
Glucose-Capillary: 121 mg/dL — ABNORMAL HIGH (ref 70–99)
Glucose-Capillary: 107 mg/dL — ABNORMAL HIGH (ref 70–99)
Glucose-Capillary: 61 mg/dL — ABNORMAL LOW (ref 70–99)

## 2022-06-03 MED ORDER — INSULIN GLARGINE-YFGN 100 UNIT/ML ~~LOC~~ SOLN
15.0000 [IU] | Freq: Every day | SUBCUTANEOUS | Status: DC
  Administered 2022-06-03 – 2022-06-08 (×6): 15 [IU] via SUBCUTANEOUS
  Filled 2022-06-03 (×6): qty 0.15

## 2022-06-03 NOTE — NC FL2 (Signed)
[place] LEVEL OF CARE FORM     IDENTIFICATION  Patient Name: Adam Merritt Birthdate: September 16, 1945 Sex: male Admission Date (Current Location): 05/22/2022  [facility] and [place] Number:  Engineering geologist and Address:  [facility], [address] [place], [place] [address]      Provider Number: [id number]  Attending Physician Name and Address:  George Hugh, MD  Relative Name and Phone Number:  Bridger, Pizzi (Spouse) [phone]    Current Level of Care: Hospital Recommended Level of Care: [place] Prior Approval Number:    Date Approved/Denied:   PASRR Number: [id number]  Discharge Plan: SNF    Current Diagnoses: Patient Active Problem List   Diagnosis Date Noted   Influenza A 05/31/2022   Pressure injury of skin 05/31/2022   Malnutrition of moderate degree 05/24/2022   Syncope 05/23/2022   Acute lower UTI 05/23/2022   Dyslipidemia 05/23/2022   Type 2 diabetes mellitus with peripheral neuropathy ([place]) 05/23/2022   Fall 05/23/2022   Weakness 05/23/2022   Anemia 05/01/2022   Obesity, Class III, BMI 40-49.9 (morbid obesity) ([place]) 05/01/2022   General weakness 04/28/2022   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 ([place]) 04/28/2022   Depression 04/28/2022   UTI (urinary tract infection) 04/28/2022   Self-care deficit for grooming and hygiene 03/28/2022   Recurrent falls 03/28/2022   Generalized weakness 03/28/2022   Unable to stand up 03/28/2022   PTSD (post-traumatic stress disorder) 12/08/2019   Type 2 diabetes mellitus with hyperglycemia ([place]) 03/19/2019   Aplastic anemia, unspecified ([place]) 03/19/2019   Acute pain of right knee 02/23/2019   Uncontrolled type 2 diabetes mellitus with hyperglycemia ([place]) 09/17/2018   Primary insomnia 08/07/2018   COPD (chronic obstructive pulmonary disease) ([place]) 05/13/2018   Dysuria 05/05/2018   Encounter for general adult medical examination with abnormal  findings 05/05/2018   CHF exacerbation ([place]) 04/16/2018   Achilles tendon disorder, right 04/02/2018   Essential hypertension 01/28/2018   Cellulitis and abscess of lower extremity 09/20/2017   Edema of both lower legs due to peripheral venous insufficiency 09/20/2017   Cellulitis 09/20/2017   Atopic dermatitis 09/02/2017   Chronic pain disorder 09/02/2017   CVA (cerebral vascular accident) ([place]) 09/15/2016   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   CVA (cerebral infarction) 02/12/2015   Chronic diastolic CHF (congestive heart failure) ([place]) 11/30/2014   Benign essential hypertension 11/06/2014   CAD (coronary artery disease) of artery bypass graft 01/19/2014   CKD stage 3 due to type 2 diabetes mellitus ([place]) 01/19/2014   Acute bronchitis with COPD ([place]) 01/19/2014   Diabetic neuropathy ([place]) 01/19/2014   Insulin dependent diabetes mellitus 01/19/2014   OSA (obstructive sleep apnea) 01/19/2014   Mixed hyperlipidemia 10/29/2013   Peripheral vascular disease, unspecified ([place]) 09/29/2013    Orientation RESPIRATION BLADDER Height & Weight     Self, Time, Situation, Place  O2 (1Liter) Continent Weight: 289 lb (131.1 kg) Height:  '5\' 5"'$  (165.1 cm)  BEHAVIORAL SYMPTOMS/MOOD NEUROLOGICAL BOWEL NUTRITION STATUS      Continent Diet (Sodium)  AMBULATORY STATUS COMMUNICATION OF NEEDS Skin   Extensive Assist Verbally PU Stage and Appropriate Care (Stage 2 wound on Buttocks)                       Personal Care Assistance Level of Assistance  Bathing, Feeding, Dressing Bathing Assistance: Maximum assistance Feeding assistance: Independent Dressing Assistance: Maximum assistance     Functional Limitations Info  Sight, Hearing, Speech  Sight Info: Impaired Hearing Info: Impaired Speech Info: Adequate    SPECIAL CARE FACTORS FREQUENCY                       Contractures Contractures Info: Not present    Additional Factors Info  Code Status, Allergies Code Status  Info: Full Allergies Info: No known listed           Current Medications (06/03/2022):  This is the current hospital active medication list Current Facility-Administered Medications  Medication Dose Route Frequency Provider Last Rate Last Admin   acetaminophen (TYLENOL) tablet 650 mg  650 mg Oral Q6H PRN Amin, Jeanella Flattery, MD   650 mg at 05/27/22 1714   ascorbic acid (VITAMIN C) tablet 500 mg  500 mg Oral BID Jennye Boroughs, MD   500 mg at 06/03/22 0920   aspirin EC tablet 325 mg  325 mg Oral Daily Duffy Bruce, MD   325 mg at 06/03/22 0920   atorvastatin (LIPITOR) tablet 40 mg  40 mg Oral QHS Duffy Bruce, MD   40 mg at 06/02/22 2035   carvedilol (COREG) tablet 25 mg  25 mg Oral BID WC Duffy Bruce, MD   25 mg at 06/02/22 1752   cholecalciferol (VITAMIN D3) 25 MCG (1000 UNIT) tablet 1,000 Units  1,000 Units Oral Daily Duffy Bruce, MD   1,000 Units at 06/03/22 0920   cyanocobalamin (VITAMIN B12) tablet 1,000 mcg  1,000 mcg Oral Daily Duffy Bruce, MD   1,000 mcg at 06/03/22 0919   docusate (COLACE) 50 MG/5ML liquid 50 mg  50 mg Oral BID Duffy Bruce, MD   50 mg at 06/02/22 2037   enoxaparin (LOVENOX) injection 65 mg  0.5 mg/kg Subcutaneous Q24H Mansy, Jan A, MD   65 mg at 06/03/22 0920   feeding supplement (ENSURE ENLIVE / ENSURE PLUS) liquid 237 mL  237 mL Oral TID BM Jennye Boroughs, MD   237 mL at 32/67/12 4580   folic acid (FOLVITE) tablet 1 mg  1 mg Oral Daily Duffy Bruce, MD   1 mg at 06/03/22 0920   gabapentin (NEURONTIN) capsule 300 mg  300 mg Oral BID Duffy Bruce, MD   300 mg at 06/03/22 0920   guaiFENesin (ROBITUSSIN) 100 MG/5ML liquid 5 mL  5 mL Oral Q4H PRN Amin, Ankit Chirag, MD   5 mL at 05/30/22 1408   hydrALAZINE (APRESOLINE) injection 10 mg  10 mg Intravenous Q4H PRN Amin, Ankit Chirag, MD       insulin aspart (novoLOG) injection 0-9 Units  0-9 Units Subcutaneous TID WC Amin, Ankit Chirag, MD   1 Units at 06/03/22 1300   insulin glargine-yfgn  (SEMGLEE) injection 15 Units  15 Units Subcutaneous Daily George Hugh, MD   15 Units at 06/03/22 9983   ipratropium-albuterol (DUONEB) 0.5-2.5 (3) MG/3ML nebulizer solution 3 mL  3 mL Nebulization Q4H PRN Amin, Ankit Chirag, MD   3 mL at 05/27/22 1054   iron polysaccharides (NIFEREX) capsule 150 mg  150 mg Oral Daily Duffy Bruce, MD   150 mg at 06/03/22 0920   magnesium hydroxide (MILK OF MAGNESIA) suspension 30 mL  30 mL Oral Daily PRN Mansy, Jan A, MD       metoprolol tartrate (LOPRESSOR) injection 5 mg  5 mg Intravenous Q4H PRN Amin, Jeanella Flattery, MD       multivitamin with minerals tablet 1 tablet  1 tablet Oral Daily Mansy, Jan A, MD   1 tablet at 06/03/22 [phone]  ondansetron (ZOFRAN) tablet 4 mg  4 mg Oral Q6H PRN Mansy, Jan A, MD   4 mg at 06/02/22 1112   Or   ondansetron (ZOFRAN) injection 4 mg  4 mg Intravenous Q6H PRN Mansy, Jan A, MD   4 mg at 05/27/22 1501   Oral care mouth rinse  15 mL Mouth Rinse PRN Jennye Boroughs, MD       oseltamivir (TAMIFLU) capsule 75 mg  75 mg Oral BID Jennye Boroughs, MD   75 mg at 06/03/22 0920   pantoprazole (PROTONIX) EC tablet 40 mg  40 mg Oral Daily Duffy Bruce, MD   40 mg at 06/03/22 0920   pentoxifylline (TRENTAL) CR tablet 400 mg  400 mg Oral TID WC Duffy Bruce, MD   400 mg at 06/03/22 1228   prazosin (MINIPRESS) capsule 1 mg  1 mg Oral QHS Duffy Bruce, MD   1 mg at 06/02/22 2035   senna-docusate (Senokot-S) tablet 1 tablet  1 tablet Oral QHS PRN Amin, Ankit Chirag, MD       sertraline (ZOLOFT) tablet 100 mg  100 mg Oral Daily Duffy Bruce, MD   100 mg at 06/03/22 0919   traZODone (DESYREL) tablet 75 mg  75 mg Oral Gloriajean Dell, MD   75 mg at 06/02/22 2035   zinc sulfate capsule 220 mg  220 mg Oral Daily Jennye Boroughs, MD   220 mg at 06/03/22 4656     Discharge Medications: Please see discharge summary for a list of discharge medications.  Relevant Imaging Results:  Relevant Lab Results:   Additional  Information Patient SS# [id number].  Raina Mina, LCSWA

## 2022-06-03 NOTE — Progress Notes (Signed)
Progress Note    Adam Merritt  FXT:[id number] DOB: 12-27-1945  DOA: 05/22/2022 PCP: [name], MD    Brief Narrative:    Medical records reviewed and are as summarized below:  Adam Merritt is a 76 y.o. 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  Assessment/Plan:   Principal Problem:   Syncope Active Problems:   Acute lower UTI   Dyslipidemia   Type 2 diabetes mellitus with peripheral neuropathy (HCC)   Fall   Weakness   Malnutrition of moderate degree   Influenza A   Pressure injury of skin   E. Faecalis and E. Coli UTI: Patient has history of multiple UTIs. - S/P 7 days of Nitrofurantoin 12/21-12/27.  Influenza A: - On Day 5 of Oseltamivir.  Physical Deconditioning / Wheelchair Bound: - PT/OT recommends SNF with short term Rehab - placement is pending authorization  Diabetes Mellitus Type 2 with chronic insulin use: - Glucose is around 73 mmol/L.  Decrease Lantus from 30 to 15 units daily.  Essential Hypertension:  - BP is stable.  History of CVA: - Aspirin and Statin.  CAD s/p 2013 CABG: - Aspirin, BB, Statin.  PVD: - Aspirin and Statin.  Hyperlipidemia: LDL 61, HDL 21, Triglycerides 183. - Statin.  Chronic Diastolic Heart Failure: - Continue BB and diuretics.  CKD3: - Creatinine is stable.  05/24/22  05/25/22  05/30/22  06/03/22   Creatinine 1.41 (H) 1.36 (H) 1.18 1.16   Morbid Obesity (BMI 48.09 kg/m2) / OSA: - Patient is prescribed CPAP machine but does not use it. - Patient was counseled on adherence.  Chronic Lymphedema / Venous Stasis Ulcers: - Change Unna boots weekly.  Appreciate Wound Care.  Bilateral Upper Extremity  Tremors: - Follows with Neurology outpatient.  GERD:  - PPI  MDD: - Sertraline 100 mg daily.  Insomnia: - Trazodone 75 mg nightly   Diet Order             Diet 2 gram sodium Fluid consistency: Thin  Diet effective now                  Consultants: None  Procedures: None   Medications:    ascorbic acid  500 mg Oral BID   aspirin EC  325 mg Oral Daily   atorvastatin  40 mg Oral QHS   carvedilol  25 mg Oral BID WC   vitamin D3  1,000 Units Oral Daily   cyanocobalamin  1,000 mcg Oral Daily   docusate  50 mg Oral BID   enoxaparin (LOVENOX) injection  0.5 mg/kg Subcutaneous Q24H   feeding supplement  237 mL Oral TID BM   folic acid  1 mg Oral Daily   gabapentin  300 mg Oral BID   insulin aspart  0-9 Units Subcutaneous TID WC   insulin glargine-yfgn  15 Units Subcutaneous Daily   iron polysaccharides  150 mg Oral Daily   multivitamin with minerals  1 tablet Oral Daily   oseltamivir  75 mg Oral BID   pantoprazole  40 mg Oral Daily   pentoxifylline  400 mg Oral TID WC   prazosin  1 mg Oral QHS  sertraline  100 mg Oral Daily   traZODone  75 mg Oral QHS   zinc sulfate  220 mg Oral Daily   Continuous Infusions:  Anti-infectives (From admission, onward)    Start     Dose/Rate Route Frequency Ordered Stop   05/30/22 2200  oseltamivir (TAMIFLU) capsule 75 mg        75 mg Oral 2 times daily 05/30/22 1835 06/04/22 2159   05/25/22 1500  nitrofurantoin (macrocrystal-monohydrate) (MACROBID) capsule 100 mg        100 mg Oral Every 12 hours 05/25/22 1412 05/31/22 2238   05/24/22 0200  cefTRIAXone (ROCEPHIN) 2 g in sodium chloride 0.9 % 100 mL IVPB  Status:  Discontinued        2 g 200 mL/hr over 30 Minutes Intravenous Every 24 hours 05/23/22 0217 05/25/22 1412   05/23/22 0230  cefTRIAXone (ROCEPHIN) 1 g in sodium chloride 0.9 % 100 mL IVPB        1 g 200 mL/hr over 30 Minutes Intravenous  Once 05/23/22 0229 05/23/22 0448   05/23/22 0130  cefTRIAXone (ROCEPHIN) 1 g in  sodium chloride 0.9 % 100 mL IVPB        1 g 200 mL/hr over 30 Minutes Intravenous  Once 05/23/22 0122 05/23/22 0214        Family Communication/Anticipated D/C date and plan/Code Status   DVT prophylaxis:      Code Status: Full Code  Family Communication: Plan discussed with his wife at the bedside Disposition Plan: SNF at discharge   Status is: Inpatient Remains inpatient appropriate because: pending SNF placement.   Subjective:   Interval events noted.  Adam Merritt is resting comfortably this morning.  He denies any chest pain or shortness of breath.  Objective:     Vitals:   06/03/22 0638 06/03/22 0803 06/03/22 0807 06/03/22 1247  BP: 138/60 (!) 137/55 (!) 129/56 (!) 126/57  Pulse: (!) 53 (!) 55 (!) 55 (!) 59  Resp: 18     Temp: 97.7 F (36.5 C) 97.7 F (36.5 C)  98.2 F (36.8 C)  TempSrc:      SpO2: 97% 98% 98% 98%  Weight:      Height:         Orthostatic VS for the past 24 hrs:  BP- Lying Pulse- Lying BP- Sitting Pulse- Sitting  06/03/22 0800 137/55 55 129/56 55      Intake/Output Summary (Last 24 hours) at 06/03/2022 1355 Last data filed at 06/02/2022 2225 Gross per 24 hour  Intake --  Output 500 ml  Net -500 ml     Filed Weights   05/22/22 1913  Weight: 131.1 kg    Exam:   Physical Exam Constitutional:      Appearance: He is obese.     Comments: Chronically ill appearing  HENT:     Head: Normocephalic and atraumatic.     Mouth/Throat:     Mouth: Mucous membranes are moist.  Eyes:     Extraocular Movements: Extraocular movements intact.     Pupils: Pupils are equal, round, and reactive to light.  Cardiovascular:     Rate and Rhythm: Normal rate and regular rhythm.  Pulmonary:     Effort: Pulmonary effort is normal.     Breath sounds: Normal breath sounds.  Abdominal:     General: Bowel sounds are normal.     Palpations: Abdomen is soft.  Musculoskeletal:     Cervical back: Normal range of motion and neck supple.  Skin:    General: Skin is warm and dry.     Capillary Refill: Capillary refill takes less than 2 seconds.  Neurological:     General: No focal deficit present.  Psychiatric:        Mood and Affect: Mood normal.       Pressure Injury 05/23/22 Buttocks Mid Stage 2 -  Partial thickness loss of dermis presenting as a shallow open injury with a red, pink wound bed without slough. (Active)  05/23/22 2000  Location: Buttocks  Location Orientation: Mid  Staging: Stage 2 -  Partial thickness loss of dermis presenting as a shallow open injury with a red, pink wound bed without slough.  Wound Description (Comments):   Present on Admission: Yes  Dressing Type Foam - Lift dressing to assess site every shift 06/03/22 0920     Data Reviewed:   I have personally reviewed following labs and imaging studies:  Labs: Labs show the following:   Basic Metabolic Panel: Recent Labs  Lab 05/30/22 0747 06/03/22 0637  NA 140 140  K 3.8 3.9  CL 105 106  CO2 27 30  GLUCOSE 71 73  BUN 22 18  CREATININE 1.18 1.16  CALCIUM 8.9 8.8*  MG 2.3  --   PHOS 3.5  --     GFR Estimated Creatinine Clearance: 67.4 mL/min (by C-G formula based on SCr of 1.16 mg/dL). Liver Function Tests: No results for input(s): "AST", "ALT", "ALKPHOS", "BILITOT", "PROT", "ALBUMIN" in the last 168 hours.  No results for input(s): "LIPASE", "AMYLASE" in the last 168 hours. No results for input(s): "AMMONIA" in the last 168 hours.  Coagulation profile No results for input(s): "INR", "PROTIME" in the last 168 hours.  CBC: Recent Labs  Lab 05/30/22 0747 06/03/22 0637  WBC 5.2 5.7  NEUTROABS 2.6 2.9  HGB 11.7* 11.4*  HCT 36.6* 36.1*  MCV 91.5 91.2  PLT 122* 134*    Cardiac Enzymes: No results for input(s): "CKTOTAL", "CKMB", "CKMBINDEX", "TROPONINI" in the last 168 hours. BNP (last 3 results) No results for input(s): "PROBNP" in the last 8760 hours. CBG: Recent Labs  Lab 06/02/22 1219 06/02/22 1716  06/02/22 2204 06/03/22 0758 06/03/22 1244  GLUCAP 101* 91 83 73 121*    D-Dimer: No results for input(s): "DDIMER" in the last 72 hours. Hgb A1c: No results for input(s): "HGBA1C" in the last 72 hours. Lipid Profile: No results for input(s): "CHOL", "HDL", "LDLCALC", "TRIG", "CHOLHDL", "LDLDIRECT" in the last 72 hours. Thyroid function studies: No results for input(s): "TSH", "T4TOTAL", "T3FREE", "THYROIDAB" in the last 72 hours.  Invalid input(s): "FREET3" Anemia work up: No results for input(s): "VITAMINB12", "FOLATE", "FERRITIN", "TIBC", "IRON", "RETICCTPCT" in the last 72 hours. Sepsis Labs: Recent Labs  Lab 05/30/22 0747 06/03/22 0637  WBC 5.2 5.7     Microbiology Recent Results (from the past 240 hour(s))  Resp panel by RT-PCR (RSV, Flu A&B, Covid) Anterior Nasal Swab     Status: Abnormal   Collection Time: 05/30/22  4:02 PM   Specimen: Anterior Nasal Swab  Result Value Ref Range Status   SARS Coronavirus 2 by RT PCR NEGATIVE NEGATIVE Final    Comment: (NOTE) SARS-CoV-2 target nucleic acids are NOT DETECTED.  The SARS-CoV-2 RNA is generally detectable in upper respiratory specimens during the acute phase of infection. The lowest concentration of SARS-CoV-2 viral copies this assay can detect is 138 copies/mL. A negative result does not preclude SARS-Cov-2 infection and should not be used as the sole basis for  treatment or other patient management decisions. A negative result may occur with  improper specimen collection/handling, submission of specimen other than nasopharyngeal swab, presence of viral mutation(s) within the areas targeted by this assay, and inadequate number of viral copies(<138 copies/mL). A negative result must be combined with clinical observations, patient history, and epidemiological information. The expected result is Negative.  Fact Sheet for Patients:  EntrepreneurPulse.com.au  Fact Sheet for Healthcare Providers:   IncredibleEmployment.be  This test is no t yet approved or cleared by the [place] FDA and  has been authorized for detection and/or diagnosis of SARS-CoV-2 by FDA under an Emergency Use Authorization (EUA). This EUA will remain  in effect (meaning this test can be used) for the duration of the COVID-19 declaration under Section 564(b)(1) of the Act, 21 U.S.C.section 360bbb-3(b)(1), unless the authorization is terminated  or revoked sooner.       Influenza A by PCR POSITIVE (A) NEGATIVE Final   Influenza B by PCR NEGATIVE NEGATIVE Final    Comment: (NOTE) The Xpert Xpress SARS-CoV-2/FLU/RSV plus assay is intended as an aid in the diagnosis of influenza from Nasopharyngeal swab specimens and should not be used as a sole basis for treatment. Nasal washings and aspirates are unacceptable for Xpert Xpress SARS-CoV-2/FLU/RSV testing.  Fact Sheet for Patients: EntrepreneurPulse.com.au  Fact Sheet for Healthcare Providers: IncredibleEmployment.be  This test is not yet approved or cleared by the [place] FDA and has been authorized for detection and/or diagnosis of SARS-CoV-2 by FDA under an Emergency Use Authorization (EUA). This EUA will remain in effect (meaning this test can be used) for the duration of the COVID-19 declaration under Section 564(b)(1) of the Act, 21 U.S.C. section 360bbb-3(b)(1), unless the authorization is terminated or revoked.     Resp Syncytial Virus by PCR NEGATIVE NEGATIVE Final    Comment: (NOTE) Fact Sheet for Patients: EntrepreneurPulse.com.au  Fact Sheet for Healthcare Providers: IncredibleEmployment.be  This test is not yet approved or cleared by the [place] FDA and has been authorized for detection and/or diagnosis of SARS-CoV-2 by FDA under an Emergency Use Authorization (EUA). This EUA will remain in effect (meaning this test can be used)  for the duration of the COVID-19 declaration under Section 564(b)(1) of the Act, 21 U.S.C. section 360bbb-3(b)(1), unless the authorization is terminated or revoked.  Performed at [facility], [address] [place], [place] [address]     Procedures and diagnostic studies:  No results found.             LOS: 10 days   George Hugh  [facility]   Pager on [email or web address]. If 7PM-7AM, please contact night-coverage at [email or web address]     06/03/2022, 1:55 PM

## 2022-06-04 LAB — GLUCOSE, CAPILLARY
Glucose-Capillary: 105 mg/dL — ABNORMAL HIGH (ref 70–99)
Glucose-Capillary: 112 mg/dL — ABNORMAL HIGH (ref 70–99)
Glucose-Capillary: 120 mg/dL — ABNORMAL HIGH (ref 70–99)
Glucose-Capillary: 123 mg/dL — ABNORMAL HIGH (ref 70–99)
Glucose-Capillary: 130 mg/dL — ABNORMAL HIGH (ref 70–99)
Glucose-Capillary: 161 mg/dL — ABNORMAL HIGH (ref 70–99)

## 2022-06-04 MED ORDER — BENZONATATE 100 MG PO CAPS
100.0000 mg | ORAL_CAPSULE | Freq: Three times a day (TID) | ORAL | Status: DC | PRN
  Administered 2022-06-04: 100 mg via ORAL
  Filled 2022-06-04 (×2): qty 1

## 2022-06-04 NOTE — Progress Notes (Signed)
.    Progress Note    Adam Merritt  XVQ:[id number] DOB: July 01, 1945  DOA: 05/22/2022 PCP: [name], MD    Brief Narrative:    Medical records reviewed and are as summarized below:  Adam Merritt is a 76 y.o. 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  Assessment/Plan:   Principal Problem:   Syncope Active Problems:   Acute lower UTI   Dyslipidemia   Type 2 diabetes mellitus with peripheral neuropathy (HCC)   Fall   Weakness   Malnutrition of moderate degree   Influenza A   Pressure injury of skin   E. Faecalis and E. Coli UTI: Patient has history of multiple UTIs. - S/P 7 days of Nitrofurantoin 12/21-12/27.  Influenza A: - On Day 5 of Oseltamivir.Last day today.   Physical Deconditioning / Wheelchair Bound: - PT/OT recommends SNF with short term Rehab - placement is pending authorization  Diabetes Mellitus Type 2 with chronic insulin use: - Glucose is around 73 mmol/L.  Decrease Lantus from 30 to 15 units daily.  Essential Hypertension:  - BP is stable.  History of CVA: - Aspirin and Statin.  CAD s/p 2013 CABG: - Aspirin, BB, Statin.  PVD: - Aspirin and Statin.  Hyperlipidemia: LDL 61, HDL 21, Triglycerides 183. - Statin.  Chronic Diastolic Heart Failure: - Continue BB and diuretics.  CKD3: - Creatinine is stable.  05/24/22  05/25/22  05/30/22  06/03/22   Creatinine 1.41 (H) 1.36 (H) 1.18 1.16   Morbid Obesity (BMI 48.09 kg/m2) / OSA: - Patient is prescribed CPAP machine but does not use it. - Patient was counseled on adherence.  Chronic Lymphedema / Venous Stasis Ulcers: - Change Unna boots weekly.  Appreciate Wound Care.  Bilateral Upper  Extremity Tremors: - Follows with Neurology outpatient.  GERD:  - PPI  MDD: - Sertraline 100 mg daily.  Insomnia: - Trazodone 75 mg nightly   Diet Order             Diet 2 gram sodium Fluid consistency: Thin  Diet effective now                  Consultants: None  Procedures: None   Medications:    ascorbic acid  500 mg Oral BID   aspirin EC  325 mg Oral Daily   atorvastatin  40 mg Oral QHS   carvedilol  25 mg Oral BID WC   vitamin D3  1,000 Units Oral Daily   cyanocobalamin  1,000 mcg Oral Daily   docusate  50 mg Oral BID   enoxaparin (LOVENOX) injection  0.5 mg/kg Subcutaneous Q24H   feeding supplement  237 mL Oral TID BM   folic acid  1 mg Oral Daily   gabapentin  300 mg Oral BID   insulin aspart  0-9 Units Subcutaneous TID WC   insulin glargine-yfgn  15 Units Subcutaneous Daily   iron polysaccharides  150 mg Oral Daily   multivitamin with minerals  1 tablet Oral Daily   pantoprazole  40 mg Oral Daily   pentoxifylline  400 mg Oral TID WC   prazosin  1 mg Oral QHS   sertraline  100 mg  Oral Daily   traZODone  75 mg Oral QHS   zinc sulfate  220 mg Oral Daily   Continuous Infusions:  Anti-infectives (From admission, onward)    Start     Dose/Rate Route Frequency Ordered Stop   05/30/22 2200  oseltamivir (TAMIFLU) capsule 75 mg        75 mg Oral 2 times daily 05/30/22 1835 06/04/22 0949   05/25/22 1500  nitrofurantoin (macrocrystal-monohydrate) (MACROBID) capsule 100 mg        100 mg Oral Every 12 hours 05/25/22 1412 05/31/22 2238   05/24/22 0200  cefTRIAXone (ROCEPHIN) 2 g in sodium chloride 0.9 % 100 mL IVPB  Status:  Discontinued        2 g 200 mL/hr over 30 Minutes Intravenous Every 24 hours 05/23/22 0217 05/25/22 1412   05/23/22 0230  cefTRIAXone (ROCEPHIN) 1 g in sodium chloride 0.9 % 100 mL IVPB        1 g 200 mL/hr over 30 Minutes Intravenous  Once 05/23/22 0229 05/23/22 0448   05/23/22 0130  cefTRIAXone (ROCEPHIN) 1 g in sodium chloride 0.9  % 100 mL IVPB        1 g 200 mL/hr over 30 Minutes Intravenous  Once 05/23/22 0122 05/23/22 0214        Family Communication/Anticipated D/C date and plan/Code Status   DVT prophylaxis:      Code Status: Full Code  Family Communication: Plan discussed with his wife at the bedside Disposition Plan: SNF at discharge   Status is: Inpatient Remains inpatient appropriate because: pending SNF placement.   Subjective:   Pt seen this morning. No overnight events. Vitals and labs reviewed. Pt vitals stable, Labs in reference range. Medically ready to be d/c. Pending placement.   Objective:     Vitals:   06/03/22 1855 06/03/22 2104 06/04/22 0545 06/04/22 1024  BP: 139/68 113/81 (!) 121/46 (!) 129/57  Pulse: 61 (!) 58 (!) 58 64  Resp: '16 16 18 17  '$ Temp: 98.5 F (36.9 C) 98.2 F (36.8 C) 98.4 F (36.9 C) 97.9 F (36.6 C)  TempSrc: Oral  Oral   SpO2: 98% 97% 92% 100%  Weight:      Height:         No data found.     Intake/Output Summary (Last 24 hours) at 06/04/2022 1209 Last data filed at 06/04/2022 8527 Gross per 24 hour  Intake --  Output 600 ml  Net -600 ml     Filed Weights   05/22/22 1913  Weight: 131.1 kg    Exam:   Physical Exam Constitutional:      Appearance: He is obese.     Comments: Chronically ill appearing  HENT:     Head: Normocephalic and atraumatic.     Mouth/Throat:     Mouth: Mucous membranes are moist.  Eyes:     Extraocular Movements: Extraocular movements intact.     Pupils: Pupils are equal, round, and reactive to light.  Cardiovascular:     Rate and Rhythm: Normal rate and regular rhythm.  Pulmonary:     Effort: Pulmonary effort is normal.     Breath sounds: Normal breath sounds.  Abdominal:     General: Bowel sounds are normal.     Palpations: Abdomen is soft.  Musculoskeletal:     Cervical back: Normal range of motion and neck supple.  Skin:    General: Skin is warm and dry.     Capillary Refill: Capillary  refill takes  less than 2 seconds.  Neurological:     General: No focal deficit present.  Psychiatric:        Mood and Affect: Mood normal.       Pressure Injury 05/23/22 Buttocks Mid Stage 2 -  Partial thickness loss of dermis presenting as a shallow open injury with a red, pink wound bed without slough. (Active)  05/23/22 2000  Location: Buttocks  Location Orientation: Mid  Staging: Stage 2 -  Partial thickness loss of dermis presenting as a shallow open injury with a red, pink wound bed without slough.  Wound Description (Comments):   Present on Admission: Yes  Dressing Type Foam - Lift dressing to assess site every shift 06/04/22 1030     Data Reviewed:   I have personally reviewed following labs and imaging studies:  Labs: Labs show the following:   Basic Metabolic Panel: Recent Labs  Lab 05/30/22 0747 06/03/22 0637  NA 140 140  K 3.8 3.9  CL 105 106  CO2 27 30  GLUCOSE 71 73  BUN 22 18  CREATININE 1.18 1.16  CALCIUM 8.9 8.8*  MG 2.3  --   PHOS 3.5  --     GFR Estimated Creatinine Clearance: 67.4 mL/min (by C-G formula based on SCr of 1.16 mg/dL). Liver Function Tests: No results for input(s): "AST", "ALT", "ALKPHOS", "BILITOT", "PROT", "ALBUMIN" in the last 168 hours.  No results for input(s): "LIPASE", "AMYLASE" in the last 168 hours. No results for input(s): "AMMONIA" in the last 168 hours.  Coagulation profile No results for input(s): "INR", "PROTIME" in the last 168 hours.  CBC: Recent Labs  Lab 05/30/22 0747 06/03/22 0637  WBC 5.2 5.7  NEUTROABS 2.6 2.9  HGB 11.7* 11.4*  HCT 36.6* 36.1*  MCV 91.5 91.2  PLT 122* 134*    Cardiac Enzymes: No results for input(s): "CKTOTAL", "CKMB", "CKMBINDEX", "TROPONINI" in the last 168 hours. BNP (last 3 results) No results for input(s): "PROBNP" in the last 8760 hours. CBG: Recent Labs  Lab 06/03/22 1746 06/03/22 2105 06/04/22 0028 06/04/22 0926 06/04/22 1137  GLUCAP 107* 61* 120* 105* 130*     D-Dimer: No results for input(s): "DDIMER" in the last 72 hours. Hgb A1c: No results for input(s): "HGBA1C" in the last 72 hours. Lipid Profile: No results for input(s): "CHOL", "HDL", "LDLCALC", "TRIG", "CHOLHDL", "LDLDIRECT" in the last 72 hours. Thyroid function studies: No results for input(s): "TSH", "T4TOTAL", "T3FREE", "THYROIDAB" in the last 72 hours.  Invalid input(s): "FREET3" Anemia work up: No results for input(s): "VITAMINB12", "FOLATE", "FERRITIN", "TIBC", "IRON", "RETICCTPCT" in the last 72 hours. Sepsis Labs: Recent Labs  Lab 05/30/22 0747 06/03/22 0637  WBC 5.2 5.7     Microbiology Recent Results (from the past 240 hour(s))  Resp panel by RT-PCR (RSV, Flu A&B, Covid) Anterior Nasal Swab     Status: Abnormal   Collection Time: 05/30/22  4:02 PM   Specimen: Anterior Nasal Swab  Result Value Ref Range Status   SARS Coronavirus 2 by RT PCR NEGATIVE NEGATIVE Final    Comment: (NOTE) SARS-CoV-2 target nucleic acids are NOT DETECTED.  The SARS-CoV-2 RNA is generally detectable in upper respiratory specimens during the acute phase of infection. The lowest concentration of SARS-CoV-2 viral copies this assay can detect is 138 copies/mL. A negative result does not preclude SARS-Cov-2 infection and should not be used as the sole basis for treatment or other patient management decisions. A negative result may occur with  improper specimen collection/handling, submission of specimen  other than nasopharyngeal swab, presence of viral mutation(s) within the areas targeted by this assay, and inadequate number of viral copies(<138 copies/mL). A negative result must be combined with clinical observations, patient history, and epidemiological information. The expected result is Negative.  Fact Sheet for Patients:  EntrepreneurPulse.com.au  Fact Sheet for Healthcare Providers:  IncredibleEmployment.be  This test is no t yet  approved or cleared by the [place] FDA and  has been authorized for detection and/or diagnosis of SARS-CoV-2 by FDA under an Emergency Use Authorization (EUA). This EUA will remain  in effect (meaning this test can be used) for the duration of the COVID-19 declaration under Section 564(b)(1) of the Act, 21 U.S.C.section 360bbb-3(b)(1), unless the authorization is terminated  or revoked sooner.       Influenza A by PCR POSITIVE (A) NEGATIVE Final   Influenza B by PCR NEGATIVE NEGATIVE Final    Comment: (NOTE) The Xpert Xpress SARS-CoV-2/FLU/RSV plus assay is intended as an aid in the diagnosis of influenza from Nasopharyngeal swab specimens and should not be used as a sole basis for treatment. Nasal washings and aspirates are unacceptable for Xpert Xpress SARS-CoV-2/FLU/RSV testing.  Fact Sheet for Patients: EntrepreneurPulse.com.au  Fact Sheet for Healthcare Providers: IncredibleEmployment.be  This test is not yet approved or cleared by the [place] FDA and has been authorized for detection and/or diagnosis of SARS-CoV-2 by FDA under an Emergency Use Authorization (EUA). This EUA will remain in effect (meaning this test can be used) for the duration of the COVID-19 declaration under Section 564(b)(1) of the Act, 21 U.S.C. section 360bbb-3(b)(1), unless the authorization is terminated or revoked.     Resp Syncytial Virus by PCR NEGATIVE NEGATIVE Final    Comment: (NOTE) Fact Sheet for Patients: EntrepreneurPulse.com.au  Fact Sheet for Healthcare Providers: IncredibleEmployment.be  This test is not yet approved or cleared by the [place] FDA and has been authorized for detection and/or diagnosis of SARS-CoV-2 by FDA under an Emergency Use Authorization (EUA). This EUA will remain in effect (meaning this test can be used) for the duration of the COVID-19 declaration under Section 564(b)(1)  of the Act, 21 U.S.C. section 360bbb-3(b)(1), unless the authorization is terminated or revoked.  Performed at [facility], [address] [place], [place] [address]     Procedures and diagnostic studies:  No results found.   LOS: 11 days   [facility]   Pager on [email or web address]. If 7PM-7AM, please contact night-coverage at [email or web address]     06/04/2022, 12:09 PM

## 2022-06-05 DIAGNOSIS — J101 Influenza due to other identified influenza virus with other respiratory manifestations: Secondary | ICD-10-CM | POA: Diagnosis not present

## 2022-06-05 LAB — GLUCOSE, CAPILLARY
Glucose-Capillary: 125 mg/dL — ABNORMAL HIGH (ref 70–99)
Glucose-Capillary: 187 mg/dL — ABNORMAL HIGH (ref 70–99)
Glucose-Capillary: 138 mg/dL — ABNORMAL HIGH (ref 70–99)
Glucose-Capillary: 147 mg/dL — ABNORMAL HIGH (ref 70–99)
Glucose-Capillary: 122 mg/dL — ABNORMAL HIGH (ref 70–99)

## 2022-06-05 NOTE — TOC Progression Note (Signed)
Transition of Care [facility]) - Progression Note    Patient Details  Name: CHANEL MCKESSON MRN: [id number] Date of Birth: 08-23-1945  [facility] of [facility]) CM/SW Contact  Gerilyn Pilgrim, LCSW Phone Number: 06/05/2022, 10:05 AM  Clinical Narrative:   Josem Kaufmann still pending. TOC will continue to follow.     Expected Discharge Plan: [place] Barriers to Discharge: Continued Medical Work up  Expected Discharge Plan and Services     Post Acute Care Choice:  (unknown at this time) Living arrangements for the past 2 months: [place] Determinants of Health (SDOH) Interventions [place]: No Food Insecurity (05/23/2022)  Housing: Low Risk  (05/23/2022)  Transportation Needs: No Transportation Needs (05/23/2022)  Utilities: Not At Risk (05/23/2022)  Alcohol Screen: Low Risk  (02/28/2021)  Depression (PHQ2-9): Low Risk  (06/21/2021)  Financial Resource Strain: Low Risk  (04/10/2019)  Physical Activity: Inactive (04/10/2019)  Social Connections: Moderately Integrated (04/10/2019)  Stress: No Stress Concern Present (04/10/2019)  Tobacco Use: Low Risk  (05/23/2022)    Readmission Risk Interventions     No data to display

## 2022-06-05 NOTE — Progress Notes (Signed)
.    Progress Note    Adam Merritt  GMW:[id number] DOB: November 13, 1945  DOA: 05/22/2022 PCP: [name], MD    Brief Narrative:    Medical records reviewed and are as summarized below:  Adam Merritt is a 77 y.o. 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  Assessment/Plan:   Principal Problem:   Syncope Active Problems:   Acute lower UTI   Dyslipidemia   Type 2 diabetes mellitus with peripheral neuropathy (HCC)   Fall   Weakness   Malnutrition of moderate degree   Influenza A   Pressure injury of skin  E. Faecalis and E. Coli UTI: Patient has history of multiple UTIs. - S/P 7 days of Nitrofurantoin 12/21-12/27.  Influenza A: - completed Day 5 of Oseltamivir on 12/31  Physical Deconditioning / Wheelchair Bound: - PT/OT recommends SNF with short term Rehab - placement is pending authorization  Diabetes Mellitus Type 2 with chronic insulin use: - Glucose is around 73 mmol/L.  Decrease Lantus from 30 to 15 units daily on 1/1, may need to titrate back up in the next 1-2 days.  Essential Hypertension:  - BP is stable.  History of CVA: - Aspirin and Statin.  CAD s/p 2013 CABG: - Aspirin, BB, Statin.  PVD: - Aspirin and Statin.  Hyperlipidemia: LDL 61, HDL 21, Triglycerides 183. - Statin.  Chronic Diastolic Heart Failure: - Continue BB and diuretics.  CKD3: - Creatinine is stable.  05/24/22  05/25/22  05/30/22  06/03/22   Creatinine 1.41 (H) 1.36 (H) 1.18 1.16   Morbid Obesity (BMI 48.09 kg/m2) / OSA: - Patient is prescribed CPAP machine but does not use it. - Patient was counseled on adherence.  Chronic Lymphedema / Venous Stasis Ulcers: - Change Unna boots  weekly.  Appreciate Wound Care.  Bilateral Upper Extremity Tremors: - Follows with Neurology outpatient.  GERD:  - PPI  MDD: - Sertraline 100 mg daily.  Insomnia: - Trazodone 75 mg nightly   Diet Order             Diet 2 gram sodium Fluid consistency: Thin  Diet effective now                  Consultants: None  Procedures: None   Medications:    ascorbic acid  500 mg Oral BID   aspirin EC  325 mg Oral Daily   atorvastatin  40 mg Oral QHS   carvedilol  25 mg Oral BID WC   vitamin D3  1,000 Units Oral Daily   cyanocobalamin  1,000 mcg Oral Daily   docusate  50 mg Oral BID   enoxaparin (LOVENOX) injection  0.5 mg/kg Subcutaneous Q24H   feeding supplement  237 mL Oral TID BM   folic acid  1 mg Oral Daily   gabapentin  300 mg Oral BID   insulin aspart  0-9 Units Subcutaneous TID WC   insulin glargine-yfgn  15 Units Subcutaneous Daily   iron polysaccharides  150 mg Oral Daily   multivitamin with minerals  1 tablet Oral Daily   pantoprazole  40 mg Oral Daily   pentoxifylline  400 mg Oral TID WC   prazosin  1 mg Oral QHS   sertraline  100 mg Oral Daily   traZODone  75 mg Oral QHS   zinc sulfate  220 mg Oral Daily   Continuous Infusions:  Anti-infectives (From admission, onward)    Start     Dose/Rate Route Frequency Ordered Stop   05/30/22 2200  oseltamivir (TAMIFLU) capsule 75 mg        75 mg Oral 2 times daily 05/30/22 1835 06/04/22 0949   05/25/22 1500  nitrofurantoin (macrocrystal-monohydrate) (MACROBID) capsule 100 mg        100 mg Oral Every 12 hours 05/25/22 1412 05/31/22 2238   05/24/22 0200  cefTRIAXone (ROCEPHIN) 2 g in sodium chloride 0.9 % 100 mL IVPB  Status:  Discontinued        2 g 200 mL/hr over 30 Minutes Intravenous Every 24 hours 05/23/22 0217 05/25/22 1412   05/23/22 0230  cefTRIAXone (ROCEPHIN) 1 g in sodium chloride 0.9 % 100 mL IVPB        1 g 200 mL/hr over 30 Minutes Intravenous  Once 05/23/22 0229 05/23/22 0448   05/23/22 0130   cefTRIAXone (ROCEPHIN) 1 g in sodium chloride 0.9 % 100 mL IVPB        1 g 200 mL/hr over 30 Minutes Intravenous  Once 05/23/22 0122 05/23/22 0214        Family Communication/Anticipated D/C date and plan/Code Status   DVT prophylaxis:      Code Status: Full Code  Family Communication: No family at bedside today. Disposition Plan: SNF at discharge   Status is: Inpatient Remains inpatient appropriate because: pending SNF placement.   Subjective:   Pt seen this PM, feels tired but has no specific complaints.  Objective:     Vitals:   06/04/22 1731 06/04/22 2017 06/05/22 0533 06/05/22 0847  BP: (!) 124/55 (!) 114/50 134/60 122/75  Pulse: 63 64 62 65  Resp: '17 20 20 20  '$ Temp: 98.2 F (36.8 C) 98.6 F (37 C) 98 F (36.7 C)   TempSrc:   Oral   SpO2: 97% 98% 100% 97%  Weight:      Height:         No data found.     Intake/Output Summary (Last 24 hours) at 06/05/2022 1422 Last data filed at 06/05/2022 1236 Gross per 24 hour  Intake --  Output 400 ml  Net -400 ml     Filed Weights   05/22/22 1913  Weight: 131.1 kg    Exam:   Physical Exam Constitutional:      Appearance: He is obese.     Comments: Chronically ill appearing  HENT:     Head: Normocephalic and atraumatic.     Mouth/Throat:     Mouth: Mucous membranes are moist.  Eyes:     Extraocular Movements: Extraocular movements intact.     Pupils: Pupils are equal, round, and reactive to light.  Cardiovascular:     Rate and Rhythm: Normal rate and regular rhythm.  Pulmonary:     Effort: Pulmonary effort is normal.     Breath sounds: Normal breath sounds.  Abdominal:     General: Bowel sounds are normal.     Palpations: Abdomen is soft.  Musculoskeletal:     Cervical back: Normal range of motion and neck supple.  Skin:    General: Skin is warm and dry.     Capillary Refill: Capillary refill takes less than 2 seconds.  Neurological:     General: No focal deficit  present.  Psychiatric:         Mood and Affect: Mood normal.       Pressure Injury 05/23/22 Buttocks Mid Stage 2 -  Partial thickness loss of dermis presenting as a shallow open injury with a red, pink wound bed without slough. (Active)  05/23/22 2000  Location: Buttocks  Location Orientation: Mid  Staging: Stage 2 -  Partial thickness loss of dermis presenting as a shallow open injury with a red, pink wound bed without slough.  Wound Description (Comments):   Present on Admission: Yes  Dressing Type Foam - Lift dressing to assess site every shift 06/05/22 0936     Data Reviewed:   I have personally reviewed following labs and imaging studies:  Labs: Labs show the following:   Basic Metabolic Panel: Recent Labs  Lab 05/30/22 0747 06/03/22 0637  NA 140 140  K 3.8 3.9  CL 105 106  CO2 27 30  GLUCOSE 71 73  BUN 22 18  CREATININE 1.18 1.16  CALCIUM 8.9 8.8*  MG 2.3  --   PHOS 3.5  --     GFR Estimated Creatinine Clearance: 67.4 mL/min (by C-G formula based on SCr of 1.16 mg/dL). Liver Function Tests: No results for input(s): "AST", "ALT", "ALKPHOS", "BILITOT", "PROT", "ALBUMIN" in the last 168 hours.  No results for input(s): "LIPASE", "AMYLASE" in the last 168 hours. No results for input(s): "AMMONIA" in the last 168 hours.  Coagulation profile No results for input(s): "INR", "PROTIME" in the last 168 hours.  CBC: Recent Labs  Lab 05/30/22 0747 06/03/22 0637  WBC 5.2 5.7  NEUTROABS 2.6 2.9  HGB 11.7* 11.4*  HCT 36.6* 36.1*  MCV 91.5 91.2  PLT 122* 134*    Cardiac Enzymes: No results for input(s): "CKTOTAL", "CKMB", "CKMBINDEX", "TROPONINI" in the last 168 hours. BNP (last 3 results) No results for input(s): "PROBNP" in the last 8760 hours. CBG: Recent Labs  Lab 06/04/22 1259 06/04/22 1729 06/04/22 2106 06/05/22 0845 06/05/22 1216  GLUCAP 123* 112* 161* 125* 187*    D-Dimer: No results for input(s): "DDIMER" in the last 72 hours. Hgb A1c: No results for input(s):  "HGBA1C" in the last 72 hours. Lipid Profile: No results for input(s): "CHOL", "HDL", "LDLCALC", "TRIG", "CHOLHDL", "LDLDIRECT" in the last 72 hours. Thyroid function studies: No results for input(s): "TSH", "T4TOTAL", "T3FREE", "THYROIDAB" in the last 72 hours.  Invalid input(s): "FREET3" Anemia work up: No results for input(s): "VITAMINB12", "FOLATE", "FERRITIN", "TIBC", "IRON", "RETICCTPCT" in the last 72 hours. Sepsis Labs: Recent Labs  Lab 05/30/22 0747 06/03/22 0637  WBC 5.2 5.7     Microbiology Recent Results (from the past 240 hour(s))  Resp panel by RT-PCR (RSV, Flu A&B, Covid) Anterior Nasal Swab     Status: Abnormal   Collection Time: 05/30/22  4:02 PM   Specimen: Anterior Nasal Swab  Result Value Ref Range Status   SARS Coronavirus 2 by RT PCR NEGATIVE NEGATIVE Final    Comment: (NOTE) SARS-CoV-2 target nucleic acids are NOT DETECTED.  The SARS-CoV-2 RNA is generally detectable in upper respiratory specimens during the acute phase of infection. The lowest concentration of SARS-CoV-2 viral copies this assay can detect is 138 copies/mL. A negative result does not preclude SARS-Cov-2 infection and should not be used as the sole basis for treatment or other patient management decisions. A negative result may occur with  improper specimen collection/handling, submission of specimen other than nasopharyngeal swab, presence of viral mutation(s) within the areas targeted by  this assay, and inadequate number of viral copies(<138 copies/mL). A negative result must be combined with clinical observations, patient history, and epidemiological information. The expected result is Negative.  Fact Sheet for Patients:  EntrepreneurPulse.com.au  Fact Sheet for Healthcare Providers:  IncredibleEmployment.be  This test is no t yet approved or cleared by the [place] FDA and  has been authorized for detection and/or diagnosis of SARS-CoV-2  by FDA under an Emergency Use Authorization (EUA). This EUA will remain  in effect (meaning this test can be used) for the duration of the COVID-19 declaration under Section 564(b)(1) of the Act, 21 U.S.C.section 360bbb-3(b)(1), unless the authorization is terminated  or revoked sooner.       Influenza A by PCR POSITIVE (A) NEGATIVE Final   Influenza B by PCR NEGATIVE NEGATIVE Final    Comment: (NOTE) The Xpert Xpress SARS-CoV-2/FLU/RSV plus assay is intended as an aid in the diagnosis of influenza from Nasopharyngeal swab specimens and should not be used as a sole basis for treatment. Nasal washings and aspirates are unacceptable for Xpert Xpress SARS-CoV-2/FLU/RSV testing.  Fact Sheet for Patients: EntrepreneurPulse.com.au  Fact Sheet for Healthcare Providers: IncredibleEmployment.be  This test is not yet approved or cleared by the [place] FDA and has been authorized for detection and/or diagnosis of SARS-CoV-2 by FDA under an Emergency Use Authorization (EUA). This EUA will remain in effect (meaning this test can be used) for the duration of the COVID-19 declaration under Section 564(b)(1) of the Act, 21 U.S.C. section 360bbb-3(b)(1), unless the authorization is terminated or revoked.     Resp Syncytial Virus by PCR NEGATIVE NEGATIVE Final    Comment: (NOTE) Fact Sheet for Patients: EntrepreneurPulse.com.au  Fact Sheet for Healthcare Providers: IncredibleEmployment.be  This test is not yet approved or cleared by the [place] FDA and has been authorized for detection and/or diagnosis of SARS-CoV-2 by FDA under an Emergency Use Authorization (EUA). This EUA will remain in effect (meaning this test can be used) for the duration of the COVID-19 declaration under Section 564(b)(1) of the Act, 21 U.S.C. section 360bbb-3(b)(1), unless the authorization is terminated or revoked.  Performed at  [facility], [address] [place], [place] [address]     Procedures and diagnostic studies:  No results found.   LOS: 12 days   Satya Buttram [employer]  [facility]   Pager on [email or web address]. If 7PM-7AM, please contact night-coverage at [email or web address]     06/05/2022, 2:22 PM

## 2022-06-06 DIAGNOSIS — J101 Influenza due to other identified influenza virus with other respiratory manifestations: Secondary | ICD-10-CM | POA: Diagnosis not present

## 2022-06-06 LAB — GLUCOSE, CAPILLARY
Glucose-Capillary: 104 mg/dL — ABNORMAL HIGH (ref 70–99)
Glucose-Capillary: 160 mg/dL — ABNORMAL HIGH (ref 70–99)
Glucose-Capillary: 149 mg/dL — ABNORMAL HIGH (ref 70–99)
Glucose-Capillary: 97 mg/dL (ref 70–99)

## 2022-06-06 MED ORDER — ENSURE ENLIVE PO LIQD: 237.0000 mL | Freq: Three times a day (TID) | ORAL | 12 refills | Status: DC

## 2022-06-06 MED ORDER — GABAPENTIN 300 MG PO CAPS: 300.0000 mg | ORAL_CAPSULE | Freq: Two times a day (BID) | ORAL | 0 refills | Status: AC

## 2022-06-06 MED ORDER — INSULIN GLARGINE-YFGN 100 UNIT/ML ~~LOC~~ SOLN: 15.0000 [IU] | Freq: Every day | SUBCUTANEOUS | 11 refills | Status: DC

## 2022-06-06 MED ORDER — TRAZODONE HCL 150 MG PO TABS: 75.0000 mg | ORAL_TABLET | Freq: Every day | ORAL | 0 refills | Status: AC

## 2022-06-06 NOTE — Plan of Care (Signed)
Problem: Education: Goal: Ability to describe self-care measures that may prevent or decrease complications (Diabetes Survival Skills Education) will improve 06/06/2022 1502 by Valaria Good, RN Outcome: Progressing 06/06/2022 1502 by Valaria Good, RN Outcome: Progressing Goal: Individualized Educational Video(s) 06/06/2022 1502 by Valaria Good, RN Outcome: Progressing 06/06/2022 1502 by Valaria Good, RN Outcome: Progressing   Problem: Coping: Goal: Ability to adjust to condition or change in health will improve 06/06/2022 1502 by Valaria Good, RN Outcome: Progressing 06/06/2022 1502 by Valaria Good, RN Outcome: Progressing   Problem: Fluid Volume: Goal: Ability to maintain a balanced intake and output will improve 06/06/2022 1502 by Valaria Good, RN Outcome: Progressing 06/06/2022 1502 by Valaria Good, RN Outcome: Progressing   Problem: Health Behavior/Discharge Planning: Goal: Ability to identify and utilize available resources and services will improve 06/06/2022 1502 by Valaria Good, RN Outcome: Progressing 06/06/2022 1502 by Valaria Good, RN Outcome: Progressing Goal: Ability to manage health-related needs will improve 06/06/2022 1502 by Valaria Good, RN Outcome: Progressing 06/06/2022 1502 by Valaria Good, RN Outcome: Progressing   Problem: Metabolic: Goal: Ability to maintain appropriate glucose levels will improve 06/06/2022 1502 by Valaria Good, RN Outcome: Progressing 06/06/2022 1502 by Valaria Good, RN Outcome: Progressing   Problem: Nutritional: Goal: Maintenance of adequate nutrition will improve 06/06/2022 1502 by Valaria Good, RN Outcome: Progressing 06/06/2022 1502 by Valaria Good, RN Outcome: Progressing Goal: Progress toward achieving an optimal weight will improve 06/06/2022 1502 by Valaria Good, RN Outcome: Progressing 06/06/2022 1502 by Valaria Good,  RN Outcome: Progressing   Problem: Skin Integrity: Goal: Risk for impaired skin integrity will decrease 06/06/2022 1502 by Valaria Good, RN Outcome: Progressing 06/06/2022 1502 by Valaria Good, RN Outcome: Progressing   Problem: Tissue Perfusion: Goal: Adequacy of tissue perfusion will improve 06/06/2022 1502 by Valaria Good, RN Outcome: Progressing 06/06/2022 1502 by Valaria Good, RN Outcome: Progressing   Problem: Education: Goal: Knowledge of General Education information will improve Description: Including pain rating scale, medication(s)/side effects and non-pharmacologic comfort measures 06/06/2022 1502 by Valaria Good, RN Outcome: Progressing 06/06/2022 1502 by Valaria Good, RN Outcome: Progressing   Problem: Health Behavior/Discharge Planning: Goal: Ability to manage health-related needs will improve 06/06/2022 1502 by Valaria Good, RN Outcome: Progressing 06/06/2022 1502 by Valaria Good, RN Outcome: Progressing   Problem: Clinical Measurements: Goal: Ability to maintain clinical measurements within normal limits will improve 06/06/2022 1502 by Valaria Good, RN Outcome: Progressing 06/06/2022 1502 by Valaria Good, RN Outcome: Progressing Goal: Will remain free from infection 06/06/2022 1502 by Valaria Good, RN Outcome: Progressing 06/06/2022 1502 by Valaria Good, RN Outcome: Progressing Goal: Diagnostic test results will improve 06/06/2022 1502 by Valaria Good, RN Outcome: Progressing 06/06/2022 1502 by Valaria Good, RN Outcome: Progressing Goal: Respiratory complications will improve 06/06/2022 1502 by Valaria Good, RN Outcome: Progressing 06/06/2022 1502 by Valaria Good, RN Outcome: Progressing Goal: Cardiovascular complication will be avoided 06/06/2022 1502 by Valaria Good, RN Outcome: Progressing 06/06/2022 1502 by Valaria Good, RN Outcome: Progressing    Problem: Activity: Goal: Risk for activity intolerance will decrease 06/06/2022 1502 by Valaria Good, RN Outcome: Progressing 06/06/2022 1502 by Valaria Good, RN Outcome: Progressing   Problem: Nutrition: Goal: Adequate nutrition will be maintained 06/06/2022 1502 by Valaria Good, RN Outcome: Progressing 06/06/2022 1502 by Valaria Good, RN Outcome: Progressing  Problem: Coping: Goal: Level of anxiety will decrease 06/06/2022 1502 by Valaria Good, RN Outcome: Progressing 06/06/2022 1502 by Valaria Good, RN Outcome: Progressing   Problem: Elimination: Goal: Will not experience complications related to bowel motility 06/06/2022 1502 by Valaria Good, RN Outcome: Progressing 06/06/2022 1502 by Valaria Good, RN Outcome: Progressing Goal: Will not experience complications related to urinary retention 06/06/2022 1502 by Valaria Good, RN Outcome: Progressing 06/06/2022 1502 by Valaria Good, RN Outcome: Progressing   Problem: Pain Managment: Goal: General experience of comfort will improve 06/06/2022 1502 by Valaria Good, RN Outcome: Progressing 06/06/2022 1502 by Valaria Good, RN Outcome: Progressing   Problem: Safety: Goal: Ability to remain free from injury will improve 06/06/2022 1502 by Valaria Good, RN Outcome: Progressing 06/06/2022 1502 by Valaria Good, RN Outcome: Progressing   Problem: Skin Integrity: Goal: Risk for impaired skin integrity will decrease 06/06/2022 1502 by Valaria Good, RN Outcome: Progressing 06/06/2022 1502 by Valaria Good, RN Outcome: Progressing

## 2022-06-06 NOTE — Progress Notes (Signed)
Insurance denied request for SNF.  Pt to go home with [facility] OT/PT/RN.  [facility] will provide [facility] services.  Family is requesting a hospital bed and 3-in-1 for home,

## 2022-06-06 NOTE — Progress Notes (Addendum)
.    Progress Note    Adam Merritt  YYT:[id number] DOB: 1945/07/01  DOA: 05/22/2022 PCP: [name], MD    Brief Narrative:    Medical records reviewed and are as summarized below:  Adam Merritt is a 77 y.o. 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  Assessment/Plan:   Principal Problem:   Syncope Active Problems:   Acute lower UTI   Dyslipidemia   Type 2 diabetes mellitus with peripheral neuropathy (HCC)   Fall   Weakness   Malnutrition of moderate degree   Influenza A   Pressure injury of skin  E. Faecalis and E. Coli UTI: Patient has history of multiple UTIs. - S/P 7 days of Nitrofurantoin 12/21-12/27.  Influenza A: - completed Day 5 of Oseltamivir on 12/31  Physical Deconditioning / Wheelchair Bound: - PT/OT recommends SNF with short term Rehab - placement is pending authorization  Diabetes Mellitus Type 2 with chronic insulin use: - Glucose is around 100 mmol/L now.  Decreased Lantus from 30 to 15 units daily on 1/1, will keep at current dose.  Essential Hypertension:  - BP is stable.  History of CVA: - Aspirin and Statin.  CAD s/p 2013 CABG: - Aspirin, BB, Statin.  PVD: - Aspirin and Statin.  Hyperlipidemia: LDL 61, HDL 21, Triglycerides 183. - Statin.  Chronic Diastolic Heart Failure: - Continue BB and diuretics.  CKD3: - Creatinine is stable.  05/24/22  05/25/22  05/30/22  06/03/22   Creatinine 1.41 (H) 1.36 (H) 1.18 1.16   Morbid Obesity (BMI 48.09 kg/m2) / OSA: - Patient is prescribed CPAP machine but does not use it. - Patient was counseled on adherence.  Chronic Lymphedema / Venous Stasis Ulcers: - Change Unna boots weekly.   Appreciate Wound Care.  Bilateral Upper Extremity Tremors: - Follows with Neurology outpatient.  GERD:  - PPI  MDD: - Sertraline 100 mg daily.  Insomnia: - Trazodone 75 mg nightly   Diet Order             Diet 2 gram sodium Fluid consistency: Thin  Diet effective now                  Consultants: None  Procedures: None   Medications:    ascorbic acid  500 mg Oral BID   aspirin EC  325 mg Oral Daily   atorvastatin  40 mg Oral QHS   carvedilol  25 mg Oral BID WC   vitamin D3  1,000 Units Oral Daily   cyanocobalamin  1,000 mcg Oral Daily   docusate  50 mg Oral BID   enoxaparin (LOVENOX) injection  0.5 mg/kg Subcutaneous Q24H   feeding supplement  237 mL Oral TID BM   folic acid  1 mg Oral Daily   gabapentin  300 mg Oral BID   insulin aspart  0-9 Units Subcutaneous TID WC   insulin glargine-yfgn  15 Units Subcutaneous Daily   iron polysaccharides  150 mg Oral Daily   multivitamin with minerals  1 tablet Oral Daily   pantoprazole  40 mg Oral Daily   pentoxifylline  400 mg Oral TID WC   prazosin  1 mg Oral QHS  sertraline  100 mg Oral Daily   traZODone  75 mg Oral QHS   Continuous Infusions:  Anti-infectives (From admission, onward)    Start     Dose/Rate Route Frequency Ordered Stop   05/30/22 2200  oseltamivir (TAMIFLU) capsule 75 mg        75 mg Oral 2 times daily 05/30/22 1835 06/04/22 0949   05/25/22 1500  nitrofurantoin (macrocrystal-monohydrate) (MACROBID) capsule 100 mg        100 mg Oral Every 12 hours 05/25/22 1412 05/31/22 2238   05/24/22 0200  cefTRIAXone (ROCEPHIN) 2 g in sodium chloride 0.9 % 100 mL IVPB  Status:  Discontinued        2 g 200 mL/hr over 30 Minutes Intravenous Every 24 hours 05/23/22 0217 05/25/22 1412   05/23/22 0230  cefTRIAXone (ROCEPHIN) 1 g in sodium chloride 0.9 % 100 mL IVPB        1 g 200 mL/hr over 30 Minutes Intravenous  Once 05/23/22 0229 05/23/22 0448   05/23/22 0130  cefTRIAXone (ROCEPHIN) 1 g in sodium  chloride 0.9 % 100 mL IVPB        1 g 200 mL/hr over 30 Minutes Intravenous  Once 05/23/22 0122 05/23/22 0214        Family Communication/Anticipated D/C date and plan/Code Status   DVT prophylaxis:      Code Status: Full Code  Family Communication: No family at bedside today. Disposition Plan: SNF at discharge   Status is: Inpatient Remains inpatient appropriate because: pending SNF placement.   Subjective:   Pt seen this PM with RN, doing well no complaints.  Objective:     Vitals:   06/05/22 1601 06/05/22 2204 06/06/22 0545 06/06/22 0759  BP: (!) 140/88 (!) 116/59 (!) 142/56 (!) 143/59  Pulse: 71 60 (!) 52 (!) 54  Resp: '19 18 18 19  '$ Temp: 98.2 F (36.8 C) 97.6 F (36.4 C) (!) 97.4 F (36.3 C) 97.6 F (36.4 C)  TempSrc:  Oral Oral   SpO2: 98% 99% 100% 99%  Weight:      Height:         No data found.     Intake/Output Summary (Last 24 hours) at 06/06/2022 1002 Last data filed at 06/05/2022 1734 Gross per 24 hour  Intake --  Output 1500 ml  Net -1500 ml     Filed Weights   05/22/22 1913  Weight: 131.1 kg    Exam:   Physical Exam Constitutional:      Appearance: He is obese.     Comments: Chronically ill appearing  HENT:     Head: Normocephalic and atraumatic.     Mouth/Throat:     Mouth: Mucous membranes are moist.  Eyes:     Extraocular Movements: Extraocular movements intact.     Pupils: Pupils are equal, round, and reactive to light.  Cardiovascular:     Rate and Rhythm: Normal rate and regular rhythm.  Pulmonary:     Effort: Pulmonary effort is normal.     Breath sounds: Normal breath sounds.  Abdominal:     General: Bowel sounds are normal.     Palpations: Abdomen is soft.  Musculoskeletal:     Cervical back: Normal range of motion and neck supple.  Skin:    General: Skin is warm and dry.     Capillary Refill: Capillary refill takes less than 2 seconds.  Neurological:     General: No focal deficit present.  Psychiatric:  Mood and Affect: Mood normal.       Pressure Injury 05/23/22 Buttocks Mid Stage 2 -  Partial thickness loss of dermis presenting as a shallow open injury with a red, pink wound bed without slough. (Active)  05/23/22 2000  Location: Buttocks  Location Orientation: Mid  Staging: Stage 2 -  Partial thickness loss of dermis presenting as a shallow open injury with a red, pink wound bed without slough.  Wound Description (Comments):   Present on Admission: Yes  Dressing Type Foam - Lift dressing to assess site every shift 06/05/22 2000     Data Reviewed:   I have personally reviewed following labs and imaging studies:  Labs: Labs show the following:   Basic Metabolic Panel: Recent Labs  Lab 06/03/22 0637  NA 140  K 3.9  CL 106  CO2 30  GLUCOSE 73  BUN 18  CREATININE 1.16  CALCIUM 8.8*    GFR Estimated Creatinine Clearance: 67.4 mL/min (by C-G formula based on SCr of 1.16 mg/dL). Liver Function Tests: No results for input(s): "AST", "ALT", "ALKPHOS", "BILITOT", "PROT", "ALBUMIN" in the last 168 hours.  No results for input(s): "LIPASE", "AMYLASE" in the last 168 hours. No results for input(s): "AMMONIA" in the last 168 hours.  Coagulation profile No results for input(s): "INR", "PROTIME" in the last 168 hours.  CBC: Recent Labs  Lab 06/03/22 0637  WBC 5.7  NEUTROABS 2.9  HGB 11.4*  HCT 36.1*  MCV 91.2  PLT 134*    Cardiac Enzymes: No results for input(s): "CKTOTAL", "CKMB", "CKMBINDEX", "TROPONINI" in the last 168 hours. BNP (last 3 results) No results for input(s): "PROBNP" in the last 8760 hours. CBG: Recent Labs  Lab 06/05/22 1216 06/05/22 1730 06/05/22 1851 06/05/22 2205 06/06/22 0756  GLUCAP 187* 138* 147* 122* 104*    D-Dimer: No results for input(s): "DDIMER" in the last 72 hours. Hgb A1c: No results for input(s): "HGBA1C" in the last 72 hours. Lipid Profile: No results for input(s): "CHOL", "HDL", "LDLCALC", "TRIG", "CHOLHDL",  "LDLDIRECT" in the last 72 hours. Thyroid function studies: No results for input(s): "TSH", "T4TOTAL", "T3FREE", "THYROIDAB" in the last 72 hours.  Invalid input(s): "FREET3" Anemia work up: No results for input(s): "VITAMINB12", "FOLATE", "FERRITIN", "TIBC", "IRON", "RETICCTPCT" in the last 72 hours. Sepsis Labs: Recent Labs  Lab 06/03/22 0637  WBC 5.7     Microbiology Recent Results (from the past 240 hour(s))  Resp panel by RT-PCR (RSV, Flu A&B, Covid) Anterior Nasal Swab     Status: Abnormal   Collection Time: 05/30/22  4:02 PM   Specimen: Anterior Nasal Swab  Result Value Ref Range Status   SARS Coronavirus 2 by RT PCR NEGATIVE NEGATIVE Final    Comment: (NOTE) SARS-CoV-2 target nucleic acids are NOT DETECTED.  The SARS-CoV-2 RNA is generally detectable in upper respiratory specimens during the acute phase of infection. The lowest concentration of SARS-CoV-2 viral copies this assay can detect is 138 copies/mL. A negative result does not preclude SARS-Cov-2 infection and should not be used as the sole basis for treatment or other patient management decisions. A negative result may occur with  improper specimen collection/handling, submission of specimen other than nasopharyngeal swab, presence of viral mutation(s) within the areas targeted by this assay, and inadequate number of viral copies(<138 copies/mL). A negative result must be combined with clinical observations, patient history, and epidemiological information. The expected result is Negative.  Fact Sheet for Patients:  EntrepreneurPulse.com.au  Fact Sheet for Healthcare Providers:  IncredibleEmployment.be  This test is no t yet approved or cleared by the [place] and  has been authorized for detection and/or diagnosis of SARS-CoV-2 by FDA under an Emergency Use Authorization (EUA). This EUA will remain  in effect (meaning this test can be used) for the duration of  the COVID-19 declaration under Section 564(b)(1) of the Act, 21 U.S.C.section 360bbb-3(b)(1), unless the authorization is terminated  or revoked sooner.       Influenza A by PCR POSITIVE (A) NEGATIVE Final   Influenza B by PCR NEGATIVE NEGATIVE Final    Comment: (NOTE) The Xpert Xpress SARS-CoV-2/FLU/RSV plus assay is intended as an aid in the diagnosis of influenza from Nasopharyngeal swab specimens and should not be used as a sole basis for treatment. Nasal washings and aspirates are unacceptable for Xpert Xpress SARS-CoV-2/FLU/RSV testing.  Fact Sheet for Patients: EntrepreneurPulse.com.au  Fact Sheet for Healthcare Providers: IncredibleEmployment.be  This test is not yet approved or cleared by the [place] FDA and has been authorized for detection and/or diagnosis of SARS-CoV-2 by FDA under an Emergency Use Authorization (EUA). This EUA will remain in effect (meaning this test can be used) for the duration of the COVID-19 declaration under Section 564(b)(1) of the Act, 21 U.S.C. section 360bbb-3(b)(1), unless the authorization is terminated or revoked.     Resp Syncytial Virus by PCR NEGATIVE NEGATIVE Final    Comment: (NOTE) Fact Sheet for Patients: EntrepreneurPulse.com.au  Fact Sheet for Healthcare Providers: IncredibleEmployment.be  This test is not yet approved or cleared by the [place] FDA and has been authorized for detection and/or diagnosis of SARS-CoV-2 by FDA under an Emergency Use Authorization (EUA). This EUA will remain in effect (meaning this test can be used) for the duration of the COVID-19 declaration under Section 564(b)(1) of the Act, 21 U.S.C. section 360bbb-3(b)(1), unless the authorization is terminated or revoked.  Performed at [facility], [address] [place], [place] [address]     Procedures and diagnostic studies:  No results found.    LOS: 13 days   Matisse Roskelley [employer]  [facility]   Pager on [email or web address]. If 7PM-7AM, please contact night-coverage at [email or web address]     06/06/2022, 10:02 AM

## 2022-06-06 NOTE — Progress Notes (Signed)
Physical Therapy Treatment Patient Details Name: Adam Merritt MRN: [id number] DOB: Apr 05, 1946 Today's Date: 06/06/2022   History of Present Illness Pt is a 66yoM who comes to [facility] 12/18 after a fall. PMH includes: DM2, CKD 3a, CAD, HTN, lymphedema, diabetic neuropathy, GERD, venous insufficiency with BLE ulceratons. 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 He has more recent BUE tremor in past few months. MD assessment includes: acute UTI, influenza, syncope, generalized weakness, and hypokalemia.    PT Comments    Pt received upright in bed agreeable to PT services. Resting on 2L/min. Pt re-assessed due to length of stay. Pt making slow progress demonstrating ability to perform bed mobility maxa+1-2 and x2 STS efforts maxA+2 with bed elevated and BRW. Pt beginning to initiate consistent gluteal clearance at EOB but remains significantly limited in standing tolerance and unable to safely transfer at this time with poor hip/knee extension with very laborious UE use on RW. Pt only tolerating ~5-6 sec standing bouts each STS. Pt will continue to benefit from STR placement due to deconditioning and inability to transfer which is pt's baseline. Pt returned to upright in bed maxA+2 with all needs in reach.    Recommendations for follow up therapy are one component of a multi-disciplinary discharge planning process, led by the attending physician.  Recommendations may be updated based on patient status, additional functional criteria and insurance authorization.  Follow Up Recommendations  Skilled nursing-short term rehab (<3 hours/day) Can patient physically be transported by private vehicle: No   Assistance Recommended at Discharge Frequent or constant Supervision/Assistance  Patient can return home with the following Two people to help with walking and/or transfers;Two people to help with bathing/dressing/bathroom;Assistance with cooking/housework;Assistance with feeding;Direct supervision/assist for  medications management;Assist for transportation;Help with stairs or ramp for entrance   Equipment Recommendations  Other (comment) (TBD)    Recommendations for Other Services       Precautions / Restrictions Precautions Precautions: Fall Restrictions Weight Bearing Restrictions: No     Mobility  Bed Mobility Overal bed mobility: Needs Assistance Bed Mobility: Supine to Sit, Sit to Supine     Supine to sit: Max assist, HOB elevated, +2 for physical assistance Sit to supine: Max assist, +2 for physical assistance     Patient Response: Cooperative  Transfers Overall transfer level: Needs assistance Equipment used: Rolling walker (2 wheels) Transfers: Sit to/from Stand Sit to Stand: Max assist, +2 physical assistance, From elevated surface           General transfer comment: buttocks beginning to clear surface of bed. Only needing maxA+2 compared to total.    Ambulation/Gait               General Gait Details: Unable   Stairs             Wheelchair Mobility    Modified Rankin (Stroke Patients Only)       Balance Overall balance assessment: Needs assistance Sitting-balance support: Feet supported, Bilateral upper extremity supported Sitting balance-Leahy Scale: Fair Sitting balance - Comments: once seated with UE/LE placement, able to sit statically with close supervision.                                    Cognition Arousal/Alertness: Awake/alert Behavior During Therapy: WFL for tasks assessed/performed Overall Cognitive Status: Within Functional Limits for tasks assessed  General Comments: 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        Exercises      General Comments        Pertinent Vitals/Pain Pain Assessment Pain Assessment: No/denies pain Pain Location: b/l LE Pain Descriptors / Indicators: Aching, Sore Pain Intervention(s): Limited activity within patient's  tolerance, Monitored during session, Repositioned    Home Living                          Prior Function            PT Goals (current goals can now be found in the care plan section) Acute Rehab PT Goals Patient Stated Goal: find a safe way to return to home Time For Goal Achievement: 06/20/22 Potential to Achieve Goals: Fair Progress towards PT goals: Progressing toward goals    Frequency    Min 2X/week      PT Plan Current plan remains appropriate    Co-evaluation              AM-PAC PT "6 Clicks" Mobility   Outcome Measure  Help needed turning from your back to your side while in a flat bed without using bedrails?: A Lot Help needed moving from lying on your back to sitting on the side of a flat bed without using bedrails?: Total Help needed moving to and from a bed to a chair (including a wheelchair)?: Total Help needed standing up from a chair using your arms (e.g., wheelchair or bedside chair)?: Total Help needed to walk in hospital room?: Total Help needed climbing 3-5 steps with a railing? : Total 6 Click Score: 7    End of Session Equipment Utilized During Treatment: Gait belt;Oxygen Activity Tolerance: Patient tolerated treatment well Patient left: in bed;with call bell/phone within reach;with bed alarm set Nurse Communication: Mobility status PT Visit Diagnosis: Other abnormalities of gait and mobility (R26.89);Muscle weakness (generalized) (M62.81)     Time: 8295-6213 PT Time Calculation (min) (ACUTE ONLY): 19 min  Charges:  $Therapeutic Activity: 8-22 mins                    Salem Caster. Fairly IV, PT, DPT Physical Therapist- [facility]  06/06/2022, 3:46 PM

## 2022-06-06 NOTE — Progress Notes (Signed)
PT Cancellation Note  Patient Details Name: PRENTICE SACKRIDER MRN: [id number] DOB: 1946-04-24   Cancelled Treatment:     Pt's chart reviewed prior to entry. Upon entry pt receiving bath from NT. PT to re-attempt at a later time/date as able.    Salem Caster. Fairly IV, PT, DPT Physical Therapist- [facility]  06/06/2022, 2:46 PM

## 2022-06-06 NOTE — Progress Notes (Signed)
Patient is not able to walk the distance required to go the bathroom, or he/she is unable to safely negotiate stairs required to access the bathroom.  A 3in1 BSC will alleviate this problem  

## 2022-06-07 DIAGNOSIS — N390 Urinary tract infection, site not specified: Secondary | ICD-10-CM | POA: Diagnosis not present

## 2022-06-07 LAB — GLUCOSE, CAPILLARY
Glucose-Capillary: 126 mg/dL — ABNORMAL HIGH (ref 70–99)
Glucose-Capillary: 138 mg/dL — ABNORMAL HIGH (ref 70–99)
Glucose-Capillary: 111 mg/dL — ABNORMAL HIGH (ref 70–99)
Glucose-Capillary: 117 mg/dL — ABNORMAL HIGH (ref 70–99)

## 2022-06-07 NOTE — Progress Notes (Signed)
Nutrition Follow-up  DOCUMENTATION CODES:   Morbid obesity, Non-severe (moderate) malnutrition in context of chronic illness  INTERVENTION:   -Continue Ensure Enlive po TID, each supplement provides 350 kcal and 20 grams of protein.  -Continue Magic cup TID with meals, each supplement provides 290 kcal and 9 grams of protein  -Continue MVI with minerals daily -Continue 500 mg vitamin C BID -Continue 220 mg zinc sulfate daily x 14 days -Continue feeding assistance with meals -Continue 2 gram sodium for wider variety of meal selections  NUTRITION DIAGNOSIS:   Moderate Malnutrition related to chronic illness (CAD, lymphadema) as evidenced by mild fat depletion, mild muscle depletion.  Ongoing  GOAL:   Patient will meet greater than or equal to 90% of their needs  Progressing  MONITOR:   Supplement acceptance, PO intake  REASON FOR ASSESSMENT:   Malnutrition Screening Tool    ASSESSMENT:   Pt with medical history significant for  diabetes mellitus with complications of stage IIIa chronic kidney disease, coronary artery disease, hypertension, chronic lymphedema, diabetic neuropathy, GERD, history of venous insufficiency with bilateral lower extremity ulcerations who presents for evaluation of generalized weakness with subsequent syncope and fall without head injury.  Reviewed I/O's: -580 ml x 24 hours and -9.3 L since 05/24/22  UOP: 700 ml x 24 hours   Pt remains on a 2 gram sodium diet. No meal completion data available to assess. Pt is drinking supplements.   Per TOC notes, insurance denied SNF; family plans to appeal.   Medications reviewed and include vitamin C, vitamin D3, vitamin B-12, colace, lovenox, and folic acid.  Lab Results  Component Value Date   HGBA1C 6.8 (H) 04/28/2022   PTA DM medications are .   Labs reviewed: CBGS: 97-138 (inpatient orders for glycemic control are 0-9 units insulin aspart TID with meals and 15 units insulin glargine-yfgn daily).      Diet Order:   Diet Order             Diet - low sodium heart healthy           Diet 2 gram sodium Fluid consistency: Thin  Diet effective now                   EDUCATION NEEDS:   Education needs have been addressed  Skin:  Skin Assessment: Skin Integrity Issues: Skin Integrity Issues:: Stage II Stage II: buttocks  Last BM:  06/06/22  Height:   Ht Readings from Last 1 Encounters:  05/22/22 '5\' 5"'$  (1.651 m)    Weight:   Wt Readings from Last 1 Encounters:  05/22/22 131.1 kg    Ideal Body Weight:  61.8 kg  BMI:  Body mass index is 48.09 kg/m.  Estimated Nutritional Needs:   Kcal:  1950-2150  Protein:  105-120 grams  Fluid:  > 1.9 L    Loistine Chance, RD, LDN, [place] Registered Dietitian II Certified Diabetes Care and Education Specialist Please refer to [facility] for RD and/or RD on-call/weekend/after hours pager

## 2022-06-07 NOTE — Discharge Summary (Signed)
Physician Discharge Summary   Patient: Adam Merritt MRN: [id number] DOB: 1946/02/16  Admit date:     05/22/2022  Discharge date: 06/07/22  Discharge Physician: [place]   PCP: [name], MD   Recommendations at discharge:    Follow up with PCP in 2 weeks.  Discharge Diagnoses: Principal Problem:   Syncope Active Problems:   Acute lower UTI   Dyslipidemia   Type 2 diabetes mellitus with peripheral neuropathy (HCC)   Fall   Weakness   Malnutrition of moderate degree   Influenza A   Pressure injury of skin  Resolved Problems:   * No resolved hospital problems. *  Hospital Course: Adam Merritt is a 77 y.o. 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  He as admitted to the hospital, seen by PT who recommends SNF. He as treated for FLU with Tamiflu completed a course on 12/31. He was also treated for UTI, completed course of Nitrofurantoin for E. Faecalis and E Coli UTI on 12/27. Due to hypoglycemia, home Lantus dose was decreased. He has since been stable and doing well, he wears nocturnal O2 at home. Unfortunately [employer] has denied Rehab so patient will go home today with [facility] once DME is delivered. Plan of care was discussed with the patient and his wife over the phone. All questions answered and they are agreeable to DC home today.       Disposition: Home health Diet recommendation:  Discharge Diet Orders (From admission, onward)     Start     Ordered   06/07/22 0000  Diet - low sodium heart healthy        06/07/22 0934           Carb modified diet DISCHARGE MEDICATION: Allergies as of 06/07/2022   No Known Allergies      Medication List     STOP  taking these medications    albuterol 108 (90 Base) MCG/ACT inhaler Commonly known as: VENTOLIN HFA   Basaglar KwikPen 100 UNIT/ML Replaced by: insulin glargine-yfgn 100 UNIT/ML injection   furosemide 40 MG tablet Commonly known as: LASIX   mometasone 0.1 % lotion Commonly known as: ELOCON   spironolactone 25 MG tablet Commonly known as: ALDACTONE   triamcinolone cream 0.1 % Commonly known as: KENALOG   Victoza 18 MG/3ML Sopn Generic drug: liraglutide       TAKE these medications    ascorbic acid 500 MG tablet Commonly known as: VITAMIN C Take 1 tablet (500 mg total) by mouth daily.   aspirin EC 325 MG tablet Take 325 mg by mouth daily.   atorvastatin 40 MG tablet Commonly known as: LIPITOR Take 40 mg by mouth at bedtime.   BD Pen Needle Nano U/F 32G X 4 MM Misc Generic drug: Insulin Pen Needle Use as directed with insulin DX e11.65   carvedilol 25 MG tablet Commonly known as: COREG Take 25 mg by mouth 2 (two) times daily with a meal.   cyanocobalamin 1000 MCG tablet Take 1 tablet (1,000 mcg total) by mouth daily.   docusate sodium 50 MG capsule Commonly known as: COLACE Take 50 mg by mouth 2 (two) times daily.  feeding supplement Liqd Take 237 mLs by mouth 3 (three) times daily between meals.   folic acid 1 MG tablet Commonly known as: FOLVITE Take 1 tablet (1 mg total) by mouth daily.   FreeStyle Libre 2 Sensor Misc 1 Device by Does not apply route every 14 (fourteen) days.   gabapentin 300 MG capsule Commonly known as: NEURONTIN Take 1 capsule (300 mg total) by mouth 2 (two) times daily. What changed:  how much to take how to take this when to take this additional instructions   glucose blood test strip Use as instructed to check blood sugars twice daily   insulin glargine-yfgn 100 UNIT/ML injection Commonly known as: SEMGLEE Inject 0.15 mLs (15 Units total) into the skin daily. Replaces: Basaglar KwikPen 100 UNIT/ML    ipratropium-albuterol 0.5-2.5 (3) MG/3ML Soln Commonly known as: DUONEB Take 3 mLs by nebulization every 4 (four) hours as needed (for shortness of breath).   iron polysaccharides 150 MG capsule Commonly known as: NIFEREX Take 1 capsule (150 mg total) by mouth daily.   multivitamin with minerals Tabs tablet Take 1 tablet by mouth daily.   omeprazole 20 MG capsule Commonly known as: PRILOSEC Take 20 mg by mouth daily.   OneTouch Delica Lancets 01U Misc Use twice daily diag e11.65   pentoxifylline 400 MG CR tablet Commonly known as: TRENTAL TAKE 1 TABLET BY MOUTH THREE TIMES DAILY FOR CLAUDICATION   polyethylene glycol 17 g packet Commonly known as: MIRALAX / GLYCOLAX Take 17 g by mouth daily as needed for mild constipation or moderate constipation.   prazosin 1 MG capsule Commonly known as: MINIPRESS TAKE THREE CAPSULES BY MOUTH EVERY EVENING FOR NIGHTMARES   sennosides-docusate sodium 8.6-50 MG tablet Commonly known as: SENOKOT-S Take 1-2 tablets by mouth 2 (two) times daily.   sertraline 100 MG tablet Commonly known as: ZOLOFT Take 100 mg by mouth daily.   traZODone 150 MG tablet Commonly known as: DESYREL Take 0.5 tablets (75 mg total) by mouth at bedtime.   vitamin D3 25 MCG tablet Commonly known as: CHOLECALCIFEROL Take 1,000 Units by mouth daily.               Durable Medical Equipment  (From admission, onward)           Start     Ordered   06/06/22 0000  For home use only DME Hospital bed       Question Answer Comment  Length of Need Lifetime   Bed type Semi-electric      06/06/22 1551   06/06/22 0000  DME Bedside commode       Question:  Patient needs a bedside commode to treat with the following condition  Answer:  Weakness generalized   06/06/22 1551              Discharge Care Instructions  (From admission, onward)           Start     Ordered   06/07/22 0000  Discharge wound care:       Comments: Pressure Injury  05/23/22 Buttocks Mid Stage 2 -  Partial thickness loss of dermis presenting as a shallow open injury with a red, pink wound bed without slough   06/07/22 0934           Discharge Exam: Filed Weights   05/22/22 1913  Weight: 131.1 kg  Constitutional:      Appearance: He is obese.     Comments: Chronically ill appearing  HENT:  Head: Normocephalic and atraumatic.     Mouth/Throat:     Mouth: Mucous membranes are moist.  Eyes:     Extraocular Movements: Extraocular movements intact.     Pupils: Pupils are equal, round, and reactive to light.  Cardiovascular:     Rate and Rhythm: Normal rate and regular rhythm.  Pulmonary:     Effort: Pulmonary effort is normal.     Breath sounds: Normal breath sounds.  Abdominal:     General: Bowel sounds are normal.     Palpations: Abdomen is soft.  Musculoskeletal:     Cervical back: Normal range of motion and neck supple.  Skin:    General: Skin is warm and dry.     Capillary Refill: Capillary refill takes less than 2 seconds.  Neurological:     General: No focal deficit present.  Psychiatric:        Mood and Affect: Mood normal.   Condition at discharge: stable  The results of significant diagnostics from this hospitalization (including imaging, microbiology, ancillary and laboratory) are listed below for reference.   Imaging Studies: US RENAL  Result Date: 05/25/2022 CLINICAL DATA:  Urinary tract infection, history CHF, diabetes mellitus, hypertension EXAM: RENAL / URINARY TRACT ULTRASOUND COMPLETE COMPARISON:  04/17/2018 FINDINGS: Right Kidney: Renal measurements: 9.8 x 5.4 x 4.7 cm = volume: 131 mL. Cortical thinning. Normal cortical echogenicity. Slightly lobulated contour. No mass or hydronephrosis. No shadowing calculi. Left Kidney: Renal measurements: 11.4 x 4.7 x 6.2 cm = volume: 208 ML. Cortical thinning. Normal cortical echogenicity. No mass, hydronephrosis, or shadowing calcification. Bladder: Appears normal for degree  of bladder distention. Other: Exam quality degraded secondary to body habitus. IMPRESSION: No evidence of renal mass or hydronephrosis. Electronically Signed   By: Lavonia Dana M.D.   On: 05/25/2022 10:25   DG Chest 1 View  Result Date: 05/22/2022 CLINICAL DATA:  Fall, weakness EXAM: CHEST  1 VIEW COMPARISON:  04/27/2022 FINDINGS: Lungs are clear.  No pleural effusion or pneumothorax. The heart is normal in size. Postsurgical changes related to prior CABG. Median sternotomy. IMPRESSION: No evidence of acute cardiopulmonary disease. Electronically Signed   By: Julian Hy M.D.   On: 05/22/2022 20:00   DG Knee Complete 4 Views Right  Result Date: 05/22/2022 CLINICAL DATA:  Fall, right knee pain EXAM: RIGHT KNEE - COMPLETE 4+ VIEW COMPARISON:  04/29/2022 FINDINGS: No fracture or dislocation is seen. The joint spaces are preserved. Visualized soft tissues are within normal limits. Small suprapatellar knee joint effusion. IMPRESSION: No fracture or dislocation is seen. Small suprapatellar knee joint effusion. Electronically Signed   By: Julian Hy M.D.   On: 05/22/2022 19:59    Microbiology: Results for orders placed or performed during the hospital encounter of 05/22/22  Resp panel by RT-PCR (RSV, Flu A&B, Covid) Anterior Nasal Swab     Status: None   Collection Time: 05/22/22  7:17 PM   Specimen: Anterior Nasal Swab  Result Value Ref Range Status   SARS Coronavirus 2 by RT PCR NEGATIVE NEGATIVE Final    Comment: (NOTE) SARS-CoV-2 target nucleic acids are NOT DETECTED.  The SARS-CoV-2 RNA is generally detectable in upper respiratory specimens during the acute phase of infection. The lowest concentration of SARS-CoV-2 viral copies this assay can detect is 138 copies/mL. A negative result does not preclude SARS-Cov-2 infection and should not be used as the sole basis for treatment or other patient management decisions. A negative result may occur with  improper specimen  collection/handling,  submission of specimen other than nasopharyngeal swab, presence of viral mutation(s) within the areas targeted by this assay, and inadequate number of viral copies(<138 copies/mL). A negative result must be combined with clinical observations, patient history, and epidemiological information. The expected result is Negative.  Fact Sheet for Patients:  EntrepreneurPulse.com.au  Fact Sheet for Healthcare Providers:  IncredibleEmployment.be  This test is no t yet approved or cleared by the [place] FDA and  has been authorized for detection and/or diagnosis of SARS-CoV-2 by FDA under an Emergency Use Authorization (EUA). This EUA will remain  in effect (meaning this test can be used) for the duration of the COVID-19 declaration under Section 564(b)(1) of the Act, 21 U.S.C.section 360bbb-3(b)(1), unless the authorization is terminated  or revoked sooner.       Influenza A by PCR NEGATIVE NEGATIVE Final   Influenza B by PCR NEGATIVE NEGATIVE Final    Comment: (NOTE) The Xpert Xpress SARS-CoV-2/FLU/RSV plus assay is intended as an aid in the diagnosis of influenza from Nasopharyngeal swab specimens and should not be used as a sole basis for treatment. Nasal washings and aspirates are unacceptable for Xpert Xpress SARS-CoV-2/FLU/RSV testing.  Fact Sheet for Patients: EntrepreneurPulse.com.au  Fact Sheet for Healthcare Providers: IncredibleEmployment.be  This test is not yet approved or cleared by the [place] FDA and has been authorized for detection and/or diagnosis of SARS-CoV-2 by FDA under an Emergency Use Authorization (EUA). This EUA will remain in effect (meaning this test can be used) for the duration of the COVID-19 declaration under Section 564(b)(1) of the Act, 21 U.S.C. section 360bbb-3(b)(1), unless the authorization is terminated or revoked.     Resp Syncytial  Virus by PCR NEGATIVE NEGATIVE Final    Comment: (NOTE) Fact Sheet for Patients: EntrepreneurPulse.com.au  Fact Sheet for Healthcare Providers: IncredibleEmployment.be  This test is not yet approved or cleared by the [place] FDA and has been authorized for detection and/or diagnosis of SARS-CoV-2 by FDA under an Emergency Use Authorization (EUA). This EUA will remain in effect (meaning this test can be used) for the duration of the COVID-19 declaration under Section 564(b)(1) of the Act, 21 U.S.C. section 360bbb-3(b)(1), unless the authorization is terminated or revoked.  Performed at [facility], [address] [place], [place] [address]   Urine Culture     Status: Abnormal   Collection Time: 05/22/22  7:17 PM   Specimen: Urine, Random  Result Value Ref Range Status   Specimen Description   Final    URINE, RANDOM Performed at [facility], [address] [place], [place] [address]    Special Requests   Final    NONE Performed at [facility], [address] [place], [place] [address]    Culture (A)  Final    >=100,000 COLONIES/mL ENTEROCOCCUS FAECALIS 10,000 COLONIES/mL ESCHERICHIA COLI    Report Status 05/25/2022 FINAL  Final   Organism ID, Bacteria ENTEROCOCCUS FAECALIS (A)  Final   Organism ID, Bacteria ESCHERICHIA COLI (A)  Final      Susceptibility   Escherichia coli - MIC*    AMPICILLIN >=32 RESISTANT Resistant     CEFAZOLIN <=4 SENSITIVE Sensitive     CEFEPIME <=0.12 SENSITIVE Sensitive     CEFTRIAXONE <=0.25 SENSITIVE Sensitive     CIPROFLOXACIN >=4 RESISTANT Resistant     GENTAMICIN <=1 SENSITIVE Sensitive     IMIPENEM <=0.25 SENSITIVE Sensitive     NITROFURANTOIN <=16 SENSITIVE Sensitive     TRIMETH/SULFA >=320 RESISTANT Resistant  AMPICILLIN/SULBACTAM 16 INTERMEDIATE Intermediate     PIP/TAZO <=4 SENSITIVE Sensitive     * 10,000 COLONIES/mL ESCHERICHIA COLI   Enterococcus  faecalis - MIC*    AMPICILLIN <=2 SENSITIVE Sensitive     NITROFURANTOIN <=16 SENSITIVE Sensitive     VANCOMYCIN 1 SENSITIVE Sensitive     * >=100,000 COLONIES/mL ENTEROCOCCUS FAECALIS  Resp panel by RT-PCR (RSV, Flu A&B, Covid) Anterior Nasal Swab     Status: Abnormal   Collection Time: 05/30/22  4:02 PM   Specimen: Anterior Nasal Swab  Result Value Ref Range Status   SARS Coronavirus 2 by RT PCR NEGATIVE NEGATIVE Final    Comment: (NOTE) SARS-CoV-2 target nucleic acids are NOT DETECTED.  The SARS-CoV-2 RNA is generally detectable in upper respiratory specimens during the acute phase of infection. The lowest concentration of SARS-CoV-2 viral copies this assay can detect is 138 copies/mL. A negative result does not preclude SARS-Cov-2 infection and should not be used as the sole basis for treatment or other patient management decisions. A negative result may occur with  improper specimen collection/handling, submission of specimen other than nasopharyngeal swab, presence of viral mutation(s) within the areas targeted by this assay, and inadequate number of viral copies(<138 copies/mL). A negative result must be combined with clinical observations, patient history, and epidemiological information. The expected result is Negative.  Fact Sheet for Patients:  EntrepreneurPulse.com.au  Fact Sheet for Healthcare Providers:  IncredibleEmployment.be  This test is no t yet approved or cleared by the [place] FDA and  has been authorized for detection and/or diagnosis of SARS-CoV-2 by FDA under an Emergency Use Authorization (EUA). This EUA will remain  in effect (meaning this test can be used) for the duration of the COVID-19 declaration under Section 564(b)(1) of the Act, 21 U.S.C.section 360bbb-3(b)(1), unless the authorization is terminated  or revoked sooner.       Influenza A by PCR POSITIVE (A) NEGATIVE Final   Influenza B by PCR  NEGATIVE NEGATIVE Final    Comment: (NOTE) The Xpert Xpress SARS-CoV-2/FLU/RSV plus assay is intended as an aid in the diagnosis of influenza from Nasopharyngeal swab specimens and should not be used as a sole basis for treatment. Nasal washings and aspirates are unacceptable for Xpert Xpress SARS-CoV-2/FLU/RSV testing.  Fact Sheet for Patients: EntrepreneurPulse.com.au  Fact Sheet for Healthcare Providers: IncredibleEmployment.be  This test is not yet approved or cleared by the [place] FDA and has been authorized for detection and/or diagnosis of SARS-CoV-2 by FDA under an Emergency Use Authorization (EUA). This EUA will remain in effect (meaning this test can be used) for the duration of the COVID-19 declaration under Section 564(b)(1) of the Act, 21 U.S.C. section 360bbb-3(b)(1), unless the authorization is terminated or revoked.     Resp Syncytial Virus by PCR NEGATIVE NEGATIVE Final    Comment: (NOTE) Fact Sheet for Patients: EntrepreneurPulse.com.au  Fact Sheet for Healthcare Providers: IncredibleEmployment.be  This test is not yet approved or cleared by the [place] FDA and has been authorized for detection and/or diagnosis of SARS-CoV-2 by FDA under an Emergency Use Authorization (EUA). This EUA will remain in effect (meaning this test can be used) for the duration of the COVID-19 declaration under Section 564(b)(1) of the Act, 21 U.S.C. section 360bbb-3(b)(1), unless the authorization is terminated or revoked.  Performed at [facility], [address] [place], [place] [address]     Labs: CBC: Recent Labs  Lab 06/03/22 0637  WBC 5.7  NEUTROABS 2.9  HGB 11.4*  HCT 36.1*  MCV 91.2  PLT 292*   Basic Metabolic Panel: Recent Labs  Lab 06/03/22 0637  NA 140  K 3.9  CL 106  CO2 30  GLUCOSE 73  BUN 18  CREATININE 1.16  CALCIUM 8.8*   Liver Function  Tests: No results for input(s): "AST", "ALT", "ALKPHOS", "BILITOT", "PROT", "ALBUMIN" in the last 168 hours. CBG: Recent Labs  Lab 06/06/22 0756 06/06/22 1142 06/06/22 1552 06/06/22 2117 06/07/22 0759  GLUCAP 104* 160* 149* 97 126*    Discharge time spent: greater than 30 minutes.  Signed: Lux Meaders Marry Guan, MD [facility] 06/07/2022

## 2022-06-07 NOTE — Progress Notes (Signed)
PT Cancellation Note  Patient Details Name: [name] MRN: [id number] DOB: 09-21-1945   Cancelled Treatment:    Reason Eval/Treat Not Completed: Other (comment). Pt's chart reviewed. Upon entry to room pt and spouse reporting plan to d/c home. Educated pt's spouse on progress with PT needing maxA+2 just to attempt standing at the EOB. Pt and spouse would need hoyer lift for safe OOB transfers and education on safe use. Dicussed with pt and pt's spouse on right to appeal SNF denial as pt clearly not at his baseline and going home places pt at serious risk for falls/injury and readmission. Discussed with TOC and attending MD. Will continue to follow pt and maintain at PT caseload in case pt and family plan to appeal.    Salem Caster. Fairly IV, PT, DPT Physical Therapist- [facility]  06/07/2022, 4:04 PM

## 2022-06-07 NOTE — Progress Notes (Signed)
CSW spoke with pts wife, Bethena Roys, to discuss the appeal process.  CSW explained to Bethena Roys that she will have to call the Medicare number that is listed on the IMM that was delivered to patient this morning by TOC.

## 2022-06-07 NOTE — Care Management Important Message (Signed)
Important Message  Patient Details  Name: Adam Merritt MRN: [id number] Date of Birth: Jan 07, 1946   Medicare Important Message Given:  Yes     Juliann Pulse A Raenah Murley 06/07/2022, 10:49 AM

## 2022-06-08 LAB — GLUCOSE, CAPILLARY
Glucose-Capillary: 116 mg/dL — ABNORMAL HIGH (ref 70–99)
Glucose-Capillary: 138 mg/dL — ABNORMAL HIGH (ref 70–99)

## 2022-06-08 NOTE — Progress Notes (Signed)
Occupational Therapy Re-Evaluation Patient Details Name: Adam Merritt MRN: [id number] DOB: 1945-08-14 Today's Date: 06/08/2022   History of present illness Pt is a 64yoM who comes to [facility] 12/[facility] after a fall. PMH includes: DM2, CKD 3a, CAD, HTN, lymphedema, diabetic neuropathy, GERD, venous insufficiency with BLE ulceratons. 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 He has more recent BUE tremor in past few months. MD assessment includes: acute UTI, influenza, syncope, generalized weakness, and hypokalemia.   OT comments  Mr. Kaufhold was seen for OT/PT co-tx and re-evaluation this date. Pt recently denied SNF authorization by insurance and is planning for DC home with with spouse to provide 24/7 assist. Pt continues to require MAX-TOTAL A for ADL management and bed/functional mobility. On this date he requires MAX +2 for rolling R<>L in bed for placement of hoyer sling. Spouse educated on safety with hoyer transfer as therapy is recommending mechanical lift for home use as pt is unable to safely transfer without significant assist. Pt continues to present with significant deficits in ADL performance 2/2 problems listed below (See OT problem list) continues to benefit from skilled OT services to maximize safety, return to PLOF, and minimize caregiver burden. STR remains most appropriate DC venue. Will continue to follow POC as written.    Recommendations for follow up therapy are one component of a multi-disciplinary discharge planning process, led by the attending physician.  Recommendations may be updated based on patient status, additional functional criteria and insurance authorization.    Follow Up Recommendations  Skilled nursing-short term rehab (<3 hours/day)     Assistance Recommended at Discharge Frequent or constant Supervision/Assistance  Patient can return home with the following  Two people to help with walking and/or transfers;Two people to help with bathing/dressing/bathroom;Assistance  with cooking/housework;Assist for transportation;Help with stairs or ramp for entrance;Assistance with feeding   Equipment Recommendations  None recommended by OT    Recommendations for Other Services      Precautions / Restrictions Precautions Precautions: Fall Restrictions Weight Bearing Restrictions: No       Mobility Bed Mobility Overal bed mobility: Needs Assistance Bed Mobility: Rolling Rolling: Max assist, +2 for physical assistance              Transfers Overall transfer level: Needs assistance   Transfers: Bed to chair/wheelchair/BSC Sit to Stand: Max assist, +2 physical assistance, From elevated surface Harrel Lemon)           General transfer comment: Educated spouse on sling placement, how to safely use hoyer lift for OOB transfers. Transfer via Lift Equipment:  Harrel Lemon)   Balance Overall balance assessment: Needs assistance Sitting-balance support: Feet supported, Bilateral upper extremity supported Sitting balance-Leahy Scale: Fair Sitting balance - Comments: once seated with UE/LE placement, able to sit statically with close supervision.     Standing balance-Leahy Scale: Zero Standing balance comment: Unable to stand                           ADL either performed or assessed with clinical judgement   ADL Overall ADL's : Needs assistance/impaired                                       General ADL Comments: Continues to require MAX-TOTAL all for all ADL management. Requires MAX A +2 for bed mobility and hoyer transfer this date. Spouse in room educated on  safety with hoyer transfer upon DC home.    Extremity/Trunk Assessment Upper Extremity Assessment Upper Extremity Assessment: Generalized weakness   Lower Extremity Assessment Lower Extremity Assessment: Generalized weakness        Vision Baseline Vision/History: 2 Legally blind Patient Visual Report: No change from baseline     Perception     Praxis       Cognition Arousal/Alertness: Awake/alert Behavior During Therapy: WFL for tasks assessed/performed Overall Cognitive Status: Within Functional Limits for tasks assessed                                          Exercises Other Exercises Other Exercises: Education on sling palcement, how to safely use hoyer lift in home setting. Home set up of equipment for adequate space for manual hoyer lift use at home. Importance of regular positional changes to maximize safety and skin integrity. Routines modificatons to support safety and functional independence during ADL management.    Shoulder Instructions       General Comments      Pertinent Vitals/ Pain       Pain Assessment Pain Assessment: No/denies pain  Home Living                                          Prior Functioning/Environment              Frequency  Min 2X/week        Progress Toward Goals  OT Goals(current goals can now be found in the care plan section)  Progress towards OT goals: Progressing toward goals  Acute Rehab OT Goals Patient Stated Goal: to go to rehab OT Goal Formulation: With patient/family Time For Goal Achievement: 06/22/22 Potential to Achieve Goals: [place] Discharge plan remains appropriate;Frequency remains appropriate    Co-evaluation    PT/OT/SLP Co-Evaluation/Treatment: Yes Reason for Co-Treatment: For patient/therapist safety;To address functional/ADL transfers PT goals addressed during session: Mobility/safety with mobility;Proper use of DME OT goals addressed during session: ADL's and self-care;Proper use of Adaptive equipment and DME      AM-PAC OT "6 Clicks" Daily Activity     Outcome Measure   Help from another person eating meals?: A Lot Help from another person taking care of personal grooming?: A Lot Help from another person toileting, which includes using toliet, bedpan, or urinal?: Total Help from another person bathing  (including washing, rinsing, drying)?: Total Help from another person to put on and taking off regular upper body clothing?: A Lot Help from another person to put on and taking off regular lower body clothing?: A Lot 6 Click Score: 10    End of Session Equipment Utilized During Treatment: Oxygen  OT Visit Diagnosis: Other abnormalities of gait and mobility (R26.89);Muscle weakness (generalized) (M62.81) Pain - Right/Left: Right Pain - part of body: Leg   Activity Tolerance Patient tolerated treatment well   Patient Left in bed;with call bell/phone within reach;with nursing/sitter in room   Nurse Communication Mobility status        Time: 1102-1130 OT Time Calculation (min): 28 min  Charges: OT General Charges $OT Visit: 1 Visit OT Evaluation $OT Re-eval: 1 Re-eval OT Treatments $Self Care/Home Management : 8-22 mins  Shara Blazing, M.S., OTR/L 06/08/22, 12:14 PM

## 2022-06-08 NOTE — Progress Notes (Cosign Needed)
    Durable Medical Equipment  (From admission, onward)           Start     Ordered   06/08/22 1144  For home use only DME Other see comment  Once       Comments: Patient requires Harrel Lemon Lift for home DME use  Question:  Length of Need  Answer:  12 Months   06/08/22 1143   06/06/22 0000  For home use only DME Hospital bed       Question Answer Comment  Length of Need Lifetime   Bed type Semi-electric      06/06/22 1551   06/06/22 0000  DME Bedside commode       Question:  Patient needs a bedside commode to treat with the following condition  Answer:  Weakness generalized   06/06/22 1551

## 2022-06-08 NOTE — Progress Notes (Signed)
Physical Therapy Treatment Patient Details Name: Adam Merritt MRN: [id number] DOB: 05/17/1946 Today's Date: 06/08/2022   History of Present Illness Pt is a 4yoM who comes to [facility] 12/18 after a fall. PMH includes: DM2, CKD 3a, CAD, HTN, lymphedema, diabetic neuropathy, GERD, venous insufficiency with BLE ulceratons. 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 He has more recent BUE tremor in past few months. MD assessment includes: acute UTI, influenza, syncope, generalized weakness, and hypokalemia.    PT Comments    Pt received upright in bed agreeable to PT/OT co-treat. Spouse present at bedside. Reports was told about appeal process for SNF placement but is not pursuing with plan to D/c home. Purpose of session is to educate spouse on safe use of hoyer lift for OOB mobility. Educated pt on possible living room placement for hospital bed and hoyer lift for adequate space to maneuver. Also educated pt to not attempt until [facility] services arrives to assist pt and spouse as hoyer lift at hospital will differ from manual hoyer lift provided at home. Pt reliant on maxA+2 to roll in bed for sling placement with education to spouse on placement of hoyer, locking brakes of hoyer, and how/where to place straps to sling on hoyer lift. Pt then transferred from bed to recliner safely without complication. All needs in reach. RN aware and plans to re-educate spouse on hoyer use when pt transfers back to bed prior to EMS arrival. Still encourage STR placement as pt has limited family support due to work and use of hoyer will still require 2 person use. Pt is at high risk for injury in home setting currently and is at high risk for re-admission.     Recommendations for follow up therapy are one component of a multi-disciplinary discharge planning process, led by the attending physician.  Recommendations may be updated based on patient status, additional functional criteria and insurance authorization.  Follow Up  Recommendations  Skilled nursing-short term rehab (<3 hours/day) Can patient physically be transported by private vehicle: No   Assistance Recommended at Discharge Frequent or constant Supervision/Assistance  Patient can return home with the following Two people to help with walking and/or transfers;Two people to help with bathing/dressing/bathroom;Assistance with cooking/housework;Assistance with feeding;Direct supervision/assist for medications management;Assist for transportation;Help with stairs or ramp for entrance   Equipment Recommendations  Other (comment) (TBD. If going home recommended manual hoyer lift.)    Recommendations for Other Services       Precautions / Restrictions Precautions Precautions: Fall Restrictions Weight Bearing Restrictions: No     Mobility  Bed Mobility Overal bed mobility: Needs Assistance   Rolling: Max assist, +2 for physical assistance           Patient Response: Cooperative  Transfers Overall transfer level: Needs assistance   Transfers: Bed to chair/wheelchair/BSC             General transfer comment: Educated spouse on sling placement, how to safely use hoyer lift for OOB transfers. Transfer via Lift Equipment:  Harrel Lemon lift)  Ambulation/Gait                   Stairs             Wheelchair Mobility    Modified Rankin (Stroke Patients Only)       Balance  Cognition Arousal/Alertness: Awake/alert Behavior During Therapy: WFL for tasks assessed/performed Overall Cognitive Status: Within Functional Limits for tasks assessed                                          Exercises Other Exercises Other Exercises: Education on sling palcement, how to safely use hoyer lift in home setting. Home set up of equipment for adequate space for manual hoyer lift use at home.    General Comments        Pertinent Vitals/Pain Pain  Assessment Pain Assessment: No/denies pain    Home Living                          Prior Function            PT Goals (current goals can now be found in the care plan section) Acute Rehab PT Goals Patient Stated Goal: find a safe way to return to home PT Goal Formulation: With patient Time For Goal Achievement: 06/20/22 Potential to Achieve Goals: Fair Progress towards PT goals: Progressing toward goals    Frequency    Min 2X/week      PT Plan Current plan remains appropriate    Co-evaluation PT/OT/SLP Co-Evaluation/Treatment: Yes Reason for Co-Treatment: For patient/therapist safety;To address functional/ADL transfers PT goals addressed during session: Mobility/safety with mobility;Proper use of DME OT goals addressed during session: ADL's and self-care;Proper use of Adaptive equipment and DME      AM-PAC PT "6 Clicks" Mobility   Outcome Measure  Help needed turning from your back to your side while in a flat bed without using bedrails?: A Lot Help needed moving from lying on your back to sitting on the side of a flat bed without using bedrails?: A Lot Help needed moving to and from a bed to a chair (including a wheelchair)?: Total Help needed standing up from a chair using your arms (e.g., wheelchair or bedside chair)?: Total Help needed to walk in hospital room?: Total Help needed climbing 3-5 steps with a railing? : Total 6 Click Score: 8    End of Session Equipment Utilized During Treatment: Oxygen Activity Tolerance: Patient tolerated treatment well Patient left: in chair;with call bell/phone within reach;with chair alarm set;with family/visitor present Nurse Communication: Mobility status PT Visit Diagnosis: Other abnormalities of gait and mobility (R26.89);Muscle weakness (generalized) (M62.81)     Time: 7867-5449 PT Time Calculation (min) (ACUTE ONLY): 28 min  Charges:  $Self Care/Home Management: [place].  Fairly IV, PT, DPT Physical Therapist- [facility]  06/08/2022, 11:52 AM

## 2022-06-08 NOTE — Consult Note (Addendum)
[place] Nurse wound follow up Consult performed remotely after review of the progress notes.  Pt was previously wearing bilat Una boots to minimize edema and there are no wounds or drainage noted in the previous [place] consult note on 12/28. Una boots are no longer indicated; topical treatment orders provided for bedside nurses to perform as follows to minimize edema: remove/discontinue Una boots this morning, then apply bilat kerlex and ace wrap in a spiral fashion, beginning just behind toes to below knees and ace wrap in the same manner.   Change Q Thurs and Mon. Please re-consult if further assistance is needed.   Thank-you,  Julien Girt MSN, [place], [place], [place], [place]

## 2022-06-08 NOTE — Progress Notes (Signed)
D/C home via EMS. AVS printed and reviewed with wife Bethena Roys. All belongings gathered.

## 2022-06-08 NOTE — Plan of Care (Signed)
  Problem: Education: Goal: Ability to describe self-care measures that may prevent or decrease complications (Diabetes Survival Skills Education) will improve Outcome: Progressing Goal: Individualized Educational Video(s) Outcome: Progressing   Problem: Coping: Goal: Ability to adjust to condition or change in health will improve Outcome: Progressing   Problem: Fluid Volume: Goal: Ability to maintain a balanced intake and output will improve Outcome: Progressing   Problem: Health Behavior/Discharge Planning: Goal: Ability to identify and utilize available resources and services will improve Outcome: Progressing   Problem: Metabolic: Goal: Ability to maintain appropriate glucose levels will improve Outcome: Progressing   Problem: Nutritional: Goal: Maintenance of adequate nutrition will improve Outcome: Progressing Goal: Progress toward achieving an optimal weight will improve Outcome: Progressing   Problem: Skin Integrity: Goal: Risk for impaired skin integrity will decrease Outcome: Progressing   Problem: Tissue Perfusion: Goal: Adequacy of tissue perfusion will improve Outcome: Progressing   Problem: Education: Goal: Knowledge of General Education information will improve Description: Including pain rating scale, medication(s)/side effects and non-pharmacologic comfort measures Outcome: Progressing   Problem: Health Behavior/Discharge Planning: Goal: Ability to manage health-related needs will improve Outcome: Progressing   Problem: Clinical Measurements: Goal: Ability to maintain clinical measurements within normal limits will improve Outcome: Progressing Goal: Will remain free from infection Outcome: Progressing Goal: Diagnostic test results will improve Outcome: Progressing Goal: Respiratory complications will improve Outcome: Progressing Goal: Cardiovascular complication will be avoided Outcome: Progressing   Problem: Activity: Goal: Risk for activity  intolerance will decrease Outcome: Progressing   Problem: Elimination: Goal: Will not experience complications related to bowel motility Outcome: Progressing Goal: Will not experience complications related to urinary retention Outcome: Progressing   Problem: Pain Managment: Goal: General experience of comfort will improve Outcome: Progressing

## 2022-06-09 DIAGNOSIS — N1831 Chronic kidney disease, stage 3a: Secondary | ICD-10-CM | POA: Diagnosis not present

## 2022-06-09 DIAGNOSIS — E1122 Type 2 diabetes mellitus with diabetic chronic kidney disease: Secondary | ICD-10-CM | POA: Diagnosis not present

## 2022-06-09 DIAGNOSIS — I8390 Asymptomatic varicose veins of unspecified lower extremity: Secondary | ICD-10-CM | POA: Diagnosis not present

## 2022-06-09 DIAGNOSIS — I2510 Atherosclerotic heart disease of native coronary artery without angina pectoris: Secondary | ICD-10-CM | POA: Diagnosis not present

## 2022-06-09 DIAGNOSIS — J101 Influenza due to other identified influenza virus with other respiratory manifestations: Secondary | ICD-10-CM | POA: Diagnosis not present

## 2022-06-09 DIAGNOSIS — I5030 Unspecified diastolic (congestive) heart failure: Secondary | ICD-10-CM | POA: Diagnosis not present

## 2022-06-09 DIAGNOSIS — N390 Urinary tract infection, site not specified: Secondary | ICD-10-CM | POA: Diagnosis not present

## 2022-06-09 DIAGNOSIS — I130 Hypertensive heart and chronic kidney disease with heart failure and stage 1 through stage 4 chronic kidney disease, or unspecified chronic kidney disease: Secondary | ICD-10-CM | POA: Diagnosis not present

## 2022-06-09 DIAGNOSIS — E1151 Type 2 diabetes mellitus with diabetic peripheral angiopathy without gangrene: Secondary | ICD-10-CM | POA: Diagnosis not present

## 2022-06-09 NOTE — TOC Transition Note (Signed)
Transition of Care [facility]) - CM/SW Discharge Note   Patient Details  Name: Adam Merritt MRN: [id number] Date of Birth: [birth year]  [facility] CM/SW Contact:  [name], LCSW Phone Number: 06/09/2022, 3:59 PM   Clinical Narrative:   Patient discharged home with DME equipment provided by [employer].  Patient to receive [facility] PT/OT/RN services from [facility].  Patient transported home by [facility] and discharge summary completed.    Final next level of care: Home w Home Health Services Barriers to Discharge: No Barriers Identified   Patient Goals and CMS Choice CMS Medicare.gov Compare Post Acute Care list provided to:: Patient Choice offered to / list presented to : Patient, Spouse, Adult Children  Discharge Placement                    Name of family member notified: Bethena Roys, wife    Discharge Plan and Services Additional resources added to the After Visit Summary for       Post Acute Care Choice:  (unknown at this time)          DME Arranged:  (hospital bed, hoyer lift) DME Agency: [facility], [facility], [place] Arranged: PT, RN, OT [facility] Agency: [place]        Social Determinants of Health (SDOH) Interventions SDOH Screenings   Food Insecurity: No Food Insecurity (05/23/2022)  Housing: Low Risk  (05/23/2022)  Transportation Needs: No Transportation Needs (05/23/2022)  Utilities: Not At Risk (05/23/2022)  Alcohol Screen: Low Risk  (02/28/2021)  Depression (PHQ2-9): Low Risk  (06/21/2021)  Financial Resource Strain: Low Risk  (04/10/2019)  Physical Activity: Inactive (04/10/2019)  Social Connections: Moderately Integrated (04/10/2019)  Stress: No Stress Concern Present (04/10/2019)  Tobacco Use: Low Risk  (05/23/2022)     Readmission Risk Interventions     No data to display

## 2022-06-12 ENCOUNTER — Telehealth: Payer: Self-pay

## 2022-06-12 ENCOUNTER — Telehealth: Payer: Medicare PPO | Admitting: Internal Medicine

## 2022-06-12 ENCOUNTER — Encounter: Payer: Self-pay | Admitting: Internal Medicine

## 2022-06-12 ENCOUNTER — Ambulatory Visit: Payer: Medicare PPO | Admitting: Internal Medicine

## 2022-06-12 VITALS — Resp 16

## 2022-06-12 DIAGNOSIS — E1165 Type 2 diabetes mellitus with hyperglycemia: Secondary | ICD-10-CM

## 2022-06-12 DIAGNOSIS — I8390 Asymptomatic varicose veins of unspecified lower extremity: Secondary | ICD-10-CM | POA: Diagnosis not present

## 2022-06-12 DIAGNOSIS — I2510 Atherosclerotic heart disease of native coronary artery without angina pectoris: Secondary | ICD-10-CM | POA: Diagnosis not present

## 2022-06-12 DIAGNOSIS — Z9189 Other specified personal risk factors, not elsewhere classified: Secondary | ICD-10-CM | POA: Diagnosis not present

## 2022-06-12 DIAGNOSIS — L89309 Pressure ulcer of unspecified buttock, unspecified stage: Secondary | ICD-10-CM | POA: Diagnosis not present

## 2022-06-12 DIAGNOSIS — R531 Weakness: Secondary | ICD-10-CM

## 2022-06-12 DIAGNOSIS — N390 Urinary tract infection, site not specified: Secondary | ICD-10-CM | POA: Diagnosis not present

## 2022-06-12 DIAGNOSIS — J101 Influenza due to other identified influenza virus with other respiratory manifestations: Secondary | ICD-10-CM

## 2022-06-12 DIAGNOSIS — E1151 Type 2 diabetes mellitus with diabetic peripheral angiopathy without gangrene: Secondary | ICD-10-CM | POA: Diagnosis not present

## 2022-06-12 DIAGNOSIS — N1831 Chronic kidney disease, stage 3a: Secondary | ICD-10-CM | POA: Diagnosis not present

## 2022-06-12 DIAGNOSIS — I130 Hypertensive heart and chronic kidney disease with heart failure and stage 1 through stage 4 chronic kidney disease, or unspecified chronic kidney disease: Secondary | ICD-10-CM | POA: Diagnosis not present

## 2022-06-12 DIAGNOSIS — E1122 Type 2 diabetes mellitus with diabetic chronic kidney disease: Secondary | ICD-10-CM | POA: Diagnosis not present

## 2022-06-12 DIAGNOSIS — I5030 Unspecified diastolic (congestive) heart failure: Secondary | ICD-10-CM | POA: Diagnosis not present

## 2022-06-12 NOTE — Telephone Encounter (Signed)
Gave [facility] from [facility] [phone] for physical therapy 2 times a week for 4 weeks and 1 times a 4 weeks  and  ask him if nurse can add medication management

## 2022-06-12 NOTE — Telephone Encounter (Signed)
Gave verbal order to [facility] [phone] for nursing once a week for 6 weeks and also for personal care

## 2022-06-12 NOTE — Progress Notes (Unsigned)
[facility] [place], [place] [address]  Internal MEDICINE  Telephone Visit  Patient Name: Adam Merritt  [id number]  [id number]  Date of Service: 06/13/2022 Transitional Care Management  I connected with the patient at 356 pm by telephone and verified the patients identity using two identifiers.   I discussed the limitations, risks, security and privacy concerns of performing an evaluation and management service by telephone and the availability of in person appointments. I also discussed with the patient that there may be a patient responsible charge related to the service.  The patient expressed understanding and agrees to proceed.    Chief Complaint  Patient presents with   Telephone Screen    Prefers telephone call: [phone] (wife's cell)   Telephone Assessment   Diabetes    Recently changed insulin    Gastroesophageal Reflux   Hyperlipidemia   Hypertension    HPI Wife is talking on his behalf Pt has been in and out of the hospital and rehab facilities, confused about medications. Multiple medications were stopped and switched at the time   Hospital Course: TOBY BREITHAUPT is a 77 y.o. 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  He as admitted to the hospital, seen by PT who recommends SNF. He as treated for FLU with Tamiflu completed a course on 12/31. He was also treated for UTI, completed course of Nitrofurantoin for E. Faecalis and E Coli UTI on 12/27. Due to hypoglycemia, home Lantus dose was decreased. He has since been stable and doing well, he wears nocturnal O2 at home. Unfortunately [employer] has denied Rehab so patient will go home today with [facility] once  DME is delivered. Plan of care was discussed with the patient and his wife over the phone. All questions answered and they are agreeable to DC home 06/07/2022 Pt is unable to get out of bed  Current Medication: Outpatient Encounter Medications as of 06/12/2022  Medication Sig   aspirin 325 MG EC tablet Take 325 mg by mouth daily.   atorvastatin (LIPITOR) 40 MG tablet Take 40 mg by mouth at bedtime.    carvedilol (COREG) 25 MG tablet Take 25 mg by mouth 2 (two) times daily with a meal.   Continuous Blood Gluc Sensor (FREESTYLE LIBRE 2 SENSOR) MISC 1 Device by Does not apply route every 14 (fourteen) days.   feeding supplement (ENSURE ENLIVE / ENSURE PLUS) LIQD Take 237 mLs by mouth 3 (three) times daily between meals.   gabapentin (NEURONTIN) 300 MG capsule Take 1 capsule (300 mg total) by mouth 2 (two) times daily.   Insulin Pen Needle (BD PEN NEEDLE NANO U/F) 32G X 4 MM MISC Use as directed with insulin DX e11.65   ipratropium-albuterol (DUONEB) 0.5-2.5 (3) MG/3ML SOLN Take 3 mLs by nebulization every 4 (four) hours as needed (for shortness of breath).   Multiple Vitamin (MULTIVITAMIN WITH MINERALS) TABS tablet Take 1 tablet by mouth daily.   omeprazole (PRILOSEC) 20 MG capsule Take 20 mg by mouth daily.   pentoxifylline (TRENTAL) 400 MG CR tablet TAKE 1 TABLET BY MOUTH THREE TIMES DAILY FOR CLAUDICATION   polyethylene glycol (MIRALAX / GLYCOLAX) 17 g packet Take 17 g  by mouth daily as needed for mild constipation or moderate constipation.   prazosin (MINIPRESS) 1 MG capsule TAKE THREE CAPSULES BY MOUTH EVERY EVENING FOR NIGHTMARES   sennosides-docusate sodium (SENOKOT-S) 8.6-50 MG tablet Take 1-2 tablets by mouth 2 (two) times daily.   sertraline (ZOLOFT) 100 MG tablet Take 100 mg by mouth daily.    traZODone (DESYREL) 150 MG tablet Take 0.5 tablets (75 mg total) by mouth at bedtime.   vitamin D3 (CHOLECALCIFEROL) 25 MCG tablet Take 1,000 Units by mouth daily.   [DISCONTINUED] ascorbic acid  (VITAMIN C) 500 MG tablet Take 1 tablet (500 mg total) by mouth daily.   [DISCONTINUED] cyanocobalamin 1000 MCG tablet Take 1 tablet (1,000 mcg total) by mouth daily.   [DISCONTINUED] docusate sodium (COLACE) 50 MG capsule Take 50 mg by mouth 2 (two) times daily.   [DISCONTINUED] folic acid (FOLVITE) 1 MG tablet Take 1 tablet (1 mg total) by mouth daily.   [DISCONTINUED] glucose blood test strip Use as instructed to check blood sugars twice daily   [DISCONTINUED] insulin glargine-yfgn (SEMGLEE) 100 UNIT/ML injection Inject 0.15 mLs (15 Units total) into the skin daily.   [DISCONTINUED] iron polysaccharides (NIFEREX) 150 MG capsule Take 1 capsule (150 mg total) by mouth daily.   [DISCONTINUED] OneTouch Delica Lancets 75T MISC Use twice daily diag e11.65   No facility-administered encounter medications on file as of 06/12/2022.    Surgical History: Past Surgical History:  Procedure Laterality Date   AMPUTATION TOE Right 06/23/2018   Procedure: AMPUTATION TOE;  Surgeon: Sharlotte Alamo, DPM;  Location: [facility] ORS;  Service: Podiatry;  Laterality: Right;   CHOLECYSTECTOMY     CORONARY ARTERY BYPASS GRAFT      Medical History: Past Medical History:  Diagnosis Date   Anemia    CAD (coronary artery disease)    Chest pain    Coronary artery disease    Diabetes mellitus without complication (HCC)    Diastolic CHF ([place])    Esophageal reflux    Herpes zoster without mention of complication    Hyperlipidemia    Hypertension    Insomnia    Lumbago    Lymphedema    Neuropathy in diabetes (HCC)    Obstructive chronic bronchitis without exacerbation    Other malaise and fatigue    Stroke ([place])    Varicose veins     Family History: Family History  Problem Relation Age of Onset   Diabetes Mother    Hyperlipidemia Mother    Hypertension Mother    Diabetes Father    Hyperlipidemia Father    Hypertension Father    CAD Father     Social History   Socioeconomic History   Marital status:  Married    Spouse name: Not on file   Number of children: Not on file   Years of education: Not on file   Highest education level: Not on file  Occupational History   Occupation: retired  Tobacco Use   Smoking status: Never   Smokeless tobacco: Never  Vaping Use   Vaping Use: Never used  Substance and Sexual Activity   Alcohol use: No    Comment: quit alcohol 30 years ago   Drug use: No   Sexual activity: Not Currently  Other Topics Concern   Not on file  Social History Narrative   Lives at home with wife, uses a wheelchair now   Social Determinants of Health   Financial Resource Strain: Low Risk  (04/10/2019)   Overall Financial Resource  Strain (CARDIA)    Difficulty of Paying Living Expenses: Not hard at all  Food Insecurity: No Food Insecurity (05/23/2022)   Hunger Vital Sign    Worried About Running Out of Food in the Last Year: Never true    Ran Out of Food in the Last Year: Never true  Transportation Needs: No Transportation Needs (05/23/2022)   PRAPARE - Hydrologist (Medical): No    Lack of Transportation (Non-Medical): No  Physical Activity: Inactive (04/10/2019)   Exercise Vital Sign    Days of Exercise per Week: 0 days    Minutes of Exercise per Session: 0 min  Stress: No Stress Concern Present (04/10/2019)   [place]    Feeling of Stress : Only a little  Social Connections: Moderately Integrated (04/10/2019)   Social Connection and Isolation Panel [NHANES]    Frequency of Communication with Friends and Family: More than three times a week    Frequency of Social Gatherings with Friends and Family: More than three times a week    Attends Religious Services: More than 4 times per year    Active Member of [employer] or Organizations: No    Attends Archivist Meetings: Never    Marital Status: Married  Human resources officer Violence: Not At Risk (05/23/2022)    Humiliation, Afraid, Rape, and Kick questionnaire    Fear of Current or Ex-Partner: No    Emotionally Abused: No    Physically Abused: No    Sexually Abused: No      Review of Systems  Vital Signs: Resp 16    Observation/Objective: Pt looked stable and in NAD    Assessment/Plan: 1. Influenza A This is resolved patient is feeling better  2. Generalized weakness Will need ongoing PT and OT at home  3. Pressure injury of skin of buttock, unspecified injury stage, unspecified laterality Patient will need wound care  4. Uncontrolled type 2 diabetes mellitus with hyperglycemia ([place]) His medications were switched at the hospital wife is confused we will put him back on Victoza and lower dose of Basaglar  5. At risk for readmission to hospital Even though his insurance has refused to admission to a rehab facility patient is at risk for readmission due to complicated medical problems  General Counseling: Mihran verbalizes understanding of the findings of today's phone visit and agrees with plan of treatment. I have discussed any further diagnostic evaluation that may be needed or ordered today. We also reviewed his medications today. he has been encouraged to call the office with any questions or concerns that should arise related to todays visit. All medical records are reviewed, complicated and high level care is needed       Time spent:45 Minutes    Dr [name] Internal medicine

## 2022-06-13 ENCOUNTER — Ambulatory Visit: Payer: Medicare PPO | Admitting: Internal Medicine

## 2022-06-13 ENCOUNTER — Encounter: Payer: Self-pay | Admitting: Internal Medicine

## 2022-06-13 ENCOUNTER — Other Ambulatory Visit: Payer: Self-pay

## 2022-06-13 VITALS — Resp 16 | Ht 66.0 in

## 2022-06-13 DIAGNOSIS — R531 Weakness: Secondary | ICD-10-CM

## 2022-06-13 DIAGNOSIS — E1165 Type 2 diabetes mellitus with hyperglycemia: Secondary | ICD-10-CM | POA: Diagnosis not present

## 2022-06-13 DIAGNOSIS — L8946 Pressure-induced deep tissue damage of contiguous site of back, buttock and hip: Secondary | ICD-10-CM

## 2022-06-13 DIAGNOSIS — I5032 Chronic diastolic (congestive) heart failure: Secondary | ICD-10-CM | POA: Diagnosis not present

## 2022-06-13 DIAGNOSIS — M25561 Pain in right knee: Secondary | ICD-10-CM

## 2022-06-13 MED ORDER — BASAGLAR KWIKPEN 100 UNIT/ML ~~LOC~~ SOPN: PEN_INJECTOR | SUBCUTANEOUS | 3 refills | Status: DC

## 2022-06-13 MED ORDER — FUROSEMIDE 20 MG PO TABS: 20.0000 mg | ORAL_TABLET | Freq: Every day | ORAL | 3 refills | Status: DC

## 2022-06-13 MED ORDER — ACCU-CHEK GUIDE VI STRP: ORAL_STRIP | 1 refills | Status: DC

## 2022-06-13 MED ORDER — ACCU-CHEK GUIDE W/DEVICE KIT: PACK | 0 refills | Status: DC

## 2022-06-13 MED ORDER — VICTOZA 18 MG/3ML ~~LOC~~ SOPN: 1.8000 mg | PEN_INJECTOR | Freq: Every day | SUBCUTANEOUS | 1 refills | Status: DC

## 2022-06-13 MED ORDER — ACCU-CHEK SOFTCLIX LANCETS MISC: 1 refills | Status: DC

## 2022-06-13 NOTE — Progress Notes (Signed)
[facility] [place], [place] [address]  Internal MEDICINE  Office Visit Note  Patient Name: Adam Merritt  [id number]  [id number]  Date of Service: 06/13/2022  Chief Complaint  Patient presents with   Follow-up    Sat with patient's wife to review updated medication list. Patient's wife has some questions regarding med list.   Diabetes   Gastroesophageal Reflux   Hypertension   Hyperlipidemia    HPI Wife is here in the office, pt is home, unable to come but available on the phone  This is a medication review and consult, she is confused as multiple medications were added, some stopped, not sure about the reasons He is getting home health, will need help with ADL's  Wife is the primary caregiver  C/o knee pain, had xrays done in the hospital, has small effusion, not sure about DVT    Current Medication: Outpatient Encounter Medications as of 06/13/2022  Medication Sig   aspirin 325 MG EC tablet Take 325 mg by mouth daily.   atorvastatin (LIPITOR) 40 MG tablet Take 40 mg by mouth at bedtime.    carvedilol (COREG) 25 MG tablet Take 25 mg by mouth 2 (two) times daily with a meal.   Continuous Blood Gluc Sensor (FREESTYLE LIBRE 2 SENSOR) MISC 1 Device by Does not apply route every 14 (fourteen) days.   feeding supplement (ENSURE ENLIVE / ENSURE PLUS) LIQD Take 237 mLs by mouth 3 (three) times daily between meals.   furosemide (LASIX) 20 MG tablet Take 1 tablet (20 mg total) by mouth daily.   gabapentin (NEURONTIN) 300 MG capsule Take 1 capsule (300 mg total) by mouth 2 (two) times daily.   glucose blood test strip Use as instructed to check blood sugars twice daily   Insulin Glargine (BASAGLAR KWIKPEN) 100 UNIT/ML Take 15 units in am   Insulin Pen Needle (BD PEN NEEDLE NANO U/F) 32G X 4 MM MISC Use as directed with insulin DX e11.65   ipratropium-albuterol (DUONEB) 0.5-2.5 (3) MG/3ML SOLN Take 3 mLs by nebulization every 4 (four) hours as needed (for  shortness of breath).   liraglutide (VICTOZA) 18 MG/3ML SOPN Inject 1.8 mg into the skin daily.   Multiple Vitamin (MULTIVITAMIN WITH MINERALS) TABS tablet Take 1 tablet by mouth daily.   omeprazole (PRILOSEC) 20 MG capsule Take 20 mg by mouth daily.   OneTouch Delica Lancets 72Z MISC Use twice daily diag e11.65   pentoxifylline (TRENTAL) 400 MG CR tablet TAKE 1 TABLET BY MOUTH THREE TIMES DAILY FOR CLAUDICATION   polyethylene glycol (MIRALAX / GLYCOLAX) 17 g packet Take 17 g by mouth daily as needed for mild constipation or moderate constipation.   prazosin (MINIPRESS) 1 MG capsule TAKE THREE CAPSULES BY MOUTH EVERY EVENING FOR NIGHTMARES   sennosides-docusate sodium (SENOKOT-S) 8.6-50 MG tablet Take 1-2 tablets by mouth 2 (two) times daily.   sertraline (ZOLOFT) 100 MG tablet Take 100 mg by mouth daily.    traZODone (DESYREL) 150 MG tablet Take 0.5 tablets (75 mg total) by mouth at bedtime.   vitamin D3 (CHOLECALCIFEROL) 25 MCG tablet Take 1,000 Units by mouth daily.   [DISCONTINUED] ascorbic acid (VITAMIN C) 500 MG tablet Take 1 tablet (500 mg total) by mouth daily.   [DISCONTINUED] cyanocobalamin 1000 MCG tablet Take 1 tablet (1,000 mcg total) by mouth daily.   [DISCONTINUED] docusate sodium (COLACE) 50 MG capsule Take 50 mg by mouth 2 (two) times daily.   [DISCONTINUED] folic acid (FOLVITE) 1 MG  tablet Take 1 tablet (1 mg total) by mouth daily.   [DISCONTINUED] insulin glargine-yfgn (SEMGLEE) 100 UNIT/ML injection Inject 0.15 mLs (15 Units total) into the skin daily.   [DISCONTINUED] iron polysaccharides (NIFEREX) 150 MG capsule Take 1 capsule (150 mg total) by mouth daily.   No facility-administered encounter medications on file as of 06/13/2022.    Surgical History: Past Surgical History:  Procedure Laterality Date   AMPUTATION TOE Right 06/23/2018   Procedure: AMPUTATION TOE;  Surgeon: Sharlotte Alamo, DPM;  Location: [facility] ORS;  Service: Podiatry;  Laterality: Right;   CHOLECYSTECTOMY      CORONARY ARTERY BYPASS GRAFT      Medical History: Past Medical History:  Diagnosis Date   Anemia    CAD (coronary artery disease)    Chest pain    Coronary artery disease    Diabetes mellitus without complication (HCC)    Diastolic CHF ([place])    Esophageal reflux    Herpes zoster without mention of complication    Hyperlipidemia    Hypertension    Insomnia    Lumbago    Lymphedema    Neuropathy in diabetes (HCC)    Obstructive chronic bronchitis without exacerbation    Other malaise and fatigue    Stroke ([place])    Varicose veins     Family History: Family History  Problem Relation Age of Onset   Diabetes Mother    Hyperlipidemia Mother    Hypertension Mother    Diabetes Father    Hyperlipidemia Father    Hypertension Father    CAD Father     Social History   Socioeconomic History   Marital status: Married    Spouse name: Not on file   Number of children: Not on file   Years of education: Not on file   Highest education level: Not on file  Occupational History   Occupation: retired  Tobacco Use   Smoking status: Never   Smokeless tobacco: Never  Vaping Use   Vaping Use: Never used  Substance and Sexual Activity   Alcohol use: No    Comment: quit alcohol 30 years ago   Drug use: No   Sexual activity: Not Currently  Other Topics Concern   Not on file  Social History Narrative   Lives at home with wife, uses a wheelchair now   Social Determinants of Health   Financial Resource Strain: Low Risk  (04/10/2019)   Overall Financial Resource Strain (CARDIA)    Difficulty of Paying Living Expenses: Not hard at all  Food Insecurity: No Food Insecurity (05/23/2022)   Hunger Vital Sign    Worried About Running Out of Food in the Last Year: Never true    Ran Out of Food in the Last Year: Never true  Transportation Needs: No Transportation Needs (05/23/2022)   PRAPARE - Hydrologist (Medical): No    Lack of Transportation  (Non-Medical): No  Physical Activity: Inactive (04/10/2019)   Exercise Vital Sign    Days of Exercise per Week: 0 days    Minutes of Exercise per Session: 0 min  Stress: No Stress Concern Present (04/10/2019)   [place]    Feeling of Stress : Only a little  Social Connections: Moderately Integrated (04/10/2019)   Social Connection and Isolation Panel [NHANES]    Frequency of Communication with Friends and Family: More than three times a week    Frequency of Social Gatherings with Friends  and Family: More than three times a week    Attends Religious Services: More than 4 times per year    Active Member of Clubs or Organizations: No    Attends Archivist Meetings: Never    Marital Status: Married  Human resources officer Violence: Not At Risk (05/23/2022)   Humiliation, Afraid, Rape, and Kick questionnaire    Fear of Current or Ex-Partner: No    Emotionally Abused: No    Physically Abused: No    Sexually Abused: No      Review of Systems  Constitutional:  Negative for fatigue and fever.  HENT:  Negative for congestion, mouth sores and postnasal drip.   Respiratory:  Negative for cough.   Cardiovascular:  Negative for chest pain.  Genitourinary:  Negative for flank pain.  Skin:  Positive for wound.  Psychiatric/Behavioral:  Positive for confusion.     Vital Signs: Resp 16   Ht '5\' 6"'$  (1.676 m)   BMI 46.65 kg/m    Physical Exam No exam is done     Assessment/Plan: 1. Uncontrolled type 2 diabetes mellitus with hyperglycemia (HCC) Restart insulin but low dose is given, restart Victoza as well  - Insulin Glargine (BASAGLAR KWIKPEN) 100 UNIT/ML; Take 15 units in am  Dispense: 6 mL; Refill: 3 - liraglutide (VICTOZA) 18 MG/3ML SOPN; Inject 1.8 mg into the skin daily.  Dispense: 9 mL; Refill: 1  2. Acute pain of right knee Wife is instructed to see ortho if pain worsens, might need need venous dopplers  -  Ambulatory referral to Orthopedic Surgery  3. Generalized weakness Continue PT at home   4. Pressure injury of deep tissue of contiguous region involving back and buttock, unspecified laterality Wound care by home health   5. Chronic diastolic CHF (congestive heart failure) (HCC) Restart low dose Lasix 20 mg po qd, DC spiro and K supplement  - furosemide (LASIX) 20 MG tablet; Take 1 tablet (20 mg total) by mouth daily.  Dispense: 90 tablet; Refill: 3   General Counseling: Muriel verbalizes understanding of the findings of todays visit and agrees with plan of treatment. I have discussed any further diagnostic evaluation that may be needed or ordered today. We also reviewed his medications today. he has been encouraged to call the office with any questions or concerns that should arise related to todays visit.    Orders Placed This Encounter  Procedures   Ambulatory referral to Orthopedic Surgery    Meds ordered this encounter  Medications   Insulin Glargine (BASAGLAR KWIKPEN) 100 UNIT/ML    Sig: Take 15 units in am    Dispense:  6 mL    Refill:  3   liraglutide (VICTOZA) 18 MG/3ML SOPN    Sig: Inject 1.8 mg into the skin daily.    Dispense:  9 mL    Refill:  1   furosemide (LASIX) 20 MG tablet    Sig: Take 1 tablet (20 mg total) by mouth daily.    Dispense:  90 tablet    Refill:  3    Total time spent:35 Minutes Time spent includes review of chart, medications, test results, and follow up plan with the patient.   [place] Controlled Substance Database was reviewed by me.   Dr [name] Internal medicine

## 2022-06-14 ENCOUNTER — Telehealth: Payer: Self-pay | Admitting: Internal Medicine

## 2022-06-14 DIAGNOSIS — I8390 Asymptomatic varicose veins of unspecified lower extremity: Secondary | ICD-10-CM | POA: Diagnosis not present

## 2022-06-14 DIAGNOSIS — E1122 Type 2 diabetes mellitus with diabetic chronic kidney disease: Secondary | ICD-10-CM | POA: Diagnosis not present

## 2022-06-14 DIAGNOSIS — I5030 Unspecified diastolic (congestive) heart failure: Secondary | ICD-10-CM | POA: Diagnosis not present

## 2022-06-14 DIAGNOSIS — J101 Influenza due to other identified influenza virus with other respiratory manifestations: Secondary | ICD-10-CM | POA: Diagnosis not present

## 2022-06-14 DIAGNOSIS — I130 Hypertensive heart and chronic kidney disease with heart failure and stage 1 through stage 4 chronic kidney disease, or unspecified chronic kidney disease: Secondary | ICD-10-CM | POA: Diagnosis not present

## 2022-06-14 DIAGNOSIS — E1151 Type 2 diabetes mellitus with diabetic peripheral angiopathy without gangrene: Secondary | ICD-10-CM | POA: Diagnosis not present

## 2022-06-14 DIAGNOSIS — I2510 Atherosclerotic heart disease of native coronary artery without angina pectoris: Secondary | ICD-10-CM | POA: Diagnosis not present

## 2022-06-14 DIAGNOSIS — N390 Urinary tract infection, site not specified: Secondary | ICD-10-CM | POA: Diagnosis not present

## 2022-06-14 DIAGNOSIS — N1831 Chronic kidney disease, stage 3a: Secondary | ICD-10-CM | POA: Diagnosis not present

## 2022-06-14 NOTE — Telephone Encounter (Signed)
Orthopedic Surgery referral sent via [facility] to [facility]. -Toni 

## 2022-06-15 DIAGNOSIS — E1122 Type 2 diabetes mellitus with diabetic chronic kidney disease: Secondary | ICD-10-CM | POA: Diagnosis not present

## 2022-06-15 DIAGNOSIS — E1151 Type 2 diabetes mellitus with diabetic peripheral angiopathy without gangrene: Secondary | ICD-10-CM | POA: Diagnosis not present

## 2022-06-15 DIAGNOSIS — I8390 Asymptomatic varicose veins of unspecified lower extremity: Secondary | ICD-10-CM | POA: Diagnosis not present

## 2022-06-15 DIAGNOSIS — I130 Hypertensive heart and chronic kidney disease with heart failure and stage 1 through stage 4 chronic kidney disease, or unspecified chronic kidney disease: Secondary | ICD-10-CM | POA: Diagnosis not present

## 2022-06-15 DIAGNOSIS — I2510 Atherosclerotic heart disease of native coronary artery without angina pectoris: Secondary | ICD-10-CM | POA: Diagnosis not present

## 2022-06-15 DIAGNOSIS — J101 Influenza due to other identified influenza virus with other respiratory manifestations: Secondary | ICD-10-CM | POA: Diagnosis not present

## 2022-06-15 DIAGNOSIS — N1831 Chronic kidney disease, stage 3a: Secondary | ICD-10-CM | POA: Diagnosis not present

## 2022-06-15 DIAGNOSIS — N390 Urinary tract infection, site not specified: Secondary | ICD-10-CM | POA: Diagnosis not present

## 2022-06-15 DIAGNOSIS — I5030 Unspecified diastolic (congestive) heart failure: Secondary | ICD-10-CM | POA: Diagnosis not present

## 2022-06-19 ENCOUNTER — Telehealth: Payer: Self-pay

## 2022-06-19 NOTE — Telephone Encounter (Signed)
Gave verbal to Occupational therapy [phone] for OT once a week for 8 weeks

## 2022-06-20 ENCOUNTER — Telehealth: Payer: Self-pay | Admitting: Internal Medicine

## 2022-06-20 DIAGNOSIS — I5030 Unspecified diastolic (congestive) heart failure: Secondary | ICD-10-CM | POA: Diagnosis not present

## 2022-06-20 DIAGNOSIS — E1151 Type 2 diabetes mellitus with diabetic peripheral angiopathy without gangrene: Secondary | ICD-10-CM | POA: Diagnosis not present

## 2022-06-20 DIAGNOSIS — J101 Influenza due to other identified influenza virus with other respiratory manifestations: Secondary | ICD-10-CM | POA: Diagnosis not present

## 2022-06-20 DIAGNOSIS — I2510 Atherosclerotic heart disease of native coronary artery without angina pectoris: Secondary | ICD-10-CM | POA: Diagnosis not present

## 2022-06-20 DIAGNOSIS — I130 Hypertensive heart and chronic kidney disease with heart failure and stage 1 through stage 4 chronic kidney disease, or unspecified chronic kidney disease: Secondary | ICD-10-CM | POA: Diagnosis not present

## 2022-06-20 DIAGNOSIS — N390 Urinary tract infection, site not specified: Secondary | ICD-10-CM | POA: Diagnosis not present

## 2022-06-20 DIAGNOSIS — E1122 Type 2 diabetes mellitus with diabetic chronic kidney disease: Secondary | ICD-10-CM | POA: Diagnosis not present

## 2022-06-20 DIAGNOSIS — N1831 Chronic kidney disease, stage 3a: Secondary | ICD-10-CM | POA: Diagnosis not present

## 2022-06-20 DIAGNOSIS — I8390 Asymptomatic varicose veins of unspecified lower extremity: Secondary | ICD-10-CM | POA: Diagnosis not present

## 2022-06-20 NOTE — Telephone Encounter (Signed)
Per Rudene Christians, referral has been closed due to patient not returning their calls to schedule appointment-Toni

## 2022-06-21 DIAGNOSIS — E1151 Type 2 diabetes mellitus with diabetic peripheral angiopathy without gangrene: Secondary | ICD-10-CM | POA: Diagnosis not present

## 2022-06-21 DIAGNOSIS — I5030 Unspecified diastolic (congestive) heart failure: Secondary | ICD-10-CM | POA: Diagnosis not present

## 2022-06-21 DIAGNOSIS — E1122 Type 2 diabetes mellitus with diabetic chronic kidney disease: Secondary | ICD-10-CM | POA: Diagnosis not present

## 2022-06-21 DIAGNOSIS — I2510 Atherosclerotic heart disease of native coronary artery without angina pectoris: Secondary | ICD-10-CM | POA: Diagnosis not present

## 2022-06-21 DIAGNOSIS — N390 Urinary tract infection, site not specified: Secondary | ICD-10-CM | POA: Diagnosis not present

## 2022-06-21 DIAGNOSIS — J101 Influenza due to other identified influenza virus with other respiratory manifestations: Secondary | ICD-10-CM | POA: Diagnosis not present

## 2022-06-21 DIAGNOSIS — I130 Hypertensive heart and chronic kidney disease with heart failure and stage 1 through stage 4 chronic kidney disease, or unspecified chronic kidney disease: Secondary | ICD-10-CM | POA: Diagnosis not present

## 2022-06-21 DIAGNOSIS — N1831 Chronic kidney disease, stage 3a: Secondary | ICD-10-CM | POA: Diagnosis not present

## 2022-06-21 DIAGNOSIS — I8390 Asymptomatic varicose veins of unspecified lower extremity: Secondary | ICD-10-CM | POA: Diagnosis not present

## 2022-06-22 DIAGNOSIS — N1831 Chronic kidney disease, stage 3a: Secondary | ICD-10-CM | POA: Diagnosis not present

## 2022-06-22 DIAGNOSIS — I8390 Asymptomatic varicose veins of unspecified lower extremity: Secondary | ICD-10-CM | POA: Diagnosis not present

## 2022-06-22 DIAGNOSIS — E1122 Type 2 diabetes mellitus with diabetic chronic kidney disease: Secondary | ICD-10-CM | POA: Diagnosis not present

## 2022-06-22 DIAGNOSIS — I130 Hypertensive heart and chronic kidney disease with heart failure and stage 1 through stage 4 chronic kidney disease, or unspecified chronic kidney disease: Secondary | ICD-10-CM | POA: Diagnosis not present

## 2022-06-22 DIAGNOSIS — I2510 Atherosclerotic heart disease of native coronary artery without angina pectoris: Secondary | ICD-10-CM | POA: Diagnosis not present

## 2022-06-22 DIAGNOSIS — I5030 Unspecified diastolic (congestive) heart failure: Secondary | ICD-10-CM | POA: Diagnosis not present

## 2022-06-22 DIAGNOSIS — J101 Influenza due to other identified influenza virus with other respiratory manifestations: Secondary | ICD-10-CM | POA: Diagnosis not present

## 2022-06-22 DIAGNOSIS — R269 Unspecified abnormalities of gait and mobility: Secondary | ICD-10-CM | POA: Diagnosis not present

## 2022-06-22 DIAGNOSIS — N390 Urinary tract infection, site not specified: Secondary | ICD-10-CM | POA: Diagnosis not present

## 2022-06-22 DIAGNOSIS — E1151 Type 2 diabetes mellitus with diabetic peripheral angiopathy without gangrene: Secondary | ICD-10-CM | POA: Diagnosis not present

## 2022-06-23 DIAGNOSIS — N1831 Chronic kidney disease, stage 3a: Secondary | ICD-10-CM | POA: Diagnosis not present

## 2022-06-23 DIAGNOSIS — I8390 Asymptomatic varicose veins of unspecified lower extremity: Secondary | ICD-10-CM | POA: Diagnosis not present

## 2022-06-23 DIAGNOSIS — I2510 Atherosclerotic heart disease of native coronary artery without angina pectoris: Secondary | ICD-10-CM | POA: Diagnosis not present

## 2022-06-23 DIAGNOSIS — J101 Influenza due to other identified influenza virus with other respiratory manifestations: Secondary | ICD-10-CM | POA: Diagnosis not present

## 2022-06-23 DIAGNOSIS — E1122 Type 2 diabetes mellitus with diabetic chronic kidney disease: Secondary | ICD-10-CM | POA: Diagnosis not present

## 2022-06-23 DIAGNOSIS — I5030 Unspecified diastolic (congestive) heart failure: Secondary | ICD-10-CM | POA: Diagnosis not present

## 2022-06-23 DIAGNOSIS — I130 Hypertensive heart and chronic kidney disease with heart failure and stage 1 through stage 4 chronic kidney disease, or unspecified chronic kidney disease: Secondary | ICD-10-CM | POA: Diagnosis not present

## 2022-06-23 DIAGNOSIS — N390 Urinary tract infection, site not specified: Secondary | ICD-10-CM | POA: Diagnosis not present

## 2022-06-23 DIAGNOSIS — E1151 Type 2 diabetes mellitus with diabetic peripheral angiopathy without gangrene: Secondary | ICD-10-CM | POA: Diagnosis not present

## 2022-06-26 DIAGNOSIS — J101 Influenza due to other identified influenza virus with other respiratory manifestations: Secondary | ICD-10-CM | POA: Diagnosis not present

## 2022-06-26 DIAGNOSIS — I2510 Atherosclerotic heart disease of native coronary artery without angina pectoris: Secondary | ICD-10-CM | POA: Diagnosis not present

## 2022-06-26 DIAGNOSIS — E1122 Type 2 diabetes mellitus with diabetic chronic kidney disease: Secondary | ICD-10-CM | POA: Diagnosis not present

## 2022-06-26 DIAGNOSIS — I130 Hypertensive heart and chronic kidney disease with heart failure and stage 1 through stage 4 chronic kidney disease, or unspecified chronic kidney disease: Secondary | ICD-10-CM | POA: Diagnosis not present

## 2022-06-26 DIAGNOSIS — I5030 Unspecified diastolic (congestive) heart failure: Secondary | ICD-10-CM | POA: Diagnosis not present

## 2022-06-26 DIAGNOSIS — E1151 Type 2 diabetes mellitus with diabetic peripheral angiopathy without gangrene: Secondary | ICD-10-CM | POA: Diagnosis not present

## 2022-06-26 DIAGNOSIS — N390 Urinary tract infection, site not specified: Secondary | ICD-10-CM | POA: Diagnosis not present

## 2022-06-26 DIAGNOSIS — N1831 Chronic kidney disease, stage 3a: Secondary | ICD-10-CM | POA: Diagnosis not present

## 2022-06-26 DIAGNOSIS — I8390 Asymptomatic varicose veins of unspecified lower extremity: Secondary | ICD-10-CM | POA: Diagnosis not present

## 2022-06-27 DIAGNOSIS — I5030 Unspecified diastolic (congestive) heart failure: Secondary | ICD-10-CM | POA: Diagnosis not present

## 2022-06-27 DIAGNOSIS — E1122 Type 2 diabetes mellitus with diabetic chronic kidney disease: Secondary | ICD-10-CM | POA: Diagnosis not present

## 2022-06-27 DIAGNOSIS — I8390 Asymptomatic varicose veins of unspecified lower extremity: Secondary | ICD-10-CM | POA: Diagnosis not present

## 2022-06-27 DIAGNOSIS — N390 Urinary tract infection, site not specified: Secondary | ICD-10-CM | POA: Diagnosis not present

## 2022-06-27 DIAGNOSIS — N1831 Chronic kidney disease, stage 3a: Secondary | ICD-10-CM | POA: Diagnosis not present

## 2022-06-27 DIAGNOSIS — J101 Influenza due to other identified influenza virus with other respiratory manifestations: Secondary | ICD-10-CM | POA: Diagnosis not present

## 2022-06-27 DIAGNOSIS — I130 Hypertensive heart and chronic kidney disease with heart failure and stage 1 through stage 4 chronic kidney disease, or unspecified chronic kidney disease: Secondary | ICD-10-CM | POA: Diagnosis not present

## 2022-06-27 DIAGNOSIS — I2510 Atherosclerotic heart disease of native coronary artery without angina pectoris: Secondary | ICD-10-CM | POA: Diagnosis not present

## 2022-06-27 DIAGNOSIS — E1151 Type 2 diabetes mellitus with diabetic peripheral angiopathy without gangrene: Secondary | ICD-10-CM | POA: Diagnosis not present

## 2022-06-28 DIAGNOSIS — I130 Hypertensive heart and chronic kidney disease with heart failure and stage 1 through stage 4 chronic kidney disease, or unspecified chronic kidney disease: Secondary | ICD-10-CM | POA: Diagnosis not present

## 2022-06-28 DIAGNOSIS — E1151 Type 2 diabetes mellitus with diabetic peripheral angiopathy without gangrene: Secondary | ICD-10-CM | POA: Diagnosis not present

## 2022-06-28 DIAGNOSIS — E1122 Type 2 diabetes mellitus with diabetic chronic kidney disease: Secondary | ICD-10-CM | POA: Diagnosis not present

## 2022-06-28 DIAGNOSIS — N1831 Chronic kidney disease, stage 3a: Secondary | ICD-10-CM | POA: Diagnosis not present

## 2022-06-28 DIAGNOSIS — I8390 Asymptomatic varicose veins of unspecified lower extremity: Secondary | ICD-10-CM | POA: Diagnosis not present

## 2022-06-28 DIAGNOSIS — J101 Influenza due to other identified influenza virus with other respiratory manifestations: Secondary | ICD-10-CM | POA: Diagnosis not present

## 2022-06-28 DIAGNOSIS — N390 Urinary tract infection, site not specified: Secondary | ICD-10-CM | POA: Diagnosis not present

## 2022-06-28 DIAGNOSIS — I2510 Atherosclerotic heart disease of native coronary artery without angina pectoris: Secondary | ICD-10-CM | POA: Diagnosis not present

## 2022-06-28 DIAGNOSIS — I5030 Unspecified diastolic (congestive) heart failure: Secondary | ICD-10-CM | POA: Diagnosis not present

## 2022-06-29 DIAGNOSIS — N390 Urinary tract infection, site not specified: Secondary | ICD-10-CM | POA: Diagnosis not present

## 2022-06-29 DIAGNOSIS — I2510 Atherosclerotic heart disease of native coronary artery without angina pectoris: Secondary | ICD-10-CM | POA: Diagnosis not present

## 2022-06-29 DIAGNOSIS — E1151 Type 2 diabetes mellitus with diabetic peripheral angiopathy without gangrene: Secondary | ICD-10-CM | POA: Diagnosis not present

## 2022-06-29 DIAGNOSIS — I8390 Asymptomatic varicose veins of unspecified lower extremity: Secondary | ICD-10-CM | POA: Diagnosis not present

## 2022-06-29 DIAGNOSIS — I5030 Unspecified diastolic (congestive) heart failure: Secondary | ICD-10-CM | POA: Diagnosis not present

## 2022-06-29 DIAGNOSIS — N1831 Chronic kidney disease, stage 3a: Secondary | ICD-10-CM | POA: Diagnosis not present

## 2022-06-29 DIAGNOSIS — I130 Hypertensive heart and chronic kidney disease with heart failure and stage 1 through stage 4 chronic kidney disease, or unspecified chronic kidney disease: Secondary | ICD-10-CM | POA: Diagnosis not present

## 2022-06-29 DIAGNOSIS — J101 Influenza due to other identified influenza virus with other respiratory manifestations: Secondary | ICD-10-CM | POA: Diagnosis not present

## 2022-06-29 DIAGNOSIS — E1122 Type 2 diabetes mellitus with diabetic chronic kidney disease: Secondary | ICD-10-CM | POA: Diagnosis not present

## 2022-07-03 ENCOUNTER — Telehealth: Payer: Self-pay | Admitting: Internal Medicine

## 2022-07-03 NOTE — Telephone Encounter (Signed)
Received referral form from [place]. Gave to [facility] for signature-nm

## 2022-07-04 ENCOUNTER — Telehealth: Payer: Self-pay

## 2022-07-04 DIAGNOSIS — N390 Urinary tract infection, site not specified: Secondary | ICD-10-CM | POA: Diagnosis not present

## 2022-07-04 DIAGNOSIS — E1151 Type 2 diabetes mellitus with diabetic peripheral angiopathy without gangrene: Secondary | ICD-10-CM | POA: Diagnosis not present

## 2022-07-04 DIAGNOSIS — I5030 Unspecified diastolic (congestive) heart failure: Secondary | ICD-10-CM | POA: Diagnosis not present

## 2022-07-04 DIAGNOSIS — I8390 Asymptomatic varicose veins of unspecified lower extremity: Secondary | ICD-10-CM | POA: Diagnosis not present

## 2022-07-04 DIAGNOSIS — I130 Hypertensive heart and chronic kidney disease with heart failure and stage 1 through stage 4 chronic kidney disease, or unspecified chronic kidney disease: Secondary | ICD-10-CM | POA: Diagnosis not present

## 2022-07-04 DIAGNOSIS — J101 Influenza due to other identified influenza virus with other respiratory manifestations: Secondary | ICD-10-CM | POA: Diagnosis not present

## 2022-07-04 DIAGNOSIS — N1831 Chronic kidney disease, stage 3a: Secondary | ICD-10-CM | POA: Diagnosis not present

## 2022-07-04 DIAGNOSIS — I2510 Atherosclerotic heart disease of native coronary artery without angina pectoris: Secondary | ICD-10-CM | POA: Diagnosis not present

## 2022-07-04 DIAGNOSIS — E1122 Type 2 diabetes mellitus with diabetic chronic kidney disease: Secondary | ICD-10-CM | POA: Diagnosis not present

## 2022-07-04 NOTE — Telephone Encounter (Signed)
Faxed order to [facility] for consult evaluation for hospice to [phone]

## 2022-07-05 ENCOUNTER — Telehealth: Payer: Self-pay

## 2022-07-05 NOTE — Telephone Encounter (Addendum)
[facility] hospice called that admitted  pt to hospice  and they will be his attending physicians

## 2022-07-07 DIAGNOSIS — J42 Unspecified chronic bronchitis: Secondary | ICD-10-CM | POA: Diagnosis not present

## 2022-07-20 IMAGING — CT CT CERVICAL SPINE W/O CM
3 of 4 series · 10 of 33 positions shown, 12 images · non-contrast
Comparison: None.

CLINICAL DATA: Trauma, fall



[Series 6: sagittal bone · sagittal · 0.26mm/px · 5 of 73 slices shown, 6 images]
[im 25/73  bone]
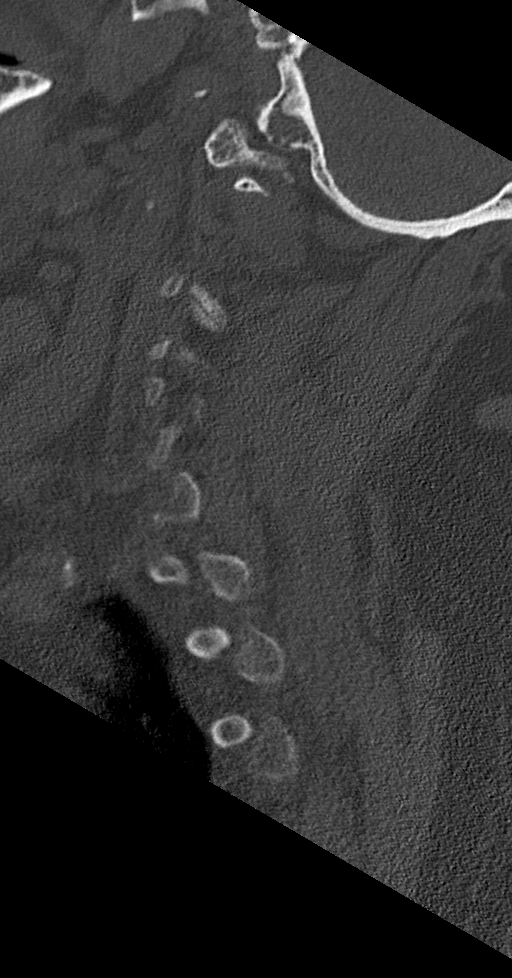
[im 31/73  bone]
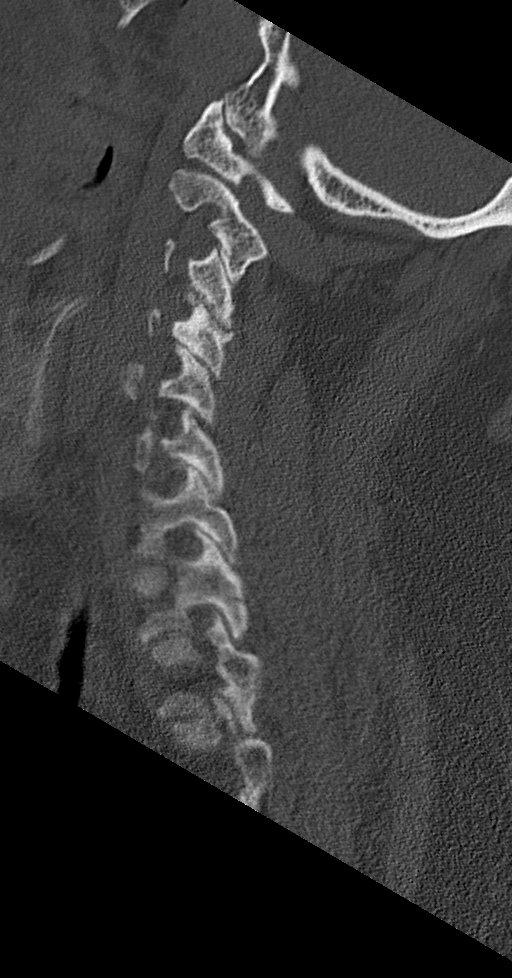
[im 37/73  soft-tissue]
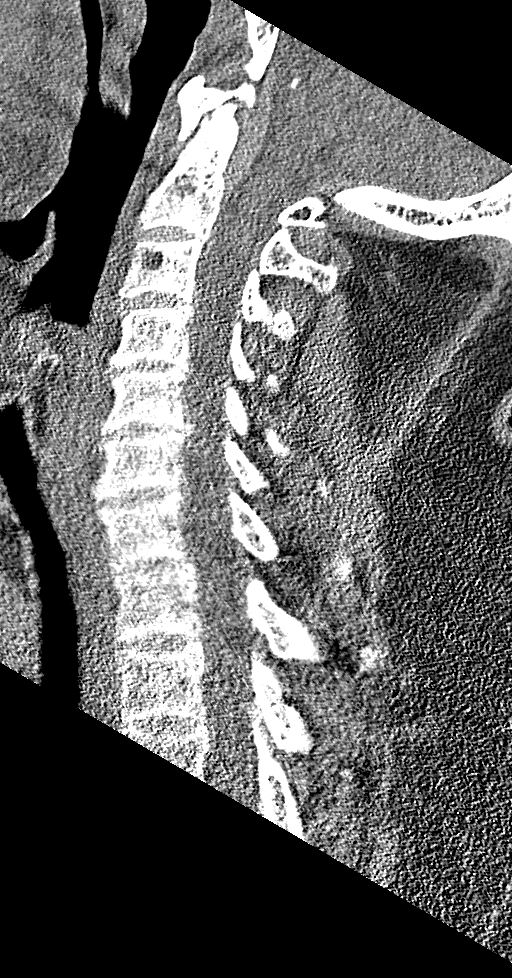
[im 37/73  bone]
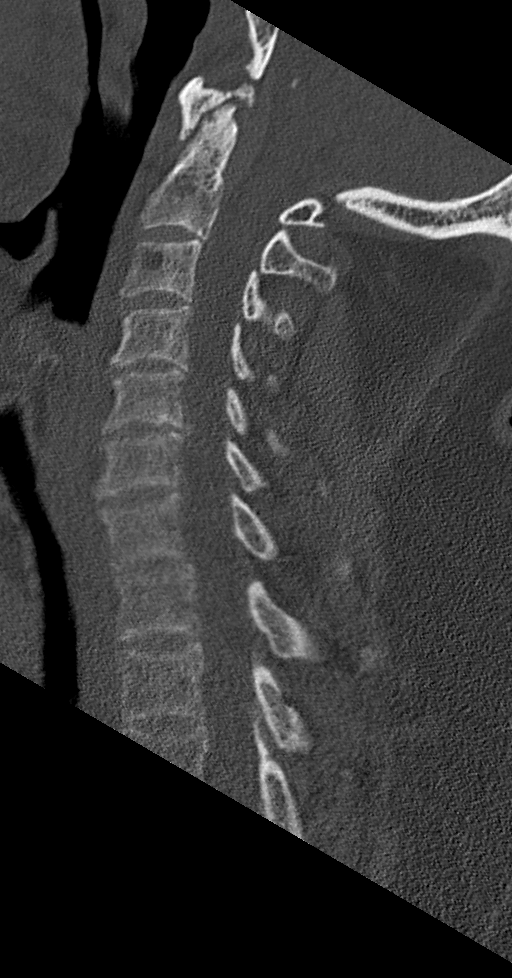
[im 43/73  bone]
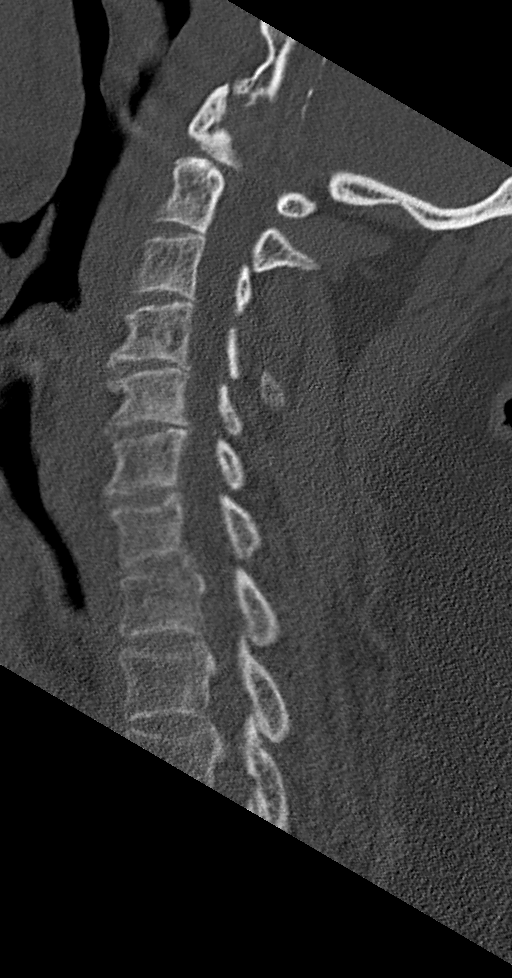
[im 49/73  bone]
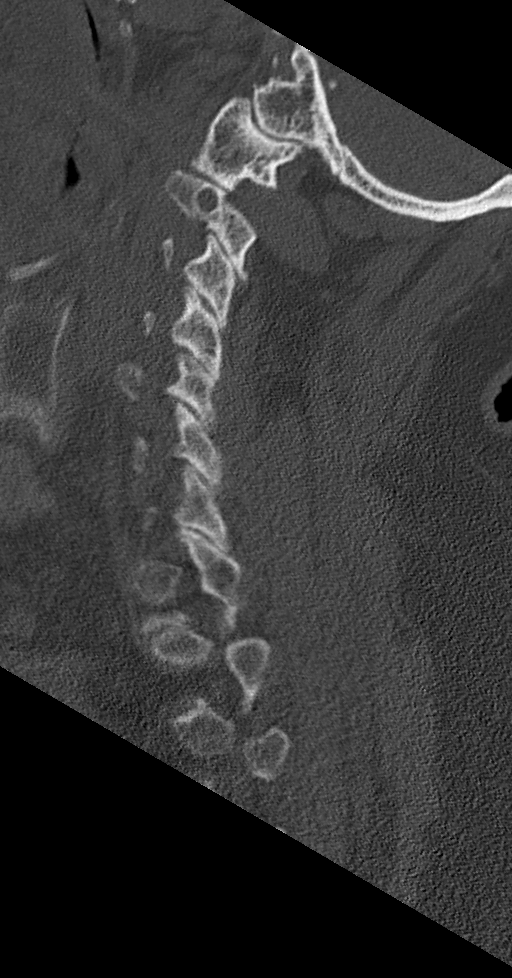

[Series 7: coronal bone · coronal · 0.28mm/px · 3 of 68 slices shown]
[im 19/68  bone]
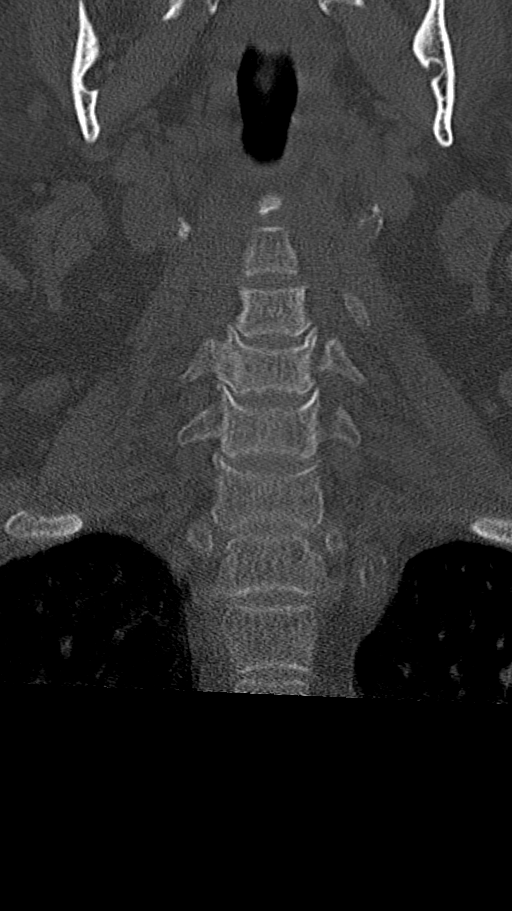
[im 29/68  bone]
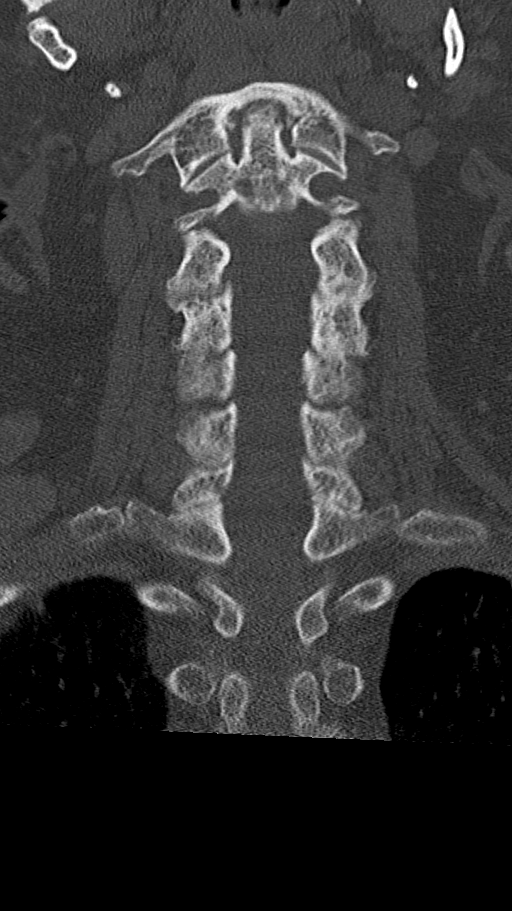
[im 39/68  bone]
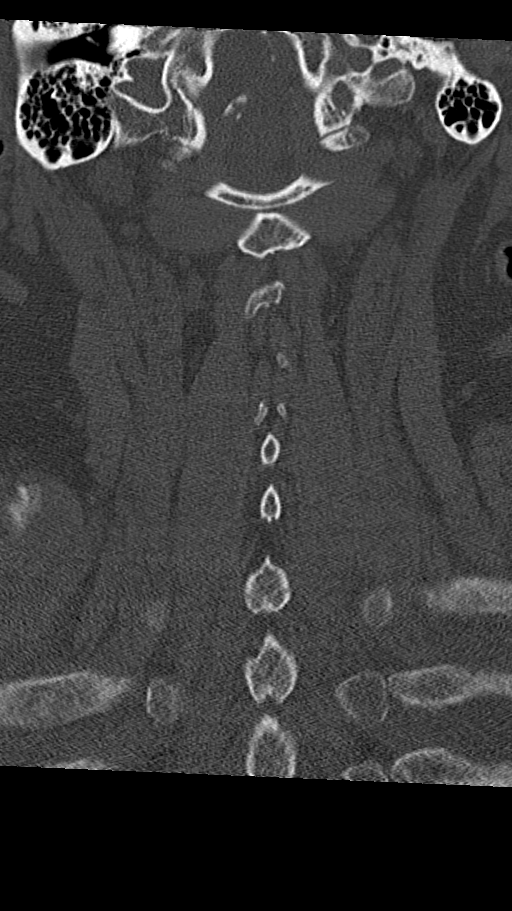

[Series 8: orthogonal bone · axial · 0.26mm/px · z∈[+451,+547]mm · 2 of 130 slices shown, 3 images]
[im 37/130  soft-tissue]
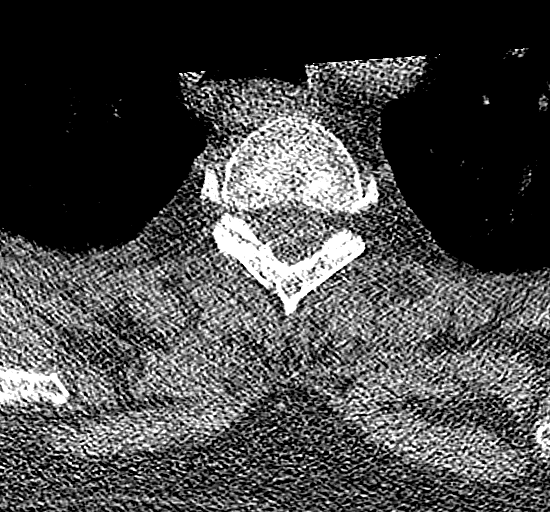
[im 37/130  bone]
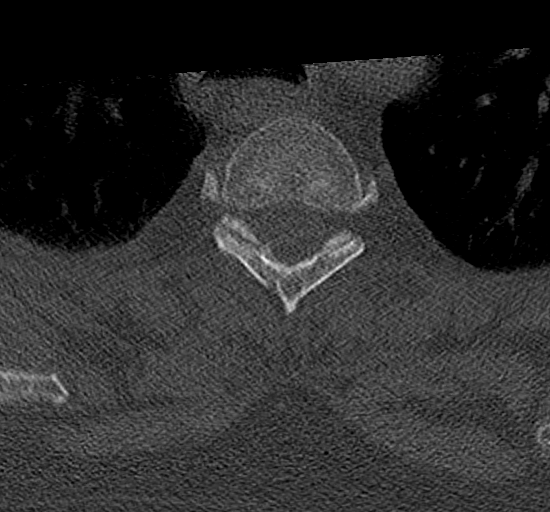
[im 93/130  bone]
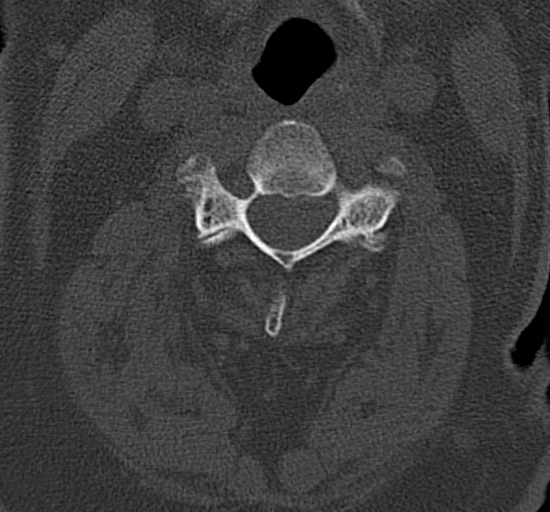

[10 of 33 positions shown; findings below may reference images not displayed]

FINDINGS: Alignment: Alignment of posterior margins of vertebral bodies is
within normal limits.

Skull base and vertebrae: No recent fracture is seen. There are few
small smooth marginated calcifications adjacent to the tip of
spinous process of C7 vertebra suggesting old avulsions or ununited
accessory ossification centers. Degenerative changes are noted with
bony spurs and disc space narrowing at multiple levels.

Soft tissues and spinal canal: There is mild spinal stenosis at
C5-C6 level caused by posterior bony spurs.

Disc levels: There is encroachment of neural foramina by bony spurs
from C3 to C6 levels.

Upper chest: Visualized upper lung fields are unremarkable. There is
narrowing of AP diameter of trachea suggesting spasm or
tracheomalacia.

Other: There are scattered arterial calcifications.
IMPRESSION: No recent fracture is seen in the cervical spine. Cervical
spondylosis with mild spinal stenosis at C5-C6 level. There is
encroachment of neural foramina by bony spurs from C3-C6 levels.

Other findings as described in the body of the report.

## 2022-07-20 IMAGING — CR DG CHEST 2V
1 series · 2 of 2 positions shown · non-contrast
Comparison: Chest radiograph 02/25/2020

CLINICAL DATA: Fall, shortness of breath, cough, history of chronic
bronchitis

EXAM:
CHEST - 2 VIEW

[Series 1: dg chest 2 view · 0.14mm/px · 2 of 2 slices shown]
[im 1/2]
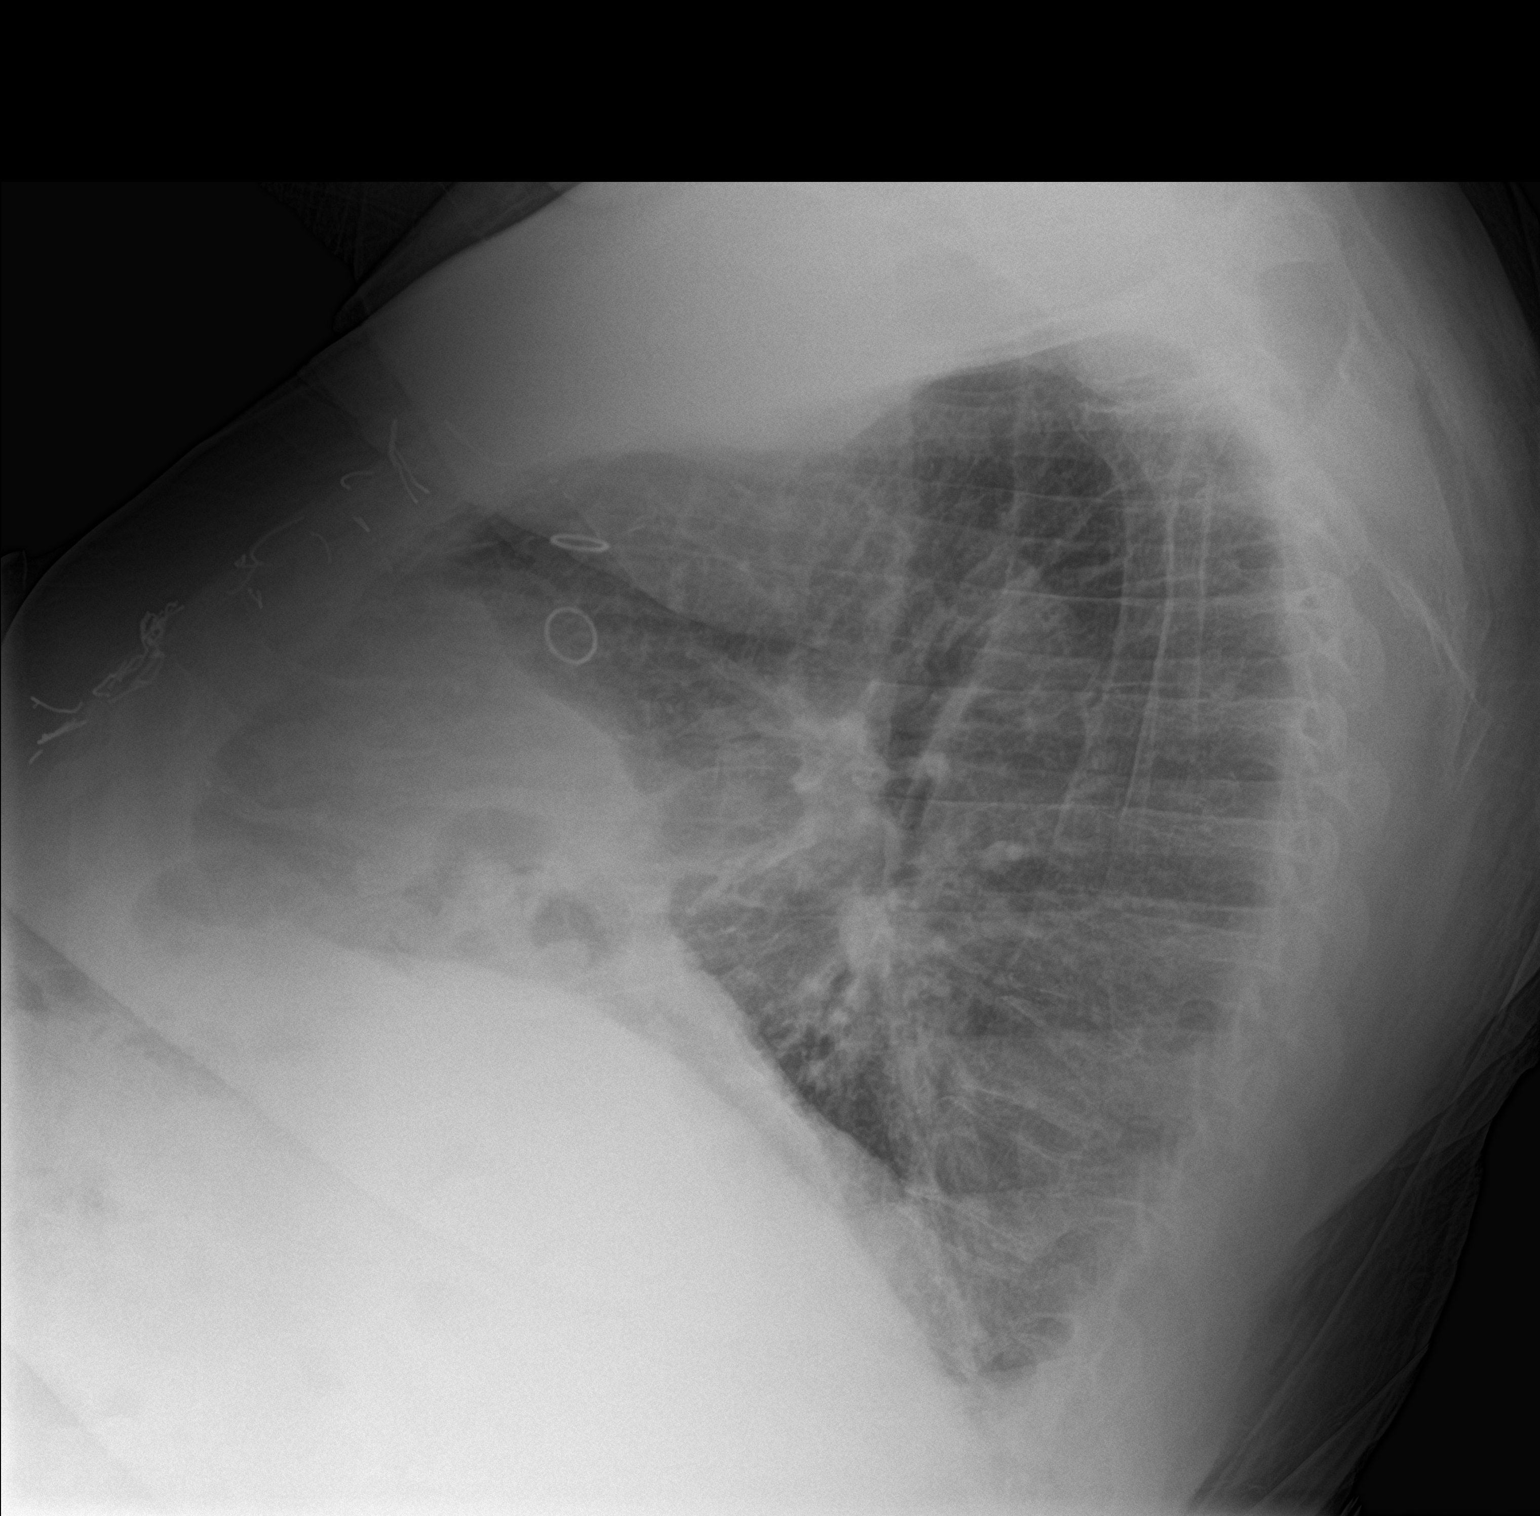
[im 2/2]
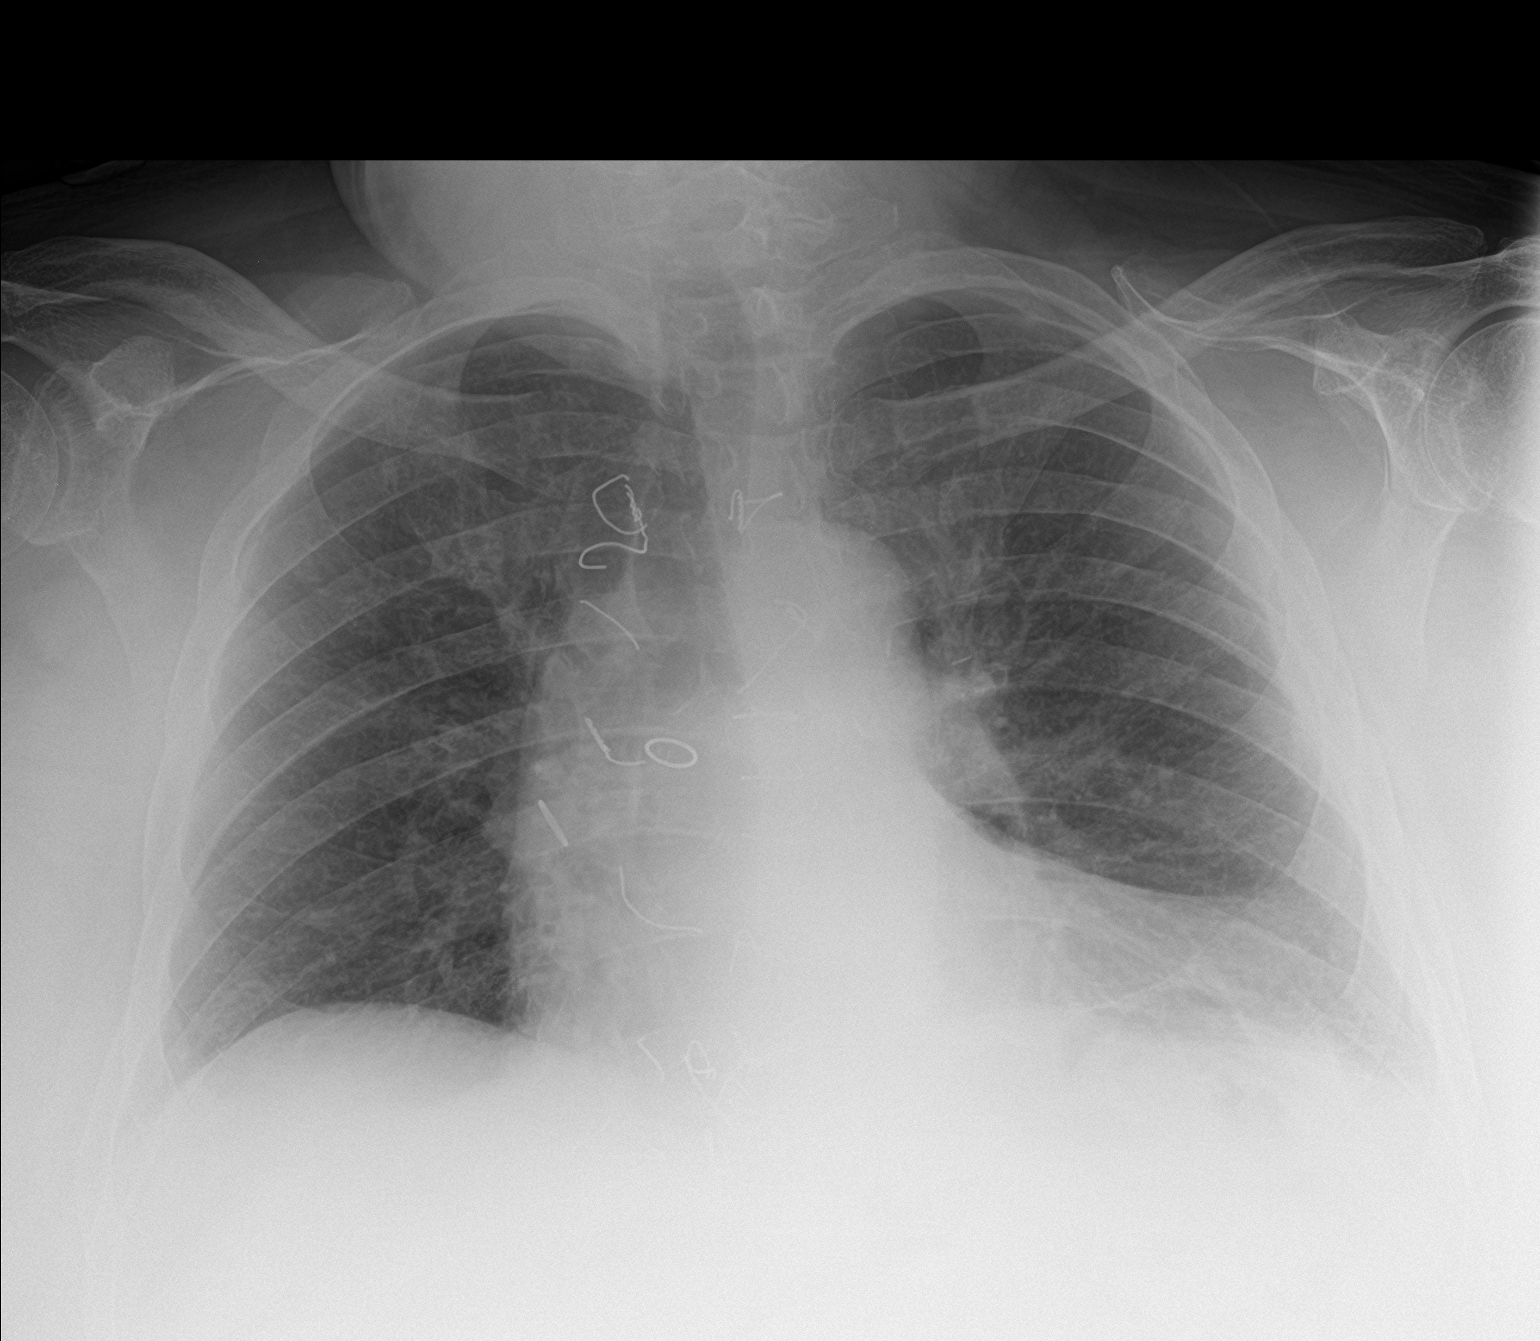

[2 of 2 positions shown; findings below may reference images not displayed]

FINDINGS: Numerous fractured median sternotomy wires and mediastinal surgical
clips are again seen. The heart is enlarged, unchanged. The
mediastinal contours are stable.

There is central peribronchial thickening likely reflecting chronic
bronchitis. Linear opacities in the lateral left base likely reflect
atelectasis or scar. Otherwise, there is no focal consolidation or
pulmonary edema. There is no pleural effusion or pneumothorax.

There is no acute osseous abnormality.
IMPRESSION: 1. Central peribronchial thickening likely reflects sequela of
chronic bronchitis. Otherwise, no radiographic evidence of acute
cardiopulmonary process.
2. Unchanged cardiomegaly.
3. Unchanged discontinuous sternotomy wires.

## 2022-07-26 ENCOUNTER — Ambulatory Visit: Payer: Medicare PPO | Admitting: Internal Medicine

## 2022-08-01 ENCOUNTER — Ambulatory Visit: Payer: Medicare PPO | Admitting: Internal Medicine

## 2022-08-14 ENCOUNTER — Telehealth: Payer: Self-pay | Admitting: Internal Medicine

## 2022-08-14 NOTE — Telephone Encounter (Signed)
Pt is in hospice care-nm

## 2022-08-23 ENCOUNTER — Other Ambulatory Visit: Payer: Self-pay | Admitting: Internal Medicine

## 2022-10-21 DIAGNOSIS — R269 Unspecified abnormalities of gait and mobility: Secondary | ICD-10-CM | POA: Diagnosis not present

## 2022-11-04 DEATH — deceased
